# Patient Record
Sex: Female | Born: 1935
Health system: Southern US, Community
[De-identification: ages and names within clinical notes are randomized; demographics above are authoritative.]

## PROBLEM LIST (undated history)

## (undated) DIAGNOSIS — S3282XA Multiple fractures of pelvis without disruption of pelvic ring, initial encounter for closed fracture: Secondary | ICD-10-CM

## (undated) DIAGNOSIS — R110 Nausea: Secondary | ICD-10-CM

## (undated) DIAGNOSIS — H269 Unspecified cataract: Secondary | ICD-10-CM

## (undated) DIAGNOSIS — K219 Gastro-esophageal reflux disease without esophagitis: Secondary | ICD-10-CM

## (undated) DIAGNOSIS — I2510 Atherosclerotic heart disease of native coronary artery without angina pectoris: Secondary | ICD-10-CM

## (undated) DIAGNOSIS — E785 Hyperlipidemia, unspecified: Secondary | ICD-10-CM

## (undated) DIAGNOSIS — Z860100 Personal history of colon polyps, unspecified: Secondary | ICD-10-CM

## (undated) DIAGNOSIS — M810 Age-related osteoporosis without current pathological fracture: Secondary | ICD-10-CM

## (undated) DIAGNOSIS — F419 Anxiety disorder, unspecified: Secondary | ICD-10-CM

## (undated) DIAGNOSIS — Z87898 Personal history of other specified conditions: Secondary | ICD-10-CM

## (undated) DIAGNOSIS — K449 Diaphragmatic hernia without obstruction or gangrene: Secondary | ICD-10-CM

## (undated) DIAGNOSIS — R945 Abnormal results of liver function studies: Secondary | ICD-10-CM

## (undated) DIAGNOSIS — Z5189 Encounter for other specified aftercare: Secondary | ICD-10-CM

## (undated) DIAGNOSIS — I10 Essential (primary) hypertension: Secondary | ICD-10-CM

## (undated) DIAGNOSIS — R112 Nausea with vomiting, unspecified: Secondary | ICD-10-CM

## (undated) DIAGNOSIS — G8929 Other chronic pain: Secondary | ICD-10-CM

## (undated) DIAGNOSIS — Z8709 Personal history of other diseases of the respiratory system: Secondary | ICD-10-CM

## (undated) DIAGNOSIS — K5900 Constipation, unspecified: Secondary | ICD-10-CM

## (undated) DIAGNOSIS — Z9889 Other specified postprocedural states: Secondary | ICD-10-CM

## (undated) DIAGNOSIS — G459 Transient cerebral ischemic attack, unspecified: Secondary | ICD-10-CM

## (undated) DIAGNOSIS — K5730 Diverticulosis of large intestine without perforation or abscess without bleeding: Secondary | ICD-10-CM

## (undated) DIAGNOSIS — R39198 Other difficulties with micturition: Secondary | ICD-10-CM

## (undated) DIAGNOSIS — N183 Chronic kidney disease, stage 3 (moderate): Secondary | ICD-10-CM

## (undated) DIAGNOSIS — M549 Dorsalgia, unspecified: Secondary | ICD-10-CM

## (undated) HISTORY — DX: Atherosclerotic heart disease of native coronary artery without angina pectoris: I25.10

## (undated) HISTORY — DX: Essential (primary) hypertension: I10

## (undated) HISTORY — DX: Encounter for other specified aftercare: Z51.89

## (undated) HISTORY — DX: Multiple fractures of pelvis without disruption of pelvic ring, initial encounter for closed fracture: S32.82XA

## (undated) HISTORY — DX: Diaphragmatic hernia without obstruction or gangrene: K44.9

## (undated) HISTORY — DX: Unspecified cataract: H26.9

## (undated) HISTORY — DX: Anxiety disorder, unspecified: F41.9

## (undated) HISTORY — DX: Hyperlipidemia, unspecified: E78.5

## (undated) HISTORY — PX: APPENDECTOMY: SHX54

## (undated) HISTORY — PX: HEMORRHOID SURGERY: SHX153

## (undated) HISTORY — DX: Gastro-esophageal reflux disease without esophagitis: K21.9

## (undated) HISTORY — PX: BLADDER REPAIR: SHX76

## (undated) HISTORY — PX: FIXATION KYPHOPLASTY LUMBAR SPINE: SHX1642

## (undated) HISTORY — PX: UPPER GASTROINTESTINAL ENDOSCOPY: SHX188

## (undated) HISTORY — PX: CHOLECYSTECTOMY: SHX55

## (undated) HISTORY — PX: BACK SURGERY: SHX140

---

## 1973-05-09 HISTORY — PX: TOTAL ABDOMINAL HYSTERECTOMY: SHX209

## 1993-05-09 DIAGNOSIS — G459 Transient cerebral ischemic attack, unspecified: Secondary | ICD-10-CM

## 1993-05-09 HISTORY — DX: Transient cerebral ischemic attack, unspecified: G45.9

## 1998-06-11 ENCOUNTER — Other Ambulatory Visit: Admission: RE | Admit: 1998-06-11 | Discharge: 1998-06-11 | Payer: Self-pay | Admitting: Obstetrics & Gynecology

## 1999-05-19 ENCOUNTER — Encounter: Payer: Self-pay | Admitting: Internal Medicine

## 1999-05-19 ENCOUNTER — Ambulatory Visit (HOSPITAL_COMMUNITY): Admission: RE | Admit: 1999-05-19 | Discharge: 1999-05-19 | Payer: Self-pay | Admitting: Internal Medicine

## 1999-05-20 ENCOUNTER — Encounter: Payer: Self-pay | Admitting: Emergency Medicine

## 1999-05-20 ENCOUNTER — Inpatient Hospital Stay (HOSPITAL_COMMUNITY): Admission: EM | Admit: 1999-05-20 | Discharge: 1999-05-22 | Payer: Self-pay | Admitting: Emergency Medicine

## 1999-08-31 ENCOUNTER — Other Ambulatory Visit: Admission: RE | Admit: 1999-08-31 | Discharge: 1999-08-31 | Payer: Self-pay | Admitting: Obstetrics and Gynecology

## 1999-09-01 ENCOUNTER — Encounter: Payer: Self-pay | Admitting: Obstetrics and Gynecology

## 1999-09-01 ENCOUNTER — Ambulatory Visit (HOSPITAL_COMMUNITY): Admission: RE | Admit: 1999-09-01 | Discharge: 1999-09-01 | Payer: Self-pay | Admitting: Obstetrics and Gynecology

## 2000-07-04 ENCOUNTER — Inpatient Hospital Stay (HOSPITAL_COMMUNITY): Admission: EM | Admit: 2000-07-04 | Discharge: 2000-07-08 | Payer: Self-pay | Admitting: Emergency Medicine

## 2000-07-04 ENCOUNTER — Encounter: Payer: Self-pay | Admitting: Emergency Medicine

## 2000-07-06 ENCOUNTER — Encounter: Payer: Self-pay | Admitting: Specialist

## 2000-07-28 ENCOUNTER — Encounter: Payer: Self-pay | Admitting: Specialist

## 2000-07-28 ENCOUNTER — Encounter: Admission: RE | Admit: 2000-07-28 | Discharge: 2000-07-28 | Payer: Self-pay | Admitting: Specialist

## 2000-10-18 ENCOUNTER — Encounter: Payer: Self-pay | Admitting: Emergency Medicine

## 2000-10-18 ENCOUNTER — Inpatient Hospital Stay (HOSPITAL_COMMUNITY): Admission: EM | Admit: 2000-10-18 | Discharge: 2000-10-20 | Payer: Self-pay | Admitting: Emergency Medicine

## 2000-11-06 HISTORY — PX: EYE SURGERY: SHX253

## 2000-12-26 ENCOUNTER — Ambulatory Visit (HOSPITAL_COMMUNITY): Admission: RE | Admit: 2000-12-26 | Discharge: 2000-12-26 | Payer: Self-pay | Admitting: Family Medicine

## 2000-12-26 ENCOUNTER — Encounter: Payer: Self-pay | Admitting: Gastroenterology

## 2000-12-26 ENCOUNTER — Encounter (INDEPENDENT_AMBULATORY_CARE_PROVIDER_SITE_OTHER): Payer: Self-pay | Admitting: Specialist

## 2001-05-29 ENCOUNTER — Encounter: Payer: Self-pay | Admitting: Internal Medicine

## 2001-05-29 ENCOUNTER — Ambulatory Visit (HOSPITAL_COMMUNITY): Admission: RE | Admit: 2001-05-29 | Discharge: 2001-05-29 | Payer: Self-pay | Admitting: Internal Medicine

## 2001-11-16 ENCOUNTER — Ambulatory Visit (HOSPITAL_COMMUNITY): Admission: RE | Admit: 2001-11-16 | Discharge: 2001-11-17 | Payer: Self-pay | Admitting: *Deleted

## 2001-11-16 HISTORY — PX: CORONARY ANGIOPLASTY: SHX604

## 2002-08-02 ENCOUNTER — Encounter: Payer: Self-pay | Admitting: Internal Medicine

## 2002-08-02 ENCOUNTER — Ambulatory Visit (HOSPITAL_COMMUNITY): Admission: RE | Admit: 2002-08-02 | Discharge: 2002-08-02 | Payer: Self-pay | Admitting: Internal Medicine

## 2002-08-06 ENCOUNTER — Encounter: Admission: RE | Admit: 2002-08-06 | Discharge: 2002-08-06 | Payer: Self-pay | Admitting: Internal Medicine

## 2002-08-06 ENCOUNTER — Encounter: Payer: Self-pay | Admitting: Internal Medicine

## 2002-12-01 ENCOUNTER — Encounter: Payer: Self-pay | Admitting: Emergency Medicine

## 2002-12-01 ENCOUNTER — Observation Stay (HOSPITAL_COMMUNITY): Admission: EM | Admit: 2002-12-01 | Discharge: 2002-12-01 | Payer: Self-pay | Admitting: *Deleted

## 2003-03-28 ENCOUNTER — Encounter: Admission: RE | Admit: 2003-03-28 | Discharge: 2003-03-28 | Payer: Self-pay | Admitting: Specialist

## 2003-04-24 ENCOUNTER — Encounter: Admission: RE | Admit: 2003-04-24 | Discharge: 2003-04-24 | Payer: Self-pay | Admitting: Specialist

## 2003-12-19 ENCOUNTER — Encounter: Admission: RE | Admit: 2003-12-19 | Discharge: 2003-12-19 | Payer: Self-pay | Admitting: Specialist

## 2004-08-25 ENCOUNTER — Ambulatory Visit: Payer: Self-pay | Admitting: Gastroenterology

## 2004-09-06 ENCOUNTER — Ambulatory Visit: Payer: Self-pay | Admitting: Gastroenterology

## 2004-09-15 ENCOUNTER — Ambulatory Visit: Payer: Self-pay | Admitting: Gastroenterology

## 2004-09-17 ENCOUNTER — Ambulatory Visit: Payer: Self-pay | Admitting: Gastroenterology

## 2004-10-13 ENCOUNTER — Inpatient Hospital Stay (HOSPITAL_COMMUNITY): Admission: RE | Admit: 2004-10-13 | Discharge: 2004-10-14 | Payer: Self-pay | Admitting: Neurosurgery

## 2004-12-10 ENCOUNTER — Ambulatory Visit (HOSPITAL_COMMUNITY): Admission: RE | Admit: 2004-12-10 | Discharge: 2004-12-10 | Payer: Self-pay | Admitting: Internal Medicine

## 2005-03-10 ENCOUNTER — Ambulatory Visit: Payer: Self-pay | Admitting: Cardiology

## 2005-03-11 ENCOUNTER — Ambulatory Visit: Payer: Self-pay

## 2005-03-24 ENCOUNTER — Ambulatory Visit: Payer: Self-pay | Admitting: Cardiology

## 2005-06-06 ENCOUNTER — Emergency Department (HOSPITAL_COMMUNITY): Admission: EM | Admit: 2005-06-06 | Discharge: 2005-06-06 | Payer: Self-pay | Admitting: Emergency Medicine

## 2005-08-09 ENCOUNTER — Encounter: Admission: RE | Admit: 2005-08-09 | Discharge: 2005-08-09 | Payer: Self-pay | Admitting: Internal Medicine

## 2005-08-15 ENCOUNTER — Encounter (HOSPITAL_COMMUNITY): Admission: RE | Admit: 2005-08-15 | Discharge: 2005-11-13 | Payer: Self-pay | Admitting: Internal Medicine

## 2005-08-31 ENCOUNTER — Ambulatory Visit: Payer: Self-pay | Admitting: Gastroenterology

## 2005-09-05 ENCOUNTER — Ambulatory Visit (HOSPITAL_COMMUNITY): Admission: RE | Admit: 2005-09-05 | Discharge: 2005-09-05 | Payer: Self-pay | Admitting: Gastroenterology

## 2005-09-08 ENCOUNTER — Ambulatory Visit: Payer: Self-pay | Admitting: Cardiology

## 2005-09-14 ENCOUNTER — Ambulatory Visit: Payer: Self-pay | Admitting: Gastroenterology

## 2005-09-16 ENCOUNTER — Ambulatory Visit: Payer: Self-pay | Admitting: Gastroenterology

## 2005-09-23 ENCOUNTER — Ambulatory Visit: Payer: Self-pay | Admitting: Gastroenterology

## 2005-10-12 ENCOUNTER — Ambulatory Visit: Payer: Self-pay | Admitting: Gastroenterology

## 2005-11-21 ENCOUNTER — Ambulatory Visit: Payer: Self-pay | Admitting: Gastroenterology

## 2005-11-29 ENCOUNTER — Ambulatory Visit: Payer: Self-pay | Admitting: Gastroenterology

## 2006-02-09 ENCOUNTER — Ambulatory Visit: Payer: Self-pay | Admitting: Cardiology

## 2006-02-13 ENCOUNTER — Ambulatory Visit (HOSPITAL_COMMUNITY): Admission: RE | Admit: 2006-02-13 | Discharge: 2006-02-13 | Payer: Self-pay | Admitting: Internal Medicine

## 2006-08-28 ENCOUNTER — Ambulatory Visit: Payer: Self-pay | Admitting: Cardiology

## 2006-09-18 ENCOUNTER — Ambulatory Visit (HOSPITAL_COMMUNITY): Admission: RE | Admit: 2006-09-18 | Discharge: 2006-09-18 | Payer: Self-pay | Admitting: Internal Medicine

## 2006-09-25 ENCOUNTER — Emergency Department (HOSPITAL_COMMUNITY): Admission: EM | Admit: 2006-09-25 | Discharge: 2006-09-25 | Payer: Self-pay | Admitting: Family Medicine

## 2006-10-19 ENCOUNTER — Encounter: Admission: RE | Admit: 2006-10-19 | Discharge: 2006-10-19 | Payer: Self-pay | Admitting: Orthopedic Surgery

## 2007-03-14 ENCOUNTER — Ambulatory Visit: Payer: Self-pay | Admitting: Cardiology

## 2007-07-24 DIAGNOSIS — Z862 Personal history of diseases of the blood and blood-forming organs and certain disorders involving the immune mechanism: Secondary | ICD-10-CM

## 2007-07-25 ENCOUNTER — Ambulatory Visit: Payer: Self-pay | Admitting: Pulmonary Disease

## 2007-07-25 DIAGNOSIS — J209 Acute bronchitis, unspecified: Secondary | ICD-10-CM

## 2007-07-25 DIAGNOSIS — I10 Essential (primary) hypertension: Secondary | ICD-10-CM | POA: Insufficient documentation

## 2007-09-14 ENCOUNTER — Ambulatory Visit: Payer: Self-pay | Admitting: Cardiology

## 2007-09-20 ENCOUNTER — Ambulatory Visit: Payer: Self-pay

## 2008-01-02 ENCOUNTER — Encounter: Payer: Self-pay | Admitting: Pulmonary Disease

## 2008-01-16 ENCOUNTER — Encounter: Payer: Self-pay | Admitting: Pulmonary Disease

## 2008-01-16 ENCOUNTER — Encounter: Payer: Self-pay | Admitting: Gastroenterology

## 2008-01-24 ENCOUNTER — Encounter: Payer: Self-pay | Admitting: Gastroenterology

## 2008-01-24 ENCOUNTER — Encounter: Payer: Self-pay | Admitting: Pulmonary Disease

## 2008-01-31 ENCOUNTER — Ambulatory Visit: Payer: Self-pay | Admitting: Pulmonary Disease

## 2008-01-31 DIAGNOSIS — K440 Diaphragmatic hernia with obstruction, without gangrene: Secondary | ICD-10-CM | POA: Insufficient documentation

## 2008-03-03 ENCOUNTER — Ambulatory Visit: Payer: Self-pay | Admitting: Gastroenterology

## 2008-03-05 ENCOUNTER — Ambulatory Visit (HOSPITAL_COMMUNITY): Admission: RE | Admit: 2008-03-05 | Discharge: 2008-03-05 | Payer: Self-pay | Admitting: Gastroenterology

## 2008-03-06 ENCOUNTER — Encounter: Payer: Self-pay | Admitting: Gastroenterology

## 2008-03-19 ENCOUNTER — Emergency Department (HOSPITAL_COMMUNITY): Admission: EM | Admit: 2008-03-19 | Discharge: 2008-03-19 | Payer: Self-pay | Admitting: *Deleted

## 2008-03-22 ENCOUNTER — Emergency Department (HOSPITAL_COMMUNITY): Admission: EM | Admit: 2008-03-22 | Discharge: 2008-03-22 | Payer: Self-pay | Admitting: Emergency Medicine

## 2008-03-26 ENCOUNTER — Encounter: Admission: RE | Admit: 2008-03-26 | Discharge: 2008-03-26 | Payer: Self-pay | Admitting: Neurosurgery

## 2008-04-09 ENCOUNTER — Ambulatory Visit: Payer: Self-pay | Admitting: Pulmonary Disease

## 2008-04-21 ENCOUNTER — Ambulatory Visit: Payer: Self-pay | Admitting: Pulmonary Disease

## 2008-06-20 ENCOUNTER — Encounter (INDEPENDENT_AMBULATORY_CARE_PROVIDER_SITE_OTHER): Payer: Self-pay | Admitting: *Deleted

## 2008-06-20 ENCOUNTER — Emergency Department (HOSPITAL_COMMUNITY): Admission: EM | Admit: 2008-06-20 | Discharge: 2008-06-20 | Payer: Self-pay | Admitting: Emergency Medicine

## 2008-07-17 ENCOUNTER — Emergency Department (HOSPITAL_COMMUNITY): Admission: EM | Admit: 2008-07-17 | Discharge: 2008-07-17 | Payer: Self-pay | Admitting: Emergency Medicine

## 2008-07-25 ENCOUNTER — Ambulatory Visit: Payer: Self-pay | Admitting: Pulmonary Disease

## 2008-07-29 DIAGNOSIS — J4520 Mild intermittent asthma, uncomplicated: Secondary | ICD-10-CM

## 2008-08-04 ENCOUNTER — Encounter: Admission: RE | Admit: 2008-08-04 | Discharge: 2008-08-04 | Payer: Self-pay | Admitting: Neurosurgery

## 2008-08-28 DIAGNOSIS — K5730 Diverticulosis of large intestine without perforation or abscess without bleeding: Secondary | ICD-10-CM

## 2008-08-28 DIAGNOSIS — K222 Esophageal obstruction: Secondary | ICD-10-CM

## 2008-08-28 HISTORY — DX: Diverticulosis of large intestine without perforation or abscess without bleeding: K57.30

## 2008-09-01 ENCOUNTER — Ambulatory Visit: Payer: Self-pay | Admitting: Cardiology

## 2008-09-08 ENCOUNTER — Ambulatory Visit: Payer: Self-pay | Admitting: Cardiology

## 2008-09-10 LAB — CONVERTED CEMR LAB
Albumin: 3.8 g/dL (ref 3.5–5.2)
BUN: 17 mg/dL (ref 6–23)
Calcium: 9 mg/dL (ref 8.4–10.5)
Chloride: 107 meq/L (ref 96–112)
Cholesterol: 167 mg/dL (ref 0–200)
Creatinine, Ser: 1 mg/dL (ref 0.4–1.2)
GFR calc non Af Amer: 58.12 mL/min (ref >60)
Glucose, Bld: 86 mg/dL (ref 70–99)
LDL Cholesterol: 69 mg/dL (ref 0–99)
Potassium: 3.5 meq/L (ref 3.5–5.1)
VLDL: 22.8 mg/dL (ref 0.0–40.0)

## 2008-10-10 ENCOUNTER — Ambulatory Visit (HOSPITAL_COMMUNITY): Admission: RE | Admit: 2008-10-10 | Discharge: 2008-10-10 | Payer: Self-pay | Admitting: Internal Medicine

## 2008-11-19 ENCOUNTER — Ambulatory Visit (HOSPITAL_COMMUNITY): Admission: RE | Admit: 2008-11-19 | Discharge: 2008-11-19 | Payer: Self-pay | Admitting: Neurosurgery

## 2008-12-05 ENCOUNTER — Encounter: Payer: Self-pay | Admitting: Gastroenterology

## 2008-12-11 ENCOUNTER — Encounter: Payer: Self-pay | Admitting: Gastroenterology

## 2008-12-19 ENCOUNTER — Ambulatory Visit (HOSPITAL_COMMUNITY): Admission: RE | Admit: 2008-12-19 | Discharge: 2008-12-19 | Payer: Self-pay | Admitting: Internal Medicine

## 2008-12-25 ENCOUNTER — Ambulatory Visit: Payer: Self-pay | Admitting: Gastroenterology

## 2009-01-07 ENCOUNTER — Ambulatory Visit (HOSPITAL_COMMUNITY): Admission: RE | Admit: 2009-01-07 | Discharge: 2009-01-07 | Payer: Self-pay | Admitting: Internal Medicine

## 2009-01-19 ENCOUNTER — Encounter (INDEPENDENT_AMBULATORY_CARE_PROVIDER_SITE_OTHER): Payer: Self-pay | Admitting: *Deleted

## 2009-01-20 ENCOUNTER — Ambulatory Visit: Payer: Self-pay | Admitting: Gastroenterology

## 2009-01-20 ENCOUNTER — Telehealth: Payer: Self-pay | Admitting: Gastroenterology

## 2009-02-03 ENCOUNTER — Ambulatory Visit: Payer: Self-pay | Admitting: Gastroenterology

## 2009-02-03 ENCOUNTER — Ambulatory Visit (HOSPITAL_COMMUNITY): Admission: RE | Admit: 2009-02-03 | Discharge: 2009-02-03 | Payer: Self-pay | Admitting: Gastroenterology

## 2009-02-24 ENCOUNTER — Ambulatory Visit: Payer: Self-pay | Admitting: Gastroenterology

## 2009-02-26 ENCOUNTER — Encounter: Payer: Self-pay | Admitting: Gastroenterology

## 2009-02-26 LAB — CONVERTED CEMR LAB
Fecal Occult Blood: NEGATIVE
OCCULT 1: POSITIVE
OCCULT 2: POSITIVE
OCCULT 4: POSITIVE
OCCULT 5: NEGATIVE

## 2009-03-17 ENCOUNTER — Ambulatory Visit: Payer: Self-pay | Admitting: Gastroenterology

## 2009-03-25 ENCOUNTER — Telehealth: Payer: Self-pay | Admitting: Gastroenterology

## 2009-03-25 ENCOUNTER — Ambulatory Visit: Payer: Self-pay | Admitting: Gastroenterology

## 2009-03-26 ENCOUNTER — Ambulatory Visit (HOSPITAL_COMMUNITY): Admission: RE | Admit: 2009-03-26 | Discharge: 2009-03-26 | Payer: Self-pay | Admitting: Gastroenterology

## 2009-03-26 LAB — CONVERTED CEMR LAB
Basophils Absolute: 0.4 10*3/uL — ABNORMAL HIGH (ref 0.0–0.1)
Eosinophils Absolute: 0.5 10*3/uL (ref 0.0–0.7)
Eosinophils Relative: 9.5 % — ABNORMAL HIGH (ref 0.0–5.0)
Hemoglobin: 6.3 g/dL — CL (ref 12.0–15.0)
MCV: 61.7 fL — ABNORMAL LOW (ref 78.0–100.0)
Neutro Abs: 2.1 10*3/uL (ref 1.4–7.7)
Platelets: 426 10*3/uL — ABNORMAL HIGH (ref 150.0–400.0)
RDW: 18 % — ABNORMAL HIGH (ref 11.5–14.6)
WBC: 5.2 10*3/uL (ref 4.5–10.5)

## 2009-03-27 ENCOUNTER — Ambulatory Visit: Payer: Self-pay | Admitting: Gastroenterology

## 2009-03-30 ENCOUNTER — Encounter: Payer: Self-pay | Admitting: Gastroenterology

## 2009-03-31 ENCOUNTER — Ambulatory Visit: Payer: Self-pay | Admitting: Gastroenterology

## 2009-04-06 ENCOUNTER — Ambulatory Visit: Payer: Self-pay | Admitting: Cardiology

## 2009-04-13 ENCOUNTER — Encounter: Payer: Self-pay | Admitting: Gastroenterology

## 2009-04-14 ENCOUNTER — Telehealth: Payer: Self-pay | Admitting: Gastroenterology

## 2009-04-15 ENCOUNTER — Encounter (HOSPITAL_COMMUNITY): Admission: RE | Admit: 2009-04-15 | Discharge: 2009-05-08 | Payer: Self-pay | Admitting: Internal Medicine

## 2009-04-20 ENCOUNTER — Ambulatory Visit: Payer: Self-pay | Admitting: Gastroenterology

## 2009-04-27 ENCOUNTER — Ambulatory Visit (HOSPITAL_COMMUNITY): Admission: RE | Admit: 2009-04-27 | Discharge: 2009-04-27 | Payer: Self-pay | Admitting: Gastroenterology

## 2009-04-28 ENCOUNTER — Ambulatory Visit: Payer: Self-pay | Admitting: Pulmonary Disease

## 2009-04-28 ENCOUNTER — Telehealth: Payer: Self-pay | Admitting: Gastroenterology

## 2009-04-29 LAB — CONVERTED CEMR LAB
BUN: 17 mg/dL (ref 6–23)
Basophils Relative: 0.1 % (ref 0.0–3.0)
CO2: 27 meq/L (ref 19–32)
Calcium: 9.5 mg/dL (ref 8.4–10.5)
Chloride: 102 meq/L (ref 96–112)
Eosinophils Relative: 13.5 % — ABNORMAL HIGH (ref 0.0–5.0)
Lymphocytes Relative: 20.6 % (ref 12.0–46.0)
Monocytes Relative: 9.6 % (ref 3.0–12.0)
Neutro Abs: 7.7 10*3/uL (ref 1.4–7.7)
Neutrophils Relative %: 56.2 % (ref 43.0–77.0)

## 2009-04-30 ENCOUNTER — Telehealth: Payer: Self-pay | Admitting: Pulmonary Disease

## 2009-05-06 ENCOUNTER — Ambulatory Visit: Payer: Self-pay | Admitting: Gastroenterology

## 2009-05-11 ENCOUNTER — Ambulatory Visit (HOSPITAL_COMMUNITY): Admission: RE | Admit: 2009-05-11 | Discharge: 2009-05-11 | Payer: Self-pay | Admitting: Gastroenterology

## 2009-07-21 ENCOUNTER — Ambulatory Visit: Payer: Self-pay | Admitting: Critical Care Medicine

## 2009-07-22 LAB — CONVERTED CEMR LAB
Basophils Relative: 0.8 % (ref 0.0–3.0)
Eosinophils Absolute: 0.6 10*3/uL (ref 0.0–0.7)
Lymphocytes Relative: 27.5 % (ref 12.0–46.0)
Monocytes Absolute: 0.6 10*3/uL (ref 0.1–1.0)
Monocytes Relative: 8.6 % (ref 3.0–12.0)
Neutrophils Relative %: 54.4 % (ref 43.0–77.0)
Platelets: 275 10*3/uL (ref 150.0–400.0)
RBC: 4.96 M/uL (ref 3.87–5.11)
RDW: 19.2 % — ABNORMAL HIGH (ref 11.5–14.6)
WBC: 6.7 10*3/uL (ref 4.5–10.5)

## 2009-07-23 ENCOUNTER — Encounter (INDEPENDENT_AMBULATORY_CARE_PROVIDER_SITE_OTHER): Payer: Self-pay | Admitting: *Deleted

## 2009-09-02 ENCOUNTER — Ambulatory Visit: Payer: Self-pay | Admitting: Pulmonary Disease

## 2009-09-03 ENCOUNTER — Telehealth (INDEPENDENT_AMBULATORY_CARE_PROVIDER_SITE_OTHER): Payer: Self-pay | Admitting: *Deleted

## 2009-09-03 LAB — CONVERTED CEMR LAB
Basophils Absolute: 0 10*3/uL (ref 0.0–0.1)
Basophils Relative: 0.5 % (ref 0.0–3.0)
Eosinophils Absolute: 1.1 10*3/uL — ABNORMAL HIGH (ref 0.0–0.7)
HCT: 40.4 % (ref 36.0–46.0)
Lymphocytes Relative: 22.3 % (ref 12.0–46.0)
Lymphs Abs: 1.7 10*3/uL (ref 0.7–4.0)
MCV: 89.6 fL (ref 78.0–100.0)
Monocytes Absolute: 0.8 10*3/uL (ref 0.1–1.0)
Monocytes Relative: 10.2 % (ref 3.0–12.0)
Neutro Abs: 4 10*3/uL (ref 1.4–7.7)
Neutrophils Relative %: 52.3 % (ref 43.0–77.0)
Platelets: 320 10*3/uL (ref 150.0–400.0)
RBC: 4.5 M/uL (ref 3.87–5.11)
RDW: 15.7 % — ABNORMAL HIGH (ref 11.5–14.6)

## 2009-10-19 ENCOUNTER — Ambulatory Visit (HOSPITAL_COMMUNITY): Admission: RE | Admit: 2009-10-19 | Discharge: 2009-10-19 | Payer: Self-pay | Admitting: Internal Medicine

## 2010-01-20 ENCOUNTER — Encounter: Payer: Self-pay | Admitting: Pulmonary Disease

## 2010-01-25 ENCOUNTER — Ambulatory Visit: Payer: Self-pay | Admitting: Pulmonary Disease

## 2010-02-02 ENCOUNTER — Ambulatory Visit: Payer: Self-pay | Admitting: Pulmonary Disease

## 2010-02-16 ENCOUNTER — Inpatient Hospital Stay (HOSPITAL_COMMUNITY): Admission: EM | Admit: 2010-02-16 | Discharge: 2010-02-17 | Payer: Self-pay | Admitting: Emergency Medicine

## 2010-02-24 ENCOUNTER — Telehealth (INDEPENDENT_AMBULATORY_CARE_PROVIDER_SITE_OTHER): Payer: Self-pay | Admitting: *Deleted

## 2010-03-01 ENCOUNTER — Telehealth (INDEPENDENT_AMBULATORY_CARE_PROVIDER_SITE_OTHER): Payer: Self-pay | Admitting: *Deleted

## 2010-03-22 ENCOUNTER — Ambulatory Visit: Payer: Self-pay | Admitting: Pulmonary Disease

## 2010-04-26 ENCOUNTER — Ambulatory Visit: Payer: Self-pay | Admitting: Pulmonary Disease

## 2010-04-27 ENCOUNTER — Telehealth (INDEPENDENT_AMBULATORY_CARE_PROVIDER_SITE_OTHER): Payer: Self-pay | Admitting: *Deleted

## 2010-05-07 ENCOUNTER — Encounter: Payer: Self-pay | Admitting: Gastroenterology

## 2010-05-07 ENCOUNTER — Encounter (INDEPENDENT_AMBULATORY_CARE_PROVIDER_SITE_OTHER): Payer: Self-pay | Admitting: *Deleted

## 2010-05-07 ENCOUNTER — Encounter
Admission: RE | Admit: 2010-05-07 | Discharge: 2010-05-07 | Payer: Self-pay | Source: Home / Self Care | Attending: Internal Medicine | Admitting: Internal Medicine

## 2010-05-13 ENCOUNTER — Ambulatory Visit
Admission: RE | Admit: 2010-05-13 | Discharge: 2010-05-13 | Payer: Self-pay | Source: Home / Self Care | Attending: Gastroenterology | Admitting: Gastroenterology

## 2010-05-13 ENCOUNTER — Other Ambulatory Visit: Payer: Self-pay | Admitting: Gastroenterology

## 2010-05-13 DIAGNOSIS — M546 Pain in thoracic spine: Secondary | ICD-10-CM | POA: Insufficient documentation

## 2010-05-13 LAB — CBC WITH DIFFERENTIAL/PLATELET
Basophils Absolute: 0 10*3/uL (ref 0.0–0.1)
Basophils Relative: 0.3 % (ref 0.0–3.0)
Eosinophils Absolute: 0.7 10*3/uL (ref 0.0–0.7)
Eosinophils Relative: 8.2 % — ABNORMAL HIGH (ref 0.0–5.0)
HCT: 34.3 % — ABNORMAL LOW (ref 36.0–46.0)
Hemoglobin: 11.3 g/dL — ABNORMAL LOW (ref 12.0–15.0)
Lymphocytes Relative: 30.4 % (ref 12.0–46.0)
Lymphs Abs: 2.6 10*3/uL (ref 0.7–4.0)
MCHC: 33 g/dL (ref 30.0–36.0)
MCV: 75.9 fl — ABNORMAL LOW (ref 78.0–100.0)
Monocytes Absolute: 0.7 10*3/uL (ref 0.1–1.0)
Monocytes Relative: 8.5 % (ref 3.0–12.0)
Neutro Abs: 4.4 10*3/uL (ref 1.4–7.7)
Neutrophils Relative %: 52.6 % (ref 43.0–77.0)
Platelets: 478 10*3/uL — ABNORMAL HIGH (ref 150.0–400.0)
RBC: 4.52 Mil/uL (ref 3.87–5.11)
RDW: 16.1 % — ABNORMAL HIGH (ref 11.5–14.6)
WBC: 8.4 10*3/uL (ref 4.5–10.5)

## 2010-05-19 ENCOUNTER — Ambulatory Visit (HOSPITAL_COMMUNITY)
Admission: RE | Admit: 2010-05-19 | Discharge: 2010-05-19 | Payer: Self-pay | Source: Home / Self Care | Attending: Internal Medicine | Admitting: Internal Medicine

## 2010-05-19 LAB — HM MAMMOGRAPHY: HM Mammogram: NORMAL

## 2010-05-31 ENCOUNTER — Encounter: Payer: Self-pay | Admitting: Neurosurgery

## 2010-06-10 NOTE — Letter (Signed)
SummaryScience writer Pulmonary Care Appointment Letter  [facility] Pulmonary  [address]   [place], [place] [address]   Phone: [phone]  Fax: [phone]    01/20/2010 MRN: [id number]  [name] [name] [address] [place], [place]  [address]  Dear [name] [name],   Our office is attempting to contact you about an appointment.  Please call our office at [phone] to schedule this appointment with Dr.Tyse Auriemma.  Our registration staff is prepared to assist you with any questions you may have.    Thank you,   Nature conservation officer Pulmonary Division

## 2010-06-10 NOTE — Progress Notes (Signed)
Summary: results  Phone Note Call from Patient Call back at Home Phone [phone]   Caller: Patient Summary of Call: Patient phoned and stated that she spoke to [facility] in [facility] and was told to call here and ask for Triage. Patient would like to know the results for her x-ray yesterday. Pt can be reached at [phone]. Initial call taken by: Vedia Coffer,  April 27, 2010 2:44 PM  Follow-up for Phone Call        Called, spoke with pt.  She was informed of cxr results per append from 04/26/10 by RA.  She verbalized understanding. Follow-up by: Gweneth Dimitri RN,  April 27, 2010 3:45 PM

## 2010-06-10 NOTE — Assessment & Plan Note (Signed)
Summary: post hosp from oct/2011--td   Visit Type:  Hospital Follow-up Copy to:  n/a Primary Provider/Referring Provider:  Lucky Cowboy, MD  CC:  Pt c/o SOB with exertion and productive cough with yellow/green mucus.  History of Present Illness: 69 yowf never smoker with large hiatal hernia & recurrent asthmatic bronchitis , about twice/yr for many yrs. Lung parenchyma looks ok on Ct scans 6/08 & 12/10. PFTs nml in 12/09 & mild airway obstruction in 4/11. First presented 3/09 . CT neck for thyroid imaging s/o mediastinal contour abnormal. Ct chest >> large hiatal hernia & BL upper lobe hazy , ground glass opacity.   April 28, 2009 GI wu ongoing with Dr [name] for Fe def anemia requiring transfusions, small bowel AVMs suspected.  July 21, 2009 cc Acute visit-  c/o cough and wheezing  >> augmentin helped, enalapril changed to diovan  September 02, 2009 12:23 PM  reviewed PFTs, c/o dyspnea, HB nml, wheezing +, does not desatn on exertion.   January 25, 2010>>acute office visit. Complains of c.o cough with production with thick dark green, wheezing chest tightness, sob this am before taking neb. >>rx Avelox.   February 02, 2010--Presents for 1 week rov - Finished Doxy and Avelox today - On Pred 10mg    March 22, 2010 10:20 AM  Adm 10/11-12/11 for exacerbation - pred x 4 weeks, z-pak & avelox. Symbicort made her feel wierd, just completed long taper of prednisone  Denies chest pain,  orthopnea, hemoptysis, fever, n/v/d, edema, headache.   Preventive Screening-Counseling & Management  Alcohol-Tobacco     Smoking Status: never  Current Medications (verified): 1)  Omeprazole 20 Mg  Cpdr (Omeprazole) .... Take  One 30-60 Min Before First Meal of The Day 2)  Metoprolol Tartrate 50 Mg  Tabs (Metoprolol Tartrate) .... 1/2 Tab Two Times A Day 3)  Vitamin B-12 1000 Mcg Tabs (Cyanocobalamin) .... Once Daily 4)  Calcium 500 Mg Tabs (Calcium Carbonate) .... Take 1000mg  Daily 5)  [facility]  Garlic Odorless  Tabs (Garlic) .... Once Daily 6)  Loratadine 10 Mg Tabs (Loratadine) .... Once Daily 7)  Pravastatin Sodium 40 Mg Tabs (Pravastatin Sodium) .... At Bedtime 8)  Alprazolam 0.5 Mg Tabs (Alprazolam) .... Take 1 Tablet By Mouth Three Times A Day As Needed 9)  Vitamin D 1000 Unit  Tabs (Cholecalciferol) .... 6 Tabs By Mouth Once Daily 10)  Norvasc 10 Mg Tabs (Amlodipine Besylate) .... 1/2 Tab Once Daily 11)  Diovan Hct 160-25 Mg  Tabs (Valsartan-Hydrochlorothiazide) .... One By Mouth Daily 12)  Doxycycline Hyclate 100 Mg Tabs (Doxycycline Hyclate) .[name].. 1 Tab Two Times A Day For 5 Days ,then 1 By Mouth Once Daily For Infection 13)  Ipratropium-Albuterol 0.5-2.5 (3) Mg/1ml Soln (Ipratropium-Albuterol) .... I Vial Four Times A Day 14)  Hydromet 5-1.5 Mg/71ml Syrp (Hydrocodone-Homatropine) .[name].. 1-2 Tsp Every 4-6 Hr As Needed Cough/congestion 15)  Olive Leaf Extract 500 Mg Caps (Olive Leaf) .... Take 1 Capsule By Mouth Once A Day  Allergies (verified): 1)  ! Aspartame (Aspartame) 2)  ! * Mycins  Past History:  Past Medical History: Last updated: 07/21/2009 1. CAD:  LHC 2003 with 40% pLAD, 30% mLAD, 100% D1 (moderate vessel), 100% D2 (small vessel), 95% mid small OM2.  Pt had PTCA to D1.  Last myoview (5/09):  EF 66%, anteroseptal/apical infarct with minimal peri-infarct ischemia.  No significant change from 11/06.  Pt managed medically.  2.  Recurrent bronchitis: PFTs 12/09 FVC 97%, FEV1 101%, ratio 77%, TLC 109%.  No significant obstruction or restriction.  3.  HYPERTENSION (ICD-401.9)       - DC ACE July 21, 2009 pseudowheeze 4.  HYPERLIPIDEMIA (ICD-272.4) 5.  ESOPHAGEAL STRICTURE (ICD-530.3) 6.  DIVERTICULOSIS, COLON (ICD-562.10) 7.  HIATAL HERNIA WITH REFLUX (ICD-553.3) 8.  IRON DEFICIENCY ANEMIA, HX OF (ICD-V12.3): EGD, colonoscopy, and capsule endoscopy in fall 2010 unrevealing for source.  ? small bowel AVMs.     Social History: Last updated: 04/06/2009 Patient never  smoked.  She is retired since 2002, proior to that she worked at [employer].  She was exposed to acetone and other chemicals at her work. Married  Alcohol Use - no Drug Use - no  Review of Systems  The patient denies anorexia, fever, weight loss, weight gain, vision loss, decreased hearing, hoarseness, chest pain, syncope, dyspnea on exertion, peripheral edema, prolonged cough, headaches, hemoptysis, abdominal pain, melena, hematochezia, severe indigestion/heartburn, hematuria, muscle weakness, suspicious skin lesions, difficulty walking, depression, unusual weight change, abnormal bleeding, enlarged lymph nodes, and angioedema.    Vital Signs:  Patient profile:   75 year old female Height:      66 inches Weight:      180.4 pounds BMI:     29.22 O2 Sat:      96 % on Room air Temp:     97.5 degrees F oral Pulse rate:   100 / minute BP sitting:   120 / 64  (left arm) Cuff size:   large  Vitals Entered By: Zackery Barefoot CMA (March 22, 2010 9:42 AM)  O2 Flow:  Room air CC: Pt c/o SOB with exertion, productive cough with yellow/green mucus Comments Medications reviewed with patient Verified contact number and pharmacy with patient Zackery Barefoot CMA  March 22, 2010 9:42 AM    Physical Exam  Additional Exam:  Gen. Pleasant, well-nourished, in no distress ENT - no lesions, no post nasal drip Neck: No JVD, no thyromegaly, no carotid bruits Lungs: coarse BS , psuedowheezing has improved greatly.   Cardiovascular: Rhythm regular, heart sounds  normal, no murmurs or gallops, no peripheral edema Musculoskeletal: No deformities, no cyanosis or clubbing      Impression & Recommendations:  Problem # 1:  ASTHMA (ICD-493.90) Get back on advair 250/50 two times a day  Decrease nebuliser use to two times a day  OK to stay off prednisone Feel flares may be related to reflux from large hiatal hernia ? Would surgery help?  Problem # 2:  HIATAL HERNIA WITH REFLUX  (ICD-553.3)  Samples of dexilant instead of Omeprazole Ask Dr [name] about hernia surgery The following medications were removed from the medication list:    Omeprazole 20 Mg Cpdr (Omeprazole) .[name]... Take  one 30-60 min before first meal of the day Her updated medication list for this problem includes:    Dexilant 60 Mg Cpdr (Dexlansoprazole) ..... Once daily  Orders: Est. Patient Level III (40347) Gastroenterology Referral (GI)  Medications Added to Medication List This Visit: 1)  Ipratropium-albuterol 0.5-2.5 (3) Mg/30ml Soln (Ipratropium-albuterol) .... I vial four times a day 2)  Olive Leaf Extract 500 Mg Caps (Olive leaf) .... Take 1 capsule by mouth once a day 3)  Advair Diskus 250-50 Mcg/dose Aepb (Fluticasone-salmeterol) .... Two times a day 4)  Dexilant 60 Mg Cpdr (Dexlansoprazole) .... Once daily  Patient Instructions: 1)  Please schedule a follow-up appointment in 1 month  2)  Get back on advair 250/50 two times a day  3)  Decrease nebuliser use to two times a  day  4)  Samples of dexilant instead of Omeprazole 5)  Ask Dr [name] about hernia surgery Prescriptions: DEXILANT 60 MG CPDR (DEXLANSOPRAZOLE) once daily  #30 x 2   Entered and Authorized by:   Comer Locket Vassie Loll MD   Signed by:   Comer Locket Vassie Loll MD on 03/23/2010   Method used:   Electronically to        [facility] [phone]* (retail)       [address]       [place], [place]  [address]       Ph: [phone]       Fax: [phone]   RxID:   [id number] ADVAIR DISKUS 250-50 MCG/DOSE AEPB (FLUTICASONE-SALMETEROL) two times a day  #1 x 2   Entered and Authorized by:   Comer Locket Vassie Loll MD   Signed by:   Comer Locket Vassie Loll MD on 03/23/2010   Method used:   Electronically to        [facility] [phone]* (retail)       [address]       [place], [place]  [address]       Ph: [phone]       Fax: [phone]   RxID:   [id number]    Immunization History:  Influenza Immunization History:     Influenza:  historical (01/19/2010)

## 2010-06-10 NOTE — Assessment & Plan Note (Signed)
Summary: Pulmonary/ acute ext ov,  try off ACE   Copy to:  n/a Primary Provider/Referring Provider:  Lucky Cowboy, MD  CC:  Acute visit.  Pt c/o cough and wheezing x 1 wk.  Cough is prod with green sputum.  Wheezing  bothers her mainly at bedtime.  Pt also c/o facial pressure x 1 wk.  .  History of Present Illness: 37 yowf never smoker  with recurrent bronchitis , about twice/yr for many yrs.  First presented 3/09 with such a bronchitis, improved with steroids & ABx  9/09--Developed similar attack with cough - yellow sputum x 2 weeks, requiring zpak & prednsione. Labs wnl. CT neck for thyroid imaging s/o mediastinal contour abnormal. Ct chest >> large hiatal hernia & BL upper lobe hazy , ground glass opacity.   July 25, 2008 --follow up doing well w/ less coughing. advair once daily. Has had back pain over last several days, has ov w/ Dr. Jule Ser planned  April 28, 2009 GI wu ongoing with Dr [name] for Fe def anemia requiring transfusions, small bowel AVMs suspected. Since her visiton November 24, she received 3 units of packed red cells for a worsening anemia.  On December 7 her hemoglobin was 7.7.  She has not seen any overt bleeding. CXR 12//9 cardiomegaly, no infilatrtes c/o dyspnea & Wheezing > never completely better with or without  advair daily   July 21, 2009 cc Acute visit.  Pt c/o cough and wheezing worse than baseline x 2 wk.  Cough is prod with green sputum.  Wheezing  bothers her mainly at bedtime.  Pt also c/o facial pressure x 1 wk. sob with minimal activity. Pt denies any significant sore throat, dysphagia, itching, sneezing,  nasal congestion or excess secretions,  fever, chills, sweats, unintended wt loss, pleuritic or exertional cp, hempoptysis, change in activity tolerance  orthopnea pnd or leg swelling Pt also denies any obvious fluctuation in symptoms with weather or environmental change or other alleviating or aggravating factors.       Current Medications  (verified): 1)  Advair Diskus 250-50 Mcg/dose  Misc (Fluticasone-Salmeterol) .[name].. 1 Puff Two Times A Day 2)  Hydrochlorothiazide 25 Mg  Tabs (Hydrochlorothiazide) .... Every Morning 3)  Enalapril Maleate 20 Mg  Tabs (Enalapril Maleate) .... Once Daily 4)  Omeprazole 20 Mg  Cpdr (Omeprazole) .... Once Daily 5)  Metoprolol Tartrate 50 Mg  Tabs (Metoprolol Tartrate) .... 1/2 Tab Two Times A Day 6)  Aspirin Ec 325 Mg Tbec (Aspirin) .... 1/4 Once Daily 7)  Vitamin B-12 1000 Mcg Tabs (Cyanocobalamin) .... Once Daily 8)  Calcium 500 Mg Tabs (Calcium Carbonate) .... Take 1000mg  Daily 9)  [facility] Garlic Odorless  Tabs (Garlic) .... Once Daily 10)  Loratadine 10 Mg Tabs (Loratadine) .... Once Daily 11)  Pravastatin Sodium 40 Mg Tabs (Pravastatin Sodium) .... At Bedtime 12)  Alprazolam 0.5 Mg Tabs (Alprazolam) .... Take 1 Tablet By Mouth Three Times A Day As Needed 13)  Vitamin D 1000 Unit  Tabs (Cholecalciferol) .... 6 Tabs By Mouth Once Daily 14)  Norvasc 10 Mg Tabs (Amlodipine Besylate) .... 1/2 Tab Once Daily 15)  Feosol 200 (65 Fe) Mg Tabs (Ferrous Sulfate Dried) .... Take One Tab Twice A Day 16)  Nebulizer .... Take 1 Tablet By Mouth Three Times A Day  Allergies (verified): 1)  ! Aspartame (Aspartame) 2)  ! * Mycins  Past History:  Past Medical History: 1. CAD:  LHC 2003 with 40% pLAD, 30% mLAD, 100% D1 (moderate vessel),  100% D2 (small vessel), 95% mid small OM2.  Pt had PTCA to D1.  Last myoview (5/09):  EF 66%, anteroseptal/apical infarct with minimal peri-infarct ischemia.  No significant change from 11/06.  Pt managed medically.  2.  Recurrent bronchitis: PFTs 12/09 FVC 97%, FEV1 101%, ratio 77%, TLC 109%.  No significant obstruction or restriction.  3.  HYPERTENSION (ICD-401.9)       - DC ACE July 21, 2009 pseudowheeze 4.  HYPERLIPIDEMIA (ICD-272.4) 5.  ESOPHAGEAL STRICTURE (ICD-530.3) 6.  DIVERTICULOSIS, COLON (ICD-562.10) 7.  HIATAL HERNIA WITH REFLUX (ICD-553.3) 8.  IRON DEFICIENCY  ANEMIA, HX OF (ICD-V12.3): EGD, colonoscopy, and capsule endoscopy in fall 2010 unrevealing for source.  ? small bowel AVMs.     Vital Signs:  Patient profile:   75 year old female Weight:      177.38 pounds O2 Sat:      95 % on Room air Temp:     97.4 degrees F oral Pulse rate:   80 / minute BP sitting:   138 / 78  (left arm)  Vitals Entered By: Vernie Murders (July 21, 2009 2:44 PM)  O2 Flow:  Room air  Physical Exam  Additional Exam:  Gen. Pleasant, well-nourished, in no distress ENT - no lesions, no post nasal drip Neck: No JVD, no thyromegaly, no carotid bruits Lungs: no use of accessory muscles, no dullness to percussion, BL scattered  exp rhonchi and promient pseudowheeze improves with purse lip maneuver  Cardiovascular: Rhythm regular, heart sounds  normal, no murmurs or gallops, no peripheral edema Musculoskeletal: No deformities, no cyanosis or clubbing    hite Cell Count          6.7 K/uL                    4.5-10.5   Red Cell Count            4.96 Mil/uL                 3.87-5.11   Hemoglobin                14.1 g/dL                   16.1-09.6   Hematocrit                43.4 %                      36.0-46.0   MCV                       87.4 fl                     78.0-100.0   MCHC                      32.4 g/dL                   04.5-40.9   RDW                  [H]  19.2 H %                    11.5-14.6   Platelet Count            275.0 K/uL                  150.0-400.0  Neutrophil %              54.4 %                      43.0-77.0   Lymphocyte %              27.5 %                      12.0-46.0   Monocyte %                8.6 %                       3.0-12.0   Eosinophils%         [H]  8.7 %                       0.0-5.0   Basophils %               0.8 %                       0.0-3.0   Neutrophill Absolute      3.6 K/uL                    1.4-7.7   Lymphocyte Absolute       1.8 K/uL                    0.7-4.0   Monocyte Absolute         0.6 K/uL                     0.1-1.0  Eosinophils, Absolute                             0.6 K/uL                    0.0-0.7   Basophils Absolute        0.1 K/uL                    0.0-0.1   Impression & Recommendations:  Problem # 1:  BRONCHITIS, ACUTE (ICD-466.0)  The following medications were removed from the medication list:    Advair Diskus 250-50 Mcg/dose Misc (Fluticasone-salmeterol) .[name]... 1 puff two times a day Her updated medication list for this problem includes:    Augmentin 875-125 Mg Tabs (Amoxicillin-pot clavulanate) ..... By mouth twice daily    DDX of  difficult airways managment all start with A and  include Adherence, Ace Inhibitors, Acid Reflux, Active Sinus Disease, Alpha 1 Antitripsin deficiency, Anxiety masquerading as Airways dz,  ABPA,  allergy(esp in young), Aspiration (esp in elderly), Adverse effects of DPI,  Active smokers, plus one B  = Beta blocker use..    She has documented GERD and on ACE but also may now have Active sinusitis.   Each maintenance medication was reviewed in detail including most importantly the difference between maintenance and as needed and under what circumstances the prns are to be used. See instructions for specific recommendations   Problem # 2:  HYPERTENSION (ICD-401.9)  The following medications were removed from the medication list:    Hydrochlorothiazide 25 Mg Tabs (Hydrochlorothiazide) ..... Every morning    Enalapril Maleate 20 Mg Tabs (Enalapril maleate) ..... Once  daily Her updated medication list for this problem includes:    Metoprolol Tartrate 50 Mg Tabs (Metoprolol tartrate) .[name]... 1/2 tab two times a day    Norvasc 10 Mg Tabs (Amlodipine besylate) .[name]... 1/2 tab once daily    Diovan Hct 160-25 Mg Tabs (Valsartan-hydrochlorothiazide) ..... One by mouth daily  ACE inhibitors are problematic in  pts with airway complaints because  even experienced pulmonologists can't always distinguish ace effects from copd/asthma.  By themselves they don't  actually cause a problem, much like oxygen can't by itself start a fire, but they certainly serve as a powerful catalyst or enhancer for any "fire"  or inflammatory process in the upper airway, be it caused by an ET  tube or more commonly reflux (especially in the obese or pts with known GERD like this pt or who are on biphoshonates).  In the era of ARB near equivalency until we have a better handle on the reversibility of the airway problem, it just makes sense to avoid ace entirely in the short run and then decide later, having established a level of airway control using a reasonable limited regimen, whether to add back ace but even then being very careful to observe the pt for worsening airway control and number of meds used/ needed to control symptoms.  Note she has not historically needed a maint inhaler now has symptoms refractory to advair - will need to regroup off advair and see what her chronic needs are once off ace x 6 week trial.  Problem # 3:  IRON DEFICIENCY ANEMIA, HX OF (ICD-V12.3) RESOLVED  Medications Added to Medication List This Visit: 1)  Omeprazole 20 Mg Cpdr (Omeprazole) .... Take  one 30-60 min before first meal of the day 2)  Prednisone 10 Mg Tabs (Prednisone) .... 4 each am x 2days, 2x2days, 1x2days and stop 3)  Augmentin 875-125 Mg Tabs (Amoxicillin-pot clavulanate) .... By mouth twice daily 4)  Diovan Hct 160-25 Mg Tabs (Valsartan-hydrochlorothiazide) .... One by mouth daily  Other Orders: TLB-CBC Platelet - w/Differential (85025-CBCD) Est. Patient Level IV (09811)  Patient Instructions: 1)  Stop advair and Enalapril and HCTZ  2)  Diovan 160/25 mg one daily 3)  use neb every 4 hours if needed but you should find you need it less and less over the next few weeks 4)  Prednisone 10mg  4 each am x 2days, 2x2days, 1x2days and stop 5)  Augmentin 875 twice daily with bfast and supper with large glass of water cultured  yogurt for lunch 6)  GERD (REFLUX)  is a common cause of  respiratory symptoms. It commonly presents without heartburn and can be treated with medication, but also with lifestyle changes including avoidance of late meals, excessive alcohol, smoking cessation, and avoid fatty foods, chocolate, peppermint, colas, red wine, and acidic juices such as orange juice. NO MINT OR MENTHOL PRODUCTS SO NO COUGH DROPS  7)  USE SUGARLESS CANDY INSTEAD (jolley ranchers)  8)  NO OIL BASED VITAMINS  9)  Please schedule a follow-up appointment in 6 weeks, sooner if needed to see Dr Vassie Loll    10)  SCHEDULE PFT'S ON RETURN Prescriptions: AUGMENTIN 875-125 MG  TABS (AMOXICILLIN-POT CLAVULANATE) By mouth twice daily  #20 x 0   Entered and Authorized by:   Nyoka Cowden MD   Signed by:   Nyoka Cowden MD on 07/21/2009   Method used:   Electronically to        [facility] [phone]* (retail)  [address]       [place], [place]  [address]       Ph: [phone]       Fax: [phone]   RxID:   [id number] PREDNISONE 10 MG  TABS (PREDNISONE) 4 each am x 2days, 2x2days, 1x2days and stop  #14 x 0   Entered and Authorized by:   Nyoka Cowden MD   Signed by:   Nyoka Cowden MD on 07/21/2009   Method used:   Electronically to        [facility] [phone]* (retail)       [address]       [place], [place]  [address]       Ph: [phone]       Fax: [phone]   RxID:   [id number]   Appended Document: Pulmonary/ acute ext ov,  try off ACE needs PFT's on return to see Dr Vassie Loll   Appended Document: Pulmonary/ acute ext ov,  try off ACE LMOMTCB  Appended Document: Pulmonary/ acute ext ov,  try off ACE LMOMTCB  Appended Document: Pulmonary/ acute ext ov,  try off ACE Done, spoke with pt and sched for ov with PFT's with RA for 09/02/09.

## 2010-06-10 NOTE — Miscellaneous (Signed)
Summary: Orders Update pft charges  Clinical Lists Changes  Orders: Added new Service order of Carbon Monoxide diffusing w/capacity (94720) - Signed Added new Service order of Lung Volumes (94240) - Signed Added new Service order of Spirometry (Pre & Post) (94060) - Signed 

## 2010-06-10 NOTE — Progress Notes (Signed)
Summary: returned call  Phone Note Call from Patient   Caller: Patient Call For: [name] [name] Summary of Call: pt returned call from jes r/ results.  Initial call taken by: Tivis Ringer, CNA,  September 03, 2009 9:50 AM  Follow-up for Phone Call        pt aware of results Follow-up by: Philipp Deputy CMA,  September 03, 2009 10:11 AM

## 2010-06-10 NOTE — Assessment & Plan Note (Signed)
Summary: cough/ congested/ mbw   Copy to:  n/a Primary [name] [name]/Referring [name]:  Lucky Cowboy, MD  CC:  c.o cough with production with thick dark green, wheezing chest tightness, and sob this am before taking neb.  History of Present Illness: 75 yowf never smoker with large hiatal hernia & recurrent asthmatic bronchitis , about twice/yr for many yrs. Lung parenchyma looks ok on Ct scans 6/08 & 12/10. PFTs nml in 12/09 & mild airway obstruction in 4/11.  First presented 3/09 with such a bronchitis, improved with steroids & ABx9/09--Developed similar attack with cough - yellow sputum x 2 weeks, requiring zpak & prednsione. Labs wnl. CT neck for thyroid imaging s/o mediastinal contour abnormal. Ct chest >> large hiatal hernia & BL upper lobe hazy , ground glass opacity.   April 28, 2009 GI wu ongoing with Dr [name] for Fe def anemia requiring transfusions, small bowel AVMs suspected. - received 3 units of packed red cells for a worsening anemia, lowest hemoglobin was 7.7.  She has not seen any overt bleeding. CXR 12//9 cardiomegaly, no infilatrtes c/o dyspnea & Wheezing > never completely better with or without  advair daily >>good results with depot medrol.  July 21, 2009 cc Acute visit.  Pt c/o cough and wheezing worse than baseline x 2 wk.  Cough is prod with green sputum.  Wheezing  bothers her mainly at bedtime.  Pt also c/o facial pressure x 1 wk. sob with minimal activity >> augmentin helped, enalapril changed to diovan  September 02, 2009 12:23 PM  reviewed PFTs, c/o dyspnea, HB nml, wheezing +, does not desatn on exertion.   PAGE 2 >>>>>>>>>>>>>>>>>>>>>>>. January 25, 2010>>Presents for an acute office visit. Complains of c.o cough with production with thick dark green, wheezing chest tightness, sob this am before taking neb. Complains of 2 weeks ago symptoms/.  Seen 2 week ago by PCP tx w/ zpack.but no better, then given doxycyline 1 week ago. She does not feel any better.  Cough is keeping her up. Feels weak and w/ no energy. Denies chest pain,  orthopnea, hemoptysis, fever, n/v/d, edema, headache.   Current Medications (verified): 1)  Omeprazole 20 Mg  Cpdr (Omeprazole) .... Take  One 30-60 Min Before First Meal of The Day 2)  Metoprolol Tartrate 50 Mg  Tabs (Metoprolol Tartrate) .... 1/2 Tab Two Times A Day 3)  Vitamin B-12 1000 Mcg Tabs (Cyanocobalamin) .... Once Daily 4)  Calcium 500 Mg Tabs (Calcium Carbonate) .... Take 1000mg  Daily 5)  Cvs Garlic Odorless  Tabs (Garlic) .... Once Daily 6)  Loratadine 10 Mg Tabs (Loratadine) .... Once Daily 7)  Pravastatin Sodium 40 Mg Tabs (Pravastatin Sodium) .... At Bedtime 8)  Alprazolam 0.5 Mg Tabs (Alprazolam) .... Take 1 Tablet By Mouth Three Times A Day As Needed 9)  Vitamin D 1000 Unit  Tabs (Cholecalciferol) .... 6 Tabs By Mouth Once Daily 10)  Norvasc 10 Mg Tabs (Amlodipine Besylate) .... 1/2 Tab Once Daily 11)  Nebulizer .... Take 1 Tablet By Mouth Three Times A Day As Needed 12)  Diovan Hct 160-25 Mg  Tabs (Valsartan-Hydrochlorothiazide) .... One By Mouth Daily 13)  Albuterol Sulfate (2.5 Mg/73ml) 0.083% Nebu (Albuterol Sulfate) .... Use One Vial in Nebulizer Two Times A Day 14)  Doxycycline Hyclate 100 Mg Tabs (Doxycycline Hyclate) .[name].. 1 Tab Two Times A Day For 5 Days ,then 1 By Mouth Once Daily For Infection 15)  Ipratropium Bromide 0.02 % Soln (Ipratropium Bromide) .... Use One Vial in Nebulizer Two Times  A Day  Allergies: 1)  ! Aspartame (Aspartame) 2)  ! * Mycins  Past History:  Past Medical History: Last updated: 07/21/2009 1. CAD:  LHC 2003 with 40% pLAD, 30% mLAD, 100% D1 (moderate vessel), 100% D2 (small vessel), 95% mid small OM2.  Pt had PTCA to D1.  Last myoview (5/09):  EF 66%, anteroseptal/apical infarct with minimal peri-infarct ischemia.  No significant change from 11/06.  Pt managed medically.  2.  Recurrent bronchitis: PFTs 12/09 FVC 97%, FEV1 101%, ratio 77%, TLC 109%.  No significant  obstruction or restriction.  3.  HYPERTENSION (ICD-401.9)       - DC ACE July 21, 2009 pseudowheeze 4.  HYPERLIPIDEMIA (ICD-272.4) 5.  ESOPHAGEAL STRICTURE (ICD-530.3) 6.  DIVERTICULOSIS, COLON (ICD-562.10) 7.  HIATAL HERNIA WITH REFLUX (ICD-553.3) 8.  IRON DEFICIENCY ANEMIA, HX OF (ICD-V12.3): EGD, colonoscopy, and capsule endoscopy in fall 2010 unrevealing for source.  ? small bowel AVMs.     Past Surgical History: Last updated: 02/28/2008 Appendectomy Back surgery Cholecystectomy Total Abdominal Hysterectomy Bladder Repair Hemorrhoidectomy  Family History: Last updated: 07/25/2007 Colon cancer in her mother Heart disease in her father.  Social History: Last updated: 04/06/2009 Patient never smoked.  She is retired since 2002, proior to that she worked at [employer].  She was exposed to acetone and other chemicals at her work. Married  Alcohol Use - no Drug Use - no  Review of Systems      See HPI  Vital Signs:  Patient profile:   75 year old female Height:      66 inches Weight:      179.25 pounds BMI:     29.04 O2 Sat:      93 % on Room air Temp:     97.9 degrees F oral Pulse rate:   85 / minute BP sitting:   120 / 70  (right arm) Cuff size:   regular  Vitals Entered By: Kandice Hams CMA (January 25, 2010 11:03 AM)  O2 Flow:  Room air CC: c.o cough with production with thick dark green, wheezing chest tightness, sob this am before taking neb Comments pt had flu shot 01/19/10 Dr Oneta Rack office   Physical Exam  Additional Exam:  Gen. Pleasant, well-nourished, in no distress ENT - no lesions, no post nasal drip Neck: No JVD, no thyromegaly, no carotid bruits Lungs: coarse BS w/ scattered rhonchi, junky cough, few faint exp wheeizng on forced exp and upper airway psuedowheezing.  Cardiovascular: Rhythm regular, heart sounds  normal, no murmurs or gallops, no peripheral edema Musculoskeletal: No deformities, no cyanosis or clubbing       Impression & Recommendations:  Problem # 1:  BRONCHITIS, ACUTE (ICD-466.0) Recurrent bronchitis w/ slow to resolve flare.  Plan xray pending.  Stop Doxcycline Begin Avelox 400mg  once daily  Prednisone taper over next week.  Mucinex DM two times a day as needed cough/congestion  Hydromet 1-2 tsp every 4-6 hrs as needed cough.  follow up 1 week and as needed  Please contact office for sooner follow up if symptoms do not improve or worsen  Her updated medication list for this problem includes:    Albuterol Sulfate (2.5 Mg/70ml) 0.083% Nebu (Albuterol sulfate) ..... Use one vial in nebulizer two times a day    Doxycycline Hyclate 100 Mg Tabs (Doxycycline hyclate) .[name]... 1 tab two times a day for 5 days ,then 1 by mouth once daily for infection    Ipratropium Bromide 0.02 % Soln (Ipratropium bromide) ..... Use  one vial in nebulizer two times a day    Avelox 400 Mg Tabs (Moxifloxacin hcl) .[name]... 1 by mouth once daily    Hydromet 5-1.5 Mg/65ml Syrp (Hydrocodone-homatropine) .[name]... 1-2 tsp every 4-6 hr as needed cough/congestion  Orders: T-2 View CXR (71020TC) Est. Patient Level IV (86578)  Medications Added to Medication List This Visit: 1)  Doxycycline Hyclate 100 Mg Tabs (Doxycycline hyclate) .[name].. 1 tab two times a day for 5 days ,then 1 by mouth once daily for infection 2)  Ipratropium Bromide 0.02 % Soln (Ipratropium bromide) .... Use one vial in nebulizer two times a day 3)  Avelox 400 Mg Tabs (Moxifloxacin hcl) .[name].. 1 by mouth once daily 4)  Prednisone 10 Mg Tabs (Prednisone) .... 4 tabs for 3 days, then 3 tabs for 3 days, 2 tabs for 3 days, then 1 tab for 3 days, then stop 5)  Hydromet 5-1.5 Mg/44ml Syrp (Hydrocodone-homatropine) .[name].. 1-2 tsp every 4-6 hr as needed cough/congestion  Complete Medication List: 1)  Omeprazole 20 Mg Cpdr (Omeprazole) .... Take  one 30-60 min before first meal of the day 2)  Metoprolol Tartrate 50 Mg Tabs (Metoprolol tartrate) .... 1/2 tab two times a  day 3)  Vitamin B-12 1000 Mcg Tabs (Cyanocobalamin) .... Once daily 4)  Calcium 500 Mg Tabs (Calcium carbonate) .... Take 1000mg  daily 5)  Cvs Garlic Odorless Tabs (Garlic) .... Once daily 6)  Loratadine 10 Mg Tabs (Loratadine) .... Once daily 7)  Pravastatin Sodium 40 Mg Tabs (Pravastatin sodium) .... At bedtime 8)  Alprazolam 0.5 Mg Tabs (Alprazolam) .... Take 1 tablet by mouth three times a day as needed 9)  Vitamin D 1000 Unit Tabs (Cholecalciferol) .... 6 tabs by mouth once daily 10)  Norvasc 10 Mg Tabs (Amlodipine besylate) .... 1/2 tab once daily 11)  Nebulizer  .... Take 1 tablet by mouth three times a day as needed 12)  Diovan Hct 160-25 Mg Tabs (Valsartan-hydrochlorothiazide) .... One by mouth daily 13)  Albuterol Sulfate (2.5 Mg/35ml) 0.083% Nebu (Albuterol sulfate) .... Use one vial in nebulizer two times a day 14)  Doxycycline Hyclate 100 Mg Tabs (Doxycycline hyclate) .[name].. 1 tab two times a day for 5 days ,then 1 by mouth once daily for infection 15)  Ipratropium Bromide 0.02 % Soln (Ipratropium bromide) .... Use one vial in nebulizer two times a day 16)  Avelox 400 Mg Tabs (Moxifloxacin hcl) .[name].. 1 by mouth once daily 17)  Prednisone 10 Mg Tabs (Prednisone) .... 4 tabs for 3 days, then 3 tabs for 3 days, 2 tabs for 3 days, then 1 tab for 3 days, then stop 18)  Hydromet 5-1.5 Mg/56ml Syrp (Hydrocodone-homatropine) .[name].. 1-2 tsp every 4-6 hr as needed cough/congestion  Patient Instructions: 1)  Stop Doxcycline 2)  Begin Avelox 400mg  once daily  3)  Prednisone taper over next week.  4)  Mucinex DM two times a day as needed cough/congestion  5)  Hydromet 1-2 tsp every 4-6 hrs as needed cough.  6)  follow up 1 week and as needed  7)  Please contact office for sooner follow up if symptoms do not improve or worsen  Prescriptions: HYDROMET 5-1.5 MG/5ML SYRP (HYDROCODONE-HOMATROPINE) 1-2 tsp every 4-6 hr as needed cough/congestion  #8 oz x 0   Entered and Authorized by:   Rubye Oaks  NP   Signed by:   Tammy Parrett NP on 01/25/2010   Method used:   Print then Give to Patient   RxID:   [id number] PREDNISONE  10 MG TABS (PREDNISONE) 4 tabs for 3 days, then 3 tabs for 3 days, 2 tabs for 3 days, then 1 tab for 3 days, then stop  #30 x 0   Entered and Authorized by:   Rubye Oaks NP   Signed by:   Rubye Oaks NP on 01/25/2010   Method used:   Electronically to        [facility] [phone]* (retail)       [address]       [place], [place]  [address]       Ph: [phone]       Fax: [phone]   RxID:   [id number] AVELOX 400 MG TABS (MOXIFLOXACIN HCL) 1 by mouth once daily  #7 x 0   Entered and Authorized by:   Rubye Oaks NP   Signed by:   Rubye Oaks NP on 01/25/2010   Method used:   Electronically to        [facility] [phone]* (retail)       [address]       [place], [place]  [address]       Ph: [phone]       Fax: [phone]   RxID:   [id number]

## 2010-06-10 NOTE — Progress Notes (Signed)
Summary: nos appt  Phone Note Call from Patient   Caller: [email or web address]  Call For: parrett Summary of Call: Rsc nos from 10/21 with Dr. Vassie Loll on 11/14. Initial call taken by: Darletta Moll,  March 01, 2010 9:12 AM

## 2010-06-10 NOTE — Assessment & Plan Note (Signed)
Summary: acid reflux--ch.   History of Present Illness Visit Type: Follow-up Visit Primary GI MD: Melvia Heaps MD [facility] Primary Provider: Lucky Cowboy, MD Requesting Provider: Larina Earthly, MD  Chief Complaint: Pt c/o hiatal hernia bothering her and acid reflux  History of Present Illness:   [name] [name] has returned for evaluation of back pain.   She was last seen about a year ago for workup of an iron deficiency anemia.  In November, 2010 hemoglobin was 6.3.  Extensive workup including upper endoscopy, enteroscopy, colonoscopy, capsule study and arteriography were negative.  Hemoglobin  in April, 2011was 13.8.  She has been complaining of episodic severe back pain.  Pain is rather focal in the midportion of her back along the spine.  CT Scan done 3 days ago was unrevealing.  She has a large sliding hiatal hernia that alsowas seen by upper GI series and CT in the past  She denies chest  or abdominal pain or dysphagia.   GI Review of Systems    Reports acid reflux.      Denies abdominal pain, belching, bloating, chest pain, dysphagia with liquids, dysphagia with solids, heartburn, loss of appetite, nausea, vomiting, vomiting blood, weight loss, and  weight gain.        Denies anal fissure, black tarry stools, change in bowel habit, constipation, diarrhea, diverticulosis, fecal incontinence, heme positive stool, hemorrhoids, irritable bowel syndrome, jaundice, light color stool, liver problems, rectal bleeding, and  rectal pain.    Current Medications (verified): 1)  Metoprolol Tartrate 50 Mg  Tabs (Metoprolol Tartrate) .... 1/2 Tab Two Times A Day 2)  Vitamin B-12 1000 Mcg Tabs (Cyanocobalamin) .... Once Daily 3)  Calcium 500 Mg Tabs (Calcium Carbonate) .... Take 1000mg  Daily 4)  Cvs Garlic Odorless  Tabs (Garlic) .... Once Daily 5)  Loratadine 10 Mg Tabs (Loratadine) .... Once Daily 6)  Pravastatin Sodium 40 Mg Tabs (Pravastatin Sodium) .... At Bedtime 7)  Alprazolam 0.5 Mg Tabs  (Alprazolam) .... Take 1 Tablet By Mouth Three Times A Day As Needed 8)  Vitamin D 1000 Unit  Tabs (Cholecalciferol) .... 6 Tabs By Mouth Once Daily 9)  Norvasc 10 Mg Tabs (Amlodipine Besylate) .... 1/2 Tab Once Daily 10)  Diovan Hct 160-25 Mg  Tabs (Valsartan-Hydrochlorothiazide) .... One By Mouth Daily 11)  Ipratropium-Albuterol 0.5-2.5 (3) Mg/69ml Soln (Ipratropium-Albuterol) .... I Vial Three Times A Day 12)  Hydromet 5-1.5 Mg/74ml Syrp (Hydrocodone-Homatropine) .[name].. 1-2 Tsp Every 4-6 Hr As Needed Cough/congestion 13)  Olive Leaf Extract 500 Mg Caps (Olive Leaf) .... Take 1 Capsule By Mouth Once A Day 14)  Advair Diskus 250-50 Mcg/dose Aepb (Fluticasone-Salmeterol) .... Inhale 1 Puff Two Times A Day 15)  Prevacid 30 Mg Cpdr (Lansoprazole) .... One Tablet By Mouth in The Morning  Allergies (verified): 1)  ! Aspartame (Aspartame) 2)  ! * Mycins  Past History:  Past Medical History: 1. CAD:  LHC 2003 with 40% pLAD, 30% mLAD, 100% D1 (moderate vessel), 100% D2 (small vessel), 95% mid small OM2.  Pt had PTCA to D1.  Last myoview (5/09):  EF 66%, anteroseptal/apical infarct with minimal peri-infarct ischemia.  No significant change from 11/06.  Pt managed medically.  2.  Recurrent bronchitis: PFTs 12/09 FVC 97%, FEV1 101%, ratio 77%, TLC 109%.  No significant obstruction or restriction.  3.  HYPERTENSION (ICD-401.9)       - DC ACE July 21, 2009 pseudowheeze 4.  HYPERLIPIDEMIA (ICD-272.4) 5.  ESOPHAGEAL STRICTURE (ICD-530.3) 6.  DIVERTICULOSIS, COLON (ICD-562.10) 7.  HIATAL HERNIA WITH REFLUX (ICD-553.3) 8.  IRON DEFICIENCY ANEMIA, HX OF (ICD-V12.3): EGD, colonoscopy, and capsule endoscopy in fall 2010 unrevealing for source.  ? small bowel AVMs.  9. Gastritis  10.GERD  Past Surgical History: Reviewed history from 02/28/2008 and no changes required. Appendectomy Back surgery Cholecystectomy Total Abdominal Hysterectomy Bladder Repair Hemorrhoidectomy  Family History: Reviewed  history from 07/25/2007 and no changes required. Colon cancer in her mother Heart disease in her father.  Social History: Reviewed history from 04/06/2009 and no changes required. Patient never smoked.  She is retired since 2002, proior to that she worked at [employer].  She was exposed to acetone and other chemicals at her work. Married  Alcohol Use - no Drug Use - no  Review of Systems       The patient complains of allergy/sinus, arthritis/joint pain, back pain, cough, and voice change.  The patient denies anemia, anxiety-new, blood in urine, breast changes/lumps, change in vision, confusion, coughing up blood, depression-new, fainting, fatigue, fever, headaches-new, hearing problems, heart murmur, heart rhythm changes, itching, menstrual pain, muscle pains/cramps, night sweats, nosebleeds, pregnancy symptoms, shortness of breath, skin rash, sleeping problems, sore throat, swelling of feet/legs, swollen lymph glands, thirst - excessive , urination - excessive , urination changes/pain, urine leakage, and vision changes.    Vital Signs:  Patient profile:   75 year old female Height:      66 inches Weight:      178 pounds BMI:     28.83 BSA:     1.91 Pulse rate:   64 / minute Pulse rhythm:   regular BP sitting:   118 / 64  (left arm) Cuff size:   regular  Vitals Entered By: Ok Anis CMA (May 13, 2010 3:00 PM)  Physical Exam  Additional Exam:  on physical exam she is a well-developed well-nourished female  There is no point tenderness along her spine or in her posterior chest.  Abdomen is without masses tenderness organomegaly   Impression & Recommendations:  Problem # 1:  BACK PAIN, THORACIC REGION (ICD-724.1) This is most likely musculoskeletal pain.  It is unrelated to her hiatal hernia.  Recommendations #1 short trial of NSAIDs and local heat p.r.n. back pain  Problem # 2:  IRON DEFICIENCY ANEMIA, HX OF (ICD-V12.3) Presumably her anemia was related to  AVMs.  These were never actually seen but presumed to be present.  Recommendations #1 followup CBC Orders: TLB-CBC Platelet - w/Differential ([id number])  Patient Instructions: 1)  Copy sent to : Lucky Cowboy, MD, Larina Earthly, MD  2)  Go to the basement for labs 3)  The medication list was reviewed and reconciled.  All changed / newly prescribed medications were explained.  A complete medication list was provided to the patient / caregiver.

## 2010-06-10 NOTE — Progress Notes (Signed)
Summary: appt with TP----[id number]=2  Phone Note Call from Patient Call back at Home Phone [phone]   Caller: Patient Call For: parrett Summary of Call: Pt states she has appt with TP 10/21, wants to know if it's really necessary for her to keep this appt. Initial call taken by: Darletta Moll,  February 24, 2010 10:54 AM  Follow-up for Phone Call        per append to hosp d/c, RA wanted pt to be seen with TP for a post hosp f/u appt.  LMOM for pt TCB to inform her of this.  Aundra Millet Reynolds LPN  February 24, 2010 11:20 AM    lmomtcb to make pt aware to keep appt with TP on 10-21. Randell Loop [facility]  February 25, 2010 4:23 PM   Additional Follow-up for Phone Call Additional follow up Details #1::        LMOMTCB Vernie Murders  February 26, 2010 11:46 AM  scheduled pt for phosp pt could not come until after 03/20/10--made appt for 03/22/10 with dr Vassie Loll  Additional Follow-up by: Philipp Deputy CMA,  February 26, 2010 4:56 PM

## 2010-06-10 NOTE — Assessment & Plan Note (Signed)
Summary: 1 week return/mhh   Copy to:  n/a Primary Provider/Referring Provider:  Lucky Cowboy, MD  CC:  1 week rov - Finished Doxy and Avelox today - On Pred 10mg  once daily - Feels better - c/o increase anxiety - occas prod cough (yellow-green).  History of Present Illness: 72 yowf never smoker with large hiatal hernia & recurrent asthmatic bronchitis , about twice/yr for many yrs. Lung parenchyma looks ok on Ct scans 6/08 & 12/10. PFTs nml in 12/09 & mild airway obstruction in 4/11.  First presented 3/09 with such a bronchitis, improved with steroids & ABx9/09--Developed similar attack with cough - yellow sputum x 2 weeks, requiring zpak & prednsione. Labs wnl. CT neck for thyroid imaging s/o mediastinal contour abnormal. Ct chest >> large hiatal hernia & BL upper lobe hazy , ground glass opacity.   April 28, 2009 GI wu ongoing with Dr [name] for Fe def anemia requiring transfusions, small bowel AVMs suspected. - received 3 units of packed red cells for a worsening anemia, lowest hemoglobin was 7.7.  She has not seen any overt bleeding. CXR 12//9 cardiomegaly, no infilatrtes c/o dyspnea & Wheezing > never completely better with or without  advair daily >>good results with depot medrol.  July 21, 2009 cc Acute visit.  Pt c/o cough and wheezing worse than baseline x 2 wk.  Cough is prod with green sputum.  Wheezing  bothers her mainly at bedtime.  Pt also c/o facial pressure x 1 wk. sob with minimal activity >> augmentin helped, enalapril changed to diovan  September 02, 2009 12:23 PM  reviewed PFTs, c/o dyspnea, HB nml, wheezing +, does not desatn on exertion.   PAGE 2 >>>>>>>>>>>>>>>>>>>>>>>. January 25, 2010>>Presents for an acute office visit. Complains of c.o cough with production with thick dark green, wheezing chest tightness, sob this am before taking neb. Complains of 2 weeks ago symptoms/.  Seen 2 week ago by PCP tx w/ zpack.but no better, then given doxycyline 1 week ago. She  does not feel any better. Cough is keeping her up. Feels weak and w/ no energy. COPD flare>>rx Avelox.   February 02, 2010--Presents for 1 week rov - Finished Doxy and Avelox today - On Pred 10mg  once daily - Feels better - c/o increase anxiety on prednisone. Has an occasional  productive cough (yellow-green) but mostly clear to white now. Not back to baseline yet but much better than last visit 1 week ago. Denies chest pain,  orthopnea, hemoptysis, fever, n/v/d, edema, headache.   Current Medications (verified): 1)  Omeprazole 20 Mg  Cpdr (Omeprazole) .... Take  One 30-60 Min Before First Meal of The Day 2)  Metoprolol Tartrate 50 Mg  Tabs (Metoprolol Tartrate) .... 1/2 Tab Two Times A Day 3)  Vitamin B-12 1000 Mcg Tabs (Cyanocobalamin) .... Once Daily 4)  Calcium 500 Mg Tabs (Calcium Carbonate) .... Take 1000mg  Daily 5)  [facility] Garlic Odorless  Tabs (Garlic) .... Once Daily 6)  Loratadine 10 Mg Tabs (Loratadine) .... Once Daily 7)  Pravastatin Sodium 40 Mg Tabs (Pravastatin Sodium) .... At Bedtime 8)  Alprazolam 0.5 Mg Tabs (Alprazolam) .... Take 1 Tablet By Mouth Three Times A Day As Needed 9)  Vitamin D 1000 Unit  Tabs (Cholecalciferol) .... 6 Tabs By Mouth Once Daily 10)  Norvasc 10 Mg Tabs (Amlodipine Besylate) .... 1/2 Tab Once Daily 11)  Nebulizer .... Take 1 Tablet By Mouth Three Times A Day As Needed 12)  Diovan Hct 160-25 Mg  Tabs (Valsartan-Hydrochlorothiazide) .Marland KitchenMarland KitchenMarland Kitchen  One By Mouth Daily 13)  Albuterol Sulfate (2.5 Mg/54ml) 0.083% Nebu (Albuterol Sulfate) .... Use One Vial in Nebulizer Two Times A Day 14)  Doxycycline Hyclate 100 Mg Tabs (Doxycycline Hyclate) .[name].. 1 Tab Two Times A Day For 5 Days ,then 1 By Mouth Once Daily For Infection 15)  Ipratropium Bromide 0.02 % Soln (Ipratropium Bromide) .... Use One Vial in Nebulizer Two Times A Day 16)  Avelox 400 Mg Tabs (Moxifloxacin Hcl) .[name].. 1 By Mouth Once Daily 17)  Prednisone 10 Mg Tabs (Prednisone) .... 4 Tabs For 3 Days, Then 3 Tabs For  3 Days, 2 Tabs For 3 Days, Then 1 Tab For 3 Days, Then Stop 18)  Hydromet 5-1.5 Mg/33ml Syrp (Hydrocodone-Homatropine) .[name].. 1-2 Tsp Every 4-6 Hr As Needed Cough/congestion  Allergies (verified): 1)  ! Aspartame (Aspartame) 2)  ! * Mycins  Comments:  Nurse/Medical Assistant: The patient's medications and allergies were reviewed with the patient and were updated in the Medication and Allergy Lists.  Past History:  Past Medical History: Last updated: 07/21/2009 1. CAD:  LHC 2003 with 40% pLAD, 30% mLAD, 100% D1 (moderate vessel), 100% D2 (small vessel), 95% mid small OM2.  Pt had PTCA to D1.  Last myoview (5/09):  EF 66%, anteroseptal/apical infarct with minimal peri-infarct ischemia.  No significant change from 11/06.  Pt managed medically.  2.  Recurrent bronchitis: PFTs 12/09 FVC 97%, FEV1 101%, ratio 77%, TLC 109%.  No significant obstruction or restriction.  3.  HYPERTENSION (ICD-401.9)       - DC ACE July 21, 2009 pseudowheeze 4.  HYPERLIPIDEMIA (ICD-272.4) 5.  ESOPHAGEAL STRICTURE (ICD-530.3) 6.  DIVERTICULOSIS, COLON (ICD-562.10) 7.  HIATAL HERNIA WITH REFLUX (ICD-553.3) 8.  IRON DEFICIENCY ANEMIA, HX OF (ICD-V12.3): EGD, colonoscopy, and capsule endoscopy in fall 2010 unrevealing for source.  ? small bowel AVMs.     Past Surgical History: Last updated: 02/28/2008 Appendectomy Back surgery Cholecystectomy Total Abdominal Hysterectomy Bladder Repair Hemorrhoidectomy  Family History: Last updated: 07/25/2007 Colon cancer in her mother Heart disease in her father.  Social History: Last updated: 04/06/2009 Patient never smoked.  She is retired since 2002, proior to that she worked at [employer].  She was exposed to acetone and other chemicals at her work. Married  Alcohol Use - no Drug Use - no  Review of Systems      See HPI  Vital Signs:  Patient profile:   75 year old female Weight:      178.50 pounds O2 Sat:      97 % on Room air Temp:     97.3  degrees F oral Pulse rate:   75 / minute BP sitting:   122 / 70  (left arm) Cuff size:   regular  Vitals Entered By: Boone Master CNA/MA (February 02, 2010 10:28 AM)  O2 Flow:  Room air  Physical Exam  Additional Exam:  Gen. Pleasant, well-nourished, in no distress ENT - no lesions, no post nasal drip Neck: No JVD, no thyromegaly, no carotid bruits Lungs: coarse BS , psuedowheezing has improved greatly.   Cardiovascular: Rhythm regular, heart sounds  normal, no murmurs or gallops, no peripheral edema Musculoskeletal: No deformities, no cyanosis or clubbing      Impression & Recommendations:  Problem # 1:  BRONCHITIS, ACUTE (ICD-466.0)  Slow to resolve asthmatic bronchitis--improving with the addition of steroids Plan:  Finish Prednisone taper.  Mucinex DM two times a day as needed cough/congestion  Hydromet 1-2 tsp every 4-6 hrs as needed  cough.  follow up Dr. Vassie Loll 6-8 weeks and as needed  Her updated medication list for this problem includes:    Albuterol Sulfate (2.5 Mg/80ml) 0.083% Nebu (Albuterol sulfate) ..... Use one vial in nebulizer two times a day    Doxycycline Hyclate 100 Mg Tabs (Doxycycline hyclate) .[name]... 1 tab two times a day for 5 days ,then 1 by mouth once daily for infection    Ipratropium Bromide 0.02 % Soln (Ipratropium bromide) ..... Use one vial in nebulizer two times a day    Avelox 400 Mg Tabs (Moxifloxacin hcl) .[name]... 1 by mouth once daily    Hydromet 5-1.5 Mg/71ml Syrp (Hydrocodone-homatropine) .[name]... 1-2 tsp every 4-6 hr as needed cough/congestion  Orders: Est. Patient Level III (98119)  Complete Medication List: 1)  Omeprazole 20 Mg Cpdr (Omeprazole) .... Take  one 30-60 min before first meal of the day 2)  Metoprolol Tartrate 50 Mg Tabs (Metoprolol tartrate) .... 1/2 tab two times a day 3)  Vitamin B-12 1000 Mcg Tabs (Cyanocobalamin) .... Once daily 4)  Calcium 500 Mg Tabs (Calcium carbonate) .... Take 1000mg  daily 5)  [facility]vs Garlic Odorless Tabs  (Garlic) .... Once daily 6)  Loratadine 10 Mg Tabs (Loratadine) .... Once daily 7)  Pravastatin Sodium 40 Mg Tabs (Pravastatin sodium) .... At bedtime 8)  Alprazolam 0.5 Mg Tabs (Alprazolam) .... Take 1 tablet by mouth three times a day as needed 9)  Vitamin D 1000 Unit Tabs (Cholecalciferol) .... 6 tabs by mouth once daily 10)  Norvasc 10 Mg Tabs (Amlodipine besylate) .... 1/2 tab once daily 11)  Nebulizer  .... Take 1 tablet by mouth three times a day as needed 12)  Diovan Hct 160-25 Mg Tabs (Valsartan-hydrochlorothiazide) .... One by mouth daily 13)  Albuterol Sulfate (2.5 Mg/52ml) 0.083% Nebu (Albuterol sulfate) .... Use one vial in nebulizer two times a day 14)  Doxycycline Hyclate 100 Mg Tabs (Doxycycline hyclate) .[name].. 1 tab two times a day for 5 days ,then 1 by mouth once daily for infection 15)  Ipratropium Bromide 0.02 % Soln (Ipratropium bromide) .... Use one vial in nebulizer two times a day 16)  Avelox 400 Mg Tabs (Moxifloxacin hcl) .[name].. 1 by mouth once daily 17)  Prednisone 10 Mg Tabs (Prednisone) .... 4 tabs for 3 days, then 3 tabs for 3 days, 2 tabs for 3 days, then 1 tab for 3 days, then stop 18)  Hydromet 5-1.5 Mg/70ml Syrp (Hydrocodone-homatropine) .[name].. 1-2 tsp every 4-6 hr as needed cough/congestion  Patient Instructions: 1)  Finish Prednisone taper.  2)  Mucinex DM two times a day as needed cough/congestion  3)  Hydromet 1-2 tsp every 4-6 hrs as needed cough.  4)  follow up Dr. Vassie Loll 6-8 weeks and as needed

## 2010-06-10 NOTE — Assessment & Plan Note (Signed)
Summary: rov 1 month ///kp   Visit Type:  Follow-up Copy to:  n/a Primary Provider/Referring Provider:  Lucky Cowboy, MD  CC:  Pt here for 1 month follow up. Pt states breathing is better c/o right back pain all the time.  History of Present Illness: 35 yowf never smoker with large hiatal hernia & recurrent asthmatic bronchitis , about twice/yr for many yrs. Lung parenchyma looks ok on Ct scans 6/08 & 12/10. PFTs nml in 12/09 & mild airway obstruction in 4/11. First presented 3/09 . CT neck for thyroid imaging s/o mediastinal contour abnormal. Ct chest >> large hiatal hernia & BL upper lobe hazy , ground glass opacity.   April 28, 2009 GI wu ongoing with Dr [name] for Fe def anemia requiring transfusions, small bowel AVMs suspected.  July 21, 2009 cc Acute visit-  c/o cough and wheezing  >> augmentin helped, enalapril changed to diovan  January 25, 2010>>acute office visit. Complains of c.o cough with production with thick dark green, wheezing chest tightness, sob this am before taking neb. >>rx Avelox.   February 02, 2010--Presents for 1 week rov - Finished Doxy and Avelox today - On Pred 10mg    March 22, 2010 10:20 AM  Adm 10/11-12/11 for exacerbation - pred x 4 weeks, z-pak & avelox. Symbicort made her feel wierd, just completed long taper of prednisone  April 26, 2010 3:04 PM  rt chest hurting, sharp , 'all the time' , 'green phlegm', given avelox by PMD, breathing 'ok', no wheezing  Denies chest pain,  orthopnea, hemoptysis, fever, n/v/d, edema, headache.   Preventive Screening-Counseling & Management  Alcohol-Tobacco     Smoking Status: never  Current Medications (verified): 1)  Metoprolol Tartrate 50 Mg  Tabs (Metoprolol Tartrate) .... 1/2 Tab Two Times A Day 2)  Vitamin B-12 1000 Mcg Tabs (Cyanocobalamin) .... Once Daily 3)  Calcium 500 Mg Tabs (Calcium Carbonate) .... Take 1000mg  Daily 4)  [facility]vs Garlic Odorless  Tabs (Garlic) .... Once Daily 5)   Loratadine 10 Mg Tabs (Loratadine) .... Once Daily 6)  Pravastatin Sodium 40 Mg Tabs (Pravastatin Sodium) .... At Bedtime 7)  Alprazolam 0.5 Mg Tabs (Alprazolam) .... Take 1 Tablet By Mouth Three Times A Day As Needed 8)  Vitamin D 1000 Unit  Tabs (Cholecalciferol) .... 6 Tabs By Mouth Once Daily 9)  Norvasc 10 Mg Tabs (Amlodipine Besylate) .... 1/2 Tab Once Daily 10)  Diovan Hct 160-25 Mg  Tabs (Valsartan-Hydrochlorothiazide) .... One By Mouth Daily 11)  Ipratropium-Albuterol 0.5-2.5 (3) Mg/27ml Soln (Ipratropium-Albuterol) .... I Vial Three Times A Day 12)  Hydromet 5-1.5 Mg/50ml Syrp (Hydrocodone-Homatropine) .[name].. 1-2 Tsp Every 4-6 Hr As Needed Cough/congestion 13)  Olive Leaf Extract 500 Mg Caps (Olive Leaf) .... Take 1 Capsule By Mouth Once A Day 14)  Avelox 400 Mg Tabs (Moxifloxacin Hcl) .... Take 1 Tablet By Mouth Once A Day 15)  Advair Diskus 250-50 Mcg/dose Aepb (Fluticasone-Salmeterol) .... Inhale 1 Puff Two Times A Day  Allergies (verified): 1)  ! Aspartame (Aspartame) 2)  ! * Mycins  Past History:  Past Medical History: Last updated: 07/21/2009 1. CAD:  LHC 2003 with 40% pLAD, 30% mLAD, 100% D1 (moderate vessel), 100% D2 (small vessel), 95% mid small OM2.  Pt had PTCA to D1.  Last myoview (5/09):  EF 66%, anteroseptal/apical infarct with minimal peri-infarct ischemia.  No significant change from 11/06.  Pt managed medically.  2.  Recurrent bronchitis: PFTs 12/09 FVC 97%, FEV1 101%, ratio 77%, TLC 109%.  No significant  obstruction or restriction.  3.  HYPERTENSION (ICD-401.9)       - DC ACE July 21, 2009 pseudowheeze 4.  HYPERLIPIDEMIA (ICD-272.4) 5.  ESOPHAGEAL STRICTURE (ICD-530.3) 6.  DIVERTICULOSIS, COLON (ICD-562.10) 7.  HIATAL HERNIA WITH REFLUX (ICD-553.3) 8.  IRON DEFICIENCY ANEMIA, HX OF (ICD-V12.3): EGD, colonoscopy, and capsule endoscopy in fall 2010 unrevealing for source.  ? small bowel AVMs.     Social History: Last updated: 04/06/2009 Patient never smoked.    She is retired since 2002, proior to that she worked at [employer].  She was exposed to acetone and other chemicals at her work. Married  Alcohol Use - no Drug Use - no  Review of Systems  The patient denies anorexia, fever, weight loss, weight gain, vision loss, decreased hearing, hoarseness, chest pain, syncope, dyspnea on exertion, peripheral edema, prolonged cough, headaches, hemoptysis, abdominal pain, melena, hematochezia, severe indigestion/heartburn, hematuria, muscle weakness, suspicious skin lesions, difficulty walking, depression, unusual weight change, abnormal bleeding, enlarged lymph nodes, and angioedema.    Vital Signs:  Patient profile:   75 year old female Height:      66 inches Weight:      180 pounds BMI:     29.16 O2 Sat:      94 % on Room air Temp:     98.2 degrees F oral Pulse rate:   78 / minute BP sitting:   110 / 64  (left arm) Cuff size:   regular  Vitals Entered By: Zackery Barefoot CMA (April 26, 2010 2:48 PM)  O2 Flow:  Room air CC: Pt here for 1 month follow up. Pt states breathing is better c/o right back pain all the time Comments Medications reviewed with patient Verified contact number and pharmacy with patient Zackery Barefoot CMA  April 26, 2010 2:48 PM    Physical Exam  Additional Exam:  Gen. Pleasant, well-nourished, in no distress ENT - no lesions, no post nasal drip Neck: No JVD, no thyromegaly, no carotid bruits Lungs: coarse BS , psuedowheezing has improved greatly, point tenderness over rt infrascapoular chest wall  Cardiovascular: Rhythm regular, heart sounds  normal, no murmurs or gallops, no peripheral edema Musculoskeletal: No deformities, no cyanosis or clubbing      CXR  Procedure date:  04/26/2010  Findings:      Comparison: Chest x-ray of 02/16/2010   Findings: The lungs are clear and slightly hyperaerated.  There is a moderate sized hiatal hernia present.  Moderate cardiomegaly is noted as well.  No  acute bony abnormality.   IMPRESSION: Moderate cardiomegaly.  No active lung disease.  Moderate sized hiatal hernia.    Impression & Recommendations:  Problem # 1:  BRONCHITIS, ACUTE (ICD-466.0) Complete course of avelox CXR to r/o rib fracture/ doubt pneumonia The following medications were removed from the medication list:    Doxycycline Hyclate 100 Mg Tabs (Doxycycline hyclate) .[name]... 1 tab two times a day for 5 days ,then 1 by mouth once daily for infection Her updated medication list for this problem includes:    Ipratropium-albuterol 0.5-2.5 (3) Mg/19ml Soln (Ipratropium-albuterol) ..... I vial three times a day    Hydromet 5-1.5 Mg/49ml Syrp (Hydrocodone-homatropine) .[name]... 1-2 tsp every 4-6 hr as needed cough/congestion    Advair Diskus 250-50 Mcg/dose Aepb (Fluticasone-salmeterol) ..... Inhale 1 puff two times a day  Problem # 2:  HIATAL HERNIA WITH REFLUX (ICD-553.3) Ok to go back to omeprazole Ask Dr [name] about hernia surgery  Medications Added to Medication List This Visit: 1)  Ipratropium-albuterol 0.5-2.5 (3) Mg/51ml Soln (Ipratropium-albuterol) .... I vial three times a day 2)  Advair Diskus 250-50 Mcg/dose Aepb (Fluticasone-salmeterol) .... Inhale 1 puff two times a day  Other Orders: Est. Patient Level III (99213) T-2 View CXR (71020TC)  Patient Instructions: 1)  Copy sent to: Loree Fee, NP wiht Dr Oneta Rack 2)  Please schedule a follow-up appointment in 2 months  with TP 3)  A chest x-ray has been recommended.  Your imaging study may require preauthorization.  4)  Stay on advair & nebs three times a day 5)  OK to take vicodin two times a day as needed

## 2010-06-10 NOTE — Assessment & Plan Note (Signed)
Summary: ROV/ MBW   Visit Type:  Follow-up Copy to:  n/a Primary Provider/Referring Provider:  Lucky Cowboy, MD  CC:  pft result, pt has chest congestion the same, pt states couple nights ago she was feeling real bad for a couple hours, pt states she has some loss of appetite, pt states pnd, and phlem is thick and greenish and it changes in color from cream to brown color.  History of Present Illness: 75 yowf never smoker with large hiatal hernia & recurrent asthmatic bronchitis , about twice/yr for many yrs. Lung parenchyma looks ok on Ct scans 6/08 & 12/10. PFTs nml in 12/09 & mild airway obstruction in 4/11.  First presented 3/09 with such a bronchitis, improved with steroids & ABx9/09--Developed similar attack with cough - yellow sputum x 2 weeks, requiring zpak & prednsione. Labs wnl. CT neck for thyroid imaging s/o mediastinal contour abnormal. Ct chest >> large hiatal hernia & BL upper lobe hazy , ground glass opacity.   April 28, 2009 GI wu ongoing with Dr [name] for Fe def anemia requiring transfusions, small bowel AVMs suspected. - received 3 units of packed red cells for a worsening anemia, lowest hemoglobin was 7.7.  She has not seen any overt bleeding. CXR 12//9 cardiomegaly, no infilatrtes c/o dyspnea & Wheezing > never completely better with or without  advair daily >>good results with depot medrol. July 21, 2009 cc Acute visit.  Pt c/o cough and wheezing worse than baseline x 2 wk.  Cough is prod with green sputum.  Wheezing  bothers her mainly at bedtime.  Pt also c/o facial pressure x 1 wk. sob with minimal activity >> augmentin helped, enalapril changed to diovan September 02, 2009 12:23 PM  reviewed PFTs, c/o dyspnea, HB nml, wheezing +, does not desatn on exertion. Pt denies any significant sore throat, dysphagia, itching, sneezing,  nasal congestion or excess secretions,  fever, chills, sweats, unintended wt loss, pleuritic or exertional cp, hempoptysis, change in  activity tolerance  orthopnea pnd or leg swelling Pt also denies any obvious fluctuation in symptoms with weather or environmental change or other alleviating or aggravating factors.       Current Medications (verified): 1)  Omeprazole 20 Mg  Cpdr (Omeprazole) .... Take  One 30-60 Min Before First Meal of The Day 2)  Metoprolol Tartrate 50 Mg  Tabs (Metoprolol Tartrate) .... 1/2 Tab Two Times A Day 3)  Vitamin B-12 1000 Mcg Tabs (Cyanocobalamin) .... Once Daily 4)  Calcium 500 Mg Tabs (Calcium Carbonate) .... Take 1000mg  Daily 5)  Cvs Garlic Odorless  Tabs (Garlic) .... Once Daily 6)  Loratadine 10 Mg Tabs (Loratadine) .... Once Daily 7)  Pravastatin Sodium 40 Mg Tabs (Pravastatin Sodium) .... At Bedtime 8)  Alprazolam 0.5 Mg Tabs (Alprazolam) .... Take 1 Tablet By Mouth Three Times A Day As Needed 9)  Vitamin D 1000 Unit  Tabs (Cholecalciferol) .... 6 Tabs By Mouth Once Daily 10)  Norvasc 10 Mg Tabs (Amlodipine Besylate) .... 1/2 Tab Once Daily 11)  Nebulizer .... Take 1 Tablet By Mouth Three Times A Day As Needed 12)  Diovan Hct 160-25 Mg  Tabs (Valsartan-Hydrochlorothiazide) .... One By Mouth Daily  Allergies (verified): 1)  ! Aspartame (Aspartame) 2)  ! * Mycins  Past History:  Past Medical History: Last updated: 07/21/2009 1. CAD:  LHC 2003 with 40% pLAD, 30% mLAD, 100% D1 (moderate vessel), 100% D2 (small vessel), 95% mid small OM2.  Pt had PTCA to D1.  Last myoview (5/09):  EF 66%, anteroseptal/apical infarct with minimal peri-infarct ischemia.  No significant change from 11/06.  Pt managed medically.  2.  Recurrent bronchitis: PFTs 12/09 FVC 97%, FEV1 101%, ratio 77%, TLC 109%.  No significant obstruction or restriction.  3.  HYPERTENSION (ICD-401.9)       - DC ACE July 21, 2009 pseudowheeze 4.  HYPERLIPIDEMIA (ICD-272.4) 5.  ESOPHAGEAL STRICTURE (ICD-530.3) 6.  DIVERTICULOSIS, COLON (ICD-562.10) 7.  HIATAL HERNIA WITH REFLUX (ICD-553.3) 8.  IRON DEFICIENCY ANEMIA, HX OF  (ICD-V12.3): EGD, colonoscopy, and capsule endoscopy in fall 2010 unrevealing for source.  ? small bowel AVMs.     Social History: Last updated: 04/06/2009 Patient never smoked.  She is retired since 2002, proior to that she worked at [employer].  She was exposed to acetone and other chemicals at her work. Married  Alcohol Use - no Drug Use - no  Review of Systems       The patient complains of dyspnea on exertion.  The patient denies anorexia, fever, weight loss, weight gain, vision loss, decreased hearing, hoarseness, chest pain, syncope, peripheral edema, prolonged cough, headaches, hemoptysis, abdominal pain, melena, hematochezia, severe indigestion/heartburn, hematuria, muscle weakness, suspicious skin lesions, difficulty walking, depression, unusual weight change, and abnormal bleeding.    Vital Signs:  Patient profile:   75 year old female Height:      66 inches (167.64 cm) Weight:      173 pounds (78.64 kg) O2 Sat:      90 % on Room air Temp:     97.4 degrees F (36.33 degrees C) oral Pulse rate:   106 / minute BP sitting:   126 / 72  (right arm) Cuff size:   regular  Vitals Entered By: Carver Fila SMA (September 02, 2009 11:47 AM)  O2 Flow:  Room air  Serial Vital Signs/Assessments:  Comments: Ambulatory Pulse Oximetry  Resting; HR__97___    02 Sat__93%RA___  Lap1 (185 feet)   HR_99____   02 Sat__91%RA___ Lap2 (185 feet)   HR__118___   02 Sat__94%RA__    Lap3 (185 feet)   HR___119__   02 Sat__93% RA___  __X_Test Completed without Difficulty ___Test Stopped due to:   By: Zackery Barefoot CMA   CC: pft result, pt has chest congestion the same, pt states couple nights ago she was feeling real bad for a couple hours, pt states she has some loss of appetite, pt states pnd, phlem is thick and greenish and it changes in color from cream to brown color Comments meds and allegies updated Carver Fila SMA  September 02, 2009 11:52 AM    Physical Exam  Additional  Exam:  Gen. Pleasant, well-nourished, in no distress ENT - no lesions, no post nasal drip Neck: No JVD, no thyromegaly, no carotid bruits Lungs: no use of accessory muscles, no dullness to percussion, BL scattered  exp rhonchi  Cardiovascular: Rhythm regular, heart sounds  normal, no murmurs or gallops, no peripheral edema Musculoskeletal: No deformities, no cyanosis or clubbing      Impression & Recommendations:  Problem # 1:  ASTHMA (ICD-493.90) Although no BD response on today's PFTS , FEV1 of 74% is lower than 100% documented in 12/09 - this represents recent bronchitic episode a month ago - c/w asthmatic bronchitis, responsive to steroids. Trial of symbicort after she is done with current advair disk.  Problem # 2:  HIATAL HERNIA WITH REFLUX (ICD-553.3) Assessment: Comment Only  -wonder if this is contributing. Her updated medication list for this problem includes:  Omeprazole 20 Mg Cpdr (Omeprazole) .[name]... Take  one 30-60 min before first meal of the day  Orders: Est. Patient Level IV (16109) Prescription Created Electronically [phone])  Problem # 3:  HYPERTENSION (ICD-401.9)  stay off enalapril Her updated medication list for this problem includes:    Metoprolol Tartrate 50 Mg Tabs (Metoprolol tartrate) .[name]... 1/2 tab two times a day    Norvasc 10 Mg Tabs (Amlodipine besylate) .[name]... 1/2 tab once daily    Diovan Hct 160-25 Mg Tabs (Valsartan-hydrochlorothiazide) ..... One by mouth daily    Diovan Hct 160-12.5 Mg Tabs (Valsartan-hydrochlorothiazide) ..... Once daily  Orders: Est. Patient Level IV (09811) Prescription Created Electronically [id number])  Medications Added to Medication List This Visit: 1)  Nebulizer  .... Take 1 tablet by mouth three times a day as needed 2)  Diovan Hct 160-12.5 Mg Tabs (Valsartan-hydrochlorothiazide) .... Once daily  Patient Instructions: 1)  Copy sent to: Dr Oneta Rack 2)  Please schedule a follow-up appointment in 2 months 3)  Trial of  symbicort 160/4.5 2 puffs two times a day instead of advair 4)  Let me know if diovan - HCTZ not covered by insurance & we will substitute Prescriptions: DIOVAN HCT 160-12.5 MG TABS (VALSARTAN-HYDROCHLOROTHIAZIDE) once daily  #30 x 1   Entered and Authorized by:   Comer Locket Vassie Loll MD   Signed by:   Comer Locket Vassie Loll MD on 09/02/2009   Method used:   Electronically to        [facility] [phone]* (retail)       [address]       [place], [place]  [address]       Ph: [phone]       Fax: [phone]   RxID:   [id number]      Appended Document: ROV/ MBW LMOMTCB x 1. need to schedule follow up appointment with RA. jwr  Appended Document: ROV/ MBW LMOMTCB x 2. will mail letter for follow up. jwr

## 2010-06-10 NOTE — Letter (Signed)
Summary: Appointment - Reminder 2  [facility], Main Office  [address]   [place], [place] [address]   Phone: [phone]  Fax: [phone]     July 23, 2009 MRN: [id number]   [facility] [address] [place], [place]  [address]   Dear Ms. GAMBRILL,  Our records indicate that it is time to schedule a follow-up appointment with Dr. Shirlee Latch. It is very important that we reach you to schedule this appointment. We look forward to participating in your health care needs. Please contact us at the number listed above at your earliest convenience to schedule your appointment.  If you are unable to make an appointment at this time, give [place] a call so we can update our records.     Sincerely,   Migdalia Dk [facility] Scheduling Team

## 2010-06-14 ENCOUNTER — Telehealth (INDEPENDENT_AMBULATORY_CARE_PROVIDER_SITE_OTHER): Payer: Self-pay

## 2010-06-15 ENCOUNTER — Encounter (INDEPENDENT_AMBULATORY_CARE_PROVIDER_SITE_OTHER): Payer: Self-pay | Admitting: *Deleted

## 2010-06-15 ENCOUNTER — Other Ambulatory Visit: Payer: MEDICARE

## 2010-06-15 ENCOUNTER — Other Ambulatory Visit: Payer: Self-pay | Admitting: Gastroenterology

## 2010-06-15 DIAGNOSIS — Z862 Personal history of diseases of the blood and blood-forming organs and certain disorders involving the immune mechanism: Secondary | ICD-10-CM

## 2010-06-15 LAB — CBC WITH DIFFERENTIAL/PLATELET
Basophils Absolute: 0.1 10*3/uL (ref 0.0–0.1)
Basophils Relative: 0.8 % (ref 0.0–3.0)
Eosinophils Relative: 4.1 % (ref 0.0–5.0)
HCT: 34.3 % — ABNORMAL LOW (ref 36.0–46.0)
Hemoglobin: 10.9 g/dL — ABNORMAL LOW (ref 12.0–15.0)
Lymphocytes Relative: 24.1 % (ref 12.0–46.0)
Lymphs Abs: 2.2 10*3/uL (ref 0.7–4.0)
Monocytes Relative: 8 % (ref 3.0–12.0)
Neutro Abs: 5.6 10*3/uL (ref 1.4–7.7)
RBC: 4.4 Mil/uL (ref 3.87–5.11)
RDW: 21.8 % — ABNORMAL HIGH (ref 11.5–14.6)

## 2010-06-21 ENCOUNTER — Telehealth: Payer: Self-pay | Admitting: Gastroenterology

## 2010-06-24 NOTE — Progress Notes (Signed)
----   Converted from flag ---- ---- 05/14/2010 2:22 PM, Selinda Michaels RN wrote: CBC in one month ------------------------------       Additional Follow-up for Phone Call Additional follow up Details #2::    Called and left message for patient reminding her of appointment for repeat labwork. Follow-up by: Selinda Michaels RN,  June 14, 2010 8:39 AM

## 2010-06-25 ENCOUNTER — Ambulatory Visit: Payer: Self-pay | Admitting: Adult Health

## 2010-06-30 NOTE — Progress Notes (Signed)
Summary: Lab results  Phone Note Call from Patient Call back at Home Phone [phone]   Caller: Patient Call For: Dr. [name] Reason for Call: Lab or Test Results Summary of Call: Calling about her Lab results Initial call taken by: Karna Christmas,  June 21, 2010 2:14 PM  Follow-up for Phone Call        Dr [name], Pt calling about lab results.... Follow-up by: Merri Ray CMA Duncan Dull),  June 21, 2010 2:22 PM  Additional Follow-up for Phone Call Additional follow up Details #1::        Hg 10.9 which is stable.  Is she taking iron? Repeat CBC 6 weeks Additional Follow-up by: Louis Meckel MD,  June 21, 2010 4:28 PM    Additional Follow-up for Phone Call Additional follow up Details #2::    Patient states she is taking iron. Patient to repeat CBC in 6 weeks. Patient verbalized understanding. Follow-up by: Selinda Michaels RN,  June 22, 2010 9:08 AM  Additional Follow-up for Phone Call Additional follow up Details #3:: Details for Additional Follow-up Action Taken: ok Additional Follow-up by: Louis Meckel MD,  June 22, 2010 3:03 PM

## 2010-07-22 LAB — CBC
HCT: 38.9 % (ref 36.0–46.0)
Hemoglobin: 12.1 g/dL (ref 12.0–15.0)
Hemoglobin: 12.5 g/dL (ref 12.0–15.0)
MCH: 27.4 pg (ref 26.0–34.0)
MCHC: 32.1 g/dL (ref 30.0–36.0)
MCV: 84.6 fL (ref 78.0–100.0)
MCV: 85.1 fL (ref 78.0–100.0)
Platelets: 259 10*3/uL (ref 150–400)
RBC: 4.41 MIL/uL (ref 3.87–5.11)
RDW: 15.1 % (ref 11.5–15.5)
WBC: 5.6 10*3/uL (ref 4.0–10.5)

## 2010-07-22 LAB — BRAIN NATRIURETIC PEPTIDE: Pro B Natriuretic peptide (BNP): <30 pg/mL (ref 0.0–100.0)

## 2010-07-22 LAB — COMPREHENSIVE METABOLIC PANEL
ALT: 15 U/L (ref 0–35)
AST: 18 U/L (ref 0–37)
Albumin: 3.3 g/dL — ABNORMAL LOW (ref 3.5–5.2)
Alkaline Phosphatase: 35 U/L — ABNORMAL LOW (ref 39–117)
Potassium: 4.3 mEq/L (ref 3.5–5.1)
Sodium: 139 mEq/L (ref 135–145)
Total Protein: 5.7 g/dL — ABNORMAL LOW (ref 6.0–8.3)

## 2010-07-22 LAB — DIFFERENTIAL
Eosinophils Absolute: 0.9 10*3/uL — ABNORMAL HIGH (ref 0.0–0.7)
Eosinophils Relative: 13 % — ABNORMAL HIGH (ref 0–5)
Lymphocytes Relative: 36 % (ref 12–46)
Lymphs Abs: 2.7 10*3/uL (ref 0.7–4.0)
Monocytes Absolute: 0.9 10*3/uL (ref 0.1–1.0)
Monocytes Relative: 11 % (ref 3–12)

## 2010-07-22 LAB — CK TOTAL AND CKMB (NOT AT ARMC)
CK, MB: 1.6 ng/mL (ref 0.3–4.0)
Relative Index: INVALID (ref 0.0–2.5)
Total CK: 93 U/L (ref 7–177)

## 2010-07-22 LAB — LIPID PANEL
HDL: 63 mg/dL (ref >39)
Total CHOL/HDL Ratio: 2.4 RATIO

## 2010-07-22 LAB — GLUCOSE, CAPILLARY
Glucose-Capillary: 114 mg/dL — ABNORMAL HIGH (ref 70–99)
Glucose-Capillary: 138 mg/dL — ABNORMAL HIGH (ref 70–99)
Glucose-Capillary: 169 mg/dL — ABNORMAL HIGH (ref 70–99)

## 2010-07-22 LAB — HEPATIC FUNCTION PANEL
Albumin: 3.8 g/dL (ref 3.5–5.2)
Total Bilirubin: 1.4 mg/dL — ABNORMAL HIGH (ref 0.3–1.2)
Total Protein: 6.8 g/dL (ref 6.0–8.3)

## 2010-07-22 LAB — POCT CARDIAC MARKERS: Myoglobin, poc: 63.5 ng/mL (ref 12–200)

## 2010-07-22 LAB — LIPASE, BLOOD: Lipase: 17 U/L (ref 11–59)

## 2010-07-22 LAB — POCT I-STAT, CHEM 8
BUN: 7 mg/dL (ref 6–23)
Calcium, Ion: 1.1 mmol/L — ABNORMAL LOW (ref 1.12–1.32)
Creatinine, Ser: 1 mg/dL (ref 0.4–1.2)
Glucose, Bld: 108 mg/dL — ABNORMAL HIGH (ref 70–99)
Hemoglobin: 13.9 g/dL (ref 12.0–15.0)
TCO2: 26 mmol/L (ref 0–100)

## 2010-07-22 LAB — MAGNESIUM: Magnesium: 1.8 mg/dL (ref 1.5–2.5)

## 2010-07-22 LAB — TROPONIN I: Troponin I: 0.02 ng/mL (ref 0.00–0.06)

## 2010-07-25 LAB — PROTIME-INR
INR: 0.88 (ref 0.00–1.49)
Prothrombin Time: 11.9 seconds (ref 11.6–15.2)

## 2010-07-25 LAB — CBC
MCHC: 32 g/dL (ref 30.0–36.0)
RBC: 4.86 MIL/uL (ref 3.87–5.11)
WBC: 8.9 10*3/uL (ref 4.0–10.5)

## 2010-07-25 LAB — BASIC METABOLIC PANEL
CO2: 27 mEq/L (ref 19–32)
Calcium: 9.2 mg/dL (ref 8.4–10.5)
Creatinine, Ser: 0.85 mg/dL (ref 0.4–1.2)
GFR calc Af Amer: >60 mL/min (ref >60)

## 2010-08-02 ENCOUNTER — Other Ambulatory Visit: Payer: Self-pay | Admitting: *Deleted

## 2010-08-02 ENCOUNTER — Other Ambulatory Visit: Payer: MEDICARE

## 2010-08-02 DIAGNOSIS — Z862 Personal history of diseases of the blood and blood-forming organs and certain disorders involving the immune mechanism: Secondary | ICD-10-CM

## 2010-08-02 DIAGNOSIS — D649 Anemia, unspecified: Secondary | ICD-10-CM

## 2010-08-10 LAB — CROSSMATCH

## 2010-08-11 LAB — ABO/RH: ABO/RH(D): O POS

## 2010-08-11 LAB — CROSSMATCH
ABO/RH(D): O POS
Antibody Screen: NEGATIVE

## 2010-08-24 LAB — URINALYSIS, ROUTINE W REFLEX MICROSCOPIC
Bilirubin Urine: NEGATIVE
Ketones, ur: NEGATIVE mg/dL
Nitrite: NEGATIVE
Protein, ur: NEGATIVE mg/dL

## 2010-08-24 LAB — DIFFERENTIAL
Lymphs Abs: 1.6 10*3/uL (ref 0.7–4.0)
Monocytes Absolute: 0.6 10*3/uL (ref 0.1–1.0)
Monocytes Relative: 11 % (ref 3–12)
Neutro Abs: 2.5 10*3/uL (ref 1.7–7.7)
Neutrophils Relative %: 49 % (ref 43–77)

## 2010-08-24 LAB — COMPREHENSIVE METABOLIC PANEL
ALT: 16 U/L (ref 0–35)
Albumin: 4.1 g/dL (ref 3.5–5.2)
Alkaline Phosphatase: 73 U/L (ref 39–117)
BUN: 10 mg/dL (ref 6–23)
Calcium: 9.5 mg/dL (ref 8.4–10.5)
Glucose, Bld: 116 mg/dL — ABNORMAL HIGH (ref 70–99)
Potassium: 3.6 mEq/L (ref 3.5–5.1)
Sodium: 139 mEq/L (ref 135–145)
Total Protein: 7 g/dL (ref 6.0–8.3)

## 2010-08-24 LAB — CBC
Hemoglobin: 12 g/dL (ref 12.0–15.0)
MCHC: 33.4 g/dL (ref 30.0–36.0)
Platelets: 450 10*3/uL — ABNORMAL HIGH (ref 150–400)
RDW: 14.5 % (ref 11.5–15.5)

## 2010-09-21 NOTE — Assessment & Plan Note (Signed)
[facility]                            CARDIOLOGY OFFICE NOTE   [name] [name], [name] [name]                       MRN:          [id number]  DATE:03/14/2007                            DOB:          09/18/1935    REASON FOR VISIT:  Cardiac followup.   HISTORY OF PRESENT ILLNESS:  [name] [name] is doing well.  She denies having  any angina at this time and her electrocardiogram is stable showing a  sinus rhythm with nonspecific T wave changes.  She has gained some  weight since I last saw her and we did talk about this some today as it  relates to regular exercise.  We also discussed the [facility] Diet  briefly.  She is tolerating her medications and reports good cholesterol  control per blood work with [name] [name] over the last month.   ALLERGIES:  1. ASPARTAME.  2. MYCINS.   REVIEW OF SYSTEMS:  As described in the history of present illness.  She  does report getting over bronchitis in the last month.  Otherwise  negative.   MEDICATIONS:  1. Advair 250/50 b.i.d.  2. Norvasc 10 mg p.o. daily.  3. Hydrochlorothiazide 25 mg p.o. daily.  4. Prilosec 20 mg p.o. daily.  5. Metoprolol 50 mg p.o. b.i.d.  6. Enalapril 20 mg p.o. daily.  7. Aspirin 325 mg p.o. daily.  8. Vitamin B12 daily.  9. Calcium 1,000 mg daily.  10.Vitamin D and C supplements.  11.Crestor 10 mg p.o. every other day.  12.Garlic supplement.   PHYSICAL EXAMINATION:  VITAL SIGNS:  Blood pressure is 130/72, heart  rate is 76, weight is 172 pounds, up from 166.  GENERAL:  The patient is overweight in no acute distress.  NECK:  Reveals no elevated jugular venous pressure .  No loud bruits.  No thyromegaly.  LUNGS:  Clear without labored breathing.  CARDIAC:  Reveals a regular rate and rhythm.  No loud murmur or gallop.  EXTREMITIES:  Show no pitting edema.   IMPRESSION/RECOMMENDATION:  1. Stable coronary artery disease, status post angioplasty of the 1st      diagonal branch with ejection  fraction of 51%.  We will continue      medical therapy and I will see her back in approximately 6 months.      We might consider a followup Myoview at that time.  She will be 3      years out from her last assessment.  She will otherwise continue      medical therapy.  2. Hyperlipidemia on Crestor and is followed by [name] [name].  Goal LDL      should be around 70.     Jonelle Sidle, MD  Electronically Signed    SGM/MedQ  DD: 03/14/2007  DT: 03/14/2007  Job #: [id number]   cc:   [name] [name], M.D.

## 2010-09-21 NOTE — Assessment & Plan Note (Signed)
[facility]                            CARDIOLOGY OFFICE NOTE   ABBYE, LAO                       MRN:          [id number]  DATE:09/14/2007                            DOB:          11/14/1935    PRIMARY CARE PHYSICIAN:  Lucky Cowboy, M.D.   REASON FOR VISIT:  Cardiac follow-up.   HISTORY OF PRESENT ILLNESS:  Ms. Mast comes in for a routine visit.  She  had experienced somewhat more shortness of breath with activity but no  frank chest pain.  Her electrocardiogram shows sinus rhythm with low  voltage, which is old, and decreased R-wave progression.  She has  diffuse T-wave flattening.  I reviewed her medications, which are  outlined below.  We talked about a follow-up Myoview given her prior  intervention approximately 3 years ago to the first diagonal branch.   ALLERGIES:  No known drug allergies.   PRESENT MEDICATIONS:  1. Advair 250/50 mcg twice daily.  2. Norvasc 10 mg p.o. daily.  3. Hydrochlorothiazide 25 mg p.o. daily.  4. Prilosec 20 mg p.o. daily.  5. Metoprolol 50 mg p.o. b.i.d.  6. Enalapril 20 mg p.o. daily.  7. Aspirin 325 mg p.o. daily.  8. Vitamin B12.  9. Calcium 1000 mg p.o. daily.  10.Loratadine 10 mg p.o. daily.  11.Pravastatin 40 mg p.o. nightly.   REVIEW OF SYSTEMS:  As described in the history of present illness.  Otherwise negative.   EXAMINATION:  Blood pressure 133/82, heart rate is 78, weight is 170  pounds.  The patient is comfortable, in no acute distress.  HEENT:  Conjunctivae and lids normal.  Pharynx is clear.  NECK:  Supple.  No elevated jugular venous pressure.  No loud bruits.  No thyromegaly is noted.  LUNGS:  Clear without any labored breathing.  CARDIAC:  A regular rate and rhythm.  No loud murmur or gallop.  ABDOMEN:  Soft, nontender.  Normoactive bowel sounds.  EXTREMITIES:  No significant pitting edema.  Distal pulses are 2+.  SKIN:  Warm and dry.  MUSCULOSKELETAL:  NO kyphosis  noted.  NEUROPSYCHIATRIC:  Patient alert and oriented x3.  Affect is  appropriate.   IMPRESSION AND RECOMMENDATIONS:  Coronary artery disease, status post  previous angioplasty of the first diagonal branch with an ejection  fraction of 51%.  She has not had a recent ischemic follow-up over the  last 3 years.  We will plan an adenosine Myoview on medical therapy and  proceed from there.  If this is stable and low-risk, I would anticipate  follow-up over the next 6 months, otherwise we will see her back sooner.     Jonelle Sidle, MD  Electronically Signed    SGM/MedQ  DD: 09/14/2007  DT: 09/14/2007  Job #: [id number]   cc:   Lucky Cowboy, M.D.

## 2010-09-24 NOTE — Discharge Summary (Signed)
[place]  Patient:    [name] [name], ZALE Visit Number: [id number] MRN: [id number]          Service Type: CAT Location: [id number] Attending Physician:  Daisey Must Dictated by:   Joellyn Rued, P.A.-C. Admit Date:  11/16/2001 Disc. Date: 11/17/01   CC:         Michell Heinrich, M.D., primary care physician   Referring Physician Discharge [place]ma  DATE OF BIRTH:  October 12, 1935  ADMITTING PHYSICIAN:  Daisey Must, M.D.  SUMMARY OF HISTORY:  The patient is a 75 year old female who was referred by her primary care physician for risk stratification in light of multiple cardiac risk factors.  She was seen in the office on November 14, 2001 and underwent a stress test.  Within four minutes 30 seconds she achieved 92% of her predicted maximum heart rate at 6.4 METS.  She did have some mild associated chest discomfort which resolved in recovery.  Serial EKGs noted upsloping inferolateral ST segment depression with lateral ST segment flattening which persisted through recovery.  Stress images revealed an anterior apical defect with an EF of 61%.  The patient does report a 2-3 month history of progressive exertional dyspnea and fatigue with mild associated chest tightness, resolved with rest.  She also notes some left calf pain, particularly at night, but not necessarily associated with walking.  She has a history of hypertension and strong family history of premature heart disease. When seen in the office, her verapamil was discontinued and beta blocker was prescribed in its place.  Fasting lipids and TSH were also performed in the office but the results are unavailable at this time.  LABORATORY DATA:  Chest x-ray showed cardiomegaly without active disease. Sodium 137, potassium 3.6, BUN 16, creatinine 0.8.  PTT 20.3, PT 10.8.  H&H 13.6 and 40.9, normal indices, platelets 471, wbcs 8.1.  EKG shows normal sinus rhythm, decreased  voltage.  HOSPITAL COURSE:  The patient was brought in for outpatient cardiac catheterization.  This was performed by Dr. Gerri Spore.  According to his progress note she had moderate anterolateral akinesis and an EF of 60%.  Her right coronary artery was free of disease.  Her left main was normal.  She had a proximal mid 40% LAD lesion and mid distal 30% LAD lesion.  The diagonal was large and had a proximal 100% lesion with TIMI 2 flow.  Her diagonal two was small, also with 100% lesion with left-to-left collaterals.  OM was small with a mid 95% lesion and the circumflex had distal lesions less than 20%.  It was noted that her ejection fraction was preserved.  After reviewing the films, Dr. Gerri Spore performed angioplasty to the diagonal one, reducing the lesion from 100% to approximately 25%, restoring TIMI 3 flow.  Post procedure she was maintained on Integrilin for 18 hours.  Dr. Gerri Spore notes she will be on aspirin indefinitely and continue medical treatment for residual disease. Post sheath removal and bedrest, cardiac rehab assisted with education and ambulation.  On July 12, Dr. Graciela Husbands reviewed and felt that she could be discharged home.  He noted on discharge that she should begin a statin; thus Lipitor was started.  DISCHARGE DIAGNOSES: 1. Unstable angina with positive stress Cardiolite. 2. Coronary artery disease as previously described status post angioplasty in    the diagonal one as previously described. 3. History as previously.  DISPOSITION:  She is discharged home.  MEDICATIONS: 1. Coated aspirin 325 mg q.d.  2. She was given a new prescription for:    a. Lipitor 10 mg q.h.s.    b. Sublingual nitroglycerin p.r.n. 3. She was asked to continue:    a. Premarin 0.9 mg q.d.    b. Lopressor 50 mg one-half tablet b.i.d.    c. Accuretic 20/25 mg q.d.    d. Zyrtec 10 mg q.d.    e. Zantac 150 b.i.d.    f. Advair one puff b.i.d.  ACTIVITY:  She was advised no lifting,  driving, sexual activity, or heavy exertion for two days.  DIET:  Maintain low salt/fat/cholesterol diet.  WOUND CARE:  If she has any problems with her catheterization site she was asked to call us immediately.  FOLLOW-UP:  She will need fasting lipids and LFTs in six weeks, since we started her on a statin.  She will see Dr. Graciela Husbands in the office in approximately two to four weeks and she was asked to call for an appointment. Dictated by:   Joellyn Rued, P.A.-C. Attending Physician:  Daisey Must DD:  11/17/01 TD:  11/17/01 Job: [id number]

## 2010-09-24 NOTE — H&P (Signed)
[place]  Patient:    [name] [name]                          MRN: [id number] Adm. Date:  [id number] Attending:  Wilson Singer                         History and Physical  HISTORY OF PRESENT ILLNESS:  This very pleasant 75 year old lady came into my office with the sudden onset of sweating, abdominal pain, and when she was seen in the office she was found to have a systolic blood pressure of 100, and a pulse f 120 beats per minute, in sinus rhythm, with some rate-related ST changes laterally. The abdomen was questionably tender in the left lower quadrant with some rebound. She had investigations for similar symptoms in the past and this culminated in urinary catecholamines as well as 5-HIAA, and these have all been found to be in a normal range.  Also the symptoms are associated with flushing, palpitations, nausea, and fulminate diarrhea.  The working clinical diagnosis is carcinoid syndrome.  PAST MEDICAL HISTORY: 1. Hypertension. 2. Hyperlipidemia. 3. Asthma. 4. Fibrocystic breast disease. 5. History of duodenal ulcer. 6. History of TIA in 1994.  PAST SURGICAL HISTORY: 1. Hemorrhoidectomy in 1972. 2. Appendectomy in 1975. 3. Vaginal hysterectomy in 1978. 4. Lumbar laminectomy in 1989. 5. Colonoscopy by Dr. Katy Fitch. Buccini in 1990. 6. Laparoscopic cholecystectomy in 1998.  CURRENT MEDICATIONS: 1. Vaseretic 10-25 one tablet q.d. 2. Estradiol 1 mg q.d. 3. Singulair 10 mg q.d. 4. Verapamil SR 240 mg q.d. 5. Cimetidine 400 mg b.i.d. 5. Aspirin 81 mg q.d. 6. Vitamin A. 7. Vitamin C. 8. B12 tablets. 9. Serevent Diskus two puffs b.i.d. 10. Albuterol inhaler p.r.n. 11. Atrovent inhaler p.r.n. 12. Vanceril two puffs b.i.d.  ALLERGIES:  ERYTHROMYCIN AND BIAXIN which produce angioedema.  FAMILY HISTORY:  Father died at the age of 7 with a myocardial infarction. Mother died at the age of 85 from fungal meningitis and a history  of prolonged cancer nd strokes.  Had three brothers in good health.  Has a 71 year old son and a 36 year old daughter, all in good health.  SOCIAL HISTORY:  Has been married for 46 years.  Lives with her husband.  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  No history of alcohol abuse.  No history of tobacco abuse.  REVIEW OF SYSTEMS:  Negative except for the symptoms as mentioned above, for respiratory, neurological, musculoskeletal, psychiatric, and endocrine.  PHYSICAL EXAMINATION:  VITAL SIGNS:  Stabilized with a pulse of 80 beats per minute, blood pressure 140/80.  CARDIOVASCULAR:  Heart sounds present and normal with no murmurs or extra sounds. JVP is not raised.  There are no murmurs heard.  LUNGS:  Clear to auscultation and percussion.  ABDOMEN:  Soft now but was tender in the office.  No hepatosplenomegaly.  NEUROLOGIC:  Alert and oriented with no focal neurological signs.  INVESTIGATIONS:  Show a hypokalemia and a raised white blood cell count. Further laboratory work is pending.  IMPRESSION: 1. Possible carcinoid syndrome.  PLAN:  Will get a 24-hour urinary 5-HIAA and catacholemines again.  Get a gastroenterology opinion.  2. Hypertension. 3. Hyperlipidemia. 4. History of asthma. 5. Hypokalemia.  PLAN:  We will correct this with supplemental potassium.  Further recommendations will follow according to progress. DD:  05/20/99 TD:  05/20/99 Job: [id number]  [id number]

## 2010-09-24 NOTE — Assessment & Plan Note (Signed)
[facility]                              CARDIOLOGY OFFICE NOTE   ANYI, FELS                       MRN:          [id number]  DATE:02/09/2006                            DOB:          1936/03/30    PRIMARY CARE PHYSICIAN:  Lucky Cowboy, M.D.   REASON FOR VISIT:  Follow up coronary artery disease.   HISTORY OF PRESENT ILLNESS:  Ms. Fullman returns for a routine visit.  She  states that she is feeling much better with much improved energy.  She was  undergoing an evaluation for anemia the last time I saw her, and was  apparently not noted to have any major bleeding source but it was felt that  this was likely due to nonsteroidal anti-inflammatory drug use which has  subsequently been discontinued.  She states that her hemoglobin level has  improved and she has been on iron supplements.  She is not reporting any  angina or limiting dyspnea on exertion and has been much more active with  things around the house.  Her electrocardiogram today is stable showing  sinus rhythm at 69 beats per minute with low voltage and no significant  changes compared to her prior tracing from May.   ALLERGIES:  ASPARTAME and MYCINS.   PRESENT MEDICATIONS:  1. Omeprazole 20 mg p.o. daily.  2. Hydrochlorothiazide 25 mg p.o. daily.  3. Enalapril 20 mg p.o. daily.  4. Crestor 10 mg p.o. every other day.  5. Vitamin B12.  6. Vitamin C.  7. Iron supplements.  8. Aspirin 325 p.o. daily.  9. Advair 250/50 b.i.d.  10.Norvasc 10 mg p.o. daily.  11.Calcium supplement.  12.Metoprolol 50 mg p.o. b.i.d.   REVIEW OF SYSTEMS:  As in history of present illness.   PHYSICAL EXAMINATION:  VITAL SIGNS:  Blood pressure is 106/62, heart rate  80. Weight is 167 pounds which is stable.  GENERAL: The patient is comfortable and in no acute distress.  NECK:  No elevated jugular venous pressure.  LUNGS:  Clear without labored breathing.  CARDIAC:  Regular rate and rhythm without  S3 gallop or significant murmur.  EXTREMITIES:  No pitting edema.   IMPRESSION AND RECOMMENDATIONS:  1. Coronary artery disease, status post previous angioplasty of first      diagonal branch with ejection fraction of 51% and stable symptomatology      on medical therapy.  Will not make medication adjustments at this time      and plan to see her back for symptom review in the next 6 months.  I      suspect that most of prior fatigue and weakness was due to anemia      rather than a change in her cardiac status.  2. Hyperlipidemia, on statin therapy.  Prior LDL in April, 2007, was at      goal.  She is due to see Dr. Oneta Rack back for routine evaluation.       Jonelle Sidle, MD     SGM/MedQ  DD:  02/09/2006  DT:  02/10/2006  Job #:  [id number]  cc:   Lucky Cowboy, M.D.

## 2010-09-24 NOTE — Discharge Summary (Signed)
[place]. [facility]  Patient:    [name] [name], [name] [name]                       MRN: [id number] Adm. Date:  [id number] Disc. Date: 10/20/00 Attending:  [name]                           Discharge Summary  DISCHARGE DIAGNOSES: 1. Acute gastroenteritis, presumed viral. 2. Rectal hemorrhage. 3. Hypertension. 4. Irritable bowel syndrome history. 5. Gastroesophageal reflux disease. 6. Asthma.  COMPLICATIONS:  None.  PROCEDURES:  None.  HISTORY OF PRESENT ILLNESS:  The patient is a very nice 75 year old, married, white female with hypertension, irritable bowel syndrome, history of asthma who presented to the emergency room with acute onset of retracted nausea, vomiting, diarrhea and rectal bleeding.  Evaluation found elevated WBC with leukocytosis at 26,000 with left shift.  Abdominal CT scan suggested a focal descending colitis.  HOSPITAL COURSE:  The patient failed initial stabilization with IV fluid rehydration and attempted control of emesis with parenteral antiemetics and was felt in need of hospitalization.  For details, please see admission note. Following admission, the patient was continued on IV fluids and given routine Compazine by rectum as intravenous form apparently has been unavailable for approximately one year.  She is also allowed promethazine intravenous for control of symptoms.  Over the next 36 hours, the patients condition markedly improved and she was slowly advanced on a clear liquid diet in which she tolerated.  Vital signs remained stable with slightly elevated blood pressures and occasional slight rise in temperature.  Follow up lab data found WBC had dropped toward near normal and hemoglobin remained stable.  The patient reported no more grossly bloody stools.  As the patient was felt to have achieved maximum benefits of hospitalization, arrangements are made for subsequent discharge and followup as an  outpatient.  LABORATORY DATA AND X-RAY FINDINGS:  Initial hemoglobin 15.7 g percent dropping to 13.1 g percent with rehydration and initial WBC 26,300 with 88% polys, leftward shift dropping to 14,300 and normal platelet counts noted. CMT on admission showed random glucose 146 and later fasting at 136 mg percent.  CMT and BMT were otherwise unremarkable with normal renal functions and hepatic enzymes.  Urinalysis on admission showed specific gravity of 1.019 and trace ketones, otherwise negative.  EKG showed sinus tachycardia with a rate 104 and was otherwise unremarkable with no acute changes.  CT scan report is unavailable at the time of dictation, but reportedly showed focal descending colitis with no signs of free air or other intra-abdominal abnormalities.  CONDITION ON DISCHARGE:  Stable and improved.  Prognosis good.  SPECIAL INSTRUCTIONS:  The patient is advised to continue all prehospital medications and to contact the office on the morning of discharge for location of pharmacy to call in prescription for Compazine suppositories and Phenergan tablets to have available if needed.  She was further advised not to resume the Zithromax and prednisone which she had been prescribed several days prior to admission as her tracheobronchial symptoms had apparently resolved.  DIET:  Avoid milk products with the exception of taking yogurt. DD:  10/19/00 TD:  10/20/00 Job: [id number]

## 2010-09-24 NOTE — Op Note (Signed)
NAME:  [name] [name], [name] [name]                ACCOUNT NO.:  [id number]   MEDICAL RECORD NO.:  [id number]          PATIENT TYPE:  INP   LOCATION:  2899                         FACILITY:  [facility]   PHYSICIAN:  Hewitt Shorts, M.D.DATE OF BIRTH:  Sep 16, 1935   DATE OF PROCEDURE:  10/13/2004  DATE OF DISCHARGE:                                 OPERATIVE REPORT   PREOPERATIVE DIAGNOSIS:  Lumbar stenosis.   POSTOPERATIVE DIAGNOSIS:  Lumbar stenosis.   PROCEDURE:  L4 to S1 decompressive lumbar laminectomy with microdissection.   SURGEON:  Shirlean Kelly, M.D.   ASSISTANT:  Hilda Lias, M.D.   ANESTHESIA:  General endotracheal.   INDICATION:  The patient is a 75 year old woman who presented with bilateral  lumbar radicular pain.  She was found to have multifactorial lumbar  stenosis.  An Decision was made to proceed with decompressive lumbar  laminectomy.   PROCEDURE:  The patient brought to the operating room and placed under  general endotracheal anesthesia.  The patient was turned to a prone  position.  The lumbar region was prepped with Betadine soap and solution and  draped in a sterile fashion.  The midline was infiltrated with local  anesthetic with epinephrine.  A midline incision was made and carried down  through the subcutaneous tissue with bipolar cautery.  Electrocautery was  used to maintain hemostasis.  Dissection was carried down to the lumbar  fascia which was incised bilaterally and the paraspinal muscles were  dissected over the spinous processes of lamina in a subperiosteal fashion.  A self-retaining retractor was placed, an x-ray was taken and we localized  the L4, L5 and S1 lamina and spinous process.  The patient had a previous  right L5 S1 laminotomy and microdiskectomy 17 years ago.   Laminectomy was performed using double action rongeurs, the XMax drill and  Kerrison punches.  The microscope was draped and brought in the field to  provide additional magnification  illumination and visualization.  The  decompression was performed using microdissection and microsurgical  technique.  Care was taken around the previous right L5 S1 laminotomy.  The  ligamentum flavum was thickened, L4-5 worse than L5 S1.  We were able to  perform the laminectomy and then carefully remove the thickened ligamentum  flavum.  The decompression was extended laterally with removal of the  lateral portion of the ligamentum flavum, decompressing the nerve roots as  they exited.  Good decompression of the stenosis at both L4-5 and L5 S1 was  achieved, good hemostasis was established.  Gelfoam soaked in thrombin was  placed in the laminectomy defect and then we proceeded with closure of the  paraspinal muscles proximally with interrupted undyed 1 Vicryl sutures, the  deep fascia closed with interrupted undyed 1 Vicryl sutures, the  subcutaneous and subcuticular closed with interrupted undyed 2-0 Vicryl  sutures, the skin edges were approximated with Dermabond.  The wound was  dressed with Adaptic and sterile  gauze.  The procedure was tolerated well.  The estimated blood loss was 50  mL.  Sponge and needle count were correct.  Following surgery,  the patient  was turned back to the supine position to be reversed from anesthetic,  extubated and transferred to the recovery room for further care.       RWN/MEDQ  D:  10/13/2004  T:  10/13/2004  Job:  [id number]

## 2010-09-24 NOTE — H&P (Signed)
[place]. [facility]  Patient:    [name] [name], [name] [name]                       MRN: [id number] Adm. Date:  [id number] Attending:  Cathren Laine                         History and Physical  PATIENT PROFILE:  The patient is a delightful 75 year old married white female with hypertension being admitted with an acute gastroenteritis and rectal bleeding.  CHIEF COMPLAINT:  Vomiting, rectal bleeding.  HISTORY OF PRESENT ILLNESS:  The patient was in her usual state of health until the evening of admission.  While eating supper, she developed the sensation of nausea and suddenly developed protracted vomiting to the point of dry heaving without hematemesis noted.  The patient relates that her abdomen became distended.  She subsequently developed some diarrhea of a water nature and, after several evacuations, she noted some blood in the toilet bowl.  She thereafter presented to the emergency room for evaluation.  Workup included CBC with the finding of a normal hemoglobin with elevated wbcs of 26,300. CMET found no significant abnormalities.  Abdominal CT scan suggested focal descending colitis.  The patient has, at this point, been given approximately 1500 cc of IV fluids for rehydration as well as two intravenous boluses of Phenergan without control of her nausea and vomiting.  Also, she was reported in the emergency room to have had a bloody diarrheal stool.  The patient has had episodes in the past of episodic abdominal pain, nausea, vomiting and diarrhea, which has been evaluated to rule out carcinoid, and ultimately has been suspected that she may have had an intolerance to aspartame.  Since avoiding aspartame in her diet, she has had no spells over the last two years until this current episode.  She is uncertain of any particular food trigger which may have precipitated this event.  The patient also has a history of gastroesophageal reflux disease, for which she is  has taken Tagamet in the past and used Tums on occasion for rescue of dyspepsia. She has had no recent problems with reflux, water brash or other abdominal pain, cramping, nausea, vomiting, diarrhea or constipation other than this current episode.  Of note, the patient was seen in the office yesterday for acute tracheobronchial syndrome and prescribed Zithromax, steroid taper and Histussin.  Other problems include long-standing hypertension.  History of asthma.  In 1994, the patient had a TIA.  MEDICATIONS:  Premarin 900 mcg.  Zyrtec 10 mg.  Singulair 10 mg.  Accuretic 20/25.  Verapamil SR 240 mg.  Advair 100/50 b.i.d.  Combivent MDI, two inhalations q.4h. p.r.n.  Baby aspirin 81 mg.  Vitamin E 400 iu.  Vitamin C 500 mg.  Prescription of Zithromax, Histussin and prednisone.  ALLERGIES:  Erythromycin and Biaxin with angioedema, although the patient has taken Zithromax without intolerance.  PAST MEDICAL HISTORY:  Usual childhood illnesses without history of rheumatic fever, scarlet fever, murmurs, known heart disease, diabetes, kidney or thyroid disease.  PROBLEMS:  Include the above-noted hypertension, GERD, IBS, hyperlipidemia, history of asthma, fibrocystic breast disease, remote duodenal ulcer in 1980. TIA in 1994.  DT booster in December 1995.  Pneumovax in December 1995.  Flu vaccine in January 2002.  PAST SURGICAL HISTORY:  In 1972, hemorrhoidectomy by Dr. Terri Piedra.  In 1975, appendectomy by Dr. Orpah Greek.  In 1978, vaginal hysterectomy by Dr. Arletha Grippe. In 1989,  lumbar laminectomy and HNP by Dr. Newell Coral.  In 1990, colonoscopy by Dr. ______ .  In 1992, EGD with the finding of herpes simplex esophagitis by Dr. Madilyn Fireman.  In 1995, EGD with the finding of GERD and reflux esophagitis.  In 1998, laparoscopic cholecystectomy by Dr. Orpah Greek.  In 2000, pubovaginal sling procedure and repair of rectal prolapse by Dr. Arletha Grippe and Dr. Annabell Howells.  FAMILY HISTORY:  Father deceased at 61 of ASHD MI.   Mother deceased at 73 with ______ CVA, cancer of the colon and fungal meningitis.  Three brothers, aged 65, 41 and 45 in good health.  A son 23 and a daughter 62 and three grandchildren in good health.  Family history positive for heart disease in the father, stroke in the mother, cancer of the colon in the mother, cancer of the lung in grandfather. Negative hypertension, diabetes, thyroid disease, tuberculosis, or seizure disorder.  SOCIAL HISTORY:  She has been married for 48 years.  Her husband is 3 with hypertension and insulin-dependent diabetes.  The patient retired 27 years from [employer] in July 2001.  No history of tobacco or alcohol, substance use or abuse.  Right handed.  REVIEW OF SYSTEMS:  Vision corrected with bifocals.  Denying diplopia, visual blurring, spots, or flashes.  Hearing difficulty tinnitus.  History of seasonal nasal stuffiness and postnasal drainage.  No dysarthria or dysphasia.  GI as above.  Recent cough productive of yellowish sputum, apparently cleared over the last 12-24 hours since treated about two days previously.  No history of hemoptysis or dyspnea.  She does have a history of some episodic wheezing, although improved over the last day.  Cardiovascular and GI as above.  No exertional chest pain, palpitations, orthopnea, PND, claudication or dependent edema.  No nocturia, dysuria or incontinence, significant musculoskeletal or neurologic symptoms.  PHYSICAL EXAMINATION:  VITAL SIGNS:  Blood pressure recorded 118/79, pulse 98, respirations 30, temperature recorded 98.0 orally.  SKIN:  Clear with good color without rash, lesion, cyanosis, or icterus noted.  HEENT:  EAC patent.  TMs normal.  VF full to gross confrontation.  EOM normal and full.  Mild right-sided cataract.  The left lens is clear.  PERRLA. Funduscopic normal and unremarkable with normal AV ratio and no H or E.  Nasal and oropharynx clear with dentition absent.  Tongue  normal texture, slightly coated and dry.  NECK:  Supple.  Carotids 2+.  Normal upstrokes.  No bruits, JVD, thyromegaly or adenopathy.   CHEST:  Mild kyphosis and increased AP diameter with breath sounds transmitted apically and clear without rales, rhonchi or wheezes.  BREASTS:  Cystic changes without masses noted.  COR:  Heart sounds normal.  No lifts or thrills.  Regular rate and rhythm without appreciable murmurs, rubs, gallops or clicks.  EXTREMITIES:  Pedal pulses normal with no edema.  Skin turgor is normal.  ABDOMEN:  Distended and diffusely tender without definite guarding or rebound. Bowel sounds are hypoactive.  RECTAL:  Moderate hemorrhoidal tags evident without evidence of fissure.  Anal sphincter tone is normal.  Digitorectal exam finds the rectal vault devoid of stool or specimen for Hemoccult.  MUSCULOSKELETAL:  General range of motion full throughout.  Muscle power, tone and bulk normal and symmetric.  Gait and station not tested.  NEUROLOGIC:  Sensory, motor, cerebellar and coordination function grossly normal and symmetric.  Deep tendon reflexes normal and symmetric.  IMPRESSION: 1. Acute gastroenteritis, suspected viral etiology. 2. Rectal hemorrhage. 3. History of hypertension. 4. History of asthma.  5. History of gastroesophageal reflux disease and irritable bowel syndrome.  PLAN:  Admit for IV fluids, "bowel rest," PPI and antiemetics as ordered. DD:  10/18/00 TD:  10/18/00 Job: [id number]

## 2010-09-24 NOTE — Cardiovascular Report (Signed)
[place]. [facility]  Patient:    [name] [name], [name] [name] Visit Number: [id number] MRN: [id number]          Service Type: CAT Location: [id number] Attending Physician:  Daisey Must Dictated by:   Daisey Must, M.D. [facility] Proc. Date: 11/16/01 Admit Date:  11/16/2001 Discharge Date: 11/17/2001   CC:         Marinus Maw, M.D.  Nathen May, M.D. [facility]  Cardiac Catheterization Laboratory   Cardiac Catheterization  PROCEDURES PERFORMED: 1. Left heart catheterization with coronary angiography and left    ventriculography. 2. Percutaneous transluminal coronary angioplasty of the first diagonal    branch.  INDICATIONS: The patient is a 75 year old woman with multiple cardiac risk factors. She has a 2 to 52-month history of progressive exertional dyspnea, fatigue and chest tightness. She recently underwent a stress Cardiolite scan in the office, which was notable for an anteroapical defect on stress images with an ejection fraction of 61%. She was thus referred for cardiac catheterization.  CATHETERIZATION PROCEDURE: A 6 French sheath was placed in the right femoral artery.  Standard Judkins 6 French catheters were utilized.  Contrast was Omnipaque.  There were no complications.  RESULTS:  HEMODYNAMICS: Left ventricular pressure 150/12, aortic pressure 160/76.  There was no aortic valve gradient.  LEFT VENTRICULOGRAM: There is moderate akinesis of the anterolateral wall. Otherwise wall motion is normal. Ejection fraction is estimated at 60%. There is no mitral regurgitation.  CORONARY ARTERIOGRAPHY: (Right dominant).  Left main is normal.  Left anterior descending has a diffuse 40% stenosis extending from the proximal to mid vessel. In the mid to distal vessel, there is a diffuse 30% stenosis. The LAD gives rise to a first diagonal branch which was fairly long but slender. This and diagonal branch is 100% occluded proximally with  TIMI-2 flow into the distal vessel. This supplies the majority of the anterolateral wall. There is a smaller second diagonal branch which is also 100% occluded and fills by grade I to II left to left collaterals.  Left circumflex has diffuse minor luminal irregularities in the distal vessel extending into a large third obtuse marginal branch. The circumflex gives rise to a small ramus intermediate, normal sized OM-1, small branching OM-2 and a large OM-3. OM-2 has a 95% stenosis in the mid vessel.  The right coronary artery is a dominant vessel. The right coronary artery gives rise to a normal sized posterior descending artery and a normal sized posterolateral branch. The right coronary artery is normal.  IMPRESSIONS: 1. Left ventricular systolic function characterized by moderate akinesis of    the anterolateral wall but overall preserved ejection fraction. 2. Small vessel coronary artery disease involving diagonal branches and a    small obtuse marginal branch. In review of these findings, the most    significant of the diseased small branch, it appears to be the first    diagonal which does supply a large portion of the anterolateral wall. This    is where the perfusion defect was demonstrated on Cardiolite imaging. After    reviewing discussion with the patient, we opted to proceed with    percutaneous intervention of the first diagonal branch. See below.  PERCUTANEOUS TRANSLUMINAL CORONARY ANGIOPLASTY PROCEDURE: Following completion of the diagnostic catheterization, we proceeded with percutaneous coronary intervention. We utilized the preexisting 6 French sheath in the right femoral artery. Heparin and Integrilin were administered per protocol. We used a 6 [place] guiding catheter. We initially attempted  to cross the lesion with a Hi-Torque Floppy wire, however, this would not cross. We then utilized a Choice PT wire, which would not cross. We again attempted to cross with  the Choice PT wire utilizing backup with an angioplasty balloon. However, this again would not cross the lesion. We then changed out for a PT Graphix wire, again utilizing balloon backup. With this, we were able to successfully cross the occlusion of the first diagonal branch and advance the wire into the distal portion of the diagonal. We then advanced the balloon over the wire and carefully positioned it across the lesion. We performed one inflation to 8 atmospheres for 60 seconds. We then did a second inflation to 14 atmospheres for 3 minutes. Final angiographic images were obtained revealing patency of the first diagonal branch with 25% residual stenosis and TIMI-3 flow.  COMPLICATIONS: None.  RESULTS: Successful percutaneous transluminal coronary angioplasty of the first diagonal branch reducing a 100% chronic total occlusion with TIMI-2 flow to 25% residual with TIMI-3 flow.  PLAN: Integrilin will be continued for 18 hours.  The patient should remain on aspirin indefinitely. Would recommend medical therapy for her residual small vessel disease. Of note, the obtuse marginal branch could be treated percutaneously if felt to be indicated. However, given the small size of this vessel and the small length of the vessel beyond the occlusion, the recurrence rate would be relatively high. Thus, would attempt medical therapy and reserve angioplasty for symptoms which were refractory to medical therapy. Dictated by:   Daisey Must, M.D. [facility] Attending Physician:  Daisey Must DD:  11/16/01 TD:  11/20/01 Job: [id number]

## 2010-09-24 NOTE — H&P (Signed)
NAME:  [name] [name], [name] [name]                          ACCOUNT NO.:  [id number]   MEDICAL RECORD NO.:  [id number]                   PATIENT TYPE:  INP   LOCATION:  4728                                 FACILITY:  [facility]   PHYSICIAN:  Elliot Cousin, M.D.                 DATE OF BIRTH:  02-13-1936   DATE OF ADMISSION:  12/01/2002  DATE OF DISCHARGE:                                HISTORY & PHYSICAL   CHIEF COMPLAINT:  1. Rectal bleeding.  2. Bloody diarrhea.   HISTORY OF PRESENT ILLNESS:  [name] [name] is a 75 year old woman who is a  patient of Dr. Oneta Rack with a past medical history significant for bloody  diarrhea in 2002, gastroesophageal reflux disease, asthma and hypertension,  who presented to the emergency room today  with several episodes of diarrhea  and rectal bleeding. The patient also has had multiple episodes of nausea  and vomiting.   The symptoms started yesterday approximately  12:30 p.m. after eating lunch  at the [facility]. The patient had nachos with melted cheese, beans  and ground beef. Approximately 1 hour after eating the meal, the patient  began to have 2 to 3 semisolid bowel movements that eventually changed to  watery diarrhea with the subsequent bowel movements.   With the last bowel movement the patient had red blood intermixed with the  stool. However, she had 2 more episodes of just pure rectal bleeding without  any stool. The patient also had approximately 5 episodes of nausea and  vomiting with the consistency consistent with the food that she had eaten.  She denied any hematemesis. The patient also has associated lower abdominal  cramping, moreso on the left lower quadrant and the right lower quadrant.   The patient denied any fevers or chills or painful urination. She denied any  recent nonsteroidal antiinflammatory drug use. She denies any alcohol use.  She has not been treated with any antibiotics recently. She has not had any  recent  traveling.  In no acute distress her review of systems is negative  for dysphagia, recent heart  burn, weight loss, fevers or chills, chest  pain, shortness of breath, dysuria or swelling in  her legs.   When the patient arrived to the emergency department she was given IV fluid  bolus of normal saline at 300 mL an hour  initially and then at 250 mL an  hour along with Zofran, Phenergan and Dilaudid for nausea and pain. A CT  scan of the abdomen was obtained and revealed diffuse wall thickening and  inflammatory changes in the transverse and distal colon. The patient will  therefore be admitted for rectal bleeding and colitis.   The patient also mentioned that she had an upper endoscopy and a colonoscopy  in 2001 or 2002 by Dr. [name]. The patient says the results were  unremarkable.   PAST MEDICAL HISTORY:  1. Admission  to the hospital in June 2002 with acute gastroenteritis and     rectal bleeding. The CT scan at that time revealed descending and     possible transverse colitis.  2. Esophagogastroduodenoscopy and colonoscopy in 2001 or 2002 per patient     history unremarkable (records not available).  3. Hypertension.  4. Gastroesophageal reflux disease.  5. Hyperlipidemia.  6. Asthma.  7. Fibrocystic breast disease.  8. Remote history of duodenal ulcer in 1960.  9. History of a TIA in 1994.  10.      Status post laparoscopic cholecystectomy in 1998.  11.      Lumbar laminectomy and HNP in 1989 per Dr. Newell Coral.  12.      Status post herpes simplex esophagitis in 1992 per EGD.  13.      Status post  hemorrhoidectomy in 1972 per Dr. Terri Piedra.  14.      Status post  appendectomy in 1975 per Dr. Orpah Greek.  15.      Status post vaginal hysterectomy in 1978 per Dr. Arletha Grippe.  16.      History of gastroesophageal reflux disease and reflux esophagitis     in 1995 per EGD.  17.      Negative workup for pheochromocytoma and carcinoid syndrome in 2001     (the patient's symptoms felt to be secondary  to allergy to aspartame).   CURRENT MEDICATIONS:  1. Accupril 10 mg b.i.d.  2. Norvasc 5 mg every day.  3. Ranitidine 75 mg every day.  4. Zyrtec 10 mg every day.  5. Advair 100/50 1 inhalation b.i.d.  6. Aspirin 325 mg every day.  7. Lipitor 10 mg every  other  day.   ALLERGIES:  1. ERYTHROMYCIN.  2. BIAXIN.  3. ASPARTAME.   FAMILY HISTORY:  Her mother is deceased at 34 years of age secondary to  bacterial meningitis. The patient's mother also had a history of stroke and  colon cancer. The patient's father is deceased secondary to myocardial  infarction at the age of 73 years old. She has a brother who is 73 years of  age and has a history of myocardial infarction status post  coronary artery  bypass grafting. She has another brother who is 2 years old and has a  history of coronary artery disease.   SOCIAL HISTORY:  The patient is married and [place] her [place] She is [place]. She has 2 children, 1 son who is  107 years old and 1 daughter who is 39 years old. She denies any alcohol,  drug or tobacco use. She can read and write.   PHYSICAL EXAMINATION:  VITAL SIGNS:  Current vital signs temperature 99.0,  pulse 102, respiratory rate 16, blood pressure 123/86, oxygen saturation on  2 liters 99%.  GENERAL:  The patient is a pleasant mildly overweight Caucasian woman who is  currently lying in bed in no acute distress.  HEENT:  Normocephalic and atraumatic. Pupils are equal, round and reactive  to light. Extraocular movements intact. Conjunctivae clear. Sclerae are  white. Tympanic membranes are bilaterally clear. Nasal mucosa is moist  without any drainage. Oropharynx reveals mildly dry mucous membranes with no  posterior exudate or erythema. She does have dentures.  NECK:  Supple, mild possible thyromegaly. No JVD or bruit.  LUNGS:  Clear to auscultation bilaterally. HEART:  S1, S2 without murmur, rub or gallops.  ABDOMEN:  Bowel sounds  are present. The abdomen is obese. She has a  well  healed epigastric scar and a well healed right lower quadrant scar. She is  diffusely tender. However, she is more tender in her left lower quadrant and  right lower quadrant.  GU:  Deferred.  RECTAL:  The patient has a large nonbleeding hemorrhoid. She has good rectal  tone. No rectal lesions palpated. Scant stool in the rectal vault which is  maroon in color.  EXTREMITIES:  The patient has no pretibial edema. She has a 2+ pedal pulse  on the left lower extremity and a 1+ pulse on the right lower extremity.  NEUROLOGIC:  The patient is alert and oriented x3. Cranial nerves 2 to 12  grossly intact. The patient's strength  and sensation  are intact  throughout.   LABORATORY DATA:  Admission laboratories, an EKG normal sinus rhythm with Q-  waves in the lateral and septal leads. A CT scan of the abdomen and pelvis  with contrast revealed  wall thickening and inflammatory changes in the  transverse and descending colon.   Sodium 135, potassium  6.2 (hemolyzed), repeated at 3.3, chloride 98, CO2  24, glucose 123, BUN 18, creatinine 1.0. Calcium  9.8, total protein 7.7,  albumin 4.3. AST 57, ALT 15, alkaline phosphatase 74, total bilirubin 1.7.  Amylase 131, lipase 19. PT 12.4, INR 0.9, PTT 26. WBC 16.9, hemoglobin 13.6,  hematocrit 40.2%, platelets 426, MCV 81.8.   ASSESSMENT:  1. Hematochezia with radiographic evidence of inflammatory colitis. Consider     infectious colitis, ischemia and neoplasm. The patient was given IV fluid     boluses in the emergency department as well as antiemetic medication and     pain medication. She is currently stable and in no acute distress and     does not complain of abdominal pain at this time. She has only had 1     scant bowel movement while in the emergency department. This bowel     movement was mildly bloody per her history.  2. Leukocytosis. The leukocytosis is probably secondary to the  inflammatory     colitis. She is afebrile. We will probably hold on antibiotics until     gastroenterology assesses the patient.  3. Mildly elevated bilirubin and AST. This could be secondary to mild volume     depletion.  4. Hypokalemia. This is probably the result of diarrhea, rectal bleeding and     nausea and vomiting.   PLAN:  1. Will admit the patient to a telemetry  bed secondary to the rectal     bleeding.  2. Continue IV fluids with D5 normal saline with 30 mEq of potassium     chloride at 150 mL an hour.  3. Pain medication with morphine IV q.4h. p.r.n. pain.  4. Phenergan 12.5 to 25 mg IV q.4h. p.r.n. nausea and vomiting.  5. Empiric treatment with Protonix  40 mg IV every 24 hours.  6. Obtain stool cultures for routine enteric bacteria, Clostridium     difficile, fecal leukocytes and ova and parasites.  7. Consult gastroenterology, [facility]. 8. Keep the patient mostly n.p.o. with the exception of rare sips and chips.  9. Replete potassium IV as needed and p.o. when she is ready to take oral     medications.  Elliot Cousin, M.D.    DF/MEDQ  D:  12/01/2002  T:  12/01/2002  Job:  [id number]

## 2010-09-24 NOTE — Assessment & Plan Note (Signed)
[facility]                            CARDIOLOGY OFFICE NOTE   [name], [name]                       MRN:          [id number]  DATE:08/28/2006                            DOB:          04/14/1936    PRIMARY CARE PHYSICIAN:  Lucky Cowboy, M.D.   REASON FOR VISIT:  Cardiac followup.   HISTORY OF PRESENT ILLNESS:  I saw [name] [name] back in October.  She  reports no trouble with angina or dyspnea on exertion.  Her medications  are outlined below.  Electrocardiogram today demonstrates normal sinus  rhythm at 62 beats per minute with no significant changes compared to  the previous tracing from October last year.  Last ischemic evaluation  was via a myocardial perfusion study in November 2006 demonstrating scar  with minimal peri-infarct ischemia and ejection fraction of 51%.   ALLERGIES:  ASPARTAME and MYCINS.   MEDICATIONS:  1. Advair 250/50.  2. Norvasc 10 mg p.o. daily.  3. Hydrochlorothiazide 25 mg p.o. daily.  4. Prilosec 20 mg p.o. daily.  5. Metoprolol 50 mg p.o. b.i.d.  6. Enalapril 20 mg p.o. daily.  7. Aspirin 325 mg p.o. daily.  8. Vitamin B12.  9. Calcium supplements.  10.Iron supplements.  11.Vitamin D and C.  12.Crestor 20 mg 1/2 tablet p.o. daily.   REVIEW OF SYSTEMS:  As in History of Present Illness.   PHYSICAL EXAMINATION:  VITAL SIGNS:  Blood pressure 100/58, heart rate  62, weight 166 pounds.  GENERAL:  The patient is comfortable and in no acute distress.  NECK:  No elevation in jugular venous pressure without bruits.  No  thyromegaly or lymphadenopathy.  LUNGS:  Clear.  No labored breathing.  CARDIAC:  Exam reveals a regular rate and rhythm, no S3 gallop.  EXTREMITIES:  No significant pitting edema.   IMPRESSION:  1. Coronary artery disease status post angioplasty of the first      diagonal with an ejection fraction of 51% and no active angina on      medical therapy.  Last ischemia evaluation was within 2 years.   I      will plan to see her back to review symptoms in 6 months.  2. Hyperlipidemia, followed by Dr. Oneta Rack.  Recommend aggressive LDL      control around 70.     Jonelle Sidle, MD  Electronically Signed    SGM/MedQ  DD: 08/28/2006  DT: 08/28/2006  Job #: [id number]   cc:   Lucky Cowboy, M.D.

## 2010-10-11 ENCOUNTER — Encounter: Payer: Self-pay | Admitting: Physician Assistant

## 2010-10-12 ENCOUNTER — Encounter: Payer: Self-pay | Admitting: Physician Assistant

## 2010-10-12 ENCOUNTER — Ambulatory Visit (INDEPENDENT_AMBULATORY_CARE_PROVIDER_SITE_OTHER): Payer: Medicare Other | Admitting: Physician Assistant

## 2010-10-12 ENCOUNTER — Telehealth: Payer: Self-pay | Admitting: *Deleted

## 2010-10-12 VITALS — BP 110/60 | HR 99 | Ht 66.0 in | Wt 169.0 lb

## 2010-10-12 DIAGNOSIS — I2510 Atherosclerotic heart disease of native coronary artery without angina pectoris: Secondary | ICD-10-CM

## 2010-10-12 DIAGNOSIS — R0602 Shortness of breath: Secondary | ICD-10-CM

## 2010-10-12 DIAGNOSIS — Z862 Personal history of diseases of the blood and blood-forming organs and certain disorders involving the immune mechanism: Secondary | ICD-10-CM

## 2010-10-12 DIAGNOSIS — J209 Acute bronchitis, unspecified: Secondary | ICD-10-CM

## 2010-10-12 DIAGNOSIS — R079 Chest pain, unspecified: Secondary | ICD-10-CM

## 2010-10-12 NOTE — Patient Instructions (Signed)
Your physician recommends that you schedule a follow-up appointment in: 10/26/10 @ 9:30 am with [employer], PA-C SAME DAY DR. Shirlee Latch IN THE OFFICE.  Your physician has requested that you have en exercise stress Myoview 786.05, 786.50 THIS NEEDS TO BE DONE BEFORE PT APPT 10/26/10. For further information please visit [email or web address] Please follow instruction sheet, as given.   Your physician has requested that you have an echocardiogram 786.05 THIS NEEDS TO BE DONE BEFORE PT'S APPT 10/26/10. Echocardiography is a painless test that uses sound waves to create images of your heart. It provides your doctor with information about the size and shape of your heart and how well your heart's chambers and valves are working. This procedure takes approximately one hour. There are no restrictions for this procedure.   Your physician has recommended you make the following change in your medication: INCREASE NEBULIZER USE TO FOUR TIMES DAILY; START PREDNISONE THAT HAS BEEN PRESCRIBED.

## 2010-10-12 NOTE — Assessment & Plan Note (Signed)
Get recent labs from PCP.

## 2010-10-12 NOTE — Assessment & Plan Note (Signed)
Chest pain only occurs with cough.  Suspect related to bronchitis.  Check myoview as above.

## 2010-10-12 NOTE — Assessment & Plan Note (Addendum)
She is still having a lot of wheezing.  She was recently given a prescription for prednisone.  I encouraged her to get this and to increase her DuoNebs to QID. She has f/u with Dr. Oneta Rack next week.  She is stopping the Dilaresp.  It has caused significant side effects for her.

## 2010-10-12 NOTE — Telephone Encounter (Signed)
SEE PHONE NOTE

## 2010-10-12 NOTE — Assessment & Plan Note (Signed)
As noted, check myoview.  She is not on ASA.  Get labs from PCP.  If Hgb stable, will likely ask her to restart with close check on H/H.

## 2010-10-12 NOTE — Assessment & Plan Note (Signed)
I suspect this is related to her recent acute bronchitis.  However, she feels weak and more short of breath than normal.  Will set her up for a GXT myoview to rule out ischemia and an Echo to assess LVF and right heart pressures.  She has no sign of CHF on exam.  EKG is unremarkable.  Follow up with Dr. Shirlee Latch or me in 2 weeks.

## 2010-10-12 NOTE — Progress Notes (Signed)
History of Present Illness: Primary Cardiologist:  Dr. Marca Ancona PCP: Dr. Myrene Galas is a 75 y.o. female with a h/o CAD, HTN, HLP.  She is status post balloon angioplasty to the first diagonal and 2003.  She had small vessel disease elsewhere with high-grade lesions in the second diagonal and second obtuse marginal that were treated medically.  EF is normal.  Her last Myoview in 5/09 demonstrated anteroseptal and apical infarct with minimal peri-infarct ischemia.  There was no significant change and medical therapy was continued.  She has a history of iron deficiency anemia thought to be secondary to GI bleeding without obvious source.  It was suspected that she had small bowel AVMs.  She presents today with complaints of weakness, dyspnea with exertion and chest pain over the past 2 weeks.  She reports increased DOE with NYHA class 2-2b symptoms.  She is sleeping on 2 pillows.  No orthopnea.  No PND.  No significant edema or weight gain.  She has a chest pressure when she coughs.  No exertional chest pain or arm or jaw pain.  No syncope.  She saw her PCP and was dx with bronchitis.  She was placed on a zpak and Dilaresp.  She has had multiple side effects to Dilaresp including dizziness and nausea.  She has decided to stop taking it.  She had labs drawn with her PCP and was told she is not anemic.  She is still not taking ASA due to her h/o GI bleeding.  She was also told all of her other labs were ok.  She is still coughing.  She has follow up with her PCP next week.  Of note, she was told to hold her metoprolol for now with increased wheezing.  Past Medical History  Diagnosis Date  . CAD (coronary artery disease)     LHC 2003 with 40% pLAD, 30% mLAD, 100% D1 (moderate vessel), 100%  D2 (small vessel), 95% mid small OM2.  Pt had PTCA to D1;  D2 and OM2 small vessels and tx medically;  Last Myoview (5/09):  EF 66%, anteroseptal/apical infarct with minimal peri-infarct ischemia.  No  significant change from 11/06. Pt managed medically  . Recurrent aspiration bronchitis/pneumonia     PFTs 12/09 FVC 97%, FEV1 101%, ratio 77%, TLC 109%. No significant obstruction or restriction.   . Hypertension   . Hyperlipidemia   . Esophageal stricture   . Diverticulosis of colon   . Gastroesophageal reflux disease with hiatal hernia   . Iron deficiency anemia   . Gastritis   . GERD (gastroesophageal reflux disease)     Current Outpatient Prescriptions  Medication Sig Dispense Refill  . ALPRAZolam (XANAX) 0.5 MG tablet Take 0.5 mg by mouth at bedtime as needed.        [name] amLODipine (NORVASC) 10 MG tablet Take 5 mg by mouth daily.        . Azithromycin (ZITHROMAX PO) Take by mouth as directed.        . calcium carbonate (TUMS - DOSED IN MG ELEMENTAL CALCIUM) 500 MG chewable tablet Chew 1 tablet by mouth daily.        . Cholecalciferol (VITAMIN D) 1000 UNITS capsule Take 1,000 Units by mouth daily.        . Cyanocobalamin (VITAMIN B 12 PO) Take by mouth.        . Fluticasone-Salmeterol (ADVAIR DISKUS) 250-50 MCG/DOSE AEPB Inhale 1 puff into the lungs every 12 (twelve) hours.        [name]  HYDROcodone-homatropine (HYCODAN) 5-1.5 MG/5ML syrup Take by mouth every 6 (six) hours as needed.        [name] ipratropium-albuterol (DUONEB) 0.5-2.5 (3) MG/3ML SOLN Take 3 mLs by nebulization.        . metoprolol (LOPRESSOR) 50 MG tablet Take 25 mg by mouth 2 (two) times daily.        . NON FORMULARY dalirest  UAD       . pravastatin (PRAVACHOL) 40 MG tablet Take 40 mg by mouth daily.        . Ferrous Sulfate Dried (FEOSOL) 200 (65 FE) MG TABS Take by mouth.        . lansoprazole (PREVACID) 30 MG capsule Take 30 mg by mouth daily.        [name] loratadine (CLARITIN) 10 MG tablet Take 10 mg by mouth daily.        . valsartan-hydrochlorothiazide (DIOVAN-HCT) 160-25 MG per tablet Take 1 tablet by mouth daily.        [name] DISCONTD: OLIVE LEAF PO Take by mouth.          Allergies: Allergies  Allergen  Reactions  . Aspartame     REACTION: N+V Severe diarrhea numbness around mouth   History  Substance Use Topics  . Smoking status: Never Smoker   . Smokeless tobacco: Not on file  . Alcohol Use: No    ROS:   See HPI.  No fevers, melena, hematochezia, dysuria, hematuria, weight gain.  All other systems reviewed and negative.  Vital Signs: BP 110/60  Pulse 99  Ht 5\' 6"  (1.676 m)  Wt 169 lb (76.658 kg)  BMI 27.28 kg/m2  PHYSICAL EXAM: Well nourished, well developed, in no acute distress HEENT: normal Neck: no JVD Vascular: no carotid bruits Cardiac:  normal S1, S2; RRR; no murmur; no gallops Lungs:  Decreased breath sounds bilaterally, expiratory rhonchi and wheezing bilaterally throughout, no rales. Abd: soft, nontender, no hepatomegaly Ext: no edema Skin: warm and dry Neuro:  CNs 2-12 intact, no focal abnormalities noted Psych: normal affect  EKG:  Normal sinus rhythm, heart rate 99, left axis deviation, poor R-wave progression, low voltage, No significant change since previous tracing  ASSESSMENT AND PLAN:

## 2010-10-13 ENCOUNTER — Encounter: Payer: Self-pay | Admitting: *Deleted

## 2010-10-14 ENCOUNTER — Other Ambulatory Visit (HOSPITAL_COMMUNITY): Payer: Medicare Other | Admitting: Radiology

## 2010-10-14 ENCOUNTER — Telehealth: Payer: Self-pay | Admitting: *Deleted

## 2010-10-14 ENCOUNTER — Encounter (HOSPITAL_COMMUNITY): Payer: Medicare Other | Admitting: Radiology

## 2010-10-14 NOTE — Telephone Encounter (Signed)
See phone note

## 2010-10-18 ENCOUNTER — Telehealth: Payer: Self-pay | Admitting: *Deleted

## 2010-10-18 NOTE — Telephone Encounter (Signed)
See phone note

## 2010-10-19 ENCOUNTER — Telehealth: Payer: Self-pay | Admitting: *Deleted

## 2010-10-19 DIAGNOSIS — I2510 Atherosclerotic heart disease of native coronary artery without angina pectoris: Secondary | ICD-10-CM

## 2010-10-19 NOTE — Telephone Encounter (Signed)
See phone note

## 2010-10-20 ENCOUNTER — Other Ambulatory Visit: Payer: Self-pay | Admitting: Physician Assistant

## 2010-10-22 ENCOUNTER — Encounter: Payer: Self-pay | Admitting: Physician Assistant

## 2010-10-26 ENCOUNTER — Ambulatory Visit: Payer: Medicare Other | Admitting: Physician Assistant

## 2010-11-18 ENCOUNTER — Ambulatory Visit (INDEPENDENT_AMBULATORY_CARE_PROVIDER_SITE_OTHER): Payer: Medicare Other | Admitting: Cardiology

## 2010-11-18 ENCOUNTER — Encounter: Payer: Self-pay | Admitting: Cardiology

## 2010-11-18 DIAGNOSIS — R0602 Shortness of breath: Secondary | ICD-10-CM

## 2010-11-18 DIAGNOSIS — E785 Hyperlipidemia, unspecified: Secondary | ICD-10-CM

## 2010-11-18 DIAGNOSIS — I2510 Atherosclerotic heart disease of native coronary artery without angina pectoris: Secondary | ICD-10-CM

## 2010-11-18 NOTE — Patient Instructions (Addendum)
Start Aspirin 81mg  daily--this should be enteric coated.  Schedule an appointment for an echocardiogram.  786.05  Schedule an appointment for lab in 1 month--CBC/BMP  786.05  Your physician wants you to follow-up in: 6 months with Dr Shirlee Latch. (January 2013).You will receive a reminder letter in the mail two months in advance. If you don't receive a letter, please call our office to schedule the follow-up appointment.

## 2010-11-19 NOTE — Progress Notes (Signed)
PCP: Dr. Oneta Rack  75 yo with h/o CAD s/p PTCA to D1 in 2003 presents for cardiology followup. She continues to have problems with recurrent asthma/bronchitis flares.  She was seen in this office on 6/5 by the PA with complaint of shortness of breath and pleuritic chest pain.  She was wheezing on exam.  She was set up for an echo and a myoview but has not had these done.  Since then, she was treated for asthma/bronchitis with nebulized albuterol and atrovent and has been feeling much better.  Metoprolol was stopped due to concern that it could be perpetuating bronchospasm.  She no longer has exertional dyspnea and the wheezing has resolved.  With treatment of asthma, she also no longer is having pleuritic chest pain. She gets mildly fatigued walking around in [place], but in general is doing well.    Since I last saw her a little less than 2 years ago, investigations into the source of her Fe-deficiency anemia have continued.  She had a mesenteric angiogram in 1/11 that was unrevealing. Last month, hematocrit was actually quite good at 42.4%.    Labs (5/10): LDL 69, HDL 75  Labs (6/12): LDL 56, HDL 87, BNP 30, HCT 42.4  Allergies (verified):  1) ! Aspartame (Aspartame)  2) ! * Mycins   Past Medical History:  1. CAD: LHC 2003 with 40% pLAD, 30% mLAD, 100% D1 (moderate vessel), 100% D2 (small vessel), 95% mid small OM2. Pt had PTCA to D1. Last myoview (5/09): EF 66%, anteroseptal/apical infarct with minimal peri-infarct ischemia. No significant change from 11/06. Pt managed medically.  2. Recurrent bronchitis, suspect asthma: PFTs 12/09 FVC 97%, FEV1 101%, ratio 77%, TLC 109%. No significant obstruction or restriction.  Never smoked.  3. HYPERTENSION (ICD-401.9)  4. HYPERLIPIDEMIA (ICD-272.4)  5. ESOPHAGEAL STRICTURE (ICD-530.3)  6. DIVERTICULOSIS, COLON (ICD-562.10)  7. HIATAL HERNIA WITH REFLUX (ICD-553.3)  8. IRON DEFICIENCY ANEMIA, HX OF (ICD-V12.3): EGD, colonoscopy, and capsule endoscopy in  fall 2010 unrevealing for source. She then had mesenteric angiogram in 1/11 with no bleeding source found.  ? small bowel AVMs.   Family History:  Colon cancer in her mother  Heart disease in her father.   Social History:  Patient never smoked.  She is retired since 2002, proior to that she worked at [employer]. She was exposed to acetone and other chemicals at her work.  Married  Alcohol Use - no  Drug Use - no   Review of Systems:  All systems reviewed and negative except as per HPI.   Current Outpatient Prescriptions  Medication Sig Dispense Refill  . ALPRAZolam (XANAX) 0.5 MG tablet Take 0.5 mg by mouth at bedtime as needed.        [name] amLODipine (NORVASC) 10 MG tablet Take 5 mg by mouth daily.        . calcium carbonate (TUMS - DOSED IN MG ELEMENTAL CALCIUM) 500 MG chewable tablet Chew 1 tablet by mouth daily.        . Cholecalciferol (VITAMIN D) 1000 UNITS capsule Take 1,000 Units by mouth daily.        . Cyanocobalamin (VITAMIN B 12 PO) Take by mouth.        . Dextromethorphan-Guaifenesin (MUCINEX DM PO) Take by mouth 2 (two) times daily.        [name] doxycycline (VIBRAMYCIN) 100 MG capsule 3-4 Days left       . enalapril (VASOTEC) 20 MG tablet Take 1 tablet by mouth Daily.        [name]  GNP GARLIC EXTRACT PO Take 600 mg by mouth daily.        . hydrochlorothiazide 25 MG tablet Take 1 tablet by mouth Daily.      [name] HYDROcodone-acetaminophen (VICODIN) 5-500 MG per tablet As needed      . ipratropium-albuterol (DUONEB) 0.5-2.5 (3) MG/3ML SOLN Take 3 mLs by nebulization 4 (four) times daily.  360 mL    . loratadine (CLARITIN) 10 MG tablet Take 10 mg by mouth daily.        . NON FORMULARY dalirest  UAD       . omeprazole (PRILOSEC) 20 MG capsule Take 1 tablet by mouth Daily.        . pravastatin (PRAVACHOL) 40 MG tablet Take 40 mg by mouth daily.        [name] aspirin EC 81 MG tablet Take 1 tablet (81 mg total) by mouth daily.      . Fluticasone-Salmeterol (ADVAIR DISKUS) 250-50  MCG/DOSE AEPB Inhale 1 puff into the lungs every 12 (twelve) hours.        . metoprolol (LOPRESSOR) 50 MG tablet Take 25 mg by mouth 2 (two) times daily.          BP 100/62  Pulse 72  Ht 5\' 5"  (1.651 m)  Wt 168 lb (76.204 kg)  BMI 27.96 kg/m2 General: NAD Neck: No JVD, no thyromegaly or thyroid nodule.  Lungs: Clear to auscultation bilaterally with normal respiratory effort.  No wheezing.  CV: Nondisplaced PMI.  Heart regular S1/S2, no S3/S4, no murmur.  No peripheral edema.  No carotid bruit.  Normal pedal pulses.  Abdomen: Soft, nontender, no hepatosplenomegaly, no distention.  Neurologic: Alert and oriented x 3.  Psych: Normal affect. Extremities: No clubbing or cyanosis.

## 2010-11-19 NOTE — Assessment & Plan Note (Signed)
Lipids at goal (LDL < 70).

## 2010-11-19 NOTE — Assessment & Plan Note (Signed)
Stable with no exertional chest pain.  She had pleuritic chest pain with an asthma/bronchitis flare in 6/12.  This resolved with treatment of asthma.  I do not think that she needs a stress test.  She will restart ASA 81 mg daily (hemoglobin looked good last month), and I will check a CBC in 1 month to make sure there is no evidence for bleeding triggered by the ASA. Continue pravastatin and ACEI.  I think it is reasonable to keep her off metoprolol as this could certainly worsen bronchospasm, which seems to be a significant issue for her.

## 2010-11-19 NOTE — Assessment & Plan Note (Signed)
I suspect that this is primarily related to asthma/bronchitis.  She has improved considerably after an exacerbation last month.  BNP was not elevated.  I will get an echo to make sure that LV systolic function remains normal.

## 2010-12-13 ENCOUNTER — Telehealth: Payer: Self-pay | Admitting: *Deleted

## 2010-12-13 NOTE — Telephone Encounter (Signed)
Left Message for pt that colon is due

## 2010-12-20 ENCOUNTER — Ambulatory Visit (HOSPITAL_COMMUNITY): Payer: Medicare Other | Attending: Cardiology | Admitting: Radiology

## 2010-12-20 ENCOUNTER — Other Ambulatory Visit (INDEPENDENT_AMBULATORY_CARE_PROVIDER_SITE_OTHER): Payer: Medicare Other | Admitting: *Deleted

## 2010-12-20 DIAGNOSIS — R0609 Other forms of dyspnea: Secondary | ICD-10-CM | POA: Insufficient documentation

## 2010-12-20 DIAGNOSIS — R0602 Shortness of breath: Secondary | ICD-10-CM

## 2010-12-20 DIAGNOSIS — R0989 Other specified symptoms and signs involving the circulatory and respiratory systems: Secondary | ICD-10-CM | POA: Insufficient documentation

## 2010-12-20 DIAGNOSIS — E785 Hyperlipidemia, unspecified: Secondary | ICD-10-CM | POA: Insufficient documentation

## 2010-12-20 DIAGNOSIS — I2510 Atherosclerotic heart disease of native coronary artery without angina pectoris: Secondary | ICD-10-CM | POA: Insufficient documentation

## 2010-12-20 DIAGNOSIS — I517 Cardiomegaly: Secondary | ICD-10-CM

## 2010-12-20 DIAGNOSIS — I369 Nonrheumatic tricuspid valve disorder, unspecified: Secondary | ICD-10-CM

## 2010-12-20 DIAGNOSIS — Z862 Personal history of diseases of the blood and blood-forming organs and certain disorders involving the immune mechanism: Secondary | ICD-10-CM

## 2010-12-20 DIAGNOSIS — D649 Anemia, unspecified: Secondary | ICD-10-CM

## 2010-12-20 LAB — CBC WITH DIFFERENTIAL/PLATELET
Eosinophils Relative: 2.4 % (ref 0.0–5.0)
HCT: 43 % (ref 36.0–46.0)
Lymphs Abs: 1.7 10*3/uL (ref 0.7–4.0)
MCV: 92.7 fl (ref 78.0–100.0)
Monocytes Absolute: 1.2 10*3/uL — ABNORMAL HIGH (ref 0.1–1.0)
Platelets: 356 10*3/uL (ref 150.0–400.0)
WBC: 12.6 10*3/uL — ABNORMAL HIGH (ref 4.5–10.5)

## 2010-12-20 LAB — BASIC METABOLIC PANEL
BUN: 13 mg/dL (ref 6–23)
Calcium: 9.4 mg/dL (ref 8.4–10.5)
GFR: 66.92 mL/min (ref >60.00)
Glucose, Bld: 84 mg/dL (ref 70–99)
Sodium: 137 mEq/L (ref 135–145)

## 2010-12-20 NOTE — Telephone Encounter (Signed)
2ND ATTEMPT TO CONTACT PT. L/M

## 2010-12-21 ENCOUNTER — Telehealth: Payer: Self-pay | Admitting: *Deleted

## 2010-12-21 NOTE — Telephone Encounter (Signed)
Called and spoke with pt's husband and notified him that echocardiogram was normal--spouse stated he would inform wife--nt

## 2011-03-01 ENCOUNTER — Emergency Department (HOSPITAL_COMMUNITY): Payer: Medicare Other

## 2011-03-01 ENCOUNTER — Emergency Department (HOSPITAL_COMMUNITY)
Admission: EM | Admit: 2011-03-01 | Discharge: 2011-03-01 | Disposition: A | Discharge status: Home / Self Care | Payer: Medicare Other | Attending: Emergency Medicine | Admitting: Emergency Medicine

## 2011-03-01 DIAGNOSIS — R1013 Epigastric pain: Secondary | ICD-10-CM | POA: Insufficient documentation

## 2011-03-01 DIAGNOSIS — Z9889 Other specified postprocedural states: Secondary | ICD-10-CM | POA: Insufficient documentation

## 2011-03-01 DIAGNOSIS — I10 Essential (primary) hypertension: Secondary | ICD-10-CM | POA: Insufficient documentation

## 2011-03-01 DIAGNOSIS — R079 Chest pain, unspecified: Secondary | ICD-10-CM | POA: Insufficient documentation

## 2011-03-01 DIAGNOSIS — I2510 Atherosclerotic heart disease of native coronary artery without angina pectoris: Secondary | ICD-10-CM | POA: Insufficient documentation

## 2011-03-01 DIAGNOSIS — K449 Diaphragmatic hernia without obstruction or gangrene: Secondary | ICD-10-CM | POA: Insufficient documentation

## 2011-03-01 DIAGNOSIS — K219 Gastro-esophageal reflux disease without esophagitis: Secondary | ICD-10-CM | POA: Insufficient documentation

## 2011-03-01 DIAGNOSIS — J45909 Unspecified asthma, uncomplicated: Secondary | ICD-10-CM | POA: Insufficient documentation

## 2011-03-01 DIAGNOSIS — R10816 Epigastric abdominal tenderness: Secondary | ICD-10-CM | POA: Insufficient documentation

## 2011-03-01 LAB — COMPREHENSIVE METABOLIC PANEL
ALT: 13 U/L (ref 0–35)
AST: 14 U/L (ref 0–37)
Albumin: 4.3 g/dL (ref 3.5–5.2)
CO2: 25 mEq/L (ref 19–32)
Calcium: 10.5 mg/dL (ref 8.4–10.5)
Creatinine, Ser: 0.88 mg/dL (ref 0.50–1.10)
GFR calc non Af Amer: 64 mL/min — ABNORMAL LOW (ref >90)
Sodium: 139 mEq/L (ref 135–145)
Total Protein: 7.7 g/dL (ref 6.0–8.3)

## 2011-03-01 LAB — URINALYSIS, ROUTINE W REFLEX MICROSCOPIC
Hgb urine dipstick: NEGATIVE
Leukocytes, UA: NEGATIVE
Nitrite: NEGATIVE
Protein, ur: NEGATIVE mg/dL
Specific Gravity, Urine: 1.008 (ref 1.005–1.030)
Urobilinogen, UA: 0.2 mg/dL (ref 0.0–1.0)

## 2011-03-01 LAB — CBC
HCT: 44.6 % (ref 36.0–46.0)
MCHC: 34.3 g/dL (ref 30.0–36.0)
RDW: 13.7 % (ref 11.5–15.5)
WBC: 9.5 10*3/uL (ref 4.0–10.5)

## 2011-03-01 LAB — LACTIC ACID, PLASMA: Lactic Acid, Venous: 2.2 mmol/L (ref 0.5–2.2)

## 2011-03-01 LAB — DIFFERENTIAL
Basophils Absolute: 0 10*3/uL (ref 0.0–0.1)
Basophils Relative: 0 % (ref 0–1)
Eosinophils Absolute: 0 10*3/uL (ref 0.0–0.7)
Eosinophils Relative: 0 % (ref 0–5)
Lymphocytes Relative: 33 % (ref 12–46)

## 2011-03-01 LAB — TROPONIN I: Troponin I: <0.3 ng/mL (ref <0.30)

## 2011-03-01 MED ORDER — IOHEXOL 300 MG/ML  SOLN
100.0000 mL | Freq: Once | INTRAMUSCULAR | Status: AC | PRN
  Administered 2011-03-01: 100 mL via INTRAVENOUS

## 2011-03-02 LAB — URINE CULTURE
Colony Count: NO GROWTH
Culture: NO GROWTH

## 2011-03-17 ENCOUNTER — Encounter (INDEPENDENT_AMBULATORY_CARE_PROVIDER_SITE_OTHER): Payer: Self-pay

## 2011-03-17 ENCOUNTER — Ambulatory Visit (INDEPENDENT_AMBULATORY_CARE_PROVIDER_SITE_OTHER): Payer: Medicare Other | Admitting: General Surgery

## 2011-03-17 ENCOUNTER — Encounter (INDEPENDENT_AMBULATORY_CARE_PROVIDER_SITE_OTHER): Payer: Self-pay | Admitting: General Surgery

## 2011-03-17 VITALS — BP 128/60 | HR 82 | Temp 97.3°F | Resp 16 | Ht 65.0 in | Wt 166.0 lb

## 2011-03-17 DIAGNOSIS — I2510 Atherosclerotic heart disease of native coronary artery without angina pectoris: Secondary | ICD-10-CM

## 2011-03-17 DIAGNOSIS — K440 Diaphragmatic hernia with obstruction, without gangrene: Secondary | ICD-10-CM

## 2011-03-17 NOTE — Patient Instructions (Signed)
You have a large hiatal hernia, and most of your stomach has herniated up into her chest behind her heart. You also may have an esophageal stricture. You are advised to have this repaired surgically to prevent dangerous complications. Dr. Melvia Heaps will arrange for you to have an upper endoscopy and dilatation of your esophageal stricture at the same time your going to have a colonoscopy. We will schedule you for an upper GI. We will ask Dr. Marca Ancona  To  provide cardiac clearance for your surgery. I will see you back in the office after all of these tests are done and we will make a final decision about scheduling the surgery.

## 2011-03-17 NOTE — Progress Notes (Signed)
Chief Complaint  Patient presents with  . Other    Eval of hiatal hernia    HPI [name] [name] is a 75 y.o. female.    This is a 75 year old Caucasian female, referred to me by Dr. Lucky Cowboy for evaluation of a large sliding hiatal hernia. Dr. Melvia Heaps is her gastroenterologist. Dr. Marca Ancona is her cardiologist.  The patient does note she had a hiatal hernia for at least 10 years. She has been followed by Melvia Heaps for this as well as for anemia workups in the past. She's had reflux symptoms and has been on proton pump inhibitors. She intermittently has dysphagia problems. She's had 2 esophageal dilatations for stricture at the GE junction the past. The last of these was in 2010 and this helped her for at least a year. She is now back to having episodes of dysphagia and regurgitation about every 2 weeks. In between she actually swallows fairly well. She says she lost 10 or 12 pounds in the last 2 months.  She has significant problems with asthma. She has never smoked. She has coronary artery disease and had PTCA to the first diagonal 2003. His workup for anemia but that has resolved and no cancer has been found, history of hypertension, hyperlipidemia. A capsule endoscopy in 2010 was negative.  Last endoscopy was dilatation of stricture was in 2010. She is due for screening colonoscopy.  The precipitating event for this referral was a visit to the emergency department on March 01, 2011 for severe epigastric pain and some regurgitation. Lab work looked okay. Hemoglobin 15.3. CT scan showed the hiatal hernia but really didn't look any different than it had before. The pain resolved after a few hours and she was sent home. She discussed her care with Dr. Lynford Humphrey as as it was felt this was due to her hernia she was referred for surgical evaluation.  She is asymptomatic today.HPI  Past Medical History  Diagnosis Date  . CAD (coronary artery disease)     LHC 2003 with 40% pLAD,  30% mLAD, 100% D1 (moderate vessel), 100%  D2 (small vessel), 95% mid small OM2.  Pt had PTCA to D1;  D2 and OM2 small vessels and tx medically;  Last Myoview (5/09):  EF 66%, anteroseptal/apical infarct with minimal peri-infarct ischemia.  No significant change from 11/06. Pt managed medically  . Recurrent aspiration bronchitis/pneumonia     PFTs 12/09 FVC 97%, FEV1 101%, ratio 77%, TLC 109%. No significant obstruction or restriction.   . Hypertension   . Hyperlipidemia   . Esophageal stricture   . Diverticulosis of colon   . Gastroesophageal reflux disease with hiatal hernia   . Iron deficiency anemia   . Gastritis   . GERD (gastroesophageal reflux disease)   . Hiatal hernia   . Asthma   . Blood transfusion   . Cough   . Wheezing   . Weakness   . Trouble swallowing   . Abdominal pain     Past Surgical History  Procedure Date  . Back surgery   . Cholecystectomy   . Bladder repair   . Hemorrhoid surgery   . Cholecystectomy     Patient unsure of date.  [name] Appendectomy     patient unsure of date  . Total abdominal hysterectomy 1975    partial    Family History  Problem Relation Age of Onset  . Cancer Mother     colon   . Heart disease Father 59  heart attack    Social History History  Substance Use Topics  . Smoking status: Never Smoker   . Smokeless tobacco: Never Used  . Alcohol Use: No    Allergies  Allergen Reactions  . Aspartame     REACTION: N+V Severe diarrhea numbness around mouth    Current Outpatient Prescriptions  Medication Sig Dispense Refill  . QVAR 80 MCG/ACT inhaler 1 puff as needed.       . ALPRAZolam (XANAX) 0.5 MG tablet Take 0.5 mg by mouth at bedtime as needed.        [name] amLODipine (NORVASC) 10 MG tablet Take 5 mg by mouth daily.        [name] aspirin EC 81 MG tablet Take 1 tablet (81 mg total) by mouth daily.      . calcium carbonate (TUMS - DOSED IN MG ELEMENTAL CALCIUM) 500 MG chewable tablet Chew 1 tablet by mouth daily.        .  Cholecalciferol (VITAMIN D) 1000 UNITS capsule Take 1,000 Units by mouth daily.        . Cyanocobalamin (VITAMIN B 12 PO) Take by mouth.        . Dextromethorphan-Guaifenesin (MUCINEX DM PO) Take by mouth 2 (two) times daily.        [name] doxycycline (VIBRAMYCIN) 100 MG capsule 3-4 Days left       . enalapril (VASOTEC) 20 MG tablet Take 1 tablet by mouth Daily.        . Fluticasone-Salmeterol (ADVAIR DISKUS) 250-50 MCG/DOSE AEPB Inhale 1 puff into the lungs every 12 (twelve) hours.        [name] GNP GARLIC EXTRACT PO Take 600 mg by mouth daily.        . hydrochlorothiazide 25 MG tablet Take 1 tablet by mouth Daily.      [name] HYDROcodone-acetaminophen (VICODIN) 5-500 MG per tablet As needed      . ipratropium-albuterol (DUONEB) 0.5-2.5 (3) MG/3ML SOLN Take 3 mLs by nebulization 4 (four) times daily.  360 mL    . loratadine (CLARITIN) 10 MG tablet Take 10 mg by mouth daily.        . metoprolol (LOPRESSOR) 50 MG tablet Take 25 mg by mouth 2 (two) times daily.        . NON FORMULARY dalirest  UAD       . omeprazole (PRILOSEC) 20 MG capsule Take 1 tablet by mouth Daily.        . pantoprazole (PROTONIX) 40 MG tablet 40 mg daily.       . pravastatin (PRAVACHOL) 40 MG tablet Take 40 mg by mouth daily.          Review of Systems Review of Systems  Constitutional: Positive for unexpected weight change. Negative for fever and chills.  HENT: Negative for hearing loss, congestion, sore throat, trouble swallowing and voice change.   Eyes: Negative for visual disturbance.  Respiratory: Positive for choking and wheezing. Negative for cough.   Cardiovascular: Negative for chest pain, palpitations and leg swelling.  Gastrointestinal: Negative for nausea, vomiting, abdominal pain, diarrhea, constipation, blood in stool, abdominal distention and anal bleeding.  Genitourinary: Negative for hematuria, vaginal bleeding and difficulty urinating.  Musculoskeletal: Negative for arthralgias.  Skin: Negative for rash and  wound.  Neurological: Negative for seizures, syncope and headaches.  Hematological: Negative for adenopathy. Does not bruise/bleed easily.  Psychiatric/Behavioral: Negative for confusion. The patient is nervous/anxious.     Blood pressure 128/60, pulse 82, temperature 97.3 F (36.3 C),  temperature source Temporal, resp. rate 16, height 5\' 5"  (1.651 m), weight 166 lb (75.297 kg).  Physical Exam Physical Exam  Constitutional: She is oriented to person, place, and time. She appears well-developed and well-nourished. No distress.  HENT:  Head: Normocephalic and atraumatic.  Nose: Nose normal.  Mouth/Throat: No oropharyngeal exudate.       dentures  Eyes: Conjunctivae and EOM are normal. Pupils are equal, round, and reactive to light. Left eye exhibits no discharge. No scleral icterus.  Neck: Neck supple. No JVD present. No tracheal deviation present. No thyromegaly present.  Cardiovascular: Normal rate, regular rhythm, normal heart sounds and intact distal pulses.   No murmur heard. Pulmonary/Chest: Effort normal. No respiratory distress. She has wheezes. She has no rales. She exhibits no tenderness.  Abdominal: Soft. Bowel sounds are normal. She exhibits no distension and no mass. There is no tenderness. There is no rebound and no guarding.    Musculoskeletal: She exhibits no edema and no tenderness.  Lymphadenopathy:    She has no cervical adenopathy.  Neurological: She is alert and oriented to person, place, and time. She exhibits normal muscle tone. Coordination normal.  Skin: Skin is warm. No rash noted. She is not diaphoretic. No erythema. No pallor.  Psychiatric: She has a normal mood and affect. Her behavior is normal. Judgment and thought content normal.    Data Reviewed I discussed her care with Dr. Melvia Heaps. I've reviewed records from all of her numerous physicians and I reviewed reviewed the emergency department records and the endoscopies in the past and recent CT  scans.  Assessment    Large sliding hiatal hernia, probably type III. Suspect intermittent torsion and obstruction.  Suspect recurrent esophageal stricture.  Status post cholecystectomy. Also status post appendectomy.  Coronary artery disease, status post PTCA first diagonal 2003.  Aspirin use  Asthma and bronchitis, with ongoing symptoms  History of anemia, resolved  Hypertension  Anxiety     Plan    She is advised that she should undergo surgery to repair her hiatal hernia. This would involve reduction of the hernia back into the abdominal cavity, closure of the diaphragmatic defect. We may or may not do an antireflux procedure at the same time.  She is advised to see Dr. [name] for her screening colonoscopy. He is going to do an upper endoscopy and esophageal dilatation if indicated at that time.  She is going to see Dr. Marca Ancona for cardiac clearance for the surgery  A limited upper GI to get an anatomic roadmap of the surgery.  I'll see her back in 3-4 weeks after all this is done for final discussion and scheduling her for surgery.       [name] [name] M 03/17/2011, 9:14 AM

## 2011-03-21 ENCOUNTER — Telehealth: Payer: Self-pay | Admitting: Gastroenterology

## 2011-03-21 NOTE — Telephone Encounter (Signed)
Per Dr Jacinto Halim note, pt is to have screening COLON and EGD with possible dilation prior to her hernia surgery. Pt sees Dr Derrell Lolling again 04/14/11. Is a direct ECL OK and with Mac or conscious sedation? Thanks.

## 2011-03-22 ENCOUNTER — Ambulatory Visit (INDEPENDENT_AMBULATORY_CARE_PROVIDER_SITE_OTHER): Payer: Medicare Other | Admitting: Physician Assistant

## 2011-03-22 ENCOUNTER — Encounter: Payer: Self-pay | Admitting: Physician Assistant

## 2011-03-22 VITALS — BP 128/70 | HR 64 | Ht 64.0 in | Wt 164.4 lb

## 2011-03-22 DIAGNOSIS — K449 Diaphragmatic hernia without obstruction or gangrene: Secondary | ICD-10-CM

## 2011-03-22 DIAGNOSIS — I2510 Atherosclerotic heart disease of native coronary artery without angina pectoris: Secondary | ICD-10-CM

## 2011-03-22 DIAGNOSIS — R079 Chest pain, unspecified: Secondary | ICD-10-CM

## 2011-03-22 NOTE — Telephone Encounter (Signed)
Pt scheduled for ECL with propofol for 04/07/11@8am . Previsit scheduled for 03/25/11@10am . Pt aware of appt dates and times.

## 2011-03-22 NOTE — Patient Instructions (Signed)
Your physician wants you to follow-up in: 2 MONTHS WITH DR. Shirlee Latch You will receive a reminder letter in the mail two months in advance. If you don't receive a letter, please call our office to schedule the follow-up appointment.  Your physician has requested that you have en exercise stress myoview DX [phone] CHEST PAIN . For further information please visit [email or web address] Please follow instruction sheet, as given.

## 2011-03-22 NOTE — Assessment & Plan Note (Signed)
Pending surgery.  Will obtain myoview prior to clearing as noted.

## 2011-03-22 NOTE — Assessment & Plan Note (Signed)
Probably related to hiatal hernia.  But she does have CAD.  Will arrange ETT-myoview prior to clearing for surgery.  Discussed with Dr. Marca Ancona who agrees.

## 2011-03-22 NOTE — Assessment & Plan Note (Signed)
Proceed with myoview as noted.  

## 2011-03-22 NOTE — Progress Notes (Signed)
History of Present Illness: PCP: Dr. Oneta Rack Primary Cardiologist:  Dr. Marca Ancona   [name] [name] is a 75 y.o. female with a h/o CAD s/p PTCA to D1 in 2003. She has had problems with recurrent asthma/bronchitis flares.  She was seen in this office in 6/12 by me with complaints of shortness of breath and pleuritic chest pain.  She was wheezing on exam.  She was set up for an echo and a myoview but did not have these done.  Since then, she was treated for asthma/bronchitis with nebulized albuterol and atrovent was feeling much better when she saw Dr. Shirlee Latch in 7/12.  Metoprolol was stopped due to concern that it could be perpetuating bronchospasm.    She has had investigations into the source of her Fe-deficiency anemia.  She had a mesenteric angiogram in 1/11 that was unrevealing.   She was recently evaluated for epigastric and lower sternal pain in the ED 10/23.  CEs were negative and CT demonstrated a large hiatal hernia.  She has seen Dr. Derrell Lolling and it is felt that she needs repair of her hiatal hernia.  She notes chest pain off and on over the years that she attributes to her hiatal hernia.  She notes it is getting worse.  She denies any radiating symptoms or associated dyspnea.  She notes that she can exert herself without chest pain.  She can rake or vacuum.  But, she also notes chest pain at times during exertion (not necessarily caused by exertion).  She cannot attribute all of her symptoms to meals.  No syncope, orthopnea, PND or edema.  No palpitations.    Labs (5/10): LDL 69, HDL 75  Labs (6/12): LDL 56, HDL 87, BNP 30, HCT 42.4  Past Medical History:  1. CAD: LHC 2003 with 40% pLAD, 30% mLAD, 100% D1 (moderate vessel), 100% D2 (small vessel), 95% mid small OM2. Pt had PTCA to D1. Last myoview (5/09): EF 66%, anteroseptal/apical infarct with minimal peri-infarct ischemia. No significant change from 11/06. Pt managed medically.  Echo 8/12: EF 55-65%, grade 1 diast dysfxn, mild LAE,  increased septal thickness c/w lipomatous hypertrophy, PASP 35. 2. Recurrent bronchitis, suspect asthma: PFTs 12/09 FVC 97%, FEV1 101%, ratio 77%, TLC 109%. No significant obstruction or restriction.  Never smoked.  3. HYPERTENSION (ICD-401.9)  4. HYPERLIPIDEMIA (ICD-272.4)  5. ESOPHAGEAL STRICTURE (ICD-530.3)  6. DIVERTICULOSIS, COLON (ICD-562.10)  7. HIATAL HERNIA WITH REFLUX (ICD-553.3)  8. IRON DEFICIENCY ANEMIA, HX OF (ICD-V12.3): EGD, colonoscopy, and capsule endoscopy in fall 2010 unrevealing for source. She then had mesenteric angiogram in 1/11 with no bleeding source found.  ? small bowel AVMs.    Current Outpatient Prescriptions  Medication Sig Dispense Refill  . ALPRAZolam (XANAX) 0.5 MG tablet Take 0.5 mg by mouth at bedtime as needed.        [name] amLODipine (NORVASC) 10 MG tablet Take 5 mg by mouth daily.        [name] aspirin EC 81 MG tablet Take 1 tablet (81 mg total) by mouth daily.      . calcium carbonate (TUMS - DOSED IN MG ELEMENTAL CALCIUM) 500 MG chewable tablet Chew 1 tablet by mouth daily.        . Cholecalciferol (VITAMIN D) 1000 UNITS capsule Take 6,000 Units by mouth daily.       . Cyanocobalamin (VITAMIN B 12 PO) Take by mouth.        . Dextromethorphan-Guaifenesin (MUCINEX DM PO) Take by mouth as needed.       [name]  enalapril (VASOTEC) 20 MG tablet Take 1 tablet by mouth Daily.        [name] GNP GARLIC EXTRACT PO Take 600 mg by mouth daily.        . hydrochlorothiazide 25 MG tablet Take 1 tablet by mouth Daily.      [name] HYDROcodone-acetaminophen (VICODIN) 5-500 MG per tablet As needed      . ipratropium-albuterol (DUONEB) 0.5-2.5 (3) MG/3ML SOLN Take 3 mLs by nebulization 4 (four) times daily.  360 mL    . pantoprazole (PROTONIX) 40 MG tablet 40 mg daily.       . pravastatin (PRAVACHOL) 40 MG tablet Take 40 mg by mouth daily.        [name] QVAR 80 MCG/ACT inhaler 1 puff as needed.       [name] azithromycin (ZITHROMAX) 250 MG tablet Has no started        Allergies: Allergies  Allergen  Reactions  . Aspartame     REACTION: N+V Severe diarrhea numbness around mouth    History  Substance Use Topics  . Smoking status: Never Smoker   . Smokeless tobacco: Never Used  . Alcohol Use: No     ROS:  Please see the history of present illness.    All other systems reviewed and negative.   Vital Signs: BP 128/70  Pulse 64  Ht 5\' 4"  (1.626 m)  Wt 164 lb 6.4 oz (74.571 kg)  BMI 28.22 kg/m2  PHYSICAL EXAM: Well nourished, well developed, in no acute distress HEENT: normal Neck: no JVD Vascular: no carotid bruits Cardiac:  normal S1, S2; RRR; no murmur Lungs:  Decreased breath sounds bilaterally, exp wheezing scattered throughout Abd: soft, nontender, no hepatomegaly Ext: no edema Skin: warm and dry Neuro:  CNs 2-12 intact, no focal abnormalities noted  EKG:   NSR, HR 64, leftward axis, PRWP, NSSTTW changes  ASSESSMENT AND PLAN:

## 2011-03-22 NOTE — Telephone Encounter (Signed)
OK for direct EGD/colo with propofol

## 2011-03-23 ENCOUNTER — Encounter (HOSPITAL_COMMUNITY): Payer: Self-pay | Admitting: Physician Assistant

## 2011-03-25 ENCOUNTER — Encounter: Payer: Self-pay | Admitting: Gastroenterology

## 2011-03-25 ENCOUNTER — Encounter: Payer: Medicare Other | Admitting: Gastroenterology

## 2011-03-25 ENCOUNTER — Ambulatory Visit (AMBULATORY_SURGERY_CENTER): Payer: Medicare Other | Admitting: *Deleted

## 2011-03-25 VITALS — Ht 65.0 in | Wt 165.2 lb

## 2011-03-25 DIAGNOSIS — Z800 Family history of malignant neoplasm of digestive organs: Secondary | ICD-10-CM

## 2011-03-25 DIAGNOSIS — K440 Diaphragmatic hernia with obstruction, without gangrene: Secondary | ICD-10-CM

## 2011-03-25 DIAGNOSIS — Z1211 Encounter for screening for malignant neoplasm of colon: Secondary | ICD-10-CM

## 2011-03-25 MED ORDER — POLYETHYLENE GLYCOL 3350 17 G PO PACK: PACK | ORAL | Status: DC

## 2011-03-25 MED ORDER — METOCLOPRAMIDE HCL 10 MG PO TABS: 10.0000 mg | ORAL_TABLET | ORAL | Status: DC

## 2011-03-25 MED ORDER — BISACODYL 5 MG PO TBEC: DELAYED_RELEASE_TABLET | ORAL | Status: DC

## 2011-03-25 MED ORDER — POLYETHYLENE GLYCOL 3350 17 GM/SCOOP PO POWD: ORAL | Status: DC

## 2011-03-25 NOTE — Progress Notes (Signed)
Patient unable to use the moviprep due to allergy to aspartame. Miralax prep given and reviewed with patient.

## 2011-03-29 ENCOUNTER — Ambulatory Visit (HOSPITAL_COMMUNITY): Payer: Medicare Other | Attending: Cardiovascular Disease | Admitting: Radiology

## 2011-03-29 VITALS — Ht 64.0 in | Wt 161.0 lb

## 2011-03-29 DIAGNOSIS — R5381 Other malaise: Secondary | ICD-10-CM | POA: Insufficient documentation

## 2011-03-29 DIAGNOSIS — Z01810 Encounter for preprocedural cardiovascular examination: Secondary | ICD-10-CM | POA: Insufficient documentation

## 2011-03-29 DIAGNOSIS — Z9861 Coronary angioplasty status: Secondary | ICD-10-CM | POA: Insufficient documentation

## 2011-03-29 DIAGNOSIS — R002 Palpitations: Secondary | ICD-10-CM | POA: Insufficient documentation

## 2011-03-29 DIAGNOSIS — E785 Hyperlipidemia, unspecified: Secondary | ICD-10-CM | POA: Insufficient documentation

## 2011-03-29 DIAGNOSIS — R0602 Shortness of breath: Secondary | ICD-10-CM

## 2011-03-29 DIAGNOSIS — K449 Diaphragmatic hernia without obstruction or gangrene: Secondary | ICD-10-CM | POA: Insufficient documentation

## 2011-03-29 DIAGNOSIS — R5383 Other fatigue: Secondary | ICD-10-CM | POA: Insufficient documentation

## 2011-03-29 DIAGNOSIS — R072 Precordial pain: Secondary | ICD-10-CM | POA: Insufficient documentation

## 2011-03-29 DIAGNOSIS — I10 Essential (primary) hypertension: Secondary | ICD-10-CM | POA: Insufficient documentation

## 2011-03-29 DIAGNOSIS — R079 Chest pain, unspecified: Secondary | ICD-10-CM

## 2011-03-29 DIAGNOSIS — R1013 Epigastric pain: Secondary | ICD-10-CM | POA: Insufficient documentation

## 2011-03-29 MED ORDER — REGADENOSON 0.4 MG/5ML IV SOLN
0.4000 mg | Freq: Once | INTRAVENOUS | Status: AC
  Administered 2011-03-29: 0.4 mg via INTRAVENOUS

## 2011-03-29 MED ORDER — TECHNETIUM TC 99M TETROFOSMIN IV KIT
11.0000 | PACK | Freq: Once | INTRAVENOUS | Status: AC | PRN
  Administered 2011-03-29: 11 via INTRAVENOUS

## 2011-03-29 MED ORDER — TECHNETIUM TC 99M TETROFOSMIN IV KIT
33.0000 | PACK | Freq: Once | INTRAVENOUS | Status: AC | PRN
  Administered 2011-03-29: 33 via INTRAVENOUS

## 2011-03-29 NOTE — Progress Notes (Addendum)
[facility] SITE 3 NUCLEAR MED [address] [place] [address] [phone]  Cardiology Nuclear Med Study  [name] [name] is a 75 y.o. female [id number] 1936/03/28   Nuclear Med Background Indication for Stress Test:  Evaluation for Ischemia, PTCA Patency, 03/01/11 Post Hospital ED: Epigastric and lower sternal pain,abnormal EKG, CT showed large Hiatal Hernia,Surgical Clearance: Pending Hiatal Hernia Repair by Dr. Claud Kelp History: '03 Angioplasty, Asthma, 08/12 Echo: EF 55-65% mild LAE, PASP-35, '03 Heart Catheterization: EF 60% and '09 Myocardial Perfusion Study: EF 66% Anteroseptal infarct with minimal peri-infarct ischemia  Cardiac Risk Factors: Hypertension and Lipids  Symptoms:  Chest Pain, Fatigue, Palpitations and SOB   Nuclear Pre-Procedure Caffeine/Decaff Intake:  None NPO After: 6:00pm   Lungs:  clear IV 0.9% NS with Angio Cath:  22g  IV Site: L Antecubital x 1, tolerated well IV Started by:  Irean Hong, RN  Chest Size (in):  40 Cup Size: B  Height: 5\' 4"  (1.626 m)  Weight:  161 lb (73.029 kg)  BMI:  Body mass index is 27.64 kg/(m^2). Tech Comments:  Nebulizer treatment at 6:30 am per patient    Nuclear Med Study 1 or 2 day study: 1 day  Stress Test Type:  Treadmill/Lexiscan  Reading MD: Charlton Haws, MD  Order Authorizing Provider:  Marca Ancona, MD, Tereso Newcomer, [facility]  Resting Radionuclide: Technetium 69m Tetrofosmin  Resting Radionuclide Dose: 11.0 mCi   Stress Radionuclide:  Technetium 57m Tetrofosmin  Stress Radionuclide Dose: 33.0 mCi           Stress Protocol Rest HR: 96 Stress HR: 146  Rest BP: 132/75 Stress BP: 177/73  Exercise Time (min): n/a METS: n/a   Predicted Max HR: 147 bpm % Max HR: 99.32 bpm Rate Pressure Product: 21308   Dose of Adenosine (mg):  n/a Dose of Lexiscan: 0.4 mg  Dose of Atropine (mg): n/a Dose of Dobutamine: n/a mcg/kg/min (at max HR)  Stress Test Technologist: Milana Na, EMT-P  Nuclear  Technologist:  Doyne Keel, CNMT     Rest Procedure:  Myocardial perfusion imaging was performed at rest 45 minutes following the intravenous administration of Technetium 59m Tetrofosmin. Rest ECG: NSR  Stress Procedure:  The patient received IV Lexiscan 0.4 mg over 15-seconds.  Technetium 50m Tetrofosmin injected at 30-seconds.  There were no significant changes, + Lt. Headed, and rare pvcs with Lexiscan.  Quantitative spect images were obtained after a 45 minute delay. Stress ECG: No significant change from baseline ECG  QPS Raw Data Images:  Normal; no motion artifact; normal heart/lung ratio. Stress Images:  There is decreased uptake in the anterior wall. Rest Images:  There is decreased uptake in the anterior wall. Subtraction (SDS):  There is a fixed defect that is most consistent with a previous infarction. Transient Ischemic Dilatation (Normal <1.22):  0.89 Lung/Heart Ratio (Normal <0.45):  0.26  Quantitative Gated Spect Images QGS EDV:  60 ml QGS ESV:  19 ml QGS cine images:  EF 68% but septal and mid anteior wall hypokinesis QGS EF: 68%  Impression Exercise Capacity:  Lexiscan with low level exercise. BP Response:  Normal blood pressure response. Clinical Symptoms:  No chest pain. ECG Impression:  No significant ST segment change suggestive of ischemia. Comparison with Prior Nuclear Study: No images to compare  Overall Impression:  Small area of midanterior wall and anteroapical wall infarct with no ischemia.  Suggest echo for accurate EF   [employer] area of anterior infarct.  Normal EF.  No ischemia.   Dalton [employer]

## 2011-03-30 NOTE — Progress Notes (Signed)
Patient was called and told nuclear study shows heart muscle good and good blood flow

## 2011-04-04 ENCOUNTER — Telehealth: Payer: Self-pay | Admitting: Cardiology

## 2011-04-04 NOTE — Telephone Encounter (Signed)
New Problem:    PAtient claims that she spoke to you and that you wanted her to call back.  Her surgeon is Claud Kelp with [facility] phone: [phone] fax: [phone]

## 2011-04-04 NOTE — Telephone Encounter (Signed)
lmom for that I faxed test results over to Dr. Derrell Lolling today. Danielle Rankin

## 2011-04-04 NOTE — Telephone Encounter (Signed)
lmom for pt that I faxed results over to Dr. Derrell Lolling today. Danielle Rankin

## 2011-04-07 ENCOUNTER — Ambulatory Visit (AMBULATORY_SURGERY_CENTER): Payer: Medicare Other | Admitting: Gastroenterology

## 2011-04-07 ENCOUNTER — Encounter: Payer: Self-pay | Admitting: Gastroenterology

## 2011-04-07 VITALS — BP 148/77 | HR 84 | Temp 96.5°F | Resp 20 | Ht 65.0 in | Wt 165.0 lb

## 2011-04-07 DIAGNOSIS — R1310 Dysphagia, unspecified: Secondary | ICD-10-CM

## 2011-04-07 DIAGNOSIS — D126 Benign neoplasm of colon, unspecified: Secondary | ICD-10-CM

## 2011-04-07 DIAGNOSIS — K440 Diaphragmatic hernia with obstruction, without gangrene: Secondary | ICD-10-CM

## 2011-04-07 DIAGNOSIS — Z800 Family history of malignant neoplasm of digestive organs: Secondary | ICD-10-CM

## 2011-04-07 DIAGNOSIS — Z1211 Encounter for screening for malignant neoplasm of colon: Secondary | ICD-10-CM

## 2011-04-07 DIAGNOSIS — K648 Other hemorrhoids: Secondary | ICD-10-CM

## 2011-04-07 DIAGNOSIS — K222 Esophageal obstruction: Secondary | ICD-10-CM

## 2011-04-07 MED ORDER — SODIUM CHLORIDE 0.9 % IV SOLN: 500.0000 mL | INTRAVENOUS | Status: DC

## 2011-04-07 NOTE — Patient Instructions (Addendum)
See the picture page for your findings from your exam today.  Follow the green and blue discharge instruction sheets the rest of the day.  Resume your prior medications today. Please call if any questions or concerns.    Patient did not experience any of the following events: a burn prior to discharge; a fall within the facility; wrong site/side/patient/procedure/implant event; or a hospital transfer or hospital admission upon discharge from the facility. [phone]) Patient did not have preoperative order for IV antibiotic SSI prophylaxis. [phone])

## 2011-04-07 NOTE — Progress Notes (Signed)
No complaints noted in the recovery room. Maw  Patient did not experience any of the following events: a burn prior to discharge; a fall within the facility; wrong site/side/patient/procedure/implant event; or a hospital transfer or hospital admission upon discharge from the facility. ([id number]) Patient did not have preoperative order for IV antibiotic SSI prophylaxis. ([id number])  

## 2011-04-08 ENCOUNTER — Ambulatory Visit
Admission: RE | Admit: 2011-04-08 | Discharge: 2011-04-08 | Disposition: A | Discharge status: Home / Self Care | Payer: Medicare Other | Source: Ambulatory Visit | Attending: General Surgery | Admitting: General Surgery

## 2011-04-08 ENCOUNTER — Other Ambulatory Visit (INDEPENDENT_AMBULATORY_CARE_PROVIDER_SITE_OTHER): Payer: Self-pay | Admitting: General Surgery

## 2011-04-08 ENCOUNTER — Telehealth: Payer: Self-pay | Admitting: *Deleted

## 2011-04-08 DIAGNOSIS — K440 Diaphragmatic hernia with obstruction, without gangrene: Secondary | ICD-10-CM

## 2011-04-08 NOTE — Telephone Encounter (Signed)
Home and mobile numbers do not match the callback number the pt gave in the admitting area.  Ok to leave message was not received. No message left

## 2011-04-12 ENCOUNTER — Telehealth: Payer: Self-pay | Admitting: Cardiology

## 2011-04-12 NOTE — Telephone Encounter (Signed)
New problem:  Per Arline Asp need a clearance note- stating patient is cleared for general anesthesia.

## 2011-04-13 NOTE — Telephone Encounter (Signed)
She is cleared for general anesthesia.

## 2011-04-13 NOTE — Telephone Encounter (Signed)
I faxed this note to Dr Derrell Lolling.

## 2011-04-14 ENCOUNTER — Ambulatory Visit (INDEPENDENT_AMBULATORY_CARE_PROVIDER_SITE_OTHER): Payer: Medicare Other | Admitting: General Surgery

## 2011-04-14 ENCOUNTER — Other Ambulatory Visit (INDEPENDENT_AMBULATORY_CARE_PROVIDER_SITE_OTHER): Payer: Self-pay | Admitting: General Surgery

## 2011-04-14 ENCOUNTER — Encounter (INDEPENDENT_AMBULATORY_CARE_PROVIDER_SITE_OTHER): Payer: Self-pay | Admitting: General Surgery

## 2011-04-14 VITALS — BP 124/72 | HR 76 | Temp 96.9°F | Resp 16 | Ht 65.0 in | Wt 163.6 lb

## 2011-04-14 DIAGNOSIS — K219 Gastro-esophageal reflux disease without esophagitis: Secondary | ICD-10-CM

## 2011-04-14 NOTE — Progress Notes (Signed)
Patient ID: [name] [name], female   DOB: 01-05-1936, 75 y.o.   MRN: [id number]  Chief Complaint  Patient presents with  . Other    Recheck hiatal hernia    HPI [name] [name] is a 75 y.o. female. She is here for a preop evaluation and scheduling for surgery for her hiatal hernia and reflux.  She is here for a preop evaluation and scheduling for surgery for her had hiatal hernia and reflux.  Recall that she is Chrissie Noa McKeown's's patient. She's had an upper GI which shows a moderately large sliding hiatal hernia and  poor esophageal motility. She has also recently had upper endoscopy by  Dr. [name] which shows a 7 cm hiatal hernia and a stricture which was dilated and s otherwise negative. She also received colonoscopy which showed some tiny polyps but no serious disease.  She had a cardiac evaluation by Dr. Shirlee Latch and he has cleared her for surgery.  She has lost about 12 pounds. She is a little bit afraid to eat but really hasn't had any more of the bad pain attacks that she had in the past. She denies dysphagia. She still has significant problems with asthma. She has never smoked. She has coronary artery disease and has had PTCA in 2003.   HPI  Past Medical History  Diagnosis Date  . CAD (coronary artery disease)     LHC 2003 with 40% pLAD, 30% mLAD, 100% D1 (moderate vessel), 100%  D2 (small vessel), 95% mid small OM2.  Pt had PTCA to D1;  D2 and OM2 small vessels and tx medically;  Last Myoview (5/09):  EF 66%, anteroseptal/apical infarct with minimal peri-infarct ischemia.  No significant change from 11/06. Pt managed medically  . Recurrent aspiration bronchitis/pneumonia     PFTs 12/09 FVC 97%, FEV1 101%, ratio 77%, TLC 109%. No significant obstruction or restriction.   . Hypertension   . Hyperlipidemia   . Esophageal stricture   . Diverticulosis of colon   . Gastroesophageal reflux disease with hiatal hernia   . Iron deficiency anemia   . Gastritis   . GERD (gastroesophageal  reflux disease)   . Hiatal hernia   . Asthma   . Blood transfusion   . Cough   . Wheezing   . Weakness   . Trouble swallowing   . Abdominal pain   . Hiatal hernia     Past Surgical History  Procedure Date  . Back surgery   . Cholecystectomy   . Bladder repair   . Hemorrhoid surgery   . Cholecystectomy     Patient unsure of date.  [name] Appendectomy     patient unsure of date  . Total abdominal hysterectomy 1975    partial    Family History  Problem Relation Age of Onset  . Cancer Mother     colon   . Colon cancer Mother 64  . Heart disease Father 56    heart attack    Social History History  Substance Use Topics  . Smoking status: Never Smoker   . Smokeless tobacco: Never Used  . Alcohol Use: No    Allergies  Allergen Reactions  . Aspartame Diarrhea and Nausea And Vomiting    Current Outpatient Prescriptions  Medication Sig Dispense Refill  . ALPRAZolam (XANAX) 0.5 MG tablet Take 0.5 mg by mouth at bedtime as needed.        [name] amLODipine (NORVASC) 10 MG tablet Take 5 mg by mouth daily.        [name]  aspirin EC 81 MG tablet Take 1 tablet (81 mg total) by mouth daily.      [name] azithromycin (ZITHROMAX) 250 MG tablet Has no started      . calcium carbonate (TUMS - DOSED IN MG ELEMENTAL CALCIUM) 500 MG chewable tablet Chew 1 tablet by mouth daily.        . Cholecalciferol (VITAMIN D) 1000 UNITS capsule Take 6,000 Units by mouth daily.       . Cyanocobalamin (VITAMIN B 12 PO) Take by mouth.        . Dextromethorphan-Guaifenesin (MUCINEX DM PO) Take by mouth as needed.       . enalapril (VASOTEC) 20 MG tablet Take 1 tablet by mouth Daily.        [name] GNP GARLIC EXTRACT PO Take 600 mg by mouth daily.        . hydrochlorothiazide 25 MG tablet Take 1 tablet by mouth Daily.      [name] HYDROcodone-acetaminophen (VICODIN) 5-500 MG per tablet As needed      . ipratropium-albuterol (DUONEB) 0.5-2.5 (3) MG/3ML SOLN Take 3 mLs by nebulization 4 (four) times daily.  360 mL    . pantoprazole  (PROTONIX) 40 MG tablet 40 mg daily.       . pravastatin (PRAVACHOL) 40 MG tablet Take 40 mg by mouth daily.        [name] QVAR 80 MCG/ACT inhaler 1 puff as needed.         Review of Systems Review of Systems  Constitutional: Positive for unexpected weight change.  Respiratory: Positive for choking and wheezing.   Cardiovascular: Positive for chest pain.   12 system review of systems is performed and is negative except as described above. Blood pressure 124/72, pulse 76, temperature 96.9 F (36.1 C), temperature source Temporal, resp. rate 16, height 5\' 5"  (1.651 m), weight 163 lb 9.6 oz (74.208 kg).  Physical Exam Physical Exam  Constitutional: She is oriented to person, place, and time. She appears well-developed and well-nourished. No distress.  HENT:  Head: Normocephalic.  Nose: Nose normal.  Mouth/Throat: No oropharyngeal exudate.  Eyes: Conjunctivae and EOM are normal. Pupils are equal, round, and reactive to light. Right eye exhibits no discharge. Left eye exhibits no discharge. No scleral icterus.  Neck: Normal range of motion. Neck supple. No JVD present. No tracheal deviation present. No thyromegaly present.  Cardiovascular: Normal rate, regular rhythm, normal heart sounds and intact distal pulses.   No murmur heard. Pulmonary/Chest: Effort normal. No stridor. No respiratory distress. She has wheezes. She has no rales. She exhibits no tenderness.  Abdominal: Soft. Bowel sounds are normal. She exhibits no distension and no mass. There is no tenderness. There is no rebound and no guarding.    Musculoskeletal: Normal range of motion. She exhibits no edema and no tenderness.  Lymphadenopathy:    She has no cervical adenopathy.  Neurological: She is alert and oriented to person, place, and time. She has normal reflexes. Coordination normal.  Skin: Skin is warm and dry. No rash noted. She is not diaphoretic. No erythema. No pallor.  Psychiatric: She has a normal mood and affect. Her  behavior is normal. Judgment and thought content normal.       Appears a little anxious    Data Reviewed  I have reviewed Dr. Marzetta Board colonoscopy report and upper endoscopy report. I have reviewed the upper GI films. I have reviewed Dr. Freida Busman McClean's cardiac evaluation. Assessment    Large, sliding hiatal hernia, complicated by stricture,  reflux, and symptoms consistent with intermittent obstruction.  Coronary disease, status post PTCA 2003  Status post laparoscopic cholecystectomy  Chest post open appendectomy  Asthma and bronchitis, with ongoing symptoms  Hypertension  Anxiety    Plan    The patient would like to proceed with repair of her hiatal hernia and Nissen fundoplication. We will schedule that in the near future.  I discussed the indications and details of surgery with her and her husband. Risks and complications have been outlined, including but not limited to bleeding, infection, conversion to open laparotomy, splenectomy, pneumothorax, leak, obstruction, cardiac pulmonary and thromboembolic problems. She understands these issues. Her questions were answered. She agrees with this plan.  She will discontinue her aspirin a few days preop       Dedria Endres M 04/14/2011, 12:44 PM

## 2011-04-14 NOTE — Patient Instructions (Signed)
We have reviewed all of your x-ray tests, and endoscopy reports, and Dr. Theodis Shove evaluation. You will be scheduled for surgery to repair your hiatal hernia and perform a Nissen fundoplication. We will do this at [facility].

## 2011-04-18 ENCOUNTER — Telehealth (INDEPENDENT_AMBULATORY_CARE_PROVIDER_SITE_OTHER): Payer: Self-pay

## 2011-04-18 NOTE — Telephone Encounter (Signed)
LMOM for pt re: pre op appt for 05-27-11 arrive at 4:00pm

## 2011-04-26 ENCOUNTER — Inpatient Hospital Stay (HOSPITAL_COMMUNITY)
Admission: EM | Admit: 2011-04-26 | Discharge: 2011-04-28 | DRG: 392 | Disposition: A | Discharge status: Home / Self Care | Payer: Medicare Other | Source: Ambulatory Visit | Attending: Internal Medicine | Admitting: Internal Medicine

## 2011-04-26 ENCOUNTER — Encounter (HOSPITAL_COMMUNITY): Payer: Self-pay | Admitting: Emergency Medicine

## 2011-04-26 ENCOUNTER — Emergency Department (HOSPITAL_COMMUNITY): Payer: Medicare Other

## 2011-04-26 ENCOUNTER — Other Ambulatory Visit: Payer: Self-pay

## 2011-04-26 DIAGNOSIS — J209 Acute bronchitis, unspecified: Secondary | ICD-10-CM

## 2011-04-26 DIAGNOSIS — K2990 Gastroduodenitis, unspecified, without bleeding: Secondary | ICD-10-CM

## 2011-04-26 DIAGNOSIS — R079 Chest pain, unspecified: Secondary | ICD-10-CM

## 2011-04-26 DIAGNOSIS — J4489 Other specified chronic obstructive pulmonary disease: Secondary | ICD-10-CM | POA: Diagnosis present

## 2011-04-26 DIAGNOSIS — J449 Chronic obstructive pulmonary disease, unspecified: Secondary | ICD-10-CM | POA: Diagnosis present

## 2011-04-26 DIAGNOSIS — A09 Infectious gastroenteritis and colitis, unspecified: Principal | ICD-10-CM | POA: Diagnosis present

## 2011-04-26 DIAGNOSIS — J45909 Unspecified asthma, uncomplicated: Secondary | ICD-10-CM

## 2011-04-26 DIAGNOSIS — E785 Hyperlipidemia, unspecified: Secondary | ICD-10-CM | POA: Diagnosis present

## 2011-04-26 DIAGNOSIS — I10 Essential (primary) hypertension: Secondary | ICD-10-CM | POA: Diagnosis present

## 2011-04-26 DIAGNOSIS — D72829 Elevated white blood cell count, unspecified: Secondary | ICD-10-CM | POA: Diagnosis present

## 2011-04-26 DIAGNOSIS — K449 Diaphragmatic hernia without obstruction or gangrene: Secondary | ICD-10-CM

## 2011-04-26 DIAGNOSIS — K529 Noninfective gastroenteritis and colitis, unspecified: Secondary | ICD-10-CM

## 2011-04-26 DIAGNOSIS — J4520 Mild intermittent asthma, uncomplicated: Secondary | ICD-10-CM | POA: Diagnosis present

## 2011-04-26 DIAGNOSIS — K5730 Diverticulosis of large intestine without perforation or abscess without bleeding: Secondary | ICD-10-CM | POA: Diagnosis present

## 2011-04-26 DIAGNOSIS — D509 Iron deficiency anemia, unspecified: Secondary | ICD-10-CM | POA: Diagnosis present

## 2011-04-26 DIAGNOSIS — K222 Esophageal obstruction: Secondary | ICD-10-CM | POA: Diagnosis present

## 2011-04-26 DIAGNOSIS — Z862 Personal history of diseases of the blood and blood-forming organs and certain disorders involving the immune mechanism: Secondary | ICD-10-CM

## 2011-04-26 DIAGNOSIS — R930 Abnormal findings on diagnostic imaging of skull and head, not elsewhere classified: Secondary | ICD-10-CM

## 2011-04-26 DIAGNOSIS — K922 Gastrointestinal hemorrhage, unspecified: Secondary | ICD-10-CM

## 2011-04-26 DIAGNOSIS — I2510 Atherosclerotic heart disease of native coronary artery without angina pectoris: Secondary | ICD-10-CM

## 2011-04-26 DIAGNOSIS — R0602 Shortness of breath: Secondary | ICD-10-CM

## 2011-04-26 DIAGNOSIS — M546 Pain in thoracic spine: Secondary | ICD-10-CM

## 2011-04-26 LAB — PROTIME-INR
INR: 0.84 (ref 0.00–1.49)
Prothrombin Time: 11.7 seconds (ref 11.6–15.2)

## 2011-04-26 LAB — COMPREHENSIVE METABOLIC PANEL
AST: 18 U/L (ref 0–37)
Albumin: 4.5 g/dL (ref 3.5–5.2)
Alkaline Phosphatase: 71 U/L (ref 39–117)
BUN: 22 mg/dL (ref 6–23)
CO2: 23 mEq/L (ref 19–32)
Chloride: 99 mEq/L (ref 96–112)
Potassium: 3.7 mEq/L (ref 3.5–5.1)
Total Bilirubin: 0.8 mg/dL (ref 0.3–1.2)

## 2011-04-26 LAB — DIFFERENTIAL
Basophils Absolute: 0 10*3/uL (ref 0.0–0.1)
Basophils Relative: 0 % (ref 0–1)
Eosinophils Relative: 0 % (ref 0–5)
Lymphocytes Relative: 6 % — ABNORMAL LOW (ref 12–46)
Lymphs Abs: 1.3 10*3/uL (ref 0.7–4.0)
Monocytes Relative: 6 % (ref 3–12)
Neutro Abs: 19.5 10*3/uL — ABNORMAL HIGH (ref 1.7–7.7)
Neutrophils Relative %: 88 % — ABNORMAL HIGH (ref 43–77)

## 2011-04-26 LAB — CBC
Hemoglobin: 16.4 g/dL — ABNORMAL HIGH (ref 12.0–15.0)
MCHC: 33.8 g/dL (ref 30.0–36.0)
WBC: 22 10*3/uL — ABNORMAL HIGH (ref 4.0–10.5)

## 2011-04-26 LAB — TYPE AND SCREEN: Antibody Screen: NEGATIVE

## 2011-04-26 LAB — URINALYSIS, ROUTINE W REFLEX MICROSCOPIC
Bilirubin Urine: NEGATIVE
Glucose, UA: NEGATIVE mg/dL
Hgb urine dipstick: NEGATIVE
Ketones, ur: NEGATIVE mg/dL
Protein, ur: NEGATIVE mg/dL
pH: 7 (ref 5.0–8.0)

## 2011-04-26 MED ORDER — ONDANSETRON HCL 4 MG/2ML IJ SOLN
4.0000 mg | Freq: Four times a day (QID) | INTRAMUSCULAR | Status: DC | PRN
  Filled 2011-04-26: qty 2

## 2011-04-26 MED ORDER — AMLODIPINE BESYLATE 10 MG PO TABS
10.0000 mg | ORAL_TABLET | Freq: Every day | ORAL | Status: DC
  Administered 2011-04-26 – 2011-04-28 (×3): 10 mg via ORAL
  Filled 2011-04-26 (×3): qty 1

## 2011-04-26 MED ORDER — METRONIDAZOLE IN NACL 5-0.79 MG/ML-% IV SOLN
500.0000 mg | Freq: Three times a day (TID) | INTRAVENOUS | Status: DC
  Administered 2011-04-26 – 2011-04-28 (×6): 500 mg via INTRAVENOUS
  Filled 2011-04-26 (×11): qty 100

## 2011-04-26 MED ORDER — ONDANSETRON HCL 4 MG/2ML IJ SOLN
4.0000 mg | Freq: Once | INTRAMUSCULAR | Status: DC
  Filled 2011-04-26: qty 2

## 2011-04-26 MED ORDER — SODIUM CHLORIDE 0.9 % IV SOLN
INTRAVENOUS | Status: DC
  Administered 2011-04-26 – 2011-04-28 (×3): via INTRAVENOUS

## 2011-04-26 MED ORDER — PROMETHAZINE HCL 25 MG/ML IJ SOLN
12.5000 mg | INTRAMUSCULAR | Status: AC
  Administered 2011-04-26: 12.5 mg via INTRAVENOUS
  Filled 2011-04-26: qty 1

## 2011-04-26 MED ORDER — ONDANSETRON HCL 4 MG PO TABS: 4.0000 mg | ORAL_TABLET | Freq: Four times a day (QID) | ORAL | Status: DC | PRN

## 2011-04-26 MED ORDER — ALBUTEROL SULFATE (5 MG/ML) 0.5% IN NEBU
2.5000 mg | INHALATION_SOLUTION | RESPIRATORY_TRACT | Status: DC
  Administered 2011-04-26 (×2): 2.5 mg via RESPIRATORY_TRACT
  Filled 2011-04-26 (×2): qty 0.5

## 2011-04-26 MED ORDER — ACETAMINOPHEN 650 MG RE SUPP: 650.0000 mg | Freq: Four times a day (QID) | RECTAL | Status: DC | PRN

## 2011-04-26 MED ORDER — PANTOPRAZOLE SODIUM 40 MG IV SOLR
40.0000 mg | INTRAVENOUS | Status: DC
  Administered 2011-04-27: 40 mg via INTRAVENOUS
  Filled 2011-04-26 (×2): qty 40

## 2011-04-26 MED ORDER — ONDANSETRON HCL 4 MG/2ML IJ SOLN
4.0000 mg | Freq: Once | INTRAMUSCULAR | Status: AC
  Administered 2011-04-26: 4 mg via INTRAVENOUS

## 2011-04-26 MED ORDER — ONDANSETRON HCL 4 MG/2ML IJ SOLN: 4.0000 mg | Freq: Four times a day (QID) | INTRAMUSCULAR | Status: DC | PRN

## 2011-04-26 MED ORDER — PANTOPRAZOLE SODIUM 40 MG PO TBEC: 40.0000 mg | DELAYED_RELEASE_TABLET | Freq: Every day | ORAL | Status: DC

## 2011-04-26 MED ORDER — ALBUTEROL SULFATE (5 MG/ML) 0.5% IN NEBU
2.5000 mg | INHALATION_SOLUTION | Freq: Four times a day (QID) | RESPIRATORY_TRACT | Status: DC
  Administered 2011-04-26 – 2011-04-28 (×6): 2.5 mg via RESPIRATORY_TRACT
  Filled 2011-04-26 (×6): qty 0.5

## 2011-04-26 MED ORDER — SODIUM CHLORIDE 0.9 % IV SOLN: 80.0000 mg | Freq: Once | INTRAVENOUS | Status: DC

## 2011-04-26 MED ORDER — CIPROFLOXACIN IN D5W 400 MG/200ML IV SOLN
400.0000 mg | Freq: Two times a day (BID) | INTRAVENOUS | Status: DC
  Administered 2011-04-26 – 2011-04-28 (×4): 400 mg via INTRAVENOUS
  Filled 2011-04-26 (×6): qty 200

## 2011-04-26 MED ORDER — ACETAMINOPHEN 325 MG PO TABS: 650.0000 mg | ORAL_TABLET | Freq: Four times a day (QID) | ORAL | Status: DC | PRN

## 2011-04-26 MED ORDER — IPRATROPIUM BROMIDE 0.02 % IN SOLN
0.5000 mg | RESPIRATORY_TRACT | Status: DC
  Administered 2011-04-26 (×2): 0.5 mg via RESPIRATORY_TRACT
  Filled 2011-04-26 (×2): qty 2.5

## 2011-04-26 MED ORDER — SODIUM CHLORIDE 0.9 % IV SOLN: 8.0000 mg/h | INTRAVENOUS | Status: DC

## 2011-04-26 MED ORDER — HYDROCODONE-ACETAMINOPHEN 5-325 MG PO TABS
1.0000 | ORAL_TABLET | Freq: Four times a day (QID) | ORAL | Status: DC | PRN
  Administered 2011-04-27: 1 via ORAL
  Filled 2011-04-26 (×2): qty 1

## 2011-04-26 MED ORDER — ONDANSETRON HCL 4 MG/2ML IJ SOLN
4.0000 mg | Freq: Four times a day (QID) | INTRAMUSCULAR | Status: DC | PRN
  Administered 2011-04-26: 4 mg via INTRAVENOUS

## 2011-04-26 MED ORDER — ALPRAZOLAM 0.5 MG PO TABS: 0.5000 mg | ORAL_TABLET | Freq: Every evening | ORAL | Status: DC | PRN

## 2011-04-26 MED ORDER — IPRATROPIUM-ALBUTEROL 0.5-2.5 (3) MG/3ML IN SOLN: 3.0000 mL | Freq: Four times a day (QID) | RESPIRATORY_TRACT | Status: DC

## 2011-04-26 MED ORDER — PROMETHAZINE HCL 25 MG/ML IJ SOLN
12.5000 mg | Freq: Once | INTRAMUSCULAR | Status: AC
  Administered 2011-04-26: 12.5 mg via INTRAVENOUS
  Filled 2011-04-26: qty 1

## 2011-04-26 MED ORDER — ONDANSETRON HCL 4 MG/2ML IJ SOLN: 4.0000 mg | Freq: Three times a day (TID) | INTRAMUSCULAR | Status: DC | PRN

## 2011-04-26 MED ORDER — SODIUM CHLORIDE 0.9 % IV BOLUS (SEPSIS)
1000.0000 mL | Freq: Once | INTRAVENOUS | Status: AC
  Administered 2011-04-26: 1000 mL via INTRAVENOUS

## 2011-04-26 MED ORDER — ENALAPRIL MALEATE 10 MG PO TABS
10.0000 mg | ORAL_TABLET | Freq: Every day | ORAL | Status: DC
  Administered 2011-04-26 – 2011-04-28 (×3): 10 mg via ORAL
  Filled 2011-04-26 (×3): qty 1

## 2011-04-26 MED ORDER — FLUTICASONE PROPIONATE HFA 44 MCG/ACT IN AERO
1.0000 | INHALATION_SPRAY | Freq: Two times a day (BID) | RESPIRATORY_TRACT | Status: DC
  Administered 2011-04-26 – 2011-04-27 (×3): 1 via RESPIRATORY_TRACT
  Filled 2011-04-26: qty 10.6

## 2011-04-26 MED ORDER — HYDROMORPHONE HCL PF 1 MG/ML IJ SOLN
0.5000 mg | INTRAMUSCULAR | Status: DC | PRN
  Administered 2011-04-27: 0.5 mg via INTRAVENOUS
  Filled 2011-04-26: qty 1

## 2011-04-26 MED ORDER — PANTOPRAZOLE SODIUM 40 MG IV SOLR
40.0000 mg | Freq: Once | INTRAVENOUS | Status: AC
  Administered 2011-04-26: 40 mg via INTRAVENOUS

## 2011-04-26 MED ORDER — IPRATROPIUM BROMIDE 0.02 % IN SOLN
0.5000 mg | Freq: Four times a day (QID) | RESPIRATORY_TRACT | Status: DC
  Administered 2011-04-26 – 2011-04-28 (×6): 0.5 mg via RESPIRATORY_TRACT
  Filled 2011-04-26 (×6): qty 2.5

## 2011-04-26 MED ORDER — SODIUM CHLORIDE 0.9 % IV SOLN
8.0000 mg/h | INTRAVENOUS | Status: AC
  Administered 2011-04-26: 8 mg/h via INTRAVENOUS
  Filled 2011-04-26: qty 80

## 2011-04-26 MED ORDER — PANTOPRAZOLE SODIUM 40 MG IV SOLR
40.0000 mg | Freq: Once | INTRAVENOUS | Status: DC
  Administered 2011-04-26: 40 mg via INTRAVENOUS
  Filled 2011-04-26: qty 40

## 2011-04-26 MED ORDER — PANTOPRAZOLE SODIUM 40 MG IV SOLR
INTRAVENOUS | Status: AC
  Filled 2011-04-26: qty 40

## 2011-04-26 MED ORDER — MORPHINE SULFATE 2 MG/ML IJ SOLN
2.0000 mg | INTRAMUSCULAR | Status: DC | PRN
  Administered 2011-04-26 (×2): 2 mg via INTRAVENOUS
  Filled 2011-04-26 (×2): qty 1

## 2011-04-26 MED ORDER — IOHEXOL 300 MG/ML  SOLN
80.0000 mL | Freq: Once | INTRAMUSCULAR | Status: AC | PRN
  Administered 2011-04-26: 80 mL via INTRAVENOUS

## 2011-04-26 NOTE — Progress Notes (Signed)
Pt transferred to 5505 from ED. Placed on telemetry running normal sinus rhythm. Vitals taken. Given call bell and explained how to call.

## 2011-04-26 NOTE — H&P (Signed)
[name] [name] MRN: [id number] DOB/AGE: 07/14/1935 75 y.o. Primary Care Physician:MCKEOWN,WILLIAM DAVID, MD, MD Admit date: 04/26/2011 Primary care provider is Dr. Vernie Shanks Chief Complaint: Abdominal pain and diarrhea HPI: 75 year old woman with past medical history significant for COPD, hiatal hernia, GERD, gastritis and diverticula who underwent recent polypectomy on November 28 who presents with nausea, vomiting, and bloody diarrhea x2 days. She states that she is at about 10-15 bowel movements. Her symptoms started yesterday after she ate at a restaurant. She has also experienced bronchitis recently and was prescribed Z-Pak. But she has not started this. [name] [name] states that after lunch on Monday she started to have nausea vomiting and diarrhea. The nausea and vomiting or severe leading to vomiting approximately every 15-30 minutes. The vomiting was initially contents and became clear then transitioned today to brown liquid. She states that this morning approximately 2 AM she started having increased cramping and her diarrhea became bloody. This was bright red blood per rectum. Her cramping pain is moderate without radiation. It is located in the bilateral lower quadrants. It is relieved with Zofran and decreased vomiting. [name] [name] denies any sick contacts or undercooked foods. She is currently scheduled to have hiatal hernia repair on January 25. She recently underwent screening colonoscopy on November 28 when 2 polyps were removed. She's not currently on any anticoagulants such as aspirin, clopidogrel, warfarin. She also endorses a history of vomiting leading to GI bleed in the early 90s. She at that time she was told she had a "fissure." Associated symptoms include shortness of breath, generalized weakness, lightheadedness , and feeling orthostatic. Pertinent negatives include chest pain or tightness, headache or palpitations.   Past Medical History  Diagnosis Date  . CAD (coronary artery  disease)     LHC 2003 with 40% pLAD, 30% mLAD, 100% D1 (moderate vessel), 100%  D2 (small vessel), 95% mid small OM2.  Pt had PTCA to D1;  D2 and OM2 small vessels and tx medically;  Last Myoview (5/09):  EF 66%, anteroseptal/apical infarct with minimal peri-infarct ischemia.  No significant change from 11/06. Pt managed medically  . Recurrent aspiration bronchitis/pneumonia     PFTs 12/09 FVC 97%, FEV1 101%, ratio 77%, TLC 109%. No significant obstruction or restriction.   . Hypertension   . Hyperlipidemia   . Esophageal stricture   . Diverticulosis of colon   . Gastroesophageal reflux disease with hiatal hernia   . Iron deficiency anemia   . Gastritis   . GERD (gastroesophageal reflux disease)   . Hiatal hernia   . Asthma   . Blood transfusion   . Cough   . Wheezing   . Weakness   . Trouble swallowing   . Abdominal pain   . Hiatal hernia     Past Surgical History  Procedure Date  . Back surgery   . Cholecystectomy   . Bladder repair   . Hemorrhoid surgery   . Cholecystectomy     Patient unsure of date.  [name] Appendectomy     patient unsure of date  . Total abdominal hysterectomy 1975    partial    Prior to Admission medications   Medication Sig Start Date End Date Taking? Authorizing Provider  ALPRAZolam Prudy Feeler) 0.5 MG tablet Take 0.5 mg by mouth at bedtime as needed.     Yes Historical Provider, MD  amLODipine (NORVASC) 10 MG tablet Take 10 mg by mouth daily.    Yes Historical Provider, MD  aspirin 325 MG tablet Take 325 mg by  mouth daily.     Yes Historical Provider, MD  calcium carbonate (TUMS - DOSED IN MG ELEMENTAL CALCIUM) 500 MG chewable tablet Chew 1 tablet by mouth daily.     Yes Historical Provider, MD  Cholecalciferol (VITAMIN D) 1000 UNITS capsule Take 6,000 Units by mouth daily.    Yes Historical Provider, MD  Cyanocobalamin (VITAMIN B 12 PO) Take by mouth.     Yes Historical Provider, MD  Dextromethorphan-Guaifenesin [facility] DM PO) Take by mouth as needed.  For congestion or cough   Yes Historical Provider, MD  enalapril (VASOTEC) 20 MG tablet Take 1 tablet by mouth Daily.   11/14/10  Yes Historical Provider, MD  hydrochlorothiazide 25 MG tablet Take 1 tablet by mouth Daily. 09/11/10  Yes Historical Provider, MD  HYDROcodone-acetaminophen (VICODIN) 5-500 MG per tablet Take 1 tablet by mouth every 4 (four) hours as needed. As needed for pain 09/19/10  Yes Historical Provider, MD  ipratropium-albuterol (DUONEB) 0.5-2.5 (3) MG/3ML SOLN Take 3 mLs by nebulization 4 (four) times daily. 10/12/10  Yes Beatrice Lecher, PA  pantoprazole (PROTONIX) 40 MG tablet 40 mg daily.  03/06/11  Yes Historical Provider, MD  pravastatin (PRAVACHOL) 40 MG tablet Take 40 mg by mouth daily.     Yes Historical Provider, MD  QVAR 80 MCG/ACT inhaler 1 puff as needed.  02/05/11  Yes Historical Provider, MD  azithromycin (ZITHROMAX) 250 MG tablet Has no started 03/19/11   Historical Provider, MD    Allergies:  Allergies  Allergen Reactions  . Aspartame Diarrhea and Nausea And Vomiting    Family History  Problem Relation Age of Onset  . Cancer Mother     colon   . Colon cancer Mother 56  . Heart disease Father 1    heart attack    Social History:  reports that she has never smoked. She has never used smokeless tobacco. She reports that she does not drink alcohol or use illicit drugs.  Family history reviewed and found to be negative       ROS: A complete 14 point review of systems was done as documented in history of present illness  PHYSICAL EXAM: Blood pressure 147/76, pulse 94, temperature 98.7 F (37.1 C), temperature source Oral, resp. rate 18, height 5\' 5"  (1.651 m), weight 73.074 kg (161 lb 1.6 oz), SpO2 94.00%.  Nursing note and vitals reviewed.  Constitutional: She is oriented to person, place, and time. She appears well-developed and well-nourished. She appears ill. No distress.  HENT:  Head: Normocephalic and atraumatic.  Mouth/Throat: Oropharynx is  clear and moist. No oropharyngeal exudate.  Eyes: Pupils are equal, round, and reactive to light. No scleral icterus.  Neck: Neck supple.  Cardiovascular: Regular rhythm, normal heart sounds, intact distal pulses and normal pulses. Occasional extrasystoles are present. Tachycardia present. Exam reveals no gallop and no friction rub.  No murmur heard.  Pulmonary/Chest: Effort normal. No accessory muscle usage. Not tachypneic. No respiratory distress. She has wheezes. She has rhonchi.  Diffuse wheezes and rhonchi throughout lung fields.  Abdominal: Soft. Normal appearance. She exhibits no distension. Bowel sounds are increased. There is generalized tenderness. There is no rigidity, no rebound, no guarding and no CVA tenderness.  Genitourinary: Rectal exam shows no external hemorrhoid, no internal hemorrhoid, no fissure, no mass, no tenderness and anal tone normal. Guaiac positive stool.  Stool on rectal exam was maroon colored and grossly positive for blood  Musculoskeletal: Normal range of motion. She exhibits no edema.  Neurological: She is  alert and oriented to person, place, and time. No cranial nerve deficit.  Skin: Skin is warm and dry. No rash noted. She is not diaphoretic. No erythema. There is pallor.  Psychiatric: She has a normal mood and affect. Her behavior is normal. Judgment and thought content normal.    No results found for this or any previous visit (from the past 240 hour(s)).   Results for orders placed during the hospital encounter of 04/26/11 (from the past 48 hour(s))  CBC     Status: Abnormal   Collection Time   04/26/11  4:42 AM      Component Value Range Comment   WBC 22.0 (*) 4.0 - 10.5 (K/uL)    RBC 5.49 (*) 3.87 - 5.11 (MIL/uL)    Hemoglobin 16.4 (*) 12.0 - 15.0 (g/dL)    HCT 40.9 (*) 81.1 - 46.0 (%)    MCV 88.3  78.0 - 100.0 (fL)    MCH 29.9  26.0 - 34.0 (pg)    MCHC 33.8  30.0 - 36.0 (g/dL)    RDW 91.4  78.2 - 95.6 (%)    Platelets 415 (*) 150 - 400 (K/uL)    DIFFERENTIAL     Status: Abnormal   Collection Time   04/26/11  4:42 AM      Component Value Range Comment   Neutrophils Relative 88 (*) 43 - 77 (%)    Neutro Abs 19.5 (*) 1.7 - 7.7 (K/uL)    Lymphocytes Relative 6 (*) 12 - 46 (%)    Lymphs Abs 1.3  0.7 - 4.0 (K/uL)    Monocytes Relative 6  3 - 12 (%)    Monocytes Absolute 1.3 (*) 0.1 - 1.0 (K/uL)    Eosinophils Relative 0  0 - 5 (%)    Eosinophils Absolute 0.0  0.0 - 0.7 (K/uL)    Basophils Relative 0  0 - 1 (%)    Basophils Absolute 0.0  0.0 - 0.1 (K/uL)   COMPREHENSIVE METABOLIC PANEL     Status: Abnormal   Collection Time   04/26/11  4:42 AM      Component Value Range Comment   Sodium 138  135 - 145 (mEq/L)    Potassium 3.7  3.5 - 5.1 (mEq/L)    Chloride 99  96 - 112 (mEq/L)    CO2 23  19 - 32 (mEq/L)    Glucose, Bld 159 (*) 70 - 99 (mg/dL)    BUN 22  6 - 23 (mg/dL)    Creatinine, Ser 2.13  0.50 - 1.10 (mg/dL)    Calcium 08.6  8.4 - 10.5 (mg/dL)    Total Protein 8.0  6.0 - 8.3 (g/dL)    Albumin 4.5  3.5 - 5.2 (g/dL)    AST 18  0 - 37 (U/L)    ALT 14  0 - 35 (U/L)    Alkaline Phosphatase 71  39 - 117 (U/L)    Total Bilirubin 0.8  0.3 - 1.2 (mg/dL)    GFR calc non Af Amer 56 (*) >90 (mL/min)    GFR calc Af Amer 65 (*) >90 (mL/min)   PROTIME-INR     Status: Normal   Collection Time   04/26/11  4:42 AM      Component Value Range Comment   Prothrombin Time 11.7  11.6 - 15.2 (seconds)    INR 0.84  0.00 - 1.49    TYPE AND SCREEN     Status: Normal   Collection Time   04/26/11  4:50 AM      Component Value Range Comment   ABO/RH(D) O POS      Antibody Screen NEG      Sample Expiration 04/29/2011     OCCULT BLOOD, POC DEVICE     Status: Normal   Collection Time   04/26/11  8:33 AM      Component Value Range Comment   Fecal Occult Bld POSITIVE     URINALYSIS, ROUTINE W REFLEX MICROSCOPIC     Status: Normal   Collection Time   04/26/11  9:11 AM      Component Value Range Comment   Color, Urine YELLOW  YELLOW      APPearance CLEAR  CLEAR     Specific Gravity, Urine 1.023  1.005 - 1.030     pH 7.0  5.0 - 8.0     Glucose, UA NEGATIVE  NEGATIVE (mg/dL)    Hgb urine dipstick NEGATIVE  NEGATIVE     Bilirubin Urine NEGATIVE  NEGATIVE     Ketones, ur NEGATIVE  NEGATIVE (mg/dL)    Protein, ur NEGATIVE  NEGATIVE (mg/dL)    Urobilinogen, UA 0.2  0.0 - 1.0 (mg/dL)    Nitrite NEGATIVE  NEGATIVE     Leukocytes, UA NEGATIVE  NEGATIVE  MICROSCOPIC NOT DONE ON URINES WITH NEGATIVE PROTEIN, BLOOD, LEUKOCYTES, NITRITE, OR GLUCOSE <1000 mg/dL.    Ct Abdomen Pelvis W Contrast  04/26/2011  *RADIOLOGY REPORT*  Clinical Data: GI bleed after colonoscopy.  History of diverticulosis.  Diarrhea, vomiting.  Abdominal pain.  CT ABDOMEN AND PELVIS WITH CONTRAST  Technique:  Multidetector CT imaging of the abdomen and pelvis was performed following the standard protocol during bolus administration of intravenous contrast.  Contrast: 80mL OMNIPAQUE IOHEXOL 300 MG/ML IV SOLN  Comparison: 03/01/2011  Findings: Large hiatal hernia present.  Heart is normal size. Scarring in the lung bases.  No effusions.  There is wall thickening and surrounding inflammatory change involving the colon, beginning in the distal transverse colon and extending through the descending colon.  Findings are compatible with colitis.  No free air, free fluid or adenopathy.  Remainder of the colon and the small bowel are unremarkable.  Prior cholecystectomy.  Liver, spleen, pancreas, adrenals and kidneys are normal.  Prior hysterectomy.  No adnexal masses.  Urinary bladder is unremarkable.  No acute bony abnormality.  Degenerative changes in the lumbar spine.  The aorta is normal caliber with moderate atherosclerotic calcifications.  Mesenteric vessels appear patent.  IMPRESSION: Evidence of colitis involving the distal transverse colon, splenic flexure and descending colon, presumably infectious or inflammatory.  Mesenteric vessels appear patent.  Prior cholecystectomy.   Large hiatal hernia.  Original Report Authenticated By: Cyndie Chime, M.D.   [name] Kayleen Memos W/high Density Hans Eden  04/08/2011  *RADIOLOGY REPORT*  Clinical Data:  Hiatal hernia, preop.  UPPER GI SERIES WITH KUB  Technique:  Routine upper GI series was performed with thin and high density barium.  Fluoroscopy Time: 1.3 minutes  Comparison:  CT abdomen pelvis 03/01/2011.  Findings: Scout view of the abdomen shows a normal bowel gas pattern.  Surgical clips are seen in the right upper quadrant.  Double contrast examination of the upper gastrointestinal tract shows poor primary esophageal peristalsis.  During real-time examination, there is no esophageal fold thickening.  No stricture or obstruction.  Large hiatal hernia.  The stomach and duodenal bulb are otherwise unremarkable.  IMPRESSION:  1.  Large hiatal hernia. 2.  Decreased esophageal motility.  Original Report Authenticated By:  Reyes Ivan, M.D.    Impression:  Principal Problem: #1 colitis this is most likely an infectious process, and doubt C. difficile colitis since the patient has not been on any recent antibiotics. Will order stool culture, C. difficile PCR. Continue with IV ciprofloxacin and IV Flagyl that was initiated in the ED. She describes lower GI bleeding, and was guaiac positive in the ED. We'll monitor CBC closely.    #2 HYPERLIPIDEMIA #3 HYPERTENSION continue Norvasc and Vasotec, hold HCTZ #4 CAD, UNSPECIFIED SITE #5 ASTHMA continue fluticasone Atrovent and albuterol  #6 ESOPHAGEAL STRICTURE continue Protonix, she did have some dark-colored vomitus, but I doubt that this is active ongoing bleeding.  #7 DIVERTICULOSIS, COLON  #8 IRON DEFICIENCY ANEMIA, monitor CBC closely           [name] [name] 04/26/2011, 7:01 PM

## 2011-04-26 NOTE — ED Notes (Signed)
Pt given CT contrast to drink

## 2011-04-26 NOTE — ED Notes (Signed)
In and out cath per RN for urine sample. Procedure explained to patient, sterile technique used. Clear yellow urine returned. Patient tolerated well.

## 2011-04-26 NOTE — ED Notes (Signed)
PT. REPORTS VOMITTING AND BLOODY DIARRHEA  ONSET YESTERDAY , DENIES FEVER OR CHILLS , GENERALIZED ABDOMINAL PAIN .

## 2011-04-26 NOTE — ED Notes (Signed)
PT to ED c/o acute onset  Nausea and vomiting since yesterday at 1300.  She continued vomiting and having diarrhea all night and into the am.  She decided to come to the ED when she began having bright red blood in her stool ( 2 1/2  Weeks prior she had 2 polyps in her colon cauterized).    BS hypoactive in all 4 quadrants.  Pt with dry heaves and tachycardic.

## 2011-04-26 NOTE — ED Provider Notes (Signed)
History     CSN: [id number] Arrival date & time: 04/26/2011  4:23 AM   First MD Initiated Contact with Patient 04/26/11 [phone]      Chief Complaint  Patient presents with  . Diarrhea    (Consider location/radiation/quality/duration/timing/severity/associated sxs/prior treatment) HPI [name] [name] is a 75 year old woman with past medical history significant for COPD, hiatal hernia, GERD, gastritis and diverticula who underwent recent polypectomy on November 28 who presents with nausea, vomiting, and bloody diarrhea x2 days.  [name] [name] states that after lunch on Monday she started to have nausea vomiting and diarrhea. The nausea and vomiting or severe leading to vomiting approximately every 15-30 minutes. The vomiting was initially contents and became clear then transitioned today to brown liquid. She states that this morning approximately 2 AM she started having increased cramping and her diarrhea became bloody. This was bright red blood per rectum. Her cramping pain is moderate without radiation. It is located in the bilateral lower quadrants. It is relieved with Zofran and decreased vomiting. [name] [name] denies any sick contacts or undercooked foods. She is currently scheduled to have hiatal hernia repair on January 25. She recently underwent screening colonoscopy on November 28 when 2 polyps were removed. She's not currently on any anticoagulants such as aspirin, clopidogrel, warfarin. She also endorses a history of vomiting leading to GI bleed in the early 90s. She at that time she was told she had a "fissure." Associated symptoms include shortness of breath, generalized weakness, lightheadedness , and feeling orthostatic. Pertinent negatives include chest pain or tightness, headache or palpitations.      Past Medical History  Diagnosis Date  . CAD (coronary artery disease)     LHC 2003 with 40% pLAD, 30% mLAD, 100% D1 (moderate vessel), 100%  D2 (small vessel), 95% mid small OM2.  Pt had PTCA to  D1;  D2 and OM2 small vessels and tx medically;  Last Myoview (5/09):  EF 66%, anteroseptal/apical infarct with minimal peri-infarct ischemia.  No significant change from 11/06. Pt managed medically  . Recurrent aspiration bronchitis/pneumonia     PFTs 12/09 FVC 97%, FEV1 101%, ratio 77%, TLC 109%. No significant obstruction or restriction.   . Hypertension   . Hyperlipidemia   . Esophageal stricture   . Diverticulosis of colon   . Gastroesophageal reflux disease with hiatal hernia   . Iron deficiency anemia   . Gastritis   . GERD (gastroesophageal reflux disease)   . Hiatal hernia   . Asthma   . Blood transfusion   . Cough   . Wheezing   . Weakness   . Trouble swallowing   . Abdominal pain   . Hiatal hernia     Past Surgical History  Procedure Date  . Back surgery   . Cholecystectomy   . Bladder repair   . Hemorrhoid surgery   . Cholecystectomy     Patient unsure of date.  [name] [name] Appendectomy     patient unsure of date  . Total abdominal hysterectomy 1975    partial    Family History  Problem Relation Age of Onset  . Cancer Mother     colon   . Colon cancer Mother 19  . Heart disease Father 82    heart attack    History  Substance Use Topics  . Smoking status: Never Smoker   . Smokeless tobacco: Never Used  . Alcohol Use: No    OB History    Grav Para Term Preterm Abortions TAB SAB Ect Mult  Living                  Review of Systems  Constitutional: Negative for fever, chills and diaphoresis.  HENT: Negative for nosebleeds and sore throat.   Eyes: Negative for photophobia and visual disturbance.  Respiratory: Positive for shortness of breath and wheezing. Negative for cough and chest tightness.   Cardiovascular: Negative for chest pain, palpitations and leg swelling.  Gastrointestinal: Positive for nausea, vomiting, abdominal pain, diarrhea, blood in stool and anal bleeding. Negative for constipation, abdominal distention and rectal pain.  Genitourinary:  Negative for dysuria, hematuria and difficulty urinating.  Musculoskeletal: Positive for back pain.  Skin: Positive for pallor. Negative for rash.  Neurological: Positive for dizziness, weakness and light-headedness. Negative for syncope, numbness and headaches.    Allergies  Aspartame  Home Medications   Current Outpatient Rx  Name Route Sig Dispense Refill  . ALPRAZOLAM 0.5 MG PO TABS Oral Take 0.5 mg by mouth at bedtime as needed.      [name] [name] AMLODIPINE BESYLATE 10 MG PO TABS Oral Take 10 mg by mouth daily.     . ASPIRIN 325 MG PO TABS Oral Take 325 mg by mouth daily.      [name] [name] CALCIUM CARBONATE ANTACID 500 MG PO CHEW Oral Chew 1 tablet by mouth daily.      [name] [name] VITAMIN D 1000 UNITS PO CAPS Oral Take 6,000 Units by mouth daily.     [name] [name] VITAMIN B 12 PO Oral Take by mouth.      [name] [name] DM PO Oral Take by mouth as needed. For congestion or cough    . ENALAPRIL MALEATE 20 MG PO TABS Oral Take 1 tablet by mouth Daily.      [name] [name] HYDROCHLOROTHIAZIDE 25 MG PO TABS Oral Take 1 tablet by mouth Daily.    [name] [name] HYDROCODONE-ACETAMINOPHEN 5-500 MG PO TABS Oral Take 1 tablet by mouth every 4 (four) hours as needed. As needed for pain    . IPRATROPIUM-ALBUTEROL 0.5-2.5 (3) MG/3ML IN SOLN Nebulization Take 3 mLs by nebulization 4 (four) times daily. 360 mL   . PANTOPRAZOLE SODIUM 40 MG PO TBEC  40 mg daily.     [name] [name] PRAVASTATIN SODIUM 40 MG PO TABS Oral Take 40 mg by mouth daily.      [name] [name] QVAR 80 MCG/ACT IN AERS  1 puff as needed.     . AZITHROMYCIN 250 MG PO TABS  Has no started      BP 132/68  Pulse 107  Temp(Src) 98.6 F (37 C) (Oral)  Resp 20  SpO2 98%  Physical Exam  Nursing note and vitals reviewed. Constitutional: She is oriented to person, place, and time. She appears well-developed and well-nourished. She appears ill. No distress.  HENT:  Head: Normocephalic and atraumatic.  Mouth/Throat: Oropharynx is clear and moist. No oropharyngeal exudate.  Eyes: Pupils are equal, round, and reactive to light. No  scleral icterus.  Neck: Neck supple.  Cardiovascular: Regular rhythm, normal heart sounds, intact distal pulses and normal pulses.   Occasional extrasystoles are present. Tachycardia present.  Exam reveals no gallop and no friction rub.   No murmur heard. Pulmonary/Chest: Effort normal. No accessory muscle usage. Not tachypneic. No respiratory distress. She has wheezes. She has rhonchi.       Diffuse wheezes and rhonchi throughout lung fields.  Abdominal: Soft. Normal appearance. She exhibits no distension. Bowel sounds are increased. There is generalized tenderness. There is no rigidity, no rebound, no guarding and no CVA  tenderness.  Genitourinary: Rectal exam shows no external hemorrhoid, no internal hemorrhoid, no fissure, no mass, no tenderness and anal tone normal. Guaiac positive stool.       Stool on rectal exam was maroon colored and grossly positive for blood  Musculoskeletal: Normal range of motion. She exhibits no edema.  Neurological: She is alert and oriented to person, place, and time. No cranial nerve deficit.  Skin: Skin is warm and dry. No rash noted. She is not diaphoretic. No erythema. There is pallor.  Psychiatric: She has a normal mood and affect. Her behavior is normal. Judgment and thought content normal.    ED Course  Procedures (including critical care time)  Labs Reviewed  CBC - Abnormal; Notable for the following:    WBC 22.0 (*)    RBC 5.49 (*)    Hemoglobin 16.4 (*)    HCT 48.5 (*)    Platelets 415 (*)    All other components within normal limits  DIFFERENTIAL - Abnormal; Notable for the following:    Neutrophils Relative 88 (*)    Neutro Abs 19.5 (*)    Lymphocytes Relative 6 (*)    Monocytes Absolute 1.3 (*)    All other components within normal limits  COMPREHENSIVE METABOLIC PANEL - Abnormal; Notable for the following:    Glucose, Bld 159 (*)    GFR calc non Af Amer 56 (*)    GFR calc Af Amer 65 (*)    All other components within normal limits    URINALYSIS, ROUTINE W REFLEX MICROSCOPIC  PROTIME-INR  TYPE AND SCREEN  OCCULT BLOOD, POC DEVICE  OCCULT BLOOD X 1 CARD TO LAB, STOOL  OCCULT BLOOD GASTRIC / DUODENUM  CLOSTRIDIUM DIFFICILE BY PCR   Ct Abdomen Pelvis W Contrast  04/26/2011  *RADIOLOGY REPORT*  Clinical Data: GI bleed after colonoscopy.  History of diverticulosis.  Diarrhea, vomiting.  Abdominal pain.  CT ABDOMEN AND PELVIS WITH CONTRAST  Technique:  Multidetector CT imaging of the abdomen and pelvis was performed following the standard protocol during bolus administration of intravenous contrast.  Contrast: 80mL OMNIPAQUE IOHEXOL 300 MG/ML IV SOLN  Comparison: 03/01/2011  Findings: Large hiatal hernia present.  Heart is normal size. Scarring in the lung bases.  No effusions.  There is wall thickening and surrounding inflammatory change involving the colon, beginning in the distal transverse colon and extending through the descending colon.  Findings are compatible with colitis.  No free air, free fluid or adenopathy.  Remainder of the colon and the small bowel are unremarkable.  Prior cholecystectomy.  Liver, spleen, pancreas, adrenals and kidneys are normal.  Prior hysterectomy.  No adnexal masses.  Urinary bladder is unremarkable.  No acute bony abnormality.  Degenerative changes in the lumbar spine.  The aorta is normal caliber with moderate atherosclerotic calcifications.  Mesenteric vessels appear patent.  IMPRESSION: Evidence of colitis involving the distal transverse colon, splenic flexure and descending colon, presumably infectious or inflammatory.  Mesenteric vessels appear patent.  Prior cholecystectomy.  Large hiatal hernia.  Original Report Authenticated By: Cyndie Chime, M.D.     No diagnosis found.  Date: 04/26/2011  Rate: 106  Rhythm: normal sinus rhythm  QRS Axis: normal  Intervals: normal  ST/T Wave abnormalities: nonspecific T wave changes and in inferior leads  Conduction Disutrbances:none  Narrative  Interpretation: Sinus tachycardia with nonspecific T wave changes in the inferior leads.  Old EKG Reviewed: changes noted     MDM  Ms. Ludvigsen likely has volume loss secondary  to vomiting and diarrhea exacerbated by GI bleed. Volume loss is approximately 20% given orthostasis. Unlclear source at this time.  Hb shows hemoconcentration at this time.  We will ensure she has adequate IV access, volume resuscitate with 2 L normal saline, start pantoprazole drip and control nausea. We'll get CT with contrast to evaluate for abdominal pathology such as perforation or diverticular abscess.  10:43 AM: Patient reevaluated. Nausea and vomiting resolved. Having increased abdominal pain and we'll treat with morphine 2 mg IV every 2 H. heart rate 1:15 blood pressure 150s over 70s status post 2 L normal saline  2:20 PM Pt feeling less lightheaded, but with some residual nausea.  HR in the 100s and BPs 130s/60s.  CT scan showed colitis. Will check c diff, start cipro+flagyl, and [facility].        Quentin Ore, MD 04/26/11 [phone]

## 2011-04-26 NOTE — ED Notes (Signed)
Pt states decreased nausea after zofran.  Pt fell to sleep and sats dropped to upper 80's.  Placed on 2L.  Per pt, she was given a script for a Z-pac and prednisone yesterday, but she has not been able to fill it d/t being sick.  Lung sounds with wheezes bil (per pt, she takes 4 nebx per day for copd).

## 2011-04-26 NOTE — ED Provider Notes (Signed)
1:58 PM  I performed a history and physical examination of [name] [name] and discussed her management with Dr. Candy Sledge.  I agree with the history, physical, assessment, and plan of care, with the following exceptions: None  The patient presents for evaluation of nausea, vomiting, and diarrhea which has yielded bloody stool, accompanied by diffuse but mostly left lower cautery and abdominal pain. She had a colonoscopy with polypectomy in the recent past couple of weeks. She appears to have colitis. We are starting IV antibiotics to treat this. The patient has normal bowel sounds and has tenderness to palpation over the left lower quadrant. She has no guarding or rebound tenderness. Her lung sounds are clear to auscultation with normal heart sounds but mild tachycardia.  I was present for the following procedures: None Time Spent in Critical Care of the patient: None Time spent in discussions with the patient and family: 5 minutes  Rhegan Trunnell D    Felisa Bonier, MD 04/26/11 1401

## 2011-04-27 LAB — COMPREHENSIVE METABOLIC PANEL
Alkaline Phosphatase: 51 U/L (ref 39–117)
BUN: 9 mg/dL (ref 6–23)
Chloride: 106 mEq/L (ref 96–112)
Creatinine, Ser: 0.84 mg/dL (ref 0.50–1.10)
GFR calc Af Amer: 78 mL/min — ABNORMAL LOW (ref >90)
GFR calc non Af Amer: 67 mL/min — ABNORMAL LOW (ref >90)
Glucose, Bld: 91 mg/dL (ref 70–99)
Potassium: 4.2 mEq/L (ref 3.5–5.1)
Total Bilirubin: 0.8 mg/dL (ref 0.3–1.2)

## 2011-04-27 LAB — CARDIAC PANEL(CRET KIN+CKTOT+MB+TROPI)
CK, MB: 2.2 ng/mL (ref 0.3–4.0)
Relative Index: INVALID (ref 0.0–2.5)
Relative Index: INVALID (ref 0.0–2.5)
Total CK: 63 U/L (ref 7–177)
Troponin I: <0.3 ng/mL (ref <0.30)

## 2011-04-27 LAB — CBC
HCT: 41.3 % (ref 36.0–46.0)
Hemoglobin: 13.6 g/dL (ref 12.0–15.0)
Hemoglobin: 13.6 g/dL (ref 12.0–15.0)
MCHC: 32.9 g/dL (ref 30.0–36.0)
MCV: 91.2 fL (ref 78.0–100.0)
Platelets: 328 10*3/uL (ref 150–400)
RBC: 4.6 MIL/uL (ref 3.87–5.11)
RDW: 14.8 % (ref 11.5–15.5)
WBC: 14.7 10*3/uL — ABNORMAL HIGH (ref 4.0–10.5)

## 2011-04-27 LAB — CLOSTRIDIUM DIFFICILE BY PCR: Toxigenic C. Difficile by PCR: NEGATIVE

## 2011-04-27 NOTE — Progress Notes (Signed)
Subjective: Chart reviewed. Patient indicates that on Monday she ate at some seafood at [employer]. An hour later she started having nausea vomiting abdominal pain and diarrhea. Since admission she says she's feeling 50% better with no further nausea and vomiting. She is tolerating clear liquids. Her abdominal pain is about 5-6/10 in the lower quadrants. She had one small bloody diarrhea this morning. She recently had a colonoscopy on November 28 by Dr. Melvia Heaps which apparently showed 2 polyps which were removed.  Objective: Blood pressure 120/69, pulse 77, temperature 98.3 F (36.8 C), temperature source Oral, resp. rate 19, height 5\' 5"  (1.651 m), weight 73.074 kg (161 lb 1.6 oz), SpO2 98.00%.  Intake/Output Summary (Last 24 hours) at 04/27/11 1324 Last data filed at 04/27/11 0900  Gross per 24 hour  Intake 2062.91 ml  Output      1 ml  Net 2061.91 ml   General exam: Comfortable. Respiratory system: Clear Cardiovascular system: First and second heart sounds heard, regular. Gastrointestinal system: Abdomen is nondistended, soft, nontender and normal bowel sounds heard. Central nervous system: Alert and oriented. No focal neurological deficits.  Lab Results: Basic Metabolic Panel:  Basename 04/27/11 0825 04/26/11 2040 04/26/11 0442  NA 141 -- 138  K 4.2 -- 3.7  CL 106 -- 99  CO2 25 -- 23  GLUCOSE 91 -- 159*  BUN 9 -- 22  CREATININE 0.84 -- 0.98  CALCIUM 8.6 -- 10.5  MG -- 1.7 --  PHOS -- 2.5 --   Liver Function Tests:  Basename 04/27/11 0825 04/26/11 0442  AST 16 18  ALT 10 14  ALKPHOS 51 71  BILITOT 0.8 0.8  PROT 6.0 8.0  ALBUMIN 3.1* 4.5   No results found for this basename: LIPASE:2,AMYLASE:2 in the last 72 hours No results found for this basename: AMMONIA:2 in the last 72 hours CBC:  Basename 04/27/11 0825 04/26/11 2325 04/26/11 0442  WBC 14.7* 18.0* --  NEUTROABS -- -- 19.5*  HGB 13.6 13.6 --  HCT 41.3 41.4 --  MCV 91.2 90.0 --  PLT 300 328 --    Cardiac Enzymes:  Basename 04/27/11 0825 04/26/11 2325  CKTOTAL 63 73  CKMB 2.5 2.2  CKMBINDEX -- --  TROPONINI <0.30 <0.30   Coagulation:  Basename 04/26/11 0442  LABPROT 11.7  INR 0.84   Urine Drug Screen: Drugs of Abuse  No results found for this basename: labopia, cocainscrnur, labbenz, amphetmu, thcu, labbarb    Studies/Results: Ct Abdomen Pelvis W Contrast  04/26/2011  *RADIOLOGY REPORT*  Clinical Data: GI bleed after colonoscopy.  History of diverticulosis.  Diarrhea, vomiting.  Abdominal pain.  CT ABDOMEN AND PELVIS WITH CONTRAST   IMPRESSION: Evidence of colitis involving the distal transverse colon, splenic flexure and descending colon, presumably infectious or inflammatory.  Mesenteric vessels appear patent.  Prior cholecystectomy.  Large hiatal hernia.  Original Report Authenticated By: Cyndie Chime, M.D.   [name] Kayleen Memos W/high Density Hans Eden  04/08/2011  *RADIOLOGY REPORT*  Clinical Data:  Hiatal hernia, preop.  UPPER GI SERIES WITH KUB  Technique:  Routine upper GI series was performed with thin and high density barium.  Fluoroscopy Time: 1.3 minutes  Comparison:  CT abdomen pelvis 03/01/2011.  Findings: Scout view of the abdomen shows a normal bowel gas pattern.  Surgical clips are seen in the right upper quadrant.  Double contrast examination of the upper gastrointestinal tract shows poor primary esophageal peristalsis.  During real-time examination, there is no esophageal fold thickening.  No stricture or  obstruction.  Large hiatal hernia.  The stomach and duodenal bulb are otherwise unremarkable.  IMPRESSION:  1.  Large hiatal hernia. 2.  Decreased esophageal motility.  Original Report Authenticated By: Reyes Ivan, M.D.    Medications: Scheduled Meds:   . albuterol  2.5 mg Nebulization QID  . amLODipine  10 mg Oral Daily  . ciprofloxacin  400 mg Intravenous Q12H  . enalapril  10 mg Oral Daily  . fluticasone  1 puff Inhalation BID  . ipratropium  0.5 mg  Nebulization QID  . metronidazole  500 mg Intravenous Q8H  . pantoprazole      . pantoprazole (PROTONIX) IV  40 mg Intravenous Q24H  . promethazine  12.5 mg Intravenous To Major  . DISCONTD: albuterol  2.5 mg Nebulization Q4H  . DISCONTD: ipratropium  0.5 mg Nebulization Q4H  . DISCONTD: ipratropium-albuterol  3 mL Nebulization QID  . DISCONTD: pantoprazole  40 mg Oral Daily   Continuous Infusions:   . sodium chloride 100 mL/hr at 04/27/11 0650   PRN Meds:.acetaminophen, acetaminophen, ALPRAZolam, HYDROcodone-acetaminophen, HYDROmorphone, morphine injection, ondansetron (ZOFRAN) IV, ondansetron, DISCONTD: ondansetron, DISCONTD: ondansetron  Assessment/Plan: 1. Infectious colitis versus less likely ischemic colitis: Clinically improving. Send stool for C. difficile PCR. Continue IV Cipro and Flagyl and oral clears. If patient continues to improve then tomorrow can advance diet and consider discharging home on oral antibiotics. Alerted [facility] of patient's admission. They can be called if there is a decline in her GI condition. 2. Hypertension: Controlled 3. History of coronary artery disease: Stable   Discussed with patient's spouse was at the bedside.   Lonie Newsham 04/27/2011, 1:24 PM

## 2011-04-28 LAB — CBC
Platelets: 324 10*3/uL (ref 150–400)
RBC: 4.38 MIL/uL (ref 3.87–5.11)
RDW: 14.4 % (ref 11.5–15.5)
WBC: 8.8 10*3/uL (ref 4.0–10.5)

## 2011-04-28 LAB — BASIC METABOLIC PANEL
CO2: 24 mEq/L (ref 19–32)
Chloride: 107 mEq/L (ref 96–112)
Creatinine, Ser: 0.87 mg/dL (ref 0.50–1.10)
GFR calc Af Amer: 75 mL/min — ABNORMAL LOW (ref >90)
Potassium: 2.9 mEq/L — ABNORMAL LOW (ref 3.5–5.1)
Sodium: 141 mEq/L (ref 135–145)

## 2011-04-28 MED ORDER — CIPROFLOXACIN HCL 500 MG PO TABS: 500.0000 mg | ORAL_TABLET | Freq: Two times a day (BID) | ORAL | Status: AC

## 2011-04-28 MED ORDER — POTASSIUM CHLORIDE CRYS ER 20 MEQ PO TBCR: 40.0000 meq | EXTENDED_RELEASE_TABLET | Freq: Once | ORAL | Status: DC

## 2011-04-28 MED ORDER — ASPIRIN 325 MG PO TABS: 325.0000 mg | ORAL_TABLET | Freq: Every day | ORAL | Status: DC

## 2011-04-28 MED ORDER — ASPIRIN EC 81 MG PO TBEC: 81.0000 mg | DELAYED_RELEASE_TABLET | Freq: Every day | ORAL | Status: DC

## 2011-04-28 MED ORDER — METRONIDAZOLE 500 MG PO TABS: 500.0000 mg | ORAL_TABLET | Freq: Three times a day (TID) | ORAL | Status: AC

## 2011-04-28 NOTE — Discharge Summary (Signed)
Discharge Summary  [name] [name] MR#: [id number]  DOB:June 18, 1935  Date of Admission: 04/26/2011 Date of Discharge: 04/28/2011  Patient's PCP: [name], MD, MD  Primary gastroenterologist: Melvia Heaps, M.D.  Attending Physician:Finnlee Silvernail  Consults: None  Discharge Diagnoses: 1. Infectious colitis 2. Hypokalemia 3. Hypertension 4. Hyperlipidemia 5. History of coronary artery disease    Brief Admitting History and Physical Patient is a 75 year old female with history of hypertension, hyperlipidemia, COPD, hiatal hernia, gastroesophageal reflux disease, gastritis, CAD, diverticulosis who had a colonoscopy on November 28 which showed a benign polyp which was removed. She was in her usual state of health until the day prior to admission. She had some seafood at a restaurant at about midday. Within an hour she started complaining of nausea, vomiting followed by bloody diarrhea and abdominal pain. She was afebrile and had generalized abdominal tenderness but no rigidity guarding or rebound. She had a leukocytosis of 22,000. She was admitted for further evaluation and management  Discharge Medications Current Discharge Medication List    START taking these medications   Details  ciprofloxacin (CIPRO) 500 MG tablet Take 1 tablet (500 mg total) by mouth 2 (two) times daily. Qty: 10 tablet, Refills: 0    metroNIDAZOLE (FLAGYL) 500 MG tablet Take 1 tablet (500 mg total) by mouth 3 (three) times daily. Qty: 15 tablet, Refills: 0      CONTINUE these medications which have CHANGED   Details  aspirin 325 MG tablet Take 1 tablet (325 mg total) by mouth daily. Do not take for the next 5 days and then can start back if no further bloody stools.      CONTINUE these medications which have NOT CHANGED   Details  ALPRAZolam (XANAX) 0.5 MG tablet Take 0.5 mg by mouth at bedtime as needed.      amLODipine (NORVASC) 10 MG tablet Take 10 mg by mouth daily.     calcium  carbonate (TUMS - DOSED IN MG ELEMENTAL CALCIUM) 500 MG chewable tablet Chew 1 tablet by mouth daily.      Cholecalciferol (VITAMIN D) 1000 UNITS capsule Take 6,000 Units by mouth daily.     Cyanocobalamin (VITAMIN B 12 PO) Take by mouth.      Dextromethorphan-Guaifenesin (MUCINEX DM PO) Take by mouth as needed. For congestion or cough    enalapril (VASOTEC) 20 MG tablet Take 1 tablet by mouth Daily.      hydrochlorothiazide 25 MG tablet Take 1 tablet by mouth Daily.    HYDROcodone-acetaminophen (VICODIN) 5-500 MG per tablet Take 1 tablet by mouth every 4 (four) hours as needed. As needed for pain    ipratropium-albuterol (DUONEB) 0.5-2.5 (3) MG/3ML SOLN Take 3 mLs by nebulization 4 (four) times daily. Qty: 360 mL   Associated Diagnoses: Acute bronchitis    pantoprazole (PROTONIX) 40 MG tablet 40 mg daily.     pravastatin (PRAVACHOL) 40 MG tablet Take 40 mg by mouth daily.      QVAR 80 MCG/ACT inhaler 1 puff as needed.       STOP taking these medications     azithromycin (ZITHROMAX) 250 MG tablet Comments:  Reason for Stopping:          Hospital Course: 1. Infectious colitis: Clinical picture and CT scan of the abdomen were suggestive of infectious colitis, possibly related to the seafood that she consumed. She was admitted. Her bowels were rested. She was started empirically on IV Cipro and Flagyl. She has done quite well. Her nausea and vomiting have resolved and she  is tolerating liquid diet. Her abdominal pain is intermittent mild 4/5 in the lower abdomen. She has had 3 liquid stools today which were brown without any blood or black stools. Patient had recently had a colonoscopy which was unremarkable. Will complete a total one week course of oral antibiotics. We'll reduce her aspirin dose to 81 mg daily for the next few days and then she can return to her original 325 mg by mouth daily, given her recent bloody stools. C. difficile PCR was negative. 2. Hypokalemia: Secondary to  diarrhea. Will repeat prior to discharge and will advance diet as tolerated. 3. Hypertension: Continue home medications. 4. Leukocytosis: Secondary to problem #1. Improved 5. History of coronary artery disease: We'll reduce aspirin dose temporarily.   Day of Discharge BP 126/73  Pulse 90  Temp(Src) 97.9 F (36.6 C) (Oral)  Resp 19  Ht 5\' 5"  (1.651 m)  Wt 73.074 kg (161 lb 1.6 oz)  BMI 26.81 kg/m2  SpO2 92%  General exam: Comfortable.  Respiratory system: Clear  Cardiovascular system: First and second heart sounds heard, regular.  Gastrointestinal system: Abdomen is nondistended, soft, minimal tenderness in the left lower quadrant and suprapubic region on deep palpation but no rigidity guarding or rebound and normal bowel sounds heard.  Central nervous system: Alert and oriented. No focal neurological deficits.   Results for orders placed during the hospital encounter of 04/26/11 (from the past 48 hour(s))  MAGNESIUM     Status: Normal   Collection Time   04/26/11  8:40 PM      Component Value Range Comment   Magnesium 1.7  1.5 - 2.5 (mg/dL)   PHOSPHORUS     Status: Normal   Collection Time   04/26/11  8:40 PM      Component Value Range Comment   Phosphorus 2.5  2.3 - 4.6 (mg/dL)   CARDIAC PANEL(CRET KIN+CKTOT+MB+TROPI)     Status: Normal   Collection Time   04/26/11 11:25 PM      Component Value Range Comment   Total CK 73  7 - 177 (U/L)    CK, MB 2.2  0.3 - 4.0 (ng/mL)    Troponin I <0.30  <0.30 (ng/mL)    Relative Index RELATIVE INDEX IS INVALID  0.0 - 2.5    CBC     Status: Abnormal   Collection Time   04/26/11 11:25 PM      Component Value Range Comment   WBC 18.0 (*) 4.0 - 10.5 (K/uL)    RBC 4.60  3.87 - 5.11 (MIL/uL)    Hemoglobin 13.6  12.0 - 15.0 (g/dL) DELTA CHECK NOTED   HCT 41.4  36.0 - 46.0 (%)    MCV 90.0  78.0 - 100.0 (fL)    MCH 29.6  26.0 - 34.0 (pg)    MCHC 32.9  30.0 - 36.0 (g/dL)    RDW 16.1  09.6 - 04.5 (%)    Platelets 328  150 - 400 (K/uL)    CARDIAC PANEL(CRET KIN+CKTOT+MB+TROPI)     Status: Normal   Collection Time   04/27/11  8:25 AM      Component Value Range Comment   Total CK 63  7 - 177 (U/L)    CK, MB 2.5  0.3 - 4.0 (ng/mL)    Troponin I <0.30  <0.30 (ng/mL)    Relative Index RELATIVE INDEX IS INVALID  0.0 - 2.5    CBC     Status: Abnormal   Collection Time   04/27/11  8:25 AM      Component Value Range Comment   WBC 14.7 (*) 4.0 - 10.5 (K/uL)    RBC 4.53  3.87 - 5.11 (MIL/uL)    Hemoglobin 13.6  12.0 - 15.0 (g/dL)    HCT 16.1  09.6 - 04.5 (%)    MCV 91.2  78.0 - 100.0 (fL)    MCH 30.0  26.0 - 34.0 (pg)    MCHC 32.9  30.0 - 36.0 (g/dL)    RDW 40.9  81.1 - 91.4 (%)    Platelets 300  150 - 400 (K/uL)   COMPREHENSIVE METABOLIC PANEL     Status: Abnormal   Collection Time   04/27/11  8:25 AM      Component Value Range Comment   Sodium 141  135 - 145 (mEq/L)    Potassium 4.2  3.5 - 5.1 (mEq/L)    Chloride 106  96 - 112 (mEq/L)    CO2 25  19 - 32 (mEq/L)    Glucose, Bld 91  70 - 99 (mg/dL)    BUN 9  6 - 23 (mg/dL)    Creatinine, Ser 7.82  0.50 - 1.10 (mg/dL)    Calcium 8.6  8.4 - 10.5 (mg/dL)    Total Protein 6.0  6.0 - 8.3 (g/dL)    Albumin 3.1 (*) 3.5 - 5.2 (g/dL)    AST 16  0 - 37 (U/L) HEMOLYSIS AT THIS LEVEL MAY AFFECT RESULT   ALT 10  0 - 35 (U/L)    Alkaline Phosphatase 51  39 - 117 (U/L)    Total Bilirubin 0.8  0.3 - 1.2 (mg/dL)    GFR calc non Af Amer 67 (*) >90 (mL/min)    GFR calc Af Amer 78 (*) >90 (mL/min)   OCCULT BLOOD X 1 CARD TO LAB, STOOL     Status: Normal   Collection Time   04/27/11  2:38 PM      Component Value Range Comment   Fecal Occult Bld POSITIVE     CLOSTRIDIUM DIFFICILE BY PCR     Status: Normal   Collection Time   04/27/11  2:38 PM      Component Value Range Comment   C difficile by pcr NEGATIVE  NEGATIVE    CBC     Status: Normal   Collection Time   04/28/11  6:20 AM      Component Value Range Comment   WBC 8.8  4.0 - 10.5 (K/uL)    RBC 4.38  3.87 - 5.11 (MIL/uL)     Hemoglobin 13.2  12.0 - 15.0 (g/dL)    HCT 95.6  21.3 - 08.6 (%)    MCV 89.7  78.0 - 100.0 (fL)    MCH 30.1  26.0 - 34.0 (pg)    MCHC 33.6  30.0 - 36.0 (g/dL)    RDW 57.8  46.9 - 62.9 (%)    Platelets 324  150 - 400 (K/uL)   BASIC METABOLIC PANEL     Status: Abnormal   Collection Time   04/28/11  6:20 AM      Component Value Range Comment   Sodium 141  135 - 145 (mEq/L)    Potassium 2.9 (*) 3.5 - 5.1 (mEq/L)    Chloride 107  96 - 112 (mEq/L)    CO2 24  19 - 32 (mEq/L)    Glucose, Bld 103 (*) 70 - 99 (mg/dL)    BUN 6  6 - 23 (mg/dL)    Creatinine, Ser  0.87  0.50 - 1.10 (mg/dL)    Calcium 8.9  8.4 - 10.5 (mg/dL)    GFR calc non Af Amer 65 (*) >90 (mL/min)    GFR calc Af Amer 75 (*) >90 (mL/min)     Ct Abdomen Pelvis W Contrast  04/26/2011  *RADIOLOGY REPORT*  Clinical Data: GI bleed after colonoscopy.  History of diverticulosis.  Diarrhea, vomiting.  Abdominal pain.  CT ABDOMEN AND PELVIS WITH CONTRAST  Technique:  Multidetector CT imaging of the abdomen and pelvis was performed following the standard protocol during bolus administration of intravenous contrast.  Contrast: 80mL OMNIPAQUE IOHEXOL 300 MG/ML IV SOLN  Comparison: 03/01/2011  Findings: Large hiatal hernia present.  Heart is normal size. Scarring in the lung bases.  No effusions.  There is wall thickening and surrounding inflammatory change involving the colon, beginning in the distal transverse colon and extending through the descending colon.  Findings are compatible with colitis.  No free air, free fluid or adenopathy.  Remainder of the colon and the small bowel are unremarkable.  Prior cholecystectomy.  Liver, spleen, pancreas, adrenals and kidneys are normal.  Prior hysterectomy.  No adnexal masses.  Urinary bladder is unremarkable.  No acute bony abnormality.  Degenerative changes in the lumbar spine.  The aorta is normal caliber with moderate atherosclerotic calcifications.  Mesenteric vessels appear patent.  IMPRESSION:  Evidence of colitis involving the distal transverse colon, splenic flexure and descending colon, presumably infectious or inflammatory.  Mesenteric vessels appear patent.  Prior cholecystectomy.  Large hiatal hernia.  Original Report Authenticated By: Cyndie Chime, M.D.   [name] Kayleen Memos W/high Density Hans [place]  04/08/2011  *RADIOLOGY REPORT*  Clinical Data:  Hiatal hernia, preop.  UPPER GI SERIES WITH KUB  Technique:  Routine upper GI series was performed with thin and high density barium.  Fluoroscopy Time: 1.3 minutes  Comparison:  CT abdomen pelvis 03/01/2011.  Findings: Scout view of the abdomen shows a normal bowel gas pattern.  Surgical clips are seen in the right upper quadrant.  Double contrast examination of the upper gastrointestinal tract shows poor primary esophageal peristalsis.  During real-time examination, there is no esophageal fold thickening.  No stricture or obstruction.  Large hiatal hernia.  The stomach and duodenal bulb are otherwise unremarkable.  IMPRESSION:  1.  Large hiatal hernia. 2.  Decreased esophageal motility.  Original Report Authenticated By: Reyes Ivan, M.D.     Disposition: Discharged home in stable condition  Diet: Advance diet gradually  Activity: Trace activity gradually  Follow-up Appts: Discharge Orders    Future Appointments: Provider: Department: Dept Phone: Center:   05/23/2011 9:00 AM Marca Ancona, MD [facility] [phone] [id number]   05/30/2011 3:30 PM Ernestene Mention, MD [facility] [phone] None     Future Orders Please Complete By Expires   Diet - low sodium heart healthy      Increase activity slowly      Discharge instructions      Comments:   Advance diet gradually as tolerated, to a regular diet.   Call MD for:  temperature >100.4      Call MD for:  persistant nausea and vomiting      Call MD for:  severe uncontrolled pain         TESTS THAT NEED FOLLOW-UP None  Time spent on discharge, talking to the patient,  and coordinating care: 30 mins.   SignedMarcellus Scott, MD 04/28/2011, 11:36 AM

## 2011-04-28 NOTE — Progress Notes (Signed)
Patient discharge home.  Discharge instruction reviewed.  Prescription received, No question or concerns verbalized, Skin intact. Patient escorted in wheel chair for discharge

## 2011-04-29 NOTE — ED Provider Notes (Signed)
Evaluation and management procedures were performed by the resident physician under my supervision/collaboration.  I evaluated this patient face-to-face at the time of encounter.  Please see my note dated at that time.   Felisa Bonier, MD 04/29/11 [phone]

## 2011-05-01 LAB — STOOL CULTURE

## 2011-05-04 ENCOUNTER — Encounter (INDEPENDENT_AMBULATORY_CARE_PROVIDER_SITE_OTHER): Payer: Self-pay | Admitting: Internal Medicine

## 2011-05-04 NOTE — Progress Notes (Signed)
Retro ur ins review. 

## 2011-05-23 ENCOUNTER — Encounter (HOSPITAL_COMMUNITY): Payer: Self-pay | Admitting: Pharmacy Technician

## 2011-05-23 ENCOUNTER — Ambulatory Visit: Payer: Medicare Other | Admitting: Cardiology

## 2011-05-24 ENCOUNTER — Telehealth (INDEPENDENT_AMBULATORY_CARE_PROVIDER_SITE_OTHER): Payer: Self-pay

## 2011-05-24 NOTE — Telephone Encounter (Signed)
Pt called to advise Dr Derrell Lolling that she is getting over the flu. She is scheduled for surgery 1-25. Pt states symptoms are resolving and she is feeling much better. Reviewed with Dr Derrell Lolling. Pt advised that as long as she recovers and has no respiratory or gi symptoms at the time of surgery we can proceed with surgery as planned per Dr Derrell Lolling.

## 2011-05-25 ENCOUNTER — Encounter (HOSPITAL_COMMUNITY)
Admission: RE | Admit: 2011-05-25 | Discharge: 2011-05-25 | Disposition: A | Discharge status: Home / Self Care | Payer: Medicare Other | Source: Ambulatory Visit | Attending: General Surgery | Admitting: General Surgery

## 2011-05-25 ENCOUNTER — Encounter (HOSPITAL_COMMUNITY): Payer: Self-pay

## 2011-05-25 LAB — DIFFERENTIAL
Basophils Absolute: 0 10*3/uL (ref 0.0–0.1)
Lymphocytes Relative: 14 % (ref 12–46)
Neutro Abs: 11.4 10*3/uL — ABNORMAL HIGH (ref 1.7–7.7)

## 2011-05-25 LAB — SURGICAL PCR SCREEN
MRSA, PCR: NEGATIVE
Staphylococcus aureus: NEGATIVE

## 2011-05-25 LAB — COMPREHENSIVE METABOLIC PANEL
CO2: 28 mEq/L (ref 19–32)
Calcium: 9.9 mg/dL (ref 8.4–10.5)
Creatinine, Ser: 0.95 mg/dL (ref 0.50–1.10)
GFR calc Af Amer: 67 mL/min — ABNORMAL LOW (ref >90)
GFR calc non Af Amer: 58 mL/min — ABNORMAL LOW (ref >90)
Glucose, Bld: 83 mg/dL (ref 70–99)

## 2011-05-25 LAB — CBC
Hemoglobin: 14.6 g/dL (ref 12.0–15.0)
MCH: 29.6 pg (ref 26.0–34.0)
MCHC: 33.5 g/dL (ref 30.0–36.0)
MCV: 88.4 fL (ref 78.0–100.0)
Platelets: 420 10*3/uL — ABNORMAL HIGH (ref 150–400)
RBC: 4.93 MIL/uL (ref 3.87–5.11)

## 2011-05-25 NOTE — Pre-Procedure Instructions (Signed)
03/29/2011 Cardiology Nuclear Med study at [facility] on chart, 03/01/2011 CT chest with contrast -[place] on chart,04/26/2011 EKG at [facility] on chart, clearance from Dr. Shirlee Latch 04/13/2011 on chart.

## 2011-05-25 NOTE — Patient Instructions (Signed)
20 Calin Ellery  05/25/2011   Your procedure is scheduled on:  Friday 06/03/2011 at 0730  Report to [facility] at 0530 AM.  Call this number if you have problems the morning of surgery: [phone]   Remember:   Do not eat food:After Midnight.  May have clear liquids:until Midnight .  Clear liquids include soda, tea, black coffee, apple or grape juice, broth.  Take these medicines the morning of surgery with A SIP OF WATER: Norvasc, Protonix, Pravachol, use Qvar inhaler and Duoneb inhaler   Do not wear jewelry, make-up or nail polish.  Do not wear lotions, powders, or perfumes.   Do not shave 48 hours prior to surgery.(women only-shaving legs) Do not bring valuables to the hospital.  Contacts, dentures or bridgework may not be worn into surgery.  Leave suitcase in the car. After surgery it may be brought to your room.  For patients admitted to the hospital, checkout time is 11:00 AM the day of discharge.   Patients discharged the day of surgery will not be allowed to drive home.  Name and phone number of your driver:   Special Instructions: CHG Shower Use Special Wash: 1/2 bottle night before surgery and 1/2 bottle morning of surgery.   Please read over the following fact sheets that you were given: MRSA Information

## 2011-05-30 ENCOUNTER — Encounter (INDEPENDENT_AMBULATORY_CARE_PROVIDER_SITE_OTHER): Payer: Self-pay | Admitting: General Surgery

## 2011-05-30 ENCOUNTER — Ambulatory Visit (INDEPENDENT_AMBULATORY_CARE_PROVIDER_SITE_OTHER): Payer: Medicare Other | Admitting: General Surgery

## 2011-05-30 VITALS — BP 130/66 | HR 84 | Temp 98.8°F | Ht 65.0 in | Wt 154.8 lb

## 2011-05-30 DIAGNOSIS — K219 Gastro-esophageal reflux disease without esophagitis: Secondary | ICD-10-CM

## 2011-05-30 NOTE — Patient Instructions (Signed)
Your health has been stable this past month. Your workup is complete. We will plan to proceed with repair of your hiatal hernia and your anti-reflex procedure this coming Friday.

## 2011-05-30 NOTE — Progress Notes (Signed)
Patient ID: [name] [name], female   DOB: December 20, 1935, 76 y.o.   MRN: [id number]  Chief Complaint  Patient presents with  . Pre-op Exam    HPI [name] [name] is a 76 y.o. female.  This patient returns for her preop history and physical. She is scheduled for reduction and repair of hiatal hernia and Nissen fundoplication this coming Friday, January 25.  She is followed by [facility]  for primary care, Melvia Heaps for GI, and Dr. Marca Ancona for cardiology. She has a past history of coronary artery disease, status post PTCA 2003. She has a low large sliding hiatal hernia with a history suggesting torsion obstruction intermittently. She has recurrent esophageal stricture that has been dilated at least twice. She has a history of open appendectomy and laparoscopic cholecystectomy. She also has asthma and bronchitis with ongoing symptoms.  Over the past month she has been relatively stable. Her asthma is pretty good under pretty good control. She takes proton pump inhibitors twice a day that controls things fairly well. She does not have any abdominal pain.  She has had upper endoscopy recently. She's had upper GI recently.  The biggest issue in her life is affecting her husband sister is dying and will probably die within a week or 2 and her husband's brother-in-law died a little bit earlier this fall. His history of a lot of stress and she has been trying to decide whether to go ahead with her surgery but is pretty much decided that she does want to go ahead. HPI  Past Medical History  Diagnosis Date  . CAD (coronary artery disease)     LHC 2003 with 40% pLAD, 30% mLAD, 100% D1 (moderate vessel), 100%  D2 (small vessel), 95% mid small OM2.  Pt had PTCA to D1;  D2 and OM2 small vessels and tx medically;  Last Myoview (5/09):  EF 66%, anteroseptal/apical infarct with minimal peri-infarct ischemia.  No significant change from 11/06. Pt managed medically  . Recurrent aspiration bronchitis/pneumonia       PFTs 12/09 FVC 97%, FEV1 101%, ratio 77%, TLC 109%. No significant obstruction or restriction.   . Hypertension   . Hyperlipidemia   . Esophageal stricture   . Diverticulosis of colon   . Gastroesophageal reflux disease with hiatal hernia   . Iron deficiency anemia   . Gastritis   . GERD (gastroesophageal reflux disease)   . Hiatal hernia   . Asthma   . Blood transfusion   . Cough   . Wheezing   . Weakness   . Trouble swallowing   . Abdominal pain   . Hiatal hernia   . Shortness of breath     Past Surgical History  Procedure Date  . Back surgery   . Cholecystectomy   . Bladder repair   . Hemorrhoid surgery   . Cholecystectomy     Patient unsure of date.  [name] Appendectomy     patient unsure of date  . Total abdominal hysterectomy 1975    partial  . Coronary angioplasty 11/16/2001  . Eye surgery 11/2000    bilateral cataracts with lens implant    Family History  Problem Relation Age of Onset  . Cancer Mother     colon   . Colon cancer Mother 51  . Heart disease Father 65    heart attack    Social History History  Substance Use Topics  . Smoking status: Never Smoker   . Smokeless tobacco: Never Used  . Alcohol Use:  No    Allergies  Allergen Reactions  . Aspartame Diarrhea and Nausea And Vomiting  . Daliresp (Roflumilast) Nausea And Vomiting    Current Outpatient Prescriptions  Medication Sig Dispense Refill  . ALPRAZolam (XANAX) 0.5 MG tablet Take 0.5 mg by mouth at bedtime as needed. Sleep       . amLODipine (NORVASC) 10 MG tablet Take 10 mg by mouth daily with breakfast.       . aspirin EC 81 MG tablet Take 81 mg by mouth daily with breakfast.      . calcium carbonate (TUMS - DOSED IN MG ELEMENTAL CALCIUM) 500 MG chewable tablet Chew 1 tablet by mouth daily.       . Cholecalciferol (VITAMIN D) 1000 UNITS capsule Take 6,000 Units by mouth daily.       [name] CORAL CALCIUM PO Take 1 tablet by mouth daily.      . Cyanocobalamin (VITAMIN B 12 PO) Take 1  tablet by mouth daily.       [name] Dextromethorphan-Guaifenesin (MUCINEX DM PO) Take by mouth as needed. For congestion or cough      . enalapril (VASOTEC) 20 MG tablet Take 20 mg by mouth daily with breakfast.       . hydrochlorothiazide 25 MG tablet Take 25 mg by mouth daily with breakfast.       . HYDROcodone-acetaminophen (NORCO) 5-325 MG per tablet Take 1 tablet by mouth every 6 (six) hours as needed. Pain       . HYDROcodone-acetaminophen (VICODIN) 5-500 MG per tablet Take 1 tablet by mouth every 4 (four) hours as needed. As needed for pain      . ipratropium-albuterol (DUONEB) 0.5-2.5 (3) MG/3ML SOLN Take 3 mLs by nebulization 4 (four) times daily.  360 mL    . moxifloxacin (AVELOX) 400 MG tablet Take 400 mg by mouth daily.      . pantoprazole (PROTONIX) 40 MG tablet Take 40 mg by mouth 2 (two) times daily.       . pravastatin (PRAVACHOL) 40 MG tablet Take 40 mg by mouth at bedtime.       . predniSONE (DELTASONE) 20 MG tablet Take 20 mg by mouth 2 (two) times daily. Pt takes for 20 mg twice a day for 3 days and then 20 mg once a day for 5 days.      [name] QVAR 80 MCG/ACT inhaler Inhale 1 puff into the lungs 2 (two) times daily as needed. Wheezing         Review of Systems Review of Systems  Constitutional: Negative for fever, chills and unexpected weight change.  HENT: Negative for hearing loss, congestion, sore throat, trouble swallowing and voice change.   Eyes: Negative for visual disturbance.  Respiratory: Positive for cough. Negative for wheezing.   Cardiovascular: Negative for chest pain, palpitations and leg swelling.  Gastrointestinal: Negative for nausea, vomiting, abdominal pain, diarrhea, constipation, blood in stool, abdominal distention and anal bleeding.  Genitourinary: Negative for hematuria, vaginal bleeding and difficulty urinating.  Musculoskeletal: Negative for arthralgias.  Skin: Negative for rash and wound.  Neurological: Negative for seizures, syncope and headaches.    Hematological: Negative for adenopathy. Does not bruise/bleed easily.  Psychiatric/Behavioral: Negative for behavioral problems and confusion. The patient is nervous/anxious.     Blood pressure 130/66, pulse 84, temperature 98.8 F (37.1 C), temperature source Temporal, height 5\' 5"  (1.651 m), weight 154 lb 12.8 oz (70.217 kg), SpO2 97.00%.  Physical Exam Physical Exam  Constitutional: She is oriented to  person, place, and time. She appears well-developed and well-nourished. No distress.  HENT:  Head: Normocephalic and atraumatic.  Nose: Nose normal.  Mouth/Throat: No oropharyngeal exudate.  Eyes: Conjunctivae and EOM are normal. Pupils are equal, round, and reactive to light. Left eye exhibits no discharge. No scleral icterus.  Neck: Neck supple. No JVD present. No tracheal deviation present. No thyromegaly present.  Cardiovascular: Normal rate, regular rhythm, normal heart sounds and intact distal pulses.   No murmur heard. Pulmonary/Chest: Effort normal and breath sounds normal. No respiratory distress. She has no wheezes. She has no rales. She exhibits no tenderness.       No wheeze today.  Abdominal: Soft. Bowel sounds are normal. She exhibits no distension and no mass. There is no tenderness. There is no rebound and no guarding.    Musculoskeletal: She exhibits no edema and no tenderness.  Lymphadenopathy:    She has no cervical adenopathy.  Neurological: She is alert and oriented to person, place, and time. She exhibits normal muscle tone. Coordination normal.  Skin: Skin is warm. No rash noted. She is not diaphoretic. No erythema. No pallor.  Psychiatric: She has a normal mood and affect. Her behavior is normal. Judgment and thought content normal.    Data Reviewed I have reviewed all of her old records and her workup to date.  Assessment    Gastroesophageal reflux disease, complicated by a sliding hiatal hernia, possible intermittent obstruction, and esophageal  stricture requiring dilatation  Coronary disease, status post PTCA 2003  Status post laparoscopic cholecystectomy  Status post open appendectomy  Asthma bronchitis, relatively stable at present  History of anemia, resolved  Hypertension  Anxiety   Plan    Proceed with surgery this Friday. Plan laparoscopic reduction and repair of hiatal hernia and laparoscopic Nissen fundoplication, possible open.  We went  over the details of the procedure and the risks and complications in great detail today. She and her husband understand these issues. All questions are answered. She agrees with the plan. She does not want to  postpone her surgery.   Angelia Mould. Derrell Lolling, M.D., [facility] General and Minimally invasive Surgery Breast and Colorectal Surgery Office:   [phone] Pager:   [phone]     05/30/2011, 3:58 PM

## 2011-06-02 NOTE — H&P (Signed)
[name] [name]    MRN: [id number]   Description: 76 year old female  Provider: Ernestene Mention, MD  Department: [facility]        Diagnoses     Gastroesophageal reflux disease with hiatal hernia   - Primary    530.81      Reason for Visit     Pre-op Exam                  Progress Notes     Ernestene Mention, MD Patient ID: [name] [name], female   DOB: 12-Nov-1935, 76 y.o.   MRN: [id number]    Chief Complaint   Patient presents with   .  Pre-op Exam      HPI [name] [name] is a 76 y.o. female.  This patient returns for her preop history and physical. She is scheduled for reduction and repair of hiatal hernia and Nissen fundoplication this coming Friday, January 25.  She is followed by [facility]  for primary care, Melvia Heaps for GI, and Dr. Marca Ancona for cardiology. She has a past history of coronary artery disease, status post PTCA 2003. She has a low large sliding hiatal hernia with a history suggesting torsion obstruction intermittently. She has recurrent esophageal stricture that has been dilated at least twice. She has a history of open appendectomy and laparoscopic cholecystectomy. She also has asthma and bronchitis with ongoing symptoms.  Over the past month she has been relatively stable. Her asthma is pretty good under pretty good control. She takes proton pump inhibitors twice a day that controls things fairly well. She does not have any abdominal pain.  She has had upper endoscopy recently. She's had upper GI recently.  The biggest issue in her life is affecting her husband sister is dying and will probably die within a week or 2 and her husband's brother-in-law died a little bit earlier this fall. His history of a lot of stress and she has been trying to decide whether to go ahead with her surgery but is pretty much decided that she does want to go ahead.   Past Medical History   Diagnosis  Date   .  CAD (coronary artery disease)         LHC 2003  with 40% pLAD, 30% mLAD, 100% D1 (moderate vessel), 100%  D2 (small vessel), 95% mid small OM2.  Pt had PTCA to D1;  D2 and OM2 small vessels and tx medically;  Last Myoview (5/09):  EF 66%, anteroseptal/apical infarct with minimal peri-infarct ischemia.  No significant change from 11/06. Pt managed medically   .  Recurrent aspiration bronchitis/pneumonia         PFTs 12/09 FVC 97%, FEV1 101%, ratio 77%, TLC 109%. No significant obstruction or restriction.    .  Hypertension     .  Hyperlipidemia     .  Esophageal stricture     .  Diverticulosis of colon     .  Gastroesophageal reflux disease with hiatal hernia     .  Iron deficiency anemia     .  Gastritis     .  GERD (gastroesophageal reflux disease)     .  Hiatal hernia     .  Asthma     .  Blood transfusion     .  Cough     .  Wheezing     .  Weakness     .  Trouble swallowing     .  Abdominal pain     .  Hiatal hernia     .  Shortness of breath         Past Surgical History   Procedure  Date   .  Back surgery     .  Cholecystectomy     .  Bladder repair     .  Hemorrhoid surgery     .  Cholecystectomy         Patient unsure of date.   [name]  Appendectomy         patient unsure of date   .  Total abdominal hysterectomy  1975       partial   .  Coronary angioplasty  11/16/2001   .  Eye surgery  11/2000       bilateral cataracts with lens implant       Family History   Problem  Relation  Age of Onset   .  Cancer  Mother         colon    .  Colon cancer  Mother  70   .  Heart disease  Father  68       heart attack      Social History History   Substance Use Topics   .  Smoking status:  Never Smoker    .  Smokeless tobacco:  Never Used   .  Alcohol Use:  No       Allergies   Allergen  Reactions   .  Aspartame  Diarrhea and Nausea And Vomiting   .  Daliresp (Roflumilast)  Nausea And Vomiting       Current Outpatient Prescriptions   Medication  Sig  Dispense  Refill   .  ALPRAZolam (XANAX) 0.5 MG tablet   Take 0.5 mg by mouth at bedtime as needed. Sleep           .  amLODipine (NORVASC) 10 MG tablet  Take 10 mg by mouth daily with breakfast.          .  aspirin EC 81 MG tablet  Take 81 mg by mouth daily with breakfast.         .  calcium carbonate (TUMS - DOSED IN MG ELEMENTAL CALCIUM) 500 MG chewable tablet  Chew 1 tablet by mouth daily.          .  Cholecalciferol (VITAMIN D) 1000 UNITS capsule  Take 6,000 Units by mouth daily.          [name]  CORAL CALCIUM PO  Take 1 tablet by mouth daily.         .  Cyanocobalamin (VITAMIN B 12 PO)  Take 1 tablet by mouth daily.          [name]  Dextromethorphan-Guaifenesin (MUCINEX DM PO)  Take by mouth as needed. For congestion or cough         .  enalapril (VASOTEC) 20 MG tablet  Take 20 mg by mouth daily with breakfast.          .  hydrochlorothiazide 25 MG tablet  Take 25 mg by mouth daily with breakfast.          .  HYDROcodone-acetaminophen (NORCO) 5-325 MG per tablet  Take 1 tablet by mouth every 6 (six) hours as needed. Pain           .  HYDROcodone-acetaminophen (VICODIN) 5-500 MG per tablet  Take 1 tablet by mouth every 4 (four) hours as needed. As needed  for pain         .  ipratropium-albuterol (DUONEB) 0.5-2.5 (3) MG/3ML SOLN  Take 3 mLs by nebulization 4 (four) times daily.   360 mL      .  moxifloxacin (AVELOX) 400 MG tablet  Take 400 mg by mouth daily.         .  pantoprazole (PROTONIX) 40 MG tablet  Take 40 mg by mouth 2 (two) times daily.          .  pravastatin (PRAVACHOL) 40 MG tablet  Take 40 mg by mouth at bedtime.          .  predniSONE (DELTASONE) 20 MG tablet  Take 20 mg by mouth 2 (two) times daily. Pt takes for 20 mg twice a day for 3 days and then 20 mg once a day for 5 days.         [name]  QVAR 80 MCG/ACT inhaler  Inhale 1 puff into the lungs 2 (two) times daily as needed. Wheezing              Review of Systems   Constitutional: Negative for fever, chills and unexpected weight change.  HENT: Negative for hearing loss, congestion, sore  throat, trouble swallowing and voice change.   Eyes: Negative for visual disturbance.  Respiratory: Positive for cough. Negative for wheezing.   Cardiovascular: Negative for chest pain, palpitations and leg swelling.  Gastrointestinal: Negative for nausea, vomiting, abdominal pain, diarrhea, constipation, blood in stool, abdominal distention and anal bleeding.  Genitourinary: Negative for hematuria, vaginal bleeding and difficulty urinating.  Musculoskeletal: Negative for arthralgias.  Skin: Negative for rash and wound.  Neurological: Negative for seizures, syncope and headaches.  Hematological: Negative for adenopathy. Does not bruise/bleed easily.  Psychiatric/Behavioral: Negative for behavioral problems and confusion. The patient is nervous/anxious.     Blood pressure 130/66, pulse 84, temperature 98.8 F (37.1 C), temperature source Temporal, height 5\' 5"  (1.651 m), weight 154 lb 12.8 oz (70.217 kg), SpO2 97.00%.   Physical Exam   Constitutional: She is oriented to person, place, and time. She appears well-developed and well-nourished. No distress.  HENT:   Head: Normocephalic and atraumatic.   Nose: Nose normal.   Mouth/Throat: No oropharyngeal exudate.  Eyes: Conjunctivae and EOM are normal. Pupils are equal, round, and reactive to light. Left eye exhibits no discharge. No scleral icterus.  Neck: Neck supple. No JVD present. No tracheal deviation present. No thyromegaly present.  Cardiovascular: Normal rate, regular rhythm, normal heart sounds and intact distal pulses.    No murmur heard. Pulmonary/Chest: Effort normal and breath sounds normal. No respiratory distress. She has no wheezes. She has no rales. She exhibits no tenderness.       No wheeze today.  Abdominal: Soft. Bowel sounds are normal. She exhibits no distension and no mass. There is no tenderness. There is no rebound and no guarding.    Musculoskeletal: She exhibits no edema and no tenderness.  Lymphadenopathy:      She has no cervical adenopathy.  Neurological: She is alert and oriented to person, place, and time. She exhibits normal muscle tone. Coordination normal.  Skin: Skin is warm. No rash noted. She is not diaphoretic. No erythema. No pallor.  Psychiatric: She has a normal mood and affect. Her behavior is normal. Judgment and thought content normal.    Data Reviewed I have reviewed all of her old records and her workup to date.   Assessment   Gastroesophageal reflux disease, complicated by  a sliding hiatal hernia, possible intermittent obstruction, and esophageal stricture requiring dilatation  Coronary disease, status post PTCA 2003  Status post laparoscopic cholecystectomy  Status post open appendectomy  Asthma bronchitis, relatively stable at present  History of anemia, resolved  Hypertension  Anxiety    Plan Proceed with surgery this Friday. Plan laparoscopic reduction and repair of hiatal hernia and laparoscopic Nissen fundoplication, possible open.  We went  over the details of the procedure and the risks and complications in great detail today. She and her husband understand these issues. All questions are answered. She agrees with the plan. She does not want to  postpone her surgery.     Angelia Mould. Derrell Lolling, M.D., [facility] General and Minimally invasive Surgery Breast and Colorectal Surgery Office:   [phone] Pager:   [phone]    06/02/2011 2:06 PM

## 2011-06-03 ENCOUNTER — Inpatient Hospital Stay (HOSPITAL_COMMUNITY)
Admission: RE | Admit: 2011-06-03 | Discharge: 2011-06-05 | DRG: 328 | Disposition: A | Discharge status: Home / Self Care | Payer: Medicare Other | Source: Ambulatory Visit | Attending: General Surgery | Admitting: General Surgery

## 2011-06-03 ENCOUNTER — Inpatient Hospital Stay (HOSPITAL_COMMUNITY): Payer: Medicare Other | Admitting: Anesthesiology

## 2011-06-03 ENCOUNTER — Encounter (HOSPITAL_COMMUNITY)
Admission: RE | Disposition: A | Discharge status: Home / Self Care | Payer: Self-pay | Source: Ambulatory Visit | Attending: General Surgery

## 2011-06-03 ENCOUNTER — Encounter (HOSPITAL_COMMUNITY): Payer: Self-pay | Admitting: *Deleted

## 2011-06-03 ENCOUNTER — Encounter (HOSPITAL_COMMUNITY): Payer: Self-pay | Admitting: Anesthesiology

## 2011-06-03 DIAGNOSIS — K440 Diaphragmatic hernia with obstruction, without gangrene: Secondary | ICD-10-CM | POA: Diagnosis present

## 2011-06-03 DIAGNOSIS — K219 Gastro-esophageal reflux disease without esophagitis: Secondary | ICD-10-CM | POA: Diagnosis present

## 2011-06-03 DIAGNOSIS — K449 Diaphragmatic hernia without obstruction or gangrene: Secondary | ICD-10-CM

## 2011-06-03 DIAGNOSIS — J209 Acute bronchitis, unspecified: Secondary | ICD-10-CM

## 2011-06-03 DIAGNOSIS — K210 Gastro-esophageal reflux disease with esophagitis: Secondary | ICD-10-CM

## 2011-06-03 DIAGNOSIS — E785 Hyperlipidemia, unspecified: Secondary | ICD-10-CM | POA: Diagnosis present

## 2011-06-03 DIAGNOSIS — I10 Essential (primary) hypertension: Secondary | ICD-10-CM | POA: Diagnosis present

## 2011-06-03 DIAGNOSIS — I2510 Atherosclerotic heart disease of native coronary artery without angina pectoris: Secondary | ICD-10-CM | POA: Diagnosis present

## 2011-06-03 DIAGNOSIS — D509 Iron deficiency anemia, unspecified: Secondary | ICD-10-CM | POA: Diagnosis present

## 2011-06-03 DIAGNOSIS — J45909 Unspecified asthma, uncomplicated: Secondary | ICD-10-CM | POA: Diagnosis present

## 2011-06-03 HISTORY — PX: HIATAL HERNIA REPAIR: SHX195

## 2011-06-03 HISTORY — PX: LAPAROSCOPIC NISSEN FUNDOPLICATION: SHX1932

## 2011-06-03 LAB — CBC
HCT: 36 % (ref 36.0–46.0)
Hemoglobin: 11.9 g/dL — ABNORMAL LOW (ref 12.0–15.0)
MCV: 89.3 fL (ref 78.0–100.0)
RDW: 14.8 % (ref 11.5–15.5)
WBC: 19.4 10*3/uL — ABNORMAL HIGH (ref 4.0–10.5)

## 2011-06-03 LAB — CREATININE, SERUM: GFR calc Af Amer: 70 mL/min — ABNORMAL LOW (ref >90)

## 2011-06-03 SURGERY — REPAIR, HERNIA, HIATAL, LAPAROSCOPIC
Anesthesia: General | Site: Abdomen | Wound class: Clean

## 2011-06-03 MED ORDER — HEPARIN SODIUM (PORCINE) 5000 UNIT/ML IJ SOLN
5000.0000 [IU] | Freq: Three times a day (TID) | INTRAMUSCULAR | Status: DC
  Administered 2011-06-04 – 2011-06-05 (×4): 5000 [IU] via SUBCUTANEOUS
  Filled 2011-06-03 (×10): qty 1

## 2011-06-03 MED ORDER — ACETAMINOPHEN 10 MG/ML IV SOLN
INTRAVENOUS | Status: DC | PRN
  Administered 2011-06-03: 1000 mg via INTRAVENOUS

## 2011-06-03 MED ORDER — CEFAZOLIN SODIUM-DEXTROSE 2-3 GM-% IV SOLR
2.0000 g | INTRAVENOUS | Status: AC
  Administered 2011-06-03: 2 g via INTRAVENOUS

## 2011-06-03 MED ORDER — METOCLOPRAMIDE HCL 5 MG/ML IJ SOLN
5.0000 mg | Freq: Four times a day (QID) | INTRAMUSCULAR | Status: DC
  Administered 2011-06-03 – 2011-06-05 (×9): 5 mg via INTRAVENOUS
  Filled 2011-06-03 (×12): qty 1

## 2011-06-03 MED ORDER — POTASSIUM CHLORIDE IN NACL 20-0.9 MEQ/L-% IV SOLN
INTRAVENOUS | Status: DC
  Administered 2011-06-03 – 2011-06-04 (×4): via INTRAVENOUS
  Filled 2011-06-03 (×7): qty 1000

## 2011-06-03 MED ORDER — BUPIVACAINE-EPINEPHRINE 0.5% -1:200000 IJ SOLN
INTRAMUSCULAR | Status: DC | PRN
  Administered 2011-06-03: 24 mL

## 2011-06-03 MED ORDER — DIPHENHYDRAMINE HCL 50 MG/ML IJ SOLN
INTRAMUSCULAR | Status: DC | PRN
  Administered 2011-06-03: 12.5 mg via INTRAVENOUS

## 2011-06-03 MED ORDER — IPRATROPIUM-ALBUTEROL 0.5-2.5 (3) MG/3ML IN SOLN
3.0000 mL | Freq: Four times a day (QID) | RESPIRATORY_TRACT | Status: DC
  Filled 2011-06-03 (×15): qty 3

## 2011-06-03 MED ORDER — SUCCINYLCHOLINE CHLORIDE 20 MG/ML IJ SOLN
INTRAMUSCULAR | Status: DC | PRN
  Administered 2011-06-03: 100 mg via INTRAVENOUS

## 2011-06-03 MED ORDER — ESMOLOL HCL 10 MG/ML IV SOLN
INTRAVENOUS | Status: DC | PRN
  Administered 2011-06-03: 10 mg via INTRAVENOUS
  Administered 2011-06-03: 20 mg via INTRAVENOUS
  Administered 2011-06-03 (×3): 10 mg via INTRAVENOUS
  Administered 2011-06-03: 20 mg via INTRAVENOUS

## 2011-06-03 MED ORDER — ONDANSETRON HCL 4 MG/2ML IJ SOLN: 4.0000 mg | Freq: Four times a day (QID) | INTRAMUSCULAR | Status: DC | PRN

## 2011-06-03 MED ORDER — GLYCOPYRROLATE 0.2 MG/ML IJ SOLN
INTRAMUSCULAR | Status: DC | PRN
  Administered 2011-06-03: .4 mg via INTRAVENOUS

## 2011-06-03 MED ORDER — NEOSTIGMINE METHYLSULFATE 1 MG/ML IJ SOLN
INTRAMUSCULAR | Status: DC | PRN
  Administered 2011-06-03: 3 mg via INTRAVENOUS

## 2011-06-03 MED ORDER — METOPROLOL TARTRATE 1 MG/ML IV SOLN
5.0000 mg | Freq: Four times a day (QID) | INTRAVENOUS | Status: DC
  Administered 2011-06-03 – 2011-06-05 (×9): 5 mg via INTRAVENOUS
  Filled 2011-06-03 (×16): qty 5

## 2011-06-03 MED ORDER — IPRATROPIUM BROMIDE 0.02 % IN SOLN
0.5000 mg | Freq: Four times a day (QID) | RESPIRATORY_TRACT | Status: DC
  Administered 2011-06-03 – 2011-06-05 (×8): 0.5 mg via RESPIRATORY_TRACT
  Filled 2011-06-03 (×10): qty 2.5

## 2011-06-03 MED ORDER — LACTATED RINGERS IR SOLN
Status: DC | PRN
  Administered 2011-06-03: 1000 mL

## 2011-06-03 MED ORDER — DROPERIDOL 2.5 MG/ML IJ SOLN
INTRAMUSCULAR | Status: DC | PRN
  Administered 2011-06-03: 0.625 mg via INTRAVENOUS

## 2011-06-03 MED ORDER — PROMETHAZINE HCL 25 MG/ML IJ SOLN: 6.2500 mg | INTRAMUSCULAR | Status: DC | PRN

## 2011-06-03 MED ORDER — ONDANSETRON HCL 4 MG/2ML IJ SOLN
INTRAMUSCULAR | Status: DC | PRN
  Administered 2011-06-03: 4 mg via INTRAVENOUS

## 2011-06-03 MED ORDER — LACTATED RINGERS IV SOLN
INTRAVENOUS | Status: DC | PRN
  Administered 2011-06-03 (×2): via INTRAVENOUS

## 2011-06-03 MED ORDER — FLUTICASONE PROPIONATE HFA 44 MCG/ACT IN AERO
1.0000 | INHALATION_SPRAY | Freq: Two times a day (BID) | RESPIRATORY_TRACT | Status: DC
  Administered 2011-06-03 – 2011-06-05 (×5): 1 via RESPIRATORY_TRACT
  Filled 2011-06-03: qty 10.6

## 2011-06-03 MED ORDER — METOCLOPRAMIDE HCL 5 MG/ML IJ SOLN
INTRAMUSCULAR | Status: DC | PRN
  Administered 2011-06-03: 10 mg via INTRAVENOUS

## 2011-06-03 MED ORDER — PROPOFOL 10 MG/ML IV BOLUS
INTRAVENOUS | Status: DC | PRN
  Administered 2011-06-03: 120 mg via INTRAVENOUS

## 2011-06-03 MED ORDER — HYDROMORPHONE HCL PF 1 MG/ML IJ SOLN: 0.2500 mg | INTRAMUSCULAR | Status: DC | PRN

## 2011-06-03 MED ORDER — ALBUTEROL SULFATE (5 MG/ML) 0.5% IN NEBU
2.5000 mg | INHALATION_SOLUTION | Freq: Four times a day (QID) | RESPIRATORY_TRACT | Status: DC
  Administered 2011-06-03 – 2011-06-05 (×8): 2.5 mg via RESPIRATORY_TRACT
  Filled 2011-06-03 (×10): qty 0.5

## 2011-06-03 MED ORDER — MORPHINE SULFATE 2 MG/ML IJ SOLN
2.0000 mg | INTRAMUSCULAR | Status: DC | PRN
  Administered 2011-06-03 – 2011-06-05 (×6): 2 mg via INTRAVENOUS
  Filled 2011-06-03 (×6): qty 1

## 2011-06-03 MED ORDER — FENTANYL CITRATE 0.05 MG/ML IJ SOLN
INTRAMUSCULAR | Status: DC | PRN
  Administered 2011-06-03: 100 ug via INTRAVENOUS
  Administered 2011-06-03 (×4): 50 ug via INTRAVENOUS
  Administered 2011-06-03: 100 ug via INTRAVENOUS
  Administered 2011-06-03 (×2): 50 ug via INTRAVENOUS

## 2011-06-03 MED ORDER — 0.9 % SODIUM CHLORIDE (POUR BTL) OPTIME
TOPICAL | Status: DC | PRN
  Administered 2011-06-03: 1000 mL

## 2011-06-03 MED ORDER — ONDANSETRON HCL 4 MG PO TABS: 8.0000 mg | ORAL_TABLET | Freq: Four times a day (QID) | ORAL | Status: DC | PRN

## 2011-06-03 MED ORDER — LIDOCAINE HCL (CARDIAC) 20 MG/ML IV SOLN
INTRAVENOUS | Status: DC | PRN
  Administered 2011-06-03 (×2): 40 mg via INTRAVENOUS

## 2011-06-03 MED ORDER — ROCURONIUM BROMIDE 100 MG/10ML IV SOLN
INTRAVENOUS | Status: DC | PRN
  Administered 2011-06-03 (×2): 20 mg via INTRAVENOUS
  Administered 2011-06-03: 30 mg via INTRAVENOUS

## 2011-06-03 MED ORDER — HYDROMORPHONE HCL PF 1 MG/ML IJ SOLN
INTRAMUSCULAR | Status: DC | PRN
  Administered 2011-06-03 (×2): 1 mg via INTRAVENOUS

## 2011-06-03 MED ORDER — PANTOPRAZOLE SODIUM 40 MG IV SOLR
40.0000 mg | Freq: Two times a day (BID) | INTRAVENOUS | Status: DC
  Administered 2011-06-03 – 2011-06-05 (×5): 40 mg via INTRAVENOUS
  Filled 2011-06-03 (×9): qty 40

## 2011-06-03 MED ORDER — DEXAMETHASONE SODIUM PHOSPHATE 10 MG/ML IJ SOLN
INTRAMUSCULAR | Status: DC | PRN
  Administered 2011-06-03: 10 mg via INTRAVENOUS

## 2011-06-03 SURGICAL SUPPLY — 64 items
ADH SKN CLS APL DERMABOND .7 (GAUZE/BANDAGES/DRESSINGS)
APL SKNCLS STERI-STRIP NONHPOA (GAUZE/BANDAGES/DRESSINGS) ×1
APPLIER CLIP 5 13 M/L LIGAMAX5 (MISCELLANEOUS) ×2
APPLIER CLIP ROT 10 11.4 M/L (STAPLE)
APR CLP MED LRG 11.4X10 (STAPLE)
APR CLP MED LRG 5 ANG JAW (MISCELLANEOUS) ×1
BENZOIN TINCTURE PRP APPL 2/3 (GAUZE/BANDAGES/DRESSINGS) ×2 IMPLANT
CABLE HIGH FREQUENCY MONO STRZ (ELECTRODE) ×1 IMPLANT
CANISTER SUCTION 2500CC (MISCELLANEOUS) ×2 IMPLANT
CANNULA ENDOPATH XCEL 11M (ENDOMECHANICALS) ×3 IMPLANT
CLAMP ENDO BABCK 10MM (STAPLE) ×3 IMPLANT
CLIP APPLIE 5 13 M/L LIGAMAX5 (MISCELLANEOUS) IMPLANT
CLIP APPLIE ROT 10 11.4 M/L (STAPLE) IMPLANT
CLOSURE STERI STRIP 1/2 X4 (GAUZE/BANDAGES/DRESSINGS) ×1 IMPLANT
CLOTH BEACON ORANGE TIMEOUT ST (SAFETY) ×2 IMPLANT
COVER SURGICAL LIGHT HANDLE (MISCELLANEOUS) ×1 IMPLANT
DECANTER SPIKE VIAL GLASS SM (MISCELLANEOUS) ×2 IMPLANT
DERMABOND ADVANCED (GAUZE/BANDAGES/DRESSINGS)
DERMABOND ADVANCED .7 DNX12 (GAUZE/BANDAGES/DRESSINGS) IMPLANT
DEVICE SUT QUICK LOAD TK 5 (STAPLE) ×10 IMPLANT
DEVICE SUT TI-KNOT TK 5X26 (MISCELLANEOUS) ×2 IMPLANT
DEVICE SUTURE ENDOST 10MM (ENDOMECHANICALS) ×2 IMPLANT
DISSECTOR BLUNT TIP ENDO 5MM (MISCELLANEOUS) ×2 IMPLANT
DRAIN PENROSE 18X1/2 LTX STRL (DRAIN) ×2 IMPLANT
DRAPE LAPAROSCOPIC ABDOMINAL (DRAPES) ×2 IMPLANT
DRAPE POUCH INSTRU U-SHP 10X18 (DRAPES) ×2 IMPLANT
ELECT REM PT RETURN 9FT ADLT (ELECTROSURGICAL) ×2
ELECTRODE REM PT RTRN 9FT ADLT (ELECTROSURGICAL) ×1 IMPLANT
FELT TEFLON 4 X1 (Mesh General) ×2 IMPLANT
FILTER SMOKE EVAC LAPAROSHD (FILTER) IMPLANT
GLOVE BIOGEL PI IND STRL 7.0 (GLOVE) ×1 IMPLANT
GLOVE BIOGEL PI INDICATOR 7.0 (GLOVE) ×1
GLOVE EUDERMIC 7 POWDERFREE (GLOVE) ×2 IMPLANT
GOWN STRL NON-REIN LRG LVL3 (GOWN DISPOSABLE) ×2 IMPLANT
GOWN STRL REIN XL XLG (GOWN DISPOSABLE) ×4 IMPLANT
GRASPER ENDO BABCOCK 10 (MISCELLANEOUS) ×1 IMPLANT
GRASPER ENDO BABCOCK 10MM (MISCELLANEOUS)
HAND ACTIVATED (MISCELLANEOUS) ×2 IMPLANT
KIT BASIN OR (CUSTOM PROCEDURE TRAY) ×2 IMPLANT
NS IRRIG 1000ML POUR BTL (IV SOLUTION) ×2 IMPLANT
PENCIL BUTTON HOLSTER BLD 10FT (ELECTRODE) IMPLANT
RETRACTOR LAPSCP 12X46 CVD (ENDOMECHANICALS) IMPLANT
RTRCTR LAPSCP 12X46 CVD (ENDOMECHANICALS)
SCISSORS LAP 5X35 DISP (ENDOMECHANICALS) ×1 IMPLANT
SET IRRIG TUBING LAPAROSCOPIC (IRRIGATION / IRRIGATOR) ×2 IMPLANT
SOLUTION ANTI FOG 6CC (MISCELLANEOUS) ×2 IMPLANT
SPONGE GAUZE 4X4 12PLY (GAUZE/BANDAGES/DRESSINGS) ×1 IMPLANT
STAPLER VISISTAT 35W (STAPLE) ×2 IMPLANT
STRIP CLOSURE SKIN 1/2X4 (GAUZE/BANDAGES/DRESSINGS) IMPLANT
SUT MNCRL AB 4-0 PS2 18 (SUTURE) ×2 IMPLANT
SUT SURGIDAC NAB ES-9 0 48 120 (SUTURE) ×13 IMPLANT
TAPE CLOTH SURG 4X10 WHT LF (GAUZE/BANDAGES/DRESSINGS) ×1 IMPLANT
TIP INNERVISION DETACH 40FR (MISCELLANEOUS) IMPLANT
TIP INNERVISION DETACH 50FR (MISCELLANEOUS) ×1 IMPLANT
TIP INNERVISION DETACH 56FR (MISCELLANEOUS) IMPLANT
TIPS INNERVISION DETACH 40FR (MISCELLANEOUS)
TOWEL OR 17X26 10 PK STRL BLUE (TOWEL DISPOSABLE) ×4 IMPLANT
TRAY FOLEY CATH 14FRSI W/METER (CATHETERS) ×2 IMPLANT
TRAY LAP CHOLE (CUSTOM PROCEDURE TRAY) ×2 IMPLANT
TROCAR BLADELESS OPT 5 75 (ENDOMECHANICALS) ×5 IMPLANT
TROCAR XCEL BLUNT TIP 100MML (ENDOMECHANICALS) IMPLANT
TROCAR XCEL NON-BLD 11X100MML (ENDOMECHANICALS) ×2 IMPLANT
TUBING FILTER THERMOFLATOR (ELECTROSURGICAL) ×2 IMPLANT
TUBING INSUFFLATION 10FT LAP (TUBING) ×2 IMPLANT

## 2011-06-03 NOTE — Transfer of Care (Signed)
Immediate Anesthesia Transfer of Care Note  Patient: [name] [name]  Procedure(s) Performed:  LAPAROSCOPIC REPAIR OF HIATAL HERNIA; LAPAROSCOPIC NISSEN FUNDOPLICATION  Patient Location: PACU  Anesthesia Type: General  Level of Consciousness: awake, oriented and patient cooperative  Airway & Oxygen Therapy: Patient Spontanous Breathing  Post-op Assessment: Report given to PACU RN, Post -op Vital signs reviewed and stable and Patient moving all extremities  Post vital signs: Reviewed and stable  Complications: No apparent anesthesia complications

## 2011-06-03 NOTE — Anesthesia Postprocedure Evaluation (Signed)
  Anesthesia Post-op Note  Patient: [name] [name]  Procedure(s) Performed:  LAPAROSCOPIC REPAIR OF HIATAL HERNIA; LAPAROSCOPIC NISSEN FUNDOPLICATION  Patient Location: PACU  Anesthesia Type: General  Level of Consciousness: awake and alert   Airway and Oxygen Therapy: Patient Spontanous Breathing  Post-op Pain: mild  Post-op Assessment: Post-op Vital signs reviewed, Patient's Cardiovascular Status Stable, Respiratory Function Stable, Patent Airway and No signs of Nausea or vomiting  Post-op Vital Signs: stable  Complications: No apparent anesthesia complications. To telemetry per Dr. Derrell Lolling request.

## 2011-06-03 NOTE — Preoperative (Signed)
Beta Blockers   Reason not to administer Beta Blockers:Not Applicable, pt not on home BB 

## 2011-06-03 NOTE — Anesthesia Procedure Notes (Signed)
Procedure Name: Intubation Date/Time: 06/03/2011 7:35 AM Performed by: Randon Goldsmith CATHERINE PAYNE Pre-anesthesia Checklist: Patient identified, Emergency Drugs available, Suction available and Patient being monitored Patient Re-evaluated:Patient Re-evaluated prior to inductionOxygen Delivery Method: Circle System Utilized Preoxygenation: Pre-oxygenation with 100% oxygen Intubation Type: IV induction, Rapid sequence and Cricoid Pressure applied Laryngoscope Size: Mac and 3 Grade View: Grade I Tube type: Oral Number of attempts: 1 Airway Equipment and Method: stylet Secured at: 21 cm Tube secured with: Tape Dental Injury: Teeth and Oropharynx as per pre-operative assessment

## 2011-06-03 NOTE — Interval H&P Note (Signed)
History and Physical Interval Note:  06/03/2011 6:51 AM  [name] [name]  has presented today for surgery, with the diagnosis of hiatal hernia  The goals of treatment and the various methods of treatment have been discussed with the patient and family. After consideration of risks, benefits and other options for treatment, the patient has consented to  Procedure(s): LAPAROSCOPIC REPAIR OF HIATAL HERNIA LAPAROSCOPIC NISSEN FUNDOPLICATION as a surgical intervention .  The patients' history has been reviewed today, the patient examined today, there is no change in status, she is stable for surgery.  I have reviewed the patients' chart and labs.  Questions were answered to the patient's satisfaction.     Ernestene Mention 06/03/2011 6:52 AM

## 2011-06-03 NOTE — Progress Notes (Signed)
Utilization review completed.  

## 2011-06-03 NOTE — Op Note (Signed)
Patient Name:           [name] [name]   Date of Surgery:        06/03/2011  Pre op Diagnosis:      Gastroesophageal reflux disease with paraesophageal hernia and  Post op Diagnosis:    same  Procedure:                 Laparoscopic reduction and repair of hiatal hernia, laparoscopic Nissen fundoplication  Surgeon:                     Angelia Mould. Derrell Lolling, M.D., FACS  Assistant:                      Glenna Fellows, M.D., [facility]  Operative Indications:   This is a 76 year old Caucasian female with coronary artery disease, bronchitis and asthma, hypertension, history of cholecystectomy and history of appendectomy. She has a 10 year history of reflux symptoms, and  more recently she's had episodes of dysphagia and has had at least 2 esophageal dilatations. She's also had episodes of chest pain which are thought to be due to intermittent obstruction. CT scan shows a large hiatal hernia. UGI shows a large hiatal hernia. EGD shows moderate  stricture at the GE junction which was dilated. She's been evaluated from a cardiac standpoint found to be stable from a cardiac standpoint. Because of progressive complications and stricture and possible obstruction from her hernia she is brought to the operating room electively for repair.  Operative Findings:       She had some omental adhesions to the midline and just below the falciform ligament. She had significantly enlarged hiatus, but the crura were quite thick and could be re-approximated without tension. Moderate amount of stomach and the esophageal fat pad were up in the chest had to be dissected down, but once we did the dissection we had about 5 or 6 cm of esophagus below the diaphragm. We visualized and preserved the left posterior vagus nerve. The fundoplication was done very loosely  Procedure in Detail:          Following the induction of general endotracheal anesthesia, Foley catheter was inserted. The abdomen was prepped and draped in a sterile fashion.  Surgical time out was performed. Intravenous antibiotics were given. 0.5% Marcaine with epinephrine was used as local infiltration anesthetic.  An 11 mm optical port was placed in the right rectus sheath about 6 cm above the umbilicus. Entry was atraumatic. Pneumoperitoneum was created. A camera was inserted with visualization findings as described above. I Placed a 5 mm trocar in the right upper quadrant lateral to the initial port. Two 5 mm trocars were placed in the left upper quadrant. We took all the adhesions down and found that we had good visualization. We placed an 11 mm camera port in the left rectus sheath about 6 cm above the umbilicus. Nathanson  retractor was placed to hold the left lobe of liver in place with a self-retaining retractor.  We found that we could reduce part of the stomach just by simple traction. We pulled the stomach until we could see the edge of the hernia sac at the rim of the anterior part of the hiatus. We began our dissection in the anterior midline dissecting the peritoneum and sac both right and left until we went laterally and identify the crura anteriorly. We continued to dissect using the harmonic scalpel. We took down some of the gastrohepatic  ligament with the harmonic scalpel. We continued to dissect up into the mediastinum freeing up the esophagus and slowly pulling the stomach and esophagus away from the mediastinal attachments in small steps. We continued the dissection down the right and left crura until we had a window and then  passed the Penrose drain behind the gastroesophageal junction. This facilitated traction so we could complete the dissection. We spent some time taking down all the short gastric vessels and completely mobilizing the upper part of the greater curvature and fundus.  We closed the right and left crura of the diaphragm with #1 Surgidek sutures on Teflon pledgets. We did this over a 50 [place] dilator which was placed by Dr. Payton Doughty from the  anesthesia Department. Once all the sutures were placed we had only enough space between the dilator  to pass one 5 mm grasper throughand I felt this was adequate. We pulled the dilator back a little bit. We then set up fundoplication by bringing the fundus from the left side posteriorly around the esophagus to the right side. With traction on the esophageal fat pad we then made a judgment about what part of the fundus on the left to use.. We reinserted the dilator and then did a Nissen fundoplication over the dilator but made it very loose  because of suspected esophageal dysmotility. The fundoplication was performed using #1 Surgidek a taking a bite of the fundus of the left, a bite of the esophagus anteriorly and a bite of the fundus of the right. We placed 3 of these sutures and tied them with the Ti- knot device. We then tacked the left side of the fundus and the right side of the fundus to the anterior surfaces the diaphragm to hopefully prevent re- herniation. We inspected this and it looked good. The dilator was removed. There was no bleeding. The diaphragmatic repair was inspected and looked good. The fundoplication looked pink and healthy. The trocars were removed under vision. There was no bleeding from the trocar sites. Pneumoperitoneum was released. The skin incision closed with subcuticular sutures of 4-0 Monocryl and Dermabond.  Patient was taken to  recovery room in stable condition. There were no complications. EBL 20 cc the counts correct.     Angelia Mould. Derrell Lolling, M.D., FACS General and Minimally Invasive Surgery Breast and Colorectal Surgery  06/03/2011 9:48 AM

## 2011-06-03 NOTE — Anesthesia Preprocedure Evaluation (Signed)
Anesthesia Evaluation  Patient identified by MRN, date of birth, ID band Patient awake    Reviewed: Allergy & Precautions, H&P , NPO status , Patient's Chart, lab work & pertinent test results  Airway Mallampati: II TM Distance: >3 FB Neck ROM: Full    Dental No notable dental hx.    Pulmonary shortness of breath, asthma , pneumonia ,  clear to auscultation  Pulmonary exam normal       Cardiovascular hypertension, Pt. on medications + CAD Regular Normal    Neuro/Psych  Neuromuscular disease Negative Psych ROS   GI/Hepatic Neg liver ROS, hiatal hernia, GERD-  Medicated,  Endo/Other  Negative Endocrine ROS  Renal/GU negative Renal ROS  Genitourinary negative   Musculoskeletal negative musculoskeletal ROS (+)   Abdominal   Peds negative pediatric ROS (+)  Hematology negative hematology ROS (+)   Anesthesia Other Findings   Reproductive/Obstetrics negative OB ROS                           Anesthesia Physical Anesthesia Plan  ASA: III  Anesthesia Plan: General   Post-op Pain Management:    Induction: Intravenous  Airway Management Planned: Oral ETT  Additional Equipment:   Intra-op Plan:   Post-operative Plan: Extubation in OR  Informed Consent: I have reviewed the patients History and Physical, chart, labs and discussed the procedure including the risks, benefits and alternatives for the proposed anesthesia with the patient or authorized representative who has indicated his/her understanding and acceptance.   Dental advisory given  Plan Discussed with: CRNA  Anesthesia Plan Comments:         Anesthesia Quick Evaluation

## 2011-06-04 ENCOUNTER — Inpatient Hospital Stay (HOSPITAL_COMMUNITY): Payer: Medicare Other

## 2011-06-04 LAB — COMPREHENSIVE METABOLIC PANEL
AST: 114 U/L — ABNORMAL HIGH (ref 0–37)
Albumin: 3 g/dL — ABNORMAL LOW (ref 3.5–5.2)
Alkaline Phosphatase: 66 U/L (ref 39–117)
Chloride: 104 mEq/L (ref 96–112)
Potassium: 4.4 mEq/L (ref 3.5–5.1)
Total Bilirubin: 0.6 mg/dL (ref 0.3–1.2)

## 2011-06-04 NOTE — Progress Notes (Signed)
Paged MD for permission to progress diet.

## 2011-06-04 NOTE — Progress Notes (Signed)
Patient ID: Domenique Quest, female   DOB: 07-Nov-1935, 76 y.o.   MRN: [id number] 1 Day Post-Op  Subjective: No C/O except mild incisional soreness  Objective: Vital signs in last 24 hours: Temp:  [97.7 F (36.5 C)-98.5 F (36.9 C)] 98.3 F (36.8 C) (01/26 0628) Pulse Rate:  [82-109] 86  (01/26 0628) Resp:  [15-19] 17  (01/26 0628) BP: (112-145)/(44-76) 116/62 mmHg (01/26 0628) SpO2:  [91 %-100 %] 97 % (01/26 0821) FiO2 (%):  [2 %] 2 % (01/25 1114) Weight:  [163 lb 6.4 oz (74.118 kg)] 163 lb 6.4 oz (74.118 kg) (01/25 1114) Last BM Date: 06/02/11  Intake/Output from previous day: 01/25 0701 - 01/26 0700 In: 3500 [I.V.:3500] Out: 1080 [Urine:1030; Blood:50] Intake/Output this shift:    General appearance: alert and no distress GI: abnormal findings:  mild tenderness in the upper abdomen Incision/Wound:Dressed, clean and dry  Lab Results:   Basename 06/03/11 1148  WBC 19.4*  HGB 11.9*  HCT 36.0  PLT 289   BMET  Basename 06/04/11 0449 06/03/11 1148  NA 137 --  K 4.4 --  CL 104 --  CO2 23 --  GLUCOSE 126* --  BUN 10 --  CREATININE 0.75 0.92  CALCIUM 8.8 --   PT/INR No results found for this basename: LABPROT:2,INR:2 in the last 72 hours ABG No results found for this basename: PHART:2,PCO2:2,PO2:2,HCO3:2 in the last 72 hours  Studies/Results: No results found.  Anti-infectives: Anti-infectives     Start     Dose/Rate Route Frequency Ordered Stop   06/03/11 0545   ceFAZolin (ANCEF) IVPB 2 g/50 mL premix        2 g 100 mL/hr over 30 Minutes Intravenous 60 min pre-op 06/03/11 0540 06/03/11 0737          Assessment/Plan: s/p Procedure(s): LAPAROSCOPIC REPAIR OF HIATAL HERNIA LAPAROSCOPIC NISSEN FUNDOPLICATION Appears stable.  For gastrograffin swallow this AM   LOS: 1 day    Maryjane Benedict T 06/04/2011

## 2011-06-04 NOTE — Progress Notes (Signed)
Patient had yellow-green mucous that she coughed up.  I encouraged patient to cough and deep breath and instructed on how to use incentive spirometry.  Patient was able to demonstrate appropriate use.

## 2011-06-04 NOTE — Progress Notes (Signed)
Paged MD for permission for patient to go off tele for DG UGI

## 2011-06-05 LAB — CBC
MCH: 29.4 pg (ref 26.0–34.0)
MCV: 90.8 fL (ref 78.0–100.0)
Platelets: 280 10*3/uL (ref 150–400)
RDW: 15.2 % (ref 11.5–15.5)

## 2011-06-05 MED ORDER — OXYCODONE-ACETAMINOPHEN 5-325 MG PO TABS: 1.0000 | ORAL_TABLET | ORAL | Status: DC | PRN

## 2011-06-05 NOTE — Discharge Summary (Addendum)
Patient ID: [name] [name] [id number] 76 y.o. 1935-09-07  06/03/2011  Admission Date:                 Friday,  06/03/2011 Discharge date and time: Sunday,  06/05/2011  Admitting Physician: Ernestene Mention  Discharge Physician: Ernestene Mention  Admission Diagnoses: Type III hiatal hernia, gastroesophageal reflux disease  Discharge Diagnoses: same  Operations: Procedure(s): LAPAROSCOPIC REPAIR OF HIATAL HERNIA LAPAROSCOPIC NISSEN FUNDOPLICATION  Admission Condition: good  Discharged Condition: good  Indication for Admission: this is a 76 year old Caucasian female with a past history of coronary artery disease, cardiac catheterization with stent placement 2003, recurrent bronchitis and pneumonia, hypertension, Iron deficiency anemia, gastroesophageal reflux disease and esophageal stricture.   She began having more problems with dysphagia and more problems with intermittent episodes of substernal chest pain, suspected as being torsion of her hiatal hernia.  She's had extensive GI workup including upper endoscopy and colonoscopy.  She's had esophageal dilatations at least twice.  Her asthma has come under better control with intensive medical care lately, although she did have a recent episode of bronchitis last month.  She was referred to me for evaluation.  We did a preop  upper GI series and  she had a type III  sliding and paraesophageal hiatal hernia with at least one third of the stomach up into the chest and diminished esophageal motility by fluoroscopy.  She has recently been evaluated by Melvia Heaps from a GI standpoint.  She's recently been evaluated by Dr. Marca Ancona from cardiology.  She want to go ahead and have surgery to repair her hiatal hernia at this time.  Hospital Course: On the day of admission the patient was taken to the operating room.  She underwent laparoscopic reduction and repair of her hiatal hernia, closure of her diaphragm with pledgeted sutures, and a very  loose Nissen fundoplication.  The surgery was uneventful.  She remained stable postop.  On postop day 1 she had a water-soluble upper GI and that looked good with no obstruction and no leak and the fundoplication and entire stomach below the diaphragm.  Her diet was advanced to clear liquids and then on to  full liquids on POD#2. [name]  On postoperative day #2 she was feeling okay although a little bit sore.  One of her family members who was in palliative care died, not unexpectedly,  and she requested discharge home to be with the family.  At the time of discharge she was tolerating full liquids without dysphagia or nausea.  She was stable but did not appear to be any distress. Her abdomen was soft and her wounds looked good.  She was asked to stay on a full liquid diet until she returned to see me in the office in 10 days.  She was told to stay on her same outpatient medications that had been prescribed by Dr. Lucky Cowboy.  Consults: None  Significant Diagnostic Studies: water soluble UGI  Treatments: surgery: laparoscopic repair of type III hiatal hernia, laparoscopic Nissen fundoplication.  Disposition: Home  Patient Instructions:   [name], [name]  Home Medication Instructions JYN:[id number]   Printed on:06/05/11 1513  Medication Information                    ALPRAZolam (XANAX) 0.5 MG tablet Take 0.5 mg by mouth at bedtime as needed. Sleep            calcium carbonate (TUMS - DOSED IN MG ELEMENTAL CALCIUM) 500 MG chewable tablet Chew  1 tablet by mouth daily.            amLODipine (NORVASC) 10 MG tablet Take 10 mg by mouth daily with breakfast.            pravastatin (PRAVACHOL) 40 MG tablet Take 40 mg by mouth at bedtime.            Cyanocobalamin (VITAMIN B 12 PO) Take 1 tablet by mouth daily.            Cholecalciferol (VITAMIN D) 1000 UNITS capsule Take 6,000 Units by mouth daily.            ipratropium-albuterol (DUONEB) 0.5-2.5 (3) MG/3ML SOLN Take 3 mLs by  nebulization 4 (four) times daily.           enalapril (VASOTEC) 20 MG tablet Take 20 mg by mouth daily with breakfast.            hydrochlorothiazide 25 MG tablet Take 25 mg by mouth daily with breakfast.            HYDROcodone-acetaminophen (VICODIN) 5-500 MG per tablet Take 1 tablet by mouth every 4 (four) hours as needed. As needed for pain           Dextromethorphan-Guaifenesin (MUCINEX DM PO) Take by mouth as needed. For congestion or cough           pantoprazole (PROTONIX) 40 MG tablet Take 40 mg by mouth 2 (two) times daily.            predniSONE (DELTASONE) 20 MG tablet Take 20 mg by mouth 2 (two) times daily. Pt takes for 20 mg twice a day for 3 days and then 20 mg once a day for 5 days.           HYDROcodone-acetaminophen (NORCO) 5-325 MG per tablet Take 1 tablet by mouth every 6 (six) hours as needed. Pain            CORAL CALCIUM PO Take 1 tablet by mouth daily.           aspirin EC 81 MG tablet Take 81 mg by mouth daily with breakfast.             Activity: activity as tolerated Diet: full liquid diet until seen in the office. Instructions regarding eating after esophageal surgery discussed. Wound Care: no specific wound care. Keep wounds clean. Okay to shower.  Follow-up:  With Dr. Derrell Lolling in 10 days.  Signed: Angelia Mould. Derrell Lolling, M.D., FACS General and minimally invasive surgery Breast and Colorectal Surgery  06/05/2011, 3:13 PM

## 2011-06-05 NOTE — Progress Notes (Signed)
Patient ID: [name] [name], female   DOB: [birth year], 76 y.o.   MRN: [id number] 2 Days Post-Op  Subjective: Very sore in epigastrium.  Tol CL   Objective: Vital signs in last 24 hours: Temp:  [98 F (36.7 C)-98.8 F (37.1 C)] 98.8 F (37.1 C) (01/27 5621) Pulse Rate:  [90-105] 93  (01/27 0632) Resp:  [16-19] 18  (01/27 3086) BP: (111-133)/(61-73) 130/71 mmHg (01/27 0632) SpO2:  [92 %-97 %] 92 % (01/27 5784) Last BM Date: 06/02/11  Intake/Output from previous day: 01/26 0701 - 01/27 0700 In: 2780 [P.O.:1380; I.V.:1400] Out: 3300 [Urine:3300] Intake/Output this shift:    General appearance: alert, cooperative and no distress GI: normal findings: soft, non-tender Incision/Wound:Clean and dry  Lab Results:   Basename 06/05/11 0540 06/03/11 1148  WBC 9.0 19.4*  HGB 10.9* 11.9*  HCT 33.7* 36.0  PLT 280 289   BMET  Basename 06/04/11 0449 06/03/11 1148  NA 137 --  K 4.4 --  CL 104 --  CO2 23 --  GLUCOSE 126* --  BUN 10 --  CREATININE 0.75 0.92  CALCIUM 8.8 --   PT/INR No results found for this basename: LABPROT:2,INR:2 in the last 72 hours ABG No results found for this basename: PHART:2,PCO2:2,PO2:2,HCO3:2 in the last 72 hours  Studies/Results: Dg Ugi W/water Sol Cm  06/04/2011  *RADIOLOGY REPORT*  Clinical Data: Postop Nissen fundoplication for hernia repair  ESOPHOGRAM/BARIUM SWALLOW  Technique:  Combined double contrast and single contrast examination performed using effervescent crystals, thick barium liquid, and thin barium liquid.  Fluoroscopy time:  1.1 minutes.  Comparison:  CT 04/26/2011, upper GI 04/08/2011  Findings: The hiatal hernia has been repaired with the stomach now beneath the hemidiaphragms.  Contrast flows freely through the GE junction/hernia wrap.  No evidence of extraluminal contrast to suggest leak.  Small amount residual contrast within the esophagus.  IMPRESSION:  1. Successful hiatal  hernia repair with the stomach beneath hemidiaphragms. 2.  No  evidence of leak or obstruction through the fundoplication wrap / GE junction.  Original Report Authenticated By: Genevive Bi, M.D.    Anti-infectives: Anti-infectives     Start     Dose/Rate Route Frequency Ordered Stop   06/03/11 0545   ceFAZolin (ANCEF) IVPB 2 g/50 mL premix        2 g 100 mL/hr over 30 Minutes Intravenous 60 min pre-op 06/03/11 0540 06/03/11 0737          Assessment/Plan: s/p Procedure(s): LAPAROSCOPIC REPAIR OF HIATAL HERNIA LAPAROSCOPIC NISSEN FUNDOPLICATION  Stable post op Start FL diet Transition to oral pain med  LOS: 2 days    Halayna Blane T 06/05/2011

## 2011-06-06 ENCOUNTER — Telehealth (INDEPENDENT_AMBULATORY_CARE_PROVIDER_SITE_OTHER): Payer: Self-pay

## 2011-06-06 NOTE — Telephone Encounter (Signed)
Per Dr Jacinto Halim request pt given po appt for 2-5. Pt encouraged to push liquids. Pt states she is tolerating liquid diet well. Pt will call with any concerns.

## 2011-06-07 ENCOUNTER — Encounter (HOSPITAL_COMMUNITY): Payer: Self-pay | Admitting: General Surgery

## 2011-06-09 ENCOUNTER — Telehealth (INDEPENDENT_AMBULATORY_CARE_PROVIDER_SITE_OTHER): Payer: Self-pay

## 2011-06-09 NOTE — Telephone Encounter (Signed)
Pt called asking to increase her diet if Dr Derrell Lolling will allow. Reviewed with Dr Derrell Lolling. Per Dr Jacinto Halim request pt advised she can advance to a blenderized diet, no meat,no bread. Pt advised all food, fruit and vegetables must be placed in blender and blended to fine consistency. Pt advised she cannot have anything that requires chewing.  Pt states she understands.

## 2011-06-14 ENCOUNTER — Encounter (INDEPENDENT_AMBULATORY_CARE_PROVIDER_SITE_OTHER): Payer: Medicare Other | Admitting: General Surgery

## 2011-06-20 ENCOUNTER — Encounter (INDEPENDENT_AMBULATORY_CARE_PROVIDER_SITE_OTHER): Payer: Self-pay | Admitting: General Surgery

## 2011-06-20 ENCOUNTER — Ambulatory Visit (INDEPENDENT_AMBULATORY_CARE_PROVIDER_SITE_OTHER): Payer: Medicare Other | Admitting: General Surgery

## 2011-06-20 VITALS — BP 136/70 | HR 68 | Temp 98.1°F | Resp 18 | Ht 65.0 in | Wt 149.2 lb

## 2011-06-20 DIAGNOSIS — Z9889 Other specified postprocedural states: Secondary | ICD-10-CM

## 2011-06-20 DIAGNOSIS — K449 Diaphragmatic hernia without obstruction or gangrene: Secondary | ICD-10-CM

## 2011-06-20 NOTE — Progress Notes (Signed)
Subjective:     Patient ID: [name] [name], female   DOB: Oct 06, 1935, 76 y.o.   MRN: [id number]  HPI This patient underwent reduction and repair of large hiatal hernia and Nissen fundoplication on June 03, 2011. She has done very well. She has no dysphagia. She is able to burp. She has no reflux. She feels much better. She has no wound problems. Review of Systems     Objective:   Physical Exam The patient looks well she is in no distress.  Abdomen is soft and nontender. All the trocar sites have healed uneventfully.    Assessment:     Large hiatal hernia and GERD.  Recovering uneventfully in the early postop period following hiatal hernia repair and Nissen fundoplication.    Plan:     Advance diet as instructed.  Upper GI in 6 weeks  Resume normal activities, no restriction  See me in 8 weeks.   Angelia Mould. Derrell Lolling, M.D., [facility] General and Minimally invasive Surgery Breast and Colorectal Surgery Office:   [phone] Pager:   [phone]

## 2011-06-20 NOTE — Patient Instructions (Signed)
You seemed to be doing very well following your hiatal hernia repair. You may slowly advance your diet as instructed. Chew food well. Swallow small bites. You will be scheduled for an upper GI swallow about 6 weeks from now. I will see you in about 8 weeks for a followup check.

## 2011-06-21 ENCOUNTER — Encounter (INDEPENDENT_AMBULATORY_CARE_PROVIDER_SITE_OTHER): Payer: Medicare Other | Admitting: General Surgery

## 2011-07-01 ENCOUNTER — Telehealth (INDEPENDENT_AMBULATORY_CARE_PROVIDER_SITE_OTHER): Payer: Self-pay

## 2011-07-01 NOTE — Telephone Encounter (Signed)
Pt calling wanting to know if she can increase her diet to include bread. Surgery for lap nissen was 1-25 and pt is on soft diet at this time. Will review with Dr Derrell Lolling and then respond to pts request.

## 2011-07-01 NOTE — Telephone Encounter (Signed)
Message copied by Joanette Gula on Fri Jul 01, 2011  1:33 PM ------      Message from: Ernestene Mention      Created: Fri Jul 01, 2011 12:50 PM       She may advance diet. Chew food well. Swallow small bites.

## 2011-07-01 NOTE — Telephone Encounter (Signed)
Per Dr Derrell Lolling pt advised she can advance diet but to take very small bites and chew well. Pt will call with any questions or concerns.

## 2011-08-04 ENCOUNTER — Ambulatory Visit
Admission: RE | Admit: 2011-08-04 | Discharge: 2011-08-04 | Disposition: A | Discharge status: Home / Self Care | Payer: Medicare Other | Source: Ambulatory Visit | Attending: General Surgery | Admitting: General Surgery

## 2011-08-04 DIAGNOSIS — Z9889 Other specified postprocedural states: Secondary | ICD-10-CM

## 2011-08-04 DIAGNOSIS — K449 Diaphragmatic hernia without obstruction or gangrene: Secondary | ICD-10-CM

## 2011-08-15 ENCOUNTER — Ambulatory Visit (INDEPENDENT_AMBULATORY_CARE_PROVIDER_SITE_OTHER): Payer: Medicare Other | Admitting: General Surgery

## 2011-08-15 ENCOUNTER — Encounter (INDEPENDENT_AMBULATORY_CARE_PROVIDER_SITE_OTHER): Payer: Self-pay | Admitting: General Surgery

## 2011-08-15 VITALS — BP 138/66 | HR 100 | Temp 98.5°F | Resp 20 | Ht 65.0 in | Wt 150.2 lb

## 2011-08-15 DIAGNOSIS — K449 Diaphragmatic hernia without obstruction or gangrene: Secondary | ICD-10-CM

## 2011-08-15 NOTE — Patient Instructions (Signed)
You have done very well following your hiatal hernia surgery. Your upper GI xray  looks great. There is no blockage and the repair is positioned correctly.  You may slowly wean off of protonix and  see what happens.  I advise you to chew her food carefully, swallow small bites, and avoid over eating.  Return to see Dr. Derrell Lolling if any new problems arise.

## 2011-08-15 NOTE — Progress Notes (Signed)
Subjective:     Patient ID: [name] [name], female   DOB: [birth year], 76 y.o.   MRN: [id number]  HPI  This 76 year old woman underwent laparoscopic repair of a large hiatal hernia and Nissen fundoplication June 03, 2011. She is doing very well. She says her asthma is better. She has no dysphagia or pain. She is able to swallow meats and breads. She is able to burp. She has had no nausea. She feels that the surgery has benefited her.  Upper GI on March 28 looks good. There is a little dysmotility of the esophagus, but there is no obstruction and the fundoplication is well positioned below the diaphragm. Review of Systems     Objective:   Physical Exam The patient looks well. Her husband is with her. Spirits are good.  Abdomen is soft and nontender. All of the trocar sites are well healed.    Assessment:     Reflux esophagitis and large hiatal hernia, uneventful recovery following laparoscopic repair and Nissen fundoplication.    Plan:     No restriction on physical activities.  Diet and eating habits discussed.  Return to see me if further problems arise.   Angelia Mould. Derrell Lolling, M.D., [facility] General and Minimally invasive Surgery Breast and Colorectal Surgery Office:   [phone] Pager:   [phone]

## 2012-01-10 ENCOUNTER — Ambulatory Visit (HOSPITAL_COMMUNITY)
Admission: RE | Admit: 2012-01-10 | Discharge: 2012-01-10 | Disposition: A | Discharge status: Home / Self Care | Payer: Medicare Other | Source: Ambulatory Visit | Attending: Internal Medicine | Admitting: Internal Medicine

## 2012-01-10 ENCOUNTER — Other Ambulatory Visit (HOSPITAL_COMMUNITY): Payer: Self-pay | Admitting: Internal Medicine

## 2012-01-10 DIAGNOSIS — R0602 Shortness of breath: Secondary | ICD-10-CM

## 2012-01-10 DIAGNOSIS — K449 Diaphragmatic hernia without obstruction or gangrene: Secondary | ICD-10-CM | POA: Insufficient documentation

## 2012-01-13 ENCOUNTER — Ambulatory Visit (INDEPENDENT_AMBULATORY_CARE_PROVIDER_SITE_OTHER): Payer: Medicare Other | Admitting: Emergency Medicine

## 2012-01-13 ENCOUNTER — Encounter: Payer: Self-pay | Admitting: Emergency Medicine

## 2012-01-13 VITALS — BP 118/62 | HR 96 | Temp 98.0°F | Ht 65.0 in | Wt 163.4 lb

## 2012-01-13 DIAGNOSIS — J45909 Unspecified asthma, uncomplicated: Secondary | ICD-10-CM

## 2012-01-13 NOTE — Progress Notes (Signed)
76 yowf never smoker with large hiatal hernia & recurrent asthmatic bronchitis , about twice/yr for many yrs. Lung parenchyma looks ok on Ct scans 6/08 & 12/10. PFTs nml in 12/09 & mild airway obstruction in 4/11.  First presented 3/09 . CT neck for thyroid imaging s/o mediastinal contour abnormal. Ct chest >> large hiatal hernia & BL upper lobe hazy , ground glass opacity.   April 28, 2009  GI wu ongoing with Dr [name] for Fe def anemia requiring transfusions, small bowel AVMs suspected.   July 21, 2009 cc Acute visit- c/o cough and wheezing >> augmentin helped, enalapril changed to diovan   September 02, 2009 12:23 PM  reviewed PFTs, c/o dyspnea, HB nml, wheezing +, does not desatn on exertion.   January 25, 2010>>acute office visit. Complains of c.o cough with production with thick dark green, wheezing chest tightness, sob this am before taking neb. >>rx Avelox.   February 02, 2010--Presents for 1 week rov - Finished Doxy and Avelox today - On Pred 10mg    March 22, 2010 10:20 AM  Adm 10/11-12/11 for exacerbation - pred x 4 weeks, z-pak & avelox.  Symbicort made her feel wierd, just completed long taper of prednisone  Denies chest pain, orthopnea, hemoptysis, fever, n/v/d, edema, headache.   Acute OV 01/13/12 -- hx COPD/asthmatic bronchitis. She formally had freq exacerbations, but she has done quite well for the last year - every since she had her hiatal hernia addressed. She was exposed to dust, pollen last week >> significant worsening in congestion, cough, wheeze.  She is doing Duonebs 3x a day right now. She is back on enalapril. Her breathing and cough are close to baseline after the pred.    Filed Vitals:   01/13/12 1200  BP: 118/62  Pulse: 96  Temp: 98 F (36.7 C)   Gen: Pleasant, overwt, in no distress,  normal affect  ENT: No lesions,  mouth clear,  oropharynx clear, no postnasal drip  Neck: No JVD, no TMG, insp and exp stridor  Lungs: No use of accessory muscles,  referred UA noise, no crackles.   Cardiovascular: RRR, heart sounds normal, no murmur or gallops, no peripheral edema  Musculoskeletal: No deformities, no cyanosis or clubbing  Neuro: alert, non focal  Skin: Warm, no lesions or rashes  ASTHMA Acute flare of allergies, nasal congestion, cough, probable asthma. Improved after rx pred (+ abx). Her number of exacerbations has decreased since she had ger hiatal hernia fixed. This flare sounds clearly exposure related.  - have encouraged her to wear mask when she works in the yard - finish pred - re-establish with RA in the near future.

## 2012-01-13 NOTE — Assessment & Plan Note (Signed)
Acute flare of allergies, nasal congestion, cough, probable asthma. Improved after rx pred (+ abx). Her number of exacerbations has decreased since she had ger hiatal hernia fixed. This flare sounds clearly exposure related.  - have encouraged her to wear mask when she works in the yard - finish pred - re-establish with RA in the near future.

## 2012-01-13 NOTE — Patient Instructions (Addendum)
Please finish your Prednisone and azithromycin Take your DuoNebs on your regular schedule Always wear your mask when you mow the grass Follow with Dr Vassie Loll in 2 months

## 2012-01-25 ENCOUNTER — Ambulatory Visit (INDEPENDENT_AMBULATORY_CARE_PROVIDER_SITE_OTHER): Payer: Medicare Other | Admitting: Internal Medicine

## 2012-01-25 ENCOUNTER — Encounter: Payer: Self-pay | Admitting: Internal Medicine

## 2012-01-25 VITALS — BP 112/60 | HR 91 | Temp 97.8°F | Ht 65.0 in | Wt 160.0 lb

## 2012-01-25 DIAGNOSIS — J45909 Unspecified asthma, uncomplicated: Secondary | ICD-10-CM

## 2012-01-25 DIAGNOSIS — I10 Essential (primary) hypertension: Secondary | ICD-10-CM

## 2012-01-25 MED ORDER — PREDNISONE (PAK) 10 MG PO TABS: ORAL_TABLET | ORAL | Status: DC

## 2012-01-25 MED ORDER — AMOXICILLIN-POT CLAVULANATE 875-125 MG PO TABS: 1.0000 | ORAL_TABLET | Freq: Two times a day (BID) | ORAL | Status: DC

## 2012-01-25 MED ORDER — MOMETASONE FURO-FORMOTEROL FUM 200-5 MCG/ACT IN AERO: INHALATION_SPRAY | RESPIRATORY_TRACT | Status: DC

## 2012-01-25 MED ORDER — OLMESARTAN MEDOXOMIL 20 MG PO TABS: 20.0000 mg | ORAL_TABLET | Freq: Every day | ORAL | Status: DC

## 2012-01-25 NOTE — Progress Notes (Signed)
76 yowf never smoker with large hiatal hernia & recurrent asthmatic bronchitis , about twice/yr for many yrs. Lung parenchyma looks ok on Ct scans 6/08 & 12/10. PFTs nml in 12/09 & mild airway obstruction in 4/11.  First presented 3/09 . CT neck for thyroid imaging s/o mediastinal contour abnormal. Ct chest >> large hiatal hernia & BL upper lobe hazy , ground glass opacity.   April 28, 2009  GI wu ongoing with Dr [name] for Fe def anemia requiring transfusions, small bowel AVMs suspected.   July 21, 2009 cc Acute visit- c/o cough and wheezing >> augmentin helped, enalapril changed to diovan   September 02, 2009 12:23 PM  reviewed PFTs, c/o dyspnea, HB nml, wheezing +, does not desat on exertion.   January 25, 2010>>acute office visit. Complains of c.o cough with production with thick dark green, wheezing chest tightness, sob this am before taking neb. >>rx Avelox.     Acute OV 01/13/12 -- hx COPD/asthmatic bronchitis. She formally had freq exacerbations, but she has done quite well for the last year - every since she had her hiatal hernia addressed. She was exposed to dust, pollen last week >> significant worsening in congestion, cough, wheeze.  She is doing Duonebs 3x a day right now. She is back on enalapril. Her breathing and cough are close to baseline after the pred.  rec Please finish your Prednisone and azithromycin Take your DuoNebs on your regular schedule Always wear your mask when you mow the grass   01/25/2012 f/u ov/Wilkins Elpers never smoker cc no better > worse for 10 days started with sinus congestion with increasing cough and congestion with yellow mucus - at baseline maintained on tudorza and "prn" qvar plus chronic acei and "does fine with this until her copd flares"  Sleeping ok without nocturnal  or early am exacerbation  of respiratory  c/o's or need for noct saba. Also denies any obvious fluctuation of symptoms with weather or environmental changes or other aggravating or  alleviating factors except as outlined above   ROS  The following are not active complaints unless bolded sore throat, dysphagia, dental problems, itching, sneezing,  nasal congestion or excess/ purulent secretions, ear ache,   fever, chills, sweats, unintended wt loss, pleuritic or exertional cp, hemoptysis,  orthopnea pnd or leg swelling, presyncope, palpitations, heartburn, abdominal pain, anorexia, nausea, vomiting, diarrhea  or change in bowel or urinary habits, change in stools or urine, dysuria,hematuria,  rash, arthralgias, visual complaints, headache, numbness weakness or ataxia or problems with walking or coordination,  change in mood/affect or memory.       Filed Vitals:   01/13/12 1200  BP: 118/62  Pulse: 96  Temp: 98 F (36.7 C)   Gen: Pleasant, overwt, in no distress,  normal affect, nasal tone to voice  ENT: No lesions,  mouth clear,  oropharynx clear, no postnasal drip  Neck: No JVD, no TMG, insp and exp stridor  Lungs: No use of accessory muscles, referred UA noise,  Plus active wheeze  Cardiovascular: RRR, heart sounds normal, no murmur or gallops, no peripheral edema  Musculoskeletal: No deformities, no cyanosis or clubbing  Neuro: alert, non focal  Skin: Warm, no lesions or rashes   -

## 2012-01-25 NOTE — Assessment & Plan Note (Signed)
ACE inhibitors are problematic in  pts with airway complaints because  even experienced pulmonologists can't always distinguish ace effects from copd/asthma/pnds/ allergies etc.  By themselves they don't actually cause a problem, much like oxygen can't by itself start a fire, but they certainly serve as a powerful catalyst or enhancer for any "fire"  or inflammatory process in the upper airway, be it caused by an ET  tube or more commonly reflux (especially in the obese or pts with known GERD or who are on biphoshonates) or URI's, due to interference with bradykinin clearance.  The effects of acei on bradykinin levels occurs in 100% of pt's on acei (unless they surreptitiously stop the med!) but the classic cough is only reported in 5%.  This leaves 95% of pts on acei's  with a variety of syndromes including no identifiable symptom in most  vs non-specific symptoms that wax and wane depending on what other insult is occuring at the level of the upper airway (like gerd or sinus dz )  rx with benicar samples for now, would avoid acei indefinitely in this setting.

## 2012-01-25 NOTE — Patient Instructions (Addendum)
Start dulera 200 Take 2 puffs first thing in am and then another 2 puffs about 12 hours later.    stop all other inhalers and just use nebulizer up to every 4 hours if needed  Augmentin 875 mg twice daily x 10 days and yogurt for lunch  Prednisone 10 mg take  4 each am x 2 days,   2 each am x 2 days,  1 each am x2days and stop   Stop vasotec and start benicar 20 mg one daily (break in half if too strong and just take a half daily)  Please schedule a follow up office visit in 4 weeks, sooner if needed to see Dr Vassie Loll or Babette Relic or NP

## 2012-01-25 NOTE — Assessment & Plan Note (Signed)
DDX of  difficult airways managment all start with A and  include Adherence, Ace Inhibitors, Acid Reflux, Active Sinus Disease, Alpha 1 Antitripsin deficiency, Anxiety masquerading as Airways dz,  ABPA,  allergy(esp in young), Aspiration (esp in elderly), Adverse effects of DPI,  Active smokers, plus two Bs  = Bronchiectasis and Beta blocker use..and one C= CHF   Adherence is always the initial "prime suspect" and is a multilayered concern that requires a "trust but verify" approach in every patient - starting with knowing how to use medications, especially inhalers, correctly, keeping up with refills and understanding the fundamental difference between maintenance and prns vs those medications only taken for a very short course and then stopped and not refilled. The proper method of use, as well as anticipated side effects, of a metered-dose inhaler are discussed and demonstrated to the patient. Improved effectiveness after extensive coaching during this visit to a level of approximately  75% so start dulera 200 Take 2 puffs first thing in am and then another 2 puffs about 12 hours later.     ?acei effect > try off   ? Active / acute sinus dz > rx augmentin and f/u with sinus ct if not better.

## 2012-02-20 ENCOUNTER — Encounter: Payer: Self-pay | Admitting: Pulmonary Disease

## 2012-02-20 ENCOUNTER — Ambulatory Visit (INDEPENDENT_AMBULATORY_CARE_PROVIDER_SITE_OTHER): Payer: Medicare Other | Admitting: Pulmonary Disease

## 2012-02-20 VITALS — BP 130/68 | HR 80 | Temp 98.0°F | Ht 65.0 in | Wt 165.6 lb

## 2012-02-20 DIAGNOSIS — J209 Acute bronchitis, unspecified: Secondary | ICD-10-CM

## 2012-02-20 DIAGNOSIS — I10 Essential (primary) hypertension: Secondary | ICD-10-CM

## 2012-02-20 DIAGNOSIS — J45909 Unspecified asthma, uncomplicated: Secondary | ICD-10-CM

## 2012-02-20 DIAGNOSIS — K449 Diaphragmatic hernia without obstruction or gangrene: Secondary | ICD-10-CM

## 2012-02-20 MED ORDER — PREDNISONE 10 MG PO TABS: ORAL_TABLET | ORAL | Status: DC

## 2012-02-20 NOTE — Progress Notes (Signed)
  Subjective:    Patient ID: [name] [name], female    DOB: June 03, 1935, 76 y.o.   MRN: [id number]  HPI 28 yowf never smoker with large hiatal hernia & recurrent asthmatic bronchitis , about twice/yr for many yrs. Lung parenchyma looks ok on Ct scans 6/08 & 12/10. PFTs nml in 12/09 & mild airway obstruction in 4/11.  First presented 3/09 . CT neck for thyroid imaging s/o mediastinal contour abnormal. Ct chest >> large hiatal hernia & BL upper lobe hazy , ground glass opacity.  Dec, 2010  [name] [name])  Fe def anemia requiring transfusions, small bowel AVMs suspected.  Mar 2011 Acute visit- c/o cough and wheezing >> augmentin helped, enalapril changed to diovan   Apr 2011 PFTs, c/o dyspnea, HB nml, wheezing +, does not desat on exertion.  January 25, 2010>>acute office visit. Complains of c.o cough with production with thick dark green, wheezing chest tightness, sob this am before taking neb. >>rx Avelox.    02/20/2012 She did well 2012  Hernia surgery 1/13, remains on protonix Acute OV 01/13/12 -- after exposure to dust, pollen  >> pred, zpak, duonebs - no better on fu visit in 2 weeks -->> dulera, prednisone, benicar instead of enalapril (this had been restarted), augmentin Improved but back again 4 wks later with c/o Sinus pressure, nasal congestion, PND, cough w/ yellow-green phlem,  fever off and on, chills, slight chest tx off and on. no SOB, chest tx.  They are planning a trip to [facility] GERD is well controlled CXR 01/10/12 wnl  Review of Systems neg for any significant sore throat, dysphagia, itching, sneezing, nasal congestion or excess/ purulent secretions, fever, chills, sweats, unintended wt loss, pleuritic or exertional cp, hempoptysis, orthopnea pnd or change in chronic leg swelling. Also denies presyncope, palpitations, heartburn, abdominal pain, nausea, vomiting, diarrhea or change in bowel or urinary habits, dysuria,hematuria, rash, arthralgias, visual complaints, headache, numbness  weakness or ataxia.     Objective:   Physical Exam  Gen. Pleasant, well-nourished, in no distress ENT - no lesions, no post nasal drip Neck: No JVD, no thyromegaly, no carotid bruits Lungs: no use of accessory muscles, no dullness to percussion, clear without rales or rhonchi  Cardiovascular: Rhythm regular, heart sounds  normal, no murmurs or gallops, no peripheral edema Musculoskeletal: No deformities, no cyanosis or clubbing         Assessment & Plan:

## 2012-02-20 NOTE — Assessment & Plan Note (Signed)
zpak if worse since she is going on a trip

## 2012-02-20 NOTE — Assessment & Plan Note (Signed)
bp controlled on benicar

## 2012-02-20 NOTE — Assessment & Plan Note (Signed)
Start on ZYRTEC-D daily  Albuterol MDI sample - use 2 puffs every 6h as needed only for wheezing OK to use mucinex DM in daytime & DELSYM cough syrup 10 ml at night Prednisone 10 mg tabs -start taking if worse Take 2 tabs  daily with food x 7 days, then 1 tabs daily x 7 days then stop. #21 Stay on dulera

## 2012-02-20 NOTE — Patient Instructions (Addendum)
Start on ZYRTEC-D daily  Albuterol MDI sample - use 2 puffs every 6h as needed only for wheezing OK to use mucinex DM in daytime & DELSYM cough syrup 10 ml at night Prednisone 10 mg tabs -start taking if worse Take 2 tabs  daily with food x 7 days, then 1 tabs daily x 7 days then stop. #21 Stay on dulera  

## 2012-02-22 ENCOUNTER — Ambulatory Visit: Payer: Medicare Other | Admitting: Adult Health

## 2012-04-02 ENCOUNTER — Ambulatory Visit (INDEPENDENT_AMBULATORY_CARE_PROVIDER_SITE_OTHER): Payer: Medicare Other | Admitting: Adult Health

## 2012-04-02 ENCOUNTER — Encounter: Payer: Self-pay | Admitting: Adult Health

## 2012-04-02 VITALS — BP 110/68 | HR 82 | Ht 65.0 in | Wt 166.6 lb

## 2012-04-02 DIAGNOSIS — J45909 Unspecified asthma, uncomplicated: Secondary | ICD-10-CM

## 2012-04-02 MED ORDER — PREDNISONE 10 MG PO TABS: ORAL_TABLET | ORAL | Status: DC

## 2012-04-02 MED ORDER — CEFDINIR 300 MG PO CAPS: 300.0000 mg | ORAL_CAPSULE | Freq: Two times a day (BID) | ORAL | Status: DC

## 2012-04-02 NOTE — Assessment & Plan Note (Signed)
Exacerbation   Plan Omnicef 300mg  Twice daily  For 7 days  Mucinex Twice daily  As needed  Cough/congestion  Prednisone taper over next week.  Fluids and rest  Please contact office for sooner follow up if symptoms do not improve or worsen or seek emergency care  follow up Dr. Vassie Loll  In 3 months and As needed

## 2012-04-02 NOTE — Patient Instructions (Addendum)
Omnicef 300mg  Twice daily  For 7 days  Mucinex Twice daily  As needed  Cough/congestion  Prednisone taper over next week.  Fluids and rest  Please contact office for sooner follow up if symptoms do not improve or worsen or seek emergency care  follow up Dr. Vassie Loll  In 3 months and As needed

## 2012-04-02 NOTE — Progress Notes (Signed)
  Subjective:    Patient ID: [name] [name], female    DOB: June 05, 1935, 76 y.o.   MRN: [id number]  HPI  46 yowf never smoker with large hiatal hernia & recurrent asthmatic bronchitis , about twice/yr for many yrs. Lung parenchyma looks ok on Ct scans 6/08 & 12/10. PFTs nml in 12/09 & mild airway obstruction in 4/11.  First presented 3/09 . CT neck for thyroid imaging s/o mediastinal contour abnormal. Ct chest >> large hiatal hernia & BL upper lobe hazy , ground glass opacity.  Dec, 2010  [name])  Fe def anemia requiring transfusions, small bowel AVMs suspected.  Mar 2011 Acute visit- c/o cough and wheezing >> augmentin helped, enalapril changed to diovan   Apr 2011 PFTs, c/o dyspnea, HB nml, wheezing +, does not desat on exertion.  January 25, 2010>>acute office visit. Complains of c.o cough with production with thick dark green, wheezing chest tightness, sob this am before taking neb. >>rx Avelox.    02/20/2012 She did well 2012  Hernia surgery 1/13, remains on protonix Acute OV 01/13/12 -- after exposure to dust, pollen  >> pred, zpak, duonebs - no better on fu visit in 2 weeks -->> dulera, prednisone, benicar instead of enalapril (this had been restarted), augmentin Improved but back again 4 wks later with c/o Sinus pressure, nasal congestion, PND, cough w/ yellow-green phlem,  fever off and on, chills, slight chest tx off and on. no SOB, chest tx.  They are planning a trip to [facility] [place] GERD is well controlled CXR 01/10/12 wnl >>steroid taper   04/02/2012 Follow up  Patient returns for a one-month followup Says she's been doing well up until last week, when she developed increased cough, productive, with thick, yellow green mucus, nasal congestion, and drainage, She's been using Mucinex and Delsym without much relief. Over-the-counter. She denies any fever, hemoptysis, chest pain, edema.   Review of Systems Constitutional:   No  weight loss, night sweats,  Fevers, chills, fatigue,  or  lassitude.  HEENT:   No headaches,  Difficulty swallowing,  Tooth/dental problems, or  Sore throat,                No sneezing, itching, ear ache,  +nasal congestion, post nasal drip,   CV:  No chest pain,  Orthopnea, PND, swelling in lower extremities, anasarca, dizziness, palpitations, syncope.   GI  No heartburn, indigestion, abdominal pain, nausea, vomiting, diarrhea, change in bowel habits, loss of appetite, bloody stools.   Resp:  No coughing up of blood.     No chest wall deformity  Skin: no rash or lesions.  GU: no dysuria, change in color of urine, no urgency or frequency.  No flank pain, no hematuria   MS:  No joint pain or swelling.  No decreased range of motion.  No back pain.  Psych:  No change in mood or affect. No depression or anxiety.  No memory loss.         Objective:   Physical Exam   Gen. Pleasant, well-nourished, in no distress ENT - no lesions, no post nasal drip Neck: No JVD, no thyromegaly, no carotid bruits Lungs: no use of accessory muscles, no dullness to percussion, exp wheezing bilaterally  Cardiovascular: Rhythm regular, heart sounds  normal, no murmurs or gallops, no peripheral edema Musculoskeletal: No deformities, no cyanosis or clubbing         Assessment & Plan:

## 2012-06-04 ENCOUNTER — Emergency Department (HOSPITAL_COMMUNITY): Payer: Medicare Other

## 2012-06-04 ENCOUNTER — Emergency Department (HOSPITAL_COMMUNITY)
Admission: EM | Admit: 2012-06-04 | Discharge: 2012-06-04 | Disposition: A | Discharge status: Home / Self Care | Payer: Medicare Other | Attending: Emergency Medicine | Admitting: Emergency Medicine

## 2012-06-04 ENCOUNTER — Encounter (HOSPITAL_COMMUNITY): Payer: Self-pay | Admitting: Emergency Medicine

## 2012-06-04 DIAGNOSIS — Z79899 Other long term (current) drug therapy: Secondary | ICD-10-CM | POA: Insufficient documentation

## 2012-06-04 DIAGNOSIS — Z8719 Personal history of other diseases of the digestive system: Secondary | ICD-10-CM | POA: Insufficient documentation

## 2012-06-04 DIAGNOSIS — E785 Hyperlipidemia, unspecified: Secondary | ICD-10-CM | POA: Insufficient documentation

## 2012-06-04 DIAGNOSIS — K219 Gastro-esophageal reflux disease without esophagitis: Secondary | ICD-10-CM | POA: Insufficient documentation

## 2012-06-04 DIAGNOSIS — Z862 Personal history of diseases of the blood and blood-forming organs and certain disorders involving the immune mechanism: Secondary | ICD-10-CM | POA: Insufficient documentation

## 2012-06-04 DIAGNOSIS — I2510 Atherosclerotic heart disease of native coronary artery without angina pectoris: Secondary | ICD-10-CM | POA: Insufficient documentation

## 2012-06-04 DIAGNOSIS — IMO0002 Reserved for concepts with insufficient information to code with codable children: Secondary | ICD-10-CM | POA: Insufficient documentation

## 2012-06-04 DIAGNOSIS — Z8701 Personal history of pneumonia (recurrent): Secondary | ICD-10-CM | POA: Insufficient documentation

## 2012-06-04 DIAGNOSIS — Z90710 Acquired absence of both cervix and uterus: Secondary | ICD-10-CM | POA: Insufficient documentation

## 2012-06-04 DIAGNOSIS — I10 Essential (primary) hypertension: Secondary | ICD-10-CM | POA: Insufficient documentation

## 2012-06-04 DIAGNOSIS — J45909 Unspecified asthma, uncomplicated: Secondary | ICD-10-CM | POA: Insufficient documentation

## 2012-06-04 DIAGNOSIS — M5416 Radiculopathy, lumbar region: Secondary | ICD-10-CM

## 2012-06-04 DIAGNOSIS — Z8709 Personal history of other diseases of the respiratory system: Secondary | ICD-10-CM | POA: Insufficient documentation

## 2012-06-04 MED ORDER — HYDROCODONE-ACETAMINOPHEN 5-325 MG PO TABS: 1.0000 | ORAL_TABLET | Freq: Four times a day (QID) | ORAL | Status: DC | PRN

## 2012-06-04 MED ORDER — PREDNISONE 10 MG PO TABS: ORAL_TABLET | ORAL | Status: DC

## 2012-06-04 MED ORDER — HYDROCODONE-ACETAMINOPHEN 5-325 MG PO TABS
2.0000 | ORAL_TABLET | Freq: Once | ORAL | Status: AC
  Administered 2012-06-04: 2 via ORAL
  Filled 2012-06-04: qty 2

## 2012-06-04 NOTE — ED Provider Notes (Addendum)
History     CSN: [id number]  Arrival date & time 06/04/12  0425   First MD Initiated Contact with Patient 06/04/12 0510      Chief Complaint  Patient presents with  . Hip Pain    (Consider location/radiation/quality/duration/timing/severity/associated sxs/prior treatment) HPI This is a 77 year old female with a history of 2 back surgeries. She is here with a two-day history of an aching pain in her right hip radiating down the side of her right thigh to her right knee. It is worse with weightbearing and ambulation. There is no associated numbness or weakness. She denies injury. The onset was gradual. The pain is moderate to severe. She has had similar pain in the past, treated with steroids and analgesics.  Past Medical History  Diagnosis Date  . CAD (coronary artery disease)     LHC 2003 with 40% pLAD, 30% mLAD, 100% D1 (moderate vessel), 100%  D2 (small vessel), 95% mid small OM2.  Pt had PTCA to D1;  D2 and OM2 small vessels and tx medically;  Last Myoview (5/09):  EF 66%, anteroseptal/apical infarct with minimal peri-infarct ischemia.  No significant change from 11/06. Pt managed medically  . Recurrent aspiration bronchitis/pneumonia     PFTs 12/09 FVC 97%, FEV1 101%, ratio 77%, TLC 109%. No significant obstruction or restriction.   . Hypertension   . Hyperlipidemia   . Esophageal stricture   . Diverticulosis of colon   . Gastroesophageal reflux disease with hiatal hernia   . Iron deficiency anemia   . Gastritis   . GERD (gastroesophageal reflux disease)   . Hiatal hernia   . Asthma   . Blood transfusion   . Cough   . Wheezing   . Weakness   . Trouble swallowing   . Abdominal pain   . Hiatal hernia   . Shortness of breath     Past Surgical History  Procedure Date  . Back surgery   . Cholecystectomy   . Bladder repair   . Hemorrhoid surgery   . Cholecystectomy     Patient unsure of date.  [name] Appendectomy     patient unsure of date  . Total abdominal  hysterectomy 1975    partial  . Coronary angioplasty 11/16/2001  . Eye surgery 11/2000    bilateral cataracts with lens implant  . Hiatal hernia repair 06/03/2011    Procedure: LAPAROSCOPIC REPAIR OF HIATAL HERNIA;  Surgeon: Ernestene Mention, MD;  Location: [facility];  Service: General;  Laterality: N/A;  . Laparoscopic nissen fundoplication 06/03/2011    Procedure: LAPAROSCOPIC NISSEN FUNDOPLICATION;  Surgeon: Ernestene Mention, MD;  Location: [facility];  Service: General;  Laterality: N/A;    Family History  Problem Relation Age of Onset  . Cancer Mother     colon   . Colon cancer Mother 52  . Heart disease Father 58    heart attack    History  Substance Use Topics  . Smoking status: Never Smoker   . Smokeless tobacco: Never Used  . Alcohol Use: No    OB History    Grav Para Term Preterm Abortions TAB SAB Ect Mult Living                  Review of Systems  All other systems reviewed and are negative.    Allergies  Aspartame and Daliresp  Home Medications   Current Outpatient Rx  Name  Route  Sig  Dispense  Refill  . ALPRAZOLAM 0.5 MG PO TABS  Oral   Take 0.5 mg by mouth at bedtime as needed. Sleep          . AMLODIPINE BESYLATE 10 MG PO TABS   Oral   Take 10 mg by mouth daily with breakfast.          . CALCIUM CARBONATE ANTACID 500 MG PO CHEW   Oral   Chew 1 tablet by mouth daily as needed.          [name] CEFDINIR 300 MG PO CAPS   Oral   Take 1 capsule (300 mg total) by mouth 2 (two) times daily.   14 capsule   0   . VITAMIN D 1000 UNITS PO CAPS   Oral   Take 6,000 Units by mouth daily.          [name] CORAL CALCIUM PO   Oral   Take 1 tablet by mouth daily.         [name] VITAMIN B 12 PO   Oral   Take 1 tablet by mouth daily.          [name] MUCINEX DM PO   Oral   Take by mouth as needed. For congestion or cough         . HYDROCHLOROTHIAZIDE 25 MG PO TABS   Oral   Take 25 mg by mouth daily with breakfast.          . IPRATROPIUM-ALBUTEROL  0.5-2.5 (3) MG/3ML IN SOLN   Nebulization   Take 3 mLs by nebulization 4 (four) times daily as needed.    360 mL      . MOMETASONE FURO-FORMOTEROL FUM 200-5 MCG/ACT IN AERO      Take 2 puffs first thing in am and then another 2 puffs about 12 hours later.         [name] OLMESARTAN MEDOXOMIL 20 MG PO TABS   Oral   Take 1 tablet (20 mg total) by mouth daily.         [name] PANTOPRAZOLE SODIUM 40 MG PO TBEC   Oral   Take 40 mg by mouth 2 (two) times daily.          [name] PRAVASTATIN SODIUM 40 MG PO TABS   Oral   Take 40 mg by mouth at bedtime.          [name] PREDNISONE 10 MG PO TABS      4 tabs for 2 days, then 3 tabs for 2 days, 2 tabs for 2 days, then 1 tab for 2 days, then stop   20 tablet   0     BP 125/77  Temp 98.4 F (36.9 C) (Oral)  Resp 18  SpO2 95%  Physical Exam General: Well-developed, well-nourished female in no acute distress; appearance consistent with age of record HENT: normocephalic, atraumatic Eyes: pupils equal round and reactive to light; extraocular muscles intact Neck: supple Heart: regular rate and rhythm Lungs: Normal respiratory effort and excursion Abdomen: soft; nondistended Back: Right sacroiliac tenderness; no sciatic notch tenderness Extremities: No deformity; full range of motion; pulses normal; no edema; no pain on passive or active movement of the right hip Neurologic: Awake, alert and oriented; motor function intact in all extremities and symmetric; no facial droop; sensation intact and equal in the lower extremities Skin: Warm and dry Psychiatric: Normal mood and affect    ED Course  Procedures (including critical care time)     MDM  Nursing notes and vitals signs, including pulse oximetry, reviewed.  Summary of  this visit's results, reviewed by myself:  Imaging Studies: Dg Hip Complete Right  06/04/2012  *RADIOLOGY REPORT*  Clinical Data: Right hip pain  RIGHT HIP - COMPLETE 2+ VIEW  Comparison: 03/19/2008  Findings: No  displaced fracture identified.  No dislocation.  No aggressive osseous lesions. Lower lumbar degenerative change. Overlying soft tissues unremarkable.  IMPRESSION: No displaced fracture identified. If clinical concern for fracture persists, MRI follow-up recommended (which has increased sensitivity for nondisplaced fracture).   Original Report Authenticated By: Jearld Lesch, M.D.             Hanley Seamen, MD 06/04/12 1610  Hanley Seamen, MD 06/04/12 [phone]

## 2012-06-04 NOTE — ED Notes (Signed)
PT. REPORTS RIGHT HIP PAIN ONSET LAST Saturday , DENIES INJURY OR FALL , PAIN WORSE WHEN AMBULATING / WEIGHT BEARING , STATES HISTORY OF ARTHRITIS.

## 2012-06-13 ENCOUNTER — Emergency Department (HOSPITAL_COMMUNITY)
Admission: EM | Admit: 2012-06-13 | Discharge: 2012-06-13 | Disposition: A | Discharge status: Home / Self Care | Payer: Medicare Other | Attending: Emergency Medicine | Admitting: Emergency Medicine

## 2012-06-13 ENCOUNTER — Emergency Department (HOSPITAL_COMMUNITY): Payer: Medicare Other

## 2012-06-13 ENCOUNTER — Encounter (HOSPITAL_COMMUNITY): Payer: Self-pay | Admitting: Adult Health

## 2012-06-13 DIAGNOSIS — M25559 Pain in unspecified hip: Secondary | ICD-10-CM | POA: Insufficient documentation

## 2012-06-13 DIAGNOSIS — I10 Essential (primary) hypertension: Secondary | ICD-10-CM | POA: Insufficient documentation

## 2012-06-13 DIAGNOSIS — E785 Hyperlipidemia, unspecified: Secondary | ICD-10-CM | POA: Insufficient documentation

## 2012-06-13 DIAGNOSIS — I2510 Atherosclerotic heart disease of native coronary artery without angina pectoris: Secondary | ICD-10-CM | POA: Insufficient documentation

## 2012-06-13 DIAGNOSIS — Z862 Personal history of diseases of the blood and blood-forming organs and certain disorders involving the immune mechanism: Secondary | ICD-10-CM | POA: Insufficient documentation

## 2012-06-13 DIAGNOSIS — IMO0002 Reserved for concepts with insufficient information to code with codable children: Secondary | ICD-10-CM | POA: Insufficient documentation

## 2012-06-13 DIAGNOSIS — Z79899 Other long term (current) drug therapy: Secondary | ICD-10-CM | POA: Insufficient documentation

## 2012-06-13 DIAGNOSIS — Z9889 Other specified postprocedural states: Secondary | ICD-10-CM | POA: Insufficient documentation

## 2012-06-13 DIAGNOSIS — M5430 Sciatica, unspecified side: Secondary | ICD-10-CM | POA: Insufficient documentation

## 2012-06-13 DIAGNOSIS — Z8701 Personal history of pneumonia (recurrent): Secondary | ICD-10-CM | POA: Insufficient documentation

## 2012-06-13 DIAGNOSIS — J45909 Unspecified asthma, uncomplicated: Secondary | ICD-10-CM | POA: Insufficient documentation

## 2012-06-13 DIAGNOSIS — Z8719 Personal history of other diseases of the digestive system: Secondary | ICD-10-CM | POA: Insufficient documentation

## 2012-06-13 DIAGNOSIS — Z8709 Personal history of other diseases of the respiratory system: Secondary | ICD-10-CM | POA: Insufficient documentation

## 2012-06-13 DIAGNOSIS — K219 Gastro-esophageal reflux disease without esophagitis: Secondary | ICD-10-CM | POA: Insufficient documentation

## 2012-06-13 MED ORDER — PREDNISONE 20 MG PO TABS: 40.0000 mg | ORAL_TABLET | Freq: Every day | ORAL | Status: DC

## 2012-06-13 MED ORDER — NAPROXEN 500 MG PO TABS: 500.0000 mg | ORAL_TABLET | Freq: Two times a day (BID) | ORAL | Status: DC

## 2012-06-13 MED ORDER — HYDROCODONE-ACETAMINOPHEN 5-325 MG PO TABS
2.0000 | ORAL_TABLET | Freq: Once | ORAL | Status: AC
  Administered 2012-06-13: 2 via ORAL
  Filled 2012-06-13: qty 2

## 2012-06-13 MED ORDER — HYDROCODONE-ACETAMINOPHEN 5-325 MG PO TABS: 2.0000 | ORAL_TABLET | ORAL | Status: DC | PRN

## 2012-06-13 NOTE — ED Provider Notes (Signed)
History   This chart was scribed for non-physician practitioner working with Gilda Crease, by Gerlean Ren, ED Scribe. This patient was seen in room TR10C/TR10C and the patient's care was started at 9:55 PM.    CSN: [id number]  Arrival date & time 06/13/12  1727   First MD Initiated Contact with Patient 06/13/12 2052      Chief Complaint  Patient presents with  . Knee Pain     The history is provided by the patient and medical records. No language interpreter was used.  [name] [name] is a 77 y.o. female with h/o CAD, HTN, and sciatica who presents to the Emergency Department complaining of constant, non-radiating, moderate, aching right-side back pain, right hip pain and right knee pain since falling one week ago that is worsened when bearing weight.  Similar to sciatic pain.  Pt states that pain has been mildly reduced by pain medication given here.  Pt denies tobacco and alcohol use. Movement makes it worse and nothing makes it better.  She has taken Vicodin in the past that has helped.  She states she has an upcoming appointment with her orthopedist.  Past Medical History  Diagnosis Date  . CAD (coronary artery disease)     LHC 2003 with 40% pLAD, 30% mLAD, 100% D1 (moderate vessel), 100%  D2 (small vessel), 95% mid small OM2.  Pt had PTCA to D1;  D2 and OM2 small vessels and tx medically;  Last Myoview (5/09):  EF 66%, anteroseptal/apical infarct with minimal peri-infarct ischemia.  No significant change from 11/06. Pt managed medically  . Recurrent aspiration bronchitis/pneumonia     PFTs 12/09 FVC 97%, FEV1 101%, ratio 77%, TLC 109%. No significant obstruction or restriction.   . Hypertension   . Hyperlipidemia   . Esophageal stricture   . Diverticulosis of colon   . Gastroesophageal reflux disease with hiatal hernia   . Iron deficiency anemia   . Gastritis   . GERD (gastroesophageal reflux disease)   . Hiatal hernia   . Asthma   . Blood transfusion   . Cough   .  Wheezing   . Weakness   . Trouble swallowing   . Abdominal pain   . Hiatal hernia   . Shortness of breath     Past Surgical History  Procedure Date  . Back surgery   . Cholecystectomy   . Bladder repair   . Hemorrhoid surgery   . Cholecystectomy     Patient unsure of date.  [name] Appendectomy     patient unsure of date  . Total abdominal hysterectomy 1975    partial  . Coronary angioplasty 11/16/2001  . Eye surgery 11/2000    bilateral cataracts with lens implant  . Hiatal hernia repair 06/03/2011    Procedure: LAPAROSCOPIC REPAIR OF HIATAL HERNIA;  Surgeon: Ernestene Mention, MD;  Location: [facility];  Service: General;  Laterality: N/A;  . Laparoscopic nissen fundoplication 06/03/2011    Procedure: LAPAROSCOPIC NISSEN FUNDOPLICATION;  Surgeon: Ernestene Mention, MD;  Location: [facility];  Service: General;  Laterality: N/A;    Family History  Problem Relation Age of Onset  . Cancer Mother     colon   . Colon cancer Mother 32  . Heart disease Father 22    heart attack    History  Substance Use Topics  . Smoking status: Never Smoker   . Smokeless tobacco: Never Used  . Alcohol Use: No    No OB history provided.  Review of Systems  Constitutional: Negative for fever and fatigue.  HENT: Negative for neck pain and neck stiffness.   Respiratory: Negative for chest tightness and shortness of breath.   Cardiovascular: Negative for chest pain.  Gastrointestinal: Negative for nausea, vomiting, abdominal pain and diarrhea.  Genitourinary: Negative for dysuria, urgency, frequency and hematuria.  Musculoskeletal: Positive for back pain and gait problem. Negative for joint swelling.       Knee pain, hip pain  Skin: Negative for rash.  Neurological: Negative for weakness, light-headedness, numbness and headaches.  All other systems reviewed and are negative.    Allergies  Aspartame and Daliresp  Home Medications   Current Outpatient Rx  Name  Route  Sig  Dispense  Refill  .  ALPRAZOLAM 0.5 MG PO TABS   Oral   Take 0.5 mg by mouth at bedtime as needed. For sleep         . AMLODIPINE BESYLATE 10 MG PO TABS   Oral   Take 10 mg by mouth daily with breakfast.          . CALCIUM CARBONATE ANTACID 500 MG PO CHEW   Oral   Chew 1 tablet by mouth daily as needed. For heartburn         . VITAMIN D 1000 UNITS PO CAPS   Oral   Take 6,000 Units by mouth daily.          [name] CORAL CALCIUM PO   Oral   Take 1 tablet by mouth daily.         [name] VITAMIN B 12 PO   Oral   Take 1 tablet by mouth daily.          [name] GABAPENTIN 300 MG PO CAPS   Oral   Take 300 mg by mouth at bedtime.         [name] HYDROCHLOROTHIAZIDE 25 MG PO TABS   Oral   Take 25 mg by mouth daily with breakfast.          . HYDROCODONE-ACETAMINOPHEN 10-325 MG PO TABS   Oral   Take 0.5-1 tablets by mouth 3 (three) times daily as needed. For pain         . IPRATROPIUM-ALBUTEROL 0.5-2.5 (3) MG/3ML IN SOLN   Nebulization   Take 3 mLs by nebulization 4 (four) times daily as needed. For shortness of breath   360 mL      . MOMETASONE FURO-FORMOTEROL FUM 200-5 MCG/ACT IN AERO   Inhalation   Inhale 2 puffs into the lungs 2 (two) times daily.         [name] OLMESARTAN MEDOXOMIL 20 MG PO TABS   Oral   Take 1 tablet (20 mg total) by mouth daily.         [name] PANTOPRAZOLE SODIUM 40 MG PO TBEC   Oral   Take 40 mg by mouth 2 (two) times daily.          [name] PRAVASTATIN SODIUM 40 MG PO TABS   Oral   Take 40 mg by mouth at bedtime.          [name] HYDROCODONE-ACETAMINOPHEN 5-325 MG PO TABS   Oral   Take 2 tablets by mouth every 4 (four) hours as needed for pain.   20 tablet   0   . NAPROXEN 500 MG PO TABS   Oral   Take 1 tablet (500 mg total) by mouth 2 (two) times daily with a meal.   30 tablet   0   .  PREDNISONE 20 MG PO TABS   Oral   Take 2 tablets (40 mg total) by mouth daily.   10 tablet   0     BP 104/73  Pulse 95  Temp 99.1 F (37.3 C) (Oral)  Resp 20  SpO2  98%  Physical Exam  Nursing note and vitals reviewed. Constitutional: She is oriented to person, place, and time. She appears well-developed and well-nourished. No distress.  HENT:  Head: Normocephalic and atraumatic.  Mouth/Throat: Oropharynx is clear and moist. No oropharyngeal exudate.  Eyes: Conjunctivae normal are normal. Pupils are equal, round, and reactive to light. No scleral icterus.  Neck: Normal range of motion. Neck supple.       Full ROM without pain  Cardiovascular: Normal rate, regular rhythm, normal heart sounds and intact distal pulses.  Exam reveals no gallop and no friction rub.   No murmur heard. Pulmonary/Chest: Effort normal and breath sounds normal. No respiratory distress. She has no wheezes. She has no rales. She exhibits no tenderness.  Musculoskeletal: Normal range of motion.       Right hip: She exhibits normal range of motion, normal strength, no tenderness, no bony tenderness, no swelling, no crepitus, no deformity and no laceration.       Right knee: She exhibits normal range of motion, no swelling, no effusion, no ecchymosis, no deformity, no laceration, no erythema, normal alignment, no LCL laxity, normal patellar mobility, no bony tenderness, normal meniscus and no MCL laxity. no tenderness found.       Lumbar back: She exhibits tenderness, pain and spasm. She exhibits normal range of motion, no swelling, no deformity and no laceration.       Legs:      Right lumbar paraspinal tenderness Pain reproducible with palpation of the right buttocks and right paraspinal lumbar muscles  Lymphadenopathy:    She has no cervical adenopathy.  Neurological: She is alert and oriented to person, place, and time. She has normal reflexes. No cranial nerve deficit. She exhibits normal muscle tone. Coordination normal.       Speech is clear and goal oriented, follows commands Normal strength in upper and lower extremities bilaterally including dorsiflexion and plantar flexion,  strong and equal grip strength Sensation normal to light and sharp touch Moves extremities without ataxia, coordination intact Normal gait Normal balance   Skin: Skin is warm and dry. No rash noted. She is not diaphoretic. No erythema.  Psychiatric: She has a normal mood and affect. Her behavior is normal.    ED Course  Procedures (including critical care time) DIAGNOSTIC STUDIES: Oxygen Saturation is 98% on room air, normal by my interpretation.    COORDINATION OF CARE: 10:00 PM- Informed pt I will give her anti-inflammatories and cortisone for pain.  Informed pt she needs to follow-up with orthopedist.  Dg Hip Complete Right  06/13/2012  *RADIOLOGY REPORT*  Clinical Data: Right hip pain.  RIGHT HIP - COMPLETE 2+ VIEW  Comparison: 06/04/2012.  Findings: No fracture or dislocation.  Mild subchondral sclerosis in the hips bilaterally.  Mild superior joint space narrowing in the left hip.  IMPRESSION:  1.  No acute findings. 2.  Mild degenerative changes in the left hip.   Original Report Authenticated By: Leanna Battles, M.D.    Dg Knee Complete 4 Views Right  06/13/2012  *RADIOLOGY REPORT*  Clinical Data: Right knee pain.  RIGHT KNEE - COMPLETE 4+ VIEW  Comparison: No joint effusion or fracture.  Chondrocalcinosis is seen in the lateral compartment.  Findings: No joint effusion or fracture.  Chondrocalcinosis is seen in the lateral compartment.  IMPRESSION: 1.  No acute findings. 2.   Chondrocalcinosis in the lateral compartment of the knee.   Original Report Authenticated By: Leanna Battles, M.D.      1. Sciatica       MDM  Lara Mulch presents with back pain, right hip pain and right knee pain. Patient's symptoms consistent with sciatica.  Patient with history of fall an x-ray of hip with mild degenerative changes and x-ray of knee with chondrocalcinosis but no acute findings on either.  Patient also with back pain.  No neurological deficits and normal neuro exam.  Patient can walk  but states is painful.  No loss of bowel or bladder control.  No concern for cauda equina.  No fever, night sweats, weight loss, h/o cancer, IVDU.  RICE protocol and pain medicine indicated and discussed with patient.    1. Medications: Vicodin, Naprosyn, prednisone, usual home medications  2. Treatment: rest, drink plenty of fluids, gentle stretching as discussed, use ice  3. Follow Up: Please followup with your primary doctor for discussion of your diagnoses and further evaluation after today's visit; followup with your orthopedist   I personally performed the services described in this documentation, which was scribed in my presence. The recorded information has been reviewed and is accurate.    Dahlia Client Feliza Diven, PA-C 06/13/12 2239

## 2012-06-13 NOTE — ED Notes (Addendum)
Presents with right knee and right hip pain that began yesterday after a fall while walking out of a restaurant. Landed on right knee. C/o worse knee pain and shooting hip pain. Pain is worse with movement. CMS intact. Taking vicodin with no relief

## 2012-06-13 NOTE — ED Notes (Addendum)
Pt had right knee and right hip pain onset 1 week ago, cannot remember any significant event that could have caused pain. Pt fell yesterday and aggravated prior pain to hip and knee. Able to move all extremitites, pain increases when weight is applied to knee. A&Ox4, ambulatory, stable.

## 2012-06-13 NOTE — ED Notes (Signed)
PA at bedside.

## 2012-06-14 NOTE — ED Provider Notes (Signed)
Medical screening examination/treatment/procedure(s) were performed by non-physician practitioner and as supervising physician I was immediately available for consultation/collaboration.   Gilda Crease, MD 06/14/12 1100

## 2012-06-19 ENCOUNTER — Emergency Department (HOSPITAL_COMMUNITY): Payer: Medicare Other

## 2012-06-19 ENCOUNTER — Observation Stay (HOSPITAL_COMMUNITY)
Admission: EM | Admit: 2012-06-19 | Discharge: 2012-06-22 | Disposition: A | Discharge status: Home / Self Care | Payer: Medicare Other | Attending: Internal Medicine | Admitting: Internal Medicine

## 2012-06-19 ENCOUNTER — Encounter (HOSPITAL_COMMUNITY): Payer: Self-pay | Admitting: Adult Health

## 2012-06-19 DIAGNOSIS — J45909 Unspecified asthma, uncomplicated: Secondary | ICD-10-CM | POA: Insufficient documentation

## 2012-06-19 DIAGNOSIS — M546 Pain in thoracic spine: Secondary | ICD-10-CM | POA: Diagnosis present

## 2012-06-19 DIAGNOSIS — M5440 Lumbago with sciatica, unspecified side: Secondary | ICD-10-CM

## 2012-06-19 DIAGNOSIS — I2510 Atherosclerotic heart disease of native coronary artery without angina pectoris: Secondary | ICD-10-CM | POA: Insufficient documentation

## 2012-06-19 DIAGNOSIS — I10 Essential (primary) hypertension: Secondary | ICD-10-CM | POA: Insufficient documentation

## 2012-06-19 DIAGNOSIS — E785 Hyperlipidemia, unspecified: Secondary | ICD-10-CM | POA: Insufficient documentation

## 2012-06-19 DIAGNOSIS — M5430 Sciatica, unspecified side: Principal | ICD-10-CM | POA: Insufficient documentation

## 2012-06-19 DIAGNOSIS — M549 Dorsalgia, unspecified: Secondary | ICD-10-CM | POA: Insufficient documentation

## 2012-06-19 DIAGNOSIS — M25559 Pain in unspecified hip: Secondary | ICD-10-CM | POA: Insufficient documentation

## 2012-06-19 DIAGNOSIS — W19XXXA Unspecified fall, initial encounter: Secondary | ICD-10-CM | POA: Insufficient documentation

## 2012-06-19 DIAGNOSIS — J4520 Mild intermittent asthma, uncomplicated: Secondary | ICD-10-CM | POA: Diagnosis present

## 2012-06-19 MED ORDER — MORPHINE SULFATE 4 MG/ML IJ SOLN
4.0000 mg | Freq: Once | INTRAMUSCULAR | Status: AC
  Administered 2012-06-19: 4 mg via INTRAMUSCULAR
  Filled 2012-06-19: qty 1

## 2012-06-19 MED ORDER — KETOROLAC TROMETHAMINE 30 MG/ML IJ SOLN
30.0000 mg | Freq: Once | INTRAMUSCULAR | Status: AC
  Administered 2012-06-19: 30 mg via INTRAMUSCULAR
  Filled 2012-06-19: qty 1

## 2012-06-19 NOTE — ED Notes (Addendum)
Presents with right back, hip, groin and leg pain that began Saturday after a fall. She has been seen here for the same but was not seen after fall Saturday. Pain has gradually worsened over the last 2 days and is the worst the the back and hip apin have ever been. Pt is unable to sit with out severe pain. She has been ambulatory at home until this afternoon. CMS intact.  Took 2 hydrocodone at 21:15 with no relief.

## 2012-06-19 NOTE — ED Provider Notes (Addendum)
History     CSN: [id number]  Arrival date & time 06/19/12  2154   First MD Initiated Contact with Patient 06/19/12 2220      Chief Complaint  Patient presents with  . Hip Pain    (Consider location/radiation/quality/duration/timing/severity/associated sxs/prior treatment) Patient is a 77 y.o. female presenting with hip pain. The history is provided by the patient.  Hip Pain This is a chronic problem. The current episode started more than 1 week ago. The problem occurs constantly. The symptoms are aggravated by twisting. Nothing relieves the symptoms. She has tried nothing for the symptoms.    Past Medical History  Diagnosis Date  . CAD (coronary artery disease)     LHC 2003 with 40% pLAD, 30% mLAD, 100% D1 (moderate vessel), 100%  D2 (small vessel), 95% mid small OM2.  Pt had PTCA to D1;  D2 and OM2 small vessels and tx medically;  Last Myoview (5/09):  EF 66%, anteroseptal/apical infarct with minimal peri-infarct ischemia.  No significant change from 11/06. Pt managed medically  . Recurrent aspiration bronchitis/pneumonia     PFTs 12/09 FVC 97%, FEV1 101%, ratio 77%, TLC 109%. No significant obstruction or restriction.   . Hypertension   . Hyperlipidemia   . Esophageal stricture   . Diverticulosis of colon   . Gastroesophageal reflux disease with hiatal hernia   . Iron deficiency anemia   . Gastritis   . GERD (gastroesophageal reflux disease)   . Hiatal hernia   . Asthma   . Blood transfusion   . Cough   . Wheezing   . Weakness   . Trouble swallowing   . Abdominal pain   . Hiatal hernia   . Shortness of breath     Past Surgical History  Procedure Laterality Date  . Back surgery    . Cholecystectomy    . Bladder repair    . Hemorrhoid surgery    . Cholecystectomy      Patient unsure of date.  [name] Appendectomy      patient unsure of date  . Total abdominal hysterectomy  1975    partial  . Coronary angioplasty  11/16/2001  . Eye surgery  11/2000    bilateral  cataracts with lens implant  . Hiatal hernia repair  06/03/2011    Procedure: LAPAROSCOPIC REPAIR OF HIATAL HERNIA;  Surgeon: Ernestene Mention, MD;  Location: [facility];  Service: General;  Laterality: N/A;  . Laparoscopic nissen fundoplication  06/03/2011    Procedure: LAPAROSCOPIC NISSEN FUNDOPLICATION;  Surgeon: Ernestene Mention, MD;  Location: [facility];  Service: General;  Laterality: N/A;    Family History  Problem Relation Age of Onset  . Cancer Mother     colon   . Colon cancer Mother 75  . Heart disease Father 59    heart attack    History  Substance Use Topics  . Smoking status: Never Smoker   . Smokeless tobacco: Never Used  . Alcohol Use: No    OB History   Grav Para Term Preterm Abortions TAB SAB Ect Mult Living                  Review of Systems  All other systems reviewed and are negative.    Allergies  Aspartame and Daliresp  Home Medications   Current Outpatient Rx  Name  Route  Sig  Dispense  Refill  . ALPRAZolam (XANAX) 0.5 MG tablet   Oral   Take 0.5 mg by mouth at bedtime  as needed. For sleep         . amLODipine (NORVASC) 10 MG tablet   Oral   Take 10 mg by mouth daily with breakfast.          . calcium carbonate (TUMS - DOSED IN MG ELEMENTAL CALCIUM) 500 MG chewable tablet   Oral   Chew 1 tablet by mouth daily as needed. For heartburn         . Cholecalciferol (VITAMIN D) 1000 UNITS capsule   Oral   Take 6,000 Units by mouth daily.          [name] CORAL CALCIUM PO   Oral   Take 1 tablet by mouth daily.         . Cyanocobalamin (VITAMIN B 12 PO)   Oral   Take 1 tablet by mouth daily.          [name] gabapentin (NEURONTIN) 300 MG capsule   Oral   Take 300 mg by mouth at bedtime.         . hydrochlorothiazide 25 MG tablet   Oral   Take 25 mg by mouth daily with breakfast.          . HYDROcodone-acetaminophen (NORCO) 10-325 MG per tablet   Oral   Take 0.5-1 tablets by mouth 3 (three) times daily as needed. For pain          . HYDROcodone-acetaminophen (NORCO/VICODIN) 5-325 MG per tablet   Oral   Take 2 tablets by mouth every 4 (four) hours as needed for pain.   20 tablet   0   . ipratropium-albuterol (DUONEB) 0.5-2.5 (3) MG/3ML SOLN   Nebulization   Take 3 mLs by nebulization 4 (four) times daily as needed. For shortness of breath   360 mL      . mometasone-formoterol (DULERA) 200-5 MCG/ACT AERO   Inhalation   Inhale 2 puffs into the lungs 2 (two) times daily.         . naproxen (NAPROSYN) 500 MG tablet   Oral   Take 1 tablet (500 mg total) by mouth 2 (two) times daily with a meal.   30 tablet   0   . olmesartan (BENICAR) 20 MG tablet   Oral   Take 1 tablet (20 mg total) by mouth daily.         . pantoprazole (PROTONIX) 40 MG tablet   Oral   Take 40 mg by mouth 2 (two) times daily.          . pravastatin (PRAVACHOL) 40 MG tablet   Oral   Take 40 mg by mouth at bedtime.          . predniSONE (DELTASONE) 20 MG tablet   Oral   Take 2 tablets (40 mg total) by mouth daily.   10 tablet   0     BP 158/86  Pulse 84  Temp(Src) 97.5 F (36.4 C) (Oral)  Resp 22  SpO2 100%  Physical Exam  Constitutional: She is oriented to person, place, and time. She appears well-developed and well-nourished.  HENT:  Head: Normocephalic and atraumatic.  Eyes: Conjunctivae and EOM are normal. Pupils are equal, round, and reactive to light.  Neck: Normal range of motion.  Cardiovascular: Normal rate, regular rhythm and normal heart sounds.   Pulmonary/Chest: Effort normal and breath sounds normal.  Abdominal: Soft. Bowel sounds are normal.  Musculoskeletal: Normal range of motion.  Neurological: She is alert and oriented to person, place, and time.  Skin: Skin  is warm and dry.  Psychiatric: She has a normal mood and affect. Her behavior is normal.    ED Course  Procedures (including critical care time)  Labs Reviewed  CBC  COMPREHENSIVE METABOLIC PANEL   Dg Femur Right  06/19/2012   *RADIOLOGY REPORT*  Clinical Data: Status post multiple falls; right leg pain.  RIGHT FEMUR - 2 VIEW  Comparison: Right hip radiographs performed 06/13/2012  Findings: The right femur appears intact.  There is no evidence of fracture or dislocation.  The right femoral head remains seated at the acetabulum.  The right knee joint is grossly unremarkable in appearance.  No knee joint effusion is identified.  The visualized bowel gas pattern is grossly unremarkable.  No significant soft tissue abnormalities are seen on radiograph.  IMPRESSION: No evidence of fracture or dislocation.   Original Report Authenticated By: Tonia Ghent, M.D.      No diagnosis found.    MDM  No sxs of epidural compression or vascular insufficiency.  Pain began after mechanical fall.  Xray neg.  Will analgesia,  reassess  + cont hip/back pain.  Will admit      Rosanne Ashing, MD 06/19/12 1610  Adalbert Alberto Lytle Michaels, MD 06/20/12 [phone]

## 2012-06-19 NOTE — ED Notes (Signed)
Pt st's she fell few days ago due to right knee giving out on her.  Pt c/o continued pain to right hip, knee and leg.  Unable to bear wt on leg.

## 2012-06-19 NOTE — ED Notes (Signed)
Pt took 2 hydrocodones at 21:15 with no relief.  Good pedal pulse present.

## 2012-06-19 NOTE — ED Notes (Signed)
Patient transported to X-ray 

## 2012-06-20 ENCOUNTER — Observation Stay (HOSPITAL_COMMUNITY): Payer: Medicare Other

## 2012-06-20 ENCOUNTER — Emergency Department (HOSPITAL_COMMUNITY): Payer: Medicare Other

## 2012-06-20 DIAGNOSIS — M549 Dorsalgia, unspecified: Secondary | ICD-10-CM

## 2012-06-20 LAB — BASIC METABOLIC PANEL
CO2: 25 mEq/L (ref 19–32)
Chloride: 99 mEq/L (ref 96–112)
Potassium: 4.2 mEq/L (ref 3.5–5.1)
Sodium: 137 mEq/L (ref 135–145)

## 2012-06-20 LAB — CBC
MCV: 89.5 fL (ref 78.0–100.0)
Platelets: 411 10*3/uL — ABNORMAL HIGH (ref 150–400)
RBC: 4.85 MIL/uL (ref 3.87–5.11)
WBC: 10.3 10*3/uL (ref 4.0–10.5)

## 2012-06-20 MED ORDER — MOMETASONE FURO-FORMOTEROL FUM 200-5 MCG/ACT IN AERO
2.0000 | INHALATION_SPRAY | Freq: Two times a day (BID) | RESPIRATORY_TRACT | Status: DC
  Administered 2012-06-20 – 2012-06-22 (×4): 2 via RESPIRATORY_TRACT
  Filled 2012-06-20 (×2): qty 8.8

## 2012-06-20 MED ORDER — KETOROLAC TROMETHAMINE 30 MG/ML IJ SOLN
30.0000 mg | Freq: Four times a day (QID) | INTRAMUSCULAR | Status: AC
  Administered 2012-06-20 – 2012-06-21 (×5): 30 mg via INTRAVENOUS
  Filled 2012-06-20 (×4): qty 1

## 2012-06-20 MED ORDER — SENNA 8.6 MG PO TABS
1.0000 | ORAL_TABLET | Freq: Two times a day (BID) | ORAL | Status: DC
  Administered 2012-06-20 – 2012-06-22 (×5): 8.6 mg via ORAL
  Filled 2012-06-20 (×6): qty 1

## 2012-06-20 MED ORDER — PREDNISONE 50 MG PO TABS
50.0000 mg | ORAL_TABLET | Freq: Every day | ORAL | Status: DC
  Administered 2012-06-20 – 2012-06-22 (×3): 50 mg via ORAL
  Filled 2012-06-20 (×5): qty 1

## 2012-06-20 MED ORDER — IRBESARTAN 75 MG PO TABS
75.0000 mg | ORAL_TABLET | Freq: Every day | ORAL | Status: DC
  Administered 2012-06-20 – 2012-06-22 (×3): 75 mg via ORAL
  Filled 2012-06-20 (×3): qty 1

## 2012-06-20 MED ORDER — ALPRAZOLAM 0.5 MG PO TABS
0.5000 mg | ORAL_TABLET | Freq: Every evening | ORAL | Status: DC | PRN
  Administered 2012-06-20: 0.5 mg via ORAL
  Filled 2012-06-20: qty 1

## 2012-06-20 MED ORDER — LORAZEPAM 1 MG PO TABS
1.0000 mg | ORAL_TABLET | Freq: Once | ORAL | Status: AC
  Administered 2012-06-20: 1 mg via ORAL
  Filled 2012-06-20: qty 1

## 2012-06-20 MED ORDER — SIMVASTATIN 10 MG PO TABS
10.0000 mg | ORAL_TABLET | Freq: Every day | ORAL | Status: DC
  Administered 2012-06-20 – 2012-06-21 (×2): 10 mg via ORAL
  Filled 2012-06-20 (×3): qty 1

## 2012-06-20 MED ORDER — SODIUM CHLORIDE 0.9 % IV SOLN: INTRAVENOUS | Status: DC

## 2012-06-20 MED ORDER — ENOXAPARIN SODIUM 40 MG/0.4ML ~~LOC~~ SOLN
40.0000 mg | Freq: Every day | SUBCUTANEOUS | Status: DC
  Administered 2012-06-20 – 2012-06-22 (×3): 40 mg via SUBCUTANEOUS
  Filled 2012-06-20 (×3): qty 0.4

## 2012-06-20 MED ORDER — DEXAMETHASONE SODIUM PHOSPHATE 10 MG/ML IJ SOLN
10.0000 mg | Freq: Once | INTRAMUSCULAR | Status: AC
  Administered 2012-06-20: 10 mg via INTRAMUSCULAR
  Filled 2012-06-20: qty 1

## 2012-06-20 MED ORDER — MORPHINE SULFATE 2 MG/ML IJ SOLN: 1.0000 mg | INTRAMUSCULAR | Status: DC | PRN

## 2012-06-20 MED ORDER — ALBUTEROL SULFATE (5 MG/ML) 0.5% IN NEBU
2.5000 mg | INHALATION_SOLUTION | Freq: Four times a day (QID) | RESPIRATORY_TRACT | Status: DC | PRN
Start: 2012-06-20 — End: 2012-06-22

## 2012-06-20 MED ORDER — GABAPENTIN 300 MG PO CAPS
300.0000 mg | ORAL_CAPSULE | Freq: Every day | ORAL | Status: DC
  Administered 2012-06-20 – 2012-06-21 (×2): 300 mg via ORAL
  Filled 2012-06-20 (×3): qty 1

## 2012-06-20 MED ORDER — IPRATROPIUM BROMIDE 0.02 % IN SOLN: 0.5000 mg | Freq: Four times a day (QID) | RESPIRATORY_TRACT | Status: DC | PRN

## 2012-06-20 MED ORDER — MORPHINE SULFATE 2 MG/ML IJ SOLN
2.0000 mg | INTRAMUSCULAR | Status: DC | PRN
  Administered 2012-06-20: 2 mg via INTRAVENOUS
  Filled 2012-06-20: qty 1

## 2012-06-20 MED ORDER — MORPHINE SULFATE 2 MG/ML IJ SOLN
2.0000 mg | Freq: Once | INTRAMUSCULAR | Status: AC
  Administered 2012-06-20: 2 mg via INTRAVENOUS
  Filled 2012-06-20: qty 1

## 2012-06-20 MED ORDER — IPRATROPIUM-ALBUTEROL 0.5-2.5 (3) MG/3ML IN SOLN: 3.0000 mL | Freq: Four times a day (QID) | RESPIRATORY_TRACT | Status: DC | PRN

## 2012-06-20 MED ORDER — DOCUSATE SODIUM 100 MG PO CAPS
100.0000 mg | ORAL_CAPSULE | Freq: Two times a day (BID) | ORAL | Status: DC
  Administered 2012-06-20 – 2012-06-22 (×4): 100 mg via ORAL
  Filled 2012-06-20 (×4): qty 1

## 2012-06-20 MED ORDER — HYDROCHLOROTHIAZIDE 25 MG PO TABS
25.0000 mg | ORAL_TABLET | Freq: Every day | ORAL | Status: DC
  Administered 2012-06-20 – 2012-06-22 (×3): 25 mg via ORAL
  Filled 2012-06-20 (×4): qty 1

## 2012-06-20 MED ORDER — HYDROCODONE-ACETAMINOPHEN 5-325 MG PO TABS
1.0000 | ORAL_TABLET | ORAL | Status: DC | PRN
  Administered 2012-06-20 (×3): 2 via ORAL
  Administered 2012-06-21 (×2): 1 via ORAL
  Administered 2012-06-21 – 2012-06-22 (×4): 2 via ORAL
  Filled 2012-06-20: qty 1
  Filled 2012-06-20 (×7): qty 2
  Filled 2012-06-20: qty 1

## 2012-06-20 MED ORDER — AMLODIPINE BESYLATE 10 MG PO TABS
10.0000 mg | ORAL_TABLET | Freq: Every day | ORAL | Status: DC
  Administered 2012-06-20 – 2012-06-22 (×3): 10 mg via ORAL
  Filled 2012-06-20 (×4): qty 1

## 2012-06-20 MED ORDER — ONDANSETRON HCL 4 MG PO TABS: 4.0000 mg | ORAL_TABLET | Freq: Four times a day (QID) | ORAL | Status: DC | PRN

## 2012-06-20 MED ORDER — ONDANSETRON HCL 4 MG/2ML IJ SOLN: 4.0000 mg | Freq: Four times a day (QID) | INTRAMUSCULAR | Status: DC | PRN

## 2012-06-20 NOTE — Progress Notes (Signed)
Utilization review completed. Percy Comp, RN, BSN. 

## 2012-06-20 NOTE — Progress Notes (Addendum)
Patient seen and examined by me.  Plan to get MRI and then if spinal cord ok, will get PT eval.  Admitted for pain control On steroids Has seen [facility] in the past- may need injections Marisela Line DO

## 2012-06-20 NOTE — ED Provider Notes (Signed)
History     CSN: [id number]  Arrival date & time 06/19/12  2154   First MD Initiated Contact with Patient 06/19/12 2220      Chief Complaint  Patient presents with  . Hip Pain    (Consider location/radiation/quality/duration/timing/severity/associated sxs/prior treatment) HPI  Past Medical History  Diagnosis Date  . CAD (coronary artery disease)     LHC 2003 with 40% pLAD, 30% mLAD, 100% D1 (moderate vessel), 100%  D2 (small vessel), 95% mid small OM2.  Pt had PTCA to D1;  D2 and OM2 small vessels and tx medically;  Last Myoview (5/09):  EF 66%, anteroseptal/apical infarct with minimal peri-infarct ischemia.  No significant change from 11/06. Pt managed medically  . Recurrent aspiration bronchitis/pneumonia     PFTs 12/09 FVC 97%, FEV1 101%, ratio 77%, TLC 109%. No significant obstruction or restriction.   . Hypertension   . Hyperlipidemia   . Esophageal stricture   . Diverticulosis of colon   . Gastroesophageal reflux disease with hiatal hernia   . Iron deficiency anemia   . Gastritis   . GERD (gastroesophageal reflux disease)   . Hiatal hernia   . Asthma   . Blood transfusion   . Cough   . Wheezing   . Weakness   . Trouble swallowing   . Abdominal pain   . Hiatal hernia   . Shortness of breath     Past Surgical History  Procedure Laterality Date  . Back surgery    . Cholecystectomy    . Bladder repair    . Hemorrhoid surgery    . Cholecystectomy      Patient unsure of date.  [name] Appendectomy      patient unsure of date  . Total abdominal hysterectomy  1975    partial  . Coronary angioplasty  11/16/2001  . Eye surgery  11/2000    bilateral cataracts with lens implant  . Hiatal hernia repair  06/03/2011    Procedure: LAPAROSCOPIC REPAIR OF HIATAL HERNIA;  Surgeon: Ernestene Mention, MD;  Location: [facility];  Service: General;  Laterality: N/A;  . Laparoscopic nissen fundoplication  06/03/2011    Procedure: LAPAROSCOPIC NISSEN FUNDOPLICATION;  Surgeon: Ernestene Mention, MD;  Location: [facility];  Service: General;  Laterality: N/A;    Family History  Problem Relation Age of Onset  . Cancer Mother     colon   . Colon cancer Mother 22  . Heart disease Father 48    heart attack    History  Substance Use Topics  . Smoking status: Never Smoker   . Smokeless tobacco: Never Used  . Alcohol Use: No    OB History   Grav Para Term Preterm Abortions TAB SAB Ect Mult Living                  Review of Systems  Allergies  Aspartame and Daliresp  Home Medications   Current Outpatient Rx  Name  Route  Sig  Dispense  Refill  . ALPRAZolam (XANAX) 0.5 MG tablet   Oral   Take 0.5 mg by mouth at bedtime as needed. For sleep         . amLODipine (NORVASC) 10 MG tablet   Oral   Take 10 mg by mouth daily with breakfast.          . calcium carbonate (TUMS - DOSED IN MG ELEMENTAL CALCIUM) 500 MG chewable tablet   Oral   Chew 1 tablet by mouth daily as  needed. For heartburn         . Cholecalciferol (VITAMIN D) 1000 UNITS capsule   Oral   Take 6,000 Units by mouth daily.          [name] CORAL CALCIUM PO   Oral   Take 1 tablet by mouth daily.         . Cyanocobalamin (VITAMIN B 12 PO)   Oral   Take 1 tablet by mouth daily.          [name] gabapentin (NEURONTIN) 300 MG capsule   Oral   Take 300 mg by mouth at bedtime.         . hydrochlorothiazide 25 MG tablet   Oral   Take 25 mg by mouth daily with breakfast.          . HYDROcodone-acetaminophen (NORCO) 10-325 MG per tablet   Oral   Take 0.5-1 tablets by mouth 3 (three) times daily as needed. For pain         . HYDROcodone-acetaminophen (NORCO/VICODIN) 5-325 MG per tablet   Oral   Take 2 tablets by mouth every 4 (four) hours as needed for pain.   20 tablet   0   . ipratropium-albuterol (DUONEB) 0.5-2.5 (3) MG/3ML SOLN   Nebulization   Take 3 mLs by nebulization 4 (four) times daily as needed. For shortness of breath   360 mL      . mometasone-formoterol (DULERA)  200-5 MCG/ACT AERO   Inhalation   Inhale 2 puffs into the lungs 2 (two) times daily.         . naproxen (NAPROSYN) 500 MG tablet   Oral   Take 1 tablet (500 mg total) by mouth 2 (two) times daily with a meal.   30 tablet   0   . olmesartan (BENICAR) 20 MG tablet   Oral   Take 1 tablet (20 mg total) by mouth daily.         . pravastatin (PRAVACHOL) 40 MG tablet   Oral   Take 40 mg by mouth at bedtime.          . predniSONE (DELTASONE) 20 MG tablet   Oral   Take 2 tablets (40 mg total) by mouth daily.   10 tablet   0     BP 151/67  Pulse 92  Temp(Src) 98.5 F (36.9 C) (Oral)  Resp 22  SpO2 100%  Physical Exam  ED Course  Procedures (including critical care time)  Labs Reviewed - No data to display Ct Abdomen Pelvis Wo Contrast  06/20/2012  *RADIOLOGY REPORT*  Clinical Data: Back pain.  Fall.  CT ABDOMEN AND PELVIS WITHOUT CONTRAST  Technique:  Multidetector CT imaging of the abdomen and pelvis was performed following the standard protocol without intravenous contrast.  Comparison: 04/26/2011  Findings: Hiatal hernia wrap noted above the diaphragm.  The visualized portion of the liver, spleen, pancreas, and adrenal glands appear unremarkable in noncontrast CT appearance.  The kidneys appear unremarkable, as do the proximal ureters.  Gallbladder surgically absent.  Common bile duct approximately 1.1 cm in diameter.  No pathologic retroperitoneal or porta hepatis adenopathy is identified.  Aortoiliac atherosclerotic calcification is present.  Borderline prominence stool noted in the colon, particularly the cecum.  Appendix absent.  Uterus absent.  The ovaries unremarkable.  Urinary bladder appears unremarkable.  No significant change in the L1 compression fracture.  Grade 1 anterolisthesis at L4-5 is again noted with degenerative facet arthropathy, but without pars defects.  Posterior decompression  is again noted at the L5-S1 level.  Suspected disc bulge at L4-5, similar to  prior.  IMPRESSION:  1.  Unchanged appearance of L1 compression fracture and lumbar spondylosis and degenerative disc disease. 2.  Borderline prominence of stool in the colon. 3.  Atherosclerosis. 4.  Hiatal hernia wrap noted above the diaphragm.   Original Report Authenticated By: Gaylyn Rong, M.D.    Dg Femur Right  06/19/2012  *RADIOLOGY REPORT*  Clinical Data: Status post multiple falls; right leg pain.  RIGHT FEMUR - 2 VIEW  Comparison: Right hip radiographs performed 06/13/2012  Findings: The right femur appears intact.  There is no evidence of fracture or dislocation.  The right femoral head remains seated at the acetabulum.  The right knee joint is grossly unremarkable in appearance.  No knee joint effusion is identified.  The visualized bowel gas pattern is grossly unremarkable.  No significant soft tissue abnormalities are seen on radiograph.  IMPRESSION: No evidence of fracture or dislocation.   Original Report Authenticated By: Tonia Ghent, M.D.      1. Back pain       MDM  + continued pain.  Will admit for pain control        Georjean Toya Lytle Michaels, MD 06/20/12 0320

## 2012-06-20 NOTE — H&P (Signed)
History and Physical  [name] [name] WUJ:[id number] DOB: 07-21-1935 DOA: 06/19/2012  Referring physician: ER PCP: [name], MD   Chief Complaint: right leg pain  HPI:  Patient is a 77 year old female with past medical history most significant for chronic back pain with multiple operations in the past who presented to [facility] ([place] emergency department for acute right-sided leg pain which started after a fall on Saturday that this 3 days prior to admission. The pain is described as 10 out of 10 in intensity, worse with any movement, no relieving factors, pain has come down to 7/10 after pain medications in the ER, no associated nausea or vomiting, no change in urinary habits or bowel movements. Patient is able to walk but with much difficulty. There is no weakness or tingling noted in the lower extremities. Patient denies any pain in the back. The pain starts from the buttocks and radiates towards the side of the right leg and goes all the way down to the foot.  CT abdomen and pelvis was suggestive of unchanged appearance of the L1 compression fracture and lumbar spondylosis with degenerative disc disease. Pain was not relieved with IV pain medications in the ER and hence it was decided to admit the patient for pain relief.  No other complaints at this time.   Review of Systems:  49 Point review of system was negative except what is noted above  Past Medical History  Diagnosis Date  . CAD (coronary artery disease)     LHC 2003 with 40% pLAD, 30% mLAD, 100% D1 (moderate vessel), 100%  D2 (small vessel), 95% mid small OM2.  Pt had PTCA to D1;  D2 and OM2 small vessels and tx medically;  Last Myoview (5/09):  EF 66%, anteroseptal/apical infarct with minimal peri-infarct ischemia.  No significant change from 11/06. Pt managed medically  . Recurrent aspiration bronchitis/pneumonia     PFTs 12/09 FVC 97%, FEV1 101%, ratio 77%, TLC 109%. No significant obstruction or restriction.   .  Hypertension   . Hyperlipidemia   . Esophageal stricture   . Diverticulosis of colon   . Gastroesophageal reflux disease with hiatal hernia   . Iron deficiency anemia   . Gastritis   . GERD (gastroesophageal reflux disease)   . Hiatal hernia   . Asthma   . Blood transfusion   . Cough   . Wheezing   . Weakness   . Trouble swallowing   . Abdominal pain   . Hiatal hernia   . Shortness of breath     Past Surgical History  Procedure Laterality Date  . Back surgery    . Cholecystectomy    . Bladder repair    . Hemorrhoid surgery    . Cholecystectomy      Patient unsure of date.  [name] Appendectomy      patient unsure of date  . Total abdominal hysterectomy  1975    partial  . Coronary angioplasty  11/16/2001  . Eye surgery  11/2000    bilateral cataracts with lens implant  . Hiatal hernia repair  06/03/2011    Procedure: LAPAROSCOPIC REPAIR OF HIATAL HERNIA;  Surgeon: Ernestene Mention, MD;  Location: [facility];  Service: General;  Laterality: N/A;  . Laparoscopic nissen fundoplication  06/03/2011    Procedure: LAPAROSCOPIC NISSEN FUNDOPLICATION;  Surgeon: Ernestene Mention, MD;  Location: [facility];  Service: General;  Laterality: N/A;    Social History:  reports that she has never smoked. She has never used smokeless tobacco.  She reports that she does not drink alcohol or use illicit drugs.  Allergies  Allergen Reactions  . Aspartame Diarrhea and Nausea And Vomiting  . Daliresp (Roflumilast) Nausea And Vomiting    Family History  Problem Relation Age of Onset  . Cancer Mother     colon   . Colon cancer Mother 29  . Heart disease Father 50    heart attack     Prior to Admission medications   Medication Sig Start Date End Date Taking? Authorizing Provider  ALPRAZolam Prudy Feeler) 0.5 MG tablet Take 0.5 mg by mouth at bedtime as needed. For sleep   Yes Historical Provider, MD  amLODipine (NORVASC) 10 MG tablet Take 10 mg by mouth daily with breakfast.    Yes Historical Provider,  MD  calcium carbonate (TUMS - DOSED IN MG ELEMENTAL CALCIUM) 500 MG chewable tablet Chew 1 tablet by mouth daily as needed. For heartburn   Yes Historical Provider, MD  Cholecalciferol (VITAMIN D) 1000 UNITS capsule Take 6,000 Units by mouth daily.    Yes Historical Provider, MD  CORAL CALCIUM PO Take 1 tablet by mouth daily.   Yes Historical Provider, MD  Cyanocobalamin (VITAMIN B 12 PO) Take 1 tablet by mouth daily.    Yes Historical Provider, MD  gabapentin (NEURONTIN) 300 MG capsule Take 300 mg by mouth at bedtime.   Yes Historical Provider, MD  hydrochlorothiazide 25 MG tablet Take 25 mg by mouth daily with breakfast.  09/11/10  Yes Historical Provider, MD  HYDROcodone-acetaminophen (NORCO) 10-325 MG per tablet Take 0.5-1 tablets by mouth 3 (three) times daily as needed. For pain   Yes Historical Provider, MD  HYDROcodone-acetaminophen (NORCO/VICODIN) 5-325 MG per tablet Take 2 tablets by mouth every 4 (four) hours as needed for pain. 06/13/12  Yes Hannah Muthersbaugh, PA-C  ipratropium-albuterol (DUONEB) 0.5-2.5 (3) MG/3ML SOLN Take 3 mLs by nebulization 4 (four) times daily as needed. For shortness of breath 10/12/10  Yes Beatrice Lecher, PA  mometasone-formoterol (DULERA) 200-5 MCG/ACT AERO Inhale 2 puffs into the lungs 2 (two) times daily. 01/25/12  Yes Nyoka Cowden, MD  naproxen (NAPROSYN) 500 MG tablet Take 1 tablet (500 mg total) by mouth 2 (two) times daily with a meal. 06/13/12  Yes Hannah Muthersbaugh, PA-C  olmesartan (BENICAR) 20 MG tablet Take 1 tablet (20 mg total) by mouth daily. 01/25/12  Yes Nyoka Cowden, MD  pravastatin (PRAVACHOL) 40 MG tablet Take 40 mg by mouth at bedtime.    Yes Historical Provider, MD  predniSONE (DELTASONE) 20 MG tablet Take 2 tablets (40 mg total) by mouth daily. 06/13/12   Dierdre Forth, PA-C   Physical Exam: Filed Vitals:   06/19/12 2157 06/20/12 0246 06/20/12 0502  BP: 158/86 151/67 145/68  Pulse: 84 92 87  Temp: 97.5 F (36.4 C) 98.5 F (36.9 C)  97.9 F (36.6 C)  TempSrc: Oral Oral Oral  Resp: 22 22 18   Weight:   175 lb 11.3 oz (79.7 kg)  SpO2: 100% 100% 100%    Physical Exam: General: Vital signs reviewed and noted. Well-developed, well-nourished, in no acute distress; alert, appropriate and cooperative throughout examination.  Head: Normocephalic, atraumatic.  Eyes: PERRL, EOMI, No signs of anemia or jaundince.  Nose: Mucous membranes moist, not inflammed, nonerythematous.  Throat: Oropharynx nonerythematous, no exudate appreciated.   Neck: No deformities, masses, or tenderness noted.Supple, No carotid Bruits, no JVD.  Lungs:  Normal respiratory effort. Clear to auscultation BL without crackles or wheezes.  Heart: RRR. S1  and S2 normal without gallop, murmur, or rubs.  Abdomen:  BS normoactive. Soft, Nondistended, non-tender.  No masses or organomegaly.  Extremities: No pretibial edema, straight leg raising test is negative , patient is able to move all 4 extremities, tenderness to palpation present over the right buttock, leg and knee.  Neurologic: A&O X3, CN II - XII are grossly intact. Motor strength is 5/5 in the all 4 extremities, Sensations intact to light touch, Cerebellar signs negative.  Skin: No visible rashes, scars.     Wt Readings from Last 3 Encounters:  06/20/12 175 lb 11.3 oz (79.7 kg)  04/02/12 166 lb 9.6 oz (75.569 kg)  02/20/12 165 lb 9.6 oz (75.116 kg)    Labs on Admission:    Radiological Exams on Admission: Ct Abdomen Pelvis Wo Contrast  06/20/2012  *RADIOLOGY REPORT*  Clinical Data: Back pain.  Fall.  CT ABDOMEN AND PELVIS WITHOUT CONTRAST  Technique:  Multidetector CT imaging of the abdomen and pelvis was performed following the standard protocol without intravenous contrast.  Comparison: 04/26/2011  Findings: Hiatal hernia wrap noted above the diaphragm.  The visualized portion of the liver, spleen, pancreas, and adrenal glands appear unremarkable in noncontrast CT appearance.  The kidneys appear  unremarkable, as do the proximal ureters.  Gallbladder surgically absent.  Common bile duct approximately 1.1 cm in diameter.  No pathologic retroperitoneal or porta hepatis adenopathy is identified.  Aortoiliac atherosclerotic calcification is present.  Borderline prominence stool noted in the colon, particularly the cecum.  Appendix absent.  Uterus absent.  The ovaries unremarkable.  Urinary bladder appears unremarkable.  No significant change in the L1 compression fracture.  Grade 1 anterolisthesis at L4-5 is again noted with degenerative facet arthropathy, but without pars defects.  Posterior decompression is again noted at the L5-S1 level.  Suspected disc bulge at L4-5, similar to prior.  IMPRESSION:  1.  Unchanged appearance of L1 compression fracture and lumbar spondylosis and degenerative disc disease. 2.  Borderline prominence of stool in the colon. 3.  Atherosclerosis. 4.  Hiatal hernia wrap noted above the diaphragm.   Original Report Authenticated By: Gaylyn Rong, M.D.    Dg Femur Right  06/19/2012  *RADIOLOGY REPORT*  Clinical Data: Status post multiple falls; right leg pain.  RIGHT FEMUR - 2 VIEW  Comparison: Right hip radiographs performed 06/13/2012  Findings: The right femur appears intact.  There is no evidence of fracture or dislocation.  The right femoral head remains seated at the acetabulum.  The right knee joint is grossly unremarkable in appearance.  No knee joint effusion is identified.  The visualized bowel gas pattern is grossly unremarkable.  No significant soft tissue abnormalities are seen on radiograph.  IMPRESSION: No evidence of fracture or dislocation.   Original Report Authenticated By: Tonia Ghent, M.D.     EKG: Not done   Principal Problem:   Acute back pain with sciatica Active Problems:   HYPERLIPIDEMIA   HYPERTENSION   CAD, UNSPECIFIED SITE   ASTHMA   BACK PAIN, THORACIC REGION   Assessment/Plan Patient started having acute onset right-sided leg  pain radiating to the foot after a mechanical fall. Patient has had history of multiple back surgeries and chronic back pain. Although the imaging studies done in the ER does not show any new changes of injury. Patient has been admitted purely for pain control.  -Admit to MedSurg -Morphine 2 mg every 4 hours -Ketorolac 30 mg IV every 6 hours -Prednisone 50 mg daily for 5 days and  then stop -MRI lumbar spine without contrast to assess spinal cord -Physical therapy -IV fluids for hydration -Home when stable  Code Status: full Family Communication: dad updated at bedside Disposition Plan/Anticipated LOS: home when stable  Time spent: 70 minutes  Lars Mage, MD  [facility] Team 10 If 7PM-7AM, please contact night-coverage at [email or web address], password [facility] 06/20/2012, 5:49 AM

## 2012-06-20 NOTE — ED Notes (Signed)
Pt continues to c/o pain to right hip and knee.  Pt and family updated on plan of care.  Pt voided approx in bedpan.

## 2012-06-20 NOTE — ED Notes (Signed)
Patient transported to CT 

## 2012-06-20 NOTE — Progress Notes (Signed)
PT Cancellation Note  Patient Details Name: [name] [name] MRN: [id number] DOB: 06-21-1935   Cancelled Treatment:    Reason Eval/Treat Not Completed: Patient not medically ready (Pt on bed rest.)   Amil Bouwman 06/20/2012, 3:59 PM Hilmar Moldovan L. Takeyla Million DPT [phone]

## 2012-06-21 DIAGNOSIS — M5430 Sciatica, unspecified side: Secondary | ICD-10-CM

## 2012-06-21 MED ORDER — MORPHINE SULFATE 2 MG/ML IJ SOLN: 0.5000 mg | INTRAMUSCULAR | Status: DC | PRN

## 2012-06-21 NOTE — Progress Notes (Signed)
[facility] PROGRESS NOTE  [name] [name] ZOX:[id number] DOB: 1935/12/04 DOA: 06/19/2012 PCP: [name], MD  Assessment/Plan: 1. Back pain/right leg pain- +fall, MRI showed: Extruded and sequestered appearing disc fragment in the right lateral recess just cephalad to the L4-5 disc interspace could impact the descending and exiting right L4 root. Advanced facet arthropathy at L4-5 results in 0.5 cm of anterolisthesis. Central canal is mildly narrowed this level. Remote T12 and L1 compression fractures.  Status post posterior decompression L5-S1. Central canal is open. Foramina patent. Patient follows with Dr. Bettina Gavia- touched base with on call person who said nothing acutely needed to be done but patient may require a steroid/epidural injection if not better with conservative measures   Code Status: full Family Communication: husband at bedside Disposition Plan: home   Consultants:  PT  Procedures:  none  Antibiotics:  none  HPI/Subjective: Still having back pain awaiting PT eval  Objective: Filed Vitals:   06/20/12 1900 06/21/12 0630 06/21/12 0904 06/21/12 0905  BP: 124/68 111/62 128/65   Pulse: 107 97    Temp: 98.2 F (36.8 C) 98.5 F (36.9 C)    TempSrc:      Resp: 16 16    Weight:      SpO2: 97% 98%  98%    Intake/Output Summary (Last 24 hours) at 06/21/12 1008 Last data filed at 06/21/12 0700  Gross per 24 hour  Intake    960 ml  Output      0 ml  Net    960 ml   Filed Weights   06/20/12 0502  Weight: 79.7 kg (175 lb 11.3 oz)    Exam:   General:  A+Ox3, NAD  Cardiovascular: rrr  Respiratory: clear anterior  Abdomen: +BS, soft, NT/ND  Back: right sided muscle tenderness  Data Reviewed: Basic Metabolic Panel:  Recent Labs Lab 06/20/12 1100  NA 137  K 4.2  CL 99  CO2 25  GLUCOSE 141*  BUN 24*  CREATININE 0.87  CALCIUM 9.4   Liver Function Tests: No results found for this basename: AST, ALT, ALKPHOS, BILITOT,  PROT, ALBUMIN,  in the last 168 hours No results found for this basename: LIPASE, AMYLASE,  in the last 168 hours No results found for this basename: AMMONIA,  in the last 168 hours CBC:  Recent Labs Lab 06/20/12 1100  WBC 10.3  HGB 14.6  HCT 43.4  MCV 89.5  PLT 411*   Cardiac Enzymes: No results found for this basename: CKTOTAL, CKMB, CKMBINDEX, TROPONINI,  in the last 168 hours BNP (last 3 results) No results found for this basename: PROBNP,  in the last 8760 hours CBG: No results found for this basename: GLUCAP,  in the last 168 hours  No results found for this or any previous visit (from the past 240 hour(s)).   Studies: Ct Abdomen Pelvis Wo Contrast  06/20/2012  *RADIOLOGY REPORT*  Clinical Data: Back pain.  Fall.  CT ABDOMEN AND PELVIS WITHOUT CONTRAST  Technique:  Multidetector CT imaging of the abdomen and pelvis was performed following the standard protocol without intravenous contrast.  Comparison: 04/26/2011  Findings: Hiatal hernia wrap noted above the diaphragm.  The visualized portion of the liver, spleen, pancreas, and adrenal glands appear unremarkable in noncontrast CT appearance.  The kidneys appear unremarkable, as do the proximal ureters.  Gallbladder surgically absent.  Common bile duct approximately 1.1 cm in diameter.  No pathologic retroperitoneal or porta hepatis adenopathy is identified.  Aortoiliac atherosclerotic calcification is present.  Borderline  prominence stool noted in the colon, particularly the cecum.  Appendix absent.  Uterus absent.  The ovaries unremarkable.  Urinary bladder appears unremarkable.  No significant change in the L1 compression fracture.  Grade 1 anterolisthesis at L4-5 is again noted with degenerative facet arthropathy, but without pars defects.  Posterior decompression is again noted at the L5-S1 level.  Suspected disc bulge at L4-5, similar to prior.  IMPRESSION:  1.  Unchanged appearance of L1 compression fracture and lumbar  spondylosis and degenerative disc disease. 2.  Borderline prominence of stool in the colon. 3.  Atherosclerosis. 4.  Hiatal hernia wrap noted above the diaphragm.   Original Report Authenticated By: Gaylyn Rong, M.D.    Dg Femur Right  06/19/2012  *RADIOLOGY REPORT*  Clinical Data: Status post multiple falls; right leg pain.  RIGHT FEMUR - 2 VIEW  Comparison: Right hip radiographs performed 06/13/2012  Findings: The right femur appears intact.  There is no evidence of fracture or dislocation.  The right femoral head remains seated at the acetabulum.  The right knee joint is grossly unremarkable in appearance.  No knee joint effusion is identified.  The visualized bowel gas pattern is grossly unremarkable.  No significant soft tissue abnormalities are seen on radiograph.  IMPRESSION: No evidence of fracture or dislocation.   Original Report Authenticated By: Tonia Ghent, M.D.    Mr Lumbar Spine Wo Contrast  06/20/2012  *RADIOLOGY REPORT*  Clinical Data: Chronic back pain. History of prior surgery.  Right leg pain.  Status post fall  MRI LUMBAR SPINE WITHOUT CONTRAST  Technique:  Multiplanar and multiecho pulse sequences of the lumbar spine were obtained without intravenous contrast.  Comparison: CT abdomen and pelvis 06/20/2012 and 04/26/2011.  Findings: The patient has 0.5 cm of anterolisthesis of L4 on L5 due to advanced facet degenerative disease.  Remote T12 and L1 compression fractures are identified.  Vertebral body height loss of T12 is estimated 20% centrally.  More severe compression fracture L1 is also seen with some bony retropulsion as described below.  Vertebral body height is otherwise maintained.  No worrisome marrow lesion is identified.  The conus medullaris is normal in signal and position. Imaged intra-abdominal contents are unremarkable.  The T10-11 level is imaged in the sagittal plane only and negative.  T11-12:  Negative.  T12-L1:  Mild bony retropulsion off the superior plate of  L1 is seen but the central spinal canal and neural foramina are widely patent.  L1-2:  Minimal disc bulge without central canal or foraminal narrowing.  L2-3:  Negative.  L3-4:  There is ligamentum flavum thickening and a shallow disc bulge with moderate facet degenerative change.  Central canal is mildly narrowed.  Foramina are open.  L4-5:  Advanced facet degenerative change is present.  The disc is uncovered with a bulge.  There is a superimposed right lateral recess cephalad extrusion with a sequestered fragment extending just into the right foramen.  The disc fragment measures 0.8 cm cranial-caudal by 0.6 cm AP by 0.8 cm transverse and could impact the descending and exiting right L4 root.  The central canal is only mildly narrowed.  Mild to moderate left foraminal narrowing is identified.  L5-S1:  Small disc osteophyte complex.  The patient is status post posterior decompression.  Facet arthropathy noted.  Central canal is open.  Foramina are mildly narrowed.  IMPRESSION:  1.  Extruded and sequestered appearing disc fragment in the right lateral recess just cephalad to the L4-5 disc interspace could impact the  descending and exiting right L4 root. 2.  Advanced facet arthropathy at L4-5 results in 0.5 cm of anterolisthesis.  Central canal is mildly narrowed this level. 3.  Remote T12 and L1 compression fractures. 4.  Status post posterior decompression L5-S1.  Central canal is open.  Foramina patent.   Original Report Authenticated By: Holley Dexter, M.D.     Scheduled Meds: . amLODipine  10 mg Oral Q breakfast  . docusate sodium  100 mg Oral BID  . enoxaparin (LOVENOX) injection  40 mg Subcutaneous Daily  . gabapentin  300 mg Oral QHS  . hydrochlorothiazide  25 mg Oral Q breakfast  . irbesartan  75 mg Oral Daily  . mometasone-formoterol  2 puff Inhalation BID  . predniSONE  50 mg Oral Q breakfast  . senna  1 tablet Oral BID  . simvastatin  10 mg Oral q1800   Continuous Infusions: . sodium  chloride      Principal Problem:   Acute back pain with sciatica Active Problems:   HYPERLIPIDEMIA   HYPERTENSION   CAD, UNSPECIFIED SITE   ASTHMA   BACK PAIN, THORACIC REGION    Time spent: 35    [facility], Jun Rightmyer  [facility] Pager [phone]. If 8PM-8AM, please contact night-coverage at [email or web address], password [facility] 06/21/2012, 10:08 AM  LOS: 2 days

## 2012-06-21 NOTE — Evaluation (Signed)
Occupational Therapy Evaluation and Discharge Patient Details Name: [name] [name] MRN: [id number] DOB: [birth year] Today's Date: 06/21/2012 Time: 1324-4010 OT Time Calculation (min): 10 min  OT Assessment / Plan / Recommendation Clinical Impression  This 77 yo female admitted with right leg pain (question sciatia) presents to acute OT with all education completed. Will sign off.    OT Assessment  Patient does not need any further OT services    Follow Up Recommendations  No OT follow up                Precautions / Restrictions Precautions Precautions: None Precaution Comments: I recommended that she follow back precautions to as to hope to not aggravate her condition any further Restrictions Weight Bearing Restrictions: No       ADL  ADL Comments: Issued pt a handout on back precautions to hopefully follos so she does not aggravate her condion any further. Pt crosses her legs one over the other to get to her feet and this is the best way to protect her back. Pt states she has a built in seat but doe not use it. She says that standing really increases her pain, so I recommended to her to use her built in shower seat, get the grab bar installed as well as a hand held shower.  She says that standing to cook in the kitchen can increase her pain as well, she states that she does have a stool so I recommended that she try propping on this at the counter/stove to see if this would help.        Visit Information  Last OT Received On: 06/21/12 Assistance Needed: +1    Subjective Data  Subjective: My husband can help me if I need it   Prior Functioning     Home Living Lives With: Spouse Available Help at Discharge: Family;Available 24 hours/day Type of Home: House Home Access: Level entry Home Layout: One level Bathroom Shower/Tub: Walk-in shower;Door [employer] Toilet: Handicapped height Bathroom Accessibility: Yes How Accessible: Accessible via walker Home Adaptive  Equipment: Built-in shower seat;Grab bars around toilet;Walker - rolling Additional Comments: Has a grab bar for shower, but it is not put up yet Prior Function Level of Independence: Independent with assistive device(s) Able to Take Stairs?: Yes Driving: Yes Vocation: Retired Musician: 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 Dominant Hand: Right            Cognition  Cognition Overall Cognitive Status: Appears within functional limits for tasks assessed/performed Arousal/Alertness: Awake/alert Orientation Level: Appears intact for tasks assessed Behavior During Session: [facility] for tasks performed    Extremity/Trunk Assessment Right Upper Extremity Assessment RUE ROM/Strength/Tone: Within functional levels Left Upper Extremity Assessment LUE ROM/Strength/Tone: Within functional levels              End of Session OT - End of Session Patient left: in chair;with call bell/phone within reach  GO Functional Assessment Tool Used: Clinical judgement Functional Limitation: Self care Self Care Current Status (U7253): At least 1 percent but less than 20 percent impaired, limited or restricted Self Care Goal Status (G6440): At least 1 percent but less than 20 percent impaired, limited or restricted Self Care Discharge Status [phone]): At least 1 percent but less than 20 percent impaired, limited or restricted   Evette Georges 06/21/2012, 2:04 PM

## 2012-06-21 NOTE — Evaluation (Signed)
Physical Therapy Evaluation Patient Details Name: [name] [name] MRN: [id number] DOB: 01-07-1936 Today's Date: 06/21/2012 Time: 1124- 1152    PT Assessment / Plan / Recommendation Clinical Impression  Pt is a 77 y/o female with c/o intractable back pain.  Pt demonstrates safety and independence with mobility and presents with no acute PTneeds.   Pt may benefit from OPPT follow-up to address pain, bilateral LE weakness and core weakness.  Pt c/o R knee pain.  Exam suggest knee pain may not be related to back pain.  Suggest further medical evaluation of R knee.      PT Assessment  All further PT needs can be met in the next venue of care    Follow Up Recommendations  Outpatient PT    Does the patient have the potential to tolerate intense rehabilitation      Barriers to Discharge        Equipment Recommendations  None recommended by PT    Recommendations for Other Services     Frequency      Precautions / Restrictions Precautions Precautions: None Restrictions Weight Bearing Restrictions: No   Pertinent Vitals/Pain 4/10 pain in R gluteal region and anterior lateral R knee.        Mobility  Bed Mobility Bed Mobility: Left Sidelying to Sit;Rolling Left;Supine to Sit Rolling Left: 7: Independent Left Sidelying to Sit: 7: Independent Supine to Sit: 7: Independent Transfers Transfers: Sit to Stand;Stand to Sit Sit to Stand: 5: Supervision;7: Independent Stand to Sit: 5: Supervision;7: Independent Details for Transfer Assistance: supervision for safety Ambulation/Gait Ambulation/Gait Assistance: 7: Independent;5: Supervision Ambulation Distance (Feet): 150 Feet Assistive device: None Gait Pattern: Within Functional Limits Stairs: Yes Stairs Assistance: 7: Independent Stair Management Technique: No rails Number of Stairs: 1 Wheelchair Mobility Wheelchair Mobility: No    Exercises     PT Diagnosis: Generalized weakness;Acute pain  PT Problem List: Decreased  strength;Pain PT Treatment Interventions:     PT Goals    Visit Information  Last PT Received On: 06/21/12    Subjective Data  Subjective: agree to PT eval.    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    Copywriter, advertising Overall Cognitive Status: Appears within functional limits for tasks assessed/performed Arousal/Alertness: Awake/alert Orientation Level: Appears intact for tasks assessed Behavior During Session: [facility] for tasks performed    Extremity/Trunk Assessment Right Upper Extremity Assessment RUE ROM/Strength/Tone: Within functional levels Left Upper Extremity Assessment LUE ROM/Strength/Tone: Within functional levels Right Lower Extremity Assessment RLE ROM/Strength/Tone: Deficits RLE ROM/Strength/Tone Deficits: gross LE weakness 4/5. RLE Sensation: WFL - Light Touch;WFL - Proprioception RLE Coordination: WFL - gross motor Left Lower Extremity Assessment LLE ROM/Strength/Tone: Deficits LLE ROM/Strength/Tone Deficits: generalized weaknesss.  4/5 grossly LLE Sensation: WFL - Proprioception;WFL - Light Touch LLE Coordination: WFL - gross motor Trunk Assessment Trunk Assessment: Normal   Balance    End of Session PT - End of Session Equipment Utilized During Treatment: Gait belt Activity Tolerance: Patient tolerated treatment well Patient left: in chair;with call bell/phone within reach Nurse Communication: Mobility  status  GP     Alferd Apa 06/21/2012,  1:37 PM Kaija Kovacevic L. Ajeet Casasola DPT [phone]

## 2012-06-22 DIAGNOSIS — I10 Essential (primary) hypertension: Secondary | ICD-10-CM

## 2012-06-22 MED ORDER — HYDROCODONE-ACETAMINOPHEN 5-325 MG PO TABS: 1.0000 | ORAL_TABLET | Freq: Four times a day (QID) | ORAL | Status: DC | PRN

## 2012-06-22 NOTE — Discharge Summary (Signed)
Physician Discharge Summary  Patient ID: [name] [name] MRN: [id number] DOB/AGE: 11-27-1935 77 y.o.  Admit date: 06/19/2012 Discharge date: 06/22/2012  Admission Diagnoses: Back pain and knee pain  Discharge Diagnoses:  Principal Problem:   Acute back pain with sciatica Active Problems:   HYPERLIPIDEMIA   HYPERTENSION   CAD, UNSPECIFIED SITE   ASTHMA   BACK PAIN, THORACIC REGION   Discharged Condition: good  Hospital Course:   [name] [name] is a 77yo woman with a PMH of chronic back pain who presented to the [facility] ED for acute right sided leg pain which started after a fall 3 days prior to admission.  She had limited ambulation.  The pain was described as starting from the buttocks and radiating down the right leg to the toes.  However, on day of discharge she noted that this pain was not severe and it was actually her right knee that was causing her most of her troubles.  She had a CT a/p in the ED which showed unchanged L1 compression fracture and lumbar spondylosis and she was admitted for pain control.  She also underwent an MRI of the lumbar spine which showed arthropathy, extruded disk and mild central canal narrowing with a remote compression fracture.  She had PT evaluation which suggested outpatient PT, however, when discussed on day of discharge, [name] [name] was not interested in PT and instead wanted to see her orthopedic surgeon.  She was tolerating PO pain medication on the day of discharge, as well as PO food and was ambulating with some pain. She was considered stable to go home.   Consults: PT  Significant Diagnostic Studies: radiology: CT scan A/P: IMPRESSION: 1. Unchanged appearance of L1 compression fracture and lumbar spondylosis and degenerative disc disease.  2. Borderline prominence of stool in the colon. 3. Atherosclerosis.  4. Hiatal hernia wrap noted above the diaphragm.    MRI Lspine: IMPRESSION:  1. Extruded and sequestered appearing disc fragment in the right  lateral  recess just cephalad to the L4-5 disc interspace could  impact the descending and exiting right L4 root.  2. Advanced facet arthropathy at L4-5 results in 0.5 cm of  anterolisthesis. Central canal is mildly narrowed this level.  3. Remote T12 and L1 compression fractures.  4. Status post posterior decompression L5-S1. Central canal is  open. Foramina patent.  Treatments: analgesia: Morphine and toradol  Discharge Exam: Blood pressure 134/83, pulse 82, temperature 98.6 F (37 C), temperature source Oral, resp. rate 18, weight 175 lb 11.3 oz (79.7 kg), SpO2 98.00%. General appearance: alert, cooperative, appears stated age and no distress Eyes: EOMI, anicteric sclerae Resp: clear to auscultation bilaterally Cardio: RR, NR, no murmur Extremities: + crepitus in bilateral knees, no erythema or warmth over joints Pulses: 2+ and symmetric Skin: clear, no rash or wound.   Disposition: 01-Home or Self Care   Future Appointments Provider Department Dept Phone   07/16/2012 1:30 PM Oretha Milch, MD [facility] [phone]       Medication List    STOP taking these medications       HYDROcodone-acetaminophen 10-325 MG per tablet  Commonly known as:  NORCO     predniSONE 20 MG tablet  Commonly known as:  DELTASONE      TAKE these medications       ALPRAZolam 0.5 MG tablet  Commonly known as:  XANAX  Take 0.5 mg by mouth at bedtime as needed. For sleep     amLODipine 10 MG tablet  Commonly known as:  NORVASC  Take 10 mg by mouth daily with breakfast.     calcium carbonate 500 MG chewable tablet  Commonly known as:  TUMS - dosed in mg elemental calcium  Chew 1 tablet by mouth daily as needed. For heartburn     CORAL CALCIUM PO  Take 1 tablet by mouth daily.     gabapentin 300 MG capsule  Commonly known as:  NEURONTIN  Take 300 mg by mouth at bedtime.     hydrochlorothiazide 25 MG tablet  Commonly known as:  HYDRODIURIL  Take 25 mg by mouth daily with  breakfast.     HYDROcodone-acetaminophen 5-325 MG per tablet  Commonly known as:  NORCO/VICODIN  Take 1-2 tablets by mouth every 6 (six) hours as needed.     ipratropium-albuterol 0.5-2.5 (3) MG/3ML Soln  Commonly known as:  DUONEB  Take 3 mLs by nebulization 4 (four) times daily as needed. For shortness of breath     mometasone-formoterol 200-5 MCG/ACT Aero  Commonly known as:  DULERA  Inhale 2 puffs into the lungs 2 (two) times daily.     naproxen 500 MG tablet  Commonly known as:  NAPROSYN  Take 1 tablet (500 mg total) by mouth 2 (two) times daily with a meal.     olmesartan 20 MG tablet  Commonly known as:  BENICAR  Take 1 tablet (20 mg total) by mouth daily.     omeprazole 20 MG capsule  Commonly known as:  PRILOSEC  Take 20 mg by mouth daily.     pravastatin 40 MG tablet  Commonly known as:  PRAVACHOL  Take 40 mg by mouth at bedtime.     VITAMIN B 12 PO  Take 1 tablet by mouth daily.     Vitamin D 1000 UNITS capsule  Take 6,000 Units by mouth daily.       Time spent on discharge: 40 minutes  Signed: MULLEN, EMILY 06/22/2012, 7:30 PM

## 2012-06-22 NOTE — Progress Notes (Signed)
Patient discharged in stable condition via wheelchair. Discharge instructions and prescriptions were given and explained 

## 2012-06-23 ENCOUNTER — Inpatient Hospital Stay (HOSPITAL_COMMUNITY)
Admission: EM | Admit: 2012-06-23 | Discharge: 2012-06-28 | DRG: 490 | Disposition: A | Discharge status: Home Health | Payer: Medicare Other | Attending: Specialist | Admitting: Specialist

## 2012-06-23 ENCOUNTER — Encounter (HOSPITAL_COMMUNITY): Payer: Self-pay | Admitting: *Deleted

## 2012-06-23 DIAGNOSIS — J4520 Mild intermittent asthma, uncomplicated: Secondary | ICD-10-CM | POA: Diagnosis present

## 2012-06-23 DIAGNOSIS — I2510 Atherosclerotic heart disease of native coronary artery without angina pectoris: Secondary | ICD-10-CM | POA: Diagnosis present

## 2012-06-23 DIAGNOSIS — M51379 Other intervertebral disc degeneration, lumbosacral region without mention of lumbar back pain or lower extremity pain: Secondary | ICD-10-CM | POA: Diagnosis present

## 2012-06-23 DIAGNOSIS — K219 Gastro-esophageal reflux disease without esophagitis: Secondary | ICD-10-CM | POA: Diagnosis present

## 2012-06-23 DIAGNOSIS — M4850XA Collapsed vertebra, not elsewhere classified, site unspecified, initial encounter for fracture: Secondary | ICD-10-CM

## 2012-06-23 DIAGNOSIS — Z8249 Family history of ischemic heart disease and other diseases of the circulatory system: Secondary | ICD-10-CM

## 2012-06-23 DIAGNOSIS — M8448XA Pathological fracture, other site, initial encounter for fracture: Secondary | ICD-10-CM | POA: Diagnosis present

## 2012-06-23 DIAGNOSIS — M4310 Spondylolisthesis, site unspecified: Secondary | ICD-10-CM | POA: Diagnosis present

## 2012-06-23 DIAGNOSIS — K2970 Gastritis, unspecified, without bleeding: Secondary | ICD-10-CM

## 2012-06-23 DIAGNOSIS — M546 Pain in thoracic spine: Secondary | ICD-10-CM

## 2012-06-23 DIAGNOSIS — IMO0002 Reserved for concepts with insufficient information to code with codable children: Secondary | ICD-10-CM

## 2012-06-23 DIAGNOSIS — Z9861 Coronary angioplasty status: Secondary | ICD-10-CM

## 2012-06-23 DIAGNOSIS — M5126 Other intervertebral disc displacement, lumbar region: Principal | ICD-10-CM | POA: Diagnosis present

## 2012-06-23 DIAGNOSIS — E785 Hyperlipidemia, unspecified: Secondary | ICD-10-CM | POA: Diagnosis present

## 2012-06-23 DIAGNOSIS — G8929 Other chronic pain: Secondary | ICD-10-CM | POA: Diagnosis present

## 2012-06-23 DIAGNOSIS — M5430 Sciatica, unspecified side: Secondary | ICD-10-CM | POA: Diagnosis present

## 2012-06-23 DIAGNOSIS — M5137 Other intervertebral disc degeneration, lumbosacral region: Secondary | ICD-10-CM | POA: Diagnosis present

## 2012-06-23 DIAGNOSIS — M5440 Lumbago with sciatica, unspecified side: Secondary | ICD-10-CM

## 2012-06-23 DIAGNOSIS — K5730 Diverticulosis of large intestine without perforation or abscess without bleeding: Secondary | ICD-10-CM | POA: Diagnosis present

## 2012-06-23 DIAGNOSIS — I10 Essential (primary) hypertension: Secondary | ICD-10-CM | POA: Diagnosis present

## 2012-06-23 DIAGNOSIS — Z79899 Other long term (current) drug therapy: Secondary | ICD-10-CM

## 2012-06-23 DIAGNOSIS — M810 Age-related osteoporosis without current pathological fracture: Secondary | ICD-10-CM | POA: Diagnosis present

## 2012-06-23 DIAGNOSIS — J45909 Unspecified asthma, uncomplicated: Secondary | ICD-10-CM | POA: Diagnosis present

## 2012-06-23 HISTORY — DX: Collapsed vertebra, not elsewhere classified, site unspecified, initial encounter for fracture: M48.50XA

## 2012-06-23 LAB — BASIC METABOLIC PANEL
CO2: 27 mEq/L (ref 19–32)
Calcium: 9.5 mg/dL (ref 8.4–10.5)
Creatinine, Ser: 0.84 mg/dL (ref 0.50–1.10)
Glucose, Bld: 98 mg/dL (ref 70–99)

## 2012-06-23 LAB — CBC
MCH: 30.6 pg (ref 26.0–34.0)
MCV: 89.7 fL (ref 78.0–100.0)
Platelets: 367 10*3/uL (ref 150–400)
RDW: 14.5 % (ref 11.5–15.5)
WBC: 12.3 10*3/uL — ABNORMAL HIGH (ref 4.0–10.5)

## 2012-06-23 MED ORDER — VITAMIN D3 25 MCG (1000 UNIT) PO TABS
6000.0000 [IU] | ORAL_TABLET | Freq: Every day | ORAL | Status: DC
  Administered 2012-06-23 – 2012-06-28 (×5): 6000 [IU] via ORAL
  Filled 2012-06-23 (×6): qty 6

## 2012-06-23 MED ORDER — SENNA 8.6 MG PO TABS
1.0000 | ORAL_TABLET | Freq: Two times a day (BID) | ORAL | Status: DC
  Administered 2012-06-23 – 2012-06-28 (×9): 8.6 mg via ORAL
  Filled 2012-06-23 (×12): qty 1

## 2012-06-23 MED ORDER — HYDROMORPHONE HCL PF 1 MG/ML IJ SOLN
1.0000 mg | INTRAMUSCULAR | Status: AC | PRN
  Administered 2012-06-23 – 2012-06-24 (×3): 1 mg via INTRAVENOUS
  Filled 2012-06-23 (×3): qty 1

## 2012-06-23 MED ORDER — LORAZEPAM 2 MG/ML IJ SOLN
1.0000 mg | Freq: Once | INTRAMUSCULAR | Status: AC
  Administered 2012-06-23: 1 mg via INTRAMUSCULAR

## 2012-06-23 MED ORDER — CALCIUM CARBONATE ANTACID 500 MG PO CHEW
1.0000 | CHEWABLE_TABLET | Freq: Every day | ORAL | Status: DC
  Administered 2012-06-24 – 2012-06-28 (×5): 200 mg via ORAL
  Filled 2012-06-23 (×6): qty 1

## 2012-06-23 MED ORDER — OXYCODONE HCL 5 MG PO TABS
5.0000 mg | ORAL_TABLET | ORAL | Status: DC | PRN
  Administered 2012-06-24 – 2012-06-28 (×8): 5 mg via ORAL
  Filled 2012-06-23 (×8): qty 1

## 2012-06-23 MED ORDER — ALUM & MAG HYDROXIDE-SIMETH 200-200-20 MG/5ML PO SUSP: 30.0000 mL | Freq: Four times a day (QID) | ORAL | Status: DC | PRN

## 2012-06-23 MED ORDER — MOMETASONE FURO-FORMOTEROL FUM 200-5 MCG/ACT IN AERO
2.0000 | INHALATION_SPRAY | Freq: Two times a day (BID) | RESPIRATORY_TRACT | Status: DC
  Administered 2012-06-23 – 2012-06-28 (×8): 2 via RESPIRATORY_TRACT
  Filled 2012-06-23 (×2): qty 8.8

## 2012-06-23 MED ORDER — GABAPENTIN 300 MG PO CAPS
300.0000 mg | ORAL_CAPSULE | Freq: Every day | ORAL | Status: DC
  Administered 2012-06-23 – 2012-06-24 (×2): 300 mg via ORAL
  Filled 2012-06-23 (×3): qty 1

## 2012-06-23 MED ORDER — ALPRAZOLAM 0.5 MG PO TABS
0.5000 mg | ORAL_TABLET | Freq: Every evening | ORAL | Status: DC | PRN
  Administered 2012-06-24: 0.5 mg via ORAL
  Filled 2012-06-23: qty 1

## 2012-06-23 MED ORDER — DOCUSATE SODIUM 100 MG PO CAPS
100.0000 mg | ORAL_CAPSULE | Freq: Two times a day (BID) | ORAL | Status: DC
  Administered 2012-06-23 – 2012-06-28 (×9): 100 mg via ORAL
  Filled 2012-06-23 (×12): qty 1

## 2012-06-23 MED ORDER — ONDANSETRON HCL 4 MG/2ML IJ SOLN: 4.0000 mg | Freq: Three times a day (TID) | INTRAMUSCULAR | Status: AC | PRN

## 2012-06-23 MED ORDER — HYDROMORPHONE HCL PF 1 MG/ML IJ SOLN
0.5000 mg | Freq: Once | INTRAMUSCULAR | Status: DC
  Filled 2012-06-23: qty 1

## 2012-06-23 MED ORDER — ENOXAPARIN SODIUM 40 MG/0.4ML ~~LOC~~ SOLN
40.0000 mg | SUBCUTANEOUS | Status: DC
  Administered 2012-06-23 – 2012-06-25 (×3): 40 mg via SUBCUTANEOUS
  Filled 2012-06-23 (×3): qty 0.4

## 2012-06-23 MED ORDER — ACETAMINOPHEN 650 MG RE SUPP: 650.0000 mg | Freq: Four times a day (QID) | RECTAL | Status: DC | PRN

## 2012-06-23 MED ORDER — CALCIUM CARBONATE ANTACID 500 MG PO CHEW: 1.0000 | CHEWABLE_TABLET | Freq: Every day | ORAL | Status: DC

## 2012-06-23 MED ORDER — VITAMIN D 1000 UNITS PO CAPS: 6000.0000 [IU] | ORAL_CAPSULE | Freq: Every day | ORAL | Status: DC

## 2012-06-23 MED ORDER — SIMVASTATIN 20 MG PO TABS
20.0000 mg | ORAL_TABLET | Freq: Every day | ORAL | Status: DC
  Administered 2012-06-23 – 2012-06-27 (×4): 20 mg via ORAL
  Filled 2012-06-23 (×6): qty 1

## 2012-06-23 MED ORDER — IPRATROPIUM BROMIDE 0.02 % IN SOLN: 0.5000 mg | RESPIRATORY_TRACT | Status: DC | PRN

## 2012-06-23 MED ORDER — NAPROXEN 500 MG PO TABS
500.0000 mg | ORAL_TABLET | Freq: Every day | ORAL | Status: DC
  Administered 2012-06-23 – 2012-06-28 (×5): 500 mg via ORAL
  Filled 2012-06-23 (×6): qty 1

## 2012-06-23 MED ORDER — AMLODIPINE BESYLATE 10 MG PO TABS
10.0000 mg | ORAL_TABLET | Freq: Every day | ORAL | Status: DC
  Administered 2012-06-23 – 2012-06-28 (×5): 10 mg via ORAL
  Filled 2012-06-23 (×9): qty 1

## 2012-06-23 MED ORDER — ONDANSETRON HCL 4 MG/2ML IJ SOLN
4.0000 mg | Freq: Once | INTRAMUSCULAR | Status: DC
  Filled 2012-06-23: qty 2

## 2012-06-23 MED ORDER — HYDROCHLOROTHIAZIDE 25 MG PO TABS
25.0000 mg | ORAL_TABLET | Freq: Every day | ORAL | Status: DC
  Administered 2012-06-23 – 2012-06-28 (×5): 25 mg via ORAL
  Filled 2012-06-23 (×8): qty 1

## 2012-06-23 MED ORDER — SODIUM CHLORIDE 0.9 % IV SOLN
INTRAVENOUS | Status: DC
  Administered 2012-06-23 – 2012-06-27 (×5): via INTRAVENOUS

## 2012-06-23 MED ORDER — HYDROMORPHONE HCL PF 1 MG/ML IJ SOLN: 1.0000 mg | INTRAMUSCULAR | Status: DC | PRN

## 2012-06-23 MED ORDER — PANTOPRAZOLE SODIUM 40 MG PO TBEC
40.0000 mg | DELAYED_RELEASE_TABLET | Freq: Every day | ORAL | Status: DC
  Administered 2012-06-23 – 2012-06-28 (×5): 40 mg via ORAL
  Filled 2012-06-23 (×5): qty 1

## 2012-06-23 MED ORDER — ALBUTEROL SULFATE (5 MG/ML) 0.5% IN NEBU: 2.5000 mg | INHALATION_SOLUTION | RESPIRATORY_TRACT | Status: DC | PRN

## 2012-06-23 MED ORDER — LORAZEPAM 2 MG/ML IJ SOLN
1.0000 mg | Freq: Once | INTRAMUSCULAR | Status: DC
  Filled 2012-06-23: qty 1

## 2012-06-23 MED ORDER — METHOCARBAMOL 500 MG PO TABS
500.0000 mg | ORAL_TABLET | Freq: Four times a day (QID) | ORAL | Status: DC | PRN
  Administered 2012-06-26: 500 mg via ORAL
  Filled 2012-06-23: qty 1

## 2012-06-23 MED ORDER — HYDROMORPHONE HCL PF 2 MG/ML IJ SOLN
2.0000 mg | Freq: Once | INTRAMUSCULAR | Status: AC
  Administered 2012-06-23: 2 mg via INTRAMUSCULAR
  Filled 2012-06-23: qty 1

## 2012-06-23 MED ORDER — ACETAMINOPHEN 325 MG PO TABS: 650.0000 mg | ORAL_TABLET | Freq: Four times a day (QID) | ORAL | Status: DC | PRN

## 2012-06-23 MED ORDER — HYDROMORPHONE HCL PF 1 MG/ML IJ SOLN
1.0000 mg | Freq: Once | INTRAMUSCULAR | Status: AC
  Administered 2012-06-23: 1 mg via INTRAVENOUS
  Filled 2012-06-23: qty 1

## 2012-06-23 MED ORDER — IPRATROPIUM-ALBUTEROL 0.5-2.5 (3) MG/3ML IN SOLN: 3.0000 mL | RESPIRATORY_TRACT | Status: DC | PRN

## 2012-06-23 MED ORDER — IRBESARTAN 75 MG PO TABS
75.0000 mg | ORAL_TABLET | Freq: Every day | ORAL | Status: DC
  Administered 2012-06-23 – 2012-06-28 (×5): 75 mg via ORAL
  Filled 2012-06-23 (×6): qty 1

## 2012-06-23 NOTE — ED Notes (Signed)
IV team paged for access.  3 attempts made prior

## 2012-06-23 NOTE — ED Provider Notes (Signed)
Medical screening examination/treatment/procedure(s) were performed by non-physician practitioner and as supervising physician I was immediately available for consultation/collaboration.  Geza Beranek R. Lianah Peed, MD 06/23/12 1635 

## 2012-06-23 NOTE — H&P (Signed)
[facility] History and Physical  Lory Nowaczyk ZOX:[id number] DOB: [birth year] DOA: 06/23/2012  Referring physician: ED PCP: [name], MD  Specialists: Dr. Otelia Sergeant   Chief Complaint: back pain     HPI: [name] [name] is a 77 y.o. female with multiple medical problems presents back to the ED today less than 24 hrs after being discharged from the hospital with severe back pain radiating down her RLE. No numbness no paralysis. According to husband patient fell about 3 weeks ago and has been in pain ever since. MRI of the spine done during previous admission on 2/12 could not established that her L1 fracture was acute. On the MRI of the spine there was a disc fragment pushing on the L4 root.     Review of Systems: The patient denies anorexia, fever, weight loss,, vision loss, decreased hearing, hoarseness, chest pain, syncope, dyspnea on exertion, peripheral edema,  hemoptysis, abdominal pain, melena, hematochezia, severe indigestion/heartburn, hematuria, incontinence, genital sores, muscle weakness, suspicious skin lesions, transient blindness, depression, unusual weight change, abnormal bleeding, enlarged lymph nodes, angioedema, and breast masses.    Past Medical History  Diagnosis Date  . CAD (coronary artery disease)     LHC 2003 with 40% pLAD, 30% mLAD, 100% D1 (moderate vessel), 100%  D2 (small vessel), 95% mid small OM2.  Pt had PTCA to D1;  D2 and OM2 small vessels and tx medically;  Last Myoview (5/09):  EF 66%, anteroseptal/apical infarct with minimal peri-infarct ischemia.  No significant change from 11/06. Pt managed medically  . Recurrent aspiration bronchitis/pneumonia     PFTs 12/09 FVC 97%, FEV1 101%, ratio 77%, TLC 109%. No significant obstruction or restriction.   . Hypertension   . Hyperlipidemia   . Esophageal stricture   . Diverticulosis of colon   . Gastroesophageal reflux disease with hiatal hernia   . Iron deficiency anemia   . Gastritis   . GERD  (gastroesophageal reflux disease)   . Hiatal hernia   . Asthma   . Blood transfusion   . Cough   . Wheezing   . Weakness   . Trouble swallowing   . Abdominal pain   . Hiatal hernia   . Shortness of breath    Past Surgical History  Procedure Laterality Date  . Back surgery    . Cholecystectomy    . Bladder repair    . Hemorrhoid surgery    . Cholecystectomy      Patient unsure of date.  [name] Appendectomy      patient unsure of date  . Total abdominal hysterectomy  1975    partial  . Coronary angioplasty  11/16/2001  . Eye surgery  11/2000    bilateral cataracts with lens implant  . Hiatal hernia repair  06/03/2011    Procedure: LAPAROSCOPIC REPAIR OF HIATAL HERNIA;  Surgeon: Ernestene Mention, MD;  Location: [facility];  Service: General;  Laterality: N/A;  . Laparoscopic nissen fundoplication  06/03/2011    Procedure: LAPAROSCOPIC NISSEN FUNDOPLICATION;  Surgeon: Ernestene Mention, MD;  Location: [facility];  Service: General;  Laterality: N/A;   Social History:  reports that she has never smoked. She has never used smokeless tobacco. She reports that she does not drink alcohol or use illicit drugs. Lives at home with husband   Allergies  Allergen Reactions  . Aspartame Diarrhea and Nausea And Vomiting  . Daliresp (Roflumilast) Nausea And Vomiting    Family History  Problem Relation Age of Onset  . Cancer Mother  colon   . Colon cancer Mother 54  . Heart disease Father 43    heart attack     Prior to Admission medications   Medication Sig Start Date End Date Taking? Authorizing Provider  ALPRAZolam Prudy Feeler) 0.5 MG tablet Take 0.5 mg by mouth at bedtime as needed. For sleep   Yes Historical Provider, MD  amLODipine (NORVASC) 10 MG tablet Take 10 mg by mouth daily with breakfast.    Yes Historical Provider, MD  calcium carbonate (TUMS - DOSED IN MG ELEMENTAL CALCIUM) 500 MG chewable tablet Chew 1 tablet by mouth daily as needed. For heartburn   Yes Historical Provider, MD   Cholecalciferol (VITAMIN D) 1000 UNITS capsule Take 6,000 Units by mouth daily.    Yes Historical Provider, MD  CORAL CALCIUM PO Take 1 tablet by mouth daily.   Yes Historical Provider, MD  Cyanocobalamin (VITAMIN B 12 PO) Take 1 tablet by mouth daily.    Yes Historical Provider, MD  gabapentin (NEURONTIN) 300 MG capsule Take 300 mg by mouth at bedtime.   Yes Historical Provider, MD  hydrochlorothiazide 25 MG tablet Take 25 mg by mouth daily with breakfast.  09/11/10  Yes Historical Provider, MD  HYDROcodone-acetaminophen (NORCO/VICODIN) 5-325 MG per tablet Take 1-2 tablets by mouth every 6 (six) hours as needed. 06/22/12  Yes Inez Catalina, MD  ipratropium-albuterol (DUONEB) 0.5-2.5 (3) MG/3ML SOLN Take 3 mLs by nebulization 4 (four) times daily as needed. For shortness of breath 10/12/10  Yes Beatrice Lecher, PA  mometasone-formoterol (DULERA) 200-5 MCG/ACT AERO Inhale 2 puffs into the lungs 2 (two) times daily. 01/25/12  Yes Nyoka Cowden, MD  naproxen (NAPROSYN) 500 MG tablet Take 1 tablet (500 mg total) by mouth 2 (two) times daily with a meal. 06/13/12  Yes Hannah Muthersbaugh, PA-C  olmesartan (BENICAR) 20 MG tablet Take 1 tablet (20 mg total) by mouth daily. 01/25/12  Yes Nyoka Cowden, MD  omeprazole (PRILOSEC) 20 MG capsule Take 20 mg by mouth daily.   Yes Historical Provider, MD  pravastatin (PRAVACHOL) 40 MG tablet Take 40 mg by mouth at bedtime.    Yes Historical Provider, MD   Physical Exam: Filed Vitals:   06/23/12 1219 06/23/12 1450 06/23/12 1608  BP: 158/76 168/62 159/79  Pulse: 89 83   Temp: 97.7 F (36.5 C) 98.2 F (36.8 C)   TempSrc: Oral Oral   Resp: 18 20   SpO2: 99% 100%      General:  Alert, oriented x3  Eyes: perrla, eomi   ENT: clear pharynx   Neck: no JVD   Cardiovascular: rrr, no m,r,g   Respiratory: ctab   Abdomen: soft, Nt, bs present   Skin: mild facial erythema   Musculoskeletal: Lasegue positive, tender over L spine   Psychiatric: anxious ,  euthymic   Neurologic: cn 2-12 intact , strength 5/5 all 4   Labs on Admission:  Basic Metabolic Panel:  Recent Labs Lab 06/20/12 1100  NA 137  K 4.2  CL 99  CO2 25  GLUCOSE 141*  BUN 24*  CREATININE 0.87  CALCIUM 9.4   Liver Function Tests: No results found for this basename: AST, ALT, ALKPHOS, BILITOT, PROT, ALBUMIN,  in the last 168 hours No results found for this basename: LIPASE, AMYLASE,  in the last 168 hours No results found for this basename: AMMONIA,  in the last 168 hours CBC:  Recent Labs Lab 06/20/12 1100  WBC 10.3  HGB 14.6  HCT 43.4  MCV 89.5  PLT 411*   Cardiac Enzymes: No results found for this basename: CKTOTAL, CKMB, CKMBINDEX, TROPONINI,  in the last 168 hours  BNP (last 3 results) No results found for this basename: PROBNP,  in the last 8760 hours CBG: No results found for this basename: GLUCAP,  in the last 168 hours  Radiological Exams on Admission: No results found.  EKG: Independently reviewed.   Assessment/Plan Active Problems:   HYPERLIPIDEMIA   HYPERTENSION   CAD, UNSPECIFIED SITE   ASTHMA   DIVERTICULOSIS, COLON   Acute back pain with sciatica   Vertebral compression fracture   1. Acute on chronic back pain - ? Either from L1 compression fracture or from L4-L5 disc herniation, admit for pain control , IR for facet injection and consult Dr. Otelia Sergeant - spine surgery  2. CAD - stable , asymptomatic - continue statin , arb  3. Copd - stable , continue inhalers    Code Status: full  Family Communication: husband  Disposition Plan: 2 days   Rakesha Dalporto [facility] Pager [phone]  If 7PM-7AM, please contact night-coverage [email or web address] Password [facility] 06/23/2012, 4:51 PM

## 2012-06-23 NOTE — ED Provider Notes (Signed)
History     CSN: [id number]  Arrival date & time 06/23/12  1215   First MD Initiated Contact with Patient 06/23/12 1337      No chief complaint on file.   (Consider location/radiation/quality/duration/timing/severity/associated sxs/prior treatment) Patient is a 77 y.o. female presenting with back pain. The history is provided by the patient and medical records. No language interpreter was used.  Back Pain  History 5-year-old female past medical history of chronic back pain he was discharged yesterday from the hospital for pain control returns today with chief complaint of severe uncontrolled back pain and sciatic pain pattern down the right leg.  Patient has a known T12-L1 compression fracture, extruded disc disease at L4-L5 and foraminal narrowing.  She states that she has severe uncontrolled pain.  Patient was discharged on Norco 5-325.  She states that this is not touching her pain.  She has been unable to sleep.  She has been crying inconsolably.  Unable to get to the bathroom without help but has urinated on herself several times.  Patient states that she has a followup appointment with Dr. Bing Matter cut in March but does not feel that she will be able to make it that long as her pain is excruciating. Denies weakness, loss of bowel/ function overflow incontinence or saddle anesthesia. Denies neck stiffness, headache, rash.  Denies fever or recent procedures to back.  Past Medical History  Diagnosis Date  . CAD (coronary artery disease)     LHC 2003 with 40% pLAD, 30% mLAD, 100% D1 (moderate vessel), 100%  D2 (small vessel), 95% mid small OM2.  Pt had PTCA to D1;  D2 and OM2 small vessels and tx medically;  Last Myoview (5/09):  EF 66%, anteroseptal/apical infarct with minimal peri-infarct ischemia.  No significant change from 11/06. Pt managed medically  . Recurrent aspiration bronchitis/pneumonia     PFTs 12/09 FVC 97%, FEV1 101%, ratio 77%, TLC 109%. No significant obstruction or  restriction.   . Hypertension   . Hyperlipidemia   . Esophageal stricture   . Diverticulosis of colon   . Gastroesophageal reflux disease with hiatal hernia   . Iron deficiency anemia   . Gastritis   . GERD (gastroesophageal reflux disease)   . Hiatal hernia   . Asthma   . Blood transfusion   . Cough   . Wheezing   . Weakness   . Trouble swallowing   . Abdominal pain   . Hiatal hernia   . Shortness of breath     Past Surgical History  Procedure Laterality Date  . Back surgery    . Cholecystectomy    . Bladder repair    . Hemorrhoid surgery    . Cholecystectomy      Patient unsure of date.  [name] Appendectomy      patient unsure of date  . Total abdominal hysterectomy  1975    partial  . Coronary angioplasty  11/16/2001  . Eye surgery  11/2000    bilateral cataracts with lens implant  . Hiatal hernia repair  06/03/2011    Procedure: LAPAROSCOPIC REPAIR OF HIATAL HERNIA;  Surgeon: Ernestene Mention, MD;  Location: [facility];  Service: General;  Laterality: N/A;  . Laparoscopic nissen fundoplication  06/03/2011    Procedure: LAPAROSCOPIC NISSEN FUNDOPLICATION;  Surgeon: Ernestene Mention, MD;  Location: [facility];  Service: General;  Laterality: N/A;    Family History  Problem Relation Age of Onset  . Cancer Mother     colon   .  Colon cancer Mother 19  . Heart disease Father 4    heart attack    History  Substance Use Topics  . Smoking status: Never Smoker   . Smokeless tobacco: Never Used  . Alcohol Use: No    OB History   Grav Para Term Preterm Abortions TAB SAB Ect Mult Living                  Review of Systems  Musculoskeletal: Positive for back pain.   Ten systems reviewed and are negative for acute change, except as noted in the HPI.   Allergies  Aspartame and Daliresp  Home Medications   Current Outpatient Rx  Name  Route  Sig  Dispense  Refill  . ALPRAZolam (XANAX) 0.5 MG tablet   Oral   Take 0.5 mg by mouth at bedtime as needed. For sleep          . amLODipine (NORVASC) 10 MG tablet   Oral   Take 10 mg by mouth daily with breakfast.          . calcium carbonate (TUMS - DOSED IN MG ELEMENTAL CALCIUM) 500 MG chewable tablet   Oral   Chew 1 tablet by mouth daily as needed. For heartburn         . Cholecalciferol (VITAMIN D) 1000 UNITS capsule   Oral   Take 6,000 Units by mouth daily.          [name] CORAL CALCIUM PO   Oral   Take 1 tablet by mouth daily.         . Cyanocobalamin (VITAMIN B 12 PO)   Oral   Take 1 tablet by mouth daily.          [name] gabapentin (NEURONTIN) 300 MG capsule   Oral   Take 300 mg by mouth at bedtime.         . hydrochlorothiazide 25 MG tablet   Oral   Take 25 mg by mouth daily with breakfast.          . HYDROcodone-acetaminophen (NORCO/VICODIN) 5-325 MG per tablet   Oral   Take 1-2 tablets by mouth every 6 (six) hours as needed.   30 tablet   0   . ipratropium-albuterol (DUONEB) 0.5-2.5 (3) MG/3ML SOLN   Nebulization   Take 3 mLs by nebulization 4 (four) times daily as needed. For shortness of breath   360 mL      . mometasone-formoterol (DULERA) 200-5 MCG/ACT AERO   Inhalation   Inhale 2 puffs into the lungs 2 (two) times daily.         . naproxen (NAPROSYN) 500 MG tablet   Oral   Take 1 tablet (500 mg total) by mouth 2 (two) times daily with a meal.   30 tablet   0   . olmesartan (BENICAR) 20 MG tablet   Oral   Take 1 tablet (20 mg total) by mouth daily.         [name] omeprazole (PRILOSEC) 20 MG capsule   Oral   Take 20 mg by mouth daily.         . pravastatin (PRAVACHOL) 40 MG tablet   Oral   Take 40 mg by mouth at bedtime.            BP 168/62  Pulse 83  Temp(Src) 98.2 F (36.8 C) (Oral)  Resp 20  SpO2 100%  Physical Exam  Physical Exam  Nursing note and vitals reviewed. Constitutional: Elderly female.  She is extremely tearful and appears to be in excruciating pain. HENT:  Head: Normocephalic and atraumatic.  Eyes: Conjunctivae normal and  EOM are normal. Pupils are equal, round, and reactive to light. No scleral icterus.  Neck: Normal range of motion.  Cardiovascular: Normal rate, regular rhythm and normal heart sounds.  Exam reveals no gallop and no friction rub.   No murmur heard. Pulmonary/Chest: Effort normal and breath sounds normal. No respiratory distress.  Abdominal: Soft. Bowel sounds are normal. She exhibits no distension and no mass. There is no tenderness. There is no guarding.  Musculoskeletal: Exquisitely TTP right hip and lumbar spine. Patient ambulatory with assistance.  Neurological: She is alert and oriented to person, place, and time.  Skin: Skin is warm and dry. She is not diaphoretic.    ED Course  Procedures (including critical care time)  Labs Reviewed - No data to display No results found.   1. Vertebral compression fracture       MDM  3:26 PM Patient with severe uncontrolled sciatic pain and known fractures.  Beginning pain control with IM Dilaudid-HP and Ativan.  IV team has been called to access the patient for better pain control.  Do not feel that labs are necessary at this point since patient has no complaints of urinary symptoms, and nothing has changed except that her pain is uncontrolled on medications at home.  We'll initiate pain control here in the ED.   4:26 PM Spoke with Dr. Lavera Guise who has agreed to admit the patient.  She has not had a surgical consult.  Patient is beginning to feel better with pain medications. The patient appears reasonably stabilized for admission considering the current resources, flow, and capabilities available in the ED at this time, and I doubt any other [facility] requiring further screening and/or treatment in the ED prior to admission.        Arthor Captain, PA-C 06/23/12 1627

## 2012-06-23 NOTE — ED Notes (Signed)
Pt states she is not needing an additional dose of pain medication at the moment; stated she would let me know if she needed an additional dose; pain 6/10; pt no longer tearful and states she is much more comfortable;

## 2012-06-23 NOTE — ED Notes (Signed)
Pt was discharged from the hospital yesterday for ruptured disk, spur, and fracture in back and right knee pain with recent fall.  Pt went home on pain medications and it is not helping.  Pt states that she is to have surgery.  No incontinence of bowel or bladder.  Pt crying

## 2012-06-23 NOTE — Progress Notes (Signed)
NURSING PROGRESS NOTE  [name] [name] [id number] Admission Data: 06/23/2012 6:18 PM Attending Provider: Lorane Gell, MD [id number] DAVID, MD Code Status: full   [name] [name] is a 77 y.o. female patient admitted from ED  No acute distress noted.  No c/o shortness of breath, no c/o chest pain.    Blood pressure 137/76, pulse 92, temperature 97.9 F (36.6 C), temperature source Oral, resp. rate 20, height 5\' 5"  (1.651 m), weight 77.9 kg (171 lb 11.8 oz), SpO2 95.00%.   IV Fluids:  IV in place, occlusive dsg intact without redness, IV cath hand left, condition patent and no redness normal saline.   Allergies:  Aspartame and Daliresp  Past Medical History:   has a past medical history of CAD (coronary artery disease); Recurrent aspiration bronchitis/pneumonia; Hypertension; Hyperlipidemia; Esophageal stricture; Diverticulosis of colon; Gastroesophageal reflux disease with hiatal hernia; Iron deficiency anemia; Gastritis; GERD (gastroesophageal reflux disease); Hiatal hernia; Asthma; Blood transfusion; Cough; Wheezing; Weakness; Trouble swallowing; Abdominal pain; Hiatal hernia; and Shortness of breath.  Past Surgical History:   has past surgical history that includes Back surgery; cholecystectomy; Bladder repair; Hemorrhoid surgery; Cholecystectomy; Appendectomy; Total abdominal hysterectomy (1975); Coronary angioplasty (11/16/2001); Eye surgery (11/2000); Hiatal hernia repair (06/03/2011); and Laparoscopic Nissen fundoplication (06/03/2011).  Social History:   reports that she has never smoked. She has never used smokeless tobacco. She reports that she does not drink alcohol or use illicit drugs.  Skin: intact   Orientation to room, and floor completed with information packet given to patient/family. Admission INP armband ID verified with patient/family, and in place.   SR up x 2, fall assessment complete, with patient and family able to verbalize understanding of risk associated with  falls, and verbalized understanding to call for assistance before getting out of bed.   Call light within reach. Patient able to voice and demonstrate understanding of unit orientation instructions.   Will continue to evaluate and treat per MD orders.  Theta Leaf, Elmarie Mainland, RN

## 2012-06-24 DIAGNOSIS — K2990 Gastroduodenitis, unspecified, without bleeding: Secondary | ICD-10-CM

## 2012-06-24 DIAGNOSIS — I10 Essential (primary) hypertension: Secondary | ICD-10-CM

## 2012-06-24 DIAGNOSIS — I2510 Atherosclerotic heart disease of native coronary artery without angina pectoris: Secondary | ICD-10-CM

## 2012-06-24 MED ORDER — OXYCODONE HCL ER 15 MG PO T12A
15.0000 mg | EXTENDED_RELEASE_TABLET | Freq: Two times a day (BID) | ORAL | Status: DC
  Administered 2012-06-24 (×2): 15 mg via ORAL
  Filled 2012-06-24 (×2): qty 1

## 2012-06-24 MED ORDER — MORPHINE SULFATE 2 MG/ML IJ SOLN
1.0000 mg | INTRAMUSCULAR | Status: DC | PRN
  Administered 2012-06-24: 1 mg via INTRAVENOUS
  Filled 2012-06-24: qty 1

## 2012-06-24 MED ORDER — MORPHINE SULFATE 2 MG/ML IJ SOLN
2.0000 mg | INTRAMUSCULAR | Status: DC | PRN
  Administered 2012-06-24 – 2012-06-26 (×7): 2 mg via INTRAVENOUS
  Filled 2012-06-24 (×7): qty 1

## 2012-06-24 NOTE — Progress Notes (Signed)
PATIENT DETAILS Name: [name] [name] Age: 77 y.o. Sex: female Date of Birth: [birth year] Admit Date: 06/23/2012 Admitting Physician Sorin Luanne Bras, MD ZOX:WRUEAVW,[id number] DAVID, MD  Subjective: And severe pain-back pain radiating down her right lower extremity.  Assessment/Plan: Active Problems: Acute on chronic back pain  - MRI of lumbar spine without contrast done on 2/12- showed Extruded and sequestered appearing disc fragment in the right lateral recess-which could impact the L4 nerve root -Dr. Otelia Sergeant has been consulted, await formal evaluation - The meantime we'll attempt pain control, start long acting OxyContin 50 mg twice a day, use OxyIR and IV morphine for moderate and severe breakthrough pain.  Hypertension - Controlled with amlodipine, hydrochlorothiazide and Avapro    HYPERLIPIDEMIA - Continue with statins  History of coronary artery disease - Only with no chest pain or shortness of breath - stable  GERD - Continue PPI  History of bronchial asthma - Lungs currently clear - Continue with the [facility] - As needed nebulized bronchodilators   Disposition: Remain inpatient  DVT Prophylaxis: Prophylactic Lovenox   Code Status: Full code    Procedures:  None  CONSULTS:  orthopedic surgery  PHYSICAL EXAM: Vital signs in last 24 hours: Filed Vitals:   06/23/12 2029 06/23/12 2119 06/24/12 0432 06/24/12 0824  BP: 117/70  146/80   Pulse: 93  84   Temp: 97.9 F (36.6 C)  98.7 F (37.1 C)   TempSrc: Oral  Oral   Resp: 20  18   Height:      Weight:      SpO2: 95% 98% 95% 96%    Weight change:  Body mass index is 28.58 kg/(m^2).   Gen Exam: Awake and alert with clear speech.   Neck: Supple, No JVD.   Chest: B/L Clear.   CVS: S1 S2 Regular, no murmurs.  Abdomen: soft, BS +, non tender, non distended.  Extremities: no edema, lower extremities warm to touch. Neurologic: Non Focal.   Skin: No Rash.   Wounds: N/A.   Intake/Output from previous  day:  Intake/Output Summary (Last 24 hours) at 06/24/12 1020 Last data filed at 06/24/12 0830  Gross per 24 hour  Intake  867.5 ml  Output      0 ml  Net  867.5 ml     LAB RESULTS: CBC  Recent Labs Lab 06/20/12 1100 06/23/12 1717  WBC 10.3 12.3*  HGB 14.6 14.8  HCT 43.4 43.4  PLT 411* 367  MCV 89.5 89.7  MCH 30.1 30.6  MCHC 33.6 34.1  RDW 14.1 14.5    Chemistries   Recent Labs Lab 06/20/12 1100 06/23/12 1717  NA 137 135  K 4.2 3.7  CL 99 100  CO2 25 27  GLUCOSE 141* 98  BUN 24* 23  CREATININE 0.87 0.84  CALCIUM 9.4 9.5    CBG: No results found for this basename: GLUCAP,  in the last 168 hours  GFR Estimated Creatinine Clearance: 60.7 ml/min (by C-G formula based on Cr of 0.84).  Coagulation profile No results found for this basename: INR, PROTIME,  in the last 168 hours  Cardiac Enzymes No results found for this basename: CK, CKMB, TROPONINI, MYOGLOBIN,  in the last 168 hours  No components found with this basename: POCBNP,  No results found for this basename: DDIMER,  in the last 72 hours No results found for this basename: HGBA1C,  in the last 72 hours No results found for this basename: CHOL, HDL, LDLCALC, TRIG, CHOLHDL, LDLDIRECT,  in the last  72 hours No results found for this basename: TSH, T4TOTAL, FREET3, T3FREE, THYROIDAB,  in the last 72 hours No results found for this basename: VITAMINB12, FOLATE, FERRITIN, TIBC, IRON, RETICCTPCT,  in the last 72 hours No results found for this basename: LIPASE, AMYLASE,  in the last 72 hours  Urine Studies No results found for this basename: UACOL, UAPR, USPG, UPH, UTP, UGL, UKET, UBIL, UHGB, UNIT, UROB, ULEU, UEPI, UWBC, URBC, UBAC, CAST, CRYS, UCOM, BILUA,  in the last 72 hours  MICROBIOLOGY: No results found for this or any previous visit (from the past 240 hour(s)).  RADIOLOGY STUDIES/RESULTS: Ct Abdomen Pelvis Wo Contrast  06/20/2012  *RADIOLOGY REPORT*  Clinical Data: Back pain.  Fall.  CT  ABDOMEN AND PELVIS WITHOUT CONTRAST  Technique:  Multidetector CT imaging of the abdomen and pelvis was performed following the standard protocol without intravenous contrast.  Comparison: 04/26/2011  Findings: Hiatal hernia wrap noted above the diaphragm.  The visualized portion of the liver, spleen, pancreas, and adrenal glands appear unremarkable in noncontrast CT appearance.  The kidneys appear unremarkable, as do the proximal ureters.  Gallbladder surgically absent.  Common bile duct approximately 1.1 cm in diameter.  No pathologic retroperitoneal or porta hepatis adenopathy is identified.  Aortoiliac atherosclerotic calcification is present.  Borderline prominence stool noted in the colon, particularly the cecum.  Appendix absent.  Uterus absent.  The ovaries unremarkable.  Urinary bladder appears unremarkable.  No significant change in the L1 compression fracture.  Grade 1 anterolisthesis at L4-5 is again noted with degenerative facet arthropathy, but without pars defects.  Posterior decompression is again noted at the L5-S1 level.  Suspected disc bulge at L4-5, similar to prior.  IMPRESSION:  1.  Unchanged appearance of L1 compression fracture and lumbar spondylosis and degenerative disc disease. 2.  Borderline prominence of stool in the colon. 3.  Atherosclerosis. 4.  Hiatal hernia wrap noted above the diaphragm.   Original Report Authenticated By: Gaylyn Rong, M.D.    Dg Hip Complete Right  06/13/2012  *RADIOLOGY REPORT*  Clinical Data: Right hip pain.  RIGHT HIP - COMPLETE 2+ VIEW  Comparison: 06/04/2012.  Findings: No fracture or dislocation.  Mild subchondral sclerosis in the hips bilaterally.  Mild superior joint space narrowing in the left hip.  IMPRESSION:  1.  No acute findings. 2.  Mild degenerative changes in the left hip.   Original Report Authenticated By: Leanna Battles, M.D.    Dg Hip Complete Right  06/04/2012  *RADIOLOGY REPORT*  Clinical Data: Right hip pain  RIGHT HIP - COMPLETE  2+ VIEW  Comparison: 03/19/2008  Findings: No displaced fracture identified.  No dislocation.  No aggressive osseous lesions. Lower lumbar degenerative change. Overlying soft tissues unremarkable.  IMPRESSION: No displaced fracture identified. If clinical concern for fracture persists, MRI follow-up recommended (which has increased sensitivity for nondisplaced fracture).   Original Report Authenticated By: Jearld Lesch, M.D.    Dg Femur Right  06/19/2012  *RADIOLOGY REPORT*  Clinical Data: Status post multiple falls; right leg pain.  RIGHT FEMUR - 2 VIEW  Comparison: Right hip radiographs performed 06/13/2012  Findings: The right femur appears intact.  There is no evidence of fracture or dislocation.  The right femoral head remains seated at the acetabulum.  The right knee joint is grossly unremarkable in appearance.  No knee joint effusion is identified.  The visualized bowel gas pattern is grossly unremarkable.  No significant soft tissue abnormalities are seen on radiograph.  IMPRESSION: No evidence of  fracture or dislocation.   Original Report Authenticated By: Tonia Ghent, M.D.    Mr Lumbar Spine Wo Contrast  06/20/2012  *RADIOLOGY REPORT*  Clinical Data: Chronic back pain. History of prior surgery.  Right leg pain.  Status post fall  MRI LUMBAR SPINE WITHOUT CONTRAST  Technique:  Multiplanar and multiecho pulse sequences of the lumbar spine were obtained without intravenous contrast.  Comparison: CT abdomen and pelvis 06/20/2012 and 04/26/2011.  Findings: The patient has 0.5 cm of anterolisthesis of L4 on L5 due to advanced facet degenerative disease.  Remote T12 and L1 compression fractures are identified.  Vertebral body height loss of T12 is estimated 20% centrally.  More severe compression fracture L1 is also seen with some bony retropulsion as described below.  Vertebral body height is otherwise maintained.  No worrisome marrow lesion is identified.  The conus medullaris is normal in signal  and position. Imaged intra-abdominal contents are unremarkable.  The T10-11 level is imaged in the sagittal plane only and negative.  T11-12:  Negative.  T12-L1:  Mild bony retropulsion off the superior plate of L1 is seen but the central spinal canal and neural foramina are widely patent.  L1-2:  Minimal disc bulge without central canal or foraminal narrowing.  L2-3:  Negative.  L3-4:  There is ligamentum flavum thickening and a shallow disc bulge with moderate facet degenerative change.  Central canal is mildly narrowed.  Foramina are open.  L4-5:  Advanced facet degenerative change is present.  The disc is uncovered with a bulge.  There is a superimposed right lateral recess cephalad extrusion with a sequestered fragment extending just into the right foramen.  The disc fragment measures 0.8 cm cranial-caudal by 0.6 cm AP by 0.8 cm transverse and could impact the descending and exiting right L4 root.  The central canal is only mildly narrowed.  Mild to moderate left foraminal narrowing is identified.  L5-S1:  Small disc osteophyte complex.  The patient is status post posterior decompression.  Facet arthropathy noted.  Central canal is open.  Foramina are mildly narrowed.  IMPRESSION:  1.  Extruded and sequestered appearing disc fragment in the right lateral recess just cephalad to the L4-5 disc interspace could impact the descending and exiting right L4 root. 2.  Advanced facet arthropathy at L4-5 results in 0.5 cm of anterolisthesis.  Central canal is mildly narrowed this level. 3.  Remote T12 and L1 compression fractures. 4.  Status post posterior decompression L5-S1.  Central canal is open.  Foramina patent.   Original Report Authenticated By: Holley Dexter, M.D.    Dg Knee Complete 4 Views Right  06/13/2012  *RADIOLOGY REPORT*  Clinical Data: Right knee pain.  RIGHT KNEE - COMPLETE 4+ VIEW  Comparison: No joint effusion or fracture.  Chondrocalcinosis is seen in the lateral compartment.  Findings: No  joint effusion or fracture.  Chondrocalcinosis is seen in the lateral compartment.  IMPRESSION: 1.  No acute findings. 2.   Chondrocalcinosis in the lateral compartment of the knee.   Original Report Authenticated By: Leanna Battles, M.D.     MEDICATIONS: Scheduled Meds: . amLODipine  10 mg Oral Q breakfast  . calcium carbonate  1 tablet Oral Daily  . cholecalciferol  6,000 Units Oral Daily  . docusate sodium  100 mg Oral BID  . enoxaparin (LOVENOX) injection  40 mg Subcutaneous Q24H  . gabapentin  300 mg Oral QHS  . hydrochlorothiazide  25 mg Oral Q breakfast  . irbesartan  75 mg Oral  Daily  . mometasone-formoterol  2 puff Inhalation BID  . naproxen  500 mg Oral Daily  . OxyCODONE  15 mg Oral Q12H  . pantoprazole  40 mg Oral Daily  . senna  1 tablet Oral BID  . simvastatin  20 mg Oral q1800   Continuous Infusions: . sodium chloride 50 mL/hr at 06/23/12 1733   PRN Meds:.acetaminophen, acetaminophen, albuterol, ALPRAZolam, alum & mag hydroxide-simeth, ipratropium, methocarbamol, morphine injection, oxyCODONE  Antibiotics: Anti-infectives   None       Jeoffrey Massed, MD  [facility] Pager:[phone]  If 7PM-7AM, please contact night-coverage [email or web address] Password [email or web address] 06/24/2012, 10:20 AM   LOS: 1 day

## 2012-06-24 NOTE — Progress Notes (Signed)
PT Cancel Note  MD order received.  Ortho consult is in place for Dr. Otelia Sergeant.  Spoke with pt this PM.  She wants to speak with Dr. Otelia Sergeant before proceeding with PT eval.  She reports currently she is getting OOB with nursing assist, stand-pivot transfers to [facility].  PT to f/u tomorrow.  Aida Raider, PT  Office # [phone] Pager [phone]

## 2012-06-25 ENCOUNTER — Inpatient Hospital Stay (HOSPITAL_COMMUNITY): Payer: Medicare Other

## 2012-06-25 ENCOUNTER — Encounter (HOSPITAL_COMMUNITY): Payer: Self-pay | Admitting: Radiology

## 2012-06-25 DIAGNOSIS — M5126 Other intervertebral disc displacement, lumbar region: Secondary | ICD-10-CM | POA: Diagnosis present

## 2012-06-25 DIAGNOSIS — E785 Hyperlipidemia, unspecified: Secondary | ICD-10-CM

## 2012-06-25 LAB — CBC
MCH: 30.2 pg (ref 26.0–34.0)
Platelets: 343 10*3/uL (ref 150–400)
RBC: 4.5 MIL/uL (ref 3.87–5.11)

## 2012-06-25 LAB — URINALYSIS, ROUTINE W REFLEX MICROSCOPIC
Ketones, ur: NEGATIVE mg/dL
Leukocytes, UA: NEGATIVE
Nitrite: NEGATIVE
Protein, ur: NEGATIVE mg/dL
Urobilinogen, UA: 0.2 mg/dL (ref 0.0–1.0)

## 2012-06-25 LAB — PROTIME-INR
INR: 0.87 (ref 0.00–1.49)
Prothrombin Time: 11.8 seconds (ref 11.6–15.2)

## 2012-06-25 MED ORDER — OXYCODONE HCL ER 15 MG PO T12A
30.0000 mg | EXTENDED_RELEASE_TABLET | Freq: Two times a day (BID) | ORAL | Status: DC
  Administered 2012-06-25 – 2012-06-28 (×6): 30 mg via ORAL
  Filled 2012-06-25 (×6): qty 2

## 2012-06-25 MED ORDER — CHLORHEXIDINE GLUCONATE 4 % EX LIQD
60.0000 mL | Freq: Once | CUTANEOUS | Status: AC
  Administered 2012-06-26: 4 via TOPICAL
  Filled 2012-06-25: qty 60

## 2012-06-25 MED ORDER — CEFAZOLIN SODIUM 10 G IJ SOLR
3.0000 g | INTRAMUSCULAR | Status: DC
  Filled 2012-06-25: qty 3000

## 2012-06-25 MED ORDER — GABAPENTIN 300 MG PO CAPS
300.0000 mg | ORAL_CAPSULE | Freq: Two times a day (BID) | ORAL | Status: DC
  Administered 2012-06-25 – 2012-06-28 (×6): 300 mg via ORAL
  Filled 2012-06-25 (×8): qty 1

## 2012-06-25 NOTE — Consult Note (Signed)
Reason for Consult:Right leg pain and weakness.  Referring Physician: Dr. Lavera Guise Consulting Physician:NITKA,JAMES E MD  Orthopedic Diagnosis: 1)Right L4-5 focal foramenal herniated disc with L4 nerve compression. 2)L4-5 Chronic Grade 1 degenerative spondylolisthesis. 3)Degenerative Disc Disease, L4-5 and L5-S1, history of previous L5-S1 laminectomy. 4)Old T12 and L1 compression fracture unchanged. 5) Mild osteoporosis.  ZOX:WRUEAVW Bubeck is an 77 y.o. female with 9 day history of severe low back pain with radiation into the right  Hip and groin and right thigh to the knee and over the right anterior shin. Pain began after a fall while at her  Daughter's  [facility] 06/16/2012. She is experiencing difficulty standing and walking due to right leg pain and Right leg weakness. No bowel or bladder symptoms. Admitted to [facility] 06/19/2012 with worsening right leg pain And underwent MRI showing right sided L4-5 HNP. Discharged on 2/14 but returned to the hospital with intractable Right lower extremity pain. Unable to find comfortable position either lying or standing. Pain not controlled on Oral narcotic.Readmitted 2/15 for pain controlled. A consult was requested. Patient requested evaluation by [facility].    Past Medical History  Diagnosis Date  . CAD (coronary artery disease)     LHC 2003 with 40% pLAD, 30% mLAD, 100% D1 (moderate vessel), 100%  D2 (small vessel), 95% mid small OM2.  Pt had PTCA to D1;  D2 and OM2 small vessels and tx medically;  Last Myoview (5/09):  EF 66%, anteroseptal/apical infarct with minimal peri-infarct ischemia.  No significant change from 11/06. Pt managed medically  . Recurrent aspiration bronchitis/pneumonia     PFTs 12/09 FVC 97%, FEV1 101%, ratio 77%, TLC 109%. No significant obstruction or restriction.   . Hypertension   . Hyperlipidemia   . Esophageal stricture   . Diverticulosis of colon   . Gastroesophageal reflux disease with hiatal hernia   . Iron deficiency  anemia   . Gastritis   . GERD (gastroesophageal reflux disease)   . Hiatal hernia   . Asthma   . Blood transfusion   . Cough   . Wheezing   . Weakness   . Trouble swallowing   . Abdominal pain   . Hiatal hernia   . Shortness of breath     Past Surgical History  Procedure Laterality Date  . Back surgery    . Cholecystectomy    . Bladder repair    . Hemorrhoid surgery    . Cholecystectomy      Patient unsure of date.  [name] Appendectomy      patient unsure of date  . Total abdominal hysterectomy  1975    partial  . Coronary angioplasty  11/16/2001  . Eye surgery  11/2000    bilateral cataracts with lens implant  . Hiatal hernia repair  06/03/2011    Procedure: LAPAROSCOPIC REPAIR OF HIATAL HERNIA;  Surgeon: Ernestene Mention, MD;  Location: [facility]S;  Service: General;  Laterality: N/A;  . Laparoscopic nissen fundoplication  06/03/2011    Procedure: LAPAROSCOPIC NISSEN FUNDOPLICATION;  Surgeon: Ernestene Mention, MD;  Location: [facility]S;  Service: General;  Laterality: N/A;    Family History  Problem Relation Age of Onset  . Cancer Mother     colon   . Colon cancer Mother 22  . Heart disease Father 8    heart attack    Social History:  reports that she has never smoked. She has never used smokeless tobacco. She reports that she does not drink alcohol or use illicit drugs.  Allergies:  Allergies  Allergen Reactions  . Aspartame Diarrhea and Nausea And Vomiting  . Daliresp (Roflumilast) Nausea And Vomiting    Medications:  Prior to Admission:  Prescriptions prior to admission  Medication Sig Dispense Refill  . ALPRAZolam (XANAX) 0.5 MG tablet Take 0.5 mg by mouth at bedtime as needed. For sleep      . amLODipine (NORVASC) 10 MG tablet Take 10 mg by mouth daily with breakfast.       . calcium carbonate (TUMS - DOSED IN MG ELEMENTAL CALCIUM) 500 MG chewable tablet Chew 1 tablet by mouth daily as needed. For heartburn      . Cholecalciferol (VITAMIN D) 1000 UNITS capsule  Take 6,000 Units by mouth daily.       [name] CORAL CALCIUM PO Take 1 tablet by mouth daily.      . Cyanocobalamin (VITAMIN B 12 PO) Take 1 tablet by mouth daily.       [name] gabapentin (NEURONTIN) 300 MG capsule Take 300 mg by mouth at bedtime.      . hydrochlorothiazide 25 MG tablet Take 25 mg by mouth daily with breakfast.       . HYDROcodone-acetaminophen (NORCO/VICODIN) 5-325 MG per tablet Take 1-2 tablets by mouth every 6 (six) hours as needed.  30 tablet  0  . ipratropium-albuterol (DUONEB) 0.5-2.5 (3) MG/3ML SOLN Take 3 mLs by nebulization 4 (four) times daily as needed. For shortness of breath  360 mL    . mometasone-formoterol (DULERA) 200-5 MCG/ACT AERO Inhale 2 puffs into the lungs 2 (two) times daily.      . naproxen (NAPROSYN) 500 MG tablet Take 1 tablet (500 mg total) by mouth 2 (two) times daily with a meal.  30 tablet  0  . olmesartan (BENICAR) 20 MG tablet Take 1 tablet (20 mg total) by mouth daily.      [name] omeprazole (PRILOSEC) 20 MG capsule Take 20 mg by mouth daily.      . pravastatin (PRAVACHOL) 40 MG tablet Take 40 mg by mouth at bedtime.         Results for orders placed during the hospital encounter of 06/23/12 (from the past 48 hour(s))  BASIC METABOLIC PANEL     Status: Abnormal   Collection Time    06/23/12  5:17 PM      Result Value Range   Sodium 135  135 - 145 mEq/L   Potassium 3.7  3.5 - 5.1 mEq/L   Chloride 100  96 - 112 mEq/L   CO2 27  19 - 32 mEq/L   Glucose, Bld 98  70 - 99 mg/dL   BUN 23  6 - 23 mg/dL   Creatinine, Ser 1.61  0.50 - 1.10 mg/dL   Calcium 9.5  8.4 - 09.6 mg/dL   GFR calc non Af Amer 67 (*) >90 mL/min   GFR calc Af Amer 77 (*) >90 mL/min   Comment:            The eGFR has been calculated     using the CKD EPI equation.     This calculation has not been     validated in all clinical     situations.     eGFR's persistently     <90 mL/min signify     possible Chronic Kidney Disease.  CBC     Status: Abnormal   Collection Time    06/23/12   5:17 PM      Result Value Range   WBC  12.3 (*) 4.0 - 10.5 K/uL   RBC 4.84  3.87 - 5.11 MIL/uL   Hemoglobin 14.8  12.0 - 15.0 g/dL   HCT 16.1  09.6 - 04.5 %   MCV 89.7  78.0 - 100.0 fL   MCH 30.6  26.0 - 34.0 pg   MCHC 34.1  30.0 - 36.0 g/dL   RDW 40.9  81.1 - 91.4 %   Platelets 367  150 - 400 K/uL  CBC     Status: Abnormal   Collection Time    06/25/12  9:35 AM      Result Value Range   WBC 13.0 (*) 4.0 - 10.5 K/uL   RBC 4.50  3.87 - 5.11 MIL/uL   Hemoglobin 13.6  12.0 - 15.0 g/dL   HCT 78.2  95.6 - 21.3 %   MCV 90.9  78.0 - 100.0 fL   MCH 30.2  26.0 - 34.0 pg   MCHC 33.3  30.0 - 36.0 g/dL   RDW 08.6  57.8 - 46.9 %   Platelets 343  150 - 400 K/uL  PROTIME-INR     Status: None   Collection Time    06/25/12  9:35 AM      Result Value Range   Prothrombin Time 11.8  11.6 - 15.2 seconds   INR 0.87  0.00 - 1.49  APTT     Status: None   Collection Time    06/25/12  9:35 AM      Result Value Range   aPTT 26  24 - 37 seconds    No results found.   Blood pressure 150/83, pulse 80, temperature 98.7 F (37.1 C), temperature source Oral, resp. rate 18, height 5\' 5"  (1.651 m), weight 77.9 kg (171 lb 11.8 oz), SpO2 95.00%.   Orthopaedic Exam:Awake alert and Oriented x 4. Lying in her hospital bed Rm [id number]. Moderate pain.Right  Hip is flexed and the right knee flexed with the patient lying on her left side. Positive SLR right at 30 degrees. Positive Reverse SLR right. Right quadriceps strength is 4/5. Right foot dorsiflexion is 5-/5. Left leg is not painful. Old xrays dating to 2009 show a T12-L1 compression fracture with kyphosis and a bridging bone anteriorly. A grade 1  Anterolisthesis L4-5 degenerative type with DDD L5-S1. MRI shows extruded HNP right L4-5 with right L4 nerve compression. Grade 1 anterolisthesis L4-5. Old compression Deformity T12-L1 without acute edema change.  Assessment/Plan: Intractable sciatica right leg due to acute lumbar disc herniation Right  L4-5. Extruded HNP focal with right L4 compression. Chronic mild kyphosis T12-L1 due to old compression fractures with large bridging syndesmophyte anteriorly Stabilizing this area. Degenerative disc disease L5-S1, and L4-5. Previous history of L5-S1 laminectomy. Mild Osteoporosis.  Plan:As the disc is extruded at the L4-5 level the chance of improvement with conservative management is  Less. She has significant physical limitation due to right lumbar radiculopathy and is requiring IV and po narcotics. Unable to care for herself due to current levels of pain and physical weakness right leg. I discussed the problem  With Mrs.Dimperio and the forms of conservative management. As she is unable to stand or walk and has significant  Weakness with neural tension signs a microdiskectomy would provide her with a better 95% chance of relief of  Leg pain and improved function so that I have recommended this and discussed the risks and benefits, including Risks of infection, minimall risk of bleeking  And mimimal risk of nerve injury 1/10,000. She  wishes to proceed with Microdiskectomy right L4-5. I discussed also a risk that the anterolisthesis at L4-5 could worsen with microdiskectomy And that she could eventually require a fusion. This is a possibily even without surgery. Her xray suggest a moderate Degree of osteoporosis and with L5-S1 DDD she would need at the least a 2 level fusion if we were to consider this At this time. I think that microdiskectomy offers her a chance to avoid the larger fusion surgery with goof relief of  Pain and if the slip at L4-5 worsenes then that would need to be addressed at that point however with xrays showing Decreased bone density I'm not inclined to fuse her spine and would recommend a minimally invasive surgery (MIS) For the diskectomy right L4-5. She understands this.   NITKA,JAMES E 06/25/2012, 11:46 AM

## 2012-06-25 NOTE — Progress Notes (Signed)
PT/OT Cancellation/Discharge Note  Patient Details Name: [name] [name] MRN: [id number] DOB: Sep 19, 1935   Cancelled Treatment:    Reason Eval/Treat Not Completed: Patient not medically ready. Pt to have back surgery tomorrow. Will need re-order after surgery.   [name] 06/25/2012, 1:46 PM  [employer] PT [phone]

## 2012-06-25 NOTE — Progress Notes (Signed)
PATIENT DETAILS Name: [name] [name] Age: 77 y.o. Sex: female Date of Birth: February 12, 1936 Admit Date: 06/23/2012 Admitting Physician Sorin Luanne Bras, MD WUJ:[email or web address] DAVID, MD  Subjective: Pain slightly better-but still very uncomfortable  Assessment/Plan: Active Problems: Acute on chronic back pain  - MRI of lumbar spine without contrast done on 2/12- showed Extruded and sequestered appearing disc fragment in the right lateral recess-which could impact the L4 nerve root -Dr. Otelia Sergeant has been consulted, await formal evaluation -Appreciate IR input - Increase OxyContin 30 mg twice a day, use OxyIR and IV morphine for moderate and severe breakthrough pain.  Hypertension - Controlled with amlodipine, hydrochlorothiazide and Avapro    HYPERLIPIDEMIA - Continue with statins  History of coronary artery disease - Only with no chest pain or shortness of breath - stable  GERD - Continue PPI  History of bronchial asthma - Lungs currently clear - Continue with the [facility] - As needed nebulized bronchodilators   Disposition: Remain inpatient  DVT Prophylaxis: Prophylactic Lovenox   Code Status: Full code    Procedures:  None  CONSULTS:  orthopedic surgery  PHYSICAL EXAM: Vital signs in last 24 hours: Filed Vitals:   06/24/12 0824 06/24/12 1426 06/24/12 2131 06/25/12 0458  BP:  128/69 138/75 100/63  Pulse:  79 89 80  Temp:  98.5 F (36.9 C) 98.6 F (37 C) 98.7 F (37.1 C)  TempSrc:  Oral Oral Oral  Resp:  18 20 18   Height:      Weight:      SpO2: 96% 99% 95% 95%    Weight change:  Body mass index is 28.58 kg/(m^2).   Gen Exam: Awake and alert with clear speech.   Neck: Supple, No JVD.   Chest: B/L Clear.   CVS: S1 S2 Regular, no murmurs.  Abdomen: soft, BS +, non tender, non distended.  Extremities: no edema, lower extremities warm to touch. Neurologic: Non Focal.   Skin: No Rash.   Wounds: N/A.   Intake/Output from previous day:  Intake/Output  Summary (Last 24 hours) at 06/25/12 1030 Last data filed at 06/24/12 1900  Gross per 24 hour  Intake 1089.17 ml  Output    600 ml  Net 489.17 ml     LAB RESULTS: CBC  Recent Labs Lab 06/20/12 1100 06/23/12 1717  WBC 10.3 12.3*  HGB 14.6 14.8  HCT 43.4 43.4  PLT 411* 367  MCV 89.5 89.7  MCH 30.1 30.6  MCHC 33.6 34.1  RDW 14.1 14.5    Chemistries   Recent Labs Lab 06/20/12 1100 06/23/12 1717  NA 137 135  K 4.2 3.7  CL 99 100  CO2 25 27  GLUCOSE 141* 98  BUN 24* 23  CREATININE 0.87 0.84  CALCIUM 9.4 9.5    CBG: No results found for this basename: GLUCAP,  in the last 168 hours  GFR Estimated Creatinine Clearance: 60.7 ml/min (by C-G formula based on Cr of 0.84).  Coagulation profile No results found for this basename: INR, PROTIME,  in the last 168 hours  Cardiac Enzymes No results found for this basename: CK, CKMB, TROPONINI, MYOGLOBIN,  in the last 168 hours  No components found with this basename: POCBNP,  No results found for this basename: DDIMER,  in the last 72 hours No results found for this basename: HGBA1C,  in the last 72 hours No results found for this basename: CHOL, HDL, LDLCALC, TRIG, CHOLHDL, LDLDIRECT,  in the last 72 hours No results found for this basename: TSH, T4TOTAL,  FREET3, T3FREE, THYROIDAB,  in the last 72 hours No results found for this basename: VITAMINB12, FOLATE, FERRITIN, TIBC, IRON, RETICCTPCT,  in the last 72 hours No results found for this basename: LIPASE, AMYLASE,  in the last 72 hours  Urine Studies No results found for this basename: UACOL, UAPR, USPG, UPH, UTP, UGL, UKET, UBIL, UHGB, UNIT, UROB, ULEU, UEPI, UWBC, URBC, UBAC, CAST, CRYS, UCOM, BILUA,  in the last 72 hours  MICROBIOLOGY: No results found for this or any previous visit (from the past 240 hour(s)).  RADIOLOGY STUDIES/RESULTS: Ct Abdomen Pelvis Wo Contrast  06/20/2012  *RADIOLOGY REPORT*  Clinical Data: Back pain.  Fall.  CT ABDOMEN AND PELVIS  WITHOUT CONTRAST  Technique:  Multidetector CT imaging of the abdomen and pelvis was performed following the standard protocol without intravenous contrast.  Comparison: 04/26/2011  Findings: Hiatal hernia wrap noted above the diaphragm.  The visualized portion of the liver, spleen, pancreas, and adrenal glands appear unremarkable in noncontrast CT appearance.  The kidneys appear unremarkable, as do the proximal ureters.  Gallbladder surgically absent.  Common bile duct approximately 1.1 cm in diameter.  No pathologic retroperitoneal or porta hepatis adenopathy is identified.  Aortoiliac atherosclerotic calcification is present.  Borderline prominence stool noted in the colon, particularly the cecum.  Appendix absent.  Uterus absent.  The ovaries unremarkable.  Urinary bladder appears unremarkable.  No significant change in the L1 compression fracture.  Grade 1 anterolisthesis at L4-5 is again noted with degenerative facet arthropathy, but without pars defects.  Posterior decompression is again noted at the L5-S1 level.  Suspected disc bulge at L4-5, similar to prior.  IMPRESSION:  1.  Unchanged appearance of L1 compression fracture and lumbar spondylosis and degenerative disc disease. 2.  Borderline prominence of stool in the colon. 3.  Atherosclerosis. 4.  Hiatal hernia wrap noted above the diaphragm.   Original Report Authenticated By: Gaylyn Rong, M.D.    Dg Hip Complete Right  06/13/2012  *RADIOLOGY REPORT*  Clinical Data: Right hip pain.  RIGHT HIP - COMPLETE 2+ VIEW  Comparison: 06/04/2012.  Findings: No fracture or dislocation.  Mild subchondral sclerosis in the hips bilaterally.  Mild superior joint space narrowing in the left hip.  IMPRESSION:  1.  No acute findings. 2.  Mild degenerative changes in the left hip.   Original Report Authenticated By: Leanna Battles, M.D.    Dg Hip Complete Right  06/04/2012  *RADIOLOGY REPORT*  Clinical Data: Right hip pain  RIGHT HIP - COMPLETE 2+ VIEW   Comparison: 03/19/2008  Findings: No displaced fracture identified.  No dislocation.  No aggressive osseous lesions. Lower lumbar degenerative change. Overlying soft tissues unremarkable.  IMPRESSION: No displaced fracture identified. If clinical concern for fracture persists, MRI follow-up recommended (which has increased sensitivity for nondisplaced fracture).   Original Report Authenticated By: Jearld Lesch, M.D.    Dg Femur Right  06/19/2012  *RADIOLOGY REPORT*  Clinical Data: Status post multiple falls; right leg pain.  RIGHT FEMUR - 2 VIEW  Comparison: Right hip radiographs performed 06/13/2012  Findings: The right femur appears intact.  There is no evidence of fracture or dislocation.  The right femoral head remains seated at the acetabulum.  The right knee joint is grossly unremarkable in appearance.  No knee joint effusion is identified.  The visualized bowel gas pattern is grossly unremarkable.  No significant soft tissue abnormalities are seen on radiograph.  IMPRESSION: No evidence of fracture or dislocation.   Original Report Authenticated By: Tinnie Gens  Cherly Hensen, M.D.    Mr Lumbar Spine Wo Contrast  06/20/2012  *RADIOLOGY REPORT*  Clinical Data: Chronic back pain. History of prior surgery.  Right leg pain.  Status post fall  MRI LUMBAR SPINE WITHOUT CONTRAST  Technique:  Multiplanar and multiecho pulse sequences of the lumbar spine were obtained without intravenous contrast.  Comparison: CT abdomen and pelvis 06/20/2012 and 04/26/2011.  Findings: The patient has 0.5 cm of anterolisthesis of L4 on L5 due to advanced facet degenerative disease.  Remote T12 and L1 compression fractures are identified.  Vertebral body height loss of T12 is estimated 20% centrally.  More severe compression fracture L1 is also seen with some bony retropulsion as described below.  Vertebral body height is otherwise maintained.  No worrisome marrow lesion is identified.  The conus medullaris is normal in signal and  position. Imaged intra-abdominal contents are unremarkable.  The T10-11 level is imaged in the sagittal plane only and negative.  T11-12:  Negative.  T12-L1:  Mild bony retropulsion off the superior plate of L1 is seen but the central spinal canal and neural foramina are widely patent.  L1-2:  Minimal disc bulge without central canal or foraminal narrowing.  L2-3:  Negative.  L3-4:  There is ligamentum flavum thickening and a shallow disc bulge with moderate facet degenerative change.  Central canal is mildly narrowed.  Foramina are open.  L4-5:  Advanced facet degenerative change is present.  The disc is uncovered with a bulge.  There is a superimposed right lateral recess cephalad extrusion with a sequestered fragment extending just into the right foramen.  The disc fragment measures 0.8 cm cranial-caudal by 0.6 cm AP by 0.8 cm transverse and could impact the descending and exiting right L4 root.  The central canal is only mildly narrowed.  Mild to moderate left foraminal narrowing is identified.  L5-S1:  Small disc osteophyte complex.  The patient is status post posterior decompression.  Facet arthropathy noted.  Central canal is open.  Foramina are mildly narrowed.  IMPRESSION:  1.  Extruded and sequestered appearing disc fragment in the right lateral recess just cephalad to the L4-5 disc interspace could impact the descending and exiting right L4 root. 2.  Advanced facet arthropathy at L4-5 results in 0.5 cm of anterolisthesis.  Central canal is mildly narrowed this level. 3.  Remote T12 and L1 compression fractures. 4.  Status post posterior decompression L5-S1.  Central canal is open.  Foramina patent.   Original Report Authenticated By: Holley Dexter, M.D.    Dg Knee Complete 4 Views Right  06/13/2012  *RADIOLOGY REPORT*  Clinical Data: Right knee pain.  RIGHT KNEE - COMPLETE 4+ VIEW  Comparison: No joint effusion or fracture.  Chondrocalcinosis is seen in the lateral compartment.  Findings: No joint  effusion or fracture.  Chondrocalcinosis is seen in the lateral compartment.  IMPRESSION: 1.  No acute findings. 2.   Chondrocalcinosis in the lateral compartment of the knee.   Original Report Authenticated By: Leanna Battles, M.D.     MEDICATIONS: Scheduled Meds: . amLODipine  10 mg Oral Q breakfast  . calcium carbonate  1 tablet Oral Daily  . cholecalciferol  6,000 Units Oral Daily  . docusate sodium  100 mg Oral BID  . enoxaparin (LOVENOX) injection  40 mg Subcutaneous Q24H  . gabapentin  300 mg Oral BID  . hydrochlorothiazide  25 mg Oral Q breakfast  . irbesartan  75 mg Oral Daily  . mometasone-formoterol  2 puff Inhalation BID  .  naproxen  500 mg Oral Daily  . OxyCODONE  30 mg Oral Q12H  . pantoprazole  40 mg Oral Daily  . senna  1 tablet Oral BID  . simvastatin  20 mg Oral q1800   Continuous Infusions: . sodium chloride 50 mL/hr at 06/25/12 0524   PRN Meds:.acetaminophen, acetaminophen, albuterol, ALPRAZolam, alum & mag hydroxide-simeth, ipratropium, methocarbamol, morphine injection, oxyCODONE  Antibiotics: Anti-infectives   None       Jeoffrey Massed, MD  [facility] Pager:[phone]  If 7PM-7AM, please contact night-coverage [email or web address] Password [email or web address] 06/25/2012, 10:30 AM   LOS: 2 days

## 2012-06-25 NOTE — H&P (Signed)
[name] [name] is an 77 y.o. female.   Chief Complaint: pt has hx chronic back pain Spinal stenosis Surgery 10 yrs ago with Dr Rudi Coco 10 days ago:  MRI shows remote fxs T12 and L1; not acute Facet arthropathy L4-5 Back pain radiates to Rt leg: lateral leg to knee; also to Rt groin Request made for Lumbar Facet steroid injection for pain relief Pt has had "injection" in back before with Dr Ethelene Hal approx 6 yrs ago; helped HPI: spinal stenosis; chronic back pain; CAD; HTN; HLD; GERD  Past Medical History  Diagnosis Date  . CAD (coronary artery disease)     LHC 2003 with 40% pLAD, 30% mLAD, 100% D1 (moderate vessel), 100%  D2 (small vessel), 95% mid small OM2.  Pt had PTCA to D1;  D2 and OM2 small vessels and tx medically;  Last Myoview (5/09):  EF 66%, anteroseptal/apical infarct with minimal peri-infarct ischemia.  No significant change from 11/06. Pt managed medically  . Recurrent aspiration bronchitis/pneumonia     PFTs 12/09 FVC 97%, FEV1 101%, ratio 77%, TLC 109%. No significant obstruction or restriction.   . Hypertension   . Hyperlipidemia   . Esophageal stricture   . Diverticulosis of colon   . Gastroesophageal reflux disease with hiatal hernia   . Iron deficiency anemia   . Gastritis   . GERD (gastroesophageal reflux disease)   . Hiatal hernia   . Asthma   . Blood transfusion   . Cough   . Wheezing   . Weakness   . Trouble swallowing   . Abdominal pain   . Hiatal hernia   . Shortness of breath     Past Surgical History  Procedure Laterality Date  . Back surgery    . Cholecystectomy    . Bladder repair    . Hemorrhoid surgery    . Cholecystectomy      Patient unsure of date.  [name] Appendectomy      patient unsure of date  . Total abdominal hysterectomy  1975    partial  . Coronary angioplasty  11/16/2001  . Eye surgery  11/2000    bilateral cataracts with lens implant  . Hiatal hernia repair  06/03/2011    Procedure: LAPAROSCOPIC REPAIR OF HIATAL HERNIA;   Surgeon: Ernestene Mention, MD;  Location: [facility];  Service: General;  Laterality: N/A;  . Laparoscopic nissen fundoplication  06/03/2011    Procedure: LAPAROSCOPIC NISSEN FUNDOPLICATION;  Surgeon: Ernestene Mention, MD;  Location: [facility];  Service: General;  Laterality: N/A;    Family History  Problem Relation Age of Onset  . Cancer Mother     colon   . Colon cancer Mother 53  . Heart disease Father 68    heart attack   Social History:  reports that she has never smoked. She has never used smokeless tobacco. She reports that she does not drink alcohol or use illicit drugs.  Allergies:  Allergies  Allergen Reactions  . Aspartame Diarrhea and Nausea And Vomiting  . Daliresp (Roflumilast) Nausea And Vomiting    Medications Prior to Admission  Medication Sig Dispense Refill  . ALPRAZolam (XANAX) 0.5 MG tablet Take 0.5 mg by mouth at bedtime as needed. For sleep      . amLODipine (NORVASC) 10 MG tablet Take 10 mg by mouth daily with breakfast.       . calcium carbonate (TUMS - DOSED IN MG ELEMENTAL CALCIUM) 500 MG chewable tablet Chew 1 tablet by mouth daily as needed.  For heartburn      . Cholecalciferol (VITAMIN D) 1000 UNITS capsule Take 6,000 Units by mouth daily.       [name] CORAL CALCIUM PO Take 1 tablet by mouth daily.      . Cyanocobalamin (VITAMIN B 12 PO) Take 1 tablet by mouth daily.       [name] gabapentin (NEURONTIN) 300 MG capsule Take 300 mg by mouth at bedtime.      . hydrochlorothiazide 25 MG tablet Take 25 mg by mouth daily with breakfast.       . HYDROcodone-acetaminophen (NORCO/VICODIN) 5-325 MG per tablet Take 1-2 tablets by mouth every 6 (six) hours as needed.  30 tablet  0  . ipratropium-albuterol (DUONEB) 0.5-2.5 (3) MG/3ML SOLN Take 3 mLs by nebulization 4 (four) times daily as needed. For shortness of breath  360 mL    . mometasone-formoterol (DULERA) 200-5 MCG/ACT AERO Inhale 2 puffs into the lungs 2 (two) times daily.      . naproxen (NAPROSYN) 500 MG tablet Take 1  tablet (500 mg total) by mouth 2 (two) times daily with a meal.  30 tablet  0  . olmesartan (BENICAR) 20 MG tablet Take 1 tablet (20 mg total) by mouth daily.      [name] omeprazole (PRILOSEC) 20 MG capsule Take 20 mg by mouth daily.      . pravastatin (PRAVACHOL) 40 MG tablet Take 40 mg by mouth at bedtime.         Results for orders placed during the hospital encounter of 06/23/12 (from the past 48 hour(s))  BASIC METABOLIC PANEL     Status: Abnormal   Collection Time    06/23/12  5:17 PM      Result Value Range   Sodium 135  135 - 145 mEq/L   Potassium 3.7  3.5 - 5.1 mEq/L   Chloride 100  96 - 112 mEq/L   CO2 27  19 - 32 mEq/L   Glucose, Bld 98  70 - 99 mg/dL   BUN 23  6 - 23 mg/dL   Creatinine, Ser 7.82  0.50 - 1.10 mg/dL   Calcium 9.5  8.4 - 95.6 mg/dL   GFR calc non Af Amer 67 (*) >90 mL/min   GFR calc Af Amer 77 (*) >90 mL/min   Comment:            The eGFR has been calculated     using the CKD EPI equation.     This calculation has not been     validated in all clinical     situations.     eGFR's persistently     <90 mL/min signify     possible Chronic Kidney Disease.  CBC     Status: Abnormal   Collection Time    06/23/12  5:17 PM      Result Value Range   WBC 12.3 (*) 4.0 - 10.5 K/uL   RBC 4.84  3.87 - 5.11 MIL/uL   Hemoglobin 14.8  12.0 - 15.0 g/dL   HCT 21.3  08.6 - 57.8 %   MCV 89.7  78.0 - 100.0 fL   MCH 30.6  26.0 - 34.0 pg   MCHC 34.1  30.0 - 36.0 g/dL   RDW 46.9  62.9 - 52.8 %   Platelets 367  150 - 400 K/uL   No results found.  Review of Systems  Constitutional: Negative for fever and weight loss.  Musculoskeletal: Positive for back pain and joint pain.  Pain radiates to rt leg- to knee and groin  Neurological: Positive for weakness.    Blood pressure 100/63, pulse 80, temperature 98.7 F (37.1 C), temperature source Oral, resp. rate 18, height 5\' 5"  (1.651 m), weight 171 lb 11.8 oz (77.9 kg), SpO2 95.00%. Physical Exam  Constitutional: She is  oriented to person, place, and time.  Cardiovascular: Normal rate and regular rhythm.   No murmur heard. Respiratory: Effort normal and breath sounds normal. She has no wheezes.  GI: Soft.  Musculoskeletal: Normal range of motion.  Gait slow and guarded  Neurological: She is alert and oriented to person, place, and time.  Psychiatric: She has a normal mood and affect. Her behavior is normal. Judgment and thought content normal.     Assessment/Plan Pt with continued back pain after fall 10 days ago MRI shows non acute fxs at T12 and L1 Facet arthropathy L4-5 Request for facet injection in IR Dr Deanne Coffer to review films to determine if pt candidate Pt aware of this She is aware of procedure benefits and risks and agreeable to proceed Consent signed and in chart If Dr Deanne Coffer agreeable to move forward we will call for pt this afternoon Note in chart does mention Dr Otelia Sergeant to see pt also; we will continue to watch for note Marketia Stallsmith A 06/25/2012, 9:56 AM

## 2012-06-26 ENCOUNTER — Encounter (HOSPITAL_COMMUNITY)
Admission: EM | Disposition: A | Discharge status: Home Health | Payer: Self-pay | Source: Home / Self Care | Attending: Internal Medicine

## 2012-06-26 ENCOUNTER — Encounter (HOSPITAL_COMMUNITY): Payer: Self-pay | Admitting: Anesthesiology

## 2012-06-26 ENCOUNTER — Inpatient Hospital Stay (HOSPITAL_COMMUNITY): Payer: Medicare Other

## 2012-06-26 ENCOUNTER — Encounter (HOSPITAL_COMMUNITY): Payer: Self-pay | Admitting: Certified Registered Nurse Anesthetist

## 2012-06-26 ENCOUNTER — Inpatient Hospital Stay (HOSPITAL_COMMUNITY): Payer: Medicare Other | Admitting: Anesthesiology

## 2012-06-26 HISTORY — PX: LUMBAR LAMINECTOMY/DECOMPRESSION MICRODISCECTOMY: SHX5026

## 2012-06-26 LAB — COMPREHENSIVE METABOLIC PANEL
ALT: 19 U/L (ref 0–35)
Albumin: 3.2 g/dL — ABNORMAL LOW (ref 3.5–5.2)
BUN: 19 mg/dL (ref 6–23)
Calcium: 8.9 mg/dL (ref 8.4–10.5)
GFR calc Af Amer: 70 mL/min — ABNORMAL LOW (ref >90)
Glucose, Bld: 105 mg/dL — ABNORMAL HIGH (ref 70–99)
Sodium: 138 mEq/L (ref 135–145)
Total Protein: 5.8 g/dL — ABNORMAL LOW (ref 6.0–8.3)

## 2012-06-26 LAB — TYPE AND SCREEN
ABO/RH(D): O POS
Antibody Screen: NEGATIVE

## 2012-06-26 LAB — APTT: aPTT: 27 seconds (ref 24–37)

## 2012-06-26 LAB — CBC
MCHC: 32.8 g/dL (ref 30.0–36.0)
Platelets: 321 10*3/uL (ref 150–400)
RDW: 14.4 % (ref 11.5–15.5)

## 2012-06-26 LAB — PROTIME-INR: Prothrombin Time: 11.6 seconds (ref 11.6–15.2)

## 2012-06-26 SURGERY — LUMBAR LAMINECTOMY/DECOMPRESSION MICRODISCECTOMY
Anesthesia: General | Site: Spine Lumbar | Laterality: Right | Wound class: Clean

## 2012-06-26 MED ORDER — POLYETHYLENE GLYCOL 3350 17 G PO PACK
17.0000 g | PACK | Freq: Every day | ORAL | Status: DC | PRN
  Administered 2012-06-28: 17 g via ORAL
  Filled 2012-06-26: qty 1

## 2012-06-26 MED ORDER — CEFAZOLIN SODIUM 1-5 GM-% IV SOLN
1.0000 g | Freq: Three times a day (TID) | INTRAVENOUS | Status: AC
  Administered 2012-06-26 – 2012-06-27 (×2): 1 g via INTRAVENOUS
  Filled 2012-06-26 (×2): qty 50

## 2012-06-26 MED ORDER — ONDANSETRON HCL 4 MG/2ML IJ SOLN
4.0000 mg | INTRAMUSCULAR | Status: DC | PRN
  Administered 2012-06-27: 4 mg via INTRAVENOUS
  Filled 2012-06-26: qty 2

## 2012-06-26 MED ORDER — PROPOFOL 10 MG/ML IV BOLUS
INTRAVENOUS | Status: DC | PRN
  Administered 2012-06-26: 150 mg via INTRAVENOUS

## 2012-06-26 MED ORDER — ACETAMINOPHEN 325 MG PO TABS: 650.0000 mg | ORAL_TABLET | ORAL | Status: DC | PRN

## 2012-06-26 MED ORDER — ONDANSETRON HCL 4 MG/2ML IJ SOLN
INTRAMUSCULAR | Status: DC | PRN
  Administered 2012-06-26: 4 mg via INTRAVENOUS

## 2012-06-26 MED ORDER — LIDOCAINE HCL (CARDIAC) 20 MG/ML IV SOLN
INTRAVENOUS | Status: DC | PRN
  Administered 2012-06-26: 50 mg via INTRAVENOUS

## 2012-06-26 MED ORDER — MORPHINE SULFATE 2 MG/ML IJ SOLN
INTRAMUSCULAR | Status: AC
  Administered 2012-06-26: 2 mg via INTRAVENOUS
  Filled 2012-06-26: qty 1

## 2012-06-26 MED ORDER — ARTIFICIAL TEARS OP OINT
TOPICAL_OINTMENT | OPHTHALMIC | Status: DC | PRN
  Administered 2012-06-26: 1 via OPHTHALMIC

## 2012-06-26 MED ORDER — PHENOL 1.4 % MT LIQD: 1.0000 | OROMUCOSAL | Status: DC | PRN

## 2012-06-26 MED ORDER — 0.9 % SODIUM CHLORIDE (POUR BTL) OPTIME
TOPICAL | Status: DC | PRN
  Administered 2012-06-26: 1000 mL

## 2012-06-26 MED ORDER — BUPIVACAINE-EPINEPHRINE 0.5% -1:200000 IJ SOLN
INTRAMUSCULAR | Status: DC | PRN
  Administered 2012-06-26: 8 mL

## 2012-06-26 MED ORDER — FENTANYL CITRATE 0.05 MG/ML IJ SOLN
INTRAMUSCULAR | Status: DC | PRN
  Administered 2012-06-26: 100 ug via INTRAVENOUS
  Administered 2012-06-26: 150 ug via INTRAVENOUS
  Administered 2012-06-26: 50 ug via INTRAVENOUS

## 2012-06-26 MED ORDER — ROCURONIUM BROMIDE 100 MG/10ML IV SOLN
INTRAVENOUS | Status: DC | PRN
  Administered 2012-06-26: 50 mg via INTRAVENOUS

## 2012-06-26 MED ORDER — MIDAZOLAM HCL 5 MG/5ML IJ SOLN
INTRAMUSCULAR | Status: DC | PRN
  Administered 2012-06-26: 1 mg via INTRAVENOUS

## 2012-06-26 MED ORDER — THROMBIN 20000 UNITS EX SOLR
CUTANEOUS | Status: AC
  Filled 2012-06-26: qty 20000

## 2012-06-26 MED ORDER — NEOSTIGMINE METHYLSULFATE 1 MG/ML IJ SOLN
INTRAMUSCULAR | Status: DC | PRN
  Administered 2012-06-26: 3 mg via INTRAVENOUS

## 2012-06-26 MED ORDER — DEXTROSE 5 % IV SOLN
3.0000 g | INTRAVENOUS | Status: DC | PRN
  Administered 2012-06-26: 3 g via INTRAVENOUS
  Administered 2012-06-26: 16:00:00 via INTRAVENOUS

## 2012-06-26 MED ORDER — LACTATED RINGERS IV SOLN
INTRAVENOUS | Status: DC
  Administered 2012-06-26: 14:00:00 via INTRAVENOUS

## 2012-06-26 MED ORDER — ACETAMINOPHEN 650 MG RE SUPP: 650.0000 mg | RECTAL | Status: DC | PRN

## 2012-06-26 MED ORDER — SODIUM CHLORIDE 0.9 % IJ SOLN
3.0000 mL | Freq: Two times a day (BID) | INTRAMUSCULAR | Status: DC
  Administered 2012-06-27 (×2): 3 mL via INTRAVENOUS

## 2012-06-26 MED ORDER — LACTATED RINGERS IV SOLN
INTRAVENOUS | Status: DC | PRN
  Administered 2012-06-26 (×3): via INTRAVENOUS

## 2012-06-26 MED ORDER — HEMOSTATIC AGENTS (NO CHARGE) OPTIME
TOPICAL | Status: DC | PRN
  Administered 2012-06-26: 1 via TOPICAL

## 2012-06-26 MED ORDER — THROMBIN 5000 UNITS EX SOLR
CUTANEOUS | Status: DC | PRN
  Administered 2012-06-26: 5000 [IU] via TOPICAL

## 2012-06-26 MED ORDER — BUPIVACAINE-EPINEPHRINE (PF) 0.5% -1:200000 IJ SOLN
INTRAMUSCULAR | Status: AC
  Filled 2012-06-26: qty 10

## 2012-06-26 MED ORDER — SODIUM CHLORIDE 0.9 % IV SOLN: 250.0000 mL | INTRAVENOUS | Status: DC

## 2012-06-26 MED ORDER — HYDROCODONE-ACETAMINOPHEN 5-325 MG PO TABS
1.0000 | ORAL_TABLET | ORAL | Status: DC | PRN
  Administered 2012-06-28: 1 via ORAL
  Filled 2012-06-26: qty 1

## 2012-06-26 MED ORDER — GLYCOPYRROLATE 0.2 MG/ML IJ SOLN
INTRAMUSCULAR | Status: DC | PRN
  Administered 2012-06-26: 0.6 mg via INTRAVENOUS

## 2012-06-26 MED ORDER — THROMBIN 5000 UNITS EX SOLR
CUTANEOUS | Status: AC
  Filled 2012-06-26: qty 5000

## 2012-06-26 MED ORDER — MENTHOL 3 MG MT LOZG: 1.0000 | LOZENGE | OROMUCOSAL | Status: DC | PRN

## 2012-06-26 MED ORDER — FLEET ENEMA 7-19 GM/118ML RE ENEM: 1.0000 | ENEMA | Freq: Once | RECTAL | Status: AC | PRN

## 2012-06-26 MED ORDER — SODIUM CHLORIDE 0.9 % IJ SOLN: 3.0000 mL | INTRAMUSCULAR | Status: DC | PRN

## 2012-06-26 MED ORDER — SORBITOL 70 % SOLN: 30.0000 mL | Freq: Every day | Status: DC | PRN

## 2012-06-26 SURGICAL SUPPLY — 65 items
ADH SKN CLS APL DERMABOND .7 (GAUZE/BANDAGES/DRESSINGS)
AIRSTRIP 3X4 (GAUZE/BANDAGES/DRESSINGS) ×1 IMPLANT
BUR ROUND FLUTED 4 SOFT TCH (BURR) ×1 IMPLANT
CANISTER SUCTION 2500CC (MISCELLANEOUS) ×1 IMPLANT
CLOTH BEACON ORANGE TIMEOUT ST (SAFETY) ×2 IMPLANT
CORDS BIPOLAR (ELECTRODE) ×2 IMPLANT
DERMABOND ADVANCED (GAUZE/BANDAGES/DRESSINGS)
DERMABOND ADVANCED .7 DNX12 (GAUZE/BANDAGES/DRESSINGS) ×1 IMPLANT
DRAPE INCISE IOBAN 66X45 STRL (DRAPES) ×3 IMPLANT
DRAPE MICROSCOPE LEICA (MISCELLANEOUS) ×2 IMPLANT
DRAPE POUCH INSTRU U-SHP 10X18 (DRAPES) ×2 IMPLANT
DRAPE PROXIMA HALF (DRAPES) IMPLANT
DRAPE SURG 17X23 STRL (DRAPES) ×7 IMPLANT
DRSG MEPILEX BORDER 4X4 (GAUZE/BANDAGES/DRESSINGS) ×1 IMPLANT
DRSG MEPILEX BORDER 4X8 (GAUZE/BANDAGES/DRESSINGS) IMPLANT
DURAPREP 26ML APPLICATOR (WOUND CARE) ×2 IMPLANT
ELECT BLADE 4.0 EZ CLEAN MEGAD (MISCELLANEOUS) ×2
ELECT CAUTERY BLADE 6.4 (BLADE) ×2 IMPLANT
ELECT REM PT RETURN 9FT ADLT (ELECTROSURGICAL) ×2
ELECTRODE BLDE 4.0 EZ CLN MEGD (MISCELLANEOUS) IMPLANT
ELECTRODE REM PT RTRN 9FT ADLT (ELECTROSURGICAL) ×1 IMPLANT
EVACUATOR 1/8 PVC DRAIN (DRAIN) IMPLANT
GLOVE BIOGEL PI IND STRL 7.5 (GLOVE) ×1 IMPLANT
GLOVE BIOGEL PI IND STRL 8 (GLOVE) IMPLANT
GLOVE BIOGEL PI INDICATOR 7.5 (GLOVE) ×2
GLOVE BIOGEL PI INDICATOR 8 (GLOVE) ×1
GLOVE ECLIPSE 7.0 STRL STRAW (GLOVE) ×2 IMPLANT
GLOVE ECLIPSE 8.5 STRL (GLOVE) ×3 IMPLANT
GLOVE SURG 8.5 LATEX PF (GLOVE) ×2 IMPLANT
GLOVE SURG SS PI 8.0 STRL IVOR (GLOVE) ×2 IMPLANT
GOWN PREVENTION PLUS LG XLONG (DISPOSABLE) ×1 IMPLANT
GOWN PREVENTION PLUS XXLARGE (GOWN DISPOSABLE) ×2 IMPLANT
GOWN SRG XL XLNG 56XLVL 4 (GOWN DISPOSABLE) IMPLANT
GOWN STRL NON-REIN LRG LVL3 (GOWN DISPOSABLE) ×2 IMPLANT
GOWN STRL NON-REIN XL XLG LVL4 (GOWN DISPOSABLE) ×2
KIT BASIN OR (CUSTOM PROCEDURE TRAY) ×2 IMPLANT
KIT ROOM TURNOVER OR (KITS) ×2 IMPLANT
MANIFOLD NEPTUNE II (INSTRUMENTS) ×1 IMPLANT
NDL SPNL 18GX3.5 QUINCKE PK (NEEDLE) ×2 IMPLANT
NEEDLE 22X1 1/2 (OR ONLY) (NEEDLE) ×2 IMPLANT
NEEDLE SPNL 18GX3.5 QUINCKE PK (NEEDLE) ×2 IMPLANT
NS IRRIG 1000ML POUR BTL (IV SOLUTION) ×2 IMPLANT
PACK LAMINECTOMY ORTHO (CUSTOM PROCEDURE TRAY) ×2 IMPLANT
PAD ARMBOARD 7.5X6 YLW CONV (MISCELLANEOUS) ×4 IMPLANT
PATTIES SURGICAL .5 X.5 (GAUZE/BANDAGES/DRESSINGS) IMPLANT
PATTIES SURGICAL .75X.75 (GAUZE/BANDAGES/DRESSINGS) IMPLANT
PATTIES SURGICAL 1X1 (DISPOSABLE) IMPLANT
SPECIMEN JAR SMALL (MISCELLANEOUS) ×1 IMPLANT
SPONGE LAP 4X18 X RAY DECT (DISPOSABLE) IMPLANT
SPONGE SURGIFOAM ABS GEL 100 (HEMOSTASIS) IMPLANT
SUT VIC AB 0 CT1 27 (SUTURE) ×4
SUT VIC AB 0 CT1 27XBRD ANBCTR (SUTURE) ×2 IMPLANT
SUT VIC AB 1 CT1 27 (SUTURE) ×2
SUT VIC AB 1 CT1 27XBRD ANBCTR (SUTURE) ×2 IMPLANT
SUT VIC AB 2-0 CT1 27 (SUTURE) ×2
SUT VIC AB 2-0 CT1 TAPERPNT 27 (SUTURE) IMPLANT
SUT VICRYL 0 UR6 27IN ABS (SUTURE) ×1 IMPLANT
SUT VICRYL 4-0 PS2 18IN ABS (SUTURE) ×1 IMPLANT
SYR CONTROL 10ML LL (SYRINGE) ×2 IMPLANT
TOWEL OR 17X24 6PK STRL BLUE (TOWEL DISPOSABLE) ×2 IMPLANT
TOWEL OR 17X26 10 PK STRL BLUE (TOWEL DISPOSABLE) ×2 IMPLANT
TRAY FOLEY CATH 14FR (SET/KITS/TRAYS/PACK) IMPLANT
TRAY FOLEY CATH 14FRSI W/METER (CATHETERS) ×1 IMPLANT
WATER STERILE IRR 1000ML POUR (IV SOLUTION) ×1 IMPLANT
YANKAUER SUCT BULB TIP NO VENT (SUCTIONS) ×2 IMPLANT

## 2012-06-26 NOTE — Transfer of Care (Signed)
Immediate Anesthesia Transfer of Care Note  Patient: [name] [name]  Procedure(s) Performed: Procedure(s) with comments: LUMBAR LAMINECTOMY/DECOMPRESSION MICRODISCECTOMY (Right) - RIGHT L4-5 MICRODISCECTOMY  Patient Location: PACU  Anesthesia Type:General  Level of Consciousness: awake and patient cooperative  Airway & Oxygen Therapy: Patient Spontanous Breathing and Patient connected to nasal cannula oxygen  Post-op Assessment: Report given to PACU RN, Post -op Vital signs reviewed and stable and Patient moving all extremities X 4  Post vital signs: Reviewed and stable  Complications: No apparent anesthesia complications

## 2012-06-26 NOTE — Op Note (Signed)
06/23/2012 - 06/26/2012  7:01 PM  PATIENT:  [name] [name]  77 y.o. female  MRN: [id number]  OPERATIVE REPORT  PRE-OPERATIVE DIAGNOSIS:  LEFT L4-5 HERNIATED DISC AND SCIATICA RIGHT SIDE AND TRACTABLE  POST-OPERATIVE DIAGNOSIS:  LEFT L4-5 HERNIATED DISC AND SCIATICA RIGHT SIDE AND TRACTABLE  PROCEDURE:  Procedure(s): LUMBAR LAMINECTOMY/DECOMPRESSION MICRODISCECTOMY    SURGEON:  Kerrin Champagne, MD     ASSISTANT:  Maud Deed, PA-C  (Present throughout the entire procedure and necessary for completion of procedure in a timely manner)     ANESTHESIA:  General,supplemented with local Dr. Lurlean Leyden.    COMPLICATIONS:  None.   FINDINGS: 2 free fragments each 41mmx9mmx7mm each    PROCEDURE:The patient was met in the holding area, and the appropriate Left Lumbar level L4-5 identified and marked with "x" and my initials.The patient was then transported to OR and was placed under general anesthesia without difficulty. The patient received appropriate preoperative antibiotic prophylaxis. The patient after intubation atraumatically was transferred to the operating room table, prone position, Wilson frame, sliding OR table. All pressure points were well padded. The arms in 90-90 well-padded at the elbows. Standard prep with DuraPrep solution lower dorsal spine to the mid sacral segment. Draped in the usual manner iodine Vi-Drape was used. Time-out procedure was called and correct. 2x 18-gauge spinal needle was then inserted at the expected L4-5 level.  C-arm was draped sterilely to the field and used to identify the spinal needles positions. The needle was at the lower aspect of the lamina of L4.  Skin inferior to this was then infiltrated with Marcaine half percent with 1-200,000 epinephrine total of 17 cc used. An incision approximately an inch inch and a half in length was then made through skin and subcutaneous layers in line with the left side of the expected midline just superior to the  spinal needle entry point. An incision made into the left lumbosacral fascia approximately an inch in length .   Smallest dilator was then introduced into the incision site and used to carefully form subperiosteal movement of the hip paralumbar muscles off of the posterior lamina of the expected L4-5 level. Successive dilators were then carried up to the 11 mm size. The depth measured off of the dilators at about 60 mm and 60 mm retractors and placed on the scaffolding for the MIS equipment and guided over dilators down to and docking on the posterior aspect of the lamina at the expected L4-5 level. This was sterilely attached to the articulating arm and it's up right which had been attached the OR table sterilely. C-arm fluoroscopy was identified the dilators and the retractors at the appropriate level L4-5. The operating room microscope sterilely draped brought into the field. Under the operating room microscope, the L4-5 interspace carefully debrided the small amount of muscle attachment here and high-speed bur used to drill the medial aspect of the inferior articular process of L4 approximately 10%. A localization lateral C-arm view was obtained with Penfield 4 in the L4-5 facet. 2 mm Kerrison then used to enter the spinal canal over the superior aspect of the L5 lamina carefully using the Kerrison to debris the attachment as a curet. Foraminotomy was then performed over the L5 nerve root. The medial 10% superior articular process of L5 and then resected using 2 mm Kerrison. This allowed for identification of the thecal sac. Penfield 4 was then used to carefully mobilize the thecal sac medially and the L5 nerve root identified within the lateral recess  flattened over the posterior aspect of the herniated disc. Carefully the lateral aspect of the L5 nerve root was identified and a Penfield 4 was used to mobilize the nerve medially such that the superior margin of the L4-5 disc was visible with microscope. Using  a Penfield 4 for retraction and a Penfield and ball tipped nerve hook was used to remove herniated disc within the superior lateral recess on the right side deep to the L4 nerve root within the entry point of the right L4 neuroforamen. This extruded disc material was removed using micropituitary rongeurs and nerve hook nerve root and then more easily able to be mobilized medially and retracted using a love retractor. Further foraminotomies was performed over the L4 nerve root the nerve root was noted to be X. without further compression. The nerve root able to be retracted along the medial aspect of the L5 pedicle and disc material found to be subligamentous at this level was further resected current pituitary rongeurs. Ligamentum flavum was further debrided superiorly to the level L4-5 disc. Had a moderate amount of further resection of the L5 lamina inferiorly was performed. With this then the disc space at L4-5 was easily visualized and entry into the disc at the sided disc herniation was possible using a Penfield 4 intraoperative Lateral radiograph was used to identify the L5-S1 disc with the Penfield 4 In place just below the disc space.  Micropituitary was used to further debride this material superficially from the posterior aspect of the intervertebral disc is posterior lateral aspect of the disc. Small amount of further disc material was found subligamentous extending inferiorly from the disc this was removed using micropituitary rongeurs. Ligamentum flavum was debrided and lateral recess along the medial aspect L5-S1 facet no further decompression was necessary. Ball tip nerve probe was then able to carefully palpate the neuroforamen for L4 and L5 finding these to be well decompressed. Bleeding was then controlled using thrombin-soaked Gelfoam small cottonoids.  Small amount of bleeding within the soft tissue mass the laminotomy area was controlled using bipolar electrocautery. Irrigation was carried out  using copious amounts of irrigant solution. All Gelfoam  were then removed. No significant active bleeding present at the time of removal. All instruments sponge counts were correct traction system was then carefully removed carefully rotating retractors with this withdrawal and only bipolar electrocautery of any small bleeders. Lumbodorsal fascia was then carefully approximated with interrupted 0 Vicryl sutures, UR 6 needle deep subcutaneous layers were approximated with interrupted 0 Vicryl sutures on UR 6 the appear subcutaneous layers approximated with interrupted 2-0 Vicryl sutures and the skin closed with a running subcutaneous stitch of 4-0 Vicryl. Dermabond was applied allowed to dry and then Mepilex bandage applied. Patient was then carefully returned to supine position on a stretcher, reactivated and extubated. He was then returned to recovery room in satisfactory condition.  Maud Deed PA-C perform the duties of assistant surgeon during this case. She was present from the beginning of the case to the end of the case assisting in transfer the patient from his stretcher to the OR table and back to the stretcher at the end of the case. Assisted in careful retraction and suction of the laminectomy site delicate neural structures operating under the operating room microscope. She performed closure of the incision from the fascia to the skin applying the dressing.     Albertha Beattie E 06/26/2012, 7:01 PM

## 2012-06-26 NOTE — Brief Op Note (Signed)
06/23/2012 - 06/26/2012  6:58 PM  PATIENT:  [name] [name]  77 y.o. female  PRE-OPERATIVE DIAGNOSIS:  LEFT L4-5 HERNIATED DISC AND SCIATICA RIGHT SIDE AND TRACTABLE  POST-OPERATIVE DIAGNOSIS:  LEFT L4-5 HERNIATED DISC AND SCIATICA RIGHT SIDE AND TRACTABLE  PROCEDURE:  Procedure(s) with comments: LUMBAR LAMINECTOMY/DECOMPRESSION MICRODISCECTOMY (Right) - RIGHT L4-5 MICRODISCECTOMY  SURGEON:  Surgeon(s) and Role:    * Kerrin Champagne, MD - Primary  PHYSICIAN ASSISTANT: Ermalene Postin  ANESTHESIA:   local, general and marcaine 1/4% with epi.  Dr. Michelle Piper Dr. Jacklynn Bue  EBL:  Total I/O In: 1000 [I.V.:1000] Out: 750 [Urine:500; Blood:250]  BLOOD ADMINISTERED:none  DRAINS: Urinary Catheter (Foley)   LOCAL MEDICATIONS USED:  MARCAINE    and Amount: 17  ml  SPECIMEN:  No Specimen  DISPOSITION OF SPECIMEN:  N/A  COUNTS:  YES  TOURNIQUET:  * No tourniquets in log *  DICTATION: .Dragon Dictation  PLAN OF CARE: Admit to inpatient   PATIENT DISPOSITION:  PACU - hemodynamically stable.   Delay start of Pharmacological VTE agent (>24hrs) due to surgical blood loss or risk of bleeding: yes

## 2012-06-26 NOTE — Anesthesia Postprocedure Evaluation (Signed)
Anesthesia Post Note  Patient: [name] [name]  Procedure(s) Performed: Procedure(s) (LRB): LUMBAR LAMINECTOMY/DECOMPRESSION MICRODISCECTOMY (Right)  Anesthesia type: general  Patient location: PACU  Post pain: Pain level controlled  Post assessment: Patient's Cardiovascular Status Stable  Last Vitals:  Filed Vitals:   06/26/12 2015  BP:   Pulse: 94  Temp:   Resp: 17    Post vital signs: Reviewed and stable  Level of consciousness: sedated  Complications: No apparent anesthesia complications

## 2012-06-26 NOTE — Preoperative (Signed)
Beta Blockers   Reason not to administer Beta Blockers:Not Applicable 

## 2012-06-26 NOTE — Progress Notes (Signed)
PATIENT DETAILS Name: [name] [name] Age: 77 y.o. Sex: female Date of Birth: [birth year] Admit Date: 06/23/2012 Admitting Physician Sorin Luanne Bras, MD WGN:[id number],MVHQION DAVID, MD  Subjective: Pain much more better today-seems very comfortable  Assessment/Plan: Active Problems: Acute on chronic back pain  - MRI of lumbar spine without contrast done on 2/12- showed Extruded and sequestered appearing disc fragment in the right lateral recess-which could impact the L4 nerve root -Dr. Otelia Sergeant input appreciated, for OR today -Appreciate IR input-but steroid injection has been cancelled-as patient going to the OR today - c/w OxyContin 30 mg twice a day, use OxyIR and IV morphine for moderate and severe breakthrough pain.  Hypertension - Controlled with amlodipine, hydrochlorothiazide and Avapro    HYPERLIPIDEMIA - Continue with statins  History of coronary artery disease - Only with no chest pain or shortness of breath - stable  GERD - Continue PPI  History of bronchial asthma - Lungs currently clear - Continue with [facility] - As needed nebulized bronchodilators   Disposition: Remain inpatient  DVT Prophylaxis: Resume Prophylactic Lovenox post op-at the discretion of Ortho  Code Status: Full code    Procedures:  None  CONSULTS:  orthopedic surgery  PHYSICAL EXAM: Vital signs in last 24 hours: Filed Vitals:   06/25/12 1442 06/25/12 2155 06/25/12 2206 06/26/12 0536  BP: 116/74  119/60 136/77  Pulse: 102  89 86  Temp: 98.4 F (36.9 C)  98.3 F (36.8 C) 98.6 F (37 C)  TempSrc: Oral  Oral Oral  Resp:   20 18  Height:      Weight:      SpO2:  94% 93% 96%    Weight change:  Body mass index is 28.58 kg/(m^2).   Gen Exam: Awake and alert with clear speech.   Neck: Supple, No JVD.   Chest: B/L Clear.   CVS: S1 S2 Regular, no murmurs.  Abdomen: soft, BS +, non tender, non distended.  Extremities: no edema, lower extremities warm to touch. Neurologic: Non  Focal.   Skin: No Rash.   Wounds: N/A.   Intake/Output from previous day:  Intake/Output Summary (Last 24 hours) at 06/26/12 0909 Last data filed at 06/26/12 0600  Gross per 24 hour  Intake   1440 ml  Output   1700 ml  Net   -260 ml     LAB RESULTS: CBC  Recent Labs Lab 06/20/12 1100 06/23/12 1717 06/25/12 0935 06/26/12 0525  WBC 10.3 12.3* 13.0* 11.0*  HGB 14.6 14.8 13.6 13.0  HCT 43.4 43.4 40.9 39.6  PLT 411* 367 343 321  MCV 89.5 89.7 90.9 91.7  MCH 30.1 30.6 30.2 30.1  MCHC 33.6 34.1 33.3 32.8  RDW 14.1 14.5 14.6 14.4    Chemistries   Recent Labs Lab 06/20/12 1100 06/23/12 1717 06/26/12 0525  NA 137 135 138  K 4.2 3.7 3.6  CL 99 100 100  CO2 25 27 29   GLUCOSE 141* 98 105*  BUN 24* 23 19  CREATININE 0.87 0.84 0.91  CALCIUM 9.4 9.5 8.9    CBG: No results found for this basename: GLUCAP,  in the last 168 hours  GFR Estimated Creatinine Clearance: 56 ml/min (by C-G formula based on Cr of 0.91).  Coagulation profile  Recent Labs Lab 06/25/12 0935 06/26/12 0525  INR 0.87 0.85    Cardiac Enzymes No results found for this basename: CK, CKMB, TROPONINI, MYOGLOBIN,  in the last 168 hours  No components found with this basename: POCBNP,  No results  found for this basename: DDIMER,  in the last 72 hours No results found for this basename: HGBA1C,  in the last 72 hours No results found for this basename: CHOL, HDL, LDLCALC, TRIG, CHOLHDL, LDLDIRECT,  in the last 72 hours No results found for this basename: TSH, T4TOTAL, FREET3, T3FREE, THYROIDAB,  in the last 72 hours No results found for this basename: VITAMINB12, FOLATE, FERRITIN, TIBC, IRON, RETICCTPCT,  in the last 72 hours No results found for this basename: LIPASE, AMYLASE,  in the last 72 hours  Urine Studies No results found for this basename: UACOL, UAPR, USPG, UPH, UTP, UGL, UKET, UBIL, UHGB, UNIT, UROB, ULEU, UEPI, UWBC, URBC, UBAC, CAST, CRYS, UCOM, BILUA,  in the last 72  hours  MICROBIOLOGY: Recent Results (from the past 240 hour(s))  SURGICAL PCR SCREEN     Status: None   Collection Time    06/25/12  8:19 PM      Result Value Range Status   MRSA, PCR NEGATIVE  NEGATIVE Final   Staphylococcus aureus NEGATIVE  NEGATIVE Final   Comment:            The Xpert SA Assay (FDA     approved for NASAL specimens     in patients over 64 years of age),     is one component of     a comprehensive surveillance     program.  Test performance has     been validated by [employer] for patients greater     than or equal to 70 year old.     It is not intended     to diagnose infection nor to     guide or monitor treatment.    RADIOLOGY STUDIES/RESULTS: Ct Abdomen Pelvis Wo Contrast  06/20/2012  *RADIOLOGY REPORT*  Clinical Data: Back pain.  Fall.  CT ABDOMEN AND PELVIS WITHOUT CONTRAST  Technique:  Multidetector CT imaging of the abdomen and pelvis was performed following the standard protocol without intravenous contrast.  Comparison: 04/26/2011  Findings: Hiatal hernia wrap noted above the diaphragm.  The visualized portion of the liver, spleen, pancreas, and adrenal glands appear unremarkable in noncontrast CT appearance.  The kidneys appear unremarkable, as do the proximal ureters.  Gallbladder surgically absent.  Common bile duct approximately 1.1 cm in diameter.  No pathologic retroperitoneal or porta hepatis adenopathy is identified.  Aortoiliac atherosclerotic calcification is present.  Borderline prominence stool noted in the colon, particularly the cecum.  Appendix absent.  Uterus absent.  The ovaries unremarkable.  Urinary bladder appears unremarkable.  No significant change in the L1 compression fracture.  Grade 1 anterolisthesis at L4-5 is again noted with degenerative facet arthropathy, but without pars defects.  Posterior decompression is again noted at the L5-S1 level.  Suspected disc bulge at L4-5, similar to prior.  IMPRESSION:  1.  Unchanged appearance  of L1 compression fracture and lumbar spondylosis and degenerative disc disease. 2.  Borderline prominence of stool in the colon. 3.  Atherosclerosis. 4.  Hiatal hernia wrap noted above the diaphragm.   Original Report Authenticated By: Gaylyn Rong, M.D.    Dg Hip Complete Right  06/13/2012  *RADIOLOGY REPORT*  Clinical Data: Right hip pain.  RIGHT HIP - COMPLETE 2+ VIEW  Comparison: 06/04/2012.  Findings: No fracture or dislocation.  Mild subchondral sclerosis in the hips bilaterally.  Mild superior joint space narrowing in the left hip.  IMPRESSION:  1.  No acute findings. 2.  Mild degenerative changes in  the left hip.   Original Report Authenticated By: Leanna Battles, M.D.    Dg Hip Complete Right  06/04/2012  *RADIOLOGY REPORT*  Clinical Data: Right hip pain  RIGHT HIP - COMPLETE 2+ VIEW  Comparison: 03/19/2008  Findings: No displaced fracture identified.  No dislocation.  No aggressive osseous lesions. Lower lumbar degenerative change. Overlying soft tissues unremarkable.  IMPRESSION: No displaced fracture identified. If clinical concern for fracture persists, MRI follow-up recommended (which has increased sensitivity for nondisplaced fracture).   Original Report Authenticated By: Jearld Lesch, M.D.    Dg Femur Right  06/19/2012  *RADIOLOGY REPORT*  Clinical Data: Status post multiple falls; right leg pain.  RIGHT FEMUR - 2 VIEW  Comparison: Right hip radiographs performed 06/13/2012  Findings: The right femur appears intact.  There is no evidence of fracture or dislocation.  The right femoral head remains seated at the acetabulum.  The right knee joint is grossly unremarkable in appearance.  No knee joint effusion is identified.  The visualized bowel gas pattern is grossly unremarkable.  No significant soft tissue abnormalities are seen on radiograph.  IMPRESSION: No evidence of fracture or dislocation.   Original Report Authenticated By: Tonia Ghent, M.D.    Mr Lumbar Spine Wo  Contrast  06/20/2012  *RADIOLOGY REPORT*  Clinical Data: Chronic back pain. History of prior surgery.  Right leg pain.  Status post fall  MRI LUMBAR SPINE WITHOUT CONTRAST  Technique:  Multiplanar and multiecho pulse sequences of the lumbar spine were obtained without intravenous contrast.  Comparison: CT abdomen and pelvis 06/20/2012 and 04/26/2011.  Findings: The patient has 0.5 cm of anterolisthesis of L4 on L5 due to advanced facet degenerative disease.  Remote T12 and L1 compression fractures are identified.  Vertebral body height loss of T12 is estimated 20% centrally.  More severe compression fracture L1 is also seen with some bony retropulsion as described below.  Vertebral body height is otherwise maintained.  No worrisome marrow lesion is identified.  The conus medullaris is normal in signal and position. Imaged intra-abdominal contents are unremarkable.  The T10-11 level is imaged in the sagittal plane only and negative.  T11-12:  Negative.  T12-L1:  Mild bony retropulsion off the superior plate of L1 is seen but the central spinal canal and neural foramina are widely patent.  L1-2:  Minimal disc bulge without central canal or foraminal narrowing.  L2-3:  Negative.  L3-4:  There is ligamentum flavum thickening and a shallow disc bulge with moderate facet degenerative change.  Central canal is mildly narrowed.  Foramina are open.  L4-5:  Advanced facet degenerative change is present.  The disc is uncovered with a bulge.  There is a superimposed right lateral recess cephalad extrusion with a sequestered fragment extending just into the right foramen.  The disc fragment measures 0.8 cm cranial-caudal by 0.6 cm AP by 0.8 cm transverse and could impact the descending and exiting right L4 root.  The central canal is only mildly narrowed.  Mild to moderate left foraminal narrowing is identified.  L5-S1:  Small disc osteophyte complex.  The patient is status post posterior decompression.  Facet arthropathy  noted.  Central canal is open.  Foramina are mildly narrowed.  IMPRESSION:  1.  Extruded and sequestered appearing disc fragment in the right lateral recess just cephalad to the L4-5 disc interspace could impact the descending and exiting right L4 root. 2.  Advanced facet arthropathy at L4-5 results in 0.5 cm of anterolisthesis.  Central canal is  mildly narrowed this level. 3.  Remote T12 and L1 compression fractures. 4.  Status post posterior decompression L5-S1.  Central canal is open.  Foramina patent.   Original Report Authenticated By: Holley Dexter, M.D.    Dg Knee Complete 4 Views Right  06/13/2012  *RADIOLOGY REPORT*  Clinical Data: Right knee pain.  RIGHT KNEE - COMPLETE 4+ VIEW  Comparison: No joint effusion or fracture.  Chondrocalcinosis is seen in the lateral compartment.  Findings: No joint effusion or fracture.  Chondrocalcinosis is seen in the lateral compartment.  IMPRESSION: 1.  No acute findings. 2.   Chondrocalcinosis in the lateral compartment of the knee.   Original Report Authenticated By: Leanna Battles, M.D.     MEDICATIONS: Scheduled Meds: . amLODipine  10 mg Oral Q breakfast  . calcium carbonate  1 tablet Oral Daily  .  ceFAZolin (ANCEF) IV  3 g Intravenous On Call to OR  . chlorhexidine  60 mL Topical Once  . cholecalciferol  6,000 Units Oral Daily  . docusate sodium  100 mg Oral BID  . gabapentin  300 mg Oral BID  . hydrochlorothiazide  25 mg Oral Q breakfast  . irbesartan  75 mg Oral Daily  . mometasone-formoterol  2 puff Inhalation BID  . naproxen  500 mg Oral Daily  . OxyCODONE  30 mg Oral Q12H  . pantoprazole  40 mg Oral Daily  . senna  1 tablet Oral BID  . simvastatin  20 mg Oral q1800   Continuous Infusions: . sodium chloride 75 mL/hr at 06/26/12 0158   PRN Meds:.acetaminophen, acetaminophen, albuterol, ALPRAZolam, alum & mag hydroxide-simeth, ipratropium, methocarbamol, morphine injection, oxyCODONE  Antibiotics: Anti-infectives   Start      Dose/Rate Route Frequency Ordered Stop   06/26/12 0600  ceFAZolin (ANCEF) 3 g in dextrose 5 % 50 mL IVPB     3 g 160 mL/hr over 30 Minutes Intravenous On call to O.R. 06/25/12 1349 06/27/12 0559       Jeoffrey Massed, MD  [facility] Pager:[phone]  If 7PM-7AM, please contact night-coverage [email or web address] Password [email or web address] 06/26/2012, 9:09 AM   LOS: 3 days

## 2012-06-26 NOTE — Anesthesia Procedure Notes (Signed)
Procedure Name: Intubation Date/Time: 06/26/2012 3:57 PM Performed by: Gwenyth Allegra Pre-anesthesia Checklist: Emergency Drugs available, Patient identified, Timeout performed and Suction available Patient Re-evaluated:Patient Re-evaluated prior to inductionOxygen Delivery Method: Circle system utilized Preoxygenation: Pre-oxygenation with 100% oxygen Ventilation: Mask ventilation without difficulty and Oral airway inserted - appropriate to patient size Laryngoscope Size: Mac and 3 Grade View: Grade I Number of attempts: 1 Airway Equipment and Method: Stylet Placement Confirmation: ETT inserted through vocal cords under direct vision,  breath sounds checked- equal and bilateral and positive ETCO2 Secured at: 21 cm Dental Injury: Teeth and Oropharynx as per pre-operative assessment

## 2012-06-26 NOTE — Progress Notes (Signed)
When leaving PACU, pt c/o right hand "feels a little numb". She has strong R HG, good cap refill, 3+ rad pulse. When we got pt to her rm, she said numbness had lessened. Report to [facility] RN who will cont to monitor.

## 2012-06-26 NOTE — Progress Notes (Signed)
Patient was seen and examined in the preop holding area. There has been no interval  Change in this patient's exam preop  history and physical exam  Lab tests and images have been examined and reviewed.  The Risks benefits and alternative treatments have been discussed  extensively,questions answered.  The patient has elected to undergo the discussed surgical treatment. 

## 2012-06-27 LAB — CBC
HCT: 37.4 % (ref 36.0–46.0)
MCV: 91.4 fL (ref 78.0–100.0)
RBC: 4.09 MIL/uL (ref 3.87–5.11)
RDW: 14.5 % (ref 11.5–15.5)
WBC: 11.7 10*3/uL — ABNORMAL HIGH (ref 4.0–10.5)

## 2012-06-27 LAB — BASIC METABOLIC PANEL
BUN: 10 mg/dL (ref 6–23)
CO2: 27 mEq/L (ref 19–32)
Chloride: 103 mEq/L (ref 96–112)
Creatinine, Ser: 0.76 mg/dL (ref 0.50–1.10)

## 2012-06-27 MED ORDER — ASPIRIN 325 MG PO TABS
325.0000 mg | ORAL_TABLET | Freq: Every day | ORAL | Status: DC
  Administered 2012-06-27 – 2012-06-28 (×2): 325 mg via ORAL
  Filled 2012-06-27 (×2): qty 1

## 2012-06-27 NOTE — Evaluation (Signed)
Occupational Therapy Evaluation Patient Details Name: [name] [name] MRN: [id number] DOB: 01-20-1936 Today's Date: 06/27/2012 Time: 4098-1191 OT Time Calculation (min): 26 min  OT Assessment / Plan / Recommendation Clinical Impression  77 yo female whom underwent L4-5 laminectomy and discectomy 06/26/12. Pt has h/o RLE/knee buckling and has fallen as a result, she should benefit from 1-2 visits acute OT for pt education re:ADL's, DME and functional mobility/transfers while adhering to back precautions.    OT Assessment  Patient needs continued OT Services    Follow Up Recommendations  No OT follow up    Barriers to Discharge      Equipment Recommendations  None recommended by OT    Recommendations for Other Services    Frequency  Min 2X/week    Precautions / Restrictions Precautions Precautions: Back Precaution Booklet Issued: Yes (comment) Precaution Comments: reviewed back precautions Restrictions Weight Bearing Restrictions: No   Pertinent Vitals/Pain 6/10 low back pain, pt was given pain meds by nursing during OT session and repositioned in chair.    ADL  Eating/Feeding: Performed;Modified independent Where Assessed - Eating/Feeding: Chair Grooming: Performed;Wash/dry hands;Set up Where Assessed - Grooming: Supported sitting Upper Body Bathing: Simulated;Set up (Min A for washing back) Where Assessed - Upper Body Bathing: Supported sitting Lower Body Bathing: Simulated;Maximal assistance Where Assessed - Lower Body Bathing: Supported sit to stand Upper Body Dressing: Simulated;Supervision/safety Where Assessed - Upper Body Dressing: Supported sitting Lower Body Dressing: Performed;Maximal assistance Where Assessed - Lower Body Dressing: Supported sit to stand;Supported sitting Toilet Transfer: Mining engineer Method: Sit to Barista: Raised toilet seat with arms (or 3-in-1 over toilet) Toileting - Clothing  Manipulation and Hygiene: Simulated;Minimal assistance Where Assessed - Engineer, mining and Hygiene: Standing Tub/Shower Transfer Method: Not assessed ADL Comments: Pt has been issued handout for back precautions and ADL's related to precautions were reviewed (ie. crossing legs one over the other to get to her feet vs use of A/E - handout issued and reviewed as well). Discussed use of built in shower seat at home and installing grab bar & hand held shower head. Pt reports that husband will assist with ADL's PRN at home after d/c.    OT Diagnosis: Generalized weakness;Acute pain  OT Problem List: Decreased activity tolerance;Decreased knowledge of use of DME or AE;Decreased knowledge of precautions;Pain OT Treatment Interventions: Self-care/ADL training;Energy conservation;DME and/or AE instruction;Patient/family education;Therapeutic activities   OT Goals Acute Rehab OT Goals OT Goal Formulation: With patient Time For Goal Achievement: 07/11/12 Potential to Achieve Goals: Good ADL Goals Pt Will Perform Grooming: with modified independence;Standing at sink;Sitting at sink ADL Goal: Grooming - Progress: Goal set today Pt Will Transfer to Toilet: with modified independence;Comfort height toilet;3-in-1;Grab bars;Maintaining back safety precautions ADL Goal: Toilet Transfer - Progress: Goal set today Pt Will Perform Toileting - Clothing Manipulation: with modified independence;Sitting on 3-in-1 or toilet;Standing ADL Goal: Toileting - Clothing Manipulation - Progress: Goal set today Pt Will Perform Toileting - Hygiene: with modified independence;Sitting on 3-in-1 or toilet;Other (comment) (While maintaining back precautions) ADL Goal: Toileting - Hygiene - Progress: Goal set today Pt Will Perform Tub/Shower Transfer: Tub transfer;Shower transfer;with supervision;Ambulation;Shower seat with back;Grab bars;Maintaining back safety precautions ADL Goal: Tub/Shower Transfer - Progress:  Goal set today  Visit Information  Last OT Received On: 06/27/12 Assistance Needed: +1    Subjective Data  Subjective: Pt reports husband to assist w/ ADL's PRN after d/c Patient Stated Goal: Home with husband   Prior Functioning  Home Living Lives With: Spouse Available Help at Discharge: Family;Available 24 hours/day Type of Home: House Home Access: Stairs to enter [employer] of Steps: 2 Entrance Stairs-Rails: None (husband to install one) Home Layout: One level Bathroom Shower/Tub: Walk-in shower;Door [employer] Toilet: Handicapped height Bathroom Accessibility: Yes How Accessible: Accessible via walker Home Adaptive Equipment: Built-in shower seat;Grab bars around toilet;Walker - rolling Prior Function Level of Independence: Independent Able to Take Stairs?: Yes Driving: Yes Vocation: Retired Comments: reports fell due to Rt knee giving out (had started with pain and then giving out x 4weeks) Communication Communication: 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 Dominant Hand: Right    Vision/Perception Vision - History Patient Visual Report: No change from baseline   Cognition  Cognition Overall Cognitive Status: Appears within functional limits for tasks assessed/performed Arousal/Alertness: Awake/alert Orientation Level: Oriented X4 / Intact Behavior During Session: [facility] for tasks performed    Extremity/Trunk Assessment Right Upper Extremity Assessment RUE ROM/Strength/Tone: Within functional levels RUE Sensation: WFL - Light Touch RUE Coordination: WFL - gross/fine motor Left Upper Extremity Assessment LUE ROM/Strength/Tone: Within functional levels LUE Sensation: WFL - Light Touch LUE Coordination: WFL - gross/fine motor Right Lower Extremity Assessment RLE ROM/Strength/Tone: Deficits;Due to pain RLE ROM/Strength/Tone Deficits: AROM slow and WFL; strength grossly 4/5 RLE Sensation: Deficits RLE Sensation Deficits: burning sensation (better than prior to  surgery) RLE Coordination: WFL - gross motor Left Lower Extremity Assessment LLE ROM/Strength/Tone: Deficits LLE ROM/Strength/Tone Deficits: AROM WFL; generalized weaknesss.  4/5 grossly LLE Sensation: WFL - Light Touch LLE Coordination: WFL - gross motor Trunk Assessment Trunk Assessment: Normal     Mobility Bed Mobility Bed Mobility: Sitting - Scoot to Edge of Bed;Left Sidelying to Sit;Rolling Left Rolling Left: 4: Min assist;With rail Left Sidelying to Sit: 4: Min assist;With rails;HOB flat Sitting - Scoot to [employer] of Bed: 4: Min guard Details for Bed Mobility Assistance: min assist to bend RLE prior to rolling; assist to fully turn onto her side; assist to raise torso due to pain Transfers Sit to Stand: 4: Min assist;With upper extremity assist;From bed Stand to Sit: 4: Min guard;With upper extremity assist;With armrests;To chair/3-in-1 Details for Transfer Assistance: vc for safe use of RW/hand placement; VC's for standing upright     Exercise General Exercises - Lower Extremity Ankle Circles/Pumps: AROM;Both;20 reps;Supine       End of Session OT - End of Session Equipment Utilized During Treatment: Gait belt Activity Tolerance: Patient tolerated treatment well Patient left: in chair;with call bell/phone within reach;with nursing in room;with family/visitor present  GO     Roselie Awkward Dixon 06/27/2012, 12:13 PM

## 2012-06-27 NOTE — Anesthesia Preprocedure Evaluation (Signed)
Anesthesia Evaluation  Patient identified by MRN, date of birth, ID band Patient awake    Reviewed: Allergy & Precautions, H&P , NPO status , Patient's Chart, lab work & pertinent test results  Airway Mallampati: I TM Distance: >3 FB Neck ROM: Full    Dental   Pulmonary asthma ,          Cardiovascular hypertension, Pt. on medications     Neuro/Psych    GI/Hepatic GERD-  Medicated and Controlled,  Endo/Other    Renal/GU      Musculoskeletal   Abdominal   Peds  Hematology   Anesthesia Other Findings   Reproductive/Obstetrics                           Anesthesia Physical Anesthesia Plan  ASA: II  Anesthesia Plan: General   Post-op Pain Management:    Induction: Intravenous  Airway Management Planned: Oral ETT  Additional Equipment:   Intra-op Plan:   Post-operative Plan: Extubation in OR  Informed Consent: I have reviewed the patients History and Physical, chart, labs and discussed the procedure including the risks, benefits and alternatives for the proposed anesthesia with the patient or authorized representative who has indicated his/her understanding and acceptance.     Plan Discussed with: CRNA and Surgeon  Anesthesia Plan Comments:         Anesthesia Quick Evaluation

## 2012-06-27 NOTE — Progress Notes (Signed)
CARE MANAGEMENT NOTE 06/27/2012  Patient:  BonnieBonnie   Account Number:  [id number]  Date Initiated:  06/27/2012  Documentation initiated by:  [facility]  Subjective/Objective Assessment:   admitted with back pain, had L4-5 lumbar laminectomy with microdiscectomy on 06/26/12     Action/Plan:   PT/OT- recommending HH  CM spoke with patient and husband. Choice offered for home health and DME. Patient has rolling walker and 3in1   Anticipated DC Date:  06/30/2012   Anticipated DC Plan:  HOME W HOME HEALTH SERVICES      DC Planning Services  CM consult      [facility] Choice  HOME HEALTH   Choice offered to / List presented to:  C-1 Patient        HH arranged  HH-2 PT      [facility] agency  [facility]   Status of service:  Completed, signed off Medicare Important Message given?   (If response is "NO", the following Medicare IM given date fields will be blank) Date Medicare IM given:   Date Additional Medicare IM given:    Discharge Disposition:  HOME W HOME HEALTH SERVICES  Per UR Regulation:  Reviewed for med. necessity/level of care/duration of stay  If discussed at Long Length of Stay Meetings, dates discussed:    Comments:

## 2012-06-27 NOTE — Progress Notes (Signed)
PATIENT DETAILS Name: [name] [name] Age: 77 y.o. Sex: female Date of Birth: 1936/02/12 Admit Date: 06/23/2012 Admitting Physician Sorin Luanne Bras, MD VHQ:[id number] DAVID, MD  Subjective: Pain improving from admission. Complaining of persistent right knee pain.   Assessment/Plan: Acute on chronic back pain  - MRI of lumbar spine without contrast done on 2/12- showed Extruded and sequestered appearing disc fragment in the right lateral recess-which could impact the L4 nerve root -Dr. Otelia Sergeant input appreciated, patient had lumbar laminectomy and decompression microdiscectomy on 06/26/2012.  -Appreciate IR input-but steroid injection has been cancelled-as patient went to OR. -C/w OxyContin 30 mg twice a day, use OxyIR and IV morphine for moderate and severe breakthrough pain. -PT today to determine patient's needs after discharge.  Hypertension - Stable on amlodipine, hydrochlorothiazide and Avapro  HYPERLIPIDEMIA - Continue with statins  History of coronary artery disease - Stable  GERD - Continue PPI  History of bronchial asthma - Lungs currently clear - Continue with the [facility] - As needed nebulized bronchodilators  Disposition: Remain inpatient  DVT Prophylaxis: Resume Prophylactic Lovenox post op-at the discretion of Ortho  Code Status: Full code    Procedures: lumbar laminectomy and decompression microdiscectomy on 06/26/2012  CONSULTS:  orthopedic surgery  PHYSICAL EXAM: Vital signs in last 24 hours: Filed Vitals:   06/26/12 2035 06/26/12 2040 06/26/12 2100 06/27/12 0619  BP: 138/60  165/62 150/68  Pulse:  90 95 85  Temp:  98.5 F (36.9 C) 98.1 F (36.7 C) 98.7 F (37.1 C)  TempSrc:   Oral   Resp:  15 18 18   Height:      Weight:      SpO2:  100% 100% 100%    Weight change:  Body mass index is 28.58 kg/(m^2).   Gen Exam: Awake and alert with clear speech.   Neck: Supple, No JVD.   Chest: B/L Clear.   CVS: S1 S2 Regular, no murmurs.  Abdomen:  soft, BS +, non tender, non distended.  Extremities: no edema, lower extremities warm to touch. Good right knee flexion and extension, no erythema. Neurologic: Non Focal.   Skin: No Rash.   Wounds: N/A.   Intake/Output from previous day:  Intake/Output Summary (Last 24 hours) at 06/27/12 0818 Last data filed at 06/26/12 2044  Gross per 24 hour  Intake   2330 ml  Output   1400 ml  Net    930 ml     LAB RESULTS: CBC  Recent Labs Lab 06/20/12 1100 06/23/12 1717 06/25/12 0935 06/26/12 0525 06/27/12 0535  WBC 10.3 12.3* 13.0* 11.0* 11.7*  HGB 14.6 14.8 13.6 13.0 12.1  HCT 43.4 43.4 40.9 39.6 37.4  PLT 411* 367 343 321 282  MCV 89.5 89.7 90.9 91.7 91.4  MCH 30.1 30.6 30.2 30.1 29.6  MCHC 33.6 34.1 33.3 32.8 32.4  RDW 14.1 14.5 14.6 14.4 14.5    Chemistries   Recent Labs Lab 06/20/12 1100 06/23/12 1717 06/26/12 0525 06/27/12 0535  NA 137 135 138 138  K 4.2 3.7 3.6 4.2  CL 99 100 100 103  CO2 25 27 29 27   GLUCOSE 141* 98 105* 98  BUN 24* 23 19 10   CREATININE 0.87 0.84 0.91 0.76  CALCIUM 9.4 9.5 8.9 8.3*    CBG: No results found for this basename: GLUCAP,  in the last 168 hours  GFR Estimated Creatinine Clearance: 63.7 ml/min (by C-G formula based on Cr of 0.76).  Coagulation profile  Recent Labs Lab 06/25/12 0935 06/26/12 0525  INR 0.87 0.85    Cardiac Enzymes No results found for this basename: CK, CKMB, TROPONINI, MYOGLOBIN,  in the last 168 hours  No components found with this basename: POCBNP,  No results found for this basename: DDIMER,  in the last 72 hours No results found for this basename: HGBA1C,  in the last 72 hours No results found for this basename: CHOL, HDL, LDLCALC, TRIG, CHOLHDL, LDLDIRECT,  in the last 72 hours No results found for this basename: TSH, T4TOTAL, FREET3, T3FREE, THYROIDAB,  in the last 72 hours No results found for this basename: VITAMINB12, FOLATE, FERRITIN, TIBC, IRON, RETICCTPCT,  in the last 72 hours No  results found for this basename: LIPASE, AMYLASE,  in the last 72 hours  Urine Studies No results found for this basename: UACOL, UAPR, USPG, UPH, UTP, UGL, UKET, UBIL, UHGB, UNIT, UROB, ULEU, UEPI, UWBC, URBC, UBAC, CAST, CRYS, UCOM, BILUA,  in the last 72 hours  MICROBIOLOGY: Recent Results (from the past 240 hour(s))  SURGICAL PCR SCREEN     Status: None   Collection Time    06/25/12  8:19 PM      Result Value Range Status   MRSA, PCR NEGATIVE  NEGATIVE Final   Staphylococcus aureus NEGATIVE  NEGATIVE Final   Comment:            The Xpert SA Assay (FDA     approved for NASAL specimens     in patients over 39 years of age),     is one component of     a comprehensive surveillance     program.  Test performance has     been validated by [employer] for patients greater     than or equal to 65 year old.     It is not intended     to diagnose infection nor to     guide or monitor treatment.    RADIOLOGY STUDIES/RESULTS: Dg Lumbar Spine 1 View  06/26/2012  *RADIOLOGY REPORT*  Clinical Data: Microdiskectomy L4-L5  DG C-ARM 1-60 MIN,LUMBAR SPINE - 1 VIEW  Technique: Intraoperative fluoroscopic spot images are submitted for interpretation.  Comparison:  MRI of the lumbar spine 06/20/2012  Findings: Single cross-table lateral view of the spine demonstrates a metallic probe positioned at the L5 spinous process.  Soft tissue spreaders are noted at the inferior endplate of L3 and superior endplate of L5.  IMPRESSION: Metallic probe positioned at the L5 spinous process as above.   Original Report Authenticated By: Malachy Moan, M.D.    Dg Chest Portable 1 View  06/26/2012  *RADIOLOGY REPORT*  Clinical Data: Preoperative chest x-ray prior to lumbar surgery  PORTABLE CHEST - 1 VIEW  Comparison: Prior chest x-ray 01/10/2012  Findings: Stable mild cardiomegaly and hiatal hernia.  Linear atelectasis versus scarring in the lingula.  Negative for focal consolidation, edema or suspicious  pulmonary nodule/mass.  IMPRESSION: No acute cardiopulmonary disease.   Original Report Authenticated By: Malachy Moan, M.D.    Dg C-arm 1-60 Min  06/26/2012  *RADIOLOGY REPORT*  Clinical Data: Microdiskectomy L4-L5  DG C-ARM 1-60 MIN,LUMBAR SPINE - 1 VIEW  Technique: Intraoperative fluoroscopic spot images are submitted for interpretation.  Comparison:  MRI of the lumbar spine 06/20/2012  Findings: Single cross-table lateral view of the spine demonstrates a metallic probe positioned at the L5 spinous process.  Soft tissue spreaders are noted at the inferior endplate of L3 and superior endplate of L5.  IMPRESSION: Metallic probe positioned at  the L5 spinous process as above.   Original Report Authenticated By: Malachy Moan, M.D.    MEDICATIONS: Scheduled Meds: . amLODipine  10 mg Oral Q breakfast  . calcium carbonate  1 tablet Oral Daily  .  ceFAZolin (ANCEF) IV  1 g Intravenous Q8H  . cholecalciferol  6,000 Units Oral Daily  . docusate sodium  100 mg Oral BID  . gabapentin  300 mg Oral BID  . hydrochlorothiazide  25 mg Oral Q breakfast  . irbesartan  75 mg Oral Daily  . mometasone-formoterol  2 puff Inhalation BID  . naproxen  500 mg Oral Daily  . OxyCODONE  30 mg Oral Q12H  . pantoprazole  40 mg Oral Daily  . senna  1 tablet Oral BID  . simvastatin  20 mg Oral q1800  . sodium chloride  3 mL Intravenous Q12H   Continuous Infusions: . sodium chloride 75 mL/hr at 06/26/12 0158  . sodium chloride     PRN Meds:.acetaminophen, acetaminophen, albuterol, ALPRAZolam, alum & mag hydroxide-simeth, HYDROcodone-acetaminophen, ipratropium, menthol-cetylpyridinium, methocarbamol, morphine injection, ondansetron (ZOFRAN) IV, oxyCODONE, phenol, polyethylene glycol, sodium chloride, sorbitol  Antibiotics: Anti-infectives   Start     Dose/Rate Route Frequency Ordered Stop   06/27/12 0000  ceFAZolin (ANCEF) IVPB 1 g/50 mL premix     1 g 100 mL/hr over 30 Minutes Intravenous Every 8 hours  06/26/12 2111 06/27/12 1559   06/26/12 0600  ceFAZolin (ANCEF) 3 g in dextrose 5 % 50 mL IVPB  Status:  Discontinued     3 g 160 mL/hr over 30 Minutes Intravenous On call to O.R. 06/25/12 1349 06/26/12 1429       Jamya Starry A, MD  [facility] Pager:[phone]  If 7PM-7AM, please contact night-coverage [email or web address] Password [facility] 06/27/2012, 8:18 AM   LOS: 4 days

## 2012-06-27 NOTE — Progress Notes (Signed)
[facility]  Patient Status: New  [facility] is providing the following services: PT  If patient discharges after hours, please call [phone].   Wynelle Bourgeois 06/27/2012, 4:00 PM

## 2012-06-27 NOTE — Progress Notes (Signed)
Patient examined and lab reviewed with Vernon PA-C. 

## 2012-06-27 NOTE — Progress Notes (Signed)
Subjective: 1 Day Post-Op Procedure(s) (LRB): LUMBAR LAMINECTOMY/DECOMPRESSION MICRODISCECTOMY (Right) Patient reports pain as mild.  Leg pain significantly improved.  Back pain as expected.  Objective: Vital signs in last 24 hours: Temp:  [98.1 F (36.7 C)-99.7 F (37.6 C)] 99.7 F (37.6 C) (02/19 0750) Pulse Rate:  [85-101] 98 (02/19 0750) Resp:  [9-18] 18 (02/19 0750) BP: (128-172)/(59-92) 128/62 mmHg (02/19 0750) SpO2:  [98 %-100 %] 98 % (02/19 0750)  Intake/Output from previous day: 02/18 0701 - 02/19 0700 In: 2330 [P.O.:30; I.V.:2300] Out: 1400 [Urine:1150; Blood:250] Intake/Output this shift: Total I/O In: 3 [I.V.:3] Out: 1400 [Urine:1400]   Recent Labs  06/25/12 0935 06/26/12 0525 06/27/12 0535  HGB 13.6 13.0 12.1    Recent Labs  06/26/12 0525 06/27/12 0535  WBC 11.0* 11.7*  RBC 4.32 4.09  HCT 39.6 37.4  PLT 321 282    Recent Labs  06/26/12 0525 06/27/12 0535  NA 138 138  K 3.6 4.2  CL 100 103  CO2 29 27  BUN 19 10  CREATININE 0.91 0.76  GLUCOSE 105* 98  CALCIUM 8.9 8.3*    Recent Labs  06/25/12 0935 06/26/12 0525  INR 0.87 0.85    Neurovascular intact Incision: dressing C/D/I  Assessment/Plan: 1 Day Post-Op Procedure(s) (LRB): LUMBAR LAMINECTOMY/DECOMPRESSION MICRODISCECTOMY (Right) Up with therapy for ambulation SCDs and aspirin adequate for VTE prophylaxis. No lovenox needed. Probable home tomorrow if comfortable and ambulating well.  [name] [name] M 06/27/2012, 12:47 PM

## 2012-06-27 NOTE — Plan of Care (Signed)
Problem: Consults Goal: Diagnosis - Spinal Surgery L45 Lumbar laminectomy/decompression and Microdiscectomy

## 2012-06-27 NOTE — Evaluation (Signed)
Physical Therapy Evaluation Patient Details Name: [name] [name] MRN: [id number] DOB: [birth year] Today's Date: 06/27/2012 Time: 7829-5621 PT Time Calculation (min): 24 min  PT Assessment / Plan / Recommendation Clinical Impression  77 yo s/p fall on 06/16/12 due to Rt knee buckling (had increasing pain and buckling x 4 weeks in Rt knee prior to this) with acute on chronic back and Rt leg pain. Pt underwent L4-5 laminectomy and discectomy 06/26/12 and requires PT for education in safe mobility with regards to back precautions and use of DME.     PT Assessment  Patient needs continued PT services    Follow Up Recommendations  Home health PT;Supervision for mobility/OOB    Does the patient have the potential to tolerate intense rehabilitation      Barriers to Discharge None      Equipment Recommendations  None recommended by PT    Recommendations for Other Services OT consult   Frequency Min 5X/week    Precautions / Restrictions Precautions Precautions: Back Precaution Booklet Issued: Yes (comment) Precaution Comments: reviewed back precautions Restrictions Weight Bearing Restrictions: No   Pertinent Vitals/Pain 6/10 back; pre-medicated; repositioned SaO2 97 % on RA after ambulation (pt educated on deep breathing as she tends to breath hold with pain/exertion)      Mobility  Bed Mobility Bed Mobility: Rolling Left;Left Sidelying to Sit;Sitting - Scoot to [place] of Bed Rolling Left: 4: Min assist;With rail Left Sidelying to Sit: 4: Min assist;With rails;HOB flat Sitting - Scoot to [place] of Bed: 4: Min guard Details for Bed Mobility Assistance: min assist to bend RLE prior to rolling; assist to fully turn onto her side; assist to raise torso due to pain Transfers Transfers: Sit to Stand;Stand to Sit Sit to Stand: 4: Min assist;With upper extremity assist;From bed Stand to Sit: 4: Min guard;With upper extremity assist;With armrests;To chair/3-in-1 Details for Transfer Assistance:  vc for safe use of RW/hand placement; assist to fully extend hips and knees and achieve upright positioning Ambulation/Gait Ambulation/Gait Assistance: 4: Min guard Ambulation Distance (Feet): 15 Feet Assistive device: Rolling walker Ambulation/Gait Assistance Details: vc for safe use of RW (especially with sequencing related to potential Rt knee buckling--as PTA); minguard due to reports of feeling "drunk" Gait Pattern: Step-to pattern;Decreased stride length;Right foot flat;Left foot flat;Right flexed knee in stance;Antalgic;Wide base of support Gait velocity: significantly decr    Exercises General Exercises - Lower Extremity Ankle Circles/Pumps: AROM;Both;20 reps;Supine   PT Diagnosis: Difficulty walking;Acute pain  PT Problem List: Decreased strength;Decreased activity tolerance;Decreased mobility;Decreased knowledge of use of DME;Decreased knowledge of precautions;Impaired sensation;Pain PT Treatment Interventions: DME instruction;Gait training;Stair training;Functional mobility training;Therapeutic activities;Patient/family education   PT Goals Acute Rehab PT Goals PT Goal Formulation: With patient/family Time For Goal Achievement: 07/03/12 Potential to Achieve Goals: Good Pt will Roll Supine to Left Side: with supervision PT Goal: Rolling Supine to Left Side - Progress: Goal set today Pt will go Supine/Side to Sit: with supervision;with HOB 0 degrees PT Goal: Supine/Side to Sit - Progress: Goal set today Pt will go Sit to Supine/Side: with supervision;with HOB 0 degrees PT Goal: Sit to Supine/Side - Progress: Goal set today Pt will go Sit to Stand: with supervision;with upper extremity assist PT Goal: Sit to Stand - Progress: Goal set today Pt will go Stand to Sit: with supervision;with upper extremity assist PT Goal: Stand to Sit - Progress: Goal set today Pt will Ambulate: 51 - 150 feet;with supervision;with least restrictive assistive device PT Goal: Ambulate - Progress:  Goal set today  Pt will Go Up / Down Stairs: 1-2 stairs;with min assist;with least restrictive assistive device PT Goal: Up/Down Stairs - Progress: Goal set today Additional Goals Additional Goal #1: Pt will verbalize and adhere to back precautions while mobilizing. PT Goal: Additional Goal #1 - Progress: Goal set today  Visit Information  Last PT Received On: 06/27/12 Assistance Needed: +1    Subjective Data  Subjective: I don't want to go home too soon. That's what happened last time. Patient Stated Goal: decr pain, be able to care for herself   Prior Functioning  Home Living Lives With: Spouse Available Help at Discharge: Family;Available 24 hours/day Type of Home: House Home Access: Stairs to enter [employer] of Steps: 2 Entrance Stairs-Rails: None (husband to install one) Home Layout: One level Bathroom Shower/Tub: Walk-in shower;Door [employer] Toilet: Handicapped height Bathroom Accessibility: Yes How Accessible: Accessible via walker Home Adaptive Equipment: Built-in shower seat;Grab bars around toilet;Walker - rolling Prior Function Level of Independence: Independent Able to Take Stairs?: Yes Driving: Yes Vocation: Retired Comments: reports fell due to Rt knee giving out (had started with pain and then giving out x 4weeks) Communication Communication: 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 Dominant Hand: Right    Cognition  Cognition Overall Cognitive Status: Appears within functional limits for tasks assessed/performed Arousal/Alertness: Awake/alert Orientation Level: Oriented X4 / Intact Behavior During Session: [facility] for tasks performed    Extremity/Trunk Assessment Right Lower Extremity Assessment RLE ROM/Strength/Tone: Deficits;Due to pain RLE ROM/Strength/Tone Deficits: AROM slow and WFL; strength grossly 4/5 RLE Sensation: Deficits RLE Sensation Deficits: burning sensation (better than prior to surgery) RLE Coordination: WFL - gross motor Left Lower Extremity  Assessment LLE ROM/Strength/Tone: Deficits LLE ROM/Strength/Tone Deficits: AROM WFL; generalized weaknesss.  4/5 grossly LLE Sensation: WFL - Light Touch LLE Coordination: WFL - gross motor Trunk Assessment Trunk Assessment: Normal       End of Session PT - End of Session Equipment Utilized During Treatment: Gait belt Activity Tolerance: Patient limited by pain Patient left: in chair;with call bell/phone within reach;Other (comment);with family/visitor present;with nursing in room (with OT ) Nurse Communication: Mobility status  GP     Remi Rester 06/27/2012, 10:20 AM Pager [phone]

## 2012-06-28 ENCOUNTER — Encounter (HOSPITAL_COMMUNITY): Payer: Self-pay | Admitting: Specialist

## 2012-06-28 MED ORDER — SENNA 8.6 MG PO TABS: 1.0000 | ORAL_TABLET | Freq: Two times a day (BID) | ORAL | Status: DC

## 2012-06-28 MED ORDER — HYDROCODONE-ACETAMINOPHEN 5-325 MG PO TABS: 1.0000 | ORAL_TABLET | ORAL | Status: DC | PRN

## 2012-06-28 MED ORDER — NAPROXEN 500 MG PO TABS: 500.0000 mg | ORAL_TABLET | Freq: Two times a day (BID) | ORAL | Status: DC

## 2012-06-28 MED ORDER — METHOCARBAMOL 500 MG PO TABS: 500.0000 mg | ORAL_TABLET | Freq: Four times a day (QID) | ORAL | Status: DC | PRN

## 2012-06-28 NOTE — Progress Notes (Signed)
Pt provided with discharge instructions and follow up information. She is aware of restrictions and will be [facility] by [facility] for [facility]. She is discharging home with her husband.

## 2012-06-28 NOTE — Progress Notes (Signed)
Patient ID: [name] [name], female   DOB: [birth year], 77 y.o.   MRN: [id number] Subjective: 2 Days Post-Op Procedure(s) (LRB): LUMBAR LAMINECTOMY/DECOMPRESSION MICRODISCECTOMY (Right) Awake,alert Ox4. I still have some pain in my back. Leg pain much improved.  Patient reports pain as mild.    Objective:   VITALS:  Temp:  [98.2 F (36.8 C)-99.2 F (37.3 C)] 99.2 F (37.3 C) (02/20 0600) Pulse Rate:  [86-88] 86 (02/20 0600) Resp:  [18] 18 (02/20 0600) BP: (114-132)/(60-64) 130/64 mmHg (02/20 0600) SpO2:  [95 %-98 %] 96 % (02/20 0600)  Neurologically intact ABD soft Sensation intact distally Intact pulses distally Dorsiflexion/Plantar flexion intact Incision: no drainage No cellulitis present New dressing applied.  LABS  Recent Labs  06/25/12 0935 06/26/12 0525 06/27/12 0535  HGB 13.6 13.0 12.1  WBC 13.0* 11.0* 11.7*  PLT 343 321 282    Recent Labs  06/26/12 0525 06/27/12 0535  NA 138 138  K 3.6 4.2  CL 100 103  CO2 29 27  BUN 19 10  CREATININE 0.91 0.76  GLUCOSE 105* 98    Recent Labs  06/25/12 0935 06/26/12 0525  INR 0.87 0.85     Assessment/Plan: 2 Days Post-Op Procedure(s) (LRB): LUMBAR LAMINECTOMY/DECOMPRESSION MICRODISCECTOMY (Right)  Advance diet Up with therapy Discharge home with home health Orthopaedically stable and ready for discharge home with [facility] for PT/OT Discussed limits of 5 lbs lifting avoiding bending, stooping and twisting. Walking in house for 2 weeks then progressive ambulation over 4 weeks. Loose weight and develop a daily exercise program.  NITKA,JAMES E 06/28/2012, 8:05 AM

## 2012-06-28 NOTE — Progress Notes (Signed)
PATIENT DETAILS Name: [name] [name] Age: 77 y.o. Sex: female Date of Birth: 1935/07/26 Admit Date: 06/23/2012 Admitting Physician Sorin Luanne Bras, MD ZOX:[id number] DAVID, MD  Subjective: Having pain, but continues to improve.   Assessment/Plan: Acute on chronic back pain  Patient had lumbar laminectomy and decompression microdiscectomy on 06/26/2012.  C/w OxyContin 30 mg twice a day, use OxyIR and IV morphine for moderate and severe breakthrough pain. Continue PT.  Hypertension Stable on amlodipine, hydrochlorothiazide and Avapro  HYPERLIPIDEMIA Continue with statins  History of coronary artery disease Stable  GERD Continue PPI  History of bronchial asthma Lungs currently clear Continue with the [facility] As needed nebulized bronchodilators  Disposition: DC home today as per ortho.  DVT Prophylaxis: Resume Prophylactic Lovenox post op-at the discretion of Ortho  Code Status: Full code    Procedures: lumbar laminectomy and decompression microdiscectomy on 06/26/2012  CONSULTS:  orthopedic surgery  PHYSICAL EXAM: Vital signs in last 24 hours: Filed Vitals:   06/27/12 1507 06/27/12 2053 06/27/12 2119 06/28/12 0600  BP: 114/60  132/63 130/64  Pulse: 88  88 86  Temp: 98.2 F (36.8 C)  98.3 F (36.8 C) 99.2 F (37.3 C)  TempSrc:      Resp: 18  18 18   Height:      Weight:      SpO2: 96% 98% 95% 96%    Weight change:  Body mass index is 28.58 kg/(m^2).   Gen Exam: Awake and alert with clear speech.   Neck: Supple, No JVD.   Chest: B/L Clear.   CVS: S1 S2 Regular, no murmurs.  Abdomen: soft, BS +, non tender, non distended.  Extremities: no edema, lower extremities warm to touch. Neurologic: Non Focal.   Skin: No Rash.   Wounds: N/A.   Intake/Output from previous day:  Intake/Output Summary (Last 24 hours) at 06/28/12 0904 Last data filed at 06/28/12 0200  Gross per 24 hour  Intake   3903 ml  Output      0 ml  Net   3903 ml     LAB  RESULTS: CBC  Recent Labs Lab 06/23/12 1717 06/25/12 0935 06/26/12 0525 06/27/12 0535  WBC 12.3* 13.0* 11.0* 11.7*  HGB 14.8 13.6 13.0 12.1  HCT 43.4 40.9 39.6 37.4  PLT 367 343 321 282  MCV 89.7 90.9 91.7 91.4  MCH 30.6 30.2 30.1 29.6  MCHC 34.1 33.3 32.8 32.4  RDW 14.5 14.6 14.4 14.5    Chemistries   Recent Labs Lab 06/23/12 1717 06/26/12 0525 06/27/12 0535  NA 135 138 138  K 3.7 3.6 4.2  CL 100 100 103  CO2 27 29 27   GLUCOSE 98 105* 98  BUN 23 19 10   CREATININE 0.84 0.91 0.76  CALCIUM 9.5 8.9 8.3*    CBG: No results found for this basename: GLUCAP,  in the last 168 hours  GFR Estimated Creatinine Clearance: 63.7 ml/min (by C-G formula based on Cr of 0.76).  Coagulation profile  Recent Labs Lab 06/25/12 0935 06/26/12 0525  INR 0.87 0.85    Cardiac Enzymes No results found for this basename: CK, CKMB, TROPONINI, MYOGLOBIN,  in the last 168 hours  No components found with this basename: POCBNP,  No results found for this basename: DDIMER,  in the last 72 hours No results found for this basename: HGBA1C,  in the last 72 hours No results found for this basename: CHOL, HDL, LDLCALC, TRIG, CHOLHDL, LDLDIRECT,  in the last 72 hours No results found for this basename:  TSH, T4TOTAL, FREET3, T3FREE, THYROIDAB,  in the last 72 hours No results found for this basename: VITAMINB12, FOLATE, FERRITIN, TIBC, IRON, RETICCTPCT,  in the last 72 hours No results found for this basename: LIPASE, AMYLASE,  in the last 72 hours  Urine Studies No results found for this basename: UACOL, UAPR, USPG, UPH, UTP, UGL, UKET, UBIL, UHGB, UNIT, UROB, ULEU, UEPI, UWBC, URBC, UBAC, CAST, CRYS, UCOM, BILUA,  in the last 72 hours  MICROBIOLOGY: Recent Results (from the past 240 hour(s))  SURGICAL PCR SCREEN     Status: None   Collection Time    06/25/12  8:19 PM      Result Value Range Status   MRSA, PCR NEGATIVE  NEGATIVE Final   Staphylococcus aureus NEGATIVE  NEGATIVE Final    Comment:            The Xpert SA Assay (FDA     approved for NASAL specimens     in patients over 88 years of age),     is one component of     a comprehensive surveillance     program.  Test performance has     been validated by [employer] for patients greater     than or equal to 55 year old.     It is not intended     to diagnose infection nor to     guide or monitor treatment.    RADIOLOGY STUDIES/RESULTS: Dg Lumbar Spine 1 View  06/26/2012  *RADIOLOGY REPORT*  Clinical Data: Microdiskectomy L4-L5  DG C-ARM 1-60 MIN,LUMBAR SPINE - 1 VIEW  Technique: Intraoperative fluoroscopic spot images are submitted for interpretation.  Comparison:  MRI of the lumbar spine 06/20/2012  Findings: Single cross-table lateral view of the spine demonstrates a metallic probe positioned at the L5 spinous process.  Soft tissue spreaders are noted at the inferior endplate of L3 and superior endplate of L5.  IMPRESSION: Metallic probe positioned at the L5 spinous process as above.   Original Report Authenticated By: Malachy Moan, M.D.    Dg C-arm 1-60 Min  06/26/2012  *RADIOLOGY REPORT*  Clinical Data: Microdiskectomy L4-L5  DG C-ARM 1-60 MIN,LUMBAR SPINE - 1 VIEW  Technique: Intraoperative fluoroscopic spot images are submitted for interpretation.  Comparison:  MRI of the lumbar spine 06/20/2012  Findings: Single cross-table lateral view of the spine demonstrates a metallic probe positioned at the L5 spinous process.  Soft tissue spreaders are noted at the inferior endplate of L3 and superior endplate of L5.  IMPRESSION: Metallic probe positioned at the L5 spinous process as above.   Original Report Authenticated By: Malachy Moan, M.D.    MEDICATIONS: Scheduled Meds: . amLODipine  10 mg Oral Q breakfast  . aspirin  325 mg Oral Daily  . calcium carbonate  1 tablet Oral Daily  . cholecalciferol  6,000 Units Oral Daily  . docusate sodium  100 mg Oral BID  . gabapentin  300 mg Oral BID  .  hydrochlorothiazide  25 mg Oral Q breakfast  . irbesartan  75 mg Oral Daily  . mometasone-formoterol  2 puff Inhalation BID  . naproxen  500 mg Oral Daily  . OxyCODONE  30 mg Oral Q12H  . pantoprazole  40 mg Oral Daily  . senna  1 tablet Oral BID  . simvastatin  20 mg Oral q1800  . sodium chloride  3 mL Intravenous Q12H   Continuous Infusions: . sodium chloride 75 mL/hr at 06/27/12 1124  .  sodium chloride     PRN Meds:.acetaminophen, acetaminophen, albuterol, ALPRAZolam, alum & mag hydroxide-simeth, HYDROcodone-acetaminophen, ipratropium, menthol-cetylpyridinium, methocarbamol, morphine injection, ondansetron (ZOFRAN) IV, oxyCODONE, phenol, polyethylene glycol, sodium chloride, sorbitol  Antibiotics: Anti-infectives   Start     Dose/Rate Route Frequency Ordered Stop   06/27/12 0000  ceFAZolin (ANCEF) IVPB 1 g/50 mL premix     1 g 100 mL/hr over 30 Minutes Intravenous Every 8 hours 06/26/12 2111 06/27/12 0909   06/26/12 0600  ceFAZolin (ANCEF) 3 g in dextrose 5 % 50 mL IVPB  Status:  Discontinued     3 g 160 mL/hr over 30 Minutes Intravenous On call to O.R. 06/25/12 1349 06/26/12 1429       Tomoki Lucken A, MD  [facility] Pager:[phone]  If 7PM-7AM, please contact night-coverage [email or web address] Password [email or web address] 06/28/2012, 9:04 AM   LOS: 5 days

## 2012-06-28 NOTE — Discharge Summary (Signed)
Physician Discharge Summary  Patient ID: [name] [name] MRN: [id number] DOB/AGE: November 05, 1935 77 y.o.  Admit date: 06/23/2012 Discharge date: 06/28/2012  Admission Diagnoses:  Herniated lumbar intervertebral disc right L4-5  Discharge Diagnoses:  Principal Problem:   Herniated lumbar intervertebral disc Active Problems:   HYPERLIPIDEMIA   HYPERTENSION   CAD, UNSPECIFIED SITE   ASTHMA   DIVERTICULOSIS, COLON   Acute back pain with sciatica   Vertebral compression fracture   Past Medical History  Diagnosis Date  . CAD (coronary artery disease)     LHC 2003 with 40% pLAD, 30% mLAD, 100% D1 (moderate vessel), 100%  D2 (small vessel), 95% mid small OM2.  Pt had PTCA to D1;  D2 and OM2 small vessels and tx medically;  Last Myoview (5/09):  EF 66%, anteroseptal/apical infarct with minimal peri-infarct ischemia.  No significant change from 11/06. Pt managed medically  . Recurrent aspiration bronchitis/pneumonia     PFTs 12/09 FVC 97%, FEV1 101%, ratio 77%, TLC 109%. No significant obstruction or restriction.   . Hypertension   . Hyperlipidemia   . Esophageal stricture   . Diverticulosis of colon   . Gastroesophageal reflux disease with hiatal hernia   . Iron deficiency anemia   . Gastritis   . GERD (gastroesophageal reflux disease)   . Hiatal hernia   . Asthma   . Blood transfusion   . Cough   . Wheezing   . Weakness   . Trouble swallowing   . Abdominal pain   . Hiatal hernia   . Shortness of breath     Surgeries: Procedure(s): LUMBAR LAMINECTOMY/DECOMPRESSION MICRODISCECTOMY RIGHT L4-5 on 06/23/2012 - 06/26/2012   Consultants (if any):  Vira Browns  Discharged Condition: Improved  Hospital Course: [name] [name] is an 77 y.o. female who was admitted 06/23/2012 with a diagnosis of Herniated lumbar intervertebral disc and went to the operating room on 06/23/2012 - 06/26/2012 and underwent the above named procedures.  History:   Admitted to [facility] 06/19/2012 with worsening right leg  pain.  Underwent MRI showing right sided L4-5 HNP. Discharged on 2/14 but returned to the hospital with intractable right lower extremity pain. Unable to find comfortable position either lying or standing. Pain not controlled on oral narcotic.Readmitted 2/15 for pain control. MRI shows extruded HNP right L4-5 with right L4 nerve compression. Grade 1 anterolisthesis L4-5.  She had significant physical limitation due to right lumbar radiculopathy and was requiring IV and po narcotics.  She was unable to care for herself due to pain and physical weakness right leg.As she was unable to stand or walk and has significant weakness with neural tension signs a microdiskectomy would provide her with a better 95% chance of relief of leg pain and improved function  She was given perioperative antibiotics:  Anti-infectives   Start     Dose/Rate Route Frequency Ordered Stop   06/27/12 0000  ceFAZolin (ANCEF) IVPB 1 g/50 mL premix     1 g 100 mL/hr over 30 Minutes Intravenous Every 8 hours 06/26/12 2111 06/27/12 0909   06/26/12 0600  ceFAZolin (ANCEF) 3 g in dextrose 5 % 50 mL IVPB  Status:  Discontinued     3 g 160 mL/hr over 30 Minutes Intravenous On call to O.R. 06/25/12 1349 06/26/12 1429    .  She was given sequential compression devices, early ambulation, and aspirin for DVT prophylaxis post op with aspirin discontinued at discharge as she will be using Aleve to assist with pain control. Pt ambulated with PT and  progressed well.  The right leg pain was relieved and strength restored to the right leg. Wound was healing well and dressing changed before discharge. Bowel function and bladder function appropriate at discharge.  Pt taking a regular diet. Pain controlled with po analgesics.  She benefited maximally from the hospital stay and there were no complications.    Recent vital signs:  Filed Vitals:   06/28/12 0600  BP: 130/64  Pulse: 86  Temp: 99.2 F (37.3 C)  Resp: 18    Recent laboratory  studies:  Lab Results  Component Value Date   HGB 12.1 06/27/2012   HGB 13.0 06/26/2012   HGB 13.6 06/25/2012   Lab Results  Component Value Date   WBC 11.7* 06/27/2012   PLT 282 06/27/2012   Lab Results  Component Value Date   INR 0.85 06/26/2012   Lab Results  Component Value Date   NA 138 06/27/2012   K 4.2 06/27/2012   CL 103 06/27/2012   CO2 27 06/27/2012   BUN 10 06/27/2012   CREATININE 0.76 06/27/2012   GLUCOSE 98 06/27/2012    Discharge Medications:     Medication List    TAKE these medications       ALPRAZolam 0.5 MG tablet  Commonly known as:  XANAX  Take 0.5 mg by mouth at bedtime as needed. For sleep     amLODipine 10 MG tablet  Commonly known as:  NORVASC  Take 10 mg by mouth daily with breakfast.     calcium carbonate 500 MG chewable tablet  Commonly known as:  TUMS - dosed in mg elemental calcium  Chew 1 tablet by mouth daily as needed. For heartburn     CORAL CALCIUM PO  Take 1 tablet by mouth daily.     gabapentin 300 MG capsule  Commonly known as:  NEURONTIN  Take 300 mg by mouth at bedtime.     hydrochlorothiazide 25 MG tablet  Commonly known as:  HYDRODIURIL  Take 25 mg by mouth daily with breakfast.     HYDROcodone-acetaminophen 5-325 MG per tablet  Commonly known as:  NORCO/VICODIN  Take 1-2 tablets by mouth every 6 (six) hours as needed.     HYDROcodone-acetaminophen 5-325 MG per tablet  Commonly known as:  NORCO/VICODIN  Take 1-2 tablets by mouth every 4 (four) hours as needed.     ipratropium-albuterol 0.5-2.5 (3) MG/3ML Soln  Commonly known as:  DUONEB  Take 3 mLs by nebulization 4 (four) times daily as needed. For shortness of breath     methocarbamol 500 MG tablet  Commonly known as:  ROBAXIN  Take 1 tablet (500 mg total) by mouth every 6 (six) hours as needed.     mometasone-formoterol 200-5 MCG/ACT Aero  Commonly known as:  DULERA  Inhale 2 puffs into the lungs 2 (two) times daily.     naproxen 500 MG tablet  Commonly known  as:  NAPROSYN  Take 1 tablet (500 mg total) by mouth 2 (two) times daily with a meal.     naproxen 500 MG tablet  Commonly known as:  NAPROSYN  Take 1 tablet (500 mg total) by mouth 2 (two) times daily with a meal.     olmesartan 20 MG tablet  Commonly known as:  BENICAR  Take 1 tablet (20 mg total) by mouth daily.     omeprazole 20 MG capsule  Commonly known as:  PRILOSEC  Take 20 mg by mouth daily.     pravastatin 40 MG  tablet  Commonly known as:  PRAVACHOL  Take 40 mg by mouth at bedtime.     senna 8.6 MG Tabs  Commonly known as:  SENOKOT  Take 1 tablet (8.6 mg total) by mouth 2 (two) times daily.     VITAMIN B 12 PO  Take 1 tablet by mouth daily.     Vitamin D 1000 UNITS capsule  Take 6,000 Units by mouth daily.        Diagnostic Studies: Ct Abdomen Pelvis Wo Contrast  06/20/2012  *RADIOLOGY REPORT*  Clinical Data: Back pain.  Fall.  CT ABDOMEN AND PELVIS WITHOUT CONTRAST  Technique:  Multidetector CT imaging of the abdomen and pelvis was performed following the standard protocol without intravenous contrast.  Comparison: 04/26/2011  Findings: Hiatal hernia wrap noted above the diaphragm.  The visualized portion of the liver, spleen, pancreas, and adrenal glands appear unremarkable in noncontrast CT appearance.  The kidneys appear unremarkable, as do the proximal ureters.  Gallbladder surgically absent.  Common bile duct approximately 1.1 cm in diameter.  No pathologic retroperitoneal or porta hepatis adenopathy is identified.  Aortoiliac atherosclerotic calcification is present.  Borderline prominence stool noted in the colon, particularly the cecum.  Appendix absent.  Uterus absent.  The ovaries unremarkable.  Urinary bladder appears unremarkable.  No significant change in the L1 compression fracture.  Grade 1 anterolisthesis at L4-5 is again noted with degenerative facet arthropathy, but without pars defects.  Posterior decompression is again noted at the L5-S1 level.   Suspected disc bulge at L4-5, similar to prior.  IMPRESSION:  1.  Unchanged appearance of L1 compression fracture and lumbar spondylosis and degenerative disc disease. 2.  Borderline prominence of stool in the colon. 3.  Atherosclerosis. 4.  Hiatal hernia wrap noted above the diaphragm.   Original Report Authenticated By: Gaylyn Rong, M.D.    Dg Hip Complete Right  06/13/2012  *RADIOLOGY REPORT*  Clinical Data: Right hip pain.  RIGHT HIP - COMPLETE 2+ VIEW  Comparison: 06/04/2012.  Findings: No fracture or dislocation.  Mild subchondral sclerosis in the hips bilaterally.  Mild superior joint space narrowing in the left hip.  IMPRESSION:  1.  No acute findings. 2.  Mild degenerative changes in the left hip.   Original Report Authenticated By: Leanna Battles, M.D.    Dg Hip Complete Right  06/04/2012  *RADIOLOGY REPORT*  Clinical Data: Right hip pain  RIGHT HIP - COMPLETE 2+ VIEW  Comparison: 03/19/2008  Findings: No displaced fracture identified.  No dislocation.  No aggressive osseous lesions. Lower lumbar degenerative change. Overlying soft tissues unremarkable.  IMPRESSION: No displaced fracture identified. If clinical concern for fracture persists, MRI follow-up recommended (which has increased sensitivity for nondisplaced fracture).   Original Report Authenticated By: Jearld Lesch, M.D.    Dg Femur Right  06/19/2012  *RADIOLOGY REPORT*  Clinical Data: Status post multiple falls; right leg pain.  RIGHT FEMUR - 2 VIEW  Comparison: Right hip radiographs performed 06/13/2012  Findings: The right femur appears intact.  There is no evidence of fracture or dislocation.  The right femoral head remains seated at the acetabulum.  The right knee joint is grossly unremarkable in appearance.  No knee joint effusion is identified.  The visualized bowel gas pattern is grossly unremarkable.  No significant soft tissue abnormalities are seen on radiograph.  IMPRESSION: No evidence of fracture or dislocation.    Original Report Authenticated By: Tonia Ghent, M.D.    Mr Lumbar Spine Wo Contrast  06/20/2012  *  RADIOLOGY REPORT*  Clinical Data: Chronic back pain. History of prior surgery.  Right leg pain.  Status post fall  MRI LUMBAR SPINE WITHOUT CONTRAST  Technique:  Multiplanar and multiecho pulse sequences of the lumbar spine were obtained without intravenous contrast.  Comparison: CT abdomen and pelvis 06/20/2012 and 04/26/2011.  Findings: The patient has 0.5 cm of anterolisthesis of L4 on L5 due to advanced facet degenerative disease.  Remote T12 and L1 compression fractures are identified.  Vertebral body height loss of T12 is estimated 20% centrally.  More severe compression fracture L1 is also seen with some bony retropulsion as described below.  Vertebral body height is otherwise maintained.  No worrisome marrow lesion is identified.  The conus medullaris is normal in signal and position. Imaged intra-abdominal contents are unremarkable.  The T10-11 level is imaged in the sagittal plane only and negative.  T11-12:  Negative.  T12-L1:  Mild bony retropulsion off the superior plate of L1 is seen but the central spinal canal and neural foramina are widely patent.  L1-2:  Minimal disc bulge without central canal or foraminal narrowing.  L2-3:  Negative.  L3-4:  There is ligamentum flavum thickening and a shallow disc bulge with moderate facet degenerative change.  Central canal is mildly narrowed.  Foramina are open.  L4-5:  Advanced facet degenerative change is present.  The disc is uncovered with a bulge.  There is a superimposed right lateral recess cephalad extrusion with a sequestered fragment extending just into the right foramen.  The disc fragment measures 0.8 cm cranial-caudal by 0.6 cm AP by 0.8 cm transverse and could impact the descending and exiting right L4 root.  The central canal is only mildly narrowed.  Mild to moderate left foraminal narrowing is identified.  L5-S1:  Small disc osteophyte complex.   The patient is status post posterior decompression.  Facet arthropathy noted.  Central canal is open.  Foramina are mildly narrowed.  IMPRESSION:  1.  Extruded and sequestered appearing disc fragment in the right lateral recess just cephalad to the L4-5 disc interspace could impact the descending and exiting right L4 root. 2.  Advanced facet arthropathy at L4-5 results in 0.5 cm of anterolisthesis.  Central canal is mildly narrowed this level. 3.  Remote T12 and L1 compression fractures. 4.  Status post posterior decompression L5-S1.  Central canal is open.  Foramina patent.   Original Report Authenticated By: Holley Dexter, M.D.    Dg Lumbar Spine 1 View  06/26/2012  *RADIOLOGY REPORT*  Clinical Data: Microdiskectomy L4-L5  DG C-ARM 1-60 MIN,LUMBAR SPINE - 1 VIEW  Technique: Intraoperative fluoroscopic spot images are submitted for interpretation.  Comparison:  MRI of the lumbar spine 06/20/2012  Findings: Single cross-table lateral view of the spine demonstrates a metallic probe positioned at the L5 spinous process.  Soft tissue spreaders are noted at the inferior endplate of L3 and superior endplate of L5.  IMPRESSION: Metallic probe positioned at the L5 spinous process as above.   Original Report Authenticated By: Malachy Moan, M.D.    Dg Chest Portable 1 View  06/26/2012  *RADIOLOGY REPORT*  Clinical Data: Preoperative chest x-ray prior to lumbar surgery  PORTABLE CHEST - 1 VIEW  Comparison: Prior chest x-ray 01/10/2012  Findings: Stable mild cardiomegaly and hiatal hernia.  Linear atelectasis versus scarring in the lingula.  Negative for focal consolidation, edema or suspicious pulmonary nodule/mass.  IMPRESSION: No acute cardiopulmonary disease.   Original Report Authenticated By: Malachy Moan, M.D.    Dg  Knee Complete 4 Views Right  06/13/2012  *RADIOLOGY REPORT*  Clinical Data: Right knee pain.  RIGHT KNEE - COMPLETE 4+ VIEW  Comparison: No joint effusion or fracture.  Chondrocalcinosis  is seen in the lateral compartment.  Findings: No joint effusion or fracture.  Chondrocalcinosis is seen in the lateral compartment.  IMPRESSION: 1.  No acute findings. 2.   Chondrocalcinosis in the lateral compartment of the knee.   Original Report Authenticated By: Leanna Battles, M.D.    Dg C-arm 1-60 Min  06/26/2012  *RADIOLOGY REPORT*  Clinical Data: Microdiskectomy L4-L5  DG C-ARM 1-60 MIN,LUMBAR SPINE - 1 VIEW  Technique: Intraoperative fluoroscopic spot images are submitted for interpretation.  Comparison:  MRI of the lumbar spine 06/20/2012  Findings: Single cross-table lateral view of the spine demonstrates a metallic probe positioned at the L5 spinous process.  Soft tissue spreaders are noted at the inferior endplate of L3 and superior endplate of L5.  IMPRESSION: Metallic probe positioned at the L5 spinous process as above.   Original Report Authenticated By: Malachy Moan, M.D.     Disposition: 01-Home or Self Care      Discharge Orders   Future Appointments Provider Department Dept Phone   07/16/2012 1:30 PM Oretha Milch, MD [facility] [phone]   Future Orders Complete By Expires     Call MD / Call 911  As directed     Comments:      If you experience chest pain or shortness of breath, CALL 911 and be transported to the hospital emergency room.  If you develope a fever above 101 F, pus (white drainage) or increased drainage or redness at the wound, or calf pain, call your surgeon's office.    Constipation Prevention  As directed     Comments:      Drink plenty of fluids.  Prune juice may be helpful.  You may use a stool softener, such as Colace (over the counter) 100 mg twice a day.  Use MiraLax (over the counter) for constipation as needed.    Diet - low sodium heart healthy  As directed     Discharge instructions  As directed     Comments:      No lifting greater than 5 lbs. Avoid bending, stooping and twisting. Walk in house for first 2 weeks then may  start to get out slowly increasing distance up to one mile by 6 weeks post op. Keep incision dry for 3 days, may use tegaderm or similar water impervious dressing.    Driving restrictions  As directed     Comments:      No driving for 3 weeks    Increase activity slowly as tolerated  As directed     Lifting restrictions  As directed     Comments:      No lifting for 6 weeks       Follow-up Information   Follow up with Czar Ysaguirre E, MD In 2 weeks.   Contact information:   [address] [place] [address] [phone]        Signed: Wende Neighbors 06/28/2012, 8:33 AM

## 2012-06-28 NOTE — Progress Notes (Signed)
Physical Therapy Treatment Patient Details Name: [name] [name] MRN: [id number] DOB: December 11, 1935 Today's Date: 06/28/2012 Time: 4098-1191 PT Time Calculation (min): 25 min  PT Assessment / Plan / Recommendation Comments on Treatment Session  Patient able to progress very well with ambulation this session and complete step training. Patient is eager to hopefully discharge today.     Follow Up Recommendations  Home health PT;Supervision for mobility/OOB     Does the patient have the potential to tolerate intense rehabilitation     Barriers to Discharge        Equipment Recommendations  Rolling walker with 5" wheels    Recommendations for Other Services    Frequency Min 5X/week   Plan      Precautions / Restrictions Precautions Precautions: Back Precaution Booklet Issued: Yes (comment) Precaution Comments: reviewed back precautions Restrictions Weight Bearing Restrictions: No   Pertinent Vitals/Pain no apparent distress     Mobility  Bed Mobility Bed Mobility: Not assessed Transfers Sit to Stand: 5: Supervision;From chair/3-in-1;With upper extremity assist Stand to Sit: 5: Supervision;With upper extremity assist;To chair/3-in-1 Details for Transfer Assistance: Patient did not require cues for hand placement. Supervision for safety Ambulation/Gait Ambulation/Gait Assistance: 4: Min guard Ambulation Distance (Feet): 200 Feet Assistive device: Rolling walker Ambulation/Gait Assistance Details: Patient able to increase ambulation. Cues required to ambulate with step through gait pattern and with erect posture.  Gait Pattern: Step-through pattern;Decreased stride length;Trunk flexed Gait velocity: increasing to [facility] with cueing Stairs Assistance: 5: Supervision Stair Management Technique: Step to pattern;Forwards;With walker Number of Stairs: 1    Exercises     PT Diagnosis:    PT Problem List:   PT Treatment Interventions:     PT Goals Acute Rehab PT Goals PT Goal:  Sit to Stand - Progress: Progressing toward goal PT Goal: Stand to Sit - Progress: Progressing toward goal PT Goal: Ambulate - Progress: Progressing toward goal Additional Goals PT Goal: Additional Goal #1 - Progress: Progressing toward goal  Visit Information  Assistance Needed: +1    Subjective Data      Cognition  Cognition Overall Cognitive Status: Appears within functional limits for tasks assessed/performed Arousal/Alertness: Awake/alert Orientation Level: Oriented X4 / Intact Behavior During Session: [facility] for tasks performed    Balance     End of Session PT - End of Session Equipment Utilized During Treatment: Gait belt Patient left: in chair;with call bell/phone within reach;with family/visitor present Nurse Communication: Mobility status   GP     Fredrich Birks 06/28/2012, 11:00 AM  06/28/2012 Fredrich Birks PTA [phone] pager [phone] office

## 2012-07-16 ENCOUNTER — Ambulatory Visit: Payer: Medicare Other | Admitting: Pulmonary Disease

## 2012-08-01 ENCOUNTER — Encounter (HOSPITAL_COMMUNITY): Payer: Self-pay

## 2012-08-01 ENCOUNTER — Emergency Department (HOSPITAL_COMMUNITY)
Admission: EM | Admit: 2012-08-01 | Discharge: 2012-08-01 | Disposition: A | Discharge status: Home / Self Care | Payer: Medicare Other | Attending: Emergency Medicine | Admitting: Emergency Medicine

## 2012-08-01 DIAGNOSIS — Z8709 Personal history of other diseases of the respiratory system: Secondary | ICD-10-CM | POA: Insufficient documentation

## 2012-08-01 DIAGNOSIS — Z79899 Other long term (current) drug therapy: Secondary | ICD-10-CM | POA: Insufficient documentation

## 2012-08-01 DIAGNOSIS — R112 Nausea with vomiting, unspecified: Secondary | ICD-10-CM | POA: Insufficient documentation

## 2012-08-01 DIAGNOSIS — J45901 Unspecified asthma with (acute) exacerbation: Secondary | ICD-10-CM | POA: Insufficient documentation

## 2012-08-01 DIAGNOSIS — K219 Gastro-esophageal reflux disease without esophagitis: Secondary | ICD-10-CM | POA: Insufficient documentation

## 2012-08-01 DIAGNOSIS — I2510 Atherosclerotic heart disease of native coronary artery without angina pectoris: Secondary | ICD-10-CM | POA: Insufficient documentation

## 2012-08-01 DIAGNOSIS — Z862 Personal history of diseases of the blood and blood-forming organs and certain disorders involving the immune mechanism: Secondary | ICD-10-CM | POA: Insufficient documentation

## 2012-08-01 DIAGNOSIS — Z8719 Personal history of other diseases of the digestive system: Secondary | ICD-10-CM | POA: Insufficient documentation

## 2012-08-01 DIAGNOSIS — E785 Hyperlipidemia, unspecified: Secondary | ICD-10-CM | POA: Insufficient documentation

## 2012-08-01 DIAGNOSIS — Z9861 Coronary angioplasty status: Secondary | ICD-10-CM | POA: Insufficient documentation

## 2012-08-01 DIAGNOSIS — I10 Essential (primary) hypertension: Secondary | ICD-10-CM | POA: Insufficient documentation

## 2012-08-01 LAB — CBC WITH DIFFERENTIAL/PLATELET
Eosinophils Absolute: 0.1 10*3/uL (ref 0.0–0.7)
Eosinophils Relative: 2 % (ref 0–5)
Hemoglobin: 14.5 g/dL (ref 12.0–15.0)
Lymphocytes Relative: 39 % (ref 12–46)
Lymphs Abs: 2.6 10*3/uL (ref 0.7–4.0)
MCH: 29.4 pg (ref 26.0–34.0)
MCV: 84.2 fL (ref 78.0–100.0)
Monocytes Relative: 11 % (ref 3–12)
Neutrophils Relative %: 48 % (ref 43–77)
RBC: 4.94 MIL/uL (ref 3.87–5.11)

## 2012-08-01 LAB — COMPREHENSIVE METABOLIC PANEL
Alkaline Phosphatase: 65 U/L (ref 39–117)
BUN: 12 mg/dL (ref 6–23)
CO2: 25 mEq/L (ref 19–32)
GFR calc Af Amer: >90 mL/min (ref >90)
GFR calc non Af Amer: 82 mL/min — ABNORMAL LOW (ref >90)
Glucose, Bld: 133 mg/dL — ABNORMAL HIGH (ref 70–99)
Potassium: 3.7 mEq/L (ref 3.5–5.1)
Total Protein: 7.3 g/dL (ref 6.0–8.3)

## 2012-08-01 LAB — URINALYSIS, MICROSCOPIC ONLY
Bilirubin Urine: NEGATIVE
Ketones, ur: NEGATIVE mg/dL
Nitrite: NEGATIVE
Protein, ur: NEGATIVE mg/dL
Specific Gravity, Urine: 1.009 (ref 1.005–1.030)
Urobilinogen, UA: 0.2 mg/dL (ref 0.0–1.0)

## 2012-08-01 LAB — LIPASE, BLOOD: Lipase: 17 U/L (ref 11–59)

## 2012-08-01 MED ORDER — ACETAMINOPHEN 325 MG PO TABS
650.0000 mg | ORAL_TABLET | Freq: Once | ORAL | Status: AC
Start: 2012-08-01 — End: 2012-08-01
  Administered 2012-08-01: 650 mg via ORAL
  Filled 2012-08-01: qty 2

## 2012-08-01 MED ORDER — ONDANSETRON HCL 4 MG PO TABS: 4.0000 mg | ORAL_TABLET | Freq: Four times a day (QID) | ORAL | Status: DC

## 2012-08-01 MED ORDER — SODIUM CHLORIDE 0.9 % IV SOLN: INTRAVENOUS | Status: DC

## 2012-08-01 MED ORDER — PROMETHAZINE HCL 25 MG/ML IJ SOLN
25.0000 mg | Freq: Once | INTRAMUSCULAR | Status: AC
  Administered 2012-08-01: 25 mg via INTRAVENOUS
  Filled 2012-08-01: qty 1

## 2012-08-01 MED ORDER — ONDANSETRON HCL 4 MG/2ML IJ SOLN
4.0000 mg | Freq: Once | INTRAMUSCULAR | Status: AC
  Administered 2012-08-01: 4 mg via INTRAVENOUS
  Filled 2012-08-01: qty 2

## 2012-08-01 MED ORDER — PANTOPRAZOLE SODIUM 40 MG IV SOLR
40.0000 mg | Freq: Once | INTRAVENOUS | Status: AC
  Administered 2012-08-01: 40 mg via INTRAVENOUS
  Filled 2012-08-01: qty 40

## 2012-08-01 MED ORDER — SODIUM CHLORIDE 0.9 % IV SOLN
INTRAVENOUS | Status: DC
  Administered 2012-08-01: 01:00:00 via INTRAVENOUS

## 2012-08-01 MED ORDER — FENTANYL CITRATE 0.05 MG/ML IJ SOLN
25.0000 ug | INTRAMUSCULAR | Status: DC | PRN
  Administered 2012-08-01: 25 ug via INTRAVENOUS
  Filled 2012-08-01: qty 2

## 2012-08-01 NOTE — ED Notes (Signed)
Patient c/o intermittent nausea and vomiting x 2 days. Had take out for dinner Monday night (2 days ago) and within the hour began to have N/V. Also reports intermittent fevers, sweats or chills. No abdominal pain, diarrhea or constipation. Taking OTC anti-nausea medication with some temporary relief.

## 2012-08-01 NOTE — ED Provider Notes (Signed)
History     CSN: [id number]  Arrival date & time 08/01/12  0011   First MD Initiated Contact with Patient 08/01/12 [phone]      Chief Complaint  Patient presents with  . Nausea  . Vomiting    (Consider location/radiation/quality/duration/timing/severity/associated sxs/prior treatment) HPI History provided by the patient. Onset of nausea and vomiting Monday night - 48 hours ago. She has been trying over-the-counter anti-emetics without relief.  Tonight unable old and ginger ale presents here for evaluation. She is worried that she had bad potato salad. No bloody or bilious emesis. No recent antibiotics. No recent travel. No diarrhea. She denies any significant abdominal pain, does have some epigastric discomfort she attributes to vomiting.  No dysuria, urgency or frequency. Has noticed some darker urine today, no hematuria. No known sick contacts. Symptoms moderate in severity. Past Medical History  Diagnosis Date  . CAD (coronary artery disease)     LHC 2003 with 40% pLAD, 30% mLAD, 100% D1 (moderate vessel), 100%  D2 (small vessel), 95% mid small OM2.  Pt had PTCA to D1;  D2 and OM2 small vessels and tx medically;  Last Myoview (5/09):  EF 66%, anteroseptal/apical infarct with minimal peri-infarct ischemia.  No significant change from 11/06. Pt managed medically  . Recurrent aspiration bronchitis/pneumonia     PFTs 12/09 FVC 97%, FEV1 101%, ratio 77%, TLC 109%. No significant obstruction or restriction.   . Hypertension   . Hyperlipidemia   . Esophageal stricture   . Diverticulosis of colon   . Gastroesophageal reflux disease with hiatal hernia   . Iron deficiency anemia   . Gastritis   . GERD (gastroesophageal reflux disease)   . Hiatal hernia   . Asthma   . Blood transfusion   . Cough   . Wheezing   . Weakness   . Trouble swallowing   . Abdominal pain   . Hiatal hernia   . Shortness of breath     Past Surgical History  Procedure Laterality Date  . Back surgery    .  Cholecystectomy    . Bladder repair    . Hemorrhoid surgery    . Cholecystectomy      Patient unsure of date.  [name] Appendectomy      patient unsure of date  . Total abdominal hysterectomy  1975    partial  . Coronary angioplasty  11/16/2001  . Eye surgery  11/2000    bilateral cataracts with lens implant  . Hiatal hernia repair  06/03/2011    Procedure: LAPAROSCOPIC REPAIR OF HIATAL HERNIA;  Surgeon: Ernestene Mention, MD;  Location: [facility];  Service: General;  Laterality: N/A;  . Laparoscopic nissen fundoplication  06/03/2011    Procedure: LAPAROSCOPIC NISSEN FUNDOPLICATION;  Surgeon: Ernestene Mention, MD;  Location: [facility];  Service: General;  Laterality: N/A;  . Lumbar laminectomy/decompression microdiscectomy Right 06/26/2012    Procedure: LUMBAR LAMINECTOMY/DECOMPRESSION MICRODISCECTOMY;  Surgeon: Kerrin Champagne, MD;  Location: [facility] OR;  Service: Orthopedics;  Laterality: Right;  RIGHT L4-5 MICRODISCECTOMY    Family History  Problem Relation Age of Onset  . Cancer Mother     colon   . Colon cancer Mother 92  . Heart disease Father 86    heart attack    History  Substance Use Topics  . Smoking status: Never Smoker   . Smokeless tobacco: Never Used  . Alcohol Use: No    OB History   Grav Para Term Preterm Abortions TAB SAB Ect Mult Living  Review of Systems  Constitutional: Negative for fever and chills.  HENT: Negative for sore throat.   Eyes: Negative for visual disturbance.  Respiratory: Negative for shortness of breath.   Cardiovascular: Negative for chest pain.  Gastrointestinal: Negative for blood in stool and abdominal distention.  Genitourinary: Negative for dysuria, urgency and frequency.  Musculoskeletal: Negative for back pain.  Skin: Negative for rash.  Neurological: Negative for headaches.  All other systems reviewed and are negative.    Allergies  Aspartame and Daliresp  Home Medications   Current Outpatient Rx  Name  Route  Sig   Dispense  Refill  . ALPRAZolam (XANAX) 0.5 MG tablet   Oral   Take 0.5 mg by mouth at bedtime as needed. For sleep         . amLODipine (NORVASC) 10 MG tablet   Oral   Take 10 mg by mouth daily with breakfast.          . calcium carbonate (TUMS - DOSED IN MG ELEMENTAL CALCIUM) 500 MG chewable tablet   Oral   Chew 1 tablet by mouth daily as needed. For heartburn         . Cholecalciferol (VITAMIN D) 1000 UNITS capsule   Oral   Take 6,000 Units by mouth daily.          [name] CORAL CALCIUM PO   Oral   Take 1 tablet by mouth daily.         . Cyanocobalamin (VITAMIN B 12 PO)   Oral   Take 1 tablet by mouth daily.          [name] gabapentin (NEURONTIN) 300 MG capsule   Oral   Take 300 mg by mouth at bedtime.         . hydrochlorothiazide 25 MG tablet   Oral   Take 25 mg by mouth daily with breakfast.          . HYDROcodone-acetaminophen (NORCO/VICODIN) 5-325 MG per tablet   Oral   Take 1-2 tablets by mouth every 6 (six) hours as needed.   30 tablet   0   . HYDROcodone-acetaminophen (NORCO/VICODIN) 5-325 MG per tablet   Oral   Take 1-2 tablets by mouth every 4 (four) hours as needed.   60 tablet   1   . ipratropium-albuterol (DUONEB) 0.5-2.5 (3) MG/3ML SOLN   Nebulization   Take 3 mLs by nebulization 4 (four) times daily as needed. For shortness of breath   360 mL      . methocarbamol (ROBAXIN) 500 MG tablet   Oral   Take 1 tablet (500 mg total) by mouth every 6 (six) hours as needed.   60 tablet   2   . mometasone-formoterol (DULERA) 200-5 MCG/ACT AERO   Inhalation   Inhale 2 puffs into the lungs 2 (two) times daily.         . naproxen (NAPROSYN) 500 MG tablet   Oral   Take 1 tablet (500 mg total) by mouth 2 (two) times daily with a meal.   30 tablet   0   . naproxen (NAPROSYN) 500 MG tablet   Oral   Take 1 tablet (500 mg total) by mouth 2 (two) times daily with a meal.   60 tablet   2   . olmesartan (BENICAR) 20 MG tablet   Oral   Take 1  tablet (20 mg total) by mouth daily.         [name] omeprazole (PRILOSEC) 20 MG capsule  Oral   Take 20 mg by mouth daily.         . pravastatin (PRAVACHOL) 40 MG tablet   Oral   Take 40 mg by mouth at bedtime.          . senna (SENOKOT) 8.6 MG TABS   Oral   Take 1 tablet (8.6 mg total) by mouth 2 (two) times daily.   120 each   0     BP 152/74  Pulse 94  Temp(Src) 97.3 F (36.3 C) (Oral)  Resp 18  SpO2 99%  Physical Exam  Constitutional: She is oriented to person, place, and time. She appears well-developed and well-nourished.  HENT:  Head: Normocephalic and atraumatic.  Mouth/Throat: No oropharyngeal exudate.  Mildly dry mucous membranes  Eyes: EOM are normal. Pupils are equal, round, and reactive to light. No scleral icterus.  Neck: Neck supple.  Cardiovascular: Normal rate, regular rhythm and intact distal pulses.   Pulmonary/Chest: Effort normal and breath sounds normal. No respiratory distress.  Abdominal: Soft. Bowel sounds are normal. She exhibits no distension. There is no tenderness. There is no rebound.  Musculoskeletal: Normal range of motion. She exhibits no edema.  Neurological: She is alert and oriented to person, place, and time.  Skin: Skin is warm and dry.    ED Course  Procedures (including critical care time)  Results for orders placed during the hospital encounter of 08/01/12  CBC WITH DIFFERENTIAL      Result Value Range   WBC 6.7  4.0 - 10.5 K/uL   RBC 4.94  3.87 - 5.11 MIL/uL   Hemoglobin 14.5  12.0 - 15.0 g/dL   HCT 45.4  09.8 - 11.9 %   MCV 84.2  78.0 - 100.0 fL   MCH 29.4  26.0 - 34.0 pg   MCHC 34.9  30.0 - 36.0 g/dL   RDW 14.7  82.9 - 56.2 %   Platelets 356  150 - 400 K/uL   Neutrophils Relative 48  43 - 77 %   Neutro Abs 3.2  1.7 - 7.7 K/uL   Lymphocytes Relative 39  12 - 46 %   Lymphs Abs 2.6  0.7 - 4.0 K/uL   Monocytes Relative 11  3 - 12 %   Monocytes Absolute 0.8  0.1 - 1.0 K/uL   Eosinophils Relative 2  0 - 5 %    Eosinophils Absolute 0.1  0.0 - 0.7 K/uL   Basophils Relative 0  0 - 1 %   Basophils Absolute 0.0  0.0 - 0.1 K/uL  COMPREHENSIVE METABOLIC PANEL      Result Value Range   Sodium 137  135 - 145 mEq/L   Potassium 3.7  3.5 - 5.1 mEq/L   Chloride 100  96 - 112 mEq/L   CO2 25  19 - 32 mEq/L   Glucose, Bld 133 (*) 70 - 99 mg/dL   BUN 12  6 - 23 mg/dL   Creatinine, Ser 1.30  0.50 - 1.10 mg/dL   Calcium 86.5  8.4 - 78.4 mg/dL   Total Protein 7.3  6.0 - 8.3 g/dL   Albumin 4.2  3.5 - 5.2 g/dL   AST 20  0 - 37 U/L   ALT 16  0 - 35 U/L   Alkaline Phosphatase 65  39 - 117 U/L   Total Bilirubin 0.8  0.3 - 1.2 mg/dL   GFR calc non Af Amer 82 (*) >90 mL/min   GFR calc Af Amer >90  >90  mL/min  LIPASE, BLOOD      Result Value Range   Lipase 17  11 - 59 U/L    Date: 08/01/2012  Rate: 77  Rhythm: normal sinus rhythm  QRS Axis: normal  Intervals: normal  ST/T Wave abnormalities: nonspecific ST changes  Conduction Disutrbances:none  Narrative Interpretation: NSR low volt QRS  Old EKG Reviewed: unchanged  IVFs. IV zofran. IV Phenergan  4 AM patient still not feeling well and states she does not felt comfortable going home. Medicine consult obtained and Dr. Joneen Roach evaluated patient bedside. She has now changed her mind and would rather go home. She is provided with prescription for Zofran and recommended close primary care follow up. Vomiting and dehydration precautions verbalized as understood.   MDM   Nausea and vomiting for over 24 hours. Persistent nausea in the emergency department despite Zofran Phenergan and IV fluids  Evaluated with labs and urinalysis reviewed as above  Medicine consult  Vital signs and nursing notes reviewed and considered        Sunnie Nielsen, MD 08/01/12 [phone]

## 2012-08-01 NOTE — H&P (Signed)
PCP:   [name], MD   Chief Complaint:  Nausea and vomiting  HPI: This is a 77 year old female who states that since Monday she developed nausea and vomiting. It has been continuous, she states today she vomited at least 12 times. She has abdominal pain that is related to retching. She denies any diarrhea, fevers, burning urination. She states she does have some chills and she had some lightheadedness today. She denies any shortness of breath, cough, wheeze,  Myalgia or altered mentation. She did say she was presyncopal once with her dizziness. She came to the ER. In the ER she received antiemetics and IV fluid hydration. She has not vomited since she's been in the ER. The hospitalist was called and requested to evaluate the patient for admission. History provided the patient and her husband was at her bedside. Patient denies any sick contacts. patient denies any difficulty swallowing solids or liquids, she does have a history of esophageal strictures.  Review of Systems:  The patient denies anorexia, fever, weight loss,, vision loss, decreased hearing, hoarseness, chest pain, syncope, dyspnea on exertion, peripheral edema, balance deficits, hemoptysis, abdominal pain, melena, hematochezia, severe indigestion/heartburn, hematuria, incontinence, genital sores, muscle weakness, suspicious skin lesions, transient blindness, difficulty walking, depression, unusual weight change, abnormal bleeding, enlarged lymph nodes, angioedema, and breast masses.  Past Medical History: Past Medical History  Diagnosis Date  . CAD (coronary artery disease)     LHC 2003 with 40% pLAD, 30% mLAD, 100% D1 (moderate vessel), 100%  D2 (small vessel), 95% mid small OM2.  Pt had PTCA to D1;  D2 and OM2 small vessels and tx medically;  Last Myoview (5/09):  EF 66%, anteroseptal/apical infarct with minimal peri-infarct ischemia.  No significant change from 11/06. Pt managed medically  . Recurrent aspiration  bronchitis/pneumonia     PFTs 12/09 FVC 97%, FEV1 101%, ratio 77%, TLC 109%. No significant obstruction or restriction.   . Hypertension   . Hyperlipidemia   . Esophageal stricture   . Diverticulosis of colon   . Gastroesophageal reflux disease with hiatal hernia   . Iron deficiency anemia   . Gastritis   . GERD (gastroesophageal reflux disease)   . Hiatal hernia   . Asthma   . Blood transfusion   . Cough   . Wheezing   . Weakness   . Trouble swallowing   . Abdominal pain   . Hiatal hernia   . Shortness of breath    Past Surgical History  Procedure Laterality Date  . Back surgery    . Cholecystectomy    . Bladder repair    . Hemorrhoid surgery    . Cholecystectomy      Patient unsure of date.  [name] Appendectomy      patient unsure of date  . Total abdominal hysterectomy  1975    partial  . Coronary angioplasty  11/16/2001  . Eye surgery  11/2000    bilateral cataracts with lens implant  . Hiatal hernia repair  06/03/2011    Procedure: LAPAROSCOPIC REPAIR OF HIATAL HERNIA;  Surgeon: Ernestene Mention, MD;  Location: [facility];  Service: General;  Laterality: N/A;  . Laparoscopic nissen fundoplication  06/03/2011    Procedure: LAPAROSCOPIC NISSEN FUNDOPLICATION;  Surgeon: Ernestene Mention, MD;  Location: [facility];  Service: General;  Laterality: N/A;  . Lumbar laminectomy/decompression microdiscectomy Right 06/26/2012    Procedure: LUMBAR LAMINECTOMY/DECOMPRESSION MICRODISCECTOMY;  Surgeon: Kerrin Champagne, MD;  Location: [facility] OR;  Service: Orthopedics;  Laterality: Right;  RIGHT L4-5 MICRODISCECTOMY    Medications: Prior to Admission medications   Medication Sig Start Date End Date Taking? Authorizing Provider  ALPRAZolam Prudy Feeler) 0.5 MG tablet Take 0.5 mg by mouth at bedtime as needed. For sleep    Historical Provider, MD  amLODipine (NORVASC) 10 MG tablet Take 10 mg by mouth daily with breakfast.     Historical Provider, MD  calcium carbonate (TUMS - DOSED IN MG ELEMENTAL CALCIUM)  500 MG chewable tablet Chew 1 tablet by mouth daily as needed. For heartburn    Historical Provider, MD  Cholecalciferol (VITAMIN D) 1000 UNITS capsule Take 6,000 Units by mouth daily.     Historical Provider, MD  CORAL CALCIUM PO Take 1 tablet by mouth daily.    Historical Provider, MD  Cyanocobalamin (VITAMIN B 12 PO) Take 1 tablet by mouth daily.     Historical Provider, MD  gabapentin (NEURONTIN) 300 MG capsule Take 300 mg by mouth at bedtime.    Historical Provider, MD  hydrochlorothiazide 25 MG tablet Take 25 mg by mouth daily with breakfast.  09/11/10   Historical Provider, MD  HYDROcodone-acetaminophen (NORCO/VICODIN) 5-325 MG per tablet Take 1-2 tablets by mouth every 6 (six) hours as needed. 06/22/12   Inez Catalina, MD  HYDROcodone-acetaminophen (NORCO/VICODIN) 5-325 MG per tablet Take 1-2 tablets by mouth every 4 (four) hours as needed. 06/28/12   Kerrin Champagne, MD  ipratropium-albuterol (DUONEB) 0.5-2.5 (3) MG/3ML SOLN Take 3 mLs by nebulization 4 (four) times daily as needed. For shortness of breath 10/12/10   Beatrice Lecher, PA-C  methocarbamol (ROBAXIN) 500 MG tablet Take 1 tablet (500 mg total) by mouth every 6 (six) hours as needed. 06/28/12   Kerrin Champagne, MD  mometasone-formoterol [facility]) 200-5 MCG/ACT AERO Inhale 2 puffs into the lungs 2 (two) times daily. 01/25/12   Nyoka Cowden, MD  naproxen (NAPROSYN) 500 MG tablet Take 1 tablet (500 mg total) by mouth 2 (two) times daily with a meal. 06/13/12   Dahlia Client Muthersbaugh, PA-C  naproxen (NAPROSYN) 500 MG tablet Take 1 tablet (500 mg total) by mouth 2 (two) times daily with a meal. 06/28/12   Kerrin Champagne, MD  olmesartan (BENICAR) 20 MG tablet Take 1 tablet (20 mg total) by mouth daily. 01/25/12   Nyoka Cowden, MD  omeprazole (PRILOSEC) 20 MG capsule Take 20 mg by mouth daily.    Historical Provider, MD  pravastatin (PRAVACHOL) 40 MG tablet Take 40 mg by mouth at bedtime.     Historical Provider, MD  senna (SENOKOT) 8.6 MG TABS Take 1  tablet (8.6 mg total) by mouth 2 (two) times daily. 06/28/12   Kerrin Champagne, MD    Allergies:   Allergies  Allergen Reactions  . Aspartame Diarrhea and Nausea And Vomiting  . Daliresp (Roflumilast) Nausea And Vomiting    Social History:  reports that she has never smoked. She has never used smokeless tobacco. She reports that she does not drink alcohol or use illicit drugs.  Family History: Family History  Problem Relation Age of Onset  . Cancer Mother     colon   . Colon cancer Mother 62  . Heart disease Father 6    heart attack    Physical Exam: Filed Vitals:   08/01/12 0200 08/01/12 0215 08/01/12 0230 08/01/12 0330  BP: 149/78 137/63 137/76 142/52  Pulse: 77 77 86 104  Temp:      TempSrc:      Resp: 18 11 13  17  SpO2: 99% 99% 98% 98%    General:  Alert and oriented times three, well developed and nourished, no acute distress Eyes: PERRLA, pink conjunctiva, no scleral icterus ENT: Dry oral mucosa, neck supple, no thyromegaly Lungs: clear to ascultation, no wheeze, no crackles, no use of accessory muscles Cardiovascular: regular rate and rhythm, no regurgitation, no gallops, no murmurs. No carotid bruits, no JVD Abdomen: soft, positive BS, non-tender, non-distended, no organomegaly, not an acute abdomen GU: not examined Neuro: CN II - XII grossly intact, sensation intact Musculoskeletal: strength 5/5 all extremities, no clubbing, cyanosis or edema Skin: no rash, no subcutaneous crepitation, no decubitus Psych: appropriate patient   Labs on Admission:   Recent Labs  08/01/12 0020  NA 137  K 3.7  CL 100  CO2 25  GLUCOSE 133*  BUN 12  CREATININE 0.74  CALCIUM 10.0    Recent Labs  08/01/12 0020  AST 20  ALT 16  ALKPHOS 65  BILITOT 0.8  PROT 7.3  ALBUMIN 4.2    Recent Labs  08/01/12 0020  LIPASE 17    Recent Labs  08/01/12 0020  WBC 6.7  NEUTROABS 3.2  HGB 14.5  HCT 41.6  MCV 84.2  PLT 356     Micro Results: Results for [name] [name], [name] [name] (MRN [id number]) as of 08/01/2012 04:33  Ref. Range 08/01/2012 03:12  Color, Urine Latest Range: YELLOW  YELLOW  APPearance Latest Range: CLEAR  CLEAR  Specific Gravity, Urine Latest Range: 1.005-1.030  1.009  pH Latest Range: 5.0-8.0  7.0  Glucose Latest Range: NEGATIVE mg/dL NEGATIVE  Bilirubin Urine Latest Range: NEGATIVE  NEGATIVE  Ketones, ur Latest Range: NEGATIVE mg/dL NEGATIVE  Protein Latest Range: NEGATIVE mg/dL NEGATIVE  Urobilinogen, UA Latest Range: 0.0-1.0 mg/dL 0.2  Nitrite Latest Range: NEGATIVE  NEGATIVE  Leukocytes, UA Latest Range: NEGATIVE  NEGATIVE  Hgb urine dipstick Latest Range: NEGATIVE  TRACE (A)  RBC / HPF Latest Range: <3 RBC/hpf 0-2  Squamous Epithelial / LPF Latest Range: RARE  RARE  Bacteria, UA Latest Range: RARE  RARE     Radiological Exams on Admission: No results found.  Assessment/Plan Present on Admission:  Gastroenteritis Resolved with antiemetics an IV fluid hydration. patient feels better and wants to go home. Would recommend discharging with antiemetics. Coronary artery disease Hypertension Dyslipidemia  Esophageal stricture GERD Diverticulosis Stable  Full code  [name] [name] 08/01/2012, 4:18 AM

## 2012-08-07 ENCOUNTER — Encounter: Payer: Self-pay | Admitting: Cardiology

## 2012-10-24 ENCOUNTER — Encounter (HOSPITAL_COMMUNITY): Payer: Self-pay | Admitting: *Deleted

## 2012-10-24 ENCOUNTER — Emergency Department (HOSPITAL_COMMUNITY)
Admission: EM | Admit: 2012-10-24 | Discharge: 2012-10-25 | Disposition: A | Discharge status: Home / Self Care | Payer: Medicare Other | Attending: Emergency Medicine | Admitting: Emergency Medicine

## 2012-10-24 DIAGNOSIS — M7061 Trochanteric bursitis, right hip: Secondary | ICD-10-CM

## 2012-10-24 DIAGNOSIS — M5430 Sciatica, unspecified side: Secondary | ICD-10-CM | POA: Insufficient documentation

## 2012-10-24 DIAGNOSIS — J45909 Unspecified asthma, uncomplicated: Secondary | ICD-10-CM | POA: Insufficient documentation

## 2012-10-24 DIAGNOSIS — Z862 Personal history of diseases of the blood and blood-forming organs and certain disorders involving the immune mechanism: Secondary | ICD-10-CM | POA: Insufficient documentation

## 2012-10-24 DIAGNOSIS — M5431 Sciatica, right side: Secondary | ICD-10-CM

## 2012-10-24 DIAGNOSIS — I10 Essential (primary) hypertension: Secondary | ICD-10-CM | POA: Insufficient documentation

## 2012-10-24 DIAGNOSIS — M76899 Other specified enthesopathies of unspecified lower limb, excluding foot: Secondary | ICD-10-CM | POA: Insufficient documentation

## 2012-10-24 DIAGNOSIS — Z8709 Personal history of other diseases of the respiratory system: Secondary | ICD-10-CM | POA: Insufficient documentation

## 2012-10-24 DIAGNOSIS — K219 Gastro-esophageal reflux disease without esophagitis: Secondary | ICD-10-CM | POA: Insufficient documentation

## 2012-10-24 DIAGNOSIS — R269 Unspecified abnormalities of gait and mobility: Secondary | ICD-10-CM | POA: Insufficient documentation

## 2012-10-24 DIAGNOSIS — Z79899 Other long term (current) drug therapy: Secondary | ICD-10-CM | POA: Insufficient documentation

## 2012-10-24 DIAGNOSIS — E785 Hyperlipidemia, unspecified: Secondary | ICD-10-CM | POA: Insufficient documentation

## 2012-10-24 DIAGNOSIS — Z8719 Personal history of other diseases of the digestive system: Secondary | ICD-10-CM | POA: Insufficient documentation

## 2012-10-24 DIAGNOSIS — I2510 Atherosclerotic heart disease of native coronary artery without angina pectoris: Secondary | ICD-10-CM | POA: Insufficient documentation

## 2012-10-24 NOTE — ED Notes (Signed)
Rt hip pain since last Friday and she stooped down in her flower bed and has had pain since then

## 2012-10-25 ENCOUNTER — Emergency Department (HOSPITAL_COMMUNITY): Payer: Medicare Other

## 2012-10-25 MED ORDER — IBUPROFEN 400 MG PO TABS: 400.0000 mg | ORAL_TABLET | Freq: Three times a day (TID) | ORAL | Status: DC

## 2012-10-25 MED ORDER — HYDROCODONE-ACETAMINOPHEN 5-325 MG PO TABS: 1.0000 | ORAL_TABLET | ORAL | Status: DC | PRN

## 2012-10-25 MED ORDER — HYDROCODONE-ACETAMINOPHEN 5-325 MG PO TABS
2.0000 | ORAL_TABLET | Freq: Once | ORAL | Status: AC
  Administered 2012-10-25: 2 via ORAL
  Filled 2012-10-25: qty 2

## 2012-10-25 NOTE — ED Provider Notes (Signed)
Medical screening examination/treatment/procedure(s) were conducted as a shared visit with non-physician practitioner(s) and myself.  I personally evaluated the patient during the encounter [name] [name], M.D.  [name] [name] is a 77 y.o. female history of back surgery, laminectomy and microdiscectomy presents with low back pain with radicular symptoms down to the right knee. No neurologic deficits. No saddle anesthesia, no urinary retention or urinary or fecal incontinence. Pain is moderate to severe, and uncontrolled with over-the-counter medications. Also complaining about right hip pain  VITAL SIGNS:   Filed Vitals:   10/24/12 2331  BP: 144/56  Pulse: 71  Temp: 97.7 F (36.5 C)  Resp: 20   CONSTITUTIONAL: Awake, oriented, appears non-toxic HENT: Atraumatic, normocephalic, oral mucosa pink and moist, airway patent. Nares patent without drainage. External ears normal. EYES: Conjunctiva clear, EOMI, PERRLA NECK: Trachea midline, non-tender, supple CARDIOVASCULAR: Normal heart rate, Normal rhythm, No murmurs, rubs, gallops PULMONARY/CHEST: Clear to auscultation, no rhonchi, wheezes, or rales. Symmetrical breath sounds. Non-tender. ABDOMINAL: Non-distended, soft, non-tender - no rebound or guarding.  BS normal. BACK: No step-offs or deformities, nontender to palpation in the midline, no skin abnormalities. Negative straight leg test NEUROLOGIC: Non-focal, moving all four extremities, no gross sensory or motor deficits. 2+ patellar reflexes. No clonus. EXTREMITIES: No clubbing, cyanosis, or edema. Pain over right greater trochanter SKIN: Warm, Dry, No erythema, No rash  JYN:WGNFAOZ Call is a 76 y.o. female presenting with back pain. She says she's been working in the garden frequently over the last couple of days and likely exacerbated her low back pain. Patient has followup with her neurosurgeon next week Tuesday. Patient has been altering her gait secondary to her back pain has been using a  cane with the left hand which she typically uses. This is likely caused her mechanics to the offset and she is caused to self some mild trochanteric bursitis on the right hip.  I do not think this patient has a medical or surgical emergency at this time, she has no evidence for cord compression or cauda equina syndrome. We'll treat the patient's pain symptomatically, I urged her to use a walker to get about and to use her legs equally bilaterally, to use her cane for transfers and in small spaces.  Patient will followup with her neurosurgeon next week or return to the ER if anything worsens.    [name] Skene, MD 10/25/12 [phone]

## 2012-10-25 NOTE — ED Notes (Signed)
MD Bonk at bedside.

## 2012-10-25 NOTE — ED Provider Notes (Signed)
History     CSN: [id number]  Arrival date & time 10/24/12  2321   First MD Initiated Contact with Patient 10/24/12 2343      Chief Complaint  Patient presents with  . Hip Pain    (Consider location/radiation/quality/duration/timing/severity/associated sxs/prior treatment) Patient is a 77 y.o. female presenting with hip pain. The history is provided by the patient and medical records. No language interpreter was used.  Hip Pain This is a new problem. The current episode started in the past 7 days. The problem occurs constantly. The problem has been unchanged. Associated symptoms include arthralgias. Pertinent negatives include no abdominal pain, anorexia, change in bowel habit, chest pain, chills, congestion, coughing, diaphoresis, fatigue, fever, headaches, joint swelling, nausea, neck pain, numbness, rash, sore throat, swollen glands, urinary symptoms, vertigo, visual change, vomiting or weakness. The symptoms are aggravated by bending. She has tried heat and rest for the symptoms. The treatment provided mild relief.   Paddy Walthall is a 77 y.o. female  with a hx of CAD, HTN, GERD, asthma, SOB presents to the Emergency Department complaining of gradual, persistent, progressively worsening right hip pain 5 days ago after planting flowers in her yard.  Pt states she fell in Feb and required surgery for a broken L4, L5.  Pt denies fall, or known injury to her hip.  She also denies back pain.  Pain is located in her right buttock and movement causes it to radiate down her right leg.  Movement and palpation make the pain worse and nothing makes it better.  She has an appointment with Dr Otelia Sergeant on June 23rd for follow-up but was unable to bear the pain, prompting her visit to the ER.    Pt denies fever, chills, headache, neck pain, SOB, chest pain, saddle anesthesia, loss of bowel or bladder function, numbness or tingling in her legs, abdominal pain, N/V/D, weakness, dizziness, syncope.    Past Medical  History  Diagnosis Date  . CAD (coronary artery disease)     LHC 2003 with 40% pLAD, 30% mLAD, 100% D1 (moderate vessel), 100%  D2 (small vessel), 95% mid small OM2.  Pt had PTCA to D1;  D2 and OM2 small vessels and tx medically;  Last Myoview (5/09):  EF 66%, anteroseptal/apical infarct with minimal peri-infarct ischemia.  No significant change from 11/06. Pt managed medically  . Recurrent aspiration bronchitis/pneumonia     PFTs 12/09 FVC 97%, FEV1 101%, ratio 77%, TLC 109%. No significant obstruction or restriction.   . Hypertension   . Hyperlipidemia   . Esophageal stricture   . Diverticulosis of colon   . Gastroesophageal reflux disease with hiatal hernia   . Iron deficiency anemia   . Gastritis   . GERD (gastroesophageal reflux disease)   . Hiatal hernia   . Asthma   . Blood transfusion   . Cough   . Wheezing   . Weakness   . Trouble swallowing   . Abdominal pain   . Hiatal hernia   . Shortness of breath     Past Surgical History  Procedure Laterality Date  . Back surgery    . Cholecystectomy    . Bladder repair    . Hemorrhoid surgery    . Cholecystectomy      Patient unsure of date.  [name] Appendectomy      patient unsure of date  . Total abdominal hysterectomy  1975    partial  . Coronary angioplasty  11/16/2001  . Eye surgery  11/2000  bilateral cataracts with lens implant  . Hiatal hernia repair  06/03/2011    Procedure: LAPAROSCOPIC REPAIR OF HIATAL HERNIA;  Surgeon: Ernestene Mention, MD;  Location: [facility];  Service: General;  Laterality: N/A;  . Laparoscopic nissen fundoplication  06/03/2011    Procedure: LAPAROSCOPIC NISSEN FUNDOPLICATION;  Surgeon: Ernestene Mention, MD;  Location: [facility];  Service: General;  Laterality: N/A;  . Lumbar laminectomy/decompression microdiscectomy Right 06/26/2012    Procedure: LUMBAR LAMINECTOMY/DECOMPRESSION MICRODISCECTOMY;  Surgeon: Kerrin Champagne, MD;  Location: [facility] OR;  Service: Orthopedics;  Laterality: Right;  RIGHT L4-5  MICRODISCECTOMY    Family History  Problem Relation Age of Onset  . Cancer Mother     colon   . Colon cancer Mother 4  . Heart disease Father 3    heart attack    History  Substance Use Topics  . Smoking status: Never Smoker   . Smokeless tobacco: Never Used  . Alcohol Use: No    OB History   Grav Para Term Preterm Abortions TAB SAB Ect Mult Living                  Review of Systems  Constitutional: Negative for fever, chills, diaphoresis and fatigue.  HENT: Negative for congestion, sore throat, neck pain and neck stiffness.   Respiratory: Negative for cough, chest tightness and shortness of breath.   Cardiovascular: Negative for chest pain.  Gastrointestinal: Negative for nausea, vomiting, abdominal pain, diarrhea, anorexia and change in bowel habit.  Genitourinary: Negative for dysuria, urgency, frequency and hematuria.  Musculoskeletal: Positive for arthralgias and gait problem ( 2/2 pain). Negative for joint swelling.  Skin: Negative for rash.  Neurological: Negative for vertigo, weakness, light-headedness, numbness and headaches.  All other systems reviewed and are negative.    Allergies  Aspartame and Daliresp  Home Medications   Current Outpatient Rx  Name  Route  Sig  Dispense  Refill  . ALPRAZolam (XANAX) 0.5 MG tablet   Oral   Take 0.5 mg by mouth at bedtime as needed. For sleep         . amLODipine (NORVASC) 10 MG tablet   Oral   Take 10 mg by mouth daily with breakfast.          . calcium carbonate (TUMS - DOSED IN MG ELEMENTAL CALCIUM) 500 MG chewable tablet   Oral   Chew 1 tablet by mouth 3 (three) times daily as needed for heartburn. For heartburn         . Cholecalciferol (VITAMIN D) 1000 UNITS capsule   Oral   Take 6,000 Units by mouth daily.          [name] CORAL CALCIUM PO   Oral   Take 1 tablet by mouth daily. 1000mg          . Cyanocobalamin (VITAMIN B 12 PO)   Oral   Take 1 tablet by mouth daily.          [name] gabapentin  (NEURONTIN) 300 MG capsule   Oral   Take 300 mg by mouth at bedtime.         . hydrochlorothiazide 25 MG tablet   Oral   Take 25 mg by mouth daily with breakfast.          . ipratropium-albuterol (DUONEB) 0.5-2.5 (3) MG/3ML SOLN   Nebulization   Take 3 mLs by nebulization 4 (four) times daily as needed. For shortness of breath   360 mL      .  mometasone-formoterol (DULERA) 200-5 MCG/ACT AERO   Inhalation   Inhale 2 puffs into the lungs 2 (two) times daily.         . Multiple Vitamin (MULTIVITAMIN WITH MINERALS) TABS   Oral   Take 1 tablet by mouth daily.         [name] olmesartan (BENICAR) 20 MG tablet   Oral   Take 1 tablet (20 mg total) by mouth daily.         [name] omeprazole (PRILOSEC) 20 MG capsule   Oral   Take 20 mg by mouth daily.         . pravastatin (PRAVACHOL) 40 MG tablet   Oral   Take 40 mg by mouth at bedtime.          [name] HYDROcodone-acetaminophen (NORCO/VICODIN) 5-325 MG per tablet   Oral   Take 1 tablet by mouth every 4 (four) hours as needed for pain.   21 tablet   0   . ibuprofen (ADVIL,MOTRIN) 400 MG tablet   Oral   Take 1 tablet (400 mg total) by mouth 3 (three) times daily. For 5 days - with food   30 tablet   0     BP 144/56  Pulse 71  Temp(Src) 97.7 F (36.5 C)  Resp 20  SpO2 98%  Physical Exam  Nursing note and vitals reviewed. Constitutional: She is oriented to person, place, and time. She appears well-developed and well-nourished. No distress.  HENT:  Head: Normocephalic and atraumatic.  Mouth/Throat: Oropharynx is clear and moist. No oropharyngeal exudate.  Eyes: Conjunctivae and EOM are normal. Pupils are equal, round, and reactive to light.  Neck: Normal range of motion. Neck supple.  Full ROM without pain  Cardiovascular: Normal rate, regular rhythm, S1 normal, S2 normal, normal heart sounds and intact distal pulses.   No murmur heard. Pulses:      Radial pulses are 2+ on the right side, and 2+ on the left side.        Dorsalis pedis pulses are 2+ on the right side, and 2+ on the left side.       Posterior tibial pulses are 2+ on the right side, and 2+ on the left side.  Capillary refill < 3 sec  Pulmonary/Chest: Effort normal and breath sounds normal. No respiratory distress. She has no wheezes.  Abdominal: Soft. Bowel sounds are normal. She exhibits no distension. There is no tenderness.  Musculoskeletal: Normal range of motion.       Right hip: She exhibits tenderness. She exhibits normal range of motion, normal strength, no swelling, no crepitus, no deformity and no laceration.  Full range of motion of the T-spine and L-spine No tenderness to palpation of the spinous processes of the T-spine or L-spine No tenderness to palpation of the paraspinous muscles of the L-spine Pain to palpation of the right trochanteric bursa; no pain to palpation of the groin area  Lymphadenopathy:    She has no cervical adenopathy.  Neurological: She is alert and oriented to person, place, and time. She has normal reflexes. She exhibits normal muscle tone. Coordination normal.  Speech is clear and goal oriented, follows commands Normal strength in upper and lower extremities bilaterally including dorsiflexion and plantar flexion, strong and equal grip strength Sensation normal to light and sharp touch Moves extremities without ataxia, coordination intact Altered gait 2/2 pain in hip Normal balance  Skin: Skin is warm and dry. No rash noted. She is not diaphoretic. No erythema.  Psychiatric: She  has a normal mood and affect. Her behavior is normal.    ED Course  Procedures (including critical care time)  Labs Reviewed - No data to display Dg Hip Complete Right  10/25/2012   *RADIOLOGY REPORT*  Clinical Data: Right-sided hip pain.  RIGHT HIP - COMPLETE 2+ VIEW  Comparison: No priors.  Findings: AP view of the pelvis and AP and lateral views of the right hip demonstrate no acute displaced fracture of the bony pelvic  ring.  Bilateral proximal femurs as visualized appear intact, the right femoral head is properly located.  Joint space narrowing, subchondral sclerosis, subchondral cyst formation and osteophyte formation are noted in the hip joints bilaterally, compatible with moderate osteoarthritis.  IMPRESSION: 1.  No acute radiographic abnormality of the bony pelvis or the right hip. 2.  Moderate bilateral hip joint osteoarthritis, as above.   Original Report Authenticated By: Trudie Reed, M.D.     1. Trochanteric bursitis, right   2. Sciatica, right       MDM  Lara Mulch presents with Hx and PE consistent with trochanteric bursitis and sciatica.  Pt without back pain.  No neurological deficits and normal neuro exam.  Patient can walk but states is painful.  No loss of bowel or bladder control.  No concern for cauda equina.  No fever, night sweats, weight loss, h/o cancer, IVDU.  RICE protocol and pain medicine indicated and discussed with patient.  Will d/c home with ibuprofen 400mg  TID x 5 days and have her continue her Norco for pain control.  Pt is to follow-up with [facility] for further evaluation and treatment options.    Dr. Rulon Abide was consulted, evaluated this patient with me and agrees with the plan.         Dahlia Client Lonnell Chaput, PA-C 10/25/12 [phone]

## 2012-10-26 ENCOUNTER — Telehealth (HOSPITAL_COMMUNITY): Payer: Self-pay | Admitting: Emergency Medicine

## 2012-11-01 ENCOUNTER — Other Ambulatory Visit: Payer: Medicare Other

## 2012-11-01 ENCOUNTER — Other Ambulatory Visit: Payer: Self-pay | Admitting: Physician Assistant

## 2012-11-01 DIAGNOSIS — M549 Dorsalgia, unspecified: Secondary | ICD-10-CM

## 2012-12-09 ENCOUNTER — Emergency Department (HOSPITAL_COMMUNITY)
Admission: EM | Admit: 2012-12-09 | Discharge: 2012-12-10 | Disposition: A | Discharge status: Home / Self Care | Payer: Medicare Other | Attending: Emergency Medicine | Admitting: Emergency Medicine

## 2012-12-09 ENCOUNTER — Encounter (HOSPITAL_COMMUNITY): Payer: Self-pay

## 2012-12-09 DIAGNOSIS — Z888 Allergy status to other drugs, medicaments and biological substances status: Secondary | ICD-10-CM | POA: Insufficient documentation

## 2012-12-09 DIAGNOSIS — Z79899 Other long term (current) drug therapy: Secondary | ICD-10-CM | POA: Insufficient documentation

## 2012-12-09 DIAGNOSIS — D509 Iron deficiency anemia, unspecified: Secondary | ICD-10-CM | POA: Insufficient documentation

## 2012-12-09 DIAGNOSIS — I10 Essential (primary) hypertension: Secondary | ICD-10-CM | POA: Insufficient documentation

## 2012-12-09 DIAGNOSIS — I2510 Atherosclerotic heart disease of native coronary artery without angina pectoris: Secondary | ICD-10-CM | POA: Insufficient documentation

## 2012-12-09 DIAGNOSIS — M545 Low back pain, unspecified: Secondary | ICD-10-CM | POA: Insufficient documentation

## 2012-12-09 DIAGNOSIS — G8929 Other chronic pain: Secondary | ICD-10-CM | POA: Insufficient documentation

## 2012-12-09 DIAGNOSIS — E785 Hyperlipidemia, unspecified: Secondary | ICD-10-CM | POA: Insufficient documentation

## 2012-12-09 DIAGNOSIS — K219 Gastro-esophageal reflux disease without esophagitis: Secondary | ICD-10-CM | POA: Insufficient documentation

## 2012-12-09 DIAGNOSIS — Z5189 Encounter for other specified aftercare: Secondary | ICD-10-CM | POA: Insufficient documentation

## 2012-12-09 DIAGNOSIS — Z8719 Personal history of other diseases of the digestive system: Secondary | ICD-10-CM | POA: Insufficient documentation

## 2012-12-09 DIAGNOSIS — J45909 Unspecified asthma, uncomplicated: Secondary | ICD-10-CM | POA: Insufficient documentation

## 2012-12-09 DIAGNOSIS — Z8701 Personal history of pneumonia (recurrent): Secondary | ICD-10-CM | POA: Insufficient documentation

## 2012-12-09 MED ORDER — DEXTROSE 5 % IV SOLN
1000.0000 mg | Freq: Once | INTRAVENOUS | Status: AC
  Administered 2012-12-10: 1000 mg via INTRAVENOUS
  Filled 2012-12-09: qty 10

## 2012-12-09 MED ORDER — MORPHINE SULFATE 4 MG/ML IJ SOLN
4.0000 mg | Freq: Once | INTRAMUSCULAR | Status: AC
  Administered 2012-12-09: 4 mg via INTRAVENOUS
  Filled 2012-12-09: qty 1

## 2012-12-09 NOTE — ED Notes (Signed)
C/o R hip pain, radiates down to knee, onset 3 weeks ago, gradually progressively worse today, took robaxin at 1630, (Denies: nvd, fever, loss of control of bowel or bladder, urinary sx, sob dizziness or other sx).

## 2012-12-09 NOTE — ED Provider Notes (Signed)
CSN: [id number]     Arrival date & time 12/09/12  2138 History     First MD Initiated Contact with Patient 12/09/12 2201     Chief Complaint  Patient presents with  . Back Pain   (Consider location/radiation/quality/duration/timing/severity/associated sxs/prior Treatment) HPI Comments: Patient with chronic low back pain, ongoing for many years hx 3 prior surgeries.  This current pain has been ongoing since February.  The pain has been gradually worsening.  States she laid down this afternoon and was unable to get up without assistance due to pain.  Denies any recent injury.  Denies radiation of pain, weakness or numbness of the extremities, or bowel or bladder retention or incontinence. Denies fevers, chills, body aches, abdominal pain, N/V/D, urinary symptoms.  Orthopedist currently taking care of her for this is Dr Otelia Sergeant. Denies any injury or fall.    Patient is a 77 y.o. female presenting with back pain. The history is provided by the patient and the spouse.  Back Pain Associated symptoms: no abdominal pain, no dysuria, no fever, no numbness and no weakness     Past Medical History  Diagnosis Date  . CAD (coronary artery disease)     LHC 2003 with 40% pLAD, 30% mLAD, 100% D1 (moderate vessel), 100%  D2 (small vessel), 95% mid small OM2.  Pt had PTCA to D1;  D2 and OM2 small vessels and tx medically;  Last Myoview (5/09):  EF 66%, anteroseptal/apical infarct with minimal peri-infarct ischemia.  No significant change from 11/06. Pt managed medically  . Recurrent aspiration bronchitis/pneumonia     PFTs 12/09 FVC 97%, FEV1 101%, ratio 77%, TLC 109%. No significant obstruction or restriction.   . Hypertension   . Hyperlipidemia   . Esophageal stricture   . Diverticulosis of colon   . Gastroesophageal reflux disease with hiatal hernia   . Iron deficiency anemia   . Gastritis   . GERD (gastroesophageal reflux disease)   . Hiatal hernia   . Asthma   . Blood transfusion   . Cough   .  Wheezing   . Weakness   . Trouble swallowing   . Abdominal pain   . Hiatal hernia   . Shortness of breath    Past Surgical History  Procedure Laterality Date  . Back surgery    . Cholecystectomy    . Bladder repair    . Hemorrhoid surgery    . Cholecystectomy      Patient unsure of date.  [name] Appendectomy      patient unsure of date  . Total abdominal hysterectomy  1975    partial  . Coronary angioplasty  11/16/2001  . Eye surgery  11/2000    bilateral cataracts with lens implant  . Hiatal hernia repair  06/03/2011    Procedure: LAPAROSCOPIC REPAIR OF HIATAL HERNIA;  Surgeon: Ernestene Mention, MD;  Location: [facility];  Service: General;  Laterality: N/A;  . Laparoscopic nissen fundoplication  06/03/2011    Procedure: LAPAROSCOPIC NISSEN FUNDOPLICATION;  Surgeon: Ernestene Mention, MD;  Location: [facility];  Service: General;  Laterality: N/A;  . Lumbar laminectomy/decompression microdiscectomy Right 06/26/2012    Procedure: LUMBAR LAMINECTOMY/DECOMPRESSION MICRODISCECTOMY;  Surgeon: Kerrin Champagne, MD;  Location: [facility] OR;  Service: Orthopedics;  Laterality: Right;  RIGHT L4-5 MICRODISCECTOMY   Family History  Problem Relation Age of Onset  . Cancer Mother     colon   . Colon cancer Mother 79  . Heart disease Father 33    heart  attack   History  Substance Use Topics  . Smoking status: Never Smoker   . Smokeless tobacco: Never Used  . Alcohol Use: No   OB History   Grav Para Term Preterm Abortions TAB SAB Ect Mult Living                 Review of Systems  Constitutional: Negative for fever.  Gastrointestinal: Negative for nausea, vomiting, abdominal pain and diarrhea.  Genitourinary: Negative for dysuria, urgency and frequency.  Musculoskeletal: Positive for back pain.  Neurological: Negative for weakness and numbness.    Allergies  Aspartame and Daliresp  Home Medications   Current Outpatient Rx  Name  Route  Sig  Dispense  Refill  . ALPRAZolam (XANAX) 0.5 MG tablet    Oral   Take 0.5 mg by mouth at bedtime as needed. For sleep         . amLODipine (NORVASC) 10 MG tablet   Oral   Take 10 mg by mouth daily with breakfast.          . calcium carbonate (TUMS - DOSED IN MG ELEMENTAL CALCIUM) 500 MG chewable tablet   Oral   Chew 1 tablet by mouth 3 (three) times daily as needed for heartburn. For heartburn         . Cholecalciferol (VITAMIN D) 1000 UNITS capsule   Oral   Take 6,000 Units by mouth daily.          [name] CORAL CALCIUM PO   Oral   Take 1 tablet by mouth daily. 1000mg          . Cyanocobalamin (VITAMIN B 12 PO)   Oral   Take 1 tablet by mouth daily.          [name] gabapentin (NEURONTIN) 300 MG capsule   Oral   Take 300 mg by mouth at bedtime.         . hydrochlorothiazide 25 MG tablet   Oral   Take 25 mg by mouth daily with breakfast.          . HYDROcodone-acetaminophen (NORCO/VICODIN) 5-325 MG per tablet   Oral   Take 1 tablet by mouth every 4 (four) hours as needed for pain.   21 tablet   0   . ibuprofen (ADVIL,MOTRIN) 400 MG tablet   Oral   Take 1 tablet (400 mg total) by mouth 3 (three) times daily. For 5 days - with food   30 tablet   0   . ipratropium-albuterol (DUONEB) 0.5-2.5 (3) MG/3ML SOLN   Nebulization   Take 3 mLs by nebulization 4 (four) times daily as needed. For shortness of breath   360 mL      . mometasone-formoterol (DULERA) 200-5 MCG/ACT AERO   Inhalation   Inhale 2 puffs into the lungs 2 (two) times daily.         . Multiple Vitamin (MULTIVITAMIN WITH MINERALS) TABS   Oral   Take 1 tablet by mouth daily.         [name] olmesartan (BENICAR) 20 MG tablet   Oral   Take 1 tablet (20 mg total) by mouth daily.         [name] omeprazole (PRILOSEC) 20 MG capsule   Oral   Take 20 mg by mouth daily.         . pravastatin (PRAVACHOL) 40 MG tablet   Oral   Take 40 mg by mouth at bedtime.           BP  157/89  Pulse 73  Temp(Src) 97.7 F (36.5 C) (Oral)  Resp 20  Ht 5\' 5"  (1.651 m)   Wt 174 lb (78.926 kg)  BMI 28.96 kg/m2  SpO2 99% Physical Exam  Nursing note and vitals reviewed. Constitutional: She appears well-developed and well-nourished. No distress.  HENT:  Head: Normocephalic and atraumatic.  Neck: Neck supple.  Cardiovascular: Normal rate and regular rhythm.   Pulmonary/Chest: Effort normal and breath sounds normal. No respiratory distress. She has no wheezes. She has no rales.  Abdominal: Soft. She exhibits no distension. There is no tenderness. There is no rebound and no guarding.  Musculoskeletal:       Back:  Lower extremities:  Strength 5/5, sensation intact, distal pulses intact.     Neurological: She is alert.  Skin: She is not diaphoretic.    ED Course   Procedures (including critical care time)  Labs Reviewed - No data to display No results found.  10:18 PM Discussed pt with Dr Blinda Leatherwood.   11:31 PM Patient reports great improvement of the pain with 4mg  morphine.  Would like to get robaxin as ordered and then D/C home with new prescription.  Pt aware that this prescription is to replace her vicodin  12:23 AM Patient feeling great relief.  Agrees with discharge home.    1. Chronic low back pain     MDM  Pt with chronic low back pain with uncontrolled pain.  No red flags for back pain.  Doubt acute cord compression or cauda equina. No need for imaging at this time.  Pain controlled in ED.  D/C home with percocet, (d/c norco), f/u with Dr Otelia Sergeant.  Discussed all results with patient.  Pt given return precautions.  Pt verbalizes understanding and agrees with plan.   Husband bedside and also agrees with plan.    [place], PA-C 12/10/12 [phone]

## 2012-12-09 NOTE — ED Notes (Signed)
Pt c/o chronic lower back pain, reports increase pain starting last night to her Right lower back. pt has a MRI scheduled for Tuesday but feels the pain is too unbearable to wait until Tuesday to be seen. Pt denies any problems with her bowel or bladder or injury

## 2012-12-10 ENCOUNTER — Encounter (HOSPITAL_COMMUNITY): Payer: Self-pay

## 2012-12-10 ENCOUNTER — Emergency Department (HOSPITAL_COMMUNITY)
Admission: EM | Admit: 2012-12-10 | Discharge: 2012-12-10 | Disposition: A | Discharge status: Home / Self Care | Payer: Medicare Other | Source: Home / Self Care | Attending: Emergency Medicine | Admitting: Emergency Medicine

## 2012-12-10 DIAGNOSIS — J45909 Unspecified asthma, uncomplicated: Secondary | ICD-10-CM | POA: Insufficient documentation

## 2012-12-10 DIAGNOSIS — K219 Gastro-esophageal reflux disease without esophagitis: Secondary | ICD-10-CM | POA: Insufficient documentation

## 2012-12-10 DIAGNOSIS — G8929 Other chronic pain: Secondary | ICD-10-CM

## 2012-12-10 DIAGNOSIS — Z8701 Personal history of pneumonia (recurrent): Secondary | ICD-10-CM | POA: Insufficient documentation

## 2012-12-10 DIAGNOSIS — Z888 Allergy status to other drugs, medicaments and biological substances status: Secondary | ICD-10-CM | POA: Insufficient documentation

## 2012-12-10 DIAGNOSIS — M545 Low back pain, unspecified: Secondary | ICD-10-CM | POA: Insufficient documentation

## 2012-12-10 DIAGNOSIS — I10 Essential (primary) hypertension: Secondary | ICD-10-CM | POA: Insufficient documentation

## 2012-12-10 DIAGNOSIS — M79609 Pain in unspecified limb: Secondary | ICD-10-CM | POA: Insufficient documentation

## 2012-12-10 DIAGNOSIS — E785 Hyperlipidemia, unspecified: Secondary | ICD-10-CM | POA: Insufficient documentation

## 2012-12-10 DIAGNOSIS — D509 Iron deficiency anemia, unspecified: Secondary | ICD-10-CM | POA: Insufficient documentation

## 2012-12-10 DIAGNOSIS — Z8719 Personal history of other diseases of the digestive system: Secondary | ICD-10-CM | POA: Insufficient documentation

## 2012-12-10 DIAGNOSIS — I2510 Atherosclerotic heart disease of native coronary artery without angina pectoris: Secondary | ICD-10-CM | POA: Insufficient documentation

## 2012-12-10 DIAGNOSIS — Z5189 Encounter for other specified aftercare: Secondary | ICD-10-CM | POA: Insufficient documentation

## 2012-12-10 DIAGNOSIS — Z79899 Other long term (current) drug therapy: Secondary | ICD-10-CM | POA: Insufficient documentation

## 2012-12-10 LAB — URINALYSIS, ROUTINE W REFLEX MICROSCOPIC
Glucose, UA: NEGATIVE mg/dL
Hgb urine dipstick: NEGATIVE
Leukocytes, UA: NEGATIVE
Specific Gravity, Urine: 1.02 (ref 1.005–1.030)
pH: 7.5 (ref 5.0–8.0)

## 2012-12-10 MED ORDER — OXYCODONE-ACETAMINOPHEN 5-325 MG PO TABS: 1.0000 | ORAL_TABLET | ORAL | Status: DC | PRN

## 2012-12-10 MED ORDER — MORPHINE SULFATE 4 MG/ML IJ SOLN
4.0000 mg | Freq: Once | INTRAMUSCULAR | Status: AC
  Administered 2012-12-10: 4 mg via INTRAMUSCULAR
  Filled 2012-12-10: qty 1

## 2012-12-10 NOTE — ED Notes (Signed)
Pt states she was having lower back pain and was seen here yesterday. Given Rx for percocet yesterday and was taking pain med but has not been helping. Pt returns for same.

## 2012-12-10 NOTE — ED Provider Notes (Signed)
Medical screening examination/treatment/procedure(s) were conducted as a shared visit with non-physician practitioner(s) and myself.  I personally evaluated the patient during the encounter  77 yo female with chronic back pain presenting with acute exacerbation.  Seen recently for same.  No red flag signs or symptoms for cord impingement.  NAD with normal lower extremity strength and sensation.  Moderate low back midline and lateral tenderness.  Does report urinary urgency, so will check UA and try to control pain.  No indication for emergent MRI.    Candyce Churn, MD 12/10/12 [phone]

## 2012-12-10 NOTE — ED Provider Notes (Signed)
CSN: [id number]     Arrival date & time 12/10/12  1191 History     First MD Initiated Contact with Patient 12/10/12 [phone]     Chief Complaint  Patient presents with  . Back Pain   (Consider location/radiation/quality/duration/timing/severity/associated sxs/prior Treatment) HPI Comments: 77 year old female with a past medical history of chronic low back pain managed by Dr. Otelia Sergeant with orthopedics presents back to the emergency department with her husband after being seen later last night with continuing pain. Patient states this is the same pain as her "normal low back pain" radiating down her right leg, however as soon as she left the emergency department her pain returned, severe. She was discharged with a prescription for Percocet, had this filled but did not take any of the pills. She is scheduled for an MRI tomorrow, however has been states he is hoping to have MRI done today. Denies loss of control bowels or bladder or saddle anesthesia. No fever or chills. No new injury or trauma reported.  Patient is a 77 y.o. female presenting with back pain. The history is provided by the patient and the spouse.  Back Pain   Past Medical History  Diagnosis Date  . CAD (coronary artery disease)     LHC 2003 with 40% pLAD, 30% mLAD, 100% D1 (moderate vessel), 100%  D2 (small vessel), 95% mid small OM2.  Pt had PTCA to D1;  D2 and OM2 small vessels and tx medically;  Last Myoview (5/09):  EF 66%, anteroseptal/apical infarct with minimal peri-infarct ischemia.  No significant change from 11/06. Pt managed medically  . Recurrent aspiration bronchitis/pneumonia     PFTs 12/09 FVC 97%, FEV1 101%, ratio 77%, TLC 109%. No significant obstruction or restriction.   . Hypertension   . Hyperlipidemia   . Esophageal stricture   . Diverticulosis of colon   . Gastroesophageal reflux disease with hiatal hernia   . Iron deficiency anemia   . Gastritis   . GERD (gastroesophageal reflux disease)   . Hiatal hernia     . Asthma   . Blood transfusion   . Cough   . Wheezing   . Weakness   . Trouble swallowing   . Abdominal pain   . Hiatal hernia   . Shortness of breath    Past Surgical History  Procedure Laterality Date  . Back surgery    . Cholecystectomy    . Bladder repair    . Hemorrhoid surgery    . Cholecystectomy      Patient unsure of date.  [name] Appendectomy      patient unsure of date  . Total abdominal hysterectomy  1975    partial  . Coronary angioplasty  11/16/2001  . Eye surgery  11/2000    bilateral cataracts with lens implant  . Hiatal hernia repair  06/03/2011    Procedure: LAPAROSCOPIC REPAIR OF HIATAL HERNIA;  Surgeon: Ernestene Mention, MD;  Location: [facility];  Service: General;  Laterality: N/A;  . Laparoscopic nissen fundoplication  06/03/2011    Procedure: LAPAROSCOPIC NISSEN FUNDOPLICATION;  Surgeon: Ernestene Mention, MD;  Location: [facility];  Service: General;  Laterality: N/A;  . Lumbar laminectomy/decompression microdiscectomy Right 06/26/2012    Procedure: LUMBAR LAMINECTOMY/DECOMPRESSION MICRODISCECTOMY;  Surgeon: Kerrin Champagne, MD;  [facility];  Service: Orthopedics;  Laterality: Right;  RIGHT L4-5 MICRODISCECTOMY   Family History  Problem Relation Age of Onset  . Cancer Mother     colon   . Colon cancer Mother 86  .  Heart disease Father 53    heart attack   History  Substance Use Topics  . Smoking status: Never Smoker   . Smokeless tobacco: Never Used  . Alcohol Use: No   OB History   Grav Para Term Preterm Abortions TAB SAB Ect Mult Living                 Review of Systems  Musculoskeletal: Positive for back pain.  All other systems reviewed and are negative.    Allergies  Aspartame and Daliresp  Home Medications   Current Outpatient Rx  Name  Route  Sig  Dispense  Refill  . Aclidinium Bromide (TUDORZA PRESSAIR) 400 MCG/ACT AEPB   Inhalation   Inhale 1 puff into the lungs 2 (two) times daily.         [name] ALPRAZolam (XANAX) 0.5 MG  tablet   Oral   Take 0.5 mg by mouth at bedtime as needed. For sleep         . amLODipine (NORVASC) 10 MG tablet   Oral   Take 10 mg by mouth daily with breakfast.          . calcium carbonate (TUMS - DOSED IN MG ELEMENTAL CALCIUM) 500 MG chewable tablet   Oral   Chew 1 tablet by mouth 3 (three) times daily as needed for heartburn. For heartburn         . Cholecalciferol (VITAMIN D) 1000 UNITS capsule   Oral   Take 6,000 Units by mouth daily.          [name] CORAL CALCIUM PO   Oral   Take 1 tablet by mouth at bedtime. 1000mg          . Cyanocobalamin (VITAMIN B 12 PO)   Oral   Take 1 tablet by mouth daily.          [name] gabapentin (NEURONTIN) 300 MG capsule   Oral   Take 300 mg by mouth at bedtime.         . hydrochlorothiazide 25 MG tablet   Oral   Take 25 mg by mouth daily with breakfast.          . ipratropium-albuterol (DUONEB) 0.5-2.5 (3) MG/3ML SOLN   Nebulization   Take 3 mLs by nebulization 4 (four) times daily as needed. For shortness of breath   360 mL      . metoprolol tartrate (LOPRESSOR) 25 MG tablet   Oral   Take 12.5 mg by mouth at bedtime.         . mometasone-formoterol (DULERA) 200-5 MCG/ACT AERO   Inhalation   Inhale 2 puffs into the lungs 2 (two) times daily.         . Multiple Vitamin (MULTIVITAMIN WITH MINERALS) TABS   Oral   Take 1 tablet by mouth daily.         [name] omeprazole (PRILOSEC) 20 MG capsule   Oral   Take 20 mg by mouth daily.         [name] oxyCODONE-acetaminophen (PERCOCET) 5-325 MG per tablet   Oral   Take 1-2 tablets by mouth every 4 (four) hours as needed for pain.   20 tablet   0   . pantoprazole (PROTONIX) 40 MG tablet   Oral   Take 40 mg by mouth 2 (two) times daily.         . pravastatin (PRAVACHOL) 40 MG tablet   Oral   Take 40 mg by mouth at bedtime.  BP 125/58  Pulse 61  Temp(Src) 98 F (36.7 C) (Oral)  Resp 18  SpO2 96% Physical Exam  Nursing note and vitals  reviewed. Constitutional: She is oriented to person, place, and time. She appears well-developed and well-nourished. No distress.  HENT:  Head: Normocephalic and atraumatic.  Mouth/Throat: Oropharynx is clear and moist.  Eyes: Conjunctivae are normal.  Neck: Normal range of motion. Neck supple.  Cardiovascular: Normal rate, regular rhythm, normal heart sounds and intact distal pulses.   Pulses:      Dorsalis pedis pulses are 2+ on the right side, and 2+ on the left side.       Posterior tibial pulses are 2+ on the right side, and 2+ on the left side.  Pulmonary/Chest: Effort normal and breath sounds normal.  Abdominal: Soft. Bowel sounds are normal. She exhibits no distension. There is no tenderness.  Musculoskeletal: Normal range of motion. She exhibits no edema.       Back:  Neurological: She is alert and oriented to person, place, and time. She has normal strength. No sensory deficit.  Strength 5/5 lower extremities and equal bilateral.   Skin: Skin is warm and dry. She is not diaphoretic.  Psychiatric: She has a normal mood and affect. Her behavior is normal.    ED Course   Procedures (including critical care time)  Labs Reviewed - No data to display No results found. 1. Chronic back pain     MDM  Patient with chronic low back pain, hopes of having MRI today rather than tomorrow. Same as her "typical back pain". No red flags concerning patient's back pain. Afebrile, normal vital signs, NAD. Neuro exam unremarkable, no signs of cauda equina. I explained to patient and husband that there are no findings on exam to warrant an emergent MRI today. Will manage her pain in the ED, advised to use percocet prescribed. Will check UA. Patient discussed with Dr. Loretha Stapler who agrees with plan of care.  10:25 AM Reports her pain has "eased off quite a bit" after receiving morphine. Awaiting UA. 11:34 AM UA clear. Patient able to ambulate in the ED, reports pain, however appears to not be  having any difficulty. She did not report pain when walking to the bathroom prior to obtaining urine. Stable for discharge.  Trevor Mace, PA-C 12/10/12 1134

## 2012-12-10 NOTE — ED Notes (Signed)
Robyn, PA at the bedside.  

## 2012-12-11 NOTE — ED Provider Notes (Signed)
Medical screening examination/treatment/procedure(s) were performed by non-physician practitioner and as supervising physician I was immediately available for consultation/collaboration.    Yang Rack J. Aireanna Luellen, MD 12/11/12 0721 

## 2012-12-13 NOTE — ED Provider Notes (Signed)
Medical screening examination/treatment/procedure(s) were conducted as a shared visit with non-physician practitioner(s) and myself.  I personally evaluated the patient during the encounter.   Please see my separate note.    Candyce Churn, MD 12/13/12 [phone]

## 2012-12-30 ENCOUNTER — Emergency Department (HOSPITAL_COMMUNITY): Payer: Medicare Other

## 2012-12-30 ENCOUNTER — Emergency Department (HOSPITAL_COMMUNITY)
Admission: EM | Admit: 2012-12-30 | Discharge: 2012-12-30 | Disposition: A | Discharge status: Home / Self Care | Payer: Medicare Other | Attending: Emergency Medicine | Admitting: Emergency Medicine

## 2012-12-30 ENCOUNTER — Encounter (HOSPITAL_COMMUNITY): Payer: Self-pay | Admitting: Cardiology

## 2012-12-30 DIAGNOSIS — K219 Gastro-esophageal reflux disease without esophagitis: Secondary | ICD-10-CM | POA: Insufficient documentation

## 2012-12-30 DIAGNOSIS — R112 Nausea with vomiting, unspecified: Secondary | ICD-10-CM | POA: Insufficient documentation

## 2012-12-30 DIAGNOSIS — Z8719 Personal history of other diseases of the digestive system: Secondary | ICD-10-CM | POA: Insufficient documentation

## 2012-12-30 DIAGNOSIS — R197 Diarrhea, unspecified: Secondary | ICD-10-CM | POA: Insufficient documentation

## 2012-12-30 DIAGNOSIS — Z9089 Acquired absence of other organs: Secondary | ICD-10-CM | POA: Insufficient documentation

## 2012-12-30 DIAGNOSIS — R509 Fever, unspecified: Secondary | ICD-10-CM | POA: Insufficient documentation

## 2012-12-30 DIAGNOSIS — R000 Tachycardia, unspecified: Secondary | ICD-10-CM | POA: Insufficient documentation

## 2012-12-30 DIAGNOSIS — R630 Anorexia: Secondary | ICD-10-CM | POA: Insufficient documentation

## 2012-12-30 DIAGNOSIS — Z862 Personal history of diseases of the blood and blood-forming organs and certain disorders involving the immune mechanism: Secondary | ICD-10-CM | POA: Insufficient documentation

## 2012-12-30 DIAGNOSIS — K529 Noninfective gastroenteritis and colitis, unspecified: Secondary | ICD-10-CM

## 2012-12-30 DIAGNOSIS — Z79899 Other long term (current) drug therapy: Secondary | ICD-10-CM | POA: Insufficient documentation

## 2012-12-30 DIAGNOSIS — Z5189 Encounter for other specified aftercare: Secondary | ICD-10-CM | POA: Insufficient documentation

## 2012-12-30 DIAGNOSIS — Z8639 Personal history of other endocrine, nutritional and metabolic disease: Secondary | ICD-10-CM | POA: Insufficient documentation

## 2012-12-30 DIAGNOSIS — K5289 Other specified noninfective gastroenteritis and colitis: Secondary | ICD-10-CM | POA: Insufficient documentation

## 2012-12-30 DIAGNOSIS — J45909 Unspecified asthma, uncomplicated: Secondary | ICD-10-CM | POA: Insufficient documentation

## 2012-12-30 DIAGNOSIS — I10 Essential (primary) hypertension: Secondary | ICD-10-CM | POA: Insufficient documentation

## 2012-12-30 DIAGNOSIS — IMO0002 Reserved for concepts with insufficient information to code with codable children: Secondary | ICD-10-CM | POA: Insufficient documentation

## 2012-12-30 LAB — CBC WITH DIFFERENTIAL/PLATELET
Basophils Absolute: 0 10*3/uL (ref 0.0–0.1)
Eosinophils Absolute: 0 10*3/uL (ref 0.0–0.7)
Eosinophils Relative: 0 % (ref 0–5)
HCT: 43.2 % (ref 36.0–46.0)
Lymphocytes Relative: 14 % (ref 12–46)
Lymphs Abs: 1.1 10*3/uL (ref 0.7–4.0)
MCH: 29.7 pg (ref 26.0–34.0)
MCV: 86.1 fL (ref 78.0–100.0)
Monocytes Absolute: 0.6 10*3/uL (ref 0.1–1.0)
RDW: 16.3 % — ABNORMAL HIGH (ref 11.5–15.5)
WBC: 7.8 10*3/uL (ref 4.0–10.5)

## 2012-12-30 LAB — COMPREHENSIVE METABOLIC PANEL
CO2: 25 mEq/L (ref 19–32)
Calcium: 10.4 mg/dL (ref 8.4–10.5)
Creatinine, Ser: 0.76 mg/dL (ref 0.50–1.10)
GFR calc Af Amer: >90 mL/min (ref >90)
GFR calc non Af Amer: 80 mL/min — ABNORMAL LOW (ref >90)
Glucose, Bld: 136 mg/dL — ABNORMAL HIGH (ref 70–99)
Total Protein: 8 g/dL (ref 6.0–8.3)

## 2012-12-30 LAB — URINALYSIS, ROUTINE W REFLEX MICROSCOPIC
Leukocytes, UA: NEGATIVE
Nitrite: NEGATIVE
Protein, ur: 30 mg/dL — AB
Urobilinogen, UA: 0.2 mg/dL (ref 0.0–1.0)

## 2012-12-30 LAB — URINE MICROSCOPIC-ADD ON

## 2012-12-30 MED ORDER — SODIUM CHLORIDE 0.9 % IV BOLUS (SEPSIS)
1000.0000 mL | Freq: Once | INTRAVENOUS | Status: AC
  Administered 2012-12-30: 1000 mL via INTRAVENOUS

## 2012-12-30 MED ORDER — ONDANSETRON HCL 4 MG/2ML IJ SOLN
4.0000 mg | Freq: Once | INTRAMUSCULAR | Status: AC
  Administered 2012-12-30: 4 mg via INTRAVENOUS
  Filled 2012-12-30: qty 2

## 2012-12-30 MED ORDER — ONDANSETRON 4 MG PO TBDP: ORAL_TABLET | ORAL | Status: DC

## 2012-12-30 NOTE — ED Notes (Signed)
Patient returned from X-ray 

## 2012-12-30 NOTE — ED Notes (Signed)
Tolerating saltine crackers and gingerale. No further vomiting since Zofran given. States she is feeling better.

## 2012-12-30 NOTE — ED Notes (Signed)
Patient transported to X-ray 

## 2012-12-30 NOTE — ED Notes (Signed)
Pt reports that she developed n/v/d last night around 1900. States that she was feeling better but then the symptoms returned this afternoon. Denies any urinary symptoms. Skin warm and dry. Pt with vomiting at triage.

## 2012-12-30 NOTE — ED Provider Notes (Signed)
CSN: [id number]     Arrival date & time 12/30/12  1344 History     First MD Initiated Contact with Patient 12/30/12 1501     Chief Complaint  Patient presents with  . Abdominal Pain  . Emesis  . Diarrhea   (Consider location/radiation/quality/duration/timing/severity/associated sxs/prior Treatment) HPI Comments: Pt reports that she developed n/v/d last night around 1900. States that she was feeling better but then the symptoms returned around 10am today, again with n/v/d.  Denies any urinary symptoms. If 100 and to him She reports abdomen now sore from vomiting.  Patient is a 77 y.o. female presenting with abdominal pain, vomiting, and diarrhea. The history is provided by the patient. No language interpreter was used.  Abdominal Pain Pain location:  Generalized Pain quality comment:  Soreness Pain radiates to:  Does not radiate Pain severity:  Mild Onset quality:  Gradual Duration:  1 day Timing:  Constant Progression:  Unchanged Chronicity:  New Context: previous surgery   Context: not medication withdrawal, not recent illness, not sick contacts, not suspicious food intake and not trauma   Relieved by:  Nothing Worsened by:  Vomiting Ineffective treatments:  None tried Associated symptoms: anorexia, diarrhea, fever, nausea and vomiting   Associated symptoms: no belching, no chest pain, no chills, no cough, no dysuria, no fatigue, no melena, no shortness of breath and no sore throat   Associated symptoms comment:  Diarrhea, about 15 times, loose, non-bloody  Diarrhea:    Quality:  Semi-solid   Number of occurrences:  15   Severity:  Moderate   Duration:  1 day   Timing:  Intermittent   Progression:  Unchanged Fever:    Duration:  1 day   Timing:  Intermittent   Temp source:  Subjective   Progression:  Unchanged Nausea:    Severity:  Severe   Onset quality:  Sudden   Duration:  1 day   Timing:  Constant   Progression:  Unchanged Vomiting:    Quality:  Bilious  material   Number of occurrences:  15   Severity:  Moderate   Duration:  1 day   Timing:  Intermittent   Progression:  Unchanged Risk factors: being elderly, multiple surgeries and obesity   Risk factors: no alcohol abuse, no aspirin use, no NSAID use and no recent hospitalization   Emesis Associated symptoms: abdominal pain and diarrhea   Associated symptoms: no arthralgias, no chills, no headaches and no sore throat   Diarrhea Associated symptoms: abdominal pain, fever and vomiting   Associated symptoms: no arthralgias, no chills, no diaphoresis and no headaches     Past Medical History  Diagnosis Date  . CAD (coronary artery disease)     LHC 2003 with 40% pLAD, 30% mLAD, 100% D1 (moderate vessel), 100%  D2 (small vessel), 95% mid small OM2.  Pt had PTCA to D1;  D2 and OM2 small vessels and tx medically;  Last Myoview (5/09):  EF 66%, anteroseptal/apical infarct with minimal peri-infarct ischemia.  No significant change from 11/06. Pt managed medically  . Recurrent aspiration bronchitis/pneumonia     PFTs 12/09 FVC 97%, FEV1 101%, ratio 77%, TLC 109%. No significant obstruction or restriction.   . Hypertension   . Hyperlipidemia   . Esophageal stricture   . Diverticulosis of colon   . Gastroesophageal reflux disease with hiatal hernia   . Iron deficiency anemia   . Gastritis   . GERD (gastroesophageal reflux disease)   . Hiatal hernia   . Asthma   .  Blood transfusion   . Cough   . Wheezing   . Weakness   . Trouble swallowing   . Abdominal pain   . Hiatal hernia   . Shortness of breath    Past Surgical History  Procedure Laterality Date  . Back surgery    . Cholecystectomy    . Bladder repair    . Hemorrhoid surgery    . Cholecystectomy      Patient unsure of date.  [name] Appendectomy      patient unsure of date  . Total abdominal hysterectomy  1975    partial  . Coronary angioplasty  11/16/2001  . Eye surgery  11/2000    bilateral cataracts with lens implant  .  Hiatal hernia repair  06/03/2011    Procedure: LAPAROSCOPIC REPAIR OF HIATAL HERNIA;  Surgeon: Ernestene Mention, MD;  Location: [facility];  Service: General;  Laterality: N/A;  . Laparoscopic nissen fundoplication  06/03/2011    Procedure: LAPAROSCOPIC NISSEN FUNDOPLICATION;  Surgeon: Ernestene Mention, MD;  Location: [facility];  Service: General;  Laterality: N/A;  . Lumbar laminectomy/decompression microdiscectomy Right 06/26/2012    Procedure: LUMBAR LAMINECTOMY/DECOMPRESSION MICRODISCECTOMY;  Surgeon: Kerrin Champagne, MD;  Location: [facility] OR;  Service: Orthopedics;  Laterality: Right;  RIGHT L4-5 MICRODISCECTOMY   Family History  Problem Relation Age of Onset  . Cancer Mother     colon   . Colon cancer Mother 53  . Heart disease Father 56    heart attack   History  Substance Use Topics  . Smoking status: Never Smoker   . Smokeless tobacco: Never Used  . Alcohol Use: No   OB History   Grav Para Term Preterm Abortions TAB SAB Ect Mult Living                 Review of Systems  Constitutional: Positive for fever and appetite change. Negative for chills, diaphoresis, activity change and fatigue.  HENT: Negative for congestion, sore throat, facial swelling, rhinorrhea, neck pain and neck stiffness.   Eyes: Negative for photophobia and discharge.  Respiratory: Negative for cough, chest tightness and shortness of breath.   Cardiovascular: Negative for chest pain, palpitations and leg swelling.  Gastrointestinal: Positive for nausea, vomiting, abdominal pain, diarrhea and anorexia. Negative for melena.  Endocrine: Negative for polydipsia and polyuria.  Genitourinary: Negative for dysuria, frequency, difficulty urinating and pelvic pain.  Musculoskeletal: Negative for back pain and arthralgias.  Skin: Negative for color change and wound.  Allergic/Immunologic: Negative for immunocompromised state.  Neurological: Negative for facial asymmetry, weakness, numbness and headaches.  Hematological: Does  not bruise/bleed easily.  Psychiatric/Behavioral: Negative for confusion and agitation.    Allergies  Aspartame and Daliresp  Home Medications   Current Outpatient Rx  Name  Route  Sig  Dispense  Refill  . Aclidinium Bromide (TUDORZA PRESSAIR) 400 MCG/ACT AEPB   Inhalation   Inhale 1 puff into the lungs 2 (two) times daily.         [name] ALPRAZolam (XANAX) 0.5 MG tablet   Oral   Take 0.5 mg by mouth at bedtime as needed. For sleep         . amLODipine (NORVASC) 10 MG tablet   Oral   Take 10 mg by mouth every morning.          . Cholecalciferol (VITAMIN D) 1000 UNITS capsule   Oral   Take 6,000 Units by mouth daily.          [name] CORAL  CALCIUM PO   Oral   Take 1 tablet by mouth at bedtime. 1000mg          . Cyanocobalamin (VITAMIN B 12 PO)   Oral   Take 1 tablet by mouth daily.          [name] gabapentin (NEURONTIN) 300 MG capsule   Oral   Take 300 mg by mouth at bedtime.         . hydrochlorothiazide 25 MG tablet   Oral   Take 25 mg by mouth daily with breakfast.          . metoprolol tartrate (LOPRESSOR) 25 MG tablet   Oral   Take 12.5 mg by mouth at bedtime.         . mometasone-formoterol (DULERA) 200-5 MCG/ACT AERO   Inhalation   Inhale 2 puffs into the lungs 2 (two) times daily.         . Multiple Vitamin (MULTIVITAMIN WITH MINERALS) TABS   Oral   Take 1 tablet by mouth daily.         [name] omeprazole (PRILOSEC) 20 MG capsule   Oral   Take 20 mg by mouth daily.         [name] oxyCODONE-acetaminophen (PERCOCET/ROXICET) 5-325 MG per tablet   Oral   Take 1 tablet by mouth every 4 (four) hours as needed for pain.         . pantoprazole (PROTONIX) 40 MG tablet   Oral   Take 40 mg by mouth 2 (two) times daily.         . ondansetron (ZOFRAN ODT) 4 MG disintegrating tablet      4mg  ODT q4 hours prn nausea/vomit   10 tablet   0    BP 144/76  Pulse 98  Temp(Src) 99 F (37.2 C) (Oral)  Resp 16  SpO2 98% Physical Exam  Constitutional:  She is oriented to person, place, and time. She appears well-developed and well-nourished. No distress.  HENT:  Head: Normocephalic and atraumatic.  Mouth/Throat: No oropharyngeal exudate.  Mucus membranes mildly dry  Eyes: Pupils are equal, round, and reactive to light.  Neck: Normal range of motion. Neck supple.  Cardiovascular: Regular rhythm and normal heart sounds.  Tachycardia present.  Exam reveals no gallop and no friction rub.   No murmur heard. Pulmonary/Chest: Effort normal and breath sounds normal. No respiratory distress. She has no wheezes. She has no rales.  Abdominal: Soft. Bowel sounds are normal. She exhibits no distension and no mass. There is tenderness (generalized). There is no rigidity, no rebound and no guarding.  Musculoskeletal: Normal range of motion. She exhibits no edema and no tenderness.  Neurological: She is alert and oriented to person, place, and time.  Skin: Skin is warm and dry.  Psychiatric: She has a normal mood and affect.    ED Course   Procedures (including critical care time)  Labs Reviewed  CBC WITH DIFFERENTIAL - Abnormal; Notable for the following:    RDW 16.3 (*)    Platelets 515 (*)    All other components within normal limits  COMPREHENSIVE METABOLIC PANEL - Abnormal; Notable for the following:    Glucose, Bld 136 (*)    Alkaline Phosphatase 158 (*)    GFR calc non Af Amer 80 (*)    All other components within normal limits  LIPASE, BLOOD - Abnormal; Notable for the following:    Lipase 9 (*)    All other components within normal limits  URINALYSIS, ROUTINE W  REFLEX MICROSCOPIC - Abnormal; Notable for the following:    pH 8.5 (*)    Ketones, ur 15 (*)    Protein, ur 30 (*)    All other components within normal limits  URINE MICROSCOPIC-ADD ON - Abnormal; Notable for the following:    Casts HYALINE CASTS (*)    All other components within normal limits  CG4 I-STAT (LACTIC ACID)   Dg Abd Acute W/chest  12/30/2012   *RADIOLOGY  REPORT*  Clinical Data: Abdominal pain.  Nausea and vomiting.  Diarrhea. Multiple prior abdominal surgeries.  ACUTE ABDOMEN SERIES (ABDOMEN 2 VIEW & CHEST 1 VIEW)  Comparison: CT abdomen and pelvis 06/20/2012.  Portable chest x-ray 06/25/2012.  Two-view chest x-ray 01/10/2012.  Findings: Paucity of bowel gas.  No evidence of bowel obstruction or significant ileus.  No significant stool burden in the colon. Surgical clips in the right upper quadrant prior cholecystectomy. Phleboliths in the pelvis.  No visible opaque urinary tract calculi.  No evidence of free air on the erect image.  No visible psoas margins.  Degenerative changes involving the thoracic spine.  Cardiac silhouette upper normal in size to slightly enlarged but stable.  Thoracic aorta mildly atherosclerotic, unchanged.  Small to moderate sized hiatal hernia, unchanged.  Hilar and mediastinal contours otherwise unremarkable.  Lungs clear.  Bronchovascular markings normal.  Pulmonary vascularity normal.  No pneumothorax. No pleural effusions.  IMPRESSION:  1.  No acute abdominal abnormality. 2.  Stable borderline heart size and small to moderate sized hiatal hernia.  No acute cardiopulmonary disease.   Original Report Authenticated By: Hulan Saas, M.D.   1. Gastroenteritis      Date: 12/30/2012  Rate: 98  Rhythm: normal sinus rhythm  QRS Axis: normal  Intervals: normal  ST/T Wave abnormalities: TW flattening diffusely  Conduction Disutrbances:none  Narrative Interpretation:   Old EKG Reviewed: unchanged    MDM  Pt is a 77 y.o. female with Pmhx as above who presents with about 20 hours of n/v/d.  Symptoms began last night, stopped overnight then began again about 10a this morning. She also reports abdominal soreness that she feels is from vomiting. Pt tachycardic w/ dry mouth on exam. +genrealized abdominal pain w/o rebound or guarding, nml bowel sounds. Have given IVF, IV zofran.  W/U showed no leukocytosis, stable Hb, plt  elevation at 515, CMP unremarkable except for alk phos of 158, nml lipase, clean UA, AAS w/o acute findings and no LA elevation.  Pt felt improved after IVF, zofran.  She had no further vomiting in department and was able to tolerate soda & crackers, would like to try outpt treatment w/ zofran.  Doubt SBO, pancreatitis, cholecystitis or other surgical cause of ab pain.  I feel she likely has viral gastroenteritis.  Return precautions given for new or worsening symptoms including worsening pain, inability to tolerate PO.   1. Gastroenteritis        Shanna Cisco, MD 12/31/12 [phone]

## 2012-12-30 NOTE — ED Notes (Signed)
Nausea and vomiting subsided at this time. Tolerating PO gingerale so far.

## 2013-01-16 LAB — HM DEXA SCAN

## 2013-01-23 ENCOUNTER — Emergency Department (HOSPITAL_COMMUNITY)
Admission: EM | Admit: 2013-01-23 | Discharge: 2013-01-23 | Disposition: A | Discharge status: Home / Self Care | Payer: Medicare Other | Attending: Emergency Medicine | Admitting: Emergency Medicine

## 2013-01-23 ENCOUNTER — Encounter (HOSPITAL_COMMUNITY): Payer: Self-pay | Admitting: Emergency Medicine

## 2013-01-23 DIAGNOSIS — Z8701 Personal history of pneumonia (recurrent): Secondary | ICD-10-CM | POA: Insufficient documentation

## 2013-01-23 DIAGNOSIS — R112 Nausea with vomiting, unspecified: Secondary | ICD-10-CM | POA: Insufficient documentation

## 2013-01-23 DIAGNOSIS — D509 Iron deficiency anemia, unspecified: Secondary | ICD-10-CM | POA: Insufficient documentation

## 2013-01-23 DIAGNOSIS — E876 Hypokalemia: Secondary | ICD-10-CM | POA: Insufficient documentation

## 2013-01-23 DIAGNOSIS — Z9089 Acquired absence of other organs: Secondary | ICD-10-CM | POA: Insufficient documentation

## 2013-01-23 DIAGNOSIS — I10 Essential (primary) hypertension: Secondary | ICD-10-CM | POA: Insufficient documentation

## 2013-01-23 DIAGNOSIS — I2510 Atherosclerotic heart disease of native coronary artery without angina pectoris: Secondary | ICD-10-CM | POA: Insufficient documentation

## 2013-01-23 DIAGNOSIS — Z79899 Other long term (current) drug therapy: Secondary | ICD-10-CM | POA: Insufficient documentation

## 2013-01-23 DIAGNOSIS — Z9889 Other specified postprocedural states: Secondary | ICD-10-CM | POA: Insufficient documentation

## 2013-01-23 DIAGNOSIS — IMO0002 Reserved for concepts with insufficient information to code with codable children: Secondary | ICD-10-CM | POA: Insufficient documentation

## 2013-01-23 DIAGNOSIS — J45909 Unspecified asthma, uncomplicated: Secondary | ICD-10-CM | POA: Insufficient documentation

## 2013-01-23 DIAGNOSIS — Z90710 Acquired absence of both cervix and uterus: Secondary | ICD-10-CM | POA: Insufficient documentation

## 2013-01-23 DIAGNOSIS — K219 Gastro-esophageal reflux disease without esophagitis: Secondary | ICD-10-CM | POA: Insufficient documentation

## 2013-01-23 DIAGNOSIS — E785 Hyperlipidemia, unspecified: Secondary | ICD-10-CM | POA: Insufficient documentation

## 2013-01-23 LAB — BASIC METABOLIC PANEL
BUN: 10 mg/dL (ref 6–23)
GFR calc non Af Amer: 80 mL/min — ABNORMAL LOW (ref >90)
Glucose, Bld: 134 mg/dL — ABNORMAL HIGH (ref 70–99)
Potassium: 2.9 mEq/L — ABNORMAL LOW (ref 3.5–5.1)

## 2013-01-23 LAB — CBC WITH DIFFERENTIAL/PLATELET
Eosinophils Absolute: 0.1 10*3/uL (ref 0.0–0.7)
Eosinophils Relative: 2 % (ref 0–5)
HCT: 39.3 % (ref 36.0–46.0)
Hemoglobin: 14 g/dL (ref 12.0–15.0)
Lymphs Abs: 1.4 10*3/uL (ref 0.7–4.0)
MCH: 29.9 pg (ref 26.0–34.0)
MCV: 84 fL (ref 78.0–100.0)
Monocytes Relative: 10 % (ref 3–12)
Neutrophils Relative %: 66 % (ref 43–77)
RBC: 4.68 MIL/uL (ref 3.87–5.11)

## 2013-01-23 MED ORDER — SODIUM CHLORIDE 0.9 % IV SOLN
Freq: Once | INTRAVENOUS | Status: AC
  Administered 2013-01-23: 05:00:00 via INTRAVENOUS

## 2013-01-23 MED ORDER — POTASSIUM CHLORIDE 10 MEQ/100ML IV SOLN
10.0000 meq | Freq: Once | INTRAVENOUS | Status: AC
  Administered 2013-01-23: 10 meq via INTRAVENOUS
  Filled 2013-01-23: qty 100

## 2013-01-23 MED ORDER — METOCLOPRAMIDE HCL 5 MG/ML IJ SOLN
10.0000 mg | Freq: Once | INTRAMUSCULAR | Status: AC
  Administered 2013-01-23: 10 mg via INTRAVENOUS
  Filled 2013-01-23: qty 2

## 2013-01-23 MED ORDER — DIPHENHYDRAMINE HCL 50 MG/ML IJ SOLN
25.0000 mg | Freq: Once | INTRAMUSCULAR | Status: AC
  Administered 2013-01-23: 25 mg via INTRAVENOUS
  Filled 2013-01-23: qty 1

## 2013-01-23 MED ORDER — METOCLOPRAMIDE HCL 10 MG PO TABS: 10.0000 mg | ORAL_TABLET | Freq: Four times a day (QID) | ORAL | Status: DC | PRN

## 2013-01-23 MED ORDER — ONDANSETRON HCL 4 MG/2ML IJ SOLN
4.0000 mg | Freq: Once | INTRAMUSCULAR | Status: AC
  Administered 2013-01-23: 4 mg via INTRAVENOUS
  Filled 2013-01-23: qty 2

## 2013-01-23 NOTE — ED Notes (Signed)
Patients states she has been throwing up since midnight non stop. Actively, yellow emesis. Patient states this happens off and on and she has been treated in the ED before for this with IV nausea medication, and fluids.

## 2013-01-23 NOTE — ED Provider Notes (Signed)
CSN: [id number]     Arrival date & time 01/23/13  0350 History   First MD Initiated Contact with Patient 01/23/13 0550     Chief Complaint  Patient presents with  . Vomiting   (Consider location/radiation/quality/duration/timing/severity/associated sxs/prior Treatment) The history is provided by the patient.  77 year old female started having nausea and vomiting last night. Symptoms are severe and she is vomited multiple times. She has had chills and felt like she might have a fever but did not take her temperature. She did not break out in sweats. She denies abdominal pain, diarrhea. She has had sick contact with a family member who just felt that but did not have nausea and vomiting. She has not done anything to treat her symptoms at home.  Past Medical History  Diagnosis Date  . CAD (coronary artery disease)     LHC 2003 with 40% pLAD, 30% mLAD, 100% D1 (moderate vessel), 100%  D2 (small vessel), 95% mid small OM2.  Pt had PTCA to D1;  D2 and OM2 small vessels and tx medically;  Last Myoview (5/09):  EF 66%, anteroseptal/apical infarct with minimal peri-infarct ischemia.  No significant change from 11/06. Pt managed medically  . Recurrent aspiration bronchitis/pneumonia     PFTs 12/09 FVC 97%, FEV1 101%, ratio 77%, TLC 109%. No significant obstruction or restriction.   . Hypertension   . Hyperlipidemia   . Esophageal stricture   . Diverticulosis of colon   . Gastroesophageal reflux disease with hiatal hernia   . Iron deficiency anemia   . Gastritis   . GERD (gastroesophageal reflux disease)   . Hiatal hernia   . Asthma   . Blood transfusion   . Cough   . Wheezing   . Weakness   . Trouble swallowing   . Abdominal pain   . Hiatal hernia   . Shortness of breath    Past Surgical History  Procedure Laterality Date  . Back surgery    . Cholecystectomy    . Bladder repair    . Hemorrhoid surgery    . Cholecystectomy      Patient unsure of date.  [name] Appendectomy      patient  unsure of date  . Total abdominal hysterectomy  1975    partial  . Coronary angioplasty  11/16/2001  . Eye surgery  11/2000    bilateral cataracts with lens implant  . Hiatal hernia repair  06/03/2011    Procedure: LAPAROSCOPIC REPAIR OF HIATAL HERNIA;  Surgeon: Ernestene Mention, MD;  Location: [facility];  Service: General;  Laterality: N/A;  . Laparoscopic nissen fundoplication  06/03/2011    Procedure: LAPAROSCOPIC NISSEN FUNDOPLICATION;  Surgeon: Ernestene Mention, MD;  Location: [facility];  Service: General;  Laterality: N/A;  . Lumbar laminectomy/decompression microdiscectomy Right 06/26/2012    Procedure: LUMBAR LAMINECTOMY/DECOMPRESSION MICRODISCECTOMY;  Surgeon: Kerrin Champagne, MD;  Location: [facility] OR;  Service: Orthopedics;  Laterality: Right;  RIGHT L4-5 MICRODISCECTOMY   Family History  Problem Relation Age of Onset  . Cancer Mother     colon   . Colon cancer Mother 54  . Heart disease Father 34    heart attack   History  Substance Use Topics  . Smoking status: Never Smoker   . Smokeless tobacco: Never Used  . Alcohol Use: No   OB History   Grav Para Term Preterm Abortions TAB SAB Ect Mult Living                 Review  of Systems  All other systems reviewed and are negative.    Allergies  Aspartame and Daliresp  Home Medications   Current Outpatient Rx  Name  Route  Sig  Dispense  Refill  . Aclidinium Bromide (TUDORZA PRESSAIR) 400 MCG/ACT AEPB   Inhalation   Inhale 1 puff into the lungs 2 (two) times daily.         [name] ALPRAZolam (XANAX) 0.5 MG tablet   Oral   Take 0.5 mg by mouth at bedtime as needed. For sleep         . amLODipine (NORVASC) 10 MG tablet   Oral   Take 10 mg by mouth every morning.          . Cholecalciferol (VITAMIN D) 1000 UNITS capsule   Oral   Take 6,000 Units by mouth daily.          [name] CORAL CALCIUM PO   Oral   Take 1 tablet by mouth at bedtime. 1000mg          . Cyanocobalamin (VITAMIN B 12 PO)   Oral   Take 1 tablet by  mouth daily.          [name] gabapentin (NEURONTIN) 300 MG capsule   Oral   Take 300 mg by mouth at bedtime.         . hydrochlorothiazide 25 MG tablet   Oral   Take 25 mg by mouth daily with breakfast.          . metoprolol tartrate (LOPRESSOR) 25 MG tablet   Oral   Take 12.5 mg by mouth at bedtime.         . mometasone-formoterol (DULERA) 200-5 MCG/ACT AERO   Inhalation   Inhale 2 puffs into the lungs 2 (two) times daily.         . Multiple Vitamin (MULTIVITAMIN WITH MINERALS) TABS   Oral   Take 1 tablet by mouth daily.         [name] omeprazole (PRILOSEC) 20 MG capsule   Oral   Take 20 mg by mouth daily.         . ondansetron (ZOFRAN-ODT) 4 MG disintegrating tablet   Oral   Take 4 mg by mouth every 8 (eight) hours as needed for nausea.         [name] oxyCODONE-acetaminophen (PERCOCET/ROXICET) 5-325 MG per tablet   Oral   Take 1 tablet by mouth every 4 (four) hours as needed for pain.         . pantoprazole (PROTONIX) 40 MG tablet   Oral   Take 40 mg by mouth 2 (two) times daily.          BP 160/85  Pulse 93  Temp(Src) 98.3 F (36.8 C) (Axillary)  Resp 24  SpO2 99% Physical Exam  Nursing note and vitals reviewed.  77 year old female, resting comfortably and in no acute distress. Vital signs are significant for hypertension with blood pressure 160/85, and tachypnea with respiratory rate of 24. Oxygen saturation is 99%, which is normal. Head is normocephalic and atraumatic. PERRLA, EOMI. Oropharynx is clear. Neck is nontender and supple without adenopathy or JVD. Back is nontender and there is no CVA tenderness. Lungs are clear without rales, wheezes, or rhonchi. Chest is nontender. Heart has regular rate and rhythm without murmur. Abdomen is soft, flat, nontender without masses or hepatosplenomegaly and peristalsis is hypoactive. Extremities have no cyanosis or edema, full range of motion is present. Skin is warm and dry without rash.  Neurologic: Mental  status is normal, cranial nerves are intact, there are no motor or sensory deficits.   ED Course  Procedures (including critical care time) Labs Review Results for orders placed during the hospital encounter of 01/23/13  CBC WITH DIFFERENTIAL      Result Value Range   WBC 6.5  4.0 - 10.5 K/uL   RBC 4.68  3.87 - 5.11 MIL/uL   Hemoglobin 14.0  12.0 - 15.0 g/dL   HCT 16.1  09.6 - 04.5 %   MCV 84.0  78.0 - 100.0 fL   MCH 29.9  26.0 - 34.0 pg   MCHC 35.6  30.0 - 36.0 g/dL   RDW 40.9  81.1 - 91.4 %   Platelets 389  150 - 400 K/uL   Neutrophils Relative % 66  43 - 77 %   Neutro Abs 4.3  1.7 - 7.7 K/uL   Lymphocytes Relative 21  12 - 46 %   Lymphs Abs 1.4  0.7 - 4.0 K/uL   Monocytes Relative 10  3 - 12 %   Monocytes Absolute 0.7  0.1 - 1.0 K/uL   Eosinophils Relative 2  0 - 5 %   Eosinophils Absolute 0.1  0.0 - 0.7 K/uL   Basophils Relative 1  0 - 1 %   Basophils Absolute 0.0  0.0 - 0.1 K/uL  BASIC METABOLIC PANEL      Result Value Range   Sodium 136  135 - 145 mEq/L   Potassium 2.9 (*) 3.5 - 5.1 mEq/L   Chloride 97  96 - 112 mEq/L   CO2 23  19 - 32 mEq/L   Glucose, Bld 134 (*) 70 - 99 mg/dL   BUN 10  6 - 23 mg/dL   Creatinine, Ser 7.82  0.50 - 1.10 mg/dL   Calcium 95.6  8.4 - 21.3 mg/dL   GFR calc non Af Amer 80 (*) >90 mL/min   GFR calc Af Amer >90  >90 mL/min    MDM   1. Nausea & vomiting   2. Hypokalemia    Nausea and vomiting in a pattern most suggestive of a viral gastritis. No evidence on physical exam of more serious illness. She's been given a dose of ondansetron with slight improvement of nausea. She continues to have nausea but has not vomited. She will be given a dose of metoclopramide and reassessed. Potassium is come back 2.9 and she is given a dose of intravenous potassium.  Following intravenous metoclopramide, nausea significantly improved and she is tolerating oral fluids. She is sent home with a prescription for metoclopramide and is to followup with PCP on  an as-needed basis.    Dione Booze, MD 01/23/13 [phone]

## 2013-01-23 NOTE — ED Notes (Signed)
Pt states throwing up for four hours non stop

## 2013-02-06 ENCOUNTER — Telehealth: Payer: Self-pay | Admitting: Internal Medicine

## 2013-02-06 NOTE — Telephone Encounter (Signed)
S/W PT IN REF TO NP APPT. ON 02/19/13@1 :30 REFERRING DR MCKEOWN DX R/O MULTIPLE MYELOMA MAILED NP PACKET

## 2013-02-06 NOTE — Telephone Encounter (Signed)
C/D 02/06/13 for appt. 02/19/13

## 2013-02-11 ENCOUNTER — Other Ambulatory Visit: Payer: Self-pay

## 2013-02-11 ENCOUNTER — Emergency Department (HOSPITAL_COMMUNITY)
Admission: EM | Admit: 2013-02-11 | Discharge: 2013-02-11 | Disposition: A | Discharge status: Home / Self Care | Payer: Medicare Other | Attending: Emergency Medicine | Admitting: Emergency Medicine

## 2013-02-11 ENCOUNTER — Emergency Department (HOSPITAL_COMMUNITY): Payer: Medicare Other

## 2013-02-11 ENCOUNTER — Encounter (HOSPITAL_COMMUNITY): Payer: Self-pay | Admitting: *Deleted

## 2013-02-11 DIAGNOSIS — I10 Essential (primary) hypertension: Secondary | ICD-10-CM | POA: Insufficient documentation

## 2013-02-11 DIAGNOSIS — R630 Anorexia: Secondary | ICD-10-CM | POA: Insufficient documentation

## 2013-02-11 DIAGNOSIS — R109 Unspecified abdominal pain: Secondary | ICD-10-CM

## 2013-02-11 DIAGNOSIS — IMO0002 Reserved for concepts with insufficient information to code with codable children: Secondary | ICD-10-CM | POA: Insufficient documentation

## 2013-02-11 DIAGNOSIS — J45909 Unspecified asthma, uncomplicated: Secondary | ICD-10-CM | POA: Insufficient documentation

## 2013-02-11 DIAGNOSIS — Z8781 Personal history of (healed) traumatic fracture: Secondary | ICD-10-CM | POA: Insufficient documentation

## 2013-02-11 DIAGNOSIS — R112 Nausea with vomiting, unspecified: Secondary | ICD-10-CM | POA: Insufficient documentation

## 2013-02-11 DIAGNOSIS — Z8639 Personal history of other endocrine, nutritional and metabolic disease: Secondary | ICD-10-CM | POA: Insufficient documentation

## 2013-02-11 DIAGNOSIS — D509 Iron deficiency anemia, unspecified: Secondary | ICD-10-CM | POA: Insufficient documentation

## 2013-02-11 DIAGNOSIS — M545 Low back pain, unspecified: Secondary | ICD-10-CM | POA: Insufficient documentation

## 2013-02-11 DIAGNOSIS — Z862 Personal history of diseases of the blood and blood-forming organs and certain disorders involving the immune mechanism: Secondary | ICD-10-CM | POA: Insufficient documentation

## 2013-02-11 DIAGNOSIS — G8929 Other chronic pain: Secondary | ICD-10-CM | POA: Insufficient documentation

## 2013-02-11 DIAGNOSIS — R1013 Epigastric pain: Secondary | ICD-10-CM | POA: Insufficient documentation

## 2013-02-11 DIAGNOSIS — Z9861 Coronary angioplasty status: Secondary | ICD-10-CM | POA: Insufficient documentation

## 2013-02-11 DIAGNOSIS — K219 Gastro-esophageal reflux disease without esophagitis: Secondary | ICD-10-CM | POA: Insufficient documentation

## 2013-02-11 DIAGNOSIS — Z79899 Other long term (current) drug therapy: Secondary | ICD-10-CM | POA: Insufficient documentation

## 2013-02-11 DIAGNOSIS — I2510 Atherosclerotic heart disease of native coronary artery without angina pectoris: Secondary | ICD-10-CM | POA: Insufficient documentation

## 2013-02-11 LAB — CBC WITH DIFFERENTIAL/PLATELET
Basophils Relative: 1 % (ref 0–1)
Eosinophils Absolute: 0.1 10*3/uL (ref 0.0–0.7)
Eosinophils Relative: 1 % (ref 0–5)
Hemoglobin: 14.2 g/dL (ref 12.0–15.0)
MCH: 28.9 pg (ref 26.0–34.0)
MCHC: 33.8 g/dL (ref 30.0–36.0)
Monocytes Absolute: 0.6 10*3/uL (ref 0.1–1.0)
Monocytes Relative: 7 % (ref 3–12)
Neutrophils Relative %: 69 % (ref 43–77)

## 2013-02-11 LAB — COMPREHENSIVE METABOLIC PANEL
Albumin: 4.2 g/dL (ref 3.5–5.2)
BUN: 9 mg/dL (ref 6–23)
Calcium: 10 mg/dL (ref 8.4–10.5)
Creatinine, Ser: 0.95 mg/dL (ref 0.50–1.10)
Total Protein: 8.1 g/dL (ref 6.0–8.3)

## 2013-02-11 LAB — URINALYSIS, ROUTINE W REFLEX MICROSCOPIC
Glucose, UA: NEGATIVE mg/dL
Hgb urine dipstick: NEGATIVE
Ketones, ur: NEGATIVE mg/dL
Specific Gravity, Urine: 1.013 (ref 1.005–1.030)
Urobilinogen, UA: 0.2 mg/dL (ref 0.0–1.0)
pH: 7 (ref 5.0–8.0)

## 2013-02-11 LAB — POCT I-STAT TROPONIN I: Troponin i, poc: 0 ng/mL (ref 0.00–0.08)

## 2013-02-11 LAB — LIPASE, BLOOD: Lipase: 12 U/L (ref 11–59)

## 2013-02-11 MED ORDER — IOHEXOL 300 MG/ML  SOLN
25.0000 mL | INTRAMUSCULAR | Status: AC
  Administered 2013-02-11: 25 mL via ORAL

## 2013-02-11 MED ORDER — MORPHINE SULFATE 4 MG/ML IJ SOLN
4.0000 mg | Freq: Once | INTRAMUSCULAR | Status: AC
  Administered 2013-02-11: 4 mg via INTRAVENOUS
  Filled 2013-02-11: qty 1

## 2013-02-11 MED ORDER — METOCLOPRAMIDE HCL 5 MG/ML IJ SOLN
10.0000 mg | Freq: Once | INTRAMUSCULAR | Status: AC
  Administered 2013-02-11: 10 mg via INTRAVENOUS
  Filled 2013-02-11: qty 2

## 2013-02-11 MED ORDER — IOHEXOL 300 MG/ML  SOLN
100.0000 mL | Freq: Once | INTRAMUSCULAR | Status: AC | PRN
  Administered 2013-02-11: 100 mL via INTRAVENOUS

## 2013-02-11 MED ORDER — ONDANSETRON HCL 4 MG/2ML IJ SOLN
4.0000 mg | Freq: Once | INTRAMUSCULAR | Status: AC
  Administered 2013-02-11: 4 mg via INTRAVENOUS
  Filled 2013-02-11: qty 2

## 2013-02-11 MED ORDER — SODIUM CHLORIDE 0.9 % IV BOLUS (SEPSIS)
1000.0000 mL | Freq: Once | INTRAVENOUS | Status: AC
  Administered 2013-02-11: 1000 mL via INTRAVENOUS

## 2013-02-11 MED ORDER — METOCLOPRAMIDE HCL 10 MG PO TABS: 10.0000 mg | ORAL_TABLET | Freq: Four times a day (QID) | ORAL | Status: DC | PRN

## 2013-02-11 NOTE — ED Notes (Signed)
MD at bedside. 

## 2013-02-11 NOTE — ED Provider Notes (Signed)
CSN: [id number]     Arrival date & time 02/11/13  0813 History   First MD Initiated Contact with Patient 02/11/13 0815     Chief Complaint  Patient presents with  . Emesis   (Consider location/radiation/quality/duration/timing/severity/associated sxs/prior Treatment) HPI  77 year old female with history of CAD, hypertension, GERD, trouble swallowing, hiatal hernia presents complaining of vomiting.  History was obtained through patient and through husband was at bedside. Per husband, patient has several recent bony fractures of the low back and her right hip that is nontraumatic within the past several months. She has been evaluated by an orthopedic, Dr. Otelia Sergeant. There is concern for pathologic fracture and patient was recently recommended to followup with an oncologist Dr. Marlena Clipper.  The news of possible cancer has upset the patient according to her husband.  She has been eating less and drinking less throughout the weekend and this morning she begins develops increased nausea and has vomited several times. Vomitus is nonbloody nonbilious. No diarrhea. Her husband was afraid of patient becoming dehydrated and brought patient to ER for further evaluation. Patient currently complaining of mild abdominal pain. Pain is midabdomen, nonradiating, 3 out 10, vomiting makes it worse. She also complaining of pain to her low back and her right hip but this is chronic since her recent fracture. Otherwise she denies fever, chills, headache, neck pain, chest pain, shortness of breath, productive cough, hemoptysis, dysuria, hematuria, melena, or hematochezia. No evidence of abnormal weight changes, or night sweats.  Past Medical History  Diagnosis Date  . CAD (coronary artery disease)     LHC 2003 with 40% pLAD, 30% mLAD, 100% D1 (moderate vessel), 100%  D2 (small vessel), 95% mid small OM2.  Pt had PTCA to D1;  D2 and OM2 small vessels and tx medically;  Last Myoview (5/09):  EF 66%, anteroseptal/apical infarct  with minimal peri-infarct ischemia.  No significant change from 11/06. Pt managed medically  . Recurrent aspiration bronchitis/pneumonia     PFTs 12/09 FVC 97%, FEV1 101%, ratio 77%, TLC 109%. No significant obstruction or restriction.   . Hypertension   . Hyperlipidemia   . Esophageal stricture   . Diverticulosis of colon   . Gastroesophageal reflux disease with hiatal hernia   . Iron deficiency anemia   . Gastritis   . GERD (gastroesophageal reflux disease)   . Hiatal hernia   . Asthma   . Blood transfusion   . Cough   . Wheezing   . Weakness   . Trouble swallowing   . Abdominal pain   . Hiatal hernia   . Shortness of breath    Past Surgical History  Procedure Laterality Date  . Back surgery    . Cholecystectomy    . Bladder repair    . Hemorrhoid surgery    . Cholecystectomy      Patient unsure of date.  [name] Appendectomy      patient unsure of date  . Total abdominal hysterectomy  1975    partial  . Coronary angioplasty  11/16/2001  . Eye surgery  11/2000    bilateral cataracts with lens implant  . Hiatal hernia repair  06/03/2011    Procedure: LAPAROSCOPIC REPAIR OF HIATAL HERNIA;  Surgeon: Ernestene Mention, MD;  Location: [facility];  Service: General;  Laterality: N/A;  . Laparoscopic nissen fundoplication  06/03/2011    Procedure: LAPAROSCOPIC NISSEN FUNDOPLICATION;  Surgeon: Ernestene Mention, MD;  Location: [facility];  Service: General;  Laterality: N/A;  . Lumbar laminectomy/decompression microdiscectomy  Right 06/26/2012    Procedure: LUMBAR LAMINECTOMY/DECOMPRESSION MICRODISCECTOMY;  Surgeon: Kerrin Champagne, MD;  Location: [facility] OR;  Service: Orthopedics;  Laterality: Right;  RIGHT L4-5 MICRODISCECTOMY   Family History  Problem Relation Age of Onset  . Cancer Mother     colon   . Colon cancer Mother 40  . Heart disease Father 3    heart attack   History  Substance Use Topics  . Smoking status: Never Smoker   . Smokeless tobacco: Never Used  . Alcohol Use: No   OB  History   Grav Para Term Preterm Abortions TAB SAB Ect Mult Living                 Review of Systems  All other systems reviewed and are negative.    Allergies  Aspartame and Daliresp  Home Medications   Current Outpatient Rx  Name  Route  Sig  Dispense  Refill  . Aclidinium Bromide (TUDORZA PRESSAIR) 400 MCG/ACT AEPB   Inhalation   Inhale 1 puff into the lungs 2 (two) times daily.         [name] ALPRAZolam (XANAX) 0.5 MG tablet   Oral   Take 0.5 mg by mouth at bedtime as needed. For sleep         . amLODipine (NORVASC) 10 MG tablet   Oral   Take 10 mg by mouth every morning.          . Cholecalciferol (VITAMIN D) 1000 UNITS capsule   Oral   Take 6,000 Units by mouth daily.          [name] CORAL CALCIUM PO   Oral   Take 1 tablet by mouth at bedtime. 1000mg          . Cyanocobalamin (VITAMIN B 12 PO)   Oral   Take 1 tablet by mouth daily.          [name] gabapentin (NEURONTIN) 300 MG capsule   Oral   Take 300 mg by mouth at bedtime.         . hydrochlorothiazide 25 MG tablet   Oral   Take 25 mg by mouth daily with breakfast.          . metoCLOPramide (REGLAN) 10 MG tablet   Oral   Take 1 tablet (10 mg total) by mouth every 6 (six) hours as needed (Nausea).   30 tablet   0   . metoprolol tartrate (LOPRESSOR) 25 MG tablet   Oral   Take 12.5 mg by mouth at bedtime.         . mometasone-formoterol (DULERA) 200-5 MCG/ACT AERO   Inhalation   Inhale 2 puffs into the lungs 2 (two) times daily.         . Multiple Vitamin (MULTIVITAMIN WITH MINERALS) TABS   Oral   Take 1 tablet by mouth daily.         [name] omeprazole (PRILOSEC) 20 MG capsule   Oral   Take 20 mg by mouth daily.         . ondansetron (ZOFRAN-ODT) 4 MG disintegrating tablet   Oral   Take 4 mg by mouth every 8 (eight) hours as needed for nausea.         [name] oxyCODONE-acetaminophen (PERCOCET/ROXICET) 5-325 MG per tablet   Oral   Take 1 tablet by mouth every 4 (four) hours as needed  for pain.         . pantoprazole (PROTONIX) 40 MG tablet   Oral  Take 40 mg by mouth 2 (two) times daily.          There were no vitals taken for this visit. Physical Exam  Nursing note and vitals reviewed. Constitutional: She is oriented to person, place, and time. She appears well-developed and well-nourished. No distress.  Awake, alert, nontoxic appearance  HENT:  Head: Atraumatic.  Mouth/Throat: Oropharynx is clear and moist.  Eyes: Conjunctivae and EOM are normal. Pupils are equal, round, and reactive to light. Right eye exhibits no discharge. Left eye exhibits no discharge.  Neck: Neck supple.  Cardiovascular: Normal rate and regular rhythm.   Pulmonary/Chest: Effort normal. No respiratory distress. She exhibits no tenderness.  Abdominal: Soft. There is tenderness (Mild epigastric tenderness on palpation without guarding or rebound tenderness. No overlying skin changes.). There is no rebound and no guarding.  Musculoskeletal: She exhibits no tenderness (Tenderness to right anterior hip on palpation with decreased range of motion. Tenderness to the lumbar spine without crepitus or step-off.).  ROM appears intact, no obvious focal weakness  Neurological: She is alert and oriented to person, place, and time.  Mental status and motor strength appears intact  Skin: No rash noted.  Psychiatric: She has a normal mood and affect.    ED Course  Procedures (including critical care time)   Date: 02/11/2013  Rate: 98  Rhythm: normal sinus rhythm  QRS Axis: normal  Intervals: normal  ST/T Wave abnormalities: nonspecific ST/T changes  Conduction Disutrbances:none  Narrative Interpretation:   Old EKG Reviewed: unchanged    8:37 AM Pt here with abd pain with n/v after she was recommended to f/u with oncologist for further evaluation of ?pathologic fx.  Since she has abd pain with n/v, will obtain abd/pelvis CT for further evaluation.  Work up initiated.  IVF, antinausea, and  pain medication given.    12:15 PM Abdominal and pelvis CT scan shows no acute finding. Evidence of lumbar compression fractures and sacral fractures were noted as seen on prior MRI. Patient continues to endorse nausea and unable to tolerates by mouth, we'll continue to monitor and control her symptoms. Otherwise her labs are reassuring. UA is negative, troponin is unremarkable, normal H&H, no leukocytosis, electrolytes are reassuring, and normal lipase.  1:50 PM Patient felt better. She is able to ambulate and able to tolerates by mouth.  Labs Review Labs Reviewed  CBC WITH DIFFERENTIAL - Abnormal; Notable for the following:    Platelets 493 (*)    All other components within normal limits  COMPREHENSIVE METABOLIC PANEL - Abnormal; Notable for the following:    Glucose, Bld 147 (*)    GFR calc non Af Amer 57 (*)    GFR calc Af Amer 66 (*)    All other components within normal limits  LIPASE, BLOOD  URINALYSIS, ROUTINE W REFLEX MICROSCOPIC  POCT I-STAT TROPONIN I   Imaging Review No results found.  MDM   1. Abdominal pain   2. Nausea & vomiting    BP 143/71  Pulse 89  Temp(Src) 98 F (36.7 C) (Oral)  Resp 20  SpO2 96%  I have reviewed nursing notes and vital signs. I personally reviewed the imaging tests through PACS system  I reviewed available ER/hospitalization records thought the EMR     Fayrene Helper, [place] 02/15/13 8119

## 2013-02-11 NOTE — ED Notes (Signed)
Pt tolerating PO with no vomiting 

## 2013-02-11 NOTE — ED Notes (Signed)
Pt is here with vomiting that started this am and states she does have some upper abdominal soreness/pain.

## 2013-02-11 NOTE — ED Notes (Signed)
Patient ambulated in hallway to bathroom and back.  Stated she was fine, felt much better.

## 2013-02-12 ENCOUNTER — Emergency Department (HOSPITAL_COMMUNITY): Payer: Medicare Other

## 2013-02-12 ENCOUNTER — Encounter (HOSPITAL_COMMUNITY): Payer: Self-pay | Admitting: Emergency Medicine

## 2013-02-12 ENCOUNTER — Emergency Department (HOSPITAL_COMMUNITY)
Admission: EM | Admit: 2013-02-12 | Discharge: 2013-02-12 | Disposition: A | Discharge status: Home / Self Care | Payer: Medicare Other | Attending: Emergency Medicine | Admitting: Emergency Medicine

## 2013-02-12 DIAGNOSIS — Z9861 Coronary angioplasty status: Secondary | ICD-10-CM | POA: Insufficient documentation

## 2013-02-12 DIAGNOSIS — Z860100 Personal history of colon polyps, unspecified: Secondary | ICD-10-CM | POA: Insufficient documentation

## 2013-02-12 DIAGNOSIS — Z8639 Personal history of other endocrine, nutritional and metabolic disease: Secondary | ICD-10-CM | POA: Insufficient documentation

## 2013-02-12 DIAGNOSIS — Z862 Personal history of diseases of the blood and blood-forming organs and certain disorders involving the immune mechanism: Secondary | ICD-10-CM | POA: Insufficient documentation

## 2013-02-12 DIAGNOSIS — J45901 Unspecified asthma with (acute) exacerbation: Secondary | ICD-10-CM | POA: Insufficient documentation

## 2013-02-12 DIAGNOSIS — R112 Nausea with vomiting, unspecified: Secondary | ICD-10-CM | POA: Insufficient documentation

## 2013-02-12 DIAGNOSIS — K219 Gastro-esophageal reflux disease without esophagitis: Secondary | ICD-10-CM | POA: Insufficient documentation

## 2013-02-12 DIAGNOSIS — Z79899 Other long term (current) drug therapy: Secondary | ICD-10-CM | POA: Insufficient documentation

## 2013-02-12 DIAGNOSIS — I10 Essential (primary) hypertension: Secondary | ICD-10-CM | POA: Insufficient documentation

## 2013-02-12 DIAGNOSIS — I2510 Atherosclerotic heart disease of native coronary artery without angina pectoris: Secondary | ICD-10-CM | POA: Insufficient documentation

## 2013-02-12 LAB — COMPREHENSIVE METABOLIC PANEL
AST: 19 U/L (ref 0–37)
Albumin: 4.1 g/dL (ref 3.5–5.2)
Alkaline Phosphatase: 92 U/L (ref 39–117)
BUN: 9 mg/dL (ref 6–23)
Calcium: 9.8 mg/dL (ref 8.4–10.5)
Chloride: 102 mEq/L (ref 96–112)
Creatinine, Ser: 0.85 mg/dL (ref 0.50–1.10)
GFR calc Af Amer: 76 mL/min — ABNORMAL LOW (ref >90)
Potassium: 3.4 mEq/L — ABNORMAL LOW (ref 3.5–5.1)
Total Bilirubin: 0.5 mg/dL (ref 0.3–1.2)

## 2013-02-12 LAB — CBC WITH DIFFERENTIAL/PLATELET
Basophils Absolute: 0 10*3/uL (ref 0.0–0.1)
Basophils Relative: 0 % (ref 0–1)
Eosinophils Absolute: 0.1 10*3/uL (ref 0.0–0.7)
HCT: 40.8 % (ref 36.0–46.0)
Hemoglobin: 13.5 g/dL (ref 12.0–15.0)
MCHC: 33.1 g/dL (ref 30.0–36.0)
MCV: 86.1 fL (ref 78.0–100.0)
Monocytes Absolute: 0.6 10*3/uL (ref 0.1–1.0)
Monocytes Relative: 7 % (ref 3–12)
Neutro Abs: 6.7 10*3/uL (ref 1.7–7.7)
Neutrophils Relative %: 73 % (ref 43–77)
WBC: 9.1 10*3/uL (ref 4.0–10.5)

## 2013-02-12 LAB — CG4 I-STAT (LACTIC ACID): Lactic Acid, Venous: 2.45 mmol/L — ABNORMAL HIGH (ref 0.5–2.2)

## 2013-02-12 LAB — URINALYSIS, ROUTINE W REFLEX MICROSCOPIC
Bilirubin Urine: NEGATIVE
Glucose, UA: NEGATIVE mg/dL
Ketones, ur: NEGATIVE mg/dL
pH: 6 (ref 5.0–8.0)

## 2013-02-12 LAB — POCT I-STAT TROPONIN I

## 2013-02-12 MED ORDER — SODIUM CHLORIDE 0.9 % IV BOLUS (SEPSIS)
500.0000 mL | Freq: Once | INTRAVENOUS | Status: AC
  Administered 2013-02-12: 14:00:00 via INTRAVENOUS

## 2013-02-12 MED ORDER — IOHEXOL 350 MG/ML SOLN
100.0000 mL | Freq: Once | INTRAVENOUS | Status: AC | PRN
  Administered 2013-02-12: 100 mL via INTRAVENOUS

## 2013-02-12 MED ORDER — ONDANSETRON HCL 4 MG/2ML IJ SOLN
4.0000 mg | Freq: Once | INTRAMUSCULAR | Status: AC
  Administered 2013-02-12: 4 mg via INTRAVENOUS
  Filled 2013-02-12: qty 2

## 2013-02-12 MED ORDER — LORAZEPAM 1 MG PO TABS
1.0000 mg | ORAL_TABLET | Freq: Once | ORAL | Status: AC
  Administered 2013-02-12: 1 mg via ORAL
  Filled 2013-02-12: qty 1

## 2013-02-12 MED ORDER — SODIUM CHLORIDE 0.9 % IV SOLN
Freq: Once | INTRAVENOUS | Status: AC
  Administered 2013-02-12: 13:00:00 via INTRAVENOUS

## 2013-02-12 MED ORDER — METOCLOPRAMIDE HCL 5 MG/ML IJ SOLN
5.0000 mg | Freq: Once | INTRAMUSCULAR | Status: AC
  Administered 2013-02-12: 5 mg via INTRAVENOUS
  Filled 2013-02-12: qty 2

## 2013-02-12 MED ORDER — PROMETHAZINE HCL 12.5 MG PO TABS: 12.5000 mg | ORAL_TABLET | Freq: Four times a day (QID) | ORAL | Status: DC | PRN

## 2013-02-12 MED ORDER — PROMETHAZINE HCL 12.5 MG RE SUPP: 12.5000 mg | Freq: Four times a day (QID) | RECTAL | Status: DC | PRN

## 2013-02-12 MED ORDER — HYDROMORPHONE HCL PF 1 MG/ML IJ SOLN
0.5000 mg | Freq: Once | INTRAMUSCULAR | Status: AC
  Administered 2013-02-12: 0.5 mg via INTRAVENOUS
  Filled 2013-02-12: qty 1

## 2013-02-12 MED ORDER — MORPHINE SULFATE 4 MG/ML IJ SOLN
4.0000 mg | Freq: Once | INTRAMUSCULAR | Status: AC
  Administered 2013-02-12: 4 mg via INTRAVENOUS
  Filled 2013-02-12: qty 1

## 2013-02-12 NOTE — ED Notes (Signed)
Helped patient to the restroom.

## 2013-02-12 NOTE — ED Notes (Signed)
Introduced myself to patient, she is resting.

## 2013-02-12 NOTE — ED Provider Notes (Signed)
Pt states she had multiple episodes of vomiting and dry heaves yesterday without diarrhea and today has upper abdominal pain today. Pt's abdomen is soft without guarding or rebound. Pt tearful talking to the nurses about a murder of children in her family a few years ago.   Medical screening examination/treatment/procedure(s) were conducted as a shared visit with non-physician practitioner(s) and myself.  I personally evaluated the patient during the encounter   Devoria Albe, MD, Franz Dell, MD 02/12/13 [phone]

## 2013-02-12 NOTE — ED Notes (Addendum)
Onset yesterday 1am vomiting, seen in ED yesterday, no vomiting 3am. Nausea is no better.  Upper abd pain, tender to touch.

## 2013-02-12 NOTE — ED Notes (Signed)
Contacted CT, and advised them the patient had a new PIV#20 to the Left/AC placed using [place] by Lemont Fillers, K., PA.

## 2013-02-12 NOTE — ED Provider Notes (Signed)
See prior note   Ward Givens, MD 02/12/13 2054

## 2013-02-12 NOTE — ED Notes (Signed)
Patient transported to CT 

## 2013-02-12 NOTE — ED Notes (Signed)
Patient back from CT.

## 2013-02-12 NOTE — ED Notes (Signed)
Patient is resting comfortably, with family at bedside.

## 2013-02-12 NOTE — ED Notes (Signed)
Pt c/o mid abd pain with N/V; pt seen here yesterday for same and diagnosed with virus; pt sts no better

## 2013-02-12 NOTE — ED Notes (Signed)
CT called and needs a #20 IV in.

## 2013-02-12 NOTE — ED Provider Notes (Signed)
CSN: [id number]     Arrival date & time 02/12/13  1107 History   First MD Initiated Contact with Patient 02/12/13 1206     Chief Complaint  Patient presents with  . Abdominal Pain  . Emesis   (Consider location/radiation/quality/duration/timing/severity/associated sxs/prior Treatment) HPI [name] [name] is a 77 y.o. female who presents to emergency department with complaint of abdominal pain, nausea, vomiting. States symptoms began 3 days ago. States has been vomiting constantly for 2 days. States was seen here for the same, was told blood work and CT abdomen and pelvis was normal. She was hydrated yesterday in emergency department, he felt better, was able to tolerate by mouth fluids. States was able to go home and felt okay last night. States this morning began having recurrent abdominal pain, vomiting. She tried to take Reglan for her nausea but her nausea and vomiting did not improve. States she's feeling weak and dehydrated. Patient denies any chest pain, shortness of breath, back pain. She denies any urinary symptoms. Denies any recent travel or sick contacts.  Past Medical History  Diagnosis Date  . CAD (coronary artery disease)     LHC 2003 with 40% pLAD, 30% mLAD, 100% D1 (moderate vessel), 100%  D2 (small vessel), 95% mid small OM2.  Pt had PTCA to D1;  D2 and OM2 small vessels and tx medically;  Last Myoview (5/09):  EF 66%, anteroseptal/apical infarct with minimal peri-infarct ischemia.  No significant change from 11/06. Pt managed medically  . Recurrent aspiration bronchitis/pneumonia     PFTs 12/09 FVC 97%, FEV1 101%, ratio 77%, TLC 109%. No significant obstruction or restriction.   . Hypertension   . Hyperlipidemia   . Esophageal stricture   . Diverticulosis of colon   . Gastroesophageal reflux disease with hiatal hernia   . Iron deficiency anemia   . Gastritis   . GERD (gastroesophageal reflux disease)   . Hiatal hernia   . Asthma   . Blood transfusion   . Cough   .  Wheezing   . Weakness   . Trouble swallowing   . Abdominal pain   . Hiatal hernia   . Shortness of breath    Past Surgical History  Procedure Laterality Date  . Back surgery    . Cholecystectomy    . Bladder repair    . Hemorrhoid surgery    . Cholecystectomy      Patient unsure of date.  [name] Appendectomy      patient unsure of date  . Total abdominal hysterectomy  1975    partial  . Coronary angioplasty  11/16/2001  . Eye surgery  11/2000    bilateral cataracts with lens implant  . Hiatal hernia repair  06/03/2011    Procedure: LAPAROSCOPIC REPAIR OF HIATAL HERNIA;  Surgeon: Ernestene Mention, MD;  Location: [facility];  Service: General;  Laterality: N/A;  . Laparoscopic nissen fundoplication  06/03/2011    Procedure: LAPAROSCOPIC NISSEN FUNDOPLICATION;  Surgeon: Ernestene Mention, MD;  Location: [facility];  Service: General;  Laterality: N/A;  . Lumbar laminectomy/decompression microdiscectomy Right 06/26/2012    Procedure: LUMBAR LAMINECTOMY/DECOMPRESSION MICRODISCECTOMY;  Surgeon: Kerrin Champagne, MD;  Location: [facility] OR;  Service: Orthopedics;  Laterality: Right;  RIGHT L4-5 MICRODISCECTOMY   Family History  Problem Relation Age of Onset  . Cancer Mother     colon   . Colon cancer Mother 46  . Heart disease Father 46    heart attack   History  Substance Use Topics  .  Smoking status: Never Smoker   . Smokeless tobacco: Never Used  . Alcohol Use: No   OB History   Grav Para Term Preterm Abortions TAB SAB Ect Mult Living                 Review of Systems  Constitutional: Negative for fever and chills.  HENT: Negative for neck pain and neck stiffness.   Respiratory: Negative for cough, chest tightness and shortness of breath.   Cardiovascular: Negative for chest pain, palpitations and leg swelling.  Gastrointestinal: Positive for nausea, vomiting and abdominal pain. Negative for diarrhea.  Genitourinary: Negative for dysuria, flank pain and pelvic pain.  Musculoskeletal:  Negative for myalgias and arthralgias.  Skin: Negative for rash.  Neurological: Positive for weakness. Negative for dizziness and headaches.  All other systems reviewed and are negative.    Allergies  Aspartame and Daliresp  Home Medications   Current Outpatient Rx  Name  Route  Sig  Dispense  Refill  . Aclidinium Bromide (TUDORZA PRESSAIR) 400 MCG/ACT AEPB   Inhalation   Inhale 1 puff into the lungs 2 (two) times daily.         [name] ALPRAZolam (XANAX) 0.5 MG tablet   Oral   Take 0.5 mg by mouth at bedtime as needed. For sleep         . amLODipine (NORVASC) 10 MG tablet   Oral   Take 10 mg by mouth every morning.          . Cholecalciferol (VITAMIN D) 1000 UNITS capsule   Oral   Take 6,000 Units by mouth daily.          [name] CORAL CALCIUM PO   Oral   Take 1 tablet by mouth at bedtime. 1000mg          . Cyanocobalamin (VITAMIN B 12 PO)   Oral   Take 1 tablet by mouth daily.          [name] gabapentin (NEURONTIN) 300 MG capsule   Oral   Take 300 mg by mouth at bedtime.         . hydrochlorothiazide 25 MG tablet   Oral   Take 25 mg by mouth daily with breakfast.          . metoCLOPramide (REGLAN) 10 MG tablet   Oral   Take 1 tablet (10 mg total) by mouth every 6 (six) hours as needed (Nausea).   20 tablet   0   . metoprolol tartrate (LOPRESSOR) 25 MG tablet   Oral   Take 12.5 mg by mouth at bedtime.         . mometasone-formoterol (DULERA) 200-5 MCG/ACT AERO   Inhalation   Inhale 2 puffs into the lungs 2 (two) times daily.         . Multiple Vitamin (MULTIVITAMIN WITH MINERALS) TABS   Oral   Take 1 tablet by mouth daily.         [name] omeprazole (PRILOSEC) 20 MG capsule   Oral   Take 20 mg by mouth daily.         . ondansetron (ZOFRAN-ODT) 4 MG disintegrating tablet   Oral   Take 4 mg by mouth every 8 (eight) hours as needed for nausea.         . pantoprazole (PROTONIX) 40 MG tablet   Oral   Take 40 mg by mouth 2 (two) times daily.           BP 146/82  Pulse 101  Temp(Src) 97.9 F (36.6 C) (Oral)  Resp 16  Wt 162 lb 8 oz (73.71 kg)  BMI 27.04 kg/m2  SpO2 97% Physical Exam  ED Course  Procedures (including critical care time) Labs Review Labs Reviewed  CBC WITH DIFFERENTIAL  COMPREHENSIVE METABOLIC PANEL  LIPASE, BLOOD  URINALYSIS, ROUTINE W REFLEX MICROSCOPIC   Imaging Review Ct Abdomen Pelvis W Contrast  02/11/2013   CLINICAL DATA:  Nausea and vomiting. Abdominal pain and soreness. Chills. Right hip pain.  EXAM: CT ABDOMEN AND PELVIS WITH CONTRAST  TECHNIQUE: Multidetector CT imaging of the abdomen and pelvis was performed using the standard protocol following bolus administration of intravenous contrast.  CONTRAST:  OMNIPAQUE IOHEXOL 300 MG/ML  SOLN  COMPARISON:  CT abdomen and pelvis 06/20/2012. MRI lumbar spine 12/11/2012. MRI right hip 0 8/0 30/2014.  FINDINGS: There is some dependent atelectasis in lung bases. The patient is status post Nissen fundoplication with a small hiatal hernia unchanged. No pleural or pericardial effusion.  The patient is status post cholecystectomy. The liver, biliary tree, adrenal glands, spleen, pancreas and right kidney all appear normal. A 0.6 cm low attenuating lesion in the lower pole left kidney is compatible with a cyst.  The patient is status post hysterectomy. Urinary bladder is unremarkable. The stomach and small and large bowel appear normal. There is no lymphadenopathy or fluid.  Bones demonstrate compression fractures of L1, L2 and L3 as seen on the comparison MRI. Bilateral sacral a ala fractures are also identified as seen on MRI. Avascular necrosis right femoral head and right parasymphyseal pubic bone fracture as seen on prior MRI.  IMPRESSION: No acute finding.  No change in a hiatal hernia with the patient status post fundoplication.  L1, L2 and L3 compression fractures, bilateral sacral ala fractures and right parasymphyseal pubic bone fracture as seen on recent  MRI scans.   Electronically Signed   By: Drusilla Kanner M.D.   On: 02/11/2013 11:51    Date: 02/12/2013  Rate: 84  Rhythm: normal sinus rhythm  QRS Axis: normal  Intervals: normal  ST/T Wave abnormalities: nonspecific T wave changes  Conduction Disutrbances:none  Narrative Interpretation:   Old EKG Reviewed: unchanged    MDM   1. Nausea & vomiting      Pt with continued abdominal pain, nausea, vomiting. Normal bowel movements. Was here for the same yesterday. Will get labs.   1:45 PM Pt's Labs unremarkable other than elevated lactic acid of 2.45. Concerning for mesenteric ischemia. Will get CT angio abd/pelvis.   [name]6:00 PM Delay in CT, no 20 gauge IV.   I used bedside US to obtain an  IV access in left decubital fossa.   Abran Duke insertion using bedside US Performed by: Jaynie Crumble A  Consent: Verbal consent obtained. Risks and benefits: risks, benefits and alternatives were discussed Time out: Immediately prior to procedure a "time out" was called to verify the correct patient, procedure, equipment, support staff and site/side marked as required.  Preparation: Patient was prepped and draped in the usual sterile fashion.  Vein Location: left antecubital fossa  Ultrasound Guided  Gauge: 20  Normal blood return and flush without difficulty Patient tolerance: Patient tolerated the procedure well with no immediate complications.  8:24 PM PT's CT negative. She is feeling better, but continues to complain of nausea. Abdomen is soft. Will do PO trial.   8:48 PM Pt feeling better. Tolerating pos. Asking for different nausea medication. States unable to afford zofran. reglan not working. Will try  low dose phenergan.   Filed Vitals:   02/12/13 1800 02/12/13 1815 02/12/13 1830 02/12/13 1900  BP: 138/72 143/71 149/74 160/70  Pulse: 89 86 92 63  Temp:      TempSrc:      Resp: 12 16 12 16   Weight:      SpO2: 98% 97% 99% 98%     Myriam Jacobson Rehan Holness,  PA-C 02/12/13 2052

## 2013-02-12 NOTE — ED Notes (Signed)
Patient is alert and orientedx4.  Patient was explained discharge instructions and they understood them with no questions.  The patient's husband, Aibhlinn Kalmar is taking her home.

## 2013-02-14 NOTE — ED Provider Notes (Signed)
CSN: [id number]     Arrival date & time 02/12/13  1107 History   First MD Initiated Contact with Patient 02/11/13 0815     Chief Complaint  Patient presents with  . Abdominal Pain  . Emesis   (Consider location/radiation/quality/duration/timing/severity/associated sxs/prior Treatment) Abdominal Pain Associated symptoms: vomiting   Emesis Associated symptoms: abdominal pain   Patient is a 77 y.o. female presenting with abdominal pain and vomiting.    77 year old female with history of CAD, hypertension, GERD, trouble swallowing, hiatal hernia presents complaining of vomiting.  History was obtained through patient and through husband was at bedside. Per husband, patient has several recent bony fractures of the low back and her right hip that is nontraumatic within the past several months. She has been evaluated by an orthopedic, Dr. Otelia Sergeant. There is concern for pathologic fracture and patient was recently recommended to followup with an oncologist Dr. Marlena Clipper.  The news of possible cancer has upset the patient according to her husband.  She has been eating less and drinking less throughout the weekend and this morning she begins develops increased nausea and has vomited several times. Vomitus is nonbloody nonbilious. No diarrhea. Her husband was afraid of patient becoming dehydrated and brought patient to ER for further evaluation. Patient currently complaining of mild abdominal pain. Pain is midabdomen, nonradiating, 3 out 10, vomiting makes it worse. She also complaining of pain to her low back and her right hip but this is chronic since her recent fracture. Otherwise she denies fever, chills, headache, neck pain, chest pain, shortness of breath, productive cough, hemoptysis, dysuria, hematuria, melena, or hematochezia. No evidence of abnormal weight changes, or night sweats.  Past Medical History  Diagnosis Date  . CAD (coronary artery disease)     LHC 2003 with 40% pLAD, 30% mLAD, 100% D1  (moderate vessel), 100%  D2 (small vessel), 95% mid small OM2.  Pt had PTCA to D1;  D2 and OM2 small vessels and tx medically;  Last Myoview (5/09):  EF 66%, anteroseptal/apical infarct with minimal peri-infarct ischemia.  No significant change from 11/06. Pt managed medically  . Recurrent aspiration bronchitis/pneumonia     PFTs 12/09 FVC 97%, FEV1 101%, ratio 77%, TLC 109%. No significant obstruction or restriction.   . Hypertension   . Hyperlipidemia   . Esophageal stricture   . Diverticulosis of colon   . Gastroesophageal reflux disease with hiatal hernia   . Iron deficiency anemia   . Gastritis   . GERD (gastroesophageal reflux disease)   . Hiatal hernia   . Asthma   . Blood transfusion   . Cough   . Wheezing   . Weakness   . Trouble swallowing   . Abdominal pain   . Hiatal hernia   . Shortness of breath    Past Surgical History  Procedure Laterality Date  . Back surgery    . Cholecystectomy    . Bladder repair    . Hemorrhoid surgery    . Cholecystectomy      Patient unsure of date.  [name] Appendectomy      patient unsure of date  . Total abdominal hysterectomy  1975    partial  . Coronary angioplasty  11/16/2001  . Eye surgery  11/2000    bilateral cataracts with lens implant  . Hiatal hernia repair  06/03/2011    Procedure: LAPAROSCOPIC REPAIR OF HIATAL HERNIA;  Surgeon: Ernestene Mention, MD;  Location: [facility];  Service: General;  Laterality: N/A;  . Laparoscopic nissen  fundoplication  06/03/2011    Procedure: LAPAROSCOPIC NISSEN FUNDOPLICATION;  Surgeon: Ernestene Mention, MD;  Location: [facility];  Service: General;  Laterality: N/A;  . Lumbar laminectomy/decompression microdiscectomy Right 06/26/2012    Procedure: LUMBAR LAMINECTOMY/DECOMPRESSION MICRODISCECTOMY;  Surgeon: Kerrin Champagne, MD;  Location: [facility] OR;  Service: Orthopedics;  Laterality: Right;  RIGHT L4-5 MICRODISCECTOMY   Family History  Problem Relation Age of Onset  . Cancer Mother     colon   . Colon cancer  Mother 24  . Heart disease Father 6    heart attack   History  Substance Use Topics  . Smoking status: Never Smoker   . Smokeless tobacco: Never Used  . Alcohol Use: No   OB History   Grav Para Term Preterm Abortions TAB SAB Ect Mult Living                 Review of Systems  Gastrointestinal: Positive for vomiting and abdominal pain.  All other systems reviewed and are negative.    Allergies  Aspartame and Daliresp  Home Medications   Current Outpatient Rx  Name  Route  Sig  Dispense  Refill  . Aclidinium Bromide (TUDORZA PRESSAIR) 400 MCG/ACT AEPB   Inhalation   Inhale 1 puff into the lungs 2 (two) times daily.         [name] ALPRAZolam (XANAX) 0.5 MG tablet   Oral   Take 0.5 mg by mouth at bedtime as needed. For sleep         . amLODipine (NORVASC) 10 MG tablet   Oral   Take 10 mg by mouth every morning.          . Cholecalciferol (VITAMIN D) 1000 UNITS capsule   Oral   Take 6,000 Units by mouth daily.          [name] CORAL CALCIUM PO   Oral   Take 1 tablet by mouth at bedtime. 1000mg          . Cyanocobalamin (VITAMIN B 12 PO)   Oral   Take 1 tablet by mouth daily.          [name] gabapentin (NEURONTIN) 300 MG capsule   Oral   Take 300 mg by mouth at bedtime.         . hydrochlorothiazide 25 MG tablet   Oral   Take 25 mg by mouth daily with breakfast.          . metoCLOPramide (REGLAN) 10 MG tablet   Oral   Take 1 tablet (10 mg total) by mouth every 6 (six) hours as needed (Nausea).   20 tablet   0   . metoprolol tartrate (LOPRESSOR) 25 MG tablet   Oral   Take 12.5 mg by mouth at bedtime.         . mometasone-formoterol (DULERA) 200-5 MCG/ACT AERO   Inhalation   Inhale 2 puffs into the lungs 2 (two) times daily.         . Multiple Vitamin (MULTIVITAMIN WITH MINERALS) TABS   Oral   Take 1 tablet by mouth daily.         [name] omeprazole (PRILOSEC) 20 MG capsule   Oral   Take 20 mg by mouth daily.         . ondansetron  (ZOFRAN-ODT) 4 MG disintegrating tablet   Oral   Take 4 mg by mouth every 8 (eight) hours as needed for nausea.         . pantoprazole (PROTONIX) 40  MG tablet   Oral   Take 40 mg by mouth 2 (two) times daily.         . promethazine (PHENERGAN) 12.5 MG suppository   Rectal   Place 1 suppository (12.5 mg total) rectally every 6 (six) hours as needed for nausea.   12 each   0   . promethazine (PHENERGAN) 12.5 MG tablet   Oral   Take 1 tablet (12.5 mg total) by mouth every 6 (six) hours as needed for nausea.   12 tablet   0    BP 151/82  Pulse 89  Temp(Src) 98 F (36.7 C) (Oral)  Resp 18  Wt 162 lb 8 oz (73.71 kg)  BMI 27.04 kg/m2  SpO2 97% Physical Exam  Nursing note and vitals reviewed. Constitutional: She is oriented to person, place, and time. She appears well-developed and well-nourished. No distress.  Awake, alert, nontoxic appearance  HENT:  Head: Atraumatic.  Mouth/Throat: Oropharynx is clear and moist.  Eyes: Conjunctivae and EOM are normal. Pupils are equal, round, and reactive to light. Right eye exhibits no discharge. Left eye exhibits no discharge.  Neck: Neck supple.  Cardiovascular: Normal rate and regular rhythm.   Pulmonary/Chest: Effort normal. No respiratory distress. She exhibits no tenderness.  Abdominal: Soft. There is tenderness (Mild epigastric tenderness on palpation without guarding or rebound tenderness. No overlying skin changes.). There is no rebound and no guarding.  Musculoskeletal: She exhibits no tenderness (Tenderness to right anterior hip on palpation with decreased range of motion. Tenderness to the lumbar spine without crepitus or step-off.).  ROM appears intact, no obvious focal weakness  Neurological: She is alert and oriented to person, place, and time.  Mental status and motor strength appears intact  Skin: No rash noted.  Psychiatric: She has a normal mood and affect.    ED Course  Procedures (including critical care  time)   Date: 02/11/2013  Rate: 98  Rhythm: normal sinus rhythm  QRS Axis: normal  Intervals: normal  ST/T Wave abnormalities: nonspecific ST/T changes  Conduction Disutrbances:none  Narrative Interpretation:   Old EKG Reviewed: unchanged    7:03 AM Pt here with abd pain with n/v after she was recommended to f/u with oncologist for further evaluation of ?pathologic fx.  Since she has abd pain with n/v, will obtain abd/pelvis CT for further evaluation.  Work up initiated.  IVF, antinausea, and pain medication given.    7:03 AM Abdominal and pelvis CT scan shows no acute finding. Evidence of lumbar compression fractures and sacral fractures were noted as seen on prior MRI. Patient continues to endorse nausea and unable to tolerates by mouth, we'll continue to monitor and control her symptoms. Otherwise her labs are reassuring. UA is negative, troponin is unremarkable, normal H&H, no leukocytosis, electrolytes are reassuring, and normal lipase.  7:03 AM Patient felt better. She is able to ambulate and able to tolerates by mouth.  Labs Review Labs Reviewed  CBC WITH DIFFERENTIAL - Abnormal; Notable for the following:    Platelets 505 (*)    All other components within normal limits  COMPREHENSIVE METABOLIC PANEL - Abnormal; Notable for the following:    Potassium 3.4 (*)    Glucose, Bld 104 (*)    GFR calc non Af Amer 65 (*)    GFR calc Af Amer 76 (*)    All other components within normal limits  CG4 I-STAT (LACTIC ACID) - Abnormal; Notable for the following:    Lactic Acid, Venous 2.45 (*)  All other components within normal limits  LIPASE, BLOOD  URINALYSIS, ROUTINE W REFLEX MICROSCOPIC  POCT I-STAT TROPONIN I   Imaging Review Dg Abd Acute W/chest  02/12/2013   CLINICAL DATA:  Upper abdominal pain with nausea and vomiting.  EXAM: ACUTE ABDOMEN SERIES (ABDOMEN 2 VIEW & CHEST 1 VIEW)  COMPARISON:  CT scan dated 02/11/2013 and chest x-ray dated 06/25/2012  FINDINGS: There is  no evidence of dilated bowel loops or free intraperitoneal air. No radiopaque calculi or other significant radiographic abnormality is seen. Oral contrast is seen in the right side of the colon from the recent abdominal CT scan. Heart size and mediastinal contours are within normal limits. Both lungs are clear.  Again noted are multiple spine fractures, appearing unchanged.  IMPRESSION: No acute abnormality in the abdomen. No acute cardiopulmonary disease.   Electronically Signed   By: Geanie Cooley M.D.   On: 02/12/2013 13:27   Ct Cta Abd/pel W/cm &/or W/o Cm  02/12/2013   *RADIOLOGY REPORT*  Clinical Data:  Evaluate for mesenteric ischemia, abdominal pain, emesis  CT ANGIOGRAPHY ABDOMEN AND PELVIS  Technique:  Multidetector CT imaging of the abdomen and pelvis was performed using the standard protocol during bolus administration of intravenous contrast.  Multiplanar reconstructed images including MIPs were obtained and reviewed to evaluate the vascular anatomy.  Contrast: OMNIPAQUE IOHEXOL 350 MG/ML SOLN  Comparison:  CT abdomen pelvis - 02/11/2013; 06/30/2012  Vascular Findings:  Abdominal aorta:  Scattered mixed calcified and noncalcified atherosclerotic plaque within a mildly tortuous but normal caliber abdominal aorta.  No abdominal aortic dissection or periaortic stranding.  Celiac artery:  There is a minimal amount of calcified atherosclerotic plaque involving the origin of the celiac artery, not resulting in hemodynamically significant stenosis.  SMA:  Widely patent.  The right and left hepatic arteries are incidentally noted to arise from the proximal SMA.  Right renal artery:  Duplicated; the codominant right-sided renal arteries are widely patent without hemodynamically significant narrowing.  Left renal artery:  Solitary; there is calcified plaque involving the cranial aspect of the origin and proximal left renal artery, not resulting in hemodynamically significant stenosis.  IAM:  Widely  patent.  Pelvic vasculature:  There is scattered minimal atherosclerotic plaque within the bilateral common and internal iliac arteries, not resulting in hemodynamically significant stenosis.  The bilateral external iliac arteries are widely patent without hemodynamically significant narrowing.  Review of the MIP images confirms the above findings.  ----------------------------------------------------  Nonvascular findings:  Normal hepatic contour. Punctate (<5 mm) hypoattenuating lesion within the anterior segment right lobe of the liver (image 19, series five) is too small to accurately characterize a favored to represent a hepatic cyst.  Post cholecystectomy.  There is mild dilatation of the common bile duct measuring approximately 1.1 cm in greatest oblique coronal dimension as well as very mild centralized intrahepatic biliary duct dilatation, presumably as well post cholecystectomy state.  No ascites.  There is symmetric enhancement and excretion of the bilateral kidneys.  No definite renal stones on the post contrast examination.  Punctate sub centimeter hypoattenuating lesion within the left kidney is too small to fully characterize but favored to represent a hepatic cyst.  No discrete right-sided renal lesions. No definite renal stones as post contrast examination.  No obstruction.  There is a very minimal amount of grossly symmetric likely.  Perinephric stranding.  Normal appearance of the bilateral adrenal glands, pancreas and spleen.  Ingested enteric contrast extends to the level of the hepatic  flexure of the colon.  The descending and sigmoid colon are underdistended.  No evidence of enteric obstruction.  No discrete abnormal areas of bowel wall enhancement.  The appendix is not visualized, however there is no inflammatory change within the right lower abdominal quadrant.  No pneumoperitoneum, pneumatosis or portal venous gas.  No retroperitoneal, mesenteric, pelvic or inguinal lymphadenopathy.  Post  hysterectomy.  No discrete adnexal lesion.  No free fluid in the pelvis.  No focal airspace opacities.  There is minimal subsegmental atelectasis with the caudal segment lingula.  No pleural effusion.  Borderline cardiomegaly.  No pericardial effusion.  No acute or aggressive osseous abnormalities.  Redemonstrated severe compression deformities of the L1 and L3 vertebral bodies. Grossly unchanged mild (approximately 3 mm) of anterolisthesis of L4 upon L5. Moderate to severe multilevel lumbar spine DDD.  IMPRESSION:  1.  No explanation for patient's abdominal pain and emesis. Specifically, no evidence of mesenteric ischemia. 2.  Scattered minimal atherosclerotic plaque within a tortuous but normal caliber abdominal aorta.  2.  Grossly unchanged as severe (> 75%) compression deformities of the L1 and L3 vertebral bodies.   Original Report Authenticated By: Tacey Ruiz, MD    MDM   1. Nausea & vomiting    BP 151/82  Pulse 89  Temp(Src) 98 F (36.7 C) (Oral)  Resp 18  Wt 162 lb 8 oz (73.71 kg)  BMI 27.04 kg/m2  SpO2 97%  I have reviewed nursing notes and vital signs. I personally reviewed the imaging tests through PACS system  I reviewed available ER/hospitalization records thought the EMR     Fayrene Helper, [place] 02/14/13 1610

## 2013-02-15 NOTE — ED Provider Notes (Signed)
Medical screening examination/treatment/procedure(s) were performed by non-physician practitioner and as supervising physician I was immediately available for consultation/collaboration.  Geoffery Lyons, MD 02/15/13 [phone]

## 2013-02-15 NOTE — ED Provider Notes (Signed)
See prior note   Ward Givens, MD 02/15/13 2217

## 2013-02-19 ENCOUNTER — Encounter: Payer: Self-pay | Admitting: Internal Medicine

## 2013-02-19 ENCOUNTER — Other Ambulatory Visit (HOSPITAL_BASED_OUTPATIENT_CLINIC_OR_DEPARTMENT_OTHER): Payer: Medicare Other | Admitting: Lab

## 2013-02-19 ENCOUNTER — Other Ambulatory Visit: Payer: Self-pay | Admitting: Internal Medicine

## 2013-02-19 ENCOUNTER — Ambulatory Visit (HOSPITAL_BASED_OUTPATIENT_CLINIC_OR_DEPARTMENT_OTHER): Payer: Medicare Other | Admitting: Internal Medicine

## 2013-02-19 ENCOUNTER — Telehealth: Payer: Self-pay | Admitting: Internal Medicine

## 2013-02-19 ENCOUNTER — Ambulatory Visit: Payer: Medicare Other

## 2013-02-19 VITALS — BP 156/76 | HR 97 | Temp 97.4°F | Resp 20 | Ht 65.0 in | Wt 158.4 lb

## 2013-02-19 DIAGNOSIS — C9000 Multiple myeloma not having achieved remission: Secondary | ICD-10-CM

## 2013-02-19 DIAGNOSIS — M549 Dorsalgia, unspecified: Secondary | ICD-10-CM

## 2013-02-19 DIAGNOSIS — E876 Hypokalemia: Secondary | ICD-10-CM

## 2013-02-19 DIAGNOSIS — D472 Monoclonal gammopathy: Secondary | ICD-10-CM

## 2013-02-19 LAB — COMPREHENSIVE METABOLIC PANEL (CC13)
ALT: 11 U/L (ref 0–55)
AST: 12 U/L (ref 5–34)
Albumin: 3.9 g/dL (ref 3.5–5.0)
Alkaline Phosphatase: 87 U/L (ref 40–150)
BUN: 12.6 mg/dL (ref 7.0–26.0)
Calcium: 10 mg/dL (ref 8.4–10.4)
Creatinine: 1 mg/dL (ref 0.6–1.1)
Potassium: 2.8 mEq/L — CL (ref 3.5–5.1)
Total Bilirubin: 0.74 mg/dL (ref 0.20–1.20)

## 2013-02-19 LAB — CBC WITH DIFFERENTIAL/PLATELET
BASO%: 0.3 % (ref 0.0–2.0)
EOS%: 1.8 % (ref 0.0–7.0)
HCT: 41.1 % (ref 34.8–46.6)
LYMPH%: 24.5 % (ref 14.0–49.7)
MCH: 29 pg (ref 25.1–34.0)
MCHC: 34.1 g/dL (ref 31.5–36.0)
MCV: 85.3 fL (ref 79.5–101.0)
MONO%: 8 % (ref 0.0–14.0)
NEUT%: 65.4 % (ref 38.4–76.8)
Platelets: 456 10*3/uL — ABNORMAL HIGH (ref 145–400)
RBC: 4.82 10*6/uL (ref 3.70–5.45)
WBC: 9.2 10*3/uL (ref 3.9–10.3)
nRBC: 0 % (ref 0–0)

## 2013-02-19 MED ORDER — POTASSIUM CHLORIDE CRYS ER 20 MEQ PO TBCR: 20.0000 meq | EXTENDED_RELEASE_TABLET | Freq: Two times a day (BID) | ORAL | Status: DC

## 2013-02-19 NOTE — Telephone Encounter (Signed)
gv and printed appt sched and avs for pt for OCT...pt aware cs wil contact to sched appts.

## 2013-02-19 NOTE — Progress Notes (Signed)
Checked in new pt with no financial concerns. °

## 2013-02-19 NOTE — Progress Notes (Deleted)
[facility] [phone]   [phone]  OFFICE PROGRESS NOTE  [name], MD [address] [place] [address]  DIAGNOSIS: ***  PRIOR THERAPY:  CURRENT THERAPY:  INTERVAL HISTORY: [name] [name] 77 y.o. female returns to the clinic today for   MEDICAL HISTORY: Past Medical History  Diagnosis Date  . CAD (coronary artery disease)     LHC 2003 with 40% pLAD, 30% mLAD, 100% D1 (moderate vessel), 100%  D2 (small vessel), 95% mid small OM2.  Pt had PTCA to D1;  D2 and OM2 small vessels and tx medically;  Last Myoview (5/09):  EF 66%, anteroseptal/apical infarct with minimal peri-infarct ischemia.  No significant change from 11/06. Pt managed medically  . Recurrent aspiration bronchitis/pneumonia     PFTs 12/09 FVC 97%, FEV1 101%, ratio 77%, TLC 109%. No significant obstruction or restriction.   . Hypertension   . Hyperlipidemia   . Esophageal stricture   . Diverticulosis of colon   . Gastroesophageal reflux disease with hiatal hernia   . Iron deficiency anemia   . Gastritis   . GERD (gastroesophageal reflux disease)   . Hiatal hernia   . Asthma   . Blood transfusion   . Cough   . Wheezing   . Weakness   . Trouble swallowing   . Abdominal pain   . Hiatal hernia   . Shortness of breath     ALLERGIES:  is allergic to aspartame and daliresp.  MEDICATIONS:  Current Outpatient Prescriptions  Medication Sig Dispense Refill  . Aclidinium Bromide (TUDORZA PRESSAIR) 400 MCG/ACT AEPB Inhale 1 puff into the lungs 2 (two) times daily.      [name] ALPRAZolam (XANAX) 0.5 MG tablet Take 0.5 mg by mouth at bedtime as needed. For sleep      . amLODipine (NORVASC) 10 MG tablet Take 10 mg by mouth every morning.       . Cholecalciferol (VITAMIN D) 1000 UNITS capsule Take 6,000 Units by mouth daily.       [name] CORAL CALCIUM PO Take 1 tablet by mouth at bedtime. 1000mg       . Cyanocobalamin (VITAMIN B 12 PO) Take 1 tablet by mouth  daily.       [name] gabapentin (NEURONTIN) 300 MG capsule Take 300 mg by mouth at bedtime.      . hydrochlorothiazide 25 MG tablet Take 25 mg by mouth daily with breakfast.       . losartan (COZAAR) 100 MG tablet Take 100 mg by mouth daily.      [name] MAGNESIUM CARBONATE PO Take 400 mg by mouth 3 (three) times daily.      . metoprolol tartrate (LOPRESSOR) 25 MG tablet Take 12.5 mg by mouth at bedtime.      . mometasone-formoterol (DULERA) 200-5 MCG/ACT AERO Inhale 2 puffs into the lungs 2 (two) times daily.      . ondansetron (ZOFRAN-ODT) 4 MG disintegrating tablet Take 4 mg by mouth every 8 (eight) hours as needed for nausea.      . pantoprazole (PROTONIX) 40 MG tablet Take 40 mg by mouth 2 (two) times daily.      . pravastatin (PRAVACHOL) 40 MG tablet Take 40 mg by mouth daily.      . metoCLOPramide (REGLAN) 10 MG tablet Take 1 tablet (10 mg total) by mouth every 6 (six) hours as needed (Nausea).  20 tablet  0  . Multiple Vitamin (MULTIVITAMIN WITH MINERALS) TABS Take 1 tablet by mouth  daily.      . omeprazole (PRILOSEC) 20 MG capsule Take 20 mg by mouth daily.      . promethazine (PHENERGAN) 12.5 MG suppository Place 1 suppository (12.5 mg total) rectally every 6 (six) hours as needed for nausea.  12 each  0  . promethazine (PHENERGAN) 12.5 MG tablet Take 1 tablet (12.5 mg total) by mouth every 6 (six) hours as needed for nausea.  12 tablet  0   No current facility-administered medications for this visit.    SURGICAL HISTORY:  Past Surgical History  Procedure Laterality Date  . Back surgery    . Cholecystectomy    . Bladder repair    . Hemorrhoid surgery    . Cholecystectomy      Patient unsure of date.  [name] Appendectomy      patient unsure of date  . Total abdominal hysterectomy  1975    partial  . Coronary angioplasty  11/16/2001  . Eye surgery  11/2000    bilateral cataracts with lens implant  . Hiatal hernia repair  06/03/2011    Procedure: LAPAROSCOPIC REPAIR OF HIATAL HERNIA;   Surgeon: Ernestene Mention, MD;  Location: [facility];  Service: General;  Laterality: N/A;  . Laparoscopic nissen fundoplication  06/03/2011    Procedure: LAPAROSCOPIC NISSEN FUNDOPLICATION;  Surgeon: Ernestene Mention, MD;  Location: [facility];  Service: General;  Laterality: N/A;  . Lumbar laminectomy/decompression microdiscectomy Right 06/26/2012    Procedure: LUMBAR LAMINECTOMY/DECOMPRESSION MICRODISCECTOMY;  Surgeon: Kerrin Champagne, MD;  Location: [facility] OR;  Service: Orthopedics;  Laterality: Right;  RIGHT L4-5 MICRODISCECTOMY    REVIEW OF SYSTEMS:  {Ros - complete:30496}   PHYSICAL EXAMINATION: {physical exam:21449}  ECOG PERFORMANCE STATUS: {CHL ONC ECOG PS:[phone]}  Blood pressure 156/76, pulse 97, temperature 97.4 F (36.3 C), temperature source Oral, resp. rate 20, height 5\' 5"  (1.651 m), weight 158 lb 6.4 oz (71.85 kg).  LABORATORY DATA: Lab Results  Component Value Date   WBC 9.2 02/19/2013   HGB 14.0 02/19/2013   HCT 41.1 02/19/2013   MCV 85.3 02/19/2013   PLT 456* 02/19/2013      Chemistry      Component Value Date/Time   NA 139 02/19/2013 1346   NA 143 02/12/2013 1131   K 2.8* 02/19/2013 1346   K 3.4* 02/12/2013 1131   CL 102 02/12/2013 1131   CO2 24 02/19/2013 1346   CO2 22 02/12/2013 1131   BUN 12.6 02/19/2013 1346   BUN 9 02/12/2013 1131   CREATININE 1.0 02/19/2013 1346   CREATININE 0.85 02/12/2013 1131      Component Value Date/Time   CALCIUM 10.0 02/19/2013 1346   CALCIUM 9.8 02/12/2013 1131   ALKPHOS 87 02/19/2013 1346   ALKPHOS 92 02/12/2013 1131   AST 12 02/19/2013 1346   AST 19 02/12/2013 1131   ALT 11 02/19/2013 1346   ALT 13 02/12/2013 1131   BILITOT 0.74 02/19/2013 1346   BILITOT 0.5 02/12/2013 1131       RADIOGRAPHIC STUDIES: Ct Abdomen Pelvis W Contrast  02/11/2013   CLINICAL DATA:  Nausea and vomiting. Abdominal pain and soreness. Chills. Right hip pain.  EXAM: CT ABDOMEN AND PELVIS WITH CONTRAST  TECHNIQUE: Multidetector CT imaging of the abdomen and  pelvis was performed using the standard protocol following bolus administration of intravenous contrast.  CONTRAST:  OMNIPAQUE IOHEXOL 300 MG/ML  SOLN  COMPARISON:  CT abdomen and pelvis 06/20/2012. MRI lumbar spine 12/11/2012. MRI right hip 0 8/0 30/2014.  FINDINGS: There is some dependent atelectasis in lung bases. The patient is status post Nissen fundoplication with a small hiatal hernia unchanged. No pleural or pericardial effusion.  The patient is status post cholecystectomy. The liver, biliary tree, adrenal glands, spleen, pancreas and right kidney all appear normal. A 0.6 cm low attenuating lesion in the lower pole left kidney is compatible with a cyst.  The patient is status post hysterectomy. Urinary bladder is unremarkable. The stomach and small and large bowel appear normal. There is no lymphadenopathy or fluid.  Bones demonstrate compression fractures of L1, L2 and L3 as seen on the comparison MRI. Bilateral sacral a ala fractures are also identified as seen on MRI. Avascular necrosis right femoral head and right parasymphyseal pubic bone fracture as seen on prior MRI.  IMPRESSION: No acute finding.  No change in a hiatal hernia with the patient status post fundoplication.  L1, L2 and L3 compression fractures, bilateral sacral ala fractures and right parasymphyseal pubic bone fracture as seen on recent MRI scans.   Electronically Signed   By: Drusilla Kanner M.D.   On: 02/11/2013 11:51   Dg Abd Acute W/chest  02/12/2013   CLINICAL DATA:  Upper abdominal pain with nausea and vomiting.  EXAM: ACUTE ABDOMEN SERIES (ABDOMEN 2 VIEW & CHEST 1 VIEW)  COMPARISON:  CT scan dated 02/11/2013 and chest x-ray dated 06/25/2012  FINDINGS: There is no evidence of dilated bowel loops or free intraperitoneal air. No radiopaque calculi or other significant radiographic abnormality is seen. Oral contrast is seen in the right side of the colon from the recent abdominal CT scan. Heart size and mediastinal contours  are within normal limits. Both lungs are clear.  Again noted are multiple spine fractures, appearing unchanged.  IMPRESSION: No acute abnormality in the abdomen. No acute cardiopulmonary disease.   Electronically Signed   By: Geanie Cooley M.D.   On: 02/12/2013 13:27   Ct Cta Abd/pel W/cm &/or W/o Cm  02/12/2013   *RADIOLOGY REPORT*  Clinical Data:  Evaluate for mesenteric ischemia, abdominal pain, emesis  CT ANGIOGRAPHY ABDOMEN AND PELVIS  Technique:  Multidetector CT imaging of the abdomen and pelvis was performed using the standard protocol during bolus administration of intravenous contrast.  Multiplanar reconstructed images including MIPs were obtained and reviewed to evaluate the vascular anatomy.  Contrast: OMNIPAQUE IOHEXOL 350 MG/ML SOLN  Comparison:  CT abdomen pelvis - 02/11/2013; 06/30/2012  Vascular Findings:  Abdominal aorta:  Scattered mixed calcified and noncalcified atherosclerotic plaque within a mildly tortuous but normal caliber abdominal aorta.  No abdominal aortic dissection or periaortic stranding.  Celiac artery:  There is a minimal amount of calcified atherosclerotic plaque involving the origin of the celiac artery, not resulting in hemodynamically significant stenosis.  SMA:  Widely patent.  The right and left hepatic arteries are incidentally noted to arise from the proximal SMA.  Right renal artery:  Duplicated; the codominant right-sided renal arteries are widely patent without hemodynamically significant narrowing.  Left renal artery:  Solitary; there is calcified plaque involving the cranial aspect of the origin and proximal left renal artery, not resulting in hemodynamically significant stenosis.  IAM:  Widely patent.  Pelvic vasculature:  There is scattered minimal atherosclerotic plaque within the bilateral common and internal iliac arteries, not resulting in hemodynamically significant stenosis.  The bilateral external iliac arteries are widely patent without  hemodynamically significant narrowing.  Review of the MIP images confirms the above findings.  ----------------------------------------------------  Nonvascular findings:  Normal hepatic  contour. Punctate (<5 mm) hypoattenuating lesion within the anterior segment right lobe of the liver (image 19, series five) is too small to accurately characterize a favored to represent a hepatic cyst.  Post cholecystectomy.  There is mild dilatation of the common bile duct measuring approximately 1.1 cm in greatest oblique coronal dimension as well as very mild centralized intrahepatic biliary duct dilatation, presumably as well post cholecystectomy state.  No ascites.  There is symmetric enhancement and excretion of the bilateral kidneys.  No definite renal stones on the post contrast examination.  Punctate sub centimeter hypoattenuating lesion within the left kidney is too small to fully characterize but favored to represent a hepatic cyst.  No discrete right-sided renal lesions. No definite renal stones as post contrast examination.  No obstruction.  There is a very minimal amount of grossly symmetric likely.  Perinephric stranding.  Normal appearance of the bilateral adrenal glands, pancreas and spleen.  Ingested enteric contrast extends to the level of the hepatic flexure of the colon.  The descending and sigmoid colon are underdistended.  No evidence of enteric obstruction.  No discrete abnormal areas of bowel wall enhancement.  The appendix is not visualized, however there is no inflammatory change within the right lower abdominal quadrant.  No pneumoperitoneum, pneumatosis or portal venous gas.  No retroperitoneal, mesenteric, pelvic or inguinal lymphadenopathy.  Post hysterectomy.  No discrete adnexal lesion.  No free fluid in the pelvis.  No focal airspace opacities.  There is minimal subsegmental atelectasis with the caudal segment lingula.  No pleural effusion.  Borderline cardiomegaly.  No pericardial effusion.  No  acute or aggressive osseous abnormalities.  Redemonstrated severe compression deformities of the L1 and L3 vertebral bodies. Grossly unchanged mild (approximately 3 mm) of anterolisthesis of L4 upon L5. Moderate to severe multilevel lumbar spine DDD.  IMPRESSION:  1.  No explanation for patient's abdominal pain and emesis. Specifically, no evidence of mesenteric ischemia. 2.  Scattered minimal atherosclerotic plaque within a tortuous but normal caliber abdominal aorta.  2.  Grossly unchanged as severe (> 75%) compression deformities of the L1 and L3 vertebral bodies.   Original Report Authenticated By: Tacey Ruiz, MD    ASSESSMENT AND PLAN: ***     The patient voices understanding of current disease status and treatment options and is in agreement with the current care plan.  All questions were answered. The patient knows to call the clinic with any problems, questions or concerns. We can certainly see the patient much sooner if necessary.  I spent {CHL ONC TIME VISIT - ZOXWR:[id number]} counseling the patient face to face. The total time spent in the appointment was {CHL ONC TIME VISIT - BJYNW:[id number]}.

## 2013-02-19 NOTE — Progress Notes (Signed)
[facility] [phone]   [phone]  CONSULT NOTE  REFERRING PHYSICIAN: Dr. Lucky Cowboy  REASON FOR CONSULTATION:  77 years old white female with questionable multiple myeloma  HPI [name] [name] is a 77 y.o. female with past medical history significant for coronary artery disease, recurrent aspiration and hypokalemia secondary to diuretics in addition to hypertension and history of TIA. The patient has been complaining of low back pain as well as pain in the right hip area. MRI of the right hip was ordered by Dr. Otelia Sergeant on 01/05/2013 and it showed Findings consistent with acute or subacute right parasymphysealpubic bone fracture. Negative for hip fracture.Sacral insufficiency fractures and L3 compression fracture are identified as seen on prior MRI. Small focus of marrow edema the posterior right ilium is also compatible with insufficiency fracture.Small area of avascular necrosis right femoral head without femoral head collapse or fragmentation. No secondary degenerative disease is seen. Dr. Bing Matter ordered a serum protein electrophoreses which was performed on 12/26/2012 and it showed the possibility of a faint restricted bands in the gamma region.  She was seen by her primary care physician Dr. Oneta Rack and he ordered urine protein electrophoresis and it showed elevated free kappa light chain of 3.85 and free lambda light chain was normal at 0.14 with a kappa/lambda ratio of 27.5. The immunofixation was positive for monoclonal free kappa light chain (Bence-Jones proteins)  CT scan of the abdomen and pelvis was performed on 02/11/2013 for evaluation of an episode of nausea vomiting and abdominal pain.  It showed no acute findings in the abdomen to explain her abdominal pain but the patient has L1, L2 and L3 compression fractures, bilateral sacral fractures and a right parasymphyseal pubic bone fractures as seen on previous MRI. She also had CT angiogram of the  abdomen and pelvis on 02/12/2013 that showed no explanation for the patient his abdominal pain and emesis but showed the grossly unchanged as severe over 75% compression deformities of the L1 and L3 vertebral bodies.  Dr. Oneta Rack kindly referred the patient to me today for further evaluation and recommendation regarding these abnormal findings and to rule out multiple myeloma. The patient continues to complain of the right hip and back pain. She is currently takes hydrocodone 1 tablet every 6-8 hours. She tries not to take too much of her pain medication for fear of addiction. She denied having any significant weight loss or night sweats. The patient denied having any chest pain, shortness of breath or hemoptysis but has mild cough. She denied having any headache or visual changes. She was recently treated for gastroenteritis and hypokalemia. Her family history significant for a mother who has a history of colon cancer and father died from heart attack at age 64, she also has a brother who had brain tumor. The patient is married and was accompanied by her husband [name]. She has 2 children a son and a daughter. She used to work as a Film/video editor. She has no history of smoking, alcohol or drug abuse. HPI  Past Medical History  Diagnosis Date  . CAD (coronary artery disease)     LHC 2003 with 40% pLAD, 30% mLAD, 100% D1 (moderate vessel), 100%  D2 (small vessel), 95% mid small OM2.  Pt had PTCA to D1;  D2 and OM2 small vessels and tx medically;  Last Myoview (5/09):  EF 66%, anteroseptal/apical infarct with minimal peri-infarct ischemia.  No significant change from 11/06. Pt managed medically  . Recurrent aspiration  bronchitis/pneumonia     PFTs 12/09 FVC 97%, FEV1 101%, ratio 77%, TLC 109%. No significant obstruction or restriction.   . Hypertension   . Hyperlipidemia   . Esophageal stricture   . Diverticulosis of colon   . Gastroesophageal reflux disease with hiatal hernia   . Iron deficiency  anemia   . Gastritis   . GERD (gastroesophageal reflux disease)   . Hiatal hernia   . Asthma   . Blood transfusion   . Cough   . Wheezing   . Weakness   . Trouble swallowing   . Abdominal pain   . Hiatal hernia   . Shortness of breath     Past Surgical History  Procedure Laterality Date  . Back surgery    . Cholecystectomy    . Bladder repair    . Hemorrhoid surgery    . Cholecystectomy      Patient unsure of date.  [name] Appendectomy      patient unsure of date  . Total abdominal hysterectomy  1975    partial  . Coronary angioplasty  11/16/2001  . Eye surgery  11/2000    bilateral cataracts with lens implant  . Hiatal hernia repair  06/03/2011    Procedure: LAPAROSCOPIC REPAIR OF HIATAL HERNIA;  Surgeon: Ernestene Mention, MD;  Location: [facility];  Service: General;  Laterality: N/A;  . Laparoscopic nissen fundoplication  06/03/2011    Procedure: LAPAROSCOPIC NISSEN FUNDOPLICATION;  Surgeon: Ernestene Mention, MD;  Location: [facility];  Service: General;  Laterality: N/A;  . Lumbar laminectomy/decompression microdiscectomy Right 06/26/2012    Procedure: LUMBAR LAMINECTOMY/DECOMPRESSION MICRODISCECTOMY;  Surgeon: Kerrin Champagne, MD;  Location: [facility] OR;  Service: Orthopedics;  Laterality: Right;  RIGHT L4-5 MICRODISCECTOMY    Family History  Problem Relation Age of Onset  . Cancer Mother     colon   . Colon cancer Mother 81  . Heart disease Father 92    heart attack    Social History History  Substance Use Topics  . Smoking status: Never Smoker   . Smokeless tobacco: Never Used  . Alcohol Use: No    Allergies  Allergen Reactions  . Aspartame Diarrhea and Nausea And Vomiting  . Daliresp [Roflumilast] Nausea And Vomiting    Current Outpatient Prescriptions  Medication Sig Dispense Refill  . Aclidinium Bromide (TUDORZA PRESSAIR) 400 MCG/ACT AEPB Inhale 1 puff into the lungs 2 (two) times daily.      [name] ALPRAZolam (XANAX) 0.5 MG tablet Take 0.5 mg by mouth at bedtime as  needed. For sleep      . amLODipine (NORVASC) 10 MG tablet Take 10 mg by mouth every morning.       . Cholecalciferol (VITAMIN D) 1000 UNITS capsule Take 6,000 Units by mouth daily.       [name] CORAL CALCIUM PO Take 1 tablet by mouth at bedtime. 1000mg       . Cyanocobalamin (VITAMIN B 12 PO) Take 1 tablet by mouth daily.       [name] gabapentin (NEURONTIN) 300 MG capsule Take 300 mg by mouth at bedtime.      . hydrochlorothiazide 25 MG tablet Take 25 mg by mouth daily with breakfast.       . losartan (COZAAR) 100 MG tablet Take 100 mg by mouth daily.      [name] MAGNESIUM CARBONATE PO Take 400 mg by mouth 3 (three) times daily.      . metoprolol tartrate (LOPRESSOR) 25 MG tablet Take 12.5 mg by  mouth at bedtime.      . mometasone-formoterol (DULERA) 200-5 MCG/ACT AERO Inhale 2 puffs into the lungs 2 (two) times daily.      . ondansetron (ZOFRAN-ODT) 4 MG disintegrating tablet Take 4 mg by mouth every 8 (eight) hours as needed for nausea.      . pantoprazole (PROTONIX) 40 MG tablet Take 40 mg by mouth 2 (two) times daily.      . pravastatin (PRAVACHOL) 40 MG tablet Take 40 mg by mouth daily.      . metoCLOPramide (REGLAN) 10 MG tablet Take 1 tablet (10 mg total) by mouth every 6 (six) hours as needed (Nausea).  20 tablet  0  . Multiple Vitamin (MULTIVITAMIN WITH MINERALS) TABS Take 1 tablet by mouth daily.      [name] omeprazole (PRILOSEC) 20 MG capsule Take 20 mg by mouth daily.      . potassium chloride SA (K-DUR,KLOR-CON) 20 MEQ tablet Take 1 tablet (20 mEq total) by mouth 2 (two) times daily. X 7 days  14 tablet  0  . promethazine (PHENERGAN) 12.5 MG suppository Place 1 suppository (12.5 mg total) rectally every 6 (six) hours as needed for nausea.  12 each  0  . promethazine (PHENERGAN) 12.5 MG tablet Take 1 tablet (12.5 mg total) by mouth every 6 (six) hours as needed for nausea.  12 tablet  0   No current facility-administered medications for this visit.    Review of Systems  Constitutional: positive for  fatigue Eyes: negative Ears, nose, mouth, throat, and face: negative Respiratory: negative Cardiovascular: negative Gastrointestinal: negative Genitourinary:negative Integument/breast: negative Hematologic/lymphatic: negative Musculoskeletal:positive for back pain and bone pain Neurological: negative Behavioral/Psych: negative Endocrine: negative Allergic/Immunologic: negative  Physical Exam  RUE:AVWUJ, healthy, no distress, well nourished and well developed SKIN: skin color, texture, turgor are normal, no rashes or significant lesions HEAD: Normocephalic, No masses, lesions, tenderness or abnormalities EYES: normal, PERRLA EARS: External ears normal OROPHARYNX:no exudate, no erythema and lips, buccal mucosa, and tongue normal  NECK: supple, no adenopathy, no JVD LYMPH:  no palpable lymphadenopathy, no hepatosplenomegaly BREAST:not examined LUNGS: clear to auscultation , and palpation HEART: regular rate & rhythm, no murmurs and no gallops ABDOMEN:abdomen soft, non-tender, normal bowel sounds and no masses or organomegaly BACK: Back symmetric, no curvature., No CVA tenderness EXTREMITIES:no joint deformities, effusion, or inflammation, no edema, no skin discoloration  NEURO: alert & oriented x 3 with fluent speech, no focal motor/sensory deficits  PERFORMANCE STATUS: ECOG 1  LABORATORY DATA: Lab Results  Component Value Date   WBC 9.2 02/19/2013   HGB 14.0 02/19/2013   HCT 41.1 02/19/2013   MCV 85.3 02/19/2013   PLT 456* 02/19/2013      Chemistry      Component Value Date/Time   NA 139 02/19/2013 1346   NA 143 02/12/2013 1131   K 2.8* 02/19/2013 1346   K 3.4* 02/12/2013 1131   CL 102 02/12/2013 1131   CO2 24 02/19/2013 1346   CO2 22 02/12/2013 1131   BUN 12.6 02/19/2013 1346   BUN 9 02/12/2013 1131   CREATININE 1.0 02/19/2013 1346   CREATININE 0.85 02/12/2013 1131      Component Value Date/Time   CALCIUM 10.0 02/19/2013 1346   CALCIUM 9.8 02/12/2013 1131    ALKPHOS 87 02/19/2013 1346   ALKPHOS 92 02/12/2013 1131   AST 12 02/19/2013 1346   AST 19 02/12/2013 1131   ALT 11 02/19/2013 1346   ALT 13 02/12/2013 1131   BILITOT 0.74  02/19/2013 1346   BILITOT 0.5 02/12/2013 1131       RADIOGRAPHIC STUDIES: Ct Abdomen Pelvis W Contrast  02/11/2013   CLINICAL DATA:  Nausea and vomiting. Abdominal pain and soreness. Chills. Right hip pain.  EXAM: CT ABDOMEN AND PELVIS WITH CONTRAST  TECHNIQUE: Multidetector CT imaging of the abdomen and pelvis was performed using the standard protocol following bolus administration of intravenous contrast.  CONTRAST:  OMNIPAQUE IOHEXOL 300 MG/ML  SOLN  COMPARISON:  CT abdomen and pelvis 06/20/2012. MRI lumbar spine 12/11/2012. MRI right hip 0 8/0 30/2014.  FINDINGS: There is some dependent atelectasis in lung bases. The patient is status post Nissen fundoplication with a small hiatal hernia unchanged. No pleural or pericardial effusion.  The patient is status post cholecystectomy. The liver, biliary tree, adrenal glands, spleen, pancreas and right kidney all appear normal. A 0.6 cm low attenuating lesion in the lower pole left kidney is compatible with a cyst.  The patient is status post hysterectomy. Urinary bladder is unremarkable. The stomach and small and large bowel appear normal. There is no lymphadenopathy or fluid.  Bones demonstrate compression fractures of L1, L2 and L3 as seen on the comparison MRI. Bilateral sacral a ala fractures are also identified as seen on MRI. Avascular necrosis right femoral head and right parasymphyseal pubic bone fracture as seen on prior MRI.  IMPRESSION: No acute finding.  No change in a hiatal hernia with the patient status post fundoplication.  L1, L2 and L3 compression fractures, bilateral sacral ala fractures and right parasymphyseal pubic bone fracture as seen on recent MRI scans.   Electronically Signed   By: Drusilla Kanner M.D.   On: 02/11/2013 11:51   Dg Abd Acute  W/chest  02/12/2013   CLINICAL DATA:  Upper abdominal pain with nausea and vomiting.  EXAM: ACUTE ABDOMEN SERIES (ABDOMEN 2 VIEW & CHEST 1 VIEW)  COMPARISON:  CT scan dated 02/11/2013 and chest x-ray dated 06/25/2012  FINDINGS: There is no evidence of dilated bowel loops or free intraperitoneal air. No radiopaque calculi or other significant radiographic abnormality is seen. Oral contrast is seen in the right side of the colon from the recent abdominal CT scan. Heart size and mediastinal contours are within normal limits. Both lungs are clear.  Again noted are multiple spine fractures, appearing unchanged.  IMPRESSION: No acute abnormality in the abdomen. No acute cardiopulmonary disease.   Electronically Signed   By: Geanie Cooley M.D.   On: 02/12/2013 13:27   Ct Cta Abd/pel W/cm &/or W/o Cm  02/12/2013   *RADIOLOGY REPORT*  Clinical Data:  Evaluate for mesenteric ischemia, abdominal pain, emesis  CT ANGIOGRAPHY ABDOMEN AND PELVIS  Technique:  Multidetector CT imaging of the abdomen and pelvis was performed using the standard protocol during bolus administration of intravenous contrast.  Multiplanar reconstructed images including MIPs were obtained and reviewed to evaluate the vascular anatomy.  Contrast: OMNIPAQUE IOHEXOL 350 MG/ML SOLN  Comparison:  CT abdomen pelvis - 02/11/2013; 06/30/2012  Vascular Findings:  Abdominal aorta:  Scattered mixed calcified and noncalcified atherosclerotic plaque within a mildly tortuous but normal caliber abdominal aorta.  No abdominal aortic dissection or periaortic stranding.  Celiac artery:  There is a minimal amount of calcified atherosclerotic plaque involving the origin of the celiac artery, not resulting in hemodynamically significant stenosis.  SMA:  Widely patent.  The right and left hepatic arteries are incidentally noted to arise from the proximal SMA.  Right renal artery:  Duplicated; the codominant right-sided  renal arteries are widely patent without  hemodynamically significant narrowing.  Left renal artery:  Solitary; there is calcified plaque involving the cranial aspect of the origin and proximal left renal artery, not resulting in hemodynamically significant stenosis.  IAM:  Widely patent.  Pelvic vasculature:  There is scattered minimal atherosclerotic plaque within the bilateral common and internal iliac arteries, not resulting in hemodynamically significant stenosis.  The bilateral external iliac arteries are widely patent without hemodynamically significant narrowing.  Review of the MIP images confirms the above findings.  ----------------------------------------------------  Nonvascular findings:  Normal hepatic contour. Punctate (<5 mm) hypoattenuating lesion within the anterior segment right lobe of the liver (image 19, series five) is too small to accurately characterize a favored to represent a hepatic cyst.  Post cholecystectomy.  There is mild dilatation of the common bile duct measuring approximately 1.1 cm in greatest oblique coronal dimension as well as very mild centralized intrahepatic biliary duct dilatation, presumably as well post cholecystectomy state.  No ascites.  There is symmetric enhancement and excretion of the bilateral kidneys.  No definite renal stones on the post contrast examination.  Punctate sub centimeter hypoattenuating lesion within the left kidney is too small to fully characterize but favored to represent a hepatic cyst.  No discrete right-sided renal lesions. No definite renal stones as post contrast examination.  No obstruction.  There is a very minimal amount of grossly symmetric likely.  Perinephric stranding.  Normal appearance of the bilateral adrenal glands, pancreas and spleen.  Ingested enteric contrast extends to the level of the hepatic flexure of the colon.  The descending and sigmoid colon are underdistended.  No evidence of enteric obstruction.  No discrete abnormal areas of bowel wall enhancement.  The  appendix is not visualized, however there is no inflammatory change within the right lower abdominal quadrant.  No pneumoperitoneum, pneumatosis or portal venous gas.  No retroperitoneal, mesenteric, pelvic or inguinal lymphadenopathy.  Post hysterectomy.  No discrete adnexal lesion.  No free fluid in the pelvis.  No focal airspace opacities.  There is minimal subsegmental atelectasis with the caudal segment lingula.  No pleural effusion.  Borderline cardiomegaly.  No pericardial effusion.  No acute or aggressive osseous abnormalities.  Redemonstrated severe compression deformities of the L1 and L3 vertebral bodies. Grossly unchanged mild (approximately 3 mm) of anterolisthesis of L4 upon L5. Moderate to severe multilevel lumbar spine DDD.  IMPRESSION:  1.  No explanation for patient's abdominal pain and emesis. Specifically, no evidence of mesenteric ischemia. 2.  Scattered minimal atherosclerotic plaque within a tortuous but normal caliber abdominal aorta.  2.  Grossly unchanged as severe (> 75%) compression deformities of the L1 and L3 vertebral bodies.   Original Report Authenticated By: Tacey Ruiz, MD    ASSESSMENT: This is a very pleasant 77 years old white female with recently diagnosed monoclonal gammopathy suspicious for multiple myeloma in the setting of multiple compression fractures in the lumbar spines.   PLAN: I had a lengthy discussion with the patient and her husband today about her current disease status and further investigation to confirm or rule out multiple myeloma. I ordered several studies today including repeat CBC, comprehensive metabolic panel, LDH and addition to myeloma panel. I also discussed with the patient and her husband consideration of proceeding with a bone marrow biopsy and aspirate to evaluate the bone marrow percentage of plasma cells. I will also arrange for the patient to have skeletal bone survey. For pain management she was encouraged to take  her hydrocodone on  an as-needed basis to control her pain. I would see the patient back for follow up visit in 2 weeks for evaluation and discussion of her biopsy, lab and imaging results. The patient was advised to call immediately if she has any concerning symptoms in the interval.  The patient voices understanding of current disease status and treatment options and is in agreement with the current care plan.  All questions were answered. The patient knows to call the clinic with any problems, questions or concerns. We can certainly see the patient much sooner if necessary.  Thank you so much for allowing me to participate in the care of Lara Mulch. I will continue to follow up the patient with you and assist in her care.  I spent 40 minutes counseling the patient face to face. The total time spent in the appointment was 60 minutes.  Bre Pecina K. 02/19/2013, 9:00 PM

## 2013-02-19 NOTE — Patient Instructions (Signed)
I ordered several studies today to evaluate your condition including repeat myeloma panel, skeletal bone survey and bone marrow biopsy and aspirate. Followup visit in 2 weeks

## 2013-02-20 ENCOUNTER — Encounter (HOSPITAL_COMMUNITY): Payer: Self-pay | Admitting: Pharmacy Technician

## 2013-02-20 LAB — IGG, IGA, IGM
IgA: 313 mg/dL (ref 69–380)
IgG (Immunoglobin G), Serum: 993 mg/dL (ref 690–1700)

## 2013-02-20 LAB — KAPPA/LAMBDA LIGHT CHAINS
Kappa free light chain: 3.65 mg/dL — ABNORMAL HIGH (ref 0.33–1.94)
Kappa:Lambda Ratio: 1.88 — ABNORMAL HIGH (ref 0.26–1.65)
Lambda Free Lght Chn: 1.94 mg/dL (ref 0.57–2.63)

## 2013-02-20 LAB — BETA 2 MICROGLOBULIN, SERUM: Beta-2 Microglobulin: 2.41 mg/L — ABNORMAL HIGH (ref 1.01–1.73)

## 2013-02-21 ENCOUNTER — Other Ambulatory Visit: Payer: Self-pay | Admitting: Radiology

## 2013-02-26 ENCOUNTER — Ambulatory Visit (HOSPITAL_COMMUNITY)
Admission: RE | Admit: 2013-02-26 | Discharge: 2013-02-26 | Disposition: A | Discharge status: Home / Self Care | Payer: Medicare Other | Source: Ambulatory Visit | Attending: Internal Medicine | Admitting: Internal Medicine

## 2013-02-26 ENCOUNTER — Encounter (HOSPITAL_COMMUNITY): Payer: Self-pay

## 2013-02-26 DIAGNOSIS — K219 Gastro-esophageal reflux disease without esophagitis: Secondary | ICD-10-CM | POA: Insufficient documentation

## 2013-02-26 DIAGNOSIS — C9000 Multiple myeloma not having achieved remission: Secondary | ICD-10-CM

## 2013-02-26 DIAGNOSIS — Z90710 Acquired absence of both cervix and uterus: Secondary | ICD-10-CM | POA: Insufficient documentation

## 2013-02-26 DIAGNOSIS — I2510 Atherosclerotic heart disease of native coronary artery without angina pectoris: Secondary | ICD-10-CM | POA: Insufficient documentation

## 2013-02-26 DIAGNOSIS — Z9089 Acquired absence of other organs: Secondary | ICD-10-CM | POA: Insufficient documentation

## 2013-02-26 DIAGNOSIS — I10 Essential (primary) hypertension: Secondary | ICD-10-CM | POA: Insufficient documentation

## 2013-02-26 DIAGNOSIS — Z9861 Coronary angioplasty status: Secondary | ICD-10-CM | POA: Insufficient documentation

## 2013-02-26 DIAGNOSIS — K449 Diaphragmatic hernia without obstruction or gangrene: Secondary | ICD-10-CM | POA: Insufficient documentation

## 2013-02-26 DIAGNOSIS — E785 Hyperlipidemia, unspecified: Secondary | ICD-10-CM | POA: Insufficient documentation

## 2013-02-26 DIAGNOSIS — D472 Monoclonal gammopathy: Secondary | ICD-10-CM | POA: Insufficient documentation

## 2013-02-26 LAB — CBC
MCH: 29.6 pg (ref 26.0–34.0)
MCV: 87.6 fL (ref 78.0–100.0)
Platelets: 415 10*3/uL — ABNORMAL HIGH (ref 150–400)
RBC: 4.67 MIL/uL (ref 3.87–5.11)
RDW: 14.7 % (ref 11.5–15.5)
WBC: 5.9 10*3/uL (ref 4.0–10.5)

## 2013-02-26 LAB — BONE MARROW EXAM

## 2013-02-26 LAB — PROTIME-INR
INR: 0.88 (ref 0.00–1.49)
Prothrombin Time: 11.8 seconds (ref 11.6–15.2)

## 2013-02-26 LAB — APTT: aPTT: 35 seconds (ref 24–37)

## 2013-02-26 MED ORDER — MIDAZOLAM HCL 2 MG/2ML IJ SOLN
INTRAMUSCULAR | Status: AC
  Filled 2013-02-26: qty 6

## 2013-02-26 MED ORDER — SODIUM CHLORIDE 0.9 % IV SOLN
Freq: Once | INTRAVENOUS | Status: AC
  Administered 2013-02-26: 10:00:00 via INTRAVENOUS

## 2013-02-26 MED ORDER — MIDAZOLAM HCL 2 MG/2ML IJ SOLN
INTRAMUSCULAR | Status: AC | PRN
  Administered 2013-02-26 (×3): 1 mg via INTRAVENOUS

## 2013-02-26 MED ORDER — FENTANYL CITRATE 0.05 MG/ML IJ SOLN
INTRAMUSCULAR | Status: AC | PRN
  Administered 2013-02-26 (×3): 50 ug via INTRAVENOUS

## 2013-02-26 MED ORDER — FENTANYL CITRATE 0.05 MG/ML IJ SOLN
INTRAMUSCULAR | Status: AC
  Filled 2013-02-26: qty 6

## 2013-02-26 NOTE — H&P (Signed)
[name] [name] is an 77 y.o. female.   Chief Complaint: "I'm here for a bone biopsy" HPI: Patient with history of monoclonal gammopathy suspicious for multiple myeloma presents today for CT guided bone marrow biopsy.  Past Medical History  Diagnosis Date  . CAD (coronary artery disease)     LHC 2003 with 40% pLAD, 30% mLAD, 100% D1 (moderate vessel), 100%  D2 (small vessel), 95% mid small OM2.  Pt had PTCA to D1;  D2 and OM2 small vessels and tx medically;  Last Myoview (5/09):  EF 66%, anteroseptal/apical infarct with minimal peri-infarct ischemia.  No significant change from 11/06. Pt managed medically  . Recurrent aspiration bronchitis/pneumonia     PFTs 12/09 FVC 97%, FEV1 101%, ratio 77%, TLC 109%. No significant obstruction or restriction.   . Hypertension   . Hyperlipidemia   . Esophageal stricture   . Diverticulosis of colon   . Gastroesophageal reflux disease with hiatal hernia   . Iron deficiency anemia   . Gastritis   . GERD (gastroesophageal reflux disease)   . Hiatal hernia   . Asthma   . Blood transfusion   . Cough   . Wheezing   . Weakness   . Trouble swallowing   . Abdominal pain   . Hiatal hernia   . Shortness of breath     Past Surgical History  Procedure Laterality Date  . Back surgery    . Cholecystectomy    . Bladder repair    . Hemorrhoid surgery    . Cholecystectomy      Patient unsure of date.  [name] Appendectomy      patient unsure of date  . Total abdominal hysterectomy  1975    partial  . Coronary angioplasty  11/16/2001  . Eye surgery  11/2000    bilateral cataracts with lens implant  . Hiatal hernia repair  06/03/2011    Procedure: LAPAROSCOPIC REPAIR OF HIATAL HERNIA;  Surgeon: Ernestene Mention, MD;  Location: [facility];  Service: General;  Laterality: N/A;  . Laparoscopic nissen fundoplication  06/03/2011    Procedure: LAPAROSCOPIC NISSEN FUNDOPLICATION;  Surgeon: Ernestene Mention, MD;  Location: [facility];  Service: General;  Laterality: N/A;  .  Lumbar laminectomy/decompression microdiscectomy Right 06/26/2012    Procedure: LUMBAR LAMINECTOMY/DECOMPRESSION MICRODISCECTOMY;  Surgeon: Kerrin Champagne, MD;  Location: [facility] OR;  Service: Orthopedics;  Laterality: Right;  RIGHT L4-5 MICRODISCECTOMY    Family History  Problem Relation Age of Onset  . Cancer Mother     colon   . Colon cancer Mother 75  . Heart disease Father 57    heart attack   Social History:  reports that she has never smoked. She has never used smokeless tobacco. She reports that she does not drink alcohol or use illicit drugs.  Allergies:  Allergies  Allergen Reactions  . Aspartame Diarrhea and Nausea And Vomiting  . Daliresp [Roflumilast] Nausea And Vomiting    Current outpatient prescriptions:Aclidinium Bromide (TUDORZA PRESSAIR) 400 MCG/ACT AEPB, Inhale 1 puff into the lungs 2 (two) times daily., Disp: , Rfl: ;  ALPRAZolam (XANAX) 0.5 MG tablet, Take 0.5 mg by mouth 3 (three) times daily as needed for sleep or anxiety. , Disp: , Rfl: ;  amLODipine (NORVASC) 10 MG tablet, Take 10 mg by mouth every morning. , Disp: , Rfl:  calcitonin, salmon, (MIACALCIN/FORTICAL) 200 UNIT/ACT nasal spray, Place 1 spray into the nose every morning. She alternates nostrils every other day., Disp: , Rfl: ;  Cholecalciferol (VITAMIN D3)  2000 UNITS TABS, Take 2,000-4,000 Units by mouth 2 (two) times daily. She takes two tablets in the morning and one tablet at bedtime., Disp: , Rfl: ;  Cyanocobalamin (VITAMIN B 12 PO), Take 1 tablet by mouth every morning. , Disp: , Rfl:  gabapentin (NEURONTIN) 300 MG capsule, Take 300 mg by mouth at bedtime as needed (For pain.). , Disp: , Rfl: ;  hydrochlorothiazide 25 MG tablet, Take 25 mg by mouth daily with breakfast. , Disp: , Rfl: ;  losartan (COZAAR) 100 MG tablet, Take 100 mg by mouth every morning. , Disp: , Rfl: ;  metoCLOPramide (REGLAN) 10 MG tablet, Take 1 tablet (10 mg total) by mouth every 6 (six) hours as needed (Nausea)., Disp: 20 tablet, Rfl:  0 metoprolol tartrate (LOPRESSOR) 25 MG tablet, Take 12.5 mg by mouth at bedtime., Disp: , Rfl: ;  mometasone-formoterol (DULERA) 200-5 MCG/ACT AERO, Inhale 2 puffs into the lungs 2 (two) times daily., Disp: , Rfl: ;  Multiple Vitamin (MULTIVITAMIN WITH MINERALS) TABS, Take 1 tablet by mouth every morning. , Disp: , Rfl: ;  OVER THE COUNTER MEDICATION, Take 1,000 mg by mouth every other day. Coral Calcium 1000mg , Disp: , Rfl:  OVER THE COUNTER MEDICATION, Take 400 mg by mouth 3 (three) times daily. Magnesium Carbonate 400mg , Disp: , Rfl: ;  pantoprazole (PROTONIX) 40 MG tablet, Take 40 mg by mouth 2 (two) times daily., Disp: , Rfl: ;  pravastatin (PRAVACHOL) 40 MG tablet, Take 40 mg by mouth at bedtime. , Disp: , Rfl:  promethazine (PHENERGAN) 12.5 MG suppository, Place 1 suppository (12.5 mg total) rectally every 6 (six) hours as needed for nausea., Disp: 12 each, Rfl: 0;  promethazine (PHENERGAN) 12.5 MG tablet, Take 1 tablet (12.5 mg total) by mouth every 6 (six) hours as needed for nausea., Disp: 12 tablet, Rfl: 0 Current facility-administered medications:fentaNYL (SUBLIMAZE) 0.05 MG/ML injection, , , , ;  midazolam (VERSED) 2 MG/2ML injection, , , ,    Results for orders placed during the hospital encounter of 02/26/13 (from the past 48 hour(s))  CBC     Status: Abnormal   Collection Time    02/26/13  9:30 AM      Result Value Range   WBC 5.9  4.0 - 10.5 K/uL   RBC 4.67  3.87 - 5.11 MIL/uL   Hemoglobin 13.8  12.0 - 15.0 g/dL   HCT 16.1  09.6 - 04.5 %   MCV 87.6  78.0 - 100.0 fL   MCH 29.6  26.0 - 34.0 pg   MCHC 33.7  30.0 - 36.0 g/dL   RDW 40.9  81.1 - 91.4 %   Platelets 415 (*) 150 - 400 K/uL   Dg Bone Survey Met  02/26/2013   CLINICAL DATA:  Multiple myeloma.  EXAM: METASTATIC BONE SURVEY  COMPARISON:  Multiple exams, including 11/19/2008  FINDINGS: Calvarial thickening noted, but with the without well-defined focal myeloma lesions in the calvarium.  Known compression fractures at L1  and L3. L4 and L5 laminectomies. Anterolisthesis at L4-5.  Suspected hiatal hernia.  IMPRESSION: 1. No focal myelomatous lesions are identified. 2. Known compression fractures at L1 and L3. 3. Hiatal hernia.   Electronically Signed   By: Herbie Baltimore M.D.   On: 02/26/2013 09:15    Review of Systems  Constitutional: Positive for chills. Negative for fever.  Respiratory: Positive for cough. Negative for hemoptysis and shortness of breath.   Cardiovascular: Negative for chest pain.  Gastrointestinal: Negative for nausea, vomiting and  abdominal pain.  Musculoskeletal: Positive for back pain and joint pain.  Neurological: Negative for headaches.  Endo/Heme/Allergies: Does not bruise/bleed easily.    Blood pressure 143/76, pulse 85, temperature 97 F (36.1 C), temperature source Oral, resp. rate 20, SpO2 95.00%. Physical Exam  Constitutional: She is oriented to person, place, and time. She appears well-developed and well-nourished.  Cardiovascular: Normal rate and regular rhythm.   Respiratory: Effort normal.  Few scatt exp wheezes  GI: Soft. Bowel sounds are normal. There is no tenderness.  Musculoskeletal: Normal range of motion. She exhibits no edema.  Mod lower back paravertebral tenderness  Neurological: She is alert and oriented to person, place, and time.     Assessment/Plan Pt with hx of monoclonal gammopathy suspicious for multiple myeloma. Plan is for CT guided bone marrow biopsy today. Details/risks of procedure d/w pt/husband with their understanding and consent.  Crystallee Werden,D KEVIN 02/26/2013, 10:16 AM

## 2013-02-26 NOTE — Procedures (Signed)
Technically successful CT guided bone marrow aspiration and biopsy of left iliac crest. No immediate complications. Awaiting pathology report.    

## 2013-03-05 ENCOUNTER — Encounter: Payer: Self-pay | Admitting: Internal Medicine

## 2013-03-05 ENCOUNTER — Ambulatory Visit (HOSPITAL_BASED_OUTPATIENT_CLINIC_OR_DEPARTMENT_OTHER): Payer: Medicare Other | Admitting: Internal Medicine

## 2013-03-05 VITALS — BP 139/69 | HR 99 | Temp 97.6°F | Resp 20 | Ht 65.0 in | Wt 157.7 lb

## 2013-03-05 DIAGNOSIS — D472 Monoclonal gammopathy: Secondary | ICD-10-CM

## 2013-03-05 NOTE — Patient Instructions (Signed)
No clear evidence for multiple myeloma based on the recent studies. Followup visit in 6 months with repeat myeloma panel.

## 2013-03-05 NOTE — Progress Notes (Signed)
[facility] Telephone:([phone]) [phone]   [phone]  OFFICE PROGRESS NOTE  [name] Corwin, MD [address] [place] [address]  DIAGNOSIS: Monoclonal gammopathy of undetermined significance.  PRIOR THERAPY: None  CURRENT THERAPY: Observation.  INTERVAL HISTORY: [name] [name] 77 y.o. female returns to the clinic today for follow up visit accompanied by her husband. The patient continues to complain of pain at the lower back and right hip. She was seen recently for evaluation of monoclonal paraproteinemia and she underwent several studies including myeloma panel as well as a bone marrow biopsy and aspirate. The patient is here today for evaluation and discussion of her lab and biopsy results. She denied having any other significant complaints. She denied having any significant weight loss or night sweats. The patient denied having any chest pain, shortness of breath, cough or hemoptysis.  MEDICAL HISTORY: Past Medical History  Diagnosis Date  . CAD (coronary artery disease)     LHC 2003 with 40% pLAD, 30% mLAD, 100% D1 (moderate vessel), 100%  D2 (small vessel), 95% mid small OM2.  Pt had PTCA to D1;  D2 and OM2 small vessels and tx medically;  Last Myoview (5/09):  EF 66%, anteroseptal/apical infarct with minimal peri-infarct ischemia.  No significant change from 11/06. Pt managed medically  . Recurrent aspiration bronchitis/pneumonia     PFTs 12/09 FVC 97%, FEV1 101%, ratio 77%, TLC 109%. No significant obstruction or restriction.   . Hypertension   . Hyperlipidemia   . Esophageal stricture   . Diverticulosis of colon   . Gastroesophageal reflux disease with hiatal hernia   . Iron deficiency anemia   . Gastritis   . GERD (gastroesophageal reflux disease)   . Hiatal hernia   . Asthma   . Blood transfusion   . Cough   . Wheezing   . Weakness   . Trouble swallowing   . Abdominal pain   . Hiatal hernia   . Shortness of breath      ALLERGIES:  is allergic to aspartame and daliresp.  MEDICATIONS:  Current Outpatient Prescriptions  Medication Sig Dispense Refill  . Aclidinium Bromide (TUDORZA PRESSAIR) 400 MCG/ACT AEPB Inhale 1 puff into the lungs 2 (two) times daily.      [name] ALPRAZolam (XANAX) 0.5 MG tablet Take 0.5 mg by mouth 3 (three) times daily as needed for sleep or anxiety.       [name] amLODipine (NORVASC) 10 MG tablet Take 10 mg by mouth every morning.       [name] azithromycin (ZITHROMAX) 250 MG tablet Take 250 mg by mouth daily.      . calcitonin, salmon, (MIACALCIN/FORTICAL) 200 UNIT/ACT nasal spray Place 1 spray into the nose every morning. She alternates nostrils every other day.      . Cholecalciferol (VITAMIN D3) 2000 UNITS TABS Take 2,000-4,000 Units by mouth 2 (two) times daily. She takes two tablets in the morning and one tablet at bedtime.      . Cyanocobalamin (VITAMIN B 12 PO) Take 1 tablet by mouth every morning.       . gabapentin (NEURONTIN) 300 MG capsule Take 300 mg by mouth at bedtime as needed (For pain.).       [name] hydrochlorothiazide 25 MG tablet Take 25 mg by mouth daily with breakfast.       . HYDROcodone-acetaminophen (NORCO) 7.5-325 MG per tablet Take 1 tablet by mouth every 3 (three) hours as needed for pain.      [name] losartan (  COZAAR) 100 MG tablet Take 100 mg by mouth every morning.       . metoprolol tartrate (LOPRESSOR) 25 MG tablet Take 12.5 mg by mouth at bedtime.      . mometasone-formoterol (DULERA) 200-5 MCG/ACT AERO Inhale 2 puffs into the lungs 2 (two) times daily.      . Multiple Vitamin (MULTIVITAMIN WITH MINERALS) TABS Take 1 tablet by mouth every morning.       [name] OVER THE COUNTER MEDICATION Take 1,000 mg by mouth every other day. Coral Calcium 1000mg       . OVER THE COUNTER MEDICATION Take 400 mg by mouth 3 (three) times daily. Magnesium Carbonate 400mg       . pantoprazole (PROTONIX) 40 MG tablet Take 40 mg by mouth 2 (two) times daily.      . pravastatin (PRAVACHOL) 40 MG tablet  Take 40 mg by mouth at bedtime.       . metoCLOPramide (REGLAN) 10 MG tablet Take 1 tablet (10 mg total) by mouth every 6 (six) hours as needed (Nausea).  20 tablet  0  . promethazine (PHENERGAN) 12.5 MG suppository Place 1 suppository (12.5 mg total) rectally every 6 (six) hours as needed for nausea.  12 each  0  . promethazine (PHENERGAN) 12.5 MG tablet Take 1 tablet (12.5 mg total) by mouth every 6 (six) hours as needed for nausea.  12 tablet  0   No current facility-administered medications for this visit.    SURGICAL HISTORY:  Past Surgical History  Procedure Laterality Date  . Back surgery    . Cholecystectomy    . Bladder repair    . Hemorrhoid surgery    . Cholecystectomy      Patient unsure of date.  [name] Appendectomy      patient unsure of date  . Total abdominal hysterectomy  1975    partial  . Coronary angioplasty  11/16/2001  . Eye surgery  11/2000    bilateral cataracts with lens implant  . Hiatal hernia repair  06/03/2011    Procedure: LAPAROSCOPIC REPAIR OF HIATAL HERNIA;  Surgeon: Ernestene Mention, MD;  Location: [facility];  Service: General;  Laterality: N/A;  . Laparoscopic nissen fundoplication  06/03/2011    Procedure: LAPAROSCOPIC NISSEN FUNDOPLICATION;  Surgeon: Ernestene Mention, MD;  Location: [facility];  Service: General;  Laterality: N/A;  . Lumbar laminectomy/decompression microdiscectomy Right 06/26/2012    Procedure: LUMBAR LAMINECTOMY/DECOMPRESSION MICRODISCECTOMY;  Surgeon: Kerrin Champagne, MD;  Location: [facility] OR;  Service: Orthopedics;  Laterality: Right;  RIGHT L4-5 MICRODISCECTOMY    REVIEW OF SYSTEMS:  Constitutional: positive for fatigue Eyes: negative Ears, nose, mouth, throat, and face: negative Respiratory: negative Cardiovascular: negative Gastrointestinal: negative Genitourinary:negative Integument/breast: negative Hematologic/lymphatic: negative Musculoskeletal:positive for back pain Neurological: negative Behavioral/Psych: negative Endocrine:  negative Allergic/Immunologic: negative   PHYSICAL EXAMINATION: General appearance: alert, cooperative, fatigued and no distress Head: Normocephalic, without obvious abnormality, atraumatic Neck: no adenopathy, no JVD, supple, symmetrical, trachea midline and thyroid not enlarged, symmetric, no tenderness/mass/nodules Lymph nodes: Cervical, supraclavicular, and axillary nodes normal. Resp: clear to auscultation bilaterally Back: symmetric, no curvature. ROM normal. No CVA tenderness. Cardio: regular rate and rhythm, S1, S2 normal, no murmur, click, rub or gallop GI: soft, non-tender; bowel sounds normal; no masses,  no organomegaly Extremities: extremities normal, atraumatic, no cyanosis or edema Neurologic: Alert and oriented X 3, normal strength and tone. Normal symmetric reflexes. Normal coordination and gait  ECOG PERFORMANCE STATUS: 1 - Symptomatic but completely ambulatory  Blood pressure  139/69, pulse 99, temperature 97.6 F (36.4 C), temperature source Oral, resp. rate 20, height 5\' 5"  (1.651 m), weight 157 lb 11.2 oz (71.532 kg).  LABORATORY DATA: Lab Results  Component Value Date   WBC 5.9 02/26/2013   HGB 13.8 02/26/2013   HCT 40.9 02/26/2013   MCV 87.6 02/26/2013   PLT 415* 02/26/2013      Chemistry      Component Value Date/Time   NA 139 02/19/2013 1346   NA 143 02/12/2013 1131   K 2.8* 02/19/2013 1346   K 3.4* 02/12/2013 1131   CL 102 02/12/2013 1131   CO2 24 02/19/2013 1346   CO2 22 02/12/2013 1131   BUN 12.6 02/19/2013 1346   BUN 9 02/12/2013 1131   CREATININE 1.0 02/19/2013 1346   CREATININE 0.85 02/12/2013 1131      Component Value Date/Time   CALCIUM 10.0 02/19/2013 1346   CALCIUM 9.8 02/12/2013 1131   ALKPHOS 87 02/19/2013 1346   ALKPHOS 92 02/12/2013 1131   AST 12 02/19/2013 1346   AST 19 02/12/2013 1131   ALT 11 02/19/2013 1346   ALT 13 02/12/2013 1131   BILITOT 0.74 02/19/2013 1346   BILITOT 0.5 02/12/2013 1131       RADIOGRAPHIC STUDIES: Ct  Abdomen Pelvis W Contrast  02/11/2013   CLINICAL DATA:  Nausea and vomiting. Abdominal pain and soreness. Chills. Right hip pain.  EXAM: CT ABDOMEN AND PELVIS WITH CONTRAST  TECHNIQUE: Multidetector CT imaging of the abdomen and pelvis was performed using the standard protocol following bolus administration of intravenous contrast.  CONTRAST:  OMNIPAQUE IOHEXOL 300 MG/ML  SOLN  COMPARISON:  CT abdomen and pelvis 06/20/2012. MRI lumbar spine 12/11/2012. MRI right hip 0 8/0 30/2014.  FINDINGS: There is some dependent atelectasis in lung bases. The patient is status post Nissen fundoplication with a small hiatal hernia unchanged. No pleural or pericardial effusion.  The patient is status post cholecystectomy. The liver, biliary tree, adrenal glands, spleen, pancreas and right kidney all appear normal. A 0.6 cm low attenuating lesion in the lower pole left kidney is compatible with a cyst.  The patient is status post hysterectomy. Urinary bladder is unremarkable. The stomach and small and large bowel appear normal. There is no lymphadenopathy or fluid.  Bones demonstrate compression fractures of L1, L2 and L3 as seen on the comparison MRI. Bilateral sacral a ala fractures are also identified as seen on MRI. Avascular necrosis right femoral head and right parasymphyseal pubic bone fracture as seen on prior MRI.  IMPRESSION: No acute finding.  No change in a hiatal hernia with the patient status post fundoplication.  L1, L2 and L3 compression fractures, bilateral sacral ala fractures and right parasymphyseal pubic bone fracture as seen on recent MRI scans.   Electronically Signed   By: Drusilla Kanner M.D.   On: 02/11/2013 11:51   Ct Biopsy  02/26/2013   *RADIOLOGY REPORT*  Indication: Multiple myeloma  CT GUIDED LEFT ILIAC BONE MARROW ASPIRATION AND BONE MARROW CORE BIOPSIES  Intravenous medications: Fentanyl 150 mcg IV; Versed 3 mg IV  Sedation time: 10 minutes  Contrast volume: None  Complications: None  immediate  PROCEDURE/FINDINGS:  Informed consent was obtained from the patient following an explanation of the procedure, risks, benefits and alternatives. The patient understands, agrees and consents for the procedure. All questions were addressed.  A time out was performed prior to the initiation of the procedure.  The patient was positioned prone and noncontrast localization CT was  performed of the pelvis to demonstrate the iliac marrow spaces.  The operative site was prepped and draped in the usual sterile fashion.  Under sterile conditions and local anesthesia, an 11 gauge coaxial bone biopsy needle was advanced into the left iliac marrow space. Needle position was confirmed with CT imaging.  Initially, bone marrow aspiration was performed. Next, a bone marrow biopsy was obtained with the 11 gauge outer bone marrow device.  Samples were prepared with the cytotechnologist and deemed adequate.  The needle was removed intact.  Hemostasis was obtained with compression and a dressing was placed. The patient tolerated the procedure well without immediate post procedural complication.  IMPRESSION:  Successful CT guided left iliac bone marrow aspiration and core biopsies.   Original Report Authenticated By: Tacey Ruiz, MD   Dg Abd Acute W/chest  02/12/2013   CLINICAL DATA:  Upper abdominal pain with nausea and vomiting.  EXAM: ACUTE ABDOMEN SERIES (ABDOMEN 2 VIEW & CHEST 1 VIEW)  COMPARISON:  CT scan dated 02/11/2013 and chest x-ray dated 06/25/2012  FINDINGS: There is no evidence of dilated bowel loops or free intraperitoneal air. No radiopaque calculi or other significant radiographic abnormality is seen. Oral contrast is seen in the right side of the colon from the recent abdominal CT scan. Heart size and mediastinal contours are within normal limits. Both lungs are clear.  Again noted are multiple spine fractures, appearing unchanged.  IMPRESSION: No acute abnormality in the abdomen. No acute cardiopulmonary  disease.   Electronically Signed   By: Geanie Cooley M.D.   On: 02/12/2013 13:27   Dg Bone Survey Met  02/26/2013   CLINICAL DATA:  Multiple myeloma.  EXAM: METASTATIC BONE SURVEY  COMPARISON:  Multiple exams, including 11/19/2008  FINDINGS: Calvarial thickening noted, but with the without well-defined focal myeloma lesions in the calvarium.  Known compression fractures at L1 and L3. L4 and L5 laminectomies. Anterolisthesis at L4-5.  Suspected hiatal hernia.  IMPRESSION: 1. No focal myelomatous lesions are identified. 2. Known compression fractures at L1 and L3. 3. Hiatal hernia.   Electronically Signed   By: Herbie Baltimore M.D.   On: 02/26/2013 09:15   BONE MARROW REPORT FINAL DIAGNOSIS Diagnosis Bone Marrow, Aspirate,Biopsy, and Clot, left iliac - HYPERCELLULAR BONE MARROW FOR AGE WITH TRILINEAGE HEMATOPOIESIS AND 3% PLASMA CELLS. - SEE COMMENT. PERIPHERAL BLOOD: - NO SIGNIFICANT MORPHOLOGIC ABNORMALITIES. Diagnosis Note The bone marrow is hypercellular for age but with orderly and progressive maturation of all myeloid cell lines. Plasma cells represent 3% of all cells with lack of large aggregates or sheets. Immunohistochemical stains for kappa and lambda light chains show polyclonal staining pattern in plasma cells. The findings are nonspecific and certainly non-diagnostic of a plasma cell neoplasm. Clinical correlation is recommended. (BNS:caf 02/27/13) (BNS:caf 02/28/13) Guerry Bruin MD Pathologist, Electronic Signature (Case signed 02/28/2013)  ASSESSMENT AND PLAN: this is a very pleasant 77 years old white female recently diagnosed with monoclonal gammopathy of undetermined significance. The patient underwent several studies recently including myeloma panel, skeletal bone survey as well as a bone marrow biopsy and aspirate that showed no clear evidence for multiple myeloma. I have a lengthy discussion with the patient and her husband today about her current condition. I  recommended for her to continue on observation. I would see her back for follow up visit in 6 months with repeat myeloma panel. For the back pain as well as right hip pain the patient was advised to see Dr. Otelia Sergeant for evaluation of her  condition. The patient was advised to call immediately if she has any concerning symptoms in the interval. The patient voices understanding of current disease status and treatment options and is in agreement with the current care plan.  All questions were answered. The patient knows to call the clinic with any problems, questions or concerns. We can certainly see the patient much sooner if necessary.  I spent 15 minutes counseling the patient face to face. The total time spent in the appointment was 25 minutes.

## 2013-03-07 ENCOUNTER — Telehealth: Payer: Self-pay | Admitting: Internal Medicine

## 2013-03-07 LAB — CHROMOSOME ANALYSIS, BONE MARROW

## 2013-03-07 NOTE — Telephone Encounter (Signed)
s.w. pt and advised on April 2015 appt....pt ok and awre

## 2013-03-12 ENCOUNTER — Observation Stay (HOSPITAL_COMMUNITY): Payer: Medicare Other

## 2013-03-12 ENCOUNTER — Inpatient Hospital Stay (HOSPITAL_COMMUNITY)
Admission: EM | Admit: 2013-03-12 | Discharge: 2013-03-16 | DRG: 394 | Disposition: A | Discharge status: Home / Self Care | Payer: Medicare Other | Attending: Internal Medicine | Admitting: Internal Medicine

## 2013-03-12 ENCOUNTER — Encounter (HOSPITAL_COMMUNITY): Payer: Self-pay | Admitting: Emergency Medicine

## 2013-03-12 ENCOUNTER — Emergency Department (HOSPITAL_COMMUNITY): Payer: Medicare Other

## 2013-03-12 DIAGNOSIS — F329 Major depressive disorder, single episode, unspecified: Secondary | ICD-10-CM | POA: Diagnosis present

## 2013-03-12 DIAGNOSIS — G8929 Other chronic pain: Secondary | ICD-10-CM | POA: Diagnosis present

## 2013-03-12 DIAGNOSIS — I200 Unstable angina: Secondary | ICD-10-CM

## 2013-03-12 DIAGNOSIS — Y838 Other surgical procedures as the cause of abnormal reaction of the patient, or of later complication, without mention of misadventure at the time of the procedure: Secondary | ICD-10-CM | POA: Diagnosis present

## 2013-03-12 DIAGNOSIS — F411 Generalized anxiety disorder: Secondary | ICD-10-CM | POA: Diagnosis present

## 2013-03-12 DIAGNOSIS — K222 Esophageal obstruction: Secondary | ICD-10-CM

## 2013-03-12 DIAGNOSIS — R079 Chest pain, unspecified: Secondary | ICD-10-CM

## 2013-03-12 DIAGNOSIS — D472 Monoclonal gammopathy: Secondary | ICD-10-CM

## 2013-03-12 DIAGNOSIS — I472 Ventricular tachycardia, unspecified: Secondary | ICD-10-CM | POA: Diagnosis present

## 2013-03-12 DIAGNOSIS — F3289 Other specified depressive episodes: Secondary | ICD-10-CM | POA: Diagnosis present

## 2013-03-12 DIAGNOSIS — E785 Hyperlipidemia, unspecified: Secondary | ICD-10-CM

## 2013-03-12 DIAGNOSIS — Z9861 Coronary angioplasty status: Secondary | ICD-10-CM

## 2013-03-12 DIAGNOSIS — I10 Essential (primary) hypertension: Secondary | ICD-10-CM

## 2013-03-12 DIAGNOSIS — J45909 Unspecified asthma, uncomplicated: Secondary | ICD-10-CM

## 2013-03-12 DIAGNOSIS — K449 Diaphragmatic hernia without obstruction or gangrene: Secondary | ICD-10-CM

## 2013-03-12 DIAGNOSIS — I249 Acute ischemic heart disease, unspecified: Secondary | ICD-10-CM

## 2013-03-12 DIAGNOSIS — I498 Other specified cardiac arrhythmias: Secondary | ICD-10-CM | POA: Diagnosis present

## 2013-03-12 DIAGNOSIS — M5126 Other intervertebral disc displacement, lumbar region: Secondary | ICD-10-CM

## 2013-03-12 DIAGNOSIS — B3781 Candidal esophagitis: Secondary | ICD-10-CM | POA: Diagnosis present

## 2013-03-12 DIAGNOSIS — Z862 Personal history of diseases of the blood and blood-forming organs and certain disorders involving the immune mechanism: Secondary | ICD-10-CM

## 2013-03-12 DIAGNOSIS — Z9889 Other specified postprocedural states: Secondary | ICD-10-CM

## 2013-03-12 DIAGNOSIS — R0602 Shortness of breath: Secondary | ICD-10-CM

## 2013-03-12 DIAGNOSIS — K2970 Gastritis, unspecified, without bleeding: Secondary | ICD-10-CM

## 2013-03-12 DIAGNOSIS — K224 Dyskinesia of esophagus: Secondary | ICD-10-CM | POA: Diagnosis present

## 2013-03-12 DIAGNOSIS — I2510 Atherosclerotic heart disease of native coronary artery without angina pectoris: Secondary | ICD-10-CM

## 2013-03-12 DIAGNOSIS — K219 Gastro-esophageal reflux disease without esophagitis: Secondary | ICD-10-CM | POA: Diagnosis present

## 2013-03-12 DIAGNOSIS — Z800 Family history of malignant neoplasm of digestive organs: Secondary | ICD-10-CM

## 2013-03-12 DIAGNOSIS — J209 Acute bronchitis, unspecified: Secondary | ICD-10-CM

## 2013-03-12 DIAGNOSIS — R072 Precordial pain: Secondary | ICD-10-CM

## 2013-03-12 DIAGNOSIS — K929 Disease of digestive system, unspecified: Principal | ICD-10-CM | POA: Diagnosis present

## 2013-03-12 DIAGNOSIS — R112 Nausea with vomiting, unspecified: Secondary | ICD-10-CM

## 2013-03-12 DIAGNOSIS — R1310 Dysphagia, unspecified: Secondary | ICD-10-CM | POA: Diagnosis present

## 2013-03-12 DIAGNOSIS — K5730 Diverticulosis of large intestine without perforation or abscess without bleeding: Secondary | ICD-10-CM

## 2013-03-12 DIAGNOSIS — I4729 Other ventricular tachycardia: Secondary | ICD-10-CM | POA: Diagnosis present

## 2013-03-12 LAB — CBC WITH DIFFERENTIAL/PLATELET
Basophils Relative: 1 % (ref 0–1)
Basophils Relative: 1 % (ref 0–1)
Eosinophils Absolute: 0.1 10*3/uL (ref 0.0–0.7)
HCT: 39.3 % (ref 36.0–46.0)
Hemoglobin: 13.6 g/dL (ref 12.0–15.0)
Lymphs Abs: 2.1 10*3/uL (ref 0.7–4.0)
MCH: 29.6 pg (ref 26.0–34.0)
MCH: 29.2 pg (ref 26.0–34.0)
MCHC: 34.6 g/dL (ref 30.0–36.0)
MCHC: 34.3 g/dL (ref 30.0–36.0)
Monocytes Absolute: 0.8 10*3/uL (ref 0.1–1.0)
Monocytes Relative: 12 % (ref 3–12)
Neutro Abs: 3.9 10*3/uL (ref 1.7–7.7)
Neutro Abs: 2.9 10*3/uL (ref 1.7–7.7)
Neutrophils Relative %: 57 % (ref 43–77)
Neutrophils Relative %: 48 % (ref 43–77)
Platelets: 374 10*3/uL (ref 150–400)
Platelets: 372 10*3/uL (ref 150–400)
RBC: 4.6 MIL/uL (ref 3.87–5.11)
RDW: 14.2 % (ref 11.5–15.5)
WBC: 7 10*3/uL (ref 4.0–10.5)

## 2013-03-12 LAB — LIPASE, BLOOD: Lipase: 12 U/L (ref 11–59)

## 2013-03-12 LAB — TROPONIN I
Troponin I: <0.3 ng/mL (ref <0.30)
Troponin I: <0.3 ng/mL (ref <0.30)

## 2013-03-12 LAB — LACTIC ACID, PLASMA: Lactic Acid, Venous: 2.5 mmol/L — ABNORMAL HIGH (ref 0.5–2.2)

## 2013-03-12 LAB — HEPATIC FUNCTION PANEL
ALT: 12 U/L (ref 0–35)
AST: 17 U/L (ref 0–37)
Bilirubin, Direct: 0.2 mg/dL (ref 0.0–0.3)
Indirect Bilirubin: 0.6 mg/dL (ref 0.3–0.9)
Total Bilirubin: 0.8 mg/dL (ref 0.3–1.2)

## 2013-03-12 LAB — BASIC METABOLIC PANEL
BUN: 12 mg/dL (ref 6–23)
CO2: 24 mEq/L (ref 19–32)
Calcium: 9.6 mg/dL (ref 8.4–10.5)
Creatinine, Ser: 0.8 mg/dL (ref 0.50–1.10)
Creatinine, Ser: 0.8 mg/dL (ref 0.50–1.10)
GFR calc Af Amer: 82 mL/min — ABNORMAL LOW (ref >90)
GFR calc Af Amer: 82 mL/min — ABNORMAL LOW (ref >90)
GFR calc non Af Amer: 70 mL/min — ABNORMAL LOW (ref >90)
Glucose, Bld: 125 mg/dL — ABNORMAL HIGH (ref 70–99)
Glucose, Bld: 117 mg/dL — ABNORMAL HIGH (ref 70–99)
Potassium: 3.1 mEq/L — ABNORMAL LOW (ref 3.5–5.1)
Potassium: 3.9 mEq/L (ref 3.5–5.1)
Sodium: 137 mEq/L (ref 135–145)

## 2013-03-12 LAB — POCT I-STAT TROPONIN I
Troponin i, poc: 0 ng/mL (ref 0.00–0.08)
Troponin i, poc: 0 ng/mL (ref 0.00–0.08)

## 2013-03-12 LAB — URINALYSIS, ROUTINE W REFLEX MICROSCOPIC
Glucose, UA: NEGATIVE mg/dL
Hgb urine dipstick: NEGATIVE
pH: 6.5 (ref 5.0–8.0)

## 2013-03-12 LAB — URINE MICROSCOPIC-ADD ON

## 2013-03-12 LAB — D-DIMER, QUANTITATIVE: D-Dimer, Quant: 1.21 ug/mL-FEU — ABNORMAL HIGH (ref 0.00–0.48)

## 2013-03-12 MED ORDER — POTASSIUM CHLORIDE CRYS ER 20 MEQ PO TBCR
40.0000 meq | EXTENDED_RELEASE_TABLET | Freq: Once | ORAL | Status: AC
  Administered 2013-03-12: 40 meq via ORAL
  Filled 2013-03-12: qty 2

## 2013-03-12 MED ORDER — GI COCKTAIL ~~LOC~~
30.0000 mL | ORAL | Status: AC
  Administered 2013-03-12: 30 mL via ORAL
  Filled 2013-03-12: qty 30

## 2013-03-12 MED ORDER — ALPRAZOLAM 0.25 MG PO TABS
0.5000 mg | ORAL_TABLET | Freq: Three times a day (TID) | ORAL | Status: DC | PRN
  Administered 2013-03-13 – 2013-03-15 (×5): 0.5 mg via ORAL
  Filled 2013-03-12 (×6): qty 2

## 2013-03-12 MED ORDER — PROMETHAZINE HCL 25 MG PO TABS
12.5000 mg | ORAL_TABLET | Freq: Four times a day (QID) | ORAL | Status: DC | PRN
  Administered 2013-03-12 – 2013-03-14 (×2): 12.5 mg via ORAL
  Filled 2013-03-12 (×2): qty 1

## 2013-03-12 MED ORDER — METOPROLOL TARTRATE 12.5 MG HALF TABLET
12.5000 mg | ORAL_TABLET | Freq: Every day | ORAL | Status: DC
  Administered 2013-03-12 – 2013-03-15 (×4): 12.5 mg via ORAL
  Filled 2013-03-12 (×5): qty 1

## 2013-03-12 MED ORDER — PANTOPRAZOLE SODIUM 40 MG PO TBEC
40.0000 mg | DELAYED_RELEASE_TABLET | Freq: Two times a day (BID) | ORAL | Status: DC
  Administered 2013-03-12 – 2013-03-16 (×9): 40 mg via ORAL
  Filled 2013-03-12 (×9): qty 1

## 2013-03-12 MED ORDER — MORPHINE SULFATE 2 MG/ML IJ SOLN
2.0000 mg | Freq: Once | INTRAMUSCULAR | Status: AC
  Administered 2013-03-12: 2 mg via INTRAVENOUS
  Filled 2013-03-12: qty 1

## 2013-03-12 MED ORDER — ENOXAPARIN SODIUM 40 MG/0.4ML ~~LOC~~ SOLN
40.0000 mg | SUBCUTANEOUS | Status: DC
  Filled 2013-03-12 (×2): qty 0.4

## 2013-03-12 MED ORDER — ADULT MULTIVITAMIN W/MINERALS CH
1.0000 | ORAL_TABLET | Freq: Every morning | ORAL | Status: DC
  Administered 2013-03-12 – 2013-03-16 (×5): 1 via ORAL
  Filled 2013-03-12 (×5): qty 1

## 2013-03-12 MED ORDER — MOMETASONE FURO-FORMOTEROL FUM 200-5 MCG/ACT IN AERO
2.0000 | INHALATION_SPRAY | Freq: Two times a day (BID) | RESPIRATORY_TRACT | Status: DC
  Administered 2013-03-12 – 2013-03-16 (×9): 2 via RESPIRATORY_TRACT
  Filled 2013-03-12: qty 8.8

## 2013-03-12 MED ORDER — ENOXAPARIN SODIUM 40 MG/0.4ML ~~LOC~~ SOLN
40.0000 mg | SUBCUTANEOUS | Status: DC
  Administered 2013-03-13 – 2013-03-16 (×4): 40 mg via SUBCUTANEOUS
  Filled 2013-03-12 (×5): qty 0.4

## 2013-03-12 MED ORDER — LOSARTAN POTASSIUM 50 MG PO TABS
100.0000 mg | ORAL_TABLET | Freq: Every morning | ORAL | Status: DC
  Administered 2013-03-12 – 2013-03-16 (×5): 100 mg via ORAL
  Filled 2013-03-12 (×5): qty 2

## 2013-03-12 MED ORDER — BOOST / RESOURCE BREEZE PO LIQD
1.0000 | Freq: Every day | ORAL | Status: DC
  Administered 2013-03-12 – 2013-03-14 (×3): 1 via ORAL

## 2013-03-12 MED ORDER — ENOXAPARIN SODIUM 80 MG/0.8ML ~~LOC~~ SOLN
70.0000 mg | Freq: Once | SUBCUTANEOUS | Status: AC
  Administered 2013-03-12: 04:00:00 70 mg via SUBCUTANEOUS
  Filled 2013-03-12: qty 0.8

## 2013-03-12 MED ORDER — NITROGLYCERIN 2 % TD OINT
1.0000 [in_us] | TOPICAL_OINTMENT | Freq: Once | TRANSDERMAL | Status: AC
  Administered 2013-03-12: 1 [in_us] via TOPICAL
  Filled 2013-03-12: qty 1

## 2013-03-12 MED ORDER — ONDANSETRON HCL 4 MG/2ML IJ SOLN
4.0000 mg | Freq: Four times a day (QID) | INTRAMUSCULAR | Status: DC | PRN
  Administered 2013-03-12 – 2013-03-14 (×4): 4 mg via INTRAVENOUS
  Filled 2013-03-12 (×5): qty 2

## 2013-03-12 MED ORDER — SODIUM CHLORIDE 0.9 % IV SOLN
INTRAVENOUS | Status: AC
  Administered 2013-03-12 (×2): via INTRAVENOUS
  Filled 2013-03-12 (×4): qty 1000

## 2013-03-12 MED ORDER — AMLODIPINE BESYLATE 10 MG PO TABS
10.0000 mg | ORAL_TABLET | Freq: Every morning | ORAL | Status: DC
  Administered 2013-03-12 – 2013-03-16 (×5): 10 mg via ORAL
  Filled 2013-03-12 (×5): qty 1

## 2013-03-12 MED ORDER — ACETAMINOPHEN 325 MG PO TABS: 650.0000 mg | ORAL_TABLET | ORAL | Status: DC | PRN

## 2013-03-12 MED ORDER — METOPROLOL TARTRATE 1 MG/ML IV SOLN
5.0000 mg | Freq: Once | INTRAVENOUS | Status: AC
  Administered 2013-03-12: 5 mg via INTRAVENOUS
  Filled 2013-03-12: qty 5

## 2013-03-12 MED ORDER — ACLIDINIUM BROMIDE 400 MCG/ACT IN AEPB: 1.0000 | INHALATION_SPRAY | Freq: Two times a day (BID) | RESPIRATORY_TRACT | Status: DC

## 2013-03-12 MED ORDER — NITROGLYCERIN 0.4 MG/HR TD PT24
0.4000 mg | MEDICATED_PATCH | Freq: Every day | TRANSDERMAL | Status: DC
  Administered 2013-03-12 – 2013-03-15 (×4): 0.4 mg via TRANSDERMAL
  Filled 2013-03-12 (×5): qty 1

## 2013-03-12 MED ORDER — VITAMIN D3 25 MCG (1000 UNIT) PO TABS
2000.0000 [IU] | ORAL_TABLET | Freq: Every day | ORAL | Status: DC
  Administered 2013-03-12 – 2013-03-15 (×4): 2000 [IU] via ORAL
  Filled 2013-03-12 (×5): qty 2

## 2013-03-12 MED ORDER — CALCITONIN (SALMON) 200 UNIT/ACT NA SOLN
1.0000 | Freq: Every morning | NASAL | Status: DC
  Administered 2013-03-12 – 2013-03-16 (×5): 1 via NASAL
  Filled 2013-03-12 (×2): qty 3.7

## 2013-03-12 MED ORDER — IOHEXOL 350 MG/ML SOLN
100.0000 mL | Freq: Once | INTRAVENOUS | Status: AC | PRN
  Administered 2013-03-12: 100 mL via INTRAVENOUS

## 2013-03-12 MED ORDER — MORPHINE SULFATE 2 MG/ML IJ SOLN: 2.0000 mg | INTRAMUSCULAR | Status: DC | PRN

## 2013-03-12 MED ORDER — PROMETHAZINE HCL 25 MG RE SUPP: 12.5000 mg | Freq: Four times a day (QID) | RECTAL | Status: DC | PRN

## 2013-03-12 MED ORDER — VITAMIN D3 50 MCG (2000 UT) PO TABS: 2000.0000 [IU] | ORAL_TABLET | Freq: Two times a day (BID) | ORAL | Status: DC

## 2013-03-12 MED ORDER — ASPIRIN EC 325 MG PO TBEC
325.0000 mg | DELAYED_RELEASE_TABLET | Freq: Every day | ORAL | Status: DC
  Administered 2013-03-12 – 2013-03-16 (×5): 325 mg via ORAL
  Filled 2013-03-12 (×5): qty 1

## 2013-03-12 MED ORDER — HYDROCODONE-ACETAMINOPHEN 7.5-325 MG PO TABS
1.0000 | ORAL_TABLET | ORAL | Status: DC | PRN
  Administered 2013-03-12 – 2013-03-15 (×2): 1 via ORAL
  Filled 2013-03-12 (×2): qty 1

## 2013-03-12 MED ORDER — SIMVASTATIN 20 MG PO TABS
20.0000 mg | ORAL_TABLET | Freq: Every day | ORAL | Status: DC
  Administered 2013-03-12 – 2013-03-15 (×4): 20 mg via ORAL
  Filled 2013-03-12 (×5): qty 1

## 2013-03-12 MED ORDER — METOCLOPRAMIDE HCL 10 MG PO TABS
10.0000 mg | ORAL_TABLET | Freq: Four times a day (QID) | ORAL | Status: DC | PRN
  Filled 2013-03-12: qty 1

## 2013-03-12 MED ORDER — TIOTROPIUM BROMIDE MONOHYDRATE 18 MCG IN CAPS
18.0000 ug | ORAL_CAPSULE | Freq: Every day | RESPIRATORY_TRACT | Status: DC
  Administered 2013-03-14 – 2013-03-16 (×3): 18 ug via RESPIRATORY_TRACT
  Filled 2013-03-12: qty 5

## 2013-03-12 MED ORDER — GABAPENTIN 300 MG PO CAPS
300.0000 mg | ORAL_CAPSULE | Freq: Every evening | ORAL | Status: DC | PRN
  Filled 2013-03-12: qty 1

## 2013-03-12 MED ORDER — VITAMIN D3 25 MCG (1000 UNIT) PO TABS
4000.0000 [IU] | ORAL_TABLET | Freq: Every day | ORAL | Status: DC
  Administered 2013-03-12 – 2013-03-16 (×5): 4000 [IU] via ORAL
  Filled 2013-03-12 (×5): qty 4

## 2013-03-12 NOTE — ED Notes (Signed)
Per EMS, pt. C/o chest pain, no diaphoresis, N/V. Pt. Alert and oriented x4. Took 324 ASA and given 3 nitro by EMS with no relief

## 2013-03-12 NOTE — Progress Notes (Signed)
[facility] PROGRESS NOTE  [name] [name] XLK:[id number] DOB: June 10, 1935 DOA: 03/12/2013 PCP: [name], MD  Assessment/Plan: 77 y.o. female with known history of CAD status post stenting, hypertension, hyperlipidemia, GERD admitted with chest pain   1. Chest pain atypical; but significant risk factors, h/o CAD PTCA; CTA: no PE -ECG, initial trop unremarkable; cont NTG, ASA, monitor on tele; c/s cardiology: may need stress test   2. CAD s/p PTCA [facility] 2003 with 40% pLAD, 30% mLAD, 100% D1 (moderate vessel), 100% D2 (small vessel), 95% mid small OM2. Pt had PTCA to D1; D2 and OM2 small vessels and tx medically; Last Myoview (5/09): EF 66%, anteroseptal/apical infarct with minimal peri-infarct ischemia. No significant change from 11/06) -cont  ASA, BB, statin, NTG  3. HTN cont home meds;   4. GERD, cont PPI   Code Status: full Family Communication: none at the bedside (indicate person spoken with, relationship, and if by phone, the number) Disposition Plan: home when ready    Consultants:  Cardiology   Procedures:  CTA  Antibiotics:  None  (indicate start date, and stop date if known)  HPI/Subjective: Alert  Objective: Filed Vitals:   03/12/13 0523  BP: 149/77  Pulse: 78  Temp: 98.4 F (36.9 C)  Resp: 16    Intake/Output Summary (Last 24 hours) at 03/12/13 0843 Last data filed at 03/12/13 0800  Gross per 24 hour  Intake    120 ml  Output      0 ml  Net    120 ml   Filed Weights   03/12/13 0345 03/12/13 0524  Weight: 69.4 kg (153 lb) 69.763 kg (153 lb 12.8 oz)    Exam:   General:  alert  Cardiovascular: s1,s2 rrr  Respiratory: CTA BL   Abdomen: soft, nt, nd   Musculoskeletal: no le edema    Data Reviewed: Basic Metabolic Panel:  Recent Labs Lab 03/12/13 0100 03/12/13 0720  NA 137 136  K 3.1* 3.9  CL 99 101  CO2 24 23  GLUCOSE 125* 117*  BUN 12 12  CREATININE 0.80 0.80  CALCIUM 10.0 9.6   Liver Function  Tests:  Recent Labs Lab 03/12/13 0720  AST 17  ALT 12  ALKPHOS 68  BILITOT 0.8  PROT 7.2  ALBUMIN 3.9    Recent Labs Lab 03/12/13 0720  LIPASE 12   No results found for this basename: AMMONIA,  in the last 168 hours CBC:  Recent Labs Lab 03/12/13 0100 03/12/13 0720  WBC 7.0 6.0  NEUTROABS 3.9 2.9  HGB 13.6 13.4  HCT 39.3 39.1  MCV 85.4 85.2  PLT 374 372   Cardiac Enzymes:  Recent Labs Lab 03/12/13 0720  TROPONINI <0.30   BNP (last 3 results) No results found for this basename: PROBNP,  in the last 8760 hours CBG: No results found for this basename: GLUCAP,  in the last 168 hours  No results found for this or any previous visit (from the past 240 hour(s)).   Studies: Dg Chest 2 View  03/12/2013   CLINICAL DATA:  Chest tightness  EXAM: CHEST  2 VIEW  COMPARISON:  Chest x-ray 06/25/2012.  FINDINGS: Lung volumes are normal. Mild eventration the right hemidiaphragm is unchanged. No consolidative airspace disease. No pleural effusions. No pneumothorax. No pulmonary nodule or mass noted. Pulmonary vasculature and the cardiomediastinal silhouette are within normal limits.  IMPRESSION: 1.  No radiographic evidence of acute cardiopulmonary disease.   Electronically Signed   By: Brayton Mars.D.  On: 03/12/2013 02:47   Ct Angio Chest Pe W/cm &/or Wo Cm  03/12/2013   CLINICAL DATA:  Chest pain.  EXAM: CT ANGIOGRAPHY CHEST WITH CONTRAST  TECHNIQUE: Multidetector CT imaging of the chest was performed using the standard protocol during bolus administration of intravenous contrast. Multiplanar CT image reconstructions including MIPs were obtained to evaluate the vascular anatomy.  CONTRAST:  OMNIPAQUE IOHEXOL 350 MG/ML SOLN  COMPARISON:  Chest CT 03/01/2011.  FINDINGS: Mediastinum: There are no filling defects within the arterial tree to suggest underlying embolism. Heart size is normal. There is no significant pericardial fluid, thickening or pericardial calcification.  There is atherosclerosis of the thoracic aorta, the great vessels of the mediastinum and the coronary arteries, including calcified atherosclerotic plaque in the left main, left anterior descending, left circumflex and right coronary arteries. Curvilinear hypoattenuation throughout the anterior apical lateral wall segments, suggestive of myocardial infarctions in the LAD and potential left circumflex coronary artery territories. No pathologically enlarged mediastinal or hilar lymph nodes. Large hiatal hernia  Lungs/Pleura: No acute consolidative airspace disease. No pleural effusions. No pneumothorax. No definite suspicious appearing pulmonary nodules or masses are identified at this.  Upper Abdomen: Unremarkable.  Musculoskeletal: There are no aggressive appearing lytic or blastic lesions noted in the visualized portions of the skeleton.  Review of the MIP images confirms the above findings.  IMPRESSION: 1. No evidence of pulmonary embolism. 2. No acute findings in the thorax to account for patient's symptoms. 3. Atherosclerosis, including left main and 3 vessel coronary artery disease. Evidence of prior myocardial infarctions, as above. 4. Large hiatal hernia.   Electronically Signed   By: Trudie Reed M.D.   On: 03/12/2013 04:31    Scheduled Meds: . amLODipine  10 mg Oral q morning - 10a  . aspirin EC  325 mg Oral Daily  . calcitonin (salmon)  1 spray Alternating Nares q morning - 10a  . cholecalciferol  4,000 Units Oral Daily   And  . cholecalciferol  2,000 Units Oral QHS  . [START ON 03/13/2013] enoxaparin (LOVENOX) injection  40 mg Subcutaneous Q24H  . losartan  100 mg Oral q morning - 10a  . metoprolol tartrate  12.5 mg Oral QHS  . mometasone-formoterol  2 puff Inhalation BID  . multivitamin with minerals  1 tablet Oral q morning - 10a  . nitroGLYCERIN  0.4 mg Transdermal Daily  . pantoprazole  40 mg Oral BID  . simvastatin  20 mg Oral q1800  . tiotropium  18 mcg Inhalation Daily    Continuous Infusions: . sodium chloride 0.9 % 1,000 mL with potassium chloride 20 mEq infusion 75 mL/hr at 03/12/13 0547    Principal Problem:   Chest pain Active Problems:   HYPERLIPIDEMIA   HYPERTENSION   CAD, UNSPECIFIED SITE    Time spent: >35 minutes     [name] [name] Sheets  [facility] Pager [phone]. If 7PM-7AM, please contact night-coverage at [email or web address], password [facility] 03/12/2013, 8:43 AM  LOS: 0 days

## 2013-03-12 NOTE — ED Notes (Signed)
Patient transported to XR. 

## 2013-03-12 NOTE — H&P (Signed)
[facility] History and Physical  [name] [name] WUJ:[id number] DOB: 07-24-1935 DOA: 03/12/2013  Referring physician: ER physician. PCP: [name], MD   Chief Complaint: Chest pain.  HPI: [name] [name] is a 77 y.o. female with known history of CAD status post stenting, hypertension, hyperlipidemia started having chest pain last night at around 11 PM and patient was writing some checks. Patient's chest pain is retrosternal pressure-like nonradiating associated with some mild shortness of breath. Patient tried 3 sublingual nitroglycerin despite which pain was persistent so patient was brought to the ER. In the ER nitroglycerin patch and morphine IV and the GI cocktail was given after which patient's pain subsided. EKG was showing sinus tachycardia. Patient's d-dimer came positive and CT angiogram of the chest has been ordered which is pending at this time. Patient the day before yesterday has been having multiple episodes of nausea and vomiting with abdominal discomfort over the epigastric area but denies any diarrhea. Her nausea vomiting improved yesterday and was able to eat. Patient denies any productive cough fever chills headache dizziness loss of consciousness.  Review of Systems: As presented in the history of presenting illness, rest negative.  Past Medical History  Diagnosis Date  . CAD (coronary artery disease)     LHC 2003 with 40% pLAD, 30% mLAD, 100% D1 (moderate vessel), 100%  D2 (small vessel), 95% mid small OM2.  Pt had PTCA to D1;  D2 and OM2 small vessels and tx medically;  Last Myoview (5/09):  EF 66%, anteroseptal/apical infarct with minimal peri-infarct ischemia.  No significant change from 11/06. Pt managed medically  . Recurrent aspiration bronchitis/pneumonia     PFTs 12/09 FVC 97%, FEV1 101%, ratio 77%, TLC 109%. No significant obstruction or restriction.   . Hypertension   . Hyperlipidemia   . Esophageal stricture   . Diverticulosis of colon   .  Gastroesophageal reflux disease with hiatal hernia   . Iron deficiency anemia   . Gastritis   . GERD (gastroesophageal reflux disease)   . Hiatal hernia   . Asthma   . Blood transfusion   . Cough   . Wheezing   . Weakness   . Trouble swallowing   . Abdominal pain   . Hiatal hernia   . Shortness of breath    Past Surgical History  Procedure Laterality Date  . Back surgery    . Cholecystectomy    . Bladder repair    . Hemorrhoid surgery    . Cholecystectomy      Patient unsure of date.  [name] Appendectomy      patient unsure of date  . Total abdominal hysterectomy  1975    partial  . Coronary angioplasty  11/16/2001  . Eye surgery  11/2000    bilateral cataracts with lens implant  . Hiatal hernia repair  06/03/2011    Procedure: LAPAROSCOPIC REPAIR OF HIATAL HERNIA;  Surgeon: Ernestene Mention, MD;  Location: [facility] ORS;  Service: General;  Laterality: N/A;  . Laparoscopic nissen fundoplication  06/03/2011    Procedure: LAPAROSCOPIC NISSEN FUNDOPLICATION;  Surgeon: Ernestene Mention, MD;  Location: [facility] ORS;  Service: General;  Laterality: N/A;  . Lumbar laminectomy/decompression microdiscectomy Right 06/26/2012    Procedure: LUMBAR LAMINECTOMY/DECOMPRESSION MICRODISCECTOMY;  Surgeon: Kerrin Champagne, MD;  Location: [facility];  Service: Orthopedics;  Laterality: Right;  RIGHT L4-5 MICRODISCECTOMY   Social History:  reports that she has never smoked. She has never used smokeless tobacco. She reports that she does not drink alcohol or use  illicit drugs. Where does patient live home. Can patient participate in ADLs? Yes.  Allergies  Allergen Reactions  . Aspartame Diarrhea and Nausea And Vomiting  . Daliresp [Roflumilast] Nausea And Vomiting    Family History:  Family History  Problem Relation Age of Onset  . Cancer Mother     colon   . Colon cancer Mother 61  . Heart disease Father 9    heart attack      Prior to Admission medications   Medication Sig Start Date End Date Taking?  Authorizing Provider  Aclidinium Bromide (TUDORZA PRESSAIR) 400 MCG/ACT AEPB Inhale 1 puff into the lungs 2 (two) times daily.   Yes Historical Provider, MD  ALPRAZolam Prudy Feeler) 0.5 MG tablet Take 0.5 mg by mouth 3 (three) times daily as needed for sleep or anxiety.    Yes Historical Provider, MD  amLODipine (NORVASC) 10 MG tablet Take 10 mg by mouth every morning.    Yes Historical Provider, MD  calcitonin, salmon, (MIACALCIN/FORTICAL) 200 UNIT/ACT nasal spray Place 1 spray into the nose every morning. She alternates nostrils every other day.   Yes Historical Provider, MD  Cholecalciferol (VITAMIN D3) 2000 UNITS TABS Take 2,000-4,000 Units by mouth 2 (two) times daily. She takes two tablets in the morning and one tablet at bedtime.   Yes Historical Provider, MD  Cyanocobalamin (VITAMIN B 12 PO) Take 1 tablet by mouth every morning.    Yes Historical Provider, MD  gabapentin (NEURONTIN) 300 MG capsule Take 300 mg by mouth at bedtime as needed (For pain.).    Yes Historical Provider, MD  hydrochlorothiazide 25 MG tablet Take 25 mg by mouth daily with breakfast.  09/11/10  Yes Historical Provider, MD  HYDROcodone-acetaminophen (NORCO) 7.5-325 MG per tablet Take 1 tablet by mouth every 3 (three) hours as needed for pain.   Yes Historical Provider, MD  losartan (COZAAR) 100 MG tablet Take 100 mg by mouth every morning.    Yes Historical Provider, MD  metoCLOPramide (REGLAN) 10 MG tablet Take 1 tablet (10 mg total) by mouth every 6 (six) hours as needed (Nausea). 02/11/13  Yes Fayrene Helper, PA-C  metoprolol tartrate (LOPRESSOR) 25 MG tablet Take 12.5 mg by mouth at bedtime.   Yes Historical Provider, MD  mometasone-formoterol (DULERA) 200-5 MCG/ACT AERO Inhale 2 puffs into the lungs 2 (two) times daily. 01/25/12  Yes Nyoka Cowden, MD  Multiple Vitamin (MULTIVITAMIN WITH MINERALS) TABS Take 1 tablet by mouth every morning.    Yes Historical Provider, MD  OVER THE COUNTER MEDICATION Take 1,000 mg by mouth every  other day. Coral Calcium 1000mg    Yes Historical Provider, MD  OVER THE COUNTER MEDICATION Take 400 mg by mouth 3 (three) times daily. Magnesium Carbonate 400mg    Yes Historical Provider, MD  pantoprazole (PROTONIX) 40 MG tablet Take 40 mg by mouth 2 (two) times daily.   Yes Historical Provider, MD  pravastatin (PRAVACHOL) 40 MG tablet Take 40 mg by mouth at bedtime.    Yes Historical Provider, MD  promethazine (PHENERGAN) 12.5 MG suppository Place 1 suppository (12.5 mg total) rectally every 6 (six) hours as needed for nausea. 02/12/13  Yes Tatyana A Kirichenko, PA-C  promethazine (PHENERGAN) 12.5 MG tablet Take 1 tablet (12.5 mg total) by mouth every 6 (six) hours as needed for nausea. 02/12/13  Yes Lottie Mussel, PA-C    Physical Exam: Filed Vitals:   03/12/13 0115 03/12/13 0130 03/12/13 0215 03/12/13 0300  BP: 136/86 133/80 134/80 123/79  Pulse: 88  84 91 75  Temp:      TempSrc:      Resp: 17 17 17 12   SpO2: 98% 98% 99% 97%     General:  Well-developed and nourished.  Eyes: Anicteric no pallor.  ENT: No discharge from the ears eyes nose mouth.  Neck: No mass felt.  Cardiovascular: S1-S2 heard.  Respiratory: No rhonchi or crepitations.  Abdomen: Soft nontender bowel sounds present.  Skin: No rash.  Musculoskeletal: No edema.  Psychiatric: Appears normal.  Neurologic: Alert awake oriented to time place and person. Moves all extremities.  Labs on Admission:  Basic Metabolic Panel:  Recent Labs Lab 03/12/13 0100  NA 137  K 3.1*  CL 99  CO2 24  GLUCOSE 125*  BUN 12  CREATININE 0.80  CALCIUM 10.0   Liver Function Tests: No results found for this basename: AST, ALT, ALKPHOS, BILITOT, PROT, ALBUMIN,  in the last 168 hours No results found for this basename: LIPASE, AMYLASE,  in the last 168 hours No results found for this basename: AMMONIA,  in the last 168 hours CBC:  Recent Labs Lab 03/12/13 0100  WBC 7.0  NEUTROABS 3.9  HGB 13.6  HCT 39.3  MCV  85.4  PLT 374   Cardiac Enzymes: No results found for this basename: CKTOTAL, CKMB, CKMBINDEX, TROPONINI,  in the last 168 hours  BNP (last 3 results) No results found for this basename: PROBNP,  in the last 8760 hours CBG: No results found for this basename: GLUCAP,  in the last 168 hours  Radiological Exams on Admission: Dg Chest 2 View  03/12/2013   CLINICAL DATA:  Chest tightness  EXAM: CHEST  2 VIEW  COMPARISON:  Chest x-ray 06/25/2012.  FINDINGS: Lung volumes are normal. Mild eventration the right hemidiaphragm is unchanged. No consolidative airspace disease. No pleural effusions. No pneumothorax. No pulmonary nodule or mass noted. Pulmonary vasculature and the cardiomediastinal silhouette are within normal limits.  IMPRESSION: 1.  No radiographic evidence of acute cardiopulmonary disease.   Electronically Signed   By: Trudie Reed M.D.   On: 03/12/2013 02:47    EKG: Independently reviewed. Sinus tachycardia.  Assessment/Plan Principal Problem:   Chest pain Active Problems:   HYPERLIPIDEMIA   HYPERTENSION   CAD, UNSPECIFIED SITE   1. Chest pain with history of CAD - presently chest pain-free. Continue nitroglycerin patch. Cycle her markers. Continue antiplatelet agents. Patient was given Lovenox full dose as patient's d-dimer was elevated and CT angiography chest is pending to rule out PE. 2. Nausea and vomiting 2 days ago - mild increase lactic acid probably from the same. Gently hydrate. Hold HCTZ. Check LFTs and lipase. Abdomen on exam appears benign. 3. Hypertension - continue present medications except for HCTZ. 4. Hyperlipidemia - continue present medications. 5. History of MGUS - per oncology.    Code Status: Full code.  Family Communication: Patient's husband at the bedside.  Disposition Plan: Admit to inpatient.    Delfino Friesen N. [facility] Pager [phone].  If 7PM-7AM, please contact night-coverage [email or web address] Password [facility] 03/12/2013,  3:33 AM

## 2013-03-12 NOTE — Progress Notes (Signed)
INITIAL NUTRITION ASSESSMENT  DOCUMENTATION CODES Per approved criteria  -Not Applicable   INTERVENTION:  Resource Breeze daily (250 kcals, 9 gm protein per 8 fl oz carton) RD to follow for nutrition care plan  NUTRITION DIAGNOSIS: Inadequate oral intake related nausea & vomiting as evidenced by patient report   Goal: Pt to meet >/= 90% of their estimated nutrition needs   Monitor:  PO & supplemental intake, weight, labs, I/O's  Reason for Assessment: Malnutrition Screening Tool Report  77 y.o. female  Admitting Dx: Chest pain  ASSESSMENT: Patient with PMH of CAD, HTN, HLD presented with chest pain; in ER nitroglycerin patch, morphine IV and GI cocktail was given after which patient's pain subsided; + multiple episodes of N/V with abdominal discomfort.   Patient reports having an episode of nausea this AM and did not eat her breakfast; reports a decreased appetite PTA (mostly drinking liquids); patient endorses weight loss of ~ 19 lbs in the past 2 months (patient fractured her hip and had limited mobility) -- severe for time frame; amenable to trying Resource -- RD to order.  Height: Ht Readings from Last 1 Encounters:  03/12/13 5\' 3"  (1.6 m)    Weight: Wt Readings from Last 1 Encounters:  03/12/13 153 lb 12.8 oz (69.763 kg)    Ideal Body Weight: 115 lb  % Ideal Body Weight: 133%  Wt Readings from Last 10 Encounters:  03/12/13 153 lb 12.8 oz (69.763 kg)  03/05/13 157 lb 11.2 oz (71.532 kg)  02/19/13 158 lb 6.4 oz (71.85 kg)  02/12/13 162 lb 8 oz (73.71 kg)  12/09/12 174 lb (78.926 kg)  06/23/12 171 lb 11.8 oz (77.9 kg)  06/23/12 171 lb 11.8 oz (77.9 kg)  06/20/12 175 lb 11.3 oz (79.7 kg)  04/02/12 166 lb 9.6 oz (75.569 kg)  02/20/12 165 lb 9.6 oz (75.116 kg)    Usual Body Weight: 174 lb  % Usual Body Weight: 87%  BMI:  Body mass index is 27.25 kg/(m^2).  Estimated Nutritional Needs: Kcal: 1700-1900 Protein: 80-90 gm Fluid: 1.7-1.9 L  Skin:  Intact  Diet Order: Carb Control  EDUCATION NEEDS: -No education needs identified at this time   Intake/Output Summary (Last 24 hours) at 03/12/13 1321 Last data filed at 03/12/13 0800  Gross per 24 hour  Intake    120 ml  Output      0 ml  Net    120 ml    Labs:   Recent Labs Lab 03/12/13 0100 03/12/13 0720  NA 137 136  K 3.1* 3.9  CL 99 101  CO2 24 23  BUN 12 12  CREATININE 0.80 0.80  CALCIUM 10.0 9.6  GLUCOSE 125* 117*    Scheduled Meds: . amLODipine  10 mg Oral q morning - 10a  . aspirin EC  325 mg Oral Daily  . calcitonin (salmon)  1 spray Alternating Nares q morning - 10a  . cholecalciferol  4,000 Units Oral Daily   And  . cholecalciferol  2,000 Units Oral QHS  . [START ON 03/13/2013] enoxaparin (LOVENOX) injection  40 mg Subcutaneous Q24H  . losartan  100 mg Oral q morning - 10a  . metoprolol tartrate  12.5 mg Oral QHS  . mometasone-formoterol  2 puff Inhalation BID  . multivitamin with minerals  1 tablet Oral q morning - 10a  . nitroGLYCERIN  0.4 mg Transdermal Daily  . pantoprazole  40 mg Oral BID  . simvastatin  20 mg Oral q1800  . tiotropium  18 mcg Inhalation Daily    Continuous Infusions: . sodium chloride 0.9 % 1,000 mL with potassium chloride 20 mEq infusion 75 mL/hr at 03/12/13 0547    Past Medical History  Diagnosis Date  . CAD (coronary artery disease)     LHC 2003 with 40% pLAD, 30% mLAD, 100% D1 (moderate vessel), 100%  D2 (small vessel), 95% mid small OM2.  Pt had PTCA to D1;  D2 and OM2 small vessels and tx medically;  Last Myoview (2012): anterior infarct seen with scar, no ischemia. EF normal. Pt managed medically  . Recurrent aspiration bronchitis/pneumonia     PFTs 12/09 FVC 97%, FEV1 101%, ratio 77%, TLC 109%. No significant obstruction or restriction.   . Hypertension   . Hyperlipidemia   . Esophageal stricture   . Diverticulosis of colon   . Gastroesophageal reflux disease with hiatal hernia   . Iron deficiency anemia   .  Gastritis   . GERD (gastroesophageal reflux disease)   . Hiatal hernia   . Asthma   . Blood transfusion   . Cough   . Wheezing   . Weakness   . Trouble swallowing   . Abdominal pain   . Hiatal hernia   . Shortness of breath     Past Surgical History  Procedure Laterality Date  . Back surgery    . Cholecystectomy    . Bladder repair    . Hemorrhoid surgery    . Cholecystectomy      Patient unsure of date.  [name] Appendectomy      patient unsure of date  . Total abdominal hysterectomy  1975    partial  . Coronary angioplasty  11/16/2001  . Eye surgery  11/2000    bilateral cataracts with lens implant  . Hiatal hernia repair  06/03/2011    Procedure: LAPAROSCOPIC REPAIR OF HIATAL HERNIA;  Surgeon: Ernestene Mention, MD;  Location: [facility]S;  Service: General;  Laterality: N/A;  . Laparoscopic nissen fundoplication  06/03/2011    Procedure: LAPAROSCOPIC NISSEN FUNDOPLICATION;  Surgeon: Ernestene Mention, MD;  Location: [facility];  Service: General;  Laterality: N/A;  . Lumbar laminectomy/decompression microdiscectomy Right 06/26/2012    Procedure: LUMBAR LAMINECTOMY/DECOMPRESSION MICRODISCECTOMY;  Surgeon: Kerrin Champagne, MD;  Location: [facility] OR;  Service: Orthopedics;  Laterality: Right;  RIGHT L4-5 MICRODISCECTOMY    Maureen Chatters, RD, LDN Pager #: [phone] After-Hours Pager #: [phone]

## 2013-03-12 NOTE — Consult Note (Signed)
CARDIOLOGY CONSULT NOTE   Patient ID: [name] [name] MRN: [id number] DOB/AGE: [birth year] 77 y.o.  Admit date: 03/12/2013  Primary Physician   [name], MD Primary Cardiologist   DM - Last seen in 2012 Reason for Consultation   Chest pain  [name] [name] is a 77 y.o. female with a history of CAD. Cath with PCI in 2003, residual coronary artery disease of a small diagonal and OM treated medically.  Lexi scan Myoview in 2012 showed scar but no ischemia, EF normal.  She had back surgery in February of 2014 and has been struggling with nausea since then. She has been to the emergency room several times and has required IV fluids at times because her by mouth intake has been poor. With all of this and struggling to cope with the back pain since the surgery, she has not had any chest pain.  About 11 PM yesterday, she was sitting still when she had sudden onset of substernal chest pressure and tightness. He reached an 8/10. She was nauseated as per usual for her but feels the nausea got a little worse. She also felt a little short of breath and was diaphoretic. She did not try any medications at home. When her symptoms did not resolve, EMS was called. She received sublingual nitroglycerin x3 on the way to the hospital.  With the ER, her chest pain had decreased slightly to a 7/10. In the emergency room, she received nitroglycerin paste and morphine. The morphine reduce the pain a little more and it was a 6/10. She states she was given something to help her sleep and got some rest finally about 6 AM. When she woke up, the pain was almost completely gone but she still feels a little tight. She has not had any symptoms like this recently.  Since her back surgery, her activity has been significantly limited. She does not exercise at all and physical therapy and did. She walks around the house but is unable to do any lifting or significant exertion. She has not had any exertional  symptoms. The nausea at this every day and is constant. She feels that she is eating very poorly. She does not vomit very often. She had a Nissen fundoplication in January 2013 and has not had a lot of GI symptoms until her back surgery in February 2014.  She has occasional chills but has not documented any fever. She'll also feel hot at times. She has been coughing more than usual and describes occasional dark yellowish sputum. She has not had any blood in the vomitus or in her bowel movements and no melena. With the nausea and poor by mouth intake, her weight is down10 kg since her surgery in February.   Past Medical History  Diagnosis Date  . CAD (coronary artery disease)     LHC 2003 with 40% pLAD, 30% mLAD, 100% D1 (moderate vessel), 100%  D2 (small vessel), 95% mid small OM2.  Pt had PTCA to D1;  D2 and OM2 small vessels and tx medically;  Last Myoview (2012): anterior infarct seen with scar, no ischemia. EF normal. Pt managed medically  . Recurrent aspiration bronchitis/pneumonia     PFTs 12/09 FVC 97%, FEV1 101%, ratio 77%, TLC 109%. No significant obstruction or restriction.   . Hypertension   . Hyperlipidemia   . Esophageal stricture   . Diverticulosis of colon   . Gastroesophageal reflux disease with hiatal hernia   . Iron deficiency anemia   .  Gastritis   . GERD (gastroesophageal reflux disease)   . Hiatal hernia   . Asthma   . Blood transfusion   . Cough   . Wheezing   . Weakness   . Trouble swallowing   . Abdominal pain   . Hiatal hernia   . Shortness of breath      Past Surgical History  Procedure Laterality Date  . Back surgery    . Cholecystectomy    . Bladder repair    . Hemorrhoid surgery    . Cholecystectomy      Patient unsure of date.  [name] Appendectomy      patient unsure of date  . Total abdominal hysterectomy  1975    partial  . Coronary angioplasty  11/16/2001  . Eye surgery  11/2000    bilateral cataracts with lens implant  . Hiatal hernia repair   06/03/2011    Procedure: LAPAROSCOPIC REPAIR OF HIATAL HERNIA;  Surgeon: Ernestene Mention, MD;  Location: [facility];  Service: General;  Laterality: N/A;  . Laparoscopic nissen fundoplication  06/03/2011    Procedure: LAPAROSCOPIC NISSEN FUNDOPLICATION;  Surgeon: Ernestene Mention, MD;  Location: [facility];  Service: General;  Laterality: N/A;  . Lumbar laminectomy/decompression microdiscectomy Right 06/26/2012    Procedure: LUMBAR LAMINECTOMY/DECOMPRESSION MICRODISCECTOMY;  Surgeon: Kerrin Champagne, MD;  Location: [facility] OR;  Service: Orthopedics;  Laterality: Right;  RIGHT L4-5 MICRODISCECTOMY    Allergies  Allergen Reactions  . Aspartame Diarrhea and Nausea And Vomiting  . Daliresp [Roflumilast] Nausea And Vomiting   I have reviewed the patient's current medications . amLODipine  10 mg Oral q morning - 10a  . aspirin EC  325 mg Oral Daily  . calcitonin (salmon)  1 spray Alternating Nares q morning - 10a  . cholecalciferol  4,000 Units Oral Daily   And  . cholecalciferol  2,000 Units Oral QHS  . [START ON 03/13/2013] enoxaparin (LOVENOX) injection  40 mg Subcutaneous Q24H  . losartan  100 mg Oral q morning - 10a  . metoprolol tartrate  12.5 mg Oral QHS  . mometasone-formoterol  2 puff Inhalation BID  . multivitamin with minerals  1 tablet Oral q morning - 10a  . nitroGLYCERIN  0.4 mg Transdermal Daily  . pantoprazole  40 mg Oral BID  . simvastatin  20 mg Oral q1800  . tiotropium  18 mcg Inhalation Daily   . sodium chloride 0.9 % 1,000 mL with potassium chloride 20 mEq infusion 75 mL/hr at 03/12/13 0547   acetaminophen, ALPRAZolam, gabapentin, HYDROcodone-acetaminophen, metoCLOPramide, morphine injection, ondansetron (ZOFRAN) IV, promethazine, promethazine  Prior to Admission medications   Medication Sig  Aclidinium Bromide (TUDORZA PRESSAIR) 400 MCG/ACT AEPB Inhale 1 puff into the lungs 2 (two) times daily.  ALPRAZolam (XANAX) 0.5 MG tablet Take 0.5 mg by mouth 3 (three) times daily as needed  for sleep or anxiety.   amLODipine (NORVASC) 10 MG tablet Take 10 mg by mouth every morning.   calcitonin, salmon, (MIACALCIN/FORTICAL) 200 UNIT/ACT nasal spray Place 1 spray into the nose every morning. She alternates nostrils every other day.  Cholecalciferol (VITAMIN D3) 2000 UNITS TABS Take 2,000-4,000 Units by mouth 2 (two) times daily. She takes two tablets in the morning and one tablet at bedtime.  Cyanocobalamin (VITAMIN B 12 PO) Take 1 tablet by mouth every morning.   gabapentin (NEURONTIN) 300 MG capsule Take 300 mg by mouth at bedtime as needed (For pain.).   hydrochlorothiazide 25 MG tablet Take 25 mg by mouth  daily with breakfast.   HYDROcodone-acetaminophen (NORCO) 7.5-325 MG per tablet Take 1 tablet by mouth every 3 (three) hours as needed for pain.  losartan (COZAAR) 100 MG tablet Take 100 mg by mouth every morning.   metoCLOPramide (REGLAN) 10 MG tablet Take 1 tablet (10 mg total) by mouth every 6 (six) hours as needed (Nausea).  metoprolol tartrate (LOPRESSOR) 25 MG tablet Take 12.5 mg by mouth at bedtime.  mometasone-formoterol (DULERA) 200-5 MCG/ACT AERO Inhale 2 puffs into the lungs 2 (two) times daily.  Multiple Vitamin (MULTIVITAMIN WITH MINERALS) TABS Take 1 tablet by mouth every morning.   OVER THE COUNTER MEDICATION Take 1,000 mg by mouth every other day. Coral Calcium 1000mg   OVER THE COUNTER MEDICATION Take 400 mg by mouth 3 (three) times daily. Magnesium Carbonate 400mg   pantoprazole (PROTONIX) 40 MG tablet Take 40 mg by mouth 2 (two) times daily.  pravastatin (PRAVACHOL) 40 MG tablet Take 40 mg by mouth at bedtime.   promethazine (PHENERGAN) 12.5 MG suppository Place 1 suppository (12.5 mg total) rectally every 6 (six) hours as needed for nausea.  promethazine (PHENERGAN) 12.5 MG tablet Take 1 tablet (12.5 mg total) by mouth every 6 (six) hours as needed for nausea.     History   Social History  . Marital Status: Married    Spouse Name: N/A    Number of  Children: N/A  . Years of Education: N/A   Occupational History  . Retired     worked at [employer] History Main Topics  . Smoking status: Never Smoker   . Smokeless tobacco: Never Used  . Alcohol Use: No  . Drug Use: No  . Sexual Activity: Not on file   Other Topics Concern  . Not on file   Social History Narrative   Lives with husband.    Family Status  Relation Status Death Age  . Mother Deceased   . Father Deceased    Family History  Problem Relation Age of Onset  . Cancer Mother     colon   . Colon cancer Mother 65  . Heart disease Father 54    heart attack     ROS:  Full 14 point review of systems complete and found to be negative unless listed above.  Physical Exam: Blood pressure 133/68, pulse 75, temperature 98 F (36.7 C), temperature source Oral, resp. rate 18, height 5\' 3"  (1.6 m), weight 153 lb 12.8 oz (69.763 kg), SpO2 97.00%.  General: Well developed, well nourished, female in no acute distress Head: Eyes PERRLA, No xanthomas.   Normocephalic and atraumatic, oropharynx without edema or exudate. Dentition: Good Lungs: Few bilateral rales, good air exchange, no rhonchi or wheezing noted Heart: HRRR S1 S2, no rub/gallop, no murmur. pulses are 2+ all 4 extrem.   Neck: No carotid bruits. No lymphadenopathy.  JVD not elevated. Abdomen: Bowel sounds present, abdomen soft and non-tender without masses or hernias noted. Msk:  No spine or cva tenderness. No focal weakness, no joint deformities or effusions. Extremities: No clubbing or cyanosis. No edema.  Neuro: Alert and oriented X 3. No focal deficits noted. Psych:  Good affect, responds appropriately Skin: No rashes or lesions noted.  Labs:   Lab Results  Component Value Date   WBC 6.0 03/12/2013   HGB 13.4 03/12/2013   HCT 39.1 03/12/2013   MCV 85.2 03/12/2013   PLT 372 03/12/2013     Recent Labs Lab 03/12/13 0720  NA 136  K 3.9  CL 101  CO2 23  BUN 12  CREATININE 0.80  CALCIUM  9.6  PROT 7.2  BILITOT 0.8  ALKPHOS 68  ALT 12  AST 17  GLUCOSE 117*  ALBUMIN 3.9    Recent Labs  03/12/13 0720  TROPONINI <0.30    Recent Labs  03/12/13 0215 03/12/13 0445  TROPIPOC 0.00 0.00   Lab Results  Component Value Date   DDIMER 1.21* 03/12/2013   Lipase  Date/Time Value Range Status  03/12/2013  7:20 AM 12  11 - 59 U/L Final   TSH  Date/Time Value Range Status  02/16/2010  6:27 AM 0.283* 0.350 - 4.500 uIU/mL Final     T3, Free  Date/Time Value Range Status  02/17/2010  5:55 AM 2.5  2.3 - 4.2 pg/mL Final   Cardiac cath: 11/16/2001 Left main is normal.  Left anterior descending has a diffuse 40% stenosis extending from the  proximal to mid vessel. In the mid to distal vessel, there is a diffuse 30%  stenosis. The LAD gives rise to a first diagonal branch which was fairly long but slender. This and diagonal branch is 100% occluded proximally with TIMI-2 flow into the distal vessel. This supplies the majority of the anterolateral wall. There is a smaller second diagonal branch which is also 100% occluded and fills by grade I to II left to left collaterals.  Left circumflex has diffuse minor luminal irregularities in the distal vessel  extending into a large third obtuse marginal branch. The circumflex gives rise to a small ramus intermediate, normal sized OM-1, small branching OM-2 and a large OM-3. OM-2 has a 95% stenosis in the mid vessel.  The right coronary artery is a dominant vessel. The right coronary artery  gives rise to a normal sized posterior descending artery and a normal sized posterolateral branch. The right coronary artery is normal.  IMPRESSIONS:  1. Left ventricular systolic function characterized by moderate akinesis of  the anterolateral wall but overall preserved ejection fraction.  2. Small vessel coronary artery disease involving diagonal branches and a small obtuse marginal branch. In review of these findings, the most  significant of  the diseased small branch, it appears to be the first  diagonal which does supply a large portion of the anterolateral wall. This  is where the perfusion defect was demonstrated on Cardiolite imaging. After reviewing discussion with the patient, we opted to proceed with  percutaneous intervention of the first diagonal branch. See below.  PERCUTANEOUS TRANSLUMINAL CORONARY ANGIOPLASTY PROCEDURE: Following completion  of the diagnostic catheterization, we proceeded with percutaneous coronary intervention. We utilized the preexisting 6 French sheath in the right femoral artery. Heparin and Integrilin were administered per protocol. We used a 6 [place] guiding catheter. We initially attempted to cross the lesion with a Hi-Torque Floppy wire, however, this would not cross. We then utilized a Choice PT wire, which would not cross. We again attempted to cross with the Choice PT wire utilizing backup with an angioplasty balloon. However, this again would not cross the lesion. We then changed out for a PT Graphix wire, again utilizing balloon backup. With this, we were able to successfully cross the occlusion of the first diagonal branch and advance the wire into the distal portion of the diagonal. We then advanced the balloon over the wire and carefully positioned it across the lesion. We performed one inflation to 8 atmospheres for 60 seconds. We then did a second inflation to 14 atmospheres for 3 minutes. Final angiographic  images were obtained revealing patency of the first diagonal branch with 25% residual stenosis and TIMI-3 flow.  COMPLICATIONS: None.  RESULTS: Successful percutaneous transluminal coronary angioplasty of the first diagonal branch reducing a 100% chronic total occlusion with TIMI-2 flow to 25% residual with TIMI-3 flow.  Echo: 12/20/2010 Conclusions - Left ventricle: The cavity size was normal. Wall thickness was normal. Systolic function was normal. The estimated ejection fraction was  in the range of 55% to 65%. Wall motion was normal; there were no regional wall motion abnormalities. Doppler parameters are consistent with abnormal left ventricular relaxation (grade 1 diastolic dysfunction). - Left atrium: The atrium was mildly dilated. - Atrial septum: There was increased thickness of the septum, consistent with lipomatous hypertrophy. - Pulmonary arteries: Systolic pressure was mildly increased. PA peak pressure: 35mm Hg (S). - Pericardium, extracardiac: A trivial pericardial effusion was identified. Impressions: - Atrial septum thickened most likely secondary to lipomatous hypertrophy; cannot exclude mass; suggest TEE or cardiac MRI if clinically indicated.  ECG:  03/12/2013 Sinus tachycardia Vent. rate 106 BPM PR interval 135 ms QRS duration 87 ms QT/QTc 362/481 ms P-R-T axes 50 39 -26  Radiology:  Dg Chest 2 View 03/12/2013   CLINICAL DATA:  Chest tightness  EXAM: CHEST  2 VIEW  COMPARISON:  Chest x-ray 06/25/2012.  FINDINGS: Lung volumes are normal. Mild eventration the right hemidiaphragm is unchanged. No consolidative airspace disease. No pleural effusions. No pneumothorax. No pulmonary nodule or mass noted. Pulmonary vasculature and the cardiomediastinal silhouette are within normal limits.  IMPRESSION: 1.  No radiographic evidence of acute cardiopulmonary disease.   Electronically Signed   By: Trudie Reed M.D.   On: 03/12/2013 02:47   Ct Angio Chest Pe W/cm &/or Wo Cm 03/12/2013   CLINICAL DATA:  Chest pain.  EXAM: CT ANGIOGRAPHY CHEST WITH CONTRAST  TECHNIQUE: Multidetector CT imaging of the chest was performed using the standard protocol during bolus administration of intravenous contrast. Multiplanar CT image reconstructions including MIPs were obtained to evaluate the vascular anatomy.  CONTRAST:  OMNIPAQUE IOHEXOL 350 MG/ML SOLN  COMPARISON:  Chest CT 03/01/2011.  FINDINGS: Mediastinum: There are no filling defects within the arterial tree to  suggest underlying embolism. Heart size is normal. There is no significant pericardial fluid, thickening or pericardial calcification. There is atherosclerosis of the thoracic aorta, the great vessels of the mediastinum and the coronary arteries, including calcified atherosclerotic plaque in the left main, left anterior descending, left circumflex and right coronary arteries. Curvilinear hypoattenuation throughout the anterior apical lateral wall segments, suggestive of myocardial infarctions in the LAD and potential left circumflex coronary artery territories. No pathologically enlarged mediastinal or hilar lymph nodes. Large hiatal hernia  Lungs/Pleura: No acute consolidative airspace disease. No pleural effusions. No pneumothorax. No definite suspicious appearing pulmonary nodules or masses are identified at this.  Upper Abdomen: Unremarkable.  Musculoskeletal: There are no aggressive appearing lytic or blastic lesions noted in the visualized portions of the skeleton.  Review of the MIP images confirms the above findings.  IMPRESSION: 1. No evidence of pulmonary embolism. 2. No acute findings in the thorax to account for patient's symptoms. 3. Atherosclerosis, including left main and 3 vessel coronary artery disease. Evidence of prior myocardial infarctions, as above. 4. Large hiatal hernia.   Electronically Signed   By: Trudie Reed M.D.   On: 03/12/2013 04:31    ASSESSMENT AND PLAN:   The patient was seen today by Dr. Delton See, the patient evaluated and the data reviewed.  Principal Problem:   Chest pain - symptoms were prolonged for greater than 4 hours without ST/T-wave changes or elevated cardiac enzymes. The symptoms were not clearly responsive to nitroglycerin. She has no history of recent exertional symptoms although exertion has been limited by the musculo-skeletal problems.   She has significant known coronary artery disease, last stress test 2-1/2 years ago without ischemia, no cath in greater  than 10 years. Will check echo. If new WMA, will need cath. If EF normal and no new WMA, consider OP stress test. Symptoms are not clearly GI in origin but would consider a GI evaluation as the nausea may be contributing.  Active Problems:   HYPERLIPIDEMIA - no recent profile, will order.    HYPERTENSION - BP is running a little high, her Lopressor dose at home is 12.5 mg each bedtime, consider increasing to twice a day.    CAD, UNSPECIFIED SITE - see cath report above   Signed: Theodore Demark, PA-C 03/12/2013 10:57 AM Beeper [phone]  Co-Sign MD  The patient was seen, examined and discussed with Theodore Demark, PA-C and I agree with the above.  In summary, this is  77 year old patient with known CAD, last cath in 2003 with PCI to 1. Diagonal branch and moderate diffuse residual disease in LAD, D2, OM2. He is now coming for atypical non-exertional constant chest pain with minimal improvement with NTG. Cardiac enzymes are negative, ECG is unchanged from the prior on 02/12/2013. She is now asymptomatic.  We will order an echocardiogram to evaluate for potential new wall motion abnormalities. We will also order a metoclopromide and PPI for her chronic nausea.  If there are new WMA we might consider cath, otherwise perform an outpatient stress test. (The last nuclear stress test in 2012 showed anterior scar, no ischemia, overall preserved LV EF).  Tobias Alexander, H 03/12/2013

## 2013-03-12 NOTE — ED Provider Notes (Signed)
CSN: [id number]     Arrival date & time 03/12/13  0029 History   First MD Initiated Contact with Patient 03/12/13 0122     Chief Complaint  Patient presents with  . Chest Pain   (Consider location/radiation/quality/duration/timing/severity/associated sxs/prior Treatment) HPI This patient is a 77 yo woman with a history of muli-vessel CAD on cardiac cath in 2003, asthma, GERD, HTN, hyperlipidemia and multiple co-morbidities. She presents from home via EMS after developing diffuse chest tightness with 8/10 discomfort while she was at rest at approximately 2330. She had some associated chest fluttering. He notes some associated SOB. Denies wheezing. She has had a mild, productive cough without hemoptysis. No fever. Endorses some chills tonight. Denies history of similar sx. She has some very mild chest tightness. Denies chest pain at this time.  The patient has been treated with ASA 324mg  po pta. She also received a GI cocktail in the ED which helped with CP. Patient received NTG SL x 3 en route. She thins this "may have helped a little".   Past Medical History  Diagnosis Date  . CAD (coronary artery disease)     LHC 2003 with 40% pLAD, 30% mLAD, 100% D1 (moderate vessel), 100%  D2 (small vessel), 95% mid small OM2.  Pt had PTCA to D1;  D2 and OM2 small vessels and tx medically;  Last Myoview (5/09):  EF 66%, anteroseptal/apical infarct with minimal peri-infarct ischemia.  No significant change from 11/06. Pt managed medically  . Recurrent aspiration bronchitis/pneumonia     PFTs 12/09 FVC 97%, FEV1 101%, ratio 77%, TLC 109%. No significant obstruction or restriction.   . Hypertension   . Hyperlipidemia   . Esophageal stricture   . Diverticulosis of colon   . Gastroesophageal reflux disease with hiatal hernia   . Iron deficiency anemia   . Gastritis   . GERD (gastroesophageal reflux disease)   . Hiatal hernia   . Asthma   . Blood transfusion   . Cough   . Wheezing   . Weakness   .  Trouble swallowing   . Abdominal pain   . Hiatal hernia   . Shortness of breath    Past Surgical History  Procedure Laterality Date  . Back surgery    . Cholecystectomy    . Bladder repair    . Hemorrhoid surgery    . Cholecystectomy      Patient unsure of date.  [name] Appendectomy      patient unsure of date  . Total abdominal hysterectomy  1975    partial  . Coronary angioplasty  11/16/2001  . Eye surgery  11/2000    bilateral cataracts with lens implant  . Hiatal hernia repair  06/03/2011    Procedure: LAPAROSCOPIC REPAIR OF HIATAL HERNIA;  Surgeon: Ernestene Mention, MD;  Location: [facility];  Service: General;  Laterality: N/A;  . Laparoscopic nissen fundoplication  06/03/2011    Procedure: LAPAROSCOPIC NISSEN FUNDOPLICATION;  Surgeon: Ernestene Mention, MD;  Location: [facility];  Service: General;  Laterality: N/A;  . Lumbar laminectomy/decompression microdiscectomy Right 06/26/2012    Procedure: LUMBAR LAMINECTOMY/DECOMPRESSION MICRODISCECTOMY;  Surgeon: Kerrin Champagne, MD;  Location: [facility];  Service: Orthopedics;  Laterality: Right;  RIGHT L4-5 MICRODISCECTOMY   Family History  Problem Relation Age of Onset  . Cancer Mother     colon   . Colon cancer Mother 90  . Heart disease Father 40    heart attack   History  Substance Use Topics  .  Smoking status: Never Smoker   . Smokeless tobacco: Never Used  . Alcohol Use: No   OB History   Grav Para Term Preterm Abortions TAB SAB Ect Mult Living                 Review of Systems Gen: no weight loss, fevers, chills, night sweats Eyes: no discharge or drainage, no occular pain or visual changes Nose: no epistaxis or rhinorrhea Mouth: no dental pain, no sore throat Neck: no neck pain Lungs: as per hpi, otherwise negative CV:  As per hpi, otherwise negative Abd patient says she feels like she needs to belch and pass flatus, denies abdominal pain, nausea, vomiting, diarrhea.  GU: no dysuria or gross hematuria MSK: no myalgias or  arthralgias Neuro: no headache, no focal neurologic deficits Skin: no rash Psyche: negative.  Allergies  Aspartame and Daliresp  Home Medications   Current Outpatient Rx  Name  Route  Sig  Dispense  Refill  . Aclidinium Bromide (TUDORZA PRESSAIR) 400 MCG/ACT AEPB   Inhalation   Inhale 1 puff into the lungs 2 (two) times daily.         [name] ALPRAZolam (XANAX) 0.5 MG tablet   Oral   Take 0.5 mg by mouth 3 (three) times daily as needed for sleep or anxiety.          [name] amLODipine (NORVASC) 10 MG tablet   Oral   Take 10 mg by mouth every morning.          . calcitonin, salmon, (MIACALCIN/FORTICAL) 200 UNIT/ACT nasal spray   Nasal   Place 1 spray into the nose every morning. She alternates nostrils every other day.         . Cholecalciferol (VITAMIN D3) 2000 UNITS TABS   Oral   Take 2,000-4,000 Units by mouth 2 (two) times daily. She takes two tablets in the morning and one tablet at bedtime.         . Cyanocobalamin (VITAMIN B 12 PO)   Oral   Take 1 tablet by mouth every morning.          . gabapentin (NEURONTIN) 300 MG capsule   Oral   Take 300 mg by mouth at bedtime as needed (For pain.).          [name] hydrochlorothiazide 25 MG tablet   Oral   Take 25 mg by mouth daily with breakfast.          . HYDROcodone-acetaminophen (NORCO) 7.5-325 MG per tablet   Oral   Take 1 tablet by mouth every 3 (three) hours as needed for pain.         [name] losartan (COZAAR) 100 MG tablet   Oral   Take 100 mg by mouth every morning.          . metoCLOPramide (REGLAN) 10 MG tablet   Oral   Take 1 tablet (10 mg total) by mouth every 6 (six) hours as needed (Nausea).   20 tablet   0   . metoprolol tartrate (LOPRESSOR) 25 MG tablet   Oral   Take 12.5 mg by mouth at bedtime.         . mometasone-formoterol (DULERA) 200-5 MCG/ACT AERO   Inhalation   Inhale 2 puffs into the lungs 2 (two) times daily.         . Multiple Vitamin (MULTIVITAMIN WITH MINERALS) TABS    Oral   Take 1 tablet by mouth every morning.          [name]  OVER THE COUNTER MEDICATION   Oral   Take 1,000 mg by mouth every other day. Coral Calcium 1000mg          . OVER THE COUNTER MEDICATION   Oral   Take 400 mg by mouth 3 (three) times daily. Magnesium Carbonate 400mg          . pantoprazole (PROTONIX) 40 MG tablet   Oral   Take 40 mg by mouth 2 (two) times daily.         . pravastatin (PRAVACHOL) 40 MG tablet   Oral   Take 40 mg by mouth at bedtime.          . promethazine (PHENERGAN) 12.5 MG suppository   Rectal   Place 1 suppository (12.5 mg total) rectally every 6 (six) hours as needed for nausea.   12 each   0   . promethazine (PHENERGAN) 12.5 MG tablet   Oral   Take 1 tablet (12.5 mg total) by mouth every 6 (six) hours as needed for nausea.   12 tablet   0    BP 132/77  Pulse 88  Temp(Src) 98.4 F (36.9 C) (Oral)  Resp 16  SpO2 98% Physical Exam Gen: well developed and well nourished appearing, appears to be in mild to moderate discomfort Head: NCAT Eyes: PERL, EOMI Nose: no epistaixis or rhinorrhea Mouth/throat: mucosa is moist and pink Neck: supple, no stridor, no jvd Lungs: CTA B, no wheezing, rhonchi or rales CV: RRR, no murmur, extremities appears well perfused.  Abd: soft, notender, nondistended Back: no ttp, no cva ttp Skin: no rashese, wnl Neuro: CN ii-xii grossly intact, no focal deficits Psyche; fl;at affect,  calm and cooperative.   ED Course  Procedures (including critical care time) Labs Review Results for orders placed during the hospital encounter of 03/12/13 (from the past 24 hour(s))  BASIC METABOLIC PANEL     Status: Abnormal   Collection Time    03/12/13  1:00 AM      Result Value Range   Sodium 137  135 - 145 mEq/L   Potassium 3.1 (*) 3.5 - 5.1 mEq/L   Chloride 99  96 - 112 mEq/L   CO2 24  19 - 32 mEq/L   Glucose, Bld 125 (*) 70 - 99 mg/dL   BUN 12  6 - 23 mg/dL   Creatinine, Ser 1.61  0.50 - 1.10 mg/dL   Calcium  09.6  8.4 - 10.5 mg/dL   GFR calc non Af Amer 70 (*) >90 mL/min   GFR calc Af Amer 82 (*) >90 mL/min  CBC WITH DIFFERENTIAL     Status: None   Collection Time    03/12/13  1:00 AM      Result Value Range   WBC 7.0  4.0 - 10.5 K/uL   RBC 4.60  3.87 - 5.11 MIL/uL   Hemoglobin 13.6  12.0 - 15.0 g/dL   HCT 04.5  40.9 - 81.1 %   MCV 85.4  78.0 - 100.0 fL   MCH 29.6  26.0 - 34.0 pg   MCHC 34.6  30.0 - 36.0 g/dL   RDW 91.4  78.2 - 95.6 %   Platelets 374  150 - 400 K/uL   Neutrophils Relative % 57  43 - 77 %   Neutro Abs 3.9  1.7 - 7.7 K/uL   Lymphocytes Relative 31  12 - 46 %   Lymphs Abs 2.1  0.7 - 4.0 K/uL   Monocytes Relative 12  3 - 12 %  Monocytes Absolute 0.8  0.1 - 1.0 K/uL   Eosinophils Relative 1  0 - 5 %   Eosinophils Absolute 0.1  0.0 - 0.7 K/uL   Basophils Relative 1  0 - 1 %   Basophils Absolute 0.0  0.0 - 0.1 K/uL  D-DIMER, QUANTITATIVE     Status: Abnormal   Collection Time    03/12/13  1:00 AM      Result Value Range   D-Dimer, Quant 1.21 (*) 0.00 - 0.48 ug/mL-FEU  LACTIC ACID, PLASMA     Status: Abnormal   Collection Time    03/12/13  1:01 AM      Result Value Range   Lactic Acid, Venous 2.5 (*) 0.5 - 2.2 mmol/L  POCT I-STAT TROPONIN I     Status: None   Collection Time    03/12/13  2:15 AM      Result Value Range   Troponin i, poc 0.00  0.00 - 0.08 ng/mL   Comment 3           URINALYSIS, ROUTINE W REFLEX MICROSCOPIC     Status: Abnormal   Collection Time    03/12/13  2:18 AM      Result Value Range   Color, Urine YELLOW  YELLOW   APPearance CLOUDY (*) CLEAR   Specific Gravity, Urine 1.019  1.005 - 1.030   pH 6.5  5.0 - 8.0   Glucose, UA NEGATIVE  NEGATIVE mg/dL   Hgb urine dipstick NEGATIVE  NEGATIVE   Bilirubin Urine NEGATIVE  NEGATIVE   Ketones, ur 15 (*) NEGATIVE mg/dL   Protein, ur NEGATIVE  NEGATIVE mg/dL   Urobilinogen, UA 0.2  0.0 - 1.0 mg/dL   Nitrite NEGATIVE  NEGATIVE   Leukocytes, UA TRACE (*) NEGATIVE  URINE MICROSCOPIC-ADD ON      Status: Abnormal   Collection Time    03/12/13  2:18 AM      Result Value Range   Squamous Epithelial / LPF RARE  RARE   WBC, UA 0-2  <3 WBC/hpf   Bacteria, UA RARE  RARE   Crystals CA OXALATE CRYSTALS (*) NEGATIVE   CXR: normal cardiac silloute, normal appearing mediastinum, no infiltrates, no acute process identified.   EKG: sinus tach, 106 bpm, normal axis, normal qrs, normal ST T segments  MDM  DDX: ACS, pneumothorax, pneumonia, pericardial or pleural effusion, gastritis, GERD/PUD, musculoskeletal pain.   First troponin and EKG are wnl. However, sx concerning for ACS - particularly in this 77 yo woman with known multivessel disease. Patient pain free. D-dimer ordered in light of SOB and is positive. CTA chest pending. We will tx with Lovenox for ACS. Case discussed with Dr. Toniann Fail who has accepted the patient to Tele Unit. The patient is prescribed BB but, was tachycardic on arrival. We will tx with dose of iv metoprolol.    Brandt Loosen, MD 03/12/13 [id number]

## 2013-03-12 NOTE — Progress Notes (Signed)
Pt refused Spiriva and said that her MD told her not to take it. RT will follow.

## 2013-03-12 NOTE — Care Management Note (Unsigned)
    Page 1 of 1   03/12/2013     3:08:03 PM   CARE MANAGEMENT NOTE 03/12/2013  Patient:  [name] [name],[name] [name]   Account Number:  [id number]  Date Initiated:  03/12/2013  Documentation initiated by:  Danisha Brassfield  Subjective/Objective Assessment:   PT ADM ON 03/12/13 WITH CHEST PAIN, N/V.  PTA, PT INDEPENDENT, LIVES WITH SPOUSE.     Action/Plan:   WILL FOLLOW FOR DISCHARGE NEEDS AS PT PROGRESSES.   Anticipated DC Date:  03/13/2013   Anticipated DC Plan:  HOME/SELF CARE      DC Planning Services  CM consult      Choice offered to / List presented to:             Status of service:  In process, will continue to follow Medicare Important Message given?   (If response is "NO", the following Medicare IM given date fields will be blank) Date Medicare IM given:   Date Additional Medicare IM given:    Discharge Disposition:    Per UR Regulation:  Reviewed for med. necessity/level of care/duration of stay  If discussed at Long Length of Stay Meetings, dates discussed:    Comments:

## 2013-03-12 NOTE — ED Notes (Signed)
Patient transported to X-ray 

## 2013-03-12 NOTE — Progress Notes (Signed)
  Echocardiogram 2D Echocardiogram has been performed.  Arvil Chaco 03/12/2013, 4:04 PM

## 2013-03-12 NOTE — Progress Notes (Signed)
UR COMPLETED  

## 2013-03-13 DIAGNOSIS — Z9889 Other specified postprocedural states: Secondary | ICD-10-CM

## 2013-03-13 NOTE — Consult Note (Signed)
Patient seen, examined, and I agree with the above documentation, including the assessment and plan. Ongoing issues with nausea vomiting and difficulty eating. History of Laparoscopic reduction and repair of hiatal hernia, laparoscopic Nissen fundoplication in January 2013 Recent CT scan revealing large hiatal hernia raising the question that the wrap may have slipped Some of her nausea and vomiting likely related to narcotic pain medication, and possibly gastroparesis from the same Agree with upper GI series which will be performed tomorrow before making further decision about management Anti-emetics as needed

## 2013-03-13 NOTE — Consult Note (Signed)
[facility] Consult: 8:44 AM 03/13/2013  LOS: 1 day    Referring Provider: Dr York Spaniel Primary Care Physician:  [name], MD Primary Gastroenterologist:  Dr. [name]     Reason for Consultation:  Nausea, vomiting, abd pain.    HPI: [name] [name] is a 77 y.o. female.  Hx CAD with stenting 2003.  Hx aspiration PNA. Chronic back pain from spinal stenosis, HNP with lumbar surgery 06/2012. S/p multiple surgeries listed below.     Hx iron def anemia.  In November, 2010 hemoglobin was 6.3. Extensive workup including upper endoscopy, enteroscopy, colonoscopy, capsule study, arteriography, UGI series and Ct scans concluded with dx of large HH, adenomatous colon polyps but no specific cause of anemia. [name]  04/07/2011 colon/egd. Pt was c/o dysphagia.  Had 7 cm HH and stricture at GE jx was dilated. Colonoscopy revealed small polyps in descending and ascending colon.  There was ? Of intermittent torsion of the [facility] and she underwent 06/03/2011 laparoscopic Nissen/reduction of large HH.  Still takes Protonix BID.   Still has low back pain She has suffered low back, right hip fractures in past several months and Dr Otelia Sergeant is concerned for pathologic fractures and referred her to consultation with Dr Shirline Frees.  He diagnosed monoclonal Gammopathy but after testing did not see evidence of MM.   Incidences of N/V with and without diarrhea and abdominal pain on visits to ED in March, August, September, October.  The current n/v began shortly after Dr Otelia Sergeant added Hydrocondone and she uses this about 2 x daily. The nausea is nearly constant.  BID Reglan added to BID Protonix:  Not helpful.  Phenergen suppositories at home and IV Zofran inpt have helped.  Phenergen makes her drowsy.  No blood in vomit.  These sxs not like those of dysphagia she had prior to the Nissen.  Stools less frequent. Her belly feels sore from vomitting.  No blood in emesis or in stool.      Past Medical History  Diagnosis Date  . CAD (coronary artery disease)     LHC 2003 with 40% pLAD, 30% mLAD, 100% D1 (moderate vessel), 100%  D2 (small vessel), 95% mid small OM2.  Pt had PTCA to D1;  D2 and OM2 small vessels and tx medically;  Last Myoview (2012): anterior infarct seen with scar, no ischemia. EF normal. Pt managed medically  . Recurrent aspiration bronchitis/pneumonia     PFTs 12/09 FVC 97%, FEV1 101%, ratio 77%, TLC 109%. No significant obstruction or restriction.   . Hypertension   . Hyperlipidemia   . Esophageal stricture   . Diverticulosis of colon   . Gastroesophageal reflux disease with hiatal hernia   . Iron deficiency anemia   . Gastritis   . GERD (gastroesophageal reflux disease)   . Hiatal hernia   . Asthma   . Blood transfusion   . Cough   . Wheezing   . Weakness   . Trouble swallowing   . Abdominal pain   . Hiatal hernia   . Shortness of breath     Past Surgical History  Procedure Laterality Date  . Back surgery    . Cholecystectomy    . Bladder repair    . Hemorrhoid surgery    . Cholecystectomy      Patient unsure of date.  [name] Appendectomy      patient unsure of date  . Total abdominal hysterectomy  1975    partial  . Coronary angioplasty  11/16/2001  . Eye surgery  11/2000    bilateral cataracts with lens implant  . Hiatal hernia repair  06/03/2011    Procedure: LAPAROSCOPIC REPAIR OF HIATAL HERNIA;  Surgeon: Ernestene Mention, MD;  Location: [facility] ORS;  Service: General;  Laterality: N/A;  . Laparoscopic nissen fundoplication  06/03/2011    Procedure: LAPAROSCOPIC NISSEN FUNDOPLICATION;  Surgeon: Ernestene Mention, MD;  Location: [facility];  Service: General;  Laterality: N/A;  . Lumbar laminectomy/decompression microdiscectomy Right 06/26/2012    Procedure: LUMBAR LAMINECTOMY/DECOMPRESSION MICRODISCECTOMY;  Surgeon: Kerrin Champagne, MD;  Location: [facility];  Service: Orthopedics;  Laterality: Right;  RIGHT L4-5 MICRODISCECTOMY    Prior to  Admission medications   Medication Sig Start Date End Date Taking? Authorizing Provider  Aclidinium Bromide (TUDORZA PRESSAIR) 400 MCG/ACT AEPB Inhale 1 puff into the lungs 2 (two) times daily.   Yes Historical Provider, MD  ALPRAZolam Prudy Feeler) 0.5 MG tablet Take 0.5 mg by mouth 3 (three) times daily as needed for sleep or anxiety.    Yes Historical Provider, MD  amLODipine (NORVASC) 10 MG tablet Take 10 mg by mouth every morning.    Yes Historical Provider, MD  calcitonin, salmon, (MIACALCIN/FORTICAL) 200 UNIT/ACT nasal spray Place 1 spray into the nose every morning. She alternates nostrils every other day.   Yes Historical Provider, MD  Cholecalciferol (VITAMIN D3) 2000 UNITS TABS Take 2,000-4,000 Units by mouth 2 (two) times daily. She takes two tablets in the morning and one tablet at bedtime.   Yes Historical Provider, MD  Cyanocobalamin (VITAMIN B 12 PO) Take 1 tablet by mouth every morning.    Yes Historical Provider, MD  gabapentin (NEURONTIN) 300 MG capsule Take 300 mg by mouth at bedtime as needed (For pain.).    Yes Historical Provider, MD  hydrochlorothiazide 25 MG tablet Take 25 mg by mouth daily with breakfast.  09/11/10  Yes Historical Provider, MD  HYDROcodone-acetaminophen (NORCO) 7.5-325 MG per tablet Take 1 tablet by mouth every 3 (three) hours as needed for pain.   Yes Historical Provider, MD  losartan (COZAAR) 100 MG tablet Take 100 mg by mouth every morning.    Yes Historical Provider, MD  metoCLOPramide (REGLAN) 10 MG tablet Take 1 tablet (10 mg total) by mouth every 6 (six) hours as needed (Nausea). 02/11/13  Yes Fayrene Helper, PA-C  metoprolol tartrate (LOPRESSOR) 25 MG tablet Take 12.5 mg by mouth at bedtime.   Yes Historical Provider, MD  mometasone-formoterol (DULERA) 200-5 MCG/ACT AERO Inhale 2 puffs into the lungs 2 (two) times daily. 01/25/12  Yes Nyoka Cowden, MD  Multiple Vitamin (MULTIVITAMIN WITH MINERALS) TABS Take 1 tablet by mouth every morning.    Yes Historical  Provider, MD  OVER THE COUNTER MEDICATION Take 1,000 mg by mouth every other day. Coral Calcium 1000mg    Yes Historical Provider, MD  OVER THE COUNTER MEDICATION Take 400 mg by mouth 3 (three) times daily. Magnesium Carbonate 400mg    Yes Historical Provider, MD  pantoprazole (PROTONIX) 40 MG tablet Take 40 mg by mouth 2 (two) times daily.   Yes Historical Provider, MD  pravastatin (PRAVACHOL) 40 MG tablet Take 40 mg by mouth at bedtime.    Yes Historical Provider, MD  promethazine (PHENERGAN) 12.5 MG suppository Place 1 suppository (12.5 mg total) rectally every 6 (six) hours as needed for nausea. 02/12/13  Yes Tatyana A Kirichenko, PA-C  promethazine (PHENERGAN) 12.5 MG tablet Take 1 tablet (12.5 mg total) by mouth every 6 (six) hours as needed for nausea. 02/12/13  Yes Lemont Fillers  A Kirichenko, PA-C    Scheduled Meds: . amLODipine  10 mg Oral q morning - 10a  . aspirin EC  325 mg Oral Daily  . calcitonin (salmon)  1 spray Alternating Nares q morning - 10a  . cholecalciferol  4,000 Units Oral Daily   And  . cholecalciferol  2,000 Units Oral QHS  . enoxaparin (LOVENOX) injection  40 mg Subcutaneous Q24H  . feeding supplement (RESOURCE BREEZE)  1 Container Oral Q1500  . losartan  100 mg Oral q morning - 10a  . metoprolol tartrate  12.5 mg Oral QHS  . mometasone-formoterol  2 puff Inhalation BID  . multivitamin with minerals  1 tablet Oral q morning - 10a  . nitroGLYCERIN  0.4 mg Transdermal Daily  . pantoprazole  40 mg Oral BID  . simvastatin  20 mg Oral q1800  . tiotropium  18 mcg Inhalation Daily   Infusions:   PRN Meds: acetaminophen, ALPRAZolam, gabapentin, HYDROcodone-acetaminophen, metoCLOPramide, morphine injection, ondansetron (ZOFRAN) IV, promethazine, promethazine   Allergies as of 03/12/2013 - Review Complete 03/12/2013  Allergen Reaction Noted  . Aspartame Diarrhea and Nausea And Vomiting   . Daliresp [roflumilast] Nausea And Vomiting 05/23/2011    Family History  Problem  Relation Age of Onset  . Cancer Mother     colon   . Colon cancer Mother 76  . Heart disease Father 3    heart attack    History   Social History  . Marital Status: Married    Spouse Name: N/A    Number of Children: N/A  . Years of Education: N/A   Occupational History  . Retired     worked at [employer] History Main Topics  . Smoking status: Never Smoker   . Smokeless tobacco: Never Used  . Alcohol Use: No  . Drug Use: No  . Sexual Activity: Not on file   Other Topics Concern  . Not on file   Social History Narrative   Lives with husband.    REVIEW OF SYSTEMS: Constitutional:  5 # weight loss in one month ENT:  No nose bleeds Pulm:  No cough or dyspnea CV:  No chest pain, no palps, no edema GU:  Slight oliguria, no hematuria GI:  Per HPI Heme:  No recent iron supplements.    Transfusions:  Previous transfusions, none in last 12 months Neuro:  No headache, no blurry vision. No asymmetric limb weakness.  Derm:  No itching or rash Endocrine:  No hx abnormal blood sugars Immunization:  Current on flu shot Travel:  none   PHYSICAL EXAM: Vital signs in last 24 hours: Filed Vitals:   03/13/13 0409  BP: 112/52  Pulse: 78  Temp: 98.4 F (36.9 C)  Resp: 16   Wt Readings from Last 3 Encounters:  03/12/13 69.763 kg (153 lb 12.8 oz)  03/05/13 71.532 kg (157 lb 11.2 oz)  02/19/13 71.85 kg (158 lb 6.4 oz)    General: Ill looking WF, somewhat uncomfortable Head:  No asymetry or swelling  Eyes:  No icterus or pallor Ears:  Not HOH  Nose:  No discharge no congestion Mouth:  Moist, clear oral mm Neck:  No mass or tenderness Lungs:  Clear.  No cough. Heart: RRR.  No MRG Abdomen:  Soft, mild diffusely sore without rebound or guard.  No mass, nosuccusion splash.   Rectal: not done   Musc/Skeltl: no joint swelling.  + Kyphosis Extremities:  No pedal edema  Neurologic:  Pleasant, alert.  Oriented x 3.  No tremor.  No limb weakness Skin:  No  rash, no telangectasia Tattoos:  none Nodes:  No cervical adenopathy   Psych:  Pleasant, somewhat depressed.  engaged and relaxed.   Intake/Output from previous day: 11/04 0701 - 11/05 0700 In: 460 [P.O.:460] Out: 300 [Urine:300] Intake/Output this shift:    LAB RESULTS:  Recent Labs  03/12/13 0100 03/12/13 0720  WBC 7.0 6.0  HGB 13.6 13.4  HCT 39.3 39.1  PLT 374 372  MCV    85  BMET Lab Results  Component Value Date   NA 136 03/12/2013   NA 137 03/12/2013   NA 139 02/19/2013   K 3.9 03/12/2013   K 3.1* 03/12/2013   K 2.8* 02/19/2013   CL 101 03/12/2013   CL 99 03/12/2013   CL 102 02/12/2013   CO2 23 03/12/2013   CO2 24 03/12/2013   CO2 24 02/19/2013   GLUCOSE 117* 03/12/2013   GLUCOSE 125* 03/12/2013   GLUCOSE 100 02/19/2013   BUN 12 03/12/2013   BUN 12 03/12/2013   BUN 12.6 02/19/2013   CREATININE 0.80 03/12/2013   CREATININE 0.80 03/12/2013   CREATININE 1.0 02/19/2013   CALCIUM 9.6 03/12/2013   CALCIUM 10.0 03/12/2013   CALCIUM 10.0 02/19/2013   LFT  Recent Labs  03/12/13 0720  PROT 7.2  ALBUMIN 3.9  AST 17  ALT 12  ALKPHOS 68  BILITOT 0.8  BILIDIR 0.2  IBILI 0.6   PT/INR Lab Results  Component Value Date   INR 0.88 02/26/2013   INR 0.85 06/26/2012   INR 0.87 06/25/2012    RADIOLOGY STUDIES: Dg Chest 2 View 03/12/2013  FINDINGS: Lung volumes are normal. Mild eventration the right hemidiaphragm is unchanged. No consolidative airspace disease. No pleural effusions. No pneumothorax. No pulmonary nodule or mass noted. Pulmonary vasculature and the cardiomediastinal silhouette are within normal limits.  IMPRESSION: 1.  No radiographic evidence of acute cardiopulmonary disease.   Electronically Signed   By: Trudie Reed M.D.   On: 03/12/2013 02:47   Ct Angio Chest Pe W/cm &/or Wo Cm 03/12/2013  .  FINDINGS: Mediastinum: There are no filling defects within the arterial tree to suggest underlying embolism. Heart size is normal. There is no significant  pericardial fluid, thickening or pericardial calcification. There is atherosclerosis of the thoracic aorta, the great vessels of the mediastinum and the coronary arteries, including calcified atherosclerotic plaque in the left main, left anterior descending, left circumflex and right coronary arteries. Curvilinear hypoattenuation throughout the anterior apical lateral wall segments, suggestive of myocardial infarctions in the LAD and potential left circumflex coronary artery territories. No pathologically enlarged mediastinal or hilar lymph nodes. Large hiatal hernia  Lungs/Pleura: No acute consolidative airspace disease. No pleural effusions. No pneumothorax. No definite suspicious appearing pulmonary nodules or masses are identified at this.  Upper Abdomen: Unremarkable.  Musculoskeletal: There are no aggressive appearing lytic or blastic lesions noted in the visualized portions of the skeleton.  Review of the MIP images confirms the above findings.  IMPRESSION: 1. No evidence of pulmonary embolism. 2. No acute findings in the thorax to account for patient's symptoms. 3. Atherosclerosis, including left main and 3 vessel coronary artery disease. Evidence of prior myocardial infarctions, as above. 4. Large hiatal hernia.   Electronically Signed   By: Trudie Reed M.D.   On: 03/12/2013 04:31    ENDOSCOPIC STUDIES: 03/2011  EGD for dysphagia 7 cm HH.  Dilated GE jx stricture  03/2011 Colonoscopy for +  family history 2 mm ascending polyp 3 mm sessile descending polyp Pathology:  Tubular adenomas without HGD Internal rrhoids. Suggest repeat study in 2017 for strong family hx.   03/2009  Colonoscopy 2 tubular adenomas without HGD on path report  12/2000  EGD Gastric erosion and chronic gastritis on Path report.   IMPRESSION:   *  Nausea, vomiting which coincides with addition of hydrocodone to her meds.  Suspect direct s/e of the meds and/or narcotic induces gastroparesis. Given her hx of Lap  Nissen, need to make sure there is not a problem with the post surgical anatomy. The Ct of chest still shows a large HH.   *  Monoclonal Gammopathy, no evidence of MM to explain multiple recent fractures.  Dr Shirline Frees following.   *  Hx iron def anemia, resolved.     PLAN:     *  UGI series to assess anatomy, and might also give insight into gastric emptying    Jennye Moccasin  03/13/2013, 8:44 AM Pager: [phone]

## 2013-03-13 NOTE — Progress Notes (Signed)
Patient ID: [name] [name], female   DOB: 12/24/1935, 77 y.o.   MRN: [id number]    SUBJECTIVE: No further chest pain. She is still nauseated with abdominal soreness.  No appetite.  [name] amLODipine  10 mg Oral q morning - 10a  . aspirin EC  325 mg Oral Daily  . calcitonin (salmon)  1 spray Alternating Nares q morning - 10a  . cholecalciferol  4,000 Units Oral Daily   And  . cholecalciferol  2,000 Units Oral QHS  . enoxaparin (LOVENOX) injection  40 mg Subcutaneous Q24H  . feeding supplement (RESOURCE BREEZE)  1 Container Oral Q1500  . losartan  100 mg Oral q morning - 10a  . metoprolol tartrate  12.5 mg Oral QHS  . mometasone-formoterol  2 puff Inhalation BID  . multivitamin with minerals  1 tablet Oral q morning - 10a  . nitroGLYCERIN  0.4 mg Transdermal Daily  . pantoprazole  40 mg Oral BID  . simvastatin  20 mg Oral q1800  . tiotropium  18 mcg Inhalation Daily      Filed Vitals:   03/12/13 1345 03/12/13 2049 03/12/13 2355 03/13/13 0409  BP: 137/71 144/72 143/70 112/52  Pulse: 79 62 82 78  Temp: 97.6 F (36.4 C) 98.7 F (37.1 C) 98.8 F (37.1 C) 98.4 F (36.9 C)  TempSrc: Oral Oral Oral Oral  Resp: 18 18 18 16   Height:      Weight:      SpO2: 95% 97% 95% 97%    Intake/Output Summary (Last 24 hours) at 03/13/13 0734 Last data filed at 03/12/13 2050  Gross per 24 hour  Intake    460 ml  Output    300 ml  Net    160 ml    LABS: Basic Metabolic Panel:  Recent Labs  47/82/95 0100 03/12/13 0720  NA 137 136  K 3.1* 3.9  CL 99 101  CO2 24 23  GLUCOSE 125* 117*  BUN 12 12  CREATININE 0.80 0.80  CALCIUM 10.0 9.6   Liver Function Tests:  Recent Labs  03/12/13 0720  AST 17  ALT 12  ALKPHOS 68  BILITOT 0.8  PROT 7.2  ALBUMIN 3.9    Recent Labs  03/12/13 0720  LIPASE 12   CBC:  Recent Labs  03/12/13 0100 03/12/13 0720  WBC 7.0 6.0  NEUTROABS 3.9 2.9  HGB 13.6 13.4  HCT 39.3 39.1  MCV 85.4 85.2  PLT 374 372   Cardiac Enzymes:  Recent  Labs  03/12/13 0720 03/12/13 1057 03/12/13 1730  TROPONINI <0.30 <0.30 <0.30   BNP: No components found with this basename: POCBNP,  D-Dimer:  Recent Labs  03/12/13 0100  DDIMER 1.21*   Hemoglobin A1C: No results found for this basename: HGBA1C,  in the last 72 hours Fasting Lipid Panel: No results found for this basename: CHOL, HDL, LDLCALC, TRIG, CHOLHDL, LDLDIRECT,  in the last 72 hours Thyroid Function Tests: No results found for this basename: TSH, T4TOTAL, FREET3, T3FREE, THYROIDAB,  in the last 72 hours Anemia Panel: No results found for this basename: VITAMINB12, FOLATE, FERRITIN, TIBC, IRON, RETICCTPCT,  in the last 72 hours  RADIOLOGY: Dg Chest 2 View  03/12/2013   CLINICAL DATA:  Chest tightness  EXAM: CHEST  2 VIEW  COMPARISON:  Chest x-ray 06/25/2012.  FINDINGS: Lung volumes are normal. Mild eventration the right hemidiaphragm is unchanged. No consolidative airspace disease. No pleural effusions. No pneumothorax. No pulmonary nodule or mass noted. Pulmonary vasculature and the cardiomediastinal  silhouette are within normal limits.  IMPRESSION: 1.  No radiographic evidence of acute cardiopulmonary disease.   Electronically Signed   By: Trudie Reed M.D.   On: 03/12/2013 02:47   Ct Angio Chest Pe W/cm &/or Wo Cm  03/12/2013   CLINICAL DATA:  Chest pain.  EXAM: CT ANGIOGRAPHY CHEST WITH CONTRAST  TECHNIQUE: Multidetector CT imaging of the chest was performed using the standard protocol during bolus administration of intravenous contrast. Multiplanar CT image reconstructions including MIPs were obtained to evaluate the vascular anatomy.  CONTRAST:  OMNIPAQUE IOHEXOL 350 MG/ML SOLN  COMPARISON:  Chest CT 03/01/2011.  FINDINGS: Mediastinum: There are no filling defects within the arterial tree to suggest underlying embolism. Heart size is normal. There is no significant pericardial fluid, thickening or pericardial calcification. There is atherosclerosis of the thoracic  aorta, the great vessels of the mediastinum and the coronary arteries, including calcified atherosclerotic plaque in the left main, left anterior descending, left circumflex and right coronary arteries. Curvilinear hypoattenuation throughout the anterior apical lateral wall segments, suggestive of myocardial infarctions in the LAD and potential left circumflex coronary artery territories. No pathologically enlarged mediastinal or hilar lymph nodes. Large hiatal hernia  Lungs/Pleura: No acute consolidative airspace disease. No pleural effusions. No pneumothorax. No definite suspicious appearing pulmonary nodules or masses are identified at this.  Upper Abdomen: Unremarkable.  Musculoskeletal: There are no aggressive appearing lytic or blastic lesions noted in the visualized portions of the skeleton.  Review of the MIP images confirms the above findings.  IMPRESSION: 1. No evidence of pulmonary embolism. 2. No acute findings in the thorax to account for patient's symptoms. 3. Atherosclerosis, including left main and 3 vessel coronary artery disease. Evidence of prior myocardial infarctions, as above. 4. Large hiatal hernia.   Electronically Signed   By: Trudie Reed M.D.   On: 03/12/2013 04:31   Ct Abdomen Pelvis W Contrast  02/11/2013   CLINICAL DATA:  Nausea and vomiting. Abdominal pain and soreness. Chills. Right hip pain.  EXAM: CT ABDOMEN AND PELVIS WITH CONTRAST  TECHNIQUE: Multidetector CT imaging of the abdomen and pelvis was performed using the standard protocol following bolus administration of intravenous contrast.  CONTRAST:  OMNIPAQUE IOHEXOL 300 MG/ML  SOLN  COMPARISON:  CT abdomen and pelvis 06/20/2012. MRI lumbar spine 12/11/2012. MRI right hip 0 8/0 30/2014.  FINDINGS: There is some dependent atelectasis in lung bases. The patient is status post Nissen fundoplication with a small hiatal hernia unchanged. No pleural or pericardial effusion.  The patient is status post cholecystectomy. The  liver, biliary tree, adrenal glands, spleen, pancreas and right kidney all appear normal. A 0.6 cm low attenuating lesion in the lower pole left kidney is compatible with a cyst.  The patient is status post hysterectomy. Urinary bladder is unremarkable. The stomach and small and large bowel appear normal. There is no lymphadenopathy or fluid.  Bones demonstrate compression fractures of L1, L2 and L3 as seen on the comparison MRI. Bilateral sacral a ala fractures are also identified as seen on MRI. Avascular necrosis right femoral head and right parasymphyseal pubic bone fracture as seen on prior MRI.  IMPRESSION: No acute finding.  No change in a hiatal hernia with the patient status post fundoplication.  L1, L2 and L3 compression fractures, bilateral sacral ala fractures and right parasymphyseal pubic bone fracture as seen on recent MRI scans.   Electronically Signed   By: Drusilla Kanner M.D.   On: 02/11/2013 11:51   Ct  Biopsy  02/26/2013   *RADIOLOGY REPORT*  Indication: Multiple myeloma  CT GUIDED LEFT ILIAC BONE MARROW ASPIRATION AND BONE MARROW CORE BIOPSIES  Intravenous medications: Fentanyl 150 mcg IV; Versed 3 mg IV  Sedation time: 10 minutes  Contrast volume: None  Complications: None immediate  PROCEDURE/FINDINGS:  Informed consent was obtained from the patient following an explanation of the procedure, risks, benefits and alternatives. The patient understands, agrees and consents for the procedure. All questions were addressed.  A time out was performed prior to the initiation of the procedure.  The patient was positioned prone and noncontrast localization CT was performed of the pelvis to demonstrate the iliac marrow spaces.  The operative site was prepped and draped in the usual sterile fashion.  Under sterile conditions and local anesthesia, an 11 gauge coaxial bone biopsy needle was advanced into the left iliac marrow space. Needle position was confirmed with CT imaging.  Initially, bone marrow  aspiration was performed. Next, a bone marrow biopsy was obtained with the 11 gauge outer bone marrow device.  Samples were prepared with the cytotechnologist and deemed adequate.  The needle was removed intact.  Hemostasis was obtained with compression and a dressing was placed. The patient tolerated the procedure well without immediate post procedural complication.  IMPRESSION:  Successful CT guided left iliac bone marrow aspiration and core biopsies.   Original Report Authenticated By: Tacey Ruiz, MD   Dg Abd Acute W/chest  02/12/2013   CLINICAL DATA:  Upper abdominal pain with nausea and vomiting.  EXAM: ACUTE ABDOMEN SERIES (ABDOMEN 2 VIEW & CHEST 1 VIEW)  COMPARISON:  CT scan dated 02/11/2013 and chest x-ray dated 06/25/2012  FINDINGS: There is no evidence of dilated bowel loops or free intraperitoneal air. No radiopaque calculi or other significant radiographic abnormality is seen. Oral contrast is seen in the right side of the colon from the recent abdominal CT scan. Heart size and mediastinal contours are within normal limits. Both lungs are clear.  Again noted are multiple spine fractures, appearing unchanged.  IMPRESSION: No acute abnormality in the abdomen. No acute cardiopulmonary disease.   Electronically Signed   By: Geanie Cooley M.D.   On: 02/12/2013 13:27   Dg Bone Survey Met  02/26/2013   CLINICAL DATA:  Multiple myeloma.  EXAM: METASTATIC BONE SURVEY  COMPARISON:  Multiple exams, including 11/19/2008  FINDINGS: Calvarial thickening noted, but with the without well-defined focal myeloma lesions in the calvarium.  Known compression fractures at L1 and L3. L4 and L5 laminectomies. Anterolisthesis at L4-5.  Suspected hiatal hernia.  IMPRESSION: 1. No focal myelomatous lesions are identified. 2. Known compression fractures at L1 and L3. 3. Hiatal hernia.   Electronically Signed   By: Herbie Baltimore M.D.   On: 02/26/2013 09:15   Ct Cta Abd/pel W/cm &/or W/o Cm  02/12/2013   *RADIOLOGY  REPORT*  Clinical Data:  Evaluate for mesenteric ischemia, abdominal pain, emesis  CT ANGIOGRAPHY ABDOMEN AND PELVIS  Technique:  Multidetector CT imaging of the abdomen and pelvis was performed using the standard protocol during bolus administration of intravenous contrast.  Multiplanar reconstructed images including MIPs were obtained and reviewed to evaluate the vascular anatomy.  Contrast: OMNIPAQUE IOHEXOL 350 MG/ML SOLN  Comparison:  CT abdomen pelvis - 02/11/2013; 06/30/2012  Vascular Findings:  Abdominal aorta:  Scattered mixed calcified and noncalcified atherosclerotic plaque within a mildly tortuous but normal caliber abdominal aorta.  No abdominal aortic dissection or periaortic stranding.  Celiac artery:  There is  a minimal amount of calcified atherosclerotic plaque involving the origin of the celiac artery, not resulting in hemodynamically significant stenosis.  SMA:  Widely patent.  The right and left hepatic arteries are incidentally noted to arise from the proximal SMA.  Right renal artery:  Duplicated; the codominant right-sided renal arteries are widely patent without hemodynamically significant narrowing.  Left renal artery:  Solitary; there is calcified plaque involving the cranial aspect of the origin and proximal left renal artery, not resulting in hemodynamically significant stenosis.  IAM:  Widely patent.  Pelvic vasculature:  There is scattered minimal atherosclerotic plaque within the bilateral common and internal iliac arteries, not resulting in hemodynamically significant stenosis.  The bilateral external iliac arteries are widely patent without hemodynamically significant narrowing.  Review of the MIP images confirms the above findings.  ----------------------------------------------------  Nonvascular findings:  Normal hepatic contour. Punctate (<5 mm) hypoattenuating lesion within the anterior segment right lobe of the liver (image 19, series five) is too small to accurately  characterize a favored to represent a hepatic cyst.  Post cholecystectomy.  There is mild dilatation of the common bile duct measuring approximately 1.1 cm in greatest oblique coronal dimension as well as very mild centralized intrahepatic biliary duct dilatation, presumably as well post cholecystectomy state.  No ascites.  There is symmetric enhancement and excretion of the bilateral kidneys.  No definite renal stones on the post contrast examination.  Punctate sub centimeter hypoattenuating lesion within the left kidney is too small to fully characterize but favored to represent a hepatic cyst.  No discrete right-sided renal lesions. No definite renal stones as post contrast examination.  No obstruction.  There is a very minimal amount of grossly symmetric likely.  Perinephric stranding.  Normal appearance of the bilateral adrenal glands, pancreas and spleen.  Ingested enteric contrast extends to the level of the hepatic flexure of the colon.  The descending and sigmoid colon are underdistended.  No evidence of enteric obstruction.  No discrete abnormal areas of bowel wall enhancement.  The appendix is not visualized, however there is no inflammatory change within the right lower abdominal quadrant.  No pneumoperitoneum, pneumatosis or portal venous gas.  No retroperitoneal, mesenteric, pelvic or inguinal lymphadenopathy.  Post hysterectomy.  No discrete adnexal lesion.  No free fluid in the pelvis.  No focal airspace opacities.  There is minimal subsegmental atelectasis with the caudal segment lingula.  No pleural effusion.  Borderline cardiomegaly.  No pericardial effusion.  No acute or aggressive osseous abnormalities.  Redemonstrated severe compression deformities of the L1 and L3 vertebral bodies. Grossly unchanged mild (approximately 3 mm) of anterolisthesis of L4 upon L5. Moderate to severe multilevel lumbar spine DDD.  IMPRESSION:  1.  No explanation for patient's abdominal pain and emesis. Specifically,  no evidence of mesenteric ischemia. 2.  Scattered minimal atherosclerotic plaque within a tortuous but normal caliber abdominal aorta.  2.  Grossly unchanged as severe (> 75%) compression deformities of the L1 and L3 vertebral bodies.   Original Report Authenticated By: Tacey Ruiz, MD    PHYSICAL EXAM General: NAD Neck: No JVD, no thyromegaly or thyroid nodule.  Lungs: Clear to auscultation bilaterally with normal respiratory effort. CV: Nondisplaced PMI.  Heart regular S1/S2, no S3/S4, no murmur.  No peripheral edema.  No carotid bruit.  Normal pedal pulses.  Abdomen: Soft, mild diffuse tenderness, no hepatosplenomegaly, no distention.  Neurologic: Alert and oriented x 3.  Psych: Normal affect. Extremities: No clubbing or cyanosis.   TELEMETRY: Reviewed telemetry  pt in NS[facility]  ASSESSMENT AND PLAN: 77 yo with history of CAD s/p prior PCI presented with nausea, vomiting, abdominal soreness, and chest pain.  1. CAD: Per patient, chest pain began after nausea and vomiting.  She is pain-free now.  Cardiac enzymes were negative despite prolonged pain.  Echo showed no wall motion abnormalities.  I think that the chest pain is noncardiac, potentially related to nausea/vomiting.  When she is discharged, we will arrange stress test.  Continue prior to admission cardiac meds.  2. Nausea/abdominal discomfort: Apparently, she has had nausea frequently since a back surgery. She could also have gastroenteritis.  Appetite is still poor.  This issue will be per internal medicine.   Please call cardiology with further questions.  Let us know when she goes home so stress test and followup can be arranged.   Marca Ancona 03/13/2013 7:38 AM

## 2013-03-13 NOTE — Progress Notes (Signed)
[facility] PROGRESS NOTE  [name] [name] WUJ:[id number] DOB: 02-18-1936 DOA: 03/12/2013 PCP: [name], MD  Assessment/Plan: 77 y.o. female with known history of CAD status post stenting, hypertension, hyperlipidemia, GERD admitted with chest pain   1. Chest pain atypical likely GI related; has risk factors, h/o CAD PTCA; CTA: no PE -echo: LVEF 60%, no wal motion abnormalities; ECG, initial trop unremarkable; cont NTG, ASA, monitor on tele; c/s cardiology: may need stress test outpatient; appreciate cardiology eval;   2. CAD s/p PTCA [facility] 2003 with 40% pLAD, 30% mLAD, 100% D1 (moderate vessel), 100% D2 (small vessel), 95% mid small OM2. Pt had PTCA to D1; D2 and OM2 small vessels and tx medically; Last Myoview (5/09): EF 66%, anteroseptal/apical infarct with minimal peri-infarct ischemia. No significant change from 11/06) -cont  ASA, BB, statin, NTG  3. HTN cont home meds;   4. H/o GE junction stricture, s/p dilatation in 2012; hiatal hernia; GERD -cont PPI, antiemetics; IVF; c/s GI may need EGD dilatation again, esophagogram    Code Status: full Family Communication: d/w husband at the bedside (indicate person spoken with, relationship, and if by phone, the number) Disposition Plan: home when ready    Consultants:  Cardiology   Procedures:  CTA  Antibiotics:  None  (indicate start date, and stop date if known)  HPI/Subjective: Alert  Objective: Filed Vitals:   03/13/13 0409  BP: 112/52  Pulse: 78  Temp: 98.4 F (36.9 C)  Resp: 16    Intake/Output Summary (Last 24 hours) at 03/13/13 0818 Last data filed at 03/12/13 2050  Gross per 24 hour  Intake    340 ml  Output    300 ml  Net     40 ml   Filed Weights   03/12/13 0345 03/12/13 0524  Weight: 69.4 kg (153 lb) 69.763 kg (153 lb 12.8 oz)    Exam:   General:  alert  Cardiovascular: s1,s2 rrr  Respiratory: CTA BL   Abdomen: soft, nt, nd   Musculoskeletal: no le edema    Data  Reviewed: Basic Metabolic Panel:  Recent Labs Lab 03/12/13 0100 03/12/13 0720  NA 137 136  K 3.1* 3.9  CL 99 101  CO2 24 23  GLUCOSE 125* 117*  BUN 12 12  CREATININE 0.80 0.80  CALCIUM 10.0 9.6   Liver Function Tests:  Recent Labs Lab 03/12/13 0720  AST 17  ALT 12  ALKPHOS 68  BILITOT 0.8  PROT 7.2  ALBUMIN 3.9    Recent Labs Lab 03/12/13 0720  LIPASE 12   No results found for this basename: AMMONIA,  in the last 168 hours CBC:  Recent Labs Lab 03/12/13 0100 03/12/13 0720  WBC 7.0 6.0  NEUTROABS 3.9 2.9  HGB 13.6 13.4  HCT 39.3 39.1  MCV 85.4 85.2  PLT 374 372   Cardiac Enzymes:  Recent Labs Lab 03/12/13 0720 03/12/13 1057 03/12/13 1730  TROPONINI <0.30 <0.30 <0.30   BNP (last 3 results) No results found for this basename: PROBNP,  in the last 8760 hours CBG: No results found for this basename: GLUCAP,  in the last 168 hours  Recent Results (from the past 240 hour(s))  CULTURE, BLOOD (ROUTINE X 2)     Status: None   Collection Time    03/12/13  1:45 AM      Result Value Range Status   Specimen Description BLOOD LEFT FOREARM   Final   Special Requests BOTTLES DRAWN AEROBIC ONLY 8CC   Final  Culture  Setup Time     Final   Value: 03/12/2013 03:58     Performed at [facility]   Culture     Final   Value:        BLOOD CULTURE RECEIVED NO GROWTH TO DATE CULTURE WILL BE HELD FOR 5 DAYS BEFORE ISSUING A FINAL NEGATIVE REPORT     Performed at [facility]   Report Status PENDING   Incomplete  CULTURE, BLOOD (ROUTINE X 2)     Status: None   Collection Time    03/12/13  2:00 AM      Result Value Range Status   Specimen Description BLOOD RIGHT HAND   Final   Special Requests BOTTLES DRAWN AEROBIC ONLY 3CC   Final   Culture  Setup Time     Final   Value: 03/12/2013 08:07     Performed at [facility]   Culture     Final   Value:        BLOOD CULTURE RECEIVED NO GROWTH TO DATE CULTURE WILL BE HELD FOR 5 DAYS BEFORE  ISSUING A FINAL NEGATIVE REPORT     Performed at [facility]   Report Status PENDING   Incomplete     Studies: Dg Chest 2 View  03/12/2013   CLINICAL DATA:  Chest tightness  EXAM: CHEST  2 VIEW  COMPARISON:  Chest x-ray 06/25/2012.  FINDINGS: Lung volumes are normal. Mild eventration the right hemidiaphragm is unchanged. No consolidative airspace disease. No pleural effusions. No pneumothorax. No pulmonary nodule or mass noted. Pulmonary vasculature and the cardiomediastinal silhouette are within normal limits.  IMPRESSION: 1.  No radiographic evidence of acute cardiopulmonary disease.   Electronically Signed   By: Trudie Reed M.D.   On: 03/12/2013 02:47   Ct Angio Chest Pe W/cm &/or Wo Cm  03/12/2013   CLINICAL DATA:  Chest pain.  EXAM: CT ANGIOGRAPHY CHEST WITH CONTRAST  TECHNIQUE: Multidetector CT imaging of the chest was performed using the standard protocol during bolus administration of intravenous contrast. Multiplanar CT image reconstructions including MIPs were obtained to evaluate the vascular anatomy.  CONTRAST:  OMNIPAQUE IOHEXOL 350 MG/ML SOLN  COMPARISON:  Chest CT 03/01/2011.  FINDINGS: Mediastinum: There are no filling defects within the arterial tree to suggest underlying embolism. Heart size is normal. There is no significant pericardial fluid, thickening or pericardial calcification. There is atherosclerosis of the thoracic aorta, the great vessels of the mediastinum and the coronary arteries, including calcified atherosclerotic plaque in the left main, left anterior descending, left circumflex and right coronary arteries. Curvilinear hypoattenuation throughout the anterior apical lateral wall segments, suggestive of myocardial infarctions in the LAD and potential left circumflex coronary artery territories. No pathologically enlarged mediastinal or hilar lymph nodes. Large hiatal hernia  Lungs/Pleura: No acute consolidative airspace disease. No pleural effusions. No  pneumothorax. No definite suspicious appearing pulmonary nodules or masses are identified at this.  Upper Abdomen: Unremarkable.  Musculoskeletal: There are no aggressive appearing lytic or blastic lesions noted in the visualized portions of the skeleton.  Review of the MIP images confirms the above findings.  IMPRESSION: 1. No evidence of pulmonary embolism. 2. No acute findings in the thorax to account for patient's symptoms. 3. Atherosclerosis, including left main and 3 vessel coronary artery disease. Evidence of prior myocardial infarctions, as above. 4. Large hiatal hernia.   Electronically Signed   By: Trudie Reed M.D.   On: 03/12/2013 04:31    Scheduled  Meds: . amLODipine  10 mg Oral q morning - 10a  . aspirin EC  325 mg Oral Daily  . calcitonin (salmon)  1 spray Alternating Nares q morning - 10a  . cholecalciferol  4,000 Units Oral Daily   And  . cholecalciferol  2,000 Units Oral QHS  . enoxaparin (LOVENOX) injection  40 mg Subcutaneous Q24H  . feeding supplement (RESOURCE BREEZE)  1 Container Oral Q1500  . losartan  100 mg Oral q morning - 10a  . metoprolol tartrate  12.5 mg Oral QHS  . mometasone-formoterol  2 puff Inhalation BID  . multivitamin with minerals  1 tablet Oral q morning - 10a  . nitroGLYCERIN  0.4 mg Transdermal Daily  . pantoprazole  40 mg Oral BID  . simvastatin  20 mg Oral q1800  . tiotropium  18 mcg Inhalation Daily   Continuous Infusions:    Principal Problem:   Chest pain Active Problems:   HYPERLIPIDEMIA   HYPERTENSION   CAD, UNSPECIFIED SITE    Time spent: >35 minutes     [name] Sheets  [facility] Pager [phone]. If 7PM-7AM, please contact night-coverage at [email or web address], password [facility] 03/13/2013, 8:18 AM  LOS: 1 day

## 2013-03-14 ENCOUNTER — Observation Stay (HOSPITAL_COMMUNITY): Payer: Medicare Other

## 2013-03-14 DIAGNOSIS — R112 Nausea with vomiting, unspecified: Secondary | ICD-10-CM

## 2013-03-14 MED ORDER — MORPHINE SULFATE 2 MG/ML IJ SOLN
2.0000 mg | INTRAMUSCULAR | Status: DC | PRN
  Administered 2013-03-14 (×2): 2 mg via INTRAVENOUS
  Filled 2013-03-14 (×2): qty 1

## 2013-03-14 MED ORDER — POLYETHYLENE GLYCOL 3350 17 G PO PACK
17.0000 g | PACK | Freq: Once | ORAL | Status: AC
  Administered 2013-03-14: 17 g via ORAL
  Filled 2013-03-14: qty 1

## 2013-03-14 MED ORDER — KCL IN DEXTROSE-NACL 20-5-0.9 MEQ/L-%-% IV SOLN
INTRAVENOUS | Status: DC
  Administered 2013-03-14 – 2013-03-15 (×3): via INTRAVENOUS
  Filled 2013-03-14 (×8): qty 1000

## 2013-03-14 MED ORDER — ONDANSETRON HCL 4 MG/2ML IJ SOLN
4.0000 mg | Freq: Four times a day (QID) | INTRAMUSCULAR | Status: DC
  Administered 2013-03-14 – 2013-03-16 (×6): 4 mg via INTRAVENOUS
  Filled 2013-03-14 (×7): qty 2

## 2013-03-14 NOTE — Progress Notes (Signed)
Patient ID: [name] [name], female   DOB: 11/12/1935, 77 y.o.   MRN: [id number]  This is a patient of Dr. Jacinto Halim.  He is off this afternoon.  I will notify him of the patient's admission.

## 2013-03-14 NOTE — Progress Notes (Signed)
Patient ID: [name] [name], female   DOB: October 23, 1935, 77 y.o.   MRN: [id number]   SUBJECTIVE: No further chest pain. She is still nauseated with abdominal soreness.  No appetite.  [name] amLODipine  10 mg Oral q morning - 10a  . aspirin EC  325 mg Oral Daily  . calcitonin (salmon)  1 spray Alternating Nares q morning - 10a  . cholecalciferol  4,000 Units Oral Daily   And  . cholecalciferol  2,000 Units Oral QHS  . enoxaparin (LOVENOX) injection  40 mg Subcutaneous Q24H  . feeding supplement (RESOURCE BREEZE)  1 Container Oral Q1500  . losartan  100 mg Oral q morning - 10a  . metoprolol tartrate  12.5 mg Oral QHS  . mometasone-formoterol  2 puff Inhalation BID  . multivitamin with minerals  1 tablet Oral q morning - 10a  . nitroGLYCERIN  0.4 mg Transdermal Daily  . pantoprazole  40 mg Oral BID  . simvastatin  20 mg Oral q1800  . tiotropium  18 mcg Inhalation Daily    Filed Vitals:   03/13/13 0840 03/13/13 1445 03/13/13 2040 03/14/13 0440  BP:  124/67 114/60 98/54  Pulse:  80 74 75  Temp:  98.5 F (36.9 C) 98.2 F (36.8 C) 98.1 F (36.7 C)  TempSrc:  Oral Oral Oral  Resp:  18 18 18   Height:      Weight:      SpO2: 96% 97% 97% 98%    Intake/Output Summary (Last 24 hours) at 03/14/13 0830 Last data filed at 03/14/13 0505  Gross per 24 hour  Intake    240 ml  Output   2000 ml  Net  -1760 ml    LABS: Basic Metabolic Panel:  Recent Labs  40/98/11 0100 03/12/13 0720  NA 137 136  K 3.1* 3.9  CL 99 101  CO2 24 23  GLUCOSE 125* 117*  BUN 12 12  CREATININE 0.80 0.80  CALCIUM 10.0 9.6   Liver Function Tests:  Recent Labs  03/12/13 0720  AST 17  ALT 12  ALKPHOS 68  BILITOT 0.8  PROT 7.2  ALBUMIN 3.9    Recent Labs  03/12/13 0720  LIPASE 12   CBC:  Recent Labs  03/12/13 0100 03/12/13 0720  WBC 7.0 6.0  NEUTROABS 3.9 2.9  HGB 13.6 13.4  HCT 39.3 39.1  MCV 85.4 85.2  PLT 374 372   Cardiac Enzymes:  Recent Labs  03/12/13 0720 03/12/13 1057  03/12/13 1730  TROPONINI <0.30 <0.30 <0.30   BNP: No components found with this basename: POCBNP,  D-Dimer:  Recent Labs  03/12/13 0100  DDIMER 1.21*   PHYSICAL EXAM General: NAD Neck: No JVD, no thyromegaly or thyroid nodule.  Lungs: Clear to auscultation bilaterally with normal respiratory effort. CV: Nondisplaced PMI.  Heart regular S1/S2, no S3/S4, no murmur.  No peripheral edema.  No carotid bruit.  Normal pedal pulses.  Abdomen: Soft, mild diffuse tenderness, no hepatosplenomegaly, no distention.  Neurologic: Alert and oriented x 3.  Psych: Normal affect. Extremities: No clubbing or cyanosis.   TELEMETRY: Reviewed telemetry pt in NSR, 1 episode of nsVT - total 6 beats at 00:30 am the last night   ASSESSMENT AND PLAN:  77 yo with history of CAD s/p prior PCI presented with nausea, vomiting, abdominal soreness, and chest pain.   1. CAD: Per patient, chest pain began after nausea and vomiting.  She is pain-free now.  Cardiac enzymes were negative despite prolonged pain.  Echo  showed no wall motion abnormalities.  I think that the chest pain is noncardiac, potentially related to nausea/vomiting.  When she is discharged, we will arrange stress test.  Continue prior to admission cardiac meds.   2. nsVT on telemetry the last night, please uptitrate Metoprolol as tolerated by BP.  3. Nausea/abdominal discomfort: EGD today   Please call cardiology with further questions.  Let us know when she goes home so stress test and followup can be arranged.   Tobias Alexander, H 03/14/2013 8:30 AM

## 2013-03-14 NOTE — Progress Notes (Signed)
Patient seen, examined, and I agree with the above documentation, including the assessment and plan. Recurrence of large HH after previous Nissen fundoplication. Some of her nausea and vomiting are felt secondary to narcotic pain medications, however her hernia was previously symptomatic.  Symptoms resolved after surgery, but have returned, so it is likely the recurrent hernia is contributing to symptoms Dr. Derrell Lolling will see patient to this regard. Call with questions

## 2013-03-14 NOTE — Progress Notes (Signed)
[facility] PROGRESS NOTE  Lauralynn Loeb HYQ:[id number] DOB: [birth year] DOA: 03/12/2013 PCP: [name], MD  Assessment/Plan: 77 y.o. female with known history of CAD status post stenting, hypertension, hyperlipidemia, GERD admitted with chest pain   1. Chest pain atypical likely GI related h/o reduction and repair of hiatal hernia, laparoscopic Nissen fundoplication in January 2013 -s/p dilatation in 2012; hiatal hernia; GERD -Pend upper GI series, cont pain control; PPI, IVF; appreciate GI input   2. CAD s/p PTCA [facility] 2003 with 40% pLAD, 30% mLAD, 100% D1 (moderate vessel), 100% D2 (small vessel), 95% mid small OM2. Pt had PTCA to D1; D2 and OM2 small vessels and tx medically; Last Myoview (5/09): EF 66%, anteroseptal/apical infarct with minimal peri-infarct ischemia. No significant change from 11/06); has risk factors, h/o CAD PTCA; CTA: no PE -echo: LVEF 60%, no wal motion abnormalities; ECG, initial trop unremarkable; cont NTG, statin, ASA, monitor on tele;  -c/s cardiology: may need stress test outpatient; appreciate cardiology eval;   3. HTN cont home meds;    Code Status: full Family Communication: d/w husband at the bedside (indicate person spoken with, relationship, and if by phone, the number) Disposition Plan: home when ready    Consultants:  Cardiology   Procedures:  CTA  Antibiotics:  None  (indicate start date, and stop date if known)  HPI/Subjective: Alert  Objective: Filed Vitals:   03/14/13 0440  BP: 98/54  Pulse: 75  Temp: 98.1 F (36.7 C)  Resp: 18    Intake/Output Summary (Last 24 hours) at 03/14/13 0800 Last data filed at 03/14/13 0505  Gross per 24 hour  Intake    240 ml  Output   2000 ml  Net  -1760 ml   Filed Weights   03/12/13 0345 03/12/13 0524  Weight: 69.4 kg (153 lb) 69.763 kg (153 lb 12.8 oz)    Exam:   General:  alert  Cardiovascular: s1,s2 rrr  Respiratory: CTA BL   Abdomen: soft, nt, nd    Musculoskeletal: no le edema    Data Reviewed: Basic Metabolic Panel:  Recent Labs Lab 03/12/13 0100 03/12/13 0720  NA 137 136  K 3.1* 3.9  CL 99 101  CO2 24 23  GLUCOSE 125* 117*  BUN 12 12  CREATININE 0.80 0.80  CALCIUM 10.0 9.6   Liver Function Tests:  Recent Labs Lab 03/12/13 0720  AST 17  ALT 12  ALKPHOS 68  BILITOT 0.8  PROT 7.2  ALBUMIN 3.9    Recent Labs Lab 03/12/13 0720  LIPASE 12   No results found for this basename: AMMONIA,  in the last 168 hours CBC:  Recent Labs Lab 03/12/13 0100 03/12/13 0720  WBC 7.0 6.0  NEUTROABS 3.9 2.9  HGB 13.6 13.4  HCT 39.3 39.1  MCV 85.4 85.2  PLT 374 372   Cardiac Enzymes:  Recent Labs Lab 03/12/13 0720 03/12/13 1057 03/12/13 1730  TROPONINI <0.30 <0.30 <0.30   BNP (last 3 results) No results found for this basename: PROBNP,  in the last 8760 hours CBG: No results found for this basename: GLUCAP,  in the last 168 hours  Recent Results (from the past 240 hour(s))  CULTURE, BLOOD (ROUTINE X 2)     Status: None   Collection Time    03/12/13  1:45 AM      Result Value Range Status   Specimen Description BLOOD LEFT FOREARM   Final   Special Requests BOTTLES DRAWN AEROBIC ONLY 8CC   Final   Culture  Setup Time     Final   Value: 03/12/2013 03:58     Performed at [facility]   Culture     Final   Value:        BLOOD CULTURE RECEIVED NO GROWTH TO DATE CULTURE WILL BE HELD FOR 5 DAYS BEFORE ISSUING A FINAL NEGATIVE REPORT     Performed at [facility]   Report Status PENDING   Incomplete  CULTURE, BLOOD (ROUTINE X 2)     Status: None   Collection Time    03/12/13  2:00 AM      Result Value Range Status   Specimen Description BLOOD RIGHT HAND   Final   Special Requests BOTTLES DRAWN AEROBIC ONLY 3CC   Final   Culture  Setup Time     Final   Value: 03/12/2013 08:07     Performed at [facility]   Culture     Final   Value:        BLOOD CULTURE RECEIVED NO GROWTH TO DATE  CULTURE WILL BE HELD FOR 5 DAYS BEFORE ISSUING A FINAL NEGATIVE REPORT     Performed at [facility]   Report Status PENDING   Incomplete     Studies: No results found.  Scheduled Meds: . amLODipine  10 mg Oral q morning - 10a  . aspirin EC  325 mg Oral Daily  . calcitonin (salmon)  1 spray Alternating Nares q morning - 10a  . cholecalciferol  4,000 Units Oral Daily   And  . cholecalciferol  2,000 Units Oral QHS  . enoxaparin (LOVENOX) injection  40 mg Subcutaneous Q24H  . feeding supplement (RESOURCE BREEZE)  1 Container Oral Q1500  . losartan  100 mg Oral q morning - 10a  . metoprolol tartrate  12.5 mg Oral QHS  . mometasone-formoterol  2 puff Inhalation BID  . multivitamin with minerals  1 tablet Oral q morning - 10a  . nitroGLYCERIN  0.4 mg Transdermal Daily  . pantoprazole  40 mg Oral BID  . simvastatin  20 mg Oral q1800  . tiotropium  18 mcg Inhalation Daily   Continuous Infusions:    Principal Problem:   Chest pain Active Problems:   HYPERLIPIDEMIA   HYPERTENSION   CAD, UNSPECIFIED SITE    Time spent: >35 minutes     [name] Sheets  [facility] Pager [phone]. If 7PM-7AM, please contact night-coverage at [email or web address], password [facility] 03/14/2013, 8:00 AM  LOS: 2 days

## 2013-03-14 NOTE — Progress Notes (Signed)
     [facility] Daily Rounding Note 03/14/2013, 3:21 PM  LOS: 2 days   SUBJECTIVE:       Feels better after Zofran.  No BMs.  Some abdominal pain, generalized got worse after swallowing barium  OBJECTIVE:         Vital signs in last 24 hours:    Temp:  [98.1 F (36.7 C)-98.3 F (36.8 C)] 98.3 F (36.8 C) (11/06 1346) Pulse Rate:  [67-75] 67 (11/06 1346) Resp:  [18] 18 (11/06 1346) BP: (98-121)/(54-60) 112/57 mmHg (11/06 1346) SpO2:  [97 %-98 %] 97 % (11/06 1346) Last BM Date: 03/11/13 General: comfortable, looks better   Heart: RRR Chest: clear bil Abdomen: soft, ND, active BS, tender globally: mild without guard or rebound  Extremities: no CCE  Neuro/Psych:  Pleasant, relaxed. No confusion.   Intake/Output from previous day: 11/05 0701 - 11/06 0700 In: 240 [P.O.:240] Out: 2000 [Urine:2000]  Intake/Output this shift: Total I/O In: 0  Out: 250 [Urine:250]  Lab Results:  Recent Labs  03/12/13 0100 03/12/13 0720  WBC 7.0 6.0  HGB 13.6 13.4  HCT 39.3 39.1  PLT 374 372   BMET  Recent Labs  03/12/13 0100 03/12/13 0720  NA 137 136  K 3.1* 3.9  CL 99 101  CO2 24 23  GLUCOSE 125* 117*  BUN 12 12  CREATININE 0.80 0.80  CALCIUM 10.0 9.6   LFT  Recent Labs  03/12/13 0720  PROT 7.2  ALBUMIN 3.9  AST 17  ALT 12  ALKPHOS 68  BILITOT 0.8  BILIDIR 0.2  IBILI 0.6   PT/INR No results found for this basename: LABPROT, INR,  in the last 72 hours Hepatitis Panel No results found for this basename: HEPBSAG, HCVAB, HEPAIGM, HEPBIGM,  in the last 72 hours  Studies/Results: Dg Ugi W/high Density W/kub 03/14/2013     FLUOROSCOPY TIME:  2 min 1 second  FINDINGS: The Nissen fundoplication has become incompetent and there is a recurrent large hiatal hernia. The mucosa of the esophagus is normal. The patient has multiple tertiary contractions in the distal 2/3 of the esophagus. There is no stricture. A 13 mm barium tablet passed immediately from the mouth to the  stomach with no delay.  The stomach appears normal other than the hiatal hernia. The antrum, pylorus, duodenal bulb, and C-loop and proximal small bowel appear normal. There was rapid emptying of the stomach.  IMPRESSION: The Nissen fundoplication has become a incompetent with a recurrent large hiatal hernia.   Electronically Signed   By: Geanie Cooley M.D.   On: 03/14/2013 15:08    ASSESMENT:  * Nausea, vomiting which coincides with addition of hydrocodone to her meds. Suspect direct s/e of the meds and/or narcotic induces gastroparesis.  S/p 05/2011 Lap Nissen which by today's UGI series has become incompetent and she has recurrent hernia.  *  Dysphagia, tertiary contractions on UGI * Monoclonal Gammopathy, no evidence of MM to explain multiple recent fractures. Dr Shirline Frees following.     PLAN  *  Scheduled anti-emetics *  Called surgical referral, will see her today.   *  Restart soft diet.    [name] [name]  03/14/2013, 3:21 PM Pager: [phone]

## 2013-03-15 ENCOUNTER — Encounter (HOSPITAL_COMMUNITY): Payer: Self-pay | Admitting: Internal Medicine

## 2013-03-15 ENCOUNTER — Encounter (HOSPITAL_COMMUNITY)
Admission: EM | Disposition: A | Discharge status: Home / Self Care | Payer: Self-pay | Source: Home / Self Care | Attending: Internal Medicine

## 2013-03-15 DIAGNOSIS — K449 Diaphragmatic hernia without obstruction or gangrene: Secondary | ICD-10-CM

## 2013-03-15 HISTORY — PX: ESOPHAGOGASTRODUODENOSCOPY: SHX5428

## 2013-03-15 SURGERY — EGD (ESOPHAGOGASTRODUODENOSCOPY)
Anesthesia: Moderate Sedation

## 2013-03-15 MED ORDER — DIPHENHYDRAMINE HCL 50 MG/ML IJ SOLN
INTRAMUSCULAR | Status: AC
  Filled 2013-03-15: qty 1

## 2013-03-15 MED ORDER — FLUCONAZOLE 100 MG PO TABS
100.0000 mg | ORAL_TABLET | Freq: Once | ORAL | Status: AC
  Administered 2013-03-15: 100 mg via ORAL
  Filled 2013-03-15 (×2): qty 1

## 2013-03-15 MED ORDER — FENTANYL CITRATE 0.05 MG/ML IJ SOLN
INTRAMUSCULAR | Status: DC | PRN
  Administered 2013-03-15 (×2): 25 ug via INTRAVENOUS

## 2013-03-15 MED ORDER — FLUCONAZOLE 100 MG PO TABS
100.0000 mg | ORAL_TABLET | Freq: Every day | ORAL | Status: DC
  Administered 2013-03-16: 100 mg via ORAL
  Filled 2013-03-15: qty 1

## 2013-03-15 MED ORDER — MIDAZOLAM HCL 5 MG/ML IJ SOLN
INTRAMUSCULAR | Status: AC
  Filled 2013-03-15: qty 2

## 2013-03-15 MED ORDER — DIPHENHYDRAMINE HCL 50 MG/ML IJ SOLN
INTRAMUSCULAR | Status: DC | PRN
  Administered 2013-03-15: 12.5 mg via INTRAVENOUS

## 2013-03-15 MED ORDER — SODIUM CHLORIDE 0.9 % IV SOLN: INTRAVENOUS | Status: DC

## 2013-03-15 MED ORDER — MIDAZOLAM HCL 10 MG/2ML IJ SOLN
INTRAMUSCULAR | Status: DC | PRN
  Administered 2013-03-15: 1 mg via INTRAVENOUS
  Administered 2013-03-15 (×2): 2 mg via INTRAVENOUS
  Administered 2013-03-15 (×2): 1 mg via INTRAVENOUS

## 2013-03-15 MED ORDER — SENNA 8.6 MG PO TABS
1.0000 | ORAL_TABLET | Freq: Every day | ORAL | Status: DC
  Administered 2013-03-15 – 2013-03-16 (×2): 8.6 mg via ORAL
  Filled 2013-03-15 (×2): qty 1

## 2013-03-15 MED ORDER — BISACODYL 10 MG RE SUPP
10.0000 mg | Freq: Once | RECTAL | Status: AC
  Administered 2013-03-15: 10 mg via RECTAL
  Filled 2013-03-15: qty 1

## 2013-03-15 MED ORDER — DOCUSATE SODIUM 100 MG PO CAPS
100.0000 mg | ORAL_CAPSULE | Freq: Two times a day (BID) | ORAL | Status: DC
  Administered 2013-03-15 – 2013-03-16 (×3): 100 mg via ORAL
  Filled 2013-03-15 (×4): qty 1

## 2013-03-15 MED ORDER — FENTANYL CITRATE 0.05 MG/ML IJ SOLN
INTRAMUSCULAR | Status: AC
  Filled 2013-03-15: qty 2

## 2013-03-15 MED ORDER — BUTAMBEN-TETRACAINE-BENZOCAINE 2-2-14 % EX AERO
INHALATION_SPRAY | CUTANEOUS | Status: DC | PRN
  Administered 2013-03-15: 1 via TOPICAL

## 2013-03-15 NOTE — Op Note (Signed)
[facility] [address] [place], [address]   ENDOSCOPY PROCEDURE REPORT  PATIENT: [name], [name]  MR#: [id number] BIRTHDATE: [birth year] , 77  yrs. old GENDER: Female ENDOSCOPIST: Beverley Fiedler, MD REFERRED BY:  Claud Kelp, M.D. PROCEDURE DATE:  03/15/2013 PROCEDURE:  EGD w/ biopsy ASA CLASS:     Class III INDICATIONS:  Nausea.   Vomiting.   Dysphagia. MEDICATIONS: These medications were titrated to patient response per physician's verbal order, Diphenhydramine (Benadryl) 12.5 mg IV, Fentanyl 50 mcg IV, and Versed 6 mg IV TOPICAL ANESTHETIC: Cetacaine Spray  DESCRIPTION OF PROCEDURE: After the risks benefits and alternatives of the procedure were thoroughly explained, informed consent was obtained.  The PENTAX GASTOROSCOPE [id number] endoscope was introduced through the mouth and advanced to the second portion of the duodenum. Without limitations.  The instrument was slowly withdrawn as the mucosa was fully examined.     ESOPHAGUS: White exudates consistent with candidiasis were found in the entire esophagus.   The GE junction is located 34 cm from the incisors.  STOMACH: A recurrent hiatal hernia (4-5 cm) in size and evidence of prior surgical intervention was found in the cardia. There was an isolated small, nonbleeding, Cameron's erosion at the diaphragmatic hiatus.   The stomach otherwise appeared normal.  Multiple biopsies obtained from the mid and distal stomach to exclude H. pyloric  DUODENUM: The duodenal mucosa showed no abnormalities in the bulb and second portion of the duodenum.  Retroflexed views revealed a hiatal hernia as previously described. The scope was then withdrawn from the patient and the procedure completed.  COMPLICATIONS: There were no complications.  ENDOSCOPIC IMPRESSION: 1.   White exudates consistent with candidiasis 2.   Slipped Nissen fundoplication was found in the cardia with recurrent 4-5 cm hernia.   Small hiatal hernia. 3.   The stomach otherwise appeared normal 4.   The duodenal mucosa showed no abnormalities in the bulb and second portion of the duodenum   RECOMMENDATIONS: 1.  Daily PPI 2.  Fluconazole 200 mg by mouth x 1, then 100 mg by mouth x 13 days 3.  3.  Further plans per Dr.  Derrell Lolling  eSigned:  Beverley Fiedler, MD 03/15/2013 2:11 PM   CC:The Patient and Claud Kelp, MD  PATIENT NAME:  [name] [name], [name] [name] MR#: [id number]

## 2013-03-15 NOTE — Progress Notes (Signed)
Pt set up for EGD at 130 this afternoon.  D/w pt and she is agreeable.   Jennye Moccasin  PA-C

## 2013-03-15 NOTE — Consult Note (Addendum)
Reason for Consult:Evaluate recurrent hiatal hernia. Referring Physician: Erick Blinks, M.D.  [name] [name] is an 77 y.o. female.  HPI:  This 77 year old female is known to me from prior antireflux surgery. On 06/03/2011 she underwent reduction and repair of a hiatal hernia and a loose Nissen fundoplication. Diaphragm hiatus was primarily repaired with sutures and she did well. Upper GI on 08/04/2011 looked good with good positioning of the wrap. She has mild esophageal dysmotility.  She now complains of 8 week history of nausea and vomiting and constipation. She was admitted on 03/12/2013 with chest pain along with nausea and vomiting but the pain has resolved. She states she still feels nauseated and can't eat very well. Myocardial infarction and pulmonary and wasn't have been ruled out. Cardiology has signed off.  Upper GI was recently performed and shows a slippage of the repair. There is a type I slippage with as much as one third of the stomach up in the chest. The wrap appears to be at the GE junction(?) but there is no obstruction, no volvulus. There is an indentation of the upper body that looks like a diaphragmatic imprint. The recurrence  may be causing her nausea and, but the absence of mechanical problems raised question as to whether there is another etiology.  I suspect that if we cannot get her asymptomatic on medical management  she will need to have this taken down and revised.  Comorbidities include coronary artery disease with stenting 2003. History of aspiration pneumonia. Asthma, which has been stable. Spinal stenosis and chronic back pain. Lumbar surgery February 2014. Cholecystectomy. Appendectomy. Monoclonal gammopathy, uncertain significance.Hypertension. Past history esophageal stricture.  Past Medical History  Diagnosis Date  . CAD (coronary artery disease)     [facility] 2003 with 40% pLAD, 30% mLAD, 100% D1 (moderate vessel), 100%  D2 (small vessel), 95% mid small OM2.  Pt had PTCA  to D1;  D2 and OM2 small vessels and tx medically;  Last Myoview (2012): anterior infarct seen with scar, no ischemia. EF normal. Pt managed medically  . Recurrent aspiration bronchitis/pneumonia     PFTs 12/09 FVC 97%, FEV1 101%, ratio 77%, TLC 109%. No significant obstruction or restriction.   . Hypertension   . Hyperlipidemia   . Esophageal stricture   . Diverticulosis of colon   . Gastroesophageal reflux disease with hiatal hernia   . Iron deficiency anemia   . Gastritis   . GERD (gastroesophageal reflux disease)   . Hiatal hernia   . Asthma   . Blood transfusion   . Cough   . Wheezing   . Weakness   . Trouble swallowing   . Abdominal pain   . Hiatal hernia   . Shortness of breath     Past Surgical History  Procedure Laterality Date  . Back surgery    . Cholecystectomy    . Bladder repair    . Hemorrhoid surgery    . Cholecystectomy      Patient unsure of date.  [name] Appendectomy      patient unsure of date  . Total abdominal hysterectomy  1975    partial  . Coronary angioplasty  11/16/2001  . Eye surgery  11/2000    bilateral cataracts with lens implant  . Hiatal hernia repair  06/03/2011    Procedure: LAPAROSCOPIC REPAIR OF HIATAL HERNIA;  Surgeon: Ernestene Mention, MD;  Location: [facility] ORS;  Service: General;  Laterality: N/A;  . Laparoscopic nissen fundoplication  06/03/2011    Procedure: LAPAROSCOPIC  NISSEN FUNDOPLICATION;  Surgeon: Ernestene Mention, MD;  Location: [facility] ORS;  Service: General;  Laterality: N/A;  . Lumbar laminectomy/decompression microdiscectomy Right 06/26/2012    Procedure: LUMBAR LAMINECTOMY/DECOMPRESSION MICRODISCECTOMY;  Surgeon: Kerrin Champagne, MD;  Location: [facility] OR;  Service: Orthopedics;  Laterality: Right;  RIGHT L4-5 MICRODISCECTOMY    Family History  Problem Relation Age of Onset  . Cancer Mother     colon   . Colon cancer Mother 65  . Heart disease Father 79    heart attack    Social History:  reports that she has never smoked. She has  never used smokeless tobacco. She reports that she does not drink alcohol or use illicit drugs.  Allergies:  Allergies  Allergen Reactions  . Aspartame Diarrhea and Nausea And Vomiting  . Daliresp [Roflumilast] Nausea And Vomiting    Medications: I have reviewed the patient's current medications.  No results found for this or any previous visit (from the past 48 hour(s)).  Dg Ugi W/high Density W/kub  03/14/2013   CLINICAL DATA:  Nausea and vomiting. Previous Nissen fundoplication.  EXAM: UPPER GI SERIES W/HIGH DENSITY W/KUB  TECHNIQUE: After obtaining a scout radiograph a routine upper GI series was performed using thin and high density barium and a 13 mm barium tablet.  COMPARISON:  Upper GI dated 06/04/2011  FLUOROSCOPY TIME:  2 min 1 second  FINDINGS: The Nissen fundoplication has become incompetent and there is a recurrent large hiatal hernia. The mucosa of the esophagus is normal. The patient has multiple tertiary contractions in the distal 2/3 of the esophagus. There is no stricture. A 13 mm barium tablet passed immediately from the mouth to the stomach with no delay.  The stomach appears normal other than the hiatal hernia. The antrum, pylorus, duodenal bulb, and C-loop and proximal small bowel appear normal. There was rapid emptying of the stomach.  IMPRESSION: The Nissen fundoplication has become a incompetent with a recurrent large hiatal hernia.   Electronically Signed   By: Geanie Cooley M.D.   On: 03/14/2013 15:08    Blood pressure 119/60, pulse 78, temperature 98 F (36.7 C), temperature source Oral, resp. rate 18, height 5\' 3"  (1.6 m), weight 153 lb 12.8 oz (69.763 kg), SpO2 98.00%.    EXAM: General:Alert. Oriented. Seems depressed, anxious, No obvious physical distress, but she is in mild emotional distress. Head: Pupils equally reactive to light and accommodation. No swelling or asymmetry.  Eyes: No icterus or pallor  Ears: Does not appear hard of hearing.  Mouth: Moist,  clear oral mm  Neck: No mass or tenderness  Lungs: Clear. No cough.  Heart: RRR.No ectopy. No murmur. Abdomen: Abdomen is soft, nondistended, obese, generally nontender. Scars appear well healed. No hernias noted. Bowel sounds active. Rectal: not done  Musc/Skeltl: Moves all 4 extremities to command. Extremities: No pedal edema  Neurologic: Pleasant, alert. Oriented x 3.  Nodes: No cervical adenopathy  Psych: Pleasant, somewhat depressed and anxious.    Assessment/Plan: Slippage and recurrence of her hiatal hernia. This may be contributing to her nausea. If we cannot eliminate her symptoms with medical management then  it will have to be revised surgically. At this point in time there is no acute surgical need, Since there is no mechanical obstruction, perforation, or apparent ischemia,  but her nausea is quite problematic.  It is surprising that she has so much nausea with this. There is no obstruction. There is no volvulus. Absence of mechanical issues suggest the possibility  of other etiology, including narcotic medication.  At this point in time I would suggest an upper endoscopy to make sure that we're not missing some mechanical pathology not elucidated by the upper GI.  Would also like to get a gastric emptying study, either before or after EGD.  If we could resolve her nausea, then she could be discharged home and consider revisional surgery electively. It is not clear whether this will be possible or not.  Superimposed anxiety and depression.  Coronary artery disease, status post stent 2003  Monoclonal gammopathy of uncertain significance.  History recurrent aspiration bronchitis, pneumonia, and asthma  Hypertension  Remote history of esophageal stricture  Status post cholecystectomy  Status post abdominal hysterectomy  Status post appendectomy     Dreon Pineda M. Derrell Lolling, M.D., [facility] General and Minimally invasive Surgery Breast and  Colorectal Surgery Office:   [phone] Pager:   [phone]  03/15/2013, 6:08 AM

## 2013-03-15 NOTE — Progress Notes (Signed)
Patient ID: [name] [name], female   DOB: [birth year], 77 y.o.   MRN: [id number]   SUBJECTIVE: No further chest pain. She is still nauseated with abdominal soreness.  No appetite.  [name] amLODipine  10 mg Oral q morning - 10a  . aspirin EC  325 mg Oral Daily  . calcitonin (salmon)  1 spray Alternating Nares q morning - 10a  . cholecalciferol  4,000 Units Oral Daily   And  . cholecalciferol  2,000 Units Oral QHS  . docusate sodium  100 mg Oral BID  . enoxaparin (LOVENOX) injection  40 mg Subcutaneous Q24H  . feeding supplement (RESOURCE BREEZE)  1 Container Oral Q1500  . losartan  100 mg Oral q morning - 10a  . metoprolol tartrate  12.5 mg Oral QHS  . mometasone-formoterol  2 puff Inhalation BID  . multivitamin with minerals  1 tablet Oral q morning - 10a  . nitroGLYCERIN  0.4 mg Transdermal Daily  . ondansetron  4 mg Intravenous Q6H  . pantoprazole  40 mg Oral BID  . senna  1 tablet Oral Daily  . simvastatin  20 mg Oral q1800  . tiotropium  18 mcg Inhalation Daily    Filed Vitals:   03/14/13 2120 03/15/13 0543 03/15/13 0750 03/15/13 1037  BP:  119/60  138/76  Pulse: 72 78  86  Temp:  98 F (36.7 C)    TempSrc:  Oral    Resp:  18    Height:      Weight:      SpO2:  98% 99%     Intake/Output Summary (Last 24 hours) at 03/15/13 1047 Last data filed at 03/15/13 0614  Gross per 24 hour  Intake 1551.25 ml  Output   1200 ml  Net 351.25 ml    LABS: Basic Metabolic Panel: No results found for this basename: NA, K, CL, CO2, GLUCOSE, BUN, CREATININE, CALCIUM, MG, PHOS,  in the last 72 hours Liver Function Tests: No results found for this basename: AST, ALT, ALKPHOS, BILITOT, PROT, ALBUMIN,  in the last 72 hours No results found for this basename: LIPASE, AMYLASE,  in the last 72 hours CBC: No results found for this basename: WBC, NEUTROABS, HGB, HCT, MCV, PLT,  in the last 72 hours Cardiac Enzymes:  Recent Labs  03/12/13 1057 03/12/13 1730  TROPONINI <0.30 <0.30   BNP: No  components found with this basename: POCBNP,  D-Dimer: No results found for this basename: DDIMER,  in the last 72 hours PHYSICAL EXAM General: NAD Neck: No JVD, no thyromegaly or thyroid nodule.  Lungs: Clear to auscultation bilaterally with normal respiratory effort. CV: Nondisplaced PMI.  Heart regular S1/S2, no S3/S4, no murmur.  No peripheral edema.  No carotid bruit.  Normal pedal pulses.  Abdomen: Soft, mild diffuse tenderness, no hepatosplenomegaly, no distention.  Neurologic: Alert and oriented x 3.  Psych: Normal affect. Extremities: No clubbing or cyanosis.   TELEMETRY: Reviewed telemetry pt in NSR, 1 episode of nsVT - total 6 beats at 00:30 am the last night   ASSESSMENT AND PLAN:  77 yo with with history of CAD s/p prior PCI presented with nausea, vomiting, abdominal soreness, and chest pain.   1. CAD: Per patient, chest pain began after nausea and vomiting.  She is pain-free now.  Cardiac enzymes were negative despite prolonged pain.  Echo showed no wall motion abnormalities.  I think that the chest pain is noncardiac, potentially related to nausea/vomiting.  When she is discharged, we will arrange stress  test.  Continue prior to admission cardiac meds.   2. nsVT on telemetry on 11/6,on Metoprolol as tolerated by BP, no more episodes  3. Nausea/abdominal discomfort: EGD today   Please call cardiology with further questions.  Let us know when she goes home so stress test and followup can be arranged.   [name] [name], H 03/15/2013 10:47 AM

## 2013-03-15 NOTE — Progress Notes (Addendum)
03/15/2013 1700 4mg  of Zofran wasted in sharps witnessed by Lonia Blood RN due to syringe dropped on floor. New vial drawn from Pyxis and old syringe wasted in Pyxis machine.  Javanna Patin, Blanchard Kelch

## 2013-03-15 NOTE — Progress Notes (Signed)
[facility] PROGRESS NOTE  [name] [name] ZOX:[id number] DOB: 11/09/1935 DOA: 03/12/2013 PCP: [name], MD  Assessment/Plan: 77 y.o. female with known history of CAD status post stenting, hypertension, hyperlipidemia, GERD admitted with chest pain   1. Chest epigastric pain likely due to Slippage and recurrence of her hiatal hernia; h/o reduction and repair of hiatal hernia, laparoscopic Nissen fundoplication in January 2013 -s/p dilatation in 2012; hiatal hernia; GERD -per surgery: need EGD, surgery elective outpatient; cont pain control; PPI, IVF, antiemetics;  -defer management to GI, and surgery;   2. CAD s/p PTCA [facility] 2003 with 40% pLAD, 30% mLAD, 100% D1 (moderate vessel), 100% D2 (small vessel), 95% mid small OM2. Pt had PTCA to D1; D2 and OM2 small vessels and tx medically; Last Myoview (5/09): EF 66%, anteroseptal/apical infarct with minimal peri-infarct ischemia. No significant change from 11/06); has risk factors, h/o CAD PTCA; CTA: no PE -echo: LVEF 60%, no wal motion abnormalities; ECG, initial trop unremarkable; cont NTG, statin, ASA, monitor on tele;  -c/s cardiology: may need stress test outpatient; appreciate cardiology eval;   3. HTN cont home meds;    Code Status: full Family Communication: d/w husband at the bedside (indicate person spoken with, relationship, and if by phone, the number) Disposition Plan: home when ready    Consultants:  Cardiology   Procedures:  CTA  Antibiotics:  None  (indicate start date, and stop date if known)  HPI/Subjective: Alert  Objective: Filed Vitals:   03/15/13 0543  BP: 119/60  Pulse: 78  Temp: 98 F (36.7 C)  Resp: 18    Intake/Output Summary (Last 24 hours) at 03/15/13 0822 Last data filed at 03/15/13 8119  Gross per 24 hour  Intake 1551.25 ml  Output   1200 ml  Net 351.25 ml   Filed Weights   03/12/13 0345 03/12/13 0524  Weight: 69.4 kg (153 lb) 69.763 kg (153 lb 12.8 oz)     Exam:   General:  alert  Cardiovascular: s1,s2 rrr  Respiratory: CTA BL   Abdomen: soft, nt, nd   Musculoskeletal: no le edema    Data Reviewed: Basic Metabolic Panel:  Recent Labs Lab 03/12/13 0100 03/12/13 0720  NA 137 136  K 3.1* 3.9  CL 99 101  CO2 24 23  GLUCOSE 125* 117*  BUN 12 12  CREATININE 0.80 0.80  CALCIUM 10.0 9.6   Liver Function Tests:  Recent Labs Lab 03/12/13 0720  AST 17  ALT 12  ALKPHOS 68  BILITOT 0.8  PROT 7.2  ALBUMIN 3.9    Recent Labs Lab 03/12/13 0720  LIPASE 12   No results found for this basename: AMMONIA,  in the last 168 hours CBC:  Recent Labs Lab 03/12/13 0100 03/12/13 0720  WBC 7.0 6.0  NEUTROABS 3.9 2.9  HGB 13.6 13.4  HCT 39.3 39.1  MCV 85.4 85.2  PLT 374 372   Cardiac Enzymes:  Recent Labs Lab 03/12/13 0720 03/12/13 1057 03/12/13 1730  TROPONINI <0.30 <0.30 <0.30   BNP (last 3 results) No results found for this basename: PROBNP,  in the last 8760 hours CBG: No results found for this basename: GLUCAP,  in the last 168 hours  Recent Results (from the past 240 hour(s))  CULTURE, BLOOD (ROUTINE X 2)     Status: None   Collection Time    03/12/13  1:45 AM      Result Value Range Status   Specimen Description BLOOD LEFT FOREARM   Final   Special Requests  BOTTLES DRAWN AEROBIC ONLY 8CC   Final   Culture  Setup Time     Final   Value: 03/12/2013 03:58     Performed at [facility]   Culture     Final   Value:        BLOOD CULTURE RECEIVED NO GROWTH TO DATE CULTURE WILL BE HELD FOR 5 DAYS BEFORE ISSUING A FINAL NEGATIVE REPORT     Performed at [facility]   Report Status PENDING   Incomplete  CULTURE, BLOOD (ROUTINE X 2)     Status: None   Collection Time    03/12/13  2:00 AM      Result Value Range Status   Specimen Description BLOOD RIGHT HAND   Final   Special Requests BOTTLES DRAWN AEROBIC ONLY 3CC   Final   Culture  Setup Time     Final   Value: 03/12/2013 08:07      Performed at [facility]   Culture     Final   Value:        BLOOD CULTURE RECEIVED NO GROWTH TO DATE CULTURE WILL BE HELD FOR 5 DAYS BEFORE ISSUING A FINAL NEGATIVE REPORT     Performed at [facility]   Report Status PENDING   Incomplete     Studies: Dg Ugi W/high Density W/kub  03/14/2013   CLINICAL DATA:  Nausea and vomiting. Previous Nissen fundoplication.  EXAM: UPPER GI SERIES W/HIGH DENSITY W/KUB  TECHNIQUE: After obtaining a scout radiograph a routine upper GI series was performed using thin and high density barium and a 13 mm barium tablet.  COMPARISON:  Upper GI dated 06/04/2011  FLUOROSCOPY TIME:  2 min 1 second  FINDINGS: The Nissen fundoplication has become incompetent and there is a recurrent large hiatal hernia. The mucosa of the esophagus is normal. The patient has multiple tertiary contractions in the distal 2/3 of the esophagus. There is no stricture. A 13 mm barium tablet passed immediately from the mouth to the stomach with no delay.  The stomach appears normal other than the hiatal hernia. The antrum, pylorus, duodenal bulb, and C-loop and proximal small bowel appear normal. There was rapid emptying of the stomach.  IMPRESSION: The Nissen fundoplication has become a incompetent with a recurrent large hiatal hernia.   Electronically Signed   By: Geanie Cooley M.D.   On: 03/14/2013 15:08    Scheduled Meds: . amLODipine  10 mg Oral q morning - 10a  . aspirin EC  325 mg Oral Daily  . calcitonin (salmon)  1 spray Alternating Nares q morning - 10a  . cholecalciferol  4,000 Units Oral Daily   And  . cholecalciferol  2,000 Units Oral QHS  . enoxaparin (LOVENOX) injection  40 mg Subcutaneous Q24H  . feeding supplement (RESOURCE BREEZE)  1 Container Oral Q1500  . losartan  100 mg Oral q morning - 10a  . metoprolol tartrate  12.5 mg Oral QHS  . mometasone-formoterol  2 puff Inhalation BID  . multivitamin with minerals  1 tablet Oral q morning - 10a  . nitroGLYCERIN   0.4 mg Transdermal Daily  . ondansetron  4 mg Intravenous Q6H  . pantoprazole  40 mg Oral BID  . simvastatin  20 mg Oral q1800  . tiotropium  18 mcg Inhalation Daily   Continuous Infusions: . dextrose 5 % and 0.9 % NaCl with KCl 20 mEq/L 75 mL/hr at 03/14/13 2341    Principal Problem:   Chest  pain Active Problems:   HYPERLIPIDEMIA   HYPERTENSION   CAD, UNSPECIFIED SITE    Time spent: >35 minutes     [name] Sheets  [facility] Pager [phone]. If 7PM-7AM, please contact night-coverage at [email or web address], password [email or web address] 03/15/2013, 8:22 AM  LOS: 3 days

## 2013-03-15 NOTE — Progress Notes (Signed)
Plan for EGD at 13:00 today

## 2013-03-16 MED ORDER — DSS 100 MG PO CAPS: 100.0000 mg | ORAL_CAPSULE | Freq: Two times a day (BID) | ORAL | Status: DC

## 2013-03-16 MED ORDER — FLUCONAZOLE 100 MG PO TABS: 100.0000 mg | ORAL_TABLET | Freq: Every day | ORAL | Status: DC

## 2013-03-16 MED ORDER — PROMETHAZINE HCL 12.5 MG PO TABS: 12.5000 mg | ORAL_TABLET | Freq: Four times a day (QID) | ORAL | Status: DC | PRN

## 2013-03-16 MED ORDER — HYDROCODONE-ACETAMINOPHEN 7.5-325 MG PO TABS: 1.0000 | ORAL_TABLET | ORAL | Status: DC | PRN

## 2013-03-16 MED ORDER — SENNA 8.6 MG PO TABS: 1.0000 | ORAL_TABLET | Freq: Every day | ORAL | Status: DC

## 2013-03-16 MED ORDER — METOCLOPRAMIDE HCL 10 MG PO TABS: 10.0000 mg | ORAL_TABLET | Freq: Four times a day (QID) | ORAL | Status: DC | PRN

## 2013-03-16 MED ORDER — PROMETHAZINE HCL 12.5 MG RE SUPP: 12.5000 mg | Freq: Four times a day (QID) | RECTAL | Status: DC | PRN

## 2013-03-16 MED ORDER — ASPIRIN EC 81 MG PO TBEC: 81.0000 mg | DELAYED_RELEASE_TABLET | Freq: Every day | ORAL | Status: DC

## 2013-03-16 MED ORDER — BOOST / RESOURCE BREEZE PO LIQD: 1.0000 | Freq: Every day | ORAL | Status: DC

## 2013-03-16 NOTE — Discharge Summary (Signed)
Physician Discharge Summary  [name] [name] JYN:[id number] DOB: December 09, 1935 DOA: 03/12/2013  PCP: [name], MD  Admit date: 03/12/2013 Discharge date: 03/16/2013  Time spent: >35 minutes  Recommendations for Outpatient Follow-up:  F/u with Dr. Derrell Lolling next week for elective surgery  F/u with cardiologist to scheduled outpatient stress test  F/u with PCP next week   Discharge Diagnoses:  Principal Problem:   Chest pain Active Problems:   HYPERLIPIDEMIA   HYPERTENSION   CAD, UNSPECIFIED SITE   Discharge Condition: stable  Diet recommendation: heart healthy   Filed Weights   03/12/13 0345 03/12/13 0524  Weight: 69.4 kg (153 lb) 69.763 kg (153 lb 12.8 oz)    History of present illness:  77 y.o. female with known history of CAD status post stenting, hypertension, hyperlipidemia, GERD admitted with chest pain   Hospital Course:  1. Chest epigastric pain likely due to Slippage and recurrence of her hiatal hernia; h/o reduction and repair of hiatal hernia, laparoscopic Nissen fundoplication in January 2013; s/p dilatation in 2012; hiatal hernia; GERD  -s/p upper GI series: The Nissen fundoplication has become a incompetent with a recurrent large hiatal hernia. -s/p EGD: candidiasis, slipped N.fundoplication; recommended PPI, antifungals;  -per surgery: surgery elective outpatient; cont pain control; PPI, antiemetics; f/u with Dr. Derrell Lolling   2. CAD s/p PTCA [facility] 2003 with 40% pLAD, 30% mLAD, 100% D1 (moderate vessel), 100% D2 (small vessel), 95% mid small OM2. Pt had PTCA to D1; D2 and OM2 small vessels and tx medically; Last Myoview (5/09): EF 66%, anteroseptal/apical infarct with minimal peri-infarct ischemia. No significant change from 11/06); has risk factors, h/o CAD PTCA; CTA: no PE  -echo: LVEF 60%, no wal motion abnormalities; ECG, initial trop unremarkable; cont NTG, statin, ASA; per  cardiology: stress test outpatient;    Procedures:  EGD: as above  (i.e. Studies  not automatically included, echos, thoracentesis, etc; not x-rays)  Consultations:  GI, sugery, cardiology   Discharge Exam: Filed Vitals:   03/16/13 0423  BP: 129/71  Pulse: 79  Temp: 98.2 F (36.8 C)  Resp: 17    General: alert Cardiovascular: s1,s2 rrr Respiratory: CTA BL   Discharge Instructions  Discharge Orders   Future Appointments Provider Department Dept Phone   08/26/2013 9:30 AM [facility] Only [facility] [phone]   08/28/2013 9:30 AM Si Gaul, MD [facility] [phone]   Future Orders Complete By Expires   Diet - low sodium heart healthy  As directed    Discharge instructions  As directed    Comments:     Please follow up with Dr. Derrell Lolling next week  [address] [place] [address] [phone]  Please also follow up with cardiologist to schedule outpatient stress test   Increase activity slowly  As directed        Medication List         ALPRAZolam 0.5 MG tablet  Commonly known as:  XANAX  Take 0.5 mg by mouth 3 (three) times daily as needed for sleep or anxiety.     amLODipine 10 MG tablet  Commonly known as:  NORVASC  Take 10 mg by mouth every morning.     aspirin EC 81 MG tablet  Take 1 tablet (81 mg total) by mouth daily.     calcitonin (salmon) 200 UNIT/ACT nasal spray  Commonly known as:  MIACALCIN/FORTICAL  Place 1 spray into the nose every morning. She alternates nostrils every other day.  DSS 100 MG Caps  Take 100 mg by mouth 2 (two) times daily.     feeding supplement (RESOURCE BREEZE) Liqd  Take 1 Container by mouth daily at 3 pm.     fluconazole 100 MG tablet  Commonly known as:  DIFLUCAN  Take 1 tablet (100 mg total) by mouth daily.     gabapentin 300 MG capsule  Commonly known as:  NEURONTIN  Take 300 mg by mouth at bedtime as needed (For pain.).     hydrochlorothiazide 25 MG tablet  Commonly known as:  HYDRODIURIL  Take 25 mg  by mouth daily with breakfast.     HYDROcodone-acetaminophen 7.5-325 MG per tablet  Commonly known as:  NORCO  Take 1 tablet by mouth every 3 (three) hours as needed.     losartan 100 MG tablet  Commonly known as:  COZAAR  Take 100 mg by mouth every morning.     metoCLOPramide 10 MG tablet  Commonly known as:  REGLAN  Take 1 tablet (10 mg total) by mouth every 6 (six) hours as needed (Nausea).     metoprolol tartrate 25 MG tablet  Commonly known as:  LOPRESSOR  Take 12.5 mg by mouth at bedtime.     mometasone-formoterol 200-5 MCG/ACT Aero  Commonly known as:  DULERA  Inhale 2 puffs into the lungs 2 (two) times daily.     multivitamin with minerals Tabs tablet  Take 1 tablet by mouth every morning.     OVER THE COUNTER MEDICATION  Take 1,000 mg by mouth every other day. Coral Calcium 1000mg      OVER THE COUNTER MEDICATION  Take 400 mg by mouth 3 (three) times daily. Magnesium Carbonate 400mg      pantoprazole 40 MG tablet  Commonly known as:  PROTONIX  Take 40 mg by mouth 2 (two) times daily.     pravastatin 40 MG tablet  Commonly known as:  PRAVACHOL  Take 40 mg by mouth at bedtime.     promethazine 12.5 MG suppository  Commonly known as:  PHENERGAN  Place 1 suppository (12.5 mg total) rectally every 6 (six) hours as needed for nausea.     promethazine 12.5 MG tablet  Commonly known as:  PHENERGAN  Take 1 tablet (12.5 mg total) by mouth every 6 (six) hours as needed for nausea.     senna 8.6 MG Tabs tablet  Commonly known as:  SENOKOT  Take 1 tablet (8.6 mg total) by mouth daily.     TUDORZA PRESSAIR 400 MCG/ACT Aepb  Generic drug:  Aclidinium Bromide  Inhale 1 puff into the lungs 2 (two) times daily.     VITAMIN B 12 PO  Take 1 tablet by mouth every morning.     Vitamin D3 2000 UNITS Tabs  Take 2,000-4,000 Units by mouth 2 (two) times daily. She takes two tablets in the morning and one tablet at bedtime.       Allergies  Allergen Reactions  . Aspartame  Diarrhea and Nausea And Vomiting  . Daliresp [Roflumilast] Nausea And Vomiting       Follow-up Information   Follow up with Ernestene Mention, MD. Schedule an appointment as soon as possible for a visit in 2 days.   Specialty:  General Surgery   Contact information:   [address] [place] [address] [phone]       Follow up with [name], MD In 1 week.   Specialty:  Internal Medicine   Contact information:  [address] [place] [address] [phone]       Follow up with Tobias Alexander, H, MD. Schedule an appointment as soon as possible for a visit in 1 week.   Specialty:  Cardiology   Contact information:   [address] [place] [address] [phone]        The results of significant diagnostics from this hospitalization (including imaging, microbiology, ancillary and laboratory) are listed below for reference.    Significant Diagnostic Studies: Dg Chest 2 View  03/12/2013   CLINICAL DATA:  Chest tightness  EXAM: CHEST  2 VIEW  COMPARISON:  Chest x-ray 06/25/2012.  FINDINGS: Lung volumes are normal. Mild eventration the right hemidiaphragm is unchanged. No consolidative airspace disease. No pleural effusions. No pneumothorax. No pulmonary nodule or mass noted. Pulmonary vasculature and the cardiomediastinal silhouette are within normal limits.  IMPRESSION: 1.  No radiographic evidence of acute cardiopulmonary disease.   Electronically Signed   By: Trudie Reed M.D.   On: 03/12/2013 02:47   Ct Angio Chest Pe W/cm &/or Wo Cm  03/12/2013   CLINICAL DATA:  Chest pain.  EXAM: CT ANGIOGRAPHY CHEST WITH CONTRAST  TECHNIQUE: Multidetector CT imaging of the chest was performed using the standard protocol during bolus administration of intravenous contrast. Multiplanar CT image reconstructions including MIPs were obtained to evaluate the vascular anatomy.  CONTRAST:  OMNIPAQUE IOHEXOL 350 MG/ML SOLN   COMPARISON:  Chest CT 03/01/2011.  FINDINGS: Mediastinum: There are no filling defects within the arterial tree to suggest underlying embolism. Heart size is normal. There is no significant pericardial fluid, thickening or pericardial calcification. There is atherosclerosis of the thoracic aorta, the great vessels of the mediastinum and the coronary arteries, including calcified atherosclerotic plaque in the left main, left anterior descending, left circumflex and right coronary arteries. Curvilinear hypoattenuation throughout the anterior apical lateral wall segments, suggestive of myocardial infarctions in the LAD and potential left circumflex coronary artery territories. No pathologically enlarged mediastinal or hilar lymph nodes. Large hiatal hernia  Lungs/Pleura: No acute consolidative airspace disease. No pleural effusions. No pneumothorax. No definite suspicious appearing pulmonary nodules or masses are identified at this.  Upper Abdomen: Unremarkable.  Musculoskeletal: There are no aggressive appearing lytic or blastic lesions noted in the visualized portions of the skeleton.  Review of the MIP images confirms the above findings.  IMPRESSION: 1. No evidence of pulmonary embolism. 2. No acute findings in the thorax to account for patient's symptoms. 3. Atherosclerosis, including left main and 3 vessel coronary artery disease. Evidence of prior myocardial infarctions, as above. 4. Large hiatal hernia.   Electronically Signed   By: Trudie Reed M.D.   On: 03/12/2013 04:31   Ct Biopsy  02/26/2013   *RADIOLOGY REPORT*  Indication: Multiple myeloma  CT GUIDED LEFT ILIAC BONE MARROW ASPIRATION AND BONE MARROW CORE BIOPSIES  Intravenous medications: Fentanyl 150 mcg IV; Versed 3 mg IV  Sedation time: 10 minutes  Contrast volume: None  Complications: None immediate  PROCEDURE/FINDINGS:  Informed consent was obtained from the patient following an explanation of the procedure, risks, benefits and alternatives.  The patient understands, agrees and consents for the procedure. All questions were addressed.  A time out was performed prior to the initiation of the procedure.  The patient was positioned prone and noncontrast localization CT was performed of the pelvis to demonstrate the iliac marrow spaces.  The operative site was prepped and draped in the usual sterile fashion.  Under sterile conditions  and local anesthesia, an 11 gauge coaxial bone biopsy needle was advanced into the left iliac marrow space. Needle position was confirmed with CT imaging.  Initially, bone marrow aspiration was performed. Next, a bone marrow biopsy was obtained with the 11 gauge outer bone marrow device.  Samples were prepared with the cytotechnologist and deemed adequate.  The needle was removed intact.  Hemostasis was obtained with compression and a dressing was placed. The patient tolerated the procedure well without immediate post procedural complication.  IMPRESSION:  Successful CT guided left iliac bone marrow aspiration and core biopsies.   Original Report Authenticated By: Tacey Ruiz, MD   Dg Bone Survey Met  02/26/2013   CLINICAL DATA:  Multiple myeloma.  EXAM: METASTATIC BONE SURVEY  COMPARISON:  Multiple exams, including 11/19/2008  FINDINGS: Calvarial thickening noted, but with the without well-defined focal myeloma lesions in the calvarium.  Known compression fractures at L1 and L3. L4 and L5 laminectomies. Anterolisthesis at L4-5.  Suspected hiatal hernia.  IMPRESSION: 1. No focal myelomatous lesions are identified. 2. Known compression fractures at L1 and L3. 3. Hiatal hernia.   Electronically Signed   By: Herbie Baltimore M.D.   On: 02/26/2013 09:15   Dg Ugi W/high Density W/kub  03/14/2013   CLINICAL DATA:  Nausea and vomiting. Previous Nissen fundoplication.  EXAM: UPPER GI SERIES W/HIGH DENSITY W/KUB  TECHNIQUE: After obtaining a scout radiograph a routine upper GI series was performed using thin and high density  barium and a 13 mm barium tablet.  COMPARISON:  Upper GI dated 06/04/2011  FLUOROSCOPY TIME:  2 min 1 second  FINDINGS: The Nissen fundoplication has become incompetent and there is a recurrent large hiatal hernia. The mucosa of the esophagus is normal. The patient has multiple tertiary contractions in the distal 2/3 of the esophagus. There is no stricture. A 13 mm barium tablet passed immediately from the mouth to the stomach with no delay.  The stomach appears normal other than the hiatal hernia. The antrum, pylorus, duodenal bulb, and C-loop and proximal small bowel appear normal. There was rapid emptying of the stomach.  IMPRESSION: The Nissen fundoplication has become a incompetent with a recurrent large hiatal hernia.   Electronically Signed   By: Geanie Cooley M.D.   On: 03/14/2013 15:08    Microbiology: Recent Results (from the past 240 hour(s))  CULTURE, BLOOD (ROUTINE X 2)     Status: None   Collection Time    03/12/13  1:45 AM      Result Value Range Status   Specimen Description BLOOD LEFT FOREARM   Final   Special Requests BOTTLES DRAWN AEROBIC ONLY 8CC   Final   Culture  Setup Time     Final   Value: 03/12/2013 03:58     Performed at [facility]   Culture     Final   Value:        BLOOD CULTURE RECEIVED NO GROWTH TO DATE CULTURE WILL BE HELD FOR 5 DAYS BEFORE ISSUING A FINAL NEGATIVE REPORT     Performed at [facility]   Report Status PENDING   Incomplete  CULTURE, BLOOD (ROUTINE X 2)     Status: None   Collection Time    03/12/13  2:00 AM      Result Value Range Status   Specimen Description BLOOD RIGHT HAND   Final   Special Requests BOTTLES DRAWN AEROBIC ONLY 3CC   Final   Culture  Setup Time  Final   Value: 03/12/2013 08:07     Performed at [facility]   Culture     Final   Value:        BLOOD CULTURE RECEIVED NO GROWTH TO DATE CULTURE WILL BE HELD FOR 5 DAYS BEFORE ISSUING A FINAL NEGATIVE REPORT     Performed at [facility]    Report Status PENDING   Incomplete     Labs: Basic Metabolic Panel:  Recent Labs Lab 03/12/13 0100 03/12/13 0720  NA 137 136  K 3.1* 3.9  CL 99 101  CO2 24 23  GLUCOSE 125* 117*  BUN 12 12  CREATININE 0.80 0.80  CALCIUM 10.0 9.6   Liver Function Tests:  Recent Labs Lab 03/12/13 0720  AST 17  ALT 12  ALKPHOS 68  BILITOT 0.8  PROT 7.2  ALBUMIN 3.9    Recent Labs Lab 03/12/13 0720  LIPASE 12   No results found for this basename: AMMONIA,  in the last 168 hours CBC:  Recent Labs Lab 03/12/13 0100 03/12/13 0720  WBC 7.0 6.0  NEUTROABS 3.9 2.9  HGB 13.6 13.4  HCT 39.3 39.1  MCV 85.4 85.2  PLT 374 372   Cardiac Enzymes:  Recent Labs Lab 03/12/13 0720 03/12/13 1057 03/12/13 1730  TROPONINI <0.30 <0.30 <0.30   BNP: BNP (last 3 results) No results found for this basename: PROBNP,  in the last 8760 hours CBG: No results found for this basename: GLUCAP,  in the last 168 hours     Signed:  [name] Sheets  [facility] 03/16/2013, 8:29 AM

## 2013-03-16 NOTE — Progress Notes (Signed)
Patient ID: [name] [name], female   DOB: 1935-06-05, 77 y.o.   MRN: [id number]  Awaiting results of gastric emptying study

## 2013-03-16 NOTE — Discharge Instructions (Signed)
Hiatal Hernia A hiatal hernia occurs when a part of the stomach slides above the diaphragm. The diaphragm is the thin muscle separating the belly (abdomen) from the chest. A hiatal hernia can be something you are born with or develop over time. Hiatal hernias may allow stomach acid to flow back into your esophagus, the tube which carries food from your mouth to your stomach. If this acid causes problems it is called GERD (gastro-esophageal reflux disease).  SYMPTOMS  Common symptoms of GERD are heartburn (burning in your chest). This is worse when lying down or bending over. It may also cause belching and indigestion. Some of the things which make GERD worse are:  Increased weight pushes on stomach making acid rise more easily.  Smoking markedly increases acid production.  Alcohol decreases lower esophageal sphincter pressure (valve between stomach and esophagus), allowing acid from stomach into esophagus.  Late evening meals and going to bed with a full stomach increases pressure.  Anything that causes an increase in acid production.  Lower esophageal sphincter incompetence. DIAGNOSIS  Hiatal hernia is often diagnosed with x-rays of your stomach and small bowel. This is called an UGI (upper gastrointestinal x-ray). Sometimes a gastroscopic procedure is done. This is a procedure where your caregiver uses a flexible instrument to look into the stomach and small bowel. HOME CARE INSTRUCTIONS   Try to achieve and maintain an ideal body weight.  Avoid drinking alcoholic beverages.  Stop smoking.  Put the head of your bed on 4 to 6 inch blocks. This will keep your head and esophagus higher than your stomach. If you cannot use blocks, sleep with several pillows under your head and shoulders.  Over-the-counter medications will decrease acid production. Your caregiver can also prescribe medications for this. Take as directed.  1/2 to 1 teaspoon of an antacid taken every hour while awake, with  meals and at bedtime, will neutralize acid.  Do not take aspirin, ibuprofen (Advil or Motrin), or other nonsteroidal anti-inflammatory drugs.  Do not wear tight clothing around your chest or stomach.  Eat smaller meals and eat more frequently. This keeps your stomach from getting too full. Eat slowly.  Do not lie down for 2 or 3 hours after eating. Do not eat or drink anything 1 to 2 hours before going to bed.  Avoid caffeine beverages (colas, coffee, cocoa, tea), fatty foods, citrus fruits and all other foods and drinks that contain acid and that seem to increase the problems.  Avoid bending over, especially after eating. Also avoid straining during bowel movements or when urinating or lifting things. Anything that increases the pressure in your belly increases the amount of acid that may be pushed up into your esophagus. SEEK IMMEDIATE MEDICAL CARE IF:  There is change in location (pain in arms, neck, jaw, teeth or back) of your pain, or the pain is getting worse.  You also experience nausea, vomiting, sweating (diaphoresis), or shortness of breath.  You develop continual vomiting, vomit blood or coffee ground material, have bright red blood in your stools, or have black tarry stools. Some of these symptoms could signal other problems such as heart disease. MAKE SURE YOU:   Understand these instructions.  Monitor your condition.  Contact your caregiver if you are not doing well or are getting worse. Document Released: 07/16/2003 Document Revised: 07/18/2011 Document Reviewed: 04/25/2005 [employer] Patient Information 2014 [employer].  

## 2013-03-16 NOTE — Progress Notes (Signed)
Discharge instructions given to pt and pt's husband along with prescriptions. Pt is stable for discharge.

## 2013-03-18 ENCOUNTER — Encounter (HOSPITAL_COMMUNITY): Payer: Self-pay | Admitting: Internal Medicine

## 2013-03-18 ENCOUNTER — Telehealth (INDEPENDENT_AMBULATORY_CARE_PROVIDER_SITE_OTHER): Payer: Self-pay

## 2013-03-18 ENCOUNTER — Telehealth (INDEPENDENT_AMBULATORY_CARE_PROVIDER_SITE_OTHER): Payer: Self-pay | Admitting: *Deleted

## 2013-03-18 ENCOUNTER — Other Ambulatory Visit (INDEPENDENT_AMBULATORY_CARE_PROVIDER_SITE_OTHER): Payer: Self-pay

## 2013-03-18 ENCOUNTER — Telehealth: Payer: Self-pay | Admitting: Internal Medicine

## 2013-03-18 DIAGNOSIS — K449 Diaphragmatic hernia without obstruction or gangrene: Secondary | ICD-10-CM

## 2013-03-18 LAB — CULTURE, BLOOD (ROUTINE X 2)
Culture: NO GROWTH
Culture: NO GROWTH

## 2013-03-18 NOTE — Telephone Encounter (Signed)
I called pt to inform her of her NM gastric emptying test at [facility] on 03/19/13 with an arrival time of 6:45am.  I instructed the pt not to take any stomach  meds from now until after the procedure.  Also informed pt to be NPO after midnight.  I notified pt of her appt with Dr. Derrell Lolling on 03/22/13 with an arrival time of 2:30pm.  Pt agreeable.

## 2013-03-18 NOTE — Telephone Encounter (Signed)
Patient spouse Gerlene Burdock calling in for appointment.  Patient was recently hospitalized and results of EGD shows the Nissen fundoplication has become a incompetent with a recurrent large hiatal hernia.  Front desk offered an appointment for 03/28/13 but, spouse report patient is having a lot of discomfort and he would like to see if she can be worked in for an earlier appointment.  Please advise

## 2013-03-18 NOTE — Telephone Encounter (Signed)
I will bring her in Friday for a 3 pm appointment so that gives [place] time to get the gastric emptying study.

## 2013-03-18 NOTE — Telephone Encounter (Signed)
recc she try OTC Zantac/Ranitidine 150 mg take 1-2 tabs twice/da am and pm

## 2013-03-18 NOTE — Telephone Encounter (Signed)
PT WAS TOLD AT HOSPITAL TO GET OTC ACID REFLUX MEDICINE.  SHE WANTS YOUR RECCOMENDATION OR AN RX. PLEASE ADVISE

## 2013-03-19 ENCOUNTER — Encounter (HOSPITAL_COMMUNITY)
Admission: RE | Admit: 2013-03-19 | Discharge: 2013-03-19 | Disposition: A | Discharge status: Home / Self Care | Payer: Medicare Other | Source: Ambulatory Visit | Attending: General Surgery | Admitting: General Surgery

## 2013-03-19 ENCOUNTER — Encounter: Payer: Self-pay | Admitting: Internal Medicine

## 2013-03-19 DIAGNOSIS — K449 Diaphragmatic hernia without obstruction or gangrene: Secondary | ICD-10-CM | POA: Insufficient documentation

## 2013-03-19 MED ORDER — TECHNETIUM TC 99M SULFUR COLLOID
2.0000 | Freq: Once | INTRAVENOUS | Status: AC | PRN
  Administered 2013-03-19: 2 via INTRAVENOUS

## 2013-03-20 ENCOUNTER — Telehealth: Payer: Self-pay | Admitting: Gastroenterology

## 2013-03-20 ENCOUNTER — Other Ambulatory Visit: Payer: Self-pay

## 2013-03-20 ENCOUNTER — Telehealth (INDEPENDENT_AMBULATORY_CARE_PROVIDER_SITE_OTHER): Payer: Self-pay

## 2013-03-20 DIAGNOSIS — K449 Diaphragmatic hernia without obstruction or gangrene: Secondary | ICD-10-CM

## 2013-03-20 NOTE — Telephone Encounter (Signed)
I called and left a message to Dr Marzetta Board nurse to see if he could set up an esophageal manometry asap for large recurrent hiatal hernia.

## 2013-03-20 NOTE — Telephone Encounter (Signed)
The gastric emptying study is normal. I called [name] [name] and she is still nauseated but is able to tolerate full liquids. I discussed a gastric emptying study with her. I told her we would like to schedule her for an esophageal manometry. I told her that I would like Dr. Michaell Cowing to perform her surgery and I will assist him. The patient readily agreed to this.  I have discussed this with Dr. Michaell Cowing in he is in agreement.    Ned Clines. is going to schedule the manometry and  reschedule her office visit with Dr. Michaell Cowing.  Angelia Mould. Derrell Lolling, M.D., [facility] General and Minimally invasive Surgery Breast and Colorectal Surgery Office:   [phone] Pager:   [phone]

## 2013-03-20 NOTE — Telephone Encounter (Signed)
Pt scheduled for Esophageal manometry at [facility] 03/25/13@9 :30am. Pt aware of appt. And instructions.

## 2013-03-20 NOTE — Telephone Encounter (Signed)
[name] [name] has set up [name] [name] for esophageal Manometry on Monday 11/17 at 0930 [facility].   Pt is aware.

## 2013-03-21 NOTE — Telephone Encounter (Signed)
Called pt to give her the appt to see Dr Michaell Cowing for 04/03/13 arrive at 10:45 to discuss surgery. The pt understands.

## 2013-03-22 ENCOUNTER — Encounter (INDEPENDENT_AMBULATORY_CARE_PROVIDER_SITE_OTHER): Payer: Medicare Other | Admitting: General Surgery

## 2013-03-22 ENCOUNTER — Ambulatory Visit: Payer: Self-pay | Admitting: *Deleted

## 2013-03-22 DIAGNOSIS — Z23 Encounter for immunization: Secondary | ICD-10-CM

## 2013-03-25 ENCOUNTER — Ambulatory Visit (HOSPITAL_COMMUNITY)
Admission: RE | Admit: 2013-03-25 | Discharge: 2013-03-25 | Disposition: A | Discharge status: Home / Self Care | Payer: Medicare Other | Source: Ambulatory Visit | Attending: Gastroenterology | Admitting: Gastroenterology

## 2013-03-25 ENCOUNTER — Encounter (HOSPITAL_COMMUNITY)
Admission: RE | Disposition: A | Discharge status: Home / Self Care | Payer: Self-pay | Source: Ambulatory Visit | Attending: Gastroenterology

## 2013-03-25 DIAGNOSIS — R1310 Dysphagia, unspecified: Secondary | ICD-10-CM | POA: Insufficient documentation

## 2013-03-25 DIAGNOSIS — K224 Dyskinesia of esophagus: Secondary | ICD-10-CM | POA: Insufficient documentation

## 2013-03-25 HISTORY — PX: ESOPHAGEAL MANOMETRY: SHX5429

## 2013-03-25 SURGERY — MANOMETRY, ESOPHAGUS
Anesthesia: Topical

## 2013-03-25 MED ORDER — LIDOCAINE VISCOUS 2 % MT SOLN
OROMUCOSAL | Status: AC
  Filled 2013-03-25: qty 15

## 2013-03-26 ENCOUNTER — Encounter (HOSPITAL_COMMUNITY): Payer: Self-pay | Admitting: Gastroenterology

## 2013-03-28 ENCOUNTER — Encounter (HOSPITAL_COMMUNITY): Payer: Self-pay

## 2013-04-03 ENCOUNTER — Ambulatory Visit (INDEPENDENT_AMBULATORY_CARE_PROVIDER_SITE_OTHER): Payer: Medicare Other | Admitting: Surgery

## 2013-04-03 ENCOUNTER — Encounter (INDEPENDENT_AMBULATORY_CARE_PROVIDER_SITE_OTHER): Payer: Self-pay

## 2013-04-03 ENCOUNTER — Encounter (INDEPENDENT_AMBULATORY_CARE_PROVIDER_SITE_OTHER): Payer: Self-pay | Admitting: Surgery

## 2013-04-03 VITALS — BP 98/58 | HR 80 | Temp 98.9°F | Resp 15 | Ht 65.0 in | Wt 152.4 lb

## 2013-04-03 DIAGNOSIS — K440 Diaphragmatic hernia with obstruction, without gangrene: Secondary | ICD-10-CM

## 2013-04-03 DIAGNOSIS — K5900 Constipation, unspecified: Secondary | ICD-10-CM

## 2013-04-03 DIAGNOSIS — F411 Generalized anxiety disorder: Secondary | ICD-10-CM

## 2013-04-03 DIAGNOSIS — R112 Nausea with vomiting, unspecified: Secondary | ICD-10-CM

## 2013-04-03 DIAGNOSIS — K5909 Other constipation: Secondary | ICD-10-CM | POA: Insufficient documentation

## 2013-04-03 DIAGNOSIS — F419 Anxiety disorder, unspecified: Secondary | ICD-10-CM

## 2013-04-03 DIAGNOSIS — R079 Chest pain, unspecified: Secondary | ICD-10-CM

## 2013-04-03 NOTE — Progress Notes (Signed)
Subjective:     Patient ID: [name] [name], female   DOB: 26-Mar-1936, 77 y.o.   MRN: [id number]  HPI  [name] [name]  Oct 07, 1935 [id number]  Patient Care Team: Lucky Cowboy, MD as PCP - General (Internal Medicine) Louis Meckel, MD as Consulting Physician (Gastroenterology) Oretha Milch, MD as Consulting Physician (Pulmonary Disease) Laurey Morale, MD as Consulting Physician (Cardiology) Ardeth Sportsman, MD as Consulting Physician (General Surgery) Kerrin Champagne, MD as Consulting Physician (Orthopedic Surgery)  This patient is a 77 y.o.female who presents today for surgical evaluation at the request of Dr. Derrell Lolling.   Reason for visit: Recurrent Hiatal hernia with nausea/vomiting/pain  Pleasant woman comes today with her husband.  Suffers from chronic anxiety and pain.  Had a paraesophageal hiatal hernia repaired laparoscopically January 2013.  Recovered well.  Followup swallow study a few months later showed no recurrence.  Unfortunately, patient developed an episode of severe chest pain.  Nausea and vomiting.  Feels it is related to some type of flu bug although no one else was sick.  Worsened.  Was admitted to the hospital earlier this month.  Cardiac etiology seemed unlikely.  Workup revealed evidence of recurrent hiatal hernia.  Surgical consultation requested.  Patient stabilized.  Was able to advanced to liquids.  Sent home.  She has been struggling with dysphasia since then.  Tolerating occasional solid foods but otherwise dysphasia to more solid material.  Occasional reflux and heartburn.  Back on her Protonix.  Takes Rolaids rather regularly.  That helps.  Bulging much more.  No hyper flatulence.  She feels rundown and a little cold.  No history of fall or trauma.  No personal nor family history of GI/colon cancer, inflammatory bowel disease, irritable bowel syndrome, allergy such as Celiac Sprue, dietary/dairy problems, colitis, ulcers nor gastritis.  No recent sick  contacts/gastroenteritis.  No travel outside the country.  No changes in diet.  Normally struggles with chronic constipation.  Bowels more loose since she has been on primarily a liquid diet.  She recently fell and strained her hip.  Being followed by Dr. Otelia Sergeant for that as well.   No exertional chest/neck/shoulder/arm pain.  Patient can walk 20 minutes for about 1 miles without difficulty.      Patient Active Problem List   Diagnosis Date Noted  . Constipation, chronic 04/03/2013  . Chest pain 03/12/2013  . MGUS (monoclonal gammopathy of unknown significance) 03/05/2013  . Herniated lumbar intervertebral disc 06/25/2012    Class: Acute  . Vertebral compression fracture 06/23/2012  . Acute back pain with sciatica 06/20/2012  . GASTRITIS 05/13/2010  . CAD, UNSPECIFIED SITE 08/28/2008  . ESOPHAGEAL STRICTURE 08/28/2008  . DIVERTICULOSIS, COLON 08/28/2008  . ASTHMA 07/29/2008  . HIATAL HERNIA WITH REFLUX 01/31/2008  . HYPERTENSION 07/25/2007  . BRONCHITIS, ACUTE 07/25/2007  . HYPERLIPIDEMIA 07/24/2007  . IRON DEFICIENCY ANEMIA, HX OF 07/24/2007    Past Medical History  Diagnosis Date  . CAD (coronary artery disease)     LHC 2003 with 40% pLAD, 30% mLAD, 100% D1 (moderate vessel), 100%  D2 (small vessel), 95% mid small OM2.  Pt had PTCA to D1;  D2 and OM2 small vessels and tx medically;  Last Myoview (2012): anterior infarct seen with scar, no ischemia. EF normal. Pt managed medically  . Recurrent aspiration bronchitis/pneumonia     PFTs 12/09 FVC 97%, FEV1 101%, ratio 77%, TLC 109%. No significant obstruction or restriction.   . Hypertension   . Hyperlipidemia   .  Esophageal stricture   . Diverticulosis of colon   . Gastroesophageal reflux disease with hiatal hernia   . Iron deficiency anemia   . Gastritis   . GERD (gastroesophageal reflux disease)   . Hiatal hernia   . Asthma   . Blood transfusion   . Cough   . Wheezing   . Weakness   . Trouble swallowing   . Abdominal  pain   . Hiatal hernia   . Shortness of breath     Past Surgical History  Procedure Laterality Date  . Back surgery    . Cholecystectomy    . Bladder repair    . Hemorrhoid surgery    . Cholecystectomy      Patient unsure of date.  [name] Appendectomy      patient unsure of date  . Total abdominal hysterectomy  1975    partial  . Coronary angioplasty  11/16/2001  . Eye surgery  11/2000    bilateral cataracts with lens implant  . Hiatal hernia repair  06/03/2011    Procedure: LAPAROSCOPIC REPAIR OF HIATAL HERNIA;  Surgeon: Ernestene Mention, MD;  Location: [facility] ORS;  Service: General;  Laterality: N/A;  . Laparoscopic nissen fundoplication  06/03/2011    Procedure: LAPAROSCOPIC NISSEN FUNDOPLICATION;  Surgeon: Ernestene Mention, MD;  Location: [facility];  Service: General;  Laterality: N/A;  . Lumbar laminectomy/decompression microdiscectomy Right 06/26/2012    Procedure: LUMBAR LAMINECTOMY/DECOMPRESSION MICRODISCECTOMY;  Surgeon: Kerrin Champagne, MD;  Location: [facility] OR;  Service: Orthopedics;  Laterality: Right;  RIGHT L4-5 MICRODISCECTOMY  . Esophagogastroduodenoscopy N/A 03/15/2013    Procedure: ESOPHAGOGASTRODUODENOSCOPY (EGD);  Surgeon: Beverley Fiedler, MD;  Location: [facility] ENDOSCOPY;  Service: Endoscopy;  Laterality: N/A;  . Esophageal manometry N/A 03/25/2013    Procedure: ESOPHAGEAL MANOMETRY (EM);  Surgeon: Louis Meckel, MD;  Location: [facility] ENDOSCOPY;  Service: Endoscopy;  Laterality: N/A;    History   Social History  . Marital Status: Married    Spouse Name: N/A    Number of Children: N/A  . Years of Education: N/A   Occupational History  . Retired     worked at [employer] History Main Topics  . Smoking status: Never Smoker   . Smokeless tobacco: Never Used  . Alcohol Use: No  . Drug Use: No  . Sexual Activity: Not on file   Other Topics Concern  . Not on file   Social History Narrative   Lives with husband.    Family History  Problem Relation Age of Onset   . Cancer Mother     colon   . Colon cancer Mother 59  . Heart disease Father 71    heart attack    Current Outpatient Prescriptions  Medication Sig Dispense Refill  . Aclidinium Bromide (TUDORZA PRESSAIR) 400 MCG/ACT AEPB Inhale 1 puff into the lungs 2 (two) times daily.      [name] ALPRAZolam (XANAX) 0.5 MG tablet Take 0.5 mg by mouth 3 (three) times daily as needed for sleep or anxiety.       [name] amLODipine (NORVASC) 10 MG tablet Take 10 mg by mouth every morning.       [name] aspirin EC 81 MG tablet Take 1 tablet (81 mg total) by mouth daily.  30 tablet  0  . calcitonin, salmon, (MIACALCIN/FORTICAL) 200 UNIT/ACT nasal spray Place 1 spray into the nose every morning. She alternates nostrils every other day.      . Cholecalciferol (VITAMIN D3) 2000 UNITS  TABS Take 2,000-4,000 Units by mouth 2 (two) times daily. She takes two tablets in the morning and one tablet at bedtime.      . Cyanocobalamin (VITAMIN B 12 PO) Take 1 tablet by mouth every morning.       . docusate sodium 100 MG CAPS Take 100 mg by mouth 2 (two) times daily.  10 capsule  0  . feeding supplement, RESOURCE BREEZE, (RESOURCE BREEZE) LIQD Take 1 Container by mouth daily at 3 pm.  1 Container  0  . hydrochlorothiazide 25 MG tablet Take 25 mg by mouth daily with breakfast.       . HYDROcodone-acetaminophen (NORCO) 7.5-325 MG per tablet Take 1 tablet by mouth every 3 (three) hours as needed.  30 tablet  0  . losartan (COZAAR) 100 MG tablet Take 100 mg by mouth every morning.       . metoprolol tartrate (LOPRESSOR) 25 MG tablet Take 12.5 mg by mouth at bedtime.      . mometasone-formoterol (DULERA) 200-5 MCG/ACT AERO Inhale 2 puffs into the lungs 2 (two) times daily.      . Multiple Vitamin (MULTIVITAMIN WITH MINERALS) TABS Take 1 tablet by mouth every morning.       [name] OVER THE COUNTER MEDICATION Take 1,000 mg by mouth every other day. Coral Calcium 1000mg       . OVER THE COUNTER MEDICATION Take 400 mg by mouth 3 (three) times daily.  Magnesium Carbonate 400mg       . pantoprazole (PROTONIX) 40 MG tablet Take 40 mg by mouth 2 (two) times daily.      . pravastatin (PRAVACHOL) 40 MG tablet Take 40 mg by mouth at bedtime.       . promethazine (PHENERGAN) 12.5 MG tablet Take 1 tablet (12.5 mg total) by mouth every 6 (six) hours as needed for nausea.  12 tablet  0  . gabapentin (NEURONTIN) 300 MG capsule Take 300 mg by mouth at bedtime as needed (For pain.).       [name] metoCLOPramide (REGLAN) 10 MG tablet Take 1 tablet (10 mg total) by mouth every 6 (six) hours as needed (Nausea).  20 tablet  0  . senna (SENOKOT) 8.6 MG TABS tablet Take 1 tablet (8.6 mg total) by mouth daily.  120 each  0   No current facility-administered medications for this visit.     Allergies  Allergen Reactions  . Aspartame Diarrhea and Nausea And Vomiting  . Daliresp [Roflumilast] Nausea And Vomiting  . Ace Inhibitors Cough    Per Dr Sherene Sires pulmonology 2013    BP 98/58  Pulse 80  Temp(Src) 98.9 F (37.2 C) (Temporal)  Resp 15  Ht 5\' 5"  (1.651 m)  Wt 152 lb 6.4 oz (69.128 kg)  BMI 25.36 kg/m2  Dg Chest 2 View  03/12/2013   CLINICAL DATA:  Chest tightness  EXAM: CHEST  2 VIEW  COMPARISON:  Chest x-ray 06/25/2012.  FINDINGS: Lung volumes are normal. Mild eventration the right hemidiaphragm is unchanged. No consolidative airspace disease. No pleural effusions. No pneumothorax. No pulmonary nodule or mass noted. Pulmonary vasculature and the cardiomediastinal silhouette are within normal limits.  IMPRESSION: 1.  No radiographic evidence of acute cardiopulmonary disease.   Electronically Signed   By: Trudie Reed M.D.   On: 03/12/2013 02:47   Ct Angio Chest Pe W/cm &/or Wo Cm  03/12/2013   CLINICAL DATA:  Chest pain.  EXAM: CT ANGIOGRAPHY CHEST WITH CONTRAST  TECHNIQUE: Multidetector CT imaging of the chest  was performed using the standard protocol during bolus administration of intravenous contrast. Multiplanar CT image reconstructions including MIPs  were obtained to evaluate the vascular anatomy.  CONTRAST:  OMNIPAQUE IOHEXOL 350 MG/ML SOLN  COMPARISON:  Chest CT 03/01/2011.  FINDINGS: Mediastinum: There are no filling defects within the arterial tree to suggest underlying embolism. Heart size is normal. There is no significant pericardial fluid, thickening or pericardial calcification. There is atherosclerosis of the thoracic aorta, the great vessels of the mediastinum and the coronary arteries, including calcified atherosclerotic plaque in the left main, left anterior descending, left circumflex and right coronary arteries. Curvilinear hypoattenuation throughout the anterior apical lateral wall segments, suggestive of myocardial infarctions in the LAD and potential left circumflex coronary artery territories. No pathologically enlarged mediastinal or hilar lymph nodes. Large hiatal hernia  Lungs/Pleura: No acute consolidative airspace disease. No pleural effusions. No pneumothorax. No definite suspicious appearing pulmonary nodules or masses are identified at this.  Upper Abdomen: Unremarkable.  Musculoskeletal: There are no aggressive appearing lytic or blastic lesions noted in the visualized portions of the skeleton.  Review of the MIP images confirms the above findings.  IMPRESSION: 1. No evidence of pulmonary embolism. 2. No acute findings in the thorax to account for patient's symptoms. 3. Atherosclerosis, including left main and 3 vessel coronary artery disease. Evidence of prior myocardial infarctions, as above. 4. Large hiatal hernia.   Electronically Signed   By: Trudie Reed M.D.   On: 03/12/2013 04:31   Nm Gastric Emptying  03/19/2013   CLINICAL DATA:  History of abdominal pain, nausea, vomiting and bloating. History of hiatal hernia and reflux. 2 mCi  EXAM: NUCLEAR MEDICINE GASTRIC EMPTYING SCAN  TECHNIQUE: After oral ingestion of radiolabeled meal, sequential abdominal images were obtained for 120 minutes. Residual percentage of  activity remaining within the stomach was calculated at 60 and 120 minutes.  COMPARISON:  None.  RADIOPHARMACEUTICALS:  2 mCi ofTechnetium 52m labeled sulfur colloid  FINDINGS: Expected location of the stomach in the left upper quadrant. Ingested meal empties the stomach gradually over the course of the study with 41 % retention at 60 min and 22 % retention at 120 min (normal retention less than 30% at a 120 min).  IMPRESSION: Normal gastric emptying study.   Electronically Signed   By: Onalee Hua  Call M.D.   On: 03/19/2013 09:37   Dg Ugi W/high Density W/kub  03/14/2013   CLINICAL DATA:  Nausea and vomiting. Previous Nissen fundoplication.  EXAM: UPPER GI SERIES W/HIGH DENSITY W/KUB  TECHNIQUE: After obtaining a scout radiograph a routine upper GI series was performed using thin and high density barium and a 13 mm barium tablet.  COMPARISON:  Upper GI dated 06/04/2011  FLUOROSCOPY TIME:  2 min 1 second  FINDINGS: The Nissen fundoplication has become incompetent and there is a recurrent large hiatal hernia. The mucosa of the esophagus is normal. The patient has multiple tertiary contractions in the distal 2/3 of the esophagus. There is no stricture. A 13 mm barium tablet passed immediately from the mouth to the stomach with no delay.  The stomach appears normal other than the hiatal hernia. The antrum, pylorus, duodenal bulb, and C-loop and proximal small bowel appear normal. There was rapid emptying of the stomach.  IMPRESSION: The Nissen fundoplication has become a incompetent with a recurrent large hiatal hernia.   Electronically Signed   By: Geanie Cooley M.D.   On: 03/14/2013 15:08     Review of Systems  Constitutional: Positive  for chills and appetite change. Negative for fever, diaphoresis and fatigue.  HENT: Negative for ear discharge, ear pain, sore throat and trouble swallowing.   Eyes: Negative for photophobia, discharge and visual disturbance.  Respiratory: Negative for cough, choking, chest  tightness, shortness of breath and wheezing.   Cardiovascular: Positive for chest pain. Negative for palpitations and leg swelling.  Gastrointestinal: Positive for nausea and vomiting. Negative for abdominal pain, diarrhea, constipation, anal bleeding and rectal pain.  Endocrine: Negative for cold intolerance and heat intolerance.  Genitourinary: Negative for dysuria, frequency and difficulty urinating.  Musculoskeletal: Positive for arthralgias, back pain and myalgias. Negative for gait problem and neck pain.  Skin: Negative for color change, pallor and rash.  Allergic/Immunologic: Negative for environmental allergies, food allergies and immunocompromised state.  Neurological: Negative for dizziness, tremors, syncope, speech difficulty and numbness.  Hematological: Negative for adenopathy.  Psychiatric/Behavioral: Negative for confusion, decreased concentration and agitation. The patient is nervous/anxious.        Objective:   Physical Exam  Constitutional: She is oriented to person, place, and time. She appears well-developed and well-nourished. No distress.  HENT:  Head: Normocephalic.  Mouth/Throat: Oropharynx is clear and moist. No oropharyngeal exudate.  Eyes: Conjunctivae and EOM are normal. Pupils are equal, round, and reactive to light. No scleral icterus.  Neck: Normal range of motion. Neck supple. No tracheal deviation present.  Cardiovascular: Normal rate, regular rhythm and intact distal pulses.   Pulmonary/Chest: Effort normal and breath sounds normal. No stridor. No respiratory distress. She exhibits tenderness. She exhibits no mass, no bony tenderness, no laceration and no deformity.  Abdominal: Soft. She exhibits no distension and no mass. There is tenderness in the epigastric area. There is no rigidity, no rebound, no guarding, no tenderness at McBurney's point and negative Murphy's sign. No hernia. Hernia confirmed negative in the ventral area, confirmed negative in the  right inguinal area and confirmed negative in the left inguinal area.  Genitourinary: No vaginal discharge found.  Musculoskeletal: Normal range of motion. She exhibits no tenderness.       Right elbow: She exhibits normal range of motion.       Left elbow: She exhibits normal range of motion.       Right wrist: She exhibits normal range of motion.       Left wrist: She exhibits normal range of motion.       Right hand: Normal strength noted.       Left hand: Normal strength noted.  Lymphadenopathy:       Head (right side): No posterior auricular adenopathy present.       Head (left side): No posterior auricular adenopathy present.    She has no cervical adenopathy.    She has no axillary adenopathy.       Right: No inguinal adenopathy present.       Left: No inguinal adenopathy present.  Neurological: She is alert and oriented to person, place, and time. No cranial nerve deficit. She exhibits normal muscle tone. Coordination normal.  Skin: Skin is warm and dry. No rash noted. She is not diaphoretic. No erythema.  Psychiatric: She has a normal mood and affect. Her behavior is normal. Judgment and thought content normal.       Assessment:     Recurrent paraesophageal hiatal hernia with nausea and vomiting, intermittent chest pain, intermittent dysphasia.  Most likely slipped wrap fundoplication.     Plan:     Because she is severely symptomatic despite maximal medical  therapy, and recent hospitalization.  I  think she would benefit from surgical repair.  I think it is doable to sort out laparoscopically with possible conversion to open.  Anticipate mesh reinforcement of the hiatal closure.  Possible narrow Ultrapro patch with biologic larger mesh.  I would have endoscopy available.  Patient requested Dr. Wenda Low or Claud Kelp to be able to assist if possible, but she does not want to delay her surgery an extra month or so to ensure 100% chance of this.  With this being a  reoperative case, her operative risks including esophageal and gastric injury with leak and tubes/drains higher.  However, I do not see any other good options.  She wishes to be aggressive and proceed with surgery.  Her husband does as well:  The anatomy & physiology of the foregut and anti-reflux mechanism was discussed.  The pathophysiology of hiatal herniation and GERD was discussed.  Natural history risks without surgery was discussed.   The patient's symptoms are not adequately controlled by medicines and other non-operative treatments.  I feel the risks of no intervention will lead to serious problems that outweigh the operative risks; therefore, I recommended surgery to reduce the hiatal hernia out of the chest and fundoplication to rebuild the anti-reflux valve and control reflux better.  Need for a thorough workup to rule out the differential diagnosis and plan treatment was explained.  I explained laparoscopic techniques with possible need for an open approach.  Risks such as bleeding, infection, abscess, leak, need for further treatment, heart attack, death, and other risks were discussed.   I noted a good likelihood this will help address the problem.  Goals of post-operative recovery were discussed as well.  Possibility that this will not correct all symptoms was explained.  Post-operative dysphagia, need for short-term liquid & pureed diet, inability to vomit, possibility of reherniation, possible need for medicines to help control symptoms in addition to surgery were discussed.  We will work to minimize complications.   Educational handouts further explaining the pathology, treatment options, and dysphagia diet was given as well.  Questions were answered.  The patient expresses understanding & wishes to proceed with surgery.  I am concerned about the health of the patient and the ability to tolerate the operation.  Therefore, we will request clearance by cardiology to better assess operative  risk & see if a reevaluation, further workup, adjustment to medications, etc is needed.  Cardiology had wanted to have an outpatient stress test done at discharge anyway.  Patient did not realize this.  We will work to help set this up.

## 2013-04-03 NOTE — Patient Instructions (Addendum)
See the Handout(s) we gave you.  Consider surgery.  Please call our office at [phone] if you wish to schedule surgery or if you have further questions / concerns.   Please start taking supplemental shakes such as Boost or [employer] everyday.  One to 2 shakes a day.  This will keep your nutrition up and give you a better chance to tolerate the surgery with less complications.  Clearance from your cardiologist first.   Outpatient cardiac stress test had been recommended with the last admission.  He contacted their office.  They should call to set it up soon.  Call them if you have not heard anything by early next week  Hiatal Hernia A hiatal hernia occurs when a part of the stomach slides above the diaphragm. The diaphragm is the thin muscle separating the belly (abdomen) from the chest. A hiatal hernia can be something you are born with or develop over time. Hiatal hernias may allow stomach acid to flow back into your esophagus, the tube which carries food from your mouth to your stomach. If this acid causes problems it is called GERD (gastro-esophageal reflux disease).  SYMPTOMS  Common symptoms of GERD are heartburn (burning in your chest). This is worse when lying down or bending over. It may also cause belching and indigestion. Some of the things which make GERD worse are:  Increased weight pushes on stomach making acid rise more easily.  Smoking markedly increases acid production.  Alcohol decreases lower esophageal sphincter pressure (valve between stomach and esophagus), allowing acid from stomach into esophagus.  Late evening meals and going to bed with a full stomach increases pressure.  Anything that causes an increase in acid production.  Lower esophageal sphincter incompetence. DIAGNOSIS  Hiatal hernia is often diagnosed with x-rays of your stomach and small bowel. This is called an UGI (upper gastrointestinal x-ray). Sometimes a gastroscopic procedure is done. This  is a procedure where your caregiver uses a flexible instrument to look into the stomach and small bowel. HOME CARE INSTRUCTIONS   Try to achieve and maintain an ideal body weight.  Avoid drinking alcoholic beverages.  Stop smoking.  Put the head of your bed on 4 to 6 inch blocks. This will keep your head and esophagus higher than your stomach. If you cannot use blocks, sleep with several pillows under your head and shoulders.  Over-the-counter medications will decrease acid production. Your caregiver can also prescribe medications for this. Take as directed.  1/2 to 1 teaspoon of an antacid taken every hour while awake, with meals and at bedtime, will neutralize acid.  Do not take aspirin, ibuprofen (Advil or Motrin), or other nonsteroidal anti-inflammatory drugs.  Do not wear tight clothing around your chest or stomach.  Eat smaller meals and eat more frequently. This keeps your stomach from getting too full. Eat slowly.  Do not lie down for 2 or 3 hours after eating. Do not eat or drink anything 1 to 2 hours before going to bed.  Avoid caffeine beverages (colas, coffee, cocoa, tea), fatty foods, citrus fruits and all other foods and drinks that contain acid and that seem to increase the problems.  Avoid bending over, especially after eating. Also avoid straining during bowel movements or when urinating or lifting things. Anything that increases the pressure in your belly increases the amount of acid that may be pushed up into your esophagus. SEEK IMMEDIATE MEDICAL CARE IF:  There is change in location (pain in arms, neck, jaw, teeth  or back) of your pain, or the pain is getting worse.  You also experience nausea, vomiting, sweating (diaphoresis), or shortness of breath.  You develop continual vomiting, vomit blood or coffee ground material, have bright red blood in your stools, or have black tarry stools. Some of these symptoms could signal other problems such as heart  disease. MAKE SURE YOU:   Understand these instructions.  Monitor your condition.  Contact your caregiver if you are not doing well or are getting worse. Document Released: 07/16/2003 Document Revised: 07/18/2011 Document Reviewed: 04/25/2005 [facility] Patient Information 2014 [place], [place].  EATING AFTER YOUR ESOPHAGEAL SURGERY (Stomach Fundoplication, Hiatal Hernia repair, Achalasia surgery, etc)  After your esophageal surgery, expect some sticking with swallowing over the next 1-2 months.    If food sticks when you eat, it is called "dysphagia".  This is due to swelling around your esophagus at the wrap & hiatal diaphragm repair.  It will gradually ease off over the next few months.  To help you through this temporary phase, we start you out on a pureed (blenderized) diet.  Your first meal in the hospital was thin liquids.  You should have been given a pureed diet by the time you left the hospital.  We ask patients to stay on a pureed diet for the first 2-3 weeks to avoid anything getting "stuck" near your recent surgery.  Don't be alarmed if your ability to swallow doesn't progress according to this plan.  Everyone is different and some diets can advance more or less quickly.     Some BASIC RULES to follow are:  Maintain an upright position whenever eating or drinking.  Take small bites - just a teaspoon size bite at a time.  Eat slowly.  It may also help to eat only one food at a time.  Consider nibbling through smaller, more frequent meals & avoid the urge to eat BIG meals  Do not push through feelings of fullness, nausea, or bloatedness  Do not mix solid foods and liquids in the same mouthful  Try not to "wash foods down" with large gulps of liquids. Avoid carbonated (bubbly/fizzy) drinks.  Understand that it will be hard to burp and belch at first.  This gradually improves with time.  Expect to be more gassy/flatulent/bloated initially.  Walking will help you work through  that.  Maalox/Gas-X can help as well.  Eat in a relaxed atmosphere & minimize distractions.  Avoid talking while eating.    Do not use straws.  Following each meal, sit in an upright position (90 degree angle) for 60 to 90 minutes.  Going for a short walk can help as well  If food does stick, don't panic.  Try to relax and let the food pass on its own.  Sipping WARM LIQUID such as strong hot black tea can also help slide it down.   Be gradual in changes & use common sense:  -If you easily tolerating a certain "level" of foods, advance to the next level gradually -If you are having trouble swallowing a particular food, then avoid it.   -If food is sticking when you advance your diet, go back to thinner previous diet (the lower LEVEL) for 1-2 days.  LEVEL 1 = PUREED DIET  Do for the first 2 WEEKS AFTER SURGERY  -Foods in this group are pureed or blenderized to a smooth, mashed potato-like consistency.  -If necessary, the pureed foods can keep their shape with the addition of a thickening agent.   -  Meat should be pureed to a smooth, pasty consistency.  Hot broth or gravy may be added to the pureed meat, approximately 1 oz. of liquid per 3 oz. serving of meat. -CAUTION:  If any foods do not puree into a smooth consistency, swallowing will be more difficult.  (For example, nuts or seeds sometimes do not blend well.)  Hot Foods Cold Foods  Pureed scrambled eggs and cheese Pureed cottage cheese  Baby cereals Thickened juices and nectars  Thinned cooked cereals (no lumps) Thickened milk or eggnog  Pureed [place] toast or pancakes Ensure  Mashed potatoes Ice cream  Pureed parsley, au gratin, scalloped potatoes, candied sweet potatoes Fruit or [place] ice, sherbet  Pureed buttered or alfredo noodles Plain yogurt  Pureed vegetables (no corn or peas) Instant breakfast  Pureed soups and creamed soups Smooth pudding, mousse, custard  Pureed scalloped apples Whipped gelatin  Gravies Sugar,  syrup, honey, jelly  Sauces, cheese, tomato, barbecue, white, creamed Cream  Any baby food Creamer  Alcohol in moderation (not beer or champagne) Margarine  Coffee or tea Mayonnaise   Ketchup, mustard   Apple sauce   SAMPLE MENU:  PUREED DIET Breakfast Lunch Dinner   Orange juice, 1/2 cup  Cream of wheat, 1/2 cup  Pineapple juice, 1/2 cup  Pureed [place], barley soup, 3/4 cup  Pureed Hawaiian chicken, 3 oz   Scrambled eggs, mashed or blended with cheese, 1/2 cup  Tea or coffee, 1 cup   Whole milk, 1 cup   Non-dairy creamer, 2 Tbsp.  Mashed potatoes, 1/2 cup  Pureed cooled broccoli, 1/2 cup  Apple sauce, 1/2 cup  Coffee or tea  Mashed potatoes, 1/2 cup  Pureed spinach, 1/2 cup  Frozen yogurt, 1/2 cup  Tea or coffee      LEVEL 2 = SOFT DIET  After your first 2 weeks, you can advance to a soft diet.   Keep on this diet until everything goes down easily.  Hot Foods Cold Foods  White fish Cottage cheese  Stuffed fish Junior baby fruit  Baby food meals Semi thickened juices  Minced soft cooked, scrambled, poached eggs nectars  Souffle & omelets Ripe mashed bananas  Cooked cereals Canned fruit, pineapple sauce, milk  potatoes Milkshake  Buttered or Alfredo noodles Custard  Cooked cooled vegetable Puddings, including tapioca  Sherbet Yogurt  Vegetable soup or alphabet soup Fruit ice, [place] ice  Gravies Whipped gelatin  Sugar, syrup, honey, jelly Junior baby desserts  Sauces:  Cheese, creamed, barbecue, tomato, white Cream  Coffee or tea Margarine   SAMPLE MENU:  LEVEL 2 Breakfast Lunch Dinner   Orange juice, 1/2 cup  Oatmeal, 1/2 cup  Scrambled eggs with cheese, 1/2 cup  Decaffeinated tea, 1 cup  Whole milk, 1 cup  Non-dairy creamer, 2 Tbsp  Pineapple juice, 1/2 cup  Minced beef, 3 oz  Gravy, 2 Tbsp  Mashed potatoes, 1/2 cup  Minced fresh broccoli, 1/2 cup  Applesauce, 1/2 cup  Coffee, 1 cup  [place], barley soup, 3/4 cup  Minced  Hawaiian chicken, 3 oz  Mashed potatoes, 1/2 cup  Cooked spinach, 1/2 cup  Frozen yogurt, 1/2 cup  Non-dairy creamer, 2 Tbsp      LEVEL 3 = CHOPPED DIET  -After all the foods in level 2 (soft diet) are passing through well you should advance up to more chopped foods.  -It is still important to cut these foods into small pieces and eat slowly.  Hot Foods Cold Foods  [place] Southern  Chopped Swedish meatballs Yogurt  Meat salads (ground or flaked meat) Milk  Flaked fish (tuna) Milkshakes  Poached or scrambled eggs Soft, cold, dry cereal  Souffles and omelets Fruit juices or nectars  Cooked cereals Chopped canned fruit  Chopped [place] toast or pancakes Canned fruit cocktail  Noodles or pasta (no rice) Pudding, mousse, custard  Cooked vegetables (no frozen peas, corn, or mixed vegetables) Green salad  Canned small sweet peas Ice cream  Creamed soup or vegetable soup Fruit ice, [place] ice  Pureed vegetable soup or alphabet soup Non-dairy creamer  Ground scalloped apples Margarine  Gravies Mayonnaise  Sauces:  Cheese, creamed, barbecue, tomato, white Ketchup  Coffee or tea Mustard   SAMPLE MENU:  LEVEL 3 Breakfast Lunch Dinner   Orange juice, 1/2 cup  Oatmeal, 1/2 cup  Scrambled eggs with cheese, 1/2 cup  Decaffeinated tea, 1 cup  Whole milk, 1 cup  Non-dairy creamer, 2 Tbsp  Ketchup, 1 Tbsp  Margarine, 1 tsp  Salt, 1/4 tsp  Sugar, 2 tsp  Pineapple juice, 1/2 cup  Ground beef, 3 oz  Gravy, 2 Tbsp  Mashed potatoes, 1/2 cup  Cooked spinach, 1/2 cup  Applesauce, 1/2 cup  Decaffeinated coffee  Whole milk  Non-dairy creamer, 2 Tbsp  Margarine, 1 tsp  Salt, 1/4 tsp  Pureed [place], barley soup, 3/4 cup  Barbecue chicken, 3 oz  Mashed potatoes, 1/2 cup  Ground fresh broccoli, 1/2 cup  Frozen yogurt, 1/2 cup  Decaffeinated tea, 1 cup  Non-dairy creamer, 2 Tbsp  Margarine, 1 tsp  Salt, 1/4 tsp  Sugar, 1 tsp    LEVEL 4:   REGULAR FOODS  -Foods in this group are soft, moist, regularly textured foods.   -This level includes meat and breads, which tend to be the hardest things to swallow.   -Eat very slowly, chew well and continue to avoid carbonated drinks. -most people are at this level in 4-6 weeks  Hot Foods Cold Foods  Baked fish or skinned Soft cheeses - cottage cheese  Souffles and omelets Cream cheese  Eggs Yogurt  Stuffed shells Milk  Spaghetti with meat sauce Milkshakes  Cooked cereal Cold dry cereals (no nuts, dried fruit, coconut)  [place] toast or pancakes Crackers  Buttered toast Fruit juices or nectars  Noodles or pasta (no rice) Canned fruit  Potatoes (all types) Ripe bananas  Soft, cooked vegetables (no corn, lima, or baked beans) Peeled, ripe, fresh fruit  Creamed soups or vegetable soup Cakes (no nuts, dried fruit, coconut)  Canned chicken noodle soup Plain doughnuts  Gravies Ice cream  Bacon dressing Pudding, mousse, custard  Sauces:  Cheese, creamed, barbecue, tomato, white Fruit ice, [place] ice, sherbet  Decaffeinated tea or coffee Whipped gelatin  Pork chops Regular gelatin   Canned fruited gelatin molds   Sugar, syrup, honey, jam, jelly   Cream   Non-dairy   Margarine   Oil   Mayonnaise   Ketchup   Mustard    If you have any questions please call our office at [facility]: [phone].  GETTING TO GOOD BOWEL HEALTH. Irregular bowel habits such as constipation and diarrhea can lead to many problems over time.  Having one soft bowel movement a day is the most important way to prevent further problems.  The anorectal canal is designed to handle stretching and feces to safely manage our ability to get rid of solid waste (feces, poop, stool) out of our body.  BUT, hard constipated stools can act like ripping concrete  bricks and diarrhea can be a burning fire to this very sensitive area of our body, causing inflamed hemorrhoids, anal fissures, increasing risk is  perirectal abscesses, abdominal pain/bloating, an making irritable bowel worse.     The goal: ONE SOFT BOWEL MOVEMENT A DAY!  To have soft, regular bowel movements:    Drink at least 8 tall glasses of water a day.     Take plenty of fiber.  Fiber is the undigested part of plant food that passes into the colon, acting s "natures broom" to encourage bowel motility and movement.  Fiber can absorb and hold large amounts of water. This results in a larger, bulkier stool, which is soft and easier to pass. Work gradually over several weeks up to 6 servings a day of fiber (25g a day even more if needed) in the form of: o Vegetables -- Root (potatoes, carrots, turnips), leafy green (lettuce, salad greens, celery, spinach), or cooked high residue (cabbage, broccoli, etc) o Fruit -- Fresh (unpeeled skin & pulp), Dried (prunes, apricots, cherries, etc ),  or stewed ( applesauce)  o Whole grain breads, pasta, etc (whole wheat)  o Bran cereals    Bulking Agents -- This type of water-retaining fiber generally is easily obtained each day by one of the following:  o Psyllium bran -- The psyllium plant is remarkable because its ground seeds can retain so much water. This product is available as Metamucil, Konsyl, Effersyllium, Per Diem Fiber, or the less expensive generic preparation in drug and health food stores. Although labeled a laxative, it really is not a laxative.  o Methylcellulose -- This is another fiber derived from wood which also retains water. It is available as Citrucel. o Polyethylene Glycol - and "artificial" fiber commonly called Miralax or Glycolax.  It is helpful for people with gassy or bloated feelings with regular fiber o Flax Seed - a less gassy fiber than psyllium   No reading or other relaxing activity while on the toilet. If bowel movements take longer than 5 minutes, you are too constipated   AVOID CONSTIPATION.  High fiber and water intake usually takes care of this.  Sometimes a laxative  is needed to stimulate more frequent bowel movements, but    Laxatives are not a good long-term solution as it can wear the colon out. o Osmotics (Milk of Magnesia, Fleets phosphosoda, Magnesium citrate, MiraLax, GoLytely) are safer than  o Stimulants (Senokot, Castor Oil, Dulcolax, Ex Lax)    o Do not take laxatives for more than 7days in a row.    IF SEVERELY CONSTIPATED, try a Bowel Retraining Program: o Do not use laxatives.  o Eat a diet high in roughage, such as bran cereals and leafy vegetables.  o Drink six (6) ounces of prune or apricot juice each morning.  o Eat two (2) large servings of stewed fruit each day.  o Take one (1) heaping tablespoon of a psyllium-based bulking agent twice a day. Use sugar-free sweetener when possible to avoid excessive calories.  o Eat a normal breakfast.  o Set aside 15 minutes after breakfast to sit on the toilet, but do not strain to have a bowel movement.  o If you do not have a bowel movement by the third day, use an enema and repeat the above steps.    Controlling diarrhea o Switch to liquids and simpler foods for a few days to avoid stressing your intestines further. o Avoid dairy products (especially milk & ice cream) for a  short time.  The intestines often can lose the ability to digest lactose when stressed. o Avoid foods that cause gassiness or bloating.  Typical foods include beans and other legumes, cabbage, broccoli, and dairy foods.  Every person has some sensitivity to other foods, so listen to our body and avoid those foods that trigger problems for you. o Adding fiber (Citrucel, Metamucil, psyllium, Miralax) gradually can help thicken stools by absorbing excess fluid and retrain the intestines to act more normally.  Slowly increase the dose over a few weeks.  Too much fiber too soon can backfire and cause cramping & bloating. o Probiotics (such as active yogurt, Align, etc) may help repopulate the intestines and colon with normal bacteria  and calm down a sensitive digestive tract.  Most studies show it to be of mild help, though, and such products can be costly. o Medicines:   Bismuth subsalicylate (ex. Kayopectate, Pepto Bismol) every 30 minutes for up to 6 doses can help control diarrhea.  Avoid if pregnant.   Loperamide (Immodium) can slow down diarrhea.  Start with two tablets (4mg  total) first and then try one tablet every 6 hours.  Avoid if you are having fevers or severe pain.  If you are not better or start feeling worse, stop all medicines and call your doctor for advice o Call your doctor if you are getting worse or not better.  Sometimes further testing (cultures, endoscopy, X-ray studies, bloodwork, etc) may be needed to help diagnose and treat the cause of the diarrhea.  Managing Pain  Pain after surgery or related to activity is often due to strain/injury to muscle, tendon, nerves and/or incisions.  This pain is usually short-term and will improve in a few months.   Many people find it helpful to do the following things TOGETHER to help speed the process of healing and to get back to regular activity more quickly:  1. Avoid heavy physical activity a.  no lifting greater than 20 pounds b. Do not "push through" the pain.  Listen to your body and avoid positions and maneuvers than reproduce the pain c. Walking is okay as tolerated, but go slowly and stop when getting sore.  d. Remember: If it hurts to do it, then don't do it! 2. Take Anti-inflammatory medication  a. Take with food/snack around the clock for 1-2 weeks i. This helps the muscle and nerve tissues become less irritable and calm down faster b. Choose ONE of the following over-the-counter medications: i. Naproxen 220mg  tabs (ex. Aleve) 1-2 pills twice a day  ii. Ibuprofen 200mg  tabs (ex. Advil, Motrin) 3-4 pills with every meal and just before bedtime iii. Acetaminophen 500mg  tabs (Tylenol) 1-2 pills with every meal and just before bedtime 3. Use a Heating  pad or Ice/Cold Pack a. 4-6 times a day b. May use warm bath/hottub  or showers 4. Try Gentle Massage and/or Stretching  a. at the area of pain many times a day b. stop if you feel pain - do not overdo it  Try these steps together to help you body heal faster and avoid making things get worse.  Doing just one of these things may not be enough.    If you are not getting better after two weeks or are noticing you are getting worse, contact our office for further advice; we may need to re-evaluate you & see what other things we can do to help.  Exercise to Stay Healthy Exercise helps you become and stay healthy. EXERCISE  IDEAS AND TIPS Choose exercises that:  You enjoy.  Fit into your day. You do not need to exercise really hard to be healthy. You can do exercises at a slow or medium level and stay healthy. You can:  Stretch before and after working out.  Try yoga, Pilates, or tai chi.  Lift weights.  Walk fast, swim, jog, run, climb stairs, bicycle, dance, or rollerskate.  Take aerobic classes. Exercises that burn about 150 calories:  Running 1  miles in 15 minutes.  Playing volleyball for 45 to 60 minutes.  Washing and waxing a car for 45 to 60 minutes.  Playing touch football for 45 minutes.  Walking 1  miles in 35 minutes.  Pushing a stroller 1  miles in 30 minutes.  Playing basketball for 30 minutes.  Raking leaves for 30 minutes.  Bicycling 5 miles in 30 minutes.  Walking 2 miles in 30 minutes.  Dancing for 30 minutes.  Shoveling snow for 15 minutes.  Swimming laps for 20 minutes.  Walking up stairs for 15 minutes.  Bicycling 4 miles in 15 minutes.  Gardening for 30 to 45 minutes.  Jumping rope for 15 minutes.  Washing windows or floors for 45 to 60 minutes. Document Released: 05/28/2010 Document Revised: 07/18/2011 Document Reviewed: 05/28/2010 [facility] Patient Information 2014 [place], [place].

## 2013-04-09 ENCOUNTER — Encounter: Payer: Self-pay | Admitting: Physician Assistant

## 2013-04-09 ENCOUNTER — Ambulatory Visit (INDEPENDENT_AMBULATORY_CARE_PROVIDER_SITE_OTHER): Payer: Medicare Other | Admitting: Physician Assistant

## 2013-04-09 ENCOUNTER — Encounter: Payer: Self-pay | Admitting: Cardiovascular Disease

## 2013-04-09 ENCOUNTER — Ambulatory Visit (HOSPITAL_COMMUNITY): Payer: Medicare Other | Attending: Cardiology | Admitting: Radiology

## 2013-04-09 VITALS — BP 122/62 | Ht 66.0 in | Wt 152.0 lb

## 2013-04-09 VITALS — BP 140/70 | HR 88 | Ht 66.0 in | Wt 151.8 lb

## 2013-04-09 DIAGNOSIS — R112 Nausea with vomiting, unspecified: Secondary | ICD-10-CM | POA: Insufficient documentation

## 2013-04-09 DIAGNOSIS — Z01810 Encounter for preprocedural cardiovascular examination: Secondary | ICD-10-CM

## 2013-04-09 DIAGNOSIS — Z9861 Coronary angioplasty status: Secondary | ICD-10-CM | POA: Insufficient documentation

## 2013-04-09 DIAGNOSIS — I2510 Atherosclerotic heart disease of native coronary artery without angina pectoris: Secondary | ICD-10-CM

## 2013-04-09 DIAGNOSIS — I10 Essential (primary) hypertension: Secondary | ICD-10-CM

## 2013-04-09 DIAGNOSIS — R0989 Other specified symptoms and signs involving the circulatory and respiratory systems: Secondary | ICD-10-CM | POA: Insufficient documentation

## 2013-04-09 DIAGNOSIS — R079 Chest pain, unspecified: Secondary | ICD-10-CM

## 2013-04-09 DIAGNOSIS — R002 Palpitations: Secondary | ICD-10-CM | POA: Insufficient documentation

## 2013-04-09 DIAGNOSIS — E785 Hyperlipidemia, unspecified: Secondary | ICD-10-CM | POA: Insufficient documentation

## 2013-04-09 DIAGNOSIS — Z8249 Family history of ischemic heart disease and other diseases of the circulatory system: Secondary | ICD-10-CM | POA: Insufficient documentation

## 2013-04-09 DIAGNOSIS — R0609 Other forms of dyspnea: Secondary | ICD-10-CM | POA: Insufficient documentation

## 2013-04-09 MED ORDER — TECHNETIUM TC 99M SESTAMIBI GENERIC - CARDIOLITE
10.0000 | Freq: Once | INTRAVENOUS | Status: AC | PRN
  Administered 2013-04-09: 10 via INTRAVENOUS

## 2013-04-09 MED ORDER — AMINOPHYLLINE 25 MG/ML IV SOLN
75.0000 mg | Freq: Once | INTRAVENOUS | Status: AC
  Administered 2013-04-09: 75 mg via INTRAVENOUS

## 2013-04-09 MED ORDER — TECHNETIUM TC 99M SESTAMIBI GENERIC - CARDIOLITE
30.0000 | Freq: Once | INTRAVENOUS | Status: AC | PRN
  Administered 2013-04-09: 30 via INTRAVENOUS

## 2013-04-09 MED ORDER — REGADENOSON 0.4 MG/5ML IV SOLN
0.4000 mg | Freq: Once | INTRAVENOUS | Status: AC
  Administered 2013-04-09: 0.4 mg via INTRAVENOUS

## 2013-04-09 NOTE — Progress Notes (Signed)
[address] [place], [place]  [address] Phone: [phone] Fax:  [phone]  Date:  04/09/2013   ID:  [name] [name], DOB 1935/07/03, MRN [id number]  PCP:  [name], MD  Cardiologist:  Dr. Marca Ancona     History of Present Illness: [name] [name] is a 77 y.o. female with a h/o CAD s/p PTCA to D1 in 2003, HTN, HL, GERD s/p Nissen Fundoplication 05/2011, MGUS, recurrent aspiration bronchitis.  Myoview (03/2011): Anterior and anterior apical infarct, no ischemia, EF 68%.  She was recently admitted with nausea, vomiting, abdominal soreness and chest pain. Cardiac markers remained normal. Chest CTA was neg for pulmonary embolism.  Echocardiogram (03/12/13): EF 55-60%, normal wall motion, PASP 41. She was evaluated by cardiology and outpatient stress testing was recommended. EGD demonstrated incompetent Nissen fundoplication with recurrent large hiatal hernia and esophageal candidiasis. She has been evaluated by surgery with plans for repair of her fundoplication.  She needs surgical clearance.    Since d/c, she has been doing ok.  She is eager to have her surgery.  She denies chest pain.  She has chronic DOE and chronic cough.  She denies syncope.  She sleeps on 2 pillows.  No PND.  No edema.    Recent Labs: 03/12/2013: ALT 12; Creatinine 0.80; Hemoglobin 13.4; Potassium 3.9   Wt Readings from Last 3 Encounters:  04/03/13 152 lb 6.4 oz (69.128 kg)  03/12/13 153 lb 12.8 oz (69.763 kg)  03/12/13 153 lb 12.8 oz (69.763 kg)     Past Medical History:  1. CAD: LHC 2003 with 40% pLAD, 30% mLAD, 100% D1 (moderate vessel), 100% D2 (small vessel), 95% mid small OM2. Pt had PTCA to D1. Last myoview (5/09): EF 66%, anteroseptal/apical infarct with minimal peri-infarct ischemia. No significant change from 11/06. Pt managed medically. Echo 8/12: EF 55-65%, grade 1 diast dysfxn, mild LAE, increased septal thickness c/w lipomatous hypertrophy, PASP 35.  2. Recurrent bronchitis, suspect  asthma: PFTs 12/09 FVC 97%, FEV1 101%, ratio 77%, TLC 109%. No significant obstruction or restriction. Never smoked.  3. HYPERTENSION (ICD-401.9)  4. HYPERLIPIDEMIA (ICD-272.4)  5. ESOPHAGEAL STRICTURE (ICD-530.3)  6. DIVERTICULOSIS, COLON (ICD-562.10)  7. HIATAL HERNIA WITH REFLUX (ICD-553.3)  8. IRON DEFICIENCY ANEMIA, HX OF (ICD-V12.3): EGD, colonoscopy, and capsule endoscopy in fall 2010 unrevealing for source. She then had mesenteric angiogram in 1/11 with no bleeding source found. ? small bowel AVMs.    Past Medical History  Diagnosis Date  . CAD (coronary artery disease)     LHC 2003 with 40% pLAD, 30% mLAD, 100% D1 (moderate vessel), 100%  D2 (small vessel), 95% mid small OM2.  Pt had PTCA to D1;  D2 and OM2 small vessels and tx medically;  Last Myoview (2012): anterior infarct seen with scar, no ischemia. EF normal. Pt managed medically  . Recurrent aspiration bronchitis/pneumonia     PFTs 12/09 FVC 97%, FEV1 101%, ratio 77%, TLC 109%. No significant obstruction or restriction.   . Hypertension   . Hyperlipidemia   . Esophageal stricture   . Diverticulosis of colon   . Gastroesophageal reflux disease with hiatal hernia   . Iron deficiency anemia   . Gastritis   . GERD (gastroesophageal reflux disease)   . Hiatal hernia   . Asthma   . Blood transfusion   . Cough   . Wheezing   . Weakness   . Trouble swallowing   . Abdominal pain   . Hiatal hernia   . Shortness of  breath   . Anxiety     Current Outpatient Prescriptions  Medication Sig Dispense Refill  . Aclidinium Bromide (TUDORZA PRESSAIR) 400 MCG/ACT AEPB Inhale 1 puff into the lungs 2 (two) times daily.      [name] ALPRAZolam (XANAX) 0.5 MG tablet Take 0.5 mg by mouth 3 (three) times daily as needed for sleep or anxiety.       [name] amLODipine (NORVASC) 10 MG tablet Take 10 mg by mouth every morning.       [name] aspirin EC 81 MG tablet Take 1 tablet (81 mg total) by mouth daily.  30 tablet  0  . calcitonin, salmon,  (MIACALCIN/FORTICAL) 200 UNIT/ACT nasal spray Place 1 spray into the nose every morning. She alternates nostrils every other day.      . Cholecalciferol (VITAMIN D3) 2000 UNITS TABS Take 2,000-4,000 Units by mouth 2 (two) times daily. She takes two tablets in the morning and one tablet at bedtime.      . Cyanocobalamin (VITAMIN B 12 PO) Take 1 tablet by mouth every morning.       . docusate sodium 100 MG CAPS Take 100 mg by mouth 2 (two) times daily.  10 capsule  0  . feeding supplement, RESOURCE BREEZE, (RESOURCE BREEZE) LIQD Take 1 Container by mouth daily at 3 pm.  1 Container  0  . gabapentin (NEURONTIN) 300 MG capsule Take 300 mg by mouth at bedtime as needed (For pain.).       [name] hydrochlorothiazide 25 MG tablet Take 25 mg by mouth daily with breakfast.       . HYDROcodone-acetaminophen (NORCO) 7.5-325 MG per tablet Take 1 tablet by mouth every 3 (three) hours as needed.  30 tablet  0  . losartan (COZAAR) 100 MG tablet Take 100 mg by mouth every morning.       . metoCLOPramide (REGLAN) 10 MG tablet Take 1 tablet (10 mg total) by mouth every 6 (six) hours as needed (Nausea).  20 tablet  0  . metoprolol tartrate (LOPRESSOR) 25 MG tablet Take 12.5 mg by mouth at bedtime.      . mometasone-formoterol (DULERA) 200-5 MCG/ACT AERO Inhale 2 puffs into the lungs 2 (two) times daily.      . Multiple Vitamin (MULTIVITAMIN WITH MINERALS) TABS Take 1 tablet by mouth every morning.       [name] OVER THE COUNTER MEDICATION Take 1,000 mg by mouth every other day. Coral Calcium 1000mg       . OVER THE COUNTER MEDICATION Take 400 mg by mouth 3 (three) times daily. Magnesium Carbonate 400mg       . pantoprazole (PROTONIX) 40 MG tablet Take 40 mg by mouth 2 (two) times daily.      . pravastatin (PRAVACHOL) 40 MG tablet Take 40 mg by mouth at bedtime.       . promethazine (PHENERGAN) 12.5 MG tablet Take 1 tablet (12.5 mg total) by mouth every 6 (six) hours as needed for nausea.  12 tablet  0  . senna (SENOKOT) 8.6 MG  TABS tablet Take 1 tablet (8.6 mg total) by mouth daily.  120 each  0   No current facility-administered medications for this visit.    Allergies:   Aspartame; Daliresp; and Ace inhibitors   Social History:  The patient  reports that she has never smoked. She has never used smokeless tobacco. She reports that she does not drink alcohol or use illicit drugs.   Family History:  The patient's family history includes Cancer in  her mother; Colon cancer (age of onset: 64) in her mother; Heart disease (age of onset: 45) in her father.   ROS:  Please see the history of present illness.   No further vomiting.  No melena.    All other systems reviewed and negative.   PHYSICAL EXAM: VS:  BP 140/70  Pulse 88  Ht 5\' 6"  (1.676 m)  Wt 151 lb 12.8 oz (68.856 kg)  BMI 24.51 kg/m2 Well nourished, well developed, in no acute distress HEENT: normal Neck: no JVD Cardiac:  normal S1, S2; RRR; no murmur Lungs:  clear to auscultation bilaterally, no wheezing, rhonchi or rales Abd: soft, nontender, no hepatomegaly Ext: no edema Skin: warm and dry Neuro:  CNs 2-12 intact, no focal abnormalities noted  EKG:  NSR, HR 88, nonspecific ST-T wave changes, no change from prior tracings     ASSESSMENT AND PLAN:  1. Surgical Clearance:  Patient was evaluated by Dr. Marca Ancona in the hospital who recommended proceeding with stress testing.  She requires a [employer].  We will try to arrange this as soon as possible so as not to delay her surgery.  If the stress test is low risk, she will not require further testing.  We will forward results and final recommendations to her surgeon once her stress test is complete.  2. CAD:  Continue ASA, statin, beta blocker. Proceed with myoview as noted.   3. Hypertension:  Controlled.  4. Hyperlipidemia:  Continue statin. 5. Hiatal Hernia:  Continue f/u with GI and gen surgery.  Surgery is pending.  6. Disposition:  Plan follow up with Dr. Marca Ancona in 6 mos or  sooner if myoview is abnormal.    Signed, Tereso Newcomer, PA-C  04/09/2013 8:17 AM

## 2013-04-09 NOTE — Patient Instructions (Addendum)
Your physician assistant recommends that you schedule a follow-up appointment in: 6 months with Dr. Marca Ancona     [facility] TODAY

## 2013-04-09 NOTE — Progress Notes (Signed)
[facility] SITE 3 NUCLEAR MED [address] [place], [place] [address] [phone]    Cardiology Nuclear Med Study  [name] [name] is a 77 y.o. female     MRN : [id number]     DOB: [birth year]  Procedure Date: 04/09/2013  Nuclear Med Background Indication for Stress Test:  Surgical Clearance and Post Hospital for CP,vomitting,and nausea History:  CAD; PTCA '03; Echo'14 EF:55-60%; MPI '12 EF:68% Cardiac Risk Factors: Family History - CAD, Hypertension and Lipids  Symptoms:  Chest Pain, DOE, Nausea and Palpitations   Nuclear Pre-Procedure Caffeine/Decaff Intake:  None >12 hours NPO After: 7:00pm   Lungs:  clear O2 Sat: 97% on room air. IV 0.9% NS with Angio Cath:  22g  IV Site: L Forearm  IV Started by:  [name] [name], EMT-P  Chest Size (in):  40 Cup Size: B  Height: 5\' 6"  (1.676 m)  Weight:  152 lb (68.947 kg)  BMI:  Body mass index is 24.55 kg/(m^2). Tech Comments:  This patient was given Aminophylline for symptoms 75 mg IV.    Nuclear Med Study 1 or 2 day study: 1 day  Stress Test Type:  Eugenie Birks  Reading MD: [name] Millers, MD  Order Authorizing Provider:  Marca Ancona, MD  Resting Radionuclide: Technetium 45m Sestamibi  Resting Radionuclide Dose: 11.0 mCi   Stress Radionuclide:  Technetium 64m Sestamibi  Stress Radionuclide Dose: 33.0 mCi           Stress Protocol Rest HR: 81 Stress HR: 109  Rest BP: 122/62 Stress BP: 139/55  Exercise Time (min): n/a METS: n/a   Predicted Max HR: 145 bpm % Max HR: 75.17 bpm Rate Pressure Product: 52841   Dose of Adenosine (mg):  n/a Dose of Lexiscan: 0.4 mg  Dose of Atropine (mg): n/a Dose of Dobutamine: n/a mcg/kg/min (at max HR)  Stress Test Technologist: [name] [name], EMT-P  Nuclear Technologist:  [name] [name], CNMT     Rest Procedure:  Myocardial perfusion imaging was performed at rest 45 minutes following the intravenous administration of Technetium 14m Sestamibi. Rest ECG: Sinus rhythm, septal MI,  nonspecific ST changes.  Stress Procedure:  The patient received IV Lexiscan 0.4 mg over 15-seconds.  Technetium 76m Sestamibi injected at 30-seconds. This patient had abdominal cramping and was lt. Headed with the Lexiscan injection. Quantitative spect images were obtained after a 45 minute delay. Stress ECG: No significant ST segment change suggestive of ischemia.  QPS Raw Data Images:  Acquisition technically good; normal left ventricular size. Stress Images:  There is decreased uptake in the anterior wall. Rest Images:  There is decreased uptake in the anterior wall, less prominent compared to the stress images. Subtraction (SDS):  These findings are consistent with prior infarct and mild peri-infarct ischemia. Transient Ischemic Dilatation (Normal <1.22):  1.07 Lung/Heart Ratio (Normal <0.45):  0.32  Quantitative Gated Spect Images QGS EDV:  63 ml QGS ESV:  29 ml  Impression Exercise Capacity:  Lexiscan with no exercise. BP Response:  Normal blood pressure response. Clinical Symptoms:  No chest pain or dyspnea. ECG Impression:  No significant ST segment change suggestive of ischemia. Comparison with Prior Nuclear Study: No images to compare  Overall Impression:  Low risk stress nuclear study with a moderate size, severe intensity, partially reversible anterior defect consistent with prior infarct and very mild peri-infarct ischemia; note LV function appears to be worse than calculated EF; suggest echo to better assess.  LV Ejection Fraction: 55%.  LV Wall Motion:  Anterior akinesis.  [name] [name]

## 2013-04-10 ENCOUNTER — Encounter: Payer: Self-pay | Admitting: Physician Assistant

## 2013-04-10 NOTE — Progress Notes (Signed)
Yes ok.  Echo showed normal EF.

## 2013-04-11 ENCOUNTER — Telehealth: Payer: Self-pay | Admitting: *Deleted

## 2013-04-11 ENCOUNTER — Telehealth (INDEPENDENT_AMBULATORY_CARE_PROVIDER_SITE_OTHER): Payer: Self-pay | Admitting: Surgery

## 2013-04-11 NOTE — Telephone Encounter (Signed)
I have spoken to pt & faxed nuc med study & last office to Dr. Michaell Cowing for pt surgical clearance Mylo Red RN

## 2013-04-11 NOTE — Telephone Encounter (Signed)
Called in requesting results of stress test and if surgery is scheduled  # [phone]

## 2013-04-11 NOTE — Telephone Encounter (Signed)
Message copied by Barrie Folk on Thu Apr 11, 2013  1:24 PM ------      Message from: [place], [place] T      Created: Wed Apr 10, 2013  4:26 PM       Low risk myoview.      Reviewed with Dr. Marca Ancona      [facility] to proceed with surgery at acceptable risk.      Please notify Payeton Germani and fax to her surgeon with my office note.      Tereso Newcomer, PA-C        04/10/2013 4:26 PM ------

## 2013-04-12 NOTE — Telephone Encounter (Signed)
I see cardiac clearance in the Epic system by Drs. Crenshaw & Ronelle Nigh.

## 2013-04-12 NOTE — Telephone Encounter (Signed)
Called pt to notify her that we did receive clearance and I am turning her surgery orders into surgery scheduling today. The pt understands.

## 2013-04-14 ENCOUNTER — Emergency Department (HOSPITAL_COMMUNITY)
Admission: EM | Admit: 2013-04-14 | Discharge: 2013-04-15 | Disposition: A | Discharge status: Home / Self Care | Payer: Medicare Other | Attending: Emergency Medicine | Admitting: Emergency Medicine

## 2013-04-14 ENCOUNTER — Emergency Department (HOSPITAL_COMMUNITY): Payer: Medicare Other

## 2013-04-14 ENCOUNTER — Encounter (HOSPITAL_COMMUNITY): Payer: Self-pay | Admitting: Emergency Medicine

## 2013-04-14 DIAGNOSIS — I10 Essential (primary) hypertension: Secondary | ICD-10-CM | POA: Insufficient documentation

## 2013-04-14 DIAGNOSIS — R0602 Shortness of breath: Secondary | ICD-10-CM | POA: Insufficient documentation

## 2013-04-14 DIAGNOSIS — I2510 Atherosclerotic heart disease of native coronary artery without angina pectoris: Secondary | ICD-10-CM | POA: Insufficient documentation

## 2013-04-14 DIAGNOSIS — E785 Hyperlipidemia, unspecified: Secondary | ICD-10-CM | POA: Insufficient documentation

## 2013-04-14 DIAGNOSIS — K449 Diaphragmatic hernia without obstruction or gangrene: Secondary | ICD-10-CM | POA: Insufficient documentation

## 2013-04-14 DIAGNOSIS — K219 Gastro-esophageal reflux disease without esophagitis: Secondary | ICD-10-CM

## 2013-04-14 DIAGNOSIS — R141 Gas pain: Secondary | ICD-10-CM | POA: Insufficient documentation

## 2013-04-14 DIAGNOSIS — F411 Generalized anxiety disorder: Secondary | ICD-10-CM | POA: Insufficient documentation

## 2013-04-14 DIAGNOSIS — Z888 Allergy status to other drugs, medicaments and biological substances status: Secondary | ICD-10-CM | POA: Insufficient documentation

## 2013-04-14 DIAGNOSIS — Z8719 Personal history of other diseases of the digestive system: Secondary | ICD-10-CM | POA: Insufficient documentation

## 2013-04-14 DIAGNOSIS — R1013 Epigastric pain: Secondary | ICD-10-CM | POA: Insufficient documentation

## 2013-04-14 DIAGNOSIS — Z79899 Other long term (current) drug therapy: Secondary | ICD-10-CM | POA: Insufficient documentation

## 2013-04-14 DIAGNOSIS — E876 Hypokalemia: Secondary | ICD-10-CM | POA: Insufficient documentation

## 2013-04-14 DIAGNOSIS — J45909 Unspecified asthma, uncomplicated: Secondary | ICD-10-CM | POA: Insufficient documentation

## 2013-04-14 DIAGNOSIS — R110 Nausea: Secondary | ICD-10-CM | POA: Insufficient documentation

## 2013-04-14 DIAGNOSIS — Z8701 Personal history of pneumonia (recurrent): Secondary | ICD-10-CM | POA: Insufficient documentation

## 2013-04-14 DIAGNOSIS — Z8709 Personal history of other diseases of the respiratory system: Secondary | ICD-10-CM | POA: Insufficient documentation

## 2013-04-14 DIAGNOSIS — R142 Eructation: Secondary | ICD-10-CM | POA: Insufficient documentation

## 2013-04-14 DIAGNOSIS — Z5189 Encounter for other specified aftercare: Secondary | ICD-10-CM | POA: Insufficient documentation

## 2013-04-14 LAB — POCT I-STAT TROPONIN I

## 2013-04-14 LAB — COMPREHENSIVE METABOLIC PANEL
ALT: 15 U/L (ref 0–35)
Albumin: 4.8 g/dL (ref 3.5–5.2)
Alkaline Phosphatase: 75 U/L (ref 39–117)
Calcium: 10.6 mg/dL — ABNORMAL HIGH (ref 8.4–10.5)
Creatinine, Ser: 1.22 mg/dL — ABNORMAL HIGH (ref 0.50–1.10)
GFR calc Af Amer: 49 mL/min — ABNORMAL LOW (ref >90)
Glucose, Bld: 114 mg/dL — ABNORMAL HIGH (ref 70–99)
Potassium: 3 mEq/L — ABNORMAL LOW (ref 3.5–5.1)
Sodium: 139 mEq/L (ref 135–145)
Total Protein: 8.5 g/dL — ABNORMAL HIGH (ref 6.0–8.3)

## 2013-04-14 LAB — CBC
HCT: 40.3 % (ref 36.0–46.0)
Hemoglobin: 14.3 g/dL (ref 12.0–15.0)
MCHC: 35.5 g/dL (ref 30.0–36.0)
MCV: 86.1 fL (ref 78.0–100.0)
RBC: 4.68 MIL/uL (ref 3.87–5.11)
RDW: 16.1 % — ABNORMAL HIGH (ref 11.5–15.5)
WBC: 10.1 10*3/uL (ref 4.0–10.5)

## 2013-04-14 MED ORDER — POTASSIUM CHLORIDE 10 MEQ/100ML IV SOLN
10.0000 meq | Freq: Once | INTRAVENOUS | Status: AC
  Administered 2013-04-14: 10 meq via INTRAVENOUS
  Filled 2013-04-14: qty 100

## 2013-04-14 MED ORDER — FAMOTIDINE IN NACL 20-0.9 MG/50ML-% IV SOLN
20.0000 mg | Freq: Once | INTRAVENOUS | Status: AC
  Administered 2013-04-14: 20 mg via INTRAVENOUS
  Filled 2013-04-14: qty 50

## 2013-04-14 MED ORDER — ONDANSETRON 4 MG PO TBDP: 4.0000 mg | ORAL_TABLET | Freq: Three times a day (TID) | ORAL | Status: DC | PRN

## 2013-04-14 MED ORDER — GI COCKTAIL ~~LOC~~
30.0000 mL | Freq: Once | ORAL | Status: AC
  Administered 2013-04-14: 30 mL via ORAL
  Filled 2013-04-14: qty 30

## 2013-04-14 MED ORDER — ONDANSETRON HCL 4 MG/2ML IJ SOLN
4.0000 mg | Freq: Once | INTRAMUSCULAR | Status: AC
  Administered 2013-04-14: 4 mg via INTRAVENOUS

## 2013-04-14 MED ORDER — ONDANSETRON HCL 4 MG/2ML IJ SOLN
4.0000 mg | Freq: Once | INTRAMUSCULAR | Status: DC
  Filled 2013-04-14: qty 2

## 2013-04-14 MED ORDER — PANTOPRAZOLE SODIUM 40 MG IV SOLR
40.0000 mg | Freq: Once | INTRAVENOUS | Status: AC
  Administered 2013-04-14: 40 mg via INTRAVENOUS
  Filled 2013-04-14: qty 40

## 2013-04-14 MED ORDER — SUCRALFATE 1 G PO TABS: 1.0000 g | ORAL_TABLET | Freq: Three times a day (TID) | ORAL | Status: DC

## 2013-04-14 MED ORDER — SODIUM CHLORIDE 0.9 % IV SOLN
Freq: Once | INTRAVENOUS | Status: AC
  Administered 2013-04-14: 22:00:00 via INTRAVENOUS

## 2013-04-14 MED ORDER — POTASSIUM CHLORIDE ER 10 MEQ PO TBCR: 10.0000 meq | EXTENDED_RELEASE_TABLET | Freq: Every day | ORAL | Status: DC

## 2013-04-14 MED ORDER — ONDANSETRON HCL 4 MG/2ML IJ SOLN
4.0000 mg | Freq: Once | INTRAMUSCULAR | Status: AC
  Administered 2013-04-14: 4 mg via INTRAVENOUS
  Filled 2013-04-14: qty 2

## 2013-04-14 NOTE — ED Notes (Signed)
Radiology at [facility]

## 2013-04-14 NOTE — ED Provider Notes (Signed)
CSN: [id number]     Arrival date & time 04/14/13  2121 History   First MD Initiated Contact with Patient 04/14/13 2124     Chief Complaint  Patient presents with  . Chest Pain  . Abdominal Pain   (Consider location/radiation/quality/duration/timing/severity/associated sxs/prior Treatment) HPI Comments: Pt with 77 y/o female with hx of severe GERD, also with a history of a Nissen fundoplication which was performed approximately 2 years ago and had since failed according to the patient. She does develop intermittent episodes of severe acid reflux, this evening this started approximately 4 hours ago. She is passing a lot of gas, belching, feels bloated and nauseated. She denies having chest pain or abdominal pain, no swelling, fever, chills, cough, headache. She takes her daily antacids and indeed took Rolaids this evening which she states did not help. She reports being told that she may need a repeat surgery to control her severe symptoms, she had a stress test approximately 7-10 days ago which she reports was totally normal. She denies a history of alcohol use or pancreatitis.  Patient is a 77 y.o. female presenting with chest pain and abdominal pain. The history is provided by the patient.  Chest Pain Associated symptoms: abdominal pain   Abdominal Pain Associated symptoms: chest pain     Past Medical History  Diagnosis Date  . CAD (coronary artery disease)     LHC 2003 with 40% pLAD, 30% mLAD, 100% D1 (moderate vessel), 100%  D2 (small vessel), 95% mid small OM2.  Pt had PTCA to D1;  D2 and OM2 small vessels and tx medically;  Last Myoview (2012): anterior infarct seen with scar, no ischemia. EF normal. Pt managed medically;  Lexiscan Myoview (12/14):  Low risk; ant scar with very mild peri-infarct ischemia; EF 55% with ant AK   . Recurrent aspiration bronchitis/pneumonia     PFTs 12/09 FVC 97%, FEV1 101%, ratio 77%, TLC 109%. No significant obstruction or restriction.   . Hypertension   .  Hyperlipidemia   . Esophageal stricture   . Diverticulosis of colon   . Gastroesophageal reflux disease with hiatal hernia   . Iron deficiency anemia   . Gastritis   . GERD (gastroesophageal reflux disease)   . Hiatal hernia   . Asthma   . Blood transfusion   . Cough   . Wheezing   . Weakness   . Trouble swallowing   . Abdominal pain   . Hiatal hernia   . Shortness of breath   . Anxiety    Past Surgical History  Procedure Laterality Date  . Back surgery    . Cholecystectomy    . Bladder repair    . Hemorrhoid surgery    . Cholecystectomy      Patient unsure of date.  [name] Appendectomy      patient unsure of date  . Total abdominal hysterectomy  1975    partial  . Coronary angioplasty  11/16/2001  . Eye surgery  11/2000    bilateral cataracts with lens implant  . Hiatal hernia repair  06/03/2011    Procedure: LAPAROSCOPIC REPAIR OF HIATAL HERNIA;  Surgeon: Ernestene Mention, MD;  Location: [facility];  Service: General;  Laterality: N/A;  . Laparoscopic nissen fundoplication  06/03/2011    Procedure: LAPAROSCOPIC NISSEN FUNDOPLICATION;  Surgeon: Ernestene Mention, MD;  Location: [facility];  Service: General;  Laterality: N/A;  . Lumbar laminectomy/decompression microdiscectomy Right 06/26/2012    Procedure: LUMBAR LAMINECTOMY/DECOMPRESSION MICRODISCECTOMY;  Surgeon: Kerrin Champagne,  MD;  Location: [facility] OR;  Service: Orthopedics;  Laterality: Right;  RIGHT L4-5 MICRODISCECTOMY  . Esophagogastroduodenoscopy N/A 03/15/2013    Procedure: ESOPHAGOGASTRODUODENOSCOPY (EGD);  Surgeon: Beverley Fiedler, MD;  Location: [facility] ENDOSCOPY;  Service: Endoscopy;  Laterality: N/A;  . Esophageal manometry N/A 03/25/2013    Procedure: ESOPHAGEAL MANOMETRY (EM);  Surgeon: Louis Meckel, MD;  Location: [facility] ENDOSCOPY;  Service: Endoscopy;  Laterality: N/A;   Family History  Problem Relation Age of Onset  . Cancer Mother     colon   . Colon cancer Mother 28  . Heart disease Father 71    heart attack   History   Substance Use Topics  . Smoking status: Never Smoker   . Smokeless tobacco: Never Used  . Alcohol Use: No   OB History   Grav Para Term Preterm Abortions TAB SAB Ect Mult Living                 Review of Systems  Cardiovascular: Positive for chest pain.  Gastrointestinal: Positive for abdominal pain.  All other systems reviewed and are negative.    Allergies  Aspartame; Daliresp; and Ace inhibitors  Home Medications   Current Outpatient Rx  Name  Route  Sig  Dispense  Refill  . Aclidinium Bromide (TUDORZA PRESSAIR) 400 MCG/ACT AEPB   Inhalation   Inhale 1 puff into the lungs 2 (two) times daily.         [name] ALPRAZolam (XANAX) 0.5 MG tablet   Oral   Take 0.5 mg by mouth 3 (three) times daily as needed for sleep or anxiety.          [name] amLODipine (NORVASC) 10 MG tablet   Oral   Take 10 mg by mouth every morning.          . calcitonin, salmon, (MIACALCIN/FORTICAL) 200 UNIT/ACT nasal spray   Nasal   Place 1 spray into the nose every morning. She alternates nostrils every other day.         . Cholecalciferol (VITAMIN D3) 2000 UNITS TABS   Oral   Take 6,000 Units by mouth daily. She takes two tablets in the morning and one tablet at bedtime.         . Cyanocobalamin (VITAMIN B 12 PO)   Oral   Take 1 tablet by mouth every morning.          . docusate sodium 100 MG CAPS   Oral   Take 100 mg by mouth 2 (two) times daily.   10 capsule   0   . feeding supplement, RESOURCE BREEZE, (RESOURCE BREEZE) LIQD   Oral   Take 1 Container by mouth daily at 3 pm.   1 Container   0   . hydrochlorothiazide 25 MG tablet   Oral   Take 25 mg by mouth daily with breakfast.          . HYDROcodone-acetaminophen (NORCO) 7.5-325 MG per tablet   Oral   Take 1 tablet by mouth every 3 (three) hours as needed.   30 tablet   0   . losartan (COZAAR) 100 MG tablet   Oral   Take 100 mg by mouth every morning.          . metoCLOPramide (REGLAN) 10 MG tablet   Oral    Take 1 tablet (10 mg total) by mouth every 6 (six) hours as needed (Nausea).   20 tablet   0   . metoprolol tartrate (LOPRESSOR) 25 MG tablet  Oral   Take 12.5 mg by mouth at bedtime.         . mometasone-formoterol (DULERA) 200-5 MCG/ACT AERO   Inhalation   Inhale 2 puffs into the lungs 2 (two) times daily.         . Multiple Vitamin (MULTIVITAMIN WITH MINERALS) TABS   Oral   Take 1 tablet by mouth every morning.          [name] OVER THE COUNTER MEDICATION   Oral   Take 1,000 mg by mouth every other day. Coral Calcium 1000mg          . OVER THE COUNTER MEDICATION   Oral   Take 400 mg by mouth 3 (three) times daily. Magnesium Carbonate 400mg          . pantoprazole (PROTONIX) 40 MG tablet   Oral   Take 40 mg by mouth 2 (two) times daily.         . pravastatin (PRAVACHOL) 40 MG tablet   Oral   Take 40 mg by mouth at bedtime.          . promethazine (PHENERGAN) 12.5 MG tablet   Oral   Take 1 tablet (12.5 mg total) by mouth every 6 (six) hours as needed for nausea.   12 tablet   0   . senna (SENOKOT) 8.6 MG TABS tablet   Oral   Take 1 tablet (8.6 mg total) by mouth daily.   120 each   0   . ondansetron (ZOFRAN ODT) 4 MG disintegrating tablet   Oral   Take 1 tablet (4 mg total) by mouth every 8 (eight) hours as needed for nausea.   10 tablet   0   . ondansetron (ZOFRAN) 4 MG tablet   Oral   Take 1 tablet (4 mg total) by mouth every 8 (eight) hours as needed for nausea or vomiting.   36 tablet   5   . potassium chloride (K-DUR) 10 MEQ tablet   Oral   Take 1 tablet (10 mEq total) by mouth 2 (two) times daily.   60 tablet   1   . sucralfate (CARAFATE) 1 G tablet   Oral   Take 1 tablet (1 g total) by mouth 4 (four) times daily -  with meals and at bedtime.   30 tablet   1    BP 154/71  Pulse 80  Temp(Src) 97.6 F (36.4 C) (Oral)  Resp 11  Ht 5\' 5"  (1.651 m)  Wt 151 lb (68.493 kg)  BMI 25.13 kg/m2  SpO2 99% Physical Exam  Nursing note and  vitals reviewed. Constitutional: She appears well-developed and well-nourished. No distress.  HENT:  Head: Normocephalic and atraumatic.  Mouth/Throat: Oropharynx is clear and moist. No oropharyngeal exudate.  Eyes: Conjunctivae and EOM are normal. Pupils are equal, round, and reactive to light. Right eye exhibits no discharge. Left eye exhibits no discharge. No scleral icterus.  Neck: Normal range of motion. Neck supple. No JVD present. No thyromegaly present.  Cardiovascular: Normal rate, regular rhythm, normal heart sounds and intact distal pulses.  Exam reveals no gallop and no friction rub.   No murmur heard. Pulmonary/Chest: Effort normal and breath sounds normal. No respiratory distress. She has no wheezes. She has no rales.  Abdominal: Soft. Bowel sounds are normal. She exhibits no distension and no mass. There is tenderness ( Mild to moderate epigastric tenderness).  Musculoskeletal: Normal range of motion. She exhibits no edema and no tenderness.  Lymphadenopathy:  She has no cervical adenopathy.  Neurological: She is alert. Coordination normal.  Skin: Skin is warm and dry. No rash noted. No erythema.  Psychiatric: She has a normal mood and affect. Her behavior is normal.    ED Course  Procedures (including critical care time) Labs Review Labs Reviewed  COMPREHENSIVE METABOLIC PANEL - Abnormal; Notable for the following:    Potassium 3.0 (*)    Glucose, Bld 114 (*)    Creatinine, Ser 1.22 (*)    Calcium 10.6 (*)    Total Protein 8.5 (*)    GFR calc non Af Amer 42 (*)    GFR calc Af Amer 49 (*)    All other components within normal limits  CBC - Abnormal; Notable for the following:    RDW 16.1 (*)    All other components within normal limits  LIPASE, BLOOD  POCT I-STAT TROPONIN I   Imaging Review Dg Chest Port 1 View  04/14/2013   CLINICAL DATA:  Shortness of breath.  History of asthma.  EXAM: PORTABLE CHEST - 1 VIEW  COMPARISON:  Chest radiograph and CTA of the  chest performed 03/12/2013  FINDINGS: The lungs are well-aerated and clear. There is no evidence of focal opacification, pleural effusion or pneumothorax.  The cardiomediastinal silhouette is within normal limits. No acute osseous abnormalities are seen.  IMPRESSION: No acute cardiopulmonary process seen.   Electronically Signed   By: Roanna Raider M.D.   On: 04/14/2013 22:38    EKG Interpretation    Date/Time:  Sunday April 14 2013 21:30:23 EST Ventricular Rate:  98 PR Interval:  141 QRS Duration: 93 QT Interval:  423 QTC Calculation: 540 R Axis:   61 Text Interpretation:  Sinus rhythm Low voltage, precordial leads Abnormal lateral Q waves Borderline repolarization abnormality Prolonged QT interval Baseline wander in lead(s) I III aVR aVL aVF since last tracing no significant change Confirmed by Cayetano Mikita  MD, Mahonri Seiden ([id number]) on 04/14/2013 9:37:55 PM            MDM   1. Acid reflux   2. Hypokalemia    The patient does appear uncomfortable, her EKG shows normal sinus rhythm with normal intervals and no signs of ischemia. We'll obtain a lipase and a chest x-ray to further evaluate for the size of her hiatal hernia, labs like a CMP. We'll also give symptomatic medications for acid reflux. Doubt cardiac source.  Patient's symptoms improved significantly with medications as below, she was also given potassium replacement. Prior to discharge the patient was informed of all of her results, her treatment plan and was in agreement. She appears stable for discharge.   Meds given in ED:  Medications  pantoprazole (PROTONIX) injection 40 mg (40 mg Intravenous Given 04/14/13 2146)  famotidine (PEPCID) IVPB 20 mg (0 mg Intravenous Stopped 04/14/13 2221)  gi cocktail (Maalox,Lidocaine,Donnatal) (30 mLs Oral Given 04/14/13 2145)  0.9 %  sodium chloride infusion ( Intravenous Stopped 04/14/13 2230)  ondansetron (ZOFRAN) injection 4 mg (4 mg Intravenous Given 04/14/13 2201)  ondansetron (ZOFRAN)  injection 4 mg (4 mg Intravenous Given 04/14/13 2309)  potassium chloride 10 mEq in 100 mL IVPB (0 mEq Intravenous Stopped 04/15/13 0055)    Discharge Medication List as of 04/14/2013 11:51 PM    START taking these medications   Details  ondansetron (ZOFRAN ODT) 4 MG disintegrating tablet Take 1 tablet (4 mg total) by mouth every 8 (eight) hours as needed for nausea., Starting 04/14/2013, Until Discontinued, Print    sucralfate (  CARAFATE) 1 G tablet Take 1 tablet (1 g total) by mouth 4 (four) times daily -  with meals and at bedtime., Starting 04/14/2013, Until Discontinued, Print    potassium chloride (K-DUR) 10 MEQ tablet Take 1 tablet (10 mEq total) by mouth daily., Starting 04/14/2013, Until Discontinued, Print          Vida Roller, MD 04/16/13 0110

## 2013-04-14 NOTE — ED Notes (Signed)
Discontinued zofran as it was a duplicate order.

## 2013-04-14 NOTE — ED Notes (Signed)
Pt states she has had indigestion for three and half hours, pt states she has had very bad gas. Pt states she is burping frequently and is very nauseous. Pt denies any vomiting or diarrhea. Pt states she has a hx of a hiatal hernia. Pt states she had a stress test last Monday. Pt is A&O X4. Pt c/o upper abdominal pain that radiates to her central chest.

## 2013-04-15 ENCOUNTER — Encounter: Payer: Self-pay | Admitting: Physician Assistant

## 2013-04-15 ENCOUNTER — Encounter (INDEPENDENT_AMBULATORY_CARE_PROVIDER_SITE_OTHER): Payer: Self-pay

## 2013-04-15 ENCOUNTER — Ambulatory Visit (INDEPENDENT_AMBULATORY_CARE_PROVIDER_SITE_OTHER): Payer: Medicare Other | Admitting: Physician Assistant

## 2013-04-15 VITALS — BP 140/78 | HR 100 | Temp 98.6°F | Resp 16 | Ht 63.75 in | Wt 151.0 lb

## 2013-04-15 DIAGNOSIS — E876 Hypokalemia: Secondary | ICD-10-CM

## 2013-04-15 DIAGNOSIS — R112 Nausea with vomiting, unspecified: Secondary | ICD-10-CM

## 2013-04-15 LAB — BASIC METABOLIC PANEL WITH GFR
CO2: 24 mEq/L (ref 19–32)
Chloride: 104 mEq/L (ref 96–112)
Creat: 1.07 mg/dL (ref 0.50–1.10)
Glucose, Bld: 122 mg/dL — ABNORMAL HIGH (ref 70–99)
Sodium: 143 mEq/L (ref 135–145)

## 2013-04-15 LAB — MAGNESIUM: Magnesium: 1.8 mg/dL (ref 1.5–2.5)

## 2013-04-15 MED ORDER — PROMETHAZINE HCL 25 MG/ML IJ SOLN
25.0000 mg | Freq: Once | INTRAMUSCULAR | Status: AC
  Administered 2013-04-15: 25 mg via INTRAMUSCULAR

## 2013-04-15 MED ORDER — POTASSIUM CHLORIDE ER 10 MEQ PO TBCR: 10.0000 meq | EXTENDED_RELEASE_TABLET | Freq: Two times a day (BID) | ORAL | Status: DC

## 2013-04-15 MED ORDER — ONDANSETRON HCL 4 MG PO TABS: 4.0000 mg | ORAL_TABLET | Freq: Three times a day (TID) | ORAL | Status: DC | PRN

## 2013-04-15 NOTE — Progress Notes (Signed)
HPI Patient presents for an ER follow up. She went to the ER last night after eating applesauce for severe abdominal/chest pain and nausea, vomited X1.  She has a history of a Nissen fundoplication which was performed approximately 2 years ago but she states this did not work and she is suppose to scheduled for a repeat procedure in Jan. She had neg troponin, neg CXR and hypokalemia in the ER last night, she was given IV potassium and sent home with zofran. She also has had a recent normal myoview stress test in preparation for the surgery.She has continuing nausea, she is on zofran, she has carafate and reglan that she is not taking.   Lab Results  Component Value Date   CREATININE 1.22* 04/14/2013   BUN 19 04/14/2013   NA 139 04/14/2013   K 3.0* 04/14/2013   CL 97 04/14/2013   CO2 24 04/14/2013    Past Medical History  Diagnosis Date  . CAD (coronary artery disease)     LHC 2003 with 40% pLAD, 30% mLAD, 100% D1 (moderate vessel), 100%  D2 (small vessel), 95% mid small OM2.  Pt had PTCA to D1;  D2 and OM2 small vessels and tx medically;  Last Myoview (2012): anterior infarct seen with scar, no ischemia. EF normal. Pt managed medically;  Lexiscan Myoview (12/14):  Low risk; ant scar with very mild peri-infarct ischemia; EF 55% with ant AK   . Recurrent aspiration bronchitis/pneumonia     PFTs 12/09 FVC 97%, FEV1 101%, ratio 77%, TLC 109%. No significant obstruction or restriction.   . Hypertension   . Hyperlipidemia   . Esophageal stricture   . Diverticulosis of colon   . Gastroesophageal reflux disease with hiatal hernia   . Iron deficiency anemia   . Gastritis   . GERD (gastroesophageal reflux disease)   . Hiatal hernia   . Asthma   . Blood transfusion   . Cough   . Wheezing   . Weakness   . Trouble swallowing   . Abdominal pain   . Hiatal hernia   . Shortness of breath   . Anxiety      Allergies  Allergen Reactions  . Aspartame Diarrhea and Nausea And Vomiting  . Daliresp  [Roflumilast] Nausea And Vomiting  . Ace Inhibitors Cough    Per Dr Sherene Sires pulmonology 2013      Current Outpatient Prescriptions on File Prior to Visit  Medication Sig Dispense Refill  . Aclidinium Bromide (TUDORZA PRESSAIR) 400 MCG/ACT AEPB Inhale 1 puff into the lungs 2 (two) times daily.      [name] ALPRAZolam (XANAX) 0.5 MG tablet Take 0.5 mg by mouth 3 (three) times daily as needed for sleep or anxiety.       [name] amLODipine (NORVASC) 10 MG tablet Take 10 mg by mouth every morning.       . calcitonin, salmon, (MIACALCIN/FORTICAL) 200 UNIT/ACT nasal spray Place 1 spray into the nose every morning. She alternates nostrils every other day.      . Cholecalciferol (VITAMIN D3) 2000 UNITS TABS Take 6,000 Units by mouth daily. She takes two tablets in the morning and one tablet at bedtime.      . Cyanocobalamin (VITAMIN B 12 PO) Take 1 tablet by mouth every morning.       . docusate sodium 100 MG CAPS Take 100 mg by mouth 2 (two) times daily.  10 capsule  0  . feeding supplement, RESOURCE BREEZE, (RESOURCE BREEZE) LIQD Take 1 Container by mouth  daily at 3 pm.  1 Container  0  . hydrochlorothiazide 25 MG tablet Take 25 mg by mouth daily with breakfast.       . HYDROcodone-acetaminophen (NORCO) 7.5-325 MG per tablet Take 1 tablet by mouth every 3 (three) hours as needed.  30 tablet  0  . losartan (COZAAR) 100 MG tablet Take 100 mg by mouth every morning.       . metoCLOPramide (REGLAN) 10 MG tablet Take 1 tablet (10 mg total) by mouth every 6 (six) hours as needed (Nausea).  20 tablet  0  . metoprolol tartrate (LOPRESSOR) 25 MG tablet Take 12.5 mg by mouth at bedtime.      . mometasone-formoterol (DULERA) 200-5 MCG/ACT AERO Inhale 2 puffs into the lungs 2 (two) times daily.      . Multiple Vitamin (MULTIVITAMIN WITH MINERALS) TABS Take 1 tablet by mouth every morning.       . ondansetron (ZOFRAN ODT) 4 MG disintegrating tablet Take 1 tablet (4 mg total) by mouth every 8 (eight) hours as needed for nausea.   10 tablet  0  . OVER THE COUNTER MEDICATION Take 1,000 mg by mouth every other day. Coral Calcium 1000mg       . OVER THE COUNTER MEDICATION Take 400 mg by mouth 3 (three) times daily. Magnesium Carbonate 400mg       . pantoprazole (PROTONIX) 40 MG tablet Take 40 mg by mouth 2 (two) times daily.      . potassium chloride (K-DUR) 10 MEQ tablet Take 1 tablet (10 mEq total) by mouth daily.  7 tablet  1  . pravastatin (PRAVACHOL) 40 MG tablet Take 40 mg by mouth at bedtime.       . promethazine (PHENERGAN) 12.5 MG tablet Take 1 tablet (12.5 mg total) by mouth every 6 (six) hours as needed for nausea.  12 tablet  0  . senna (SENOKOT) 8.6 MG TABS tablet Take 1 tablet (8.6 mg total) by mouth daily.  120 each  0  . sucralfate (CARAFATE) 1 G tablet Take 1 tablet (1 g total) by mouth 4 (four) times daily -  with meals and at bedtime.  30 tablet  1   No current facility-administered medications on file prior to visit.    ROS: all negative expect above.   Physical: Filed Weights   04/15/13 1101  Weight: 151 lb (68.493 kg)   Filed Vitals:   04/15/13 1101  BP: 140/78  Pulse: 100  Temp: 98.6 F (37 C)  Resp: 16   General Appearance: Well nourished, in no apparent distress. Eyes: PERRLA, EOMs. Sinuses: No Frontal/maxillary tenderness ENT/Mouth: Ext aud canals clear, normal light reflex with TMs without erythema, bulging. Post pharynx without erythema, swelling, exudate.  Respiratory: CTAB Cardio: RRR, no murmurs, rubs or gallops. Peripheral pulses brisk and equal bilaterally, without edema. No aortic or femoral bruits. Abdomen: Flat, soft, with bowell sounds. + tender epigastric  no guarding, rebound. Lymphatics: Non tender without lymphadenopathy.  Musculoskeletal: Full ROM all peripheral extremities, 5/5 strength, and normal gait. Skin: Warm, dry without rashes, lesions, ecchymosis.  Neuro: Cranial nerves intact, reflexes equal bilaterally. Normal muscle tone, no cerebellar symptoms. Sensation  intact.  Pysch: Awake and oriented X 3, normal affect, Insight and Judgment appropriate.   Assessment and Plan: Nausea and abdominal pain with normal stress myoview, normal CXR, and history of H/H that is suppose to be repaired.  Suggest follow up with GI and see if she can have the procedure done sooner.  Phenergan  shot given in the office Start zofran 4mg  TID Carafate 1g can dissolve in water and drink or take like a pill 3 times a day.   Hypokalemia- givenIV K+ in the hospital Stop HCTZ until she starts better PO intake, recheck BMP Start on the KCL  BID.   Follow up Thursday or Friday this week

## 2013-04-15 NOTE — Patient Instructions (Addendum)
Start on the KCL 2 times a day Stop HCTZ Start zofran 4mg  TID Carafate 1g can dissolve in water and drink or take like a pill 3 times a day- this can make you constipated so please take stool softner.   Diet for Gastroesophageal Reflux Disease, Adult Reflux (acid reflux) is when acid from your stomach flows up into the esophagus. When acid comes in contact with the esophagus, the acid causes irritation and soreness (inflammation) in the esophagus. When reflux happens often or so severely that it causes damage to the esophagus, it is called gastroesophageal reflux disease (GERD). Nutrition therapy can help ease the discomfort of GERD. FOODS OR DRINKS TO AVOID OR LIMIT  Smoking or chewing tobacco. Nicotine is one of the most potent stimulants to acid production in the gastrointestinal tract.  Caffeinated and decaffeinated coffee and black tea.  Regular or low-calorie carbonated beverages or energy drinks (caffeine-free carbonated beverages are allowed).   Strong spices, such as black pepper, white pepper, red pepper, cayenne, curry powder, and chili powder.  Peppermint or spearmint.  Chocolate.  High-fat foods, including meats and fried foods. Extra added fats including oils, butter, salad dressings, and nuts. Limit these to less than 8 tsp per day.  Fruits and vegetables if they are not tolerated, such as citrus fruits or tomatoes.  Alcohol.  Any food that seems to aggravate your condition. If you have questions regarding your diet, call your caregiver or a registered dietitian. OTHER THINGS THAT MAY HELP GERD INCLUDE:   Eating your meals slowly, in a relaxed setting.  Eating 5 to 6 small meals per day instead of 3 large meals.  Eliminating food for a period of time if it causes distress.  Not lying down until 3 hours after eating a meal.  Keeping the head of your bed raised 6 to 9 inches (15 to 23 cm) by using a foam wedge or blocks under the legs of the bed. Lying flat  may make symptoms worse.  Being physically active. Weight loss may be helpful in reducing reflux in overweight or obese adults.  Wear loose fitting clothing EXAMPLE MEAL PLAN This meal plan is approximately 2,000 calories based on https://www.[facility].org/ meal planning guidelines. Breakfast   cup cooked oatmeal.  1 cup strawberries.  1 cup low-fat milk.  1 oz almonds. Snack  1 cup cucumber slices.  6 oz yogurt (made from low-fat or fat-free milk). Lunch  2 slice whole-wheat bread.  2 oz sliced [place].  2 tsp mayonnaise.  1 cup blueberries.  1 cup snap peas. Snack  6 whole-wheat crackers.  1 oz string cheese. Dinner   cup brown rice.  1 cup mixed veggies.  1 tsp olive oil.  3 oz grilled fish. Document Released: 04/25/2005 Document Revised: 07/18/2011 Document Reviewed: 03/11/2011 [facility] Patient Information 2014 [place], [place].

## 2013-04-16 ENCOUNTER — Telehealth (INDEPENDENT_AMBULATORY_CARE_PROVIDER_SITE_OTHER): Payer: Self-pay

## 2013-04-16 NOTE — Telephone Encounter (Signed)
Message copied by Ethlyn Gallery on Tue Apr 16, 2013  3:57 PM ------      Message from: [facility]      Created: Tue Apr 16, 2013  3:25 PM      Contact: [phone]       Please call her she needs to know if it ok for her to drink ensure ------

## 2013-04-16 NOTE — Telephone Encounter (Signed)
Returned pt's call. I advised pt that it would be ok for her to drink ensure before surgery. The pt understands.

## 2013-04-18 ENCOUNTER — Encounter: Payer: Self-pay | Admitting: Internal Medicine

## 2013-04-18 ENCOUNTER — Other Ambulatory Visit: Payer: Self-pay | Admitting: Specialist

## 2013-04-18 ENCOUNTER — Telehealth (INDEPENDENT_AMBULATORY_CARE_PROVIDER_SITE_OTHER): Payer: Self-pay | Admitting: *Deleted

## 2013-04-18 ENCOUNTER — Telehealth (INDEPENDENT_AMBULATORY_CARE_PROVIDER_SITE_OTHER): Payer: Self-pay | Admitting: Surgery

## 2013-04-18 DIAGNOSIS — M79604 Pain in right leg: Secondary | ICD-10-CM

## 2013-04-18 NOTE — Telephone Encounter (Signed)
Called pt's husband back to let him know that Florentina Addison did look to see about moving this surgery date up earlier but as of right now there is no early OR time. I advised pt's husband that Dr Michaell Cowing is not in the office this week but we will let him know when he returns next week. I asked what was going on with the pt and he said she is just not able to eat any solid foods. The pt is also shaking all over body that looks like she has the cold chills all the time. I advised him that the shaking issue needs to be addressed with pt's medical doctor. The pt just saw Dr Oneta Rack this week and he said he couldn't find a reason for the shaking with the pt. The pt see's Dr Oneta Rack in the a.m. And I told them to tell them again about the shaking issue b/c that should not be related to her paraesophageal hernia. I advised the pt needs to be drinking plenty of fluids not to get dehydrated if she is not eating or drinking. The pt is drinking some the pt's husband said and he did say the pt tried drinking ensure/ boost. The pt can't tolerate ensure or boost b/c makes her gag. The pt is eating applesauce but that is pretty much it. I advised for the pt to get some baby food and gatorade. I advised pt's husband that I will notify Dr Michaell Cowing.

## 2013-04-18 NOTE — Telephone Encounter (Signed)
Patient's husband called asking about the surgery for patient being moved up.  Per Florentina Addison, surgery schedulers, there is no available time prior to 05/07/13.  Explained to husband that there is no available surgical time so if husband believes that this is urgent he would need to take patient to the ED to be evaluated.  Explained that if the ED feels as though the patient is unable to wait on this surgery and this is urgent then they have to ability to consult [place].  Husband states understanding and agreeable at this time.

## 2013-04-18 NOTE — Telephone Encounter (Signed)
[name] [name] called left message that [name] [name] cannot do anything not eat not drink always shakes panic anxoius wants surgery moved up There is not 4 hours available at either OR before 12/30  Please call patient

## 2013-04-19 ENCOUNTER — Encounter: Payer: Self-pay | Admitting: Physician Assistant

## 2013-04-19 ENCOUNTER — Ambulatory Visit (INDEPENDENT_AMBULATORY_CARE_PROVIDER_SITE_OTHER): Payer: Medicare Other | Admitting: Physician Assistant

## 2013-04-19 VITALS — BP 128/68 | HR 84 | Temp 98.6°F | Resp 16 | Wt 151.0 lb

## 2013-04-19 DIAGNOSIS — G8929 Other chronic pain: Secondary | ICD-10-CM

## 2013-04-19 DIAGNOSIS — E876 Hypokalemia: Secondary | ICD-10-CM

## 2013-04-19 DIAGNOSIS — R112 Nausea with vomiting, unspecified: Secondary | ICD-10-CM

## 2013-04-19 LAB — BASIC METABOLIC PANEL WITH GFR
BUN: 15 mg/dL (ref 6–23)
CO2: 27 mEq/L (ref 19–32)
Calcium: 10.2 mg/dL (ref 8.4–10.5)
Chloride: 102 mEq/L (ref 96–112)
GFR, Est African American: 51 mL/min — ABNORMAL LOW
GFR, Est Non African American: 44 mL/min — ABNORMAL LOW
Potassium: 4.3 mEq/L (ref 3.5–5.3)
Sodium: 140 mEq/L (ref 135–145)

## 2013-04-19 MED ORDER — PROMETHAZINE HCL 25 MG/ML IJ SOLN
25.0000 mg | Freq: Once | INTRAMUSCULAR | Status: AC
  Administered 2013-04-19: 25 mg via INTRAMUSCULAR

## 2013-04-19 NOTE — Patient Instructions (Addendum)
Continue the zofran and carafate up to three times a day, you can try to do the carafate only twice a day but please increase it if you start to have pain.  A lot of these medications can cause constipation so please take a stool softener.  Add on reglan morning and lunch to help decrease the risk of constipation and help decrease nausea  With the potassium take 1/2 half of a pill daily for now until we get your labs back.   Can use Voltern gel sample on your hip in the mean time.  Start taking the Hydrocodone once in the morning then as needed throughout the day to help prevent your pain level from getting too high.

## 2013-04-19 NOTE — Progress Notes (Signed)
HPI Patient presents for a one week follow up. Last visit she was an ER follow up for AB pain and nausea. She was started on zofran, carafate, and KCL which she was suppose to only take one of daily but states she has been taking 2. She is scheduled to have a hiatal hernia repair on 05/07/13. Currently she states she is doing better, still having trouble eating but is eating baby food, no longer having constipation but having diarrhea for 2 days. She has been very nervous as well.   Patient is seeing Dr. Alvester Morin for her right hip pain and had injections 3-4 weeks ago. She continues to have pain and is suppose to have a MRI. She has not been taking her Norco due to constipation.   Past Medical History  Diagnosis Date  . CAD (coronary artery disease)     LHC 2003 with 40% pLAD, 30% mLAD, 100% D1 (moderate vessel), 100%  D2 (small vessel), 95% mid small OM2.  Pt had PTCA to D1;  D2 and OM2 small vessels and tx medically;  Last Myoview (2012): anterior infarct seen with scar, no ischemia. EF normal. Pt managed medically;  Lexiscan Myoview (12/14):  Low risk; ant scar with very mild peri-infarct ischemia; EF 55% with ant AK   . Recurrent aspiration bronchitis/pneumonia     PFTs 12/09 FVC 97%, FEV1 101%, ratio 77%, TLC 109%. No significant obstruction or restriction.   . Hypertension   . Hyperlipidemia   . Esophageal stricture   . Diverticulosis of colon   . Gastroesophageal reflux disease with hiatal hernia   . Iron deficiency anemia   . Gastritis   . GERD (gastroesophageal reflux disease)   . Hiatal hernia   . Asthma   . Blood transfusion   . Cough   . Wheezing   . Weakness   . Trouble swallowing   . Abdominal pain   . Hiatal hernia   . Shortness of breath   . Anxiety      Allergies  Allergen Reactions  . Aspartame Diarrhea and Nausea And Vomiting  . Daliresp [Roflumilast] Nausea And Vomiting  . Ace Inhibitors Cough    Per Dr Sherene Sires pulmonology 2013      Current Outpatient  Prescriptions on File Prior to Visit  Medication Sig Dispense Refill  . Aclidinium Bromide (TUDORZA PRESSAIR) 400 MCG/ACT AEPB Inhale 1 puff into the lungs 2 (two) times daily.      [name] ALPRAZolam (XANAX) 0.5 MG tablet Take 0.5 mg by mouth 3 (three) times daily as needed for sleep or anxiety.       [name] amLODipine (NORVASC) 10 MG tablet Take 10 mg by mouth every morning.       . calcitonin, salmon, (MIACALCIN/FORTICAL) 200 UNIT/ACT nasal spray Place 1 spray into the nose every morning. She alternates nostrils every other day.      . Cholecalciferol (VITAMIN D3) 2000 UNITS TABS Take 6,000 Units by mouth daily. She takes two tablets in the morning and one tablet at bedtime.      . Cyanocobalamin (VITAMIN B 12 PO) Take 1 tablet by mouth every morning.       . docusate sodium 100 MG CAPS Take 100 mg by mouth 2 (two) times daily.  10 capsule  0  . feeding supplement, RESOURCE BREEZE, (RESOURCE BREEZE) LIQD Take 1 Container by mouth daily at 3 pm.  1 Container  0  . hydrochlorothiazide 25 MG tablet Take 25 mg by mouth daily with breakfast.       .  HYDROcodone-acetaminophen (NORCO) 7.5-325 MG per tablet Take 1 tablet by mouth every 3 (three) hours as needed.  30 tablet  0  . losartan (COZAAR) 100 MG tablet Take 100 mg by mouth every morning.       . metoCLOPramide (REGLAN) 10 MG tablet Take 1 tablet (10 mg total) by mouth every 6 (six) hours as needed (Nausea).  20 tablet  0  . metoprolol tartrate (LOPRESSOR) 25 MG tablet Take 12.5 mg by mouth at bedtime.      . mometasone-formoterol (DULERA) 200-5 MCG/ACT AERO Inhale 2 puffs into the lungs 2 (two) times daily.      . Multiple Vitamin (MULTIVITAMIN WITH MINERALS) TABS Take 1 tablet by mouth every morning.       . ondansetron (ZOFRAN ODT) 4 MG disintegrating tablet Take 1 tablet (4 mg total) by mouth every 8 (eight) hours as needed for nausea.  10 tablet  0  . ondansetron (ZOFRAN) 4 MG tablet Take 1 tablet (4 mg total) by mouth every 8 (eight) hours as needed  for nausea or vomiting.  36 tablet  5  . OVER THE COUNTER MEDICATION Take 1,000 mg by mouth every other day. Coral Calcium 1000mg       . OVER THE COUNTER MEDICATION Take 400 mg by mouth 3 (three) times daily. Magnesium Carbonate 400mg       . pantoprazole (PROTONIX) 40 MG tablet Take 40 mg by mouth 2 (two) times daily.      . potassium chloride (K-DUR) 10 MEQ tablet Take 1 tablet (10 mEq total) by mouth 2 (two) times daily.  60 tablet  1  . pravastatin (PRAVACHOL) 40 MG tablet Take 40 mg by mouth at bedtime.       . promethazine (PHENERGAN) 12.5 MG tablet Take 1 tablet (12.5 mg total) by mouth every 6 (six) hours as needed for nausea.  12 tablet  0  . senna (SENOKOT) 8.6 MG TABS tablet Take 1 tablet (8.6 mg total) by mouth daily.  120 each  0  . sucralfate (CARAFATE) 1 G tablet Take 1 tablet (1 g total) by mouth 4 (four) times daily -  with meals and at bedtime.  30 tablet  1   No current facility-administered medications on file prior to visit.    ROS: all negative expect above.   Physical: Filed Weights   04/19/13 1106  Weight: 151 lb (68.493 kg)   Filed Vitals:   04/19/13 1106  BP: 128/68  Pulse: 84  Temp: 98.6 F (37 C)  Resp: 16   General Appearance: Well nourished, in no apparent distress. Eyes: PERRLA, EOMs. Sinuses: No Frontal/maxillary tenderness ENT/Mouth: Ext aud canals clear, normal light reflex with TMs without erythema, bulging. Post pharynx without erythema, swelling, exudate.  Respiratory: CTAB Cardio: RRR, no murmurs, rubs or gallops. Peripheral pulses brisk and equal bilaterally, without edema. No aortic or femoral bruits. Abdomen: Flat, soft, with bowl sounds. Nontender, no guarding, rebound. Lymphatics: Non tender without lymphadenopathy.  Musculoskeletal: Full ROM all peripheral extremities, 5/5 strength, and normal gait. Skin: Warm, dry without rashes, lesions, ecchymosis.  Neuro: Cranial nerves intact, reflexes equal bilaterally. Normal muscle tone, no  cerebellar symptoms. Sensation intact.  Pysch: Awake and oriented X 3, normal affect, Insight and Judgment appropriate.   Assessment and Plan: Nausea Continue the zofran and carafate up to three times a day Add on reglan morning and lunch to help decrease the risk of constipation and help decrease nausea Patient appears to be doing better but she is  requesting for another phenergan shot Hip pain Start taking the Hydrocodone once in the morning then as needed throughout the day to help prevent your pain level from getting too high.  Can use Voltern gel sample on your hip in the mean time.  Hypokalemia With the potassium take 1/2 half of a pill daily for now until we get your labs back.  Check BMP

## 2013-04-22 ENCOUNTER — Telehealth (INDEPENDENT_AMBULATORY_CARE_PROVIDER_SITE_OTHER): Payer: Self-pay | Admitting: *Deleted

## 2013-04-22 ENCOUNTER — Encounter (HOSPITAL_COMMUNITY): Payer: Self-pay | Admitting: Emergency Medicine

## 2013-04-22 ENCOUNTER — Inpatient Hospital Stay (HOSPITAL_COMMUNITY)
Admission: EM | Admit: 2013-04-22 | Discharge: 2013-04-26 | DRG: 326 | Disposition: A | Discharge status: Home / Self Care | Payer: Medicare Other | Attending: Surgery | Admitting: Surgery

## 2013-04-22 ENCOUNTER — Emergency Department (HOSPITAL_COMMUNITY): Payer: Medicare Other

## 2013-04-22 ENCOUNTER — Telehealth: Payer: Self-pay

## 2013-04-22 ENCOUNTER — Telehealth (INDEPENDENT_AMBULATORY_CARE_PROVIDER_SITE_OTHER): Payer: Self-pay

## 2013-04-22 DIAGNOSIS — R079 Chest pain, unspecified: Secondary | ICD-10-CM | POA: Diagnosis present

## 2013-04-22 DIAGNOSIS — K222 Esophageal obstruction: Secondary | ICD-10-CM | POA: Diagnosis present

## 2013-04-22 DIAGNOSIS — E785 Hyperlipidemia, unspecified: Secondary | ICD-10-CM | POA: Diagnosis present

## 2013-04-22 DIAGNOSIS — K660 Peritoneal adhesions (postprocedural) (postinfection): Secondary | ICD-10-CM | POA: Diagnosis present

## 2013-04-22 DIAGNOSIS — K440 Diaphragmatic hernia with obstruction, without gangrene: Principal | ICD-10-CM | POA: Diagnosis present

## 2013-04-22 DIAGNOSIS — E876 Hypokalemia: Secondary | ICD-10-CM | POA: Diagnosis not present

## 2013-04-22 DIAGNOSIS — R1310 Dysphagia, unspecified: Secondary | ICD-10-CM | POA: Diagnosis present

## 2013-04-22 DIAGNOSIS — Z9089 Acquired absence of other organs: Secondary | ICD-10-CM

## 2013-04-22 DIAGNOSIS — J45909 Unspecified asthma, uncomplicated: Secondary | ICD-10-CM | POA: Diagnosis present

## 2013-04-22 DIAGNOSIS — E43 Unspecified severe protein-calorie malnutrition: Secondary | ICD-10-CM | POA: Diagnosis present

## 2013-04-22 DIAGNOSIS — Z888 Allergy status to other drugs, medicaments and biological substances status: Secondary | ICD-10-CM

## 2013-04-22 DIAGNOSIS — E46 Unspecified protein-calorie malnutrition: Secondary | ICD-10-CM

## 2013-04-22 DIAGNOSIS — R634 Abnormal weight loss: Secondary | ICD-10-CM | POA: Diagnosis present

## 2013-04-22 DIAGNOSIS — J4520 Mild intermittent asthma, uncomplicated: Secondary | ICD-10-CM | POA: Diagnosis present

## 2013-04-22 DIAGNOSIS — R1013 Epigastric pain: Secondary | ICD-10-CM

## 2013-04-22 DIAGNOSIS — K5900 Constipation, unspecified: Secondary | ICD-10-CM | POA: Diagnosis present

## 2013-04-22 DIAGNOSIS — E860 Dehydration: Secondary | ICD-10-CM

## 2013-04-22 DIAGNOSIS — K3189 Other diseases of stomach and duodenum: Secondary | ICD-10-CM | POA: Diagnosis present

## 2013-04-22 DIAGNOSIS — K219 Gastro-esophageal reflux disease without esophagitis: Secondary | ICD-10-CM | POA: Diagnosis present

## 2013-04-22 DIAGNOSIS — K2970 Gastritis, unspecified, without bleeding: Secondary | ICD-10-CM | POA: Diagnosis present

## 2013-04-22 DIAGNOSIS — R109 Unspecified abdominal pain: Secondary | ICD-10-CM

## 2013-04-22 DIAGNOSIS — Z6826 Body mass index (BMI) 26.0-26.9, adult: Secondary | ICD-10-CM

## 2013-04-22 DIAGNOSIS — K449 Diaphragmatic hernia without obstruction or gangrene: Secondary | ICD-10-CM

## 2013-04-22 DIAGNOSIS — I10 Essential (primary) hypertension: Secondary | ICD-10-CM | POA: Diagnosis present

## 2013-04-22 DIAGNOSIS — R112 Nausea with vomiting, unspecified: Secondary | ICD-10-CM | POA: Diagnosis present

## 2013-04-22 DIAGNOSIS — F411 Generalized anxiety disorder: Secondary | ICD-10-CM | POA: Diagnosis present

## 2013-04-22 DIAGNOSIS — Z8249 Family history of ischemic heart disease and other diseases of the circulatory system: Secondary | ICD-10-CM

## 2013-04-22 DIAGNOSIS — D509 Iron deficiency anemia, unspecified: Secondary | ICD-10-CM | POA: Diagnosis present

## 2013-04-22 DIAGNOSIS — D472 Monoclonal gammopathy: Secondary | ICD-10-CM

## 2013-04-22 DIAGNOSIS — K5909 Other constipation: Secondary | ICD-10-CM | POA: Diagnosis present

## 2013-04-22 DIAGNOSIS — Z9861 Coronary angioplasty status: Secondary | ICD-10-CM

## 2013-04-22 DIAGNOSIS — I2510 Atherosclerotic heart disease of native coronary artery without angina pectoris: Secondary | ICD-10-CM | POA: Diagnosis present

## 2013-04-22 HISTORY — DX: Transient cerebral ischemic attack, unspecified: G45.9

## 2013-04-22 HISTORY — DX: Other specified postprocedural states: Z98.890

## 2013-04-22 HISTORY — DX: Nausea with vomiting, unspecified: R11.2

## 2013-04-22 LAB — COMPREHENSIVE METABOLIC PANEL
ALT: 12 U/L (ref 0–35)
AST: 15 U/L (ref 0–37)
Albumin: 4.3 g/dL (ref 3.5–5.2)
Alkaline Phosphatase: 62 U/L (ref 39–117)
CO2: 21 mEq/L (ref 19–32)
Chloride: 103 mEq/L (ref 96–112)
Creatinine, Ser: 0.89 mg/dL (ref 0.50–1.10)
GFR calc non Af Amer: 62 mL/min — ABNORMAL LOW (ref >90)
Potassium: 3.4 mEq/L — ABNORMAL LOW (ref 3.5–5.1)
Sodium: 142 mEq/L (ref 135–145)
Total Protein: 7.5 g/dL (ref 6.0–8.3)

## 2013-04-22 LAB — CBC WITH DIFFERENTIAL/PLATELET
Basophils Absolute: 0 10*3/uL (ref 0.0–0.1)
Basophils Relative: 1 % (ref 0–1)
Eosinophils Relative: 2 % (ref 0–5)
HCT: 39.3 % (ref 36.0–46.0)
Hemoglobin: 13.5 g/dL (ref 12.0–15.0)
MCHC: 34.4 g/dL (ref 30.0–36.0)
MCV: 87.7 fL (ref 78.0–100.0)
Monocytes Absolute: 0.6 10*3/uL (ref 0.1–1.0)
Monocytes Relative: 9 % (ref 3–12)
Neutro Abs: 4.3 10*3/uL (ref 1.7–7.7)
Platelets: 344 10*3/uL (ref 150–400)
RDW: 16.5 % — ABNORMAL HIGH (ref 11.5–15.5)

## 2013-04-22 LAB — URINALYSIS, ROUTINE W REFLEX MICROSCOPIC
Bilirubin Urine: NEGATIVE
Hgb urine dipstick: NEGATIVE
Ketones, ur: 15 mg/dL — AB
Leukocytes, UA: NEGATIVE
Protein, ur: NEGATIVE mg/dL
Specific Gravity, Urine: 1.013 (ref 1.005–1.030)
Urobilinogen, UA: 0.2 mg/dL (ref 0.0–1.0)
pH: 8.5 — ABNORMAL HIGH (ref 5.0–8.0)

## 2013-04-22 LAB — LIPASE, BLOOD: Lipase: 21 U/L (ref 11–59)

## 2013-04-22 LAB — CREATININE, SERUM
GFR calc Af Amer: 75 mL/min — ABNORMAL LOW (ref >90)
GFR calc non Af Amer: 64 mL/min — ABNORMAL LOW (ref >90)

## 2013-04-22 LAB — CBC
HCT: 38.2 % (ref 36.0–46.0)
Hemoglobin: 13 g/dL (ref 12.0–15.0)
MCH: 30 pg (ref 26.0–34.0)
MCV: 88 fL (ref 78.0–100.0)
RBC: 4.34 MIL/uL (ref 3.87–5.11)
WBC: 5 10*3/uL (ref 4.0–10.5)

## 2013-04-22 LAB — POCT I-STAT TROPONIN I

## 2013-04-22 MED ORDER — ALPRAZOLAM 0.5 MG PO TABS
0.5000 mg | ORAL_TABLET | Freq: Three times a day (TID) | ORAL | Status: DC | PRN
  Administered 2013-04-23 (×2): 0.5 mg via ORAL
  Filled 2013-04-22 (×2): qty 1

## 2013-04-22 MED ORDER — MORPHINE SULFATE 4 MG/ML IJ SOLN
4.0000 mg | Freq: Once | INTRAMUSCULAR | Status: AC
  Administered 2013-04-22: 4 mg via INTRAVENOUS
  Filled 2013-04-22: qty 1

## 2013-04-22 MED ORDER — VITAMIN D3 25 MCG (1000 UNIT) PO TABS
6000.0000 [IU] | ORAL_TABLET | Freq: Every day | ORAL | Status: DC
  Administered 2013-04-22 – 2013-04-26 (×4): 6000 [IU] via ORAL
  Filled 2013-04-22 (×5): qty 6

## 2013-04-22 MED ORDER — HYDROCODONE-ACETAMINOPHEN 7.5-325 MG PO TABS
1.0000 | ORAL_TABLET | ORAL | Status: DC | PRN
  Administered 2013-04-23: 1 via ORAL
  Filled 2013-04-22: qty 1

## 2013-04-22 MED ORDER — PANTOPRAZOLE SODIUM 40 MG IV SOLR
40.0000 mg | Freq: Two times a day (BID) | INTRAVENOUS | Status: DC
  Administered 2013-04-22 – 2013-04-23 (×3): 40 mg via INTRAVENOUS
  Filled 2013-04-22 (×3): qty 40

## 2013-04-22 MED ORDER — CALCITONIN (SALMON) 200 UNIT/ACT NA SOLN
1.0000 | Freq: Every morning | NASAL | Status: DC
  Administered 2013-04-23 – 2013-04-26 (×3): 1 via NASAL
  Filled 2013-04-22: qty 3.7

## 2013-04-22 MED ORDER — SENNA 8.6 MG PO TABS
1.0000 | ORAL_TABLET | Freq: Every day | ORAL | Status: DC
  Administered 2013-04-23: 8.6 mg via ORAL
  Filled 2013-04-22: qty 1

## 2013-04-22 MED ORDER — PANTOPRAZOLE SODIUM 40 MG IV SOLR
40.0000 mg | Freq: Once | INTRAVENOUS | Status: AC
  Administered 2013-04-22: 40 mg via INTRAVENOUS
  Filled 2013-04-22: qty 40

## 2013-04-22 MED ORDER — ADULT MULTIVITAMIN W/MINERALS CH
1.0000 | ORAL_TABLET | Freq: Every morning | ORAL | Status: DC
  Administered 2013-04-23 – 2013-04-25 (×2): 1 via ORAL
  Filled 2013-04-22 (×4): qty 1

## 2013-04-22 MED ORDER — SUCRALFATE 1 G PO TABS
1.0000 g | ORAL_TABLET | Freq: Three times a day (TID) | ORAL | Status: DC
  Administered 2013-04-22 – 2013-04-23 (×6): 1 g via ORAL
  Filled 2013-04-22 (×11): qty 1

## 2013-04-22 MED ORDER — MOMETASONE FURO-FORMOTEROL FUM 200-5 MCG/ACT IN AERO
2.0000 | INHALATION_SPRAY | Freq: Two times a day (BID) | RESPIRATORY_TRACT | Status: DC
  Administered 2013-04-22 – 2013-04-26 (×7): 2 via RESPIRATORY_TRACT
  Filled 2013-04-22 (×2): qty 8.8

## 2013-04-22 MED ORDER — METOPROLOL TARTRATE 12.5 MG HALF TABLET
12.5000 mg | ORAL_TABLET | Freq: Every day | ORAL | Status: DC
  Administered 2013-04-22 – 2013-04-23 (×2): 12.5 mg via ORAL
  Filled 2013-04-22: qty 0.5
  Filled 2013-04-22 (×2): qty 1

## 2013-04-22 MED ORDER — LOSARTAN POTASSIUM 50 MG PO TABS
50.0000 mg | ORAL_TABLET | Freq: Every morning | ORAL | Status: DC
  Administered 2013-04-23: 50 mg via ORAL
  Filled 2013-04-22: qty 1

## 2013-04-22 MED ORDER — ACETAMINOPHEN 325 MG PO TABS: 650.0000 mg | ORAL_TABLET | Freq: Four times a day (QID) | ORAL | Status: DC | PRN

## 2013-04-22 MED ORDER — ONDANSETRON HCL 4 MG/2ML IJ SOLN
4.0000 mg | Freq: Once | INTRAMUSCULAR | Status: AC
  Administered 2013-04-22: 4 mg via INTRAVENOUS
  Filled 2013-04-22: qty 2

## 2013-04-22 MED ORDER — DIPHENHYDRAMINE HCL 50 MG/ML IJ SOLN: 12.5000 mg | Freq: Four times a day (QID) | INTRAMUSCULAR | Status: DC | PRN

## 2013-04-22 MED ORDER — MAGIC MOUTHWASH
15.0000 mL | Freq: Four times a day (QID) | ORAL | Status: DC | PRN
  Filled 2013-04-22: qty 15

## 2013-04-22 MED ORDER — VITAMIN D3 50 MCG (2000 UT) PO TABS: 6000.0000 [IU] | ORAL_TABLET | Freq: Every day | ORAL | Status: DC

## 2013-04-22 MED ORDER — ONDANSETRON HCL 4 MG/2ML IJ SOLN: 4.0000 mg | Freq: Four times a day (QID) | INTRAMUSCULAR | Status: DC | PRN

## 2013-04-22 MED ORDER — POTASSIUM CHLORIDE ER 10 MEQ PO TBCR
10.0000 meq | EXTENDED_RELEASE_TABLET | Freq: Two times a day (BID) | ORAL | Status: DC
  Administered 2013-04-22 – 2013-04-26 (×5): 10 meq via ORAL
  Filled 2013-04-22 (×10): qty 1

## 2013-04-22 MED ORDER — FLUCONAZOLE 200 MG PO TABS
200.0000 mg | ORAL_TABLET | Freq: Every day | ORAL | Status: AC
  Administered 2013-04-22 – 2013-04-23 (×2): 200 mg via ORAL
  Filled 2013-04-22 (×3): qty 1

## 2013-04-22 MED ORDER — AMLODIPINE BESYLATE 10 MG PO TABS
10.0000 mg | ORAL_TABLET | Freq: Every morning | ORAL | Status: DC
  Administered 2013-04-23 – 2013-04-26 (×3): 10 mg via ORAL
  Filled 2013-04-22 (×4): qty 1

## 2013-04-22 MED ORDER — LIP MEDEX EX OINT
1.0000 "application " | TOPICAL_OINTMENT | Freq: Two times a day (BID) | CUTANEOUS | Status: DC
  Filled 2013-04-22: qty 7

## 2013-04-22 MED ORDER — METOCLOPRAMIDE HCL 10 MG PO TABS: 10.0000 mg | ORAL_TABLET | Freq: Four times a day (QID) | ORAL | Status: DC | PRN

## 2013-04-22 MED ORDER — DOCUSATE SODIUM 100 MG PO CAPS
100.0000 mg | ORAL_CAPSULE | Freq: Two times a day (BID) | ORAL | Status: DC
  Administered 2013-04-22 – 2013-04-23 (×3): 100 mg via ORAL
  Filled 2013-04-22 (×3): qty 1

## 2013-04-22 MED ORDER — GI COCKTAIL ~~LOC~~
30.0000 mL | Freq: Once | ORAL | Status: AC
  Administered 2013-04-22: 30 mL via ORAL
  Filled 2013-04-22: qty 30

## 2013-04-22 MED ORDER — TIOTROPIUM BROMIDE MONOHYDRATE 18 MCG IN CAPS
18.0000 ug | ORAL_CAPSULE | Freq: Every day | RESPIRATORY_TRACT | Status: DC
  Administered 2013-04-23 – 2013-04-26 (×3): 18 ug via RESPIRATORY_TRACT
  Filled 2013-04-22 (×3): qty 5

## 2013-04-22 MED ORDER — LACTATED RINGERS IV BOLUS (SEPSIS)
1000.0000 mL | Freq: Once | INTRAVENOUS | Status: AC
  Administered 2013-04-22: 1000 mL via INTRAVENOUS

## 2013-04-22 MED ORDER — LACTATED RINGERS IV BOLUS (SEPSIS): 1000.0000 mL | Freq: Three times a day (TID) | INTRAVENOUS | Status: AC | PRN

## 2013-04-22 MED ORDER — HYDROMORPHONE HCL PF 1 MG/ML IJ SOLN
0.5000 mg | INTRAMUSCULAR | Status: DC | PRN
  Administered 2013-04-22: 1 mg via INTRAVENOUS
  Filled 2013-04-22: qty 1

## 2013-04-22 MED ORDER — SACCHAROMYCES BOULARDII 250 MG PO CAPS
250.0000 mg | ORAL_CAPSULE | Freq: Two times a day (BID) | ORAL | Status: DC
  Administered 2013-04-22 – 2013-04-26 (×6): 250 mg via ORAL
  Filled 2013-04-22 (×9): qty 1

## 2013-04-22 MED ORDER — SIMVASTATIN 40 MG PO TABS: 40.0000 mg | ORAL_TABLET | Freq: Every day | ORAL | Status: DC

## 2013-04-22 MED ORDER — OXYCODONE HCL 5 MG PO TABS: 5.0000 mg | ORAL_TABLET | ORAL | Status: DC | PRN

## 2013-04-22 MED ORDER — LOSARTAN POTASSIUM 50 MG PO TABS: 100.0000 mg | ORAL_TABLET | Freq: Every morning | ORAL | Status: DC

## 2013-04-22 MED ORDER — BLISTEX EX OINT
TOPICAL_OINTMENT | Freq: Two times a day (BID) | CUTANEOUS | Status: DC
  Administered 2013-04-22 – 2013-04-23 (×2): via TOPICAL
  Administered 2013-04-23: 1 via TOPICAL
  Administered 2013-04-24 – 2013-04-26 (×4): via TOPICAL
  Filled 2013-04-22: qty 10

## 2013-04-22 MED ORDER — SODIUM CHLORIDE 0.9 % IV SOLN
INTRAVENOUS | Status: DC
  Administered 2013-04-22: 14:00:00 via INTRAVENOUS

## 2013-04-22 MED ORDER — ATORVASTATIN CALCIUM 20 MG PO TABS
20.0000 mg | ORAL_TABLET | Freq: Every day | ORAL | Status: DC
  Administered 2013-04-22 – 2013-04-23 (×2): 20 mg via ORAL
  Filled 2013-04-22 (×5): qty 1

## 2013-04-22 MED ORDER — POTASSIUM CHLORIDE IN NACL 20-0.9 MEQ/L-% IV SOLN
INTRAVENOUS | Status: DC
  Administered 2013-04-22 – 2013-04-25 (×6): via INTRAVENOUS
  Filled 2013-04-22 (×6): qty 1000

## 2013-04-22 MED ORDER — DIPHENHYDRAMINE HCL 12.5 MG/5ML PO ELIX: 12.5000 mg | ORAL_SOLUTION | Freq: Four times a day (QID) | ORAL | Status: DC | PRN

## 2013-04-22 MED ORDER — ONDANSETRON HCL 4 MG PO TABS
4.0000 mg | ORAL_TABLET | Freq: Three times a day (TID) | ORAL | Status: DC | PRN
  Administered 2013-04-22 – 2013-04-23 (×2): 4 mg via ORAL
  Filled 2013-04-22 (×2): qty 1

## 2013-04-22 MED ORDER — ACETAMINOPHEN 650 MG RE SUPP: 650.0000 mg | Freq: Four times a day (QID) | RECTAL | Status: DC | PRN

## 2013-04-22 MED ORDER — HEPARIN SODIUM (PORCINE) 5000 UNIT/ML IJ SOLN
5000.0000 [IU] | Freq: Three times a day (TID) | INTRAMUSCULAR | Status: DC
  Administered 2013-04-22 – 2013-04-23 (×2): 5000 [IU] via SUBCUTANEOUS
  Filled 2013-04-22 (×5): qty 1

## 2013-04-22 MED ORDER — PROMETHAZINE HCL 25 MG PO TABS
12.5000 mg | ORAL_TABLET | Freq: Four times a day (QID) | ORAL | Status: DC | PRN
  Filled 2013-04-22: qty 1

## 2013-04-22 NOTE — Progress Notes (Signed)
Arrived to 6n02 from ED, A/Ox4, ambulates with walker, husband at bedside, oriented to room and surroundings, denies nausea/pain at this time.

## 2013-04-22 NOTE — Telephone Encounter (Signed)
Called to request for the manometry results to be faxed to [place] before the pt's surgery on 12/30 by Dr Michaell Cowing. Bonita Quin will fax the report.

## 2013-04-22 NOTE — ED Notes (Addendum)
Husband states pt has hx of hernia repair and the repair "Came undone" so she is scheduled to have repair redone but until then she continues to have back pain, nausea, belching and is very uncomfortable. Husband states they called her MD office for help but no one has returned their call yet

## 2013-04-22 NOTE — ED Provider Notes (Signed)
TIME SEEN: 12:30 PM  CHIEF COMPLAINT: Abdominal pain, back pain, indigestion  HPI: Patient is a 77 year old female with a history of coronary artery disease, hypertension, hyperlipidemia, severe acid reflux status post Nissen fundoplication approximately 18 months ago by Dr. Derrell Lolling who is scheduled to have repeat surgery by Dr. Tilden Dome on 05/07/13, status post cholecystectomy and appendectomy and hysterectomy who presents the emergency department with back pain and indigestion that started at 8 AM this morning when she woke up. She states she has been belching. She states this feels similar to her prior acid reflux exacerbations that she has been seen in the emergency department several times for the past, last time was 04/14/13. She has taken Zofran, Reglan, Protonix and Carafate at home without relief of symptoms. She is having nausea but no vomiting. She denies any shortness of breath. She states this is very different than her cardiac disease. No bloody stools or melena. No fevers or chills.  ROS: See HPI Constitutional: no fever  Eyes: no drainage  ENT: no runny nose   Cardiovascular:   chest pain  Resp: no SOB  GI: no vomiting GU: no dysuria Integumentary: no rash  Allergy: no hives  Musculoskeletal: no leg swelling  Neurological: no slurred speech ROS otherwise negative  PAST MEDICAL HISTORY/PAST SURGICAL HISTORY:  Past Medical History  Diagnosis Date  . CAD (coronary artery disease)     LHC 2003 with 40% pLAD, 30% mLAD, 100% D1 (moderate vessel), 100%  D2 (small vessel), 95% mid small OM2.  Pt had PTCA to D1;  D2 and OM2 small vessels and tx medically;  Last Myoview (2012): anterior infarct seen with scar, no ischemia. EF normal. Pt managed medically;  Lexiscan Myoview (12/14):  Low risk; ant scar with very mild peri-infarct ischemia; EF 55% with ant AK   . Recurrent aspiration bronchitis/pneumonia     PFTs 12/09 FVC 97%, FEV1 101%, ratio 77%, TLC 109%. No significant obstruction or  restriction.   . Hypertension   . Hyperlipidemia   . Esophageal stricture   . Diverticulosis of colon   . Gastroesophageal reflux disease with hiatal hernia   . Iron deficiency anemia   . Gastritis   . GERD (gastroesophageal reflux disease)   . Hiatal hernia   . Asthma   . Blood transfusion   . Cough   . Wheezing   . Weakness   . Trouble swallowing   . Abdominal pain   . Hiatal hernia   . Shortness of breath   . Anxiety     MEDICATIONS:  Prior to Admission medications   Medication Sig Start Date End Date Taking? Authorizing Provider  Aclidinium Bromide (TUDORZA PRESSAIR) 400 MCG/ACT AEPB Inhale 1 puff into the lungs 2 (two) times daily.   Yes Historical Provider, MD  ALPRAZolam Prudy Feeler) 0.5 MG tablet Take 0.5 mg by mouth 3 (three) times daily as needed for sleep or anxiety.    Yes Historical Provider, MD  amLODipine (NORVASC) 10 MG tablet Take 10 mg by mouth every morning.    Yes Historical Provider, MD  calcitonin, salmon, (MIACALCIN/FORTICAL) 200 UNIT/ACT nasal spray Place 1 spray into the nose every morning. She alternates nostrils every other day.   Yes Historical Provider, MD  Cholecalciferol (VITAMIN D3) 2000 UNITS TABS Take 6,000 Units by mouth daily. She takes two tablets in the morning and one tablet at bedtime.   Yes Historical Provider, MD  Cyanocobalamin (VITAMIN B 12 PO) Take 1 tablet by mouth every morning.  Yes Historical Provider, MD  docusate sodium 100 MG CAPS Take 100 mg by mouth 2 (two) times daily. 03/16/13  Yes [name] Sheets, MD  feeding supplement, RESOURCE BREEZE, (RESOURCE BREEZE) LIQD Take 1 Container by mouth daily at 3 pm. 03/16/13  Yes [name] Sheets, MD  HYDROcodone-acetaminophen (NORCO) 7.5-325 MG per tablet Take 1 tablet by mouth every 3 (three) hours as needed. 03/16/13  Yes [name] Sheets, MD  losartan (COZAAR) 100 MG tablet Take 100 mg by mouth every morning.    Yes Historical Provider, MD  metoCLOPramide (REGLAN) 10 MG tablet Take 1 tablet  (10 mg total) by mouth every 6 (six) hours as needed (Nausea). 03/16/13  Yes [name] Sheets, MD  metoprolol tartrate (LOPRESSOR) 25 MG tablet Take 12.5 mg by mouth at bedtime.   Yes Historical Provider, MD  mometasone-formoterol (DULERA) 200-5 MCG/ACT AERO Inhale 2 puffs into the lungs 2 (two) times daily. 01/25/12  Yes Nyoka Cowden, MD  Multiple Vitamin (MULTIVITAMIN WITH MINERALS) TABS Take 1 tablet by mouth every morning.    Yes Historical Provider, MD  ondansetron (ZOFRAN) 4 MG tablet Take 1 tablet (4 mg total) by mouth every 8 (eight) hours as needed for nausea or vomiting. 04/15/13  Yes Quentin Mulling, PA-C  OVER THE COUNTER MEDICATION Take 1,000 mg by mouth every other day. Coral Calcium 1000mg    Yes Historical Provider, MD  OVER THE COUNTER MEDICATION Take 400 mg by mouth 3 (three) times daily. Magnesium Carbonate 400mg    Yes Historical Provider, MD  pantoprazole (PROTONIX) 40 MG tablet Take 40 mg by mouth 2 (two) times daily.   Yes Historical Provider, MD  potassium chloride (K-DUR) 10 MEQ tablet Take 1 tablet (10 mEq total) by mouth 2 (two) times daily. 04/15/13  Yes Quentin Mulling, PA-C  pravastatin (PRAVACHOL) 40 MG tablet Take 40 mg by mouth at bedtime.    Yes Historical Provider, MD  promethazine (PHENERGAN) 12.5 MG tablet Take 1 tablet (12.5 mg total) by mouth every 6 (six) hours as needed for nausea. 03/16/13  Yes [name] Sheets, MD  senna (SENOKOT) 8.6 MG TABS tablet Take 1 tablet (8.6 mg total) by mouth daily. 03/16/13  Yes [name] Sheets, MD  sucralfate (CARAFATE) 1 G tablet Take 1 tablet (1 g total) by mouth 4 (four) times daily -  with meals and at bedtime. 04/14/13  Yes Vida Roller, MD    ALLERGIES:  Allergies  Allergen Reactions  . Aspartame Diarrhea and Nausea And Vomiting  . Daliresp [Roflumilast] Nausea And Vomiting  . Ace Inhibitors Cough    Per Dr Sherene Sires pulmonology 2013    SOCIAL HISTORY:  History  Substance Use Topics  . Smoking status: Never Smoker   .  Smokeless tobacco: Never Used  . Alcohol Use: No    FAMILY HISTORY: Family History  Problem Relation Age of Onset  . Cancer Mother     colon   . Colon cancer Mother 59  . Heart disease Father 27    heart attack    EXAM: BP 127/107  Pulse 101  Temp(Src) 98.2 F (36.8 C) (Oral)  Resp 16  SpO2 99% CONSTITUTIONAL: Alert and oriented and responds appropriately to questions. Well-appearing; well-nourished, elderly but appears uncomfortable HEAD: Normocephalic EYES: Conjunctivae clear, PERRL ENT: normal nose; no rhinorrhea; moist mucous membranes; pharynx without lesions noted NECK: Supple, no meningismus, no LAD  CARD: RRR; S1 and S2 appreciated; no murmurs, no clicks, no rubs, no gallops RESP: Normal chest excursion  without splinting or tachypnea; breath sounds clear and equal bilaterally; no wheezes, no rhonchi, no rales,  ABD/GI: Normal bowel sounds; non-distended; soft, mild tenderness to palpation of the epigastric region without rebound or guarding, no pulsatile mass BACK:  The back appears normal and is non-tender to palpation, there is no CVA tenderness EXT: Normal ROM in all joints; non-tender to palpation; no edema; normal capillary refill; no cyanosis; 2+ DP and radial pulses bilaterally    SKIN: Normal color for age and race; warm NEURO: Moves all extremities equally PSYCH: The patient's mood and manner are appropriate. Grooming and personal hygiene are appropriate.  MEDICAL DECISION MAKING: Patient here with her similar exacerbations of her severe acid reflux. She is hemodynamically stable. She states this feels different than her cardiac disease because she's not having any shortness of breath which was her typical anginal symptoms. Given her risk factors for ACS however, we'll obtain EKG and cardiac labs. Will give Zofran, GI cocktail, protonix and reassess. Patient reports that PPIs and GI cocktails in the past have resolved her symptoms.  ED PROGRESS: Patient has  improvement of her symptoms after GI cocktail, protonix, Zofran and morphine. X-ray show hiatal hernia but no other acute abnormality. Labs are unremarkable. Troponin is pending.  Will discuss with surgery on call given patient reports this has been getting progressively worse and this is now her fourth emergency department visit for the same. She also reports that she has lost 40 pounds in 2 months because she is unable to eat.   3:13 PM  Surgery has seen and will admit for symptom control and hydration. They had moved patient's surgery of to 04/29/13.    EKG Interpretation    Date/Time:  Monday April 22 2013 12:32:24 EST Ventricular Rate:  86 PR Interval:  137 QRS Duration: 91 QT Interval:  368 QTC Calculation: 440 R Axis:   19 Text Interpretation:  Sinus rhythm Low voltage, precordial leads Consider anterior infarct Borderline T abnormalities, inferior leads No significant change since last tracing Confirmed by Kensley Lares  DO, Giamarie Bueche ([id number]) on 04/22/2013 12:59:06 PM              [name] [name] Catherman, DO 04/22/13 1514

## 2013-04-22 NOTE — Telephone Encounter (Signed)
Dr. [name] [facility] is asking for the esophageal mano results on this pt. Do you remember seeing this study?

## 2013-04-22 NOTE — Telephone Encounter (Signed)
LMOM for pt or pt's husband to call me back. I have notified Dr Michaell Cowing again about the pt getting worse and Dr Michaell Cowing has gone to the surgery schedulers to see about moving pt's surgery up earlier. The problem is the OR not having any time but Dr Michaell Cowing did mention about giving up his vacation day on 12/22 so the schedulers are looking now to see what they can do for the pt.

## 2013-04-22 NOTE — Telephone Encounter (Signed)
LMOM for [name] [name] to call me back. I did receive paper work that she faxed on the pt but I need clarification about wether the pt got a manometry vs. Endoscopy. The procedure states a esophageal manometry but the reports reads a endoscopy was done by Dr [name]. I need to claify on this pt. ASAP.

## 2013-04-22 NOTE — H&P (Signed)
[name] [name] 1936-02-22  [id number].    Requesting MD: Dr. Elesa Massed Chief Complaint/Reason for Consult: Nausea, epigastric/back pain  HPI:  77 year old female with a history of CAD, HTN/HLD, severe acid reflux status post Nissen fundoplication approximately 18 months ago by Dr. Derrell Lolling who is scheduled to have repeat surgery by Dr. Michaell Cowing on 04/29/13, status post cholecystectomy and appendectomy and hysterectomy who presents [facility] with back pain, epigastric abdominal pain, and indigestion that started at 8 AM this morning after eating a few bites of oatmeal. She feels like the pain in her stomach is her "stomach ripping".  She states she has been belching frequently. She states this feels similar to her prior acid reflux exacerbations that she has been seen in the ED several times for the past, last time was 04/14/13. She was most recently admitted on 03/12/13-03/16/13.  No radicular symptoms.  She has taken Zofran, Reglan, Protonix and Carafate at home without relief of symptoms. She is having nausea, but no vomiting. She denies any shortness of breath, chest pain, palpitations.  No bloody stools or melena. No fevers, but feels chills.  Notes a "30lb weight loss" over the last few months because she has been unable to tolerate much PO even with pureed baby foods.     ROS: All systems reviewed and otherwise negative except for as above  Family History  Problem Relation Age of Onset  . Cancer Mother     colon   . Colon cancer Mother 50  . Heart disease Father 73    heart attack    Past Medical History  Diagnosis Date  . CAD (coronary artery disease)     LHC 2003 with 40% pLAD, 30% mLAD, 100% D1 (moderate vessel), 100%  D2 (small vessel), 95% mid small OM2.  Pt had PTCA to D1;  D2 and OM2 small vessels and tx medically;  Last Myoview (2012): anterior infarct seen with scar, no ischemia. EF normal. Pt managed medically;  Lexiscan Myoview (12/14):  Low risk; ant scar with very mild peri-infarct ischemia;  EF 55% with ant AK   . Recurrent aspiration bronchitis/pneumonia     PFTs 12/09 FVC 97%, FEV1 101%, ratio 77%, TLC 109%. No significant obstruction or restriction.   . Hypertension   . Hyperlipidemia   . Esophageal stricture   . Diverticulosis of colon   . Gastroesophageal reflux disease with hiatal hernia   . Iron deficiency anemia   . Gastritis   . GERD (gastroesophageal reflux disease)   . Hiatal hernia   . Asthma   . Blood transfusion   . Cough   . Wheezing   . Weakness   . Trouble swallowing   . Abdominal pain   . Hiatal hernia   . Shortness of breath   . Anxiety     Past Surgical History  Procedure Laterality Date  . Back surgery    . Cholecystectomy    . Bladder repair    . Hemorrhoid surgery    . Cholecystectomy      Patient unsure of date.  [name] Appendectomy      patient unsure of date  . Total abdominal hysterectomy  1975    partial  . Coronary angioplasty  11/16/2001  . Eye surgery  11/2000    bilateral cataracts with lens implant  . Hiatal hernia repair  06/03/2011    Procedure: LAPAROSCOPIC REPAIR OF HIATAL HERNIA;  Surgeon: Ernestene Mention, MD;  Location: [facility] ORS;  Service: General;  Laterality: N/A;  . Laparoscopic  nissen fundoplication  06/03/2011    Procedure: LAPAROSCOPIC NISSEN FUNDOPLICATION;  Surgeon: Ernestene Mention, MD;  Location: [facility];  Service: General;  Laterality: N/A;  . Lumbar laminectomy/decompression microdiscectomy Right 06/26/2012    Procedure: LUMBAR LAMINECTOMY/DECOMPRESSION MICRODISCECTOMY;  Surgeon: Kerrin Champagne, MD;  Location[facility];  Service: Orthopedics;  Laterality: Right;  RIGHT L4-5 MICRODISCECTOMY  . Esophagogastroduodenoscopy N/A 03/15/2013    Procedure: ESOPHAGOGASTRODUODENOSCOPY (EGD);  Surgeon: Beverley Fiedler, MD;  Location: [facility] END[facility]COPY;  Service: Endoscopy;  Laterality: N/A;  . Esophageal manometry N/A 03/25/2013    Procedure: ESOPHAGEAL MANOMETRY (EM);  Surgeon: Louis Meckel, MD;  Location: [facility] ENDOSCOPY;  Service:  Endoscopy;  Laterality: N/A;    Social History:  reports that she has never smoked. She has never used smokeless tobacco. She reports that she does not drink alcohol or use illicit drugs.  Allergies:  Allergies  Allergen Reactions  . Aspartame Diarrhea and Nausea And Vomiting  . Daliresp [Roflumilast] Nausea And Vomiting  . Ace Inhibitors Cough    Per Dr Sherene Sires pulmonology 2013     (Not in a hospital admission)  Blood pressure 141/82, pulse 101, temperature 98.5 F (36.9 C), temperature source Oral, resp. rate 28, SpO2 100.00%. Physical Exam: General: pleasant, WD/WN white female who is laying in bed in some mild discomfort due to pain HEENT: head is normocephalic, atraumatic.  Sclera are noninjected.  PERRL.  Ears and nose without any masses or lesions.  Mouth is pink and moist Heart: regular, rate, and rhythm.  No obvious murmurs, gallops, or rubs noted.  Palpable pedal pulses bilaterally Lungs: CTAB, no wheezes, rhonchi, or rales noted.  Respiratory effort nonlabored Abd: soft, ND, moderate tenderness in the epigastrium, +BS, no masses, or organomegaly; known hiatal hernia, multiple small well healed laparoscopic incision sites across the abdomen, horizontal open well healed appendectomy scar MS: all 4 extremities are symmetrical with no cyanosis, clubbing, or edema. Skin: warm and dry with no masses, lesions, or rashes Psych: A&Ox3 with an appropriate affect, emotional  Results for orders placed during the hospital encounter of 04/22/13 (from the past 48 hour(s))  COMPREHENSIVE METABOLIC PANEL     Status: Abnormal   Collection Time    04/22/13 12:26 PM      Result Value Range   Sodium 142  135 - 145 mEq/L   Potassium 3.4 (*) 3.5 - 5.1 mEq/L   Chloride 103  96 - 112 mEq/L   CO2 21  19 - 32 mEq/L   Glucose, Bld 89  70 - 99 mg/dL   BUN 11  6 - 23 mg/dL   Creatinine, Ser 1.61  0.50 - 1.10 mg/dL   Calcium 9.9  8.4 - 09.6 mg/dL   Total Protein 7.5  6.0 - 8.3 g/dL   Albumin 4.3   3.5 - 5.2 g/dL   AST 15  0 - 37 U/L   ALT 12  0 - 35 U/L   Alkaline Phosphatase 62  39 - 117 U/L   Total Bilirubin 0.6  0.3 - 1.2 mg/dL   GFR calc non Af Amer 62 (*) >90 mL/min   GFR calc Af Amer 72 (*) >90 mL/min   Comment: (NOTE)     The eGFR has been calculated using the CKD EPI equation.     This calculation has not been validated in all clinical situations.     eGFR's persistently <90 mL/min signify possible Chronic Kidney     Disease.  LIPASE, BLOOD  Status: None   Collection Time    04/22/13 12:26 PM      Result Value Range   Lipase 21  11 - 59 U/L  CBC WITH DIFFERENTIAL     Status: Abnormal   Collection Time    04/22/13 12:26 PM      Result Value Range   WBC 6.7  4.0 - 10.5 K/uL   RBC 4.48  3.87 - 5.11 MIL/uL   Hemoglobin 13.5  12.0 - 15.0 g/dL   HCT 25.3  66.4 - 40.3 %   MCV 87.7  78.0 - 100.0 fL   MCH 30.1  26.0 - 34.0 pg   MCHC 34.4  30.0 - 36.0 g/dL   RDW 47.4 (*) 25.9 - 56.3 %   Platelets 344  150 - 400 K/uL   Neutrophils Relative % 64  43 - 77 %   Neutro Abs 4.3  1.7 - 7.7 K/uL   Lymphocytes Relative 25  12 - 46 %   Lymphs Abs 1.7  0.7 - 4.0 K/uL   Monocytes Relative 9  3 - 12 %   Monocytes Absolute 0.6  0.1 - 1.0 K/uL   Eosinophils Relative 2  0 - 5 %   Eosinophils Absolute 0.1  0.0 - 0.7 K/uL   Basophils Relative 1  0 - 1 %   Basophils Absolute 0.0  0.0 - 0.1 K/uL  POCT I-STAT TROPONIN I     Status: None   Collection Time    04/22/13  2:04 PM      Result Value Range   Troponin i, poc 0.01  0.00 - 0.08 ng/mL   Comment 3            Comment: Due to the release kinetics of cTnI,     a negative result within the first hours     of the onset of symptoms does not rule out     myocardial infarction with certainty.     If myocardial infarction is still suspected,     repeat the test at appropriate intervals.  URINALYSIS, ROUTINE W REFLEX MICROSCOPIC     Status: Abnormal   Collection Time    04/22/13  2:22 PM      Result Value Range   Color, Urine  YELLOW  YELLOW   APPearance CLEAR  CLEAR   Specific Gravity, Urine 1.013  1.005 - 1.030   pH 8.5 (*) 5.0 - 8.0   Glucose, UA NEGATIVE  NEGATIVE mg/dL   Hgb urine dipstick NEGATIVE  NEGATIVE   Bilirubin Urine NEGATIVE  NEGATIVE   Ketones, ur 15 (*) NEGATIVE mg/dL   Protein, ur NEGATIVE  NEGATIVE mg/dL   Urobilinogen, UA 0.2  0.0 - 1.0 mg/dL   Nitrite NEGATIVE  NEGATIVE   Leukocytes, UA NEGATIVE  NEGATIVE   Comment: MICROSCOPIC NOT DONE ON URINES WITH NEGATIVE PROTEIN, BLOOD, LEUKOCYTES, NITRITE, OR GLUCOSE <1000 mg/dL.   Dg Abd Acute W/chest  04/22/2013   CLINICAL DATA:  Upper abdominal pain  EXAM: ACUTE ABDOMEN SERIES (ABDOMEN 2 VIEW & CHEST 1 VIEW)  COMPARISON:  03/12/2013  FINDINGS: Cardiomediastinal silhouette is stable. Moderate size hiatal hernia. No acute infiltrate or pleural effusion. No pulmonary edema.  There is nonspecific nonobstructive bowel gas pattern. Postcholecystectomy surgical clips are noted. No free abdominal air.  IMPRESSION: No acute disease. Moderate size hiatal hernia. Nonspecific nonobstructive bowel gas pattern.   Electronically Signed   By: Natasha Mead M.D.   On: 04/22/2013 13:22  Assessment/Plan Recurrent paraesophageal hiatal hernia Slipped wrap fundoplication Nausea  Epigastric abdominal pain  Back pain Hypokalemia - 3.4   Plan: 1.  Admit her for hydration, pain/symptoms control 2.  Work to re-advance her diet.   Hopefully, in a day of two, with hydration she will improve and we will be able to discharge home for surgery on Monday (22nd).  We were able to move the date up from the 30th to the 22nd.  She has her pre-op clearance on the 17th.   3.  IVF, pain control, antiemetics 4.  Will notify Dr. Michaell Cowing of the patient's admission 5.  Supplement Mordecai Rasmussen, Halie Gass 04/22/2013, 3:19 PM Pager: [phone]

## 2013-04-22 NOTE — Telephone Encounter (Signed)
Patient's husband has called back again stating patient is having increasing pain and increased belching.  Husband states his wife can't wait another 2 weeks for surgery.  Explained to husband again that I know the surgery schedulers have looked and unable to find any time before 05/07/13.  Husband had called last week 04/18/13 and I tried to explain that if he felt things were that bad and this was an emergency then he would need to take patient to the ED however there are no notes that husband has taken patient.  Explained to husband that I will send a message to Dr. Michaell Cowing to ask his opinion and if he knows of something to offer to the patient then we will let them know.  Husband states understanding at this time.

## 2013-04-22 NOTE — Telephone Encounter (Signed)
[name] [name] just called me back to let me know that she is still working on this report that she had to call endoscopy to see about the info but they have not returned her call. [name] [name] will call me back once she knows more.

## 2013-04-22 NOTE — Telephone Encounter (Signed)
LMOM for [name] [name] again to see about the report that was faxed over on the pt that Dr [name] did on the pt for 03/25/13.

## 2013-04-22 NOTE — H&P (Signed)
Seen together. I also spoke to Dr. Michaell Cowing regarding our plan. Patient examined and I agree with the assessment and plan  Violeta Gelinas, MD, MPH, FACS Pager: [phone]  04/22/2013 3:56 PM

## 2013-04-23 ENCOUNTER — Encounter (HOSPITAL_COMMUNITY): Payer: Self-pay | Admitting: General Practice

## 2013-04-23 DIAGNOSIS — K2990 Gastroduodenitis, unspecified, without bleeding: Secondary | ICD-10-CM

## 2013-04-23 DIAGNOSIS — K2970 Gastritis, unspecified, without bleeding: Secondary | ICD-10-CM

## 2013-04-23 LAB — BASIC METABOLIC PANEL
BUN: 9 mg/dL (ref 6–23)
Calcium: 8.8 mg/dL (ref 8.4–10.5)
GFR calc non Af Amer: 66 mL/min — ABNORMAL LOW (ref >90)
Glucose, Bld: 81 mg/dL (ref 70–99)
Potassium: 4.1 mEq/L (ref 3.5–5.1)

## 2013-04-23 LAB — SURGICAL PCR SCREEN: MRSA, PCR: NEGATIVE

## 2013-04-23 MED ORDER — METRONIDAZOLE IN NACL 5-0.79 MG/ML-% IV SOLN
500.0000 mg | INTRAVENOUS | Status: AC
  Administered 2013-04-24: 500 mg via INTRAVENOUS

## 2013-04-23 MED ORDER — ZOLPIDEM TARTRATE 5 MG PO TABS: 5.0000 mg | ORAL_TABLET | Freq: Every evening | ORAL | Status: DC | PRN

## 2013-04-23 MED ORDER — METRONIDAZOLE IN NACL 5-0.79 MG/ML-% IV SOLN
500.0000 mg | INTRAVENOUS | Status: DC
  Filled 2013-04-23: qty 100

## 2013-04-23 MED ORDER — CEFAZOLIN SODIUM-DEXTROSE 2-3 GM-% IV SOLR
2.0000 g | INTRAVENOUS | Status: AC
  Administered 2013-04-24: 2 g via INTRAVENOUS

## 2013-04-23 MED ORDER — CHLORHEXIDINE GLUCONATE 4 % EX LIQD: 1.0000 "application " | Freq: Once | CUTANEOUS | Status: DC

## 2013-04-23 MED ORDER — METRONIDAZOLE IN NACL 5-0.79 MG/ML-% IV SOLN: 500.0000 mg | INTRAVENOUS | Status: AC

## 2013-04-23 MED ORDER — LORAZEPAM 2 MG/ML IJ SOLN: 0.5000 mg | Freq: Three times a day (TID) | INTRAMUSCULAR | Status: DC | PRN

## 2013-04-23 MED ORDER — HYDROMORPHONE HCL PF 1 MG/ML IJ SOLN
0.5000 mg | INTRAMUSCULAR | Status: DC | PRN
  Administered 2013-04-24 – 2013-04-25 (×3): 1 mg via INTRAVENOUS
  Filled 2013-04-23 (×4): qty 1

## 2013-04-23 MED ORDER — CHLORHEXIDINE GLUCONATE 4 % EX LIQD
1.0000 "application " | Freq: Once | CUTANEOUS | Status: DC
  Filled 2013-04-23: qty 15

## 2013-04-23 MED ORDER — CEFAZOLIN SODIUM-DEXTROSE 2-3 GM-% IV SOLR
2.0000 g | INTRAVENOUS | Status: DC
  Filled 2013-04-23: qty 50

## 2013-04-23 MED ORDER — METOCLOPRAMIDE HCL 5 MG/ML IJ SOLN
5.0000 mg | Freq: Four times a day (QID) | INTRAMUSCULAR | Status: DC | PRN
  Administered 2013-04-24: 5 mg via INTRAVENOUS
  Administered 2013-04-25: 10 mg via INTRAVENOUS
  Filled 2013-04-23 (×2): qty 2

## 2013-04-23 MED ORDER — LOSARTAN POTASSIUM 50 MG PO TABS
100.0000 mg | ORAL_TABLET | Freq: Every morning | ORAL | Status: DC
  Administered 2013-04-25 – 2013-04-26 (×2): 100 mg via ORAL
  Filled 2013-04-23 (×3): qty 2

## 2013-04-23 MED ORDER — CHLORHEXIDINE GLUCONATE 4 % EX LIQD
1.0000 "application " | Freq: Once | CUTANEOUS | Status: AC
  Administered 2013-04-24: 1 via TOPICAL
  Filled 2013-04-23: qty 15

## 2013-04-23 MED ORDER — HEPARIN SODIUM (PORCINE) 5000 UNIT/ML IJ SOLN
5000.0000 [IU] | Freq: Three times a day (TID) | INTRAMUSCULAR | Status: DC
  Administered 2013-04-23 – 2013-04-26 (×6): 5000 [IU] via SUBCUTANEOUS
  Filled 2013-04-23 (×11): qty 1

## 2013-04-23 MED ORDER — LEVALBUTEROL HCL 1.25 MG/0.5ML IN NEBU
1.2500 mg | INHALATION_SOLUTION | Freq: Four times a day (QID) | RESPIRATORY_TRACT | Status: DC | PRN
  Filled 2013-04-23: qty 0.5

## 2013-04-23 MED ORDER — CHLORHEXIDINE GLUCONATE 4 % EX LIQD
1.0000 "application " | Freq: Once | CUTANEOUS | Status: AC
  Administered 2013-04-23: 1 via TOPICAL
  Filled 2013-04-23: qty 15

## 2013-04-23 NOTE — Telephone Encounter (Signed)
Report completed Emailed to Dr. [name], Selinda Michaels, RN and [facility] RN, Anthony Sar

## 2013-04-23 NOTE — Telephone Encounter (Signed)
Dr. Rhea Belton have you seen an esophageal manometry on this pt?

## 2013-04-23 NOTE — Progress Notes (Signed)
UR completed 

## 2013-04-23 NOTE — Progress Notes (Deleted)
Subjective: Complains of some nausea, soreness and discomfort.  She says she is a little better than yesterday.  Objective: Vital signs in last 24 hours: Temp:  [97.4 F (36.3 C)-98.8 F (37.1 C)] 97.4 F (36.3 C) (12/16 0936) Pulse Rate:  [70-101] 74 (12/16 0936) Resp:  [14-28] 18 (12/16 0936) BP: (127-162)/(73-107) 130/99 mmHg (12/16 0936) SpO2:  [95 %-100 %] 99 % (12/16 0936)  Nothing PO recorded. Afebrile, BP up some Labs OK Just had something for nausea, on clears. Intake/Output from previous day: 12/15 0701 - 12/16 0700 In: 1000 [IV Piggyback:1000] Out: 700 [Urine:700] Intake/Output this shift: Total I/O In: 1212.5 [I.V.:1212.5] Out: 350 [Urine:350]  General appearance: alert, cooperative and no distress Resp: clear to auscultation bilaterally GI: soft, sore lower abdomen.  few BS, last BM yesterday.  Lab Results:   Recent Labs  04/22/13 1226 04/22/13 1535  WBC 6.7 5.0  HGB 13.5 13.0  HCT 39.3 38.2  PLT 344 348    BMET  Recent Labs  04/22/13 1226 04/22/13 1535 04/23/13 0542  NA 142  --  140  K 3.4*  --  4.1  CL 103  --  108  CO2 21  --  22  GLUCOSE 89  --  81  BUN 11  --  9  CREATININE 0.89 0.86 0.84  CALCIUM 9.9  --  8.8   PT/INR No results found for this basename: LABPROT, INR,  in the last 72 hours   Recent Labs Lab 04/22/13 1226  AST 15  ALT 12  ALKPHOS 62  BILITOT 0.6  PROT 7.5  ALBUMIN 4.3     Lipase     Component Value Date/Time   LIPASE 21 04/22/2013 1226     Studies/Results: Dg Abd Acute W/chest  04/22/2013   CLINICAL DATA:  Upper abdominal pain  EXAM: ACUTE ABDOMEN SERIES (ABDOMEN 2 VIEW & CHEST 1 VIEW)  COMPARISON:  03/12/2013  FINDINGS: Cardiomediastinal silhouette is stable. Moderate size hiatal hernia. No acute infiltrate or pleural effusion. No pulmonary edema.  There is nonspecific nonobstructive bowel gas pattern. Postcholecystectomy surgical clips are noted. No free abdominal air.  IMPRESSION: No acute  disease. Moderate size hiatal hernia. Nonspecific nonobstructive bowel gas pattern.   Electronically Signed   By: Natasha Mead M.D.   On: 04/22/2013 13:22    Medications: . amLODipine  10 mg Oral q morning - 10a  . atorvastatin  20 mg Oral q1800  . calcitonin (salmon)  1 spray Alternating Nares q morning - 10a  .  ceFAZolin (ANCEF) IV  2 g Intravenous 60 min Pre-Op  . chlorhexidine  1 application Topical Once  . [START ON 04/24/2013] chlorhexidine  1 application Topical Once  . cholecalciferol  6,000 Units Oral Daily  . docusate sodium  100 mg Oral BID  . fluconazole  200 mg Oral Daily  . heparin  5,000 Units Subcutaneous Q8H  . lip balm   Topical BID  . [START ON 04/24/2013] losartan  100 mg Oral q morning - 10a  . metoprolol tartrate  12.5 mg Oral QHS  . metronidazole  500 mg Intravenous On Call to OR  . mometasone-formoterol  2 puff Inhalation BID  . multivitamin with minerals  1 tablet Oral q morning - 10a  . pantoprazole (PROTONIX) IV  40 mg Intravenous Q12H  . potassium chloride  10 mEq Oral BID  . saccharomyces boulardii  250 mg Oral BID  . senna  1 tablet Oral Daily  . sucralfate  1 g  Oral TID WC & HS  . tiotropium  18 mcg Inhalation Daily    Assessment/Plan Recurrent paraesophageal hiatal hernia/GERD/Hx of gastritis/hx of dysphagia/esphageal stricture Slipped wrap fundoplication  Nausea  Epigastric abdominal pain  Back pain  Hypokalemia - 3.4 Hx of CAD, cleared by Cardiology for surgery Hypertension Anemia Dyslipidemia    Plan:  Clear liquids and hydrate.      LOS: 1 day    [name] [name] 04/23/2013

## 2013-04-23 NOTE — Progress Notes (Signed)
Patient transferred to [facility] via [facility]. Medicated prior to transport with p.o. pain med.

## 2013-04-23 NOTE — Telephone Encounter (Signed)
I do not recall seeing it.  Check with Dr. Rhea Belton

## 2013-04-23 NOTE — Progress Notes (Signed)
Report called to [facility] RN

## 2013-04-23 NOTE — Progress Notes (Signed)
Sharyl Panchal [id number] 20-Aug-1935  CARE TEAM:  PCP: [name], MD  Outpatient Care Team: Patient Care Team: Lucky Cowboy, MD as PCP - General (Internal Medicine) Louis Meckel, MD as Consulting Physician (Gastroenterology) Oretha Milch, MD as Consulting Physician (Pulmonary Disease) Laurey Morale, MD as Consulting Physician (Cardiology) Ardeth Sportsman, MD as Consulting Physician (General Surgery) Kerrin Champagne, MD as Consulting Physician (Orthopedic Surgery)  Inpatient Treatment Team: Treatment Team: Attending Provider: Bishop Limbo, MD; Rounding Team: Md Montez Morita, MD; Consulting Physician: Ardeth Sportsman, MD; Technician: Imelda Pillow, NT; Registered Nurse: Royden Purl, RN; Registered Nurse: Dennard Schaumann Pleasant, RN   Subjective:  Tired but feels a little better Walking in room Trying sips only. No nausea/retching.  Pain less  Objective:  Vital signs:  Filed Vitals:   04/22/13 1655 04/22/13 2104 04/23/13 0122 04/23/13 0533  BP: 161/77 162/77 146/74 151/84  Pulse: 92 80 70 74  Temp: 98.2 F (36.8 C) 98.3 F (36.8 C) 97.9 F (36.6 C) 97.8 F (36.6 C)  TempSrc: Oral Oral Oral Oral  Resp: 18 18 18 18   SpO2: 97% 95% 97% 99%       Intake/Output   Yesterday:  12/15 0701 - 12/16 0700 In: 1000 [IV Piggyback:1000] Out: 700 [Urine:700] This shift:  Total I/O In: -  Out: 500 [Urine:500]  Bowel function:  Flatus: y  BM: n  Drain: n/a  Physical Exam:  General: Pt awakens to be alert/oriented x4 in no acute distress Eyes: PERRL, normal EOM.  Sclera clear.  No icterus Neuro: CN II-XII intact w/o focal sensory/motor deficits. Lymph: No head/neck/groin lymphadenopathy Psych:  No delerium/psychosis/paranoia HENT: Normocephalic, Mucus membranes moist.  No thrush Neck: Supple, No tracheal deviation Chest: No chest wall pain w good excursion CV:  Pulses intact.  Regular rhythm MS: Normal AROM mjr joints.  No obvious deformity Abdomen: Soft.   Nondistended but mod fullness supraumbilical.  Nontender.  No evidence of peritonitis.  No incarcerated hernias. Ext:  SCDs BLE.  No mjr edema.  No cyanosis Skin: No petechiae / purpura   Problem List:   Active Problems:   HYPERTENSION   ASTHMA   Incarcerated recurrent paraesophageal hiatal hernia   GASTRITIS   Chest pain   Constipation, chronic   Anxiety   Nausea and vomiting   Assessment  Joie Hipps  77 y.o. female       Incarcerated paraesophageal hiatal hernia with worsening dysphagia, nausea, chest pain  Plan:  Retry oral liquids.  Advanced to period.  If tolerates, transition to home and set up elective repair early next week.  And cannot tolerate this, will try and do repair of this admission.  Difficult with end of year overbooked operating rooms, but her body cannot wait much longer.  I did discuss this with her.  I do think that a laparoscopic approach is feasible with an increased risk of conversion/esophageal or gastric injury:  The anatomy & physiology of the foregut and anti-reflux mechanism was discussed.  The pathophysiology of hiatal herniation and GERD was discussed.  Natural history risks without surgery was discussed.   The patient's symptoms are not adequately controlled by medicines and other non-operative treatments.  I feel the risks of no intervention will lead to serious problems that outweigh the operative risks; therefore, I recommended surgery to reduce the hiatal hernia out of the chest and fundoplication to rebuild the anti-reflux valve and control reflux better.  Need for a thorough workup to rule out the  differential diagnosis and plan treatment was explained.  I explained laparoscopic techniques with possible need for an open approach.  Risks such as bleeding, infection, abscess, leak, need for further treatment, heart attack, death, and other risks were discussed.   I noted a good likelihood this will help address the problem.  Goals of  post-operative recovery were discussed as well.  Possibility that this will not correct all symptoms was explained.  Post-operative dysphagia, need for short-term liquid & pureed diet, inability to vomit, possibility of reherniation, possible need for medicines to help control symptoms in addition to surgery were discussed.  We will work to minimize complications.   Educational handouts further explaining the pathology, treatment options, and dysphagia diet was given as well.  Questions were answered.  The patient expresses understanding & wishes to proceed with surgery.   -VTE prophylaxis- SCDs, etc  -mobilize as tolerated to help recovery  Ardeth Sportsman, M.D., F.A.C.S. Gastrointestinal and Minimally Invasive Surgery [facility] [address], [address] [place], [place] [address] [phone] Main / Paging   04/23/2013   Results:   Labs: Results for orders placed during the hospital encounter of 04/22/13 (from the past 48 hour(s))  COMPREHENSIVE METABOLIC PANEL     Status: Abnormal   Collection Time    04/22/13 12:26 PM      Result Value Range   Sodium 142  135 - 145 mEq/L   Potassium 3.4 (*) 3.5 - 5.1 mEq/L   Chloride 103  96 - 112 mEq/L   CO2 21  19 - 32 mEq/L   Glucose, Bld 89  70 - 99 mg/dL   BUN 11  6 - 23 mg/dL   Creatinine, Ser 8.65  0.50 - 1.10 mg/dL   Calcium 9.9  8.4 - 78.4 mg/dL   Total Protein 7.5  6.0 - 8.3 g/dL   Albumin 4.3  3.5 - 5.2 g/dL   AST 15  0 - 37 U/L   ALT 12  0 - 35 U/L   Alkaline Phosphatase 62  39 - 117 U/L   Total Bilirubin 0.6  0.3 - 1.2 mg/dL   GFR calc non Af Amer 62 (*) >90 mL/min   GFR calc Af Amer 72 (*) >90 mL/min   Comment: (NOTE)     The eGFR has been calculated using the CKD EPI equation.     This calculation has not been validated in all clinical situations.     eGFR's persistently <90 mL/min signify possible Chronic Kidney     Disease.  LIPASE, BLOOD     Status: None   Collection Time    04/22/13  12:26 PM      Result Value Range   Lipase 21  11 - 59 U/L  CBC WITH DIFFERENTIAL     Status: Abnormal   Collection Time    04/22/13 12:26 PM      Result Value Range   WBC 6.7  4.0 - 10.5 K/uL   RBC 4.48  3.87 - 5.11 MIL/uL   Hemoglobin 13.5  12.0 - 15.0 g/dL   HCT 69.6  29.5 - 28.4 %   MCV 87.7  78.0 - 100.0 fL   MCH 30.1  26.0 - 34.0 pg   MCHC 34.4  30.0 - 36.0 g/dL   RDW 13.2 (*) 44.0 - 10.2 %   Platelets 344  150 - 400 K/uL   Neutrophils Relative % 64  43 - 77 %   Neutro Abs 4.3  1.7 - 7.7 K/uL   Lymphocytes Relative 25  12 - 46 %   Lymphs Abs 1.7  0.7 - 4.0 K/uL   Monocytes Relative 9  3 - 12 %   Monocytes Absolute 0.6  0.1 - 1.0 K/uL   Eosinophils Relative 2  0 - 5 %   Eosinophils Absolute 0.1  0.0 - 0.7 K/uL   Basophils Relative 1  0 - 1 %   Basophils Absolute 0.0  0.0 - 0.1 K/uL  POCT I-STAT TROPONIN I     Status: None   Collection Time    04/22/13  2:04 PM      Result Value Range   Troponin i, poc 0.01  0.00 - 0.08 ng/mL   Comment 3            Comment: Due to the release kinetics of cTnI,     a negative result within the first hours     of the onset of symptoms does not rule out     myocardial infarction with certainty.     If myocardial infarction is still suspected,     repeat the test at appropriate intervals.  URINALYSIS, ROUTINE W REFLEX MICROSCOPIC     Status: Abnormal   Collection Time    04/22/13  2:22 PM      Result Value Range   Color, Urine YELLOW  YELLOW   APPearance CLEAR  CLEAR   Specific Gravity, Urine 1.013  1.005 - 1.030   pH 8.5 (*) 5.0 - 8.0   Glucose, UA NEGATIVE  NEGATIVE mg/dL   Hgb urine dipstick NEGATIVE  NEGATIVE   Bilirubin Urine NEGATIVE  NEGATIVE   Ketones, ur 15 (*) NEGATIVE mg/dL   Protein, ur NEGATIVE  NEGATIVE mg/dL   Urobilinogen, UA 0.2  0.0 - 1.0 mg/dL   Nitrite NEGATIVE  NEGATIVE   Leukocytes, UA NEGATIVE  NEGATIVE   Comment: MICROSCOPIC NOT DONE ON URINES WITH NEGATIVE PROTEIN, BLOOD, LEUKOCYTES, NITRITE, OR GLUCOSE  <1000 mg/dL.  CBC     Status: Abnormal   Collection Time    04/22/13  3:35 PM      Result Value Range   WBC 5.0  4.0 - 10.5 K/uL   RBC 4.34  3.87 - 5.11 MIL/uL   Hemoglobin 13.0  12.0 - 15.0 g/dL   HCT 40.9  81.1 - 91.4 %   MCV 88.0  78.0 - 100.0 fL   MCH 30.0  26.0 - 34.0 pg   MCHC 34.0  30.0 - 36.0 g/dL   RDW 78.2 (*) 95.6 - 21.3 %   Platelets 348  150 - 400 K/uL  CREATININE, SERUM     Status: Abnormal   Collection Time    04/22/13  3:35 PM      Result Value Range   Creatinine, Ser 0.86  0.50 - 1.10 mg/dL   GFR calc non Af Amer 64 (*) >90 mL/min   GFR calc Af Amer 75 (*) >90 mL/min   Comment: (NOTE)     The eGFR has been calculated using the CKD EPI equation.     This calculation has not been validated in all clinical situations.     eGFR's persistently <90 mL/min signify possible Chronic Kidney     Disease.    Imaging / Studies: Dg Abd Acute W/chest  04/22/2013   CLINICAL DATA:  Upper abdominal pain  EXAM: ACUTE ABDOMEN SERIES (ABDOMEN 2 VIEW & CHEST 1 VIEW)  COMPARISON:  03/12/2013  FINDINGS: Cardiomediastinal silhouette is  stable. Moderate size hiatal hernia. No acute infiltrate or pleural effusion. No pulmonary edema.  There is nonspecific nonobstructive bowel gas pattern. Postcholecystectomy surgical clips are noted. No free abdominal air.  IMPRESSION: No acute disease. Moderate size hiatal hernia. Nonspecific nonobstructive bowel gas pattern.   Electronically Signed   By: Natasha Mead M.D.   On: 04/22/2013 13:22    Medications / Allergies: per chart  Antibiotics: Anti-infectives   Start     Dose/Rate Route Frequency Ordered Stop   04/22/13 1800  fluconazole (DIFLUCAN) tablet 200 mg     200 mg Oral Daily 04/22/13 1749

## 2013-04-23 NOTE — Progress Notes (Addendum)
Patient with poor by mouth intake and recurrent nausea/chest pain.  Will perform laparoscopic redo paraesophageal hiatal hernia repair and fundoplication.  Will transfer patient to [facility] for advanced laparoscopic procedure.  There is availability tomorrow morning.  No availability in the operating room at [facility] anyway.  This is not a situation to do after hours.  I have no other time until next week.  I do not think she can wait that long.  She wishes to be aggressive and do it as soon as possible.  Orders are written.  Discuss with patient's nurse, Alona Bene, as well.

## 2013-04-24 ENCOUNTER — Encounter (HOSPITAL_COMMUNITY)
Admission: EM | Disposition: A | Discharge status: Home / Self Care | Payer: Self-pay | Source: Home / Self Care | Attending: Surgery

## 2013-04-24 ENCOUNTER — Encounter (HOSPITAL_COMMUNITY): Payer: Self-pay | Admitting: Certified Registered Nurse Anesthetist

## 2013-04-24 ENCOUNTER — Inpatient Hospital Stay (HOSPITAL_COMMUNITY): Payer: Medicare Other | Admitting: Anesthesiology

## 2013-04-24 ENCOUNTER — Inpatient Hospital Stay (HOSPITAL_COMMUNITY): Admission: RE | Admit: 2013-04-24 | Payer: Medicare Other | Source: Ambulatory Visit

## 2013-04-24 ENCOUNTER — Ambulatory Visit (HOSPITAL_COMMUNITY): Admission: RE | Admit: 2013-04-24 | Payer: Medicare Other | Source: Ambulatory Visit | Admitting: Surgery

## 2013-04-24 ENCOUNTER — Encounter (HOSPITAL_COMMUNITY): Payer: Medicare Other | Admitting: Anesthesiology

## 2013-04-24 DIAGNOSIS — K660 Peritoneal adhesions (postprocedural) (postinfection): Secondary | ICD-10-CM

## 2013-04-24 HISTORY — PX: HIATAL HERNIA REPAIR: SHX195

## 2013-04-24 HISTORY — PX: LAPAROSCOPIC LYSIS OF ADHESIONS: SHX5905

## 2013-04-24 HISTORY — PX: INSERTION OF MESH: SHX5868

## 2013-04-24 SURGERY — REPAIR, HERNIA, HIATAL, LAPAROSCOPIC
Anesthesia: General

## 2013-04-24 MED ORDER — HYDROMORPHONE HCL PF 1 MG/ML IJ SOLN
INTRAMUSCULAR | Status: AC
  Filled 2013-04-24: qty 1

## 2013-04-24 MED ORDER — FENTANYL CITRATE 0.05 MG/ML IJ SOLN
INTRAMUSCULAR | Status: DC | PRN
  Administered 2013-04-24: 100 ug via INTRAVENOUS
  Administered 2013-04-24 (×3): 50 ug via INTRAVENOUS

## 2013-04-24 MED ORDER — BUPIVACAINE-EPINEPHRINE 0.25% -1:200000 IJ SOLN
INTRAMUSCULAR | Status: AC
  Filled 2013-04-24: qty 1

## 2013-04-24 MED ORDER — PHENYLEPHRINE HCL 10 MG/ML IJ SOLN
INTRAMUSCULAR | Status: AC
  Filled 2013-04-24: qty 1

## 2013-04-24 MED ORDER — BUPIVACAINE-EPINEPHRINE 0.25% -1:200000 IJ SOLN
INTRAMUSCULAR | Status: DC | PRN
  Administered 2013-04-24: 50 mL

## 2013-04-24 MED ORDER — GLYCOPYRROLATE 0.2 MG/ML IJ SOLN
INTRAMUSCULAR | Status: DC | PRN
  Administered 2013-04-24: .8 mg via INTRAVENOUS

## 2013-04-24 MED ORDER — METRONIDAZOLE IN NACL 5-0.79 MG/ML-% IV SOLN
INTRAVENOUS | Status: AC
  Filled 2013-04-24: qty 100

## 2013-04-24 MED ORDER — KETOROLAC TROMETHAMINE 30 MG/ML IJ SOLN: INTRAMUSCULAR | Status: DC | PRN

## 2013-04-24 MED ORDER — FENTANYL CITRATE 0.05 MG/ML IJ SOLN
INTRAMUSCULAR | Status: AC
  Filled 2013-04-24: qty 5

## 2013-04-24 MED ORDER — HYDROMORPHONE HCL PF 1 MG/ML IJ SOLN
0.2500 mg | INTRAMUSCULAR | Status: DC | PRN
  Administered 2013-04-24 (×4): 0.5 mg via INTRAVENOUS

## 2013-04-24 MED ORDER — PROPOFOL 10 MG/ML IV BOLUS
INTRAVENOUS | Status: DC | PRN
  Administered 2013-04-24: 150 mg via INTRAVENOUS

## 2013-04-24 MED ORDER — LIDOCAINE HCL (CARDIAC) 20 MG/ML IV SOLN
INTRAVENOUS | Status: DC | PRN
  Administered 2013-04-24: 50 mg via INTRAVENOUS

## 2013-04-24 MED ORDER — POTASSIUM CHLORIDE IN NACL 20-0.9 MEQ/L-% IV SOLN
INTRAVENOUS | Status: AC
  Filled 2013-04-24: qty 1000

## 2013-04-24 MED ORDER — HYDROMORPHONE HCL PF 1 MG/ML IJ SOLN
INTRAMUSCULAR | Status: DC | PRN
  Administered 2013-04-24: 0.5 mg via INTRAVENOUS
  Administered 2013-04-24: 1 mg via INTRAVENOUS
  Administered 2013-04-24: 0.5 mg via INTRAVENOUS

## 2013-04-24 MED ORDER — DEXTROSE 5 % IV SOLN
10.0000 mg | INTRAVENOUS | Status: DC | PRN
  Administered 2013-04-24: 40 ug via INTRAVENOUS

## 2013-04-24 MED ORDER — PROMETHAZINE HCL 25 MG/ML IJ SOLN: 6.2500 mg | INTRAMUSCULAR | Status: DC | PRN

## 2013-04-24 MED ORDER — 0.9 % SODIUM CHLORIDE (POUR BTL) OPTIME
TOPICAL | Status: DC | PRN
  Administered 2013-04-24: 1000 mL

## 2013-04-24 MED ORDER — MEPERIDINE HCL 50 MG/ML IJ SOLN: 6.2500 mg | INTRAMUSCULAR | Status: DC | PRN

## 2013-04-24 MED ORDER — OXYCODONE HCL 5 MG/5ML PO SOLN
5.0000 mg | Freq: Once | ORAL | Status: DC | PRN
  Filled 2013-04-24: qty 5

## 2013-04-24 MED ORDER — HYDROMORPHONE HCL PF 2 MG/ML IJ SOLN
INTRAMUSCULAR | Status: AC
  Filled 2013-04-24: qty 1

## 2013-04-24 MED ORDER — NEOSTIGMINE METHYLSULFATE 1 MG/ML IJ SOLN
INTRAMUSCULAR | Status: DC | PRN
  Administered 2013-04-24: 5 mg via INTRAVENOUS

## 2013-04-24 MED ORDER — PROPOFOL 10 MG/ML IV BOLUS
INTRAVENOUS | Status: AC
  Filled 2013-04-24: qty 20

## 2013-04-24 MED ORDER — ROCURONIUM BROMIDE 100 MG/10ML IV SOLN
INTRAVENOUS | Status: DC | PRN
  Administered 2013-04-24: 50 mg via INTRAVENOUS
  Administered 2013-04-24 (×3): 10 mg via INTRAVENOUS
  Administered 2013-04-24: 20 mg via INTRAVENOUS

## 2013-04-24 MED ORDER — ONDANSETRON HCL 4 MG/2ML IJ SOLN
INTRAMUSCULAR | Status: DC | PRN
  Administered 2013-04-24: 4 mg via INTRAVENOUS

## 2013-04-24 MED ORDER — METOPROLOL TARTRATE 25 MG/10 ML ORAL SUSPENSION
12.5000 mg | Freq: Every day | ORAL | Status: DC
  Filled 2013-04-24 (×2): qty 5

## 2013-04-24 MED ORDER — LACTATED RINGERS IR SOLN
Status: DC | PRN
  Administered 2013-04-24: 3000 mL

## 2013-04-24 MED ORDER — PROMETHAZINE HCL 25 MG/ML IJ SOLN
12.5000 mg | Freq: Four times a day (QID) | INTRAMUSCULAR | Status: DC | PRN
  Administered 2013-04-25: 12.5 mg via INTRAVENOUS
  Filled 2013-04-24: qty 1

## 2013-04-24 MED ORDER — SUCCINYLCHOLINE CHLORIDE 20 MG/ML IJ SOLN
INTRAMUSCULAR | Status: DC | PRN
  Administered 2013-04-24: 100 mg via INTRAVENOUS

## 2013-04-24 MED ORDER — OXYCODONE HCL 5 MG PO TABS: 5.0000 mg | ORAL_TABLET | Freq: Once | ORAL | Status: DC | PRN

## 2013-04-24 MED ORDER — LACTATED RINGERS IV SOLN
INTRAVENOUS | Status: DC | PRN
  Administered 2013-04-24 (×3): via INTRAVENOUS

## 2013-04-24 MED ORDER — CEFAZOLIN SODIUM-DEXTROSE 2-3 GM-% IV SOLR
INTRAVENOUS | Status: AC
  Filled 2013-04-24: qty 50

## 2013-04-24 MED ORDER — HYDROCODONE-ACETAMINOPHEN 7.5-325 MG PO TABS: 1.0000 | ORAL_TABLET | ORAL | Status: DC | PRN

## 2013-04-24 MED ORDER — ACETAMINOPHEN 10 MG/ML IV SOLN
1000.0000 mg | Freq: Once | INTRAVENOUS | Status: AC
  Administered 2013-04-24: 1000 mg via INTRAVENOUS
  Filled 2013-04-24: qty 100

## 2013-04-24 MED ORDER — ONDANSETRON HCL 4 MG/2ML IJ SOLN
INTRAMUSCULAR | Status: AC
  Filled 2013-04-24: qty 2

## 2013-04-24 MED ORDER — SUCRALFATE 1 GM/10ML PO SUSP
1.0000 g | Freq: Three times a day (TID) | ORAL | Status: DC
  Filled 2013-04-24 (×6): qty 10

## 2013-04-24 MED ORDER — ONDANSETRON HCL 4 MG/2ML IJ SOLN
4.0000 mg | INTRAMUSCULAR | Status: DC | PRN
  Administered 2013-04-24 – 2013-04-25 (×3): 4 mg via INTRAVENOUS
  Filled 2013-04-24 (×4): qty 2

## 2013-04-24 SURGICAL SUPPLY — 55 items
APPLIER CLIP 5 13 M/L LIGAMAX5 (MISCELLANEOUS)
APPLIER CLIP ROT 10 11.4 M/L (STAPLE)
APR CLP MED LRG 11.4X10 (STAPLE)
APR CLP MED LRG 5 ANG JAW (MISCELLANEOUS)
CABLE HIGH FREQUENCY MONO STRZ (ELECTRODE) ×2 IMPLANT
CANISTER SUCTION 2500CC (MISCELLANEOUS) ×1 IMPLANT
CLIP APPLIE 5 13 M/L LIGAMAX5 (MISCELLANEOUS) IMPLANT
CLIP APPLIE ROT 10 11.4 M/L (STAPLE) IMPLANT
DECANTER SPIKE VIAL GLASS SM (MISCELLANEOUS) ×2 IMPLANT
DRAIN CHANNEL 19F RND (DRAIN) ×1 IMPLANT
DRAIN CHANNEL RND F F (WOUND CARE) IMPLANT
DRAPE LAPAROSCOPIC ABDOMINAL (DRAPES) ×2 IMPLANT
DRAPE UTILITY XL STRL (DRAPES) ×2 IMPLANT
DRAPE WARM FLUID 44X44 (DRAPE) ×2 IMPLANT
DRSG TEGADERM 2-3/8X2-3/4 SM (GAUZE/BANDAGES/DRESSINGS) ×4 IMPLANT
DRSG TEGADERM 4X4.75 (GAUZE/BANDAGES/DRESSINGS) ×1 IMPLANT
ELECT REM PT RETURN 9FT ADLT (ELECTROSURGICAL) ×2
ELECTRODE REM PT RTRN 9FT ADLT (ELECTROSURGICAL) ×1 IMPLANT
EVACUATOR SILICONE 100CC (DRAIN) ×1 IMPLANT
FELT TEFLON 4 X1 (Mesh General) ×2 IMPLANT
FILTER SMOKE EVAC LAPAROSHD (FILTER) ×1 IMPLANT
GAUZE SPONGE 2X2 8PLY STRL LF (GAUZE/BANDAGES/DRESSINGS) IMPLANT
GLOVE ECLIPSE 8.0 STRL XLNG CF (GLOVE) ×2 IMPLANT
GLOVE INDICATOR 8.0 STRL GRN (GLOVE) ×2 IMPLANT
GOWN STRL REIN XL XLG (GOWN DISPOSABLE) ×6 IMPLANT
GRAFT FLEX HD 10X16 THICK (Tissue Mesh) ×1 IMPLANT
KIT BASIN OR (CUSTOM PROCEDURE TRAY) ×2 IMPLANT
LEGGING LITHOTOMY PAIR STRL (DRAPES) ×2 IMPLANT
MARKER SKIN DUAL TIP RULER LAB (MISCELLANEOUS) ×2 IMPLANT
MESH ULTRAPRO 3X6 7.6X15CM (Mesh General) ×1 IMPLANT
PUNCH BIOPSY 3 (MISCELLANEOUS) ×1 IMPLANT
SCALPEL HARMONIC ACE (MISCELLANEOUS) ×2 IMPLANT
SCISSORS LAP 5X35 DISP (ENDOMECHANICALS) ×2 IMPLANT
SET IRRIG TUBING LAPAROSCOPIC (IRRIGATION / IRRIGATOR) ×2 IMPLANT
SLEEVE XCEL OPT CAN 5 100 (ENDOMECHANICALS) ×5 IMPLANT
SPONGE DRAIN TRACH 4X4 STRL 2S (GAUZE/BANDAGES/DRESSINGS) IMPLANT
SPONGE GAUZE 2X2 STER 10/PKG (GAUZE/BANDAGES/DRESSINGS) ×1
STAPLER VISISTAT 35W (STAPLE) ×2 IMPLANT
SUT ETHIBOND 0 (SUTURE) IMPLANT
SUT ETHIBOND 0 36 GRN (SUTURE) ×4 IMPLANT
SUT ETHIBOND CT1 BRD #0 30IN (SUTURE) IMPLANT
SUT ETHIBOND NAB CT1 #1 30IN (SUTURE) ×3 IMPLANT
SUT ETHILON 2 0 PS N (SUTURE) IMPLANT
SUT MNCRL AB 4-0 PS2 18 (SUTURE) ×1 IMPLANT
TIP INNERVISION DETACH 40FR (MISCELLANEOUS) IMPLANT
TIP INNERVISION DETACH 50FR (MISCELLANEOUS) IMPLANT
TIP INNERVISION DETACH 56FR (MISCELLANEOUS) ×1 IMPLANT
TIPS INNERVISION DETACH 40FR (MISCELLANEOUS)
TOWEL OR 17X26 10 PK STRL BLUE (TOWEL DISPOSABLE) ×2 IMPLANT
TOWEL OR NON WOVEN STRL DISP B (DISPOSABLE) ×2 IMPLANT
TRAY FOLEY CATH 14FRSI W/METER (CATHETERS) ×2 IMPLANT
TRAY LAP CHOLE (CUSTOM PROCEDURE TRAY) ×2 IMPLANT
TROCAR BLADELESS OPT 5 100 (ENDOMECHANICALS) ×2 IMPLANT
TROCAR XCEL NON-BLD 11X100MML (ENDOMECHANICALS) ×2 IMPLANT
TUBING FILTER THERMOFLATOR (ELECTROSURGICAL) ×2 IMPLANT

## 2013-04-24 NOTE — Telephone Encounter (Signed)
Received the manometry results by fax and sent to Dr Michaell Cowing at the hospital.

## 2013-04-24 NOTE — H&P (View-Only) (Signed)
[name] [name] [id number] 01/15/1936  CARE TEAM:  PCP: MCKEOWN,WILLIAM DAVID, MD  Outpatient Care Team: Patient Care Team: William McKeown, MD as PCP - General (Internal Medicine) Robert D Kaplan, MD as Consulting Physician (Gastroenterology) Rakesh V Alva, MD as Consulting Physician (Pulmonary Disease) Dalton S McLean, MD as Consulting Physician (Cardiology) Haidar Muse C. [name], MD as Consulting Physician (General Surgery) James E Nitka, MD as Consulting Physician (Orthopedic Surgery)  Inpatient Treatment Team: Treatment Team: Attending Provider: Md Ccs, MD; Rounding Team: Md Ccs, MD; Consulting Physician: Minola Guin C. Morrill Bomkamp, MD; Technician: Tawanda C McNeill, NT; Registered Nurse: Jilaine E Thompson, RN; Registered Nurse: Aanvi H Pleasant, RN   Subjective:  Tired but feels a little better Walking in room Trying sips only. No nausea/retching.  Pain less  Objective:  Vital signs:  Filed Vitals:   04/22/13 1655 04/22/13 2104 04/23/13 0122 04/23/13 0533  BP: 161/77 162/77 146/74 151/84  Pulse: 92 80 70 74  Temp: 98.2 F (36.8 C) 98.3 F (36.8 C) 97.9 F (36.6 C) 97.8 F (36.6 C)  TempSrc: Oral Oral Oral Oral  Resp: 18 18 18 18  SpO2: 97% 95% 97% 99%       Intake/Output   Yesterday:  12/15 0701 - 12/16 0700 In: 1000 [IV Piggyback:1000] Out: 700 [Urine:700] This shift:  Total I/O In: -  Out: 500 [Urine:500]  Bowel function:  Flatus: y  BM: n  Drain: n/a  Physical Exam:  General: Pt awakens to be alert/oriented x4 in no acute distress Eyes: PERRL, normal EOM.  Sclera clear.  No icterus Neuro: CN II-XII intact w/o focal sensory/motor deficits. Lymph: No head/neck/groin lymphadenopathy Psych:  No delerium/psychosis/paranoia HENT: Normocephalic, Mucus membranes moist.  No thrush Neck: Supple, No tracheal deviation Chest: No chest wall pain w good excursion CV:  Pulses intact.  Regular rhythm MS: Normal AROM mjr joints.  No obvious deformity Abdomen: Soft.   Nondistended but mod fullness supraumbilical.  Nontender.  No evidence of peritonitis.  No incarcerated hernias. Ext:  SCDs BLE.  No mjr edema.  No cyanosis Skin: No petechiae / purpura   Problem List:   Active Problems:   HYPERTENSION   ASTHMA   Incarcerated recurrent paraesophageal hiatal hernia   GASTRITIS   Chest pain   Constipation, chronic   Anxiety   Nausea and vomiting   Assessment  [name] [name]  77 y.o. female       Incarcerated paraesophageal hiatal hernia with worsening dysphagia, nausea, chest pain  Plan:  Retry oral liquids.  Advanced to period.  If tolerates, transition to home and set up elective repair early next week.  And cannot tolerate this, will try and do repair of this admission.  Difficult with end of year overbooked operating rooms, but her body cannot wait much longer.  I did discuss this with her.  I do think that a laparoscopic approach is feasible with an increased risk of conversion/esophageal or gastric injury:  The anatomy & physiology of the foregut and anti-reflux mechanism was discussed.  The pathophysiology of hiatal herniation and GERD was discussed.  Natural history risks without surgery was discussed.   The patient's symptoms are not adequately controlled by medicines and other non-operative treatments.  I feel the risks of no intervention will lead to serious problems that outweigh the operative risks; therefore, I recommended surgery to reduce the hiatal hernia out of the chest and fundoplication to rebuild the anti-reflux valve and control reflux better.  Need for a thorough workup to rule out the   differential diagnosis and plan treatment was explained.  I explained laparoscopic techniques with possible need for an open approach.  Risks such as bleeding, infection, abscess, leak, need for further treatment, heart attack, death, and other risks were discussed.   I noted a good likelihood this will help address the problem.  Goals of  post-operative recovery were discussed as well.  Possibility that this will not correct all symptoms was explained.  Post-operative dysphagia, need for short-term liquid & pureed diet, inability to vomit, possibility of reherniation, possible need for medicines to help control symptoms in addition to surgery were discussed.  We will work to minimize complications.   Educational handouts further explaining the pathology, treatment options, and dysphagia diet was given as well.  Questions were answered.  The patient expresses understanding & wishes to proceed with surgery.   -VTE prophylaxis- SCDs, etc  -mobilize as tolerated to help recovery  [employer] C. Khyri Hinzman, M.D., F.A.C.S. Gastrointestinal and Minimally Invasive Surgery [facility] [address], [address] [place], [place] [address] [phone] Main / Paging   04/23/2013   Results:   Labs: Results for orders placed during the hospital encounter of 04/22/13 (from the past 48 hour(s))  COMPREHENSIVE METABOLIC PANEL     Status: Abnormal   Collection Time    04/22/13 12:26 PM      Result Value Range   Sodium 142  135 - 145 mEq/L   Potassium 3.4 (*) 3.5 - 5.1 mEq/L   Chloride 103  96 - 112 mEq/L   CO2 21  19 - 32 mEq/L   Glucose, Bld 89  70 - 99 mg/dL   BUN 11  6 - 23 mg/dL   Creatinine, Ser 0.89  0.50 - 1.10 mg/dL   Calcium 9.9  8.4 - 10.5 mg/dL   Total Protein 7.5  6.0 - 8.3 g/dL   Albumin 4.3  3.5 - 5.2 g/dL   AST 15  0 - 37 U/L   ALT 12  0 - 35 U/L   Alkaline Phosphatase 62  39 - 117 U/L   Total Bilirubin 0.6  0.3 - 1.2 mg/dL   GFR calc non Af Amer 62 (*) >90 mL/min   GFR calc Af Amer 72 (*) >90 mL/min   Comment: (NOTE)     The eGFR has been calculated using the CKD EPI equation.     This calculation has not been validated in all clinical situations.     eGFR's persistently <90 mL/min signify possible Chronic Kidney     Disease.  LIPASE, BLOOD     Status: None   Collection Time    04/22/13  12:26 PM      Result Value Range   Lipase 21  11 - 59 U/L  CBC WITH DIFFERENTIAL     Status: Abnormal   Collection Time    04/22/13 12:26 PM      Result Value Range   WBC 6.7  4.0 - 10.5 K/uL   RBC 4.48  3.87 - 5.11 MIL/uL   Hemoglobin 13.5  12.0 - 15.0 g/dL   HCT 39.3  36.0 - 46.0 %   MCV 87.7  78.0 - 100.0 fL   MCH 30.1  26.0 - 34.0 pg   MCHC 34.4  30.0 - 36.0 g/dL   RDW 16.5 (*) 11.5 - 15.5 %   Platelets 344  150 - 400 K/uL   Neutrophils Relative % 64  43 - 77 %   Neutro Abs 4.3    1.7 - 7.7 K/uL   Lymphocytes Relative 25  12 - 46 %   Lymphs Abs 1.7  0.7 - 4.0 K/uL   Monocytes Relative 9  3 - 12 %   Monocytes Absolute 0.6  0.1 - 1.0 K/uL   Eosinophils Relative 2  0 - 5 %   Eosinophils Absolute 0.1  0.0 - 0.7 K/uL   Basophils Relative 1  0 - 1 %   Basophils Absolute 0.0  0.0 - 0.1 K/uL  POCT I-STAT TROPONIN I     Status: None   Collection Time    04/22/13  2:04 PM      Result Value Range   Troponin i, poc 0.01  0.00 - 0.08 ng/mL   Comment 3            Comment: Due to the release kinetics of cTnI,     a negative result within the first hours     of the onset of symptoms does not rule out     myocardial infarction with certainty.     If myocardial infarction is still suspected,     repeat the test at appropriate intervals.  URINALYSIS, ROUTINE W REFLEX MICROSCOPIC     Status: Abnormal   Collection Time    04/22/13  2:22 PM      Result Value Range   Color, Urine YELLOW  YELLOW   APPearance CLEAR  CLEAR   Specific Gravity, Urine 1.013  1.005 - 1.030   pH 8.5 (*) 5.0 - 8.0   Glucose, UA NEGATIVE  NEGATIVE mg/dL   Hgb urine dipstick NEGATIVE  NEGATIVE   Bilirubin Urine NEGATIVE  NEGATIVE   Ketones, ur 15 (*) NEGATIVE mg/dL   Protein, ur NEGATIVE  NEGATIVE mg/dL   Urobilinogen, UA 0.2  0.0 - 1.0 mg/dL   Nitrite NEGATIVE  NEGATIVE   Leukocytes, UA NEGATIVE  NEGATIVE   Comment: MICROSCOPIC NOT DONE ON URINES WITH NEGATIVE PROTEIN, BLOOD, LEUKOCYTES, NITRITE, OR GLUCOSE  <1000 mg/dL.  CBC     Status: Abnormal   Collection Time    04/22/13  3:35 PM      Result Value Range   WBC 5.0  4.0 - 10.5 K/uL   RBC 4.34  3.87 - 5.11 MIL/uL   Hemoglobin 13.0  12.0 - 15.0 g/dL   HCT 38.2  36.0 - 46.0 %   MCV 88.0  78.0 - 100.0 fL   MCH 30.0  26.0 - 34.0 pg   MCHC 34.0  30.0 - 36.0 g/dL   RDW 16.4 (*) 11.5 - 15.5 %   Platelets 348  150 - 400 K/uL  CREATININE, SERUM     Status: Abnormal   Collection Time    04/22/13  3:35 PM      Result Value Range   Creatinine, Ser 0.86  0.50 - 1.10 mg/dL   GFR calc non Af Amer 64 (*) >90 mL/min   GFR calc Af Amer 75 (*) >90 mL/min   Comment: (NOTE)     The eGFR has been calculated using the CKD EPI equation.     This calculation has not been validated in all clinical situations.     eGFR's persistently <90 mL/min signify possible Chronic Kidney     Disease.    Imaging / Studies: Dg Abd Acute W/chest  04/22/2013   CLINICAL DATA:  Upper abdominal pain  EXAM: ACUTE ABDOMEN SERIES (ABDOMEN 2 VIEW & CHEST 1 VIEW)  COMPARISON:  03/12/2013  FINDINGS: Cardiomediastinal silhouette is   stable. Moderate size hiatal hernia. No acute infiltrate or pleural effusion. No pulmonary edema.  There is nonspecific nonobstructive bowel gas pattern. Postcholecystectomy surgical clips are noted. No free abdominal air.  IMPRESSION: No acute disease. Moderate size hiatal hernia. Nonspecific nonobstructive bowel gas pattern.   Electronically Signed   By: Liviu  Pop M.D.   On: 04/22/2013 13:22    Medications / Allergies: per chart  Antibiotics: Anti-infectives   Start     Dose/Rate Route Frequency Ordered Stop   04/22/13 1800  fluconazole (DIFLUCAN) tablet 200 mg     200 mg Oral Daily 04/22/13 1749                

## 2013-04-24 NOTE — Anesthesia Preprocedure Evaluation (Addendum)
Anesthesia Evaluation  Patient identified by MRN, date of birth, ID band Patient awake    Reviewed: Allergy & Precautions, H&P , NPO status , Patient's Chart, lab work & pertinent test results  History of Anesthesia Complications (+) PONV and history of anesthetic complications  Airway Mallampati: II TM Distance: >3 FB Neck ROM: Full    Dental no notable dental hx. (+) Dental Advisory Given   Pulmonary shortness of breath, asthma , pneumonia -,  breath sounds clear to auscultation  Pulmonary exam normal       Cardiovascular hypertension, Pt. on medications + CAD Rhythm:Regular Rate:Normal     Neuro/Psych PSYCHIATRIC DISORDERS Anxiety TIA Neuromuscular disease    GI/Hepatic Neg liver ROS, hiatal hernia, Bowel prep,GERD-  Medicated,  Endo/Other  negative endocrine ROS  Renal/GU negative Renal ROS     Musculoskeletal negative musculoskeletal ROS (+)   Abdominal   Peds  Hematology  (+) anemia ,   Anesthesia Other Findings   Reproductive/Obstetrics negative OB ROS                          Anesthesia Physical  Anesthesia Plan  ASA: III  Anesthesia Plan: General   Post-op Pain Management:    Induction: Intravenous, Rapid sequence and Cricoid pressure planned  Airway Management Planned: Oral ETT  Additional Equipment:   Intra-op Plan:   Post-operative Plan: Extubation in OR  Informed Consent: I have reviewed the patients History and Physical, chart, labs and discussed the procedure including the risks, benefits and alternatives for the proposed anesthesia with the patient or authorized representative who has indicated his/her understanding and acceptance.   Dental advisory given  Plan Discussed with: CRNA  Anesthesia Plan Comments:        Anesthesia Quick Evaluation

## 2013-04-24 NOTE — Transfer of Care (Signed)
Immediate Anesthesia Transfer of Care Note  Patient: [name] [name]  Procedure(s) Performed: Procedure(s) with comments: LAPAROSCOPIC  REPAIR RECURRENT PARASOPHAGEAL HIATAL HERNIA WITH FUNDOPLICATION (N/A) INSERTION OF MESH (N/A) LAPAROSCOPIC LYSIS OF ADHESIONS (N/A) - times 2 hours   Patient Location: PACU  Anesthesia Type:General  Level of Consciousness: awake and alert   Airway & Oxygen Therapy: Patient Spontanous Breathing and Patient connected to face mask oxygen  Post-op Assessment: Report given to PACU RN and Post -op Vital signs reviewed and stable  Post vital signs: Reviewed and stable  Complications: No apparent anesthesia complications

## 2013-04-24 NOTE — Interval H&P Note (Signed)
History and Physical Interval Note:  04/24/2013 8:26 AM  [name] [name]  has presented today for surgery, with the diagnosis of RECURRENT Paraesophageal hital hernia refractory to medical management  The various methods of treatment have been discussed with the patient and family. After consideration of risks, benefits and other options for treatment, the patient has consented to  Procedure(s): LAPAROSCOPIC  REPAIR RECURRENT PARASOPHAGEAL HIATAL HERNIA WITH FUNDOPLICATION (N/A) as a surgical intervention .  The patient's history has been reviewed, patient examined, no change in status, stable for surgery.  I have reviewed the patient's chart and labs.  Questions were answered to the patient's satisfaction.     Oluwatobi Visser C.

## 2013-04-24 NOTE — Op Note (Signed)
04/22/2013 - 04/24/2013  1:30 PM  PATIENT:  [name] [name]  77 y.o. female  Patient Care Team: Lucky Cowboy, MD as PCP - General (Internal Medicine) Louis Meckel, MD as Consulting Physician (Gastroenterology) Oretha Milch, MD as Consulting Physician (Pulmonary Disease) Laurey Morale, MD as Consulting Physician (Cardiology) Ardeth Sportsman, MD as Consulting Physician (General Surgery) Kerrin Champagne, MD as Consulting Physician (Orthopedic Surgery)  PRE-OPERATIVE DIAGNOSIS:  RECURRENT Paraesophageal hiatal hernia refractory to medical management  POST-OPERATIVE DIAGNOSIS:  RECURRENT Paraesophageal hiatal hernia refractory to medical management  PROCEDURE:  Procedure(s):  LAPAROSCOPIC LYSIS OF ADHESIONS x 2hours (1/2 case) Type II mediastinal dissection. Laparoscopic reduction of paraesophageal hiatal hernia Primary repair of hiatal hernia over pledgets. INSERTION OF MESH UltraPro over crural closure and biologic over the esophageal hiatus Anterior & posterior gastropexy. Nissen fundoplication 2cm over a 56-French bougie  SURGEON:  Surgeon(s): Ardeth Sportsman, MD  ANESTHESIA:   local and general  EBL:  Total I/O In: 2000 [I.V.:2000] Out: 150 [Urine:150]  Delay start of Pharmacological VTE agent (>24hrs) due to surgical blood loss or risk of bleeding:  no  ANESTHESIA: 1. General anesthesia. 2. Local anesthetic in a field block around all port sites.  SPECIMEN:  Mediastinal hernia sac (not sent).  DRAINS:  A 19-French Blake drain goes from the right upper quadrant along the lesser curvature of the stomach into the mediastinum.  COUNTS:  YES  PLAN OF CARE: Admit to inpatient   PATIENT DISPOSITION:  PACU - hemodynamically stable.  INDICATION:   Patient with symptomatic recurrent paraesophageal hiatal hernia.  The patient has had extensive work-up & is felt to benefit from repair:  The anatomy & physiology of the foregut and anti-reflux mechanism was discussed.   The pathophysiology of hiatal herniation and GERD was discussed.  Natural history risks without surgery was discussed.   The patient's symptoms are not adequately controlled by medicines and other non-operative treatments.  I feel the risks of no intervention will lead to serious problems that outweigh the operative risks; therefore, I recommended surgery to reduce the hiatal hernia out of the chest and fundoplication to rebuild the anti-reflux valve and control reflux better.  Need for a thorough workup to rule out the differential diagnosis and plan treatment was explained.  I explained laparoscopic techniques with possible need for an open approach.  Risks such as bleeding, infection, abscess, leak, need for further treatment, heart attack, death, and other risks were discussed.   I noted a good likelihood this will help address the problem.  Goals of post-operative recovery were discussed as well.  Possibility that this will not correct all symptoms was explained.  Post-operative dysphagia, need for short-term liquid & pureed diet, inability to vomit, possibility of reherniation, possible need for medicines to help control symptoms in addition to surgery were discussed.  We will work to minimize complications.   Educational handouts further explaining the pathology, treatment options, and dysphagia diet was given as well.  Questions were answered.  The patient expresses understanding & wishes to proceed with surgery.     OR FINDINGS:   Moderate-sized paraesophageal hiatal hernia with 50% of the stomach in the mediastinum.  There was a 9x 7cm hiatal defect.  It is a primary repair over pledgets. The primary repair is repaired with an onlay ultrasound probe-6 x 9 cm.  That is covered with a 10 x 16 cm MTS biologic mesh in a U shape.  Tails going anteriorly.  The patient has  a 2 cm Nissen fundoplication that was done over 56-French bougie.  The patient has had anterior and posterior  gastropexy.  DESCRIPTION:   Informed consent was confirmed.  The patient received IV antibiotics prior to incision.  The underwent general anesthesia without difficulty.  A Foley catheter sterilely placed.  The patient was positioned in split leg with arms tucked. The abdomen was prepped and draped in the sterile fashion.  Surgical time-out confirmed our plan.  I placed a 5 mm port in the left upper quadrant paramedian region using optical entry technique with the patient in steep reverse Trendelenburg and left side up.  Entry was clean.  We induced carbon dioxide insufflation.  Camera inspection revealed no injury.  Under Direct visualization, I placed 5 mm port in the right mid abdomen and a 10 mm port on the anterior axillary line on the left subcostal ridge.  I also placed a 5 mm port in the left subxiphoid region under direct visualization.  I removed that and placed an Omega-shaped rigid Nathanson liver retractor to lift the left lateral sector of the liver anteriorly to expose the esophageal hiatus.  This was secured to the bed using the iron man system.  I placed another 5mm port in the subxiphoid region.  I noted some adhesions of the fundoplication to the anterior stomach.  There was an obvious hiatal hernia recurrence posterior laterally.  I was able to sharply lyse adhesions off the left crus.  I then came around more anteriorly and then freed adhesions off the right crus as well carefully.  Primarily used focused harmonic dissection and cold scissors.  Occasional blunt hydro-dissection was gently used as well.  We grasped the anterior mediastinal sac at the apex of the crus.  I scored through that and got into the anterior mediastinum.  I was able to free the mediastinal sac from its attachments to the pericardium and bilateral pleura using primarily focused gentle blunt dissection as well as focused ultrasonic Harmonic dissection.  II found the base of the crura.  I then came  around anteriorly on the left side and freed up the phrenoesophageal attachments of the mediastinal sac on the medial part of the left crus on the superior half.  I did careful mediastinal dissection to free the mediastinal sac.  With that, we could relieve the suction cup affect of the hernia sac and help reduce the stomach back down into the abdomen, flipped back approriately.    The short gastrics had already been ligated.  I took a few more distally along the lesser curvature of the stomach about 1/2 way down and then came up over the fundus.  We released the attachments of the stomach to the retroperitoneum until we were able to connect with the prior dissection on the left crus.  We completed the release of phrenoesophageal attachments to the medial part of the left crus down to its base.  With this, we had circumferential mobilization.    We placed the stomach and esophagus on axial tension.  I then did a type 2 mediastinal dissection where I freed the esophagus from its attachments to the aorta, pleura, and pericardium using primarily gentle blunt as well as focused ultrasonic dissection.  We saw the anterior vagus nerve, it was mostly intact anteriorly.  The posterior was intact.  We preserved it at all times.  I procedded to dissect about 25 cm up into the mediastinum.  With that I could straighten out the esophagus and get  5 cm of intra-abdominal length of the esophagus at a best estimation.  Because of the prior surgery, there was not as much hernia sac to remove.  It did free some of the left crus.  Also freed some of the anterior vagus nerve, taking care to preserve it.  I dissected out & removed a small remnant the anterior epiphrenic pad.  Posterior epiphrenic fat was skeletonized and freed off as well. With that, I could better define the esophagogastric junction.  I confirmed and I did have 5 cm of intra-abdominal esophageal length off tension.  I brought the fundus of the stomach  posterior to the esophagus over to the right side.  The wrap was mobile with the classic shoe shine maneuver.  Wrap became together gently. We reflected the stomach left laterally and closed the esophageal hiatus using 0 Ethibond stitch using horizontal mattress stitches with pledgets on both sides.  I did have to release the right crus to help get it together.  I did that x3 stitches.  The left crura had good substance and they came together well without any tension.  I then reinforced crural closure since it was recurrent.  I took a sheet of ultra Pro mesh and cut it to 6 x 9 cm.  I laid it as an onlay over the crural repair.  I secured it by using #1 Ethibond interrupted mattress stitches.  I did a stitch on good fascia of the lateral right crus just lateral to the IVC, Covering over the region that had been released.  I tied that down.  I also did stitches through the strong left crura and through the closure and tied that down.  2 stitches on each side.  The mesh laid well.  To protect from mesh erosion and reinforce anteriorly, I used a 10 x 16 cm biologic MTF mesh.  I cut a defect into it such that it looked like a long U-shape.  6 cm anterior limb on one end and 8 cm on the other.  I placed it at the corners of the anterior tails on the ends of the U tails.  I then placed it into the abdomen.  I placed the mesh on top of the crural closure.  I secured stitches to the anterior crura.  I did mobilize the left lateral sector of liver off of the diaphragm area.  I did  encounter a small branch bleeder off the phrenic vein which I closed with a single Ethibond stitch.  I did secure stitches on the left anterior crural diaphragm  medially and laterally.  The mesh tucked well over the diaphragm above the spleen.  I reset up the and Nissen wrap and did a posterior gastropexy by taking couple of #1 Ethibond stitches to the posterior part of the right side of the wrap and thru the crural closure and tied that  down for good posterior gastropexy up against the hiatal closure.  I then did a left anterior gastropexy taking the apex of the left side of the wrap and tacked it to the left anterior crura just posterior to the phrenic vein. I tied that down.  This helped the wrap for completion.  Next, Anesthesia passed a 56-French bougie transorally.  It was a lighted bougie and we did do this under axial tension.  It passed down easily without resistance.  I did an internal esophagogastric plication by taking a stitch of the inner part of the left wrap to the left lateral  esophagus and tied that down with a 0 Ethibond stitch.  I did a similar, mirror-imaged stitch on the right side.  These were classic Guarner stitches.  I then did a classic 2 cm Nissen fundoplication on the true esophagus above the cardia using Ethibond stitch in the left side of the Wrap, then anterior esophagus, and then right side of the wrap and tied that down. Did 3 stitches.  I measured it and it was 2 cm in length.  We removed the bougie.  It was intact.    The wrap was soft and floppy.  I placed a drain as noted above.  I did irrigation and ensured hemostasis.  I saw no evidence of any leak or perforation or other abnormality.  I removed the [facility] liver retractor under direct visualization.  I evacuated carbon dioxide and removed the ports.  The skin was closed with Monocryl and sterile dressings applied.  The patient is being extubated and brought back to the recovery room.  I discussed postop care in detail with the patient and family in in the office.  Discussed again with the patient in the holding area.  I am about to discuss it with her family as well.

## 2013-04-24 NOTE — Anesthesia Postprocedure Evaluation (Signed)
Anesthesia Post Note  Patient: [name] [name]  Procedure(s) Performed: Procedure(s) (LRB): LAPAROSCOPIC  REPAIR RECURRENT PARASOPHAGEAL HIATAL HERNIA WITH FUNDOPLICATION (N/A) INSERTION OF MESH (N/A) LAPAROSCOPIC LYSIS OF ADHESIONS (N/A)  Anesthesia type: General  Patient location: PACU  Post pain: Pain level controlled  Post assessment: Post-op Vital signs reviewed  Last Vitals: BP 138/66  Pulse 9  Temp(Src) 36.5 C (Oral)  Resp 15  Ht 5\' 3"  (1.6 m)  Wt 151 lb (68.493 kg)  BMI 26.76 kg/m2  SpO2 100%  Post vital signs: Reviewed  Level of consciousness: sedated  Complications: No apparent anesthesia complications

## 2013-04-25 ENCOUNTER — Inpatient Hospital Stay (HOSPITAL_COMMUNITY): Payer: Medicare Other

## 2013-04-25 MED ORDER — HYDROCODONE-ACETAMINOPHEN 7.5-325 MG PO TABS
1.0000 | ORAL_TABLET | ORAL | Status: DC | PRN
  Administered 2013-04-25: 2 via ORAL
  Filled 2013-04-25: qty 2

## 2013-04-25 MED ORDER — IOHEXOL 300 MG/ML  SOLN
50.0000 mL | Freq: Once | INTRAMUSCULAR | Status: AC | PRN
  Administered 2013-04-25: 50 mL via ORAL

## 2013-04-25 MED ORDER — PROMETHAZINE HCL 25 MG/ML IJ SOLN: 6.2500 mg | Freq: Four times a day (QID) | INTRAMUSCULAR | Status: DC | PRN

## 2013-04-25 MED ORDER — ACETAMINOPHEN 325 MG PO TABS
650.0000 mg | ORAL_TABLET | Freq: Three times a day (TID) | ORAL | Status: DC
  Administered 2013-04-25 – 2013-04-26 (×4): 650 mg via ORAL
  Filled 2013-04-25 (×6): qty 2

## 2013-04-25 MED ORDER — ENSURE COMPLETE PO LIQD
237.0000 mL | Freq: Two times a day (BID) | ORAL | Status: DC
  Administered 2013-04-25 – 2013-04-26 (×2): 237 mL via ORAL

## 2013-04-25 MED ORDER — METOPROLOL TARTRATE 25 MG/10 ML ORAL SUSPENSION
12.5000 mg | Freq: Two times a day (BID) | ORAL | Status: DC
  Administered 2013-04-25 – 2013-04-26 (×2): 12.5 mg via ORAL
  Filled 2013-04-25 (×3): qty 5

## 2013-04-25 MED ORDER — DEXAMETHASONE SODIUM PHOSPHATE 4 MG/ML IJ SOLN
4.0000 mg | Freq: Two times a day (BID) | INTRAMUSCULAR | Status: DC
  Administered 2013-04-25 – 2013-04-26 (×3): 4 mg via INTRAVENOUS
  Filled 2013-04-25 (×4): qty 1

## 2013-04-25 MED ORDER — ENSURE COMPLETE PO LIQD: 237.0000 mL | Freq: Two times a day (BID) | ORAL | Status: DC

## 2013-04-25 NOTE — Progress Notes (Signed)
INITIAL NUTRITION ASSESSMENT  Pt meets criteria for severe MALNUTRITION in the context of chronic illness as evidenced by <75% estimated energy intake for the past month with 13.2% weight loss in the past 4 months.  DOCUMENTATION CODES Per approved criteria  -Severe malnutrition in the context of chronic illness   INTERVENTION: - Diet advancement to puree diet per MD - Educated pt and husband on post-op diet for gastric fundoplication. Discussed the importance of following puree diet for the next 2-3 weeks until wrap edema resolves. Sample meals reviewed. Texture and consistency of diet reviewed. Handouts provided. Teach back method used, expect good compliance. Gave RD contact information.  - Will continue to monitor  NUTRITION DIAGNOSIS: Inadequate oral intake related to inability to eat as evidenced by NPO.   Goal: Advance diet as tolerated to puree diet  Monitor:  Weights, labs, diet advancement, nausea  Reason for Assessment: Consult  77 y.o. female  Admitting Dx: Incarcerated hiatal hernia  ASSESSMENT: Admitted with nausea and epigastric/back pain. Hx of CAD, HTN/HLD, severe acid reflux status post Nissen fundoplication approximately 18 months ago. Met with pt and husband who report pt with 30 pound unintended weight loss in the past 1.5 months due to pt's worsening inability to eat. Husband reports pt started eating very small portions of baby foods throughout the day and drinking 2-3 Boost/day however it worsened to pt being only able to take a few bites of puree food for the entire day r/t nausea. Pt had nissen fundoplication yesterday with lysis of adhesions. C/o some soreness from surgery in addition to nausea, RN had recently given pt some anti-emetics.   Height: Ht Readings from Last 1 Encounters:  04/23/13 5\' 3"  (1.6 m)    Weight: Wt Readings from Last 1 Encounters:  04/23/13 151 lb (68.493 kg)    Ideal Body Weight: 115 lb   % Ideal Body Weight: 131%  Wt  Readings from Last 20 Encounters:  04/23/13 151 lb (68.493 kg)  04/23/13 151 lb (68.493 kg)  04/19/13 151 lb (68.493 kg)  04/15/13 151 lb (68.493 kg)  04/14/13 151 lb (68.493 kg)  04/09/13 151 lb 12.8 oz (68.856 kg)  04/09/13 152 lb (68.947 kg)  04/03/13 152 lb 6.4 oz (69.128 kg)  03/12/13 153 lb 12.8 oz (69.763 kg)  03/12/13 153 lb 12.8 oz (69.763 kg)  03/05/13 157 lb 11.2 oz (71.532 kg)  02/19/13 158 lb 6.4 oz (71.85 kg)  02/12/13 162 lb 8 oz (73.71 kg)  12/09/12 174 lb (78.926 kg)  06/23/12 171 lb 11.8 oz (77.9 kg)  06/23/12 171 lb 11.8 oz (77.9 kg)  06/20/12 175 lb 11.3 oz (79.7 kg)  04/02/12 166 lb 9.6 oz (75.569 kg)  02/20/12 165 lb 9.6 oz (75.116 kg)  01/25/12 160 lb (72.576 kg)    Usual Body Weight: 181 lb per pt and husband  % Usual Body Weight: 83%  BMI:  Body mass index is 26.76 kg/(m^2).  Estimated Nutritional Needs: Kcal: 1500-1700 Protein: 70-80g Fluid: 1.5-1.7L/day  Skin: Abdominal incision  Diet Order:  NPO  EDUCATION NEEDS: -Education needs addressed - educated pt on puree diet    Intake/Output Summary (Last 24 hours) at 04/25/13 1005 Last data filed at 04/25/13 0600  Gross per 24 hour  Intake 3628.33 ml  Output   2215 ml  Net 1413.33 ml    Last BM: 12/14  Labs:   Recent Labs Lab 04/19/13 1149 04/22/13 1226 04/22/13 1535 04/23/13 0542  NA 140 142  --  140  K 4.3 3.4*  --  4.1  CL 102 103  --  108  CO2 27 21  --  22  BUN 15 11  --  9  CREATININE 1.21* 0.89 0.86 0.84  CALCIUM 10.2 9.9  --  8.8  GLUCOSE 79 89  --  81    CBG (last 3)  No results found for this basename: GLUCAP,  in the last 72 hours  Scheduled Meds: . amLODipine  10 mg Oral q morning - 10a  . atorvastatin  20 mg Oral q1800  . calcitonin (salmon)  1 spray Alternating Nares q morning - 10a  . cholecalciferol  6,000 Units Oral Daily  . heparin  5,000 Units Subcutaneous Q8H  . lip balm   Topical BID  . losartan  100 mg Oral q morning - 10a  . metoprolol  tartrate  12.5 mg Oral QHS  . mometasone-formoterol  2 puff Inhalation BID  . multivitamin with minerals  1 tablet Oral q morning - 10a  . potassium chloride  10 mEq Oral BID  . saccharomyces boulardii  250 mg Oral BID  . sucralfate  1 g Oral TID WC & HS  . tiotropium  18 mcg Inhalation Daily    Continuous Infusions: . 0.9 % NaCl with KCl 20 mEq / L 50 mL/hr at 04/24/13 1537    Past Medical History  Diagnosis Date  . CAD (coronary artery disease)     LHC 2003 with 40% pLAD, 30% mLAD, 100% D1 (moderate vessel), 100%  D2 (small vessel), 95% mid small OM2.  Pt had PTCA to D1;  D2 and OM2 small vessels and tx medically;  Last Myoview (2012): anterior infarct seen with scar, no ischemia. EF normal. Pt managed medically;  Lexiscan Myoview (12/14):  Low risk; ant scar with very mild peri-infarct ischemia; EF 55% with ant AK   . Recurrent aspiration bronchitis/pneumonia     PFTs 12/09 FVC 97%, FEV1 101%, ratio 77%, TLC 109%. No significant obstruction or restriction.   . Hypertension   . Hyperlipidemia   . Esophageal stricture   . Diverticulosis of colon   . Gastroesophageal reflux disease with hiatal hernia   . Iron deficiency anemia   . Gastritis   . GERD (gastroesophageal reflux disease)   . Hiatal hernia   . Asthma   . Blood transfusion   . Cough   . Wheezing   . Weakness   . Trouble swallowing   . Abdominal pain   . Hiatal hernia   . Shortness of breath   . Anxiety   . Complication of anesthesia   . PONV (postoperative nausea and vomiting)   . TIA (transient ischemic attack)     Past Surgical History  Procedure Laterality Date  . Back surgery    . Cholecystectomy    . Bladder repair    . Hemorrhoid surgery    . Cholecystectomy      Patient unsure of date.  [name] Appendectomy      patient unsure of date  . Total abdominal hysterectomy  1975    partial  . Coronary angioplasty  11/16/2001  . Eye surgery  11/2000    bilateral cataracts with lens implant  . Hiatal hernia  repair  06/03/2011    Procedure: LAPAROSCOPIC REPAIR OF HIATAL HERNIA;  Surgeon: Ernestene Mention, MD;  Location: [facility] ORS;  Service: General;  Laterality: N/A;  . Laparoscopic nissen fundoplication  06/03/2011    Procedure: LAPAROSCOPIC NISSEN FUNDOPLICATION;  Surgeon: Mikey Bussing  Grayce Sessions, MD;  Location: [facility];  Service: General;  Laterality: N/A;  . Lumbar laminectomy/decompression microdiscectomy Right 06/26/2012    Procedure: LUMBAR LAMINECTOMY/DECOMPRESSION MICRODISCECTOMY;  Surgeon: Kerrin Champagne, MD;  Location: [facility] OR;  Service: Orthopedics;  Laterality: Right;  RIGHT L4-5 MICRODISCECTOMY  . Esophagogastroduodenoscopy N/A 03/15/2013    Procedure: ESOPHAGOGASTRODUODENOSCOPY (EGD);  Surgeon: Beverley Fiedler, MD;  Location: [facility] ENDOSCOPY;  Service: Endoscopy;  Laterality: N/A;  . Esophageal manometry N/A 03/25/2013    Procedure: ESOPHAGEAL MANOMETRY (EM);  Surgeon: Louis Meckel, MD;  Location: [facility] ENDOSCOPY;  Service: Endoscopy;  Laterality: N/A;  . Hiatal hernia repair N/A 04/24/2013    Procedure: LAPAROSCOPIC  REPAIR RECURRENT PARASOPHAGEAL HIATAL HERNIA WITH FUNDOPLICATION;  Surgeon: Ardeth Sportsman, MD;  Location: [facility];  Service: General;  Laterality: N/A;  . Insertion of mesh N/A 04/24/2013    Procedure: INSERTION OF MESH;  Surgeon: Ardeth Sportsman, MD;  Location: [facility];  Service: General;  Laterality: N/A;  . Laparoscopic lysis of adhesions N/A 04/24/2013    Procedure: LAPAROSCOPIC LYSIS OF ADHESIONS;  Surgeon: Ardeth Sportsman, MD;  Location: [facility];  Service: General;  Laterality: N/A;    Levon Hedger MS, RD, LDN [phone] Pager [phone] After Hours Pager

## 2013-04-25 NOTE — Progress Notes (Signed)
Keriana Sarsfield [id number] 30-Nov-1935  CARE TEAM:  PCP: [name], MD  Outpatient Care Team: Patient Care Team: Lucky Cowboy, MD as PCP - General (Internal Medicine) Louis Meckel, MD as Consulting Physician (Gastroenterology) Oretha Milch, MD as Consulting Physician (Pulmonary Disease) Laurey Morale, MD as Consulting Physician (Cardiology) Ardeth Sportsman, MD as Consulting Physician (General Surgery) Kerrin Champagne, MD as Consulting Physician (Orthopedic Surgery)  Inpatient Treatment Team: Treatment Team: Attending Provider: Ardeth Sportsman, MD; Consulting Physician: Ardeth Sportsman, MD; Registered Nurse: Royden Purl, RN; Registered Nurse: Dennard Schaumann Pleasant, RN; Registered Nurse: Magnus Sinning, RN; Registered Nurse: Bethann Goo, RN   Subjective:  Tired but feels a little better Walking in room Tolerating clears. Nausea but no retching.   Sore - meds help Husband & friends in room  Objective:  Vital signs:  Filed Vitals:   04/24/13 1810 04/24/13 2113 04/25/13 0211 04/25/13 0556  BP: 137/77 144/84 157/71 148/71  Pulse: 80 95 100 93  Temp: 98.3 F (36.8 C) 97.6 F (36.4 C) 98.1 F (36.7 C) 98.1 F (36.7 C)  TempSrc: Oral Oral Oral Oral  Resp: 18 18 18 18   Height:      Weight:      SpO2: 98% 99% 98% 97%    Last BM Date: 04/21/13  Intake/Output   Yesterday:  12/17 0701 - 12/18 0700 In: 3628.3 [I.V.:3628.3] Out: 2215 [Urine:2050; Drains:165] This shift:     Bowel function:  Flatus: y  BM: n  Drain: n/a  Physical Exam:  General: Pt awakens to be alert/oriented x4 in no acute distress Eyes: PERRL, normal EOM.  Sclera clear.  No icterus Neuro: CN II-XII intact w/o focal sensory/motor deficits. Lymph: No head/neck/groin lymphadenopathy Psych:  No delerium/psychosis/paranoia HENT: Normocephalic, Mucus membranes moist.  No thrush Neck: Supple, No tracheal deviation Chest: No chest wall pain w good excursion CV:  Pulses  intact.  Regular rhythm MS: Normal AROM mjr joints.  No obvious deformity Abdomen: Soft.  Min TTP at incisions.  No evidence of peritonitis.  No incarcerated hernias. Ext:  SCDs BLE.  No mjr edema.  No cyanosis Skin: No petechiae / purpura   Problem List:   Principal Problem:   Incarcerated recurrent paraesophageal hiatal hernia Active Problems:   HYPERTENSION   ASTHMA   ESOPHAGEAL STRICTURE   GASTRITIS   Chest pain   Constipation, chronic   Anxiety   Nausea and vomiting   Assessment  Lara Mulch  77 y.o. female  1 Day Post-Op    Incarcerated paraesophageal hiatal hernia with worsening dysphagia, nausea, chest pain S/P LAP RE-REPAIR W MESH, STABLE  Plan:  -adv diet to pureed as tolerated.  Dietary instructions -IV steroids for Nissen wrap edema & nausea -IV to PO pain control -VTE prophylaxis- SCDs, etc -mobilize as tolerated to help recovery  D/C patient from hospital when patient meets criteria (anticipate in 1-2 day(s)):  Tolerating oral pureed intake well Ambulating in walkways Adequate pain control without IV medications Urinating  Having flatus   Ardeth Sportsman, M.D., F.A.C.S. Gastrointestinal and Minimally Invasive Surgery [facility] [address], [address] [place], [place] [address] [phone] Main / Paging   04/25/2013   Results:   Labs: Results for orders placed during the hospital encounter of 04/22/13 (from the past 48 hour(s))  SURGICAL PCR SCREEN     Status: None   Collection Time    04/23/13  4:27 PM  Result Value Range   MRSA, PCR NEGATIVE  NEGATIVE   Staphylococcus aureus NEGATIVE  NEGATIVE   Comment:            The Xpert SA Assay (FDA     approved for NASAL specimens     in patients over 36 years of age),     is one component of     a comprehensive surveillance     program.  Test performance has     been validated by [employer] for patients greater     than or equal to 8 year old.      It is not intended     to diagnose infection nor to     guide or monitor treatment.    Imaging / Studies: Dg Esophagus W/water Sol Cm  04/25/2013   CLINICAL DATA:  Postop day 1 from a hiatal hernia repair with fundoplication. Evaluate for leak or obstruction.  EXAM: ESOPHOGRAM/BARIUM SWALLOW  TECHNIQUE: Single contrast examination was performed using 50 ml Omnipaque 300.  COMPARISON:  Chest CT 03/12/2013.  FLUOROSCOPY TIME:  29 seconds  FINDINGS: Scout image demonstrates a surgical drain within the posterior mediastinum. The patient swallowed the contrast without difficulty. The esophageal motility appears normal. No residual hernia is identified. Smooth narrowing of the distal esophageal lumen is attributed to the fundoplication. Contrast extends into the stomach. There is no extravasation.  IMPRESSION: Postop hiatal hernia repair with fundoplication. There is no evidence of extravasation or esophageal obstruction.   Electronically Signed   By: Roxy Horseman M.D.   On: 04/25/2013 09:32    Medications / Allergies: per chart  Antibiotics: Anti-infectives   Start     Dose/Rate Route Frequency Ordered Stop   04/24/13 0600  [MAR Hold]  metroNIDAZOLE (FLAGYL) IVPB 500 mg     (On MAR Hold since 04/24/13 0818)  Comments:  Pharmacy may adjust dosing strength, interval, or rate of medication as needed for optimal therapy for the patient Send with patient on call to the OR.  Anesthesia to complete antibiotic administration <28min prior to incision per [facility].   500 mg 100 mL/hr over 60 Minutes Intravenous On call to O.R. 04/23/13 2334 04/24/13 0906   04/24/13 0600  metroNIDAZOLE (FLAGYL) IVPB 500 mg    Comments:  Pharmacy may adjust dosing strength, interval, or rate of medication as needed for optimal therapy for the patient Send with patient on call to the OR.  Anesthesia to complete antibiotic administration <81min prior to incision per [facility].   500 mg 100 mL/hr over 60 Minutes  Intravenous On call to O.R. 04/23/13 2335 04/24/13 0700   04/23/13 2335  ceFAZolin (ANCEF) IVPB 2 g/50 mL premix     2 g 100 mL/hr over 30 Minutes Intravenous 60 min pre-op 04/23/13 2335 04/24/13 0912   04/23/13 0800  ceFAZolin (ANCEF) IVPB 2 g/50 mL premix  Status:  Discontinued     2 g 100 mL/hr over 30 Minutes Intravenous 60 min pre-op 04/23/13 0659 04/23/13 2341   04/23/13 0700  metroNIDAZOLE (FLAGYL) IVPB 500 mg  Status:  Discontinued    Comments:  Pharmacy may adjust dosing strength, interval, or rate of medication as needed for optimal therapy for the patient Send with patient on call to the OR.  Anesthesia to complete antibiotic administration <35min prior to incision per [facility].   500 mg 100 mL/hr over 60 Minutes Intravenous On call to O.R. 04/23/13 4098 04/24/13 1536   04/22/13  1800  fluconazole (DIFLUCAN) tablet 200 mg     200 mg Oral Daily 04/22/13 1749 04/25/13 0959

## 2013-04-26 ENCOUNTER — Telehealth (INDEPENDENT_AMBULATORY_CARE_PROVIDER_SITE_OTHER): Payer: Self-pay

## 2013-04-26 MED ORDER — PROMETHAZINE HCL 12.5 MG PO TABS: 12.5000 mg | ORAL_TABLET | Freq: Four times a day (QID) | ORAL | Status: DC | PRN

## 2013-04-26 NOTE — Discharge Summary (Signed)
Physician Discharge Summary  Patient ID: [name] [name] MRN: [id number] DOB/AGE: 1935/10/12 77 y.o.  Admit date: 04/22/2013 Discharge date: 04/26/2013  Admission Diagnoses:  Discharge Diagnoses:  Principal Problem:   Incarcerated recurrent paraesophageal hiatal hernia Active Problems:   HYPERTENSION   ASTHMA   ESOPHAGEAL STRICTURE   GASTRITIS   Chest pain   Constipation, chronic   Anxiety   Nausea and vomiting   Protein-calorie malnutrition, severe   Discharged Condition: good  Hospital Course: Pt with worsening nausea/vomiting from recurrent PEHiatal hernia with refractory N/V.  Cane to ED.  Admitted.  Stabilized.  Underwent lap repair.  Postoperatively, the patient mobilized in the hallways and advanced to a pureed diet gradually.  Pain was well-controlled and transitioned off IV medications.    By the time of discharge, the patient was walking well the hallways, eating food well, having flatus.  Pain was-controlled on an oral regimen.  Based on meeting DC criteria and recovering well, I felt it was safe for the patient to be discharged home with close followup.  Instructions were discussed in detail.  They are written as well.     Consults: None  Significant Diagnostic Studies:   Dg Esophagus W/water Sol Cm  04/25/2013   CLINICAL DATA:  Postop day 1 from a hiatal hernia repair with fundoplication. Evaluate for leak or obstruction.  EXAM: ESOPHOGRAM/BARIUM SWALLOW  TECHNIQUE: Single contrast examination was performed using 50 ml Omnipaque 300.  COMPARISON:  Chest CT 03/12/2013.  FLUOROSCOPY TIME:  29 seconds  FINDINGS: Scout image demonstrates a surgical drain within the posterior mediastinum. The patient swallowed the contrast without difficulty. The esophageal motility appears normal. No residual hernia is identified. Smooth narrowing of the distal esophageal lumen is attributed to the fundoplication. Contrast extends into the stomach. There is no extravasation.  IMPRESSION:  Postop hiatal hernia repair with fundoplication. There is no evidence of extravasation or esophageal obstruction.   Electronically Signed   By: Roxy Horseman M.D.   On: 04/25/2013 09:32    Results for orders placed during the hospital encounter of 04/22/13 (from the past 72 hour(s))  SURGICAL PCR SCREEN     Status: None   Collection Time    04/23/13  4:27 PM      Result Value Range   MRSA, PCR NEGATIVE  NEGATIVE   Staphylococcus aureus NEGATIVE  NEGATIVE   Comment:            The Xpert SA Assay (FDA     approved for NASAL specimens     in patients over 62 years of age),     is one component of     a comprehensive surveillance     program.  Test performance has     been validated by [employer] for patients greater     than or equal to 3 year old.     It is not intended     to diagnose infection nor to     guide or monitor treatment.     Treatments:   POST-OPERATIVE DIAGNOSIS: RECURRENT Paraesophageal hiatal hernia refractory to medical management   PROCEDURE: Procedure(s):  LAPAROSCOPIC LYSIS OF ADHESIONS x 2hours (1/2 case)  Type II mediastinal dissection.  Laparoscopic reduction of paraesophageal hiatal hernia  Primary repair of hiatal hernia over pledgets.  INSERTION OF MESH UltraPro over crural closure and biologic over the esophageal hiatus  Anterior & posterior gastropexy.  Nissen fundoplication 2cm over a 56-French bougie  SURGEON: Surgeon(s):  Viviann Spare C.  Brendin Situ, MD   Discharge Exam: Blood pressure 151/82, pulse 89, temperature 97.8 F (36.6 C), temperature source Oral, resp. rate 18, height 5\' 3"  (1.6 m), weight 151 lb (68.493 kg), SpO2 94.00%.  General: Pt awake/alert/oriented x4 in no major acute distress Eyes: PERRL, normal EOM. Sclera nonicteric Neuro: CN II-XII intact w/o focal sensory/motor deficits. Lymph: No head/neck/groin lymphadenopathy Psych:  No delerium/psychosis/paranoia.  Smiling, chatty.  In good spirirts HENT: Normocephalic, Mucus  membranes moist.  No thrush Neck: Supple, No tracheal deviation Chest: No pain.  Good respiratory excursion. CV:  Pulses intact.  Regular rhythm MS: Normal AROM mjr joints.  No obvious deformity Abdomen: Soft, Nondistended.  Nontender.  No incarcerated hernias.  Drain seroganguinous Ext:  SCDs BLE.  No significant edema.  No cyanosis Skin: No petechiae / purpura   Disposition: 01-Home or Self Care  Discharge Orders   Future Appointments Provider Department Dept Phone   05/16/2013 11:30 AM Quentin Mulling, PA-C [facility] [phone]   08/14/2013 10:45 AM Lucky Cowboy, MD [place] [facility] [phone]   08/26/2013 9:30 AM [facility] Only [facility] [phone]   08/28/2013 9:30 AM Si Gaul, MD  [facility] [phone]   02/12/2014 10:00 AM Lucky Cowboy, MD [place] [facility] [phone]   Future Orders Complete By Expires   Call MD for:  extreme fatigue  As directed    Call MD for:  extreme fatigue  As directed    Call MD for:  hives  As directed    Call MD for:  hives  As directed    Call MD for:  persistant nausea and vomiting  As directed    Call MD for:  persistant nausea and vomiting  As directed    Call MD for:  redness, tenderness, or signs of infection (pain, swelling, redness, odor or green/yellow discharge around incision site)  As directed    Call MD for:  redness, tenderness, or signs of infection (pain, swelling, redness, odor or green/yellow discharge around incision site)  As directed    Call MD for:  severe uncontrolled pain  As directed    Call MD for:  severe uncontrolled pain  As directed    Call MD for:  As directed    Comments:     Temperature > 101.39F   Call MD for:  As directed    Comments:     Temperature > 101.39F   Discharge instructions  As directed    Comments:     Please see discharge  instruction sheets.  Also refer to handout given an office.  Please call our office if you have any questions or concerns [phone]   Discharge instructions  As directed    Comments:     Please see discharge instruction sheets.  Also refer to handout given an office.  Please call our office if you have any questions or concerns [phone]   Discharge wound care:  As directed    Comments:     If you have closed incisions, shower and bathe over these incisions with soap and water every day.  Remove all surgical dressings on postoperative day #3.  You do not need to replace dressings over the closed incisions unless you feel more comfortable with a Band-Aid covering it.   If you have an open wound that requires packing, please see wound care instructions.  In general, remove all dressings, wash wound with soap  and water and then replace with saline moistened gauze.  Do the dressing change at least every day.  Please call our office [phone] if you have further questions.   Discharge wound care:  As directed    Comments:     If you have closed incisions, shower and bathe over these incisions with soap and water every day.  Remove all surgical dressings on postoperative day #3.  You do not need to replace dressings over the closed incisions unless you feel more comfortable with a Band-Aid covering it.   If you have an open wound that requires packing, please see wound care instructions.  In general, remove all dressings, wash wound with soap and water and then replace with saline moistened gauze.  Do the dressing change at least every day.  Please call our office [phone] if you have further questions.   Driving Restrictions  As directed    Comments:     No driving until off narcotics and can safely swerve away without pain during an emergency   Driving Restrictions  As directed    Comments:     No driving until off narcotics and can safely swerve away without pain during an emergency    Increase activity slowly  As directed    Comments:     Walk an hour a day.  Use 20-30 minute walks.  When you can walk 30 minutes without difficulty, increase to low impact/moderate activities such as biking, jogging, swimming, sexual activity..  Eventually can increase to unrestricted activity when not feeling pain.  If you feel pain: STOP![name]   Let pain protect you from overdoing it.  Use ice/heat/over-the-counter pain medications to help minimize his soreness.  Use pain prescriptions as needed to remain active.  It is better to take extra pain medications and be more active than to stay bedridden to avoid all pain medications.   Increase activity slowly  As directed    Comments:     Walk an hour a day.  Use 20-30 minute walks.  When you can walk 30 minutes without difficulty, increase to low impact/moderate activities such as biking, jogging, swimming, sexual activity..  Eventually can increase to unrestricted activity when not feeling pain.  If you feel pain: STOP![name]   Let pain protect you from overdoing it.  Use ice/heat/over-the-counter pain medications to help minimize his soreness.  Use pain prescriptions as needed to remain active.  It is better to take extra pain medications and be more active than to stay bedridden to avoid all pain medications.   Lifting restrictions  As directed    Comments:     Avoid heavy lifting initially.  Do not push through pain.  You have no specific weight limit.  Coughing and sneezing or four more stressful to your incision than any lifting you will do. Pain will protect you from injury.  Therefore, avoid intense activity until off all narcotic pain medications.  Coughing and sneezing or four more stressful to your incision than any lifting he will do.   Lifting restrictions  As directed    Comments:     Avoid heavy lifting initially.  Do not push through pain.  You have no specific weight limit.  Coughing and sneezing or four more stressful to your incision than  any lifting you will do. Pain will protect you from injury.  Therefore, avoid intense activity until off all narcotic pain medications.  Coughing and sneezing or four more stressful to your incision than any  lifting he will do.   May shower / Bathe  As directed    May shower / Bathe  As directed    May walk up steps  As directed    May walk up steps  As directed    Sexual Activity Restrictions  As directed    Comments:     Sexual activity as tolerated.  Do not push through pain.  Pain will protect you from injury.   Sexual Activity Restrictions  As directed    Comments:     Sexual activity as tolerated.  Do not push through pain.  Pain will protect you from injury.   Walk with assistance  As directed    Comments:     Walk over an hour a day.  May use a walker/cane/companion to help with balance and stamina.   Walk with assistance  As directed    Comments:     Walk over an hour a day.  May use a walker/cane/companion to help with balance and stamina.       Medication List    STOP taking these medications       feeding supplement (RESOURCE BREEZE) Liqd      TAKE these medications       ALPRAZolam 0.5 MG tablet  Commonly known as:  XANAX  Take 0.5 mg by mouth 3 (three) times daily as needed for sleep or anxiety.     amLODipine 10 MG tablet  Commonly known as:  NORVASC  Take 10 mg by mouth every morning.     calcitonin (salmon) 200 UNIT/ACT nasal spray  Commonly known as:  MIACALCIN/FORTICAL  Place 1 spray into the nose every morning. She alternates nostrils every other day.     DSS 100 MG Caps  Take 100 mg by mouth 2 (two) times daily.     HYDROcodone-acetaminophen 7.5-325 MG per tablet  Commonly known as:  NORCO  Take 1-2 tablets by mouth every 4 (four) hours as needed for moderate pain or severe pain.     losartan 100 MG tablet  Commonly known as:  COZAAR  Take 100 mg by mouth every morning.     metoCLOPramide 10 MG tablet  Commonly known as:  REGLAN  Take 1  tablet (10 mg total) by mouth every 6 (six) hours as needed (Nausea).     metoprolol tartrate 25 MG tablet  Commonly known as:  LOPRESSOR  Take 12.5 mg by mouth at bedtime.     mometasone-formoterol 200-5 MCG/ACT Aero  Commonly known as:  DULERA  Inhale 2 puffs into the lungs 2 (two) times daily.     multivitamin with minerals Tabs tablet  Take 1 tablet by mouth every morning.     ondansetron 4 MG tablet  Commonly known as:  ZOFRAN  Take 1 tablet (4 mg total) by mouth every 8 (eight) hours as needed for nausea or vomiting.     OVER THE COUNTER MEDICATION  Take 1,000 mg by mouth every other day. Coral Calcium 1000mg      OVER THE COUNTER MEDICATION  Take 400 mg by mouth 3 (three) times daily. Magnesium Carbonate 400mg      pantoprazole 40 MG tablet  Commonly known as:  PROTONIX  Take 40 mg by mouth 2 (two) times daily.     potassium chloride 10 MEQ tablet  Commonly known as:  K-DUR  Take 1 tablet (10 mEq total) by mouth 2 (two) times daily.     pravastatin 40 MG tablet  Commonly  known as:  PRAVACHOL  Take 40 mg by mouth at bedtime.     promethazine 12.5 MG tablet  Commonly known as:  PHENERGAN  Take 1-2 tablets (12.5-25 mg total) by mouth every 6 (six) hours as needed for nausea or vomiting.     senna 8.6 MG Tabs tablet  Commonly known as:  SENOKOT  Take 1 tablet (8.6 mg total) by mouth daily.     sucralfate 1 G tablet  Commonly known as:  CARAFATE  Take 1 tablet (1 g total) by mouth 4 (four) times daily -  with meals and at bedtime.     TUDORZA PRESSAIR 400 MCG/ACT Aepb  Generic drug:  Aclidinium Bromide  Inhale 1 puff into the lungs 2 (two) times daily.     VITAMIN B 12 PO  Take 1 tablet by mouth every morning.     Vitamin D3 2000 UNITS Tabs  Take 6,000 Units by mouth daily. She takes two tablets in the morning and one tablet at bedtime.           Follow-up Information   Follow up with Devontre Siedschlag C., MD. Schedule an appointment as soon as possible for a  visit in 3 weeks.   Specialty:  General Surgery   Contact information:   [address] [place] [address] [phone]       Follow up with [facility],MD, MD. Schedule an appointment as soon as possible for a visit in 5 days. (Come to office to have bulb drain removed in our [facility] office just before Christmas)    Specialty:  General Surgery   Contact information:   [address] [place] [address] [phone]       Signed: AVEN, CEGIELSKI. 04/26/2013, 6:38 AM

## 2013-04-26 NOTE — Telephone Encounter (Signed)
Patient is home doing well after surgery.  She was told to schedule a drain removal appointment for next week and to see Dr Michaell Cowing in 2 weeks after that.  I scheduled nurse visit for 12/23 at 0900 and to see Dr Michaell Cowing on 05/15/2013.

## 2013-04-26 NOTE — Progress Notes (Signed)
PT NOTE  Order received and chart reviewed.  Pt up in room independently upon arrival.  Pt states she has been walking laps around hallway using RW which she states she has one at home.  Pt does not feel she needs PT at this time and will be discharged home today.  PT to sign off.  Zenovia Jarred, PT, DPT 04/26/2013 Pager: [phone]

## 2013-04-26 NOTE — Progress Notes (Signed)
OT Cancellation Note  Patient Details Name: [name] [name] MRN: [id number] DOB: [birth year]   Cancelled Treatment:    Reason Eval/Treat Not Completed: OT screened, no needs identified, will sign off. Pt states she has no concerns about ADL and husband present and can assist at d/c. Pt has been up walking this am. Has a potty seat and shower seat available. Will sign off.  Lennox Laity [phone] 04/26/2013, 10:37 AM

## 2013-04-26 NOTE — Progress Notes (Signed)
Patient d/c to home with discharge instructions discussed. Home with JP drain. Taught patient & husband how to empty & charge drain and record output on sheet. To carry sheet with her to follow-up MD visit. Patient relayed drain to be d/c'ed in MD office next Tuesday or Wednesday. Patient to take sponge baths until MD visit & prefers abd dgs be removed then.  D/C criteria met...voiding on own, passing flatus, pain controlled with oral prescribed Tylenol and ambulating in room and hallway. Husband packed belongings and was present during d/c instructions. Both voiced readiness to go home.

## 2013-04-27 ENCOUNTER — Other Ambulatory Visit: Payer: Medicare Other

## 2013-04-30 ENCOUNTER — Ambulatory Visit (INDEPENDENT_AMBULATORY_CARE_PROVIDER_SITE_OTHER): Payer: Medicare Other

## 2013-04-30 ENCOUNTER — Encounter (INDEPENDENT_AMBULATORY_CARE_PROVIDER_SITE_OTHER): Payer: Self-pay

## 2013-04-30 VITALS — BP 142/86 | HR 98 | Temp 97.4°F | Resp 18 | Wt 143.4 lb

## 2013-04-30 DIAGNOSIS — Z4803 Encounter for change or removal of drains: Secondary | ICD-10-CM

## 2013-04-30 DIAGNOSIS — Z4889 Encounter for other specified surgical aftercare: Secondary | ICD-10-CM

## 2013-04-30 NOTE — Progress Notes (Signed)
Patient comes in office today for drain removal. Proceeded with suture removal from JP Drain.  Drain output has been 15-20 cc's, proceeded with drain removal.  Patient incisions appear to be healing well.  Patient experienced pain during drain removal.  Patient did start experiencing nausea.  Patient was given a blanket, cold cloth placed on forehead and given ice chips.  Patient had nausea medication in her car, spouse went to retrieve.  Patient took 1 of her Promethazine pills.  Patient remained in 4A until she felt that she was okay to sit up and then have her husband drive her back home.  Alisha help and assisted me with patient.  Patient given post op date & time for 1/71/15 @ 9:15 w/Dr. Michaell Cowing.

## 2013-05-07 ENCOUNTER — Encounter (HOSPITAL_COMMUNITY): Admission: RE | Payer: Self-pay | Source: Ambulatory Visit

## 2013-05-07 ENCOUNTER — Ambulatory Visit (HOSPITAL_COMMUNITY): Admission: RE | Admit: 2013-05-07 | Payer: Medicare Other | Source: Ambulatory Visit | Admitting: Surgery

## 2013-05-07 SURGERY — REPAIR, HERNIA, PARAESOPHAGEAL, LAPAROSCOPIC
Anesthesia: General

## 2013-05-09 DIAGNOSIS — R7989 Other specified abnormal findings of blood chemistry: Secondary | ICD-10-CM

## 2013-05-09 HISTORY — DX: Other specified abnormal findings of blood chemistry: R79.89

## 2013-05-10 ENCOUNTER — Other Ambulatory Visit (INDEPENDENT_AMBULATORY_CARE_PROVIDER_SITE_OTHER): Payer: Self-pay | Admitting: General Surgery

## 2013-05-10 ENCOUNTER — Telehealth (INDEPENDENT_AMBULATORY_CARE_PROVIDER_SITE_OTHER): Payer: Self-pay | Admitting: General Surgery

## 2013-05-10 ENCOUNTER — Telehealth (INDEPENDENT_AMBULATORY_CARE_PROVIDER_SITE_OTHER): Payer: Self-pay

## 2013-05-10 DIAGNOSIS — D472 Monoclonal gammopathy: Secondary | ICD-10-CM

## 2013-05-10 DIAGNOSIS — K449 Diaphragmatic hernia without obstruction or gangrene: Secondary | ICD-10-CM

## 2013-05-10 MED ORDER — HYDROCODONE-ACETAMINOPHEN 7.5-325 MG PO TABS: 1.0000 | ORAL_TABLET | Freq: Four times a day (QID) | ORAL | Status: DC | PRN

## 2013-05-10 NOTE — Telephone Encounter (Signed)
Pt called stating she is afraid her pain med is causing her nausea. She states she ate pureed chicken soup for lunch and took a pain pill. Pt states nausea started after this. I advised her to d/c narcotics and go to clear liquids for today. Pt will continue taking her antiemetic med. Pt to call back if nausea does not improve with these recommendations.

## 2013-05-10 NOTE — Telephone Encounter (Signed)
Pt called for refill of pain meds when informed by pharmacy they cannot refill first prescription.  She has been taking Norco 7.5/ 325 mg and still reports pain at the incision going through to her back.  Please advise.

## 2013-05-10 NOTE — Telephone Encounter (Signed)
Pt's husband called back to confirm that pt should still take her nausea medicine.  I told him yes, but to d/c the pain med per previous telephone call.

## 2013-05-10 NOTE — Telephone Encounter (Signed)
Spoke with Dr. Johney Maine; asked for a partner to approve another Rx for Norco 7.5 / 325 mg, #40 (forty), 1 po Q6H, no refill.  Husband notified to pick up Rx at front desk.

## 2013-05-12 ENCOUNTER — Encounter (HOSPITAL_COMMUNITY): Payer: Self-pay | Admitting: Emergency Medicine

## 2013-05-12 DIAGNOSIS — I10 Essential (primary) hypertension: Secondary | ICD-10-CM | POA: Diagnosis present

## 2013-05-12 DIAGNOSIS — Z8673 Personal history of transient ischemic attack (TIA), and cerebral infarction without residual deficits: Secondary | ICD-10-CM

## 2013-05-12 DIAGNOSIS — D472 Monoclonal gammopathy: Secondary | ICD-10-CM | POA: Diagnosis present

## 2013-05-12 DIAGNOSIS — E876 Hypokalemia: Secondary | ICD-10-CM | POA: Diagnosis present

## 2013-05-12 DIAGNOSIS — F411 Generalized anxiety disorder: Secondary | ICD-10-CM | POA: Diagnosis present

## 2013-05-12 DIAGNOSIS — Z8249 Family history of ischemic heart disease and other diseases of the circulatory system: Secondary | ICD-10-CM

## 2013-05-12 DIAGNOSIS — J45909 Unspecified asthma, uncomplicated: Secondary | ICD-10-CM | POA: Diagnosis present

## 2013-05-12 DIAGNOSIS — E785 Hyperlipidemia, unspecified: Secondary | ICD-10-CM | POA: Diagnosis present

## 2013-05-12 DIAGNOSIS — K219 Gastro-esophageal reflux disease without esophagitis: Secondary | ICD-10-CM | POA: Diagnosis present

## 2013-05-12 DIAGNOSIS — M2460 Ankylosis, unspecified joint: Secondary | ICD-10-CM | POA: Diagnosis present

## 2013-05-12 DIAGNOSIS — R059 Cough, unspecified: Secondary | ICD-10-CM | POA: Diagnosis present

## 2013-05-12 DIAGNOSIS — R05 Cough: Secondary | ICD-10-CM | POA: Diagnosis present

## 2013-05-12 DIAGNOSIS — Z91018 Allergy to other foods: Secondary | ICD-10-CM

## 2013-05-12 DIAGNOSIS — IMO0002 Reserved for concepts with insufficient information to code with codable children: Secondary | ICD-10-CM

## 2013-05-12 DIAGNOSIS — I2510 Atherosclerotic heart disease of native coronary artery without angina pectoris: Secondary | ICD-10-CM | POA: Diagnosis present

## 2013-05-12 DIAGNOSIS — E860 Dehydration: Secondary | ICD-10-CM | POA: Diagnosis present

## 2013-05-12 DIAGNOSIS — Z888 Allergy status to other drugs, medicaments and biological substances status: Secondary | ICD-10-CM

## 2013-05-12 DIAGNOSIS — R110 Nausea: Secondary | ICD-10-CM | POA: Diagnosis present

## 2013-05-12 DIAGNOSIS — E43 Unspecified severe protein-calorie malnutrition: Secondary | ICD-10-CM | POA: Diagnosis present

## 2013-05-12 DIAGNOSIS — R1310 Dysphagia, unspecified: Principal | ICD-10-CM | POA: Diagnosis present

## 2013-05-12 DIAGNOSIS — Z9861 Coronary angioplasty status: Secondary | ICD-10-CM

## 2013-05-12 NOTE — ED Notes (Signed)
Pt reports abd pain and severe nausea since having hiatal hernia surgery 3 weeks ago.  Taking Zofran and Phenergan without relief.  Pt also reports pain in chest tonight.

## 2013-05-13 ENCOUNTER — Encounter (HOSPITAL_COMMUNITY): Payer: Self-pay

## 2013-05-13 ENCOUNTER — Emergency Department (HOSPITAL_COMMUNITY): Payer: Medicare HMO

## 2013-05-13 ENCOUNTER — Inpatient Hospital Stay (HOSPITAL_COMMUNITY)
Admission: EM | Admit: 2013-05-13 | Discharge: 2013-05-17 | DRG: 391 | Disposition: A | Discharge status: Home / Self Care | Payer: Medicare HMO | Attending: Surgery | Admitting: Surgery

## 2013-05-13 DIAGNOSIS — K449 Diaphragmatic hernia without obstruction or gangrene: Secondary | ICD-10-CM

## 2013-05-13 DIAGNOSIS — E860 Dehydration: Secondary | ICD-10-CM

## 2013-05-13 DIAGNOSIS — R110 Nausea: Secondary | ICD-10-CM

## 2013-05-13 DIAGNOSIS — E876 Hypokalemia: Secondary | ICD-10-CM

## 2013-05-13 DIAGNOSIS — K440 Diaphragmatic hernia with obstruction, without gangrene: Secondary | ICD-10-CM | POA: Diagnosis present

## 2013-05-13 DIAGNOSIS — I10 Essential (primary) hypertension: Secondary | ICD-10-CM | POA: Diagnosis present

## 2013-05-13 DIAGNOSIS — D472 Monoclonal gammopathy: Secondary | ICD-10-CM | POA: Diagnosis present

## 2013-05-13 DIAGNOSIS — R1310 Dysphagia, unspecified: Secondary | ICD-10-CM

## 2013-05-13 DIAGNOSIS — J4520 Mild intermittent asthma, uncomplicated: Secondary | ICD-10-CM | POA: Diagnosis present

## 2013-05-13 HISTORY — DX: Diverticulosis of large intestine without perforation or abscess without bleeding: K57.30

## 2013-05-13 LAB — CBC WITH DIFFERENTIAL/PLATELET
Basophils Absolute: 0 10*3/uL (ref 0.0–0.1)
Basophils Relative: 1 % (ref 0–1)
EOS ABS: 0.2 10*3/uL (ref 0.0–0.7)
Eosinophils Relative: 4 % (ref 0–5)
HCT: 40.2 % (ref 36.0–46.0)
HEMOGLOBIN: 14.2 g/dL (ref 12.0–15.0)
LYMPHS ABS: 2.2 10*3/uL (ref 0.7–4.0)
Lymphocytes Relative: 42 % (ref 12–46)
MCH: 30.5 pg (ref 26.0–34.0)
MCHC: 35.3 g/dL (ref 30.0–36.0)
MCV: 86.5 fL (ref 78.0–100.0)
MONOS PCT: 13 % — AB (ref 3–12)
Monocytes Absolute: 0.7 10*3/uL (ref 0.1–1.0)
NEUTROS ABS: 2.1 10*3/uL (ref 1.7–7.7)
Neutrophils Relative %: 40 % — ABNORMAL LOW (ref 43–77)
Platelets: 383 10*3/uL (ref 150–400)
RBC: 4.65 MIL/uL (ref 3.87–5.11)
RDW: 16.8 % — ABNORMAL HIGH (ref 11.5–15.5)
WBC: 5.3 10*3/uL (ref 4.0–10.5)

## 2013-05-13 LAB — COMPREHENSIVE METABOLIC PANEL
ALBUMIN: 3.9 g/dL (ref 3.5–5.2)
ALT: 17 U/L (ref 0–35)
AST: 16 U/L (ref 0–37)
Alkaline Phosphatase: 69 U/L (ref 39–117)
BILIRUBIN TOTAL: 0.8 mg/dL (ref 0.3–1.2)
BUN: 9 mg/dL (ref 6–23)
CO2: 18 meq/L — AB (ref 19–32)
CREATININE: 0.71 mg/dL (ref 0.50–1.10)
Calcium: 9.6 mg/dL (ref 8.4–10.5)
Chloride: 106 mEq/L (ref 96–112)
GFR calc Af Amer: >90 mL/min (ref >90)
GFR calc non Af Amer: 82 mL/min — ABNORMAL LOW (ref >90)
Glucose, Bld: 93 mg/dL (ref 70–99)
Potassium: 3.3 mEq/L — ABNORMAL LOW (ref 3.7–5.3)
Sodium: 143 mEq/L (ref 137–147)
Total Protein: 7 g/dL (ref 6.0–8.3)

## 2013-05-13 LAB — LIPASE, BLOOD: LIPASE: 20 U/L (ref 11–59)

## 2013-05-13 LAB — POCT I-STAT TROPONIN I: Troponin i, poc: 0.01 ng/mL (ref 0.00–0.08)

## 2013-05-13 MED ORDER — PROMETHAZINE HCL 25 MG/ML IJ SOLN
12.5000 mg | Freq: Four times a day (QID) | INTRAMUSCULAR | Status: DC | PRN
  Administered 2013-05-14: 12.5 mg via INTRAVENOUS
  Filled 2013-05-13: qty 1

## 2013-05-13 MED ORDER — MORPHINE SULFATE 4 MG/ML IJ SOLN
4.0000 mg | Freq: Once | INTRAMUSCULAR | Status: AC
  Administered 2013-05-13: 4 mg via INTRAVENOUS
  Filled 2013-05-13: qty 1

## 2013-05-13 MED ORDER — SODIUM CHLORIDE 0.9 % IV BOLUS (SEPSIS)
1000.0000 mL | Freq: Once | INTRAVENOUS | Status: AC
  Administered 2013-05-13: 1000 mL via INTRAVENOUS

## 2013-05-13 MED ORDER — POTASSIUM CHLORIDE 10 MEQ/100ML IV SOLN
10.0000 meq | Freq: Once | INTRAVENOUS | Status: AC
  Administered 2013-05-13: 10 meq via INTRAVENOUS
  Filled 2013-05-13: qty 100

## 2013-05-13 MED ORDER — HEPARIN SODIUM (PORCINE) 5000 UNIT/ML IJ SOLN
5000.0000 [IU] | Freq: Three times a day (TID) | INTRAMUSCULAR | Status: DC
  Administered 2013-05-13 – 2013-05-17 (×11): 5000 [IU] via SUBCUTANEOUS
  Filled 2013-05-13 (×14): qty 1

## 2013-05-13 MED ORDER — DIPHENHYDRAMINE HCL 12.5 MG/5ML PO ELIX: 12.5000 mg | ORAL_SOLUTION | Freq: Four times a day (QID) | ORAL | Status: DC | PRN

## 2013-05-13 MED ORDER — MORPHINE SULFATE 4 MG/ML IJ SOLN: 4.0000 mg | Freq: Once | INTRAMUSCULAR | Status: DC

## 2013-05-13 MED ORDER — ONDANSETRON HCL 4 MG/2ML IJ SOLN
4.0000 mg | Freq: Once | INTRAMUSCULAR | Status: AC
  Administered 2013-05-13: 4 mg via INTRAVENOUS

## 2013-05-13 MED ORDER — ENOXAPARIN SODIUM 40 MG/0.4ML ~~LOC~~ SOLN
40.0000 mg | SUBCUTANEOUS | Status: DC
Start: 2013-05-13 — End: 2013-05-13

## 2013-05-13 MED ORDER — LACTATED RINGERS IV BOLUS (SEPSIS)
1000.0000 mL | Freq: Once | INTRAVENOUS | Status: AC
  Administered 2013-05-13: 1000 mL via INTRAVENOUS

## 2013-05-13 MED ORDER — BISACODYL 10 MG RE SUPP: 10.0000 mg | Freq: Two times a day (BID) | RECTAL | Status: DC | PRN

## 2013-05-13 MED ORDER — ONDANSETRON HCL 4 MG/2ML IJ SOLN
INTRAMUSCULAR | Status: AC
  Administered 2013-05-13: 4 mg via INTRAVENOUS
  Filled 2013-05-13: qty 2

## 2013-05-13 MED ORDER — DIPHENHYDRAMINE HCL 50 MG/ML IJ SOLN: 12.5000 mg | Freq: Four times a day (QID) | INTRAMUSCULAR | Status: DC | PRN

## 2013-05-13 MED ORDER — FENTANYL CITRATE 0.05 MG/ML IJ SOLN: 25.0000 ug | INTRAMUSCULAR | Status: DC | PRN

## 2013-05-13 MED ORDER — IOHEXOL 300 MG/ML  SOLN
25.0000 mL | INTRAMUSCULAR | Status: DC
  Administered 2013-05-13: 25 mL via ORAL

## 2013-05-13 MED ORDER — ONDANSETRON 4 MG PO TBDP
4.0000 mg | ORAL_TABLET | Freq: Four times a day (QID) | ORAL | Status: DC | PRN
  Filled 2013-05-13: qty 2

## 2013-05-13 MED ORDER — DEXAMETHASONE SODIUM PHOSPHATE 10 MG/ML IJ SOLN
10.0000 mg | Freq: Once | INTRAMUSCULAR | Status: AC
  Administered 2013-05-13: 10 mg via INTRAVENOUS
  Filled 2013-05-13: qty 1

## 2013-05-13 MED ORDER — METOCLOPRAMIDE HCL 5 MG/ML IJ SOLN: 5.0000 mg | Freq: Four times a day (QID) | INTRAMUSCULAR | Status: DC | PRN

## 2013-05-13 MED ORDER — MORPHINE SULFATE 2 MG/ML IJ SOLN
INTRAMUSCULAR | Status: AC
  Administered 2013-05-13: 4 mg via INTRAVENOUS
  Filled 2013-05-13: qty 2

## 2013-05-13 MED ORDER — DEXAMETHASONE SODIUM PHOSPHATE 4 MG/ML IJ SOLN
4.0000 mg | Freq: Two times a day (BID) | INTRAMUSCULAR | Status: AC
  Administered 2013-05-14 – 2013-05-16 (×6): 4 mg via INTRAVENOUS
  Filled 2013-05-13 (×6): qty 1

## 2013-05-13 MED ORDER — LIP MEDEX EX OINT
1.0000 "application " | TOPICAL_OINTMENT | Freq: Two times a day (BID) | CUTANEOUS | Status: DC
  Administered 2013-05-13 – 2013-05-17 (×8): 1 via TOPICAL
  Filled 2013-05-13 (×4): qty 7

## 2013-05-13 MED ORDER — PROMETHAZINE HCL 25 MG/ML IJ SOLN
12.5000 mg | Freq: Once | INTRAMUSCULAR | Status: AC
  Administered 2013-05-13: 12.5 mg via INTRAVENOUS
  Filled 2013-05-13: qty 1

## 2013-05-13 MED ORDER — ONDANSETRON HCL 4 MG/2ML IJ SOLN
4.0000 mg | Freq: Four times a day (QID) | INTRAMUSCULAR | Status: DC | PRN
  Administered 2013-05-13 (×2): 4 mg via INTRAVENOUS
  Filled 2013-05-13 (×2): qty 2

## 2013-05-13 MED ORDER — DEXAMETHASONE SODIUM PHOSPHATE 4 MG/ML IJ SOLN
4.0000 mg | Freq: Two times a day (BID) | INTRAMUSCULAR | Status: DC
  Filled 2013-05-13 (×2): qty 1

## 2013-05-13 MED ORDER — KCL IN DEXTROSE-NACL 20-5-0.45 MEQ/L-%-% IV SOLN
INTRAVENOUS | Status: DC
  Administered 2013-05-13 – 2013-05-14 (×3): via INTRAVENOUS
  Filled 2013-05-13 (×5): qty 1000

## 2013-05-13 MED ORDER — MORPHINE SULFATE 2 MG/ML IJ SOLN
1.0000 mg | INTRAMUSCULAR | Status: DC | PRN
  Administered 2013-05-13 – 2013-05-14 (×4): 2 mg via INTRAVENOUS
  Filled 2013-05-13 (×4): qty 1

## 2013-05-13 MED ORDER — ALUM & MAG HYDROXIDE-SIMETH 200-200-20 MG/5ML PO SUSP: 30.0000 mL | Freq: Four times a day (QID) | ORAL | Status: DC | PRN

## 2013-05-13 MED ORDER — LORAZEPAM 2 MG/ML IJ SOLN: 0.5000 mg | Freq: Three times a day (TID) | INTRAMUSCULAR | Status: DC | PRN

## 2013-05-13 MED ORDER — PANTOPRAZOLE SODIUM 40 MG IV SOLR
40.0000 mg | Freq: Every day | INTRAVENOUS | Status: DC
  Filled 2013-05-13: qty 40

## 2013-05-13 MED ORDER — ONDANSETRON HCL 4 MG/2ML IJ SOLN
4.0000 mg | Freq: Once | INTRAMUSCULAR | Status: AC
  Administered 2013-05-13: 4 mg via INTRAVENOUS
  Filled 2013-05-13: qty 2

## 2013-05-13 MED ORDER — MAGIC MOUTHWASH
15.0000 mL | Freq: Four times a day (QID) | ORAL | Status: DC | PRN
  Filled 2013-05-13: qty 15

## 2013-05-13 MED ORDER — PANTOPRAZOLE SODIUM 40 MG IV SOLR
40.0000 mg | Freq: Two times a day (BID) | INTRAVENOUS | Status: DC
  Administered 2013-05-13 – 2013-05-14 (×4): 40 mg via INTRAVENOUS
  Filled 2013-05-13 (×6): qty 40

## 2013-05-13 MED ORDER — ALPRAZOLAM 0.5 MG PO TABS
0.5000 mg | ORAL_TABLET | Freq: Three times a day (TID) | ORAL | Status: DC | PRN
  Administered 2013-05-15: 0.5 mg via ORAL
  Filled 2013-05-13 (×3): qty 1

## 2013-05-13 MED ORDER — IOHEXOL 300 MG/ML  SOLN
100.0000 mL | Freq: Once | INTRAMUSCULAR | Status: AC | PRN
  Administered 2013-05-13: 100 mL via INTRAVENOUS

## 2013-05-13 MED ORDER — LACTATED RINGERS IV BOLUS (SEPSIS): 1000.0000 mL | Freq: Three times a day (TID) | INTRAVENOUS | Status: AC | PRN

## 2013-05-13 MED ORDER — METOPROLOL TARTRATE 1 MG/ML IV SOLN
5.0000 mg | Freq: Four times a day (QID) | INTRAVENOUS | Status: DC | PRN
  Filled 2013-05-13: qty 5

## 2013-05-13 MED ORDER — METOPROLOL TARTRATE 1 MG/ML IV SOLN
5.0000 mg | Freq: Four times a day (QID) | INTRAVENOUS | Status: DC
  Administered 2013-05-13 – 2013-05-14 (×4): 5 mg via INTRAVENOUS
  Filled 2013-05-13 (×8): qty 5

## 2013-05-13 NOTE — ED Notes (Signed)
Notified [facility] for transport to [facility]

## 2013-05-13 NOTE — ED Notes (Signed)
Dr. Darl Householder, [place]  into room.

## 2013-05-13 NOTE — ED Notes (Addendum)
Pharm tech at [facility], pt to xray.

## 2013-05-13 NOTE — ED Notes (Addendum)
Per Claiborne Billings, Surgical PA pt is to be transferred to an admitted bed at [facility]. Pt awaiting admitted bed at [facility].

## 2013-05-13 NOTE — ED Notes (Signed)
Pt up to the restroom. No assistance needed.

## 2013-05-13 NOTE — ED Notes (Signed)
Last ate 1800 pureed peaches, last BM 1300, (h/o cholecystectomy, appendectomy & partial hysterectomy), surgery 3 weeks ago to revise hiatal hernia surgery. Here for nausea and pain, "not suppose to vomit", (denies: fever, vomiting, bleeding, diarrhea or constipation).

## 2013-05-13 NOTE — ED Notes (Signed)
Pending CT, pt finished PO contrast early, CT aware, reports a little nausea and increasing pain, pt sighing and moaning.

## 2013-05-13 NOTE — ED Notes (Signed)
No changes, pt calmer, NAD, to CT.

## 2013-05-13 NOTE — H&P (Signed)
[name] [name] is an 78 y.o. female.   Chief Complaint: nausea HPI:  Patient is a 78 year old female that is around 3 weeks status post redo hiatal hernia repair. This was done by Dr. Johney Maine.  She was doing okay taking her liquids and pured foods until recently. She started having unremitting nausea.  She has been on Reglan, Zofran, and Phenergan, but she has not had relief from this. She has not actually had vomiting.  She denies any significant chest pain. Her main abdominal pain is in the right lower abdomen. She has not had fevers or chills. She has also not had any upper respiratory tract symptoms.  Past Medical History  Diagnosis Date  . CAD (coronary artery disease)     LHC 2003 with 40% pLAD, 30% mLAD, 100% D1 (moderate vessel), 100%  D2 (small vessel), 95% mid small OM2.  Pt had PTCA to D1;  D2 and OM2 small vessels and tx medically;  Last Myoview (2012): anterior infarct seen with scar, no ischemia. EF normal. Pt managed medically;  Lexiscan Myoview (12/14):  Low risk; ant scar with very mild peri-infarct ischemia; EF 55% with ant AK   . Recurrent aspiration bronchitis/pneumonia     PFTs 12/09 FVC 97%, FEV1 101%, ratio 77%, TLC 109%. No significant obstruction or restriction.   . Hypertension   . Hyperlipidemia   . Esophageal stricture   . Diverticulosis of colon   . Gastroesophageal reflux disease with hiatal hernia   . Iron deficiency anemia   . Gastritis   . GERD (gastroesophageal reflux disease)   . Hiatal hernia   . Asthma   . Blood transfusion   . Cough   . Wheezing   . Weakness   . Trouble swallowing   . Abdominal pain   . Hiatal hernia   . Shortness of breath   . Anxiety   . Complication of anesthesia   . PONV (postoperative nausea and vomiting)   . TIA (transient ischemic attack)     Past Surgical History  Procedure Laterality Date  . Back surgery    . Cholecystectomy    . Bladder repair    . Hemorrhoid surgery    . Cholecystectomy      Patient unsure of  date.  [name] Appendectomy      patient unsure of date  . Total abdominal hysterectomy  1975    partial  . Coronary angioplasty  11/16/2001  . Eye surgery  11/2000    bilateral cataracts with lens implant  . Hiatal hernia repair  06/03/2011    Procedure: LAPAROSCOPIC REPAIR OF HIATAL HERNIA;  Surgeon: Adin Hector, MD;  Location: [facility];  Service: General;  Laterality: N/A;  . Laparoscopic nissen fundoplication  07/30/4008    Procedure: LAPAROSCOPIC NISSEN FUNDOPLICATION;  Surgeon: Adin Hector, MD;  Location: [facility];  Service: General;  Laterality: N/A;  . Lumbar laminectomy/decompression microdiscectomy Right 06/26/2012    Procedure: LUMBAR LAMINECTOMY/DECOMPRESSION MICRODISCECTOMY;  Surgeon: Jessy Oto, MD;  Location: [place];  Service: Orthopedics;  Laterality: Right;  RIGHT L4-5 MICRODISCECTOMY  . Esophagogastroduodenoscopy N/A 03/15/2013    Procedure: ESOPHAGOGASTRODUODENOSCOPY (EGD);  Surgeon: Jerene Bears, MD;  Location: [facility];  Service: Endoscopy;  Laterality: N/A;  . Esophageal manometry N/A 03/25/2013    Procedure: ESOPHAGEAL MANOMETRY (EM);  Surgeon: Inda Castle, MD;  Location: [facility];  Service: Endoscopy;  Laterality: N/A;  . Hiatal hernia repair N/A 04/24/2013    Procedure: LAPAROSCOPIC  REPAIR RECURRENT PARASOPHAGEAL HIATAL HERNIA  WITH FUNDOPLICATION;  Surgeon: Adin Hector, MD;  Location: [facility];  Service: General;  Laterality: N/A;  . Insertion of mesh N/A 04/24/2013    Procedure: INSERTION OF MESH;  Surgeon: Adin Hector, MD;  Location: [facility];  Service: General;  Laterality: N/A;  . Laparoscopic lysis of adhesions N/A 04/24/2013    Procedure: LAPAROSCOPIC LYSIS OF ADHESIONS;  Surgeon: Adin Hector, MD;  Location: [facility];  Service: General;  Laterality: N/A;    Family History  Problem Relation Age of Onset  . Cancer Mother     colon   . Colon cancer Mother 52  . Heart disease Father 75    heart attack   Social History:  reports that she  has never smoked. She has never used smokeless tobacco. She reports that she does not drink alcohol or use illicit drugs.  Allergies:  Allergies  Allergen Reactions  . Aspartame Diarrhea and Nausea And Vomiting  . Daliresp [Roflumilast] Nausea And Vomiting  . Ace Inhibitors Cough    Per Dr Melvyn Novas pulmonology 2013     (Not in a hospital admission)  Results for orders placed during the hospital encounter of 05/13/13 (from the past 48 hour(s))  COMPREHENSIVE METABOLIC PANEL     Status: Abnormal   Collection Time    05/12/13 11:35 PM      Result Value Range   Sodium 143  137 - 147 mEq/L   Comment: Please note change in reference range.   Potassium 3.3 (*) 3.7 - 5.3 mEq/L   Comment: Please note change in reference range.   Chloride 106  96 - 112 mEq/L   CO2 18 (*) 19 - 32 mEq/L   Glucose, Bld 93  70 - 99 mg/dL   BUN 9  6 - 23 mg/dL   Creatinine, Ser 0.71  0.50 - 1.10 mg/dL   Calcium 9.6  8.4 - 10.5 mg/dL   Total Protein 7.0  6.0 - 8.3 g/dL   Albumin 3.9  3.5 - 5.2 g/dL   AST 16  0 - 37 U/L   ALT 17  0 - 35 U/L   Alkaline Phosphatase 69  39 - 117 U/L   Total Bilirubin 0.8  0.3 - 1.2 mg/dL   GFR calc non Af Amer 82 (*) >90 mL/min   GFR calc Af Amer >90  >90 mL/min   Comment: (NOTE)     The eGFR has been calculated using the CKD EPI equation.     This calculation has not been validated in all clinical situations.     eGFR's persistently <90 mL/min signify possible Chronic Kidney     Disease.  LIPASE, BLOOD     Status: None   Collection Time    05/12/13 11:35 PM      Result Value Range   Lipase 20  11 - 59 U/L  POCT I-STAT TROPONIN I     Status: None   Collection Time    05/13/13 12:01 AM      Result Value Range   Troponin i, poc 0.01  0.00 - 0.08 ng/mL   Comment 3            Comment: Due to the release kinetics of cTnI,     a negative result within the first hours     of the onset of symptoms does not rule out     myocardial infarction with certainty.     If myocardial  infarction is still suspected,  repeat the test at appropriate intervals.  CBC WITH DIFFERENTIAL     Status: Abnormal   Collection Time    05/13/13  1:45 AM      Result Value Range   WBC 5.3  4.0 - 10.5 K/uL   RBC 4.65  3.87 - 5.11 MIL/uL   Hemoglobin 14.2  12.0 - 15.0 g/dL   HCT 40.2  36.0 - 46.0 %   MCV 86.5  78.0 - 100.0 fL   MCH 30.5  26.0 - 34.0 pg   MCHC 35.3  30.0 - 36.0 g/dL   RDW 16.8 (*) 11.5 - 15.5 %   Platelets 383  150 - 400 K/uL   Neutrophils Relative % 40 (*) 43 - 77 %   Neutro Abs 2.1  1.7 - 7.7 K/uL   Lymphocytes Relative 42  12 - 46 %   Lymphs Abs 2.2  0.7 - 4.0 K/uL   Monocytes Relative 13 (*) 3 - 12 %   Monocytes Absolute 0.7  0.1 - 1.0 K/uL   Eosinophils Relative 4  0 - 5 %   Eosinophils Absolute 0.2  0.0 - 0.7 K/uL   Basophils Relative 1  0 - 1 %   Basophils Absolute 0.0  0.0 - 0.1 K/uL   Ct Chest W Contrast  05/13/2013   CLINICAL DATA:  Nausea and abdominal pain. Hiatal hernia repair 3 weeks prior. Rule out abscess.  EXAM: CT CHEST, ABDOMEN, AND PELVIS WITH CONTRAST  TECHNIQUE: Multidetector CT imaging of the chest, abdomen and pelvis was performed following the standard protocol during bolus administration of intravenous contrast.  CONTRAST:  174m OMNIPAQUE IOHEXOL 300 MG/ML  SOLN  COMPARISON:  Chest CT 03/12/2013.  FINDINGS: CT CHEST FINDINGS  THORACIC INLET/BODY WALL:  No acute abnormality.  MEDIASTINUM:  Normal heart size. No pericardial effusion. Diffuse atherosclerosis, including the coronary arteries. There is suggestion of subendocardial fibro fatty scarring along the LAD distribution, compatible with remote infarct. No evidence of acute vascular abnormality.  Interval repair of recurrent hiatal hernia. There is a tight Nissen wrap with extra luminal high-density material consistent with pledgets (rather than extra luminal contrast). The distal esophagus and gastric wrap is thickened. There is fluid accumulating around the posterior and right lateral  distal esophagus measuring approximately 2 x 1 x 1.5 cm. No well-defined enhancing wall or surrounding fat infiltration.  LUNG WINDOWS:  No consolidation.  No effusion.  No suspicious pulmonary nodule.  OSSEOUS:  No acute fracture.  No suspicious lytic or blastic lesions.  CT ABDOMEN AND PELVIS FINDINGS  BODY WALL: Pelvic floor laxity with rectal prolapse.  Liver: Atrophy of the lateral segment left lobe liver, with intrahepatic biliary ductal dilatation.  Biliary: Cholecystectomy.  Chronic, diffuse biliary ductal dilation.  Pancreas: Unremarkable.  Spleen: No focal abnormality.  Adrenals: Unremarkable.  Kidneys and ureters: No hydronephrosis or stone. Tiny presumed cyst in the interpolar left kidney.  Bladder: Unremarkable.  Reproductive: Hysterectomy.  Bowel: No obstruction.  Retroperitoneum: No mass or adenopathy.  Peritoneum: No free fluid or gas.  Vascular: No acute abnormality.  OSSEOUS: Unchanged linear sclerosis in the left more than right sacral ala, consistent with previous insufficiency fractures. Remote L1 and L3 compression fractures with unchanged height loss greater than 50%. Remote 12 superior endplate fracture. L4-5 anterolisthesis related to advanced facet osteoarthritis. No acute fracture seen  IMPRESSION: 1. Distal esophageal thickening/inflammation status post recent hiatal hernia repair with Nissen fundoplication. There is a 2 x 1 x 1.5 cm fluid collection along the  lower right esophagus which is likely incidental/postoperative, but is sterility indeterminate by imaging. No evidence of esophageal leak. 2. Chronic intra-abdominal and intrathoracic findings are stable from recent CT comparisons, described above.   Electronically Signed   By: Jorje Guild M.D.   On: 05/13/2013 07:13   Ct Abdomen Pelvis W Contrast  05/13/2013   CLINICAL DATA:  Nausea and abdominal pain. Hiatal hernia repair 3 weeks prior. Rule out abscess.  EXAM: CT CHEST, ABDOMEN, AND PELVIS WITH CONTRAST  TECHNIQUE:  Multidetector CT imaging of the chest, abdomen and pelvis was performed following the standard protocol during bolus administration of intravenous contrast.  CONTRAST:  153m OMNIPAQUE IOHEXOL 300 MG/ML  SOLN  COMPARISON:  Chest CT 03/12/2013.  FINDINGS: CT CHEST FINDINGS  THORACIC INLET/BODY WALL:  No acute abnormality.  MEDIASTINUM:  Normal heart size. No pericardial effusion. Diffuse atherosclerosis, including the coronary arteries. There is suggestion of subendocardial fibro fatty scarring along the LAD distribution, compatible with remote infarct. No evidence of acute vascular abnormality.  Interval repair of recurrent hiatal hernia. There is a tight Nissen wrap with extra luminal high-density material consistent with pledgets (rather than extra luminal contrast). The distal esophagus and gastric wrap is thickened. There is fluid accumulating around the posterior and right lateral distal esophagus measuring approximately 2 x 1 x 1.5 cm. No well-defined enhancing wall or surrounding fat infiltration.  LUNG WINDOWS:  No consolidation.  No effusion.  No suspicious pulmonary nodule.  OSSEOUS:  No acute fracture.  No suspicious lytic or blastic lesions.  CT ABDOMEN AND PELVIS FINDINGS  BODY WALL: Pelvic floor laxity with rectal prolapse.  Liver: Atrophy of the lateral segment left lobe liver, with intrahepatic biliary ductal dilatation.  Biliary: Cholecystectomy.  Chronic, diffuse biliary ductal dilation.  Pancreas: Unremarkable.  Spleen: No focal abnormality.  Adrenals: Unremarkable.  Kidneys and ureters: No hydronephrosis or stone. Tiny presumed cyst in the interpolar left kidney.  Bladder: Unremarkable.  Reproductive: Hysterectomy.  Bowel: No obstruction.  Retroperitoneum: No mass or adenopathy.  Peritoneum: No free fluid or gas.  Vascular: No acute abnormality.  OSSEOUS: Unchanged linear sclerosis in the left more than right sacral ala, consistent with previous insufficiency fractures. Remote L1 and L3  compression fractures with unchanged height loss greater than 50%. Remote 12 superior endplate fracture. L4-5 anterolisthesis related to advanced facet osteoarthritis. No acute fracture seen  IMPRESSION: 1. Distal esophageal thickening/inflammation status post recent hiatal hernia repair with Nissen fundoplication. There is a 2 x 1 x 1.5 cm fluid collection along the lower right esophagus which is likely incidental/postoperative, but is sterility indeterminate by imaging. No evidence of esophageal leak. 2. Chronic intra-abdominal and intrathoracic findings are stable from recent CT comparisons, described above.   Electronically Signed   By: JJorje GuildM.D.   On: 05/13/2013 07:13   Dg Abd Acute W/chest  05/13/2013   CLINICAL DATA:  Right lower quadrant pain, recent hiatal hernia fundoplication.  EXAM: ACUTE ABDOMEN SERIES (ABDOMEN 2 VIEW & CHEST 1 VIEW)  COMPARISON:  Acute abdominal series April 22, 2013  FINDINGS: Cardiomediastinal silhouette is nonsuspicious, unchanged. Mild chronic interstitial changes with scarring in left lung base. No pleural effusions or focal consolidations. No pneumothorax.  Bowel gas pattern is nondilated and nonobstructive. Surgical clips in the right abdomen likely reflect cholecystectomy. Surgical clips at the gastroesophageal junction. No intra-abdominal mass effect or pathologic calcifications. No free air. Phleboliths in the pelvis. Patient is osteopenic. Mild levoscoliosis of the lumbar spine is unchanged. Mild L1 compression fracture  may be new, chronic L3 moderate to severe compression deformity.  IMPRESSION: No acute cardiopulmonary process.  Nonspecific bowel gas pattern.  Osteopenia, chronic L3 moderate compression fracture with new appearing L1 compression fracture, recommend correlation with point tenderness.   Electronically Signed   By: Elon Alas   On: 05/13/2013 02:03    Review of Systems  HENT: Negative.   Eyes: Negative.   Respiratory: Negative.    Cardiovascular: Negative.   Gastrointestinal: Positive for nausea and abdominal pain (RLQ).  Genitourinary: Negative.   Musculoskeletal: Negative.   Skin: Negative.   Neurological: Positive for weakness.  Endo/Heme/Allergies: Negative.   Psychiatric/Behavioral: Negative.     Blood pressure 144/70, pulse 108, temperature 98.7 F (37.1 C), temperature source Oral, resp. rate 18, SpO2 97.00%. Physical Exam  Constitutional: She is oriented to person, place, and time. She appears well-developed and well-nourished. She appears distressed (mild).  HENT:  Head: Normocephalic and atraumatic.  Eyes: Conjunctivae are normal. Pupils are equal, round, and reactive to light. Right eye exhibits no discharge. Left eye exhibits no discharge. No scleral icterus.  Neck: Normal range of motion. No thyromegaly present.  Cardiovascular: Normal rate, regular rhythm, normal heart sounds and intact distal pulses.  Exam reveals no gallop and no friction rub.   No murmur heard. Respiratory: Effort normal and breath sounds normal. No respiratory distress. She has no wheezes. She has no rales. She exhibits no tenderness.  GI: Soft. She exhibits no distension and no mass. There is tenderness (RLQ). There is no rebound and no guarding.  Musculoskeletal: She exhibits no edema.  Neurological: She is alert and oriented to person, place, and time. Coordination normal.  Skin: Skin is warm and dry. No rash noted. She is not diaphoretic. No erythema. No pallor.  Psychiatric: She has a normal mood and affect. Her behavior is normal. Judgment and thought content normal.     Assessment/Plan Postoperative nausea I am not sure of the cause of the nausea. I suspect that this is related to impaired gastric motility either from the surgery or from a combination of all of her stomach surgery.  Discussed with Dr. Johney Maine.  Will admit for rehydration.  Will add decadron for inflammation.  Swallow study tomorrow.   Will need to go  to [name].    She also is dehydrated and hypokalemic. We will replete the fluid as well as the potassium.  [name] [name] 05/13/2013, 7:37 AM

## 2013-05-13 NOTE — ED Notes (Signed)
Dr. Barry Dienes into room

## 2013-05-13 NOTE — ED Provider Notes (Addendum)
CSN: [id number]     Arrival date & time 05/12/13  2327 History   First MD Initiated Contact with Patient 05/13/13 0053     Chief Complaint  Patient presents with  . Nausea  . Abdominal Pain  . Chest Pain   (Consider location/radiation/quality/duration/timing/severity/associated sxs/prior Treatment) The history is provided by the patient.  Jamelyn Bovard is a 78 y.o. female hx of CAD, HTN, pneumonia, hiatal hernia here with nausea and abdominal pain and chest pain. She had a revision of the hiatal hernia 3 weeks ago. She's been nauseated since then but the nausea got worse. She has been taking Zofran and Phenergan and Reglan without relief. Today she starts having some pain in her chest. Denies shortness of breath.    Past Medical History  Diagnosis Date  . CAD (coronary artery disease)     LHC 2003 with 40% pLAD, 30% mLAD, 100% D1 (moderate vessel), 100%  D2 (small vessel), 95% mid small OM2.  Pt had PTCA to D1;  D2 and OM2 small vessels and tx medically;  Last Myoview (2012): anterior infarct seen with scar, no ischemia. EF normal. Pt managed medically;  Lexiscan Myoview (12/14):  Low risk; ant scar with very mild peri-infarct ischemia; EF 55% with ant AK   . Recurrent aspiration bronchitis/pneumonia     PFTs 12/09 FVC 97%, FEV1 101%, ratio 77%, TLC 109%. No significant obstruction or restriction.   . Hypertension   . Hyperlipidemia   . Esophageal stricture   . Diverticulosis of colon   . Gastroesophageal reflux disease with hiatal hernia   . Iron deficiency anemia   . Gastritis   . GERD (gastroesophageal reflux disease)   . Hiatal hernia   . Asthma   . Blood transfusion   . Cough   . Wheezing   . Weakness   . Trouble swallowing   . Abdominal pain   . Hiatal hernia   . Shortness of breath   . Anxiety   . Complication of anesthesia   . PONV (postoperative nausea and vomiting)   . TIA (transient ischemic attack)    Past Surgical History  Procedure Laterality Date  . Back  surgery    . Cholecystectomy    . Bladder repair    . Hemorrhoid surgery    . Cholecystectomy      Patient unsure of date.  [name] Appendectomy      patient unsure of date  . Total abdominal hysterectomy  1975    partial  . Coronary angioplasty  11/16/2001  . Eye surgery  11/2000    bilateral cataracts with lens implant  . Hiatal hernia repair  06/03/2011    Procedure: LAPAROSCOPIC REPAIR OF HIATAL HERNIA;  Surgeon: Adin Hector, MD;  Location: [facility];  Service: General;  Laterality: N/A;  . Laparoscopic nissen fundoplication  08/09/4740    Procedure: LAPAROSCOPIC NISSEN FUNDOPLICATION;  Surgeon: Adin Hector, MD;  Location: [facility];  Service: General;  Laterality: N/A;  . Lumbar laminectomy/decompression microdiscectomy Right 06/26/2012    Procedure: LUMBAR LAMINECTOMY/DECOMPRESSION MICRODISCECTOMY;  Surgeon: Jessy Oto, MD;  Location: [facility];  Service: Orthopedics;  Laterality: Right;  RIGHT L4-5 MICRODISCECTOMY  . Esophagogastroduodenoscopy N/A 03/15/2013    Procedure: ESOPHAGOGASTRODUODENOSCOPY (EGD);  Surgeon: Jerene Bears, MD;  Location: [facility];  Service: Endoscopy;  Laterality: N/A;  . Esophageal manometry N/A 03/25/2013    Procedure: ESOPHAGEAL MANOMETRY (EM);  Surgeon: Inda Castle, MD;  Location: [facility];  Service: Endoscopy;  Laterality: N/A;  .  Hiatal hernia repair N/A 04/24/2013    Procedure: LAPAROSCOPIC  REPAIR RECURRENT PARASOPHAGEAL HIATAL HERNIA WITH FUNDOPLICATION;  Surgeon: Adin Hector, MD;  Location: [facility];  Service: General;  Laterality: N/A;  . Insertion of mesh N/A 04/24/2013    Procedure: INSERTION OF MESH;  Surgeon: Adin Hector, MD;  Location: [facility];  Service: General;  Laterality: N/A;  . Laparoscopic lysis of adhesions N/A 04/24/2013    Procedure: LAPAROSCOPIC LYSIS OF ADHESIONS;  Surgeon: Adin Hector, MD;  Location: [facility];  Service: General;  Laterality: N/A;   Family History  Problem Relation Age of Onset  . Cancer Mother      colon   . Colon cancer Mother 55  . Heart disease Father 12    heart attack   History  Substance Use Topics  . Smoking status: Never Smoker   . Smokeless tobacco: Never Used  . Alcohol Use: No   OB History   Grav Para Term Preterm Abortions TAB SAB Ect Mult Living                 Review of Systems  Cardiovascular: Positive for chest pain.  Gastrointestinal: Positive for nausea and abdominal pain.  All other systems reviewed and are negative.    Allergies  Aspartame; Daliresp; and Ace inhibitors  Home Medications   Current Outpatient Rx  Name  Route  Sig  Dispense  Refill  . Aclidinium Bromide (TUDORZA PRESSAIR) 400 MCG/ACT AEPB   Inhalation   Inhale 1 puff into the lungs 2 (two) times daily.         [name] ALPRAZolam (XANAX) 0.5 MG tablet   Oral   Take 0.5 mg by mouth 3 (three) times daily as needed for sleep or anxiety.          [name] amLODipine (NORVASC) 10 MG tablet   Oral   Take 10 mg by mouth every morning.          . calcitonin, salmon, (MIACALCIN/FORTICAL) 200 UNIT/ACT nasal spray   Nasal   Place 1 spray into the nose every morning. She alternates nostrils every other day.         . Cholecalciferol (VITAMIN D-3) 1000 UNITS CAPS   Oral   Take 1 capsule by mouth 2 (two) times daily. Take with 2,000 unit to = 3,000 units/each dose         . Cholecalciferol (VITAMIN D3) 2000 UNITS TABS   Oral   Take 1 tablet by mouth 2 (two) times daily. Take with 1,000 unit to = 3,000 units/dose         . Cyanocobalamin (VITAMIN B 12 PO)   Oral   Take 1 tablet by mouth every morning.          . docusate sodium 100 MG CAPS   Oral   Take 100 mg by mouth 2 (two) times daily.   10 capsule   0   . losartan (COZAAR) 100 MG tablet   Oral   Take 100 mg by mouth every morning.          . metoCLOPramide (REGLAN) 10 MG tablet   Oral   Take 1 tablet (10 mg total) by mouth every 6 (six) hours as needed (Nausea).   20 tablet   0   . metoprolol tartrate  (LOPRESSOR) 25 MG tablet   Oral   Take 12.5 mg by mouth at bedtime.         . mometasone-formoterol (DULERA) 200-5 MCG/ACT AERO  Inhalation   Inhale 2 puffs into the lungs 2 (two) times daily.         . Multiple Vitamin (MULTIVITAMIN WITH MINERALS) TABS   Oral   Take 1 tablet by mouth every morning.          . ondansetron (ZOFRAN) 4 MG tablet   Oral   Take 1 tablet (4 mg total) by mouth every 8 (eight) hours as needed for nausea or vomiting.   36 tablet   5   . OVER THE COUNTER MEDICATION   Oral   Take 1,000 mg by mouth every other day. [place]al Calcium 1000mg          . OVER THE COUNTER MEDICATION   Oral   Take 400 mg by mouth 3 (three) times daily. Magnesium Carbonate 400mg          . pantoprazole (PROTONIX) 40 MG tablet   Oral   Take 40 mg by mouth 2 (two) times daily.         . potassium chloride (K-DUR) 10 MEQ tablet   Oral   Take 1 tablet (10 mEq total) by mouth 2 (two) times daily.   60 tablet   1   . pravastatin (PRAVACHOL) 40 MG tablet   Oral   Take 40 mg by mouth at bedtime.          . promethazine (PHENERGAN) 12.5 MG tablet   Oral   Take 1-2 tablets (12.5-25 mg total) by mouth every 6 (six) hours as needed for nausea or vomiting.   15 tablet   5   . senna (SENOKOT) 8.6 MG TABS tablet   Oral   Take 1 tablet (8.6 mg total) by mouth daily.   120 each   0   . sucralfate (CARAFATE) 1 G tablet   Oral   Take 1 g by mouth 4 (four) times daily -  with meals and at bedtime.          BP 147/77  Pulse 99  Temp(Src) 98.7 F (37.1 C) (Oral)  Resp 18  SpO2 100% Physical Exam  Nursing note and vitals reviewed. Constitutional: She is oriented to person, place, and time.  Chronically ill, uncomfortable   HENT:  Head: Normocephalic.  MM dry   Eyes: Conjunctivae are normal. Pupils are equal, round, and reactive to light.  Neck: Normal range of motion. Neck supple.  Cardiovascular: Normal rate, regular rhythm and normal heart sounds.    Pulmonary/Chest: Effort normal and breath sounds normal. No respiratory distress. She has no wheezes. She has no rales.  Abdominal: Soft. Bowel sounds are normal.  + epigastric tenderness, no rebound   Musculoskeletal: Normal range of motion.  Neurological: She is alert and oriented to person, place, and time.  Skin: Skin is warm and dry.  Psychiatric: She has a normal mood and affect. Her behavior is normal. Judgment and thought content normal.    ED Course  Procedures (including critical care time) Labs Review Labs Reviewed  COMPREHENSIVE METABOLIC PANEL - Abnormal; Notable for the following:    Potassium 3.3 (*)    CO2 18 (*)    GFR calc non Af Amer 82 (*)    All other components within normal limits  CBC WITH DIFFERENTIAL - Abnormal; Notable for the following:    RDW 16.8 (*)    Neutrophils Relative % 40 (*)    Monocytes Relative 13 (*)    All other components within normal limits  LIPASE, BLOOD  CBC WITH DIFFERENTIAL  POCT I-STAT TROPONIN I   Imaging Review Ct Chest W Contrast  05/13/2013   CLINICAL DATA:  Nausea and abdominal pain. Hiatal hernia repair 3 weeks prior. Rule out abscess.  EXAM: CT CHEST, ABDOMEN, AND PELVIS WITH CONTRAST  TECHNIQUE: Multidetector CT imaging of the chest, abdomen and pelvis was performed following the standard protocol during bolus administration of intravenous contrast.  CONTRAST:  161mL OMNIPAQUE IOHEXOL 300 MG/ML  SOLN  COMPARISON:  Chest CT 03/12/2013.  FINDINGS: CT CHEST FINDINGS  THORACIC INLET/BODY WALL:  No acute abnormality.  MEDIASTINUM:  Normal heart size. No pericardial effusion. Diffuse atherosclerosis, including the coronary arteries. There is suggestion of subendocardial fibro fatty scarring along the LAD distribution, compatible with remote infarct. No evidence of acute vascular abnormality.  Interval repair of recurrent hiatal hernia. There is a tight Nissen wrap with extra luminal high-density material consistent with pledgets  (rather than extra luminal contrast). The distal esophagus and gastric wrap is thickened. There is fluid accumulating around the posterior and right lateral distal esophagus measuring approximately 2 x 1 x 1.5 cm. No well-defined enhancing wall or surrounding fat infiltration.  LUNG WINDOWS:  No consolidation.  No effusion.  No suspicious pulmonary nodule.  OSSEOUS:  No acute fracture.  No suspicious lytic or blastic lesions.  CT ABDOMEN AND PELVIS FINDINGS  BODY WALL: Pelvic floor laxity with rectal prolapse.  Liver: Atrophy of the lateral segment left lobe liver, with intrahepatic biliary ductal dilatation.  Biliary: Cholecystectomy.  Chronic, diffuse biliary ductal dilation.  Pancreas: Unremarkable.  Spleen: No focal abnormality.  Adrenals: Unremarkable.  Kidneys and ureters: No hydronephrosis or stone. Tiny presumed cyst in the interpolar left kidney.  Bladder: Unremarkable.  Reproductive: Hysterectomy.  Bowel: No obstruction.  Retroperitoneum: No mass or adenopathy.  Peritoneum: No free fluid or gas.  Vascular: No acute abnormality.  OSSEOUS: Unchanged linear sclerosis in the left more than right sacral ala, consistent with previous insufficiency fractures. Remote L1 and L3 compression fractures with unchanged height loss greater than 50%. Remote 12 superior endplate fracture. L4-5 anterolisthesis related to advanced facet osteoarthritis. No acute fracture seen  IMPRESSION: 1. Distal esophageal thickening/inflammation status post recent hiatal hernia repair with Nissen fundoplication. There is a 2 x 1 x 1.5 cm fluid collection along the lower right esophagus which is likely incidental/postoperative, but is sterility indeterminate by imaging. No evidence of esophageal leak. 2. Chronic intra-abdominal and intrathoracic findings are stable from recent CT comparisons, described above.   Electronically Signed   By: Jorje Guild M.D.   On: 05/13/2013 07:13   Ct Abdomen Pelvis W Contrast  05/13/2013   CLINICAL  DATA:  Nausea and abdominal pain. Hiatal hernia repair 3 weeks prior. Rule out abscess.  EXAM: CT CHEST, ABDOMEN, AND PELVIS WITH CONTRAST  TECHNIQUE: Multidetector CT imaging of the chest, abdomen and pelvis was performed following the standard protocol during bolus administration of intravenous contrast.  CONTRAST:  161mL OMNIPAQUE IOHEXOL 300 MG/ML  SOLN  COMPARISON:  Chest CT 03/12/2013.  FINDINGS: CT CHEST FINDINGS  THORACIC INLET/BODY WALL:  No acute abnormality.  MEDIASTINUM:  Normal heart size. No pericardial effusion. Diffuse atherosclerosis, including the coronary arteries. There is suggestion of subendocardial fibro fatty scarring along the LAD distribution, compatible with remote infarct. No evidence of acute vascular abnormality.  Interval repair of recurrent hiatal hernia. There is a tight Nissen wrap with extra luminal high-density material consistent with pledgets (rather than extra luminal contrast). The distal esophagus and gastric wrap is thickened. There is fluid  accumulating around the posterior and right lateral distal esophagus measuring approximately 2 x 1 x 1.5 cm. No well-defined enhancing wall or surrounding fat infiltration.  LUNG WINDOWS:  No consolidation.  No effusion.  No suspicious pulmonary nodule.  OSSEOUS:  No acute fracture.  No suspicious lytic or blastic lesions.  CT ABDOMEN AND PELVIS FINDINGS  BODY WALL: Pelvic floor laxity with rectal prolapse.  Liver: Atrophy of the lateral segment left lobe liver, with intrahepatic biliary ductal dilatation.  Biliary: Cholecystectomy.  Chronic, diffuse biliary ductal dilation.  Pancreas: Unremarkable.  Spleen: No focal abnormality.  Adrenals: Unremarkable.  Kidneys and ureters: No hydronephrosis or stone. Tiny presumed cyst in the interpolar left kidney.  Bladder: Unremarkable.  Reproductive: Hysterectomy.  Bowel: No obstruction.  Retroperitoneum: No mass or adenopathy.  Peritoneum: No free fluid or gas.  Vascular: No acute abnormality.   OSSEOUS: Unchanged linear sclerosis in the left more than right sacral ala, consistent with previous insufficiency fractures. Remote L1 and L3 compression fractures with unchanged height loss greater than 50%. Remote 12 superior endplate fracture. L4-5 anterolisthesis related to advanced facet osteoarthritis. No acute fracture seen  IMPRESSION: 1. Distal esophageal thickening/inflammation status post recent hiatal hernia repair with Nissen fundoplication. There is a 2 x 1 x 1.5 cm fluid collection along the lower right esophagus which is likely incidental/postoperative, but is sterility indeterminate by imaging. No evidence of esophageal leak. 2. Chronic intra-abdominal and intrathoracic findings are stable from recent CT comparisons, described above.   Electronically Signed   By: Jorje Guild M.D.   On: 05/13/2013 07:13   Dg Abd Acute W/chest  05/13/2013   CLINICAL DATA:  Right lower quadrant pain, recent hiatal hernia fundoplication.  EXAM: ACUTE ABDOMEN SERIES (ABDOMEN 2 VIEW & CHEST 1 VIEW)  COMPARISON:  Acute abdominal series April 22, 2013  FINDINGS: Cardiomediastinal silhouette is nonsuspicious, unchanged. Mild chronic interstitial changes with scarring in left lung base. No pleural effusions or focal consolidations. No pneumothorax.  Bowel gas pattern is nondilated and nonobstructive. Surgical clips in the right abdomen likely reflect cholecystectomy. Surgical clips at the gastroesophageal junction. No intra-abdominal mass effect or pathologic calcifications. No free air. Phleboliths in the pelvis. Patient is osteopenic. Mild levoscoliosis of the lumbar spine is unchanged. Mild L1 compression fracture may be new, chronic L3 moderate to severe compression deformity.  IMPRESSION: No acute cardiopulmonary process.  Nonspecific bowel gas pattern.  Osteopenia, chronic L3 moderate compression fracture with new appearing L1 compression fracture, recommend correlation with point tenderness.   Electronically  Signed   By: Elon Alas   On: 05/13/2013 02:03    EKG Interpretation    Date/Time:  Sunday May 12 2013 23:40:11 EST Ventricular Rate:  93 PR Interval:  136 QRS Duration: 88 QT Interval:  366 QTC Calculation: 455 R Axis:   -21 Text Interpretation:  Normal sinus rhythm Low voltage QRS Septal infarct , age undetermined Lateral infarct , age undetermined Abnormal ECG No significant change since last tracing Confirmed by YAO  MD, DAVID [phone]) on 05/13/2013 1:05:45 AM            MDM   1. Nausea   2. Dehydration   3. Hypokalemia   4. Hiatal hernia    Anntionette Ivanov is a 78 y.o. female here with ab pain, nausea, chest pain. Chest pain likely from hiatal hernia vs reflux vs aspiration. I doubt ACS. Nausea and ab pain likely from hiatal hernia. Will get labs, repeat xray and call surgery.   6:30 AM  Dr. Barry Dienes saw patient. Recommend CT chest/ab/pel to look for leaks. She is still nauseous and will likely need admission for hydration.   7:30 AM CT showed fluid collection along [place]. Surgery will admit and transfer to [facility].    Wandra Arthurs, MD 05/13/13 [place], MD 05/13/13 [place] Eva  Wandra Arthurs, MD 05/13/13 0930

## 2013-05-14 ENCOUNTER — Observation Stay (HOSPITAL_COMMUNITY): Payer: Medicare HMO

## 2013-05-14 DIAGNOSIS — R059 Cough, unspecified: Secondary | ICD-10-CM

## 2013-05-14 DIAGNOSIS — I10 Essential (primary) hypertension: Secondary | ICD-10-CM

## 2013-05-14 DIAGNOSIS — R05 Cough: Secondary | ICD-10-CM

## 2013-05-14 DIAGNOSIS — F411 Generalized anxiety disorder: Secondary | ICD-10-CM

## 2013-05-14 LAB — BASIC METABOLIC PANEL
BUN: 4 mg/dL — AB (ref 6–23)
CALCIUM: 9.5 mg/dL (ref 8.4–10.5)
CO2: 22 meq/L (ref 19–32)
CREATININE: 0.61 mg/dL (ref 0.50–1.10)
Chloride: 104 mEq/L (ref 96–112)
GFR calc Af Amer: >90 mL/min (ref >90)
GFR calc non Af Amer: 87 mL/min — ABNORMAL LOW (ref >90)
Glucose, Bld: 158 mg/dL — ABNORMAL HIGH (ref 70–99)
Potassium: 3.9 mEq/L (ref 3.7–5.3)
Sodium: 141 mEq/L (ref 137–147)

## 2013-05-14 LAB — CBC
HEMATOCRIT: 38.4 % (ref 36.0–46.0)
Hemoglobin: 13 g/dL (ref 12.0–15.0)
MCH: 29.7 pg (ref 26.0–34.0)
MCHC: 33.9 g/dL (ref 30.0–36.0)
MCV: 87.9 fL (ref 78.0–100.0)
PLATELETS: 355 10*3/uL (ref 150–400)
RBC: 4.37 MIL/uL (ref 3.87–5.11)
RDW: 17.2 % — AB (ref 11.5–15.5)
WBC: 2.7 10*3/uL — ABNORMAL LOW (ref 4.0–10.5)

## 2013-05-14 LAB — GLUCOSE, CAPILLARY: Glucose-Capillary: 134 mg/dL — ABNORMAL HIGH (ref 70–99)

## 2013-05-14 MED ORDER — IOHEXOL 300 MG/ML  SOLN
150.0000 mL | Freq: Once | INTRAMUSCULAR | Status: AC | PRN
  Administered 2013-05-14: 70 mL via ORAL

## 2013-05-14 MED ORDER — ONDANSETRON HCL 4 MG PO TABS
4.0000 mg | ORAL_TABLET | Freq: Three times a day (TID) | ORAL | Status: DC
  Administered 2013-05-14 – 2013-05-15 (×7): 4 mg via ORAL
  Filled 2013-05-14 (×14): qty 1

## 2013-05-14 MED ORDER — ZOLPIDEM TARTRATE 5 MG PO TABS: 5.0000 mg | ORAL_TABLET | Freq: Every evening | ORAL | Status: DC | PRN

## 2013-05-14 MED ORDER — LOSARTAN POTASSIUM 50 MG PO TABS: 100.0000 mg | ORAL_TABLET | Freq: Every morning | ORAL | Status: DC

## 2013-05-14 MED ORDER — AMLODIPINE BESYLATE 10 MG PO TABS
10.0000 mg | ORAL_TABLET | Freq: Every morning | ORAL | Status: DC
  Administered 2013-05-14 – 2013-05-17 (×4): 10 mg via ORAL
  Filled 2013-05-14 (×4): qty 1

## 2013-05-14 MED ORDER — TIOTROPIUM BROMIDE MONOHYDRATE 18 MCG IN CAPS
18.0000 ug | ORAL_CAPSULE | Freq: Every day | RESPIRATORY_TRACT | Status: DC
  Administered 2013-05-14 – 2013-05-17 (×4): 18 ug via RESPIRATORY_TRACT
  Filled 2013-05-14: qty 5

## 2013-05-14 MED ORDER — TRAMADOL HCL 50 MG PO TABS
50.0000 mg | ORAL_TABLET | Freq: Four times a day (QID) | ORAL | Status: DC | PRN
  Administered 2013-05-16 – 2013-05-17 (×2): 100 mg via ORAL
  Filled 2013-05-14 (×2): qty 2

## 2013-05-14 MED ORDER — BOOST / RESOURCE BREEZE PO LIQD
1.0000 | Freq: Two times a day (BID) | ORAL | Status: DC
  Administered 2013-05-14 – 2013-05-16 (×5): 1 via ORAL

## 2013-05-14 MED ORDER — ACETAMINOPHEN 325 MG PO TABS
650.0000 mg | ORAL_TABLET | Freq: Three times a day (TID) | ORAL | Status: DC
  Administered 2013-05-14 – 2013-05-15 (×6): 650 mg via ORAL
  Filled 2013-05-14 (×10): qty 2

## 2013-05-14 MED ORDER — LACTATED RINGERS IV SOLN: INTRAVENOUS | Status: DC

## 2013-05-14 NOTE — Progress Notes (Signed)
[name] [name] [id number] 02-May-1936  CARE TEAM:  PCP: Alesia Richards, MD  Outpatient Care Team: Patient Care Team: [name], MD as PCP - General (Internal Medicine) Inda Castle, MD as Consulting Physician (Gastroenterology) Rigoberto Noel, MD as Consulting Physician (Pulmonary Disease) Larey Dresser, MD as Consulting Physician (Cardiology) Adin Hector, MD as Consulting Physician (General Surgery) Jessy Oto, MD as Consulting Physician (Orthopedic Surgery)  Inpatient Treatment Team: Treatment Team: Attending Provider: Adin Hector, MD; Registered Nurse: Ethelene Hal, RN; Technician: Senaida Ores Debbink, NT   Subjective:  Feels better Husband in room Mild epigastric "discomfort" Both retelling issues at home numerous times  Objective:  Vital signs:  Filed Vitals:   05/13/13 2205 05/13/13 2300 05/14/13 0222 05/14/13 0503  BP: 125/70 132/75 136/79 134/68  Pulse: 80 87 72 72  Temp: 98.3 F (36.8 C)  97.8 F (36.6 C) 97.6 F (36.4 C)  TempSrc: Oral  Oral Oral  Resp: _0 Height:      Weight:      SpO2: 99%  98% 99%    Last BM Date: 05/13/13  Intake/Output   Yesterday:  01/05 0701 - 01/06 0700 In: 2111.7 [P.O.:120; I.V.:1991.7] Out: -  This shift:     Bowel function:  Flatus: y  BM: y  Drain: n/a  Physical Exam:  General: Pt awake/alert/oriented x4 in no acute distress Eyes: PERRL, normal EOM.  Sclera clear.  No icterus Neuro: CN II-XII intact w/o focal sensory/motor deficits. Lymph: No head/neck/groin lymphadenopathy Psych:  No delerium/psychosis/paranoia HENT: Normocephalic, Mucus membranes moist.  No thrush Neck: Supple, No tracheal deviation Chest: No chest wall pain w good excursion CV:  Pulses intact.  Regular rhythm MS: Normal AROM mjr joints.  No obvious deformity Abdomen: Soft.  Nondistended.  Mildly tender at epigastric region.  No evidence of peritonitis.  No incarcerated hernias. Ext:  SCDs BLE.  No  mjr edema.  No cyanosis Skin: No petechiae / purpura   Problem List:   Active Problems:   HYPERTENSION   Incarcerated recurrent paraesophageal hiatal hernia s/p lap redo repair [id number]   Anxiety   Protein-calorie malnutrition, severe   Nausea   Assessment  Jamieson Lisa  78 y.o. female       Chronic nausea refractory to promethazine  ?Wrap edema?  Plan:  -standing zofran ("that works best") w reglan back up -IV steroids for nausea/?wrap edema -recheck findoplication w esoph swallow -try to reeducate diet - I went over this [place] and still their insight is poor, wondering if eggs or mashed veggies = liquid diet.  I went over the concept of liquids 1st, then pureed FOR MANY WEEKS until dysphagia down -HTN - restart home meds -hypoK - better -ANXIOLYSIS - big contributor to all of this -VTE prophylaxis- SCDs, etc -mobilize as tolerated to help recovery  I updated the patient's status to the patient & husband.  Recommendations were made.  Questions were answered.  The patient expressed understanding & appreciation.   Adin Hector, M.D., F.A.C.S. Gastrointestinal and Minimally Invasive Surgery [facility] [address] [place], [place] [address] [phone] Main / Paging   05/14/2013   Results:   Labs: Results for orders placed during the hospital encounter of 05/13/13 (from the past 48 hour(s))  COMPREHENSIVE METABOLIC PANEL     Status: Abnormal   Collection Time    05/12/13 11:35 PM      Result Value Range  Sodium 143  137 - 147 mEq/L   Comment: Please note change in reference range.   Potassium 3.3 (*) 3.7 - 5.3 mEq/L   Comment: Please note change in reference range.   Chloride 106  96 - 112 mEq/L   CO2 18 (*) 19 - 32 mEq/L   Glucose, Bld 93  70 - 99 mg/dL   BUN 9  6 - 23 mg/dL   Creatinine, Ser 0.71  0.50 - 1.10 mg/dL   Calcium 9.6  8.4 - 10.5 mg/dL   Total Protein 7.0  6.0 - 8.3 g/dL   Albumin 3.9  3.5 - 5.2  g/dL   AST 16  0 - 37 U/L   ALT 17  0 - 35 U/L   Alkaline Phosphatase 69  39 - 117 U/L   Total Bilirubin 0.8  0.3 - 1.2 mg/dL   GFR calc non Af Amer 82 (*) >90 mL/min   GFR calc Af Amer >90  >90 mL/min   Comment: (NOTE)     The eGFR has been calculated using the CKD EPI equation.     This calculation has not been validated in all clinical situations.     eGFR's persistently <90 mL/min signify possible Chronic Kidney     Disease.  LIPASE, BLOOD     Status: None   Collection Time    05/12/13 11:35 PM      Result Value Range   Lipase 20  11 - 59 U/L  POCT I-STAT TROPONIN I     Status: None   Collection Time    05/13/13 12:01 AM      Result Value Range   Troponin i, poc 0.01  0.00 - 0.08 ng/mL   Comment 3            Comment: Due to the release kinetics of cTnI,     a negative result within the first hours     of the onset of symptoms does not rule out     myocardial infarction with certainty.     If myocardial infarction is still suspected,     repeat the test at appropriate intervals.  CBC WITH DIFFERENTIAL     Status: Abnormal   Collection Time    05/13/13  1:45 AM      Result Value Range   WBC 5.3  4.0 - 10.5 K/uL   RBC 4.65  3.87 - 5.11 MIL/uL   Hemoglobin 14.2  12.0 - 15.0 g/dL   HCT 40.2  36.0 - 46.0 %   MCV 86.5  78.0 - 100.0 fL   MCH 30.5  26.0 - 34.0 pg   MCHC 35.3  30.0 - 36.0 g/dL   RDW 16.8 (*) 11.5 - 15.5 %   Platelets 383  150 - 400 K/uL   Neutrophils Relative % 40 (*) 43 - 77 %   Neutro Abs 2.1  1.7 - 7.7 K/uL   Lymphocytes Relative 42  12 - 46 %   Lymphs Abs 2.2  0.7 - 4.0 K/uL   Monocytes Relative 13 (*) 3 - 12 %   Monocytes Absolute 0.7  0.1 - 1.0 K/uL   Eosinophils Relative 4  0 - 5 %   Eosinophils Absolute 0.2  0.0 - 0.7 K/uL   Basophils Relative 1  0 - 1 %   Basophils Absolute 0.0  0.0 - 0.1 K/uL  BASIC METABOLIC PANEL     Status: Abnormal   Collection Time    05/14/13  5:43  AM      Result Value Range   Sodium 141  137 - 147 mEq/L   Potassium  3.9  3.7 - 5.3 mEq/L   Chloride 104  96 - 112 mEq/L   CO2 22  19 - 32 mEq/L   Glucose, Bld 158 (*) 70 - 99 mg/dL   BUN 4 (*) 6 - 23 mg/dL   Creatinine, Ser 0.61  0.50 - 1.10 mg/dL   Calcium 9.5  8.4 - 10.5 mg/dL   GFR calc non Af Amer 87 (*) >90 mL/min   GFR calc Af Amer >90  >90 mL/min   Comment: (NOTE)     The eGFR has been calculated using the CKD EPI equation.     This calculation has not been validated in all clinical situations.     eGFR's persistently <90 mL/min signify possible Chronic Kidney     Disease.  CBC     Status: Abnormal   Collection Time    05/14/13  5:43 AM      Result Value Range   WBC 2.7 (*) 4.0 - 10.5 K/uL   RBC 4.37  3.87 - 5.11 MIL/uL   Hemoglobin 13.0  12.0 - 15.0 g/dL   HCT 38.4  36.0 - 46.0 %   MCV 87.9  78.0 - 100.0 fL   MCH 29.7  26.0 - 34.0 pg   MCHC 33.9  30.0 - 36.0 g/dL   RDW 17.2 (*) 11.5 - 15.5 %   Platelets 355  150 - 400 K/uL    Imaging / Studies: Ct Chest W Contrast  05/13/2013   CLINICAL DATA:  Nausea and abdominal pain. Hiatal hernia repair 3 weeks prior. Rule out abscess.  EXAM: CT CHEST, ABDOMEN, AND PELVIS WITH CONTRAST  TECHNIQUE: Multidetector CT imaging of the chest, abdomen and pelvis was performed following the standard protocol during bolus administration of intravenous contrast.  CONTRAST:  151mL OMNIPAQUE IOHEXOL 300 MG/ML  SOLN  COMPARISON:  Chest CT 03/12/2013.  FINDINGS: CT CHEST FINDINGS  THORACIC INLET/BODY WALL:  No acute abnormality.  MEDIASTINUM:  Normal heart size. No pericardial effusion. Diffuse atherosclerosis, including the coronary arteries. There is suggestion of subendocardial fibro fatty scarring along the LAD distribution, compatible with remote infarct. No evidence of acute vascular abnormality.  Interval repair of recurrent hiatal hernia. There is a tight Nissen wrap with extra luminal high-density material consistent with pledgets (rather than extra luminal contrast). The distal esophagus and gastric wrap is  thickened. There is fluid accumulating around the posterior and right lateral distal esophagus measuring approximately 2 x 1 x 1.5 cm. No well-defined enhancing wall or surrounding fat infiltration.  LUNG WINDOWS:  No consolidation.  No effusion.  No suspicious pulmonary nodule.  OSSEOUS:  No acute fracture.  No suspicious lytic or blastic lesions.  CT ABDOMEN AND PELVIS FINDINGS  BODY WALL: Pelvic floor laxity with rectal prolapse.  Liver: Atrophy of the lateral segment left lobe liver, with intrahepatic biliary ductal dilatation.  Biliary: Cholecystectomy.  Chronic, diffuse biliary ductal dilation.  Pancreas: Unremarkable.  Spleen: No focal abnormality.  Adrenals: Unremarkable.  Kidneys and ureters: No hydronephrosis or stone. Tiny presumed cyst in the interpolar left kidney.  Bladder: Unremarkable.  Reproductive: Hysterectomy.  Bowel: No obstruction.  Retroperitoneum: No mass or adenopathy.  Peritoneum: No free fluid or gas.  Vascular: No acute abnormality.  OSSEOUS: Unchanged linear sclerosis in the left more than right sacral ala, consistent with previous insufficiency fractures. Remote L1 and L3 compression fractures with unchanged  height loss greater than 50%. Remote 12 superior endplate fracture. L4-5 anterolisthesis related to advanced facet osteoarthritis. No acute fracture seen  IMPRESSION: 1. Distal esophageal thickening/inflammation status post recent hiatal hernia repair with Nissen fundoplication. There is a 2 x 1 x 1.5 cm fluid collection along the lower right esophagus which is likely incidental/postoperative, but is sterility indeterminate by imaging. No evidence of esophageal leak. 2. Chronic intra-abdominal and intrathoracic findings are stable from recent CT comparisons, described above.   Electronically Signed   By: Jorje Guild M.D.   On: 05/13/2013 07:13   Ct Abdomen Pelvis W Contrast  05/13/2013   CLINICAL DATA:  Nausea and abdominal pain. Hiatal hernia repair 3 weeks prior. Rule out  abscess.  EXAM: CT CHEST, ABDOMEN, AND PELVIS WITH CONTRAST  TECHNIQUE: Multidetector CT imaging of the chest, abdomen and pelvis was performed following the standard protocol during bolus administration of intravenous contrast.  CONTRAST:  153m OMNIPAQUE IOHEXOL 300 MG/ML  SOLN  COMPARISON:  Chest CT 03/12/2013.  FINDINGS: CT CHEST FINDINGS  THORACIC INLET/BODY WALL:  No acute abnormality.  MEDIASTINUM:  Normal heart size. No pericardial effusion. Diffuse atherosclerosis, including the coronary arteries. There is suggestion of subendocardial fibro fatty scarring along the LAD distribution, compatible with remote infarct. No evidence of acute vascular abnormality.  Interval repair of recurrent hiatal hernia. There is a tight Nissen wrap with extra luminal high-density material consistent with pledgets (rather than extra luminal contrast). The distal esophagus and gastric wrap is thickened. There is fluid accumulating around the posterior and right lateral distal esophagus measuring approximately 2 x 1 x 1.5 cm. No well-defined enhancing wall or surrounding fat infiltration.  LUNG WINDOWS:  No consolidation.  No effusion.  No suspicious pulmonary nodule.  OSSEOUS:  No acute fracture.  No suspicious lytic or blastic lesions.  CT ABDOMEN AND PELVIS FINDINGS  BODY WALL: Pelvic floor laxity with rectal prolapse.  Liver: Atrophy of the lateral segment left lobe liver, with intrahepatic biliary ductal dilatation.  Biliary: Cholecystectomy.  Chronic, diffuse biliary ductal dilation.  Pancreas: Unremarkable.  Spleen: No focal abnormality.  Adrenals: Unremarkable.  Kidneys and ureters: No hydronephrosis or stone. Tiny presumed cyst in the interpolar left kidney.  Bladder: Unremarkable.  Reproductive: Hysterectomy.  Bowel: No obstruction.  Retroperitoneum: No mass or adenopathy.  Peritoneum: No free fluid or gas.  Vascular: No acute abnormality.  OSSEOUS: Unchanged linear sclerosis in the left more than right sacral ala,  consistent with previous insufficiency fractures. Remote L1 and L3 compression fractures with unchanged height loss greater than 50%. Remote 12 superior endplate fracture. L4-5 anterolisthesis related to advanced facet osteoarthritis. No acute fracture seen  IMPRESSION: 1. Distal esophageal thickening/inflammation status post recent hiatal hernia repair with Nissen fundoplication. There is a 2 x 1 x 1.5 cm fluid collection along the lower right esophagus which is likely incidental/postoperative, but is sterility indeterminate by imaging. No evidence of esophageal leak. 2. Chronic intra-abdominal and intrathoracic findings are stable from recent CT comparisons, described above.   Electronically Signed   By: JJorje GuildM.D.   On: 05/13/2013 07:13   Dg Abd Acute W/chest  05/13/2013   CLINICAL DATA:  Right lower quadrant pain, recent hiatal hernia fundoplication.  EXAM: ACUTE ABDOMEN SERIES (ABDOMEN 2 VIEW & CHEST 1 VIEW)  COMPARISON:  Acute abdominal series April 22, 2013  FINDINGS: Cardiomediastinal silhouette is nonsuspicious, unchanged. Mild chronic interstitial changes with scarring in left lung base. No pleural effusions or focal consolidations. No pneumothorax.  Bowel gas  pattern is nondilated and nonobstructive. Surgical clips in the right abdomen likely reflect cholecystectomy. Surgical clips at the gastroesophageal junction. No intra-abdominal mass effect or pathologic calcifications. No free air. Phleboliths in the pelvis. Patient is osteopenic. Mild levoscoliosis of the lumbar spine is unchanged. Mild L1 compression fracture may be new, chronic L3 moderate to severe compression deformity.  IMPRESSION: No acute cardiopulmonary process.  Nonspecific bowel gas pattern.  Osteopenia, chronic L3 moderate compression fracture with new appearing L1 compression fracture, recommend correlation with point tenderness.   Electronically Signed   By: Elon Alas   On: 05/13/2013 02:03    Medications /  Allergies: per chart  Antibiotics: Anti-infectives   None       Note: This dictation was prepared with Dragon/digital dictation along with Smartphrase technology. Any transcriptional errors that result from this process are unintentional.

## 2013-05-14 NOTE — Progress Notes (Signed)
INITIAL NUTRITION ASSESSMENT  Pt meets criteria for severe MALNUTRITION in the context of chronic illness as evidenced by <75% estimated energy intake in the past month with 9.3% weight loss in the past month.  DOCUMENTATION CODES Per approved criteria  -Severe malnutrition in the context of chronic illness   INTERVENTION: - Discussed pureed diet with pt and husband. Reviewed importance of pureed consistently and encouraged intake of nutritional supplements to promote weight gain. RD contact information provided.  - Diet advancement per MD - Will continue to monitor   NUTRITION DIAGNOSIS: Inadequate oral intake related to clear liquid diet as evidenced by diet order.   Goal: Advance diet as tolerated to puree/dysphagia 1 thin diet  Monitor:  Weights, labs, diet advancement, nausea  Reason for Assessment: Consult   78 y.o. female  Admitting Dx: Nausea, abdominal pain, chest pain  ASSESSMENT: Pt with hx of CAD, HTN, pneumonia, hiatal hernia here with nausea and abdominal pain and chest pain. She had a revision of the hiatal hernia 3 weeks ago. She's been nauseated since then but the nausea got worse. Pt known to RD from last admission. Educated pt during previous admission on post-op gastric fundoplication diet and emphasized importance of pureed foods and nutritional supplements.   Met with pt and husband today who report following diet and supplementing with Boost, however pt eventually stopped drinking Boost because she said it tasted too thick. Pt's weight down 14 pounds in the past month. Pt reports nausea improved today. Pt tried [employer] earlier today and enjoyed it and tolerated it well.    Height: Ht Readings from Last 1 Encounters:  05/13/13 $RemoveB'5\' 5"'veDXbZnN$  (1.651 m)    Weight: Wt Readings from Last 1 Encounters:  05/13/13 137 lb (62.143 kg)    Ideal Body Weight: 125 lb   % Ideal Body Weight: 110%  Wt Readings from Last 10 Encounters:  05/13/13 137 lb (62.143 kg)   04/30/13 143 lb 6.4 oz (65.046 kg)  04/23/13 151 lb (68.493 kg)  04/23/13 151 lb (68.493 kg)  04/19/13 151 lb (68.493 kg)  04/15/13 151 lb (68.493 kg)  04/14/13 151 lb (68.493 kg)  04/09/13 151 lb 12.8 oz (68.856 kg)  04/09/13 152 lb (68.947 kg)  04/03/13 152 lb 6.4 oz (69.128 kg)    Usual Body Weight: 181 lb per pt and husband  % Usual Body Weight: 76%  BMI:  Body mass index is 22.8 kg/(m^2).  Estimated Nutritional Needs: Kcal: 0071-2197 Protein: 75-95g Fluid: 1.7-1.9L/day  Skin: Abdominal incision  Diet Order: Bariatric clears  EDUCATION NEEDS: -Education needs addressed - discussed gastric fundoplication diet with pt and husband and importance of adherence. Handouts provided with RD contact information.    Intake/Output Summary (Last 24 hours) at 05/14/13 1521 Last data filed at 05/14/13 1000  Gross per 24 hour  Intake 2523.75 ml  Output    300 ml  Net 2223.75 ml    Last BM: 1/5  Labs:   Recent Labs Lab 05/12/13 2335 05/14/13 0543  NA 143 141  K 3.3* 3.9  CL 106 104  CO2 18* 22  BUN 9 4*  CREATININE 0.71 0.61  CALCIUM 9.6 9.5  GLUCOSE 93 158*    CBG (last 3)   Recent Labs  05/14/13 0806  GLUCAP 134*    Scheduled Meds: . acetaminophen  650 mg Oral TID  . amLODipine  10 mg Oral q morning - 10a  . dexamethasone  4 mg Intravenous Q12H  . feeding supplement (RESOURCE BREEZE)  1 Container Oral BID BM  . heparin subcutaneous  5,000 Units Subcutaneous Q8H  . lip balm  1 application Topical BID  . ondansetron  4 mg Oral TID AC & HS  . pantoprazole (PROTONIX) IV  40 mg Intravenous Q12H  . tiotropium  18 mcg Inhalation Daily    Continuous Infusions: . dextrose 5 % and 0.45 % NaCl with KCl 20 mEq/L 50 mL/hr at 05/14/13 5170    Past Medical History  Diagnosis Date  . CAD (coronary artery disease)     LHC 2003 with 40% pLAD, 30% mLAD, 100% D1 (moderate vessel), 100%  D2 (small vessel), 95% mid small OM2.  Pt had PTCA to D1;  D2 and OM2 small  vessels and tx medically;  Last Myoview (2012): anterior infarct seen with scar, no ischemia. EF normal. Pt managed medically;  Lexiscan Myoview (12/14):  Low risk; ant scar with very mild peri-infarct ischemia; EF 55% with ant AK   . Recurrent aspiration bronchitis/pneumonia     PFTs 12/09 FVC 97%, FEV1 101%, ratio 77%, TLC 109%. No significant obstruction or restriction.   . Hypertension   . Hyperlipidemia   . Esophageal stricture   . Diverticulosis of colon   . Gastroesophageal reflux disease with hiatal hernia   . Iron deficiency anemia   . Gastritis   . GERD (gastroesophageal reflux disease)   . Hiatal hernia   . Asthma   . Blood transfusion   . Cough   . Wheezing   . Weakness   . Trouble swallowing   . Abdominal pain   . Hiatal hernia   . Shortness of breath   . Anxiety   . Complication of anesthesia   . PONV (postoperative nausea and vomiting)   . TIA (transient ischemic attack)     Past Surgical History  Procedure Laterality Date  . Back surgery    . Cholecystectomy    . Bladder repair    . Hemorrhoid surgery    . Cholecystectomy      Patient unsure of date.  [name] Appendectomy      patient unsure of date  . Total abdominal hysterectomy  1975    partial  . Coronary angioplasty  11/16/2001  . Eye surgery  11/2000    bilateral cataracts with lens implant  . Hiatal hernia repair  06/03/2011    Procedure: LAPAROSCOPIC REPAIR OF HIATAL HERNIA;  Surgeon: Adin Hector, MD;  Location: [facility];  Service: General;  Laterality: N/A;  . Laparoscopic nissen fundoplication  0/17/4944    Procedure: LAPAROSCOPIC NISSEN FUNDOPLICATION;  Surgeon: Adin Hector, MD;  Location: [facility];  Service: General;  Laterality: N/A;  . Lumbar laminectomy/decompression microdiscectomy Right 06/26/2012    Procedure: LUMBAR LAMINECTOMY/DECOMPRESSION MICRODISCECTOMY;  Surgeon: Jessy Oto, MD;  Location: [facility];  Service: Orthopedics;  Laterality: Right;  RIGHT L4-5 MICRODISCECTOMY  .  Esophagogastroduodenoscopy N/A 03/15/2013    Procedure: ESOPHAGOGASTRODUODENOSCOPY (EGD);  Surgeon: Jerene Bears, MD;  Location: [facility];  Service: Endoscopy;  Laterality: N/A;  . Esophageal manometry N/A 03/25/2013    Procedure: ESOPHAGEAL MANOMETRY (EM);  Surgeon: Inda Castle, MD;  Location: [facility];  Service: Endoscopy;  Laterality: N/A;  . Hiatal hernia repair N/A 04/24/2013    Procedure: LAPAROSCOPIC  REPAIR RECURRENT PARASOPHAGEAL HIATAL HERNIA WITH FUNDOPLICATION;  Surgeon: Adin Hector, MD;  Location: [facility];  Service: General;  Laterality: N/A;  . Insertion of mesh N/A 04/24/2013    Procedure: INSERTION OF MESH;  Surgeon:  Adin Hector, MD;  Location: [facility];  Service: General;  Laterality: N/A;  . Laparoscopic lysis of adhesions N/A 04/24/2013    Procedure: LAPAROSCOPIC LYSIS OF ADHESIONS;  Surgeon: Adin Hector, MD;  Location: [facility];  Service: General;  Laterality: N/A;    Mikey College MS, [place], [place] Pager [phone] After Hours Pager

## 2013-05-14 NOTE — Progress Notes (Signed)
UR completed 

## 2013-05-15 ENCOUNTER — Encounter (INDEPENDENT_AMBULATORY_CARE_PROVIDER_SITE_OTHER): Payer: Medicare Other | Admitting: Surgery

## 2013-05-15 MED ORDER — MOMETASONE FURO-FORMOTEROL FUM 200-5 MCG/ACT IN AERO
2.0000 | INHALATION_SPRAY | Freq: Two times a day (BID) | RESPIRATORY_TRACT | Status: DC
  Administered 2013-05-15 – 2013-05-17 (×5): 2 via RESPIRATORY_TRACT
  Filled 2013-05-15: qty 8.8

## 2013-05-15 MED ORDER — ALPRAZOLAM 0.5 MG PO TABS
0.5000 mg | ORAL_TABLET | Freq: Two times a day (BID) | ORAL | Status: DC
  Administered 2013-05-15 – 2013-05-17 (×5): 0.5 mg via ORAL
  Filled 2013-05-15 (×3): qty 1

## 2013-05-15 MED ORDER — METOPROLOL TARTRATE 12.5 MG HALF TABLET
12.5000 mg | ORAL_TABLET | Freq: Every day | ORAL | Status: DC
  Administered 2013-05-15 – 2013-05-16 (×2): 12.5 mg via ORAL
  Filled 2013-05-15 (×3): qty 1

## 2013-05-15 MED ORDER — PANTOPRAZOLE SODIUM 40 MG PO TBEC
40.0000 mg | DELAYED_RELEASE_TABLET | Freq: Every day | ORAL | Status: DC
  Administered 2013-05-15 – 2013-05-16 (×2): 40 mg via ORAL
  Filled 2013-05-15 (×3): qty 1

## 2013-05-15 MED ORDER — FENTANYL CITRATE 0.05 MG/ML IJ SOLN: 25.0000 ug | INTRAMUSCULAR | Status: DC | PRN

## 2013-05-15 MED ORDER — ADULT MULTIVITAMIN W/MINERALS CH
1.0000 | ORAL_TABLET | Freq: Every morning | ORAL | Status: DC
  Administered 2013-05-15 – 2013-05-17 (×3): 1 via ORAL
  Filled 2013-05-15 (×3): qty 1

## 2013-05-15 NOTE — Progress Notes (Signed)
[name] [name] [id number] February 21, 1936  CARE TEAM:  PCP: Alesia Richards, MD  Outpatient Care Team: Patient Care Team: [name], MD as PCP - General (Internal Medicine) Inda Castle, MD as Consulting Physician (Gastroenterology) Rigoberto Noel, MD as Consulting Physician (Pulmonary Disease) Larey Dresser, MD as Consulting Physician (Cardiology) Adin Hector, MD as Consulting Physician (General Surgery) Jessy Oto, MD as Consulting Physician (Orthopedic Surgery)  Inpatient Treatment Team: Treatment Team: Attending Provider: Adin Hector, MD; Registered Nurse: Ethelene Hal, RN; Technician: Rondel Jumbo, NT; Dietitian: Christie Beckers, RD; Registered Nurse: July Dizon Pricilla Holm, RN   Subjective:  Feels OK Nauseated this AM Very anxious.  Tearful.  Crying.  Consolable. Denies abdominal pain today.  Walking in the hallways "10 times"   Objective:  Vital signs:  Filed Vitals:   05/14/13 1028 05/14/13 1400 05/14/13 2100 05/15/13 0605  BP:  106/62 126/68 144/79  Pulse:  78 77 86  Temp:  97.7 F (36.5 C) 98.1 F (36.7 C) 98 F (36.7 C)  TempSrc:  Oral Oral Oral  Resp:  _0 Height:      Weight:      SpO2: 98% 98% 96% 97%    Last BM Date: 05/14/13  Intake/Output   Yesterday:  01/06 0701 - 01/07 0700 In: 730.8 [I.V.:730.8] Out: 2900 [Urine:2900] This shift:  Total I/O In: -  Out: 2250 [Urine:2250]  Bowel function:  Flatus: y  BM: y, loose  Drain: n/a  Physical Exam:  General: Pt awake/alert/oriented x4 in no acute distress Eyes: PERRL, normal EOM.  Sclera clear.  No icterus Neuro: CN II-XII intact w/o focal sensory/motor deficits. Lymph: No head/neck/groin lymphadenopathy Psych:  No delerium/psychosis/paranoia.  Anxious tearful.  Interrupts with questions.  Does not seem to be listening well.  Eventually consolable and seems to be listening to me after numerous re-explanations. HENT: Normocephalic, Mucus membranes moist.   No thrush Neck: Supple, No tracheal deviation Chest: No chest wall pain w good excursion CV:  Pulses intact.  Regular rhythm MS: Normal AROM mjr joints.  No obvious deformity Abdomen: Soft.  Nondistended.  Nontender at epigastric region.  No evidence of peritonitis.  No incarcerated hernias. Ext:  SCDs BLE.  No mjr edema.  No cyanosis Skin: No petechiae / purpura   Problem List:   Active Problems:   HYPERTENSION   Incarcerated recurrent paraesophageal hiatal hernia s/p lap redo repair [id number]   Anxiety   Protein-calorie malnutrition, severe   Nausea   Assessment  Colletta Spillers  78 y.o. female       Chronic nausea refractory to promethazine  ?Wrap edema?  Plan:  -standing zofran ("that works best") w reglan back up -IV steroids for nausea/?wrap edema -Do bariatric liquid diet yesterday.  I do not know if this happened.  We will try clear liquids and see.  reeducate diet  -HTN - restart home meds.  Increase more since still having hypertension. -hypoK - better -ANXIOLYSIS - big contributor to all of this -VTE prophylaxis- SCDs, etc -mobilize as tolerated to help recovery  I updated the patient's status to the patient & husband.  Recommendations were made.  Questions were answered.  The patient expressed understanding & appreciation.   Adin Hector, M.D., F.A.C.S. Gastrointestinal and Minimally Invasive Surgery [facility] [address] [place], [place] [address] [phone] Main / Paging   05/15/2013   Results:   Labs: Results for orders placed during the hospital  encounter of 05/13/13 (from the past 48 hour(s))  BASIC METABOLIC PANEL     Status: Abnormal   Collection Time    05/14/13  5:43 AM      Result Value Range   Sodium 141  137 - 147 mEq/L   Potassium 3.9  3.7 - 5.3 mEq/L   Chloride 104  96 - 112 mEq/L   CO2 22  19 - 32 mEq/L   Glucose, Bld 158 (*) 70 - 99 mg/dL   BUN 4 (*) 6 - 23 mg/dL   Creatinine, Ser  0.61  0.50 - 1.10 mg/dL   Calcium 9.5  8.4 - 10.5 mg/dL   GFR calc non Af Amer 87 (*) >90 mL/min   GFR calc Af Amer >90  >90 mL/min   Comment: (NOTE)     The eGFR has been calculated using the CKD EPI equation.     This calculation has not been validated in all clinical situations.     eGFR's persistently <90 mL/min signify possible Chronic Kidney     Disease.  CBC     Status: Abnormal   Collection Time    05/14/13  5:43 AM      Result Value Range   WBC 2.7 (*) 4.0 - 10.5 K/uL   RBC 4.37  3.87 - 5.11 MIL/uL   Hemoglobin 13.0  12.0 - 15.0 g/dL   HCT 38.4  36.0 - 46.0 %   MCV 87.9  78.0 - 100.0 fL   MCH 29.7  26.0 - 34.0 pg   MCHC 33.9  30.0 - 36.0 g/dL   RDW 17.2 (*) 11.5 - 15.5 %   Platelets 355  150 - 400 K/uL  GLUCOSE, CAPILLARY     Status: Abnormal   Collection Time    05/14/13  8:06 AM      Result Value Range   Glucose-Capillary 134 (*) 70 - 99 mg/dL    Imaging / Studies: Ct Chest W Contrast  05/13/2013   CLINICAL DATA:  Nausea and abdominal pain. Hiatal hernia repair 3 weeks prior. Rule out abscess.  EXAM: CT CHEST, ABDOMEN, AND PELVIS WITH CONTRAST  TECHNIQUE: Multidetector CT imaging of the chest, abdomen and pelvis was performed following the standard protocol during bolus administration of intravenous contrast.  CONTRAST:  148m OMNIPAQUE IOHEXOL 300 MG/ML  SOLN  COMPARISON:  Chest CT 03/12/2013.  FINDINGS: CT CHEST FINDINGS  THORACIC INLET/BODY WALL:  No acute abnormality.  MEDIASTINUM:  Normal heart size. No pericardial effusion. Diffuse atherosclerosis, including the coronary arteries. There is suggestion of subendocardial fibro fatty scarring along the LAD distribution, compatible with remote infarct. No evidence of acute vascular abnormality.  Interval repair of recurrent hiatal hernia. There is a tight Nissen wrap with extra luminal high-density material consistent with pledgets (rather than extra luminal contrast). The distal esophagus and gastric wrap is thickened.  There is fluid accumulating around the posterior and right lateral distal esophagus measuring approximately 2 x 1 x 1.5 cm. No well-defined enhancing wall or surrounding fat infiltration.  LUNG WINDOWS:  No consolidation.  No effusion.  No suspicious pulmonary nodule.  OSSEOUS:  No acute fracture.  No suspicious lytic or blastic lesions.  CT ABDOMEN AND PELVIS FINDINGS  BODY WALL: Pelvic floor laxity with rectal prolapse.  Liver: Atrophy of the lateral segment left lobe liver, with intrahepatic biliary ductal dilatation.  Biliary: Cholecystectomy.  Chronic, diffuse biliary ductal dilation.  Pancreas: Unremarkable.  Spleen: No focal abnormality.  Adrenals: Unremarkable.  Kidneys and ureters: No  hydronephrosis or stone. Tiny presumed cyst in the interpolar left kidney.  Bladder: Unremarkable.  Reproductive: Hysterectomy.  Bowel: No obstruction.  Retroperitoneum: No mass or adenopathy.  Peritoneum: No free fluid or gas.  Vascular: No acute abnormality.  OSSEOUS: Unchanged linear sclerosis in the left more than right sacral ala, consistent with previous insufficiency fractures. Remote L1 and L3 compression fractures with unchanged height loss greater than 50%. Remote 12 superior endplate fracture. L4-5 anterolisthesis related to advanced facet osteoarthritis. No acute fracture seen  IMPRESSION: 1. Distal esophageal thickening/inflammation status post recent hiatal hernia repair with Nissen fundoplication. There is a 2 x 1 x 1.5 cm fluid collection along the lower right esophagus which is likely incidental/postoperative, but is sterility indeterminate by imaging. No evidence of esophageal leak. 2. Chronic intra-abdominal and intrathoracic findings are stable from recent CT comparisons, described above.   Electronically Signed   By: Jorje Guild M.D.   On: 05/13/2013 07:13   Ct Abdomen Pelvis W Contrast  05/13/2013   CLINICAL DATA:  Nausea and abdominal pain. Hiatal hernia repair 3 weeks prior. Rule out abscess.   EXAM: CT CHEST, ABDOMEN, AND PELVIS WITH CONTRAST  TECHNIQUE: Multidetector CT imaging of the chest, abdomen and pelvis was performed following the standard protocol during bolus administration of intravenous contrast.  CONTRAST:  18m OMNIPAQUE IOHEXOL 300 MG/ML  SOLN  COMPARISON:  Chest CT 03/12/2013.  FINDINGS: CT CHEST FINDINGS  THORACIC INLET/BODY WALL:  No acute abnormality.  MEDIASTINUM:  Normal heart size. No pericardial effusion. Diffuse atherosclerosis, including the coronary arteries. There is suggestion of subendocardial fibro fatty scarring along the LAD distribution, compatible with remote infarct. No evidence of acute vascular abnormality.  Interval repair of recurrent hiatal hernia. There is a tight Nissen wrap with extra luminal high-density material consistent with pledgets (rather than extra luminal contrast). The distal esophagus and gastric wrap is thickened. There is fluid accumulating around the posterior and right lateral distal esophagus measuring approximately 2 x 1 x 1.5 cm. No well-defined enhancing wall or surrounding fat infiltration.  LUNG WINDOWS:  No consolidation.  No effusion.  No suspicious pulmonary nodule.  OSSEOUS:  No acute fracture.  No suspicious lytic or blastic lesions.  CT ABDOMEN AND PELVIS FINDINGS  BODY WALL: Pelvic floor laxity with rectal prolapse.  Liver: Atrophy of the lateral segment left lobe liver, with intrahepatic biliary ductal dilatation.  Biliary: Cholecystectomy.  Chronic, diffuse biliary ductal dilation.  Pancreas: Unremarkable.  Spleen: No focal abnormality.  Adrenals: Unremarkable.  Kidneys and ureters: No hydronephrosis or stone. Tiny presumed cyst in the interpolar left kidney.  Bladder: Unremarkable.  Reproductive: Hysterectomy.  Bowel: No obstruction.  Retroperitoneum: No mass or adenopathy.  Peritoneum: No free fluid or gas.  Vascular: No acute abnormality.  OSSEOUS: Unchanged linear sclerosis in the left more than right sacral ala, consistent  with previous insufficiency fractures. Remote L1 and L3 compression fractures with unchanged height loss greater than 50%. Remote 12 superior endplate fracture. L4-5 anterolisthesis related to advanced facet osteoarthritis. No acute fracture seen  IMPRESSION: 1. Distal esophageal thickening/inflammation status post recent hiatal hernia repair with Nissen fundoplication. There is a 2 x 1 x 1.5 cm fluid collection along the lower right esophagus which is likely incidental/postoperative, but is sterility indeterminate by imaging. No evidence of esophageal leak. 2. Chronic intra-abdominal and intrathoracic findings are stable from recent CT comparisons, described above.   Electronically Signed   By: JJorje GuildM.D.   On: 05/13/2013 07:13   Dg Esophagus W/water  Sol Cm  05/14/2013   CLINICAL DATA:  Post redo fundoplication.  EXAM: ESOPHOGRAM/BARIUM SWALLOW  TECHNIQUE: Single contrast examination was performed using  water soluble.  COMPARISON:  05/13/2013 CT.  FLUOROSCOPY TIME:  2 min and 5 seconds  FINDINGS: Radiopaque material at the gastroesophageal junction consistent with material utilized for procedure in addition to surgical clips.  Circumferential narrowing at the reconstructed gastroesophageal junction without leak identified.  There is slow transit time through this region which may be partially explained by postoperative edema in addition to the operative procedure.  With the patient in an upright position, ingested contrast remains within the distal esophagus above this region of circumferential narrowing but subsequently clears with dry swallowing.  Remote compression fracture L1 and L3 vertebral body incompletely assessed.  IMPRESSION: Radiopaque material at the gastroesophageal junction consistent with material utilized for procedure in addition to surgical clips.  Circumferential narrowing at the reconstructed gastroesophageal junction without leak identified.  There is slow transit time through  this region which may be partially explained by postoperative edema in addition to the operative procedure.  With the patient in an upright position, ingested contrast remains within the distal esophagus above this region of circumferential narrowing but subsequently clears with dry swallowing.  This is a call report.   Electronically Signed   By: Chauncey Cruel M.D.   On: 05/14/2013 10:36    Medications / Allergies: per chart  Antibiotics: Anti-infectives   None       Note: This dictation was prepared with Dragon/digital dictation along with [employer]. Any transcriptional errors that result from this process are unintentional.

## 2013-05-16 ENCOUNTER — Ambulatory Visit: Payer: Self-pay | Admitting: Physician Assistant

## 2013-05-16 MED ORDER — CALCITONIN (SALMON) 200 UNIT/ACT NA SOLN
1.0000 | Freq: Every day | NASAL | Status: DC
  Administered 2013-05-16 – 2013-05-17 (×2): 1 via NASAL
  Filled 2013-05-16: qty 3.7

## 2013-05-16 MED ORDER — ONDANSETRON HCL 4 MG PO TABS: 4.0000 mg | ORAL_TABLET | Freq: Three times a day (TID) | ORAL | Status: DC | PRN

## 2013-05-16 MED ORDER — SALINE SPRAY 0.65 % NA SOLN
1.0000 | NASAL | Status: DC | PRN
  Administered 2013-05-16: 1 via NASAL
  Filled 2013-05-16: qty 44

## 2013-05-16 MED ORDER — ACETAMINOPHEN 500 MG PO TABS
1000.0000 mg | ORAL_TABLET | Freq: Three times a day (TID) | ORAL | Status: DC
  Administered 2013-05-16 – 2013-05-17 (×4): 1000 mg via ORAL
  Filled 2013-05-16 (×6): qty 2

## 2013-05-16 MED ORDER — METOCLOPRAMIDE HCL 10 MG PO TABS: 10.0000 mg | ORAL_TABLET | Freq: Four times a day (QID) | ORAL | Status: DC | PRN

## 2013-05-16 MED ORDER — ONDANSETRON HCL 8 MG PO TABS
8.0000 mg | ORAL_TABLET | Freq: Three times a day (TID) | ORAL | Status: DC
  Administered 2013-05-16 – 2013-05-17 (×5): 8 mg via ORAL
  Filled 2013-05-16 (×2): qty 1
  Filled 2013-05-16: qty 2
  Filled 2013-05-16 (×2): qty 1
  Filled 2013-05-16: qty 2
  Filled 2013-05-16: qty 1
  Filled 2013-05-16: qty 2
  Filled 2013-05-16 (×3): qty 1
  Filled 2013-05-16: qty 2
  Filled 2013-05-16: qty 1
  Filled 2013-05-16: qty 2

## 2013-05-16 NOTE — Plan of Care (Signed)
Problem: Phase II Progression Outcomes Goal: IV changed to normal saline lock Outcome: Progressing IV is saline locked.   Problem: Phase III Progression Outcomes Goal: Activity at appropriate level-compared to baseline (UP IN CHAIR FOR HEMODIALYSIS)  Outcome: Progressing Pt ambulate independently.

## 2013-05-16 NOTE — Progress Notes (Addendum)
[name] [name] [id number] 1935/06/04  CARE TEAM:  PCP: Alesia Richards, MD  Outpatient Care Team: Patient Care Team: [name], MD as PCP - General (Internal Medicine) Inda Castle, MD as Consulting Physician (Gastroenterology) Rigoberto Noel, MD as Consulting Physician (Pulmonary Disease) Larey Dresser, MD as Consulting Physician (Cardiology) Adin Hector, MD as Consulting Physician (General Surgery) Jessy Oto, MD as Consulting Physician (Orthopedic Surgery)  Inpatient Treatment Team: Treatment Team: Attending Provider: Adin Hector, MD; Registered Nurse: Ethelene Hal, RN; Technician: Rondel Jumbo, NT; Dietitian: Christie Beckers, RD; Registered Nurse: July Charlynn Grimes, RN; Registered Nurse: Eugenie Birks, RN   Subjective:  Feels better Nauseated less - standing Zofran helps Feels much better w standing BID Xanax Denies abdominal pain today. Walking in the hallways a lot Concerned about having a mildly productive cough. Concerned about blood on her Kleenex when she blew her nose this AM Concerned about nausea with narcotics Concerned about back soreness when she takes a deep breath.  Objective:  Vital signs:  Filed Vitals:   05/15/13 0753 05/15/13 1351 05/15/13 2150 05/16/13 0540  BP:  123/71 125/75 138/84  Pulse:  75 74 79  Temp:  98 F (36.7 C) 97.3 F (36.3 C) 97.6 F (36.4 C)  TempSrc:  Oral Oral Oral  Resp:  16 18 18   Height:      Weight:      SpO2: 99% 98% 97% 98%    Last BM Date: 05/14/13  Intake/Output   Yesterday:  01/07 0701 - 01/08 0700 In: -  Out: 2550 [Urine:2550] This shift:     Bowel function:  Flatus: y  BM: y, loose  Drain: n/a  Physical Exam:  General: Pt awake/alert/oriented x4 in no acute distress Eyes: PERRL, normal EOM.  Sclera clear.  No icterus Neuro: CN II-XII intact w/o focal sensory/motor deficits. Lymph: No head/neck/groin lymphadenopathy Psych:  No  delerium/psychosis/paranoia.  Calm.  Smiling HENT: Normocephalic, Mucus membranes dry.  Old blood on Kleenex tissue.  No active bleeding.  No stridor.  No conversational or audible dyspnea. Neck: Supple, No tracheal deviation Chest: No chest wall pain w good excursion CV:  Pulses intact.  Regular rhythm MS: Normal AROM mjr joints.  No obvious deformity Abdomen: Soft.  Nondistended.  Nontender at epigastric region.  No evidence of peritonitis.  No incarcerated hernias. Ext:  SCDs BLE.  No mjr edema.  No cyanosis Skin: No petechiae / purpura   Problem List:   Active Problems:   HYPERTENSION   Incarcerated recurrent paraesophageal hiatal hernia s/p lap redo repair [id number]   Anxiety   Protein-calorie malnutrition, severe   Nausea   Dysphagia   Assessment  [name] [name]  78 y.o. female       Chronic nausea refractory to promethazine  Wrap edema most likely resolving w steroids & slower PO  Plan:  -standing zofran ("that works best") w reglan back up -IV steroids for nausea/?wrap edema -Adv from clears to pureed.  Work on Psychologist, sport and exercise.  D/w RN whom notes that it is still a challenege for the pt to comprehend.  See if nutrition can come by again  -HTN - restart home meds.  Increase more since still having hypertension. -hypoK - better Make sure she is getting her nasal drops.  In saline for breakthrough.  Mild cough can be expected with this.  She is not hypoxic.  Would monitor and treat symptoms for now. -ANXIOLYSIS - big contributor to all of this.  Now better -VTE prophylaxis- SCDs, etc -mobilize as tolerated to help recovery  I updated the patient's status to the patient & husband.  Recommendations were made.  Questions were answered.  The patient expressed understanding & appreciation.   Adin Hector, M.D., F.A.C.S. Gastrointestinal and Minimally Invasive Surgery [facility] [address] [place], [place] [address] [phone] Main  / Paging   05/16/2013   Results:   Labs: Results for orders placed during the hospital encounter of 05/13/13 (from the past 48 hour(s))  GLUCOSE, CAPILLARY     Status: Abnormal   Collection Time    05/14/13  8:06 AM      Result Value Range   Glucose-Capillary 134 (*) 70 - 99 mg/dL    Imaging / Studies: Dg Esophagus W/water Sol Cm  05/14/2013   CLINICAL DATA:  Post redo fundoplication.  EXAM: ESOPHOGRAM/BARIUM SWALLOW  TECHNIQUE: Single contrast examination was performed using  water soluble.  COMPARISON:  05/13/2013 CT.  FLUOROSCOPY TIME:  2 min and 5 seconds  FINDINGS: Radiopaque material at the gastroesophageal junction consistent with material utilized for procedure in addition to surgical clips.  Circumferential narrowing at the reconstructed gastroesophageal junction without leak identified.  There is slow transit time through this region which may be partially explained by postoperative edema in addition to the operative procedure.  With the patient in an upright position, ingested contrast remains within the distal esophagus above this region of circumferential narrowing but subsequently clears with dry swallowing.  Remote compression fracture L1 and L3 vertebral body incompletely assessed.  IMPRESSION: Radiopaque material at the gastroesophageal junction consistent with material utilized for procedure in addition to surgical clips.  Circumferential narrowing at the reconstructed gastroesophageal junction without leak identified.  There is slow transit time through this region which may be partially explained by postoperative edema in addition to the operative procedure.  With the patient in an upright position, ingested contrast remains within the distal esophagus above this region of circumferential narrowing but subsequently clears with dry swallowing.  This is a call report.   Electronically Signed   By: Chauncey Cruel M.D.   On: 05/14/2013 10:36    Medications / Allergies: per  chart  Antibiotics: Anti-infectives   None       Note: This dictation was prepared with Dragon/digital dictation along with [employer]. Any transcriptional errors that result from this process are unintentional.

## 2013-05-17 ENCOUNTER — Telehealth (INDEPENDENT_AMBULATORY_CARE_PROVIDER_SITE_OTHER): Payer: Self-pay

## 2013-05-17 ENCOUNTER — Encounter (HOSPITAL_COMMUNITY): Payer: Self-pay | Admitting: Surgery

## 2013-05-17 DIAGNOSIS — R110 Nausea: Secondary | ICD-10-CM

## 2013-05-17 MED ORDER — ONDANSETRON 4 MG PO TBDP: 4.0000 mg | ORAL_TABLET | Freq: Three times a day (TID) | ORAL | Status: DC | PRN

## 2013-05-17 MED ORDER — TRAMADOL HCL 50 MG PO TABS: 50.0000 mg | ORAL_TABLET | Freq: Four times a day (QID) | ORAL | Status: DC | PRN

## 2013-05-17 MED ORDER — METOCLOPRAMIDE HCL 5 MG PO TABS: 5.0000 mg | ORAL_TABLET | Freq: Four times a day (QID) | ORAL | Status: DC

## 2013-05-17 NOTE — Progress Notes (Signed)
Spoke with MD Gross concerning Carafate to be resume during discharge and he stated that

## 2013-05-17 NOTE — Telephone Encounter (Signed)
Called pt back to let her know that I e-prescribed Zofran 4mg  #8 x 5rf's and lmom at [facility] for the Reglan 67m #30 x1 Rf's per Dr Johney Maine. The pt understands.

## 2013-05-17 NOTE — Progress Notes (Signed)
Pt for d/c to home today. IV d/c'd. Tolerates diet for lunch without N/V. Husband at bedside to assist with d/c. Discharge instructions & Rx given with verbalized understanding. Pt assisted by NT to lobby via wheelchair.

## 2013-05-17 NOTE — Discharge Instructions (Signed)
EATING AFTER YOUR ESOPHAGEAL SURGERY (Stomach Fundoplication, Hiatal Hernia repair, Achalasia surgery, etc)  After your esophageal surgery, expect some sticking with swallowing over the next 1-2 months.    If food sticks when you eat, it is called "dysphagia".  This is due to swelling around your esophagus at the wrap & hiatal diaphragm repair.  It will gradually ease off over the next few months.  To help you through this temporary phase, we start you out on a pureed (blenderized) diet.  Your first meal in the hospital was thin liquids.  You should have been given a pureed diet by the time you left the hospital.  We ask patients to stay on a pureed diet for the first 2-3 weeks to avoid anything getting "stuck" near your recent surgery.  Don't be alarmed if your ability to swallow doesn't progress according to this plan.  Everyone is different and some diets can advance more or less quickly.     Some BASIC RULES to follow are:  Maintain an upright position whenever eating or drinking.  Take small bites - just a teaspoon size bite at a time.  Eat slowly.  It may also help to eat only one food at a time.  Consider nibbling through smaller, more frequent meals & avoid the urge to eat BIG meals  Do not push through feelings of fullness, nausea, or bloatedness  Do not mix solid foods and liquids in the same mouthful  Try not to "wash foods down" with large gulps of liquids. Avoid carbonated (bubbly/fizzy) drinks.  Understand that it will be hard to burp and belch at first.  This gradually improves with time.  Expect to be more gassy/flatulent/bloated initially.  Walking will help you work through that.  Maalox/Gas-X can help as well.  Eat in a relaxed atmosphere & minimize distractions.  Avoid talking while eating.    Do not use straws.  Following each meal, sit in an upright position (90 degree angle) for 60 to 90 minutes.  Going for a short walk can help as well  If food does stick,  don't panic.  Try to relax and let the food pass on its own.  Sipping WARM LIQUID such as strong hot black tea can also help slide it down.   Be gradual in changes & use common sense:  -If you easily tolerating a certain "level" of foods, advance to the next level gradually -If you are having trouble swallowing a particular food, then avoid it.   -If food is sticking when you advance your diet, go back to thinner previous diet (the lower LEVEL) for 1-2 days.  LEVEL 1 = PUREED DIET  Do for the first 2 WEEKS AFTER SURGERY  -Foods in this group are pureed or blenderized to a smooth, mashed potato-like consistency.  -If necessary, the pureed foods can keep their shape with the addition of a thickening agent.   -Meat should be pureed to a smooth, pasty consistency.  Hot broth or gravy may be added to the pureed meat, approximately 1 oz. of liquid per 3 oz. serving of meat. -CAUTION:  If any foods do not puree into a smooth consistency, swallowing will be more difficult.  (For example, nuts or seeds sometimes do not blend well.)  Hot Foods Cold Foods  Pureed scrambled eggs and cheese Pureed cottage cheese  Baby cereals Thickened juices and nectars  Thinned cooked cereals (no lumps) Thickened milk or eggnog  Pureed [place] toast or pancakes Ensure  Mashed  potatoes Ice cream  Pureed parsley, au gratin, scalloped potatoes, candied sweet potatoes Fruit or [place] ice, sherbet  Pureed buttered or alfredo noodles Plain yogurt  Pureed vegetables (no corn or peas) Instant breakfast  Pureed soups and creamed soups Smooth pudding, mousse, custard  Pureed scalloped apples Whipped gelatin  Gravies Sugar, syrup, honey, jelly  Sauces, cheese, tomato, barbecue, white, creamed Cream  Any baby food Creamer  Alcohol in moderation (not beer or champagne) Margarine  Coffee or tea Mayonnaise   Ketchup, mustard   Apple sauce   SAMPLE MENU:  PUREED DIET Breakfast Lunch Dinner   Orange juice, 1/2  cup  Cream of wheat, 1/2 cup  Pineapple juice, 1/2 cup  Pureed [place], barley soup, 3/4 cup  Pureed Hawaiian chicken, 3 oz   Scrambled eggs, mashed or blended with cheese, 1/2 cup  Tea or coffee, 1 cup   Whole milk, 1 cup   Non-dairy creamer, 2 Tbsp.  Mashed potatoes, 1/2 cup  Pureed cooled broccoli, 1/2 cup  Apple sauce, 1/2 cup  Coffee or tea  Mashed potatoes, 1/2 cup  Pureed spinach, 1/2 cup  Frozen yogurt, 1/2 cup  Tea or coffee      LEVEL 2 = SOFT DIET  After your first 2 weeks, you can advance to a soft diet.   Keep on this diet until everything goes down easily.  Hot Foods Cold Foods  White fish Cottage cheese  Stuffed fish Junior baby fruit  Baby food meals Semi thickened juices  Minced soft cooked, scrambled, poached eggs nectars  Souffle & omelets Ripe mashed bananas  Cooked cereals Canned fruit, pineapple sauce, milk  potatoes Milkshake  Buttered or Alfredo noodles Custard  Cooked cooled vegetable Puddings, including tapioca  Sherbet Yogurt  Vegetable soup or alphabet soup Fruit ice, [place] ice  Gravies Whipped gelatin  Sugar, syrup, honey, jelly Junior baby desserts  Sauces:  Cheese, creamed, barbecue, tomato, white Cream  Coffee or tea Margarine   SAMPLE MENU:  LEVEL 2 Breakfast Lunch Dinner   Orange juice, 1/2 cup  Oatmeal, 1/2 cup  Scrambled eggs with cheese, 1/2 cup  Decaffeinated tea, 1 cup  Whole milk, 1 cup  Non-dairy creamer, 2 Tbsp  Pineapple juice, 1/2 cup  Minced beef, 3 oz  Gravy, 2 Tbsp  Mashed potatoes, 1/2 cup  Minced fresh broccoli, 1/2 cup  Applesauce, 1/2 cup  Coffee, 1 cup  [place], barley soup, 3/4 cup  Minced Hawaiian chicken, 3 oz  Mashed potatoes, 1/2 cup  Cooked spinach, 1/2 cup  Frozen yogurt, 1/2 cup  Non-dairy creamer, 2 Tbsp      LEVEL 3 = CHOPPED DIET  -After all the foods in level 2 (soft diet) are passing through well you should advance up to more chopped foods.  -It is still  important to cut these foods into small pieces and eat slowly.  Hot Foods Cold Foods  Poultry Cottage cheese  Chopped Swedish meatballs Yogurt  Meat salads (ground or flaked meat) Milk  Flaked fish (tuna) Milkshakes  Poached or scrambled eggs Soft, cold, dry cereal  Souffles and omelets Fruit juices or nectars  Cooked cereals Chopped canned fruit  Chopped [place] toast or pancakes Canned fruit cocktail  Noodles or pasta (no rice) Pudding, mousse, custard  Cooked vegetables (no frozen peas, corn, or mixed vegetables) Green salad  Canned small sweet peas Ice cream  Creamed soup or vegetable soup Fruit ice, [place] ice  Pureed vegetable soup or alphabet soup Non-dairy creamer  Ground scalloped  apples Margarine  Gravies Mayonnaise  Sauces:  Cheese, creamed, barbecue, tomato, white Ketchup  Coffee or tea Mustard   SAMPLE MENU:  LEVEL 3 Breakfast Lunch Dinner   Orange juice, 1/2 cup  Oatmeal, 1/2 cup  Scrambled eggs with cheese, 1/2 cup  Decaffeinated tea, 1 cup  Whole milk, 1 cup  Non-dairy creamer, 2 Tbsp  Ketchup, 1 Tbsp  Margarine, 1 tsp  Salt, 1/4 tsp  Sugar, 2 tsp  Pineapple juice, 1/2 cup  Ground beef, 3 oz  Gravy, 2 Tbsp  Mashed potatoes, 1/2 cup  Cooked spinach, 1/2 cup  Applesauce, 1/2 cup  Decaffeinated coffee  Whole milk  Non-dairy creamer, 2 Tbsp  Margarine, 1 tsp  Salt, 1/4 tsp  Pureed [place], barley soup, 3/4 cup  Barbecue chicken, 3 oz  Mashed potatoes, 1/2 cup  Ground fresh broccoli, 1/2 cup  Frozen yogurt, 1/2 cup  Decaffeinated tea, 1 cup  Non-dairy creamer, 2 Tbsp  Margarine, 1 tsp  Salt, 1/4 tsp  Sugar, 1 tsp    LEVEL 4:  REGULAR FOODS  -Foods in this group are soft, moist, regularly textured foods.   -This level includes meat and breads, which tend to be the hardest things to swallow.   -Eat very slowly, chew well and continue to avoid carbonated drinks. -most people are at this level in 4-6 weeks  Hot Foods  Cold Foods  Baked fish or skinned Soft cheeses - cottage cheese  Souffles and omelets Cream cheese  Eggs Yogurt  Stuffed shells Milk  Spaghetti with meat sauce Milkshakes  Cooked cereal Cold dry cereals (no nuts, dried fruit, coconut)  [place] toast or pancakes Crackers  Buttered toast Fruit juices or nectars  Noodles or pasta (no rice) Canned fruit  Potatoes (all types) Ripe bananas  Soft, cooked vegetables (no corn, lima, or baked beans) Peeled, ripe, fresh fruit  Creamed soups or vegetable soup Cakes (no nuts, dried fruit, coconut)  Canned chicken noodle soup Plain doughnuts  Gravies Ice cream  Bacon dressing Pudding, mousse, custard  Sauces:  Cheese, creamed, barbecue, tomato, white Fruit ice, [place] ice, sherbet  Decaffeinated tea or coffee Whipped gelatin  Pork chops Regular gelatin   Canned fruited gelatin molds   Sugar, syrup, honey, jam, jelly   Cream   Non-dairy   Margarine   Oil   Mayonnaise   Ketchup   Mustard    If you have any questions please call our office at [place]: [phone].  Dysphagia Swallowing problems (dysphagia) occur when solids and liquids seem to stick in your throat on the way down to your stomach, or the food takes longer to get to the stomach. Other symptoms include regurgitating food, noises coming from the throat, chest discomfort with swallowing, and a feeling of fullness or the feeling of something being stuck in your throat when swallowing. When blockage in your throat is complete it may be associated with drooling. CAUSES  Problems with swallowing may occur because of problems with the muscles. The food cannot be propelled in the usual manner into your stomach. You may have ulcers, scar tissue, or inflammation in the tube down which food travels from your mouth to your stomach (esophagus), which blocks food from passing normally into the stomach. Causes of inflammation include:  Acid reflux from your stomach into your  esophagus.  Infection.  Radiation treatment for cancer.  Medicines taken without enough fluids to wash them down into your stomach. You may have nerve problems that prevent signals from  being sent to the muscles of your esophagus to contract and move your food down to your stomach. Globus pharyngeus is a relatively common problem in which there is a sense of an obstruction or difficulty in swallowing, without any physical abnormalities of the swallowing passages being found. This problem usually improves over time with reassurance and testing to rule out other causes. DIAGNOSIS Dysphagia can be diagnosed and its cause can be determined by tests in which you swallow a white substance that helps illuminate the inside of your throat (contrast medium) while X-rays are taken. Sometimes a flexible telescope that is inserted down your throat (endoscopy) to look at your esophagus and stomach is used. TREATMENT   If the dysphagia is caused by acid reflux or infection, medicines may be used.  If the dysphagia is caused by problems with your swallowing muscles, swallowing therapy may be used to help you strengthen your swallowing muscles.  If the dysphagia is caused by a blockage or mass, procedures to remove the blockage may be done. HOME CARE INSTRUCTIONS  Try to eat soft food that is easier to swallow and check your weight on a daily basis to be sure that it is not decreasing.  Be sure to drink liquids when sitting upright (not lying down). SEEK MEDICAL CARE IF:  You are losing weight because you are unable to swallow.  You are coughing when you drink liquids (aspiration).  You are coughing up partially digested food. SEEK IMMEDIATE MEDICAL CARE IF:  You are unable to swallow your own saliva .  You are having shortness of breath or a fever, or both.  You have a hoarse voice along with difficulty swallowing. MAKE SURE YOU:  Understand these instructions.  Will watch your  condition.  Will get help right away if you are not doing well or get worse. Document Released: 04/22/2000 Document Revised: 12/26/2012 Document Reviewed: 10/12/2012 [facility] Patient Information 2014 [place].  GETTING TO GOOD BOWEL HEALTH. Irregular bowel habits such as constipation and diarrhea can lead to many problems over time.  Having one soft bowel movement a day is the most important way to prevent further problems.  The anorectal canal is designed to handle stretching and feces to safely manage our ability to get rid of solid waste (feces, poop, stool) out of our body.  BUT, hard constipated stools can act like ripping concrete bricks and diarrhea can be a burning fire to this very sensitive area of our body, causing inflamed hemorrhoids, anal fissures, increasing risk is perirectal abscesses, abdominal pain/bloating, an making irritable bowel worse.     The goal: ONE SOFT BOWEL MOVEMENT A DAY!  To have soft, regular bowel movements:    Drink at least 8 tall glasses of water a day.     Take plenty of fiber.  Fiber is the undigested part of plant food that passes into the colon, acting s natures broom to encourage bowel motility and movement.  Fiber can absorb and hold large amounts of water. This results in a larger, bulkier stool, which is soft and easier to pass. Work gradually over several weeks up to 6 servings a day of fiber (25g a day even more if needed) in the form of: o Vegetables -- Root (potatoes, carrots, turnips), leafy green (lettuce, salad greens, celery, spinach), or cooked high residue (cabbage, broccoli, etc) o Fruit -- Fresh (unpeeled skin & pulp), Dried (prunes, apricots, cherries, etc ),  or stewed ( applesauce)  o Whole grain breads, pasta, etc (  whole wheat)  o Bran cereals    Bulking Agents -- This type of water-retaining fiber generally is easily obtained each day by one of the following:  o Psyllium bran -- The psyllium plant is remarkable because its ground  seeds can retain so much water. This product is available as Metamucil, Konsyl, Effersyllium, Per Diem Fiber, or the less expensive generic preparation in drug and health food stores. Although labeled a laxative, it really is not a laxative.  o Methylcellulose -- This is another fiber derived from wood which also retains water. It is available as Citrucel. o Polyethylene Glycol - and artificial fiber commonly called Miralax or Glycolax.  It is helpful for people with gassy or bloated feelings with regular fiber o Flax Seed - a less gassy fiber than psyllium   No reading or other relaxing activity while on the toilet. If bowel movements take longer than 5 minutes, you are too constipated   AVOID CONSTIPATION.  High fiber and water intake usually takes care of this.  Sometimes a laxative is needed to stimulate more frequent bowel movements, but    Laxatives are not a good long-term solution as it can wear the colon out. o Osmotics (Milk of Magnesia, Fleets phosphosoda, Magnesium citrate, MiraLax, GoLytely) are safer than  o Stimulants (Senokot, Castor Oil, Dulcolax, Ex Lax)    o Do not take laxatives for more than 7days in a row.    IF SEVERELY CONSTIPATED, try a Bowel Retraining Program: o Do not use laxatives.  o Eat a diet high in roughage, such as bran cereals and leafy vegetables.  o Drink six (6) ounces of prune or apricot juice each morning.  o Eat two (2) large servings of stewed fruit each day.  o Take one (1) heaping tablespoon of a psyllium-based bulking agent twice a day. Use sugar-free sweetener when possible to avoid excessive calories.  o Eat a normal breakfast.  o Set aside 15 minutes after breakfast to sit on the toilet, but do not strain to have a bowel movement.  o If you do not have a bowel movement by the third day, use an enema and repeat the above steps.    Controlling diarrhea o Switch to liquids and simpler foods for a few days to avoid stressing your intestines  further. o Avoid dairy products (especially milk & ice cream) for a short time.  The intestines often can lose the ability to digest lactose when stressed. o Avoid foods that cause gassiness or bloating.  Typical foods include beans and other legumes, cabbage, broccoli, and dairy foods.  Every person has some sensitivity to other foods, so listen to our body and avoid those foods that trigger problems for you. o Adding fiber (Citrucel, Metamucil, psyllium, Miralax) gradually can help thicken stools by absorbing excess fluid and retrain the intestines to act more normally.  Slowly increase the dose over a few weeks.  Too much fiber too soon can backfire and cause cramping & bloating. o Probiotics (such as active yogurt, Align, etc) may help repopulate the intestines and colon with normal bacteria and calm down a sensitive digestive tract.  Most studies show it to be of mild help, though, and such products can be costly. o Medicines:   Bismuth subsalicylate (ex. Kayopectate, Pepto Bismol) every 30 minutes for up to 6 doses can help control diarrhea.  Avoid if pregnant.   Loperamide (Immodium) can slow down diarrhea.  Start with two tablets (4mg  total) first and then try  one tablet every 6 hours.  Avoid if you are having fevers or severe pain.  If you are not better or start feeling worse, stop all medicines and call your doctor for advice o Call your doctor if you are getting worse or not better.  Sometimes further testing (cultures, endoscopy, X-ray studies, bloodwork, etc) may be needed to help diagnose and treat the cause of the diarrhea. o

## 2013-05-17 NOTE — Discharge Summary (Signed)
Physician Discharge Summary  Patient ID: [name] [name] MRN: [id number] DOB/AGE: 08/25/1935 78 y.o.  Admit date: 05/13/2013 Discharge date: 05/17/2013  Admission Diagnoses:  Discharge Diagnoses:  Principal Problem:   Dysphagia Active Problems:   HYPERTENSION   CAD, UNSPECIFIED SITE   ASTHMA   Incarcerated recurrent paraesophageal hiatal hernia s/p lap redo repair [id number]   MGUS (monoclonal gammopathy of unknown significance)   Anxiety   Protein-calorie malnutrition, severe   Nausea   Discharged Condition: good  Hospital Course: Pleasant woman with anxiety and chronic nausea.  At recurrent paraesophageal hiatal hernia.  I laparoscopically fixed that.  She returned emergency room with worsening by mouth tolerance.  Swallow study concerning for a tight wrap.  She was made n.p.o.  Placed on IV steroids.  Dysphasia seemed to lessen.  She was started on sips and eventually advanced from liquids to a pured diet.  She had many questions.  I had pharmacy in nutrition and myself making concerted effort to educate her as best as possible.  She mobilized in the hallways and advanced to a pureed diet gradually.  Pain was well-controlled and transitioned off IV medications.    By the time of discharge, the patient was walking well the hallways, eating food well, having flatus.  Pain was-controlled on an oral regimen.  Based on meeting DC criteria and recovering well, I felt it was safe for the patient to be discharged home with close followup.  Instructions were discussed in detail.  They are written as well.     Consults: None  Significant Diagnostic Studies:   Ct Chest W Contrast  05/13/2013   CLINICAL DATA:  Nausea and abdominal pain. Hiatal hernia repair 3 weeks prior. Rule out abscess.  EXAM: CT CHEST, ABDOMEN, AND PELVIS WITH CONTRAST  TECHNIQUE: Multidetector CT imaging of the chest, abdomen and pelvis was performed following the standard protocol during bolus administration of intravenous  contrast.  CONTRAST:  OMNIPAQUE IOHEXOL 300 MG/ML  SOLN  COMPARISON:  Chest CT 03/12/2013.  FINDINGS: CT CHEST FINDINGS  THORACIC INLET/BODY WALL:  No acute abnormality.  MEDIASTINUM:  Normal heart size. No pericardial effusion. Diffuse atherosclerosis, including the coronary arteries. There is suggestion of subendocardial fibro fatty scarring along the LAD distribution, compatible with remote infarct. No evidence of acute vascular abnormality.  Interval repair of recurrent hiatal hernia. There is a tight Nissen wrap with extra luminal high-density material consistent with pledgets (rather than extra luminal contrast). The distal esophagus and gastric wrap is thickened. There is fluid accumulating around the posterior and right lateral distal esophagus measuring approximately 2 x 1 x 1.5 cm. No well-defined enhancing wall or surrounding fat infiltration.  LUNG WINDOWS:  No consolidation.  No effusion.  No suspicious pulmonary nodule.  OSSEOUS:  No acute fracture.  No suspicious lytic or blastic lesions.  CT ABDOMEN AND PELVIS FINDINGS  BODY WALL: Pelvic floor laxity with rectal prolapse.  Liver: Atrophy of the lateral segment left lobe liver, with intrahepatic biliary ductal dilatation.  Biliary: Cholecystectomy.  Chronic, diffuse biliary ductal dilation.  Pancreas: Unremarkable.  Spleen: No focal abnormality.  Adrenals: Unremarkable.  Kidneys and ureters: No hydronephrosis or stone. Tiny presumed cyst in the interpolar left kidney.  Bladder: Unremarkable.  Reproductive: Hysterectomy.  Bowel: No obstruction.  Retroperitoneum: No mass or adenopathy.  Peritoneum: No free fluid or gas.  Vascular: No acute abnormality.  OSSEOUS: Unchanged linear sclerosis in the left more than right sacral ala, consistent with previous insufficiency fractures. Remote L1 and L3 compression  fractures with unchanged height loss greater than 50%. Remote 12 superior endplate fracture. L4-5 anterolisthesis related to advanced facet  osteoarthritis. No acute fracture seen  IMPRESSION: 1. Distal esophageal thickening/inflammation status post recent hiatal hernia repair with Nissen fundoplication. There is a 2 x 1 x 1.5 cm fluid collection along the lower right esophagus which is likely incidental/postoperative, but is sterility indeterminate by imaging. No evidence of esophageal leak. 2. Chronic intra-abdominal and intrathoracic findings are stable from recent CT comparisons, described above.   Electronically Signed   By: Jorje Guild M.D.   On: 05/13/2013 07:13   Ct Abdomen Pelvis W Contrast  05/13/2013   CLINICAL DATA:  Nausea and abdominal pain. Hiatal hernia repair 3 weeks prior. Rule out abscess.  EXAM: CT CHEST, ABDOMEN, AND PELVIS WITH CONTRAST  TECHNIQUE: Multidetector CT imaging of the chest, abdomen and pelvis was performed following the standard protocol during bolus administration of intravenous contrast.  CONTRAST:  111mL OMNIPAQUE IOHEXOL 300 MG/ML  SOLN  COMPARISON:  Chest CT 03/12/2013.  FINDINGS: CT CHEST FINDINGS  THORACIC INLET/BODY WALL:  No acute abnormality.  MEDIASTINUM:  Normal heart size. No pericardial effusion. Diffuse atherosclerosis, including the coronary arteries. There is suggestion of subendocardial fibro fatty scarring along the LAD distribution, compatible with remote infarct. No evidence of acute vascular abnormality.  Interval repair of recurrent hiatal hernia. There is a tight Nissen wrap with extra luminal high-density material consistent with pledgets (rather than extra luminal contrast). The distal esophagus and gastric wrap is thickened. There is fluid accumulating around the posterior and right lateral distal esophagus measuring approximately 2 x 1 x 1.5 cm. No well-defined enhancing wall or surrounding fat infiltration.  LUNG WINDOWS:  No consolidation.  No effusion.  No suspicious pulmonary nodule.  OSSEOUS:  No acute fracture.  No suspicious lytic or blastic lesions.  CT ABDOMEN AND PELVIS  FINDINGS  BODY WALL: Pelvic floor laxity with rectal prolapse.  Liver: Atrophy of the lateral segment left lobe liver, with intrahepatic biliary ductal dilatation.  Biliary: Cholecystectomy.  Chronic, diffuse biliary ductal dilation.  Pancreas: Unremarkable.  Spleen: No focal abnormality.  Adrenals: Unremarkable.  Kidneys and ureters: No hydronephrosis or stone. Tiny presumed cyst in the interpolar left kidney.  Bladder: Unremarkable.  Reproductive: Hysterectomy.  Bowel: No obstruction.  Retroperitoneum: No mass or adenopathy.  Peritoneum: No free fluid or gas.  Vascular: No acute abnormality.  OSSEOUS: Unchanged linear sclerosis in the left more than right sacral ala, consistent with previous insufficiency fractures. Remote L1 and L3 compression fractures with unchanged height loss greater than 50%. Remote 12 superior endplate fracture. L4-5 anterolisthesis related to advanced facet osteoarthritis. No acute fracture seen  IMPRESSION: 1. Distal esophageal thickening/inflammation status post recent hiatal hernia repair with Nissen fundoplication. There is a 2 x 1 x 1.5 cm fluid collection along the lower right esophagus which is likely incidental/postoperative, but is sterility indeterminate by imaging. No evidence of esophageal leak. 2. Chronic intra-abdominal and intrathoracic findings are stable from recent CT comparisons, described above.   Electronically Signed   By: Jorje Guild M.D.   On: 05/13/2013 07:13   Dg Abd Acute W/chest  05/13/2013   CLINICAL DATA:  Right lower quadrant pain, recent hiatal hernia fundoplication.  EXAM: ACUTE ABDOMEN SERIES (ABDOMEN 2 VIEW & CHEST 1 VIEW)  COMPARISON:  Acute abdominal series April 22, 2013  FINDINGS: Cardiomediastinal silhouette is nonsuspicious, unchanged. Mild chronic interstitial changes with scarring in left lung base. No pleural effusions or focal consolidations. No pneumothorax.  Bowel gas pattern is nondilated and nonobstructive. Surgical clips in the  right abdomen likely reflect cholecystectomy. Surgical clips at the gastroesophageal junction. No intra-abdominal mass effect or pathologic calcifications. No free air. Phleboliths in the pelvis. Patient is osteopenic. Mild levoscoliosis of the lumbar spine is unchanged. Mild L1 compression fracture may be new, chronic L3 moderate to severe compression deformity.  IMPRESSION: No acute cardiopulmonary process.  Nonspecific bowel gas pattern.  Osteopenia, chronic L3 moderate compression fracture with new appearing L1 compression fracture, recommend correlation with point tenderness.   Electronically Signed   By: Awilda Metro   On: 05/13/2013 02:03   Dg Esophagus W/water Sol Cm  05/14/2013   CLINICAL DATA:  Post redo fundoplication.  EXAM: ESOPHOGRAM/BARIUM SWALLOW  TECHNIQUE: Single contrast examination was performed using  water soluble.  COMPARISON:  05/13/2013 CT.  FLUOROSCOPY TIME:  2 min and 5 seconds  FINDINGS: Radiopaque material at the gastroesophageal junction consistent with material utilized for procedure in addition to surgical clips.  Circumferential narrowing at the reconstructed gastroesophageal junction without leak identified.  There is slow transit time through this region which may be partially explained by postoperative edema in addition to the operative procedure.  With the patient in an upright position, ingested contrast remains within the distal esophagus above this region of circumferential narrowing but subsequently clears with dry swallowing.  Remote compression fracture L1 and L3 vertebral body incompletely assessed.  IMPRESSION: Radiopaque material at the gastroesophageal junction consistent with material utilized for procedure in addition to surgical clips.  Circumferential narrowing at the reconstructed gastroesophageal junction without leak identified.  There is slow transit time through this region which may be partially explained by postoperative edema in addition to the  operative procedure.  With the patient in an upright position, ingested contrast remains within the distal esophagus above this region of circumferential narrowing but subsequently clears with dry swallowing.  This is a call report.   Electronically Signed   By: Bridgett Larsson M.D.   On: 05/14/2013 10:36    Treatments: IV meds  Discharge Exam: Blood pressure 139/74, pulse 79, temperature 97.9 F (36.6 C), temperature source Oral, resp. rate 16, height 5\' 5"  (1.651 m), weight 137 lb (62.143 kg), SpO2 96.00%.  General: Pt awake/alert/oriented x4 in no acute distress  Eyes: PERRL, normal EOM. Sclera clear. No icterus  Neuro: CN II-XII intact w/o focal sensory/motor deficits.  Lymph: No head/neck/groin lymphadenopathy  Psych: No delerium/psychosis/paranoia. Calm. Smiling  HENT: Normocephalic, Mucus membranes dry. Old blood on Kleenex tissue. No active bleeding. No stridor. No conversational or audible dyspnea.  Neck: Supple, No tracheal deviation  Chest: No chest wall pain w good excursion  CV: Pulses intact. Regular rhythm  MS: Normal AROM mjr joints. No obvious deformity  Abdomen: Soft. Nondistended. Nontender at epigastric region. No evidence of peritonitis. No incarcerated hernias.  Ext: SCDs BLE. No mjr edema. No cyanosis  Skin: No petechiae / purpura   Disposition: 01-Home or Self Care  Discharge Orders   Future Appointments Provider Department Dept Phone   05/29/2013 11:45 AM Ardeth Sportsman, MD [facility], [place] [phone]   08/14/2013 10:45 AM Lucky Cowboy, MD [facility] [phone]   08/26/2013 9:30 AM [facility] [phone]   08/28/2013 9:30 AM Si Gaul, MD [facility] MED[facility] ONCOL[facility] [phone]   02/12/2014 10:00 AM Lucky Cowboy, MD [place] [facility] [phone]   Future Orders Complete By Expires  Call MD for:  extreme  fatigue  As directed    Call MD for:  hives  As directed    Call MD for:  persistant nausea and vomiting  As directed    Call MD for:  redness, tenderness, or signs of infection (pain, swelling, redness, odor or green/yellow discharge around incision site)  As directed    Call MD for:  severe uncontrolled pain  As directed    Call MD for:  As directed    Comments:     Temperature > 101.43F   Discharge instructions  As directed    Comments:     Please see discharge instruction sheets.  Also refer to handout given an office.  Please call our office if you have any questions or concerns [phone]   Discharge wound care:  As directed    Comments:     If you have closed incisions, shower and bathe over these incisions with soap and water every day.  Remove all surgical dressings on postoperative day #3.  You do not need to replace dressings over the closed incisions unless you feel more comfortable with a Band-Aid covering it.   If you have an open wound that requires packing, please see wound care instructions.  In general, remove all dressings, wash wound with soap and water and then replace with saline moistened gauze.  Do the dressing change at least every day.  Please call our office [phone] if you have further questions.   Driving Restrictions  As directed    Comments:     No driving until off narcotics and can safely swerve away without pain during an emergency   Increase activity slowly  As directed    Comments:     Walk an hour a day.  Use 20-30 minute walks.  When you can walk 30 minutes without difficulty, increase to low impact/moderate activities such as biking, jogging, swimming, sexual activity..  Eventually can increase to unrestricted activity when not feeling pain.  If you feel pain: STOP![name]   Let pain protect you from overdoing it.  Use ice/heat/over-the-counter pain medications to help minimize his soreness.  Use pain prescriptions as needed to remain active.  It is better  to take extra pain medications and be more active than to stay bedridden to avoid all pain medications.   Lifting restrictions  As directed    Comments:     Avoid heavy lifting initially.  Do not push through pain.  You have no specific weight limit.  Coughing and sneezing or four more stressful to your incision than any lifting you will do. Pain will protect you from injury.  Therefore, avoid intense activity until off all narcotic pain medications.  Coughing and sneezing or four more stressful to your incision than any lifting he will do.   May shower / Bathe  As directed    May walk up steps  As directed    Sexual Activity Restrictions  As directed    Comments:     Sexual activity as tolerated.  Do not push through pain.  Pain will protect you from injury.   Walk with assistance  As directed    Comments:     Walk over an hour a day.  May use a walker/cane/companion to help with balance and stamina.       Medication List    STOP taking these medications       promethazine 12.5 MG tablet  Commonly known as:  PHENERGAN  sucralfate 1 G tablet  Commonly known as:  CARAFATE      TAKE these medications       ALPRAZolam 0.5 MG tablet  Commonly known as:  XANAX  Take 0.5 mg by mouth 3 (three) times daily as needed for sleep or anxiety.     amLODipine 10 MG tablet  Commonly known as:  NORVASC  Take 10 mg by mouth every morning.     calcitonin (salmon) 200 UNIT/ACT nasal spray  Commonly known as:  MIACALCIN/FORTICAL  Place 1 spray into the nose every morning. She alternates nostrils every other day.     DSS 100 MG Caps  Take 100 mg by mouth 2 (two) times daily.     losartan 100 MG tablet  Commonly known as:  COZAAR  Take 100 mg by mouth every morning.     metoCLOPramide 10 MG tablet  Commonly known as:  REGLAN  Take 1 tablet (10 mg total) by mouth every 6 (six) hours as needed (Nausea).     metoprolol tartrate 25 MG tablet  Commonly known as:  LOPRESSOR  Take 12.5 mg  by mouth at bedtime.     mometasone-formoterol 200-5 MCG/ACT Aero  Commonly known as:  DULERA  Inhale 2 puffs into the lungs 2 (two) times daily.     multivitamin with minerals Tabs tablet  Take 1 tablet by mouth every morning.     ondansetron 4 MG tablet  Commonly known as:  ZOFRAN  Take 1 tablet (4 mg total) by mouth every 8 (eight) hours as needed for nausea or vomiting.     OVER THE COUNTER MEDICATION  Take 1,000 mg by mouth every other day. Coral Calcium 1000mg      OVER THE COUNTER MEDICATION  Take 400 mg by mouth 3 (three) times daily. Magnesium Carbonate 400mg      pantoprazole 40 MG tablet  Commonly known as:  PROTONIX  Take 40 mg by mouth 2 (two) times daily.     potassium chloride 10 MEQ tablet  Commonly known as:  K-DUR  Take 1 tablet (10 mEq total) by mouth 2 (two) times daily.     pravastatin 40 MG tablet  Commonly known as:  PRAVACHOL  Take 40 mg by mouth at bedtime.     senna 8.6 MG Tabs tablet  Commonly known as:  SENOKOT  Take 1 tablet (8.6 mg total) by mouth daily.     traMADol 50 MG tablet  Commonly known as:  ULTRAM  Take 1-2 tablets (50-100 mg total) by mouth every 6 (six) hours as needed for moderate pain or severe pain.     TUDORZA PRESSAIR 400 MCG/ACT Aepb  Generic drug:  Aclidinium Bromide  Inhale 1 puff into the lungs 2 (two) times daily.     VITAMIN B 12 PO  Take 1 tablet by mouth every morning.     Vitamin D-3 1000 UNITS Caps  Take 1 capsule by mouth 2 (two) times daily. Take with 2,000 unit to = 3,000 units/each dose     Vitamin D3 2000 UNITS Tabs  Take 1 tablet by mouth 2 (two) times daily. Take with 1,000 unit to = 3,000 units/dose           Follow-up Information   Schedule an appointment as soon as possible for a visit in 2 weeks to follow up.   Contact information:   [address] [place] [address] [phone]       Follow up with [facility]  C., MD.   Specialty:  General Surgery   Contact information:    [address] [place] [address] [phone]       Signed: JORDAN, CARAVEO. 05/17/2013, 6:06 PM

## 2013-05-17 NOTE — Telephone Encounter (Signed)
LMOM giving them the rx for Reglan 5mg  #30 1-2 tabs every 6hrs phoned to [facility] [phone] per Dr Johney Maine.

## 2013-05-20 NOTE — Telephone Encounter (Signed)
Please encourage her to take the antinausea medicines as I told her many times.  That is the purpose of the prescription.

## 2013-05-20 NOTE — Telephone Encounter (Signed)
Patient is having nausea no vomiting this am per husband, patient has not took the Zofran 4mg . Advise for her take the zofran 4 mg and to call if symptoms are not better in 30 minutes.

## 2013-05-27 ENCOUNTER — Telehealth (INDEPENDENT_AMBULATORY_CARE_PROVIDER_SITE_OTHER): Payer: Self-pay | Admitting: *Deleted

## 2013-05-27 ENCOUNTER — Other Ambulatory Visit (INDEPENDENT_AMBULATORY_CARE_PROVIDER_SITE_OTHER): Payer: Self-pay | Admitting: *Deleted

## 2013-05-27 MED ORDER — TRAMADOL HCL 50 MG PO TABS: 50.0000 mg | ORAL_TABLET | Freq: Four times a day (QID) | ORAL | Status: DC | PRN

## 2013-05-27 NOTE — Telephone Encounter (Signed)
Prescription called into [facility] per patient's request at this time.  Melanie, pharmacist, was given below information.  Patient updated that prescription is being filled at this time.

## 2013-05-27 NOTE — Telephone Encounter (Signed)
Refill it.  Tramadol 1-2 q6h prn prn #40

## 2013-05-27 NOTE — Telephone Encounter (Signed)
Patient called to ask for a refill of Tramadol.  Patient had hiatal hernia repair on 04/24/13.  Patient states it is the same continued back pain she has been having.  Patient denies any issues with fevers, signs of infection or difficulty with BMs.  Explained that I would send a message to Dr. Johney Maine to ask his opinion of this then we will let her know.  Patient states understanding.

## 2013-05-29 ENCOUNTER — Encounter (INDEPENDENT_AMBULATORY_CARE_PROVIDER_SITE_OTHER): Payer: Self-pay

## 2013-05-29 ENCOUNTER — Encounter (INDEPENDENT_AMBULATORY_CARE_PROVIDER_SITE_OTHER): Payer: Self-pay | Admitting: Surgery

## 2013-05-29 ENCOUNTER — Ambulatory Visit (INDEPENDENT_AMBULATORY_CARE_PROVIDER_SITE_OTHER): Payer: Medicare HMO | Admitting: Surgery

## 2013-05-29 VITALS — BP 146/96 | HR 72 | Temp 97.8°F | Resp 18 | Ht 65.0 in | Wt 137.0 lb

## 2013-05-29 DIAGNOSIS — M5126 Other intervertebral disc displacement, lumbar region: Secondary | ICD-10-CM

## 2013-05-29 DIAGNOSIS — K5900 Constipation, unspecified: Secondary | ICD-10-CM

## 2013-05-29 DIAGNOSIS — R110 Nausea: Secondary | ICD-10-CM

## 2013-05-29 DIAGNOSIS — K5909 Other constipation: Secondary | ICD-10-CM

## 2013-05-29 DIAGNOSIS — K440 Diaphragmatic hernia with obstruction, without gangrene: Secondary | ICD-10-CM

## 2013-05-29 NOTE — Patient Instructions (Signed)
EATING AFTER YOUR ESOPHAGEAL SURGERY (Stomach Fundoplication, Hiatal Hernia repair, Achalasia surgery, etc)  After your esophageal surgery, expect some sticking with swallowing over the next 1-2 months.    If food sticks when you eat, it is called "dysphagia".  This is due to swelling around your esophagus at the wrap & hiatal diaphragm repair.  It will gradually ease off over the next few months.  To help you through this temporary phase, we start you out on a pureed (blenderized) diet.  Your first meal in the hospital was thin liquids.  You should have been given a pureed diet by the time you left the hospital.  We ask patients to stay on a pureed diet for the first 2-3 weeks to avoid anything getting "stuck" near your recent surgery.  Don't be alarmed if your ability to swallow doesn't progress according to this plan.  Everyone is different and some diets can advance more or less quickly.     Some BASIC RULES to follow are:  Maintain an upright position whenever eating or drinking.  Take small bites - just a teaspoon size bite at a time.  Eat slowly.  It may also help to eat only one food at a time.  Consider nibbling through smaller, more frequent meals & avoid the urge to eat BIG meals  Do not push through feelings of fullness, nausea, or bloatedness  Do not mix solid foods and liquids in the same mouthful  Try not to "wash foods down" with large gulps of liquids. Avoid carbonated (bubbly/fizzy) drinks.  Understand that it will be hard to burp and belch at first.  This gradually improves with time.  Expect to be more gassy/flatulent/bloated initially.  Walking will help you work through that.  Maalox/Gas-X can help as well.  Eat in a relaxed atmosphere & minimize distractions.  Avoid talking while eating.    Do not use straws.  Following each meal, sit in an upright position (90 degree angle) for 60 to 90 minutes.  Going for a short walk can help as well  If food does stick,  don't panic.  Try to relax and let the food pass on its own.  Sipping WARM LIQUID such as strong hot black tea can also help slide it down.   Be gradual in changes & use common sense:  -If you easily tolerating a certain "level" of foods, advance to the next level gradually -If you are having trouble swallowing a particular food, then avoid it.   -If food is sticking when you advance your diet, go back to thinner previous diet (the lower LEVEL) for 1-2 days.  LEVEL 1 = PUREED DIET  Do for the first 2 WEEKS AFTER SURGERY  -Foods in this group are pureed or blenderized to a smooth, mashed potato-like consistency.  -If necessary, the pureed foods can keep their shape with the addition of a thickening agent.   -Meat should be pureed to a smooth, pasty consistency.  Hot broth or gravy may be added to the pureed meat, approximately 1 oz. of liquid per 3 oz. serving of meat. -CAUTION:  If any foods do not puree into a smooth consistency, swallowing will be more difficult.  (For example, nuts or seeds sometimes do not blend well.)  Hot Foods Cold Foods  Pureed scrambled eggs and cheese Pureed cottage cheese  Baby cereals Thickened juices and nectars  Thinned cooked cereals (no lumps) Thickened milk or eggnog  Pureed [place] toast or pancakes Ensure  Mashed  potatoes Ice cream  Pureed parsley, au gratin, scalloped potatoes, candied sweet potatoes Fruit or [place] ice, sherbet  Pureed buttered or alfredo noodles Plain yogurt  Pureed vegetables (no corn or peas) Instant breakfast  Pureed soups and creamed soups Smooth pudding, mousse, custard  Pureed scalloped apples Whipped gelatin  Gravies Sugar, syrup, honey, jelly  Sauces, cheese, tomato, barbecue, white, creamed Cream  Any baby food Creamer  Alcohol in moderation (not beer or champagne) Margarine  Coffee or tea Mayonnaise   Ketchup, mustard   Apple sauce   SAMPLE MENU:  PUREED DIET Breakfast Lunch Dinner   Orange juice, 1/2  cup  Cream of wheat, 1/2 cup  Pineapple juice, 1/2 cup  Pureed [place], barley soup, 3/4 cup  Pureed Hawaiian chicken, 3 oz   Scrambled eggs, mashed or blended with cheese, 1/2 cup  Tea or coffee, 1 cup   Whole milk, 1 cup   Non-dairy creamer, 2 Tbsp.  Mashed potatoes, 1/2 cup  Pureed cooled broccoli, 1/2 cup  Apple sauce, 1/2 cup  Coffee or tea  Mashed potatoes, 1/2 cup  Pureed spinach, 1/2 cup  Frozen yogurt, 1/2 cup  Tea or coffee      LEVEL 2 = SOFT DIET  After your first 2 weeks, you can advance to a soft diet.   Keep on this diet until everything goes down easily.  Hot Foods Cold Foods  White fish Cottage cheese  Stuffed fish Junior baby fruit  Baby food meals Semi thickened juices  Minced soft cooked, scrambled, poached eggs nectars  Souffle & omelets Ripe mashed bananas  Cooked cereals Canned fruit, pineapple sauce, milk  potatoes Milkshake  Buttered or Alfredo noodles Custard  Cooked cooled vegetable Puddings, including tapioca  Sherbet Yogurt  Vegetable soup or alphabet soup Fruit ice, [place] ice  Gravies Whipped gelatin  Sugar, syrup, honey, jelly Junior baby desserts  Sauces:  Cheese, creamed, barbecue, tomato, white Cream  Coffee or tea Margarine   SAMPLE MENU:  LEVEL 2 Breakfast Lunch Dinner   Orange juice, 1/2 cup  Oatmeal, 1/2 cup  Scrambled eggs with cheese, 1/2 cup  Decaffeinated tea, 1 cup  Whole milk, 1 cup  Non-dairy creamer, 2 Tbsp  Pineapple juice, 1/2 cup  Minced beef, 3 oz  Gravy, 2 Tbsp  Mashed potatoes, 1/2 cup  Minced fresh broccoli, 1/2 cup  Applesauce, 1/2 cup  Coffee, 1 cup  [place], barley soup, 3/4 cup  Minced Hawaiian chicken, 3 oz  Mashed potatoes, 1/2 cup  Cooked spinach, 1/2 cup  Frozen yogurt, 1/2 cup  Non-dairy creamer, 2 Tbsp      LEVEL 3 = CHOPPED DIET  -After all the foods in level 2 (soft diet) are passing through well you should advance up to more chopped foods.  -It is still  important to cut these foods into small pieces and eat slowly.  Hot Foods Cold Foods  Poultry Cottage cheese  Chopped Swedish meatballs Yogurt  Meat salads (ground or flaked meat) Milk  Flaked fish (tuna) Milkshakes  Poached or scrambled eggs Soft, cold, dry cereal  Souffles and omelets Fruit juices or nectars  Cooked cereals Chopped canned fruit  Chopped [place] toast or pancakes Canned fruit cocktail  Noodles or pasta (no rice) Pudding, mousse, custard  Cooked vegetables (no frozen peas, corn, or mixed vegetables) Green salad  Canned small sweet peas Ice cream  Creamed soup or vegetable soup Fruit ice, [place] ice  Pureed vegetable soup or alphabet soup Non-dairy creamer  Ground scalloped  apples Margarine  Gravies Mayonnaise  Sauces:  Cheese, creamed, barbecue, tomato, white Ketchup  Coffee or tea Mustard   SAMPLE MENU:  LEVEL 3 Breakfast Lunch Dinner   Orange juice, 1/2 cup  Oatmeal, 1/2 cup  Scrambled eggs with cheese, 1/2 cup  Decaffeinated tea, 1 cup  Whole milk, 1 cup  Non-dairy creamer, 2 Tbsp  Ketchup, 1 Tbsp  Margarine, 1 tsp  Salt, 1/4 tsp  Sugar, 2 tsp  Pineapple juice, 1/2 cup  Ground beef, 3 oz  Gravy, 2 Tbsp  Mashed potatoes, 1/2 cup  Cooked spinach, 1/2 cup  Applesauce, 1/2 cup  Decaffeinated coffee  Whole milk  Non-dairy creamer, 2 Tbsp  Margarine, 1 tsp  Salt, 1/4 tsp  Pureed [place], barley soup, 3/4 cup  Barbecue chicken, 3 oz  Mashed potatoes, 1/2 cup  Ground fresh broccoli, 1/2 cup  Frozen yogurt, 1/2 cup  Decaffeinated tea, 1 cup  Non-dairy creamer, 2 Tbsp  Margarine, 1 tsp  Salt, 1/4 tsp  Sugar, 1 tsp    LEVEL 4:  REGULAR FOODS  -Foods in this group are soft, moist, regularly textured foods.   -This level includes meat and breads, which tend to be the hardest things to swallow.   -Eat very slowly, chew well and continue to avoid carbonated drinks. -most people are at this level in 4-6 weeks  Hot Foods  Cold Foods  Baked fish or skinned Soft cheeses - cottage cheese  Souffles and omelets Cream cheese  Eggs Yogurt  Stuffed shells Milk  Spaghetti with meat sauce Milkshakes  Cooked cereal Cold dry cereals (no nuts, dried fruit, coconut)  [place] toast or pancakes Crackers  Buttered toast Fruit juices or nectars  Noodles or pasta (no rice) Canned fruit  Potatoes (all types) Ripe bananas  Soft, cooked vegetables (no corn, lima, or baked beans) Peeled, ripe, fresh fruit  Creamed soups or vegetable soup Cakes (no nuts, dried fruit, coconut)  Canned chicken noodle soup Plain doughnuts  Gravies Ice cream  Bacon dressing Pudding, mousse, custard  Sauces:  Cheese, creamed, barbecue, tomato, white Fruit ice, [place] ice, sherbet  Decaffeinated tea or coffee Whipped gelatin  Pork chops Regular gelatin   Canned fruited gelatin molds   Sugar, syrup, honey, jam, jelly   Cream   Non-dairy   Margarine   Oil   Mayonnaise   Ketchup   Mustard    If you have any questions please call our office at [place]: [phone].  Managing Pain  Pain after surgery or related to activity is often due to strain/injury to muscle, tendon, nerves and/or incisions.  This pain is usually short-term and will improve in a few months.   Many people find it helpful to do the following things TOGETHER to help speed the process of healing and to get back to regular activity more quickly:  1. Avoid heavy physical activity a.  no lifting greater than 20 pounds b. Do not "push through" the pain.  Listen to your body and avoid positions and maneuvers than reproduce the pain c. Walking is okay as tolerated, but go slowly and stop when getting sore.  d. Remember: If it hurts to do it, then don't do it! 2. Take Anti-inflammatory medication  a. Take with food/snack around the clock for 1-2 weeks i. This helps the muscle and nerve tissues become less irritable and calm down faster b. Choose ONE of the  following over-the-counter medications: i. Naproxen 220mg  tabs (ex. Aleve) 1-2 pills twice a day  ii. Ibuprofen 200mg  tabs (ex. Advil, Motrin) 3-4 pills with every meal and just before bedtime iii. Acetaminophen 500mg  tabs (Tylenol) 1-2 pills with every meal and just before bedtime 3. Use a Heating pad or Ice/Cold Pack a. 4-6 times a day b. May use warm bath/hottub  or showers 4. Try Gentle Massage and/or Stretching  a. at the area of pain many times a day b. stop if you feel pain - do not overdo it  Try these steps together to help you body heal faster and avoid making things get worse.  Doing just one of these things may not be enough.    If you are not getting better after two weeks or are noticing you are getting worse, contact our office for further advice; we may need to re-evaluate you & see what other things we can do to help.  See Dr. Louanne Skye. for your lower back pain  Lumbosacral Strain Lumbosacral strain is a strain of any of the parts that make up your lumbosacral vertebrae. Your lumbosacral vertebrae are the bones that make up the lower third of your backbone. Your lumbosacral vertebrae are held together by muscles and tough, fibrous tissue (ligaments).  CAUSES  A sudden blow to your back can cause lumbosacral strain. Also, anything that causes an excessive stretch of the muscles in the low back can cause this strain. This is typically seen when people exert themselves strenuously, fall, lift heavy objects, bend, or crouch repeatedly. RISK FACTORS  Physically demanding work.  Participation in pushing or pulling sports or sports that require sudden twist of the back (tennis, golf, baseball).  Weight lifting.  Excessive lower back curvature.  Forward-tilted pelvis.  Weak back or abdominal muscles or both.  Tight hamstrings. SIGNS AND SYMPTOMS  Lumbosacral strain may cause pain in the area of your injury or pain that moves (radiates) down your leg.  DIAGNOSIS Your  health care provider can often diagnose lumbosacral strain through a physical exam. In some cases, you may need tests such as X-ray exams.  TREATMENT  Treatment for your lower back injury depends on many factors that your clinician will have to evaluate. However, most treatment will include the use of anti-inflammatory medicines. HOME CARE INSTRUCTIONS   Avoid hard physical activities (tennis, racquetball, waterskiing) if you are not in proper physical condition for it. This may aggravate or create problems.  If you have a back problem, avoid sports requiring sudden body movements. Swimming and walking are generally safer activities.  Maintain good posture.  Maintain a healthy weight.  For acute conditions, you may put ice on the injured area.  Put ice in a plastic bag.  Place a towel between your skin and the bag.  Leave the ice on for 20 minutes, 2 3 times a day.  When the low back starts healing, stretching and strengthening exercises may be recommended. SEEK MEDICAL CARE IF:  Your back pain is getting worse.  You experience severe back pain not relieved with medicines. SEEK IMMEDIATE MEDICAL CARE IF:   You have numbness, tingling, weakness, or problems with the use of your arms or legs.  There is a change in bowel or bladder control.  You have increasing pain in any area of the body, including your belly (abdomen).  You notice shortness of breath, dizziness, or feel faint.  You feel sick to your stomach (nauseous), are throwing up (vomiting), or become sweaty.  You notice discoloration of your toes or legs, or your feet get very cold.  MAKE SURE YOU:   Understand these instructions.  Will watch your condition.  Will get help right away if you are not doing well or get worse. Document Released: 02/02/2005 Document Revised: 02/13/2013 Document Reviewed: 12/12/2012 [facility] Patient Information 2014 [place], [place].

## 2013-05-29 NOTE — Progress Notes (Signed)
Subjective:     Patient ID: [name] [name], female   DOB: [birth year], 78 y.o.   MRN: [id number]  HPI  Note: This dictation was prepared with Dragon/digital dictation along with [facility] technology. Any transcriptional errors that result from this process are unintentional.       [name] [name]  Jun 16, 1935 [id number]  Patient Care Team: [name], MD as PCP - General (Internal Medicine) Inda Castle, MD as Consulting Physician (Gastroenterology) Rigoberto Noel, MD as Consulting Physician (Pulmonary Disease) Larey Dresser, MD as Consulting Physician (Cardiology) Adin Hector, MD as Consulting Physician (General Surgery) Jessy Oto, MD as Consulting Physician (Orthopedic Surgery)  Procedure (Date: 04/24/2013):  POST-OPERATIVE DIAGNOSIS: RECURRENT Paraesophageal hiatal hernia refractory to medical management   PROCEDURE: Procedure(s):  LAPAROSCOPIC LYSIS OF ADHESIONS x 2hours (1/2 case)  Type II mediastinal dissection.  Laparoscopic reduction of paraesophageal hiatal hernia  Primary repair of hiatal hernia over pledgets.  INSERTION OF MESH UltraPro over crural closure and biologic over the esophageal hiatus  Anterior & posterior gastropexy.  Nissen fundoplication 2cm over a 70-[id number] bougie   SURGEON: Surgeon(s):  Adin Hector, MD   This patient returns for surgical re-evaluation.  She comes in with her husband.  She seems discouraged.  She complains of rather sharp lower back pain.  History of chronic low back pain followed by Dr. Louanne Skye.  Wonders if okay to go have that worked up.  Bounce back in the hospital with nausea.  Improved with better nausea regimen.  She denies any abdominal pain.  Mild dysphagia.  Tolerating soft foods well.  Nausea intermittent but controlled with Zofran.  No fevers chills or sweats.  It is very constipated with the narcotics.  Pain not well controlled with Tylenol/heat only.  Not taking any bowel regimen.  Had to take an enema one  time.  Continues her Xanax.  No fevers or chills.  Walking with a walker.  Patient Active Problem List   Diagnosis Date Noted  . Dysphagia 05/15/2013  . Nausea 05/13/2013  . Protein-calorie malnutrition, severe 04/25/2013  . Constipation, chronic 04/03/2013  . Anxiety   . MGUS (monoclonal gammopathy of unknown significance) 03/05/2013  . Herniated lumbar intervertebral disc 06/25/2012    Class: Acute  . Vertebral compression fracture 06/23/2012  . GASTRITIS 05/13/2010  . CAD, UNSPECIFIED SITE 08/28/2008  . ESOPHAGEAL STRICTURE 08/28/2008  . ASTHMA 07/29/2008  . Incarcerated recurrent paraesophageal hiatal hernia s/p lap redo repair [id number] 01/31/2008  . HYPERTENSION 07/25/2007  . HYPERLIPIDEMIA 07/24/2007    Past Medical History  Diagnosis Date  . CAD (coronary artery disease)     LHC 2003 with 40% pLAD, 30% mLAD, 100% D1 (moderate vessel), 100%  D2 (small vessel), 95% mid small OM2.  Pt had PTCA to D1;  D2 and OM2 small vessels and tx medically;  Last Myoview (2012): anterior infarct seen with scar, no ischemia. EF normal. Pt managed medically;  Lexiscan Myoview (12/14):  Low risk; ant scar with very mild peri-infarct ischemia; EF 55% with ant AK   . Recurrent aspiration bronchitis/pneumonia     PFTs 12/09 FVC 97%, FEV1 101%, ratio 77%, TLC 109%. No significant obstruction or restriction.   . Hypertension   . Hyperlipidemia   . Esophageal stricture   . Diverticulosis of colon   . Gastroesophageal reflux disease with hiatal hernia   . Iron deficiency anemia   . Gastritis   . GERD (gastroesophageal reflux disease)   . Hiatal hernia   . Asthma   .  Blood transfusion   . Cough   . Wheezing   . Weakness   . Trouble swallowing   . Abdominal pain   . Hiatal hernia   . Shortness of breath   . Anxiety   . Complication of anesthesia   . PONV (postoperative nausea and vomiting)   . TIA (transient ischemic attack)   . BRONCHITIS, ACUTE 07/25/2007         . DIVERTICULOSIS, COLON  08/28/2008    Qualifier: Diagnosis of  By: [name], [name], [name]    . IRON DEFICIENCY ANEMIA, HX OF 07/24/2007    Qualifier: Diagnosis of  By: Tilden Dome      Past Surgical History  Procedure Laterality Date  . Back surgery    . Cholecystectomy    . Bladder repair    . Hemorrhoid surgery    . Cholecystectomy      Patient unsure of date.  [name] Appendectomy      patient unsure of date  . Total abdominal hysterectomy  1975    partial  . Coronary angioplasty  11/16/2001  . Eye surgery  11/2000    bilateral cataracts with lens implant  . Hiatal hernia repair  06/03/2011    Procedure: LAPAROSCOPIC REPAIR OF HIATAL HERNIA;  Surgeon: Adin Hector, MD;  Location: [facility];  Service: General;  Laterality: N/A;  . Laparoscopic nissen fundoplication  [id number]    Procedure: LAPAROSCOPIC NISSEN FUNDOPLICATION;  Surgeon: Adin Hector, MD;  Location: [facility];  Service: General;  Laterality: N/A;  . Lumbar laminectomy/decompression microdiscectomy Right 06/26/2012    Procedure: LUMBAR LAMINECTOMY/DECOMPRESSION MICRODISCECTOMY;  Surgeon: Jessy Oto, MD;  Location: [facility];  Service: Orthopedics;  Laterality: Right;  RIGHT L4-5 MICRODISCECTOMY  . Esophagogastroduodenoscopy N/A 03/15/2013    Procedure: ESOPHAGOGASTRODUODENOSCOPY (EGD);  Surgeon: Jerene Bears, MD;  Location: [facility];  Service: Endoscopy;  Laterality: N/A;  . Esophageal manometry N/A 03/25/2013    Procedure: ESOPHAGEAL MANOMETRY (EM);  Surgeon: Inda Castle, MD;  Location: [facility];  Service: Endoscopy;  Laterality: N/A;  . Hiatal hernia repair N/A 04/24/2013    Procedure: LAPAROSCOPIC  REPAIR RECURRENT PARASOPHAGEAL HIATAL HERNIA WITH FUNDOPLICATION;  Surgeon: Adin Hector, MD;  Location: [facility];  Service: General;  Laterality: N/A;  . Insertion of mesh N/A 04/24/2013    Procedure: INSERTION OF MESH;  Surgeon: Adin Hector, MD;  Location: [facility];  Service: General;  Laterality: N/A;  . Laparoscopic lysis of  adhesions N/A 04/24/2013    Procedure: LAPAROSCOPIC LYSIS OF ADHESIONS;  Surgeon: Adin Hector, MD;  Location: [facility];  Service: General;  Laterality: N/A;    History   Social History  . Marital Status: Married    Spouse Name: N/A    Number of Children: N/A  . Years of Education: N/A   Occupational History  . Retired     worked at [employer]  . Smoking status: Never Smoker   . Smokeless tobacco: Never Used  . Alcohol Use: No  . Drug Use: No  . Sexual Activity: No   Other Topics Concern  . Not on file   Social History Narrative   Lives with husband.    Family History  Problem Relation Age of Onset  . Cancer Mother     colon   . Colon cancer Mother 84  . Heart disease Father 45    heart attack    Current Outpatient Prescriptions  Medication Sig Dispense  Refill  . Aclidinium Bromide (TUDORZA PRESSAIR) 400 MCG/ACT AEPB Inhale 1 puff into the lungs 2 (two) times daily.      [name] ALPRAZolam (XANAX) 0.5 MG tablet Take 0.5 mg by mouth 3 (three) times daily as needed for sleep or anxiety.       [name] amLODipine (NORVASC) 10 MG tablet Take 10 mg by mouth every morning.       . calcitonin, salmon, (MIACALCIN/FORTICAL) 200 UNIT/ACT nasal spray Place 1 spray into the nose every morning. She alternates nostrils every other day.      . Cholecalciferol (VITAMIN D-3) 1000 UNITS CAPS Take 1 capsule by mouth 2 (two) times daily. Take with 2,000 unit to = 3,000 units/each dose      . Cholecalciferol (VITAMIN D3) 2000 UNITS TABS Take 1 tablet by mouth 2 (two) times daily. Take with 1,000 unit to = 3,000 units/dose      . Cyanocobalamin (VITAMIN B 12 PO) Take 1 tablet by mouth every morning.       . docusate sodium 100 MG CAPS Take 100 mg by mouth 2 (two) times daily.  10 capsule  0  . losartan (COZAAR) 100 MG tablet Take 100 mg by mouth every morning.       . metoCLOPramide (REGLAN) 10 MG tablet Take 1 tablet (10 mg total) by mouth every 6 (six) hours as  needed (Nausea).  20 tablet  0  . metoCLOPramide (REGLAN) 5 MG tablet Take 1 tablet (5 mg total) by mouth 4 (four) times daily.  30 tablet  2  . metoprolol tartrate (LOPRESSOR) 25 MG tablet Take 12.5 mg by mouth at bedtime.      . mometasone-formoterol (DULERA) 200-5 MCG/ACT AERO Inhale 2 puffs into the lungs 2 (two) times daily.      . Multiple Vitamin (MULTIVITAMIN WITH MINERALS) TABS Take 1 tablet by mouth every morning.       . ondansetron (ZOFRAN ODT) 4 MG disintegrating tablet Take 1 tablet (4 mg total) by mouth every 8 (eight) hours as needed for nausea or vomiting.  8 tablet  5  . ondansetron (ZOFRAN) 4 MG tablet Take 1 tablet (4 mg total) by mouth every 8 (eight) hours as needed for nausea or vomiting.  36 tablet  5  . OVER THE COUNTER MEDICATION Take 1,000 mg by mouth every other day. [place]al Calcium 1000mg       . OVER THE COUNTER MEDICATION Take 400 mg by mouth 3 (three) times daily. Magnesium Carbonate 400mg       . pantoprazole (PROTONIX) 40 MG tablet Take 40 mg by mouth 2 (two) times daily.      . potassium chloride (K-DUR) 10 MEQ tablet Take 1 tablet (10 mEq total) by mouth 2 (two) times daily.  60 tablet  1  . pravastatin (PRAVACHOL) 40 MG tablet Take 40 mg by mouth at bedtime.       . senna (SENOKOT) 8.6 MG TABS tablet Take 1 tablet (8.6 mg total) by mouth daily.  120 each  0  . traMADol (ULTRAM) 50 MG tablet Take 1-2 tablets (50-100 mg total) by mouth every 6 (six) hours as needed for moderate pain or severe pain.  40 tablet  0   No current facility-administered medications for this visit.     Allergies  Allergen Reactions  . Aspartame Diarrhea and Nausea And Vomiting  . Daliresp [Roflumilast] Nausea And Vomiting  . Hydrocodone Nausea Only  . Oxycodone Nausea Only  . Phenergan [Promethazine Hcl] Other (See Comments)    "  Doesn't work"  . Ace Inhibitors Cough    Per Dr Sherene Sires pulmonology 2013    BP 146/96  Pulse 72  Temp(Src) 97.8 F (36.6 C) (Temporal)  Resp 18  Ht 5'  5" (1.651 m)  Wt 137 lb (62.143 kg)  BMI 22.80 kg/m2  Ct Chest W Contrast  05/13/2013   CLINICAL DATA:  Nausea and abdominal pain. Hiatal hernia repair 3 weeks prior. Rule out abscess.  EXAM: CT CHEST, ABDOMEN, AND PELVIS WITH CONTRAST  TECHNIQUE: Multidetector CT imaging of the chest, abdomen and pelvis was performed following the standard protocol during bolus administration of intravenous contrast.  CONTRAST:  OMNIPAQUE IOHEXOL 300 MG/ML  SOLN  COMPARISON:  Chest CT 03/12/2013.  FINDINGS: CT CHEST FINDINGS  THORACIC INLET/BODY WALL:  No acute abnormality.  MEDIASTINUM:  Normal heart size. No pericardial effusion. Diffuse atherosclerosis, including the coronary arteries. There is suggestion of subendocardial fibro fatty scarring along the LAD distribution, compatible with remote infarct. No evidence of acute vascular abnormality.  Interval repair of recurrent hiatal hernia. There is a tight Nissen wrap with extra luminal high-density material consistent with pledgets (rather than extra luminal contrast). The distal esophagus and gastric wrap is thickened. There is fluid accumulating around the posterior and right lateral distal esophagus measuring approximately 2 x 1 x 1.5 cm. No well-defined enhancing wall or surrounding fat infiltration.  LUNG WINDOWS:  No consolidation.  No effusion.  No suspicious pulmonary nodule.  OSSEOUS:  No acute fracture.  No suspicious lytic or blastic lesions.  CT ABDOMEN AND PELVIS FINDINGS  BODY WALL: Pelvic floor laxity with rectal prolapse.  Liver: Atrophy of the lateral segment left lobe liver, with intrahepatic biliary ductal dilatation.  Biliary: Cholecystectomy.  Chronic, diffuse biliary ductal dilation.  Pancreas: Unremarkable.  Spleen: No focal abnormality.  Adrenals: Unremarkable.  Kidneys and ureters: No hydronephrosis or stone. Tiny presumed cyst in the interpolar left kidney.  Bladder: Unremarkable.  Reproductive: Hysterectomy.  Bowel: No obstruction.   Retroperitoneum: No mass or adenopathy.  Peritoneum: No free fluid or gas.  Vascular: No acute abnormality.  OSSEOUS: Unchanged linear sclerosis in the left more than right sacral ala, consistent with previous insufficiency fractures. Remote L1 and L3 compression fractures with unchanged height loss greater than 50%. Remote 12 superior endplate fracture. L4-5 anterolisthesis related to advanced facet osteoarthritis. No acute fracture seen  IMPRESSION: 1. Distal esophageal thickening/inflammation status post recent hiatal hernia repair with Nissen fundoplication. There is a 2 x 1 x 1.5 cm fluid collection along the lower right esophagus which is likely incidental/postoperative, but is sterility indeterminate by imaging. No evidence of esophageal leak. 2. Chronic intra-abdominal and intrathoracic findings are stable from recent CT comparisons, described above.   Electronically Signed   By: Tiburcio Pea M.D.   On: 05/13/2013 07:13   Ct Abdomen Pelvis W Contrast  05/13/2013   CLINICAL DATA:  Nausea and abdominal pain. Hiatal hernia repair 3 weeks prior. Rule out abscess.  EXAM: CT CHEST, ABDOMEN, AND PELVIS WITH CONTRAST  TECHNIQUE: Multidetector CT imaging of the chest, abdomen and pelvis was performed following the standard protocol during bolus administration of intravenous contrast.  CONTRAST:  OMNIPAQUE IOHEXOL 300 MG/ML  SOLN  COMPARISON:  Chest CT 03/12/2013.  FINDINGS: CT CHEST FINDINGS  THORACIC INLET/BODY WALL:  No acute abnormality.  MEDIASTINUM:  Normal heart size. No pericardial effusion. Diffuse atherosclerosis, including the coronary arteries. There is suggestion of subendocardial fibro fatty scarring along the LAD distribution, compatible with remote infarct. No  evidence of acute vascular abnormality.  Interval repair of recurrent hiatal hernia. There is a tight Nissen wrap with extra luminal high-density material consistent with pledgets (rather than extra luminal contrast). The distal  esophagus and gastric wrap is thickened. There is fluid accumulating around the posterior and right lateral distal esophagus measuring approximately 2 x 1 x 1.5 cm. No well-defined enhancing wall or surrounding fat infiltration.  LUNG WINDOWS:  No consolidation.  No effusion.  No suspicious pulmonary nodule.  OSSEOUS:  No acute fracture.  No suspicious lytic or blastic lesions.  CT ABDOMEN AND PELVIS FINDINGS  BODY WALL: Pelvic floor laxity with rectal prolapse.  Liver: Atrophy of the lateral segment left lobe liver, with intrahepatic biliary ductal dilatation.  Biliary: Cholecystectomy.  Chronic, diffuse biliary ductal dilation.  Pancreas: Unremarkable.  Spleen: No focal abnormality.  Adrenals: Unremarkable.  Kidneys and ureters: No hydronephrosis or stone. Tiny presumed cyst in the interpolar left kidney.  Bladder: Unremarkable.  Reproductive: Hysterectomy.  Bowel: No obstruction.  Retroperitoneum: No mass or adenopathy.  Peritoneum: No free fluid or gas.  Vascular: No acute abnormality.  OSSEOUS: Unchanged linear sclerosis in the left more than right sacral ala, consistent with previous insufficiency fractures. Remote L1 and L3 compression fractures with unchanged height loss greater than 50%. Remote 12 superior endplate fracture. L4-5 anterolisthesis related to advanced facet osteoarthritis. No acute fracture seen  IMPRESSION: 1. Distal esophageal thickening/inflammation status post recent hiatal hernia repair with Nissen fundoplication. There is a 2 x 1 x 1.5 cm fluid collection along the lower right esophagus which is likely incidental/postoperative, but is sterility indeterminate by imaging. No evidence of esophageal leak. 2. Chronic intra-abdominal and intrathoracic findings are stable from recent CT comparisons, described above.   Electronically Signed   By: Jorje Guild M.D.   On: 05/13/2013 07:13   Dg Abd Acute W/chest  05/13/2013   CLINICAL DATA:  Right lower quadrant pain, recent hiatal hernia  fundoplication.  EXAM: ACUTE ABDOMEN SERIES (ABDOMEN 2 VIEW & CHEST 1 VIEW)  COMPARISON:  Acute abdominal series April 22, 2013  FINDINGS: Cardiomediastinal silhouette is nonsuspicious, unchanged. Mild chronic interstitial changes with scarring in left lung base. No pleural effusions or focal consolidations. No pneumothorax.  Bowel gas pattern is nondilated and nonobstructive. Surgical clips in the right abdomen likely reflect cholecystectomy. Surgical clips at the gastroesophageal junction. No intra-abdominal mass effect or pathologic calcifications. No free air. Phleboliths in the pelvis. Patient is osteopenic. Mild levoscoliosis of the lumbar spine is unchanged. Mild L1 compression fracture may be new, chronic L3 moderate to severe compression deformity.  IMPRESSION: No acute cardiopulmonary process.  Nonspecific bowel gas pattern.  Osteopenia, chronic L3 moderate compression fracture with new appearing L1 compression fracture, recommend correlation with point tenderness.   Electronically Signed   By: Elon Alas   On: 05/13/2013 02:03   Dg Esophagus W/water Sol Cm  05/14/2013   CLINICAL DATA:  Post redo fundoplication.  EXAM: ESOPHOGRAM/BARIUM SWALLOW  TECHNIQUE: Single contrast examination was performed using  water soluble.  COMPARISON:  05/13/2013 CT.  FLUOROSCOPY TIME:  2 min and 5 seconds  FINDINGS: Radiopaque material at the gastroesophageal junction consistent with material utilized for procedure in addition to surgical clips.  Circumferential narrowing at the reconstructed gastroesophageal junction without leak identified.  There is slow transit time through this region which may be partially explained by postoperative edema in addition to the operative procedure.  With the patient in an upright position, ingested contrast remains within the distal esophagus  above this region of circumferential narrowing but subsequently clears with dry swallowing.  Remote compression fracture L1 and L3  vertebral body incompletely assessed.  IMPRESSION: Radiopaque material at the gastroesophageal junction consistent with material utilized for procedure in addition to surgical clips.  Circumferential narrowing at the reconstructed gastroesophageal junction without leak identified.  There is slow transit time through this region which may be partially explained by postoperative edema in addition to the operative procedure.  With the patient in an upright position, ingested contrast remains within the distal esophagus above this region of circumferential narrowing but subsequently clears with dry swallowing.  This is a call report.   Electronically Signed   By: Chauncey Cruel M.D.   On: 05/14/2013 10:36     Review of Systems  Constitutional: Negative for fever, chills and diaphoresis.  HENT: Negative for ear pain, sore throat and trouble swallowing.   Eyes: Negative for photophobia and visual disturbance.  Respiratory: Negative for cough, choking and shortness of breath.   Cardiovascular: Negative for chest pain and palpitations.  Gastrointestinal: Positive for constipation. Negative for nausea, vomiting, abdominal pain, diarrhea, blood in stool, abdominal distention, anal bleeding and rectal pain.  Genitourinary: Negative for dysuria, frequency, decreased urine volume and difficulty urinating.  Musculoskeletal: Positive for back pain and myalgias. Negative for gait problem.  Skin: Negative for color change, pallor and rash.  Neurological: Negative for dizziness, speech difficulty, weakness and numbness.  Hematological: Negative for adenopathy.  Psychiatric/Behavioral: Negative for confusion and agitation. The patient is nervous/anxious.        Objective:   Physical Exam  Constitutional: She is oriented to person, place, and time. She appears well-developed and well-nourished. No distress.  HENT:  Head: Normocephalic.  Mouth/Throat: Oropharynx is clear and moist. No oropharyngeal exudate.    Eyes: Conjunctivae and EOM are normal. Pupils are equal, round, and reactive to light. No scleral icterus.  Neck: Normal range of motion. No tracheal deviation present.  Cardiovascular: Normal rate and intact distal pulses.   Pulmonary/Chest: Effort normal. No respiratory distress. She exhibits no tenderness.  Abdominal: Soft. She exhibits no distension. There is no tenderness. Hernia confirmed negative in the right inguinal area and confirmed negative in the left inguinal area.  Incisions clean with normal healing ridges.  No hernias  Genitourinary: No vaginal discharge found.  Musculoskeletal: Normal range of motion.       Thoracic back: Normal. She exhibits no tenderness, no bony tenderness, no pain and no spasm.       Lumbar back: She exhibits pain and spasm. She exhibits no deformity and no laceration.       Back:  Lymphadenopathy:       Right: No inguinal adenopathy present.       Left: No inguinal adenopathy present.  Neurological: She is alert and oriented to person, place, and time. She displays tremor. No cranial nerve deficit. She exhibits normal muscle tone. She displays no seizure activity. Coordination normal.  Mild left hand tremor  Skin: Skin is warm and dry. No rash noted. She is not diaphoretic.  Psychiatric: Her behavior is normal. Judgment and thought content normal. Her mood appears anxious. Her speech is not rapid and/or pressured and not delayed. She is not agitated and not aggressive. Cognition and memory are normal.  Consolable.  Nearly tearful at the beginning of visit.  Smiling at the end of the visit.       Assessment:     Recovering relatively well from repair of recurrent paraesophageal  hernia.  Worsening chronic lumbar low back pain.     Plan:     Increase activity as tolerated to regular activity.  Low impact exercise such as walking an hour a day at least ideal.  Do not push through pain.  Eventually would like to get her off narcotics, but I do not  think that is feasible with her severe lumbar back pain.  Terminal seems to be the only narcotic she can tolerate.  Defer to Dr. Louanne Skye on that  Diet as tolerated.  Continue to advanced diet consistency per post esophageal surgery handout/recommendations.  I strongly recommend that she take a fiber supplement at least twice a day.  Parallax.  Try not to rely on laxatives as that can make things more regular.Low fat high fiber diet ideal.  Bowel regimen with 30 g fiber a day and fiber supplement as needed to avoid problems.  Return to clinic 3 weeks, sooner as needed.   Instructions discussed.  Followup with primary care physician for other health issues as would normally be done.  Consider screening for malignancies (breast, prostate, colon, melanoma, etc) as appropriate.  Patient had many questions.   Questions answered.  By the end of the visit, she was smiling and feeling better.  The patient & husband expressed understanding and appreciation

## 2013-06-06 ENCOUNTER — Telehealth: Payer: Self-pay

## 2013-06-06 NOTE — Telephone Encounter (Signed)
Pt called requesting a MRI to be rescheduled. Dr.Nitka ordered MRI initially, per McK, pt needs to contact Dr.Nitka to sched MRI. Pt aware.

## 2013-06-13 ENCOUNTER — Ambulatory Visit (INDEPENDENT_AMBULATORY_CARE_PROVIDER_SITE_OTHER): Payer: Medicare HMO | Admitting: Emergency Medicine

## 2013-06-13 ENCOUNTER — Telehealth (INDEPENDENT_AMBULATORY_CARE_PROVIDER_SITE_OTHER): Payer: Self-pay

## 2013-06-13 ENCOUNTER — Encounter: Payer: Self-pay | Admitting: Emergency Medicine

## 2013-06-13 VITALS — BP 138/70 | HR 68 | Temp 98.2°F | Resp 16 | Wt 131.0 lb

## 2013-06-13 DIAGNOSIS — R5383 Other fatigue: Secondary | ICD-10-CM

## 2013-06-13 DIAGNOSIS — R110 Nausea: Secondary | ICD-10-CM

## 2013-06-13 DIAGNOSIS — R350 Frequency of micturition: Secondary | ICD-10-CM

## 2013-06-13 DIAGNOSIS — R5381 Other malaise: Secondary | ICD-10-CM

## 2013-06-13 LAB — CBC WITH DIFFERENTIAL/PLATELET
Basophils Absolute: 0 10*3/uL (ref 0.0–0.1)
Basophils Relative: 1 % (ref 0–1)
Eosinophils Absolute: 0.2 10*3/uL (ref 0.0–0.7)
Eosinophils Relative: 4 % (ref 0–5)
HCT: 40.2 % (ref 36.0–46.0)
HEMOGLOBIN: 13.5 g/dL (ref 12.0–15.0)
LYMPHS ABS: 2.2 10*3/uL (ref 0.7–4.0)
Lymphocytes Relative: 38 % (ref 12–46)
MCH: 30.8 pg (ref 26.0–34.0)
MCHC: 33.6 g/dL (ref 30.0–36.0)
MCV: 91.6 fL (ref 78.0–100.0)
Monocytes Absolute: 0.6 10*3/uL (ref 0.1–1.0)
Monocytes Relative: 11 % (ref 3–12)
NEUTROS ABS: 2.7 10*3/uL (ref 1.7–7.7)
NEUTROS PCT: 46 % (ref 43–77)
PLATELETS: 480 10*3/uL — AB (ref 150–400)
RBC: 4.39 MIL/uL (ref 3.87–5.11)
RDW: 16.3 % — ABNORMAL HIGH (ref 11.5–15.5)
WBC: 5.8 10*3/uL (ref 4.0–10.5)

## 2013-06-13 LAB — BASIC METABOLIC PANEL WITH GFR
BUN: 12 mg/dL (ref 6–23)
CALCIUM: 10.3 mg/dL (ref 8.4–10.5)
CO2: 28 mEq/L (ref 19–32)
Chloride: 101 mEq/L (ref 96–112)
Creat: 0.69 mg/dL (ref 0.50–1.10)
GFR, EST NON AFRICAN AMERICAN: 85 mL/min
Glucose, Bld: 92 mg/dL (ref 70–99)
POTASSIUM: 4.3 meq/L (ref 3.5–5.3)
Sodium: 144 mEq/L (ref 135–145)

## 2013-06-13 LAB — HEPATIC FUNCTION PANEL
ALBUMIN: 4.5 g/dL (ref 3.5–5.2)
ALT: 13 U/L (ref 0–35)
AST: 14 U/L (ref 0–37)
Alkaline Phosphatase: 87 U/L (ref 39–117)
BILIRUBIN TOTAL: 0.6 mg/dL (ref 0.2–1.2)
Bilirubin, Direct: 0.1 mg/dL (ref 0.0–0.3)
Indirect Bilirubin: 0.5 mg/dL (ref 0.2–1.2)
Total Protein: 6.8 g/dL (ref 6.0–8.3)

## 2013-06-13 MED ORDER — CIPROFLOXACIN HCL 500 MG PO TABS: 500.0000 mg | ORAL_TABLET | Freq: Two times a day (BID) | ORAL | Status: AC

## 2013-06-13 NOTE — Progress Notes (Signed)
Subjective:    Patient ID: [name] [name], female    DOB: Sep 26, 1935, 78 y.o.   MRN: [id number]  HPI Comments: 78 yo female recently released from rehospitalization for hiatal hernia repair complications. She is down almost 40 #. She feels nauseated and has decreased appetite. She has also noticed increased fatigue/ urine frequency with hesitation and Dysuria. She feels she is mildly confused. She is trying to take Zofran AD but is in a lot pain. She takes Tramadol and it helps. She notes decrease BM with decrease intake and stop Miralax. She was having diarrhea with Miralax.  Current Outpatient Prescriptions on File Prior to Visit  Medication Sig Dispense Refill  . ALPRAZolam (XANAX) 0.5 MG tablet Take 0.5 mg by mouth 3 (three) times daily as needed for sleep or anxiety.       [name] amLODipine (NORVASC) 10 MG tablet Take 10 mg by mouth every morning.       . calcitonin, salmon, (MIACALCIN/FORTICAL) 200 UNIT/ACT nasal spray Place 1 spray into the nose every morning. She alternates nostrils every other day.      . Cholecalciferol (VITAMIN D3) 2000 UNITS TABS Take 6,000 Units by mouth daily.       . Cyanocobalamin (VITAMIN B 12 PO) Take 1 tablet by mouth every morning.       [name] losartan (COZAAR) 100 MG tablet Take 50 mg by mouth at bedtime.       . metoCLOPramide (REGLAN) 5 MG tablet Take 1 tablet (5 mg total) by mouth 4 (four) times daily.  30 tablet  2  . metoprolol tartrate (LOPRESSOR) 25 MG tablet Take 12.5 mg by mouth at bedtime.      . mometasone-formoterol (DULERA) 200-5 MCG/ACT AERO Inhale 2 puffs into the lungs 2 (two) times daily.      . Multiple Vitamin (MULTIVITAMIN WITH MINERALS) TABS Take 1 tablet by mouth every morning.       . ondansetron (ZOFRAN) 4 MG tablet Take 1 tablet (4 mg total) by mouth every 8 (eight) hours as needed for nausea or vomiting.  36 tablet  5  . pantoprazole (PROTONIX) 40 MG tablet Take 40 mg by mouth 2 (two) times daily.      . pravastatin (PRAVACHOL) 40 MG tablet  Take 40 mg by mouth at bedtime.       . senna (SENOKOT) 8.6 MG TABS tablet Take 1 tablet (8.6 mg total) by mouth daily.  120 each  0  . traMADol (ULTRAM) 50 MG tablet Take 1-2 tablets (50-100 mg total) by mouth every 6 (six) hours as needed for moderate pain or severe pain.  40 tablet  0  . Aclidinium Bromide (TUDORZA PRESSAIR) 400 MCG/ACT AEPB Inhale 1 puff into the lungs 2 (two) times daily.      [name] docusate sodium 100 MG CAPS Take 100 mg by mouth 2 (two) times daily.  10 capsule  0  . metoCLOPramide (REGLAN) 10 MG tablet Take 1 tablet (10 mg total) by mouth every 6 (six) hours as needed (Nausea).  20 tablet  0  . ondansetron (ZOFRAN ODT) 4 MG disintegrating tablet Take 1 tablet (4 mg total) by mouth every 8 (eight) hours as needed for nausea or vomiting.  8 tablet  5  . OVER THE COUNTER MEDICATION Take 1,000 mg by mouth every other day. Coral Calcium 1000mg       . OVER THE COUNTER MEDICATION Take 400 mg by mouth 3 (three) times daily. Magnesium Carbonate 400mg       .  potassium chloride (K-DUR) 10 MEQ tablet Take 1 tablet (10 mEq total) by mouth 2 (two) times daily.  60 tablet  1   No current facility-administered medications on file prior to visit.   ALLERGIES Allergies  Allergen Reactions  . Aspartame Diarrhea and Nausea And Vomiting  . Daliresp [Roflumilast] Nausea And Vomiting  . Hydrocodone Nausea Only  . Oxycodone Nausea Only  . Phenergan [Promethazine Hcl] Other (See Comments)    "Doesn't work"  . Ace Inhibitors Cough    Per Dr Melvyn Novas pulmonology 2013   Past Medical History  Diagnosis Date  . CAD (coronary artery disease)     LHC 2003 with 40% pLAD, 30% mLAD, 100% D1 (moderate vessel), 100%  D2 (small vessel), 95% mid small OM2.  Pt had PTCA to D1;  D2 and OM2 small vessels and tx medically;  Last Myoview (2012): anterior infarct seen with scar, no ischemia. EF normal. Pt managed medically;  Lexiscan Myoview (12/14):  Low risk; ant scar with very mild peri-infarct ischemia; EF 55%  with ant AK   . Recurrent aspiration bronchitis/pneumonia     PFTs 12/09 FVC 97%, FEV1 101%, ratio 77%, TLC 109%. No significant obstruction or restriction.   . Hypertension   . Hyperlipidemia   . Esophageal stricture   . Diverticulosis of colon   . Gastroesophageal reflux disease with hiatal hernia   . Iron deficiency anemia   . Gastritis   . GERD (gastroesophageal reflux disease)   . Hiatal hernia   . Asthma   . Blood transfusion   . Cough   . Wheezing   . Weakness   . Trouble swallowing   . Abdominal pain   . Hiatal hernia   . Shortness of breath   . Anxiety   . Complication of anesthesia   . PONV (postoperative nausea and vomiting)   . TIA (transient ischemic attack)   . BRONCHITIS, ACUTE 07/25/2007         . DIVERTICULOSIS, COLON 08/28/2008    Qualifier: Diagnosis of  By: [name], [name], [name]    . IRON DEFICIENCY ANEMIA, HX OF 07/24/2007    Qualifier: Diagnosis of  By: Tilden Dome        Review of Systems  Constitutional: Positive for fatigue.  Gastrointestinal: Positive for nausea and diarrhea.  Genitourinary: Positive for dysuria and frequency.  Psychiatric/Behavioral: Positive for confusion.  All other systems reviewed and are negative.   BP 138/70  Pulse 68  Temp(Src) 98.2 F (36.8 C) (Temporal)  Resp 16  Wt 131 lb (59.421 kg)     Objective:   Physical Exam  Nursing note and vitals reviewed. Constitutional: She is oriented to person, place, and time. She appears well-developed. No distress.  Appears more thin and almost weak  HENT:  Head: Normocephalic and atraumatic.  Right Ear: External ear normal.  Left Ear: External ear normal.  Nose: Nose normal.  Mouth/Throat: Oropharynx is clear and moist. No oropharyngeal exudate.  Eyes: Conjunctivae and EOM are normal.  Neck: Normal range of motion. Neck supple. No JVD present. No thyromegaly present.  Cardiovascular: Normal rate, regular rhythm, normal heart sounds and intact distal pulses.    Pulmonary/Chest: Effort normal and breath sounds normal.  Abdominal: Soft. Bowel sounds are normal. She exhibits no distension and no mass. There is tenderness. There is no rebound and no guarding.  Minimal epigastric  Musculoskeletal: Normal range of motion. She exhibits no edema and no tenderness.  Lymphadenopathy:    She has no cervical adenopathy.  Neurological: She is alert and oriented to person, place, and time. No cranial nerve deficit.  Slow to answer questions and occasionally corrects herself when give wrong answer  Skin: Skin is warm and dry. No rash noted. No erythema. No pallor.  Psychiatric: She has a normal mood and affect. Her behavior is normal. Judgment and thought content normal.          Assessment & Plan:  1. Fatigue- check labs, increase activity and H2O 2. Nausea with recent hospitalization with Repeat surgery for complications from previous [facility] repair- continue bland diet, small portions, take zofran at least BID to stay ahead of nausea, w/c if SX increase or ER.  3. Urine frequency- check labs, cipro 500 mg AD, push H2O Hygiene explained 4. Diarrhea/ Constipation- Increase fiber/ water intake, decrease caffeine, increase activity level, can add Miralax or Metamucil QoD until BM soft

## 2013-06-13 NOTE — Patient Instructions (Signed)
Urinary Tract Infection A urinary tract infection (UTI) can occur any place along the urinary tract. The tract includes the kidneys, ureters, bladder, and urethra. A type of germ called bacteria often causes a UTI. UTIs are often helped with antibiotic medicine.  HOME CARE   If given, take antibiotics as told by your doctor. Finish them even if you start to feel better.  Drink enough fluids to keep your pee (urine) clear or pale yellow.  Avoid tea, drinks with caffeine, and bubbly (carbonated) drinks.  Pee often. Avoid holding your pee in for a long time.  Pee before and after having sex (intercourse).  Wipe from front to back after you poop (bowel movement) if you are a woman. Use each tissue only once. GET HELP RIGHT AWAY IF:   You have back pain.  You have lower belly (abdominal) pain.  You have chills.  You feel sick to your stomach (nauseous).  You throw up (vomit).  Your burning or discomfort with peeing does not go away.  You have a fever.  Your symptoms are not better in 3 days. MAKE SURE YOU:   Understand these instructions.  Will watch your condition.  Will get help right away if you are not doing well or get worse. Document Released: 10/12/2007 Document Revised: 01/18/2012 Document Reviewed: 11/24/2011 [facility] Patient Information 2014 [place], [place]. Nausea, Adult Nausea means you feel sick to your stomach or need to throw up (vomit). It may be a sign of a more serious problem. If nausea gets worse, you may throw up. If you throw up a lot, you may lose too much body fluid (dehydration). HOME CARE   Get plenty of rest.  Ask your doctor how to replace body fluid losses (rehydrate).  Eat small amounts of food. Sip liquids more often.  Take all medicines as told by your doctor. GET HELP RIGHT AWAY IF:  You have a fever.  You pass out (faint).  You keep throwing up or have blood in your throw up.  You are very weak, have dry lips or a dry mouth, or  you are very thirsty (dehydrated).  You have dark or bloody poop (stool).  You have very bad chest or belly (abdominal) pain.  You do not get better after 2 days, or you get worse.  You have a headache. MAKE SURE YOU:  Understand these instructions.  Will watch your condition.  Will get help right away if you are not doing well or get worse. Document Released: 04/14/2011 Document Revised: 07/18/2011 Document Reviewed: 04/14/2011 [facility] Patient Information 2014 [place], [place].

## 2013-06-13 NOTE — Telephone Encounter (Signed)
Pt called stating she has appt for MRI and appt with another MD for her back pain. Pt wanting to know if Dr Johney [place] would given her a refill on her pain med. I advised pt that she will need to contact the MD treating her back pain for any pain medication related to her back pain. Pt states she understands and will contact his office.

## 2013-06-14 LAB — URINALYSIS, ROUTINE W REFLEX MICROSCOPIC
BILIRUBIN URINE: NEGATIVE
Glucose, UA: NEGATIVE mg/dL
HGB URINE DIPSTICK: NEGATIVE
Ketones, ur: NEGATIVE mg/dL
Leukocytes, UA: NEGATIVE
Nitrite: NEGATIVE
Protein, ur: NEGATIVE mg/dL
Specific Gravity, Urine: 1.018 (ref 1.005–1.030)
UROBILINOGEN UA: 0.2 mg/dL (ref 0.0–1.0)
pH: 7.5 (ref 5.0–8.0)

## 2013-06-14 LAB — URINE CULTURE
COLONY COUNT: NO GROWTH
Organism ID, Bacteria: NO GROWTH

## 2013-06-14 LAB — TSH: TSH: 0.65 u[IU]/mL (ref 0.350–4.500)

## 2013-06-18 ENCOUNTER — Encounter (HOSPITAL_COMMUNITY): Payer: Self-pay | Admitting: Emergency Medicine

## 2013-06-18 ENCOUNTER — Emergency Department (HOSPITAL_COMMUNITY)
Admission: EM | Admit: 2013-06-18 | Discharge: 2013-06-19 | Disposition: A | Discharge status: Home / Self Care | Payer: Medicare HMO | Attending: Emergency Medicine | Admitting: Emergency Medicine

## 2013-06-18 ENCOUNTER — Ambulatory Visit
Admission: RE | Admit: 2013-06-18 | Discharge: 2013-06-18 | Disposition: A | Discharge status: Home / Self Care | Payer: Medicare HMO | Source: Ambulatory Visit | Attending: Specialist | Admitting: Specialist

## 2013-06-18 DIAGNOSIS — Z9861 Coronary angioplasty status: Secondary | ICD-10-CM | POA: Insufficient documentation

## 2013-06-18 DIAGNOSIS — Z862 Personal history of diseases of the blood and blood-forming organs and certain disorders involving the immune mechanism: Secondary | ICD-10-CM | POA: Insufficient documentation

## 2013-06-18 DIAGNOSIS — M545 Low back pain, unspecified: Secondary | ICD-10-CM | POA: Insufficient documentation

## 2013-06-18 DIAGNOSIS — M79604 Pain in right leg: Secondary | ICD-10-CM

## 2013-06-18 DIAGNOSIS — J45909 Unspecified asthma, uncomplicated: Secondary | ICD-10-CM | POA: Insufficient documentation

## 2013-06-18 DIAGNOSIS — G8929 Other chronic pain: Secondary | ICD-10-CM | POA: Insufficient documentation

## 2013-06-18 DIAGNOSIS — Z8673 Personal history of transient ischemic attack (TIA), and cerebral infarction without residual deficits: Secondary | ICD-10-CM | POA: Insufficient documentation

## 2013-06-18 DIAGNOSIS — E785 Hyperlipidemia, unspecified: Secondary | ICD-10-CM | POA: Insufficient documentation

## 2013-06-18 DIAGNOSIS — IMO0002 Reserved for concepts with insufficient information to code with codable children: Secondary | ICD-10-CM | POA: Insufficient documentation

## 2013-06-18 DIAGNOSIS — R52 Pain, unspecified: Secondary | ICD-10-CM | POA: Insufficient documentation

## 2013-06-18 DIAGNOSIS — Z79899 Other long term (current) drug therapy: Secondary | ICD-10-CM | POA: Insufficient documentation

## 2013-06-18 DIAGNOSIS — K219 Gastro-esophageal reflux disease without esophagitis: Secondary | ICD-10-CM | POA: Insufficient documentation

## 2013-06-18 DIAGNOSIS — F411 Generalized anxiety disorder: Secondary | ICD-10-CM | POA: Insufficient documentation

## 2013-06-18 DIAGNOSIS — I2510 Atherosclerotic heart disease of native coronary artery without angina pectoris: Secondary | ICD-10-CM | POA: Insufficient documentation

## 2013-06-18 MED ORDER — ONDANSETRON HCL 4 MG/2ML IJ SOLN
4.0000 mg | Freq: Once | INTRAMUSCULAR | Status: AC
  Administered 2013-06-18: 4 mg via INTRAVENOUS
  Filled 2013-06-18: qty 2

## 2013-06-18 MED ORDER — GADOBENATE DIMEGLUMINE 529 MG/ML IV SOLN
12.0000 mL | Freq: Once | INTRAVENOUS | Status: AC | PRN
  Administered 2013-06-18: 12 mL via INTRAVENOUS

## 2013-06-18 MED ORDER — FENTANYL CITRATE 0.05 MG/ML IJ SOLN
50.0000 ug | Freq: Once | INTRAMUSCULAR | Status: AC
  Administered 2013-06-18: 50 ug via INTRAVENOUS
  Filled 2013-06-18: qty 2

## 2013-06-18 NOTE — ED Notes (Signed)
Pt. reports chronic low back pain worse these past several days , deneis recent fall or injury  , MRI of spine done this morning . Pt. also reported nausea today .

## 2013-06-19 MED ORDER — OXYCODONE-ACETAMINOPHEN 5-325 MG PO TABS: 1.0000 | ORAL_TABLET | Freq: Four times a day (QID) | ORAL | Status: DC | PRN

## 2013-06-19 MED ORDER — ONDANSETRON 4 MG PO TBDP: 4.0000 mg | ORAL_TABLET | Freq: Three times a day (TID) | ORAL | Status: DC | PRN

## 2013-06-19 MED ORDER — PREDNISONE 20 MG PO TABS
ORAL_TABLET | ORAL | Status: DC
Start: 2013-06-19 — End: 2013-06-26

## 2013-06-19 MED ORDER — DIAZEPAM 5 MG/ML IJ SOLN
5.0000 mg | Freq: Once | INTRAMUSCULAR | Status: AC
  Administered 2013-06-19: 5 mg via INTRAVENOUS
  Filled 2013-06-19: qty 2

## 2013-06-19 MED ORDER — HYDROMORPHONE HCL PF 1 MG/ML IJ SOLN
0.5000 mg | Freq: Once | INTRAMUSCULAR | Status: AC
  Administered 2013-06-19: 0.5 mg via INTRAVENOUS
  Filled 2013-06-19: qty 1

## 2013-06-19 MED ORDER — ONDANSETRON HCL 4 MG/2ML IJ SOLN
4.0000 mg | Freq: Once | INTRAMUSCULAR | Status: AC
  Administered 2013-06-19: 4 mg via INTRAVENOUS
  Filled 2013-06-19: qty 2

## 2013-06-19 MED ORDER — ONDANSETRON 4 MG PO TBDP
4.0000 mg | ORAL_TABLET | Freq: Once | ORAL | Status: AC
  Administered 2013-06-19: 4 mg via ORAL
  Filled 2013-06-19: qty 1

## 2013-06-19 MED ORDER — DIAZEPAM 5 MG PO TABS: 5.0000 mg | ORAL_TABLET | Freq: Four times a day (QID) | ORAL | Status: DC | PRN

## 2013-06-19 MED ORDER — METHYLPREDNISOLONE SODIUM SUCC 125 MG IJ SOLR
125.0000 mg | Freq: Once | INTRAMUSCULAR | Status: AC
  Administered 2013-06-19: 125 mg via INTRAVENOUS
  Filled 2013-06-19: qty 2

## 2013-06-19 MED ORDER — FENTANYL CITRATE 0.05 MG/ML IJ SOLN
50.0000 ug | Freq: Once | INTRAMUSCULAR | Status: AC
  Administered 2013-06-19: 50 ug via INTRAVENOUS
  Filled 2013-06-19: qty 2

## 2013-06-19 NOTE — Discharge Instructions (Signed)
U. been given medication for pain, muscle spasm and get her a potent anti-inflammatory medication called, prednisone.  Please take these as directed.  Please make sure to keep your appointment with your orthopedic surgeon on Thursday as scheduled

## 2013-06-19 NOTE — ED Provider Notes (Signed)
CSN: [id number]     Arrival date & time 06/18/13  2145 History   First MD Initiated Contact with Patient 06/19/13 0239     Chief Complaint  Patient presents with  . Back Pain     (Consider location/radiation/quality/duration/timing/severity/associated sxs/prior Treatment) HPI Comments: Patient with a history of chronic lumbar pain.  Has known disc disease, had an MRI yesterday per Dr. Terrence Dupont, which shows some minor changes, and increasing endplate compression of L1 to.  She has an appointment to see Dr. neck cut to arrange for spinal fusion on Thursday.  She presents to the emergency department tonight, with exacerbation of her pain.  She's only been taking Ultram. She denies any loss of bowel or bladder control, nausea, vomiting, diarrhea, constipation, shortness of breath, or chest pain  Patient is a 78 y.o. female presenting with back pain. The history is provided by the patient.  Back Pain Location:  Lumbar spine Quality:  Aching Radiates to:  R posterior upper leg Pain severity:  Severe Pain is:  Same all the time Onset quality:  Gradual Timing:  Constant Progression:  Worsening Chronicity:  Chronic Relieved by:  Nothing Worsened by:  Ambulation Ineffective treatments: Tramadol. Associated symptoms: no dysuria, no fever, no numbness and no weakness     Past Medical History  Diagnosis Date  . CAD (coronary artery disease)     LHC 2003 with 40% pLAD, 30% mLAD, 100% D1 (moderate vessel), 100%  D2 (small vessel), 95% mid small OM2.  Pt had PTCA to D1;  D2 and OM2 small vessels and tx medically;  Last Myoview (2012): anterior infarct seen with scar, no ischemia. EF normal. Pt managed medically;  Lexiscan Myoview (12/14):  Low risk; ant scar with very mild peri-infarct ischemia; EF 55% with ant AK   . Recurrent aspiration bronchitis/pneumonia     PFTs 12/09 FVC 97%, FEV1 101%, ratio 77%, TLC 109%. No significant obstruction or restriction.   . Hypertension   . Hyperlipidemia   .  Esophageal stricture   . Diverticulosis of colon   . Gastroesophageal reflux disease with hiatal hernia   . Iron deficiency anemia   . Gastritis   . GERD (gastroesophageal reflux disease)   . Hiatal hernia   . Asthma   . Blood transfusion   . Cough   . Wheezing   . Weakness   . Trouble swallowing   . Abdominal pain   . Hiatal hernia   . Shortness of breath   . Anxiety   . Complication of anesthesia   . PONV (postoperative nausea and vomiting)   . TIA (transient ischemic attack)   . BRONCHITIS, ACUTE 07/25/2007         . DIVERTICULOSIS, COLON 08/28/2008    Qualifier: Diagnosis of  By: [name], [name], [name]    . IRON DEFICIENCY ANEMIA, HX OF 07/24/2007    Qualifier: Diagnosis of  By: Tilden Dome     Past Surgical History  Procedure Laterality Date  . Back surgery    . Cholecystectomy    . Bladder repair    . Hemorrhoid surgery    . Cholecystectomy      Patient unsure of date.  [name] Appendectomy      patient unsure of date  . Total abdominal hysterectomy  1975    partial  . Coronary angioplasty  11/16/2001  . Eye surgery  11/2000    bilateral cataracts with lens implant  . Hiatal hernia repair  06/03/2011    Procedure: LAPAROSCOPIC REPAIR  OF HIATAL HERNIA;  Surgeon: Adin Hector, MD;  Location: [facility];  Service: General;  Laterality: N/A;  . Laparoscopic nissen fundoplication  7/56/4332    Procedure: LAPAROSCOPIC NISSEN FUNDOPLICATION;  Surgeon: Adin Hector, MD;  Location: [facility];  Service: General;  Laterality: N/A;  . Lumbar laminectomy/decompression microdiscectomy Right 06/26/2012    Procedure: LUMBAR LAMINECTOMY/DECOMPRESSION MICRODISCECTOMY;  Surgeon: Jessy Oto, MD;  Location: [place];  Service: Orthopedics;  Laterality: Right;  RIGHT L4-5 MICRODISCECTOMY  . Esophagogastroduodenoscopy N/A 03/15/2013    Procedure: ESOPHAGOGASTRODUODENOSCOPY (EGD);  Surgeon: Jerene Bears, MD;  Location: [facility] END[facility]COPY;  Service: Endoscopy;  Laterality: N/A;  . Esophageal manometry  N/A 03/25/2013    Procedure: ESOPHAGEAL MANOMETRY (EM);  Surgeon: Inda Castle, MD;  Location: [facility];  Service: Endoscopy;  Laterality: N/A;  . Hiatal hernia repair N/A 04/24/2013    Procedure: LAPAROSCOPIC  REPAIR RECURRENT PARASOPHAGEAL HIATAL HERNIA WITH FUNDOPLICATION;  Surgeon: Adin Hector, MD;  Location: [facility];  Service: General;  Laterality: N/A;  . Insertion of mesh N/A 04/24/2013    Procedure: INSERTION OF MESH;  Surgeon: Adin Hector, MD;  Location: [facility];  Service: General;  Laterality: N/A;  . Laparoscopic lysis of adhesions N/A 04/24/2013    Procedure: LAPAROSCOPIC LYSIS OF ADHESIONS;  Surgeon: Adin Hector, MD;  Location: [facility];  Service: General;  Laterality: N/A;   Family History  Problem Relation Age of Onset  . Cancer Mother     colon   . Colon cancer Mother 15  . Heart disease Father 13    heart attack   History  Substance Use Topics  . Smoking status: Never Smoker   . Smokeless tobacco: Never Used  . Alcohol Use: No   OB History   Grav Para Term Preterm Abortions TAB SAB Ect Mult Living                 Review of Systems  Constitutional: Negative for fever.  Respiratory: Negative for cough and shortness of breath.   Gastrointestinal: Negative for nausea, vomiting, diarrhea and constipation.  Genitourinary: Negative for dysuria, frequency, enuresis and difficulty urinating.  Musculoskeletal: Positive for back pain. Negative for neck pain.  Skin: Negative for rash and wound.  Neurological: Negative for dizziness, weakness and numbness.  All other systems reviewed and are negative.      Allergies  Aspartame; Daliresp; Hydrocodone; Oxycodone; Phenergan; and Ace inhibitors  Home Medications   Current Outpatient Rx  Name  Route  Sig  Dispense  Refill  . Aclidinium Bromide (TUDORZA PRESSAIR) 400 MCG/ACT AEPB   Inhalation   Inhale 1 puff into the lungs 2 (two) times daily.         [name] ALPRAZolam (XANAX) 0.5 MG tablet   Oral    Take 0.5 mg by mouth 3 (three) times daily as needed for sleep or anxiety.          [name] amLODipine (NORVASC) 10 MG tablet   Oral   Take 10 mg by mouth every morning.          . calcitonin, salmon, (MIACALCIN/FORTICAL) 200 UNIT/ACT nasal spray   Nasal   Place 1 spray into the nose every morning. She alternates nostrils every other day.         . Cholecalciferol (VITAMIN D3) 2000 UNITS TABS   Oral   Take 6,000 Units by mouth daily.          . Cyanocobalamin (VITAMIN B 12 PO)  Oral   Take 1 tablet by mouth every morning.          . docusate sodium 100 MG CAPS   Oral   Take 100 mg by mouth 2 (two) times daily.   10 capsule   0   . losartan (COZAAR) 100 MG tablet   Oral   Take 50 mg by mouth daily.          . mometasone-formoterol (DULERA) 200-5 MCG/ACT AERO   Inhalation   Inhale 2 puffs into the lungs 2 (two) times daily.         . Multiple Vitamin (MULTIVITAMIN WITH MINERALS) TABS   Oral   Take 1 tablet by mouth every morning.          . ondansetron (ZOFRAN ODT) 4 MG disintegrating tablet   Oral   Take 1 tablet (4 mg total) by mouth every 8 (eight) hours as needed for nausea or vomiting.   8 tablet   5   . OVER THE COUNTER MEDICATION   Oral   Take 1,000 mg by mouth every other day. Coral Calcium 1000mg          . OVER THE COUNTER MEDICATION   Oral   Take 400 mg by mouth 3 (three) times daily. Magnesium Carbonate 400mg          . pantoprazole (PROTONIX) 40 MG tablet   Oral   Take 40 mg by mouth 2 (two) times daily.         . potassium chloride (K-DUR) 10 MEQ tablet   Oral   Take 1 tablet (10 mEq total) by mouth 2 (two) times daily.   60 tablet   1   . pravastatin (PRAVACHOL) 40 MG tablet   Oral   Take 40 mg by mouth at bedtime.          . senna (SENOKOT) 8.6 MG TABS tablet   Oral   Take 1 tablet (8.6 mg total) by mouth daily.   120 each   0   . traMADol (ULTRAM) 50 MG tablet   Oral   Take 1-2 tablets (50-100 mg total) by mouth  every 6 (six) hours as needed for moderate pain or severe pain.   40 tablet   0   . metoCLOPramide (REGLAN) 10 MG tablet   Oral   Take 1 tablet (10 mg total) by mouth every 6 (six) hours as needed (Nausea).   20 tablet   0   . metoCLOPramide (REGLAN) 5 MG tablet   Oral   Take 1 tablet (5 mg total) by mouth 4 (four) times daily.   30 tablet   2   . metoprolol tartrate (LOPRESSOR) 25 MG tablet   Oral   Take 12.5 mg by mouth at bedtime.          BP 126/74  Pulse 84  Temp(Src) 97.7 F (36.5 C) (Oral)  Resp 18  Wt 132 lb (59.875 kg)  SpO2 92% Physical Exam  Nursing note and vitals reviewed. Constitutional: She is oriented to person, place, and time. She appears well-developed and well-nourished.  HENT:  Head: Normocephalic.  Eyes: Pupils are equal, round, and reactive to light.  Neck: Normal range of motion.  Cardiovascular: Normal rate and regular rhythm.   Pulmonary/Chest: Effort normal and breath sounds normal. No respiratory distress.  Abdominal: Soft. Bowel sounds are normal. She exhibits no distension. There is no tenderness.  Musculoskeletal: Normal range of motion. She exhibits tenderness. She exhibits no edema.  Back:  Neurological: She is alert and oriented to person, place, and time.  Skin: Skin is warm and dry. No rash noted. No erythema.    ED Course  Procedures (including critical care time) Labs Review Labs Reviewed - No data to display Imaging Review Mr Lumbar Spine W Wo Contrast  06/18/2013   CLINICAL DATA:  Severe low back and right leg pain since falling 7 months ago. History of lumbar spine fractures.  EXAM: MRI LUMBAR SPINE WITHOUT AND WITH CONTRAST  TECHNIQUE: Multiplanar and multiecho pulse sequences of the lumbar spine were obtained without and with intravenous contrast.  CONTRAST:  79mL MULTIHANCE GADOBENATE DIMEGLUMINE 529 MG/ML IV SOLN  COMPARISON:  CT ABD/PELVIS W CM dated 05/13/2013; MR L SPINE W/O dated 06/20/2012; MR LUMBAR SPINE WO/W  CM dated 12/11/2012  FINDINGS: Five lumbar type vertebral bodies are assumed. The alignment is stable with a grade 1 anterolisthesis at L4-5 secondary to facet disease. There are generally stable healed fractures at L1 and L3 with mild chronic associated osseous retropulsion. The L3 fracture has progressed since the prior MRI, but is unchanged from the more recent CT ; it does demonstrate mild residual edema and enhancement. There is a new mild superior endplate compression deformity at L4 resulting in approximately 10% loss of vertebral body height. There is associated edema and enhancement superiorly in the L4 vertebral body. The posterior vertebral cortex is intact.  The conus medullaris extends to the L1 level and appears normal. There is no abnormal intradural enhancement. There is minimal paraspinal hemorrhage at L4 and no evidence of epidural hematoma.  L1-2: Mild disc bulging. No significant spinal stenosis or nerve root encroachment.  L2-3: Mildly progressive disc bulging and bilateral facet hypertrophy with mild resulting central stenosis. The foramina are sufficiently patent.  L3-4: Stable annular disc bulging, facet and ligamentous hypertrophy contributing to mild central, lateral recess and right foraminal stenosis.  L4-5: Remote laminectomy is noted on the right. There is annular disc bulging with advanced bilateral facet hypertrophy accounting for the anterolisthesis. Mild narrowing of the left lateral recess and both foramina is unchanged.  L5-S1: Stable chronic degenerative disc disease with loss of disc height and posterior osteophytes. There is mild bilateral facet hypertrophy status post bilateral laminectomy. The lateral recesses and foramina appear unchanged.  IMPRESSION: 1. Interval development of mild acute/subacute superior endplate compression deformity at L4. 2. Healed L1 and L3 fractures.  No other acute osseous findings. 3. Stable spondylosis with a degenerative anterolisthesis at L4-5  contributing to mild residual narrowing of the left lateral recess and both foramina. 4. Stable mild multifactorial spinal stenosis at L3-4.   Electronically Signed   By: Camie Patience M.D.   On: 06/18/2013 09:12    EKG Interpretation   None       MDM   Final diagnoses:  None     MR reviewed.  Patient had been receiving fentanyl IV prior to my assessment, I have given her pyelograms of Valium IV, 1/2 mg of Dilaudid IV, and 125 mg of Solu-Medrol, one hour later.  Her pain has decreased from a 7-4.  She'll be discharged home with by mouth pain control, as well as steroids, and allow her to followup with Dr. Louanne Skye as scheduled tomorrow    Garald Balding, NP 06/19/13 [phone]

## 2013-06-19 NOTE — ED Provider Notes (Signed)
Medical screening examination/treatment/procedure(s) were performed by non-physician practitioner and as supervising physician I was immediately available for consultation/collaboration.    Delora Fuel, MD 63/81/77 [id number]

## 2013-06-19 NOTE — ED Notes (Signed)
Patient ambulated around Pod C hallway with slow, steady gait with stand by assist.

## 2013-06-19 NOTE — ED Notes (Signed)
Baker Janus, [place] in to see patient. Baker Janus PA made aware that patient is requesting more pain and nausea medication.

## 2013-06-20 ENCOUNTER — Telehealth (INDEPENDENT_AMBULATORY_CARE_PROVIDER_SITE_OTHER): Payer: Self-pay

## 2013-06-20 DIAGNOSIS — R110 Nausea: Secondary | ICD-10-CM

## 2013-06-20 NOTE — Telephone Encounter (Signed)
Called pt to notify her that Dr Johney [place] wants her to get a UGI now since pt is still having severe issues with nausea after surgery. I scheduled the pt for the UGI on 06/21/13 arrive at 10:15/10:30 at [place]. I advised pt that she could not have anything to eat or drink after midnight along with no medicine. The pt understands.

## 2013-06-21 ENCOUNTER — Other Ambulatory Visit (INDEPENDENT_AMBULATORY_CARE_PROVIDER_SITE_OTHER): Payer: Self-pay | Admitting: Surgery

## 2013-06-21 ENCOUNTER — Ambulatory Visit
Admission: RE | Admit: 2013-06-21 | Discharge: 2013-06-21 | Disposition: A | Discharge status: Home / Self Care | Payer: Medicare HMO | Source: Ambulatory Visit | Attending: Surgery | Admitting: Surgery

## 2013-06-21 DIAGNOSIS — R110 Nausea: Secondary | ICD-10-CM

## 2013-06-22 ENCOUNTER — Other Ambulatory Visit: Payer: Self-pay | Admitting: Internal Medicine

## 2013-06-24 ENCOUNTER — Other Ambulatory Visit: Payer: Self-pay | Admitting: Emergency Medicine

## 2013-06-24 ENCOUNTER — Telehealth (INDEPENDENT_AMBULATORY_CARE_PROVIDER_SITE_OTHER): Payer: Self-pay

## 2013-06-24 DIAGNOSIS — R110 Nausea: Secondary | ICD-10-CM

## 2013-06-24 MED ORDER — PROMETHAZINE HCL 12.5 MG PO TABS: 12.5000 mg | ORAL_TABLET | Freq: Four times a day (QID) | ORAL | Status: DC | PRN

## 2013-06-24 MED ORDER — ONDANSETRON 4 MG PO TBDP: 4.0000 mg | ORAL_TABLET | Freq: Three times a day (TID) | ORAL | Status: DC | PRN

## 2013-06-24 NOTE — Telephone Encounter (Signed)
Called pt to r/s her appt from 2/17 to 2/18 per the bad weather. The pt understands. The pt is requesting a refill on the nausea medicine. I spoke to Dr Johney Maine b/c he was sitting beside me. Per Dr Johney Maine we will prescribe Phenergan 12.5 #20 x 5rf's along with the pt needing to take her Reglan as prescribed per Dr Johney Maine. If the pt does not get any relief from Phenergan then we will switch her to Zofran. The pt was told the pt will need prior authorization for the Zofran so that is why we will try Phenergan per Dr Johney Maine.

## 2013-06-25 ENCOUNTER — Encounter (INDEPENDENT_AMBULATORY_CARE_PROVIDER_SITE_OTHER): Payer: Medicare HMO | Admitting: Surgery

## 2013-06-26 ENCOUNTER — Ambulatory Visit (INDEPENDENT_AMBULATORY_CARE_PROVIDER_SITE_OTHER): Payer: Medicare HMO | Admitting: Surgery

## 2013-06-26 ENCOUNTER — Encounter (INDEPENDENT_AMBULATORY_CARE_PROVIDER_SITE_OTHER): Payer: Self-pay | Admitting: Surgery

## 2013-06-26 VITALS — BP 138/70 | HR 72 | Temp 98.4°F | Resp 14 | Ht 64.0 in | Wt 129.6 lb

## 2013-06-26 DIAGNOSIS — F411 Generalized anxiety disorder: Secondary | ICD-10-CM

## 2013-06-26 DIAGNOSIS — E43 Unspecified severe protein-calorie malnutrition: Secondary | ICD-10-CM

## 2013-06-26 DIAGNOSIS — K440 Diaphragmatic hernia with obstruction, without gangrene: Secondary | ICD-10-CM

## 2013-06-26 DIAGNOSIS — F419 Anxiety disorder, unspecified: Secondary | ICD-10-CM

## 2013-06-26 DIAGNOSIS — R110 Nausea: Secondary | ICD-10-CM

## 2013-06-26 DIAGNOSIS — F05 Delirium due to known physiological condition: Secondary | ICD-10-CM

## 2013-06-26 DIAGNOSIS — R410 Disorientation, unspecified: Secondary | ICD-10-CM

## 2013-06-26 MED ORDER — TRAMADOL HCL 50 MG PO TABS: 50.0000 mg | ORAL_TABLET | Freq: Four times a day (QID) | ORAL | Status: DC | PRN

## 2013-06-26 MED ORDER — METOCLOPRAMIDE HCL 5 MG PO TABS: 5.0000 mg | ORAL_TABLET | Freq: Three times a day (TID) | ORAL | Status: DC

## 2013-06-26 MED ORDER — POLYETHYLENE GLYCOL 3350 17 GM/SCOOP PO POWD: 8.5000 g | Freq: Two times a day (BID) | ORAL | Status: DC

## 2013-06-26 NOTE — Progress Notes (Signed)
Subjective:     Patient ID: [name] [name], female   DOB: 1935/10/08, 78 y.o.   MRN: [id number]  HPI   Note: This dictation was prepared with Dragon/digital dictation along with [facility] technology. Any transcriptional errors that result from this process are unintentional.       Amyia Lockhart  Jun 02, 1935 [id number]  Patient Care Team: [name], MD as PCP - General (Internal Medicine) Inda Castle, MD as Consulting Physician (Gastroenterology) Rigoberto Noel, MD as Consulting Physician (Pulmonary Disease) Larey Dresser, MD as Consulting Physician (Cardiology) Adin Hector, MD as Consulting Physician (General Surgery) Jessy Oto, MD as Consulting Physician (Orthopedic Surgery)  Procedure (Date: 04/24/2013):  POST-OPERATIVE DIAGNOSIS: RECURRENT Paraesophageal hiatal hernia refractory to medical management   PROCEDURE: Procedure(s):  LAPAROSCOPIC LYSIS OF ADHESIONS x 2hours (1/2 case)  Type II mediastinal dissection.  Laparoscopic reduction of paraesophageal hiatal hernia  Primary repair of hiatal hernia over pledgets.  INSERTION OF MESH UltraPro over crural closure and biologic over the esophageal hiatus  Anterior & posterior gastropexy.  Nissen fundoplication 2cm over a [id number] bougie   SURGEON: Surgeon(s):  Adin Hector, MD   This patient returns for surgical re-evaluation.  She comes in with her husband.  She seems discouraged.  Struggled with worsening nausea.  Went to the emergency room.  Was given Zofran.  Switch to Valium.  She wonders if that was a right call.  Called for our advice.  We gave it.  She hesitated to take it.  She wanted to ask again today.  Husband and her arguing/bickering over things  She denies any abdominal pain.  More back pain.  She denies any dysphagia.  Tolerating soft foods well.   Afraid to try anything more than that.  Struggles with nausea, especially in the morning.  Intermittently using Reglan.  As it to start Phenergan.   Some help with samples from the ER of Zofran  No fevers chills or sweats.  Less constipated.  On Valium and Xanax?  Not needing a walker.  Not lightheaded or dizzy.  Not wanting to eat much.  Not really trying supplemental shakes as we had discussed.  No fevers or chills.  Walking without a walker.  Patient Active Problem List   Diagnosis Date Noted  . Dysphagia 05/15/2013  . Nausea 05/13/2013  . Protein-calorie malnutrition, severe 04/25/2013  . Constipation, chronic 04/03/2013  . Anxiety   . MGUS (monoclonal gammopathy of unknown significance) 03/05/2013  . Herniated lumbar intervertebral disc 06/25/2012    Class: Acute  . Vertebral compression fracture 06/23/2012  . GASTRITIS 05/13/2010  . CAD, UNSPECIFIED SITE 08/28/2008  . ESOPHAGEAL STRICTURE 08/28/2008  . ASTHMA 07/29/2008  . Incarcerated recurrent paraesophageal hiatal hernia s/p lap redo repair [id number] 01/31/2008  . HYPERTENSION 07/25/2007  . HYPERLIPIDEMIA 07/24/2007    Past Medical History  Diagnosis Date  . CAD (coronary artery disease)     LHC 2003 with 40% pLAD, 30% mLAD, 100% D1 (moderate vessel), 100%  D2 (small vessel), 95% mid small OM2.  Pt had PTCA to D1;  D2 and OM2 small vessels and tx medically;  Last Myoview (2012): anterior infarct seen with scar, no ischemia. EF normal. Pt managed medically;  Lexiscan Myoview (12/14):  Low risk; ant scar with very mild peri-infarct ischemia; EF 55% with ant AK   . Recurrent aspiration bronchitis/pneumonia     PFTs 12/09 FVC 97%, FEV1 101%, ratio 77%, TLC 109%. No significant obstruction or restriction.   . Hypertension   .  Hyperlipidemia   . Esophageal stricture   . Diverticulosis of colon   . Gastroesophageal reflux disease with hiatal hernia   . Iron deficiency anemia   . Gastritis   . GERD (gastroesophageal reflux disease)   . Hiatal hernia   . Asthma   . Blood transfusion   . Cough   . Wheezing   . Weakness   . Trouble swallowing   . Abdominal pain   . Hiatal  hernia   . Shortness of breath   . Anxiety   . Complication of anesthesia   . PONV (postoperative nausea and vomiting)   . TIA (transient ischemic attack)   . BRONCHITIS, ACUTE 07/25/2007         . DIVERTICULOSIS, COLON 08/28/2008    Qualifier: Diagnosis of  By: [name], [name], [name]    . IRON DEFICIENCY ANEMIA, HX OF 07/24/2007    Qualifier: Diagnosis of  By: Tilden Dome      Past Surgical History  Procedure Laterality Date  . Back surgery    . Cholecystectomy    . Bladder repair    . Hemorrhoid surgery    . Cholecystectomy      Patient unsure of date.  [name] Appendectomy      patient unsure of date  . Total abdominal hysterectomy  1975    partial  . Coronary angioplasty  11/16/2001  . Eye surgery  11/2000    bilateral cataracts with lens implant  . Hiatal hernia repair  06/03/2011    Procedure: LAPAROSCOPIC REPAIR OF HIATAL HERNIA;  Surgeon: Adin Hector, MD;  Location: [facility];  Service: General;  Laterality: N/A;  . Laparoscopic nissen fundoplication  [id number]    Procedure: LAPAROSCOPIC NISSEN FUNDOPLICATION;  Surgeon: Adin Hector, MD;  Location: [facility];  Service: General;  Laterality: N/A;  . Lumbar laminectomy/decompression microdiscectomy Right 06/26/2012    Procedure: LUMBAR LAMINECTOMY/DECOMPRESSION MICRODISCECTOMY;  Surgeon: Jessy Oto, MD;  Location: [facility];  Service: Orthopedics;  Laterality: Right;  RIGHT L4-5 MICRODISCECTOMY  . Esophagogastroduodenoscopy N/A 03/15/2013    Procedure: ESOPHAGOGASTRODUODENOSCOPY (EGD);  Surgeon: Jerene Bears, MD;  Location: [facility];  Service: Endoscopy;  Laterality: N/A;  . Esophageal manometry N/A 03/25/2013    Procedure: ESOPHAGEAL MANOMETRY (EM);  Surgeon: Inda Castle, MD;  Location: [facility];  Service: Endoscopy;  Laterality: N/A;  . Hiatal hernia repair N/A 04/24/2013    Procedure: LAPAROSCOPIC  REPAIR RECURRENT PARASOPHAGEAL HIATAL HERNIA WITH FUNDOPLICATION;  Surgeon: Adin Hector, MD;  Location: [facility];   Service: General;  Laterality: N/A;  . Insertion of mesh N/A 04/24/2013    Procedure: INSERTION OF MESH;  Surgeon: Adin Hector, MD;  Location: [facility];  Service: General;  Laterality: N/A;  . Laparoscopic lysis of adhesions N/A 04/24/2013    Procedure: LAPAROSCOPIC LYSIS OF ADHESIONS;  Surgeon: Adin Hector, MD;  Location: [facility];  Service: General;  Laterality: N/A;    History   Social History  . Marital Status: Married    Spouse Name: N/A    Number of Children: N/A  . Years of Education: N/A   Occupational History  . Retired     worked at [employer]  . Smoking status: Never Smoker   . Smokeless tobacco: Never Used  . Alcohol Use: No  . Drug Use: No  . Sexual Activity: No   Other Topics Concern  . Not on file   Social History Narrative   Lives  with husband.    Family History  Problem Relation Age of Onset  . Cancer Mother     colon   . Colon cancer Mother 51  . Heart disease Father 64    heart attack    Current Outpatient Prescriptions  Medication Sig Dispense Refill  . Aclidinium Bromide (TUDORZA PRESSAIR) 400 MCG/ACT AEPB Inhale 1 puff into the lungs 2 (two) times daily.      [name] ALPRAZolam (XANAX) 0.5 MG tablet Take 0.5 mg by mouth 3 (three) times daily as needed for sleep or anxiety.       [name] amLODipine (NORVASC) 10 MG tablet Take 10 mg by mouth every morning.       . calcitonin, salmon, (MIACALCIN/FORTICAL) 200 UNIT/ACT nasal spray Place 1 spray into the nose every morning. She alternates nostrils every other day.      . Cholecalciferol (VITAMIN D3) 2000 UNITS TABS Take 6,000 Units by mouth daily.       . Cyanocobalamin (VITAMIN B 12 PO) Take 1 tablet by mouth every morning.       . diazepam (VALIUM) 5 MG tablet Take 1 tablet (5 mg total) by mouth every 6 (six) hours as needed for anxiety.  10 tablet  0  . docusate sodium 100 MG CAPS Take 100 mg by mouth 2 (two) times daily.  10 capsule  0  . losartan (COZAAR) 100 MG  tablet TAKE ONE TABLET BY MOUTH EVERY DAY **DISCONTUNIE BENICAR**  90 tablet  1  . metoCLOPramide (REGLAN) 10 MG tablet Take 1 tablet (10 mg total) by mouth every 6 (six) hours as needed (Nausea).  20 tablet  0  . metoCLOPramide (REGLAN) 5 MG tablet Take 1 tablet (5 mg total) by mouth 4 (four) times daily.  30 tablet  2  . metoprolol tartrate (LOPRESSOR) 25 MG tablet Take 12.5 mg by mouth at bedtime.      . mometasone-formoterol (DULERA) 200-5 MCG/ACT AERO Inhale 2 puffs into the lungs 2 (two) times daily.      . Multiple Vitamin (MULTIVITAMIN WITH MINERALS) TABS Take 1 tablet by mouth every morning.       . ondansetron (ZOFRAN ODT) 4 MG disintegrating tablet Take 1 tablet (4 mg total) by mouth every 8 (eight) hours as needed for nausea or vomiting.  20 tablet  0  . ondansetron (ZOFRAN ODT) 4 MG disintegrating tablet Take 1 tablet (4 mg total) by mouth every 8 (eight) hours as needed for nausea or vomiting.  30 tablet  5  . OVER THE COUNTER MEDICATION Take 1,000 mg by mouth every other day. [place]al Calcium 1000mg       . OVER THE COUNTER MEDICATION Take 400 mg by mouth 3 (three) times daily. Magnesium Carbonate 400mg       . oxyCODONE-acetaminophen (PERCOCET/ROXICET) 5-325 MG per tablet Take 1-2 tablets by mouth every 6 (six) hours as needed for severe pain.  20 tablet  0  . pantoprazole (PROTONIX) 40 MG tablet Take 40 mg by mouth 2 (two) times daily.      . potassium chloride (K-DUR) 10 MEQ tablet Take 1 tablet (10 mEq total) by mouth 2 (two) times daily.  60 tablet  1  . pravastatin (PRAVACHOL) 40 MG tablet Take 40 mg by mouth at bedtime.       . predniSONE (DELTASONE) 20 MG tablet 3 Tabs PO Days 1-3, then 2 tabs PO Days 4-6, then 1 tab PO Day 7-9, then Half Tab PO Day 10-12  20 tablet  0  .  promethazine (PHENERGAN) 12.5 MG tablet Take 1 tablet (12.5 mg total) by mouth every 6 (six) hours as needed for nausea or vomiting.  20 tablet  5  . senna (SENOKOT) 8.6 MG TABS tablet Take 1 tablet (8.6 mg total)  by mouth daily.  120 each  0  . traMADol (ULTRAM) 50 MG tablet Take 1-2 tablets (50-100 mg total) by mouth every 6 (six) hours as needed for moderate pain or severe pain.  40 tablet  0   No current facility-administered medications for this visit.     Allergies  Allergen Reactions  . Aspartame Diarrhea and Nausea And Vomiting  . Daliresp [Roflumilast] Nausea And Vomiting  . Hydrocodone Nausea Only  . Oxycodone Nausea Only  . Phenergan [Promethazine Hcl] Other (See Comments)    "Doesn't work"  . Ace Inhibitors Cough    Per Dr Melvyn Novas pulmonology 2013    BP 138/70  Pulse 72  Temp(Src) 98.4 F (36.9 C) (Oral)  Resp 14  Ht 5\' 4"  (1.626 m)  Wt 129 lb 9.6 oz (58.786 kg)  BMI 22.23 kg/m2  Ct Chest W Contrast  05/13/2013   CLINICAL DATA:  Nausea and abdominal pain. Hiatal hernia repair 3 weeks prior. Rule out abscess.  EXAM: CT CHEST, ABDOMEN, AND PELVIS WITH CONTRAST  TECHNIQUE: Multidetector CT imaging of the chest, abdomen and pelvis was performed following the standard protocol during bolus administration of intravenous contrast.  CONTRAST:  175mL OMNIPAQUE IOHEXOL 300 MG/ML  SOLN  COMPARISON:  Chest CT 03/12/2013.  FINDINGS: CT CHEST FINDINGS  THORACIC INLET/BODY WALL:  No acute abnormality.  MEDIASTINUM:  Normal heart size. No pericardial effusion. Diffuse atherosclerosis, including the coronary arteries. There is suggestion of subendocardial fibro fatty scarring along the LAD distribution, compatible with remote infarct. No evidence of acute vascular abnormality.  Interval repair of recurrent hiatal hernia. There is a tight Nissen wrap with extra luminal high-density material consistent with pledgets (rather than extra luminal contrast). The distal esophagus and gastric wrap is thickened. There is fluid accumulating around the posterior and right lateral distal esophagus measuring approximately 2 x 1 x 1.5 cm. No well-defined enhancing wall or surrounding fat infiltration.  LUNG WINDOWS:  No  consolidation.  No effusion.  No suspicious pulmonary nodule.  OSSEOUS:  No acute fracture.  No suspicious lytic or blastic lesions.  CT ABDOMEN AND PELVIS FINDINGS  BODY WALL: Pelvic floor laxity with rectal prolapse.  Liver: Atrophy of the lateral segment left lobe liver, with intrahepatic biliary ductal dilatation.  Biliary: Cholecystectomy.  Chronic, diffuse biliary ductal dilation.  Pancreas: Unremarkable.  Spleen: No focal abnormality.  Adrenals: Unremarkable.  Kidneys and ureters: No hydronephrosis or stone. Tiny presumed cyst in the interpolar left kidney.  Bladder: Unremarkable.  Reproductive: Hysterectomy.  Bowel: No obstruction.  Retroperitoneum: No mass or adenopathy.  Peritoneum: No free fluid or gas.  Vascular: No acute abnormality.  OSSEOUS: Unchanged linear sclerosis in the left more than right sacral ala, consistent with previous insufficiency fractures. Remote L1 and L3 compression fractures with unchanged height loss greater than 50%. Remote 12 superior endplate fracture. L4-5 anterolisthesis related to advanced facet osteoarthritis. No acute fracture seen  IMPRESSION: 1. Distal esophageal thickening/inflammation status post recent hiatal hernia repair with Nissen fundoplication. There is a 2 x 1 x 1.5 cm fluid collection along the lower right esophagus which is likely incidental/postoperative, but is sterility indeterminate by imaging. No evidence of esophageal leak. 2. Chronic intra-abdominal and intrathoracic findings are stable from recent CT comparisons, described  above.   Electronically Signed   By: Jorje Guild M.D.   On: 05/13/2013 07:13   Ct Abdomen Pelvis W Contrast  05/13/2013   CLINICAL DATA:  Nausea and abdominal pain. Hiatal hernia repair 3 weeks prior. Rule out abscess.  EXAM: CT CHEST, ABDOMEN, AND PELVIS WITH CONTRAST  TECHNIQUE: Multidetector CT imaging of the chest, abdomen and pelvis was performed following the standard protocol during bolus administration of intravenous  contrast.  CONTRAST:  136mL OMNIPAQUE IOHEXOL 300 MG/ML  SOLN  COMPARISON:  Chest CT 03/12/2013.  FINDINGS: CT CHEST FINDINGS  THORACIC INLET/BODY WALL:  No acute abnormality.  MEDIASTINUM:  Normal heart size. No pericardial effusion. Diffuse atherosclerosis, including the coronary arteries. There is suggestion of subendocardial fibro fatty scarring along the LAD distribution, compatible with remote infarct. No evidence of acute vascular abnormality.  Interval repair of recurrent hiatal hernia. There is a tight Nissen wrap with extra luminal high-density material consistent with pledgets (rather than extra luminal contrast). The distal esophagus and gastric wrap is thickened. There is fluid accumulating around the posterior and right lateral distal esophagus measuring approximately 2 x 1 x 1.5 cm. No well-defined enhancing wall or surrounding fat infiltration.  LUNG WINDOWS:  No consolidation.  No effusion.  No suspicious pulmonary nodule.  OSSEOUS:  No acute fracture.  No suspicious lytic or blastic lesions.  CT ABDOMEN AND PELVIS FINDINGS  BODY WALL: Pelvic floor laxity with rectal prolapse.  Liver: Atrophy of the lateral segment left lobe liver, with intrahepatic biliary ductal dilatation.  Biliary: Cholecystectomy.  Chronic, diffuse biliary ductal dilation.  Pancreas: Unremarkable.  Spleen: No focal abnormality.  Adrenals: Unremarkable.  Kidneys and ureters: No hydronephrosis or stone. Tiny presumed cyst in the interpolar left kidney.  Bladder: Unremarkable.  Reproductive: Hysterectomy.  Bowel: No obstruction.  Retroperitoneum: No mass or adenopathy.  Peritoneum: No free fluid or gas.  Vascular: No acute abnormality.  OSSEOUS: Unchanged linear sclerosis in the left more than right sacral ala, consistent with previous insufficiency fractures. Remote L1 and L3 compression fractures with unchanged height loss greater than 50%. Remote 12 superior endplate fracture. L4-5 anterolisthesis related to advanced facet  osteoarthritis. No acute fracture seen  IMPRESSION: 1. Distal esophageal thickening/inflammation status post recent hiatal hernia repair with Nissen fundoplication. There is a 2 x 1 x 1.5 cm fluid collection along the lower right esophagus which is likely incidental/postoperative, but is sterility indeterminate by imaging. No evidence of esophageal leak. 2. Chronic intra-abdominal and intrathoracic findings are stable from recent CT comparisons, described above.   Electronically Signed   By: Jorje Guild M.D.   On: 05/13/2013 07:13   Dg Abd Acute W/chest  05/13/2013   CLINICAL DATA:  Right lower quadrant pain, recent hiatal hernia fundoplication.  EXAM: ACUTE ABDOMEN SERIES (ABDOMEN 2 VIEW & CHEST 1 VIEW)  COMPARISON:  Acute abdominal series April 22, 2013  FINDINGS: Cardiomediastinal silhouette is nonsuspicious, unchanged. Mild chronic interstitial changes with scarring in left lung base. No pleural effusions or focal consolidations. No pneumothorax.  Bowel gas pattern is nondilated and nonobstructive. Surgical clips in the right abdomen likely reflect cholecystectomy. Surgical clips at the gastroesophageal junction. No intra-abdominal mass effect or pathologic calcifications. No free air. Phleboliths in the pelvis. Patient is osteopenic. Mild levoscoliosis of the lumbar spine is unchanged. Mild L1 compression fracture may be new, chronic L3 moderate to severe compression deformity.  IMPRESSION: No acute cardiopulmonary process.  Nonspecific bowel gas pattern.  Osteopenia, chronic L3 moderate compression fracture with new appearing  L1 compression fracture, recommend correlation with point tenderness.   Electronically Signed   By: Elon Alas   On: 05/13/2013 02:03   Dg Esophagus W/water Sol Cm  05/14/2013   CLINICAL DATA:  Post redo fundoplication.  EXAM: ESOPHOGRAM/BARIUM SWALLOW  TECHNIQUE: Single contrast examination was performed using  water soluble.  COMPARISON:  05/13/2013 CT.  FLUOROSCOPY  TIME:  2 min and 5 seconds  FINDINGS: Radiopaque material at the gastroesophageal junction consistent with material utilized for procedure in addition to surgical clips.  Circumferential narrowing at the reconstructed gastroesophageal junction without leak identified.  There is slow transit time through this region which may be partially explained by postoperative edema in addition to the operative procedure.  With the patient in an upright position, ingested contrast remains within the distal esophagus above this region of circumferential narrowing but subsequently clears with dry swallowing.  Remote compression fracture L1 and L3 vertebral body incompletely assessed.  IMPRESSION: Radiopaque material at the gastroesophageal junction consistent with material utilized for procedure in addition to surgical clips.  Circumferential narrowing at the reconstructed gastroesophageal junction without leak identified.  There is slow transit time through this region which may be partially explained by postoperative edema in addition to the operative procedure.  With the patient in an upright position, ingested contrast remains within the distal esophagus above this region of circumferential narrowing but subsequently clears with dry swallowing.  This is a call report.   Electronically Signed   By: Chauncey Cruel M.D.   On: 05/14/2013 10:36   Dg Duanne Limerick W/kub  06/21/2013   CLINICAL DATA:  Severe nausea. Recurrent paraesophageal hernia with repair and fundoplication. Evaluate pyloroplasty.  EXAM: UPPER GI SERIES W/ KUB  TECHNIQUE: After obtaining a scout radiograph a routine upper GI series was performed using thin barium  COMPARISON:  DG UGI W/HIGH DENSITY W/KUB dated 03/14/2013  FLUOROSCOPY TIME:  1 min, 36 seconds  FINDINGS: When compared to prior study, there is been reduction of the previously seen large hiatal hernia. Defect noted at the proximal stomach/GE junction compatible with prior fundoplication.  There is severe  esophageal dysmotility with disruption of primary peristaltic waves. Extensive tertiary contractions. Full column stasis of barium in the esophagus.  There is normal emptying of the stomach. No evidence of contrast extravasation. No evidence of gastric outlet obstruction. No ulceration, mass or fold thickening within the stomach or duodenum.  Scout film of the abdomen demonstrates normal bowel gas pattern with moderate stool in the colon. Prior cholecystectomy.  IMPRESSION: Interval repair of the hiatal hernia and fundoplication.  Severe esophageal dysmotility with esophageal spasm and full column stasis.  Stomach and duodenum unremarkable.   Electronically Signed   By: Rolm Baptise M.D.   On: 06/21/2013 11:33    Review of Systems  Constitutional: Negative for fever, chills and diaphoresis.  HENT: Negative for ear pain, sore throat and trouble swallowing.   Eyes: Negative for photophobia and visual disturbance.  Respiratory: Negative for cough, choking and shortness of breath.   Cardiovascular: Negative for chest pain and palpitations.  Gastrointestinal: Positive for constipation. Negative for nausea, vomiting, abdominal pain, diarrhea, blood in stool, abdominal distention, anal bleeding and rectal pain.  Genitourinary: Negative for dysuria, frequency, decreased urine volume and difficulty urinating.  Musculoskeletal: Positive for back pain and myalgias. Negative for gait problem.  Skin: Negative for color change, pallor and rash.  Neurological: Negative for dizziness, speech difficulty, weakness and numbness.  Hematological: Negative for adenopathy.  Psychiatric/Behavioral: Negative for confusion  and agitation. The patient is nervous/anxious.        Objective:   Physical Exam  Constitutional: She is oriented to person, place, and time. She appears well-developed and well-nourished. No distress.  HENT:  Head: Normocephalic.  Mouth/Throat: Oropharynx is clear and moist. No oropharyngeal  exudate.  Eyes: Conjunctivae and EOM are normal. Pupils are equal, round, and reactive to light. No scleral icterus.  Neck: Normal range of motion. No tracheal deviation present.  Cardiovascular: Normal rate and intact distal pulses.   Pulmonary/Chest: Effort normal. No respiratory distress. She exhibits no tenderness.  Abdominal: Soft. She exhibits no distension. There is no tenderness. Hernia confirmed negative in the right inguinal area and confirmed negative in the left inguinal area.  Incisions clean with normal healing ridges.  No hernias  Genitourinary: No vaginal discharge found.  Musculoskeletal: Normal range of motion.       Thoracic back: Normal. She exhibits no tenderness, no bony tenderness, no pain and no spasm.       Lumbar back: She exhibits pain and spasm. She exhibits no deformity and no laceration.       Back:  Lymphadenopathy:       Right: No inguinal adenopathy present.       Left: No inguinal adenopathy present.  Neurological: She is alert and oriented to person, place, and time. She displays tremor. No cranial nerve deficit. She exhibits normal muscle tone. She displays no seizure activity. Coordination normal.  Mild left hand tremor  Skin: Skin is warm and dry. No rash noted. She is not diaphoretic.  Psychiatric: Her behavior is normal. Judgment and thought content normal. Her mood appears anxious. Her speech is not rapid and/or pressured and not delayed. She is not agitated and not aggressive. Cognition and memory are normal.  Consolable.  Nearly tearful at the beginning of visit.  Smiling at the end of the visit.       Assessment:     Recovering slowly from repair of recurrent paraesophageal hernia.  Chronic nausea still had a major problem.  Worsening chronic lumbar low back pain.     Plan:     Increase activity as tolerated to regular activity.  WALK MORE.  Low impact exercise such as walking an hour a day at least ideal.  Do not push through pain.   Eventually would like to get her off narcotics, but I do not think that is feasible with her severe lumbar back pain.  Tramadol seems to be the only narcotic she can tolerate.  Refilled one more time.  After 3 months, deferred to her primary care physician and Dr. Louanne Skye on that.  Deferred to the use of benzodiazepines and anxiolytics to her primary care physician.  Diet as tolerated.  Continue to advance diet consistency per post esophageal surgery handout/recommendations.  While the upper GIs concerning for perhaps slow transit across her abdomen she has no dysphagia or reflux.  We will try and give time for the wrap to open up more.  She needs to eat more.  Take boost/Ensure twice a day until she is eating better.  Reglan with every meal.  Phenergan for breakthrough nausea.  If this does not work, switch to Zofran.  Issue of preauthorization with her insurance company.  If that does not help, consider upper endoscopy by Dr. Deatra [name] to see if balloon dialation will help that out.  I strongly recommend that she take a fiber supplement at least twice a day.  Miralax.  Stop stool  softeners and laxatives.  Pick one thing to start with.  Low fat high fiber diet ideal.  Bowel regimen with 30 g fiber a day and fiber supplement as needed to avoid problems.  Return to clinic 3 weeks, sooner as needed.   Instructions discussed.  Followup with primary care physician for other health issues as would normally be done.  Consider screening for malignancies (breast, prostate, colon, melanoma, etc) as appropriate.  Questions answered.  By the end of the visit, she was smiling and feeling better.  The patient & husband expressed understanding and appreciation

## 2013-06-26 NOTE — Patient Instructions (Addendum)
Please consider the recommendations that we have given you today:  Take Reglan with every meal to control nausea.  He used Phenergan for breakthrough nausea.  If you are still struggling with nausea by March, call us.  We will work with your insurance company to [employer] as a new medication.  If your nausea is worsening or not controlled, youe may need endoscopy by Dr. [name] to dilate your wrap and rule out a stricture next month  Walk or exercise an hour a day  Each more.  Take boost or Ensure 2 cans a day minimum.  Use MiraLax only to help fight your constipation.  Take 2 doses a day.  Goal is one bowel movement a day.  Okay to continue to work with Dr. Louanne Skye to get your back fixed  See the Handout(s) we have given you.  Please call our office at [phone] if you wish to schedule surgery or if you have further questions / concerns.    EATING AFTER YOUR ESOPHAGEAL SURGERY (Stomach Fundoplication, Hiatal Hernia repair, Achalasia surgery, etc)  After your esophageal surgery, expect some sticking with swallowing over the next 1-2 months.    If food sticks when you eat, it is called "dysphagia".  This is due to swelling around your esophagus at the wrap & hiatal diaphragm repair.  It will gradually ease off over the next few months.  To help you through this temporary phase, we start you out on a pureed (blenderized) diet.  Your first meal in the hospital was thin liquids.  You should have been given a pureed diet by the time you left the hospital.  We ask patients to stay on a pureed diet for the first 2-3 weeks to avoid anything getting "stuck" near your recent surgery.  Don't be alarmed if your ability to swallow doesn't progress according to this plan.  Everyone is different and some diets can advance more or less quickly.     Some BASIC RULES to follow are:  Maintain an upright position whenever eating or drinking.  Take small bites - just a teaspoon size bite at a  time.  Eat slowly.  It may also help to eat only one food at a time.  Consider nibbling through smaller, more frequent meals & avoid the urge to eat BIG meals  Do not push through feelings of fullness, nausea, or bloatedness  Do not mix solid foods and liquids in the same mouthful  Try not to "wash foods down" with large gulps of liquids. Avoid carbonated (bubbly/fizzy) drinks.  Understand that it will be hard to burp and belch at first.  This gradually improves with time.  Expect to be more gassy/flatulent/bloated initially.  Walking will help you work through that.  Maalox/Gas-X can help as well.  Eat in a relaxed atmosphere & minimize distractions.  Avoid talking while eating.    Do not use straws.  Following each meal, sit in an upright position (90 degree angle) for 60 to 90 minutes.  Going for a short walk can help as well  If food does stick, don't panic.  Try to relax and let the food pass on its own.  Sipping WARM LIQUID such as strong hot black tea can also help slide it down.   Be gradual in changes & use common sense:  -If you easily tolerating a certain "level" of foods, advance to the next level gradually -If you are having trouble swallowing a particular food, then avoid it.   -  If food is sticking when you advance your diet, go back to thinner previous diet (the lower LEVEL) for 1-2 days.  LEVEL 1 = PUREED DIET  Do for the first 2 WEEKS AFTER SURGERY  -Foods in this group are pureed or blenderized to a smooth, mashed potato-like consistency.  -If necessary, the pureed foods can keep their shape with the addition of a thickening agent.   -Meat should be pureed to a smooth, pasty consistency.  Hot broth or gravy may be added to the pureed meat, approximately 1 oz. of liquid per 3 oz. serving of meat. -CAUTION:  If any foods do not puree into a smooth consistency, swallowing will be more difficult.  (For example, nuts or seeds sometimes do not blend well.)  Hot  Foods Cold Foods  Pureed scrambled eggs and cheese Pureed cottage cheese  Baby cereals Thickened juices and nectars  Thinned cooked cereals (no lumps) Thickened milk or eggnog  Pureed [place] toast or pancakes Ensure  Mashed potatoes Ice cream  Pureed parsley, au gratin, scalloped potatoes, candied sweet potatoes Fruit or [place] ice, sherbet  Pureed buttered or alfredo noodles Plain yogurt  Pureed vegetables (no corn or peas) Instant breakfast  Pureed soups and creamed soups Smooth pudding, mousse, custard  Pureed scalloped apples Whipped gelatin  Gravies Sugar, syrup, honey, jelly  Sauces, cheese, tomato, barbecue, white, creamed Cream  Any baby food Creamer  Alcohol in moderation (not beer or champagne) Margarine  Coffee or tea Mayonnaise   Ketchup, mustard   Apple sauce   SAMPLE MENU:  PUREED DIET Breakfast Lunch Dinner   Orange juice, 1/2 cup  Cream of wheat, 1/2 cup  Pineapple juice, 1/2 cup  Pureed [place], barley soup, 3/4 cup  Pureed Hawaiian chicken, 3 oz   Scrambled eggs, mashed or blended with cheese, 1/2 cup  Tea or coffee, 1 cup   Whole milk, 1 cup   Non-dairy creamer, 2 Tbsp.  Mashed potatoes, 1/2 cup  Pureed cooled broccoli, 1/2 cup  Apple sauce, 1/2 cup  Coffee or tea  Mashed potatoes, 1/2 cup  Pureed spinach, 1/2 cup  Frozen yogurt, 1/2 cup  Tea or coffee      LEVEL 2 = SOFT DIET  After your first 2 weeks, you can advance to a soft diet.   Keep on this diet until everything goes down easily.  Hot Foods Cold Foods  White fish Cottage cheese  Stuffed fish Junior baby fruit  Baby food meals Semi thickened juices  Minced soft cooked, scrambled, poached eggs nectars  Souffle & omelets Ripe mashed bananas  Cooked cereals Canned fruit, pineapple sauce, milk  potatoes Milkshake  Buttered or Alfredo noodles Custard  Cooked cooled vegetable Puddings, including tapioca  Sherbet Yogurt  Vegetable soup or alphabet soup Fruit ice, [place] ice   Gravies Whipped gelatin  Sugar, syrup, honey, jelly Junior baby desserts  Sauces:  Cheese, creamed, barbecue, tomato, white Cream  Coffee or tea Margarine   SAMPLE MENU:  LEVEL 2 Breakfast Lunch Dinner   Orange juice, 1/2 cup  Oatmeal, 1/2 cup  Scrambled eggs with cheese, 1/2 cup  Decaffeinated tea, 1 cup  Whole milk, 1 cup  Non-dairy creamer, 2 Tbsp  Pineapple juice, 1/2 cup  Minced beef, 3 oz  Gravy, 2 Tbsp  Mashed potatoes, 1/2 cup  Minced fresh broccoli, 1/2 cup  Applesauce, 1/2 cup  Coffee, 1 cup  [place], barley soup, 3/4 cup  Minced Hawaiian chicken, 3 oz  Mashed potatoes, 1/2 cup  Cooked spinach, 1/2 cup  Frozen yogurt, 1/2 cup  Non-dairy creamer, 2 Tbsp      LEVEL 3 = CHOPPED DIET  -After all the foods in level 2 (soft diet) are passing through well you should advance up to more chopped foods.  -It is still important to cut these foods into small pieces and eat slowly.  Hot Foods Cold Foods  Poultry Cottage cheese  Chopped Swedish meatballs Yogurt  Meat salads (ground or flaked meat) Milk  Flaked fish (tuna) Milkshakes  Poached or scrambled eggs Soft, cold, dry cereal  Souffles and omelets Fruit juices or nectars  Cooked cereals Chopped canned fruit  Chopped [place] toast or pancakes Canned fruit cocktail  Noodles or pasta (no rice) Pudding, mousse, custard  Cooked vegetables (no frozen peas, corn, or mixed vegetables) Green salad  Canned small sweet peas Ice cream  Creamed soup or vegetable soup Fruit ice, [place] ice  Pureed vegetable soup or alphabet soup Non-dairy creamer  Ground scalloped apples Margarine  Gravies Mayonnaise  Sauces:  Cheese, creamed, barbecue, tomato, white Ketchup  Coffee or tea Mustard   SAMPLE MENU:  LEVEL 3 Breakfast Lunch Dinner   Orange juice, 1/2 cup  Oatmeal, 1/2 cup  Scrambled eggs with cheese, 1/2 cup  Decaffeinated tea, 1 cup  Whole milk, 1 cup  Non-dairy creamer, 2 Tbsp  Ketchup, 1  Tbsp  Margarine, 1 tsp  Salt, 1/4 tsp  Sugar, 2 tsp  Pineapple juice, 1/2 cup  Ground beef, 3 oz  Gravy, 2 Tbsp  Mashed potatoes, 1/2 cup  Cooked spinach, 1/2 cup  Applesauce, 1/2 cup  Decaffeinated coffee  Whole milk  Non-dairy creamer, 2 Tbsp  Margarine, 1 tsp  Salt, 1/4 tsp  Pureed [place], barley soup, 3/4 cup  Barbecue chicken, 3 oz  Mashed potatoes, 1/2 cup  Ground fresh broccoli, 1/2 cup  Frozen yogurt, 1/2 cup  Decaffeinated tea, 1 cup  Non-dairy creamer, 2 Tbsp  Margarine, 1 tsp  Salt, 1/4 tsp  Sugar, 1 tsp    LEVEL 4:  REGULAR FOODS  -Foods in this group are soft, moist, regularly textured foods.   -This level includes meat and breads, which tend to be the hardest things to swallow.   -Eat very slowly, chew well and continue to avoid carbonated drinks. -most people are at this level in 4-6 weeks  Hot Foods Cold Foods  Baked fish or skinned Soft cheeses - cottage cheese  Souffles and omelets Cream cheese  Eggs Yogurt  Stuffed shells Milk  Spaghetti with meat sauce Milkshakes  Cooked cereal Cold dry cereals (no nuts, dried fruit, coconut)  [place] toast or pancakes Crackers  Buttered toast Fruit juices or nectars  Noodles or pasta (no rice) Canned fruit  Potatoes (all types) Ripe bananas  Soft, cooked vegetables (no corn, lima, or baked beans) Peeled, ripe, fresh fruit  Creamed soups or vegetable soup Cakes (no nuts, dried fruit, coconut)  Canned chicken noodle soup Plain doughnuts  Gravies Ice cream  Bacon dressing Pudding, mousse, custard  Sauces:  Cheese, creamed, barbecue, tomato, white Fruit ice, [place] ice, sherbet  Decaffeinated tea or coffee Whipped gelatin  Pork chops Regular gelatin   Canned fruited gelatin molds   Sugar, syrup, honey, jam, jelly   Cream   Non-dairy   Margarine   Oil   Mayonnaise   Ketchup   Mustard    If you have any questions please call our office at [place]:  [phone].  Exercise to Stay Healthy  Exercise helps you become and stay healthy. EXERCISE IDEAS AND TIPS Choose exercises that:  You enjoy.  Fit into your day. You do not need to exercise really hard to be healthy. You can do exercises at a slow or medium level and stay healthy. You can:  Stretch before and after working out.  Try yoga, Pilates, or tai chi.  Lift weights.  Walk fast, swim, jog, run, climb stairs, bicycle, dance, or rollerskate.  Take aerobic classes. Exercises that burn about 150 calories:  Running 1  miles in 15 minutes.  Playing volleyball for 45 to 60 minutes.  Washing and waxing a car for 45 to 60 minutes.  Playing touch football for 45 minutes.  Walking 1  miles in 35 minutes.  Pushing a stroller 1  miles in 30 minutes.  Playing basketball for 30 minutes.  Raking leaves for 30 minutes.  Bicycling 5 miles in 30 minutes.  Walking 2 miles in 30 minutes.  Dancing for 30 minutes.  Shoveling snow for 15 minutes.  Swimming laps for 20 minutes.  Walking up stairs for 15 minutes.  Bicycling 4 miles in 15 minutes.  Gardening for 30 to 45 minutes.  Jumping rope for 15 minutes.  Washing windows or floors for 45 to 60 minutes. Document Released: 05/28/2010 Document Revised: 07/18/2011 Document Reviewed: 05/28/2010 [facility] Patient Information 2014 [place], [place].  Nausea, Adult Nausea is the feeling that you have an upset stomach or have to vomit. Nausea by itself is not likely a serious concern, but it may be an early sign of more serious medical problems. As nausea gets worse, it can lead to vomiting. If vomiting develops, there is the risk of dehydration.  CAUSES   Viral infections.  Food poisoning.  Medicines.  Pregnancy.  Motion sickness.  Migraine headaches.  Emotional distress.  Severe pain from any source.  Alcohol intoxication. HOME CARE INSTRUCTIONS  Get plenty of rest.  Ask your caregiver about specific  rehydration instructions.  Eat small amounts of food and sip liquids more often.  Take all medicines as told by your caregiver. SEEK MEDICAL CARE IF:  You have not improved after 2 days, or you get worse.  You have a headache. SEEK IMMEDIATE MEDICAL CARE IF:   You have a fever.  You faint.  You keep vomiting or have blood in your vomit.  You are extremely weak or dehydrated.  You have dark or bloody stools.  You have severe chest or abdominal pain. MAKE SURE YOU:  Understand these instructions.  Will watch your condition.  Will get help right away if you are not doing well or get worse. Document Released: 06/02/2004 Document Revised: 01/18/2012 Document Reviewed: 01/05/2011 [facility] Patient Information 2014 [place], [place].

## 2013-06-27 ENCOUNTER — Telehealth (INDEPENDENT_AMBULATORY_CARE_PROVIDER_SITE_OTHER): Payer: Self-pay | Admitting: General Surgery

## 2013-06-27 NOTE — Telephone Encounter (Signed)
Pt called to report that she ate breakfast today and felt good.  Later went for a snack, then laid down to rest and became nauseated again.  After reviewing the office note from last visit and discussing with Dr. Clyda Greener assistant, instructed pt to follow the plan outlined at the [place].  She stated that the Phenergan makes her too sleepy, so she will use the Zofran.  Suggested she pour a small amount of gingerale in a glass and stir it to promote it going flat from the carbonation.  Once it is fully free of the bubbles, sip on the chilled gingerale to help settle her stomach more.  She had not thought of that and liked the idea of it and will try that, too.  She will call back prn.

## 2013-07-03 ENCOUNTER — Ambulatory Visit (INDEPENDENT_AMBULATORY_CARE_PROVIDER_SITE_OTHER): Payer: Medicare HMO | Admitting: Emergency Medicine

## 2013-07-03 ENCOUNTER — Encounter: Payer: Self-pay | Admitting: Emergency Medicine

## 2013-07-03 VITALS — BP 122/70 | HR 82 | Temp 98.6°F | Resp 18 | Ht 63.75 in | Wt 135.0 lb

## 2013-07-03 DIAGNOSIS — T300 Burn of unspecified body region, unspecified degree: Secondary | ICD-10-CM

## 2013-07-03 MED ORDER — MOMETASONE FURO-FORMOTEROL FUM 200-5 MCG/ACT IN AERO: 2.0000 | INHALATION_SPRAY | Freq: Two times a day (BID) | RESPIRATORY_TRACT | Status: DC

## 2013-07-03 MED ORDER — SILVER SULFADIAZINE 1 % EX CREA: TOPICAL_CREAM | CUTANEOUS | Status: DC

## 2013-07-03 NOTE — Patient Instructions (Signed)
Burn Care  Your skin is a natural barrier to infection. It is the largest organ of your body. Burns damage this natural protection. To help prevent infection, it is very important to follow your caregiver's instructions in the care of your burn.  Burns are classified as:   First degree. There is only redness of the skin (erythema). No scarring is expected.   Second degree. There is blistering of the skin. Scarring may occur with deeper burns.   Third degree. All layers of the skin are injured, and scarring is expected.  HOME CARE INSTRUCTIONS    Wash your hands well before changing your bandage.   Change your bandage as often as directed by your caregiver.   Remove the old bandage. If the bandage sticks, you may soak it off with cool, clean water.   Cleanse the burn thoroughly but gently with mild soap and water.   Pat the area dry with a clean, dry cloth.   Apply a thin layer of antibacterial cream to the burn.   Apply a clean bandage as instructed by your caregiver.   Keep the bandage as clean and dry as possible.   Elevate the affected area for the first 24 hours, then as instructed by your caregiver.   Only take over-the-counter or prescription medicines for pain, discomfort, or fever as directed by your caregiver.  SEEK IMMEDIATE MEDICAL CARE IF:    You develop excessive pain.   You develop redness, tenderness, swelling, or red streaks near the burn.   The burned area develops yellowish-white fluid (pus) or a bad smell.   You have a fever.  MAKE SURE YOU:    Understand these instructions.   Will watch your condition.   Will get help right away if you are not doing well or get worse.  Document Released: 04/25/2005 Document Revised: 07/18/2011 Document Reviewed: 09/15/2010  [employer] Patient Information 2014 [employer].

## 2013-07-03 NOTE — Progress Notes (Signed)
Subjective:    Patient ID: [name] [name], female    DOB: 12-08-1935, 78 y.o.   MRN: [id number]  HPI Comments: 78 yo female burned arm on heating pad a few days earlier and wants evaluated. She notes she has been using peroxide and neosporin daily since burn occurred. She denies any pain with movement of arm.  Burn   Current Outpatient Prescriptions on File Prior to Visit  Medication Sig Dispense Refill  . Aclidinium Bromide (TUDORZA PRESSAIR) 400 MCG/ACT AEPB Inhale 1 puff into the lungs 2 (two) times daily.      [name] ALPRAZolam (XANAX) 0.5 MG tablet Take 0.5 mg by mouth 3 (three) times daily as needed for sleep or anxiety.       [name] amLODipine (NORVASC) 10 MG tablet Take 10 mg by mouth every morning.       . calcitonin, salmon, (MIACALCIN/FORTICAL) 200 UNIT/ACT nasal spray Place 1 spray into the nose every morning. She alternates nostrils every other day.      . Cholecalciferol (VITAMIN D3) 2000 UNITS TABS Take 6,000 Units by mouth daily.       . Cyanocobalamin (VITAMIN B 12 PO) Take 1 tablet by mouth every morning.       . diazepam (VALIUM) 5 MG tablet Take 1 tablet (5 mg total) by mouth every 6 (six) hours as needed for anxiety.  10 tablet  0  . losartan (COZAAR) 100 MG tablet TAKE ONE TABLET BY MOUTH EVERY DAY **DISCONTUNIE BENICAR**  90 tablet  1  . metoCLOPramide (REGLAN) 5 MG tablet Take 1-2 tablets (5-10 mg total) by mouth 4 (four) times daily -  before meals and at bedtime.  40 tablet  5  . metoprolol tartrate (LOPRESSOR) 25 MG tablet Take 12.5 mg by mouth at bedtime.      . Multiple Vitamin (MULTIVITAMIN WITH MINERALS) TABS Take 1 tablet by mouth every morning.       . ondansetron (ZOFRAN ODT) 4 MG disintegrating tablet Take 1 tablet (4 mg total) by mouth every 8 (eight) hours as needed for nausea or vomiting.  30 tablet  5  . OVER THE COUNTER MEDICATION Take 1,000 mg by mouth every other day. [place] Calcium 1000mg       . OVER THE COUNTER MEDICATION Take 400 mg by mouth 3 (three) times  daily. Magnesium Carbonate 400mg       . pantoprazole (PROTONIX) 40 MG tablet Take 40 mg by mouth 2 (two) times daily.      . polyethylene glycol powder (MIRALAX) powder Take 8.5-34 g by mouth 2 (two) times daily. To correct constipation.  Adjust dose over 1-2 months.  Goal = ~1 bowel movement / day  255 g  5  . potassium chloride (K-DUR) 10 MEQ tablet Take 1 tablet (10 mEq total) by mouth 2 (two) times daily.  60 tablet  1  . pravastatin (PRAVACHOL) 40 MG tablet Take 40 mg by mouth at bedtime.       . promethazine (PHENERGAN) 12.5 MG tablet Take 1 tablet (12.5 mg total) by mouth every 6 (six) hours as needed for nausea or vomiting.  20 tablet  5  . traMADol (ULTRAM) 50 MG tablet Take 1-2 tablets (50-100 mg total) by mouth every 6 (six) hours as needed for moderate pain or severe pain.  40 tablet  0   No current facility-administered medications on file prior to visit.   Allergies  Allergen Reactions  . Aspartame Diarrhea and Nausea And Vomiting  . Daliresp [Roflumilast] Nausea  And Vomiting  . Hydrocodone Nausea Only  . Oxycodone Nausea Only  . Phenergan [Promethazine Hcl] Other (See Comments)    "Doesn't work"  . Ace Inhibitors Cough    Per Dr Melvyn Novas pulmonology 2013   Past Medical History  Diagnosis Date  . CAD (coronary artery disease)     LHC 2003 with 40% pLAD, 30% mLAD, 100% D1 (moderate vessel), 100%  D2 (small vessel), 95% mid small OM2.  Pt had PTCA to D1;  D2 and OM2 small vessels and tx medically;  Last Myoview (2012): anterior infarct seen with scar, no ischemia. EF normal. Pt managed medically;  Lexiscan Myoview (12/14):  Low risk; ant scar with very mild peri-infarct ischemia; EF 55% with ant AK   . Recurrent aspiration bronchitis/pneumonia     PFTs 12/09 FVC 97%, FEV1 101%, ratio 77%, TLC 109%. No significant obstruction or restriction.   . Hypertension   . Hyperlipidemia   . Esophageal stricture   . Diverticulosis of colon   . Gastroesophageal reflux disease with hiatal  hernia   . Iron deficiency anemia   . Gastritis   . GERD (gastroesophageal reflux disease)   . Hiatal hernia   . Asthma   . Blood transfusion   . Cough   . Wheezing   . Weakness   . Trouble swallowing   . Abdominal pain   . Hiatal hernia   . Shortness of breath   . Anxiety   . Complication of anesthesia   . PONV (postoperative nausea and vomiting)   . TIA (transient ischemic attack)   . BRONCHITIS, ACUTE 07/25/2007         . DIVERTICULOSIS, COLON 08/28/2008    Qualifier: Diagnosis of  By: [name], [name], [name]    . IRON DEFICIENCY ANEMIA, HX OF 07/24/2007    Qualifier: Diagnosis of  By: Tilden Dome       Review of Systems  Skin: Positive for wound.   BP 122/70  Pulse 82  Temp(Src) 98.6 F (37 C) (Temporal)  Resp 18  Ht 5' 3.75" (1.619 m)  Wt 135 lb (61.236 kg)  BMI 23.36 kg/m2     Objective:   Physical Exam  Nursing note and vitals reviewed. Constitutional: She is oriented to person, place, and time. She appears well-developed and well-nourished.  HENT:  Head: Normocephalic and atraumatic.  Right Ear: External ear normal.  Left Ear: External ear normal.  Nose: Nose normal.  Mouth/Throat: Oropharynx is clear and moist.  Eyes: Conjunctivae and EOM are normal.  Neck: Normal range of motion.  Musculoskeletal: Normal range of motion. She exhibits no edema and no tenderness.  Lymphadenopathy:    She has no cervical adenopathy.  Neurological: She is alert and oriented to person, place, and time.  Skin: Skin is warm and dry.     Psychiatric: She has a normal mood and affect. Judgment normal.          Assessment & Plan:  1. Left Arm burn from heating pad w/o infection-Silvadene cream AD, stop peroxide/ neosporin. ADvised of wound hygiene and w/c if any symptom increase  SX dulera given use AD. After patient left she called back and said she was having SOB with out reason, she did not have any concerns at office visit. Advised since she was able to carry on  conversation and did not seem to be in distress she could come back in or go to the ER for evaluation.

## 2013-07-04 ENCOUNTER — Other Ambulatory Visit (HOSPITAL_COMMUNITY): Payer: Self-pay | Admitting: Interventional Radiology

## 2013-07-04 DIAGNOSIS — M549 Dorsalgia, unspecified: Secondary | ICD-10-CM

## 2013-07-04 DIAGNOSIS — IMO0002 Reserved for concepts with insufficient information to code with codable children: Secondary | ICD-10-CM

## 2013-07-08 ENCOUNTER — Ambulatory Visit (INDEPENDENT_AMBULATORY_CARE_PROVIDER_SITE_OTHER): Payer: Medicare HMO | Admitting: Internal Medicine

## 2013-07-08 ENCOUNTER — Encounter: Payer: Self-pay | Admitting: Internal Medicine

## 2013-07-08 VITALS — BP 126/64 | HR 76 | Temp 98.1°F | Resp 16 | Wt 130.8 lb

## 2013-07-08 DIAGNOSIS — M545 Low back pain, unspecified: Secondary | ICD-10-CM

## 2013-07-08 DIAGNOSIS — I2510 Atherosclerotic heart disease of native coronary artery without angina pectoris: Secondary | ICD-10-CM

## 2013-07-08 DIAGNOSIS — E785 Hyperlipidemia, unspecified: Secondary | ICD-10-CM

## 2013-07-08 DIAGNOSIS — I10 Essential (primary) hypertension: Secondary | ICD-10-CM

## 2013-07-08 DIAGNOSIS — K2990 Gastroduodenitis, unspecified, without bleeding: Secondary | ICD-10-CM

## 2013-07-08 DIAGNOSIS — IMO0002 Reserved for concepts with insufficient information to code with codable children: Secondary | ICD-10-CM

## 2013-07-08 DIAGNOSIS — K2970 Gastritis, unspecified, without bleeding: Secondary | ICD-10-CM

## 2013-07-08 DIAGNOSIS — E782 Mixed hyperlipidemia: Secondary | ICD-10-CM

## 2013-07-08 DIAGNOSIS — M5126 Other intervertebral disc displacement, lumbar region: Secondary | ICD-10-CM

## 2013-07-08 MED ORDER — GABAPENTIN 300 MG PO CAPS: 300.0000 mg | ORAL_CAPSULE | Freq: Three times a day (TID) | ORAL | Status: DC

## 2013-07-08 NOTE — Progress Notes (Signed)
Subjective:    Patient ID: [name] [name], female    DOB: [birth year], 78 y.o.   MRN: [id number]  HPI Long Hx/ Chronic Lumbago predating from her original back surgery in 1989 and last back surg was in Feb 2014. Recently she has had a couple of hospitalizations for complications of slow recovery after repair of a giant intrathoracic Hiatal Hernia. She has had 2 EDSI's thru Dr Otho Ket office and after her most recent Lumbar MRI she has been referred to Dr Garen Grams for [facility]. Apparently she is intolerant to opiates and is taking Tramadol 100 mg and it doesn't hold her for a full 6 hr as it was prescribed.  She denies any sciatica.    Medication List       ALPRAZolam 0.5 MG tablet  Commonly known as:  XANAX  Take 0.5 mg by mouth 3 (three) times daily as needed for sleep or anxiety.     amLODipine 10 MG tablet  Commonly known as:  NORVASC  Take 10 mg by mouth every morning.     calcitonin (salmon) 200 UNIT/ACT nasal spray  Commonly known as:  MIACALCIN/FORTICAL  Place 1 spray into the nose every morning. She alternates nostrils every other day.     diazepam 5 MG tablet  Commonly known as:  VALIUM  Take 1 tablet (5 mg total) by mouth every 6 (six) hours as needed for anxiety.     gabapentin 300 MG capsule  Commonly known as:  NEURONTIN  Take 1 capsule (300 mg total) by mouth 3 (three) times daily.     losartan 100 MG tablet  Commonly known as:  COZAAR  TAKE ONE TABLET BY MOUTH EVERY DAY **DISCONTUNIE BENICAR**     metoCLOPramide 5 MG tablet  Commonly known as:  REGLAN  Take 1-2 tablets (5-10 mg total) by mouth 4 (four) times daily -  before meals and at bedtime.     metoprolol tartrate 25 MG tablet  Commonly known as:  LOPRESSOR  Take 12.5 mg by mouth at bedtime.     mometasone-formoterol 200-5 MCG/ACT Aero  Commonly known as:  DULERA  Inhale 2 puffs into the lungs 2 (two) times daily.     multivitamin with minerals Tabs tablet  Take 1 tablet by mouth every morning.      ondansetron 4 MG disintegrating tablet  Commonly known as:  ZOFRAN ODT  Take 1 tablet (4 mg total) by mouth every 8 (eight) hours as needed for nausea or vomiting.     OVER THE COUNTER MEDICATION  Take 1,000 mg by mouth every other day. Coral Calcium 1000mg      OVER THE COUNTER MEDICATION  Take 400 mg by mouth 3 (three) times daily. Magnesium Carbonate 400mg      pantoprazole 40 MG tablet  Commonly known as:  PROTONIX  Take 40 mg by mouth 2 (two) times daily.     polyethylene glycol powder powder  Commonly known as:  MIRALAX  Take 8.5-34 g by mouth 2 (two) times daily. To correct constipation.  Adjust dose over 1-2 months.  Goal = ~1 bowel movement / day     potassium chloride 10 MEQ tablet  Commonly known as:  K-DUR  Take 1 tablet (10 mEq total) by mouth 2 (two) times daily.     pravastatin 40 MG tablet  Commonly known as:  PRAVACHOL  Take 40 mg by mouth at bedtime.     promethazine 12.5 MG tablet  Commonly known as:  PHENERGAN  Take 1  tablet (12.5 mg total) by mouth every 6 (six) hours as needed for nausea or vomiting.     traMADol 50 MG tablet  Commonly known as:  ULTRAM  Take 1-2 tablets (50-100 mg total) by mouth every 6 (six) hours as needed for moderate pain or severe pain.     TUDORZA PRESSAIR 400 MCG/ACT Aepb  Generic drug:  Aclidinium Bromide  Inhale 1 puff into the lungs 2 (two) times daily.     VITAMIN B 12 PO  Take 1 tablet by mouth every morning.     Vitamin D3 2000 UNITS Tabs  Take 6,000 Units by mouth daily.        Allergies  Allergen Reactions  . Aspartame Diarrhea and Nausea And Vomiting  . Daliresp [Roflumilast] Nausea And Vomiting  . Hydrocodone Nausea Only  . Oxycodone Nausea Only  . Phenergan [Promethazine Hcl] Other (See Comments)    "Doesn't work"  . Ace Inhibitors Cough    Per Dr Melvyn Novas pulmonology 2013     Past Medical History  Diagnosis Date  . CAD (coronary artery disease)     LHC 2003 with 40% pLAD, 30% mLAD, 100% D1 (moderate  vessel), 100%  D2 (small vessel), 95% mid small OM2.  Pt had PTCA to D1;  D2 and OM2 small vessels and tx medically;  Last Myoview (2012): anterior infarct seen with scar, no ischemia. EF normal. Pt managed medically;  Lexiscan Myoview (12/14):  Low risk; ant scar with very mild peri-infarct ischemia; EF 55% with ant AK   . Recurrent aspiration bronchitis/pneumonia     PFTs 12/09 FVC 97%, FEV1 101%, ratio 77%, TLC 109%. No significant obstruction or restriction.   . Hypertension   . Hyperlipidemia   . Esophageal stricture   . Diverticulosis of colon   . Gastroesophageal reflux disease with hiatal hernia   . Iron deficiency anemia   . Gastritis   . GERD (gastroesophageal reflux disease)   . Hiatal hernia   . Asthma   . Blood transfusion   . Cough   . Wheezing   . Weakness   . Trouble swallowing   . Abdominal pain   . Hiatal hernia   . Shortness of breath   . Anxiety   . Complication of anesthesia   . PONV (postoperative nausea and vomiting)   . TIA (transient ischemic attack)   . BRONCHITIS, ACUTE 07/25/2007         . DIVERTICULOSIS, COLON 08/28/2008    Qualifier: Diagnosis of  By: [name], [name], [name]    . IRON DEFICIENCY ANEMIA, HX OF 07/24/2007    Qualifier: Diagnosis of  By: Tilden Dome       Review of Systems  Constitutional: Negative.   HENT: Negative.   Eyes: Negative.   Respiratory: Negative.   Cardiovascular: Negative.   Genitourinary: Negative.   Musculoskeletal: Positive for back pain. Negative for gait problem, myalgias, neck pain and neck stiffness.  Skin: Negative.  Negative for pallor and rash.  Neurological: Negative for dizziness, tremors, weakness, light-headedness, numbness and headaches.  Psychiatric/Behavioral: Negative.    BP: 126/64  Pulse: 76  Temp: 98.1 F (36.7 C)  Resp: 16    Objective:   Physical Exam  Constitutional: She is oriented to person, place, and time. She appears well-nourished. No distress.  HENT:  Nose: Nose normal.   Mouth/Throat: Oropharynx is clear and moist. No oropharyngeal exudate.  Eyes: EOM are normal. Pupils are equal, round, and reactive to light. No scleral icterus.  Neck:  Normal range of motion. Neck supple. No JVD present. No thyromegaly present.  Cardiovascular: Normal rate, regular rhythm and normal heart sounds.   No murmur heard. Pulmonary/Chest: Effort normal and breath sounds normal. No respiratory distress. She has no wheezes. She has no rales.  Abdominal: Soft. She exhibits no distension.  Musculoskeletal: Normal range of motion.  Tender para-lumbar spasm Bilat  Neurological: She is alert and oriented to person, place, and time. She has normal reflexes. No cranial nerve deficit. Coordination normal.  Negative SLR.  Skin: She is not diaphoretic.   Assessment & Plan:  1. Lumbago  - gabapentin (NEURONTIN) 300 MG capsule; Take 1 capsule (300 mg total) by mouth 3 (three) times daily.  Dispense: 90 capsule; Refill: 5. Given sedative precautions.  2. HNP (herniated nucleus pulposus), lumbar   3. Degeneration of intervertebral disc  Follow up in 3-4 weeks.

## 2013-07-08 NOTE — Patient Instructions (Signed)
Back Pain, Adult Low back pain is very common. About 1 in 5 people have back pain.The cause of low back pain is rarely dangerous. The pain often gets better over time.About half of people with a sudden onset of back pain feel better in just 2 weeks. About 8 in 10 people feel better by 6 weeks.  CAUSES Some common causes of back pain include:  Strain of the muscles or ligaments supporting the spine.  Wear and tear (degeneration) of the spinal discs.  Arthritis.  Direct injury to the back. DIAGNOSIS Most of the time, the direct cause of low back pain is not known.However, back pain can be treated effectively even when the exact cause of the pain is unknown.Answering your caregiver's questions about your overall health and symptoms is one of the most accurate ways to make sure the cause of your pain is not dangerous. If your caregiver needs more information, he or she may order lab work or imaging tests (X-rays or MRIs).However, even if imaging tests show changes in your back, this usually does not require surgery. HOME CARE INSTRUCTIONS For many people, back pain returns.Since low back pain is rarely dangerous, it is often a condition that people can learn to manageon their own.   Remain active. It is stressful on the back to sit or stand in one place. Do not sit, drive, or stand in one place for more than 30 minutes at a time. Take short walks on level surfaces as soon as pain allows.Try to increase the length of time you walk each day.  Do not stay in bed.Resting more than 1 or 2 days can delay your recovery.  Do not avoid exercise or work.Your body is made to move.It is not dangerous to be active, even though your back may hurt.Your back will likely heal faster if you return to being active before your pain is gone.  Pay attention to your body when you bend and lift. Many people have less discomfortwhen lifting if they bend their knees, keep the load close to their bodies,and  avoid twisting. Often, the most comfortable positions are those that put less stress on your recovering back.  Find a comfortable position to sleep. Use a firm mattress and lie on your side with your knees slightly bent. If you lie on your back, put a pillow under your knees.  Only take over-the-counter or prescription medicines as directed by your caregiver. Over-the-counter medicines to reduce pain and inflammation are often the most helpful.Your caregiver may prescribe muscle relaxant drugs.These medicines help dull your pain so you can more quickly return to your normal activities and healthy exercise.  Put ice on the injured area.  Put ice in a plastic bag.  Place a towel between your skin and the bag.  Leave the ice on for 15-20 minutes, 03-04 times a day for the first 2 to 3 days. After that, ice and heat may be alternated to reduce pain and spasms.  Ask your caregiver about trying back exercises and gentle massage. This may be of some benefit.  Avoid feeling anxious or stressed.Stress increases muscle tension and can worsen back pain.It is important to recognize when you are anxious or stressed and learn ways to manage it.Exercise is a great option. SEEK MEDICAL CARE IF:  You have pain that is not relieved with rest or medicine.  You have pain that does not improve in 1 week.  You have new symptoms.  You are generally not feeling well. SEEK   IMMEDIATE MEDICAL CARE IF:   You have pain that radiates from your back into your legs.  You develop new bowel or bladder control problems.  You have unusual weakness or numbness in your arms or legs.  You develop nausea or vomiting.  You develop abdominal pain.  You feel faint. Document Released: 04/25/2005 Document Revised: 10/25/2011 Document Reviewed: 09/13/2010 [employer] Patient Information 2014 [employer].  

## 2013-07-09 ENCOUNTER — Other Ambulatory Visit: Payer: Self-pay

## 2013-07-09 MED ORDER — OMEPRAZOLE 20 MG PO CPDR: 20.0000 mg | DELAYED_RELEASE_CAPSULE | Freq: Every day | ORAL | Status: DC

## 2013-07-11 ENCOUNTER — Ambulatory Visit (HOSPITAL_COMMUNITY)
Admission: RE | Admit: 2013-07-11 | Discharge: 2013-07-11 | Disposition: A | Discharge status: Home / Self Care | Payer: Medicare HMO | Source: Ambulatory Visit | Attending: Interventional Radiology | Admitting: Interventional Radiology

## 2013-07-11 DIAGNOSIS — M549 Dorsalgia, unspecified: Secondary | ICD-10-CM

## 2013-07-11 DIAGNOSIS — IMO0002 Reserved for concepts with insufficient information to code with codable children: Secondary | ICD-10-CM

## 2013-07-17 ENCOUNTER — Encounter (INDEPENDENT_AMBULATORY_CARE_PROVIDER_SITE_OTHER): Payer: Medicare HMO | Admitting: Surgery

## 2013-07-19 ENCOUNTER — Other Ambulatory Visit (HOSPITAL_COMMUNITY): Payer: Self-pay | Admitting: Interventional Radiology

## 2013-07-19 ENCOUNTER — Other Ambulatory Visit: Payer: Self-pay | Admitting: Internal Medicine

## 2013-07-19 DIAGNOSIS — IMO0002 Reserved for concepts with insufficient information to code with codable children: Secondary | ICD-10-CM

## 2013-07-19 DIAGNOSIS — M549 Dorsalgia, unspecified: Secondary | ICD-10-CM

## 2013-07-22 ENCOUNTER — Ambulatory Visit (INDEPENDENT_AMBULATORY_CARE_PROVIDER_SITE_OTHER): Payer: Medicare HMO | Admitting: Emergency Medicine

## 2013-07-22 ENCOUNTER — Encounter: Payer: Self-pay | Admitting: Emergency Medicine

## 2013-07-22 ENCOUNTER — Ambulatory Visit (INDEPENDENT_AMBULATORY_CARE_PROVIDER_SITE_OTHER): Payer: Medicare HMO | Admitting: Surgery

## 2013-07-22 VITALS — BP 116/58 | HR 74 | Temp 98.2°F | Resp 18 | Ht 63.75 in | Wt 130.0 lb

## 2013-07-22 VITALS — BP 122/78 | HR 72 | Temp 97.8°F | Resp 14 | Ht 65.0 in | Wt 130.0 lb

## 2013-07-22 DIAGNOSIS — B029 Zoster without complications: Secondary | ICD-10-CM

## 2013-07-22 DIAGNOSIS — R110 Nausea: Secondary | ICD-10-CM

## 2013-07-22 DIAGNOSIS — K440 Diaphragmatic hernia with obstruction, without gangrene: Secondary | ICD-10-CM

## 2013-07-22 DIAGNOSIS — R21 Rash and other nonspecific skin eruption: Secondary | ICD-10-CM

## 2013-07-22 MED ORDER — PREDNISONE 10 MG PO TABS: ORAL_TABLET | ORAL | Status: DC

## 2013-07-22 NOTE — Progress Notes (Signed)
Subjective:    Patient ID: [name] [name], female    DOB: [birth year], 78 y.o.   MRN: [id number]  HPI Comments: 78 yo had rash starting on Wed that has slowly increased over last several days. She denies pain with rash but also on gabapentin for pain prior to rash. She notes rash started as 2 dots and has spread on left posterior hip to several erythematous raised patches w/o blisters/ exudate. She has upcoming back surgery next Monday. She has ben recovering from recent stomach surgery/ issues and is starting to feel a little better.   Rash    Current Outpatient Prescriptions on File Prior to Visit  Medication Sig Dispense Refill  . Aclidinium Bromide (TUDORZA PRESSAIR) 400 MCG/ACT AEPB Inhale 1 puff into the lungs 2 (two) times daily.      [name] ALPRAZolam (XANAX) 0.5 MG tablet Take 0.5 mg by mouth 3 (three) times daily as needed for sleep or anxiety.       [name] amLODipine (NORVASC) 10 MG tablet Take 10 mg by mouth every morning.       . calcitonin, salmon, (MIACALCIN/FORTICAL) 200 UNIT/ACT nasal spray Place 1 spray into the nose every morning. She alternates nostrils every other day.      . Cholecalciferol (VITAMIN D3) 2000 UNITS TABS Take 6,000 Units by mouth daily.       . Cyanocobalamin (VITAMIN B 12 PO) Take 1 tablet by mouth every morning.       . diazepam (VALIUM) 5 MG tablet Take 1 tablet (5 mg total) by mouth every 6 (six) hours as needed for anxiety.  10 tablet  0  . gabapentin (NEURONTIN) 300 MG capsule Take 1 capsule (300 mg total) by mouth 3 (three) times daily.  90 capsule  5  . losartan (COZAAR) 100 MG tablet TAKE ONE TABLET BY MOUTH EVERY DAY **DISCONTUNIE BENICAR**  90 tablet  1  . metoCLOPramide (REGLAN) 10 MG tablet TAKE ONE HALF TO ONE TABLET BY MOUTH THREE TIMES DAILY BEFORE MEAL(S) FOR NAUSEA  90 tablet  0  . metoCLOPramide (REGLAN) 5 MG tablet Take 1-2 tablets (5-10 mg total) by mouth 4 (four) times daily -  before meals and at bedtime.  40 tablet  5  . metoprolol tartrate  (LOPRESSOR) 25 MG tablet Take 12.5 mg by mouth at bedtime.      . mometasone-formoterol (DULERA) 200-5 MCG/ACT AERO Inhale 2 puffs into the lungs 2 (two) times daily.  13 g  3  . Multiple Vitamin (MULTIVITAMIN WITH MINERALS) TABS Take 1 tablet by mouth every morning.       [name] omeprazole (PRILOSEC) 20 MG capsule Take 1 capsule (20 mg total) by mouth daily.  90 capsule  3  . ondansetron (ZOFRAN ODT) 4 MG disintegrating tablet Take 1 tablet (4 mg total) by mouth every 8 (eight) hours as needed for nausea or vomiting.  30 tablet  5  . OVER THE COUNTER MEDICATION Take 1,000 mg by mouth every other day. Coral Calcium 1000mg       . OVER THE COUNTER MEDICATION Take 400 mg by mouth 3 (three) times daily. Magnesium Carbonate 400mg       . pantoprazole (PROTONIX) 40 MG tablet Take 40 mg by mouth 2 (two) times daily.      . polyethylene glycol powder (MIRALAX) powder Take 8.5-34 g by mouth 2 (two) times daily. To correct constipation.  Adjust dose over 1-2 months.  Goal = ~1 bowel movement / day  255 g  5  .  potassium chloride (K-DUR) 10 MEQ tablet Take 1 tablet (10 mEq total) by mouth 2 (two) times daily.  60 tablet  1  . pravastatin (PRAVACHOL) 40 MG tablet Take 40 mg by mouth at bedtime.       . promethazine (PHENERGAN) 12.5 MG tablet Take 1 tablet (12.5 mg total) by mouth every 6 (six) hours as needed for nausea or vomiting.  20 tablet  5  . silver sulfADIAZINE (SILVADENE) 1 % cream Apply to affected area daily  50 g  1  . traMADol (ULTRAM) 50 MG tablet Take 1-2 tablets (50-100 mg total) by mouth every 6 (six) hours as needed for moderate pain or severe pain.  40 tablet  0   No current facility-administered medications on file prior to visit.   Allergies  Allergen Reactions  . Aspartame Diarrhea and Nausea And Vomiting  . Daliresp [Roflumilast] Nausea And Vomiting  . Hydrocodone Nausea Only  . Oxycodone Nausea Only  . Phenergan [Promethazine Hcl] Other (See Comments)    "Doesn't work"  . Ace  Inhibitors Cough    Per Dr Melvyn Novas pulmonology 2013   Past Medical History  Diagnosis Date  . CAD (coronary artery disease)     LHC 2003 with 40% pLAD, 30% mLAD, 100% D1 (moderate vessel), 100%  D2 (small vessel), 95% mid small OM2.  Pt had PTCA to D1;  D2 and OM2 small vessels and tx medically;  Last Myoview (2012): anterior infarct seen with scar, no ischemia. EF normal. Pt managed medically;  Lexiscan Myoview (12/14):  Low risk; ant scar with very mild peri-infarct ischemia; EF 55% with ant AK   . Recurrent aspiration bronchitis/pneumonia     PFTs 12/09 FVC 97%, FEV1 101%, ratio 77%, TLC 109%. No significant obstruction or restriction.   . Hypertension   . Hyperlipidemia   . Esophageal stricture   . Diverticulosis of colon   . Gastroesophageal reflux disease with hiatal hernia   . Iron deficiency anemia   . Gastritis   . GERD (gastroesophageal reflux disease)   . Hiatal hernia   . Asthma   . Blood transfusion   . Cough   . Wheezing   . Weakness   . Trouble swallowing   . Abdominal pain   . Hiatal hernia   . Shortness of breath   . Anxiety   . Complication of anesthesia   . PONV (postoperative nausea and vomiting)   . TIA (transient ischemic attack)   . BRONCHITIS, ACUTE 07/25/2007         . DIVERTICULOSIS, COLON 08/28/2008    Qualifier: Diagnosis of  By: [name], [name], [name]    . IRON DEFICIENCY ANEMIA, HX OF 07/24/2007    Qualifier: Diagnosis of  By: Tilden Dome       Review of Systems  Skin: Positive for rash.  All other systems reviewed and are negative.   BP 116/58  Pulse 74  Temp(Src) 98.2 F (36.8 C) (Temporal)  Resp 18  Ht 5' 3.75" (1.619 m)  Wt 130 lb (58.968 kg)  BMI 22.50 kg/m2     Objective:   Physical Exam  Nursing note and vitals reviewed. Constitutional: She is oriented to person, place, and time. She appears well-developed and well-nourished.  HENT:  Head: Normocephalic and atraumatic.  Eyes: Conjunctivae are normal.  Neck: Normal range of  motion.  Cardiovascular: Normal rate, regular rhythm, normal heart sounds and intact distal pulses.   Pulmonary/Chest: Effort normal and breath sounds normal.  Abdominal: Soft. Bowel sounds  are normal. She exhibits no distension and no mass. There is no tenderness. There is no rebound and no guarding.  Musculoskeletal: Normal range of motion.  Neurological: She is alert and oriented to person, place, and time.  Skin: Skin is warm and dry. Rash noted. There is erythema.     Psychiatric: She has a normal mood and affect. Judgment normal.          Assessment & Plan:  Patient Seen by Dr Melford Aase also at this visit.  Shingles- Hygiene advised, Pred dp 10 mg AD, continue to keep area clean and covered with vaseline topically, w/c if SX increase or ER. Advise postpone back surgery at least 2-4 weeks.

## 2013-07-22 NOTE — Patient Instructions (Signed)
Shingles  Shingles is caused by the same virus that causes chickenpox. The first feelings may be pain or tingling. A rash will follow in a couple days. The rash may occur on any area of the body. Long-lasting pain is more likely in an elderly person. It can last months to years. There are medicines that can help prevent pain if you start taking them early.  HOME CARE    Place cool cloths on the rash.   Only take medicine as told by your doctor.   You may use calamine lotion to relieve itchy skin.   Avoid touching:   Babies.   Children with inflamed skin (eczema).   People who have gotten transplanted organs.   People with chronic illnesses, such as leukemia and AIDS.   If the rash is on the face, you may need to see a specialist. Keep all appointments. Shingles must be kept away from the eyes, if possible.   Keep all appointments.   Avoid touching the eyes or eye area, if possible.  GET HELP RIGHT AWAY IF:    You have any pain on the face or eye.   Your medicines do not help.   Your redness or puffiness (swelling) spreads.   You have a fever.   You notice any red lines going away from the rash area.  MAKE SURE YOU:    Understand these instructions.   Will watch your condition.   Will get help right away if you are not doing well or get worse.  Document Released: 10/12/2007 Document Revised: 07/18/2011 Document Reviewed: 10/12/2007  [employer] Patient Information 2014 [employer].

## 2013-07-22 NOTE — Patient Instructions (Signed)
EATING AFTER YOUR ESOPHAGEAL SURGERY (Stomach Fundoplication, Hiatal Hernia repair, Achalasia surgery, etc)  After your esophageal surgery, expect some sticking with swallowing over the next 1-2 months.    If food sticks when you eat, it is called "dysphagia".  This is due to swelling around your esophagus at the wrap & hiatal diaphragm repair.  It will gradually ease off over the next few months.  To help you through this temporary phase, we start you out on a pureed (blenderized) diet.  Your first meal in the hospital was thin liquids.  You should have been given a pureed diet by the time you left the hospital.  We ask patients to stay on a pureed diet for the first 2-3 weeks to avoid anything getting "stuck" near your recent surgery.  Don't be alarmed if your ability to swallow doesn't progress according to this plan.  Everyone is different and some diets can advance more or less quickly.     Some BASIC RULES to follow are:  Maintain an upright position whenever eating or drinking.  Take small bites - just a teaspoon size bite at a time.  Eat slowly.  It may also help to eat only one food at a time.  Consider nibbling through smaller, more frequent meals & avoid the urge to eat BIG meals  Do not push through feelings of fullness, nausea, or bloatedness  Do not mix solid foods and liquids in the same mouthful  Try not to "wash foods down" with large gulps of liquids. Avoid carbonated (bubbly/fizzy) drinks.  Understand that it will be hard to burp and belch at first.  This gradually improves with time.  Expect to be more gassy/flatulent/bloated initially.  Walking will help you work through that.  Maalox/Gas-X can help as well.  Eat in a relaxed atmosphere & minimize distractions.  Avoid talking while eating.    Do not use straws.  Following each meal, sit in an upright position (90 degree angle) for 60 to 90 minutes.  Going for a short walk can help as well  If food does stick,  don't panic.  Try to relax and let the food pass on its own.  Sipping WARM LIQUID such as strong hot black tea can also help slide it down.   Be gradual in changes & use common sense:  -If you easily tolerating a certain "level" of foods, advance to the next level gradually -If you are having trouble swallowing a particular food, then avoid it.   -If food is sticking when you advance your diet, go back to thinner previous diet (the lower LEVEL) for 1-2 days.  LEVEL 1 = PUREED DIET  Do for the first 2 WEEKS AFTER SURGERY  -Foods in this group are pureed or blenderized to a smooth, mashed potato-like consistency.  -If necessary, the pureed foods can keep their shape with the addition of a thickening agent.   -Meat should be pureed to a smooth, pasty consistency.  Hot broth or gravy may be added to the pureed meat, approximately 1 oz. of liquid per 3 oz. serving of meat. -CAUTION:  If any foods do not puree into a smooth consistency, swallowing will be more difficult.  (For example, nuts or seeds sometimes do not blend well.)  Hot Foods Cold Foods  Pureed scrambled eggs and cheese Pureed cottage cheese  Baby cereals Thickened juices and nectars  Thinned cooked cereals (no lumps) Thickened milk or eggnog  Pureed [place] toast or pancakes Ensure  Mashed  potatoes Ice cream  Pureed parsley, au gratin, scalloped potatoes, candied sweet potatoes Fruit or [place] ice, sherbet  Pureed buttered or alfredo noodles Plain yogurt  Pureed vegetables (no corn or peas) Instant breakfast  Pureed soups and creamed soups Smooth pudding, mousse, custard  Pureed scalloped apples Whipped gelatin  Gravies Sugar, syrup, honey, jelly  Sauces, cheese, tomato, barbecue, white, creamed Cream  Any baby food Creamer  Alcohol in moderation (not beer or champagne) Margarine  Coffee or tea Mayonnaise   Ketchup, mustard   Apple sauce   SAMPLE MENU:  PUREED DIET Breakfast Lunch Dinner   Orange juice, 1/2  cup  Cream of wheat, 1/2 cup  Pineapple juice, 1/2 cup  Pureed [place], barley soup, 3/4 cup  Pureed Hawaiian chicken, 3 oz   Scrambled eggs, mashed or blended with cheese, 1/2 cup  Tea or coffee, 1 cup   Whole milk, 1 cup   Non-dairy creamer, 2 Tbsp.  Mashed potatoes, 1/2 cup  Pureed cooled broccoli, 1/2 cup  Apple sauce, 1/2 cup  Coffee or tea  Mashed potatoes, 1/2 cup  Pureed spinach, 1/2 cup  Frozen yogurt, 1/2 cup  Tea or coffee      LEVEL 2 = SOFT DIET  After your first 2 weeks, you can advance to a soft diet.   Keep on this diet until everything goes down easily.  Hot Foods Cold Foods  White fish Cottage cheese  Stuffed fish Junior baby fruit  Baby food meals Semi thickened juices  Minced soft cooked, scrambled, poached eggs nectars  Souffle & omelets Ripe mashed bananas  Cooked cereals Canned fruit, pineapple sauce, milk  potatoes Milkshake  Buttered or Alfredo noodles Custard  Cooked cooled vegetable Puddings, including tapioca  Sherbet Yogurt  Vegetable soup or alphabet soup Fruit ice, [place] ice  Gravies Whipped gelatin  Sugar, syrup, honey, jelly Junior baby desserts  Sauces:  Cheese, creamed, barbecue, tomato, white Cream  Coffee or tea Margarine   SAMPLE MENU:  LEVEL 2 Breakfast Lunch Dinner   Orange juice, 1/2 cup  Oatmeal, 1/2 cup  Scrambled eggs with cheese, 1/2 cup  Decaffeinated tea, 1 cup  Whole milk, 1 cup  Non-dairy creamer, 2 Tbsp  Pineapple juice, 1/2 cup  Minced beef, 3 oz  Gravy, 2 Tbsp  Mashed potatoes, 1/2 cup  Minced fresh broccoli, 1/2 cup  Applesauce, 1/2 cup  Coffee, 1 cup  [place], barley soup, 3/4 cup  Minced Hawaiian chicken, 3 oz  Mashed potatoes, 1/2 cup  Cooked spinach, 1/2 cup  Frozen yogurt, 1/2 cup  Non-dairy creamer, 2 Tbsp      LEVEL 3 = CHOPPED DIET  -After all the foods in level 2 (soft diet) are passing through well you should advance up to more chopped foods.  -It is still  important to cut these foods into small pieces and eat slowly.  Hot Foods Cold Foods  Poultry Cottage cheese  Chopped Swedish meatballs Yogurt  Meat salads (ground or flaked meat) Milk  Flaked fish (tuna) Milkshakes  Poached or scrambled eggs Soft, cold, dry cereal  Souffles and omelets Fruit juices or nectars  Cooked cereals Chopped canned fruit  Chopped [place] toast or pancakes Canned fruit cocktail  Noodles or pasta (no rice) Pudding, mousse, custard  Cooked vegetables (no frozen peas, corn, or mixed vegetables) Green salad  Canned small sweet peas Ice cream  Creamed soup or vegetable soup Fruit ice, [place] ice  Pureed vegetable soup or alphabet soup Non-dairy creamer  Ground scalloped  apples Margarine  Gravies Mayonnaise  Sauces:  Cheese, creamed, barbecue, tomato, white Ketchup  Coffee or tea Mustard   SAMPLE MENU:  LEVEL 3 Breakfast Lunch Dinner   Orange juice, 1/2 cup  Oatmeal, 1/2 cup  Scrambled eggs with cheese, 1/2 cup  Decaffeinated tea, 1 cup  Whole milk, 1 cup  Non-dairy creamer, 2 Tbsp  Ketchup, 1 Tbsp  Margarine, 1 tsp  Salt, 1/4 tsp  Sugar, 2 tsp  Pineapple juice, 1/2 cup  Ground beef, 3 oz  Gravy, 2 Tbsp  Mashed potatoes, 1/2 cup  Cooked spinach, 1/2 cup  Applesauce, 1/2 cup  Decaffeinated coffee  Whole milk  Non-dairy creamer, 2 Tbsp  Margarine, 1 tsp  Salt, 1/4 tsp  Pureed [place], barley soup, 3/4 cup  Barbecue chicken, 3 oz  Mashed potatoes, 1/2 cup  Ground fresh broccoli, 1/2 cup  Frozen yogurt, 1/2 cup  Decaffeinated tea, 1 cup  Non-dairy creamer, 2 Tbsp  Margarine, 1 tsp  Salt, 1/4 tsp  Sugar, 1 tsp    LEVEL 4:  REGULAR FOODS  -Foods in this group are soft, moist, regularly textured foods.   -This level includes meat and breads, which tend to be the hardest things to swallow.   -Eat very slowly, chew well and continue to avoid carbonated drinks. -most people are at this level in 4-6 weeks  Hot Foods  Cold Foods  Baked fish or skinned Soft cheeses - cottage cheese  Souffles and omelets Cream cheese  Eggs Yogurt  Stuffed shells Milk  Spaghetti with meat sauce Milkshakes  Cooked cereal Cold dry cereals (no nuts, dried fruit, coconut)  [place] toast or pancakes Crackers  Buttered toast Fruit juices or nectars  Noodles or pasta (no rice) Canned fruit  Potatoes (all types) Ripe bananas  Soft, cooked vegetables (no corn, lima, or baked beans) Peeled, ripe, fresh fruit  Creamed soups or vegetable soup Cakes (no nuts, dried fruit, coconut)  Canned chicken noodle soup Plain doughnuts  Gravies Ice cream  Bacon dressing Pudding, mousse, custard  Sauces:  Cheese, creamed, barbecue, tomato, white Fruit ice, [place] ice, sherbet  Decaffeinated tea or coffee Whipped gelatin  Pork chops Regular gelatin   Canned fruited gelatin molds   Sugar, syrup, honey, jam, jelly   Cream   Non-dairy   Margarine   Oil   Mayonnaise   Ketchup   Mustard    If you have any questions please call our office at [place]: [phone].  Nausea, Adult Nausea is the feeling that you have an upset stomach or have to vomit. Nausea by itself is not likely a serious concern, but it may be an early sign of more serious medical problems. As nausea gets worse, it can lead to vomiting. If vomiting develops, there is the risk of dehydration.  CAUSES   Viral infections.  Food poisoning.  Medicines.  Pregnancy.  Motion sickness.  Migraine headaches.  Emotional distress.  Severe pain from any source.  Alcohol intoxication. HOME CARE INSTRUCTIONS  Get plenty of rest.  Ask your caregiver about specific rehydration instructions.  Eat small amounts of food and sip liquids more often.  Take all medicines as told by your caregiver. SEEK MEDICAL CARE IF:  You have not improved after 2 days, or you get worse.  You have a headache. SEEK IMMEDIATE MEDICAL CARE IF:   You have a fever.  You  faint.  You keep vomiting or have blood in your vomit.  You are extremely weak or dehydrated.  You have dark or bloody stools.  You have severe chest or abdominal pain. MAKE SURE YOU:  Understand these instructions.  Will watch your condition.  Will get help right away if you are not doing well or get worse. Document Released: 06/02/2004 Document Revised: 01/18/2012 Document Reviewed: 01/05/2011 [facility] Patient Information 2014 [place], [place].  Shingles Shingles (herpes zoster) is an infection that is caused by the same virus that causes chickenpox (varicella). The infection causes a painful skin rash and fluid-filled blisters, which eventually break open, crust over, and heal. It may occur in any area of the body, but it usually affects only one side of the body or face. The pain of shingles usually lasts about 1 month. However, some people with shingles may develop long-term (chronic) pain in the affected area of the body. Shingles often occurs many years after the person had chickenpox. It is more common:  In people older than 50 years.  In people with weakened immune systems, such as those with HIV, AIDS, or cancer.  In people taking medicines that weaken the immune system, such as transplant medicines.  In people under great stress. CAUSES  Shingles is caused by the varicella zoster virus (VZV), which also causes chickenpox. After a person is infected with the virus, it can remain in the person's body for years in an inactive state (dormant). To cause shingles, the virus reactivates and breaks out as an infection in a nerve root. The virus can be spread from person to person (contagious) through contact with open blisters of the shingles rash. It will only spread to people who have not had chickenpox. When these people are exposed to the virus, they may develop chickenpox. They will not develop shingles. Once the blisters scab over, the person is no longer contagious and cannot  spread the virus to others. SYMPTOMS  Shingles shows up in stages. The initial symptoms may be pain, itching, and tingling in an area of the skin. This pain is usually described as burning, stabbing, or throbbing.In a few days or weeks, a painful red rash will appear in the area where the pain, itching, and tingling were felt. The rash is usually on one side of the body in a band or belt-like pattern. Then, the rash usually turns into fluid-filled blisters. They will scab over and dry up in approximately 2 3 weeks. Flu-like symptoms may also occur with the initial symptoms, the rash, or the blisters. These may include:  Fever.  Chills.  Headache.  Upset stomach. DIAGNOSIS  Your caregiver will perform a skin exam to diagnose shingles. Skin scrapings or fluid samples may also be taken from the blisters. This sample will be examined under a microscope or sent to a lab for further testing. TREATMENT  There is no specific cure for shingles. Your caregiver will likely prescribe medicines to help you manage the pain, recover faster, and avoid long-term problems. This may include antiviral drugs, anti-inflammatory drugs, and pain medicines. HOME CARE INSTRUCTIONS   Take a cool bath or apply cool compresses to the area of the rash or blisters as directed. This may help with the pain and itching.   Only take over-the-counter or prescription medicines as directed by your caregiver.   Rest as directed by your caregiver.  Keep your rash and blisters clean with mild soap and cool water or as directed by your caregiver.  Do not pick your blisters or scratch your rash. Apply an anti-itch cream or numbing creams to the affected area  as directed by your caregiver.  Keep your shingles rash covered with a loose bandage (dressing).  Avoid skin contact with:  Babies.   Pregnant women.   Children with eczema.   Elderly people with transplants.   People with chronic illnesses, such as  leukemia or AIDS.   Wear loose-fitting clothing to help ease the pain of material rubbing against the rash.  Keep all follow-up appointments with your caregiver.If the area involved is on your face, you may receive a referral for follow-up to a specialist, such as an eye doctor (ophthalmologist) or an ear, nose, and throat (ENT) doctor. Keeping all follow-up appointments will help you avoid eye complications, chronic pain, or disability.  SEEK IMMEDIATE MEDICAL CARE IF:   You have facial pain, pain around the eye area, or loss of feeling on one side of your face.  You have ear pain or ringing in your ear.  You have loss of taste.  Your pain is not relieved with prescribed medicines.   Your redness or swelling spreads.   You have more pain and swelling.  Your condition is worsening or has changed.   You have a feveror persistent symptoms for more than 2 3 days.  You have a fever and your symptoms suddenly get worse. MAKE SURE YOU:  Understand these instructions.  Will watch your condition.  Will get help right away if you are not doing well or get worse. Document Released: 04/25/2005 Document Revised: 01/18/2012 Document Reviewed: 12/08/2011 [facility] Patient Information 2014 [place].

## 2013-07-22 NOTE — Progress Notes (Addendum)
Subjective:     Patient ID: [name] [name], female   DOB: March 19, 1936, 78 y.o.   MRN: [id number]  HPI   Note: This dictation was prepared with Dragon/digital dictation along with [facility] technology. Any transcriptional errors that result from this process are unintentional.       Kitanna Urness  1935-09-28 [id number]  Patient Care Team: [name], MD as PCP - General (Internal Medicine) Inda Castle, MD as Consulting Physician (Gastroenterology) Rigoberto Noel, MD as Consulting Physician (Pulmonary Disease) Larey Dresser, MD as Consulting Physician (Cardiology) Adin Hector, MD as Consulting Physician (General Surgery) Jessy Oto, MD as Consulting Physician (Orthopedic Surgery)  Procedure (Date: 04/24/2013):  POST-OPERATIVE DIAGNOSIS: RECURRENT Paraesophageal hiatal hernia refractory to medical management   PROCEDURE: Procedure(s):  LAPAROSCOPIC LYSIS OF ADHESIONS x 2hours (1/2 case)  Type II mediastinal dissection.  Laparoscopic reduction of paraesophageal hiatal hernia  Primary repair of hiatal hernia over pledgets.  INSERTION OF MESH UltraPro over crural closure and biologic over the esophageal hiatus  Anterior & posterior gastropexy.  Nissen fundoplication 2cm over a [id number] bougie   SURGEON: Surgeon(s):  Adin Hector, MD   This patient returns for surgical re-evaluation.  She comes in with her husband.  They both feel that she is deftly getting better.  Eating better.  Tolerating solids well.  Primarily doing soft foods.  Energy level better.  Appetite better better.  Nausea much less.  Only needing Reglan occasionally, no longer with every meal.  No renal dysphagia to solids or liquids.  No problem with belching.  No hyperflatulence.  No constipation.  She has seen her back Psychologist, sport and exercise.  There is discussion about surgery next month to help with that.  She is hopeful that we will help as well.   No fevers or chills.  Walking with a walker for balance  only.  The end of the visit, the patient's husband pointed out a rash on her left posterior back.  It came up last week.  Not painful.  Not draining.  No itching.  No problems elsewhere.  No contact or falls to that area.  No change in soaps or laundry detergent.  Due to see their primary care physician soon.    Patient Active Problem List   Diagnosis Date Noted  . Rash of left inferior back - ?Shingles? 07/22/2013  . Subacute confusional state 06/26/2013  . Dysphagia 05/15/2013  . Nausea 05/13/2013  . Protein-calorie malnutrition, severe 04/25/2013  . Constipation, chronic 04/03/2013  . Anxiety   . MGUS (monoclonal gammopathy of unknown significance) 03/05/2013  . Herniated lumbar intervertebral disc 06/25/2012    Class: Acute  . Vertebral compression fracture 06/23/2012  . GASTRITIS 05/13/2010  . CAD, UNSPECIFIED SITE 08/28/2008  . ESOPHAGEAL STRICTURE 08/28/2008  . ASTHMA 07/29/2008  . Incarcerated recurrent paraesophageal hiatal hernia s/p lap redo repair [id number] 01/31/2008  . HYPERTENSION 07/25/2007  . HYPERLIPIDEMIA 07/24/2007    Past Medical History  Diagnosis Date  . CAD (coronary artery disease)     LHC 2003 with 40% pLAD, 30% mLAD, 100% D1 (moderate vessel), 100%  D2 (small vessel), 95% mid small OM2.  Pt had PTCA to D1;  D2 and OM2 small vessels and tx medically;  Last Myoview (2012): anterior infarct seen with scar, no ischemia. EF normal. Pt managed medically;  Lexiscan Myoview (12/14):  Low risk; ant scar with very mild peri-infarct ischemia; EF 55% with ant AK   . Recurrent aspiration bronchitis/pneumonia     PFTs 12/09  FVC 97%, FEV1 101%, ratio 77%, TLC 109%. No significant obstruction or restriction.   . Hypertension   . Hyperlipidemia   . Esophageal stricture   . Diverticulosis of colon   . Gastroesophageal reflux disease with hiatal hernia   . Iron deficiency anemia   . Gastritis   . GERD (gastroesophageal reflux disease)   . Hiatal hernia   . Asthma   .  Blood transfusion   . Cough   . Wheezing   . Weakness   . Trouble swallowing   . Abdominal pain   . Hiatal hernia   . Shortness of breath   . Anxiety   . Complication of anesthesia   . PONV (postoperative nausea and vomiting)   . TIA (transient ischemic attack)   . BRONCHITIS, ACUTE 07/25/2007         . DIVERTICULOSIS, COLON 08/28/2008    Qualifier: Diagnosis of  By: Bascom Levels, [name], [name]    . IRON DEFICIENCY ANEMIA, HX OF 07/24/2007    Qualifier: Diagnosis of  By: Vernie Murders      Past Surgical History  Procedure Laterality Date  . Back surgery    . Cholecystectomy    . Bladder repair    . Hemorrhoid surgery    . Cholecystectomy      Patient unsure of date.  [name] Appendectomy      patient unsure of date  . Total abdominal hysterectomy  1975    partial  . Coronary angioplasty  11/16/2001  . Eye surgery  11/2000    bilateral cataracts with lens implant  . Hiatal hernia repair  06/03/2011    Procedure: LAPAROSCOPIC REPAIR OF HIATAL HERNIA;  Surgeon: Ernestene Mention, MD;  Location: [facility] ORS;  Service: General;  Laterality: N/A;  . Laparoscopic nissen fundoplication  06/03/2011    Procedure: LAPAROSCOPIC NISSEN FUNDOPLICATION;  Surgeon: Ernestene Mention, MD;  Location: [facility];  Service: General;  Laterality: N/A;  . Lumbar laminectomy/decompression microdiscectomy Right 06/26/2012    Procedure: LUMBAR LAMINECTOMY/DECOMPRESSION MICRODISCECTOMY;  Surgeon: Kerrin Champagne, MD;  Location[facility];  Service: Orthopedics;  Laterality: Right;  RIGHT L4-5 MICRODISCECTOMY  . Esophagogastroduodenoscopy N/A 03/15/2013    Procedure: ESOPHAGOGASTRODUODENOSCOPY (EGD);  Surgeon: Beverley Fiedler, MD;  Location: [facility] ENDOSCOPY;  Service: Endoscopy;  Laterality: N/A;  . Esophageal manometry N/A 03/25/2013    Procedure: ESOPHAGEAL MANOMETRY (EM);  Surgeon: Louis Meckel, MD;  Location: [facility] ENDOSCOPY;  Service: Endoscopy;  Laterality: N/A;  . Hiatal hernia repair N/A 04/24/2013    Procedure: LAPAROSCOPIC   REPAIR RECURRENT PARASOPHAGEAL HIATAL HERNIA WITH FUNDOPLICATION;  Surgeon: Ardeth Sportsman, MD;  Location: [facility];  Service: General;  Laterality: N/A;  . Insertion of mesh N/A 04/24/2013    Procedure: INSERTION OF MESH;  Surgeon: Ardeth Sportsman, MD;  Location: [facility];  Service: General;  Laterality: N/A;  . Laparoscopic lysis of adhesions N/A 04/24/2013    Procedure: LAPAROSCOPIC LYSIS OF ADHESIONS;  Surgeon: Ardeth Sportsman, MD;  Location: [facility];  Service: General;  Laterality: N/A;    History   Social History  . Marital Status: Married    Spouse Name: N/A    Number of Children: N/A  . Years of Education: N/A   Occupational History  . Retired     worked at [employer] History Main Topics  . Smoking status: Never Smoker   . Smokeless tobacco: Never Used  . Alcohol Use: No  . Drug Use: No  . Sexual  Activity: No   Other Topics Concern  . Not on file   Social History Narrative   Lives with husband.    Family History  Problem Relation Age of Onset  . Cancer Mother     colon   . Colon cancer Mother 69  . Heart disease Father 26    heart attack    Current Outpatient Prescriptions  Medication Sig Dispense Refill  . Aclidinium Bromide (TUDORZA PRESSAIR) 400 MCG/ACT AEPB Inhale 1 puff into the lungs 2 (two) times daily.      [name] ALPRAZolam (XANAX) 0.5 MG tablet Take 0.5 mg by mouth 3 (three) times daily as needed for sleep or anxiety.       [name] amLODipine (NORVASC) 10 MG tablet Take 10 mg by mouth every morning.       . calcitonin, salmon, (MIACALCIN/FORTICAL) 200 UNIT/ACT nasal spray Place 1 spray into the nose every morning. She alternates nostrils every other day.      . Cholecalciferol (VITAMIN D3) 2000 UNITS TABS Take 6,000 Units by mouth daily.       . Cyanocobalamin (VITAMIN B 12 PO) Take 1 tablet by mouth every morning.       . diazepam (VALIUM) 5 MG tablet Take 1 tablet (5 mg total) by mouth every 6 (six) hours as needed for anxiety.  10 tablet  0  .  gabapentin (NEURONTIN) 300 MG capsule Take 1 capsule (300 mg total) by mouth 3 (three) times daily.  90 capsule  5  . losartan (COZAAR) 100 MG tablet TAKE ONE TABLET BY MOUTH EVERY DAY **DISCONTUNIE BENICAR**  90 tablet  1  . metoCLOPramide (REGLAN) 10 MG tablet TAKE ONE HALF TO ONE TABLET BY MOUTH THREE TIMES DAILY BEFORE MEAL(S) FOR NAUSEA  90 tablet  0  . metoCLOPramide (REGLAN) 5 MG tablet Take 1-2 tablets (5-10 mg total) by mouth 4 (four) times daily -  before meals and at bedtime.  40 tablet  5  . metoprolol tartrate (LOPRESSOR) 25 MG tablet Take 12.5 mg by mouth at bedtime.      . mometasone-formoterol (DULERA) 200-5 MCG/ACT AERO Inhale 2 puffs into the lungs 2 (two) times daily.  13 g  3  . Multiple Vitamin (MULTIVITAMIN WITH MINERALS) TABS Take 1 tablet by mouth every morning.       [name] omeprazole (PRILOSEC) 20 MG capsule Take 1 capsule (20 mg total) by mouth daily.  90 capsule  3  . ondansetron (ZOFRAN ODT) 4 MG disintegrating tablet Take 1 tablet (4 mg total) by mouth every 8 (eight) hours as needed for nausea or vomiting.  30 tablet  5  . OVER THE COUNTER MEDICATION Take 1,000 mg by mouth every other day. Coral Calcium 1000mg       . OVER THE COUNTER MEDICATION Take 400 mg by mouth 3 (three) times daily. Magnesium Carbonate 400mg       . pantoprazole (PROTONIX) 40 MG tablet Take 40 mg by mouth 2 (two) times daily.      . polyethylene glycol powder (MIRALAX) powder Take 8.5-34 g by mouth 2 (two) times daily. To correct constipation.  Adjust dose over 1-2 months.  Goal = ~1 bowel movement / day  255 g  5  . potassium chloride (K-DUR) 10 MEQ tablet Take 1 tablet (10 mEq total) by mouth 2 (two) times daily.  60 tablet  1  . pravastatin (PRAVACHOL) 40 MG tablet Take 40 mg by mouth at bedtime.       . promethazine (PHENERGAN)  12.5 MG tablet Take 1 tablet (12.5 mg total) by mouth every 6 (six) hours as needed for nausea or vomiting.  20 tablet  5  . silver sulfADIAZINE (SILVADENE) 1 % cream Apply to  affected area daily  50 g  1  . traMADol (ULTRAM) 50 MG tablet Take 1-2 tablets (50-100 mg total) by mouth every 6 (six) hours as needed for moderate pain or severe pain.  40 tablet  0   No current facility-administered medications for this visit.     Allergies  Allergen Reactions  . Aspartame Diarrhea and Nausea And Vomiting  . Daliresp [Roflumilast] Nausea And Vomiting  . Hydrocodone Nausea Only  . Oxycodone Nausea Only  . Phenergan [Promethazine Hcl] Other (See Comments)    "Doesn't work"  . Ace Inhibitors Cough    Per Dr Melvyn Novas pulmonology 2013    BP 122/78  Pulse 72  Temp(Src) 97.8 F (36.6 C) (Oral)  Resp 14  Ht 5\' 5"  (1.651 m)  Wt 130 lb (58.968 kg)  BMI 21.63 kg/m2  Ct Chest W Contrast  05/13/2013   CLINICAL DATA:  Nausea and abdominal pain. Hiatal hernia repair 3 weeks prior. Rule out abscess.  EXAM: CT CHEST, ABDOMEN, AND PELVIS WITH CONTRAST  TECHNIQUE: Multidetector CT imaging of the chest, abdomen and pelvis was performed following the standard protocol during bolus administration of intravenous contrast.  CONTRAST:  133mL OMNIPAQUE IOHEXOL 300 MG/ML  SOLN  COMPARISON:  Chest CT 03/12/2013.  FINDINGS: CT CHEST FINDINGS  THORACIC INLET/BODY WALL:  No acute abnormality.  MEDIASTINUM:  Normal heart size. No pericardial effusion. Diffuse atherosclerosis, including the coronary arteries. There is suggestion of subendocardial fibro fatty scarring along the LAD distribution, compatible with remote infarct. No evidence of acute vascular abnormality.  Interval repair of recurrent hiatal hernia. There is a tight Nissen wrap with extra luminal high-density material consistent with pledgets (rather than extra luminal contrast). The distal esophagus and gastric wrap is thickened. There is fluid accumulating around the posterior and right lateral distal esophagus measuring approximately 2 x 1 x 1.5 cm. No well-defined enhancing wall or surrounding fat infiltration.  LUNG WINDOWS:  No  consolidation.  No effusion.  No suspicious pulmonary nodule.  OSSEOUS:  No acute fracture.  No suspicious lytic or blastic lesions.  CT ABDOMEN AND PELVIS FINDINGS  BODY WALL: Pelvic floor laxity with rectal prolapse.  Liver: Atrophy of the lateral segment left lobe liver, with intrahepatic biliary ductal dilatation.  Biliary: Cholecystectomy.  Chronic, diffuse biliary ductal dilation.  Pancreas: Unremarkable.  Spleen: No focal abnormality.  Adrenals: Unremarkable.  Kidneys and ureters: No hydronephrosis or stone. Tiny presumed cyst in the interpolar left kidney.  Bladder: Unremarkable.  Reproductive: Hysterectomy.  Bowel: No obstruction.  Retroperitoneum: No mass or adenopathy.  Peritoneum: No free fluid or gas.  Vascular: No acute abnormality.  OSSEOUS: Unchanged linear sclerosis in the left more than right sacral ala, consistent with previous insufficiency fractures. Remote L1 and L3 compression fractures with unchanged height loss greater than 50%. Remote 12 superior endplate fracture. L4-5 anterolisthesis related to advanced facet osteoarthritis. No acute fracture seen  IMPRESSION: 1. Distal esophageal thickening/inflammation status post recent hiatal hernia repair with Nissen fundoplication. There is a 2 x 1 x 1.5 cm fluid collection along the lower right esophagus which is likely incidental/postoperative, but is sterility indeterminate by imaging. No evidence of esophageal leak. 2. Chronic intra-abdominal and intrathoracic findings are stable from recent CT comparisons, described above.   Electronically Signed   By:  Jorje Guild M.D.   On: 05/13/2013 07:13   Ct Abdomen Pelvis W Contrast  05/13/2013   CLINICAL DATA:  Nausea and abdominal pain. Hiatal hernia repair 3 weeks prior. Rule out abscess.  EXAM: CT CHEST, ABDOMEN, AND PELVIS WITH CONTRAST  TECHNIQUE: Multidetector CT imaging of the chest, abdomen and pelvis was performed following the standard protocol during bolus administration of intravenous  contrast.  CONTRAST:  145mL OMNIPAQUE IOHEXOL 300 MG/ML  SOLN  COMPARISON:  Chest CT 03/12/2013.  FINDINGS: CT CHEST FINDINGS  THORACIC INLET/BODY WALL:  No acute abnormality.  MEDIASTINUM:  Normal heart size. No pericardial effusion. Diffuse atherosclerosis, including the coronary arteries. There is suggestion of subendocardial fibro fatty scarring along the LAD distribution, compatible with remote infarct. No evidence of acute vascular abnormality.  Interval repair of recurrent hiatal hernia. There is a tight Nissen wrap with extra luminal high-density material consistent with pledgets (rather than extra luminal contrast). The distal esophagus and gastric wrap is thickened. There is fluid accumulating around the posterior and right lateral distal esophagus measuring approximately 2 x 1 x 1.5 cm. No well-defined enhancing wall or surrounding fat infiltration.  LUNG WINDOWS:  No consolidation.  No effusion.  No suspicious pulmonary nodule.  OSSEOUS:  No acute fracture.  No suspicious lytic or blastic lesions.  CT ABDOMEN AND PELVIS FINDINGS  BODY WALL: Pelvic floor laxity with rectal prolapse.  Liver: Atrophy of the lateral segment left lobe liver, with intrahepatic biliary ductal dilatation.  Biliary: Cholecystectomy.  Chronic, diffuse biliary ductal dilation.  Pancreas: Unremarkable.  Spleen: No focal abnormality.  Adrenals: Unremarkable.  Kidneys and ureters: No hydronephrosis or stone. Tiny presumed cyst in the interpolar left kidney.  Bladder: Unremarkable.  Reproductive: Hysterectomy.  Bowel: No obstruction.  Retroperitoneum: No mass or adenopathy.  Peritoneum: No free fluid or gas.  Vascular: No acute abnormality.  OSSEOUS: Unchanged linear sclerosis in the left more than right sacral ala, consistent with previous insufficiency fractures. Remote L1 and L3 compression fractures with unchanged height loss greater than 50%. Remote 12 superior endplate fracture. L4-5 anterolisthesis related to advanced facet  osteoarthritis. No acute fracture seen  IMPRESSION: 1. Distal esophageal thickening/inflammation status post recent hiatal hernia repair with Nissen fundoplication. There is a 2 x 1 x 1.5 cm fluid collection along the lower right esophagus which is likely incidental/postoperative, but is sterility indeterminate by imaging. No evidence of esophageal leak. 2. Chronic intra-abdominal and intrathoracic findings are stable from recent CT comparisons, described above.   Electronically Signed   By: Jorje Guild M.D.   On: 05/13/2013 07:13   Dg Abd Acute W/chest  05/13/2013   CLINICAL DATA:  Right lower quadrant pain, recent hiatal hernia fundoplication.  EXAM: ACUTE ABDOMEN SERIES (ABDOMEN 2 VIEW & CHEST 1 VIEW)  COMPARISON:  Acute abdominal series April 22, 2013  FINDINGS: Cardiomediastinal silhouette is nonsuspicious, unchanged. Mild chronic interstitial changes with scarring in left lung base. No pleural effusions or focal consolidations. No pneumothorax.  Bowel gas pattern is nondilated and nonobstructive. Surgical clips in the right abdomen likely reflect cholecystectomy. Surgical clips at the gastroesophageal junction. No intra-abdominal mass effect or pathologic calcifications. No free air. Phleboliths in the pelvis. Patient is osteopenic. Mild levoscoliosis of the lumbar spine is unchanged. Mild L1 compression fracture may be new, chronic L3 moderate to severe compression deformity.  IMPRESSION: No acute cardiopulmonary process.  Nonspecific bowel gas pattern.  Osteopenia, chronic L3 moderate compression fracture with new appearing L1 compression fracture, recommend correlation with point tenderness.  Electronically Signed   By: Elon Alas   On: 05/13/2013 02:03   Dg Esophagus W/water Sol Cm  05/14/2013   CLINICAL DATA:  Post redo fundoplication.  EXAM: ESOPHOGRAM/BARIUM SWALLOW  TECHNIQUE: Single contrast examination was performed using  water soluble.  COMPARISON:  05/13/2013 CT.  FLUOROSCOPY  TIME:  2 min and 5 seconds  FINDINGS: Radiopaque material at the gastroesophageal junction consistent with material utilized for procedure in addition to surgical clips.  Circumferential narrowing at the reconstructed gastroesophageal junction without leak identified.  There is slow transit time through this region which may be partially explained by postoperative edema in addition to the operative procedure.  With the patient in an upright position, ingested contrast remains within the distal esophagus above this region of circumferential narrowing but subsequently clears with dry swallowing.  Remote compression fracture L1 and L3 vertebral body incompletely assessed.  IMPRESSION: Radiopaque material at the gastroesophageal junction consistent with material utilized for procedure in addition to surgical clips.  Circumferential narrowing at the reconstructed gastroesophageal junction without leak identified.  There is slow transit time through this region which may be partially explained by postoperative edema in addition to the operative procedure.  With the patient in an upright position, ingested contrast remains within the distal esophagus above this region of circumferential narrowing but subsequently clears with dry swallowing.  This is a call report.   Electronically Signed   By: Chauncey Cruel M.D.   On: 05/14/2013 10:36   Dg Duanne Limerick W/kub  06/21/2013   CLINICAL DATA:  Severe nausea. Recurrent paraesophageal hernia with repair and fundoplication. Evaluate pyloroplasty.  EXAM: UPPER GI SERIES W/ KUB  TECHNIQUE: After obtaining a scout radiograph a routine upper GI series was performed using thin barium  COMPARISON:  DG UGI W/HIGH DENSITY W/KUB dated 03/14/2013  FLUOROSCOPY TIME:  1 min, 36 seconds  FINDINGS: When compared to prior study, there is been reduction of the previously seen large hiatal hernia. Defect noted at the proximal stomach/GE junction compatible with prior fundoplication.  There is severe  esophageal dysmotility with disruption of primary peristaltic waves. Extensive tertiary contractions. Full column stasis of barium in the esophagus.  There is normal emptying of the stomach. No evidence of contrast extravasation. No evidence of gastric outlet obstruction. No ulceration, mass or fold thickening within the stomach or duodenum.  Scout film of the abdomen demonstrates normal bowel gas pattern with moderate stool in the colon. Prior cholecystectomy.  IMPRESSION: Interval repair of the hiatal hernia and fundoplication.  Severe esophageal dysmotility with esophageal spasm and full column stasis.  Stomach and duodenum unremarkable.   Electronically Signed   By: Rolm Baptise M.D.   On: 06/21/2013 11:33    Review of Systems  Constitutional: Negative for fever, chills and diaphoresis.  HENT: Negative for ear pain, sore throat and trouble swallowing.   Eyes: Negative for photophobia and visual disturbance.  Respiratory: Negative for cough, choking and shortness of breath.   Cardiovascular: Negative for chest pain and palpitations.  Gastrointestinal: Positive for constipation. Negative for nausea, vomiting, abdominal pain, diarrhea, blood in stool, abdominal distention, anal bleeding and rectal pain.  Genitourinary: Negative for dysuria, frequency, decreased urine volume and difficulty urinating.  Musculoskeletal: Positive for back pain and myalgias. Negative for gait problem.  Skin: Negative for color change, pallor and rash.  Neurological: Negative for dizziness, speech difficulty, weakness and numbness.  Hematological: Negative for adenopathy.  Psychiatric/Behavioral: Negative for confusion and agitation. The patient is nervous/anxious.  Objective:   Physical Exam  Constitutional: She is oriented to person, place, and time. She appears well-developed and well-nourished. No distress.  HENT:  Head: Normocephalic.  Mouth/Throat: Oropharynx is clear and moist. No oropharyngeal  exudate.  Eyes: Conjunctivae and EOM are normal. Pupils are equal, round, and reactive to light. No scleral icterus.  Neck: Normal range of motion. No tracheal deviation present.  Cardiovascular: Normal rate and intact distal pulses.   Pulmonary/Chest: Effort normal. No respiratory distress. She exhibits no tenderness.  Abdominal: Soft. She exhibits no distension. There is no tenderness. Hernia confirmed negative in the right inguinal area and confirmed negative in the left inguinal area.  Incisions clean with normal healing ridges.  No hernias  Genitourinary: No vaginal discharge found.  Musculoskeletal: Normal range of motion.       Thoracic back: Normal. She exhibits no tenderness, no bony tenderness, no pain and no spasm.       Lumbar back: She exhibits normal range of motion, no tenderness, no bony tenderness, no deformity, no laceration, no pain and no spasm.       Back:  Lymphadenopathy:       Right: No inguinal adenopathy present.       Left: No inguinal adenopathy present.  Neurological: She is alert and oriented to person, place, and time. She displays tremor. No cranial nerve deficit. She exhibits normal muscle tone. She displays no seizure activity. Coordination normal.  Mild left hand tremor  Skin: Skin is warm and dry. No rash noted. She is not diaphoretic.  Psychiatric: Her behavior is normal. Judgment and thought content normal. Her mood appears anxious. Her speech is not rapid and/or pressured and not delayed. She is not agitated and not aggressive. Cognition and memory are normal.  Consolable.  Nearly tearful at the beginning of visit.  Smiling at the end of the visit.       Assessment:     Recovering much better repair of recurrent paraesophageal hernia.    Chronic nausea milder problem.  Worsening chronic lumbar low back pain.     Plan:     I am glad that she is better.  It is not surprising that it took a while given her other health issues.  I am hopeful things  will continue to improve.  She definitely feels like she is in a better placement before surgery.  Her husband agrees as well.  Small rash on left posterior flank looks like shingles/zoster to me.  However it is not a large region and she is asymptomatic.  I recommend observation only at this time with a low threshold to be evaluated by her PCP sooner if it worsens to consider antirvirals or topical therapy.  They seemed reassured.  Increase activity as tolerated to regular activity.  WALK MORE.  Low impact exercise such as walking an hour a day at least ideal.  Do not push through pain.  Eventually would like to get her off narcotics, but I do not think that is feasible with her severe lumbar back pain.  Tramadol seems to be the only narcotic she can tolerate.  I do not feel any this time.  She did not aspirin a baby.  If she needs more narcotics, I will defer to her primary care physician and Dr. Louanne Skye on that.  Defer to the use of benzodiazepines and anxiolytics to her primary care physician.  Diet as tolerated.  Continue to advance diet consistency per post esophageal surgery handout/recommendations.  While the  upper GIs concerning for perhaps slow transit across her wrap she has no dysphagia or reflux.    Reglan as needed.  Phenergan for breakthrough nausea.  If this does not work, switch to Zofran.  Issue of preauthorization with her insurance company.  If that does not help, consider upper endoscopy by Dr. Deatra [name] to see if balloon dialation will help that out.  Fiber supplement at least twice a day.  Miralax.  Low fat high fiber diet ideal.  Bowel regimen with 30 g fiber a day and fiber supplement as needed to avoid problems.  Return as needed.   Instructions discussed.  Followup with primary care physician for other health issues as would normally be done.  Consider screening for malignancies (breast, prostate, colon, melanoma, etc) as appropriate.  Questions answered.  She was smiling and  feeling better.  The patient & husband expressed understanding and appreciation

## 2013-07-29 ENCOUNTER — Ambulatory Visit (HOSPITAL_COMMUNITY): Admission: RE | Admit: 2013-07-29 | Payer: Medicare HMO | Source: Ambulatory Visit

## 2013-08-14 ENCOUNTER — Ambulatory Visit (INDEPENDENT_AMBULATORY_CARE_PROVIDER_SITE_OTHER): Payer: Medicare HMO | Admitting: Internal Medicine

## 2013-08-14 ENCOUNTER — Encounter: Payer: Self-pay | Admitting: Internal Medicine

## 2013-08-14 VITALS — BP 120/64 | HR 72 | Temp 98.1°F | Resp 16 | Ht 63.5 in | Wt 129.8 lb

## 2013-08-14 DIAGNOSIS — E559 Vitamin D deficiency, unspecified: Secondary | ICD-10-CM

## 2013-08-14 DIAGNOSIS — R7303 Prediabetes: Secondary | ICD-10-CM | POA: Insufficient documentation

## 2013-08-14 DIAGNOSIS — R7309 Other abnormal glucose: Secondary | ICD-10-CM

## 2013-08-14 DIAGNOSIS — Z79899 Other long term (current) drug therapy: Secondary | ICD-10-CM

## 2013-08-14 DIAGNOSIS — I10 Essential (primary) hypertension: Secondary | ICD-10-CM

## 2013-08-14 DIAGNOSIS — E785 Hyperlipidemia, unspecified: Secondary | ICD-10-CM

## 2013-08-14 DIAGNOSIS — Z136 Encounter for screening for cardiovascular disorders: Secondary | ICD-10-CM

## 2013-08-14 LAB — HEMOGLOBIN A1C
Hgb A1c MFr Bld: 5.7 % — ABNORMAL HIGH (ref <5.7)
MEAN PLASMA GLUCOSE: 117 mg/dL — AB (ref <117)

## 2013-08-14 NOTE — Patient Instructions (Signed)
 Hypertension As your heart beats, it forces blood through your arteries. This force is your blood pressure. If the pressure is too high, it is called hypertension (HTN) or high blood pressure. HTN is dangerous because you may have it and not know it. High blood pressure may mean that your heart has to work harder to pump blood. Your arteries may be narrow or stiff. The extra work puts you at risk for heart disease, stroke, and other problems.  Blood pressure consists of two numbers, a higher number over a lower, 110/72, for example. It is stated as "110 over 72." The ideal is below 120 for the top number (systolic) and under 80 for the bottom (diastolic). Write down your blood pressure today. You should pay close attention to your blood pressure if you have certain conditions such as:  Heart failure.  Prior heart attack.  Diabetes  Chronic kidney disease.  Prior stroke.  Multiple risk factors for heart disease. To see if you have HTN, your blood pressure should be measured while you are seated with your arm held at the level of the heart. It should be measured at least twice. A one-time elevated blood pressure reading (especially in the Emergency Department) does not mean that you need treatment. There may be conditions in which the blood pressure is different between your right and left arms. It is important to see your caregiver soon for a recheck. Most people have essential hypertension which means that there is not a specific cause. This type of high blood pressure may be lowered by changing lifestyle factors such as:  Stress.  Smoking.  Lack of exercise.  Excessive weight.  Drug/tobacco/alcohol use.  Eating less salt. Most people do not have symptoms from high blood pressure until it has caused damage to the body. Effective treatment can often prevent, delay or reduce that damage. TREATMENT  When a cause has been identified, treatment for high blood pressure is directed at  the cause. There are a large number of medications to treat HTN. These fall into several categories, and your caregiver will help you select the medicines that are best for you. Medications may have side effects. You should review side effects with your caregiver. If your blood pressure stays high after you have made lifestyle changes or started on medicines,   Your medication(s) may need to be changed.  Other problems may need to be addressed.  Be certain you understand your prescriptions, and know how and when to take your medicine.  Be sure to follow up with your caregiver within the time frame advised (usually within two weeks) to have your blood pressure rechecked and to review your medications.  If you are taking more than one medicine to lower your blood pressure, make sure you know how and at what times they should be taken. Taking two medicines at the same time can result in blood pressure that is too low. SEEK IMMEDIATE MEDICAL CARE IF:  You develop a severe headache, blurred or changing vision, or confusion.  You have unusual weakness or numbness, or a faint feeling.  You have severe chest or abdominal pain, vomiting, or breathing problems. MAKE SURE YOU:   Understand these instructions.  Will watch your condition.  Will get help right away if you are not doing well or get worse.   Diabetes and Exercise Exercising regularly is important. It is not just about losing weight. It has many health benefits, such as:  Improving your overall fitness, flexibility, and   endurance.  Increasing your bone density.  Helping with weight control.  Decreasing your body fat.  Increasing your muscle strength.  Reducing stress and tension.  Improving your overall health. People with diabetes who exercise gain additional benefits because exercise:  Reduces appetite.  Improves the body's use of blood sugar (glucose).  Helps lower or control blood glucose.  Decreases blood  pressure.  Helps control blood lipids (such as cholesterol and triglycerides).  Improves the body's use of the hormone insulin by:  Increasing the body's insulin sensitivity.  Reducing the body's insulin needs.  Decreases the risk for heart disease because exercising:  Lowers cholesterol and triglycerides levels.  Increases the levels of good cholesterol (such as high-density lipoproteins [HDL]) in the body.  Lowers blood glucose levels. YOUR ACTIVITY PLAN  Choose an activity that you enjoy and set realistic goals. Your health care provider or diabetes educator can help you make an activity plan that works for you. You can break activities into 2 or 3 sessions throughout the day. Doing so is as good as one long session. Exercise ideas include:  Taking the dog for a walk.  Taking the stairs instead of the elevator.  Dancing to your favorite song.  Doing your favorite exercise with a friend. RECOMMENDATIONS FOR EXERCISING WITH TYPE 1 OR TYPE 2 DIABETES   Check your blood glucose before exercising. If blood glucose levels are greater than 240 mg/dL, check for urine ketones. Do not exercise if ketones are present.  Avoid injecting insulin into areas of the body that are going to be exercised. For example, avoid injecting insulin into:  The arms when playing tennis.  The legs when jogging.  Keep a record of:  Food intake before and after you exercise.  Expected peak times of insulin action.  Blood glucose levels before and after you exercise.  The type and amount of exercise you have done.  Review your records with your health care provider. Your health care provider will help you to develop guidelines for adjusting food intake and insulin amounts before and after exercising.  If you take insulin or oral hypoglycemic agents, watch for signs and symptoms of hypoglycemia. They include:  Dizziness.  Shaking.  Sweating.  Chills.  Confusion.  Drink plenty of water  while you exercise to prevent dehydration or heat stroke. Body water is lost during exercise and must be replaced.  Talk to your health care provider before starting an exercise program to make sure it is safe for you. Remember, almost any type of activity is better than none.    Cholesterol Cholesterol is a white, waxy, fat-like protein needed by your body in small amounts. The liver makes all the cholesterol you need. It is carried from the liver by the blood through the blood vessels. Deposits (plaque) may build up on blood vessel walls. This makes the arteries narrower and stiffer. Plaque increases the risk for heart attack and stroke. You cannot feel your cholesterol level even if it is very high. The only way to know is by a blood test to check your lipid (fats) levels. Once you know your cholesterol levels, you should keep a record of the test results. Work with your caregiver to to keep your levels in the desired range. WHAT THE RESULTS MEAN:  Total cholesterol is a rough measure of all the cholesterol in your blood.  LDL is the so-called bad cholesterol. This is the type that deposits cholesterol in the walls of the arteries. You want this   level to be low.  HDL is the good cholesterol because it cleans the arteries and carries the LDL away. You want this level to be high.  Triglycerides are fat that the body can either burn for energy or store. High levels are closely linked to heart disease. DESIRED LEVELS:  Total cholesterol below 200.  LDL below 100 for people at risk, below 70 for very high risk.  HDL above 50 is good, above 60 is best.  Triglycerides below 150. HOW TO LOWER YOUR CHOLESTEROL:  Diet.  Choose fish or white meat chicken and turkey, roasted or baked. Limit fatty cuts of red meat, fried foods, and processed meats, such as sausage and lunch meat.  Eat lots of fresh fruits and vegetables. Choose whole grains, beans, pasta, potatoes and cereals.  Use only  small amounts of olive, corn or canola oils. Avoid butter, mayonnaise, shortening or palm kernel oils. Avoid foods with trans-fats.  Use skim/nonfat milk and low-fat/nonfat yogurt and cheeses. Avoid whole milk, cream, ice cream, egg yolks and cheeses. Healthy desserts include angel food cake, ginger snaps, animal crackers, hard candy, popsicles, and low-fat/nonfat frozen yogurt. Avoid pastries, cakes, pies and cookies.  Exercise.  A regular program helps decrease LDL and raises HDL.  Helps with weight control.  Do things that increase your activity level like gardening, walking, or taking the stairs.  Medication.  May be prescribed by your caregiver to help lowering cholesterol and the risk for heart disease.  You may need medicine even if your levels are normal if you have several risk factors. HOME CARE INSTRUCTIONS   Follow your diet and exercise programs as suggested by your caregiver.  Take medications as directed.  Have blood work done when your caregiver feels it is necessary. MAKE SURE YOU:   Understand these instructions.  Will watch your condition.  Will get help right away if you are not doing well or get worse.      Vitamin D Deficiency Vitamin D is an important vitamin that your body needs. Having too little of it in your body is called a deficiency. A very bad deficiency can make your bones soft and can cause a condition called rickets.  Vitamin D is important to your body for different reasons, such as:   It helps your body absorb 2 minerals called calcium and phosphorus.  It helps make your bones healthy.  It may prevent some diseases, such as diabetes and multiple sclerosis.  It helps your muscles and heart. You can get vitamin D in several ways. It is a natural part of some foods. The vitamin is also added to some dairy products and cereals. Some people take vitamin D supplements. Also, your body makes vitamin D when you are in the sun. It changes the  sun's rays into a form of the vitamin that your body can use. CAUSES   Not eating enough foods that contain vitamin D.  Not getting enough sunlight.  Having certain digestive system diseases that make it hard to absorb vitamin D. These diseases include Crohn's disease, chronic pancreatitis, and cystic fibrosis.  Having a surgery in which part of the stomach or small intestine is removed.  Being obese. Fat cells pull vitamin D out of your blood. That means that obese people may not have enough vitamin D left in their blood and in other body tissues.  Having chronic kidney or liver disease. RISK FACTORS Risk factors are things that make you more likely to develop a vitamin   D deficiency. They include:  Being older.  Not being able to get outside very much.  Living in a nursing home.  Having had broken bones.  Having weak or thin bones (osteoporosis).  Having a disease or condition that changes how your body absorbs vitamin D.  Having dark skin.  Some medicines such as seizure medicines or steroids.  Being overweight or obese. SYMPTOMS Mild cases of vitamin D deficiency may not have any symptoms. If you have a very bad case, symptoms may include:  Bone pain.  Muscle pain.  Falling often.  Broken bones caused by a minor injury, due to osteoporosis. DIAGNOSIS A blood test is the best way to tell if you have a vitamin D deficiency. TREATMENT Vitamin D deficiency can be treated in different ways. Treatment for vitamin D deficiency depends on what is causing it. Options include:  Taking vitamin D supplements.  Taking a calcium supplement. Your caregiver will suggest what dose is best for you. HOME CARE INSTRUCTIONS  Take any supplements that your caregiver prescribes. Follow the directions carefully. Take only the suggested amount.  Have your blood tested 2 months after you start taking supplements.  Eat foods that contain vitamin D. Healthy choices  include:  Fortified dairy products, cereals, or juices. Fortified means vitamin D has been added to the food. Check the label on the package to be sure.  Fatty fish like salmon or trout.  Eggs.  Oysters.  Do not use a tanning bed.  Keep your weight at a healthy level. Lose weight if you need to.  Keep all follow-up appointments. Your caregiver will need to perform blood tests to make sure your vitamin D deficiency is going away. SEEK MEDICAL CARE IF:  You have any questions about your treatment.  You continue to have symptoms of vitamin D deficiency.  You have nausea or vomiting.  You are constipated.  You feel confused.  You have severe abdominal or back pain. MAKE SURE YOU:  Understand these instructions.  Will watch your condition.  Will get help right away if you are not doing well or get worse.   

## 2013-08-14 NOTE — Progress Notes (Signed)
Patient ID: [name] [name], female   DOB: 1935-09-12, 78 y.o.   MRN: [id number]    This very nice 78 y.o. MWF presents for 3 month follow up with Hypertension, ASCAD, Hyperlipidemia, Pre-Diabetes and Vitamin D Deficiency. In Dec 2014, she had repair of an incarcerated paraesophageal hernia by Dr Leane Para.   HTN predates since 1986. BP has been controlled at home. Today's BP is  120/64 mmHg. Patient also has ASCAD with a stent placed in 2003 and last Cardiolyte in 2014 was negative and low risk. GFR in Oct 2014 calculated 56 ml/min consistent with Stage III CKD. Patient denies any cardiac type chest pain, palpitations, dyspnea/orthopnea/PND, dizziness, claudication, or dependent edema.   Hyperlipidemia is controlled with diet & meds. Last Cholesterol was 130, Triglycerides were 196, HDL 40 and LDL 51 in Oct 2014. Patient denies myalgias or other med SE's.    Also, the patient has history of PreDiabetes with A1c 5.8% in 2012 and 6.2% in Sept 2012 and A1c was 6.2% in Oct2014. Patient denies any symptoms of reactive hypoglycemia, diabetic polys, paresthesias or visual blurring.   Further, Patient has history of Vitamin D Deficiency of 25 in 2008 and with last vitamin D of 81 in Oct 2014. Patient supplements vitamin D without any suspected side-effects.  Medication Sig  . Aclidinium Bromide (TUDORZA PRESSAIR) 400 MCG/ACT AEPB Inhale 1 puff into the lungs 2 (two) times daily.  [name] ALPRAZolam (XANAX) 0.5 MG tablet Take 0.5 mg by mouth 3 (three) times daily as needed for sleep or anxiety.   [name] amLODipine (NORVASC) 10 MG tablet Take 10 mg by mouth every morning.   . calcitonin, salmon, (MIACALCIN/FORTICAL) 200 UNIT/ACT nasal spray Place 1 spray into the nose every morning. She alternates nostrils every other day.  . Cholecalciferol (VITAMIN D3) 2000 UNITS TABS Take 6,000 Units by mouth daily.   . Cyanocobalamin (VITAMIN B 12 PO) Take 1 tablet by mouth every morning.   . gabapentin (NEURONTIN) 300 MG capsule Take  1 capsule (300 mg total) by mouth 3 (three) times daily.  [name] losartan (COZAAR) 100 MG tablet TAKE ONE TABLET BY MOUTH EVERY DAY **DISCONTUNIE BENICAR**  . metoCLOPramide (REGLAN) 10 MG tablet TAKE ONE HALF TO ONE TABLET BY MOUTH THREE TIMES DAILY BEFORE MEAL(S) FOR NAUSEA  . metoprolol tartrate (LOPRESSOR) 25 MG tablet Take 12.5 mg by mouth at bedtime.  . mometasone-formoterol (DULERA) 200-5 MCG/ACT AERO Inhale 2 puffs into the lungs 2 (two) times daily.  . MULTIVITAMIN WITH MINERALS  Take 1 tablet by mouth every morning.   [name] omeprazole (PRILOSEC) 20 MG  Take 1 capsule (20 mg total) by mouth daily.  . ondansetron (ZOFRAN ODT) 4 MG  Take 1 tablet  every 8  hr as needed for nausea  . Coral Calcium 1000mg  Take 1,000 mg by mouth every other day.   . Magnesium Carbonate 400mg  Take 400 mg by mouth 3 (three) times daily.   . pantoprazole (PROTONIX) 40 MG  Take 40 mg by mouth 2 (two) times daily.  . polyethylene glycol  (MIRALAX)  Take 8.5-34 g by mouth 2 (two) times daily.   [name] K-DUR 10 MEQ tablet Take 1 tablet (10 mEq total) by mouth 2 (two) times daily.  . pravastatin 40 MG tablet Take 40 mg by mouth at bedtime.   . promethazine  12.5 MG tablet Take 1 tablet  every 6  hours as needed for nausea or vomiting.  [name] SILVADENE 1 % cream Apply to affected area daily  .  traMADol (ULTRAM) 50 MG tablet Take 1-2 tablets every 6  hours as needed for moderate pain or severe pain.     Allergies  Allergen Reactions  . Aspartame Diarrhea and Nausea And Vomiting  . Daliresp [Roflumilast] Nausea And Vomiting  . Hydrocodone Nausea Only  . Oxycodone Nausea Only  . Phenergan [Promethazine Hcl] Other (See Comments)    "Doesn't work"  . Ace Inhibitors Cough    Per Dr Melvyn Novas pulmonology 2013    PMHx:   Past Medical History  Diagnosis Date  . CAD (coronary artery disease)     LHC 2003 with 40% pLAD, 30% mLAD, 100% D1 (moderate vessel), 100%  D2 (small vessel), 95% mid small OM2.  Pt had PTCA to D1;  D2 and OM2 small  vessels and tx medically;  Last Myoview (2012): anterior infarct seen with scar, no ischemia. EF normal.  Lexiscan Myoview (12/14):  Low risk; ant scar with very mild peri-infarct ischemia; EF 55% with ant AK   . Recurrent aspiration bronchitis/pneumonia     PFTs 12/09 FVC 97%, FEV1 101%, ratio 77%, TLC 109%. No significant obstruction or restriction.   . Hypertension   . Hyperlipidemia   . Esophageal stricture   . Diverticulosis of colon   . Gastroesophageal reflux disease with hiatal hernia   . Iron deficiency anemia   . Gastritis   . GERD (gastroesophageal reflux disease)   . Hiatal hernia   . Asthma   . Blood transfusion   . Cough   . Wheezing   . Weakness   . Trouble swallowing   . Abdominal pain   . Hiatal hernia   . Shortness of breath   . Anxiety   . Complication of anesthesia   . PONV (postoperative nausea and vomiting)   . TIA (transient ischemic attack)   . BRONCHITIS, ACUTE 07/25/2007         . DIVERTICULOSIS, COLON 08/28/2008    Qualifier: Diagnosis of  By: [name], [name], [name]    . IRON DEFICIENCY ANEMIA, HX OF 07/24/2007    Qualifier: Diagnosis of  By: Tilden Dome     FHx:    Reviewed / unchanged  SHx:    Reviewed / unchanged   Systems Review: Constitutional: Denies fever, chills, wt changes, headaches, insomnia, fatigue, night sweats, change in appetite. Eyes: Denies redness, blurred vision, diplopia, discharge, itchy, watery eyes.  ENT: Denies discharge, congestion, post nasal drip, epistaxis, sore throat, earache, hearing loss, dental pain, tinnitus, vertigo, sinus pain, snoring.  CV: Denies chest pain, palpitations, irregular heartbeat, syncope, dyspnea, diaphoresis, orthopnea, PND, claudication, edema. Respiratory: denies cough, dyspnea, DOE, pleurisy, hoarseness, laryngitis, wheezing.  Gastrointestinal: Denies dysphagia, odynophagia, heartburn, reflux, water brash, abdominal pain or cramps, nausea, vomiting, bloating, diarrhea, constipation, hematemesis,  melena, hematochezia,  or hemorrhoids. Genitourinary: Denies dysuria, frequency, urgency, nocturia, hesitancy, discharge, hematuria, flank pain. Musculoskeletal: Denies arthralgias, myalgias, stiffness, jt. swelling, pain, limp, strain/sprain.  Skin: Denies pruritus, rash, hives, warts, acne, eczema, change in skin lesion(s). Neuro: No weakness, tremor, incoordination, spasms, paresthesia, or pain. Psychiatric: Denies confusion, memory loss, or sensory loss. Endo: Denies change in weight, skin, hair change.  Heme/Lymph: No excessive bleeding, bruising, orenlarged lymph nodes.  Exam:    BP 120/64  Pulse 72  Tem 98.1 F   Resp 16  Ht 5' 3.5"   Wt 129 lb 12.8 oz   BMI 22.63 kg/m2  Appears well nourished - in no distress. Eyes: PERRLA, EOMs, conjunctiva no swelling or erythema. Sinuses: No frontal/maxillary tenderness ENT/Mouth:  EAC's clear, TM's nl w/o erythema, bulging. Nares clear w/o erythema, swelling, exudates. Oropharynx clear without erythema or exudates. Oral hygiene is good. Tongue normal, non obstructing. Hearing intact.  Neck: Supple. Thyroid nl. Car 2+/2+ without bruits, nodes or JVD. Chest: Respirations nl with BS clear & equal w/o rales, rhonchi, wheezing or stridor.  Cor: Heart sounds normal w/ regular rate and rhythm without sig. murmurs, gallops, clicks, or rubs. Peripheral pulses normal and equal  without edema.  Abdomen: Soft & bowel sounds normal. Non-tender w/o guarding, rebound, hernias, masses, or organomegaly.  Lymphatics: Unremarkable.  Musculoskeletal: Full ROM all peripheral extremities, joint stability, 5/5 strength, and normal gait.  Skin: Warm, dry without exposed rashes, lesions, ecchymosis apparent.  Neuro: Cranial nerves intact, reflexes equal bilaterally. Sensory-motor testing grossly intact. Tendon reflexes grossly intact.  Pysch: Alert & oriented x 3. Insight and judgement nl & appropriate. No ideations.  Assessment and Plan:  1. Hypertension -  Continue monitor blood pressure at home. Continue diet/meds same.  2. Hyperlipidemia - Continue diet/meds, exercise,& lifestyle modifications. Continue monitor periodic cholesterol/liver & renal functions   3. Pre-diabetes - Continue diet, exercise, lifestyle modifications. Monitor appropriate labs.  4. Vitamin D Deficiency - Continue supplementation.  5. ASCAD, S/P stent 2003  Recommended regular exercise, BP monitoring, weight control, and discussed med and SE's. Recommended labs to assess and monitor clinical status. Further disposition pending results of labs.

## 2013-08-15 ENCOUNTER — Telehealth: Payer: Self-pay | Admitting: Internal Medicine

## 2013-08-15 ENCOUNTER — Telehealth: Payer: Self-pay | Admitting: *Deleted

## 2013-08-15 LAB — CBC WITH DIFFERENTIAL/PLATELET
BASOS PCT: 1 % (ref 0–1)
Basophils Absolute: 0.1 10*3/uL (ref 0.0–0.1)
Eosinophils Absolute: 0.2 10*3/uL (ref 0.0–0.7)
Eosinophils Relative: 3 % (ref 0–5)
HCT: 39.8 % (ref 36.0–46.0)
HEMOGLOBIN: 13.1 g/dL (ref 12.0–15.0)
Lymphocytes Relative: 20 % (ref 12–46)
Lymphs Abs: 1.2 10*3/uL (ref 0.7–4.0)
MCH: 29.4 pg (ref 26.0–34.0)
MCHC: 32.9 g/dL (ref 30.0–36.0)
MCV: 89.4 fL (ref 78.0–100.0)
Monocytes Absolute: 0.5 10*3/uL (ref 0.1–1.0)
Monocytes Relative: 8 % (ref 3–12)
Neutro Abs: 4 10*3/uL (ref 1.7–7.7)
Neutrophils Relative %: 68 % (ref 43–77)
Platelets: 353 10*3/uL (ref 150–400)
RBC: 4.45 MIL/uL (ref 3.87–5.11)
RDW: 13.7 % (ref 11.5–15.5)
WBC: 5.9 10*3/uL (ref 4.0–10.5)

## 2013-08-15 LAB — HEPATIC FUNCTION PANEL
ALT: 13 U/L (ref 0–35)
AST: 13 U/L (ref 0–37)
Albumin: 4.1 g/dL (ref 3.5–5.2)
Alkaline Phosphatase: 83 U/L (ref 39–117)
BILIRUBIN DIRECT: 0.2 mg/dL (ref 0.0–0.3)
BILIRUBIN INDIRECT: 0.5 mg/dL (ref 0.2–1.2)
Total Bilirubin: 0.7 mg/dL (ref 0.2–1.2)
Total Protein: 6.2 g/dL (ref 6.0–8.3)

## 2013-08-15 LAB — LIPID PANEL
Cholesterol: 123 mg/dL (ref 0–200)
HDL: 62 mg/dL (ref >39)
LDL Cholesterol: 49 mg/dL (ref 0–99)
TRIGLYCERIDES: 61 mg/dL (ref <150)
Total CHOL/HDL Ratio: 2 Ratio
VLDL: 12 mg/dL (ref 0–40)

## 2013-08-15 LAB — BASIC METABOLIC PANEL WITH GFR
BUN: 12 mg/dL (ref 6–23)
CO2: 28 mEq/L (ref 19–32)
Calcium: 9.5 mg/dL (ref 8.4–10.5)
Chloride: 103 mEq/L (ref 96–112)
Creat: 0.68 mg/dL (ref 0.50–1.10)
GFR, Est African American: >89 mL/min
GFR, Est Non African American: 86 mL/min
Glucose, Bld: 133 mg/dL — ABNORMAL HIGH (ref 70–99)
Potassium: 4.2 mEq/L (ref 3.5–5.3)
SODIUM: 141 meq/L (ref 135–145)

## 2013-08-15 LAB — TSH: TSH: 0.727 u[IU]/mL (ref 0.350–4.500)

## 2013-08-15 LAB — VITAMIN D 25 HYDROXY (VIT D DEFICIENCY, FRACTURES): VIT D 25 HYDROXY: 83 ng/mL (ref 30–89)

## 2013-08-15 LAB — INSULIN, FASTING: Insulin fasting, serum: 37 u[IU]/mL — ABNORMAL HIGH (ref 3–28)

## 2013-08-15 LAB — MAGNESIUM: Magnesium: 1.9 mg/dL (ref 1.5–2.5)

## 2013-08-15 NOTE — Telephone Encounter (Signed)
PT CALLED TO [facility] AND DOES NOT WISH TO R/S AT THIS TIME. MESSAGE TO DESK NURSE.

## 2013-08-15 NOTE — Telephone Encounter (Signed)
Pt is on 6 month observation for MGUS.  Pt called and informed Melissa in scheduling that she no longer will be coming back to [facility] for f/u appts.  Sent in basket msg to Dr Vista Mink.  SLJ

## 2013-08-16 ENCOUNTER — Telehealth (HOSPITAL_COMMUNITY): Payer: Self-pay | Admitting: Interventional Radiology

## 2013-08-16 ENCOUNTER — Other Ambulatory Visit (HOSPITAL_COMMUNITY): Payer: Self-pay | Admitting: Interventional Radiology

## 2013-08-16 DIAGNOSIS — M549 Dorsalgia, unspecified: Secondary | ICD-10-CM

## 2013-08-16 DIAGNOSIS — IMO0002 Reserved for concepts with insufficient information to code with codable children: Secondary | ICD-10-CM

## 2013-08-16 NOTE — Telephone Encounter (Signed)
Called pt, left VM for her to call to schedule back procedure JM

## 2013-08-19 ENCOUNTER — Other Ambulatory Visit: Payer: Self-pay | Admitting: Radiology

## 2013-08-20 ENCOUNTER — Encounter (HOSPITAL_COMMUNITY): Payer: Self-pay

## 2013-08-20 ENCOUNTER — Ambulatory Visit (HOSPITAL_COMMUNITY)
Admission: RE | Admit: 2013-08-20 | Discharge: 2013-08-20 | Disposition: A | Discharge status: Home / Self Care | Payer: Medicare HMO | Source: Ambulatory Visit | Attending: Interventional Radiology | Admitting: Interventional Radiology

## 2013-08-20 DIAGNOSIS — D509 Iron deficiency anemia, unspecified: Secondary | ICD-10-CM | POA: Insufficient documentation

## 2013-08-20 DIAGNOSIS — Z79899 Other long term (current) drug therapy: Secondary | ICD-10-CM | POA: Insufficient documentation

## 2013-08-20 DIAGNOSIS — Z8249 Family history of ischemic heart disease and other diseases of the circulatory system: Secondary | ICD-10-CM | POA: Insufficient documentation

## 2013-08-20 DIAGNOSIS — Z888 Allergy status to other drugs, medicaments and biological substances status: Secondary | ICD-10-CM | POA: Insufficient documentation

## 2013-08-20 DIAGNOSIS — K449 Diaphragmatic hernia without obstruction or gangrene: Secondary | ICD-10-CM | POA: Insufficient documentation

## 2013-08-20 DIAGNOSIS — M549 Dorsalgia, unspecified: Secondary | ICD-10-CM

## 2013-08-20 DIAGNOSIS — E785 Hyperlipidemia, unspecified: Secondary | ICD-10-CM | POA: Insufficient documentation

## 2013-08-20 DIAGNOSIS — Z885 Allergy status to narcotic agent status: Secondary | ICD-10-CM | POA: Insufficient documentation

## 2013-08-20 DIAGNOSIS — J45909 Unspecified asthma, uncomplicated: Secondary | ICD-10-CM | POA: Insufficient documentation

## 2013-08-20 DIAGNOSIS — K219 Gastro-esophageal reflux disease without esophagitis: Secondary | ICD-10-CM | POA: Insufficient documentation

## 2013-08-20 DIAGNOSIS — S32009A Unspecified fracture of unspecified lumbar vertebra, initial encounter for closed fracture: Secondary | ICD-10-CM | POA: Insufficient documentation

## 2013-08-20 DIAGNOSIS — Z9889 Other specified postprocedural states: Secondary | ICD-10-CM | POA: Insufficient documentation

## 2013-08-20 DIAGNOSIS — IMO0002 Reserved for concepts with insufficient information to code with codable children: Secondary | ICD-10-CM

## 2013-08-20 DIAGNOSIS — I10 Essential (primary) hypertension: Secondary | ICD-10-CM | POA: Insufficient documentation

## 2013-08-20 DIAGNOSIS — I2510 Atherosclerotic heart disease of native coronary artery without angina pectoris: Secondary | ICD-10-CM | POA: Insufficient documentation

## 2013-08-20 DIAGNOSIS — X58XXXA Exposure to other specified factors, initial encounter: Secondary | ICD-10-CM | POA: Insufficient documentation

## 2013-08-20 LAB — APTT: APTT: 26 s (ref 24–37)

## 2013-08-20 LAB — CBC
HEMATOCRIT: 40 % (ref 36.0–46.0)
Hemoglobin: 13.3 g/dL (ref 12.0–15.0)
MCH: 30.1 pg (ref 26.0–34.0)
MCHC: 33.3 g/dL (ref 30.0–36.0)
MCV: 90.5 fL (ref 78.0–100.0)
PLATELETS: 338 10*3/uL (ref 150–400)
RBC: 4.42 MIL/uL (ref 3.87–5.11)
RDW: 13.1 % (ref 11.5–15.5)
WBC: 8.9 10*3/uL (ref 4.0–10.5)

## 2013-08-20 LAB — PROTIME-INR
INR: 0.96 (ref 0.00–1.49)
Prothrombin Time: 12.6 seconds (ref 11.6–15.2)

## 2013-08-20 LAB — BASIC METABOLIC PANEL
BUN: 13 mg/dL (ref 6–23)
CHLORIDE: 103 meq/L (ref 96–112)
CO2: 25 meq/L (ref 19–32)
Calcium: 9.4 mg/dL (ref 8.4–10.5)
Creatinine, Ser: 0.69 mg/dL (ref 0.50–1.10)
GFR calc non Af Amer: 83 mL/min — ABNORMAL LOW (ref >90)
Glucose, Bld: 96 mg/dL (ref 70–99)
POTASSIUM: 4 meq/L (ref 3.7–5.3)
SODIUM: 143 meq/L (ref 137–147)

## 2013-08-20 MED ORDER — HYDROMORPHONE HCL PF 1 MG/ML IJ SOLN
INTRAMUSCULAR | Status: AC | PRN
  Administered 2013-08-20: 1 mg

## 2013-08-20 MED ORDER — TRAMADOL HCL 50 MG PO TABS
50.0000 mg | ORAL_TABLET | ORAL | Status: AC
  Administered 2013-08-20: 50 mg via ORAL
  Filled 2013-08-20: qty 2

## 2013-08-20 MED ORDER — FENTANYL CITRATE 0.05 MG/ML IJ SOLN
INTRAMUSCULAR | Status: AC
Start: 2013-08-20 — End: 2013-08-20
  Filled 2013-08-20: qty 4

## 2013-08-20 MED ORDER — MIDAZOLAM HCL 2 MG/2ML IJ SOLN
INTRAMUSCULAR | Status: AC
  Filled 2013-08-20: qty 4

## 2013-08-20 MED ORDER — CEFAZOLIN SODIUM-DEXTROSE 2-3 GM-% IV SOLR
2.0000 g | INTRAVENOUS | Status: AC
  Administered 2013-08-20: 2 g via INTRAVENOUS
  Filled 2013-08-20: qty 50

## 2013-08-20 MED ORDER — SODIUM CHLORIDE 0.9 % IV SOLN
INTRAVENOUS | Status: DC
  Administered 2013-08-20: 10:00:00 via INTRAVENOUS

## 2013-08-20 MED ORDER — TOBRAMYCIN SULFATE 1.2 G IJ SOLR
INTRAMUSCULAR | Status: AC
Start: 2013-08-20 — End: 2013-08-20
  Filled 2013-08-20: qty 1.2

## 2013-08-20 MED ORDER — ONDANSETRON HCL 4 MG/2ML IJ SOLN: 4.0000 mg | Freq: Once | INTRAMUSCULAR | Status: DC

## 2013-08-20 MED ORDER — ONDANSETRON HCL 4 MG/2ML IJ SOLN
INTRAMUSCULAR | Status: AC
Start: 2013-08-20 — End: 2013-08-20
  Filled 2013-08-20: qty 2

## 2013-08-20 MED ORDER — MIDAZOLAM HCL 2 MG/2ML IJ SOLN
INTRAMUSCULAR | Status: AC | PRN
  Administered 2013-08-20 (×2): 1 mg via INTRAVENOUS

## 2013-08-20 MED ORDER — SODIUM CHLORIDE 0.9 % IV SOLN: INTRAVENOUS | Status: AC

## 2013-08-20 MED ORDER — FENTANYL CITRATE 0.05 MG/ML IJ SOLN
INTRAMUSCULAR | Status: AC | PRN
  Administered 2013-08-20 (×4): 25 ug via INTRAVENOUS

## 2013-08-20 MED ORDER — ONDANSETRON HCL 4 MG/2ML IJ SOLN
4.0000 mg | Freq: Once | INTRAMUSCULAR | Status: AC
  Administered 2013-08-20: 4 mg via INTRAVENOUS

## 2013-08-20 MED ORDER — HYDROMORPHONE HCL PF 1 MG/ML IJ SOLN
INTRAMUSCULAR | Status: AC
Start: 2013-08-20 — End: 2013-08-20
  Filled 2013-08-20: qty 2

## 2013-08-20 NOTE — Discharge Instructions (Signed)
No stooping,bending or lifting more than 10 lbs for 2 weeks. °Use Cayce Paschal to ambulate for 2 weeks. °RTC in 2 weeks ° °KYPHOPLASTY/VERTEBROPLASTY DISCHARGE INSTRUCTIONS ° °Medications: (check all that apply) ° °   Resume all home medications as before procedure. °   °                   °  °Continue your pain medications as prescribed as needed.  Over the next 3-5 days, decrease your pain medication as tolerated.  Over the counter medications (i.e. Tylenol, ibuprofen, and aleve) may be substituted once severe/moderate pain symptoms have subsided. ° ° Wound Care: °- Bandages may be removed the day following your procedure.  You may get your incision wet once bandages are removed.  Bandaids may be used to cover the incisions until scab formation.  Topical ointments are optional. ° °- If you develop a fever greater than 101 degrees, have increased skin redness at the incision sites or pus-like oozing from incisions occurring within 1 week of the procedure, contact radiology at [phone] or [phone]. ° °- Ice pack to back for 15-20 minutes 2-3 time per day for first 2-3 days post procedure.  The ice will expedite muscle healing and help with the pain from the incisions. ° ° Activity: °- Bedrest today with limited activity for 24 hours post procedure. ° °- No driving for 48 hours. ° °- Increase your activity as tolerated after bedrest (with assistance if necessary). ° °- Refrain from any strenuous activity or heavy lifting (greater than 10 lbs.). ° ° Follow up: °- Contact radiology at [phone] or [phone] if any questions/concerns. ° °- A physician assistant from radiology will contact you in approximately 1 week. ° °- If a biopsy was performed at the time of your procedure, your referring physician should receive the results in usually 2-3 days. ° ° ° ° ° ° ° ° °

## 2013-08-20 NOTE — Sedation Documentation (Signed)
Shivering, warm blankets applied.  Dr Estanislado Pandy at bedside explaining procedure.

## 2013-08-20 NOTE — Sedation Documentation (Signed)
In Rad nurses station, awaiting SS bed.  Family at bedside.

## 2013-08-20 NOTE — H&P (Signed)
Chief Complaint: "I'm here to have my back fractures fixed" Referring Physician:[place] HPI: [name] [name] is an 78 y.o. female with back pain, She had imaging workup which found acute L3 and L4 vertebral fractures. She was seen by Dr. Estanislado Pandy and is scheduled for VP/KP of these fractures. See IR Rad Eval under 'Imaging' section for full consult note. PMHx and meds reviewed. She feels ok except for back pain. No recent fevers, chills, illness.  Past Medical History:  Past Medical History  Diagnosis Date  . CAD (coronary artery disease)     LHC 2003 with 40% pLAD, 30% mLAD, 100% D1 (moderate vessel), 100%  D2 (small vessel), 95% mid small OM2.  Pt had PTCA to D1;  D2 and OM2 small vessels and tx medically;  Last Myoview (2012): anterior infarct seen with scar, no ischemia. EF normal. Pt managed medically;  Lexiscan Myoview (12/14):  Low risk; ant scar with very mild peri-infarct ischemia; EF 55% with ant AK   . Recurrent aspiration bronchitis/pneumonia     PFTs 12/09 FVC 97%, FEV1 101%, ratio 77%, TLC 109%. No significant obstruction or restriction.   . Hypertension   . Hyperlipidemia   . Esophageal stricture   . Diverticulosis of colon   . Gastroesophageal reflux disease with hiatal hernia   . Iron deficiency anemia   . Gastritis   . GERD (gastroesophageal reflux disease)   . Hiatal hernia   . Asthma   . Blood transfusion   . Cough   . Wheezing   . Weakness   . Trouble swallowing   . Abdominal pain   . Hiatal hernia   . Shortness of breath   . Anxiety   . Complication of anesthesia   . PONV (postoperative nausea and vomiting)   . TIA (transient ischemic attack)   . BRONCHITIS, ACUTE 07/25/2007         . DIVERTICULOSIS, COLON 08/28/2008    Qualifier: Diagnosis of  By: [name], [name], [name]    . IRON DEFICIENCY ANEMIA, HX OF 07/24/2007    Qualifier: Diagnosis of  By: Tilden Dome      Past Surgical History:  Past Surgical History  Procedure Laterality Date  . Back surgery     . Cholecystectomy    . Bladder repair    . Hemorrhoid surgery    . Cholecystectomy      Patient unsure of date.  [name] Appendectomy      patient unsure of date  . Total abdominal hysterectomy  1975    partial  . Coronary angioplasty  11/16/2001  . Eye surgery  11/2000    bilateral cataracts with lens implant  . Hiatal hernia repair  06/03/2011    Procedure: LAPAROSCOPIC REPAIR OF HIATAL HERNIA;  Surgeon: Adin Hector, MD;  Location: [facility];  Service: General;  Laterality: N/A;  . Laparoscopic nissen fundoplication  9/50/9326    Procedure: LAPAROSCOPIC NISSEN FUNDOPLICATION;  Surgeon: Adin Hector, MD;  Location: [facility];  Service: General;  Laterality: N/A;  . Lumbar laminectomy/decompression microdiscectomy Right 06/26/2012    Procedure: LUMBAR LAMINECTOMY/DECOMPRESSION MICRODISCECTOMY;  Surgeon: Jessy Oto, MD;  Location: [facility];  Service: Orthopedics;  Laterality: Right;  RIGHT L4-5 MICRODISCECTOMY  . Esophagogastroduodenoscopy N/A 03/15/2013    Procedure: ESOPHAGOGASTRODUODENOSCOPY (EGD);  Surgeon: Jerene Bears, MD;  Location: [facility];  Service: Endoscopy;  Laterality: N/A;  . Esophageal manometry N/A 03/25/2013    Procedure: ESOPHAGEAL MANOMETRY (EM);  Surgeon: Inda Castle, MD;  Location: [facility];  Service: Endoscopy;  Laterality: N/A;  . Hiatal hernia repair N/A 04/24/2013    Procedure: LAPAROSCOPIC  REPAIR RECURRENT PARASOPHAGEAL HIATAL HERNIA WITH FUNDOPLICATION;  Surgeon: Adin Hector, MD;  Location: [facility];  Service: General;  Laterality: N/A;  . Insertion of mesh N/A 04/24/2013    Procedure: INSERTION OF MESH;  Surgeon: Adin Hector, MD;  Location: [facility];  Service: General;  Laterality: N/A;  . Laparoscopic lysis of adhesions N/A 04/24/2013    Procedure: LAPAROSCOPIC LYSIS OF ADHESIONS;  Surgeon: Adin Hector, MD;  Location: [facility];  Service: General;  Laterality: N/A;    Family History:  Family History  Problem Relation Age of Onset  . Cancer  Mother     colon   . Colon cancer Mother 33  . Heart disease Father 53    heart attack    Social History:  reports that she has never smoked. She has never used smokeless tobacco. She reports that she does not drink alcohol or use illicit drugs.  Allergies:  Allergies  Allergen Reactions  . Aspartame Diarrhea and Nausea And Vomiting  . Daliresp [Roflumilast] Nausea And Vomiting  . Hydrocodone Nausea Only  . Oxycodone Nausea Only  . Phenergan [Promethazine Hcl] Other (See Comments)    "Doesn't work"  . Ace Inhibitors Cough    Per Dr Melvyn Novas pulmonology 2013    Medications:   Medication List    ASK your doctor about these medications       ALPRAZolam 0.5 MG tablet  Commonly known as:  XANAX  Take 0.5 mg by mouth 3 (three) times daily as needed for sleep or anxiety.     amLODipine 10 MG tablet  Commonly known as:  NORVASC  Take 10 mg by mouth every morning.     gabapentin 300 MG capsule  Commonly known as:  NEURONTIN  Take 1 capsule (300 mg total) by mouth 3 (three) times daily.     losartan 100 MG tablet  Commonly known as:  COZAAR  Take 100 mg by mouth daily.     metoCLOPramide 10 MG tablet  Commonly known as:  REGLAN  Take 5-10 mg by mouth 4 (four) times daily -  before meals and at bedtime.     metoprolol tartrate 25 MG tablet  Commonly known as:  LOPRESSOR  Take 12.5 mg by mouth at bedtime.     mometasone-formoterol 200-5 MCG/ACT Aero  Commonly known as:  DULERA  Inhale 2 puffs into the lungs 2 (two) times daily.     omeprazole 20 MG capsule  Commonly known as:  PRILOSEC  Take 1 capsule (20 mg total) by mouth daily.     ondansetron 4 MG disintegrating tablet  Commonly known as:  ZOFRAN ODT  Take 1 tablet (4 mg total) by mouth every 8 (eight) hours as needed for nausea or vomiting.     OVER THE COUNTER MEDICATION  Take 1,000 mg by mouth every other day. [place] Calcium 1000mg      OVER THE COUNTER MEDICATION  Take 400 mg by mouth 3 (three) times daily.  Magnesium Carbonate 400mg      pantoprazole 40 MG tablet  Commonly known as:  PROTONIX  Take 40 mg by mouth 2 (two) times daily.     polyethylene glycol powder powder  Commonly known as:  MIRALAX  Take 8.5-34 g by mouth 2 (two) times daily. To correct constipation.  Adjust dose over 1-2 months.  Goal = ~1 bowel movement / day  pravastatin 40 MG tablet  Commonly known as:  PRAVACHOL  Take 40 mg by mouth at bedtime.     promethazine 12.5 MG tablet  Commonly known as:  PHENERGAN  Take 1 tablet (12.5 mg total) by mouth every 6 (six) hours as needed for nausea or vomiting.     traMADol 50 MG tablet  Commonly known as:  ULTRAM  Take 1-2 tablets (50-100 mg total) by mouth every 6 (six) hours as needed for moderate pain or severe pain.     TUDORZA PRESSAIR 400 MCG/ACT Aepb  Generic drug:  Aclidinium Bromide  Inhale 1 puff into the lungs 2 (two) times daily as needed (wheezing).     VITAMIN B 12 PO  Take 1 tablet by mouth every morning.     Vitamin D3 2000 UNITS Tabs  Take 6,000 Units by mouth daily.        Please HPI for pertinent positives, otherwise complete 10 system ROS negative.  Physical Exam: BP 133/71  Pulse 68  Temp(Src) 98.2 F (36.8 C) (Oral)  Resp 18  Ht 5\' 5"  (1.651 m)  Wt 129 lb (58.514 kg)  BMI 21.47 kg/m2  SpO2 100% Body mass index is 21.47 kg/(m^2).   General Appearance:  Alert, cooperative, no distress, appears stated age  Head:  Normocephalic, without obvious abnormality, atraumatic  ENT: Unremarkable  Neck: Supple, symmetrical, trachea midline  Lungs:   Clear to auscultation bilaterally, no w/r/r, respirations unlabored without use of accessory muscles.  Chest Wall:  No tenderness or deformity  Heart:  Regular rate and rhythm, S1, S2 normal, no murmur, rub or gallop.  Abdomen:   Soft, non-tender, non distended.  Neurologic: Normal affect, no gross deficits.   No results found for this or any previous visit (from the past 48 hour(s)). No  results found.  Assessment/Plan L3 and L4 compression fractures For VP of L3 and KP of L4. Discussed procedure, risks, complications, use of sedation. Labs pending Consent signed in chart  Ascencion Dike PA-C 08/20/2013, 10:17 AM

## 2013-08-20 NOTE — Procedures (Signed)
S/P KP at L4  S/P VP at L3

## 2013-08-20 NOTE — Sedation Documentation (Signed)
O2 started 2l/[place] 

## 2013-08-20 NOTE — Sedation Documentation (Signed)
O2 d/c'd 

## 2013-08-20 NOTE — Sedation Documentation (Signed)
SS ready.  Tolerated procedure well.  Taking po's.  VSS

## 2013-08-26 ENCOUNTER — Other Ambulatory Visit: Payer: Medicare Other

## 2013-08-28 ENCOUNTER — Ambulatory Visit: Payer: Medicare Other | Admitting: Internal Medicine

## 2013-08-28 ENCOUNTER — Other Ambulatory Visit: Payer: Self-pay | Admitting: Internal Medicine

## 2013-09-03 ENCOUNTER — Telehealth: Payer: Self-pay | Admitting: *Deleted

## 2013-09-03 ENCOUNTER — Other Ambulatory Visit (HOSPITAL_COMMUNITY): Payer: Self-pay | Admitting: Interventional Radiology

## 2013-09-03 DIAGNOSIS — IMO0002 Reserved for concepts with insufficient information to code with codable children: Secondary | ICD-10-CM

## 2013-09-03 DIAGNOSIS — M549 Dorsalgia, unspecified: Secondary | ICD-10-CM

## 2013-09-03 NOTE — Telephone Encounter (Signed)
Patient called and states she does not have an appetite and is losing more weight.  Per Dr Melford Aase, no appetite could be caused by an ulcer or gastritis and needs a referral to Dr Erskine Emery.  Patient aware and appointment will be made.

## 2013-09-05 ENCOUNTER — Ambulatory Visit (HOSPITAL_COMMUNITY)
Admission: RE | Admit: 2013-09-05 | Discharge: 2013-09-05 | Disposition: A | Discharge status: Home / Self Care | Payer: Medicare HMO | Source: Ambulatory Visit | Attending: Interventional Radiology | Admitting: Interventional Radiology

## 2013-09-05 DIAGNOSIS — IMO0002 Reserved for concepts with insufficient information to code with codable children: Secondary | ICD-10-CM

## 2013-09-05 DIAGNOSIS — M549 Dorsalgia, unspecified: Secondary | ICD-10-CM

## 2013-09-10 ENCOUNTER — Ambulatory Visit (HOSPITAL_COMMUNITY)
Admission: RE | Admit: 2013-09-10 | Discharge: 2013-09-10 | Disposition: A | Discharge status: Home / Self Care | Payer: Medicare HMO | Source: Ambulatory Visit | Attending: Interventional Radiology | Admitting: Interventional Radiology

## 2013-09-10 ENCOUNTER — Encounter: Payer: Self-pay | Admitting: Physician Assistant

## 2013-09-10 ENCOUNTER — Ambulatory Visit: Payer: Self-pay | Admitting: Internal Medicine

## 2013-09-10 ENCOUNTER — Ambulatory Visit (INDEPENDENT_AMBULATORY_CARE_PROVIDER_SITE_OTHER): Payer: Medicare HMO | Admitting: Physician Assistant

## 2013-09-10 VITALS — BP 120/62 | HR 80 | Temp 97.9°F | Resp 16 | Wt 125.0 lb

## 2013-09-10 DIAGNOSIS — J209 Acute bronchitis, unspecified: Secondary | ICD-10-CM

## 2013-09-10 MED ORDER — PREDNISONE 20 MG PO TABS: ORAL_TABLET | ORAL | Status: DC

## 2013-09-10 MED ORDER — LEVOFLOXACIN 500 MG PO TABS: 500.0000 mg | ORAL_TABLET | Freq: Every day | ORAL | Status: DC

## 2013-09-10 NOTE — Patient Instructions (Signed)

## 2013-09-10 NOTE — Progress Notes (Addendum)
   Subjective:    Patient ID: [name] [name], female    DOB: [birth year], 78 y.o.   MRN: [id number]  HPI 78 y.o. presents with right shoulder blade pain and cough with productive thick green mucus with SOB, nausea, with SOB.   Review of Systems  Constitutional: Positive for fatigue. Negative for fever and chills.  HENT: Positive for congestion, rhinorrhea, sinus pressure and sore throat. Negative for dental problem, ear discharge, ear pain, nosebleeds, trouble swallowing and voice change.   Respiratory: Positive for cough, chest tightness, shortness of breath and wheezing.   Cardiovascular: Negative.   Gastrointestinal: Negative.   Genitourinary: Negative.   Musculoskeletal: Negative.   Neurological: Negative.        Objective:   Physical Exam  Constitutional: She is oriented to person, place, and time. She appears well-developed and well-nourished.  HENT:  Head: Normocephalic and atraumatic.  Right Ear: External ear normal.  Left Ear: External ear normal.  Nose: Nose normal.  Mouth/Throat: Oropharynx is clear and moist.  Eyes: Conjunctivae are normal. Pupils are equal, round, and reactive to light.  Neck: Normal range of motion. Neck supple.  Cardiovascular: Normal rate and regular rhythm.   Pulmonary/Chest: Effort normal. No respiratory distress. She has wheezes (RML). She has no rales. She exhibits no tenderness.  Abdominal: Soft. Bowel sounds are normal.  Lymphadenopathy:    She has no cervical adenopathy.  Neurological: She is alert and oriented to person, place, and time.  Skin: Skin is warm and dry.      Assessment & Plan:  1. Acute bronchitis Cont duoneb at home, given dulera samples, if worsening symptoms go to ER - levofloxacin (LEVAQUIN) 500 MG tablet; Take 1 tablet (500 mg total) by mouth daily.  Dispense: 10 tablet; Refill: 0 - predniSONE (DELTASONE) 20 MG tablet; Take one pill two times daily for 3 days, take one pill daily for 4 days.  Dispense: 10 tablet;  Refill: 0  Patient called back and states that she can not tolerate Levaquin, will do doxycycline.

## 2013-09-11 MED ORDER — DOXYCYCLINE HYCLATE 100 MG PO TABS: 100.0000 mg | ORAL_TABLET | Freq: Two times a day (BID) | ORAL | Status: DC

## 2013-09-11 NOTE — Addendum Note (Signed)
Addended by: Vicie Mutters R on: 09/11/2013 10:02 AM   Modules accepted: Orders

## 2013-09-20 ENCOUNTER — Ambulatory Visit (INDEPENDENT_AMBULATORY_CARE_PROVIDER_SITE_OTHER): Payer: Medicare HMO | Admitting: Internal Medicine

## 2013-09-20 ENCOUNTER — Encounter: Payer: Self-pay | Admitting: Internal Medicine

## 2013-09-20 VITALS — BP 118/62 | HR 70 | Temp 98.2°F | Resp 16 | Ht 63.75 in | Wt 123.0 lb

## 2013-09-20 DIAGNOSIS — R7303 Prediabetes: Secondary | ICD-10-CM

## 2013-09-20 DIAGNOSIS — E559 Vitamin D deficiency, unspecified: Secondary | ICD-10-CM

## 2013-09-20 DIAGNOSIS — R7309 Other abnormal glucose: Secondary | ICD-10-CM

## 2013-09-20 DIAGNOSIS — E538 Deficiency of other specified B group vitamins: Secondary | ICD-10-CM

## 2013-09-20 DIAGNOSIS — J0410 Acute tracheitis without obstruction: Secondary | ICD-10-CM

## 2013-09-20 DIAGNOSIS — R110 Nausea: Secondary | ICD-10-CM

## 2013-09-20 DIAGNOSIS — R5381 Other malaise: Secondary | ICD-10-CM

## 2013-09-20 DIAGNOSIS — Z79899 Other long term (current) drug therapy: Secondary | ICD-10-CM

## 2013-09-20 DIAGNOSIS — R5383 Other fatigue: Secondary | ICD-10-CM

## 2013-09-20 LAB — HEMOGLOBIN A1C
Hgb A1c MFr Bld: 6.1 % — ABNORMAL HIGH (ref <5.7)
Mean Plasma Glucose: 128 mg/dL — ABNORMAL HIGH (ref <117)

## 2013-09-20 LAB — CBC WITH DIFFERENTIAL/PLATELET
BASOS PCT: 0 % (ref 0–1)
Basophils Absolute: 0 10*3/uL (ref 0.0–0.1)
EOS ABS: 0.2 10*3/uL (ref 0.0–0.7)
Eosinophils Relative: 2 % (ref 0–5)
HEMATOCRIT: 41.4 % (ref 36.0–46.0)
Hemoglobin: 13.9 g/dL (ref 12.0–15.0)
Lymphocytes Relative: 28 % (ref 12–46)
Lymphs Abs: 2.3 10*3/uL (ref 0.7–4.0)
MCH: 29.6 pg (ref 26.0–34.0)
MCHC: 33.6 g/dL (ref 30.0–36.0)
MCV: 88.1 fL (ref 78.0–100.0)
MONO ABS: 0.9 10*3/uL (ref 0.1–1.0)
Monocytes Relative: 11 % (ref 3–12)
Neutro Abs: 4.9 10*3/uL (ref 1.7–7.7)
Neutrophils Relative %: 59 % (ref 43–77)
Platelets: 373 10*3/uL (ref 150–400)
RBC: 4.7 MIL/uL (ref 3.87–5.11)
RDW: 14 % (ref 11.5–15.5)
WBC: 8.3 10*3/uL (ref 4.0–10.5)

## 2013-09-20 MED ORDER — ONDANSETRON 8 MG PO TBDP: 8.0000 mg | ORAL_TABLET | Freq: Three times a day (TID) | ORAL | Status: DC | PRN

## 2013-09-20 MED ORDER — ALPRAZOLAM 0.5 MG PO TABS: 0.5000 mg | ORAL_TABLET | Freq: Three times a day (TID) | ORAL | Status: DC | PRN

## 2013-09-20 MED ORDER — METOCLOPRAMIDE HCL 10 MG PO TABS: 5.0000 mg | ORAL_TABLET | Freq: Three times a day (TID) | ORAL | Status: DC

## 2013-09-20 NOTE — Patient Instructions (Signed)
Chronic Fatigue Syndrome Chronic Fatigue Syndrome is characterized by extreme fatigue that does not improve with rest. The cause of this condition is unknown.  SYMPTOMS  An unexplained dramatic loss of energy.  Muscle or joint soreness.  Severe weakness.  Frequent headaches.  Fever, sore throat, and swollen lymph glands.  Sleep problems.  Inability to concentrate. Symptoms must usually be present for over 6 months before the diagnosis of chronic fatigue can be made. There is no diagnostic test for this disease. Many other diseases can cause similar symptoms. A complete medical evaluation is needed to be sure you do not have other medical problems causing your symptoms. There is no specific treatment for Chronic Fatigue Syndrome. Cognitive behavioral therapy (similar to counseling) and/or a simple exercise regimen may be beneficial. Get plenty of rest and avoid alcohol and other depressant drugs. Call your caregiver for follow up care as recommended. Document Released: 06/02/2004 Document Revised: 07/18/2011 Document Reviewed: 07/25/2008 [facility] Patient Information 2014 [place], [place]. Fatigue Fatigue is a feeling of tiredness, lack of energy, lack of motivation, or feeling tired all the time. Having enough rest, good nutrition, and reducing stress will normally reduce fatigue. Consult your caregiver if it persists. The nature of your fatigue will help your caregiver to find out its cause. The treatment is based on the cause.  CAUSES  There are many causes for fatigue. Most of the time, fatigue can be traced to one or more of your habits or routines. Most causes fit into one or more of three general areas. They are: Lifestyle problems  Sleep disturbances.  Overwork.  Physical exertion.  Unhealthy habits.  Poor eating habits or eating disorders.  Alcohol and/or drug use .  Lack of proper nutrition (malnutrition). Psychological problems  Stress and/or anxiety  problems.  Depression.  Grief.  Boredom. Medical Problems or Conditions  Anemia.  Pregnancy.  Thyroid gland problems.  Recovery from major surgery.  Continuous pain.  Emphysema or asthma that is not well controlled  Allergic conditions.  Diabetes.  Infections (such as mononucleosis).  Obesity.  Sleep disorders, such as sleep apnea.  Heart failure or other heart-related problems.  Cancer.  Kidney disease.  Liver disease.  Effects of certain medicines such as antihistamines, cough and cold remedies, prescription pain medicines, heart and blood pressure medicines, drugs used for treatment of cancer, and some antidepressants. SYMPTOMS  The symptoms of fatigue include:   Lack of energy.  Lack of drive (motivation).  Drowsiness.  Feeling of indifference to the surroundings. DIAGNOSIS  The details of how you feel help guide your caregiver in finding out what is causing the fatigue. You will be asked about your present and past health condition. It is important to review all medicines that you take, including prescription and non-prescription items. A thorough exam will be done. You will be questioned about your feelings, habits, and normal lifestyle. Your caregiver may suggest blood tests, urine tests, or other tests to look for common medical causes of fatigue.  TREATMENT  Fatigue is treated by correcting the underlying cause. For example, if you have continuous pain or depression, treating these causes will improve how you feel. Similarly, adjusting the dose of certain medicines will help in reducing fatigue.  HOME CARE INSTRUCTIONS   Try to get the required amount of good sleep every night.  Eat a healthy and nutritious diet, and drink enough water throughout the day.  Practice ways of relaxing (including yoga or meditation).  Exercise regularly.  Make plans to change  situations that cause stress. Act on those plans so that stresses decrease over time. Keep  your work and personal routine reasonable.  Avoid street drugs and minimize use of alcohol.  Start taking a daily multivitamin after consulting your caregiver. SEEK MEDICAL CARE IF:   You have persistent tiredness, which cannot be accounted for.  You have fever.  You have unintentional weight loss.  You have headaches.  You have disturbed sleep throughout the night.  You are feeling sad.  You have constipation.  You have dry skin.  You have gained weight.  You are taking any new or different medicines that you suspect are causing fatigue.  You are unable to sleep at night.  You develop any unusual swelling of your legs or other parts of your body. SEEK IMMEDIATE MEDICAL CARE IF:   You are feeling confused.  Your vision is blurred.  You feel faint or pass out.  You develop severe headache.  You develop severe abdominal, pelvic, or back pain.  You develop chest pain, shortness of breath, or an irregular or fast heartbeat.  You are unable to pass a normal amount of urine.  You develop abnormal bleeding such as bleeding from the rectum or you vomit blood.  You have thoughts about harming yourself or committing suicide.  You are worried that you might harm someone else. MAKE SURE YOU:   Understand these instructions.  Will watch your condition.  Will get help right away if you are not doing well or get worse. Document Released: 02/20/2007 Document Revised: 07/18/2011 Document Reviewed: 02/20/2007 [facility] Patient Information 2014 [place].

## 2013-09-20 NOTE — Progress Notes (Signed)
Subjective:    Patient ID: [name] [name], female    DOB: 1935-10-18, 78 y.o.   MRN: [id number]  HPI  Patient presents with c/o dry cough  And occasional nausea. Was seen 2 weeks ago for bronchitis and given Rx Levaquin and prednisone and developed nausea & was switched to Doxycycline and still has occas dry cough - occas. a small amt of "greenish" mucus - No fever, chills, sweats, dyspnea or diaphoresis.  Current Outpatient Prescriptions on File Prior to Visit  Medication Sig Dispense Refill  . Aclidinium Bromide (TUDORZA PRESSAIR) 400 MCG/ACT AEPB Inhale 1 puff into the lungs 2 (two) times daily as needed (wheezing).       [name] amLODipine (NORVASC) 10 MG tablet Take 10 mg by mouth every morning.       . Cholecalciferol (VITAMIN D3) 2000 UNITS TABS Take 6,000 Units by mouth daily.       . Cyanocobalamin (VITAMIN B 12 PO) Take 1 tablet by mouth every morning.       [name] doxycycline (VIBRA-TABS) 100 MG tablet Take 1 tablet (100 mg total) by mouth 2 (two) times daily.  20 tablet  0  . gabapentin (NEURONTIN) 300 MG capsule Take 1 capsule (300 mg total) by mouth 3 (three) times daily.  90 capsule  5  . losartan (COZAAR) 100 MG tablet Take 100 mg by mouth daily.      . metoprolol tartrate (LOPRESSOR) 25 MG tablet Take 12.5 mg by mouth at bedtime.      . mometasone-formoterol (DULERA) 200-5 MCG/ACT AERO Inhale 2 puffs into the lungs 2 (two) times daily.  13 g  3  . omeprazole (PRILOSEC) 20 MG capsule Take 1 capsule (20 mg total) by mouth daily.  90 capsule  3  . OVER THE COUNTER MEDICATION Take 1,000 mg by mouth every other day. Coral Calcium 1000mg       . OVER THE COUNTER MEDICATION Take 400 mg by mouth 3 (three) times daily. Magnesium Carbonate 400mg       . pantoprazole (PROTONIX) 40 MG tablet TAKE ONE TABLET BY MOUTH TWICE DAILY FOR STOMACH  60 tablet  6  . polyethylene glycol powder (MIRALAX) powder Take 8.5-34 g by mouth 2 (two) times daily. To correct constipation.  Adjust dose over 1-2 months.  Goal  = ~1 bowel movement / day  255 g  5  . pravastatin (PRAVACHOL) 40 MG tablet Take 40 mg by mouth at bedtime.       . promethazine (PHENERGAN) 12.5 MG tablet Take 1 tablet (12.5 mg total) by mouth every 6 (six) hours as needed for nausea or vomiting.  20 tablet  5  . traMADol (ULTRAM) 50 MG tablet Take 1-2 tablets (50-100 mg total) by mouth every 6 (six) hours as needed for moderate pain or severe pain.  40 tablet  0   No current facility-administered medications on file prior to visit.   Allergies  Allergen Reactions  . Aspartame Diarrhea and Nausea And Vomiting  . Daliresp [Roflumilast] Nausea And Vomiting  . Hydrocodone Nausea Only  . Oxycodone Nausea Only  . Phenergan [Promethazine Hcl] Other (See Comments)    "Doesn't work"  . Ace Inhibitors Cough    Per Dr Melvyn Novas pulmonology 2013    Past Medical History  Diagnosis Date  . CAD (coronary artery disease)     LHC 2003 with 40% pLAD, 30% mLAD, 100% D1 (moderate vessel), 100%  D2 (small vessel), 95% mid small OM2.  Pt had PTCA to D1;  D2 and OM2 small vessels and tx medically;  Last Myoview (2012): anterior infarct seen with scar, no ischemia. EF normal. Pt managed medically;  Lexiscan Myoview (12/14):  Low risk; ant scar with very mild peri-infarct ischemia; EF 55% with ant AK   . Recurrent aspiration bronchitis/pneumonia     PFTs 12/09 FVC 97%, FEV1 101%, ratio 77%, TLC 109%. No significant obstruction or restriction.   . Hypertension   . Hyperlipidemia   . Esophageal stricture   . Diverticulosis of colon   . Gastroesophageal reflux disease with hiatal hernia   . Iron deficiency anemia   . Gastritis   . GERD (gastroesophageal reflux disease)   . Hiatal hernia   . Asthma   . Blood transfusion   . Cough   . Wheezing   . Weakness   . Trouble swallowing   . Abdominal pain   . Hiatal hernia   . Shortness of breath   . Anxiety   . Complication of anesthesia   . PONV (postoperative nausea and vomiting)   . TIA (transient ischemic  attack)   . BRONCHITIS, ACUTE 07/25/2007         . DIVERTICULOSIS, COLON 08/28/2008    Qualifier: Diagnosis of  By: [name], [name], [name]    . IRON DEFICIENCY ANEMIA, HX OF 07/24/2007    Qualifier: Diagnosis of  By: Tilden Dome     Review of Systems In addition to the HPI above,  No Fever-chills,  No Headache, No changes with Vision or hearing,  No problems swallowing food or Liquids,  No Abdominal pain, No Nausea or Vommitting, Bowel movements are regular,  No Blood in stool or Urine,  No dysuria,  No new skin rashes or bruises,  No new joints pains-aches,  No new weakness, tingling, numbness in any extremity,  No recent weight loss,  No polyuria, polydypsia or polyphagia,  No significant Mental Stressors.  A full 10 point Review of Systems was done, except as stated above, all other Review of Systems were negative  Objective:   Physical Exam  BP 118/62  Pulse 70  Temp(Src) 98.2 F (36.8 C) (Temporal)  Resp 16  Ht 5' 3.75" (1.619 m)  Wt 123 lb (55.792 kg)  BMI 21.29 kg/m2  SpO2 98%  HEENT - Eac's patent. TM's Nl.EOM's full. PERRLA. NasoOroPharynx clear. Neck - supple. Nl Thyroid. No bruits nodes JVD Chest - Clear equal BS Cor - Nl HS. RRR w/o sig MGR. PP 1(+) No edema. Abd - Benign. MS- FROM. w/o deformities. Muscle power tone and bulk Nl. Gait Nl. Neuro - No obvious Cr N abnormalities. Sensory, motor and Cerebellar functions appear Nl w/o focal abnormalities.  Assessment & Plan:   1. Acute tracheitis, resolving - advised to retry her Rx Levaquin 500 mg and take just 1/2 tab qd after her largest meal of the day, But advised pt and husb that her persistant cough is most likely the resolving phase of her lung infection. Suggested she try "Mucus Relief" 400 mg x 2 tab = 800 mg tid pc  2. Prediabetes - r/o exascerbation after steroids - Hemoglobin A1c - Insulin, fasting  3. Malaise and fatigue - r/o medical cause of her fatigue  - TSH - CBC with Differential -  BASIC METABOLIC PANEL WITH GFR - Hepatic function panel - Magnesium - Vitamin B12  4. Nausea - refill her Zofran, Reglan, as well as her Xanax - ondansetron (ZOFRAN ODT) 8 MG disintegrating tablet; Take 1 tablet (8 mg total) by  mouth every 8 (eight) hours as needed for nausea or vomiting. For nausea  Dispense: 30 tablet; Refill: 5

## 2013-09-21 LAB — HEPATIC FUNCTION PANEL
ALT: 49 U/L — ABNORMAL HIGH (ref 0–35)
AST: 22 U/L (ref 0–37)
Albumin: 4.2 g/dL (ref 3.5–5.2)
Alkaline Phosphatase: 76 U/L (ref 39–117)
BILIRUBIN INDIRECT: 0.5 mg/dL (ref 0.2–1.2)
Bilirubin, Direct: 0.2 mg/dL (ref 0.0–0.3)
TOTAL PROTEIN: 7 g/dL (ref 6.0–8.3)
Total Bilirubin: 0.7 mg/dL (ref 0.2–1.2)

## 2013-09-21 LAB — BASIC METABOLIC PANEL WITH GFR
BUN: 14 mg/dL (ref 6–23)
CALCIUM: 9.8 mg/dL (ref 8.4–10.5)
CO2: 27 meq/L (ref 19–32)
Chloride: 101 mEq/L (ref 96–112)
Creat: 0.76 mg/dL (ref 0.50–1.10)
GFR, EST AFRICAN AMERICAN: 89 mL/min
GFR, Est Non African American: 77 mL/min
Glucose, Bld: 87 mg/dL (ref 70–99)
POTASSIUM: 4.8 meq/L (ref 3.5–5.3)
Sodium: 142 mEq/L (ref 135–145)

## 2013-09-21 LAB — TSH: TSH: 0.962 u[IU]/mL (ref 0.350–4.500)

## 2013-09-21 LAB — MAGNESIUM: Magnesium: 2.1 mg/dL (ref 1.5–2.5)

## 2013-09-21 LAB — VITAMIN B12: Vitamin B-12: 1548 pg/mL — ABNORMAL HIGH (ref 211–911)

## 2013-09-21 LAB — INSULIN, FASTING: Insulin fasting, serum: 32 u[IU]/mL — ABNORMAL HIGH (ref 3–28)

## 2013-09-22 ENCOUNTER — Other Ambulatory Visit: Payer: Self-pay | Admitting: Internal Medicine

## 2013-09-22 MED ORDER — METFORMIN HCL ER 500 MG PO TB24: ORAL_TABLET | ORAL | Status: DC

## 2013-09-23 ENCOUNTER — Other Ambulatory Visit: Payer: Self-pay

## 2013-09-23 ENCOUNTER — Telehealth: Payer: Self-pay

## 2013-09-23 NOTE — Telephone Encounter (Signed)
Message copied by Nadyne Coombes on Mon Sep 23, 2013 12:56 PM ------      Message from: [name]      Created: Sun Sep 22, 2013  5:31 PM       CBC nl      B12 is super high - can cut out any B12 supplements      Kidneys/electrolytes liver Mag Liver - all nl/ok      Only thing wrong is A1c is 6.1% -  Recc start Metformin       To lower sugar & help lose weight       Rx sent to [facility] for Metformin &      Start just 1 tab 2 x da with the 2 largest meals of the day ------

## 2013-09-23 NOTE — Telephone Encounter (Signed)
lmom to pt to return my call for lab results. 

## 2013-09-27 ENCOUNTER — Emergency Department (HOSPITAL_COMMUNITY): Payer: Medicare HMO

## 2013-09-27 ENCOUNTER — Encounter (HOSPITAL_COMMUNITY): Payer: Self-pay | Admitting: Emergency Medicine

## 2013-09-27 ENCOUNTER — Emergency Department (HOSPITAL_COMMUNITY)
Admission: EM | Admit: 2013-09-27 | Discharge: 2013-09-27 | Disposition: A | Discharge status: Home / Self Care | Payer: Medicare HMO | Attending: Emergency Medicine | Admitting: Emergency Medicine

## 2013-09-27 DIAGNOSIS — I2510 Atherosclerotic heart disease of native coronary artery without angina pectoris: Secondary | ICD-10-CM | POA: Insufficient documentation

## 2013-09-27 DIAGNOSIS — R109 Unspecified abdominal pain: Secondary | ICD-10-CM | POA: Insufficient documentation

## 2013-09-27 DIAGNOSIS — J45909 Unspecified asthma, uncomplicated: Secondary | ICD-10-CM | POA: Insufficient documentation

## 2013-09-27 DIAGNOSIS — Z862 Personal history of diseases of the blood and blood-forming organs and certain disorders involving the immune mechanism: Secondary | ICD-10-CM | POA: Insufficient documentation

## 2013-09-27 DIAGNOSIS — Z9861 Coronary angioplasty status: Secondary | ICD-10-CM | POA: Insufficient documentation

## 2013-09-27 DIAGNOSIS — K219 Gastro-esophageal reflux disease without esophagitis: Secondary | ICD-10-CM | POA: Insufficient documentation

## 2013-09-27 DIAGNOSIS — I10 Essential (primary) hypertension: Secondary | ICD-10-CM | POA: Insufficient documentation

## 2013-09-27 DIAGNOSIS — Z8673 Personal history of transient ischemic attack (TIA), and cerebral infarction without residual deficits: Secondary | ICD-10-CM | POA: Insufficient documentation

## 2013-09-27 DIAGNOSIS — R197 Diarrhea, unspecified: Secondary | ICD-10-CM | POA: Insufficient documentation

## 2013-09-27 DIAGNOSIS — Z9889 Other specified postprocedural states: Secondary | ICD-10-CM | POA: Insufficient documentation

## 2013-09-27 DIAGNOSIS — F411 Generalized anxiety disorder: Secondary | ICD-10-CM | POA: Insufficient documentation

## 2013-09-27 DIAGNOSIS — R110 Nausea: Secondary | ICD-10-CM | POA: Insufficient documentation

## 2013-09-27 DIAGNOSIS — Z9089 Acquired absence of other organs: Secondary | ICD-10-CM | POA: Insufficient documentation

## 2013-09-27 DIAGNOSIS — E785 Hyperlipidemia, unspecified: Secondary | ICD-10-CM | POA: Insufficient documentation

## 2013-09-27 LAB — CBC WITH DIFFERENTIAL/PLATELET
BASOS PCT: 0 % (ref 0–1)
Basophils Absolute: 0 10*3/uL (ref 0.0–0.1)
Eosinophils Absolute: 0.2 10*3/uL (ref 0.0–0.7)
Eosinophils Relative: 2 % (ref 0–5)
HEMATOCRIT: 38.1 % (ref 36.0–46.0)
HEMOGLOBIN: 12.3 g/dL (ref 12.0–15.0)
LYMPHS ABS: 1.6 10*3/uL (ref 0.7–4.0)
LYMPHS PCT: 21 % (ref 12–46)
MCH: 28.8 pg (ref 26.0–34.0)
MCHC: 32.3 g/dL (ref 30.0–36.0)
MCV: 89.2 fL (ref 78.0–100.0)
MONO ABS: 0.5 10*3/uL (ref 0.1–1.0)
MONOS PCT: 7 % (ref 3–12)
NEUTROS PCT: 70 % (ref 43–77)
Neutro Abs: 5.4 10*3/uL (ref 1.7–7.7)
Platelets: 317 10*3/uL (ref 150–400)
RBC: 4.27 MIL/uL (ref 3.87–5.11)
RDW: 14.1 % (ref 11.5–15.5)
WBC: 7.8 10*3/uL (ref 4.0–10.5)

## 2013-09-27 LAB — URINALYSIS, ROUTINE W REFLEX MICROSCOPIC
BILIRUBIN URINE: NEGATIVE
Glucose, UA: NEGATIVE mg/dL
Hgb urine dipstick: NEGATIVE
Ketones, ur: 15 mg/dL — AB
Leukocytes, UA: NEGATIVE
Nitrite: NEGATIVE
PH: 6.5 (ref 5.0–8.0)
PROTEIN: NEGATIVE mg/dL
Specific Gravity, Urine: 1.024 (ref 1.005–1.030)
Urobilinogen, UA: 1 mg/dL (ref 0.0–1.0)

## 2013-09-27 LAB — COMPREHENSIVE METABOLIC PANEL
ALBUMIN: 3.9 g/dL (ref 3.5–5.2)
ALK PHOS: 83 U/L (ref 39–117)
ALT: 17 U/L (ref 0–35)
AST: 14 U/L (ref 0–37)
BUN: 15 mg/dL (ref 6–23)
CHLORIDE: 101 meq/L (ref 96–112)
CO2: 26 mEq/L (ref 19–32)
CREATININE: 0.72 mg/dL (ref 0.50–1.10)
Calcium: 9.6 mg/dL (ref 8.4–10.5)
GFR calc Af Amer: >90 mL/min (ref >90)
GFR, EST NON AFRICAN AMERICAN: 82 mL/min — AB (ref >90)
GLUCOSE: 88 mg/dL (ref 70–99)
POTASSIUM: 3.8 meq/L (ref 3.7–5.3)
Sodium: 140 mEq/L (ref 137–147)
Total Bilirubin: 0.4 mg/dL (ref 0.3–1.2)
Total Protein: 7.1 g/dL (ref 6.0–8.3)

## 2013-09-27 LAB — LIPASE, BLOOD: Lipase: 11 U/L (ref 11–59)

## 2013-09-27 MED ORDER — ONDANSETRON HCL 4 MG/2ML IJ SOLN
4.0000 mg | Freq: Once | INTRAMUSCULAR | Status: AC
  Administered 2013-09-27: 4 mg via INTRAVENOUS
  Filled 2013-09-27: qty 2

## 2013-09-27 MED ORDER — IOHEXOL 300 MG/ML  SOLN
25.0000 mL | INTRAMUSCULAR | Status: AC
  Administered 2013-09-27: 25 mL via ORAL

## 2013-09-27 MED ORDER — SODIUM CHLORIDE 0.9 % IV SOLN
Freq: Once | INTRAVENOUS | Status: AC
  Administered 2013-09-27: 17:00:00 via INTRAVENOUS

## 2013-09-27 MED ORDER — IOHEXOL 300 MG/ML  SOLN
100.0000 mL | Freq: Once | INTRAMUSCULAR | Status: AC | PRN
  Administered 2013-09-27: 100 mL via INTRAVENOUS

## 2013-09-27 NOTE — ED Provider Notes (Signed)
CSN: [id number]     Arrival date & time 09/27/13  1429 History   First MD Initiated Contact with Patient 09/27/13 1637     Chief Complaint  Patient presents with  . Emesis  . Diarrhea     (Consider location/radiation/quality/duration/timing/severity/associated sxs/prior Treatment) HPI  [name] [name] is a 78 y.o. female with past medical history significant for coronary artery disease, recurrent aspiration pneumonia, hiatal hernia status post repair, diverticulosis complaining of multiple episodes of watery diarrhea onset at 10 AM today associated with a 7/10 lower abdominal pain, nausea and chills. He denies fever, emesis, chest pain, shortness of breath, lightheaded sensation, palpitations, change in urination. Patient took Reglan at home with little relief. She had a vertebroplasty on April 16. Patient was started on Levaquin and prednisone for bronchitis last week. She states she only took a few days of the medications because it made her nauseous. Last intake was Monday (5 days ago).   Past Medical History  Diagnosis Date  . CAD (coronary artery disease)     LHC 2003 with 40% pLAD, 30% mLAD, 100% D1 (moderate vessel), 100%  D2 (small vessel), 95% mid small OM2.  Pt had PTCA to D1;  D2 and OM2 small vessels and tx medically;  Last Myoview (2012): anterior infarct seen with scar, no ischemia. EF normal. Pt managed medically;  Lexiscan Myoview (12/14):  Low risk; ant scar with very mild peri-infarct ischemia; EF 55% with ant AK   . Recurrent aspiration bronchitis/pneumonia     PFTs 12/09 FVC 97%, FEV1 101%, ratio 77%, TLC 109%. No significant obstruction or restriction.   . Hypertension   . Hyperlipidemia   . Esophageal stricture   . Diverticulosis of colon   . Gastroesophageal reflux disease with hiatal hernia   . Iron deficiency anemia   . Gastritis   . GERD (gastroesophageal reflux disease)   . Hiatal hernia   . Asthma   . Blood transfusion   . Cough   . Wheezing   . Weakness    . Trouble swallowing   . Abdominal pain   . Hiatal hernia   . Shortness of breath   . Anxiety   . Complication of anesthesia   . PONV (postoperative nausea and vomiting)   . TIA (transient ischemic attack)   . BRONCHITIS, ACUTE 07/25/2007         . DIVERTICULOSIS, COLON 08/28/2008    Qualifier: Diagnosis of  By: [name], [name], [name]    . IRON DEFICIENCY ANEMIA, HX OF 07/24/2007    Qualifier: Diagnosis of  By: Tilden Dome     Past Surgical History  Procedure Laterality Date  . Back surgery    . Cholecystectomy    . Bladder repair    . Hemorrhoid surgery    . Cholecystectomy      Patient unsure of date.  [name] Appendectomy      patient unsure of date  . Total abdominal hysterectomy  1975    partial  . Coronary angioplasty  11/16/2001  . Eye surgery  11/2000    bilateral cataracts with lens implant  . Hiatal hernia repair  06/03/2011    Procedure: LAPAROSCOPIC REPAIR OF HIATAL HERNIA;  Surgeon: Adin Hector, MD;  Location: [facility];  Service: General;  Laterality: N/A;  . Laparoscopic nissen fundoplication  [id number]    Procedure: LAPAROSCOPIC NISSEN FUNDOPLICATION;  Surgeon: Adin Hector, MD;  Location: [facility];  Service: General;  Laterality: N/A;  . Lumbar laminectomy/decompression microdiscectomy Right 06/26/2012  Procedure: LUMBAR LAMINECTOMY/DECOMPRESSION MICRODISCECTOMY;  Surgeon: Jessy Oto, MD;  Location: [facility];  Service: Orthopedics;  Laterality: Right;  RIGHT L4-5 MICRODISCECTOMY  . Esophagogastroduodenoscopy N/A 03/15/2013    Procedure: ESOPHAGOGASTRODUODENOSCOPY (EGD);  Surgeon: Jerene Bears, MD;  Location: [facility];  Service: Endoscopy;  Laterality: N/A;  . Esophageal manometry N/A 03/25/2013    Procedure: ESOPHAGEAL MANOMETRY (EM);  Surgeon: Inda Castle, MD;  Location: [facility];  Service: Endoscopy;  Laterality: N/A;  . Hiatal hernia repair N/A 04/24/2013    Procedure: LAPAROSCOPIC  REPAIR RECURRENT PARASOPHAGEAL HIATAL HERNIA WITH FUNDOPLICATION;   Surgeon: Adin Hector, MD;  Location: [facility];  Service: General;  Laterality: N/A;  . Insertion of mesh N/A 04/24/2013    Procedure: INSERTION OF MESH;  Surgeon: Adin Hector, MD;  Location: [facility];  Service: General;  Laterality: N/A;  . Laparoscopic lysis of adhesions N/A 04/24/2013    Procedure: LAPAROSCOPIC LYSIS OF ADHESIONS;  Surgeon: Adin Hector, MD;  Location: [facility];  Service: General;  Laterality: N/A;   Family History  Problem Relation Age of Onset  . Cancer Mother     colon   . Colon cancer Mother 41  . Heart disease Father 60    heart attack   History  Substance Use Topics  . Smoking status: Never Smoker   . Smokeless tobacco: Never Used  . Alcohol Use: No   OB History   Grav Para Term Preterm Abortions TAB SAB Ect Mult Living                 Review of Systems  10 systems reviewed and found to be negative, except as noted in the HPI.  Allergies  Aspartame; Daliresp; Hydrocodone; Oxycodone; Phenergan; and Ace inhibitors  Home Medications   Prior to Admission medications   Medication Sig Start Date End Date Taking? Authorizing Provider  Aclidinium Bromide (TUDORZA PRESSAIR) 400 MCG/ACT AEPB Inhale 1 puff into the lungs 2 (two) times daily as needed (wheezing).    Yes Historical Provider, MD  ALPRAZolam Duanne Moron) 0.5 MG tablet Take 1 tablet (0.5 mg total) by mouth 3 (three) times daily as needed for sleep or anxiety. 09/20/13  Yes [name], MD  amLODipine (NORVASC) 10 MG tablet Take 10 mg by mouth every morning.    Yes Historical Provider, MD  Cholecalciferol (VITAMIN D3) 2000 UNITS TABS Take 6,000 Units by mouth daily.    Yes Historical Provider, MD  gabapentin (NEURONTIN) 300 MG capsule Take 1 capsule (300 mg total) by mouth 3 (three) times daily. 07/08/13 01/08/15 Yes [name], MD  losartan (COZAAR) 100 MG tablet Take 100 mg by mouth daily.   Yes Historical Provider, MD  metFORMIN (GLUCOPHAGE-XR) 500 MG 24 hr tablet Take 500 mg by mouth 2 (two)  times daily. 09/22/13 09/23/14 Yes [name], MD  metoCLOPramide (REGLAN) 10 MG tablet Take 0.5-1 tablets (5-10 mg total) by mouth 4 (four) times daily -  before meals and at bedtime. For nausea or reflux 09/20/13 09/21/14 Yes [name], MD  metoprolol tartrate (LOPRESSOR) 25 MG tablet Take 12.5 mg by mouth at bedtime.   Yes Historical Provider, MD  mometasone-formoterol (DULERA) 200-5 MCG/ACT AERO Inhale 2 puffs into the lungs 2 (two) times daily. 07/03/13  Yes Melissa R Smith, PA-C  omeprazole (PRILOSEC) 20 MG capsule Take 1 capsule (20 mg total) by mouth daily. 07/09/13 07/09/14 Yes Vicie Mutters, PA-C  ondansetron (ZOFRAN ODT) 8 MG disintegrating tablet Take 1 tablet (8 mg total) by  mouth every 8 (eight) hours as needed for nausea or vomiting. For nausea 09/20/13 09/21/14 Yes [name], MD  OVER THE COUNTER MEDICATION Take 1,000 mg by mouth every other day. Coral Calcium 1000mg    Yes Historical Provider, MD  OVER THE COUNTER MEDICATION Take 400 mg by mouth 3 (three) times daily. Magnesium Carbonate 400mg    Yes Historical Provider, MD  pantoprazole (PROTONIX) 40 MG tablet TAKE ONE TABLET BY MOUTH TWICE DAILY FOR STOMACH 08/28/13  Yes Melissa R Smith, PA-C  polyethylene glycol powder (MIRALAX) powder Take 8.5-34 g by mouth 2 (two) times daily. To correct constipation.  Adjust dose over 1-2 months.  Goal = ~1 bowel movement / day 06/26/13  Yes Adin Hector, MD  pravastatin (PRAVACHOL) 40 MG tablet Take 40 mg by mouth at bedtime.    Yes Historical Provider, MD  promethazine (PHENERGAN) 12.5 MG tablet Take 1 tablet (12.5 mg total) by mouth every 6 (six) hours as needed for nausea or vomiting. 06/24/13  Yes Adin Hector, MD  traMADol (ULTRAM) 50 MG tablet Take 1-2 tablets (50-100 mg total) by mouth every 6 (six) hours as needed for moderate pain or severe pain. 06/26/13  Yes Adin Hector, MD   BP 138/71  Pulse 75  Temp(Src) 97.8 F (36.6 C) (Oral)  Resp 16  SpO2 98% Physical Exam   Nursing note and vitals reviewed. Constitutional: She is oriented to person, place, and time. She appears well-developed and well-nourished. No distress.  HENT:  Head: Normocephalic and atraumatic.  Mouth/Throat: Oropharynx is clear and moist.  Dry mucous membranes  Eyes: Conjunctivae and EOM are normal. Pupils are equal, round, and reactive to light.  Neck: Normal range of motion.  Cardiovascular: Normal rate, regular rhythm and intact distal pulses.   Pulmonary/Chest: Effort normal and breath sounds normal. No stridor. No respiratory distress. She has no wheezes. She has no rales. She exhibits no tenderness.  Abdominal: Soft. Bowel sounds are normal. She exhibits no distension and no mass. There is tenderness. There is no rebound and no guarding.  Tenderness to palpation in the lower quadrants, right greater than left, no guarding or rebound.  Musculoskeletal: Normal range of motion.  Neurological: She is alert and oriented to person, place, and time.  Skin: Skin is warm.  Psychiatric: She has a normal mood and affect.    ED Course  Procedures (including critical care time) Labs Review Labs Reviewed  CBC WITH DIFFERENTIAL  COMPREHENSIVE METABOLIC PANEL  LIPASE, BLOOD    Imaging Review No results found.   EKG Interpretation None      MDM   Final diagnoses:  None    Filed Vitals:   09/27/13 1645 09/27/13 1745 09/27/13 1800 09/27/13 1857  BP: 127/72 137/64 154/65 163/68  Pulse: 70 70 71 66  Temp:      TempSrc:      Resp:      SpO2: 97% 99% 99% 98%    Medications  iohexol (OMNIPAQUE) 300 MG/ML solution 25 mL (25 mLs Oral Contrast Given 09/27/13 1840)  0.9 %  sodium chloride infusion ( Intravenous New Bag/Given 09/27/13 1707)  ondansetron (ZOFRAN) injection 4 mg (4 mg Intravenous Given 09/27/13 1708)    Aniah Pauli is a 78 y.o. female presenting with nausea and diarrhea onset this a.m. Patient states she has had multiple episodes of watery diarrhea. Appears  dehydrated. Patient had recent antibiotic use of Levaquin last week. Abdominal exam with tenderness to palpation, no guarding or rebound. Serial abdominal exams  remained consistent. Blood work reassuring.   Case signed out to PA fumes at shift change: Plan is to followup CT abdomen pelvis and obtain samples for stool testing.   Note: Portions of this report may have been transcribed using voice recognition software. Every effort was made to ensure accuracy; however, inadvertent computerized transcription errors may be present    Monico Blitz, PA-C 09/27/13 2022

## 2013-09-27 NOTE — ED Provider Notes (Signed)
Medical screening examination/treatment/procedure(s) were performed by non-physician practitioner and as supervising physician I was immediately available for consultation/collaboration.     Veryl Speak, MD 09/27/13 [phone]

## 2013-09-27 NOTE — ED Provider Notes (Signed)
2000 - Patient care assumed from [facility], PA-C at shift change. Patient presented to the ED today for nausea and diarrhea. Diarrhea has been nonbloody. She states the symptoms began after starting metformin. CT imaging pending. Plan discussed with [place], PA-C which includes discharge home with primary care followup if imaging negative. Will also attempt to collect stool culture.  2200 - CT imaging today negative for any acute abdominopelvic findings. Appendix not visualized, however, patient states she had her appendix taken out years ago. Patient has a soft abdomen on my examination without peritoneal signs. Abdominal examination stable compared to exam of prior provider. Have reviewed findings with the patient verbalizes understanding. She has been unable to provide a stool culture, so will obtain this as an outpatient if possible. Patient hemodynamically stable and appropriate for discharge today with instruction for PCP f/u on Monday. Return precautions provided and patient agreeable to plan with no unaddressed concerns.   Results for orders placed during the hospital encounter of 09/27/13  CBC WITH DIFFERENTIAL      Result Value Ref Range   WBC 7.8  4.0 - 10.5 K/uL   RBC 4.27  3.87 - 5.11 MIL/uL   Hemoglobin 12.3  12.0 - 15.0 g/dL   HCT 38.1  36.0 - 46.0 %   MCV 89.2  78.0 - 100.0 fL   MCH 28.8  26.0 - 34.0 pg   MCHC 32.3  30.0 - 36.0 g/dL   RDW 14.1  11.5 - 15.5 %   Platelets 317  150 - 400 K/uL   Neutrophils Relative % 70  43 - 77 %   Neutro Abs 5.4  1.7 - 7.7 K/uL   Lymphocytes Relative 21  12 - 46 %   Lymphs Abs 1.6  0.7 - 4.0 K/uL   Monocytes Relative 7  3 - 12 %   Monocytes Absolute 0.5  0.1 - 1.0 K/uL   Eosinophils Relative 2  0 - 5 %   Eosinophils Absolute 0.2  0.0 - 0.7 K/uL   Basophils Relative 0  0 - 1 %   Basophils Absolute 0.0  0.0 - 0.1 K/uL  COMPREHENSIVE METABOLIC PANEL      Result Value Ref Range   Sodium 140  137 - 147 mEq/L   Potassium 3.8  3.7 - 5.3  mEq/L   Chloride 101  96 - 112 mEq/L   CO2 26  19 - 32 mEq/L   Glucose, Bld 88  70 - 99 mg/dL   BUN 15  6 - 23 mg/dL   Creatinine, Ser 0.72  0.50 - 1.10 mg/dL   Calcium 9.6  8.4 - 10.5 mg/dL   Total Protein 7.1  6.0 - 8.3 g/dL   Albumin 3.9  3.5 - 5.2 g/dL   AST 14  0 - 37 U/L   ALT 17  0 - 35 U/L   Alkaline Phosphatase 83  39 - 117 U/L   Total Bilirubin 0.4  0.3 - 1.2 mg/dL   GFR calc non Af Amer 82 (*) >90 mL/min   GFR calc Af Amer >90  >90 mL/min  LIPASE, BLOOD      Result Value Ref Range   Lipase 11  11 - 59 U/L  URINALYSIS, ROUTINE W REFLEX MICROSCOPIC      Result Value Ref Range   Color, Urine YELLOW  YELLOW   APPearance CLOUDY (*) CLEAR   Specific Gravity, Urine 1.024  1.005 - 1.030   pH 6.5  5.0 - 8.0  Glucose, UA NEGATIVE  NEGATIVE mg/dL   Hgb urine dipstick NEGATIVE  NEGATIVE   Bilirubin Urine NEGATIVE  NEGATIVE   Ketones, ur 15 (*) NEGATIVE mg/dL   Protein, ur NEGATIVE  NEGATIVE mg/dL   Urobilinogen, UA 1.0  0.0 - 1.0 mg/dL   Nitrite NEGATIVE  NEGATIVE   Leukocytes, UA NEGATIVE  NEGATIVE   Ct Abdomen Pelvis W Contrast  09/27/2013   CLINICAL DATA:  Right lower abdominal pain.  Diarrhea.  Vomiting.  EXAM: CT ABDOMEN AND PELVIS WITH CONTRAST  TECHNIQUE: Multidetector CT imaging of the abdomen and pelvis was performed using the standard protocol following bolus administration of intravenous contrast.  CONTRAST:  41mL OMNIPAQUE IOHEXOL 300 MG/ML  SOLN  COMPARISON:  05/13/2013  FINDINGS: Lung bases are essentially clear.  The heart is normal in size.  There are stable changes from previous pelvic surgery, what appears to be hiatal hernia surgery.  Small cyst in the central liver. Liver is otherwise unremarkable. Normal spleen.  Gallbladder is surgically absent. Mild chronic dilation of the common bile duct with normal distal tapering. Normal pancreas. No adrenal masses. Cm low-density lesion along the atrium margin of the left kidney at the midpole the likely a cyst. Kidneys  otherwise unremarkable. Normal ureters. Normal bladder.  Uterus is surgically absent.  No pelvic masses.  No pathologically enlarged lymph nodes. No abnormal fluid collections.  Colon is unremarkable. Normal small bowel. Normal appendix not visualized. No evidence of appendicitis.  There are advanced degenerative changes throughout the spine. There are chronic compression fractures of L1, L3 and L4. L3 and L4 fractures treated with previous kyphoplasty. No osteoblastic or osteolytic lesions.  IMPRESSION: 1. No acute findings. Appendix not visualized but no evidence of appendicitis. 2. Multiple chronic findings including changes from hiatal hernia surgery and a cholecystectomy and treatment of vertebral fractures in the lumbar spine.   Electronically Signed   By: Lajean Manes M.D.   On: 09/27/2013 21:01      Antonietta Breach, PA-C 09/27/13 2220

## 2013-09-27 NOTE — Discharge Instructions (Signed)
Recommend you continue taking Zofran or phenergan as needed for nausea. Follow the diet below for diarrhea. Follow up with your primary doctor on Monday to discuss your symptoms. Return if symptoms worsen.  Diarrhea Diarrhea is watery poop (stool). It can make you feel weak, tired, thirsty, or give you a dry mouth (signs of dehydration). Watery poop is a sign of another problem, most often an infection. It often lasts 2 3 days. It can last longer if it is a sign of something serious. Take care of yourself as told by your doctor. HOME CARE   Drink 1 cup (8 ounces) of fluid each time you have watery poop.  Do not drink the following fluids:  Those that contain simple sugars (fructose, glucose, galactose, lactose, sucrose, maltose).  Sports drinks.  Fruit juices.  Whole milk products.  Sodas.  Drinks with caffeine (coffee, tea, soda) or alcohol.  Oral rehydration solution may be used if the doctor says it is okay. You may make your own solution. Follow this recipe:    teaspoon table salt.   teaspoon baking soda.   teaspoon salt substitute containing potassium chloride.  1 tablespoons sugar.  1 liter (34 ounces) of water.  Avoid the following foods:  High fiber foods, such as raw fruits and vegetables.  Nuts, seeds, and whole grain breads and cereals.   Those that are sweetened with sugar alcohols (xylitol, sorbitol, mannitol).  Try eating the following foods:  Starchy foods, such as rice, toast, pasta, low-sugar cereal, oatmeal, baked potatoes, crackers, and bagels.  Bananas.  Applesauce.  Eat probiotic-rich foods, such as yogurt and milk products that are fermented.  Wash your hands well after each time you have watery poop.  Only take medicine as told by your doctor.  Take a warm bath to help lessen burning or pain from having watery poop. GET HELP RIGHT AWAY IF:   You cannot drink fluids without throwing up (vomiting).  You keep throwing up.  You have  blood in your poop, or your poop looks black and tarry.  You do not pee (urinate) in 6 8 hours, or there is only a small amount of very dark pee.  You have belly (abdominal) pain that gets worse or stays in the same spot (localizes).  You are weak, dizzy, confused, or lightheaded.  You have a very bad headache.  Your watery poop gets worse or does not get better.  You have a fever or lasting symptoms for more than 2 3 days.  You have a fever and your symptoms suddenly get worse. MAKE SURE YOU:   Understand these instructions.  Will watch your condition.  Will get help right away if you are not doing well or get worse. Document Released: 10/12/2007 Document Revised: 01/18/2012 Document Reviewed: 01/01/2012 [facility] Patient Information 2014 [place], [place]. Diet for Diarrhea, Adult Frequent, runny stools (diarrhea) may be caused or worsened by food or drink. Diarrhea may be relieved by changing your diet. Since diarrhea can last up to 7 days, it is easy for you to lose too much fluid from the body and become dehydrated. Fluids that are lost need to be replaced. Along with a modified diet, make sure you drink enough fluids to keep your urine clear or pale yellow. DIET INSTRUCTIONS  Ensure adequate fluid intake (hydration): have 1 cup (8 oz) of fluid for each diarrhea episode. Avoid fluids that contain simple sugars or sports drinks, fruit juices, whole milk products, and sodas. Your urine should be clear or  pale yellow if you are drinking enough fluids. Hydrate with an oral rehydration solution that you can purchase at pharmacies, retail stores, and online. You can prepare an oral rehydration solution at home by mixing the following ingredients together:    tsp table salt.   tsp baking soda.   tsp salt substitute containing potassium chloride.  1  tablespoons sugar.  1 L (34 oz) of water.  Certain foods and beverages may increase the speed at which food moves through the  gastrointestinal (GI) tract. These foods and beverages should be avoided and include:  Caffeinated and alcoholic beverages.  High-fiber foods, such as raw fruits and vegetables, nuts, seeds, and whole grain breads and cereals.  Foods and beverages sweetened with sugar alcohols, such as xylitol, sorbitol, and mannitol.  Some foods may be well tolerated and may help thicken stool including:  Starchy foods, such as rice, toast, pasta, low-sugar cereal, oatmeal, grits, baked potatoes, crackers, and bagels.   Bananas.   Applesauce.  Add probiotic-rich foods to help increase healthy bacteria in the GI tract, such as yogurt and fermented milk products. RECOMMENDED FOODS AND BEVERAGES Starches Choose foods with less than 2 g of fiber per serving.  Recommended:  White, [place], and pita breads, plain rolls, buns, bagels. Plain muffins, matzo. Soda, saltine, or graham crackers. Pretzels, melba toast, zwieback. Cooked cereals made with water: cornmeal, farina, cream cereals. Dry cereals: refined corn, wheat, rice. Potatoes prepared any way without skins, refined macaroni, spaghetti, noodles, refined rice.  Avoid:  Bread, rolls, or crackers made with whole wheat, multi-grains, rye, bran seeds, nuts, or coconut. Corn tortillas or taco shells. Cereals containing whole grains, multi-grains, bran, coconut, nuts, raisins. Cooked or dry oatmeal. Coarse wheat cereals, granola. Cereals advertised as "high-fiber." Potato skins. Whole grain pasta, wild or brown rice. Popcorn. Sweet potatoes, yams. Sweet rolls, doughnuts, waffles, pancakes, sweet breads. Vegetables  Recommended: Strained tomato and vegetable juices. Most well-cooked and canned vegetables without seeds. Fresh: Tender lettuce, cucumber without the skin, cabbage, spinach, bean sprouts.  Avoid: Fresh, cooked, or canned: Artichokes, baked beans, beet greens, broccoli, Brussels sprouts, corn, kale, legumes, peas, sweet potatoes. Cooked: Green or red  cabbage, spinach. Avoid large servings of any vegetables because vegetables shrink when cooked, and they contain more fiber per serving than fresh vegetables. Fruit  Recommended: Cooked or canned: Apricots, applesauce, cantaloupe, cherries, fruit cocktail, grapefruit, grapes, kiwi, mandarin oranges, peaches, pears, plums, watermelon. Fresh: Apples without skin, ripe banana, grapes, cantaloupe, cherries, grapefruit, peaches, oranges, plums. Keep servings limited to  cup or 1 piece.  Avoid: Fresh: Apples with skin, apricots, mangoes, pears, raspberries, strawberries. Prune juice, stewed or dried prunes. Dried fruits, raisins, dates. Large servings of all fresh fruits. Protein  Recommended: Ground or well-cooked tender beef, ham, veal, lamb, pork, or poultry. Eggs. Fish, oysters, shrimp, lobster, other seafoods. Liver, organ meats.  Avoid: Tough, fibrous meats with gristle. Peanut butter, smooth or chunky. Cheese, nuts, seeds, legumes, dried peas, beans, lentils. Dairy  Recommended: Yogurt, lactose-free milk, kefir, drinkable yogurt, buttermilk, soy milk, or plain hard cheese.  Avoid: Milk, chocolate milk, beverages made with milk, such as milkshakes. Soups  Recommended: Bouillon, broth, or soups made from allowed foods. Any strained soup.  Avoid: Soups made from vegetables that are not allowed, cream or milk-based soups. Desserts and Sweets  Recommended: Sugar-free gelatin, sugar-free frozen ice pops made without sugar alcohol.  Avoid: Plain cakes and cookies, pie made with fruit, pudding, custard, cream pie. Gelatin, fruit, ice, sherbet, frozen ice  pops. Ice cream, ice milk without nuts. Plain hard candy, honey, jelly, molasses, syrup, sugar, chocolate syrup, gumdrops, marshmallows. Fats and Oils  Recommended: Limit fats to less than 8 tsp per day.  Avoid: Seeds, nuts, olives, avocados. Margarine, butter, cream, mayonnaise, salad oils, plain salad dressings. Plain gravy, crisp bacon  without rind. Beverages  Recommended: Water, decaffeinated teas, oral rehydration solutions, sugar-free beverages not sweetened with sugar alcohols.  Avoid: Fruit juices, caffeinated beverages (coffee, tea, soda), alcohol, sports drinks, or lemon-lime soda. Condiments  Recommended: Ketchup, mustard, horseradish, vinegar, cocoa powder. Spices in moderation: allspice, basil, bay leaves, celery powder or leaves, cinnamon, cumin powder, curry powder, ginger, mace, marjoram, onion or garlic powder, oregano, paprika, parsley flakes, ground pepper, rosemary, sage, savory, tarragon, thyme, turmeric.  Avoid: Coconut, honey. Document Released: 07/16/2003 Document Revised: 01/18/2012 Document Reviewed: 09/09/2011 [facility] Patient Information 2014 [place].

## 2013-09-27 NOTE — ED Notes (Addendum)
Pt states she was recently put on Metformin several days ago and has been having nausea ever since she started it. Has been taking Reglan and Zofran. Last dose Reglan was 0800 today, last dose Zofran 1400 today.

## 2013-09-27 NOTE — ED Notes (Signed)
Pt finished contrast, ct called.

## 2013-09-27 NOTE — ED Notes (Signed)
Per pt sts N,V,D since this am. sts she has been taking Reglan without relief.

## 2013-09-27 NOTE — ED Notes (Signed)
Pt informed stool sample is needed. Pt unable to provide sample at this time. [place] notified.

## 2013-09-28 LAB — CLOSTRIDIUM DIFFICILE BY PCR: Toxigenic C. Difficile by PCR: NEGATIVE

## 2013-09-28 NOTE — ED Provider Notes (Signed)
Medical screening examination/treatment/procedure(s) were performed by non-physician practitioner and as supervising physician I was immediately available for consultation/collaboration.   EKG Interpretation None       Virgel Manifold, MD 09/28/13 1550

## 2013-10-01 ENCOUNTER — Encounter: Payer: Self-pay | Admitting: Physician Assistant

## 2013-10-01 ENCOUNTER — Ambulatory Visit (INDEPENDENT_AMBULATORY_CARE_PROVIDER_SITE_OTHER): Payer: Medicare HMO | Admitting: Physician Assistant

## 2013-10-01 VITALS — BP 102/60 | HR 68 | Temp 98.1°F | Resp 16 | Wt 125.0 lb

## 2013-10-01 DIAGNOSIS — R112 Nausea with vomiting, unspecified: Secondary | ICD-10-CM

## 2013-10-01 MED ORDER — TRAMADOL HCL 50 MG PO TABS: 50.0000 mg | ORAL_TABLET | Freq: Four times a day (QID) | ORAL | Status: DC | PRN

## 2013-10-01 NOTE — Progress Notes (Signed)
   Subjective:    Patient ID: [name] [name], female    DOB: 08-28-1935, 78 y.o.   MRN: [id number]  HPI 78 y.o. with history of recurrent paraesophageal HH repair Dec 2014, gastritis, presents s/p ER follow up for nausea/diarrhea after starting Metformin. She is off the metformin and she has been checking her sugars and she is doing better.   Lab Results  Component Value Date   CREATININE 0.72 09/27/2013   BUN 15 09/27/2013   NA 140 09/27/2013   K 3.8 09/27/2013   CL 101 09/27/2013   CO2 26 09/27/2013     Review of Systems  Constitutional: Positive for fatigue. Negative for fever, chills, diaphoresis, activity change, appetite change and unexpected weight change.  HENT: Negative.   Respiratory: Negative.   Cardiovascular: Negative.   Gastrointestinal: Positive for nausea, diarrhea and abdominal distention. Negative for vomiting, abdominal pain, constipation, blood in stool and rectal pain.  Genitourinary: Negative.   Musculoskeletal: Negative.   Neurological: Negative.        Objective:   Physical Exam  Nursing note and vitals reviewed. Constitutional: She is oriented to person, place, and time. She appears well-developed. No distress.  HENT:  Head: Normocephalic and atraumatic.  Right Ear: External ear normal.  Left Ear: External ear normal.  Nose: Nose normal.  Mouth/Throat: Oropharynx is clear and moist. No oropharyngeal exudate.  Eyes: Conjunctivae and EOM are normal.  Neck: Normal range of motion. Neck supple. No JVD present. No thyromegaly present.  Cardiovascular: Normal rate, regular rhythm, normal heart sounds and intact distal pulses.   Pulmonary/Chest: Effort normal and breath sounds normal.  Abdominal: Soft. Bowel sounds are normal. She exhibits no distension and no mass. There is tenderness. There is no rebound and no guarding.  Minimal epigastric  Musculoskeletal: Normal range of motion. She exhibits no edema and no tenderness.  Lymphadenopathy:    She has no  cervical adenopathy.  Neurological: She is alert and oriented to person, place, and time. No cranial nerve deficit.  Skin: Skin is warm and dry. No rash noted. No erythema. No pallor.  Psychiatric: She has a normal mood and affect. Her behavior is normal. Judgment and thought content normal.      Assessment & Plan:  PreDM- stop metformin Nausea- continue phenergan and follow up with Dr. Earlean Shawl.  Leg pain- potassium was okay, will continue meds- refill tramadol 50 #120

## 2013-10-01 NOTE — Patient Instructions (Signed)

## 2013-10-18 ENCOUNTER — Other Ambulatory Visit: Payer: Self-pay | Admitting: Internal Medicine

## 2013-10-22 ENCOUNTER — Encounter (HOSPITAL_COMMUNITY): Payer: Self-pay | Admitting: Emergency Medicine

## 2013-10-22 ENCOUNTER — Emergency Department (HOSPITAL_COMMUNITY): Payer: Medicare HMO

## 2013-10-22 ENCOUNTER — Telehealth: Payer: Self-pay | Admitting: Gastroenterology

## 2013-10-22 ENCOUNTER — Emergency Department (HOSPITAL_COMMUNITY)
Admission: EM | Admit: 2013-10-22 | Discharge: 2013-10-22 | Disposition: A | Discharge status: Home / Self Care | Payer: Medicare HMO | Attending: Emergency Medicine | Admitting: Emergency Medicine

## 2013-10-22 DIAGNOSIS — Z90710 Acquired absence of both cervix and uterus: Secondary | ICD-10-CM | POA: Insufficient documentation

## 2013-10-22 DIAGNOSIS — R071 Chest pain on breathing: Secondary | ICD-10-CM | POA: Insufficient documentation

## 2013-10-22 DIAGNOSIS — K219 Gastro-esophageal reflux disease without esophagitis: Secondary | ICD-10-CM | POA: Insufficient documentation

## 2013-10-22 DIAGNOSIS — R51 Headache: Secondary | ICD-10-CM | POA: Insufficient documentation

## 2013-10-22 DIAGNOSIS — Z792 Long term (current) use of antibiotics: Secondary | ICD-10-CM | POA: Insufficient documentation

## 2013-10-22 DIAGNOSIS — Z9889 Other specified postprocedural states: Secondary | ICD-10-CM | POA: Insufficient documentation

## 2013-10-22 DIAGNOSIS — Z9089 Acquired absence of other organs: Secondary | ICD-10-CM | POA: Insufficient documentation

## 2013-10-22 DIAGNOSIS — R1013 Epigastric pain: Secondary | ICD-10-CM | POA: Insufficient documentation

## 2013-10-22 DIAGNOSIS — R1011 Right upper quadrant pain: Secondary | ICD-10-CM | POA: Insufficient documentation

## 2013-10-22 DIAGNOSIS — J45909 Unspecified asthma, uncomplicated: Secondary | ICD-10-CM | POA: Insufficient documentation

## 2013-10-22 DIAGNOSIS — Z79899 Other long term (current) drug therapy: Secondary | ICD-10-CM | POA: Insufficient documentation

## 2013-10-22 DIAGNOSIS — R109 Unspecified abdominal pain: Secondary | ICD-10-CM

## 2013-10-22 DIAGNOSIS — E785 Hyperlipidemia, unspecified: Secondary | ICD-10-CM | POA: Insufficient documentation

## 2013-10-22 DIAGNOSIS — Z8673 Personal history of transient ischemic attack (TIA), and cerebral infarction without residual deficits: Secondary | ICD-10-CM | POA: Insufficient documentation

## 2013-10-22 DIAGNOSIS — I10 Essential (primary) hypertension: Secondary | ICD-10-CM | POA: Insufficient documentation

## 2013-10-22 DIAGNOSIS — Z862 Personal history of diseases of the blood and blood-forming organs and certain disorders involving the immune mechanism: Secondary | ICD-10-CM | POA: Insufficient documentation

## 2013-10-22 DIAGNOSIS — F411 Generalized anxiety disorder: Secondary | ICD-10-CM | POA: Insufficient documentation

## 2013-10-22 LAB — BASIC METABOLIC PANEL
BUN: 10 mg/dL (ref 6–23)
BUN: 9 mg/dL (ref 6–23)
CALCIUM: 9.8 mg/dL (ref 8.4–10.5)
CALCIUM: 9.4 mg/dL (ref 8.4–10.5)
CO2: 21 mEq/L (ref 19–32)
CO2: 22 meq/L (ref 19–32)
CREATININE: 0.7 mg/dL (ref 0.50–1.10)
Chloride: 100 mEq/L (ref 96–112)
Chloride: 107 mEq/L (ref 96–112)
Creatinine, Ser: 0.75 mg/dL (ref 0.50–1.10)
GFR calc Af Amer: >90 mL/min (ref >90)
GFR calc Af Amer: >90 mL/min (ref >90)
GFR calc non Af Amer: 81 mL/min — ABNORMAL LOW (ref >90)
GFR, EST NON AFRICAN AMERICAN: 83 mL/min — AB (ref >90)
Glucose, Bld: 122 mg/dL — ABNORMAL HIGH (ref 70–99)
Glucose, Bld: 109 mg/dL — ABNORMAL HIGH (ref 70–99)
POTASSIUM: 5.7 meq/L — AB (ref 3.7–5.3)
Potassium: 3.8 mEq/L (ref 3.7–5.3)
SODIUM: 143 meq/L (ref 137–147)
Sodium: 139 mEq/L (ref 137–147)

## 2013-10-22 LAB — CBC
HEMATOCRIT: 41 % (ref 36.0–46.0)
Hemoglobin: 13.5 g/dL (ref 12.0–15.0)
MCH: 29 pg (ref 26.0–34.0)
MCHC: 32.9 g/dL (ref 30.0–36.0)
MCV: 88.2 fL (ref 78.0–100.0)
Platelets: 220 10*3/uL (ref 150–400)
RBC: 4.65 MIL/uL (ref 3.87–5.11)
RDW: 15.1 % (ref 11.5–15.5)
WBC: 4.8 10*3/uL (ref 4.0–10.5)

## 2013-10-22 LAB — I-STAT TROPONIN, ED: Troponin i, poc: 0.01 ng/mL (ref 0.00–0.08)

## 2013-10-22 MED ORDER — ONDANSETRON HCL 4 MG/2ML IJ SOLN
4.0000 mg | Freq: Once | INTRAMUSCULAR | Status: AC
Start: 2013-10-22 — End: 2013-10-22
  Administered 2013-10-22: 4 mg via INTRAVENOUS
  Filled 2013-10-22: qty 2

## 2013-10-22 MED ORDER — BACLOFEN 10 MG PO TABS
10.0000 mg | ORAL_TABLET | Freq: Three times a day (TID) | ORAL | Status: DC
Start: 2013-10-22 — End: 2013-10-28

## 2013-10-22 MED ORDER — HYDROMORPHONE HCL PF 1 MG/ML IJ SOLN
1.0000 mg | Freq: Once | INTRAMUSCULAR | Status: AC
  Administered 2013-10-22: 1 mg via INTRAVENOUS
  Filled 2013-10-22: qty 1

## 2013-10-22 MED ORDER — FENTANYL CITRATE 0.05 MG/ML IJ SOLN
25.0000 ug | Freq: Once | INTRAMUSCULAR | Status: AC
  Administered 2013-10-22: 25 ug via INTRAVENOUS
  Filled 2013-10-22: qty 2

## 2013-10-22 MED ORDER — SODIUM CHLORIDE 0.9 % IV BOLUS (SEPSIS)
1000.0000 mL | Freq: Once | INTRAVENOUS | Status: AC
  Administered 2013-10-22: 1000 mL via INTRAVENOUS

## 2013-10-22 NOTE — ED Notes (Signed)
[name] [name], NT given report

## 2013-10-22 NOTE — ED Notes (Addendum)
Pt. woke up this morning with nausea, headache and pain under right breast . Pt. states history of CAD.

## 2013-10-22 NOTE — Discharge Instructions (Signed)
As discussed, your evaluation today has been largely reassuring.  But, it is important that you monitor your condition carefully, and do not hesitate to return to the ED if you develop new, or concerning changes in your condition.  You have been started on a new medication in an attempt to decrease your discomfort.  Please be sure to discuss this with your gastroenterologist promptly.

## 2013-10-22 NOTE — ED Notes (Signed)
Patient transported to X-ray 

## 2013-10-22 NOTE — Telephone Encounter (Signed)
Pt states she was seen in the ER. States she was having problems with nausea and was told it was related to spasms. Pt has had fundiplication surgery. Pt requesting to be seen sooner than 1st available. Pt scheduled to see Alonza Bogus PA 10/28/13@9am . Pt aware of appt.

## 2013-10-22 NOTE — ED Notes (Signed)
Pt still in x-ray

## 2013-10-22 NOTE — ED Notes (Signed)
Patient currently in x-ray. Sent from triage.

## 2013-10-22 NOTE — ED Notes (Signed)
Pt back to room from being out of the department; pt placed back on monitor, continuous pulse oximetry and blood pressure cuff; family at bedside

## 2013-10-22 NOTE — ED Provider Notes (Signed)
CSN: [id number]     Arrival date & time 10/22/13  0454 History   First MD Initiated Contact with Patient 10/22/13 [phone]     Chief Complaint  Patient presents with  . Nausea  . Headache  . Breast Pain      HPI  Patient presents with multiple complaints. She was in her usual state of health until 3 days ago.  About that time she developed nausea.  Subsequent she developed discomfort in her right inframammary area, epigastric area, as well as worsening of her nausea. There has been no vomiting, diarrhea. Patient has had more frequent stool than usual, though no bloody stool.  No other abdominal pain, chest pain. Patient answers in the affirmative for additional review of questions including dyspnea, weakness, chills, general discomfort. The patient has a history of hiatal hernia status post repair earlier this year, as well as recent kyphoplasty of the lumbar spine. Patient has been seen here in the emergency department within the past month for similar concerns that those she has today. She specifically is worried about the status of her hiatal hernia repair.  Past Medical History  Diagnosis Date  . CAD (coronary artery disease)     LHC 2003 with 40% pLAD, 30% mLAD, 100% D1 (moderate vessel), 100%  D2 (small vessel), 95% mid small OM2.  Pt had PTCA to D1;  D2 and OM2 small vessels and tx medically;  Last Myoview (2012): anterior infarct seen with scar, no ischemia. EF normal. Pt managed medically;  Lexiscan Myoview (12/14):  Low risk; ant scar with very mild peri-infarct ischemia; EF 55% with ant AK   . Recurrent aspiration bronchitis/pneumonia     PFTs 12/09 FVC 97%, FEV1 101%, ratio 77%, TLC 109%. No significant obstruction or restriction.   . Hypertension   . Hyperlipidemia   . Esophageal stricture   . Diverticulosis of colon   . Gastroesophageal reflux disease with hiatal hernia   . Iron deficiency anemia   . Gastritis   . GERD (gastroesophageal reflux disease)   . Hiatal hernia    . Asthma   . Blood transfusion   . Cough   . Wheezing   . Weakness   . Trouble swallowing   . Abdominal pain   . Hiatal hernia   . Shortness of breath   . Anxiety   . Complication of anesthesia   . PONV (postoperative nausea and vomiting)   . TIA (transient ischemic attack)   . BRONCHITIS, ACUTE 07/25/2007         . DIVERTICULOSIS, COLON 08/28/2008    Qualifier: Diagnosis of  By: [name], [name], [name]    . IRON DEFICIENCY ANEMIA, HX OF 07/24/2007    Qualifier: Diagnosis of  By: Tilden Dome     Past Surgical History  Procedure Laterality Date  . Back surgery    . Cholecystectomy    . Bladder repair    . Hemorrhoid surgery    . Cholecystectomy      Patient unsure of date.  [name] Appendectomy      patient unsure of date  . Total abdominal hysterectomy  1975    partial  . Coronary angioplasty  11/16/2001  . Eye surgery  11/2000    bilateral cataracts with lens implant  . Hiatal hernia repair  06/03/2011    Procedure: LAPAROSCOPIC REPAIR OF HIATAL HERNIA;  Surgeon: Adin Hector, MD;  Location: [facility];  Service: General;  Laterality: N/A;  . Laparoscopic nissen fundoplication  5/32/9924  Procedure: LAPAROSCOPIC NISSEN FUNDOPLICATION;  Surgeon: Adin Hector, MD;  Location: [facility];  Service: General;  Laterality: N/A;  . Lumbar laminectomy/decompression microdiscectomy Right 06/26/2012    Procedure: LUMBAR LAMINECTOMY/DECOMPRESSION MICRODISCECTOMY;  Surgeon: Jessy Oto, MD;  Location: [facility];  Service: Orthopedics;  Laterality: Right;  RIGHT L4-5 MICRODISCECTOMY  . Esophagogastroduodenoscopy N/A 03/15/2013    Procedure: ESOPHAGOGASTRODUODENOSCOPY (EGD);  Surgeon: Jerene Bears, MD;  Location: [facility];  Service: Endoscopy;  Laterality: N/A;  . Esophageal manometry N/A 03/25/2013    Procedure: ESOPHAGEAL MANOMETRY (EM);  Surgeon: Inda Castle, MD;  Location: [facility];  Service: Endoscopy;  Laterality: N/A;  . Hiatal hernia repair N/A 04/24/2013    Procedure:  LAPAROSCOPIC  REPAIR RECURRENT PARASOPHAGEAL HIATAL HERNIA WITH FUNDOPLICATION;  Surgeon: Adin Hector, MD;  Location: [facility];  Service: General;  Laterality: N/A;  . Insertion of mesh N/A 04/24/2013    Procedure: INSERTION OF MESH;  Surgeon: Adin Hector, MD;  Location: [facility];  Service: General;  Laterality: N/A;  . Laparoscopic lysis of adhesions N/A 04/24/2013    Procedure: LAPAROSCOPIC LYSIS OF ADHESIONS;  Surgeon: Adin Hector, MD;  Location: [facility];  Service: General;  Laterality: N/A;   Family History  Problem Relation Age of Onset  . Cancer Mother     colon   . Colon cancer Mother 44  . Heart disease Father 16    heart attack   History  Substance Use Topics  . Smoking status: Never Smoker   . Smokeless tobacco: Never Used  . Alcohol Use: No   OB History   Grav Para Term Preterm Abortions TAB SAB Ect Mult Living                 Review of Systems  Constitutional:       Per HPI, otherwise negative  HENT:       Per HPI, otherwise negative  Respiratory:       Per HPI, otherwise negative  Cardiovascular:       Per HPI, otherwise negative  Gastrointestinal: Positive for nausea. Negative for vomiting.  Endocrine:       Negative aside from HPI  Genitourinary:       Neg aside from HPI   Musculoskeletal:       Per HPI, otherwise negative  Skin: Negative.   Neurological: Positive for weakness. Negative for syncope.      Allergies  Aspartame; Daliresp; Hydrocodone; Oxycodone; Phenergan; Tramadol; and Ace inhibitors  Home Medications   Prior to Admission medications   Medication Sig Start Date End Date Taking? Authorizing Provider  Aclidinium Bromide (TUDORZA PRESSAIR) 400 MCG/ACT AEPB Inhale 1 puff into the lungs 2 (two) times daily as needed (wheezing).    Yes Historical Provider, MD  ALPRAZolam Duanne Moron) 0.5 MG tablet Take 1 tablet (0.5 mg total) by mouth 3 (three) times daily as needed for sleep or anxiety. 09/20/13  Yes [name], MD  amLODipine  (NORVASC) 10 MG tablet Take 10 mg by mouth every morning.    Yes Historical Provider, MD  azithromycin (ZITHROMAX) 250 MG tablet Take 250-500 mg by mouth daily. Take 500mg  on day one and 250mg  for the next 4 days   Yes Historical Provider, MD  Cholecalciferol (VITAMIN D3) 2000 UNITS TABS Take 6,000 Units by mouth daily.    Yes Historical Provider, MD  gabapentin (NEURONTIN) 300 MG capsule Take 1 capsule (300 mg total) by mouth 3 (three) times daily. 07/08/13 01/08/15 Yes [name], MD  losartan (COZAAR) 100 MG tablet Take 100 mg by mouth daily.   Yes Historical Provider, MD  metoCLOPramide (REGLAN) 10 MG tablet Take 0.5-1 tablets (5-10 mg total) by mouth 4 (four) times daily -  before meals and at bedtime. For nausea or reflux 09/20/13 09/21/14 Yes [name], MD  metoprolol tartrate (LOPRESSOR) 25 MG tablet Take 12.5 mg by mouth at bedtime.   Yes Historical Provider, MD  mometasone-formoterol (DULERA) 200-5 MCG/ACT AERO Inhale 2 puffs into the lungs 2 (two) times daily. 07/03/13  Yes Melissa R Smith, PA-C  ondansetron (ZOFRAN ODT) 8 MG disintegrating tablet Take 1 tablet (8 mg total) by mouth every 8 (eight) hours as needed for nausea or vomiting. For nausea 09/20/13 09/21/14 Yes [name], MD  OVER THE COUNTER MEDICATION Take 1,000 mg by mouth every other day. Coral Calcium 1000mg    Yes Historical Provider, MD  OVER THE COUNTER MEDICATION Take 400 mg by mouth 3 (three) times daily. Magnesium Carbonate 400mg    Yes Historical Provider, MD  pantoprazole (PROTONIX) 40 MG tablet Take 40 mg by mouth 2 (two) times daily.   Yes Historical Provider, MD  polyethylene glycol powder (MIRALAX) powder Take 8.5-34 g by mouth 2 (two) times daily. To correct constipation.  Adjust dose over 1-2 months.  Goal = ~1 bowel movement / day 06/26/13  Yes Adin Hector, MD  pravastatin (PRAVACHOL) 40 MG tablet Take 40 mg by mouth daily.   Yes Historical Provider, MD  promethazine (PHENERGAN) 12.5 MG tablet Take 1  tablet (12.5 mg total) by mouth every 6 (six) hours as needed for nausea or vomiting. 06/24/13  Yes Adin Hector, MD   BP 140/69  Pulse 73  Temp(Src) 98 F (36.7 C) (Oral)  Resp 20  SpO2 97% Physical Exam  Nursing note and vitals reviewed. Constitutional: She is oriented to person, place, and time. She appears well-developed and well-nourished. No distress.  HENT:  Head: Normocephalic and atraumatic.  Eyes: Conjunctivae and EOM are normal.  Cardiovascular: Normal rate and regular rhythm.   Pulmonary/Chest: Effort normal and breath sounds normal. No stridor. No respiratory distress.  Abdominal: She exhibits no distension. There is tenderness in the right upper quadrant and epigastric area.  Musculoskeletal: She exhibits no edema.  Neurological: She is alert and oriented to person, place, and time. No cranial nerve deficit.  Skin: Skin is warm and dry.  Psychiatric: She has a normal mood and affect.    ED Course  Procedures (including critical care time) Labs Review Labs Reviewed  BASIC METABOLIC PANEL - Abnormal; Notable for the following:    Potassium 5.7 (*)    Glucose, Bld 122 (*)    GFR calc non Af Amer 83 (*)    All other components within normal limits  CBC  I-STAT TROPOININ, ED    Imaging Review Dg Chest 2 View  10/22/2013   CLINICAL DATA:  Nausea, headache, right chest pain.  EXAM: CHEST  2 VIEW  COMPARISON:  Chest radiograph May 13, 2013  FINDINGS: The heart size and mediastinal contours are within normal limits. Mildly calcified aortic knob. Mild chronic interstitial changes with similar scarring in left lung base. Both lungs are clear. The visualized skeletal structures are nonsuspicious. Osteopenia. Surgical clips in the right upper quadrant most consistent with cholecystectomy.  IMPRESSION: No acute cardiopulmonary process.   Electronically Signed   By: Elon Alas   On: 10/22/2013 05:38     EKG Interpretation   Date/Time:  Tuesday October 22 2013  05:16:48 EDT Ventricular Rate:  79 PR Interval:  134 QRS Duration: 82 QT Interval:  388 QTC Calculation: 444 R Axis:   -10 Text Interpretation:  Normal sinus rhythm Low voltage QRS Septal infarct ,  age undetermined Possible Lateral infarct , age undetermined Abnormal ECG  Sinus rhythm Baseline wander Left axis deviation T wave abnormality  Abnormal ekg Confirmed by Carmin Muskrat  MD [phone]) on 10/22/2013 7:46:38  AM       Review of the patient's chart demonstrates multiple CT scans in the past year, ultrasound, x-ray.  11:22 AM Patient is awake and alert.  I had a lengthy conversation with her and her husband about all findings, specifically the decreased passage of esophageal contents into the stomach on x-ray. Patient's husband states that this abdominal discomfort has been present largely since her fundoplication. Patient has a gastroenterologist and she can followup. Given the patient's request for assistance with new pain management, she will be started on new medication, short-term, to be discussed with her gastroenterologist at her next visit.   MDM   Final diagnoses:  Abdominal pain    Patient presents with abdominal pain, nausea. Patient has a notable history of multiple prior medical procedures, including hiatal hernia repair. She has also had multiple prior evaluations for similar pain. Patient's evaluation here is largely reassuring.  There is demonstration of decreased passage of contents from the esophagus into the stomach the most likely is causing her discomfort. Patient started noting no trial of medication for symptom relief, discharged to follow up with gastroenterology after her exam was otherwise largely reassuring, labs and radiographic studies did not demonstrate other concerning findings.     Carmin Muskrat, MD 10/22/13 1125

## 2013-10-28 ENCOUNTER — Ambulatory Visit (INDEPENDENT_AMBULATORY_CARE_PROVIDER_SITE_OTHER): Payer: Medicare HMO | Admitting: Gastroenterology

## 2013-10-28 ENCOUNTER — Encounter: Payer: Self-pay | Admitting: Gastroenterology

## 2013-10-28 ENCOUNTER — Other Ambulatory Visit (HOSPITAL_COMMUNITY): Payer: Self-pay | Admitting: Interventional Radiology

## 2013-10-28 VITALS — BP 118/60 | HR 72 | Ht 63.5 in | Wt 129.2 lb

## 2013-10-28 DIAGNOSIS — M545 Low back pain, unspecified: Secondary | ICD-10-CM

## 2013-10-28 DIAGNOSIS — R110 Nausea: Secondary | ICD-10-CM

## 2013-10-28 NOTE — Patient Instructions (Signed)
Stop Protonix   Call in one week and speak with Jomarie Longs., RN and let her know how you are feeling

## 2013-10-28 NOTE — Progress Notes (Signed)
     10/28/2013 [name] [name] [id number] 1936/01/02   History of Present Illness:  This is a 78 year old female who is previously known to Dr. [name]. She initially had a Nissen fundoplication in 10/6438 by Dr. Dalbert Batman.  She was seen by our service most recently during a hospitalization in November 2014. At that time she was seen for nausea, which was thought to either be secondary to the pain medication she was taking versus reflux with a slipped Nissen fundoplication. She underwent an EGD in November 2014 a which time she was found to have white exudates in the esophagus consistent with candidiasis that was treated with Diflucan. She was also seen to have a slipped Nissen fundoplication with 4-5 cm hernia. Gastric emptying scan at that time was normal. She then subsequently underwent a repeat Nissen fundoplication in December 2014 by Dr. Johney Maine. She's continued to have ongoing nausea since that time. She takes Reglan, Zofran, and Phenergan on as needed basis. The nausea is occurring daily, so she is taking at least one of the medication every day. She is also on pantoprazole 40 mg twice daily. She says that some days she just wakes up feeling sick/nauseated.  Just last month, May 2015, she underwent a CT scan of the abdomen and pelvis with contrast that showed no acute findings. She was in the ER again last week for complaints of nausea vomiting and an upper GI series showed delayed transit of contrast of the esophagus into the stomach with tertiary contractions identified likely secondary to the fundoplication. Patient denies any complaints of dysphasia.   Current Medications, Allergies, Past Medical History, Past Surgical History, Family History and Social History were reviewed in [employer] record.   Physical Exam: BP 118/60  Pulse 72  Ht 5' 3.5" (1.613 m)  Wt 129 lb 3.2 oz (58.605 kg)  BMI 22.53 kg/m2 General: Well developed female in no acute distress Head: Normocephalic  and atraumatic Eyes:  Sclerae anicteric, conjunctiva pink  Ears: Normal auditory acuity Lungs: Clear throughout to auscultation Heart: Regular rate and rhythm Abdomen: Soft, non-distended.  Normal bowel sounds.  Non-tender. Musculoskeletal: Symmetrical with no gross deformities  Extremities: No edema  Neurological: Alert oriented x 4, grossly non-focal Psychological:  Alert and cooperative. Normal mood and affect  Assessment and Recommendations: -Ongoing nausea:  Extensive evaluation from a GI standpoint has been unremarkable.  Discussed with Dr. [name] who recommending discontinuation of the protonix.  If no improvement in approximately one week, then she will call back and will need to proceed with CT scan of the head as a start to non-GI causes of nausea.  Continue anti-emetics prn.

## 2013-10-29 ENCOUNTER — Encounter (HOSPITAL_COMMUNITY)
Admission: RE | Admit: 2013-10-29 | Discharge: 2013-10-29 | Disposition: A | Discharge status: Home / Self Care | Payer: Medicare HMO | Source: Ambulatory Visit | Attending: Interventional Radiology | Admitting: Interventional Radiology

## 2013-10-29 ENCOUNTER — Ambulatory Visit (HOSPITAL_COMMUNITY)
Admission: RE | Admit: 2013-10-29 | Discharge: 2013-10-29 | Disposition: A | Discharge status: Home / Self Care | Payer: Medicare HMO | Source: Ambulatory Visit | Attending: Interventional Radiology | Admitting: Interventional Radiology

## 2013-10-29 DIAGNOSIS — M545 Low back pain, unspecified: Secondary | ICD-10-CM | POA: Insufficient documentation

## 2013-10-29 MED ORDER — TECHNETIUM TC 99M MEDRONATE IV KIT
25.0000 | PACK | Freq: Once | INTRAVENOUS | Status: AC | PRN
  Administered 2013-10-29: 25 via INTRAVENOUS

## 2013-11-04 ENCOUNTER — Telehealth: Payer: Self-pay | Admitting: Gastroenterology

## 2013-11-04 ENCOUNTER — Other Ambulatory Visit: Payer: Self-pay | Admitting: Radiology

## 2013-11-04 ENCOUNTER — Encounter (HOSPITAL_COMMUNITY): Payer: Self-pay | Admitting: Pharmacy Technician

## 2013-11-04 ENCOUNTER — Other Ambulatory Visit (HOSPITAL_COMMUNITY): Payer: Self-pay | Admitting: Interventional Radiology

## 2013-11-04 ENCOUNTER — Other Ambulatory Visit: Payer: Self-pay | Admitting: Physician Assistant

## 2013-11-04 DIAGNOSIS — M549 Dorsalgia, unspecified: Secondary | ICD-10-CM

## 2013-11-04 DIAGNOSIS — IMO0002 Reserved for concepts with insufficient information to code with codable children: Secondary | ICD-10-CM

## 2013-11-04 NOTE — Telephone Encounter (Signed)
Great!  Tell her to continue as she is doing and let us know if the nausea recurs as it was previously.  Thank you,  Jess

## 2013-11-04 NOTE — Telephone Encounter (Signed)
Pt aware.

## 2013-11-04 NOTE — Telephone Encounter (Signed)
Pt states she has been doing pretty good since stopping the protonix. States that she did have some nausea this morning and she took some reglan and it got better. Pt was told to call with an update in 1 week. Alonza Bogus PA notified.

## 2013-11-05 ENCOUNTER — Other Ambulatory Visit (HOSPITAL_COMMUNITY): Payer: Self-pay | Admitting: Interventional Radiology

## 2013-11-05 ENCOUNTER — Encounter (HOSPITAL_COMMUNITY): Payer: Self-pay

## 2013-11-05 ENCOUNTER — Ambulatory Visit (HOSPITAL_COMMUNITY)
Admission: RE | Admit: 2013-11-05 | Discharge: 2013-11-05 | Disposition: A | Discharge status: Home / Self Care | Payer: Medicare HMO | Source: Ambulatory Visit | Attending: Interventional Radiology | Admitting: Interventional Radiology

## 2013-11-05 DIAGNOSIS — J45909 Unspecified asthma, uncomplicated: Secondary | ICD-10-CM | POA: Insufficient documentation

## 2013-11-05 DIAGNOSIS — IMO0002 Reserved for concepts with insufficient information to code with codable children: Secondary | ICD-10-CM

## 2013-11-05 DIAGNOSIS — E785 Hyperlipidemia, unspecified: Secondary | ICD-10-CM | POA: Insufficient documentation

## 2013-11-05 DIAGNOSIS — K219 Gastro-esophageal reflux disease without esophagitis: Secondary | ICD-10-CM | POA: Insufficient documentation

## 2013-11-05 DIAGNOSIS — Z9861 Coronary angioplasty status: Secondary | ICD-10-CM | POA: Insufficient documentation

## 2013-11-05 DIAGNOSIS — M549 Dorsalgia, unspecified: Secondary | ICD-10-CM

## 2013-11-05 DIAGNOSIS — I2510 Atherosclerotic heart disease of native coronary artery without angina pectoris: Secondary | ICD-10-CM | POA: Insufficient documentation

## 2013-11-05 DIAGNOSIS — I10 Essential (primary) hypertension: Secondary | ICD-10-CM | POA: Insufficient documentation

## 2013-11-05 DIAGNOSIS — Z8673 Personal history of transient ischemic attack (TIA), and cerebral infarction without residual deficits: Secondary | ICD-10-CM | POA: Insufficient documentation

## 2013-11-05 DIAGNOSIS — M8448XA Pathological fracture, other site, initial encounter for fracture: Secondary | ICD-10-CM | POA: Insufficient documentation

## 2013-11-05 LAB — CBC
HCT: 40.2 % (ref 36.0–46.0)
Hemoglobin: 13 g/dL (ref 12.0–15.0)
MCH: 29.3 pg (ref 26.0–34.0)
MCHC: 32.3 g/dL (ref 30.0–36.0)
MCV: 90.7 fL (ref 78.0–100.0)
PLATELETS: 305 10*3/uL (ref 150–400)
RBC: 4.43 MIL/uL (ref 3.87–5.11)
RDW: 15.9 % — ABNORMAL HIGH (ref 11.5–15.5)
WBC: 5.6 10*3/uL (ref 4.0–10.5)

## 2013-11-05 LAB — PROTIME-INR
INR: 0.94 (ref 0.00–1.49)
PROTHROMBIN TIME: 12.6 s (ref 11.6–15.2)

## 2013-11-05 LAB — BASIC METABOLIC PANEL
BUN: 19 mg/dL (ref 6–23)
CALCIUM: 9.3 mg/dL (ref 8.4–10.5)
CO2: 26 mEq/L (ref 19–32)
Chloride: 104 mEq/L (ref 96–112)
Creatinine, Ser: 0.73 mg/dL (ref 0.50–1.10)
GFR, EST NON AFRICAN AMERICAN: 81 mL/min — AB (ref >90)
Glucose, Bld: 98 mg/dL (ref 70–99)
Potassium: 4.7 mEq/L (ref 3.7–5.3)
SODIUM: 143 meq/L (ref 137–147)

## 2013-11-05 LAB — APTT: APTT: 25 s (ref 24–37)

## 2013-11-05 MED ORDER — MIDAZOLAM HCL 2 MG/2ML IJ SOLN
INTRAMUSCULAR | Status: AC
  Filled 2013-11-05: qty 4

## 2013-11-05 MED ORDER — GELATIN ABSORBABLE 12-7 MM EX MISC
CUTANEOUS | Status: AC
  Filled 2013-11-05: qty 1

## 2013-11-05 MED ORDER — TRAMADOL HCL 50 MG PO TABS
50.0000 mg | ORAL_TABLET | Freq: Once | ORAL | Status: AC
Start: 2013-11-05 — End: 2013-11-05
  Administered 2013-11-05: 50 mg via ORAL

## 2013-11-05 MED ORDER — SODIUM CHLORIDE 0.9 % IV SOLN: INTRAVENOUS | Status: AC

## 2013-11-05 MED ORDER — CEFAZOLIN SODIUM-DEXTROSE 2-3 GM-% IV SOLR
2.0000 g | INTRAVENOUS | Status: AC
  Administered 2013-11-05: 2 g via INTRAVENOUS

## 2013-11-05 MED ORDER — FENTANYL CITRATE 0.05 MG/ML IJ SOLN
INTRAMUSCULAR | Status: AC
Start: 2013-11-05 — End: 2013-11-05
  Filled 2013-11-05: qty 4

## 2013-11-05 MED ORDER — IOHEXOL 300 MG/ML  SOLN
50.0000 mL | Freq: Once | INTRAMUSCULAR | Status: AC | PRN
  Administered 2013-11-05: 3 mL

## 2013-11-05 MED ORDER — MIDAZOLAM HCL 2 MG/2ML IJ SOLN
INTRAMUSCULAR | Status: AC | PRN
  Administered 2013-11-05 (×2): 1 mg via INTRAVENOUS

## 2013-11-05 MED ORDER — TOBRAMYCIN SULFATE 1.2 G IJ SOLR
INTRAMUSCULAR | Status: AC
  Filled 2013-11-05: qty 1.2

## 2013-11-05 MED ORDER — FENTANYL CITRATE 0.05 MG/ML IJ SOLN
INTRAMUSCULAR | Status: AC | PRN
  Administered 2013-11-05 (×2): 25 ug via INTRAVENOUS

## 2013-11-05 MED ORDER — CEFAZOLIN SODIUM-DEXTROSE 2-3 GM-% IV SOLR
INTRAVENOUS | Status: AC
  Filled 2013-11-05: qty 50

## 2013-11-05 MED ORDER — SODIUM CHLORIDE 0.9 % IV SOLN
INTRAVENOUS | Status: DC
  Administered 2013-11-05: 08:00:00 via INTRAVENOUS

## 2013-11-05 NOTE — Discharge Instructions (Signed)
No stooping,bending or  Lifting more than 10 lbs for 2 weeks. 2.Use walker to ambulate for 2 weeks. 3.RTC in 2 weeks.KYPHOPLASTY/VERTEBROPLASTY DISCHARGE INSTRUCTIONS  Medications: (check all that apply)     Resume all home medications as before procedure.       Resume your (aspirin/Plavix/Coumadin) on.                  Continue your pain medications as prescribed as needed.  Over the next 3-5 days, decrease your pain medication as tolerated.  Over the counter medications (i.e. Tylenol, ibuprofen, and aleve) may be substituted once severe/moderate pain symptoms have subsided.   Wound Care:   Bandages may be removed the day following your procedure.  You may get your incision wet once bandages are removed.  Bandaids may be used to cover the incisions until scab formation.  Topical ointments are optional.    If you develop a fever greater than 101 degrees, have increased skin redness at the incision sites or pus-like oozing from incisions occurring within 1 week of the procedure, contact radiology at [phone] or [phone].    Ice pack to back for 15-20 minutes 2-3 time per day for first 2-3 days post procedure.  The ice will expedite muscle healing and help with the pain from the incisions.   Activity:   Bedrest today with limited activity for 24 hours post procedure.    No driving for 48 hours.    Increase your activity as tolerated after bedrest (with assistance if necessary).    Refrain from any strenuous activity or heavy lifting (greater than 10 lbs.).   Follow up:   Contact radiology at [phone] or [phone] if any questions/concerns.    A physician assistant from radiology will contact you in approximately 1 week.    If a biopsy was performed at the time of your procedure, your referring physician should receive the results in usually 2-3 days.

## 2013-11-05 NOTE — H&P (Signed)
Chief Complaint: "Back pain and left hip pain." HPI: [name] [name] is an 78 y.o. female who previously had a VP at L3 and KP at L4 level on 08/20/13, she complains of persistent lower back to left hip pain that she was having prior to the procedure. She currently is using a walker and taking tramadol (1) tablet every 3 hours for pain with some relief. She rates her current pain 5/10. She denies any further injury since her procedure. She denies any chest pain, shortness of breath or palpitations. She denies any active signs of bleeding or excessive bruising. She denies any recent fevers. The patient denies any history of sleep apnea or chronic oxygen use. She has previously tolerated sedation without complications.    Past Medical History:  Past Medical History  Diagnosis Date  . CAD (coronary artery disease)     LHC 2003 with 40% pLAD, 30% mLAD, 100% D1 (moderate vessel), 100%  D2 (small vessel), 95% mid small OM2.  Pt had PTCA to D1;  D2 and OM2 small vessels and tx medically;  Last Myoview (2012): anterior infarct seen with scar, no ischemia. EF normal. Pt managed medically;  Lexiscan Myoview (12/14):  Low risk; ant scar with very mild peri-infarct ischemia; EF 55% with ant AK   . Recurrent aspiration bronchitis/pneumonia     PFTs 12/09 FVC 97%, FEV1 101%, ratio 77%, TLC 109%. No significant obstruction or restriction.   . Hypertension   . Hyperlipidemia   . Esophageal stricture   . Gastritis   . GERD (gastroesophageal reflux disease)   . Asthma   . Blood transfusion   . Cough   . Wheezing   . Weakness   . Trouble swallowing   . Abdominal pain   . Hiatal hernia   . Shortness of breath   . Anxiety   . Complication of anesthesia   . PONV (postoperative nausea and vomiting)   . TIA (transient ischemic attack)   . BRONCHITIS, ACUTE 07/25/2007         . DIVERTICULOSIS, COLON 08/28/2008    Qualifier: Diagnosis of  By: [name], [name], [name]    . IRON DEFICIENCY ANEMIA, HX OF 07/24/2007   Qualifier: Diagnosis of  By: Tilden Dome      Past Surgical History:  Past Surgical History  Procedure Laterality Date  . Back surgery    . Bladder repair    . Hemorrhoid surgery    . Cholecystectomy      Patient unsure of date.  [name] Appendectomy      patient unsure of date  . Total abdominal hysterectomy  1975    partial  . Coronary angioplasty  11/16/2001  . Eye surgery  11/2000    bilateral cataracts with lens implant  . Hiatal hernia repair  06/03/2011    Procedure: LAPAROSCOPIC REPAIR OF HIATAL HERNIA;  Surgeon: Adin Hector, MD;  Location: [facility];  Service: General;  Laterality: N/A;  . Laparoscopic nissen fundoplication  8/76/8115    Procedure: LAPAROSCOPIC NISSEN FUNDOPLICATION;  Surgeon: Adin Hector, MD;  Location: [facility];  Service: General;  Laterality: N/A;  . Lumbar laminectomy/decompression microdiscectomy Right 06/26/2012    Procedure: LUMBAR LAMINECTOMY/DECOMPRESSION MICRODISCECTOMY;  Surgeon: Jessy Oto, MD;  Location: [facility];  Service: Orthopedics;  Laterality: Right;  RIGHT L4-5 MICRODISCECTOMY  . Esophagogastroduodenoscopy N/A 03/15/2013    Procedure: ESOPHAGOGASTRODUODENOSCOPY (EGD);  Surgeon: Jerene Bears, MD;  Location: [facility];  Service: Endoscopy;  Laterality: N/A;  . Esophageal manometry N/A 03/25/2013  Procedure: ESOPHAGEAL MANOMETRY (EM);  Surgeon: Inda Castle, MD;  Location: [facility];  Service: Endoscopy;  Laterality: N/A;  . Hiatal hernia repair N/A 04/24/2013    Procedure: LAPAROSCOPIC  REPAIR RECURRENT PARASOPHAGEAL HIATAL HERNIA WITH FUNDOPLICATION;  Surgeon: Adin Hector, MD;  Location: [facility];  Service: General;  Laterality: N/A;  . Insertion of mesh N/A 04/24/2013    Procedure: INSERTION OF MESH;  Surgeon: Adin Hector, MD;  Location: [facility];  Service: General;  Laterality: N/A;  . Laparoscopic lysis of adhesions N/A 04/24/2013    Procedure: LAPAROSCOPIC LYSIS OF ADHESIONS;  Surgeon: Adin Hector, MD;  Location: [facility];  Service: General;  Laterality: N/A;    Family History:  Family History  Problem Relation Age of Onset  . Cancer Mother     colon   . Colon cancer Mother 32  . Heart disease Father 72    heart attack    Social History:  reports that she has never smoked. She has never used smokeless tobacco. She reports that she does not drink alcohol or use illicit drugs.  Allergies:  Allergies  Allergen Reactions  . Aspartame Diarrhea and Nausea And Vomiting  . Daliresp [Roflumilast] Nausea And Vomiting  . Hydrocodone Nausea Only  . Levofloxacin Nausea And Vomiting  . Oxycodone Nausea Only  . Tramadol Other (See Comments)    Blurred vision   . Ace Inhibitors Cough    Per Dr Melvyn Novas pulmonology 2013    Medications:   Medication List    ASK your doctor about these medications       ALPRAZolam 0.5 MG tablet  Commonly known as:  XANAX  Take 1 tablet (0.5 mg total) by mouth 3 (three) times daily as needed for sleep or anxiety.     amLODipine 10 MG tablet  Commonly known as:  NORVASC  Take 10 mg by mouth every morning.     gabapentin 300 MG capsule  Commonly known as:  NEURONTIN  Take 1 capsule (300 mg total) by mouth 3 (three) times daily.     losartan 100 MG tablet  Commonly known as:  COZAAR  Take 100 mg by mouth daily.     metoCLOPramide 10 MG tablet  Commonly known as:  REGLAN  Take 5-10 mg by mouth 4 (four) times daily as needed for nausea (and reflux).     metoprolol tartrate 25 MG tablet  Commonly known as:  LOPRESSOR  Take 12.5 mg by mouth at bedtime.     mometasone-formoterol 200-5 MCG/ACT Aero  Commonly known as:  DULERA  Inhale 2 puffs into the lungs 2 (two) times daily.     ondansetron 8 MG disintegrating tablet  Commonly known as:  ZOFRAN ODT  Take 1 tablet (8 mg total) by mouth every 8 (eight) hours as needed for nausea or vomiting. For nausea     OVER THE COUNTER MEDICATION  Take 1,000 mg by mouth daily. Coral Calcium 1084m     OVER THE COUNTER  MEDICATION  Take 400 mg by mouth 3 (three) times daily. Magnesium Carbonate 4018m    polyethylene glycol packet  Commonly known as:  MIRALAX / GLYCOLAX  Take 17 g by mouth 2 (two) times daily.     pravastatin 40 MG tablet  Commonly known as:  PRAVACHOL  Take 40 mg by mouth daily.     promethazine 12.5 MG tablet  Commonly known as:  PHENERGAN  Take 1 tablet (12.5 mg total) by mouth every  6 (six) hours as needed for nausea or vomiting.     traMADol 50 MG tablet  Commonly known as:  ULTRAM  Take by mouth every 6 (six) hours as needed for moderate pain or severe pain.     TUDORZA PRESSAIR 400 MCG/ACT Aepb  Generic drug:  Aclidinium Bromide  Inhale 1 puff into the lungs 2 (two) times daily as needed (wheezing).     Vitamin D3 2000 UNITS Tabs  Take 6,000 Units by mouth daily.       Please HPI for pertinent positives, otherwise complete 10 system ROS negative.  Physical Exam: BP 131/67  Pulse 60  Temp(Src) 98 F (36.7 C) (Oral)  Resp 20  Ht '5\' 5"'  (1.651 m)  Wt 125 lb (56.7 kg)  BMI 20.80 kg/m2  SpO2 97% Body mass index is 20.8 kg/(m^2).  General Appearance:  Alert, cooperative, no distress  Head:  Normocephalic, without obvious abnormality, atraumatic  Neck: Supple, symmetrical, trachea midline  Lungs:   Clear to auscultation bilaterally, no w/r/r, respirations unlabored without use of accessory muscles.  Chest Wall:  No tenderness or deformity  Back: Low lumbar/left hip tenderness to palpation  Heart:  Regular rate and rhythm, S1, S2 normal, no murmur, rub or gallop.  Abdomen:   Soft, non-tender, non distended.  Extremities: Extremities normal, atraumatic, no cyanosis or edema  Neurologic: Normal affect, no gross deficits.   Results for orders placed during the hospital encounter of 11/05/13 (from the past 48 hour(s))  APTT     Status: None   Collection Time    11/05/13  7:01 AM      Result Value Ref Range   aPTT 25  24 - 37 seconds  BASIC METABOLIC PANEL      Status: Abnormal   Collection Time    11/05/13  7:01 AM      Result Value Ref Range   Sodium 143  137 - 147 mEq/L   Potassium 4.7  3.7 - 5.3 mEq/L   Comment: HEMOLYSIS AT THIS LEVEL MAY AFFECT RESULT   Chloride 104  96 - 112 mEq/L   CO2 26  19 - 32 mEq/L   Glucose, Bld 98  70 - 99 mg/dL   BUN 19  6 - 23 mg/dL   Creatinine, Ser 0.73  0.50 - 1.10 mg/dL   Calcium 9.3  8.4 - 10.5 mg/dL   GFR calc non Af Amer 81 (*) >90 mL/min   GFR calc Af Amer >90  >90 mL/min   Comment: (NOTE)     The eGFR has been calculated using the CKD EPI equation.     This calculation has not been validated in all clinical situations.     eGFR's persistently <90 mL/min signify possible Chronic Kidney     Disease.  CBC     Status: Abnormal   Collection Time    11/05/13  7:01 AM      Result Value Ref Range   WBC 5.6  4.0 - 10.5 K/uL   RBC 4.43  3.87 - 5.11 MIL/uL   Hemoglobin 13.0  12.0 - 15.0 g/dL   HCT 40.2  36.0 - 46.0 %   MCV 90.7  78.0 - 100.0 fL   MCH 29.3  26.0 - 34.0 pg   MCHC 32.3  30.0 - 36.0 g/dL   RDW 15.9 (*) 11.5 - 15.5 %   Platelets 305  150 - 400 K/uL  PROTIME-INR     Status: None   Collection Time  11/05/13  7:01 AM      Result Value Ref Range   Prothrombin Time 12.6  11.6 - 15.2 seconds   INR 0.94  0.00 - 1.49   No results found.  Assessment/Plan Persistent low back pain and left hip pain S/p Lumbar 4 KP and Lumbar 3 VP on 08/20/13 Scheduled today for additional bone cement placement to previously treated L3/4 Patient has been NPO, no blood thinners taken, afebrile, labs reviewed. Risks and Benefits discussed with the patient. All of the patient's questions were answered, patient is agreeable to proceed. Consent signed and in chart.   Tsosie Billing D PA-C 11/05/2013, 8:30 AM

## 2013-11-05 NOTE — Procedures (Signed)
S/P VP at L3 and L4

## 2013-11-14 ENCOUNTER — Ambulatory Visit (INDEPENDENT_AMBULATORY_CARE_PROVIDER_SITE_OTHER): Payer: Medicare HMO | Admitting: Emergency Medicine

## 2013-11-14 ENCOUNTER — Encounter: Payer: Self-pay | Admitting: Emergency Medicine

## 2013-11-14 VITALS — BP 116/58 | HR 72 | Temp 98.0°F | Resp 18 | Ht 63.75 in | Wt 127.0 lb

## 2013-11-14 DIAGNOSIS — R062 Wheezing: Secondary | ICD-10-CM

## 2013-11-14 DIAGNOSIS — E782 Mixed hyperlipidemia: Secondary | ICD-10-CM

## 2013-11-14 DIAGNOSIS — I10 Essential (primary) hypertension: Secondary | ICD-10-CM

## 2013-11-14 LAB — CBC WITH DIFFERENTIAL/PLATELET
Basophils Absolute: 0.1 K/uL (ref 0.0–0.1)
Basophils Relative: 1 % (ref 0–1)
Eosinophils Absolute: 0.7 K/uL (ref 0.0–0.7)
Eosinophils Relative: 11 % — ABNORMAL HIGH (ref 0–5)
HCT: 42.5 % (ref 36.0–46.0)
Hemoglobin: 14.2 g/dL (ref 12.0–15.0)
Lymphocytes Relative: 23 % (ref 12–46)
Lymphs Abs: 1.4 K/uL (ref 0.7–4.0)
MCH: 29.3 pg (ref 26.0–34.0)
MCHC: 33.4 g/dL (ref 30.0–36.0)
MCV: 87.6 fL (ref 78.0–100.0)
Monocytes Absolute: 0.6 K/uL (ref 0.1–1.0)
Monocytes Relative: 9 % (ref 3–12)
Neutro Abs: 3.5 K/uL (ref 1.7–7.7)
Neutrophils Relative %: 56 % (ref 43–77)
Platelets: 400 K/uL (ref 150–400)
RBC: 4.85 MIL/uL (ref 3.87–5.11)
RDW: 16.5 % — ABNORMAL HIGH (ref 11.5–15.5)
WBC: 6.3 K/uL (ref 4.0–10.5)

## 2013-11-14 LAB — BASIC METABOLIC PANEL WITHOUT GFR
BUN: 19 mg/dL (ref 6–23)
CO2: 29 meq/L (ref 19–32)
Calcium: 10.1 mg/dL (ref 8.4–10.5)
Chloride: 101 meq/L (ref 96–112)
Creat: 0.78 mg/dL (ref 0.50–1.10)
GFR, Est African American: 85 mL/min
GFR, Est Non African American: 74 mL/min
Glucose, Bld: 93 mg/dL (ref 70–99)
Potassium: 4.9 meq/L (ref 3.5–5.3)
Sodium: 140 meq/L (ref 135–145)

## 2013-11-14 LAB — HEPATIC FUNCTION PANEL
ALT: 44 U/L — ABNORMAL HIGH (ref 0–35)
AST: 29 U/L (ref 0–37)
Albumin: 4.1 g/dL (ref 3.5–5.2)
Alkaline Phosphatase: 115 U/L (ref 39–117)
Bilirubin, Direct: 0.2 mg/dL (ref 0.0–0.3)
Indirect Bilirubin: 0.4 mg/dL (ref 0.2–1.2)
Total Bilirubin: 0.6 mg/dL (ref 0.2–1.2)
Total Protein: 6.8 g/dL (ref 6.0–8.3)

## 2013-11-14 LAB — LIPID PANEL
Cholesterol: 109 mg/dL (ref 0–200)
HDL: 57 mg/dL (ref >39)
LDL Cholesterol: 42 mg/dL (ref 0–99)
Total CHOL/HDL Ratio: 1.9 ratio
Triglycerides: 49 mg/dL (ref <150)
VLDL: 10 mg/dL (ref 0–40)

## 2013-11-14 MED ORDER — AZITHROMYCIN 250 MG PO TABS: ORAL_TABLET | ORAL | Status: AC

## 2013-11-14 NOTE — Progress Notes (Signed)
Subjective:    Patient ID: [name] [name], female    DOB: October 16, 1935, 78 y.o.   MRN: [id number]  HPI Comments: 78 yo WF presents for 3 month F/U for HTN, Cholesterol, Pre-Dm, D. Deficient. She has not been exercising as much with back pain. She is eating healthy and stomach has improved significantly. She notes BP good at home. She notes BS <100 at home, she thinks last A1c elevated due to diet she had to have with stomach issues. She has been taking TRamadol 1 pill every 3 hours max 4 daily and notes still with some break through pain.   She has been wheezing more over last week. She notes increased thick production x 2 days. She notes back surgery 1 week ago was given abx which helped with lung congestion.   WBC             5.6   11/05/2013 HGB            13.0   11/05/2013 HCT            40.2   11/05/2013 PLT             305   11/05/2013 GLUCOSE          98   11/05/2013 CHOL            123   08/14/2013 TRIG             61   08/14/2013 HDL              62   08/14/2013 LDLCALC          49   08/14/2013 ALT              17   09/27/2013 AST              14   09/27/2013 NA              143   11/05/2013 K               4.7   11/05/2013 CL              104   11/05/2013 CREATININE     0.73   11/05/2013 BUN              19   11/05/2013 CO2              26   11/05/2013 TSH           0.962   09/20/2013 INR            0.94   11/05/2013 HGBA1C          6.1   09/20/2013   Hyperlipidemia  Hypertension     Medication List       This list is accurate as of: 11/14/13 10:16 AM.  Always use your most recent med list.               ALPRAZolam 0.5 MG tablet  Commonly known as:  XANAX  Take 1 tablet (0.5 mg total) by mouth 3 (three) times daily as needed for sleep or anxiety.     amLODipine 10 MG tablet  Commonly known as:  NORVASC  Take 10 mg by mouth every morning.     gabapentin 300 MG capsule  Commonly known as:  NEURONTIN  Take 1 capsule (300 mg total) by mouth 3 (three) times daily.     losartan 100 MG  tablet  Commonly known as:  COZAAR  Take 100 mg by mouth daily.     metoCLOPramide 10 MG tablet  Commonly known as:  REGLAN  Take 5-10 mg by mouth 4 (four) times daily as needed for nausea (and reflux).     metoprolol tartrate 25 MG tablet  Commonly known as:  LOPRESSOR  Take 12.5 mg by mouth at bedtime.     mometasone-formoterol 200-5 MCG/ACT Aero  Commonly known as:  DULERA  Inhale 2 puffs into the lungs 2 (two) times daily.     ondansetron 8 MG disintegrating tablet  Commonly known as:  ZOFRAN ODT  Take 1 tablet (8 mg total) by mouth every 8 (eight) hours as needed for nausea or vomiting. For nausea     OVER THE COUNTER MEDICATION  Take 1,000 mg by mouth daily. Coral Calcium 1000mg      OVER THE COUNTER MEDICATION  Take 400 mg by mouth 3 (three) times daily. Magnesium Carbonate 400mg      polyethylene glycol packet  Commonly known as:  MIRALAX / GLYCOLAX  Take 17 g by mouth 2 (two) times daily.     pravastatin 40 MG tablet  Commonly known as:  PRAVACHOL  Take 40 mg by mouth daily.     promethazine 12.5 MG tablet  Commonly known as:  PHENERGAN  Take 1 tablet (12.5 mg total) by mouth every 6 (six) hours as needed for nausea or vomiting.     traMADol 50 MG tablet  Commonly known as:  ULTRAM  Take by mouth every 6 (six) hours as needed for moderate pain or severe pain.     TUDORZA PRESSAIR 400 MCG/ACT Aepb  Generic drug:  Aclidinium Bromide  Inhale 1 puff into the lungs 2 (two) times daily as needed (wheezing).     Vitamin D3 2000 UNITS Tabs  Take 6,000 Units by mouth daily.       Allergies  Allergen Reactions  . Aspartame Diarrhea and Nausea And Vomiting  . Daliresp [Roflumilast] Nausea And Vomiting  . Hydrocodone Nausea Only  . Levofloxacin Nausea And Vomiting  . Oxycodone Nausea Only  . Tramadol Other (See Comments)    Blurred vision   . Ace Inhibitors Cough    Per Dr Melvyn Novas pulmonology 2013   Past Medical History  Diagnosis Date  . CAD (coronary artery  disease)     LHC 2003 with 40% pLAD, 30% mLAD, 100% D1 (moderate vessel), 100%  D2 (small vessel), 95% mid small OM2.  Pt had PTCA to D1;  D2 and OM2 small vessels and tx medically;  Last Myoview (2012): anterior infarct seen with scar, no ischemia. EF normal. Pt managed medically;  Lexiscan Myoview (12/14):  Low risk; ant scar with very mild peri-infarct ischemia; EF 55% with ant AK   . Recurrent aspiration bronchitis/pneumonia     PFTs 12/09 FVC 97%, FEV1 101%, ratio 77%, TLC 109%. No significant obstruction or restriction.   . Hypertension   . Hyperlipidemia   . Esophageal stricture   . Gastritis   . GERD (gastroesophageal reflux disease)   . Asthma   . Blood transfusion   . Cough   . Wheezing   . Weakness   . Trouble swallowing   . Abdominal pain   . Hiatal hernia   . Shortness of breath   . Anxiety   . Complication of anesthesia   . PONV (postoperative nausea and vomiting)   . TIA (transient ischemic attack)   . BRONCHITIS, ACUTE 07/25/2007         .  DIVERTICULOSIS, COLON 08/28/2008    Qualifier: Diagnosis of  By: [name], [name], [name]    . IRON DEFICIENCY ANEMIA, HX OF 07/24/2007    Qualifier: Diagnosis of  By: Tilden Dome        Review of Systems  Respiratory: Positive for cough.   Musculoskeletal: Positive for back pain.  All other systems reviewed and are negative.  BP 116/58  Pulse 72  Temp(Src) 98 F (36.7 C) (Temporal)  Resp 18  Ht 5' 3.75" (1.619 m)  Wt 127 lb (57.607 kg)  BMI 21.98 kg/m2     Objective:   Physical Exam  Nursing note and vitals reviewed. Constitutional: She is oriented to person, place, and time. She appears well-developed and well-nourished. No distress.  HENT:  Head: Normocephalic and atraumatic.  Right Ear: External ear normal.  Left Ear: External ear normal.  Nose: Nose normal.  Mouth/Throat: Oropharynx is clear and moist.  Eyes: Conjunctivae and EOM are normal.  Neck: Normal range of motion. Neck supple. No JVD present. No  thyromegaly present.  Cardiovascular: Normal rate, regular rhythm, normal heart sounds and intact distal pulses.   Pulmonary/Chest: Effort normal. She has wheezes.  ? Ronchi  Abdominal: Soft. Bowel sounds are normal. She exhibits no distension and no mass. There is no tenderness. There is no rebound and no guarding.  Musculoskeletal: Normal range of motion. She exhibits no edema and no tenderness.  Lymphadenopathy:    She has no cervical adenopathy.  Neurological: She is alert and oriented to person, place, and time. No cranial nerve deficit.  Skin: Skin is warm and dry. No rash noted. No erythema. No pallor.  Psychiatric: She has a normal mood and affect. Her behavior is normal. Judgment and thought content normal.          Assessment & Plan:  1.  3 month F/U for HTN, Cholesterol, Pre-Dm, D. Deficient. Needs healthy diet, cardio QD and obtain healthy weight. Check Labs, Check BP if >130/80 call office   2. ? Rhonchi/ Wheezing- WIth recent IV ABX unknown will try [id number] Dulera #2 AD  3. Back pain- needs to f/u Ortho

## 2013-11-14 NOTE — Patient Instructions (Signed)
Pneumonia, Adult  Pneumonia is an infection of the lungs. It may be caused by a germ (virus or bacteria). Some types of pneumonia can spread easily from person to person. This can happen when you cough or sneeze.  HOME CARE   Only take medicine as told by your doctor.   Take your medicine (antibiotics) as told. Finish it even if you start to feel better.   Do not smoke.   You may use a vaporizer or humidifier in your room. This can help loosen thick spit (mucus).   Sleep so you are almost sitting up (semi-upright). This helps reduce coughing.   Rest.  A shot (vaccine) can help prevent pneumonia. Shots are often advised for:   People over 65 years old.   Patients on chemotherapy.   People with long-term (chronic) lung problems.   People with immune system problems.  GET HELP RIGHT AWAY IF:    You are getting worse.   You cannot control your cough, and you are losing sleep.   You cough up blood.   Your pain gets worse, even with medicine.   You have a fever.   Any of your problems are getting worse, not better.   You have shortness of breath or chest pain.  MAKE SURE YOU:    Understand these instructions.   Will watch your condition.   Will get help right away if you are not doing well or get worse.  Document Released: 10/12/2007 Document Revised: 07/18/2011 Document Reviewed: 07/16/2010  Exit[employer] Patient Information 2015 [employer]. This information is not intended to replace advice given to you by your health care provider. Make sure you discuss any questions you have with your health care provider.

## 2013-11-15 ENCOUNTER — Other Ambulatory Visit (HOSPITAL_COMMUNITY): Payer: Self-pay | Admitting: Interventional Radiology

## 2013-11-15 DIAGNOSIS — IMO0002 Reserved for concepts with insufficient information to code with codable children: Secondary | ICD-10-CM

## 2013-11-15 DIAGNOSIS — M549 Dorsalgia, unspecified: Secondary | ICD-10-CM

## 2013-11-19 ENCOUNTER — Telehealth: Payer: Self-pay | Admitting: Pulmonary Disease

## 2013-11-19 ENCOUNTER — Encounter: Payer: Self-pay | Admitting: Internal Medicine

## 2013-11-19 ENCOUNTER — Telehealth: Payer: Self-pay

## 2013-11-19 ENCOUNTER — Ambulatory Visit (INDEPENDENT_AMBULATORY_CARE_PROVIDER_SITE_OTHER): Payer: Medicare HMO | Admitting: Internal Medicine

## 2013-11-19 VITALS — BP 112/60 | HR 95 | Temp 97.4°F | Ht 65.0 in | Wt 132.0 lb

## 2013-11-19 DIAGNOSIS — J45909 Unspecified asthma, uncomplicated: Secondary | ICD-10-CM

## 2013-11-19 MED ORDER — PREDNISONE 10 MG PO TABS: ORAL_TABLET | ORAL | Status: DC

## 2013-11-19 MED ORDER — AMOXICILLIN-POT CLAVULANATE 875-125 MG PO TABS: 1.0000 | ORAL_TABLET | Freq: Two times a day (BID) | ORAL | Status: DC

## 2013-11-19 NOTE — Patient Instructions (Signed)
Augmentin 875 mg take one pill twice daily  X 10 days - take at breakfast and supper with large glass of water.  It would help reduce the usual side effects (diarrhea and yeast infections) if you ate cultured yogurt at lunch.   Prednisone 10 mg take  4 each am x 2 days,   2 each am x 2 days,  1 each am x 2 days and stop   Work on inhaler technique:  relax and gently blow all the way out then take a nice smooth deep breath back in, triggering the inhaler at same time you start breathing in.  Hold for up to 5 seconds if you can.  Rinse and gargle with water when done  Return in 2 weeks if not completely better

## 2013-11-19 NOTE — Assessment & Plan Note (Addendum)
DDX of  difficult airways management all start with A and  include Adherence, Ace Inhibitors, Acid Reflux, Active Sinus Disease, Alpha 1 Antitripsin deficiency, Anxiety masquerading as Airways dz,  ABPA,  allergy(esp in young), Aspiration (esp in elderly), Adverse effects of DPI,  Active smokers, plus two Bs  = Bronchiectasis and Beta blocker use..and one C= CHF  Adherence is always the initial "prime suspect" and is a multilayered concern that requires a "trust but verify" approach in every patient - starting with knowing how to use medications, especially inhalers, correctly, keeping up with refills and understanding the fundamental difference between maintenance and prns vs those medications only taken for a very short course and then stopped and not refilled.  - The proper method of use, as well as anticipated side effects, of a metered-dose inhaler are discussed and demonstrated to the patient. Improved effectiveness after extensive coaching during this visit to a level of approximately  50%   ? Allergy > Prednisone 10 mg take  4 each am x 2 days,   2 each am x 2 days,  1 each am x 2 days and stop  ? Active sinus dz >  Augmentin 875 mg take one pill twice daily  X 10 days

## 2013-11-19 NOTE — Telephone Encounter (Signed)
Patient advised to schedule appointment with pulmonologist.

## 2013-11-19 NOTE — Telephone Encounter (Signed)
Patient was in last week and was given zpak. No relief, symptoms worse. Increased back pain and wheezing. Please advise.

## 2013-11-19 NOTE — Progress Notes (Signed)
Subjective:    Patient ID: [name] [name], female    DOB: [birth year], 78 y.o.   MRN: [id number]  HPI  33 yowf never smoker with large hiatal hernia & recurrent asthmatic bronchitis , about twice/yr for many yrs. Lung parenchyma looks ok on Ct scans 6/08 & 12/10. PFTs nml in 12/09 & mild airway obstruction in 4/11.  First presented 3/09 . CT neck for thyroid imaging s/o mediastinal contour abnormal. Ct chest >> large hiatal hernia & BL upper lobe hazy , ground glass opacity.  Dec, 2010  Deatra [name])  Fe def anemia requiring transfusions, small bowel AVMs suspected.  Mar 2011 Acute visit- c/o cough and wheezing >> augmentin helped, enalapril changed to diovan   Apr 2011 PFTs, c/o dyspnea, HB nml, wheezing +, does not desat on exertion.  January 25, 2010>>acute office visit. Complains of c.o cough with production with thick dark green, wheezing chest tightness, sob this am before taking neb. >>rx Avelox.    02/20/2012 She did well 2012  Hernia surgery 1/13, remains on protonix Acute OV 01/13/12 -- after exposure to dust, pollen  >> pred, zpak, duonebs - no better on fu visit in 2 weeks -->> dulera, prednisone, benicar instead of enalapril (this had been restarted), augmentin Improved but back again 4 wks later with c/o Sinus pressure, nasal congestion, PND, cough w/ yellow-green phlem,  fever off and on, chills, slight chest tx off and on. no SOB, chest tx.  They are planning a trip to [facility]ge GERD is well controlled CXR 01/10/12 wnl >>steroid taper   04/02/2012 Follow up  Patient returns for a one-month followup Says she's been doing well up until last week, when she developed increased cough, productive, with thick, yellow green mucus, nasal congestion, and drainage, She's been using Mucinex and Delsym without much relief. Over-the-counter rec Omnicef 300mg  Twice daily  For 7 days  Mucinex Twice daily  As needed  Cough/congestion  Prednisone taper over one week.       11/19/2013 acute   ov/[name] [name] re:  [name] [name] since July 4th 2015 w/e Chief Complaint  Patient presents with  . Acute Visit    Pt c/o cough, congestion and sinus pressure x 1 wk. Cough is prod with minimal to moderate greenish yellow sputum.     symptoms were acute "like a head cold" then into chest, progressive, with sob with exertion but not at rest Zpak July 10  [facility] 2 weeks prior to OV   got kyphoplasty, then sinus congestion Used neb am of of with good results   No obvious day to day or daytime variabilty or assoc   cp or chest tightness, subjective wheeze or overt   hb symptoms. No unusual exp hx or h/o childhood pna/ asthma or knowledge of premature birth.  Sleeping ok without nocturnal  or early am exacerbation  of respiratory  c/o's or need for noct saba. Also denies any obvious fluctuation of symptoms with weather or environmental changes or other aggravating or alleviating factors except as outlined above   Current Medications, Allergies, Complete Past Medical History, Past Surgical History, Family History, and Social History were reviewed in [employer] record.  ROS  The following are not active complaints unless bolded sore throat, dysphagia, dental problems, itching, sneezing,  nasal congestion or excess/ purulent secretions, ear ache,   fever, chills, sweats, unintended wt loss, pleuritic or exertional cp, hemoptysis,  orthopnea pnd or leg swelling, presyncope, palpitations, heartburn, abdominal pain, anorexia, nausea, vomiting, diarrhea  or  change in bowel or urinary habits, change in stools or urine, dysuria,hematuria,  rash, arthralgias, visual complaints, headache, numbness weakness or ataxia or problems with walking or coordination,  change in mood/affect or memory.               Objective:   Physical Exam  amb wf nad  Wt Readings from Last 3 Encounters:  11/19/13 132 lb (59.875 kg)  11/14/13 127 lb (57.607 kg)  11/05/13 125 lb (56.7 kg)      Gen. Pleasant,  well-nourished, in no distress ENT - no lesions, no post nasal drip Neck: No JVD, no thyromegaly, no carotid bruits Lungs: no use of accessory muscles, no dullness to percussion, exp wheezing bilaterally  Cardiovascular: Rhythm regular, heart sounds  normal, no murmurs or gallops, no peripheral edema Musculoskeletal: No deformities, no cyanosis or clubbing         Assessment & Plan:

## 2013-11-19 NOTE — Telephone Encounter (Signed)
Called spoke with spouse. appt scheduled to see MW this afternoon at 3:45. Nothing further neeed

## 2013-11-20 ENCOUNTER — Emergency Department (HOSPITAL_COMMUNITY): Payer: Medicare HMO

## 2013-11-20 ENCOUNTER — Telehealth: Payer: Self-pay | Admitting: Internal Medicine

## 2013-11-20 ENCOUNTER — Emergency Department (HOSPITAL_COMMUNITY)
Admission: EM | Admit: 2013-11-20 | Discharge: 2013-11-20 | Disposition: A | Discharge status: Home / Self Care | Payer: Medicare HMO | Attending: Emergency Medicine | Admitting: Emergency Medicine

## 2013-11-20 ENCOUNTER — Telehealth: Payer: Self-pay | Admitting: *Deleted

## 2013-11-20 ENCOUNTER — Encounter (HOSPITAL_COMMUNITY): Payer: Self-pay | Admitting: Emergency Medicine

## 2013-11-20 DIAGNOSIS — T148XXA Other injury of unspecified body region, initial encounter: Secondary | ICD-10-CM | POA: Insufficient documentation

## 2013-11-20 DIAGNOSIS — R053 Chronic cough: Secondary | ICD-10-CM

## 2013-11-20 DIAGNOSIS — I10 Essential (primary) hypertension: Secondary | ICD-10-CM | POA: Insufficient documentation

## 2013-11-20 DIAGNOSIS — Z9889 Other specified postprocedural states: Secondary | ICD-10-CM | POA: Insufficient documentation

## 2013-11-20 DIAGNOSIS — Z9861 Coronary angioplasty status: Secondary | ICD-10-CM | POA: Insufficient documentation

## 2013-11-20 DIAGNOSIS — F411 Generalized anxiety disorder: Secondary | ICD-10-CM | POA: Insufficient documentation

## 2013-11-20 DIAGNOSIS — J45901 Unspecified asthma with (acute) exacerbation: Secondary | ICD-10-CM | POA: Insufficient documentation

## 2013-11-20 DIAGNOSIS — R110 Nausea: Secondary | ICD-10-CM | POA: Insufficient documentation

## 2013-11-20 DIAGNOSIS — Y929 Unspecified place or not applicable: Secondary | ICD-10-CM | POA: Insufficient documentation

## 2013-11-20 DIAGNOSIS — K219 Gastro-esophageal reflux disease without esophagitis: Secondary | ICD-10-CM | POA: Insufficient documentation

## 2013-11-20 DIAGNOSIS — X58XXXA Exposure to other specified factors, initial encounter: Secondary | ICD-10-CM | POA: Insufficient documentation

## 2013-11-20 DIAGNOSIS — Z79899 Other long term (current) drug therapy: Secondary | ICD-10-CM | POA: Insufficient documentation

## 2013-11-20 DIAGNOSIS — M546 Pain in thoracic spine: Secondary | ICD-10-CM

## 2013-11-20 DIAGNOSIS — Y939 Activity, unspecified: Secondary | ICD-10-CM | POA: Insufficient documentation

## 2013-11-20 DIAGNOSIS — R059 Cough, unspecified: Secondary | ICD-10-CM | POA: Insufficient documentation

## 2013-11-20 DIAGNOSIS — E785 Hyperlipidemia, unspecified: Secondary | ICD-10-CM | POA: Insufficient documentation

## 2013-11-20 DIAGNOSIS — Z87828 Personal history of other (healed) physical injury and trauma: Secondary | ICD-10-CM | POA: Insufficient documentation

## 2013-11-20 DIAGNOSIS — R05 Cough: Secondary | ICD-10-CM

## 2013-11-20 DIAGNOSIS — G8929 Other chronic pain: Secondary | ICD-10-CM | POA: Insufficient documentation

## 2013-11-20 DIAGNOSIS — I2510 Atherosclerotic heart disease of native coronary artery without angina pectoris: Secondary | ICD-10-CM | POA: Insufficient documentation

## 2013-11-20 DIAGNOSIS — Z862 Personal history of diseases of the blood and blood-forming organs and certain disorders involving the immune mechanism: Secondary | ICD-10-CM | POA: Insufficient documentation

## 2013-11-20 HISTORY — DX: Nausea: R11.0

## 2013-11-20 HISTORY — DX: Dorsalgia, unspecified: M54.9

## 2013-11-20 HISTORY — DX: Other chronic pain: G89.29

## 2013-11-20 LAB — COMPREHENSIVE METABOLIC PANEL
ALT: 30 U/L (ref 0–35)
AST: 24 U/L (ref 0–37)
Albumin: 4.4 g/dL (ref 3.5–5.2)
Alkaline Phosphatase: 121 U/L — ABNORMAL HIGH (ref 39–117)
Anion gap: 17 — ABNORMAL HIGH (ref 5–15)
BUN: 9 mg/dL (ref 6–23)
CALCIUM: 9.7 mg/dL (ref 8.4–10.5)
CO2: 23 mEq/L (ref 19–32)
CREATININE: 0.61 mg/dL (ref 0.50–1.10)
Chloride: 99 mEq/L (ref 96–112)
GFR, EST NON AFRICAN AMERICAN: 86 mL/min — AB (ref >90)
GLUCOSE: 133 mg/dL — AB (ref 70–99)
Potassium: 4.2 mEq/L (ref 3.7–5.3)
SODIUM: 139 meq/L (ref 137–147)
Total Bilirubin: 0.5 mg/dL (ref 0.3–1.2)
Total Protein: 8 g/dL (ref 6.0–8.3)

## 2013-11-20 LAB — CBC WITH DIFFERENTIAL/PLATELET
BASOS ABS: 0 10*3/uL (ref 0.0–0.1)
Basophils Relative: 0 % (ref 0–1)
EOS PCT: 0 % (ref 0–5)
Eosinophils Absolute: 0 10*3/uL (ref 0.0–0.7)
HCT: 44.2 % (ref 36.0–46.0)
Hemoglobin: 15 g/dL (ref 12.0–15.0)
Lymphocytes Relative: 8 % — ABNORMAL LOW (ref 12–46)
Lymphs Abs: 0.6 10*3/uL — ABNORMAL LOW (ref 0.7–4.0)
MCH: 29.9 pg (ref 26.0–34.0)
MCHC: 33.9 g/dL (ref 30.0–36.0)
MCV: 88 fL (ref 78.0–100.0)
MONO ABS: 0.1 10*3/uL (ref 0.1–1.0)
Monocytes Relative: 1 % — ABNORMAL LOW (ref 3–12)
Neutro Abs: 7 10*3/uL (ref 1.7–7.7)
Neutrophils Relative %: 91 % — ABNORMAL HIGH (ref 43–77)
PLATELETS: 375 10*3/uL (ref 150–400)
RBC: 5.02 MIL/uL (ref 3.87–5.11)
RDW: 15.7 % — AB (ref 11.5–15.5)
WBC: 7.6 10*3/uL (ref 4.0–10.5)

## 2013-11-20 LAB — LIPASE, BLOOD: LIPASE: 8 U/L — AB (ref 11–59)

## 2013-11-20 LAB — TROPONIN I

## 2013-11-20 LAB — D-DIMER, QUANTITATIVE (NOT AT ARMC): D DIMER QUANT: 1.43 ug{FEU}/mL — AB (ref 0.00–0.48)

## 2013-11-20 MED ORDER — ONDANSETRON 4 MG PO TBDP
8.0000 mg | ORAL_TABLET | Freq: Once | ORAL | Status: AC
  Administered 2013-11-20: 8 mg via ORAL
  Filled 2013-11-20: qty 2

## 2013-11-20 MED ORDER — ONDANSETRON HCL 4 MG/2ML IJ SOLN
4.0000 mg | INTRAMUSCULAR | Status: AC | PRN
  Administered 2013-11-20 (×2): 4 mg via INTRAVENOUS
  Filled 2013-11-20 (×2): qty 2

## 2013-11-20 MED ORDER — MORPHINE SULFATE 2 MG/ML IJ SOLN
1.0000 mg | INTRAMUSCULAR | Status: AC | PRN
  Administered 2013-11-20 (×2): 1 mg via INTRAVENOUS
  Filled 2013-11-20 (×2): qty 1

## 2013-11-20 MED ORDER — IPRATROPIUM-ALBUTEROL 0.5-2.5 (3) MG/3ML IN SOLN
3.0000 mL | Freq: Once | RESPIRATORY_TRACT | Status: AC
  Administered 2013-11-20: 3 mL via RESPIRATORY_TRACT
  Filled 2013-11-20: qty 3

## 2013-11-20 MED ORDER — SODIUM CHLORIDE 0.9 % IV SOLN
INTRAVENOUS | Status: DC
  Administered 2013-11-20: 19:00:00 via INTRAVENOUS

## 2013-11-20 MED ORDER — IOHEXOL 350 MG/ML SOLN
100.0000 mL | Freq: Once | INTRAVENOUS | Status: AC | PRN
  Administered 2013-11-20: 100 mL via INTRAVENOUS

## 2013-11-20 NOTE — Telephone Encounter (Signed)
Yes with a very large glass of water with something to eat

## 2013-11-20 NOTE — ED Notes (Addendum)
Pt reports having appt tomorrow regarding her back. Pt was coughing this afternoon and then sudden onset of right side mid back pain, having n/v and sob. Airway intact at triage, spo2 95%. Reports onset of nausea after taking antibiotics this am, went to pcp yesterday and started on meds for cough.

## 2013-11-20 NOTE — ED Notes (Signed)
Patient transported to CT 

## 2013-11-20 NOTE — Discharge Instructions (Signed)
°Emergency Department Resource Guide °1) Find a Doctor and Pay Out of Pocket °Although you won't have to find out who is covered by your insurance plan, it is a good idea to ask around and get recommendations. You will then need to call the office and see if the doctor you have chosen will accept you as a new patient and what types of options they offer for patients who are self-pay. Some doctors offer discounts or will set up payment plans for their patients who do not have insurance, but you will need to ask so you aren't surprised when you get to your appointment. ° °2) Contact Your Local Health Department °Not all health departments have doctors that can see patients for sick visits, but many do, so it is worth a call to see if yours does. If you don't know where your local health department is, you can check in your phone book. The CDC also has a tool to help you locate your state's health department, and many state websites also have listings of all of their local health departments. ° °3) Find a Walk-in Clinic °If your illness is not likely to be very severe or complicated, you may want to try a walk in clinic. These are popping up all over the country in pharmacies, drugstores, and shopping centers. They're usually staffed by nurse practitioners or physician assistants that have been trained to treat common illnesses and complaints. They're usually fairly quick and inexpensive. However, if you have serious medical issues or chronic medical problems, these are probably not your best option. ° °No Primary Care Doctor: °- Call [facility] at  832-8000 - they can help you locate a primary care doctor that  accepts your insurance, provides certain services, etc. °- Physician Referral Service- 1-800-533-3463 ° °Chronic Pain Problems: °Organization         Address  Phone   Notes  °[facility]  (336) 297-2271 Patients need to be referred by their primary care doctor.  ° °Medication  Assistance: °Organization         Address  Phone   Notes  °[place] [facility] [address] °[place], [place] [address] (336) 641-8030 --Must be a resident of [place] °-- Must have NO insurance coverage whatsoever (no Medicaid/ Medicare, etc.) °-- The pt. MUST have a primary care doctor that directs their care regularly and follows them in the community °  °[facility]  (866) 331-1348   °[employer]  (888) 892-1162   ° °Agencies that provide inexpensive medical care: °Organization         Address  Phone   Notes  °[facility]  (336) 832-8035   °[facility]    (336) 832-7272   °[facility] [address] °[place], [place] [address] (336) 832-4777   °[facility] [address], °[place] (336) 271-4999   °[facility]    (336) 373-0678   °[facility]    (336) 272-1050   °[facility] ° [address], [place] Phone:  (336) 832-4444, Fax:  (336) 832-4440 Hours of Operation:  9 am - 6 pm, M-F.  Also accepts Medicaid/Medicare and self-pay.  °[facility] ° [address], [place] Phone: (336) 832-3150, Fax: (336) [phone]. Hours of Operation:  8:30 am - 5:30 pm, M-F.  Also accepts Medicaid and self-pay.  °[facility] [place] [address], [place] Phone: (336) 878-6027   °[facility] [address], [place], [place] (336)723-1848, Ext. 123 Mondays & Thursdays: 7-9 AM.  First 15 patients are seen on a first come, first serve basis. °  ° °Medicaid-accepting [place] Providers: ° °Organization         Address  Phone   Notes  °[facility] [address], [place] (336) 641-2100 Also accepts self-pay patients.  °[facility] [address], [place] ° (336) 856-9996   °[facility] [address], [place]  (336) 288-8857   °[facility] [address] [place] [address], [place] (336) 299-7000   °Veita Bland [address], [place]  ° (336) 373-1557 Only accepts [place] Access Medicaid patients after they have their name applied to their card.  ° °Self-Pay (no insurance) in [place]: ° °Organization         Address  Phone   Notes  °Sickle Cell Patients, [facility] [address], Campus (336) 832-1970   °[facility] [address], [place] (336) 832-4400   °[facility] [place] ° [address], [place] (336) 992-4800   °[facility]/Dr. Osei-Bonsu ° [address] [place] [address], [place] or [address], [place] (336) 841-8500 Phone number for both [place] and [place] locations is the same.  °[facility] [address], [place] (336) 299-0000   °[facility] [address] [place] [address], [place] or [address] (336) 852-7530 °(336) 878-2260   °[facility] [address], [place] (336) 350-1642, phone; (336) 294-5005, fax Sees patients 1st and 3rd Saturday of every month.  Must not qualify for public or private insurance (i.e. Medicaid, Medicare, [place] Health Choice, Veterans' Benefits) • Household income should be no more than 200% of the poverty level •The clinic cannot treat you if you are pregnant or think you are pregnant • Sexually transmitted diseases are not treated at the clinic.  ° ° °Dental Care: °Organization         Address  Phone  Notes  °[place] [facility] [address], [place] (336) 641-6152 Accepts children up to age 21 who are enrolled in Medicaid or [place] Health Choice; pregnant women with a Medicaid card; and children who have applied for Medicaid or [place] Health Choice, but were declined, whose parents can pay a reduced fee at time of service.  °[place]  [facility] [place]  [address], [place] (336) 641-7733 Accepts children up to age 21 who are enrolled in Medicaid or [place] Health Choice; pregnant women with a Medicaid card; and children who have applied for Medicaid or  Health Choice, but were declined, whose parents can pay a reduced fee at time of service.  °[facility] ° [address], [place] (336) 641-4533 Patients are seen by appointment only. Walk-ins are not accepted. [facility] will see patients 18 years of age and older. °Monday - Tuesday (8am-5pm) °Most Wednesdays (8:30-5pm) °$30 per visit, cash only  °[facility] ° [address], [place] (336) 641-4533 Patients are seen by appointment only. Walk-ins are not accepted. [facility] will see patients 18 years of age and older. °One   Wednesday Evening (Monthly: Volunteer Based).  $30 per visit, cash only  °[facility]  (919) 537-3737 for adults; Children under age 4, call Graduate Pediatric Dentistry at (919) [phone]. Children aged 4-14, please call (919) [phone] to request a pediatric application. ° Dental services are provided in all areas of dental care including fillings, crowns and bridges, complete and partial dentures, implants, gum treatment, root canals, and extractions. Preventive care is also provided. Treatment is provided to both adults and children. °Patients are selected via a lottery and there is often a waiting list. °  °[facility] [address], °[place] ° (336) 763-8833 [email or web address] °  °[facility] [address], [place], [place] (336)723-1848, Ext. 123 Second and Fourth Thursday of each month, opens at 6:30 AM; Clinic ends at 9 AM.  Patients are seen on a first-come first-served basis, and a limited number are seen during each clinic.  ° °[facility] ° [address], [place], [place] (336) 723-7904    Eligibility Requirements °You must have lived in [place], [place], or [place] counties for at least the last three months. °  You cannot be eligible for state or federal sponsored healthcare insurance, including Veterans Administration, Medicaid, or Medicare. °  You generally cannot be eligible for healthcare insurance through your employer.  °  How to apply: °Eligibility screenings are held every Tuesday and Wednesday afternoon from 1:00 pm until 4:00 pm. You do not need an appointment for the interview!  °[facility] [address], [place], [place] 336-631-2330   °[facility]  336-342-8273   °[facility]  336-703-3100   °[facility]  336-570-6415   ° °Behavioral Health Resources in the Community: °Intensive Outpatient Programs °Organization         Address  Phone  Notes  °[facility] [address], [place], [place] 336-878-6098   °[facility] [address], [place], [place] 336-832-9800   °[facility] [address], [place], [place] ° 336-882-2125   °[facility] [address],  °[place], [place] 1-800-853-5163 or 336-641-4981   °Substance Abuse Resources °Organization         Address  Phone  Notes  °[facility]  336-882-2125   °[facility]  336-784-9470   °[facility]  336-285-9073   °[facility]  336-845-3988   °Residential & Outpatient Substance Abuse Program  1-800-659-3381   °Psychological Services °Organization         Address  Phone  Notes  °[facility]  336- 832-9600   °[facility]  336- 378-7881   °[facility] [address], [place] 1-800-853-5163 or 336-641-4981   ° °Mobile Crisis Teams °Organization         Address  Phone  Notes  °[facility], Mobile Crisis Care Unit  1-877-626-1772   °Assertive °[facility] ° [address]  [place], [place] 336-834-9664   °[name] DeEsch [address] °[place] 336-554-5454   ° °Self-Help/Support Groups °Organization         Address  Phone             Notes  °[facility] [place] - variety of support groups  336- 373-1402 Call for more information  °Narcotics Anonymous (NA), [facility] [address], °[place]  2 meetings at this location  ° °  Residential Treatment Programs Organization         Address  Phone  Notes  [facility] [address]    [place]  1-(712)666-0954   [facility]  [address] [place] [address], [place], [place]   Basye Chauncey, [place] [phone] Admissions: 8am-3pm M-F  [facility] [address]    [place], [place] 590-931-1216   [facility] [address] [place], [place], [place]   [facility] [address],  [place], [place]   [facility] Intensive Outpatient [address], [place], [place]   [facility] [place]  [place], [place] 1-9203344069 or 601-705-6001   [facility] [address], [place], [place] Accepts Medicaid  [facility] [address],  [place] 1-469-065-3908 Substance Abuse/Addiction Treatment   [facility] Organization         Address  Phone  Notes  [facility]  (520)427-8104   Domenic Schwab, PhD [address] Arlis Porta [place], [place]   (401)337-9676 or 4186736380   [facility] [place], [place] (941)451-6334   [facility] [address], [place], [place] (870)271-2421 Insurance/Medicaid/sponsorship through [facility] and Families [address] [place]                                    [place], [place] [phone] [place] [address], [place] [phone]    Dr. Adele Schilder  978-263-2923   [facility] Dept. 1) [address] [place] 2) [place] 3)  [place] [address], [place] 260-018-9646 [phone]  [phone]   [place] 605-258-4025 or [phone] (After Hours)       Take your usual prescriptions as previously directed.  Apply moist heat or ice to the area(s) of discomfort, for 15 minutes at a time, several times per day for the next few days.  Do not fall asleep on a heating or ice pack.  Call your regular medical doctor tomorrow to schedule a follow up appointment within the next 1 to 2 days.  Return to the Emergency Department immediately if worsening.

## 2013-11-20 NOTE — ED Notes (Signed)
McManus, MD at bedside.  

## 2013-11-20 NOTE — Telephone Encounter (Signed)
Patient called and asked if OK to Prednisone RX from Dr Melvyn Novas due to the fact she has fractures.  Per Dr Melford Aase, he agrees with Dr Melvyn Novas.  Patient aware and will start RX.

## 2013-11-20 NOTE — Telephone Encounter (Signed)
Per OV 7/14/5: Patient Instructions      Augmentin 875 mg take one pill twice daily  X 10 days - take at breakfast and supper with large glass of water.  It would help reduce the usual side effects (diarrhea and yeast infections) if you ate cultured yogurt at lunch.  Prednisone 10 mg take  4 each am x 2 days,   2 each am x 2 days,  1 each am x 2 days and stop  hnique:  relax and gently blow all the way out then take a nice smooth deep breath back in, triggering the inhaler at same time you start breathing in.  Hold for up to 5 seconds if you can.  Rinse and gargle with water when done Return in 2 weeks if not completely better      --  Pt reports the augmentin is causing her to be very nausea. She did not eat any yogurt or take probiotic She is wanting to know if she can take 1/2 tablet bid and see hwo she does. Please advise MW thanks

## 2013-11-20 NOTE — ED Provider Notes (Signed)
CSN: [id number]     Arrival date & time 11/20/13  1611 History   First MD Initiated Contact with Patient 11/20/13 1740     Chief Complaint  Patient presents with  . Emesis  . Back Pain  . Shortness of Breath      HPI Pt was seen at 1750.  Per pt and her husband, c/o gradual onset and worsening of persistent cough and SOB for the past 1 to 2 weeks. Pt states she has had intermittent "wheezing." Pt was evaluated by her PMD and Pulmonologist this past week for these symptoms, rx augmentin and prednisone. States today she was "coughing so hard my back started hurting." Pt c/o right thoracic back "sharp" pain which worsens with palpation of the area, coughing, and body position changes. Pt also c/o acute flair of her chronic nausea after starting her antibiotics yesterday. Pt states she has an appt with her PMD tomorrow for her chronic back pain. Denies CP/palpitations, no flank pain, no abd pain, no vomiting/diarrhea, no fevers, no rash.     Past Medical History  Diagnosis Date  . CAD (coronary artery disease)     LHC 2003 with 40% pLAD, 30% mLAD, 100% D1 (moderate vessel), 100%  D2 (small vessel), 95% mid small OM2.  Pt had PTCA to D1;  D2 and OM2 small vessels and tx medically;  Last Myoview (2012): anterior infarct seen with scar, no ischemia. EF normal. Pt managed medically;  Lexiscan Myoview (12/14):  Low risk; ant scar with very mild peri-infarct ischemia; EF 55% with ant AK   . Recurrent aspiration bronchitis/pneumonia     PFTs 12/09 FVC 97%, FEV1 101%, ratio 77%, TLC 109%. No significant obstruction or restriction.   . Hypertension   . Hyperlipidemia   . Esophageal stricture   . Gastritis   . GERD (gastroesophageal reflux disease)   . Asthma   . Blood transfusion   . Cough   . Wheezing   . Weakness   . Trouble swallowing   . Abdominal pain   . Hiatal hernia   . Shortness of breath   . Anxiety   . Complication of anesthesia   . PONV (postoperative nausea and vomiting)   .  TIA (transient ischemic attack)   . BRONCHITIS, ACUTE 07/25/2007         . DIVERTICULOSIS, COLON 08/28/2008    Qualifier: Diagnosis of  By: [name], [name], [name]    . IRON DEFICIENCY ANEMIA, HX OF 07/24/2007    Qualifier: Diagnosis of  By: Tilden Dome    . Chronic nausea   . Chronic back pain    Past Surgical History  Procedure Laterality Date  . Back surgery    . Bladder repair    . Hemorrhoid surgery    . Cholecystectomy      Patient unsure of date.  [name] Appendectomy      patient unsure of date  . Total abdominal hysterectomy  1975    partial  . Coronary angioplasty  11/16/2001  . Eye surgery  11/2000    bilateral cataracts with lens implant  . Hiatal hernia repair  06/03/2011    Procedure: LAPAROSCOPIC REPAIR OF HIATAL HERNIA;  Surgeon: Adin Hector, MD;  Location: [facility];  Service: General;  Laterality: N/A;  . Laparoscopic nissen fundoplication  7/82/9562    Procedure: LAPAROSCOPIC NISSEN FUNDOPLICATION;  Surgeon: Adin Hector, MD;  Location: [facility];  Service: General;  Laterality: N/A;  . Lumbar laminectomy/decompression microdiscectomy Right 06/26/2012  Procedure: LUMBAR LAMINECTOMY/DECOMPRESSION MICRODISCECTOMY;  Surgeon: Jessy Oto, MD;  Location: [place];  Service: Orthopedics;  Laterality: Right;  RIGHT L4-5 MICRODISCECTOMY  . Esophagogastroduodenoscopy N/A 03/15/2013    Procedure: ESOPHAGOGASTRODUODENOSCOPY (EGD);  Surgeon: Jerene Bears, MD;  Location: [facility];  Service: Endoscopy;  Laterality: N/A;  . Esophageal manometry N/A 03/25/2013    Procedure: ESOPHAGEAL MANOMETRY (EM);  Surgeon: Inda Castle, MD;  Location: [facility];  Service: Endoscopy;  Laterality: N/A;  . Hiatal hernia repair N/A 04/24/2013    Procedure: LAPAROSCOPIC  REPAIR RECURRENT PARASOPHAGEAL HIATAL HERNIA WITH FUNDOPLICATION;  Surgeon: Adin Hector, MD;  Location: [facility];  Service: General;  Laterality: N/A;  . Insertion of mesh N/A 04/24/2013    Procedure: INSERTION OF MESH;   Surgeon: Adin Hector, MD;  Location: [facility];  Service: General;  Laterality: N/A;  . Laparoscopic lysis of adhesions N/A 04/24/2013    Procedure: LAPAROSCOPIC LYSIS OF ADHESIONS;  Surgeon: Adin Hector, MD;  Location: [facility];  Service: General;  Laterality: N/A;   Family History  Problem Relation Age of Onset  . Cancer Mother     colon   . Colon cancer Mother 31  . Heart disease Father 37    heart attack   History  Substance Use Topics  . Smoking status: Never Smoker   . Smokeless tobacco: Never Used  . Alcohol Use: No    Review of Systems ROS: Statement: All systems negative except as marked or noted in the HPI; Constitutional: Negative for fever and chills. ; ; Eyes: Negative for eye pain, redness and discharge. ; ; ENMT: Negative for ear pain, hoarseness, nasal congestion, sinus pressure and sore throat. ; ; Cardiovascular: Negative for chest pain, palpitations, diaphoresis, and peripheral edema. ; ; Respiratory: +SOB, cough. Negative for wheezing and stridor. ; ; Gastrointestinal: +nausea. Negative for vomiting, diarrhea, abdominal pain, blood in stool, hematemesis, jaundice and rectal bleeding. . ; ; Genitourinary: Negative for dysuria, flank pain and hematuria. ; ; Musculoskeletal: +right mid-back pain. Negative for neck pain. Negative for swelling and trauma.; ; Skin: Negative for pruritus, rash, abrasions, blisters, bruising and skin lesion.; ; Neuro: Negative for headache, lightheadedness and neck stiffness. Negative for weakness, altered level of consciousness , altered mental status, extremity weakness, paresthesias, involuntary movement, seizure and syncope.      Allergies  Aspartame; Daliresp; Hydrocodone; Levofloxacin; Oxycodone; Tramadol; and Ace inhibitors  Home Medications   Prior to Admission medications   Medication Sig Start Date End Date Taking? Authorizing Provider  Aclidinium Bromide (TUDORZA PRESSAIR) 400 MCG/ACT AEPB Inhale 1 puff into the lungs 2 (two)  times daily as needed (wheezing).    Yes Historical Provider, MD  ALPRAZolam Duanne Moron) 0.5 MG tablet Take 1 tablet (0.5 mg total) by mouth 3 (three) times daily as needed for sleep or anxiety. 09/20/13  Yes [name], MD  amLODipine (NORVASC) 10 MG tablet Take 10 mg by mouth every morning.    Yes Historical Provider, MD  amoxicillin-clavulanate (AUGMENTIN) 875-125 MG per tablet Take 1 tablet by mouth 2 (two) times daily. 11/19/13  Yes [name], MD  Cholecalciferol (VITAMIN D3) 2000 UNITS TABS Take 6,000 Units by mouth daily.    Yes Historical Provider, MD  gabapentin (NEURONTIN) 300 MG capsule Take 1 capsule (300 mg total) by mouth 3 (three) times daily. 07/08/13 01/08/15 Yes [name], MD  losartan (COZAAR) 100 MG tablet Take 100 mg by mouth daily.   Yes Historical Provider, MD  metoCLOPramide (REGLAN)  10 MG tablet Take 5-10 mg by mouth 4 (four) times daily as needed for nausea (and reflux).   Yes Historical Provider, MD  metoprolol tartrate (LOPRESSOR) 25 MG tablet Take 12.5 mg by mouth at bedtime.   Yes Historical Provider, MD  mometasone-formoterol (DULERA) 200-5 MCG/ACT AERO Inhale 2 puffs into the lungs 2 (two) times daily. 07/03/13  Yes Melissa R Smith, PA-C  ondansetron (ZOFRAN ODT) 8 MG disintegrating tablet Take 1 tablet (8 mg total) by mouth every 8 (eight) hours as needed for nausea or vomiting. For nausea 09/20/13 09/21/14 Yes [name], MD  OVER THE COUNTER MEDICATION Take 1,000 mg by mouth daily. Coral Calcium 1000mg    Yes Historical Provider, MD  OVER THE COUNTER MEDICATION Take 400 mg by mouth 3 (three) times daily. Magnesium Carbonate 400mg    Yes Historical Provider, MD  polyethylene glycol (MIRALAX / GLYCOLAX) packet Take 17 g by mouth 2 (two) times daily.   Yes Historical Provider, MD  predniSONE (DELTASONE) 10 MG tablet Take  4 each am x 2 days,   2 each am x 2 days,  1 each am x 2 days and stop 11/19/13  Yes [name], MD  traMADol (ULTRAM) 50 MG tablet Take by  mouth every 6 (six) hours as needed for moderate pain or severe pain.   Yes Historical Provider, MD  pravastatin (PRAVACHOL) 40 MG tablet Take 40 mg by mouth daily.    Historical Provider, MD   BP 128/61  Pulse 94  Resp 13  SpO2 99% Physical Exam 1755: Physical examination:  Nursing notes reviewed; Vital signs and O2 SAT reviewed;  Constitutional: Well developed, Well nourished, Well hydrated, In no acute distress; Head:  Normocephalic, atraumatic; Eyes: EOMI, PERRL, No scleral icterus; ENMT: Mouth and pharynx normal, Mucous membranes moist; Neck: Supple, Full range of motion, No lymphadenopathy; Cardiovascular: Regular rate and rhythm, No gallop; Respiratory: Breath sounds clear & equal bilaterally, No wheezes.  Speaking full sentences with ease, Normal respiratory effort/excursion; Chest: Nontender, Movement normal; Abdomen: Soft, Nontender, Nondistended, Normal bowel sounds; Genitourinary: No CVA tenderness; Spine:  No midline CS, TS, LS tenderness. +TTP right thoracic paraspinal muscles. No rash.;; Extremities: Pulses normal, No tenderness, No edema, No calf edema or asymmetry.; Neuro: AA&Ox3, Major CN grossly intact.  Speech clear. No gross focal motor or sensory deficits in extremities.; Skin: Color normal, Warm, Dry.   ED Course  Procedures     EKG Interpretation   Date/Time:  Wednesday November 20 2013 16:25:20 EDT Ventricular Rate:  85 PR Interval:  136 QRS Duration: 80 QT Interval:  380 QTC Calculation: 452 R Axis:   62 Text Interpretation:  Normal sinus rhythm Low voltage QRS Septal infarct ,  age undetermined Lateral infarct , age undetermined Abnormal ECG When  compared with ECG of 10/22/2013 No significant change was found Confirmed  by [facility]  MD, Nunzio Cory [phone]) on 11/20/2013 6:31:23 PM      MDM  MDM Reviewed: previous chart, nursing note and vitals Reviewed previous: labs and ECG Interpretation: labs, ECG and x-ray    Results for orders placed during the  hospital encounter of 11/20/13  CBC WITH DIFFERENTIAL      Result Value Ref Range   WBC 7.6  4.0 - 10.5 K/uL   RBC 5.02  3.87 - 5.11 MIL/uL   Hemoglobin 15.0  12.0 - 15.0 g/dL   HCT 44.2  36.0 - 46.0 %   MCV 88.0  78.0 - 100.0 fL   MCH 29.9  26.0 -  34.0 pg   MCHC 33.9  30.0 - 36.0 g/dL   RDW 15.7 (*) 11.5 - 15.5 %   Platelets 375  150 - 400 K/uL   Neutrophils Relative % 91 (*) 43 - 77 %   Neutro Abs 7.0  1.7 - 7.7 K/uL   Lymphocytes Relative 8 (*) 12 - 46 %   Lymphs Abs 0.6 (*) 0.7 - 4.0 K/uL   Monocytes Relative 1 (*) 3 - 12 %   Monocytes Absolute 0.1  0.1 - 1.0 K/uL   Eosinophils Relative 0  0 - 5 %   Eosinophils Absolute 0.0  0.0 - 0.7 K/uL   Basophils Relative 0  0 - 1 %   Basophils Absolute 0.0  0.0 - 0.1 K/uL  COMPREHENSIVE METABOLIC PANEL      Result Value Ref Range   Sodium 139  137 - 147 mEq/L   Potassium 4.2  3.7 - 5.3 mEq/L   Chloride 99  96 - 112 mEq/L   CO2 23  19 - 32 mEq/L   Glucose, Bld 133 (*) 70 - 99 mg/dL   BUN 9  6 - 23 mg/dL   Creatinine, Ser 0.61  0.50 - 1.10 mg/dL   Calcium 9.7  8.4 - 10.5 mg/dL   Total Protein 8.0  6.0 - 8.3 g/dL   Albumin 4.4  3.5 - 5.2 g/dL   AST 24  0 - 37 U/L   ALT 30  0 - 35 U/L   Alkaline Phosphatase 121 (*) 39 - 117 U/L   Total Bilirubin 0.5  0.3 - 1.2 mg/dL   GFR calc non Af Amer 86 (*) >90 mL/min   GFR calc Af Amer >90  >90 mL/min   Anion gap 17 (*) 5 - 15  LIPASE, BLOOD      Result Value Ref Range   Lipase 8 (*) 11 - 59 U/L  TROPONIN I      Result Value Ref Range   Troponin I <0.30  <0.30 ng/mL  D-DIMER, QUANTITATIVE      Result Value Ref Range   D-Dimer, Quant 1.43 (*) 0.00 - 0.48 ug/mL-FEU   Dg Chest 2 View 11/20/2013   CLINICAL DATA:  Shortness of breath.  EXAM: CHEST  2 VIEW  COMPARISON:  10/22/2013.  FINDINGS: Bilateral hyperexpansion suggests emphysema. No focal airspace consolidation or pulmonary edema. No pleural effusion. Scarring at the left costophrenic angle is stable. The cardiopericardial silhouette is  within normal limits for size. Bones are diffusely demineralized compression deformity at T12 and L1 is stable.  IMPRESSION: Emphysema without acute cardiopulmonary findings.   Electronically Signed   By: Misty Stanley M.D.   On: 11/20/2013 18:37    Ct Angio Chest Pe W/cm &/or Wo Cm 11/20/2013   CLINICAL DATA:  Pain and shortness of Breath  EXAM: CT ANGIOGRAPHY CHEST WITH CONTRAST  TECHNIQUE: Multidetector CT imaging of the chest was performed using the standard protocol during bolus administration of intravenous contrast. Multiplanar CT image reconstructions and MIPs were obtained to evaluate the vascular anatomy.  CONTRAST:  18mL OMNIPAQUE IOHEXOL 350 MG/ML SOLN  COMPARISON:  Chest radiograph November 20, 2013 and chest CT May 13, 2013  FINDINGS: There is no demonstrable pulmonary embolus. There is atherosclerotic change in the aorta but no demonstrable thoracic aortic aneurysm or dissection.  There is mild bibasilar lung scarring. There is no lung edema or consolidation.  There is extensive coronary artery calcification. Pericardium is not thickened. There is no appreciable thoracic adenopathy.  There is a small hiatal hernia. The previously noted periesophageal fluid distally has resolved.  There is postoperative change at the gastroesophageal junction. There is fatty liver. Gallbladder is absent. The common hepatic duct is incompletely visualized but does appear dilated. There is degenerative change in the lower cervical and thoracic spine regions. No blastic or lytic bone lesions are identified. Thyroid appears unremarkable.  Review of the MIP images confirms the above findings.  IMPRESSION: No demonstrable pulmonary embolus.  No lung edema or consolidation.  Extensive coronary artery calcification present.  Postoperative change at the gastroesophageal junction. There is a small hiatal hernia apparent.  There is fatty liver. Visualized extrahepatic biliary ductal system appears enlarged. This area is  incompletely visualized.   Electronically Signed   By: Lowella Grip M.D.   On: 11/20/2013 21:45    2240:  Pt feels better after meds and wants to go home now. Pt has multiple medications at home for pain, nausea and anxiety. Pt also is already taking prednisone and an antibiotic for her cough. Will continue to tx symptomatically at this time. Pt strongly encouraged to keep her appt tomorrow with her PMD; verb understanding. Dx and testing d/w pt and family.  Questions answered.  Verb understanding, agreeable to d/c home with outpt f/u.      Alfonzo Feller, DO 11/22/13 1544

## 2013-11-20 NOTE — Telephone Encounter (Signed)
PT informed of Dr Gustavus Bryant recommendations.  Pt verbalized understanding.

## 2013-11-21 ENCOUNTER — Ambulatory Visit (HOSPITAL_COMMUNITY)
Admission: RE | Admit: 2013-11-21 | Discharge: 2013-11-21 | Disposition: A | Discharge status: Home / Self Care | Payer: Medicare HMO | Source: Ambulatory Visit | Attending: Interventional Radiology | Admitting: Interventional Radiology

## 2013-11-21 ENCOUNTER — Other Ambulatory Visit (HOSPITAL_COMMUNITY): Payer: Self-pay | Admitting: Interventional Radiology

## 2013-11-21 DIAGNOSIS — M549 Dorsalgia, unspecified: Secondary | ICD-10-CM

## 2013-11-21 DIAGNOSIS — IMO0002 Reserved for concepts with insufficient information to code with codable children: Secondary | ICD-10-CM

## 2013-11-21 DIAGNOSIS — M8448XA Pathological fracture, other site, initial encounter for fracture: Secondary | ICD-10-CM | POA: Insufficient documentation

## 2013-11-21 DIAGNOSIS — M51379 Other intervertebral disc degeneration, lumbosacral region without mention of lumbar back pain or lower extremity pain: Secondary | ICD-10-CM | POA: Insufficient documentation

## 2013-11-21 DIAGNOSIS — M545 Low back pain, unspecified: Secondary | ICD-10-CM | POA: Diagnosis present

## 2013-11-21 DIAGNOSIS — M5137 Other intervertebral disc degeneration, lumbosacral region: Secondary | ICD-10-CM | POA: Insufficient documentation

## 2013-11-21 MED ORDER — OXYCODONE-ACETAMINOPHEN 5-325 MG PO TABS
ORAL_TABLET | ORAL | Status: AC
  Administered 2013-11-21: 1 via ORAL
  Filled 2013-11-21: qty 1

## 2013-11-21 MED ORDER — OXYCODONE-ACETAMINOPHEN 5-325 MG PO TABS
1.0000 | ORAL_TABLET | Freq: Once | ORAL | Status: AC
  Administered 2013-11-21: 1 via ORAL

## 2013-11-21 MED ORDER — OXYCODONE-ACETAMINOPHEN 5-325 MG PO TABS
1.0000 | ORAL_TABLET | Freq: Once | ORAL | Status: AC
Start: 2013-11-21 — End: 2013-11-21
  Administered 2013-11-21: 1 via ORAL

## 2013-11-21 MED ORDER — OXYCODONE-ACETAMINOPHEN 5-325 MG PO TABS
ORAL_TABLET | ORAL | Status: DC
Start: 2013-11-21 — End: 2013-11-22
  Filled 2013-11-21: qty 1

## 2013-11-26 ENCOUNTER — Telehealth (HOSPITAL_COMMUNITY): Payer: Self-pay | Admitting: Interventional Radiology

## 2013-11-26 NOTE — Telephone Encounter (Signed)
Called pt, she states her back pain is not any better. She did have some relief taking the Percocet but without that the pain is unchanged. I told her I would relay this info to D and call her back. She is in agreement with this. JM

## 2013-11-27 ENCOUNTER — Other Ambulatory Visit: Payer: Self-pay | Admitting: Physician Assistant

## 2013-11-29 ENCOUNTER — Telehealth (HOSPITAL_COMMUNITY): Payer: Self-pay | Admitting: Interventional Radiology

## 2013-11-29 ENCOUNTER — Ambulatory Visit (INDEPENDENT_AMBULATORY_CARE_PROVIDER_SITE_OTHER): Payer: Medicare HMO | Admitting: *Deleted

## 2013-11-29 ENCOUNTER — Other Ambulatory Visit: Payer: Self-pay | Admitting: Internal Medicine

## 2013-11-29 DIAGNOSIS — R6889 Other general symptoms and signs: Secondary | ICD-10-CM

## 2013-11-29 LAB — HEPATIC FUNCTION PANEL
ALBUMIN: 4.1 g/dL (ref 3.5–5.2)
ALT: 116 U/L — AB (ref 0–35)
AST: 74 U/L — ABNORMAL HIGH (ref 0–37)
Alkaline Phosphatase: 140 U/L — ABNORMAL HIGH (ref 39–117)
BILIRUBIN INDIRECT: 0.4 mg/dL (ref 0.2–1.2)
Bilirubin, Direct: 0.2 mg/dL (ref 0.0–0.3)
TOTAL PROTEIN: 6.8 g/dL (ref 6.0–8.3)
Total Bilirubin: 0.6 mg/dL (ref 0.2–1.2)

## 2013-11-29 NOTE — Telephone Encounter (Signed)
Called pt, left VM JM

## 2013-12-03 ENCOUNTER — Inpatient Hospital Stay (HOSPITAL_COMMUNITY): Payer: Medicare HMO

## 2013-12-03 ENCOUNTER — Encounter (HOSPITAL_COMMUNITY): Payer: Self-pay | Admitting: Emergency Medicine

## 2013-12-03 ENCOUNTER — Emergency Department (HOSPITAL_COMMUNITY): Payer: Medicare HMO

## 2013-12-03 ENCOUNTER — Inpatient Hospital Stay (HOSPITAL_COMMUNITY)
Admission: EM | Admit: 2013-12-03 | Discharge: 2013-12-05 | DRG: 441 | Disposition: A | Discharge status: Home / Self Care | Payer: Medicare HMO | Attending: Internal Medicine | Admitting: Internal Medicine

## 2013-12-03 DIAGNOSIS — Z8673 Personal history of transient ischemic attack (TIA), and cerebral infarction without residual deficits: Secondary | ICD-10-CM

## 2013-12-03 DIAGNOSIS — K219 Gastro-esophageal reflux disease without esophagitis: Secondary | ICD-10-CM | POA: Diagnosis present

## 2013-12-03 DIAGNOSIS — K5900 Constipation, unspecified: Secondary | ICD-10-CM | POA: Diagnosis present

## 2013-12-03 DIAGNOSIS — Z981 Arthrodesis status: Secondary | ICD-10-CM

## 2013-12-03 DIAGNOSIS — R192 Visible peristalsis: Secondary | ICD-10-CM

## 2013-12-03 DIAGNOSIS — R7401 Elevation of levels of liver transaminase levels: Secondary | ICD-10-CM | POA: Diagnosis present

## 2013-12-03 DIAGNOSIS — K7200 Acute and subacute hepatic failure without coma: Principal | ICD-10-CM | POA: Diagnosis present

## 2013-12-03 DIAGNOSIS — R748 Abnormal levels of other serum enzymes: Secondary | ICD-10-CM

## 2013-12-03 DIAGNOSIS — E43 Unspecified severe protein-calorie malnutrition: Secondary | ICD-10-CM | POA: Diagnosis present

## 2013-12-03 DIAGNOSIS — T360X5A Adverse effect of penicillins, initial encounter: Secondary | ICD-10-CM | POA: Diagnosis present

## 2013-12-03 DIAGNOSIS — F411 Generalized anxiety disorder: Secondary | ICD-10-CM | POA: Diagnosis present

## 2013-12-03 DIAGNOSIS — I2510 Atherosclerotic heart disease of native coronary artery without angina pectoris: Secondary | ICD-10-CM | POA: Diagnosis present

## 2013-12-03 DIAGNOSIS — Z9861 Coronary angioplasty status: Secondary | ICD-10-CM | POA: Diagnosis not present

## 2013-12-03 DIAGNOSIS — I10 Essential (primary) hypertension: Secondary | ICD-10-CM

## 2013-12-03 DIAGNOSIS — J45909 Unspecified asthma, uncomplicated: Secondary | ICD-10-CM | POA: Diagnosis present

## 2013-12-03 DIAGNOSIS — K7689 Other specified diseases of liver: Secondary | ICD-10-CM | POA: Diagnosis present

## 2013-12-03 DIAGNOSIS — R110 Nausea: Secondary | ICD-10-CM

## 2013-12-03 DIAGNOSIS — E785 Hyperlipidemia, unspecified: Secondary | ICD-10-CM | POA: Diagnosis present

## 2013-12-03 DIAGNOSIS — R1032 Left lower quadrant pain: Secondary | ICD-10-CM

## 2013-12-03 DIAGNOSIS — E782 Mixed hyperlipidemia: Secondary | ICD-10-CM

## 2013-12-03 DIAGNOSIS — R7402 Elevation of levels of lactic acid dehydrogenase (LDH): Secondary | ICD-10-CM | POA: Diagnosis present

## 2013-12-03 DIAGNOSIS — M549 Dorsalgia, unspecified: Secondary | ICD-10-CM | POA: Diagnosis present

## 2013-12-03 DIAGNOSIS — R740 Nonspecific elevation of levels of transaminase and lactic acid dehydrogenase [LDH]: Secondary | ICD-10-CM

## 2013-12-03 LAB — CBC WITH DIFFERENTIAL/PLATELET
BASOS ABS: 0 10*3/uL (ref 0.0–0.1)
Basophils Relative: 1 % (ref 0–1)
Eosinophils Absolute: 0.2 10*3/uL (ref 0.0–0.7)
Eosinophils Relative: 3 % (ref 0–5)
HCT: 44.4 % (ref 36.0–46.0)
HEMOGLOBIN: 15.5 g/dL — AB (ref 12.0–15.0)
LYMPHS ABS: 1.1 10*3/uL (ref 0.7–4.0)
Lymphocytes Relative: 20 % (ref 12–46)
MCH: 31.2 pg (ref 26.0–34.0)
MCHC: 34.9 g/dL (ref 30.0–36.0)
MCV: 89.3 fL (ref 78.0–100.0)
MONO ABS: 0.5 10*3/uL (ref 0.1–1.0)
MONOS PCT: 9 % (ref 3–12)
NEUTROS ABS: 4 10*3/uL (ref 1.7–7.7)
Neutrophils Relative %: 67 % (ref 43–77)
PLATELETS: 343 10*3/uL (ref 150–400)
RBC: 4.97 MIL/uL (ref 3.87–5.11)
RDW: 15.4 % (ref 11.5–15.5)
WBC: 5.8 10*3/uL (ref 4.0–10.5)

## 2013-12-03 LAB — RETICULOCYTES
RBC.: 4.88 MIL/uL (ref 3.87–5.11)
Retic Count, Absolute: 39 10*3/uL (ref 19.0–186.0)
Retic Ct Pct: 0.8 % (ref 0.4–3.1)

## 2013-12-03 LAB — CBC
HCT: 44.2 % (ref 36.0–46.0)
HEMOGLOBIN: 14.4 g/dL (ref 12.0–15.0)
MCH: 29.3 pg (ref 26.0–34.0)
MCHC: 32.6 g/dL (ref 30.0–36.0)
MCV: 89.8 fL (ref 78.0–100.0)
Platelets: 372 10*3/uL (ref 150–400)
RBC: 4.92 MIL/uL (ref 3.87–5.11)
RDW: 15.4 % (ref 11.5–15.5)
WBC: 7.5 10*3/uL (ref 4.0–10.5)

## 2013-12-03 LAB — SALICYLATE LEVEL

## 2013-12-03 LAB — COMPREHENSIVE METABOLIC PANEL
ALK PHOS: 310 U/L — AB (ref 39–117)
ALT: 593 U/L — AB (ref 0–35)
AST: 402 U/L — AB (ref 0–37)
Albumin: 4.1 g/dL (ref 3.5–5.2)
Anion gap: 15 (ref 5–15)
BUN: 12 mg/dL (ref 6–23)
CALCIUM: 9.9 mg/dL (ref 8.4–10.5)
CHLORIDE: 101 meq/L (ref 96–112)
CO2: 26 meq/L (ref 19–32)
Creatinine, Ser: 0.78 mg/dL (ref 0.50–1.10)
GFR, EST NON AFRICAN AMERICAN: 79 mL/min — AB (ref >90)
GLUCOSE: 111 mg/dL — AB (ref 70–99)
POTASSIUM: 4.6 meq/L (ref 3.7–5.3)
Sodium: 142 mEq/L (ref 137–147)
Total Bilirubin: 0.7 mg/dL (ref 0.3–1.2)
Total Protein: 7.8 g/dL (ref 6.0–8.3)

## 2013-12-03 LAB — URINALYSIS, ROUTINE W REFLEX MICROSCOPIC
BILIRUBIN URINE: NEGATIVE
GLUCOSE, UA: NEGATIVE mg/dL
HGB URINE DIPSTICK: NEGATIVE
Ketones, ur: 15 mg/dL — AB
Leukocytes, UA: NEGATIVE
Nitrite: NEGATIVE
Protein, ur: NEGATIVE mg/dL
Specific Gravity, Urine: 1.013 (ref 1.005–1.030)
Urobilinogen, UA: 0.2 mg/dL (ref 0.0–1.0)
pH: 7 (ref 5.0–8.0)

## 2013-12-03 LAB — LIPASE, BLOOD: Lipase: 9 U/L — ABNORMAL LOW (ref 11–59)

## 2013-12-03 LAB — RAPID URINE DRUG SCREEN, HOSP PERFORMED
Amphetamines: NOT DETECTED
BARBITURATES: NOT DETECTED
Benzodiazepines: NOT DETECTED
COCAINE: NOT DETECTED
OPIATES: POSITIVE — AB
TETRAHYDROCANNABINOL: NOT DETECTED

## 2013-12-03 LAB — TROPONIN I
Troponin I: <0.3 ng/mL (ref <0.30)
Troponin I: <0.3 ng/mL (ref <0.30)

## 2013-12-03 LAB — HEPATITIS PANEL, ACUTE
HCV Ab: NEGATIVE
HEP B C IGM: NONREACTIVE
Hep A IgM: NONREACTIVE
Hepatitis B Surface Ag: NEGATIVE

## 2013-12-03 LAB — VITAMIN B12: VITAMIN B 12: 1658 pg/mL — AB (ref 211–911)

## 2013-12-03 LAB — IRON AND TIBC
Iron: 118 ug/dL (ref 42–135)
Saturation Ratios: 31 % (ref 20–55)
TIBC: 382 ug/dL (ref 250–470)
UIBC: 264 ug/dL (ref 125–400)

## 2013-12-03 LAB — CREATININE, SERUM
CREATININE: 0.79 mg/dL (ref 0.50–1.10)
GFR calc non Af Amer: 79 mL/min — ABNORMAL LOW (ref >90)

## 2013-12-03 LAB — MONONUCLEOSIS SCREEN: Mono Screen: NEGATIVE

## 2013-12-03 LAB — PROTIME-INR
INR: 0.97 (ref 0.00–1.49)
PROTHROMBIN TIME: 12.9 s (ref 11.6–15.2)

## 2013-12-03 LAB — FERRITIN: Ferritin: 33 ng/mL (ref 10–291)

## 2013-12-03 LAB — FOLATE

## 2013-12-03 LAB — ACETAMINOPHEN LEVEL: Acetaminophen (Tylenol), Serum: <15 ug/mL (ref 10–30)

## 2013-12-03 LAB — CK: Total CK: 36 U/L (ref 7–177)

## 2013-12-03 MED ORDER — GABAPENTIN 300 MG PO CAPS
300.0000 mg | ORAL_CAPSULE | Freq: Three times a day (TID) | ORAL | Status: DC
  Administered 2013-12-03 – 2013-12-05 (×6): 300 mg via ORAL
  Filled 2013-12-03 (×7): qty 1

## 2013-12-03 MED ORDER — IOHEXOL 300 MG/ML  SOLN
80.0000 mL | Freq: Once | INTRAMUSCULAR | Status: AC | PRN
  Administered 2013-12-03: 80 mL via INTRAVENOUS

## 2013-12-03 MED ORDER — TRAMADOL HCL 50 MG PO TABS
50.0000 mg | ORAL_TABLET | Freq: Four times a day (QID) | ORAL | Status: DC | PRN
  Administered 2013-12-03 – 2013-12-04 (×2): 50 mg via ORAL
  Filled 2013-12-03 (×2): qty 1

## 2013-12-03 MED ORDER — ENOXAPARIN SODIUM 40 MG/0.4ML ~~LOC~~ SOLN
40.0000 mg | SUBCUTANEOUS | Status: DC
  Administered 2013-12-03 – 2013-12-04 (×2): 40 mg via SUBCUTANEOUS
  Filled 2013-12-03 (×3): qty 0.4

## 2013-12-03 MED ORDER — LOSARTAN POTASSIUM 50 MG PO TABS
100.0000 mg | ORAL_TABLET | Freq: Every day | ORAL | Status: DC
  Administered 2013-12-03 – 2013-12-05 (×3): 100 mg via ORAL
  Filled 2013-12-03 (×3): qty 2

## 2013-12-03 MED ORDER — METOPROLOL TARTRATE 12.5 MG HALF TABLET
12.5000 mg | ORAL_TABLET | Freq: Every day | ORAL | Status: DC
  Administered 2013-12-03 – 2013-12-04 (×2): 12.5 mg via ORAL
  Filled 2013-12-03 (×4): qty 1

## 2013-12-03 MED ORDER — MOMETASONE FURO-FORMOTEROL FUM 200-5 MCG/ACT IN AERO
2.0000 | INHALATION_SPRAY | Freq: Two times a day (BID) | RESPIRATORY_TRACT | Status: DC
  Administered 2013-12-04 – 2013-12-05 (×3): 2 via RESPIRATORY_TRACT
  Filled 2013-12-03: qty 8.8

## 2013-12-03 MED ORDER — SODIUM CHLORIDE 0.9 % IV SOLN
Freq: Once | INTRAVENOUS | Status: AC
  Administered 2013-12-03: 12:00:00 via INTRAVENOUS

## 2013-12-03 MED ORDER — ONDANSETRON HCL 4 MG PO TABS
4.0000 mg | ORAL_TABLET | Freq: Four times a day (QID) | ORAL | Status: DC | PRN
  Administered 2013-12-04: 4 mg via ORAL
  Filled 2013-12-03: qty 1

## 2013-12-03 MED ORDER — AMLODIPINE BESYLATE 10 MG PO TABS
10.0000 mg | ORAL_TABLET | Freq: Every day | ORAL | Status: DC
  Administered 2013-12-03 – 2013-12-05 (×3): 10 mg via ORAL
  Filled 2013-12-03 (×3): qty 1

## 2013-12-03 MED ORDER — SODIUM CHLORIDE 0.9 % IJ SOLN
3.0000 mL | Freq: Two times a day (BID) | INTRAMUSCULAR | Status: DC
  Administered 2013-12-04: 3 mL via INTRAVENOUS

## 2013-12-03 MED ORDER — ONDANSETRON HCL 4 MG/2ML IJ SOLN
4.0000 mg | Freq: Once | INTRAMUSCULAR | Status: AC
  Administered 2013-12-03: 4 mg via INTRAVENOUS
  Filled 2013-12-03: qty 2

## 2013-12-03 MED ORDER — ALPRAZOLAM 0.5 MG PO TABS
0.5000 mg | ORAL_TABLET | Freq: Three times a day (TID) | ORAL | Status: DC | PRN
  Administered 2013-12-03 – 2013-12-04 (×2): 0.5 mg via ORAL
  Filled 2013-12-03 (×2): qty 1

## 2013-12-03 MED ORDER — HYDRALAZINE HCL 20 MG/ML IJ SOLN
5.0000 mg | INTRAMUSCULAR | Status: DC | PRN
Start: 2013-12-03 — End: 2013-12-05
  Administered 2013-12-03: 5 mg via INTRAVENOUS
  Filled 2013-12-03: qty 1

## 2013-12-03 MED ORDER — HYDROMORPHONE HCL PF 1 MG/ML IJ SOLN
0.5000 mg | Freq: Once | INTRAMUSCULAR | Status: AC
  Administered 2013-12-03: 0.5 mg via INTRAVENOUS
  Filled 2013-12-03: qty 1

## 2013-12-03 MED ORDER — MORPHINE SULFATE 4 MG/ML IJ SOLN
4.0000 mg | Freq: Once | INTRAMUSCULAR | Status: AC
  Administered 2013-12-03: 4 mg via INTRAVENOUS
  Filled 2013-12-03: qty 1

## 2013-12-03 MED ORDER — LEVALBUTEROL HCL 0.63 MG/3ML IN NEBU: 0.6300 mg | INHALATION_SOLUTION | Freq: Four times a day (QID) | RESPIRATORY_TRACT | Status: DC | PRN

## 2013-12-03 MED ORDER — GUAIFENESIN-DM 100-10 MG/5ML PO SYRP
5.0000 mL | ORAL_SOLUTION | ORAL | Status: DC | PRN
  Filled 2013-12-03: qty 5

## 2013-12-03 MED ORDER — ONDANSETRON HCL 4 MG/2ML IJ SOLN
4.0000 mg | Freq: Four times a day (QID) | INTRAMUSCULAR | Status: DC | PRN
  Administered 2013-12-03: 4 mg via INTRAVENOUS
  Filled 2013-12-03: qty 2

## 2013-12-03 MED ORDER — METOCLOPRAMIDE HCL 5 MG/ML IJ SOLN
5.0000 mg | Freq: Once | INTRAMUSCULAR | Status: AC
  Administered 2013-12-03: 5 mg via INTRAVENOUS
  Filled 2013-12-03: qty 2

## 2013-12-03 MED ORDER — HYDROMORPHONE HCL PF 1 MG/ML IJ SOLN
0.5000 mg | INTRAMUSCULAR | Status: DC | PRN
  Administered 2013-12-03 – 2013-12-04 (×4): 0.5 mg via INTRAVENOUS
  Filled 2013-12-03 (×4): qty 1

## 2013-12-03 MED ORDER — SODIUM CHLORIDE 0.9 % IV SOLN
INTRAVENOUS | Status: DC
  Administered 2013-12-03 – 2013-12-04 (×3): via INTRAVENOUS

## 2013-12-03 MED ORDER — IOHEXOL 300 MG/ML  SOLN
25.0000 mL | Freq: Once | INTRAMUSCULAR | Status: AC | PRN
  Administered 2013-12-03: 25 mL via ORAL

## 2013-12-03 NOTE — Progress Notes (Signed)
[name] [name] [id number] Code Status: Full   Admission Data: 12/03/2013 6:02 PM Attending Provider:  Allyson Sabal MAU:[id number],GYBWLSL DAVID, MD Consults/ Treatment Team:    [name] [name] is a 78 y.o. female patient admitted from ED awake, alert - oriented  X 3 - no acute distress noted.  VSS - Blood pressure 147/95, pulse 83, temperature 98.6 F (37 C), temperature source Oral, resp. rate 19, SpO2 98.00%.    IV in place, occlusive dsg intact without redness.  Orientation to room, and floor completed with information packet given to patient/family.  Admission INP armband ID verified with patient/family, and in place.   SR up x 2, fall assessment complete, with patient and family able to verbalize understanding of risk associated with falls, and verbalized understanding to call nsg before up out of bed.  Call light within reach, patient able to voice, and demonstrate understanding.  Skin, clean-dry- intact without evidence of bruising, or skin tears.   No evidence of skin break down noted on exam.     Will cont to eval and treat per MD orders.  [name] [name], [place] 12/03/2013 6:02 PM

## 2013-12-03 NOTE — ED Notes (Signed)
Patient transported to CT 

## 2013-12-03 NOTE — Progress Notes (Signed)
MD paged and notified of pt arrival on unit. Currently awaiting orders.

## 2013-12-03 NOTE — ED Notes (Signed)
CT notified pt finished drinking contrast  

## 2013-12-03 NOTE — ED Notes (Addendum)
She states shes had nausea and back pain worse since yesterday. She denies bowel/bladder changes. She has chronic back pain and also she states 'ive been nauseated since i had my hernia surgery." she took zofran at home this morning with no relief

## 2013-12-03 NOTE — ED Notes (Signed)
Patient transported to X-ray 

## 2013-12-03 NOTE — ED Provider Notes (Signed)
CSN: [id number]     Arrival date & time 12/03/13  1008 History   First MD Initiated Contact with Patient 12/03/13 1107     Chief Complaint  Patient presents with  . Nausea     (Consider location/radiation/quality/duration/timing/severity/associated sxs/prior Treatment) HPI Comments: The patient was sent to the ER for evaluation of nausea and back pain. Patient reports a history of chronic back pain secondary to degenerative disc disease and previous compression fractures treated by kyphoplasty. Patient reports increased pain onset 4 days ago. She has had concomitant nausea and decreased by mouth intake, but no vomiting. No diarrhea. She has had diffuse abdominal cramping pain which is mild to moderate. Patient reports that she has had intermittent episodes of abdominal pain and nausea similar to this in the past secondary to hiatal hernia surgery.   Past Medical History  Diagnosis Date  . CAD (coronary artery disease)     LHC 2003 with 40% pLAD, 30% mLAD, 100% D1 (moderate vessel), 100%  D2 (small vessel), 95% mid small OM2.  Pt had PTCA to D1;  D2 and OM2 small vessels and tx medically;  Last Myoview (2012): anterior infarct seen with scar, no ischemia. EF normal. Pt managed medically;  Lexiscan Myoview (12/14):  Low risk; ant scar with very mild peri-infarct ischemia; EF 55% with ant AK   . Recurrent aspiration bronchitis/pneumonia     PFTs 12/09 FVC 97%, FEV1 101%, ratio 77%, TLC 109%. No significant obstruction or restriction.   . Hypertension   . Hyperlipidemia   . Esophageal stricture   . Gastritis   . GERD (gastroesophageal reflux disease)   . Asthma   . Blood transfusion   . Cough   . Wheezing   . Weakness   . Trouble swallowing   . Abdominal pain   . Hiatal hernia   . Shortness of breath   . Anxiety   . Complication of anesthesia   . PONV (postoperative nausea and vomiting)   . TIA (transient ischemic attack)   . BRONCHITIS, ACUTE 07/25/2007         . DIVERTICULOSIS,  COLON 08/28/2008    Qualifier: Diagnosis of  By: [name], [name], [name]    . IRON DEFICIENCY ANEMIA, HX OF 07/24/2007    Qualifier: Diagnosis of  By: Tilden Dome    . Chronic nausea   . Chronic back pain    Past Surgical History  Procedure Laterality Date  . Back surgery    . Bladder repair    . Hemorrhoid surgery    . Cholecystectomy      Patient unsure of date.  [name] Appendectomy      patient unsure of date  . Total abdominal hysterectomy  1975    partial  . Coronary angioplasty  11/16/2001  . Eye surgery  11/2000    bilateral cataracts with lens implant  . Hiatal hernia repair  06/03/2011    Procedure: LAPAROSCOPIC REPAIR OF HIATAL HERNIA;  Surgeon: Adin Hector, MD;  Location: [facility];  Service: General;  Laterality: N/A;  . Laparoscopic nissen fundoplication  1/44/3154    Procedure: LAPAROSCOPIC NISSEN FUNDOPLICATION;  Surgeon: Adin Hector, MD;  Location: [facility];  Service: General;  Laterality: N/A;  . Lumbar laminectomy/decompression microdiscectomy Right 06/26/2012    Procedure: LUMBAR LAMINECTOMY/DECOMPRESSION MICRODISCECTOMY;  Surgeon: Jessy Oto, MD;  Location: [place];  Service: Orthopedics;  Laterality: Right;  RIGHT L4-5 MICRODISCECTOMY  . Esophagogastroduodenoscopy N/A 03/15/2013    Procedure: ESOPHAGOGASTRODUODENOSCOPY (EGD);  Surgeon: Lajuan Lines  Pyrtle, MD;  Location: [place];  Service: Endoscopy;  Laterality: N/A;  . Esophageal manometry N/A 03/25/2013    Procedure: ESOPHAGEAL MANOMETRY (EM);  Surgeon: Inda Castle, MD;  Location: [facility];  Service: Endoscopy;  Laterality: N/A;  . Hiatal hernia repair N/A 04/24/2013    Procedure: LAPAROSCOPIC  REPAIR RECURRENT PARASOPHAGEAL HIATAL HERNIA WITH FUNDOPLICATION;  Surgeon: Adin Hector, MD;  Location: [facility];  Service: General;  Laterality: N/A;  . Insertion of mesh N/A 04/24/2013    Procedure: INSERTION OF MESH;  Surgeon: Adin Hector, MD;  Location: [facility];  Service: General;  Laterality: N/A;  .  Laparoscopic lysis of adhesions N/A 04/24/2013    Procedure: LAPAROSCOPIC LYSIS OF ADHESIONS;  Surgeon: Adin Hector, MD;  Location: [facility];  Service: General;  Laterality: N/A;   Family History  Problem Relation Age of Onset  . Cancer Mother     colon   . Colon cancer Mother 76  . Heart disease Father 31    heart attack   History  Substance Use Topics  . Smoking status: Never Smoker   . Smokeless tobacco: Never Used  . Alcohol Use: No   OB History   Grav Para Term Preterm Abortions TAB SAB Ect Mult Living                 Review of Systems  Constitutional: Negative for fever.  Respiratory: Negative for shortness of breath.   Cardiovascular: Negative for chest pain.  Gastrointestinal: Positive for nausea and abdominal pain.  Musculoskeletal: Positive for back pain.  All other systems reviewed and are negative.     Allergies  Aspartame; Daliresp; Hydrocodone; Levofloxacin; Tramadol; and Ace inhibitors  Home Medications   Prior to Admission medications   Medication Sig Start Date End Date Taking? Authorizing Provider  Aclidinium Bromide (TUDORZA PRESSAIR) 400 MCG/ACT AEPB Inhale 1 puff into the lungs 2 (two) times daily as needed (wheezing).    Yes Historical Provider, MD  ALPRAZolam Duanne Moron) 0.5 MG tablet Take 1 tablet (0.5 mg total) by mouth 3 (three) times daily as needed for sleep or anxiety. 09/20/13  Yes [name], MD  amLODipine (NORVASC) 10 MG tablet Take 10 mg by mouth daily. For blood pressure   Yes Historical Provider, MD  Cholecalciferol (VITAMIN D3) 2000 UNITS TABS Take 6,000 Units by mouth daily.    Yes Historical Provider, MD  gabapentin (NEURONTIN) 300 MG capsule Take 1 capsule (300 mg total) by mouth 3 (three) times daily. 07/08/13 01/08/15 Yes [name], MD  losartan (COZAAR) 100 MG tablet Take 100 mg by mouth daily.   Yes Historical Provider, MD  metoCLOPramide (REGLAN) 10 MG tablet Take 5-10 mg by mouth 4 (four) times daily as needed for nausea  (and reflux).   Yes Historical Provider, MD  metoprolol tartrate (LOPRESSOR) 25 MG tablet Take 12.5 mg by mouth at bedtime.   Yes Historical Provider, MD  mometasone-formoterol (DULERA) 200-5 MCG/ACT AERO Inhale 2 puffs into the lungs 2 (two) times daily. 07/03/13  Yes Melissa R Smith, PA-C  ondansetron (ZOFRAN ODT) 8 MG disintegrating tablet Take 1 tablet (8 mg total) by mouth every 8 (eight) hours as needed for nausea or vomiting. For nausea 09/20/13 09/21/14 Yes [name], MD  OVER THE COUNTER MEDICATION Take 1,000 mg by mouth daily. Coral Calcium 1000mg    Yes Historical Provider, MD  OVER THE COUNTER MEDICATION Take 400 mg by mouth 3 (three) times daily. Magnesium Carbonate 400mg    Yes Historical Provider,  MD  polyethylene glycol (MIRALAX / GLYCOLAX) packet Take 17 g by mouth 2 (two) times daily.   Yes Historical Provider, MD  pravastatin (PRAVACHOL) 40 MG tablet Take 40 mg by mouth daily.   Yes Historical Provider, MD  traMADol (ULTRAM) 50 MG tablet Take 50 mg by mouth every 6 (six) hours as needed for moderate pain or severe pain.   Yes Historical Provider, MD   BP 159/131  Pulse 73  Temp(Src) 98.6 F (37 C) (Oral)  Resp 15  SpO2 99% Physical Exam  Constitutional: She is oriented to person, place, and time. She appears well-developed and well-nourished. No distress.  HENT:  Head: Normocephalic and atraumatic.  Right Ear: Hearing normal.  Left Ear: Hearing normal.  Nose: Nose normal.  Mouth/Throat: Oropharynx is clear and moist and mucous membranes are normal.  Eyes: Conjunctivae and EOM are normal. Pupils are equal, round, and reactive to light.  Neck: Normal range of motion. Neck supple.  Cardiovascular: Regular rhythm, S1 normal and S2 normal.  Exam reveals no gallop and no friction rub.   No murmur heard. Pulmonary/Chest: Effort normal and breath sounds normal. No respiratory distress. She exhibits no tenderness.  Abdominal: Soft. Normal appearance and bowel sounds are  normal. There is no hepatosplenomegaly. There is generalized tenderness (mild, generalized). There is no rebound, no guarding, no tenderness at McBurney's point and negative Murphy's sign. No hernia.  Musculoskeletal: Normal range of motion.       Back:  Neurological: She is alert and oriented to person, place, and time. She has normal strength. No cranial nerve deficit or sensory deficit. Coordination normal. GCS eye subscore is 4. GCS verbal subscore is 5. GCS motor subscore is 6.  Skin: Skin is warm, dry and intact. No rash noted. No cyanosis.  Psychiatric: She has a normal mood and affect. Her speech is normal and behavior is normal. Thought content normal.    ED Course  Procedures (including critical care time) Labs Review Labs Reviewed  CBC WITH DIFFERENTIAL - Abnormal; Notable for the following:    Hemoglobin 15.5 (*)    All other components within normal limits  COMPREHENSIVE METABOLIC PANEL - Abnormal; Notable for the following:    Glucose, Bld 111 (*)    AST 402 (*)    ALT 593 (*)    Alkaline Phosphatase 310 (*)    GFR calc non Af Amer 79 (*)    All other components within normal limits  URINALYSIS, ROUTINE W REFLEX MICROSCOPIC - Abnormal; Notable for the following:    Ketones, ur 15 (*)    All other components within normal limits  LIPASE, BLOOD - Abnormal; Notable for the following:    Lipase 9 (*)    All other components within normal limits  TROPONIN I    Imaging Review Dg Thoracic Spine 2 View  12/03/2013   CLINICAL DATA:  Lower thoracic and lumbar pain with history of kyphoplasty  EXAM: THORACIC SPINE - 2 VIEW  COMPARISON:  PA and lateral chest x-ray dated November 20, 2013  FINDINGS: The thoracic vertebral bodies are preserved in height. There is compression of the body of L1. The thoracic disc space heights are well maintained. The pedicles appear intact. There are no abnormal paravertebral soft tissue densities.  IMPRESSION: There is no acute compression of the  thoracic vertebral bodies.   Electronically Signed   By: David  Martinique   On: 12/03/2013 12:43   Dg Lumbar Spine Complete  12/03/2013   CLINICAL DATA:  Low back pain with history of kyphoplasty  EXAM: LUMBAR SPINE - COMPLETE 4+ VIEW  COMPARISON:  Lumbar spine MRI dated November 21, 2013.  FINDINGS: There is wedge compression of the body of L1 with loss of height anteriorly of approximately 70%. This is stable. There is stable partial compressions of L3 and L4 treated with kyphoplasty. L2 and L5 are intact. There is facet joint hypertrophy at L4-5 and L5-S1. There is grade 1 anterolisthesis of L4 with respect to L5 which is stable.  IMPRESSION: There are old compressions of the bodies of L1, L3, and L4. There are degenerative facet joint changes at L4-5 and L5-S1. There is no acute lumbar spine abnormality.   Electronically Signed   By: David  Martinique   On: 12/03/2013 12:46   Ct Abdomen Pelvis W Contrast  12/03/2013   CLINICAL DATA:  Scan.  Back pain.  Abnormal LFTs.  EXAM: CT ABDOMEN AND PELVIS WITH CONTRAST  TECHNIQUE: Multidetector CT imaging of the abdomen and pelvis was performed using the standard protocol following bolus administration of intravenous contrast.  CONTRAST:  19mL OMNIPAQUE IOHEXOL 300 MG/ML  SOLN  COMPARISON:  Abdomen series 7/28 /2015.  CT 09/27/2013.  FINDINGS: No focal hepatic abnormality. Punctate stable lucency in the medial portion of the liver is unchanged consistent stable 4 mm cyst. Spleen is normal. Pancreas is normal. No biliary distention.  Adrenals normal. Punctate cyst anterior aspect midportion left kidney. This is unchanged. Kidneys are otherwise normal. No hydronephrosis or obstructing ureteral stone. The bladder is nondistended. Hysterectomy. No free pelvic fluid.  No significant inguinal or retroperitoneal adenopathy. Abdominal aorta normal in caliber. Visceral vessels are patent. Renal atherosclerotic vascular disease.  No inflammatory change in right or left lower quadrant.  Large amount of stool is present colon. No bowel distention. No free air. No mesenteric mass. No evidence of hernia. Stable changes of prior hiatal hernia surgery. If further evaluation is needed of the surgical site of the hiatal hernia repair, upper GI can be performed. Stomach is nondistended.  Lung bases are clear. Heart size normal. Multiple lumbar compression fractures with postsurgical change from prior vertebroplasty. Methylmethacrylate noted in the L2-3 disc space.  IMPRESSION: 1. No acute abnormality. 2. Postsurgical changes of a hiatal hernial repair, stable from prior CT of 09/27/2013. No bowel distention. No gastric distention. 3. Multiple lumbar compression fractures with postsurgical change, stable.   Electronically Signed   By: Marcello Moores  Register   On: 12/03/2013 15:28   Dg Abd Acute W/chest  12/03/2013   CLINICAL DATA:  Nausea  EXAM: ACUTE ABDOMEN SERIES (ABDOMEN 2 VIEW & CHEST 1 VIEW)  COMPARISON:  PA and lateral chest x-ray of November 20, 2013  FINDINGS: The lungs are mildly hyperinflated but clear. There is stable scarring just above the left lateral costophrenic angle. The heart and mediastinal structures are normal. The bony thorax exhibits no acute abnormality.  Within the abdomen there is a moderate stool burden throughout the colon. There is no ileus nor obstruction. There are surgical clips in the gallbladder fossa and in the region of the GE junction. The patient has undergone kyphoplasty at L3 and L4 in there is compression of the body of L1.  IMPRESSION: 1. COPD without evidence of acute cardiopulmonary abnormality. 2. The bowel gas pattern is nonspecific. There is no evidence of obstruction or perforation. There may be mild clinical constipation.   Electronically Signed   By: David  Martinique   On: 12/03/2013 12:49     EKG Interpretation  Date/Time:  Tuesday December 03 2013 12:12:42 EDT Ventricular Rate:  66 PR Interval:  142 QRS Duration: 81 QT Interval:  425 QTC Calculation:  445 R Axis:   71 Text Interpretation:  Sinus rhythm Low voltage, precordial leads Baseline  wander in lead(s) V2 Otherwise within normal limits Confirmed by Florentino Laabs   MD, Farhaan Mabee ([id number]) on 12/03/2013 12:18:15 PM      MDM   Final diagnoses:  None   abdominal pain  Intractable nausea  Elevated transaminases  Presents to the ER for evaluation of upper abdominal pain, nausea and inability T. Symptoms began 4 days ago. She does have a history of some chronic intermittent abdominal pain as well as nausea secondary to her previous paraesophageal hernia surgeries. Patient had nonspecific examination in arrival. There was diffuse upper abdominal tenderness without guarding or rebound. Patient has had only minimal improvement for morphine, Zofran, Reglan. She is afebrile and has a normal white blood cell count. No sign of anemia. Renal function and platelets are normal, but LFTs are grossly elevated. She has elevated AST (402), ALT (593), alkaline phosphatase (310) with normal total bilirubin (0.7). Lipase is normal. Patient has had previous cholecystectomy. CT scan was performed to evaluate for ductal dilatation and other abnormalities such as complications of her previous surgeries, no acute pathology was noted.  No uncomfortable, unable to take anything by mouth with these elevated LFTs of unclear etiology. Will require admission for further management. I have consulted [facility], will see the patient. Will admit to medicine.    Orpah Greek, MD 12/03/13 [phone]

## 2013-12-03 NOTE — H&P (Addendum)
[facility] History and Physical  [name] [name] GDJ:[id number] DOB: Mar 03, 1936 DOA: 12/03/2013  Referring physician: Orpah Greek, MD  PCP: Alesia Richards, MD   Chief Complaint: Nausea  HPI:  78 year old female with a history of coronary artery disease, hypertension, esophageal stricture, gastritis, gastroesophageal reflux disease comes in with nausea and back pain. The patient's back pain is chronic for which she takes Tylenol occasionally but has been taking it fairly frequently for the last couple of days. She also has a history of hiatal hernia repair and Nissen fundoplication and started experiencing nausea with dry heaving but no vomiting over the last couple of days. Of note is that the patient had a recent URI and was treated with Augmentin per Dr. Melvyn Novas for 10 days. She has not yet completed her Augmentin course. She denies any hematemesis hematochezia, any recent weight loss. She has seen Dr. [name] in the past for evaluation of her esophageal issues including EGD, esophageal manometry. She recently stopped taking her pravastatin Workup in the ER revealed AST of 402, ALT of 593, bilirubin is normal, CT scan abdomen and pelvis does not show any acute abnormality The patient also had a CT angiogram of her chest on 7/15 that showed fatty liver and extrahepatic biliary system appeared enlarged.      Review of Systems: negative for the following  Constitutional: Denies fever, chills, diaphoresis, appetite change and fatigue.  HEENT: Denies photophobia, eye pain, redness, hearing loss, ear pain, congestion, sore throat, rhinorrhea, sneezing, mouth sores, trouble swallowing, neck pain, neck stiffness and tinnitus.  Respiratory: Denies SOB, DOE, cough, chest tightness, and wheezing.  Cardiovascular: Denies chest pain, palpitations and leg swelling.  Gastrointestinal: Positive for nausea and abdominal pain.  Musculoskeletal: Positive for back pain Skin: Denies pallor,  rash and wound.  Neurological: Denies dizziness, seizures, syncope, weakness, light-headedness, numbness and headaches.  Hematological: Denies adenopathy. Easy bruising, personal or family bleeding history  Psychiatric/Behavioral: Denies suicidal ideation, mood changes, confusion, nervousness, sleep disturbance and agitation       Past Medical History  Diagnosis Date  . CAD (coronary artery disease)     LHC 2003 with 40% pLAD, 30% mLAD, 100% D1 (moderate vessel), 100%  D2 (small vessel), 95% mid small OM2.  Pt had PTCA to D1;  D2 and OM2 small vessels and tx medically;  Last Myoview (2012): anterior infarct seen with scar, no ischemia. EF normal. Pt managed medically;  Lexiscan Myoview (12/14):  Low risk; ant scar with very mild peri-infarct ischemia; EF 55% with ant AK   . Recurrent aspiration bronchitis/pneumonia     PFTs 12/09 FVC 97%, FEV1 101%, ratio 77%, TLC 109%. No significant obstruction or restriction.   . Hypertension   . Hyperlipidemia   . Esophageal stricture   . Gastritis   . GERD (gastroesophageal reflux disease)   . Asthma   . Blood transfusion   . Cough   . Wheezing   . Weakness   . Trouble swallowing   . Abdominal pain   . Hiatal hernia   . Shortness of breath   . Anxiety   . Complication of anesthesia   . PONV (postoperative nausea and vomiting)   . TIA (transient ischemic attack)   . BRONCHITIS, ACUTE 07/25/2007         . DIVERTICULOSIS, COLON 08/28/2008    Qualifier: Diagnosis of  By: [name], [name], [name]    . IRON DEFICIENCY ANEMIA, HX OF 07/24/2007    Qualifier: Diagnosis of  By: Tilden Dome    .  Chronic nausea   . Chronic back pain      Past Surgical History  Procedure Laterality Date  . Back surgery    . Bladder repair    . Hemorrhoid surgery    . Cholecystectomy      Patient unsure of date.  [name] Appendectomy      patient unsure of date  . Total abdominal hysterectomy  1975    partial  . Coronary angioplasty  11/16/2001  . Eye surgery   11/2000    bilateral cataracts with lens implant  . Hiatal hernia repair  06/03/2011    Procedure: LAPAROSCOPIC REPAIR OF HIATAL HERNIA;  Surgeon: Adin Hector, MD;  Location: [facility];  Service: General;  Laterality: N/A;  . Laparoscopic nissen fundoplication  1/61/0960    Procedure: LAPAROSCOPIC NISSEN FUNDOPLICATION;  Surgeon: Adin Hector, MD;  Location: [facility];  Service: General;  Laterality: N/A;  . Lumbar laminectomy/decompression microdiscectomy Right 06/26/2012    Procedure: LUMBAR LAMINECTOMY/DECOMPRESSION MICRODISCECTOMY;  Surgeon: Jessy Oto, MD;  Location: [facility];  Service: Orthopedics;  Laterality: Right;  RIGHT L4-5 MICRODISCECTOMY  . Esophagogastroduodenoscopy N/A 03/15/2013    Procedure: ESOPHAGOGASTRODUODENOSCOPY (EGD);  Surgeon: Jerene Bears, MD;  Location[facility];  Service: Endoscopy;  Laterality: N/A;  . Esophageal manometry N/A 03/25/2013    Procedure: ESOPHAGEAL MANOMETRY (EM);  Surgeon: Inda Castle, MD;  Location: [facility];  Service: Endoscopy;  Laterality: N/A;  . Hiatal hernia repair N/A 04/24/2013    Procedure: LAPAROSCOPIC  REPAIR RECURRENT PARASOPHAGEAL HIATAL HERNIA WITH FUNDOPLICATION;  Surgeon: Adin Hector, MD;  Location: [facility];  Service: General;  Laterality: N/A;  . Insertion of mesh N/A 04/24/2013    Procedure: INSERTION OF MESH;  Surgeon: Adin Hector, MD;  Location: [facility];  Service: General;  Laterality: N/A;  . Laparoscopic lysis of adhesions N/A 04/24/2013    Procedure: LAPAROSCOPIC LYSIS OF ADHESIONS;  Surgeon: Adin Hector, MD;  Location: [facility];  Service: General;  Laterality: N/A;      Social History:  reports that she has never smoked. She has never used smokeless tobacco. She reports that she does not drink alcohol or use illicit drugs.     Allergies  Allergen Reactions  . Aspartame Diarrhea and Nausea And Vomiting  . Daliresp [Roflumilast] Nausea And Vomiting  . Hydrocodone Nausea Only  . Levofloxacin Nausea  And Vomiting  . Tramadol Other (See Comments)    Blurred vision   . Ace Inhibitors Cough    Per Dr Melvyn Novas pulmonology 2013    Family History  Problem Relation Age of Onset  . Cancer Mother     colon   . Colon cancer Mother 72  . Heart disease Father 41    heart attack     Prior to Admission medications   Medication Sig Start Date End Date Taking? Authorizing Provider  Aclidinium Bromide (TUDORZA PRESSAIR) 400 MCG/ACT AEPB Inhale 1 puff into the lungs 2 (two) times daily as needed (wheezing).    Yes Historical Provider, MD  ALPRAZolam Duanne Moron) 0.5 MG tablet Take 1 tablet (0.5 mg total) by mouth 3 (three) times daily as needed for sleep or anxiety. 09/20/13  Yes [name], MD  amLODipine (NORVASC) 10 MG tablet Take 10 mg by mouth daily. For blood pressure   Yes Historical Provider, MD  Cholecalciferol (VITAMIN D3) 2000 UNITS TABS Take 6,000 Units by mouth daily.    Yes Historical Provider, MD  gabapentin (NEURONTIN) 300 MG capsule Take 1  capsule (300 mg total) by mouth 3 (three) times daily. 07/08/13 01/08/15 Yes [name], MD  losartan (COZAAR) 100 MG tablet Take 100 mg by mouth daily.   Yes Historical Provider, MD  metoCLOPramide (REGLAN) 10 MG tablet Take 5-10 mg by mouth 4 (four) times daily as needed for nausea (and reflux).   Yes Historical Provider, MD  metoprolol tartrate (LOPRESSOR) 25 MG tablet Take 12.5 mg by mouth at bedtime.   Yes Historical Provider, MD  mometasone-formoterol (DULERA) 200-5 MCG/ACT AERO Inhale 2 puffs into the lungs 2 (two) times daily. 07/03/13  Yes Melissa R Smith, PA-C  ondansetron (ZOFRAN ODT) 8 MG disintegrating tablet Take 1 tablet (8 mg total) by mouth every 8 (eight) hours as needed for nausea or vomiting. For nausea 09/20/13 09/21/14 Yes [name], MD  OVER THE COUNTER MEDICATION Take 1,000 mg by mouth daily. Coral Calcium 1000mg    Yes Historical Provider, MD  OVER THE COUNTER MEDICATION Take 400 mg by mouth 3 (three) times daily. Magnesium  Carbonate 400mg    Yes Historical Provider, MD  polyethylene glycol (MIRALAX / GLYCOLAX) packet Take 17 g by mouth 2 (two) times daily.   Yes Historical Provider, MD  pravastatin (PRAVACHOL) 40 MG tablet Take 40 mg by mouth daily.   Yes Historical Provider, MD  traMADol (ULTRAM) 50 MG tablet Take 50 mg by mouth every 6 (six) hours as needed for moderate pain or severe pain.   Yes Historical Provider, MD     Physical Exam: Filed Vitals:   12/03/13 1645 12/03/13 1700 12/03/13 1715 12/03/13 1805  BP: 148/97  147/95 174/75  Pulse: 76 76 83 76  Temp:    98.2 F (36.8 C)  TempSrc:    Oral  Resp: 12 19  16   SpO2: 99% 93% 98% 96%     Constitutional: Vital signs reviewed. Patient is a well-developed and well-nourished in no acute distress and cooperative with exam. Alert and oriented x3.  Head: Normocephalic and atraumatic  Ear: TM normal bilaterally  Mouth: no erythema or exudates, MMM  Eyes: PERRL, EOMI, conjunctivae normal, No scleral icterus.  Neck: Supple, Trachea midline normal ROM, No JVD, mass, thyromegaly, or carotid bruit present.  Cardiovascular: RRR, S1 normal, S2 normal, no MRG, pulses symmetric and intact bilaterally  Pulmonary/Chest: CTAB, no wheezes, rales, or rhonchi  Abdominal: Soft. Non-tender, non-distended, bowel sounds are normal, no masses, organomegaly, or guarding present.  GU: no CVA tenderness Musculoskeletal: No joint deformities, erythema, or stiffness, ROM full and no nontender Ext: no edema and no cyanosis, pulses palpable bilaterally (DP and PT)  Hematology: no cervical, inginal, or axillary adenopathy.  Neurological: A&O x3, Strenght is normal and symmetric bilaterally, cranial nerve II-XII are grossly intact, no focal motor deficit, sensory intact to light touch bilaterally.  Skin: Warm, dry and intact. No rash, cyanosis, or clubbing.  Psychiatric: Normal mood and affect. speech and behavior is normal. Judgment and thought content normal. Cognition and memory  are normal.       Labs on Admission:    Basic Metabolic Panel:  Recent Labs Lab 12/03/13 1040  NA 142  K 4.6  CL 101  CO2 26  GLUCOSE 111*  BUN 12  CREATININE 0.78  CALCIUM 9.9   Liver Function Tests:  Recent Labs Lab 11/29/13 0954 12/03/13 1040  AST 74* 402*  ALT 116* 593*  ALKPHOS 140* 310*  BILITOT 0.6 0.7  PROT 6.8 7.8  ALBUMIN 4.1 4.1    Recent Labs Lab 12/03/13 1040  LIPASE  9*   No results found for this basename: AMMONIA,  in the last 168 hours CBC:  Recent Labs Lab 12/03/13 1040  WBC 5.8  NEUTROABS 4.0  HGB 15.5*  HCT 44.4  MCV 89.3  PLT 343   Cardiac Enzymes:  Recent Labs Lab 12/03/13 1040 12/03/13 1635  CKTOTAL  --  36  TROPONINI <0.30  --     BNP (last 3 results) No results found for this basename: PROBNP,  in the last 8760 hours    CBG: No results found for this basename: GLUCAP,  in the last 168 hours  Radiological Exams on Admission: Dg Thoracic Spine 2 View  12/03/2013   CLINICAL DATA:  Lower thoracic and lumbar pain with history of kyphoplasty  EXAM: THORACIC SPINE - 2 VIEW  COMPARISON:  PA and lateral chest x-ray dated November 20, 2013  FINDINGS: The thoracic vertebral bodies are preserved in height. There is compression of the body of L1. The thoracic disc space heights are well maintained. The pedicles appear intact. There are no abnormal paravertebral soft tissue densities.  IMPRESSION: There is no acute compression of the thoracic vertebral bodies.   Electronically Signed   By: David  Martinique   On: 12/03/2013 12:43   Dg Lumbar Spine Complete  12/03/2013   CLINICAL DATA:  Low back pain with history of kyphoplasty  EXAM: LUMBAR SPINE - COMPLETE 4+ VIEW  COMPARISON:  Lumbar spine MRI dated November 21, 2013.  FINDINGS: There is wedge compression of the body of L1 with loss of height anteriorly of approximately 70%. This is stable. There is stable partial compressions of L3 and L4 treated with kyphoplasty. L2 and L5 are intact.  There is facet joint hypertrophy at L4-5 and L5-S1. There is grade 1 anterolisthesis of L4 with respect to L5 which is stable.  IMPRESSION: There are old compressions of the bodies of L1, L3, and L4. There are degenerative facet joint changes at L4-5 and L5-S1. There is no acute lumbar spine abnormality.   Electronically Signed   By: David  Martinique   On: 12/03/2013 12:46   Ct Abdomen Pelvis W Contrast  12/03/2013   CLINICAL DATA:  Scan.  Back pain.  Abnormal LFTs.  EXAM: CT ABDOMEN AND PELVIS WITH CONTRAST  TECHNIQUE: Multidetector CT imaging of the abdomen and pelvis was performed using the standard protocol following bolus administration of intravenous contrast.  CONTRAST:  21mL OMNIPAQUE IOHEXOL 300 MG/ML  SOLN  COMPARISON:  Abdomen series 7/28 /2015.  CT 09/27/2013.  FINDINGS: No focal hepatic abnormality. Punctate stable lucency in the medial portion of the liver is unchanged consistent stable 4 mm cyst. Spleen is normal. Pancreas is normal. No biliary distention.  Adrenals normal. Punctate cyst anterior aspect midportion left kidney. This is unchanged. Kidneys are otherwise normal. No hydronephrosis or obstructing ureteral stone. The bladder is nondistended. Hysterectomy. No free pelvic fluid.  No significant inguinal or retroperitoneal adenopathy. Abdominal aorta normal in caliber. Visceral vessels are patent. Renal atherosclerotic vascular disease.  No inflammatory change in right or left lower quadrant. Large amount of stool is present colon. No bowel distention. No free air. No mesenteric mass. No evidence of hernia. Stable changes of prior hiatal hernia surgery. If further evaluation is needed of the surgical site of the hiatal hernia repair, upper GI can be performed. Stomach is nondistended.  Lung bases are clear. Heart size normal. Multiple lumbar compression fractures with postsurgical change from prior vertebroplasty. Methylmethacrylate noted in the L2-3 disc space.  IMPRESSION:  1. No acute  abnormality. 2. Postsurgical changes of a hiatal hernial repair, stable from prior CT of 09/27/2013. No bowel distention. No gastric distention. 3. Multiple lumbar compression fractures with postsurgical change, stable.   Electronically Signed   By: Marcello Moores  Register   On: 12/03/2013 15:28   Dg Abd Acute W/chest  12/03/2013   CLINICAL DATA:  Nausea  EXAM: ACUTE ABDOMEN SERIES (ABDOMEN 2 VIEW & CHEST 1 VIEW)  COMPARISON:  PA and lateral chest x-ray of November 20, 2013  FINDINGS: The lungs are mildly hyperinflated but clear. There is stable scarring just above the left lateral costophrenic angle. The heart and mediastinal structures are normal. The bony thorax exhibits no acute abnormality.  Within the abdomen there is a moderate stool burden throughout the colon. There is no ileus nor obstruction. There are surgical clips in the gallbladder fossa and in the region of the GE junction. The patient has undergone kyphoplasty at L3 and L4 in there is compression of the body of L1.  IMPRESSION: 1. COPD without evidence of acute cardiopulmonary abnormality. 2. The bowel gas pattern is nonspecific. There is no evidence of obstruction or perforation. There may be mild clinical constipation.   Electronically Signed   By: David  Martinique   On: 12/03/2013 12:49    EKG: Independently reviewed  Assessment/Plan Active Problems:   Abnormal transaminases   Transaminitis  Abnormal liver function Likely cholestasis secondary to Augmentin? Tylenol level, salicylate level was negative Acute hepatitis panel pending Check ANA, antimitochondrial antibody Right upper quadrant ultrasound is pending Dr. [name] from gastroenterology consulted PCP recently discontinued pravastatin  Hypertension Continue metoprolol and Norvasc, Cozaar, metoprolol  Nausea likely related to abnormal liver function, also has a history of hiatal hernia repair Continue PPI  Asthma Continue dulera  Code Status:   full Family Communication:  bedside Disposition Plan: admit   Time spent: 70 mins   [facility] Pager [phone]  If 7PM-7AM, please contact night-coverage [email or web address] Password [facility] 12/03/2013, 6:29 PM

## 2013-12-03 NOTE — ED Notes (Signed)
Report attempted x 1

## 2013-12-04 ENCOUNTER — Encounter (HOSPITAL_COMMUNITY): Payer: Self-pay | Admitting: General Practice

## 2013-12-04 DIAGNOSIS — R110 Nausea: Secondary | ICD-10-CM

## 2013-12-04 LAB — CBC
HCT: 42 % (ref 36.0–46.0)
Hemoglobin: 13.6 g/dL (ref 12.0–15.0)
MCH: 29.6 pg (ref 26.0–34.0)
MCHC: 32.4 g/dL (ref 30.0–36.0)
MCV: 91.5 fL (ref 78.0–100.0)
Platelets: 330 10*3/uL (ref 150–400)
RBC: 4.59 MIL/uL (ref 3.87–5.11)
RDW: 15.6 % — ABNORMAL HIGH (ref 11.5–15.5)
WBC: 6.3 10*3/uL (ref 4.0–10.5)

## 2013-12-04 LAB — COMPREHENSIVE METABOLIC PANEL
ALBUMIN: 3.4 g/dL — AB (ref 3.5–5.2)
ALT: 371 U/L — AB (ref 0–35)
AST: 170 U/L — ABNORMAL HIGH (ref 0–37)
Alkaline Phosphatase: 232 U/L — ABNORMAL HIGH (ref 39–117)
Anion gap: 17 — ABNORMAL HIGH (ref 5–15)
BUN: 10 mg/dL (ref 6–23)
CALCIUM: 9.1 mg/dL (ref 8.4–10.5)
CO2: 21 mEq/L (ref 19–32)
Chloride: 100 mEq/L (ref 96–112)
Creatinine, Ser: 0.67 mg/dL (ref 0.50–1.10)
GFR calc non Af Amer: 83 mL/min — ABNORMAL LOW (ref >90)
GLUCOSE: 89 mg/dL (ref 70–99)
Potassium: 4.1 mEq/L (ref 3.7–5.3)
SODIUM: 138 meq/L (ref 137–147)
Total Bilirubin: 0.5 mg/dL (ref 0.3–1.2)
Total Protein: 6.5 g/dL (ref 6.0–8.3)

## 2013-12-04 LAB — HEMOGLOBIN A1C
Hgb A1c MFr Bld: 5.9 % — ABNORMAL HIGH (ref <5.7)
Mean Plasma Glucose: 123 mg/dL — ABNORMAL HIGH (ref <117)

## 2013-12-04 LAB — TROPONIN I

## 2013-12-04 LAB — ANA: Anti Nuclear Antibody(ANA): NEGATIVE

## 2013-12-04 MED ORDER — POLYETHYLENE GLYCOL 3350 17 G PO PACK
17.0000 g | PACK | Freq: Two times a day (BID) | ORAL | Status: DC
  Administered 2013-12-04: 17 g via ORAL
  Filled 2013-12-04 (×4): qty 1

## 2013-12-04 MED ORDER — FLEET ENEMA 7-19 GM/118ML RE ENEM
1.0000 | ENEMA | Freq: Once | RECTAL | Status: AC
  Administered 2013-12-04: 1 via RECTAL
  Filled 2013-12-04: qty 1

## 2013-12-04 MED ORDER — SENNA 8.6 MG PO TABS
2.0000 | ORAL_TABLET | Freq: Every day | ORAL | Status: DC
  Administered 2013-12-04: 17.2 mg via ORAL
  Filled 2013-12-04 (×2): qty 2

## 2013-12-04 NOTE — Consult Note (Signed)
[facility] Consult: 8:56 AM 12/04/2013  LOS: 1 day    Referring Provider: Dr. Betsey Holiday Primary Care Physician:  Alesia Richards, MD Primary Gastroenterologist:  Dr. [name]    Reason for Consultation:  Abnormal LFTs   HPI: [name] [name] is a 78 y.o. female with a history of GERD, hiatal hernia s/p fundoplication, esophageal stricture, cholecystectomy, and degenerative disc disease,  who presented to [facility] for evaluation of nausea and anorexia per patent. Workup in the ED showed her LFTs to be elevated at AST=402, ALT=593, Alk Phos=310 with a normal total bilirubin (0.7) and lipase, prompting this GI referral. Pt has chronic back pain from compression fractures treated with kyphoplasty for which she takes tramadol and gabapentin, and occasional tyleonl.   She c/o intermittent nausea without vomiting and decreased appetite since her fundoplication surgery in Dec of 2015. She was recently given Augmentin for a sinus infection on 11/19/13 which she thinks has made her nausea worse.  She took it normally for 3-4 days (1 pill BID) and then began taking half a pill in the morning and at night.  She threw the rest away when she had 6 pills left, thinking it was the cause of her worsening symptoms, but has had no relief.  She also states she has noticed a change in stool caliber, but thinks it is intermittent, with normal BMs in between small "worm shaped" stool. She sometimes has problems with constipation but takes an OTC stool softener she gets from [employer]. She also has diffuse abdominal pain she describes as cramping worse in her LLQ.  She states she has had this pain since her hiatal hernia surgery, and it has gotten worse in the past week. She states the only relief she has had was from the morphine in the ER. She states since her fundoplication, she has gone from  172 lbs to 125 lbs at home. She denies hematemesis, hematochezia, melana, diarrhea, fever, fatigue, cough, SOB, or Chest pain. She states she feels like she puts out less urine, but denies hematuria, dysuria, or strong odor.  Past Medical History  Diagnosis Date  . CAD (coronary artery disease)     LHC 2003 with 40% pLAD, 30% mLAD, 100% D1 (moderate vessel), 100%  D2 (small vessel), 95% mid small OM2.  Pt had PTCA to D1;  D2 and OM2 small vessels and tx medically;  Last Myoview (2012): anterior infarct seen with scar, no ischemia. EF normal. Pt managed medically;  Lexiscan Myoview (12/14):  Low risk; ant scar with very mild peri-infarct ischemia; EF 55% with ant AK   . Recurrent aspiration bronchitis/pneumonia     PFTs 12/09 FVC 97%, FEV1 101%, ratio 77%, TLC 109%. No significant obstruction or restriction.   . Hypertension   . Hyperlipidemia   . Esophageal stricture   . Gastritis   . GERD (gastroesophageal reflux disease)   . Asthma   . Blood transfusion   . Cough   . Wheezing   . Weakness   . Trouble swallowing   . Abdominal pain   . Hiatal hernia   .  Shortness of breath   . Anxiety   . Complication of anesthesia   . PONV (postoperative nausea and vomiting)   . TIA (transient ischemic attack)   . BRONCHITIS, ACUTE 07/25/2007         . DIVERTICULOSIS, COLON 08/28/2008    Qualifier: Diagnosis of  By: [name], [name], [name]    . IRON DEFICIENCY ANEMIA, HX OF 07/24/2007    Qualifier: Diagnosis of  By: Tilden Dome    . Chronic nausea   . Chronic back pain     Past Surgical History  Procedure Laterality Date  . Back surgery    . Bladder repair    . Hemorrhoid surgery    . Cholecystectomy      Patient unsure of date.  [name] Appendectomy      patient unsure of date  . Total abdominal hysterectomy  1975    partial  . Coronary angioplasty  11/16/2001  . Eye surgery  11/2000    bilateral cataracts with lens implant  . Hiatal hernia repair  06/03/2011    Procedure: LAPAROSCOPIC  REPAIR OF HIATAL HERNIA;  Surgeon: Adin Hector, MD;  Location: [facility];  Service: General;  Laterality: N/A;  . Laparoscopic nissen fundoplication  0/93/2671    Procedure: LAPAROSCOPIC NISSEN FUNDOPLICATION;  Surgeon: Adin Hector, MD;  Location: [facility];  Service: General;  Laterality: N/A;  . Lumbar laminectomy/decompression microdiscectomy Right 06/26/2012    Procedure: LUMBAR LAMINECTOMY/DECOMPRESSION MICRODISCECTOMY;  Surgeon: Jessy Oto, MD;  Location: [facility];  Service: Orthopedics;  Laterality: Right;  RIGHT L4-5 MICRODISCECTOMY  . Esophagogastroduodenoscopy N/A 03/15/2013    Procedure: ESOPHAGOGASTRODUODENOSCOPY (EGD);  Surgeon: Jerene Bears, MD;  Location: [facility];  Service: Endoscopy;  Laterality: N/A;  . Esophageal manometry N/A 03/25/2013    Procedure: ESOPHAGEAL MANOMETRY (EM);  Surgeon: Inda Castle, MD;  Location: [facility];  Service: Endoscopy;  Laterality: N/A;  . Hiatal hernia repair N/A 04/24/2013    Procedure: LAPAROSCOPIC  REPAIR RECURRENT PARASOPHAGEAL HIATAL HERNIA WITH FUNDOPLICATION;  Surgeon: Adin Hector, MD;  Location: [facility];  Service: General;  Laterality: N/A;  . Insertion of mesh N/A 04/24/2013    Procedure: INSERTION OF MESH;  Surgeon: Adin Hector, MD;  Location: [facility];  Service: General;  Laterality: N/A;  . Laparoscopic lysis of adhesions N/A 04/24/2013    Procedure: LAPAROSCOPIC LYSIS OF ADHESIONS;  Surgeon: Adin Hector, MD;  Location: [facility];  Service: General;  Laterality: N/A;    Prior to Admission medications   Medication Sig Start Date End Date Taking? Authorizing Provider  Aclidinium Bromide (TUDORZA PRESSAIR) 400 MCG/ACT AEPB Inhale 1 puff into the lungs 2 (two) times daily as needed (wheezing).    Yes Historical Provider, MD  ALPRAZolam Duanne Moron) 0.5 MG tablet Take 1 tablet (0.5 mg total) by mouth 3 (three) times daily as needed for sleep or anxiety. 09/20/13  Yes [name], MD  amLODipine (NORVASC) 10 MG tablet Take 10  mg by mouth daily. For blood pressure   Yes Historical Provider, MD  Cholecalciferol (VITAMIN D3) 2000 UNITS TABS Take 6,000 Units by mouth daily.    Yes Historical Provider, MD  gabapentin (NEURONTIN) 300 MG capsule Take 1 capsule (300 mg total) by mouth 3 (three) times daily. 07/08/13 01/08/15 Yes [name], MD  losartan (COZAAR) 100 MG tablet Take 100 mg by mouth daily.   Yes Historical Provider, MD  metoCLOPramide (REGLAN) 10 MG tablet Take 5-10 mg by mouth 4 (four) times daily as  needed for nausea (and reflux).   Yes Historical Provider, MD  metoprolol tartrate (LOPRESSOR) 25 MG tablet Take 12.5 mg by mouth at bedtime.   Yes Historical Provider, MD  mometasone-formoterol (DULERA) 200-5 MCG/ACT AERO Inhale 2 puffs into the lungs 2 (two) times daily. 07/03/13  Yes Melissa R Smith, PA-C  ondansetron (ZOFRAN ODT) 8 MG disintegrating tablet Take 1 tablet (8 mg total) by mouth every 8 (eight) hours as needed for nausea or vomiting. For nausea 09/20/13 09/21/14 Yes [name], MD  OVER THE COUNTER MEDICATION Take 1,000 mg by mouth daily. Coral Calcium 1070m   Yes Historical Provider, MD  OVER THE COUNTER MEDICATION Take 400 mg by mouth 3 (three) times daily. Magnesium Carbonate 4073m  Yes Historical Provider, MD  polyethylene glycol (MIRALAX / GLYCOLAX) packet Take 17 g by mouth 2 (two) times daily.   Yes Historical Provider, MD  pravastatin (PRAVACHOL) 40 MG tablet Take 40 mg by mouth daily.   Yes Historical Provider, MD  traMADol (ULTRAM) 50 MG tablet Take 50 mg by mouth every 6 (six) hours as needed for moderate pain or severe pain.   Yes Historical Provider, MD    Scheduled Meds: . amLODipine  10 mg Oral Daily  . enoxaparin (LOVENOX) injection  40 mg Subcutaneous Q24H  . gabapentin  300 mg Oral TID  . losartan  100 mg Oral Daily  . metoprolol tartrate  12.5 mg Oral QHS  . mometasone-formoterol  2 puff Inhalation BID  . sodium chloride  3 mL Intravenous Q12H  . sodium phosphate  1 enema  Rectal Once   Infusions: . sodium chloride 75 mL/hr at 12/04/13 0722   PRN Meds: ALPRAZolam, guaiFENesin-dextromethorphan, hydrALAZINE, HYDROmorphone (DILAUDID) injection, levalbuterol, ondansetron (ZOFRAN) IV, ondansetron, traMADol   Allergies as of 12/03/2013 - Review Complete 12/03/2013  Allergen Reaction Noted  . Aspartame Diarrhea and Nausea And Vomiting   . Daliresp [roflumilast] Nausea And Vomiting 05/23/2011  . Hydrocodone Nausea Only 05/16/2013  . Levofloxacin Nausea And Vomiting 11/04/2013  . Tramadol Other (See Comments) 10/22/2013  . Ace inhibitors Cough 04/03/2013    Family History  Problem Relation Age of Onset  . Cancer Mother     colon   . Colon cancer Mother 6541. Heart disease Father 5470  heart attack    History   Social History  . Marital Status: Married    Spouse Name: N/A    Number of Children: N/A  . Years of Education: N/A   Occupational History  . Retired     worked at [employer]. Smoking status: Never Smoker   . Smokeless tobacco: Never Used  . Alcohol Use: No  . Drug Use: No  . Sexual Activity: No   Other Topics Concern  . Not on file   Social History Narrative   Lives with husband.    REVIEW OF SYSTEMS: Constitutional: +Weight loss, negative fever, fatigue Pulm:  Denies SOB, cough CV:  No palpitations, no LE edema.  GU:  +Decreased output, No hematuria, no frequency GI:  + nausea, LLQ pain, change in stool caliber. Negative vomiting, blood in stool or vomit, diarrhea. Neuro:  No headaches, no peripheral tingling or numbness Derm:  No itching, no rash or sores.  Endocrine: + Chills No sweats.  No polyuria or dysuria Immunization:  Up to date per patient. Travel:  None beyond local counties in last few months.    PHYSICAL EXAM: Vital signs  in last 24 hours: Filed Vitals:   12/04/13 0441  BP: 112/66  Pulse: 73  Temp: 98.5 F (36.9 C)  Resp: 16   Wt Readings from Last 3  Encounters:  11/19/13 132 lb (59.875 kg)  11/14/13 127 lb (57.607 kg)  11/05/13 125 lb (56.7 kg)    General: WDWN female  in NAD HEENT: Normocephalic, Atraumatic.  Without mass or lesion.  Membranes pink and moist. Lungs:  CTA bilaterally.  Unlabored. Heart: RRR, No MGR noted Abdomen:  Soft, ND.  +BS. Negative Rebound. Without hernia, No HSM appreciated. RLQ incisional scar. Mild tenderness diffusely with severe deep palpation tenderness to LLQ. Musc/Skeltl: No gross abnormalities. Scars on low back from previous back surgeries. Extremities: No peripheral edema.  2+ DP and radial pulses. Neurologic:  A&O to person, place, and time. Skin:  Without rash Psych:  Normal mood and affect.  Cooperative.  Intake/Output from previous day:   Intake/Output this shift:    LAB RESULTS:  Recent Labs  12/03/13 1040 12/03/13 2030 12/04/13 0014  WBC 5.8 7.5 6.3  HGB 15.5* 14.4 13.6  HCT 44.4 44.2 42.0  PLT 343 372 330   BMET Lab Results  Component Value Date   NA 138 12/04/2013   NA 142 12/03/2013   NA 139 11/20/2013   K 4.1 12/04/2013   K 4.6 12/03/2013   K 4.2 11/20/2013   CL 100 12/04/2013   CL 101 12/03/2013   CL 99 11/20/2013   CO2 21 12/04/2013   CO2 26 12/03/2013   CO2 23 11/20/2013   GLUCOSE 89 12/04/2013   GLUCOSE 111* 12/03/2013   GLUCOSE 133* 11/20/2013   BUN 10 12/04/2013   BUN 12 12/03/2013   BUN 9 11/20/2013   CREATININE 0.67 12/04/2013   CREATININE 0.79 12/03/2013   CREATININE 0.78 12/03/2013   CALCIUM 9.1 12/04/2013   CALCIUM 9.9 12/03/2013   CALCIUM 9.7 11/20/2013   LFT  Recent Labs  12/03/13 1040 12/04/13 0014  PROT 7.8 6.5  ALBUMIN 4.1 3.4*  AST 402* 170*  ALT 593* 371*  ALKPHOS 310* 232*  BILITOT 0.7 0.5   PT/INR Lab Results  Component Value Date   INR 0.97 12/03/2013   INR 0.94 11/05/2013   INR 0.96 08/20/2013   Hepatitis Panel  Recent Labs  12/03/13 1635  HEPBSAG NEGATIVE  HCVAB NEGATIVE  HEPAIGM NON REACTIVE  HEPBIGM NON REACTIVE   C-Diff No  components found with this basename: cdiff   Lipase     Component Value Date/Time   LIPASE 9* 12/03/2013 1040    Drugs of Abuse     Component Value Date/Time   LABOPIA POSITIVE* 12/03/2013 1318   COCAINSCRNUR NONE DETECTED 12/03/2013 1318   LABBENZ NONE DETECTED 12/03/2013 1318   AMPHETMU NONE DETECTED 12/03/2013 1318   THCU NONE DETECTED 12/03/2013 1318   LABBARB NONE DETECTED 12/03/2013 1318     RADIOLOGY STUDIES: Dg Thoracic Spine 2 View  12/03/2013   CLINICAL DATA:  Lower thoracic and lumbar pain with history of kyphoplasty  EXAM: THORACIC SPINE - 2 VIEW  COMPARISON:  PA and lateral chest x-ray dated November 20, 2013  FINDINGS: The thoracic vertebral bodies are preserved in height. There is compression of the body of L1. The thoracic disc space heights are well maintained. The pedicles appear intact. There are no abnormal paravertebral soft tissue densities.  IMPRESSION: There is no acute compression of the thoracic vertebral bodies.   Electronically Signed   By: David  Martinique   On:  12/03/2013 12:43   Dg Lumbar Spine Complete  12/03/2013   CLINICAL DATA:  Low back pain with history of kyphoplasty  EXAM: LUMBAR SPINE - COMPLETE 4+ VIEW  COMPARISON:  Lumbar spine MRI dated November 21, 2013.  FINDINGS: There is wedge compression of the body of L1 with loss of height anteriorly of approximately 70%. This is stable. There is stable partial compressions of L3 and L4 treated with kyphoplasty. L2 and L5 are intact. There is facet joint hypertrophy at L4-5 and L5-S1. There is grade 1 anterolisthesis of L4 with respect to L5 which is stable.  IMPRESSION: There are old compressions of the bodies of L1, L3, and L4. There are degenerative facet joint changes at L4-5 and L5-S1. There is no acute lumbar spine abnormality.   Electronically Signed   By: David  Martinique   On: 12/03/2013 12:46   Ct Abdomen Pelvis W Contrast  12/03/2013   CLINICAL DATA:  Scan.  Back pain.  Abnormal LFTs.  EXAM: CT ABDOMEN AND PELVIS  WITH CONTRAST  TECHNIQUE: Multidetector CT imaging of the abdomen and pelvis was performed using the standard protocol following bolus administration of intravenous contrast.  CONTRAST:  39m OMNIPAQUE IOHEXOL 300 MG/ML  SOLN  COMPARISON:  Abdomen series 7/28 /2015.  CT 09/27/2013.  FINDINGS: No focal hepatic abnormality. Punctate stable lucency in the medial portion of the liver is unchanged consistent stable 4 mm cyst. Spleen is normal. Pancreas is normal. No biliary distention.  Adrenals normal. Punctate cyst anterior aspect midportion left kidney. This is unchanged. Kidneys are otherwise normal. No hydronephrosis or obstructing ureteral stone. The bladder is nondistended. Hysterectomy. No free pelvic fluid.  No significant inguinal or retroperitoneal adenopathy. Abdominal aorta normal in caliber. Visceral vessels are patent. Renal atherosclerotic vascular disease.  No inflammatory change in right or left lower quadrant. Large amount of stool is present colon. No bowel distention. No free air. No mesenteric mass. No evidence of hernia. Stable changes of prior hiatal hernia surgery. If further evaluation is needed of the surgical site of the hiatal hernia repair, upper GI can be performed. Stomach is nondistended.  Lung bases are clear. Heart size normal. Multiple lumbar compression fractures with postsurgical change from prior vertebroplasty. Methylmethacrylate noted in the L2-3 disc space.  IMPRESSION: 1. No acute abnormality. 2. Postsurgical changes of a hiatal hernial repair, stable from prior CT of 09/27/2013. No bowel distention. No gastric distention. 3. Multiple lumbar compression fractures with postsurgical change, stable.   Electronically Signed   By: TMarcello Moores Register   On: 12/03/2013 15:28   Dg Abd Acute W/chest  12/03/2013   CLINICAL DATA:  Nausea  EXAM: ACUTE ABDOMEN SERIES (ABDOMEN 2 VIEW & CHEST 1 VIEW)  COMPARISON:  PA and lateral chest x-ray of November 20, 2013  FINDINGS: The lungs are mildly  hyperinflated but clear. There is stable scarring just above the left lateral costophrenic angle. The heart and mediastinal structures are normal. The bony thorax exhibits no acute abnormality.  Within the abdomen there is a moderate stool burden throughout the colon. There is no ileus nor obstruction. There are surgical clips in the gallbladder fossa and in the region of the GE junction. The patient has undergone kyphoplasty at L3 and L4 in there is compression of the body of L1.  IMPRESSION: 1. COPD without evidence of acute cardiopulmonary abnormality. 2. The bowel gas pattern is nonspecific. There is no evidence of obstruction or perforation. There may be mild clinical constipation.   Electronically Signed  By: David  Martinique   On: 12/03/2013 12:49   US Abdomen Limited Ruq  12/03/2013   CLINICAL DATA:  Increased LFTs.  Abdominal pain.  Nausea.  EXAM: US ABDOMEN LIMITED - RIGHT UPPER QUADRANT  COMPARISON:  CT 12/03/2013  FINDINGS: Gallbladder:  Surgically absent  Common bile duct:  Diameter: 7 mm  Liver:  Within normal limits in parenchymal echogenicity. Sub cm hypoechoic lesion within the hepatic parenchyma, right hepatic lobe, too small to characterize.  IMPRESSION: Common bile duct measures 7 mm.  Status post cholecystectomy.   Electronically Signed   By: Lovey Newcomer M.D.   On: 12/03/2013 19:50    ENDOSCOPIC STUDIES: Esophageal motility test 08/14/13 by Dr. Melford Aase Endoscopy 03/15/2013 by Dr Hilarie Fredrickson -Hiatal Hernia Colonoscopy 04/07/2011 by Dr. Melford Aase - Small polyps, internal hemorrhoids.  F/u 5 years.  -IMPRESSION:   - Nausea without vomiting - minimally responsive to zofran. Chronic problem. Seen last month in office for same. May need some type of swallowing study continued worsening. Upper GI series in ER on 10/22/13 showed delayed transit of contrast of the esophagus into the stomach with tertiary contractions likely 2/2 fundoplication.  - Abnormal LFTs - Indeterminate origin.Trending down since  admit. Pt was on augmentin for sinus infection but stopped several days ago.  CT on 11/20/13 showed fatty liver, but CT and [place] on 12/03/13 do not mention this, only a lesion too small to be characterized in the hepatic parenchyma.   - LLQ Abdominal pain- progressively worsened since fundoplication in December of 2015.     PLAN:     - Nausea without vomiting - Continue on Zofran, may need to switch to IV. Pt managing clears, continue to monitor nutrition status. May need EGD if continues to worsen. - Abnormal LFTs - May be drug induced. Will continue to monitor. - LLQ Abdominal pain - seems chronic as well. CT shows large amount of stool in colon without distention.  - Will consult Dr. Carlean Purl for recommendations of inpatient GI treatment.  Lollie Marrow  12/04/2013, 8:56 AM [place] Pager: [phone]    I have personally seen the patient, reviewed and repeated key elements of the history and physical and participated in formation of the assessment and plan the student has documented.  Nausea is chronic and seems to be related to fundoplication. Not sure why she has it from that - vagal nerve damage seems unlikley given ok gastric emptying and no vomiting.  Will treat w/ metaclopramide.  Exacerbation of nausea + dry heaves and the LFT changes are most likely a reaction to Augmentin, I think.  Would continue supportive care and unless fails to progress - home 1-2 days with outpatient LFT f/u.  Gatha Mayer, MD, [facility] [phone] (pager) 12/04/2013 3:31 PM

## 2013-12-04 NOTE — Progress Notes (Signed)
PATIENT DETAILS Name: [name] [name] Age: 78 y.o. Sex: female Date of Birth: 1936-01-19 Admit Date: 12/03/2013 Admitting Physician Reyne Dumas, MD YSA:[id number],UXNATFT DAVID, MD  Subjective: Patient complains of LLQ and back pain. Back pain is chronic but currently exacerbated. She has been treating with tylenol. Before going to the ED she took 3 250mg  tablets of Tylenol. Patient has been nauseated but no vomiting. She takes miralax 3x daily but has not had it since admission to the hospital. She had a bowel movement yesterday. No hematochezia or melena. Patient denies fever, headache, dizziness, chest pain, SOB, dysuria or diarrhea.    PHYSICAL EXAM: Vital signs in last 24 hours: Filed Vitals:   12/03/13 1805 12/03/13 2036 12/04/13 0441 12/04/13 0756  BP: 174/75 166/77 112/66   Pulse: 76 72 73   Temp: 98.2 F (36.8 C) 98.3 F (36.8 C) 98.5 F (36.9 C)   TempSrc: Oral Oral Oral   Resp: 16 16 16    SpO2: 96% 96% 96% 95%    Gen Exam: Awake and alert with clear speech.   Neck: Supple, No JVD.   Chest: B/L Clear, unlabored.  CVS: RRR, S1 S2 Regular, no murmurs, gallops or rubs.  Abdomen: soft, BS +, tender to palpation in LLQ, non distended.  Extremities: no edema, lower extremities warm to touch. Neurologic: Non Focal.   Skin: No Rash.    Assessment/Plan: Acute Hepatitis -Suspect secondary to medications-PCP recently discontinued pravastatin, recently was on augmentin  -Tylenol level, salicylate level was negative,Acute hepatitis panel negative -ANA and antimitochondrial antibody pending -Right upper quadrant ultrasound demonstrated: "Common bile duct measures 7 mm.  Status post cholecystectomy."  -GI consulted; recommends continuing zofran for nausea, consider EGD if nutritional status worsens.   LLQ pain Suspect secondary to constipation-CT Abd shows large stool burden Enema given-start Miralax and Senokot Advance diet  Elevated B12 Likely due to liver disease,  monitor.   Hypertension  Continue metoprolol and Norvasc, Cozaar, metoprolol   Nausea  Suspect secondary to abnormal liver function, also has a history of hiatal hernia repair  Continue PPI , advance diet  Asthma  Continue dulera  Disposition: Remain inpatient  DVT Prophylaxis: Prophylactic Lovenox   Code Status: Full code   Family Communication No family present at this time  Procedures:  None at this time  CONSULTS:  GI  Time spent 40 minutes-which includes 50% of the time with face-to-face with patient/ family and coordinating care related to the above assessment and plan.  MEDICATIONS: Scheduled Meds: . amLODipine  10 mg Oral Daily  . enoxaparin (LOVENOX) injection  40 mg Subcutaneous Q24H  . gabapentin  300 mg Oral TID  . losartan  100 mg Oral Daily  . metoprolol tartrate  12.5 mg Oral QHS  . mometasone-formoterol  2 puff Inhalation BID  . sodium chloride  3 mL Intravenous Q12H  . sodium phosphate  1 enema Rectal Once   Continuous Infusions: . sodium chloride 75 mL/hr at 12/04/13 0722   PRN Meds:.ALPRAZolam, guaiFENesin-dextromethorphan, hydrALAZINE, HYDROmorphone (DILAUDID) injection, levalbuterol, ondansetron (ZOFRAN) IV, ondansetron, traMADol  LAB RESULTS: CBC  Recent Labs Lab 12/03/13 1040 12/03/13 2030 12/04/13 0014  WBC 5.8 7.5 6.3  HGB 15.5* 14.4 13.6  HCT 44.4 44.2 42.0  PLT 343 372 330  MCV 89.3 89.8 91.5  MCH 31.2 29.3 29.6  MCHC 34.9 32.6 32.4  RDW 15.4 15.4 15.6*  LYMPHSABS 1.1  --   --   MONOABS 0.5  --   --  EOSABS 0.2  --   --   BASOSABS 0  --   --     Chemistries   Recent Labs Lab 12/03/13 1040 12/03/13 2030 12/04/13 0014  NA 142  --  138  K 4.6  --  4.1  CL 101  --  100  CO2 26  --  21  GLUCOSE 111*  --  89  BUN 12  --  10  CREATININE 0.78 0.79 0.67  CALCIUM 9.9  --  9.1    GFR The CrCl is unknown because both a height and weight (above a minimum accepted value) are required for this  calculation.  Coagulation profile  Recent Labs Lab 12/03/13 2030  INR 0.97   Cardiac Enzymes  Recent Labs Lab 12/03/13 1040 12/03/13 2030 12/04/13 0014  TROPONINI <0.30 <0.30 <0.30    Recent Labs  12/03/13 2030  HGBA1C 5.9*   Recent Labs  12/03/13 1635  VITAMINB12 1658*  FOLATE >20.0  FERRITIN 33  TIBC 382  IRON 118  RETICCTPCT 0.8    Recent Labs  12/03/13 1040  LIPASE 9*    RADIOLOGY STUDIES/RESULTS: Dg Thoracic Spine 2 View 12/03/2013   CLINICAL DATA:  Lower thoracic and lumbar pain with history of kyphoplasty  EXAM: THORACIC SPINE - 2 VIEW  COMPARISON:  PA and lateral chest x-ray dated November 20, 2013  FINDINGS: The thoracic vertebral bodies are preserved in height. There is compression of the body of L1. The thoracic disc space heights are well maintained. The pedicles appear intact. There are no abnormal paravertebral soft tissue densities.  IMPRESSION: There is no acute compression of the thoracic vertebral bodies.   Electronically Signed   By: David  [place]   On: 12/03/2013 12:43   Dg Lumbar Spine Complete 12/03/2013   CLINICAL DATA:  Low back pain with history of kyphoplasty  EXAM: LUMBAR SPINE - COMPLETE 4+ VIEW  COMPARISON:  Lumbar spine MRI dated November 21, 2013.  FINDINGS: There is wedge compression of the body of L1 with loss of height anteriorly of approximately 70%. This is stable. There is stable partial compressions of L3 and L4 treated with kyphoplasty. L2 and L5 are intact. There is facet joint hypertrophy at L4-5 and L5-S1. There is grade 1 anterolisthesis of L4 with respect to L5 which is stable.  IMPRESSION: There are old compressions of the bodies of L1, L3, and L4. There are degenerative facet joint changes at L4-5 and L5-S1. There is no acute lumbar spine abnormality.   Electronically Signed   By: David  [place]   On: 12/03/2013 12:46   Ct Abdomen Pelvis W Contrast 12/03/2013   CLINICAL DATA:  Scan.  Back pain.  Abnormal LFTs.  EXAM: CT ABDOMEN AND  PELVIS WITH CONTRAST  TECHNIQUE: Multidetector CT imaging of the abdomen and pelvis was performed using the standard protocol following bolus administration of intravenous contrast.  CONTRAST:  4mL OMNIPAQUE IOHEXOL 300 MG/ML  SOLN  COMPARISON:  Abdomen series 7/28 /2015.  CT 09/27/2013.  FINDINGS: No focal hepatic abnormality. Punctate stable lucency in the medial portion of the liver is unchanged consistent stable 4 mm cyst. Spleen is normal. Pancreas is normal. No biliary distention.  Adrenals normal. Punctate cyst anterior aspect midportion left kidney. This is unchanged. Kidneys are otherwise normal. No hydronephrosis or obstructing ureteral stone. The bladder is nondistended. Hysterectomy. No free pelvic fluid.  No significant inguinal or retroperitoneal adenopathy. Abdominal aorta normal in caliber. Visceral vessels are patent. Renal atherosclerotic vascular disease.  No  inflammatory change in right or left lower quadrant. Large amount of stool is present colon. No bowel distention. No free air. No mesenteric mass. No evidence of hernia. Stable changes of prior hiatal hernia surgery. If further evaluation is needed of the surgical site of the hiatal hernia repair, upper GI can be performed. Stomach is nondistended.  Lung bases are clear. Heart size normal. Multiple lumbar compression fractures with postsurgical change from prior vertebroplasty. Methylmethacrylate noted in the L2-3 disc space.  IMPRESSION: 1. No acute abnormality. 2. Postsurgical changes of a hiatal hernial repair, stable from prior CT of 09/27/2013. No bowel distention. No gastric distention. 3. Multiple lumbar compression fractures with postsurgical change, stable.   Electronically Signed   By: Marcello Moores  Register   On: 12/03/2013 15:28   Dg Abd Acute W/chest 12/03/2013   CLINICAL DATA:  Nausea  EXAM: ACUTE ABDOMEN SERIES (ABDOMEN 2 VIEW & CHEST 1 VIEW)  COMPARISON:  PA and lateral chest x-ray of November 20, 2013  FINDINGS: The lungs are mildly  hyperinflated but clear. There is stable scarring just above the left lateral costophrenic angle. The heart and mediastinal structures are normal. The bony thorax exhibits no acute abnormality.  Within the abdomen there is a moderate stool burden throughout the colon. There is no ileus nor obstruction. There are surgical clips in the gallbladder fossa and in the region of the GE junction. The patient has undergone kyphoplasty at L3 and L4 in there is compression of the body of L1.  IMPRESSION: 1. COPD without evidence of acute cardiopulmonary abnormality. 2. The bowel gas pattern is nonspecific. There is no evidence of obstruction or perforation. There may be mild clinical constipation.   Electronically Signed   By: David  [place]   On: 12/03/2013 12:49   US Abdomen Limited Ruq 12/03/2013   CLINICAL DATA:  Increased LFTs.  Abdominal pain.  Nausea.  EXAM: US ABDOMEN LIMITED - RIGHT UPPER QUADRANT  COMPARISON:  CT 12/03/2013  FINDINGS: Gallbladder:  Surgically absent  Common bile duct:  Diameter: 7 mm  Liver:  Within normal limits in parenchymal echogenicity. Sub cm hypoechoic lesion within the hepatic parenchyma, right hepatic lobe, too small to characterize.  IMPRESSION: Common bile duct measures 7 mm.  Status post cholecystectomy.   Electronically Signed   By: Lovey Newcomer M.D.   On: 12/03/2013 19:50    Audree Bane  [facility] Pager:[phone]  If 7PM-7AM, please contact night-coverage [email or web address] Password [email or web address] 12/04/2013, 10:52 AM   LOS: 1 day   **Disclaimer: This note may have been dictated with voice recognition software. Similar sounding words can inadvertently be transcribed and this note may contain transcription errors which may not have been corrected upon publication of note.**  Attending -Patient was seen, examined,treatment plan was discussed with the Physician extender. I have directly reviewed the clinical findings, lab, imaging studies and management of this  patient in detail. I have made the necessary changes to the above noted documentation, and agree with the documentation, as recorded by the Physician extender.  Nena Alexander MD [facility].

## 2013-12-05 DIAGNOSIS — E43 Unspecified severe protein-calorie malnutrition: Secondary | ICD-10-CM

## 2013-12-05 LAB — COMPREHENSIVE METABOLIC PANEL
ALBUMIN: 3.3 g/dL — AB (ref 3.5–5.2)
ALK PHOS: 185 U/L — AB (ref 39–117)
ALT: 227 U/L — ABNORMAL HIGH (ref 0–35)
AST: 68 U/L — AB (ref 0–37)
Anion gap: 9 (ref 5–15)
BUN: 7 mg/dL (ref 6–23)
CO2: 27 meq/L (ref 19–32)
Calcium: 9.4 mg/dL (ref 8.4–10.5)
Chloride: 106 mEq/L (ref 96–112)
Creatinine, Ser: 0.74 mg/dL (ref 0.50–1.10)
GFR calc Af Amer: >90 mL/min (ref >90)
GFR, EST NON AFRICAN AMERICAN: 81 mL/min — AB (ref >90)
Glucose, Bld: 89 mg/dL (ref 70–99)
POTASSIUM: 4.3 meq/L (ref 3.7–5.3)
Sodium: 142 mEq/L (ref 137–147)
Total Bilirubin: 0.7 mg/dL (ref 0.3–1.2)
Total Protein: 6.3 g/dL (ref 6.0–8.3)

## 2013-12-05 LAB — ANTI-SMOOTH MUSCLE ANTIBODY, IGG: F-Actin IgG: 27 U — ABNORMAL HIGH (ref <20)

## 2013-12-05 MED ORDER — GLUCERNA SHAKE PO LIQD: 237.0000 mL | Freq: Three times a day (TID) | ORAL | Status: DC

## 2013-12-05 MED ORDER — METOCLOPRAMIDE HCL 10 MG PO TABS: 5.0000 mg | ORAL_TABLET | Freq: Three times a day (TID) | ORAL | Status: DC

## 2013-12-05 MED ORDER — METOCLOPRAMIDE HCL 5 MG PO TABS
5.0000 mg | ORAL_TABLET | Freq: Three times a day (TID) | ORAL | Status: DC
Start: 2013-12-05 — End: 2013-12-05
  Administered 2013-12-05 (×3): 5 mg via ORAL
  Filled 2013-12-05 (×4): qty 1

## 2013-12-05 MED ORDER — METOCLOPRAMIDE HCL 5 MG PO TABS: 5.0000 mg | ORAL_TABLET | Freq: Three times a day (TID) | ORAL | Status: DC

## 2013-12-05 MED ORDER — SENNA 8.6 MG PO TABS
2.0000 | ORAL_TABLET | Freq: Every day | ORAL | Status: DC
Start: 2013-12-05 — End: 2013-12-21

## 2013-12-05 NOTE — Progress Notes (Signed)
NURSING PROGRESS NOTE  Velinda Wrobel [id number] Discharge Data: 12/05/2013 6:09 PM Attending Provider: Jonetta Osgood, MD DQQ:[id number],QJJHERD DAVID, MD     Ezekiel Ina to be D/C'd Home per MD order.  Discussed with the patient and husband the After Visit Summary and all questions fully answered. All IV's discontinued with no bleeding noted. All belongings returned to patient for patient to take home.   Last Vital Signs:  Blood pressure 113/67, pulse 71, temperature 98.7 F (37.1 C), temperature source Oral, resp. rate 18, height 5\' 5"  (1.651 m), weight 58.786 kg (129 lb 9.6 oz), SpO2 98.00%.  Discharge Medication List   Medication List         ALPRAZolam 0.5 MG tablet  Commonly known as:  XANAX  Take 1 tablet (0.5 mg total) by mouth 3 (three) times daily as needed for sleep or anxiety.     amLODipine 10 MG tablet  Commonly known as:  NORVASC  Take 10 mg by mouth daily. For blood pressure     feeding supplement (GLUCERNA SHAKE) Liqd  Take 237 mLs by mouth 3 (three) times daily between meals.     gabapentin 300 MG capsule  Commonly known as:  NEURONTIN  Take 1 capsule (300 mg total) by mouth 3 (three) times daily.     losartan 100 MG tablet  Commonly known as:  COZAAR  Take 100 mg by mouth daily.     metoCLOPramide 10 MG tablet  Commonly known as:  REGLAN  Take 0.5 tablets (5 mg total) by mouth 3 (three) times daily before meals.     metoprolol tartrate 25 MG tablet  Commonly known as:  LOPRESSOR  Take 12.5 mg by mouth at bedtime.     mometasone-formoterol 200-5 MCG/ACT Aero  Commonly known as:  DULERA  Inhale 2 puffs into the lungs 2 (two) times daily.     ondansetron 8 MG disintegrating tablet  Commonly known as:  ZOFRAN ODT  Take 1 tablet (8 mg total) by mouth every 8 (eight) hours as needed for nausea or vomiting. For nausea     OVER THE COUNTER MEDICATION  Take 1,000 mg by mouth daily. [place] Calcium 1000mg      OVER THE COUNTER MEDICATION  Take 400 mg by  mouth 3 (three) times daily. Magnesium Carbonate 400mg      polyethylene glycol packet  Commonly known as:  MIRALAX / GLYCOLAX  Take 17 g by mouth 2 (two) times daily.     pravastatin 40 MG tablet  Commonly known as:  PRAVACHOL  Take 40 mg by mouth daily.     senna 8.6 MG Tabs tablet  Commonly known as:  SENOKOT  Take 2 tablets (17.2 mg total) by mouth at bedtime.     traMADol 50 MG tablet  Commonly known as:  ULTRAM  Take 50 mg by mouth every 6 (six) hours as needed for moderate pain or severe pain.     TUDORZA PRESSAIR 400 MCG/ACT Aepb  Generic drug:  Aclidinium Bromide  Inhale 1 puff into the lungs 2 (two) times daily as needed (wheezing).     Vitamin D3 2000 UNITS Tabs  Take 6,000 Units by mouth daily.         Wallie Renshaw, RN

## 2013-12-05 NOTE — Care Management Note (Signed)
    Page 1 of 1   12/05/2013     10:40:12 AM CARE MANAGEMENT NOTE 12/05/2013  Patient:  [name] [name]   Account Number:  [id number]  Date Initiated:  12/05/2013  Documentation initiated by:  Tomi Bamberger  Subjective/Objective Assessment:   dx abnl liver function, back pain  admit- lives with spouse.     Action/Plan:   Anticipated DC Date:  12/05/2013   Anticipated DC Plan:  HOME/SELF CARE      DC Planning Services  CM consult      Choice offered to / List presented to:             Status of service:  Completed, signed off Medicare Important Message given?  NA - LOS <3 / Initial given by admissions (If response is "NO", the following Medicare IM given date fields will be blank) Date Medicare IM given:   Medicare IM given by:   Date Additional Medicare IM given:   Additional Medicare IM given by:    Discharge Disposition:  HOME/SELF CARE  Per UR Regulation:  Reviewed for med. necessity/level of care/duration of stay  If discussed at [place] of Stay Meetings, dates discussed:    Comments:  12/05/13 [place], BSN [phone] patient lives with spouse, no needs anticipated.  If patient tolerates lunch today will dc to home today.

## 2013-12-05 NOTE — Discharge Summary (Addendum)
PATIENT DETAILS Name: [name] [name] Age: 78 y.o. Sex: female Date of Birth: [birth year] MRN: [id number]. Admit Date: 12/03/2013 Admitting Physician: Reyne Dumas, MD ALP:[id number] DAVID, MD  Recommendations for Outpatient Follow-up:  1. Please repeat LFTs at next visit 2. Antimitochondrial antibodies pending at the time of discharge, please follow.  PRIMARY DISCHARGE DIAGNOSIS:  Active Problems:   Abnormal transaminases   Transaminitis   Protein-calorie malnutrition, severe      PAST MEDICAL HISTORY: Past Medical History  Diagnosis Date  . CAD (coronary artery disease)     LHC 2003 with 40% pLAD, 30% mLAD, 100% D1 (moderate vessel), 100%  D2 (small vessel), 95% mid small OM2.  Pt had PTCA to D1;  D2 and OM2 small vessels and tx medically;  Last Myoview (2012): anterior infarct seen with scar, no ischemia. EF normal. Pt managed medically;  Lexiscan Myoview (12/14):  Low risk; ant scar with very mild peri-infarct ischemia; EF 55% with ant AK   . Recurrent aspiration bronchitis/pneumonia     PFTs 12/09 FVC 97%, FEV1 101%, ratio 77%, TLC 109%. No significant obstruction or restriction.   . Hypertension   . Hyperlipidemia   . Esophageal stricture   . Gastritis   . GERD (gastroesophageal reflux disease)   . Asthma   . Blood transfusion   . Cough   . Wheezing   . Weakness   . Trouble swallowing   . Abdominal pain   . Hiatal hernia   . Shortness of breath   . Anxiety   . TIA (transient ischemic attack)   . BRONCHITIS, ACUTE 07/25/2007         . DIVERTICULOSIS, COLON 08/28/2008    Qualifier: Diagnosis of  By: [name], [name], [name]    . IRON DEFICIENCY ANEMIA, HX OF 07/24/2007    Qualifier: Diagnosis of  By: Tilden Dome    . Chronic nausea   . Chronic back pain   . PONV (postoperative nausea and vomiting)     DISCHARGE MEDICATIONS:   Medication List         ALPRAZolam 0.5 MG tablet  Commonly known as:  XANAX  Take 1 tablet (0.5 mg total) by mouth 3 (three)  times daily as needed for sleep or anxiety.     amLODipine 10 MG tablet  Commonly known as:  NORVASC  Take 10 mg by mouth daily. For blood pressure     feeding supplement (GLUCERNA SHAKE) Liqd  Take 237 mLs by mouth 3 (three) times daily between meals.     gabapentin 300 MG capsule  Commonly known as:  NEURONTIN  Take 1 capsule (300 mg total) by mouth 3 (three) times daily.     losartan 100 MG tablet  Commonly known as:  COZAAR  Take 100 mg by mouth daily.     metoCLOPramide 10 MG tablet  Commonly known as:  REGLAN  Take 0.5 tablets (5 mg total) by mouth 3 (three) times daily before meals.     metoprolol tartrate 25 MG tablet  Commonly known as:  LOPRESSOR  Take 12.5 mg by mouth at bedtime.     mometasone-formoterol 200-5 MCG/ACT Aero  Commonly known as:  DULERA  Inhale 2 puffs into the lungs 2 (two) times daily.     ondansetron 8 MG disintegrating tablet  Commonly known as:  ZOFRAN ODT  Take 1 tablet (8 mg total) by mouth every 8 (eight) hours as needed for nausea or vomiting. For nausea     OVER THE COUNTER MEDICATION  Take 1,000 mg by mouth daily. Coral Calcium 1000mg      OVER THE COUNTER MEDICATION  Take 400 mg by mouth 3 (three) times daily. Magnesium Carbonate 400mg      polyethylene glycol packet  Commonly known as:  MIRALAX / GLYCOLAX  Take 17 g by mouth 2 (two) times daily.     pravastatin 40 MG tablet  Commonly known as:  PRAVACHOL  Take 40 mg by mouth daily.     senna 8.6 MG Tabs tablet  Commonly known as:  SENOKOT  Take 2 tablets (17.2 mg total) by mouth at bedtime.     traMADol 50 MG tablet  Commonly known as:  ULTRAM  Take 50 mg by mouth every 6 (six) hours as needed for moderate pain or severe pain.     TUDORZA PRESSAIR 400 MCG/ACT Aepb  Generic drug:  Aclidinium Bromide  Inhale 1 puff into the lungs 2 (two) times daily as needed (wheezing).     Vitamin D3 2000 UNITS Tabs  Take 6,000 Units by mouth daily.        ALLERGIES:   Allergies    Allergen Reactions  . Aspartame Diarrhea and Nausea And Vomiting  . Daliresp [Roflumilast] Nausea And Vomiting  . Hydrocodone Nausea Only  . Levofloxacin Nausea And Vomiting  . Tramadol Other (See Comments)    Blurred vision   . Ace Inhibitors Cough    Per Dr Melvyn Novas pulmonology 2013    BRIEF HPI:  See H&P, Labs, Consult and Test reports for all details in brief, patient was admitted for evaluation of worsening nausea, vomiting and abdominal pain. She was found to have significantly elevated and her enzymes on admission. She was admitted for evaluation and treatment.  CONSULTATIONS:   GI  PERTINENT RADIOLOGIC STUDIES: Dg Chest 2 View  11/20/2013   CLINICAL DATA:  Shortness of breath.  EXAM: CHEST  2 VIEW  COMPARISON:  10/22/2013.  FINDINGS: Bilateral hyperexpansion suggests emphysema. No focal airspace consolidation or pulmonary edema. No pleural effusion. Scarring at the left costophrenic angle is stable. The cardiopericardial silhouette is within normal limits for size. Bones are diffusely demineralized compression deformity at T12 and L1 is stable.  IMPRESSION: Emphysema without acute cardiopulmonary findings.   Electronically Signed   By: Misty Stanley M.D.   On: 11/20/2013 18:37   Dg Thoracic Spine 2 View  12/03/2013   CLINICAL DATA:  Lower thoracic and lumbar pain with history of kyphoplasty  EXAM: THORACIC SPINE - 2 VIEW  COMPARISON:  PA and lateral chest x-ray dated November 20, 2013  FINDINGS: The thoracic vertebral bodies are preserved in height. There is compression of the body of L1. The thoracic disc space heights are well maintained. The pedicles appear intact. There are no abnormal paravertebral soft tissue densities.  IMPRESSION: There is no acute compression of the thoracic vertebral bodies.   Electronically Signed   By: David  Martinique   On: 12/03/2013 12:43   Dg Lumbar Spine Complete  12/03/2013   CLINICAL DATA:  Low back pain with history of kyphoplasty  EXAM: LUMBAR SPINE -  COMPLETE 4+ VIEW  COMPARISON:  Lumbar spine MRI dated November 21, 2013.  FINDINGS: There is wedge compression of the body of L1 with loss of height anteriorly of approximately 70%. This is stable. There is stable partial compressions of L3 and L4 treated with kyphoplasty. L2 and L5 are intact. There is facet joint hypertrophy at L4-5 and L5-S1. There is grade 1 anterolisthesis of L4 with respect to  L5 which is stable.  IMPRESSION: There are old compressions of the bodies of L1, L3, and L4. There are degenerative facet joint changes at L4-5 and L5-S1. There is no acute lumbar spine abnormality.   Electronically Signed   By: David  Martinique   On: 12/03/2013 12:46   Ct Angio Chest Pe W/cm &/or Wo Cm  11/20/2013   CLINICAL DATA:  Pain and shortness of Breath  EXAM: CT ANGIOGRAPHY CHEST WITH CONTRAST  TECHNIQUE: Multidetector CT imaging of the chest was performed using the standard protocol during bolus administration of intravenous contrast. Multiplanar CT image reconstructions and MIPs were obtained to evaluate the vascular anatomy.  CONTRAST:  150mL OMNIPAQUE IOHEXOL 350 MG/ML SOLN  COMPARISON:  Chest radiograph November 20, 2013 and chest CT May 13, 2013  FINDINGS: There is no demonstrable pulmonary embolus. There is atherosclerotic change in the aorta but no demonstrable thoracic aortic aneurysm or dissection.  There is mild bibasilar lung scarring. There is no lung edema or consolidation.  There is extensive coronary artery calcification. Pericardium is not thickened. There is no appreciable thoracic adenopathy.  There is a small hiatal hernia. The previously noted periesophageal fluid distally has resolved.  There is postoperative change at the gastroesophageal junction. There is fatty liver. Gallbladder is absent. The common hepatic duct is incompletely visualized but does appear dilated. There is degenerative change in the lower cervical and thoracic spine regions. No blastic or lytic bone lesions are identified.  Thyroid appears unremarkable.  Review of the MIP images confirms the above findings.  IMPRESSION: No demonstrable pulmonary embolus.  No lung edema or consolidation.  Extensive coronary artery calcification present.  Postoperative change at the gastroesophageal junction. There is a small hiatal hernia apparent.  There is fatty liver. Visualized extrahepatic biliary ductal system appears enlarged. This area is incompletely visualized.   Electronically Signed   By: Lowella Grip M.D.   On: 11/20/2013 21:45   Mr Lumbar Spine Wo Contrast  11/21/2013   CLINICAL DATA:  Severe back pain. Recent L3 and L4 vertebral augmentation. Persistent with back pain extending into left hip.  EXAM: MRI LUMBAR SPINE WITHOUT CONTRAST  TECHNIQUE: Multiplanar, multisequence MR imaging of the lumbar spine was performed. No intravenous contrast was administered.  COMPARISON:  CT abdomen and pelvis 09/27/2013. Lumbar spine MRI 06/18/2013.  FINDINGS: Slight retrolisthesis of L1 on L2 and grade 1 anterolisthesis of L4 on L5 are unchanged. T12 superior endplate mild compression deformity/Schmorl's node is unchanged. Chronic, severe L1 compression fracture is unchanged from prior MRI. Severe L3 compression fracture is again seen status post interval vertebral augmentation. Mild L4 compression fracture is again seen, also now status post vertebral augmentation. There is no evidence of new compression fracture in the lumbar spine or visualized thoracic spine (imaged from T8 to the sacrum). There is mild lumbar levoscoliosis. There is mild marrow edema extending from the left T12 inferior facet to the base of the spinous process, unchanged. The distal spinal cord is normal in signal. Conus medullaris terminates at L1. Paraspinal soft tissues are unremarkable.  T12-L1: Mild retropulsion of the L1 superior endplate without stenosis.  L1-2: Mild disc bulge and facet hypertrophy result in mild bilateral neural foraminal narrowing without spinal  canal stenosis, unchanged.  L2-3: Mild spinal canal and facet and ligamentum flavum hypertrophy result in mild spinal stenosis and mild bilateral neural foraminal stenosis, unchanged.  L3-4: Mild disc bulge and mild-to-moderate facet and ligamentum flavum hypertrophy result in mild spinal stenosis and mild bilateral neural  foraminal stenosis, unchanged.  L4-5: Prior right laminectomy again noted. Listhesis with uncovering of the disc and moderate facet hypertrophy result in mild bilateral lateral recess narrowing and mild bilateral neural foraminal narrowing, unchanged.  L5-S1: Prior bilateral laminectomies. Endplate spurring and mild facet hypertrophy result in mild left greater than right neural foraminal narrowing, unchanged. No spinal canal stenosis.  IMPRESSION: 1. Unchanged, chronic L1 compression fracture and unchanged height loss of severe L3 and mild L4 compression fractures status post augmentation. No new compression fracture identified. 2. Unchanged, moderate multilevel disc degeneration and facet arthrosis in the lumbar spine resulting in mild spinal stenosis at L2-3 and L3-4 and mild lateral recess narrowing at L4-5.   Electronically Signed   By: Logan Bores   On: 11/21/2013 17:42   Ct Abdomen Pelvis W Contrast  12/03/2013   CLINICAL DATA:  Scan.  Back pain.  Abnormal LFTs.  EXAM: CT ABDOMEN AND PELVIS WITH CONTRAST  TECHNIQUE: Multidetector CT imaging of the abdomen and pelvis was performed using the standard protocol following bolus administration of intravenous contrast.  CONTRAST:  39mL OMNIPAQUE IOHEXOL 300 MG/ML  SOLN  COMPARISON:  Abdomen series 7/28 /2015.  CT 09/27/2013.  FINDINGS: No focal hepatic abnormality. Punctate stable lucency in the medial portion of the liver is unchanged consistent stable 4 mm cyst. Spleen is normal. Pancreas is normal. No biliary distention.  Adrenals normal. Punctate cyst anterior aspect midportion left kidney. This is unchanged. Kidneys are otherwise normal.  No hydronephrosis or obstructing ureteral stone. The bladder is nondistended. Hysterectomy. No free pelvic fluid.  No significant inguinal or retroperitoneal adenopathy. Abdominal aorta normal in caliber. Visceral vessels are patent. Renal atherosclerotic vascular disease.  No inflammatory change in right or left lower quadrant. Large amount of stool is present colon. No bowel distention. No free air. No mesenteric mass. No evidence of hernia. Stable changes of prior hiatal hernia surgery. If further evaluation is needed of the surgical site of the hiatal hernia repair, upper GI can be performed. Stomach is nondistended.  Lung bases are clear. Heart size normal. Multiple lumbar compression fractures with postsurgical change from prior vertebroplasty. Methylmethacrylate noted in the L2-3 disc space.  IMPRESSION: 1. No acute abnormality. 2. Postsurgical changes of a hiatal hernial repair, stable from prior CT of 09/27/2013. No bowel distention. No gastric distention. 3. Multiple lumbar compression fractures with postsurgical change, stable.   Electronically Signed   By: Marcello Moores  Register   On: 12/03/2013 15:28   Dg Abd Acute W/chest  12/03/2013   CLINICAL DATA:  Nausea  EXAM: ACUTE ABDOMEN SERIES (ABDOMEN 2 VIEW & CHEST 1 VIEW)  COMPARISON:  PA and lateral chest x-ray of November 20, 2013  FINDINGS: The lungs are mildly hyperinflated but clear. There is stable scarring just above the left lateral costophrenic angle. The heart and mediastinal structures are normal. The bony thorax exhibits no acute abnormality.  Within the abdomen there is a moderate stool burden throughout the colon. There is no ileus nor obstruction. There are surgical clips in the gallbladder fossa and in the region of the GE junction. The patient has undergone kyphoplasty at L3 and L4 in there is compression of the body of L1.  IMPRESSION: 1. COPD without evidence of acute cardiopulmonary abnormality. 2. The bowel gas pattern is nonspecific. There is  no evidence of obstruction or perforation. There may be mild clinical constipation.   Electronically Signed   By: David  Martinique   On: 12/03/2013 12:49   Ir Radiologist Eval &  Mgmt  11/22/2013   EXAM: ESTABLISHED PATIENT OFFICE VISIT  CHIEF COMPLAINT: Worsening of low back pain.  Current Pain Level: 1-10  HISTORY OF PRESENT ILLNESS: The patient is a 78 year old lady who is status post vertebral body augmentation at L3-L4 two weeks ago without any complications. Patient is accompanied by her husband. Reportedly she had modest relief after the procedures to where her pain was 3 out of 10 to 4 out of 10. She reports she only required tramadol on an intermittent basis.  The patient was moving much better than before. Her appetite had returned to near normal.  However yesterday after a bout of severe coughing, the patient experienced a sudden pain in the lower thoracic upper lumbar regions necessitating a visit to the ER.  She underwent a CT angiogram and CT of her chest.  At the present time the patient reports her pain to be a 9 out of 10 minimally relieved with tramadol. The pain apparently is exacerbated by coughing, sneezing or turning. She denies any radiation of pain into her upper extremities or lower extremities. She denies any autonomic dysfunction of her bowel or bladder. She denies any UTI symptoms of dysuria, frequency of micturition or hematuria. She denies any recent chills or fever or rigors.  Her appetite is significantly decreased since yesterday. However she is still able to mobilize with modest difficulty using a walker.  The remainder of her history remains unchanged.  Medications: Xanax. Amlodipine. Gabapentin. Losartan. Reglan. Metoprolol. Dulera. MiraLax. Pravachol. Phenergan. Tramadol. Tudorza inhalers for wheezing as needed.  Allergies: Aspartame, causes nausea/vomiting. Daliresp, nausea/vomiting. Hydrocodone, nausea only. Levofloxacin, nausea/vomiting. Oxycodone nausea only. Ace inhibitors,  cough.  PHYSICAL EXAMINATION: On brief examination, the patient appears to be in significant distress on account of her pain. She is almost teary eyed. Otherwise, she remains awake, alert, oriented to time, place, space. Questions are answered appropriately. No gross neurological findings.  On palpation there is exquisite tenderness above the previously treated L3 vertebral body.  ASSESSMENT AND PLAN: Given the above clinical findings, the primary consideration for patient's deterioration most probably represents a new fracture. An MRI of the lower thoracic and lumbar spine will be obtained. In the meantime, the patient has been asked to continue with previous precautionary measures. She has been also advised to use a heating pad for relief. Both the husband and the patient are in agreement to proceed with an MRI of the thoracolumbar spine. Further recommendations to follow findings on the MRI scan.   Electronically Signed   By: Luanne Bras M.D.   On: 11/21/2013 13:45   US Abdomen Limited Ruq  12/03/2013   CLINICAL DATA:  Increased LFTs.  Abdominal pain.  Nausea.  EXAM: US ABDOMEN LIMITED - RIGHT UPPER QUADRANT  COMPARISON:  CT 12/03/2013  FINDINGS: Gallbladder:  Surgically absent  Common bile duct:  Diameter: 7 mm  Liver:  Within normal limits in parenchymal echogenicity. Sub cm hypoechoic lesion within the hepatic parenchyma, right hepatic lobe, too small to characterize.  IMPRESSION: Common bile duct measures 7 mm.  Status post cholecystectomy.   Electronically Signed   By: Lovey Newcomer M.D.   On: 12/03/2013 19:50     PERTINENT LAB RESULTS: CBC:  Recent Labs  12/03/13 2030 12/04/13 0014  WBC 7.5 6.3  HGB 14.4 13.6  HCT 44.2 42.0  PLT 372 330   CMET CMP     Component Value Date/Time   NA 142 12/05/2013 0610   NA 139 02/19/2013 1346   K  4.3 12/05/2013 0610   K 2.8* 02/19/2013 1346   CL 106 12/05/2013 0610   CO2 27 12/05/2013 0610   CO2 24 02/19/2013 1346   GLUCOSE 89 12/05/2013 0610     GLUCOSE 100 02/19/2013 1346   BUN 7 12/05/2013 0610   BUN 12.6 02/19/2013 1346   CREATININE 0.74 12/05/2013 0610   CREATININE 0.78 11/14/2013 1031   CREATININE 1.0 02/19/2013 1346   CALCIUM 9.4 12/05/2013 0610   CALCIUM 10.0 02/19/2013 1346   PROT 6.3 12/05/2013 0610   PROT 8.0 02/19/2013 1346   ALBUMIN 3.3* 12/05/2013 0610   ALBUMIN 3.9 02/19/2013 1346   AST 68* 12/05/2013 0610   AST 12 02/19/2013 1346   ALT 227* 12/05/2013 0610   ALT 11 02/19/2013 1346   ALKPHOS 185* 12/05/2013 0610   ALKPHOS 87 02/19/2013 1346   BILITOT 0.7 12/05/2013 0610   BILITOT 0.74 02/19/2013 1346   GFRNONAA 81* 12/05/2013 0610   GFRNONAA 74 11/14/2013 1031   GFRAA >90 12/05/2013 0610   GFRAA 85 11/14/2013 1031    GFR Estimated Creatinine Clearance: 53.8 ml/min (by C-G formula based on Cr of 0.74).  Recent Labs  12/03/13 1040  LIPASE 9*    Recent Labs  12/03/13 1040 12/03/13 1635 12/03/13 2030 12/04/13 0014  CKTOTAL  --  36  --   --   TROPONINI <0.30  --  <0.30 <0.30   No components found with this basename: POCBNP,  No results found for this basename: DDIMER,  in the last 72 hours  Recent Labs  12/03/13 2030  HGBA1C 5.9*   No results found for this basename: CHOL, HDL, LDLCALC, TRIG, CHOLHDL, LDLDIRECT,  in the last 72 hours No results found for this basename: TSH, T4TOTAL, FREET3, T3FREE, THYROIDAB,  in the last 72 hours  Recent Labs  12/03/13 1635  VITAMINB12 1658*  FOLATE >20.0  FERRITIN 33  TIBC 382  IRON 118  RETICCTPCT 0.8   Coags:  Recent Labs  12/03/13 2030  INR 0.97   Microbiology: No results found for this or any previous visit (from the past 240 hour(s)).   BRIEF HOSPITAL COURSE:   Active Problems: Abnormal Transaminases  -Suspect secondary to medications-PCP recently discontinued pravastatin, recently was on augmentin  -LFTs trending down, continue to monitor as outpatient.  -Tylenol level, salicylate level was negative, Acute hepatitis panel negative  -ANA  negative,Antimitochondrial antibody pending-please follow -Right upper quadrant ultrasound was negative for any acute abnormalities.  - Seen by gastroenterology during this hospital stay, no further workup recommended at this time.  Nausea without vomiting  - This is a chronic issue for this patient, likely secondary to known fundoplication, acute hepatitis. - Supportive care with as needed antiemetics, started on scheduled Reglan, able to advanced diet at the time of discharge. - Seen by GI during this hospital stay, for further workup if needed in the outpatient setting.  LLQ pain  -Suspect secondary to constipation-CT Abd showed large stool burden - significantly better after enema treatment. Continue Miralax and Senokot as outpatient. Very minimal pain at the time of discharge, on exam hardly any tenderness at all.   Back Pain  - Chronic issue, at baseline.  Hypertension  -Controlled, continue metoprolol and Norvasc, Cozaar, metoprolol discharge.  Asthma  - Stable this admission without any evidence of flare. -Continue dulera on discharge  Severe Malnutrition -c/w supplements  TODAY-DAY OF DISCHARGE:  Subjective:   Genecis Veley today has no headache,no chest abdominal pain,no new weakness tingling or numbness, feels  much better wants to go home today.  Objective:   Blood pressure 113/67, pulse 71, temperature 98.7 F (37.1 C), temperature source Oral, resp. rate 18, height 5\' 5"  (1.651 m), weight 58.786 kg (129 lb 9.6 oz), SpO2 98.00%.  Intake/Output Summary (Last 24 hours) at 12/05/13 1409 Last data filed at 12/04/13 2300  Gross per 24 hour  Intake    225 ml  Output   1000 ml  Net   -775 ml   Filed Weights   12/04/13 1756  Weight: 58.786 kg (129 lb 9.6 oz)    Exam Awake Alert, Oriented *3, No new F.N deficits, Normal affect [place].AT,PERRAL Supple Neck,No JVD, No cervical lymphadenopathy appriciated.  Symmetrical Chest wall movement, Good air movement  bilaterally, CTAB RRR,No Gallops,Rubs or new Murmurs, No Parasternal Heave +ve B.Sounds, Abd Soft, Non tender, No organomegaly appriciated, No rebound -guarding or rigidity. No Cyanosis, Clubbing or edema, No new Rash or bruise  DISCHARGE CONDITION: Stable  DISPOSITION: Home  DISCHARGE INSTRUCTIONS:    Activity:  As tolerated   Diet recommendation: Heart Healthy diet      Discharge Instructions   Call MD for:  persistant nausea and vomiting    Complete by:  As directed      Call MD for:  severe uncontrolled pain    Complete by:  As directed      Diet - low sodium heart healthy    Complete by:  As directed      Increase activity slowly    Complete by:  As directed            Follow-up Information   Follow up with Alesia Richards, MD. Schedule an appointment as soon as possible for a visit in 1 week.   Specialty:  Internal Medicine   Contact information:   [address] [place] [address] [phone]       Follow up with Erskine Emery, MD. Schedule an appointment as soon as possible for a visit in 2 weeks.   Specialty:  Gastroenterology   Contact information:   [address] [place] [address] [phone]         Total Time spent on discharge equals 45 minutes.  SignedOren Binet 12/05/2013 2:09 PM  **Disclaimer: This note may have been dictated with voice recognition software. Similar sounding words can inadvertently be transcribed and this note may contain transcription errors which may not have been corrected upon publication of note.**

## 2013-12-05 NOTE — Progress Notes (Deleted)
PATIENT DETAILS Name: [name] [name] Age: 78 y.o. Sex: female Date of Birth: October 09, 1935 Admit Date: 12/03/2013 Admitting Physician Reyne Dumas, MD JAS:[id number] DAVID, MD  Subjective: Back pain has increased today; mostly tender on lower left side. LLQ abdominal pain still persists, though somewhat better than yesterday. Patient reports more loose stools today after having laxatives. Still has some nausea, no vomiting. Patient denies fever, chills, headache, dizziness, CP, SOB, dysuria.   PHYSICAL EXAM: Vital signs in last 24 hours: Filed Vitals:   12/04/13 2230 12/05/13 0427 12/05/13 0946 12/05/13 1004  BP: 133/68 110/63  106/62  Pulse: 72 60    Temp: 97.8 F (36.6 C) 98 F (36.7 C)    TempSrc: Oral Oral    Resp: 16 16    Height:      Weight:      SpO2: 98% 98% 96%    Weight change:  Filed Weights   12/04/13 1756  Weight: 58.786 kg (129 lb 9.6 oz)   Body mass index is 21.57 kg/(m^2).  Gen Exam: Awake and alert with clear speech.   Neck: Supple, No JVD.   Chest: B/L Clear, unlabored and even  CVS: S1 S2 Regular, no murmurs, gallops or rubs MSk: Tenderness to palpation of left side of lower back Abdomen: soft, BS +, diffusely tender with significant pain to palpation of LLQ, non distended.  Extremities: no edema, lower extremities warm to touch. Neurologic: Non Focal.  CN II-XII grossly intact  Skin: No Rash.    Assessment/Plan: Active Problems:   Abnormal transaminases   Transaminitis   Protein-calorie malnutrition, severe  Abnormal Transaminases -Suspect secondary to medications-PCP recently discontinued pravastatin, recently was on augmentin  -LFTs trending down, monitor  -Tylenol level, salicylate level was negative, Acute hepatitis panel negative  -ANA negative -Antimitochondrial antibody pending  -Right upper quadrant ultrasound demonstrated: "Common bile duct measures 7 mm. Status post cholecystectomy."  -GI consulted; recommends continuing  zofran for nausea and outpatient follow-up  LLQ pain  Suspect secondary to constipation-CT Abd showed large stool burden  Continue Miralax and Senokot  Advance diet   Back Pain Increase dose of pain medication for tighter control   Elevated B12  Likely due to liver disease, monitor.   Hypertension  Controlled, continue metoprolol and Norvasc, Cozaar, metoprolol   Nausea without vomiting Suspect secondary to abnormal liver function, also has a history of hiatal hernia repair  Continue PPI, zofran and reglan prn  Asthma  Continue dulera  Disposition: Remain inpatient  DVT Prophylaxis: Prophylactic Lovenox  Code Status: Full code  Family Communication Husband at bedside  Procedures:  None at this time  CONSULTS:  GI  Time spent 40 minutes-which includes 50% of the time with face-to-face with patient/ family and coordinating care related to the above assessment and plan.   MEDICATIONS: Scheduled Meds: . amLODipine  10 mg Oral Daily  . enoxaparin (LOVENOX) injection  40 mg Subcutaneous Q24H  . feeding supplement (GLUCERNA SHAKE)  237 mL Oral TID BM  . gabapentin  300 mg Oral TID  . losartan  100 mg Oral Daily  . metoCLOPramide  5 mg Oral TID AC  . metoprolol tartrate  12.5 mg Oral QHS  . mometasone-formoterol  2 puff Inhalation BID  . polyethylene glycol  17 g Oral BID  . senna  2 tablet Oral QHS  . sodium chloride  3 mL Intravenous Q12H   Continuous Infusions:  PRN Meds:.ALPRAZolam, guaiFENesin-dextromethorphan, hydrALAZINE, HYDROmorphone (DILAUDID) injection, levalbuterol, ondansetron (  ZOFRAN) IV, ondansetron, traMADol  Antibiotics: Anti-infectives   None      Intake/Output from previous day:  Intake/Output Summary (Last 24 hours) at 12/05/13 1301 Last data filed at 12/04/13 2300  Gross per 24 hour  Intake    447 ml  Output   1000 ml  Net   -553 ml    LAB RESULTS: CBC  Recent Labs Lab 12/03/13 1040 12/03/13 2030 12/04/13 0014  WBC  5.8 7.5 6.3  HGB 15.5* 14.4 13.6  HCT 44.4 44.2 42.0  PLT 343 372 330  MCV 89.3 89.8 91.5  MCH 31.2 29.3 29.6  MCHC 34.9 32.6 32.4  RDW 15.4 15.4 15.6*  LYMPHSABS 1.1  --   --   MONOABS 0.5  --   --   EOSABS 0.2  --   --   BASOSABS 0  --   --     Chemistries   Recent Labs Lab 12/03/13 1040 12/03/13 2030 12/04/13 0014 12/05/13 0610  NA 142  --  138 142  K 4.6  --  4.1 4.3  CL 101  --  100 106  CO2 26  --  21 27  GLUCOSE 111*  --  89 89  BUN 12  --  10 7  CREATININE 0.78 0.79 0.67 0.74  CALCIUM 9.9  --  9.1 9.4    GFR Estimated Creatinine Clearance: 53.8 ml/min (by C-G formula based on Cr of 0.74).  Coagulation profile  Recent Labs Lab 12/03/13 2030  INR 0.97    Cardiac Enzymes  Recent Labs Lab 12/03/13 1040 12/03/13 2030 12/04/13 0014  TROPONINI <0.30 <0.30 <0.30    Recent Labs  12/03/13 2030  HGBA1C 5.9*    Recent Labs  12/03/13 1635  VITAMINB12 1658*  FOLATE >20.0  FERRITIN 33  TIBC 382  IRON 118  RETICCTPCT 0.8    Recent Labs  12/03/13 1040  LIPASE 9*    RADIOLOGY STUDIES/RESULTS: Dg Chest 2 View 2013/12/15   CLINICAL DATA:  Shortness of breath.  EXAM: CHEST  2 VIEW  COMPARISON:  10/22/2013.  FINDINGS: Bilateral hyperexpansion suggests emphysema. No focal airspace consolidation or pulmonary edema. No pleural effusion. Scarring at the left costophrenic angle is stable. The cardiopericardial silhouette is within normal limits for size. Bones are diffusely demineralized compression deformity at T12 and L1 is stable.  IMPRESSION: Emphysema without acute cardiopulmonary findings.   Electronically Signed   By: Misty Stanley M.D.   On: Dec 15, 2013 18:37   Dg Thoracic Spine 2 View 12/03/2013   CLINICAL DATA:  Lower thoracic and lumbar pain with history of kyphoplasty  EXAM: THORACIC SPINE - 2 VIEW  COMPARISON:  PA and lateral chest x-ray dated 2013/12/15  FINDINGS: The thoracic vertebral bodies are preserved in height. There is compression of  the body of L1. The thoracic disc space heights are well maintained. The pedicles appear intact. There are no abnormal paravertebral soft tissue densities.  IMPRESSION: There is no acute compression of the thoracic vertebral bodies.   Electronically Signed   By: David  Martinique   On: 12/03/2013 12:43   Dg Lumbar Spine Complete 12/03/2013   CLINICAL DATA:  Low back pain with history of kyphoplasty  EXAM: LUMBAR SPINE - COMPLETE 4+ VIEW  COMPARISON:  Lumbar spine MRI dated November 21, 2013.  FINDINGS: There is wedge compression of the body of L1 with loss of height anteriorly of approximately 70%. This is stable. There is stable partial compressions of L3 and L4 treated with  kyphoplasty. L2 and L5 are intact. There is facet joint hypertrophy at L4-5 and L5-S1. There is grade 1 anterolisthesis of L4 with respect to L5 which is stable.  IMPRESSION: There are old compressions of the bodies of L1, L3, and L4. There are degenerative facet joint changes at L4-5 and L5-S1. There is no acute lumbar spine abnormality.   Electronically Signed   By: David  Martinique   On: 12/03/2013 12:46   Ct Angio Chest Pe W/cm &/or Wo Cm 11/20/2013   CLINICAL DATA:  Pain and shortness of Breath  EXAM: CT ANGIOGRAPHY CHEST WITH CONTRAST  TECHNIQUE: Multidetector CT imaging of the chest was performed using the standard protocol during bolus administration of intravenous contrast. Multiplanar CT image reconstructions and MIPs were obtained to evaluate the vascular anatomy.  CONTRAST:  172mL OMNIPAQUE IOHEXOL 350 MG/ML SOLN  COMPARISON:  Chest radiograph November 20, 2013 and chest CT May 13, 2013  FINDINGS: There is no demonstrable pulmonary embolus. There is atherosclerotic change in the aorta but no demonstrable thoracic aortic aneurysm or dissection.  There is mild bibasilar lung scarring. There is no lung edema or consolidation.  There is extensive coronary artery calcification. Pericardium is not thickened. There is no appreciable thoracic  adenopathy.  There is a small hiatal hernia. The previously noted periesophageal fluid distally has resolved.  There is postoperative change at the gastroesophageal junction. There is fatty liver. Gallbladder is absent. The common hepatic duct is incompletely visualized but does appear dilated. There is degenerative change in the lower cervical and thoracic spine regions. No blastic or lytic bone lesions are identified. Thyroid appears unremarkable.  Review of the MIP images confirms the above findings.  IMPRESSION: No demonstrable pulmonary embolus.  No lung edema or consolidation.  Extensive coronary artery calcification present.  Postoperative change at the gastroesophageal junction. There is a small hiatal hernia apparent.  There is fatty liver. Visualized extrahepatic biliary ductal system appears enlarged. This area is incompletely visualized.   Electronically Signed   By: Lowella Grip M.D.   On: 11/20/2013 21:45   Mr Lumbar Spine Wo Contrast 11/21/2013   CLINICAL DATA:  Severe back pain. Recent L3 and L4 vertebral augmentation. Persistent with back pain extending into left hip.  EXAM: MRI LUMBAR SPINE WITHOUT CONTRAST  TECHNIQUE: Multiplanar, multisequence MR imaging of the lumbar spine was performed. No intravenous contrast was administered.  COMPARISON:  CT abdomen and pelvis 09/27/2013. Lumbar spine MRI 06/18/2013.  FINDINGS: Slight retrolisthesis of L1 on L2 and grade 1 anterolisthesis of L4 on L5 are unchanged. T12 superior endplate mild compression deformity/Schmorl's node is unchanged. Chronic, severe L1 compression fracture is unchanged from prior MRI. Severe L3 compression fracture is again seen status post interval vertebral augmentation. Mild L4 compression fracture is again seen, also now status post vertebral augmentation. There is no evidence of new compression fracture in the lumbar spine or visualized thoracic spine (imaged from T8 to the sacrum). There is mild lumbar levoscoliosis.  There is mild marrow edema extending from the left T12 inferior facet to the base of the spinous process, unchanged. The distal spinal cord is normal in signal. Conus medullaris terminates at L1. Paraspinal soft tissues are unremarkable.  T12-L1: Mild retropulsion of the L1 superior endplate without stenosis.  L1-2: Mild disc bulge and facet hypertrophy result in mild bilateral neural foraminal narrowing without spinal canal stenosis, unchanged.  L2-3: Mild spinal canal and facet and ligamentum flavum hypertrophy result in mild spinal stenosis and mild bilateral neural foraminal  stenosis, unchanged.  L3-4: Mild disc bulge and mild-to-moderate facet and ligamentum flavum hypertrophy result in mild spinal stenosis and mild bilateral neural foraminal stenosis, unchanged.  L4-5: Prior right laminectomy again noted. Listhesis with uncovering of the disc and moderate facet hypertrophy result in mild bilateral lateral recess narrowing and mild bilateral neural foraminal narrowing, unchanged.  L5-S1: Prior bilateral laminectomies. Endplate spurring and mild facet hypertrophy result in mild left greater than right neural foraminal narrowing, unchanged. No spinal canal stenosis.  IMPRESSION: 1. Unchanged, chronic L1 compression fracture and unchanged height loss of severe L3 and mild L4 compression fractures status post augmentation. No new compression fracture identified. 2. Unchanged, moderate multilevel disc degeneration and facet arthrosis in the lumbar spine resulting in mild spinal stenosis at L2-3 and L3-4 and mild lateral recess narrowing at L4-5.   Electronically Signed   By: Logan Bores   On: 11/21/2013 17:42   Ct Abdomen Pelvis W Contrast 12/03/2013   CLINICAL DATA:  Scan.  Back pain.  Abnormal LFTs.  EXAM: CT ABDOMEN AND PELVIS WITH CONTRAST  TECHNIQUE: Multidetector CT imaging of the abdomen and pelvis was performed using the standard protocol following bolus administration of intravenous contrast.  CONTRAST:   1mL OMNIPAQUE IOHEXOL 300 MG/ML  SOLN  COMPARISON:  Abdomen series 7/28 /2015.  CT 09/27/2013.  FINDINGS: No focal hepatic abnormality. Punctate stable lucency in the medial portion of the liver is unchanged consistent stable 4 mm cyst. Spleen is normal. Pancreas is normal. No biliary distention.  Adrenals normal. Punctate cyst anterior aspect midportion left kidney. This is unchanged. Kidneys are otherwise normal. No hydronephrosis or obstructing ureteral stone. The bladder is nondistended. Hysterectomy. No free pelvic fluid.  No significant inguinal or retroperitoneal adenopathy. Abdominal aorta normal in caliber. Visceral vessels are patent. Renal atherosclerotic vascular disease.  No inflammatory change in right or left lower quadrant. Large amount of stool is present colon. No bowel distention. No free air. No mesenteric mass. No evidence of hernia. Stable changes of prior hiatal hernia surgery. If further evaluation is needed of the surgical site of the hiatal hernia repair, upper GI can be performed. Stomach is nondistended.  Lung bases are clear. Heart size normal. Multiple lumbar compression fractures with postsurgical change from prior vertebroplasty. Methylmethacrylate noted in the L2-3 disc space.  IMPRESSION: 1. No acute abnormality. 2. Postsurgical changes of a hiatal hernial repair, stable from prior CT of 09/27/2013. No bowel distention. No gastric distention. 3. Multiple lumbar compression fractures with postsurgical change, stable.   Electronically Signed   By: Marcello Moores  Register   On: 12/03/2013 15:28   Dg Abd Acute W/chest 12/03/2013   CLINICAL DATA:  Nausea  EXAM: ACUTE ABDOMEN SERIES (ABDOMEN 2 VIEW & CHEST 1 VIEW)  COMPARISON:  PA and lateral chest x-ray of November 20, 2013  FINDINGS: The lungs are mildly hyperinflated but clear. There is stable scarring just above the left lateral costophrenic angle. The heart and mediastinal structures are normal. The bony thorax exhibits no acute abnormality.   Within the abdomen there is a moderate stool burden throughout the colon. There is no ileus nor obstruction. There are surgical clips in the gallbladder fossa and in the region of the GE junction. The patient has undergone kyphoplasty at L3 and L4 in there is compression of the body of L1.  IMPRESSION: 1. COPD without evidence of acute cardiopulmonary abnormality. 2. The bowel gas pattern is nonspecific. There is no evidence of obstruction or perforation. There may be mild clinical constipation.  Electronically Signed   By: David  Martin[place]   On: 12/03/2013 12:49   Ir Radiologist Eval & Mgmt 11/22/2013   EXAM: ESTABLISHED PATIENT OFFICE VISIT  CHIEF COMPLAINT: Worsening of low back pain.  Current Pain Level: 1-10  HISTORY OF PRESENT ILLNESS: The patient is a 78 year old lady who is status post vertebral body augmentation at L3-L4 two weeks ago without any complications. Patient is accompanied by her husband. Reportedly she had modest relief after the procedures to where her pain was 3 out of 10 to 4 out of 10. She reports she only required tramadol on an intermittent basis.  The patient was moving much better than before. Her appetite had returned to near normal.  However yesterday after a bout of severe coughing, the patient experienced a sudden pain in the lower thoracic upper lumbar regions necessitating a visit to the ER.  She underwent a CT angiogram and CT of her chest.  At the present time the patient reports her pain to be a 9 out of 10 minimally relieved with tramadol. The pain apparently is exacerbated by coughing, sneezing or turning. She denies any radiation of pain into her upper extremities or lower extremities. She denies any autonomic dysfunction of her bowel or bladder. She denies any UTI symptoms of dysuria, frequency of micturition or hematuria. She denies any recent chills or fever or rigors.  Her appetite is significantly decreased since yesterday. However she is still able to mobilize with  modest difficulty using a walker.  The remainder of her history remains unchanged.  Medications: Xanax. Amlodipine. Gabapentin. Losartan. Reglan. Metoprolol. Dulera. MiraLax. Pravachol. Phenergan. Tramadol. Tudorza inhalers for wheezing as needed.  Allergies: Aspartame, causes nausea/vomiting. Daliresp, nausea/vomiting. Hydrocodone, nausea only. Levofloxacin, nausea/vomiting. Oxycodone nausea only. Ace inhibitors, cough.  PHYSICAL EXAMINATION: On brief examination, the patient appears to be in significant distress on account of her pain. She is almost teary eyed. Otherwise, she remains awake, alert, oriented to time, place, space. Questions are answered appropriately. No gross neurological findings.  On palpation there is exquisite tenderness above the previously treated L3 vertebral body.  ASSESSMENT AND PLAN: Given the above clinical findings, the primary consideration for patient's deterioration most probably represents a new fracture. An MRI of the lower thoracic and lumbar spine will be obtained. In the meantime, the patient has been asked to continue with previous precautionary measures. She has been also advised to use a heating pad for relief. Both the husband and the patient are in agreement to proceed with an MRI of the thoracolumbar spine. Further recommendations to follow findings on the MRI scan.   Electronically Signed   By: Luanne Bras M.D.   On: 11/21/2013 13:45   US Abdomen Limited Ruq  12/03/2013   CLINICAL DATA:  Increased LFTs.  Abdominal pain.  Nausea.  EXAM: US ABDOMEN LIMITED - RIGHT UPPER QUADRANT  COMPARISON:  CT 12/03/2013  FINDINGS: Gallbladder:  Surgically absent  Common bile duct:  Diameter: 7 mm  Liver:  Within normal limits in parenchymal echogenicity. Sub cm hypoechoic lesion within the hepatic parenchyma, right hepatic lobe, too small to characterize.  IMPRESSION: Common bile duct measures 7 mm.  Status post cholecystectomy.   Electronically Signed   By: Lovey Newcomer M.D.    On: 12/03/2013 19:50    Audree Bane  [facility] Pager:[phone]  If 7PM-7AM, please contact night-coverage [email or web address] Password [email or web address] 12/05/2013, 1:01 PM   LOS: 2 days   **Disclaimer: This note may have been dictated with voice  recognition software. Similar sounding words can inadvertently be transcribed and this note may contain transcription errors which may not have been corrected upon publication of note.**

## 2013-12-05 NOTE — Progress Notes (Signed)
INITIAL NUTRITION ASSESSMENT  DOCUMENTATION CODES Per approved criteria  -Severe malnutrition in the context of chronic illness  Pt meets criteria for severe MALNUTRITION in the context of chronic illness as evidenced by 25% weight loss in <1 year and reported intake meeting <75% of estimated needs for >1 month.  INTERVENTION: Glucerna Shake po TID, each supplement provides 220 kcal and 10 grams of protein  NUTRITION DIAGNOSIS: Inadequate oral intake related to nausea as evidenced by weight loss and reported intake less than estimated needs.   Goal: Pt to meet >/= 90% of their estimated nutrition needs   Monitor:  Weight trends, po intake, labs  Reason for Assessment: MST  78 y.o. female  Admitting Dx: <principal problem not specified>  ASSESSMENT: 78 year old female with a history of coronary artery disease, hypertension, esophageal stricture, gastritis, gastroesophageal reflux disease comes in with nausea and back pain.   - Pt reports that her body weight was 172 lbs on 04/24/14. She has lost 43 lbs since that time.  - Pt reports that she has not been eating well because she has been nauseated since she underwent repair of a hiatal hernia.  - She has not had any further nausea since being on a full liquid diet.  - Pt would like to have Glucerna Shakes instead of Ensure Complete because she says that the Ensure "runs up her blood sugar." - Pt with no significant signs of fat and/or muscle wasting  Height: Ht Readings from Last 1 Encounters:  12/04/13 5\' 5"  (1.651 m)    Weight: Wt Readings from Last 1 Encounters:  12/04/13 129 lb 9.6 oz (58.786 kg)    Ideal Body Weight: 57 kg  % Ideal Body Weight: 103%  Wt Readings from Last 10 Encounters:  12/04/13 129 lb 9.6 oz (58.786 kg)  11/19/13 132 lb (59.875 kg)  11/14/13 127 lb (57.607 kg)  11/05/13 125 lb (56.7 kg)  10/28/13 129 lb 3.2 oz (58.605 kg)  10/01/13 125 lb (56.7 kg)  09/20/13 123 lb (55.792 kg)  09/10/13  125 lb (56.7 kg)  08/20/13 129 lb (58.514 kg)  08/14/13 129 lb 12.8 oz (58.877 kg)    Usual Body Weight: 172 lbs in December 2014  % Usual Body Weight: 75%  BMI:  Body mass index is 21.57 kg/(m^2).  Estimated Nutritional Needs: Kcal: 1600-1800 Protein: 75-85 g Fluid: 1.8 L/day  Skin: WNL  Diet Order: Criss Rosales  EDUCATION NEEDS: -Education needs addressed   Intake/Output Summary (Last 24 hours) at 12/05/13 1019 Last data filed at 12/04/13 2300  Gross per 24 hour  Intake    447 ml  Output   1001 ml  Net   -554 ml    Last BM: 7/29   Labs:   Recent Labs Lab 12/03/13 1040 12/03/13 2030 12/04/13 0014 12/05/13 0610  NA 142  --  138 142  K 4.6  --  4.1 4.3  CL 101  --  100 106  CO2 26  --  21 27  BUN 12  --  10 7  CREATININE 0.78 0.79 0.67 0.74  CALCIUM 9.9  --  9.1 9.4  GLUCOSE 111*  --  89 89    CBG (last 3)  No results found for this basename: GLUCAP,  in the last 72 hours  Scheduled Meds: . amLODipine  10 mg Oral Daily  . enoxaparin (LOVENOX) injection  40 mg Subcutaneous Q24H  . gabapentin  300 mg Oral TID  . losartan  100 mg Oral Daily  .  metoCLOPramide  5 mg Oral TID AC  . metoprolol tartrate  12.5 mg Oral QHS  . mometasone-formoterol  2 puff Inhalation BID  . polyethylene glycol  17 g Oral BID  . senna  2 tablet Oral QHS  . sodium chloride  3 mL Intravenous Q12H    Continuous Infusions:   Past Medical History  Diagnosis Date  . CAD (coronary artery disease)     LHC 2003 with 40% pLAD, 30% mLAD, 100% D1 (moderate vessel), 100%  D2 (small vessel), 95% mid small OM2.  Pt had PTCA to D1;  D2 and OM2 small vessels and tx medically;  Last Myoview (2012): anterior infarct seen with scar, no ischemia. EF normal. Pt managed medically;  Lexiscan Myoview (12/14):  Low risk; ant scar with very mild peri-infarct ischemia; EF 55% with ant AK   . Recurrent aspiration bronchitis/pneumonia     PFTs 12/09 FVC 97%, FEV1 101%, ratio 77%, TLC 109%. No significant  obstruction or restriction.   . Hypertension   . Hyperlipidemia   . Esophageal stricture   . Gastritis   . GERD (gastroesophageal reflux disease)   . Asthma   . Blood transfusion   . Cough   . Wheezing   . Weakness   . Trouble swallowing   . Abdominal pain   . Hiatal hernia   . Shortness of breath   . Anxiety   . TIA (transient ischemic attack)   . BRONCHITIS, ACUTE 07/25/2007         . DIVERTICULOSIS, COLON 08/28/2008    Qualifier: Diagnosis of  By: [name], [name], [name]    . IRON DEFICIENCY ANEMIA, HX OF 07/24/2007    Qualifier: Diagnosis of  By: Tilden Dome    . Chronic nausea   . Chronic back pain   . PONV (postoperative nausea and vomiting)     Past Surgical History  Procedure Laterality Date  . Back surgery    . Bladder repair    . Hemorrhoid surgery    . Cholecystectomy      Patient unsure of date.  [name] Appendectomy      patient unsure of date  . Total abdominal hysterectomy  1975    partial  . Coronary angioplasty  11/16/2001  . Eye surgery  11/2000    bilateral cataracts with lens implant  . Hiatal hernia repair  06/03/2011    Procedure: LAPAROSCOPIC REPAIR OF HIATAL HERNIA;  Surgeon: Adin Hector, MD;  Location: [facility];  Service: General;  Laterality: N/A;  . Laparoscopic nissen fundoplication  7/37/1062    Procedure: LAPAROSCOPIC NISSEN FUNDOPLICATION;  Surgeon: Adin Hector, MD;  Location: [facility];  Service: General;  Laterality: N/A;  . Lumbar laminectomy/decompression microdiscectomy Right 06/26/2012    Procedure: LUMBAR LAMINECTOMY/DECOMPRESSION MICRODISCECTOMY;  Surgeon: Jessy Oto, MD;  Location: [place];  Service: Orthopedics;  Laterality: Right;  RIGHT L4-5 MICRODISCECTOMY  . Esophagogastroduodenoscopy N/A 03/15/2013    Procedure: ESOPHAGOGASTRODUODENOSCOPY (EGD);  Surgeon: Jerene Bears, MD;  Location: [facility];  Service: Endoscopy;  Laterality: N/A;  . Esophageal manometry N/A 03/25/2013    Procedure: ESOPHAGEAL MANOMETRY (EM);  Surgeon:  Inda Castle, MD;  Location: [facility];  Service: Endoscopy;  Laterality: N/A;  . Hiatal hernia repair N/A 04/24/2013    Procedure: LAPAROSCOPIC  REPAIR RECURRENT PARASOPHAGEAL HIATAL HERNIA WITH FUNDOPLICATION;  Surgeon: Adin Hector, MD;  Location: [facility];  Service: General;  Laterality: N/A;  . Insertion of mesh N/A 04/24/2013    Procedure: INSERTION  OF MESH;  Surgeon: Adin Hector, MD;  Location: [facility];  Service: General;  Laterality: N/A;  . Laparoscopic lysis of adhesions N/A 04/24/2013    Procedure: LAPAROSCOPIC LYSIS OF ADHESIONS;  Surgeon: Adin Hector, MD;  Location: [facility];  Service: General;  Laterality: N/A;  . Fixation kyphoplasty lumbar spine  X 2    "L3-4"    Terrace Arabia RD, LDN

## 2013-12-05 NOTE — Progress Notes (Signed)
Progress Note   Subjective   No further nausea since yesterday.  Small BM this morning with help of Miralax last night.  Brown in color and soft. LLQ still there but decreased from yesterday.  Tolerating diet well.   Objective   Vital signs in last 24 hours: Temp:  [97.8 F (36.6 C)-98.2 F (36.8 C)] 98 F (36.7 C) (07/30 0427) Pulse Rate:  [60-77] 60 (07/30 0427) Resp:  [16-20] 16 (07/30 0427) BP: (110-146)/(63-71) 110/63 mmHg (07/30 0427) SpO2:  [96 %-98 %] 98 % (07/30 0427) Weight:  [129 lb 9.6 oz (58.786 kg)] 129 lb 9.6 oz (58.786 kg) (07/29 1756) Last BM Date: 12/04/13 General:   WDWN female in NAD Heart:  Normal S1 S2, No MGR noted. Lungs: CTA bilatteral.  Unlabored and even. Abdomen:  Soft, ND. Normal bowel sounds. Tender to LLQ. Extremities:  Without edema.  2+ DP and radial pulses. Neurologic:  Alert and oriented,  grossly normal neurologically. Psych:  Cooperative. Normal mood and affect.  Intake/Output from previous day: 07/29 0701 - 07/30 0700 In: 669 [P.O.:666; I.V.:3] Out: 1002 [Urine:1002] Intake/Output this shift:    Lab Results:  Recent Labs  12/03/13 1040 12/03/13 2030 12/04/13 0014  WBC 5.8 7.5 6.3  HGB 15.5* 14.4 13.6  HCT 44.4 44.2 42.0  PLT 343 372 330   BMET  Recent Labs  12/03/13 1040 12/03/13 2030 12/04/13 0014 12/05/13 0610  NA 142  --  138 142  K 4.6  --  4.1 4.3  CL 101  --  100 106  CO2 26  --  21 27  GLUCOSE 111*  --  89 89  BUN 12  --  10 7  CREATININE 0.78 0.79 0.67 0.74  CALCIUM 9.9  --  9.1 9.4   LFT  Recent Labs  12/05/13 0610  PROT 6.3  ALBUMIN 3.3*  AST 68*  ALT 227*  ALKPHOS 185*  BILITOT 0.7   PT/INR  Recent Labs  12/03/13 2030  LABPROT 12.9  INR 0.97    Studies/Results: Dg Thoracic Spine 2 View  12/03/2013   CLINICAL DATA:  Lower thoracic and lumbar pain with history of kyphoplasty  EXAM: THORACIC SPINE - 2 VIEW  COMPARISON:  PA and lateral chest x-ray dated November 20, 2013  FINDINGS: The  thoracic vertebral bodies are preserved in height. There is compression of the body of L1. The thoracic disc space heights are well maintained. The pedicles appear intact. There are no abnormal paravertebral soft tissue densities.  IMPRESSION: There is no acute compression of the thoracic vertebral bodies.   Electronically Signed   By: David  Martinique   On: 12/03/2013 12:43   Dg Lumbar Spine Complete  12/03/2013   CLINICAL DATA:  Low back pain with history of kyphoplasty  EXAM: LUMBAR SPINE - COMPLETE 4+ VIEW  COMPARISON:  Lumbar spine MRI dated November 21, 2013.  FINDINGS: There is wedge compression of the body of L1 with loss of height anteriorly of approximately 70%. This is stable. There is stable partial compressions of L3 and L4 treated with kyphoplasty. L2 and L5 are intact. There is facet joint hypertrophy at L4-5 and L5-S1. There is grade 1 anterolisthesis of L4 with respect to L5 which is stable.  IMPRESSION: There are old compressions of the bodies of L1, L3, and L4. There are degenerative facet joint changes at L4-5 and L5-S1. There is no acute lumbar spine abnormality.   Electronically Signed   By: David  Martinique  On: 12/03/2013 12:46   Ct Abdomen Pelvis W Contrast  12/03/2013   CLINICAL DATA:  Scan.  Back pain.  Abnormal LFTs.  EXAM: CT ABDOMEN AND PELVIS WITH CONTRAST  TECHNIQUE: Multidetector CT imaging of the abdomen and pelvis was performed using the standard protocol following bolus administration of intravenous contrast.  CONTRAST:  65mL OMNIPAQUE IOHEXOL 300 MG/ML  SOLN  COMPARISON:  Abdomen series 7/28 /2015.  CT 09/27/2013.  FINDINGS: No focal hepatic abnormality. Punctate stable lucency in the medial portion of the liver is unchanged consistent stable 4 mm cyst. Spleen is normal. Pancreas is normal. No biliary distention.  Adrenals normal. Punctate cyst anterior aspect midportion left kidney. This is unchanged. Kidneys are otherwise normal. No hydronephrosis or obstructing ureteral stone.  The bladder is nondistended. Hysterectomy. No free pelvic fluid.  No significant inguinal or retroperitoneal adenopathy. Abdominal aorta normal in caliber. Visceral vessels are patent. Renal atherosclerotic vascular disease.  No inflammatory change in right or left lower quadrant. Large amount of stool is present colon. No bowel distention. No free air. No mesenteric mass. No evidence of hernia. Stable changes of prior hiatal hernia surgery. If further evaluation is needed of the surgical site of the hiatal hernia repair, upper GI can be performed. Stomach is nondistended.  Lung bases are clear. Heart size normal. Multiple lumbar compression fractures with postsurgical change from prior vertebroplasty. Methylmethacrylate noted in the L2-3 disc space.  IMPRESSION: 1. No acute abnormality. 2. Postsurgical changes of a hiatal hernial repair, stable from prior CT of 09/27/2013. No bowel distention. No gastric distention. 3. Multiple lumbar compression fractures with postsurgical change, stable.   Electronically Signed   By: Marcello Moores  Register   On: 12/03/2013 15:28   Dg Abd Acute W/chest  12/03/2013   CLINICAL DATA:  Nausea  EXAM: ACUTE ABDOMEN SERIES (ABDOMEN 2 VIEW & CHEST 1 VIEW)  COMPARISON:  PA and lateral chest x-ray of November 20, 2013  FINDINGS: The lungs are mildly hyperinflated but clear. There is stable scarring just above the left lateral costophrenic angle. The heart and mediastinal structures are normal. The bony thorax exhibits no acute abnormality.  Within the abdomen there is a moderate stool burden throughout the colon. There is no ileus nor obstruction. There are surgical clips in the gallbladder fossa and in the region of the GE junction. The patient has undergone kyphoplasty at L3 and L4 in there is compression of the body of L1.  IMPRESSION: 1. COPD without evidence of acute cardiopulmonary abnormality. 2. The bowel gas pattern is nonspecific. There is no evidence of obstruction or perforation. There  may be mild clinical constipation.   Electronically Signed   By: David  Martinique   On: 12/03/2013 12:49   US Abdomen Limited Ruq  12/03/2013   CLINICAL DATA:  Increased LFTs.  Abdominal pain.  Nausea.  EXAM: US ABDOMEN LIMITED - RIGHT UPPER QUADRANT  COMPARISON:  CT 12/03/2013  FINDINGS: Gallbladder:  Surgically absent  Common bile duct:  Diameter: 7 mm  Liver:  Within normal limits in parenchymal echogenicity. Sub cm hypoechoic lesion within the hepatic parenchyma, right hepatic lobe, too small to characterize.  IMPRESSION: Common bile duct measures 7 mm.  Status post cholecystectomy.   Electronically Signed   By: Lovey Newcomer M.D.   On: 12/03/2013 19:50       Assessment / Plan:    - Elevated LFTs - Continuing to trend down.  Likely drug induced. - Nausea without vomiting - Relief with zofran and reglan. Chronic.  Likely 2/2 fundoplication. Supportive care.  Dispo - Home today vs tomorrow with continued drop in LFTs and decrease of symptoms. Will need Outpatient follow up.   LOS: 2 days   Lollie Marrow  12/05/2013, 8:35 AM   I have personally seen the patient, reviewed and repeated key elements of the history and physical and participated in formation of the assessment and plan the student has documented.  Improved. Presumed reaction to augmentin. She can f/u LFT's through PCP and go to GI (Dr. [name] if they remain elevated or PCP has other ?  Gatha Mayer, MD, [facility] [phone] (pager) 12/05/2013 5:20 PM

## 2013-12-06 LAB — MYCOPLASMA PNEUMONIAE ANTIBODY, IGM: MYCOPLASMA PNEUMO IGM: 224 U/mL (ref <770)

## 2013-12-09 LAB — MITOCHONDRIAL ANTIBODIES: Mitochondrial M2 Ab, IgG: 1.12 — ABNORMAL HIGH (ref <0.91)

## 2013-12-12 ENCOUNTER — Encounter: Payer: Self-pay | Admitting: Physician Assistant

## 2013-12-12 ENCOUNTER — Ambulatory Visit (INDEPENDENT_AMBULATORY_CARE_PROVIDER_SITE_OTHER): Payer: Medicare HMO | Admitting: Physician Assistant

## 2013-12-12 ENCOUNTER — Other Ambulatory Visit: Payer: Self-pay

## 2013-12-12 VITALS — BP 92/58 | HR 80 | Temp 97.7°F | Resp 16 | Wt 127.0 lb

## 2013-12-12 DIAGNOSIS — I959 Hypotension, unspecified: Secondary | ICD-10-CM

## 2013-12-12 DIAGNOSIS — M549 Dorsalgia, unspecified: Secondary | ICD-10-CM

## 2013-12-12 DIAGNOSIS — R945 Abnormal results of liver function studies: Principal | ICD-10-CM

## 2013-12-12 DIAGNOSIS — R7989 Other specified abnormal findings of blood chemistry: Secondary | ICD-10-CM

## 2013-12-12 LAB — CBC WITH DIFFERENTIAL/PLATELET
BASOS ABS: 0 10*3/uL (ref 0.0–0.1)
BASOS PCT: 1 % (ref 0–1)
EOS PCT: 7 % — AB (ref 0–5)
Eosinophils Absolute: 0.3 10*3/uL (ref 0.0–0.7)
HEMATOCRIT: 38.6 % (ref 36.0–46.0)
Hemoglobin: 13.1 g/dL (ref 12.0–15.0)
Lymphocytes Relative: 27 % (ref 12–46)
Lymphs Abs: 1.3 10*3/uL (ref 0.7–4.0)
MCH: 29.5 pg (ref 26.0–34.0)
MCHC: 33.9 g/dL (ref 30.0–36.0)
MCV: 86.9 fL (ref 78.0–100.0)
MONO ABS: 0.6 10*3/uL (ref 0.1–1.0)
Monocytes Relative: 13 % — ABNORMAL HIGH (ref 3–12)
Neutro Abs: 2.4 10*3/uL (ref 1.7–7.7)
Neutrophils Relative %: 52 % (ref 43–77)
Platelets: 338 10*3/uL (ref 150–400)
RBC: 4.44 MIL/uL (ref 3.87–5.11)
RDW: 15.6 % — AB (ref 11.5–15.5)
WBC: 4.7 10*3/uL (ref 4.0–10.5)

## 2013-12-12 LAB — BASIC METABOLIC PANEL WITH GFR
BUN: 27 mg/dL — ABNORMAL HIGH (ref 6–23)
CO2: 27 mEq/L (ref 19–32)
CREATININE: 0.94 mg/dL (ref 0.50–1.10)
Calcium: 9.4 mg/dL (ref 8.4–10.5)
Chloride: 104 mEq/L (ref 96–112)
GFR, EST AFRICAN AMERICAN: 68 mL/min
GFR, EST NON AFRICAN AMERICAN: 59 mL/min — AB
Glucose, Bld: 49 mg/dL — ABNORMAL LOW (ref 70–99)
Potassium: 4.7 mEq/L (ref 3.5–5.3)
Sodium: 140 mEq/L (ref 135–145)

## 2013-12-12 LAB — HEPATIC FUNCTION PANEL
ALT: 35 U/L (ref 0–35)
AST: 14 U/L (ref 0–37)
Albumin: 4 g/dL (ref 3.5–5.2)
Alkaline Phosphatase: 99 U/L (ref 39–117)
Bilirubin, Direct: 0.2 mg/dL (ref 0.0–0.3)
Indirect Bilirubin: 0.5 mg/dL (ref 0.2–1.2)
TOTAL PROTEIN: 6.6 g/dL (ref 6.0–8.3)
Total Bilirubin: 0.7 mg/dL (ref 0.2–1.2)

## 2013-12-12 NOTE — Patient Instructions (Addendum)
Please call Dr. Kelby Fam office and set up an appointment Phone: [phone];  Please stay off the pravastatin for now  Your blood pressure is low and this can cause fatigue, dizziness, and falls. We are checking some labs on your but in the mean time please cut the Norvasc/Amlodipine in half and continue to monitor your blood pressure. If you blood pressure stays below 120/70 you can stop the norvasc/amlodipine.   Hypotension As your heart beats, it forces blood through your arteries. This force is your blood pressure. If your blood pressure is too low for you to go about your normal activities or to support the organs of your body, you have hypotension. Hypotension is also referred to as low blood pressure. When your blood pressure becomes too low, you may not get enough blood to your brain. As a result, you may feel weak, feel lightheaded, or develop a rapid heart rate. In a more severe case, you may faint. CAUSES Various conditions can cause hypotension. These include:  Blood loss.  Dehydration.  Heart or endocrine problems.  Pregnancy.  Severe infection.  Not having a well-balanced diet filled with needed nutrients.  Severe allergic reactions (anaphylaxis). Some medicines, such as blood pressure medicine or water pills (diuretics), may lower your blood pressure below normal. Sometimes taking too much medicine or taking medicine not as directed can cause hypotension. TREATMENT  Hospitalization is sometimes required for hypotension if fluid or blood replacement is needed, if time is needed for medicines to wear off, or if further monitoring is needed. Treatment might include changing your diet, changing your medicines (including medicines aimed at raising your blood pressure), and use of support stockings. HOME CARE INSTRUCTIONS   Drink enough fluids to keep your urine clear or pale yellow.  Take your medicines as directed by your health care provider.  Get up slowly from  reclining or sitting positions. This gives your blood pressure a chance to adjust.  Wear support stockings as directed by your health care provider.  Maintain a healthy diet by including nutritious food, such as fruits, vegetables, nuts, whole grains, and lean meats. SEEK MEDICAL CARE IF:  You have vomiting or diarrhea.  You have a fever for more than 2-3 days.  You feel more thirsty than usual.  You feel weak and tired. SEEK IMMEDIATE MEDICAL CARE IF:   You have chest pain or a fast or irregular heartbeat.  You have a loss of feeling in some part of your body, or you lose movement in your arms or legs.  You have trouble speaking.  You become sweaty or feel lightheaded.  You faint. MAKE SURE YOU:   Understand these instructions.  Will watch your condition.  Will get help right away if you are not doing well or get worse. Document Released: 04/25/2005 Document Revised: 02/13/2013 Document Reviewed: 10/26/2012 [facility] Patient Information 2015 [place], [place]. This information is not intended to replace advice given to you by your health care provider. Make sure you discuss any questions you have with your health care provider.

## 2013-12-12 NOTE — Progress Notes (Signed)
Subjective:    Patient ID: [name] [name], female    DOB: 12-Mar-1936, 78 y.o.   MRN: [id number]  HPI 78 y.o. female was admitted to the ER for worsening nausea, vomiting and abdominal pain. She was found to have elevated LFTs suspected secondary to medications. GI was consulted, normal RUQ [place], Tylenol level, salicylate level was negative, Acute hepatitis panel negative, ANA negative, Antimitochondrial antibody pending. She is off her statin. She was also found to have constipation which she takes miralax for at every meal but this is not working as well. LFTs were treading down, she is here today for follow up labs. Since discharge she continue to have nausea with eating and has had fatigue. Her BP today is BP: 92/58 mmHg.    Lab Results  Component Value Date   ALT 227* 12/05/2013   AST 68* 12/05/2013   ALKPHOS 185* 12/05/2013   BILITOT 0.7 12/05/2013   Ct Angio Chest Pe W/cm &/or Wo Cm  11/20/2013 CLINICAL DATA: Pain and shortness of Breath EXAM: CT ANGIOGRAPHY CHEST WITH CONTRAST TECHNIQUE: Multidetector CT imaging of the chest was performed using the standard protocol during bolus administration of intravenous contrast. Multiplanar CT image reconstructions and MIPs were obtained to evaluate the vascular anatomy. CONTRAST: 128mL OMNIPAQUE IOHEXOL 350 MG/ML SOLN COMPARISON: Chest radiograph November 20, 2013 and chest CT May 13, 2013 FINDINGS: There is no demonstrable pulmonary embolus. There is atherosclerotic change in the aorta but no demonstrable thoracic aortic aneurysm or dissection. There is mild bibasilar lung scarring. There is no lung edema or consolidation. There is extensive coronary artery calcification. Pericardium is not thickened. There is no appreciable thoracic adenopathy. There is a small hiatal hernia. The previously noted periesophageal fluid distally has resolved. There is postoperative change at the gastroesophageal junction. There is fatty liver. Gallbladder is absent. The common  hepatic duct is incompletely visualized but does appear dilated. There is degenerative change in the lower cervical and thoracic spine regions. No blastic or lytic bone lesions are identified. Thyroid appears unremarkable. Review of the MIP images confirms the above findings. IMPRESSION: No demonstrable pulmonary embolus. No lung edema or consolidation. Extensive coronary artery calcification present. Postoperative change at the gastroesophageal junction. There is a small hiatal hernia apparent. There is fatty liver. Visualized extrahepatic biliary ductal system appears enlarged. This area is incompletely visualized. Electronically Signed By: Lowella Grip M.D. On: 11/20/2013 21:45      Medication List       This list is accurate as of: 12/12/13  9:49 AM.  Always use your most recent med list.               ALPRAZolam 0.5 MG tablet  Commonly known as:  XANAX  Take 1 tablet (0.5 mg total) by mouth 3 (three) times daily as needed for sleep or anxiety.     amLODipine 10 MG tablet  Commonly known as:  NORVASC  Take 10 mg by mouth daily. For blood pressure     feeding supplement (GLUCERNA SHAKE) Liqd  Take 237 mLs by mouth 3 (three) times daily between meals.     gabapentin 300 MG capsule  Commonly known as:  NEURONTIN  Take 1 capsule (300 mg total) by mouth 3 (three) times daily.     losartan 100 MG tablet  Commonly known as:  COZAAR  Take 100 mg by mouth daily.     metoCLOPramide 10 MG tablet  Commonly known as:  REGLAN  Take 0.5 tablets (5 mg total) by  mouth 3 (three) times daily before meals.     metoprolol tartrate 25 MG tablet  Commonly known as:  LOPRESSOR  Take 12.5 mg by mouth at bedtime.     mometasone-formoterol 200-5 MCG/ACT Aero  Commonly known as:  DULERA  Inhale 2 puffs into the lungs 2 (two) times daily.     ondansetron 8 MG disintegrating tablet  Commonly known as:  ZOFRAN ODT  Take 1 tablet (8 mg total) by mouth every 8 (eight) hours as needed for nausea or  vomiting. For nausea     OVER THE COUNTER MEDICATION  Take 1,000 mg by mouth daily. Coral Calcium 1000mg      OVER THE COUNTER MEDICATION  Take 400 mg by mouth 3 (three) times daily. Magnesium Carbonate 400mg      polyethylene glycol packet  Commonly known as:  MIRALAX / GLYCOLAX  Take 17 g by mouth 2 (two) times daily.     pravastatin 40 MG tablet  Commonly known as:  PRAVACHOL  Take 40 mg by mouth daily.     senna 8.6 MG Tabs tablet  Commonly known as:  SENOKOT  Take 2 tablets (17.2 mg total) by mouth at bedtime.     traMADol 50 MG tablet  Commonly known as:  ULTRAM  Take 50 mg by mouth every 6 (six) hours as needed for moderate pain or severe pain.     TUDORZA PRESSAIR 400 MCG/ACT Aepb  Generic drug:  Aclidinium Bromide  Inhale 1 puff into the lungs 2 (two) times daily as needed (wheezing).     Vitamin D3 2000 UNITS Tabs  Take 6,000 Units by mouth daily.       Blood pressure 92/58, pulse 80, temperature 97.7 F (36.5 C), resp. rate 16, weight 127 lb (57.607 kg).  Review of Systems  Constitutional: Positive for appetite change (decreased) and fatigue. Negative for fever, chills, diaphoresis, activity change and unexpected weight change.  HENT: Negative.   Respiratory: Negative.   Cardiovascular: Negative.   Gastrointestinal: Positive for nausea and abdominal pain. Negative for vomiting, diarrhea, constipation, blood in stool, abdominal distention, anal bleeding and rectal pain.  Genitourinary: Negative.   Musculoskeletal: Positive for back pain, gait problem (walks with walker) and myalgias. Negative for arthralgias, joint swelling, neck pain and neck stiffness.  Skin: Negative.  Negative for color change (no hyperpigmentation).  Neurological: Positive for weakness and light-headedness. Negative for dizziness, tremors, seizures, syncope, facial asymmetry, speech difficulty, numbness and headaches.  Hematological: Negative.        Objective:   Physical Exam  Nursing  note and vitals reviewed. Constitutional: She is oriented to person, place, and time. She appears well-developed. No distress.  HENT:  Head: Normocephalic and atraumatic.  Right Ear: External ear normal.  Left Ear: External ear normal.  Nose: Nose normal.  Mouth/Throat: Oropharynx is clear and moist. No oropharyngeal exudate.  Eyes: Conjunctivae and EOM are normal.  Neck: Normal range of motion. Neck supple. No JVD present. No thyromegaly present.  Cardiovascular: Normal rate, regular rhythm, normal heart sounds and intact distal pulses.   Pulmonary/Chest: Effort normal and breath sounds normal.  Abdominal: Soft. Bowel sounds are normal. She exhibits no distension and no mass. There is tenderness (RUQ). There is no rebound and no guarding.  Minimal epigastric  Musculoskeletal: Normal range of motion. She exhibits no edema and no tenderness.  Walks with walker  Lymphadenopathy:    She has no cervical adenopathy.  Neurological: She is alert and oriented to person, place, and time.  No cranial nerve deficit.  Skin: Skin is warm and dry. No rash noted. No erythema. No pallor.  Psychiatric: She has a normal mood and affect. Her behavior is normal. Judgment and thought content normal.      Assessment & Plan:  Hypotension/fatigue- check CBC, BMP, and will decrease norvasc to 5mg  for now Abnormal transaminases- ? Medications, + AMA with RUQ pain, no pruritis, hyperpigmentation, on CT chest with contrast biliary tract enlarged- will get ceruloplasmin, recheck LFTs, and refer to GI to rule out PBS.   Close follow up 4 weeks OVER 30 minutes of exam, counseling, chart review, referral performed

## 2013-12-16 LAB — CERULOPLASMIN: Ceruloplasmin: 24 mg/dL (ref 18–53)

## 2013-12-17 ENCOUNTER — Other Ambulatory Visit: Payer: Self-pay | Admitting: Physician Assistant

## 2013-12-21 ENCOUNTER — Emergency Department (HOSPITAL_COMMUNITY)
Admission: EM | Admit: 2013-12-21 | Discharge: 2013-12-21 | Disposition: A | Discharge status: Home / Self Care | Payer: Medicare HMO | Attending: Emergency Medicine | Admitting: Emergency Medicine

## 2013-12-21 ENCOUNTER — Emergency Department (HOSPITAL_COMMUNITY): Payer: Medicare HMO

## 2013-12-21 ENCOUNTER — Encounter (HOSPITAL_COMMUNITY): Payer: Self-pay | Admitting: Emergency Medicine

## 2013-12-21 DIAGNOSIS — F411 Generalized anxiety disorder: Secondary | ICD-10-CM | POA: Diagnosis not present

## 2013-12-21 DIAGNOSIS — Z9861 Coronary angioplasty status: Secondary | ICD-10-CM | POA: Insufficient documentation

## 2013-12-21 DIAGNOSIS — Z8639 Personal history of other endocrine, nutritional and metabolic disease: Secondary | ICD-10-CM | POA: Insufficient documentation

## 2013-12-21 DIAGNOSIS — G8929 Other chronic pain: Secondary | ICD-10-CM | POA: Insufficient documentation

## 2013-12-21 DIAGNOSIS — J45909 Unspecified asthma, uncomplicated: Secondary | ICD-10-CM | POA: Diagnosis not present

## 2013-12-21 DIAGNOSIS — Z8673 Personal history of transient ischemic attack (TIA), and cerebral infarction without residual deficits: Secondary | ICD-10-CM | POA: Diagnosis not present

## 2013-12-21 DIAGNOSIS — Z79899 Other long term (current) drug therapy: Secondary | ICD-10-CM | POA: Diagnosis not present

## 2013-12-21 DIAGNOSIS — K219 Gastro-esophageal reflux disease without esophagitis: Secondary | ICD-10-CM | POA: Diagnosis not present

## 2013-12-21 DIAGNOSIS — R110 Nausea: Secondary | ICD-10-CM | POA: Diagnosis not present

## 2013-12-21 DIAGNOSIS — I10 Essential (primary) hypertension: Secondary | ICD-10-CM | POA: Diagnosis not present

## 2013-12-21 DIAGNOSIS — I2510 Atherosclerotic heart disease of native coronary artery without angina pectoris: Secondary | ICD-10-CM | POA: Insufficient documentation

## 2013-12-21 DIAGNOSIS — Z862 Personal history of diseases of the blood and blood-forming organs and certain disorders involving the immune mechanism: Secondary | ICD-10-CM | POA: Insufficient documentation

## 2013-12-21 LAB — CBC WITH DIFFERENTIAL/PLATELET
Basophils Absolute: 0 10*3/uL (ref 0.0–0.1)
Basophils Relative: 1 % (ref 0–1)
EOS ABS: 0.1 10*3/uL (ref 0.0–0.7)
Eosinophils Relative: 2 % (ref 0–5)
HCT: 40.4 % (ref 36.0–46.0)
Hemoglobin: 13.7 g/dL (ref 12.0–15.0)
LYMPHS ABS: 1.6 10*3/uL (ref 0.7–4.0)
Lymphocytes Relative: 25 % (ref 12–46)
MCH: 30 pg (ref 26.0–34.0)
MCHC: 33.9 g/dL (ref 30.0–36.0)
MCV: 88.6 fL (ref 78.0–100.0)
Monocytes Absolute: 0.6 10*3/uL (ref 0.1–1.0)
Monocytes Relative: 9 % (ref 3–12)
Neutro Abs: 4.1 10*3/uL (ref 1.7–7.7)
Neutrophils Relative %: 63 % (ref 43–77)
PLATELETS: 331 10*3/uL (ref 150–400)
RBC: 4.56 MIL/uL (ref 3.87–5.11)
RDW: 14.8 % (ref 11.5–15.5)
WBC: 6.4 10*3/uL (ref 4.0–10.5)

## 2013-12-21 LAB — COMPREHENSIVE METABOLIC PANEL
ALBUMIN: 4.2 g/dL (ref 3.5–5.2)
ALT: 20 U/L (ref 0–35)
ANION GAP: 16 — AB (ref 5–15)
AST: 18 U/L (ref 0–37)
Alkaline Phosphatase: 102 U/L (ref 39–117)
BILIRUBIN TOTAL: 0.7 mg/dL (ref 0.3–1.2)
BUN: 15 mg/dL (ref 6–23)
CHLORIDE: 103 meq/L (ref 96–112)
CO2: 20 mEq/L (ref 19–32)
Calcium: 9.6 mg/dL (ref 8.4–10.5)
Creatinine, Ser: 0.71 mg/dL (ref 0.50–1.10)
GFR calc Af Amer: >90 mL/min (ref >90)
GFR calc non Af Amer: 82 mL/min — ABNORMAL LOW (ref >90)
Glucose, Bld: 99 mg/dL (ref 70–99)
Potassium: 3.8 mEq/L (ref 3.7–5.3)
Sodium: 139 mEq/L (ref 137–147)
Total Protein: 7.2 g/dL (ref 6.0–8.3)

## 2013-12-21 LAB — TROPONIN I: Troponin I: <0.3 ng/mL (ref <0.30)

## 2013-12-21 LAB — LIPASE, BLOOD: Lipase: 8 U/L — ABNORMAL LOW (ref 11–59)

## 2013-12-21 MED ORDER — SODIUM CHLORIDE 0.9 % IV SOLN
INTRAVENOUS | Status: DC
  Administered 2013-12-21: 18:00:00 via INTRAVENOUS

## 2013-12-21 MED ORDER — PROMETHAZINE HCL 25 MG/ML IJ SOLN
6.2500 mg | Freq: Once | INTRAMUSCULAR | Status: AC
  Administered 2013-12-21: 6.25 mg via INTRAVENOUS
  Filled 2013-12-21: qty 1

## 2013-12-21 MED ORDER — PROMETHAZINE HCL 12.5 MG RE SUPP: 12.5000 mg | Freq: Three times a day (TID) | RECTAL | Status: DC | PRN

## 2013-12-21 MED ORDER — PROMETHAZINE HCL 12.5 MG RE SUPP: RECTAL | Status: DC

## 2013-12-21 MED ORDER — FAMOTIDINE IN NACL 20-0.9 MG/50ML-% IV SOLN
20.0000 mg | Freq: Once | INTRAVENOUS | Status: AC
  Administered 2013-12-21: 20 mg via INTRAVENOUS
  Filled 2013-12-21: qty 50

## 2013-12-21 MED ORDER — ONDANSETRON HCL 4 MG/2ML IJ SOLN
4.0000 mg | INTRAMUSCULAR | Status: DC | PRN
  Administered 2013-12-21: 4 mg via INTRAVENOUS
  Filled 2013-12-21: qty 2

## 2013-12-21 NOTE — ED Notes (Addendum)
Pt states she has a "hiatal hernia wrap- and they don't want me to throw up" -- sees Dr. [name] for GI. States has been nauseated since the surgery.   Pt having dry heaves in room,

## 2013-12-21 NOTE — ED Provider Notes (Signed)
CSN: [id number]     Arrival date & time 12/21/13  1553 History   First MD Initiated Contact with Patient 12/21/13 1709     Chief Complaint  Patient presents with  . Nausea     HPI Pt was seen at 1710. Per pt, c/o gradual onset and persistence of constant acute flair of her chronic nausea that began this morning.  Has been associated with acute flair of her chronic upper abd "pain." Pt states she has been taking her zofran without relief.  Denies vomiting/diarrhea, no CP/SOB, no back pain, no fevers, no black or blood in stools.    GI: Dr. [name] Past Medical History  Diagnosis Date  . CAD (coronary artery disease)     LHC 2003 with 40% pLAD, 30% mLAD, 100% D1 (moderate vessel), 100%  D2 (small vessel), 95% mid small OM2.  Pt had PTCA to D1;  D2 and OM2 small vessels and tx medically;  Last Myoview (2012): anterior infarct seen with scar, no ischemia. EF normal. Pt managed medically;  Lexiscan Myoview (12/14):  Low risk; ant scar with very mild peri-infarct ischemia; EF 55% with ant AK   . Recurrent aspiration bronchitis/pneumonia     PFTs 12/09 FVC 97%, FEV1 101%, ratio 77%, TLC 109%. No significant obstruction or restriction.   . Hypertension   . Hyperlipidemia   . Esophageal stricture   . Gastritis   . GERD (gastroesophageal reflux disease)   . Asthma   . Blood transfusion   . Cough   . Wheezing   . Weakness   . Trouble swallowing   . Abdominal pain   . Hiatal hernia   . Shortness of breath   . Anxiety   . TIA (transient ischemic attack)   . BRONCHITIS, ACUTE 07/25/2007         . DIVERTICULOSIS, COLON 08/28/2008    Qualifier: Diagnosis of  By: [name], [name], [name]    . IRON DEFICIENCY ANEMIA, HX OF 07/24/2007    Qualifier: Diagnosis of  By: Tilden Dome    . Chronic nausea   . Chronic back pain   . PONV (postoperative nausea and vomiting)    Past Surgical History  Procedure Laterality Date  . Back surgery    . Bladder repair    . Hemorrhoid surgery    .  Cholecystectomy      Patient unsure of date.  [name] Appendectomy      patient unsure of date  . Total abdominal hysterectomy  1975    partial  . Coronary angioplasty  11/16/2001  . Eye surgery  11/2000    bilateral cataracts with lens implant  . Hiatal hernia repair  06/03/2011    Procedure: LAPAROSCOPIC REPAIR OF HIATAL HERNIA;  Surgeon: Adin Hector, MD;  Location: [facility];  Service: General;  Laterality: N/A;  . Laparoscopic nissen fundoplication  06/18/4707    Procedure: LAPAROSCOPIC NISSEN FUNDOPLICATION;  Surgeon: Adin Hector, MD;  Location: [facility];  Service: General;  Laterality: N/A;  . Lumbar laminectomy/decompression microdiscectomy Right 06/26/2012    Procedure: LUMBAR LAMINECTOMY/DECOMPRESSION MICRODISCECTOMY;  Surgeon: Jessy Oto, MD;  Location: [facility];  Service: Orthopedics;  Laterality: Right;  RIGHT L4-5 MICRODISCECTOMY  . Esophagogastroduodenoscopy N/A 03/15/2013    Procedure: ESOPHAGOGASTRODUODENOSCOPY (EGD);  Surgeon: Jerene Bears, MD;  Location: [facility];  Service: Endoscopy;  Laterality: N/A;  . Esophageal manometry N/A 03/25/2013    Procedure: ESOPHAGEAL MANOMETRY (EM);  Surgeon: Inda Castle, MD;  Location: [facility];  Service:  Endoscopy;  Laterality: N/A;  . Hiatal hernia repair N/A 04/24/2013    Procedure: LAPAROSCOPIC  REPAIR RECURRENT PARASOPHAGEAL HIATAL HERNIA WITH FUNDOPLICATION;  Surgeon: Adin Hector, MD;  Location: [facility];  Service: General;  Laterality: N/A;  . Insertion of mesh N/A 04/24/2013    Procedure: INSERTION OF MESH;  Surgeon: Adin Hector, MD;  Location: [facility];  Service: General;  Laterality: N/A;  . Laparoscopic lysis of adhesions N/A 04/24/2013    Procedure: LAPAROSCOPIC LYSIS OF ADHESIONS;  Surgeon: Adin Hector, MD;  Location: [facility];  Service: General;  Laterality: N/A;  . Fixation kyphoplasty lumbar spine  X 2    "L3-4"   Family History  Problem Relation Age of Onset  . Cancer Mother     colon   . Colon cancer  Mother 89  . Heart disease Father 76    heart attack   History  Substance Use Topics  . Smoking status: Never Smoker   . Smokeless tobacco: Never Used  . Alcohol Use: No    Review of Systems ROS: Statement: All systems negative except as marked or noted in the HPI; Constitutional: Negative for fever and chills. ; ; Eyes: Negative for eye pain, redness and discharge. ; ; ENMT: Negative for ear pain, hoarseness, nasal congestion, sinus pressure and sore throat. ; ; Cardiovascular: Negative for chest pain, palpitations, diaphoresis, dyspnea and peripheral edema. ; ; Respiratory: Negative for cough, wheezing and stridor. ; ; Gastrointestinal: +nausea, abd pain. Negative for vomiting, diarrhea, blood in stool, hematemesis, jaundice and rectal bleeding. . ; ; Genitourinary: Negative for dysuria, flank pain and hematuria. ; ; Musculoskeletal: Negative for back pain and neck pain. Negative for swelling and trauma.; ; Skin: Negative for pruritus, rash, abrasions, blisters, bruising and skin lesion.; ; Neuro: Negative for headache, lightheadedness and neck stiffness. Negative for weakness, altered level of consciousness , altered mental status, extremity weakness, paresthesias, involuntary movement, seizure and syncope.        Allergies  Aspartame; Daliresp; Hydrocodone; Levofloxacin; Tramadol; and Ace inhibitors  Home Medications   Prior to Admission medications   Medication Sig Start Date End Date Taking? Authorizing Provider  Aclidinium Bromide (TUDORZA PRESSAIR) 400 MCG/ACT AEPB Inhale 1 puff into the lungs 2 (two) times daily as needed (wheezing).    Yes Historical Provider, MD  ALPRAZolam Duanne Moron) 0.5 MG tablet Take 1 tablet (0.5 mg total) by mouth 3 (three) times daily as needed for sleep or anxiety. 09/20/13  Yes [name], MD  amLODipine (NORVASC) 10 MG tablet Take 10 mg by mouth daily. For blood pressure   Yes Historical Provider, MD  Cholecalciferol (VITAMIN D3) 2000 UNITS TABS Take  6,000 Units by mouth daily.    Yes Historical Provider, MD  feeding supplement, GLUCERNA SHAKE, (GLUCERNA SHAKE) LIQD Take 237 mLs by mouth 3 (three) times daily between meals. 12/05/13  Yes Shanker Kristeen Mans, MD  gabapentin (NEURONTIN) 300 MG capsule Take 1 capsule (300 mg total) by mouth 3 (three) times daily. 07/08/13 01/08/15 Yes [name], MD  losartan (COZAAR) 100 MG tablet Take 100 mg by mouth daily.   Yes Historical Provider, MD  metoCLOPramide (REGLAN) 10 MG tablet Take 5 mg by mouth 3 (three) times daily before meals. 12/05/13  Yes Shanker Kristeen Mans, MD  metoprolol tartrate (LOPRESSOR) 25 MG tablet Take 12.5 mg by mouth at bedtime.   Yes Historical Provider, MD  mometasone-formoterol (DULERA) 200-5 MCG/ACT AERO Inhale 2 puffs into the lungs 2 (two) times daily. 07/03/13  Yes Melissa R Smith, PA-C  ondansetron (ZOFRAN ODT) 8 MG disintegrating tablet Take 1 tablet (8 mg total) by mouth every 8 (eight) hours as needed for nausea or vomiting. For nausea 09/20/13 09/21/14 Yes [name], MD  OVER THE COUNTER MEDICATION Take 1,000 mg by mouth daily. Coral Calcium 1000mg    Yes Historical Provider, MD  OVER THE COUNTER MEDICATION Take 400 mg by mouth 3 (three) times daily. Magnesium Carbonate 400mg    Yes Historical Provider, MD  polyethylene glycol (MIRALAX / GLYCOLAX) packet Take 17 g by mouth 2 (two) times daily.   Yes Historical Provider, MD  senna (SENOKOT) 8.6 MG TABS tablet Take 2 tablets by mouth at bedtime. 12/05/13  Yes Shanker Kristeen Mans, MD  traMADol (ULTRAM) 50 MG tablet Take 50 mg by mouth every 6 (six) hours as needed for moderate pain.   Yes Historical Provider, MD   BP 141/74  Pulse 76  Temp(Src) 97.8 F (36.6 C) (Oral)  Resp 16  SpO2 98% Physical Exam 1715: Physical examination:  Nursing notes reviewed; Vital signs and O2 SAT reviewed;  Constitutional: Well developed, Well nourished, In no acute distress; Head:  Normocephalic, atraumatic; Eyes: EOMI, PERRL, No scleral  icterus; ENMT: Mouth and pharynx normal, Mucous membranes dry; Neck: Supple, Full range of motion, No lymphadenopathy; Cardiovascular: Regular rate and rhythm, No gallop; Respiratory: Breath sounds clear & equal bilaterally, No rales, rhonchi, wheezes.  Speaking full sentences with ease, Normal respiratory effort/excursion; Chest: Nontender, Movement normal; Abdomen: Soft, +mid-epigastric tenderness to palp. No rebound or guarding. Nondistended, Normal bowel sounds; Genitourinary: No CVA tenderness; Extremities: Pulses normal, No tenderness, No edema, No calf edema or asymmetry.; Neuro: AA&Ox3, Major CN grossly intact.  Speech clear. No gross focal motor or sensory deficits in extremities.; Skin: Color normal, Warm, Dry.   ED Course  Procedures      EKG Interpretation   Date/Time:  Saturday December 21 2013 18:02:50 EDT Ventricular Rate:  76 PR Interval:    QRS Duration: 140 QT Interval:  465 QTC Calculation: 523 R Axis:   98 Text Interpretation:  Poor data quality Artifact Baseline wander Normal  sinus rhythm Nonspecific intraventricular conduction delay When compared  with ECG of 12/03/2013 No significant change was found Confirmed by  [facility]  MD, Nunzio Cory [phone]) on 12/21/2013 6:29:53 PM      MDM  MDM Reviewed: previous chart, nursing note and vitals Reviewed previous: labs and ECG Interpretation: labs, ECG and x-ray   Results for orders placed during the hospital encounter of 12/21/13  CBC WITH DIFFERENTIAL      Result Value Ref Range   WBC 6.4  4.0 - 10.5 K/uL   RBC 4.56  3.87 - 5.11 MIL/uL   Hemoglobin 13.7  12.0 - 15.0 g/dL   HCT 40.4  36.0 - 46.0 %   MCV 88.6  78.0 - 100.0 fL   MCH 30.0  26.0 - 34.0 pg   MCHC 33.9  30.0 - 36.0 g/dL   RDW 14.8  11.5 - 15.5 %   Platelets 331  150 - 400 K/uL   Neutrophils Relative % 63  43 - 77 %   Neutro Abs 4.1  1.7 - 7.7 K/uL   Lymphocytes Relative 25  12 - 46 %   Lymphs Abs 1.6  0.7 - 4.0 K/uL   Monocytes Relative 9  3 - 12 %    Monocytes Absolute 0.6  0.1 - 1.0 K/uL   Eosinophils Relative 2  0 - 5 %  Eosinophils Absolute 0.1  0.0 - 0.7 K/uL   Basophils Relative 1  0 - 1 %   Basophils Absolute 0.0  0.0 - 0.1 K/uL  COMPREHENSIVE METABOLIC PANEL      Result Value Ref Range   Sodium 139  137 - 147 mEq/L   Potassium 3.8  3.7 - 5.3 mEq/L   Chloride 103  96 - 112 mEq/L   CO2 20  19 - 32 mEq/L   Glucose, Bld 99  70 - 99 mg/dL   BUN 15  6 - 23 mg/dL   Creatinine, Ser 0.71  0.50 - 1.10 mg/dL   Calcium 9.6  8.4 - 10.5 mg/dL   Total Protein 7.2  6.0 - 8.3 g/dL   Albumin 4.2  3.5 - 5.2 g/dL   AST 18  0 - 37 U/L   ALT 20  0 - 35 U/L   Alkaline Phosphatase 102  39 - 117 U/L   Total Bilirubin 0.7  0.3 - 1.2 mg/dL   GFR calc non Af Amer 82 (*) >90 mL/min   GFR calc Af Amer >90  >90 mL/min   Anion gap 16 (*) 5 - 15  LIPASE, BLOOD      Result Value Ref Range   Lipase 8 (*) 11 - 59 U/L  TROPONIN I      Result Value Ref Range   Troponin I <0.30  <0.30 ng/mL   Ct Abdomen Pelvis W Contrast 12/03/2013   CLINICAL DATA:  Scan.  Back pain.  Abnormal LFTs.  EXAM: CT ABDOMEN AND PELVIS WITH CONTRAST  TECHNIQUE: Multidetector CT imaging of the abdomen and pelvis was performed using the standard protocol following bolus administration of intravenous contrast.  CONTRAST:  24mL OMNIPAQUE IOHEXOL 300 MG/ML  SOLN  COMPARISON:  Abdomen series 7/28 /2015.  CT 09/27/2013.  FINDINGS: No focal hepatic abnormality. Punctate stable lucency in the medial portion of the liver is unchanged consistent stable 4 mm cyst. Spleen is normal. Pancreas is normal. No biliary distention.  Adrenals normal. Punctate cyst anterior aspect midportion left kidney. This is unchanged. Kidneys are otherwise normal. No hydronephrosis or obstructing ureteral stone. The bladder is nondistended. Hysterectomy. No free pelvic fluid.  No significant inguinal or retroperitoneal adenopathy. Abdominal aorta normal in caliber. Visceral vessels are patent. Renal atherosclerotic  vascular disease.  No inflammatory change in right or left lower quadrant. Large amount of stool is present colon. No bowel distention. No free air. No mesenteric mass. No evidence of hernia. Stable changes of prior hiatal hernia surgery. If further evaluation is needed of the surgical site of the hiatal hernia repair, upper GI can be performed. Stomach is nondistended.  Lung bases are clear. Heart size normal. Multiple lumbar compression fractures with postsurgical change from prior vertebroplasty. Methylmethacrylate noted in the L2-3 disc space.  IMPRESSION: 1. No acute abnormality. 2. Postsurgical changes of a hiatal hernial repair, stable from prior CT of 09/27/2013. No bowel distention. No gastric distention. 3. Multiple lumbar compression fractures with postsurgical change, stable.   Electronically Signed   By: Marcello Moores  Register   On: 12/03/2013 15:28   Dg Abd Acute W/chest 12/21/2013   CLINICAL DATA:  Nausea and vomiting for 1 day.  EXAM: ACUTE ABDOMEN SERIES (ABDOMEN 2 VIEW & CHEST 1 VIEW)  COMPARISON:  Chest in two views abdomen 12/03/2013. CT chest 11/20/2013.  FINDINGS: Single view of the chest demonstrates clear lungs and normal heart size. No pneumothorax or pleural effusion.  Two views of the abdomen show  no free intraperitoneal air. The bowel gas pattern is nonobstructive. The patient is status post vertebral augmentation at L3 and L4.  IMPRESSION: No acute finding chest or abdomen.  Stable compared to prior exam.   Electronically Signed   By: Inge Rise M.D.   On: 12/21/2013 19:31   US Abdomen Limited Ruq 12/03/2013   CLINICAL DATA:  Increased LFTs.  Abdominal pain.  Nausea.  EXAM: US ABDOMEN LIMITED - RIGHT UPPER QUADRANT  COMPARISON:  CT 12/03/2013  FINDINGS: Gallbladder:  Surgically absent  Common bile duct:  Diameter: 7 mm  Liver:  Within normal limits in parenchymal echogenicity. Sub cm hypoechoic lesion within the hepatic parenchyma, right hepatic lobe, too small to characterize.   IMPRESSION: Common bile duct measures 7 mm.  Status post cholecystectomy.   Electronically Signed   By: Lovey Newcomer M.D.   On: 12/03/2013 19:50    2220:  Pt has tol PO well while in the ED without N/V.  No stooling while in the ED.  Abd benign, VSS. Pt states she feels better and wants to go home now. Workup reassuring. Dx and testing d/w pt and family.  Questions answered.  Verb understanding, agreeable to d/c home with outpt f/u.      Francine Graven, DO 12/24/13 1750

## 2013-12-21 NOTE — ED Notes (Addendum)
Per pt sts she began to feel nauseous since this am. sts a little bit of abdominal pain. Pt sts that she took a zofran with no relief.

## 2013-12-21 NOTE — ED Notes (Signed)
States is still nauseated.

## 2013-12-21 NOTE — ED Notes (Signed)
Patient was able to tolerate PO's. EDP made aware.

## 2013-12-21 NOTE — Discharge Instructions (Signed)
°Emergency Department Resource Guide °1) Find a Doctor and Pay Out of Pocket °Although you won't have to find out who is covered by your insurance plan, it is a good idea to ask around and get recommendations. You will then need to call the office and see if the doctor you have chosen will accept you as a new patient and what types of options they offer for patients who are self-pay. Some doctors offer discounts or will set up payment plans for their patients who do not have insurance, but you will need to ask so you aren't surprised when you get to your appointment. ° °2) Contact Your Local Health Department °Not all health departments have doctors that can see patients for sick visits, but many do, so it is worth a call to see if yours does. If you don't know where your local health department is, you can check in your phone book. The CDC also has a tool to help you locate your state's health department, and many state websites also have listings of all of their local health departments. ° °3) Find a Walk-in Clinic °If your illness is not likely to be very severe or complicated, you may want to try a walk in clinic. These are popping up all over the country in pharmacies, drugstores, and shopping centers. They're usually staffed by nurse practitioners or physician assistants that have been trained to treat common illnesses and complaints. They're usually fairly quick and inexpensive. However, if you have serious medical issues or chronic medical problems, these are probably not your best option. ° °No Primary Care Doctor: °- Call [facility] at  832-8000 - they can help you locate a primary care doctor that  accepts your insurance, provides certain services, etc. °- Physician Referral Service- 1-800-533-3463 ° °Chronic Pain Problems: °Organization         Address  Phone   Notes  °[facility]  (336) 297-2271 Patients need to be referred by their primary care doctor.  ° °Medication  Assistance: °Organization         Address  Phone   Notes  °[place] [facility] [address] °[place], [place] [address] (336) 641-8030 --Must be a resident of [place] °-- Must have NO insurance coverage whatsoever (no Medicaid/ Medicare, etc.) °-- The pt. MUST have a primary care doctor that directs their care regularly and follows them in the community °  °[facility]  (866) 331-1348   °[employer]  (888) 892-1162   ° °Agencies that provide inexpensive medical care: °Organization         Address  Phone   Notes  °[facility]  (336) 832-8035   °[facility]    (336) 832-7272   °[facility] [address] °[place], [place] [address] (336) 832-4777   °[facility] [address], °[place] (336) 271-4999   °[facility]    (336) 373-0678   °[facility]    (336) 272-1050   °[facility] ° [address], [place] Phone:  (336) 832-4444, Fax:  (336) 832-4440 Hours of Operation:  9 am - 6 pm, M-F.  Also accepts Medicaid/Medicare and self-pay.  °[facility] ° [address], [place] Phone: (336) [phone], Fax: (336) [phone]. Hours of Operation:  8:30 am - 5:30 pm, M-F.  Also accepts Medicaid and self-pay.  °[facility] [place] [address], [place] Phone: (336) 878-6027   °[facility] [address], [place], [place] (336)723-1848, Ext. 123 Mondays & Thursdays: 7-9 AM.  First 15 patients are seen on a first come, first serve basis. °  ° °Medicaid-accepting [place] Providers: ° °Organization         Address  Phone   Notes  °[facility] [address], [place] (336) 641-2100 Also accepts self-pay patients.  °[facility] [address], [place] ° (336) 856-9996   °[facility] [address], [place]  (336) 288-8857   °[facility] [address] [place] [address], [place] (336) 299-7000   °Veita Bland [address], [place]  ° (336) 373-1557 Only accepts [place] Access Medicaid patients after they have their name applied to their card.  ° °Self-Pay (no insurance) in [place]: ° °Organization         Address  Phone   Notes  °Sickle Cell Patients, [facility] [address], [place] (336) 832-1970   °[facility] [address], [place] (336) 832-4400   °[facility] ° [address], [place] (336) 992-4800   °[facility]/Dr. Osei-Bonsu ° [address] [place] [address], [place] or [address], [place] (336) [phone] Phone number for both [place] and [place] locations is the same.  °[facility] [address] [place] [address], [place] (336) 299-0000   °[facility] [address] [place] [address], [place] or [address] (336) 852-7530 °(336) 878-2260   °[facility] [address], [place] (336) 350-1642, phone; (336) 294-5005, fax Sees patients 1st and 3rd Saturday of every month.  Must not qualify for public or private insurance (i.e. Medicaid, Medicare, [place] Health Choice, Veterans' Benefits) • Household income should be no more than 200% of the poverty level •The clinic cannot treat you if you are pregnant or think you are pregnant • Sexually transmitted diseases are not treated at the clinic.  ° ° °Dental Care: °Organization         Address  Phone  Notes  °[place] [facility] [address], [place] (336) 641-6152 Accepts children up to age 21 who are enrolled in Medicaid or [place] Health Choice; pregnant women with a Medicaid card; and children who have applied for Medicaid or [place] Health Choice, but were declined, whose parents can pay a reduced fee at time of service.  °[place]  [facility] [place]  [address], [place] (336) 641-7733 Accepts children up to age 21 who are enrolled in Medicaid or [place] Health Choice; pregnant women with a Medicaid card; and children who have applied for Medicaid or [place] Health Choice, but were declined, whose parents can pay a reduced fee at time of service.  °[facility] ° [address],  (336) 641-4533 Patients are seen by appointment only. Walk-ins are not accepted. [facility] will see patients 18 years of age and older. °Monday - Tuesday (8am-5pm) °Most Wednesdays (8:30-5pm) °$30 per visit, cash only  °[facility] ° [address], [place] (336) 641-4533 Patients are seen by appointment only. Walk-ins are not accepted. [facility] will see patients 18 years of age and older. °One   Wednesday Evening (Monthly: Volunteer Based).  $30 per visit, cash only  °[facility]  (919) [phone] for adults; Children under age 4, call Graduate Pediatric Dentistry at ([phone]) [phone]. Children aged 4-14, please call ([phone]) [phone] to request a pediatric application. ° Dental services are provided in all areas of dental care including fillings, crowns and bridges, complete and partial dentures, implants, gum treatment, root canals, and extractions. Preventive care is also provided. Treatment is provided to both adults and children. °Patients are selected via a lottery and there is often a waiting list. °  °[facility] [address], °[place] ° (336) 763-8833 [email or web address].[email or web address] °  °[facility] [address], [place], [place] (336)723-1848, Ext. 123 Second and Fourth Thursday of each month, opens at 6:30 AM; Clinic ends at 9 AM.  Patients are seen on a first-come first-served basis, and a limited number are seen during each clinic.  ° °[facility] ° [address], [place], [place] (336) [phone]    Eligibility Requirements °You must have lived in [place], [place], or [place] counties for at least the last three months. °  You cannot be eligible for state or federal sponsored healthcare insurance, including Veterans Administration, Medicaid, or Medicare. °  You generally cannot be eligible for healthcare insurance through your employer.  °  How to apply: °Eligibility screenings are held every Tuesday and Wednesday afternoon from 1:00 pm until 4:00 pm. You do not need an appointment for the interview!  °[facility] [address], [place], [place] 336-631-2330   °[facility]  336-342-8273   °[facility]  336-703-3100   °[facility]  336-570-6415   ° °Behavioral Health Resources in the Community: °Intensive Outpatient Programs °Organization         Address  Phone  Notes  °[facility] [address], [place], [place] 336-878-6098   °[facility] [address], [place], [place] 336-832-9800   °[facility] [address], [place], [place] ° 336-882-2125   °[facility] [address],  °[place], [place] 1-800-853-5163 or 336-641-4981   °Substance Abuse Resources °Organization         Address  Phone  Notes  °[facility]  336-882-2125   °[facility]  336-784-9470   °[facility]  336-285-9073   °[facility]  336-845-3988   °Residential & Outpatient Substance Abuse Program  1-800-659-3381   °Psychological Services °Organization         Address  Phone  Notes  °[facility]  336- 832-9600   °[facility]  336- 378-7881   °[facility] [address], [place] 1-800-853-5163 or 336-641-4981   ° °Mobile Crisis Teams °Organization         Address  Phone  Notes  °[facility], Mobile Crisis Care Unit  1-877-626-1772   °Assertive °[facility] ° [address]  [place], [place] 336-834-9664   °[name] DeEsch [address] °[place] 336-554-5454   ° °Self-Help/Support Groups °Organization         Address  Phone             Notes  °[facility] [place] - variety of support groups  336- 373-1402 Call for more information  °Narcotics Anonymous (NA), [facility] [address], °[place]  2 meetings at this location  ° °  Residential Treatment Programs Organization         Address  Phone  Notes  [facility] [address]    [place]  1-(514)504-6365   [facility]  [address] [place] [address], [place], [place]   [place], [place] [phone] Admissions: 8am-3pm M-F  [facility] [place] [address]    [place], [place] 751-700-1749   [facility] [address] [place], [place], [place]   [facility] [address],  [place], [place]   [facility] Intensive Outpatient [address], [place], [place]   [facility] [place]  [place], [place] 1-810-800-8882 or 320-761-9451   [facility] [address], [place], [place] Accepts Medicaid  [facility] [address],  [place] 1-331-368-0691 Substance Abuse/Addiction Treatment   [facility]         Address  Phone  Notes  [facility]  878-195-4930   Domenic Schwab, PhD [address] [place], [place]   516-397-1662 or 272-545-3910   [facility] [place], [place] [phone]   [facility] [address] [place], [place] (316) [phone] Insurance/Medicaid/sponsorship through [facility] [address]                                    [place], [place] [phone] [place] [address] [place], [place] ([phone]    Dr. Adele Schilder  [phone]   [facility] Dept. 1) [address] [place] 2) [place] 3)  [address], [place] ([phone]) [phone]  [phone]   [place] (660)465-6732 or [phone] (After Hours)       Take the prescription as directed. Increase your fluid intake (ie:  Gatoraide) for the next few days.  Eat a bland diet and advance to your regular diet slowly as you can tolerate it. Call your regular GI doctor on Monday to schedule a follow up appointment within the next 3 days.  Return to the Emergency Department immediately sooner if worsening.

## 2013-12-23 ENCOUNTER — Telehealth: Payer: Self-pay | Admitting: Gastroenterology

## 2013-12-23 NOTE — Telephone Encounter (Signed)
Patient has been in the hospital twice in less than 30 days for nausea. Notes indicate there is concern for PBS. The last visit note states GI consult. Appointment is scheduled for October. Spoke with the patient. She is still nauseated but not vomiting. Appointment scheduled for 12/25/13 with Dr [name] to evaluate.

## 2013-12-24 ENCOUNTER — Other Ambulatory Visit: Payer: Self-pay

## 2013-12-24 ENCOUNTER — Encounter: Payer: Self-pay | Admitting: Internal Medicine

## 2013-12-24 ENCOUNTER — Ambulatory Visit (INDEPENDENT_AMBULATORY_CARE_PROVIDER_SITE_OTHER): Payer: Medicare HMO | Admitting: Internal Medicine

## 2013-12-24 VITALS — BP 122/58 | HR 80 | Temp 98.1°F | Resp 16 | Wt 125.8 lb

## 2013-12-24 DIAGNOSIS — R110 Nausea: Secondary | ICD-10-CM

## 2013-12-24 MED ORDER — TRAMADOL HCL 50 MG PO TABS: 50.0000 mg | ORAL_TABLET | Freq: Four times a day (QID) | ORAL | Status: DC | PRN

## 2013-12-24 MED ORDER — PROCHLORPERAZINE MALEATE 10 MG PO TABS: ORAL_TABLET | ORAL | Status: DC

## 2013-12-24 NOTE — Progress Notes (Signed)
Subjective:    Patient ID: [name] [name], female    DOB: [birth year], 78 y.o.   MRN: [id number]  HPI Patient had repair of a large paraesophageal hernia in Dec 2014 and reports constant nausea since waxing & waning in severity . She has had 6-8 ER visits since then. She was just seen yesterday in the ER and released after IVF and IV meds. Today she reports ongoing Nausea  And denies any water brash or reflux Sx's. She specifically denies "heartburn" , but reports an ache in the epigastric area.    Medication List   ALPRAZolam 0.5 MG tablet  Commonly known as:  XANAX  Take 1 tablet (0.5 mg total) by mouth 3 (three) times daily as needed for sleep or anxiety.     amLODipine 10 MG tablet  Commonly known as:  NORVASC  Take 10 mg by mouth daily. For blood pressure     feeding supplement (GLUCERNA SHAKE) Liqd  Take 237 mLs by mouth 3 (three) times daily between meals.     gabapentin 300 MG capsule  Commonly known as:  NEURONTIN  Take 1 capsule (300 mg total) by mouth 3 (three) times daily.     losartan 100 MG tablet  Commonly known as:  COZAAR  Take 100 mg by mouth daily.     metoCLOPramide 10 MG tablet  Commonly known as:  REGLAN  Take 5 mg by mouth 3 (three) times daily before meals.     metoprolol tartrate 25 MG tablet  Commonly known as:  LOPRESSOR  Take 12.5 mg by mouth at bedtime.     mometasone-formoterol 200-5 MCG/ACT Aero  Commonly known as:  DULERA  Inhale 2 puffs into the lungs 2 (two) times daily.     ondansetron 8 MG disintegrating tablet  Commonly known as:  ZOFRAN ODT  Take 1 tablet (8 mg total) by mouth every 8 (eight) hours as needed for nausea or vomiting. For nausea     OVER THE COUNTER MEDICATION  Take 1,000 mg by mouth daily. [place] Calcium 1000mg      OVER THE COUNTER MEDICATION  Take 400 mg by mouth 3 (three) times daily. Magnesium Carbonate 400mg      polyethylene glycol packet  Commonly known as:  MIRALAX / GLYCOLAX  Take 17 g by mouth 2 (two) times  daily.     prochlorperazine 10 MG tablet  Commonly known as:  COMPAZINE  Take 1 tablet 3 x day before meals     promethazine 12.5 MG suppository  Commonly known as:  PHENERGAN  One half or one supp PR q8h prn nausea/vomiting     senna 8.6 MG Tabs tablet  Commonly known as:  SENOKOT  Take 2 tablets by mouth at bedtime.     traMADol 50 MG tablet  Commonly known as:  ULTRAM  Take 1 tablet (50 mg total) by mouth every 6 (six) hours as needed for moderate pain.     TUDORZA PRESSAIR 400 MCG/ACT Aepb  Generic drug:  Aclidinium Bromide  Inhale 1 puff into the lungs 2 (two) times daily as needed (wheezing).     Vitamin D3 2000 UNITS Tabs  Take 6,000 Units by mouth daily.       Allergies  Allergen Reactions  . Aspartame Diarrhea and Nausea And Vomiting  . Daliresp [Roflumilast] Nausea And Vomiting  . Hydrocodone Nausea Only  . Levofloxacin Nausea And Vomiting  . Tramadol Other (See Comments)    Blurred vision   . Ace Inhibitors Cough  Per Dr Melvyn Novas pulmonology 2013    Past Medical History  Diagnosis Date  . CAD (coronary artery disease)     LHC 2003 with 40% pLAD, 30% mLAD, 100% D1 (moderate vessel), 100%  D2 (small vessel), 95% mid small OM2.  Pt had PTCA to D1;  D2 and OM2 small vessels and tx medically;  Last Myoview (2012): anterior infarct seen with scar, no ischemia. EF normal. Pt managed medically;  Lexiscan Myoview (12/14):  Low risk; ant scar with very mild peri-infarct ischemia; EF 55% with ant AK   . Recurrent aspiration bronchitis/pneumonia     PFTs 12/09 FVC 97%, FEV1 101%, ratio 77%, TLC 109%. No significant obstruction or restriction.   . Hypertension   . Hyperlipidemia   . Esophageal stricture   . Gastritis   . GERD (gastroesophageal reflux disease)   . Asthma   . Blood transfusion   . Cough   . Wheezing   . Weakness   . Trouble swallowing   . Abdominal pain   . Hiatal hernia   . Shortness of breath   . Anxiety   . TIA (transient ischemic attack)   .  BRONCHITIS, ACUTE 07/25/2007         . DIVERTICULOSIS, COLON 08/28/2008    Qualifier: Diagnosis of  By: [name], [name], [name]    . IRON DEFICIENCY ANEMIA, HX OF 07/24/2007    Qualifier: Diagnosis of  By: Tilden Dome    . Chronic nausea   . Chronic back pain   . PONV (postoperative nausea and vomiting)    Past Surgical History  Procedure Laterality Date  . Back surgery    . Bladder repair    . Hemorrhoid surgery    . Cholecystectomy      Patient unsure of date.  [name] Appendectomy      patient unsure of date  . Total abdominal hysterectomy  1975    partial  . Coronary angioplasty  11/16/2001  . Eye surgery  11/2000    bilateral cataracts with lens implant  . Hiatal hernia repair  06/03/2011    Procedure: LAPAROSCOPIC REPAIR OF HIATAL HERNIA;  Surgeon: Adin Hector, MD;  Location: [facility];  Service: General;  Laterality: N/A;  . Laparoscopic nissen fundoplication  3/82/5053    Procedure: LAPAROSCOPIC NISSEN FUNDOPLICATION;  Surgeon: Adin Hector, MD;  Location: [facility];  Service: General;  Laterality: N/A;  . Lumbar laminectomy/decompression microdiscectomy Right 06/26/2012    Procedure: LUMBAR LAMINECTOMY/DECOMPRESSION MICRODISCECTOMY;  Surgeon: Jessy Oto, MD;  Location: [facility];  Service: Orthopedics;  Laterality: Right;  RIGHT L4-5 MICRODISCECTOMY  . Esophagogastroduodenoscopy N/A 03/15/2013    Procedure: ESOPHAGOGASTRODUODENOSCOPY (EGD);  Surgeon: Jerene Bears, MD;  [facility];  Service: Endoscopy;  Laterality: N/A;  . Esophageal manometry N/A 03/25/2013    Procedure: ESOPHAGEAL MANOMETRY (EM);  Surgeon: Inda Castle, MD;  [facility];  Service: Endoscopy;  Laterality: N/A;  . Hiatal hernia repair N/A 04/24/2013    Procedure: LAPAROSCOPIC  REPAIR RECURRENT PARASOPHAGEAL HIATAL HERNIA WITH FUNDOPLICATION;  Surgeon: Adin Hector, MD;  [facility];  Service: General;  Laterality: N/A;  . Insertion of mesh N/A 04/24/2013    Procedure: INSERTION OF  MESH;  Surgeon: Adin Hector, MD;  [facility]: [facility];  Service: General;  Laterality: N/A;  . Laparoscopic lysis of adhesions N/A 04/24/2013    Procedure: LAPAROSCOPIC LYSIS OF ADHESIONS;  Surgeon: Adin Hector, MD;  [facility];  Service: General;  Laterality: N/A;  .  Fixation kyphoplasty lumbar spine  X 2    "L3-4"   Review of Systems  In addition to the HPI above,  No Fever-chills,  No Headache, No changes with Vision or hearing,  No Chest pain or productive Cough or Shortness of Breath,  Bowel movements are regular,  No Blood in stool or Urine,  No dysuria,  No new skin rashes or bruises,  No new joints pains-aches,  No new weakness, tingling, numbness in any extremity,  No polyuria, polydypsia or polyphagia,  No significant Mental Stressors.  A full 10 point Review of Systems was done, except as stated above, all other Review of Systems were negative  Objective:   Physical Exam  BP 122/58  Pulse 80  Temp(Src) 98.1 F (36.7 C)  Resp 16  Wt 125 lb 12.8 oz (57.063 kg)  HEENT - Eac's patent. TM's Nl. EOM's full. PERRLA. NasoOroPharynx clear. Neck - supple. Nl Thyroid. Carotids 2+ & No bruits, nodes, JVD Chest - Clear equal BS w/o Rales, rhonchi, wheezes. Cor - Nl HS. RRR w/o sig MGR. PP 1(+). No edema. Abd - there is tenderness in the EG area greater than in the lower quadrants. Soft w/o guarding or rebound & BS are normal. MS- FROM w/o deformities. Muscle power, tone and bulk Nl. Gait Nl. Neuro - No obvious Cr N abnormalities. Sensory, motor and Cerebellar functions appear Nl w/o focal abnormalities. Psyche - Mental status normal & appropriate.  No delusions, ideations or obvious mood abnormalities.  Assessment & Plan:   1. Nausea - possible gastritis vs ulcer Dz. Will begin Compazine 10 mg tid ac as she is failing on her current regimen. Also, emperically start Omeprazole 40 mg bid until she gets in to see Dr Deatra [name].

## 2013-12-24 NOTE — Patient Instructions (Signed)

## 2013-12-25 ENCOUNTER — Encounter: Payer: Self-pay | Admitting: Gastroenterology

## 2013-12-25 ENCOUNTER — Ambulatory Visit (INDEPENDENT_AMBULATORY_CARE_PROVIDER_SITE_OTHER): Payer: Medicare HMO | Admitting: Gastroenterology

## 2013-12-25 VITALS — BP 114/68 | HR 72 | Ht 63.0 in | Wt 126.1 lb

## 2013-12-25 DIAGNOSIS — R7402 Elevation of levels of lactic acid dehydrogenase (LDH): Secondary | ICD-10-CM

## 2013-12-25 DIAGNOSIS — R740 Nonspecific elevation of levels of transaminase and lactic acid dehydrogenase [LDH]: Secondary | ICD-10-CM

## 2013-12-25 DIAGNOSIS — R110 Nausea: Secondary | ICD-10-CM

## 2013-12-25 DIAGNOSIS — R7401 Elevation of levels of liver transaminase levels: Secondary | ICD-10-CM

## 2013-12-25 NOTE — Assessment & Plan Note (Signed)
Transaminases have normalized.  CT scan was negative.  Doubt underlying liver disease.

## 2013-12-25 NOTE — Assessment & Plan Note (Signed)
Nausea continues.  She has lost 50 pounds over the past year, initially 2 to reflux and dysphagia and now from nausea.  She takes tramadol several times a day which could be responsible for her symptoms.  A CNS cause for nausea is also a consideration.  Mechanical obstruction is much less likely.  Last gastric emptying scan was normal.  Patient feels better with Reglan.  Recommendations #1 hold tramadol for one week then reassess; if not improved would consider CT of the head

## 2013-12-25 NOTE — Progress Notes (Signed)
      History of Present Illness:  Ms. [name] [name] continues to complain of nausea.  This occurs throughout the day and night and has not affected by eating.  She has occasional mild periumbilical discomfort.  CT of the abdomen was unrevealing.  She believes that symptoms are slightly improved with Reglan.  She has had multiple ER visits because of nausea.  She has lost almost 50 pounds in the past year.  Esophageal manometry was normal except for occasional spasm.  She is very mild, intermittent dysphagia to solids.   Review of Systems: Pertinent positive and negative review of systems were noted in the above HPI section. All other review of systems were otherwise negative.    Current Medications, Allergies, Past Medical History, Past Surgical History, Family History and Social History were reviewed in [place] record  Vital signs were reviewed in today's medical record. Physical Exam: General: Thin female in no acute distress Skin: anicteric There is no succussion splash.  Abdomen is without masses, tenderness organomegaly Neurological: Alert oriented x 4, grossly nonfocal Psychological:  Alert and cooperative. Normal mood and affect  See Assessment and Plan under Problem List

## 2013-12-25 NOTE — Patient Instructions (Signed)
Stop Tramadol Call back to report progress in one week

## 2013-12-26 ENCOUNTER — Other Ambulatory Visit: Payer: Self-pay | Admitting: Emergency Medicine

## 2014-01-03 ENCOUNTER — Telehealth: Payer: Self-pay | Admitting: Gastroenterology

## 2014-01-03 NOTE — Telephone Encounter (Signed)
She reports she still has intermittent dysphagia to solids and her pills. She puts her pills in applesauce and that has solved that problem. She feels she is better off the Tramadol. No nausea. Takes the Reglan before she eats. The follow up appointment is 02/11/14.

## 2014-01-05 NOTE — Telephone Encounter (Signed)
She can try holding reglan

## 2014-01-06 NOTE — Telephone Encounter (Signed)
I have left message for the patient to call back 

## 2014-01-06 NOTE — Telephone Encounter (Signed)
Patient advised.

## 2014-01-09 ENCOUNTER — Emergency Department (HOSPITAL_COMMUNITY): Payer: Medicare HMO

## 2014-01-09 ENCOUNTER — Encounter (HOSPITAL_COMMUNITY): Payer: Self-pay | Admitting: Emergency Medicine

## 2014-01-09 ENCOUNTER — Emergency Department (HOSPITAL_COMMUNITY)
Admission: EM | Admit: 2014-01-09 | Discharge: 2014-01-09 | Disposition: A | Discharge status: Home / Self Care | Payer: Medicare HMO | Attending: Emergency Medicine | Admitting: Emergency Medicine

## 2014-01-09 DIAGNOSIS — I10 Essential (primary) hypertension: Secondary | ICD-10-CM | POA: Diagnosis not present

## 2014-01-09 DIAGNOSIS — K219 Gastro-esophageal reflux disease without esophagitis: Secondary | ICD-10-CM | POA: Insufficient documentation

## 2014-01-09 DIAGNOSIS — R4182 Altered mental status, unspecified: Secondary | ICD-10-CM | POA: Insufficient documentation

## 2014-01-09 DIAGNOSIS — Z8673 Personal history of transient ischemic attack (TIA), and cerebral infarction without residual deficits: Secondary | ICD-10-CM | POA: Diagnosis not present

## 2014-01-09 DIAGNOSIS — T40415A Adverse effect of fentanyl or fentanyl analogs, initial encounter: Secondary | ICD-10-CM

## 2014-01-09 DIAGNOSIS — Z79899 Other long term (current) drug therapy: Secondary | ICD-10-CM | POA: Insufficient documentation

## 2014-01-09 DIAGNOSIS — Z8659 Personal history of other mental and behavioral disorders: Secondary | ICD-10-CM | POA: Insufficient documentation

## 2014-01-09 DIAGNOSIS — Z862 Personal history of diseases of the blood and blood-forming organs and certain disorders involving the immune mechanism: Secondary | ICD-10-CM | POA: Diagnosis not present

## 2014-01-09 DIAGNOSIS — T40605A Adverse effect of unspecified narcotics, initial encounter: Secondary | ICD-10-CM | POA: Diagnosis not present

## 2014-01-09 DIAGNOSIS — I2510 Atherosclerotic heart disease of native coronary artery without angina pectoris: Secondary | ICD-10-CM | POA: Insufficient documentation

## 2014-01-09 DIAGNOSIS — G8929 Other chronic pain: Secondary | ICD-10-CM | POA: Insufficient documentation

## 2014-01-09 DIAGNOSIS — Z8701 Personal history of pneumonia (recurrent): Secondary | ICD-10-CM | POA: Diagnosis not present

## 2014-01-09 DIAGNOSIS — Z8639 Personal history of other endocrine, nutritional and metabolic disease: Secondary | ICD-10-CM | POA: Insufficient documentation

## 2014-01-09 DIAGNOSIS — J45901 Unspecified asthma with (acute) exacerbation: Secondary | ICD-10-CM | POA: Insufficient documentation

## 2014-01-09 DIAGNOSIS — T404X5A Adverse effect of other synthetic narcotics, initial encounter: Secondary | ICD-10-CM

## 2014-01-09 LAB — CBC WITH DIFFERENTIAL/PLATELET
Basophils Absolute: 0 10*3/uL (ref 0.0–0.1)
Basophils Relative: 0 % (ref 0–1)
Eosinophils Absolute: 0 10*3/uL (ref 0.0–0.7)
Eosinophils Relative: 0 % (ref 0–5)
HCT: 40 % (ref 36.0–46.0)
Hemoglobin: 13.8 g/dL (ref 12.0–15.0)
Lymphocytes Relative: 10 % — ABNORMAL LOW (ref 12–46)
Lymphs Abs: 0.7 10*3/uL (ref 0.7–4.0)
MCH: 30.4 pg (ref 26.0–34.0)
MCHC: 34.5 g/dL (ref 30.0–36.0)
MCV: 88.1 fL (ref 78.0–100.0)
Monocytes Absolute: 0.1 10*3/uL (ref 0.1–1.0)
Monocytes Relative: 2 % — ABNORMAL LOW (ref 3–12)
Neutro Abs: 6.1 10*3/uL (ref 1.7–7.7)
Neutrophils Relative %: 88 % — ABNORMAL HIGH (ref 43–77)
Platelets: 322 10*3/uL (ref 150–400)
RBC: 4.54 MIL/uL (ref 3.87–5.11)
RDW: 13.9 % (ref 11.5–15.5)
WBC: 6.9 10*3/uL (ref 4.0–10.5)

## 2014-01-09 LAB — URINALYSIS, ROUTINE W REFLEX MICROSCOPIC
Bilirubin Urine: NEGATIVE
Glucose, UA: NEGATIVE mg/dL
Hgb urine dipstick: NEGATIVE
Ketones, ur: NEGATIVE mg/dL
Leukocytes, UA: NEGATIVE
Nitrite: NEGATIVE
Protein, ur: NEGATIVE mg/dL
Specific Gravity, Urine: 1.023 (ref 1.005–1.030)
Urobilinogen, UA: 0.2 mg/dL (ref 0.0–1.0)
pH: 6.5 (ref 5.0–8.0)

## 2014-01-09 LAB — BASIC METABOLIC PANEL
Anion gap: 15 (ref 5–15)
BUN: 20 mg/dL (ref 6–23)
CO2: 22 mEq/L (ref 19–32)
Calcium: 9.5 mg/dL (ref 8.4–10.5)
Chloride: 103 mEq/L (ref 96–112)
Creatinine, Ser: 0.63 mg/dL (ref 0.50–1.10)
GFR calc Af Amer: >90 mL/min (ref >90)
GFR calc non Af Amer: 85 mL/min — ABNORMAL LOW (ref >90)
Glucose, Bld: 157 mg/dL — ABNORMAL HIGH (ref 70–99)
Potassium: 4.3 mEq/L (ref 3.7–5.3)
Sodium: 140 mEq/L (ref 137–147)

## 2014-01-09 MED ORDER — ONDANSETRON 4 MG PO TBDP
4.0000 mg | ORAL_TABLET | Freq: Once | ORAL | Status: AC
  Administered 2014-01-09: 4 mg via ORAL
  Filled 2014-01-09: qty 1

## 2014-01-09 NOTE — ED Notes (Signed)
The pt is normally  Up and around

## 2014-01-09 NOTE — ED Notes (Signed)
Urine sample at bedside

## 2014-01-09 NOTE — ED Notes (Signed)
The pt has been confused since she woke up  Wednesday 1200n.  She has just been  Started on a fentanyl patch.  She was last normal Wednesday am around 1100am

## 2014-01-09 NOTE — Discharge Instructions (Signed)

## 2014-01-09 NOTE — ED Notes (Signed)
Pt complains about nausea.

## 2014-01-10 ENCOUNTER — Emergency Department (HOSPITAL_COMMUNITY)
Admission: EM | Admit: 2014-01-10 | Discharge: 2014-01-10 | Disposition: A | Discharge status: Home / Self Care | Payer: Medicare HMO | Attending: Emergency Medicine | Admitting: Emergency Medicine

## 2014-01-10 ENCOUNTER — Encounter (HOSPITAL_COMMUNITY): Payer: Self-pay | Admitting: Emergency Medicine

## 2014-01-10 DIAGNOSIS — G8929 Other chronic pain: Secondary | ICD-10-CM | POA: Diagnosis not present

## 2014-01-10 DIAGNOSIS — Z8701 Personal history of pneumonia (recurrent): Secondary | ICD-10-CM | POA: Diagnosis not present

## 2014-01-10 DIAGNOSIS — Z8719 Personal history of other diseases of the digestive system: Secondary | ICD-10-CM | POA: Diagnosis not present

## 2014-01-10 DIAGNOSIS — Z9861 Coronary angioplasty status: Secondary | ICD-10-CM | POA: Insufficient documentation

## 2014-01-10 DIAGNOSIS — M545 Low back pain, unspecified: Secondary | ICD-10-CM | POA: Insufficient documentation

## 2014-01-10 DIAGNOSIS — J45909 Unspecified asthma, uncomplicated: Secondary | ICD-10-CM | POA: Insufficient documentation

## 2014-01-10 DIAGNOSIS — Z8639 Personal history of other endocrine, nutritional and metabolic disease: Secondary | ICD-10-CM | POA: Insufficient documentation

## 2014-01-10 DIAGNOSIS — F411 Generalized anxiety disorder: Secondary | ICD-10-CM | POA: Diagnosis not present

## 2014-01-10 DIAGNOSIS — Z79899 Other long term (current) drug therapy: Secondary | ICD-10-CM | POA: Insufficient documentation

## 2014-01-10 DIAGNOSIS — Z862 Personal history of diseases of the blood and blood-forming organs and certain disorders involving the immune mechanism: Secondary | ICD-10-CM | POA: Diagnosis not present

## 2014-01-10 DIAGNOSIS — I10 Essential (primary) hypertension: Secondary | ICD-10-CM | POA: Diagnosis not present

## 2014-01-10 DIAGNOSIS — Z8673 Personal history of transient ischemic attack (TIA), and cerebral infarction without residual deficits: Secondary | ICD-10-CM | POA: Diagnosis not present

## 2014-01-10 DIAGNOSIS — I2510 Atherosclerotic heart disease of native coronary artery without angina pectoris: Secondary | ICD-10-CM | POA: Diagnosis not present

## 2014-01-10 LAB — URINALYSIS, ROUTINE W REFLEX MICROSCOPIC
BILIRUBIN URINE: NEGATIVE
Glucose, UA: NEGATIVE mg/dL
Hgb urine dipstick: NEGATIVE
Ketones, ur: NEGATIVE mg/dL
LEUKOCYTES UA: NEGATIVE
NITRITE: NEGATIVE
PROTEIN: NEGATIVE mg/dL
Specific Gravity, Urine: 1.009 (ref 1.005–1.030)
Urobilinogen, UA: 0.2 mg/dL (ref 0.0–1.0)
pH: 7 (ref 5.0–8.0)

## 2014-01-10 MED ORDER — OXYCODONE-ACETAMINOPHEN 5-325 MG PO TABS
2.0000 | ORAL_TABLET | Freq: Once | ORAL | Status: AC
  Administered 2014-01-10: 2 via ORAL
  Filled 2014-01-10: qty 2

## 2014-01-10 MED ORDER — OXYCODONE-ACETAMINOPHEN 5-325 MG PO TABS: 1.0000 | ORAL_TABLET | Freq: Four times a day (QID) | ORAL | Status: DC | PRN

## 2014-01-10 NOTE — ED Provider Notes (Signed)
Medical screening examination/treatment/procedure(s) were conducted as a shared visit with non-physician practitioner(s) and myself.  I personally evaluated the patient during the encounter.   EKG Interpretation None     Pt awake and alert, NAD, respiratory effort normal, alert and oriented x 3- pt taken off fentanyl patch yesterday and now has increased back pain.  Will discharge with po pain meds and patient has followup with her doctor scheduled in the next several days.  Discharged with strict return precautions.  Pt agreeable with plan.  Threasa Beards, MD 01/10/14 2203

## 2014-01-10 NOTE — Discharge Instructions (Signed)
1. Medications: percocet for severe pain, usual home medications 2. Treatment: rest, drink plenty of fluids, take with food 3. Follow Up: Please followup with your primary doctor in 3 days for discussion of your diagnoses and further evaluation after today's visit; if you do not have a primary care doctor use the resource guide provided to find one;

## 2014-01-10 NOTE — ED Notes (Signed)
Pt presents with chronic lower back pain starting this am. Pt has a hx of lower back pain and surgery for the same. Pt received 2 pain injections on Wednesday, pt states she cant remember the name of the medication but states she knows it was for pain. Pt states it has not relieved her pain.

## 2014-01-10 NOTE — ED Provider Notes (Signed)
CSN: [id number]     Arrival date & time 01/10/14  1610 History   First MD Initiated Contact with Patient 01/10/14 1917     Chief Complaint  Patient presents with  . Back Pain     (Consider location/radiation/quality/duration/timing/severity/associated sxs/prior Treatment) The history is provided by the patient and medical records. No language interpreter was used.    [name] [name] is a 78 y.o. female  with a hx of CAD, HTN, esophageal stricture, asthma, anxiety, IDA, chronic nausea and chronic back pain presents to the Emergency Department complaining of gradual, persistent, progressively worsening right sided low back pain onset this morning.  Pt reports Dr. Ernestina Patches gave her shots in her back Wednesday (2 days ago) and the pain has gotten worse.  Pt reports she was here yesterday altered from the use of Fentanyl patches. She states she was taken off the fentanyl patch, but not given anything more for pain.  She reports she is taking Neurontin, but nothing else for pain.  She reports the pain has gotten gradually worse for the last several hours.  Pt reports she is walking without difficulty but with increased pain.  Pt denies numbness and weakness in her legs.  Pt denies fever, chills, headache, neck pain, chest pain, SOB, abd pain, weakness, numbness, loss of bowel or bladder control, difficulty ambulating.        Past Medical History  Diagnosis Date  . CAD (coronary artery disease)     [facility] 2003 with 40% pLAD, 30% mLAD, 100% D1 (moderate vessel), 100%  D2 (small vessel), 95% mid small OM2.  Pt had PTCA to D1;  D2 and OM2 small vessels and tx medically;  Last Myoview (2012): anterior infarct seen with scar, no ischemia. EF normal. Pt managed medically;  Lexiscan Myoview (12/14):  Low risk; ant scar with very mild peri-infarct ischemia; EF 55% with ant AK   . Recurrent aspiration bronchitis/pneumonia     PFTs 12/09 FVC 97%, FEV1 101%, ratio 77%, TLC 109%. No significant obstruction or restriction.    . Hypertension   . Hyperlipidemia   . Esophageal stricture   . Gastritis   . GERD (gastroesophageal reflux disease)   . Asthma   . Blood transfusion   . Cough   . Wheezing   . Weakness   . Trouble swallowing   . Abdominal pain   . Hiatal hernia   . Shortness of breath   . Anxiety   . TIA (transient ischemic attack)   . BRONCHITIS, ACUTE 07/25/2007         . DIVERTICULOSIS, COLON 08/28/2008    Qualifier: Diagnosis of  By: [name], [name], [name]    . IRON DEFICIENCY ANEMIA, HX OF 07/24/2007    Qualifier: Diagnosis of  By: Tilden Dome    . Chronic nausea   . Chronic back pain   . PONV (postoperative nausea and vomiting)    Past Surgical History  Procedure Laterality Date  . Back surgery    . Bladder repair    . Hemorrhoid surgery    . Cholecystectomy      Patient unsure of date.  [name] Appendectomy      patient unsure of date  . Total abdominal hysterectomy  1975    partial  . Coronary angioplasty  11/16/2001  . Eye surgery  11/2000    bilateral cataracts with lens implant  . Hiatal hernia repair  06/03/2011    Procedure: LAPAROSCOPIC REPAIR OF HIATAL HERNIA;  Surgeon: Adin Hector, MD;  Location: [facility];  Service: General;  Laterality: N/A;  . Laparoscopic nissen fundoplication  11/18/4578    Procedure: LAPAROSCOPIC NISSEN FUNDOPLICATION;  Surgeon: Adin Hector, MD;  Location: [facility];  Service: General;  Laterality: N/A;  . Lumbar laminectomy/decompression microdiscectomy Right 06/26/2012    Procedure: LUMBAR LAMINECTOMY/DECOMPRESSION MICRODISCECTOMY;  Surgeon: Jessy Oto, MD;  Location: [facility];  Service: Orthopedics;  Laterality: Right;  RIGHT L4-5 MICRODISCECTOMY  . Esophagogastroduodenoscopy N/A 03/15/2013    Procedure: ESOPHAGOGASTRODUODENOSCOPY (EGD);  Surgeon: Jerene Bears, MD;  Location: [facility];  Service: Endoscopy;  Laterality: N/A;  . Esophageal manometry N/A 03/25/2013    Procedure: ESOPHAGEAL MANOMETRY (EM);  Surgeon: Inda Castle, MD;  Location:  [facility];  Service: Endoscopy;  Laterality: N/A;  . Hiatal hernia repair N/A 04/24/2013    Procedure: LAPAROSCOPIC  REPAIR RECURRENT PARASOPHAGEAL HIATAL HERNIA WITH FUNDOPLICATION;  Surgeon: Adin Hector, MD;  Location: [facility];  Service: General;  Laterality: N/A;  . Insertion of mesh N/A 04/24/2013    Procedure: INSERTION OF MESH;  Surgeon: Adin Hector, MD;  Location: [facility];  Service: General;  Laterality: N/A;  . Laparoscopic lysis of adhesions N/A 04/24/2013    Procedure: LAPAROSCOPIC LYSIS OF ADHESIONS;  Surgeon: Adin Hector, MD;  Location: [facility];  Service: General;  Laterality: N/A;  . Fixation kyphoplasty lumbar spine  X 2    "L3-4"   Family History  Problem Relation Age of Onset  . Cancer Mother     colon   . Colon cancer Mother 53  . Heart disease Father 78    heart attack   History  Substance Use Topics  . Smoking status: Never Smoker   . Smokeless tobacco: Never Used  . Alcohol Use: No   OB History   Grav Para Term Preterm Abortions TAB SAB Ect Mult Living                 Review of Systems  Constitutional: Negative for fever and fatigue.  Respiratory: Negative for chest tightness and shortness of breath.   Cardiovascular: Negative for chest pain.  Gastrointestinal: Negative for nausea, vomiting, abdominal pain and diarrhea.  Genitourinary: Negative for dysuria, urgency, frequency and hematuria.  Musculoskeletal: Positive for back pain. Negative for gait problem, joint swelling, neck pain and neck stiffness.  Skin: Negative for rash.  Neurological: Negative for weakness, light-headedness, numbness and headaches.  All other systems reviewed and are negative.     Allergies  Aspartame; Daliresp; Tramadol; Ace inhibitors; Hydrocodone; and Levofloxacin  Home Medications   Prior to Admission medications   Medication Sig Start Date End Date Taking? Authorizing Provider  acetaminophen (TYLENOL) 500 MG tablet Take 500 mg by mouth every 6 (six) hours  as needed for moderate pain.   Yes Historical Provider, MD  Aclidinium Bromide (TUDORZA PRESSAIR) 400 MCG/ACT AEPB Inhale 1 puff into the lungs 2 (two) times daily as needed (wheezing).    Yes Historical Provider, MD  ALPRAZolam Duanne Moron) 0.5 MG tablet Take 1 tablet (0.5 mg total) by mouth 3 (three) times daily as needed for sleep or anxiety. 09/20/13  Yes [name], MD  amLODipine (NORVASC) 10 MG tablet Take 10 mg by mouth daily. For blood pressure   Yes Historical Provider, MD  Cholecalciferol (VITAMIN D3) 2000 UNITS TABS Take 6,000 Units by mouth daily.    Yes Historical Provider, MD  feeding supplement, GLUCERNA SHAKE, (GLUCERNA SHAKE) LIQD Take 237 mLs by mouth 3 (three) times daily between meals. 12/05/13  Yes Shanker Kristeen Mans, MD  ibuprofen (ADVIL,MOTRIN) 200 MG tablet Take 200 mg by mouth every 6 (six) hours as needed for moderate pain.   Yes Historical Provider, MD  losartan (COZAAR) 100 MG tablet Take 100 mg by mouth daily.   Yes Historical Provider, MD  metoCLOPramide (REGLAN) 10 MG tablet Take 5 mg by mouth every 8 (eight) hours as needed for nausea or vomiting.  12/05/13  Yes Shanker Kristeen Mans, MD  metoprolol tartrate (LOPRESSOR) 25 MG tablet Take 12.5 mg by mouth daily.    Yes Historical Provider, MD  mometasone-formoterol (DULERA) 200-5 MCG/ACT AERO Inhale 2 puffs into the lungs 2 (two) times daily. 07/03/13  Yes Melissa R Smith, PA-C  ondansetron (ZOFRAN ODT) 8 MG disintegrating tablet Take 1 tablet (8 mg total) by mouth every 8 (eight) hours as needed for nausea or vomiting. For nausea 09/20/13 09/21/14 Yes [name], MD  OVER THE COUNTER MEDICATION Take 1,000 mg by mouth daily. Coral Calcium 1057m   Yes Historical Provider, MD  OVER THE COUNTER MEDICATION Take 400 mg by mouth 2 (two) times daily. Magnesium Carbonate 4039m  Yes Historical Provider, MD  polyethylene glycol (MIRALAX / GLYCOLAX) packet Take 17 g by mouth 2 (two) times daily.   Yes Historical Provider, MD  senna  (SENOKOT) 8.6 MG TABS tablet Take 2 tablets by mouth at bedtime. 12/05/13  Yes Shanker M Kristeen MansMD  oxyCODONE-acetaminophen (PERCOCET) 5-325 MG per tablet Take 1-2 tablets by mouth every 6 (six) hours as needed. 01/10/14   Waymon Laser, PA-C   BP 157/67  Pulse 81  Temp(Src) 97.7 F (36.5 C) (Oral)  Resp 16  Ht _0  (1.651 m)  Wt 126 lb (57.153 kg)  BMI 20.97 kg/m2  SpO2 97% Physical Exam  Nursing note and vitals reviewed. Constitutional: She appears well-developed and well-nourished. No distress.  HENT:  Head: Normocephalic and atraumatic.  Mouth/Throat: Oropharynx is clear and moist. No oropharyngeal exudate.  Eyes: Conjunctivae are normal.  Neck: Normal range of motion. Neck supple.  Full ROM without pain  Cardiovascular: Normal rate, regular rhythm, normal heart sounds and intact distal pulses.   No murmur heard. Pulmonary/Chest: Effort normal and breath sounds normal. No respiratory distress. She has no wheezes.  Abdominal: Soft. She exhibits no distension. There is no tenderness. There is no guarding and no CVA tenderness.  No CVA Tenderness No abd mass  Musculoskeletal:  Full range of motion of the T-spine and L-spine No tenderness to palpation of the spinous processes of the T-spine or L-spine Mild tenderness to palpation of the right paraspinous muscles of the L-spine  Lymphadenopathy:    She has no cervical adenopathy.  Neurological: She is alert. She has normal reflexes. GCS eye subscore is 4. GCS verbal subscore is 5. GCS motor subscore is 6.  Reflex Scores:      Bicep reflexes are 2+ on the right side and 2+ on the left side.      Brachioradialis reflexes are 2+ on the right side and 2+ on the left side.      Patellar reflexes are 2+ on the right side and 2+ on the left side.      Achilles reflexes are 2+ on the right side and 2+ on the left side. Speech is clear and goal oriented, follows commands Normal 5/5 strength in upper and lower extremities  bilaterally including dorsiflexion and plantar flexion, strong and equal grip strength Sensation normal to light and sharp touch Moves extremities without ataxia,  coordination intact Normal gait Normal balance   Skin: Skin is warm and dry. No rash noted. She is not diaphoretic. No erythema.  Psychiatric: She has a normal mood and affect. Her behavior is normal.    ED Course  Procedures (including critical care time) Labs Review Labs Reviewed  URINALYSIS, ROUTINE W REFLEX MICROSCOPIC    Imaging Review Ct Head Wo Contrast  01/09/2014   CLINICAL DATA:  Altered mental status  EXAM: CT HEAD WITHOUT CONTRAST  TECHNIQUE: Contiguous axial images were obtained from the base of the skull through the vertex without intravenous contrast.  COMPARISON:  None.  FINDINGS: Moderate to advance periventricular and subcortical white matter hypodensities, a nonspecific finding most often seen in the setting of chronic microangiopathic change. No definite CT evidence of an acute infarction. No intraparenchymal hemorrhage, mass, mass effect, or abnormal extra-axial fluid collection. No overt hydrocephalus. The visualized paranasal sinuses and mastoid air cells are predominantly clear. Mild calvarial heterogeneity is nonspecific. Patient has a reported history of multiple myeloma.  IMPRESSION: White matter changes as above. No definite CT evidence of an acute intracranial abnormality.   Electronically Signed   By: Carlos Levering M.D.   On: 01/09/2014 03:20     EKG Interpretation None      MDM   Final diagnoses:  Right-sided low back pain without sciatica   Sami Roes presents with right sided low back pain, acute on chronic.  Pt's fentanyl patch was d/c yesterday due to confusion, but no pain medication was given in its place.  Pt with normal neurologic exam.    9:28 PM Pt given percocet with adequate pain control.  Pt continues to be able to walk.  Patient with acute on chronic back pain.  No  neurological deficits and normal neuro exam.  Patient can walk without difficulty.  No loss of bowel or bladder control.  No concern for cauda equina.  No fever, night sweats, weight loss, h/o cancer, IVDU.  RICE protocol and pain medicine indicated and discussed with patient.    I have personally reviewed patient's vitals, nursing note and any pertinent labs or imaging.  I performed an undressed physical exam.    At this time, it has been determined that no acute conditions requiring further emergency intervention. The patient/guardian have been advised of the diagnosis and plan. I reviewed all labs and imaging including any potential incidental findings. We have discussed signs and symptoms that warrant return to the ED, such as loss of bowel or bladder control, gait distrubance.  Patient/guardian has voiced understanding and agreed to follow-up with the PCP or specialist in  3 days.  Pt reports she already has an appointment set up with her PCP at the beginning of next week.  Vital signs are stable at discharge.   BP 157/67  Pulse 81  Temp(Src) 97.7 F (36.5 C) (Oral)  Resp 16  Ht _0  (1.651 m)  Wt 126 lb (57.153 kg)  BMI 20.97 kg/m2  SpO2 97%  The patient was discussed with and seen by Dr. Canary Brim who agrees with the treatment plan.   Jarrett Soho Mariadel Mruk, PA-C 01/10/14 2140

## 2014-01-14 ENCOUNTER — Ambulatory Visit: Payer: Self-pay | Admitting: Internal Medicine

## 2014-01-15 ENCOUNTER — Ambulatory Visit (INDEPENDENT_AMBULATORY_CARE_PROVIDER_SITE_OTHER): Payer: Medicare HMO | Admitting: Internal Medicine

## 2014-01-15 ENCOUNTER — Encounter: Payer: Self-pay | Admitting: Internal Medicine

## 2014-01-15 VITALS — BP 126/64 | HR 68 | Temp 97.6°F | Resp 16 | Ht 63.75 in | Wt 133.6 lb

## 2014-01-15 DIAGNOSIS — Z23 Encounter for immunization: Secondary | ICD-10-CM

## 2014-01-15 DIAGNOSIS — I10 Essential (primary) hypertension: Secondary | ICD-10-CM

## 2014-01-15 DIAGNOSIS — R110 Nausea: Secondary | ICD-10-CM

## 2014-01-15 DIAGNOSIS — Z79899 Other long term (current) drug therapy: Secondary | ICD-10-CM

## 2014-01-15 LAB — CBC WITH DIFFERENTIAL/PLATELET
BASOS ABS: 0.1 10*3/uL (ref 0.0–0.1)
BASOS PCT: 1 % (ref 0–1)
EOS ABS: 0.8 10*3/uL — AB (ref 0.0–0.7)
Eosinophils Relative: 11 % — ABNORMAL HIGH (ref 0–5)
HCT: 39.4 % (ref 36.0–46.0)
Hemoglobin: 13.2 g/dL (ref 12.0–15.0)
Lymphocytes Relative: 33 % (ref 12–46)
Lymphs Abs: 2.5 10*3/uL (ref 0.7–4.0)
MCH: 30 pg (ref 26.0–34.0)
MCHC: 33.5 g/dL (ref 30.0–36.0)
MCV: 89.5 fL (ref 78.0–100.0)
MONO ABS: 0.8 10*3/uL (ref 0.1–1.0)
Monocytes Relative: 10 % (ref 3–12)
NEUTROS ABS: 3.5 10*3/uL (ref 1.7–7.7)
Neutrophils Relative %: 45 % (ref 43–77)
Platelets: 363 10*3/uL (ref 150–400)
RBC: 4.4 MIL/uL (ref 3.87–5.11)
RDW: 14.9 % (ref 11.5–15.5)
WBC: 7.7 10*3/uL (ref 4.0–10.5)

## 2014-01-15 MED ORDER — METOCLOPRAMIDE HCL 5 MG PO TABS: ORAL_TABLET | ORAL | Status: DC

## 2014-01-15 NOTE — Progress Notes (Signed)
Subjective:    Patient ID: [name] [name], female    DOB: Oct 31, 1935, 78 y.o.   MRN: [id number]  HPI patient continues to c/o Nausea with 2 trips to the ER in the last week. She repaorts eating doe seem to help her nausea. She relates the onset of her persistent Nausea to the repair of a large paraesophageal hernia in Dec 2014. She reports an ache in the epigastric area. Husband reminds that she is taking Percocet tid/qid from Drs Louanne Skye & Ernestina Patches and husband is concerned about her lethargy and slurred speech also.  She denies any water brash type reflux. Re does relate that she had least to no nausea when taking Reglan.    Medication List   acetaminophen 500 MG tablet  Commonly known as:  TYLENOL  Take 500 mg by mouth every 6 (six) hours as needed for moderate pain.     ALPRAZolam 0.5 MG tablet  Commonly known as:  XANAX  Take 1 tablet (0.5 mg total) by mouth 3 (three) times daily as needed for sleep or anxiety.     amLODipine 10 MG tablet  Commonly known as:  NORVASC  Take 10 mg by mouth daily. For blood pressure     feeding supplement (GLUCERNA SHAKE) Liqd  Take 237 mLs by mouth 3 (three) times daily between meals.     ibuprofen 200 MG tablet  Commonly known as:  ADVIL,MOTRIN  Take 200 mg by mouth every 6 (six) hours as needed for moderate pain.     losartan 100 MG tablet  Commonly known as:  COZAAR  Take 100 mg by mouth daily.     metoprolol tartrate 25 MG tablet  Commonly known as:  LOPRESSOR  Take 12.5 mg by mouth daily.     mometasone-formoterol 200-5 MCG/ACT Aero  Commonly known as:  DULERA  Inhale 2 puffs into the lungs 2 (two) times daily.     ondansetron 8 MG disintegrating tablet  Commonly known as:  ZOFRAN ODT  Take 1 tablet (8 mg total) by mouth every 8 (eight) hours as needed for nausea or vomiting. For nausea     OVER THE COUNTER MEDICATION  Take 1,000 mg by mouth daily. Coral Calcium 1000mg      OVER THE COUNTER MEDICATION  Take 400 mg by mouth 2 (two)  times daily. Magnesium Carbonate 400mg      oxyCODONE-acetaminophen 5-325 MG per tablet  Commonly known as:  PERCOCET  Take 1-2 tablets by mouth every 6 (six) hours as needed.     polyethylene glycol packet  Commonly known as:  MIRALAX / GLYCOLAX  Take 17 g by mouth 2 (two) times daily.     senna 8.6 MG Tabs tablet  Commonly known as:  SENOKOT  Take 2 tablets by mouth at bedtime.     TUDORZA PRESSAIR 400 MCG/ACT Aepb  Generic drug:  Aclidinium Bromide  Inhale 1 puff into the lungs 2 (two) times daily as needed (wheezing).     Vitamin D3 2000 UNITS Tabs  Take 6,000 Units by mouth daily.     Allergies  Allergen Reactions  . Aspartame Diarrhea and Nausea And Vomiting  . Daliresp [Roflumilast] Nausea And Vomiting  . Tramadol Other (See Comments)    Blurred vision   . Ace Inhibitors Cough    Per Dr Melvyn Novas pulmonology 2013  . Hydrocodone Nausea Only  . Levofloxacin Nausea And Vomiting   Past Medical History  Diagnosis Date  . CAD (coronary artery disease)  [place] 2003 with 40% pLAD, 30% mLAD, 100% D1 (moderate vessel), 100%  D2 (small vessel), 95% mid small OM2.  Pt had PTCA to D1;  D2 and OM2 small vessels and tx medically;  Last Myoview (2012): anterior infarct seen with scar, no ischemia. EF normal. Pt managed medically;  Lexiscan Myoview (12/14):  Low risk; ant scar with very mild peri-infarct ischemia; EF 55% with ant AK   . Recurrent aspiration bronchitis/pneumonia     PFTs 12/09 FVC 97%, FEV1 101%, ratio 77%, TLC 109%. No significant obstruction or restriction.   . Hypertension   . Hyperlipidemia   . Esophageal stricture   . Gastritis   . GERD (gastroesophageal reflux disease)   . Asthma   . Blood transfusion   . Cough   . Wheezing   . Weakness   . Trouble swallowing   . Abdominal pain   . Hiatal hernia   . Shortness of breath   . Anxiety   . TIA (transient ischemic attack)   . BRONCHITIS, ACUTE 07/25/2007         . DIVERTICULOSIS, COLON 08/28/2008    Qualifier:  Diagnosis of  By: [name], [name], [name]    . IRON DEFICIENCY ANEMIA, HX OF 07/24/2007    Qualifier: Diagnosis of  By: Tilden Dome    . Chronic nausea   . Chronic back pain   . PONV (postoperative nausea and vomiting)    Past Surgical History  Procedure Laterality Date  . Back surgery    . Bladder repair    . Hemorrhoid surgery    . Cholecystectomy      Patient unsure of date.  [name] Appendectomy      patient unsure of date  . Total abdominal hysterectomy  1975    partial  . Coronary angioplasty  11/16/2001  . Eye surgery  11/2000    bilateral cataracts with lens implant  . Hiatal hernia repair  06/03/2011    Procedure: LAPAROSCOPIC REPAIR OF HIATAL HERNIA;  Surgeon: Adin Hector, MD;  Location: [facility];  Service: General;  Laterality: N/A;  . Laparoscopic nissen fundoplication  6/78/9381    Procedure: LAPAROSCOPIC NISSEN FUNDOPLICATION;  Surgeon: Adin Hector, MD;  Location: [facility];  Service: General;  Laterality: N/A;  . Lumbar laminectomy/decompression microdiscectomy Right 06/26/2012    Procedure: LUMBAR LAMINECTOMY/DECOMPRESSION MICRODISCECTOMY;  Surgeon: Jessy Oto, MD;  Location: [place];  Service: Orthopedics;  Laterality: Right;  RIGHT L4-5 MICRODISCECTOMY  . Esophagogastroduodenoscopy N/A 03/15/2013    Procedure: ESOPHAGOGASTRODUODENOSCOPY (EGD);  Surgeon: Jerene Bears, MD;  Location: [facility];  Service: Endoscopy;  Laterality: N/A;  . Esophageal manometry N/A 03/25/2013    Procedure: ESOPHAGEAL MANOMETRY (EM);  Surgeon: Inda Castle, MD;  Location: [facility];  Service: Endoscopy;  Laterality: N/A;  . Hiatal hernia repair N/A 04/24/2013    Procedure: LAPAROSCOPIC  REPAIR RECURRENT PARASOPHAGEAL HIATAL HERNIA WITH FUNDOPLICATION;  Surgeon: Adin Hector, MD;  Location: [facility];  Service: General;  Laterality: N/A;  . Insertion of mesh N/A 04/24/2013    Procedure: INSERTION OF MESH;  Surgeon: Adin Hector, MD;  Location: [facility];  Service: General;  Laterality:  N/A;  . Laparoscopic lysis of adhesions N/A 04/24/2013    Procedure: LAPAROSCOPIC LYSIS OF ADHESIONS;  Surgeon: Adin Hector, MD;  Location: [facility];  Service: General;  Laterality: N/A;  . Fixation kyphoplasty lumbar spine  X 2    "L3-4"   Review of Systems Neg 10 systems review except as  above.  Objective:   Physical Exam BP 126/64  Pulse 68  Temp(Src) 97.6 F (36.4 C) (Temporal)  Resp 16  Ht 5' 3.75" (1.619 m)  Wt 133 lb 9.6 oz (60.601 kg)  BMI 23.12 kg/m2  HEENT - Eac's patent. TM's Nl. EOM's full. PERRLA. NasoOroPharynx clear. Neck - supple. Nl Thyroid. Carotids 2+ & No bruits, nodes, JVD Chest - Clear equal BS w/o Rales, rhonchi, wheezes. Cor - Nl HS. RRR w/o sig MGR. PP 1(+). No edema. Abd - No palpable organomegaly, masses or tenderness. BS nl. MS- FROM w/o deformities. Muscle power, tone and bulk Nl. Gait Nl. Neuro - No obvious Cr N abnormalities. Sensory, motor and Cerebellar functions appear Nl w/o focal abnormalities. Patient's mentation does seem somewhat dulled and speech is slightly slurred  Assessment & Plan:   1. Hypertension - continue meds same  2. Nausea  - will retry on low dose reglan 2.5 mg bid/tid  3. Encounter for long-term (current) use of  medications - CBC with Differential - BASIC METABOLIC PANEL WITH GFR  4. Need for prophylactic vaccination and inoculation against influenza  - Flu vaccine HIGH DOSE PF (Fluzone Tri High dose)  - encouraged patient and spouse to return to Dr's Nitka & Ernestina Patches to reassess her pain meds which may be contributing to her nausea and lethargy.

## 2014-01-16 LAB — BASIC METABOLIC PANEL WITH GFR
BUN: 17 mg/dL (ref 6–23)
CO2: 29 mEq/L (ref 19–32)
CREATININE: 0.74 mg/dL (ref 0.50–1.10)
Calcium: 9.4 mg/dL (ref 8.4–10.5)
Chloride: 103 mEq/L (ref 96–112)
GFR, Est Non African American: 79 mL/min
GLUCOSE: 82 mg/dL (ref 70–99)
POTASSIUM: 4.4 meq/L (ref 3.5–5.3)
Sodium: 138 mEq/L (ref 135–145)

## 2014-01-16 NOTE — ED Provider Notes (Signed)
CSN: [id number]     Arrival date & time 01/09/14  0103 History   First MD Initiated Contact with Patient 01/09/14 0216     Chief Complaint  Patient presents with  . Altered Mental Status     (Consider location/radiation/quality/duration/timing/severity/associated sxs/prior Treatment) HPI  78 year old female accompanied by her husband. Sling for evaluation of change in her mental status. Patient has seemed confused generally not her normal self since around 12 noon on Wednesday. Seems generally fatigued. Slow and shows some questions. No recent trauma. No fevers or chills. Recent medication change with addition of a fentanyl patch for treatment of chronic back pain. This was started a few days prior to symptom onset. Her husband took the patch off did not notice any difference after a few hours she brought her for evaluation. Currently, patient is drowsy but arousable to conversational voice. Denies any significant change from her chronic pain. No acute pain complaints. No urinary complaints. Not short of breath. She agrees that she does not feel like her normal self, but she's not sure exactly why.  Past Medical History  Diagnosis Date  . CAD (coronary artery disease)     LHC 2003 with 40% pLAD, 30% mLAD, 100% D1 (moderate vessel), 100%  D2 (small vessel), 95% mid small OM2.  Pt had PTCA to D1;  D2 and OM2 small vessels and tx medically;  Last Myoview (2012): anterior infarct seen with scar, no ischemia. EF normal. Pt managed medically;  Lexiscan Myoview (12/14):  Low risk; ant scar with very mild peri-infarct ischemia; EF 55% with ant AK   . Recurrent aspiration bronchitis/pneumonia     PFTs 12/09 FVC 97%, FEV1 101%, ratio 77%, TLC 109%. No significant obstruction or restriction.   . Hypertension   . Hyperlipidemia   . Esophageal stricture   . Gastritis   . GERD (gastroesophageal reflux disease)   . Asthma   . Blood transfusion   . Cough   . Wheezing   . Weakness   . Trouble swallowing    . Abdominal pain   . Hiatal hernia   . Shortness of breath   . Anxiety   . TIA (transient ischemic attack)   . BRONCHITIS, ACUTE 07/25/2007         . DIVERTICULOSIS, COLON 08/28/2008    Qualifier: Diagnosis of  By: [name], [name], [name]    . IRON DEFICIENCY ANEMIA, HX OF 07/24/2007    Qualifier: Diagnosis of  By: Tilden Dome    . Chronic nausea   . Chronic back pain   . PONV (postoperative nausea and vomiting)    Past Surgical History  Procedure Laterality Date  . Back surgery    . Bladder repair    . Hemorrhoid surgery    . Cholecystectomy      Patient unsure of date.  [name] Appendectomy      patient unsure of date  . Total abdominal hysterectomy  1975    partial  . Coronary angioplasty  11/16/2001  . Eye surgery  11/2000    bilateral cataracts with lens implant  . Hiatal hernia repair  06/03/2011    Procedure: LAPAROSCOPIC REPAIR OF HIATAL HERNIA;  Surgeon: Adin Hector, MD;  Location: [facility];  Service: General;  Laterality: N/A;  . Laparoscopic nissen fundoplication  09/11/3974    Procedure: LAPAROSCOPIC NISSEN FUNDOPLICATION;  Surgeon: Adin Hector, MD;  Location: [facility];  Service: General;  Laterality: N/A;  . Lumbar laminectomy/decompression microdiscectomy Right 06/26/2012    Procedure: LUMBAR  LAMINECTOMY/DECOMPRESSION MICRODISCECTOMY;  Surgeon: Jessy Oto, MD;  Location: [facility];  Service: Orthopedics;  Laterality: Right;  RIGHT L4-5 MICRODISCECTOMY  . Esophagogastroduodenoscopy N/A 03/15/2013    Procedure: ESOPHAGOGASTRODUODENOSCOPY (EGD);  Surgeon: Jerene Bears, MD;  Location: [facility];  Service: Endoscopy;  Laterality: N/A;  . Esophageal manometry N/A 03/25/2013    Procedure: ESOPHAGEAL MANOMETRY (EM);  Surgeon: Inda Castle, MD;  Location: [facility];  Service: Endoscopy;  Laterality: N/A;  . Hiatal hernia repair N/A 04/24/2013    Procedure: LAPAROSCOPIC  REPAIR RECURRENT PARASOPHAGEAL HIATAL HERNIA WITH FUNDOPLICATION;  Surgeon: Adin Hector, MD;   Location: [facility];  Service: General;  Laterality: N/A;  . Insertion of mesh N/A 04/24/2013    Procedure: INSERTION OF MESH;  Surgeon: Adin Hector, MD;  Location: [facility];  Service: General;  Laterality: N/A;  . Laparoscopic lysis of adhesions N/A 04/24/2013    Procedure: LAPAROSCOPIC LYSIS OF ADHESIONS;  Surgeon: Adin Hector, MD;  Location: [facility];  Service: General;  Laterality: N/A;  . Fixation kyphoplasty lumbar spine  X 2    "L3-4"   Family History  Problem Relation Age of Onset  . Cancer Mother     colon   . Colon cancer Mother 69  . Heart disease Father 60    heart attack   History  Substance Use Topics  . Smoking status: Never Smoker   . Smokeless tobacco: Never Used  . Alcohol Use: No   OB History   Grav Para Term Preterm Abortions TAB SAB Ect Mult Living                 Review of Systems  All systems reviewed and negative, other than as noted in HPI.   Allergies  Aspartame; Daliresp; Tramadol; Ace inhibitors; Hydrocodone; and Levofloxacin  Home Medications   Prior to Admission medications   Medication Sig Start Date End Date Taking? Authorizing Provider  Aclidinium Bromide (TUDORZA PRESSAIR) 400 MCG/ACT AEPB Inhale 1 puff into the lungs 2 (two) times daily as needed (wheezing).    Yes Historical Provider, MD  ALPRAZolam Duanne Moron) 0.5 MG tablet Take 1 tablet (0.5 mg total) by mouth 3 (three) times daily as needed for sleep or anxiety. 09/20/13  Yes [name], MD  amLODipine (NORVASC) 10 MG tablet Take 10 mg by mouth daily. For blood pressure   Yes Historical Provider, MD  Cholecalciferol (VITAMIN D3) 2000 UNITS TABS Take 6,000 Units by mouth daily.    Yes Historical Provider, MD  feeding supplement, GLUCERNA SHAKE, (GLUCERNA SHAKE) LIQD Take 237 mLs by mouth 3 (three) times daily between meals. 12/05/13  Yes Shanker Kristeen Mans, MD  losartan (COZAAR) 100 MG tablet Take 100 mg by mouth daily.   Yes Historical Provider, MD  metoprolol tartrate (LOPRESSOR) 25  MG tablet Take 12.5 mg by mouth daily.    Yes Historical Provider, MD  mometasone-formoterol (DULERA) 200-5 MCG/ACT AERO Inhale 2 puffs into the lungs 2 (two) times daily. 07/03/13  Yes Melissa R Smith, PA-C  ondansetron (ZOFRAN ODT) 8 MG disintegrating tablet Take 1 tablet (8 mg total) by mouth every 8 (eight) hours as needed for nausea or vomiting. For nausea 09/20/13 09/21/14 Yes [name], MD  OVER THE COUNTER MEDICATION Take 1,000 mg by mouth daily. Coral Calcium 1000mg    Yes Historical Provider, MD  OVER THE COUNTER MEDICATION Take 400 mg by mouth 2 (two) times daily. Magnesium Carbonate 400mg    Yes Historical Provider, MD  polyethylene glycol (MIRALAX /  GLYCOLAX) packet Take 17 g by mouth 2 (two) times daily.   Yes Historical Provider, MD  senna (SENOKOT) 8.6 MG TABS tablet Take 2 tablets by mouth at bedtime. 12/05/13  Yes Shanker Kristeen Mans, MD  acetaminophen (TYLENOL) 500 MG tablet Take 500 mg by mouth every 6 (six) hours as needed for moderate pain.    Historical Provider, MD  ibuprofen (ADVIL,MOTRIN) 200 MG tablet Take 200 mg by mouth every 6 (six) hours as needed for moderate pain.    Historical Provider, MD  metoCLOPramide (REGLAN) 5 MG tablet Take 1/2 to 1 tablet 3 x day before meals for Nausea 01/15/14 03/17/14  [name], MD  oxyCODONE-acetaminophen (PERCOCET) 5-325 MG per tablet Take 1-2 tablets by mouth every 6 (six) hours as needed. 01/10/14   Hannah Muthersbaugh, PA-C   BP 105/42  Pulse 79  Temp(Src) 97.7 F (36.5 C) (Oral)  Resp 20  SpO2 96% Physical Exam  Nursing note and vitals reviewed. Constitutional: She appears well-developed and well-nourished. No distress.  HENT:  Head: Normocephalic and atraumatic.  Eyes: Conjunctivae are normal. Right eye exhibits no discharge. Left eye exhibits no discharge.  Neck: Neck supple.  Cardiovascular: Normal rate, regular rhythm and normal heart sounds.  Exam reveals no gallop and no friction rub.   No murmur  heard. Pulmonary/Chest: Effort normal and breath sounds normal. No respiratory distress.  Abdominal: Soft. She exhibits no distension. There is no tenderness.  Musculoskeletal: She exhibits no edema and no tenderness.  Neurological:  Mildly drowsy, but awakens to voice. Speech is clear. Content is appropriate. She is alert her person, place and time. She follows commands. Cranial nerves are intact. Strength is 5 out of 5 bilateral upper and lower extremities. Good finger to nose testing bilaterally.  Skin: Skin is warm and dry.  Psychiatric: She has a normal mood and affect. Her behavior is normal. Thought content normal.    ED Course  Procedures (including critical care time) Labs Review Labs Reviewed  CBC WITH DIFFERENTIAL - Abnormal; Notable for the following:    Neutrophils Relative % 88 (*)    Lymphocytes Relative 10 (*)    Monocytes Relative 2 (*)    All other components within normal limits  BASIC METABOLIC PANEL - Abnormal; Notable for the following:    Glucose, Bld 157 (*)    GFR calc non Af Amer 85 (*)    All other components within normal limits  URINALYSIS, ROUTINE W REFLEX MICROSCOPIC - Abnormal; Notable for the following:    APPearance CLOUDY (*)    All other components within normal limits    Imaging Review No results found.   EKG Interpretation   Date/Time:  Thursday January 09 2014 01:14:23 EDT Ventricular Rate:  85 PR Interval:  140 QRS Duration: 80 QT Interval:  398 QTC Calculation: 473 R Axis:   18 Text Interpretation:  Normal sinus rhythm Lateral infarct , age  undetermined Abnormal ECG ED PHYSICIAN INTERPRETATION AVAILABLE IN [place]  [place] Confirmed by TEST, Record ([id number]) on 01/11/2014 11:12:15 AM      MDM   Final diagnoses:  Altered mental status, unspecified altered mental status type  Fentanyl adverse reaction, initial encounter    And 46 rolled female with altered mental status. Suspect that this is related to fentanyl patch.  Symptoms began shortly after beginning to use it. Her husband took it off earlier today. Initially he does not see a response her brought to the emergency room. Since being here though she has slowly  and progressively improved to the point where she is back to her baseline. Workup has otherwise been unremarkable. At this point I feel she is safe for discharge. Her and her husband are both comfortable with this. Recommended not to further use this and needs to discuss fruther with prescribing provider.    Virgel Manifold, MD 01/16/14 [phone]

## 2014-01-20 ENCOUNTER — Other Ambulatory Visit: Payer: Self-pay | Admitting: Internal Medicine

## 2014-02-10 ENCOUNTER — Ambulatory Visit (INDEPENDENT_AMBULATORY_CARE_PROVIDER_SITE_OTHER): Payer: Medicare HMO | Admitting: Internal Medicine

## 2014-02-10 ENCOUNTER — Encounter: Payer: Self-pay | Admitting: Internal Medicine

## 2014-02-10 VITALS — BP 120/62 | HR 84 | Temp 97.7°F | Resp 12 | Ht 63.25 in | Wt 140.0 lb

## 2014-02-10 DIAGNOSIS — M810 Age-related osteoporosis without current pathological fracture: Secondary | ICD-10-CM | POA: Insufficient documentation

## 2014-02-10 LAB — VITAMIN D 25 HYDROXY (VIT D DEFICIENCY, FRACTURES): VITD: 61.05 ng/mL (ref 30.00–100.00)

## 2014-02-10 LAB — PHOSPHORUS: Phosphorus: 3.3 mg/dL (ref 2.3–4.6)

## 2014-02-10 NOTE — Patient Instructions (Signed)
Please stop at the lab.  Please collect a urine sample: Patient information (Up-to-Date): Collection of a 24-hour urine specimen  - You should collect every drop of urine during each 24-hour period. It does not matter how much or little urine is passed each time, as long as every drop is collected. - Begin the urine collection in the morning after you wake up, after you have emptied your bladder for the first time. - Urinate (empty the bladder) for the first time and flush it down the toilet. Note the exact time (eg, 6:15 AM). You will begin the urine collection at this time. - Collect ev.xtrtcery drop of urine during the day and night in an empty collection bottle. Store the bottle at room temperature or in the refrigerator. - If you need to have a bowel movement, any urine passed with the bowel movement should be collected. Try not to include feces with the urine collection. If feces does get mixed in, do not try to remove the feces from the urine collection bottle. - Finish by collecting the first urine passed the next morning, adding it to the collection bottle. This should be within ten minutes before or after the time of the first morning void on the first day (which was flushed). In this example, you would try to void between 6:05 and 6:25 on the second day. - If you need to urinate one hour before the final collection time, drink a full glass of water so that you can void again at the appropriate time. If you have to urinate 20 minutes before, try to hold the urine until the proper time. - Please note the exact time of the final collection, even if it is not the same time as when collection began on day 1. - The bottle(s) may be kept at room temperature for a day or two, but should be kept cool or refrigerated for longer periods of time.  Please come back in 4 months (~3 months after starting the Teriparatide).  Teriparatide injection What is this medicine? TERIPARATIDE (terr ih PAR a tyd)  increases bone mass and strength. It helps make healthy bone and to slow bone loss. This medicine is used to prevent bone fractures. This medicine may be used for other purposes; ask your health care provider or pharmacist if you have questions. COMMON BRAND NAME(S): Forteo What should I tell my health care provider before I take this medicine? They need to know if you have any of these conditions: -bone disease other than osteoporosis -heart, kidney or liver disease -history of cancer in the bone -kidney stone -Paget's disease -parathyroid disease -receiving radiation therapy -an unusual or allergic reaction to teriparatide, other medicines, foods, dyes, or preservatives -pregnant or trying to get pregnant -breast-feeding How should I use this medicine? This medicine comes in an injection pen device. This pen injects the medicine under your skin. Follow the directions on the prescription label. You will be taught how to use this medicine. Take your medicine at regular intervals. Do not take your medicine more often than directed. Do not use this medicine if it has solid particles in it, or if it is cloudy or colored. It should be clear and colorless. Be sure that you are using your pen device correctly. A special MedGuide will be given to you by the pharmacist with each prescription and refill. Be sure to read this information carefully each time. Talk to your pediatrician regarding the use of this medicine in children. Special care  may be needed. Overdosage: If you think you have taken too much of this medicine contact a poison control center or emergency room at once. NOTE: This medicine is only for you. Do not share this medicine with others. What if I miss a dose? If you miss a dose, take it as soon as you can. If it is almost time for your next dose, take only that dose. Do not take double or extra doses. What may interact with this medicine? -digoxin -other medicines to strengthen  bone This list may not describe all possible interactions. Give your health care provider a list of all the medicines, herbs, non-prescription drugs, or dietary supplements you use. Also tell them if you smoke, drink alcohol, or use illegal drugs. Some items may interact with your medicine. What should I watch for while using this medicine? Visit your doctor or health care professional for regular checks on your progress. Your doctor may order blood tests and other tests to see how you are doing. You should make sure you get enough calcium and vitamin D while you are taking this medicine, unless your doctor tells you not to. Discuss the foods you eat and the vitamins you take with your health care professional. [name] [name] may get drowsy or dizzy. Do not drive, use machinery, or do anything that needs mental alertness until you know how this medicine affects you. Do not stand or sit up quickly, especially if you are an older patient. This reduces the risk of dizzy or fainting spells. What side effects may I notice from receiving this medicine? Side effects that you should report to your doctor or health care professional as soon as possible: -allergic reactions like skin rash, itching or hives, swelling of the face, lips, or tongue -chronic constipation -high blood pressure -irregular heartbeat, chest pain -nausea, vomiting -unusually weak or tired Side effects that usually do not require medical attention (report to your doctor or health care professional if they continue or are bothersome): -bone pain -cough, runny nose -headache -leg cramps -muscle spasms in the back or legs -pain, redness, irritation or swelling at the injection site -stomach upset -trouble sleeping This list may not describe all possible side effects. Call your doctor for medical advice about side effects. You may report side effects to FDA at 1-800-FDA-1088. Where should I keep my medicine? Keep out of the reach of  children. Store in a refrigerator between 2 and 8 degrees C (36 and 46 degrees F). Do not freeze. Recap the pen injector when not in use to protect it from light and damage. Use quickly after taking out of the refrigerator and return to refrigerator right after using. Throw away any unused medicine 28 days after the first injection from the device. Do not use after the expiration date printed on the pen and pen packaging. NOTE: This sheet is a summary. It may not cover all possible information. If you have questions about this medicine, talk to your doctor, pharmacist, or health care provider.  2015, [employer]. (2008-04-22 17:45:37)  How Can I Prevent Falls? Men and women with osteoporosis need to take care not to fall down. Falls can break bones. Some reasons people fall are: Poor vision  Poor balance  Certain diseases that affect how you walk  Some types of medicine, such as sleeping pills.  Some tips to help prevent falls outdoors are: Use a cane or walker  Wear rubber-soled shoes so you don't slip  Walk on grass when sidewalks are  slippery  In winter, put salt or kitty litter on icy sidewalks.  Some ways to help prevent falls indoors are: Keep rooms free of clutter, especially on floors  Use plastic or carpet runners on slippery floors  Wear low-heeled shoes that provide good support  Do not walk in socks, stockings, or slippers  Be sure carpets and area rugs have skid-proof backs or are tacked to the floor  Be sure stairs are well lit and have rails on both sides  Put grab bars on bathroom walls near tub, shower, and toilet  Use a rubber bath mat in the shower or tub  Keep a flashlight next to your bed  Use a sturdy step stool with a handrail and wide steps  Add more lights in rooms (and night lights) Buy a cordless phone to keep with you so that you don't have to rush to the phone       when it rings and so that you can call for help if you fall.   (adapted from  http://www.niams.NightlifePreviews.se)  Dietary sources of calcium and vitamin D:  Calcium content (mg) - http://www.niams.MoviePins.co.za  Fortified oatmeal, 1 packet 350  Sardines, canned in oil, with edible bones, 3 oz. 324  Cheddar cheese, 1 oz. shredded 306  Milk, nonfat, 1 cup 302  Milkshake, 1 cup 300  Yogurt, plain, low-fat, 1 cup 300  Soybeans, cooked, 1 cup 261  Tofu, firm, with calcium,  cup 204  Orange juice, fortified with calcium, 6 oz. 200-260 (varies)  Salmon, canned, with edible bones, 3 oz. 181  Pudding, instant, made with 2% milk,  cup 153  Baked beans, 1 cup [place], 1% milk fat, 1 cup 138  Spaghetti, lasagna, 1 cup 125  Frozen yogurt, vanilla, soft-serve,  cup 103  Ready-to-eat cereal, fortified with calcium, 1 cup 100-1,000 (varies)  Cheese pizza, 1 slice 371  Fortified waffles, 2 100  Turnip greens, boiled,  cup 99  Broccoli, raw, 1 cup 90  Ice cream, vanilla,  cup 85  Soy or rice milk, fortified with calcium, 1 cup 80-500 (varies)   Vitamin D content (International Units, IU) - https://www.ars.usda.gov Cod liver oil, 1 tablespoon 1,360  Swordfish, cooked, 3 oz 566  Salmon (sockeye), cooked, 3 oz 447  Tuna fish, canned in water, drained, 3 oz 154  Orange juice fortified with vitamin D, 1 cup (check product labels, as amount of added vitamin D varies) 137  Milk, nonfat, reduced fat, and whole, vitamin D-fortified, 1 cup 115-124  Yogurt, fortified with 20% of the daily value for vitamin D, 6 oz 80  Margarine, fortified, 1 tablespoon 60  Sardines, canned in oil, drained, 2 sardines 46  Liver, beef, cooked, 3 oz 42  Egg, 1 large (vitamin D is found in yolk) 41  Ready-to-eat cereal, fortified with 10% of the daily value for vitamin D, 0.75-1 cup  40  Cheese, Swiss, 1 oz 6   Exercise for Strong Bones (from [place]) There are two types of exercises that are  important for building and maintaining bone density:  weight-bearing and muscle-strengthening exercises. Weight-bearing Exercises These exercises include activities that make you move against gravity while staying upright. Weight-bearing exercises can be high-impact or low-impact. High-impact weight-bearing exercises help build bones and keep them strong. If you have broken a bone due to osteoporosis or are at risk of breaking a bone, you may need to avoid high-impact exercises. If you're not sure, you should check with your healthcare  provider. Examples of high-impact weight-bearing exercises are:   Dancing   Doing high-impact aerobics   Hiking   Jogging/running   Jumping Rope   Stair climbing   Tennis Low-impact weight-bearing exercises can also help keep bones strong and are a safe alternative if you cannot do high-impact exercises. Examples of low-impact weight-bearing exercises are:   Using elliptical training machines   Doing low-impact aerobics   Using stair-step machines   Fast walking on a treadmill or outside Muscle-Strengthening Exercises These exercises include activities where you move your body, a weight or some other resistance against gravity. They are also known as resistance exercises and include:   Lifting weights   Using elastic exercise bands   Using weight machines   Lifting your own body weight   Functional movements, such as standing and rising up on your toes Yoga and Pilates can also improve strength, balance and flexibility. However, certain positions may not be safe for people with osteoporosis or those at increased risk of broken bones. For example, exercises that have you bend forward may increase the chance of breaking a bone in the spine. A physical therapist should be able to help you learn which exercises are safe and appropriate for you. Non-Impact Exercises Non-impact exercises can help you to improve balance, posture and how well you move in everyday  activities. These exercises can also help to increase muscle strength and decrease the risk of falls and broken bones. Some of these exercises include:   Balance exercises that strengthen your legs and test your balance, such as Tai Chi, can decrease your risk of falls.   Posture exercises that improve your posture and reduce rounded or "sloping" shoulders can help you decrease the chance of breaking a bone, especially in the spine.   Functional exercises that improve how well you move can help you with everyday activities and decrease your chance of falling and breaking a bone. For example, if you have trouble getting up from a chair or climbing stairs, you should do these activities as exercises. A physical therapist can teach you balance, posture and functional exercises. Starting a New Exercise Program If you haven't exercised regularly for a while, check with your healthcare provider before beginning a new exercise program-particularly if you have health problems such as heart disease, diabetes or high blood pressure. If you're at high risk of breaking a bone, you should work with a physical therapist to develop a safe exercise program. Once you have your healthcare provider's approval, start slowly. If you've already broken bones in the spine because of osteoporosis, be very careful to avoid activities that require reaching down, bending forward, rapid twisting motions, heavy lifting and those that increase your chance of a fall. As you get started, your muscles may feel sore for a day or two after you exercise. If soreness lasts longer, you may be working too hard and need to ease up. Exercises should be done in a pain-free range of motion. How Much Exercise Do You Need? Weight-bearing exercises 30 minutes on most days of the week. Do a 30-minutesession or multiple sessions spread out throughout the day. The benefits to your bones are the same.   Muscle-strengthening exercises Two to three days per  week. If you don't have much time for strengthening/resistance training, do small amounts at a time. You can do just one body part each day. For example do arms one day, legs the next and trunk the next. You can also spread these exercises  out during your normal day.  Balance, posture and functional exercises Every day or as often as needed. You may want to focus on one area more than the others. If you have fallen or lose your balance, spend time doing balance exercises. If you are getting rounded shoulders, work more on posture exercises. If you have trouble climbing stairs or getting up from the couch, do more functional exercises. You can also perform these exercises at one time or spread them during your day. Work with a phyiscal therapist to learn the right exercises for you.

## 2014-02-10 NOTE — Progress Notes (Signed)
Patient ID: [name] [name], female   DOB: [birth year], 78 y.o.   MRN: [id number]   HPI  [name] [name] is a 78 y.o.-year-old female, referred by Dr Louanne Skye, for management of osteoporosis - with vertebral compression fractures.  Pt has a h/o vertebral fractures: - L1 fx in 2002 - L2 vb fx (post-traumatic?)  - L3 vb fx  She had kypho- and vertebro-plasties (L3 and L4) - in the last year. She also had L4-L5 laminectomy.   I reviewed pt's DEXA scans: Date L1-L4 T score FN T score 33% distal Radius UD radius  01/16/2013 Not interpretable LFN: -2.7 RFN: -2.5 -1.4 -2.3   She is on the following OP treatments:  - Calcitonin intranasal qod No h/o bisphosphonate use.  No h/o hyper/hypocalcemia. No h/o hyperparathyroidism. No h/o kidney stones. Lab Results  Component Value Date   CALCIUM 9.4 01/15/2014   CALCIUM 9.5 01/09/2014   CALCIUM 9.6 12/21/2013   CALCIUM 9.4 12/12/2013   CALCIUM 9.4 12/05/2013   CALCIUM 9.1 12/04/2013   CALCIUM 9.9 12/03/2013   CALCIUM 9.7 11/20/2013   CALCIUM 10.1 11/14/2013   CALCIUM 9.3 11/05/2013   No h/o thyrotoxicosis. Reviewed TSH recent levels:  Lab Results  Component Value Date   TSH 0.962 09/20/2013   TSH 0.727 08/14/2013   TSH 0.650 06/13/2013   TSH 0.283* 02/16/2010   No h/o vitamin D deficiency. Last vit D level was 83 in 08/14/2013. Pt is on Coral calcium 1000 mg daily and vitamin D 5000 IU daily. She also takes Mg supplements. She also eats dairy and green, leafy, vegetables. She has protein-calorie malnutrition, on Glucerna shakes, not every day.   She had high Aphos in the past and also low albumin, now both normalized.   No weight bearing exercises, but tries to walk daily  She does not take high vitamin A doses.  She was on Prednisone 4-5 mo ago >> just a taper. She had a steroid inj in knee last summer.  No h/o CKD. Last BUN/Cr: Lab Results  Component Value Date   BUN 17 01/15/2014   CREATININE 0.74 01/15/2014   Pt does not have a FH of osteoporosis. No  FH of kyphosis.   I reviewed her chart and she also has a history of GERD, MGUS.  Menopause was in her 65s.  ROS: Constitutional: + both weight gain/loss, no fatigue, no subjective hyperthermia/hypothermia, + poor sleep, + nocturia Eyes: no blurry vision, no xerophthalmia ENT: no sore throat, no nodules palpated in throat, + occ. dysphagia/odynophagia,+ hoarseness Cardiovascular: no CP/SOB/palpitations/leg swelling Respiratory: no cough/SOB/+ wheezing Gastrointestinal: no N/V/D/C Musculoskeletal: no muscle/+ joint aches (back) Skin: no rashes Neurological: no tremors/numbness/tingling/dizziness Psychiatric: + depression/anxiety  Past Medical History  Diagnosis Date  . CAD (coronary artery disease)     LHC 2003 with 40% pLAD, 30% mLAD, 100% D1 (moderate vessel), 100%  D2 (small vessel), 95% mid small OM2.  Pt had PTCA to D1;  D2 and OM2 small vessels and tx medically;  Last Myoview (2012): anterior infarct seen with scar, no ischemia. EF normal. Pt managed medically;  Lexiscan Myoview (12/14):  Low risk; ant scar with very mild peri-infarct ischemia; EF 55% with ant AK   . Recurrent aspiration bronchitis/pneumonia     PFTs 12/09 FVC 97%, FEV1 101%, ratio 77%, TLC 109%. No significant obstruction or restriction.   . Hypertension   . Hyperlipidemia   . Esophageal stricture   . Gastritis   . GERD (gastroesophageal reflux disease)   . Asthma   .  Blood transfusion   . Cough   . Wheezing   . Weakness   . Trouble swallowing   . Abdominal pain   . Hiatal hernia   . Shortness of breath   . Anxiety   . TIA (transient ischemic attack)   . BRONCHITIS, ACUTE 07/25/2007         . DIVERTICULOSIS, COLON 08/28/2008    Qualifier: Diagnosis of  By: [name], [name], [name]    . IRON DEFICIENCY ANEMIA, HX OF 07/24/2007    Qualifier: Diagnosis of  By: Tilden Dome    . Chronic nausea   . Chronic back pain   . PONV (postoperative nausea and vomiting)    Past Surgical History  Procedure  Laterality Date  . Back surgery    . Bladder repair    . Hemorrhoid surgery    . Cholecystectomy      Patient unsure of date.  [name] Appendectomy      patient unsure of date  . Total abdominal hysterectomy  1975    partial  . Coronary angioplasty  11/16/2001  . Eye surgery  11/2000    bilateral cataracts with lens implant  . Hiatal hernia repair  06/03/2011    Procedure: LAPAROSCOPIC REPAIR OF HIATAL HERNIA;  Surgeon: Adin Hector, MD;  Location: [facility];  Service: General;  Laterality: N/A;  . Laparoscopic nissen fundoplication  5/39/7673    Procedure: LAPAROSCOPIC NISSEN FUNDOPLICATION;  Surgeon: Adin Hector, MD;  Location: [facility];  Service: General;  Laterality: N/A;  . Lumbar laminectomy/decompression microdiscectomy Right 06/26/2012    Procedure: LUMBAR LAMINECTOMY/DECOMPRESSION MICRODISCECTOMY;  Surgeon: Jessy Oto, MD;  Location: [facility];  Service: Orthopedics;  Laterality: Right;  RIGHT L4-5 MICRODISCECTOMY  . Esophagogastroduodenoscopy N/A 03/15/2013    Procedure: ESOPHAGOGASTRODUODENOSCOPY (EGD);  Surgeon: Jerene Bears, MD;  Location: [facility];  Service: Endoscopy;  Laterality: N/A;  . Esophageal manometry N/A 03/25/2013    Procedure: ESOPHAGEAL MANOMETRY (EM);  Surgeon: Inda Castle, MD;  Location: [facility];  Service: Endoscopy;  Laterality: N/A;  . Hiatal hernia repair N/A 04/24/2013    Procedure: LAPAROSCOPIC  REPAIR RECURRENT PARASOPHAGEAL HIATAL HERNIA WITH FUNDOPLICATION;  Surgeon: Adin Hector, MD;  Location: [facility];  Service: General;  Laterality: N/A;  . Insertion of mesh N/A 04/24/2013    Procedure: INSERTION OF MESH;  Surgeon: Adin Hector, MD;  Location: [facility];  Service: General;  Laterality: N/A;  . Laparoscopic lysis of adhesions N/A 04/24/2013    Procedure: LAPAROSCOPIC LYSIS OF ADHESIONS;  Surgeon: Adin Hector, MD;  Location: [facility];  Service: General;  Laterality: N/A;  . Fixation kyphoplasty lumbar spine  X 2    "L3-4"   History    Social History  . Marital Status: Married    Spouse Name: N/A    Number of Children: 2   Occupational History  . Retired     worked at [employer]  . Smoking status: Never Smoker   . Smokeless tobacco: Never Used  . Alcohol Use: No  . Drug Use: No   Social History Narrative   Lives with husband.   Current Outpatient Prescriptions on File Prior to Visit  Medication Sig Dispense Refill  . acetaminophen (TYLENOL) 500 MG tablet Take 500 mg by mouth every 6 (six) hours as needed for moderate pain.      . Aclidinium Bromide (TUDORZA PRESSAIR) 400 MCG/ACT AEPB Inhale 1 puff into the lungs 2 (two) times  daily as needed (wheezing).       . ALPRAZolam (XANAX) 0.5 MG tablet Take 1 tablet (0.5 mg total) by mouth 3 (three) times daily as needed for sleep or anxiety.  90 tablet  5  . amLODipine (NORVASC) 10 MG tablet Take 10 mg by mouth daily. For blood pressure      . Cholecalciferol (VITAMIN D3) 2000 UNITS TABS Take 6,000 Units by mouth daily.       . feeding supplement, GLUCERNA SHAKE, (GLUCERNA SHAKE) LIQD Take 237 mLs by mouth 3 (three) times daily between meals.  90 Can  0  . ibuprofen (ADVIL,MOTRIN) 200 MG tablet Take 200 mg by mouth every 6 (six) hours as needed for moderate pain.      [name] losartan (COZAAR) 100 MG tablet Take 100 mg by mouth daily.      . metoCLOPramide (REGLAN) 5 MG tablet Take 1/2 to 1 tablet 3 x day before meals for Nausea  90 tablet  1  . metoprolol tartrate (LOPRESSOR) 25 MG tablet Take 12.5 mg by mouth daily.       . mometasone-formoterol (DULERA) 200-5 MCG/ACT AERO Inhale 2 puffs into the lungs 2 (two) times daily.  13 g  3  . ondansetron (ZOFRAN ODT) 8 MG disintegrating tablet Take 1 tablet (8 mg total) by mouth every 8 (eight) hours as needed for nausea or vomiting. For nausea  30 tablet  5  . OVER THE COUNTER MEDICATION Take 1,000 mg by mouth daily. Coral Calcium 1000mg       . OVER THE COUNTER MEDICATION Take 400 mg by mouth 2  (two) times daily. Magnesium Carbonate 400mg       . oxyCODONE-acetaminophen (PERCOCET) 5-325 MG per tablet Take 1-2 tablets by mouth every 6 (six) hours as needed.  30 tablet  0  . polyethylene glycol (MIRALAX / GLYCOLAX) packet Take 17 g by mouth 2 (two) times daily.      . prochlorperazine (COMPAZINE) 10 MG tablet TAKE 1 TABLET 3 TIMES A DAY BEFORE MEALS  90 tablet  3  . senna (SENOKOT) 8.6 MG TABS tablet Take 2 tablets by mouth at bedtime.       No current facility-administered medications on file prior to visit.   Allergies  Allergen Reactions  . Aspartame Diarrhea and Nausea And Vomiting  . Daliresp [Roflumilast] Nausea And Vomiting  . Tramadol Other (See Comments)    Blurred vision   . Ace Inhibitors Cough    Per Dr Melvyn Novas pulmonology 2013  . Hydrocodone Nausea Only  . Levofloxacin Nausea And Vomiting   Family History  Problem Relation Age of Onset  . Cancer Mother     colon   . Colon cancer Mother 71  . Heart disease Father 62    heart attack   PE: BP 120/62  Pulse 84  Temp(Src) 97.7 F (36.5 C) (Oral)  Resp 12  Ht 5' 3.25" (1.607 m)  Wt 140 lb (63.504 kg)  BMI 24.59 kg/m2  SpO2 94% Wt Readings from Last 3 Encounters:  02/10/14 140 lb (63.504 kg)  01/15/14 133 lb 9.6 oz (60.601 kg)  01/10/14 126 lb (57.153 kg)   Constitutional: normal weight, in NAD. No kyphosis. Eyes: PERRLA, EOMI, no exophthalmos ENT: moist mucous membranes, no thyromegaly, no cervical lymphadenopathy Cardiovascular: RRR, No MRG Respiratory: CTA B Gastrointestinal: abdomen soft, NT, ND, BS+ Musculoskeletal: no deformities, strength intact in all 4 Skin: moist, warm, no rashes Neurological: no tremor with outstretched hands, DTR normal in all 4  Assessment: 1. Osteoporosis  Plan: 1. Osteoporosis - likely postmenopausal  - Discussed about increased risk of fracture, depending on the T score, greatly increased when the T score is lower than -2.5, and especially since she already had  several vb fxs - We reviewed her latest DEXA scan results together with pt and husband - We discussed about the different medication classes, benefits and side effects (including atypical fractures and ONJ - no dental workup in progress or planned). I explained that, since she has GERD and h/o esophageal stricture, she is not a candidate for oral bisphosphonates, so my first choice would be Ia parenteral med. We discussed about zoledronic acid = Reclast or sq denosumab (Prolia), but I actually suggested Teriparatide since this is the most potent and the only one that is bone-building. Pt was given reading information about this, and I explained the mechanism of action and expected benefits.  - we reviewed her dietary and supplemental calcium and vitamin D intake, which I believe are adequate. Pt ends up getting at least 5000 units vitamin D at least 1000 mg of calcium daily. I advised her to continue this - given her specific instructions about food sources for these - see pt instructions  - discussed fall precautions  - given handout from [place] Re: weight bearing exercises - advised to do this every day or at least 5/7 days - we discussed about maintaining a good amount of protein in her diet. The recommended daily protein intake is ~0.8 g per kilogram per day. I advised her to try to aim for this amount (~50g per day), since a diet low in proteins can exacerbate osteoporosis. Also, avoid smoking or >2 drinks of alcohol a day. - We will check the following tests:  Vitamin D  Phosphorus  PTH  24h Urinary calcium - if all normal, will arrange for Forteo if she decides for it (she will think about it - husband encourages her to try - he is a diabetic and can show her how to inject) - will check a new DEXA scan in 2 years after starting Forteo - will see pt back in 3 mo after starting Forteo and will need another UCa then.  Component     Latest Ref Rng 02/10/2014 02/10/2014         10:06 AM 10:06 AM  Calcium     8.7 - 10.3 mg/dL  9.5  PTH     15 - 65 pg/mL  20  Phosphorus     2.3 - 4.6 mg/dL 3.3   VITD     30.00 - 100.00 ng/mL 61.05   Bloodwork normal. Will await UCa.  Component     Latest Ref Rng 02/12/2014  Calcium, Ur      8  Calcium, 24 hour urine     100 - 250 mg/day 104  Creatinine, Urine      74.3  Creatinine, 24H Ur     700 - 1800 mg/day 966  Urine Ca not elevated. We can go ahead with Forteo. PA submitted.

## 2014-02-11 ENCOUNTER — Ambulatory Visit: Payer: Medicare HMO | Admitting: Gastroenterology

## 2014-02-11 LAB — PTH, INTACT AND CALCIUM
Calcium: 9.5 mg/dL (ref 8.7–10.3)
PTH: 20 pg/mL (ref 15–65)

## 2014-02-12 ENCOUNTER — Encounter: Payer: Self-pay | Admitting: Internal Medicine

## 2014-02-12 ENCOUNTER — Other Ambulatory Visit: Payer: Medicare HMO

## 2014-02-13 LAB — CALCIUM, URINE, 24 HOUR
CALCIUM UR: 8 mg/dL
Calcium, 24 hour urine: 104 mg/d (ref 100–250)

## 2014-02-13 LAB — CREATININE, URINE, 24 HOUR
CREATININE 24H UR: 966 mg/d (ref 700–1800)
Creatinine, Urine: 74.3 mg/dL

## 2014-02-17 ENCOUNTER — Ambulatory Visit (INDEPENDENT_AMBULATORY_CARE_PROVIDER_SITE_OTHER): Payer: Medicare HMO | Admitting: Internal Medicine

## 2014-02-17 ENCOUNTER — Encounter: Payer: Self-pay | Admitting: Internal Medicine

## 2014-02-17 VITALS — BP 130/72 | HR 64 | Temp 98.8°F | Resp 16 | Ht 63.75 in | Wt 143.8 lb

## 2014-02-17 DIAGNOSIS — J4541 Moderate persistent asthma with (acute) exacerbation: Secondary | ICD-10-CM

## 2014-02-17 MED ORDER — PREDNISONE 20 MG PO TABS: ORAL_TABLET | ORAL | Status: DC

## 2014-02-17 MED ORDER — AZITHROMYCIN 250 MG PO TABS: ORAL_TABLET | ORAL | Status: DC

## 2014-02-17 MED ORDER — HYDROCODONE-ACETAMINOPHEN 5-325 MG PO TABS: ORAL_TABLET | ORAL | Status: DC

## 2014-02-17 NOTE — Progress Notes (Signed)
Subjective:    Patient ID: [name] [name], female    DOB: [birth year], 78 y.o.   MRN: [id number]  Cough This is a recurrent problem. The current episode started in the past 7 days. The problem has been waxing and waning. The problem occurs constantly. The cough is productive of purulent sputum. Associated symptoms include headaches, nasal congestion, a sore throat and wheezing. Pertinent negatives include no chest pain, chills, ear pain, fever, heartburn, hemoptysis, myalgias, postnasal drip, rash, rhinorrhea, shortness of breath or sweats. She has tried nothing for the symptoms. The treatment provided no relief. Her past medical history is significant for asthma, bronchitis, COPD and environmental allergies.   Medication List   acetaminophen 500 MG tablet  Commonly known as:  TYLENOL  Take 500 mg by mouth every 6 (six) hours as needed for moderate pain.     ALPRAZolam 0.5 MG tablet  Commonly known as:  XANAX  Take 1 tablet (0.5 mg total) by mouth 3 (three) times daily as needed for sleep or anxiety.     amLODipine 10 MG tablet  Commonly known as:  NORVASC  Take 10 mg by mouth daily. For blood pressure     azithromycin 250 MG tablet - new today  Commonly known as:  ZITHROMAX  Take 2 tablets (500 mg) on  Day 1,  followed by 1 tablet (250 mg) once daily on Days 2 through 5.     feeding supplement (GLUCERNA SHAKE) Liqd  Take 237 mLs by mouth 3 (three) times daily between meals.     HYDROcodone-acetaminophen 5-325 MG per tablet - new  today  Commonly known as:  NORCO  Take 1/2 to 1 tablet every 3 to 4 hours as needed for cough or pain     ibuprofen 200 MG tablet  Commonly known as:  ADVIL,MOTRIN  Take 200 mg by mouth every 6 (six) hours as needed for moderate pain.     losartan 100 MG tablet  Commonly known as:  COZAAR  Take 100 mg by mouth daily.     metoCLOPramide 5 MG tablet  Commonly known as:  REGLAN  Take 1/2 to 1 tablet 3 x day before meals for Nausea     metoprolol  tartrate 25 MG tablet  Commonly known as:  LOPRESSOR  Take 12.5 mg by mouth daily.     mometasone-formoterol 200-5 MCG/ACT Aero  Commonly known as:  DULERA  Inhale 2 puffs into the lungs 2 (two) times daily.     ondansetron 8 MG disintegrating tablet  Commonly known as:  ZOFRAN ODT  Take 1 tablet (8 mg total) by mouth every 8 (eight) hours as needed for nausea or vomiting. For nausea     OVER THE COUNTER MEDICATION  Take 1,000 mg by mouth daily. Coral Calcium 1000mg      OVER THE COUNTER MEDICATION  Take 400 mg by mouth 2 (two) times daily. Magnesium Carbonate 400mg      polyethylene glycol packet  Commonly known as:  MIRALAX / GLYCOLAX  Take 17 g by mouth 2 (two) times daily.     predniSONE 20 MG tablet  - New today  Commonly known as:  DELTASONE  1 tab 3 x day for 3 days, then 1 tab 2 x day for 3 days, then 1 tab 1 x day for 5 days     prochlorperazine 10 MG tablet  Commonly known as:  COMPAZINE  TAKE 1 TABLET 3 TIMES A DAY BEFORE MEALS     senna 8.6  MG Tabs tablet  Commonly known as:  SENOKOT  Take 2 tablets by mouth at bedtime.     TUDORZA PRESSAIR 400 MCG/ACT Aepb  Generic drug:  Aclidinium Bromide  Inhale 1 puff into the lungs 2 (two) times daily as needed (wheezing).     Vitamin D3 2000 UNITS Tabs  Take 6,000 Units by mouth daily.     Allergies  Allergen Reactions  . Aspartame Diarrhea and Nausea And Vomiting  . Daliresp [Roflumilast] Nausea And Vomiting  . Tramadol Other (See Comments)    Blurred vision   . Ace Inhibitors Cough    Per Dr Melvyn Novas pulmonology 2013  . Hydrocodone Nausea Only  . Levofloxacin Nausea And Vomiting   Past Medical History  Diagnosis Date  . CAD (coronary artery disease)     LHC 2003 with 40% pLAD, 30% mLAD, 100% D1 (moderate vessel), 100%  D2 (small vessel), 95% mid small OM2.  Pt had PTCA to D1;  D2 and OM2 small vessels and tx medically;  Last Myoview (2012): anterior infarct seen with scar, no ischemia. EF normal. Pt managed  medically;  Lexiscan Myoview (12/14):  Low risk; ant scar with very mild peri-infarct ischemia; EF 55% with ant AK   . Recurrent aspiration bronchitis/pneumonia     PFTs 12/09 FVC 97%, FEV1 101%, ratio 77%, TLC 109%. No significant obstruction or restriction.   . Hypertension   . Hyperlipidemia   . Esophageal stricture   . Gastritis   . GERD (gastroesophageal reflux disease)   . Asthma   . Blood transfusion   . Cough   . Wheezing   . Weakness   . Trouble swallowing   . Abdominal pain   . Hiatal hernia   . Shortness of breath   . Anxiety   . TIA (transient ischemic attack)   . BRONCHITIS, ACUTE 07/25/2007         . DIVERTICULOSIS, COLON 08/28/2008    Qualifier: Diagnosis of  By: [name], [name], [name]    . IRON DEFICIENCY ANEMIA, HX OF 07/24/2007    Qualifier: Diagnosis of  By: Tilden Dome    . Chronic nausea   . Chronic back pain   . PONV (postoperative nausea and vomiting)    Review of Systems  Constitutional: Negative for fever and chills.  HENT: Positive for sinus pressure and sore throat. Negative for ear pain, postnasal drip and rhinorrhea.   Eyes: Negative.   Respiratory: Positive for cough and wheezing. Negative for hemoptysis and shortness of breath.   Cardiovascular: Negative for chest pain.  Gastrointestinal: Negative.  Negative for heartburn.  Musculoskeletal: Negative.  Negative for arthralgias, back pain, gait problem and myalgias.  Skin: Negative for rash.  Allergic/Immunologic: Positive for environmental allergies.  Neurological: Positive for weakness and headaches.   BP 130/72  Pulse 64  Temp(Src) 98.8 F (37.1 C) (Temporal)  Resp 16  Ht 5' 3.75" (1.619 m)  Wt 143 lb 12.8 oz (65.227 kg)  BMI 24.88 kg/m2  Objective:   Physical Exam  Constitutional: She is oriented to person, place, and time. No distress.  HENT:  Mouth/Throat: No oropharyngeal exudate.  Sl tender Frontal & Maxillary areas.  Eyes: Conjunctivae and EOM are normal. Pupils are equal,  round, and reactive to light.  Neck: Normal range of motion. Neck supple. No JVD present. Thyromegaly present.  Cardiovascular: Normal rate, regular rhythm and normal heart sounds.   No murmur heard. Pulmonary/Chest: No stridor. No respiratory distress. She has wheezes. She has rales.  Musculoskeletal:  Normal range of motion. She exhibits no edema.  Neurological: She is alert and oriented to person, place, and time. No cranial nerve deficit. Coordination normal.  Skin: Skin is warm and dry. No rash noted. No erythema. No pallor.   Assessment & Plan:   1. Asthmatic bronchitis, moderate persistent, with acute exacerbation  -Rx Z pak & 1 rf , Prednisone taper, Norco prn cough

## 2014-02-23 ENCOUNTER — Other Ambulatory Visit: Payer: Self-pay | Admitting: Internal Medicine

## 2014-02-24 ENCOUNTER — Other Ambulatory Visit: Payer: Self-pay | Admitting: Internal Medicine

## 2014-02-26 ENCOUNTER — Other Ambulatory Visit: Payer: Self-pay | Admitting: *Deleted

## 2014-02-26 MED ORDER — IPRATROPIUM-ALBUTEROL 0.5-2.5 (3) MG/3ML IN SOLN: RESPIRATORY_TRACT | Status: DC

## 2014-02-27 ENCOUNTER — Other Ambulatory Visit: Payer: Self-pay | Admitting: Internal Medicine

## 2014-03-07 ENCOUNTER — Encounter: Payer: Self-pay | Admitting: Physician Assistant

## 2014-03-07 ENCOUNTER — Ambulatory Visit (INDEPENDENT_AMBULATORY_CARE_PROVIDER_SITE_OTHER): Payer: Medicare HMO | Admitting: Physician Assistant

## 2014-03-07 VITALS — BP 138/64 | HR 78 | Temp 97.8°F | Resp 16 | Ht 63.75 in

## 2014-03-07 DIAGNOSIS — R5383 Other fatigue: Secondary | ICD-10-CM

## 2014-03-07 DIAGNOSIS — R110 Nausea: Secondary | ICD-10-CM

## 2014-03-07 DIAGNOSIS — R350 Frequency of micturition: Secondary | ICD-10-CM

## 2014-03-07 DIAGNOSIS — R531 Weakness: Secondary | ICD-10-CM

## 2014-03-07 DIAGNOSIS — Z79899 Other long term (current) drug therapy: Secondary | ICD-10-CM

## 2014-03-07 LAB — CBC WITH DIFFERENTIAL/PLATELET
BASOS ABS: 0.1 10*3/uL (ref 0.0–0.1)
Basophils Relative: 1 % (ref 0–1)
EOS PCT: 1 % (ref 0–5)
Eosinophils Absolute: 0.1 10*3/uL (ref 0.0–0.7)
HCT: 41 % (ref 36.0–46.0)
Hemoglobin: 13.6 g/dL (ref 12.0–15.0)
LYMPHS ABS: 1 10*3/uL (ref 0.7–4.0)
Lymphocytes Relative: 18 % (ref 12–46)
MCH: 30.4 pg (ref 26.0–34.0)
MCHC: 33.2 g/dL (ref 30.0–36.0)
MCV: 91.5 fL (ref 78.0–100.0)
Monocytes Absolute: 0.5 10*3/uL (ref 0.1–1.0)
Monocytes Relative: 8 % (ref 3–12)
Neutro Abs: 4.1 10*3/uL (ref 1.7–7.7)
Neutrophils Relative %: 72 % (ref 43–77)
PLATELETS: 374 10*3/uL (ref 150–400)
RBC: 4.48 MIL/uL (ref 3.87–5.11)
RDW: 15.2 % (ref 11.5–15.5)
WBC: 5.7 10*3/uL (ref 4.0–10.5)

## 2014-03-07 LAB — BASIC METABOLIC PANEL WITH GFR
BUN: 20 mg/dL (ref 6–23)
CO2: 29 meq/L (ref 19–32)
CREATININE: 0.96 mg/dL (ref 0.50–1.10)
Calcium: 9.5 mg/dL (ref 8.4–10.5)
Chloride: 104 mEq/L (ref 96–112)
GFR, Est African American: 66 mL/min
GFR, Est Non African American: 58 mL/min — ABNORMAL LOW
Glucose, Bld: 108 mg/dL — ABNORMAL HIGH (ref 70–99)
Potassium: 4.3 mEq/L (ref 3.5–5.3)
Sodium: 139 mEq/L (ref 135–145)

## 2014-03-07 LAB — IRON AND TIBC
%SAT: 36 % (ref 20–55)
IRON: 136 ug/dL (ref 42–145)
TIBC: 382 ug/dL (ref 250–470)
UIBC: 246 ug/dL (ref 125–400)

## 2014-03-07 LAB — HEPATIC FUNCTION PANEL
ALBUMIN: 3.9 g/dL (ref 3.5–5.2)
ALT: 16 U/L (ref 0–35)
AST: 14 U/L (ref 0–37)
Alkaline Phosphatase: 67 U/L (ref 39–117)
Bilirubin, Direct: 0.2 mg/dL (ref 0.0–0.3)
Indirect Bilirubin: 0.5 mg/dL (ref 0.2–1.2)
Total Bilirubin: 0.7 mg/dL (ref 0.2–1.2)
Total Protein: 6.3 g/dL (ref 6.0–8.3)

## 2014-03-07 LAB — MAGNESIUM: Magnesium: 2.1 mg/dL (ref 1.5–2.5)

## 2014-03-07 MED ORDER — METOCLOPRAMIDE HCL 5 MG PO TABS: ORAL_TABLET | ORAL | Status: AC

## 2014-03-07 NOTE — Patient Instructions (Addendum)
- Drink water to stay hydrated. - Will call you with lab results. -Continue Zofran and Promethazine and Reglan as prescribed for nausea. - Keep your appointment in December for physical with Dr. Melford Aase.   Fatigue Fatigue is a feeling of tiredness, lack of energy, lack of motivation, or feeling tired all the time. Having enough rest, good nutrition, and reducing stress will normally reduce fatigue. Consult your caregiver if it persists. The nature of your fatigue will help your caregiver to find out its cause. The treatment is based on the cause.  CAUSES  There are many causes for fatigue. Most of the time, fatigue can be traced to one or more of your habits or routines. Most causes fit into one or more of three general areas. They are: Lifestyle problems  Sleep disturbances.  Overwork.  Physical exertion.  Unhealthy habits.  Poor eating habits or eating disorders.  Alcohol and/or drug use .  Lack of proper nutrition (malnutrition). Psychological problems  Stress and/or anxiety problems.  Depression.  Grief.  Boredom. Medical Problems or Conditions  Anemia.  Pregnancy.  Thyroid gland problems.  Recovery from major surgery.  Continuous pain.  Emphysema or asthma that is not well controlled  Allergic conditions.  Diabetes.  Infections (such as mononucleosis).  Obesity.  Sleep disorders, such as sleep apnea.  Heart failure or other heart-related problems.  Cancer.  Kidney disease.  Liver disease.  Effects of certain medicines such as antihistamines, cough and cold remedies, prescription pain medicines, heart and blood pressure medicines, drugs used for treatment of cancer, and some antidepressants. SYMPTOMS  The symptoms of fatigue include:   Lack of energy.  Lack of drive (motivation).  Drowsiness.  Feeling of indifference to the surroundings. DIAGNOSIS  The details of how you feel help guide your caregiver in finding out what is causing the  fatigue. You will be asked about your present and past health condition. It is important to review all medicines that you take, including prescription and non-prescription items. A thorough exam will be done. You will be questioned about your feelings, habits, and normal lifestyle. Your caregiver may suggest blood tests, urine tests, or other tests to look for common medical causes of fatigue.  TREATMENT  Fatigue is treated by correcting the underlying cause. For example, if you have continuous pain or depression, treating these causes will improve how you feel. Similarly, adjusting the dose of certain medicines will help in reducing fatigue.  HOME CARE INSTRUCTIONS   Try to get the required amount of good sleep every night.  Eat a healthy and nutritious diet, and drink enough water throughout the day.  Practice ways of relaxing (including yoga or meditation).  Exercise regularly.  Make plans to change situations that cause stress. Act on those plans so that stresses decrease over time. Keep your work and personal routine reasonable.  Avoid street drugs and minimize use of alcohol.  Start taking a daily multivitamin after consulting your caregiver. SEEK MEDICAL CARE IF:   You have persistent tiredness, which cannot be accounted for.  You have fever.  You have unintentional weight loss.  You have headaches.  You have disturbed sleep throughout the night.  You are feeling sad.  You have constipation.  You have dry skin.  You have gained weight.  You are taking any new or different medicines that you suspect are causing fatigue.  You are unable to sleep at night.  You develop any unusual swelling of your legs or other parts of your  body. SEEK IMMEDIATE MEDICAL CARE IF:   You are feeling confused.  Your vision is blurred.  You feel faint or pass out.  You develop severe headache.  You develop severe abdominal, pelvic, or back pain.  You develop chest pain, shortness  of breath, or an irregular or fast heartbeat.  You are unable to pass a normal amount of urine.  You develop abnormal bleeding such as bleeding from the rectum or you vomit blood.  You have thoughts about harming yourself or committing suicide.  You are worried that you might harm someone else. MAKE SURE YOU:   Understand these instructions.  Will watch your condition.  Will get help right away if you are not doing well or get worse. Document Released: 02/20/2007 Document Revised: 07/18/2011 Document Reviewed: 08/27/2013 [facility] Patient Information 2015 [place], [place]. This information is not intended to replace advice given to you by your health care provider. Make sure you discuss any questions you have with your health care provider.

## 2014-03-07 NOTE — Progress Notes (Signed)
Subjective:    Patient ID: Ezekiel Ina, female    DOB: [birth year], 78 y.o.   MRN: [id number]  Extremity Weakness  Chronicity: Patient has always had nausea, but states she has generalized weakness that has started since last visit on 02/17/14. There has been no history of extremity trauma. The problem occurs constantly. The problem has been unchanged. The patient is experiencing no pain. Associated symptoms include numbness. Pertinent negatives include no fever, inability to bear weight, itching, joint locking, joint swelling, limited range of motion, stiffness or tingling. Associated symptoms comments: Patient reports numbness in right leg that is a chronic issue.. Treatments tried: Patient has Zofran, Promethazine and reglan at home for nausea. The treatment provided mild relief.  Last seen on 02/17/14- Dr. Melford Aase gave pt Z-Pak and Prednisone.  She states she took both medication and finished them both.  Patient states she is having generalized weakness and is tired and wants labs drawn today. Review of Systems  Constitutional: Positive for appetite change and fatigue. Negative for fever, chills, diaphoresis and unexpected weight change.       Patient reports lack of appetite and generalized weakness.   HENT: Positive for rhinorrhea and sinus pressure.   Eyes: Negative.   Respiratory: Positive for cough. Negative for chest tightness, shortness of breath, wheezing and stridor.        Cough once in a while.  Patient reports SOB on exertion occasionally.  Patient has history of asthma.  Cardiovascular: Negative.  Negative for chest pain, palpitations and leg swelling.  Gastrointestinal: Positive for nausea. Negative for vomiting, abdominal pain, diarrhea, constipation and blood in stool.       Patient has history of GERD and hiatal hernia.  Patient sees Dr. Deetta Perla for hiatal hernia issues.  Patient states she does take iron, miralax and stool softener.  Patient denies bright red blood in stool.   Patient states her stool is dark, but that is due to iron pills.  Endocrine: Negative.   Genitourinary: Positive for frequency and difficulty urinating. Negative for dysuria, urgency, flank pain and pelvic pain.       Patient states she goes to the bathroom twice at night.  She states that when she goes to urinate only a little bit comes out.   Musculoskeletal: Positive for extremity weakness. Negative for arthralgias, gait problem, joint swelling, myalgias, neck pain and stiffness.       Denies claudication symptoms.  Skin: Negative.  Negative for itching, pallor and rash.  Allergic/Immunologic: Positive for environmental allergies.  Neurological: Positive for numbness. Negative for dizziness, tingling, syncope, speech difficulty, weakness, light-headedness and headaches.       No vertigo.  Numbness in right leg.  Hematological:       Patient had a transfusion in the past and her husband states she feels the same way she did when she was anemic.    Psychiatric/Behavioral: Negative.  The patient is not nervous/anxious.        Patient states she has depression but denies anxiety and stress.   Past Medical History  Diagnosis Date  . CAD (coronary artery disease)     LHC 2003 with 40% pLAD, 30% mLAD, 100% D1 (moderate vessel), 100%  D2 (small vessel), 95% mid small OM2.  Pt had PTCA to D1;  D2 and OM2 small vessels and tx medically;  Last Myoview (2012): anterior infarct seen with scar, no ischemia. EF normal. Pt managed medically;  Lexiscan Myoview (12/14):  Low risk; ant scar with very mild  peri-infarct ischemia; EF 55% with ant AK   . Recurrent aspiration bronchitis/pneumonia     PFTs 12/09 FVC 97%, FEV1 101%, ratio 77%, TLC 109%. No significant obstruction or restriction.   . Hypertension   . Hyperlipidemia   . Esophageal stricture   . Gastritis   . GERD (gastroesophageal reflux disease)   . Asthma   . Blood transfusion   . Cough   . Wheezing   . Weakness   . Trouble swallowing   .  Abdominal pain   . Hiatal hernia   . Shortness of breath   . Anxiety   . TIA (transient ischemic attack)   . BRONCHITIS, ACUTE 07/25/2007         . DIVERTICULOSIS, COLON 08/28/2008    Qualifier: Diagnosis of  By: [name], [name], [name]    . IRON DEFICIENCY ANEMIA, HX OF 07/24/2007    Qualifier: Diagnosis of  By: Tilden Dome    . Chronic nausea   . Chronic back pain   . PONV (postoperative nausea and vomiting)    Current Outpatient Prescriptions on File Prior to Visit  Medication Sig Dispense Refill  . acetaminophen (TYLENOL) 500 MG tablet Take 500 mg by mouth every 6 (six) hours as needed for moderate pain.      . Aclidinium Bromide (TUDORZA PRESSAIR) 400 MCG/ACT AEPB Inhale 1 puff into the lungs 2 (two) times daily as needed (wheezing).       . ALPRAZolam (XANAX) 0.5 MG tablet Take 1 tablet (0.5 mg total) by mouth 3 (three) times daily as needed for sleep or anxiety.  90 tablet  5  . amLODipine (NORVASC) 10 MG tablet Take 10 mg by mouth daily. For blood pressure      . azithromycin (ZITHROMAX) 250 MG tablet Take 2 tablets (500 mg) on  Day 1,  followed by 1 tablet (250 mg) once daily on Days 2 through 5.  6 each  1  . Cholecalciferol (VITAMIN D3) 2000 UNITS TABS Take 6,000 Units by mouth daily.       . feeding supplement, GLUCERNA SHAKE, (GLUCERNA SHAKE) LIQD Take 237 mLs by mouth 3 (three) times daily between meals.  90 Can  0  . HYDROcodone-acetaminophen (NORCO) 5-325 MG per tablet Take 1/2 to 1 tablet every 3 to 4 hours as needed for cough or pain  50 tablet  0  . ibuprofen (ADVIL,MOTRIN) 200 MG tablet Take 200 mg by mouth every 6 (six) hours as needed for moderate pain.      [name] ipratropium-albuterol (DUONEB) 0.5-2.5 (3) MG/3ML SOLN USE ONE VIAL IN NEBULIZER 4 TIMES DAILY  360 mL  99  . losartan (COZAAR) 100 MG tablet Take 100 mg by mouth daily.      . metoprolol (LOPRESSOR) 50 MG tablet TAKE ONE TABLET BY MOUTH TWICE DAILY FOR BLOOD PRESSURE  180 tablet  1  . mometasone-formoterol  (DULERA) 200-5 MCG/ACT AERO Inhale 2 puffs into the lungs 2 (two) times daily.  13 g  3  . ondansetron (ZOFRAN ODT) 8 MG disintegrating tablet Take 1 tablet (8 mg total) by mouth every 8 (eight) hours as needed for nausea or vomiting. For nausea  30 tablet  5  . OVER THE COUNTER MEDICATION Take 1,000 mg by mouth daily. Coral Calcium 1000mg       . OVER THE COUNTER MEDICATION Take 400 mg by mouth 2 (two) times daily. Magnesium Carbonate 400mg       . polyethylene glycol (MIRALAX / GLYCOLAX) packet Take 17 g by  mouth 2 (two) times daily.      . predniSONE (DELTASONE) 20 MG tablet 1 tab 3 x day for 3 days, then 1 tab 2 x day for 3 days, then 1 tab 1 x day for 5 days  20 tablet  0  . prochlorperazine (COMPAZINE) 10 MG tablet TAKE 1 TABLET 3 TIMES A DAY BEFORE MEALS  90 tablet  3  . senna (SENOKOT) 8.6 MG TABS tablet Take 2 tablets by mouth at bedtime.       No current facility-administered medications on file prior to visit.   Allergies  Allergen Reactions  . Aspartame Diarrhea and Nausea And Vomiting  . Daliresp [Roflumilast] Nausea And Vomiting  . Tramadol Other (See Comments)    Blurred vision   . Biaxin [Clarithromycin]     PATIENT CAN TOLERATE Z-PAK.  Is written on patient's paper chart.  . Erythromycin     PATIENT CAN TOLERATE Z-PAK.  It is written on patient's paper chart.  . Shellfish Allergy     Flushing  . Ace Inhibitors Cough    Per Dr Melvyn Novas pulmonology 2013  . Hydrocodone Nausea Only  . Levofloxacin Nausea And Vomiting  Patient is allergic to MSG- (causes flushing)                                Aspartame- unknown reaction   BP 138/64  Pulse 78  Temp(Src) 97.8 F (36.6 C) (Temporal)  Resp 16  Ht 5' 3.75" (1.619 m)  SpO2 97% Wt Readings from Last 3 Encounters:  02/17/14 143 lb 12.8 oz (65.227 kg)  02/10/14 140 lb (63.504 kg)  01/15/14 133 lb 9.6 oz (60.601 kg)   Objective:   Physical Exam  Nursing note and vitals reviewed. Constitutional: She is oriented to person,  place, and time. She appears well-developed and well-nourished. No distress.  HENT:  Head: Normocephalic.  Right Ear: External ear and ear canal normal. No drainage, swelling or tenderness. Tympanic membrane is not injected, not scarred, not perforated, not erythematous, not retracted and not bulging. No middle ear effusion.  Left Ear: External ear and ear canal normal. No drainage, swelling or tenderness. Tympanic membrane is not injected, not scarred, not perforated, not erythematous, not retracted and not bulging.  No middle ear effusion.  Nose: Nose normal. No mucosal edema, rhinorrhea or sinus tenderness. Right sinus exhibits no maxillary sinus tenderness and no frontal sinus tenderness. Left sinus exhibits no maxillary sinus tenderness and no frontal sinus tenderness.  Mouth/Throat: Uvula is midline, oropharynx is clear and moist and mucous membranes are normal. Mucous membranes are not pale and not dry. No trismus in the jaw. No uvula swelling. No oropharyngeal exudate, posterior oropharyngeal edema, posterior oropharyngeal erythema or tonsillar abscesses.  Turbinates mildly erythematous.  Eyes: Conjunctivae and lids are normal. Pupils are equal, round, and reactive to light. Right eye exhibits no discharge. Left eye exhibits no discharge. No scleral icterus.  Neck: Trachea normal and normal range of motion. Neck supple. Normal carotid pulses and no JVD present. No tracheal tenderness present. Carotid bruit is not present. No tracheal deviation present. No mass and no thyromegaly present.  Cardiovascular: Normal rate, regular rhythm, S1 normal, S2 normal, normal heart sounds, intact distal pulses and normal pulses.  Exam reveals no gallop, no distant heart sounds and no friction rub.   No murmur heard. Pulmonary/Chest: Effort normal and breath sounds normal. No accessory muscle usage or stridor. Not  tachypneic and not bradypneic. No respiratory distress. She has no decreased breath sounds. She has  no wheezes. She has no rhonchi. She has no rales. She exhibits no tenderness.  Abdominal: Soft. Bowel sounds are normal. She exhibits no distension, no fluid wave, no abdominal bruit, no pulsatile midline mass and no mass. There is tenderness in the right upper quadrant and suprapubic area. There is no rebound, no guarding and no CVA tenderness. No hernia.  Mild tenderness in RUQ and suprapubic upon palpation.    Musculoskeletal: Normal range of motion. She exhibits no edema and no tenderness.  Strength is 5/5 in upper and lower extremities bilaterally. Sensation intact bilaterally in upper and lower extremities.  Lymphadenopathy:       Head (right side): No submental, no submandibular, no tonsillar, no preauricular, no posterior auricular and no occipital adenopathy present.       Head (left side): No submental, no submandibular, no tonsillar, no preauricular, no posterior auricular and no occipital adenopathy present.    She has no cervical adenopathy.       Right: No supraclavicular adenopathy present.       Left: No supraclavicular adenopathy present.  Neurological: She is alert and oriented to person, place, and time. She has normal strength. No sensory deficit.  Skin: Skin is warm, dry and intact. No rash noted. She is not diaphoretic. No cyanosis or erythema. No pallor. Nails show no clubbing.  Psychiatric: She has a normal mood and affect. Her speech is normal and behavior is normal. Judgment and thought content normal. Cognition and memory are normal.      Assessment & Plan:  1. Other fatigue Ordered labs listed below to check for infection, anemia, vitamin D deficiency and thyroid dysfunction.   Will monitor and call you with results. Make sure you are drinking plenty of fluids to stay hydrated. - CBC with Differential - TSH - Iron and TIBC - Ferritin - Vit D  25 hydroxy (rtn osteoporosis monitoring)  2. Weakness generalized Ordered labs listed below to check for infection,  anemia, vitamin D deficiency and thyroid dysfunction.   Will monitor and call you with results. - CBC with Differential - Iron and TIBC - Ferritin - Magnesium - Vit D  25 hydroxy (rtn osteoporosis monitoring)  3. Encounter for long-term (current) use of medications Ordered labs listed below to monitor electrolytes, kidneys and liver function. Will monitor and call you with results. - CBC with Differential - BASIC METABOLIC PANEL WITH GFR - Hepatic function panel - TSH - Magnesium - Vit D  25 hydroxy (rtn osteoporosis monitoring)  4. Urinary frequency and Suprapubic tenderness Ordered to check for UTI? Will call you with results.  If positive, then I will send in antibiotic to pharmacy. Make sure you are drinking plenty of fluids to stay hydrated.   If you are still having difficulty urinating, then please call the office. - Urinalysis, Routine w reflex microscopic - Urine culture  5. Nausea without vomiting -Continue the Zofran, Promethazine and Reglan as prescribed. -I refilled the Reglan and sent that to your pharmacy. -The Promethazine was prescribed by Dr. Deetta Perla in May 2015 and patient still has 2 refills left at the pharmacy.  Please call the pharmacy to refill that prescription.  Discussed medication effects and SE's.  Patient agreed with treatment plan. Please keep your follow up appointment with Dr. Melford Aase on 05/06/14 for your physical. Please keep your appointments with your GI doctor.  Carson Bogden, Stephani Police, PA-C 2:37 PM  [facility]

## 2014-03-08 LAB — URINALYSIS, ROUTINE W REFLEX MICROSCOPIC
Bilirubin Urine: NEGATIVE
GLUCOSE, UA: NEGATIVE mg/dL
HGB URINE DIPSTICK: NEGATIVE
Ketones, ur: NEGATIVE mg/dL
LEUKOCYTES UA: NEGATIVE
NITRITE: NEGATIVE
PH: 7 (ref 5.0–8.0)
PROTEIN: NEGATIVE mg/dL
Specific Gravity, Urine: 1.018 (ref 1.005–1.030)
Urobilinogen, UA: 0.2 mg/dL (ref 0.0–1.0)

## 2014-03-08 LAB — TSH: TSH: 0.487 u[IU]/mL (ref 0.350–4.500)

## 2014-03-08 LAB — FERRITIN: Ferritin: 35 ng/mL (ref 10–291)

## 2014-03-08 LAB — VITAMIN D 25 HYDROXY (VIT D DEFICIENCY, FRACTURES): VIT D 25 HYDROXY: 76 ng/mL (ref 30–89)

## 2014-03-09 ENCOUNTER — Other Ambulatory Visit: Payer: Self-pay | Admitting: Physician Assistant

## 2014-03-09 LAB — URINE CULTURE
COLONY COUNT: NO GROWTH
Organism ID, Bacteria: NO GROWTH

## 2014-03-13 ENCOUNTER — Telehealth: Payer: Self-pay | Admitting: *Deleted

## 2014-03-13 NOTE — Telephone Encounter (Signed)
Called pt and lvm advising her that her ins approved the Forteo injections. Advised pt to call back to schedule a time to come in to educate her on how to inj the Forteo.

## 2014-03-14 ENCOUNTER — Ambulatory Visit: Payer: Self-pay | Admitting: Physician Assistant

## 2014-03-24 ENCOUNTER — Telehealth: Payer: Self-pay | Admitting: Internal Medicine

## 2014-03-24 NOTE — Telephone Encounter (Signed)
[name] [name], can you please call her and find out why? She was referred to me by Dr. Louanne Skye for her severe osteoporosis with multiple vertebral fractures, to improve her bone status before a future back surgery. I agree with Dr. Louanne Skye that [place] is the best agent to help with that. Prolia would be a second best, IV Reclast would be a third,  And po bisphosphonates would be last. Forteo does not come in a pill form. I would really like to go ahead with Forteo, but I would also like to know the reason why her PCP did not want her to take the injections, just in case we are missing something.

## 2014-03-24 NOTE — Telephone Encounter (Signed)
We had a long discussion about benefit and SEs at last visit. She will need to decide what she wants to do.

## 2014-03-24 NOTE — Telephone Encounter (Signed)
Called [name] [name] and advised her per Dr Arman Filter note. [name] [name] said that Dr Melford Aase said he could treat her just as well as Dr Cruzita Lederer. [name] [name] is concerned that the medication will make her sick. [name] [name] is going to think about it and call back. Be advised.

## 2014-03-24 NOTE — Telephone Encounter (Signed)
Please read note below and advise.  

## 2014-03-24 NOTE — Telephone Encounter (Signed)
Patient stated that her primary Dr do not want her to take the Forteo Inj. Do they they have it in pill form? Please advise

## 2014-03-27 ENCOUNTER — Ambulatory Visit (INDEPENDENT_AMBULATORY_CARE_PROVIDER_SITE_OTHER): Payer: Medicare HMO | Admitting: Gastroenterology

## 2014-03-27 ENCOUNTER — Encounter: Payer: Self-pay | Admitting: Gastroenterology

## 2014-03-27 VITALS — BP 130/76 | HR 72 | Ht 63.75 in | Wt 137.8 lb

## 2014-03-27 DIAGNOSIS — K5900 Constipation, unspecified: Secondary | ICD-10-CM

## 2014-03-27 DIAGNOSIS — R1032 Left lower quadrant pain: Secondary | ICD-10-CM

## 2014-03-27 DIAGNOSIS — R748 Abnormal levels of other serum enzymes: Secondary | ICD-10-CM

## 2014-03-27 DIAGNOSIS — R740 Nonspecific elevation of levels of transaminase and lactic acid dehydrogenase [LDH]: Secondary | ICD-10-CM

## 2014-03-27 DIAGNOSIS — K5909 Other constipation: Secondary | ICD-10-CM

## 2014-03-27 MED ORDER — LINACLOTIDE 145 MCG PO CAPS: 145.0000 ug | ORAL_CAPSULE | Freq: Every day | ORAL | Status: DC

## 2014-03-27 NOTE — Assessment & Plan Note (Signed)
LFTs are now normal.  Last scan of the biliary tract by CT was unremarkable.  Plan no further workup at this time.

## 2014-03-27 NOTE — Patient Instructions (Signed)
We are sending in your prescription to your pharmacy ([facility]) Call back in one week to let Dr [name] know how you are doing If you should develop diarrhea with the [facility] contact the office

## 2014-03-27 NOTE — Progress Notes (Signed)
      History of Present Illness:  Ms. [name] [name] return for lesion of abdominal pain.  She has very mild, nonspecific bilateral lower abdominal pain which is relieved with a bowel movement she still has trouble swallowing pills but is able to do so when she mixes it with applesauce.  Her main complaint is constipation despite taking MiraLAX.  There is a question of an abnormality on his CT angiogram , 2015 raising the question of an enlarged biliary ductal system.  No abnormalities were seen on subsequent CT of the abdomen.  Liver tests from one month ago were normal.    Review of Systems: Pertinent positive and negative review of systems were noted in the above HPI section. All other review of systems were otherwise negative.    Current Medications, Allergies, Past Medical History, Past Surgical History, Family History and Social History were reviewed in [facility] record  Vital signs were reviewed in today's medical record. Physical Exam: General: Well developed , well nourished, no acute distress Skin: anicteric Head: Normocephalic and atraumatic Eyes:  sclerae anicteric, EOMI Ears: Normal auditory acuity Mouth: No deformity or lesions Lungs: Clear throughout to auscultation Heart: Regular rate and rhythm; no murmurs, rubs or bruits Abdomen: Soft, non tender and non distended. No masses, hepatosplenomegaly or hernias noted. Normal Bowel sounds Rectal:deferred Musculoskeletal: Symmetrical with no gross deformities  Pulses:  Normal pulses noted Extremities: No clubbing, cyanosis, edema or deformities noted Neurological: Alert oriented x 4, grossly nonfocal Psychological:  Alert and cooperative. Normal mood and affect  See Assessment and Plan under Problem List

## 2014-03-27 NOTE — Assessment & Plan Note (Signed)
Pt has functional constipation with inadequate response to MiraLAX.  Recon  Recommendations #1 trial of [facility]

## 2014-03-27 NOTE — Assessment & Plan Note (Signed)
Patient has mild discomfort which is relieved by moving her bowels.  No further workup.  Placed on [facility]

## 2014-03-28 ENCOUNTER — Other Ambulatory Visit: Payer: Self-pay

## 2014-03-28 MED ORDER — ALPRAZOLAM 0.5 MG PO TABS: 0.5000 mg | ORAL_TABLET | Freq: Three times a day (TID) | ORAL | Status: DC | PRN

## 2014-04-10 ENCOUNTER — Ambulatory Visit (INDEPENDENT_AMBULATORY_CARE_PROVIDER_SITE_OTHER): Payer: Medicare HMO | Admitting: *Deleted

## 2014-04-10 DIAGNOSIS — N183 Chronic kidney disease, stage 3 unspecified: Secondary | ICD-10-CM

## 2014-04-10 DIAGNOSIS — R300 Dysuria: Secondary | ICD-10-CM

## 2014-04-10 LAB — URINALYSIS, ROUTINE W REFLEX MICROSCOPIC
BILIRUBIN URINE: NEGATIVE
Glucose, UA: NEGATIVE mg/dL
HGB URINE DIPSTICK: NEGATIVE
KETONES UR: NEGATIVE mg/dL
Leukocytes, UA: NEGATIVE
NITRITE: NEGATIVE
Protein, ur: NEGATIVE mg/dL
Specific Gravity, Urine: 1.015 (ref 1.005–1.030)
UROBILINOGEN UA: 0.2 mg/dL (ref 0.0–1.0)
pH: 7.5 (ref 5.0–8.0)

## 2014-04-10 LAB — BASIC METABOLIC PANEL WITH GFR
BUN: 16 mg/dL (ref 6–23)
CO2: 25 mEq/L (ref 19–32)
Calcium: 9.7 mg/dL (ref 8.4–10.5)
Chloride: 102 mEq/L (ref 96–112)
Creat: 0.85 mg/dL (ref 0.50–1.10)
GFR, Est African American: 77 mL/min
GFR, Est Non African American: 67 mL/min
Glucose, Bld: 176 mg/dL — ABNORMAL HIGH (ref 70–99)
Potassium: 4.3 mEq/L (ref 3.5–5.3)
Sodium: 141 mEq/L (ref 135–145)

## 2014-04-10 NOTE — Progress Notes (Signed)
Patient ID: [name] [name], female   DOB: 11/04/1935, 78 y.o.   MRN: [id number] Patient presents for 1 month recheck BMP, UA, C&S.

## 2014-04-11 LAB — URINE CULTURE
Colony Count: NO GROWTH
Organism ID, Bacteria: NO GROWTH

## 2014-04-22 ENCOUNTER — Emergency Department (HOSPITAL_COMMUNITY): Payer: Medicare HMO

## 2014-04-22 ENCOUNTER — Emergency Department (HOSPITAL_COMMUNITY)
Admission: EM | Admit: 2014-04-22 | Discharge: 2014-04-23 | Disposition: A | Discharge status: Home / Self Care | Payer: Medicare HMO | Attending: Emergency Medicine | Admitting: Emergency Medicine

## 2014-04-22 ENCOUNTER — Encounter (HOSPITAL_COMMUNITY): Payer: Self-pay | Admitting: Physical Medicine and Rehabilitation

## 2014-04-22 DIAGNOSIS — Z8673 Personal history of transient ischemic attack (TIA), and cerebral infarction without residual deficits: Secondary | ICD-10-CM | POA: Diagnosis not present

## 2014-04-22 DIAGNOSIS — J45909 Unspecified asthma, uncomplicated: Secondary | ICD-10-CM | POA: Insufficient documentation

## 2014-04-22 DIAGNOSIS — I2510 Atherosclerotic heart disease of native coronary artery without angina pectoris: Secondary | ICD-10-CM | POA: Insufficient documentation

## 2014-04-22 DIAGNOSIS — R634 Abnormal weight loss: Secondary | ICD-10-CM | POA: Insufficient documentation

## 2014-04-22 DIAGNOSIS — Z8781 Personal history of (healed) traumatic fracture: Secondary | ICD-10-CM | POA: Diagnosis not present

## 2014-04-22 DIAGNOSIS — Z9861 Coronary angioplasty status: Secondary | ICD-10-CM | POA: Insufficient documentation

## 2014-04-22 DIAGNOSIS — M549 Dorsalgia, unspecified: Secondary | ICD-10-CM

## 2014-04-22 DIAGNOSIS — M545 Low back pain: Secondary | ICD-10-CM | POA: Diagnosis present

## 2014-04-22 DIAGNOSIS — G8929 Other chronic pain: Secondary | ICD-10-CM | POA: Diagnosis not present

## 2014-04-22 DIAGNOSIS — Z862 Personal history of diseases of the blood and blood-forming organs and certain disorders involving the immune mechanism: Secondary | ICD-10-CM | POA: Insufficient documentation

## 2014-04-22 DIAGNOSIS — Z7951 Long term (current) use of inhaled steroids: Secondary | ICD-10-CM | POA: Insufficient documentation

## 2014-04-22 DIAGNOSIS — I10 Essential (primary) hypertension: Secondary | ICD-10-CM | POA: Diagnosis not present

## 2014-04-22 DIAGNOSIS — Z9889 Other specified postprocedural states: Secondary | ICD-10-CM | POA: Insufficient documentation

## 2014-04-22 DIAGNOSIS — K219 Gastro-esophageal reflux disease without esophagitis: Secondary | ICD-10-CM | POA: Insufficient documentation

## 2014-04-22 DIAGNOSIS — E785 Hyperlipidemia, unspecified: Secondary | ICD-10-CM | POA: Insufficient documentation

## 2014-04-22 DIAGNOSIS — M810 Age-related osteoporosis without current pathological fracture: Secondary | ICD-10-CM | POA: Diagnosis not present

## 2014-04-22 DIAGNOSIS — M5441 Lumbago with sciatica, right side: Secondary | ICD-10-CM | POA: Diagnosis not present

## 2014-04-22 DIAGNOSIS — F419 Anxiety disorder, unspecified: Secondary | ICD-10-CM | POA: Diagnosis not present

## 2014-04-22 DIAGNOSIS — Z79899 Other long term (current) drug therapy: Secondary | ICD-10-CM | POA: Insufficient documentation

## 2014-04-22 MED ORDER — ONDANSETRON HCL 4 MG/2ML IJ SOLN
4.0000 mg | Freq: Once | INTRAMUSCULAR | Status: AC
  Administered 2014-04-22: 4 mg via INTRAVENOUS
  Filled 2014-04-22: qty 2

## 2014-04-22 MED ORDER — HYDROMORPHONE HCL 1 MG/ML IJ SOLN
0.5000 mg | Freq: Once | INTRAMUSCULAR | Status: AC
  Administered 2014-04-22: 0.5 mg via INTRAVENOUS
  Filled 2014-04-22: qty 1

## 2014-04-22 NOTE — ED Notes (Signed)
Pt ambulated with steady gait, maintaining O2 sat of 99% and pulse of 85.

## 2014-04-22 NOTE — ED Notes (Signed)
Pt presents to department for evaluation of lower back pain radiating down R leg. Ongoing for several weeks, states pain increased today. 9/10 pain upon arrival. Pt is alert and oriented x4.

## 2014-04-22 NOTE — ED Provider Notes (Signed)
CSN: [id number]     Arrival date & time 04/22/14  1744 History   First MD Initiated Contact with Patient 04/22/14 2127     Chief Complaint  Patient presents with  . Leg Pain  . Back Pain     (Consider location/radiation/quality/duration/timing/severity/associated sxs/prior Treatment) HPI Comments: Patient with h/o osteoporosis, lumbar laminectomy, compression fractures s/p kyphoplasty -- presents with acute onset of lower back pain, severe, starting today. Patient denies injury or falling. Patient takes Voltaren at home and this has not been helping. Pain is worse with movement. It radiates down into her right leg. Patient has had the same pain in the past however not this severe. She is ambulatory and has good strength in her legs. No numbness or tingling. Patient denies warning symptoms of back pain including: fecal incontinence, urinary retention or overflow incontinence, night sweats, waking from sleep with back pain, unexplained fevers, h/o cancer, IVDU, recent trauma. Patient notes that she has lost approximate 40 pounds in the last year. Patient denies other medical complaints.   Most recent MRI 11/2013:  1. Unchanged, chronic L1 compression fracture and unchanged height loss of severe L3 and mild L4 compression fractures status post augmentation. No new compression fracture identified. 2. Unchanged, moderate multilevel disc degeneration and facet arthrosis in the lumbar spine resulting in mild spinal stenosis at L2-3 and L3-4 and mild lateral recess narrowing at L4-5.  Patient is a 78 y.o. female presenting with leg pain and back pain. The history is provided by the patient and medical records.  Leg Pain Associated symptoms: back pain   Associated symptoms: no fever   Back Pain Associated symptoms: leg pain   Associated symptoms: no abdominal pain, no chest pain, no dysuria, no fever, no headaches, no numbness, no pelvic pain and no weakness     Past Medical History  Diagnosis  Date  . CAD (coronary artery disease)     LHC 2003 with 40% pLAD, 30% mLAD, 100% D1 (moderate vessel), 100%  D2 (small vessel), 95% mid small OM2.  Pt had PTCA to D1;  D2 and OM2 small vessels and tx medically;  Last Myoview (2012): anterior infarct seen with scar, no ischemia. EF normal. Pt managed medically;  Lexiscan Myoview (12/14):  Low risk; ant scar with very mild peri-infarct ischemia; EF 55% with ant AK   . Recurrent aspiration bronchitis/pneumonia     PFTs 12/09 FVC 97%, FEV1 101%, ratio 77%, TLC 109%. No significant obstruction or restriction.   . Hypertension   . Hyperlipidemia   . Esophageal stricture   . Gastritis   . GERD (gastroesophageal reflux disease)   . Asthma   . Blood transfusion   . Cough   . Wheezing   . Weakness   . Trouble swallowing   . Abdominal pain   . Hiatal hernia   . Shortness of breath   . Anxiety   . TIA (transient ischemic attack)   . BRONCHITIS, ACUTE 07/25/2007         . DIVERTICULOSIS, COLON 08/28/2008    Qualifier: Diagnosis of  By: [name], [name], [name]    . IRON DEFICIENCY ANEMIA, HX OF 07/24/2007    Qualifier: Diagnosis of  By: Tilden Dome    . Chronic nausea   . Chronic back pain   . PONV (postoperative nausea and vomiting)    Past Surgical History  Procedure Laterality Date  . Back surgery    . Bladder repair    . Hemorrhoid surgery    .  Cholecystectomy      Patient unsure of date.  [name] Appendectomy      patient unsure of date  . Total abdominal hysterectomy  1975    partial  . Coronary angioplasty  11/16/2001  . Eye surgery  11/2000    bilateral cataracts with lens implant  . Hiatal hernia repair  06/03/2011    Procedure: LAPAROSCOPIC REPAIR OF HIATAL HERNIA;  Surgeon: Adin Hector, MD;  Location: [facility];  Service: General;  Laterality: N/A;  . Laparoscopic nissen fundoplication  9/32/3557    Procedure: LAPAROSCOPIC NISSEN FUNDOPLICATION;  Surgeon: Adin Hector, MD;  Location: [facility];  Service: General;  Laterality:  N/A;  . Lumbar laminectomy/decompression microdiscectomy Right 06/26/2012    Procedure: LUMBAR LAMINECTOMY/DECOMPRESSION MICRODISCECTOMY;  Surgeon: Jessy Oto, MD;  Location: [facility];  Service: Orthopedics;  Laterality: Right;  RIGHT L4-5 MICRODISCECTOMY  . Esophagogastroduodenoscopy N/A 03/15/2013    Procedure: ESOPHAGOGASTRODUODENOSCOPY (EGD);  Surgeon: Jerene Bears, MD;  Location: [facility];  Service: Endoscopy;  Laterality: N/A;  . Esophageal manometry N/A 03/25/2013    Procedure: ESOPHAGEAL MANOMETRY (EM);  Surgeon: Inda Castle, MD;  Location: [facility];  Service: Endoscopy;  Laterality: N/A;  . Hiatal hernia repair N/A 04/24/2013    Procedure: LAPAROSCOPIC  REPAIR RECURRENT PARASOPHAGEAL HIATAL HERNIA WITH FUNDOPLICATION;  Surgeon: Adin Hector, MD;  Location: [facility];  Service: General;  Laterality: N/A;  . Insertion of mesh N/A 04/24/2013    Procedure: INSERTION OF MESH;  Surgeon: Adin Hector, MD;  Location: [facility];  Service: General;  Laterality: N/A;  . Laparoscopic lysis of adhesions N/A 04/24/2013    Procedure: LAPAROSCOPIC LYSIS OF ADHESIONS;  Surgeon: Adin Hector, MD;  Location: [facility];  Service: General;  Laterality: N/A;  . Fixation kyphoplasty lumbar spine  X 2    "L3-4"   Family History  Problem Relation Age of Onset  . Cancer Mother     colon   . Colon cancer Mother 26  . Heart disease Father 43    heart attack   History  Substance Use Topics  . Smoking status: Never Smoker   . Smokeless tobacco: Never Used  . Alcohol Use: No   OB History    No data available     Review of Systems  Constitutional: Positive for unexpected weight change. Negative for fever.  HENT: Negative for rhinorrhea and sore throat.   Eyes: Negative for redness.  Respiratory: Negative for cough.   Cardiovascular: Negative for chest pain.  Gastrointestinal: Negative for nausea, vomiting, abdominal pain, diarrhea and constipation.       Negative for fecal incontinence.     Genitourinary: Negative for dysuria, hematuria, flank pain, vaginal bleeding, vaginal discharge and pelvic pain.       Negative for urinary incontinence or retention.  Musculoskeletal: Positive for back pain. Negative for myalgias.  Skin: Negative for rash.  Neurological: Negative for weakness, numbness and headaches.       Denies saddle paresthesias.    Allergies  Aspartame; Daliresp; Tramadol; Biaxin; Erythromycin; Shellfish allergy; Ace inhibitors; Hydrocodone; and Levofloxacin  Home Medications   Prior to Admission medications   Medication Sig Start Date End Date Taking? Authorizing Provider  acetaminophen (TYLENOL) 500 MG tablet Take 500 mg by mouth every 6 (six) hours as needed for moderate pain.    Historical Provider, MD  Aclidinium Bromide (TUDORZA PRESSAIR) 400 MCG/ACT AEPB Inhale 1 puff into the lungs 2 (two) times daily as needed (wheezing).  Historical Provider, MD  ALPRAZolam Duanne Moron) 0.5 MG tablet Take 1 tablet (0.5 mg total) by mouth 3 (three) times daily as needed for sleep or anxiety. 03/28/14   [name], MD  amLODipine (NORVASC) 10 MG tablet Take 10 mg by mouth daily. For blood pressure    Historical Provider, MD  Cholecalciferol (VITAMIN D3) 2000 UNITS TABS Take 6,000 Units by mouth daily.     Historical Provider, MD  feeding supplement, GLUCERNA SHAKE, (GLUCERNA SHAKE) LIQD Take 237 mLs by mouth 3 (three) times daily between meals. 12/05/13   Shanker Kristeen Mans, MD  HYDROcodone-acetaminophen (NORCO) 5-325 MG per tablet Take 1/2 to 1 tablet every 3 to 4 hours as needed for cough or pain 02/17/14   [name], MD  ibuprofen (ADVIL,MOTRIN) 200 MG tablet Take 200 mg by mouth every 6 (six) hours as needed for moderate pain.    Historical Provider, MD  ipratropium-albuterol (DUONEB) 0.5-2.5 (3) MG/3ML SOLN USE ONE VIAL IN NEBULIZER 4 TIMES DAILY 02/26/14   [name], MD  Linaclotide [facility]) 145 MCG CAPS capsule Take 1 capsule (145 mcg total) by mouth  daily. 03/27/14   Inda Castle, MD  losartan (COZAAR) 100 MG tablet Take 100 mg by mouth daily.    Historical Provider, MD  metoCLOPramide (REGLAN) 5 MG tablet Take 1/2 to 1 tablet 3 x day before meals for Nausea 03/07/14 05/07/14  Jennifer L Couillard, PA-C  metoprolol (LOPRESSOR) 50 MG tablet TAKE ONE TABLET BY MOUTH TWICE DAILY FOR BLOOD PRESSURE 02/27/14   [name], MD  mometasone-formoterol [facility]) 200-5 MCG/ACT AERO Inhale 2 puffs into the lungs 2 (two) times daily. 07/03/13   Melissa R Smith, PA-C  ondansetron (ZOFRAN ODT) 8 MG disintegrating tablet Take 1 tablet (8 mg total) by mouth every 8 (eight) hours as needed for nausea or vomiting. For nausea 09/20/13 09/21/14  [name], MD  OVER THE COUNTER MEDICATION Take 1,000 mg by mouth daily. Coral Calcium 1000mg     Historical Provider, MD  OVER THE COUNTER MEDICATION Take 400 mg by mouth 2 (two) times daily. Magnesium Carbonate 400mg     Historical Provider, MD  polyethylene glycol (MIRALAX / GLYCOLAX) packet Take 17 g by mouth 2 (two) times daily.    Historical Provider, MD  prochlorperazine (COMPAZINE) 10 MG tablet TAKE 1 TABLET 3 TIMES A DAY BEFORE MEALS 01/20/14   [name], MD  promethazine (PHENERGAN) 12.5 MG tablet Take 12.5 mg by mouth every 6 (six) hours as needed for nausea or vomiting. Prescribed by Dr. Lysle Morales- 20 tablets    Historical Provider, MD  senna (SENOKOT) 8.6 MG TABS tablet Take 2 tablets by mouth at bedtime. 12/05/13   Shanker Kristeen Mans, MD   BP 127/84 mmHg  Pulse 75  Temp(Src) 98.1 F (36.7 C) (Oral)  Resp 18  SpO2 96%   Physical Exam  Constitutional: She appears well-developed and well-nourished. She appears distressed.  HENT:  Head: Normocephalic and atraumatic.  Eyes: Conjunctivae are normal.  Neck: Normal range of motion. Neck supple.  Pulmonary/Chest: Effort normal.  Abdominal: Soft. There is no tenderness. There is no CVA tenderness.  Musculoskeletal: She exhibits tenderness. She  exhibits no edema.       Right hip: Normal.       Left hip: Normal.       Cervical back: She exhibits normal range of motion, no tenderness and no bony tenderness.       Thoracic back: She exhibits normal range of motion, no tenderness and no  bony tenderness.       Lumbar back: She exhibits tenderness and bony tenderness.       Back:  No step-off noted with palpation of spine.   Neurological: She is alert. She has normal strength and normal reflexes. No sensory deficit.  5/5 strength in entire lower extremities bilaterally. No sensation deficit.   Skin: Skin is warm and dry. No rash noted.  Psychiatric: She has a normal mood and affect.  Nursing note and vitals reviewed.   ED Course  Procedures (including critical care time) Labs Review Labs Reviewed - No data to display  Imaging Review Dg Lumbar Spine Complete  04/22/2014   CLINICAL DATA:  New onset back pain.  No known injury.  EXAM: LUMBAR SPINE - COMPLETE 4+ VIEW  COMPARISON:  12/03/2013  FINDINGS: L2 vertebral body height appears subtly decreased from prior, and there is evidence of new sclerosis in the inferior body. Findings are suspicious for a minimally compressed insufficiency fracture.  Remote fractures of the L1, L3, L4 vertebral bodies, status post bone augmentation at L3 and L4. Cement orientation is stable from prior. There is no evidence of acute on chronic fracture at these levels.  Osteopenia which is marked.  Multilevel degenerative disc disease, focally advanced at L5-S1.  IMPRESSION: 1. Question minimally compressed insufficiency fracture at L2. 2. Remote L1, L3, and L4 compression fractures.   Electronically Signed   By: Jorje Guild M.D.   On: 04/22/2014 23:24     EKG Interpretation None       9:57 PM Patient seen and examined. Work-up initiated. Medications ordered.   Vital signs reviewed and are as follows: BP 127/84 mmHg  Pulse 75  Temp(Src) 98.1 F (36.7 C) (Oral)  Resp 18  SpO2 96%  12:47 AM  Patient was discussed with Dr. Thurnell Garbe.   Pain controlled with IV meds. Patient ambulated well. She has some residual R hip pain. No deterioration or new neurological deficits of her exam during ED stay.  I discussed x-ray findings with patient and husband. Possible mild compression fracture noted. Discussed the plan at this time will be for pain control.  Will give patient a stronger pain medication for home. I discussed with the patient and husband to take lowest dose needed to control pain and use only under supervision due to fall risk. Patient and her husband verbalized understanding. Also Zofran as hydrocodone has made patient nauseous in the past.  Patient was counseled on back pain precautions and told to do activity as tolerated but do not lift, push, or pull heavy objects more than 10 pounds for the next week.  Patient counseled to use ice or heat on back for no longer than 15 minutes every hour.   Patient urged to follow-up with PCP if pain does not improve with treatment and rest or if pain becomes recurrent. Urged to return with worsening severe pain, loss of bowel or bladder control, trouble walking.   The patient verbalizes understanding and agrees with the plan.   MDM   Final diagnoses:  Back pain  Midline low back pain with right-sided sciatica   Patient with back pain. No neurological deficits. Patient is ambulatory. No warning symptoms of back pain including: fecal incontinence, urinary retention or overflow incontinence, night sweats, waking from sleep with back pain, unexplained fevers or weight loss, h/o cancer, IVDU, recent trauma. No concern for cauda equina, epidural abscess, or other serious cause of back pain. Imaging shows possible new subtle L2 compression fracture.  Pain is controlled. Conservative measures such as rest, ice/heat and pain medicine indicated with PCP follow-up if no improvement with conservative management.        Carlisle Cater, PA-C 04/23/14  [place], DO 04/25/14 1351

## 2014-04-23 MED ORDER — ONDANSETRON 4 MG PO TBDP: 4.0000 mg | ORAL_TABLET | Freq: Three times a day (TID) | ORAL | Status: DC | PRN

## 2014-04-23 MED ORDER — HYDROCODONE-ACETAMINOPHEN 5-325 MG PO TABS
1.0000 | ORAL_TABLET | Freq: Once | ORAL | Status: AC
  Administered 2014-04-23: 1 via ORAL
  Filled 2014-04-23: qty 1

## 2014-04-23 MED ORDER — ONDANSETRON 4 MG PO TBDP
4.0000 mg | ORAL_TABLET | Freq: Once | ORAL | Status: AC
  Administered 2014-04-23: 4 mg via ORAL
  Filled 2014-04-23: qty 1

## 2014-04-23 MED ORDER — HYDROCODONE-ACETAMINOPHEN 5-325 MG PO TABS: ORAL_TABLET | ORAL | Status: DC

## 2014-04-23 NOTE — Discharge Instructions (Signed)
Please read and follow all provided instructions.  Your diagnoses today include:  1. Back pain   2. Midline low back pain with right-sided sciatica     Tests performed today include:  Vital signs - see below for your results today  X-ray of your back - shows possible subtle compression fracture in the 2nd lumbar vertebra  Medications prescribed:   Vicodin (hydrocodone/acetaminophen) - narcotic pain medication  DO NOT drive or perform any activities that require you to be awake and alert because this medicine can make you drowsy. BE VERY CAREFUL not to take multiple medicines containing Tylenol (also called acetaminophen). Doing so can lead to an overdose which can damage your liver and cause liver failure and possibly death.  Use pain medication only under direct supervision at the lowest possible dose needed to control your pain. You are at risk for falls while taking this medication.    Zofran (ondansetron) - for nausea and vomiting   Take any prescribed medications only as directed.  Home care instructions:   Follow any educational materials contained in this packet  Please rest, use ice or heat on your back for the next several days  Do not lift, push, pull anything more than 10 pounds for the next week  Follow-up instructions: Please follow-up with your primary care provider in the next 1 week for further evaluation of your symptoms.   Return instructions:  SEEK IMMEDIATE MEDICAL ATTENTION IF YOU HAVE:  New numbness, tingling, weakness, or problem with the use of your arms or legs  Severe back pain not relieved with medications  Loss control of your bowels or bladder  Increasing pain in any areas of the body (such as chest or abdominal pain)  Shortness of breath, dizziness, or fainting.   Worsening nausea (feeling sick to your stomach), vomiting, fever, or sweats  Any other emergent concerns regarding your health   Additional Information:  Your vital signs  today were: BP 176/74 mmHg   Pulse 71   Temp(Src) 98.1 F (36.7 C) (Oral)   Resp 18   SpO2 100% If your blood pressure (BP) was elevated above 135/85 this visit, please have this repeated by your doctor within one month. --------------

## 2014-04-28 ENCOUNTER — Other Ambulatory Visit: Payer: Self-pay | Admitting: Internal Medicine

## 2014-04-28 MED ORDER — HYDROCODONE-ACETAMINOPHEN 5-325 MG PO TABS: ORAL_TABLET | ORAL | Status: AC

## 2014-05-06 ENCOUNTER — Ambulatory Visit (INDEPENDENT_AMBULATORY_CARE_PROVIDER_SITE_OTHER): Payer: Medicare Other | Admitting: Internal Medicine

## 2014-05-06 ENCOUNTER — Encounter: Payer: Self-pay | Admitting: Internal Medicine

## 2014-05-06 ENCOUNTER — Other Ambulatory Visit: Payer: Self-pay | Admitting: Internal Medicine

## 2014-05-06 VITALS — BP 142/74 | HR 68 | Temp 98.1°F | Resp 18 | Ht 63.75 in | Wt 134.0 lb

## 2014-05-06 DIAGNOSIS — Z0000 Encounter for general adult medical examination without abnormal findings: Secondary | ICD-10-CM

## 2014-05-06 DIAGNOSIS — E782 Mixed hyperlipidemia: Secondary | ICD-10-CM

## 2014-05-06 DIAGNOSIS — I25810 Atherosclerosis of coronary artery bypass graft(s) without angina pectoris: Secondary | ICD-10-CM

## 2014-05-06 DIAGNOSIS — E559 Vitamin D deficiency, unspecified: Secondary | ICD-10-CM

## 2014-05-06 DIAGNOSIS — D472 Monoclonal gammopathy: Secondary | ICD-10-CM

## 2014-05-06 DIAGNOSIS — IMO0001 Reserved for inherently not codable concepts without codable children: Secondary | ICD-10-CM

## 2014-05-06 DIAGNOSIS — Z1212 Encounter for screening for malignant neoplasm of rectum: Secondary | ICD-10-CM

## 2014-05-06 DIAGNOSIS — I10 Essential (primary) hypertension: Secondary | ICD-10-CM

## 2014-05-06 DIAGNOSIS — Z23 Encounter for immunization: Secondary | ICD-10-CM

## 2014-05-06 DIAGNOSIS — M4850XS Collapsed vertebra, not elsewhere classified, site unspecified, sequela of fracture: Secondary | ICD-10-CM

## 2014-05-06 DIAGNOSIS — Z0001 Encounter for general adult medical examination with abnormal findings: Secondary | ICD-10-CM

## 2014-05-06 DIAGNOSIS — Z9181 History of falling: Secondary | ICD-10-CM

## 2014-05-06 DIAGNOSIS — Z79899 Other long term (current) drug therapy: Secondary | ICD-10-CM

## 2014-05-06 DIAGNOSIS — R7303 Prediabetes: Secondary | ICD-10-CM

## 2014-05-06 DIAGNOSIS — R6889 Other general symptoms and signs: Secondary | ICD-10-CM

## 2014-05-06 DIAGNOSIS — M5126 Other intervertebral disc displacement, lumbar region: Secondary | ICD-10-CM

## 2014-05-06 DIAGNOSIS — Z1331 Encounter for screening for depression: Secondary | ICD-10-CM

## 2014-05-06 DIAGNOSIS — M810 Age-related osteoporosis without current pathological fracture: Secondary | ICD-10-CM

## 2014-05-06 LAB — CBC WITH DIFFERENTIAL/PLATELET
Basophils Absolute: 0 10*3/uL (ref 0.0–0.1)
Basophils Relative: 1 % (ref 0–1)
Eosinophils Absolute: 0.2 10*3/uL (ref 0.0–0.7)
Eosinophils Relative: 5 % (ref 0–5)
HCT: 42.9 % (ref 36.0–46.0)
HEMOGLOBIN: 14.3 g/dL (ref 12.0–15.0)
LYMPHS ABS: 1.5 10*3/uL (ref 0.7–4.0)
LYMPHS PCT: 31 % (ref 12–46)
MCH: 30.9 pg (ref 26.0–34.0)
MCHC: 33.3 g/dL (ref 30.0–36.0)
MCV: 92.7 fL (ref 78.0–100.0)
MPV: 10.5 fL (ref 8.6–12.4)
Monocytes Absolute: 0.5 10*3/uL (ref 0.1–1.0)
Monocytes Relative: 11 % (ref 3–12)
NEUTROS PCT: 52 % (ref 43–77)
Neutro Abs: 2.5 10*3/uL (ref 1.7–7.7)
PLATELETS: 382 10*3/uL (ref 150–400)
RBC: 4.63 MIL/uL (ref 3.87–5.11)
RDW: 14.8 % (ref 11.5–15.5)
WBC: 4.8 10*3/uL (ref 4.0–10.5)

## 2014-05-06 LAB — HEMOGLOBIN A1C
Hgb A1c MFr Bld: 6 % — ABNORMAL HIGH (ref <5.7)
MEAN PLASMA GLUCOSE: 126 mg/dL — AB (ref <117)

## 2014-05-06 NOTE — Patient Instructions (Signed)
Recommend the book "The END of DIETING" by Dr Baker Janus   and the book "The END of DIABETES " by Dr Excell Seltzer  At [email or web address] - get book & Audio CD's      Being diabetic has a  300% increased risk for heart attack, stroke, cancer, and alzheimer- type vascular dementia. It is very important that you work harder with diet by avoiding all foods that are white except chicken & fish. Avoid white rice (brown & wild rice is OK), white potatoes (sweetpotatoes in moderation is OK), White bread or wheat bread or anything made out of white flour like bagels, donuts, rolls, buns, biscuits, cakes, pastries, cookies, pizza crust, and pasta (made from white flour & egg whites) - vegetarian pasta or spinach or wheat pasta is OK. Multigrain breads like Arnold's or [employer], or multigrain sandwich thins or flatbreads.  Diet, exercise and weight loss can reverse and cure diabetes in the early stages.  Diet, exercise and weight loss is very important in the control and prevention of complications of diabetes which affects every system in your body, ie. Brain - dementia/stroke, eyes - glaucoma/blindness, heart - heart attack/heart failure, kidneys - dialysis, stomach - gastric paralysis, intestines - malabsorption, nerves - severe painful neuritis, circulation - gangrene & loss of a leg(s), and finally cancer and Alzheimers.    I recommend avoid fried & greasy foods,  sweets/candy, white rice (brown or wild rice or Quinoa is OK), white potatoes (sweet potatoes are OK) - anything made from white flour - bagels, doughnuts, rolls, buns, biscuits,white and wheat breads, pizza crust and traditional pasta made of white flour & egg white(vegetarian pasta or spinach or wheat pasta is OK).  Multi-grain bread is OK - like multi-grain flat bread or sandwich thins. Avoid alcohol in excess. Exercise is also important.    Eat all the vegetables you want - avoid meat, especially red meat and dairy - especially cheese.  Cheese  is the most concentrated form of trans-fats which is the worst thing to clog up our arteries. Veggie cheese is OK which can be found in the fresh produce section at [employer] or [employer] or [employer]  Preventive Care for Adults A healthy lifestyle and preventive care can promote health and wellness. Preventive health guidelines for women include the following key practices.  A routine yearly physical is a good way to check with your health care provider about your health and preventive screening. It is a chance to share any concerns and updates on your health and to receive a thorough exam.  Visit your dentist for a routine exam and preventive care every 6 months. Brush your teeth twice a day and floss once a day. Good oral hygiene prevents tooth decay and gum disease.  The frequency of eye exams is based on your age, health, family medical history, use of contact lenses, and other factors. Follow your health care provider's recommendations for frequency of eye exams.  Eat a healthy diet. Foods like vegetables, fruits, whole grains, low-fat dairy products, and lean protein foods contain the nutrients you need without too many calories. Decrease your intake of foods high in solid fats, added sugars, and salt. Eat the right amount of calories for you.Get information about a proper diet from your health care provider, if necessary.  Regular physical exercise is one of the most important things you can do for your health. Most adults should get at least 150 minutes of moderate-intensity exercise (any activity that increases  your heart rate and causes you to sweat) each week. In addition, most adults need muscle-strengthening exercises on 2 or more days a week.  Maintain a healthy weight. The body mass index (BMI) is a screening tool to identify possible weight problems. It provides an estimate of body fat based on height and weight. Your health care provider can find your BMI and can help you  achieve or maintain a healthy weight.For adults 20 years and older:  A BMI below 18.5 is considered underweight.  A BMI of 18.5 to 24.9 is normal.  A BMI of 25 to 29.9 is considered overweight.  A BMI of 30 and above is considered obese.  Maintain normal blood lipids and cholesterol levels by exercising and minimizing your intake of saturated fat. Eat a balanced diet with plenty of fruit and vegetables. If your lipid or cholesterol levels are high, you are over 50, or you are at high risk for heart disease, you may need your cholesterol levels checked more frequently.Ongoing high lipid and cholesterol levels should be treated with medicines if diet and exercise are not working.  If you smoke, find out from your health care provider how to quit. If you do not use tobacco, do not start.  Lung cancer screening is recommended for adults aged 55-80 years who are at high risk for developing lung cancer because of a history of smoking. A yearly low-dose CT scan of the lungs is recommended for people who have at least a 30-pack-year history of smoking and are a current smoker or have quit within the past 15 years. A pack year of smoking is smoking an average of 1 pack of cigarettes a day for 1 year (for example: 1 pack a day for 30 years or 2 packs a day for 15 years). Yearly screening should continue until the smoker has stopped smoking for at least 15 years. Yearly screening should be stopped for people who develop a health problem that would prevent them from having lung cancer treatment.  Avoid use of street drugs. Do not share needles with anyone. Ask for help if you need support or instructions about stopping the use of drugs.  High blood pressure causes heart disease and increases the risk of stroke.  Ongoing high blood pressure should be treated with medicines if weight loss and exercise do not work.  If you are 55-79 years old, ask your health care provider if you should take aspirin to  prevent strokes.  Diabetes screening involves taking a blood sample to check your fasting blood sugar level. This should be done once every 3 years, after age 45, if you are within normal weight and without risk factors for diabetes. Testing should be considered at a younger age or be carried out more frequently if you are overweight and have at least 1 risk factor for diabetes.  Breast cancer screening is essential preventive care for women. You should practice "breast self-awareness." This means understanding the normal appearance and feel of your breasts and may include breast self-examination. Any changes detected, no matter how small, should be reported to a health care provider. Women in their 20s and 30s should have a clinical breast exam (CBE) by a health care provider as part of a regular health exam every 1 to 3 years. After age 40, women should have a CBE every year. Starting at age 40, women should consider having a mammogram (breast X-ray test) every year. Women who have a family history of breast cancer should   talk to their health care provider about genetic screening. Women at a high risk of breast cancer should talk to their health care providers about having an MRI and a mammogram every year.  Breast cancer gene (BRCA)-related cancer risk assessment is recommended for women who have family members with BRCA-related cancers. BRCA-related cancers include breast, ovarian, tubal, and peritoneal cancers. Having family members with these cancers may be associated with an increased risk for harmful changes (mutations) in the breast cancer genes BRCA1 and BRCA2. Results of the assessment will determine the need for genetic counseling and BRCA1 and BRCA2 testing.  Routine pelvic exams to screen for cancer are no longer recommended for nonpregnant women who are considered low risk for cancer of the pelvic organs (ovaries, uterus, and vagina) and who do not have symptoms. Ask your health care provider  if a screening pelvic exam is right for you.  If you have had past treatment for cervical cancer or a condition that could lead to cancer, you need Pap tests and screening for cancer for at least 20 years after your treatment. If Pap tests have been discontinued, your risk factors (such as having a new sexual partner) need to be reassessed to determine if screening should be resumed. Some women have medical problems that increase the chance of getting cervical cancer. In these cases, your health care provider may recommend more frequent screening and Pap tests.    Colorectal cancer can be detected and often prevented. Most routine colorectal cancer screening begins at the age of 90 years and continues through age 8 years. However, your health care provider may recommend screening at an earlier age if you have risk factors for colon cancer. On a yearly basis, your health care provider may provide home test kits to check for hidden blood in the stool. Use of a small camera at the end of a tube, to directly examine the colon (sigmoidoscopy or colonoscopy), can detect the earliest forms of colorectal cancer. Talk to your health care provider about this at age 86, when routine screening begins. Direct exam of the colon should be repeated every 5-10 years through age 32 years, unless early forms of pre-cancerous polyps or small growths are found.  Osteoporosis is a disease in which the bones lose minerals and strength with aging. This can result in serious bone fractures or breaks. The risk of osteoporosis can be identified using a bone density scan. Women ages 75 years and over and women at risk for fractures or osteoporosis should discuss screening with their health care providers. Ask your health care provider whether you should take a calcium supplement or vitamin D to reduce the rate of osteoporosis.  Menopause can be associated with physical symptoms and risks. Hormone replacement therapy is available to  decrease symptoms and risks. You should talk to your health care provider about whether hormone replacement therapy is right for you.  Use sunscreen. Apply sunscreen liberally and repeatedly throughout the day. You should seek shade when your shadow is shorter than you. Protect yourself by wearing long sleeves, pants, a wide-brimmed hat, and sunglasses year round, whenever you are outdoors.  Once a month, do a whole body skin exam, using a mirror to look at the skin on your back. Tell your health care provider of new moles, moles that have irregular borders, moles that are larger than a pencil eraser, or moles that have changed in shape or color.  Stay current with required vaccines (immunizations).  Influenza vaccine. All adults  should be immunized every year.  Tetanus, diphtheria, and acellular pertussis (Td, Tdap) vaccine. Pregnant women should receive 1 dose of Tdap vaccine during each pregnancy. The dose should be obtained regardless of the length of time since the last dose. Immunization is preferred during the 27th-36th week of gestation. An adult who has not previously received Tdap or who does not know her vaccine status should receive 1 dose of Tdap. This initial dose should be followed by tetanus and diphtheria toxoids (Td) booster doses every 10 years. Adults with an unknown or incomplete history of completing a 3-dose immunization series with Td-containing vaccines should begin or complete a primary immunization series including a Tdap dose. Adults should receive a Td booster every 10 years.    Zoster vaccine. One dose is recommended for adults aged 44 years or older unless certain conditions are present.    Pneumococcal 13-valent conjugate (PCV13) vaccine. When indicated, a person who is uncertain of her immunization history and has no record of immunization should receive the PCV13 vaccine. An adult aged 74 years or older who has certain medical conditions and has not been previously  immunized should receive 1 dose of PCV13 vaccine. This PCV13 should be followed with a dose of pneumococcal polysaccharide (PPSV23) vaccine. The PPSV23 vaccine dose should be obtained at least 8 weeks after the dose of PCV13 vaccine. An adult aged 66 years or older who has certain medical conditions and previously received 1 or more doses of PPSV23 vaccine should receive 1 dose of PCV13. The PCV13 vaccine dose should be obtained 1 or more years after the last PPSV23 vaccine dose.    Pneumococcal polysaccharide (PPSV23) vaccine. When PCV13 is also indicated, PCV13 should be obtained first. All adults aged 89 years and older should be immunized. An adult younger than age 80 years who has certain medical conditions should be immunized. Any person who resides in a nursing home or long-term care facility should be immunized. An adult smoker should be immunized. People with an immunocompromised condition and certain other conditions should receive both PCV13 and PPSV23 vaccines. People with human immunodeficiency virus (HIV) infection should be immunized as soon as possible after diagnosis. Immunization during chemotherapy or radiation therapy should be avoided. Routine use of PPSV23 vaccine is not recommended for American Indians, [place] Natives, or people younger than 65 years unless there are medical conditions that require PPSV23 vaccine. When indicated, people who have unknown immunization and have no record of immunization should receive PPSV23 vaccine. One-time revaccination 5 years after the first dose of PPSV23 is recommended for people aged 19-64 years who have chronic kidney failure, nephrotic syndrome, asplenia, or immunocompromised conditions. People who received 1-2 doses of PPSV23 before age 34 years should receive another dose of PPSV23 vaccine at age 31 years or later if at least 5 years have passed since the previous dose. Doses of PPSV23 are not needed for people immunized with PPSV23 at or after  age 29 years.   Preventive Services / Frequency  Ages 55 years and over  Blood pressure check.  Lipid and cholesterol check.  Lung cancer screening. / Every year if you are aged 29-80 years and have a 30-pack-year history of smoking and currently smoke or have quit within the past 15 years. Yearly screening is stopped once you have quit smoking for at least 15 years or develop a health problem that would prevent you from having lung cancer treatment.  Clinical breast exam.** / Every year after age 40 years.  BRCA-related cancer risk assessment.** / For women who have family members with a BRCA-related cancer (breast, ovarian, tubal, or peritoneal cancers).  Mammogram.** / Every year beginning at age 40 years and continuing for as long as you are in good health. Consult with your health care provider.  Pap test.** / Every 3 years starting at age 30 years through age 65 or 70 years with 3 consecutive normal Pap tests. Testing can be stopped between 65 and 70 years with 3 consecutive normal Pap tests and no abnormal Pap or HPV tests in the past 10 years.  Fecal occult blood test (FOBT) of stool. / Every year beginning at age 50 years and continuing until age 75 years. You may not need to do this test if you get a colonoscopy every 10 years.  Flexible sigmoidoscopy or colonoscopy.** / Every 5 years for a flexible sigmoidoscopy or every 10 years for a colonoscopy beginning at age 50 years and continuing until age 75 years.  Hepatitis C blood test.** / For all people born from 1945 through 1965 and any individual with known risks for hepatitis C.  Osteoporosis screening.** / A one-time screening for women ages 65 years and over and women at risk for fractures or osteoporosis.  Skin self-exam. / Monthly.  Influenza vaccine. / Every year.  Tetanus, diphtheria, and acellular pertussis (Tdap/Td) vaccine.** / 1 dose of Td every 10 years.  Zoster vaccine.** / 1 dose for adults aged 60 years  or older.  Pneumococcal 13-valent conjugate (PCV13) vaccine.** / Consult your health care provider.  Pneumococcal polysaccharide (PPSV23) vaccine.** / 1 dose for all adults aged 65 years and older. Screening for abdominal aortic aneurysm (AAA)  by ultrasound is recommended for people who have history of high blood pressure or who are current or former smokers. 

## 2014-05-06 NOTE — Progress Notes (Signed)
Patient ID: [name] [name], female   DOB: [birth year], 78 y.o.   MRN: [id number]  [facility] VISIT AND CPE  Assessment:   1. Essential hypertension  - Microalbumin / creatinine urine ratio - [place], RETROPERITNL ABD,  LTD - TSH  2. ASCAD, s/p PTCA/Stent (2003)   - EKG 12-Lead  3. Hyperlipidemia  - Lipid panel  4. Prediabetes  - Hemoglobin A1c - Insulin, fasting  5. Vitamin D deficiency  - Vit D  25 hydroxy (rtn osteoporosis monitoring)  6. Screening for rectal cancer  - POC Hemoccult Bld/Stl (3-Cd Home Screen); Future  7. At low risk for fall   8. Need for prophylactic vaccination with tetanus-diphtheria (TD)  - DT Vaccine greater than 7yo IM  9. Need for prophylactic vaccination against Streptococcus pneumoniae (pneumococcus)  - Pneumococcal conjugate vaccine 13-valent  10. Depression screen   11. MGUS (monoclonal gammopathy of unknown significance) (Oct 2014)  - Overdue  recommended 6 mo f/u w/Dr Mohamed  12. Osteoporosis  - Vit D  25 hydroxy (rtn osteoporosis monitoring)  - Encouraged f/u with Dr C. Gherghe for recommended treatment  13. Vertebral compression fracture, sequela & Chronic Lumbago   14. Herniated lumbar intervertebral disc, hx   15. Routine general medical examination at a health care facility   16. Medication management  - Urine Microscopic - CBC with Differential - BASIC METABOLIC PANEL WITH GFR - Hepatic function panel - Magnesium - Lipid panel  Plan:   During the course of the visit the patient was educated and counseled about appropriate screening and preventive services including:    Pneumococcal vaccine   Influenza vaccine  Td vaccine  Screening electrocardiogram  Bone densitometry screening  Colorectal cancer screening  Diabetes screening  Glaucoma screening  Nutrition counseling   Advanced directives: requested  Screening recommendations, referrals: Vaccinations: DT vaccine  05/06/2014 Influenza vaccine HD 01/15/2014 Pneumococcal vaccine 02/09/2011 Prevnar vaccine 05/06/2014 Shingles vaccine undecided Hep B vaccine not indicated  Nutrition assessed and recommended  Colonoscopy 04/07/2011 Dr [name] - recc 5 yr f/u Recommended yearly ophthalmology/optometry visit for glaucoma screening and checkup Recommended yearly dental visit for hygiene and checkup Advanced directives - yes  Conditions/risks identified: BMI: Discussed weight loss, diet, and increase physical activity.  Increase physical activity: AHA recommends 150 minutes of physical activity a week.  Medications reviewed PreDiabetes is not at goal, ACE/ARB therapy: No, Reason not on Ace Inhibitor/ARB therapy:  ACEi Allergic & is on an ARB (Losartin) Urinary Incontinence is not an issue: discussed non pharmacology and pharmacology options.  Fall risk: low- Patient does use a cane for balance & gait stability due to her back pain and sciatica being followed by Dr Sharlene Motts PT, home fall assessment, medications.   Subjective:   [name] [name] is a 78 y.o. MWF who presents for Medicare Annual Wellness Visit and complete physical.  Date of last medicare wellness visit is unknown.  She has had elevated blood pressure since 1986. Her blood pressure has not been controlled at home, & today their BP is BP: (!) 142/74 mmHg . In 2003 she had a PTCA & Stent placed & in Dec 2014 she had a low risk lexiscan Myoview. She continues regular cardiology f/u.  She does not workout due to painful gait from LBP & Rt sciatica. She denies chest pain, shortness of breath, dizziness.   Patient has had log hx/o GERD sx's and in 05/2011 she had a lap. Nissen fundoplication & in 63/8937 repair of a large paraesophageal hernia.  Patient has hx/o COPD & Asthma and has been followed by Dr Melvyn Novas & Elsworth Soho in the past.  Recently she has been stable w/o recent flares of asthma or respiratory infections.  Patient has hx/ DDD, HNP,   osteoporosis and vertebral compression fx's and is followed by Dr Louanne Skye and has been advised therapy by Dr Wardell Heath. In Oct 2014 , patient was refered to Dr Julien Nordmann with w/u of MGUS with negative BM bx and equivocal serum immunoglobulin studies and was recommended a 6 mo f/u by Dr Julien Nordmann which patient apparently failed to keep.  She is on cholesterol medication and denies myalgias. Her cholesterol is at goal. The cholesterol last visit was:  Lab Results  Component Value Date   CHOL 109 11/14/2013   HDL 57 11/14/2013   LDLCALC 42 11/14/2013   TRIG 49 11/14/2013   CHOLHDL 1.9 11/14/2013    She has had prediabetes for 5 years (august  2010 with A1c 5.8%). She has not been working on diet and exercise and denies hyperglycemia, hypoglycemia , paresthesia of the feet, polydipsia, polyuria, visual disturbances and vomiting. Last A1C in the office was:  Lab Results  Component Value Date   HGBA1C 5.9* 12/03/2013   Patient is on Vitamin D supplement.  (Vit D was 25 in 2008) Lab Results  Component Value Date   VD25OH 76 03/07/2014      Names of Other Physician/Practitioners you currently use: 1. [facility] here for primary care 2. Dr Einar Gip, OD, eye doctor, last visit Apr 2015 3. No dentist - has full dentures  Patient Care Team: [name] Pinto, MD as PCP - General (Internal Medicine) [name] Castle, MD as Consulting Physician (Gastroenterology) [name] Noel, MD as Consulting Physician (Pulmonary Disease) [name] Dresser, MD as Consulting Physician (Cardiology) [name] Boston, MD as Consulting Physician (General Surgery) [name] Oto, MD as Consulting Physician (Orthopedic Surgery) [name] Kingdom, MD as Consulting Physician (Endocrinology)  Medication Review: Medication Sig  . acetaminophen (TYLENOL) 500 MG tablet Take 500 mg by mouth every 6 (six) hours as needed for moderate pain.  . Aclidinium Bromide (TUDORZA PRESSAIR) 400 MCG/ACT AEPB  Inhale 1 puff into the lungs 2 (two) times daily as needed (wheezing).   [name] albuterol (PROVENTIL HFA;VENTOLIN HFA) 108 (90 BASE) MCG/ACT inhaler Inhale 1-2 puffs into the lungs every 6 (six) hours as needed for wheezing or shortness of breath.  . ALPRAZolam (XANAX) 0.5 MG tablet Take 1 tablet (0.5 mg total) by mouth 3 (three) times daily as needed for sleep or anxiety.  [name] amLODipine (NORVASC) 10 MG tablet Take 5 mg by mouth at bedtime. For blood pressure  . Cholecalciferol (VITAMIN D3) 2000 UNITS TABS Take 6,000 Units by mouth daily.   . diclofenac (VOLTAREN) 75 MG EC tablet Take 75 mg by mouth 2 (two) times daily.  . feeding supplement, GLUCERNA SHAKE, (GLUCERNA SHAKE) LIQD Take 237 mLs by mouth 3 (three) times daily between meals.  . gabapentin (NEURONTIN) 300 MG capsule Take 300 mg by mouth 3 (three) times daily.  [name] HYDROcodone-acetaminophen (NORCO/VICODIN) 5-325 MG per tablet Take 0.5-1 tablet every 6 hours as needed for severe pain. Use only under supervision.  [name] ipratropium-albuterol (DUONEB) 0.5-2.5 (3) MG/3ML SOLN USE ONE VIAL IN NEBULIZER 4 TIMES DAILY (Patient taking differently: Inhale 3 mLs into the lungs every 6 (six) hours as needed. )  . Linaclotide (LINZESS) 145 MCG CAPS capsule Take 1 capsule (145 mcg total) by mouth daily.  [name] losartan (  COZAAR) 100 MG tablet Take 100 mg by mouth daily.  . metoCLOPramide (REGLAN) 5 MG tablet Take 1/2 to 1 tablet 3 x day before meals for Nausea  . metoprolol (LOPRESSOR) 50 MG tablet TAKE ONE TABLET BY MOUTH TWICE DAILY FOR BLOOD PRESSURE  . metoprolol (LOPRESSOR) 50 MG tablet Take 25 mg by mouth at bedtime.  . mometasone-formoterol (DULERA) 200-5 MCG/ACT AERO Inhale 2 puffs into the lungs 2 (two) times daily.  . ondansetron (ZOFRAN ODT) 4 MG disintegrating tablet Take 1 tablet (4 mg total) by mouth every 8 (eight) hours as needed for nausea or vomiting.  [name] OVER THE COUNTER MEDICATION Take 1,000 mg by mouth 2 (two) times daily. Coral Calcium 1000mg   .  OVER THE COUNTER MEDICATION Take 400 mg by mouth 2 (two) times daily. Magnesium Carbonate 400mg   . polyethylene glycol (MIRALAX / GLYCOLAX) packet Take 17 g by mouth 2 (two) times daily.  . prochlorperazine (COMPAZINE) 10 MG tablet TAKE 1 TABLET 3 TIMES A DAY BEFORE MEALS  . senna (SENOKOT) 8.6 MG TABS tablet Take 1 tablet by mouth 2 (two) times daily.    Current Problems (verified) Patient Active Problem List   Diagnosis Date Noted  . Abdominal pain, left lower quadrant 03/27/2014  . Osteoporosis 02/10/2014  . Protein-calorie malnutrition, severe 12/05/2013  . Abnormal transaminases 12/03/2013  . Transaminitis 12/03/2013  . Nausea alone 10/28/2013  . Prediabetes 08/14/2013  . Vitamin D deficiency 08/14/2013  . Medication management 08/14/2013  . Dysphagia 05/15/2013  . Constipation, chronic 04/03/2013  . Anxiety   . MGUS (monoclonal gammopathy of unknown significance) 03/05/2013  . Herniated lumbar intervertebral disc 06/25/2012    Class: Acute  . Vertebral compression fracture 06/23/2012  . GASTRITIS 05/13/2010  . ASCAD/CABG 08/28/2008  . ESOPHAGEAL STRICTURE 08/28/2008  . ASTHMA 07/29/2008  . Incarcerated recurrent paraesophageal hiatal hernia s/p lap redo repair [id number] 01/31/2008  . Essential hypertension 07/25/2007  . Hyperlipidemia 07/24/2007    Screening Tests Health Maintenance  Topic Date Due  . ZOSTAVAX  11/01/1997  . TETANUS/TDAP  05/09/2013  . INFLUENZA VACCINE  12/08/2014  . COLONOSCOPY  04/06/2016  . DEXA SCAN  Completed  . PNEUMOCOCCAL POLYSACCHARIDE VACCINE AGE 43 AND OVER  Completed    Immunization History  Administered Date(s) Administered  . DT 05/06/2014  . Influenza Split 02/07/2011  . Influenza Whole 02/09/2009, 01/19/2010, 02/13/2012  . Influenza, High Dose Seasonal PF 03/22/2013, 01/15/2014  . Pneumococcal Conjugate-13 05/06/2014  . Pneumococcal Polysaccharide-23 02/09/2011  . Pneumococcal-Unspecified 02/06/2010, 02/09/2012  . Td  05/10/2003    Preventative care: Last colonoscopy: 2012 as above  Prior vaccinations: TD : 05/06/2014  Influenza: HD 01/15/2014  Pneumococcal: 02/09/2012 Prevnar: 05/06/2014 Shingles/Zostavax: undecided  History reviewed: allergies, current medications, past family history, past medical history, past social history, past surgical history and problem list   Risk Factors: Tobacco History  Substance Use Topics  . Smoking status: Never Smoker   . Smokeless tobacco: Never Used  . Alcohol Use: No   She does not smoke.  Patient is not a former smoker. Are there smokers in your home (other than you)?  No  Alcohol Current alcohol use: none  Caffeine Current caffeine use: coffee 1 cup /day  Exercise Current exercise: none  Nutrition/Diet Current diet: in general, a "healthy" diet    Cardiac risk factors: advanced age (older than 66 for men, 42 for women), prediabetic, dyslipidemia, hypertension and sedentary lifestyle.  Depression Screen (Note: if answer to either of the following is "Yes", a  more complete depression screening is indicated)   Q1: Over the past two weeks, have you felt down, depressed or hopeless? Yes, due to chronic pain and limited functioning  Q2: Over the past two weeks, have you felt little interest or pleasure in doing things? yes  Have you lost interest or pleasure in daily life? No  Do you often feel hopeless? No  Do you cry easily over simple problems? No  Activities of Daily Living In your present state of health, do you have any difficulty performing the following activities?:  Driving? No Managing money?  No Feeding yourself? No Getting from bed to chair? No Climbing a flight of stairs? No Preparing food and eating?: No Bathing or showering? No Getting dressed: No Getting to the toilet? No Using the toilet:No Moving around from place to place: No In the past year have you fallen or had a near fall?:No   Are you sexually active?  No  Do  you have more than one partner?  No  Vision Difficulties: No  Hearing Difficulties: No Do you often ask people to speak up or repeat themselves? No Do you experience ringing or noises in your ears? No Do you have difficulty understanding soft or whispered voices? Sometimes.  Cognition  Do you feel that you have a problem with memory?No  Do you often misplace items? No  Do you feel safe at home?  Yes  Advanced directives Does patient have a [place]? Yes Does patient have a Living Will? Yes  In addition to the HPI above,  No Fever-chills,  No Headache, No changes with Vision or hearing,  No problems swallowing food or Liquids,  No Chest pain or productive Cough or Shortness of Breath,  No Abdominal pain, occas Nausea , no  Vomitting, Bowel movements are occas constipated,  No Blood in stool or Urine,  No dysuria,  No new skin rashes or bruises,  Has had weight loss over the last year,  No polyuria, polydypsia or polyphagia,  No significant Mental Stressors.  A full 10 point Review of Systems was done, except as stated above, all other Review of Systems were negative    Objective:     BP 142/74  Pulse 68  Temp 98.1 F   Resp 18  Ht 5' 3.75"   Wt 134 lb     BMI 23.19   General appearance: alert, appears uncomfortable & chronically ill  female Cognitive Testing  Alert? Yes  Normal Appearance?Yes   Oriented to person? Yes  Place? Yes   Time? Yes  Recall of three objects?  Yes  Can perform simple calculations? Yes  Displays appropriate judgment? Yes  Can read the correct time from a watch/clock?Yes  HEENT: normocephalic, sclerae anicteric, TMs pearly, nares patent, no discharge or erythema, pharynx normal Oral cavity: MMM, no lesions Neck: supple, no lymphadenopathy, no thyromegaly, no masses Heart: RRR, normal S1, S2, no murmurs Lungs: CTA bilaterally, no wheezes, rhonchi, or rales Breast - No abnormal masses Abdomen: +bs, soft, non tender,  non distended, no masses, no hepatomegaly, no splenomegaly Musculoskeletal: nontender, no swelling, no obvious deformity Extremities: no edema, no cyanosis, no clubbing Pulses: 2+ symmetric, upper and lower extremities, normal cap refill Neurological: alert, oriented x 3, CN2-12 intact, strength normal upper extremities and lower extremities, sensation normal throughout, DTRs 2+ throughout, no cerebellar signs, gait slightly broad-based and limping stabilized with a cane Psychiatric: normal affect, behavior normal, pleasant   Medicare Attestation I have personally  reviewed: The patient's medical and social history Their use of alcohol, tobacco or illicit drugs Their current medications and supplements The patient's functional ability including ADLs,fall risks, home safety risks, cognitive, and hearing and visual impairment Diet and physical activities Evidence for depression or mood disorders  The patient's weight, height, BMI, and visual acuity have been recorded in the chart.  I have made referrals, counseling, and provided education to the patient based on review of the above and I have provided the patient with a written personalized care plan for preventive services.    Narely Nobles DAVID, MD   05/06/2014

## 2014-05-07 LAB — HEPATIC FUNCTION PANEL
ALBUMIN: 4.5 g/dL (ref 3.5–5.2)
ALT: 25 U/L (ref 0–35)
AST: 17 U/L (ref 0–37)
Alkaline Phosphatase: 69 U/L (ref 39–117)
Bilirubin, Direct: 0.1 mg/dL (ref 0.0–0.3)
Indirect Bilirubin: 0.6 mg/dL (ref 0.2–1.2)
TOTAL PROTEIN: 7 g/dL (ref 6.0–8.3)
Total Bilirubin: 0.7 mg/dL (ref 0.2–1.2)

## 2014-05-07 LAB — URINALYSIS, ROUTINE W REFLEX MICROSCOPIC
Bilirubin Urine: NEGATIVE
Glucose, UA: NEGATIVE mg/dL
HGB URINE DIPSTICK: NEGATIVE
Ketones, ur: NEGATIVE mg/dL
LEUKOCYTES UA: NEGATIVE
NITRITE: NEGATIVE
PH: 7.5 (ref 5.0–8.0)
Protein, ur: NEGATIVE mg/dL
SPECIFIC GRAVITY, URINE: 1.01 (ref 1.005–1.030)
UROBILINOGEN UA: 0.2 mg/dL (ref 0.0–1.0)

## 2014-05-07 LAB — BASIC METABOLIC PANEL WITH GFR
BUN: 16 mg/dL (ref 6–23)
CHLORIDE: 105 meq/L (ref 96–112)
CO2: 27 mEq/L (ref 19–32)
Calcium: 10.2 mg/dL (ref 8.4–10.5)
Creat: 0.95 mg/dL (ref 0.50–1.10)
GFR, EST NON AFRICAN AMERICAN: 58 mL/min — AB
GFR, Est African American: 67 mL/min
Glucose, Bld: 94 mg/dL (ref 70–99)
POTASSIUM: 3.8 meq/L (ref 3.5–5.3)
Sodium: 142 mEq/L (ref 135–145)

## 2014-05-07 LAB — VITAMIN D 25 HYDROXY (VIT D DEFICIENCY, FRACTURES): VIT D 25 HYDROXY: 74 ng/mL (ref 30–100)

## 2014-05-07 LAB — LIPID PANEL
CHOL/HDL RATIO: 2.8 ratio
Cholesterol: 169 mg/dL (ref 0–200)
HDL: 60 mg/dL (ref >39)
LDL Cholesterol: 84 mg/dL (ref 0–99)
TRIGLYCERIDES: 124 mg/dL (ref <150)
VLDL: 25 mg/dL (ref 0–40)

## 2014-05-07 LAB — MICROALBUMIN / CREATININE URINE RATIO
CREATININE, URINE: 106.9 mg/dL
MICROALB UR: 0.8 mg/dL (ref <2.0)
Microalb Creat Ratio: 7.5 mg/g (ref 0.0–30.0)

## 2014-05-07 LAB — INSULIN, FASTING: Insulin fasting, serum: 24.5 u[IU]/mL — ABNORMAL HIGH (ref 2.0–19.6)

## 2014-05-07 LAB — TSH: TSH: 1.904 u[IU]/mL (ref 0.350–4.500)

## 2014-05-07 LAB — MAGNESIUM: MAGNESIUM: 1.9 mg/dL (ref 1.5–2.5)

## 2014-05-08 ENCOUNTER — Other Ambulatory Visit: Payer: Self-pay | Admitting: *Deleted

## 2014-05-08 DIAGNOSIS — D472 Monoclonal gammopathy: Secondary | ICD-10-CM

## 2014-05-08 NOTE — Progress Notes (Signed)
Per Dr Vista Mink, pt needs f/u in 2-3 weeks. Received referral from Dr. Eddie North.  Onc tx schedule filled out

## 2014-05-12 ENCOUNTER — Telehealth: Payer: Self-pay | Admitting: Internal Medicine

## 2014-05-12 ENCOUNTER — Other Ambulatory Visit: Payer: Self-pay | Admitting: Specialist

## 2014-05-12 DIAGNOSIS — M79606 Pain in leg, unspecified: Secondary | ICD-10-CM | POA: Diagnosis not present

## 2014-05-12 DIAGNOSIS — Z79899 Other long term (current) drug therapy: Secondary | ICD-10-CM | POA: Diagnosis not present

## 2014-05-12 DIAGNOSIS — G894 Chronic pain syndrome: Secondary | ICD-10-CM | POA: Diagnosis not present

## 2014-05-12 DIAGNOSIS — M545 Low back pain: Secondary | ICD-10-CM

## 2014-05-12 DIAGNOSIS — M4307 Spondylolysis, lumbosacral region: Secondary | ICD-10-CM | POA: Diagnosis not present

## 2014-05-12 NOTE — Telephone Encounter (Signed)
phone sounded like fax....mailed pt appt sched and letter

## 2014-05-18 ENCOUNTER — Ambulatory Visit
Admission: RE | Admit: 2014-05-18 | Discharge: 2014-05-18 | Disposition: A | Discharge status: Home / Self Care | Payer: Medicare Other | Source: Ambulatory Visit | Attending: Specialist | Admitting: Specialist

## 2014-05-18 DIAGNOSIS — M47817 Spondylosis without myelopathy or radiculopathy, lumbosacral region: Secondary | ICD-10-CM | POA: Diagnosis not present

## 2014-05-18 DIAGNOSIS — S32020A Wedge compression fracture of second lumbar vertebra, initial encounter for closed fracture: Secondary | ICD-10-CM | POA: Diagnosis not present

## 2014-05-18 DIAGNOSIS — M4806 Spinal stenosis, lumbar region: Secondary | ICD-10-CM | POA: Diagnosis not present

## 2014-05-18 DIAGNOSIS — M5136 Other intervertebral disc degeneration, lumbar region: Secondary | ICD-10-CM | POA: Diagnosis not present

## 2014-05-18 DIAGNOSIS — M545 Low back pain: Secondary | ICD-10-CM

## 2014-05-18 MED ORDER — GADOBENATE DIMEGLUMINE 529 MG/ML IV SOLN
13.0000 mL | Freq: Once | INTRAVENOUS | Status: AC | PRN
  Administered 2014-05-18: 13 mL via INTRAVENOUS

## 2014-05-20 DIAGNOSIS — S32020A Wedge compression fracture of second lumbar vertebra, initial encounter for closed fracture: Secondary | ICD-10-CM | POA: Diagnosis not present

## 2014-05-20 DIAGNOSIS — M47817 Spondylosis without myelopathy or radiculopathy, lumbosacral region: Secondary | ICD-10-CM | POA: Diagnosis not present

## 2014-05-26 DIAGNOSIS — M545 Low back pain: Secondary | ICD-10-CM | POA: Diagnosis not present

## 2014-05-26 DIAGNOSIS — G894 Chronic pain syndrome: Secondary | ICD-10-CM | POA: Diagnosis not present

## 2014-05-26 DIAGNOSIS — M47817 Spondylosis without myelopathy or radiculopathy, lumbosacral region: Secondary | ICD-10-CM | POA: Diagnosis not present

## 2014-05-26 DIAGNOSIS — M79606 Pain in leg, unspecified: Secondary | ICD-10-CM | POA: Diagnosis not present

## 2014-05-29 DIAGNOSIS — M545 Low back pain: Secondary | ICD-10-CM | POA: Diagnosis not present

## 2014-05-30 ENCOUNTER — Telehealth: Payer: Self-pay | Admitting: Internal Medicine

## 2014-05-30 ENCOUNTER — Other Ambulatory Visit (HOSPITAL_COMMUNITY): Payer: Self-pay | Admitting: Interventional Radiology

## 2014-05-30 DIAGNOSIS — M549 Dorsalgia, unspecified: Secondary | ICD-10-CM

## 2014-05-30 DIAGNOSIS — IMO0002 Reserved for concepts with insufficient information to code with codable children: Secondary | ICD-10-CM

## 2014-05-30 NOTE — Telephone Encounter (Signed)
pt called back to r/s cx appt...done...pt ok adna ware of new dt

## 2014-05-30 NOTE — Telephone Encounter (Signed)
pt called to cx appt due to another MD visit....she will call back to r/s

## 2014-06-02 ENCOUNTER — Other Ambulatory Visit: Payer: Medicare HMO

## 2014-06-02 ENCOUNTER — Ambulatory Visit: Payer: Medicare HMO | Admitting: Physician Assistant

## 2014-06-05 ENCOUNTER — Ambulatory Visit: Payer: Medicare HMO | Admitting: Internal Medicine

## 2014-06-05 DIAGNOSIS — Z79899 Other long term (current) drug therapy: Secondary | ICD-10-CM | POA: Diagnosis not present

## 2014-06-05 DIAGNOSIS — G894 Chronic pain syndrome: Secondary | ICD-10-CM | POA: Diagnosis not present

## 2014-06-05 DIAGNOSIS — M47817 Spondylosis without myelopathy or radiculopathy, lumbosacral region: Secondary | ICD-10-CM | POA: Diagnosis not present

## 2014-06-05 DIAGNOSIS — M79606 Pain in leg, unspecified: Secondary | ICD-10-CM | POA: Diagnosis not present

## 2014-06-05 DIAGNOSIS — G2581 Restless legs syndrome: Secondary | ICD-10-CM | POA: Diagnosis not present

## 2014-06-06 ENCOUNTER — Ambulatory Visit (HOSPITAL_COMMUNITY): Admission: RE | Admit: 2014-06-06 | Payer: Medicare Other | Source: Ambulatory Visit

## 2014-06-09 ENCOUNTER — Telehealth: Payer: Self-pay | Admitting: Internal Medicine

## 2014-06-09 NOTE — Telephone Encounter (Signed)
returned call and s.w pt husband to have wife call back to r/s appt

## 2014-06-09 NOTE — Telephone Encounter (Signed)
returned call and s.w pt she did not want to r/s at this time will call back to r/s

## 2014-06-11 ENCOUNTER — Other Ambulatory Visit: Payer: Medicare Other

## 2014-06-11 ENCOUNTER — Ambulatory Visit: Payer: Self-pay | Admitting: Physician Assistant

## 2014-06-13 ENCOUNTER — Ambulatory Visit (HOSPITAL_COMMUNITY): Admission: RE | Admit: 2014-06-13 | Payer: Medicare Other | Source: Ambulatory Visit

## 2014-06-13 ENCOUNTER — Ambulatory Visit: Payer: Medicare HMO | Admitting: Internal Medicine

## 2014-06-17 ENCOUNTER — Ambulatory Visit (HOSPITAL_COMMUNITY)
Admission: RE | Admit: 2014-06-17 | Discharge: 2014-06-17 | Disposition: A | Discharge status: Home / Self Care | Payer: Medicare Other | Source: Ambulatory Visit | Attending: Interventional Radiology | Admitting: Interventional Radiology

## 2014-06-17 ENCOUNTER — Other Ambulatory Visit (HOSPITAL_COMMUNITY): Payer: Self-pay | Admitting: Interventional Radiology

## 2014-06-17 DIAGNOSIS — S32020A Wedge compression fracture of second lumbar vertebra, initial encounter for closed fracture: Secondary | ICD-10-CM | POA: Diagnosis not present

## 2014-06-17 DIAGNOSIS — M549 Dorsalgia, unspecified: Secondary | ICD-10-CM

## 2014-06-17 DIAGNOSIS — IMO0002 Reserved for concepts with insufficient information to code with codable children: Secondary | ICD-10-CM

## 2014-06-18 ENCOUNTER — Other Ambulatory Visit: Payer: Self-pay | Admitting: Radiology

## 2014-06-19 ENCOUNTER — Ambulatory Visit (HOSPITAL_COMMUNITY)
Admission: RE | Admit: 2014-06-19 | Discharge: 2014-06-19 | Disposition: A | Discharge status: Home / Self Care | Payer: Medicare Other | Source: Ambulatory Visit | Attending: Interventional Radiology | Admitting: Interventional Radiology

## 2014-06-19 ENCOUNTER — Encounter (HOSPITAL_COMMUNITY): Payer: Self-pay

## 2014-06-19 DIAGNOSIS — S32020A Wedge compression fracture of second lumbar vertebra, initial encounter for closed fracture: Secondary | ICD-10-CM

## 2014-06-19 DIAGNOSIS — M4856XA Collapsed vertebra, not elsewhere classified, lumbar region, initial encounter for fracture: Secondary | ICD-10-CM | POA: Insufficient documentation

## 2014-06-19 LAB — CBC WITH DIFFERENTIAL/PLATELET
Basophils Absolute: 0 10*3/uL (ref 0.0–0.1)
Basophils Relative: 0 % (ref 0–1)
EOS ABS: 0.1 10*3/uL (ref 0.0–0.7)
EOS PCT: 2 % (ref 0–5)
HEMATOCRIT: 39.4 % (ref 36.0–46.0)
Hemoglobin: 13.2 g/dL (ref 12.0–15.0)
LYMPHS ABS: 1.4 10*3/uL (ref 0.7–4.0)
LYMPHS PCT: 25 % (ref 12–46)
MCH: 31.9 pg (ref 26.0–34.0)
MCHC: 33.5 g/dL (ref 30.0–36.0)
MCV: 95.2 fL (ref 78.0–100.0)
Monocytes Absolute: 0.5 10*3/uL (ref 0.1–1.0)
Monocytes Relative: 9 % (ref 3–12)
Neutro Abs: 3.6 10*3/uL (ref 1.7–7.7)
Neutrophils Relative %: 64 % (ref 43–77)
Platelets: 315 10*3/uL (ref 150–400)
RBC: 4.14 MIL/uL (ref 3.87–5.11)
RDW: 13.7 % (ref 11.5–15.5)
WBC: 5.7 10*3/uL (ref 4.0–10.5)

## 2014-06-19 LAB — BASIC METABOLIC PANEL
ANION GAP: 9 (ref 5–15)
BUN: 19 mg/dL (ref 6–23)
CHLORIDE: 107 mmol/L (ref 96–112)
CO2: 23 mmol/L (ref 19–32)
Calcium: 9 mg/dL (ref 8.4–10.5)
Creatinine, Ser: 0.97 mg/dL (ref 0.50–1.10)
GFR calc non Af Amer: 55 mL/min — ABNORMAL LOW (ref >90)
GFR, EST AFRICAN AMERICAN: 64 mL/min — AB (ref >90)
Glucose, Bld: 101 mg/dL — ABNORMAL HIGH (ref 70–99)
Potassium: 4.3 mmol/L (ref 3.5–5.1)
SODIUM: 139 mmol/L (ref 135–145)

## 2014-06-19 LAB — PROTIME-INR
INR: 0.98 (ref 0.00–1.49)
Prothrombin Time: 13 seconds (ref 11.6–15.2)

## 2014-06-19 MED ORDER — FENTANYL CITRATE 0.05 MG/ML IJ SOLN
INTRAMUSCULAR | Status: AC | PRN
  Administered 2014-06-19 (×4): 25 ug via INTRAVENOUS

## 2014-06-19 MED ORDER — SODIUM CHLORIDE 0.9 % IV SOLN
INTRAVENOUS | Status: DC
  Administered 2014-06-19: 08:00:00 via INTRAVENOUS

## 2014-06-19 MED ORDER — BUPIVACAINE HCL (PF) 0.25 % IJ SOLN
INTRAMUSCULAR | Status: AC
  Filled 2014-06-19: qty 30

## 2014-06-19 MED ORDER — IOHEXOL 300 MG/ML  SOLN
50.0000 mL | Freq: Once | INTRAMUSCULAR | Status: AC | PRN
  Administered 2014-06-19: 1 mL via INTRAVENOUS

## 2014-06-19 MED ORDER — CEFAZOLIN SODIUM-DEXTROSE 2-3 GM-% IV SOLR
2.0000 g | Freq: Once | INTRAVENOUS | Status: AC
  Administered 2014-06-19: 2 g via INTRAVENOUS

## 2014-06-19 MED ORDER — HYDROMORPHONE HCL 1 MG/ML IJ SOLN
INTRAMUSCULAR | Status: AC
  Filled 2014-06-19: qty 1

## 2014-06-19 MED ORDER — OXYCODONE HCL 5 MG PO TABS
5.0000 mg | ORAL_TABLET | Freq: Once | ORAL | Status: AC
Start: 2014-06-19 — End: 2014-06-19
  Administered 2014-06-19: 5 mg via ORAL

## 2014-06-19 MED ORDER — TOBRAMYCIN SULFATE 1.2 G IJ SOLR
INTRAMUSCULAR | Status: AC
  Filled 2014-06-19: qty 1.2

## 2014-06-19 MED ORDER — CEFAZOLIN SODIUM-DEXTROSE 2-3 GM-% IV SOLR
INTRAVENOUS | Status: AC
  Filled 2014-06-19: qty 50

## 2014-06-19 MED ORDER — OXYCODONE HCL 5 MG PO TABS
ORAL_TABLET | ORAL | Status: DC
Start: 2014-06-19 — End: 2014-06-20
  Filled 2014-06-19: qty 1

## 2014-06-19 MED ORDER — FENTANYL CITRATE 0.05 MG/ML IJ SOLN
INTRAMUSCULAR | Status: AC
  Filled 2014-06-19: qty 6

## 2014-06-19 MED ORDER — SODIUM CHLORIDE 0.9 % IV SOLN: INTRAVENOUS | Status: AC

## 2014-06-19 MED ORDER — MIDAZOLAM HCL 2 MG/2ML IJ SOLN
INTRAMUSCULAR | Status: AC
  Filled 2014-06-19: qty 6

## 2014-06-19 MED ORDER — MIDAZOLAM HCL 2 MG/2ML IJ SOLN
INTRAMUSCULAR | Status: AC | PRN
  Administered 2014-06-19 (×4): 1 mg via INTRAVENOUS

## 2014-06-19 NOTE — Procedures (Signed)
S/P L2 KP with biopsy

## 2014-06-19 NOTE — Discharge Instructions (Signed)
1.No stooping,bending or lifting more than 10 lbs for 2 weeks. °2.Use walker to ambulate for 2 weeks. °3.RTC in 2 weeks ° °KYPHOPLASTY/VERTEBROPLASTY DISCHARGE INSTRUCTIONS ° °Medications: (check all that apply) ° °   Resume all home medications as before procedure. °   °   Resume your (aspirin/Plavix/Coumadin) on                °  °Continue your pain medications as prescribed as needed.  Over the next 3-5 days, decrease your pain medication as tolerated.  Over the counter medications (i.e. Tylenol, ibuprofen, and aleve) may be substituted once severe/moderate pain symptoms have subsided. ° ° Wound Care: °- Bandages may be removed the day following your procedure.  You may get your incision wet once bandages are removed.  Bandaids may be used to cover the incisions until scab formation.  Topical ointments are optional. ° °- If you develop a fever greater than 101 degrees, have increased skin redness at the incision sites or pus-like oozing from incisions occurring within 1 week of the procedure, contact radiology at [phone] or [phone]. ° °- Ice pack to back for 15-20 minutes 2-3 time per day for first 2-3 days post procedure.  The ice will expedite muscle healing and help with the pain from the incisions. ° ° Activity: °- Bedrest today with limited activity for 24 hours post procedure. ° °- No driving for 48 hours. ° °- Increase your activity as tolerated after bedrest (with assistance if necessary). ° °- Refrain from any strenuous activity or heavy lifting (greater than 10 lbs.). ° ° Follow up: °- Contact radiology at [phone] or [phone] if any questions/concerns. ° °- A physician assistant from radiology will contact you in approximately 1 week. ° °- If a biopsy was performed at the time of your procedure, your referring physician should receive the results in usually 2-3 days. ° ° ° ° ° ° ° ° °

## 2014-06-19 NOTE — H&P (Signed)
Chief Complaint: "I'm here for a back fracture"  HPI: [name] [name] is an 79 y.o. female with prior KP of L3 and L4 vertebral fractures is now scheduled for L2 VP/KP. Seen in consult by Dr. Estanislado Pandy, see full report. No new c/o this am, c/o back pain and some pain in right groin radiating down leg. Has been NPO this am No recent illness, fevers.   Past Medical History:  Past Medical History  Diagnosis Date  . CAD (coronary artery disease)     LHC 2003 with 40% pLAD, 30% mLAD, 100% D1 (moderate vessel), 100%  D2 (small vessel), 95% mid small OM2.  Pt had PTCA to D1;  D2 and OM2 small vessels and tx medically;  Last Myoview (2012): anterior infarct seen with scar, no ischemia. EF normal. Pt managed medically;  Lexiscan Myoview (12/14):  Low risk; ant scar with very mild peri-infarct ischemia; EF 55% with ant AK   . Recurrent aspiration bronchitis/pneumonia     PFTs 12/09 FVC 97%, FEV1 101%, ratio 77%, TLC 109%. No significant obstruction or restriction.   . Hypertension   . Hyperlipidemia   . Esophageal stricture   . Gastritis   . GERD (gastroesophageal reflux disease)   . Asthma   . Blood transfusion   . Cough   . Wheezing   . Weakness   . Trouble swallowing   . Abdominal pain   . Hiatal hernia   . Shortness of breath   . Anxiety   . TIA (transient ischemic attack)   . BRONCHITIS, ACUTE 07/25/2007         . DIVERTICULOSIS, COLON 08/28/2008    Qualifier: Diagnosis of  By: [name], [name], [name]    . IRON DEFICIENCY ANEMIA, HX OF 07/24/2007    Qualifier: Diagnosis of  By: Tilden Dome    . Chronic nausea   . Chronic back pain   . PONV (postoperative nausea and vomiting)     Past Surgical History:  Past Surgical History  Procedure Laterality Date  . Back surgery    . Bladder repair    . Hemorrhoid surgery    . Cholecystectomy      Patient unsure of date.  [name] Appendectomy      patient unsure of date  . Total abdominal hysterectomy  1975    partial  . Coronary angioplasty   11/16/2001  . Eye surgery  11/2000    bilateral cataracts with lens implant  . Hiatal hernia repair  06/03/2011    Procedure: LAPAROSCOPIC REPAIR OF HIATAL HERNIA;  Surgeon: Adin Hector, MD;  Location: [facility];  Service: General;  Laterality: N/A;  . Laparoscopic nissen fundoplication  4/56/2563    Procedure: LAPAROSCOPIC NISSEN FUNDOPLICATION;  Surgeon: Adin Hector, MD;  Location: [facility];  Service: General;  Laterality: N/A;  . Lumbar laminectomy/decompression microdiscectomy Right 06/26/2012    Procedure: LUMBAR LAMINECTOMY/DECOMPRESSION MICRODISCECTOMY;  Surgeon: Jessy Oto, MD;  Location: [facility];  Service: Orthopedics;  Laterality: Right;  RIGHT L4-5 MICRODISCECTOMY  . Esophagogastroduodenoscopy N/A 03/15/2013    Procedure: ESOPHAGOGASTRODUODENOSCOPY (EGD);  Surgeon: Jerene Bears, MD;  Location: [facility];  Service: Endoscopy;  Laterality: N/A;  . Esophageal manometry N/A 03/25/2013    Procedure: ESOPHAGEAL MANOMETRY (EM);  Surgeon: Inda Castle, MD;  Location: [facility];  Service: Endoscopy;  Laterality: N/A;  . Hiatal hernia repair N/A 04/24/2013    Procedure: LAPAROSCOPIC  REPAIR RECURRENT PARASOPHAGEAL HIATAL HERNIA WITH FUNDOPLICATION;  Surgeon: Adin Hector, MD;  Location: [facility];  Service: General;  Laterality: N/A;  . Insertion of mesh N/A 04/24/2013    Procedure: INSERTION OF MESH;  Surgeon: Adin Hector, MD;  Location: [facility];  Service: General;  Laterality: N/A;  . Laparoscopic lysis of adhesions N/A 04/24/2013    Procedure: LAPAROSCOPIC LYSIS OF ADHESIONS;  Surgeon: Adin Hector, MD;  Location: [facility];  Service: General;  Laterality: N/A;  . Fixation kyphoplasty lumbar spine  X 2    "L3-4"    Family History:  Family History  Problem Relation Age of Onset  . Cancer Mother     colon   . Colon cancer Mother 65  . Heart disease Father 57    heart attack    Social History:  reports that she has never smoked. She has never used smokeless tobacco.  She reports that she does not drink alcohol or use illicit drugs.  Allergies:  Allergies  Allergen Reactions  . Aspartame Diarrhea and Nausea And Vomiting  . Daliresp [Roflumilast] Nausea And Vomiting  . Levofloxacin Nausea And Vomiting  . Shellfish Allergy Other (See Comments)    Flushing  . Tramadol Other (See Comments)    Blurred vision   . Ace Inhibitors Cough    Per Dr Melvyn Novas pulmonology 2013  . Biaxin [Clarithromycin] Other (See Comments)    PATIENT CAN TOLERATE Z-PAK.  Is written on patient's paper chart.  . Erythromycin Other (See Comments)    PATIENT CAN TOLERATE Z-PAK.  It is written on patient's paper chart.  . Hydrocodone Nausea Only    Medications:   Medication List    ASK your doctor about these medications        acetaminophen 500 MG tablet  Commonly known as:  TYLENOL  Take 500 mg by mouth every 6 (six) hours as needed for moderate pain.     albuterol 108 (90 BASE) MCG/ACT inhaler  Commonly known as:  PROVENTIL HFA;VENTOLIN HFA  Inhale 1-2 puffs into the lungs every 6 (six) hours as needed for wheezing or shortness of breath.     ALPRAZolam 0.5 MG tablet  Commonly known as:  XANAX  Take 1 tablet (0.5 mg total) by mouth 3 (three) times daily as needed for sleep or anxiety.     amLODipine 10 MG tablet  Commonly known as:  NORVASC  Take 5 mg by mouth at bedtime. For blood pressure     diclofenac 75 MG EC tablet  Commonly known as:  VOLTAREN  Take 75 mg by mouth 2 (two) times daily.     feeding supplement (GLUCERNA SHAKE) Liqd  Take 237 mLs by mouth 3 (three) times daily between meals.     gabapentin 300 MG capsule  Commonly known as:  NEURONTIN  Take 300 mg by mouth 3 (three) times daily.     ipratropium-albuterol 0.5-2.5 (3) MG/3ML Soln  Commonly known as:  DUONEB  USE ONE VIAL IN NEBULIZER 4 TIMES DAILY     Linaclotide 145 MCG Caps capsule  Commonly known as:  LINZESS  Take 1 capsule (145 mcg total) by mouth daily.     losartan 100 MG tablet   Commonly known as:  COZAAR  Take 100 mg by mouth daily.     metoprolol 50 MG tablet  Commonly known as:  LOPRESSOR  Take 25 mg by mouth at bedtime.     metoprolol 50 MG tablet  Commonly known as:  LOPRESSOR  TAKE ONE TABLET BY MOUTH TWICE DAILY FOR BLOOD PRESSURE  mometasone-formoterol 200-5 MCG/ACT Aero  Commonly known as:  DULERA  Inhale 2 puffs into the lungs 2 (two) times daily.     ondansetron 4 MG disintegrating tablet  Commonly known as:  ZOFRAN ODT  Take 1 tablet (4 mg total) by mouth every 8 (eight) hours as needed for nausea or vomiting.     OVER THE COUNTER MEDICATION  Take 1,000 mg by mouth 2 (two) times daily. Coral Calcium 1000mg      OVER THE COUNTER MEDICATION  Take 400 mg by mouth 2 (two) times daily. Magnesium Carbonate 400mg      polyethylene glycol packet  Commonly known as:  MIRALAX / GLYCOLAX  Take 17 g by mouth 2 (two) times daily.     prochlorperazine 10 MG tablet  Commonly known as:  COMPAZINE  TAKE 1 TABLET 3 TIMES A DAY BEFORE MEALS     senna 8.6 MG Tabs tablet  Commonly known as:  SENOKOT  Take 1 tablet by mouth 2 (two) times daily.     TUDORZA PRESSAIR 400 MCG/ACT Aepb  Generic drug:  Aclidinium Bromide  Inhale 1 puff into the lungs 2 (two) times daily as needed (wheezing).     Vitamin D3 2000 UNITS Tabs  Take 6,000 Units by mouth daily.        Please HPI for pertinent positives, otherwise complete 10 system ROS negative.  Physical Exam: BP 122/58 mmHg  Pulse 60  Temp(Src) 97 F (36.1 C) (Oral)  Resp 18  Ht 5\' 5"  (1.651 m)  Wt 130 lb (58.968 kg)  BMI 21.63 kg/m2  SpO2 100% Body mass index is 21.63 kg/(m^2).   General Appearance:  Alert, cooperative, no distress, appears stated age  Head:  Normocephalic, without obvious abnormality, atraumatic  ENT: Unremarkable  Neck: Supple, symmetrical, trachea midline  Lungs:   Clear to auscultation bilaterally, no w/r/r, respirations unlabored without use of accessory muscles.   Chest Wall:  No tenderness or deformity  Heart:  Regular rate and rhythm, S1, S2 normal, no murmur, rub or gallop.  Neurologic: Normal affect, no gross deficits.  Labs: No results found for this or any previous visit (from the past 48 hour(s)).  Imaging: Ir Radiologist Eval & Mgmt  06/18/2014   EXAM: ESTABLISHED PATIENT OFFICE VISIT  CHIEF COMPLAINT: Severe new onset low back pain.  Current Pain Level: 1-10  HISTORY OF PRESENT ILLNESS: Patient is a 79 year old lady who presents with new onset of severe low back pain which apparently came on suddenly without any history of trauma or abnormal movement.  The pain started sometime in January. At the present time, the pain is a severe low back pain which catches her breath. The pain is described as an 8 out of 10 with mild relieve with pain medications that she is on.  It tends to ease off while sitting or laying down. However standing up and turning causes excruciating pain. There is occasional radiation of this pain into her right leg to just below the level of the right knee anteriorly.  She denies any electrical-like shooting pain emanating. She feels that her right groin remains sore and tender also since the onset of this low back pain.  She ambulates with a walker. Patient cannot sleep at night, on account of her pain despite the medication.  Her weight has been slightly decreased since the onset of her symptoms. Her appetite is also decreased.  She denies any autonomic dysfunction of her bowel or bladder activity.  She denies any symptoms of  chills, fever or rigors. She also denies any UTI symptoms of dysuria, frequency of micturition, polyuria or hematuria.  The remainder of her review of systems is essentially negative for pathologic symptomatology.  Her past medical history and past surgical history remain as previously in EPIC records unchanged.  MEDICATIONS: Tylenol. Aclidinium bromide inhaler as needed for wheezing. Albuterol inhaler as needed.  Xanax as needed for sleep or anxiety. Norvasc. Cholecalciferol. Diclofenac. Glucerna shake. Gabapentin. Ipratropium-albuterol inhaler nebulizer as needed. Cozaar. Reglan. Metoprolol. Dulera puffs. Zofran. Calcium and magnesium over the counter tablets. Miralax. Compazine. Senakot.  Allergies. Aspartame. Daliresp. Levofloxacin. Shellfish allergy. Tramadol, ace inhibitors. Biaxin, erythromycin and hydrocodone.  PHYSICAL EXAMINATION: The patient apparently in significant distress on account of the pain. Patient is moaning in pain. Restless.  Neurologically otherwise alert, awake, oriented to time, place, space. No lateralizing neurological, motor, sensory or cranial nerve difficulty is noted. Station and gait not tested. Severely tender in the lumbar region overlying the L2 vertebral body.  ASSESSMENT AND PLAN: The patient's recent MRI scan was discussed with her and her brother.  This reveals imaging findings and marrow characteristics compatible with a recent inferior endplate fracture at L2. Previously treated fractures at L3-L4 remain stable.  The option of vertebral body augmentation to alleviate pain and also to prevent further collapse was discussed in detail. The procedure, the benefits, the risks and alternatives were all reviewed.  The patient is familiar with the vertebral body augmentation having had 2 of these earlier.  The patient is wanting to proceed with the treatment of vertebral body augmentation, most likely a kyphoplasty. This will be scheduled at the earliest possible. The patient and family were asked to call should they have any kind of concerns or questions. In the meantime she was advised to continue using her walker and to avoid lifting stooping or bending.   Electronically Signed   By: Luanne Bras M.D.   On: 06/17/2014 15:45    Assessment/Plan L2 compression fracture For VP/KP today Reviewed procedure, risks, complications, use of sedation Labs pending Consent signed in  chart  Ascencion Dike PA-C 06/19/2014, 7:51 AM

## 2014-06-24 ENCOUNTER — Telehealth (INDEPENDENT_AMBULATORY_CARE_PROVIDER_SITE_OTHER): Payer: Medicare Other | Admitting: Physician Assistant

## 2014-06-24 DIAGNOSIS — J0100 Acute maxillary sinusitis, unspecified: Secondary | ICD-10-CM

## 2014-06-24 MED ORDER — AZITHROMYCIN 250 MG PO TABS: ORAL_TABLET | ORAL | Status: DC

## 2014-06-24 NOTE — Telephone Encounter (Signed)
EVISIT TELEPHONE Patient with complicated history, just had surgery called the office for a telephone visit complaining of possible sinusitis. Symptoms include purulent nasal discharge, sinus pressure, tooth pain and headache. Onset of symptoms was 4 days ago, and has been gradually worsening since that time. Treatment to date: none.  PLAN: URI- Discussed the diagnosis and treatment of sinusitis. Discussed the importance of avoiding unnecessary antibiotic therapy. Suggested symptomatic OTC remedies. Nasal saline spray for congestion. Zithromax per orders. Follow up as needed. Follow up in 7 days or as needed.

## 2014-06-30 ENCOUNTER — Other Ambulatory Visit (HOSPITAL_COMMUNITY): Payer: Self-pay | Admitting: Interventional Radiology

## 2014-06-30 DIAGNOSIS — M549 Dorsalgia, unspecified: Secondary | ICD-10-CM

## 2014-06-30 DIAGNOSIS — IMO0002 Reserved for concepts with insufficient information to code with codable children: Secondary | ICD-10-CM

## 2014-07-01 ENCOUNTER — Other Ambulatory Visit: Payer: Self-pay | Admitting: Internal Medicine

## 2014-07-03 ENCOUNTER — Ambulatory Visit (HOSPITAL_COMMUNITY)
Admission: RE | Admit: 2014-07-03 | Discharge: 2014-07-03 | Disposition: A | Discharge status: Home / Self Care | Payer: Medicare Other | Source: Ambulatory Visit | Attending: Interventional Radiology | Admitting: Interventional Radiology

## 2014-07-03 DIAGNOSIS — M549 Dorsalgia, unspecified: Secondary | ICD-10-CM

## 2014-07-03 DIAGNOSIS — M4856XA Collapsed vertebra, not elsewhere classified, lumbar region, initial encounter for fracture: Secondary | ICD-10-CM | POA: Diagnosis not present

## 2014-07-03 DIAGNOSIS — M545 Low back pain: Secondary | ICD-10-CM | POA: Diagnosis not present

## 2014-07-03 DIAGNOSIS — IMO0002 Reserved for concepts with insufficient information to code with codable children: Secondary | ICD-10-CM

## 2014-07-08 DIAGNOSIS — M545 Low back pain: Secondary | ICD-10-CM | POA: Diagnosis not present

## 2014-07-08 DIAGNOSIS — M47817 Spondylosis without myelopathy or radiculopathy, lumbosacral region: Secondary | ICD-10-CM | POA: Diagnosis not present

## 2014-07-08 DIAGNOSIS — Z79899 Other long term (current) drug therapy: Secondary | ICD-10-CM | POA: Diagnosis not present

## 2014-07-08 DIAGNOSIS — G894 Chronic pain syndrome: Secondary | ICD-10-CM | POA: Diagnosis not present

## 2014-07-25 DIAGNOSIS — M25551 Pain in right hip: Secondary | ICD-10-CM | POA: Diagnosis not present

## 2014-08-06 DIAGNOSIS — M47817 Spondylosis without myelopathy or radiculopathy, lumbosacral region: Secondary | ICD-10-CM | POA: Diagnosis not present

## 2014-08-06 DIAGNOSIS — M545 Low back pain: Secondary | ICD-10-CM | POA: Diagnosis not present

## 2014-08-06 DIAGNOSIS — G894 Chronic pain syndrome: Secondary | ICD-10-CM | POA: Diagnosis not present

## 2014-08-06 DIAGNOSIS — M79606 Pain in leg, unspecified: Secondary | ICD-10-CM | POA: Diagnosis not present

## 2014-08-06 DIAGNOSIS — Z79899 Other long term (current) drug therapy: Secondary | ICD-10-CM | POA: Diagnosis not present

## 2014-08-06 DIAGNOSIS — M4307 Spondylolysis, lumbosacral region: Secondary | ICD-10-CM | POA: Diagnosis not present

## 2014-08-06 DIAGNOSIS — Z79891 Long term (current) use of opiate analgesic: Secondary | ICD-10-CM | POA: Diagnosis not present

## 2014-08-06 DIAGNOSIS — M7060 Trochanteric bursitis, unspecified hip: Secondary | ICD-10-CM | POA: Diagnosis not present

## 2014-08-11 ENCOUNTER — Other Ambulatory Visit: Payer: Self-pay | Admitting: Internal Medicine

## 2014-08-13 ENCOUNTER — Other Ambulatory Visit: Payer: Self-pay | Admitting: Internal Medicine

## 2014-08-20 ENCOUNTER — Ambulatory Visit (INDEPENDENT_AMBULATORY_CARE_PROVIDER_SITE_OTHER): Payer: Medicare Other | Admitting: Internal Medicine

## 2014-08-20 ENCOUNTER — Ambulatory Visit: Payer: Self-pay | Admitting: Physician Assistant

## 2014-08-20 ENCOUNTER — Encounter: Payer: Self-pay | Admitting: Internal Medicine

## 2014-08-20 VITALS — BP 138/70 | HR 78 | Temp 98.2°F | Resp 16 | Ht 63.75 in | Wt 137.0 lb

## 2014-08-20 DIAGNOSIS — R7309 Other abnormal glucose: Secondary | ICD-10-CM

## 2014-08-20 DIAGNOSIS — E559 Vitamin D deficiency, unspecified: Secondary | ICD-10-CM

## 2014-08-20 DIAGNOSIS — R7303 Prediabetes: Secondary | ICD-10-CM

## 2014-08-20 DIAGNOSIS — M545 Low back pain: Secondary | ICD-10-CM | POA: Diagnosis not present

## 2014-08-20 DIAGNOSIS — M47817 Spondylosis without myelopathy or radiculopathy, lumbosacral region: Secondary | ICD-10-CM | POA: Diagnosis not present

## 2014-08-20 DIAGNOSIS — Z79899 Other long term (current) drug therapy: Secondary | ICD-10-CM | POA: Diagnosis not present

## 2014-08-20 DIAGNOSIS — M5137 Other intervertebral disc degeneration, lumbosacral region: Secondary | ICD-10-CM | POA: Diagnosis not present

## 2014-08-20 DIAGNOSIS — G894 Chronic pain syndrome: Secondary | ICD-10-CM | POA: Diagnosis not present

## 2014-08-20 DIAGNOSIS — E782 Mixed hyperlipidemia: Secondary | ICD-10-CM | POA: Diagnosis not present

## 2014-08-20 DIAGNOSIS — I10 Essential (primary) hypertension: Secondary | ICD-10-CM | POA: Diagnosis not present

## 2014-08-20 LAB — CBC WITH DIFFERENTIAL/PLATELET
BASOS ABS: 0 10*3/uL (ref 0.0–0.1)
Basophils Relative: 0 % (ref 0–1)
EOS ABS: 0.1 10*3/uL (ref 0.0–0.7)
Eosinophils Relative: 1 % (ref 0–5)
HEMATOCRIT: 39.3 % (ref 36.0–46.0)
Hemoglobin: 13.2 g/dL (ref 12.0–15.0)
LYMPHS PCT: 21 % (ref 12–46)
Lymphs Abs: 1.4 10*3/uL (ref 0.7–4.0)
MCH: 32.1 pg (ref 26.0–34.0)
MCHC: 33.6 g/dL (ref 30.0–36.0)
MCV: 95.6 fL (ref 78.0–100.0)
MPV: 10.3 fL (ref 8.6–12.4)
Monocytes Absolute: 0.7 10*3/uL (ref 0.1–1.0)
Monocytes Relative: 11 % (ref 3–12)
Neutro Abs: 4.5 10*3/uL (ref 1.7–7.7)
Neutrophils Relative %: 67 % (ref 43–77)
Platelets: 373 10*3/uL (ref 150–400)
RBC: 4.11 MIL/uL (ref 3.87–5.11)
RDW: 14.5 % (ref 11.5–15.5)
WBC: 6.7 10*3/uL (ref 4.0–10.5)

## 2014-08-20 NOTE — Progress Notes (Signed)
Patient ID: [name] [name], female   DOB: Aug 14, 1935, 79 y.o.   MRN: [id number]  Assessment and Plan:  Hypertension:  -Continue medication,  -monitor blood pressure at home.  -Continue DASH diet.   -Reminder to go to the ER if any CP, SOB, nausea, dizziness, severe HA, changes vision/speech, left arm numbness and tingling, and jaw pain.  Cholesterol: -Continue diet and exercise.  -Check cholesterol.   Pre-diabetes: -Continue diet and exercise.  -Check A1C  Vitamin D Def: -check level -continue medications.   Continue diet and meds as discussed. Further disposition pending results of labs.  HPI 79 y.o. female  presents for 3 month follow up with hypertension, hyperlipidemia, prediabetes and vitamin D.   Her blood pressure has been controlled at home, today their BP is BP: 138/70 mmHg.   She does not workout.  She cannot do a whole lot of exercise secondary to her back pain.   She denies chest pain, shortness of breath, dizziness.   She is on cholesterol medication and denies myalgias. Her cholesterol is at goal. The cholesterol last visit was:   Lab Results  Component Value Date   CHOL 169 05/06/2014   HDL 60 05/06/2014   LDLCALC 84 05/06/2014   TRIG 124 05/06/2014   CHOLHDL 2.8 05/06/2014     She has not been working on diet and exercise for prediabetes, and denies foot ulcerations, nausea, paresthesia of the feet, polydipsia, polyuria, visual disturbances and vomiting. Last A1C in the office was:  Lab Results  Component Value Date   HGBA1C 6.0* 05/06/2014    Patient is on Vitamin D supplement.  Lab Results  Component Value Date   VD25OH 74 05/06/2014      Current Medications:  Current Outpatient Prescriptions on File Prior to Visit  Medication Sig Dispense Refill  . acetaminophen (TYLENOL) 500 MG tablet Take 500 mg by mouth every 6 (six) hours as needed for moderate pain.    . Aclidinium Bromide (TUDORZA PRESSAIR) 400 MCG/ACT AEPB Inhale 1 puff into the lungs 2  (two) times daily as needed (wheezing).     [name] albuterol (PROVENTIL HFA;VENTOLIN HFA) 108 (90 BASE) MCG/ACT inhaler Inhale 1-2 puffs into the lungs every 6 (six) hours as needed for wheezing or shortness of breath.    . ALPRAZolam (XANAX) 0.5 MG tablet Take 1 tablet (0.5 mg total) by mouth 3 (three) times daily as needed for sleep or anxiety. 90 tablet 5  . amLODipine (NORVASC) 10 MG tablet Take 5 mg by mouth at bedtime. For blood pressure    . azithromycin (ZITHROMAX) 250 MG tablet 2 tablets by mouth today then one tablet daily for 4 days. 6 tablet 1  . Cholecalciferol (VITAMIN D3) 2000 UNITS TABS Take 6,000 Units by mouth daily.     . diclofenac (VOLTAREN) 75 MG EC tablet Take 75 mg by mouth 2 (two) times daily.    . feeding supplement, GLUCERNA SHAKE, (GLUCERNA SHAKE) LIQD Take 237 mLs by mouth 3 (three) times daily between meals. 90 Can 0  . gabapentin (NEURONTIN) 300 MG capsule Take 300 mg by mouth 3 (three) times daily.    [name] ipratropium-albuterol (DUONEB) 0.5-2.5 (3) MG/3ML SOLN USE ONE VIAL IN NEBULIZER 4 TIMES DAILY (Patient taking differently: Inhale 3 mLs into the lungs every 6 (six) hours as needed. ) 360 mL 99  . Linaclotide (LINZESS) 145 MCG CAPS capsule Take 1 capsule (145 mcg total) by mouth daily. 15 capsule 12  . losartan (COZAAR) 100 MG tablet Take 100  mg by mouth daily.    [name] losartan (COZAAR) 100 MG tablet TAKE ONE TABLET BY MOUTH ONCE DAILY (DISCONTINUE  BENICAR) 90 tablet 0  . metoprolol (LOPRESSOR) 50 MG tablet TAKE ONE TABLET BY MOUTH TWICE DAILY FOR BLOOD PRESSURE 180 tablet 1  . mometasone-formoterol (DULERA) 200-5 MCG/ACT AERO Inhale 2 puffs into the lungs 2 (two) times daily. 13 g 3  . ondansetron (ZOFRAN ODT) 4 MG disintegrating tablet Take 1 tablet (4 mg total) by mouth every 8 (eight) hours as needed for nausea or vomiting. 10 tablet 0  . OVER THE COUNTER MEDICATION Take 1,000 mg by mouth 2 (two) times daily. Coral Calcium 1000mg     . OVER THE COUNTER MEDICATION Take 400  mg by mouth 2 (two) times daily. Magnesium Carbonate 400mg     . polyethylene glycol (MIRALAX / GLYCOLAX) packet Take 17 g by mouth 2 (two) times daily.    . prochlorperazine (COMPAZINE) 10 MG tablet TAKE 1 TABLET 3 TIMES A DAY BEFORE MEALS 90 tablet 3  . prochlorperazine (COMPAZINE) 10 MG tablet TAKE 1 TABLET 3 TIMES A DAY BEFORE MEALS 90 tablet 3  . senna (SENOKOT) 8.6 MG TABS tablet Take 1 tablet by mouth 2 (two) times daily.      No current facility-administered medications on file prior to visit.    Medical History:  Past Medical History  Diagnosis Date  . CAD (coronary artery disease)     LHC 2003 with 40% pLAD, 30% mLAD, 100% D1 (moderate vessel), 100%  D2 (small vessel), 95% mid small OM2.  Pt had PTCA to D1;  D2 and OM2 small vessels and tx medically;  Last Myoview (2012): anterior infarct seen with scar, no ischemia. EF normal. Pt managed medically;  Lexiscan Myoview (12/14):  Low risk; ant scar with very mild peri-infarct ischemia; EF 55% with ant AK   . Recurrent aspiration bronchitis/pneumonia     PFTs 12/09 FVC 97%, FEV1 101%, ratio 77%, TLC 109%. No significant obstruction or restriction.   . Hypertension   . Hyperlipidemia   . Esophageal stricture   . Gastritis   . GERD (gastroesophageal reflux disease)   . Asthma   . Blood transfusion   . Cough   . Wheezing   . Weakness   . Trouble swallowing   . Abdominal pain   . Hiatal hernia   . Shortness of breath   . Anxiety   . TIA (transient ischemic attack)   . BRONCHITIS, ACUTE 07/25/2007         . DIVERTICULOSIS, COLON 08/28/2008    Qualifier: Diagnosis of  By: [name], [name], [name]    . IRON DEFICIENCY ANEMIA, HX OF 07/24/2007    Qualifier: Diagnosis of  By: Tilden Dome    . Chronic nausea   . Chronic back pain   . PONV (postoperative nausea and vomiting)     Allergies:  Allergies  Allergen Reactions  . Aspartame Diarrhea and Nausea And Vomiting  . Daliresp [Roflumilast] Nausea And Vomiting  . Levofloxacin  Nausea And Vomiting  . Shellfish Allergy Other (See Comments)    Flushing  . Tramadol Other (See Comments)    Blurred vision   . Ace Inhibitors Cough    Per Dr Melvyn Novas pulmonology 2013  . Biaxin [Clarithromycin] Other (See Comments)    PATIENT CAN TOLERATE Z-PAK.  Is written on patient's paper chart.  . Erythromycin Other (See Comments)    PATIENT CAN TOLERATE Z-PAK.  It is written on patient's paper chart.  [name]  Hydrocodone Nausea Only     Review of Systems:  Review of Systems  Constitutional: Positive for malaise/fatigue. Negative for fever, chills, weight loss and diaphoresis.  HENT: Negative for congestion, ear discharge, sore throat and tinnitus.   Respiratory: Negative for cough, sputum production, shortness of breath and wheezing.   Cardiovascular: Negative for chest pain, palpitations and leg swelling.  Gastrointestinal: Negative for heartburn, nausea, vomiting, abdominal pain, diarrhea, constipation, blood in stool and melena.  Genitourinary: Negative for dysuria, urgency and frequency.  Musculoskeletal: Positive for back pain.  Skin: Negative.   Neurological: Negative for dizziness, sensory change, focal weakness, loss of consciousness and headaches.    Family history- Review and unchanged  Social history- Review and unchanged  Physical Exam: BP 138/70 mmHg  Pulse 78  Temp(Src) 98.2 F (36.8 C) (Temporal)  Resp 16  Ht 5' 3.75" (1.619 m)  Wt 137 lb (62.143 kg)  BMI 23.71 kg/m2 Wt Readings from Last 3 Encounters:  08/20/14 137 lb (62.143 kg)  05/06/14 134 lb (60.782 kg)  03/27/14 137 lb 12.8 oz (62.506 kg)    General Appearance: Well nourished well developed, Dressed nicely in no apparent distress. Eyes: PERRLA, EOMs, conjunctiva no swelling or erythema ENT/Mouth: Ear canals normal without obstruction, swelling, erythma, discharge.  TMs normal bilaterally.  Oropharynx moist, clear, without exudate, or postoropharyngeal swelling. Neck: Supple, thyroid normal,no  cervical adenopathy  Respiratory: Respiratory effort normal, Breath sounds clear A&P without rhonchi, wheeze, or rale.  No retractions, no accessory usage. Cardio: RRR with no MRGs. Brisk peripheral pulses without edema. 2+ DP, PT pulses bilaterally Abdomen: Soft, + BS,  Non tender, no guarding, rebound, hernias, masses. Musculoskeletal: Full ROM, 5/5 strength, Normal gait, mild skeletal kyphosis Skin: Warm, dry without rashes, lesions, ecchymosis.  Neuro: Awake and oriented X 3, Cranial nerves intact. Normal muscle tone, no cerebellar symptoms. Psych: Normal affect, Insight and Judgment appropriate.    FORCUCCI, Natalya Domzalski, PA-C 9:01 AM [facility]

## 2014-08-20 NOTE — Patient Instructions (Signed)
    Bad carbs also include fruit juice, alcohol, and sweet tea. These are empty calories that do not signal to your brain that you are full.   Please remember the good carbs are still carbs which convert into sugar. So please measure them out no more than 1/2-1 cup of rice, oatmeal, pasta, and beans  Veggies are however free foods! Pile them on.   Not all fruit is created equal. Please see the list below, the fruit at the bottom is higher in sugars than the fruit at the top. Please avoid all dried fruits.     

## 2014-08-21 LAB — BASIC METABOLIC PANEL WITH GFR
BUN: 27 mg/dL — ABNORMAL HIGH (ref 6–23)
CO2: 26 meq/L (ref 19–32)
Calcium: 9.1 mg/dL (ref 8.4–10.5)
Chloride: 107 mEq/L (ref 96–112)
Creat: 0.86 mg/dL (ref 0.50–1.10)
GFR, EST AFRICAN AMERICAN: 76 mL/min
GFR, Est Non African American: 66 mL/min
Glucose, Bld: 74 mg/dL (ref 70–99)
POTASSIUM: 4.2 meq/L (ref 3.5–5.3)
SODIUM: 141 meq/L (ref 135–145)

## 2014-08-21 LAB — HEPATIC FUNCTION PANEL
ALT: 29 U/L (ref 0–35)
AST: 18 U/L (ref 0–37)
Albumin: 3.8 g/dL (ref 3.5–5.2)
Alkaline Phosphatase: 51 U/L (ref 39–117)
BILIRUBIN TOTAL: 0.7 mg/dL (ref 0.2–1.2)
Bilirubin, Direct: 0.2 mg/dL (ref 0.0–0.3)
Indirect Bilirubin: 0.5 mg/dL (ref 0.2–1.2)
Total Protein: 6.2 g/dL (ref 6.0–8.3)

## 2014-08-21 LAB — LIPID PANEL
Cholesterol: 175 mg/dL (ref 0–200)
HDL: 55 mg/dL (ref >=46)
LDL Cholesterol: 90 mg/dL (ref 0–99)
Total CHOL/HDL Ratio: 3.2 Ratio
Triglycerides: 152 mg/dL — ABNORMAL HIGH (ref <150)
VLDL: 30 mg/dL (ref 0–40)

## 2014-08-21 LAB — HEMOGLOBIN A1C
HEMOGLOBIN A1C: 5.9 % — AB (ref <5.7)
Mean Plasma Glucose: 123 mg/dL — ABNORMAL HIGH (ref <117)

## 2014-08-21 LAB — TSH: TSH: 0.99 u[IU]/mL (ref 0.350–4.500)

## 2014-08-21 LAB — VITAMIN D 25 HYDROXY (VIT D DEFICIENCY, FRACTURES): Vit D, 25-Hydroxy: 54 ng/mL (ref 30–100)

## 2014-08-21 LAB — MAGNESIUM: MAGNESIUM: 2.2 mg/dL (ref 1.5–2.5)

## 2014-08-21 LAB — INSULIN, FASTING: Insulin fasting, serum: 27.3 u[IU]/mL — ABNORMAL HIGH (ref 2.0–19.6)

## 2014-08-30 ENCOUNTER — Emergency Department (HOSPITAL_COMMUNITY)
Admission: EM | Admit: 2014-08-30 | Discharge: 2014-08-31 | Disposition: A | Discharge status: Home / Self Care | Payer: Medicare Other | Attending: Emergency Medicine | Admitting: Emergency Medicine

## 2014-08-30 ENCOUNTER — Encounter (HOSPITAL_COMMUNITY): Payer: Self-pay | Admitting: Emergency Medicine

## 2014-08-30 ENCOUNTER — Emergency Department (HOSPITAL_COMMUNITY): Payer: Medicare Other

## 2014-08-30 DIAGNOSIS — K219 Gastro-esophageal reflux disease without esophagitis: Secondary | ICD-10-CM | POA: Diagnosis not present

## 2014-08-30 DIAGNOSIS — Z8673 Personal history of transient ischemic attack (TIA), and cerebral infarction without residual deficits: Secondary | ICD-10-CM | POA: Diagnosis not present

## 2014-08-30 DIAGNOSIS — Z8701 Personal history of pneumonia (recurrent): Secondary | ICD-10-CM | POA: Insufficient documentation

## 2014-08-30 DIAGNOSIS — J45909 Unspecified asthma, uncomplicated: Secondary | ICD-10-CM | POA: Insufficient documentation

## 2014-08-30 DIAGNOSIS — G8929 Other chronic pain: Secondary | ICD-10-CM | POA: Diagnosis not present

## 2014-08-30 DIAGNOSIS — Z8639 Personal history of other endocrine, nutritional and metabolic disease: Secondary | ICD-10-CM | POA: Diagnosis not present

## 2014-08-30 DIAGNOSIS — Z79899 Other long term (current) drug therapy: Secondary | ICD-10-CM | POA: Diagnosis not present

## 2014-08-30 DIAGNOSIS — R079 Chest pain, unspecified: Secondary | ICD-10-CM | POA: Insufficient documentation

## 2014-08-30 DIAGNOSIS — I10 Essential (primary) hypertension: Secondary | ICD-10-CM | POA: Diagnosis not present

## 2014-08-30 DIAGNOSIS — Z862 Personal history of diseases of the blood and blood-forming organs and certain disorders involving the immune mechanism: Secondary | ICD-10-CM | POA: Diagnosis not present

## 2014-08-30 DIAGNOSIS — F419 Anxiety disorder, unspecified: Secondary | ICD-10-CM | POA: Insufficient documentation

## 2014-08-30 DIAGNOSIS — I2510 Atherosclerotic heart disease of native coronary artery without angina pectoris: Secondary | ICD-10-CM | POA: Diagnosis not present

## 2014-08-30 LAB — CBC
HEMATOCRIT: 40.8 % (ref 36.0–46.0)
Hemoglobin: 13.4 g/dL (ref 12.0–15.0)
MCH: 32.2 pg (ref 26.0–34.0)
MCHC: 32.8 g/dL (ref 30.0–36.0)
MCV: 98.1 fL (ref 78.0–100.0)
Platelets: 294 10*3/uL (ref 150–400)
RBC: 4.16 MIL/uL (ref 3.87–5.11)
RDW: 14.3 % (ref 11.5–15.5)
WBC: 6.4 10*3/uL (ref 4.0–10.5)

## 2014-08-30 LAB — BASIC METABOLIC PANEL
Anion gap: 8 (ref 5–15)
BUN: 33 mg/dL — ABNORMAL HIGH (ref 6–23)
CALCIUM: 9.2 mg/dL (ref 8.4–10.5)
CO2: 24 mmol/L (ref 19–32)
Chloride: 107 mmol/L (ref 96–112)
Creatinine, Ser: 1.1 mg/dL (ref 0.50–1.10)
GFR calc Af Amer: 55 mL/min — ABNORMAL LOW (ref >90)
GFR calc non Af Amer: 48 mL/min — ABNORMAL LOW (ref >90)
Glucose, Bld: 148 mg/dL — ABNORMAL HIGH (ref 70–99)
Potassium: 4.4 mmol/L (ref 3.5–5.1)
SODIUM: 139 mmol/L (ref 135–145)

## 2014-08-30 LAB — I-STAT TROPONIN, ED: TROPONIN I, POC: 0 ng/mL (ref 0.00–0.08)

## 2014-08-30 MED ORDER — SUCRALFATE 1 G PO TABS: 1.0000 g | ORAL_TABLET | Freq: Three times a day (TID) | ORAL | Status: DC

## 2014-08-30 MED ORDER — GI COCKTAIL ~~LOC~~
30.0000 mL | Freq: Once | ORAL | Status: AC
  Administered 2014-08-30: 30 mL via ORAL
  Filled 2014-08-30: qty 30

## 2014-08-30 NOTE — ED Notes (Signed)
Patient here with new onset chest pain. States it began today. Pain is not exacerbated by exertion, but movement makes her "feel weak". Denies other anginal equivalents.

## 2014-08-30 NOTE — ED Provider Notes (Signed)
CSN: [id number]     Arrival date & time 08/30/14  2059 History   First MD Initiated Contact with Patient 08/30/14 2124     Chief Complaint  Patient presents with  . Chest Pain     (Consider location/radiation/quality/duration/timing/severity/associated sxs/prior Treatment) Patient is a 79 y.o. female presenting with chest pain. The history is provided by the patient.  Chest Pain Pain location:  Substernal area and L chest Pain quality: pressure   Pain radiates to:  Does not radiate Pain radiates to the back: no   Pain severity:  Moderate Onset quality:  Sudden Duration:  10 hours Timing:  Constant Progression:  Waxing and waning Chronicity:  New Context comment:  Started today after breakfast but before lunch Relieved by:  None tried Worsened by:  Nothing tried Ineffective treatments:  None tried Associated symptoms: abdominal pain   Associated symptoms: no cough, no fever, no lower extremity edema, no nausea, no palpitations, no shortness of breath and no weakness   Risk factors: coronary artery disease, high cholesterol and hypertension   Risk factors: no diabetes mellitus, no prior DVT/PE and no smoking   Risk factors comment:  Positive family hx   Past Medical History  Diagnosis Date  . CAD (coronary artery disease)     LHC 2003 with 40% pLAD, 30% mLAD, 100% D1 (moderate vessel), 100%  D2 (small vessel), 95% mid small OM2.  Pt had PTCA to D1;  D2 and OM2 small vessels and tx medically;  Last Myoview (2012): anterior infarct seen with scar, no ischemia. EF normal. Pt managed medically;  Lexiscan Myoview (12/14):  Low risk; ant scar with very mild peri-infarct ischemia; EF 55% with ant AK   . Recurrent aspiration bronchitis/pneumonia     PFTs 12/09 FVC 97%, FEV1 101%, ratio 77%, TLC 109%. No significant obstruction or restriction.   . Hypertension   . Hyperlipidemia   . Esophageal stricture   . Gastritis   . GERD (gastroesophageal reflux disease)   . Asthma   . Blood  transfusion   . Cough   . Wheezing   . Weakness   . Trouble swallowing   . Abdominal pain   . Hiatal hernia   . Shortness of breath   . Anxiety   . TIA (transient ischemic attack)   . BRONCHITIS, ACUTE 07/25/2007         . DIVERTICULOSIS, COLON 08/28/2008    Qualifier: Diagnosis of  By: [name], [name], [name]    . IRON DEFICIENCY ANEMIA, HX OF 07/24/2007    Qualifier: Diagnosis of  By: Tilden Dome    . Chronic nausea   . Chronic back pain   . PONV (postoperative nausea and vomiting)    Past Surgical History  Procedure Laterality Date  . Back surgery    . Bladder repair    . Hemorrhoid surgery    . Cholecystectomy      Patient unsure of date.  [name] Appendectomy      patient unsure of date  . Total abdominal hysterectomy  1975    partial  . Coronary angioplasty  11/16/2001  . Eye surgery  11/2000    bilateral cataracts with lens implant  . Hiatal hernia repair  06/03/2011    Procedure: LAPAROSCOPIC REPAIR OF HIATAL HERNIA;  Surgeon: Adin Hector, MD;  Location: [facility];  Service: General;  Laterality: N/A;  . Laparoscopic nissen fundoplication  8/84/1660    Procedure: LAPAROSCOPIC NISSEN FUNDOPLICATION;  Surgeon: Adin Hector, MD;  Location: [facility];  Service: General;  Laterality: N/A;  . Lumbar laminectomy/decompression microdiscectomy Right 06/26/2012    Procedure: LUMBAR LAMINECTOMY/DECOMPRESSION MICRODISCECTOMY;  Surgeon: Jessy Oto, MD;  Location: [facility];  Service: Orthopedics;  Laterality: Right;  RIGHT L4-5 MICRODISCECTOMY  . Esophagogastroduodenoscopy N/A 03/15/2013    Procedure: ESOPHAGOGASTRODUODENOSCOPY (EGD);  Surgeon: Jerene Bears, MD;  Location[facility];  Service: Endoscopy;  Laterality: N/A;  . Esophageal manometry N/A 03/25/2013    Procedure: ESOPHAGEAL MANOMETRY (EM);  Surgeon: Inda Castle, MD;  Location: [facility];  Service: Endoscopy;  Laterality: N/A;  . Hiatal hernia repair N/A 04/24/2013    Procedure: LAPAROSCOPIC  REPAIR RECURRENT  PARASOPHAGEAL HIATAL HERNIA WITH FUNDOPLICATION;  Surgeon: Adin Hector, MD;  Location: [facility];  Service: General;  Laterality: N/A;  . Insertion of mesh N/A 04/24/2013    Procedure: INSERTION OF MESH;  Surgeon: Adin Hector, MD;  Location: [facility];  Service: General;  Laterality: N/A;  . Laparoscopic lysis of adhesions N/A 04/24/2013    Procedure: LAPAROSCOPIC LYSIS OF ADHESIONS;  Surgeon: Adin Hector, MD;  Location: [facility];  Service: General;  Laterality: N/A;  . Fixation kyphoplasty lumbar spine  X 2    "L3-4"   Family History  Problem Relation Age of Onset  . Cancer Mother     colon   . Colon cancer Mother 76  . Heart disease Father 66    heart attack   History  Substance Use Topics  . Smoking status: Never Smoker   . Smokeless tobacco: Never Used  . Alcohol Use: No   OB History    No data available     Review of Systems  Constitutional: Negative for fever.  Respiratory: Negative for cough and shortness of breath.   Cardiovascular: Positive for chest pain. Negative for palpitations.  Gastrointestinal: Positive for abdominal pain. Negative for nausea.  Neurological: Negative for weakness.  All other systems reviewed and are negative.     Allergies  Aspartame; Daliresp; Levofloxacin; Shellfish allergy; Tramadol; Ace inhibitors; Biaxin; Erythromycin; and Hydrocodone  Home Medications   Prior to Admission medications   Medication Sig Start Date End Date Taking? Authorizing Provider  acetaminophen (TYLENOL) 500 MG tablet Take 500 mg by mouth every 6 (six) hours as needed for moderate pain.   Yes Historical Provider, MD  Aclidinium Bromide (TUDORZA PRESSAIR) 400 MCG/ACT AEPB Inhale 1 puff into the lungs 2 (two) times daily as needed (wheezing).    Yes Historical Provider, MD  albuterol (PROVENTIL HFA;VENTOLIN HFA) 108 (90 BASE) MCG/ACT inhaler Inhale 1-2 puffs into the lungs every 6 (six) hours as needed for wheezing or shortness of breath.   Yes Historical  Provider, MD  ALPRAZolam Duanne Moron) 0.5 MG tablet Take 1 tablet (0.5 mg total) by mouth 3 (three) times daily as needed for sleep or anxiety. 03/28/14  Yes [name], MD  amLODipine (NORVASC) 10 MG tablet Take 5 mg by mouth at bedtime. For blood pressure   Yes Historical Provider, MD  Cholecalciferol (VITAMIN D3) 2000 UNITS TABS Take 6,000 Units by mouth daily.    Yes Historical Provider, MD  diclofenac (VOLTAREN) 75 MG EC tablet Take 75 mg by mouth 2 (two) times daily.   Yes Historical Provider, MD  feeding supplement, GLUCERNA SHAKE, (GLUCERNA SHAKE) LIQD Take 237 mLs by mouth 3 (three) times daily between meals. 12/05/13  Yes Shanker Kristeen Mans, MD  gabapentin (NEURONTIN) 300 MG capsule Take 300 mg by mouth 3 (three) times daily.   Yes Historical  Provider, MD  ipratropium-albuterol (DUONEB) 0.5-2.5 (3) MG/3ML SOLN USE ONE VIAL IN NEBULIZER 4 TIMES DAILY Patient taking differently: Inhale 3 mLs into the lungs every 6 (six) hours as needed. For shortness of breath 02/26/14  Yes [name], MD  Linaclotide [facility]) 145 MCG CAPS capsule Take 1 capsule (145 mcg total) by mouth daily. 03/27/14  Yes Inda Castle, MD  losartan (COZAAR) 100 MG tablet TAKE ONE TABLET BY MOUTH ONCE DAILY (DISCONTINUE  BENICAR) 07/01/14  Yes [name], MD  metoprolol (LOPRESSOR) 50 MG tablet TAKE ONE TABLET BY MOUTH TWICE DAILY FOR BLOOD PRESSURE 02/27/14  Yes [name], MD  mometasone-formoterol [facility]) 200-5 MCG/ACT AERO Inhale 2 puffs into the lungs 2 (two) times daily. 07/03/13  Yes Melissa Smith, PA-C  ondansetron (ZOFRAN ODT) 4 MG disintegrating tablet Take 1 tablet (4 mg total) by mouth every 8 (eight) hours as needed for nausea or vomiting. 04/23/14  Yes Carlisle Cater, PA-C  OVER THE COUNTER MEDICATION Take 1,000 mg by mouth 2 (two) times daily. Coral Calcium 1000mg    Yes Historical Provider, MD  OVER THE COUNTER MEDICATION Take 400 mg by mouth 2 (two) times daily. Magnesium Carbonate 400mg    Yes  Historical Provider, MD  polyethylene glycol (MIRALAX / GLYCOLAX) packet Take 17 g by mouth 2 (two) times daily.   Yes Historical Provider, MD  senna (SENOKOT) 8.6 MG TABS tablet Take 1 tablet by mouth 2 (two) times daily.  12/05/13  Yes Shanker Kristeen Mans, MD  azithromycin (ZITHROMAX) 250 MG tablet 2 tablets by mouth today then one tablet daily for 4 days. Patient not taking: Reported on 08/30/2014 06/24/14   Vicie Mutters, PA-C  prochlorperazine (COMPAZINE) 10 MG tablet TAKE 1 TABLET 3 TIMES A DAY BEFORE MEALS Patient not taking: Reported on 08/30/2014 08/11/14   [name], MD  prochlorperazine (COMPAZINE) 10 MG tablet TAKE 1 TABLET 3 TIMES A DAY BEFORE MEALS Patient not taking: Reported on 08/30/2014 08/13/14   [name], MD   BP 156/76 mmHg  Pulse 64  Temp(Src) 98 F (36.7 C) (Oral)  Resp 14  Ht 5\' 5"  (1.651 m)  Wt 130 lb (58.968 kg)  BMI 21.63 kg/m2  SpO2 98% Physical Exam  Constitutional: She is oriented to person, place, and time. She appears well-developed and well-nourished. No distress.  HENT:  Head: Normocephalic and atraumatic.  Mouth/Throat: Oropharynx is clear and moist.  Eyes: Conjunctivae and EOM are normal. Pupils are equal, round, and reactive to light.  Neck: Normal range of motion. Neck supple.  Cardiovascular: Normal rate, regular rhythm and intact distal pulses.   No murmur heard. Pulmonary/Chest: Effort normal and breath sounds normal. No respiratory distress. She has no wheezes. She has no rales. She exhibits no tenderness.  Abdominal: Soft. She exhibits no distension. There is tenderness in the epigastric area. There is no rebound and no guarding.  Musculoskeletal: Normal range of motion. She exhibits no edema or tenderness.  Neurological: She is alert and oriented to person, place, and time.  Skin: Skin is warm and dry. No rash noted. No erythema.  Psychiatric: She has a normal mood and affect. Her behavior is normal.  Nursing note and vitals  reviewed.   ED Course  Procedures (including critical care time) Labs Review Labs Reviewed  BASIC METABOLIC PANEL - Abnormal; Notable for the following:    Glucose, Bld 148 (*)    BUN 33 (*)    GFR calc non Af Amer 48 (*)    GFR calc Af  Amer 55 (*)    All other components within normal limits  CBC  I-STAT TROPOININ, ED    Imaging Review Dg Chest 2 View  08/30/2014   CLINICAL DATA:  Initial evaluation for acute chest pain.  EXAM: CHEST  2 VIEW  COMPARISON:  Prior radiograph from 12/21/2013  FINDINGS: Transverse heart size at the upper limits of normal, stable. Mediastinal silhouette within normal limits.  The lungs are normally inflated. Linear opacity at the peripheral left lung base most consistent with atelectasis or and/or scar. No airspace consolidation, pleural effusion, or pulmonary edema is identified. There is no pneumothorax.  No acute osseous abnormality identified. Sequelae of prior vertebral augmentation seen within the lower thoracic/upper lumbar spine.  IMPRESSION: No active cardiopulmonary disease.   Electronically Signed   By: Jeannine Boga M.D.   On: 08/30/2014 23:28     EKG Interpretation   Date/Time:  Saturday August 30 2014 21:03:17 EDT Ventricular Rate:  71 PR Interval:  132 QRS Duration: 84 QT Interval:  376 QTC Calculation: 408 R Axis:   -3 Text Interpretation:  Normal sinus rhythm Low voltage QRS Septal infarct ,  age undetermined No significant change since last tracing Confirmed by  Maryan Rued  MD, Loree Fee ([id number]) on 08/30/2014 9:24:30 PM      MDM   Final diagnoses:  Chest pain  Gastroesophageal reflux disease, esophagitis presence not specified    Patient presenting today with chest tightness that's been persistent since before 12 PM today. It started after eating breakfast this morning and she describes a pet pressure and pain type sensation in the left side of the chest and epigastric area. No shortness of breath, nausea, vomiting. Eating  does not make it worse. Patient denies having prior pain like this. No unilateral leg pain or swelling. No prior history of DVT. No cough or infectious symptoms. No signs of fluid overload. On exam patient has mild epigastric tenderness but no reproducible chest pain or pleuritic chest pain. Patient's heart score is 4 she does have known coronary artery disease that was seen on catheterization that required stenting.  Patient has now had pain for greater than 12 hours and has an unremarkable EKG, negative troponin, normal CBC and BMP. Chest x-ray pending. Will initially try GI cocktail as feel there is a lower suspicion that this is cardiac given normal above testing.  Low suspicion for PE at this time. Patient has been taking  voltaren for her back and does have a history of a hiatal hernia which could exacerbate her symptoms and cause GERD type symptoms.  11:46 PM CXR wnl.  Pain improved after GI cocktail.  Blanchie Dessert, MD 08/30/14 [phone]

## 2014-09-01 ENCOUNTER — Telehealth: Payer: Self-pay | Admitting: Cardiology

## 2014-09-01 DIAGNOSIS — L820 Inflamed seborrheic keratosis: Secondary | ICD-10-CM | POA: Diagnosis not present

## 2014-09-01 NOTE — Telephone Encounter (Signed)
New Message      Pt calling stating that she recently was at [facility] for chest pain and was told that she needed to check with Dr. Aundra Dubin to see if she needed a stress test or if she needed to see him based on the notes from her most recent hospital visit. Please call back and advise.

## 2014-09-01 NOTE — Telephone Encounter (Signed)
Discussed Dr Claris Gladden receommendations with patient.

## 2014-09-01 NOTE — Telephone Encounter (Signed)
Sounds like pain was GI, think it came from Voltaren use potentially.  Negative workup in ER.  Would defer stress test for now unless she has further chest pain.  Any more chest pain, she will need Cardiolite (prior studies have shown a fairly large anterior defect so this may confuse interpretation).

## 2014-09-01 NOTE — Telephone Encounter (Signed)
Pt states she went to [facility] ED Saturday 08/30/14 because of chest pain and was discharged after some testing.  Pt states she has a history of a hiatal hernia and was told to stop a medication she was taking for pain because that may be contributing to her chest pain. Pt states she was advised to call Dr Aundra Dubin and ask if she should have a stress test done.  Pt denies any chest pain since ED visit Saturday.  Pt advised I will forward to Dr Aundra Dubin for review.

## 2014-09-03 DIAGNOSIS — M47817 Spondylosis without myelopathy or radiculopathy, lumbosacral region: Secondary | ICD-10-CM | POA: Diagnosis not present

## 2014-09-03 DIAGNOSIS — Z79899 Other long term (current) drug therapy: Secondary | ICD-10-CM | POA: Diagnosis not present

## 2014-09-03 DIAGNOSIS — G894 Chronic pain syndrome: Secondary | ICD-10-CM | POA: Diagnosis not present

## 2014-09-03 DIAGNOSIS — M545 Low back pain: Secondary | ICD-10-CM | POA: Diagnosis not present

## 2014-09-03 DIAGNOSIS — M79606 Pain in leg, unspecified: Secondary | ICD-10-CM | POA: Diagnosis not present

## 2014-09-04 ENCOUNTER — Encounter: Payer: Self-pay | Admitting: Cardiology

## 2014-09-04 ENCOUNTER — Telehealth: Payer: Self-pay | Admitting: Cardiology

## 2014-09-04 ENCOUNTER — Encounter: Payer: Self-pay | Admitting: *Deleted

## 2014-09-04 ENCOUNTER — Ambulatory Visit (INDEPENDENT_AMBULATORY_CARE_PROVIDER_SITE_OTHER): Payer: Medicare Other | Admitting: Cardiology

## 2014-09-04 VITALS — BP 124/62 | HR 82

## 2014-09-04 DIAGNOSIS — R0602 Shortness of breath: Secondary | ICD-10-CM | POA: Diagnosis not present

## 2014-09-04 DIAGNOSIS — I25719 Atherosclerosis of autologous vein coronary artery bypass graft(s) with unspecified angina pectoris: Secondary | ICD-10-CM

## 2014-09-04 DIAGNOSIS — E782 Mixed hyperlipidemia: Secondary | ICD-10-CM | POA: Diagnosis not present

## 2014-09-04 MED ORDER — OMEPRAZOLE 40 MG PO CPDR: 40.0000 mg | DELAYED_RELEASE_CAPSULE | Freq: Every day | ORAL | Status: DC

## 2014-09-04 MED ORDER — ATORVASTATIN CALCIUM 10 MG PO TABS: 10.0000 mg | ORAL_TABLET | Freq: Every day | ORAL | Status: DC

## 2014-09-04 MED ORDER — NITROGLYCERIN 0.4 MG SL SUBL: 0.4000 mg | SUBLINGUAL_TABLET | SUBLINGUAL | Status: AC | PRN

## 2014-09-04 NOTE — Telephone Encounter (Signed)
Pt states she had muscle aches from atorvastatin in the past and was unable to tolerate it.  Pt states she was on pravastatin 40mg  daily and did not have any problems tolerating it.  Pt states this was put on hold by her PCP around Christmas, pt states she thinks it put on hold because her cholesterol was so good, she states she had been on this for years.  Pt advised I will forward to Dr Aundra Dubin for review.

## 2014-09-04 NOTE — Telephone Encounter (Signed)
New problem   Pt was prescribe Lipitor by Dr Aundra Dubin, but forgot to tell Dr Aundra Dubin she can't take it. Please call pt,

## 2014-09-04 NOTE — Progress Notes (Signed)
Patient ID: [name] [name], female   DOB: 1936-01-08, 79 y.o.   MRN: [id number] PCP: Dr. Melford Aase  79 yo with h/o CAD s/p PTCA to D1 in 2003 presents for cardiology followup. She has had trouble with recurrent asthma/bronchitis flares.  She has had chronic low back pain.  She has also had problems with GERD long-term, had Nissen fundoplication and more recently had repair of recurrent paraesophageal hernia. Last Cardiolite in 12/14 showed apical anterior infarct with mild peri-infarct ischemia.     Patient developed pain under her left breast on 4/23.  This lasted for hours.  She went to the ER for evaluation.  Her pain resolved with a GI cocktail.  Troponin was negative.  She had been on Voltaren long-term, and the ER MD was concerned that she could have gastritis in the setting of Voltaren use.  Voltaren was stopped.  She has had no chest pain since that time.  She had had no recent chest pain prior to that day.  ECG was unchanged.  Of note, she has not been taking a PPI. No melena or BRBPR.   Patient chronically has dyspnea walking up stairs or up hill.  No dyspnea walking on flat ground.  No orthopnea/PND. She is not taking a statin or aspirin.  She is not sure why.  Labs (5/10): LDL 69, HDL 75  Labs (6/12): LDL 56, HDL 87, BNP 30, HCT 42.4 Labs (4/16): K 4.4, creatinine 1.0, TSH normal, LDL 90, HDL 55, TnI 0, HCT 40.8  ECG: NSR, septal Qs (no change)  Allergies (verified):  1) ! Aspartame (Aspartame)  2) ! * Mycins   Past Medical History:  1. CAD: [address] 2003 with 40% pLAD, 30% mLAD, 100% D1 (moderate vessel), 100% D2 (small vessel), 95% mid small OM2. Pt had PTCA to D1. Last myoview (5/09): EF 66%, anteroseptal/apical infarct with minimal peri-infarct ischemia. No significant change from 11/06. Pt managed medically.  Lexiscan Cardiolite (11/12) with apical anterior infarction, no ischemia, EF 68%.  Lexiscan Cardiolite (12/14) with EF 55%, apical anterior infarct with mild peri-infarct ischemia.   Echo (11/14) with EF 27-25%, PA systolic pressure 41 mmHg.  2. Recurrent bronchitis, suspect asthma: PFTs 12/09 FVC 97%, FEV1 101%, ratio 77%, TLC 109%. No significant obstruction or restriction.  Never smoked.  3. HYPERTENSION   4. HYPERLIPIDEMIA   5. ESOPHAGEAL STRICTURE  6. DIVERTICULOSIS 7. HIATAL HERNIA WITH REFLUX: s/p Nissen fundoplication.  She had repair of recurrent paraesophageal hernia in 12/14.  8. IRON DEFICIENCY ANEMIA: EGD, colonoscopy, and capsule endoscopy in fall 2010 unrevealing for source. She then had mesenteric angiogram in 1/11 with no bleeding source found.  ? small bowel AVMs.  9. Low back pain 10.  MGUS: Negative bone marrow biopsy.  11.  History of vertebral compression fracture.   Family History:  Colon cancer in her mother  Heart disease in her father.   Social History:  Patient never smoked.  She is retired since 2002, proior to that she worked at [employer]. She was exposed to acetone and other chemicals at her work.  Married  Alcohol Use - no  Drug Use - no   Review of Systems:  All systems reviewed and negative except as per HPI.   Current Outpatient Prescriptions  Medication Sig Dispense Refill  . acetaminophen (TYLENOL) 500 MG tablet Take 500 mg by mouth every 6 (six) hours as needed for moderate pain.    . Aclidinium Bromide (TUDORZA PRESSAIR) 400 MCG/ACT AEPB Inhale 1 puff  into the lungs 2 (two) times daily as needed (wheezing).     [name] albuterol (PROVENTIL HFA;VENTOLIN HFA) 108 (90 BASE) MCG/ACT inhaler Inhale 1-2 puffs into the lungs every 6 (six) hours as needed for wheezing or shortness of breath.    . ALPRAZolam (XANAX) 0.5 MG tablet Take 1 tablet (0.5 mg total) by mouth 3 (three) times daily as needed for sleep or anxiety. 90 tablet 5  . amLODipine (NORVASC) 10 MG tablet Take 5 mg by mouth at bedtime. For blood pressure    . Cholecalciferol (VITAMIN D3) 2000 UNITS TABS Take 6,000 Units by mouth daily.     . feeding supplement, GLUCERNA  SHAKE, (GLUCERNA SHAKE) LIQD Take 237 mLs by mouth 3 (three) times daily between meals. 90 Can 0  . gabapentin (NEURONTIN) 300 MG capsule Take 300 mg by mouth 3 (three) times daily.    . Linaclotide (LINZESS) 145 MCG CAPS capsule Take 1 capsule (145 mcg total) by mouth daily. 15 capsule 12  . losartan (COZAAR) 100 MG tablet TAKE ONE TABLET BY MOUTH ONCE DAILY (DISCONTINUE  BENICAR) 90 tablet 0  . metoprolol (LOPRESSOR) 50 MG tablet TAKE ONE TABLET BY MOUTH TWICE DAILY FOR BLOOD PRESSURE 180 tablet 1  . mometasone-formoterol (DULERA) 200-5 MCG/ACT AERO Inhale 2 puffs into the lungs 2 (two) times daily. 13 g 3  . ondansetron (ZOFRAN ODT) 4 MG disintegrating tablet Take 1 tablet (4 mg total) by mouth every 8 (eight) hours as needed for nausea or vomiting. 10 tablet 0  . OVER THE COUNTER MEDICATION Take 1,000 mg by mouth 2 (two) times daily. Coral Calcium 1015m    . OVER THE COUNTER MEDICATION Take 400 mg by mouth 2 (two) times daily. Magnesium Carbonate 4017m   . polyethylene glycol (MIRALAX / GLYCOLAX) packet Take 17 g by mouth 2 (two) times daily.    . prochlorperazine (COMPAZINE) 10 MG tablet TAKE 1 TABLET 3 TIMES A DAY BEFORE MEALS 90 tablet 3  . senna (SENOKOT) 8.6 MG TABS tablet Take 1 tablet by mouth 2 (two) times daily.     . sucralfate (CARAFATE) 1 G tablet Take 1 tablet (1 g total) by mouth 4 (four) times daily -  with meals and at bedtime. 60 tablet 0  . aspirin EC 81 MG tablet Take 1 tablet (81 mg total) by mouth.    . Marland Kitchentorvastatin (LIPITOR) 10 MG tablet Take 1 tablet (10 mg total) by mouth daily. 30 tablet 3  . azithromycin (ZITHROMAX) 250 MG tablet 2 tablets by mouth today then one tablet daily for 4 days. (Patient not taking: Reported on 08/30/2014) 6 tablet 1  . ipratropium-albuterol (DUONEB) 0.5-2.5 (3) MG/3ML SOLN USE ONE VIAL IN NEBULIZER 4 TIMES DAILY (Patient taking differently: Inhale 3 mLs into the lungs every 6 (six) hours as needed. For shortness of breath) 360 mL 99  .  nitroGLYCERIN (NITROSTAT) 0.4 MG SL tablet Place 1 tablet (0.4 mg total) under the tongue every 5 (five) minutes as needed for chest pain. 90 tablet 3  . omeprazole (PRILOSEC) 40 MG capsule Take 1 capsule (40 mg total) by mouth daily. 30 capsule 4   No current facility-administered medications for this visit.    BP 124/62 mmHg  Pulse 82 General: NAD Neck: No JVD, no thyromegaly or thyroid nodule.  Lungs: Clear to auscultation bilaterally with normal respiratory effort.  No wheezing.  CV: Nondisplaced PMI.  Heart regular S1/S2, no S3/S4, no murmur.  No peripheral edema.  No carotid bruit.  Normal pedal pulses.  Abdomen: Soft, nontender, no hepatosplenomegaly, no distention.  Neurologic: Alert and oriented x 3.  Psych: Normal affect. Extremities: No clubbing or cyanosis.   Assessment/Plan: 1. CAD: Prior CAD with D1 PTCA 2003.  Chest pain on 4/23 actually does seem GI, possibly due to gastritis.  It resolved with GI cocktail and has not recurred. Troponin and ECG unremarkable.   - I would like her to restart ASA 81 daily. - She will restart statin, use atorvastatin 10 mg daily.  - Echocardiogram to assess for any change in LV function.   - Start omeprazole 40 mg daily for GERD.   - Hold off on Cardiolite for now, will obtain Cardiolite if she has recurrent pain.  2.  Hyperlipidemia: Goal LDL < 70, starting atorvastatin today.  3.  Low back pain: Avoid Voltaren for now, may have caused gastritis.  Hemoglobin is stable.  Can use Tylenol for pain.   Loralie Champagne 09/04/2014

## 2014-09-04 NOTE — Telephone Encounter (Signed)
She can go back to pravastatin 40 mg daily with lipids/LFTs in 2 months.

## 2014-09-04 NOTE — Patient Instructions (Addendum)
Medication Instructions:  Start aspirin 81mg  daily.  Start atorvastatin 10mg  daily.  Start omeprazole 40mg  daily  Use Nitroglycerin as needed for chest pain.  Labwork: Your physician recommends that you return for a FASTING lipid profile /liver profile in 2 months.   Testing/Procedures: Your physician has requested that you have an echocardiogram. Echocardiography is a painless test that uses sound waves to create images of your heart. It provides your doctor with information about the size and shape of your heart and how well your heart's chambers and valves are working. This procedure takes approximately one hour. There are no restrictions for this procedure.    Follow-Up: Your physician recommends that you schedule a follow-up appointment in: 3 months with Dr Aundra Dubin.    Any Other Special Instructions Will Be Listed Below   PLEASE GO BY REGISTRATION SO YOU CAN GET REGISTERED FOR YOUR APPOINTMENT WITH DR [place].

## 2014-09-05 ENCOUNTER — Ambulatory Visit (HOSPITAL_COMMUNITY): Payer: Medicare Other | Attending: Cardiovascular Disease

## 2014-09-05 DIAGNOSIS — R0602 Shortness of breath: Secondary | ICD-10-CM | POA: Diagnosis not present

## 2014-09-05 NOTE — Telephone Encounter (Signed)
Ok

## 2014-09-05 NOTE — Progress Notes (Signed)
2D Echo completed. 09/05/2014

## 2014-09-05 NOTE — Telephone Encounter (Signed)
Pt states that her previous pravastatin dose was 20mg  daily-1/2 of a 40mg  tablet daily. Pt will take pravastatin 20mg  daily instead of atorvastatin, fasting lipid/liver profile is scheduled for 11/04/14.

## 2014-09-10 DIAGNOSIS — H11159 Pinguecula, unspecified eye: Secondary | ICD-10-CM | POA: Diagnosis not present

## 2014-09-12 DIAGNOSIS — M2550 Pain in unspecified joint: Secondary | ICD-10-CM | POA: Diagnosis not present

## 2014-09-12 DIAGNOSIS — M5137 Other intervertebral disc degeneration, lumbosacral region: Secondary | ICD-10-CM | POA: Diagnosis not present

## 2014-09-12 DIAGNOSIS — M170 Bilateral primary osteoarthritis of knee: Secondary | ICD-10-CM | POA: Diagnosis not present

## 2014-09-12 DIAGNOSIS — M19041 Primary osteoarthritis, right hand: Secondary | ICD-10-CM | POA: Diagnosis not present

## 2014-09-12 DIAGNOSIS — M810 Age-related osteoporosis without current pathological fracture: Secondary | ICD-10-CM | POA: Diagnosis not present

## 2014-09-12 DIAGNOSIS — Z79899 Other long term (current) drug therapy: Secondary | ICD-10-CM | POA: Diagnosis not present

## 2014-09-12 DIAGNOSIS — E559 Vitamin D deficiency, unspecified: Secondary | ICD-10-CM | POA: Diagnosis not present

## 2014-09-16 ENCOUNTER — Ambulatory Visit (INDEPENDENT_AMBULATORY_CARE_PROVIDER_SITE_OTHER): Payer: Medicare Other | Admitting: Internal Medicine

## 2014-09-16 ENCOUNTER — Encounter: Payer: Self-pay | Admitting: Internal Medicine

## 2014-09-16 VITALS — BP 126/70 | HR 80 | Temp 97.5°F | Resp 16 | Ht 63.5 in | Wt 133.2 lb

## 2014-09-16 DIAGNOSIS — Z79899 Other long term (current) drug therapy: Secondary | ICD-10-CM

## 2014-09-16 DIAGNOSIS — R110 Nausea: Secondary | ICD-10-CM | POA: Diagnosis not present

## 2014-09-16 DIAGNOSIS — R5383 Other fatigue: Secondary | ICD-10-CM

## 2014-09-16 DIAGNOSIS — G894 Chronic pain syndrome: Secondary | ICD-10-CM

## 2014-09-16 LAB — CBC WITH DIFFERENTIAL/PLATELET
BASOS PCT: 0 % (ref 0–1)
Basophils Absolute: 0 10*3/uL (ref 0.0–0.1)
Eosinophils Absolute: 0.1 10*3/uL (ref 0.0–0.7)
Eosinophils Relative: 1 % (ref 0–5)
HEMATOCRIT: 43.3 % (ref 36.0–46.0)
HEMOGLOBIN: 14.5 g/dL (ref 12.0–15.0)
LYMPHS ABS: 1.3 10*3/uL (ref 0.7–4.0)
Lymphocytes Relative: 15 % (ref 12–46)
MCH: 32.2 pg (ref 26.0–34.0)
MCHC: 33.5 g/dL (ref 30.0–36.0)
MCV: 96 fL (ref 78.0–100.0)
MONOS PCT: 10 % (ref 3–12)
MPV: 9.8 fL (ref 8.6–12.4)
Monocytes Absolute: 0.9 10*3/uL (ref 0.1–1.0)
NEUTROS PCT: 74 % (ref 43–77)
Neutro Abs: 6.6 10*3/uL (ref 1.7–7.7)
Platelets: 370 10*3/uL (ref 150–400)
RBC: 4.51 MIL/uL (ref 3.87–5.11)
RDW: 14.7 % (ref 11.5–15.5)
WBC: 8.9 10*3/uL (ref 4.0–10.5)

## 2014-09-16 LAB — BASIC METABOLIC PANEL WITH GFR
BUN: 18 mg/dL (ref 6–23)
CALCIUM: 10.5 mg/dL (ref 8.4–10.5)
CHLORIDE: 104 meq/L (ref 96–112)
CO2: 28 mEq/L (ref 19–32)
Creat: 0.96 mg/dL (ref 0.50–1.10)
GFR, Est African American: 66 mL/min
GFR, Est Non African American: 58 mL/min — ABNORMAL LOW
Glucose, Bld: 95 mg/dL (ref 70–99)
Potassium: 4.2 mEq/L (ref 3.5–5.3)
Sodium: 143 mEq/L (ref 135–145)

## 2014-09-16 LAB — HEPATIC FUNCTION PANEL
ALBUMIN: 4 g/dL (ref 3.5–5.2)
ALT: 125 U/L — ABNORMAL HIGH (ref 0–35)
AST: 21 U/L (ref 0–37)
Alkaline Phosphatase: 76 U/L (ref 39–117)
BILIRUBIN TOTAL: 0.7 mg/dL (ref 0.2–1.2)
Bilirubin, Direct: 0.2 mg/dL (ref 0.0–0.3)
Indirect Bilirubin: 0.5 mg/dL (ref 0.2–1.2)
TOTAL PROTEIN: 6.7 g/dL (ref 6.0–8.3)

## 2014-09-16 MED ORDER — GABAPENTIN 100 MG PO CAPS: 100.0000 mg | ORAL_CAPSULE | Freq: Three times a day (TID) | ORAL | Status: DC

## 2014-09-16 MED ORDER — PROCHLORPERAZINE MALEATE 5 MG PO TABS
ORAL_TABLET | ORAL | Status: DC
Start: 2014-09-16 — End: 2014-09-26

## 2014-09-16 NOTE — Patient Instructions (Signed)

## 2014-09-16 NOTE — Progress Notes (Signed)
Subjective:    Patient ID: [name] [name], female    DOB: 1935-09-05, 79 y.o.   MRN: [id number]  HPI Patient who has multiple co-morbidities incl HTN, HLD, ASCAD/CABG, MGUS, hx/o evanescent Transaminitis, Chronic pain Syndrome due to back pain (DDD/DJD) who presents c/o increased fatigue , weakness, lethargy, hypersomnolence and depression is note to be on several medicines any one of which would account for her sx's, which include Vicodin, Xanax, Compazine, Gabapentin 300 mg tid.   Medication Sig  . acetaminophen (TYLENOL) 500 MG tablet Take 500 mg by mouth every 6 (six) hours as needed for moderate pain.  . Aclidinium Bromide (TUDORZA PRESSAIR) 400 MCG/ACT AEPB Inhale 1 puff into the lungs 2 (two) times daily as needed (wheezing).   [name] albuterol (PROVENTIL HFA;VENTOLIN HFA) 108 (90 BASE) MCG/ACT inhaler Inhale 1-2 puffs into the lungs every 6 (six) hours as needed for wheezing or shortness of breath.  . ALPRAZolam (XANAX) 0.5 MG tablet Take 1 tablet (0.5 mg total) by mouth 3 (three) times daily as needed for sleep or anxiety.  [name] amLODipine (NORVASC) 10 MG tablet Take 5 mg by mouth at bedtime. For blood pressure  . aspirin EC 81 MG tablet Take 1 tablet (81 mg total) by mouth.  . Cholecalciferol (VITAMIN D3) 2000 UNITS TABS Take 6,000 Units by mouth daily.   . feeding supplement, GLUCERNA SHAKE, (GLUCERNA SHAKE) LIQD Take 237 mLs by mouth 3 (three) times daily between meals.  [name] ipratropium-albuterol (DUONEB) 0.5-2.5 (3) MG/3ML SOLN USE ONE VIAL IN NEBULIZER 4 TIMES DAILY (Patient taking differently: Inhale 3 mLs into the lungs every 6 (six) hours as needed. For shortness of breath)  . Linaclotide (LINZESS) 145 MCG CAPS capsule Take 1 capsule (145 mcg total) by mouth daily.  [name] losartan (COZAAR) 100 MG tablet TAKE ONE TABLET BY MOUTH ONCE DAILY (DISCONTINUE  BENICAR)  . metoprolol (LOPRESSOR) 50 MG tablet TAKE ONE TABLET BY MOUTH TWICE DAILY FOR BLOOD PRESSURE  . mometasone-formoterol (DULERA)  200-5 MCG/ACT AERO Inhale 2 puffs into the lungs 2 (two) times daily.  . nitroGLYCERIN (NITROSTAT) 0.4 MG SL tablet Place 1 tablet (0.4 mg total) under the tongue every 5 (five) minutes as needed for chest pain.  [name] ondansetron (ZOFRAN ODT) 4 MG disintegrating tablet Take 1 tablet (4 mg total) by mouth every 8 (eight) hours as needed for nausea or vomiting.  [name] OVER THE COUNTER MEDICATION Take 1,000 mg by mouth 2 (two) times daily. Coral Calcium 1000mg   . OVER THE COUNTER MEDICATION Take 400 mg by mouth 2 (two) times daily. Magnesium Carbonate 400mg   . polyethylene glycol (MIRALAX / GLYCOLAX) packet Take 17 g by mouth 2 (two) times daily.  [name] senna (SENOKOT) 8.6 MG TABS tablet Take 1 tablet by mouth 2 (two) times daily.   . sucralfate (CARAFATE) 1 G tablet Take 1 tablet (1 g total) by mouth 4 (four) times daily -  with meals and at bedtime.  . gabapentin (NEURONTIN) 300 MG capsule Take 300 mg by mouth 3 (three) times daily.  . prochlorperazine (COMPAZINE) 10 MG tablet TAKE 1 TABLET 3 TIMES A DAY BEFORE MEALS  . azithromycin (ZITHROMAX) 250 MG tablet 2 tablets by mouth today then one tablet daily for 4 days. (Patient not taking: Reported on 08/30/2014)  . omeprazole (PRILOSEC) 40 MG capsule Take 1 capsule (40 mg total) by mouth daily.  . pravastatin (PRAVACHOL) 20 MG tablet 1/2 of a 40mg  tablet daily   Allergies  Allergen Reactions  . Aspartame Diarrhea  and Nausea And Vomiting  . Daliresp [Roflumilast] Nausea And Vomiting  . Levofloxacin Nausea And Vomiting  . Shellfish Allergy Other (See Comments)    Flushing  . Tramadol Other (See Comments)    Blurred vision   . Atorvastatin Other (See Comments)    Muscle aches  . Ace Inhibitors Cough    Per Dr Melvyn Novas pulmonology 2013  . Biaxin [Clarithromycin] Other (See Comments)    PATIENT CAN TOLERATE Z-PAK.  Is written on patient's paper chart.  . Erythromycin Other (See Comments)    PATIENT CAN TOLERATE Z-PAK.  It is written on patient's paper chart.   . Hydrocodone Nausea Only   Past Medical History  Diagnosis Date  . CAD (coronary artery disease)     LHC 2003 with 40% pLAD, 30% mLAD, 100% D1 (moderate vessel), 100%  D2 (small vessel), 95% mid small OM2.  Pt had PTCA to D1;  D2 and OM2 small vessels and tx medically;  Last Myoview (2012): anterior infarct seen with scar, no ischemia. EF normal. Pt managed medically;  Lexiscan Myoview (12/14):  Low risk; ant scar with very mild peri-infarct ischemia; EF 55% with ant AK   . Recurrent aspiration bronchitis/pneumonia     PFTs 12/09 FVC 97%, FEV1 101%, ratio 77%, TLC 109%. No significant obstruction or restriction.   . Hypertension   . Hyperlipidemia   . Esophageal stricture   . Gastritis   . GERD (gastroesophageal reflux disease)   . Asthma   . Blood transfusion   . Cough   . Wheezing   . Weakness   . Trouble swallowing   . Abdominal pain   . Hiatal hernia   . Shortness of breath   . Anxiety   . TIA (transient ischemic attack)   . BRONCHITIS, ACUTE 07/25/2007         . DIVERTICULOSIS, COLON 08/28/2008    Qualifier: Diagnosis of  By: [name], [name], [name]    . IRON DEFICIENCY ANEMIA, HX OF 07/24/2007    Qualifier: Diagnosis of  By: Tilden Dome    . Chronic nausea   . Chronic back pain   . PONV (postoperative nausea and vomiting)    Past Surgical History  Procedure Laterality Date  . Back surgery    . Bladder repair    . Hemorrhoid surgery    . Cholecystectomy      Patient unsure of date.  [name] Appendectomy      patient unsure of date  . Total abdominal hysterectomy  1975    partial  . Coronary angioplasty  11/16/2001  . Eye surgery  11/2000    bilateral cataracts with lens implant  . Hiatal hernia repair  06/03/2011    Procedure: LAPAROSCOPIC REPAIR OF HIATAL HERNIA;  Surgeon: Adin Hector, MD;  Location: [facility];  Service: General;  Laterality: N/A;  . Laparoscopic nissen fundoplication  0/02/2724    Procedure: LAPAROSCOPIC NISSEN FUNDOPLICATION;  Surgeon: Adin Hector, MD;  Location: [facility];  Service: General;  Laterality: N/A;  . Lumbar laminectomy/decompression microdiscectomy Right 06/26/2012    Procedure: LUMBAR LAMINECTOMY/DECOMPRESSION MICRODISCECTOMY;  Surgeon: Jessy Oto, MD;  Location: [place];  Service: Orthopedics;  Laterality: Right;  RIGHT L4-5 MICRODISCECTOMY  . Esophagogastroduodenoscopy N/A 03/15/2013    Procedure: ESOPHAGOGASTRODUODENOSCOPY (EGD);  Surgeon: Jerene Bears, MD;  Location: [facility];  Service: Endoscopy;  Laterality: N/A;  . Esophageal manometry N/A 03/25/2013    Procedure: ESOPHAGEAL MANOMETRY (EM);  Surgeon: Inda Castle, MD;  Location: [facility];  Service: Endoscopy;  Laterality: N/A;  . Hiatal hernia repair N/A 04/24/2013    Procedure: LAPAROSCOPIC  REPAIR RECURRENT PARASOPHAGEAL HIATAL HERNIA WITH FUNDOPLICATION;  Surgeon: Adin Hector, MD;  Location: [facility];  Service: General;  Laterality: N/A;  . Insertion of mesh N/A 04/24/2013    Procedure: INSERTION OF MESH;  Surgeon: Adin Hector, MD;  Location: [facility];  Service: General;  Laterality: N/A;  . Laparoscopic lysis of adhesions N/A 04/24/2013    Procedure: LAPAROSCOPIC LYSIS OF ADHESIONS;  Surgeon: Adin Hector, MD;  Location: [facility];  Service: General;  Laterality: N/A;  . Fixation kyphoplasty lumbar spine  X 2    "L3-4"   Review of Systems  10 point systems review negative except as above.     Objective:   Physical Exam  BP 126/70 mmHg  Pulse 80  Temp(Src) 97.5 F (36.4 C)  Resp 16  Ht 5' 3.5" (1.613 m)  Wt 133 lb 3.2 oz (60.419 kg)  BMI 23.22 kg/m2  Appears sleepy & in NAD. O2 Sat 97%.   HEENT - Eac's patent. TM's Nl. EOM's full. PERRLA. NasoOroPharynx clear. Neck - supple. Nl Thyroid. Carotids 2+ & No bruits, nodes, JVD Chest - Clear equal BS w/o Rales, rhonchi, wheezes. Cor - Nl HS. RRR w/o sig MGR. PP 1(+). No edema. Abd - No palpable organomegaly, masses or tenderness. BS nl. MS- FROM w/o deformities. Muscle power, tone and  bulk Nl. Gait Nl. Neuro - Appears sleepy and having difficulty keeping eyes open. No obvious Cr N abnormalities. Sensory, motor and Cerebellar functions appear Nl w/o focal abnormalities. Psyche - Mental status normal & appropriate.  No delusions, ideations or obvious mood abnormalities.    Assessment & Plan:   1. Fatigue / Hypersomnolence  - Advised tapering doses of Alprazolam, Vicodin to lowest dose to control pain   2. Medication management-   - screen for metabolic derangement, infection, etc  - CBC with Differential/Platelet - BASIC METABOLIC PANEL WITH GFR - Hepatic function panel - Urine Microscopic  3. Chronic pain syndrome  - taper dose from 300 mg tid - gabapentin (NEURONTIN) 100 MG capsule; Take 1 capsule (100 mg total) by mouth 3 (three) times daily.  Dispense: 90 capsule; Refill: 5  4. Nausea without vomiting-   - taper dose from 10 mg tid  - prochlorperazine (COMPAZINE) 5 MG tablet; Take 1 capsule 3 x day before meals only if needed for Nausea  Dispense: 90 tablet; Refill: 5

## 2014-09-17 LAB — URINALYSIS, MICROSCOPIC ONLY
Bacteria, UA: NONE SEEN
CASTS: NONE SEEN
CRYSTALS: NONE SEEN
SQUAMOUS EPITHELIAL / LPF: NONE SEEN

## 2014-09-19 ENCOUNTER — Encounter (HOSPITAL_COMMUNITY): Payer: Self-pay | Admitting: *Deleted

## 2014-09-19 ENCOUNTER — Observation Stay (HOSPITAL_COMMUNITY)
Admission: EM | Admit: 2014-09-19 | Discharge: 2014-09-20 | Disposition: A | Discharge status: Home / Self Care | Payer: Medicare Other | Attending: Internal Medicine | Admitting: Internal Medicine

## 2014-09-19 ENCOUNTER — Emergency Department (HOSPITAL_COMMUNITY): Payer: Medicare Other

## 2014-09-19 DIAGNOSIS — Z8673 Personal history of transient ischemic attack (TIA), and cerebral infarction without residual deficits: Secondary | ICD-10-CM | POA: Insufficient documentation

## 2014-09-19 DIAGNOSIS — Z800 Family history of malignant neoplasm of digestive organs: Secondary | ICD-10-CM | POA: Diagnosis not present

## 2014-09-19 DIAGNOSIS — Z90710 Acquired absence of both cervix and uterus: Secondary | ICD-10-CM | POA: Diagnosis not present

## 2014-09-19 DIAGNOSIS — I2510 Atherosclerotic heart disease of native coronary artery without angina pectoris: Secondary | ICD-10-CM | POA: Insufficient documentation

## 2014-09-19 DIAGNOSIS — J45909 Unspecified asthma, uncomplicated: Secondary | ICD-10-CM | POA: Insufficient documentation

## 2014-09-19 DIAGNOSIS — Z7982 Long term (current) use of aspirin: Secondary | ICD-10-CM | POA: Insufficient documentation

## 2014-09-19 DIAGNOSIS — I10 Essential (primary) hypertension: Secondary | ICD-10-CM | POA: Insufficient documentation

## 2014-09-19 DIAGNOSIS — Z91013 Allergy to seafood: Secondary | ICD-10-CM | POA: Insufficient documentation

## 2014-09-19 DIAGNOSIS — R079 Chest pain, unspecified: Secondary | ICD-10-CM | POA: Diagnosis not present

## 2014-09-19 DIAGNOSIS — Z8249 Family history of ischemic heart disease and other diseases of the circulatory system: Secondary | ICD-10-CM | POA: Insufficient documentation

## 2014-09-19 DIAGNOSIS — K449 Diaphragmatic hernia without obstruction or gangrene: Secondary | ICD-10-CM | POA: Insufficient documentation

## 2014-09-19 DIAGNOSIS — K219 Gastro-esophageal reflux disease without esophagitis: Secondary | ICD-10-CM | POA: Diagnosis not present

## 2014-09-19 DIAGNOSIS — E785 Hyperlipidemia, unspecified: Secondary | ICD-10-CM | POA: Diagnosis not present

## 2014-09-19 DIAGNOSIS — I2583 Coronary atherosclerosis due to lipid rich plaque: Secondary | ICD-10-CM

## 2014-09-19 LAB — URINALYSIS, ROUTINE W REFLEX MICROSCOPIC
BILIRUBIN URINE: NEGATIVE
Glucose, UA: NEGATIVE mg/dL
Hgb urine dipstick: NEGATIVE
Ketones, ur: NEGATIVE mg/dL
Nitrite: NEGATIVE
PH: 6.5 (ref 5.0–8.0)
Protein, ur: NEGATIVE mg/dL
SPECIFIC GRAVITY, URINE: 1.015 (ref 1.005–1.030)
Urobilinogen, UA: 0.2 mg/dL (ref 0.0–1.0)

## 2014-09-19 LAB — BASIC METABOLIC PANEL
ANION GAP: 11 (ref 5–15)
BUN: 16 mg/dL (ref 6–20)
CALCIUM: 9.6 mg/dL (ref 8.9–10.3)
CHLORIDE: 106 mmol/L (ref 101–111)
CO2: 25 mmol/L (ref 22–32)
CREATININE: 0.96 mg/dL (ref 0.44–1.00)
GFR calc Af Amer: >60 mL/min (ref >60)
GFR calc non Af Amer: 56 mL/min — ABNORMAL LOW (ref >60)
Glucose, Bld: 134 mg/dL — ABNORMAL HIGH (ref 65–99)
Potassium: 3.7 mmol/L (ref 3.5–5.1)
Sodium: 142 mmol/L (ref 135–145)

## 2014-09-19 LAB — CBC
HCT: 42.7 % (ref 36.0–46.0)
HEMOGLOBIN: 14.5 g/dL (ref 12.0–15.0)
MCH: 32.7 pg (ref 26.0–34.0)
MCHC: 34 g/dL (ref 30.0–36.0)
MCV: 96.2 fL (ref 78.0–100.0)
PLATELETS: 352 10*3/uL (ref 150–400)
RBC: 4.44 MIL/uL (ref 3.87–5.11)
RDW: 13.7 % (ref 11.5–15.5)
WBC: 8.7 10*3/uL (ref 4.0–10.5)

## 2014-09-19 LAB — I-STAT TROPONIN, ED: Troponin i, poc: 0.01 ng/mL (ref 0.00–0.08)

## 2014-09-19 LAB — URINE MICROSCOPIC-ADD ON

## 2014-09-19 LAB — BRAIN NATRIURETIC PEPTIDE: B Natriuretic Peptide: 72.6 pg/mL (ref 0.0–100.0)

## 2014-09-19 MED ORDER — ASPIRIN 81 MG PO CHEW
324.0000 mg | CHEWABLE_TABLET | Freq: Once | ORAL | Status: AC
  Administered 2014-09-19: 324 mg via ORAL
  Filled 2014-09-19: qty 4

## 2014-09-19 NOTE — ED Notes (Signed)
Pt ambulated to the bathroom with stand by assistance.

## 2014-09-19 NOTE — ED Provider Notes (Signed)
CSN: [id number]     Arrival date & time 09/19/14  2212 History  This chart was scribed for Julianne Rice, MD by Irene Pap, ED Scribe. This patient was seen in room B19C/B19C and patient care was started at 11:09 PM.   Chief Complaint  Patient presents with  . Chest Pain  . Fatigue   Patient is a 79 y.o. female presenting with chest pain. The history is provided by the patient. No language interpreter was used.  Chest Pain Associated symptoms: fatigue, nausea and weakness (generalized)   Associated symptoms: no abdominal pain, no back pain, no cough, no dizziness, no fever, no headache, no numbness, no palpitations, no shortness of breath and not vomiting     HPI Comments: [name] [name] is a 79 y.o. Female with a history of CAD who presents to the Emergency Department complaining of left sided episodic chest pain onset this afternoon. She states that nothing makes the pain better or worse. She reports associated nausea. She states that she currently does not have the pain. She reports that her chest is tender upon palpation. She states that she takes baby aspirin every day and took a dose today. She denies taking anything else for the pain. Her husband states that he told her PCP on Monday that she has not been eating very much and has had decreased energy. He states that it has continued to today. Patient reports having a stent for a blockage in her heart. She states that she is followed by Dr. Algernon Huxley, her cardiologist.  Past Medical History  Diagnosis Date  . CAD (coronary artery disease)     LHC 2003 with 40% pLAD, 30% mLAD, 100% D1 (moderate vessel), 100%  D2 (small vessel), 95% mid small OM2.  Pt had PTCA to D1;  D2 and OM2 small vessels and tx medically;  Last Myoview (2012): anterior infarct seen with scar, no ischemia. EF normal. Pt managed medically;  Lexiscan Myoview (12/14):  Low risk; ant scar with very mild peri-infarct ischemia; EF 55% with ant AK   . Recurrent aspiration  bronchitis/pneumonia     PFTs 12/09 FVC 97%, FEV1 101%, ratio 77%, TLC 109%. No significant obstruction or restriction.   . Hypertension   . Hyperlipidemia   . Esophageal stricture   . Gastritis   . GERD (gastroesophageal reflux disease)   . Asthma   . Blood transfusion   . Cough   . Wheezing   . Weakness   . Trouble swallowing   . Abdominal pain   . Hiatal hernia   . Shortness of breath   . Anxiety   . TIA (transient ischemic attack)   . BRONCHITIS, ACUTE 07/25/2007         . DIVERTICULOSIS, COLON 08/28/2008    Qualifier: Diagnosis of  By: [name], [name], [name]    . IRON DEFICIENCY ANEMIA, HX OF 07/24/2007    Qualifier: Diagnosis of  By: Tilden Dome    . Chronic nausea   . Chronic back pain   . PONV (postoperative nausea and vomiting)    Past Surgical History  Procedure Laterality Date  . Back surgery    . Bladder repair    . Hemorrhoid surgery    . Cholecystectomy      Patient unsure of date.  [name] Appendectomy      patient unsure of date  . Total abdominal hysterectomy  1975    partial  . Coronary angioplasty  11/16/2001  . Eye surgery  11/2000  bilateral cataracts with lens implant  . Hiatal hernia repair  06/03/2011    Procedure: LAPAROSCOPIC REPAIR OF HIATAL HERNIA;  Surgeon: Adin Hector, MD;  Location: [facility];  Service: General;  Laterality: N/A;  . Laparoscopic nissen fundoplication  2/75/1700    Procedure: LAPAROSCOPIC NISSEN FUNDOPLICATION;  Surgeon: Adin Hector, MD;  Location: [facility];  Service: General;  Laterality: N/A;  . Lumbar laminectomy/decompression microdiscectomy Right 06/26/2012    Procedure: LUMBAR LAMINECTOMY/DECOMPRESSION MICRODISCECTOMY;  Surgeon: Jessy Oto, MD;  Location: [place];  Service: Orthopedics;  Laterality: Right;  RIGHT L4-5 MICRODISCECTOMY  . Esophagogastroduodenoscopy N/A 03/15/2013    Procedure: ESOPHAGOGASTRODUODENOSCOPY (EGD);  Surgeon: Jerene Bears, MD;  Location: [facility];  Service: Endoscopy;  Laterality: N/A;  .  Esophageal manometry N/A 03/25/2013    Procedure: ESOPHAGEAL MANOMETRY (EM);  Surgeon: Inda Castle, MD;  Location[facility];  Service: Endoscopy;  Laterality: N/A;  . Hiatal hernia repair N/A 04/24/2013    Procedure: LAPAROSCOPIC  REPAIR RECURRENT PARASOPHAGEAL HIATAL HERNIA WITH FUNDOPLICATION;  Surgeon: Adin Hector, MD;  Location: [facility];  Service: General;  Laterality: N/A;  . Insertion of mesh N/A 04/24/2013    Procedure: INSERTION OF MESH;  Surgeon: Adin Hector, MD;  Location: [facility];  Service: General;  Laterality: N/A;  . Laparoscopic lysis of adhesions N/A 04/24/2013    Procedure: LAPAROSCOPIC LYSIS OF ADHESIONS;  Surgeon: Adin Hector, MD;  Location: [facility];  Service: General;  Laterality: N/A;  . Fixation kyphoplasty lumbar spine  X 2    "L3-4"   Family History  Problem Relation Age of Onset  . Cancer Mother     colon   . Colon cancer Mother 38  . Heart disease Father 63    heart attack   History  Substance Use Topics  . Smoking status: Never Smoker   . Smokeless tobacco: Never Used  . Alcohol Use: No   OB History    No data available     Review of Systems  Constitutional: Positive for activity change, appetite change and fatigue. Negative for fever and chills.  Respiratory: Negative for cough and shortness of breath.   Cardiovascular: Positive for chest pain. Negative for palpitations and leg swelling.  Gastrointestinal: Positive for nausea. Negative for vomiting, abdominal pain and diarrhea.  Musculoskeletal: Negative for back pain.  Skin: Negative for rash and wound.  Neurological: Positive for weakness (generalized). Negative for dizziness, light-headedness, numbness and headaches.  All other systems reviewed and are negative.  Allergies  Aspartame; Daliresp; Levofloxacin; Shellfish allergy; Tramadol; Atorvastatin; Ace inhibitors; Biaxin; Erythromycin; and Hydrocodone  Home Medications   Prior to Admission medications   Medication Sig  Start Date End Date Taking? Authorizing Provider  acetaminophen (TYLENOL) 500 MG tablet Take 500 mg by mouth every 6 (six) hours as needed for moderate pain.   Yes Historical Provider, MD  Aclidinium Bromide (TUDORZA PRESSAIR) 400 MCG/ACT AEPB Inhale 1 puff into the lungs 2 (two) times daily as needed (wheezing).    Yes Historical Provider, MD  albuterol (PROVENTIL HFA;VENTOLIN HFA) 108 (90 BASE) MCG/ACT inhaler Inhale 1-2 puffs into the lungs every 6 (six) hours as needed for wheezing or shortness of breath.   Yes Historical Provider, MD  ALPRAZolam Duanne Moron) 0.5 MG tablet Take 1 tablet (0.5 mg total) by mouth 3 (three) times daily as needed for sleep or anxiety. 03/28/14  Yes [name], MD  amLODipine (NORVASC) 10 MG tablet Take 5 mg by mouth at bedtime. For  blood pressure   TAKE HALF OF 10MG  TAB   Yes Historical Provider, MD  aspirin EC 81 MG tablet Take 1 tablet (81 mg total) by mouth. 09/04/14  Yes Larey Dresser, MD  Cholecalciferol (VITAMIN D3) 2000 UNITS TABS Take 6,000 Units by mouth daily.    Yes Historical Provider, MD  feeding supplement, GLUCERNA SHAKE, (GLUCERNA SHAKE) LIQD Take 237 mLs by mouth 3 (three) times daily between meals. 12/05/13  Yes Shanker Kristeen Mans, MD  gabapentin (NEURONTIN) 100 MG capsule Take 1 capsule (100 mg total) by mouth 3 (three) times daily. 09/16/14 03/19/15 Yes [name], MD  ipratropium-albuterol (DUONEB) 0.5-2.5 (3) MG/3ML SOLN USE ONE VIAL IN NEBULIZER 4 TIMES DAILY Patient taking differently: Inhale 3 mLs into the lungs every 6 (six) hours as needed. For shortness of breath 02/26/14  Yes [name], MD  Linaclotide [facility]) 145 MCG CAPS capsule Take 1 capsule (145 mcg total) by mouth daily. 03/27/14  Yes Inda Castle, MD  losartan (COZAAR) 100 MG tablet TAKE ONE TABLET BY MOUTH ONCE DAILY (DISCONTINUE  BENICAR) 07/01/14  Yes [name], MD  metoprolol (LOPRESSOR) 50 MG tablet TAKE ONE TABLET BY MOUTH TWICE DAILY FOR BLOOD  PRESSURE Patient taking differently: Take one half tablet qhs 02/27/14  Yes [name], MD  mometasone-formoterol [facility]) 200-5 MCG/ACT AERO Inhale 2 puffs into the lungs 2 (two) times daily. 07/03/13  Yes Melissa Smith, PA-C  nitroGLYCERIN (NITROSTAT) 0.4 MG SL tablet Place 1 tablet (0.4 mg total) under the tongue every 5 (five) minutes as needed for chest pain. 09/04/14  Yes Larey Dresser, MD  OVER THE COUNTER MEDICATION Take 1,000 mg by mouth 2 (two) times daily. Coral Calcium 1000mg    Yes Historical Provider, MD  OVER THE COUNTER MEDICATION Take 400 mg by mouth 2 (two) times daily. Magnesium Carbonate 400mg    Yes Historical Provider, MD  polyethylene glycol (MIRALAX / GLYCOLAX) packet Take 17 g by mouth 2 (two) times daily.   Yes Historical Provider, MD  pravastatin (PRAVACHOL) 40 MG tablet Take 40 mg by mouth daily.   Yes Historical Provider, MD  prochlorperazine (COMPAZINE) 5 MG tablet Take 1 capsule 3 x day before meals only if needed for Nausea 09/16/14  Yes [name], MD  senna (SENOKOT) 8.6 MG TABS tablet Take 1 tablet by mouth 2 (two) times daily.  12/05/13  Yes Shanker Kristeen Mans, MD  sucralfate (CARAFATE) 1 G tablet Take 1 tablet (1 g total) by mouth 4 (four) times daily -  with meals and at bedtime. 08/30/14  Yes Blanchie Dessert, MD  ondansetron (ZOFRAN ODT) 4 MG disintegrating tablet Take 1 tablet (4 mg total) by mouth every 8 (eight) hours as needed for nausea or vomiting. Patient not taking: Reported on 09/19/2014 04/23/14   Carlisle Cater, PA-C   BP 144/94 mmHg  Pulse 73  Temp(Src) 98.1 F (36.7 C) (Oral)  Resp 16  Ht 5\' 3"  (1.6 m)  Wt 133 lb 4.8 oz (60.464 kg)  BMI 23.62 kg/m2  SpO2 98% Physical Exam  Constitutional: She is oriented to person, place, and time. She appears well-developed and well-nourished. No distress.  HENT:  Head: Normocephalic and atraumatic.  Mouth/Throat: Oropharynx is clear and moist.  Eyes: EOM are normal. Pupils are equal, round, and  reactive to light.  Neck: Normal range of motion. Neck supple.  Cardiovascular: Normal rate and regular rhythm.   Pulmonary/Chest: Effort normal and breath sounds normal. No respiratory distress. She has no wheezes. She has no  rales. She exhibits tenderness (mild tenderness with palpation of the left chest.).  Abdominal: Soft. Bowel sounds are normal. She exhibits no distension and no mass. There is tenderness (mild epigastric tenderness with palpation.). There is no rebound and no guarding.  Musculoskeletal: Normal range of motion. She exhibits no edema or tenderness.  Neurological: She is alert and oriented to person, place, and time.  5/5 motor in all extremities. Sensation is fully intact.  Skin: Skin is warm and dry. No rash noted. No erythema.  Psychiatric: Her behavior is normal.  Flat affect, Depressed mood.  Nursing note and vitals reviewed.   ED Course  Procedures (including critical care time) DIAGNOSTIC STUDIES: Oxygen Saturation is 98% on room air, normal by my interpretation.    COORDINATION OF CARE: 11:14 PM-Discussed treatment plan which includes aspirin with pt at bedside and pt agreed to plan.   Labs Review Labs Reviewed  BASIC METABOLIC PANEL - Abnormal; Notable for the following:    Glucose, Bld 134 (*)    GFR calc non Af Amer 56 (*)    All other components within normal limits  URINALYSIS, ROUTINE W REFLEX MICROSCOPIC - Abnormal; Notable for the following:    APPearance CLOUDY (*)    Leukocytes, UA SMALL (*)    All other components within normal limits  URINE MICROSCOPIC-ADD ON - Abnormal; Notable for the following:    Casts HYALINE CASTS (*)    All other components within normal limits  CBC  BRAIN NATRIURETIC PEPTIDE  I-STAT TROPOININ, ED    Imaging Review Dg Chest Port 1 View  09/19/2014   CLINICAL DATA:  Chest pain and fatigue.  EXAM: PORTABLE CHEST - 1 VIEW  COMPARISON:  08/30/2014  FINDINGS: The heart size and mediastinal contours are within normal  limits. Both lungs are clear. The visualized skeletal structures are unremarkable.  IMPRESSION: No active disease.   Electronically Signed   By: Lucienne Capers M.D.   On: 09/19/2014 23:11     EKG Interpretation   Date/Time:  Friday Sep 19 2014 22:17:42 EDT Ventricular Rate:  92 PR Interval:  130 QRS Duration: 82 QT Interval:  354 QTC Calculation: 437 R Axis:   -30 Text Interpretation:  Normal sinus rhythm Left axis deviation Septal  infarct , age undetermined Possible Lateral infarct , age undetermined  Abnormal ECG When compared with ECG of 08/30/2014, No significant change  was found Confirmed by [facility]  MD, Mckay Tegtmeyer ([id number]) on 09/19/2014 10:37:29 PM      MDM   Final diagnoses:  Chest pain, unspecified chest pain type    I personally performed the services described in this documentation, which was scribed in my presence. The recorded information has been reviewed and is accurate.   Discussed with cardiology who recommends admission for rule out to medical service. Patient remained chest pain-free at this point. Vital signs stable.  Discussed with hospitalist and will admit to telemetry observation bed.  Julianne Rice, MD 09/20/14 [phone]

## 2014-09-19 NOTE — ED Notes (Signed)
Pt c/o chest pain that comes and goes that started around 2pm. Pt states that she has been really weak and has lost her appetite.

## 2014-09-20 ENCOUNTER — Encounter: Payer: Self-pay | Admitting: *Deleted

## 2014-09-20 ENCOUNTER — Observation Stay (HOSPITAL_COMMUNITY): Payer: Medicare Other

## 2014-09-20 ENCOUNTER — Encounter (HOSPITAL_COMMUNITY): Payer: Self-pay | Admitting: Internal Medicine

## 2014-09-20 DIAGNOSIS — I10 Essential (primary) hypertension: Secondary | ICD-10-CM

## 2014-09-20 DIAGNOSIS — I209 Angina pectoris, unspecified: Secondary | ICD-10-CM | POA: Diagnosis not present

## 2014-09-20 DIAGNOSIS — E785 Hyperlipidemia, unspecified: Secondary | ICD-10-CM

## 2014-09-20 DIAGNOSIS — R739 Hyperglycemia, unspecified: Secondary | ICD-10-CM

## 2014-09-20 DIAGNOSIS — I2510 Atherosclerotic heart disease of native coronary artery without angina pectoris: Secondary | ICD-10-CM | POA: Insufficient documentation

## 2014-09-20 DIAGNOSIS — I2583 Coronary atherosclerosis due to lipid rich plaque: Secondary | ICD-10-CM

## 2014-09-20 DIAGNOSIS — R079 Chest pain, unspecified: Secondary | ICD-10-CM

## 2014-09-20 LAB — CBC
HCT: 40.9 % (ref 36.0–46.0)
Hemoglobin: 13.8 g/dL (ref 12.0–15.0)
MCH: 32.5 pg (ref 26.0–34.0)
MCHC: 33.7 g/dL (ref 30.0–36.0)
MCV: 96.2 fL (ref 78.0–100.0)
Platelets: 331 10*3/uL (ref 150–400)
RBC: 4.25 MIL/uL (ref 3.87–5.11)
RDW: 13.9 % (ref 11.5–15.5)
WBC: 5.8 10*3/uL (ref 4.0–10.5)

## 2014-09-20 LAB — COMPREHENSIVE METABOLIC PANEL
ALBUMIN: 3.7 g/dL (ref 3.5–5.0)
ALK PHOS: 59 U/L (ref 38–126)
ALT: 47 U/L (ref 14–54)
AST: 20 U/L (ref 15–41)
Anion gap: 11 (ref 5–15)
BUN: 11 mg/dL (ref 6–20)
CO2: 23 mmol/L (ref 22–32)
Calcium: 9 mg/dL (ref 8.9–10.3)
Chloride: 106 mmol/L (ref 101–111)
Creatinine, Ser: 0.84 mg/dL (ref 0.44–1.00)
GFR calc non Af Amer: >60 mL/min (ref >60)
Glucose, Bld: 122 mg/dL — ABNORMAL HIGH (ref 65–99)
Potassium: 3.2 mmol/L — ABNORMAL LOW (ref 3.5–5.1)
Sodium: 140 mmol/L (ref 135–145)
TOTAL PROTEIN: 6.4 g/dL — AB (ref 6.5–8.1)
Total Bilirubin: 0.5 mg/dL (ref 0.3–1.2)

## 2014-09-20 LAB — NM MYOCAR MULTI W/SPECT W/WALL MOTION / EF
CHL CUP NUCLEAR SDS: 3
CHL CUP NUCLEAR SSS: 18
LV dias vol: 73 mL
LV sys vol: 27 mL
NUC STRESS EF: 63 %
NUC STRESS TID: 1.01
RATE: 0.3
SRS: 15

## 2014-09-20 LAB — LIPID PANEL
CHOL/HDL RATIO: 2 ratio
Cholesterol: 125 mg/dL (ref 0–200)
HDL: 62 mg/dL (ref >40)
LDL Cholesterol: 55 mg/dL (ref 0–99)
TRIGLYCERIDES: 38 mg/dL (ref <150)
VLDL: 8 mg/dL (ref 0–40)

## 2014-09-20 LAB — TROPONIN I: Troponin I: <0.03 ng/mL (ref <0.031)

## 2014-09-20 MED ORDER — AMLODIPINE BESYLATE 10 MG PO TABS
10.0000 mg | ORAL_TABLET | Freq: Every day | ORAL | Status: DC
  Filled 2014-09-20: qty 1

## 2014-09-20 MED ORDER — GABAPENTIN 100 MG PO CAPS
100.0000 mg | ORAL_CAPSULE | Freq: Three times a day (TID) | ORAL | Status: DC
  Administered 2014-09-20: 100 mg via ORAL
  Filled 2014-09-20 (×3): qty 1

## 2014-09-20 MED ORDER — IPRATROPIUM-ALBUTEROL 0.5-2.5 (3) MG/3ML IN SOLN: 3.0000 mL | Freq: Four times a day (QID) | RESPIRATORY_TRACT | Status: DC | PRN

## 2014-09-20 MED ORDER — AMLODIPINE BESYLATE 5 MG PO TABS: 5.0000 mg | ORAL_TABLET | Freq: Every day | ORAL | Status: DC

## 2014-09-20 MED ORDER — SUCRALFATE 1 G PO TABS
1.0000 g | ORAL_TABLET | Freq: Three times a day (TID) | ORAL | Status: DC
  Administered 2014-09-20 (×2): 1 g via ORAL
  Filled 2014-09-20 (×5): qty 1

## 2014-09-20 MED ORDER — TECHNETIUM TC 99M SESTAMIBI GENERIC - CARDIOLITE
10.0000 | Freq: Once | INTRAVENOUS | Status: AC | PRN
  Administered 2014-09-20: 10 via INTRAVENOUS

## 2014-09-20 MED ORDER — POLYETHYLENE GLYCOL 3350 17 G PO PACK
17.0000 g | PACK | Freq: Two times a day (BID) | ORAL | Status: DC
  Filled 2014-09-20 (×2): qty 1

## 2014-09-20 MED ORDER — LINACLOTIDE 145 MCG PO CAPS
145.0000 ug | ORAL_CAPSULE | Freq: Every day | ORAL | Status: DC
  Administered 2014-09-20: 145 ug via ORAL
  Filled 2014-09-20: qty 1

## 2014-09-20 MED ORDER — SODIUM CHLORIDE 0.9 % IV SOLN
INTRAVENOUS | Status: AC
  Administered 2014-09-20: 01:00:00 via INTRAVENOUS

## 2014-09-20 MED ORDER — METOPROLOL TARTRATE 12.5 MG HALF TABLET
12.5000 mg | ORAL_TABLET | Freq: Every day | ORAL | Status: DC
  Filled 2014-09-20: qty 1

## 2014-09-20 MED ORDER — SODIUM CHLORIDE 0.9 % IJ SOLN
3.0000 mL | Freq: Two times a day (BID) | INTRAMUSCULAR | Status: DC
  Administered 2014-09-20: 3 mL via INTRAVENOUS

## 2014-09-20 MED ORDER — ALPRAZOLAM 0.5 MG PO TABS
0.5000 mg | ORAL_TABLET | Freq: Three times a day (TID) | ORAL | Status: DC | PRN
  Administered 2014-09-20: 0.5 mg via ORAL
  Filled 2014-09-20: qty 1

## 2014-09-20 MED ORDER — ALBUTEROL SULFATE (2.5 MG/3ML) 0.083% IN NEBU: 2.5000 mg | INHALATION_SOLUTION | Freq: Four times a day (QID) | RESPIRATORY_TRACT | Status: DC | PRN

## 2014-09-20 MED ORDER — SODIUM CHLORIDE 0.9 % IV SOLN: INTRAVENOUS | Status: AC

## 2014-09-20 MED ORDER — VITAMIN D3 25 MCG (1000 UNIT) PO TABS
6000.0000 [IU] | ORAL_TABLET | Freq: Every day | ORAL | Status: DC
  Administered 2014-09-20: 6000 [IU] via ORAL
  Filled 2014-09-20: qty 6

## 2014-09-20 MED ORDER — ONDANSETRON 4 MG PO TBDP
4.0000 mg | ORAL_TABLET | Freq: Three times a day (TID) | ORAL | Status: DC | PRN
  Administered 2014-09-20: 4 mg via ORAL
  Filled 2014-09-20 (×4): qty 1

## 2014-09-20 MED ORDER — MOMETASONE FURO-FORMOTEROL FUM 200-5 MCG/ACT IN AERO
2.0000 | INHALATION_SPRAY | Freq: Two times a day (BID) | RESPIRATORY_TRACT | Status: DC
  Filled 2014-09-20: qty 8.8

## 2014-09-20 MED ORDER — NITROGLYCERIN 0.4 MG SL SUBL: 0.4000 mg | SUBLINGUAL_TABLET | SUBLINGUAL | Status: DC | PRN

## 2014-09-20 MED ORDER — AMLODIPINE BESYLATE 10 MG PO TABS: 10.0000 mg | ORAL_TABLET | Freq: Every day | ORAL | Status: DC

## 2014-09-20 MED ORDER — REGADENOSON 0.4 MG/5ML IV SOLN
INTRAVENOUS | Status: AC
  Filled 2014-09-20: qty 5

## 2014-09-20 MED ORDER — PRAVASTATIN SODIUM 40 MG PO TABS
40.0000 mg | ORAL_TABLET | Freq: Every day | ORAL | Status: DC
  Filled 2014-09-20: qty 1

## 2014-09-20 MED ORDER — PROCHLORPERAZINE MALEATE 5 MG PO TABS
5.0000 mg | ORAL_TABLET | Freq: Three times a day (TID) | ORAL | Status: DC | PRN
  Filled 2014-09-20: qty 1

## 2014-09-20 MED ORDER — SENNA 8.6 MG PO TABS
1.0000 | ORAL_TABLET | Freq: Two times a day (BID) | ORAL | Status: DC
  Filled 2014-09-20 (×2): qty 1

## 2014-09-20 MED ORDER — ASPIRIN EC 81 MG PO TBEC
81.0000 mg | DELAYED_RELEASE_TABLET | Freq: Every day | ORAL | Status: DC
  Administered 2014-09-20: 81 mg via ORAL
  Filled 2014-09-20: qty 1

## 2014-09-20 MED ORDER — REGADENOSON 0.4 MG/5ML IV SOLN
0.4000 mg | Freq: Once | INTRAVENOUS | Status: AC
  Administered 2014-09-20: 0.4 mg via INTRAVENOUS
  Filled 2014-09-20: qty 5

## 2014-09-20 MED ORDER — ENOXAPARIN SODIUM 40 MG/0.4ML ~~LOC~~ SOLN
40.0000 mg | Freq: Every day | SUBCUTANEOUS | Status: DC
  Administered 2014-09-20: 40 mg via SUBCUTANEOUS
  Filled 2014-09-20: qty 0.4

## 2014-09-20 MED ORDER — TECHNETIUM TC 99M SESTAMIBI - CARDIOLITE
30.0000 | Freq: Once | INTRAVENOUS | Status: AC | PRN
  Administered 2014-09-20: 30 via INTRAVENOUS

## 2014-09-20 MED ORDER — GLUCERNA SHAKE PO LIQD
237.0000 mL | Freq: Three times a day (TID) | ORAL | Status: DC
  Administered 2014-09-20: 237 mL via ORAL

## 2014-09-20 MED ORDER — PANTOPRAZOLE SODIUM 40 MG PO TBEC: 40.0000 mg | DELAYED_RELEASE_TABLET | Freq: Every day | ORAL | Status: DC

## 2014-09-20 MED ORDER — AMLODIPINE BESYLATE 10 MG PO TABS: 5.0000 mg | ORAL_TABLET | Freq: Every day | ORAL | Status: DC

## 2014-09-20 MED ORDER — AMLODIPINE BESYLATE 5 MG PO TABS
5.0000 mg | ORAL_TABLET | Freq: Every day | ORAL | Status: DC
  Filled 2014-09-20 (×2): qty 1

## 2014-09-20 MED ORDER — LOSARTAN POTASSIUM 50 MG PO TABS
100.0000 mg | ORAL_TABLET | Freq: Every day | ORAL | Status: DC
  Administered 2014-09-20: 100 mg via ORAL
  Filled 2014-09-20: qty 2

## 2014-09-20 NOTE — Consult Note (Addendum)
CARDIOLOGY CONSULT NOTE   Patient ID: DEL WISEMAN MRN: [id number], DOB/AGE: 77-01-1936   Admit date: 09/19/2014 Date of Consult: 09/20/2014  Primary Physician: Alesia Richards, MD Primary Cardiologist: Dr Aundra Dubin  Reason for consult:  Chest pain  Problem List  Past Medical History  Diagnosis Date  . CAD (coronary artery disease)     LHC 2003 with 40% pLAD, 30% mLAD, 100% D1 (moderate vessel), 100%  D2 (small vessel), 95% mid small OM2.  Pt had PTCA to D1;  D2 and OM2 small vessels and tx medically;  Last Myoview (2012): anterior infarct seen with scar, no ischemia. EF normal. Pt managed medically;  Lexiscan Myoview (12/14):  Low risk; ant scar with very mild peri-infarct ischemia; EF 55% with ant AK   . Recurrent aspiration bronchitis/pneumonia     PFTs 12/09 FVC 97%, FEV1 101%, ratio 77%, TLC 109%. No significant obstruction or restriction.   . Hypertension   . Hyperlipidemia   . Esophageal stricture   . Gastritis   . GERD (gastroesophageal reflux disease)   . Asthma   . Blood transfusion   . Cough   . Wheezing   . Weakness   . Trouble swallowing   . Abdominal pain   . Hiatal hernia   . Shortness of breath   . Anxiety   . TIA (transient ischemic attack)   . BRONCHITIS, ACUTE 07/25/2007         . DIVERTICULOSIS, COLON 08/28/2008    Qualifier: Diagnosis of  By: [name], [name], [name]    . IRON DEFICIENCY ANEMIA, HX OF 07/24/2007    Qualifier: Diagnosis of  By: Tilden Dome    . Chronic nausea   . Chronic back pain   . PONV (postoperative nausea and vomiting)   . CAD (coronary artery disease)     Past Surgical History  Procedure Laterality Date  . Back surgery    . Bladder repair    . Hemorrhoid surgery    . Cholecystectomy      Patient unsure of date.  [name] Appendectomy      patient unsure of date  . Total abdominal hysterectomy  1975    partial  . Coronary angioplasty  11/16/2001  . Eye surgery  11/2000    bilateral cataracts with lens implant  .  Hiatal hernia repair  06/03/2011    Procedure: LAPAROSCOPIC REPAIR OF HIATAL HERNIA;  Surgeon: Adin Hector, MD;  Location: [facility];  Service: General;  Laterality: N/A;  . Laparoscopic nissen fundoplication  6/43/3295    Procedure: LAPAROSCOPIC NISSEN FUNDOPLICATION;  Surgeon: Adin Hector, MD;  Location: [facility];  Service: General;  Laterality: N/A;  . Lumbar laminectomy/decompression microdiscectomy Right 06/26/2012    Procedure: LUMBAR LAMINECTOMY/DECOMPRESSION MICRODISCECTOMY;  Surgeon: Jessy Oto, MD;  Location: [facility];  Service: Orthopedics;  Laterality: Right;  RIGHT L4-5 MICRODISCECTOMY  . Esophagogastroduodenoscopy N/A 03/15/2013    Procedure: ESOPHAGOGASTRODUODENOSCOPY (EGD);  Surgeon: Jerene Bears, MD;  Location: [facility];  Service: Endoscopy;  Laterality: N/A;  . Esophageal manometry N/A 03/25/2013    Procedure: ESOPHAGEAL MANOMETRY (EM);  Surgeon: Inda Castle, MD;  Location: [facility];  Service: Endoscopy;  Laterality: N/A;  . Hiatal hernia repair N/A 04/24/2013    Procedure: LAPAROSCOPIC  REPAIR RECURRENT PARASOPHAGEAL HIATAL HERNIA WITH FUNDOPLICATION;  Surgeon: Adin Hector, MD;  Location: [facility];  Service: General;  Laterality: N/A;  . Insertion of mesh N/A 04/24/2013    Procedure: INSERTION OF MESH;  Surgeon: Revonda Standard.  Gross, MD;  Location: [facility];  Service: General;  Laterality: N/A;  . Laparoscopic lysis of adhesions N/A 04/24/2013    Procedure: LAPAROSCOPIC LYSIS OF ADHESIONS;  Surgeon: Adin Hector, MD;  Location: [facility];  Service: General;  Laterality: N/A;  . Fixation kyphoplasty lumbar spine  X 2    "L3-4"     Allergies  Allergies  Allergen Reactions  . Aspartame Diarrhea and Nausea And Vomiting  . Daliresp [Roflumilast] Nausea And Vomiting  . Levofloxacin Nausea And Vomiting  . Shellfish Allergy Other (See Comments)    Flushing  . Tramadol Other (See Comments)    Blurred vision   . Atorvastatin Other (See Comments)    Muscle aches  .  Ace Inhibitors Cough    Per Dr Melvyn Novas pulmonology 2013  . Biaxin [Clarithromycin] Other (See Comments)    PATIENT CAN TOLERATE Z-PAK.  Is written on patient's paper chart.  . Erythromycin Other (See Comments)    PATIENT CAN TOLERATE Z-PAK.  It is written on patient's paper chart.  . Hydrocodone Nausea Only    HPI   79 yo female with hx of CAD (LHC 2003 with 40% pLAD, 30% mLAD, 100% D1 (moderate vessel), 100% D2 (small vessel), 95% mid small OM2. Pt had PTCA to D1; D2 and OM2 small vessels and tx medically; Last Myoview (2012): anterior infarct seen with scar, no ischemia. EF normal. Pt managed medically; Lexiscan Myoview (12/14): Low risk; ant scar with very mild peri-infarct ischemia; EF 55% with ant AK ), admitted yesterday c/o achiness on the left side of the chest pain, without radiation. Denies fever, chills, palp, sob, n/v, heartburn,diaphoresis, brbpr, black stool. Pt presented to ED for evaluation.  The patient continues to have chest tightness with fatigue and nausea today.   Past Medical History:  1. CAD: [place] 2003 with 40% pLAD, 30% mLAD, 100% D1 (moderate vessel), 100% D2 (small vessel), 95% mid small OM2. Pt had PTCA to D1. Last myoview (5/09): EF 66%, anteroseptal/apical infarct with minimal peri-infarct ischemia. No significant change from 11/06. Pt managed medically. Lexiscan Cardiolite (11/12) with apical anterior infarction, no ischemia, EF 68%. Lexiscan Cardiolite (12/14) with EF 55%, apical anterior infarct with mild peri-infarct ischemia. Echo (11/14) with EF 37-90%, PA systolic pressure 41 mmHg.  2. Recurrent bronchitis, suspect asthma: PFTs 12/09 FVC 97%, FEV1 101%, ratio 77%, TLC 109%. No significant obstruction or restriction. Never smoked.  3. HYPERTENSION  4. HYPERLIPIDEMIA  5. ESOPHAGEAL STRICTURE  6. DIVERTICULOSIS 7. HIATAL HERNIA WITH REFLUX: s/p Nissen fundoplication. She had repair of recurrent paraesophageal hernia in 12/14.  8. IRON  DEFICIENCY ANEMIA: EGD, colonoscopy, and capsule endoscopy in fall 2010 unrevealing for source. She then had mesenteric angiogram in 1/11 with no bleeding source found. ? small bowel AVMs.  9. Low back pain 10. MGUS: Negative bone marrow biopsy.  11. History of vertebral compression fracture  Inpatient Medications  . sodium chloride   Intravenous STAT  . amLODipine  5 mg Oral QHS  . aspirin EC  81 mg Oral Daily  . cholecalciferol  6,000 Units Oral Daily  . enoxaparin (LOVENOX) injection  40 mg Subcutaneous Daily  . feeding supplement (GLUCERNA SHAKE)  237 mL Oral TID BM  . gabapentin  100 mg Oral TID  . Linaclotide  145 mcg Oral Daily  . losartan  100 mg Oral Daily  . metoprolol  12.5 mg Oral QHS  . mometasone-formoterol  2 puff Inhalation BID  . polyethylene glycol  17 g Oral BID  .  pravastatin  40 mg Oral q1800  . regadenoson  0.4 mg Intravenous Once  . senna  1 tablet Oral BID  . sodium chloride  3 mL Intravenous Q12H  . sucralfate  1 g Oral TID WC & HS   Family History Family History  Problem Relation Age of Onset  . Cancer Mother     colon   . Colon cancer Mother 23  . Heart disease Father 30    heart attack    Social History History   Social History  . Marital Status: Married    Spouse Name: N/A  . Number of Children: N/A  . Years of Education: N/A   Occupational History  . Retired     worked at [employer]  . Smoking status: Never Smoker   . Smokeless tobacco: Never Used  . Alcohol Use: No  . Drug Use: No  . Sexual Activity: No   Other Topics Concern  . Not on file   Social History Narrative   Lives with husband.     Review of Systems  General:  No chills, fever, night sweats or weight changes.  Cardiovascular:  No chest pain, dyspnea on exertion, edema, orthopnea, palpitations, paroxysmal nocturnal dyspnea. Dermatological: No rash, lesions/masses Respiratory: No cough, dyspnea Urologic: No hematuria,  dysuria Abdominal:   No nausea, vomiting, diarrhea, bright red blood per rectum, melena, or hematemesis Neurologic:  No visual changes, wkns, changes in mental status. All other systems reviewed and are otherwise negative except as noted above.  Physical Exam  Blood pressure 186/79, pulse 65, temperature 98 F (36.7 C), temperature source Oral, resp. rate 15, height _0  (1.651 m), weight 131 lb 14.4 oz (59.829 kg), SpO2 99 %.  General: Pleasant, NAD Psych: Normal affect. Neuro: Alert and oriented X 3. Moves all extremities spontaneously. HEENT: Normal  Neck: Supple without bruits or JVD. Lungs:  Resp regular and unlabored, CTA. Heart: RRR no s3, s4, or murmurs. Abdomen: Soft, non-tender, non-distended, BS + x 4.  Extremities: No clubbing, cyanosis or edema. DP/PT/Radials 2+ and equal bilaterally.  Labs   Recent Labs  09/20/14 0344  TROPONINI <0.03   Lab Results  Component Value Date   WBC 8.7 09/19/2014   HGB 14.5 09/19/2014   HCT 42.7 09/19/2014   MCV 96.2 09/19/2014   PLT 352 09/19/2014    Recent Labs Lab 09/16/14 1100 09/19/14 2221  NA 143 142  K 4.2 3.7  CL 104 106  CO2 28 25  BUN 18 16  CREATININE 0.96 0.96  CALCIUM 10.5 9.6  PROT 6.7  --   BILITOT 0.7  --   ALKPHOS 76  --   ALT 125*  --   AST 21  --   GLUCOSE 95 134*   Lab Results  Component Value Date   CHOL 125 09/20/2014   HDL 62 09/20/2014   LDLCALC 55 09/20/2014   TRIG 38 09/20/2014   Lab Results  Component Value Date   DDIMER 1.43* 11/20/2013   Invalid input(s): POCBNP  Radiology/Studies  Dg Chest Port 1 View  09/19/2014   CLINICAL DATA:  Chest pain and fatigue.  EXAM: PORTABLE CHEST - 1 VIEW  COMPARISON:  08/30/2014  FINDINGS: The heart size and mediastinal contours are within normal limits. Both lungs are clear. The visualized skeletal structures are unremarkable.  IMPRESSION: No active disease.   Electronically Signed   By: Lucienne Capers M.D.   On: 09/19/2014 23:11  Echocardiogram - 09/02/2014 Left ventricle: The cavity size was normal. Systolic function was normal. The estimated ejection fraction was in the range of 55% to 60%. Wall motion was normal; there were no regional wall motion abnormalities. Doppler parameters are consistent with elevated ventricular end-diastolic filling pressure. - Left atrium: The atrium was mildly dilated. - Right atrium: The atrium was mildly dilated. - Atrial septum: There was increased thickness of the septum, consistent with lipomatous hypertrophy. No defect or patent foramen ovale was identified. - Pulmonary arteries: PA peak pressure: 35 mm Hg (S).  ECG: SR, nonspecific changes, unchanged from 04/2014    ASSESSMENT AND PLAN  1. Chest pain : CAD: Prior CAD with D1 PTCA 2003.The last stress test in 2014 with Low risk; ant scar with very mild peri-infarct ischemia; EF 55% with ant AK. Recent echo in 08/2014 shows normal LVEF, but elevated filling pressures EKG showed nsr at 90 with nonspecific ST-T wave abnormalities, unchanged from prior ECG, - ACS ruled out, troponin negative She is undergoing a stress test today.  - continue ASA, pravastatin -continue metoprolol to 12.5 mg at bedtime only (bradycardia) and losartan 100 mg po daily  2. Hyperlipidemia: Goal LDL < 70, at goal   3. HTN - elevated BP, increase amlodipine to 10 mg po daily  3. GERD - add PPIs   Signed, Dorothy Spark, MD, [facility] 09/20/2014, 9:40 AM

## 2014-09-20 NOTE — H&P (Signed)
[name] [name] is an 79 y.o. female.     Chief Complaint: chest pain HPI: 79 yo female with hx of CAD, apparently c/o achiness on the left side of the chest pain, without radiation.  Denies fever, chills, palp, sob, n/v, heartburn,diaphoresis, brbpr, black stool.  Pt presented to ED for evaluation.  Pt had CXR negative, EKG=> nsr at 90, borderline lad, nl int, poor r progression,  no st t changes c/w ischemia.  ED requested admission by medicine for evaluation of cp  Past Medical History  Diagnosis Date  . CAD (coronary artery disease)     LHC 2003 with 40% pLAD, 30% mLAD, 100% D1 (moderate vessel), 100%  D2 (small vessel), 95% mid small OM2.  Pt had PTCA to D1;  D2 and OM2 small vessels and tx medically;  Last Myoview (2012): anterior infarct seen with scar, no ischemia. EF normal. Pt managed medically;  Lexiscan Myoview (12/14):  Low risk; ant scar with very mild peri-infarct ischemia; EF 55% with ant AK   . Recurrent aspiration bronchitis/pneumonia     PFTs 12/09 FVC 97%, FEV1 101%, ratio 77%, TLC 109%. No significant obstruction or restriction.   . Hypertension   . Hyperlipidemia   . Esophageal stricture   . Gastritis   . GERD (gastroesophageal reflux disease)   . Asthma   . Blood transfusion   . Cough   . Wheezing   . Weakness   . Trouble swallowing   . Abdominal pain   . Hiatal hernia   . Shortness of breath   . Anxiety   . TIA (transient ischemic attack)   . BRONCHITIS, ACUTE 07/25/2007         . DIVERTICULOSIS, COLON 08/28/2008    Qualifier: Diagnosis of  By: [name], [name], [name]    . IRON DEFICIENCY ANEMIA, HX OF 07/24/2007    Qualifier: Diagnosis of  By: Tilden Dome    . Chronic nausea   . Chronic back pain   . PONV (postoperative nausea and vomiting)   . CAD (coronary artery disease)     Past Surgical History  Procedure Laterality Date  . Back surgery    . Bladder repair    . Hemorrhoid surgery    . Cholecystectomy      Patient unsure of date.  [name] Appendectomy       patient unsure of date  . Total abdominal hysterectomy  1975    partial  . Coronary angioplasty  11/16/2001  . Eye surgery  11/2000    bilateral cataracts with lens implant  . Hiatal hernia repair  06/03/2011    Procedure: LAPAROSCOPIC REPAIR OF HIATAL HERNIA;  Surgeon: Adin Hector, MD;  Location: [facility];  Service: General;  Laterality: N/A;  . Laparoscopic nissen fundoplication  0/31/5945    Procedure: LAPAROSCOPIC NISSEN FUNDOPLICATION;  Surgeon: Adin Hector, MD;  Location: [facility];  Service: General;  Laterality: N/A;  . Lumbar laminectomy/decompression microdiscectomy Right 06/26/2012    Procedure: LUMBAR LAMINECTOMY/DECOMPRESSION MICRODISCECTOMY;  Surgeon: Jessy Oto, MD;  Location: [place];  Service: Orthopedics;  Laterality: Right;  RIGHT L4-5 MICRODISCECTOMY  . Esophagogastroduodenoscopy N/A 03/15/2013    Procedure: ESOPHAGOGASTRODUODENOSCOPY (EGD);  Surgeon: Jerene Bears, MD;  Location: [facility];  Service: Endoscopy;  Laterality: N/A;  . Esophageal manometry N/A 03/25/2013    Procedure: ESOPHAGEAL MANOMETRY (EM);  Surgeon: Inda Castle, MD;  [facility];  Service: Endoscopy;  Laterality: N/A;  . Hiatal hernia repair N/A 04/24/2013  Procedure: LAPAROSCOPIC  REPAIR RECURRENT PARASOPHAGEAL HIATAL HERNIA WITH FUNDOPLICATION;  Surgeon: Adin Hector, MD;  Location: [facility];  Service: General;  Laterality: N/A;  . Insertion of mesh N/A 04/24/2013    Procedure: INSERTION OF MESH;  Surgeon: Adin Hector, MD;  Location: [facility];  Service: General;  Laterality: N/A;  . Laparoscopic lysis of adhesions N/A 04/24/2013    Procedure: LAPAROSCOPIC LYSIS OF ADHESIONS;  Surgeon: Adin Hector, MD;  Location: [facility];  Service: General;  Laterality: N/A;  . Fixation kyphoplasty lumbar spine  X 2    "L3-4"    Family History  Problem Relation Age of Onset  . Cancer Mother     colon   . Colon cancer Mother 39  . Heart disease Father 50    heart attack    Social History:  reports that she has never smoked. She has never used smokeless tobacco. She reports that she does not drink alcohol or use illicit drugs.  Allergies:  Allergies  Allergen Reactions  . Aspartame Diarrhea and Nausea And Vomiting  . Daliresp [Roflumilast] Nausea And Vomiting  . Levofloxacin Nausea And Vomiting  . Shellfish Allergy Other (See Comments)    Flushing  . Tramadol Other (See Comments)    Blurred vision   . Atorvastatin Other (See Comments)    Muscle aches  . Ace Inhibitors Cough    Per Dr Melvyn Novas pulmonology 2013  . Biaxin [Clarithromycin] Other (See Comments)    PATIENT CAN TOLERATE Z-PAK.  Is written on patient's paper chart.  . Erythromycin Other (See Comments)    PATIENT CAN TOLERATE Z-PAK.  It is written on patient's paper chart.  . Hydrocodone Nausea Only   Medications reviewed  (Not in a hospital admission)  Results for orders placed or performed during the hospital encounter of 09/19/14 (from the past 48 hour(s))  CBC     Status: None   Collection Time: 09/19/14 10:21 PM  Result Value Ref Range   WBC 8.7 4.0 - 10.5 K/uL   RBC 4.44 3.87 - 5.11 MIL/uL   Hemoglobin 14.5 12.0 - 15.0 g/dL   HCT 42.7 36.0 - 46.0 %   MCV 96.2 78.0 - 100.0 fL   MCH 32.7 26.0 - 34.0 pg   MCHC 34.0 30.0 - 36.0 g/dL   RDW 13.7 11.5 - 15.5 %   Platelets 352 150 - 400 K/uL  Basic metabolic panel     Status: Abnormal   Collection Time: 09/19/14 10:21 PM  Result Value Ref Range   Sodium 142 135 - 145 mmol/L   Potassium 3.7 3.5 - 5.1 mmol/L   Chloride 106 101 - 111 mmol/L   CO2 25 22 - 32 mmol/L   Glucose, Bld 134 (H) 65 - 99 mg/dL   BUN 16 6 - 20 mg/dL   Creatinine, Ser 0.96 0.44 - 1.00 mg/dL   Calcium 9.6 8.9 - 10.3 mg/dL   GFR calc non Af Amer 56 (L) >60 mL/min   GFR calc Af Amer >60 >60 mL/min    Comment: (NOTE) The eGFR has been calculated using the CKD EPI equation. This calculation has not been validated in all clinical situations. eGFR's persistently  <60 mL/min signify possible Chronic Kidney Disease.    Anion gap 11 5 - 15  BNP (order ONLY if patient complains of dyspnea/SOB AND you have documented it for THIS visit)     Status: None   Collection Time: 09/19/14 10:21 PM  Result Value Ref Range  B Natriuretic Peptide 72.6 0.0 - 100.0 pg/mL  Urinalysis, Routine w reflex microscopic     Status: Abnormal   Collection Time: 09/19/14 10:35 PM  Result Value Ref Range   Color, Urine YELLOW YELLOW   APPearance CLOUDY (A) CLEAR   Specific Gravity, Urine 1.015 1.005 - 1.030   pH 6.5 5.0 - 8.0   Glucose, UA NEGATIVE NEGATIVE mg/dL   Hgb urine dipstick NEGATIVE NEGATIVE   Bilirubin Urine NEGATIVE NEGATIVE   Ketones, ur NEGATIVE NEGATIVE mg/dL   Protein, ur NEGATIVE NEGATIVE mg/dL   Urobilinogen, UA 0.2 0.0 - 1.0 mg/dL   Nitrite NEGATIVE NEGATIVE   Leukocytes, UA SMALL (A) NEGATIVE  Urine microscopic-add on     Status: Abnormal   Collection Time: 09/19/14 10:35 PM  Result Value Ref Range   Squamous Epithelial / LPF RARE RARE   WBC, UA 7-10 <3 WBC/hpf   Bacteria, UA RARE RARE   Casts HYALINE CASTS (A) NEGATIVE  I-stat troponin, ED  (not at [facility])     Status: None   Collection Time: 09/19/14 10:49 PM  Result Value Ref Range   Troponin i, poc 0.01 0.00 - 0.08 ng/mL   Comment 3            Comment: Due to the release kinetics of cTnI, a negative result within the first hours of the onset of symptoms does not rule out myocardial infarction with certainty. If myocardial infarction is still suspected, repeat the test at appropriate intervals.    Dg Chest Port 1 View  09/19/2014   CLINICAL DATA:  Chest pain and fatigue.  EXAM: PORTABLE CHEST - 1 VIEW  COMPARISON:  08/30/2014  FINDINGS: The heart size and mediastinal contours are within normal limits. Both lungs are clear. The visualized skeletal structures are unremarkable.  IMPRESSION: No active disease.   Electronically Signed   By: Lucienne Capers M.D.   On: 09/19/2014 23:11     Review of Systems  Constitutional: Negative.   HENT: Negative.   Eyes: Negative.   Respiratory: Negative.   Cardiovascular: Positive for chest pain. Negative for palpitations, orthopnea, claudication, leg swelling and PND.  Gastrointestinal: Negative.  Negative for heartburn, nausea, vomiting, abdominal pain, diarrhea, constipation, blood in stool and melena.  Genitourinary: Negative.   Musculoskeletal: Negative.   Skin: Negative.   Neurological: Negative.   Endo/Heme/Allergies: Negative.   Psychiatric/Behavioral: Negative.     Blood pressure 136/69, pulse 62, temperature 98.1 F (36.7 C), temperature source Oral, resp. rate 18, height 5' 3" (1.6 m), weight 60.464 kg (133 lb 4.8 oz), SpO2 99 %. Physical Exam  Constitutional: She is oriented to person, place, and time. She appears well-developed and well-nourished.  HENT:  Head: Normocephalic and atraumatic.  Mouth/Throat: No oropharyngeal exudate.  Eyes: Conjunctivae and EOM are normal. Pupils are equal, round, and reactive to light. No scleral icterus.  Neck: Normal range of motion. Neck supple. No JVD present. No tracheal deviation present. No thyromegaly present.  Cardiovascular: Normal rate and regular rhythm.  Exam reveals no gallop and no friction rub.   No murmur heard. Respiratory: Effort normal and breath sounds normal. No respiratory distress. She has no wheezes. She has no rales.  GI: Soft. Bowel sounds are normal. She exhibits no distension. There is no tenderness. There is no rebound and no guarding.  Musculoskeletal: Normal range of motion. She exhibits no edema or tenderness.  Lymphadenopathy:    She has no cervical adenopathy.  Neurological: She is alert and oriented to  person, place, and time. She has normal reflexes. She displays normal reflexes. No cranial nerve deficit. She exhibits normal muscle tone. Coordination normal.  Skin: Skin is warm and dry. No rash noted. No erythema. No pallor.  Psychiatric: She  has a normal mood and affect. Judgment and thought content normal.     Assessment/Plan Cp Tele Trop i q6h x3 Check lipid Cont aspirin, b blocker, statin NPO after MN Nuclear stress test in am.   Hyperglycemia Check hga1c  Uti rocephine 1 gm iv qday  DVT prophylaxis: scd , lovenox  Peony Barner 09/20/2014, 12:51 AM

## 2014-09-20 NOTE — Progress Notes (Signed)
Noted order to dc home. DC instructions given and patient verbalized understaning

## 2014-09-20 NOTE — Evaluation (Addendum)
Physical Therapy Evaluation Patient Details Name: [name] [name] MRN: [id number] DOB: May 24, 1935 Today's Date: 09/20/2014   History of Present Illness  Pt adm with chest pain. Acute coronary syndrome ruled out. PMH - CAD, DM, back surgery  Clinical Impression  Pt presents to PT with modified independent mobility but with complaints of lack energy and weakness. Husband reports pt doesn't eat enough and pt reports very poor appetite. Encouraged pt to increase her intake and to follow up with her primary care physician. Husband reports her physician has also recently stopped a medication that may have a side effect of weakness. Encouraged pt to get up frequently during the day to amb and to perform long arc quad and sitting marching exercises at home. With flat affect, decreased appetite, and decreased energy may need to be screened for depression. From PT standpoint pt ready for dc home with husband.    Follow Up Recommendations No PT follow up    Equipment Recommendations  None recommended by PT    Recommendations for Other Services       Precautions / Restrictions Precautions Precautions: None      Mobility  Bed Mobility Overal bed mobility: Modified Independent                Transfers Overall transfer level: Modified independent Equipment used: None                Ambulation/Gait Ambulation/Gait assistance: Modified independent (Device/Increase time) Ambulation Distance (Feet): 300 Feet Assistive device: None       General Gait Details: slow pace, no loss of balance, decr arm swing and trunk rotation.  Stairs            Wheelchair Mobility    Modified Rankin (Stroke Patients Only)       Balance Overall balance assessment: Needs assistance Sitting-balance support: No upper extremity supported;Feet supported Sitting balance-Leahy Scale: Normal     Standing balance support: No upper extremity supported;During functional activity Standing  balance-Leahy Scale: Good                               Pertinent Vitals/Pain      Home Living Family/patient expects to be discharged to:: Private residence Living Arrangements: Spouse/significant other Available Help at Discharge: Family;Available 24 hours/day Type of Home: House Home Access: Stairs to enter Entrance Stairs-Rails: Right Entrance Stairs-Number of Steps: 2 Home Layout: One level Home Equipment: Walker - 2 wheels;Walker - 4 wheels;Cane - single point      Prior Function Level of Independence: Independent with assistive device(s)         Comments: Uses cane or rolling walker at times.     Hand Dominance        Extremity/Trunk Assessment   Upper Extremity Assessment: Defer to OT evaluation           Lower Extremity Assessment: Overall WFL for tasks assessed         Communication   Communication: 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  Cognition Arousal/Alertness: Awake/alert Behavior During Therapy: Flat affect Overall Cognitive Status: Within Functional Limits for tasks assessed                      General Comments      Exercises        Assessment/Plan    PT Assessment Patent does not need any further PT services  PT Diagnosis Generalized weakness   PT Problem List  PT Treatment Interventions     PT Goals (Current goals can be found in the Care Plan section) Acute Rehab PT Goals PT Goal Formulation: All assessment and education complete, DC therapy    Frequency     Barriers to discharge        Co-evaluation               End of Session   Activity Tolerance: Patient tolerated treatment well Patient left: in bed;with family/visitor present (sitting EOB) Nurse Communication: Mobility status         Time: 3845-3646 PT Time Calculation (min) (ACUTE ONLY): 12 min   Charges:   PT Evaluation $Initial PT Evaluation Tier I: 1 Procedure     PT G Codes:        Ladajah Soltys 10/17/14, 1:53 PM  [facility]  PT [phone]

## 2014-09-20 NOTE — Progress Notes (Signed)
NM Stress test:   Defect 1: There is a medium defect present in the mid anterior and apical anterior location.  Findings consistent with prior myocardial infarction.  The left ventricular ejection fraction is normal (55-65%).  This is a low risk study.  Patient is ok to discharge from cardiology standpoint.

## 2014-09-20 NOTE — Progress Notes (Signed)
Lexiscan/Cardiolite competed without complications. Scintigraphy to follow. Will be read by Radiologist.

## 2014-09-20 NOTE — Progress Notes (Signed)
Informed by lab that blood drawn for sed rate not enough patient already left the hospital for home.

## 2014-09-20 NOTE — Discharge Summary (Addendum)
Physician Discharge Summary  [name] [name] MRN: [id number] DOB/AGE: [birth year] 79 y.o.  PCP: Alesia Richards, MD   Admit date: 09/19/2014 Discharge date: 09/20/2014  Discharge Diagnoses:     Active Problems:   Chest pain   Hyperglycemia   Hypertensive urgency   Hyperlipidemia   Coronary artery disease due to lipid rich plaque  Follow-up recommendations Follow-up with PCP in 3-5 days Follow-up EDC, CMP in 3-5 days     Medication List    TAKE these medications        acetaminophen 500 MG tablet  Commonly known as:  TYLENOL  Take 500 mg by mouth every 6 (six) hours as needed for moderate pain.     albuterol 108 (90 BASE) MCG/ACT inhaler  Commonly known as:  PROVENTIL HFA;VENTOLIN HFA  Inhale 1-2 puffs into the lungs every 6 (six) hours as needed for wheezing or shortness of breath.     ALPRAZolam 0.5 MG tablet  Commonly known as:  XANAX  Take 1 tablet (0.5 mg total) by mouth 3 (three) times daily as needed for sleep or anxiety.     amLODipine 10 MG tablet  Commonly known as:  NORVASC  Take 0.5 tablets (5 mg total) by mouth at bedtime. For blood pressure   TAKE HALF OF 10MG TAB     aspirin EC 81 MG tablet  Take 1 tablet (81 mg total) by mouth.     feeding supplement (GLUCERNA SHAKE) Liqd  Take 237 mLs by mouth 3 (three) times daily between meals.     gabapentin 100 MG capsule  Commonly known as:  NEURONTIN  Take 1 capsule (100 mg total) by mouth 3 (three) times daily.     ipratropium-albuterol 0.5-2.5 (3) MG/3ML Soln  Commonly known as:  DUONEB  USE ONE VIAL IN NEBULIZER 4 TIMES DAILY     Linaclotide 145 MCG Caps capsule  Commonly known as:  LINZESS  Take 1 capsule (145 mcg total) by mouth daily.     losartan 100 MG tablet  Commonly known as:  COZAAR  TAKE ONE TABLET BY MOUTH ONCE DAILY (DISCONTINUE  BENICAR)     metoprolol 50 MG tablet  Commonly known as:  LOPRESSOR  TAKE ONE TABLET BY MOUTH TWICE DAILY FOR BLOOD PRESSURE     mometasone-formoterol 200-5 MCG/ACT Aero  Commonly known as:  DULERA  Inhale 2 puffs into the lungs 2 (two) times daily.     nitroGLYCERIN 0.4 MG SL tablet  Commonly known as:  NITROSTAT  Place 1 tablet (0.4 mg total) under the tongue every 5 (five) minutes as needed for chest pain.     ondansetron 4 MG disintegrating tablet  Commonly known as:  ZOFRAN ODT  Take 1 tablet (4 mg total) by mouth every 8 (eight) hours as needed for nausea or vomiting.     OVER THE COUNTER MEDICATION  Take 1,000 mg by mouth 2 (two) times daily. Coral Calcium 1066m     OVER THE COUNTER MEDICATION  Take 400 mg by mouth 2 (two) times daily. Magnesium Carbonate 4029m    pantoprazole 40 MG tablet  Commonly known as:  PROTONIX  Take 1 tablet (40 mg total) by mouth daily.     polyethylene glycol packet  Commonly known as:  MIRALAX / GLYCOLAX  Take 17 g by mouth 2 (two) times daily.     pravastatin 40 MG tablet  Commonly known as:  PRAVACHOL  Take 40 mg by mouth daily.  prochlorperazine 5 MG tablet  Commonly known as:  COMPAZINE  Take 1 capsule 3 x day before meals only if needed for Nausea     senna 8.6 MG Tabs tablet  Commonly known as:  SENOKOT  Take 1 tablet by mouth 2 (two) times daily.     sucralfate 1 G tablet  Commonly known as:  CARAFATE  Take 1 tablet (1 g total) by mouth 4 (four) times daily -  with meals and at bedtime.     TUDORZA PRESSAIR 400 MCG/ACT Aepb  Generic drug:  Aclidinium Bromide  Inhale 1 puff into the lungs 2 (two) times daily as needed (wheezing).     Vitamin D3 2000 UNITS Tabs  Take 6,000 Units by mouth daily.        Discharge Condition: Stable   Disposition: 01-Home or Self Care   Consults:  Cardiology    Significant Diagnostic Studies: Dg Chest 2 View  08/30/2014   CLINICAL DATA:  Initial evaluation for acute chest pain.  EXAM: CHEST  2 VIEW  COMPARISON:  Prior radiograph from 12/21/2013  FINDINGS: Transverse heart size at the upper limits of  normal, stable. Mediastinal silhouette within normal limits.  The lungs are normally inflated. Linear opacity at the peripheral left lung base most consistent with atelectasis or and/or scar. No airspace consolidation, pleural effusion, or pulmonary edema is identified. There is no pneumothorax.  No acute osseous abnormality identified. Sequelae of prior vertebral augmentation seen within the lower thoracic/upper lumbar spine.  IMPRESSION: No active cardiopulmonary disease.   Electronically Signed   By: Jeannine Boga M.D.   On: 08/30/2014 23:28   Dg Chest Port 1 View  09/19/2014   CLINICAL DATA:  Chest pain and fatigue.  EXAM: PORTABLE CHEST - 1 VIEW  COMPARISON:  08/30/2014  FINDINGS: The heart size and mediastinal contours are within normal limits. Both lungs are clear. The visualized skeletal structures are unremarkable.  IMPRESSION: No active disease.   Electronically Signed   By: Lucienne Capers M.D.   On: 09/19/2014 23:11      Microbiology: No results found for this or any previous visit (from the past 240 hour(s)).   Labs: Results for orders placed or performed during the hospital encounter of 09/19/14 (from the past 48 hour(s))  CBC     Status: None   Collection Time: 09/19/14 10:21 PM  Result Value Ref Range   WBC 8.7 4.0 - 10.5 K/uL   RBC 4.44 3.87 - 5.11 MIL/uL   Hemoglobin 14.5 12.0 - 15.0 g/dL   HCT 42.7 36.0 - 46.0 %   MCV 96.2 78.0 - 100.0 fL   MCH 32.7 26.0 - 34.0 pg   MCHC 34.0 30.0 - 36.0 g/dL   RDW 13.7 11.5 - 15.5 %   Platelets 352 150 - 400 K/uL  Basic metabolic panel     Status: Abnormal   Collection Time: 09/19/14 10:21 PM  Result Value Ref Range   Sodium 142 135 - 145 mmol/L   Potassium 3.7 3.5 - 5.1 mmol/L   Chloride 106 101 - 111 mmol/L   CO2 25 22 - 32 mmol/L   Glucose, Bld 134 (H) 65 - 99 mg/dL   BUN 16 6 - 20 mg/dL   Creatinine, Ser 0.96 0.44 - 1.00 mg/dL   Calcium 9.6 8.9 - 10.3 mg/dL   GFR calc non Af Amer 56 (L) >60 mL/min   GFR calc Af  Amer >60 >60 mL/min    Comment: (NOTE) The eGFR  has been calculated using the CKD EPI equation. This calculation has not been validated in all clinical situations. eGFR's persistently <60 mL/min signify possible Chronic Kidney Disease.    Anion gap 11 5 - 15  BNP (order ONLY if patient complains of dyspnea/SOB AND you have documented it for THIS visit)     Status: None   Collection Time: 09/19/14 10:21 PM  Result Value Ref Range   B Natriuretic Peptide 72.6 0.0 - 100.0 pg/mL  Urinalysis, Routine w reflex microscopic     Status: Abnormal   Collection Time: 09/19/14 10:35 PM  Result Value Ref Range   Color, Urine YELLOW YELLOW   APPearance CLOUDY (A) CLEAR   Specific Gravity, Urine 1.015 1.005 - 1.030   pH 6.5 5.0 - 8.0   Glucose, UA NEGATIVE NEGATIVE mg/dL   Hgb urine dipstick NEGATIVE NEGATIVE   Bilirubin Urine NEGATIVE NEGATIVE   Ketones, ur NEGATIVE NEGATIVE mg/dL   Protein, ur NEGATIVE NEGATIVE mg/dL   Urobilinogen, UA 0.2 0.0 - 1.0 mg/dL   Nitrite NEGATIVE NEGATIVE   Leukocytes, UA SMALL (A) NEGATIVE  Urine microscopic-add on     Status: Abnormal   Collection Time: 09/19/14 10:35 PM  Result Value Ref Range   Squamous Epithelial / LPF RARE RARE   WBC, UA 7-10 <3 WBC/hpf   Bacteria, UA RARE RARE   Casts HYALINE CASTS (A) NEGATIVE  I-stat troponin, ED  (not at [facility])     Status: None   Collection Time: 09/19/14 10:49 PM  Result Value Ref Range   Troponin i, poc 0.01 0.00 - 0.08 ng/mL   Comment 3            Comment: Due to the release kinetics of cTnI, a negative result within the first hours of the onset of symptoms does not rule out myocardial infarction with certainty. If myocardial infarction is still suspected, repeat the test at appropriate intervals.   Troponin I (q 6hr x 3)     Status: None   Collection Time: 09/20/14  3:44 AM  Result Value Ref Range   Troponin I <0.03 <0.031 ng/mL    Comment:        NO INDICATION OF MYOCARDIAL INJURY.   Lipid panel      Status: None   Collection Time: 09/20/14  3:44 AM  Result Value Ref Range   Cholesterol 125 0 - 200 mg/dL   Triglycerides 38 <150 mg/dL   HDL 62 >40 mg/dL   Total CHOL/HDL Ratio 2.0 RATIO   VLDL 8 0 - 40 mg/dL   LDL Cholesterol 55 0 - 99 mg/dL    Comment:        Total Cholesterol/HDL:CHD Risk Coronary Heart Disease Risk Table                     Men   Women  1/2 Average Risk   3.4   3.3  Average Risk       5.0   4.4  2 X Average Risk   9.6   7.1  3 X Average Risk  23.4   11.0        Use the calculated Patient Ratio above and the CHD Risk Table to determine the patient's CHD Risk.        ATP III CLASSIFICATION (LDL):  <100     mg/dL   Optimal  100-129  mg/dL   Near or Above  Optimal  130-159  mg/dL   Borderline  160-189  mg/dL   High  >190     mg/dL   Very High      HPI :79 y.o. Female with a history of CAD who presents to the Emergency Department complaining of left sided episodic chest pain onset this afternoon. She states that nothing makes the pain better or worse. She reports associated nausea. She states that she currently does not have the pain. She reports that her chest is tender upon palpation. She states that she takes baby aspirin every day and took a dose today. She denies taking anything else for the pain. Her husband states that he told her PCP on Monday that she has not been eating very much and has had decreased energy. He states that it has continued to today. Patient reports having a stent for a blockage in her heart. She states that she is followed by Dr. Algernon Huxley, her cardiologist.   HOSPITAL COURSE:     1. Chest pain : CAD: Prior CAD with D1 PTCA 2003.The last stress test in 2014 with Low risk; ant scar with very mild peri-infarct ischemia; EF 55% with ant AK. Recent echo in 08/2014 shows normal LVEF, but elevated filling pressures EKG showed nsr at 90 with nonspecific ST-T wave abnormalities, unchanged from prior ECG, - ACS ruled out,  troponin negative She is undergoing a stress test today.  - continue ASA, pravastatin -continue metoprolol to 12.5 mg at bedtime only (bradycardia) and losartan 100 mg po daily Overweight results of the stress test if negative patient will discharge home today   2. Hyperlipidemia: Goal LDL < 70, at goal   3. HTN - elevated BP, increased amlodipine to 10 mg po daily  3. GERD, hiatal hernia with Nissen fundoplication -  continue Carafate, start the patient on Protonix   4 . generalized weakness-will check ESR, CK, PT/OT eval, recent heart function was negative therefore will not repeat  5. History of MGUS-CBC stable at this point  Discharge Exam:    Blood pressure 138/70, pulse 100, temperature 98 F (36.7 C), temperature source Oral, resp. rate 15, height '5\' 5"'  (1.651 m), weight 59.829 kg (131 lb 14.4 oz), SpO2 99 %.  General: Pleasant, NAD Psych: Normal affect. Neuro: Alert and oriented X 3. Moves all extremities spontaneously. HEENT: Normal Neck: Supple without bruits or JVD. Lungs: Resp regular and unlabored, CTA. Heart: RRR no s3, s4, or murmurs. Abdomen: Soft, non-tender, non-distended, BS + x 4.  Extremities: No clubbing, cyanosis or edema. DP/PT/Radials 2+ and equal bilaterally.      SignedReyne Dumas 09/20/2014, 12:20 PM

## 2014-09-22 LAB — HEMOGLOBIN A1C
Hgb A1c MFr Bld: 5.9 % — ABNORMAL HIGH (ref 4.8–5.6)
Mean Plasma Glucose: 123 mg/dL

## 2014-09-24 DIAGNOSIS — M47817 Spondylosis without myelopathy or radiculopathy, lumbosacral region: Secondary | ICD-10-CM | POA: Diagnosis not present

## 2014-09-24 DIAGNOSIS — M79606 Pain in leg, unspecified: Secondary | ICD-10-CM | POA: Diagnosis not present

## 2014-09-24 DIAGNOSIS — G894 Chronic pain syndrome: Secondary | ICD-10-CM | POA: Diagnosis not present

## 2014-09-24 DIAGNOSIS — M545 Low back pain: Secondary | ICD-10-CM | POA: Diagnosis not present

## 2014-09-26 ENCOUNTER — Encounter: Payer: Self-pay | Admitting: Internal Medicine

## 2014-09-26 ENCOUNTER — Ambulatory Visit (INDEPENDENT_AMBULATORY_CARE_PROVIDER_SITE_OTHER): Payer: Medicare Other | Admitting: Internal Medicine

## 2014-09-26 VITALS — BP 122/68 | HR 64 | Temp 97.9°F | Resp 16 | Ht 63.75 in | Wt 133.2 lb

## 2014-09-26 DIAGNOSIS — R110 Nausea: Secondary | ICD-10-CM | POA: Diagnosis not present

## 2014-09-26 DIAGNOSIS — F329 Major depressive disorder, single episode, unspecified: Secondary | ICD-10-CM | POA: Diagnosis not present

## 2014-09-26 DIAGNOSIS — G894 Chronic pain syndrome: Secondary | ICD-10-CM | POA: Diagnosis not present

## 2014-09-26 DIAGNOSIS — I10 Essential (primary) hypertension: Secondary | ICD-10-CM | POA: Diagnosis not present

## 2014-09-26 DIAGNOSIS — R945 Abnormal results of liver function studies: Secondary | ICD-10-CM

## 2014-09-26 DIAGNOSIS — R7989 Other specified abnormal findings of blood chemistry: Secondary | ICD-10-CM | POA: Diagnosis not present

## 2014-09-26 DIAGNOSIS — F324 Major depressive disorder, single episode, in partial remission: Secondary | ICD-10-CM | POA: Insufficient documentation

## 2014-09-26 DIAGNOSIS — R799 Abnormal finding of blood chemistry, unspecified: Secondary | ICD-10-CM | POA: Diagnosis not present

## 2014-09-26 DIAGNOSIS — F32A Depression, unspecified: Secondary | ICD-10-CM

## 2014-09-26 LAB — HEPATIC FUNCTION PANEL
ALBUMIN: 4.2 g/dL (ref 3.5–5.2)
ALT: 48 U/L — ABNORMAL HIGH (ref 0–35)
AST: 31 U/L (ref 0–37)
Alkaline Phosphatase: 70 U/L (ref 39–117)
Bilirubin, Direct: 0.1 mg/dL (ref 0.0–0.3)
Indirect Bilirubin: 0.6 mg/dL (ref 0.2–1.2)
Total Bilirubin: 0.7 mg/dL (ref 0.2–1.2)
Total Protein: 6.9 g/dL (ref 6.0–8.3)

## 2014-09-26 MED ORDER — PROCHLORPERAZINE MALEATE 5 MG PO TABS: ORAL_TABLET | ORAL | Status: DC

## 2014-09-26 MED ORDER — DULOXETINE HCL 30 MG PO CPEP: ORAL_CAPSULE | ORAL | Status: DC

## 2014-09-26 NOTE — Patient Instructions (Signed)

## 2014-09-27 NOTE — Progress Notes (Signed)
Subjective:    Patient ID: [name] [name], female    DOB: 07-13-1935, 79 y.o.   MRN: [id number]  HPI 79 yo MWF returns for f/u from recent visit to evaluate lethargy hypersomnolence which was attributed to poly pharmacy and patient was given explicit instructions to decrease gabapentin from 300 to 100 mg tid, and compazine 10 mg to 5 mg tid and only if nauseated. Patient had also been on Vicodin and Alprazolam and she was advised very sparing judicious use. She is somewhat more alert today and does continue to c/o mid/LBP not significantly greater  than prior to tapering meds.    Long discussion today with patient's husband /caretaker wrt her chronic pain and consequent depression.   Medication Sig  . acetaminophen 500 MG tablet Take 500 mg by mouth every 6 (six) hours as needed for moderate pain.  . Aclidinium  (TUDORZA ) 400 MCG/ACT  Inhale 1 puff into the lungs 2 (two) times daily as needed (wheezing).   [name] albuterol  HFA inhaler Inhale 1-2 puffs into the lungs every 6 (six) hours as needed for wheezing or shortness of breath.  . ALPRAZolam 0.5 MG tablet Take 1 tablet (0.5 mg total) by mouth 3 (three) times daily as needed for sleep or anxiety.  [name] amLODipine  10 MG tablet Take 1 tablet (10 mg total) by mouth at bedtime.  [name] aspirin EC 81 MG tablet Take 1 tablet (81 mg total) by mouth.  [name] VITAMIN D3 2000 UNITS  Take 6,000 Units by mouth daily.   [name] GLUCERNA SHAKE  3  times daily between meals.  . gabapentin (NEURONTIN) 100 MG capsule Take 1 capsule (100 mg total) by mouth 3 (three) times daily.  [name] ipratropium-albuterol (DUONEB) 0.5-2.5 (3) MG/3ML   Inhale 3 mLs into the lungs every 6 (six) hours as needed  . losartan  100 MG tablet TAKE ONE TABLET BY MOUTH ONCE DAILY   . metoprolol  50 MG tablet  Take one half tablet qhs)  . mometasone-formoterol (DULERA) 200-5 Inhale 2 puffs into the lungs 2 (two) times daily.  . nitroGLYCERIN (NITROSTAT) 0.4 MG SL tablet Place 1 tablet (0.4 mg total) under  the tongue every 5 (five) minutes as needed for chest pain.  [name] ondansetron (ZOFRAN ODT) 4 MG disintegrating tablet Take 1 tablet (4 mg total) by mouth every 8 (eight) hours as needed for nausea or vomiting.  .  Coral Calcium 1000mg  Take 1,000 mg by mouth 2 (two) times daily.  . Magnesium Carbonate 400mg  Take 400 mg by mouth 2 (two) times daily.   . pantoprazole  40 MG tablet Take 1 tablet (40 mg total) by mouth daily.  [name] MIRALAX  Take 17 g by mouth 2 (two) times daily.  . pravastatin  40 MG tablet Take 40 mg by mouth daily.  [name] senna  8.6 MG TABS tablet Take 1 tablet by mouth 2 (two) times daily.   . sucralfate 1 G tablet Take 1 tablet (1 g total) by mouth 4 (four) times daily -  with meals and at bedtime.  . prochlorperazine (COMPAZINE) 5 MG tablet Take 1 capsule 3 x day before meals only if needed for Nausea   Allergies  Allergen Reactions  . Aspartame Diarrhea and Nausea And Vomiting  . Daliresp [Roflumilast] Nausea And Vomiting  . Levofloxacin Nausea And Vomiting  . Shellfish Allergy Other (See Comments)    Flushing  . Tramadol Other (See Comments)    Blurred vision   .  Atorvastatin Other (See Comments)    Muscle aches  . Ace Inhibitors Cough    Per Dr Melvyn Novas pulmonology 2013  . Biaxin [Clarithromycin] Other (See Comments)    PATIENT CAN TOLERATE Z-PAK.  Is written on patient's paper chart.  . Erythromycin Other (See Comments)    PATIENT CAN TOLERATE Z-PAK.  It is written on patient's paper chart.  . Hydrocodone Nausea Only   Past Medical History  Diagnosis Date  . CAD (coronary artery disease)     LHC 2003 with 40% pLAD, 30% mLAD, 100% D1 (moderate vessel), 100%  D2 (small vessel), 95% mid small OM2.  Pt had PTCA to D1;  D2 and OM2 small vessels and tx medically;  Last Myoview (2012): anterior infarct seen with scar, no ischemia. EF normal. Pt managed medically;  Lexiscan Myoview (12/14):  Low risk; ant scar with very mild peri-infarct ischemia; EF 55% with ant AK   . Recurrent  aspiration bronchitis/pneumonia     PFTs 12/09 FVC 97%, FEV1 101%, ratio 77%, TLC 109%. No significant obstruction or restriction.   . Hypertension   . Hyperlipidemia   . Esophageal stricture   . Gastritis   . GERD (gastroesophageal reflux disease)   . Asthma   . Blood transfusion   . Cough   . Wheezing   . Weakness   . Trouble swallowing   . Abdominal pain   . Hiatal hernia   . Shortness of breath   . Anxiety   . TIA (transient ischemic attack)   . BRONCHITIS, ACUTE 07/25/2007         . DIVERTICULOSIS, COLON 08/28/2008    Qualifier: Diagnosis of  By: [name], [name], [name]    . IRON DEFICIENCY ANEMIA, HX OF 07/24/2007    Qualifier: Diagnosis of  By: Tilden Dome    . Chronic nausea   . Chronic back pain   . PONV (postoperative nausea and vomiting)   . CAD (coronary artery disease)    Review of Systems   10 point systems review negative except as above.    Objective:   Physical Exam  BP 122/68 mmHg  Pulse 64  Temp(Src) 97.9 F (36.6 C)  Resp 16  Ht 5' 3.75" (1.619 m)  Wt 133 lb 3.2 oz (60.419 kg)  BMI 23.05 kg/m2  HEENT - Eac's patent. TM's Nl. EOM's full. PERRLA. NasoOroPharynx clear. Neck - supple. Nl Thyroid. Carotids 2+ & No bruits, nodes, JVD Chest - kyphosis. Decreased BS w/o Rales, rhonchi, wheezes. Cor - Nl HS. RRR w/o sig MGR. PP 1(+). No edema. Abd - No palpable organomegaly, masses or tenderness. BS nl. MS- FROM w/o deformities. Muscle power, tone and bulk Nl. Gait Nl. Neuro - No obvious Cr N abnormalities. Sensory, motor and Cerebellar functions appear Nl w/o focal abnormalities. Psyche - Mental status with flat affect.  .    Assessment & Plan:   1. Essential hypertension   2. Depression  - DULoxetine (CYMBALTA) 30 MG capsule; Take 3 capsules daily for mood & chronic pain  Dispense: 90 capsule; Refill: 5  3. Chronic pain syndrome  - Start DULoxetine (CYMBALTA) 30 MG capsule; Take 3 capsules daily for mood & chronic pain  Dispense: 90 capsule;  Refill: 5  - Stressed importance of avoiding over-medicating.   4. Nausea without vomiting  - prochlorperazine (COMPAZINE) 5 MG tablet; Take 1 capsule 3 x day before meals only if needed for Nausea  Dispense: 90 tablet; Refill: 5  5. Abnormal LFTs, hx/o  - Hepatic  function panel  - Discussed Meds/SE's. ROV 1 mon to assess response to Duloxetine.

## 2014-09-29 ENCOUNTER — Other Ambulatory Visit: Payer: Self-pay | Admitting: Internal Medicine

## 2014-09-30 ENCOUNTER — Other Ambulatory Visit: Payer: Self-pay

## 2014-09-30 DIAGNOSIS — G894 Chronic pain syndrome: Secondary | ICD-10-CM

## 2014-09-30 DIAGNOSIS — F32A Depression, unspecified: Secondary | ICD-10-CM

## 2014-09-30 DIAGNOSIS — F329 Major depressive disorder, single episode, unspecified: Secondary | ICD-10-CM

## 2014-09-30 MED ORDER — DULOXETINE HCL 30 MG PO CPEP: ORAL_CAPSULE | ORAL | Status: DC

## 2014-10-01 DIAGNOSIS — Z79899 Other long term (current) drug therapy: Secondary | ICD-10-CM | POA: Diagnosis not present

## 2014-10-01 DIAGNOSIS — G2581 Restless legs syndrome: Secondary | ICD-10-CM | POA: Diagnosis not present

## 2014-10-01 DIAGNOSIS — G894 Chronic pain syndrome: Secondary | ICD-10-CM | POA: Diagnosis not present

## 2014-10-01 DIAGNOSIS — M47817 Spondylosis without myelopathy or radiculopathy, lumbosacral region: Secondary | ICD-10-CM | POA: Diagnosis not present

## 2014-10-01 DIAGNOSIS — M79606 Pain in leg, unspecified: Secondary | ICD-10-CM | POA: Diagnosis not present

## 2014-10-01 DIAGNOSIS — M545 Low back pain: Secondary | ICD-10-CM | POA: Diagnosis not present

## 2014-10-08 ENCOUNTER — Telehealth: Payer: Self-pay | Admitting: *Deleted

## 2014-10-08 NOTE — Telephone Encounter (Signed)
[phone]) aware of Duloxetine 30 mg capsule TID approved until 05/09/2015.

## 2014-10-08 NOTE — Telephone Encounter (Signed)
Patient called and asked if she should stop the Hydrocodone.  Per Dr Melford Aase, he prefers she stop the med.  Also, patient was informed the Duloxetine has been approved.

## 2014-10-12 ENCOUNTER — Other Ambulatory Visit: Payer: Self-pay | Admitting: Internal Medicine

## 2014-10-13 ENCOUNTER — Telehealth: Payer: Self-pay | Admitting: *Deleted

## 2014-10-13 ENCOUNTER — Other Ambulatory Visit: Payer: Self-pay | Admitting: Internal Medicine

## 2014-10-13 MED ORDER — CITALOPRAM HYDROBROMIDE 20 MG PO TABS: ORAL_TABLET | ORAL | Status: DC

## 2014-10-13 NOTE — Telephone Encounter (Signed)
Patient called and states she is having suicidal thoughts and tremors sine starting Duloxetine.  Per Dr Melford Aase, stop the Duloxetine and he sent in an RX for Celexa.  Patient aware.

## 2014-10-15 ENCOUNTER — Telehealth: Payer: Self-pay

## 2014-10-15 ENCOUNTER — Other Ambulatory Visit: Payer: Self-pay | Admitting: Internal Medicine

## 2014-10-15 NOTE — Telephone Encounter (Signed)
Patient is having suicidal thoughts since starting Citalopram. Per Dr.McKeown, patient should stop Citalopram. Dr.McKeown does not think medication is causing suicidal thoughts, he thinks she is more than likely having withdrawal symptoms. Patient is advised to schedule an appointment with Dr.Plovsky and is also advised to go to [facility] ER if she is going to harm herself or others.

## 2014-10-22 DIAGNOSIS — M545 Low back pain: Secondary | ICD-10-CM | POA: Diagnosis not present

## 2014-10-23 DIAGNOSIS — M5431 Sciatica, right side: Secondary | ICD-10-CM | POA: Diagnosis not present

## 2014-10-28 ENCOUNTER — Ambulatory Visit: Payer: Self-pay | Admitting: Internal Medicine

## 2014-10-31 DIAGNOSIS — M170 Bilateral primary osteoarthritis of knee: Secondary | ICD-10-CM | POA: Diagnosis not present

## 2014-10-31 DIAGNOSIS — R6889 Other general symptoms and signs: Secondary | ICD-10-CM | POA: Diagnosis not present

## 2014-10-31 DIAGNOSIS — M810 Age-related osteoporosis without current pathological fracture: Secondary | ICD-10-CM | POA: Diagnosis not present

## 2014-10-31 DIAGNOSIS — M19041 Primary osteoarthritis, right hand: Secondary | ICD-10-CM | POA: Diagnosis not present

## 2014-11-04 ENCOUNTER — Other Ambulatory Visit (INDEPENDENT_AMBULATORY_CARE_PROVIDER_SITE_OTHER): Payer: Medicare Other | Admitting: *Deleted

## 2014-11-04 DIAGNOSIS — E782 Mixed hyperlipidemia: Secondary | ICD-10-CM

## 2014-11-04 DIAGNOSIS — R0602 Shortness of breath: Secondary | ICD-10-CM

## 2014-11-04 LAB — HEPATIC FUNCTION PANEL
ALT: 17 U/L (ref 0–35)
AST: 21 U/L (ref 0–37)
Albumin: 4.1 g/dL (ref 3.5–5.2)
Alkaline Phosphatase: 48 U/L (ref 39–117)
Bilirubin, Direct: 0.1 mg/dL (ref 0.0–0.3)
Total Bilirubin: 0.9 mg/dL (ref 0.2–1.2)
Total Protein: 6.9 g/dL (ref 6.0–8.3)

## 2014-11-04 LAB — LIPID PANEL
CHOLESTEROL: 139 mg/dL (ref 0–200)
HDL: 66.7 mg/dL (ref >39.00)
LDL CALC: 61 mg/dL (ref 0–99)
NonHDL: 72.3
TRIGLYCERIDES: 59 mg/dL (ref 0.0–149.0)
Total CHOL/HDL Ratio: 2
VLDL: 11.8 mg/dL (ref 0.0–40.0)

## 2014-11-05 DIAGNOSIS — R202 Paresthesia of skin: Secondary | ICD-10-CM | POA: Diagnosis not present

## 2014-11-06 ENCOUNTER — Encounter: Payer: Self-pay | Admitting: Internal Medicine

## 2014-11-06 ENCOUNTER — Ambulatory Visit (INDEPENDENT_AMBULATORY_CARE_PROVIDER_SITE_OTHER): Payer: Medicare Other | Admitting: Internal Medicine

## 2014-11-06 VITALS — BP 132/74 | HR 88 | Temp 97.5°F | Resp 18 | Ht 63.5 in | Wt 131.2 lb

## 2014-11-06 DIAGNOSIS — G894 Chronic pain syndrome: Secondary | ICD-10-CM | POA: Diagnosis not present

## 2014-11-06 DIAGNOSIS — Z6822 Body mass index (BMI) 22.0-22.9, adult: Secondary | ICD-10-CM

## 2014-11-06 DIAGNOSIS — K2100 Gastro-esophageal reflux disease with esophagitis, without bleeding: Secondary | ICD-10-CM

## 2014-11-06 DIAGNOSIS — M5431 Sciatica, right side: Secondary | ICD-10-CM

## 2014-11-06 DIAGNOSIS — M5126 Other intervertebral disc displacement, lumbar region: Secondary | ICD-10-CM

## 2014-11-06 DIAGNOSIS — R110 Nausea: Secondary | ICD-10-CM

## 2014-11-06 NOTE — Patient Instructions (Signed)
  Stop Compazine (Prochlorperazine)  Use Zofran if have severe nausea  ===============================  Suggest see Dr [name] to evaluate for Nausea

## 2014-11-06 NOTE — Progress Notes (Signed)
Subjective:    Patient ID: [name] [name], female    DOB: 07/17/1935, 79 y.o.   MRN: [id number]  HPI   79 yo MWF returns for f/u of med adjustment for chronic LBP. Has hx of "bulging" disk and has seen Dr Louanne Skye who did not recommend surgery. Patient also relates she saw Dr Ernestina Patches & had EDSI and more recently has 2 back injections  By Dr Andree Elk [facility] w/o any sig improvement and apparently had each time she had a transient post injection bilat LE paresthesias. Over the last month her gabapentin was reduced from 300 to 100 mg tid and she was advised to limit her vicodin use to judicious prn usage only as she was almost stuporous on the higher Gabapentin and maintenance Hydrocodone dosing. Also she was advised to taper her compazine 10 tid to 5 tid. Today she demonstrate very vigorous and uncontrollable  rapid repetative foot tapping felt likely an EPSx of her Compazine. She continues to c/po nausea and possibly Heartburn. No waterbrash or definite reflux sx's.   Medication Sig  . acetaminophen  500 MG tablet Take 500 mg by mouth every 6 (six) hours as needed for moderate pain.  . TUDORZA PRESSAIR 400 MC Inhale 1 puff into the lungs 2 (two) times daily as needed (wheezing).   [name] albuterol  HFA  inhaler Inhale 1-2 puffs into the lungs every 6 (six) hours as needed for wheezing or shortness of breath.  . ALPRAZolam  0.5 MG tablet TAKE ONE TABLET BY MOUTH THREE TIMES DAILY AS NEEDED  . amLODipine  10 MG tablet Take 1 tablet (10 mg total) by mouth at bedtime.  [name] aspirin EC 81 MG tab Take 1 tablet (81 mg total) by mouth.  [name] VITAMIN D 2000 UNITS  Take 6,000 Units by mouth daily.   . citalopram  20 MG tablet Take 1 tablet daly for chronic pain and mood  . GLUCERNA SHAKE Take 237 mLs by mouth 3 (three) times daily between meals.  . gabapentin  100 MG capsule Take 1 capsule (100 mg total) by mouth 3 (three) times daily.  . DUONEB 0.5-2.5  MG/3ML SOLN USE ONE VIAL IN NEBULIZER 4 TIMES DAILY   .  losartan (COZAAR) 100 MG tablet TAKE ONE TABLET BY MOUTH ONCE DAILY   . Metoprolol tartrate  50 MG tablet Take one half tablet qhs  . DULERA  200-5  Inhale 2 puffs into the lungs 2 (two) times daily.  [name] NITROSTAT 0.4 MG SL Place 1 tablet (0.4 mg total) under the tongue every 5 (five) minutes as needed for chest pain.  [name] ZOFRAN ODT 4 MG  Take 1 tablet (4 mg total) by mouth every 8 (eight) hours as needed for nausea or vomiting.  . Coral Calcium 1000mg  Take 1,000 mg by mouth 2 (two) times daily.   .  Magnesium Carbonate 400mg  Take 400 mg by mouth 2 (two) times daily.  . pantoprazole  40 MG tablet Take 1 tablet (40 mg total) by mouth daily.  [name] MIRALAX  packet Take 17 g by mouth 2 (two) times daily.  . pravastatin  40 MG tablet Take 40 mg by mouth daily.  . prochlorperazine (COMPAZINE) 5 MG tab Take 1 capsule 3 x day before meals only if needed for Nausea  . SENOKOT 8.6 MG TABS tablet Take 1 tablet by mouth 2 (two) times daily.   . sucralfate 1 G tablet TAKE ONE TABLET BY MOUTH 4 TIMES DAILY  BEFORE MEALS AND AT BEDTIME FOR HEARTBURN   Allergies  Allergen Reactions  . Aspartame Diarrhea and Nausea And Vomiting  . Daliresp [Roflumilast] Nausea And Vomiting  . Levofloxacin Nausea And Vomiting  . Shellfish Allergy Other (See Comments)    Flushing  . Tramadol Other (See Comments)    Blurred vision   . Atorvastatin Other (See Comments)    Muscle aches  . Ace Inhibitors Cough    Per Dr Melvyn Novas pulmonology 2013  . Biaxin [Clarithromycin] Other (See Comments)    PATIENT CAN TOLERATE Z-PAK.  Is written on patient's paper chart.  . Erythromycin Other (See Comments)    PATIENT CAN TOLERATE Z-PAK.  It is written on patient's paper chart.  . Hydrocodone Nausea Only   Past Medical History  Diagnosis Date  . CAD (coronary artery disease)     LHC 2003 with 40% pLAD, 30% mLAD, 100% D1 (moderate vessel), 100%  D2 (small vessel), 95% mid small OM2.  Pt had PTCA to D1;  D2 and OM2 small vessels and tx  medically;  Last Myoview (2012): anterior infarct seen with scar, no ischemia. EF normal. Pt managed medically;  Lexiscan Myoview (12/14):  Low risk; ant scar with very mild peri-infarct ischemia; EF 55% with ant AK   . Recurrent aspiration bronchitis/pneumonia     PFTs 12/09 FVC 97%, FEV1 101%, ratio 77%, TLC 109%. No significant obstruction or restriction.   . Hypertension   . Hyperlipidemia   . Esophageal stricture   . Gastritis   . GERD (gastroesophageal reflux disease)   . Asthma   . Blood transfusion   . Cough   . Wheezing   . Weakness   . Trouble swallowing   . Abdominal pain   . Hiatal hernia   . Shortness of breath   . Anxiety   . TIA (transient ischemic attack)   . BRONCHITIS, ACUTE 07/25/2007         . DIVERTICULOSIS, COLON 08/28/2008    Qualifier: Diagnosis of  By: [name], [name], [name]    . IRON DEFICIENCY ANEMIA, HX OF 07/24/2007    Qualifier: Diagnosis of  By: Tilden Dome    . Chronic nausea   . Chronic back pain   . PONV (postoperative nausea and vomiting)   . CAD (coronary artery disease)    Past Surgical History  Procedure Laterality Date  . Back surgery    . Bladder repair    . Hemorrhoid surgery    . Cholecystectomy      Patient unsure of date.  [name] Appendectomy      patient unsure of date  . Total abdominal hysterectomy  1975    partial  . Coronary angioplasty  11/16/2001  . Eye surgery  11/2000    bilateral cataracts with lens implant  . Hiatal hernia repair  06/03/2011    Procedure: LAPAROSCOPIC REPAIR OF HIATAL HERNIA;  Surgeon: Adin Hector, MD;  Location: [facility];  Service: General;  Laterality: N/A;  . Laparoscopic nissen fundoplication  5/80/9983    Procedure: LAPAROSCOPIC NISSEN FUNDOPLICATION;  Surgeon: Adin Hector, MD;  Location: [facility];  Service: General;  Laterality: N/A;  . Lumbar laminectomy/decompression microdiscectomy Right 06/26/2012    Procedure: LUMBAR LAMINECTOMY/DECOMPRESSION MICRODISCECTOMY;  Surgeon: Jessy Oto, MD;   Location: [place];  Service: Orthopedics;  Laterality: Right;  RIGHT L4-5 MICRODISCECTOMY  . Esophagogastroduodenoscopy N/A 03/15/2013    Procedure: ESOPHAGOGASTRODUODENOSCOPY (EGD);  Surgeon: Jerene Bears, MD;  Location[facility];  Service: Endoscopy;  Laterality: N/A;  . Esophageal manometry N/A 03/25/2013    Procedure: ESOPHAGEAL MANOMETRY (EM);  Surgeon: Inda Castle, MD;  Location: [facility];  Service: Endoscopy;  Laterality: N/A;  . Hiatal hernia repair N/A 04/24/2013    Procedure: LAPAROSCOPIC  REPAIR RECURRENT PARASOPHAGEAL HIATAL HERNIA WITH FUNDOPLICATION;  Surgeon: Adin Hector, MD;  Location: [facility];  Service: General;  Laterality: N/A;  . Insertion of mesh N/A 04/24/2013    Procedure: INSERTION OF MESH;  Surgeon: Adin Hector, MD;  Location: [facility];  Service: General;  Laterality: N/A;  . Laparoscopic lysis of adhesions N/A 04/24/2013    Procedure: LAPAROSCOPIC LYSIS OF ADHESIONS;  Surgeon: Adin Hector, MD;  Location: [facility];  Service: General;  Laterality: N/A;  . Fixation kyphoplasty lumbar spine  X 2    "L3-4"   Review of Systems 10 point systems review negative except as above.    Objective:   Physical Exam  BP 132/74 mmHg  Pulse 88  Temp(Src) 97.5 F (36.4 C)  Resp 18  Ht 5' 3.5" (1.613 m)  Wt 131 lb 3.2 oz (59.512 kg)  BMI 22.87 kg/m2  Flat affect and appears uncomfortable.   HEENT - Eac's patent. TM's Nl. EOM's full. PERRLA. NasoOroPharynx clear. Neck - supple. Nl Thyroid. Carotids 2+ & No bruits, nodes, JVD Chest - Clear equal BS w/o Rales, rhonchi, wheezes. Cor - Nl HS. RRR w/o sig MGR. PP 1(+). No edema. Abd - No palpable organomegaly, masses or tenderness. BS nl. MS- very stiff erect posture and short steppage gait.  Neuro - No obvious Cr N abnormalities. Sensory, motor and Cerebellar functions appear Nl w/o focal abnormalities. Psyche - Flat affect.      Assessment & Plan:   1. Herniated lumbar intervertebral disc  - Ambulatory  referral to Orthopedics  - Patient requests referral as recommended by her insurance company  2. Sciatica associated with disorder of lumbosacral spine, right  - Ambulatory referral to Orthopedics  3. Chronic pain syndrome  - Ambulatory referral to Orthopedics  4. Nausea without vomiting  - Advised d/c Compazine for suspected EPSx's and recommended that she use her Zofran as recommended.   - Ambulatory referral to Gastroenterology  5. Gastroesophageal reflux disease with esophagitis  - Ambulatory referral to Gastroenterology  6. Body mass index (BMI) of 22.0-22.9 in adult  - has f/u here in ~ 3 weeks - discussed/reviewed meds & SE's.

## 2014-11-12 DIAGNOSIS — M549 Dorsalgia, unspecified: Secondary | ICD-10-CM | POA: Diagnosis not present

## 2014-11-12 DIAGNOSIS — M533 Sacrococcygeal disorders, not elsewhere classified: Secondary | ICD-10-CM | POA: Diagnosis not present

## 2014-11-12 NOTE — Progress Notes (Signed)
PT Note - Late G code entry    09-30-14 1403  PT G-Codes **NOT FOR INPATIENT CLASS**  Functional Assessment Tool Used clinical judgement  Functional Limitation Mobility: Walking and moving around  Mobility: Walking and Moving Around Current Status [phone]) CH  Mobility: Walking and Moving Around Goal Status [phone]) CH  Mobility: Walking and Moving Around Discharge Status [phone]) [facility] [phone]

## 2014-11-14 ENCOUNTER — Other Ambulatory Visit: Payer: Self-pay | Admitting: Specialist

## 2014-11-14 DIAGNOSIS — M533 Sacrococcygeal disorders, not elsewhere classified: Secondary | ICD-10-CM

## 2014-11-18 ENCOUNTER — Encounter: Payer: Self-pay | Admitting: Gastroenterology

## 2014-11-19 DIAGNOSIS — M79606 Pain in leg, unspecified: Secondary | ICD-10-CM | POA: Diagnosis not present

## 2014-11-19 DIAGNOSIS — G894 Chronic pain syndrome: Secondary | ICD-10-CM | POA: Diagnosis not present

## 2014-11-19 DIAGNOSIS — Z79899 Other long term (current) drug therapy: Secondary | ICD-10-CM | POA: Diagnosis not present

## 2014-11-19 DIAGNOSIS — M47817 Spondylosis without myelopathy or radiculopathy, lumbosacral region: Secondary | ICD-10-CM | POA: Diagnosis not present

## 2014-11-19 DIAGNOSIS — G2581 Restless legs syndrome: Secondary | ICD-10-CM | POA: Diagnosis not present

## 2014-11-19 DIAGNOSIS — M545 Low back pain: Secondary | ICD-10-CM | POA: Diagnosis not present

## 2014-11-24 ENCOUNTER — Ambulatory Visit (INDEPENDENT_AMBULATORY_CARE_PROVIDER_SITE_OTHER): Payer: Medicare Other | Admitting: Neurology

## 2014-11-24 ENCOUNTER — Encounter: Payer: Self-pay | Admitting: Neurology

## 2014-11-24 ENCOUNTER — Encounter: Payer: Self-pay | Admitting: Internal Medicine

## 2014-11-24 ENCOUNTER — Ambulatory Visit (INDEPENDENT_AMBULATORY_CARE_PROVIDER_SITE_OTHER): Payer: Medicare Other | Admitting: Internal Medicine

## 2014-11-24 VITALS — BP 104/56 | HR 80 | Temp 97.5°F | Resp 16 | Ht 63.5 in | Wt 134.4 lb

## 2014-11-24 VITALS — BP 136/73 | HR 78 | Ht 63.5 in | Wt 135.4 lb

## 2014-11-24 DIAGNOSIS — Z6823 Body mass index (BMI) 23.0-23.9, adult: Secondary | ICD-10-CM

## 2014-11-24 DIAGNOSIS — E782 Mixed hyperlipidemia: Secondary | ICD-10-CM

## 2014-11-24 DIAGNOSIS — I10 Essential (primary) hypertension: Secondary | ICD-10-CM

## 2014-11-24 DIAGNOSIS — R450 Nervousness: Secondary | ICD-10-CM | POA: Diagnosis not present

## 2014-11-24 DIAGNOSIS — G2571 Drug induced akathisia: Secondary | ICD-10-CM

## 2014-11-24 DIAGNOSIS — D519 Vitamin B12 deficiency anemia, unspecified: Secondary | ICD-10-CM | POA: Diagnosis not present

## 2014-11-24 DIAGNOSIS — E559 Vitamin D deficiency, unspecified: Secondary | ICD-10-CM | POA: Diagnosis not present

## 2014-11-24 DIAGNOSIS — R251 Tremor, unspecified: Secondary | ICD-10-CM

## 2014-11-24 DIAGNOSIS — R7309 Other abnormal glucose: Secondary | ICD-10-CM

## 2014-11-24 DIAGNOSIS — R7303 Prediabetes: Secondary | ICD-10-CM

## 2014-11-24 DIAGNOSIS — D509 Iron deficiency anemia, unspecified: Secondary | ICD-10-CM

## 2014-11-24 DIAGNOSIS — D518 Other vitamin B12 deficiency anemias: Secondary | ICD-10-CM | POA: Diagnosis not present

## 2014-11-24 DIAGNOSIS — Z79899 Other long term (current) drug therapy: Secondary | ICD-10-CM

## 2014-11-24 DIAGNOSIS — K219 Gastro-esophageal reflux disease without esophagitis: Secondary | ICD-10-CM | POA: Insufficient documentation

## 2014-11-24 NOTE — Patient Instructions (Signed)
Overall you are doing fairly well but I do want to suggest a few things today:   Remember to drink plenty of fluid, eat healthy meals and do not skip any meals. Try to eat protein with a every meal and eat a healthy snack such as fruit or nuts in between meals. Try to keep a regular sleep-wake schedule and try to exercise daily, particularly in the form of walking, 20-30 minutes a day, if you can.   As far as diagnostic testing: will discuss with Dr. Flavia Shipper  I would like to see you back as needed, sooner if we need to. Please call us with any interim questions, concerns, problems, updates or refill requests.   Please also call us for any test results so we can go over those with you on the phone.  My clinical assistant and will answer any of your questions and relay your messages to me and also relay most of my messages to you.   Our phone number is [phone]. We also have an after hours call service for urgent matters and there is a physician on-call for urgent questions. For any emergencies you know to call 911 or go to the nearest emergency room

## 2014-11-24 NOTE — Progress Notes (Signed)
[facility]    Provider:  Dr Jaynee Eagles Referring Provider: [name], MD Primary Care Physician:  Alesia Richards, MD  CC:  Tremor of the left leg  HPI:  [name] [name] is a 79 y.o. female here as a referral from Dr. Melford Aase for tremor of the left leg. Started 3-4 weeks ago tremor in the left leg. She had started reglan at the time, stopped the reglan and hasn't notice if it helped the leg or not, maybe it has. She feels fidgety. She doesn't seem to realize when the leg tremor starts but she can stop it on command and it is in her voluntary control (she says she didn't realize she could this until today). No problems walking. She endorses pain in the low back and in the right leg.  Symptoms not getting worse. She can stop and start the tremor voluntarily. She feels her leg is restless. She takes ativan for restless legs. She can stop her leg jumping herself without putting her foot down. She also has a lot of leg movements  at night, husband says he wakes up at night and her leg is jumping. He pushes her and nudges her and it quits. She denies numbness or tingling in the toes. Decreasing the neurontin did not make a difference to her leg tremor. She is on xanax for RLS. She does have restless legs. No neck or middle back pain. The tremor is not disturbing to her, doesn't bother her at all. More disturbing to her husband. No falls. Not interfering with her gait or movements. No other focal neurologic complaints. No hypophonia. No drooling. No hypographia. No shuffling. No resting arm tremor. No falls. No dysphagia.   Several times I asked her to stop shaking her leg and she was able to comply on command.  TSH wnl.   Review of Systems: Patient complains of symptoms per HPI as well as the following symptoms: Weight loss, fatigue, easy bruising, feeling cold, snoring, trouble swallowing, constipation, urination problems, tremor, anxiety, not enough sleep, change in appetite  . Pertinent negatives per HPI. All others negative.   History   Social History  . Marital Status: Married    Spouse Name: [name]  . Number of Children: 2  . Years of Education: 12   Occupational History  . Retired     worked at [employer]  . Smoking status: Never Smoker   . Smokeless tobacco: Never Used  . Alcohol Use: No  . Drug Use: No  . Sexual Activity: No   Other Topics Concern  . Not on file   Social History Narrative   Lives with husband.   Right-handed.   3 cups caffeine per day.    Family History  Problem Relation Age of Onset  . Cancer Mother     colon   . Colon cancer Mother 41  . Heart disease Father 69    heart attack    Past Medical History  Diagnosis Date  . CAD (coronary artery disease)     LHC 2003 with 40% pLAD, 30% mLAD, 100% D1 (moderate vessel), 100%  D2 (small vessel), 95% mid small OM2.  Pt had PTCA to D1;  D2 and OM2 small vessels and tx medically;  Last Myoview (2012): anterior infarct seen with scar, no ischemia. EF normal. Pt managed medically;  Lexiscan Myoview (12/14):  Low risk; ant scar with very mild peri-infarct ischemia; EF 55% with ant AK   .  Recurrent aspiration bronchitis/pneumonia     PFTs 12/09 FVC 97%, FEV1 101%, ratio 77%, TLC 109%. No significant obstruction or restriction.   . Hypertension   . Hyperlipidemia   . Esophageal stricture   . Gastritis   . GERD (gastroesophageal reflux disease)   . Asthma   . Blood transfusion   . Cough   . Wheezing   . Weakness   . Trouble swallowing   . Abdominal pain   . Hiatal hernia   . Shortness of breath   . Anxiety   . TIA (transient ischemic attack)   . BRONCHITIS, ACUTE 07/25/2007         . DIVERTICULOSIS, COLON 08/28/2008    Qualifier: Diagnosis of  By: [name], [name], [name]    . IRON DEFICIENCY ANEMIA, HX OF 07/24/2007    Qualifier: Diagnosis of  By: Tilden Dome    . Chronic nausea   . Chronic back pain   . PONV (postoperative  nausea and vomiting)   . CAD (coronary artery disease)     Past Surgical History  Procedure Laterality Date  . Back surgery    . Bladder repair    . Hemorrhoid surgery    . Cholecystectomy      Patient unsure of date.  [name] Appendectomy      patient unsure of date  . Total abdominal hysterectomy  1975    partial  . Coronary angioplasty  11/16/2001  . Eye surgery  11/2000    bilateral cataracts with lens implant  . Hiatal hernia repair  06/03/2011    Procedure: LAPAROSCOPIC REPAIR OF HIATAL HERNIA;  Surgeon: Adin Hector, MD;  Location: [facility];  Service: General;  Laterality: N/A;  . Laparoscopic nissen fundoplication  6/97/9480    Procedure: LAPAROSCOPIC NISSEN FUNDOPLICATION;  Surgeon: Adin Hector, MD;  Location: [facility];  Service: General;  Laterality: N/A;  . Lumbar laminectomy/decompression microdiscectomy Right 06/26/2012    Procedure: LUMBAR LAMINECTOMY/DECOMPRESSION MICRODISCECTOMY;  Surgeon: Jessy Oto, MD;  Location: [place];  Service: Orthopedics;  Laterality: Right;  RIGHT L4-5 MICRODISCECTOMY  . Esophagogastroduodenoscopy N/A 03/15/2013    Procedure: ESOPHAGOGASTRODUODENOSCOPY (EGD);  Surgeon: Jerene Bears, MD;  [facility];  Service: Endoscopy;  Laterality: N/A;  . Esophageal manometry N/A 03/25/2013    Procedure: ESOPHAGEAL MANOMETRY (EM);  Surgeon: Inda Castle, MD;  [facility];  Service: Endoscopy;  Laterality: N/A;  . Hiatal hernia repair N/A 04/24/2013    Procedure: LAPAROSCOPIC  REPAIR RECURRENT PARASOPHAGEAL HIATAL HERNIA WITH FUNDOPLICATION;  Surgeon: Adin Hector, MD;  Location: [facility];  Service: General;  Laterality: N/A;  . Insertion of mesh N/A 04/24/2013    Procedure: INSERTION OF MESH;  Surgeon: Adin Hector, MD;  Location: [facility];  Service: General;  Laterality: N/A;  . Laparoscopic lysis of adhesions N/A 04/24/2013    Procedure: LAPAROSCOPIC LYSIS OF ADHESIONS;  Surgeon: Adin Hector, MD;  Location: [facility];  Service:  General;  Laterality: N/A;  . Fixation kyphoplasty lumbar spine  X 2    "L3-4"    Current Outpatient Prescriptions  Medication Sig Dispense Refill  . albuterol (PROVENTIL HFA;VENTOLIN HFA) 108 (90 BASE) MCG/ACT inhaler Inhale 1-2 puffs into the lungs every 6 (six) hours as needed for wheezing or shortness of breath.    [name] alendronate (FOSAMAX) 70 MG tablet Take 70 mg by mouth once a week. Take with a full glass of water on an empty stomach.    . ALPRAZolam (XANAX) 0.5 MG  tablet TAKE ONE TABLET BY MOUTH THREE TIMES DAILY AS NEEDED 90 tablet 0  . amLODipine (NORVASC) 10 MG tablet Take 1 tablet (10 mg total) by mouth at bedtime. 30 tablet 1  . aspirin EC 81 MG tablet Take 1 tablet (81 mg total) by mouth.    . Cholecalciferol (VITAMIN D3) 2000 UNITS TABS Take 6,000 Units by mouth daily.     . feeding supplement, GLUCERNA SHAKE, (GLUCERNA SHAKE) LIQD Take 237 mLs by mouth 3 (three) times daily between meals. 90 Can 0  . gabapentin (NEURONTIN) 100 MG capsule Take 1 capsule (100 mg total) by mouth 3 (three) times daily. 90 capsule 5  . ipratropium-albuterol (DUONEB) 0.5-2.5 (3) MG/3ML SOLN USE ONE VIAL IN NEBULIZER 4 TIMES DAILY (Patient taking differently: Inhale 3 mLs into the lungs every 6 (six) hours as needed. For shortness of breath) 360 mL 99  . losartan (COZAAR) 100 MG tablet TAKE ONE TABLET BY MOUTH ONCE DAILY (DISCONTINUE  BENICAR) 90 tablet 3  . metoprolol (LOPRESSOR) 50 MG tablet TAKE ONE TABLET BY MOUTH TWICE DAILY FOR BLOOD PRESSURE (Patient taking differently: Take one half tablet qhs) 180 tablet 1  . mometasone-formoterol (DULERA) 200-5 MCG/ACT AERO Inhale 2 puffs into the lungs 2 (two) times daily. 13 g 3  . nitroGLYCERIN (NITROSTAT) 0.4 MG SL tablet Place 1 tablet (0.4 mg total) under the tongue every 5 (five) minutes as needed for chest pain. 90 tablet 3  . ondansetron (ZOFRAN ODT) 4 MG disintegrating tablet Take 1 tablet (4 mg total) by mouth every 8 (eight) hours as needed for nausea  or vomiting. 10 tablet 0  . OVER THE COUNTER MEDICATION Take 1,000 mg by mouth 2 (two) times daily. Coral Calcium 1000mg     . OVER THE COUNTER MEDICATION Take 400 mg by mouth 2 (two) times daily. Magnesium Carbonate 400mg     . oxyCODONE-acetaminophen (PERCOCET/ROXICET) 5-325 MG per tablet Take 1 tablet by mouth every 4 (four) hours as needed for severe pain.    . pantoprazole (PROTONIX) 40 MG tablet Take 1 tablet (40 mg total) by mouth daily. 30 tablet 2  . polyethylene glycol (MIRALAX / GLYCOLAX) packet Take 17 g by mouth 2 (two) times daily.    . pravastatin (PRAVACHOL) 40 MG tablet Take 40 mg by mouth daily.    . prochlorperazine (COMPAZINE) 5 MG tablet Take 1 capsule 3 x day before meals only if needed for Nausea 90 tablet 5  . senna (SENOKOT) 8.6 MG TABS tablet Take 1 tablet by mouth 2 (two) times daily.     . sucralfate (CARAFATE) 1 G tablet TAKE ONE TABLET BY MOUTH 4 TIMES DAILY BEFORE MEALS AND AT BEDTIME FOR HEARTBURN 100 tablet 0   No current facility-administered medications for this visit.    Allergies as of 11/24/2014 - Review Complete 11/24/2014  Allergen Reaction Noted  . Aspartame Diarrhea and Nausea And Vomiting   . Daliresp [roflumilast] Nausea And Vomiting 05/23/2011  . Levofloxacin Nausea And Vomiting 11/04/2013  . Shellfish allergy Other (See Comments) 03/07/2014  . Tramadol Other (See Comments) 10/22/2013  . Atorvastatin Other (See Comments) 09/04/2014  . Ace inhibitors Cough 04/03/2013  . Biaxin [clarithromycin] Other (See Comments) 03/07/2014  . Erythromycin Other (See Comments) 03/07/2014  . Hydrocodone Nausea Only 05/16/2013    Vitals: BP 136/73 mmHg  Pulse 78  Ht 5' 3.5" (1.613 m)  Wt 135 lb 6.4 oz (61.417 kg)  BMI 23.61 kg/m2 Last Weight:  Wt Readings from Last 1 Encounters:  11/24/14  135 lb 6.4 oz (61.417 kg)   Last Height:   Ht Readings from Last 1 Encounters:  11/24/14 5' 3.5" (1.613 m)   Physical exam: Exam: Gen: flat affect                      CV: RRR, no MRG. No Carotid Bruits. No peripheral edema, warm, nontender Eyes: Conjunctivae clear without exudates or hemorrhage  Neuro: Detailed Neurologic Exam  Speech:    Speech is normal; fluent and spontaneous with normal comprehension.  Cognition:    The patient is oriented to person, place, and time;     recent and remote memory intact;     language fluent;     normal attention, concentration,     fund of knowledge Cranial Nerves:    The pupils are equal, round, and reactive to light. The fundi are flat. Visual fields are full to finger confrontation. Extraocular movements are intact. Trigeminal sensation is intact and the muscles of mastication are normal. The face is symmetric. The palate elevates in the midline. Hearing intact. Voice is normal. Shoulder shrug is normal. The tongue has normal motion without fasciculations.   Coordination:    No dysmetria  Gait:    No ataxia  Motor Observation:    Left leg shaking, can voluntarily stop  Tone:    Normal muscle tone.    Posture:    Posture is normal. normal erect    Strength: 4/5 right leg                  4+/5 left leg IP     Otherwise strength is V/V in the upper and lower limbs.      Sensation: intact to LT, pp , temp, vibration  Reflex Exam:  DTR's:     Deep tendon reflexes in the upper and lower extremities are brisk for age bilaterally.   Toes:    The toes are downgoing bilaterally.   Clonus:    Clonus is absent.     Assessment/Plan:   79 y.o. female here as a referral from Dr. Melford Aase for tremor of the left leg. Started 3-4 weeks ago. She had started reglan at the time, stopped the reglan and hasn't notice if it helped the leg or not, maybe it has. She feels fidgety. She doesn't seem to realize when the leg tremor starts but she can stop it on command and it is within her voluntary control.   Multiple times I asked patient to stop shaking her leg, she was able to comply easily. She says she feels  fidgety. No other significant signs of parkinsonism. Leg tremor could be akathisia secondary to the Reglan or other medication use such as her compazine. Agree with Dr. Louanne Skye she should stop all these types of medications. Her sensory exam is normal and she has no symptoms of polyneuropathy. Could also be RLS.  it is not concerning to patient or distressing to her.It is not interfering with her gait or causing falls. She declines further wkup. I did advice her to follow up with me if symptoms persist and especially if it worsens or she develops new symptoms.  She has some upper motor neuron signs such as brisk reflexes. Offered to work up with [facility]. Patient and husband declined any further workup. They say they are spending too much time an money going to doctors. Will forward note to Dr. Louanne Skye for review and comment.  Sarina Ill, MD  [facility] [address] [place], [place] [address]  Phone [phone] Fax [phone]

## 2014-11-24 NOTE — Patient Instructions (Addendum)

## 2014-11-24 NOTE — Progress Notes (Signed)
Patient ID: [name] [name], female   DOB: 28-Apr-1936, 79 y.o.   MRN: [id number]   This very nice 79 y.o.  MWF presents for 3 month follow up with Hypertension, Hyperlipidemia, Pre-Diabetes and Vitamin D Deficiency. Patient is currently followed at [facility] and by Dr Louanne Skye for Lumbago/sciatica attributed to DDD. Other problems include GERD which currently is stable and controlled on her meds. Recently she was started on Fosamax by Dr Chauncey Cruel. Devshwar for osteoporosis.    Patient is treated for HTN since 1986  & BP has been controlled at home. Today's BP: (!) 104/56 mmHg. She also has ASCAD and had PTCA & Stenting in 2003. Patient has had no complaints of any cardiac type chest pain, palpitations, dyspnea/orthopnea/PND, dizziness, claudication, or dependent edema.   Hyperlipidemia is controlled with diet & meds. Patient denies myalgias or other med SE's. Last Lipids were at goal - Total Chol 139; HDL 66; LDL 61; Trig 59.0 on 11/04/2014.   Also, the patient has history of PreDiabetes since 2013 with A1c 6.1% and has had no symptoms of reactive hypoglycemia, diabetic polys, paresthesias or visual blurring.  Last A1c was  5.9% on 09/20/2014.    Further, the patient also has history of Vitamin D Deficiency of 25 in 2008 and supplements vitamin D without any suspected side-effects. Last vitamin D was 54 on 08/20/2014.  Medication Sig  . albuterol  HFA inhaler Inhale 1-2 puffs  every 6  hours as needed   . ALPRAZolam  0.5 MG tablet TAKE ONE TAB THREE TIMES DAILY AS NEEDED  . amLODipine  10 MG tablet Take 1 tab at bedtime.  [name] aspirin EC 81 MG tablet Take 1 tab  . VITAMIN D 2000 UNITS TABS Take 6,000 Units  daily.   Liliane Shi SHAKE Take 237 mLs  3  times daily between meals.  . gabapentin  100 MG capsule Take 1 caps 3 times daily.  . DUONEB 0.5-2.5 (3) MG/3ML SOLN USE ONE VIAL IN NEB 4 TIMES DAILY   . losartan (COZAAR) 100 MG tablet TAKE ONE TAB ONCE DAILY   . metoprolol (LOPRESSOR) 50 MG tablet TAKE  ONE TAB TWICE DAILY - Takes one half tablet qhs)  . DULERA 200-5 MCG Inhale 2 puffs  2  times daily.  [name] NITROSTAT  0.4 MG  Place 1 tablet  under the tongue every 5  minutes as needed  . ondansetron (ZOFRAN ODT) 4 MG  Take 1 tab every 8  hours as needed for nausea or vomiting.  . Coral Calcium 1000mg  Take 1,000 mg  2  times daily.   . Magnesium Carbonate 400mg  Take 400 mg by mouth 2 (two) times daily.   . Pantoprazole  40 MG tablet Take 1 tabdaily.  . polyethylene glycol / MIRALAX  Take 17 g  2times daily.  . pravastatin  40 MG tablet Take 40 mg  daily.  . prochlorperazine  5 MG tablet Take 1 capsule 3 x day before meals only if needed for Nausea  . SENOKOT Take 1 tab 2  times daily.   . sucralfate  1 G tablet TAKE ONE TAB 4 TIMES DAILY BEFORE MEALS AND AT BEDTIME    Allergies  Allergen Reactions  . Aspartame Diarrhea and Nausea And Vomiting  . Daliresp [Roflumilast] Nausea And Vomiting  . Levofloxacin Nausea And Vomiting  . Shellfish Allergy Other (See Comments)    Flushing  . Tramadol Other (See Comments)    Blurred vision   . Atorvastatin  Other (See Comments)    Muscle aches  . Ace Inhibitors Cough    Per Dr Melvyn Novas pulmonology 2013  . Biaxin [Clarithromycin] Other (See Comments)    PATIENT CAN TOLERATE Z-PAK.  Is written on patient's paper chart.  . Erythromycin Other (See Comments)    PATIENT CAN TOLERATE Z-PAK.  It is written on patient's paper chart.  . Hydrocodone Nausea Only   PMHx:   Past Medical History  Diagnosis Date  . CAD (coronary artery disease)     LHC 2003 with 40% pLAD, 30% mLAD, 100% D1 (moderate vessel), 100%  D2 (small vessel), 95% mid small OM2.  Pt had PTCA to D1;  D2 and OM2 small vessels and tx medically;  Last Myoview (2012): anterior infarct seen with scar, no ischemia. EF normal. Pt managed medically;  Lexiscan Myoview (12/14):  Low risk; ant scar with very mild peri-infarct ischemia; EF 55% with ant AK   . Recurrent aspiration bronchitis/pneumonia      PFTs 12/09 FVC 97%, FEV1 101%, ratio 77%, TLC 109%. No significant obstruction or restriction.   . Hypertension   . Hyperlipidemia   . Esophageal stricture   . Gastritis   . GERD (gastroesophageal reflux disease)   . Asthma   . Blood transfusion   . Cough   . Wheezing   . Weakness   . Trouble swallowing   . Abdominal pain   . Hiatal hernia   . Shortness of breath   . Anxiety   . TIA (transient ischemic attack)   . BRONCHITIS, ACUTE 07/25/2007         . DIVERTICULOSIS, COLON 08/28/2008    Qualifier: Diagnosis of  By: [name], [name], [name]    . IRON DEFICIENCY ANEMIA, HX OF 07/24/2007    Qualifier: Diagnosis of  By: Tilden Dome    . Chronic nausea   . Chronic back pain   . PONV (postoperative nausea and vomiting)   . CAD (coronary artery disease)    Immunization History  Administered Date(s) Administered  . DT 05/06/2014  . Influenza Split 02/07/2011  . Influenza Whole 02/09/2009, 01/19/2010, 02/13/2012  . Influenza, High Dose Seasonal PF 03/22/2013, 01/15/2014  . Pneumococcal Conjugate-13 05/06/2014  . Pneumococcal Polysaccharide-23 02/09/2011  . Pneumococcal-Unspecified 02/06/2010, 02/09/2012  . Td 05/10/2003   Past Surgical History  Procedure Laterality Date  . Back surgery    . Bladder repair    . Hemorrhoid surgery    . Cholecystectomy      Patient unsure of date.  [name] Appendectomy      patient unsure of date  . Total abdominal hysterectomy  1975    partial  . Coronary angioplasty  11/16/2001  . Eye surgery  11/2000    bilateral cataracts with lens implant  . Hiatal hernia repair  06/03/2011    Procedure: LAPAROSCOPIC REPAIR OF HIATAL HERNIA;  Surgeon: Adin Hector, MD;  Location: [facility];  Service: General;  Laterality: N/A;  . Laparoscopic nissen fundoplication  1/76/1607    Procedure: LAPAROSCOPIC NISSEN FUNDOPLICATION;  Surgeon: Adin Hector, MD;  Location: [facility];  Service: General;  Laterality: N/A;  . Lumbar laminectomy/decompression  microdiscectomy Right 06/26/2012    Procedure: LUMBAR LAMINECTOMY/DECOMPRESSION MICRODISCECTOMY;  Surgeon: Jessy Oto, MD;  Location: [facility];  Service: Orthopedics;  Laterality: Right;  RIGHT L4-5 MICRODISCECTOMY  . Esophagogastroduodenoscopy N/A 03/15/2013    Procedure: ESOPHAGOGASTRODUODENOSCOPY (EGD);  Surgeon: Jerene Bears, MD;  Location: [facility];  Service: Endoscopy;  Laterality: N/A;  . Esophageal  manometry N/A 03/25/2013    Procedure: ESOPHAGEAL MANOMETRY (EM);  Surgeon: Inda Castle, MD;  Location: [facility];  Service: Endoscopy;  Laterality: N/A;  . Hiatal hernia repair N/A 04/24/2013    Procedure: LAPAROSCOPIC  REPAIR RECURRENT PARASOPHAGEAL HIATAL HERNIA WITH FUNDOPLICATION;  Surgeon: Adin Hector, MD;  Location: [facility];  Service: General;  Laterality: N/A;  . Insertion of mesh N/A 04/24/2013    Procedure: INSERTION OF MESH;  Surgeon: Adin Hector, MD;  Location: [facility];  Service: General;  Laterality: N/A;  . Laparoscopic lysis of adhesions N/A 04/24/2013    Procedure: LAPAROSCOPIC LYSIS OF ADHESIONS;  Surgeon: Adin Hector, MD;  Location: [facility];  Service: General;  Laterality: N/A;  . Fixation kyphoplasty lumbar spine  X 2    "L3-4"   FHx:    Reviewed / unchanged  SHx:    Reviewed / unchanged  Systems Review:  Constitutional: Denies fever, chills, wt changes, headaches, insomnia, fatigue, night sweats, change in appetite. Eyes: Denies redness, blurred vision, diplopia, discharge, itchy, watery eyes.  ENT: Denies discharge, congestion, post nasal drip, epistaxis, sore throat, earache, hearing loss, dental pain, tinnitus, vertigo, sinus pain, snoring.  CV: Denies chest pain, palpitations, irregular heartbeat, syncope, dyspnea, diaphoresis, orthopnea, PND, claudication or edema. Respiratory: denies cough, dyspnea, DOE, pleurisy, hoarseness, laryngitis, wheezing.  Gastrointestinal: Denies dysphagia, odynophagia, heartburn, reflux, water brash, abdominal pain or  cramps, nausea, vomiting, bloating, diarrhea, constipation, hematemesis, melena, hematochezia  or hemorrhoids. Genitourinary: Denies dysuria, frequency, urgency, nocturia, hesitancy, discharge, hematuria or flank pain. Musculoskeletal: Denies arthralgias, myalgias, stiffness, jt. swelling, pain, limping or strain/sprain.  Skin: Denies pruritus, rash, hives, warts, acne, eczema or change in skin lesion(s). Neuro: No weakness, tremor, incoordination, spasms, paresthesia or pain. Psychiatric: Denies confusion, memory loss or sensory loss. Endo: Denies change in weight, skin or hair change.  Heme/Lymph: No excessive bleeding, bruising or enlarged lymph nodes.  Physical Exam  BP 104/56   Pulse 80  Temp 97.5 F   Resp 16  Ht 5' 3.5"  Wt 134 lb 6.4 oz     BMI 23.43   Appears well nourished and in no distress. Eyes: PERRLA, EOMs, conjunctiva no swelling or erythema. Sinuses: No frontal/maxillary tenderness ENT/Mouth: EAC's clear, TM's nl w/o erythema, bulging. Nares clear w/o erythema, swelling, exudates. Oropharynx clear without erythema or exudates. Oral hygiene is good. Tongue normal, non obstructing. Hearing intact.  Neck: Supple. Thyroid nl. Car 2+/2+ without bruits, nodes or JVD. Chest: Respirations nl with BS clear & equal w/o rales, rhonchi, wheezing or stridor.  Cor: Heart sounds normal w/ regular rate and rhythm without sig. murmurs, gallops, clicks, or rubs. Peripheral pulses normal and equal  without edema.  Abdomen: Soft & bowel sounds normal. Non-tender w/o guarding, rebound, hernias, masses, or organomegaly.  Lymphatics: Unremarkable.  Musculoskeletal: Full ROM all peripheral extremities, joint stability, 5/5 strength, and normal gait.  Skin: Warm, dry without exposed rashes, lesions or ecchymosis apparent.  Neuro: Cranial nerves intact, reflexes equal bilaterally. Sensory-motor testing grossly intact. Tendon reflexes grossly intact.  Pysch: Alert & oriented x 3.  Insight and  judgement nl & appropriate. No ideations.  Assessment and Plan:  1. Essential hypertension  - TSH  2. Mixed hyperlipidemia   3. Prediabetes  - Hemoglobin A1c - Insulin, random  4. Vitamin D deficiency  - Vit D  25 hydroxy  5. Gastroesophageal reflux disease   6. Medication management  - CBC with Differential/Platelet - BASIC METABOLIC PANEL WITH GFR - Hepatic function  panel - Magnesium  7. B12 deficiency anemia  - Vitamin B12  8. Anemia, iron deficiency  - Iron and TIBC   Recommended regular exercise, BP monitoring, weight control, and discussed med and SE's. Recommended labs to assess and monitor clinical status. Further disposition pending results of labs. Over 30 minutes of exam, counseling, chart review was performed

## 2014-11-25 ENCOUNTER — Other Ambulatory Visit: Payer: Self-pay | Admitting: Internal Medicine

## 2014-11-25 DIAGNOSIS — E876 Hypokalemia: Secondary | ICD-10-CM

## 2014-11-25 LAB — CBC WITH DIFFERENTIAL/PLATELET
Basophils Absolute: 0 10*3/uL (ref 0.0–0.1)
Basophils Relative: 0 % (ref 0–1)
EOS ABS: 0 10*3/uL (ref 0.0–0.7)
Eosinophils Relative: 0 % (ref 0–5)
HCT: 42.8 % (ref 36.0–46.0)
HEMOGLOBIN: 14.2 g/dL (ref 12.0–15.0)
LYMPHS ABS: 1.1 10*3/uL (ref 0.7–4.0)
Lymphocytes Relative: 16 % (ref 12–46)
MCH: 32.6 pg (ref 26.0–34.0)
MCHC: 33.2 g/dL (ref 30.0–36.0)
MCV: 98.4 fL (ref 78.0–100.0)
MONOS PCT: 11 % (ref 3–12)
MPV: 9.8 fL (ref 8.6–12.4)
Monocytes Absolute: 0.7 10*3/uL (ref 0.1–1.0)
NEUTROS PCT: 73 % (ref 43–77)
Neutro Abs: 4.9 10*3/uL (ref 1.7–7.7)
PLATELETS: 350 10*3/uL (ref 150–400)
RBC: 4.35 MIL/uL (ref 3.87–5.11)
RDW: 13.6 % (ref 11.5–15.5)
WBC: 6.7 10*3/uL (ref 4.0–10.5)

## 2014-11-25 LAB — BASIC METABOLIC PANEL WITH GFR
BUN: 20 mg/dL (ref 6–23)
CALCIUM: 10 mg/dL (ref 8.4–10.5)
CO2: 26 mEq/L (ref 19–32)
Chloride: 104 mEq/L (ref 96–112)
Creat: 0.8 mg/dL (ref 0.50–1.10)
GFR, Est African American: 82 mL/min
GFR, Est Non African American: 71 mL/min
Glucose, Bld: 97 mg/dL (ref 70–99)
Potassium: 4.8 mEq/L (ref 3.5–5.3)
SODIUM: 142 meq/L (ref 135–145)

## 2014-11-25 LAB — HEPATIC FUNCTION PANEL
ALBUMIN: 4 g/dL (ref 3.5–5.2)
ALK PHOS: 60 U/L (ref 39–117)
ALT: 24 U/L (ref 0–35)
AST: 19 U/L (ref 0–37)
Bilirubin, Direct: 0.1 mg/dL (ref 0.0–0.3)
Indirect Bilirubin: 0.4 mg/dL (ref 0.2–1.2)
Total Bilirubin: 0.5 mg/dL (ref 0.2–1.2)
Total Protein: 6.4 g/dL (ref 6.0–8.3)

## 2014-11-25 LAB — IRON AND TIBC
%SAT: 20 % (ref 20–55)
Iron: 81 ug/dL (ref 42–145)
TIBC: 408 ug/dL (ref 250–470)
UIBC: 327 ug/dL (ref 125–400)

## 2014-11-25 LAB — INSULIN, RANDOM: INSULIN: 29 u[IU]/mL — AB (ref 2.0–19.6)

## 2014-11-25 LAB — VITAMIN B12: Vitamin B-12: >2000 pg/mL — ABNORMAL HIGH (ref 211–911)

## 2014-11-25 LAB — MAGNESIUM: Magnesium: 2.2 mg/dL (ref 1.5–2.5)

## 2014-11-25 LAB — VITAMIN D 25 HYDROXY (VIT D DEFICIENCY, FRACTURES): Vit D, 25-Hydroxy: 66 ng/mL (ref 30–100)

## 2014-11-25 LAB — HEMOGLOBIN A1C
Hgb A1c MFr Bld: 5.9 % — ABNORMAL HIGH (ref <5.7)
MEAN PLASMA GLUCOSE: 123 mg/dL — AB (ref <117)

## 2014-11-25 LAB — TSH: TSH: 1.209 u[IU]/mL (ref 0.350–4.500)

## 2014-11-25 MED ORDER — POTASSIUM CHLORIDE CRYS ER 20 MEQ PO TBCR: 20.0000 meq | EXTENDED_RELEASE_TABLET | Freq: Two times a day (BID) | ORAL | Status: DC

## 2014-11-27 ENCOUNTER — Ambulatory Visit
Admission: RE | Admit: 2014-11-27 | Discharge: 2014-11-27 | Disposition: A | Discharge status: Home / Self Care | Payer: Medicare Other | Source: Ambulatory Visit | Attending: Specialist | Admitting: Specialist

## 2014-11-27 DIAGNOSIS — M533 Sacrococcygeal disorders, not elsewhere classified: Secondary | ICD-10-CM

## 2014-11-27 DIAGNOSIS — M25551 Pain in right hip: Secondary | ICD-10-CM | POA: Diagnosis not present

## 2014-12-01 ENCOUNTER — Other Ambulatory Visit: Payer: Self-pay | Admitting: *Deleted

## 2014-12-01 MED ORDER — PANTOPRAZOLE SODIUM 40 MG PO TBEC
40.0000 mg | DELAYED_RELEASE_TABLET | Freq: Every day | ORAL | Status: DC
Start: 2014-12-01 — End: 2015-02-20

## 2014-12-04 DIAGNOSIS — M533 Sacrococcygeal disorders, not elsewhere classified: Secondary | ICD-10-CM | POA: Diagnosis not present

## 2014-12-04 DIAGNOSIS — M549 Dorsalgia, unspecified: Secondary | ICD-10-CM | POA: Diagnosis not present

## 2014-12-10 ENCOUNTER — Ambulatory Visit (INDEPENDENT_AMBULATORY_CARE_PROVIDER_SITE_OTHER): Payer: Medicare Other | Admitting: Cardiology

## 2014-12-10 ENCOUNTER — Encounter: Payer: Self-pay | Admitting: Cardiology

## 2014-12-10 VITALS — BP 122/68 | HR 73 | Ht 63.5 in | Wt 134.0 lb

## 2014-12-10 DIAGNOSIS — I2510 Atherosclerotic heart disease of native coronary artery without angina pectoris: Secondary | ICD-10-CM

## 2014-12-10 DIAGNOSIS — I2583 Coronary atherosclerosis due to lipid rich plaque: Principal | ICD-10-CM

## 2014-12-10 DIAGNOSIS — E782 Mixed hyperlipidemia: Secondary | ICD-10-CM | POA: Diagnosis not present

## 2014-12-10 NOTE — Patient Instructions (Signed)
Medication Instructions:  No changes today  Labwork: None today  Testing/Procedures: None today  Follow-Up: Your physician wants you to follow-up in: 6 months with Dr Aundra Dubin. (Februrary 2017).  You will receive a reminder letter in the mail two months in advance. If you don't receive a letter, please call our office to schedule the follow-up appointment.   Thank you for choosing [facility]!!

## 2014-12-11 NOTE — Progress Notes (Signed)
Patient ID: [name] [name], female   DOB: [birth year], 79 y.o.   MRN: [id number] PCP: Dr. Melford Aase  79 yo with h/o CAD s/p PTCA to D1 in 2003 presents for cardiology followup. She has had trouble with recurrent asthma/bronchitis flares.  She has had chronic low back pain.  She has also had problems with GERD long-term, had Nissen fundoplication and more recently had repair of recurrent paraesophageal hernia.   She was seen in the ER with chest pain in 5/16.  Troponin was negative.  Lexiscan Cardiolite in 5/16 showed fixed mid-apical anterior perfusion defect consistent with old infarct, no ischemia.    Patient chronically has dyspnea walking up stairs or up hill.  No dyspnea walking on flat ground.  No orthopnea/PND. No further chest pain. Main problem now is low back pain.  This has been very limiting recently.   Labs (5/10): LDL 69, HDL 75  Labs (6/12): LDL 56, HDL 87, BNP 30, HCT 42.4 Labs (4/16): K 4.4, creatinine 1.0, TSH normal, LDL 90, HDL 55, TnI 0, HCT 40.8 Labs (7/16): K 4.8, creatinine 0.8, TSH normal, HCT 42.8 Labs (6/16): LDL 61, HDL 67  Allergies (verified):  1) ! Aspartame (Aspartame)  2) ! * Mycins   Past Medical History:  1. CAD: [place] 2003 with 40% pLAD, 30% mLAD, 100% D1 (moderate vessel), 100% D2 (small vessel), 95% mid small OM2. Pt had PTCA to D1. Last myoview (5/09): EF 66%, anteroseptal/apical infarct with minimal peri-infarct ischemia. No significant change from 11/06. Pt managed medically.  Lexiscan Cardiolite (11/12) with apical anterior infarction, no ischemia, EF 68%.  Lexiscan Cardiolite (12/14) with EF 55%, apical anterior infarct with mild peri-infarct ischemia.  Echo (11/14) with EF 08-65%, PA systolic pressure 41 mmHg. Echo (4/16) with EF 55-60%, mild biatrial enlargement, PASP 35 mmHg. Lexiscan Cardiolite in 5/16 showed fixed mid-apical anterior perfusion defect consistent with old infarct, no ischemia.   2. Recurrent bronchitis, suspect asthma: PFTs 12/09 FVC 97%,  FEV1 101%, ratio 77%, TLC 109%. No significant obstruction or restriction.  Never smoked.  3. HYPERTENSION   4. HYPERLIPIDEMIA   5. ESOPHAGEAL STRICTURE  6. DIVERTICULOSIS 7. HIATAL HERNIA WITH REFLUX: s/p Nissen fundoplication.  She had repair of recurrent paraesophageal hernia in 12/14.  8. IRON DEFICIENCY ANEMIA: EGD, colonoscopy, and capsule endoscopy in fall 2010 unrevealing for source. She then had mesenteric angiogram in 1/11 with no bleeding source found.  ? small bowel AVMs.  9. Low back pain 10.  MGUS: Negative bone marrow biopsy.  11.  History of vertebral compression fracture.   Family History:  Colon cancer in her mother  Heart disease in her father.   Social History:  Patient never smoked.  She is retired since 2002, proior to that she worked at [employer]. She was exposed to acetone and other chemicals at her work.  Married  Alcohol Use - no  Drug Use - no   Review of Systems:  All systems reviewed and negative except as per HPI.   Current Outpatient Prescriptions  Medication Sig Dispense Refill  . albuterol (PROVENTIL HFA;VENTOLIN HFA) 108 (90 BASE) MCG/ACT inhaler Inhale 1-2 puffs into the lungs every 6 (six) hours as needed for wheezing or shortness of breath.    [name] alendronate (FOSAMAX) 70 MG tablet Take 70 mg by mouth once a week. Take with a full glass of water on an empty stomach.    . ALPRAZolam (XANAX) 0.5 MG tablet TAKE ONE TABLET BY MOUTH THREE TIMES DAILY AS NEEDED  90 tablet 0  . amLODipine (NORVASC) 10 MG tablet Take 1 tablet (10 mg total) by mouth at bedtime. 30 tablet 1  . aspirin EC 81 MG tablet Take 1 tablet (81 mg total) by mouth.    . Cholecalciferol (VITAMIN D3) 2000 UNITS TABS Take 6,000 Units by mouth daily.     . feeding supplement, GLUCERNA SHAKE, (GLUCERNA SHAKE) LIQD Take 237 mLs by mouth 3 (three) times daily between meals. 90 Can 0  . gabapentin (NEURONTIN) 100 MG capsule Take 1 capsule (100 mg total) by mouth 3 (three) times daily. 90  capsule 5  . ipratropium-albuterol (DUONEB) 0.5-2.5 (3) MG/3ML SOLN USE ONE VIAL IN NEBULIZER 4 TIMES DAILY (Patient taking differently: Inhale 3 mLs into the lungs every 6 (six) hours as needed. For shortness of breath) 360 mL 99  . losartan (COZAAR) 100 MG tablet TAKE ONE TABLET BY MOUTH ONCE DAILY (DISCONTINUE  BENICAR) 90 tablet 3  . metoprolol (LOPRESSOR) 50 MG tablet TAKE ONE TABLET BY MOUTH TWICE DAILY FOR BLOOD PRESSURE 180 tablet 1  . mometasone-formoterol (DULERA) 200-5 MCG/ACT AERO Inhale 2 puffs into the lungs 2 (two) times daily. 13 g 3  . nitroGLYCERIN (NITROSTAT) 0.4 MG SL tablet Place 1 tablet (0.4 mg total) under the tongue every 5 (five) minutes as needed for chest pain. 90 tablet 3  . ondansetron (ZOFRAN ODT) 4 MG disintegrating tablet Take 1 tablet (4 mg total) by mouth every 8 (eight) hours as needed for nausea or vomiting. 10 tablet 0  . OVER THE COUNTER MEDICATION Take 1,000 mg by mouth 2 (two) times daily. Coral Calcium 1048m    . OVER THE COUNTER MEDICATION Take 400 mg by mouth 2 (two) times daily. Magnesium Carbonate 4062m   . oxyCODONE-acetaminophen (PERCOCET/ROXICET) 5-325 MG per tablet Take 1 tablet by mouth every 4 (four) hours as needed for severe pain.    . pantoprazole (PROTONIX) 40 MG tablet Take 1 tablet (40 mg total) by mouth daily. 30 tablet 2  . polyethylene glycol (MIRALAX / GLYCOLAX) packet Take 17 g by mouth 2 (two) times daily.    . potassium chloride SA (K-DUR,KLOR-CON) 20 MEQ tablet Take 1 tablet (20 mEq total) by mouth 2 (two) times daily. 30 tablet 0  . pravastatin (PRAVACHOL) 40 MG tablet Take 40 mg by mouth daily.    . prochlorperazine (COMPAZINE) 5 MG tablet Take 1 capsule 3 x day before meals only if needed for Nausea 90 tablet 5  . senna (SENOKOT) 8.6 MG TABS tablet Take 1 tablet by mouth 2 (two) times daily.     . sucralfate (CARAFATE) 1 G tablet TAKE ONE TABLET BY MOUTH 4 TIMES DAILY BEFORE MEALS AND AT BEDTIME FOR HEARTBURN 100 tablet 0   No  current facility-administered medications for this visit.    BP 122/68 mmHg  Pulse 73  Ht 5' 3.5" (1.613 m)  Wt 134 lb (60.782 kg)  BMI 23.36 kg/m2  SpO2 97% General: NAD Neck: No JVD, no thyromegaly or thyroid nodule.  Lungs: Clear to auscultation bilaterally with normal respiratory effort.  No wheezing.  CV: Nondisplaced PMI.  Heart regular S1/S2, no S3/S4, no murmur.  No peripheral edema.  No carotid bruit.  Normal pedal pulses.  Abdomen: Soft, nontender, no hepatosplenomegaly, no distention.  Neurologic: Alert and oriented x 3.  Psych: Normal affect. Extremities: No clubbing or cyanosis.   Assessment/Plan: 1. CAD: Prior CAD with D1 PTCA 2003.  Chest pain in 5/16 with Cardiolite showing possible prior MI  with no ischemia.  Cardiolite was similar to the prior nuclear study.  She has had no further chest pain.    - Continue ASA 81, pravastatin.  2.  Hyperlipidemia: Good LDL in 6/16.  3.  Low back pain: Severe, to see Dr Louanne Skye.   Followup in 6 months.   Loralie Champagne 12/11/2014

## 2014-12-16 ENCOUNTER — Ambulatory Visit (INDEPENDENT_AMBULATORY_CARE_PROVIDER_SITE_OTHER): Payer: Medicare Other

## 2014-12-16 DIAGNOSIS — E876 Hypokalemia: Secondary | ICD-10-CM

## 2014-12-16 DIAGNOSIS — R233 Spontaneous ecchymoses: Secondary | ICD-10-CM | POA: Diagnosis not present

## 2014-12-16 LAB — CBC WITH DIFFERENTIAL/PLATELET
Basophils Absolute: 0 10*3/uL (ref 0.0–0.1)
Basophils Relative: 0 % (ref 0–1)
Eosinophils Absolute: 0 10*3/uL (ref 0.0–0.7)
Eosinophils Relative: 0 % (ref 0–5)
HCT: 42.9 % (ref 36.0–46.0)
HEMOGLOBIN: 14.1 g/dL (ref 12.0–15.0)
LYMPHS ABS: 1.3 10*3/uL (ref 0.7–4.0)
Lymphocytes Relative: 19 % (ref 12–46)
MCH: 31.9 pg (ref 26.0–34.0)
MCHC: 32.9 g/dL (ref 30.0–36.0)
MCV: 97.1 fL (ref 78.0–100.0)
MPV: 9.8 fL (ref 8.6–12.4)
Monocytes Absolute: 0.6 10*3/uL (ref 0.1–1.0)
Monocytes Relative: 8 % (ref 3–12)
NEUTROS ABS: 5 10*3/uL (ref 1.7–7.7)
Neutrophils Relative %: 73 % (ref 43–77)
Platelets: 321 10*3/uL (ref 150–400)
RBC: 4.42 MIL/uL (ref 3.87–5.11)
RDW: 12.9 % (ref 11.5–15.5)
WBC: 6.9 10*3/uL (ref 4.0–10.5)

## 2014-12-16 LAB — BASIC METABOLIC PANEL
BUN: 18 mg/dL (ref 7–25)
CALCIUM: 9.1 mg/dL (ref 8.6–10.4)
CO2: 29 mmol/L (ref 20–31)
Chloride: 104 mmol/L (ref 98–110)
Creat: 0.86 mg/dL (ref 0.60–0.93)
Glucose, Bld: 81 mg/dL (ref 65–99)
Potassium: 4.6 mmol/L (ref 3.5–5.3)
SODIUM: 142 mmol/L (ref 135–146)

## 2014-12-16 NOTE — Progress Notes (Signed)
Patient ID: [name] [name], female   DOB: 05/09/1936, 79 y.o.   MRN: [id number] Patient presents for 3 week follow-up labs to recheck BMET for low potassium level.

## 2014-12-17 DIAGNOSIS — M79606 Pain in leg, unspecified: Secondary | ICD-10-CM | POA: Diagnosis not present

## 2014-12-17 DIAGNOSIS — M545 Low back pain: Secondary | ICD-10-CM | POA: Diagnosis not present

## 2014-12-17 DIAGNOSIS — Z79899 Other long term (current) drug therapy: Secondary | ICD-10-CM | POA: Diagnosis not present

## 2014-12-17 DIAGNOSIS — M47817 Spondylosis without myelopathy or radiculopathy, lumbosacral region: Secondary | ICD-10-CM | POA: Diagnosis not present

## 2014-12-17 DIAGNOSIS — G894 Chronic pain syndrome: Secondary | ICD-10-CM | POA: Diagnosis not present

## 2014-12-17 DIAGNOSIS — G2581 Restless legs syndrome: Secondary | ICD-10-CM | POA: Diagnosis not present

## 2014-12-17 LAB — HEPATIC FUNCTION PANEL
ALBUMIN: 3.9 g/dL (ref 3.6–5.1)
ALT: 21 U/L (ref 6–29)
AST: 17 U/L (ref 10–35)
Alkaline Phosphatase: 66 U/L (ref 33–130)
BILIRUBIN DIRECT: 0.1 mg/dL (ref <=0.2)
BILIRUBIN TOTAL: 0.5 mg/dL (ref 0.2–1.2)
Indirect Bilirubin: 0.4 mg/dL (ref 0.2–1.2)
Total Protein: 6.3 g/dL (ref 6.1–8.1)

## 2014-12-17 LAB — APTT: aPTT: 26 seconds (ref 24–37)

## 2014-12-17 LAB — PROTIME-INR
INR: 0.89 (ref <1.50)
Prothrombin Time: 12.1 seconds (ref 11.6–15.2)

## 2014-12-22 ENCOUNTER — Other Ambulatory Visit: Payer: Self-pay | Admitting: Internal Medicine

## 2014-12-22 DIAGNOSIS — M5431 Sciatica, right side: Secondary | ICD-10-CM

## 2014-12-22 DIAGNOSIS — M5126 Other intervertebral disc displacement, lumbar region: Secondary | ICD-10-CM

## 2015-01-08 DIAGNOSIS — R531 Weakness: Secondary | ICD-10-CM | POA: Diagnosis not present

## 2015-01-08 DIAGNOSIS — M533 Sacrococcygeal disorders, not elsewhere classified: Secondary | ICD-10-CM | POA: Diagnosis not present

## 2015-01-08 DIAGNOSIS — M25551 Pain in right hip: Secondary | ICD-10-CM | POA: Diagnosis not present

## 2015-01-08 DIAGNOSIS — M4306 Spondylolysis, lumbar region: Secondary | ICD-10-CM | POA: Diagnosis not present

## 2015-01-09 ENCOUNTER — Encounter: Payer: Self-pay | Admitting: Internal Medicine

## 2015-01-14 DIAGNOSIS — M5417 Radiculopathy, lumbosacral region: Secondary | ICD-10-CM | POA: Diagnosis not present

## 2015-01-14 DIAGNOSIS — Z79899 Other long term (current) drug therapy: Secondary | ICD-10-CM | POA: Diagnosis not present

## 2015-01-14 DIAGNOSIS — M545 Low back pain: Secondary | ICD-10-CM | POA: Diagnosis not present

## 2015-01-14 DIAGNOSIS — G894 Chronic pain syndrome: Secondary | ICD-10-CM | POA: Diagnosis not present

## 2015-01-14 DIAGNOSIS — M5137 Other intervertebral disc degeneration, lumbosacral region: Secondary | ICD-10-CM | POA: Diagnosis not present

## 2015-01-20 ENCOUNTER — Ambulatory Visit: Payer: Medicare Other | Admitting: Gastroenterology

## 2015-01-28 DIAGNOSIS — M810 Age-related osteoporosis without current pathological fracture: Secondary | ICD-10-CM | POA: Diagnosis not present

## 2015-01-30 ENCOUNTER — Other Ambulatory Visit: Payer: Self-pay | Admitting: Physician Assistant

## 2015-02-02 ENCOUNTER — Ambulatory Visit: Payer: Self-pay | Admitting: Internal Medicine

## 2015-02-02 ENCOUNTER — Ambulatory Visit (INDEPENDENT_AMBULATORY_CARE_PROVIDER_SITE_OTHER): Payer: Medicare Other | Admitting: Physician Assistant

## 2015-02-02 ENCOUNTER — Encounter: Payer: Self-pay | Admitting: Physician Assistant

## 2015-02-02 VITALS — BP 138/86 | HR 72 | Temp 97.3°F | Resp 16 | Ht 63.5 in | Wt 137.6 lb

## 2015-02-02 DIAGNOSIS — R350 Frequency of micturition: Secondary | ICD-10-CM

## 2015-02-02 DIAGNOSIS — M19041 Primary osteoarthritis, right hand: Secondary | ICD-10-CM | POA: Diagnosis not present

## 2015-02-02 DIAGNOSIS — M810 Age-related osteoporosis without current pathological fracture: Secondary | ICD-10-CM | POA: Diagnosis not present

## 2015-02-02 DIAGNOSIS — M5137 Other intervertebral disc degeneration, lumbosacral region: Secondary | ICD-10-CM | POA: Diagnosis not present

## 2015-02-02 DIAGNOSIS — M170 Bilateral primary osteoarthritis of knee: Secondary | ICD-10-CM | POA: Diagnosis not present

## 2015-02-02 MED ORDER — SULFAMETHOXAZOLE-TRIMETHOPRIM 800-160 MG PO TABS: 1.0000 | ORAL_TABLET | Freq: Two times a day (BID) | ORAL | Status: DC

## 2015-02-02 NOTE — Progress Notes (Signed)
   Subjective:    Patient ID: [name] [name], female    DOB: [birth year], 79 y.o.   MRN: [id number]  HPI 79 y.o. WF presents with UTI symptoms. She has had dysuria, frequency, urgency, small amounts, some chills without fever, lower back pain, without nausea or vomiting.   Lab Results  Component Value Date   GFRNONAA 71 11/24/2014    Blood pressure 138/86, pulse 72, temperature 97.3 F (36.3 C), resp. rate 16, height 5' 3.5" (1.613 m), weight 137 lb 9.6 oz (62.415 kg).  Review of Systems  Constitutional: Negative for chills.  HENT: Negative.   Respiratory: Negative.   Cardiovascular: Negative.   Gastrointestinal: Negative.  Negative for nausea and vomiting.  Genitourinary: Positive for dysuria, urgency and frequency. Negative for hematuria, flank pain, decreased urine volume, vaginal bleeding, vaginal discharge, enuresis, difficulty urinating, genital sores, menstrual problem, pelvic pain and dyspareunia.       Objective:   Physical Exam  Constitutional: She is oriented to person, place, and time. She appears well-developed and well-nourished.  Neck: Normal range of motion. Neck supple.  Cardiovascular: Normal rate and regular rhythm.   Pulmonary/Chest: Effort normal and breath sounds normal.  Abdominal: Soft. Bowel sounds are normal. She exhibits no distension and no mass. There is tenderness. There is no rebound and no guarding.  Musculoskeletal: Normal range of motion. She exhibits tenderness (right CVA tenderness).  Neurological: She is alert and oriented to person, place, and time.  Skin: Skin is warm and dry.      Assessment & Plan:  1. Urinary frequency - Urinalysis, Routine w reflex microscopic (not at [facility]) - Urine culture - sulfamethoxazole-trimethoprim (BACTRIM DS,SEPTRA DS) 800-160 MG per tablet; Take 1 tablet by mouth 2 (two) times daily.  Dispense: 10 tablet; Refill: 0

## 2015-02-02 NOTE — Patient Instructions (Signed)

## 2015-02-03 LAB — URINALYSIS, ROUTINE W REFLEX MICROSCOPIC
BILIRUBIN URINE: NEGATIVE
Glucose, UA: NEGATIVE
HGB URINE DIPSTICK: NEGATIVE
Ketones, ur: NEGATIVE
Nitrite: NEGATIVE
PH: 7.5 (ref 5.0–8.0)
PROTEIN: NEGATIVE
Specific Gravity, Urine: 1.02 (ref 1.001–1.035)

## 2015-02-03 LAB — URINE CULTURE: Colony Count: 25000

## 2015-02-03 LAB — URINALYSIS, MICROSCOPIC ONLY
BACTERIA UA: NONE SEEN [HPF]
CASTS: NONE SEEN [LPF]
Crystals: NONE SEEN [HPF]
YEAST: NONE SEEN [HPF]

## 2015-02-09 DIAGNOSIS — R3912 Poor urinary stream: Secondary | ICD-10-CM | POA: Diagnosis not present

## 2015-02-09 DIAGNOSIS — R351 Nocturia: Secondary | ICD-10-CM | POA: Diagnosis not present

## 2015-02-11 DIAGNOSIS — M5137 Other intervertebral disc degeneration, lumbosacral region: Secondary | ICD-10-CM | POA: Diagnosis not present

## 2015-02-11 DIAGNOSIS — M545 Low back pain: Secondary | ICD-10-CM | POA: Diagnosis not present

## 2015-02-11 DIAGNOSIS — Z79899 Other long term (current) drug therapy: Secondary | ICD-10-CM | POA: Diagnosis not present

## 2015-02-11 DIAGNOSIS — M792 Neuralgia and neuritis, unspecified: Secondary | ICD-10-CM | POA: Diagnosis not present

## 2015-02-11 DIAGNOSIS — G894 Chronic pain syndrome: Secondary | ICD-10-CM | POA: Diagnosis not present

## 2015-02-11 DIAGNOSIS — M5417 Radiculopathy, lumbosacral region: Secondary | ICD-10-CM | POA: Diagnosis not present

## 2015-02-20 ENCOUNTER — Other Ambulatory Visit: Payer: Self-pay

## 2015-02-20 MED ORDER — PANTOPRAZOLE SODIUM 40 MG PO TBEC: 40.0000 mg | DELAYED_RELEASE_TABLET | Freq: Every day | ORAL | Status: DC

## 2015-02-23 DIAGNOSIS — R3912 Poor urinary stream: Secondary | ICD-10-CM | POA: Diagnosis not present

## 2015-02-23 DIAGNOSIS — R351 Nocturia: Secondary | ICD-10-CM | POA: Diagnosis not present

## 2015-02-25 ENCOUNTER — Other Ambulatory Visit: Payer: Self-pay | Admitting: *Deleted

## 2015-02-25 MED ORDER — ALPRAZOLAM 0.5 MG PO TABS: 0.5000 mg | ORAL_TABLET | Freq: Three times a day (TID) | ORAL | Status: DC | PRN

## 2015-02-27 ENCOUNTER — Encounter: Payer: Self-pay | Admitting: Physician Assistant

## 2015-02-27 ENCOUNTER — Other Ambulatory Visit: Payer: Self-pay

## 2015-02-27 ENCOUNTER — Ambulatory Visit (INDEPENDENT_AMBULATORY_CARE_PROVIDER_SITE_OTHER): Payer: Medicare Other | Admitting: Physician Assistant

## 2015-02-27 VITALS — BP 120/70 | HR 91 | Temp 97.7°F | Resp 14 | Ht 63.5 in | Wt 136.0 lb

## 2015-02-27 DIAGNOSIS — D472 Monoclonal gammopathy: Secondary | ICD-10-CM | POA: Diagnosis not present

## 2015-02-27 DIAGNOSIS — J45909 Unspecified asthma, uncomplicated: Secondary | ICD-10-CM

## 2015-02-27 DIAGNOSIS — F419 Anxiety disorder, unspecified: Secondary | ICD-10-CM

## 2015-02-27 DIAGNOSIS — Z23 Encounter for immunization: Secondary | ICD-10-CM

## 2015-02-27 DIAGNOSIS — I2583 Coronary atherosclerosis due to lipid rich plaque: Secondary | ICD-10-CM

## 2015-02-27 DIAGNOSIS — K440 Diaphragmatic hernia with obstruction, without gangrene: Secondary | ICD-10-CM | POA: Diagnosis not present

## 2015-02-27 DIAGNOSIS — I25719 Atherosclerosis of autologous vein coronary artery bypass graft(s) with unspecified angina pectoris: Secondary | ICD-10-CM

## 2015-02-27 DIAGNOSIS — I209 Angina pectoris, unspecified: Secondary | ICD-10-CM | POA: Diagnosis not present

## 2015-02-27 DIAGNOSIS — F3341 Major depressive disorder, recurrent, in partial remission: Secondary | ICD-10-CM

## 2015-02-27 DIAGNOSIS — I2510 Atherosclerotic heart disease of native coronary artery without angina pectoris: Secondary | ICD-10-CM

## 2015-02-27 DIAGNOSIS — I10 Essential (primary) hypertension: Secondary | ICD-10-CM | POA: Diagnosis not present

## 2015-02-27 DIAGNOSIS — R7303 Prediabetes: Secondary | ICD-10-CM

## 2015-02-27 DIAGNOSIS — R6889 Other general symptoms and signs: Secondary | ICD-10-CM | POA: Diagnosis not present

## 2015-02-27 DIAGNOSIS — E559 Vitamin D deficiency, unspecified: Secondary | ICD-10-CM | POA: Diagnosis not present

## 2015-02-27 DIAGNOSIS — E782 Mixed hyperlipidemia: Secondary | ICD-10-CM | POA: Diagnosis not present

## 2015-02-27 DIAGNOSIS — K5909 Other constipation: Secondary | ICD-10-CM

## 2015-02-27 DIAGNOSIS — Z0001 Encounter for general adult medical examination with abnormal findings: Secondary | ICD-10-CM | POA: Diagnosis not present

## 2015-02-27 DIAGNOSIS — M4850XS Collapsed vertebra, not elsewhere classified, site unspecified, sequela of fracture: Secondary | ICD-10-CM | POA: Diagnosis not present

## 2015-02-27 DIAGNOSIS — Z0000 Encounter for general adult medical examination without abnormal findings: Secondary | ICD-10-CM

## 2015-02-27 DIAGNOSIS — M5126 Other intervertebral disc displacement, lumbar region: Secondary | ICD-10-CM

## 2015-02-27 DIAGNOSIS — Z79899 Other long term (current) drug therapy: Secondary | ICD-10-CM

## 2015-02-27 DIAGNOSIS — K5900 Constipation, unspecified: Secondary | ICD-10-CM

## 2015-02-27 DIAGNOSIS — M810 Age-related osteoporosis without current pathological fracture: Secondary | ICD-10-CM | POA: Diagnosis not present

## 2015-02-27 DIAGNOSIS — IMO0001 Reserved for inherently not codable concepts without codable children: Secondary | ICD-10-CM

## 2015-02-27 DIAGNOSIS — R7309 Other abnormal glucose: Secondary | ICD-10-CM | POA: Diagnosis not present

## 2015-02-27 DIAGNOSIS — R29898 Other symptoms and signs involving the musculoskeletal system: Secondary | ICD-10-CM

## 2015-02-27 DIAGNOSIS — K222 Esophageal obstruction: Secondary | ICD-10-CM

## 2015-02-27 DIAGNOSIS — Z1231 Encounter for screening mammogram for malignant neoplasm of breast: Secondary | ICD-10-CM

## 2015-02-27 DIAGNOSIS — K219 Gastro-esophageal reflux disease without esophagitis: Secondary | ICD-10-CM

## 2015-02-27 DIAGNOSIS — G894 Chronic pain syndrome: Secondary | ICD-10-CM

## 2015-02-27 LAB — CBC WITH DIFFERENTIAL/PLATELET
BASOS PCT: 0 % (ref 0–1)
Basophils Absolute: 0 10*3/uL (ref 0.0–0.1)
Eosinophils Absolute: 0 10*3/uL (ref 0.0–0.7)
Eosinophils Relative: 0 % (ref 0–5)
HCT: 42.3 % (ref 36.0–46.0)
HEMOGLOBIN: 14.6 g/dL (ref 12.0–15.0)
Lymphocytes Relative: 12 % (ref 12–46)
Lymphs Abs: 1 10*3/uL (ref 0.7–4.0)
MCH: 32.9 pg (ref 26.0–34.0)
MCHC: 34.5 g/dL (ref 30.0–36.0)
MCV: 95.3 fL (ref 78.0–100.0)
MPV: 10 fL (ref 8.6–12.4)
Monocytes Absolute: 0.6 10*3/uL (ref 0.1–1.0)
Monocytes Relative: 7 % (ref 3–12)
NEUTROS ABS: 6.8 10*3/uL (ref 1.7–7.7)
NEUTROS PCT: 81 % — AB (ref 43–77)
Platelets: 327 10*3/uL (ref 150–400)
RBC: 4.44 MIL/uL (ref 3.87–5.11)
RDW: 13.9 % (ref 11.5–15.5)
WBC: 8.4 10*3/uL (ref 4.0–10.5)

## 2015-02-27 LAB — HEMOGLOBIN A1C
HEMOGLOBIN A1C: 6.1 % — AB (ref <5.7)
MEAN PLASMA GLUCOSE: 128 mg/dL — AB (ref <117)

## 2015-02-27 MED ORDER — PANTOPRAZOLE SODIUM 40 MG PO TBEC: 40.0000 mg | DELAYED_RELEASE_TABLET | Freq: Every day | ORAL | Status: DC

## 2015-02-27 NOTE — Patient Instructions (Signed)
Will give samples on linzess to take twice a day for constipation  [facility]  7 a.m.-6:30 p.m., Monday 7 a.m.-5 p.m., Tuesday-Friday Schedule an appointment by calling [phone].  Preventive Care for Adults A healthy lifestyle and preventive care can promote health and wellness. Preventive health guidelines for women include the following key practices.  A routine yearly physical is a good way to check with your health care provider about your health and preventive screening. It is a chance to share any concerns and updates on your health and to receive a thorough exam.  Visit your dentist for a routine exam and preventive care every 6 months. Brush your teeth twice a day and floss once a day. Good oral hygiene prevents tooth decay and gum disease.  The frequency of eye exams is based on your age, health, family medical history, use of contact lenses, and other factors. Follow your health care provider's recommendations for frequency of eye exams.  Eat a healthy diet. Foods like vegetables, fruits, whole grains, low-fat dairy products, and lean protein foods contain the nutrients you need without too many calories. Decrease your intake of foods high in solid fats, added sugars, and salt. Eat the right amount of calories for you.Get information about a proper diet from your health care provider, if necessary.  Regular physical exercise is one of the most important things you can do for your health. Most adults should get at least 150 minutes of moderate-intensity exercise (any activity that increases your heart rate and causes you to sweat) each week. In addition, most adults need muscle-strengthening exercises on 2 or more days a week.  Maintain a healthy weight. The body mass index (BMI) is a screening tool to identify possible weight problems. It provides an estimate of body fat based on height and weight. Your health care provider can find your BMI and can  help you achieve or maintain a healthy weight.For adults 20 years and older:  A BMI below 18.5 is considered underweight.  A BMI of 18.5 to 24.9 is normal.  A BMI of 25 to 29.9 is considered overweight.  A BMI of 30 and above is considered obese.  Maintain normal blood lipids and cholesterol levels by exercising and minimizing your intake of saturated fat. Eat a balanced diet with plenty of fruit and vegetables. If your lipid or cholesterol levels are high, you are over 50, or you are at high risk for heart disease, you may need your cholesterol levels checked more frequently.Ongoing high lipid and cholesterol levels should be treated with medicines if diet and exercise are not working.  If you smoke, find out from your health care provider how to quit. If you do not use tobacco, do not start.  Lung cancer screening is recommended for adults aged 32-80 years who are at high risk for developing lung cancer because of a history of smoking. A yearly low-dose CT scan of the lungs is recommended for people who have at least a 30-pack-year history of smoking and are a current smoker or have quit within the past 15 years. A pack year of smoking is smoking an average of 1 pack of cigarettes a day for 1 year (for example: 1 pack a day for 30 years or 2 packs a day for 15 years). Yearly screening should continue until the smoker has stopped smoking for at least 15 years. Yearly screening should be stopped for people who develop a health problem that would prevent  them from having lung cancer treatment.  Avoid use of street drugs. Do not share needles with anyone. Ask for help if you need support or instructions about stopping the use of drugs.  High blood pressure causes heart disease and increases the risk of stroke.  Ongoing high blood pressure should be treated with medicines if weight loss and exercise do not work.  If you are 32-93 years old, ask your health care provider if you should take aspirin  to prevent strokes.  Diabetes screening involves taking a blood sample to check your fasting blood sugar level. This should be done once every 3 years, after age 87, if you are within normal weight and without risk factors for diabetes. Testing should be considered at a younger age or be carried out more frequently if you are overweight and have at least 1 risk factor for diabetes.  Breast cancer screening is essential preventive care for women. You should practice "breast self-awareness." This means understanding the normal appearance and feel of your breasts and may include breast self-examination. Any changes detected, no matter how small, should be reported to a health care provider. Women in their 25s and 30s should have a clinical breast exam (CBE) by a health care provider as part of a regular health exam every 1 to 3 years. After age 47, women should have a CBE every year. Starting at age 24, women should consider having a mammogram (breast X-ray test) every year. Women who have a family history of breast cancer should talk to their health care provider about genetic screening. Women at a high risk of breast cancer should talk to their health care providers about having an MRI and a mammogram every year.  Breast cancer gene (BRCA)-related cancer risk assessment is recommended for women who have family members with BRCA-related cancers. BRCA-related cancers include breast, ovarian, tubal, and peritoneal cancers. Having family members with these cancers may be associated with an increased risk for harmful changes (mutations) in the breast cancer genes BRCA1 and BRCA2. Results of the assessment will determine the need for genetic counseling and BRCA1 and BRCA2 testing.  Routine pelvic exams to screen for cancer are no longer recommended for nonpregnant women who are considered low risk for cancer of the pelvic organs (ovaries, uterus, and vagina) and who do not have symptoms. Ask your health care  provider if a screening pelvic exam is right for you.  If you have had past treatment for cervical cancer or a condition that could lead to cancer, you need Pap tests and screening for cancer for at least 20 years after your treatment. If Pap tests have been discontinued, your risk factors (such as having a new sexual partner) need to be reassessed to determine if screening should be resumed. Some women have medical problems that increase the chance of getting cervical cancer. In these cases, your health care provider may recommend more frequent screening and Pap tests.    Colorectal cancer can be detected and often prevented. Most routine colorectal cancer screening begins at the age of 28 years and continues through age 65 years. However, your health care provider may recommend screening at an earlier age if you have risk factors for colon cancer. On a yearly basis, your health care provider may provide home test kits to check for hidden blood in the stool. Use of a small camera at the end of a tube, to directly examine the colon (sigmoidoscopy or colonoscopy), can detect the earliest forms of colorectal cancer.  Talk to your health care provider about this at age 47, when routine screening begins. Direct exam of the colon should be repeated every 5-10 years through age 57 years, unless early forms of pre-cancerous polyps or small growths are found.  Osteoporosis is a disease in which the bones lose minerals and strength with aging. This can result in serious bone fractures or breaks. The risk of osteoporosis can be identified using a bone density scan. Women ages 72 years and over and women at risk for fractures or osteoporosis should discuss screening with their health care providers. Ask your health care provider whether you should take a calcium supplement or vitamin D to reduce the rate of osteoporosis.  Menopause can be associated with physical symptoms and risks. Hormone replacement therapy is  available to decrease symptoms and risks. You should talk to your health care provider about whether hormone replacement therapy is right for you.  Use sunscreen. Apply sunscreen liberally and repeatedly throughout the day. You should seek shade when your shadow is shorter than you. Protect yourself by wearing long sleeves, pants, a wide-brimmed hat, and sunglasses year round, whenever you are outdoors.  Once a month, do a whole body skin exam, using a mirror to look at the skin on your back. Tell your health care provider of new moles, moles that have irregular borders, moles that are larger than a pencil eraser, or moles that have changed in shape or color.  Stay current with required vaccines (immunizations).  Influenza vaccine. All adults should be immunized every year.  Tetanus, diphtheria, and acellular pertussis (Td, Tdap) vaccine. Pregnant women should receive 1 dose of Tdap vaccine during each pregnancy. The dose should be obtained regardless of the length of time since the last dose. Immunization is preferred during the 27th-36th week of gestation. An adult who has not previously received Tdap or who does not know her vaccine status should receive 1 dose of Tdap. This initial dose should be followed by tetanus and diphtheria toxoids (Td) booster doses every 10 years. Adults with an unknown or incomplete history of completing a 3-dose immunization series with Td-containing vaccines should begin or complete a primary immunization series including a Tdap dose. Adults should receive a Td booster every 10 years.    Zoster vaccine. One dose is recommended for adults aged 76 years or older unless certain conditions are present.    Pneumococcal 13-valent conjugate (PCV13) vaccine. When indicated, a person who is uncertain of her immunization history and has no record of immunization should receive the PCV13 vaccine. An adult aged 69 years or older who has certain medical conditions and has not  been previously immunized should receive 1 dose of PCV13 vaccine. This PCV13 should be followed with a dose of pneumococcal polysaccharide (PPSV23) vaccine. The PPSV23 vaccine dose should be obtained at least 8 weeks after the dose of PCV13 vaccine. An adult aged 49 years or older who has certain medical conditions and previously received 1 or more doses of PPSV23 vaccine should receive 1 dose of PCV13. The PCV13 vaccine dose should be obtained 1 or more years after the last PPSV23 vaccine dose.    Pneumococcal polysaccharide (PPSV23) vaccine. When PCV13 is also indicated, PCV13 should be obtained first. All adults aged 75 years and older should be immunized. An adult younger than age 52 years who has certain medical conditions should be immunized. Any person who resides in a nursing home or long-term care facility should be immunized. An adult smoker should  be immunized. People with an immunocompromised condition and certain other conditions should receive both PCV13 and PPSV23 vaccines. People with human immunodeficiency virus (HIV) infection should be immunized as soon as possible after diagnosis. Immunization during chemotherapy or radiation therapy should be avoided. Routine use of PPSV23 vaccine is not recommended for American Indians, [place]atives, or people younger than 65 years unless there are medical conditions that require PPSV23 vaccine. When indicated, people who have unknown immunization and have no record of immunization should receive PPSV23 vaccine. One-time revaccination 5 years after the first dose of PPSV23 is recommended for people aged 19-64 years who have chronic kidney failure, nephrotic syndrome, asplenia, or immunocompromised conditions. People who received 1-2 doses of PPSV23 before age 33 years should receive another dose of PPSV23 vaccine at age 85 years or later if at least 5 years have passed since the previous dose. Doses of PPSV23 are not needed for people immunized with  PPSV23 at or after age 42 years.   Preventive Services / Frequency  Ages 64 years and over  Blood pressure check.  Lipid and cholesterol check.  Lung cancer screening. / Every year if you are aged 89-80 years and have a 30-pack-year history of smoking and currently smoke or have quit within the past 15 years. Yearly screening is stopped once you have quit smoking for at least 15 years or develop a health problem that would prevent you from having lung cancer treatment.  Clinical breast exam.** / Every year after age 48 years.  BRCA-related cancer risk assessment.** / For women who have family members with a BRCA-related cancer (breast, ovarian, tubal, or peritoneal cancers).  Mammogram.** / Every year beginning at age 7 years and continuing for as long as you are in good health. Consult with your health care provider.  Pap test.** / Every 3 years starting at age 58 years through age 45 or 40 years with 3 consecutive normal Pap tests. Testing can be stopped between 65 and 70 years with 3 consecutive normal Pap tests and no abnormal Pap or HPV tests in the past 10 years.  Fecal occult blood test (FOBT) of stool. / Every year beginning at age 66 years and continuing until age 69 years. You may not need to do this test if you get a colonoscopy every 10 years.  Flexible sigmoidoscopy or colonoscopy.** / Every 5 years for a flexible sigmoidoscopy or every 10 years for a colonoscopy beginning at age 56 years and continuing until age 79 years.  Hepatitis C blood test.** / For all people born from [birth year] through 1963 and any individual with known risks for hepatitis C.  Osteoporosis screening.** / A one-time screening for women ages 59 years and over and women at risk for fractures or osteoporosis.  Skin self-exam. / Monthly.  Influenza vaccine. / Every year.  Tetanus, diphtheria, and acellular pertussis (Tdap/Td) vaccine.** / 1 dose of Td every 10 years.  Zoster vaccine.** / 1 dose for  adults aged 76 years or older.  Pneumococcal 13-valent conjugate (PCV13) vaccine.** / Consult your health care provider.  Pneumococcal polysaccharide (PPSV23) vaccine.** / 1 dose for all adults aged 87 years and older. Screening for abdominal aortic aneurysm (AAA)  by ultrasound is recommended for people who have history of high blood pressure or who are current or former smokers.

## 2015-02-27 NOTE — Progress Notes (Signed)
MEDICARE ANNUAL WELLNESS VISIT AND FOLLOW UP  Assessment:   1. Essential hypertension - continue medications, DASH diet, exercise and monitor at home. Call if greater than 130/80.  - CBC with Differential/Platelet - BASIC METABOLIC PANEL WITH GFR - Hepatic function panel - TSH  2. Prediabetes Discussed general issues about diabetes pathophysiology and management., Educational material distributed., Suggested low cholesterol diet., Encouraged aerobic exercise., Discussed foot care., Reminded to get yearly retinal exam. - Hemoglobin A1c  3. Mixed hyperlipidemia -continue medications, check lipids, decrease fatty foods, increase activity.  - Lipid panel  4. Coronary artery disease due to lipid rich plaque Control blood pressure, cholesterol, glucose, increase exercise.  - Lipid panel - Hemoglobin A1c  5. Recurrent major depressive disorder, in partial remission (HCC) Remission, negative screen  6. Need for prophylactic vaccination and inoculation against influenza - Flu vaccine HIGH DOSE PF (Fluzone High dose)  7. Atherosclerosis of autologous vein coronary artery bypass graft with angina pectoris ([place]) Control blood pressure, cholesterol, glucose, increase exercise.  - Lipid panel - Hemoglobin A1c  8. Incarcerated recurrent paraesophageal hiatal hernia s/p lap redo repair [id number] Improved, continue PPI  9. Asthma, unspecified asthma severity, uncomplicated controlled  10. Vertebral compression fracture, sequela Continue fosamax, DEXA due 2 years, continue Vit D and Ca, weight bearing exercises  11. Osteoporosis Continue fosamax, DEXA due 2 years, continue Vit D and Ca, weight bearing exercises  12. Herniated lumbar intervertebral disc Continue pain management  13. Vitamin D deficiency - Vit D  25 hydroxy (rtn osteoporosis monitoring)  14. MGUS (monoclonal gammopathy of unknown significance) - CBC with Differential/Platelet  15. Medication management -  Magnesium  16. Chronic pain syndrome Continue pain management  17. Anxiety continue medications, stress management techniques discussed, increase water, good sleep hygiene discussed, increase exercise, and increase veggies.   18. Gastroesophageal reflux disease, esophagitis presence not specified Continue PPI/H2 blocker, diet discussed  19. ESOPHAGEAL STRICTURE improved  20. Constipation, chronic linzess samples given  21. Weakness of both lower extremities Rule out PR, myositis from statin, likely from back, continue pain management.  - CK - Aldolase - Sedimentation rate  22. Medicare visit Get MGM, declines zostavax  Over 30 minutes of exam, counseling, chart review, and critical decision making was performed  Plan:   During the course of the visit the patient was educated and counseled about appropriate screening and preventive services including:    Pneumococcal vaccine   Influenza vaccine  Td vaccine  Prevnar 13  Screening electrocardiogram  Screening mammography  Bone densitometry screening  Colorectal cancer screening  Diabetes screening  Glaucoma screening  Nutrition counseling   Advanced directives: given info/requested copies  Conditions/risks identified: Diabetes is at goal, ACE/ARB therapy: Yes. Urinary Incontinence is not an issue: discussed non pharmacology and pharmacology options.  Fall risk: high- discussed PT, home fall assessment, medications.    Subjective:   [name] [name] is a 79 y.o. female who presents for Medicare Annual Wellness Visit and 3 month follow up on hypertension, prediabetes, hyperlipidemia, vitamin D def.  Date of last medicare wellness visit was 04/2014  Her blood pressure has been controlled at home, today their BP is BP: 120/70 mmHg  She has history of PTCA/stent placed in 2003 and low risk myoview in 2014.  She has a history of COPD/asthma, follows with Dr. Melvyn Novas, on [place], and albuterol.  She has DDD and  osteoporosis with history of vertebral compression fx, followed by Dr. Louanne Skye and Cruzita Lederer. She has history of MGUS with  negative BX. On norco, gabapentin.   She states she has no injury, feels very washed out, her legs have no strength, no falls, walks with cane. She complains of weakness in her legs. She follows with pain management. Can check CPK.  She does not workout due to chronic pain. She denies chest pain, shortness of breath, dizziness.She is on the foxamax 70 x July 2016.   She is on cholesterol medication and denies myalgias. Her cholesterol is at goal. The cholesterol last visit was:   Lab Results  Component Value Date   CHOL 139 11/04/2014   HDL 66.70 11/04/2014   LDLCALC 61 11/04/2014   TRIG 59.0 11/04/2014   CHOLHDL 2 11/04/2014  She has history of Hiatal hernia and is s/p surgery for this, doing better.  She has been working on diet and exercise for prediabetes (x 2010), and denies polydipsia, polyuria and visual disturbances. Last A1C in the office was:  Lab Results  Component Value Date   HGBA1C 5.9* 11/24/2014  Last GFR  Lab Results  Component Value Date   GFRNONAA 71 11/24/2014  Patient is on Vitamin D supplement. Lab Results  Component Value Date   VD25OH 66 11/24/2014      Medication Review Current Outpatient Prescriptions on File Prior to Visit  Medication Sig Dispense Refill  . albuterol (PROVENTIL HFA;VENTOLIN HFA) 108 (90 BASE) MCG/ACT inhaler Inhale 1-2 puffs into the lungs every 6 (six) hours as needed for wheezing or shortness of breath.    [name] alendronate (FOSAMAX) 70 MG tablet Take 70 mg by mouth once a week. Take with a full glass of water on an empty stomach.    . ALPRAZolam (XANAX) 0.5 MG tablet Take 1 tablet (0.5 mg total) by mouth 3 (three) times daily as needed. 90 tablet 0  . amLODipine (NORVASC) 10 MG tablet Take 1 tablet (10 mg total) by mouth at bedtime. 30 tablet 1  . aspirin EC 81 MG tablet Take 1 tablet (81 mg total) by mouth.    .  Cholecalciferol (VITAMIN D3) 2000 UNITS TABS Take 6,000 Units by mouth daily.     . feeding supplement, GLUCERNA SHAKE, (GLUCERNA SHAKE) LIQD Take 237 mLs by mouth 3 (three) times daily between meals. 90 Can 0  . gabapentin (NEURONTIN) 100 MG capsule Take 1 capsule (100 mg total) by mouth 3 (three) times daily. 90 capsule 5  . ipratropium-albuterol (DUONEB) 0.5-2.5 (3) MG/3ML SOLN USE ONE VIAL IN NEBULIZER 4 TIMES DAILY (Patient taking differently: Inhale 3 mLs into the lungs every 6 (six) hours as needed. For shortness of breath) 360 mL 99  . losartan (COZAAR) 100 MG tablet TAKE ONE TABLET BY MOUTH ONCE DAILY (DISCONTINUE  BENICAR) 90 tablet 3  . metoprolol (LOPRESSOR) 50 MG tablet TAKE ONE TABLET BY MOUTH TWICE DAILY FOR BLOOD PRESSURE 180 tablet 1  . mometasone-formoterol (DULERA) 200-5 MCG/ACT AERO Inhale 2 puffs into the lungs 2 (two) times daily. 13 g 3  . nitroGLYCERIN (NITROSTAT) 0.4 MG SL tablet Place 1 tablet (0.4 mg total) under the tongue every 5 (five) minutes as needed for chest pain. 90 tablet 3  . ondansetron (ZOFRAN ODT) 4 MG disintegrating tablet Take 1 tablet (4 mg total) by mouth every 8 (eight) hours as needed for nausea or vomiting. 10 tablet 0  . OVER THE COUNTER MEDICATION Take 1,000 mg by mouth 2 (two) times daily. [place] Calcium 1000mg     . OVER THE COUNTER MEDICATION Take 400 mg by mouth 2 (two) times  daily. Magnesium Carbonate 400mg     . oxyCODONE-acetaminophen (PERCOCET/ROXICET) 5-325 MG per tablet Take 1 tablet by mouth every 4 (four) hours as needed for severe pain.    . polyethylene glycol (MIRALAX / GLYCOLAX) packet Take 17 g by mouth 2 (two) times daily.    . potassium chloride SA (K-DUR,KLOR-CON) 20 MEQ tablet Take 1 tablet (20 mEq total) by mouth 2 (two) times daily. 30 tablet 0  . pravastatin (PRAVACHOL) 40 MG tablet Take 40 mg by mouth daily.    . prochlorperazine (COMPAZINE) 5 MG tablet Take 1 capsule 3 x day before meals only if needed for Nausea 90 tablet 5  .  senna (SENOKOT) 8.6 MG TABS tablet Take 1 tablet by mouth 2 (two) times daily.     . sucralfate (CARAFATE) 1 G tablet TAKE ONE TABLET BY MOUTH 4 TIMES DAILY BEFORE MEALS AND AT BEDTIME FOR HEARTBURN 100 tablet 0   No current facility-administered medications on file prior to visit.    Current Problems (verified) Patient Active Problem List   Diagnosis Date Noted  . Mixed hyperlipidemia 11/24/2014  . GERD  11/24/2014  . Depression, major, in partial remission ([place]) 09/26/2014  . Chronic pain syndrome 09/26/2014  . Coronary artery disease due to lipid rich plaque   . Osteoporosis 02/10/2014  . Prediabetes 08/14/2013  . Vitamin D deficiency 08/14/2013  . Medication management 08/14/2013  . Constipation, chronic 04/03/2013  . Anxiety   . MGUS (monoclonal gammopathy of unknown significance) 03/05/2013  . Herniated lumbar intervertebral disc 06/25/2012    Class: Acute  . Vertebral compression fracture ([place]) 06/23/2012  . ASCAD/CABG 08/28/2008  . ESOPHAGEAL STRICTURE 08/28/2008  . Asthma 07/29/2008  . Incarcerated recurrent paraesophageal hiatal hernia s/p lap redo repair [id number] 01/31/2008  . Essential hypertension 07/25/2007    Screening Tests Immunization History  Administered Date(s) Administered  . DT 05/06/2014  . Influenza Split 02/07/2011  . Influenza Whole 02/09/2009, 01/19/2010, 02/13/2012  . Influenza, High Dose Seasonal PF 03/22/2013, 01/15/2014, 02/27/2015  . Pneumococcal Conjugate-13 05/06/2014  . Pneumococcal Polysaccharide-23 02/09/2011  . Pneumococcal-Unspecified 02/06/2010, 02/09/2012  . Td 05/10/2003    Preventative care: Last colonoscopy: 03/2011 Dr. [name] EGD 08/2013 Last mammogram: 05/2010 DUE Last pap smear/pelvic exam: remote,declines   DEXA 01/2015 CT head 01/2014 Ct AB 11/2013 CT chest 11/2013 Myocardial perfusion 09/2014, previous MI, low risk study Echo 08/2014, EF 55-60%   Prior vaccinations: TD or Tdap: 04/2014  Influenza: 2015  TODAY  Pneumococcal: 2012 Prevnar13:  2015 Shingles/Zostavax: declines due to cost  Names of Other Physician/Practitioners you currently use: 1. [facility]- here for primary care 2. Dr. Einar Gip eye doctor, last visit April 2016 3. No dentist, dentures.   Patient Care Team: [name], MD as PCP - General (Internal Medicine) Inda Castle, MD as Consulting Physician (Gastroenterology) Rigoberto Noel, MD as Consulting Physician (Pulmonary Disease) Larey Dresser, MD as Consulting Physician (Cardiology) Michael Boston, MD as Consulting Physician (General Surgery) Jessy Oto, MD as Consulting Physician (Orthopedic Surgery) Philemon Kingdom, MD as Consulting Physician (Endocrinology) Curt Bears, MD as Consulting Physician (Oncology)  Past Surgical History  Procedure Laterality Date  . Back surgery    . Bladder repair    . Hemorrhoid surgery    . Cholecystectomy      Patient unsure of date.  [name] Appendectomy      patient unsure of date  . Total abdominal hysterectomy  1975    partial  . Coronary angioplasty  11/16/2001  . Eye surgery  11/2000    bilateral cataracts with lens implant  . Hiatal hernia repair  06/03/2011    Procedure: LAPAROSCOPIC REPAIR OF HIATAL HERNIA;  Surgeon: Adin Hector, MD;  Location: [facility];  Service: General;  Laterality: N/A;  . Laparoscopic nissen fundoplication  2/99/3716    Procedure: LAPAROSCOPIC NISSEN FUNDOPLICATION;  Surgeon: Adin Hector, MD;  Location: [facility];  Service: General;  Laterality: N/A;  . Lumbar laminectomy/decompression microdiscectomy Right 06/26/2012    Procedure: LUMBAR LAMINECTOMY/DECOMPRESSION MICRODISCECTOMY;  Surgeon: Jessy Oto, MD;  Location: [facility];  Service: Orthopedics;  Laterality: Right;  RIGHT L4-5 MICRODISCECTOMY  . Esophagogastroduodenoscopy N/A 03/15/2013    Procedure: ESOPHAGOGASTRODUODENOSCOPY (EGD);  Surgeon: Jerene Bears, MD;  Location: [facility];   Service: Endoscopy;  Laterality: N/A;  . Esophageal manometry N/A 03/25/2013    Procedure: ESOPHAGEAL MANOMETRY (EM);  Surgeon: Inda Castle, MD;  Location: [facility];  Service: Endoscopy;  Laterality: N/A;  . Hiatal hernia repair N/A 04/24/2013    Procedure: LAPAROSCOPIC  REPAIR RECURRENT PARASOPHAGEAL HIATAL HERNIA WITH FUNDOPLICATION;  Surgeon: Adin Hector, MD;  Location: [facility];  Service: General;  Laterality: N/A;  . Insertion of mesh N/A 04/24/2013    Procedure: INSERTION OF MESH;  Surgeon: Adin Hector, MD;  Location: [facility];  Service: General;  Laterality: N/A;  . Laparoscopic lysis of adhesions N/A 04/24/2013    Procedure: LAPAROSCOPIC LYSIS OF ADHESIONS;  Surgeon: Adin Hector, MD;  Location: [facility];  Service: General;  Laterality: N/A;  . Fixation kyphoplasty lumbar spine  X 2    "L3-4"   Family History  Problem Relation Age of Onset  . Cancer Mother     colon   . Colon cancer Mother 39  . Heart disease Father 19    heart attack   Social History  Substance Use Topics  . Smoking status: Never Smoker   . Smokeless tobacco: Never Used  . Alcohol Use: No   MEDICARE WELLNESS OBJECTIVES: Tobacco use: She does not smoke.  Patient is not a former smoker. If yes, counseling given Alcohol Current alcohol use: none Osteoporosis: postmenopausal estrogen deficiency and dietary calcium and/or vitamin D deficiency, History of fracture in the past year: yes Fall risk: High Risk Hearing: normal Visual acuity: normal,  does perform annual eye exam Diet: in general, a "healthy" diet   Physical activity: Current Exercise Habits:: Exercise is limited by, Limited by:: orthopedic condition(s) Cardiac risk factors: Cardiac Risk Factors include: advanced age (>48men, >48 women);family history of premature cardiovascular disease;dyslipidemia;hypertension;sedentary lifestyle Depression/mood screen:   Depression screen [facility] 2/9 02/27/2015  Decreased Interest 0  Down, Depressed,  Hopeless 0  PHQ - 2 Score 0  Altered sleeping -  Tired, decreased energy -  Change in appetite -  Feeling bad or failure about yourself  -  Trouble concentrating -  Moving slowly or fidgety/restless -  Suicidal thoughts -  PHQ-9 Score -  Difficult doing work/chores -    ADLs:  In your present state of health, do you have any difficulty performing the following activities: 02/27/2015 09/20/2014  Hearing? Y N  Vision? N N  Difficulty concentrating or making decisions? N N  Walking or climbing stairs? Y N  Dressing or bathing? N N  Doing errands, shopping? N N  Preparing Food and eating ? N -  Using the Toilet? N -  In the past six months, have you accidently leaked urine? N -  Do you  have problems with loss of bowel control? N -  Managing your Medications? N -  Managing your Finances? N -  Housekeeping or managing your Housekeeping? N -     Cognitive Testing  Alert? Yes  Normal Appearance?Yes  Oriented to person? Yes  Place? Yes   Time? Yes  Recall of three objects?  Yes  Can perform simple calculations? Yes  Displays appropriate judgment?Yes  Can read the correct time from a watch face?Yes  EOL planning: Does patient have an advance directive?: Yes Type of Advance Directive: Healthcare Power of Attorney, Living will Does patient want to make changes to advanced directive?: Yes - information given Copy of advanced directive(s) in chart?: No - copy requested   Objective:   Today's Vitals   02/27/15 0843  BP: 120/70  Pulse: 91  Temp: 97.7 F (36.5 C)  TempSrc: Temporal  Resp: 14  Height: 5' 3.5" (1.613 m)  Weight: 136 lb (61.689 kg)  SpO2: 98%   Body mass index is 23.71 kg/(m^2).  General appearance: alert, no distress, WD/WN,  female HEENT: normocephalic, sclerae anicteric, TMs pearly, nares patent, no discharge or erythema, pharynx normal Oral cavity: MMM, no lesions Neck: supple, no lymphadenopathy, no thyromegaly, no masses Heart: RRR, normal S1, S2, no  murmurs Lungs: CTA bilaterally, no wheezes, rhonchi, or rales Abdomen: +bs, soft, non tender, non distended, no masses, no hepatomegaly, no splenomegaly Musculoskeletal: nontender, no swelling, no obvious deformity Extremities: no edema, no cyanosis, no clubbing Pulses: 2+ symmetric, upper and lower extremities, normal cap refill Neurological: alert, oriented x 3, CN2-12 intact, strength 4/5 upper extremities and lower extremities, DTRs 2+ throughout, no cerebellar signs, gait antaglic with cane Psychiatric: normal affect, behavior normal, pleasant  Breast: defer Gyn: defer Rectal: defer   Medicare Attestation I have personally reviewed: The patient's medical and social history Their use of alcohol, tobacco or illicit drugs Their current medications and supplements The patient's functional ability including ADLs,fall risks, home safety risks, cognitive, and hearing and visual impairment Diet and physical activities Evidence for depression or mood disorders  The patient's weight, height, BMI, and visual acuity have been recorded in the chart.  I have made referrals, counseling, and provided education to the patient based on review of the above and I have provided the patient with a written personalized care plan for preventive services.     Vicie Mutters, PA-C   02/27/2015

## 2015-02-28 LAB — BASIC METABOLIC PANEL WITH GFR
BUN: 17 mg/dL (ref 7–25)
CO2: 26 mmol/L (ref 20–31)
Calcium: 9.3 mg/dL (ref 8.6–10.4)
Chloride: 105 mmol/L (ref 98–110)
Creat: 0.72 mg/dL (ref 0.60–0.93)
GFR, Est African American: >89 mL/min (ref >=60)
GFR, Est Non African American: 81 mL/min (ref >=60)
Glucose, Bld: 157 mg/dL — ABNORMAL HIGH (ref 65–99)
Potassium: 4.1 mmol/L (ref 3.5–5.3)
Sodium: 140 mmol/L (ref 135–146)

## 2015-02-28 LAB — MAGNESIUM: MAGNESIUM: 2 mg/dL (ref 1.5–2.5)

## 2015-02-28 LAB — LIPID PANEL
CHOLESTEROL: 142 mg/dL (ref 125–200)
HDL: 72 mg/dL (ref >=46)
LDL CALC: 56 mg/dL (ref <130)
Total CHOL/HDL Ratio: 2 Ratio (ref <=5.0)
Triglycerides: 69 mg/dL (ref <150)
VLDL: 14 mg/dL (ref <30)

## 2015-02-28 LAB — CK: Total CK: 31 U/L (ref 7–177)

## 2015-02-28 LAB — TSH: TSH: 0.839 u[IU]/mL (ref 0.350–4.500)

## 2015-02-28 LAB — HEPATIC FUNCTION PANEL
ALT: 19 U/L (ref 6–29)
AST: 12 U/L (ref 10–35)
Albumin: 3.9 g/dL (ref 3.6–5.1)
Alkaline Phosphatase: 52 U/L (ref 33–130)
Bilirubin, Direct: 0.2 mg/dL (ref <=0.2)
Indirect Bilirubin: 0.5 mg/dL (ref 0.2–1.2)
Total Bilirubin: 0.7 mg/dL (ref 0.2–1.2)
Total Protein: 6.3 g/dL (ref 6.1–8.1)

## 2015-02-28 LAB — SEDIMENTATION RATE: Sed Rate: 1 mm/hr (ref 0–30)

## 2015-02-28 LAB — VITAMIN D 25 HYDROXY (VIT D DEFICIENCY, FRACTURES): Vit D, 25-Hydroxy: 56 ng/mL (ref 30–100)

## 2015-03-01 LAB — ALDOLASE: ALDOLASE: 5.7 U/L (ref <=8.1)

## 2015-03-02 ENCOUNTER — Emergency Department (HOSPITAL_COMMUNITY)
Admission: EM | Admit: 2015-03-02 | Discharge: 2015-03-03 | Disposition: A | Discharge status: Home / Self Care | Payer: Medicare Other | Attending: Emergency Medicine | Admitting: Emergency Medicine

## 2015-03-02 ENCOUNTER — Encounter (HOSPITAL_COMMUNITY): Payer: Self-pay | Admitting: Vascular Surgery

## 2015-03-02 ENCOUNTER — Emergency Department (HOSPITAL_COMMUNITY): Payer: Medicare Other

## 2015-03-02 DIAGNOSIS — K5900 Constipation, unspecified: Secondary | ICD-10-CM | POA: Diagnosis not present

## 2015-03-02 DIAGNOSIS — K2970 Gastritis, unspecified, without bleeding: Secondary | ICD-10-CM | POA: Insufficient documentation

## 2015-03-02 DIAGNOSIS — K219 Gastro-esophageal reflux disease without esophagitis: Secondary | ICD-10-CM | POA: Insufficient documentation

## 2015-03-02 DIAGNOSIS — G8929 Other chronic pain: Secondary | ICD-10-CM | POA: Diagnosis not present

## 2015-03-02 DIAGNOSIS — I10 Essential (primary) hypertension: Secondary | ICD-10-CM | POA: Diagnosis not present

## 2015-03-02 DIAGNOSIS — K222 Esophageal obstruction: Secondary | ICD-10-CM | POA: Diagnosis not present

## 2015-03-02 DIAGNOSIS — M5431 Sciatica, right side: Secondary | ICD-10-CM

## 2015-03-02 DIAGNOSIS — M5441 Lumbago with sciatica, right side: Secondary | ICD-10-CM | POA: Insufficient documentation

## 2015-03-02 DIAGNOSIS — Z8701 Personal history of pneumonia (recurrent): Secondary | ICD-10-CM | POA: Diagnosis not present

## 2015-03-02 DIAGNOSIS — M25551 Pain in right hip: Secondary | ICD-10-CM | POA: Diagnosis present

## 2015-03-02 DIAGNOSIS — Z79899 Other long term (current) drug therapy: Secondary | ICD-10-CM | POA: Insufficient documentation

## 2015-03-02 DIAGNOSIS — F419 Anxiety disorder, unspecified: Secondary | ICD-10-CM | POA: Insufficient documentation

## 2015-03-02 DIAGNOSIS — J45909 Unspecified asthma, uncomplicated: Secondary | ICD-10-CM | POA: Diagnosis not present

## 2015-03-02 DIAGNOSIS — I2510 Atherosclerotic heart disease of native coronary artery without angina pectoris: Secondary | ICD-10-CM | POA: Diagnosis not present

## 2015-03-02 DIAGNOSIS — Z791 Long term (current) use of non-steroidal anti-inflammatories (NSAID): Secondary | ICD-10-CM | POA: Insufficient documentation

## 2015-03-02 DIAGNOSIS — Z9861 Coronary angioplasty status: Secondary | ICD-10-CM | POA: Diagnosis not present

## 2015-03-02 DIAGNOSIS — Z8673 Personal history of transient ischemic attack (TIA), and cerebral infarction without residual deficits: Secondary | ICD-10-CM | POA: Insufficient documentation

## 2015-03-02 DIAGNOSIS — Z862 Personal history of diseases of the blood and blood-forming organs and certain disorders involving the immune mechanism: Secondary | ICD-10-CM | POA: Insufficient documentation

## 2015-03-02 DIAGNOSIS — M160 Bilateral primary osteoarthritis of hip: Secondary | ICD-10-CM | POA: Diagnosis not present

## 2015-03-02 DIAGNOSIS — E785 Hyperlipidemia, unspecified: Secondary | ICD-10-CM | POA: Diagnosis not present

## 2015-03-02 HISTORY — DX: Age-related osteoporosis without current pathological fracture: M81.0

## 2015-03-02 MED ORDER — PREDNISONE 20 MG PO TABS
60.0000 mg | ORAL_TABLET | Freq: Once | ORAL | Status: AC
  Administered 2015-03-02: 60 mg via ORAL
  Filled 2015-03-02: qty 3

## 2015-03-02 MED ORDER — KETOROLAC TROMETHAMINE 30 MG/ML IJ SOLN
30.0000 mg | Freq: Once | INTRAMUSCULAR | Status: AC
  Administered 2015-03-02: 30 mg via INTRAMUSCULAR
  Filled 2015-03-02: qty 1

## 2015-03-02 MED ORDER — ORPHENADRINE CITRATE ER 100 MG PO TB12
100.0000 mg | ORAL_TABLET | Freq: Once | ORAL | Status: AC
  Administered 2015-03-02: 100 mg via ORAL
  Filled 2015-03-02: qty 1

## 2015-03-02 MED ORDER — LIDOCAINE 5 % EX PTCH
1.0000 | MEDICATED_PATCH | CUTANEOUS | Status: DC
  Administered 2015-03-02: 1 via TRANSDERMAL
  Filled 2015-03-02: qty 1

## 2015-03-02 NOTE — ED Notes (Addendum)
Pt reports to the ED for eval of right hip pain. Reports the pain radiates down into her right leg x 2 days. Denies any known injury. Pt sees pain management for osteoporosis. She took a Percocet 5-325 but reports it did not help. Pt reports some numbness/tingling intermittently in right hip and down leg. Pt tearful in triage and moaning. States it hurts so bad she is getting nauseated. Pt A&Ox4, resp e/u, and skin warm and dry.

## 2015-03-02 NOTE — ED Notes (Signed)
Attempted to give protocol percocet however, pt already on this and states it is not helping.

## 2015-03-02 NOTE — ED Notes (Signed)
Patient came to window, and asked about wait. Informed patient that She was near the top to be moved to an exam room.

## 2015-03-02 NOTE — ED Provider Notes (Signed)
CSN: [id number]     Arrival date & time 03/02/15  1731 History   First MD Initiated Contact with Patient 03/02/15 2209     Chief Complaint  Patient presents with  . Hip Pain     (Consider location/radiation/quality/duration/timing/severity/associated sxs/prior Treatment) HPI Comments:  This 79 year old female, history of sciatica.  She's also had lumbar fusion surgery in the past, but is not a candidate for any further surgery due to osteoporosis.  She does see a pain specialist and did receive an epidural injection, October 5 without any significant relief in her discomfort.  She does report that she is taking Percocet on a regular basis without much relief.  She also states that she is constipated despite taking over-the-counter stool softener  The history is provided by the patient.    Past Medical History  Diagnosis Date  . CAD (coronary artery disease)     LHC 2003 with 40% pLAD, 30% mLAD, 100% D1 (moderate vessel), 100%  D2 (small vessel), 95% mid small OM2.  Pt had PTCA to D1;  D2 and OM2 small vessels and tx medically;  Last Myoview (2012): anterior infarct seen with scar, no ischemia. EF normal. Pt managed medically;  Lexiscan Myoview (12/14):  Low risk; ant scar with very mild peri-infarct ischemia; EF 55% with ant AK   . Recurrent aspiration bronchitis/pneumonia ([place])     PFTs 12/09 FVC 97%, FEV1 101%, ratio 77%, TLC 109%. No significant obstruction or restriction.   . Hypertension   . Hyperlipidemia   . Esophageal stricture   . Gastritis   . GERD (gastroesophageal reflux disease)   . Asthma   . Blood transfusion   . Cough   . Wheezing   . Weakness   . Trouble swallowing   . Abdominal pain   . Hiatal hernia   . Shortness of breath   . Anxiety   . TIA (transient ischemic attack)   . BRONCHITIS, ACUTE 07/25/2007         . DIVERTICULOSIS, COLON 08/28/2008    Qualifier: Diagnosis of  By: [name], [name], [name]    . IRON DEFICIENCY ANEMIA, HX OF 07/24/2007    Qualifier:  Diagnosis of  By: Tilden Dome    . Chronic nausea   . Chronic back pain   . PONV (postoperative nausea and vomiting)   . CAD (coronary artery disease)   . Osteoporosis    Past Surgical History  Procedure Laterality Date  . Back surgery    . Bladder repair    . Hemorrhoid surgery    . Cholecystectomy      Patient unsure of date.  [name] Appendectomy      patient unsure of date  . Total abdominal hysterectomy  1975    partial  . Coronary angioplasty  11/16/2001  . Eye surgery  11/2000    bilateral cataracts with lens implant  . Hiatal hernia repair  06/03/2011    Procedure: LAPAROSCOPIC REPAIR OF HIATAL HERNIA;  Surgeon: Adin Hector, MD;  Location: [facility];  Service: General;  Laterality: N/A;  . Laparoscopic nissen fundoplication  2/69/4854    Procedure: LAPAROSCOPIC NISSEN FUNDOPLICATION;  Surgeon: Adin Hector, MD;  Location: [facility];  Service: General;  Laterality: N/A;  . Lumbar laminectomy/decompression microdiscectomy Right 06/26/2012    Procedure: LUMBAR LAMINECTOMY/DECOMPRESSION MICRODISCECTOMY;  Surgeon: Jessy Oto, MD;  Location: [place];  Service: Orthopedics;  Laterality: Right;  RIGHT L4-5 MICRODISCECTOMY  . Esophagogastroduodenoscopy N/A 03/15/2013    Procedure: ESOPHAGOGASTRODUODENOSCOPY (EGD);  Surgeon: Jerene Bears, MD;  Location[facility];  Service: Endoscopy;  Laterality: N/A;  . Esophageal manometry N/A 03/25/2013    Procedure: ESOPHAGEAL MANOMETRY (EM);  Surgeon: Inda Castle, MD;  Location: [facility];  Service: Endoscopy;  Laterality: N/A;  . Hiatal hernia repair N/A 04/24/2013    Procedure: LAPAROSCOPIC  REPAIR RECURRENT PARASOPHAGEAL HIATAL HERNIA WITH FUNDOPLICATION;  Surgeon: Adin Hector, MD;  Location: [facility];  Service: General;  Laterality: N/A;  . Insertion of mesh N/A 04/24/2013    Procedure: INSERTION OF MESH;  Surgeon: Adin Hector, MD;  Location: [facility];  Service: General;  Laterality: N/A;  . Laparoscopic lysis of adhesions N/A  04/24/2013    Procedure: LAPAROSCOPIC LYSIS OF ADHESIONS;  Surgeon: Adin Hector, MD;  Location: [facility];  Service: General;  Laterality: N/A;  . Fixation kyphoplasty lumbar spine  X 2    "L3-4"   Family History  Problem Relation Age of Onset  . Cancer Mother     colon   . Colon cancer Mother 26  . Heart disease Father 71    heart attack   Social History  Substance Use Topics  . Smoking status: Never Smoker   . Smokeless tobacco: Never Used  . Alcohol Use: No   OB History    No data available     Review of Systems  Constitutional: Negative for fever.  Gastrointestinal: Positive for constipation. Negative for abdominal pain.  Genitourinary: Negative for dysuria.  Musculoskeletal: Positive for back pain.  Neurological: Negative for dizziness and numbness.  All other systems reviewed and are negative.     Allergies  Aspartame; Daliresp; Levofloxacin; Shellfish allergy; Tramadol; Atorvastatin; Ace inhibitors; Biaxin; Erythromycin; and Hydrocodone  Home Medications   Prior to Admission medications   Medication Sig Start Date End Date Taking? Authorizing Provider  albuterol (PROVENTIL HFA;VENTOLIN HFA) 108 (90 BASE) MCG/ACT inhaler Inhale 1-2 puffs into the lungs every 6 (six) hours as needed for wheezing or shortness of breath.   Yes Historical Provider, MD  alendronate (FOSAMAX) 70 MG tablet Take 70 mg by mouth once a week. Take with a full glass of water on an empty stomach. Take on Mondays   Yes Historical Provider, MD  ALPRAZolam Duanne Moron) 0.5 MG tablet Take 1 tablet (0.5 mg total) by mouth 3 (three) times daily as needed. Patient taking differently: Take 0.5 mg by mouth 3 (three) times daily as needed for anxiety.  02/25/15  Yes [name], MD  amLODipine (NORVASC) 10 MG tablet Take 1 tablet (10 mg total) by mouth at bedtime. 09/20/14  Yes Reyne Dumas, MD  aspirin EC 81 MG tablet Take 1 tablet (81 mg total) by mouth. 09/04/14  Yes Larey Dresser, MD    Cholecalciferol (VITAMIN D3) 2000 UNITS TABS Take 6,000 Units by mouth daily.    Yes Historical Provider, MD  ipratropium-albuterol (DUONEB) 0.5-2.5 (3) MG/3ML SOLN USE ONE VIAL IN NEBULIZER 4 TIMES DAILY Patient taking differently: Inhale 3 mLs into the lungs every 6 (six) hours as needed. For shortness of breath 02/26/14  Yes [name], MD  losartan (COZAAR) 100 MG tablet TAKE ONE TABLET BY MOUTH ONCE DAILY (DISCONTINUE  BENICAR) 09/29/14  Yes [name], MD  metoprolol (LOPRESSOR) 50 MG tablet TAKE ONE TABLET BY MOUTH TWICE DAILY FOR BLOOD PRESSURE 02/27/14  Yes [name], MD  mometasone-formoterol [facility]) 200-5 MCG/ACT AERO Inhale 2 puffs into the lungs 2 (two) times daily. 07/03/13  Yes Kelby Aline, PA-C  nitroGLYCERIN (NITROSTAT)  0.4 MG SL tablet Place 1 tablet (0.4 mg total) under the tongue every 5 (five) minutes as needed for chest pain. 09/04/14  Yes Larey Dresser, MD  ondansetron (ZOFRAN ODT) 4 MG disintegrating tablet Take 1 tablet (4 mg total) by mouth every 8 (eight) hours as needed for nausea or vomiting. 04/23/14  Yes Carlisle Cater, PA-C  OVER THE COUNTER MEDICATION Take 1,000 mg by mouth 2 (two) times daily. [place] Calcium 1000mg    Yes Historical Provider, MD  OVER THE COUNTER MEDICATION Take 400 mg by mouth 2 (two) times daily. Magnesium Carbonate 400mg    Yes Historical Provider, MD  oxyCODONE-acetaminophen (PERCOCET/ROXICET) 5-325 MG per tablet Take 1 tablet by mouth every 4 (four) hours as needed for severe pain.   Yes Historical Provider, MD  pantoprazole (PROTONIX) 40 MG tablet Take 1 tablet (40 mg total) by mouth daily. 02/27/15  Yes Vicie Mutters, PA-C  polyethylene glycol (MIRALAX / GLYCOLAX) packet Take 17 g by mouth 2 (two) times daily.   Yes Historical Provider, MD  potassium chloride SA (K-DUR,KLOR-CON) 20 MEQ tablet Take 1 tablet (20 mEq total) by mouth 2 (two) times daily. 11/25/14  Yes [name], MD  pravastatin (PRAVACHOL) 40 MG tablet Take 20  mg by mouth daily.    Yes Historical Provider, MD  senna (SENOKOT) 8.6 MG TABS tablet Take 1 tablet by mouth 2 (two) times daily.  12/05/13  Yes Shanker Kristeen Mans, MD  silodosin (RAPAFLO) 8 MG CAPS capsule Take 8 mg by mouth daily with breakfast.   Yes Historical Provider, MD  sucralfate (CARAFATE) 1 G tablet TAKE ONE TABLET BY MOUTH 4 TIMES DAILY BEFORE MEALS AND AT BEDTIME FOR HEARTBURN 10/13/14  Yes Vicie Mutters, PA-C  docusate sodium (COLACE) 250 MG capsule Take 1 capsule (250 mg total) by mouth 2 (two) times daily. 03/03/15   Junius Creamer, NP  feeding supplement, GLUCERNA SHAKE, (GLUCERNA SHAKE) LIQD Take 237 mLs by mouth 3 (three) times daily between meals. Patient not taking: Reported on 03/02/2015 12/05/13   Jonetta Osgood, MD  gabapentin (NEURONTIN) 100 MG capsule Take 1 capsule (100 mg total) by mouth 3 (three) times daily. Patient not taking: Reported on 03/02/2015 09/16/14 03/19/15  [name], MD  lidocaine (LIDODERM) 5 % Place 1 patch onto the skin daily. Remove & Discard patch within 12 hours or as directed by MD 03/03/15   Junius Creamer, NP  orphenadrine (NORFLEX) 100 MG tablet Take 1 tablet (100 mg total) by mouth 2 (two) times daily. 03/03/15   Junius Creamer, NP  predniSONE (DELTASONE) 20 MG tablet 3 Tabs PO Days 1-3, then 2 tabs PO Days 4-6, then 1 tab PO Day 7-9, then Half Tab PO Day 10-12 03/03/15   Junius Creamer, NP  prochlorperazine (COMPAZINE) 5 MG tablet Take 1 capsule 3 x day before meals only if needed for Nausea Patient not taking: Reported on 03/02/2015 09/26/14   [name], MD   BP 181/74 mmHg  Pulse 84  Temp(Src) 98 F (36.7 C)  Resp 20  SpO2 97% Physical Exam  Constitutional: She appears well-developed and well-nourished.  HENT:  Head: Normocephalic.  Eyes: Pupils are equal, round, and reactive to light.  Neck: Normal range of motion.  Cardiovascular: Normal rate and regular rhythm.   Pulmonary/Chest: Effort normal.  Musculoskeletal: She exhibits  tenderness.       Back:  Neurological: She is alert.  Skin: Skin is warm and dry. No rash noted.  Nursing note and vitals reviewed.  ED Course  Procedures (including critical care time) Labs Review Labs Reviewed - No data to display  Imaging Review Dg Hip Unilat  With Pelvis 2-3 Views Right  03/02/2015  CLINICAL DATA:  79 year old female with right-sided hip pain for the past 3 days. EXAM: DG HIP (WITH OR WITHOUT PELVIS) 2-3V RIGHT COMPARISON:  No priors. FINDINGS: AP view of the pelvis demonstrates no acute displaced fracture of the bony pelvic ring. Bilateral proximal femurs as visualized appear intact, and the right femoral head is located. Moderate degenerative changes of osteoarthritis in the hip joints bilaterally. IMPRESSION: 1. No acute radiographic abnormality of the right hip. 2. Moderate bilateral hip joint osteoarthritis. Electronically Signed   By: Vinnie Langton M.D.   On: 03/02/2015 18:52   I have personally reviewed and evaluated these images and lab results as part of my medical decision-making.   EKG Interpretation None     had a long discussion with the patient about sciatica and its treatment. I will prescribe Colace twice a day for her constipation.  I will also add a muscle relaxer and a 12 day steroid taper for symptom relief.  I encouraged the patient to keep her appointment with pain clinic on Monday as scheduled, as well as calling to see if she can see her orthopedist sooner than December 18. I have given her recommendations and sleeping positions to avoid sitting for any significant period of time, lying or standing or better positions for her particular condition  Patient was given muscle relaxer, a lidocaine patch first dose of steroids, and IM Toradol and at time of discharge, her pain had decreased from a 10-7 and she states she feels much better MDM   Final diagnoses:  Sciatica associated with disorder of lumbar spine, right         Junius Creamer, NP 03/03/15 0016  Junius Creamer, NP 03/03/15 7741  Nat Christen, MD 03/05/15 1407

## 2015-03-02 NOTE — ED Notes (Signed)
Pt from home for eval of increase in right hip pain, pt states currently sees pain management and orthopedic for this but reports percocet rx isn't helping. Pt denies any bladder or bowel incontinence. No neuro deficits or sensory loss at this time. Alert and oriented, skin warm and dry.

## 2015-03-03 MED ORDER — PREDNISONE 20 MG PO TABS: ORAL_TABLET | ORAL | Status: DC

## 2015-03-03 MED ORDER — DOCUSATE SODIUM 250 MG PO CAPS: 250.0000 mg | ORAL_CAPSULE | Freq: Two times a day (BID) | ORAL | Status: DC

## 2015-03-03 MED ORDER — ORPHENADRINE CITRATE ER 100 MG PO TB12: 100.0000 mg | ORAL_TABLET | Freq: Two times a day (BID) | ORAL | Status: DC

## 2015-03-03 MED ORDER — LIDOCAINE 5 % EX PTCH: 1.0000 | MEDICATED_PATCH | CUTANEOUS | Status: DC

## 2015-03-03 NOTE — Discharge Instructions (Signed)
U been given medication to help your sciatic pain a steroid taper over the next 12 days.  Please take this as directed.  You've also been given a muscle relaxer called Norflex it to twice a day, medication please uses carefully as it can cause some dizziness especially when changing positions at night.  You've also been given a prescription for Colace.  Please take one tablet twice a day until you stools become soft and then back off to one tablet daily or every other day.  You can continue taking her Percocet as needed for severe pain.  You also been given a Lidoderm patch.  Please places over the lower back once a day, leave in place for 12 hours and then remove. Keep your point with pain management on Monday as scheduled and call to see if you can be seen by Dr. Louanne Skye earlier than December Also, remember that you should not use in a sitting position for long periods of time as this will aggravate the sciatic nerve when sleeping.  Make sure to use a pillow under your right leg to help elevated in a normal position.

## 2015-03-09 ENCOUNTER — Telehealth: Payer: Self-pay | Admitting: Internal Medicine

## 2015-03-09 NOTE — Telephone Encounter (Signed)
Patient called Requesting Referral to another Pain Management Doctor. Per Dr. Melford Aase, we don't have anyone else to refer patient to, she can call around and see if another facility will see her and we will fax referral information.   Thank you, Leonie Douglas Referral Coordinator  [facility] [phone] [phone] Fax [phone]

## 2015-03-10 DIAGNOSIS — G894 Chronic pain syndrome: Secondary | ICD-10-CM | POA: Diagnosis not present

## 2015-03-10 DIAGNOSIS — M5137 Other intervertebral disc degeneration, lumbosacral region: Secondary | ICD-10-CM | POA: Diagnosis not present

## 2015-03-10 DIAGNOSIS — M5417 Radiculopathy, lumbosacral region: Secondary | ICD-10-CM | POA: Diagnosis not present

## 2015-03-10 DIAGNOSIS — Z79899 Other long term (current) drug therapy: Secondary | ICD-10-CM | POA: Diagnosis not present

## 2015-03-16 DIAGNOSIS — M5441 Lumbago with sciatica, right side: Secondary | ICD-10-CM | POA: Diagnosis not present

## 2015-03-16 DIAGNOSIS — M5431 Sciatica, right side: Secondary | ICD-10-CM | POA: Diagnosis not present

## 2015-03-18 ENCOUNTER — Other Ambulatory Visit: Payer: Self-pay | Admitting: *Deleted

## 2015-03-18 MED ORDER — PANTOPRAZOLE SODIUM 40 MG PO TBEC: 40.0000 mg | DELAYED_RELEASE_TABLET | Freq: Every day | ORAL | Status: DC

## 2015-03-20 ENCOUNTER — Other Ambulatory Visit: Payer: Self-pay | Admitting: Specialist

## 2015-03-20 DIAGNOSIS — M545 Low back pain: Secondary | ICD-10-CM

## 2015-03-23 DIAGNOSIS — R351 Nocturia: Secondary | ICD-10-CM | POA: Diagnosis not present

## 2015-03-23 DIAGNOSIS — R3912 Poor urinary stream: Secondary | ICD-10-CM | POA: Diagnosis not present

## 2015-03-31 ENCOUNTER — Encounter: Payer: Self-pay | Admitting: Internal Medicine

## 2015-03-31 ENCOUNTER — Ambulatory Visit (INDEPENDENT_AMBULATORY_CARE_PROVIDER_SITE_OTHER): Payer: Medicare Other | Admitting: Internal Medicine

## 2015-03-31 VITALS — BP 144/70 | HR 92 | Temp 98.0°F | Resp 20 | Ht 63.5 in | Wt 137.0 lb

## 2015-03-31 DIAGNOSIS — H00016 Hordeolum externum left eye, unspecified eyelid: Secondary | ICD-10-CM

## 2015-03-31 MED ORDER — TOBRAMYCIN 0.3 % OP OINT: TOPICAL_OINTMENT | Freq: Two times a day (BID) | OPHTHALMIC | Status: DC

## 2015-03-31 NOTE — Progress Notes (Signed)
   Subjective:    Patient ID: [name] [name], female    DOB: 1935-06-20, 79 y.o.   MRN: [id number]  HPI  Patient presents to the office for evaluation of redness and swelling of the eye lid.  This has been going on for several days.  She reports that it is not painful.  She reports that she has not had any vision problems, discharge, tearing.  No eye ball pain.  She has been using some warm compresses which are helping.  She is unsure whether this is a stye or not.    Review of Systems  Constitutional: Negative for fever, chills and fatigue.  HENT: Negative for congestion, ear pain, postnasal drip, sinus pressure, sneezing, tinnitus and voice change.   Respiratory: Negative for cough.        Objective:   Physical Exam  Constitutional: She is oriented to person, place, and time. She appears well-developed and well-nourished. No distress.  HENT:  Head: Normocephalic.  Mouth/Throat: Oropharynx is clear and moist. No oropharyngeal exudate.  Eyes: Conjunctivae and EOM are normal. Pupils are equal, round, and reactive to light. Right eye exhibits no discharge. Left eye exhibits no discharge. No scleral icterus.    Cardiovascular: Normal rate, regular rhythm, normal heart sounds and intact distal pulses.  Exam reveals no gallop and no friction rub.   No murmur heard. Pulmonary/Chest: Effort normal. No respiratory distress. She has no wheezes. She has no rales. She exhibits no tenderness.  Musculoskeletal: Normal range of motion.  Neurological: She is alert and oriented to person, place, and time.  Skin: Skin is warm and dry. She is not diaphoretic.  Psychiatric: She has a normal mood and affect. Her behavior is normal. Judgment and thought content normal.  Nursing note and vitals reviewed.         Assessment & Plan:    1. Hordeolum externum (stye), left -tobramycin oinment -warm compresses -if no better in 1 week referral to opthalmology -patient given strong return  precautions.

## 2015-03-31 NOTE — Patient Instructions (Signed)
Stye A stye is a bump on your eyelid caused by a bacterial infection. A stye can form inside the eyelid (internal stye) or outside the eyelid (external stye). An internal stye may be caused by an infected oil-producing gland inside your eyelid. An external stye may be caused by an infection at the base of your eyelash (hair follicle). Styes are very common. Anyone can get them at any age. They usually occur in just one eye, but you may have more than one in either eye.  CAUSES  The infection is almost always caused by bacteria called Staphylococcus aureus. This is a common type of bacteria that lives on your skin. RISK FACTORS You may be at higher risk for a stye if you have had one before. You may also be at higher risk if you have:  Diabetes.  Long-term illness.  Long-term eye redness.  A skin condition called seborrhea.  High fat levels in your blood (lipids). SIGNS AND SYMPTOMS  Eyelid pain is the most common symptom of a stye. Internal styes are more painful than external styes. Other signs and symptoms may include:  Painful swelling of your eyelid.  A scratchy feeling in your eye.  Tearing and redness of your eye.  Pus draining from the stye. DIAGNOSIS  Your health care provider may be able to diagnose a stye just by examining your eye. The health care provider may also check to make sure:  You do not have a fever or other signs of a more serious infection.  The infection has not spread to other parts of your eye or areas around your eye. TREATMENT  Most styes will clear up in a few days without treatment. In some cases, you may need to use antibiotic drops or ointment to prevent infection. Your health care provider may have to drain the stye surgically if your stye is:  Large.  Causing a lot of pain.  Interfering with your vision. This can be done using a thin blade or a needle.  HOME CARE INSTRUCTIONS   Take medicines only as directed by your health care  provider.  Apply a clean, warm compress to your eye for 10 minutes, 4 times a day.  Do not wear contact lenses or eye makeup until your stye has healed.  Do not try to pop or drain the stye. SEEK MEDICAL CARE IF:  You have chills or a fever.  Your stye does not go away after several days.  Your stye affects your vision.  Your eyeball becomes swollen, red, or painful. MAKE SURE YOU:  Understand these instructions.  Will watch your condition.  Will get help right away if you are not doing well or get worse.   This information is not intended to replace advice given to you by your health care provider. Make sure you discuss any questions you have with your health care provider.   Document Released: 02/02/2005 Document Revised: 05/16/2014 Document Reviewed: 08/09/2013 Elsevier Interactive Patient Education 2016 [employer]  

## 2015-04-01 ENCOUNTER — Ambulatory Visit
Admission: RE | Admit: 2015-04-01 | Discharge: 2015-04-01 | Disposition: A | Discharge status: Home / Self Care | Payer: Medicare Other | Source: Ambulatory Visit

## 2015-04-01 DIAGNOSIS — Z1231 Encounter for screening mammogram for malignant neoplasm of breast: Secondary | ICD-10-CM | POA: Diagnosis not present

## 2015-04-03 ENCOUNTER — Other Ambulatory Visit: Payer: Self-pay | Admitting: Internal Medicine

## 2015-04-06 ENCOUNTER — Other Ambulatory Visit: Payer: Self-pay | Admitting: Internal Medicine

## 2015-04-06 MED ORDER — ERYTHROMYCIN 5 MG/GM OP OINT: TOPICAL_OINTMENT | Freq: Three times a day (TID) | OPHTHALMIC | Status: AC

## 2015-04-06 NOTE — Progress Notes (Signed)
Patient aware.

## 2015-04-07 ENCOUNTER — Other Ambulatory Visit: Payer: Self-pay | Admitting: Internal Medicine

## 2015-04-08 ENCOUNTER — Ambulatory Visit
Admission: RE | Admit: 2015-04-08 | Discharge: 2015-04-08 | Disposition: A | Discharge status: Home / Self Care | Payer: Medicare Other | Source: Ambulatory Visit | Attending: Specialist | Admitting: Specialist

## 2015-04-08 DIAGNOSIS — M545 Low back pain: Secondary | ICD-10-CM

## 2015-04-08 DIAGNOSIS — M5126 Other intervertebral disc displacement, lumbar region: Secondary | ICD-10-CM | POA: Diagnosis not present

## 2015-04-08 MED ORDER — GADOBENATE DIMEGLUMINE 529 MG/ML IV SOLN
10.0000 mL | Freq: Once | INTRAVENOUS | Status: AC | PRN
  Administered 2015-04-08: 10 mL via INTRAVENOUS

## 2015-04-09 DIAGNOSIS — M170 Bilateral primary osteoarthritis of knee: Secondary | ICD-10-CM | POA: Diagnosis not present

## 2015-04-09 DIAGNOSIS — M5431 Sciatica, right side: Secondary | ICD-10-CM | POA: Diagnosis not present

## 2015-04-09 DIAGNOSIS — M5441 Lumbago with sciatica, right side: Secondary | ICD-10-CM | POA: Diagnosis not present

## 2015-04-09 DIAGNOSIS — M5136 Other intervertebral disc degeneration, lumbar region: Secondary | ICD-10-CM | POA: Diagnosis not present

## 2015-04-09 DIAGNOSIS — M4307 Spondylolysis, lumbosacral region: Secondary | ICD-10-CM | POA: Diagnosis not present

## 2015-04-10 ENCOUNTER — Other Ambulatory Visit: Payer: Self-pay | Admitting: *Deleted

## 2015-04-10 ENCOUNTER — Other Ambulatory Visit: Payer: Self-pay | Admitting: Internal Medicine

## 2015-04-17 DIAGNOSIS — H01022 Squamous blepharitis right lower eyelid: Secondary | ICD-10-CM | POA: Diagnosis not present

## 2015-04-17 DIAGNOSIS — H01025 Squamous blepharitis left lower eyelid: Secondary | ICD-10-CM | POA: Diagnosis not present

## 2015-04-17 DIAGNOSIS — H00014 Hordeolum externum left upper eyelid: Secondary | ICD-10-CM | POA: Diagnosis not present

## 2015-04-17 DIAGNOSIS — H01024 Squamous blepharitis left upper eyelid: Secondary | ICD-10-CM | POA: Diagnosis not present

## 2015-04-17 DIAGNOSIS — H01021 Squamous blepharitis right upper eyelid: Secondary | ICD-10-CM | POA: Diagnosis not present

## 2015-04-21 ENCOUNTER — Ambulatory Visit (INDEPENDENT_AMBULATORY_CARE_PROVIDER_SITE_OTHER): Payer: Medicare Other | Admitting: Internal Medicine

## 2015-04-21 ENCOUNTER — Encounter: Payer: Self-pay | Admitting: Internal Medicine

## 2015-04-21 VITALS — BP 102/56 | HR 92 | Temp 97.0°F | Resp 16 | Ht 63.5 in | Wt 140.2 lb

## 2015-04-21 DIAGNOSIS — I25710 Atherosclerosis of autologous vein coronary artery bypass graft(s) with unstable angina pectoris: Secondary | ICD-10-CM | POA: Diagnosis not present

## 2015-04-21 DIAGNOSIS — R7303 Prediabetes: Secondary | ICD-10-CM

## 2015-04-21 DIAGNOSIS — I10 Essential (primary) hypertension: Secondary | ICD-10-CM | POA: Diagnosis not present

## 2015-04-21 DIAGNOSIS — G894 Chronic pain syndrome: Secondary | ICD-10-CM

## 2015-04-21 DIAGNOSIS — J4520 Mild intermittent asthma, uncomplicated: Secondary | ICD-10-CM | POA: Diagnosis not present

## 2015-04-21 DIAGNOSIS — E559 Vitamin D deficiency, unspecified: Secondary | ICD-10-CM | POA: Diagnosis not present

## 2015-04-21 DIAGNOSIS — K219 Gastro-esophageal reflux disease without esophagitis: Secondary | ICD-10-CM | POA: Diagnosis not present

## 2015-04-21 DIAGNOSIS — M5136 Other intervertebral disc degeneration, lumbar region: Secondary | ICD-10-CM

## 2015-04-21 DIAGNOSIS — E782 Mixed hyperlipidemia: Secondary | ICD-10-CM

## 2015-04-21 NOTE — Progress Notes (Signed)
Patient ID: [name] [name], female   DOB: [birth year], 79 y.o.   MRN: [id number]   This very nice 79 y.o. MWF presents for 3 month follow up with Hypertension, Hyperlipidemia, Pre-Diabetes and Vitamin D Deficiency. Patient presents for Pre-Op evaluation anticipating a R L2/L3 Microdiscectomy by Dr Louanne Skye.   Patient is treated for HTN & BP has been controlled at home. Today's BP is 102/56.  Patient has stable ASCAD with hx/o PTCA in 2013 & had a negative Cardiolite in May 2016. 2D EC had EF 60%. Patient has had no complaints of any cardiac type chest pain, palpitations, dyspnea/orthopnea/PND, dizziness, claudication, or dependent edema. Patient has hx/o recurrent bronchitis & Asthma  And this has been quiescent and stable off of her inhalers for months.    Hyperlipidemia is controlled with diet & meds. Patient denies myalgias or other med SE's. Last Lipids were at goal with Cholesterol 142; HDL 72; LDL 56; Triglycerides 69 on 02/27/2015.   Also, the patient has history of PreDiabetes and has had no symptoms of reactive hypoglycemia, diabetic polys, paresthesias or visual blurring.  Last A1c was 6.1% on 02/27/2015.    Further, the patient also has history of Vitamin D Deficiency of 25 in 2008 and supplements vitamin D without any suspected side-effects. Last vitamin D was 56 on 02/27/2015.   Medication Sig  . albuterol HFA inhaler Inhale 1-2 puffs into the lungs every 6 (six) hours as needed for wheezing or shortness of breath.  [name] alendronate (FOSAMAX) 70 MG tablet Take 70 mg by mouth once a week. Take with a full glass of water on an empty stomach. Take on Mondays  . ALPRAZolam 0.5 MG tablet Take 0.5 mg by mouth 3 (three) times daily as needed for anxiety. )  . amLODipine  10 MG tablet Take 1 tablet (10 mg total) by mouth at bedtime.  [name] aspirin EC 81 MG tablet Take 1 tablet (81 mg total) by mouth.  [name] VITAMIN D 2000 UNITS TABS Take 6,000 Units by mouth daily.   [name] COLACE 250 MG capsule Take 1 capsule  (250 mg total) by mouth 2 (two) times daily.  [name] GLUCERNA SHAKE Take 237 mLs by mouth 3 (three) times daily between meals.  . DUONEB 0.5-2.5 (3) MG/3ML SOLN USE ONE VIAL IN NEBULIZER 4 TIMES DAILY   . lidocaine (LIDODERM) 5 % Place 1 patch onto the skin daily. Remove & Discard patch within 12 hours or as directed by MD  . losartan100 MG tablet TAKE ONE TABLET BY MOUTH ONCE DAILY (DISCONTINUE  BENICAR)  . metFORMIN-XR 500 MG 24 hr tablet TAKE ONE TO TWO TABLETS BY MOUTH TWICE DAILY WITH MEALS FOR DIABETES  . metoprolol  50 MG tablet TAKE ONE TABLET BY MOUTH TWICE DAILY FOR BLOOD PRESSURE  . DULERA 200-5 MCG/ACT AERO Inhale 2 puffs into the lungs 2 (two) times daily.  [name] NITROSTAT 0.4 MG SL tablet Place 1 tablet (0.4 mg total) under the tongue every 5 (five) minutes as needed for chest pain.  [name] ondansetron-ODT 8 MG  DISSOLVE ONE TABLET IN MOUTH EVERY 8 HOURS AS NEEDED FOR NAUSEA AND VOMITING  . orphenadrine (NORFLEX) 100 MG tablet Take 1 tablet (100 mg total) by mouth 2 (two) times daily.  . Coral Calcium 1000mg  Take 1,000 mg by mouth 2 (two) times daily.   . Magnesium Carbonate 400mg  Take 400 mg by mouth 2 (two) times daily.  [name] oxyCODONE-APAP (PERCOCET/) 5-325  Take 1 tablet by mouth every 4 (four) hours  as needed for severe pain.  . pantoprazole  40 MG tablet Take 1 tablet (40 mg total) by mouth daily.  [name] MIRALAX  Take 17 g by mouth 2 (two) times daily.  . potassium chloride SA  20 MEQ  Take 1 tablet (20 mEq total) by mouth 2 (two) times daily.  . pravastatin  40 MG tablet Take 20 mg by mouth daily.   . prochlorperazine  5 MG tablet Take 1 capsule 3 x day before meals only if needed for Nausea  . SENOKOT  Take 1 tablet by mouth 2 (two) times daily.   . silodosin (RAPAFLO) 8 MG CAPS capsule Take 8 mg by mouth daily with breakfast.  . sucralfate  1 G tablet TAKE ONE TABLET BY MOUTH 4 TIMES DAILY BEFORE MEALS AND AT BEDTIME FOR HEARTBURN  . TOBREX)0.3 % ophth oint Place into both eyes 2 (two) times  daily. Place a 1/2 inch ribbon of ointment into the lower eyelid.   Allergies  Allergen Reactions  . Aspartame Diarrhea and Nausea And Vomiting  . Daliresp [Roflumilast] Nausea And Vomiting  . Levofloxacin Nausea And Vomiting  . Shellfish Allergy Other (See Comments)    Flushing  . Tramadol Other (See Comments)    Blurred vision   . Atorvastatin Other (See Comments)    Muscle aches  . Ace Inhibitors Cough    Per Dr Melvyn Novas pulmonology 2013  . Biaxin [Clarithromycin] Other (See Comments)    PATIENT CAN TOLERATE Z-PAK.  Is written on patient's paper chart.  . Erythromycin Other (See Comments)    PATIENT CAN TOLERATE Z-PAK.  It is written on patient's paper chart.  . Hydrocodone Nausea Only   PMHx:   Past Medical History  Diagnosis Date  . CAD (coronary artery disease)     [facility] 2003 with 40% pLAD, 30% mLAD, 100% D1 (moderate vessel), 100%  D2 (small vessel), 95% mid small OM2.  Pt had PTCA to D1;  D2 and OM2 small vessels and tx medically;  Last Myoview (2012): anterior infarct seen with scar, no ischemia. EF normal. Pt managed medically;  Lexiscan Myoview (12/14):  Low risk; ant scar with very mild peri-infarct ischemia; EF 55% with ant AK   . Recurrent aspiration bronchitis/pneumonia ([place])     PFTs 12/09 FVC 97%, FEV1 101%, ratio 77%, TLC 109%. No significant obstruction or restriction.   . Hypertension   . Hyperlipidemia   . Esophageal stricture   . Gastritis   . GERD (gastroesophageal reflux disease)   . Asthma   . Blood transfusion   . Cough   . Wheezing   . Weakness   . Trouble swallowing   . Abdominal pain   . Hiatal hernia   . Shortness of breath   . Anxiety   . TIA (transient ischemic attack)   . BRONCHITIS, ACUTE 07/25/2007         . DIVERTICULOSIS, COLON 08/28/2008    Qualifier: Diagnosis of  By: [name], [name], [name]    . IRON DEFICIENCY ANEMIA, HX OF 07/24/2007    Qualifier: Diagnosis of  By: Tilden Dome    . Chronic nausea   . Chronic back pain   . PONV  (postoperative nausea and vomiting)   . CAD (coronary artery disease)   . Osteoporosis    Immunization History  Administered Date(s) Administered  . DT 05/06/2014  . Influenza Split 02/07/2011  . Influenza Whole 02/09/2009, 01/19/2010, 02/13/2012  . Influenza, High Dose Seasonal PF 03/22/2013, 01/15/2014, 02/27/2015  .  Pneumococcal Conjugate-13 05/06/2014  . Pneumococcal Polysaccharide-23 02/09/2011  . Pneumococcal-Unspecified 02/06/2010, 02/09/2012  . Td 05/10/2003   Past Surgical History  Procedure Laterality Date  . Back surgery    . Bladder repair    . Hemorrhoid surgery    . Cholecystectomy      Patient unsure of date.  [name] Appendectomy      patient unsure of date  . Total abdominal hysterectomy  1975    partial  . Coronary angioplasty  11/16/2001  . Eye surgery  11/2000    bilateral cataracts with lens implant  . Hiatal hernia repair  06/03/2011    Procedure: LAPAROSCOPIC REPAIR OF HIATAL HERNIA;  Surgeon: Adin Hector, MD;  Location: [facility];  Service: General;  Laterality: N/A;  . Laparoscopic nissen fundoplication  [id number]    Procedure: LAPAROSCOPIC NISSEN FUNDOPLICATION;  Surgeon: Adin Hector, MD;  Location: [facility];  Service: General;  Laterality: N/A;  . Lumbar laminectomy/decompression microdiscectomy Right 06/26/2012    Procedure: LUMBAR LAMINECTOMY/DECOMPRESSION MICRODISCECTOMY;  Surgeon: Jessy Oto, MD;  Location: [place];  Service: Orthopedics;  Laterality: Right;  RIGHT L4-5 MICRODISCECTOMY  . Esophagogastroduodenoscopy N/A 03/15/2013    Procedure: ESOPHAGOGASTRODUODENOSCOPY (EGD);  Surgeon: Jerene Bears, MD;  Location: [facility];  Service: Endoscopy;  Laterality: N/A;  . Esophageal manometry N/A 03/25/2013    Procedure: ESOPHAGEAL MANOMETRY (EM);  Surgeon: Inda Castle, MD;  Location: [facility];  Service: Endoscopy;  Laterality: N/A;  . Hiatal hernia repair N/A 04/24/2013    Procedure: LAPAROSCOPIC  REPAIR RECURRENT PARASOPHAGEAL HIATAL HERNIA  WITH FUNDOPLICATION;  Surgeon: Adin Hector, MD;  Location: [facility];  Service: General;  Laterality: N/A;  . Insertion of mesh N/A 04/24/2013    Procedure: INSERTION OF MESH;  Surgeon: Adin Hector, MD;  Location: [facility];  Service: General;  Laterality: N/A;  . Laparoscopic lysis of adhesions N/A 04/24/2013    Procedure: LAPAROSCOPIC LYSIS OF ADHESIONS;  Surgeon: Adin Hector, MD;  Location: [facility];  Service: General;  Laterality: N/A;  . Fixation kyphoplasty lumbar spine  X 2    "L3-4"   FHx:    Reviewed / unchanged  SHx:    Reviewed / unchanged  Systems Review:  Constitutional: Denies fever, chills, wt changes, headaches, insomnia, fatigue, night sweats, change in appetite. Eyes: Denies redness, blurred vision, diplopia, discharge, itchy, watery eyes.  ENT: Denies discharge, congestion, post nasal drip, epistaxis, sore throat, earache, hearing loss, dental pain, tinnitus, vertigo, sinus pain, snoring.  CV: Denies chest pain, palpitations, irregular heartbeat, syncope, dyspnea, diaphoresis, orthopnea, PND, claudication or edema. Respiratory: denies cough, dyspnea, DOE, pleurisy, hoarseness, laryngitis, wheezing.  Gastrointestinal: Denies dysphagia, odynophagia, heartburn, reflux, water brash, abdominal pain or cramps, nausea, vomiting, bloating, diarrhea, constipation, hematemesis, melena, hematochezia  or hemorrhoids. Genitourinary: Denies dysuria, frequency, urgency, nocturia, hesitancy, discharge, hematuria or flank pain. Musculoskeletal: Denies arthralgias, myalgias, stiffness, jt. swelling, pain, limping or strain/sprain.  Skin: Denies pruritus, rash, hives, warts, acne, eczema or change in skin lesion(s). Neuro: No weakness, tremor, incoordination, spasms, paresthesia or pain. Psychiatric: Denies confusion, memory loss or sensory loss. Endo: Denies change in weight, skin or hair change.  Heme/Lymph: No excessive bleeding, bruising or enlarged lymph nodes.  Physical  Exam  BP 102/56 mmHg  Pulse 92  Temp(Src) 97 F (36.1 C)  Resp 16  Ht 5' 3.5" (1.613 m)  Wt 140 lb 3.2 oz (63.594 kg)  BMI 24.44 kg/m2  Appears chronically ill  nourished and in no distress. Eyes:  PERRLA, EOMs, conjunctiva no swelling or erythema. Sinuses: No frontal/maxillary tenderness ENT/Mouth: EAC's clear, TM's nl w/o erythema, bulging. Nares clear w/o erythema, swelling, exudates. Oropharynx clear without erythema or exudates. Oral hygiene is good. Tongue normal, non obstructing. Hearing intact.  Neck: Supple. Thyroid nl. Car 2+/2+ without bruits, nodes or JVD. Chest: Respirations nl with BS clear & equal w/o rales, rhonchi, wheezing or stridor.  Cor: Heart sounds normal w/ regular rate and rhythm without sig. murmurs, gallops, clicks, or rubs. Peripheral pulses normal and equal  without edema.  Abdomen: Soft & bowel sounds normal. Non-tender w/o guarding, rebound, hernias, masses, or organomegaly.  Lymphatics: Unremarkable.  Musculoskeletal: Full ROM all peripheral extremities, joint stability, 5/5 strength, and normal gait.  Skin: Warm, dry without exposed rashes, lesions or ecchymosis apparent.  Neuro: Cranial nerves intact, reflexes equal bilaterally. Sensory-motor testing grossly intact. Tendon reflexes grossly intact.  Pysch: Alert & oriented x 3. Flat affect.  Insight and judgement nl & appropriate. No ideations.  Assessment and Plan:  1. Essential hypertension   2. Mixed hyperlipidemia   3. Prediabetes   4. Vitamin D deficiency   5. ASCAD, s/p PTCA (2003)   6. Asthma, mild intermittent, uncomplicated   7. DDD (degenerative disc disease), lumbar   8. Chronic pain syndrome   9. Gastroesophageal reflux disease, esophagitis presence not specified   Recommended regular exercise, BP monitoring, weight control, and discussed med and SE's. Recommended labs to assess and monitor clinical status. Further disposition pending results of labs. Over 30 minutes of  exam, counseling, chart review was performed

## 2015-04-29 ENCOUNTER — Other Ambulatory Visit: Payer: Self-pay | Admitting: Internal Medicine

## 2015-04-29 DIAGNOSIS — F411 Generalized anxiety disorder: Secondary | ICD-10-CM

## 2015-04-29 DIAGNOSIS — IMO0001 Reserved for inherently not codable concepts without codable children: Secondary | ICD-10-CM

## 2015-04-29 MED ORDER — ALPRAZOLAM 0.5 MG PO TABS: ORAL_TABLET | ORAL | Status: DC

## 2015-04-30 DIAGNOSIS — G894 Chronic pain syndrome: Secondary | ICD-10-CM | POA: Diagnosis not present

## 2015-04-30 DIAGNOSIS — M545 Low back pain: Secondary | ICD-10-CM | POA: Diagnosis not present

## 2015-04-30 DIAGNOSIS — M5136 Other intervertebral disc degeneration, lumbar region: Secondary | ICD-10-CM | POA: Diagnosis not present

## 2015-04-30 DIAGNOSIS — M4307 Spondylolysis, lumbosacral region: Secondary | ICD-10-CM | POA: Diagnosis not present

## 2015-05-04 ENCOUNTER — Encounter: Payer: Self-pay | Admitting: *Deleted

## 2015-05-20 ENCOUNTER — Encounter: Payer: Self-pay | Admitting: Internal Medicine

## 2015-05-20 ENCOUNTER — Other Ambulatory Visit (HOSPITAL_COMMUNITY): Payer: Self-pay | Admitting: Specialist

## 2015-05-22 ENCOUNTER — Encounter (HOSPITAL_COMMUNITY)
Admission: RE | Admit: 2015-05-22 | Discharge: 2015-05-22 | Disposition: A | Discharge status: Home / Self Care | Payer: PPO | Source: Ambulatory Visit | Attending: Specialist | Admitting: Specialist

## 2015-05-22 ENCOUNTER — Encounter (HOSPITAL_COMMUNITY): Payer: Self-pay

## 2015-05-22 DIAGNOSIS — Z01818 Encounter for other preprocedural examination: Secondary | ICD-10-CM | POA: Diagnosis not present

## 2015-05-22 DIAGNOSIS — I10 Essential (primary) hypertension: Secondary | ICD-10-CM | POA: Diagnosis not present

## 2015-05-22 DIAGNOSIS — E785 Hyperlipidemia, unspecified: Secondary | ICD-10-CM | POA: Insufficient documentation

## 2015-05-22 DIAGNOSIS — K219 Gastro-esophageal reflux disease without esophagitis: Secondary | ICD-10-CM | POA: Diagnosis not present

## 2015-05-22 DIAGNOSIS — Z79899 Other long term (current) drug therapy: Secondary | ICD-10-CM | POA: Diagnosis not present

## 2015-05-22 DIAGNOSIS — Z7982 Long term (current) use of aspirin: Secondary | ICD-10-CM | POA: Insufficient documentation

## 2015-05-22 DIAGNOSIS — Z9861 Coronary angioplasty status: Secondary | ICD-10-CM | POA: Diagnosis not present

## 2015-05-22 DIAGNOSIS — J45909 Unspecified asthma, uncomplicated: Secondary | ICD-10-CM | POA: Diagnosis not present

## 2015-05-22 DIAGNOSIS — Z8673 Personal history of transient ischemic attack (TIA), and cerebral infarction without residual deficits: Secondary | ICD-10-CM | POA: Insufficient documentation

## 2015-05-22 DIAGNOSIS — Z01812 Encounter for preprocedural laboratory examination: Secondary | ICD-10-CM | POA: Insufficient documentation

## 2015-05-22 DIAGNOSIS — I2510 Atherosclerotic heart disease of native coronary artery without angina pectoris: Secondary | ICD-10-CM | POA: Diagnosis not present

## 2015-05-22 DIAGNOSIS — M5126 Other intervertebral disc displacement, lumbar region: Secondary | ICD-10-CM | POA: Insufficient documentation

## 2015-05-22 HISTORY — DX: Other difficulties with micturition: R39.198

## 2015-05-22 HISTORY — DX: Personal history of other specified conditions: Z87.898

## 2015-05-22 HISTORY — DX: Personal history of colon polyps, unspecified: Z86.0100

## 2015-05-22 HISTORY — DX: Constipation, unspecified: K59.00

## 2015-05-22 HISTORY — DX: Personal history of other diseases of the respiratory system: Z87.09

## 2015-05-22 HISTORY — DX: Personal history of colonic polyps: Z86.010

## 2015-05-22 LAB — BASIC METABOLIC PANEL
ANION GAP: 8 (ref 5–15)
BUN: 16 mg/dL (ref 6–20)
CALCIUM: 9.2 mg/dL (ref 8.9–10.3)
CO2: 27 mmol/L (ref 22–32)
CREATININE: 0.8 mg/dL (ref 0.44–1.00)
Chloride: 105 mmol/L (ref 101–111)
GFR calc Af Amer: >60 mL/min (ref >60)
GLUCOSE: 93 mg/dL (ref 65–99)
Potassium: 4.1 mmol/L (ref 3.5–5.1)
Sodium: 140 mmol/L (ref 135–145)

## 2015-05-22 LAB — CBC
HCT: 43.3 % (ref 36.0–46.0)
HEMOGLOBIN: 14.2 g/dL (ref 12.0–15.0)
MCH: 32.4 pg (ref 26.0–34.0)
MCHC: 32.8 g/dL (ref 30.0–36.0)
MCV: 98.9 fL (ref 78.0–100.0)
PLATELETS: 283 10*3/uL (ref 150–400)
RBC: 4.38 MIL/uL (ref 3.87–5.11)
RDW: 13 % (ref 11.5–15.5)
WBC: 8.3 10*3/uL (ref 4.0–10.5)

## 2015-05-22 LAB — SURGICAL PCR SCREEN
MRSA, PCR: NEGATIVE
STAPHYLOCOCCUS AUREUS: NEGATIVE

## 2015-05-22 MED ORDER — CHLORHEXIDINE GLUCONATE 4 % EX LIQD: 60.0000 mL | Freq: Once | CUTANEOUS | Status: DC

## 2015-05-22 NOTE — Pre-Procedure Instructions (Signed)
[name] [name]  05/22/2015      [facility] [place], [place] - [address] [place] [address] Phone: [phone] Fax: [phone]  [id number] [place], [place] [id number] [place] [id number] Phone: [phone] Fax: [phone]    Your procedure is scheduled on Fri, Jan 20 @ 12:30 PM  Report to [facility] Admitting at 10:30 AM  Call this number if you have problems the morning of surgery:  [phone]   Remember:  Do not eat food or drink liquids after midnight.  Take these medicines the morning of surgery with A SIP OF WATER Albuterol<Bring Your Inhaler With You>,Xanax(Alprazolam),Neb treatment,Metoprolol(Lopressor),Dulera(Mometasone),Zofran(Ondansetron-if needed),Pain Pill(if needed),Protonix(Pantoprazole),and Eye Drops              No Goody's,BC's,Aleve,Advil,Motrin,Ibuprofen,Fish Oil,or any Herbal Medications.             How to Manage Your Diabetes Before Surgery   Why is it important to control my blood sugar before and after surgery?   Improving blood sugar levels before and after surgery helps healing and can limit problems.  A way of improving blood sugar control is eating a healthy diet by:  - Eating less sugar and carbohydrates  - Increasing activity/exercise  - Talk with your doctor about reaching your blood sugar goals  High blood sugars (greater than 180 mg/dL) can raise your risk of infections and slow down your recovery so you will need to focus on controlling your diabetes during the weeks before surgery.  Make sure that the doctor who takes care of your diabetes knows about your planned surgery including the date and location.  How do I manage my blood sugars before surgery?   Check your blood sugar at least 4 times a day, 2 days before surgery to make sure that they are not too high or low.   Check  your blood sugar the morning of your surgery when you wake up and every 2               hours until you get to the Short-Stay unit.  If your blood sugar is less than 70 mg/dL, you will need to treat for low blood sugar by:  Treat a low blood sugar (less than 70 mg/dL) with 1/2 cup of clear juice (cranberry or apple), 4 glucose tablets, OR glucose gel.  Recheck blood sugar in 15 minutes after treatment (to make sure it is greater than 70 mg/dL).  If blood sugar is not greater than 70 mg/dL on re-check, call [phone] for further instructions.   Report your blood sugar to the Short-Stay nurse when you get to Short-Stay.  References:  [facility], 2007 "How to Manage your Diabetes Before and After Surgery".  What do I do about my diabetes medications?   Do not take oral diabetes medicines (pills) the morning of surgery.          Do not take other diabetes injectables the day of surgery including Byetta, Victoza, Bydureon, and Trulicity.    If your CBG is greater than 220 mg/dL, you may take 1/2 of your sliding scale (correction) dose of insulin.   For patients with "Insulin Pumps":  Contact your diabetes doctor for specific instructions before surgery.   Decrease basal insulin rates by 20% at midnight the night before surgery.  Note that if your surgery is planned  to be longer than 2 hours, your insulin pump will be removed and intravenous (IV) insulin will be started and managed by the nurses and anesthesiologist.  You will be able to restart your insulin pump once you are awake and able to manage it.  Make sure to bring insulin pump supplies to the hospital with you in case your site needs to be changed.         Do not wear jewelry, make-up or nail polish.  Do not wear lotions, powders, or perfumes.  You may wear deodorant.  Do not shave 48 hours prior to surgery.  Men may shave face and neck.  Do not bring valuables to the  hospital.  [facility] is not responsible for any belongings or valuables.  Contacts, dentures or bridgework may not be worn into surgery.  Leave your suitcase in the car.  After surgery it may be brought to your room.  For patients admitted to the hospital, discharge time will be determined by your treatment team.  Patients discharged the day of surgery will not be allowed to drive home.    Special instructions:  [facility] - Preparing for Surgery  Before surgery, you can play an important role.  Because skin is not sterile, your skin needs to be as free of germs as possible.  You can reduce the number of germs on you skin by washing with CHG (chlorahexidine gluconate) soap before surgery.  CHG is an antiseptic cleaner which kills germs and bonds with the skin to continue killing germs even after washing.  Please DO NOT use if you have an allergy to CHG or antibacterial soaps.  If your skin becomes reddened/irritated stop using the CHG and inform your nurse when you arrive at Short Stay.  Do not shave (including legs and underarms) for at least 48 hours prior to the first CHG shower.  You may shave your face.  Please follow these instructions carefully:   1.  Shower with CHG Soap the night before surgery and the                                morning of Surgery.  2.  If you choose to wash your hair, wash your hair first as usual with your       normal shampoo.  3.  After you shampoo, rinse your hair and body thoroughly to remove the                      Shampoo.  4.  Use CHG as you would any other liquid soap.  You can apply chg directly       to the skin and wash gently with scrungie or a clean washcloth.  5.  Apply the CHG Soap to your body ONLY FROM THE NECK DOWN.        Do not use on open wounds or open sores.  Avoid contact with your eyes,       ears, mouth and genitals (private parts).  Wash genitals (private parts)       with your normal soap.  6.  Wash thoroughly, paying special  attention to the area where your surgery        will be performed.  7.  Thoroughly rinse your body with warm water from the neck down.  8.  DO NOT shower/wash with your normal soap after using and rinsing off  the CHG Soap.  9.  Pat yourself dry with a clean towel.            10.  Wear clean pajamas.            11.  Place clean sheets on your bed the night of your first shower and do not        sleep with pets.  Day of Surgery  Do not apply any lotions/deoderants the morning of surgery.  Please wear clean clothes to the hospital/surgery center.    Please read over the following fact sheets that you were given. Pain Booklet, Coughing and Deep Breathing, MRSA Information and Surgical Site Infection Prevention

## 2015-05-22 NOTE — Progress Notes (Signed)
Anesthesia Chart Review:  Pt is 80 year old female scheduled for R L2-3 microdiscectomy on 05/29/2015 with Dr. Louanne Skye.   Cardiologist is Dr. Loralie Champagne, last office visit 12/11/14. PCP is Dr. [name].   PMH includes:  CAD ([place] 2003 with 40% pLAD, 30% mLAD, 100% D1 (moderate vessel), 100% D2 (small vessel), 95% mid small OM2. Pt had PTCA to D1; D2 and OM2 small vessels and tx medically),  HTN, hyperlipidemia, TIA, asthma, GERD, post-op N/V. Never smoker. BMI 25. S/p laparoscopic hiatal hernia repair 04/24/13 and 06/03/11. S/p lumbar laminectomy/microdiscectomy 06/26/12.   Medications include: albuterol, amlodipine, ASA, duoneb, losartan, metoprolol, dulera, protonix, pravastatin  Chest x-ray 08/30/14 reviewed. No active cardiopulmonary disease.   EKG 09/19/14: NSR. LAD. Septal infarct, age undetermined. Possible Lateral infarct, age undetermined.   Nuclear stress test 09/20/14:   Defect 1: There is a medium defect present in the mid anterior and apical anterior location.  Findings consistent with prior myocardial infarction.  The left ventricular ejection fraction is normal (55-65%).  This is a low risk study.  Echo 09/05/14:  - Left ventricle: The cavity size was normal. Systolic function was normal. The estimated ejection fraction was in the range of 55% to 60%. Wall motion was normal; there were no regional wall motion abnormalities. Doppler parameters are consistent with elevated ventricular end-diastolic filling pressure. - Left atrium: The atrium was mildly dilated. - Right atrium: The atrium was mildly dilated. - Atrial septum: There was increased thickness of the septum, consistent with lipomatous hypertrophy. No defect or patent foramen ovale was identified. - Pulmonary arteries: PA peak pressure: 35 mm Hg (S).  Cardiac cath 11/16/01:  1. Left ventricular systolic function characterized by moderate akinesis of the anterolateral wall but overall preserved ejection fraction. 2.  Small vessel coronary artery disease involving diagonal branches and a small obtuse marginal branch. In review of these findings, the most significant of the diseased small branch, it appears to be the first diagonal which does supply a large portion of the anterolateral wall. S/p successful percutaneous transluminal coronary angioplasty of the first diagonal branch reducing a 100% chronic total occlusion with TIMI-2 flow to 25% residual with TIMI-3 flow.  Pt has medical clearance for surgery from Dr. Melford Aase.   If no changes, I anticipate pt can proceed with surgery as scheduled.   Willeen Cass, FNP-BC [facility]/Anesthesiology Phone: [phone] 05/22/2015 1:37 PM

## 2015-05-22 NOTE — Progress Notes (Signed)
Cardiologist is Dr.McLean=last visit in epic from 12-11-14  Echo reports in epic from 2012/2014/2016  Stress test reports in epic from 2014/2016  Heart cath in 2003  EKG in epic from 09-19-14  CXR in epic from 08-30-14  Medical Md is Dr.William Melford Aase

## 2015-05-28 MED ORDER — CEFAZOLIN SODIUM-DEXTROSE 2-3 GM-% IV SOLR
2.0000 g | INTRAVENOUS | Status: AC
  Administered 2015-05-29: 2 g via INTRAVENOUS
  Filled 2015-05-28: qty 50

## 2015-05-28 NOTE — H&P (Signed)
[name] [name] is an 80 y.o. female.   Chief Complaint: chronic back pain HPI: patient has been seen in the office for ongoing complaint.  Mri showed a right L2-3 HNP.  Symptoms progressively worsening.  Failed conservative treatment.   Past Medical History  Diagnosis Date  . CAD (coronary artery disease)     LHC 2003 with 40% pLAD, 30% mLAD, 100% D1 (moderate vessel), 100%  D2 (small vessel), 95% mid small OM2.  Pt had PTCA to D1;  D2 and OM2 small vessels and tx medically;  Last Myoview (2012): anterior infarct seen with scar, no ischemia. EF normal. Pt managed medically;  Lexiscan Myoview (12/14):  Low risk; ant scar with very mild peri-infarct ischemia; EF 55% with ant AK   . Hyperlipidemia     takes Pravastatin daily  . TIA (transient ischemic attack)     no residual effects noted  . DIVERTICULOSIS, COLON 08/28/2008    Qualifier: Diagnosis of  By: [name], [name], [name]    . Chronic nausea     takes Zofran daily as needed  . Chronic back pain   . PONV (postoperative nausea and vomiting)   . CAD (coronary artery disease)   . Osteoporosis   . Anxiety     takes Xanax daily as needed  . Asthma     Albuterol daily as needed  . Constipation     takes Senokot and Miralax daily  . Hypertension     takes Amlodipine,Metoprolol,and Losartan daily  . History of bronchitis several of yrs ago  . History of vertigo     no meds  . Chronic back pain     HNP  . Osteopenia   . Hiatal hernia     hx of  . GERD (gastroesophageal reflux disease)     hx of  . History of colon polyps     benign  . Slow urinary stream     takes Rapaflo daily    Past Surgical History  Procedure Laterality Date  . Back surgery    . Bladder repair    . Hemorrhoid surgery    . Cholecystectomy      Patient unsure of date.  [name] Appendectomy      patient unsure of date  . Total abdominal hysterectomy  1975    partial  . Eye surgery  11/2000    bilateral cataracts with lens implant  . Hiatal hernia repair   06/03/2011    Procedure: LAPAROSCOPIC REPAIR OF HIATAL HERNIA;  Surgeon: Adin Hector, MD;  Location: [facility];  Service: General;  Laterality: N/A;  . Laparoscopic nissen fundoplication  [id number]    Procedure: LAPAROSCOPIC NISSEN FUNDOPLICATION;  Surgeon: Adin Hector, MD;  Location: [facility];  Service: General;  Laterality: N/A;  . Lumbar laminectomy/decompression microdiscectomy Right 06/26/2012    Procedure: LUMBAR LAMINECTOMY/DECOMPRESSION MICRODISCECTOMY;  Surgeon: Jessy Oto, MD;  Location: [place];  Service: Orthopedics;  Laterality: Right;  RIGHT L4-5 MICRODISCECTOMY  . Esophagogastroduodenoscopy N/A 03/15/2013    Procedure: ESOPHAGOGASTRODUODENOSCOPY (EGD);  Surgeon: Jerene Bears, MD;  Location: [facility];  Service: Endoscopy;  Laterality: N/A;  . Esophageal manometry N/A 03/25/2013    Procedure: ESOPHAGEAL MANOMETRY (EM);  Surgeon: Inda Castle, MD;  Location: [facility];  Service: Endoscopy;  Laterality: N/A;  . Hiatal hernia repair N/A 04/24/2013    Procedure: LAPAROSCOPIC  REPAIR RECURRENT PARASOPHAGEAL HIATAL HERNIA WITH FUNDOPLICATION;  Surgeon: Adin Hector, MD;  Location: [facility];  Service: General;  Laterality:  N/A;  . Insertion of mesh N/A 04/24/2013    Procedure: INSERTION OF MESH;  Surgeon: Adin Hector, MD;  Location: [facility];  Service: General;  Laterality: N/A;  . Laparoscopic lysis of adhesions N/A 04/24/2013    Procedure: LAPAROSCOPIC LYSIS OF ADHESIONS;  Surgeon: Adin Hector, MD;  Location: [facility];  Service: General;  Laterality: N/A;  . Fixation kyphoplasty lumbar spine  X 2    "L3-4"  . Coronary angioplasty  11/16/2001    1 stent    Family History  Problem Relation Age of Onset  . Cancer Mother     colon   . Colon cancer Mother 68  . Heart disease Father 38    heart attack   Social History:  reports that she has never smoked. She has never used smokeless tobacco. She reports that she does not drink alcohol or use illicit  drugs.  Allergies:  Allergies  Allergen Reactions  . Aspartame Diarrhea and Nausea And Vomiting  . Daliresp [Roflumilast] Nausea And Vomiting  . Levofloxacin Nausea And Vomiting  . Shellfish Allergy Other (See Comments)    Flushing  . Tramadol Other (See Comments)    Blurred vision   . Atorvastatin Other (See Comments)    Muscle aches  . Ace Inhibitors Cough    Per Dr Melvyn Novas pulmonology 2013  . Biaxin [Clarithromycin] Other (See Comments)    PATIENT CAN TOLERATE Z-PAK.  Is written on patient's paper chart.  . Erythromycin Other (See Comments)    PATIENT CAN TOLERATE Z-PAK.  It is written on patient's paper chart.  . Hydrocodone Nausea Only    No prescriptions prior to admission    No results found for this or any previous visit (from the past 48 hour(s)). No results found.  Review of Systems  Constitutional: Negative.   Respiratory: Negative.   Cardiovascular: Negative.   Gastrointestinal: Negative.   Genitourinary: Negative.   Musculoskeletal: Positive for back pain.  Neurological: Negative.   Psychiatric/Behavioral: Negative.     There were no vitals taken for this visit. Physical Exam  Constitutional: She is oriented to person, place, and time. No distress.  HENT:  Head: Atraumatic.  Eyes: EOM are normal.  Cardiovascular: Normal rate.   Respiratory: No respiratory distress.  GI: She exhibits no distension.  Musculoskeletal: She exhibits tenderness.  Neurological: She is alert and oriented to person, place, and time.  Skin: Skin is warm and dry.  Psychiatric: She has a normal mood and affect.      EXAM: MRI LUMBAR SPINE WITHOUT AND WITH CONTRAST  TECHNIQUE: Multiplanar and multiecho pulse sequences of the lumbar spine were obtained without and with intravenous contrast.  CONTRAST: 57mL MULTIHANCE GADOBENATE DIMEGLUMINE 529 MG/ML IV SOLN  COMPARISON: MRI lumbar spine without contrast 05/18/2014.  FINDINGS: Normal signal is present in the conus  medullaris which terminates at T12-L1. A remote superior endplate fracture of L1 is stable. A remote superior endplate fracture at 624THL is stable. Vertebral augmentation is noted at L2, L3, and of L4 without evidence for residual or recurrent edema. Vertebral body height is preserved at L5.  No pathologic enhancement is present.  T12-L1: Retropulsed bone partially effaces the ventral CSF. The central canal and foramina are patent.  L1-2: A mild broad-based disc protrusion is present. Mild facet hypertrophy is noted bilaterally. The central canal is patent. Mild foraminal narrowing is noted bilaterally.  L2-3: A rightward disc protrusion is present. Moderate facet hypertrophy is evident. Severe right subarticular and moderate to  severe right foraminal stenosis is present. Moderate left foraminal stenosis is present as well.  L3-4: Mild disc bulging is present. Moderate facet hypertrophy is noted bilaterally. This results in mild to moderate foraminal stenosis bilaterally, right greater than left.  L4-5: Grade 1 anterolisthesis is present. A right laminectomy is noted. Advanced facet hypertrophy is worse on the left. Broad-based disc protrusion is present. The central canal is patent. Mild foraminal narrowing is stable.  L5-S1: A laminectomy is noted. Central canal is decompressed. Facet spurring is present without significant stenosis.  IMPRESSION: 1. Spinal augmentation at L2, L3, and L4 without complicating features. 2. Remote superior endplate fractures at 624THL and L1. 3. Multilevel spondylosis of the lumbar spine is most severe at L2-3. 4. Severe right subarticular and moderate to severe right foraminal stenosis at L2-3. 5. Moderate left foraminal stenosis at L2-3. 6. Mild foraminal narrowing but laterally at L1-2. 7. Mild to moderate foraminal narrowing bilaterally at L3-4. 8. Stable mild foraminal narrowing bilaterally at L4-5. 9. Laminectomy with moderate  facet spurring but no significant stenosis at L5-S1.  Assessment/Plan Right L2-3 HNP.  Will proceed with right L2-3 microdiscectomy as scheduled.  Surgical procedure along with possible risks and complications discussed.  All questions answered.  OWENS,Kenyatte Chatmon M 05/28/2015, 11:43 AM   Patient examined and lab reviewed with Ricard Dillon, PA-C.

## 2015-05-29 ENCOUNTER — Encounter (HOSPITAL_COMMUNITY): Payer: Self-pay | Admitting: Specialist

## 2015-05-29 ENCOUNTER — Ambulatory Visit (HOSPITAL_COMMUNITY): Payer: PPO | Admitting: Anesthesiology

## 2015-05-29 ENCOUNTER — Ambulatory Visit (HOSPITAL_COMMUNITY): Payer: PPO

## 2015-05-29 ENCOUNTER — Observation Stay (HOSPITAL_COMMUNITY)
Admission: RE | Admit: 2015-05-29 | Discharge: 2015-05-30 | Disposition: A | Discharge status: Home / Self Care | Payer: PPO | Source: Ambulatory Visit | Attending: Specialist | Admitting: Specialist

## 2015-05-29 ENCOUNTER — Ambulatory Visit (HOSPITAL_COMMUNITY): Payer: PPO | Admitting: Emergency Medicine

## 2015-05-29 ENCOUNTER — Encounter (HOSPITAL_COMMUNITY)
Admission: RE | Disposition: A | Discharge status: Home / Self Care | Payer: PPO | Source: Ambulatory Visit | Attending: Specialist

## 2015-05-29 DIAGNOSIS — I10 Essential (primary) hypertension: Secondary | ICD-10-CM | POA: Diagnosis not present

## 2015-05-29 DIAGNOSIS — F419 Anxiety disorder, unspecified: Secondary | ICD-10-CM | POA: Diagnosis not present

## 2015-05-29 DIAGNOSIS — K5900 Constipation, unspecified: Secondary | ICD-10-CM | POA: Diagnosis not present

## 2015-05-29 DIAGNOSIS — Z9049 Acquired absence of other specified parts of digestive tract: Secondary | ICD-10-CM | POA: Diagnosis not present

## 2015-05-29 DIAGNOSIS — M5126 Other intervertebral disc displacement, lumbar region: Secondary | ICD-10-CM | POA: Diagnosis not present

## 2015-05-29 DIAGNOSIS — E785 Hyperlipidemia, unspecified: Secondary | ICD-10-CM | POA: Diagnosis not present

## 2015-05-29 DIAGNOSIS — K219 Gastro-esophageal reflux disease without esophagitis: Secondary | ICD-10-CM | POA: Diagnosis not present

## 2015-05-29 DIAGNOSIS — M5441 Lumbago with sciatica, right side: Secondary | ICD-10-CM | POA: Diagnosis not present

## 2015-05-29 DIAGNOSIS — K449 Diaphragmatic hernia without obstruction or gangrene: Secondary | ICD-10-CM | POA: Insufficient documentation

## 2015-05-29 DIAGNOSIS — M48062 Spinal stenosis, lumbar region with neurogenic claudication: Secondary | ICD-10-CM | POA: Diagnosis present

## 2015-05-29 DIAGNOSIS — M4606 Spinal enthesopathy, lumbar region: Secondary | ICD-10-CM | POA: Diagnosis not present

## 2015-05-29 DIAGNOSIS — M5137 Other intervertebral disc degeneration, lumbosacral region: Secondary | ICD-10-CM | POA: Diagnosis not present

## 2015-05-29 DIAGNOSIS — M5431 Sciatica, right side: Secondary | ICD-10-CM | POA: Diagnosis not present

## 2015-05-29 DIAGNOSIS — Z90710 Acquired absence of both cervix and uterus: Secondary | ICD-10-CM | POA: Diagnosis not present

## 2015-05-29 DIAGNOSIS — M4806 Spinal stenosis, lumbar region: Secondary | ICD-10-CM | POA: Diagnosis not present

## 2015-05-29 DIAGNOSIS — Z9861 Coronary angioplasty status: Secondary | ICD-10-CM | POA: Diagnosis not present

## 2015-05-29 DIAGNOSIS — M549 Dorsalgia, unspecified: Secondary | ICD-10-CM | POA: Diagnosis not present

## 2015-05-29 DIAGNOSIS — I2510 Atherosclerotic heart disease of native coronary artery without angina pectoris: Secondary | ICD-10-CM | POA: Diagnosis not present

## 2015-05-29 DIAGNOSIS — Z9889 Other specified postprocedural states: Secondary | ICD-10-CM | POA: Diagnosis not present

## 2015-05-29 DIAGNOSIS — Z8673 Personal history of transient ischemic attack (TIA), and cerebral infarction without residual deficits: Secondary | ICD-10-CM | POA: Diagnosis not present

## 2015-05-29 DIAGNOSIS — J45909 Unspecified asthma, uncomplicated: Secondary | ICD-10-CM | POA: Diagnosis not present

## 2015-05-29 DIAGNOSIS — Z79899 Other long term (current) drug therapy: Secondary | ICD-10-CM | POA: Diagnosis not present

## 2015-05-29 DIAGNOSIS — Z419 Encounter for procedure for purposes other than remedying health state, unspecified: Secondary | ICD-10-CM

## 2015-05-29 DIAGNOSIS — M810 Age-related osteoporosis without current pathological fracture: Secondary | ICD-10-CM | POA: Diagnosis not present

## 2015-05-29 HISTORY — PX: LUMBAR LAMINECTOMY/DECOMPRESSION MICRODISCECTOMY: SHX5026

## 2015-05-29 LAB — GLUCOSE, CAPILLARY: Glucose-Capillary: 150 mg/dL — ABNORMAL HIGH (ref 65–99)

## 2015-05-29 SURGERY — LUMBAR LAMINECTOMY/DECOMPRESSION MICRODISCECTOMY
Anesthesia: General

## 2015-05-29 MED ORDER — SODIUM CHLORIDE 0.9 % IV SOLN
10000.0000 ug | INTRAVENOUS | Status: DC | PRN
  Administered 2015-05-29 (×3): 40 ug via INTRAVENOUS

## 2015-05-29 MED ORDER — OXYCODONE HCL 5 MG PO TABS
5.0000 mg | ORAL_TABLET | Freq: Four times a day (QID) | ORAL | Status: DC | PRN
  Administered 2015-05-29: 5 mg via ORAL

## 2015-05-29 MED ORDER — ALPRAZOLAM 0.25 MG PO TABS
0.2500 mg | ORAL_TABLET | Freq: Two times a day (BID) | ORAL | Status: DC | PRN
  Administered 2015-05-30: 0.25 mg via ORAL
  Filled 2015-05-29: qty 1

## 2015-05-29 MED ORDER — ONDANSETRON HCL 4 MG/2ML IJ SOLN
INTRAMUSCULAR | Status: AC
  Filled 2015-05-29: qty 2

## 2015-05-29 MED ORDER — MENTHOL 3 MG MT LOZG: 1.0000 | LOZENGE | OROMUCOSAL | Status: DC | PRN

## 2015-05-29 MED ORDER — TAMSULOSIN HCL 0.4 MG PO CAPS
0.4000 mg | ORAL_CAPSULE | Freq: Every day | ORAL | Status: DC
  Administered 2015-05-30: 0.4 mg via ORAL
  Filled 2015-05-29: qty 1

## 2015-05-29 MED ORDER — PROCHLORPERAZINE EDISYLATE 5 MG/ML IJ SOLN
5.0000 mg | Freq: Four times a day (QID) | INTRAMUSCULAR | Status: DC | PRN
  Administered 2015-05-29 – 2015-05-30 (×2): 5 mg via INTRAVENOUS
  Filled 2015-05-29 (×3): qty 1

## 2015-05-29 MED ORDER — HYDRALAZINE HCL 20 MG/ML IJ SOLN
INTRAMUSCULAR | Status: DC | PRN
  Administered 2015-05-29: 2 mg via INTRAVENOUS

## 2015-05-29 MED ORDER — SODIUM CHLORIDE 0.9 % IJ SOLN: 3.0000 mL | INTRAMUSCULAR | Status: DC | PRN

## 2015-05-29 MED ORDER — ORPHENADRINE CITRATE ER 100 MG PO TB12: 100.0000 mg | ORAL_TABLET | Freq: Two times a day (BID) | ORAL | Status: DC | PRN

## 2015-05-29 MED ORDER — ACETAMINOPHEN 650 MG RE SUPP
650.0000 mg | RECTAL | Status: DC | PRN
Start: 2015-05-29 — End: 2015-05-30

## 2015-05-29 MED ORDER — ALENDRONATE SODIUM 70 MG PO TABS: 70.0000 mg | ORAL_TABLET | ORAL | Status: DC

## 2015-05-29 MED ORDER — GLYCOPYRROLATE 0.2 MG/ML IJ SOLN
INTRAMUSCULAR | Status: DC | PRN
  Administered 2015-05-29: 0.4 mg via INTRAVENOUS

## 2015-05-29 MED ORDER — PROCHLORPERAZINE MALEATE 5 MG PO TABS
5.0000 mg | ORAL_TABLET | Freq: Four times a day (QID) | ORAL | Status: DC | PRN
  Filled 2015-05-29 (×2): qty 1

## 2015-05-29 MED ORDER — ONDANSETRON HCL 4 MG/2ML IJ SOLN
INTRAMUSCULAR | Status: DC | PRN
  Administered 2015-05-29 (×2): 4 mg via INTRAVENOUS

## 2015-05-29 MED ORDER — ROCURONIUM BROMIDE 100 MG/10ML IV SOLN
INTRAVENOUS | Status: DC | PRN
  Administered 2015-05-29: 20 mg via INTRAVENOUS

## 2015-05-29 MED ORDER — MINERAL OIL LIGHT 100 % EX OIL
TOPICAL_OIL | CUTANEOUS | Status: AC
  Filled 2015-05-29: qty 25

## 2015-05-29 MED ORDER — AMLODIPINE BESYLATE 10 MG PO TABS
10.0000 mg | ORAL_TABLET | Freq: Every day | ORAL | Status: DC
  Administered 2015-05-30: 10 mg via ORAL
  Filled 2015-05-29: qty 1

## 2015-05-29 MED ORDER — 0.9 % SODIUM CHLORIDE (POUR BTL) OPTIME
TOPICAL | Status: DC | PRN
  Administered 2015-05-29: 1000 mL

## 2015-05-29 MED ORDER — HYDROMORPHONE HCL 1 MG/ML IJ SOLN
0.5000 mg | INTRAMUSCULAR | Status: DC | PRN
  Administered 2015-05-29 (×2): 0.5 mg via INTRAVENOUS

## 2015-05-29 MED ORDER — EPHEDRINE SULFATE 50 MG/ML IJ SOLN
INTRAMUSCULAR | Status: DC | PRN
  Administered 2015-05-29: 10 mg via INTRAVENOUS

## 2015-05-29 MED ORDER — ONDANSETRON HCL 4 MG/2ML IJ SOLN
4.0000 mg | INTRAMUSCULAR | Status: DC | PRN
  Administered 2015-05-29: 4 mg via INTRAVENOUS

## 2015-05-29 MED ORDER — FENTANYL CITRATE (PF) 100 MCG/2ML IJ SOLN
INTRAMUSCULAR | Status: DC | PRN
  Administered 2015-05-29: 100 ug via INTRAVENOUS
  Administered 2015-05-29: 50 ug via INTRAVENOUS

## 2015-05-29 MED ORDER — LIDOCAINE HCL (CARDIAC) 20 MG/ML IV SOLN
INTRAVENOUS | Status: DC | PRN
  Administered 2015-05-29: 100 mg via INTRAVENOUS

## 2015-05-29 MED ORDER — ASPIRIN EC 81 MG PO TBEC
81.0000 mg | DELAYED_RELEASE_TABLET | Freq: Every day | ORAL | Status: DC
  Administered 2015-05-30: 81 mg via ORAL
  Filled 2015-05-29: qty 1

## 2015-05-29 MED ORDER — IPRATROPIUM-ALBUTEROL 0.5-2.5 (3) MG/3ML IN SOLN: 3.0000 mL | Freq: Four times a day (QID) | RESPIRATORY_TRACT | Status: DC | PRN

## 2015-05-29 MED ORDER — TOBRAMYCIN 0.3 % OP OINT
TOPICAL_OINTMENT | Freq: Two times a day (BID) | OPHTHALMIC | Status: DC
  Administered 2015-05-30 (×2): via OPHTHALMIC
  Filled 2015-05-29 (×2): qty 3.5

## 2015-05-29 MED ORDER — LOSARTAN POTASSIUM 50 MG PO TABS
100.0000 mg | ORAL_TABLET | Freq: Every day | ORAL | Status: DC
  Administered 2015-05-30: 100 mg via ORAL
  Filled 2015-05-29: qty 2

## 2015-05-29 MED ORDER — FENTANYL CITRATE (PF) 250 MCG/5ML IJ SOLN
INTRAMUSCULAR | Status: AC
  Filled 2015-05-29: qty 5

## 2015-05-29 MED ORDER — POTASSIUM CHLORIDE IN NACL 20-0.9 MEQ/L-% IV SOLN
INTRAVENOUS | Status: DC
  Administered 2015-05-29: 17:00:00 via INTRAVENOUS
  Filled 2015-05-29: qty 1000

## 2015-05-29 MED ORDER — NITROGLYCERIN 0.4 MG SL SUBL: 0.4000 mg | SUBLINGUAL_TABLET | SUBLINGUAL | Status: DC | PRN

## 2015-05-29 MED ORDER — HYDROMORPHONE HCL 1 MG/ML IJ SOLN
INTRAMUSCULAR | Status: AC
Start: 2015-05-29 — End: 2015-05-30
  Filled 2015-05-29: qty 1

## 2015-05-29 MED ORDER — MOMETASONE FURO-FORMOTEROL FUM 200-5 MCG/ACT IN AERO
2.0000 | INHALATION_SPRAY | Freq: Two times a day (BID) | RESPIRATORY_TRACT | Status: DC | PRN
  Filled 2015-05-29: qty 8.8

## 2015-05-29 MED ORDER — CEFAZOLIN SODIUM 1-5 GM-% IV SOLN
1.0000 g | Freq: Three times a day (TID) | INTRAVENOUS | Status: AC
  Administered 2015-05-29 – 2015-05-30 (×2): 1 g via INTRAVENOUS
  Filled 2015-05-29 (×2): qty 50

## 2015-05-29 MED ORDER — BUPIVACAINE HCL 0.5 % IJ SOLN
INTRAMUSCULAR | Status: DC | PRN
  Administered 2015-05-29: 20 mL

## 2015-05-29 MED ORDER — PRAVASTATIN SODIUM 20 MG PO TABS: 20.0000 mg | ORAL_TABLET | Freq: Every day | ORAL | Status: DC

## 2015-05-29 MED ORDER — PANTOPRAZOLE SODIUM 40 MG PO TBEC
40.0000 mg | DELAYED_RELEASE_TABLET | Freq: Every day | ORAL | Status: DC
  Administered 2015-05-30: 40 mg via ORAL
  Filled 2015-05-29: qty 1

## 2015-05-29 MED ORDER — OXYCODONE-ACETAMINOPHEN 5-325 MG PO TABS
1.0000 | ORAL_TABLET | ORAL | Status: DC | PRN
  Administered 2015-05-29 – 2015-05-30 (×3): 2 via ORAL
  Filled 2015-05-29 (×3): qty 2

## 2015-05-29 MED ORDER — PROPOFOL 10 MG/ML IV BOLUS
INTRAVENOUS | Status: DC | PRN
  Administered 2015-05-29: 200 mg via INTRAVENOUS

## 2015-05-29 MED ORDER — SODIUM CHLORIDE 0.9 % IJ SOLN
3.0000 mL | Freq: Two times a day (BID) | INTRAMUSCULAR | Status: DC
  Administered 2015-05-29 – 2015-05-30 (×2): 3 mL via INTRAVENOUS

## 2015-05-29 MED ORDER — OXYCODONE HCL 5 MG PO TABS
ORAL_TABLET | ORAL | Status: AC
  Filled 2015-05-29: qty 1

## 2015-05-29 MED ORDER — ONDANSETRON 4 MG PO TBDP: 4.0000 mg | ORAL_TABLET | Freq: Three times a day (TID) | ORAL | Status: DC | PRN

## 2015-05-29 MED ORDER — INSULIN ASPART 100 UNIT/ML ~~LOC~~ SOLN: 0.0000 [IU] | SUBCUTANEOUS | Status: DC

## 2015-05-29 MED ORDER — SODIUM CHLORIDE 0.9 % IV SOLN: 250.0000 mL | INTRAVENOUS | Status: DC

## 2015-05-29 MED ORDER — THROMBIN 20000 UNITS EX KIT
PACK | CUTANEOUS | Status: DC | PRN
  Administered 2015-05-29: 20000 [IU] via TOPICAL

## 2015-05-29 MED ORDER — ACETAMINOPHEN 325 MG PO TABS: 650.0000 mg | ORAL_TABLET | ORAL | Status: DC | PRN

## 2015-05-29 MED ORDER — PHENYLEPHRINE 40 MCG/ML (10ML) SYRINGE FOR IV PUSH (FOR BLOOD PRESSURE SUPPORT)
PREFILLED_SYRINGE | INTRAVENOUS | Status: AC
  Filled 2015-05-29: qty 10

## 2015-05-29 MED ORDER — SUCRALFATE 1 G PO TABS
1.0000 g | ORAL_TABLET | Freq: Three times a day (TID) | ORAL | Status: DC
  Administered 2015-05-29 – 2015-05-30 (×3): 1 g via ORAL
  Filled 2015-05-29 (×3): qty 1

## 2015-05-29 MED ORDER — LACTATED RINGERS IV SOLN
INTRAVENOUS | Status: DC
  Administered 2015-05-29 (×2): via INTRAVENOUS

## 2015-05-29 MED ORDER — LIDOCAINE HCL (CARDIAC) 20 MG/ML IV SOLN
INTRAVENOUS | Status: AC
  Filled 2015-05-29: qty 10

## 2015-05-29 MED ORDER — HYDROMORPHONE HCL 1 MG/ML IJ SOLN: 0.5000 mg | INTRAMUSCULAR | Status: DC | PRN

## 2015-05-29 MED ORDER — PROPOFOL 10 MG/ML IV BOLUS
INTRAVENOUS | Status: AC
  Filled 2015-05-29: qty 20

## 2015-05-29 MED ORDER — ONDANSETRON HCL 4 MG/2ML IJ SOLN
INTRAMUSCULAR | Status: AC
  Filled 2015-05-29: qty 4

## 2015-05-29 MED ORDER — THROMBIN 20000 UNITS EX SOLR
CUTANEOUS | Status: AC
  Filled 2015-05-29: qty 20000

## 2015-05-29 MED ORDER — DOCUSATE SODIUM 100 MG PO CAPS
100.0000 mg | ORAL_CAPSULE | Freq: Two times a day (BID) | ORAL | Status: DC
  Administered 2015-05-29 – 2015-05-30 (×2): 100 mg via ORAL
  Filled 2015-05-29 (×2): qty 1

## 2015-05-29 MED ORDER — VITAMIN D 1000 UNITS PO TABS
6000.0000 [IU] | ORAL_TABLET | Freq: Every day | ORAL | Status: DC
  Administered 2015-05-30: 6000 [IU] via ORAL
  Filled 2015-05-29: qty 6

## 2015-05-29 MED ORDER — POLYETHYLENE GLYCOL 3350 17 G PO PACK
17.0000 g | PACK | Freq: Two times a day (BID) | ORAL | Status: DC
  Administered 2015-05-29 – 2015-05-30 (×2): 17 g via ORAL
  Filled 2015-05-29 (×2): qty 1

## 2015-05-29 MED ORDER — PHENOL 1.4 % MT LIQD
1.0000 | OROMUCOSAL | Status: DC | PRN
Start: 2015-05-29 — End: 2015-05-30

## 2015-05-29 MED ORDER — MINERAL OIL LIGHT 100 % EX OIL
TOPICAL_OIL | CUTANEOUS | Status: DC | PRN
  Administered 2015-05-29: 1 via TOPICAL

## 2015-05-29 MED ORDER — NEOSTIGMINE METHYLSULFATE 10 MG/10ML IV SOLN
INTRAVENOUS | Status: DC | PRN
  Administered 2015-05-29: 2 mg via INTRAVENOUS

## 2015-05-29 MED ORDER — GLUCERNA SHAKE PO LIQD
237.0000 mL | Freq: Three times a day (TID) | ORAL | Status: DC
  Administered 2015-05-30: 237 mL via ORAL
  Filled 2015-05-29 (×6): qty 237

## 2015-05-29 MED ORDER — ONDANSETRON 8 MG PO TBDP: 8.0000 mg | ORAL_TABLET | Freq: Three times a day (TID) | ORAL | Status: DC | PRN

## 2015-05-29 MED ORDER — LIDOCAINE 5 % EX PTCH
1.0000 | MEDICATED_PATCH | CUTANEOUS | Status: DC
  Filled 2015-05-29: qty 1

## 2015-05-29 MED ORDER — BUPIVACAINE LIPOSOME 1.3 % IJ SUSP
INTRAMUSCULAR | Status: DC | PRN
  Administered 2015-05-29: 20 mL

## 2015-05-29 MED ORDER — POTASSIUM CHLORIDE CRYS ER 20 MEQ PO TBCR
20.0000 meq | EXTENDED_RELEASE_TABLET | Freq: Two times a day (BID) | ORAL | Status: DC
  Administered 2015-05-29 – 2015-05-30 (×2): 20 meq via ORAL
  Filled 2015-05-29 (×2): qty 1

## 2015-05-29 MED ORDER — SENNA 8.6 MG PO TABS
1.0000 | ORAL_TABLET | Freq: Two times a day (BID) | ORAL | Status: DC
  Administered 2015-05-29 – 2015-05-30 (×2): 8.6 mg via ORAL
  Filled 2015-05-29 (×2): qty 1

## 2015-05-29 MED ORDER — ARTIFICIAL TEARS OP OINT
TOPICAL_OINTMENT | OPHTHALMIC | Status: DC | PRN
  Administered 2015-05-29: 1 via OPHTHALMIC

## 2015-05-29 MED ORDER — AMLODIPINE BESYLATE 10 MG PO TABS: 10.0000 mg | ORAL_TABLET | Freq: Every day | ORAL | Status: DC

## 2015-05-29 MED ORDER — BUPIVACAINE LIPOSOME 1.3 % IJ SUSP
20.0000 mL | INTRAMUSCULAR | Status: DC
  Filled 2015-05-29: qty 20

## 2015-05-29 MED ORDER — DOCUSATE SODIUM 50 MG PO CAPS
250.0000 mg | ORAL_CAPSULE | Freq: Two times a day (BID) | ORAL | Status: DC
  Filled 2015-05-29: qty 1

## 2015-05-29 MED ORDER — ALBUTEROL SULFATE HFA 108 (90 BASE) MCG/ACT IN AERS
1.0000 | INHALATION_SPRAY | Freq: Four times a day (QID) | RESPIRATORY_TRACT | Status: DC | PRN
Start: 2015-05-29 — End: 2015-05-29

## 2015-05-29 MED ORDER — BUPIVACAINE HCL (PF) 0.5 % IJ SOLN
INTRAMUSCULAR | Status: AC
  Filled 2015-05-29: qty 30

## 2015-05-29 MED ORDER — METOPROLOL TARTRATE 50 MG PO TABS
50.0000 mg | ORAL_TABLET | Freq: Two times a day (BID) | ORAL | Status: DC
  Administered 2015-05-29 – 2015-05-30 (×2): 50 mg via ORAL
  Filled 2015-05-29 (×2): qty 1

## 2015-05-29 SURGICAL SUPPLY — 56 items
ADH SKN CLS APL DERMABOND .7 (GAUZE/BANDAGES/DRESSINGS) ×1
BUR RND FLUTED 2.5 (BURR) IMPLANT
BUR SABER RD CUTTING 3.0 (BURR) ×1 IMPLANT
BUR SABER RD CUTTING 3.0MM (BURR) ×1
CANISTER SUCTION 2500CC (MISCELLANEOUS) ×3 IMPLANT
CLOSURE STERI-STRIP 1/2X4 (GAUZE/BANDAGES/DRESSINGS) ×1
CLSR STERI-STRIP ANTIMIC 1/2X4 (GAUZE/BANDAGES/DRESSINGS) ×1 IMPLANT
COVER SURGICAL LIGHT HANDLE (MISCELLANEOUS) ×3 IMPLANT
DERMABOND ADVANCED (GAUZE/BANDAGES/DRESSINGS) ×2
DERMABOND ADVANCED .7 DNX12 (GAUZE/BANDAGES/DRESSINGS) ×1 IMPLANT
DRAPE INCISE IOBAN 66X45 STRL (DRAPES) IMPLANT
DRAPE MICROSCOPE LEICA (MISCELLANEOUS) ×3 IMPLANT
DRAPE PROXIMA HALF (DRAPES) IMPLANT
DRAPE SURG 17X23 STRL (DRAPES) ×12 IMPLANT
DRSG MEPILEX BORDER 4X4 (GAUZE/BANDAGES/DRESSINGS) ×2 IMPLANT
DRSG MEPILEX BORDER 4X8 (GAUZE/BANDAGES/DRESSINGS) IMPLANT
DURAPREP 26ML APPLICATOR (WOUND CARE) ×3 IMPLANT
ELECT BLADE 4.0 EZ CLEAN MEGAD (MISCELLANEOUS) ×3
ELECT REM PT RETURN 9FT ADLT (ELECTROSURGICAL) ×3
ELECTRODE BLDE 4.0 EZ CLN MEGD (MISCELLANEOUS) IMPLANT
ELECTRODE REM PT RTRN 9FT ADLT (ELECTROSURGICAL) ×1 IMPLANT
EVACUATOR 1/8 PVC DRAIN (DRAIN) IMPLANT
GLOVE BIOGEL PI IND STRL 8 (GLOVE) ×1 IMPLANT
GLOVE BIOGEL PI INDICATOR 8 (GLOVE) ×2
GLOVE ECLIPSE 9.0 STRL (GLOVE) ×3 IMPLANT
GLOVE ORTHO TXT STRL SZ7.5 (GLOVE) ×3 IMPLANT
GLOVE SURG 8.5 LATEX PF (GLOVE) ×3 IMPLANT
GOWN STRL REUS W/ TWL LRG LVL3 (GOWN DISPOSABLE) ×1 IMPLANT
GOWN STRL REUS W/TWL 2XL LVL3 (GOWN DISPOSABLE) ×6 IMPLANT
GOWN STRL REUS W/TWL LRG LVL3 (GOWN DISPOSABLE) ×3
KIT BASIN OR (CUSTOM PROCEDURE TRAY) ×3 IMPLANT
KIT ROOM TURNOVER OR (KITS) ×3 IMPLANT
NDL SPNL 18GX3.5 QUINCKE PK (NEEDLE) ×2 IMPLANT
NEEDLE SPNL 18GX3.5 QUINCKE PK (NEEDLE) ×6 IMPLANT
NS IRRIG 1000ML POUR BTL (IV SOLUTION) ×3 IMPLANT
PACK LAMINECTOMY ORTHO (CUSTOM PROCEDURE TRAY) ×3 IMPLANT
PAD ARMBOARD 7.5X6 YLW CONV (MISCELLANEOUS) ×6 IMPLANT
PATTIES SURGICAL .5 X.5 (GAUZE/BANDAGES/DRESSINGS) IMPLANT
PATTIES SURGICAL .75X.75 (GAUZE/BANDAGES/DRESSINGS) IMPLANT
PATTIES SURGICAL 1X1 (DISPOSABLE) IMPLANT
SPONGE LAP 4X18 X RAY DECT (DISPOSABLE) IMPLANT
SPONGE SURGIFOAM ABS GEL 100 (HEMOSTASIS) IMPLANT
SUT VIC AB 0 CT1 27 (SUTURE) ×3
SUT VIC AB 0 CT1 27XBRD ANBCTR (SUTURE) IMPLANT
SUT VIC AB 1 CT1 27 (SUTURE)
SUT VIC AB 1 CT1 27XBRD ANBCTR (SUTURE) IMPLANT
SUT VIC AB 1 CTX 36 (SUTURE) ×3
SUT VIC AB 1 CTX36XBRD ANBCTR (SUTURE) IMPLANT
SUT VIC AB 2-0 CT1 27 (SUTURE) ×3
SUT VIC AB 2-0 CT1 TAPERPNT 27 (SUTURE) IMPLANT
SUT VIC AB 3-0 X1 27 (SUTURE) ×3 IMPLANT
SUT VICRYL 0 UR6 27IN ABS (SUTURE) ×3 IMPLANT
TOWEL OR 17X24 6PK STRL BLUE (TOWEL DISPOSABLE) ×3 IMPLANT
TOWEL OR 17X26 10 PK STRL BLUE (TOWEL DISPOSABLE) ×3 IMPLANT
TRAY FOLEY CATH 16FRSI W/METER (SET/KITS/TRAYS/PACK) IMPLANT
WATER STERILE IRR 1000ML POUR (IV SOLUTION) ×3 IMPLANT

## 2015-05-29 NOTE — Anesthesia Preprocedure Evaluation (Signed)
Anesthesia Evaluation  Patient identified by MRN, date of birth, ID band Patient awake    Reviewed: Allergy & Precautions, NPO status , Patient's Chart, lab work & pertinent test results  History of Anesthesia Complications (+) PONV  Airway Mallampati: I  TM Distance: >3 FB     Dental   Pulmonary asthma ,    Pulmonary exam normal        Cardiovascular hypertension, + CAD  Normal cardiovascular exam     Neuro/Psych Anxiety Depression TIA Neuromuscular disease    GI/Hepatic hiatal hernia, GERD  ,  Endo/Other    Renal/GU      Musculoskeletal  (+) Arthritis ,   Abdominal   Peds  Hematology   Anesthesia Other Findings   Reproductive/Obstetrics                             Anesthesia Physical Anesthesia Plan  ASA: III  Anesthesia Plan: General   Post-op Pain Management:    Induction: Intravenous  Airway Management Planned: Oral ETT  Additional Equipment:   Intra-op Plan:   Post-operative Plan: Extubation in OR  Informed Consent: I have reviewed the patients History and Physical, chart, labs and discussed the procedure including the risks, benefits and alternatives for the proposed anesthesia with the patient or authorized representative who has indicated his/her understanding and acceptance.     Plan Discussed with: CRNA, Anesthesiologist and Surgeon  Anesthesia Plan Comments:         Anesthesia Quick Evaluation

## 2015-05-29 NOTE — Progress Notes (Signed)
Pt continues to complain of nausea.  She received zofran IV and does not feel she can swallow a compazine pill.  Dr. Sharol Given notified and ordered IV.  Will administer and continue to monitor.

## 2015-05-29 NOTE — Anesthesia Procedure Notes (Signed)
Procedure Name: Intubation Date/Time: 05/29/2015 1:15 PM Performed by: Scheryl Darter Pre-anesthesia Checklist: Patient identified, Emergency Drugs available, Suction available, Patient being monitored and Timeout performed Patient Re-evaluated:Patient Re-evaluated prior to inductionOxygen Delivery Method: Circle system utilized Preoxygenation: Pre-oxygenation with 100% oxygen Intubation Type: IV induction Ventilation: Mask ventilation without difficulty Laryngoscope Size: Miller and 2 Grade View: Grade I Tube type: Oral Tube size: 7.5 mm Number of attempts: 1 Airway Equipment and Method: Stylet Placement Confirmation: ETT inserted through vocal cords under direct vision,  positive ETCO2 and breath sounds checked- equal and bilateral Secured at: 22 cm Tube secured with: Tape Dental Injury: Teeth and Oropharynx as per pre-operative assessment

## 2015-05-29 NOTE — Transfer of Care (Signed)
Immediate Anesthesia Transfer of Care Note  Patient: [name] [name]  Procedure(s) Performed: Procedure(s): RIGHT L2-3 MICRODISCECTOMY (N/A)  Patient Location: PACU  Anesthesia Type:General  Level of Consciousness: awake, alert  and oriented  Airway & Oxygen Therapy: Patient Spontanous Breathing and Patient connected to nasal cannula oxygen  Post-op Assessment: Report given to RN, Post -op Vital signs reviewed and stable and Patient moving all extremities X 4  Post vital signs: Reviewed and stable  Last Vitals:  Filed Vitals:   05/29/15 1036  BP: 169/73  Pulse: 73  Temp: 36.8 C  Resp: 18    Complications: No apparent anesthesia complications

## 2015-05-29 NOTE — Interval H&P Note (Signed)
The patient has been re-examined, and the chart reviewed, and there have been no interval changes to the documented history and physical.    The risks, benefits, and alternatives have been discussed at length, and the patient is willing to proceed.   

## 2015-05-29 NOTE — Progress Notes (Signed)
Pt admitted to 5C20 from PACU.  Pt complains of nausea.  Previously medicated for nausea in OR and PACU before transfer to [facility].  Family at bedside.  Pt alert and oriented.  No complaints other than nausea.  Denies numbness and tingling to extremities.  Her dressing is CDI.  Drinking ginger ale without problems.  Will continue to monitor.

## 2015-05-29 NOTE — Anesthesia Postprocedure Evaluation (Signed)
Anesthesia Post Note  Patient: [name] [name]  Procedure(s) Performed: Procedure(s) (LRB): RIGHT L2-3 MICRODISCECTOMY (N/A)  Patient location during evaluation: PACU Anesthesia Type: General Level of consciousness: awake, oriented, sedated and patient cooperative Pain management: pain level controlled Vital Signs Assessment: post-procedure vital signs reviewed and stable Respiratory status: spontaneous breathing and respiratory function stable Cardiovascular status: blood pressure returned to baseline and stable Anesthetic complications: no    Last Vitals:  Filed Vitals:   05/29/15 1643 05/29/15 1645  BP:  142/71  Pulse: 87 76  Temp:    Resp: 16 17    Last Pain:  Filed Vitals:   05/29/15 1658  PainSc: 5                  Cassius Cullinane EDWARD

## 2015-05-29 NOTE — Op Note (Signed)
05/29/2015  1:16 PM  PATIENT:  [name] [name]  80 y.o. female  MRN: [id number]  OPERATIVE REPORT  PRE-OPERATIVE DIAGNOSIS:  right L2-3 herniated nucleus pulposus  POST-OPERATIVE DIAGNOSIS:  right L2-3 lateral recess stenosis, due to disc herniation, end plate spur and PMMA cement.  PROCEDURE:  Procedure(s): RIGHT L2-3 MICRODISCECTOMY    SURGEON:  Jessy Oto, MD     ASSISTANT:  Benjiman Core, PA-C  (Present throughout the entire procedure and necessary for completion of procedure in a timely manner)     ANESTHESIA:  General,supplemented with local anesthetic marcaine 0.5% 1:1 exparel total 30cc  Dr. Sherren Kerns.    COMPLICATIONS:  None.     EBL: 75 cc  DRAINS: none.  PROCEDURE:The patient was met in the holding area, and the appropriate right Lumbar level L2-3 identified and marked with "x" and my initials.The patient was then transported to OR and was placed under general anesthesia without difficulty. The patient received appropriate preoperative antibiotic prophylaxis. The patient after intubation atraumatically was transferred to the operating room table, prone position, Wilson frame, sliding OR table. All pressure points were well padded. The arms in 90-90 well-padded at the elbows. Standard prep with DuraPrep solution lower dorsal spine to the mid sacral segment. Draped in the usual manner iodine Vi-Drape was used. Time-out procedure was called and correct. An 18-gauge spinal needle was then inserted at the expected L2-3 level.  Cross table radiograph was used to identify the spinal needle positions. The needle was at the lower aspect of the lamina of L3. Skin superior to this was then infiltrated withsupplemented with local anesthetic marcaine 0.5% 1:1 exparel total 30cc. An incision approximately an inch inch and a half in length was then made through skin and subcutaneous layers in line with the right side of the expected midline just superior to the spinal needle entry point.  An incision made into the right lumbosacral fascia approximately an inch in length .  Cobb elevator used to carefully form subperiosteal movement of the hip paralumbar muscles off of the posterior lamina of the expected L2-3 level. 50 mm retractors and placed on the scaffolding for the [facility] equipment and guided down to and docking on the posterior aspect of the lamina at the expected L2-3 level.  Lateral radiograph was used identifing the Penfield #4 at the level L1-2 facet. Exposure was then carried inferiorly and additional 1 level. Tractors were placed at this level. The operating room microscope sterilely draped brought into the field. Under the operating room microscope, the right L2-3 interspace carefully debrided the small amount of muscle attachment here and high-speed bur used to drill the medial aspect of the inferior articular process of L5 approximately 20%. A localization lateral radiograph was obtained with Penfield 4 in the L2-3 facet. 2 mm Kerrison then used to enter the spinal canal over the superior aspect of the L3 lamina carefully using the Kerrison to debris the attachment as a curet. Foraminotomy was then performed over L3 nerve root. The medial 20% superior articular process of L3 and then resected using 2 mm Kerrison. This allowed for identification of the thecal sac. Penfield 4 was then used to carefully mobilize the thecal sac medially and the right L3 nerve root identified within the lateral recess flattened over the posterior aspect of the herniated disc. Carefully the lateral aspect of the L3 nerve root was identified and a Penfield 4 was used to mobilize the nerve medially such that the herniated disc was visible with  microscope. Using a Penfield 4 for retraction the disc was examined and found to be hard consistent with either end plate spur or methylmethacrylate cement and a 15 blade scalpel was unable to incise the posterior longitudinal ligament within the lateral recess on the  left side longitudinally. A quarter inch osteotome was then used to perform daily oriented cuts into the disc--cement over the right lateral recess. Disc material in addition to an plate spur and cement was removed using micropituitary rongeurs and nerve hook nerve root and then more easily able to be mobilized medially and retracted using a love retractor. Further foraminotomies was performed over the L2 nerve root the nerve root was noted to be well decompressed without further compression. The L3 nerve root able to be retracted along the medial aspect of the L3 pedicle and there was no disc material noted to be extending inferior to the right L3-4 disc space.. Ligamentum flavum was further debrided superiorly to the level L2-3 disc. Had a moderate amount of further resection of the L3 lamina inferiorly was performed. With this then the disc space at L2-3 was easily visualized Small amount of further disc material was found subligamentous extending out the neuroforamen for L2 . Using Epstein then this was freed up into the disc space and then resected using pituitary rongeurs until blunt tipped nerve hook was able be passed out the L2 neuroforamen demonstrating that this was well decompressed.  Bleeding was then controlled using thrombin-soaked Gelfoam small cottonoids.  Small amount of bleeding within the soft tissue mass the laminotomy area was controlled using bipolar electrocautery. Irrigation was carried out using copious amounts of irrigant solution. All Gelfoam  were then removed. No significant active bleeding present at the time of removal. All instruments sponge counts were correct traction system was then carefully removed using bipolar electrocautery of any small bleeders. Lumbodorsal fascia was then carefully approximated with interrupted 0 Vicryl sutures, UR 6 needle deep subcutaneous layers were approximated with interrupted 0 Vicryl sutures on UR 6 the appear subcutaneous layers approximated with  interrupted 2-0 Vicryl sutures and the skin closed with a running subcutaneous stitch of 4-0 Vicryl. Dermabond was applied allowed to dry and then Mepilex bandage applied. Patient was then carefully returned to supine position on a stretcher, reactivated and extubated. He was then returned to recovery room in satisfactory condition.  Benjiman Core, PA-C perform the duties of assistant surgeon during this case. he was present from the beginning of the case to the end of the case assisting in transfer the patient from his stretcher to the OR table and back to the stretcher at the end of the case. Assisted in careful retraction and suction of the laminectomy site delicate neural structures operating under the operating room microscope. He performed closure of the incision from the fascia to the skin applying the dressing.     NITKA,JAMES E 05/29/2015, 1:16 PM

## 2015-05-29 NOTE — Brief Op Note (Addendum)
05/29/2015  1:15 PM  PATIENT:  [name] [name]  80 y.o. female  PRE-OPERATIVE DIAGNOSIS:  right L2-3 herniated nucleus pulposus  POST-OPERATIVE DIAGNOSIS:  Right lateral recess stenosis with combination, disc, endplate spur and PMMA cement.  PROCEDURE:  Procedure(s): RIGHT L2-3 MICRODISCECTOMY  SURGEON:  [name], MD  PHYSICIAN ASSISTANT: Benjiman Core, PA-C  ANESTHESIA:   General, supplemented with local anesthetic marcaine 0.5% 1:1 exparel total 30cc  Dr. Sherren Kerns.  EBL:,75cc  FINDINGS:Right lateral recess stenosis due to spur off posterior superior end plate L3, disc protusion and some small amount of PMM cement.  DRAINS: none   DICTATION ID:  [id number],[id number]

## 2015-05-29 NOTE — Discharge Instructions (Signed)
° ° °  No lifting greater than 10 lbs. °Avoid bending, stooping and twisting. °Walk in house for first week them may start to get out slowly increasing distance up to one quarter mile by 3 weeks post op. °Keep incision dry for 3 days, may use tegaderm or similar water impervious dressing. ° °

## 2015-05-30 DIAGNOSIS — M5126 Other intervertebral disc displacement, lumbar region: Secondary | ICD-10-CM | POA: Diagnosis not present

## 2015-05-30 LAB — BASIC METABOLIC PANEL
ANION GAP: 11 (ref 5–15)
BUN: 6 mg/dL (ref 6–20)
CHLORIDE: 105 mmol/L (ref 101–111)
CO2: 24 mmol/L (ref 22–32)
Calcium: 8.7 mg/dL — ABNORMAL LOW (ref 8.9–10.3)
Creatinine, Ser: 0.7 mg/dL (ref 0.44–1.00)
GFR calc Af Amer: >60 mL/min (ref >60)
GLUCOSE: 104 mg/dL — AB (ref 65–99)
POTASSIUM: 4.1 mmol/L (ref 3.5–5.1)
Sodium: 140 mmol/L (ref 135–145)

## 2015-05-30 LAB — GLUCOSE, CAPILLARY
Glucose-Capillary: 98 mg/dL (ref 65–99)
Glucose-Capillary: 81 mg/dL (ref 65–99)

## 2015-05-30 MED ORDER — ORPHENADRINE CITRATE ER 100 MG PO TB12: 100.0000 mg | ORAL_TABLET | Freq: Two times a day (BID) | ORAL | Status: DC | PRN

## 2015-05-30 MED ORDER — PROCHLORPERAZINE EDISYLATE 5 MG/ML IJ SOLN: 5.0000 mg | Freq: Four times a day (QID) | INTRAMUSCULAR | Status: DC | PRN

## 2015-05-30 MED ORDER — OXYCODONE-ACETAMINOPHEN 5-325 MG PO TABS: 1.0000 | ORAL_TABLET | ORAL | Status: DC | PRN

## 2015-05-30 NOTE — Discharge Summary (Signed)
Physician Discharge Summary      Patient ID: [name] [name] MRN: [id number] DOB/AGE: 09-15-1935 80 y.o.  Admit date: 05/29/2015 Discharge date:05/29/2014   Admission Diagnoses:  Principal Problem:   HNP (herniated nucleus pulposus), lumbar Active Problems:   Spinal stenosis, lumbar region, with neurogenic claudication   Discharge Diagnoses:  Same  Past Medical History  Diagnosis Date  . CAD (coronary artery disease)     LHC 2003 with 40% pLAD, 30% mLAD, 100% D1 (moderate vessel), 100%  D2 (small vessel), 95% mid small OM2.  Pt had PTCA to D1;  D2 and OM2 small vessels and tx medically;  Last Myoview (2012): anterior infarct seen with scar, no ischemia. EF normal. Pt managed medically;  Lexiscan Myoview (12/14):  Low risk; ant scar with very mild peri-infarct ischemia; EF 55% with ant AK   . Hyperlipidemia     takes Pravastatin daily  . TIA (transient ischemic attack)     no residual effects noted  . DIVERTICULOSIS, COLON 08/28/2008    Qualifier: Diagnosis of  By: [name], [name], [name]    . Chronic nausea     takes Zofran daily as needed  . Chronic back pain   . PONV (postoperative nausea and vomiting)   . CAD (coronary artery disease)   . Osteoporosis   . Anxiety     takes Xanax daily as needed  . Asthma     Albuterol daily as needed  . Constipation     takes Senokot and Miralax daily  . Hypertension     takes Amlodipine,Metoprolol,and Losartan daily  . History of bronchitis several of yrs ago  . History of vertigo     no meds  . Chronic back pain     HNP  . Osteopenia   . Hiatal hernia     hx of  . GERD (gastroesophageal reflux disease)     hx of  . History of colon polyps     benign  . Slow urinary stream     takes Rapaflo daily    Surgeries: Procedure(s): RIGHT L2-3 MICRODISCECTOMY on 05/29/2015   Consultants:    Discharged Condition: Improved  Hospital Course: [name] [name] is an 80 y.o. female who was admitted 05/29/2015 with a chief complaint of No  chief complaint on file. , and found to have a diagnosis of HNP (herniated nucleus pulposus), lumbar.  She was brought to the operating room on 05/29/2015 and underwent the above named procedures.    She was given perioperative antibiotics:  Anti-infectives    Start     Dose/Rate Route Frequency Ordered Stop   05/29/15 2000  ceFAZolin (ANCEF) IVPB 1 g/50 mL premix     1 g 100 mL/hr over 30 Minutes Intravenous Every 8 hours 05/29/15 1603 05/30/15 0447   05/29/15 0600  ceFAZolin (ANCEF) IVPB 2 g/50 mL premix     2 g 100 mL/hr over 30 Minutes Intravenous On call to O.R. 05/28/15 1350 05/29/15 1316    POD#1 had some nausea and IVF continued for evening this has clear late morning and she is awake, alert and oriented x4. Legs are neurologically normal. Hip flexion strength has returned to normal. Dressing changed, no drainage or erythrema. PT/OT to see and plan then to discharge her home today.  She was given sequential compression devices and early ambulation for DVT prophylaxis.  She benefited maximally from their hospital stay and there were no complications.    Recent vital signs:  Filed Vitals:   05/30/15  0400 05/30/15 0950  BP: 131/74 139/71  Pulse: 74 68  Temp: 98.7 F (37.1 C) 97.9 F (36.6 C)  Resp: 18 20    Recent laboratory studies:  Results for orders placed or performed during the hospital encounter of [id number]  Basic Metabolic Panel  Result Value Ref Range   Sodium 140 135 - 145 mmol/L   Potassium 4.1 3.5 - 5.1 mmol/L   Chloride 105 101 - 111 mmol/L   CO2 24 22 - 32 mmol/L   Glucose, Bld 104 (H) 65 - 99 mg/dL   BUN 6 6 - 20 mg/dL   Creatinine, Ser 0.70 0.44 - 1.00 mg/dL   Calcium 8.7 (L) 8.9 - 10.3 mg/dL   GFR calc non Af Amer >60 >60 mL/min   GFR calc Af Amer >60 >60 mL/min   Anion gap 11 5 - 15  Glucose, capillary  Result Value Ref Range   Glucose-Capillary 150 (H) 65 - 99 mg/dL  Glucose, capillary  Result Value Ref Range   Glucose-Capillary 98 65 - 99  mg/dL    Discharge Medications:     Medication List    STOP taking these medications        aspirin EC 81 MG tablet      TAKE these medications        albuterol 108 (90 Base) MCG/ACT inhaler  Commonly known as:  PROVENTIL HFA;VENTOLIN HFA  Inhale 1-2 puffs into the lungs every 6 (six) hours as needed for wheezing or shortness of breath.     alendronate 70 MG tablet  Commonly known as:  FOSAMAX  Take 70 mg by mouth once a week. Take with a full glass of water on an empty stomach. Take on Mondays     ALPRAZolam 0.5 MG tablet  Commonly known as:  XANAX  Take 1/2 to 1 tablet 2 or 3 x daily if needed for nerves     amLODipine 10 MG tablet  Commonly known as:  NORVASC  Take 1 tablet (10 mg total) by mouth at bedtime.     amLODipine 10 MG tablet  Commonly known as:  NORVASC  TAKE ONE TABLET BY MOUTH ONCE DAILY FOR BLOOD PRESSURE     docusate sodium 250 MG capsule  Commonly known as:  COLACE  Take 1 capsule (250 mg total) by mouth 2 (two) times daily.     feeding supplement (GLUCERNA SHAKE) Liqd  Take 237 mLs by mouth 3 (three) times daily between meals.     ipratropium-albuterol 0.5-2.5 (3) MG/3ML Soln  Commonly known as:  DUONEB  USE ONE VIAL IN NEBULIZER 4 TIMES DAILY     lidocaine 5 %  Commonly known as:  LIDODERM  Place 1 patch onto the skin daily. Remove & Discard patch within 12 hours or as directed by MD     losartan 100 MG tablet  Commonly known as:  COZAAR  TAKE ONE TABLET BY MOUTH ONCE DAILY (DISCONTINUE  BENICAR)     metFORMIN 500 MG 24 hr tablet  Commonly known as:  GLUCOPHAGE-XR  TAKE ONE TO TWO TABLETS BY MOUTH TWICE DAILY WITH MEALS FOR DIABETES     metoprolol 50 MG tablet  Commonly known as:  LOPRESSOR  TAKE ONE TABLET BY MOUTH TWICE DAILY FOR BLOOD PRESSURE     mometasone-formoterol 200-5 MCG/ACT Aero  Commonly known as:  DULERA  Inhale 2 puffs into the lungs 2 (two) times daily.     nitroGLYCERIN 0.4 MG SL tablet  Commonly known as:  NITROSTAT  Place 1 tablet (0.4 mg total) under the tongue every 5 (five) minutes as needed for chest pain.     ondansetron 4 MG disintegrating tablet  Commonly known as:  ZOFRAN ODT  Take 1 tablet (4 mg total) by mouth every 8 (eight) hours as needed for nausea or vomiting.     ondansetron 8 MG disintegrating tablet  Commonly known as:  ZOFRAN-ODT  DISSOLVE ONE TABLET IN MOUTH EVERY 8 HOURS AS NEEDED FOR NAUSEA AND VOMITING     orphenadrine 100 MG tablet  Commonly known as:  NORFLEX  Take 1 tablet (100 mg total) by mouth 2 (two) times daily as needed for muscle spasms.     OVER THE COUNTER MEDICATION  Take 1,000 mg by mouth 2 (two) times daily. Coral Calcium 1000mg      OVER THE COUNTER MEDICATION  Take 400 mg by mouth 2 (two) times daily. Magnesium Carbonate 400mg      oxyCODONE 5 MG immediate release tablet  Commonly known as:  Oxy IR/ROXICODONE  Take 5 mg by mouth every 6 (six) hours as needed for moderate pain or severe pain.     oxyCODONE-acetaminophen 5-325 MG tablet  Commonly known as:  PERCOCET/ROXICET  Take 1 tablet by mouth every 4 (four) hours as needed for severe pain.     oxyCODONE-acetaminophen 5-325 MG tablet  Commonly known as:  PERCOCET/ROXICET  Take 1-2 tablets by mouth every 4 (four) hours as needed for moderate pain.     pantoprazole 40 MG tablet  Commonly known as:  PROTONIX  Take 1 tablet (40 mg total) by mouth daily.     polyethylene glycol packet  Commonly known as:  MIRALAX / GLYCOLAX  Take 17 g by mouth 2 (two) times daily.     potassium chloride SA 20 MEQ tablet  Commonly known as:  K-DUR,KLOR-CON  Take 1 tablet (20 mEq total) by mouth 2 (two) times daily.     pravastatin 40 MG tablet  Commonly known as:  PRAVACHOL  Take 20 mg by mouth daily.     prochlorperazine 5 MG tablet  Commonly known as:  COMPAZINE  Take 1 capsule 3 x day before meals only if needed for Nausea     prochlorperazine 5 MG/ML injection  Commonly known as:  COMPAZINE    Inject 1 mL (5 mg total) into the vein every 6 (six) hours as needed.     senna 8.6 MG Tabs tablet  Commonly known as:  SENOKOT  Take 1 tablet by mouth 2 (two) times daily.     silodosin 8 MG Caps capsule  Commonly known as:  RAPAFLO  Take 8 mg by mouth daily with breakfast.     sucralfate 1 g tablet  Commonly known as:  CARAFATE  TAKE ONE TABLET BY MOUTH 4 TIMES DAILY BEFORE MEALS AND AT BEDTIME FOR HEARTBURN     tobramycin 0.3 % ophthalmic ointment  Commonly known as:  TOBREX  Place into both eyes 2 (two) times daily. Place a 1/2 inch ribbon of ointment into the lower eyelid.     Vitamin D3 2000 units Tabs  Take 6,000 Units by mouth daily.        Diagnostic Studies: Dg Lumbar Spine 2-3 Views  05/29/2015  CLINICAL DATA:  L2-3 right-sided microdiscectomy, intraoperative localization EXAM: LUMBAR SPINE - 2-3 VIEW COMPARISON:  04/08/2015 MRI FINDINGS: Image 1 shows localization probe between the spinous process ease of L2 and L3. Image 2 shows localization probe at the level of  the L2 pedicles. Image through the shows localization problem projecting over the L2-3 facet joint. IMPRESSION: Intraoperative localization Electronically Signed   By: Skipper Cliche M.D.   On: 05/29/2015 18:59    Disposition: 01-Home or Self Care      Discharge Instructions    Call MD / Call 911    Complete by:  As directed   If you experience chest pain or shortness of breath, CALL 911 and be transported to the hospital emergency room.  If you develope a fever above 101 F, pus (white drainage) or increased drainage or redness at the wound, or calf pain, call your surgeon's office.     Constipation Prevention    Complete by:  As directed   Drink plenty of fluids.  Prune juice may be helpful.  You may use a stool softener, such as Colace (over the counter) 100 mg twice a day.  Use MiraLax (over the counter) for constipation as needed.     Diet - low sodium heart healthy    Complete by:  As directed       Discharge instructions    Complete by:  As directed   No lifting greater than 10 lbs. Avoid bending, stooping and twisting. Walk in house for first week them may start to get out slowly increasing distance up to one quarter mile by 3 weeks post op. Keep incision dry for 3 days, may use tegaderm or similar water impervious dressing.     Driving restrictions    Complete by:  As directed   No driving for 6 weeks     Increase activity slowly as tolerated    Complete by:  As directed      Lifting restrictions    Complete by:  As directed   No lifting for indefinitely           Follow-up Information    Follow up with Tanika Bracco E, MD In 2 weeks.   Specialty:  Orthopedic Surgery   Contact information:   [place] [address] [phone]       Follow up with [facility].   Why:  home health physical therapy   Contact information:   [place] [address] [phone]        Signed: Jessy Oto 05/30/2015, 11:06 AM

## 2015-05-30 NOTE — Progress Notes (Signed)
Physical Therapy Evaluation Patient Details Name: [name] [name] MRN: [id number] DOB: [birth year] Today's Date: 05/30/2015   History of Present Illness  80 yo female post L2-3 microdiscectomy on 05/29/15. History of back pain, DM, CAD.  Clinical Impression  Patient presents with pain, requires MIN assist for bed mobility and for stairs training. Supervision for other transfers and gait using RW. Instruction with good return demonstration for spine precautions.  Patient will benefit from skilled PT services for general mobility as pain starts to decline, but can continue recovery at home when medically ready for discharge.  Good candidate for rehabilitation.    Follow Up Recommendations Home health PT    Equipment Recommendations  None recommended by PT (Has equipment at home already.)    Recommendations for Other Services       Precautions / Restrictions Precautions Precautions: Back Precaution Booklet Issued: Yes (comment) Restrictions Weight Bearing Restrictions: No      Mobility  Bed Mobility Overal bed mobility: Needs Assistance Bed Mobility: Rolling;Sidelying to Sit Rolling: Min guard Sidelying to sit: Min guard       General bed mobility comments: Good return demonstration of log roll technique, mild physical assist.  Transfers Overall transfer level: Needs assistance Equipment used: Rolling walker (2 wheeled) Transfers: Sit to/from Stand Sit to Stand: Supervision         General transfer comment: Stand by for safety and minor cues.  Ambulation/Gait Ambulation/Gait assistance: Supervision Ambulation Distance (Feet): 120 Feet Assistive device: Rolling walker (2 wheeled) Gait Pattern/deviations: Step-through pattern;Antalgic Gait velocity: Mild decrease speed Gait velocity interpretation: Below normal speed for age/gender    Stairs Stairs: Yes Stairs assistance: Min assist Stair Management: One rail Left;Step to pattern Number of Stairs: 10 General  stair comments: Instruct in technique with person assist and side step PRN.  Wheelchair Mobility    Modified Rankin (Stroke Patients Only)       Balance Overall balance assessment: Needs assistance Sitting-balance support: No upper extremity supported Sitting balance-Leahy Scale: Good     Standing balance support: Single extremity supported Standing balance-Leahy Scale: Fair                               Pertinent Vitals/Pain Pain Assessment: 0-10 Pain Score: 7  Pain Location: Low back Pain Descriptors / Indicators: Aching;Sore Pain Intervention(s): Limited activity within patient's tolerance;Monitored during session;Patient requesting pain meds-RN notified    Home Living Family/patient expects to be discharged to:: Private residence Living Arrangements: Spouse/significant other Available Help at Discharge: Family;Available 24 hours/day Type of Home: House Home Access: Stairs to enter Entrance Stairs-Rails: Right Entrance Stairs-Number of Steps: 2 Home Layout: One level Home Equipment: Walker - 2 wheels;Walker - 4 wheels;Cane - single point      Prior Function Level of Independence: Independent with assistive device(s)         Comments: Uses cane or rolling walker at times.     Hand Dominance        Extremity/Trunk Assessment   Upper Extremity Assessment: Defer to OT evaluation;Overall WFL for tasks assessed           Lower Extremity Assessment: Overall WFL for tasks assessed;Generalized weakness         Communication   Communication: 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  Cognition Arousal/Alertness: Awake/alert Behavior During Therapy: WFL for tasks assessed/performed Overall Cognitive Status: Within Functional Limits for tasks assessed  General Comments      Exercises        Assessment/Plan    PT Assessment Patient needs continued PT services  PT Diagnosis Difficulty walking;Acute pain   PT Problem List  Decreased strength;Decreased activity tolerance;Decreased mobility;Pain  PT Treatment Interventions Gait training;Stair training;Functional mobility training;Therapeutic activities;Therapeutic exercise;Patient/family education   PT Goals (Current goals can be found in the Care Plan section) Acute Rehab PT Goals Patient Stated Goal: Get better and go home PT Goal Formulation: With patient Time For Goal Achievement: 06/13/15 Potential to Achieve Goals: Good    Frequency Min 5X/week   Barriers to discharge        Co-evaluation               End of Session Equipment Utilized During Treatment: Gait belt Activity Tolerance: Patient tolerated treatment well;No increased pain Patient left: in bed;with call bell/phone within reach Nurse Communication: Mobility status    Functional Assessment Tool Used: Clinical judgement Functional Limitation: Mobility: Walking and moving around Mobility: Walking and Moving Around Current Status [id number]): At least 20 percent but less than 40 percent impaired, limited or restricted Mobility: Walking and Moving Around Goal Status [phone]): At least 1 percent but less than 20 percent impaired, limited or restricted    Time: 1105-1135 PT Time Calculation (min) (ACUTE ONLY): 30 min   Charges:   PT Evaluation $PT Eval Low Complexity: 1 Procedure PT Treatments $Gait Training: 8-22 mins   PT G Codes:   PT G-Codes **NOT FOR INPATIENT CLASS** Functional Assessment Tool Used: Clinical judgement Functional Limitation: Mobility: Walking and moving around Mobility: Walking and Moving Around Current Status [id number]): At least 20 percent but less than 40 percent impaired, limited or restricted Mobility: Walking and Moving Around Goal Status [phone]): At least 1 percent but less than 20 percent impaired, limited or restricted    [name] [name], [name] [name] L 05/30/2015, 11:54 AM

## 2015-05-30 NOTE — Evaluation (Addendum)
Occupational Therapy Evaluation Patient Details Name: [name] [name] MRN: [id number] DOB: 1936-01-09 Today's Date: 05/30/2015    History of Present Illness 80 y.o. female post L2-3 microdiscectomy on 05/29/15. History of chronic back pain, CAD, GERD, osteoporosis, anxiety, and previous back surgery.   Clinical Impression   Pt s/p above. Education provided in session and she will have assist at home. OT signing off.    Follow Up Recommendations  No OT follow up;Supervision - Intermittent    Equipment Recommendations  None recommended by OT    Recommendations for Other Services       Precautions / Restrictions Precautions Precautions: Back Precaution comments: Educated on back precautions Precaution Booklet Issued:  (one in room) Restrictions Weight Bearing Restrictions: No      Mobility Bed Mobility   General bed mobility comments: not assessed  Transfers Overall transfer level: Needs assistance Transfers: Sit to/from Stand Sit to Stand: Supervision         General transfer comment: OT also retrieved RW for pt.     Balance Pt reaching for wall when asked to try single leg stance.                       ADL Overall ADL's : Needs assistance/impaired Eating/Feeding: Supervision/ safety (standing)                   Lower Body Dressing: Minimal assistance;Sit to/from stand   Toilet Transfer: Supervision/safety;Ambulation;RW;Set up (also walked without RW-supervision; OT retrieved RW for pt)           Functional mobility during ADLs: Supervision/safety;Rolling walker (also supervision without RW) General ADL Comments: Educated on AE and LB ADL technique. Discussed incorporating precautions into functional activities. Educated on safety such as rugs/items on floor, recommended someone be with her for shower transfer. Cues for precautions in session. Recommend someone be with her when she is up and moving. Recommended holding to steady door jam  when stepping into her shower.      Vision     Perception     Praxis      Pertinent Vitals/Pain Pain Assessment: 0-10 Pain Score:  (7-8) Pain Location: back Pain Descriptors / Indicators: Other (Comment) (surgical) Pain Intervention(s): Monitored during session     Hand Dominance Right   Extremity/Trunk Assessment Upper Extremity Assessment Upper Extremity Assessment: Overall WFL for tasks assessed   Lower Extremity Assessment Lower Extremity Assessment: Defer to PT evaluation       Communication Communication Communication: 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   Cognition Arousal/Alertness: Awake/alert Behavior During Therapy: WFL for tasks assessed/performed Overall Cognitive Status: Within Functional Limits for tasks assessed (cues given for precautions in session)                     General Comments       Exercises       Shoulder Instructions      Home Living Family/patient expects to be discharged to:: Private residence Living Arrangements: Spouse/significant other Available Help at Discharge: Family;Available 24 hours/day Type of Home: House Home Access: Stairs to enter [employer] of Steps: 2 Entrance Stairs-Rails: Right Home Layout: One level     Bathroom Shower/Tub: Walk-in shower         Home Equipment: Environmental consultant - 2 wheels;Walker - 4 wheels;Cane - single point;Bedside commode;Shower seat;Adaptive equipment Adaptive Equipment: Reacher;Long-handled sponge        Prior Functioning/Environment Level of Independence: Needs assistance    ADL's / [employer]  Needed: assist with cleaning   Comments: Uses cane or rolling walker at times.    OT Diagnosis: Acute pain   OT Problem List: Impaired balance (sitting and/or standing);Decreased knowledge of use of DME or AE;Pain;Decreased strength; Decreased knowledge of precautions (OT provided education in session to address problem list)   OT Treatment/Interventions:      OT  Goals(Current goals can be found in the care plan section) Acute Rehab OT Goals Patient Stated Goal: go home  OT Frequency:     Barriers to D/C:            Co-evaluation              End of Session Equipment Utilized During Treatment: Gait belt;Rolling walker  Activity Tolerance: Patient tolerated treatment well Patient left: in chair;with family/visitor present   Time: 1221-1239 OT Time Calculation (min): 18 min Charges:  OT General Charges $OT Visit: 1 Procedure OT Evaluation $OT Eval Moderate Complexity: 1 Procedure G-Codes: OT G-codes **NOT FOR INPATIENT CLASS** Functional Assessment Tool Used: clinical judgment Functional Limitation: Self care Self Care Current Status [id number]): At least 1 percent but less than 20 percent impaired, limited or restricted Self Care Goal Status [id number]): At least 1 percent but less than 20 percent impaired, limited or restricted Self Care Discharge Status [phone]): At least 1 percent but less than 20 percent impaired, limited or restricted  Benito Mccreedy OTR/L [id number] 05/30/2015, 1:17 PM

## 2015-05-30 NOTE — Care Management Note (Signed)
Case Management Note  Patient Details  Name: [name] [name] MRN: [id number] Date of Birth: 1935-10-19  Subjective/Objective:                  (LRB): RIGHT L2-3 MICRODISCECTOMY (N/A) Action/Plan: Discharge planning Expected Discharge Date:  05/31/15              Expected Discharge Plan:  [name]  In-House Referral:     Discharge planning Services  CM Consult  Post Acute Care Choice:  Home Health Choice offered to:  Patient  DME Arranged:  N/A DME Agency:     HH Arranged:  PT HH Agency:  [facility]  Status of Service:  Completed, signed off  Medicare Important Message Given:    Date Medicare IM Given:    Medicare IM give by:    Date Additional Medicare IM Given:    Additional Medicare Important Message give by:     If discussed at [place] of Stay Meetings, dates discussed:    Additional Comments: Cm met withj pt in room to offer choice of home health agency.  Pt chooses [facility] to render HHPT.  Referral called and accepted by Jacelyn Grip.  Pt states she has both a 3n1 and rolling walker at home.  No other CM needs were communicated. Dellie Catholic, RN 05/30/2015, 10:52 AM

## 2015-05-30 NOTE — Progress Notes (Signed)
Discharge orders received, Pt for discharge home today . IV d/c'd. D/c instructions and RX given with verbalized understanding. Family at bedside to assist patient with discharge. Staff bought pt downstairs via wheelchair. 1324 05/30/15

## 2015-05-30 NOTE — Progress Notes (Signed)
Patient ID: [name] [name], female   DOB: 05/01/1936, 80 y.o.   MRN: [id number] Postoperative day 1 status post decompression for herniated disc lumbar spine. Patient states she does not feel comfortable discharge to home today. Patient has been able to ambulate today. Plan for discharge to home tomorrow.

## 2015-05-30 NOTE — Progress Notes (Signed)
     Subjective: 1 Day Post-Op Procedure(s) (LRB): RIGHT L2-3 MICRODISCECTOMY (N/A) Awake, alert and oriented x 4. I want to go home. Patient reports pain as mild.    Objective:   VITALS:  Temp:  [97.9 F (36.6 C)-98.9 F (37.2 C)] 97.9 F (36.6 C) (01/21 0950) Pulse Rate:  [68-115] 68 (01/21 0950) Resp:  [11-27] 20 (01/21 0950) BP: (120-155)/(53-87) 139/71 mmHg (01/21 0950) SpO2:  [95 %-100 %] 95 % (01/21 0950)  Neurologically intact ABD soft Neurovascular intact Sensation intact distally Dorsiflexion/Plantar flexion intact Incision: dressing C/D/I   LABS No results for input(s): HGB, WBC, PLT in the last 72 hours.  Recent Labs  05/30/15 0522  NA 140  K 4.1  CL 105  CO2 24  BUN 6  CREATININE 0.70  GLUCOSE 104*   No results for input(s): LABPT, INR in the last 72 hours.   Assessment/Plan: 1 Day Post-Op Procedure(s) (LRB): RIGHT L2-3 MICRODISCECTOMY (N/A)  Advance diet Up with therapy D/C IV fluids Discharge home with home health  Vitaliy Eisenhour E 05/30/2015, 10:59 AM

## 2015-06-01 ENCOUNTER — Encounter (HOSPITAL_COMMUNITY): Payer: Self-pay | Admitting: Specialist

## 2015-06-01 LAB — HEMOGLOBIN A1C
Hgb A1c MFr Bld: 6 % — ABNORMAL HIGH (ref 4.8–5.6)
MEAN PLASMA GLUCOSE: 126 mg/dL

## 2015-06-01 MED FILL — Thrombin For Soln 20000 Unit: CUTANEOUS | Qty: 1 | Status: AC

## 2015-06-04 ENCOUNTER — Encounter: Payer: Self-pay | Admitting: Internal Medicine

## 2015-06-11 DIAGNOSIS — M25551 Pain in right hip: Secondary | ICD-10-CM | POA: Diagnosis not present

## 2015-06-25 DIAGNOSIS — M7061 Trochanteric bursitis, right hip: Secondary | ICD-10-CM | POA: Diagnosis not present

## 2015-06-29 DIAGNOSIS — L570 Actinic keratosis: Secondary | ICD-10-CM | POA: Diagnosis not present

## 2015-06-29 DIAGNOSIS — L218 Other seborrheic dermatitis: Secondary | ICD-10-CM | POA: Diagnosis not present

## 2015-07-06 ENCOUNTER — Other Ambulatory Visit: Payer: Self-pay | Admitting: Internal Medicine

## 2015-07-07 ENCOUNTER — Encounter: Payer: Self-pay | Admitting: Internal Medicine

## 2015-07-07 ENCOUNTER — Ambulatory Visit (INDEPENDENT_AMBULATORY_CARE_PROVIDER_SITE_OTHER): Payer: PPO | Admitting: Internal Medicine

## 2015-07-07 VITALS — BP 138/82 | HR 84 | Temp 97.6°F | Resp 16 | Ht 63.0 in | Wt 144.8 lb

## 2015-07-07 DIAGNOSIS — R7303 Prediabetes: Secondary | ICD-10-CM | POA: Diagnosis not present

## 2015-07-07 DIAGNOSIS — R450 Nervousness: Secondary | ICD-10-CM

## 2015-07-07 DIAGNOSIS — Z1212 Encounter for screening for malignant neoplasm of rectum: Secondary | ICD-10-CM

## 2015-07-07 DIAGNOSIS — Z79899 Other long term (current) drug therapy: Secondary | ICD-10-CM

## 2015-07-07 DIAGNOSIS — G894 Chronic pain syndrome: Secondary | ICD-10-CM

## 2015-07-07 DIAGNOSIS — E782 Mixed hyperlipidemia: Secondary | ICD-10-CM | POA: Diagnosis not present

## 2015-07-07 DIAGNOSIS — R6889 Other general symptoms and signs: Secondary | ICD-10-CM

## 2015-07-07 DIAGNOSIS — M5136 Other intervertebral disc degeneration, lumbar region: Secondary | ICD-10-CM | POA: Diagnosis not present

## 2015-07-07 DIAGNOSIS — R7309 Other abnormal glucose: Secondary | ICD-10-CM | POA: Diagnosis not present

## 2015-07-07 DIAGNOSIS — I10 Essential (primary) hypertension: Secondary | ICD-10-CM

## 2015-07-07 DIAGNOSIS — E559 Vitamin D deficiency, unspecified: Secondary | ICD-10-CM | POA: Diagnosis not present

## 2015-07-07 DIAGNOSIS — Z0001 Encounter for general adult medical examination with abnormal findings: Secondary | ICD-10-CM | POA: Diagnosis not present

## 2015-07-07 DIAGNOSIS — IMO0001 Reserved for inherently not codable concepts without codable children: Secondary | ICD-10-CM

## 2015-07-07 DIAGNOSIS — K219 Gastro-esophageal reflux disease without esophagitis: Secondary | ICD-10-CM | POA: Diagnosis not present

## 2015-07-07 LAB — URINALYSIS, ROUTINE W REFLEX MICROSCOPIC
BILIRUBIN URINE: NEGATIVE
GLUCOSE, UA: NEGATIVE
HGB URINE DIPSTICK: NEGATIVE
KETONES UR: NEGATIVE
Leukocytes, UA: NEGATIVE
Nitrite: NEGATIVE
PH: 7.5 (ref 5.0–8.0)
PROTEIN: NEGATIVE
Specific Gravity, Urine: 1.012 (ref 1.001–1.035)

## 2015-07-07 LAB — CBC WITH DIFFERENTIAL/PLATELET
Basophils Absolute: 0 10*3/uL (ref 0.0–0.1)
Basophils Relative: 0 % (ref 0–1)
EOS ABS: 0 10*3/uL (ref 0.0–0.7)
EOS PCT: 0 % (ref 0–5)
HCT: 45.1 % (ref 36.0–46.0)
Hemoglobin: 14.7 g/dL (ref 12.0–15.0)
LYMPHS ABS: 1.8 10*3/uL (ref 0.7–4.0)
Lymphocytes Relative: 25 % (ref 12–46)
MCH: 31.3 pg (ref 26.0–34.0)
MCHC: 32.6 g/dL (ref 30.0–36.0)
MCV: 96.2 fL (ref 78.0–100.0)
MONO ABS: 0.9 10*3/uL (ref 0.1–1.0)
MONOS PCT: 12 % (ref 3–12)
MPV: 10.4 fL (ref 8.6–12.4)
Neutro Abs: 4.5 10*3/uL (ref 1.7–7.7)
Neutrophils Relative %: 63 % (ref 43–77)
PLATELETS: 380 10*3/uL (ref 150–400)
RBC: 4.69 MIL/uL (ref 3.87–5.11)
RDW: 13.5 % (ref 11.5–15.5)
WBC: 7.1 10*3/uL (ref 4.0–10.5)

## 2015-07-07 LAB — BASIC METABOLIC PANEL WITH GFR
BUN: 21 mg/dL (ref 7–25)
CALCIUM: 9.8 mg/dL (ref 8.6–10.4)
CHLORIDE: 103 mmol/L (ref 98–110)
CO2: 27 mmol/L (ref 20–31)
CREATININE: 0.98 mg/dL — AB (ref 0.60–0.93)
GFR, Est African American: 64 mL/min (ref >=60)
GFR, Est Non African American: 56 mL/min — ABNORMAL LOW (ref >=60)
Glucose, Bld: 91 mg/dL (ref 65–99)
Potassium: 4.8 mmol/L (ref 3.5–5.3)
Sodium: 140 mmol/L (ref 135–146)

## 2015-07-07 LAB — HEPATIC FUNCTION PANEL
ALBUMIN: 4.2 g/dL (ref 3.6–5.1)
ALT: 21 U/L (ref 6–29)
AST: 17 U/L (ref 10–35)
Alkaline Phosphatase: 61 U/L (ref 33–130)
BILIRUBIN TOTAL: 0.6 mg/dL (ref 0.2–1.2)
Bilirubin, Direct: 0.1 mg/dL (ref <=0.2)
Indirect Bilirubin: 0.5 mg/dL (ref 0.2–1.2)
Total Protein: 6.9 g/dL (ref 6.1–8.1)

## 2015-07-07 LAB — TSH: TSH: 0.84 m[IU]/L

## 2015-07-07 LAB — HEMOGLOBIN A1C
HEMOGLOBIN A1C: 5.7 % — AB (ref <5.7)
Mean Plasma Glucose: 117 mg/dL — ABNORMAL HIGH (ref <117)

## 2015-07-07 LAB — LIPID PANEL
CHOLESTEROL: 178 mg/dL (ref 125–200)
HDL: 83 mg/dL (ref >=46)
LDL Cholesterol: 74 mg/dL (ref <130)
Total CHOL/HDL Ratio: 2.1 Ratio (ref <=5.0)
Triglycerides: 107 mg/dL (ref <150)
VLDL: 21 mg/dL (ref <30)

## 2015-07-07 LAB — MAGNESIUM: MAGNESIUM: 2.2 mg/dL (ref 1.5–2.5)

## 2015-07-07 MED ORDER — ALPRAZOLAM 0.5 MG PO TABS: ORAL_TABLET | ORAL | Status: DC

## 2015-07-07 NOTE — Patient Instructions (Signed)
Recommend Adult Low Dose Aspirin or   coated  Aspirin 81 mg daily   To reduce risk of Colon Cancer 20 %,   Skin Cancer 26 % ,   Melanoma 46%   and   Pancreatic cancer 60%   ++++++++++++++++++++++++++++++++++++++++++++++++++++++ Vitamin D goal   is between 70-100.   Please make sure that you are taking your Vitamin D as directed.   It is very important as a natural anti-inflammatory   helping hair, skin, and nails, as well as reducing stroke and heart attack risk.   It helps your bones and helps with mood.  It also decreases numerous cancer risks so please take it as directed.   Low Vit D is associated with a 200-300% higher risk for CANCER   and 200-300% higher risk for HEART   ATTACK  &  STROKE.   .....................................[name]  It is also associated with higher death rate at younger ages,   autoimmune diseases like Rheumatoid arthritis, Lupus, Multiple Sclerosis.     Also many other serious conditions, like depression, Alzheimer's  Dementia, infertility, muscle aches, fatigue, fibromyalgia - just to name a few.  ++++++++++++++++++++++++++++++++++++++++++++++++  Recommend the book "The END of DIETING" by Dr Excell Seltzer   & the book "The END of DIABETES " by Dr Excell Seltzer  At [email or web address] - get book & Audio CD's     Being diabetic has a  300% increased risk for heart attack, stroke, cancer, and alzheimer- type vascular dementia. It is very important that you work harder with diet by avoiding all foods that are white. Avoid white rice (brown & wild rice is OK), white potatoes (sweetpotatoes in moderation is OK), White bread or wheat bread or anything made out of white flour like bagels, donuts, rolls, buns, biscuits, cakes, pastries, cookies, pizza crust, and pasta (made from white flour & egg whites) - vegetarian pasta or spinach or wheat pasta is OK. Multigrain breads like Arnold's or Pepperidge [employer], or multigrain sandwich thins or flatbreads.  Diet,  exercise and weight loss can reverse and cure diabetes in the early stages.  Diet, exercise and weight loss is very important in the control and prevention of complications of diabetes which affects every system in your body, ie. Brain - dementia/stroke, eyes - glaucoma/blindness, heart - heart attack/heart failure, kidneys - dialysis, stomach - gastric paralysis, intestines - malabsorption, nerves - severe painful neuritis, circulation - gangrene & loss of a leg(s), and finally cancer and Alzheimers.    I recommend avoid fried & greasy foods,  sweets/candy, white rice (brown or wild rice or Quinoa is OK), white potatoes (sweet potatoes are OK) - anything made from white flour - bagels, doughnuts, rolls, buns, biscuits,white and wheat breads, pizza crust and traditional pasta made of white flour & egg white(vegetarian pasta or spinach or wheat pasta is OK).  Multi-grain bread is OK - like multi-grain flat bread or sandwich thins. Avoid alcohol in excess. Exercise is also important.    Eat all the vegetables you want - avoid meat, especially red meat and dairy - especially cheese.  Cheese is the most concentrated form of trans-fats which is the worst thing to clog up our arteries. Veggie cheese is OK which can be found in the fresh produce section at [employer] or [employer] or [employer]  ++++++++++++++++++++++++++++++++++++++++++++++++++ DASH Eating Plan  DASH stands for "Dietary Approaches to Stop Hypertension."   The DASH eating plan is a healthy eating plan that has been shown to reduce high blood  pressure (hypertension). Additional health benefits may include reducing the risk of type 2 diabetes mellitus, heart disease, and stroke. The DASH eating plan may also help with weight loss.  WHAT DO I NEED TO KNOW ABOUT THE DASH EATING PLAN?  For the DASH eating plan, you will follow these general guidelines:  Choose foods with a percent daily value for sodium of less than 5% (as listed on the food  label).  Use salt-free seasonings or herbs instead of table salt or sea salt.  Check with your health care provider or pharmacist before using salt substitutes.  Eat lower-sodium products, often labeled as "lower sodium" or "no salt added."  Eat fresh foods.  Eat more vegetables, fruits, and low-fat dairy products.    Choose whole grains. Look for the word "whole" as the first word in the ingredient list.  Choose fish   Limit sweets, desserts, sugars, and sugary drinks.  Choose heart-healthy fats.  Eat veggie cheese   Eat more home-cooked food and less restaurant, buffet, and fast food.  Limit fried foods.  [place]uffaker foods using methods other than frying.  Limit canned vegetables. If you do use them, rinse them well to decrease the sodium.  When eating at a restaurant, ask that your food be prepared with less salt, or no salt if possible.                      WHAT FOODS CAN I EAT?  Read Dr Fara Olden Fuhrman's books on The End of Dieting & The End of Diabetes  Grains  Whole grain or whole wheat bread. Brown rice. Whole grain or whole wheat pasta. Quinoa, bulgur, and whole grain cereals. Low-sodium cereals. Corn or whole wheat flour tortillas. Whole grain cornbread. Whole grain crackers. Low-sodium crackers.  Vegetables  Fresh or frozen vegetables (raw, steamed, roasted, or grilled). Low-sodium or reduced-sodium tomato and vegetable juices. Low-sodium or reduced-sodium tomato sauce and paste. Low-sodium or reduced-sodium canned vegetables.   Fruits  All fresh, canned (in natural juice), or frozen fruits.  Protein Products   All fish and seafood.  Dried beans, peas, or lentils. Unsalted nuts and seeds. Unsalted canned beans.  Dairy  Low-fat dairy products, such as skim or 1% milk, 2% or reduced-fat cheeses, low-fat ricotta or cottage cheese, or plain low-fat yogurt. Low-sodium or reduced-sodium cheeses.  Fats and Oils  Tub margarines without trans fats. Light or  reduced-fat mayonnaise and salad dressings (reduced sodium). Avocado. Safflower, olive, or canola oils. Natural peanut or almond butter.  Other  Unsalted popcorn and pretzels. The items listed above may not be a complete list of recommended foods or beverages. Contact your dietitian for more options.  +++++++++++++++++++++++++++++++++++++++++++  WHAT FOODS ARE NOT RECOMMENDED?  Grains/ White flour or wheat flour  White bread. White pasta. White rice. Refined cornbread. Bagels and croissants. Crackers that contain trans fat.  Vegetables  Creamed or fried vegetables. Vegetables in a . Regular canned vegetables. Regular canned tomato sauce and paste. Regular tomato and vegetable juices.  Fruits  Dried fruits. Canned fruit in light or heavy syrup. Fruit juice.  Meat and Other Protein Products  Meat in general - RED mwaet & White meat.  Fatty cuts of meat. Ribs, chicken wings, bacon, sausage, bologna, salami, chitterlings, fatback, hot dogs, bratwurst, and packaged luncheon meats.  Dairy  Whole or 2% milk, cream, half-and-half, and cream cheese. Whole-fat or sweetened yogurt. Full-fat cheeses or blue cheese. Nondairy creamers and whipped toppings. Processed cheese, cheese spreads, or  cheese curds.  Condiments  Onion and garlic salt, seasoned salt, table salt, and sea salt. Canned and packaged gravies. Worcestershire sauce. Tartar sauce. Barbecue sauce. Teriyaki sauce. Soy sauce, including reduced sodium. Steak sauce. Fish sauce. Oyster sauce. Cocktail sauce. Horseradish. Ketchup and mustard. Meat flavorings and tenderizers. Bouillon cubes. Hot sauce. Tabasco sauce. Marinades. Taco seasonings. Relishes.  Fats and Oils Butter, stick margarine, lard, shortening and bacon fat. Coconut, palm kernel, or palm oils. Regular salad dressings.  Pickles and olives. Salted popcorn and pretzels.  The items listed above may not be a complete list of foods and beverages to avoid.   Preventive  Care for Adults  A healthy lifestyle and preventive care can promote health and wellness. Preventive health guidelines for women include the following key practices.  A routine yearly physical is a good way to check with your health care provider about your health and preventive screening. It is a chance to share any concerns and updates on your health and to receive a thorough exam.  Visit your dentist for a routine exam and preventive care every 6 months. Brush your teeth twice a day and floss once a day. Good oral hygiene prevents tooth decay and gum disease.  The frequency of eye exams is based on your age, health, family medical history, use of contact lenses, and other factors. Follow your health care provider's recommendations for frequency of eye exams.  Eat a healthy diet. Foods like vegetables, fruits, whole grains, low-fat dairy products, and lean protein foods contain the nutrients you need without too many calories. Decrease your intake of foods high in solid fats, added sugars, and salt. Eat the right amount of calories for you.Get information about a proper diet from your health care provider, if necessary.  Regular physical exercise is one of the most important things you can do for your health. Most adults should get at least 150 minutes of moderate-intensity exercise (any activity that increases your heart rate and causes you to sweat) each week. In addition, most adults need muscle-strengthening exercises on 2 or more days a week.  Maintain a healthy weight. The body mass index (BMI) is a screening tool to identify possible weight problems. It provides an estimate of body fat based on height and weight. Your health care provider can find your BMI and can help you achieve or maintain a healthy weight.For adults 20 years and older:  A BMI below 18.5 is considered underweight.  A BMI of 18.5 to 24.9 is normal.  A BMI of 25 to 29.9 is considered overweight.  A BMI of 30 and  above is considered obese.  Maintain normal blood lipids and cholesterol levels by exercising and minimizing your intake of saturated fat. Eat a balanced diet with plenty of fruit and vegetables. If your lipid or cholesterol levels are high, you are over 50, or you are at high risk for heart disease, you may need your cholesterol levels checked more frequently.Ongoing high lipid and cholesterol levels should be treated with medicines if diet and exercise are not working.  If you smoke, find out from your health care provider how to quit. If you do not use tobacco, do not start.  Lung cancer screening is recommended for adults aged 56-80 years who are at high risk for developing lung cancer because of a history of smoking. A yearly low-dose CT scan of the lungs is recommended for people who have at least a 30-pack-year history of smoking and are a current smoker or  have quit within the past 15 years. A pack year of smoking is smoking an average of 1 pack of cigarettes a day for 1 year (for example: 1 pack a day for 30 years or 2 packs a day for 15 years). Yearly screening should continue until the smoker has stopped smoking for at least 15 years. Yearly screening should be stopped for people who develop a health problem that would prevent them from having lung cancer treatment.  Avoid use of street drugs. Do not share needles with anyone. Ask for help if you need support or instructions about stopping the use of drugs.  High blood pressure causes heart disease and increases the risk of stroke.  Ongoing high blood pressure should be treated with medicines if weight loss and exercise do not work.  If you are 52-67 years old, ask your health care provider if you should take aspirin to prevent strokes.  Diabetes screening involves taking a blood sample to check your fasting blood sugar level. This should be done once every 3 years, after age 9, if you are within normal weight and without risk factors for  diabetes. Testing should be considered at a younger age or be carried out more frequently if you are overweight and have at least 1 risk factor for diabetes.  Breast cancer screening is essential preventive care for women. You should practice "breast self-awareness." This means understanding the normal appearance and feel of your breasts and may include breast self-examination. Any changes detected, no matter how small, should be reported to a health care provider. Women in their 45s and 30s should have a clinical breast exam (CBE) by a health care provider as part of a regular health exam every 1 to 3 years. After age 76, women should have a CBE every year. Starting at age 54, women should consider having a mammogram (breast X-ray test) every year. Women who have a family history of breast cancer should talk to their health care provider about genetic screening. Women at a high risk of breast cancer should talk to their health care providers about having an MRI and a mammogram every year.  Breast cancer gene (BRCA)-related cancer risk assessment is recommended for women who have family members with BRCA-related cancers. BRCA-related cancers include breast, ovarian, tubal, and peritoneal cancers. Having family members with these cancers may be associated with an increased risk for harmful changes (mutations) in the breast cancer genes BRCA1 and BRCA2. Results of the assessment will determine the need for genetic counseling and BRCA1 and BRCA2 testing.  Routine pelvic exams to screen for cancer are no longer recommended for nonpregnant women who are considered low risk for cancer of the pelvic organs (ovaries, uterus, and vagina) and who do not have symptoms. Ask your health care provider if a screening pelvic exam is right for you.  If you have had past treatment for cervical cancer or a condition that could lead to cancer, you need Pap tests and screening for cancer for at least 20 years after your  treatment. If Pap tests have been discontinued, your risk factors (such as having a new sexual partner) need to be reassessed to determine if screening should be resumed. Some women have medical problems that increase the chance of getting cervical cancer. In these cases, your health care provider may recommend more frequent screening and Pap tests.    Colorectal cancer can be detected and often prevented. Most routine colorectal cancer screening begins at the age of 73 years and  continues through age 75 years. However, your health care provider may recommend screening at an earlier age if you have risk factors for colon cancer. On a yearly basis, your health care provider may provide home test kits to check for hidden blood in the stool. Use of a small camera at the end of a tube, to directly examine the colon (sigmoidoscopy or colonoscopy), can detect the earliest forms of colorectal cancer. Talk to your health care provider about this at age 50, when routine screening begins. Direct exam of the colon should be repeated every 5-10 years through age 75 years, unless early forms of pre-cancerous polyps or small growths are found.  Osteoporosis is a disease in which the bones lose minerals and strength with aging. This can result in serious bone fractures or breaks. The risk of osteoporosis can be identified using a bone density scan. Women ages 65 years and over and women at risk for fractures or osteoporosis should discuss screening with their health care providers. Ask your health care provider whether you should take a calcium supplement or vitamin D to reduce the rate of osteoporosis.  Menopause can be associated with physical symptoms and risks. Hormone replacement therapy is available to decrease symptoms and risks. You should talk to your health care provider about whether hormone replacement therapy is right for you.  Use sunscreen. Apply sunscreen liberally and repeatedly throughout the day. You  should seek shade when your shadow is shorter than you. Protect yourself by wearing long sleeves, pants, a wide-brimmed hat, and sunglasses year round, whenever you are outdoors.  Once a month, do a whole body skin exam, using a mirror to look at the skin on your back. Tell your health care provider of new moles, moles that have irregular borders, moles that are larger than a pencil eraser, or moles that have changed in shape or color.  Stay current with required vaccines (immunizations).  Influenza vaccine. All adults should be immunized every year.  Tetanus, diphtheria, and acellular pertussis (Td, Tdap) vaccine. Pregnant women should receive 1 dose of Tdap vaccine during each pregnancy. The dose should be obtained regardless of the length of time since the last dose. Immunization is preferred during the 27th-36th week of gestation. An adult who has not previously received Tdap or who does not know her vaccine status should receive 1 dose of Tdap. This initial dose should be followed by tetanus and diphtheria toxoids (Td) booster doses every 10 years. Adults with an unknown or incomplete history of completing a 3-dose immunization series with Td-containing vaccines should begin or complete a primary immunization series including a Tdap dose. Adults should receive a Td booster every 10 years.    Zoster vaccine. One dose is recommended for adults aged 60 years or older unless certain conditions are present.    Pneumococcal 13-valent conjugate (PCV13) vaccine. When indicated, a person who is uncertain of her immunization history and has no record of immunization should receive the PCV13 vaccine. An adult aged 19 years or older who has certain medical conditions and has not been previously immunized should receive 1 dose of PCV13 vaccine. This PCV13 should be followed with a dose of pneumococcal polysaccharide (PPSV23) vaccine. The PPSV23 vaccine dose should be obtained at least 8 weeks after the dose  of PCV13 vaccine. An adult aged 19 years or older who has certain medical conditions and previously received 1 or more doses of PPSV23 vaccine should receive 1 dose of PCV13. The PCV13 vaccine dose should   be obtained 1 or more years after the last PPSV23 vaccine dose.    Pneumococcal polysaccharide (PPSV23) vaccine. When PCV13 is also indicated, PCV13 should be obtained first. All adults aged 65 years and older should be immunized. An adult younger than age 65 years who has certain medical conditions should be immunized. Any person who resides in a nursing home or long-term care facility should be immunized. An adult smoker should be immunized. People with an immunocompromised condition and certain other conditions should receive both PCV13 and PPSV23 vaccines. People with human immunodeficiency virus (HIV) infection should be immunized as soon as possible after diagnosis. Immunization during chemotherapy or radiation therapy should be avoided. Routine use of PPSV23 vaccine is not recommended for American Indians, [place] Natives, or people younger than 65 years unless there are medical conditions that require PPSV23 vaccine. When indicated, people who have unknown immunization and have no record of immunization should receive PPSV23 vaccine. One-time revaccination 5 years after the first dose of PPSV23 is recommended for people aged 19-64 years who have chronic kidney failure, nephrotic syndrome, asplenia, or immunocompromised conditions. People who received 1-2 doses of PPSV23 before age 65 years should receive another dose of PPSV23 vaccine at age 65 years or later if at least 5 years have passed since the previous dose. Doses of PPSV23 are not needed for people immunized with PPSV23 at or after age 65 years.   Preventive Services / Frequency  Ages 65 years and over  Blood pressure check.  Lipid and cholesterol check.  Lung cancer screening. / Every year if you are aged 55-80 years and have a  30-pack-year history of smoking and currently smoke or have quit within the past 15 years. Yearly screening is stopped once you have quit smoking for at least 15 years or develop a health problem that would prevent you from having lung cancer treatment.  Clinical breast exam.** / Every year after age 40 years.  BRCA-related cancer risk assessment.** / For women who have family members with a BRCA-related cancer (breast, ovarian, tubal, or peritoneal cancers).  Mammogram.** / Every year beginning at age 40 years and continuing for as long as you are in good health. Consult with your health care provider.  Pap test.** / Every 3 years starting at age 30 years through age 65 or 70 years with 3 consecutive normal Pap tests. Testing can be stopped between 65 and 70 years with 3 consecutive normal Pap tests and no abnormal Pap or HPV tests in the past 10 years.  Fecal occult blood test (FOBT) of stool. / Every year beginning at age 50 years and continuing until age 75 years. You may not need to do this test if you get a colonoscopy every 10 years.  Flexible sigmoidoscopy or colonoscopy.** / Every 5 years for a flexible sigmoidoscopy or every 10 years for a colonoscopy beginning at age 50 years and continuing until age 75 years.  Hepatitis C blood test.** / For all people born from 1945 through 1965 and any individual with known risks for hepatitis C.  Osteoporosis screening.** / A one-time screening for women ages 65 years and over and women at risk for fractures or osteoporosis.  Skin self-exam. / Monthly.  Influenza vaccine. / Every year.  Tetanus, diphtheria, and acellular pertussis (Tdap/Td) vaccine.** / 1 dose of Td every 10 years.  Zoster vaccine.** / 1 dose for adults aged 60 years or older.  Pneumococcal 13-valent conjugate (PCV13) vaccine.** / Consult your health care provider.    Pneumococcal polysaccharide (PPSV23) vaccine.** / 1 dose for all adults aged 65 years and older. Screening  for abdominal aortic aneurysm (AAA)  by ultrasound is recommended for people who have history of high blood pressure or who are current or former smokers. 

## 2015-07-07 NOTE — Progress Notes (Signed)
Patient ID: [name] [name], female   DOB: [birth year], 80 y.o.   MRN: [id number]  Annual Screening/Preventative Visit And Comprehensive Evaluation &  Examination      This very nice 80 y.o. MWF presents for a Wellness/Preventative Visit & comprehensive evaluation and management of multiple medical co-morbidities.  Patient has been followed for HTN, ASCAD, Prediabetes, Hyperlipidemia and Vitamin D Deficiency. She is also followed by Dr Julien Nordmann for MGUS.  In addition she has been seen in the past by Dr's Oilton for her COPD & Asthma.       In Jan 2013 she had laparoscopic Nissen Fundoplication and in Dec [id number] , she had repair of a large para-esophageal hiatial hernia.  Other problems include Chronic Pain Syndrome with hx/o Lumbar Laminectomy in 1989. Then she did reasonably well until 2014 when she underwent L4/L5 microdiscectomy and in 2016 had kyphoplasty for thoracic compression fx's attributed to from Osteoporosis. More recently she had R L2/L3 Lumbar microdiscectomy by Dr Louanne Skye . She has had several EDSI' s by Dr Mina Marble & Dr Ernestina Patches. She had been followed in several pain clinics in the past and most recently at [facility] ,but as her new [employer] won't cover, she will be receiving all of her pain meds thru Dr Otho Ket office.        HTN predates since 31. Patient's BP has been controlled at home and patient denies any cardiac symptoms as chest pain, palpitations, shortness of breath, dizziness or ankle swelling. Today's BP: 138/82 mmHg. In 2003 she had PTCA & Stents x 2 and in 2914 she has a negative lexiscan myoview.       Patient's hyperlipidemia is controlled with diet and medications. Patient denies myalgias or other medication SE's. Last lipids were at goal with Cholesterol 142; HDL 72; LDL 56; Triglycerides 69 on 02/27/2015.      Patient has prediabetes predating since 2010 with A1c 5.8% and patient denies reactive hypoglycemic symptoms, visual blurring, diabetic polys, or  paresthesias. Last A1c was 6.0% on 05/29/2015.      Finally, patient has history of Vitamin D Deficiency of "25" in 2008 and last Vitamin D was 56 on 02/27/2015.  Medication Sig  . albuterol (PROVENTIL HFA;VENTOLIN HFA) 108 (90 BASE) MCG/ACT inhaler Inhale 1-2 puffs into the lungs every 6 (six) hours as needed for wheezing or shortness of breath.  [name] alendronate (FOSAMAX) 70 MG tablet Take 70 mg by mouth once a week. Take with a full glass of water on an empty stomach. Take on Mondays  . amLODipine (NORVASC) 10 MG tablet Take 1 tablet (10 mg total) by mouth at bedtime.  . Cholecalciferol (VITAMIN D3) 2000 UNITS TABS Take 6,000 Units by mouth daily.   [name] docusate sodium (COLACE) 250 MG capsule Take 1 capsule (250 mg total) by mouth 2 (two) times daily.  . feeding supplement, GLUCERNA SHAKE, (GLUCERNA SHAKE) LIQD Take 237 mLs by mouth 3 (three) times daily between meals.  [name] ipratropium-albuterol (DUONEB) 0.5-2.5 (3) MG/3ML SOLN USE ONE VIAL IN NEBULIZER 4 TIMES DAILY (Patient taking differently: Inhale 3 mLs into the lungs every 6 (six) hours as needed. For shortness of breath)  . losartan (COZAAR) 100 MG tablet TAKE ONE TABLET BY MOUTH ONCE DAILY (DISCONTINUE  BENICAR)  . metFORMIN (GLUCOPHAGE-XR) 500 MG 24 hr tablet TAKE ONE TO TWO TABLETS BY MOUTH TWICE DAILY WITH MEALS FOR DIABETES  . metoprolol (LOPRESSOR) 50 MG tablet TAKE ONE TABLET BY MOUTH TWICE DAILY FOR BLOOD PRESSURE (  Patient taking differently: take 0.5 tablet by mouth at bedtime)  . mometasone-formoterol (DULERA) 200-5 MCG/ACT AERO Inhale 2 puffs into the lungs 2 (two) times daily. (Patient taking differently: Inhale 2 puffs into the lungs 2 (two) times daily as needed for wheezing. )  . nitroGLYCERIN (NITROSTAT) 0.4 MG SL tablet Place 1 tablet (0.4 mg total) under the tongue every 5 (five) minutes as needed for chest pain.  [name] ondansetron (ZOFRAN ODT) 4 MG disintegrating tablet Take 1 tablet (4 mg total) by mouth every 8 (eight) hours as  needed for nausea or vomiting.  . orphenadrine (NORFLEX) 100 MG tablet Take 1 tablet (100 mg total) by mouth 2 (two) times daily as needed for muscle spasms.  [name] OVER THE COUNTER MEDICATION Take 1,000 mg by mouth 2 (two) times daily. [place]al Calcium 1000mg   . OVER THE COUNTER MEDICATION Take 400 mg by mouth 2 (two) times daily. Magnesium Carbonate 400mg   . oxyCODONE (OXY IR/ROXICODONE) 5 MG immediate release tablet Take 5 mg by mouth every 6 (six) hours as needed for moderate pain or severe pain.  [name] oxyCODONE-acetaminophen (PERCOCET/ROXICET) 5-325 MG tablet Take 1-2 tablets by mouth every 4 (four) hours as needed for moderate pain.  . pantoprazole (PROTONIX) 40 MG tablet Take 1 tablet (40 mg total) by mouth daily.  . polyethylene glycol (MIRALAX / GLYCOLAX) packet Take 17 g by mouth 2 (two) times daily.  . potassium chloride SA (K-DUR,KLOR-CON) 20 MEQ tablet Take 1 tablet (20 mEq total) by mouth 2 (two) times daily.  . pravastatin (PRAVACHOL) 40 MG tablet TAKE ONE TABLET BY MOUTH ONCE DAILY  . prochlorperazine (COMPAZINE) 5 MG tablet Take 1 capsule 3 x day before meals only if needed for Nausea  . senna (SENOKOT) 8.6 MG TABS tablet Take 1 tablet by mouth 2 (two) times daily.   . silodosin (RAPAFLO) 8 MG CAPS capsule Take 8 mg by mouth daily with breakfast.  . sucralfate (CARAFATE) 1 G tablet TAKE ONE TABLET BY MOUTH 4 TIMES DAILY BEFORE MEALS AND AT BEDTIME FOR HEARTBURN   Allergies  Allergen Reactions  . Aspartame Diarrhea and Nausea And Vomiting  . Daliresp [Roflumilast] Nausea And Vomiting  . Levofloxacin Nausea And Vomiting  . Shellfish Allergy Other (See Comments)    Flushing  . Atorvastatin Other (See Comments)    Muscle aches  . Ace Inhibitors Cough    Per Dr Melvyn Novas pulmonology 2013  . Biaxin [Clarithromycin] Other (See Comments)    PATIENT CAN TOLERATE Z-PAK.  Is written on patient's paper chart.  . Erythromycin Other (See Comments)    PATIENT CAN TOLERATE Z-PAK.  It is written on  patient's paper chart.  . Hydrocodone Nausea Only   Past Medical History  Diagnosis Date  . CAD (coronary artery disease)     LHC 2003 with 40% pLAD, 30% mLAD, 100% D1 (moderate vessel), 100%  D2 (small vessel), 95% mid small OM2.  Pt had PTCA to D1;  D2 and OM2 small vessels and tx medically;  Last Myoview (2012): anterior infarct seen with scar, no ischemia. EF normal. Pt managed medically;  Lexiscan Myoview (12/14):  Low risk; ant scar with very mild peri-infarct ischemia; EF 55% with ant AK   . Hyperlipidemia     takes Pravastatin daily  . TIA (transient ischemic attack)     no residual effects noted  . DIVERTICULOSIS, COLON 08/28/2008    Qualifier: Diagnosis of  By: [name], [name], [name]    . Chronic nausea  takes Zofran daily as needed  . Chronic back pain   . PONV (postoperative nausea and vomiting)   . CAD (coronary artery disease)   . Osteoporosis   . Anxiety     takes Xanax daily as needed  . Asthma     Albuterol daily as needed  . Constipation     takes Senokot and Miralax daily  . Hypertension     takes Amlodipine,Metoprolol,and Losartan daily  . History of bronchitis several of yrs ago  . History of vertigo     no meds  . Chronic back pain     HNP  . Osteopenia   . Hiatal hernia     hx of  . GERD (gastroesophageal reflux disease)     hx of  . History of colon polyps     benign  . Slow urinary stream     takes Rapaflo daily   Health Maintenance  Topic Date Due  . ZOSTAVAX  10/19/2017 (Originally 11/01/1997)  . INFLUENZA VACCINE  12/08/2015  . COLONOSCOPY  04/06/2016  . TETANUS/TDAP  04/24/2024  . DEXA SCAN  Completed  . PNA vac Low Risk Adult  Completed   Immunization History  Administered Date(s) Administered  . DT 05/06/2014  . Influenza Split 02/07/2011  . Influenza Whole 02/09/2009, 01/19/2010, 02/13/2012  . Influenza, High Dose Seasonal PF 03/22/2013, 01/15/2014, 02/27/2015  . Pneumococcal Conjugate-13 05/06/2014  . Pneumococcal  Polysaccharide-23 02/09/2011  . Pneumococcal-Unspecified 02/06/2010, 02/09/2012  . Td 05/10/2003   Past Surgical History  Procedure Laterality Date  . Back surgery    . Bladder repair    . Hemorrhoid surgery    . Cholecystectomy      Patient unsure of date.  [name] Appendectomy      patient unsure of date  . Total abdominal hysterectomy  1975    partial  . Eye surgery  11/2000    bilateral cataracts with lens implant  . Hiatal hernia repair  06/03/2011    Procedure: LAPAROSCOPIC REPAIR OF HIATAL HERNIA;  Surgeon: Adin Hector, MD;  Location: [facility];  Service: General;  Laterality: N/A;  . Laparoscopic nissen fundoplication  [id number]    Procedure: LAPAROSCOPIC NISSEN FUNDOPLICATION;  Surgeon: Adin Hector, MD;  Location: [facility];  Service: General;  Laterality: N/A;  . Lumbar laminectomy/decompression microdiscectomy Right 06/26/2012    Procedure: LUMBAR LAMINECTOMY/DECOMPRESSION MICRODISCECTOMY;  Surgeon: Jessy Oto, MD;  Location: [place];  Service: Orthopedics;  Laterality: Right;  RIGHT L4-5 MICRODISCECTOMY  . Esophagogastroduodenoscopy N/A 03/15/2013    Procedure: ESOPHAGOGASTRODUODENOSCOPY (EGD);  Surgeon: Jerene Bears, MD;  [facility];  Service: Endoscopy;  Laterality: N/A;  . Esophageal manometry N/A 03/25/2013    Procedure: ESOPHAGEAL MANOMETRY (EM);  Surgeon: Inda Castle, MD;  Location: [facility];  Service: Endoscopy;  Laterality: N/A;  . Hiatal hernia repair N/A 04/24/2013    Procedure: LAPAROSCOPIC  REPAIR RECURRENT PARASOPHAGEAL HIATAL HERNIA WITH FUNDOPLICATION;  Surgeon: Adin Hector, MD;  Location: [facility];  Service: General;  Laterality: N/A;  . Insertion of mesh N/A 04/24/2013    Procedure: INSERTION OF MESH;  Surgeon: Adin Hector, MD;  Location: [facility];  Service: General;  Laterality: N/A;  . Laparoscopic lysis of adhesions N/A 04/24/2013    Procedure: LAPAROSCOPIC LYSIS OF ADHESIONS;  Surgeon: Adin Hector, MD;  Location: [facility];   Service: General;  Laterality: N/A;  . Fixation kyphoplasty lumbar spine  X 2    "L3-4"  . Coronary angioplasty  11/16/2001    1 stent  . Lumbar laminectomy/decompression microdiscectomy N/A 05/29/2015    Procedure: RIGHT L2-3 MICRODISCECTOMY;  Surgeon: Jessy Oto, MD;  Location: [place];  Service: Orthopedics;  Laterality: N/A;   Family History  Problem Relation Age of Onset  . Cancer Mother     colon   . Colon cancer Mother 80  . Heart disease Father 63    heart attack   Social History  Substance Use Topics  . Smoking status: Never Smoker   . Smokeless tobacco: Never Used  . Alcohol Use: No    ROS Constitutional: Denies fever, chills, weight loss/gain, headaches, insomnia,  night sweats, and change in appetite. Does c/o fatigue. Eyes: Denies redness, blurred vision, diplopia, discharge, itchy, watery eyes.  ENT: Denies discharge, congestion, post nasal drip, epistaxis, sore throat, earache, hearing loss, dental pain, Tinnitus, Vertigo, Sinus pain, snoring.  Cardio: Denies chest pain, palpitations, irregular heartbeat, syncope, dyspnea, diaphoresis, orthopnea, PND, claudication, edema Respiratory: denies cough, dyspnea, DOE, pleurisy, hoarseness, laryngitis, wheezing.  Gastrointestinal: Denies dysphagia, heartburn, reflux, water brash, pain, cramps, nausea, vomiting, bloating, diarrhea, constipation, hematemesis, melena, hematochezia, jaundice, hemorrhoids Genitourinary: Denies dysuria, frequency, urgency, nocturia, hesitancy, discharge, hematuria, flank pain Breast: Breast lumps, nipple discharge, bleeding.  Musculoskeletal: Denies arthralgia, myalgia, stiffness, Jt. Swelling, pain, limp, and strain/sprain. Denies falls. Skin: Denies puritis, rash, hives, warts, acne, eczema, changing in skin lesion Neuro: No weakness, tremor, incoordination, spasms, paresthesia, pain Psychiatric: Denies confusion, memory loss, sensory loss. Denies Depression. Endocrine: Denies change in weight,  skin, hair change, nocturia, and paresthesia, diabetic polys, visual blurring, hyper / hypo glycemic episodes.  Heme/Lymph: No excessive bleeding, bruising, enlarged lymph nodes.  Physical Exam  BP 138/82 mmHg  Pulse 84  Temp(Src) 97.6 F (36.4 C)  Resp 16  Ht 5\' 3"  (1.6 m)  Wt 144 lb 12.8 oz (65.681 kg)  BMI 25.66 kg/m2  General Appearance: Well nourished and in no apparent distress. Eyes: PERRLA, EOMs, conjunctiva no swelling or erythema, normal fundi and vessels. Sinuses: No frontal/maxillary tenderness ENT/Mouth: EACs patent / TMs  nl. Nares clear without erythema, swelling, mucoid exudates. Oral hygiene is good. No erythema, swelling, or exudate. Tongue normal, non-obstructing. Tonsils not swollen or erythematous. Hearing normal.  Neck: Supple, thyroid normal. No bruits, nodes or JVD. Respiratory: Respiratory effort normal.  BS equal and clear bilateral without rales, rhonci, wheezing or stridor. Cardio: Heart sounds are normal with regular rate and rhythm and no murmurs, rubs or gallops. Peripheral pulses are normal and equal bilaterally without edema. No aortic or femoral bruits. Chest: symmetric with normal excursions and percussion. Breasts: Symmetric, without lumps, nipple discharge, retractions, or fibrocystic changes.  Abdomen: Flat, soft, with bowl sounds. Nontender, no guarding, rebound, hernias, masses, or organomegaly.  Lymphatics: Non tender without lymphadenopathy.   Musculoskeletal: Full ROM all peripheral extremities, joint stability, 5/5 strength, and normal gait. Skin: Warm and dry without rashes, lesions, cyanosis, clubbing or  ecchymosis.  Neuro: Cranial nerves intact, reflexes equal bilaterally. Normal muscle tone, no cerebellar symptoms. Sensation intact.  Pysch: Alert and oriented X 3, flat affect, Insight and Judgment appropriate.   Assessment and Plan  1. Annual Preventative Screening Examination   2. Essential hypertension  - Microalbumin /  creatinine urine ratio - EKG 12-Lead - [place], RETROPERITNL ABD,  LTD - TSH  3. Mixed hyperlipidemia  - Lipid panel - TSH  4. Prediabetes  - Hemoglobin A1c - Insulin, random  5. Vitamin D deficiency  - VITAMIN D 25 Hydroxy  6. DDD (degenerative disc disease), lumbar   7. Chronic pain syndrome   8. Gastroesophageal reflux disease, esophagitis presence not specified   9. Medication management  - Urinalysis, Routine w reflex microscopic  - CBC with Differential/Platelet - BASIC METABOLIC PANEL WITH GFR - Hepatic function panel - Magnesium  10. Screening for rectal cancer  - POC Hemoccult Bld/Stl   11. Nerves  - ALPRAZolam (XANAX) 0.5 MG tablet; Take 1/2 to 1 tablet 2 or 3 x daily if needed for nerves  Dispense: 90 tablet; Refill: 5   Continue prudent diet as discussed, weight control, BP monitoring, regular exercise, and medications. Discussed med's effects and SE's. Screening labs and tests as requested with regular follow-up as recommended. Over 40 minutes of exam, counseling, chart review and high complex critical decision making was performed.

## 2015-07-08 LAB — MICROALBUMIN / CREATININE URINE RATIO: CREATININE, URINE: 44 mg/dL (ref 20–320)

## 2015-07-08 LAB — VITAMIN D 25 HYDROXY (VIT D DEFICIENCY, FRACTURES): VIT D 25 HYDROXY: 46 ng/mL (ref 30–100)

## 2015-07-08 LAB — INSULIN, RANDOM: INSULIN: 15.8 u[IU]/mL (ref 2.0–19.6)

## 2015-07-09 ENCOUNTER — Other Ambulatory Visit: Payer: Self-pay | Admitting: *Deleted

## 2015-07-09 DIAGNOSIS — IMO0001 Reserved for inherently not codable concepts without codable children: Secondary | ICD-10-CM

## 2015-07-09 MED ORDER — PRAVASTATIN SODIUM 40 MG PO TABS: 40.0000 mg | ORAL_TABLET | Freq: Every day | ORAL | Status: DC

## 2015-07-14 DIAGNOSIS — M5415 Radiculopathy, thoracolumbar region: Secondary | ICD-10-CM | POA: Diagnosis not present

## 2015-07-14 DIAGNOSIS — M5416 Radiculopathy, lumbar region: Secondary | ICD-10-CM | POA: Diagnosis not present

## 2015-07-22 ENCOUNTER — Other Ambulatory Visit: Payer: Self-pay | Admitting: *Deleted

## 2015-07-22 DIAGNOSIS — Z1212 Encounter for screening for malignant neoplasm of rectum: Secondary | ICD-10-CM

## 2015-07-22 LAB — POC HEMOCCULT BLD/STL (HOME/3-CARD/SCREEN)
Card #2 Fecal Occult Blod, POC: NEGATIVE
Card #3 Fecal Occult Blood, POC: NEGATIVE
Fecal Occult Blood, POC: NEGATIVE

## 2015-07-23 ENCOUNTER — Telehealth: Payer: Self-pay | Admitting: *Deleted

## 2015-07-23 NOTE — Telephone Encounter (Signed)
Patient called and complained of constipation and gas.  Per Dr Melford Aase, patient can try Gas-X and miralax.  Patient is aware.

## 2015-08-14 DIAGNOSIS — M9903 Segmental and somatic dysfunction of lumbar region: Secondary | ICD-10-CM | POA: Diagnosis not present

## 2015-08-14 DIAGNOSIS — M25551 Pain in right hip: Secondary | ICD-10-CM | POA: Diagnosis not present

## 2015-08-14 DIAGNOSIS — M5441 Lumbago with sciatica, right side: Secondary | ICD-10-CM | POA: Diagnosis not present

## 2015-08-14 DIAGNOSIS — M9905 Segmental and somatic dysfunction of pelvic region: Secondary | ICD-10-CM | POA: Diagnosis not present

## 2015-08-17 DIAGNOSIS — M9905 Segmental and somatic dysfunction of pelvic region: Secondary | ICD-10-CM | POA: Diagnosis not present

## 2015-08-17 DIAGNOSIS — M25551 Pain in right hip: Secondary | ICD-10-CM | POA: Diagnosis not present

## 2015-08-17 DIAGNOSIS — M9903 Segmental and somatic dysfunction of lumbar region: Secondary | ICD-10-CM | POA: Diagnosis not present

## 2015-08-17 DIAGNOSIS — M5441 Lumbago with sciatica, right side: Secondary | ICD-10-CM | POA: Diagnosis not present

## 2015-08-19 DIAGNOSIS — M9903 Segmental and somatic dysfunction of lumbar region: Secondary | ICD-10-CM | POA: Diagnosis not present

## 2015-08-19 DIAGNOSIS — M9905 Segmental and somatic dysfunction of pelvic region: Secondary | ICD-10-CM | POA: Diagnosis not present

## 2015-08-19 DIAGNOSIS — M5441 Lumbago with sciatica, right side: Secondary | ICD-10-CM | POA: Diagnosis not present

## 2015-08-19 DIAGNOSIS — M25551 Pain in right hip: Secondary | ICD-10-CM | POA: Diagnosis not present

## 2015-08-20 ENCOUNTER — Other Ambulatory Visit (HOSPITAL_COMMUNITY): Payer: Self-pay | Admitting: Specialist

## 2015-08-20 DIAGNOSIS — M9903 Segmental and somatic dysfunction of lumbar region: Secondary | ICD-10-CM | POA: Diagnosis not present

## 2015-08-20 DIAGNOSIS — M810 Age-related osteoporosis without current pathological fracture: Secondary | ICD-10-CM

## 2015-08-20 DIAGNOSIS — M5441 Lumbago with sciatica, right side: Secondary | ICD-10-CM | POA: Diagnosis not present

## 2015-08-20 DIAGNOSIS — M9905 Segmental and somatic dysfunction of pelvic region: Secondary | ICD-10-CM | POA: Diagnosis not present

## 2015-08-20 DIAGNOSIS — M25551 Pain in right hip: Secondary | ICD-10-CM | POA: Diagnosis not present

## 2015-08-27 ENCOUNTER — Encounter (HOSPITAL_COMMUNITY)
Admission: RE | Admit: 2015-08-27 | Discharge: 2015-08-27 | Disposition: A | Discharge status: Home / Self Care | Payer: PPO | Source: Ambulatory Visit | Attending: Specialist | Admitting: Specialist

## 2015-08-27 ENCOUNTER — Ambulatory Visit (HOSPITAL_COMMUNITY)
Admission: RE | Admit: 2015-08-27 | Discharge: 2015-08-27 | Disposition: A | Discharge status: Home / Self Care | Payer: PPO | Source: Ambulatory Visit | Attending: Specialist | Admitting: Specialist

## 2015-08-27 DIAGNOSIS — M810 Age-related osteoporosis without current pathological fracture: Secondary | ICD-10-CM | POA: Diagnosis not present

## 2015-08-27 DIAGNOSIS — R1030 Lower abdominal pain, unspecified: Secondary | ICD-10-CM | POA: Diagnosis not present

## 2015-08-27 DIAGNOSIS — M79604 Pain in right leg: Secondary | ICD-10-CM | POA: Diagnosis not present

## 2015-08-27 MED ORDER — TECHNETIUM TC 99M MEDRONATE IV KIT
25.0000 | PACK | Freq: Once | INTRAVENOUS | Status: AC | PRN
  Administered 2015-08-27: 25 via INTRAVENOUS

## 2015-09-02 ENCOUNTER — Other Ambulatory Visit: Payer: Self-pay | Admitting: Specialist

## 2015-09-02 DIAGNOSIS — R1031 Right lower quadrant pain: Secondary | ICD-10-CM

## 2015-09-02 DIAGNOSIS — M545 Low back pain: Secondary | ICD-10-CM

## 2015-09-02 DIAGNOSIS — H01025 Squamous blepharitis left lower eyelid: Secondary | ICD-10-CM | POA: Diagnosis not present

## 2015-09-02 DIAGNOSIS — M5416 Radiculopathy, lumbar region: Secondary | ICD-10-CM | POA: Diagnosis not present

## 2015-09-02 DIAGNOSIS — D2312 Other benign neoplasm of skin of left eyelid, including canthus: Secondary | ICD-10-CM | POA: Diagnosis not present

## 2015-09-02 DIAGNOSIS — M5415 Radiculopathy, thoracolumbar region: Secondary | ICD-10-CM | POA: Diagnosis not present

## 2015-09-02 DIAGNOSIS — H01022 Squamous blepharitis right lower eyelid: Secondary | ICD-10-CM | POA: Diagnosis not present

## 2015-09-02 DIAGNOSIS — H01021 Squamous blepharitis right upper eyelid: Secondary | ICD-10-CM | POA: Diagnosis not present

## 2015-09-02 DIAGNOSIS — H01024 Squamous blepharitis left upper eyelid: Secondary | ICD-10-CM | POA: Diagnosis not present

## 2015-09-02 DIAGNOSIS — H0014 Chalazion left upper eyelid: Secondary | ICD-10-CM | POA: Diagnosis not present

## 2015-09-08 DIAGNOSIS — G894 Chronic pain syndrome: Secondary | ICD-10-CM | POA: Diagnosis not present

## 2015-09-08 DIAGNOSIS — M5416 Radiculopathy, lumbar region: Secondary | ICD-10-CM | POA: Diagnosis not present

## 2015-09-08 DIAGNOSIS — Z79891 Long term (current) use of opiate analgesic: Secondary | ICD-10-CM | POA: Diagnosis not present

## 2015-09-08 DIAGNOSIS — M961 Postlaminectomy syndrome, not elsewhere classified: Secondary | ICD-10-CM | POA: Diagnosis not present

## 2015-09-10 ENCOUNTER — Ambulatory Visit
Admission: RE | Admit: 2015-09-10 | Discharge: 2015-09-10 | Disposition: A | Discharge status: Home / Self Care | Payer: PPO | Source: Ambulatory Visit | Attending: Specialist | Admitting: Specialist

## 2015-09-10 DIAGNOSIS — R1031 Right lower quadrant pain: Secondary | ICD-10-CM

## 2015-09-10 DIAGNOSIS — R3912 Poor urinary stream: Secondary | ICD-10-CM | POA: Diagnosis not present

## 2015-09-10 DIAGNOSIS — M545 Low back pain: Secondary | ICD-10-CM

## 2015-09-10 DIAGNOSIS — S32030A Wedge compression fracture of third lumbar vertebra, initial encounter for closed fracture: Secondary | ICD-10-CM | POA: Diagnosis not present

## 2015-09-10 DIAGNOSIS — S32010A Wedge compression fracture of first lumbar vertebra, initial encounter for closed fracture: Secondary | ICD-10-CM | POA: Diagnosis not present

## 2015-09-10 DIAGNOSIS — Z0000 Encounter for general adult medical examination without abnormal findings: Secondary | ICD-10-CM | POA: Diagnosis not present

## 2015-09-10 DIAGNOSIS — R351 Nocturia: Secondary | ICD-10-CM | POA: Diagnosis not present

## 2015-09-10 DIAGNOSIS — S32020A Wedge compression fracture of second lumbar vertebra, initial encounter for closed fracture: Secondary | ICD-10-CM | POA: Diagnosis not present

## 2015-09-10 MED ORDER — GADOBENATE DIMEGLUMINE 529 MG/ML IV SOLN
13.0000 mL | Freq: Once | INTRAVENOUS | Status: AC | PRN
  Administered 2015-09-10: 13 mL via INTRAVENOUS

## 2015-09-18 DIAGNOSIS — M4806 Spinal stenosis, lumbar region: Secondary | ICD-10-CM | POA: Diagnosis not present

## 2015-09-18 DIAGNOSIS — M5441 Lumbago with sciatica, right side: Secondary | ICD-10-CM | POA: Diagnosis not present

## 2015-09-28 ENCOUNTER — Other Ambulatory Visit: Payer: Self-pay | Admitting: *Deleted

## 2015-09-28 MED ORDER — GABAPENTIN 100 MG PO CAPS: 100.0000 mg | ORAL_CAPSULE | Freq: Three times a day (TID) | ORAL | Status: DC

## 2015-09-30 ENCOUNTER — Other Ambulatory Visit: Payer: Self-pay

## 2015-09-30 ENCOUNTER — Other Ambulatory Visit: Payer: Self-pay | Admitting: Internal Medicine

## 2015-09-30 MED ORDER — LOSARTAN POTASSIUM 100 MG PO TABS: ORAL_TABLET | ORAL | Status: DC

## 2015-10-07 DIAGNOSIS — Z79891 Long term (current) use of opiate analgesic: Secondary | ICD-10-CM | POA: Diagnosis not present

## 2015-10-07 DIAGNOSIS — M961 Postlaminectomy syndrome, not elsewhere classified: Secondary | ICD-10-CM | POA: Diagnosis not present

## 2015-10-07 DIAGNOSIS — G894 Chronic pain syndrome: Secondary | ICD-10-CM | POA: Diagnosis not present

## 2015-10-07 DIAGNOSIS — M5416 Radiculopathy, lumbar region: Secondary | ICD-10-CM | POA: Diagnosis not present

## 2015-10-08 ENCOUNTER — Ambulatory Visit (INDEPENDENT_AMBULATORY_CARE_PROVIDER_SITE_OTHER): Payer: PPO | Admitting: Physician Assistant

## 2015-10-08 ENCOUNTER — Encounter: Payer: Self-pay | Admitting: Physician Assistant

## 2015-10-08 VITALS — BP 138/66 | HR 76 | Temp 97.0°F | Resp 16 | Ht 64.0 in | Wt 147.2 lb

## 2015-10-08 DIAGNOSIS — E782 Mixed hyperlipidemia: Secondary | ICD-10-CM

## 2015-10-08 DIAGNOSIS — E559 Vitamin D deficiency, unspecified: Secondary | ICD-10-CM

## 2015-10-08 DIAGNOSIS — R7303 Prediabetes: Secondary | ICD-10-CM | POA: Diagnosis not present

## 2015-10-08 DIAGNOSIS — I10 Essential (primary) hypertension: Secondary | ICD-10-CM

## 2015-10-08 DIAGNOSIS — G894 Chronic pain syndrome: Secondary | ICD-10-CM | POA: Diagnosis not present

## 2015-10-08 DIAGNOSIS — R7309 Other abnormal glucose: Secondary | ICD-10-CM | POA: Diagnosis not present

## 2015-10-08 DIAGNOSIS — Z79899 Other long term (current) drug therapy: Secondary | ICD-10-CM | POA: Diagnosis not present

## 2015-10-08 LAB — CBC WITH DIFFERENTIAL/PLATELET
Basophils Absolute: 61 cells/uL (ref 0–200)
Basophils Relative: 1 %
EOS PCT: 2 %
Eosinophils Absolute: 122 cells/uL (ref 15–500)
HCT: 44.5 % (ref 35.0–45.0)
HEMOGLOBIN: 14.7 g/dL (ref 11.7–15.5)
LYMPHS ABS: 1647 {cells}/uL (ref 850–3900)
LYMPHS PCT: 27 %
MCH: 31.5 pg (ref 27.0–33.0)
MCHC: 33 g/dL (ref 32.0–36.0)
MCV: 95.3 fL (ref 80.0–100.0)
MONOS PCT: 11 %
MPV: 10.6 fL (ref 7.5–12.5)
Monocytes Absolute: 671 cells/uL (ref 200–950)
NEUTROS PCT: 59 %
Neutro Abs: 3599 cells/uL (ref 1500–7800)
PLATELETS: 364 10*3/uL (ref 140–400)
RBC: 4.67 MIL/uL (ref 3.80–5.10)
RDW: 14.5 % (ref 11.0–15.0)
WBC: 6.1 10*3/uL (ref 3.8–10.8)

## 2015-10-08 LAB — HEPATIC FUNCTION PANEL
ALBUMIN: 4.2 g/dL (ref 3.6–5.1)
ALT: 22 U/L (ref 6–29)
AST: 23 U/L (ref 10–35)
Alkaline Phosphatase: 52 U/L (ref 33–130)
BILIRUBIN DIRECT: 0.2 mg/dL (ref <=0.2)
BILIRUBIN INDIRECT: 0.6 mg/dL (ref 0.2–1.2)
TOTAL PROTEIN: 6.7 g/dL (ref 6.1–8.1)
Total Bilirubin: 0.8 mg/dL (ref 0.2–1.2)

## 2015-10-08 LAB — LIPID PANEL
CHOLESTEROL: 150 mg/dL (ref 125–200)
HDL: 73 mg/dL (ref >=46)
LDL CALC: 58 mg/dL (ref <130)
Total CHOL/HDL Ratio: 2.1 Ratio (ref <=5.0)
Triglycerides: 93 mg/dL (ref <150)
VLDL: 19 mg/dL (ref <30)

## 2015-10-08 LAB — BASIC METABOLIC PANEL WITH GFR
BUN: 18 mg/dL (ref 7–25)
CALCIUM: 10 mg/dL (ref 8.6–10.4)
CO2: 29 mmol/L (ref 20–31)
CREATININE: 0.87 mg/dL (ref 0.60–0.93)
Chloride: 105 mmol/L (ref 98–110)
GFR, EST AFRICAN AMERICAN: 74 mL/min (ref >=60)
GFR, EST NON AFRICAN AMERICAN: 64 mL/min (ref >=60)
Glucose, Bld: 84 mg/dL (ref 65–99)
Potassium: 4.8 mmol/L (ref 3.5–5.3)
SODIUM: 143 mmol/L (ref 135–146)

## 2015-10-08 LAB — TSH: TSH: 0.73 mIU/L

## 2015-10-08 LAB — HEMOGLOBIN A1C
Hgb A1c MFr Bld: 5.7 % — ABNORMAL HIGH (ref <5.7)
MEAN PLASMA GLUCOSE: 117 mg/dL

## 2015-10-08 NOTE — Progress Notes (Signed)
Patient ID: SANVI OGUINN, female   DOB: [birth year], 80 y.o.   MRN: [id number]  Assessment and Plan:  Hypertension:  -Continue medication,  -monitor blood pressure at home.  -Continue DASH diet.   -Reminder to go to the ER if any CP, SOB, nausea, dizziness, severe HA, changes vision/speech, left arm numbness and tingling, and jaw pain.  Cholesterol: -Continue diet and exercise.  -Check cholesterol.   Pre-diabetes: -Continue diet and exercise.  -Check A1C  Vitamin D Def: -check level -continue medications.   Constipation Try amitza samples 61mcg, had diarrhea with 76mcg, take empty stomach with water  Too early for wellness, last done in OCT Future Appointments Date Time Provider [place]  01/14/2016 9:45 AM [name], MD [facility] None  07/06/2016 2:00 PM [name], MD [facility] None    Continue diet and meds as discussed. Further disposition pending results of labs.  HPI 80 y.o. female  presents for 3 month follow up with hypertension, hyperlipidemia, prediabetes and vitamin D.   Her blood pressure has been controlled at home, today their BP is BP: 138/66 mmHg.   She does not workout.  She cannot do a whole lot of exercise secondary to her back pain.   She denies chest pain, shortness of breath, dizziness.  Has severe back pain, following with pain management, Dr. Hardin Negus, may need surgery, on gabapentin 100mg  BID and 300mg  at night    She is on cholesterol medication and denies myalgias. Her cholesterol is at goal. The cholesterol last visit was:   Lab Results  Component Value Date   CHOL 178 07/07/2015   HDL 83 07/07/2015   LDLCALC 74 07/07/2015   TRIG 107 07/07/2015   CHOLHDL 2.1 07/07/2015    She has not been working on diet and exercise for prediabetes, and denies foot ulcerations, nausea, paresthesia of the feet, polydipsia, polyuria, visual disturbances and vomiting. Last A1C in the office was:  Lab Results  Component Value Date   HGBA1C  5.7* 07/07/2015   Patient is on Vitamin D supplement.  Lab Results  Component Value Date   VD25OH 46 07/07/2015      Current Medications:  Current Outpatient Prescriptions on File Prior to Visit  Medication Sig Dispense Refill  . albuterol (PROVENTIL HFA;VENTOLIN HFA) 108 (90 BASE) MCG/ACT inhaler Inhale 1-2 puffs into the lungs every 6 (six) hours as needed for wheezing or shortness of breath.    [name] alendronate (FOSAMAX) 70 MG tablet Take 70 mg by mouth once a week. Take with a full glass of water on an empty stomach. Take on Mondays    . ALPRAZolam (XANAX) 0.5 MG tablet Take 1/2 to 1 tablet 2 or 3 x daily if needed for nerves 90 tablet 5  . amLODipine (NORVASC) 10 MG tablet Take 1 tablet (10 mg total) by mouth at bedtime. 30 tablet 1  . Cholecalciferol (VITAMIN D3) 2000 UNITS TABS Take 6,000 Units by mouth daily.     [name] docusate sodium (COLACE) 250 MG capsule Take 1 capsule (250 mg total) by mouth 2 (two) times daily. 30 capsule 0  . feeding supplement, GLUCERNA SHAKE, (GLUCERNA SHAKE) LIQD Take 237 mLs by mouth 3 (three) times daily between meals. 90 Can 0  . gabapentin (NEURONTIN) 100 MG capsule Take 1 capsule (100 mg total) by mouth 3 (three) times daily. 90 capsule 2  . ipratropium-albuterol (DUONEB) 0.5-2.5 (3) MG/3ML SOLN USE ONE VIAL IN NEBULIZER 4 TIMES DAILY (Patient taking differently: Inhale 3 mLs into the lungs  every 6 (six) hours as needed. For shortness of breath) 360 mL 99  . losartan (COZAAR) 100 MG tablet TAKE ONE TABLET BY MOUTH ONCE DAILY ( DISCONTINUE BENICAR) 90 tablet 1  . metFORMIN (GLUCOPHAGE-XR) 500 MG 24 hr tablet TAKE ONE TO TWO TABLETS BY MOUTH TWICE DAILY WITH MEALS FOR DIABETES 360 tablet 1  . metoprolol (LOPRESSOR) 50 MG tablet TAKE ONE TABLET BY MOUTH TWICE DAILY FOR BLOOD PRESSURE (Patient taking differently: take 0.5 tablet by mouth at bedtime) 180 tablet 1  . mometasone-formoterol (DULERA) 200-5 MCG/ACT AERO Inhale 2 puffs into the lungs 2 (two) times daily.  (Patient taking differently: Inhale 2 puffs into the lungs 2 (two) times daily as needed for wheezing. ) 13 g 3  . nitroGLYCERIN (NITROSTAT) 0.4 MG SL tablet Place 1 tablet (0.4 mg total) under the tongue every 5 (five) minutes as needed for chest pain. 90 tablet 3  . ondansetron (ZOFRAN ODT) 4 MG disintegrating tablet Take 1 tablet (4 mg total) by mouth every 8 (eight) hours as needed for nausea or vomiting. 10 tablet 0  . orphenadrine (NORFLEX) 100 MG tablet Take 1 tablet (100 mg total) by mouth 2 (two) times daily as needed for muscle spasms. 40 tablet 0  . OVER THE COUNTER MEDICATION Take 1,000 mg by mouth 2 (two) times daily. [place]al Calcium 1000mg     . OVER THE COUNTER MEDICATION Take 400 mg by mouth 2 (two) times daily. Magnesium Carbonate 400mg     . oxyCODONE (OXY IR/ROXICODONE) 5 MG immediate release tablet Take 5 mg by mouth every 6 (six) hours as needed for moderate pain or severe pain.    [name] oxyCODONE-acetaminophen (PERCOCET/ROXICET) 5-325 MG tablet Take 1-2 tablets by mouth every 4 (four) hours as needed for moderate pain. 70 tablet 0  . pantoprazole (PROTONIX) 40 MG tablet Take 1 tablet (40 mg total) by mouth daily. 90 tablet 2  . polyethylene glycol (MIRALAX / GLYCOLAX) packet Take 17 g by mouth 2 (two) times daily.    . potassium chloride SA (K-DUR,KLOR-CON) 20 MEQ tablet Take 1 tablet (20 mEq total) by mouth 2 (two) times daily. 30 tablet 0  . pravastatin (PRAVACHOL) 40 MG tablet Take 1 tablet (40 mg total) by mouth daily. 90 tablet 1  . prochlorperazine (COMPAZINE) 5 MG tablet Take 1 capsule 3 x day before meals only if needed for Nausea 90 tablet 5  . senna (SENOKOT) 8.6 MG TABS tablet Take 1 tablet by mouth 2 (two) times daily.     . silodosin (RAPAFLO) 8 MG CAPS capsule Take 8 mg by mouth daily with breakfast.    . sucralfate (CARAFATE) 1 G tablet TAKE ONE TABLET BY MOUTH 4 TIMES DAILY BEFORE MEALS AND AT BEDTIME FOR HEARTBURN 100 tablet 0   No current facility-administered  medications on file prior to visit.    Medical History:  Past Medical History  Diagnosis Date  . CAD (coronary artery disease)     LHC 2003 with 40% pLAD, 30% mLAD, 100% D1 (moderate vessel), 100%  D2 (small vessel), 95% mid small OM2.  Pt had PTCA to D1;  D2 and OM2 small vessels and tx medically;  Last Myoview (2012): anterior infarct seen with scar, no ischemia. EF normal. Pt managed medically;  Lexiscan Myoview (12/14):  Low risk; ant scar with very mild peri-infarct ischemia; EF 55% with ant AK   . Hyperlipidemia     takes Pravastatin daily  . TIA (transient ischemic attack)     no residual  effects noted  . DIVERTICULOSIS, COLON 08/28/2008    Qualifier: Diagnosis of  By: [name], [name], [name]    . Chronic nausea     takes Zofran daily as needed  . Chronic back pain   . PONV (postoperative nausea and vomiting)   . CAD (coronary artery disease)   . Osteoporosis   . Anxiety     takes Xanax daily as needed  . Asthma     Albuterol daily as needed  . Constipation     takes Senokot and Miralax daily  . Hypertension     takes Amlodipine,Metoprolol,and Losartan daily  . History of bronchitis several of yrs ago  . History of vertigo     no meds  . Chronic back pain     HNP  . Osteopenia   . Hiatal hernia     hx of  . GERD (gastroesophageal reflux disease)     hx of  . History of colon polyps     benign  . Slow urinary stream     takes Rapaflo daily    Allergies:  Allergies  Allergen Reactions  . Aspartame Diarrhea and Nausea And Vomiting  . Daliresp [Roflumilast] Nausea And Vomiting  . Levofloxacin Nausea And Vomiting  . Shellfish Allergy Other (See Comments)    Flushing  . Atorvastatin Other (See Comments)    Muscle aches  . Ace Inhibitors Cough    Per Dr Melvyn Novas pulmonology 2013  . Biaxin [Clarithromycin] Other (See Comments)    PATIENT CAN TOLERATE Z-PAK.  Is written on patient's paper chart.  . Erythromycin Other (See Comments)    PATIENT CAN TOLERATE Z-PAK.  It  is written on patient's paper chart.  . Hydrocodone Nausea Only     Review of Systems:  Review of Systems  Constitutional: Positive for malaise/fatigue. Negative for fever, chills, weight loss and diaphoresis.  HENT: Negative for congestion, ear discharge, sore throat and tinnitus.   Respiratory: Negative for cough, sputum production, shortness of breath and wheezing.   Cardiovascular: Negative for chest pain, palpitations and leg swelling.  Gastrointestinal: Negative for heartburn, nausea, vomiting, abdominal pain, diarrhea, constipation, blood in stool and melena.  Genitourinary: Negative for dysuria, urgency and frequency.  Musculoskeletal: Positive for back pain.  Skin: Negative.   Neurological: Negative for dizziness, sensory change, focal weakness, loss of consciousness and headaches.    Family history- Review and unchanged  Social history- Review and unchanged  Physical Exam: BP 138/66 mmHg  Pulse 76  Temp(Src) 97 F (36.1 C) (Temporal)  Resp 16  Ht 5\' 4"  (1.626 m)  Wt 147 lb 3.2 oz (66.769 kg)  BMI 25.25 kg/m2  SpO2 96% Wt Readings from Last 3 Encounters:  10/08/15 147 lb 3.2 oz (66.769 kg)  07/07/15 144 lb 12.8 oz (65.681 kg)  05/29/15 141 lb (63.957 kg)    General Appearance: Well nourished well developed, Dressed nicely in no apparent distress. Eyes: PERRLA, EOMs, conjunctiva no swelling or erythema ENT/Mouth: Ear canals normal without obstruction, swelling, erythma, discharge.  TMs normal bilaterally.  Oropharynx moist, clear, without exudate, or postoropharyngeal swelling. Neck: Supple, thyroid normal,no cervical adenopathy  Respiratory: Respiratory effort normal, Breath sounds clear A&P without rhonchi, wheeze, or rale.  No retractions, no accessory usage. Cardio: RRR with no MRGs. Brisk peripheral pulses without edema. 2+ DP, PT pulses bilaterally Abdomen: Soft, + BS,  Non tender, no guarding, rebound, hernias, masses. Musculoskeletal: Full ROM, 5/5  strength, antaglic gait, mild skeletal kyphosis Skin: Warm, dry without rashes, lesions,  ecchymosis.  Neuro: Awake and oriented X 3, Cranial nerves intact. Normal muscle tone, no cerebellar symptoms. Psych: Normal affect, Insight and Judgment appropriate.    Vicie Mutters, PA-C 10:21 AM [facility]

## 2015-10-08 NOTE — Patient Instructions (Addendum)
Try the amitiza 57mcg once daily with water, on empty stomach, can increase to twice a day if once a day does not help  Can increase gabapentin to 200mg  in the afternoon and morning if you need to for pain, do this slowly as it can cause drowsiness.   About Constipation  Constipation Overview Constipation is the most common gastrointestinal complaint - about 4 million Americans experience constipation and make 2.5 million physician visits a year to get help for the problem.  Constipation can occur when the colon absorbs too much water, the colon's muscle contraction is slow or sluggish, and/or there is delayed transit time through the colon.  The result is stool that is hard and dry.  Indicators of constipation include straining during bowel movements greater than 25% of the time, having fewer than three bowel movements per week, and/or the feeling of incomplete evacuation.  There are established guidelines ([place] II ) for defining constipation. A person needs to have two or more of the following symptoms for at least 12 weeks (not necessarily consecutive) in the preceding 12 months: . Straining in  greater than 25% of bowel movements . Lumpy or hard stools in greater than 25% of bowel movements . Sensation of incomplete emptying in greater than 25% of bowel movements . Sensation of anorectal obstruction/blockade in greater than 25% of bowel movements . Manual maneuvers to help empty greater than 25% of bowel movements (e.g., digital evacuation, support of the pelvic floor)  . Less than  3 bowel movements/week . Loose stools are not present, and criteria for irritable bowel syndrome are insufficient  Common Causes of Constipation . Lack of fiber in your diet . Lack of physical activity . Medications, including iron and calcium supplements  . Dairy intake . Dehydration . Abuse of laxatives  Travel  Irritable Bowel Syndrome  Pregnancy  Luteal phase of menstruation (after ovulation and  before menses)  Colorectal problems  Intestinal Dysfunction  Treating Constipation  There are several ways of treating constipation, including changes to diet and exercise, use of laxatives, adjustments to the pelvic floor, and scheduled toileting.  These treatments include: . increasing fiber and fluids in the diet  . increasing physical activity . learning muscle coordination   learning proper toileting techniques and toileting modifications   designing and sticking  to a toileting schedule     2007, [employer] Doc.22

## 2015-10-19 DIAGNOSIS — M4806 Spinal stenosis, lumbar region: Secondary | ICD-10-CM | POA: Diagnosis not present

## 2015-10-19 DIAGNOSIS — M5416 Radiculopathy, lumbar region: Secondary | ICD-10-CM | POA: Diagnosis not present

## 2015-10-23 DIAGNOSIS — G894 Chronic pain syndrome: Secondary | ICD-10-CM | POA: Diagnosis not present

## 2015-10-23 DIAGNOSIS — M4726 Other spondylosis with radiculopathy, lumbar region: Secondary | ICD-10-CM | POA: Diagnosis not present

## 2015-10-23 DIAGNOSIS — M5416 Radiculopathy, lumbar region: Secondary | ICD-10-CM | POA: Diagnosis not present

## 2015-10-23 DIAGNOSIS — Z79891 Long term (current) use of opiate analgesic: Secondary | ICD-10-CM | POA: Diagnosis not present

## 2015-10-23 DIAGNOSIS — M961 Postlaminectomy syndrome, not elsewhere classified: Secondary | ICD-10-CM | POA: Diagnosis not present

## 2015-10-29 DIAGNOSIS — M5416 Radiculopathy, lumbar region: Secondary | ICD-10-CM | POA: Diagnosis not present

## 2015-10-29 DIAGNOSIS — M4806 Spinal stenosis, lumbar region: Secondary | ICD-10-CM | POA: Diagnosis not present

## 2015-11-04 DIAGNOSIS — M4726 Other spondylosis with radiculopathy, lumbar region: Secondary | ICD-10-CM | POA: Diagnosis not present

## 2015-11-04 DIAGNOSIS — G894 Chronic pain syndrome: Secondary | ICD-10-CM | POA: Diagnosis not present

## 2015-11-04 DIAGNOSIS — M961 Postlaminectomy syndrome, not elsewhere classified: Secondary | ICD-10-CM | POA: Diagnosis not present

## 2015-11-04 DIAGNOSIS — Z79891 Long term (current) use of opiate analgesic: Secondary | ICD-10-CM | POA: Diagnosis not present

## 2015-11-13 DIAGNOSIS — H01024 Squamous blepharitis left upper eyelid: Secondary | ICD-10-CM | POA: Diagnosis not present

## 2015-11-13 DIAGNOSIS — H01025 Squamous blepharitis left lower eyelid: Secondary | ICD-10-CM | POA: Diagnosis not present

## 2015-11-13 DIAGNOSIS — H01021 Squamous blepharitis right upper eyelid: Secondary | ICD-10-CM | POA: Diagnosis not present

## 2015-11-13 DIAGNOSIS — Z961 Presence of intraocular lens: Secondary | ICD-10-CM | POA: Diagnosis not present

## 2015-11-13 DIAGNOSIS — H26492 Other secondary cataract, left eye: Secondary | ICD-10-CM | POA: Diagnosis not present

## 2015-11-13 DIAGNOSIS — H43811 Vitreous degeneration, right eye: Secondary | ICD-10-CM | POA: Diagnosis not present

## 2015-11-13 DIAGNOSIS — H04123 Dry eye syndrome of bilateral lacrimal glands: Secondary | ICD-10-CM | POA: Diagnosis not present

## 2015-11-13 DIAGNOSIS — D3131 Benign neoplasm of right choroid: Secondary | ICD-10-CM | POA: Diagnosis not present

## 2015-11-13 DIAGNOSIS — H01022 Squamous blepharitis right lower eyelid: Secondary | ICD-10-CM | POA: Diagnosis not present

## 2015-11-13 DIAGNOSIS — H0014 Chalazion left upper eyelid: Secondary | ICD-10-CM | POA: Diagnosis not present

## 2015-11-25 ENCOUNTER — Encounter: Payer: Self-pay | Admitting: Internal Medicine

## 2015-11-25 ENCOUNTER — Ambulatory Visit (INDEPENDENT_AMBULATORY_CARE_PROVIDER_SITE_OTHER): Payer: PPO | Admitting: Internal Medicine

## 2015-11-25 VITALS — BP 126/60 | HR 76 | Temp 98.4°F | Resp 18 | Ht 64.0 in | Wt 146.0 lb

## 2015-11-25 DIAGNOSIS — J069 Acute upper respiratory infection, unspecified: Secondary | ICD-10-CM

## 2015-11-25 MED ORDER — PREDNISONE 20 MG PO TABS: ORAL_TABLET | ORAL | Status: DC

## 2015-11-25 MED ORDER — TOBRAMYCIN 0.3 % OP OINT: TOPICAL_OINTMENT | Freq: Two times a day (BID) | OPHTHALMIC | Status: DC

## 2015-11-25 MED ORDER — AZITHROMYCIN 250 MG PO TABS
ORAL_TABLET | ORAL | Status: DC
Start: 2015-11-25 — End: 2015-12-11

## 2015-11-25 NOTE — Progress Notes (Signed)
HPI  Patient presents to the office for evaluation of cough.  It has been going on for 1 weeks.  Patient reports all the time, wet, barky.  They also endorse sputum production, wheezing and no runny nose, congestion, ear pain.  No headaches.  .  They have tried none.  They report that nothing has worked.  They denies other sick contacts.  She is having surgery on the 31st.  She is have a lumbar fusion at L4-L5.   Review of Systems  Constitutional: Negative for fever, chills and malaise/fatigue.  HENT: Positive for congestion. Negative for ear pain and sore throat.   Respiratory: Positive for cough, sputum production, shortness of breath and wheezing.   Cardiovascular: Negative for chest pain and palpitations.  Skin: Negative.   Neurological: Negative for headaches.    PE:  General:  Alert and non-toxic, WDWN, NAD HEENT: NCAT, PERLA, EOM normal, no occular discharge or erythema.  Nasal mucosal edema with sinus tenderness to palpation.  Oropharynx clear with minimal oropharyngeal edema and erythema.  Mucous membranes moist and pink. Neck:  Cervical adenopathy Chest:  RRR no MRGs.  Lungs clear to auscultation A&P with no wheezes rhonchi or rales.   Abdomen: +BS x 4 quadrants, soft, non-tender, no guarding, rigidity, or rebound. Skin: warm and dry no rash Neuro: A&Ox4, CN II-XII grossly intact  Assessment and Plan:   1. Acute URI zpak duoneb given here duoneb at home up to every six hours as needed for shortness of breath Stop dulera breo x 14 days, sample given here in the office spiriva  Prednisone Call office if worsening breathing problems.

## 2015-11-25 NOTE — Patient Instructions (Addendum)
Please stop dulera inhaler.  Please start using breo inhaler once per day.   Make sure you rinse your mouth out after you use this.  You will take this for two weeks and then you can go back to your dulera.   Please use the spiriva once in the morning and once in the evening.  Wring it like a towel, click the lid open.  Then push the button and take a long slow deep breath in.  Please use this until it is gone.   Please take the zpak until it is gone.  Please take the prednisone until it is gone.  Please use the ointment on your eye twice daily.  Continue to use warm compresses on the eye as well as gently massaging it.

## 2015-12-02 DIAGNOSIS — Z79891 Long term (current) use of opiate analgesic: Secondary | ICD-10-CM | POA: Diagnosis not present

## 2015-12-02 DIAGNOSIS — M961 Postlaminectomy syndrome, not elsewhere classified: Secondary | ICD-10-CM | POA: Diagnosis not present

## 2015-12-02 DIAGNOSIS — M4726 Other spondylosis with radiculopathy, lumbar region: Secondary | ICD-10-CM | POA: Diagnosis not present

## 2015-12-02 DIAGNOSIS — G894 Chronic pain syndrome: Secondary | ICD-10-CM | POA: Diagnosis not present

## 2015-12-03 NOTE — Pre-Procedure Instructions (Addendum)
[name] [name]  12/03/2015      [facility] [place], [place] - [address] [place] [address] Phone: [phone] Fax: [phone]  [id number] - [place], [place] [id number]  [place] [id number] Phone: [phone] Fax: [phone]    Your procedure is scheduled on Monday, July 31.   Report to [facility] Admitting at 5:30 A.M.              Your surgery or procedure is scheduled for 7:30 AM   Call this number if you have problems the morning of surgery: [phone]    Remember:  Do not eat food or drink liquids after midnight Sunday, July 30.  Take these medicines the morning of surgery with A SIP OF WATER:amLODipine ([place]),    pantoprazole (PROTONIX). Silodosin(rapaflo),eye drops                 Take if needed:xanax,oxyCODONE-acetaminophen (PERCOCET/ROXICET), nitroGLYCERIN (NITROSTAT). May use inhalers and nublizers if needed(bring albuterol).  STOP all herbel meds, nsaids (aleve,naproxen,advil,ibuprofen) including vitamins ,aspirin .     Do not wear jewelry, make-up or nail polish.  Do not wear lotions, powders, or perfumes.  You may wear deoderant.  Do not shave 48 hours prior to surgery.  Men may shave face and neck.  Do not bring valuables to the hospital.  [facility] is not responsible for any belongings or valuables.  Contacts, dentures or bridgework may not be worn into surgery.  Leave your suitcase in the car.  After surgery it may be brought to your room.  For patients admitted to the hospital, discharge time will be determined by your treatment team.  Special instructions:  - Special Instructions: [facility] - Preparing for Surgery  Before surgery, you can play an important role.  Because skin is not sterile, your skin needs to be as free of germs as possible.  You can reduce the number of germs on you skin by  washing with CHG (chlorahexidine gluconate) soap before surgery.  CHG is an antiseptic cleaner which kills germs and bonds with the skin to continue killing germs even after washing.  Please DO NOT use if you have an allergy to CHG or antibacterial soaps.  If your skin becomes reddened/irritated stop using the CHG and inform your nurse when you arrive at Short Stay.  Do not shave (including legs and underarms) for at least 48 hours prior to the first CHG shower.  You may shave your face.  Please follow these instructions carefully:   1.  Shower with CHG Soap the night before surgery and the morning of Surgery.  2.  If you choose to wash your hair, wash your hair first as usual with your normal shampoo.  3.  After you shampoo, rinse your hair and body thoroughly to remove the Shampoo.  4.  Use CHG as you would any other liquid soap.  You can apply chg directly  to the skin and wash gently with scrungie or a clean washcloth.  5.  Apply the CHG Soap to your body ONLY FROM THE NECK DOWN.  Do not use on open wounds or open sores.  Avoid contact with your eyes ears, mouth and genitals (private parts).  Wash genitals (private parts)       with your normal soap.  6.  Wash thoroughly, paying special attention to the area where your  surgery will be performed.  7.  Thoroughly rinse your body with warm water from the neck down.  8.  DO NOT shower/wash with your normal soap after using and rinsing off the CHG Soap.  9.  Pat yourself dry with a clean towel.            10.  Wear clean pajamas.            11.  Place clean sheets on your bed the night of your first shower and do not sleep with pets.  Day of Surgery  Do not apply any lotions/deodorants the morning of surgery.  Please wear clean clothes to the hospital/surgery center.  Please read over the  fact sheets that you were given. -

## 2015-12-04 ENCOUNTER — Encounter (HOSPITAL_COMMUNITY)
Admission: RE | Admit: 2015-12-04 | Discharge: 2015-12-04 | Disposition: A | Discharge status: Home / Self Care | Payer: PPO | Source: Ambulatory Visit | Attending: Specialist | Admitting: Specialist

## 2015-12-04 ENCOUNTER — Ambulatory Visit (HOSPITAL_COMMUNITY)
Admission: RE | Admit: 2015-12-04 | Discharge: 2015-12-04 | Disposition: A | Discharge status: Home / Self Care | Payer: PPO | Source: Ambulatory Visit | Attending: Surgery | Admitting: Surgery

## 2015-12-04 ENCOUNTER — Other Ambulatory Visit (HOSPITAL_COMMUNITY): Payer: PPO

## 2015-12-04 ENCOUNTER — Encounter (HOSPITAL_COMMUNITY): Payer: Self-pay

## 2015-12-04 DIAGNOSIS — J984 Other disorders of lung: Secondary | ICD-10-CM | POA: Diagnosis not present

## 2015-12-04 DIAGNOSIS — K219 Gastro-esophageal reflux disease without esophagitis: Secondary | ICD-10-CM | POA: Diagnosis not present

## 2015-12-04 DIAGNOSIS — I2510 Atherosclerotic heart disease of native coronary artery without angina pectoris: Secondary | ICD-10-CM | POA: Diagnosis not present

## 2015-12-04 DIAGNOSIS — E785 Hyperlipidemia, unspecified: Secondary | ICD-10-CM | POA: Insufficient documentation

## 2015-12-04 DIAGNOSIS — J45909 Unspecified asthma, uncomplicated: Secondary | ICD-10-CM | POA: Diagnosis not present

## 2015-12-04 DIAGNOSIS — Z01812 Encounter for preprocedural laboratory examination: Secondary | ICD-10-CM | POA: Insufficient documentation

## 2015-12-04 DIAGNOSIS — M4806 Spinal stenosis, lumbar region: Secondary | ICD-10-CM | POA: Insufficient documentation

## 2015-12-04 DIAGNOSIS — Z79899 Other long term (current) drug therapy: Secondary | ICD-10-CM | POA: Insufficient documentation

## 2015-12-04 DIAGNOSIS — M810 Age-related osteoporosis without current pathological fracture: Secondary | ICD-10-CM | POA: Insufficient documentation

## 2015-12-04 DIAGNOSIS — Z8673 Personal history of transient ischemic attack (TIA), and cerebral infarction without residual deficits: Secondary | ICD-10-CM | POA: Insufficient documentation

## 2015-12-04 DIAGNOSIS — Z01818 Encounter for other preprocedural examination: Secondary | ICD-10-CM

## 2015-12-04 DIAGNOSIS — Z9861 Coronary angioplasty status: Secondary | ICD-10-CM | POA: Insufficient documentation

## 2015-12-04 DIAGNOSIS — I700 Atherosclerosis of aorta: Secondary | ICD-10-CM | POA: Insufficient documentation

## 2015-12-04 DIAGNOSIS — Z7983 Long term (current) use of bisphosphonates: Secondary | ICD-10-CM | POA: Insufficient documentation

## 2015-12-04 DIAGNOSIS — Z0183 Encounter for blood typing: Secondary | ICD-10-CM | POA: Insufficient documentation

## 2015-12-04 LAB — COMPREHENSIVE METABOLIC PANEL
ALT: 24 U/L (ref 14–54)
ANION GAP: 7 (ref 5–15)
AST: 19 U/L (ref 15–41)
Albumin: 4 g/dL (ref 3.5–5.0)
Alkaline Phosphatase: 52 U/L (ref 38–126)
BUN: 15 mg/dL (ref 6–20)
CHLORIDE: 105 mmol/L (ref 101–111)
CO2: 27 mmol/L (ref 22–32)
CREATININE: 0.92 mg/dL (ref 0.44–1.00)
Calcium: 9.2 mg/dL (ref 8.9–10.3)
GFR, EST NON AFRICAN AMERICAN: 58 mL/min — AB (ref >60)
Glucose, Bld: 91 mg/dL (ref 65–99)
POTASSIUM: 4.2 mmol/L (ref 3.5–5.1)
SODIUM: 139 mmol/L (ref 135–145)
Total Bilirubin: 1 mg/dL (ref 0.3–1.2)
Total Protein: 6.4 g/dL — ABNORMAL LOW (ref 6.5–8.1)

## 2015-12-04 LAB — PROTIME-INR
INR: 0.92
PROTHROMBIN TIME: 12.4 s (ref 11.4–15.2)

## 2015-12-04 LAB — SURGICAL PCR SCREEN
MRSA, PCR: NEGATIVE
Staphylococcus aureus: NEGATIVE

## 2015-12-04 LAB — CBC
HCT: 44.1 % (ref 36.0–46.0)
Hemoglobin: 14.5 g/dL (ref 12.0–15.0)
MCH: 31.5 pg (ref 26.0–34.0)
MCHC: 32.9 g/dL (ref 30.0–36.0)
MCV: 95.9 fL (ref 78.0–100.0)
PLATELETS: 339 10*3/uL (ref 150–400)
RBC: 4.6 MIL/uL (ref 3.87–5.11)
RDW: 13.7 % (ref 11.5–15.5)
WBC: 11.6 10*3/uL — ABNORMAL HIGH (ref 4.0–10.5)

## 2015-12-04 LAB — TYPE AND SCREEN
ABO/RH(D): O POS
Antibody Screen: NEGATIVE

## 2015-12-04 LAB — URINALYSIS, ROUTINE W REFLEX MICROSCOPIC
Bilirubin Urine: NEGATIVE
GLUCOSE, UA: NEGATIVE mg/dL
Hgb urine dipstick: NEGATIVE
KETONES UR: NEGATIVE mg/dL
LEUKOCYTES UA: NEGATIVE
Nitrite: NEGATIVE
PROTEIN: NEGATIVE mg/dL
Specific Gravity, Urine: 1.021 (ref 1.005–1.030)
pH: 7 (ref 5.0–8.0)

## 2015-12-04 LAB — APTT: aPTT: 27 seconds (ref 24–36)

## 2015-12-04 NOTE — H&P (Signed)
[name] [name] is an 80 y.o. female.    CHIEF COMPLAINT:  Low back pain, right leg pain.   HISTORY OF PRESENT ILLNESS:  A 80 year old female.  She has had multiple compression fractures of her lumbar and dorsal spine.  She has had MRI scan of her back.  Has been seen by Dr. Hardin Negus, pain management, and Dr. Hardin Negus has performed a right-sided L2 selective nerve root block with steroid and Marcaine.  With the injection, she had significant relief of pain.  Relief was immediate.  Results of her studies have indicated that she does have what appears to be severe right-sided foraminal entrapment at the L2-3 level due to disk space collapse and pedicles approximating one another in addition to hypertrophic changes involving the right-sided facet joint and disk bulge.  Reviewed the study with Darneisha and it is apparent that disk herniation that she had at this segment has been relieved, but the problem now is of foraminal stenosis secondary to disk space collapse.  This, unfortunately, will recur if decompression alone is carried out, as was the case with the last excision of herniated disk.   Past Medical History:  Diagnosis Date  . Anxiety    takes Xanax daily as needed  . Asthma    Albuterol daily as needed  . CAD (coronary artery disease)    LHC 2003 with 40% pLAD, 30% mLAD, 100% D1 (moderate vessel), 100%  D2 (small vessel), 95% mid small OM2.  Pt had PTCA to D1;  D2 and OM2 small vessels and tx medically;  Last Myoview (2012): anterior infarct seen with scar, no ischemia. EF normal. Pt managed medically;  Lexiscan Myoview (12/14):  Low risk; ant scar with very mild peri-infarct ischemia; EF 55% with ant AK   . CAD (coronary artery disease)   . Chronic back pain   . Chronic back pain    HNP  . Chronic nausea    takes Zofran daily as needed  . Constipation    takes Senokot and Miralax daily  . DIVERTICULOSIS, COLON 08/28/2008   Qualifier: Diagnosis of  By: [name], [name], [name]    . GERD  (gastroesophageal reflux disease)    hx of  . Hiatal hernia    hx of  . History of bronchitis several of yrs ago  . History of colon polyps    benign  . History of vertigo    no meds  . Hyperlipidemia    takes Pravastatin daily  . Hypertension    takes Amlodipine,Metoprolol,and Losartan daily  . Osteopenia   . Osteoporosis   . PONV (postoperative nausea and vomiting)    yrs ago  . Slow urinary stream    takes Rapaflo daily  . Stroke ([place]) [place]  . TIA (transient ischemic attack)    no residual effects noted    Past Surgical History:  Procedure Laterality Date  . APPENDECTOMY     patient unsure of date  . BACK SURGERY    . BLADDER REPAIR    . CHOLECYSTECTOMY     Patient unsure of date.  . CORONARY ANGIOPLASTY  11/16/2001   1 stent  . ESOPHAGEAL MANOMETRY N/A 03/25/2013   Procedure: ESOPHAGEAL MANOMETRY (EM);  Surgeon: Inda Castle, MD;  Location: [facility];  Service: Endoscopy;  Laterality: N/A;  . ESOPHAGOGASTRODUODENOSCOPY N/A 03/15/2013   Procedure: ESOPHAGOGASTRODUODENOSCOPY (EGD);  Surgeon: Jerene Bears, MD;  Location: [facility] ENDOSCOPY;  Service: Endoscopy;  Laterality: N/A;  . EYE SURGERY  11/2000  bilateral cataracts with lens implant  . FIXATION KYPHOPLASTY LUMBAR SPINE  X 2   "L3-4"  . HEMORRHOID SURGERY    . HIATAL HERNIA REPAIR  06/03/2011   Procedure: LAPAROSCOPIC REPAIR OF HIATAL HERNIA;  Surgeon: Adin Hector, MD;  Location: [facility];  Service: General;  Laterality: N/A;  . HIATAL HERNIA REPAIR N/A 04/24/2013   Procedure: LAPAROSCOPIC  REPAIR RECURRENT PARASOPHAGEAL HIATAL HERNIA WITH FUNDOPLICATION;  Surgeon: Adin Hector, MD;  Location: [facility];  Service: General;  Laterality: N/A;  . INSERTION OF MESH N/A 04/24/2013   Procedure: INSERTION OF MESH;  Surgeon: Adin Hector, MD;  Location: [facility];  Service: General;  Laterality: N/A;  . LAPAROSCOPIC LYSIS OF ADHESIONS N/A 04/24/2013   Procedure: LAPAROSCOPIC LYSIS OF ADHESIONS;  Surgeon:  Adin Hector, MD;  Location: [facility];  Service: General;  Laterality: N/A;  . LAPAROSCOPIC NISSEN FUNDOPLICATION  2/33/0076   Procedure: LAPAROSCOPIC NISSEN FUNDOPLICATION;  Surgeon: Adin Hector, MD;  Location: [facility];  Service: General;  Laterality: N/A;  . LUMBAR LAMINECTOMY/DECOMPRESSION MICRODISCECTOMY Right 06/26/2012   Procedure: LUMBAR LAMINECTOMY/DECOMPRESSION MICRODISCECTOMY;  Surgeon: Jessy Oto, MD;  Location: [place];  Service: Orthopedics;  Laterality: Right;  RIGHT L4-5 MICRODISCECTOMY  . LUMBAR LAMINECTOMY/DECOMPRESSION MICRODISCECTOMY N/A 05/29/2015   Procedure: RIGHT L2-3 MICRODISCECTOMY;  Surgeon: Jessy Oto, MD;  Location: [place];  Service: Orthopedics;  Laterality: N/A;  . TOTAL ABDOMINAL HYSTERECTOMY  1975   partial    Family History  Problem Relation Age of Onset  . Cancer Mother     colon   . Colon cancer Mother 32  . Heart disease Father 42    heart attack   Social History:  reports that she has never smoked. She has never used smokeless tobacco. She reports that she does not drink alcohol or use drugs.  Allergies:  Allergies  Allergen Reactions  . Ace Inhibitors Cough    Per Dr Melvyn Novas pulmonology 2013  . Aspartame Diarrhea and Nausea And Vomiting  . Atorvastatin Other (See Comments)    Muscle aches  . Biaxin [Clarithromycin] Other (See Comments)    PATIENT CAN TOLERATE Z-PAK.  Is written on patient's paper chart.  [name] [Roflumilast] Nausea And Vomiting  . Erythromycin Other (See Comments)    PATIENT CAN TOLERATE Z-PAK.  It is written on patient's paper chart.  . Hydrocodone Nausea Only  . Levofloxacin Nausea And Vomiting    No prescriptions prior to admission.    Results for orders placed or performed during the hospital encounter of 12/04/15 (from the past 48 hour(s))  Surgical pcr screen     Status: None   Collection Time: 12/04/15  9:01 AM  Result Value Ref Range   MRSA, PCR NEGATIVE NEGATIVE   Staphylococcus aureus NEGATIVE  NEGATIVE    Comment:        The Xpert SA Assay (FDA approved for NASAL specimens in patients over 44 years of age), is one component of a comprehensive surveillance program.  Test performance has been validated by [facility] for patients greater than or equal to 13 year old. It is not intended to diagnose infection nor to guide or monitor treatment.   Urinalysis, Routine w reflex microscopic (not at [facility])     Status: None   Collection Time: 12/04/15  9:01 AM  Result Value Ref Range   Color, Urine YELLOW YELLOW   APPearance CLEAR CLEAR   Specific Gravity, Urine 1.021 1.005 - 1.030   pH 7.0  5.0 - 8.0   Glucose, UA NEGATIVE NEGATIVE mg/dL   Hgb urine dipstick NEGATIVE NEGATIVE   Bilirubin Urine NEGATIVE NEGATIVE   Ketones, ur NEGATIVE NEGATIVE mg/dL   Protein, ur NEGATIVE NEGATIVE mg/dL   Nitrite NEGATIVE NEGATIVE   Leukocytes, UA NEGATIVE NEGATIVE    Comment: MICROSCOPIC NOT DONE ON URINES WITH NEGATIVE PROTEIN, BLOOD, LEUKOCYTES, NITRITE, OR GLUCOSE <1000 mg/dL.  APTT     Status: None   Collection Time: 12/04/15  9:02 AM  Result Value Ref Range   aPTT 27 24 - 36 seconds  CBC     Status: Abnormal   Collection Time: 12/04/15  9:02 AM  Result Value Ref Range   WBC 11.6 (H) 4.0 - 10.5 K/uL   RBC 4.60 3.87 - 5.11 MIL/uL   Hemoglobin 14.5 12.0 - 15.0 g/dL   HCT 44.1 36.0 - 46.0 %   MCV 95.9 78.0 - 100.0 fL   MCH 31.5 26.0 - 34.0 pg   MCHC 32.9 30.0 - 36.0 g/dL   RDW 13.7 11.5 - 15.5 %   Platelets 339 150 - 400 K/uL  Comprehensive metabolic panel     Status: Abnormal   Collection Time: 12/04/15  9:02 AM  Result Value Ref Range   Sodium 139 135 - 145 mmol/L   Potassium 4.2 3.5 - 5.1 mmol/L   Chloride 105 101 - 111 mmol/L   CO2 27 22 - 32 mmol/L   Glucose, Bld 91 65 - 99 mg/dL   BUN 15 6 - 20 mg/dL   Creatinine, Ser 0.92 0.44 - 1.00 mg/dL   Calcium 9.2 8.9 - 10.3 mg/dL   Total Protein 6.4 (L) 6.5 - 8.1 g/dL   Albumin 4.0 3.5 - 5.0 g/dL   AST 19 15 - 41 U/L   ALT 24  14 - 54 U/L   Alkaline Phosphatase 52 38 - 126 U/L   Total Bilirubin 1.0 0.3 - 1.2 mg/dL   GFR calc non Af Amer 58 (L) >60 mL/min   GFR calc Af Amer >60 >60 mL/min    Comment: (NOTE) The eGFR has been calculated using the CKD EPI equation. This calculation has not been validated in all clinical situations. eGFR's persistently <60 mL/min signify possible Chronic Kidney Disease.    Anion gap 7 5 - 15  Protime-INR     Status: None   Collection Time: 12/04/15  9:02 AM  Result Value Ref Range   Prothrombin Time 12.4 11.4 - 15.2 seconds   INR 0.92   Type and screen [place]     Status: None   Collection Time: 12/04/15  9:10 AM  Result Value Ref Range   ABO/RH(D) O POS    Antibody Screen NEG    Sample Expiration 12/07/2015    Dg Chest 2 View  Result Date: 12/04/2015 CLINICAL DATA:  Preoperative evaluation for lumbar fusion. Hypertension. EXAM: CHEST  2 VIEW COMPARISON:  Sep 19, 2014 and August 30, 2014 FINDINGS: There is slight scarring in the left base region laterally. There is no edema or consolidation. Heart size and pulmonary vascularity are normal. No adenopathy. There is atherosclerotic calcification in the aorta. There is anterior wedging of a lower thoracic vertebral body. Patient has had an upper lumbar vertebral body kyphoplasty procedure. IMPRESSION: Mild scarring left base. No edema or consolidation. Stable cardiac silhouette. Aortic atherosclerosis. Electronically Signed   By: Lowella Grip III M.D.   On: 12/04/2015 09:20   Review of Systems  Constitutional: Negative.  HENT: Negative.   Eyes: Negative.   Respiratory: Negative.   Cardiovascular: Negative.   Gastrointestinal: Negative.   Genitourinary: Negative.   Musculoskeletal: Positive for back pain.  Skin: Negative.   Neurological: Positive for tingling.  Endo/Heme/Allergies: Negative.   Psychiatric/Behavioral: Negative.     There were no vitals taken for this visit. Physical Exam   Constitutional: She is oriented to person, place, and time. She appears well-developed.  HENT:  Head: Atraumatic.  Eyes: EOM are normal. Pupils are equal, round, and reactive to light.  Neck: Normal range of motion.  Cardiovascular: Normal rate.   Respiratory: Effort normal. No respiratory distress.  GI: She exhibits no distension.  Neurological: She is alert and oriented to person, place, and time.  Skin: Skin is warm and dry.  Psychiatric: She has a normal mood and affect.      PHYSICAL EXAMINATION:  Her exam shows that there is only minimal weakness in right hip flexion.  Knee extension strength is intact bilaterally.  Foot dorsiflexion/plantarflexion strength is normal.  Hip internal/external rotation is intact.  Her strength is good in sitting position.  Standing and walking are worse.    ASSESSMENT:  This is all consistent with a problem of foraminal entrapment associated with this right side L2-3 level.    PLAN:  As she has tried medications and has tried nerve root blocks and epidural steroid injections as well as exercises, she has pretty much exhausted conservative management for this ongoing discomfort in the right groin, right anterior thigh.  It is apparent with the block that this is an L1 radiculopathy or an L2 radiculopathy.  Because of this, I think she should consider undergoing right-sided L2-3 facetectomy, posterolateral fusion at L2-3 with pedicle screws and rods.  Local bone graft with ViviGen, cancellous chips would also be used.  I do not think I would plan to perform any interbody fusion here as her bone is weak.  I would also recommend the use of cement for cementing impact screws to be placed at the L2-3 level.  The risks of surgery, including risks of infection, bleeding, and neurologic compromise discussed with Berma and her husband and she wishes to go ahead with the intervention.  My plan would be to go ahead and perform the facetectomy after placement of cemented  impact screws.  We would need to have neural monitoring if pedicle screws were to be placed with cement.  The risks of surgery, including risks of infection, bleeding, neurologic compromise, discussed with Nitasha and she wishes to proceed.  I did discuss with Dr. Hardin Negus his findings with the injection treatment and he believes that she did obtain excellent relief with the right L2 nerve root block, a finding consistent with our assessment of her MRI study.  We will go ahead and schedule the intervention and she will continue with her present medications for discomfort.   Lanae Crumbly, PA-C 12/04/2015, 5:11 PM

## 2015-12-04 NOTE — Progress Notes (Signed)
Anesthesia Chart Review: Patient is a 80 year old female scheduled for right L2-3 facetectomy, posterior lateral fusion L2-3 on 12/07/15 by Dr. Louanne Skye. PAT was Friday morning.  History includes non-smoker, post-operative N/V, CAD (LHC 2003 with 40% pLAD, 30% mLAD, 100% D1 (moderate vessel), 100% D2 (small vessel), 95% mid small OM2. Pt had PTCA to D1; D2 and OM2 small vessels and tx medically), HLD, TIA '95, anxiety, osteoporosis, asthma, hypertension, vertigo, chronic back pain, hiatal hernia, GERD, cholecystectomy, hysterectomy, hiatal hernia repair, Nissen fundoplication, bladder repair, multiple back surgeries (last right L2-3 microdiscectomy 05/29/15).  - PCP is Dr. Melford Aase, Seen by Starlyn Skeans, PA 11/25/15 for URI and given Zpack, Duoneb, prednisone. She noted plans for surgery on 12/07/15. - Cardiologist is Dr. Aundra Dubin, last visit 12/10/14. She had had chest pain in 09/2014 with low risk stress test. There was not further chest pain at that time. Six month follow-up planned.  Meds include albuterol, Fosamax, Xanax, amlodipine, Neurontin, Duoneb, losartan, do Lyrica, Lopressor, Norflex, Percocet, Protonix, KCl, pravastatin, prednisone taper (now completed), Rapaflo, Carafate.  EKG 07/07/15 (as part of CPE): SR, LAD, poor r wave progression. Overall, I think tracing is stable when compared to 09/22/14.  Nuclear stress test 09/20/14:   Defect 1: There is a medium defect present in the mid anterior and apical anterior location.  Findings consistent with prior myocardial infarction.  The left ventricular ejection fraction is normal (55-65%).  This is a low risk study.  Echo 09/05/14:  - Left ventricle: The cavity size was normal. Systolic function was normal. The estimated ejection fraction was in the range of 55% to 60%. Wall motion was normal; there were no regional wall motion abnormalities. Doppler parameters are consistent with elevated ventricular end-diastolic filling pressure. - Left  atrium: The atrium was mildly dilated. - Right atrium: The atrium was mildly dilated. - Atrial septum: There was increased thickness of the septum, consistent with lipomatous hypertrophy. No defect or patent foramen ovale was identified. - Pulmonary arteries: PA peak pressure: 35 mm Hg (S).  Cardiac cath 11/16/01:  1. Left ventricular systolic function characterized by moderate akinesis of the anterolateral wall but overall preserved ejection fraction. 2. Small vessel coronary artery disease involving diagonal branches and a small obtuse marginal branch. In review of these findings, the most significant of the diseased small branch, it appears to be the first diagonal which does supply a large portion of the anterolateral wall. S/p successful percutaneous transluminal coronary angioplasty of the first diagonal branch reducing a 100% chronic total occlusion with TIMI-2 flow to 25% residual with TIMI-3 flow.  12/04/15 CXR: IMPRESSION: Mild scarring left base. No edema or consolidation. Stable cardiac silhouette. Aortic atherosclerosis.  Preoperative labs noted. A1c 5.7 on 10/08/15.   Patient with stress and echo just over a year ago. No acute CV symptoms reported at PAT. In regards to recent URI (~ 10 days ago), reportedly (for PAT RN) patient feels recovered. No longer coughing. I was not asked to evaluate patient. Discussed above with anesthesiologist Dr. Linna Caprice. Further evaluation on the day of surgery. If no acute cardiopulmonary issues then would anticipate that she could proceed as planned.  George Hugh [facility] Short Stay Center/Anesthesiology Phone [phone] 12/04/2015 1:24 PM

## 2015-12-06 MED ORDER — CEFAZOLIN SODIUM-DEXTROSE 2-4 GM/100ML-% IV SOLN
2.0000 g | INTRAVENOUS | Status: AC
  Administered 2015-12-07: 2 g via INTRAVENOUS
  Filled 2015-12-06: qty 100

## 2015-12-07 ENCOUNTER — Inpatient Hospital Stay (HOSPITAL_COMMUNITY): Payer: PPO

## 2015-12-07 ENCOUNTER — Inpatient Hospital Stay (HOSPITAL_COMMUNITY): Payer: PPO | Admitting: Vascular Surgery

## 2015-12-07 ENCOUNTER — Encounter (HOSPITAL_COMMUNITY)
Admission: RE | Disposition: A | Discharge status: Home Health | Payer: Self-pay | Source: Ambulatory Visit | Attending: Specialist

## 2015-12-07 ENCOUNTER — Inpatient Hospital Stay (HOSPITAL_COMMUNITY): Payer: PPO | Admitting: Certified Registered"

## 2015-12-07 ENCOUNTER — Inpatient Hospital Stay (HOSPITAL_COMMUNITY)
Admission: RE | Admit: 2015-12-07 | Discharge: 2015-12-11 | DRG: 457 | Disposition: A | Discharge status: Home Health | Payer: PPO | Source: Ambulatory Visit | Attending: Specialist | Admitting: Specialist

## 2015-12-07 DIAGNOSIS — E875 Hyperkalemia: Secondary | ICD-10-CM | POA: Diagnosis not present

## 2015-12-07 DIAGNOSIS — Z419 Encounter for procedure for purposes other than remedying health state, unspecified: Secondary | ICD-10-CM

## 2015-12-07 DIAGNOSIS — R112 Nausea with vomiting, unspecified: Secondary | ICD-10-CM

## 2015-12-07 DIAGNOSIS — M4856XA Collapsed vertebra, not elsewhere classified, lumbar region, initial encounter for fracture: Secondary | ICD-10-CM | POA: Diagnosis not present

## 2015-12-07 DIAGNOSIS — F419 Anxiety disorder, unspecified: Secondary | ICD-10-CM | POA: Diagnosis present

## 2015-12-07 DIAGNOSIS — R1310 Dysphagia, unspecified: Secondary | ICD-10-CM | POA: Diagnosis not present

## 2015-12-07 DIAGNOSIS — J45909 Unspecified asthma, uncomplicated: Secondary | ICD-10-CM | POA: Diagnosis present

## 2015-12-07 DIAGNOSIS — Z8673 Personal history of transient ischemic attack (TIA), and cerebral infarction without residual deficits: Secondary | ICD-10-CM

## 2015-12-07 DIAGNOSIS — M4806 Spinal stenosis, lumbar region: Secondary | ICD-10-CM | POA: Diagnosis not present

## 2015-12-07 DIAGNOSIS — Z955 Presence of coronary angioplasty implant and graft: Secondary | ICD-10-CM

## 2015-12-07 DIAGNOSIS — R058 Other specified cough: Secondary | ICD-10-CM

## 2015-12-07 DIAGNOSIS — K219 Gastro-esophageal reflux disease without esophagitis: Secondary | ICD-10-CM | POA: Diagnosis present

## 2015-12-07 DIAGNOSIS — M4186 Other forms of scoliosis, lumbar region: Secondary | ICD-10-CM | POA: Diagnosis present

## 2015-12-07 DIAGNOSIS — Z981 Arthrodesis status: Secondary | ICD-10-CM | POA: Diagnosis not present

## 2015-12-07 DIAGNOSIS — M5416 Radiculopathy, lumbar region: Secondary | ICD-10-CM | POA: Diagnosis not present

## 2015-12-07 DIAGNOSIS — K567 Ileus, unspecified: Secondary | ICD-10-CM | POA: Diagnosis not present

## 2015-12-07 DIAGNOSIS — I10 Essential (primary) hypertension: Secondary | ICD-10-CM | POA: Diagnosis present

## 2015-12-07 DIAGNOSIS — R7303 Prediabetes: Secondary | ICD-10-CM

## 2015-12-07 DIAGNOSIS — E785 Hyperlipidemia, unspecified: Secondary | ICD-10-CM | POA: Diagnosis not present

## 2015-12-07 DIAGNOSIS — I2510 Atherosclerotic heart disease of native coronary artery without angina pectoris: Secondary | ICD-10-CM | POA: Diagnosis present

## 2015-12-07 DIAGNOSIS — R05 Cough: Secondary | ICD-10-CM

## 2015-12-07 DIAGNOSIS — M810 Age-related osteoporosis without current pathological fracture: Secondary | ICD-10-CM | POA: Diagnosis present

## 2015-12-07 DIAGNOSIS — M549 Dorsalgia, unspecified: Secondary | ICD-10-CM | POA: Diagnosis not present

## 2015-12-07 DIAGNOSIS — R1110 Vomiting, unspecified: Secondary | ICD-10-CM | POA: Diagnosis not present

## 2015-12-07 DIAGNOSIS — J449 Chronic obstructive pulmonary disease, unspecified: Secondary | ICD-10-CM | POA: Diagnosis present

## 2015-12-07 DIAGNOSIS — M48062 Spinal stenosis, lumbar region with neurogenic claudication: Secondary | ICD-10-CM

## 2015-12-07 DIAGNOSIS — M545 Low back pain: Secondary | ICD-10-CM | POA: Diagnosis not present

## 2015-12-07 DIAGNOSIS — E119 Type 2 diabetes mellitus without complications: Secondary | ICD-10-CM | POA: Diagnosis present

## 2015-12-07 HISTORY — DX: Abnormal results of liver function studies: R94.5

## 2015-12-07 HISTORY — PX: LUMBAR FUSION: SHX111

## 2015-12-07 LAB — GLUCOSE, CAPILLARY
GLUCOSE-CAPILLARY: 163 mg/dL — AB (ref 65–99)
GLUCOSE-CAPILLARY: 200 mg/dL — AB (ref 65–99)
Glucose-Capillary: 178 mg/dL — ABNORMAL HIGH (ref 65–99)

## 2015-12-07 SURGERY — POSTERIOR LUMBAR FUSION 1 LEVEL
Anesthesia: General

## 2015-12-07 MED ORDER — DEXAMETHASONE SODIUM PHOSPHATE 10 MG/ML IJ SOLN
INTRAMUSCULAR | Status: DC | PRN
  Administered 2015-12-07: 10 mg via INTRAVENOUS

## 2015-12-07 MED ORDER — BUPIVACAINE LIPOSOME 1.3 % IJ SUSP
INTRAMUSCULAR | Status: DC | PRN
  Administered 2015-12-07: 20 mL

## 2015-12-07 MED ORDER — MIDAZOLAM HCL 5 MG/5ML IJ SOLN
INTRAMUSCULAR | Status: DC | PRN
  Administered 2015-12-07 (×2): 1 mg via INTRAVENOUS

## 2015-12-07 MED ORDER — FENTANYL CITRATE (PF) 100 MCG/2ML IJ SOLN
INTRAMUSCULAR | Status: DC | PRN
  Administered 2015-12-07: 25 ug via INTRAVENOUS
  Administered 2015-12-07: 100 ug via INTRAVENOUS
  Administered 2015-12-07: 25 ug via INTRAVENOUS
  Administered 2015-12-07 (×2): 50 ug via INTRAVENOUS

## 2015-12-07 MED ORDER — LOSARTAN POTASSIUM 50 MG PO TABS
100.0000 mg | ORAL_TABLET | Freq: Every day | ORAL | Status: DC
  Administered 2015-12-10 – 2015-12-11 (×2): 100 mg via ORAL
  Filled 2015-12-07 (×3): qty 2

## 2015-12-07 MED ORDER — ROCURONIUM BROMIDE 100 MG/10ML IV SOLN
INTRAVENOUS | Status: DC | PRN
  Administered 2015-12-07: 10 mg via INTRAVENOUS
  Administered 2015-12-07: 50 mg via INTRAVENOUS
  Administered 2015-12-07: 20 mg via INTRAVENOUS
  Administered 2015-12-07: 10 mg via INTRAVENOUS

## 2015-12-07 MED ORDER — THROMBIN 20000 UNITS EX SOLR
CUTANEOUS | Status: DC | PRN
  Administered 2015-12-07: 20000 [IU] via TOPICAL

## 2015-12-07 MED ORDER — CEFAZOLIN SODIUM 1 G IJ SOLR
INTRAMUSCULAR | Status: AC
  Filled 2015-12-07: qty 20

## 2015-12-07 MED ORDER — SODIUM CHLORIDE 0.9% FLUSH
3.0000 mL | INTRAVENOUS | Status: DC | PRN
  Administered 2015-12-10: 3 mL via INTRAVENOUS
  Filled 2015-12-07: qty 3

## 2015-12-07 MED ORDER — METHOCARBAMOL 500 MG PO TABS
500.0000 mg | ORAL_TABLET | Freq: Four times a day (QID) | ORAL | Status: DC | PRN
  Administered 2015-12-07 – 2015-12-08 (×3): 500 mg via ORAL
  Filled 2015-12-07 (×3): qty 1

## 2015-12-07 MED ORDER — LIDOCAINE HCL (CARDIAC) 20 MG/ML IV SOLN
INTRAVENOUS | Status: DC | PRN
  Administered 2015-12-07: 60 mg via INTRAVENOUS

## 2015-12-07 MED ORDER — GLUCERNA SHAKE PO LIQD
237.0000 mL | Freq: Three times a day (TID) | ORAL | Status: DC
  Administered 2015-12-07 – 2015-12-11 (×7): 237 mL via ORAL

## 2015-12-07 MED ORDER — PANTOPRAZOLE SODIUM 40 MG PO TBEC
40.0000 mg | DELAYED_RELEASE_TABLET | Freq: Every day | ORAL | Status: DC
  Administered 2015-12-08 – 2015-12-11 (×3): 40 mg via ORAL
  Filled 2015-12-07 (×3): qty 1

## 2015-12-07 MED ORDER — OXYCODONE-ACETAMINOPHEN 5-325 MG PO TABS
1.0000 | ORAL_TABLET | ORAL | Status: DC | PRN
  Administered 2015-12-07 – 2015-12-10 (×5): 2 via ORAL
  Filled 2015-12-07 (×5): qty 2

## 2015-12-07 MED ORDER — SENNA 8.6 MG PO TABS
1.0000 | ORAL_TABLET | Freq: Two times a day (BID) | ORAL | Status: DC
  Administered 2015-12-07 – 2015-12-11 (×6): 8.6 mg via ORAL
  Filled 2015-12-07 (×7): qty 1

## 2015-12-07 MED ORDER — CHLORHEXIDINE GLUCONATE 4 % EX LIQD: 60.0000 mL | Freq: Once | CUTANEOUS | Status: DC

## 2015-12-07 MED ORDER — METOPROLOL TARTRATE 50 MG PO TABS
50.0000 mg | ORAL_TABLET | Freq: Two times a day (BID) | ORAL | Status: DC
  Administered 2015-12-08 – 2015-12-11 (×5): 50 mg via ORAL
  Filled 2015-12-07 (×6): qty 1

## 2015-12-07 MED ORDER — PROMETHAZINE HCL 25 MG/ML IJ SOLN
6.2500 mg | INTRAMUSCULAR | Status: AC | PRN
  Administered 2015-12-07 (×2): 6.25 mg via INTRAVENOUS

## 2015-12-07 MED ORDER — METHOCARBAMOL 1000 MG/10ML IJ SOLN
500.0000 mg | Freq: Four times a day (QID) | INTRAVENOUS | Status: DC | PRN
  Filled 2015-12-07: qty 5

## 2015-12-07 MED ORDER — FENTANYL CITRATE (PF) 250 MCG/5ML IJ SOLN
INTRAMUSCULAR | Status: AC
  Filled 2015-12-07: qty 5

## 2015-12-07 MED ORDER — LIDOCAINE 2% (20 MG/ML) 5 ML SYRINGE
INTRAMUSCULAR | Status: AC
  Filled 2015-12-07: qty 5

## 2015-12-07 MED ORDER — POTASSIUM CHLORIDE IN NACL 20-0.9 MEQ/L-% IV SOLN
INTRAVENOUS | Status: DC
  Administered 2015-12-07 – 2015-12-08 (×2): via INTRAVENOUS
  Filled 2015-12-07 (×2): qty 1000

## 2015-12-07 MED ORDER — CEFAZOLIN SODIUM 1 G IJ SOLR
INTRAMUSCULAR | Status: DC | PRN
  Administered 2015-12-07: 2 g

## 2015-12-07 MED ORDER — DEXAMETHASONE SODIUM PHOSPHATE 10 MG/ML IJ SOLN
INTRAMUSCULAR | Status: AC
  Filled 2015-12-07: qty 1

## 2015-12-07 MED ORDER — BUPIVACAINE HCL 0.5 % IJ SOLN
INTRAMUSCULAR | Status: DC | PRN
  Administered 2015-12-07: 20 mL

## 2015-12-07 MED ORDER — PROPOFOL 10 MG/ML IV BOLUS
INTRAVENOUS | Status: DC | PRN
  Administered 2015-12-07: 100 mg via INTRAVENOUS

## 2015-12-07 MED ORDER — POTASSIUM CHLORIDE CRYS ER 20 MEQ PO TBCR
20.0000 meq | EXTENDED_RELEASE_TABLET | Freq: Two times a day (BID) | ORAL | Status: DC
  Administered 2015-12-07 (×2): 20 meq via ORAL
  Filled 2015-12-07 (×2): qty 1

## 2015-12-07 MED ORDER — EPHEDRINE SULFATE 50 MG/ML IJ SOLN
INTRAMUSCULAR | Status: DC | PRN
  Administered 2015-12-07 (×3): 5 mg via INTRAVENOUS

## 2015-12-07 MED ORDER — MENTHOL 3 MG MT LOZG: 1.0000 | LOZENGE | OROMUCOSAL | Status: DC | PRN

## 2015-12-07 MED ORDER — SUGAMMADEX SODIUM 200 MG/2ML IV SOLN
INTRAVENOUS | Status: DC | PRN
  Administered 2015-12-07: 150 mg via INTRAVENOUS

## 2015-12-07 MED ORDER — KETOROLAC TROMETHAMINE 30 MG/ML IJ SOLN
30.0000 mg | Freq: Once | INTRAMUSCULAR | Status: AC
  Administered 2015-12-07: 30 mg via INTRAVENOUS

## 2015-12-07 MED ORDER — SUCRALFATE 1 G PO TABS
1.0000 g | ORAL_TABLET | Freq: Three times a day (TID) | ORAL | Status: DC
  Administered 2015-12-07 – 2015-12-11 (×7): 1 g via ORAL
  Filled 2015-12-07 (×11): qty 1

## 2015-12-07 MED ORDER — GABAPENTIN 100 MG PO CAPS
100.0000 mg | ORAL_CAPSULE | ORAL | Status: DC
  Administered 2015-12-07 – 2015-12-11 (×8): 100 mg via ORAL
  Filled 2015-12-07 (×10): qty 1

## 2015-12-07 MED ORDER — TAMSULOSIN HCL 0.4 MG PO CAPS
0.4000 mg | ORAL_CAPSULE | Freq: Every day | ORAL | Status: DC
  Administered 2015-12-08 – 2015-12-11 (×3): 0.4 mg via ORAL
  Filled 2015-12-07 (×4): qty 1

## 2015-12-07 MED ORDER — IPRATROPIUM-ALBUTEROL 0.5-2.5 (3) MG/3ML IN SOLN: 3.0000 mL | Freq: Four times a day (QID) | RESPIRATORY_TRACT | Status: DC | PRN

## 2015-12-07 MED ORDER — HYDROMORPHONE HCL 1 MG/ML IJ SOLN
0.2500 mg | INTRAMUSCULAR | Status: DC | PRN
  Administered 2015-12-07: 0.5 mg via INTRAVENOUS

## 2015-12-07 MED ORDER — ALBUTEROL SULFATE (2.5 MG/3ML) 0.083% IN NEBU: 3.0000 mL | INHALATION_SOLUTION | Freq: Four times a day (QID) | RESPIRATORY_TRACT | Status: DC | PRN

## 2015-12-07 MED ORDER — PROMETHAZINE HCL 25 MG/ML IJ SOLN
INTRAMUSCULAR | Status: AC
  Administered 2015-12-07: 6.25 mg via INTRAVENOUS
  Filled 2015-12-07: qty 1

## 2015-12-07 MED ORDER — BUPIVACAINE HCL (PF) 0.5 % IJ SOLN
INTRAMUSCULAR | Status: AC
  Filled 2015-12-07: qty 30

## 2015-12-07 MED ORDER — ROCURONIUM BROMIDE 50 MG/5ML IV SOLN
INTRAVENOUS | Status: AC
  Filled 2015-12-07: qty 2

## 2015-12-07 MED ORDER — ONDANSETRON HCL 4 MG/2ML IJ SOLN
INTRAMUSCULAR | Status: AC
  Filled 2015-12-07: qty 4

## 2015-12-07 MED ORDER — DOCUSATE SODIUM 50 MG PO CAPS
250.0000 mg | ORAL_CAPSULE | Freq: Two times a day (BID) | ORAL | Status: DC
  Administered 2015-12-07 – 2015-12-11 (×6): 250 mg via ORAL
  Filled 2015-12-07 (×10): qty 1

## 2015-12-07 MED ORDER — GABAPENTIN 300 MG PO CAPS
300.0000 mg | ORAL_CAPSULE | Freq: Every day | ORAL | Status: DC
  Administered 2015-12-07 – 2015-12-10 (×2): 300 mg via ORAL
  Filled 2015-12-07 (×3): qty 1

## 2015-12-07 MED ORDER — MIDAZOLAM HCL 2 MG/2ML IJ SOLN
INTRAMUSCULAR | Status: AC
  Filled 2015-12-07: qty 2

## 2015-12-07 MED ORDER — 0.9 % SODIUM CHLORIDE (POUR BTL) OPTIME
TOPICAL | Status: DC | PRN
  Administered 2015-12-07 (×2): 1000 mL

## 2015-12-07 MED ORDER — PHENYLEPHRINE HCL 10 MG/ML IJ SOLN
INTRAVENOUS | Status: DC | PRN
  Administered 2015-12-07: 20 ug/min via INTRAVENOUS

## 2015-12-07 MED ORDER — PRAVASTATIN SODIUM 40 MG PO TABS
40.0000 mg | ORAL_TABLET | Freq: Every day | ORAL | Status: DC
  Administered 2015-12-07 – 2015-12-11 (×2): 40 mg via ORAL
  Filled 2015-12-07 (×3): qty 1

## 2015-12-07 MED ORDER — ACETAMINOPHEN 325 MG PO TABS
650.0000 mg | ORAL_TABLET | ORAL | Status: DC | PRN
  Administered 2015-12-09 – 2015-12-11 (×2): 650 mg via ORAL
  Filled 2015-12-07 (×2): qty 2

## 2015-12-07 MED ORDER — BUPIVACAINE LIPOSOME 1.3 % IJ SUSP
20.0000 mL | INTRAMUSCULAR | Status: DC
  Filled 2015-12-07: qty 20

## 2015-12-07 MED ORDER — SODIUM CHLORIDE 0.9% FLUSH
3.0000 mL | Freq: Two times a day (BID) | INTRAVENOUS | Status: DC
  Administered 2015-12-09 – 2015-12-10 (×3): 3 mL via INTRAVENOUS

## 2015-12-07 MED ORDER — HYDROCODONE-ACETAMINOPHEN 5-325 MG PO TABS: 1.0000 | ORAL_TABLET | ORAL | Status: DC | PRN

## 2015-12-07 MED ORDER — HYDROMORPHONE HCL 1 MG/ML IJ SOLN
INTRAMUSCULAR | Status: AC
  Administered 2015-12-07: 0.5 mg via INTRAVENOUS
  Filled 2015-12-07: qty 1

## 2015-12-07 MED ORDER — ONDANSETRON HCL 4 MG/2ML IJ SOLN
4.0000 mg | INTRAMUSCULAR | Status: DC | PRN
  Administered 2015-12-08 – 2015-12-11 (×8): 4 mg via INTRAVENOUS
  Filled 2015-12-07 (×8): qty 2

## 2015-12-07 MED ORDER — PHENYLEPHRINE HCL 10 MG/ML IJ SOLN
INTRAMUSCULAR | Status: DC | PRN
  Administered 2015-12-07 (×3): 40 ug via INTRAVENOUS
  Administered 2015-12-07: 80 ug via INTRAVENOUS

## 2015-12-07 MED ORDER — LACTATED RINGERS IV SOLN
INTRAVENOUS | Status: DC | PRN
  Administered 2015-12-07 (×2): via INTRAVENOUS

## 2015-12-07 MED ORDER — SURGIFOAM 100 EX MISC
CUTANEOUS | Status: DC | PRN
  Administered 2015-12-07: 1 via TOPICAL

## 2015-12-07 MED ORDER — PHENOL 1.4 % MT LIQD: 1.0000 | OROMUCOSAL | Status: DC | PRN

## 2015-12-07 MED ORDER — ACETAMINOPHEN 650 MG RE SUPP: 650.0000 mg | RECTAL | Status: DC | PRN

## 2015-12-07 MED ORDER — SODIUM CHLORIDE 0.9 % IV SOLN
250.0000 mL | INTRAVENOUS | Status: DC
  Administered 2015-12-09: 1000 mL via INTRAVENOUS

## 2015-12-07 MED ORDER — CEFAZOLIN IN D5W 1 GM/50ML IV SOLN
1.0000 g | Freq: Three times a day (TID) | INTRAVENOUS | Status: AC
  Administered 2015-12-07 – 2015-12-08 (×2): 1 g via INTRAVENOUS
  Filled 2015-12-07 (×2): qty 50

## 2015-12-07 MED ORDER — FLEET ENEMA 7-19 GM/118ML RE ENEM
1.0000 | ENEMA | Freq: Once | RECTAL | Status: AC | PRN
  Administered 2015-12-08: 1 via RECTAL
  Filled 2015-12-07: qty 1

## 2015-12-07 MED ORDER — MORPHINE SULFATE (PF) 2 MG/ML IV SOLN
1.0000 mg | INTRAVENOUS | Status: DC | PRN
  Administered 2015-12-07: 2 mg via INTRAVENOUS
  Administered 2015-12-07: 4 mg via INTRAVENOUS
  Filled 2015-12-07: qty 1
  Filled 2015-12-07: qty 2

## 2015-12-07 MED ORDER — INSULIN ASPART 100 UNIT/ML ~~LOC~~ SOLN
0.0000 [IU] | SUBCUTANEOUS | Status: DC
  Administered 2015-12-07 (×3): 3 [IU] via SUBCUTANEOUS
  Administered 2015-12-08 (×2): 2 [IU] via SUBCUTANEOUS

## 2015-12-07 MED ORDER — PROPOFOL 10 MG/ML IV BOLUS
INTRAVENOUS | Status: AC
  Filled 2015-12-07: qty 40

## 2015-12-07 MED ORDER — KETOROLAC TROMETHAMINE 30 MG/ML IJ SOLN
INTRAMUSCULAR | Status: AC
  Administered 2015-12-07: 30 mg via INTRAVENOUS
  Filled 2015-12-07: qty 1

## 2015-12-07 MED ORDER — ONDANSETRON 4 MG PO TBDP
4.0000 mg | ORAL_TABLET | Freq: Three times a day (TID) | ORAL | Status: DC | PRN
  Administered 2015-12-11: 4 mg via ORAL
  Filled 2015-12-07 (×2): qty 1

## 2015-12-07 MED ORDER — ONDANSETRON HCL 4 MG/2ML IJ SOLN
INTRAMUSCULAR | Status: DC | PRN
  Administered 2015-12-07: 4 mg via INTRAVENOUS

## 2015-12-07 MED ORDER — MOMETASONE FURO-FORMOTEROL FUM 200-5 MCG/ACT IN AERO
2.0000 | INHALATION_SPRAY | Freq: Two times a day (BID) | RESPIRATORY_TRACT | Status: DC
  Administered 2015-12-07 – 2015-12-11 (×8): 2 via RESPIRATORY_TRACT
  Filled 2015-12-07: qty 8.8

## 2015-12-07 MED ORDER — ALPRAZOLAM 0.5 MG PO TABS
0.5000 mg | ORAL_TABLET | Freq: Three times a day (TID) | ORAL | Status: DC | PRN
  Administered 2015-12-10: 0.5 mg via ORAL
  Filled 2015-12-07: qty 1

## 2015-12-07 MED ORDER — TOBRAMYCIN 0.3 % OP OINT
TOPICAL_OINTMENT | Freq: Two times a day (BID) | OPHTHALMIC | Status: DC
  Administered 2015-12-07 – 2015-12-08 (×4): via OPHTHALMIC
  Administered 2015-12-09: 1 via OPHTHALMIC
  Administered 2015-12-09 – 2015-12-11 (×4): via OPHTHALMIC
  Filled 2015-12-07: qty 3.5

## 2015-12-07 MED ORDER — HEMOSTATIC AGENTS (NO CHARGE) OPTIME
TOPICAL | Status: DC | PRN
  Administered 2015-12-07: 1 via TOPICAL

## 2015-12-07 MED ORDER — ORPHENADRINE CITRATE ER 100 MG PO TB12
100.0000 mg | ORAL_TABLET | Freq: Two times a day (BID) | ORAL | Status: DC | PRN
  Filled 2015-12-07: qty 1

## 2015-12-07 MED ORDER — PHENYLEPHRINE 40 MCG/ML (10ML) SYRINGE FOR IV PUSH (FOR BLOOD PRESSURE SUPPORT)
PREFILLED_SYRINGE | INTRAVENOUS | Status: AC
  Filled 2015-12-07: qty 10

## 2015-12-07 MED ORDER — THROMBIN 20000 UNITS EX SOLR
CUTANEOUS | Status: AC
  Filled 2015-12-07: qty 20000

## 2015-12-07 MED ORDER — VITAMIN D 1000 UNITS PO TABS
6000.0000 [IU] | ORAL_TABLET | Freq: Every day | ORAL | Status: DC
  Administered 2015-12-07 – 2015-12-11 (×4): 6000 [IU] via ORAL
  Filled 2015-12-07 (×4): qty 6

## 2015-12-07 MED ORDER — POLYETHYLENE GLYCOL 3350 17 G PO PACK
17.0000 g | PACK | Freq: Two times a day (BID) | ORAL | Status: DC
  Administered 2015-12-07 – 2015-12-11 (×5): 17 g via ORAL
  Filled 2015-12-07 (×7): qty 1

## 2015-12-07 MED ORDER — SUGAMMADEX SODIUM 200 MG/2ML IV SOLN
INTRAVENOUS | Status: AC
  Filled 2015-12-07: qty 2

## 2015-12-07 MED ORDER — HYDROMORPHONE HCL 1 MG/ML IJ SOLN
1.0000 mg | INTRAMUSCULAR | Status: DC | PRN
Start: 2015-12-07 — End: 2015-12-09
  Administered 2015-12-08 (×4): 1 mg via INTRAVENOUS
  Filled 2015-12-07 (×4): qty 1

## 2015-12-07 SURGICAL SUPPLY — 83 items
ADH SKN CLS APL DERMABOND .7 (GAUZE/BANDAGES/DRESSINGS) ×1
APL SKNCLS STERI-STRIP NONHPOA (GAUZE/BANDAGES/DRESSINGS) ×1
BENZOIN TINCTURE PRP APPL 2/3 (GAUZE/BANDAGES/DRESSINGS) ×2 IMPLANT
BONE CANC CHIPS 20CC PCAN1/4 (Bone Implant) ×6 IMPLANT
BONE MATRIX VIVIGEN 5CC (Bone Implant) ×3 IMPLANT
BUR MATCHSTICK NEURO 3.0 LAGG (BURR) ×3 IMPLANT
CHIPS CANC BONE 20CC PCAN1/4 (Bone Implant) ×2 IMPLANT
CLOSURE STERI-STRIP 1/2X4 (GAUZE/BANDAGES/DRESSINGS) ×1
CLSR STERI-STRIP ANTIMIC 1/2X4 (GAUZE/BANDAGES/DRESSINGS) ×1 IMPLANT
COVER MAYO STAND STRL (DRAPES) ×6 IMPLANT
COVER SURGICAL LIGHT HANDLE (MISCELLANEOUS) ×3 IMPLANT
DECANTER SPIKE VIAL GLASS SM (MISCELLANEOUS) ×2 IMPLANT
DERMABOND ADVANCED (GAUZE/BANDAGES/DRESSINGS) ×2
DERMABOND ADVANCED .7 DNX12 (GAUZE/BANDAGES/DRESSINGS) ×1 IMPLANT
DRAPE C-ARM 42X72 X-RAY (DRAPES) ×3 IMPLANT
DRAPE C-ARMOR (DRAPES) ×3 IMPLANT
DRAPE POUCH INSTRU U-SHP 10X18 (DRAPES) ×3 IMPLANT
DRAPE SURG 17X23 STRL (DRAPES) ×12 IMPLANT
DRAPE TABLE COVER HEAVY DUTY (DRAPES) ×3 IMPLANT
DRSG MEPILEX BORDER 4X4 (GAUZE/BANDAGES/DRESSINGS) IMPLANT
DRSG MEPILEX BORDER 4X8 (GAUZE/BANDAGES/DRESSINGS) ×4 IMPLANT
DURAPREP 26ML APPLICATOR (WOUND CARE) ×3 IMPLANT
ELECT BLADE 6.5 EXT (BLADE) ×2 IMPLANT
ELECT CAUTERY BLADE 6.4 (BLADE) ×3 IMPLANT
ELECT REM PT RETURN 9FT ADLT (ELECTROSURGICAL) ×3
ELECTRODE REM PT RTRN 9FT ADLT (ELECTROSURGICAL) ×1 IMPLANT
EVACUATOR 1/8 PVC DRAIN (DRAIN) IMPLANT
GAUZE SPONGE 4X4 12PLY STRL (GAUZE/BANDAGES/DRESSINGS) ×3 IMPLANT
GLOVE BIO SURGEON STRL SZ 6.5 (GLOVE) ×2 IMPLANT
GLOVE BIO SURGEON STRL SZ7.5 (GLOVE) ×2 IMPLANT
GLOVE BIO SURGEONS STRL SZ 6.5 (GLOVE) ×2
GLOVE BIOGEL PI IND STRL 7.0 (GLOVE) IMPLANT
GLOVE BIOGEL PI IND STRL 7.5 (GLOVE) IMPLANT
GLOVE BIOGEL PI IND STRL 8 (GLOVE) ×1 IMPLANT
GLOVE BIOGEL PI INDICATOR 7.0 (GLOVE) ×6
GLOVE BIOGEL PI INDICATOR 7.5 (GLOVE) ×2
GLOVE BIOGEL PI INDICATOR 8 (GLOVE) ×2
GLOVE ECLIPSE 9.0 STRL (GLOVE) ×3 IMPLANT
GLOVE ORTHO TXT STRL SZ7.5 (GLOVE) ×3 IMPLANT
GLOVE SURG 8.5 LATEX PF (GLOVE) ×3 IMPLANT
GOWN STRL REUS W/ TWL LRG LVL3 (GOWN DISPOSABLE) ×1 IMPLANT
GOWN STRL REUS W/TWL 2XL LVL3 (GOWN DISPOSABLE) ×6 IMPLANT
GOWN STRL REUS W/TWL LRG LVL3 (GOWN DISPOSABLE) ×3
GRAFT BNE CANC CHIPS 1-8 20CC (Bone Implant) IMPLANT
GRAFT BNE MATRIX VG 5 (Bone Implant) IMPLANT
KIT BASIN OR (CUSTOM PROCEDURE TRAY) ×3 IMPLANT
KIT POSITION SURG JACKSON T1 (MISCELLANEOUS) ×3 IMPLANT
KIT ROOM TURNOVER OR (KITS) ×3 IMPLANT
NDL SPNL 18GX3.5 QUINCKE PK (NEEDLE) ×1 IMPLANT
NEEDLE 22X1 1/2 (OR ONLY) (NEEDLE) ×3 IMPLANT
NEEDLE SPNL 18GX3.5 QUINCKE PK (NEEDLE) ×3 IMPLANT
NS IRRIG 1000ML POUR BTL (IV SOLUTION) ×3 IMPLANT
PACK LAMINECTOMY ORTHO (CUSTOM PROCEDURE TRAY) ×3 IMPLANT
PAD ARMBOARD 7.5X6 YLW CONV (MISCELLANEOUS) ×6 IMPLANT
PATTIES SURGICAL .75X.75 (GAUZE/BANDAGES/DRESSINGS) ×3 IMPLANT
PATTIES SURGICAL 1X1 (DISPOSABLE) ×1 IMPLANT
ROD EXPEDIUM PRE BENT 55MM (Rod) ×4 IMPLANT
SCREW EXPEDIUM POLYAXIAL 6X40 (Screw) ×6 IMPLANT
SCREW EXPEDIUM POLYAXIAL 6X45M (Screw) ×2 IMPLANT
SCREW POLYAXIAL 7X45MM (Screw) ×4 IMPLANT
SCREW SET SINGLE INNER (Screw) ×12 IMPLANT
SPONGE GAUZE 4X4 12PLY STER LF (GAUZE/BANDAGES/DRESSINGS) ×2 IMPLANT
SPONGE LAP 4X18 X RAY DECT (DISPOSABLE) ×2 IMPLANT
SPONGE SURGIFOAM ABS GEL 100 (HEMOSTASIS) ×2 IMPLANT
SURGIFLO W/THROMBIN 8M KIT (HEMOSTASIS) IMPLANT
SUT VIC AB 0 CT1 27 (SUTURE) ×3
SUT VIC AB 0 CT1 27XBRD ANBCTR (SUTURE) ×1 IMPLANT
SUT VIC AB 1 CTX 36 (SUTURE) ×3
SUT VIC AB 1 CTX36XBRD ANBCTR (SUTURE) ×2 IMPLANT
SUT VIC AB 2-0 CT1 27 (SUTURE) ×3
SUT VIC AB 2-0 CT1 TAPERPNT 27 (SUTURE) ×1 IMPLANT
SUT VIC AB 3-0 X1 27 (SUTURE) ×3 IMPLANT
SUT VICRYL 0 CT 1 36IN (SUTURE) ×6 IMPLANT
SYR 20CC LL (SYRINGE) ×3 IMPLANT
SYR CONTROL 10ML LL (SYRINGE) ×6 IMPLANT
SYS SPINAL CEMT CONFIDENCE 11C (Cement) ×3 IMPLANT
SYSTEM SPINAL CEMT CONFDNC 11C (Cement) IMPLANT
TAPE CLOTH SURG 4X10 WHT LF (GAUZE/BANDAGES/DRESSINGS) ×2 IMPLANT
TOWEL OR 17X24 6PK STRL BLUE (TOWEL DISPOSABLE) ×3 IMPLANT
TOWEL OR 17X26 10 PK STRL BLUE (TOWEL DISPOSABLE) ×3 IMPLANT
TRAY FOLEY CATH 16FRSI W/METER (SET/KITS/TRAYS/PACK) ×3 IMPLANT
WATER STERILE IRR 1000ML POUR (IV SOLUTION) ×3 IMPLANT
YANKAUER SUCT BULB TIP NO VENT (SUCTIONS) ×3 IMPLANT

## 2015-12-07 NOTE — Anesthesia Procedure Notes (Signed)
Procedure Name: Intubation Date/Time: 12/07/2015 8:25 AM Performed by: Freddie Breech Pre-anesthesia Checklist: Patient identified, Emergency Drugs available, Suction available and Patient being monitored Patient Re-evaluated:Patient Re-evaluated prior to inductionOxygen Delivery Method: Circle System Utilized Preoxygenation: Pre-oxygenation with 100% oxygen Intubation Type: IV induction Ventilation: Mask ventilation without difficulty and Oral airway inserted - appropriate to patient size Laryngoscope Size: Sabra Heck and 2 Grade View: Grade I Tube type: Oral Tube size: 7.0 mm Number of attempts: 1 Airway Equipment and Method: Stylet and Oral airway Placement Confirmation: ETT inserted through vocal cords under direct vision,  positive ETCO2 and breath sounds checked- equal and bilateral Secured at: 20 cm Tube secured with: Tape Dental Injury: Teeth and Oropharynx as per pre-operative assessment

## 2015-12-07 NOTE — Progress Notes (Signed)
Orthopedic Tech Progress Note Patient Details:  KIZIAH PIN [id number]  Patient ID: [name] [name], female   DOB: 1935-08-22, 80 y.o.   MRN: [id number]   Hildred Priest 12/07/2015, 2:00 PM Called in bio-tech brace orders; spoke with Junie Panning

## 2015-12-07 NOTE — Anesthesia Postprocedure Evaluation (Signed)
Anesthesia Post Note  Patient: [name] [name]  Procedure(s) Performed: Procedure(s) (LRB): Right L2-3 facetectomy, posterolateral fusion L2-3 with pedicle screws, rods, local bone graft, Vivigen cancellous chips (N/A)  Patient location during evaluation: PACU Anesthesia Type: General Level of consciousness: awake and alert Pain management: pain level controlled Vital Signs Assessment: post-procedure vital signs reviewed and stable Respiratory status: spontaneous breathing, nonlabored ventilation, respiratory function stable and patient connected to nasal cannula oxygen Cardiovascular status: blood pressure returned to baseline and stable Postop Assessment: no signs of nausea or vomiting Anesthetic complications: no    Last Vitals:  Vitals:   12/07/15 1155 12/07/15 1225  BP:  121/65  Pulse:  81  Resp:  13  Temp: 37.1 C     Last Pain:  Vitals:   12/07/15 1225  TempSrc:   PainSc: Asleep                 Iniya Matzek S

## 2015-12-07 NOTE — Transfer of Care (Signed)
Immediate Anesthesia Transfer of Care Note  Patient: [name] [name]  Procedure(s) Performed: Procedure(s): Right L2-3 facetectomy, posterolateral fusion L2-3 with pedicle screws, rods, local bone graft, Vivigen cancellous chips (N/A)  Patient Location: PACU  Anesthesia Type:General  Level of Consciousness:  sedated, patient cooperative and responds to stimulation  Airway & Oxygen Therapy:Patient Spontanous Breathing and Patient connected to face mask oxgen  Post-op Assessment:  Report given to PACU RN and Post -op Vital signs reviewed and stable  Post vital signs:  Reviewed and stable  Last Vitals:  Vitals:   12/07/15 0614  BP: (!) 150/53  Pulse: 60  Resp: 18  Temp: [id number] C    Complications: No apparent anesthesia complications

## 2015-12-07 NOTE — Brief Op Note (Signed)
PATIENT ID:      [name] [name]  MRN:     [id number] DOB/AGE:    April 24, 1936 / 80 y.o.       OPERATIVE REPORT   DATE OF PROCEDURE:  12/07/2015      PREOPERATIVE DIAGNOSIS:   Right L2-3 severe foraminal stenosis                                                       Body mass index is 25.06 kg/m.    POSTOPERATIVE DIAGNOSIS:   Right L2-3 severe foraminal stenosis                                                                     Body mass index is 25.06 kg/m.    PROCEDURE:  Procedure(s): Right L2-3 facetectomy, posterolateral fusion L2-3 with pedicle screws, rods, local bone graft, Vivigen cancellous chips Fusion extended to the L1 level with pedicle screws and rods L1 to L3. Posterolateral fusion bilateral L1 to L3 and Posterior fusion left L1-L3.    SURGEON: Mykeisha Dysert E   ASSISTANT: Esaw Grandchild          ANESTHESIA:  General, supplemented with local anethesia marcaine 0.5% 1:1 exparel 1.3% total 20cc.  EBL:350cc  DRAINS:Foley to SD.  COMPONENTS:   Implant Name Type Inv. Item Serial No. Manufacturer Lot No. LRB No. Used  CONFIDENCE high velocity cement Cement    [id number]  1  BONE CANC CHIPS 20CC - [id number] Bone Implant BONE [facility] CHIPS 20CC [id number] [employer] [place]   1  BONE [employer] VIVIGEN 5CC - [id number] Bone Implant BONE [employer] VIVIGEN 5CC [id number] [employer] [place] [employer]   1  SCREW EXPEDIUM POLYAXIAL 6X45M - [id number] Screw SCREW EXPEDIUM POLYAXIAL 6X45M  DEPUY SPINE   1  SCREW EXPEDIUM POLYAXIAL 6X40 - [id number] Screw SCREW EXPEDIUM POLYAXIAL 6X40  DEPU[employer] SPINE   3  SCREW POLYAXIAL 7X45MM - [id number] Screw SCREW POLYAXIAL 7X45MM  DEPUY SPINE   2  ROD EXPEDIUM PRE BENT 55MM - [id number] Rod ROD EXPEDIUM PRE BENT 55MM  DEPUY SPINE   2  SCREW SET SINGLE INNER - [id number] Screw SCREW SET SINGLE INNER  DEPUY SPINE   6  BONE CANC CHIPS 20CC - [id number] Bone Implant BONE CANC CHIPS 20CC [id number] [employer] [place] [employer]     1     COMPLICATIONS:  Right L2 pedicle fracture, required extension of the fusion to the L1 level.  CONDITION:  stable    Esiah Bazinet E 12/07/2015, 11:41 AM

## 2015-12-07 NOTE — Anesthesia Preprocedure Evaluation (Signed)
Anesthesia Evaluation  Patient identified by MRN, date of birth, ID band Patient awake    Reviewed: Allergy & Precautions, NPO status , Patient's Chart, lab work & pertinent test results  Airway Mallampati: II  TM Distance: >3 FB Neck ROM: Full    Dental no notable dental hx.    Pulmonary neg pulmonary ROS,    Pulmonary exam normal breath sounds clear to auscultation       Cardiovascular hypertension, + CAD  Normal cardiovascular exam Rhythm:Regular Rate:Normal     Neuro/Psych Anxiety TIA   GI/Hepatic Neg liver ROS, GERD  ,  Endo/Other  negative endocrine ROS  Renal/GU negative Renal ROS  negative genitourinary   Musculoskeletal negative musculoskeletal ROS (+)   Abdominal   Peds negative pediatric ROS (+)  Hematology negative hematology ROS (+)   Anesthesia Other Findings   Reproductive/Obstetrics negative OB ROS                             Anesthesia Physical Anesthesia Plan  ASA: III  Anesthesia Plan: General   Post-op Pain Management:    Induction: Intravenous  Airway Management Planned: Oral ETT  Additional Equipment:   Intra-op Plan:   Post-operative Plan: Extubation in OR  Informed Consent: I have reviewed the patients History and Physical, chart, labs and discussed the procedure including the risks, benefits and alternatives for the proposed anesthesia with the patient or authorized representative who has indicated his/her understanding and acceptance.   Dental advisory given  Plan Discussed with: CRNA and Surgeon  Anesthesia Plan Comments:         Anesthesia Quick Evaluation

## 2015-12-07 NOTE — Op Note (Signed)
12/07/2015  12:05 PM  PATIENT:  [name] [name]  80 y.o. female  MRN: [id number]  OPERATIVE REPORT  PRE-OPERATIVE DIAGNOSIS:  Right L2-3 severe foraminal stenosis  POST-OPERATIVE DIAGNOSIS:  Right L2-3 severe foraminal stenosis  PROCEDURE:  Procedure(s): Right L2-3 facetectomy, posterolateral fusion L2-3 with pedicle screws, rods, local bone graft, Vivigen cancellous chips. Fusion extended to the L1 level with pedicle screws and rods Posterolateral fusion bilateral from L1 to L3 and left posterior fusion.    SURGEON:  Jessy Oto, MD     ASSISTANT:  Benjiman Core, PA-C  (Present throughout the entire procedure and necessary for completion of procedure in a timely manner)     ANESTHESIA:  General,supplemented with local marcaine 0.5% 1:1 exparel 1.3% total 20CC. Dr. Kalman Shan.    COMPLICATIONS:  Right L2 pedicle fracture, requiring extension of the patient's fusion to the L1 level.     COMPONENTS:  Implant Name Type Inv. Item Serial No. Manufacturer Lot No. LRB No. Used  CONFIDENCE high velocity cement Cement    [id number]  1  BONE CANC CHIPS 20CC - [id number] Bone Implant BONE [facility] [place] CHIPS 20CC [id number] [facility] [place] [facility]   1  BONE MATRIX VIVIGEN 5CC - [id number] Bone Implant BONE [employer]RI[employer] VIVIGEN 5CC [id number] [employer] [place] [facility]   1  SCREW EXPEDIUM POLYAXIAL 6X45M - [id number] Screw SCREW EXPEDIUM POLYAXIAL 6X45M  DEPU[employer] SPINE   1  SCREW EXPEDIUM POLYAXIAL 6X40 - [id number] Screw SCREW EXPEDIUM POLYAXIAL 6X40  DEPU[employer] SPINE   3  SCREW POLYAXIAL 7X45MM - [id number] Screw SCREW POLYAXIAL 7X45MM  DEPUY SPINE   2  ROD EXPEDIUM PRE BENT 55MM - [id number] Rod ROD EXPEDIUM PRE BENT 55MM  DEPUY SPINE   2  SCREW SET SINGLE INNER - [id number] Screw SCREW SET SINGLE INNER  DEPUY SPINE   6  BONE CANC CHIPS 20CC - [id number] Bone Implant BONE CANC CHIPS 20CC [id number] [facility] [place] [facility]     1     PROCEDURE: The patient was met in the holding area, and the  appropriate right L2-3 lumbar level identified and marked with an "X" and my initials. I had discussion with the patient in the preop the fusion levels are reidentified as L2-3. Patient understands the rationale to perform lumbar fusion at the L2-3 to decompress the right L2-3 lateral recess and L2 foramenal stenosis and to allow for stablization of the collapse of the disc at L2-3 associated with the foramenal stenosis. The L1 level has a chronic compression fracture with autofusion to the T12 level.   The patient was then transported to OR and was placed under general anesthetic without difficulty. The patient received appropriate preoperative antibiotic prophylaxis Ancef.  Nursing staff inserted a Foley catheter under sterile conditions. The patient was then turned to a prone position using the [place] spine frame. PAS. all pressure points well padded the arms at the side to 90 90. Standard prep with DuraPrep solution draped in the usual manner from the lower dorsal spine the mid sacral segment. Iodine Vi-Drape was used and the old incision scar was marked. Time-out procedure was called and correct. Skin in the midline between L1 and L3 was then infiltrated with  local marcaine 0.5% 1:1 exparel 1.3% total 20CC used. Incision was then made ellipsing the old incision scar extending from L1-L3through the skin and subcutaneous layers down to the patient's lumbodorsal fascia and spinous processes. The incision then carried sharply excising the supraspinous ligament and then continuing the lateral aspect of  the spinous processes of L1 L2-L3. Cobb elevator used to carefully elevate the paralumbar muscles off of the posterior elements using electrocautery carefully drilled bleeding and perform dissection of the muscle tissues of the preserving the facet at the L1-2 level.After intially attempting placement of the right L2 pedicle screw with an mPACT technique it was quickly determined that the patient's osteoporosis  and previous kyphoplasty/vertebroplasty procedures made visualization of the normal pedicle anatomy unable to be safely visualized With intraoperative C-Arm. Decision was made then to perform standard converging pedicle Screws at the L2 and L3 levels. First by continuing the exposure out laterally to expose the transverse process of L2 and L3 and exposing for eventual posterolateral fusion incision was carried in the midline down to the L1 level area bleeders controlled using electrocautery monopolar electrocautery. Cerebellar retractor was used for the upper and lower part of the incision.   Pedicle screws were then placed at the L2 and L3 levels the through lateral approach identifying the intersection of the transverse process of L2 with the lateral aspect of the pedicle of L2 on the left side an awl used to make an initial entry point into the lateral aspect of the pedicle at L2 and then the gearshift pedicle probe passed to the level of 45 mm on the left side served on C-arm fluoroscopy to be in good position alignment. This was then tapped to 5 mm for a 6 mm x 45 mm screw. Ball-tipped probe used to probe this opening then FloSeal used to obtain hemostasis of bleeding opening into the left L2 pedicle next the ankle for the pedicle screw at the left L3 level was using a similar procedure is in the lateral aspect of the insertion site of the transverse process of L3 into the lateral aspect of the left L3 pedicle an awl was then used to make an initial entry point into the lateral aspect of the pedicle and then a gearshift pedicle probe used to probe the pedicle to a depth of 45 mm observed on C-arm fluoroscopy to be within the pedicle. Screw measuring 7 mm x 45 mm chosen for this left L3 pedicle. The right side was then approached by changing to the right side of the OR table here then pedicle screw openings were then made into the L2 and L3 pedicles. Identifying the intersection of the transverse process of  L2 with the lateral aspect of the pedicle of L2 on the right side an awl used to make an initial entry point into the lateral aspect of the pedicle at L2 and then the gearshift pedicle probe passed to the level of 45 mm on the right side served on C-arm fluoroscopy to be in good position alignment. This was then tapped to 5 mm for a 6 mm x 45 mm screw. Ball-tipped probe used to probe this opening then FloSeal used to obtain hemostasis of bleeding opening into the right L2 pedicle next the ankle for the pedicle screw at the right L3 level was using a similar procedure is in the lateral aspect of the insertion site of the transverse process of L3 into the lateral aspect of the right L3 pedicle an awl was then used to make an initial entry point into the lateral aspect of the pedicle and then a gearshift pedicle probe used to probe the pedicle to a depth of 45 mm observed on C-arm fluoroscopy to be within the pedicle. Screw measuring 7 mm x 45 mm chosen for this right L3  pedicle. Methylmethacrylate cement was then mixed and then using a trocar placed into the pedicle holes left-sided L2 and left side L3 pedicles and then each of the pedicle screws were then placed on the left side at L2 and L3. The 6 mm x 45 mm screw placed on the left at the L2 level and a 7 mm x 45 mm screw placed on the left at the L3 level. Into the right side than again using a trocar cement was introduced in the pedicle screw hole on the right at the L2 level and then on the right side at the L3 level. Pedicle screws were then placed at each of these levels on the right side 40 mm x 6 mm pedicle screw placed on the right side at L3 a 7 mm x 45 mm pedicle screw was placed. Leksell rongeur used to resect inferior aspect of the lamina on the right side at the L2 level. The right medial facet of L2-3 was resected in order to decompress the right side of the lumbar thecal sac at L2-3 and decompress the right L2 and L3 neuroforamen. 60m and 335m kerrisons were used for this portion of the decompression.A large amount of hypertrophic ligmentum flavum was found impressing on the right lateral recesses at L2-3 and narrowing the respective L2 and L3 neuroforamen.The OR microscope was sterilely draped and used during this portion procedure. Following decompression and then attention was turned to performing the placement of rods. First a 35 mm rod was placed between the right side L2 and L3 pedicle screw fasteners and the right L2 and L3 pedicle screw fastener caps were placed. In tightening in the right L2 pedicle screw fastener the rod and fastener were noted to give indicating that the right L2 pedicle fixation was lost. Examination of the pedicle screw showed cement and pedicle screw within bone had fractured near its base with the vertebral body of L2. Then rods were then removed from the right side along with On the L3 level. Similar to placement of the pedicle screws at both L2 and L3 the lateral aspect of the facets at the L12 level were exposed bilaterally along with the transverse processes bleeders controlled using bipolar electrocautery. Entry into the L1 pedicle was made laterally and in awl and then a pedicle probe is positioned and placed pedicle of L1 on the right side this was measured for depth of 40 mm and a 6 mm x 40 mm screw chosen was carried out to 4.35 mm and the 6 mm x 40 mm screw then placed on the right side at L1. and then changing sides similarly and awl is used to make a opening into the pedicle on the left side followed by seating the pedicle to probe the pedicle to about 40 mm then tapping with a 4.35 tap and 40 mm x 6 mm screw then placed on the left side at the L1 level. C-arm fluoroscopy was then brought into the field and using C-arm fluoroscopy then all of the pedicle screws from L1-L3 determined on fluoroscopy to be good position alignment. A 55 mm quarter inch titanium rod precontoured used. This was then placed into the  pedicle screws on the right extending from L1-L3 each of the caps carefully placed loosely tightened. Attention turned to the left side were similarly and then screws were carefully adjusted to allow for a better pattern screws to allow for placement of fixation rod and a quarter inch precontoured titanium rod was then  placed. This was able to be inserted into the left pedicle screw fasteners and then the  caps onto the left fasteners L1-L3The right S1 rod fastener cap was then tightened 85 pounds then on the right side L3 screw fasteners tightening to 85 pounds. Compression then obtained between the right L1 and L2 pedicle screw fasteners and the right L2 To the fastener tightened to 85 pounds. On the left side at L1 to L3 screws were tightened to 85 pounds in their position without compression or distraction. Irrigation was carried out with copious amounts of saline solution this was done throughout the case.Permanent C-arm images were obtained in AP and lateral plane. Remaining local bone graft was then applied along the left and right lateral posterior lateral region extending from L1 through L3 transverse processes to the left L1-L3 posterior fusion mass. Excess Gelfoam was then removed the lumbodorsal musculature carefully exam debrided of any devitalized tissue following removal of Vicryl retractors were the bleeders were controlled using electrocautery and the area dorsal lumbar muscle were then approximated in the midline with interrupted #1 Vicryl sutures loose the dorsal fascia was reattached to the spinous process of L1 to superiorly and L3 inferiorly this was done with #1 Vicryl sutures. Subcutaneous layers then approximated using interrupted 0 Vicryl sutures and 2-0 Vicryl sutures. Skin was closed with a running subcutaneous stitch of 4-0 Vicryl Dermabond was applied then MedPlex bandage. All instrument and sponge counts were correct. The patient was then returned to a supine position on her bed  reactivated extubated and returned to the recovery room in satisfactory condition.   Benjiman Core, PA-C perform the duties of assistant surgeon during this case. He was present from the beginning of the case to the end of the case assisting in transfer the patient from his stretcher to the OR table and back to the stretcher at the end of the case. Assisted in careful retraction and suction of the laminectomy site delicate neural structures operating under the operating room microscope. He performed closure of the incision from the fascia to the skin applying the dressing.           Jelina Paulsen E  12/07/2015, 12:05 PM

## 2015-12-07 NOTE — Interval H&P Note (Signed)
The patient has been re-examined, and the chart reviewed, and there have been no interval changes to the documented history and physical.    The risks, benefits, and alternatives have been discussed at length, and the patient is willing to proceed.   

## 2015-12-07 NOTE — Progress Notes (Signed)
Call from nursing, patient complaining of pain morphine and oral medications are not holding the pain. Ate dinner and is able to pass gas and GI track is functioning.  Will place on dilaudid 1 mg every 2 hours. She is on a continuous O2 sat monitor. Should use Ice packs.

## 2015-12-07 NOTE — Progress Notes (Signed)
Orthopedic Tech Progress Note Patient Details:  [name] [name] Aug 16, 1935 [id number] Brace order completed by [employer] vendor. Patient ID: [name] [name], female   DOB: Feb 11, 1936, 80 y.o.   MRN: [id number]   Braulio Bosch 12/07/2015, 3:22 PM

## 2015-12-07 NOTE — Discharge Instructions (Signed)
° ° °  Call if there is increasing drainage, fever greater than 101.5, severe head aches, and °worsening nausea or light sensitivity. °If shortness of breath, bloody cough or chest tightness or pain go to an emergency room. °No lifting greater than 10 lbs. °Avoid bending, stooping and twisting. °Use brace when sitting and out of bed even to go to bathroom. °Walk in house for first 2 weeks then may start to get out slowly increasing distances up to one quarter mile by 4-6 weeks post op. °After 5 days may shower and change dressing following bathing with shower.When °bathing remove the brace shower and replace brace before getting out of the shower. °If drainage, keep dry dressing and do not bathe the incision, use an moisture impervious dressing. °Please call and return for scheduled follow up appointment 2 weeks from the time of surgery. ° ° °

## 2015-12-08 LAB — HEMOGLOBIN A1C
HEMOGLOBIN A1C: 5.8 % — AB (ref 4.8–5.6)
MEAN PLASMA GLUCOSE: 120 mg/dL

## 2015-12-08 LAB — CBC
HEMATOCRIT: 33.4 % — AB (ref 36.0–46.0)
Hemoglobin: 10.5 g/dL — ABNORMAL LOW (ref 12.0–15.0)
MCH: 31.1 pg (ref 26.0–34.0)
MCHC: 31.4 g/dL (ref 30.0–36.0)
MCV: 98.8 fL (ref 78.0–100.0)
PLATELETS: 230 10*3/uL (ref 150–400)
RBC: 3.38 MIL/uL — ABNORMAL LOW (ref 3.87–5.11)
RDW: 14.2 % (ref 11.5–15.5)
WBC: 18.6 10*3/uL — AB (ref 4.0–10.5)

## 2015-12-08 LAB — BASIC METABOLIC PANEL
Anion gap: 6 (ref 5–15)
BUN: 22 mg/dL — ABNORMAL HIGH (ref 6–20)
CALCIUM: 8.1 mg/dL — AB (ref 8.9–10.3)
CO2: 25 mmol/L (ref 22–32)
CREATININE: 1.09 mg/dL — AB (ref 0.44–1.00)
Chloride: 107 mmol/L (ref 101–111)
GFR, EST AFRICAN AMERICAN: 55 mL/min — AB (ref >60)
GFR, EST NON AFRICAN AMERICAN: 47 mL/min — AB (ref >60)
GLUCOSE: 132 mg/dL — AB (ref 65–99)
Potassium: 5.2 mmol/L — ABNORMAL HIGH (ref 3.5–5.1)
Sodium: 138 mmol/L (ref 135–145)

## 2015-12-08 LAB — GLUCOSE, CAPILLARY
GLUCOSE-CAPILLARY: 102 mg/dL — AB (ref 65–99)
GLUCOSE-CAPILLARY: 109 mg/dL — AB (ref 65–99)
GLUCOSE-CAPILLARY: 130 mg/dL — AB (ref 65–99)
Glucose-Capillary: 139 mg/dL — ABNORMAL HIGH (ref 65–99)
Glucose-Capillary: 114 mg/dL — ABNORMAL HIGH (ref 65–99)

## 2015-12-08 MED ORDER — METOCLOPRAMIDE HCL 5 MG/ML IJ SOLN
5.0000 mg | Freq: Four times a day (QID) | INTRAMUSCULAR | Status: DC | PRN
  Administered 2015-12-08 – 2015-12-10 (×3): 5 mg via INTRAVENOUS
  Filled 2015-12-08 (×4): qty 2

## 2015-12-08 MED ORDER — INSULIN ASPART 100 UNIT/ML ~~LOC~~ SOLN
0.0000 [IU] | Freq: Three times a day (TID) | SUBCUTANEOUS | Status: DC
  Administered 2015-12-10 – 2015-12-11 (×3): 3 [IU] via SUBCUTANEOUS

## 2015-12-08 MED ORDER — SODIUM CHLORIDE 0.9 % IV SOLN
INTRAVENOUS | Status: DC
  Administered 2015-12-08 (×2): via INTRAVENOUS

## 2015-12-08 NOTE — Progress Notes (Signed)
Physical Therapy Treatment Patient Details Name: [name] [name] MRN: [id number] DOB: [birth year] Today's Date: 12/08/2015    History of Present Illness pt presents after L 2-3 Facetectomy and PLIF.  pt with hx of Anxiety, CAD, Chronic Back Pain, Vertigo, HTN, and TIA.      PT Comments    Returned to see pt for a 2nd session as [employer] brought a larger brace.  [employer] present during most of session and discussed need to alter larger brace to allow a better fit for pt.  Will continue to follow.    Follow Up Recommendations  Home health PT;Supervision/Assistance - 24 hour     Equipment Recommendations  None recommended by PT    Recommendations for Other Services       Precautions / Restrictions Precautions Precautions: Fall;Back Precaution Booklet Issued: No Precaution Comments: Initiated education, but pt very painful and nauseated, so will need further education.   Required Braces or Orthoses: Spinal Brace Spinal Brace: Thoracolumbosacral orthotic;Applied in sitting position Restrictions Weight Bearing Restrictions: No    Mobility  Bed Mobility Overal bed mobility: Needs Assistance Bed Mobility: Rolling;Sidelying to Sit;Sit to Sidelying Rolling: Min guard Sidelying to sit: Mod assist     Sit to sidelying: Mod assist General bed mobility comments: pt with good recall from earlier session on log roll technique.  pt needed increased A to bring trunk to sitting and hips to EOB due to increased pain.    Transfers Overall transfer level: Needs assistance Equipment used: Rolling walker (2 wheeled) Transfers: Sit to/from Stand Sit to Stand: Min assist         General transfer comment: cues for UE use.  pt with strong anterior lean to bring self over BOS.    Ambulation/Gait Ambulation/Gait assistance: Min assist Ambulation Distance (Feet): 12 Feet (x2) Assistive device: Rolling walker (2 wheeled) Gait Pattern/deviations: Step-through pattern;Decreased stride length      General Gait Details: pt able to ambulate to bathroom and back.  cues for positioning within RW and more upright posture.     Stairs            Wheelchair Mobility    Modified Rankin (Stroke Patients Only)       Balance Overall balance assessment: Needs assistance Sitting-balance support: Bilateral upper extremity supported;Feet supported Sitting balance-Leahy Scale: Poor     Standing balance support: Bilateral upper extremity supported;During functional activity Standing balance-Leahy Scale: Poor                      Cognition Arousal/Alertness: Awake/alert Behavior During Therapy: WFL for tasks assessed/performed Overall Cognitive Status: Within Functional Limits for tasks assessed                      Exercises      General Comments        Pertinent Vitals/Pain Pain Assessment: 0-10 Pain Score: 9  Pain Location: Back Pain Descriptors / Indicators: Sore Pain Intervention(s): Monitored during session;Premedicated before session;Repositioned    Home Living Family/patient expects to be discharged to:: Private residence Living Arrangements: Spouse/significant other Available Help at Discharge: Family;Available 24 hours/day Type of Home: House Home Access: Stairs to enter Entrance Stairs-Rails:  (pt states husband is building rails.) Home Layout: One level Home Equipment: Environmental consultant - 2 wheels;Crutches;Bedside commode;Shower seat      Prior Function Level of Independence: Needs assistance    ADL's / Homemaking Assistance Needed: A with homemaking tasks only.       PT  Goals (current goals can now be found in the care plan section) Acute Rehab PT Goals Patient Stated Goal: Be home with husband. PT Goal Formulation: With patient Time For Goal Achievement: 12/15/15 Potential to Achieve Goals: Good Progress towards PT goals: Progressing toward goals    Frequency  Min 5X/week    PT Plan Current plan remains appropriate     Co-evaluation             End of Session Equipment Utilized During Treatment: Gait belt;Back brace Activity Tolerance: Patient limited by pain Patient left: in bed;with call bell/phone within reach     Time: [id number] PT Time Calculation (min) (ACUTE ONLY): 15 min  Charges:  $Gait Training: 8-22 mins $Therapeutic Activity: 8-22 mins                    G CodesCatarina Hartshorn, [place] 12/08/2015, 12:20 PM

## 2015-12-08 NOTE — Progress Notes (Signed)
Subjective: Doing well.  Better pain control with Dilaudid IV.  preop leg pain gone.    Objective: Vital signs in last 24 hours: Temp:  [97.7 F (36.5 C)-99.1 F (37.3 C)] 98.6 F (37 C) (08/01 0500) Pulse Rate:  [76-85] 76 (08/01 0500) Resp:  [13-18] 18 (08/01 0500) BP: (104-121)/(54-65) 107/65 (08/01 0500) SpO2:  [98 %-99 %] 98 % (08/01 0500)  Intake/Output from previous day: 07/31 0701 - 08/01 0700 In: 1400 [I.V.:1400] Out: 1550 [Urine:1150; Blood:400] Intake/Output this shift: No intake/output data recorded.   Recent Labs  12/08/15 0531  HGB 10.5*    Recent Labs  12/08/15 0531  WBC 18.6*  RBC 3.38*  HCT 33.4*  PLT 230    Recent Labs  12/08/15 0531  NA 138  K 5.2*  CL 107  CO2 25  BUN 22*  CREATININE 1.09*  GLUCOSE 132*  CALCIUM 8.1*   No results for input(s): LABPT, INR in the last 72 hours.  Exam: alert and oriented.  NAD.  NVI.  No focal motor deficits.    Assessment/Plan: PT today.   Hyperkalemia.  Will d/c potassium in IV and oral supplements.     Kandon Hosking M 12/08/2015, 7:53 AM

## 2015-12-08 NOTE — Evaluation (Signed)
Occupational Therapy Evaluation Patient Details Name: [name] [name] MRN: [id number] DOB: February 11, 1936 Today's Date: 12/08/2015    History of Present Illness pt presents after L 2-3 Facetectomy and PLIF.  pt with hx of Anxiety, CAD, Chronic Back Pain, Vertigo, HTN, and TIA.     Clinical Impression   Pt in significant pain despite pain medication administered during session. OT provided education to Pt and husband through demonstration, and handout. OT donned Pt's brace in sitting with husband watching, OT explaining technique. Pt with decreased independence in ADL, decreased safety awareness maintaining precautions, and See OT deficit list below. Pt will benefit from OT in the acute care setting to increase independence and safety during ADL as well as provide education to family for additional support.    Follow Up Recommendations  Home health OT;Supervision/Assistance - 24 hour    Equipment Recommendations  None recommended by OT    Recommendations for Other Services       Precautions / Restrictions Precautions Precautions: Fall;Back Precaution Booklet Issued: Yes (comment) Precaution Comments: Pt provided with back handout (BAT) precautions Required Braces or Orthoses: Spinal Brace Spinal Brace: Thoracolumbosacral orthotic;Applied in sitting position Restrictions Weight Bearing Restrictions: No      Mobility Bed Mobility Overal bed mobility: Needs Assistance Bed Mobility: Rolling;Sidelying to Sit;Sit to Sidelying Rolling: Min assist Sidelying to sit: Mod assist     Sit to sidelying: Mod assist General bed mobility comments: pt with good recall from earlier PT session on log roll technique.  pt needed increased A to bring trunk to sitting and hips to EOB due to increased pain.    Transfers Overall transfer level: Needs assistance Equipment used: Rolling walker (2 wheeled) Transfers: Sit to/from Stand Sit to Stand: Min assist         General transfer comment: cues  for UE use.  pt with strong anterior lean to bring self over BOS.      Balance Overall balance assessment: Needs assistance Sitting-balance support: Bilateral upper extremity supported Sitting balance-Leahy Scale: Poor     Standing balance support: Bilateral upper extremity supported Standing balance-Leahy Scale: Poor                              ADL Overall ADL's : Needs assistance/impaired Eating/Feeding: Sitting;Supervision/ safety Eating/Feeding Details (indicate cue type and reason): vc for back precautions during lunch Grooming: Wash/dry face;Min guard;With caregiver independent assisting;Sitting (Pt sitting in chair, and able to perform grooming tasks)                   Toilet Transfer: Minimal assistance;Ambulation;BSC Toilet Transfer Details (indicate cue type and reason): simulated toilet transfer to chair. Pt limited by pain.          Functional mobility during ADLs: Min guard;Rolling walker General ADL Comments: Pt max assist for don of brace. OT provided education to Pt and husband. Pt also provided with back precautions handout and discussed functional implications during ADL and IADL.     Vision     Perception     Praxis      Pertinent Vitals/Pain Pain Assessment: 0-10 Pain Score: 9  Pain Location: Back  Pain Descriptors / Indicators: Sore;Sharp;Operative site guarding Pain Intervention(s): Limited activity within patient's tolerance;Monitored during session;Repositioned;RN gave pain meds during session     Hand Dominance Right   Extremity/Trunk Assessment Upper Extremity Assessment Upper Extremity Assessment: Overall WFL for tasks assessed   Lower Extremity Assessment Lower Extremity  Assessment: Defer to PT evaluation       Communication Communication Communication: 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   Cognition Arousal/Alertness: Awake/alert Behavior During Therapy: WFL for tasks assessed/performed Overall Cognitive Status: Within  Functional Limits for tasks assessed                     General Comments       Exercises       Shoulder Instructions      Home Living Family/patient expects to be discharged to:: Private residence Living Arrangements: Spouse/significant other Available Help at Discharge: Family;Available 24 hours/day Type of Home: House Home Access: Stairs to enter [employer] of Steps: 3 Entrance Stairs-Rails: None (husband is building) Home Layout: One level     Bathroom Shower/Tub: Occupational psychologist: Handicapped height Bathroom Accessibility: Yes How Accessible: Accessible via walker Home Equipment: Walker - 2 wheels;Crutches;Bedside commode;Shower seat          Prior Functioning/Environment Level of Independence: Needs assistance    ADL's / Homemaking Assistance Needed: A with homemaking tasks only.          OT Diagnosis: Generalized weakness;Acute pain   OT Problem List: Decreased strength;Decreased activity tolerance;Impaired balance (sitting and/or standing);Decreased safety awareness;Decreased knowledge of use of DME or AE;Decreased knowledge of precautions;Pain   OT Treatment/Interventions: Self-care/ADL training;DME and/or AE instruction;Energy conservation;Therapeutic activities;Patient/family education    OT Goals(Current goals can be found in the care plan section) Acute Rehab OT Goals Patient Stated Goal: Be home with husband. OT Goal Formulation: With patient Time For Goal Achievement: 12/15/15 Potential to Achieve Goals: Good ADL Goals Pt Will Perform Upper Body Dressing: with supervision;with caregiver independent in assisting;sitting Pt Will Perform Lower Body Dressing: with min assist;with caregiver independent in assisting;sit to/from stand Pt Will Transfer to Toilet: with supervision;ambulating;bedside commode Pt Will Perform Toileting - Clothing Manipulation and hygiene: with min assist;with caregiver independent in  assisting;sit to/from stand Pt Will Perform Tub/Shower Transfer: with min assist;with caregiver independent in assisting;shower seat;ambulating;rolling walker Additional ADL Goal #1: Pt will recall 3 out of 3 back precautions. Additional ADL Goal #2: Pt will don/doff back brace with max assistance with caregiver independent in assisting.  OT Frequency: Min 3X/week   Barriers to D/C:            Co-evaluation              End of Session Equipment Utilized During Treatment: Gait belt;Rolling walker;Back brace Nurse Communication: Mobility status  Activity Tolerance: Patient limited by pain Patient left: in chair;with call bell/phone within reach;with family/visitor present   Time: [id number] OT Time Calculation (min): 51 min Charges:  OT General Charges $OT Visit: 1 Procedure OT Evaluation $OT Eval Low Complexity: 1 Procedure OT Treatments $Self Care/Home Management : 23-37 mins G-Codes:    Merri Ray Channel Papandrea OTR/L 12/08/2015, 3:01 PM [phone]

## 2015-12-08 NOTE — Clinical Social Work Note (Signed)
CSW acknowledges SNF consult. PT recommending HHPT.  CSW signing off. Consult again if any social work needs arise.  Zandon Talton, CSW [phone]  

## 2015-12-08 NOTE — Care Management Important Message (Signed)
Important Message  Patient Details  Name: [name] [name] MRN: [id number] Date of Birth: [birth year]   Medicare Important Message Given:  Yes    [name] 12/08/2015, 8:15 AM

## 2015-12-08 NOTE — Evaluation (Signed)
Physical Therapy Evaluation Patient Details Name: KYMONI GRIEBEL MRN: [id number] DOB: 1936-02-09 Today's Date: 12/08/2015   History of Present Illness  pt presents after L 2-3 Facetectomy and PLIF.  pt with hx of Anxiety, CAD, Chronic Back Pain, Vertigo, HTN, and TIA.    Clinical Impression  Pt very painful, but agreeable to PT and mobility.  While sitting EOB attempted to don TLSO, however brace appears to be too small for pt as bottom velcro strap was only overlapped 1 inch on pt's R and still had almost 3 inches before velcro could touch on L side.  RN made aware of inability to don brace and placed a call to [employer].  Will hold OOB mobility until pt is able to don brace or MD clarifies if pt is allowed to be OOB without brace.  Will continue to follow.      Follow Up Recommendations Home health PT;Supervision/Assistance - 24 hour    Equipment Recommendations  None recommended by PT    Recommendations for Other Services       Precautions / Restrictions Precautions Precautions: Fall;Back Precaution Booklet Issued: No Precaution Comments: Initiated education, but pt very painful and nauseated, so will need further education.   Required Braces or Orthoses: Spinal Brace Spinal Brace: Thoracolumbosacral orthotic;Applied in sitting position Restrictions Weight Bearing Restrictions: No      Mobility  Bed Mobility Overal bed mobility: Needs Assistance Bed Mobility: Rolling;Sidelying to Sit;Sit to Sidelying Rolling: Min assist Sidelying to sit: Min assist;HOB elevated     Sit to sidelying: Mod assist General bed mobility comments: pt does follow cues for log rolling, but needs A throughout bed mobility.    Transfers                 General transfer comment: Unable to don brace, so OOB deferred at this time.  RN aware.  Ambulation/Gait                Stairs            Wheelchair Mobility    Modified Rankin (Stroke Patients Only)       Balance  Overall balance assessment: Needs assistance Sitting-balance support: Bilateral upper extremity supported;Feet supported Sitting balance-Leahy Scale: Poor                                       Pertinent Vitals/Pain Pain Assessment: 0-10 Pain Score: 7  Pain Location: Back Pain Descriptors / Indicators: Sore;Pressure Pain Intervention(s): Monitored during session;Repositioned;RN gave pain meds during session    Home Living Family/patient expects to be discharged to:: Private residence Living Arrangements: Spouse/significant other Available Help at Discharge: Family;Available 24 hours/day Type of Home: House Home Access: Stairs to enter Entrance Stairs-Rails:  (pt states husband is building rails.) Entrance Stairs-Number of Steps: 3 (deep stairs) Home Layout: One level Home Equipment: Walker - 2 wheels;Crutches;Bedside commode;Shower seat      Prior Function Level of Independence: Needs assistance      ADL's / Homemaking Assistance Needed: A with homemaking tasks only.          Hand Dominance   Dominant Hand: Right    Extremity/Trunk Assessment   Upper Extremity Assessment: Defer to OT evaluation           Lower Extremity Assessment: Generalized weakness      Cervical / Trunk Assessment: Normal  Communication   Communication: 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  Cognition  Arousal/Alertness: Awake/alert Behavior During Therapy: WFL for tasks assessed/performed Overall Cognitive Status: Within Functional Limits for tasks assessed                      General Comments      Exercises        Assessment/Plan    PT Assessment Patient needs continued PT services  PT Diagnosis Difficulty walking;Generalized weakness;Acute pain   PT Problem List Decreased strength;Decreased activity tolerance;Decreased balance;Decreased mobility;Decreased knowledge of use of DME;Decreased knowledge of precautions;Pain  PT Treatment Interventions DME instruction;Gait  training;Stair training;Functional mobility training;Therapeutic activities;Therapeutic exercise;Balance training;Patient/family education   PT Goals (Current goals can be found in the Care Plan section) Acute Rehab PT Goals Patient Stated Goal: Be home with husband. PT Goal Formulation: With patient Time For Goal Achievement: 12/15/15 Potential to Achieve Goals: Good    Frequency Min 5X/week   Barriers to discharge        Co-evaluation               End of Session Equipment Utilized During Treatment: Back brace Activity Tolerance: Patient limited by pain (pt nauseated.) Patient left: in bed;with call bell/phone within reach;with nursing/sitter in room Nurse Communication: Mobility status         Time: [id number] PT Time Calculation (min) (ACUTE ONLY): 33 min   Charges:   PT Evaluation $PT Eval Moderate Complexity: 1 Procedure PT Treatments $Therapeutic Activity: 8-22 mins   PT G CodesCatarina Hartshorn, [place] [phone] 12/08/2015, 9:33 AM

## 2015-12-09 ENCOUNTER — Inpatient Hospital Stay (HOSPITAL_COMMUNITY): Payer: PPO

## 2015-12-09 ENCOUNTER — Encounter (HOSPITAL_COMMUNITY): Payer: Self-pay | Admitting: Physician Assistant

## 2015-12-09 DIAGNOSIS — M4186 Other forms of scoliosis, lumbar region: Secondary | ICD-10-CM | POA: Diagnosis not present

## 2015-12-09 DIAGNOSIS — K567 Ileus, unspecified: Secondary | ICD-10-CM | POA: Diagnosis not present

## 2015-12-09 DIAGNOSIS — R112 Nausea with vomiting, unspecified: Secondary | ICD-10-CM | POA: Diagnosis not present

## 2015-12-09 DIAGNOSIS — R05 Cough: Secondary | ICD-10-CM | POA: Diagnosis not present

## 2015-12-09 LAB — CBC WITH DIFFERENTIAL/PLATELET
Basophils Absolute: 0 10*3/uL (ref 0.0–0.1)
Basophils Relative: 0 %
EOS ABS: 0 10*3/uL (ref 0.0–0.7)
EOS PCT: 0 %
HCT: 34.2 % — ABNORMAL LOW (ref 36.0–46.0)
Hemoglobin: 11.2 g/dL — ABNORMAL LOW (ref 12.0–15.0)
LYMPHS ABS: 0.9 10*3/uL (ref 0.7–4.0)
Lymphocytes Relative: 6 %
MCH: 31.4 pg (ref 26.0–34.0)
MCHC: 32.7 g/dL (ref 30.0–36.0)
MCV: 95.8 fL (ref 78.0–100.0)
Monocytes Absolute: 1.8 10*3/uL — ABNORMAL HIGH (ref 0.1–1.0)
Monocytes Relative: 11 %
Neutro Abs: 13.1 10*3/uL — ABNORMAL HIGH (ref 1.7–7.7)
Neutrophils Relative %: 83 %
PLATELETS: 208 10*3/uL (ref 150–400)
RBC: 3.57 MIL/uL — AB (ref 3.87–5.11)
RDW: 13.5 % (ref 11.5–15.5)
WBC: 15.8 10*3/uL — AB (ref 4.0–10.5)

## 2015-12-09 LAB — URINALYSIS, ROUTINE W REFLEX MICROSCOPIC
BILIRUBIN URINE: NEGATIVE
Glucose, UA: NEGATIVE mg/dL
Hgb urine dipstick: NEGATIVE
KETONES UR: 15 mg/dL — AB
Leukocytes, UA: NEGATIVE
NITRITE: NEGATIVE
Protein, ur: NEGATIVE mg/dL
SPECIFIC GRAVITY, URINE: 1.016 (ref 1.005–1.030)
pH: 7.5 (ref 5.0–8.0)

## 2015-12-09 LAB — COMPREHENSIVE METABOLIC PANEL
ALBUMIN: 3.2 g/dL — AB (ref 3.5–5.0)
ALT: 43 U/L (ref 14–54)
ANION GAP: 10 (ref 5–15)
AST: 94 U/L — ABNORMAL HIGH (ref 15–41)
Alkaline Phosphatase: 55 U/L (ref 38–126)
BILIRUBIN TOTAL: 0.9 mg/dL (ref 0.3–1.2)
BUN: 10 mg/dL (ref 6–20)
CHLORIDE: 99 mmol/L — AB (ref 101–111)
CO2: 21 mmol/L — ABNORMAL LOW (ref 22–32)
Calcium: 7.9 mg/dL — ABNORMAL LOW (ref 8.9–10.3)
Creatinine, Ser: 0.69 mg/dL (ref 0.44–1.00)
GFR calc Af Amer: >60 mL/min (ref >60)
GFR calc non Af Amer: >60 mL/min (ref >60)
GLUCOSE: 115 mg/dL — AB (ref 65–99)
POTASSIUM: 3.6 mmol/L (ref 3.5–5.1)
SODIUM: 130 mmol/L — AB (ref 135–145)
TOTAL PROTEIN: 6.2 g/dL — AB (ref 6.5–8.1)

## 2015-12-09 LAB — BASIC METABOLIC PANEL
ANION GAP: 8 (ref 5–15)
BUN: 12 mg/dL (ref 6–20)
CALCIUM: 7.5 mg/dL — AB (ref 8.9–10.3)
CO2: 23 mmol/L (ref 22–32)
CREATININE: 0.77 mg/dL (ref 0.44–1.00)
Chloride: 101 mmol/L (ref 101–111)
GFR calc Af Amer: >60 mL/min (ref >60)
GLUCOSE: 119 mg/dL — AB (ref 65–99)
Potassium: 3.8 mmol/L (ref 3.5–5.1)
Sodium: 132 mmol/L — ABNORMAL LOW (ref 135–145)

## 2015-12-09 LAB — GLUCOSE, CAPILLARY
GLUCOSE-CAPILLARY: 105 mg/dL — AB (ref 65–99)
GLUCOSE-CAPILLARY: 117 mg/dL — AB (ref 65–99)
Glucose-Capillary: 105 mg/dL — ABNORMAL HIGH (ref 65–99)
Glucose-Capillary: 128 mg/dL — ABNORMAL HIGH (ref 65–99)

## 2015-12-09 LAB — LIPASE, BLOOD: Lipase: 15 U/L (ref 11–51)

## 2015-12-09 LAB — AMYLASE: Amylase: 41 U/L (ref 28–100)

## 2015-12-09 LAB — CORTISOL: CORTISOL PLASMA: 28.1 ug/dL

## 2015-12-09 MED ORDER — POTASSIUM CHLORIDE IN NACL 20-0.9 MEQ/L-% IV SOLN: INTRAVENOUS | Status: DC

## 2015-12-09 MED ORDER — HYDROMORPHONE HCL 1 MG/ML IJ SOLN
1.0000 mg | INTRAMUSCULAR | Status: DC | PRN
  Administered 2015-12-09 – 2015-12-10 (×7): 1 mg via INTRAVENOUS
  Filled 2015-12-09 (×7): qty 1

## 2015-12-09 MED ORDER — OXYCODONE HCL ER 10 MG PO T12A
10.0000 mg | EXTENDED_RELEASE_TABLET | Freq: Two times a day (BID) | ORAL | Status: DC
  Administered 2015-12-10 – 2015-12-11 (×3): 10 mg via ORAL
  Filled 2015-12-09 (×4): qty 1

## 2015-12-09 MED ORDER — PROMETHAZINE HCL 25 MG/ML IJ SOLN
12.5000 mg | Freq: Four times a day (QID) | INTRAMUSCULAR | Status: DC | PRN
  Administered 2015-12-09 (×2): 12.5 mg via INTRAVENOUS
  Filled 2015-12-09 (×2): qty 1

## 2015-12-09 NOTE — Consult Note (Signed)
Referring Provider: No ref. provider found Primary Care Physician:  Alesia Richards, MD Primary Gastroenterologist:  Dr.  Luiz Iron for Consultation: Intractable nausea post-op spinal decompression.  HPI: [name] [name] is a 80 y.o. female with a history of GERD, Esophageal strictures with dilation,  hiatal hernia s/p 2 fundoplications ([id number]), chronic nausea, spinal stenosis having fusion on 12/07/15.  EGD 2014 for nausea, vomiting, and dysphagia White exudate consistent with candidiasis Slipped Nissen fundoplication  She had spinal fusion 3 days ago, having no trouble initially with nausea or appetite . Today she has nausea with retching despite Zofran or Reglan. Phenergan is ordered but has not tried medication yet. She denies any episodes of vomiting during this time. Has some dysphagia with medications and food prior to admission. However, she has had some degree of dysphagia since second fundoplication. On Protonix daily for GERD.  She endorses that she was given an enema yesterday having one "good" bowel movement with no blood or melena yesterday. Continues on home doses Miralax and Senokot for history of constipation.    XR abdomen 12/09/15 Mildly distended loops of small and large bowel favored to represent ileus. Moderate stool in the cecum.  Question ileus.  No visible signs of bowel obstruction.  CXR 12/09/15 Cardiac shadow is stable. The lungs are well aerated bilaterally. No focal infiltrate or sizable effusion is seen. No acute bony abnormality is noted.  Past Medical History:  Diagnosis Date  . Anxiety    takes Xanax daily as needed  . Asthma    Albuterol daily as needed  . CAD (coronary artery disease)    LHC 2003 with 40% pLAD, 30% mLAD, 100% D1 (moderate vessel), 100%  D2 (small vessel), 95% mid small OM2.  Pt had PTCA to D1;  D2 and OM2 small vessels and tx medically;  Last Myoview (2012): anterior infarct seen with scar, no ischemia. EF normal. Pt managed  medically;  Lexiscan Myoview (12/14):  Low risk; ant scar with very mild peri-infarct ischemia; EF 55% with ant AK   . Chronic back pain    HNP, DDD, spinal stenosis, vertebral compression fractures.   . Chronic nausea    takes Zofran daily as needed.  normal gastric emptying study 03/2013  . Constipation    felt to be functional. takes Senokot and Miralax daily.  treated with Linzess in past.   . DIVERTICULOSIS, COLON 08/28/2008   Qualifier: Diagnosis of  By: [name], [name], [name]    . Elevated LFTs 2015   probably med induced DILI, from Augmentin vs Pravastatin  . GERD (gastroesophageal reflux disease)    hx of esophageal stricture   . Hiatal hernia    Paraesophageal hernias. s/p fundoplications in 0000000 and 04/2013  . History of bronchitis several of yrs ago  . History of colon polyps 03/2009, 03/2011   adenomatous colon polyps, no high grade dysplasia.   [name] History of vertigo    no meds  . Hyperlipidemia    takes Pravastatin daily  . Hypertension    takes Amlodipine,Metoprolol,and Losartan daily  . Osteoporosis   . PONV (postoperative nausea and vomiting)    yrs ago  . Slow urinary stream    takes Rapaflo daily  . TIA (transient ischemic attack) 1995   no residual effects noted    Past Surgical History:  Procedure Laterality Date  . APPENDECTOMY     patient unsure of date  . BACK SURGERY    . BLADDER REPAIR    . CHOLECYSTECTOMY  Patient unsure of date.  . CORONARY ANGIOPLASTY  11/16/2001   1 stent  . ESOPHAGEAL MANOMETRY N/A 03/25/2013   Procedure: ESOPHAGEAL MANOMETRY (EM);  Surgeon: Inda Castle, MD;  Location: [facility];  Service: Endoscopy;  Laterality: N/A;  . ESOPHAGOGASTRODUODENOSCOPY N/A 03/15/2013   Procedure: ESOPHAGOGASTRODUODENOSCOPY (EGD);  Surgeon: Jerene Bears, MD;  Location: [facility] ENDOSCOPY;  Service: Endoscopy;  Laterality: N/A;  . EYE SURGERY  11/2000   bilateral cataracts with lens implant  . FIXATION KYPHOPLASTY LUMBAR SPINE  X 2   "L3-4"  .  HEMORRHOID SURGERY    . HIATAL HERNIA REPAIR  06/03/2011   Procedure: LAPAROSCOPIC REPAIR OF HIATAL HERNIA;  Surgeon: Adin Hector, MD;  Location: [facility];  Service: General;  Laterality: N/A;  . HIATAL HERNIA REPAIR N/A 04/24/2013   Procedure: LAPAROSCOPIC  REPAIR RECURRENT PARASOPHAGEAL HIATAL HERNIA WITH FUNDOPLICATION;  Surgeon: Adin Hector, MD;  Location: [facility];  Service: General;  Laterality: N/A;  . INSERTION OF MESH N/A 04/24/2013   Procedure: INSERTION OF MESH;  Surgeon: Adin Hector, MD;  Location: [facility];  Service: General;  Laterality: N/A;  . LAPAROSCOPIC LYSIS OF ADHESIONS N/A 04/24/2013   Procedure: LAPAROSCOPIC LYSIS OF ADHESIONS;  Surgeon: Adin Hector, MD;  Location: [facility];  Service: General;  Laterality: N/A;  . LAPAROSCOPIC NISSEN FUNDOPLICATION  [id number]   Procedure: LAPAROSCOPIC NISSEN FUNDOPLICATION;  Surgeon: Adin Hector, MD;  Location: [facility];  Service: General;  Laterality: N/A;  . LUMBAR LAMINECTOMY/DECOMPRESSION MICRODISCECTOMY Right 06/26/2012   Procedure: LUMBAR LAMINECTOMY/DECOMPRESSION MICRODISCECTOMY;  Surgeon: Jessy Oto, MD;  Location: [place];  Service: Orthopedics;  Laterality: Right;  RIGHT L4-5 MICRODISCECTOMY  . LUMBAR LAMINECTOMY/DECOMPRESSION MICRODISCECTOMY N/A 05/29/2015   Procedure: RIGHT L2-3 MICRODISCECTOMY;  Surgeon: Jessy Oto, MD;  Location: [place];  Service: Orthopedics;  Laterality: N/A;  . TOTAL ABDOMINAL HYSTERECTOMY  1975   partial    Prior to Admission medications   Medication Sig Start Date End Date Taking? Authorizing Provider  alendronate (FOSAMAX) 70 MG tablet Take 70 mg by mouth once a week. Take with a full glass of water on an empty stomach. Take on Mondays   Yes Historical Provider, MD  ALPRAZolam Duanne Moron) 0.5 MG tablet Take 1/2 to 1 tablet 2 or 3 x daily if needed for nerves 07/07/15  Yes [name], MD  amLODipine (NORVASC) 10 MG tablet Take 1 tablet (10 mg total) by mouth at bedtime. Patient taking  differently: Take 10 mg by mouth every morning.  09/20/14  Yes Reyne Dumas, MD  Cholecalciferol (VITAMIN D3) 2000 UNITS TABS Take 6,000 Units by mouth daily.    Yes Historical Provider, MD  docusate sodium (COLACE) 250 MG capsule Take 1 capsule (250 mg total) by mouth 2 (two) times daily. 03/03/15  Yes Junius Creamer, NP  feeding supplement, GLUCERNA SHAKE, (GLUCERNA SHAKE) LIQD Take 237 mLs by mouth 3 (three) times daily between meals. 12/05/13  Yes Shanker Kristeen Mans, MD  gabapentin (NEURONTIN) 100 MG capsule Take 1 capsule (100 mg total) by mouth 3 (three) times daily. 09/28/15 09/27/16 Yes [name], MD  gabapentin (NEURONTIN) 300 MG capsule Take 300 mg by mouth at bedtime.   Yes Historical Provider, MD  ipratropium-albuterol (DUONEB) 0.5-2.5 (3) MG/3ML SOLN USE ONE VIAL IN NEBULIZER 4 TIMES DAILY Patient taking differently: Inhale 3 mLs into the lungs every 6 (six) hours as needed. For shortness of breath 02/26/14  Yes [name], MD  losartan (COZAAR) 100 MG tablet TAKE  ONE TABLET BY MOUTH ONCE DAILY ( DISCONTINUE BENICAR) 09/30/15  Yes Vicie Mutters, PA-C  metoprolol (LOPRESSOR) 50 MG tablet TAKE ONE TABLET BY MOUTH TWICE DAILY FOR BLOOD PRESSURE Patient taking differently: take 0.5 tablet by mouth at bedtime 02/27/14  Yes [name], MD  mometasone-formoterol [facility]) 200-5 MCG/ACT AERO Inhale 2 puffs into the lungs 2 (two) times daily. Patient taking differently: Inhale 2 puffs into the lungs 2 (two) times daily as needed for wheezing.  07/03/13  Yes Kelby Aline, PA-C  orphenadrine (NORFLEX) 100 MG tablet Take 1 tablet (100 mg total) by mouth 2 (two) times daily as needed for muscle spasms. 05/30/15  Yes Jessy Oto, MD  OVER THE COUNTER MEDICATION Take 1,000 mg by mouth 2 (two) times daily. [place] Calcium 1000mg    Yes Historical Provider, MD  OVER THE COUNTER MEDICATION Take 400 mg by mouth 2 (two) times daily. Magnesium Carbonate 400mg    Yes Historical Provider, MD    oxyCODONE-acetaminophen (PERCOCET/ROXICET) 5-325 MG tablet Take 1-2 tablets by mouth every 4 (four) hours as needed for moderate pain. 05/30/15  Yes Jessy Oto, MD  pantoprazole (PROTONIX) 40 MG tablet Take 1 tablet (40 mg total) by mouth daily. 03/18/15  Yes [name], MD  polyethylene glycol [facility] / GLYCOLAX) packet Take 17 g by mouth 2 (two) times daily.   Yes Historical Provider, MD  potassium chloride SA (K-DUR,KLOR-CON) 20 MEQ tablet Take 1 tablet (20 mEq total) by mouth 2 (two) times daily. 11/25/14  Yes [name], MD  pravastatin (PRAVACHOL) 40 MG tablet Take 1 tablet (40 mg total) by mouth daily. 07/09/15  Yes [name], MD  predniSONE (DELTASONE) 20 MG tablet 3 tabs po daily x 3 days, then 2 tabs x 3 days, then 1.5 tabs x 3 days, then 1 tab x 3 days, then 0.5 tabs x 3 days 11/25/15  Yes Courtney Forcucci, PA-C  senna (SENOKOT) 8.6 MG TABS tablet Take 1 tablet by mouth 2 (two) times daily.  12/05/13  Yes Shanker Kristeen Mans, MD  silodosin (RAPAFLO) 8 MG CAPS capsule Take 8 mg by mouth every other day.    Yes Historical Provider, MD  sucralfate (CARAFATE) 1 G tablet TAKE ONE TABLET BY MOUTH 4 TIMES DAILY BEFORE MEALS AND AT BEDTIME FOR HEARTBURN 10/13/14  Yes Vicie Mutters, PA-C  tobramycin (TOBREX) 0.3 % ophthalmic ointment Place into both eyes 2 (two) times daily. Place a 1/2 inch ribbon of ointment into the lower eyelid. 11/25/15  Yes Courtney Forcucci, PA-C  albuterol (PROVENTIL HFA;VENTOLIN HFA) 108 (90 BASE) MCG/ACT inhaler Inhale 1-2 puffs into the lungs every 6 (six) hours as needed for wheezing or shortness of breath.    Historical Provider, MD  azithromycin (ZITHROMAX Z-PAK) 250 MG tablet 2 po day one, then 1 daily x 4 days Patient not taking: Reported on 11/30/2015 11/25/15   Loma Sousa Forcucci, PA-C  metFORMIN (GLUCOPHAGE-XR) 500 MG 24 hr tablet TAKE ONE TO TWO TABLETS BY MOUTH TWICE DAILY WITH MEALS FOR DIABETES Patient not taking: Reported on 11/30/2015 04/03/15    [name], MD  nitroGLYCERIN (NITROSTAT) 0.4 MG SL tablet Place 1 tablet (0.4 mg total) under the tongue every 5 (five) minutes as needed for chest pain. 09/04/14   Larey Dresser, MD  ondansetron (ZOFRAN ODT) 4 MG disintegrating tablet Take 1 tablet (4 mg total) by mouth every 8 (eight) hours as needed for nausea or vomiting. 04/23/14   Carlisle Cater, PA-C  prochlorperazine (COMPAZINE) 5 MG tablet Take 1 capsule  3 x day before meals only if needed for Nausea 09/26/14   [name], MD    Current Facility-Administered Medications  Medication Dose Route Frequency Provider Last Rate Last Dose  . 0.9 %  sodium chloride infusion  250 mL Intravenous Continuous Jessy Oto, MD      . 0.9 %  sodium chloride infusion   Intravenous Continuous Lanae Crumbly, PA-C 90 mL/hr at 12/08/15 2144    . acetaminophen (TYLENOL) tablet 650 mg  650 mg Oral Q4H PRN Jessy Oto, MD   650 mg at 12/09/15 0536   Or  . acetaminophen (TYLENOL) suppository 650 mg  650 mg Rectal Q4H PRN Jessy Oto, MD      . albuterol (PROVENTIL) (2.5 MG/3ML) 0.083% nebulizer solution 3 mL  3 mL Inhalation Q6H PRN Jessy Oto, MD      . ALPRAZolam Duanne Moron) tablet 0.5 mg  0.5 mg Oral TID PRN Jessy Oto, MD      . cholecalciferol (VITAMIN D) tablet 6,000 Units  6,000 Units Oral Daily Jessy Oto, MD   6,000 Units at 12/08/15 1039  . docusate sodium (COLACE) capsule 250 mg  250 mg Oral BID Jessy Oto, MD   250 mg at 12/08/15 1244  . feeding supplement (GLUCERNA SHAKE) (GLUCERNA SHAKE) liquid 237 mL  237 mL Oral TID BM Jessy Oto, MD   237 mL at 12/07/15 2153  . gabapentin (NEURONTIN) capsule 100 mg  100 mg Oral 3 times per day Jessy Oto, MD   100 mg at 12/09/15 0536  . gabapentin (NEURONTIN) capsule 300 mg  300 mg Oral QHS Jessy Oto, MD   300 mg at 12/07/15 2149  . HYDROmorphone (DILAUDID) injection 1 mg  1 mg Intravenous Q2H PRN Jessy Oto, MD   1 mg at 12/09/15 1440  . insulin aspart (novoLOG) injection  0-15 Units  0-15 Units Subcutaneous TID WC Jessy Oto, MD      . ipratropium-albuterol (DUONEB) 0.5-2.5 (3) MG/3ML nebulizer solution 3 mL  3 mL Inhalation Q6H PRN Jessy Oto, MD      . losartan (COZAAR) tablet 100 mg  100 mg Oral Daily Jessy Oto, MD      . menthol-cetylpyridinium (CEPACOL) lozenge 3 mg  1 lozenge Oral PRN Jessy Oto, MD       Or  . phenol (CHLORASEPTIC) mouth spray 1 spray  1 spray Mouth/Throat PRN Jessy Oto, MD      . methocarbamol (ROBAXIN) tablet 500 mg  500 mg Oral Q6H PRN Jessy Oto, MD   500 mg at 12/08/15 0556   Or  . methocarbamol (ROBAXIN) 500 mg in dextrose 5 % 50 mL IVPB  500 mg Intravenous Q6H PRN Jessy Oto, MD      . metoCLOPramide (REGLAN) injection 5 mg  5 mg Intravenous Q6H PRN Jessy Oto, MD   5 mg at 12/08/15 1906  . metoprolol (LOPRESSOR) tablet 50 mg  50 mg Oral BID Jessy Oto, MD   50 mg at 12/08/15 1038  . mometasone-formoterol (DULERA) 200-5 MCG/ACT inhaler 2 puff  2 puff Inhalation BID Jessy Oto, MD   2 puff at 12/09/15 [id number]  . ondansetron (ZOFRAN) injection 4 mg  4 mg Intravenous Q4H PRN Jessy Oto, MD   4 mg at 12/09/15 1440  . ondansetron (ZOFRAN-ODT) disintegrating tablet 4 mg  4 mg Oral Q8H PRN Jessy Oto, MD      .  orphenadrine (NORFLEX) 12 hr tablet 100 mg  100 mg Oral BID PRN Jessy Oto, MD      . oxyCODONE (OXYCONTIN) 12 hr tablet 10 mg  10 mg Oral Q12H Jessy Oto, MD      . oxyCODONE-acetaminophen (PERCOCET/ROXICET) 5-325 MG per tablet 1-2 tablet  1-2 tablet Oral Q4H PRN Jessy Oto, MD   2 tablet at 12/08/15 1700  . pantoprazole (PROTONIX) EC tablet 40 mg  40 mg Oral Daily Jessy Oto, MD   40 mg at 12/08/15 1038  . polyethylene glycol (MIRALAX / GLYCOLAX) packet 17 g  17 g Oral BID Jessy Oto, MD   17 g at 12/08/15 1038  . pravastatin (PRAVACHOL) tablet 40 mg  40 mg Oral q1800 Jessy Oto, MD   40 mg at 12/07/15 1730  . promethazine (PHENERGAN) injection 12.5 mg  12.5 mg Intravenous Q6H  PRN Jessy Oto, MD      . senna [facility]) tablet 8.6 mg  1 tablet Oral BID Jessy Oto, MD   8.6 mg at 12/08/15 1040  . sodium chloride flush (NS) 0.9 % injection 3 mL  3 mL Intravenous Q12H Jessy Oto, MD      . sodium chloride flush (NS) 0.9 % injection 3 mL  3 mL Intravenous PRN Jessy Oto, MD      . sucralfate (CARAFATE) tablet 1 g  1 g Oral TID WC & HS Jessy Oto, MD   1 g at 12/08/15 1039  . tamsulosin (FLOMAX) capsule 0.4 mg  0.4 mg Oral QPC breakfast Jessy Oto, MD   0.4 mg at 12/08/15 1038  . tobramycin (TOBREX) 0.3 % ophthalmic ointment   Both Eyes BID Jessy Oto, MD        Allergies as of 11/30/2015 - Review Complete 11/30/2015  Allergen Reaction Noted  . Aspartame Diarrhea and Nausea And Vomiting   . Daliresp [roflumilast] Nausea And Vomiting 05/23/2011  . Levofloxacin Nausea And Vomiting 11/04/2013  . Shellfish allergy Other (See Comments) 03/07/2014  . Atorvastatin Other (See Comments) 09/04/2014  . Ace inhibitors Cough 04/03/2013  . Biaxin [clarithromycin] Other (See Comments) 03/07/2014  . Erythromycin Other (See Comments) 03/07/2014  . Hydrocodone Nausea Only 05/16/2013    Family History  Problem Relation Age of Onset  . Cancer Mother     colon   . Colon cancer Mother 77  . Heart disease Father 63    heart attack    Social History   Social History  . Marital status: Married    Spouse name: [name]  . Number of children: 2  . Years of education: 12   Occupational History  . Retired Retired    worked at [employer]  . Smoking status: Never Smoker  . Smokeless tobacco: Never Used  . Alcohol use No  . Drug use: No  . Sexual activity: No   Other Topics Concern  . Not on file   Social History Narrative   Lives with husband.   Right-handed.   3 cups caffeine per day.    Review of Systems: Gen: Denies any fever or chills CV: Denies chest pain Resp: Denies dyspnea at rest or cough GI: Denies  vomiting blood fecal incontinence.   GU : Denies urinary burning, blood in urine, urinary frequency MS: some back pain Derm: Denies rash and itching.  Psych: Denies memory loss and confusion. Heme: Denies bruising Neuro:  Denies  dizziness.  Physical Exam: Vital signs in last 24 hours: Temp:  [98.6 F (37 C)] 98.6 F (37 C) (08/01 1925) Pulse Rate:  [90-101] 95 (08/02 0631) BP: (106-149)/(26-62) 149/53 (08/02 0631) SpO2:  [93 %-96 %] 96 % (08/02 0631) Last BM Date: 12/08/15 General:   Alert,  Well-developed, well-nourished, appears in discomfort Head:  Normocephalic and atraumatic. Eyes:  Sclera clear, no icterus.   Conjunctiva pink. Ears:  Normal auditory acuity. Nose:  No deformity, discharge,  or lesions. Mouth:  No deformity or lesions.   Lungs:  Clear throughout to auscultation.   No wheezes, crackles, or rhonchi. Heart:  Regular rate and rhythm; no murmurs, clicks, rubs,  or gallops. Abdomen:  Soft, diffusely tender to palpation, BS active but scant, nonpalp mass or hsm.   Rectal:  Deferred  Msk:  Symmetrical without gross deformities. . Pulses:  Normal pulses noted. Extremities:  Without clubbing or edema. Neurologic:  Alert and  oriented x3;  grossly normal neurologically. Skin:  Intact without significant lesions or rashes.. Psych:  Alert and cooperative. Normal mood and affect.  Intake/Output from previous day: 08/01 0701 - 08/02 0700 In: 1230 [P.O.:240; I.V.:990] Out: 3 [Urine:2; Stool:1] Intake/Output this shift: No intake/output data recorded.  Lab Results:  Recent Labs  12/08/15 0531 12/09/15 0847  WBC 18.6* 15.8*  HGB 10.5* 11.2*  HCT 33.4* 34.2*  PLT 230 208   BMET  Recent Labs  12/08/15 0531 12/09/15 0847 12/09/15 1426  NA 138 132* 130*  K 5.2* 3.8 3.6  CL 107 101 99*  CO2 25 23 21*  GLUCOSE 132* 119* 115*  BUN 22* 12 10  CREATININE 1.09* 0.77 0.69  CALCIUM 8.1* 7.5* 7.9*   LFT  Recent Labs  12/09/15 1426  PROT 6.2*  ALBUMIN  3.2*  AST 94*  ALT 43  ALKPHOS 55  BILITOT 0.9   PT/INR No results for input(s): LABPROT, INR in the last 72 hours. Hepatitis Panel No results for input(s): HEPBSAG, HCVAB, HEPAIGM, HEPBIGM in the last 72 hours.    Studies/Results: Dg Chest 2 View  Result Date: 12/09/2015 CLINICAL DATA:  Productive cough, status post recent back surgery EXAM: CHEST  2 VIEW COMPARISON:  12/04/2015 FINDINGS: Cardiac shadow is stable. The lungs are well aerated bilaterally. No focal infiltrate or sizable effusion is seen. No acute bony abnormality is noted. IMPRESSION: No active cardiopulmonary disease. Electronically Signed   By: Inez Catalina M.D.   On: 12/09/2015 09:19   Dg Abd 1 View  Result Date: 12/09/2015 CLINICAL DATA:  Nausea and vomiting today. Back surgery 2 days ago. Pain. EXAM: ABDOMEN - 1 VIEW COMPARISON:  Intraoperative spine films 12/07/2015. MRI lumbar spine 09/10/2015. FINDINGS: Mildly distended loops of small and large bowel favored to represent ileus. Moderate stool in the cecum. Previous vertebral augmentation at L2, L3, and L4, with more recently posterior pedicle screw fixation from L1 through L3 bilaterally. IMPRESSION: Question ileus.  No visible signs of bowel obstruction. Electronically Signed   By: Staci Righter M.D.   On: 12/09/2015 15:27    IMPRESSION:   Nausea: based on the abdominal x-ray, patients nausea could be secondary to partial ileus. However, she is on oxycodone and methocarbamol for post spinal surgery pain.   Leukocytosis: On labs 8/1 and 8/2. No fevers. Urine analysis pending. CXR today has no acute process. If no source found question if this a stress response to surgery.  Elevated AST  On lab 12/09/15  PLAN: * Discontinue methocarbamol due to the  anticholinergic effect.   *Try to limit narcotic use but will not discontinue.  *Continue Miralax and Senokot.  *Order KUB for tomorrow.  *Order CMP and Magnesium in the morning  *Follow urine.     Grayland Ormond PA-S 12/09/2015, 4:00 PM

## 2015-12-09 NOTE — Progress Notes (Signed)
Patient ID: [name] [name], female   DOB: 06/20/1935, 80 y.o.   MRN: [id number]  Just spoke with RN.  Dr Louanne Skye ordered UA this morning at 0831. Still has not been collected per orders.  Advised RN that this needs to be done.

## 2015-12-09 NOTE — Progress Notes (Signed)
Occupational Therapy Treatment Patient Details Name: NEVIYAH TALON MRN: [id number] DOB: 1936-05-02 Today's Date: 12/09/2015    History of present illness pt presents after L 2-3 Facetectomy and PLIF.  pt with hx of Anxiety, CAD, Chronic Back Pain, Vertigo, HTN, and TIA.     OT comments  Upon entering the room, pt very tearful. Session limited secondary to increased pain and nausea. Pt declined OOB tasks. Pt was able to verbalize 3/3 back precautions this session but needing min verbal cues for log roll technique. Pt remained in bed with RN notified with call bell and all needed items within reach.     Follow Up Recommendations  Home health OT;Supervision/Assistance - 24 hour    Equipment Recommendations  None recommended by OT    Recommendations for Other Services      Precautions / Restrictions Precautions Precautions: Fall;Back Required Braces or Orthoses: Spinal Brace Spinal Brace: Thoracolumbosacral orthotic;Applied in sitting position Restrictions Weight Bearing Restrictions: No       Mobility Bed Mobility Overal bed mobility: Needs Assistance Bed Mobility: Rolling Rolling: Min assist         General bed mobility comments: min verbal cues for log roll technique           ADL       General ADL Comments: Upon entering the room, pt very tearful and verbalizing urgency for toileting. Pt declined OOB tasks this session. Pt rolled L <> R in order to place bed pan with min A and min verbal cues for log roll technique. Pt able to void and performed peri hygiene with set up A. Pt reporting nauseated and increased pain and declined further OT at this time. RN notified.                        Pertinent Vitals/ Pain       Pain Score: 9  Pain Location: back Pain Descriptors / Indicators: Aching;Sore;Sharp;Operative site guarding Pain Intervention(s): Limited activity within patient's tolerance;Monitored during session;Repositioned;Patient requesting pain  meds-RN notified         Frequency Min 3X/week     Progress Toward Goals  OT Goals(current goals can now be found in the care plan section)  Progress towards OT goals: Progressing toward goals     Plan Frequency needs to be updated       End of Session     Activity Tolerance Patient limited by pain   Patient Left in bed;with call bell/phone within reach   Nurse Communication Patient requests pain meds        Time: [id number] OT Time Calculation (min): 11 min  Charges: OT General Charges $OT Visit: 1 Procedure OT Treatments $Self Care/Home Management : 8-22 mins  Pittman, Tonyetta Berko L, MS, OTR/L 12/09/2015, 10:15 AM

## 2015-12-09 NOTE — Progress Notes (Signed)
Patient has had severe nausea most of the day.  Patient has been given zofran, reglan and a new order of phenergan.  Patient complains of 8 out of 10 pain level. She has been unable to eat or drink due to nausea. New order of dilaudid given to patient. Dr. Louanne Skye notified of atypical symptoms of patient.  Labs, xray and new medication orders placed by MD.  Patient is still complaining of nausea and pain but is better.

## 2015-12-09 NOTE — Progress Notes (Signed)
Physical Therapy Treatment Patient Details Name: [name] [name] MRN: [id number] DOB: 06-17-1935 Today's Date: 12/09/2015    History of Present Illness pt presents after L 2-3 Facetectomy and PLIF.  pt with hx of Anxiety, CAD, Chronic Back Pain, Vertigo, HTN, and TIA.      PT Comments    Pt nauseated and indicates painful today limiting mobility.  Pt retching, but no vomiting during PT session.  Pt able to transfer from bed to commode and then to recliner with 2 staff members present for safety and management of equipment.  Pt will need continued education on back precautions and mobility when better able to attend to education.  If pt continues to be limited with her mobility, may need to consider SNF for further rehab and nsg care prior to returning to home.    Follow Up Recommendations  Home health PT;Supervision/Assistance - 24 hour     Equipment Recommendations  None recommended by PT    Recommendations for Other Services       Precautions / Restrictions Precautions Precautions: Fall;Back Required Braces or Orthoses: Spinal Brace Spinal Brace: Thoracolumbosacral orthotic;Applied in sitting position Restrictions Weight Bearing Restrictions: No    Mobility  Bed Mobility Overal bed mobility: Needs Assistance Bed Mobility: Rolling;Sidelying to Sit Rolling: Min guard Sidelying to sit: Mod assist       General bed mobility comments: pt able to initiate log roll with minimal cueing.  A for brining trunk up to sitting.    Transfers Overall transfer level: Needs assistance Equipment used: Rolling walker (2 wheeled) Transfers: Sit to/from [facility] Sit to Stand: Min assist Stand pivot transfers: Min assist       General transfer comment: cues for UE use and movement through pivot.  pt needs A for balance and movement of RW through pivot.  Transferred x2.    Ambulation/Gait                 Stairs            Wheelchair Mobility     Modified Rankin (Stroke Patients Only)       Balance Overall balance assessment: Needs assistance Sitting-balance support: Bilateral upper extremity supported;Feet supported Sitting balance-Leahy Scale: Poor     Standing balance support: Bilateral upper extremity supported;During functional activity Standing balance-Leahy Scale: Poor                      Cognition Arousal/Alertness: Awake/alert Behavior During Therapy: WFL for tasks assessed/performed Overall Cognitive Status: Within Functional Limits for tasks assessed                      Exercises      General Comments        Pertinent Vitals/Pain Pain Assessment: 0-10 Pain Score: 9  Pain Location: Back Pain Descriptors / Indicators: Aching;Grimacing;Guarding Pain Intervention(s): Monitored during session;Premedicated before session;Repositioned    Home Living                      Prior Function            PT Goals (current goals can now be found in the care plan section) Acute Rehab PT Goals Patient Stated Goal: Be home with husband. PT Goal Formulation: With patient Time For Goal Achievement: 12/15/15 Potential to Achieve Goals: Good Progress towards PT goals: Progressing toward goals    Frequency  Min 5X/week    PT Plan Current plan remains appropriate  Co-evaluation             End of Session Equipment Utilized During Treatment: Gait belt;Back brace Activity Tolerance: Patient limited by pain (Limited by Nausea) Patient left: in chair;with call bell/phone within reach;with family/visitor present     Time: [id number] PT Time Calculation (min) (ACUTE ONLY): 33 min  Charges:  $Therapeutic Activity: 23-37 mins                    G CodesCatarina Hartshorn, [place] 12/09/2015, 2:39 PM

## 2015-12-09 NOTE — Progress Notes (Signed)
Nursing call, concerned because patient is having intractable nausea and heaving without emesis. Allergy to hydrocodone and previous history of esophageal hernia  Repair and esophageal strictures.  Seen this morning. Her leg pain is resolved post decompression and fusion. Productive cough, CXR today is negative for any acute changes, she has some mild cardiomegally. I stopped her dilaudid this AM and was planning to stop her IVF but she is now having Increased problems with nausea that is intractable to zofran or reglan. I will try phenergan And obtain a KUB. Assessment:Increasing symptoms of UGI obstruction vs medication reaction. As she has a past history of UGI conditions I will ask for a [facility] consult to help with management of her GI problems. Continue with IVF and restart IV narcotics and IV anti emetic meds. Check amylase and lipase and change BMET to a CMET, hopefully with the blood already drawn. A urinalysis was already ordered.

## 2015-12-09 NOTE — Care Management Note (Signed)
Case Management Note  Patient Details  Name: DERRICA ALIOTO MRN: [id number] Date of Birth: Jun 02, 1935  Subjective/Objective:    80 yr old female s/p L2-L3 lumbar fusion.                Action/Plan: Case manager spoke with patient concerning [facility] and DME needs. Choice was offered. Referral was called to [facility], [facility]. Patient states she has a rolling walker and 3in1. Will have family support at discharge.    Expected Discharge Date:   12/10/15               Expected Discharge Plan:  [place]  In-House Referral:  NA  Discharge planning Services  CM Consult  Post Acute Care Choice:  Home Health Choice offered to:  Patient  DME Arranged:  N/A DME Agency:  NA  HH Arranged:  PT, OT HH Agency:  [facility]  Status of Service:  Completed, signed off  If discussed at [place] of Stay Meetings, dates discussed:    Additional Comments:  Ninfa Meeker, RN 12/09/2015, 2:34 PM

## 2015-12-09 NOTE — Progress Notes (Signed)
     Subjective: 2 Days Post-Op Procedure(s) (LRB): Right L2-3 facetectomy, posterolateral fusion L2-3 with pedicle screws, rods, local bone graft, Vivigen cancellous chips (N/A) Awake,alert and oriented x 4. Productive cough this AM.  Patient reports pain as moderate.    Objective:   VITALS:  Temp:  [98.6 F (37 C)-99.9 F (37.7 C)] 98.6 F (37 C) (08/01 1925) Pulse Rate:  [90-101] 95 (08/02 0631) Resp:  [18] 18 (08/01 1300) BP: (106-149)/(26-69) 149/53 (08/02 0631) SpO2:  [93 %-96 %] 96 % (08/02 0631)  Neurologically intact ABD soft Neurovascular intact Sensation intact distally Intact pulses distally Dorsiflexion/Plantar flexion intact Incision: scant drainage Coarse lung sounds with productive cough, feels warm,h/o COPD   LABS  Recent Labs  12/08/15 0531  HGB 10.5*  WBC 18.6*  PLT 230    Recent Labs  12/08/15 0531  NA 138  K 5.2*  CL 107  CO2 25  BUN 22*  CREATININE 1.09*  GLUCOSE 132*   No results for input(s): LABPT, INR in the last 72 hours.   Assessment/Plan: 2 Days Post-Op Procedure(s) (LRB): Right L2-3 facetectomy, posterolateral fusion L2-3 with pedicle screws, rods, local bone graft, Vivigen cancellous chips (N/A)  Advance diet Up with therapy  Obtain serum cortisol level and order CXR Need to increase pulmonary  Layan Zalenski E 12/09/2015, 8:23 AM

## 2015-12-10 ENCOUNTER — Encounter (HOSPITAL_COMMUNITY): Payer: Self-pay | Admitting: General Practice

## 2015-12-10 ENCOUNTER — Inpatient Hospital Stay (HOSPITAL_COMMUNITY): Payer: PPO

## 2015-12-10 DIAGNOSIS — J449 Chronic obstructive pulmonary disease, unspecified: Secondary | ICD-10-CM | POA: Diagnosis not present

## 2015-12-10 DIAGNOSIS — J45909 Unspecified asthma, uncomplicated: Secondary | ICD-10-CM | POA: Diagnosis not present

## 2015-12-10 DIAGNOSIS — Z955 Presence of coronary angioplasty implant and graft: Secondary | ICD-10-CM | POA: Diagnosis not present

## 2015-12-10 DIAGNOSIS — F419 Anxiety disorder, unspecified: Secondary | ICD-10-CM | POA: Diagnosis not present

## 2015-12-10 DIAGNOSIS — K567 Ileus, unspecified: Secondary | ICD-10-CM | POA: Diagnosis not present

## 2015-12-10 DIAGNOSIS — M4186 Other forms of scoliosis, lumbar region: Secondary | ICD-10-CM | POA: Diagnosis not present

## 2015-12-10 DIAGNOSIS — M4856XA Collapsed vertebra, not elsewhere classified, lumbar region, initial encounter for fracture: Secondary | ICD-10-CM | POA: Diagnosis not present

## 2015-12-10 DIAGNOSIS — M4806 Spinal stenosis, lumbar region: Secondary | ICD-10-CM | POA: Diagnosis not present

## 2015-12-10 DIAGNOSIS — E875 Hyperkalemia: Secondary | ICD-10-CM | POA: Diagnosis not present

## 2015-12-10 DIAGNOSIS — M5416 Radiculopathy, lumbar region: Secondary | ICD-10-CM | POA: Diagnosis not present

## 2015-12-10 DIAGNOSIS — I2510 Atherosclerotic heart disease of native coronary artery without angina pectoris: Secondary | ICD-10-CM | POA: Diagnosis not present

## 2015-12-10 LAB — CBC WITH DIFFERENTIAL/PLATELET
BASOS PCT: 0 %
Basophils Absolute: 0 10*3/uL (ref 0.0–0.1)
EOS ABS: 0 10*3/uL (ref 0.0–0.7)
Eosinophils Relative: 0 %
HEMATOCRIT: 32.9 % — AB (ref 36.0–46.0)
HEMOGLOBIN: 11.4 g/dL — AB (ref 12.0–15.0)
LYMPHS ABS: 1 10*3/uL (ref 0.7–4.0)
Lymphocytes Relative: 6 %
MCH: 32.2 pg (ref 26.0–34.0)
MCHC: 34.7 g/dL (ref 30.0–36.0)
MCV: 92.9 fL (ref 78.0–100.0)
MONO ABS: 2 10*3/uL — AB (ref 0.1–1.0)
MONOS PCT: 11 %
NEUTROS ABS: 14.4 10*3/uL — AB (ref 1.7–7.7)
NEUTROS PCT: 83 %
Platelets: 186 10*3/uL (ref 150–400)
RBC: 3.54 MIL/uL — ABNORMAL LOW (ref 3.87–5.11)
RDW: 13.2 % (ref 11.5–15.5)
WBC: 17.2 10*3/uL — ABNORMAL HIGH (ref 4.0–10.5)

## 2015-12-10 LAB — GLUCOSE, CAPILLARY
GLUCOSE-CAPILLARY: 146 mg/dL — AB (ref 65–99)
Glucose-Capillary: 126 mg/dL — ABNORMAL HIGH (ref 65–99)
Glucose-Capillary: 181 mg/dL — ABNORMAL HIGH (ref 65–99)
Glucose-Capillary: 170 mg/dL — ABNORMAL HIGH (ref 65–99)

## 2015-12-10 LAB — COMPREHENSIVE METABOLIC PANEL
ALBUMIN: 2.8 g/dL — AB (ref 3.5–5.0)
ALK PHOS: 46 U/L (ref 38–126)
ALT: 34 U/L (ref 14–54)
AST: 68 U/L — AB (ref 15–41)
Anion gap: 9 (ref 5–15)
BUN: 9 mg/dL (ref 6–20)
CALCIUM: 7.8 mg/dL — AB (ref 8.9–10.3)
CHLORIDE: 99 mmol/L — AB (ref 101–111)
CO2: 23 mmol/L (ref 22–32)
CREATININE: 0.59 mg/dL (ref 0.44–1.00)
GFR calc non Af Amer: >60 mL/min (ref >60)
GLUCOSE: 122 mg/dL — AB (ref 65–99)
Potassium: 3.7 mmol/L (ref 3.5–5.1)
SODIUM: 131 mmol/L — AB (ref 135–145)
Total Bilirubin: 0.9 mg/dL (ref 0.3–1.2)
Total Protein: 5.8 g/dL — ABNORMAL LOW (ref 6.5–8.1)

## 2015-12-10 LAB — MAGNESIUM: Magnesium: 1.7 mg/dL (ref 1.7–2.4)

## 2015-12-10 LAB — PHOSPHORUS: PHOSPHORUS: 1.6 mg/dL — AB (ref 2.5–4.6)

## 2015-12-10 MED ORDER — METHYLPREDNISOLONE SODIUM SUCC 125 MG IJ SOLR
100.0000 mg | Freq: Two times a day (BID) | INTRAMUSCULAR | Status: AC
  Administered 2015-12-10 (×2): 100 mg via INTRAVENOUS
  Filled 2015-12-10 (×2): qty 2

## 2015-12-10 MED ORDER — CALCIUM GLUCONATE 500 MG PO TABS
1.0000 | ORAL_TABLET | Freq: Two times a day (BID) | ORAL | Status: DC
  Filled 2015-12-10: qty 1

## 2015-12-10 MED ORDER — CALCIUM CARBONATE 1250 (500 CA) MG PO TABS
500.0000 mg | ORAL_TABLET | Freq: Two times a day (BID) | ORAL | Status: DC
  Administered 2015-12-10 – 2015-12-11 (×3): 500 mg via ORAL
  Filled 2015-12-10 (×2): qty 1

## 2015-12-10 NOTE — Progress Notes (Signed)
   12/10/15 1615  Clinical Encounter Type  Visited With Patient;Family  Visit Type Initial  Spiritual Encounters  Spiritual Needs Emotional  Chaplain on afternoon rounds visited with patient.  Chaplain made further support available if needed.

## 2015-12-10 NOTE — Progress Notes (Signed)
[facility] Progress Note  Subjective: Patient is still having nausea with no pain despite the use of Zofran, Phenergan, or Reglan. She denies any bowel movements yesterday but having one this morning, confirmed by RN. Since bowel movement, she has tolerated PO medications and fluids.    XR abdomen 12/10/15 FINDINGS: No dilated bowel today. Bowel gas pattern is normal. No abnormal stool retention or impaction. No suspicious gas collection. Cholecystectomy clips, lumbar fusion, and multilevel vertebroplasty.  Objective:  Vital signs in last 24 hours: Temp:  [98.3 F (36.8 C)-98.6 F (37 C)] 98.6 F (37 C) (08/03 0502) Pulse Rate:  [90-101] 90 (08/03 0502) BP: (156-175)/(58-141) 158/68 (08/03 0543) SpO2:  [98 %] 98 % (08/03 0502) Last BM Date: 12/08/15 General:   Alert,  Well-developed,    in NAD Heart:  Regular rate and rhythm; no murmurs Pulm: Clear to auscultation bilaterally Abdomen:  Soft, mildly tender and nondistended. Normal bowel sounds, without guarding, and without rebound.   Extremities:  Without edema. Neurologic:  Alert and  oriented x3;  grossly normal neurologically. Psych:  Alert and cooperative. Normal mood and affect.  Intake/Output from previous day: No intake/output data recorded. Intake/Output this shift: No intake/output data recorded.  Lab Results:  Recent Labs  12/08/15 0531 12/09/15 0847 12/10/15 0348  WBC 18.6* 15.8* 17.2*  HGB 10.5* 11.2* 11.4*  HCT 33.4* 34.2* 32.9*  PLT 230 208 186   BMET  Recent Labs  12/09/15 0847 12/09/15 1426 12/10/15 0348  NA 132* 130* 131*  K 3.8 3.6 3.7  CL 101 99* 99*  CO2 23 21* 23  GLUCOSE 119* 115* 122*  BUN 12 10 9   CREATININE 0.77 0.69 0.59  CALCIUM 7.5* 7.9* 7.8*   LFT  Recent Labs  12/10/15 0348  PROT 5.8*  ALBUMIN 2.8*  AST 68*  ALT 34  ALKPHOS 46  BILITOT 0.9   PT/INR No results for input(s): LABPROT, INR in the last 72 hours. Hepatitis Panel No results for  input(s): HEPBSAG, HCVAB, HEPAIGM, HEPBIGM in the last 72 hours.  Dg Chest 2 View  Result Date: 12/09/2015 CLINICAL DATA:  Productive cough, status post recent back surgery EXAM: CHEST  2 VIEW COMPARISON:  12/04/2015 FINDINGS: Cardiac shadow is stable. The lungs are well aerated bilaterally. No focal infiltrate or sizable effusion is seen. No acute bony abnormality is noted. IMPRESSION: No active cardiopulmonary disease. Electronically Signed   By: Inez Catalina M.D.   On: 12/09/2015 09:19   Dg Abd 1 View  Result Date: 12/10/2015 CLINICAL DATA:  Follow-up ileus. EXAM: ABDOMEN - 1 VIEW COMPARISON:  Yesterday FINDINGS: No dilated bowel today. Bowel gas pattern is normal. No abnormal stool retention or impaction. No suspicious gas collection. Cholecystectomy clips, lumbar fusion, and multilevel vertebroplasty. IMPRESSION: Normal bowel gas pattern. Electronically Signed   By: Monte Fantasia M.D.   On: 12/10/2015 08:17   Dg Abd 1 View  Result Date: 12/09/2015 CLINICAL DATA:  Nausea and vomiting today. Back surgery 2 days ago. Pain. EXAM: ABDOMEN - 1 VIEW COMPARISON:  Intraoperative spine films 12/07/2015. MRI lumbar spine 09/10/2015. FINDINGS: Mildly distended loops of small and large bowel favored to represent ileus. Moderate stool in the cecum. Previous vertebral augmentation at L2, L3, and L4, with more recently posterior pedicle screw fixation from L1 through L3 bilaterally. IMPRESSION: Question ileus.  No visible signs of bowel obstruction. Electronically Signed   By: Staci Righter M.D.   On: 12/09/2015 15:27    Assessment / Plan:  *  Nausea/ Ileus: Nausea is still present but improving. Had a bowel movement today and is tolerating PO medication and fluids. Abdomen is less distended than yesterday and bowel sounds have improved. Magnesium is within normal limits. XR today shows no ileus. Steroids started by Lurene Shadow PA-C for prior use of steroids per the RN.  Will continue Miralax and Senokot.     Continue to limit narcotic use.  Elevated AST: is trending down on labs today from 94 >> 68.  Leukocytosis: elevated from yesterday 15.8>>>17.2. Urine shows no infection. CXR no acute process. No fever.  Hypophosphatasemia: suggest replacing.       LOS: 3 days   Grayland Ormond PA-S 12/10/2015, 10:35 AM

## 2015-12-10 NOTE — Care Management Important Message (Signed)
Important Message  Patient Details  Name: [name] [name] MRN: [id number] Date of Birth: 15-Mar-1936   Medicare Important Message Given:  Yes    [name] 12/10/2015, 10:56 AM

## 2015-12-10 NOTE — Progress Notes (Signed)
Physical Therapy Treatment Patient Details Name: [name] [name] MRN: [id number] DOB: 1935-05-23 Today's Date: 12/10/2015    History of Present Illness pt presents after L 2-3 Facetectomy and PLIF.  pt with hx of Anxiety, CAD, Chronic Back Pain, Vertigo, HTN, and TIA.      PT Comments    Pt performed increased mobility progressing to gait training.  Pt unsteady and will require assistance at home for safety but overall moving better at this point.  Will continue to follow patient during acute hospitalization.    Follow Up Recommendations  Home health PT;Supervision/Assistance - 24 hour     Equipment Recommendations  None recommended by PT    Recommendations for Other Services       Precautions / Restrictions Precautions Precautions: Fall;Back Precaution Booklet Issued: Yes (comment) Precaution Comments: Pt provided with back handout (BAT) precautions Required Braces or Orthoses: Spinal Brace Spinal Brace: Thoracolumbosacral orthotic;Applied in sitting position (Requires total assist for brace application.  ) Restrictions Weight Bearing Restrictions: No    Mobility  Bed Mobility Overal bed mobility: Needs Assistance Bed Mobility: Sit to Sidelying;Rolling Rolling: Min guard       Sit to sidelying: Min assist General bed mobility comments: Pt required assist to lift B limbs against gravity.    Transfers Overall transfer level: Needs assistance Equipment used: Rolling walker (2 wheeled) Transfers: Sit to/from Stand Sit to Stand: Min guard Stand pivot transfers: Min guard       General transfer comment: Cues for hand placement with tactile guidance.  Pt performed sit to stand from bed and 3:1 commode x3 transfers total during session.  Pt remains to require cues for hand placement with each attempt.    Ambulation/Gait Ambulation/Gait assistance: Min assist Ambulation Distance (Feet): 140 Feet Assistive device: Rolling walker (2 wheeled) Gait Pattern/deviations:  Step-through pattern;Antalgic   Gait velocity interpretation: <1.8 ft/sec, indicative of risk for recurrent falls General Gait Details: Instability noted, no true LOB, weakness in LEs observed.     Stairs            Wheelchair Mobility    Modified Rankin (Stroke Patients Only)       Balance                                    Cognition Arousal/Alertness: Awake/alert Behavior During Therapy: WFL for tasks assessed/performed Overall Cognitive Status: Within Functional Limits for tasks assessed                      Exercises      General Comments        Pertinent Vitals/Pain Pain Assessment: 0-10 Pain Score: 6  Pain Location: Back Pain Descriptors / Indicators: Guarding;Grimacing;Aching Pain Intervention(s): Monitored during session;Repositioned;Limited activity within patient's tolerance;Patient requesting pain meds-RN notified    Home Living                      Prior Function            PT Goals (current goals can now be found in the care plan section) Acute Rehab PT Goals Patient Stated Goal: Be home with husband. Potential to Achieve Goals: Good Progress towards PT goals: Progressing toward goals    Frequency  Min 5X/week    PT Plan Current plan remains appropriate    Co-evaluation             End  of Session Equipment Utilized During Treatment: Gait belt;Back brace Activity Tolerance: Patient limited by pain Patient left: in chair;with call bell/phone within reach;with family/visitor present     Time: [id number] PT Time Calculation (min) (ACUTE ONLY): 29 min  Charges:  $Gait Training: 8-22 mins $Therapeutic Activity: 8-22 mins                    G Codes:      Cristela Blue 12/23/2015, 2:35 PM  Governor Rooks, PTA pager [phone]

## 2015-12-10 NOTE — Progress Notes (Signed)
Occupational Therapy Treatment Patient Details Name: [name] [name] MRN: [id number] DOB: [birth year] Today's Date: 12/10/2015    History of present illness pt presents after L 2-3 Facetectomy and PLIF.  pt with hx of Anxiety, CAD, Chronic Back Pain, Vertigo, HTN, and TIA.     OT comments  Session focused on AE education and practice to maintain back precautions during ADL. Pt and family open and willing to listen and participate. OT will continue to follow in acute care setting to ensure safety during ADL transfers and transitions as well as reinforcing back precautions.    Follow Up Recommendations  Home health OT;Supervision/Assistance - 24 hour    Equipment Recommendations  None recommended by OT;Other (comment) (AE suggested to help maintain precautions. Pt has grabbers)    Recommendations for Other Services      Precautions / Restrictions Precautions Precautions: Fall;Back Precaution Booklet Issued: Yes (comment) Precaution Comments: Pt provided with back handout (BAT) precautions Required Braces or Orthoses: Spinal Brace Spinal Brace: Thoracolumbosacral orthotic Restrictions Weight Bearing Restrictions: No       Mobility Bed Mobility Overal bed mobility: Needs Assistance Bed Mobility: Sit to Sidelying;Rolling Rolling: Min guard       Sit to sidelying: Min assist General bed mobility comments: Pt required assist to lift B limbs against gravity.    Transfers Overall transfer level: Needs assistance Equipment used: Rolling walker (2 wheeled) Transfers: Sit to/from Stand Sit to Stand: Min guard Stand pivot transfers: Min guard       General transfer comment: Cues for hand placement with tactile guidance.  Pt performed sit to stand from bed and 3:1 commode x3 transfers total during session.  Pt remains to require cues for hand placement with each attempt.      Balance                                   ADL Overall ADL's : Needs  assistance/impaired             Lower Body Bathing: Set up;Sit to/from stand;With adaptive equipment;With caregiver independent assisting (rec extended bath sponge - demonstrated for Pt/family)       Lower Body Dressing: Minimal assistance;With adaptive equipment;With caregiver independent assisting;Adhering to back precautions;Sit to/from stand Lower Body Dressing Details (indicate cue type and reason): Pt and family (daughter, husband, older brother) provided with AE education and demonstration for LB dressing       Toileting - Clothing Manipulation Details (indicate cue type and reason): Pt educated on maintaining precautions during toileting and provided with tong education and demonstration       General ADL Comments: Pt much more alert today. Pt just fininshed session with PTA. Pt and family recieved and practiced educatuion with AE for toileting. LB dressing and bathing.      Vision                     Perception     Praxis      Cognition   Behavior During Therapy: [facility] for tasks assessed/performed Overall Cognitive Status: Within Functional Limits for tasks assessed                       Extremity/Trunk Assessment               Exercises     Shoulder Instructions       General Comments  Pertinent Vitals/ Pain       Pain Assessment: Faces Pain Score: 6  Faces Pain Scale: Hurts a little bit Pain Location: Back Pain Descriptors / Indicators: Grimacing;Aching Pain Intervention(s): Limited activity within patient's tolerance;Monitored during session  Home Living                                          Prior Functioning/Environment              Frequency Min 3X/week     Progress Toward Goals  OT Goals(current goals can now be found in the care plan section)  Progress towards OT goals: Progressing toward goals  Acute Rehab OT Goals Patient Stated Goal: Be home with husband. OT Goal Formulation:  With patient Time For Goal Achievement: 12/15/15 Potential to Achieve Goals: Good ADL Goals Pt Will Perform Upper Body Dressing: with supervision;with caregiver independent in assisting;sitting Pt Will Perform Lower Body Dressing: with min assist;with caregiver independent in assisting;sit to/from stand Pt Will Transfer to Toilet: with supervision;ambulating;bedside commode Pt Will Perform Toileting - Clothing Manipulation and hygiene: with min assist;with caregiver independent in assisting;sit to/from stand Pt Will Perform Tub/Shower Transfer: with min assist;with caregiver independent in assisting;shower seat;ambulating;rolling walker Additional ADL Goal #1: Pt will recall 3 out of 3 back precautions. Additional ADL Goal #2: Pt will don/doff back brace with max assistance with caregiver independent in assisting.  Plan Discharge plan remains appropriate    Co-evaluation                 End of Session     Activity Tolerance Patient limited by fatigue (just completed ambulation with PT)   Patient Left in bed;with call bell/phone within reach;with family/visitor present   Nurse Communication Mobility status        Time: [id number] OT Time Calculation (min): 15 min  Charges: OT General Charges $OT Visit: 1 Procedure OT Treatments $Self Care/Home Management : 8-22 mins  Merri Ray Myrka Sylva OTR/L 12/10/2015, 4:07 PM [phone]

## 2015-12-10 NOTE — Progress Notes (Addendum)
Subjective: Patient had a good BM early this AM.  Seen by GI.  still c/o nausea.     Objective: Vital signs in last 24 hours: Temp:  [98.3 F (36.8 C)-98.6 F (37 C)] 98.6 F (37 C) (08/03 0502) Pulse Rate:  [90-101] 90 (08/03 0502) BP: (156-175)/(58-141) 158/68 (08/03 0543) SpO2:  [98 %] 98 % (08/03 0502)  Intake/Output from previous day: No intake/output data recorded. Intake/Output this shift: No intake/output data recorded.   Recent Labs  12/08/15 0531 12/09/15 0847 12/10/15 0348  HGB 10.5* 11.2* 11.4*    Recent Labs  12/09/15 0847 12/10/15 0348  WBC 15.8* 17.2*  RBC 3.57* 3.54*  HCT 34.2* 32.9*  PLT 208 186    Recent Labs  12/09/15 1426 12/10/15 0348  NA 130* 131*  K 3.6 3.7  CL 99* 99*  CO2 21* 23  BUN 10 9  CREATININE 0.69 0.59  GLUCOSE 115* 122*  CALCIUM 7.9* 7.8*   No results for input(s): LABPT, INR in the last 72 hours.  Exam:  Seen with Dr Louanne Skye.  Alert and oriented. Very lethargic.  Neurologically intact.    Assessment/Plan: -Ordered solumedrol IV x 2 doses IV rate was decreased to 20ml/hr despite order for 180ml/hr.  Spoke with director and she will look into this. -May need snf for rehab.  Will see how she does today.     OWENS,Shonnie Poudrier M 12/10/2015, 8:31 AM

## 2015-12-11 LAB — CBC WITH DIFFERENTIAL/PLATELET
BASOS PCT: 0 %
Basophils Absolute: 0 10*3/uL (ref 0.0–0.1)
Eosinophils Absolute: 0 10*3/uL (ref 0.0–0.7)
Eosinophils Relative: 0 %
HEMATOCRIT: 34.6 % — AB (ref 36.0–46.0)
HEMOGLOBIN: 11.7 g/dL — AB (ref 12.0–15.0)
Lymphocytes Relative: 6 %
Lymphs Abs: 0.5 10*3/uL — ABNORMAL LOW (ref 0.7–4.0)
MCH: 31.6 pg (ref 26.0–34.0)
MCHC: 33.8 g/dL (ref 30.0–36.0)
MCV: 93.5 fL (ref 78.0–100.0)
MONOS PCT: 6 %
Monocytes Absolute: 0.5 10*3/uL (ref 0.1–1.0)
NEUTROS ABS: 7.1 10*3/uL (ref 1.7–7.7)
NEUTROS PCT: 88 %
Platelets: 236 10*3/uL (ref 150–400)
RBC: 3.7 MIL/uL — ABNORMAL LOW (ref 3.87–5.11)
RDW: 13.2 % (ref 11.5–15.5)
WBC: 8.1 10*3/uL (ref 4.0–10.5)

## 2015-12-11 LAB — GLUCOSE, CAPILLARY
GLUCOSE-CAPILLARY: 167 mg/dL — AB (ref 65–99)
GLUCOSE-CAPILLARY: 118 mg/dL — AB (ref 65–99)
Glucose-Capillary: 150 mg/dL — ABNORMAL HIGH (ref 65–99)

## 2015-12-11 LAB — BASIC METABOLIC PANEL
ANION GAP: 8 (ref 5–15)
BUN: 14 mg/dL (ref 6–20)
CHLORIDE: 105 mmol/L (ref 101–111)
CO2: 22 mmol/L (ref 22–32)
CREATININE: 0.68 mg/dL (ref 0.44–1.00)
Calcium: 8.6 mg/dL — ABNORMAL LOW (ref 8.9–10.3)
GFR calc non Af Amer: >60 mL/min (ref >60)
Glucose, Bld: 166 mg/dL — ABNORMAL HIGH (ref 65–99)
Potassium: 3.7 mmol/L (ref 3.5–5.1)
Sodium: 135 mmol/L (ref 135–145)

## 2015-12-11 MED ORDER — OXYCODONE HCL 5 MG PO TABS
5.0000 mg | ORAL_TABLET | ORAL | Status: DC | PRN
  Administered 2015-12-11: 5 mg via ORAL
  Filled 2015-12-11: qty 1

## 2015-12-11 MED ORDER — METFORMIN HCL ER 500 MG PO TB24
500.0000 mg | ORAL_TABLET | Freq: Two times a day (BID) | ORAL | Status: DC
  Administered 2015-12-11 (×2): 500 mg via ORAL
  Filled 2015-12-11 (×2): qty 1

## 2015-12-11 MED ORDER — OXYCODONE HCL ER 10 MG PO T12A: 10.0000 mg | EXTENDED_RELEASE_TABLET | Freq: Two times a day (BID) | ORAL | 0 refills | Status: DC

## 2015-12-11 MED ORDER — ONDANSETRON 4 MG PO TBDP: 4.0000 mg | ORAL_TABLET | Freq: Three times a day (TID) | ORAL | 1 refills | Status: DC | PRN

## 2015-12-11 MED ORDER — POLYETHYLENE GLYCOL 3350 17 G PO PACK: 17.0000 g | PACK | Freq: Two times a day (BID) | ORAL | 1 refills | Status: DC

## 2015-12-11 MED ORDER — DM-GUAIFENESIN ER 30-600 MG PO TB12
1.0000 | ORAL_TABLET | Freq: Two times a day (BID) | ORAL | Status: DC
  Administered 2015-12-11: 1 via ORAL
  Filled 2015-12-11 (×3): qty 1

## 2015-12-11 MED ORDER — OXYCODONE HCL 5 MG PO TABS: 5.0000 mg | ORAL_TABLET | ORAL | 0 refills | Status: DC | PRN

## 2015-12-11 NOTE — Progress Notes (Signed)
Occupational Therapy Treatment Patient Details Name: [name] [name] MRN: [id number] DOB: [birth year] Today's Date: 12/11/2015    History of present illness pt presents after L 2-3 Facetectomy and PLIF.  pt with hx of Anxiety, CAD, Chronic Back Pain, Vertigo, HTN, and TIA.     OT comments  Pt able to sit EOB, family not present so therapist donned brace under Pt direction. Pt continuing to make progress towards goals (please see below) and continues to benefit from skilled OT intervention in the acute care setting.    Follow Up Recommendations  Home health OT;Supervision/Assistance - 24 hour    Equipment Recommendations  None recommended by OT;Other (comment) (AE suggested to help maintain precautions. Pt has grabbers)    Recommendations for Other Services      Precautions / Restrictions Precautions Precautions: Fall;Back Precaution Booklet Issued: Yes (comment) Precaution Comments: Pt provided with back handout (BAT) precautions Required Braces or Orthoses: Spinal Brace Spinal Brace: Thoracolumbosacral orthotic Restrictions Weight Bearing Restrictions: No       Mobility Bed Mobility Overal bed mobility: Needs Assistance Bed Mobility: Rolling;Sidelying to Sit Rolling: Min guard Sidelying to sit: Min assist     Sit to sidelying: Min assist General bed mobility comments: Pt required assist to lift B limbs against gravity.    Transfers Overall transfer level: Needs assistance Equipment used: Rolling walker (2 wheeled);1 person hand held assist (hand held assist initially to [facility] due to urgency) Transfers: Sit to/from Stand Sit to Stand: Min guard              Balance Overall balance assessment: Needs assistance Sitting-balance support: Single extremity supported Sitting balance-Leahy Scale: Fair Sitting balance - Comments: pain increases with sitting impacting balance as Pt guards - even with brace   Standing balance support: Bilateral upper extremity  supported Standing balance-Leahy Scale: Poor Standing balance comment: Heavy dependence and cues to stand up straight and breathe                   ADL Overall ADL's : Needs assistance/impaired     Grooming: Wash/dry face;Oral care;Brushing hair;Min guard;Sitting                   Toilet Transfer: Minimal assistance;Ambulation;BSC Toilet Transfer Details (indicate cue type and reason): Able to transfer to [facility] hand held assist due to urgency with OT maintaining precautions (donned TSLO sitting EOB) Toileting- Clothing Manipulation and Hygiene: Maximal assistance;Sit to/from stand       Functional mobility during ADLs: Minimal assistance;Rolling walker General ADL Comments: Pt able to ambulate with hand held assist to [facility] due to urgency. TSLO applied sitting EOB by OT prior to ambulation      Vision                     Perception     Praxis      Cognition   Behavior During Therapy: [facility] for tasks assessed/performed Overall Cognitive Status: Within Functional Limits for tasks assessed       Memory: Decreased recall of precautions (Able to remember 2/3 of her precautions vc for 3rd)               Extremity/Trunk Assessment               Exercises     Shoulder Instructions       General Comments      Pertinent Vitals/ Pain       Pain Assessment: 0-10 Pain Score: 8  Faces Pain Scale: Hurts whole lot Pain Location: Back Pain Descriptors / Indicators: Aching;Moaning;Guarding;Grimacing;Sore;Throbbing Pain Intervention(s): Monitored during session;Patient requesting pain meds-RN notified;Repositioned  Home Living                                          Prior Functioning/Environment              Frequency Min 3X/week     Progress Toward Goals  OT Goals(current goals can now be found in the care plan section)  Progress towards OT goals: Progressing toward goals  Acute Rehab OT Goals Patient Stated Goal:  Be home with husband. OT Goal Formulation: With patient Time For Goal Achievement: 12/15/15 Potential to Achieve Goals: Good ADL Goals Pt Will Transfer to Toilet: with supervision;ambulating;bedside commode Pt Will Perform Toileting - Clothing Manipulation and hygiene: with min assist;with caregiver independent in assisting;sit to/from stand Additional ADL Goal #1: Pt will recall 3 out of 3 back precautions. Additional ADL Goal #2: Pt will don/doff back brace with max assistance with caregiver independent in assisting.  Plan Discharge plan remains appropriate    Co-evaluation                 End of Session Equipment Utilized During Treatment: Gait belt;Rolling walker;Back brace   Activity Tolerance Patient tolerated treatment well   Patient Left in bed;with call bell/phone within reach   Nurse Communication Patient requests pain meds        Time: 1022-1057 OT Time Calculation (min): 35 min  Charges: OT General Charges $OT Visit: 1 Procedure OT Treatments $Self Care/Home Management : 23-37 mins  Merri Ray Sharnita Bogucki OTR/L 12/11/2015, 11:25 AM [phone]

## 2015-12-11 NOTE — Progress Notes (Signed)
     Subjective: 4 Days Post-Op Procedure(s) (LRB): Right L2-3 facetectomy, posterolateral fusion L2-3 with pedicle screws, rods, local bone graft, Vivigen cancellous chips (N/A)  Awake, alert and oriented x 4. Standing and walking short distances. Voiding with baseline incontinence. BM yesterday, not regular. Tolerating pos. Nausea intermittantly and seems to be chronic. GI saw and signed off with expected recovery of bowel function.   Patient reports pain as moderate.    Objective:   VITALS:  Temp:  [98.4 F (36.9 C)-98.7 F (37.1 C)] 98.4 F (36.9 C) (08/04 0530) Pulse Rate:  [75-80] 78 (08/04 0530) Resp:  [16] 16 (08/04 0530) BP: (131-139)/(57-75) 134/62 (08/04 0530) SpO2:  [96 %-98 %] 96 % (08/04 0530)  Neurologically intact ABD soft Neurovascular intact Sensation intact distally Intact pulses distally Dorsiflexion/Plantar flexion intact Incision: no drainage   LABS  Recent Labs  12/09/15 0847 12/10/15 0348  HGB 11.2* 11.4*  WBC 15.8* 17.2*  PLT 208 186    Recent Labs  12/09/15 1426 12/10/15 0348  NA 130* 131*  K 3.6 3.7  CL 99* 99*  CO2 21* 23  BUN 10 9  CREATININE 0.69 0.59  GLUCOSE 115* 122*   No results for input(s): LABPT, INR in the last 72 hours.   Assessment/Plan: 4 Days Post-Op Procedure(s) (LRB): Right L2-3 facetectomy, posterolateral fusion L2-3 with pedicle screws, rods, local bone graft, Vivigen cancellous chips (N/A)  Advance diet Up with therapy Discharge home with home health today following OT/PT at noon. Oxycodone/oxycontin for pain. No acetameniphen due to LFT elevation in past. C/o mild cough, previous CXR negative.  NITKA,JAMES E 12/11/2015, 8:24 AM

## 2015-12-11 NOTE — Plan of Care (Signed)
Problem: Tissue Perfusion: Goal: Risk factors for ineffective tissue perfusion will decrease Outcome: Progressing No S/S of DVT noted  Problem: Activity: Goal: Ability to tolerate increased activity will improve Outcome: Progressing Turning hersel in the bed witout difficulty Goal: Will remain free from falls Outcome: Progressing No fall or injury noted this shift, fall prevention and safety precaution maintained  Problem: Bowel/Gastric: Goal: Gastrointestinal status for postoperative course will improve Outcome: Progressing Passing gas, had a BM last night, nausea resolved, no vomiting noted  Problem: Pain Management: Goal: Pain level will decrease Outcome: Progressing Medicated once with PRN and schedule pain medications with full relief, resting in the bed with eyes closed at present time, denies pain.  Problem: Bladder/Genitourinary: Goal: Urinary functional status for postoperative course will improve Outcome: Progressing No bladder issues

## 2015-12-11 NOTE — Progress Notes (Addendum)
Physical Therapy Treatment Patient Details Name: [name] [name] MRN: [id number] DOB: 1936/03/28 Today's Date: 12/11/2015    History of Present Illness pt presents after L 2-3 Facetectomy and PLIF.  pt with hx of Anxiety, CAD, Chronic Back Pain, Vertigo, HTN, and TIA.      PT Comments    Pt in significant more pain than previous session.  Pt is requiring increased assist and appears unsteady/  Pt would benefit from repeated session in am.  Will continue efforts per POC pending d/c.    Follow Up Recommendations  Home health PT;Supervision/Assistance - 24 hour     Equipment Recommendations  None recommended by PT    Recommendations for Other Services       Precautions / Restrictions Precautions Precautions: Fall;Back Precaution Booklet Issued: Yes (comment) Precaution Comments: Pt provided with back handout (BAT) precautions Required Braces or Orthoses: Spinal Brace Spinal Brace: Thoracolumbosacral orthotic Restrictions Weight Bearing Restrictions: No    Mobility  Bed Mobility Overal bed mobility: Needs Assistance Bed Mobility: Rolling;Sidelying to Sit Rolling: Min guard Sidelying to sit: Min guard     Sit to sidelying: Min assist General bed mobility comments: Cues for technique to maintain back precautions.    Transfers Overall transfer level: Needs assistance Equipment used: Rolling walker (2 wheeled) Transfers: Sit to/from Stand Sit to Stand: Mod assist (required increased assist secondary to pain.  )         General transfer comment: Pt presents with posterior lean due to guarding.  Pt very painful and tearful which distracts from maintaining safety with movement.  Pt assist for forward weight shifting and boosting.    Ambulation/Gait Ambulation/Gait assistance: Min assist Ambulation Distance (Feet): 10 Feet (x2) Assistive device: Rolling walker (2 wheeled) Gait Pattern/deviations: Step-to pattern;Shuffle;Antalgic   Gait velocity interpretation: <1.8  ft/sec, indicative of risk for recurrent falls General Gait Details: unsteady instability noted with shaky LEs.  Pt required cues for knee extension and safety with RW position.     Stairs            Wheelchair Mobility    Modified Rankin (Stroke Patients Only)       Balance Overall balance assessment: Needs assistance Sitting-balance support: Single extremity supported Sitting balance-Leahy Scale: Fair Sitting balance - Comments: pain increases with sitting impacting balance as Pt guards - even with brace   Standing balance support: Bilateral upper extremity supported Standing balance-Leahy Scale: Poor Standing balance comment: Heavy dependence and cues to stand up straight and breathe                    Cognition Arousal/Alertness: Awake/alert Behavior During Therapy: WFL for tasks assessed/performed Overall Cognitive Status: Within Functional Limits for tasks assessed       Memory: Decreased recall of precautions (Pt required cues to recall 3/3, able to recall 2/3)              Exercises      General Comments        Pertinent Vitals/Pain Pain Assessment: 0-10 Pain Score: 10-Worst pain ever Faces Pain Scale: Hurts whole lot Pain Location: Back Pain Descriptors / Indicators: Crying;Discomfort;Grimacing;Guarding;Sore;Operative site guarding Pain Intervention(s): Monitored during session;RN gave pain meds during session;Repositioned    Home Living                      Prior Function            PT Goals (current goals can now be found in the  care plan section) Acute Rehab PT Goals Patient Stated Goal: Be home with husband. Potential to Achieve Goals: Good Progress towards PT goals: Not progressing toward goals - comment (required increased assistance secondary to pain.  )    Frequency  Min 5X/week    PT Plan Current plan remains appropriate    Co-evaluation             End of Session Equipment Utilized During Treatment:  Gait belt;Back brace Activity Tolerance: Patient limited by pain Patient left: in chair;with call bell/phone within reach;with family/visitor present     Time: [id number] PT Time Calculation (min) (ACUTE ONLY): 26 min  Charges:  $Gait Training: 8-22 mins $Therapeutic Activity: 8-22 mins                    G Codes:      Cristela Blue 01/10/16, 11:49 AM  Governor Rooks, PTA pager [phone]

## 2015-12-11 NOTE — Progress Notes (Signed)
Reviewed discharge papers with family with full understanding

## 2015-12-12 DIAGNOSIS — S32030D Wedge compression fracture of third lumbar vertebra, subsequent encounter for fracture with routine healing: Secondary | ICD-10-CM | POA: Diagnosis not present

## 2015-12-12 DIAGNOSIS — K219 Gastro-esophageal reflux disease without esophagitis: Secondary | ICD-10-CM | POA: Diagnosis not present

## 2015-12-12 DIAGNOSIS — S32020D Wedge compression fracture of second lumbar vertebra, subsequent encounter for fracture with routine healing: Secondary | ICD-10-CM | POA: Diagnosis not present

## 2015-12-12 DIAGNOSIS — E785 Hyperlipidemia, unspecified: Secondary | ICD-10-CM | POA: Diagnosis not present

## 2015-12-12 DIAGNOSIS — F419 Anxiety disorder, unspecified: Secondary | ICD-10-CM | POA: Diagnosis not present

## 2015-12-12 DIAGNOSIS — I2510 Atherosclerotic heart disease of native coronary artery without angina pectoris: Secondary | ICD-10-CM | POA: Diagnosis not present

## 2015-12-12 DIAGNOSIS — J45909 Unspecified asthma, uncomplicated: Secondary | ICD-10-CM | POA: Diagnosis not present

## 2015-12-15 ENCOUNTER — Telehealth: Payer: Self-pay | Admitting: Internal Medicine

## 2015-12-15 NOTE — Telephone Encounter (Signed)
Called to schedule hospital follow up. Patient unable to leave residence due to pain, nausea, and she is not very mobile yet. I will check back Friday 12-18-15.

## 2015-12-18 ENCOUNTER — Telehealth (HOSPITAL_COMMUNITY): Payer: Self-pay | Admitting: *Deleted

## 2015-12-18 ENCOUNTER — Encounter: Payer: Self-pay | Admitting: Gastroenterology

## 2015-12-18 NOTE — Telephone Encounter (Signed)
Kim with [facility] left a voice message requesting an order for a chest x ray to rule out possible pneumonia in patient.  Patient has not been seen here in over a year and Dr.McLean would need to be the doctor to sign that order because he saw her last. Dr.McLean is out of the office until next week. Left her a voice message explaining all of this also advised her to contact patients PCP if she needed something done before Dr.McLean returned to the office

## 2015-12-21 ENCOUNTER — Telehealth: Payer: Self-pay | Admitting: Internal Medicine

## 2015-12-21 NOTE — Telephone Encounter (Signed)
Daughter states, Mom is improving, moving around, Home health RN saw patient today. She still has not gone out of the house. Will tell [name] to call and schedule with Dr Melford Aase this week.

## 2015-12-23 ENCOUNTER — Inpatient Hospital Stay (HOSPITAL_COMMUNITY)
Admission: EM | Admit: 2015-12-23 | Discharge: 2015-12-26 | DRG: 392 | Disposition: A | Discharge status: Home / Self Care | Payer: PPO | Attending: Family Medicine | Admitting: Family Medicine

## 2015-12-23 ENCOUNTER — Emergency Department (HOSPITAL_COMMUNITY): Payer: PPO

## 2015-12-23 ENCOUNTER — Encounter (HOSPITAL_COMMUNITY): Payer: Self-pay

## 2015-12-23 ENCOUNTER — Telehealth: Payer: Self-pay | Admitting: *Deleted

## 2015-12-23 DIAGNOSIS — Z9841 Cataract extraction status, right eye: Secondary | ICD-10-CM | POA: Diagnosis not present

## 2015-12-23 DIAGNOSIS — R1084 Generalized abdominal pain: Secondary | ICD-10-CM | POA: Diagnosis not present

## 2015-12-23 DIAGNOSIS — G894 Chronic pain syndrome: Secondary | ICD-10-CM | POA: Diagnosis present

## 2015-12-23 DIAGNOSIS — K210 Gastro-esophageal reflux disease with esophagitis: Secondary | ICD-10-CM | POA: Diagnosis not present

## 2015-12-23 DIAGNOSIS — R1110 Vomiting, unspecified: Secondary | ICD-10-CM

## 2015-12-23 DIAGNOSIS — I2583 Coronary atherosclerosis due to lipid rich plaque: Secondary | ICD-10-CM | POA: Diagnosis not present

## 2015-12-23 DIAGNOSIS — K209 Esophagitis, unspecified without bleeding: Secondary | ICD-10-CM

## 2015-12-23 DIAGNOSIS — R1310 Dysphagia, unspecified: Secondary | ICD-10-CM | POA: Diagnosis not present

## 2015-12-23 DIAGNOSIS — R1111 Vomiting without nausea: Secondary | ICD-10-CM | POA: Diagnosis not present

## 2015-12-23 DIAGNOSIS — Z8673 Personal history of transient ischemic attack (TIA), and cerebral infarction without residual deficits: Secondary | ICD-10-CM | POA: Diagnosis not present

## 2015-12-23 DIAGNOSIS — Z7951 Long term (current) use of inhaled steroids: Secondary | ICD-10-CM | POA: Diagnosis not present

## 2015-12-23 DIAGNOSIS — R110 Nausea: Secondary | ICD-10-CM

## 2015-12-23 DIAGNOSIS — I10 Essential (primary) hypertension: Secondary | ICD-10-CM | POA: Diagnosis not present

## 2015-12-23 DIAGNOSIS — F3341 Major depressive disorder, recurrent, in partial remission: Secondary | ICD-10-CM | POA: Diagnosis not present

## 2015-12-23 DIAGNOSIS — F324 Major depressive disorder, single episode, in partial remission: Secondary | ICD-10-CM | POA: Diagnosis not present

## 2015-12-23 DIAGNOSIS — E785 Hyperlipidemia, unspecified: Secondary | ICD-10-CM | POA: Diagnosis not present

## 2015-12-23 DIAGNOSIS — M549 Dorsalgia, unspecified: Secondary | ICD-10-CM | POA: Diagnosis not present

## 2015-12-23 DIAGNOSIS — Z9842 Cataract extraction status, left eye: Secondary | ICD-10-CM

## 2015-12-23 DIAGNOSIS — Z90710 Acquired absence of both cervix and uterus: Secondary | ICD-10-CM

## 2015-12-23 DIAGNOSIS — J45909 Unspecified asthma, uncomplicated: Secondary | ICD-10-CM | POA: Diagnosis present

## 2015-12-23 DIAGNOSIS — R112 Nausea with vomiting, unspecified: Secondary | ICD-10-CM

## 2015-12-23 DIAGNOSIS — F419 Anxiety disorder, unspecified: Secondary | ICD-10-CM | POA: Diagnosis present

## 2015-12-23 DIAGNOSIS — M810 Age-related osteoporosis without current pathological fracture: Secondary | ICD-10-CM | POA: Diagnosis not present

## 2015-12-23 DIAGNOSIS — Z888 Allergy status to other drugs, medicaments and biological substances status: Secondary | ICD-10-CM | POA: Diagnosis not present

## 2015-12-23 DIAGNOSIS — R42 Dizziness and giddiness: Secondary | ICD-10-CM | POA: Diagnosis not present

## 2015-12-23 DIAGNOSIS — K5909 Other constipation: Secondary | ICD-10-CM | POA: Diagnosis present

## 2015-12-23 DIAGNOSIS — Z7984 Long term (current) use of oral hypoglycemic drugs: Secondary | ICD-10-CM

## 2015-12-23 DIAGNOSIS — R7303 Prediabetes: Secondary | ICD-10-CM

## 2015-12-23 DIAGNOSIS — Z79899 Other long term (current) drug therapy: Secondary | ICD-10-CM

## 2015-12-23 DIAGNOSIS — Z881 Allergy status to other antibiotic agents status: Secondary | ICD-10-CM

## 2015-12-23 DIAGNOSIS — Z981 Arthrodesis status: Secondary | ICD-10-CM

## 2015-12-23 DIAGNOSIS — Z955 Presence of coronary angioplasty implant and graft: Secondary | ICD-10-CM

## 2015-12-23 DIAGNOSIS — Z961 Presence of intraocular lens: Secondary | ICD-10-CM | POA: Diagnosis present

## 2015-12-23 DIAGNOSIS — Z885 Allergy status to narcotic agent status: Secondary | ICD-10-CM | POA: Diagnosis not present

## 2015-12-23 DIAGNOSIS — J4520 Mild intermittent asthma, uncomplicated: Secondary | ICD-10-CM | POA: Diagnosis present

## 2015-12-23 DIAGNOSIS — B370 Candidal stomatitis: Secondary | ICD-10-CM | POA: Diagnosis not present

## 2015-12-23 DIAGNOSIS — E119 Type 2 diabetes mellitus without complications: Secondary | ICD-10-CM | POA: Diagnosis not present

## 2015-12-23 DIAGNOSIS — I2510 Atherosclerotic heart disease of native coronary artery without angina pectoris: Secondary | ICD-10-CM | POA: Diagnosis not present

## 2015-12-23 DIAGNOSIS — K219 Gastro-esophageal reflux disease without esophagitis: Secondary | ICD-10-CM | POA: Diagnosis not present

## 2015-12-23 DIAGNOSIS — R030 Elevated blood-pressure reading, without diagnosis of hypertension: Secondary | ICD-10-CM | POA: Diagnosis not present

## 2015-12-23 DIAGNOSIS — K5904 Chronic idiopathic constipation: Secondary | ICD-10-CM | POA: Diagnosis present

## 2015-12-23 LAB — COMPREHENSIVE METABOLIC PANEL
ALT: 24 U/L (ref 14–54)
AST: 19 U/L (ref 15–41)
Albumin: 3.6 g/dL (ref 3.5–5.0)
Alkaline Phosphatase: 126 U/L (ref 38–126)
Anion gap: 10 (ref 5–15)
BUN: 7 mg/dL (ref 6–20)
CO2: 22 mmol/L (ref 22–32)
Calcium: 9.1 mg/dL (ref 8.9–10.3)
Chloride: 103 mmol/L (ref 101–111)
Creatinine, Ser: 0.73 mg/dL (ref 0.44–1.00)
GFR calc Af Amer: >60 mL/min (ref >60)
GFR calc non Af Amer: >60 mL/min (ref >60)
Glucose, Bld: 137 mg/dL — ABNORMAL HIGH (ref 65–99)
Potassium: 3.9 mmol/L (ref 3.5–5.1)
Sodium: 135 mmol/L (ref 135–145)
Total Bilirubin: 0.8 mg/dL (ref 0.3–1.2)
Total Protein: 6.9 g/dL (ref 6.5–8.1)

## 2015-12-23 LAB — CBC
HCT: 40.8 % (ref 36.0–46.0)
Hemoglobin: 13.3 g/dL (ref 12.0–15.0)
MCH: 31.1 pg (ref 26.0–34.0)
MCHC: 32.6 g/dL (ref 30.0–36.0)
MCV: 95.3 fL (ref 78.0–100.0)
Platelets: 554 10*3/uL — ABNORMAL HIGH (ref 150–400)
RBC: 4.28 MIL/uL (ref 3.87–5.11)
RDW: 13.1 % (ref 11.5–15.5)
WBC: 7.9 10*3/uL (ref 4.0–10.5)

## 2015-12-23 LAB — GLUCOSE, CAPILLARY: Glucose-Capillary: 98 mg/dL (ref 65–99)

## 2015-12-23 LAB — LIPASE, BLOOD: Lipase: 13 U/L (ref 11–51)

## 2015-12-23 MED ORDER — PROMETHAZINE HCL 25 MG/ML IJ SOLN
12.5000 mg | Freq: Once | INTRAMUSCULAR | Status: AC
  Administered 2015-12-23: 12.5 mg via INTRAVENOUS
  Filled 2015-12-23: qty 1

## 2015-12-23 MED ORDER — AMLODIPINE BESYLATE 10 MG PO TABS
10.0000 mg | ORAL_TABLET | Freq: Every day | ORAL | Status: DC
  Administered 2015-12-23 – 2015-12-25 (×3): 10 mg via ORAL
  Filled 2015-12-23 (×3): qty 1

## 2015-12-23 MED ORDER — ENOXAPARIN SODIUM 40 MG/0.4ML ~~LOC~~ SOLN
40.0000 mg | SUBCUTANEOUS | Status: DC
  Administered 2015-12-23: 40 mg via SUBCUTANEOUS
  Filled 2015-12-23: qty 0.4

## 2015-12-23 MED ORDER — ALPRAZOLAM 0.5 MG PO TABS
0.5000 mg | ORAL_TABLET | Freq: Two times a day (BID) | ORAL | Status: DC | PRN
  Administered 2015-12-23 – 2015-12-25 (×3): 0.5 mg via ORAL
  Filled 2015-12-23 (×3): qty 1

## 2015-12-23 MED ORDER — GABAPENTIN 100 MG PO CAPS
100.0000 mg | ORAL_CAPSULE | Freq: Three times a day (TID) | ORAL | Status: DC
  Administered 2015-12-23 – 2015-12-26 (×7): 100 mg via ORAL
  Filled 2015-12-23 (×8): qty 1

## 2015-12-23 MED ORDER — SENNOSIDES-DOCUSATE SODIUM 8.6-50 MG PO TABS: 1.0000 | ORAL_TABLET | Freq: Every evening | ORAL | Status: DC | PRN

## 2015-12-23 MED ORDER — MAGNESIUM CITRATE PO SOLN: 1.0000 | Freq: Once | ORAL | Status: DC | PRN

## 2015-12-23 MED ORDER — LOSARTAN POTASSIUM 50 MG PO TABS
100.0000 mg | ORAL_TABLET | Freq: Every day | ORAL | Status: DC
  Administered 2015-12-24 – 2015-12-26 (×2): 100 mg via ORAL
  Filled 2015-12-23 (×2): qty 2

## 2015-12-23 MED ORDER — ALBUTEROL SULFATE HFA 108 (90 BASE) MCG/ACT IN AERS: 1.0000 | INHALATION_SPRAY | Freq: Four times a day (QID) | RESPIRATORY_TRACT | Status: DC | PRN

## 2015-12-23 MED ORDER — ONDANSETRON HCL 4 MG/2ML IJ SOLN
4.0000 mg | Freq: Once | INTRAMUSCULAR | Status: AC
  Administered 2015-12-23: 4 mg via INTRAVENOUS
  Filled 2015-12-23: qty 2

## 2015-12-23 MED ORDER — SODIUM CHLORIDE 0.9 % IV BOLUS (SEPSIS)
500.0000 mL | Freq: Once | INTRAVENOUS | Status: AC
  Administered 2015-12-23: 500 mL via INTRAVENOUS

## 2015-12-23 MED ORDER — PANTOPRAZOLE SODIUM 40 MG PO TBEC
40.0000 mg | DELAYED_RELEASE_TABLET | Freq: Once | ORAL | Status: DC
  Filled 2015-12-23: qty 1

## 2015-12-23 MED ORDER — FLUCONAZOLE IN SODIUM CHLORIDE 100-0.9 MG/50ML-% IV SOLN
100.0000 mg | INTRAVENOUS | Status: DC
  Administered 2015-12-23 – 2015-12-25 (×3): 100 mg via INTRAVENOUS
  Filled 2015-12-23 (×5): qty 50

## 2015-12-23 MED ORDER — ACETAMINOPHEN 325 MG PO TABS
650.0000 mg | ORAL_TABLET | Freq: Four times a day (QID) | ORAL | Status: DC | PRN
  Administered 2015-12-26: 650 mg via ORAL
  Filled 2015-12-23: qty 2

## 2015-12-23 MED ORDER — GABAPENTIN 300 MG PO CAPS
300.0000 mg | ORAL_CAPSULE | Freq: Every day | ORAL | Status: DC
  Administered 2015-12-23 – 2015-12-25 (×3): 300 mg via ORAL
  Filled 2015-12-23 (×3): qty 1

## 2015-12-23 MED ORDER — IOPAMIDOL (ISOVUE-300) INJECTION 61%
100.0000 mL | Freq: Once | INTRAVENOUS | Status: AC | PRN
  Administered 2015-12-23: 100 mL via INTRAVENOUS

## 2015-12-23 MED ORDER — ONDANSETRON HCL 4 MG/2ML IJ SOLN
4.0000 mg | Freq: Four times a day (QID) | INTRAMUSCULAR | Status: DC | PRN
  Administered 2015-12-25 (×3): 4 mg via INTRAVENOUS
  Filled 2015-12-23 (×2): qty 2

## 2015-12-23 MED ORDER — SODIUM CHLORIDE 0.9 % IV SOLN
INTRAVENOUS | Status: DC
  Administered 2015-12-23 – 2015-12-24 (×2): via INTRAVENOUS

## 2015-12-23 MED ORDER — BISACODYL 10 MG RE SUPP: 10.0000 mg | Freq: Every day | RECTAL | Status: DC | PRN

## 2015-12-23 MED ORDER — FAMOTIDINE IN NACL 20-0.9 MG/50ML-% IV SOLN
20.0000 mg | Freq: Once | INTRAVENOUS | Status: AC
  Administered 2015-12-23: 20 mg via INTRAVENOUS
  Filled 2015-12-23: qty 50

## 2015-12-23 MED ORDER — ONDANSETRON HCL 4 MG/2ML IJ SOLN
4.0000 mg | Freq: Once | INTRAMUSCULAR | Status: AC
Start: 2015-12-23 — End: 2015-12-23
  Administered 2015-12-23: 4 mg via INTRAVENOUS
  Filled 2015-12-23: qty 2

## 2015-12-23 MED ORDER — ACETAMINOPHEN 650 MG RE SUPP
650.0000 mg | Freq: Four times a day (QID) | RECTAL | Status: DC | PRN
Start: 2015-12-23 — End: 2015-12-26

## 2015-12-23 MED ORDER — ONDANSETRON HCL 4 MG/2ML IJ SOLN: 4.0000 mg | Freq: Three times a day (TID) | INTRAMUSCULAR | Status: DC | PRN

## 2015-12-23 MED ORDER — FENTANYL CITRATE (PF) 100 MCG/2ML IJ SOLN
100.0000 ug | Freq: Once | INTRAMUSCULAR | Status: AC
  Administered 2015-12-23: 100 ug via INTRAVENOUS
  Filled 2015-12-23: qty 2

## 2015-12-23 MED ORDER — PRAVASTATIN SODIUM 40 MG PO TABS
40.0000 mg | ORAL_TABLET | Freq: Every day | ORAL | Status: DC
  Administered 2015-12-24 – 2015-12-26 (×2): 40 mg via ORAL
  Filled 2015-12-23 (×2): qty 1

## 2015-12-23 MED ORDER — KETOROLAC TROMETHAMINE 15 MG/ML IJ SOLN
15.0000 mg | Freq: Four times a day (QID) | INTRAMUSCULAR | Status: DC | PRN
  Administered 2015-12-23 – 2015-12-26 (×6): 15 mg via INTRAVENOUS
  Filled 2015-12-23 (×6): qty 1

## 2015-12-23 MED ORDER — ONDANSETRON HCL 4 MG PO TABS: 4.0000 mg | ORAL_TABLET | Freq: Four times a day (QID) | ORAL | Status: DC | PRN

## 2015-12-23 MED ORDER — TRAZODONE HCL 50 MG PO TABS
25.0000 mg | ORAL_TABLET | Freq: Every evening | ORAL | Status: DC | PRN
  Administered 2015-12-23 – 2015-12-26 (×2): 25 mg via ORAL
  Filled 2015-12-23 (×2): qty 1

## 2015-12-23 MED ORDER — SODIUM CHLORIDE 0.9 % IV SOLN: INTRAVENOUS | Status: DC

## 2015-12-23 MED ORDER — METOPROLOL TARTRATE 12.5 MG HALF TABLET
12.5000 mg | ORAL_TABLET | Freq: Two times a day (BID) | ORAL | Status: DC
  Administered 2015-12-23 – 2015-12-26 (×5): 12.5 mg via ORAL
  Filled 2015-12-23 (×5): qty 1

## 2015-12-23 MED ORDER — IPRATROPIUM-ALBUTEROL 0.5-2.5 (3) MG/3ML IN SOLN: 3.0000 mL | Freq: Four times a day (QID) | RESPIRATORY_TRACT | Status: DC | PRN

## 2015-12-23 MED ORDER — INSULIN ASPART 100 UNIT/ML ~~LOC~~ SOLN
0.0000 [IU] | Freq: Three times a day (TID) | SUBCUTANEOUS | Status: DC
  Administered 2015-12-26: 1 [IU] via SUBCUTANEOUS

## 2015-12-23 MED ORDER — POLYETHYLENE GLYCOL 3350 17 G PO PACK
17.0000 g | PACK | Freq: Two times a day (BID) | ORAL | Status: DC
  Administered 2015-12-24 – 2015-12-26 (×3): 17 g via ORAL
  Filled 2015-12-23 (×5): qty 1

## 2015-12-23 NOTE — ED Notes (Signed)
Pt. Family called out because patient still dry heaving. EDP made aware and gave verbal order for 12.5mg  phenergan.

## 2015-12-23 NOTE — ED Notes (Signed)
Pt. Transported to CT at this time.  

## 2015-12-23 NOTE — ED Provider Notes (Signed)
[facility] DEPT Provider Note   CSN: [id number] Arrival date & time: 12/23/15  1202  By signing my name below, I, Evelene Croon, attest that this documentation has been prepared under the direction and in the presence of Virgel Manifold, MD . Electronically Signed: Evelene Croon, Scribe. 12/23/2015. 12:44 PM.    History   Chief Complaint Chief Complaint  Patient presents with  . Emesis    The history is provided by the patient. No language interpreter was used.    HPI Comments:  [name] [name] is a 80 y.o. female who presents to the Emergency Department complaining of persistent nausea with associated vomiting. She states she has been vomiting since last night. Pt had back surgery on 12/07/15; states she has been nauseous since.  She also notes diffuse abdominal pain. She denies diarrhea. She has had a BM since surgery.   Orthopedist- Nitka; has follow up scheduled for tomorrow.   Past Medical History:  Diagnosis Date  . Anxiety    takes Xanax daily as needed  . Asthma    Albuterol daily as needed  . CAD (coronary artery disease)    LHC 2003 with 40% pLAD, 30% mLAD, 100% D1 (moderate vessel), 100%  D2 (small vessel), 95% mid small OM2.  Pt had PTCA to D1;  D2 and OM2 small vessels and tx medically;  Last Myoview (2012): anterior infarct seen with scar, no ischemia. EF normal. Pt managed medically;  Lexiscan Myoview (12/14):  Low risk; ant scar with very mild peri-infarct ischemia; EF 55% with ant AK   . Chronic back pain    HNP, DDD, spinal stenosis, vertebral compression fractures.   . Chronic nausea    takes Zofran daily as needed.  normal gastric emptying study 03/2013  . Constipation    felt to be functional. takes Senokot and Miralax daily.  treated with Linzess in past.   . DIVERTICULOSIS, COLON 08/28/2008   Qualifier: Diagnosis of  By: [name], [name], [name]    . Elevated LFTs 2015   probably med induced DILI, from Augmentin vs Pravastatin  . GERD (gastroesophageal  reflux disease)    hx of esophageal stricture   . Hiatal hernia    Paraesophageal hernias. s/p fundoplications in 0000000 and 04/2013  . History of bronchitis several of yrs ago  . History of colon polyps 03/2009, 03/2011   adenomatous colon polyps, no high grade dysplasia.   [name] History of vertigo    no meds  . Hyperlipidemia    takes Pravastatin daily  . Hypertension    takes Amlodipine,Metoprolol,and Losartan daily  . Osteoporosis   . PONV (postoperative nausea and vomiting)    yrs ago  . Slow urinary stream    takes Rapaflo daily  . TIA (transient ischemic attack) 1995   no residual effects noted    Patient Active Problem List   Diagnosis Date Noted  . Ileus ([place])   . Acute right lumbar radiculopathy 12/07/2015    Class: Chronic  . Other forms of scoliosis, lumbar region 12/07/2015    Class: Chronic  . Spinal stenosis of lumbar region with neurogenic claudication 12/07/2015  . HNP (herniated nucleus pulposus), lumbar 05/29/2015    Class: Chronic  . Spinal stenosis, lumbar region, with neurogenic claudication 05/29/2015  . DDD (degenerative disc disease), lumbar 04/21/2015  . Encounter for Medicare annual wellness exam 02/27/2015  . Mixed hyperlipidemia 11/24/2014  . GERD  11/24/2014  . Depression, major, in partial remission ([place]) 09/26/2014  . Chronic pain syndrome 09/26/2014  .  Coronary artery disease due to lipid rich plaque   . Osteoporosis 02/10/2014  . Prediabetes 08/14/2013  . Vitamin D deficiency 08/14/2013  . Medication management 08/14/2013  . Constipation, chronic 04/03/2013  . MGUS (monoclonal gammopathy of unknown significance) 03/05/2013  . Herniated lumbar intervertebral disc 06/25/2012    Class: Acute  . Vertebral compression fracture ([place]) 06/23/2012  . ESOPHAGEAL STRICTURE 08/28/2008  . Asthma 07/29/2008  . Incarcerated recurrent paraesophageal hiatal hernia s/p lap redo repair [id number] 01/31/2008  . Essential hypertension 07/25/2007    Past  Surgical History:  Procedure Laterality Date  . APPENDECTOMY     patient unsure of date  . BACK SURGERY    . BLADDER REPAIR    . CHOLECYSTECTOMY     Patient unsure of date.  . CORONARY ANGIOPLASTY  11/16/2001   1 stent  . ESOPHAGEAL MANOMETRY N/A 03/25/2013   Procedure: ESOPHAGEAL MANOMETRY (EM);  Surgeon: Inda Castle, MD;  Location: [facility] END[facility]COPY;  Service: Endoscopy;  Laterality: N/A;  . ESOPHAGOGASTRODUODENOSCOPY N/A 03/15/2013   Procedure: ESOPHAGOGASTRODUODENOSCOPY (EGD);  Surgeon: Jerene Bears, MD;  Location: [facility] ENDOSCOPY;  Service: Endoscopy;  Laterality: N/A;  . EYE SURGERY  11/2000   bilateral cataracts with lens implant  . FIXATION KYPHOPLASTY LUMBAR SPINE  X 2   "L3-4"  . HEMORRHOID SURGERY    . HIATAL HERNIA REPAIR  06/03/2011   Procedure: LAPAROSCOPIC REPAIR OF HIATAL HERNIA;  Surgeon: Adin Hector, MD;  Location: [facility] ORS;  Service: General;  Laterality: N/A;  . HIATAL HERNIA REPAIR N/A 04/24/2013   Procedure: LAPAROSCOPIC  REPAIR RECURRENT PARASOPHAGEAL HIATAL HERNIA WITH FUNDOPLICATION;  Surgeon: Adin Hector, MD;  Location: [facility] ORS;  Service: General;  Laterality: N/A;  . INSERTION OF MESH N/A 04/24/2013   Procedure: INSERTION OF MESH;  Surgeon: Adin Hector, MD;  Location: [facility] ORS;  Service: General;  Laterality: N/A;  . LAPAROSCOPIC LYSIS OF ADHESIONS N/A 04/24/2013   Procedure: LAPAROSCOPIC LYSIS OF ADHESIONS;  Surgeon: Adin Hector, MD;  Location: [facility] ORS;  Service: General;  Laterality: N/A;  . LAPAROSCOPIC NISSEN FUNDOPLICATION  [id number]   Procedure: LAPAROSCOPIC NISSEN FUNDOPLICATION;  Surgeon: Adin Hector, MD;  Location: [facility] ORS;  Service: General;  Laterality: N/A;  . LUMBAR FUSION  12/07/2015   Right L2-3 facetectomy, posterolateral fusion L2-3 with pedicle screws, rods, local bone graft, Vivigen cancellous chips. Fusion extended to the L1 level with pedicle screws and rods  . LUMBAR LAMINECTOMY/DECOMPRESSION MICRODISCECTOMY Right 06/26/2012    Procedure: LUMBAR LAMINECTOMY/DECOMPRESSION MICRODISCECTOMY;  Surgeon: Jessy Oto, MD;  Location: [facility];  Service: Orthopedics;  Laterality: Right;  RIGHT L4-5 MICRODISCECTOMY  . LUMBAR LAMINECTOMY/DECOMPRESSION MICRODISCECTOMY N/A 05/29/2015   Procedure: RIGHT L2-3 MICRODISCECTOMY;  Surgeon: Jessy Oto, MD;  Location: [facility];  Service: Orthopedics;  Laterality: N/A;  . TOTAL ABDOMINAL HYSTERECTOMY  1975   partial    OB History    No data available       Home Medications    Prior to Admission medications   Medication Sig Start Date End Date Taking? Authorizing Provider  albuterol (PROVENTIL HFA;VENTOLIN HFA) 108 (90 BASE) MCG/ACT inhaler Inhale 1-2 puffs into the lungs every 6 (six) hours as needed for wheezing or shortness of breath.   Yes Historical Provider, MD  ALPRAZolam Duanne Moron) 0.5 MG tablet Take 1/2 to 1 tablet 2 or 3 x daily if needed for nerves 07/07/15  Yes [name], MD  amLODipine (NORVASC) 10 MG tablet Take 1 tablet (10 mg total)  by mouth at bedtime. Patient taking differently: Take 10 mg by mouth every morning.  09/20/14  Yes Reyne Dumas, MD  docusate sodium (COLACE) 250 MG capsule Take 1 capsule (250 mg total) by mouth 2 (two) times daily. 03/03/15  Yes Junius Creamer, NP  Cholecalciferol (VITAMIN D3) 2000 UNITS TABS Take 6,000 Units by mouth daily.     Historical Provider, MD  feeding supplement, GLUCERNA SHAKE, (GLUCERNA SHAKE) LIQD Take 237 mLs by mouth 3 (three) times daily between meals. 12/05/13   Shanker Kristeen Mans, MD  gabapentin (NEURONTIN) 100 MG capsule Take 1 capsule (100 mg total) by mouth 3 (three) times daily. 09/28/15 09/27/16  [name], MD  gabapentin (NEURONTIN) 300 MG capsule Take 300 mg by mouth at bedtime.    Historical Provider, MD  ipratropium-albuterol (DUONEB) 0.5-2.5 (3) MG/3ML SOLN USE ONE VIAL IN NEBULIZER 4 TIMES DAILY Patient taking differently: Inhale 3 mLs into the lungs every 6 (six) hours as needed. For shortness of breath 02/26/14    [name], MD  losartan (COZAAR) 100 MG tablet TAKE ONE TABLET BY MOUTH ONCE DAILY ( DISCONTINUE BENICAR) 09/30/15   Vicie Mutters, PA-C  metFORMIN (GLUCOPHAGE-XR) 500 MG 24 hr tablet TAKE ONE TO TWO TABLETS BY MOUTH TWICE DAILY WITH MEALS FOR DIABETES Patient not taking: Reported on 11/30/2015 04/03/15   [name], MD  metoprolol (LOPRESSOR) 50 MG tablet TAKE ONE TABLET BY MOUTH TWICE DAILY FOR BLOOD PRESSURE Patient taking differently: take 0.5 tablet by mouth at bedtime 02/27/14   [name], MD  mometasone-formoterol (DULERA) 200-5 MCG/ACT AERO Inhale 2 puffs into the lungs 2 (two) times daily. Patient taking differently: Inhale 2 puffs into the lungs 2 (two) times daily as needed for wheezing.  07/03/13   Kelby Aline, PA-C  nitroGLYCERIN (NITROSTAT) 0.4 MG SL tablet Place 1 tablet (0.4 mg total) under the tongue every 5 (five) minutes as needed for chest pain. 09/04/14   Larey Dresser, MD  ondansetron (ZOFRAN ODT) 4 MG disintegrating tablet Take 1 tablet (4 mg total) by mouth every 8 (eight) hours as needed for nausea or vomiting. 04/23/14   Carlisle Cater, PA-C  ondansetron (ZOFRAN ODT) 4 MG disintegrating tablet Take 1 tablet (4 mg total) by mouth every 8 (eight) hours as needed for nausea or vomiting. 12/11/15   Jessy Oto, MD  orphenadrine (NORFLEX) 100 MG tablet Take 1 tablet (100 mg total) by mouth 2 (two) times daily as needed for muscle spasms. 05/30/15   Jessy Oto, MD  OVER THE COUNTER MEDICATION Take 1,000 mg by mouth 2 (two) times daily. [place] Calcium 1000mg     Historical Provider, MD  OVER THE COUNTER MEDICATION Take 400 mg by mouth 2 (two) times daily. Magnesium Carbonate 400mg     Historical Provider, MD  oxyCODONE (OXY IR/ROXICODONE) 5 MG immediate release tablet Take 1 tablet (5 mg total) by mouth every 4 (four) hours as needed for severe pain or breakthrough pain. 12/11/15   Jessy Oto, MD  oxyCODONE (OXYCONTIN) 10 mg 12 hr tablet Take 1 tablet (10 mg total)  by mouth every 12 (twelve) hours. 12/11/15   Jessy Oto, MD  oxyCODONE-acetaminophen (PERCOCET/ROXICET) 5-325 MG tablet Take 1-2 tablets by mouth every 4 (four) hours as needed for moderate pain. 05/30/15   Jessy Oto, MD  pantoprazole (PROTONIX) 40 MG tablet Take 1 tablet (40 mg total) by mouth daily. 03/18/15   [name], MD  polyethylene glycol [facility] / Floria Raveling) packet Take 17 g by mouth  2 (two) times daily.    Historical Provider, MD  polyethylene glycol (MIRALAX / GLYCOLAX) packet Take 17 g by mouth 2 (two) times daily. 12/11/15   Jessy Oto, MD  potassium chloride SA (K-DUR,KLOR-CON) 20 MEQ tablet Take 1 tablet (20 mEq total) by mouth 2 (two) times daily. 11/25/14   [name], MD  pravastatin (PRAVACHOL) 40 MG tablet Take 1 tablet (40 mg total) by mouth daily. 07/09/15   [name], MD  predniSONE (DELTASONE) 20 MG tablet 3 tabs po daily x 3 days, then 2 tabs x 3 days, then 1.5 tabs x 3 days, then 1 tab x 3 days, then 0.5 tabs x 3 days 11/25/15   Starlyn Skeans, PA-C  prochlorperazine (COMPAZINE) 5 MG tablet Take 1 capsule 3 x day before meals only if needed for Nausea 09/26/14   [name], MD  senna (SENOKOT) 8.6 MG TABS tablet Take 1 tablet by mouth 2 (two) times daily.  12/05/13   Shanker Kristeen Mans, MD  silodosin (RAPAFLO) 8 MG CAPS capsule Take 8 mg by mouth every other day.     Historical Provider, MD  sucralfate (CARAFATE) 1 G tablet TAKE ONE TABLET BY MOUTH 4 TIMES DAILY BEFORE MEALS AND AT BEDTIME FOR HEARTBURN 10/13/14   Vicie Mutters, PA-C  tobramycin (TOBREX) 0.3 % ophthalmic ointment Place into both eyes 2 (two) times daily. Place a 1/2 inch ribbon of ointment into the lower eyelid. 11/25/15   Starlyn Skeans, PA-C    Family History Family History  Problem Relation Age of Onset  . Colon cancer Mother 7  . Heart disease Father 66    heart attack    Social History Social History  Substance Use Topics  . Smoking status: Never Smoker  . Smokeless  tobacco: Never Used  . Alcohol use No     Allergies   Ace inhibitors; Aspartame; Atorvastatin; Biaxin [clarithromycin]; Daliresp [roflumilast]; Erythromycin; Hydrocodone; and Levofloxacin   Review of Systems Review of Systems  Constitutional: Negative for chills and fever.  Respiratory: Negative for shortness of breath.   Cardiovascular: Negative for chest pain.  Gastrointestinal: Positive for abdominal pain, nausea and vomiting. Negative for diarrhea.  Musculoskeletal: Positive for back pain.  All other systems reviewed and are negative.    Physical Exam Updated Vital Signs BP 174/86 (BP Location: Right Arm)   Pulse 95   Temp 98.2 F (36.8 C) (Oral)   Resp 14   Ht 5\' 5"  (1.651 m)   Wt 146 lb (66.2 kg)   SpO2 98%   BMI 24.30 kg/m   Physical Exam  Constitutional: She is oriented to person, place, and time. She appears well-developed and well-nourished. No distress.  HENT:  Head: Normocephalic and atraumatic.  Eyes: Conjunctivae are normal.  Cardiovascular: Normal rate and regular rhythm.   Pulmonary/Chest: Effort normal and breath sounds normal. No respiratory distress.  Abdominal: Soft. She exhibits no distension. There is tenderness in the epigastric area. There is no rebound and no guarding.  Musculoskeletal:  Midline surgical site lower back looks like it's healing appropriately  Neurological: She is alert and oriented to person, place, and time.  Skin: Skin is warm and dry.  Psychiatric: She has a normal mood and affect.  Nursing note and vitals reviewed.    ED Treatments / Results  DIAGNOSTIC STUDIES:  Oxygen Saturation is 98% on RA, normal by my interpretation.    COORDINATION OF CARE:  12:18 PM Discussed treatment plan with pt at bedside and pt agreed to  plan.  Labs (all labs ordered are listed, but only abnormal results are displayed) Labs Reviewed  COMPREHENSIVE METABOLIC PANEL - Abnormal; Notable for the following:       Result Value    Glucose, Bld 137 (*)    All other components within normal limits  CBC - Abnormal; Notable for the following:    Platelets 554 (*)    All other components within normal limits  LIPASE, BLOOD    EKG  EKG Interpretation None       Radiology Ct Abdomen Pelvis W Contrast  Result Date: 12/23/2015 CLINICAL DATA:  Diffuse abdominal pain with nausea and vomiting for the last few days. EXAM: CT ABDOMEN AND PELVIS WITH CONTRAST TECHNIQUE: Multidetector CT imaging of the abdomen and pelvis was performed using the standard protocol following bolus administration of intravenous contrast. CONTRAST:  145mL ISOVUE-300 IOPAMIDOL (ISOVUE-300) INJECTION 61% COMPARISON:  12/03/2013 FINDINGS: Lower chest:  Unremarkable. Hepatobiliary: 7 mm hypo attenuating lesion medial segment left liver slightly increased in the interval but compatible with a tiny cyst. Gallbladder surgically absent. Common bile duct measures 13 mm diameter in the head of the pancreas, distended, but stable compared to the prior study. Pancreas: No focal mass lesion. No dilatation of the main duct. No intraparenchymal cyst. No peripancreatic edema. Spleen: No splenomegaly. No focal mass lesion. Adrenals/Urinary Tract: No adrenal nodule or mass. Volume-averaging left adrenal gland mimics a nodule. Tiny cortical cysts noted in both kidneys without enhancing renal lesion evident. No hydronephrosis. No evidence for hydroureter. The urinary bladder appears normal for the degree of distention. Stomach/Bowel: Patient is status post fundoplication with multiple pledgets evident. Circumferential wall thickening noted in the distal esophagus, at the level of the wrap. Stomach otherwise unremarkable. Duodenum is normally positioned as is the ligament of Treitz. No small bowel wall thickening. No small bowel dilatation. The terminal ileum is normal. The appendix is not visualized, but there is no edema or inflammation in the region of the cecum. Diverticular  changes are noted in the left colon without evidence of diverticulitis. Vascular/Lymphatic: There is abdominal aortic atherosclerosis without aneurysm. There is no gastrohepatic or hepatoduodenal ligament lymphadenopathy. No intraperitoneal or retroperitoneal lymphadenopathy. No pelvic sidewall lymphadenopathy. Reproductive: Uterus is surgically absent. There is no adnexal mass. Other: No intraperitoneal free fluid. Musculoskeletal: Bones are diffusely demineralized. Patient is status post posterior fusion from L1-L3. Evidence vertebral augmentation seen at L2, L3, and L4. Compression deformity at L1 is stable. IMPRESSION: 1. Circumferential wall thickening in the distal esophagus, at the level of the fundoplication. While likely related to esophagitis, neoplasm can have this imaging appearance. 2. Otherwise no acute findings in the abdomen or pelvis. Electronically Signed   By: Misty Stanley M.D.   On: 12/23/2015 14:53    Procedures Procedures (including critical care time)  Medications Ordered in ED Medications  pantoprazole (PROTONIX) EC tablet 40 mg (40 mg Oral Not Given 12/23/15 1534)  sodium chloride 0.9 % bolus 500 mL (0 mLs Intravenous Stopped 12/23/15 1314)  ondansetron (ZOFRAN) injection 4 mg (4 mg Intravenous Given 12/23/15 1230)  fentaNYL (SUBLIMAZE) injection 100 mcg (100 mcg Intravenous Given 12/23/15 1230)  promethazine (PHENERGAN) injection 12.5 mg (12.5 mg Intravenous Given 12/23/15 1348)  iopamidol (ISOVUE-300) 61 % injection 100 mL (100 mLs Intravenous Contrast Given 12/23/15 1413)  famotidine (PEPCID) IVPB 20 mg premix (20 mg Intravenous New Bag/Given 12/23/15 1531)  ondansetron (ZOFRAN) injection 4 mg (4 mg Intravenous Given 12/23/15 1614)     Initial Impression / Assessment and Plan /  ED Course  I have reviewed the triage vital signs and the nursing notes.  Pertinent labs & imaging results that were available during my care of the patient were reviewed by me and considered in my  medical decision making (see chart for details).  Clinical Course    80yF with persistent n/v. Somewhat recent back surgery, but I doubt this is directly contributory. Some tenderness in epigastrium and distal esophagitis noted on CT. Several doses of antiemetics and still retching. Will admit for intractable n/v.   Final Clinical Impressions(s) / ED Diagnoses   Final diagnoses:  Intractable vomiting with nausea, vomiting of unspecified type  Esophagitis    New Prescriptions New Prescriptions   No medications on file   I personally preformed the services scribed in my presence. The recorded information has been reviewed is accurate. Virgel Manifold, MD.    Virgel Manifold, MD 12/27/15 [phone]

## 2015-12-23 NOTE — H&P (Signed)
History and Physical    [name] [name] [id number] DOB: Oct 23, 1935 DOA: 12/23/2015   PCP: Alesia Richards, MD   Patient coming from:  Home    Chief Complaint: NAusea and vomiting   HPI: [name] [name] is a 80 y.o. female with a history of posterior process, and recent vertebral compression fracture, radiculopathy, hypertension, history of esophageal stricture, MGUS, CAD, depression, chronic pain syndrome, GERD, Asthma, Prediabetes, hyperlipidemia,  with recent admission for spinal decompression on 0000000, complicated by intractable nausea and non bloody emesis with dysphagia due to partial ileus without bowel obstruction, felt to be due to post op narcotic use. She was in her usual state of health and she will last evening, after eating  [employer]. One hour later, she began to experience nausea, complicated with non bloody vomiting this morning. The nausea was intractable. SHe has been havig similar symptoms since 2014. She had noted upper epigastric pain, and denies diarrhea or significant constipation. Last bowel movement was today. Denies fevers, chills, night sweats, vision changes, or mucositis. Denies any respiratory complaints. Denies any chest pain or palpitations. Denies lower extremity swelling.   Denies any dysuria. Denies abnormal skin rashes, or neuropathy. Denies any bleeding issues such as epistaxis, hematemesis, hematuria or hematochezia. HAs been mildly dizzy which she attributes to dehydration, no motor or sensory deficiencies     ED Course:  BP 156/61   Pulse 91   Temp 98.2 F (36.8 C) (Oral)   Resp 16   Ht 5\' 5"  (1.651 m)   Wt 66.2 kg (146 lb)   SpO2 97%   BMI 24.30 kg/m    she received Protonix IV, Zofran IV, with Phenergan, she also received Pepcid, with some control of her symptoms. She also had 500 mL bolus, and continues to receive IV fluids. EKG is negative. CT of the abdomen and pelvis shows circumferential wall thickening to the distal  esophagus, admitted L of the fundoplication, likely related to esophagitis, otherwise no other acute findings are seen. Renal, liver functions are normal. Glucose 137, CBC is normal except for PLt 554  In the setting of dehydration Review of Systems: As per HPI otherwise 10 point review of systems negative.   Past Medical History:  Diagnosis Date  . Anxiety    takes Xanax daily as needed  . Asthma    Albuterol daily as needed  . CAD (coronary artery disease)    LHC 2003 with 40% pLAD, 30% mLAD, 100% D1 (moderate vessel), 100%  D2 (small vessel), 95% mid small OM2.  Pt had PTCA to D1;  D2 and OM2 small vessels and tx medically;  Last Myoview (2012): anterior infarct seen with scar, no ischemia. EF normal. Pt managed medically;  Lexiscan Myoview (12/14):  Low risk; ant scar with very mild peri-infarct ischemia; EF 55% with ant AK   . Chronic back pain    HNP, DDD, spinal stenosis, vertebral compression fractures.   . Chronic nausea    takes Zofran daily as needed.  normal gastric emptying study 03/2013  . Constipation    felt to be functional. takes Senokot and Miralax daily.  treated with Linzess in past.   . DIVERTICULOSIS, COLON 08/28/2008   Qualifier: Diagnosis of  By: [name], [name], [name]    . Elevated LFTs 2015   probably med induced DILI, from Augmentin vs Pravastatin  . GERD (gastroesophageal reflux disease)    hx of esophageal stricture   . Hiatal hernia    Paraesophageal hernias. s/p fundoplications in  2013 and 04/2013  . History of bronchitis several of yrs ago  . History of colon polyps 03/2009, 03/2011   adenomatous colon polyps, no high grade dysplasia.   [name] History of vertigo    no meds  . Hyperlipidemia    takes Pravastatin daily  . Hypertension    takes Amlodipine,Metoprolol,and Losartan daily  . Osteoporosis   . PONV (postoperative nausea and vomiting)    yrs ago  . Slow urinary stream    takes Rapaflo daily  . TIA (transient ischemic attack) 1995   no residual  effects noted    Past Surgical History:  Procedure Laterality Date  . APPENDECTOMY     patient unsure of date  . BACK SURGERY    . BLADDER REPAIR    . CHOLECYSTECTOMY     Patient unsure of date.  . CORONARY ANGIOPLASTY  11/16/2001   1 stent  . ESOPHAGEAL MANOMETRY N/A 03/25/2013   Procedure: ESOPHAGEAL MANOMETRY (EM);  Surgeon: Inda Castle, MD;  Location: [facility];  Service: Endoscopy;  Laterality: N/A;  . ESOPHAGOGASTRODUODENOSCOPY N/A 03/15/2013   Procedure: ESOPHAGOGASTRODUODENOSCOPY (EGD);  Surgeon: Jerene Bears, MD;  Location: [facility] END[facility]COPY;  Service: Endoscopy;  Laterality: N/A;  . EYE SURGERY  11/2000   bilateral cataracts with lens implant  . FIXATION KYPHOPLASTY LUMBAR SPINE  X 2   "L3-4"  . HEMORRHOID SURGERY    . HIATAL HERNIA REPAIR  06/03/2011   Procedure: LAPAROSCOPIC REPAIR OF HIATAL HERNIA;  Surgeon: Adin Hector, MD;  Location: [facility];  Service: General;  Laterality: N/A;  . HIATAL HERNIA REPAIR N/A 04/24/2013   Procedure: LAPAROSCOPIC  REPAIR RECURRENT PARASOPHAGEAL HIATAL HERNIA WITH FUNDOPLICATION;  Surgeon: Adin Hector, MD;  Location: [facility];  Service: General;  Laterality: N/A;  . INSERTION OF MESH N/A 04/24/2013   Procedure: INSERTION OF MESH;  Surgeon: Adin Hector, MD;  Location: [facility];  Service: General;  Laterality: N/A;  . LAPAROSCOPIC LYSIS OF ADHESIONS N/A 04/24/2013   Procedure: LAPAROSCOPIC LYSIS OF ADHESIONS;  Surgeon: Adin Hector, MD;  Location: [facility];  Service: General;  Laterality: N/A;  . LAPAROSCOPIC NISSEN FUNDOPLICATION  [id number]   Procedure: LAPAROSCOPIC NISSEN FUNDOPLICATION;  Surgeon: Adin Hector, MD;  Location: [facility];  Service: General;  Laterality: N/A;  . LUMBAR FUSION  12/07/2015   Right L2-3 facetectomy, posterolateral fusion L2-3 with pedicle screws, rods, local bone graft, Vivigen cancellous chips. Fusion extended to the L1 level with pedicle screws and rods  . LUMBAR LAMINECTOMY/DECOMPRESSION  MICRODISCECTOMY Right 06/26/2012   Procedure: LUMBAR LAMINECTOMY/DECOMPRESSION MICRODISCECTOMY;  Surgeon: Jessy Oto, MD;  Location: [facility];  Service: Orthopedics;  Laterality: Right;  RIGHT L4-5 MICRODISCECTOMY  . LUMBAR LAMINECTOMY/DECOMPRESSION MICRODISCECTOMY N/A 05/29/2015   Procedure: RIGHT L2-3 MICRODISCECTOMY;  Surgeon: Jessy Oto, MD;  Location: [place];  Service: Orthopedics;  Laterality: N/A;  . TOTAL ABDOMINAL HYSTERECTOMY  1975   partial    Social History Social History   Social History  . Marital status: Married    Spouse name: [name]  . Number of children: 2  . Years of education: 12   Occupational History  . Retired Retired    worked at [employer]  . Smoking status: Never Smoker  . Smokeless tobacco: Never Used  . Alcohol use No  . Drug use: No  . Sexual activity: No   Other Topics Concern  . Not on file   Social History Narrative  Lives with husband.   Right-handed.   3 cups caffeine per day.     Allergies  Allergen Reactions  . Ace Inhibitors Cough    Per Dr Melvyn Novas pulmonology 2013  . Aspartame Diarrhea and Nausea And Vomiting  . Atorvastatin Other (See Comments)    Muscle aches  . Biaxin [Clarithromycin] Other (See Comments)    PATIENT CAN TOLERATE Z-PAK.  Is written on patient's paper chart.  [name] [Roflumilast] Nausea And Vomiting  . Erythromycin Other (See Comments)    PATIENT CAN TOLERATE Z-PAK.  It is written on patient's paper chart.  . Hydrocodone Nausea Only  . Levofloxacin Nausea And Vomiting    Family History  Problem Relation Age of Onset  . Colon cancer Mother 86  . Heart disease Father 20    heart attack      Prior to Admission medications   Medication Sig Start Date End Date Taking? Authorizing Provider  albuterol (PROVENTIL HFA;VENTOLIN HFA) 108 (90 BASE) MCG/ACT inhaler Inhale 1-2 puffs into the lungs every 6 (six) hours as needed for wheezing or shortness of breath.   Yes  Historical Provider, MD  ALPRAZolam Duanne Moron) 0.5 MG tablet Take 1/2 to 1 tablet 2 or 3 x daily if needed for nerves 07/07/15  Yes [name], MD  amLODipine (NORVASC) 10 MG tablet Take 1 tablet (10 mg total) by mouth at bedtime. Patient taking differently: Take 10 mg by mouth every morning.  09/20/14  Yes Reyne Dumas, MD  docusate sodium (COLACE) 250 MG capsule Take 1 capsule (250 mg total) by mouth 2 (two) times daily. 03/03/15  Yes Junius Creamer, NP  Cholecalciferol (VITAMIN D3) 2000 UNITS TABS Take 6,000 Units by mouth daily.     Historical Provider, MD  feeding supplement, GLUCERNA SHAKE, (GLUCERNA SHAKE) LIQD Take 237 mLs by mouth 3 (three) times daily between meals. 12/05/13   Shanker Kristeen Mans, MD  gabapentin (NEURONTIN) 100 MG capsule Take 1 capsule (100 mg total) by mouth 3 (three) times daily. 09/28/15 09/27/16  [name], MD  gabapentin (NEURONTIN) 300 MG capsule Take 300 mg by mouth at bedtime.    Historical Provider, MD  ipratropium-albuterol (DUONEB) 0.5-2.5 (3) MG/3ML SOLN USE ONE VIAL IN NEBULIZER 4 TIMES DAILY Patient taking differently: Inhale 3 mLs into the lungs every 6 (six) hours as needed. For shortness of breath 02/26/14   [name], MD  losartan (COZAAR) 100 MG tablet TAKE ONE TABLET BY MOUTH ONCE DAILY ( DISCONTINUE BENICAR) 09/30/15   Vicie Mutters, PA-C  metFORMIN (GLUCOPHAGE-XR) 500 MG 24 hr tablet TAKE ONE TO TWO TABLETS BY MOUTH TWICE DAILY WITH MEALS FOR DIABETES Patient not taking: Reported on 11/30/2015 04/03/15   [name], MD  metoprolol (LOPRESSOR) 50 MG tablet TAKE ONE TABLET BY MOUTH TWICE DAILY FOR BLOOD PRESSURE Patient taking differently: take 0.5 tablet by mouth at bedtime 02/27/14   [name], MD  mometasone-formoterol (DULERA) 200-5 MCG/ACT AERO Inhale 2 puffs into the lungs 2 (two) times daily. Patient taking differently: Inhale 2 puffs into the lungs 2 (two) times daily as needed for wheezing.  07/03/13   Kelby Aline, PA-C    nitroGLYCERIN (NITROSTAT) 0.4 MG SL tablet Place 1 tablet (0.4 mg total) under the tongue every 5 (five) minutes as needed for chest pain. 09/04/14   Larey Dresser, MD  ondansetron (ZOFRAN ODT) 4 MG disintegrating tablet Take 1 tablet (4 mg total) by mouth every 8 (eight) hours as needed for nausea or vomiting. 04/23/14   Vonna Kotyk  Geiple, PA-C  ondansetron (ZOFRAN ODT) 4 MG disintegrating tablet Take 1 tablet (4 mg total) by mouth every 8 (eight) hours as needed for nausea or vomiting. 12/11/15   Jessy Oto, MD  orphenadrine (NORFLEX) 100 MG tablet Take 1 tablet (100 mg total) by mouth 2 (two) times daily as needed for muscle spasms. 05/30/15   Jessy Oto, MD  OVER THE COUNTER MEDICATION Take 1,000 mg by mouth 2 (two) times daily. [place] Calcium 1000mg     Historical Provider, MD  OVER THE COUNTER MEDICATION Take 400 mg by mouth 2 (two) times daily. Magnesium Carbonate 400mg     Historical Provider, MD  oxyCODONE (OXY IR/ROXICODONE) 5 MG immediate release tablet Take 1 tablet (5 mg total) by mouth every 4 (four) hours as needed for severe pain or breakthrough pain. 12/11/15   Jessy Oto, MD  oxyCODONE (OXYCONTIN) 10 mg 12 hr tablet Take 1 tablet (10 mg total) by mouth every 12 (twelve) hours. 12/11/15   Jessy Oto, MD  oxyCODONE-acetaminophen (PERCOCET/ROXICET) 5-325 MG tablet Take 1-2 tablets by mouth every 4 (four) hours as needed for moderate pain. 05/30/15   Jessy Oto, MD  pantoprazole (PROTONIX) 40 MG tablet Take 1 tablet (40 mg total) by mouth daily. 03/18/15   [name], MD  polyethylene glycol [facility]) packet Take 17 g by mouth 2 (two) times daily.    Historical Provider, MD  polyethylene glycol (MIRALAX / GLYCOLAX) packet Take 17 g by mouth 2 (two) times daily. 12/11/15   Jessy Oto, MD  potassium chloride SA (K-DUR,KLOR-CON) 20 MEQ tablet Take 1 tablet (20 mEq total) by mouth 2 (two) times daily. 11/25/14   [name], MD  pravastatin (PRAVACHOL) 40 MG tablet Take 1  tablet (40 mg total) by mouth daily. 07/09/15   [name], MD  predniSONE (DELTASONE) 20 MG tablet 3 tabs po daily x 3 days, then 2 tabs x 3 days, then 1.5 tabs x 3 days, then 1 tab x 3 days, then 0.5 tabs x 3 days 11/25/15   Starlyn Skeans, PA-C  prochlorperazine (COMPAZINE) 5 MG tablet Take 1 capsule 3 x day before meals only if needed for Nausea 09/26/14   [name], MD  senna (SENOKOT) 8.6 MG TABS tablet Take 1 tablet by mouth 2 (two) times daily.  12/05/13   Shanker Kristeen Mans, MD  silodosin (RAPAFLO) 8 MG CAPS capsule Take 8 mg by mouth every other day.     Historical Provider, MD  sucralfate (CARAFATE) 1 G tablet TAKE ONE TABLET BY MOUTH 4 TIMES DAILY BEFORE MEALS AND AT BEDTIME FOR HEARTBURN 10/13/14   Vicie Mutters, PA-C  tobramycin (TOBREX) 0.3 % ophthalmic ointment Place into both eyes 2 (two) times daily. Place a 1/2 inch ribbon of ointment into the lower eyelid. 11/25/15   Starlyn Skeans, PA-C    Physical Exam:    Vitals:   12/23/15 1400 12/23/15 1545 12/23/15 1630 12/23/15 1645  BP: 137/58 162/83 141/60 156/61  Pulse: 82 95 92 91  Resp:  16 16 16   Temp:      TempSrc:      SpO2: 97% 98% 95% 97%  Weight:      Height:           Constitutional: NAD,but uncomfortable due to nausea and vomiting  Vitals:   12/23/15 1400 12/23/15 1545 12/23/15 1630 12/23/15 1645  BP: 137/58 162/83 141/60 156/61  Pulse: 82 95 92 91  Resp:  16 16 16  Temp:      TempSrc:      SpO2: 97% 98% 95% 97%  Weight:      Height:       Eyes: PERRL, lids and conjunctivae normal ENMT: Mucous membranes are dry. Posterior pharynx clear of any exudate or lesions. Missing teeth Neck: normal, supple, no masses, no thyromegaly Respiratory: clear to auscultation bilaterally, no wheezing, no crackles. Normal respiratory effort. No accessory muscle use.  Cardiovascular: Regular rate and rhythm, no murmurs / rubs / gallops. No extremity edema. 2+ pedal pulses. No carotid bruits.  Abdomen:  epigastric tenderness,   no masses palpated. No hepatosplenomegaly. Bowel sounds positive.  Musculoskeletal: no clubbing / cyanosis. No joint deformity upper and lower extremities. Good ROM, no contractures. Normal muscle tone.  Skin: no rashes, lesions, ulcers.  Neurologic: CN 2-12 grossly intact. Sensation intact, DTR normal. Strength 5/5 in all 4.  Psychiatric: Normal judgment and insight. Alert and oriented x 3. Normal mood.     Labs on Admission: I have personally reviewed following labs and imaging studies  CBC:  Recent Labs Lab 12/23/15 1212  WBC 7.9  HGB 13.3  HCT 40.8  MCV 95.3  PLT 554*    Basic Metabolic Panel:  Recent Labs Lab 12/23/15 1212  NA 135  K 3.9  CL 103  CO2 22  GLUCOSE 137*  BUN 7  CREATININE 0.73  CALCIUM 9.1    GFR: Estimated Creatinine Clearance: 52.2 mL/min (by C-G formula based on SCr of 0.8 mg/dL).  Liver Function Tests:  Recent Labs Lab 12/23/15 1212  AST 19  ALT 24  ALKPHOS 126  BILITOT 0.8  PROT 6.9  ALBUMIN 3.6    Recent Labs Lab 12/23/15 1212  LIPASE 13   No results for input(s): AMMONIA in the last 168 hours.  Coagulation Profile: No results for input(s): INR, PROTIME in the last 168 hours.  Cardiac Enzymes: No results for input(s): CKTOTAL, CKMB, CKMBINDEX, TROPONINI in the last 168 hours.  BNP (last 3 results) No results for input(s): PROBNP in the last 8760 hours.  HbA1C: No results for input(s): HGBA1C in the last 72 hours.  CBG: No results for input(s): GLUCAP in the last 168 hours.  Lipid Profile: No results for input(s): CHOL, HDL, LDLCALC, TRIG, CHOLHDL, LDLDIRECT in the last 72 hours.  Thyroid Function Tests: No results for input(s): TSH, T4TOTAL, FREET4, T3FREE, THYROIDAB in the last 72 hours.  Anemia Panel: No results for input(s): VITAMINB12, FOLATE, FERRITIN, TIBC, IRON, RETICCTPCT in the last 72 hours.  Urine analysis:    Component Value Date/Time   COLORURINE YELLOW 12/09/2015 [place] 12/09/2015 1542   LABSPEC 1.016 12/09/2015 1542   PHURINE 7.5 12/09/2015 1542   GLUCOSEU NEGATIVE 12/09/2015 1542   HGBUR NEGATIVE 12/09/2015 1542   BILIRUBINUR NEGATIVE 12/09/2015 1542   KETONESUR 15 (A) 12/09/2015 1542   PROTEINUR NEGATIVE 12/09/2015 1542   UROBILINOGEN 0.2 09/19/2014 2235   NITRITE NEGATIVE 12/09/2015 1542   LEUKOCYTESUR NEGATIVE 12/09/2015 1542    Sepsis Labs: @LABRCNTIP (procalcitonin:4,lacticidven:4) )No results found for this or any previous visit (from the past 240 hour(s)).   Radiological Exams on Admission: Ct Abdomen Pelvis W Contrast  Result Date: 12/23/2015 CLINICAL DATA:  Diffuse abdominal pain with nausea and vomiting for the last few days. EXAM: CT ABDOMEN AND PELVIS WITH CONTRAST TECHNIQUE: Multidetector CT imaging of the abdomen and pelvis was performed using the standard protocol following bolus administration of intravenous contrast. CONTRAST:  115mL ISOVUE-300 IOPAMIDOL (ISOVUE-300) INJECTION  61% COMPARISON:  12/03/2013 FINDINGS: Lower chest:  Unremarkable. Hepatobiliary: 7 mm hypo attenuating lesion medial segment left liver slightly increased in the interval but compatible with a tiny cyst. Gallbladder surgically absent. Common bile duct measures 13 mm diameter in the head of the pancreas, distended, but stable compared to the prior study. Pancreas: No focal mass lesion. No dilatation of the main duct. No intraparenchymal cyst. No peripancreatic edema. Spleen: No splenomegaly. No focal mass lesion. Adrenals/Urinary Tract: No adrenal nodule or mass. Volume-averaging left adrenal gland mimics a nodule. Tiny cortical cysts noted in both kidneys without enhancing renal lesion evident. No hydronephrosis. No evidence for hydroureter. The urinary bladder appears normal for the degree of distention. Stomach/Bowel: Patient is status post fundoplication with multiple pledgets evident. Circumferential wall thickening noted in the distal esophagus,  at the level of the wrap. Stomach otherwise unremarkable. Duodenum is normally positioned as is the ligament of Treitz. No small bowel wall thickening. No small bowel dilatation. The terminal ileum is normal. The appendix is not visualized, but there is no edema or inflammation in the region of the cecum. Diverticular changes are noted in the left colon without evidence of diverticulitis. Vascular/Lymphatic: There is abdominal aortic atherosclerosis without aneurysm. There is no gastrohepatic or hepatoduodenal ligament lymphadenopathy. No intraperitoneal or retroperitoneal lymphadenopathy. No pelvic sidewall lymphadenopathy. Reproductive: Uterus is surgically absent. There is no adnexal mass. Other: No intraperitoneal free fluid. Musculoskeletal: Bones are diffusely demineralized. Patient is status post posterior fusion from L1-L3. Evidence vertebral augmentation seen at L2, L3, and L4. Compression deformity at L1 is stable. IMPRESSION: 1. Circumferential wall thickening in the distal esophagus, at the level of the fundoplication. While likely related to esophagitis, neoplasm can have this imaging appearance. 2. Otherwise no acute findings in the abdomen or pelvis. Electronically Signed   By: Misty Stanley M.D.   On: 12/23/2015 14:53    EKG: Independently reviewed.  Assessment/Plan Active Problems:   Essential hypertension   Asthma   Constipation, chronic   Prediabetes   Osteoporosis   Coronary artery disease due to lipid rich plaque   Depression, major, in partial remission (HCC)   Chronic pain syndrome   Nausea and vomiting    Acute on chronic Intractable nausea and vomiting, multiple studies and procedures, including fundoplication.   Afebrile, nontoxic appearing. No hematemesis. Exam remarkable to epigastric pain. WBC normal  she received Protonix IV, Zofran IV, with Phenergan, she also received Pepcid, with some control of her symptoms. She also had 500 mL bolus, and continues to receive IV  fluids. CT of the abdomen and pelvis shows circumferential wall thickening to the distal esophagus,  likely related to esophagitis Renal, liver functions are normal. Glucose 137, CBC is normal. -Admit to med surg  Barium swallow Clear liquids -Advance diet as tolerated  IV Zofran   IV fluids If symptoms not improving, will request GI consult in am  PreDiabetes  Current blood sugar level is 137 Lab Results  Component Value Date   HGBA1C 5.8 (H) 12/07/2015   Hgb A1C Hold home oral diabetic medications.  , SSI Heart healthy carb modified diet.   Hypertension BP 156/61   Pulse 91   Temp 98.2 F (36.8 C) (Oral)   Resp 16   Ht 5\' 5"  (1.651 m)   Wt 66.2 kg (146 lb)   SpO2 97%   BMI 24.30 kg/m  Controlled Continue home anti-hypertensive medications    Depression  Comtinue meds   Chronic pain syndrome, recent spinal surgery  Cautious use of pain meds due to risk of ileus   Asthma without exacerbation  Comtinue home meds   DVT prophylaxis: Lovenox   Code Status:   Full   Family Communication:  Discussed with patient husband  Disposition Plan: Expect patient to be discharged to home after condition improves Consults called:    None Admission status:  Medsurg     Lem Peary E, PA-C [facility]   If 7PM-7AM, please contact night-coverage [email or web address] Password [email or web address]  12/23/2015, 4:53 PM

## 2015-12-23 NOTE — ED Triage Notes (Signed)
Pt. Coming from home via [facility] for vomiting since yesterday. Pt. Had extensive back surgery July 31st here. Pt. Had to stop taking pain medication because how sick it made her. Pt. sts her vomiting started yesterday and hasn't stopped. Vomiting since. Pt. Describes it as bile/green in color. Pt. Given 4 mg zofran en route with no relief.

## 2015-12-23 NOTE — Telephone Encounter (Signed)
Spouse called and states the patient has had vomiting and nausea x 14 hours, not relieved by Zofran.  Patient lumbar surgery 12/07/2015 and is in pain.  Patient is also very dizzy.  Per Dr Melford Aase, the patient needs to be evaluated at the ER.  Spouse is aware and will call 911.

## 2015-12-24 ENCOUNTER — Observation Stay (HOSPITAL_COMMUNITY): Payer: PPO

## 2015-12-24 ENCOUNTER — Inpatient Hospital Stay (HOSPITAL_COMMUNITY): Payer: PPO

## 2015-12-24 DIAGNOSIS — M549 Dorsalgia, unspecified: Secondary | ICD-10-CM | POA: Diagnosis not present

## 2015-12-24 DIAGNOSIS — I10 Essential (primary) hypertension: Secondary | ICD-10-CM | POA: Diagnosis not present

## 2015-12-24 DIAGNOSIS — R112 Nausea with vomiting, unspecified: Secondary | ICD-10-CM | POA: Diagnosis not present

## 2015-12-24 DIAGNOSIS — R1310 Dysphagia, unspecified: Secondary | ICD-10-CM

## 2015-12-24 DIAGNOSIS — J4520 Mild intermittent asthma, uncomplicated: Secondary | ICD-10-CM | POA: Diagnosis not present

## 2015-12-24 DIAGNOSIS — E119 Type 2 diabetes mellitus without complications: Secondary | ICD-10-CM | POA: Diagnosis not present

## 2015-12-24 DIAGNOSIS — K209 Esophagitis, unspecified: Secondary | ICD-10-CM | POA: Diagnosis not present

## 2015-12-24 DIAGNOSIS — J45909 Unspecified asthma, uncomplicated: Secondary | ICD-10-CM | POA: Diagnosis not present

## 2015-12-24 DIAGNOSIS — Z7951 Long term (current) use of inhaled steroids: Secondary | ICD-10-CM | POA: Diagnosis not present

## 2015-12-24 DIAGNOSIS — Z9842 Cataract extraction status, left eye: Secondary | ICD-10-CM | POA: Diagnosis not present

## 2015-12-24 DIAGNOSIS — F419 Anxiety disorder, unspecified: Secondary | ICD-10-CM | POA: Diagnosis not present

## 2015-12-24 DIAGNOSIS — I2583 Coronary atherosclerosis due to lipid rich plaque: Secondary | ICD-10-CM | POA: Diagnosis not present

## 2015-12-24 DIAGNOSIS — Z79899 Other long term (current) drug therapy: Secondary | ICD-10-CM | POA: Diagnosis not present

## 2015-12-24 DIAGNOSIS — Z8673 Personal history of transient ischemic attack (TIA), and cerebral infarction without residual deficits: Secondary | ICD-10-CM | POA: Diagnosis not present

## 2015-12-24 DIAGNOSIS — E785 Hyperlipidemia, unspecified: Secondary | ICD-10-CM | POA: Diagnosis not present

## 2015-12-24 DIAGNOSIS — Z955 Presence of coronary angioplasty implant and graft: Secondary | ICD-10-CM | POA: Diagnosis not present

## 2015-12-24 DIAGNOSIS — Z881 Allergy status to other antibiotic agents status: Secondary | ICD-10-CM | POA: Diagnosis not present

## 2015-12-24 DIAGNOSIS — Z981 Arthrodesis status: Secondary | ICD-10-CM | POA: Diagnosis not present

## 2015-12-24 DIAGNOSIS — Z9841 Cataract extraction status, right eye: Secondary | ICD-10-CM | POA: Diagnosis not present

## 2015-12-24 DIAGNOSIS — G894 Chronic pain syndrome: Secondary | ICD-10-CM

## 2015-12-24 DIAGNOSIS — R1110 Vomiting, unspecified: Secondary | ICD-10-CM | POA: Diagnosis not present

## 2015-12-24 DIAGNOSIS — K222 Esophageal obstruction: Secondary | ICD-10-CM

## 2015-12-24 DIAGNOSIS — B370 Candidal stomatitis: Secondary | ICD-10-CM | POA: Diagnosis not present

## 2015-12-24 DIAGNOSIS — Z885 Allergy status to narcotic agent status: Secondary | ICD-10-CM | POA: Diagnosis not present

## 2015-12-24 DIAGNOSIS — R42 Dizziness and giddiness: Secondary | ICD-10-CM | POA: Diagnosis not present

## 2015-12-24 DIAGNOSIS — F3341 Major depressive disorder, recurrent, in partial remission: Secondary | ICD-10-CM | POA: Diagnosis not present

## 2015-12-24 DIAGNOSIS — K210 Gastro-esophageal reflux disease with esophagitis: Secondary | ICD-10-CM | POA: Diagnosis not present

## 2015-12-24 DIAGNOSIS — F324 Major depressive disorder, single episode, in partial remission: Secondary | ICD-10-CM | POA: Diagnosis not present

## 2015-12-24 DIAGNOSIS — Z7984 Long term (current) use of oral hypoglycemic drugs: Secondary | ICD-10-CM | POA: Diagnosis not present

## 2015-12-24 DIAGNOSIS — Z961 Presence of intraocular lens: Secondary | ICD-10-CM | POA: Diagnosis not present

## 2015-12-24 DIAGNOSIS — M810 Age-related osteoporosis without current pathological fracture: Secondary | ICD-10-CM | POA: Diagnosis not present

## 2015-12-24 DIAGNOSIS — Z888 Allergy status to other drugs, medicaments and biological substances status: Secondary | ICD-10-CM | POA: Diagnosis not present

## 2015-12-24 LAB — COMPREHENSIVE METABOLIC PANEL
ALT: 19 U/L (ref 14–54)
ANION GAP: 9 (ref 5–15)
AST: 15 U/L (ref 15–41)
Albumin: 3.1 g/dL — ABNORMAL LOW (ref 3.5–5.0)
Alkaline Phosphatase: 101 U/L (ref 38–126)
BILIRUBIN TOTAL: 0.6 mg/dL (ref 0.3–1.2)
BUN: 8 mg/dL (ref 6–20)
CHLORIDE: 106 mmol/L (ref 101–111)
CO2: 21 mmol/L — ABNORMAL LOW (ref 22–32)
Calcium: 8.5 mg/dL — ABNORMAL LOW (ref 8.9–10.3)
Creatinine, Ser: 0.76 mg/dL (ref 0.44–1.00)
Glucose, Bld: 92 mg/dL (ref 65–99)
POTASSIUM: 3.8 mmol/L (ref 3.5–5.1)
Sodium: 136 mmol/L (ref 135–145)
TOTAL PROTEIN: 5.9 g/dL — AB (ref 6.5–8.1)

## 2015-12-24 LAB — CBC
HEMATOCRIT: 37.7 % (ref 36.0–46.0)
HEMOGLOBIN: 11.9 g/dL — AB (ref 12.0–15.0)
MCH: 30 pg (ref 26.0–34.0)
MCHC: 31.6 g/dL (ref 30.0–36.0)
MCV: 95 fL (ref 78.0–100.0)
Platelets: 522 10*3/uL — ABNORMAL HIGH (ref 150–400)
RBC: 3.97 MIL/uL (ref 3.87–5.11)
RDW: 13 % (ref 11.5–15.5)
WBC: 6.5 10*3/uL (ref 4.0–10.5)

## 2015-12-24 LAB — GLUCOSE, CAPILLARY
GLUCOSE-CAPILLARY: 87 mg/dL (ref 65–99)
Glucose-Capillary: 90 mg/dL (ref 65–99)
Glucose-Capillary: 102 mg/dL — ABNORMAL HIGH (ref 65–99)
Glucose-Capillary: 93 mg/dL (ref 65–99)

## 2015-12-24 LAB — HEMOGLOBIN A1C
Hgb A1c MFr Bld: 5.5 % (ref 4.8–5.6)
MEAN PLASMA GLUCOSE: 111 mg/dL

## 2015-12-24 MED ORDER — MECLIZINE HCL 25 MG PO TABS
25.0000 mg | ORAL_TABLET | Freq: Three times a day (TID) | ORAL | Status: DC
  Administered 2015-12-24 – 2015-12-26 (×5): 25 mg via ORAL
  Filled 2015-12-24 (×9): qty 1

## 2015-12-24 MED ORDER — PANTOPRAZOLE SODIUM 40 MG PO TBEC
40.0000 mg | DELAYED_RELEASE_TABLET | Freq: Every day | ORAL | Status: DC
  Administered 2015-12-24 – 2015-12-26 (×3): 40 mg via ORAL
  Filled 2015-12-24 (×3): qty 1

## 2015-12-24 NOTE — Progress Notes (Signed)
PROGRESS NOTE        PATIENT DETAILS Name: [name] [name] Age: 80 y.o. Sex: female Date of Birth: 08-08-1935 Admit Date: 12/23/2015 Admitting Physician Costin Karlyne Greenspan, MD [id number] DAVID, MD  Brief Narrative: Patient is a 80 y.o. female primary esophageal stricture with dilatation, history of GERD, hiatal hernia status post fundoplication in [id number] who was admitted to the hospital on 8/16 with sudden onset of vomiting and epigastric discomfort. On admission she was observed to have some thrush and empirically started on Diflucan. Her vomiting is somewhat better with supportive measures, she underwent a barium esophagogram earlier this morning which shows a stricture. GI has been consulted for possible endoscopic dilatation tomorrow morning.  Subjective: Complains of dizziness this morning, claims that when she moves her head in all directions and dizziness worsens. Still nauseous, but vomiting clearly better.  Assessment/Plan: Vomiting:? Etiology-she does acknowledge some dizziness today that apparently started last night after she was admitted. Not sure if this is related to vestibular process. However-he does have a complicated GI history with strictures needing dilatation, hiatal hernia with history of fundoplication 2. CT of the abdomen on admission was negative, lipase was within normal levels. She underwent a a.m. esophagogram that shows a possible stricture, have consulted gastroenterology. In the meantime, continue supportive care, antiemetics as needed.  Oral thrush: Continue Diflucan, seems to have improved already-I do not see any evidence of thrush on my exam this morning. Given vomiting and epigastric discomfort, concern for esophageal candidiasis as well. Await endoscopic evaluation.  Dizziness/lightheadedness: Check orthostatics, start meclizine. Will check a CT of the head-but patient without any focal neurological deficits. I am not sure if  if she has vestibular issue-with her having dizziness and vomiting-we'll start meclizine and get vestibular PT evaluation.  History of esophageal stricture needing dilatation in the past: Barium esophagogram shows a possible stricture, await GI evaluation-likely will need endoscopic evaluation tomorrow morning.  History of hiatal hernia-status post fundoplication 2: CT of the abdomen scan demonstrates circumferential thickening in the distal esophagus-on PPI-GI consulted.  GERD: Continue PPI  Type 2 diabetes: CBGs stable with SSI-continue to hold metformin for now.  Hypertension: Controlled-continue metoprolol, amlodipine and losartan.  Dyslipidemia: Continue statin  History of chronic back pain: Continue Neurontin, as needed Toradol  Depression/anxiety: Continue Xanax  History of asthma: Stable lungs are clear-continue as needed bronchodilators  DVT Prophylaxis: SCD's pending endoscopy  Code Status: Full code  Family Communication: Spouse at bedside  Disposition Plan: Remain inpatient-home when vomiting resolves  Antimicrobial agents: Diflucan 8/16>>  Procedures: Barium esophagogram 8/17-stricture in the distal third of the esophagus  CONSULTS:  GI  Time spent: 25 minutes-Greater than 50% of this time was spent in counseling, explanation of diagnosis, planning of further management, and coordination of care.  MEDICATIONS: Anti-infectives    Start     Dose/Rate Route Frequency Ordered Stop   12/23/15 1900  fluconazole (DIFLUCAN) IVPB 100 mg     100 mg 50 mL/hr over 60 Minutes Intravenous Every 24 hours 12/23/15 1754        Scheduled Meds: . amLODipine  10 mg Oral QHS  . enoxaparin (LOVENOX) injection  40 mg Subcutaneous Q24H  . fluconazole (DIFLUCAN) IV  100 mg Intravenous Q24H  . gabapentin  100 mg Oral TID  . gabapentin  300 mg Oral QHS  . insulin aspart  0-9  Units Subcutaneous TID WC  . losartan  100 mg Oral Daily  . meclizine  25 mg Oral TID  .  metoprolol  12.5 mg Oral BID  . pantoprazole  40 mg Oral Q1200  . polyethylene glycol  17 g Oral BID  . pravastatin  40 mg Oral Daily   Continuous Infusions: . sodium chloride 100 mL/hr at 12/23/15 1844   PRN Meds:.acetaminophen **OR** acetaminophen, albuterol, ALPRAZolam, bisacodyl, ipratropium-albuterol, ketorolac, magnesium citrate, ondansetron **OR** ondansetron (ZOFRAN) IV, senna-docusate, traZODone   PHYSICAL EXAM: Vital signs: Vitals:   12/23/15 1814 12/23/15 2100 12/24/15 0525 12/24/15 1451  BP: (!) 98/59 (!) 159/67 (!) 107/55 113/64  Pulse: (!) 138 98 73 79  Resp:  16 16 16   Temp: 99.4 F (37.4 C) 99.1 F (37.3 C) 99.5 F (37.5 C) 98 F (36.7 C)  TempSrc: Oral Oral  Oral  SpO2: 90% 97% 96% 98%  Weight:      Height:       Filed Weights   12/23/15 1210  Weight: 66.2 kg (146 lb)   Body mass index is 24.3 kg/m.   Gen Exam: Awake and alert with clear speech. Not in any distress  Neck: Supple, No JVD.   Chest: B/L Clear.   CVS: S1 S2 Regular, no murmurs.  Abdomen: soft, BS +, non tender, non distended.  Extremities: no edema, lower extremities warm to touch. Neurologic: Non Focal.   Skin: No Rash or lesions   Wounds: N/A.  I have personally reviewed following labs and imaging studies  LABORATORY DATA: CBC:  Recent Labs Lab 12/23/15 1212 12/24/15 0310  WBC 7.9 6.5  HGB 13.3 11.9*  HCT 40.8 37.7  MCV 95.3 95.0  PLT 554* 522*    Basic Metabolic Panel:  Recent Labs Lab 12/23/15 1212 12/24/15 0310  NA 135 136  K 3.9 3.8  CL 103 106  CO2 22 21*  GLUCOSE 137* 92  BUN 7 8  CREATININE 0.73 0.76  CALCIUM 9.1 8.5*    GFR: Estimated Creatinine Clearance: 52.2 mL/min (by C-G formula based on SCr of 0.8 mg/dL).  Liver Function Tests:  Recent Labs Lab 12/23/15 1212 12/24/15 0310  AST 19 15  ALT 24 19  ALKPHOS 126 101  BILITOT 0.8 0.6  PROT 6.9 5.9*  ALBUMIN 3.6 3.1*    Recent Labs Lab 12/23/15 1212  LIPASE 13   No results for  input(s): AMMONIA in the last 168 hours.  Coagulation Profile: No results for input(s): INR, PROTIME in the last 168 hours.  Cardiac Enzymes: No results for input(s): CKTOTAL, CKMB, CKMBINDEX, TROPONINI in the last 168 hours.  BNP (last 3 results) No results for input(s): PROBNP in the last 8760 hours.  HbA1C:  Recent Labs  12/23/15 1812  HGBA1C 5.5    CBG:  Recent Labs Lab 12/23/15 2207 12/24/15 0737 12/24/15 1204  GLUCAP 98 90 102*    Lipid Profile: No results for input(s): CHOL, HDL, LDLCALC, TRIG, CHOLHDL, LDLDIRECT in the last 72 hours.  Thyroid Function Tests: No results for input(s): TSH, T4TOTAL, FREET4, T3FREE, THYROIDAB in the last 72 hours.  Anemia Panel: No results for input(s): VITAMINB12, FOLATE, FERRITIN, TIBC, IRON, RETICCTPCT in the last 72 hours.  Urine analysis:    Component Value Date/Time   COLORURINE YELLOW 12/09/2015 [place] 12/09/2015 1542   LABSPEC 1.016 12/09/2015 1542   PHURINE 7.5 12/09/2015 1542   GLUCOSEU NEGATIVE 12/09/2015 1542   HGBUR NEGATIVE 12/09/2015 1542   [place] 12/09/2015  1542   KETONESUR 15 (A) 12/09/2015 1542   PROTEINUR NEGATIVE 12/09/2015 1542   UROBILINOGEN 0.2 09/19/2014 2235   NITRITE NEGATIVE 12/09/2015 1542   LEUKOCYTESUR NEGATIVE 12/09/2015 1542    Sepsis Labs: Lactic Acid, Venous    Component Value Date/Time   LATICACIDVEN 2.5 (H) 03/12/2013 0101    MICROBIOLOGY: No results found for this or any previous visit (from the past 240 hour(s)).  RADIOLOGY STUDIES/RESULTS: Dg Chest 2 View  Result Date: 12/09/2015 CLINICAL DATA:  Productive cough, status post recent back surgery EXAM: CHEST  2 VIEW COMPARISON:  12/04/2015 FINDINGS: Cardiac shadow is stable. The lungs are well aerated bilaterally. No focal infiltrate or sizable effusion is seen. No acute bony abnormality is noted. IMPRESSION: No active cardiopulmonary disease. Electronically Signed   By: Inez Catalina M.D.   On:  12/09/2015 09:19   Dg Chest 2 View  Result Date: 12/04/2015 CLINICAL DATA:  Preoperative evaluation for lumbar fusion. Hypertension. EXAM: CHEST  2 VIEW COMPARISON:  Sep 19, 2014 and August 30, 2014 FINDINGS: There is slight scarring in the left base region laterally. There is no edema or consolidation. Heart size and pulmonary vascularity are normal. No adenopathy. There is atherosclerotic calcification in the aorta. There is anterior wedging of a lower thoracic vertebral body. Patient has had an upper lumbar vertebral body kyphoplasty procedure. IMPRESSION: Mild scarring left base. No edema or consolidation. Stable cardiac silhouette. Aortic atherosclerosis. Electronically Signed   By: Lowella Grip III M.D.   On: 12/04/2015 09:20  Dg Lumbar Spine 2-3 Views  Result Date: 12/07/2015 CLINICAL DATA:  L2-3 fusion EXAM: LUMBAR SPINE - 2-3 VIEW; DG C-ARM GT 120 MIN COMPARISON:  05/29/2015 FLUOROSCOPY TIME:  Radiation Exposure Index (as provided by the fluoroscopic device): Not available If the device does not provide the exposure index: Fluoroscopy Time:  35 seconds Number of Acquired Images:  4 FINDINGS: Changes of vertebral augmentation are noted at L2, L3 and L4. Pedicle screws are noted at L1, L2 and L3 with posterior fixation. Chronic compression deformity at L1 is noted as well. IMPRESSION: Intraoperative fusion. Electronically Signed   By: Inez Catalina M.D.   On: 12/07/2015 11:39  Dg Abd 1 View  Result Date: 12/10/2015 CLINICAL DATA:  Follow-up ileus. EXAM: ABDOMEN - 1 VIEW COMPARISON:  Yesterday FINDINGS: No dilated bowel today. Bowel gas pattern is normal. No abnormal stool retention or impaction. No suspicious gas collection. Cholecystectomy clips, lumbar fusion, and multilevel vertebroplasty. IMPRESSION: Normal bowel gas pattern. Electronically Signed   By: Monte Fantasia M.D.   On: 12/10/2015 08:17   Dg Abd 1 View  Result Date: 12/09/2015 CLINICAL DATA:  Nausea and vomiting today. Back  surgery 2 days ago. Pain. EXAM: ABDOMEN - 1 VIEW COMPARISON:  Intraoperative spine films 12/07/2015. MRI lumbar spine 09/10/2015. FINDINGS: Mildly distended loops of small and large bowel favored to represent ileus. Moderate stool in the cecum. Previous vertebral augmentation at L2, L3, and L4, with more recently posterior pedicle screw fixation from L1 through L3 bilaterally. IMPRESSION: Question ileus.  No visible signs of bowel obstruction. Electronically Signed   By: Staci Righter M.D.   On: 12/09/2015 15:27   Ct Abdomen Pelvis W Contrast  Result Date: 12/23/2015 CLINICAL DATA:  Diffuse abdominal pain with nausea and vomiting for the last few days. EXAM: CT ABDOMEN AND PELVIS WITH CONTRAST TECHNIQUE: Multidetector CT imaging of the abdomen and pelvis was performed using the standard protocol following bolus administration of intravenous contrast. CONTRAST:  174mL ISOVUE-300  IOPAMIDOL (ISOVUE-300) INJECTION 61% COMPARISON:  12/03/2013 FINDINGS: Lower chest:  Unremarkable. Hepatobiliary: 7 mm hypo attenuating lesion medial segment left liver slightly increased in the interval but compatible with a tiny cyst. Gallbladder surgically absent. Common bile duct measures 13 mm diameter in the head of the pancreas, distended, but stable compared to the prior study. Pancreas: No focal mass lesion. No dilatation of the main duct. No intraparenchymal cyst. No peripancreatic edema. Spleen: No splenomegaly. No focal mass lesion. Adrenals/Urinary Tract: No adrenal nodule or mass. Volume-averaging left adrenal gland mimics a nodule. Tiny cortical cysts noted in both kidneys without enhancing renal lesion evident. No hydronephrosis. No evidence for hydroureter. The urinary bladder appears normal for the degree of distention. Stomach/Bowel: Patient is status post fundoplication with multiple pledgets evident. Circumferential wall thickening noted in the distal esophagus, at the level of the wrap. Stomach otherwise  unremarkable. Duodenum is normally positioned as is the ligament of Treitz. No small bowel wall thickening. No small bowel dilatation. The terminal ileum is normal. The appendix is not visualized, but there is no edema or inflammation in the region of the cecum. Diverticular changes are noted in the left colon without evidence of diverticulitis. Vascular/Lymphatic: There is abdominal aortic atherosclerosis without aneurysm. There is no gastrohepatic or hepatoduodenal ligament lymphadenopathy. No intraperitoneal or retroperitoneal lymphadenopathy. No pelvic sidewall lymphadenopathy. Reproductive: Uterus is surgically absent. There is no adnexal mass. Other: No intraperitoneal free fluid. Musculoskeletal: Bones are diffusely demineralized. Patient is status post posterior fusion from L1-L3. Evidence vertebral augmentation seen at L2, L3, and L4. Compression deformity at L1 is stable. IMPRESSION: 1. Circumferential wall thickening in the distal esophagus, at the level of the fundoplication. While likely related to esophagitis, neoplasm can have this imaging appearance. 2. Otherwise no acute findings in the abdomen or pelvis. Electronically Signed   By: Misty Stanley M.D.   On: 12/23/2015 14:53   Dg Esophagus  Result Date: 12/24/2015 CLINICAL DATA:  80 year old female with nausea and vomiting. EXAM: ESOPHOGRAM / BARIUM SWALLOW / BARIUM TABLET STUDY TECHNIQUE: Single contrast examination performed using thin barium liquid. The patient was observed with fluoroscopy swallowing a 13 mm barium sulphate tablet. FLUOROSCOPY TIME:  Fluoroscopy Time:  2 minutes and 6 seconds COMPARISON:  No priors. FINDINGS: Due to patient's limited mobility, a single contrast examination was performed. This demonstrated relatively normal esophageal motility in terms of propagation of the primary peristaltic wave. Occasionally, a mild tertiary contraction was observed. No esophageal mass, hiatal hernia or esophageal ring was identified.  However, a barium tablet was administered, which failed to pass through the distal third of the esophagus immediately above the gastroesophageal junction. IMPRESSION: 1. Findings are compatible with a stricture in the distal third of the esophagus immediately before the gastroesophageal junction. Correlation with endoscopy is suggested if clinically appropriate. 2. Mild nonspecific esophageal motility disorder with occasional mild tertiary contractions. Electronically Signed   By: Vinnie Langton M.D.   On: 12/24/2015 12:02   Dg C-arm Gt 120 Min  Result Date: 12/07/2015 CLINICAL DATA:  L2-3 fusion EXAM: LUMBAR SPINE - 2-3 VIEW; DG C-ARM GT 120 MIN COMPARISON:  05/29/2015 FLUOROSCOPY TIME:  Radiation Exposure Index (as provided by the fluoroscopic device): Not available If the device does not provide the exposure index: Fluoroscopy Time:  35 seconds Number of Acquired Images:  4 FINDINGS: Changes of vertebral augmentation are noted at L2, L3 and L4. Pedicle screws are noted at L1, L2 and L3 with posterior fixation. Chronic compression deformity at L1 is  noted as well. IMPRESSION: Intraoperative fusion. Electronically Signed   By: Inez Catalina M.D.   On: 12/07/2015 11:39    LOS: 0 days   Oren Binet, MD  [facility] Pager:[phone]  If 7PM-7AM, please contact night-coverage [email or web address] Password [facility] 12/24/2015, 3:38 PM

## 2015-12-24 NOTE — Plan of Care (Signed)
Problem: Activity: Goal: Risk for activity intolerance will decrease Outcome: Progressing Pt mobilized well with PT this pm  Problem: Nutrition: Goal: Adequate nutrition will be maintained Outcome: Progressing No c/o nausea and vomiting today

## 2015-12-24 NOTE — Evaluation (Signed)
Occupational Therapy Evaluation Patient Details Name: [name] [name] MRN: [id number] DOB: 1936-04-10 Today's Date: 12/24/2015    History of Present Illness S/P L2-3 PLIF end of July admitted to hospital for epigastric discomfort as well as nausea and vomiting   Clinical Impression   Pt with recent back surgery end of July and was at home participating [facility] therapy PTA. Pt continues to require assist with LB ADLs due to back precautions and decreased functional balance. Per pt and her husband her there no change in function with ADL level since d/c home after back surgery. Pt requires safety cues during sit - stand and stand - sit transitions, transfers and mobility to maintain back precautions. Pt and her husband state that this is her current baseline since she was still actively participating in [facility] PT. Pt has all necessary DME and A/E at home. All education completed and no further acute OT indicated at this time.    Follow Up Recommendations  Home health OT;Supervision/Assistance - 24 hour    Equipment Recommendations  None recommended by OT    Recommendations for Other Services PT consult     Precautions / Restrictions Precautions Precautions: Fall;Back Precaution Comments: Pt and husband able to recall 3/3 back precautions Required Braces or Orthoses: Spinal Brace Spinal Brace: Thoracolumbosacral orthotic Restrictions Weight Bearing Restrictions: No      Mobility Bed Mobility               General bed mobility comments: pt up in chair upon entering room  Transfers Overall transfer level: Needs assistance Equipment used: Rolling walker (2 wheeled) Transfers: Sit to/from Stand Sit to Stand: Min guard         General transfer comment: Pt required multiple cues to increase upright standing posture during sit - stand, stand - sit and during ambulation with RW. multimodal Cues to safely use with RW. Pt bumping into wall/chair x 3 using RW    Balance     Sitting  balance-Leahy Scale: Fair     Standing balance support: Bilateral upper extremity supported;During functional activity Standing balance-Leahy Scale: Poor                              ADL       Grooming: Wash/dry face;Min guard;Standing;Wash/dry hands       Lower Body Bathing: Sit to/from stand;With caregiver independent assisting;Moderate assistance;Cueing for back precautions       Lower Body Dressing: With adaptive equipment;With caregiver independent assisting;Sit to/from stand;Moderate assistance;Cueing for back precautions   Toilet Transfer: Ambulation;Min guard;Cueing for safety;RW;Comfort height toilet;Grab bars Toilet Transfer Details (indicate cue type and reason): cues to maintain back precautions Toileting- Clothing Manipulation and Hygiene: Sit to/from stand;Minimal assistance;Cueing for back precautions;Cueing for safety       Functional mobility during ADLs: Rolling walker;Min guard;Cueing for safety (cues to maintain back precautions)       Vision  no change from baseline              Pertinent Vitals/Pain Pain Assessment: 0-10 Pain Score: 7  Pain Location: back Pain Descriptors / Indicators: Nagging;Discomfort Pain Intervention(s): Monitored during session;Repositioned     Hand Dominance Right   Extremity/Trunk Assessment Upper Extremity Assessment Upper Extremity Assessment: Generalized weakness   Lower Extremity Assessment Lower Extremity Assessment: Defer to PT evaluation   Cervical / Trunk Assessment Cervical / Trunk Assessment: Other exceptions Cervical / Trunk Exceptions: wearing TLSO, multiple cues to maintain back  precautions and to increase upright standing posture   Communication Communication Communication: 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   Cognition Arousal/Alertness: Awake/alert Behavior During Therapy: WFL for tasks assessed/performed Overall Cognitive Status: Within Functional Limits for tasks assessed                      General Comments   pt very pleasant and cooperative                 Home Living Family/patient expects to be discharged to:: Private residence Living Arrangements: Spouse/significant other Available Help at Discharge: Family;Available 24 hours/day Type of Home: House Home Access: Stairs to enter [employer] of Steps: 3 Entrance Stairs-Rails: None Home Layout: One level     Bathroom Shower/Tub: Occupational psychologist: Handicapped height Bathroom Accessibility: Yes How Accessible: Accessible via walker Home Equipment: Walker - 2 wheels;Crutches;Bedside commode;Shower seat;Grab bars - tub/shower          Prior Functioning/Environment Level of Independence: Needs assistance  Gait / Transfers Assistance Needed: Uses cane or rolling walker at times. was working with [facility] PT PTA ADL's / Homemaking Assistance Needed: assist with LB ADLs since recent back surgery        OT Diagnosis: Generalized weakness   OT Problem List: Decreased activity tolerance;Decreased knowledge of use of DME or AE;Decreased safety awareness;Pain;Impaired balance (sitting and/or standing)   OT Treatment/Interventions:      OT Goals(Current goals can be found in the care plan section) Acute Rehab OT Goals Patient Stated Goal: Be home with husband.  OT Frequency:     Barriers to D/C:    none                     End of Session Equipment Utilized During Treatment: Rolling walker;Gait belt;Other (comment) (3 in 1)  Activity Tolerance: Patient tolerated treatment well Patient left: in chair;with call bell/phone within reach;with family/visitor present   Time: [id number] OT Time Calculation (min): 27 min Charges:  OT General Charges $OT Visit: 1 Procedure OT Evaluation $OT Eval Moderate Complexity: 1 Procedure OT Treatments $Therapeutic Activity: 8-22 mins G-Codes: OT G-codes **NOT FOR INPATIENT CLASS** Functional Assessment Tool Used: clinical  judgement Functional Limitation: Self care Self Care Current Status [id number]): At least 20 percent but less than 40 percent impaired, limited or restricted Self Care Goal Status [id number]): At least 20 percent but less than 40 percent impaired, limited or restricted Self Care Discharge Status [phone]): At least 20 percent but less than 40 percent impaired, limited or restricted  Britt Bottom 12/24/2015, 1:06 PM

## 2015-12-24 NOTE — Consult Note (Signed)
[facility] Consult: 4:44 PM 12/24/2015  LOS: 0 days    Referring Provider: Dr Sloan Leiter  Primary Care Physician:  Alesia Richards, MD Primary Gastroenterologist:  Dr. Erskine Emery    Reason for Consultation:  Esophageal stricture, vomiting.    HPI: [name] [name] is a 80 y.o. female.  CAD.  PTCA in 2003.  HTN.  TIA.  Vertigo.  s/p lumbar fusion, microdiscectomy and kyphoplasty.  Chronic back pain, neurogenic claudication. Osteoporosis. .  Functional constipation.  Transfusion requiring IDA anemia.  Hx of dyphagia, esophageal stricture dilations.  Hx aspiration PNA. S/p Nissen fundoplication [id number] which slipped and underwent redo fundoplication/repair paraesophageal hernia with mesh (2.5 hours of LOA) 04/2013.   08/2013 Esophageal Motility study 03/2013 EGD.  Dr Dalbert Batman.  Dema Severin esophageal exudates:candida.  Slipped fundoplication.  03/2011 EGD with maloney dilation of distal esophageal stricture. For dysphagia.  03/2011 Colonoscopy for hx colon cancer in parent.  2 mm ascending polyp, 14mm descending polyp, internal hemorrhoids.  Path: tubular adenomas.     03/2009 Capsule endo.  Negative study with no findings to explain anemia  Just s/p 12/07/15 lumbar spine fusion.  Seen during admission for n/v/dysphagia.  N/v attributed to partial ileus.  Sxs and xray improved with senokot, Miralax, stopping Robaxin, minimizing narcotics.  Mild AST elevation noted.  Viral hepatitis serologies negative.  Discharged 12/11/15.    Readmitted 8/16 with acute onset of nausea, clear emesis (more like spit than vomit) and lots of dry heaving. Started 8/14 at 9 PM. Up until then had not been having active GI issues though appetite and po was diminished and less frequent stools (is using opioids).  Finally came to ED after about  total 15 hours of sxs and, after anti-nauseals, the sxs resolved.  Had white material on tongue and is being treated with IV Diflucan for oral candidiasis.  Tolerating clears, ready to try more advanced foods.   LFTs normal except albumin low at 3.1.  WBCs, urine normal.   8/16 CT ab/pelvis: distal esophageal thickening at level of fundoplication.   Esophagram today: mostly normal motility but occasional tertiary waves.  Tablet failed to pass through distal esophagus just above the GEjx, "findings c/w with distal esophageal stricture"   Pt says she has infrequent dysphagia to solids which is unpredictable.  Has not has EGD since fundoplication #2.  Nausea and vomiting are not normal for her.  Since the n/v incident, she is having dizziness/vertigo, even if she just turns her head.    Past Medical History:  Diagnosis Date  . Anxiety    takes Xanax daily as needed  . Asthma    Albuterol daily as needed  . CAD (coronary artery disease)    LHC 2003 with 40% pLAD, 30% mLAD, 100% D1 (moderate vessel), 100%  D2 (small vessel), 95% mid small OM2.  Pt had PTCA to D1;  D2 and OM2 small vessels and tx medically;  Last Myoview (2012): anterior infarct seen with scar, no ischemia. EF normal. Pt managed medically;  Lexiscan Myoview (12/14):  Low risk; ant scar with  very mild peri-infarct ischemia; EF 55% with ant AK   . Chronic back pain    HNP, DDD, spinal stenosis, vertebral compression fractures.   . Chronic nausea    takes Zofran daily as needed.  normal gastric emptying study 03/2013  . Constipation    felt to be functional. takes Senokot and Miralax daily.  treated with Linzess in past.   . DIVERTICULOSIS, COLON 08/28/2008   Qualifier: Diagnosis of  By: [name], [name], [name]    . Elevated LFTs 2015   probably med induced DILI, from Augmentin vs Pravastatin  . GERD (gastroesophageal reflux disease)    hx of esophageal stricture   . Hiatal hernia    Paraesophageal hernias. s/p fundoplications in  0000000 and 04/2013  . History of bronchitis several of yrs ago  . History of colon polyps 03/2009, 03/2011   adenomatous colon polyps, no high grade dysplasia.   [name] History of vertigo    no meds  . Hyperlipidemia    takes Pravastatin daily  . Hypertension    takes Amlodipine,Metoprolol,and Losartan daily  . Osteoporosis   . PONV (postoperative nausea and vomiting)    yrs ago  . Slow urinary stream    takes Rapaflo daily  . TIA (transient ischemic attack) 1995   no residual effects noted    Past Surgical History:  Procedure Laterality Date  . APPENDECTOMY     patient unsure of date  . BACK SURGERY    . BLADDER REPAIR    . CHOLECYSTECTOMY     Patient unsure of date.  . CORONARY ANGIOPLASTY  11/16/2001   1 stent  . ESOPHAGEAL MANOMETRY N/A 03/25/2013   Procedure: ESOPHAGEAL MANOMETRY (EM);  Surgeon: Inda Castle, MD;  Location: [facility];  Service: Endoscopy;  Laterality: N/A;  . ESOPHAGOGASTRODUODENOSCOPY N/A 03/15/2013   Procedure: ESOPHAGOGASTRODUODENOSCOPY (EGD);  Surgeon: Jerene Bears, MD;  Location: [facility] ENDOSCOPY;  Service: Endoscopy;  Laterality: N/A;  . EYE SURGERY  11/2000   bilateral cataracts with lens implant  . FIXATION KYPHOPLASTY LUMBAR SPINE  X 2   "L3-4"  . HEMORRHOID SURGERY    . HIATAL HERNIA REPAIR  06/03/2011   Procedure: LAPAROSCOPIC REPAIR OF HIATAL HERNIA;  Surgeon: Adin Hector, MD;  Location: [facility] ORS;  Service: General;  Laterality: N/A;  . HIATAL HERNIA REPAIR N/A 04/24/2013   Procedure: LAPAROSCOPIC  REPAIR RECURRENT PARASOPHAGEAL HIATAL HERNIA WITH FUNDOPLICATION;  Surgeon: Adin Hector, MD;  Location: [facility] ORS;  Service: General;  Laterality: N/A;  . INSERTION OF MESH N/A 04/24/2013   Procedure: INSERTION OF MESH;  Surgeon: Adin Hector, MD;  Location: [facility] ORS;  Service: General;  Laterality: N/A;  . LAPAROSCOPIC LYSIS OF ADHESIONS N/A 04/24/2013   Procedure: LAPAROSCOPIC LYSIS OF ADHESIONS;  Surgeon: Adin Hector, MD;  Location: [facility]  ORS;  Service: General;  Laterality: N/A;  . LAPAROSCOPIC NISSEN FUNDOPLICATION  [id number]   Procedure: LAPAROSCOPIC NISSEN FUNDOPLICATION;  Surgeon: Adin Hector, MD;  Location: [facility] OR[facility];  Service: General;  Laterality: N/A;  . LUMBAR FUSION  12/07/2015   Right L2-3 facetectomy, posterolateral fusion L2-3 with pedicle screws, rods, local bone graft, Vivigen cancellous chips. Fusion extended to the L1 level with pedicle screws and rods  . LUMBAR LAMINECTOMY/DECOMPRESSION MICRODISCECTOMY Right 06/26/2012   Procedure: LUMBAR LAMINECTOMY/DECOMPRESSION MICRODISCECTOMY;  Surgeon: Jessy Oto, MD;  Location: [facility];  Service: Orthopedics;  Laterality: Right;  RIGHT L4-5 MICRODISCECTOMY  . LUMBAR LAMINECTOMY/DECOMPRESSION MICRODISCECTOMY N/A 05/29/2015   Procedure: RIGHT L2-3 MICRODISCECTOMY;  Surgeon: Jessy Oto, MD;  Location: [place];  Service: Orthopedics;  Laterality: N/A;  . TOTAL ABDOMINAL HYSTERECTOMY  1975   partial    Prior to Admission medications   Medication Sig Start Date End Date Taking? Authorizing Provider  albuterol (PROVENTIL HFA;VENTOLIN HFA) 108 (90 BASE) MCG/ACT inhaler Inhale 1-2 puffs into the lungs every 6 (six) hours as needed for wheezing or shortness of breath.   Yes Historical Provider, MD  ALPRAZolam Duanne Moron) 0.5 MG tablet Take 1/2 to 1 tablet 2 or 3 x daily if needed for nerves 07/07/15  Yes [name], MD  amLODipine (NORVASC) 10 MG tablet Take 1 tablet (10 mg total) by mouth at bedtime. Patient taking differently: Take 10 mg by mouth every morning.  09/20/14  Yes Reyne Dumas, MD  Cholecalciferol (VITAMIN D3) 2000 UNITS TABS Take 6,000 Units by mouth daily.    Yes Historical Provider, MD  docusate sodium (COLACE) 250 MG capsule Take 1 capsule (250 mg total) by mouth 2 (two) times daily. 03/03/15  Yes Junius Creamer, NP  feeding supplement, GLUCERNA SHAKE, (GLUCERNA SHAKE) LIQD Take 237 mLs by mouth 3 (three) times daily between meals. Patient taking differently: Take  237 mLs by mouth daily as needed (for nutrition).  12/05/13  Yes Shanker Kristeen Mans, MD  gabapentin (NEURONTIN) 100 MG capsule Take 1 capsule (100 mg total) by mouth 3 (three) times daily. 09/28/15 09/27/16 Yes [name], MD  gabapentin (NEURONTIN) 300 MG capsule Take 300 mg by mouth at bedtime.   Yes Historical Provider, MD  ipratropium-albuterol (DUONEB) 0.5-2.5 (3) MG/3ML SOLN USE ONE VIAL IN NEBULIZER 4 TIMES DAILY Patient taking differently: Inhale 3 mLs into the lungs every 6 (six) hours as needed. For shortness of breath 02/26/14  Yes [name], MD  losartan (COZAAR) 100 MG tablet TAKE ONE TABLET BY MOUTH ONCE DAILY ( DISCONTINUE BENICAR) 09/30/15  Yes Vicie Mutters, PA-C  metFORMIN (GLUCOPHAGE-XR) 500 MG 24 hr tablet TAKE ONE TO TWO TABLETS BY MOUTH TWICE DAILY WITH MEALS FOR DIABETES Patient taking differently: Take 500 mg by mouth twice daily 04/03/15  Yes [name], MD  metoprolol (LOPRESSOR) 50 MG tablet TAKE ONE TABLET BY MOUTH TWICE DAILY FOR BLOOD PRESSURE Patient taking differently: take 0.5 tablet by mouth at bedtime 02/27/14  Yes [name], MD  mometasone-formoterol (DULERA) 200-5 MCG/ACT AERO Inhale 2 puffs into the lungs 2 (two) times daily. Patient taking differently: Inhale 2 puffs into the lungs 2 (two) times daily as needed for wheezing.  07/03/13  Yes Melissa Smith, PA-C  ondansetron (ZOFRAN ODT) 4 MG disintegrating tablet Take 1 tablet (4 mg total) by mouth every 8 (eight) hours as needed for nausea or vomiting. 12/11/15  Yes Jessy Oto, MD  OVER THE COUNTER MEDICATION Take 1,000 mg by mouth 2 (two) times daily. Coral Calcium 1000mg    Yes Historical Provider, MD  OVER THE COUNTER MEDICATION Take 400 mg by mouth 2 (two) times daily. Magnesium Carbonate 400mg    Yes Historical Provider, MD  oxyCODONE (OXY IR/ROXICODONE) 5 MG immediate release tablet Take 1 tablet (5 mg total) by mouth every 4 (four) hours as needed for severe pain or breakthrough pain. 12/11/15   Yes Jessy Oto, MD  oxyCODONE (OXYCONTIN) 10 mg 12 hr tablet Take 1 tablet (10 mg total) by mouth every 12 (twelve) hours. 12/11/15  Yes Jessy Oto, MD  pantoprazole (PROTONIX) 40 MG tablet Take 1 tablet (40 mg total) by mouth daily. 03/18/15  Yes [name], MD  polyethylene glycol (MIRALAX / GLYCOLAX) packet Take 17 g by mouth 2 (two) times daily. 12/11/15  Yes Jessy Oto, MD  pravastatin (PRAVACHOL) 40 MG tablet Take 1 tablet (40 mg total) by mouth daily. 07/09/15  Yes [name], MD  senna (SENOKOT) 8.6 MG TABS tablet Take 1 tablet by mouth 2 (two) times daily.  12/05/13  Yes Shanker Kristeen Mans, MD  silodosin (RAPAFLO) 8 MG CAPS capsule Take 8 mg by mouth every other day.    Yes Historical Provider, MD  sucralfate (CARAFATE) 1 G tablet TAKE ONE TABLET BY MOUTH 4 TIMES DAILY BEFORE MEALS AND AT BEDTIME FOR HEARTBURN 10/13/14  Yes Vicie Mutters, PA-C  tobramycin (TOBREX) 0.3 % ophthalmic ointment Place into both eyes 2 (two) times daily. Place a 1/2 inch ribbon of ointment into the lower eyelid. 11/25/15  Yes Courtney Forcucci, PA-C  nitroGLYCERIN (NITROSTAT) 0.4 MG SL tablet Place 1 tablet (0.4 mg total) under the tongue every 5 (five) minutes as needed for chest pain. 09/04/14   Larey Dresser, MD  orphenadrine (NORFLEX) 100 MG tablet Take 1 tablet (100 mg total) by mouth 2 (two) times daily as needed for muscle spasms. 05/30/15   Jessy Oto, MD  oxyCODONE-acetaminophen (PERCOCET/ROXICET) 5-325 MG tablet Take 1-2 tablets by mouth every 4 (four) hours as needed for moderate pain. Patient not taking: Reported on 12/24/2015 05/30/15   Jessy Oto, MD  potassium chloride SA (K-DUR,KLOR-CON) 20 MEQ tablet Take 1 tablet (20 mEq total) by mouth 2 (two) times daily. Patient not taking: Reported on 12/24/2015 11/25/14   [name], MD  predniSONE (DELTASONE) 20 MG tablet 3 tabs po daily x 3 days, then 2 tabs x 3 days, then 1.5 tabs x 3 days, then 1 tab x 3 days, then 0.5 tabs x 3  days Patient not taking: Reported on 12/24/2015 11/25/15   Starlyn Skeans, PA-C  prochlorperazine (COMPAZINE) 5 MG tablet Take 1 capsule 3 x day before meals only if needed for Nausea Patient not taking: Reported on 12/24/2015 09/26/14   [name], MD    Scheduled Meds: . amLODipine  10 mg Oral QHS  . fluconazole (DIFLUCAN) IV  100 mg Intravenous Q24H  . gabapentin  100 mg Oral TID  . gabapentin  300 mg Oral QHS  . insulin aspart  0-9 Units Subcutaneous TID WC  . losartan  100 mg Oral Daily  . meclizine  25 mg Oral TID  . metoprolol  12.5 mg Oral BID  . pantoprazole  40 mg Oral Q1200  . polyethylene glycol  17 g Oral BID  . pravastatin  40 mg Oral Daily   Infusions: . sodium chloride 75 mL/hr at 12/24/15 1609   PRN Meds: acetaminophen **OR** acetaminophen, albuterol, ALPRAZolam, bisacodyl, ipratropium-albuterol, ketorolac, magnesium citrate, ondansetron **OR** ondansetron (ZOFRAN) IV, senna-docusate, traZODone   Allergies as of 12/23/2015 - Review Complete 12/23/2015  Allergen Reaction Noted  . Ace inhibitors Cough 04/03/2013  . Aspartame Diarrhea and Nausea And Vomiting   . Atorvastatin Other (See Comments) 09/04/2014  . Biaxin [clarithromycin] Other (See Comments) 03/07/2014  . Daliresp [roflumilast] Nausea And Vomiting 05/23/2011  . Erythromycin Other (See Comments) 03/07/2014  . Hydrocodone Nausea Only 05/16/2013  . Levofloxacin Nausea And Vomiting 11/04/2013    Family History  Problem Relation Age of Onset  . Colon cancer Mother 83  . Heart disease Father 9    heart attack    Social History   Social History  . Marital status: Married  Spouse name: [name]  . Number of children: 2  . Years of education: 12   Occupational History  . Retired Retired    worked at [employer]  . Smoking status: Never Smoker  . Smokeless tobacco: Never Used  . Alcohol use No  . Drug use: No  . Sexual activity: No   Other Topics  Concern  . Not on file   Social History Narrative   Lives with husband.   Right-handed.   3 cups caffeine per day.    REVIEW OF SYSTEMS: Constitutional:  Some weakness ENT:  No nose bleeds Pulm:  No cough or dyspnea CV:  No palpitations, no LE edema.  GU:  No hematuria, no frequency GI:  Per HPI.  Less frequent stools Heme:  No bleeding or bruising   Transfusions:  nonde Neuro:  No headaches, no peripheral tingling or numbness Derm:  No itching, no rash or sores.  Endocrine:  No sweats or chills.  No polyuria or dysuria Immunization:  Not questioned.  Travel:  None beyond local counties in last few months.    PHYSICAL EXAM: Vital signs in last 24 hours: Vitals:   12/24/15 0525 12/24/15 1451  BP: (!) 107/55 113/64  Pulse: 73 79  Resp: 16 16  Temp: 99.5 F (37.5 C) 98 F (36.7 C)   Wt Readings from Last 3 Encounters:  12/23/15 66.2 kg (146 lb)  12/07/15 66.2 kg (146 lb)  12/04/15 66.6 kg (146 lb 12.8 oz)    General: somewhat frail but not acutely ill looking Head:  No asymmetry or swelling  Eyes:  No icterus or pallor Ears:  Not HOH  Nose:  No discharge Mouth:  Full dentures.  Clear and moist mucosa.  No yeast.  Neck:  No mass, no TMG Lungs:  Clear bil.  Wearing plastic "gladiator" type back brace.  No cough or dyspnea Heart: RRR.  No mrg.  S1/s2 present Abdomen:  Soft, NT, ND.  Active BS.   Rectal: deferred   Musc/Skeltl: no joint swelling or redness Extremities:  No CCE  Neurologic:  Alert, oriented x 3.  no tremor.  Moves all 4s, strength not tested.  Skin:  No rash or sores Psych:  Pleasant, callm.   Intake/Output from previous day: 08/16 0701 - 08/17 0700 In: 50 [IV Piggyback:50] Out: 200 [Urine:200] Intake/Output this shift: Total I/O In: 120 [P.O.:120] Out: -   LAB RESULTS:  Recent Labs  12/23/15 1212 12/24/15 0310  WBC 7.9 6.5  HGB 13.3 11.9*  HCT 40.8 37.7  PLT 554* 522*   BMET Lab Results  Component Value Date   NA 136  12/24/2015   NA 135 12/23/2015   NA 135 12/11/2015   K 3.8 12/24/2015   K 3.9 12/23/2015   K 3.7 12/11/2015   CL 106 12/24/2015   CL 103 12/23/2015   CL 105 12/11/2015   CO2 21 (L) 12/24/2015   CO2 22 12/23/2015   CO2 22 12/11/2015   GLUCOSE 92 12/24/2015   GLUCOSE 137 (H) 12/23/2015   GLUCOSE 166 (H) 12/11/2015   BUN 8 12/24/2015   BUN 7 12/23/2015   BUN 14 12/11/2015   CREATININE 0.76 12/24/2015   CREATININE 0.73 12/23/2015   CREATININE 0.68 12/11/2015   CALCIUM 8.5 (L) 12/24/2015   CALCIUM 9.1 12/23/2015   CALCIUM 8.6 (L) 12/11/2015   LFT  Recent Labs  12/23/15 1212 12/24/15 0310  PROT 6.9 5.9*  ALBUMIN 3.6 3.1*  AST 19 15  ALT 24 19  ALKPHOS 126 101  BILITOT 0.8 0.6   PT/INR Lab Results  Component Value Date   INR 0.92 12/04/2015   INR 0.89 12/16/2014   INR 0.98 06/19/2014   Hepatitis Panel No results for input(s): HEPBSAG, HCVAB, HEPAIGM, HEPBIGM in the last 72 hours. C-Diff No components found for: CDIFF Lipase     Component Value Date/Time   LIPASE 13 12/23/2015 1212    Drugs of Abuse     Component Value Date/Time   LABOPIA POSITIVE (A) 12/03/2013 1318   COCAINSCRNUR NONE DETECTED 12/03/2013 1318   LABBENZ NONE DETECTED 12/03/2013 1318   AMPHETMU NONE DETECTED 12/03/2013 1318   THCU NONE DETECTED 12/03/2013 1318   LABBARB NONE DETECTED 12/03/2013 1318     RADIOLOGY STUDIES: Ct Abdomen Pelvis W Contrast  Result Date: 12/23/2015 CLINICAL DATA:  Diffuse abdominal pain with nausea and vomiting for the last few days. EXAM: CT ABDOMEN AND PELVIS WITH CONTRAST TECHNIQUE: Multidetector CT imaging of the abdomen and pelvis was performed using the standard protocol following bolus administration of intravenous contrast. CONTRAST:  157mL ISOVUE-300 IOPAMIDOL (ISOVUE-300) INJECTION 61% COMPARISON:  12/03/2013 FINDINGS: Lower chest:  Unremarkable. Hepatobiliary: 7 mm hypo attenuating lesion medial segment left liver slightly increased in the interval but  compatible with a tiny cyst. Gallbladder surgically absent. Common bile duct measures 13 mm diameter in the head of the pancreas, distended, but stable compared to the prior study. Pancreas: No focal mass lesion. No dilatation of the main duct. No intraparenchymal cyst. No peripancreatic edema. Spleen: No splenomegaly. No focal mass lesion. Adrenals/Urinary Tract: No adrenal nodule or mass. Volume-averaging left adrenal gland mimics a nodule. Tiny cortical cysts noted in both kidneys without enhancing renal lesion evident. No hydronephrosis. No evidence for hydroureter. The urinary bladder appears normal for the degree of distention. Stomach/Bowel: Patient is status post fundoplication with multiple pledgets evident. Circumferential wall thickening noted in the distal esophagus, at the level of the wrap. Stomach otherwise unremarkable. Duodenum is normally positioned as is the ligament of Treitz. No small bowel wall thickening. No small bowel dilatation. The terminal ileum is normal. The appendix is not visualized, but there is no edema or inflammation in the region of the cecum. Diverticular changes are noted in the left colon without evidence of diverticulitis. Vascular/Lymphatic: There is abdominal aortic atherosclerosis without aneurysm. There is no gastrohepatic or hepatoduodenal ligament lymphadenopathy. No intraperitoneal or retroperitoneal lymphadenopathy. No pelvic sidewall lymphadenopathy. Reproductive: Uterus is surgically absent. There is no adnexal mass. Other: No intraperitoneal free fluid. Musculoskeletal: Bones are diffusely demineralized. Patient is status post posterior fusion from L1-L3. Evidence vertebral augmentation seen at L2, L3, and L4. Compression deformity at L1 is stable. IMPRESSION: 1. Circumferential wall thickening in the distal esophagus, at the level of the fundoplication. While likely related to esophagitis, neoplasm can have this imaging appearance. 2. Otherwise no acute findings  in the abdomen or pelvis. Electronically Signed   By: Misty Stanley M.D.   On: 12/23/2015 14:53   Dg Esophagus  Result Date: 12/24/2015 CLINICAL DATA:  80 year old female with nausea and vomiting. EXAM: ESOPHOGRAM / BARIUM SWALLOW / BARIUM TABLET STUDY TECHNIQUE: Single contrast examination performed using thin barium liquid. The patient was observed with fluoroscopy swallowing a 13 mm barium sulphate tablet. FLUOROSCOPY TIME:  Fluoroscopy Time:  2 minutes and 6 seconds COMPARISON:  No priors. FINDINGS: Due to patient's limited mobility, a single contrast examination was performed. This demonstrated relatively normal esophageal motility in terms  of propagation of the primary peristaltic wave. Occasionally, a mild tertiary contraction was observed. No esophageal mass, hiatal hernia or esophageal ring was identified. However, a barium tablet was administered, which failed to pass through the distal third of the esophagus immediately above the gastroesophageal junction. IMPRESSION: 1. Findings are compatible with a stricture in the distal third of the esophagus immediately before the gastroesophageal junction. Correlation with endoscopy is suggested if clinically appropriate. 2. Mild nonspecific esophageal motility disorder with occasional mild tertiary contractions. Electronically Signed   By: Vinnie Langton M.D.   On: 12/24/2015 12:02    ENDOSCOPIC STUDIES: Per HPI  IMPRESSION:   *  Acute n/v of non-bloody material.  Resolved.  *   Non-passage of pill at esophagram today.  Hx of slipped Nissen requiring mesh repair of paraesophageal hernia in 2014.  The "stricture" suspected on esophagram may just be her post-op anatomy.   Her dysphagia is generally mild and intermittent.  Since fundoplication #2, has not required EGD or dilatation.  ? Oral candidiasis: on IV Diflucan.   *  S/p lumbar fusion 7/31. Post op ilus resolved.  GI sxs at time of this admission not the same as those during post op  period  *  Hx adenomatous colon polyps 03/2011.  Due repeat colonoscopy 03/2016.   PLAN:     *  EGD with possible esophageal dilation tomorrow.  Time will  Be arranged tomorrow.   *  Allow full liquids tonight.    *  Lovenox has been stopped.   Azucena Freed  12/24/2015, 4:44 PM Pager: [phone]   I have reviewed the entire case in detail with the above APP and discussed the plan in detail.  Therefore, I agree with the diagnoses recorded above. In addition,  I have personally interviewed and examined the patient and have personally reviewed any abdominal/pelvic CT scan images.  My additional thoughts are as follows:  The acute N/V appears likely viral or food poisoning.  It is improving on its own.   Chronic solid food dysphagia after prior fundoplication x 2.  I suspect esophagram findings related to surgery rather than peptic stricture.  If so, would not be amenable to dilation.    Offered her EGD with dilation if amenable lesion found.  She is agreeable.  Can go home afterwards if tolerating PO.  Discussed with medicine attending.   Nelida Meuse III Pager [phone]  Mon-Fri 8a-5p [phone] after 5p, weekends, holidays

## 2015-12-25 ENCOUNTER — Encounter (HOSPITAL_COMMUNITY): Payer: Self-pay

## 2015-12-25 ENCOUNTER — Encounter (HOSPITAL_COMMUNITY)
Admission: EM | Disposition: A | Discharge status: Home / Self Care | Payer: Self-pay | Source: Home / Self Care | Attending: Internal Medicine

## 2015-12-25 ENCOUNTER — Inpatient Hospital Stay (HOSPITAL_COMMUNITY): Payer: PPO | Admitting: Anesthesiology

## 2015-12-25 DIAGNOSIS — R1310 Dysphagia, unspecified: Secondary | ICD-10-CM

## 2015-12-25 DIAGNOSIS — E119 Type 2 diabetes mellitus without complications: Secondary | ICD-10-CM

## 2015-12-25 DIAGNOSIS — E785 Hyperlipidemia, unspecified: Secondary | ICD-10-CM | POA: Diagnosis not present

## 2015-12-25 DIAGNOSIS — M810 Age-related osteoporosis without current pathological fracture: Secondary | ICD-10-CM | POA: Diagnosis not present

## 2015-12-25 DIAGNOSIS — F324 Major depressive disorder, single episode, in partial remission: Secondary | ICD-10-CM | POA: Diagnosis not present

## 2015-12-25 DIAGNOSIS — G894 Chronic pain syndrome: Secondary | ICD-10-CM | POA: Diagnosis not present

## 2015-12-25 DIAGNOSIS — K219 Gastro-esophageal reflux disease without esophagitis: Secondary | ICD-10-CM | POA: Diagnosis not present

## 2015-12-25 DIAGNOSIS — I2583 Coronary atherosclerosis due to lipid rich plaque: Secondary | ICD-10-CM | POA: Diagnosis not present

## 2015-12-25 DIAGNOSIS — J45909 Unspecified asthma, uncomplicated: Secondary | ICD-10-CM | POA: Diagnosis not present

## 2015-12-25 DIAGNOSIS — K210 Gastro-esophageal reflux disease with esophagitis: Secondary | ICD-10-CM | POA: Diagnosis not present

## 2015-12-25 DIAGNOSIS — B370 Candidal stomatitis: Secondary | ICD-10-CM | POA: Diagnosis not present

## 2015-12-25 DIAGNOSIS — I2510 Atherosclerotic heart disease of native coronary artery without angina pectoris: Secondary | ICD-10-CM | POA: Diagnosis not present

## 2015-12-25 DIAGNOSIS — F419 Anxiety disorder, unspecified: Secondary | ICD-10-CM | POA: Diagnosis not present

## 2015-12-25 DIAGNOSIS — Z955 Presence of coronary angioplasty implant and graft: Secondary | ICD-10-CM | POA: Diagnosis not present

## 2015-12-25 DIAGNOSIS — I10 Essential (primary) hypertension: Secondary | ICD-10-CM | POA: Diagnosis not present

## 2015-12-25 DIAGNOSIS — R1110 Vomiting, unspecified: Secondary | ICD-10-CM | POA: Diagnosis present

## 2015-12-25 HISTORY — PX: ESOPHAGOGASTRODUODENOSCOPY: SHX5428

## 2015-12-25 LAB — GLUCOSE, CAPILLARY
GLUCOSE-CAPILLARY: 108 mg/dL — AB (ref 65–99)
Glucose-Capillary: 100 mg/dL — ABNORMAL HIGH (ref 65–99)
Glucose-Capillary: 99 mg/dL (ref 65–99)
Glucose-Capillary: 114 mg/dL — ABNORMAL HIGH (ref 65–99)

## 2015-12-25 SURGERY — EGD (ESOPHAGOGASTRODUODENOSCOPY)
Anesthesia: Monitor Anesthesia Care

## 2015-12-25 MED ORDER — ENOXAPARIN SODIUM 40 MG/0.4ML ~~LOC~~ SOLN
40.0000 mg | SUBCUTANEOUS | Status: DC
  Administered 2015-12-25 – 2015-12-26 (×2): 40 mg via SUBCUTANEOUS
  Filled 2015-12-25 (×2): qty 0.4

## 2015-12-25 MED ORDER — BUTAMBEN-TETRACAINE-BENZOCAINE 2-2-14 % EX AERO
INHALATION_SPRAY | CUTANEOUS | Status: DC | PRN
  Administered 2015-12-25: 1 via TOPICAL

## 2015-12-25 MED ORDER — ONDANSETRON HCL 4 MG/2ML IJ SOLN
INTRAMUSCULAR | Status: AC
  Filled 2015-12-25: qty 2

## 2015-12-25 MED ORDER — PROPOFOL 500 MG/50ML IV EMUL
INTRAVENOUS | Status: DC | PRN
  Administered 2015-12-25: 75 ug/kg/min via INTRAVENOUS

## 2015-12-25 MED ORDER — PROPOFOL 10 MG/ML IV BOLUS
INTRAVENOUS | Status: DC | PRN
  Administered 2015-12-25: 20 mg via INTRAVENOUS
  Administered 2015-12-25 (×3): 10 mg via INTRAVENOUS

## 2015-12-25 NOTE — Op Note (Signed)
[facility] Patient Name: [name] [name] Procedure Date : 12/25/2015 MRN: [id number] Attending MD: Estill Cotta. Loletha Carrow , MD Date of Birth: 29-Nov-1935 CSN: [id number] Age: 80 Admit Type: Inpatient Procedure:                Upper GI endoscopy Indications:              Dysphagia Providers:                Mallie Mussel L. Loletha Carrow, MD, Kingsley Plan, RN, William Dalton, Technician Referring MD:              Medicines:                Monitored Anesthesia Care Complications:            No immediate complications. Estimated Blood Loss:     Estimated blood loss: none. Procedure:                Pre-Anesthesia Assessment:                           - Prior to the procedure, a History and Physical                            was performed, and patient medications and                            allergies were reviewed. The patient's tolerance of                            previous anesthesia was also reviewed. The risks                            and benefits of the procedure and the sedation                            options and risks were discussed with the patient.                            All questions were answered, and informed consent                            was obtained. Prior Anticoagulants: The patient has                            taken no previous anticoagulant or antiplatelet                            agents. ASA Grade Assessment: II - A patient with                            mild systemic disease. After reviewing the risks  and benefits, the patient was deemed in                            satisfactory condition to undergo the procedure.                           After obtaining informed consent, the endoscope was                            passed under direct vision. Throughout the                            procedure, the patient's blood pressure, pulse, and                            oxygen saturations were monitored  continuously. The                            [id number]) scope was introduced through the                            mouth, and advanced to the second part of duodenum.                            The upper GI endoscopy was accomplished without                            difficulty. The patient tolerated the procedure                            well. Scope In: Scope Out: Findings:      The esophagus was normal. There was no resistance passing the scope       through the EGJ.      Evidence of a fundoplication was found in the cardia. The wrap appeared       intact.      The examined duodenum was normal. Impression:               - Normal esophagus.                           - A fundoplication was found. The wrap appears                            intact.                           - Normal examined duodenum.                           - No specimens collected.                           Chronic symptoms appear to be due to the  fundoplication and perhaps age-related decreased                            esophageal motility. No lesion amenable to dilation. Moderate Sedation:      MAC sedation used Recommendation:           - Resume previous diet.                           - Continue present medications. Procedure Code(s):        --- Professional ---                           [phone], Esophagogastroduodenoscopy, flexible,                            transoral; diagnostic, including collection of                            specimen(s) by brushing or washing, when performed                            (separate procedure) Diagnosis Code(s):        --- Professional ---                           [id number], Other specified postprocedural states                           R13.10, Dysphagia, unspecified CPT copyright 2016 [employer]. All rights reserved. The codes documented in this report are preliminary and upon coder review may  be revised to meet  current compliance requirements. Henry L. Loletha Carrow, MD 12/25/2015 10:57:37 AM This report has been signed electronically. Number of Addenda: 0

## 2015-12-25 NOTE — Anesthesia Postprocedure Evaluation (Signed)
Anesthesia Post Note  Patient: [name] [name]  Procedure(s) Performed: Procedure(s) (LRB): ESOPHAGOGASTRODUODENOSCOPY (EGD) (N/A)  Patient location during evaluation: PACU Anesthesia Type: MAC Level of consciousness: awake and alert Pain management: pain level controlled Vital Signs Assessment: post-procedure vital signs reviewed and stable Respiratory status: spontaneous breathing, nonlabored ventilation, respiratory function stable and patient connected to nasal cannula oxygen Cardiovascular status: stable and blood pressure returned to baseline Anesthetic complications: no    Last Vitals:  Vitals:   12/25/15 1115 12/25/15 1125  BP: (!) 127/53 (!) 129/52  Pulse: 71 76  Resp: 18 19  Temp:      Last Pain:  Vitals:   12/25/15 1055  TempSrc: Axillary  PainSc:                  [name]

## 2015-12-25 NOTE — Transfer of Care (Signed)
Immediate Anesthesia Transfer of Care Note  Patient: [name] [name]  Procedure(s) Performed: Procedure(s): ESOPHAGOGASTRODUODENOSCOPY (EGD) (N/A) SAVORY DILATION (N/A)  Patient Location: Endoscopy Unit  Anesthesia Type:MAC  Level of Consciousness: awake and alert   Airway & Oxygen Therapy: Patient Spontanous Breathing and Patient connected to nasal cannula oxygen  Post-op Assessment: Report given to RN, Post -op Vital signs reviewed and stable and Patient moving all extremities  Post vital signs: Reviewed and stable  Last Vitals:  Vitals:   12/25/15 0507 12/25/15 1000  BP: (!) 112/55 (!) 143/69  Pulse: 64 74  Resp: 17 16  Temp: 36.7 C 36.8 C    Last Pain:  Vitals:   12/25/15 1000  TempSrc: Oral  PainSc:       Patients Stated Pain Goal: 2 ([id number])  Complications: No apparent anesthesia complications

## 2015-12-25 NOTE — H&P (View-Only) (Signed)
[facility] Consult: 4:44 PM 12/24/2015  LOS: 0 days    Referring Provider: Dr Sloan Leiter  Primary Care Physician:  Alesia Richards, MD Primary Gastroenterologist:  Dr. Erskine Emery    Reason for Consultation:  Esophageal stricture, vomiting.    HPI: [name] [name] is a 80 y.o. female.  CAD.  PTCA in 2003.  HTN.  TIA.  Vertigo.  s/p lumbar fusion, microdiscectomy and kyphoplasty.  Chronic back pain, neurogenic claudication. Osteoporosis. .  Functional constipation.  Transfusion requiring IDA anemia.  Hx of dyphagia, esophageal stricture dilations.  Hx aspiration PNA. S/p Nissen fundoplication [id number] which slipped and underwent redo fundoplication/repair paraesophageal hernia with mesh (2.5 hours of LOA) 04/2013.   08/2013 Esophageal Motility study 03/2013 EGD.  Dr Dalbert Batman.  Dema Severin esophageal exudates:candida.  Slipped fundoplication.  03/2011 EGD with maloney dilation of distal esophageal stricture. For dysphagia.  03/2011 Colonoscopy for hx colon cancer in parent.  2 mm ascending polyp, 51mm descending polyp, internal hemorrhoids.  Path: tubular adenomas.     03/2009 Capsule endo.  Negative study with no findings to explain anemia  Just s/p 12/07/15 lumbar spine fusion.  Seen during admission for n/v/dysphagia.  N/v attributed to partial ileus.  Sxs and xray improved with senokot, Miralax, stopping Robaxin, minimizing narcotics.  Mild AST elevation noted.  Viral hepatitis serologies negative.  Discharged 12/11/15.    Readmitted 8/16 with acute onset of nausea, clear emesis (more like spit than vomit) and lots of dry heaving. Started 8/14 at 9 PM. Up until then had not been having active GI issues though appetite and po was diminished and less frequent stools (is using opioids).  Finally came to ED after about  total 15 hours of sxs and, after anti-nauseals, the sxs resolved.  Had white material on tongue and is being treated with IV Diflucan for oral candidiasis.  Tolerating clears, ready to try more advanced foods.   LFTs normal except albumin low at 3.1.  WBCs, urine normal.   8/16 CT ab/pelvis: distal esophageal thickening at level of fundoplication.   Esophagram today: mostly normal motility but occasional tertiary waves.  Tablet failed to pass through distal esophagus just above the GEjx, "findings c/w with distal esophageal stricture"   Pt says she has infrequent dysphagia to solids which is unpredictable.  Has not has EGD since fundoplication #2.  Nausea and vomiting are not normal for her.  Since the n/v incident, she is having dizziness/vertigo, even if she just turns her head.    Past Medical History:  Diagnosis Date  . Anxiety    takes Xanax daily as needed  . Asthma    Albuterol daily as needed  . CAD (coronary artery disease)    LHC 2003 with 40% pLAD, 30% mLAD, 100% D1 (moderate vessel), 100%  D2 (small vessel), 95% mid small OM2.  Pt had PTCA to D1;  D2 and OM2 small vessels and tx medically;  Last Myoview (2012): anterior infarct seen with scar, no ischemia. EF normal. Pt managed medically;  Lexiscan Myoview (12/14):  Low risk; ant scar with  very mild peri-infarct ischemia; EF 55% with ant AK   . Chronic back pain    HNP, DDD, spinal stenosis, vertebral compression fractures.   . Chronic nausea    takes Zofran daily as needed.  normal gastric emptying study 03/2013  . Constipation    felt to be functional. takes Senokot and Miralax daily.  treated with Linzess in past.   . DIVERTICULOSIS, COLON 08/28/2008   Qualifier: Diagnosis of  By: [name], [name], [name]    . Elevated LFTs 2015   probably med induced DILI, from Augmentin vs Pravastatin  . GERD (gastroesophageal reflux disease)    hx of esophageal stricture   . Hiatal hernia    Paraesophageal hernias. s/p fundoplications in  0000000 and 04/2013  . History of bronchitis several of yrs ago  . History of colon polyps 03/2009, 03/2011   adenomatous colon polyps, no high grade dysplasia.   [name] History of vertigo    no meds  . Hyperlipidemia    takes Pravastatin daily  . Hypertension    takes Amlodipine,Metoprolol,and Losartan daily  . Osteoporosis   . PONV (postoperative nausea and vomiting)    yrs ago  . Slow urinary stream    takes Rapaflo daily  . TIA (transient ischemic attack) 1995   no residual effects noted    Past Surgical History:  Procedure Laterality Date  . APPENDECTOMY     patient unsure of date  . BACK SURGERY    . BLADDER REPAIR    . CHOLECYSTECTOMY     Patient unsure of date.  . CORONARY ANGIOPLASTY  11/16/2001   1 stent  . ESOPHAGEAL MANOMETRY N/A 03/25/2013   Procedure: ESOPHAGEAL MANOMETRY (EM);  Surgeon: Inda Castle, MD;  Location: [facility] END[facility]COPY;  Service: Endoscopy;  Laterality: N/A;  . ESOPHAGOGASTRODUODENOSCOPY N/A 03/15/2013   Procedure: ESOPHAGOGASTRODUODENOSCOPY (EGD);  Surgeon: Jerene Bears, MD;  Location: [facility] ENDOSCOPY;  Service: Endoscopy;  Laterality: N/A;  . EYE SURGERY  11/2000   bilateral cataracts with lens implant  . FIXATION KYPHOPLASTY LUMBAR SPINE  X 2   "L3-4"  . HEMORRHOID SURGERY    . HIATAL HERNIA REPAIR  06/03/2011   Procedure: LAPAROSCOPIC REPAIR OF HIATAL HERNIA;  Surgeon: Adin Hector, MD;  Location: [facility] ORS;  Service: General;  Laterality: N/A;  . HIATAL HERNIA REPAIR N/A 04/24/2013   Procedure: LAPAROSCOPIC  REPAIR RECURRENT PARASOPHAGEAL HIATAL HERNIA WITH FUNDOPLICATION;  Surgeon: Adin Hector, MD;  Location: [facility] ORS;  Service: General;  Laterality: N/A;  . INSERTION OF MESH N/A 04/24/2013   Procedure: INSERTION OF MESH;  Surgeon: Adin Hector, MD;  Location: [facility] ORS;  Service: General;  Laterality: N/A;  . LAPAROSCOPIC LYSIS OF ADHESIONS N/A 04/24/2013   Procedure: LAPAROSCOPIC LYSIS OF ADHESIONS;  Surgeon: Adin Hector, MD;  Location: [facility]  ORS;  Service: General;  Laterality: N/A;  . LAPAROSCOPIC NISSEN FUNDOPLICATION  [id number]   Procedure: LAPAROSCOPIC NISSEN FUNDOPLICATION;  Surgeon: Adin Hector, MD;  Location: [facility] ORS;  Service: General;  Laterality: N/A;  . LUMBAR FUSION  12/07/2015   Right L2-3 facetectomy, posterolateral fusion L2-3 with pedicle screws, rods, local bone graft, Vivigen cancellous chips. Fusion extended to the L1 level with pedicle screws and rods  . LUMBAR LAMINECTOMY/DECOMPRESSION MICRODISCECTOMY Right 06/26/2012   Procedure: LUMBAR LAMINECTOMY/DECOMPRESSION MICRODISCECTOMY;  Surgeon: Jessy Oto, MD;  Location: [place];  Service: Orthopedics;  Laterality: Right;  RIGHT L4-5 MICRODISCECTOMY  . LUMBAR LAMINECTOMY/DECOMPRESSION MICRODISCECTOMY N/A 05/29/2015   Procedure: RIGHT L2-3 MICRODISCECTOMY;  Surgeon: Jessy Oto, MD;  Location: [place];  Service: Orthopedics;  Laterality: N/A;  . TOTAL ABDOMINAL HYSTERECTOMY  1975   partial    Prior to Admission medications   Medication Sig Start Date End Date Taking? Authorizing Provider  albuterol (PROVENTIL HFA;VENTOLIN HFA) 108 (90 BASE) MCG/ACT inhaler Inhale 1-2 puffs into the lungs every 6 (six) hours as needed for wheezing or shortness of breath.   Yes Historical Provider, MD  ALPRAZolam Duanne Moron) 0.5 MG tablet Take 1/2 to 1 tablet 2 or 3 x daily if needed for nerves 07/07/15  Yes [name], MD  amLODipine (NORVASC) 10 MG tablet Take 1 tablet (10 mg total) by mouth at bedtime. Patient taking differently: Take 10 mg by mouth every morning.  09/20/14  Yes Reyne Dumas, MD  Cholecalciferol (VITAMIN D3) 2000 UNITS TABS Take 6,000 Units by mouth daily.    Yes Historical Provider, MD  docusate sodium (COLACE) 250 MG capsule Take 1 capsule (250 mg total) by mouth 2 (two) times daily. 03/03/15  Yes Junius Creamer, NP  feeding supplement, GLUCERNA SHAKE, (GLUCERNA SHAKE) LIQD Take 237 mLs by mouth 3 (three) times daily between meals. Patient taking differently: Take  237 mLs by mouth daily as needed (for nutrition).  12/05/13  Yes Shanker Kristeen Mans, MD  gabapentin (NEURONTIN) 100 MG capsule Take 1 capsule (100 mg total) by mouth 3 (three) times daily. 09/28/15 09/27/16 Yes [name], MD  gabapentin (NEURONTIN) 300 MG capsule Take 300 mg by mouth at bedtime.   Yes Historical Provider, MD  ipratropium-albuterol (DUONEB) 0.5-2.5 (3) MG/3ML SOLN USE ONE VIAL IN NEBULIZER 4 TIMES DAILY Patient taking differently: Inhale 3 mLs into the lungs every 6 (six) hours as needed. For shortness of breath 02/26/14  Yes [name], MD  losartan (COZAAR) 100 MG tablet TAKE ONE TABLET BY MOUTH ONCE DAILY ( DISCONTINUE BENICAR) 09/30/15  Yes Vicie Mutters, PA-C  metFORMIN (GLUCOPHAGE-XR) 500 MG 24 hr tablet TAKE ONE TO TWO TABLETS BY MOUTH TWICE DAILY WITH MEALS FOR DIABETES Patient taking differently: Take 500 mg by mouth twice daily 04/03/15  Yes [name], MD  metoprolol (LOPRESSOR) 50 MG tablet TAKE ONE TABLET BY MOUTH TWICE DAILY FOR BLOOD PRESSURE Patient taking differently: take 0.5 tablet by mouth at bedtime 02/27/14  Yes [name], MD  mometasone-formoterol (DULERA) 200-5 MCG/ACT AERO Inhale 2 puffs into the lungs 2 (two) times daily. Patient taking differently: Inhale 2 puffs into the lungs 2 (two) times daily as needed for wheezing.  07/03/13  Yes Melissa Smith, PA-C  ondansetron (ZOFRAN ODT) 4 MG disintegrating tablet Take 1 tablet (4 mg total) by mouth every 8 (eight) hours as needed for nausea or vomiting. 12/11/15  Yes Jessy Oto, MD  OVER THE COUNTER MEDICATION Take 1,000 mg by mouth 2 (two) times daily. Coral Calcium 1000mg    Yes Historical Provider, MD  OVER THE COUNTER MEDICATION Take 400 mg by mouth 2 (two) times daily. Magnesium Carbonate 400mg    Yes Historical Provider, MD  oxyCODONE (OXY IR/ROXICODONE) 5 MG immediate release tablet Take 1 tablet (5 mg total) by mouth every 4 (four) hours as needed for severe pain or breakthrough pain. 12/11/15   Yes Jessy Oto, MD  oxyCODONE (OXYCONTIN) 10 mg 12 hr tablet Take 1 tablet (10 mg total) by mouth every 12 (twelve) hours. 12/11/15  Yes Jessy Oto, MD  pantoprazole (PROTONIX) 40 MG tablet Take 1 tablet (40 mg total) by mouth daily. 03/18/15  Yes [name], MD  polyethylene glycol (MIRALAX / GLYCOLAX) packet Take 17 g by mouth 2 (two) times daily. 12/11/15  Yes Jessy Oto, MD  pravastatin (PRAVACHOL) 40 MG tablet Take 1 tablet (40 mg total) by mouth daily. 07/09/15  Yes [name], MD  senna (SENOKOT) 8.6 MG TABS tablet Take 1 tablet by mouth 2 (two) times daily.  12/05/13  Yes Shanker Kristeen Mans, MD  silodosin (RAPAFLO) 8 MG CAPS capsule Take 8 mg by mouth every other day.    Yes Historical Provider, MD  sucralfate (CARAFATE) 1 G tablet TAKE ONE TABLET BY MOUTH 4 TIMES DAILY BEFORE MEALS AND AT BEDTIME FOR HEARTBURN 10/13/14  Yes Vicie Mutters, PA-C  tobramycin (TOBREX) 0.3 % ophthalmic ointment Place into both eyes 2 (two) times daily. Place a 1/2 inch ribbon of ointment into the lower eyelid. 11/25/15  Yes Courtney Forcucci, PA-C  nitroGLYCERIN (NITROSTAT) 0.4 MG SL tablet Place 1 tablet (0.4 mg total) under the tongue every 5 (five) minutes as needed for chest pain. 09/04/14   Larey Dresser, MD  orphenadrine (NORFLEX) 100 MG tablet Take 1 tablet (100 mg total) by mouth 2 (two) times daily as needed for muscle spasms. 05/30/15   Jessy Oto, MD  oxyCODONE-acetaminophen (PERCOCET/ROXICET) 5-325 MG tablet Take 1-2 tablets by mouth every 4 (four) hours as needed for moderate pain. Patient not taking: Reported on 12/24/2015 05/30/15   Jessy Oto, MD  potassium chloride SA (K-DUR,KLOR-CON) 20 MEQ tablet Take 1 tablet (20 mEq total) by mouth 2 (two) times daily. Patient not taking: Reported on 12/24/2015 11/25/14   [name], MD  predniSONE (DELTASONE) 20 MG tablet 3 tabs po daily x 3 days, then 2 tabs x 3 days, then 1.5 tabs x 3 days, then 1 tab x 3 days, then 0.5 tabs x 3  days Patient not taking: Reported on 12/24/2015 11/25/15   Starlyn Skeans, PA-C  prochlorperazine (COMPAZINE) 5 MG tablet Take 1 capsule 3 x day before meals only if needed for Nausea Patient not taking: Reported on 12/24/2015 09/26/14   [name], MD    Scheduled Meds: . amLODipine  10 mg Oral QHS  . fluconazole (DIFLUCAN) IV  100 mg Intravenous Q24H  . gabapentin  100 mg Oral TID  . gabapentin  300 mg Oral QHS  . insulin aspart  0-9 Units Subcutaneous TID WC  . losartan  100 mg Oral Daily  . meclizine  25 mg Oral TID  . metoprolol  12.5 mg Oral BID  . pantoprazole  40 mg Oral Q1200  . polyethylene glycol  17 g Oral BID  . pravastatin  40 mg Oral Daily   Infusions: . sodium chloride 75 mL/hr at 12/24/15 1609   PRN Meds: acetaminophen **OR** acetaminophen, albuterol, ALPRAZolam, bisacodyl, ipratropium-albuterol, ketorolac, magnesium citrate, ondansetron **OR** ondansetron (ZOFRAN) IV, senna-docusate, traZODone   Allergies as of 12/23/2015 - Review Complete 12/23/2015  Allergen Reaction Noted  . Ace inhibitors Cough 04/03/2013  . Aspartame Diarrhea and Nausea And Vomiting   . Atorvastatin Other (See Comments) 09/04/2014  . Biaxin [clarithromycin] Other (See Comments) 03/07/2014  . Daliresp [roflumilast] Nausea And Vomiting 05/23/2011  . Erythromycin Other (See Comments) 03/07/2014  . Hydrocodone Nausea Only 05/16/2013  . Levofloxacin Nausea And Vomiting 11/04/2013    Family History  Problem Relation Age of Onset  . Colon cancer Mother 55  . Heart disease Father 14    heart attack    Social History   Social History  . Marital status: Married  Spouse name: [name]  . Number of children: 2  . Years of education: 12   Occupational History  . Retired Retired    worked at [employer]  . Smoking status: Never Smoker  . Smokeless tobacco: Never Used  . Alcohol use No  . Drug use: No  . Sexual activity: No   Other Topics  Concern  . Not on file   Social History Narrative   Lives with husband.   Right-handed.   3 cups caffeine per day.    REVIEW OF SYSTEMS: Constitutional:  Some weakness ENT:  No nose bleeds Pulm:  No cough or dyspnea CV:  No palpitations, no LE edema.  GU:  No hematuria, no frequency GI:  Per HPI.  Less frequent stools Heme:  No bleeding or bruising   Transfusions:  nonde Neuro:  No headaches, no peripheral tingling or numbness Derm:  No itching, no rash or sores.  Endocrine:  No sweats or chills.  No polyuria or dysuria Immunization:  Not questioned.  Travel:  None beyond local counties in last few months.    PHYSICAL EXAM: Vital signs in last 24 hours: Vitals:   12/24/15 0525 12/24/15 1451  BP: (!) 107/55 113/64  Pulse: 73 79  Resp: 16 16  Temp: 99.5 F (37.5 C) 98 F (36.7 C)   Wt Readings from Last 3 Encounters:  12/23/15 66.2 kg (146 lb)  12/07/15 66.2 kg (146 lb)  12/04/15 66.6 kg (146 lb 12.8 oz)    General: somewhat frail but not acutely ill looking Head:  No asymmetry or swelling  Eyes:  No icterus or pallor Ears:  Not HOH  Nose:  No discharge Mouth:  Full dentures.  Clear and moist mucosa.  No yeast.  Neck:  No mass, no TMG Lungs:  Clear bil.  Wearing plastic "gladiator" type back brace.  No cough or dyspnea Heart: RRR.  No mrg.  S1/s2 present Abdomen:  Soft, NT, ND.  Active BS.   Rectal: deferred   Musc/Skeltl: no joint swelling or redness Extremities:  No CCE  Neurologic:  Alert, oriented x 3.  no tremor.  Moves all 4s, strength not tested.  Skin:  No rash or sores Psych:  Pleasant, callm.   Intake/Output from previous day: 08/16 0701 - 08/17 0700 In: 50 [IV Piggyback:50] Out: 200 [Urine:200] Intake/Output this shift: Total I/O In: 120 [P.O.:120] Out: -   LAB RESULTS:  Recent Labs  12/23/15 1212 12/24/15 0310  WBC 7.9 6.5  HGB 13.3 11.9*  HCT 40.8 37.7  PLT 554* 522*   BMET Lab Results  Component Value Date   NA 136  12/24/2015   NA 135 12/23/2015   NA 135 12/11/2015   K 3.8 12/24/2015   K 3.9 12/23/2015   K 3.7 12/11/2015   CL 106 12/24/2015   CL 103 12/23/2015   CL 105 12/11/2015   CO2 21 (L) 12/24/2015   CO2 22 12/23/2015   CO2 22 12/11/2015   GLUCOSE 92 12/24/2015   GLUCOSE 137 (H) 12/23/2015   GLUCOSE 166 (H) 12/11/2015   BUN 8 12/24/2015   BUN 7 12/23/2015   BUN 14 12/11/2015   CREATININE 0.76 12/24/2015   CREATININE 0.73 12/23/2015   CREATININE 0.68 12/11/2015   CALCIUM 8.5 (L) 12/24/2015   CALCIUM 9.1 12/23/2015   CALCIUM 8.6 (L) 12/11/2015   LFT  Recent Labs  12/23/15 1212 12/24/15 0310  PROT 6.9 5.9*  ALBUMIN 3.6 3.1*  AST 19 15  ALT 24 19  ALKPHOS 126 101  BILITOT 0.8 0.6   PT/INR Lab Results  Component Value Date   INR 0.92 12/04/2015   INR 0.89 12/16/2014   INR 0.98 06/19/2014   Hepatitis Panel No results for input(s): HEPBSAG, HCVAB, HEPAIGM, HEPBIGM in the last 72 hours. C-Diff No components found for: CDIFF Lipase     Component Value Date/Time   LIPASE 13 12/23/2015 1212    Drugs of Abuse     Component Value Date/Time   LABOPIA POSITIVE (A) 12/03/2013 1318   COCAINSCRNUR NONE DETECTED 12/03/2013 1318   LABBENZ NONE DETECTED 12/03/2013 1318   AMPHETMU NONE DETECTED 12/03/2013 1318   THCU NONE DETECTED 12/03/2013 1318   LABBARB NONE DETECTED 12/03/2013 1318     RADIOLOGY STUDIES: Ct Abdomen Pelvis W Contrast  Result Date: 12/23/2015 CLINICAL DATA:  Diffuse abdominal pain with nausea and vomiting for the last few days. EXAM: CT ABDOMEN AND PELVIS WITH CONTRAST TECHNIQUE: Multidetector CT imaging of the abdomen and pelvis was performed using the standard protocol following bolus administration of intravenous contrast. CONTRAST:  133mL ISOVUE-300 IOPAMIDOL (ISOVUE-300) INJECTION 61% COMPARISON:  12/03/2013 FINDINGS: Lower chest:  Unremarkable. Hepatobiliary: 7 mm hypo attenuating lesion medial segment left liver slightly increased in the interval but  compatible with a tiny cyst. Gallbladder surgically absent. Common bile duct measures 13 mm diameter in the head of the pancreas, distended, but stable compared to the prior study. Pancreas: No focal mass lesion. No dilatation of the main duct. No intraparenchymal cyst. No peripancreatic edema. Spleen: No splenomegaly. No focal mass lesion. Adrenals/Urinary Tract: No adrenal nodule or mass. Volume-averaging left adrenal gland mimics a nodule. Tiny cortical cysts noted in both kidneys without enhancing renal lesion evident. No hydronephrosis. No evidence for hydroureter. The urinary bladder appears normal for the degree of distention. Stomach/Bowel: Patient is status post fundoplication with multiple pledgets evident. Circumferential wall thickening noted in the distal esophagus, at the level of the wrap. Stomach otherwise unremarkable. Duodenum is normally positioned as is the ligament of Treitz. No small bowel wall thickening. No small bowel dilatation. The terminal ileum is normal. The appendix is not visualized, but there is no edema or inflammation in the region of the cecum. Diverticular changes are noted in the left colon without evidence of diverticulitis. Vascular/Lymphatic: There is abdominal aortic atherosclerosis without aneurysm. There is no gastrohepatic or hepatoduodenal ligament lymphadenopathy. No intraperitoneal or retroperitoneal lymphadenopathy. No pelvic sidewall lymphadenopathy. Reproductive: Uterus is surgically absent. There is no adnexal mass. Other: No intraperitoneal free fluid. Musculoskeletal: Bones are diffusely demineralized. Patient is status post posterior fusion from L1-L3. Evidence vertebral augmentation seen at L2, L3, and L4. Compression deformity at L1 is stable. IMPRESSION: 1. Circumferential wall thickening in the distal esophagus, at the level of the fundoplication. While likely related to esophagitis, neoplasm can have this imaging appearance. 2. Otherwise no acute findings  in the abdomen or pelvis. Electronically Signed   By: Misty Stanley M.D.   On: 12/23/2015 14:53   Dg Esophagus  Result Date: 12/24/2015 CLINICAL DATA:  80 year old female with nausea and vomiting. EXAM: ESOPHOGRAM / BARIUM SWALLOW / BARIUM TABLET STUDY TECHNIQUE: Single contrast examination performed using thin barium liquid. The patient was observed with fluoroscopy swallowing a 13 mm barium sulphate tablet. FLUOROSCOPY TIME:  Fluoroscopy Time:  2 minutes and 6 seconds COMPARISON:  No priors. FINDINGS: Due to patient's limited mobility, a single contrast examination was performed. This demonstrated relatively normal esophageal motility in terms  of propagation of the primary peristaltic wave. Occasionally, a mild tertiary contraction was observed. No esophageal mass, hiatal hernia or esophageal ring was identified. However, a barium tablet was administered, which failed to pass through the distal third of the esophagus immediately above the gastroesophageal junction. IMPRESSION: 1. Findings are compatible with a stricture in the distal third of the esophagus immediately before the gastroesophageal junction. Correlation with endoscopy is suggested if clinically appropriate. 2. Mild nonspecific esophageal motility disorder with occasional mild tertiary contractions. Electronically Signed   By: Vinnie Langton M.D.   On: 12/24/2015 12:02    ENDOSCOPIC STUDIES: Per HPI  IMPRESSION:   *  Acute n/v of non-bloody material.  Resolved.  *   Non-passage of pill at esophagram today.  Hx of slipped Nissen requiring mesh repair of paraesophageal hernia in 2014.  The "stricture" suspected on esophagram may just be her post-op anatomy.   Her dysphagia is generally mild and intermittent.  Since fundoplication #2, has not required EGD or dilatation.  ? Oral candidiasis: on IV Diflucan.   *  S/p lumbar fusion 7/31. Post op ilus resolved.  GI sxs at time of this admission not the same as those during post op  period  *  Hx adenomatous colon polyps 03/2011.  Due repeat colonoscopy 03/2016.   PLAN:     *  EGD with possible esophageal dilation tomorrow.  Time will  Be arranged tomorrow.   *  Allow full liquids tonight.    *  Lovenox has been stopped.   Azucena Freed  12/24/2015, 4:44 PM Pager: [phone]   I have reviewed the entire case in detail with the above APP and discussed the plan in detail.  Therefore, I agree with the diagnoses recorded above. In addition,  I have personally interviewed and examined the patient and have personally reviewed any abdominal/pelvic CT scan images.  My additional thoughts are as follows:  The acute N/V appears likely viral or food poisoning.  It is improving on its own.   Chronic solid food dysphagia after prior fundoplication x 2.  I suspect esophagram findings related to surgery rather than peptic stricture.  If so, would not be amenable to dilation.    Offered her EGD with dilation if amenable lesion found.  She is agreeable.  Can go home afterwards if tolerating PO.  Discussed with medicine attending.   Nelida Meuse III Pager [phone]  Mon-Fri 8a-5p [phone] after 5p, weekends, holidays

## 2015-12-25 NOTE — Interval H&P Note (Signed)
History and Physical Interval Note:  12/25/2015 10:26 AM  [name] [name]  has presented today for surgery, with the diagnosis of ? esophageal stricture,  vomiting and occasional difficulty swallowing  The various methods of treatment have been discussed with the patient and family. After consideration of risks, benefits and other options for treatment, the patient has consented to  Procedure(s): ESOPHAGOGASTRODUODENOSCOPY (EGD) (N/A) SAVORY DILATION (N/A) as a surgical intervention .  The patient's history has been reviewed, patient examined, no change in status, stable for surgery.  I have reviewed the patient's chart and labs.  Questions were answered to the patient's satisfaction.     Nelida Meuse III

## 2015-12-25 NOTE — Progress Notes (Signed)
PROGRESS NOTE        PATIENT DETAILS Name: [name] [name] Age: 80 y.o. Sex: female Date of Birth: 1935/09/01 Admit Date: 12/23/2015 Admitting Physician Costin Karlyne Greenspan, MD [id number] DAVID, MD  Brief Narrative: Patient is a 80 y.o. female primary esophageal stricture with dilatation, history of GERD, hiatal hernia status post fundoplication in [id number] who was admitted to the hospital on 8/16 with sudden onset of vomiting and epigastric discomfort. On admission she was observed to have some thrush and empirically started on Diflucan. Vomiting has resolved with supportive measures, she is still nauseous. She underwent a barium esophagogram which showed a stricture, endoscopy was completed on 8/18 without any obvious stricture. Plans are to advance diet, mobilize patient and probably discharge on 8/19 if clinical improvement continues.  Subjective: Sinuses much better, still nauseous but no vomiting. Denies any abdominal pain.  Assessment/Plan: Vomiting:? Etiology-likely secondary to viral gastroenteritis. Symptoms are improving with supportive care, she is only nauseous but no vomiting has improved and lipase levels were within normal limits, CT scan of the abdomen on admission did not show any acute abnormalities. We will restart clear liquids today, and advance diet. If she continues to do well, suspect she could be discharged on 8/19.  Esophagelal stricture: She has a known hx of esophageal stricture requiring endoscopic dilatation, a barium esophagogram was done on 8/17 which showed a possible esophageal stricture, she underwent an endoscopy today that does not show stricture. Plans are to advance diet.  Dizziness: Improving with supportive care. Have asked RN to check orthostatics, await PT eval. CT head negative.  Oral thrush: Continue Diflucan, seems to have improved already-I do not see any evidence of thrush on my exam this morning. Endoscopy did not  show any esophageal candidiasis, suspect we could do a short course of Diflucan.   History of hiatal hernia-status post fundoplication 2: CT of the abdomen scan demonstrates circumferential thickening in the distal esophagus-on PPI-GI consulted.  GERD: Continue PPI  Type 2 diabetes: CBGs stable with SSI-continue to hold metformin for now.  Hypertension: Controlled-continue metoprolol, amlodipine and losartan.  Dyslipidemia: Continue statin  History of chronic back pain: Continue Neurontin, as needed Toradol-patient is status post recent lumbar faectomy and fusion by Dr Louanne Skye.  Depression/anxiety: Continue Xanax  History of asthma: Stable lungs are clear-continue as needed bronchodilators  DVT Prophylaxis: Prophylactic Lovenox  Code Status: Full code  Family Communication: None at bedside-had spoken with patient's spouse yesterday  Disposition Plan: Remain inpatient-home when vomiting resolves  Antimicrobial agents: Diflucan 8/16>>  Procedures: Barium esophagogram 8/17-stricture in the distal third of the esophagus Endoscopy 8/18-no stricture  CONSULTS:  GI  Time spent: 25 minutes-Greater than 50% of this time was spent in counseling, explanation of diagnosis, planning of further management, and coordination of care.  MEDICATIONS: Anti-infectives    Start     Dose/Rate Route Frequency Ordered Stop   12/23/15 1900  [MAR Hold]  fluconazole (DIFLUCAN) IVPB 100 mg     (MAR Hold since 12/25/15 0945)   100 mg 50 mL/hr over 60 Minutes Intravenous Every 24 hours 12/23/15 1754        Scheduled Meds: . [MAR Hold] amLODipine  10 mg Oral QHS  . [MAR Hold] fluconazole (DIFLUCAN) IV  100 mg Intravenous Q24H  . [MAR Hold] gabapentin  100 mg Oral TID  . [MAR Hold] gabapentin  300 mg Oral QHS  . [MAR Hold] insulin aspart  0-9 Units Subcutaneous TID WC  . [MAR Hold] losartan  100 mg Oral Daily  . [MAR Hold] meclizine  25 mg Oral TID  . [MAR Hold] metoprolol  12.5 mg Oral BID   . [MAR Hold] pantoprazole  40 mg Oral Q1200  . [MAR Hold] polyethylene glycol  17 g Oral BID  . [MAR Hold] pravastatin  40 mg Oral Daily   Continuous Infusions: . sodium chloride 75 mL/hr at 12/24/15 2216   PRN Meds:.[MAR Hold] acetaminophen **OR** [MAR Hold] acetaminophen, [MAR Hold] albuterol, [MAR Hold] ALPRAZolam, [MAR Hold] bisacodyl, [MAR Hold] ipratropium-albuterol, [MAR Hold] ketorolac, [MAR Hold] magnesium citrate, [MAR Hold] ondansetron **OR** [MAR Hold] ondansetron (ZOFRAN) IV, [MAR Hold] senna-docusate, [MAR Hold] traZODone   PHYSICAL EXAM: Vital signs: Vitals:   12/25/15 1055 12/25/15 1105 12/25/15 1115 12/25/15 1125  BP: (!) 130/51 (!) 121/46 (!) 127/53 (!) 129/52  Pulse: 80 73 71 76  Resp: 20 15 18 19   Temp: 98.4 F (36.9 C)     TempSrc: Axillary     SpO2: 99% 100% 100% 100%  Weight:      Height:       Filed Weights   12/23/15 1210 12/25/15 1000  Weight: 66.2 kg (146 lb) 66.2 kg (146 lb)   Body mass index is 24.3 kg/m.   Gen Exam: Awake and alert with clear speech. Not in any distress  Neck: Supple, No JVD.   Chest: B/L Clear.   CVS: S1 S2 Regular, no murmurs.  Abdomen: soft, BS +, non tender, non distended.  Extremities: no edema, lower extremities warm to touch. Neurologic: Non Focal.   Skin: No Rash or lesions   Wounds: N/A.  I have personally reviewed following labs and imaging studies  LABORATORY DATA: CBC:  Recent Labs Lab 12/23/15 1212 12/24/15 0310  WBC 7.9 6.5  HGB 13.3 11.9*  HCT 40.8 37.7  MCV 95.3 95.0  PLT 554* 522*    Basic Metabolic Panel:  Recent Labs Lab 12/23/15 1212 12/24/15 0310  NA 135 136  K 3.9 3.8  CL 103 106  CO2 22 21*  GLUCOSE 137* 92  BUN 7 8  CREATININE 0.73 0.76  CALCIUM 9.1 8.5*    GFR: Estimated Creatinine Clearance: 52.2 mL/min (by C-G formula based on SCr of 0.8 mg/dL).  Liver Function Tests:  Recent Labs Lab 12/23/15 1212 12/24/15 0310  AST 19 15  ALT 24 19  ALKPHOS 126 101    BILITOT 0.8 0.6  PROT 6.9 5.9*  ALBUMIN 3.6 3.1*    Recent Labs Lab 12/23/15 1212  LIPASE 13   No results for input(s): AMMONIA in the last 168 hours.  Coagulation Profile: No results for input(s): INR, PROTIME in the last 168 hours.  Cardiac Enzymes: No results for input(s): CKTOTAL, CKMB, CKMBINDEX, TROPONINI in the last 168 hours.  BNP (last 3 results) No results for input(s): PROBNP in the last 8760 hours.  HbA1C:  Recent Labs  12/23/15 1812  HGBA1C 5.5    CBG:  Recent Labs Lab 12/24/15 0737 12/24/15 1204 12/24/15 1706 12/24/15 2212 12/25/15 0837  GLUCAP 90 102* 93 87 100*    Lipid Profile: No results for input(s): CHOL, HDL, LDLCALC, TRIG, CHOLHDL, LDLDIRECT in the last 72 hours.  Thyroid Function Tests: No results for input(s): TSH, T4TOTAL, FREET4, T3FREE, THYROIDAB in the last 72 hours.  Anemia Panel: No results for input(s): VITAMINB12, FOLATE, FERRITIN, TIBC, IRON, RETICCTPCT in the last  72 hours.  Urine analysis:    Component Value Date/Time   COLORURINE YELLOW 12/09/2015 [place] 12/09/2015 1542   LABSPEC 1.016 12/09/2015 1542   PHURINE 7.5 12/09/2015 1542   GLUCOSEU NEGATIVE 12/09/2015 1542   HGBUR NEGATIVE 12/09/2015 1542   BILIRUBINUR NEGATIVE 12/09/2015 1542   KETONESUR 15 (A) 12/09/2015 1542   PROTEINUR NEGATIVE 12/09/2015 1542   UROBILINOGEN 0.2 09/19/2014 2235   NITRITE NEGATIVE 12/09/2015 1542   LEUKOCYTESUR NEGATIVE 12/09/2015 1542    Sepsis Labs: Lactic Acid, Venous    Component Value Date/Time   LATICACIDVEN 2.5 (H) 03/12/2013 0101    MICROBIOLOGY: No results found for this or any previous visit (from the past 240 hour(s)).  RADIOLOGY STUDIES/RESULTS: Dg Chest 2 View  Result Date: 12/09/2015 CLINICAL DATA:  Productive cough, status post recent back surgery EXAM: CHEST  2 VIEW COMPARISON:  12/04/2015 FINDINGS: Cardiac shadow is stable. The lungs are well aerated bilaterally. No focal infiltrate or  sizable effusion is seen. No acute bony abnormality is noted. IMPRESSION: No active cardiopulmonary disease. Electronically Signed   By: Inez Catalina M.D.   On: 12/09/2015 09:19   Dg Chest 2 View  Result Date: 12/04/2015 CLINICAL DATA:  Preoperative evaluation for lumbar fusion. Hypertension. EXAM: CHEST  2 VIEW COMPARISON:  Sep 19, 2014 and August 30, 2014 FINDINGS: There is slight scarring in the left base region laterally. There is no edema or consolidation. Heart size and pulmonary vascularity are normal. No adenopathy. There is atherosclerotic calcification in the aorta. There is anterior wedging of a lower thoracic vertebral body. Patient has had an upper lumbar vertebral body kyphoplasty procedure. IMPRESSION: Mild scarring left base. No edema or consolidation. Stable cardiac silhouette. Aortic atherosclerosis. Electronically Signed   By: Lowella Grip III M.D.   On: 12/04/2015 09:20  Dg Lumbar Spine 2-3 Views  Result Date: 12/07/2015 CLINICAL DATA:  L2-3 fusion EXAM: LUMBAR SPINE - 2-3 VIEW; DG C-ARM GT 120 MIN COMPARISON:  05/29/2015 FLUOROSCOPY TIME:  Radiation Exposure Index (as provided by the fluoroscopic device): Not available If the device does not provide the exposure index: Fluoroscopy Time:  35 seconds Number of Acquired Images:  4 FINDINGS: Changes of vertebral augmentation are noted at L2, L3 and L4. Pedicle screws are noted at L1, L2 and L3 with posterior fixation. Chronic compression deformity at L1 is noted as well. IMPRESSION: Intraoperative fusion. Electronically Signed   By: Inez Catalina M.D.   On: 12/07/2015 11:39  Dg Abd 1 View  Result Date: 12/10/2015 CLINICAL DATA:  Follow-up ileus. EXAM: ABDOMEN - 1 VIEW COMPARISON:  Yesterday FINDINGS: No dilated bowel today. Bowel gas pattern is normal. No abnormal stool retention or impaction. No suspicious gas collection. Cholecystectomy clips, lumbar fusion, and multilevel vertebroplasty. IMPRESSION: Normal bowel gas pattern.  Electronically Signed   By: Monte Fantasia M.D.   On: 12/10/2015 08:17   Dg Abd 1 View  Result Date: 12/09/2015 CLINICAL DATA:  Nausea and vomiting today. Back surgery 2 days ago. Pain. EXAM: ABDOMEN - 1 VIEW COMPARISON:  Intraoperative spine films 12/07/2015. MRI lumbar spine 09/10/2015. FINDINGS: Mildly distended loops of small and large bowel favored to represent ileus. Moderate stool in the cecum. Previous vertebral augmentation at L2, L3, and L4, with more recently posterior pedicle screw fixation from L1 through L3 bilaterally. IMPRESSION: Question ileus.  No visible signs of bowel obstruction. Electronically Signed   By: Staci Righter M.D.   On: 12/09/2015 15:27   Ct Head Wo Contrast  Result Date: 12/24/2015 CLINICAL DATA:  Dizziness since this morning worsened by movement EXAM: CT HEAD WITHOUT CONTRAST TECHNIQUE: Contiguous axial images were obtained from the base of the skull through the vertex without intravenous contrast. COMPARISON:  01/09/2014 FINDINGS: Generalized atrophy. Normal ventricular morphology. No midline shift or mass effect. Small vessel chronic ischemic changes of deep cerebral white matter. No intracranial hemorrhage, mass lesion, or acute infarction. Tiny amount of dependent fluid within the sphenoid sinus. Visualized paranasal sinuses and mastoid air cells otherwise clear. Bones unremarkable. Atherosclerotic calcification of internal carotid and vertebral arteries at skullbase. IMPRESSION: Atrophy with small vessel chronic ischemic changes of deep cerebral white matter. No acute intracranial abnormalities. Electronically Signed   By: Lavonia Dana M.D.   On: 12/24/2015 20:00   Ct Abdomen Pelvis W Contrast  Result Date: 12/23/2015 CLINICAL DATA:  Diffuse abdominal pain with nausea and vomiting for the last few days. EXAM: CT ABDOMEN AND PELVIS WITH CONTRAST TECHNIQUE: Multidetector CT imaging of the abdomen and pelvis was performed using the standard protocol following bolus  administration of intravenous contrast. CONTRAST:  160mL ISOVUE-300 IOPAMIDOL (ISOVUE-300) INJECTION 61% COMPARISON:  12/03/2013 FINDINGS: Lower chest:  Unremarkable. Hepatobiliary: 7 mm hypo attenuating lesion medial segment left liver slightly increased in the interval but compatible with a tiny cyst. Gallbladder surgically absent. Common bile duct measures 13 mm diameter in the head of the pancreas, distended, but stable compared to the prior study. Pancreas: No focal mass lesion. No dilatation of the main duct. No intraparenchymal cyst. No peripancreatic edema. Spleen: No splenomegaly. No focal mass lesion. Adrenals/Urinary Tract: No adrenal nodule or mass. Volume-averaging left adrenal gland mimics a nodule. Tiny cortical cysts noted in both kidneys without enhancing renal lesion evident. No hydronephrosis. No evidence for hydroureter. The urinary bladder appears normal for the degree of distention. Stomach/Bowel: Patient is status post fundoplication with multiple pledgets evident. Circumferential wall thickening noted in the distal esophagus, at the level of the wrap. Stomach otherwise unremarkable. Duodenum is normally positioned as is the ligament of Treitz. No small bowel wall thickening. No small bowel dilatation. The terminal ileum is normal. The appendix is not visualized, but there is no edema or inflammation in the region of the cecum. Diverticular changes are noted in the left colon without evidence of diverticulitis. Vascular/Lymphatic: There is abdominal aortic atherosclerosis without aneurysm. There is no gastrohepatic or hepatoduodenal ligament lymphadenopathy. No intraperitoneal or retroperitoneal lymphadenopathy. No pelvic sidewall lymphadenopathy. Reproductive: Uterus is surgically absent. There is no adnexal mass. Other: No intraperitoneal free fluid. Musculoskeletal: Bones are diffusely demineralized. Patient is status post posterior fusion from L1-L3. Evidence vertebral augmentation seen at  L2, L3, and L4. Compression deformity at L1 is stable. IMPRESSION: 1. Circumferential wall thickening in the distal esophagus, at the level of the fundoplication. While likely related to esophagitis, neoplasm can have this imaging appearance. 2. Otherwise no acute findings in the abdomen or pelvis. Electronically Signed   By: Misty Stanley M.D.   On: 12/23/2015 14:53   Dg Esophagus  Result Date: 12/24/2015 CLINICAL DATA:  80 year old female with nausea and vomiting. EXAM: ESOPHOGRAM / BARIUM SWALLOW / BARIUM TABLET STUDY TECHNIQUE: Single contrast examination performed using thin barium liquid. The patient was observed with fluoroscopy swallowing a 13 mm barium sulphate tablet. FLUOROSCOPY TIME:  Fluoroscopy Time:  2 minutes and 6 seconds COMPARISON:  No priors. FINDINGS: Due to patient's limited mobility, a single contrast examination was performed. This demonstrated relatively normal esophageal motility in terms of propagation of the primary  peristaltic wave. Occasionally, a mild tertiary contraction was observed. No esophageal mass, hiatal hernia or esophageal ring was identified. However, a barium tablet was administered, which failed to pass through the distal third of the esophagus immediately above the gastroesophageal junction. IMPRESSION: 1. Findings are compatible with a stricture in the distal third of the esophagus immediately before the gastroesophageal junction. Correlation with endoscopy is suggested if clinically appropriate. 2. Mild nonspecific esophageal motility disorder with occasional mild tertiary contractions. Electronically Signed   By: Vinnie Langton M.D.   On: 12/24/2015 12:02   Dg C-arm Gt 120 Min  Result Date: 12/07/2015 CLINICAL DATA:  L2-3 fusion EXAM: LUMBAR SPINE - 2-3 VIEW; DG C-ARM GT 120 MIN COMPARISON:  05/29/2015 FLUOROSCOPY TIME:  Radiation Exposure Index (as provided by the fluoroscopic device): Not available If the device does not provide the exposure index:  Fluoroscopy Time:  35 seconds Number of Acquired Images:  4 FINDINGS: Changes of vertebral augmentation are noted at L2, L3 and L4. Pedicle screws are noted at L1, L2 and L3 with posterior fixation. Chronic compression deformity at L1 is noted as well. IMPRESSION: Intraoperative fusion. Electronically Signed   By: Inez Catalina M.D.   On: 12/07/2015 11:39    LOS: 1 day   Oren Binet, MD  [facility] Pager:[phone]  If 7PM-7AM, please contact night-coverage [email or web address] Password [facility] 12/25/2015, 11:42 AM

## 2015-12-25 NOTE — Anesthesia Preprocedure Evaluation (Signed)
Anesthesia Evaluation  Patient identified by MRN, date of birth, ID band Patient awake    Reviewed: Allergy & Precautions, NPO status , Patient's Chart, lab work & pertinent test results  History of Anesthesia Complications (+) PONV  Airway Mallampati: II  TM Distance: >3 FB Neck ROM: Full    Dental no notable dental hx.    Pulmonary neg pulmonary ROS,    Pulmonary exam normal breath sounds clear to auscultation       Cardiovascular hypertension, + CAD  Normal cardiovascular exam Rhythm:Regular Rate:Normal     Neuro/Psych PSYCHIATRIC DISORDERS Anxiety TIA   GI/Hepatic Neg liver ROS, GERD  ,  Endo/Other  negative endocrine ROS  Renal/GU negative Renal ROS  negative genitourinary   Musculoskeletal  (+) Arthritis ,   Abdominal   Peds negative pediatric ROS (+)  Hematology negative hematology ROS (+)   Anesthesia Other Findings NPO appropriate, has not had vomiting or nausea in over 24 hours, initial symptoms were like increased saliva and dry heaving..   Reproductive/Obstetrics negative OB ROS                             Anesthesia Physical  Anesthesia Plan  ASA: III  Anesthesia Plan: MAC   Post-op Pain Management:    Induction: Intravenous  Airway Management Planned: Nasal Cannula  Additional Equipment:   Intra-op Plan:   Post-operative Plan:   Informed Consent: I have reviewed the patients History and Physical, chart, labs and discussed the procedure including the risks, benefits and alternatives for the proposed anesthesia with the patient or authorized representative who has indicated his/her understanding and acceptance.   Dental advisory given  Plan Discussed with: CRNA and Surgeon  Anesthesia Plan Comments:         Anesthesia Quick Evaluation

## 2015-12-25 NOTE — Evaluation (Signed)
Physical Therapy Evaluation Patient Details Name: [name] [name] MRN: [id number] DOB: Aug 09, 1935 Today's Date: 12/25/2015   History of Present Illness  S/P L2-3 PLIF end of July admitted to hospital for epigastric discomfort as well as nausea and vomiting  Clinical Impression  Patient presents with limited tolerance to mobility due to pain and abdominal cramps and diarrhea.  Seems her dizzy spells have resolved, but unable to complete exam due to symptoms.  Will continue skilled PT in the acute setting to address issues and assure able to return home with spouse asssist.    Follow Up Recommendations Home health PT;Supervision/Assistance - 24 hour    Equipment Recommendations  None recommended by PT    Recommendations for Other Services       Precautions / Restrictions Precautions Precautions: Fall;Back Required Braces or Orthoses: Spinal Brace Spinal Brace: Thoracolumbosacral orthotic;Applied in sitting position      Mobility  Bed Mobility Overal bed mobility: Needs Assistance Bed Mobility: Rolling;Sidelying to Sit Rolling: Supervision Sidelying to sit: Min guard     Sit to sidelying: Min guard General bed mobility comments: assist for guiding legs into bed and for stabilizing trunk to upright  Transfers Overall transfer level: Needs assistance Equipment used: Rolling walker (2 wheeled) Transfers: Sit to/from Stand Sit to Stand: Min guard         General transfer comment: assist up from EOB and from 3:1 over toilet, one episode LOB with standing during hygiene due to hands on front of walker and tipped forward, mod A for recovery  Ambulation/Gait Ambulation/Gait assistance: Min guard Ambulation Distance (Feet): 10 Feet Assistive device: Rolling walker (2 wheeled) Gait Pattern/deviations: Trunk flexed;Decreased stride length     General Gait Details: LE's sinking some with pain as walking to/from bathroom  Stairs            Wheelchair Mobility     Modified Rankin (Stroke Patients Only)       Balance     Sitting balance-Leahy Scale: Fair     Standing balance support: Bilateral upper extremity supported Standing balance-Leahy Scale: Poor Standing balance comment: UE support for balance                             Pertinent Vitals/Pain Faces Pain Scale: Hurts whole lot Pain Location: back Pain Intervention(s): Limited activity within patient's tolerance;Monitored during session;Repositioned;Patient requesting pain meds-RN notified    Home Living Family/patient expects to be discharged to:: Private residence Living Arrangements: Spouse/significant other Available Help at Discharge: Family;Available 24 hours/day Type of Home: House Home Access: Stairs to enter Entrance Stairs-Rails: None Entrance Stairs-Number of Steps: 3 Home Layout: One level Home Equipment: Walker - 2 wheels;Crutches;Bedside commode;Shower seat;Grab bars - tub/shower      Prior Function Level of Independence: Needs assistance   Gait / Transfers Assistance Needed: Uses cane or rolling walker at times. was working with [facility] PT PTA  ADL's / Homemaking Assistance Needed: assist with LB ADLs since recent back surgery        Hand Dominance        Extremity/Trunk Assessment   Upper Extremity Assessment: Defer to OT evaluation           Lower Extremity Assessment: Generalized weakness      Cervical / Trunk Assessment: Other exceptions  Communication   Communication: 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  Cognition Arousal/Alertness: Awake/alert Behavior During Therapy: WFL for tasks assessed/performed Overall Cognitive Status: Within Functional Limits for tasks assessed  General Comments General comments (skin integrity, edema, etc.): Patient reports had bout of vertigo yesterday when looking ot to R to computer and none since; seated VOR, oculomotor assessment with saccades and smooth pursuits and alighment WNL.   Did note lighheadedness with about 5 head turns with VOR; VOR cancellation intact, note positive head thrust test to L and c/o symptoms with head thrust to R and L.  Note no nystagmus with L modified hallpike, but R not performed due to pt with abdominal cramping and diarrhea and back pain.  Evaluation limited due to not feeling well.    Exercises        Assessment/Plan    PT Assessment Patient needs continued PT services  PT Diagnosis Generalized weakness;Acute pain   PT Problem List Decreased balance;Pain;Decreased knowledge of use of DME;Decreased mobility;Decreased strength  PT Treatment Interventions     PT Goals (Current goals can be found in the Care Plan section) Acute Rehab PT Goals Patient Stated Goal: To go home PT Goal Formulation: With patient/family Time For Goal Achievement: 01/01/16 Potential to Achieve Goals: Good    Frequency Min 3X/week   Barriers to discharge        Co-evaluation               End of Session Equipment Utilized During Treatment: Back brace Activity Tolerance: Patient limited by pain Patient left: in bed;with call bell/phone within reach;with family/visitor present Nurse Communication: Patient requests pain meds         Time: [id number] PT Time Calculation (min) (ACUTE ONLY): 24 min   Charges:   PT Evaluation $PT Eval Moderate Complexity: 1 Procedure PT Treatments $Neuromuscular Re-education: 8-22 mins   PT G CodesReginia Naas 01-15-16, 3:34 PM  Magda Kiel, [place] 01-15-16

## 2015-12-26 DIAGNOSIS — F3341 Major depressive disorder, recurrent, in partial remission: Secondary | ICD-10-CM

## 2015-12-26 DIAGNOSIS — I10 Essential (primary) hypertension: Secondary | ICD-10-CM | POA: Diagnosis not present

## 2015-12-26 DIAGNOSIS — R1110 Vomiting, unspecified: Secondary | ICD-10-CM | POA: Diagnosis not present

## 2015-12-26 LAB — GLUCOSE, CAPILLARY
GLUCOSE-CAPILLARY: 139 mg/dL — AB (ref 65–99)
GLUCOSE-CAPILLARY: 125 mg/dL — AB (ref 65–99)
Glucose-Capillary: 92 mg/dL (ref 65–99)

## 2015-12-26 MED ORDER — OXYCODONE HCL 5 MG PO TABS: 5.0000 mg | ORAL_TABLET | ORAL | 0 refills | Status: DC | PRN

## 2015-12-26 MED ORDER — SENNOSIDES-DOCUSATE SODIUM 8.6-50 MG PO TABS: 2.0000 | ORAL_TABLET | Freq: Two times a day (BID) | ORAL | 1 refills | Status: DC

## 2015-12-26 MED ORDER — PROCHLORPERAZINE MALEATE 5 MG PO TABS: ORAL_TABLET | ORAL | 0 refills | Status: DC

## 2015-12-26 MED ORDER — ONDANSETRON 4 MG PO TBDP: 4.0000 mg | ORAL_TABLET | Freq: Three times a day (TID) | ORAL | 1 refills | Status: DC | PRN

## 2015-12-26 MED ORDER — FLUCONAZOLE 150 MG PO TABS: 150.0000 mg | ORAL_TABLET | Freq: Every day | ORAL | 0 refills | Status: DC

## 2015-12-26 MED ORDER — MORPHINE SULFATE (PF) 4 MG/ML IV SOLN
4.0000 mg | Freq: Once | INTRAVENOUS | Status: AC
  Administered 2015-12-26: 4 mg via INTRAVENOUS
  Filled 2015-12-26: qty 1

## 2015-12-26 MED ORDER — OXYCODONE-ACETAMINOPHEN 5-325 MG PO TABS: 2.0000 | ORAL_TABLET | Freq: Once | ORAL | Status: DC

## 2015-12-26 MED ORDER — OXYCODONE HCL ER 10 MG PO T12A: 10.0000 mg | EXTENDED_RELEASE_TABLET | Freq: Two times a day (BID) | ORAL | 0 refills | Status: DC

## 2015-12-26 MED ORDER — HYDROMORPHONE HCL 1 MG/ML IJ SOLN
1.0000 mg | Freq: Once | INTRAMUSCULAR | Status: AC
  Administered 2015-12-26: 1 mg via INTRAVENOUS
  Filled 2015-12-26: qty 1

## 2015-12-26 MED ORDER — ACETAMINOPHEN 325 MG PO TABS: 650.0000 mg | ORAL_TABLET | Freq: Four times a day (QID) | ORAL | 0 refills | Status: DC | PRN

## 2015-12-26 NOTE — Care Management Note (Signed)
Case Management Note  Patient Details  Name: [name] [name] MRN: [id number] Date of Birth: 1936-04-25  Subjective/Objective:                  intractable nausea and vomiting  Action/Plan: Cm notes that patient was previously active with [facility]. Resumption orders received for PT/OT/RN. Cm called Tiffany with advanced home care and referral accepted. No further CM needs identified at this time.   Expected Discharge Date:  12/28/15               Expected Discharge Plan:  [place]  In-House Referral:     Discharge planning Services  CM Consult  Post Acute Care Choice:  Home Health Choice offered to:  Patient  DME Arranged:    DME Agency:     HH Arranged:  RN, PT, OT HH Agency:  [facility]  Status of Service:  Completed, signed off  If discussed at [place] of Stay Meetings, dates discussed:    Additional Comments:  Guido Sander, RN 12/26/2015, 2:59 PM

## 2015-12-26 NOTE — Progress Notes (Addendum)
Discharge instructions  gone over with patient and spouse.  Home medications reviewed. Prescriptions given. Follow up appointment is to be made. Signs and symptoms of worsening condition discussed. Reasons to call the doctor discussed. Patient will continue with [place].  Patient verbalized understanding of instructions.

## 2015-12-26 NOTE — Discharge Summary (Signed)
[name] [name], is a 80 y.o. female  DOB 28-Feb-1936  MRN [id number].  Admission date:  12/23/2015  Admitting Physician  Caren Griffins, MD  Discharge Date:  12/26/2015   Primary MD  Alesia Richards, MD  Recommendations for primary care physician for things to follow:   Admission Diagnosis  Esophagitis [K20.9] Nausea and vomiting [R11.2] Intractable vomiting with nausea, vomiting of unspecified type [R11.10]   Discharge Diagnosis  Esophagitis [K20.9] Nausea and vomiting [R11.2] Intractable vomiting with nausea, vomiting of unspecified type [R11.10]    Principal Problem:   Nausea and vomiting Active Problems:   Essential hypertension   Asthma   Constipation, chronic   Prediabetes   Osteoporosis   Coronary artery disease due to lipid rich plaque   Depression, major, in partial remission (HCC)   Chronic pain syndrome   Uncontrollable vomiting   Dysphagia      Past Medical History:  Diagnosis Date  . Anxiety    takes Xanax daily as needed  . Asthma    Albuterol daily as needed  . CAD (coronary artery disease)    LHC 2003 with 40% pLAD, 30% mLAD, 100% D1 (moderate vessel), 100%  D2 (small vessel), 95% mid small OM2.  Pt had PTCA to D1;  D2 and OM2 small vessels and tx medically;  Last Myoview (2012): anterior infarct seen with scar, no ischemia. EF normal. Pt managed medically;  Lexiscan Myoview (12/14):  Low risk; ant scar with very mild peri-infarct ischemia; EF 55% with ant AK   . Chronic back pain    HNP, DDD, spinal stenosis, vertebral compression fractures.   . Chronic nausea    takes Zofran daily as needed.  normal gastric emptying study 03/2013  . Constipation    felt to be functional. takes Senokot and Miralax daily.  treated with Linzess in past.   . DIVERTICULOSIS, COLON 08/28/2008   Qualifier: Diagnosis of  By: [name], [name], [name]    . Elevated LFTs 2015   probably med  induced DILI, from Augmentin vs Pravastatin  . GERD (gastroesophageal reflux disease)    hx of esophageal stricture   . Hiatal hernia    Paraesophageal hernias. s/p fundoplications in 0000000 and 04/2013  . History of bronchitis several of yrs ago  . History of colon polyps 03/2009, 03/2011   adenomatous colon polyps, no high grade dysplasia.   [name] History of vertigo    no meds  . Hyperlipidemia    takes Pravastatin daily  . Hypertension    takes Amlodipine,Metoprolol,and Losartan daily  . Osteoporosis   . PONV (postoperative nausea and vomiting)    yrs ago  . Slow urinary stream    takes Rapaflo daily  . TIA (transient ischemic attack) 1995   no residual effects noted    Past Surgical History:  Procedure Laterality Date  . APPENDECTOMY     patient unsure of date  . BACK SURGERY    . BLADDER REPAIR    . CHOLECYSTECTOMY     Patient unsure of  date.  [name] CORONARY ANGIOPLASTY  11/16/2001   1 stent  . ESOPHAGEAL MANOMETRY N/A 03/25/2013   Procedure: ESOPHAGEAL MANOMETRY (EM);  Surgeon: Inda Castle, MD;  Location: [facility];  Service: Endoscopy;  Laterality: N/A;  . ESOPHAGOGASTRODUODENOSCOPY N/A 03/15/2013   Procedure: ESOPHAGOGASTRODUODENOSCOPY (EGD);  Surgeon: Jerene Bears, MD;  Location: [facility];  Service: Endoscopy;  Laterality: N/A;  . EYE SURGERY  11/2000   bilateral cataracts with lens implant  . FIXATION KYPHOPLASTY LUMBAR SPINE  X 2   "L3-4"  . HEMORRHOID SURGERY    . HIATAL HERNIA REPAIR  06/03/2011   Procedure: LAPAROSCOPIC REPAIR OF HIATAL HERNIA;  Surgeon: Adin Hector, MD;  Location: [facility];  Service: General;  Laterality: N/A;  . HIATAL HERNIA REPAIR N/A 04/24/2013   Procedure: LAPAROSCOPIC  REPAIR RECURRENT PARASOPHAGEAL HIATAL HERNIA WITH FUNDOPLICATION;  Surgeon: Adin Hector, MD;  Location: [facility];  Service: General;  Laterality: N/A;  . INSERTION OF MESH N/A 04/24/2013   Procedure: INSERTION OF MESH;  Surgeon: Adin Hector, MD;  Location: [facility]  [facility];  Service: General;  Laterality: N/A;  . LAPAROSCOPIC LYSIS OF ADHESIONS N/A 04/24/2013   Procedure: LAPAROSCOPIC LYSIS OF ADHESIONS;  Surgeon: Adin Hector, MD;  Location: [facility];  Service: General;  Laterality: N/A;  . LAPAROSCOPIC NISSEN FUNDOPLICATION  [id number]   Procedure: LAPAROSCOPIC NISSEN FUNDOPLICATION;  Surgeon: Adin Hector, MD;  Location: [facility];  Service: General;  Laterality: N/A;  . LUMBAR FUSION  12/07/2015   Right L2-3 facetectomy, posterolateral fusion L2-3 with pedicle screws, rods, local bone graft, Vivigen cancellous chips. Fusion extended to the L1 level with pedicle screws and rods  . LUMBAR LAMINECTOMY/DECOMPRESSION MICRODISCECTOMY Right 06/26/2012   Procedure: LUMBAR LAMINECTOMY/DECOMPRESSION MICRODISCECTOMY;  Surgeon: Jessy Oto, MD;  Location: [facility];  Service: Orthopedics;  Laterality: Right;  RIGHT L4-5 MICRODISCECTOMY  . LUMBAR LAMINECTOMY/DECOMPRESSION MICRODISCECTOMY N/A 05/29/2015   Procedure: RIGHT L2-3 MICRODISCECTOMY;  Surgeon: Jessy Oto, MD;  Location: [facility];  Service: Orthopedics;  Laterality: N/A;  . TOTAL ABDOMINAL HYSTERECTOMY  1975   partial       HPI  from the history and physical done on the day of admission:    Chief Complaint: NAusea and vomiting   HPI: [name] [name] is a 80 y.o. female with a history of posterior process, and recent vertebral compression fracture, radiculopathy, hypertension, history of esophageal stricture, MGUS, CAD, depression, chronic pain syndrome, GERD, Asthma, Prediabetes, hyperlipidemia,  with recent admission for spinal decompression on 0000000, complicated by intractable nausea and non bloody emesis with dysphagia due to partial ileus without bowel obstruction, felt to be due to post op narcotic use. She was in her usual state of health and she will last evening, after eating  [employer]. One hour later, she began to experience nausea, complicated with non bloody vomiting this morning. The  nausea was intractable. SHe has been havig similar symptoms since 2014. She had noted upper epigastric pain, and denies diarrhea or significant constipation. Last bowel movement was today. Denies fevers, chills, night sweats, vision changes, or mucositis. Denies any respiratory complaints. Denies any chest pain or palpitations. Denies lower extremity swelling.   Denies any dysuria. Denies abnormal skin rashes, or neuropathy. Denies any bleeding issues such as epistaxis, hematemesis, hematuria or hematochezia. HAs been mildly dizzy which she attributes to dehydration, no motor or sensory deficiencies     ED Course:  BP 156/61   Pulse 91   Temp 98.2 F (36.8  C) (Oral)   Resp 16   Ht 5\' 5"  (1.651 m)   Wt 66.2 kg (146 lb)   SpO2 97%   BMI 24.30 kg/m    she received Protonix IV, Zofran IV, with Phenergan, she also received Pepcid, with some control of her symptoms. She also had 500 mL bolus, and continues to receive IV fluids. EKG is negative. CT of the abdomen and pelvis shows circumferential wall thickening to the distal esophagus, admitted L of the fundoplication, likely related to esophagitis, otherwise no other acute findings are seen. Renal, liver functions are normal. Glucose 137, CBC is normal except for PLt 554  In the setting of dehydration     Hospital Course:    Brief Narrative: Patient is a 80 y.o. female primary esophageal stricture with dilatation, history of GERD, hiatal hernia status post fundoplication in [id number] who was admitted to the hospital on 8/16 with sudden onset of vomiting and epigastric discomfort. On admission she was observed to have some thrush and empirically started on Diflucan. Vomiting has resolved with supportive measures, she is still nauseous. She underwent a barium esophagogram which showed a stricture, EGD was completed on 8/18 without any obvious Esophageal stricture. diet was advanced,     Plan:-  Vomiting: Vomiting has resolved with supportive  measures, she is still nauseous. She underwent a barium esophagogram which showed a stricture, EGD was completed on 8/18 without any obvious Esophageal stricture. diet was advanced,   lipase levels were within normal limits, CT scan of the abdomen on admission did not show any acute abnormalities.    Esophagelal stricture: She has a known hx of esophageal stricture requiring endoscopic dilatation, a barium esophagogram was done on 8/17 which showed a possible esophageal stricture, she underwent an endoscopy today that does not show stricture. c/n Zofran, compazine and carafate  Dizziness:  Resolved,  CT head negative.  Oral thrush: Continue Diflucan 150 mg daily for 1 more week seems to have improved already . Endoscopy did not show any esophageal candidiasis,   History of hiatal hernia-status post fundoplication 2/GERD: CT of the abdomen scan demonstrates circumferential thickening in the distal esophagus-c/n  PPI-   Type 2 diabetes:   d/c home on metformin for now.  Hypertension: Controlled-continue metoprolol 50 mg, amlodipine 10 mg  and losartan 100 mg  Dyslipidemia: Continue Pravastatin  History of chronic back pain: Continue Neurontin, as needed oxycodone-patient is status post recent lumbar faectomy and fusion by Dr Louanne Skye.  Depression/anxiety: Continue Xanax  History of asthma: Stable lungs are clear-continue as needed bronchodilators     Discharge Condition: Stable  Follow UP     Consults obtained - GI  Diet and Activity recommendation:  As advised  Discharge Instructions     Discharge Instructions    Call MD for:  persistant dizziness or light-headedness    Complete by:  As directed   Call MD for:  persistant nausea and vomiting    Complete by:  As directed   Call MD for:  severe uncontrolled pain    Complete by:  As directed   Call MD for:  temperature >100.4    Complete by:  As directed   Diet - low sodium heart healthy    Complete by:  As directed    Diet Carb Modified    Complete by:  As directed   Discharge instructions    Complete by:  As directed   1)Take Medications as prescribed   Increase activity slowly    Complete  by:  As directed        Discharge Medications       Medication List    STOP taking these medications   docusate sodium 250 MG capsule Commonly known as:  COLACE   oxyCODONE-acetaminophen 5-325 MG tablet Commonly known as:  PERCOCET/ROXICET   predniSONE 20 MG tablet Commonly known as:  DELTASONE   senna 8.6 MG Tabs tablet Commonly known as:  SENOKOT     TAKE these medications   acetaminophen 325 MG tablet Commonly known as:  TYLENOL Take 2 tablets (650 mg total) by mouth every 6 (six) hours as needed for mild pain (or Fever >/= 101).   albuterol 108 (90 Base) MCG/ACT inhaler Commonly known as:  PROVENTIL HFA;VENTOLIN HFA Inhale 1-2 puffs into the lungs every 6 (six) hours as needed for wheezing or shortness of breath.   ALPRAZolam 0.5 MG tablet Commonly known as:  XANAX Take 1/2 to 1 tablet 2 or 3 x daily if needed for nerves   amLODipine 10 MG tablet Commonly known as:  NORVASC Take 1 tablet (10 mg total) by mouth at bedtime. What changed:  when to take this   feeding supplement (GLUCERNA SHAKE) Liqd Take 237 mLs by mouth 3 (three) times daily between meals. What changed:  when to take this  reasons to take this   fluconazole 150 MG tablet Commonly known as:  DIFLUCAN Take 1 tablet (150 mg total) by mouth daily.   gabapentin 300 MG capsule Commonly known as:  NEURONTIN Take 300 mg by mouth at bedtime.   gabapentin 100 MG capsule Commonly known as:  NEURONTIN Take 1 capsule (100 mg total) by mouth 3 (three) times daily.   ipratropium-albuterol 0.5-2.5 (3) MG/3ML Soln Commonly known as:  DUONEB USE ONE VIAL IN NEBULIZER 4 TIMES DAILY What changed:  how much to take  how to take this  when to take this  reasons to take this  additional instructions   losartan 100 MG  tablet Commonly known as:  COZAAR TAKE ONE TABLET BY MOUTH ONCE DAILY ( DISCONTINUE BENICAR)   metFORMIN 500 MG 24 hr tablet Commonly known as:  GLUCOPHAGE-XR TAKE ONE TO TWO TABLETS BY MOUTH TWICE DAILY WITH MEALS FOR DIABETES What changed:  See the new instructions.   metoprolol 50 MG tablet Commonly known as:  LOPRESSOR TAKE ONE TABLET BY MOUTH TWICE DAILY FOR BLOOD PRESSURE What changed:  See the new instructions.   mometasone-formoterol 200-5 MCG/ACT Aero Commonly known as:  DULERA Inhale 2 puffs into the lungs 2 (two) times daily. What changed:  when to take this  reasons to take this   nitroGLYCERIN 0.4 MG SL tablet Commonly known as:  NITROSTAT Place 1 tablet (0.4 mg total) under the tongue every 5 (five) minutes as needed for chest pain.   ondansetron 4 MG disintegrating tablet Commonly known as:  ZOFRAN ODT Take 1 tablet (4 mg total) by mouth every 8 (eight) hours as needed for nausea or vomiting.   orphenadrine 100 MG tablet Commonly known as:  NORFLEX Take 1 tablet (100 mg total) by mouth 2 (two) times daily as needed for muscle spasms.   OVER THE COUNTER MEDICATION Take 1,000 mg by mouth 2 (two) times daily. Coral Calcium 1000mg    OVER THE COUNTER MEDICATION Take 400 mg by mouth 2 (two) times daily. Magnesium Carbonate 400mg    oxyCODONE 10 mg 12 hr tablet Commonly known as:  OXYCONTIN Take 1 tablet (10 mg total) by mouth every 12 (twelve)  hours.   oxyCODONE 5 MG immediate release tablet Commonly known as:  Oxy IR/ROXICODONE Take 1 tablet (5 mg total) by mouth every 4 (four) hours as needed for severe pain or breakthrough pain.   pantoprazole 40 MG tablet Commonly known as:  PROTONIX Take 1 tablet (40 mg total) by mouth daily.   polyethylene glycol packet Commonly known as:  MIRALAX / GLYCOLAX Take 17 g by mouth 2 (two) times daily.   potassium chloride SA 20 MEQ tablet Commonly known as:  K-DUR,KLOR-CON Take 1 tablet (20 mEq total) by mouth 2  (two) times daily.   pravastatin 40 MG tablet Commonly known as:  PRAVACHOL Take 1 tablet (40 mg total) by mouth daily.   prochlorperazine 5 MG tablet Commonly known as:  COMPAZINE Take 1 capsule 3 x day before meals only if needed for Nausea   senna-docusate 8.6-50 MG tablet Commonly known as:  Senokot-S Take 2 tablets by mouth 2 (two) times daily.   silodosin 8 MG Caps capsule Commonly known as:  RAPAFLO Take 8 mg by mouth every other day.   sucralfate 1 g tablet Commonly known as:  CARAFATE TAKE ONE TABLET BY MOUTH 4 TIMES DAILY BEFORE MEALS AND AT BEDTIME FOR HEARTBURN   tobramycin 0.3 % ophthalmic ointment Commonly known as:  TOBREX Place into both eyes 2 (two) times daily. Place a 1/2 inch ribbon of ointment into the lower eyelid.   Vitamin D3 2000 units Tabs Take 6,000 Units by mouth daily.       Major procedures and Radiology Reports - PLEASE review detailed and final reports for all details, in brief -   Dg Chest 2 View  Result Date: 12/09/2015 CLINICAL DATA:  Productive cough, status post recent back surgery EXAM: CHEST  2 VIEW COMPARISON:  12/04/2015 FINDINGS: Cardiac shadow is stable. The lungs are well aerated bilaterally. No focal infiltrate or sizable effusion is seen. No acute bony abnormality is noted. IMPRESSION: No active cardiopulmonary disease. Electronically Signed   By: Inez Catalina M.D.   On: 12/09/2015 09:19   Dg Chest 2 View  Result Date: 12/04/2015 CLINICAL DATA:  Preoperative evaluation for lumbar fusion. Hypertension. EXAM: CHEST  2 VIEW COMPARISON:  Sep 19, 2014 and August 30, 2014 FINDINGS: There is slight scarring in the left base region laterally. There is no edema or consolidation. Heart size and pulmonary vascularity are normal. No adenopathy. There is atherosclerotic calcification in the aorta. There is anterior wedging of a lower thoracic vertebral body. Patient has had an upper lumbar vertebral body kyphoplasty procedure. IMPRESSION: Mild  scarring left base. No edema or consolidation. Stable cardiac silhouette. Aortic atherosclerosis. Electronically Signed   By: Lowella Grip III M.D.   On: 12/04/2015 09:20  Dg Lumbar Spine 2-3 Views  Result Date: 12/07/2015 CLINICAL DATA:  L2-3 fusion EXAM: LUMBAR SPINE - 2-3 VIEW; DG C-ARM GT 120 MIN COMPARISON:  05/29/2015 FLUOROSCOPY TIME:  Radiation Exposure Index (as provided by the fluoroscopic device): Not available If the device does not provide the exposure index: Fluoroscopy Time:  35 seconds Number of Acquired Images:  4 FINDINGS: Changes of vertebral augmentation are noted at L2, L3 and L4. Pedicle screws are noted at L1, L2 and L3 with posterior fixation. Chronic compression deformity at L1 is noted as well. IMPRESSION: Intraoperative fusion. Electronically Signed   By: Inez Catalina M.D.   On: 12/07/2015 11:39  Dg Abd 1 View  Result Date: 12/10/2015 CLINICAL DATA:  Follow-up ileus. EXAM: ABDOMEN - 1 VIEW  COMPARISON:  Yesterday FINDINGS: No dilated bowel today. Bowel gas pattern is normal. No abnormal stool retention or impaction. No suspicious gas collection. Cholecystectomy clips, lumbar fusion, and multilevel vertebroplasty. IMPRESSION: Normal bowel gas pattern. Electronically Signed   By: Monte Fantasia M.D.   On: 12/10/2015 08:17   Dg Abd 1 View  Result Date: 12/09/2015 CLINICAL DATA:  Nausea and vomiting today. Back surgery 2 days ago. Pain. EXAM: ABDOMEN - 1 VIEW COMPARISON:  Intraoperative spine films 12/07/2015. MRI lumbar spine 09/10/2015. FINDINGS: Mildly distended loops of small and large bowel favored to represent ileus. Moderate stool in the cecum. Previous vertebral augmentation at L2, L3, and L4, with more recently posterior pedicle screw fixation from L1 through L3 bilaterally. IMPRESSION: Question ileus.  No visible signs of bowel obstruction. Electronically Signed   By: Staci Righter M.D.   On: 12/09/2015 15:27   Ct Head Wo Contrast  Result Date: 12/24/2015 CLINICAL  DATA:  Dizziness since this morning worsened by movement EXAM: CT HEAD WITHOUT CONTRAST TECHNIQUE: Contiguous axial images were obtained from the base of the skull through the vertex without intravenous contrast. COMPARISON:  01/09/2014 FINDINGS: Generalized atrophy. Normal ventricular morphology. No midline shift or mass effect. Small vessel chronic ischemic changes of deep cerebral white matter. No intracranial hemorrhage, mass lesion, or acute infarction. Tiny amount of dependent fluid within the sphenoid sinus. Visualized paranasal sinuses and mastoid air cells otherwise clear. Bones unremarkable. Atherosclerotic calcification of internal carotid and vertebral arteries at skullbase. IMPRESSION: Atrophy with small vessel chronic ischemic changes of deep cerebral white matter. No acute intracranial abnormalities. Electronically Signed   By: Lavonia Dana M.D.   On: 12/24/2015 20:00   Ct Abdomen Pelvis W Contrast  Result Date: 12/23/2015 CLINICAL DATA:  Diffuse abdominal pain with nausea and vomiting for the last few days. EXAM: CT ABDOMEN AND PELVIS WITH CONTRAST TECHNIQUE: Multidetector CT imaging of the abdomen and pelvis was performed using the standard protocol following bolus administration of intravenous contrast. CONTRAST:  143mL ISOVUE-300 IOPAMIDOL (ISOVUE-300) INJECTION 61% COMPARISON:  12/03/2013 FINDINGS: Lower chest:  Unremarkable. Hepatobiliary: 7 mm hypo attenuating lesion medial segment left liver slightly increased in the interval but compatible with a tiny cyst. Gallbladder surgically absent. Common bile duct measures 13 mm diameter in the head of the pancreas, distended, but stable compared to the prior study. Pancreas: No focal mass lesion. No dilatation of the main duct. No intraparenchymal cyst. No peripancreatic edema. Spleen: No splenomegaly. No focal mass lesion. Adrenals/Urinary Tract: No adrenal nodule or mass. Volume-averaging left adrenal gland mimics a nodule. Tiny cortical cysts  noted in both kidneys without enhancing renal lesion evident. No hydronephrosis. No evidence for hydroureter. The urinary bladder appears normal for the degree of distention. Stomach/Bowel: Patient is status post fundoplication with multiple pledgets evident. Circumferential wall thickening noted in the distal esophagus, at the level of the wrap. Stomach otherwise unremarkable. Duodenum is normally positioned as is the ligament of Treitz. No small bowel wall thickening. No small bowel dilatation. The terminal ileum is normal. The appendix is not visualized, but there is no edema or inflammation in the region of the cecum. Diverticular changes are noted in the left colon without evidence of diverticulitis. Vascular/Lymphatic: There is abdominal aortic atherosclerosis without aneurysm. There is no gastrohepatic or hepatoduodenal ligament lymphadenopathy. No intraperitoneal or retroperitoneal lymphadenopathy. No pelvic sidewall lymphadenopathy. Reproductive: Uterus is surgically absent. There is no adnexal mass. Other: No intraperitoneal free fluid. Musculoskeletal: Bones are diffusely demineralized. Patient is status post posterior  fusion from L1-L3. Evidence vertebral augmentation seen at L2, L3, and L4. Compression deformity at L1 is stable. IMPRESSION: 1. Circumferential wall thickening in the distal esophagus, at the level of the fundoplication. While likely related to esophagitis, neoplasm can have this imaging appearance. 2. Otherwise no acute findings in the abdomen or pelvis. Electronically Signed   By: Misty Stanley M.D.   On: 12/23/2015 14:53   Dg Esophagus  Result Date: 12/24/2015 CLINICAL DATA:  80 year old female with nausea and vomiting. EXAM: ESOPHOGRAM / BARIUM SWALLOW / BARIUM TABLET STUDY TECHNIQUE: Single contrast examination performed using thin barium liquid. The patient was observed with fluoroscopy swallowing a 13 mm barium sulphate tablet. FLUOROSCOPY TIME:  Fluoroscopy Time:  2 minutes and  6 seconds COMPARISON:  No priors. FINDINGS: Due to patient's limited mobility, a single contrast examination was performed. This demonstrated relatively normal esophageal motility in terms of propagation of the primary peristaltic wave. Occasionally, a mild tertiary contraction was observed. No esophageal mass, hiatal hernia or esophageal ring was identified. However, a barium tablet was administered, which failed to pass through the distal third of the esophagus immediately above the gastroesophageal junction. IMPRESSION: 1. Findings are compatible with a stricture in the distal third of the esophagus immediately before the gastroesophageal junction. Correlation with endoscopy is suggested if clinically appropriate. 2. Mild nonspecific esophageal motility disorder with occasional mild tertiary contractions. Electronically Signed   By: Vinnie Langton M.D.   On: 12/24/2015 12:02   Dg C-arm Gt 120 Min  Result Date: 12/07/2015 CLINICAL DATA:  L2-3 fusion EXAM: LUMBAR SPINE - 2-3 VIEW; DG C-ARM GT 120 MIN COMPARISON:  05/29/2015 FLUOROSCOPY TIME:  Radiation Exposure Index (as provided by the fluoroscopic device): Not available If the device does not provide the exposure index: Fluoroscopy Time:  35 seconds Number of Acquired Images:  4 FINDINGS: Changes of vertebral augmentation are noted at L2, L3 and L4. Pedicle screws are noted at L1, L2 and L3 with posterior fixation. Chronic compression deformity at L1 is noted as well. IMPRESSION: Intraoperative fusion. Electronically Signed   By: Inez Catalina M.D.   On: 12/07/2015 11:39   Micro Results   No results found for this or any previous visit (from the past 240 hour(s)).     Today   Subjective    [name] [name] today has no new c/o, Husband at bedside, eating and drinking well, no emesis, no diarrhea, back pain better. Denies any abdominal pain. No fever or chills          Patient has been seen and examined prior to discharge   Objective   Blood  pressure (!) 126/55, pulse 72, temperature 97.6 F (36.4 C), temperature source Axillary, resp. rate 18, height 5\' 5"  (1.651 m), weight 66.2 kg (146 lb), SpO2 100 %.   Intake/Output Summary (Last 24 hours) at 12/26/15 1402 Last data filed at 12/26/15 1044  Gross per 24 hour  Intake              610 ml  Output             1825 ml  Net            -1215 ml    Exam Gen:- Awake  In no apparent distress  HEENT:- [place].AT,   Neck-Supple Neck,No JVD,  Lungs- mostly clear  CV- S1, S2 normal Abd-  +ve B.Sounds, Abd Soft, No tenderness,    Extremity/Skin:- Intact peripheral pulses   Back- Back Brace on  Data Review   CBC w Diff: Lab Results  Component Value Date   WBC 6.5 12/24/2015   HGB 11.9 (L) 12/24/2015   HGB 14.0 02/19/2013   HCT 37.7 12/24/2015   HCT 41.1 02/19/2013   PLT 522 (H) 12/24/2015   PLT 456 (H) 02/19/2013   LYMPHOPCT 6 12/11/2015   LYMPHOPCT 24.5 02/19/2013   MONOPCT 6 12/11/2015   MONOPCT 8.0 02/19/2013   EOSPCT 0 12/11/2015   EOSPCT 1.8 02/19/2013   BASOPCT 0 12/11/2015   BASOPCT 0.3 02/19/2013    CMP: Lab Results  Component Value Date   NA 136 12/24/2015   NA 139 02/19/2013   K 3.8 12/24/2015   K 2.8 (LL) 02/19/2013   CL 106 12/24/2015   CO2 21 (L) 12/24/2015   CO2 24 02/19/2013   BUN 8 12/24/2015   BUN 12.6 02/19/2013   CREATININE 0.76 12/24/2015   CREATININE 0.87 10/08/2015   CREATININE 1.0 02/19/2013   PROT 5.9 (L) 12/24/2015   PROT 8.0 02/19/2013   ALBUMIN 3.1 (L) 12/24/2015   ALBUMIN 3.9 02/19/2013   BILITOT 0.6 12/24/2015   BILITOT 0.74 02/19/2013   ALKPHOS 101 12/24/2015   ALKPHOS 87 02/19/2013   AST 15 12/24/2015   AST 12 02/19/2013   ALT 19 12/24/2015   ALT 11 02/19/2013  .   Total Discharge time is about 33 minutes  Tannis Burstein M.D on 12/26/2015 at 2:02 PM  [facility]   Office  [phone]  Dragon dictation system was used to create this note, attempts have been made to correct errors, however presence of  uncorrected errors is not a reflection quality of care provided

## 2015-12-27 ENCOUNTER — Encounter (HOSPITAL_COMMUNITY): Payer: Self-pay | Admitting: Gastroenterology

## 2015-12-27 DIAGNOSIS — E785 Hyperlipidemia, unspecified: Secondary | ICD-10-CM | POA: Diagnosis not present

## 2015-12-27 DIAGNOSIS — S32030D Wedge compression fracture of third lumbar vertebra, subsequent encounter for fracture with routine healing: Secondary | ICD-10-CM | POA: Diagnosis not present

## 2015-12-27 DIAGNOSIS — J45909 Unspecified asthma, uncomplicated: Secondary | ICD-10-CM | POA: Diagnosis not present

## 2015-12-27 DIAGNOSIS — F419 Anxiety disorder, unspecified: Secondary | ICD-10-CM | POA: Diagnosis not present

## 2015-12-27 DIAGNOSIS — S32020D Wedge compression fracture of second lumbar vertebra, subsequent encounter for fracture with routine healing: Secondary | ICD-10-CM | POA: Diagnosis not present

## 2015-12-27 DIAGNOSIS — K219 Gastro-esophageal reflux disease without esophagitis: Secondary | ICD-10-CM | POA: Diagnosis not present

## 2015-12-27 DIAGNOSIS — I2510 Atherosclerotic heart disease of native coronary artery without angina pectoris: Secondary | ICD-10-CM | POA: Diagnosis not present

## 2015-12-28 ENCOUNTER — Encounter: Payer: Self-pay | Admitting: Gastroenterology

## 2015-12-29 DIAGNOSIS — F419 Anxiety disorder, unspecified: Secondary | ICD-10-CM | POA: Diagnosis not present

## 2015-12-29 DIAGNOSIS — S32020D Wedge compression fracture of second lumbar vertebra, subsequent encounter for fracture with routine healing: Secondary | ICD-10-CM | POA: Diagnosis not present

## 2015-12-29 DIAGNOSIS — S32030D Wedge compression fracture of third lumbar vertebra, subsequent encounter for fracture with routine healing: Secondary | ICD-10-CM | POA: Diagnosis not present

## 2015-12-29 DIAGNOSIS — J45909 Unspecified asthma, uncomplicated: Secondary | ICD-10-CM | POA: Diagnosis not present

## 2015-12-29 DIAGNOSIS — E785 Hyperlipidemia, unspecified: Secondary | ICD-10-CM | POA: Diagnosis not present

## 2015-12-29 DIAGNOSIS — I2510 Atherosclerotic heart disease of native coronary artery without angina pectoris: Secondary | ICD-10-CM | POA: Diagnosis not present

## 2015-12-29 DIAGNOSIS — K219 Gastro-esophageal reflux disease without esophagitis: Secondary | ICD-10-CM | POA: Diagnosis not present

## 2015-12-30 DIAGNOSIS — M961 Postlaminectomy syndrome, not elsewhere classified: Secondary | ICD-10-CM | POA: Diagnosis not present

## 2015-12-30 DIAGNOSIS — M4726 Other spondylosis with radiculopathy, lumbar region: Secondary | ICD-10-CM | POA: Diagnosis not present

## 2015-12-30 DIAGNOSIS — Z79891 Long term (current) use of opiate analgesic: Secondary | ICD-10-CM | POA: Diagnosis not present

## 2015-12-30 DIAGNOSIS — G894 Chronic pain syndrome: Secondary | ICD-10-CM | POA: Diagnosis not present

## 2015-12-31 DIAGNOSIS — S32030D Wedge compression fracture of third lumbar vertebra, subsequent encounter for fracture with routine healing: Secondary | ICD-10-CM | POA: Diagnosis not present

## 2015-12-31 DIAGNOSIS — E785 Hyperlipidemia, unspecified: Secondary | ICD-10-CM | POA: Diagnosis not present

## 2015-12-31 DIAGNOSIS — I2510 Atherosclerotic heart disease of native coronary artery without angina pectoris: Secondary | ICD-10-CM | POA: Diagnosis not present

## 2015-12-31 DIAGNOSIS — K219 Gastro-esophageal reflux disease without esophagitis: Secondary | ICD-10-CM | POA: Diagnosis not present

## 2015-12-31 DIAGNOSIS — S32020D Wedge compression fracture of second lumbar vertebra, subsequent encounter for fracture with routine healing: Secondary | ICD-10-CM | POA: Diagnosis not present

## 2015-12-31 DIAGNOSIS — J45909 Unspecified asthma, uncomplicated: Secondary | ICD-10-CM | POA: Diagnosis not present

## 2015-12-31 DIAGNOSIS — F419 Anxiety disorder, unspecified: Secondary | ICD-10-CM | POA: Diagnosis not present

## 2016-01-01 DIAGNOSIS — E785 Hyperlipidemia, unspecified: Secondary | ICD-10-CM | POA: Diagnosis not present

## 2016-01-01 DIAGNOSIS — J45909 Unspecified asthma, uncomplicated: Secondary | ICD-10-CM | POA: Diagnosis not present

## 2016-01-01 DIAGNOSIS — F419 Anxiety disorder, unspecified: Secondary | ICD-10-CM | POA: Diagnosis not present

## 2016-01-01 DIAGNOSIS — S32030D Wedge compression fracture of third lumbar vertebra, subsequent encounter for fracture with routine healing: Secondary | ICD-10-CM | POA: Diagnosis not present

## 2016-01-01 DIAGNOSIS — K219 Gastro-esophageal reflux disease without esophagitis: Secondary | ICD-10-CM | POA: Diagnosis not present

## 2016-01-01 DIAGNOSIS — S32020D Wedge compression fracture of second lumbar vertebra, subsequent encounter for fracture with routine healing: Secondary | ICD-10-CM | POA: Diagnosis not present

## 2016-01-01 DIAGNOSIS — I2510 Atherosclerotic heart disease of native coronary artery without angina pectoris: Secondary | ICD-10-CM | POA: Diagnosis not present

## 2016-01-04 DIAGNOSIS — F419 Anxiety disorder, unspecified: Secondary | ICD-10-CM | POA: Diagnosis not present

## 2016-01-04 DIAGNOSIS — S32030D Wedge compression fracture of third lumbar vertebra, subsequent encounter for fracture with routine healing: Secondary | ICD-10-CM | POA: Diagnosis not present

## 2016-01-04 DIAGNOSIS — J45909 Unspecified asthma, uncomplicated: Secondary | ICD-10-CM | POA: Diagnosis not present

## 2016-01-04 DIAGNOSIS — K219 Gastro-esophageal reflux disease without esophagitis: Secondary | ICD-10-CM | POA: Diagnosis not present

## 2016-01-04 DIAGNOSIS — I2510 Atherosclerotic heart disease of native coronary artery without angina pectoris: Secondary | ICD-10-CM | POA: Diagnosis not present

## 2016-01-04 DIAGNOSIS — E785 Hyperlipidemia, unspecified: Secondary | ICD-10-CM | POA: Diagnosis not present

## 2016-01-04 DIAGNOSIS — S32020D Wedge compression fracture of second lumbar vertebra, subsequent encounter for fracture with routine healing: Secondary | ICD-10-CM | POA: Diagnosis not present

## 2016-01-05 ENCOUNTER — Other Ambulatory Visit: Payer: Self-pay | Admitting: Internal Medicine

## 2016-01-05 DIAGNOSIS — F419 Anxiety disorder, unspecified: Secondary | ICD-10-CM | POA: Diagnosis not present

## 2016-01-05 DIAGNOSIS — E785 Hyperlipidemia, unspecified: Secondary | ICD-10-CM | POA: Diagnosis not present

## 2016-01-05 DIAGNOSIS — J45909 Unspecified asthma, uncomplicated: Secondary | ICD-10-CM | POA: Diagnosis not present

## 2016-01-05 DIAGNOSIS — S32020D Wedge compression fracture of second lumbar vertebra, subsequent encounter for fracture with routine healing: Secondary | ICD-10-CM | POA: Diagnosis not present

## 2016-01-05 DIAGNOSIS — I2510 Atherosclerotic heart disease of native coronary artery without angina pectoris: Secondary | ICD-10-CM | POA: Diagnosis not present

## 2016-01-05 DIAGNOSIS — K219 Gastro-esophageal reflux disease without esophagitis: Secondary | ICD-10-CM | POA: Diagnosis not present

## 2016-01-05 DIAGNOSIS — S32030D Wedge compression fracture of third lumbar vertebra, subsequent encounter for fracture with routine healing: Secondary | ICD-10-CM | POA: Diagnosis not present

## 2016-01-06 ENCOUNTER — Other Ambulatory Visit: Payer: Self-pay | Admitting: *Deleted

## 2016-01-06 DIAGNOSIS — I2510 Atherosclerotic heart disease of native coronary artery without angina pectoris: Secondary | ICD-10-CM | POA: Diagnosis not present

## 2016-01-06 DIAGNOSIS — S32020D Wedge compression fracture of second lumbar vertebra, subsequent encounter for fracture with routine healing: Secondary | ICD-10-CM | POA: Diagnosis not present

## 2016-01-06 DIAGNOSIS — S32030D Wedge compression fracture of third lumbar vertebra, subsequent encounter for fracture with routine healing: Secondary | ICD-10-CM | POA: Diagnosis not present

## 2016-01-06 MED ORDER — SUCRALFATE 1 G PO TABS: ORAL_TABLET | ORAL | 1 refills | Status: DC

## 2016-01-07 DIAGNOSIS — S32030D Wedge compression fracture of third lumbar vertebra, subsequent encounter for fracture with routine healing: Secondary | ICD-10-CM | POA: Diagnosis not present

## 2016-01-07 DIAGNOSIS — E785 Hyperlipidemia, unspecified: Secondary | ICD-10-CM | POA: Diagnosis not present

## 2016-01-07 DIAGNOSIS — J45909 Unspecified asthma, uncomplicated: Secondary | ICD-10-CM | POA: Diagnosis not present

## 2016-01-07 DIAGNOSIS — S32020D Wedge compression fracture of second lumbar vertebra, subsequent encounter for fracture with routine healing: Secondary | ICD-10-CM | POA: Diagnosis not present

## 2016-01-07 DIAGNOSIS — F419 Anxiety disorder, unspecified: Secondary | ICD-10-CM | POA: Diagnosis not present

## 2016-01-07 DIAGNOSIS — K219 Gastro-esophageal reflux disease without esophagitis: Secondary | ICD-10-CM | POA: Diagnosis not present

## 2016-01-07 DIAGNOSIS — I2510 Atherosclerotic heart disease of native coronary artery without angina pectoris: Secondary | ICD-10-CM | POA: Diagnosis not present

## 2016-01-11 DIAGNOSIS — S32020D Wedge compression fracture of second lumbar vertebra, subsequent encounter for fracture with routine healing: Secondary | ICD-10-CM | POA: Diagnosis not present

## 2016-01-11 DIAGNOSIS — J45909 Unspecified asthma, uncomplicated: Secondary | ICD-10-CM | POA: Diagnosis not present

## 2016-01-11 DIAGNOSIS — K219 Gastro-esophageal reflux disease without esophagitis: Secondary | ICD-10-CM | POA: Diagnosis not present

## 2016-01-11 DIAGNOSIS — I2510 Atherosclerotic heart disease of native coronary artery without angina pectoris: Secondary | ICD-10-CM | POA: Diagnosis not present

## 2016-01-11 DIAGNOSIS — S32030D Wedge compression fracture of third lumbar vertebra, subsequent encounter for fracture with routine healing: Secondary | ICD-10-CM | POA: Diagnosis not present

## 2016-01-11 DIAGNOSIS — F419 Anxiety disorder, unspecified: Secondary | ICD-10-CM | POA: Diagnosis not present

## 2016-01-11 DIAGNOSIS — E785 Hyperlipidemia, unspecified: Secondary | ICD-10-CM | POA: Diagnosis not present

## 2016-01-12 ENCOUNTER — Other Ambulatory Visit: Payer: Self-pay | Admitting: Internal Medicine

## 2016-01-12 DIAGNOSIS — K219 Gastro-esophageal reflux disease without esophagitis: Secondary | ICD-10-CM | POA: Diagnosis not present

## 2016-01-12 DIAGNOSIS — E785 Hyperlipidemia, unspecified: Secondary | ICD-10-CM | POA: Diagnosis not present

## 2016-01-12 DIAGNOSIS — J45909 Unspecified asthma, uncomplicated: Secondary | ICD-10-CM | POA: Diagnosis not present

## 2016-01-12 DIAGNOSIS — F419 Anxiety disorder, unspecified: Secondary | ICD-10-CM | POA: Diagnosis not present

## 2016-01-12 DIAGNOSIS — S32020D Wedge compression fracture of second lumbar vertebra, subsequent encounter for fracture with routine healing: Secondary | ICD-10-CM | POA: Diagnosis not present

## 2016-01-12 DIAGNOSIS — I2510 Atherosclerotic heart disease of native coronary artery without angina pectoris: Secondary | ICD-10-CM | POA: Diagnosis not present

## 2016-01-12 DIAGNOSIS — S32030D Wedge compression fracture of third lumbar vertebra, subsequent encounter for fracture with routine healing: Secondary | ICD-10-CM | POA: Diagnosis not present

## 2016-01-13 ENCOUNTER — Other Ambulatory Visit: Payer: Self-pay | Admitting: Internal Medicine

## 2016-01-13 DIAGNOSIS — IMO0001 Reserved for inherently not codable concepts without codable children: Secondary | ICD-10-CM

## 2016-01-13 DIAGNOSIS — E559 Vitamin D deficiency, unspecified: Secondary | ICD-10-CM | POA: Diagnosis not present

## 2016-01-13 DIAGNOSIS — M5416 Radiculopathy, lumbar region: Secondary | ICD-10-CM | POA: Diagnosis not present

## 2016-01-13 DIAGNOSIS — R5381 Other malaise: Secondary | ICD-10-CM | POA: Diagnosis not present

## 2016-01-13 DIAGNOSIS — M4806 Spinal stenosis, lumbar region: Secondary | ICD-10-CM | POA: Diagnosis not present

## 2016-01-13 DIAGNOSIS — Z79899 Other long term (current) drug therapy: Secondary | ICD-10-CM | POA: Diagnosis not present

## 2016-01-14 ENCOUNTER — Other Ambulatory Visit: Payer: Self-pay | Admitting: Internal Medicine

## 2016-01-14 ENCOUNTER — Ambulatory Visit (INDEPENDENT_AMBULATORY_CARE_PROVIDER_SITE_OTHER): Payer: PPO | Admitting: Internal Medicine

## 2016-01-14 ENCOUNTER — Encounter: Payer: Self-pay | Admitting: Internal Medicine

## 2016-01-14 VITALS — BP 122/60 | HR 64 | Temp 97.3°F | Resp 16 | Ht 63.0 in | Wt 148.6 lb

## 2016-01-14 DIAGNOSIS — G894 Chronic pain syndrome: Secondary | ICD-10-CM

## 2016-01-14 DIAGNOSIS — Z79899 Other long term (current) drug therapy: Secondary | ICD-10-CM

## 2016-01-14 DIAGNOSIS — E559 Vitamin D deficiency, unspecified: Secondary | ICD-10-CM | POA: Diagnosis not present

## 2016-01-14 DIAGNOSIS — R7303 Prediabetes: Secondary | ICD-10-CM

## 2016-01-14 DIAGNOSIS — E782 Mixed hyperlipidemia: Secondary | ICD-10-CM

## 2016-01-14 DIAGNOSIS — IMO0001 Reserved for inherently not codable concepts without codable children: Secondary | ICD-10-CM

## 2016-01-14 DIAGNOSIS — K219 Gastro-esophageal reflux disease without esophagitis: Secondary | ICD-10-CM | POA: Diagnosis not present

## 2016-01-14 DIAGNOSIS — M5136 Other intervertebral disc degeneration, lumbar region: Secondary | ICD-10-CM | POA: Diagnosis not present

## 2016-01-14 DIAGNOSIS — Z23 Encounter for immunization: Secondary | ICD-10-CM

## 2016-01-14 DIAGNOSIS — I10 Essential (primary) hypertension: Secondary | ICD-10-CM | POA: Diagnosis not present

## 2016-01-14 LAB — CBC WITH DIFFERENTIAL/PLATELET
BASOS ABS: 67 {cells}/uL (ref 0–200)
Basophils Relative: 1 %
EOS PCT: 3 %
Eosinophils Absolute: 201 cells/uL (ref 15–500)
HCT: 39.8 % (ref 35.0–45.0)
Hemoglobin: 12.9 g/dL (ref 11.7–15.5)
Lymphocytes Relative: 23 %
Lymphs Abs: 1541 cells/uL (ref 850–3900)
MCH: 29.9 pg (ref 27.0–33.0)
MCHC: 32.4 g/dL (ref 32.0–36.0)
MCV: 92.1 fL (ref 80.0–100.0)
MONOS PCT: 10 %
MPV: 10.9 fL (ref 7.5–12.5)
Monocytes Absolute: 670 cells/uL (ref 200–950)
NEUTROS PCT: 63 %
Neutro Abs: 4221 cells/uL (ref 1500–7800)
PLATELETS: 426 10*3/uL — AB (ref 140–400)
RBC: 4.32 MIL/uL (ref 3.80–5.10)
RDW: 13.6 % (ref 11.0–15.0)
WBC: 6.7 10*3/uL (ref 3.8–10.8)

## 2016-01-14 LAB — BASIC METABOLIC PANEL WITH GFR
BUN: 9 mg/dL (ref 7–25)
CALCIUM: 9.4 mg/dL (ref 8.6–10.4)
CO2: 26 mmol/L (ref 20–31)
CREATININE: 0.76 mg/dL (ref 0.60–0.93)
Chloride: 104 mmol/L (ref 98–110)
GFR, Est African American: 87 mL/min (ref >=60)
GFR, Est Non African American: 75 mL/min (ref >=60)
Glucose, Bld: 116 mg/dL — ABNORMAL HIGH (ref 65–99)
Potassium: 4.2 mmol/L (ref 3.5–5.3)
SODIUM: 142 mmol/L (ref 135–146)

## 2016-01-14 LAB — HEPATIC FUNCTION PANEL
ALBUMIN: 3.8 g/dL (ref 3.6–5.1)
ALT: 10 U/L (ref 6–29)
AST: 16 U/L (ref 10–35)
Alkaline Phosphatase: 85 U/L (ref 33–130)
BILIRUBIN DIRECT: 0.1 mg/dL (ref <=0.2)
Indirect Bilirubin: 0.3 mg/dL (ref 0.2–1.2)
Total Bilirubin: 0.4 mg/dL (ref 0.2–1.2)
Total Protein: 6.7 g/dL (ref 6.1–8.1)

## 2016-01-14 LAB — LIPID PANEL
CHOL/HDL RATIO: 1.8 ratio (ref <=5.0)
CHOLESTEROL: 122 mg/dL — AB (ref 125–200)
HDL: 68 mg/dL (ref >=46)
LDL Cholesterol: 35 mg/dL (ref <130)
Triglycerides: 93 mg/dL (ref <150)
VLDL: 19 mg/dL (ref <30)

## 2016-01-14 LAB — MAGNESIUM: MAGNESIUM: 1.8 mg/dL (ref 1.5–2.5)

## 2016-01-14 LAB — TSH: TSH: 0.73 mIU/L

## 2016-01-14 NOTE — Progress Notes (Signed)
[facility]                       [name], M.D.        Uvaldo Bristle. Silverio Lay, P.A.-C       Starlyn Skeans, P.A.-C  [facility]                [address] [place] [place] [id number] Telephone [phone] Telefax [phone] ______________________________________________________________________     This very nice 80y.o.MWF presents for 6 month follow up with Hypertension, Hyperlipidemia, Pre-Diabetes and Vitamin D Deficiency. Patient has DJD/DDD and Chronic Pain Syndrome beginning since 1st Lumbar Disc surg in 2014 and more recently in Jan 2017 had a Lumbar Decompression and more recently had a Lumbar fusion in July by Dr Louanne Skye. Patient is managed by Dr Nicholaus Bloom for her Opioids.  Other problems include GERD with a Lap Nissen fundoplication and then in Dec 2014 a paraesophageal hernia repair.      Patient is treated for HTN circa 1986 & BP has been controlled at home. Today's BP is 122/60. Patient has ASCAD and in 2003 had PTCA/Stents.  Patient has had no complaints of any cardiac type chest pain, palpitations, dyspnea/orthopnea/PND, dizziness, claudication, or dependent edema.     Hyperlipidemia is controlled with diet & meds. Patient denies myalgias or other med SE's. Last Lipids were at goal: Lab Results  Component Value Date   CHOL 150 10/08/2015   HDL 73 10/08/2015   LDLCALC 58 10/08/2015   TRIG 93 10/08/2015   CHOLHDL 2.1 10/08/2015      Also, the patient has history of PreDiabetes with A1c 5.8% circa 2010 and has had no symptoms of reactive hypoglycemia, diabetic polys, paresthesias or visual blurring.  Aic in 2013 was 6.1%.  Recent occas random CBG are running 90-100 MG%.  Last A1c was at goal: Lab Results  Component Value Date   HGBA1C 5.5 12/23/2015      Further, the patient also has history of Vitamin D Deficiency of "25" in 2008  and supplements vitamin D without any suspected  side-effects. Last vitamin D was  still low: Lab Results  Component Value Date   VD25OH 46 07/07/2015   Current Outpatient Prescriptions on File Prior to Visit  Medication Sig  . acetaminophen  325 MG tab Take 2 tab every 6  hours as needed for mild pain   . albuterol HFA inhaler  1-2 puffs s every 6  hours as needed   . ALPRAZolam  0.5 MG tab TAKE 1/2-1 TAB 2-3 TIMES DAILY FOR NERVES  . amLODipine  10 MG tab Take 1 tab at bedtime  . VIT D 2000 UNITS  Take 6,000 Units  daily.   [name] gabapentin  300 MG capsule Take  at bedtime.  . DUONEB  SOLN USE ONE VIAL IN NEBULIZER 4 TIMES DAILY   . losartan (COZAAR) 100 MG tablet TAKE ONE TAB ONCE DAILY   . metFORMIN-XR 500 MG TAKE ONE TO TWO TAB TWICE DAILY WITH MEALS   . metoprolol 50 MG tablet Take 0.5 tab at bedtime  . DULERA 200-5  2 puffs into the lungs 2  times daily.  . nitroGLYCERIN  0.4 MG SL  as needed for chest pain.  [name] ZOFRAN ODT 4 MG  Take 1 tab every 8 (eight) hours as needed   .  orphenadrine  100 MG tablet Take 1 tab 2 (two) times daily as needed   . Coral Calcium 1000mg  Take  2  times daily.   . Magnesium  400mg  Take  2times daily.   [name] oxyCODONE  5 MG  Take 1 tab every 4  hrs  for severe pain or breakthrough pain.  . OXYCONTIN 10 mg 12 hr tab Take 1 tab every 12  hours.  . pantoprazole  40 MG tablet Take 1 tabdaily.  [name] MIRALAX  Take 17 g  2  times daily.  . potassium chloride  20 MEQ tab Take 1 tab 2 times daily.  . pravastatin  40 MG tab Take 1 tab daily.  . prochlorperazine  5 MG tab Take 1 cap 3 x day  if needed for Nausea  . SENOKOT-S Take 2 tab 2  times daily.  [name] RAPAFLO 8 MG CAPS cap Take   every other day.   . sucralfate  1 g tablet TAKE ONE TAB 4 TIMES DAILY    Allergies  Allergen Reactions  . Ace Inhibitors Cough    Per Dr Melvyn Novas pulmonology 2013  . Aspartame Diarrhea and Nausea And Vomiting  . Atorvastatin Other (See Comments)    Muscle aches  . Biaxin [Clarithromycin] Other (See Comments)    PATIENT CAN TOLERATE  Z-PAK.  Is written on patient's paper chart.  [name] [Roflumilast] Nausea And Vomiting  . Erythromycin Other (See Comments)    PATIENT CAN TOLERATE Z-PAK.  It is written on patient's paper chart.  . Hydrocodone Nausea Only  . Levofloxacin Nausea And Vomiting   PMHx:   Past Medical History:  Diagnosis Date  . Anxiety    takes Xanax daily as needed  . Asthma    Albuterol daily as needed  . CAD (coronary artery disease)    LHC 2003 with 40% pLAD, 30% mLAD, 100% D1 (moderate vessel), 100%  D2 (small vessel), 95% mid small OM2.  Pt had PTCA to D1;  D2 and OM2 small vessels and tx medically;  Last Myoview (2012): anterior infarct seen with scar, no ischemia. EF normal. Pt managed medically;  Lexiscan Myoview (12/14):  Low risk; ant scar with very mild peri-infarct ischemia; EF 55% with ant AK   . Chronic back pain    HNP, DDD, spinal stenosis, vertebral compression fractures.   . Chronic nausea    takes Zofran daily as needed.  normal gastric emptying study 03/2013  . Constipation    felt to be functional. takes Senokot and Miralax daily.  treated with Linzess in past.   . DIVERTICULOSIS, COLON 08/28/2008   Qualifier: Diagnosis of  By: [name], [name], [name]    . Elevated LFTs 2015   probably med induced DILI, from Augmentin vs Pravastatin  . GERD (gastroesophageal reflux disease)    hx of esophageal stricture   . Hiatal hernia    Paraesophageal hernias. s/p fundoplications in 0000000 and 04/2013  . History of bronchitis several of yrs ago  . History of colon polyps 03/2009, 03/2011   adenomatous colon polyps, no high grade dysplasia.   [name] History of vertigo    no meds  . Hyperlipidemia    takes Pravastatin daily  . Hypertension    takes Amlodipine,Metoprolol,and Losartan daily  . Osteoporosis   . PONV (postoperative nausea and vomiting)    yrs ago  . Slow urinary stream    takes Rapaflo daily  . TIA (transient ischemic attack) 1995   no residual effects noted  Immunization  History  Administered Date(s) Administered  . DT 05/06/2014  . Influenza Split 02/07/2011  . Influenza Whole 02/09/2009, 01/19/2010, 02/13/2012  . Influenza, High Dose Seasonal PF 03/22/2013, 01/15/2014, 02/27/2015  . Pneumococcal Conjugate-13 05/06/2014  . Pneumococcal Polysaccharide-23 02/09/2011  . Pneumococcal-Unspecified 02/06/2010, 02/09/2012  . Td 05/10/2003   Past Surgical History:  Procedure Laterality Date  . APPENDECTOMY     patient unsure of date  . BACK SURGERY    . BLADDER REPAIR    . CHOLECYSTECTOMY     Patient unsure of date.  . CORONARY ANGIOPLASTY  11/16/2001   1 stent  . ESOPHAGEAL MANOMETRY N/A 03/25/2013   Procedure: ESOPHAGEAL MANOMETRY (EM);  Surgeon: Inda Castle, MD;  Location: [facility] ENDOSCOPY;  Service: Endoscopy;  Laterality: N/A;  . ESOPHAGOGASTRODUODENOSCOPY N/A 03/15/2013   Procedure: ESOPHAGOGASTRODUODENOSCOPY (EGD);  Surgeon: Jerene Bears, MD;  Location[facility] ENDOSCOPY;  Service: Endoscopy;  Laterality: N/A;  . ESOPHAGOGASTRODUODENOSCOPY N/A 12/25/2015   Procedure: ESOPHAGOGASTRODUODENOSCOPY (EGD);  Surgeon: Doran Stabler, MD;  Location: [facility] ENDOSCOPY;  Service: Endoscopy;  Laterality: N/A;  . EYE SURGERY  11/2000   bilateral cataracts with lens implant  . FIXATION KYPHOPLASTY LUMBAR SPINE  X 2   "L3-4"  . HEMORRHOID SURGERY    . HIATAL HERNIA REPAIR  06/03/2011   Procedure: LAPAROSCOPIC REPAIR OF HIATAL HERNIA;  Surgeon: Adin Hector, MD;  Location: [facility] ORS;  Service: General;  Laterality: N/A;  . HIATAL HERNIA REPAIR N/A 04/24/2013   Procedure: LAPAROSCOPIC  REPAIR RECURRENT PARASOPHAGEAL HIATAL HERNIA WITH FUNDOPLICATION;  Surgeon: Adin Hector, MD;  Location: [facility] ORS;  Service: General;  Laterality: N/A;  . INSERTION OF MESH N/A 04/24/2013   Procedure: INSERTION OF MESH;  Surgeon: Adin Hector, MD;  Location: [facility] ORS;  Service: General;  Laterality: N/A;  . LAPAROSCOPIC LYSIS OF ADHESIONS N/A 04/24/2013   Procedure: LAPAROSCOPIC LYSIS OF  ADHESIONS;  Surgeon: Adin Hector, MD;  Location: [facility];  Service: General;  Laterality: N/A;  . LAPAROSCOPIC NISSEN FUNDOPLICATION  [id number]   Procedure: LAPAROSCOPIC NISSEN FUNDOPLICATION;  Surgeon: Adin Hector, MD;  Location: [facility];  Service: General;  Laterality: N/A;  . LUMBAR FUSION  12/07/2015   Right L2-3 facetectomy, posterolateral fusion L2-3 with pedicle screws, rods, local bone graft, Vivigen cancellous chips. Fusion extended to the L1 level with pedicle screws and rods  . LUMBAR LAMINECTOMY/DECOMPRESSION MICRODISCECTOMY Right 06/26/2012   Procedure: LUMBAR LAMINECTOMY/DECOMPRESSION MICRODISCECTOMY;  Surgeon: Jessy Oto, MD;  Location: [facility];  Service: Orthopedics;  Laterality: Right;  RIGHT L4-5 MICRODISCECTOMY  . LUMBAR LAMINECTOMY/DECOMPRESSION MICRODISCECTOMY N/A 05/29/2015   Procedure: RIGHT L2-3 MICRODISCECTOMY;  Surgeon: Jessy Oto, MD;  Location: [place];  Service: Orthopedics;  Laterality: N/A;  . TOTAL ABDOMINAL HYSTERECTOMY  1975   partial   FHx:    Reviewed / unchanged  SHx:    Reviewed / unchanged  Systems Review:  Constitutional: Denies fever, chills, wt changes, headaches, insomnia, fatigue, night sweats, change in appetite. Eyes: Denies redness, blurred vision, diplopia, discharge, itchy, watery eyes.  ENT: Denies discharge, congestion, post nasal drip, epistaxis, sore throat, earache, hearing loss, dental pain, tinnitus, vertigo, sinus pain, snoring.  CV: Denies chest pain, palpitations, irregular heartbeat, syncope, dyspnea, diaphoresis, orthopnea, PND, claudication or edema. Respiratory: denies cough, dyspnea, DOE, pleurisy, hoarseness, laryngitis, wheezing.  Gastrointestinal: Denies dysphagia, odynophagia, heartburn, reflux, water brash, abdominal pain or cramps, nausea, vomiting, bloating, diarrhea, constipation, hematemesis, melena, hematochezia  or hemorrhoids. Genitourinary: Denies dysuria, frequency, urgency,  nocturia, hesitancy, discharge,  hematuria or flank pain. Musculoskeletal: Denies arthralgias, myalgias, stiffness, jt. swelling, pain, limping or strain/sprain.  Skin: Denies pruritus, rash, hives, warts, acne, eczema or change in skin lesion(s). Neuro: No weakness, tremor, incoordination, spasms, paresthesia or pain. Psychiatric: Denies confusion, memory loss or sensory loss. Endo: Denies change in weight, skin or hair change.  Heme/Lymph: No excessive bleeding, bruising or enlarged lymph nodes.  Physical Exam BP 122/60   Pulse 64   Temp 97.3 F (36.3 C)   Resp 16   Ht 5\' 3"  (1.6 m)   Wt 148 lb 9.6 oz (67.4 kg)   BMI 26.32 kg/m   Appears chronically ill with a forced smile and in no distress.  Eyes: PERRLA, EOMs, conjunctiva no swelling or erythema. Sinuses: No frontal/maxillary tenderness ENT/Mouth: EAC's clear, TM's nl w/o erythema, bulging. Nares clear w/o erythema, swelling, exudates. Oropharynx clear without erythema or exudates. Oral hygiene is good. Tongue normal, non obstructing. Hearing intact.  Neck: Supple. Thyroid nl. Car 2+/2+ without bruits, nodes or JVD. Chest: Patient in a molded thoracolumbar brace. Cor: Heart sounds normal w/ regular rate and rhythm without sig. murmurs, gallops, clicks, or rubs. Peripheral pulses normal and equal  without edema.  Abdomen: Soft & bowel sounds normal. Non-tender w/o guarding, rebound, hernias, masses, or organomegaly.  Lymphatics: Unremarkable.  Musculoskeletal: Full ROM all peripheral extremities, joint stability, 5/5 strength, and normal gait.  Skin: Warm, dry without exposed rashes, lesions or ecchymosis apparent.  Neuro: Cranial nerves intact, reflexes equal bilaterally. Sensory-motor testing grossly intact. Tendon reflexes grossly intact.  Pysch: Alert & oriented x 3.  No ideations.  Assessment and Plan:  1. Essential hypertension  - Continue medication, monitor blood pressure at home. Continue DASH diet. Reminder to go to the ER if any CP, SOB, nausea,  dizziness, severe HA, changes vision/speech, left arm numbness and tingling and jaw pain. - TSH  2. Mixed hyperlipidemia  - Continue diet/meds, exercise,& lifestyle modifications. Continue monitor periodic cholesterol/liver & renal functions - Lipid panel - TSH  3. Prediabetes  - Continue diet, exercise, lifestyle modifications. Monitor appropriate labs. - Insulin, random  4. Vitamin D deficiency  - Continue supplementation. - VITAMIN D 25 Hydroxy   5. Gastroesophageal reflux disease   6. DDD , lumbar   7. Chronic pain syndrome   8. Medication management  - CBC with Differential/Platelet - BASIC METABOLIC PANEL WITH GFR - Hepatic function panel - Magnesium  9. Need for prophylactic vaccination and inoculation against influenza   - Flu vaccine HIGH DOSE PF (Fluzone High dose)   Recommended regular exercise, BP monitoring, weight control, and discussed med and SE's. Recommended labs to assess and monitor clinical status. Further disposition pending results of labs. Over 30 minutes of exam, counseling, chart review was performed

## 2016-01-14 NOTE — Patient Instructions (Signed)

## 2016-01-14 NOTE — Telephone Encounter (Signed)
RX CALLED INTO [facility].

## 2016-01-15 LAB — INSULIN, RANDOM: INSULIN: 24.3 u[IU]/mL — AB (ref 2.0–19.6)

## 2016-01-15 LAB — VITAMIN D 25 HYDROXY (VIT D DEFICIENCY, FRACTURES): VIT D 25 HYDROXY: 64 ng/mL (ref 30–100)

## 2016-01-19 DIAGNOSIS — S32030D Wedge compression fracture of third lumbar vertebra, subsequent encounter for fracture with routine healing: Secondary | ICD-10-CM | POA: Diagnosis not present

## 2016-01-19 DIAGNOSIS — K219 Gastro-esophageal reflux disease without esophagitis: Secondary | ICD-10-CM | POA: Diagnosis not present

## 2016-01-19 DIAGNOSIS — S32020D Wedge compression fracture of second lumbar vertebra, subsequent encounter for fracture with routine healing: Secondary | ICD-10-CM | POA: Diagnosis not present

## 2016-01-19 DIAGNOSIS — F419 Anxiety disorder, unspecified: Secondary | ICD-10-CM | POA: Diagnosis not present

## 2016-01-19 DIAGNOSIS — I2510 Atherosclerotic heart disease of native coronary artery without angina pectoris: Secondary | ICD-10-CM | POA: Diagnosis not present

## 2016-01-19 DIAGNOSIS — E785 Hyperlipidemia, unspecified: Secondary | ICD-10-CM | POA: Diagnosis not present

## 2016-01-19 DIAGNOSIS — J45909 Unspecified asthma, uncomplicated: Secondary | ICD-10-CM | POA: Diagnosis not present

## 2016-01-21 ENCOUNTER — Other Ambulatory Visit: Payer: Self-pay | Admitting: *Deleted

## 2016-01-21 ENCOUNTER — Telehealth: Payer: Self-pay | Admitting: Internal Medicine

## 2016-01-21 MED ORDER — GABAPENTIN 300 MG PO CAPS: 300.0000 mg | ORAL_CAPSULE | Freq: Three times a day (TID) | ORAL | 2 refills | Status: DC

## 2016-01-21 NOTE — Telephone Encounter (Signed)
Patient called reporting that her arm was sore that she got her flu shot in.  She would like some advice.

## 2016-01-28 DIAGNOSIS — M19241 Secondary osteoarthritis, right hand: Secondary | ICD-10-CM | POA: Diagnosis not present

## 2016-01-28 DIAGNOSIS — M19242 Secondary osteoarthritis, left hand: Secondary | ICD-10-CM | POA: Diagnosis not present

## 2016-01-28 DIAGNOSIS — M174 Other bilateral secondary osteoarthritis of knee: Secondary | ICD-10-CM | POA: Diagnosis not present

## 2016-01-28 DIAGNOSIS — M5137 Other intervertebral disc degeneration, lumbosacral region: Secondary | ICD-10-CM | POA: Diagnosis not present

## 2016-01-28 NOTE — Discharge Summary (Signed)
Patient ID: [name] [name] MRN: [id number] DOB/AGE: 01-15-1936 80 y.o.  Admit date: 12/07/2015 Discharge date: 01/28/2016  Admission Diagnoses:  Principal Problem:   Other forms of scoliosis, lumbar region Active Problems:   Acute right lumbar radiculopathy   Spinal stenosis of lumbar region with neurogenic claudication   [facility])   Discharge Diagnoses:  Principal Problem:   Other forms of scoliosis, lumbar region Active Problems:   Acute right lumbar radiculopathy   Spinal stenosis of lumbar region with neurogenic claudication   Ileus ([place])  status post Procedure(s): Right L2-3 facetectomy, posterolateral fusion L2-3 with pedicle screws, rods, local bone graft, Vivigen cancellous chips  Past Medical History:  Diagnosis Date  . Anxiety    takes Xanax daily as needed  . Asthma    Albuterol daily as needed  . CAD (coronary artery disease)    LHC 2003 with 40% pLAD, 30% mLAD, 100% D1 (moderate vessel), 100%  D2 (small vessel), 95% mid small OM2.  Pt had PTCA to D1;  D2 and OM2 small vessels and tx medically;  Last Myoview (2012): anterior infarct seen with scar, no ischemia. EF normal. Pt managed medically;  Lexiscan Myoview (12/14):  Low risk; ant scar with very mild peri-infarct ischemia; EF 55% with ant AK   . Chronic back pain    HNP, DDD, spinal stenosis, vertebral compression fractures.   . Chronic nausea    takes Zofran daily as needed.  normal gastric emptying study 03/2013  . Constipation    felt to be functional. takes Senokot and Miralax daily.  treated with Linzess in past.   . DIVERTICULOSIS, COLON 08/28/2008   Qualifier: Diagnosis of  By: [name], [name], [name]    . Elevated LFTs 2015   probably med induced DILI, from Augmentin vs Pravastatin  . GERD (gastroesophageal reflux disease)    hx of esophageal stricture   . Hiatal hernia    Paraesophageal hernias. s/p fundoplications in 0000000 and 04/2013  . History of bronchitis several of yrs ago  . History of  colon polyps 03/2009, 03/2011   adenomatous colon polyps, no high grade dysplasia.   [name] History of vertigo    no meds  . Hyperlipidemia    takes Pravastatin daily  . Hypertension    takes Amlodipine,Metoprolol,and Losartan daily  . Osteoporosis   . PONV (postoperative nausea and vomiting)    yrs ago  . Slow urinary stream    takes Rapaflo daily  . TIA (transient ischemic attack) 1995   no residual effects noted    Surgeries: Procedure(s): Right L2-3 facetectomy, posterolateral fusion L2-3 with pedicle screws, rods, local bone graft, Vivigen cancellous chips on 12/07/2015   Consultants:   Discharged Condition: Improved  Hospital Course: [name] [name] is an 80 y.o. female who was admitted 12/07/2015 for operative treatment of Other forms of scoliosis, lumbar region. Patient failed conservative treatments (please see the history and physical for the specifics) and had severe unremitting pain that affects sleep, daily activities and work/hobbies. After pre-op clearance, the patient was taken to the operating room on 12/07/2015 and underwent  Procedure(s): Right L2-3 facetectomy, posterolateral fusion L2-3 with pedicle screws, rods, local bone graft, Vivigen cancellous chips.    Patient was given perioperative antibiotics:  Anti-infectives    Start     Dose/Rate Route Frequency Ordered Stop   12/07/15 1930  ceFAZolin (ANCEF) IVPB 1 g/50 mL premix     1 g 100 mL/hr over 30 Minutes Intravenous Every 8 hours 12/07/15  1314 12/08/15 0432   12/07/15 0700  ceFAZolin (ANCEF) IVPB 2g/100 mL premix     2 g 200 mL/hr over 30 Minutes Intravenous To ShortStay Surgical 12/06/15 1122 12/07/15 0744       Patient was given sequential compression devices and early ambulation to prevent DVT.   Patient benefited maximally from hospital stay and there were no complications. At the time of discharge, the patient was urinating/moving their bowels without difficulty, tolerating a regular diet, pain is  controlled with oral pain medications and they have been cleared by PT/OT.   Recent vital signs: No data found.    Recent laboratory studies: No results for input(s): WBC, HGB, HCT, PLT, NA, K, CL, CO2, BUN, CREATININE, GLUCOSE, INR, CALCIUM in the last 72 hours.  Invalid input(s): PT, 2   Discharge Medications:     Medication List    STOP taking these medications   alendronate 70 MG tablet Commonly known as:  FOSAMAX   azithromycin 250 MG tablet Commonly known as:  ZITHROMAX Z-PAK   ondansetron 4 MG disintegrating tablet Commonly known as:  ZOFRAN ODT     TAKE these medications   albuterol 108 (90 Base) MCG/ACT inhaler Commonly known as:  PROVENTIL HFA;VENTOLIN HFA Inhale 1-2 puffs into the lungs every 6 (six) hours as needed for wheezing or shortness of breath.   amLODipine 10 MG tablet Commonly known as:  NORVASC Take 1 tablet (10 mg total) by mouth at bedtime. What changed:  when to take this   ipratropium-albuterol 0.5-2.5 (3) MG/3ML Soln Commonly known as:  DUONEB USE ONE VIAL IN NEBULIZER 4 TIMES DAILY What changed:  how much to take  how to take this  when to take this  reasons to take this  additional instructions   losartan 100 MG tablet Commonly known as:  COZAAR TAKE ONE TABLET BY MOUTH ONCE DAILY ( DISCONTINUE BENICAR)   metoprolol 50 MG tablet Commonly known as:  LOPRESSOR TAKE ONE TABLET BY MOUTH TWICE DAILY FOR BLOOD PRESSURE What changed:  See the new instructions.   mometasone-formoterol 200-5 MCG/ACT Aero Commonly known as:  DULERA Inhale 2 puffs into the lungs 2 (two) times daily. What changed:  when to take this  reasons to take this   nitroGLYCERIN 0.4 MG SL tablet Commonly known as:  NITROSTAT Place 1 tablet (0.4 mg total) under the tongue every 5 (five) minutes as needed for chest pain.   orphenadrine 100 MG tablet Commonly known as:  NORFLEX Take 1 tablet (100 mg total) by mouth 2 (two) times daily as needed for muscle  spasms.   OVER THE COUNTER MEDICATION Take 1,000 mg by mouth 2 (two) times daily. Coral Calcium 1000mg    OVER THE COUNTER MEDICATION Take 400 mg by mouth 2 (two) times daily. Magnesium Carbonate 400mg    pantoprazole 40 MG tablet Commonly known as:  PROTONIX Take 1 tablet (40 mg total) by mouth daily.   polyethylene glycol packet Commonly known as:  MIRALAX / GLYCOLAX Take 17 g by mouth 2 (two) times daily.   potassium chloride SA 20 MEQ tablet Commonly known as:  K-DUR,KLOR-CON Take 1 tablet (20 mEq total) by mouth 2 (two) times daily.   pravastatin 40 MG tablet Commonly known as:  PRAVACHOL Take 1 tablet (40 mg total) by mouth daily.   silodosin 8 MG Caps capsule Commonly known as:  RAPAFLO Take 8 mg by mouth every other day.   tobramycin 0.3 % ophthalmic ointment Commonly known as:  [place] into  both eyes 2 (two) times daily. Place a 1/2 inch ribbon of ointment into the lower eyelid.   Vitamin D3 2000 units Tabs Take 6,000 Units by mouth daily.       Diagnostic Studies: No results found.  Discharge Instructions    Call MD / Call 911    Complete by:  As directed    If you experience chest pain or shortness of breath, CALL 911 and be transported to the hospital emergency room.  If you develope a fever above 101 F, pus (white drainage) or increased drainage or redness at the wound, or calf pain, call your surgeon's office.   Constipation Prevention    Complete by:  As directed    Drink plenty of fluids.  Prune juice may be helpful.  You may use a stool softener, such as Colace (over the counter) 100 mg twice a day.  Use MiraLax (over the counter) for constipation as needed.   Diet - low sodium heart healthy    Complete by:  As directed    Diet Carb Modified    Complete by:  As directed    Discharge instructions    Complete by:  As directed    Call if there is increasing drainage, fever greater than 101.5, severe head aches, and worsening nausea or light  sensitivity. If shortness of breath, bloody cough or chest tightness or pain go to an emergency room. No lifting greater than 10 lbs. Avoid bending, stooping and twisting. Use brace when sitting and out of bed even to go to bathroom. Walk in house for first 2 weeks then may start to get out slowly increasing distances up to one quarter mile by 4-6 weeks post op. After 5 days may shower and change dressing following bathing with shower.When bathing remove the brace shower and replace brace before getting out of the shower. If drainage, keep dry dressing and do not bathe the incision, use an moisture impervious dressing. Please call and return for scheduled follow up appointment 2 weeks from the time of surgery.   Driving restrictions    Complete by:  As directed    No driving for 8 weeks   Increase activity slowly as tolerated    Complete by:  As directed    Lifting restrictions    Complete by:  As directed    No lifting for 12 weeks      Follow-up Information    NITKA,Chelli Yerkes E, MD Follow up in 2 week(s).   Specialty:  Orthopedic Surgery Why:  For wound re-check Contact information: [place] [address] [phone]        [place] .   Why:  Someone from [facility] will contact you to arrange start date and time for therapy. Contact information: [place] [address] [phone]        Alesia Richards, MD. Schedule an appointment as soon as possible for a visit in 7 day(s).   Specialty:  Internal Medicine Why:  Cough, negative CXR, COPD, Constipation Contact information: [address] [place]  [address] [phone]           Discharge Plan:  discharge to home Disposition:     Signed: Lanae Crumbly 01/28/2016, 10:51 AM

## 2016-02-05 DIAGNOSIS — M4726 Other spondylosis with radiculopathy, lumbar region: Secondary | ICD-10-CM | POA: Diagnosis not present

## 2016-02-05 DIAGNOSIS — G894 Chronic pain syndrome: Secondary | ICD-10-CM | POA: Diagnosis not present

## 2016-02-05 DIAGNOSIS — Z79891 Long term (current) use of opiate analgesic: Secondary | ICD-10-CM | POA: Diagnosis not present

## 2016-02-05 DIAGNOSIS — M961 Postlaminectomy syndrome, not elsewhere classified: Secondary | ICD-10-CM | POA: Diagnosis not present

## 2016-02-11 ENCOUNTER — Other Ambulatory Visit: Payer: Self-pay | Admitting: Internal Medicine

## 2016-02-11 ENCOUNTER — Telehealth: Payer: Self-pay | Admitting: *Deleted

## 2016-02-11 NOTE — Telephone Encounter (Signed)
Patient called and asked if the Carafate could be causing her arms and hips to be sore.  Per Dr Melford Aase, Carafate would not cause this.  Patient is aware,

## 2016-02-25 ENCOUNTER — Inpatient Hospital Stay (INDEPENDENT_AMBULATORY_CARE_PROVIDER_SITE_OTHER): Payer: PPO | Admitting: Specialist

## 2016-02-25 DIAGNOSIS — M48062 Spinal stenosis, lumbar region with neurogenic claudication: Secondary | ICD-10-CM

## 2016-03-02 ENCOUNTER — Telehealth (INDEPENDENT_AMBULATORY_CARE_PROVIDER_SITE_OTHER): Payer: Self-pay | Admitting: *Deleted

## 2016-03-02 NOTE — Telephone Encounter (Signed)
Pt. Called and wants to know if it is ok to use treadmill. Call back number is [phone]

## 2016-03-02 NOTE — Telephone Encounter (Signed)
See msg below.Madaline Brilliant to use treadmill?

## 2016-03-03 DIAGNOSIS — M961 Postlaminectomy syndrome, not elsewhere classified: Secondary | ICD-10-CM | POA: Diagnosis not present

## 2016-03-03 DIAGNOSIS — G894 Chronic pain syndrome: Secondary | ICD-10-CM | POA: Diagnosis not present

## 2016-03-03 DIAGNOSIS — M4726 Other spondylosis with radiculopathy, lumbar region: Secondary | ICD-10-CM | POA: Diagnosis not present

## 2016-03-03 DIAGNOSIS — Z79891 Long term (current) use of opiate analgesic: Secondary | ICD-10-CM | POA: Diagnosis not present

## 2016-03-03 NOTE — Telephone Encounter (Signed)
Walking on a treadmill at a low speed is okay.

## 2016-03-04 ENCOUNTER — Other Ambulatory Visit: Payer: Self-pay | Admitting: Internal Medicine

## 2016-03-04 NOTE — Telephone Encounter (Signed)
Called patient multiple times, keep getting busy signal. Will advise patient when she returns call  Thanks. Nira Conn

## 2016-03-09 DIAGNOSIS — R110 Nausea: Secondary | ICD-10-CM

## 2016-03-09 HISTORY — DX: Nausea: R11.0

## 2016-03-27 ENCOUNTER — Emergency Department (HOSPITAL_COMMUNITY): Payer: PPO

## 2016-03-27 ENCOUNTER — Encounter (HOSPITAL_COMMUNITY): Payer: Self-pay

## 2016-03-27 ENCOUNTER — Emergency Department (HOSPITAL_COMMUNITY)
Admission: EM | Admit: 2016-03-27 | Discharge: 2016-03-27 | Disposition: A | Discharge status: Home / Self Care | Payer: PPO | Attending: Emergency Medicine | Admitting: Emergency Medicine

## 2016-03-27 DIAGNOSIS — I10 Essential (primary) hypertension: Secondary | ICD-10-CM | POA: Insufficient documentation

## 2016-03-27 DIAGNOSIS — Z955 Presence of coronary angioplasty implant and graft: Secondary | ICD-10-CM | POA: Diagnosis not present

## 2016-03-27 DIAGNOSIS — R109 Unspecified abdominal pain: Secondary | ICD-10-CM | POA: Diagnosis not present

## 2016-03-27 DIAGNOSIS — R1013 Epigastric pain: Secondary | ICD-10-CM | POA: Diagnosis not present

## 2016-03-27 DIAGNOSIS — Z8673 Personal history of transient ischemic attack (TIA), and cerebral infarction without residual deficits: Secondary | ICD-10-CM | POA: Diagnosis not present

## 2016-03-27 DIAGNOSIS — Z7984 Long term (current) use of oral hypoglycemic drugs: Secondary | ICD-10-CM | POA: Insufficient documentation

## 2016-03-27 DIAGNOSIS — I2510 Atherosclerotic heart disease of native coronary artery without angina pectoris: Secondary | ICD-10-CM | POA: Diagnosis not present

## 2016-03-27 DIAGNOSIS — Z79899 Other long term (current) drug therapy: Secondary | ICD-10-CM | POA: Diagnosis not present

## 2016-03-27 DIAGNOSIS — R112 Nausea with vomiting, unspecified: Secondary | ICD-10-CM | POA: Insufficient documentation

## 2016-03-27 DIAGNOSIS — J45909 Unspecified asthma, uncomplicated: Secondary | ICD-10-CM | POA: Diagnosis not present

## 2016-03-27 DIAGNOSIS — R102 Pelvic and perineal pain: Secondary | ICD-10-CM | POA: Diagnosis not present

## 2016-03-27 LAB — CBC
HEMATOCRIT: 45.1 % (ref 36.0–46.0)
HEMOGLOBIN: 14.9 g/dL (ref 12.0–15.0)
MCH: 29 pg (ref 26.0–34.0)
MCHC: 33 g/dL (ref 30.0–36.0)
MCV: 87.7 fL (ref 78.0–100.0)
Platelets: 371 10*3/uL (ref 150–400)
RBC: 5.14 MIL/uL — AB (ref 3.87–5.11)
RDW: 15.6 % — AB (ref 11.5–15.5)
WBC: 7.5 10*3/uL (ref 4.0–10.5)

## 2016-03-27 LAB — COMPREHENSIVE METABOLIC PANEL
ALT: 17 U/L (ref 14–54)
ANION GAP: 12 (ref 5–15)
AST: 32 U/L (ref 15–41)
Albumin: 4.4 g/dL (ref 3.5–5.0)
Alkaline Phosphatase: 68 U/L (ref 38–126)
BILIRUBIN TOTAL: 0.8 mg/dL (ref 0.3–1.2)
BUN: 12 mg/dL (ref 6–20)
CHLORIDE: 104 mmol/L (ref 101–111)
CO2: 22 mmol/L (ref 22–32)
Calcium: 10.2 mg/dL (ref 8.9–10.3)
Creatinine, Ser: 1 mg/dL (ref 0.44–1.00)
GFR, EST NON AFRICAN AMERICAN: 53 mL/min — AB (ref >60)
Glucose, Bld: 158 mg/dL — ABNORMAL HIGH (ref 65–99)
Potassium: 5 mmol/L (ref 3.5–5.1)
Sodium: 138 mmol/L (ref 135–145)
TOTAL PROTEIN: 7.8 g/dL (ref 6.5–8.1)

## 2016-03-27 LAB — LIPASE, BLOOD: LIPASE: 17 U/L (ref 11–51)

## 2016-03-27 MED ORDER — PROMETHAZINE HCL 25 MG PO TABS: 25.0000 mg | ORAL_TABLET | Freq: Four times a day (QID) | ORAL | 0 refills | Status: DC | PRN

## 2016-03-27 MED ORDER — HYDROMORPHONE HCL 2 MG/ML IJ SOLN
1.0000 mg | Freq: Once | INTRAMUSCULAR | Status: AC
  Administered 2016-03-27: 1 mg via INTRAVENOUS
  Filled 2016-03-27: qty 1

## 2016-03-27 MED ORDER — SODIUM CHLORIDE 0.9 % IV BOLUS (SEPSIS)
250.0000 mL | Freq: Once | INTRAVENOUS | Status: AC
  Administered 2016-03-27: 250 mL via INTRAVENOUS

## 2016-03-27 MED ORDER — PROMETHAZINE HCL 25 MG/ML IJ SOLN
12.5000 mg | Freq: Once | INTRAMUSCULAR | Status: AC
  Administered 2016-03-27: 12.5 mg via INTRAVENOUS
  Filled 2016-03-27: qty 1

## 2016-03-27 MED ORDER — SODIUM CHLORIDE 0.9 % IV SOLN
INTRAVENOUS | Status: DC
  Administered 2016-03-27: 75 mL/h via INTRAVENOUS

## 2016-03-27 MED ORDER — IOPAMIDOL (ISOVUE-300) INJECTION 61%
INTRAVENOUS | Status: AC
  Administered 2016-03-27: 100 mL
  Filled 2016-03-27: qty 100

## 2016-03-27 MED ORDER — ONDANSETRON HCL 4 MG/2ML IJ SOLN
4.0000 mg | Freq: Once | INTRAMUSCULAR | Status: AC
  Administered 2016-03-27: 4 mg via INTRAVENOUS
  Filled 2016-03-27: qty 2

## 2016-03-27 MED ORDER — ONDANSETRON HCL 4 MG/2ML IJ SOLN
4.0000 mg | Freq: Once | INTRAMUSCULAR | Status: DC
  Filled 2016-03-27: qty 2

## 2016-03-27 NOTE — ED Notes (Signed)
Called CT.  They stated they pt is 2nd in line.

## 2016-03-27 NOTE — ED Triage Notes (Signed)
Patient was awakened 0430 with dry heaves and abdominal pain. No vomiting, no diarrhea. States that she ate out last pm and thinks related to same. Pale on arrival

## 2016-03-27 NOTE — ED Provider Notes (Signed)
[facility] DEPT Provider Note   CSN: [id number] Arrival date & time: 03/27/16  [id number]     History   Chief Complaint Chief Complaint  Patient presents with  . Abdominal Pain  . Emesis    HPI [name] [name] is a 80 y.o. female.  Patient with acute onset of epigastric abdominal pain with recurrent and persistent nausea and vomiting at 4 this morning. No diarrhea. Pain came on suddenly. Patient felt fine yesterday. Patient does have a history of chronic pain as well as chronic nausea. Patient is followed by pain management. Patient states the epigastric abdominal pain as severe as 10 out of 10.      Past Medical History:  Diagnosis Date  . Anxiety    takes Xanax daily as needed  . Asthma    Albuterol daily as needed  . CAD (coronary artery disease)    LHC 2003 with 40% pLAD, 30% mLAD, 100% D1 (moderate vessel), 100%  D2 (small vessel), 95% mid small OM2.  Pt had PTCA to D1;  D2 and OM2 small vessels and tx medically;  Last Myoview (2012): anterior infarct seen with scar, no ischemia. EF normal. Pt managed medically;  Lexiscan Myoview (12/14):  Low risk; ant scar with very mild peri-infarct ischemia; EF 55% with ant AK   . Chronic back pain    HNP, DDD, spinal stenosis, vertebral compression fractures.   . Chronic nausea    takes Zofran daily as needed.  normal gastric emptying study 03/2013  . Constipation    felt to be functional. takes Senokot and Miralax daily.  treated with Linzess in past.   . DIVERTICULOSIS, COLON 08/28/2008   Qualifier: Diagnosis of  By: [name], [name], [name]    . Elevated LFTs 2015   probably med induced DILI, from Augmentin vs Pravastatin  . GERD (gastroesophageal reflux disease)    hx of esophageal stricture   . Hiatal hernia    Paraesophageal hernias. s/p fundoplications in 0000000 and 04/2013  . History of bronchitis several of yrs ago  . History of colon polyps 03/2009, 03/2011   adenomatous colon polyps, no high grade dysplasia.   [name] History  of vertigo    no meds  . Hyperlipidemia    takes Pravastatin daily  . Hypertension    takes Amlodipine,Metoprolol,and Losartan daily  . Osteoporosis   . PONV (postoperative nausea and vomiting)    yrs ago  . Slow urinary stream    takes Rapaflo daily  . TIA (transient ischemic attack) 1995   no residual effects noted    Patient Active Problem List   Diagnosis Date Noted  . Uncontrollable vomiting   . Dysphagia   . Nausea and vomiting 12/23/2015  . Ileus ([place])   . Acute right lumbar radiculopathy 12/07/2015    Class: Chronic  . Other forms of scoliosis, lumbar region 12/07/2015    Class: Chronic  . Spinal stenosis of lumbar region with neurogenic claudication 12/07/2015  . HNP (herniated nucleus pulposus), lumbar 05/29/2015    Class: Chronic  . Spinal stenosis, lumbar region, with neurogenic claudication 05/29/2015  . DDD (degenerative disc disease), lumbar 04/21/2015  . Encounter for Medicare annual wellness exam 02/27/2015  . Mixed hyperlipidemia 11/24/2014  . GERD  11/24/2014  . Depression, major, in partial remission ([place]) 09/26/2014  . Chronic pain syndrome 09/26/2014  . Coronary artery disease due to lipid rich plaque   . Osteoporosis 02/10/2014  . Prediabetes 08/14/2013  . Vitamin D deficiency 08/14/2013  . Medication management  08/14/2013  . Constipation, chronic 04/03/2013  . MGUS (monoclonal gammopathy of unknown significance) 03/05/2013  . Herniated lumbar intervertebral disc 06/25/2012    Class: Acute  . Vertebral compression fracture ([place]) 06/23/2012  . ESOPHAGEAL STRICTURE 08/28/2008  . Asthma 07/29/2008  . Incarcerated recurrent paraesophageal hiatal hernia s/p lap redo repair [id number] 01/31/2008  . Essential hypertension 07/25/2007    Past Surgical History:  Procedure Laterality Date  . APPENDECTOMY     patient unsure of date  . BACK SURGERY    . BLADDER REPAIR    . CHOLECYSTECTOMY     Patient unsure of date.  . CORONARY ANGIOPLASTY  11/16/2001    1 stent  . ESOPHAGEAL MANOMETRY N/A 03/25/2013   Procedure: ESOPHAGEAL MANOMETRY (EM);  Surgeon: Inda Castle, MD;  Location: [facility] ENDOSCOPY;  Service: Endoscopy;  Laterality: N/A;  . ESOPHAGOGASTRODUODENOSCOPY N/A 03/15/2013   Procedure: ESOPHAGOGASTRODUODENOSCOPY (EGD);  Surgeon: Jerene Bears, MD;  Location: [facility] ENDOSCOPY;  Service: Endoscopy;  Laterality: N/A;  . ESOPHAGOGASTRODUODENOSCOPY N/A 12/25/2015   Procedure: ESOPHAGOGASTRODUODENOSCOPY (EGD);  Surgeon: Doran Stabler, MD;  Location: [facility] ENDOSCOPY;  Service: Endoscopy;  Laterality: N/A;  . EYE SURGERY  11/2000   bilateral cataracts with lens implant  . FIXATION KYPHOPLASTY LUMBAR SPINE  X 2   "L3-4"  . HEMORRHOID SURGERY    . HIATAL HERNIA REPAIR  06/03/2011   Procedure: LAPAROSCOPIC REPAIR OF HIATAL HERNIA;  Surgeon: Adin Hector, MD;  Location: [facility] ORS;  Service: General;  Laterality: N/A;  . HIATAL HERNIA REPAIR N/A 04/24/2013   Procedure: LAPAROSCOPIC  REPAIR RECURRENT PARASOPHAGEAL HIATAL HERNIA WITH FUNDOPLICATION;  Surgeon: Adin Hector, MD;  Location: [facility] ORS;  Service: General;  Laterality: N/A;  . INSERTION OF MESH N/A 04/24/2013   Procedure: INSERTION OF MESH;  Surgeon: Adin Hector, MD;  Location: [facility] ORS;  Service: General;  Laterality: N/A;  . LAPAROSCOPIC LYSIS OF ADHESIONS N/A 04/24/2013   Procedure: LAPAROSCOPIC LYSIS OF ADHESIONS;  Surgeon: Adin Hector, MD;  Location: [facility] ORS;  Service: General;  Laterality: N/A;  . LAPAROSCOPIC NISSEN FUNDOPLICATION  [id number]   Procedure: LAPAROSCOPIC NISSEN FUNDOPLICATION;  Surgeon: Adin Hector, MD;  Location: [facility] ORS;  Service: General;  Laterality: N/A;  . LUMBAR FUSION  12/07/2015   Right L2-3 facetectomy, posterolateral fusion L2-3 with pedicle screws, rods, local bone graft, Vivigen cancellous chips. Fusion extended to the L1 level with pedicle screws and rods  . LUMBAR LAMINECTOMY/DECOMPRESSION MICRODISCECTOMY Right 06/26/2012   Procedure: LUMBAR  LAMINECTOMY/DECOMPRESSION MICRODISCECTOMY;  Surgeon: Jessy Oto, MD;  Location: [facility];  Service: Orthopedics;  Laterality: Right;  RIGHT L4-5 MICRODISCECTOMY  . LUMBAR LAMINECTOMY/DECOMPRESSION MICRODISCECTOMY N/A 05/29/2015   Procedure: RIGHT L2-3 MICRODISCECTOMY;  Surgeon: Jessy Oto, MD;  Location: [facility];  Service: Orthopedics;  Laterality: N/A;  . TOTAL ABDOMINAL HYSTERECTOMY  1975   partial    OB History    No data available       Home Medications    Prior to Admission medications   Medication Sig Start Date End Date Taking? Authorizing Provider  acetaminophen (TYLENOL) 325 MG tablet Take 2 tablets (650 mg total) by mouth every 6 (six) hours as needed for mild pain (or Fever >/= 101). 12/26/15   Courage Denton Brick, MD  albuterol (PROVENTIL HFA;VENTOLIN HFA) 108 (90 BASE) MCG/ACT inhaler Inhale 1-2 puffs into the lungs every 6 (six) hours as needed for wheezing or shortness of breath.    Historical Provider, MD  ALPRAZolam Duanne Moron) 0.5 MG  tablet TAKE ONE-HALF TO ONE TABLET BY MOUTH TWO TO THREE TIMES DAILY FOR NERVES 01/14/16   Vicie Mutters, PA-C  amLODipine (NORVASC) 10 MG tablet Take 1 tablet (10 mg total) by mouth at bedtime. Patient taking differently: Take 10 mg by mouth every morning.  09/20/14   Reyne Dumas, MD  Cholecalciferol (VITAMIN D3) 2000 UNITS TABS Take 6,000 Units by mouth daily.     Historical Provider, MD  fluconazole (DIFLUCAN) 150 MG tablet Take 1 tablet (150 mg total) by mouth daily. 12/26/15   Roxan Hockey, MD  gabapentin (NEURONTIN) 300 MG capsule Take 1 capsule (300 mg total) by mouth 3 (three) times daily. 01/21/16   [name], MD  ipratropium-albuterol (DUONEB) 0.5-2.5 (3) MG/3ML SOLN USE ONE VIAL IN NEBULIZER 4 TIMES DAILY Patient taking differently: Inhale 3 mLs into the lungs every 6 (six) hours as needed. For shortness of breath 02/26/14   [name], MD  losartan (COZAAR) 100 MG tablet TAKE ONE TABLET BY MOUTH ONCE DAILY ( DISCONTINUE BENICAR)  09/30/15   Vicie Mutters, PA-C  metFORMIN (GLUCOPHAGE-XR) 500 MG 24 hr tablet TAKE ONE TO TWO TABLETS BY MOUTH TWICE DAILY WITH MEALS FOR DIABETES 01/12/16   [name], MD  metoprolol (LOPRESSOR) 50 MG tablet TAKE ONE TABLET BY MOUTH TWICE DAILY FOR BLOOD PRESSURE 02/11/16   Vicie Mutters, PA-C  mometasone-formoterol (DULERA) 200-5 MCG/ACT AERO Inhale 2 puffs into the lungs 2 (two) times daily. Patient taking differently: Inhale 2 puffs into the lungs 2 (two) times daily as needed for wheezing.  07/03/13   Kelby Aline, PA-C  nitroGLYCERIN (NITROSTAT) 0.4 MG SL tablet Place 1 tablet (0.4 mg total) under the tongue every 5 (five) minutes as needed for chest pain. 09/04/14   Larey Dresser, MD  ondansetron (ZOFRAN ODT) 4 MG disintegrating tablet Take 1 tablet (4 mg total) by mouth every 8 (eight) hours as needed for nausea or vomiting. 12/26/15   Roxan Hockey, MD  orphenadrine (NORFLEX) 100 MG tablet Take 1 tablet (100 mg total) by mouth 2 (two) times daily as needed for muscle spasms. 05/30/15   Jessy Oto, MD  OVER THE COUNTER MEDICATION Take 1,000 mg by mouth 2 (two) times daily. [place] Calcium 1000mg     Historical Provider, MD  OVER THE COUNTER MEDICATION Take 400 mg by mouth 2 (two) times daily. Magnesium Carbonate 400mg     Historical Provider, MD  oxyCODONE (OXY IR/ROXICODONE) 5 MG immediate release tablet Take 1 tablet (5 mg total) by mouth every 4 (four) hours as needed for severe pain or breakthrough pain. 12/26/15   Roxan Hockey, MD  oxyCODONE (OXYCONTIN) 10 mg 12 hr tablet Take 1 tablet (10 mg total) by mouth every 12 (twelve) hours. 12/26/15   Courage Denton Brick, MD  pantoprazole (PROTONIX) 40 MG tablet Take 1 tablet (40 mg total) by mouth daily. 03/18/15   [name], MD  polyethylene glycol [facility] / Floria Raveling) packet Take 17 g by mouth 2 (two) times daily. 12/11/15   Jessy Oto, MD  potassium chloride SA (K-DUR,KLOR-CON) 20 MEQ tablet Take 1 tablet (20 mEq total) by mouth 2 (two)  times daily. 11/25/14   [name], MD  pravastatin (PRAVACHOL) 40 MG tablet Take 1 tablet (40 mg total) by mouth daily. 07/09/15   [name], MD  prochlorperazine (COMPAZINE) 5 MG tablet Take 1 capsule 3 x day before meals only if needed for Nausea 12/26/15   Roxan Hockey, MD  promethazine (PHENERGAN) 25 MG tablet Take 1 tablet (25 mg  total) by mouth every 6 (six) hours as needed for nausea or vomiting. 03/27/16   Fredia Sorrow, MD  senna-docusate (SENOKOT-S) 8.6-50 MG tablet Take 2 tablets by mouth 2 (two) times daily. 12/26/15   Roxan Hockey, MD  silodosin (RAPAFLO) 8 MG CAPS capsule Take 8 mg by mouth every other day.     Historical Provider, MD  sucralfate (CARAFATE) 1 g tablet TAKE ONE TABLET BY MOUTH 4 TIMES DAILY BEFORE MEALS AND AT BEDTIME FOR HEARTBURN 01/06/16   [name], MD  tobramycin (TOBREX) 0.3 % ophthalmic ointment Place into both eyes 2 (two) times daily. Place a 1/2 inch ribbon of ointment into the lower eyelid. 11/25/15   Starlyn Skeans, PA-C    Family History Family History  Problem Relation Age of Onset  . Colon cancer Mother 40  . Heart disease Father 76    heart attack    Social History Social History  Substance Use Topics  . Smoking status: Never Smoker  . Smokeless tobacco: Never Used  . Alcohol use No     Allergies   Ace inhibitors; Aspartame; Atorvastatin; Biaxin [clarithromycin]; Daliresp [roflumilast]; Erythromycin; Hydrocodone; and Levofloxacin   Review of Systems Review of Systems  Constitutional: Negative for fever.  HENT: Negative for congestion.   Eyes: Negative for redness.  Respiratory: Negative for shortness of breath.   Cardiovascular: Negative for chest pain.  Gastrointestinal: Positive for abdominal pain, nausea and vomiting.  Genitourinary: Negative for dysuria.  Musculoskeletal: Negative for back pain.  Neurological: Negative for headaches.  Hematological: Does not bruise/bleed easily.  Psychiatric/Behavioral:  Negative for confusion.     Physical Exam Updated Vital Signs BP 150/76   Pulse 85   Temp 97.5 F (36.4 C) (Oral)   Resp 14   SpO2 98%   Physical Exam  Constitutional: She is oriented to person, place, and time. She appears well-developed and well-nourished. She appears distressed.  HENT:  Head: Normocephalic and atraumatic.  Mouth/Throat: Oropharynx is clear and moist.  Eyes: EOM are normal. Pupils are equal, round, and reactive to light.  Neck: Normal range of motion. Neck supple.  Cardiovascular: Normal rate and regular rhythm.   Pulmonary/Chest: Effort normal and breath sounds normal. No respiratory distress.  Abdominal: Soft. Bowel sounds are normal. There is no tenderness.  Musculoskeletal: Normal range of motion. She exhibits no edema.  Neurological: She is alert and oriented to person, place, and time. No cranial nerve deficit or sensory deficit. She exhibits normal muscle tone. Coordination normal.  Skin: Skin is warm. No rash noted. There is pallor.  Nursing note and vitals reviewed.    ED Treatments / Results  Labs (all labs ordered are listed, but only abnormal results are displayed) Labs Reviewed  COMPREHENSIVE METABOLIC PANEL - Abnormal; Notable for the following:       Result Value   Glucose, Bld 158 (*)    GFR calc non Af Amer 53 (*)    All other components within normal limits  CBC - Abnormal; Notable for the following:    RBC 5.14 (*)    RDW 15.6 (*)    All other components within normal limits  LIPASE, BLOOD  URINALYSIS, ROUTINE W REFLEX MICROSCOPIC (NOT AT [facility])    EKG  EKG Interpretation  Date/Time:  Sunday March 27 2016 10:48:13 EST Ventricular Rate:  81 PR Interval:    QRS Duration: 76 QT Interval:  389 QTC Calculation: 452 R Axis:   22 Text Interpretation:  Sinus rhythm Low voltage, precordial leads Probable  anteroseptal infarct, old Borderline T abnormalities, inferior leads T-wave inversion in Increased R/S ratio in V1, consider  early transition or posterior infarct Otherwise no significant change Confirmed by Rogene Houston  MD, Danylle Ouk [id number]) on 03/27/2016 11:02:12 AM       Radiology Ct Abdomen Pelvis W Contrast  Result Date: 03/27/2016 CLINICAL DATA:  80 year old female with abdominal and pelvic pain with nausea and vomiting today. EXAM: CT ABDOMEN AND PELVIS WITH CONTRAST TECHNIQUE: Multidetector CT imaging of the abdomen and pelvis was performed using the standard protocol following bolus administration of intravenous contrast. CONTRAST:  151mL ISOVUE-300 IOPAMIDOL (ISOVUE-300) INJECTION 61% COMPARISON:  12/23/2015 CT FINDINGS: Lower chest: No acute abnormality.  Cardiomegaly again identified. Hepatobiliary: No significant hepatic abnormality. Patient is status post cholecystectomy. Fullness of the CBD which measures 12 mm is unchanged from 2015. Pancreas: No significant abnormality. Spleen: Unremarkable Adrenals/Urinary Tract: The kidneys, adrenal glands and bladder are unremarkable except for probable tiny renal cysts. Stomach/Bowel: Hiatal hernia repair changes again noted. There is no evidence of bowel obstruction or definite bowel wall thickening. Colonic diverticulosis noted without evidence of diverticulitis. Vascular/Lymphatic: Abdominal aortic atherosclerotic calcifications noted without aneurysm. Reproductive: Status post hysterectomy. No adnexal masses. Other:  No ascites, abscess or pneumoperitoneum. Musculoskeletal: No acute or suspicious abnormalities. L1 compression fracture, L2-L4 compression fractures and vertebral augmentation changes and posterior fusion from L1-2 L3 again noted. IMPRESSION: No evidence of acute abnormality. Cardiomegaly, colonic diverticulosis and hiatal hernia repair changes. Abdominal aortic atherosclerosis. Lumbar spine compression fractures and vertebral augmentation changes again noted. Electronically Signed   By: Margarette Canada M.D.   On: 03/27/2016 14:52   Dg Abd Acute W/chest  Result  Date: 03/27/2016 CLINICAL DATA:  Abdominal pain and vomiting EXAM: DG ABDOMEN ACUTE W/ 1V CHEST COMPARISON:  Body CT 12/23/2015 FINDINGS: Normal heart size and mediastinal contours. No acute infiltrate or edema. No effusion or pneumothorax. No acute osseous findings. Nonobstructive bowel gas pattern. No concerning mass effect or gas collection. No evidence of renal calculus. Lumbar spine fixation and multilevel vertebroplasty. No acute osseous finding. Cholecystectomy clips. IMPRESSION: No evidence of acute disease in the chest or abdomen. Electronically Signed   By: Monte Fantasia M.D.   On: 03/27/2016 12:06    Procedures Procedures (including critical care time)  Medications Ordered in ED Medications  0.9 %  sodium chloride infusion (75 mL/hr Intravenous New Bag/Given 03/27/16 1123)  ondansetron (ZOFRAN) injection 4 mg (4 mg Intravenous Not Given 03/27/16 1315)  sodium chloride 0.9 % bolus 250 mL (0 mLs Intravenous Stopped 03/27/16 1327)  ondansetron (ZOFRAN) injection 4 mg (4 mg Intravenous Given 03/27/16 1123)  HYDROmorphone (DILAUDID) injection 1 mg (1 mg Intravenous Given 03/27/16 1124)  HYDROmorphone (DILAUDID) injection 1 mg (1 mg Intravenous Given 03/27/16 1325)  promethazine (PHENERGAN) injection 12.5 mg (12.5 mg Intravenous Given 03/27/16 1328)  iopamidol (ISOVUE-300) 61 % injection (100 mLs  Contrast Given 03/27/16 1421)     Initial Impression / Assessment and Plan / ED Course  I have reviewed the triage vital signs and the nursing notes.  Pertinent labs & imaging results that were available during my care of the patient were reviewed by me and considered in my medical decision making (see chart for details).  Clinical Course     Patient with acute onset this morning around 4 in the morning of epigastric abdominal pain and persistent vomiting. Pain was severe. 10 out of 10. Extensive workup to include CT abdomen and pelvis without any acute findings. Labs without any acute  abnormalities. Patient improved with antinausea medicine and pain medicine. Patient has a history of chronic pain is followed by pain management. Patient has Percocet at home. Since Phenergan seems to help her a lot will be given a prescription for Phenergan.  Final Clinical Impressions(s) / ED Diagnoses   Final diagnoses:  Epigastric pain  Non-intractable vomiting with nausea, unspecified vomiting type    New Prescriptions New Prescriptions   PROMETHAZINE (PHENERGAN) 25 MG TABLET    Take 1 tablet (25 mg total) by mouth every 6 (six) hours as needed for nausea or vomiting.     Fredia Sorrow, MD 03/27/16 1511

## 2016-03-27 NOTE — Discharge Instructions (Signed)
Follow-up with pain management doctor on Wednesday as scheduled. Take the Phenergan on a regular basis. Take your current pain medications have at home. Return for any new or worse symptoms. Today's workup to include CT of the abdomen and pelvis without any acute findings. Labs were normal as well.

## 2016-03-28 ENCOUNTER — Encounter (HOSPITAL_COMMUNITY): Payer: Self-pay | Admitting: Emergency Medicine

## 2016-03-28 ENCOUNTER — Observation Stay (HOSPITAL_COMMUNITY)
Admission: EM | Admit: 2016-03-28 | Discharge: 2016-03-29 | Disposition: A | Discharge status: Home / Self Care | Payer: PPO | Attending: Internal Medicine | Admitting: Internal Medicine

## 2016-03-28 DIAGNOSIS — G894 Chronic pain syndrome: Secondary | ICD-10-CM | POA: Diagnosis present

## 2016-03-28 DIAGNOSIS — R112 Nausea with vomiting, unspecified: Secondary | ICD-10-CM | POA: Diagnosis not present

## 2016-03-28 DIAGNOSIS — M810 Age-related osteoporosis without current pathological fracture: Secondary | ICD-10-CM | POA: Diagnosis not present

## 2016-03-28 DIAGNOSIS — Z8673 Personal history of transient ischemic attack (TIA), and cerebral infarction without residual deficits: Secondary | ICD-10-CM | POA: Diagnosis not present

## 2016-03-28 DIAGNOSIS — R1084 Generalized abdominal pain: Secondary | ICD-10-CM

## 2016-03-28 DIAGNOSIS — F419 Anxiety disorder, unspecified: Secondary | ICD-10-CM | POA: Insufficient documentation

## 2016-03-28 DIAGNOSIS — D72829 Elevated white blood cell count, unspecified: Secondary | ICD-10-CM | POA: Insufficient documentation

## 2016-03-28 DIAGNOSIS — D472 Monoclonal gammopathy: Secondary | ICD-10-CM | POA: Insufficient documentation

## 2016-03-28 DIAGNOSIS — Z79891 Long term (current) use of opiate analgesic: Secondary | ICD-10-CM | POA: Insufficient documentation

## 2016-03-28 DIAGNOSIS — Z9049 Acquired absence of other specified parts of digestive tract: Secondary | ICD-10-CM | POA: Diagnosis not present

## 2016-03-28 DIAGNOSIS — R39198 Other difficulties with micturition: Secondary | ICD-10-CM | POA: Insufficient documentation

## 2016-03-28 DIAGNOSIS — K5909 Other constipation: Secondary | ICD-10-CM | POA: Diagnosis not present

## 2016-03-28 DIAGNOSIS — I10 Essential (primary) hypertension: Secondary | ICD-10-CM | POA: Diagnosis not present

## 2016-03-28 DIAGNOSIS — E119 Type 2 diabetes mellitus without complications: Secondary | ICD-10-CM | POA: Diagnosis not present

## 2016-03-28 DIAGNOSIS — N179 Acute kidney failure, unspecified: Secondary | ICD-10-CM | POA: Diagnosis not present

## 2016-03-28 DIAGNOSIS — E782 Mixed hyperlipidemia: Secondary | ICD-10-CM | POA: Insufficient documentation

## 2016-03-28 DIAGNOSIS — Z79899 Other long term (current) drug therapy: Secondary | ICD-10-CM | POA: Insufficient documentation

## 2016-03-28 DIAGNOSIS — K529 Noninfective gastroenteritis and colitis, unspecified: Secondary | ICD-10-CM | POA: Diagnosis not present

## 2016-03-28 DIAGNOSIS — M5116 Intervertebral disc disorders with radiculopathy, lumbar region: Secondary | ICD-10-CM | POA: Diagnosis not present

## 2016-03-28 DIAGNOSIS — I2510 Atherosclerotic heart disease of native coronary artery without angina pectoris: Secondary | ICD-10-CM | POA: Insufficient documentation

## 2016-03-28 DIAGNOSIS — Z981 Arthrodesis status: Secondary | ICD-10-CM | POA: Insufficient documentation

## 2016-03-28 DIAGNOSIS — Z955 Presence of coronary angioplasty implant and graft: Secondary | ICD-10-CM | POA: Insufficient documentation

## 2016-03-28 DIAGNOSIS — K219 Gastro-esophageal reflux disease without esophagitis: Secondary | ICD-10-CM | POA: Diagnosis not present

## 2016-03-28 DIAGNOSIS — J45909 Unspecified asthma, uncomplicated: Secondary | ICD-10-CM | POA: Diagnosis not present

## 2016-03-28 HISTORY — DX: Nausea: R11.0

## 2016-03-28 LAB — CBC WITH DIFFERENTIAL/PLATELET
BASOS PCT: 0 %
Basophils Absolute: 0 10*3/uL (ref 0.0–0.1)
Eosinophils Absolute: 0 10*3/uL (ref 0.0–0.7)
Eosinophils Relative: 0 %
HEMATOCRIT: 43.4 % (ref 36.0–46.0)
Hemoglobin: 14.4 g/dL (ref 12.0–15.0)
LYMPHS PCT: 6 %
Lymphs Abs: 0.7 10*3/uL (ref 0.7–4.0)
MCH: 29.2 pg (ref 26.0–34.0)
MCHC: 33.2 g/dL (ref 30.0–36.0)
MCV: 88 fL (ref 78.0–100.0)
MONO ABS: 0.6 10*3/uL (ref 0.1–1.0)
Monocytes Relative: 4 %
NEUTROS ABS: 11.5 10*3/uL — AB (ref 1.7–7.7)
Neutrophils Relative %: 90 %
Platelets: 333 10*3/uL (ref 150–400)
RBC: 4.93 MIL/uL (ref 3.87–5.11)
RDW: 15.9 % — AB (ref 11.5–15.5)
WBC: 12.8 10*3/uL — ABNORMAL HIGH (ref 4.0–10.5)

## 2016-03-28 LAB — COMPREHENSIVE METABOLIC PANEL
ALBUMIN: 4.4 g/dL (ref 3.5–5.0)
ALK PHOS: 65 U/L (ref 38–126)
ALT: 16 U/L (ref 14–54)
ANION GAP: 10 (ref 5–15)
AST: 24 U/L (ref 15–41)
BUN: 12 mg/dL (ref 6–20)
CALCIUM: 9.4 mg/dL (ref 8.9–10.3)
CO2: 25 mmol/L (ref 22–32)
Chloride: 103 mmol/L (ref 101–111)
Creatinine, Ser: 1.02 mg/dL — ABNORMAL HIGH (ref 0.44–1.00)
GFR calc Af Amer: 59 mL/min — ABNORMAL LOW (ref >60)
GFR calc non Af Amer: 51 mL/min — ABNORMAL LOW (ref >60)
GLUCOSE: 166 mg/dL — AB (ref 65–99)
Potassium: 3.6 mmol/L (ref 3.5–5.1)
Sodium: 138 mmol/L (ref 135–145)
Total Bilirubin: 0.9 mg/dL (ref 0.3–1.2)
Total Protein: 7.7 g/dL (ref 6.5–8.1)

## 2016-03-28 LAB — HEPATIC FUNCTION PANEL
ALT: 13 U/L — AB (ref 14–54)
AST: 25 U/L (ref 15–41)
Albumin: 4.4 g/dL (ref 3.5–5.0)
Alkaline Phosphatase: 62 U/L (ref 38–126)
BILIRUBIN DIRECT: 0.2 mg/dL (ref 0.1–0.5)
BILIRUBIN INDIRECT: 0.7 mg/dL (ref 0.3–0.9)
TOTAL PROTEIN: 7.6 g/dL (ref 6.5–8.1)
Total Bilirubin: 0.9 mg/dL (ref 0.3–1.2)

## 2016-03-28 LAB — CBG MONITORING, ED: Glucose-Capillary: 152 mg/dL — ABNORMAL HIGH (ref 65–99)

## 2016-03-28 LAB — LIPASE, BLOOD: Lipase: 18 U/L (ref 11–51)

## 2016-03-28 LAB — MAGNESIUM: Magnesium: 2 mg/dL (ref 1.7–2.4)

## 2016-03-28 MED ORDER — PANTOPRAZOLE SODIUM 40 MG PO TBEC
40.0000 mg | DELAYED_RELEASE_TABLET | Freq: Every day | ORAL | Status: DC
  Administered 2016-03-28 – 2016-03-29 (×2): 40 mg via ORAL
  Filled 2016-03-28 (×2): qty 1

## 2016-03-28 MED ORDER — MOMETASONE FURO-FORMOTEROL FUM 200-5 MCG/ACT IN AERO: 2.0000 | INHALATION_SPRAY | Freq: Two times a day (BID) | RESPIRATORY_TRACT | Status: DC | PRN

## 2016-03-28 MED ORDER — HYDROMORPHONE HCL 2 MG/ML IJ SOLN
1.0000 mg | Freq: Once | INTRAMUSCULAR | Status: AC
  Administered 2016-03-28: 1 mg via INTRAMUSCULAR
  Filled 2016-03-28: qty 1

## 2016-03-28 MED ORDER — LOSARTAN POTASSIUM 50 MG PO TABS
100.0000 mg | ORAL_TABLET | Freq: Every day | ORAL | Status: DC
  Administered 2016-03-28 – 2016-03-29 (×2): 100 mg via ORAL
  Filled 2016-03-28 (×2): qty 2

## 2016-03-28 MED ORDER — GABAPENTIN 300 MG PO CAPS
300.0000 mg | ORAL_CAPSULE | Freq: Three times a day (TID) | ORAL | Status: DC
  Administered 2016-03-28 (×3): 300 mg via ORAL
  Filled 2016-03-28 (×4): qty 1

## 2016-03-28 MED ORDER — OXYCODONE HCL 5 MG PO TABS: 5.0000 mg | ORAL_TABLET | ORAL | Status: DC | PRN

## 2016-03-28 MED ORDER — HYDROMORPHONE HCL 2 MG/ML IJ SOLN: 0.5000 mg | INTRAMUSCULAR | Status: DC | PRN

## 2016-03-28 MED ORDER — ALBUTEROL SULFATE (2.5 MG/3ML) 0.083% IN NEBU: 2.5000 mg | INHALATION_SOLUTION | Freq: Four times a day (QID) | RESPIRATORY_TRACT | Status: DC | PRN

## 2016-03-28 MED ORDER — HYDRALAZINE HCL 20 MG/ML IJ SOLN: 5.0000 mg | INTRAMUSCULAR | Status: DC | PRN

## 2016-03-28 MED ORDER — METOPROLOL TARTRATE 50 MG PO TABS
50.0000 mg | ORAL_TABLET | Freq: Two times a day (BID) | ORAL | Status: DC
  Administered 2016-03-28 (×2): 50 mg via ORAL
  Filled 2016-03-28 (×3): qty 1

## 2016-03-28 MED ORDER — ENOXAPARIN SODIUM 40 MG/0.4ML ~~LOC~~ SOLN
40.0000 mg | SUBCUTANEOUS | Status: DC
  Administered 2016-03-28: 40 mg via SUBCUTANEOUS
  Filled 2016-03-28: qty 0.4

## 2016-03-28 MED ORDER — ALBUTEROL SULFATE HFA 108 (90 BASE) MCG/ACT IN AERS: 1.0000 | INHALATION_SPRAY | Freq: Four times a day (QID) | RESPIRATORY_TRACT | Status: DC | PRN

## 2016-03-28 MED ORDER — OXYCODONE HCL 5 MG PO TABS
5.0000 mg | ORAL_TABLET | Freq: Once | ORAL | Status: AC
  Administered 2016-03-28: 5 mg via ORAL
  Filled 2016-03-28: qty 1

## 2016-03-28 MED ORDER — ONDANSETRON 4 MG PO TBDP
4.0000 mg | ORAL_TABLET | Freq: Once | ORAL | Status: AC
  Administered 2016-03-28: 4 mg via ORAL
  Filled 2016-03-28: qty 1

## 2016-03-28 MED ORDER — ONDANSETRON HCL 4 MG/2ML IJ SOLN
4.0000 mg | INTRAMUSCULAR | Status: AC
  Administered 2016-03-28 (×4): 4 mg via INTRAVENOUS
  Filled 2016-03-28 (×4): qty 2

## 2016-03-28 MED ORDER — AMLODIPINE BESYLATE 10 MG PO TABS
10.0000 mg | ORAL_TABLET | Freq: Every day | ORAL | Status: DC
  Administered 2016-03-28 – 2016-03-29 (×2): 10 mg via ORAL
  Filled 2016-03-28 (×2): qty 1

## 2016-03-28 MED ORDER — MAGNESIUM OXIDE 400 (241.3 MG) MG PO TABS
400.0000 mg | ORAL_TABLET | Freq: Every day | ORAL | Status: DC
  Administered 2016-03-28 – 2016-03-29 (×2): 400 mg via ORAL
  Filled 2016-03-28 (×3): qty 1

## 2016-03-28 MED ORDER — DEXTROSE 5 % AND 0.9 % NACL IV BOLUS
1000.0000 mL | Freq: Once | INTRAVENOUS | Status: AC
  Administered 2016-03-28: 1000 mL via INTRAVENOUS

## 2016-03-28 MED ORDER — SODIUM CHLORIDE 0.9 % IV SOLN
INTRAVENOUS | Status: AC
  Administered 2016-03-28: 100 mL/h via INTRAVENOUS
  Administered 2016-03-28: 1000 mL via INTRAVENOUS

## 2016-03-28 MED ORDER — PROMETHAZINE HCL 25 MG/ML IJ SOLN
12.5000 mg | Freq: Once | INTRAMUSCULAR | Status: AC
  Administered 2016-03-28: 12.5 mg via INTRAVENOUS
  Filled 2016-03-28: qty 1

## 2016-03-28 MED ORDER — POLYETHYLENE GLYCOL 3350 17 G PO PACK
17.0000 g | PACK | Freq: Two times a day (BID) | ORAL | Status: DC
  Administered 2016-03-28 – 2016-03-29 (×2): 17 g via ORAL
  Filled 2016-03-28 (×2): qty 1

## 2016-03-28 MED ORDER — OXYCODONE HCL ER 10 MG PO T12A
10.0000 mg | EXTENDED_RELEASE_TABLET | Freq: Two times a day (BID) | ORAL | Status: DC
  Administered 2016-03-28 – 2016-03-29 (×3): 10 mg via ORAL
  Filled 2016-03-28 (×3): qty 1

## 2016-03-28 MED ORDER — SODIUM CHLORIDE 0.9 % IV BOLUS (SEPSIS)
1000.0000 mL | Freq: Once | INTRAVENOUS | Status: AC
  Administered 2016-03-28: 1000 mL via INTRAVENOUS

## 2016-03-28 MED ORDER — INSULIN ASPART 100 UNIT/ML ~~LOC~~ SOLN: 0.0000 [IU] | Freq: Three times a day (TID) | SUBCUTANEOUS | Status: DC

## 2016-03-28 MED ORDER — SODIUM CHLORIDE 0.9 % IV SOLN
10.0000 mg | Freq: Two times a day (BID) | INTRAVENOUS | Status: DC
  Administered 2016-03-28 – 2016-03-29 (×3): 10 mg via INTRAVENOUS
  Filled 2016-03-28 (×4): qty 1

## 2016-03-28 MED ORDER — ALPRAZOLAM 0.5 MG PO TABS: 0.5000 mg | ORAL_TABLET | Freq: Two times a day (BID) | ORAL | Status: DC | PRN

## 2016-03-28 NOTE — H&P (Signed)
History and Physical    [name] [name] [id number] DOB: 1936-03-27 DOA: 03/28/2016  PCP: Alesia Richards, MD Patient coming from: home  Chief Complaint: persistent nausea/vomiting  HPI: [name] [name] is a 80 y.o. female with medical history significant for esophageal strictures, prior reflux, fundoplication, long-standing intermittent nausea/vomiting/dysphasia for which she is followed bile about our gastroenterology, history of esophageal candidiasis, hypertension, chronic pain syndrome since to the emergency department with the chief complaint of persistent intractable nausea and vomiting.  Information is obtained from the patient. She reports sudden onset abdominal pain starting yesterday morning. She states the pain is located intermittent on upper abdominal quadrants and she has chronic abdominal pain states this is the same as that. Associated symptoms include persistent nausea vomiting. Vomiting makes the abdominal pain worse. She reports having more than 15 episodes of vomiting. Initially it was undigested food and now is dry heaving. She denies any coffee ground emesis. He denies headache fever chills shortness of breath chest pain palpitations. She denies dysuria hematuria frequency or urgency. She denies any diarrhea constipation melena bright red blood per rectum. She states she came to the emergency department yesterday was given pain medicine and antinausea medication with moderate relief and discharged. And symptoms didn't improve she came back. She also reports chronic back pain for which she takes medication and has not taken any in 2 days.    ED Course: In the emergency department she is given Zofran IV fluids Dilaudid provided with fluid challenge which she failed. She is afebrile hemodynamically stable and nontoxic appearing  Review of Systems: As per HPI otherwise 10 point review of systems negative.   Ambulatory Status: She ambulates independently no recent  falls  Past Medical History:  Diagnosis Date  . Anxiety    takes Xanax daily as needed  . Asthma    Albuterol daily as needed  . CAD (coronary artery disease)    LHC 2003 with 40% pLAD, 30% mLAD, 100% D1 (moderate vessel), 100%  D2 (small vessel), 95% mid small OM2.  Pt had PTCA to D1;  D2 and OM2 small vessels and tx medically;  Last Myoview (2012): anterior infarct seen with scar, no ischemia. EF normal. Pt managed medically;  Lexiscan Myoview (12/14):  Low risk; ant scar with very mild peri-infarct ischemia; EF 55% with ant AK   . Chronic back pain    HNP, DDD, spinal stenosis, vertebral compression fractures.   . Chronic nausea    takes Zofran daily as needed.  normal gastric emptying study 03/2013  . Constipation    felt to be functional. takes Senokot and Miralax daily.  treated with Linzess in past.   . DIVERTICULOSIS, COLON 08/28/2008   Qualifier: Diagnosis of  By: [name], [name], [name]    . Elevated LFTs 2015   probably med induced DILI, from Augmentin vs Pravastatin  . GERD (gastroesophageal reflux disease)    hx of esophageal stricture   . Hiatal hernia    Paraesophageal hernias. s/p fundoplications in 0000000 and 04/2013  . History of bronchitis several of yrs ago  . History of colon polyps 03/2009, 03/2011   adenomatous colon polyps, no high grade dysplasia.   [name] History of vertigo    no meds  . Hyperlipidemia    takes Pravastatin daily  . Hypertension    takes Amlodipine,Metoprolol,and Losartan daily  . Osteoporosis   . PONV (postoperative nausea and vomiting)    yrs ago  . Slow urinary stream    takes Rapaflo daily  .  TIA (transient ischemic attack) 1995   no residual effects noted    Past Surgical History:  Procedure Laterality Date  . APPENDECTOMY     patient unsure of date  . BACK SURGERY    . BLADDER REPAIR    . CHOLECYSTECTOMY     Patient unsure of date.  . CORONARY ANGIOPLASTY  11/16/2001   1 stent  . ESOPHAGEAL MANOMETRY N/A 03/25/2013   Procedure:  ESOPHAGEAL MANOMETRY (EM);  Surgeon: Inda Castle, MD;  Location: [facility];  Service: Endoscopy;  Laterality: N/A;  . ESOPHAGOGASTRODUODENOSCOPY N/A 03/15/2013   Procedure: ESOPHAGOGASTRODUODENOSCOPY (EGD);  Surgeon: Jerene Bears, MD;  Location: [facility]COPY;  Service: Endoscopy;  Laterality: N/A;  . ESOPHAGOGASTRODUODENOSCOPY N/A 12/25/2015   Procedure: ESOPHAGOGASTRODUODENOSCOPY (EGD);  Surgeon: Doran Stabler, MD;  Location: [facility] ENDOSCOPY;  Service: Endoscopy;  Laterality: N/A;  . EYE SURGERY  11/2000   bilateral cataracts with lens implant  . FIXATION KYPHOPLASTY LUMBAR SPINE  X 2   "L3-4"  . HEMORRHOID SURGERY    . HIATAL HERNIA REPAIR  06/03/2011   Procedure: LAPAROSCOPIC REPAIR OF HIATAL HERNIA;  Surgeon: Adin Hector, MD;  Location: [facility];  Service: General;  Laterality: N/A;  . HIATAL HERNIA REPAIR N/A 04/24/2013   Procedure: LAPAROSCOPIC  REPAIR RECURRENT PARASOPHAGEAL HIATAL HERNIA WITH FUNDOPLICATION;  Surgeon: Adin Hector, MD;  Location: [facility];  Service: General;  Laterality: N/A;  . INSERTION OF MESH N/A 04/24/2013   Procedure: INSERTION OF MESH;  Surgeon: Adin Hector, MD;  Location: [facility];  Service: General;  Laterality: N/A;  . LAPAROSCOPIC LYSIS OF ADHESIONS N/A 04/24/2013   Procedure: LAPAROSCOPIC LYSIS OF ADHESIONS;  Surgeon: Adin Hector, MD;  Location: [facility];  Service: General;  Laterality: N/A;  . LAPAROSCOPIC NISSEN FUNDOPLICATION  [id number]   Procedure: LAPAROSCOPIC NISSEN FUNDOPLICATION;  Surgeon: Adin Hector, MD;  Location: [facility];  Service: General;  Laterality: N/A;  . LUMBAR FUSION  12/07/2015   Right L2-3 facetectomy, posterolateral fusion L2-3 with pedicle screws, rods, local bone graft, Vivigen cancellous chips. Fusion extended to the L1 level with pedicle screws and rods  . LUMBAR LAMINECTOMY/DECOMPRESSION MICRODISCECTOMY Right 06/26/2012   Procedure: LUMBAR LAMINECTOMY/DECOMPRESSION MICRODISCECTOMY;  Surgeon: Jessy Oto, MD;   Location: [place];  Service: Orthopedics;  Laterality: Right;  RIGHT L4-5 MICRODISCECTOMY  . LUMBAR LAMINECTOMY/DECOMPRESSION MICRODISCECTOMY N/A 05/29/2015   Procedure: RIGHT L2-3 MICRODISCECTOMY;  Surgeon: Jessy Oto, MD;  Location: [place];  Service: Orthopedics;  Laterality: N/A;  . TOTAL ABDOMINAL HYSTERECTOMY  1975   partial    Social History   Social History  . Marital status: Married    Spouse name: [name]  . Number of children: 2  . Years of education: 12   Occupational History  . Retired Retired    worked at [employer]  . Smoking status: Never Smoker  . Smokeless tobacco: Never Used  . Alcohol use No  . Drug use: No  . Sexual activity: No   Other Topics Concern  . Not on file   Social History Narrative   Lives with husband.   Right-handed.   3 cups caffeine per day.    Allergies  Allergen Reactions  . Ace Inhibitors Cough    Per Dr Melvyn Novas pulmonology 2013  . Aspartame Diarrhea and Nausea And Vomiting  . Atorvastatin Other (See Comments)    Muscle aches  . Biaxin [Clarithromycin] Other (See Comments)  PATIENT CAN TOLERATE Z-PAK.  Is written on patient's paper chart.  [name] [Roflumilast] Nausea And Vomiting  . Erythromycin Other (See Comments)    PATIENT CAN TOLERATE Z-PAK.  It is written on patient's paper chart.  . Levofloxacin Nausea And Vomiting    Family History  Problem Relation Age of Onset  . Colon cancer Mother 6  . Heart disease Father 74    heart attack    Prior to Admission medications   Medication Sig Start Date End Date Taking? Authorizing Provider  acetaminophen (TYLENOL) 325 MG tablet Take 2 tablets (650 mg total) by mouth every 6 (six) hours as needed for mild pain (or Fever >/= 101). 12/26/15  Yes Courage Emokpae, MD  albuterol (PROVENTIL HFA;VENTOLIN HFA) 108 (90 BASE) MCG/ACT inhaler Inhale 1-2 puffs into the lungs every 6 (six) hours as needed for wheezing or shortness of breath.   Yes  Historical Provider, MD  ALPRAZolam Duanne Moron) 0.5 MG tablet TAKE ONE-HALF TO ONE TABLET BY MOUTH TWO TO THREE TIMES DAILY FOR NERVES 01/14/16  Yes Vicie Mutters, PA-C  amLODipine (NORVASC) 10 MG tablet Take 1 tablet (10 mg total) by mouth at bedtime. Patient taking differently: Take 10 mg by mouth daily.  09/20/14  Yes Reyne Dumas, MD  Cholecalciferol (VITAMIN D3) 2000 UNITS TABS Take 6,000 Units by mouth daily.    Yes Historical Provider, MD  gabapentin (NEURONTIN) 300 MG capsule Take 1 capsule (300 mg total) by mouth 3 (three) times daily. 01/21/16  Yes [name], MD  ipratropium-albuterol (DUONEB) 0.5-2.5 (3) MG/3ML SOLN USE ONE VIAL IN NEBULIZER 4 TIMES DAILY Patient taking differently: Inhale 3 mLs into the lungs every 6 (six) hours as needed. For shortness of breath 02/26/14  Yes [name], MD  losartan (COZAAR) 100 MG tablet TAKE ONE TABLET BY MOUTH ONCE DAILY ( DISCONTINUE BENICAR) 09/30/15  Yes Vicie Mutters, PA-C  magnesium oxide (MAG-OX) 400 MG tablet Take 400 mg by mouth daily.   Yes Historical Provider, MD  metFORMIN (GLUCOPHAGE-XR) 500 MG 24 hr tablet TAKE ONE TO TWO TABLETS BY MOUTH TWICE DAILY WITH MEALS FOR DIABETES 01/12/16  Yes [name], MD  metoprolol (LOPRESSOR) 50 MG tablet TAKE ONE TABLET BY MOUTH TWICE DAILY FOR BLOOD PRESSURE 02/11/16  Yes Vicie Mutters, PA-C  mometasone-formoterol (DULERA) 200-5 MCG/ACT AERO Inhale 2 puffs into the lungs 2 (two) times daily. Patient taking differently: Inhale 2 puffs into the lungs 2 (two) times daily as needed for wheezing.  07/03/13  Yes Kelby Aline, PA-C  nitroGLYCERIN (NITROSTAT) 0.4 MG SL tablet Place 1 tablet (0.4 mg total) under the tongue every 5 (five) minutes as needed for chest pain. 09/04/14  Yes Larey Dresser, MD  orphenadrine (NORFLEX) 100 MG tablet Take 1 tablet (100 mg total) by mouth 2 (two) times daily as needed for muscle spasms. 05/30/15  Yes Jessy Oto, MD  OVER THE COUNTER MEDICATION Take 1,000 mg by  mouth 2 (two) times daily. [place] Calcium 1000mg    Yes Historical Provider, MD  oxyCODONE (OXY IR/ROXICODONE) 5 MG immediate release tablet Take 1 tablet (5 mg total) by mouth every 4 (four) hours as needed for severe pain or breakthrough pain. 12/26/15  Yes Courage Denton Brick, MD  oxyCODONE (OXYCONTIN) 10 mg 12 hr tablet Take 1 tablet (10 mg total) by mouth every 12 (twelve) hours. 12/26/15  Yes Courage Emokpae, MD  pantoprazole (PROTONIX) 40 MG tablet Take 1 tablet (40 mg total) by mouth daily. 03/18/15  Yes [name], MD  polyethylene glycol (MIRALAX / GLYCOLAX) packet Take 17 g by mouth 2 (two) times daily. 12/11/15  Yes Jessy Oto, MD  potassium chloride SA (K-DUR,KLOR-CON) 20 MEQ tablet Take 1 tablet (20 mEq total) by mouth 2 (two) times daily. 11/25/14  Yes [name], MD  pravastatin (PRAVACHOL) 40 MG tablet Take 1 tablet (40 mg total) by mouth daily. 07/09/15  Yes [name], MD  promethazine (PHENERGAN) 25 MG tablet Take 1 tablet (25 mg total) by mouth every 6 (six) hours as needed for nausea or vomiting. 03/27/16  Yes Fredia Sorrow, MD  senna-docusate (SENOKOT-S) 8.6-50 MG tablet Take 2 tablets by mouth 2 (two) times daily. 12/26/15  Yes Courage Emokpae, MD  silodosin (RAPAFLO) 8 MG CAPS capsule Take 8 mg by mouth every other day.    Yes Historical Provider, MD  sucralfate (CARAFATE) 1 g tablet TAKE ONE TABLET BY MOUTH 4 TIMES DAILY BEFORE MEALS AND AT BEDTIME FOR HEARTBURN 01/06/16  Yes [name], MD    Physical Exam: Vitals:   03/28/16 0545 03/28/16 0600 03/28/16 0630 03/28/16 0713  BP:  150/72 151/85 (!) 172/104  Pulse: 113 95 90 95  Resp:    19  Temp:      TempSrc:      SpO2: 98% 99% 100% 98%     General:  Appears Anxious slightly uncomfortable and is dry heaving Eyes:  PERRL, EOMI, normal lids, iris ENT:  grossly normal hearing, lips & tongue, mucous membranes of her mouth are pink but dry Neck:  no LAD, masses or thyromegaly Cardiovascular:  RRR, no m/r/g.  No LE edema. Pedal pulses present and palpable Respiratory:  CTA bilaterally, no w/r/r. Normal respiratory effort. Abdomen:  soft, nondistended nontender sluggish bowel sounds, no guarding or rebounding Skin:  no rash or induration seen on limited exam Musculoskeletal:  grossly normal tone BUE/BLE, good ROM, no bony abnormality Psychiatric:  grossly normal mood and affect, speech fluent and appropriate, AOx3 Neurologic:  CN 2-12 grossly intact, moves all extremities in coordinated fashion, sensation intact  Labs on Admission: I have personally reviewed following labs and imaging studies  CBC:  Recent Labs Lab 03/27/16 0948 03/28/16 0254  WBC 7.5 12.8*  NEUTROABS  --  11.5*  HGB 14.9 14.4  HCT 45.1 43.4  MCV 87.7 88.0  PLT 371 0000000   Basic Metabolic Panel:  Recent Labs Lab 03/27/16 0948 03/28/16 0254  NA 138 138  K 5.0 3.6  CL 104 103  CO2 22 25  GLUCOSE 158* 166*  BUN 12 12  CREATININE 1.00 1.02*  CALCIUM 10.2 9.4  MG  --  2.0   GFR: CrCl cannot be calculated (Unknown ideal weight.). Liver Function Tests:  Recent Labs Lab 03/27/16 0948 03/28/16 0254  AST 32 24  ALT 17 16  ALKPHOS 68 65  BILITOT 0.8 0.9  PROT 7.8 7.7  ALBUMIN 4.4 4.4    Recent Labs Lab 03/27/16 0948 03/28/16 0254  LIPASE 17 18   No results for input(s): AMMONIA in the last 168 hours. Coagulation Profile: No results for input(s): INR, PROTIME in the last 168 hours. Cardiac Enzymes: No results for input(s): CKTOTAL, CKMB, CKMBINDEX, TROPONINI in the last 168 hours. BNP (last 3 results) No results for input(s): PROBNP in the last 8760 hours. HbA1C: No results for input(s): HGBA1C in the last 72 hours. CBG: No results for input(s): GLUCAP in the last 168 hours. Lipid Profile: No results for input(s): CHOL, HDL, LDLCALC, TRIG, CHOLHDL, LDLDIRECT in the last 72  hours. Thyroid Function Tests: No results for input(s): TSH, T4TOTAL, FREET4, T3FREE, THYROIDAB in the last 72  hours. Anemia Panel: No results for input(s): VITAMINB12, FOLATE, FERRITIN, TIBC, IRON, RETICCTPCT in the last 72 hours. Urine analysis:    Component Value Date/Time   COLORURINE YELLOW 12/09/2015 [place] 12/09/2015 1542   LABSPEC 1.016 12/09/2015 1542   PHURINE 7.5 12/09/2015 1542   GLUCOSEU NEGATIVE 12/09/2015 1542   HGBUR NEGATIVE 12/09/2015 1542   BILIRUBINUR NEGATIVE 12/09/2015 1542   KETONESUR 15 (A) 12/09/2015 1542   PROTEINUR NEGATIVE 12/09/2015 1542   UROBILINOGEN 0.2 09/19/2014 2235   NITRITE NEGATIVE 12/09/2015 1542   LEUKOCYTESUR NEGATIVE 12/09/2015 1542    Creatinine Clearance: CrCl cannot be calculated (Unknown ideal weight.).  Sepsis Labs: @LABRCNTIP (procalcitonin:4,lacticidven:4) )No results found for this or any previous visit (from the past 240 hour(s)).   Radiological Exams on Admission: Ct Abdomen Pelvis W Contrast  Result Date: 03/27/2016 CLINICAL DATA:  80 year old female with abdominal and pelvic pain with nausea and vomiting today. EXAM: CT ABDOMEN AND PELVIS WITH CONTRAST TECHNIQUE: Multidetector CT imaging of the abdomen and pelvis was performed using the standard protocol following bolus administration of intravenous contrast. CONTRAST:  172mL ISOVUE-300 IOPAMIDOL (ISOVUE-300) INJECTION 61% COMPARISON:  12/23/2015 CT FINDINGS: Lower chest: No acute abnormality.  Cardiomegaly again identified. Hepatobiliary: No significant hepatic abnormality. Patient is status post cholecystectomy. Fullness of the CBD which measures 12 mm is unchanged from 2015. Pancreas: No significant abnormality. Spleen: Unremarkable Adrenals/Urinary Tract: The kidneys, adrenal glands and bladder are unremarkable except for probable tiny renal cysts. Stomach/Bowel: Hiatal hernia repair changes again noted. There is no evidence of bowel obstruction or definite bowel wall thickening. Colonic diverticulosis noted without evidence of diverticulitis. Vascular/Lymphatic:  Abdominal aortic atherosclerotic calcifications noted without aneurysm. Reproductive: Status post hysterectomy. No adnexal masses. Other:  No ascites, abscess or pneumoperitoneum. Musculoskeletal: No acute or suspicious abnormalities. L1 compression fracture, L2-L4 compression fractures and vertebral augmentation changes and posterior fusion from L1-2 L3 again noted. IMPRESSION: No evidence of acute abnormality. Cardiomegaly, colonic diverticulosis and hiatal hernia repair changes. Abdominal aortic atherosclerosis. Lumbar spine compression fractures and vertebral augmentation changes again noted. Electronically Signed   By: Margarette Canada M.D.   On: 03/27/2016 14:52   Dg Abd Acute W/chest  Result Date: 03/27/2016 CLINICAL DATA:  Abdominal pain and vomiting EXAM: DG ABDOMEN ACUTE W/ 1V CHEST COMPARISON:  Body CT 12/23/2015 FINDINGS: Normal heart size and mediastinal contours. No acute infiltrate or edema. No effusion or pneumothorax. No acute osseous findings. Nonobstructive bowel gas pattern. No concerning mass effect or gas collection. No evidence of renal calculus. Lumbar spine fixation and multilevel vertebroplasty. No acute osseous finding. Cholecystectomy clips. IMPRESSION: No evidence of acute disease in the chest or abdomen. Electronically Signed   By: Monte Fantasia M.D.   On: 03/27/2016 12:06    EKG: Independently reviewed. Sinus rhythm Low voltage, precordial leads Probable anteroseptal infarct, old Borderline T abnormalities, inferior leads  Assessment/Plan Principal Problem:   Intractable nausea and vomiting Active Problems:   Essential hypertension   MGUS (monoclonal gammopathy of unknown significance)   Constipation, chronic   Chronic pain syndrome   GERD    Leukocytosis   Acute kidney injury ([place])   #1. Intractable nausea and vomiting. Acute on chronic. Patient with a long-standing intermittent history of same for which she is followed [facility] gastroenterology. chart Review  indicates she's had multiple studies and procedures including fundoplication.  CT abdomen done yesterday unremarkable. Lipase within the  limits of normal. No signs symptoms of infectious process. Denies dizziness Chart review indicates admitted 3 months ago for same underwent EGD at that time that was unremarkable and noted "Chronic symptoms appear to be due to the fundoplication and perhaps age-related decreased esophageal motility. No lesion amenable to dilation". Of note last hospitalization she had a esophagram/barium swallow indicating findings compatible with stricture in the distal third of esophagus. -Admit to MedSurg -IV fluids -Scheduled Zofran -Phenergan for breakthrough -pepcid -Clear liquids -Advance diet as tolerated -Follow urinalysis -If no improvement 24 hours consider GI consult  #2. Acute kidney injury. Mild. Likely related to above.[name] -IV fluids -Monitor urine output -Recheck in the morning  #3. Hypertension. Home medications include amlodipine, Cozaar, metoprolol -Continue these as tolerated -When necessary hydralazine IV -Monitor  4. Leukocytosis. Mild. She is afebrile nontoxic appearing -Follow urinalysis -IV fluids as noted above -Monitor closely -Recheck in the morning  #5. Prediabetes. Home medications include oral agents. Serum glucose 155 on admission. -We'll hold home agents for now -Obtain a hemoglobin A1c -Sliding scale insulin for optimal control  6. History of chronic pain. Chart review indicates this related to degenerative disc disease and chronic pain syndrome since her first lumbar disc surgery in 2014. Chart also indicates in January 2017 underwent lumbar decompression and in July 2017 had a lumbar fusion. Patient is managed by Dr. Nicholaus Bloom for her opioids. -Continue home meds as able  #7. History of asthma. Stable at baseline. Continue home inhalers  #8. Depression/anxiety. Home medications include Xanax -Continue  #9. History of  hiatal hernia s/p fundoplication. CT of the abdomen done yesterday unremarkable. home medications include PPI -continue home meds    DVT prophylaxis: lovenox  Code Status: full  Family Communication: none present  Disposition Plan: home  Consults called: none  Admission status: obs    Dyanne Carrel M MD [facility]  If 7PM-7AM, please contact night-coverage [email or web address] Password [facility]  03/28/2016, 7:37 AM

## 2016-03-28 NOTE — ED Provider Notes (Addendum)
[facility] DEPT Provider Note   CSN: [id number] Arrival date & time: 03/28/16  0101   By signing my name below, I, [name] [name], attest that this documentation has been prepared under the direction and in the presence of [name], MD . Electronically Signed: [name], Scribe. 03/28/2016. 2:01 AM.  History   Chief Complaint Chief Complaint  Patient presents with  . Abdominal Pain  . Emesis   The history is provided by the patient and the spouse. No language interpreter was used.   HPI Comments:  [name] [name] is a 80 y.o. female with pmhx of GERD who presents to the Emergency Department complaining of sudden-onset constant abdominal pain beginning yesterday. She describes the pain as being located in her middle and upper abdominal quadrants, exacerbated by vomiting. Pt states she has had similar pain in the past, which was diagnosed as food poisoning. She notes that she typically takes at-home pain medication for back pain, but hasnt taken any in two days. Pt was seen yesterday for similar and was given pain medication and antinausea medication with moderate relief. Pt's husband reports associated vomiting(>10 times), change in appetite, and nausea. She denies any chest pain, shortness of breath, dizziness, light-headedness. Pt's last bowel movement was earlier tonight and she notes that it was normal.    Past Medical History:  Diagnosis Date  . Anxiety    takes Xanax daily as needed  . Asthma    Albuterol daily as needed  . CAD (coronary artery disease)    LHC 2003 with 40% pLAD, 30% mLAD, 100% D1 (moderate vessel), 100%  D2 (small vessel), 95% mid small OM2.  Pt had PTCA to D1;  D2 and OM2 small vessels and tx medically;  Last Myoview (2012): anterior infarct seen with scar, no ischemia. EF normal. Pt managed medically;  Lexiscan Myoview (12/14):  Low risk; ant scar with very mild peri-infarct ischemia; EF 55% with ant AK   . Chronic back pain    HNP, DDD, spinal  stenosis, vertebral compression fractures.   . Chronic nausea    takes Zofran daily as needed.  normal gastric emptying study 03/2013  . Constipation    felt to be functional. takes Senokot and Miralax daily.  treated with Linzess in past.   . DIVERTICULOSIS, COLON 08/28/2008   Qualifier: Diagnosis of  By: [name], [name], [name]    . Elevated LFTs 2015   probably med induced DILI, from Augmentin vs Pravastatin  . GERD (gastroesophageal reflux disease)    hx of esophageal stricture   . Hiatal hernia    Paraesophageal hernias. s/p fundoplications in 0000000 and 04/2013  . History of bronchitis several of yrs ago  . History of colon polyps 03/2009, 03/2011   adenomatous colon polyps, no high grade dysplasia.   [name] History of vertigo    no meds  . Hyperlipidemia    takes Pravastatin daily  . Hypertension    takes Amlodipine,Metoprolol,and Losartan daily  . Osteoporosis   . PONV (postoperative nausea and vomiting)    yrs ago  . Slow urinary stream    takes Rapaflo daily  . TIA (transient ischemic attack) 1995   no residual effects noted    Patient Active Problem List   Diagnosis Date Noted  . Intractable nausea and vomiting 03/28/2016  . Leukocytosis 03/28/2016  . Acute kidney injury ([place]) 03/28/2016  . Uncontrollable vomiting   . Dysphagia   . Nausea and vomiting 12/23/2015  . Ileus ([place])   . Acute  right lumbar radiculopathy 12/07/2015    Class: Chronic  . Other forms of scoliosis, lumbar region 12/07/2015    Class: Chronic  . Spinal stenosis of lumbar region with neurogenic claudication 12/07/2015  . HNP (herniated nucleus pulposus), lumbar 05/29/2015    Class: Chronic  . Spinal stenosis, lumbar region, with neurogenic claudication 05/29/2015  . DDD (degenerative disc disease), lumbar 04/21/2015  . Encounter for Medicare annual wellness exam 02/27/2015  . Mixed hyperlipidemia 11/24/2014  . GERD  11/24/2014  . Depression, major, in partial remission ([place]) 09/26/2014  .  Chronic pain syndrome 09/26/2014  . Coronary artery disease due to lipid rich plaque   . Osteoporosis 02/10/2014  . Prediabetes 08/14/2013  . Vitamin D deficiency 08/14/2013  . Medication management 08/14/2013  . Constipation, chronic 04/03/2013  . MGUS (monoclonal gammopathy of unknown significance) 03/05/2013  . Herniated lumbar intervertebral disc 06/25/2012    Class: Acute  . Vertebral compression fracture ([place]) 06/23/2012  . ESOPHAGEAL STRICTURE 08/28/2008  . Asthma 07/29/2008  . Incarcerated recurrent paraesophageal hiatal hernia s/p lap redo repair [id number] 01/31/2008  . Essential hypertension 07/25/2007    Past Surgical History:  Procedure Laterality Date  . APPENDECTOMY     patient unsure of date  . BACK SURGERY    . BLADDER REPAIR    . CHOLECYSTECTOMY     Patient unsure of date.  . CORONARY ANGIOPLASTY  11/16/2001   1 stent  . ESOPHAGEAL MANOMETRY N/A 03/25/2013   Procedure: ESOPHAGEAL MANOMETRY (EM);  Surgeon: Inda Castle, MD;  Location: [facility] ENDOSCOPY;  Service: Endoscopy;  Laterality: N/A;  . ESOPHAGOGASTRODUODENOSCOPY N/A 03/15/2013   Procedure: ESOPHAGOGASTRODUODENOSCOPY (EGD);  Surgeon: Jerene Bears, MD;  Location: [facility] ENDOSCOPY;  Service: Endoscopy;  Laterality: N/A;  . ESOPHAGOGASTRODUODENOSCOPY N/A 12/25/2015   Procedure: ESOPHAGOGASTRODUODENOSCOPY (EGD);  Surgeon: Doran Stabler, MD;  Location: [facility] ENDOSCOPY;  Service: Endoscopy;  Laterality: N/A;  . EYE SURGERY  11/2000   bilateral cataracts with lens implant  . FIXATION KYPHOPLASTY LUMBAR SPINE  X 2   "L3-4"  . HEMORRHOID SURGERY    . HIATAL HERNIA REPAIR  06/03/2011   Procedure: LAPAROSCOPIC REPAIR OF HIATAL HERNIA;  Surgeon: Adin Hector, MD;  Location: [facility] ORS;  Service: General;  Laterality: N/A;  . HIATAL HERNIA REPAIR N/A 04/24/2013   Procedure: LAPAROSCOPIC  REPAIR RECURRENT PARASOPHAGEAL HIATAL HERNIA WITH FUNDOPLICATION;  Surgeon: Adin Hector, MD;  Location: [facility] ORS;  Service: General;   Laterality: N/A;  . INSERTION OF MESH N/A 04/24/2013   Procedure: INSERTION OF MESH;  Surgeon: Adin Hector, MD;  Location: [facility] ORS;  Service: General;  Laterality: N/A;  . LAPAROSCOPIC LYSIS OF ADHESIONS N/A 04/24/2013   Procedure: LAPAROSCOPIC LYSIS OF ADHESIONS;  Surgeon: Adin Hector, MD;  Location: [facility] ORS;  Service: General;  Laterality: N/A;  . LAPAROSCOPIC NISSEN FUNDOPLICATION  [id number]   Procedure: LAPAROSCOPIC NISSEN FUNDOPLICATION;  Surgeon: Adin Hector, MD;  Location: [facility] ORS;  Service: General;  Laterality: N/A;  . LUMBAR FUSION  12/07/2015   Right L2-3 facetectomy, posterolateral fusion L2-3 with pedicle screws, rods, local bone graft, Vivigen cancellous chips. Fusion extended to the L1 level with pedicle screws and rods  . LUMBAR LAMINECTOMY/DECOMPRESSION MICRODISCECTOMY Right 06/26/2012   Procedure: LUMBAR LAMINECTOMY/DECOMPRESSION MICRODISCECTOMY;  Surgeon: Jessy Oto, MD;  Location: [facility];  Service: Orthopedics;  Laterality: Right;  RIGHT L4-5 MICRODISCECTOMY  . LUMBAR LAMINECTOMY/DECOMPRESSION MICRODISCECTOMY N/A 05/29/2015   Procedure: RIGHT L2-3 MICRODISCECTOMY;  Surgeon: Jessy Oto, MD;  Location: [facility];  Service: Orthopedics;  Laterality: N/A;  . TOTAL ABDOMINAL HYSTERECTOMY  1975   partial    OB History    No data available       Home Medications    Prior to Admission medications   Medication Sig Start Date End Date Taking? Authorizing Provider  acetaminophen (TYLENOL) 325 MG tablet Take 2 tablets (650 mg total) by mouth every 6 (six) hours as needed for mild pain (or Fever >/= 101). 12/26/15  Yes Courage Emokpae, MD  albuterol (PROVENTIL HFA;VENTOLIN HFA) 108 (90 BASE) MCG/ACT inhaler Inhale 1-2 puffs into the lungs every 6 (six) hours as needed for wheezing or shortness of breath.   Yes Historical Provider, MD  ALPRAZolam Duanne Moron) 0.5 MG tablet TAKE ONE-HALF TO ONE TABLET BY MOUTH TWO TO THREE TIMES DAILY FOR NERVES 01/14/16  Yes Vicie Mutters, PA-C    amLODipine (NORVASC) 10 MG tablet Take 1 tablet (10 mg total) by mouth at bedtime. Patient taking differently: Take 10 mg by mouth daily.  09/20/14  Yes Reyne Dumas, MD  Cholecalciferol (VITAMIN D3) 2000 UNITS TABS Take 6,000 Units by mouth daily.    Yes Historical Provider, MD  gabapentin (NEURONTIN) 300 MG capsule Take 1 capsule (300 mg total) by mouth 3 (three) times daily. 01/21/16  Yes [name], MD  ipratropium-albuterol (DUONEB) 0.5-2.5 (3) MG/3ML SOLN USE ONE VIAL IN NEBULIZER 4 TIMES DAILY Patient taking differently: Inhale 3 mLs into the lungs every 6 (six) hours as needed. For shortness of breath 02/26/14  Yes [name], MD  losartan (COZAAR) 100 MG tablet TAKE ONE TABLET BY MOUTH ONCE DAILY ( DISCONTINUE BENICAR) 09/30/15  Yes Vicie Mutters, PA-C  magnesium oxide (MAG-OX) 400 MG tablet Take 400 mg by mouth daily.   Yes Historical Provider, MD  metFORMIN (GLUCOPHAGE-XR) 500 MG 24 hr tablet TAKE ONE TO TWO TABLETS BY MOUTH TWICE DAILY WITH MEALS FOR DIABETES 01/12/16  Yes [name], MD  metoprolol (LOPRESSOR) 50 MG tablet TAKE ONE TABLET BY MOUTH TWICE DAILY FOR BLOOD PRESSURE 02/11/16  Yes Vicie Mutters, PA-C  mometasone-formoterol (DULERA) 200-5 MCG/ACT AERO Inhale 2 puffs into the lungs 2 (two) times daily. Patient taking differently: Inhale 2 puffs into the lungs 2 (two) times daily as needed for wheezing.  07/03/13  Yes Kelby Aline, PA-C  nitroGLYCERIN (NITROSTAT) 0.4 MG SL tablet Place 1 tablet (0.4 mg total) under the tongue every 5 (five) minutes as needed for chest pain. 09/04/14  Yes Larey Dresser, MD  orphenadrine (NORFLEX) 100 MG tablet Take 1 tablet (100 mg total) by mouth 2 (two) times daily as needed for muscle spasms. 05/30/15  Yes Jessy Oto, MD  OVER THE COUNTER MEDICATION Take 1,000 mg by mouth 2 (two) times daily. [place] Calcium 1000mg    Yes Historical Provider, MD  oxyCODONE (OXY IR/ROXICODONE) 5 MG immediate release tablet Take 1 tablet (5 mg total)  by mouth every 4 (four) hours as needed for severe pain or breakthrough pain. 12/26/15  Yes Courage Denton Brick, MD  oxyCODONE (OXYCONTIN) 10 mg 12 hr tablet Take 1 tablet (10 mg total) by mouth every 12 (twelve) hours. 12/26/15  Yes Courage Emokpae, MD  pantoprazole (PROTONIX) 40 MG tablet Take 1 tablet (40 mg total) by mouth daily. 03/18/15  Yes [name], MD  polyethylene glycol [facility] / GLYCOLAX) packet Take 17 g by mouth 2 (two) times daily. 12/11/15  Yes Jessy Oto, MD  potassium chloride SA (K-DUR,KLOR-CON) 20 MEQ tablet Take 1 tablet (20  mEq total) by mouth 2 (two) times daily. 11/25/14  Yes [name], MD  pravastatin (PRAVACHOL) 40 MG tablet Take 1 tablet (40 mg total) by mouth daily. 07/09/15  Yes [name], MD  promethazine (PHENERGAN) 25 MG tablet Take 1 tablet (25 mg total) by mouth every 6 (six) hours as needed for nausea or vomiting. 03/27/16  Yes Fredia Sorrow, MD  senna-docusate (SENOKOT-S) 8.6-50 MG tablet Take 2 tablets by mouth 2 (two) times daily. 12/26/15  Yes Courage Emokpae, MD  silodosin (RAPAFLO) 8 MG CAPS capsule Take 8 mg by mouth every other day.    Yes Historical Provider, MD  sucralfate (CARAFATE) 1 g tablet TAKE ONE TABLET BY MOUTH 4 TIMES DAILY BEFORE MEALS AND AT BEDTIME FOR HEARTBURN 01/06/16  Yes [name], MD    Family History Family History  Problem Relation Age of Onset  . Colon cancer Mother 71  . Heart disease Father 20    heart attack    Social History Social History  Substance Use Topics  . Smoking status: Never Smoker  . Smokeless tobacco: Never Used  . Alcohol use No     Allergies   Ace inhibitors; Aspartame; Atorvastatin; Biaxin [clarithromycin]; Daliresp [roflumilast]; Erythromycin; and Levofloxacin   Review of Systems Review of Systems 10 Systems reviewed and are negative for acute change except as noted in the HPI.  Physical Exam Updated Vital Signs BP (!) 172/104 (BP Location: Right Arm)   Pulse 95   Temp 97.4  F (36.3 C) (Axillary)   Resp 19   SpO2 98%   Physical Exam  Constitutional: She is oriented to person, place, and time. She appears well-developed and well-nourished. No distress.  HENT:  Head: Normocephalic and atraumatic.  Eyes: Conjunctivae and EOM are normal. No scleral icterus.  Neck: Normal range of motion.  Cardiovascular: Normal rate, regular rhythm and normal heart sounds.   HR 84.  Pulmonary/Chest: Effort normal and breath sounds normal.  Abdominal: Soft. Bowel sounds are normal. She exhibits no distension. There is no tenderness. There is guarding.  Appendix and Gall Bladder removed. Generalized abdominal tenderness.  Voluntary guarding  Musculoskeletal: Normal range of motion.  Neurological: She is alert and oriented to person, place, and time.  Skin: Skin is warm and dry. Capillary refill takes 2 to 3 seconds.  Psychiatric: She has a normal mood and affect. Judgment normal.  Nursing note and vitals reviewed.    ED Treatments / Results  DIAGNOSTIC STUDIES:  Oxygen Saturation is 97% on RA, normal by my interpretation.    COORDINATION OF CARE:  2:02 AM Discussed treatment plan with pt at bedside which includes non-narcotic pain medication and pt agreed to plan.  Labs (all labs ordered are listed, but only abnormal results are displayed) Labs Reviewed  COMPREHENSIVE METABOLIC PANEL - Abnormal; Notable for the following:       Result Value   Glucose, Bld 166 (*)    Creatinine, Ser 1.02 (*)    GFR calc non Af Amer 51 (*)    GFR calc Af Amer 59 (*)    All other components within normal limits  CBC WITH DIFFERENTIAL/PLATELET - Abnormal; Notable for the following:    WBC 12.8 (*)    RDW 15.9 (*)    Neutro Abs 11.5 (*)    All other components within normal limits  LIPASE, BLOOD  MAGNESIUM  URINALYSIS, ROUTINE W REFLEX MICROSCOPIC (NOT AT [place])    EKG  EKG Interpretation None  Radiology Ct Abdomen Pelvis W Contrast  Result Date:  03/27/2016 CLINICAL DATA:  80 year old female with abdominal and pelvic pain with nausea and vomiting today. EXAM: CT ABDOMEN AND PELVIS WITH CONTRAST TECHNIQUE: Multidetector CT imaging of the abdomen and pelvis was performed using the standard protocol following bolus administration of intravenous contrast. CONTRAST:  132mL ISOVUE-300 IOPAMIDOL (ISOVUE-300) INJECTION 61% COMPARISON:  12/23/2015 CT FINDINGS: Lower chest: No acute abnormality.  Cardiomegaly again identified. Hepatobiliary: No significant hepatic abnormality. Patient is status post cholecystectomy. Fullness of the CBD which measures 12 mm is unchanged from 2015. Pancreas: No significant abnormality. Spleen: Unremarkable Adrenals/Urinary Tract: The kidneys, adrenal glands and bladder are unremarkable except for probable tiny renal cysts. Stomach/Bowel: Hiatal hernia repair changes again noted. There is no evidence of bowel obstruction or definite bowel wall thickening. Colonic diverticulosis noted without evidence of diverticulitis. Vascular/Lymphatic: Abdominal aortic atherosclerotic calcifications noted without aneurysm. Reproductive: Status post hysterectomy. No adnexal masses. Other:  No ascites, abscess or pneumoperitoneum. Musculoskeletal: No acute or suspicious abnormalities. L1 compression fracture, L2-L4 compression fractures and vertebral augmentation changes and posterior fusion from L1-2 L3 again noted. IMPRESSION: No evidence of acute abnormality. Cardiomegaly, colonic diverticulosis and hiatal hernia repair changes. Abdominal aortic atherosclerosis. Lumbar spine compression fractures and vertebral augmentation changes again noted. Electronically Signed   By: Margarette Canada M.D.   On: 03/27/2016 14:52   Dg Abd Acute W/chest  Result Date: 03/27/2016 CLINICAL DATA:  Abdominal pain and vomiting EXAM: DG ABDOMEN ACUTE W/ 1V CHEST COMPARISON:  Body CT 12/23/2015 FINDINGS: Normal heart size and mediastinal contours. No acute infiltrate or  edema. No effusion or pneumothorax. No acute osseous findings. Nonobstructive bowel gas pattern. No concerning mass effect or gas collection. No evidence of renal calculus. Lumbar spine fixation and multilevel vertebroplasty. No acute osseous finding. Cholecystectomy clips. IMPRESSION: No evidence of acute disease in the chest or abdomen. Electronically Signed   By: Monte Fantasia M.D.   On: 03/27/2016 12:06    Procedures Procedures (including critical care time)  Medications Ordered in ED Medications  dextrose 5 % and 0.9% NaCl 5-0.9 % bolus 1,000 mL (1,000 mLs Intravenous New Bag/Given 03/28/16 0348)  magnesium oxide (MAG-OX) tablet 400 mg (not administered)  metoprolol tartrate (LOPRESSOR) tablet 50 mg (not administered)  gabapentin (NEURONTIN) capsule 300 mg (not administered)  ALPRAZolam (XANAX) tablet 0.5 mg (not administered)  oxyCODONE (Oxy IR/ROXICODONE) immediate release tablet 5 mg (not administered)  oxyCODONE (OXYCONTIN) 12 hr tablet 10 mg (not administered)  polyethylene glycol (MIRALAX / GLYCOLAX) packet 17 g (not administered)  losartan (COZAAR) tablet 100 mg (not administered)  pantoprazole (PROTONIX) EC tablet 40 mg (not administered)  amLODipine (NORVASC) tablet 10 mg (not administered)  albuterol (PROVENTIL HFA;VENTOLIN HFA) 108 (90 Base) MCG/ACT inhaler 1-2 puff (not administered)  mometasone-formoterol (DULERA) 200-5 MCG/ACT inhaler 2 puff (not administered)  enoxaparin (LOVENOX) injection 40 mg (not administered)  ondansetron (ZOFRAN) injection 4 mg (not administered)  HYDROmorphone (DILAUDID) injection 0.5 mg (not administered)  insulin aspart (novoLOG) injection 0-15 Units (not administered)  0.9 %  sodium chloride infusion (not administered)  HYDROmorphone (DILAUDID) injection 1 mg (1 mg Intramuscular Given 03/28/16 0205)  ondansetron (ZOFRAN-ODT) disintegrating tablet 4 mg (4 mg Oral Given 03/28/16 0216)  sodium chloride 0.9 % bolus 1,000 mL (0 mLs Intravenous  Stopped 03/28/16 0500)  ondansetron (ZOFRAN-ODT) disintegrating tablet 4 mg (4 mg Oral Given 03/28/16 0604)  oxyCODONE (Oxy IR/ROXICODONE) immediate release tablet 5 mg (5 mg Oral Given 03/28/16 0604)     Initial  Impression / Assessment and Plan / ED Course  I have reviewed the triage vital signs and the nursing notes.  Pertinent labs & imaging results that were available during my care of the patient were reviewed by me and considered in my medical decision making (see chart for details).  Clinical Course as of Mar 28 728  [facility] Mar 28, 2016  0408 Pt feels better. PO challenge started.   [AN]  0728 PO challenge failed. We shall admit  [AN]    Clinical Course User Index [AN] [name], MD    80 y.o.femalewith a history of  hypertension, history of esophageal stricture, MGUS, CAD, depression, chronic back pain syndrome, GERD comes in with n/v/abd pain. Abd pain is not peritoneal, generalized. Associated >15 emesis - now just dry heaving and no diarrhea. Pt looks slightly dry right now.  Unfortunately, she likely is opoid dependent, and has some element of withdrawals right now as well. We will hydrate and reassess. CT abdomen pelvis in the morning was normal.  Final Clinical Impressions(s) / ED Diagnoses   Final diagnoses:  Generalized abdominal pain  Intractable vomiting with nausea, unspecified vomiting type  Gastroenteritis    New Prescriptions New Prescriptions   No medications on file     [name], MD 03/28/16 0234    [name], MD 03/28/16 [place], MD 03/28/16 [id number]

## 2016-03-28 NOTE — ED Notes (Signed)
cbg was 152 

## 2016-03-28 NOTE — ED Notes (Signed)
Pt states pain is 8/10 but states vomiting improved after medication.

## 2016-03-28 NOTE — ED Notes (Addendum)
Assisted pt with bedpan, was unable to get urine sample due to pt having a bm.

## 2016-03-28 NOTE — ED Notes (Signed)
Waiting for IV team. Unable to administer IV fluids until IV access gained.

## 2016-03-28 NOTE — ED Triage Notes (Signed)
Pt returns to the ED with continued severe abd pain and dry heaves.  Seen in ED Sunday afternoon for same.

## 2016-03-29 DIAGNOSIS — K5909 Other constipation: Secondary | ICD-10-CM | POA: Diagnosis not present

## 2016-03-29 DIAGNOSIS — G894 Chronic pain syndrome: Secondary | ICD-10-CM

## 2016-03-29 DIAGNOSIS — D72829 Elevated white blood cell count, unspecified: Secondary | ICD-10-CM

## 2016-03-29 DIAGNOSIS — R112 Nausea with vomiting, unspecified: Secondary | ICD-10-CM | POA: Diagnosis not present

## 2016-03-29 DIAGNOSIS — I10 Essential (primary) hypertension: Secondary | ICD-10-CM

## 2016-03-29 DIAGNOSIS — N179 Acute kidney failure, unspecified: Secondary | ICD-10-CM

## 2016-03-29 DIAGNOSIS — K219 Gastro-esophageal reflux disease without esophagitis: Secondary | ICD-10-CM

## 2016-03-29 LAB — URINALYSIS, ROUTINE W REFLEX MICROSCOPIC
BILIRUBIN URINE: NEGATIVE
GLUCOSE, UA: NEGATIVE mg/dL
HGB URINE DIPSTICK: NEGATIVE
KETONES UR: NEGATIVE mg/dL
LEUKOCYTES UA: NEGATIVE
Nitrite: NEGATIVE
PROTEIN: NEGATIVE mg/dL
Specific Gravity, Urine: 1.01 (ref 1.005–1.030)
pH: 7 (ref 5.0–8.0)

## 2016-03-29 LAB — CBC
HCT: 37.2 % (ref 36.0–46.0)
Hemoglobin: 12.1 g/dL (ref 12.0–15.0)
MCH: 28.9 pg (ref 26.0–34.0)
MCHC: 32.5 g/dL (ref 30.0–36.0)
MCV: 88.8 fL (ref 78.0–100.0)
PLATELETS: 289 10*3/uL (ref 150–400)
RBC: 4.19 MIL/uL (ref 3.87–5.11)
RDW: 16.3 % — AB (ref 11.5–15.5)
WBC: 6.8 10*3/uL (ref 4.0–10.5)

## 2016-03-29 LAB — BASIC METABOLIC PANEL
Anion gap: 7 (ref 5–15)
BUN: 8 mg/dL (ref 6–20)
CALCIUM: 8.5 mg/dL — AB (ref 8.9–10.3)
CO2: 24 mmol/L (ref 22–32)
CREATININE: 0.73 mg/dL (ref 0.44–1.00)
Chloride: 110 mmol/L (ref 101–111)
GFR calc Af Amer: >60 mL/min (ref >60)
GFR calc non Af Amer: >60 mL/min (ref >60)
GLUCOSE: 89 mg/dL (ref 65–99)
Potassium: 3.2 mmol/L — ABNORMAL LOW (ref 3.5–5.1)
Sodium: 141 mmol/L (ref 135–145)

## 2016-03-29 MED ORDER — METOPROLOL SUCCINATE ER 25 MG PO TB24
25.0000 mg | ORAL_TABLET | Freq: Every day | ORAL | Status: DC
  Administered 2016-03-29: 25 mg via ORAL
  Filled 2016-03-29: qty 1

## 2016-03-29 MED ORDER — POTASSIUM CHLORIDE CRYS ER 20 MEQ PO TBCR
40.0000 meq | EXTENDED_RELEASE_TABLET | Freq: Once | ORAL | Status: AC
  Administered 2016-03-29: 40 meq via ORAL
  Filled 2016-03-29: qty 2

## 2016-03-29 NOTE — Discharge Instructions (Signed)
Nausea, Adult  Nausea is the feeling of an upset stomach or having to vomit. Nausea on its own is not usually a serious concern, but it may be an early sign of a more serious medical problem. As nausea gets worse, it can lead to vomiting. If vomiting develops, or if you are not able to drink enough fluids, you are at risk of becoming dehydrated. Dehydration can make you tired and thirsty, cause you to have a dry mouth, and decrease how often you urinate. Older adults and people with other diseases or a weak immune system are at higher risk for dehydration. The main goals of treating your nausea are:  · To limit repeated nausea episodes.  · To prevent vomiting and dehydration.    Follow these instructions at home:  Follow instructions from your health care provider about how to care for yourself at home.  Eating and drinking   Follow these recommendations as told by your health care provider:  · Take an oral rehydration solution (ORS). This is a drink that is sold at pharmacies and retail stores.  · Drink clear fluids in small amounts as you are able. Clear fluids include water, ice chips, diluted fruit juice, and low-calorie sports drinks.  · Eat bland, easy-to-digest foods in small amounts as you are able. These foods include bananas, applesauce, rice, lean meats, toast, and crackers.  · Avoid drinking fluids that contain a lot of sugar or caffeine, such as energy drinks, sports drinks, and soda.  · Avoid alcohol.  · Avoid spicy or fatty foods.    General instructions   · Drink enough fluid to keep your urine clear or pale yellow.  · Wash your hands often. If soap and water are not available, use hand sanitizer.  · Make sure that all people in your household wash their hands well and often.  · Rest at home while you recover.  · Take over-the-counter and prescription medicines only as told by your health care provider.  · Breathe slowly and deeply when you feel nauseous.  · Watch your condition for any  changes.  · Keep all follow-up visits as told by your health care provider. This is important.  Contact a health care provider if:  · You have a headache.  · You have new symptoms.  · Your nausea gets worse.  · You have a fever.  · You feel light-headed or dizzy.  · You vomit.  · You cannot keep fluids down.  Get help right away if:  · You have pain in your chest, neck, arm, or jaw.  · You feel extremely weak or you faint.  · You have vomit that is bright red or looks like coffee grounds.  · You have bloody or black stools or stools that look like tar.  · You have a severe headache, a stiff neck, or both.  · You have severe pain, cramping, or bloating in your abdomen.  · You have a rash.  · You have difficulty breathing or are breathing very quickly.  · Your heart is beating very quickly.  · Your skin feels cold and clammy.  · You feel confused.  · You have pain when you urinate.  · You have signs of dehydration, such as:  ? Dark urine, very little, or no urine.  ? Cracked lips.  ? Dry mouth.  ? Sunken eyes.  ? Sleepiness.  ? Weakness.  These symptoms may represent a serious problem that is an emergency. Do   not wait to see if the symptoms will go away. Get medical help right away. Call your local emergency services (911 in the [place] Do not drive yourself to the hospital.  This information is not intended to replace advice given to you by your health care provider. Make sure you discuss any questions you have with your health care provider.  Document Released: 06/02/2004 Document Revised: 09/28/2015 Document Reviewed: 12/30/2014  Elsevier Interactive Patient Education © 2017 [employer]

## 2016-03-29 NOTE — Progress Notes (Signed)
[name] [name] to be D/C'd Home per MD order.  Discussed with the patient and all questions fully answered.  VSS, Skin clean, dry and intact without evidence of skin break down, no evidence of skin tears noted. IV catheter discontinued intact. Site without signs and symptoms of complications. Dressing and pressure applied.  An After Visit Summary was printed and given to the patient. Patient received prescription information.   D/c education completed with patient/family including follow up instructions, medication list, d/c activities limitations if indicated, with other d/c instructions as indicated by MD - patient able to verbalize understanding, all questions fully answered.   Patient instructed to return to ED, call 911, or call MD for any changes in condition.   Patient escorted via [place], and D/C home via private auto.  [name] [name] 03/29/2016 4:39 PM

## 2016-03-29 NOTE — Discharge Summary (Signed)
Physician Discharge Summary  [name] [name] [id number] DOB: January 09, 1936 DOA: 03/28/2016  PCP: Alesia Richards, MD  Admit date: 03/28/2016 Discharge date: 03/29/2016  Time spent: 45 minutes  Recommendations for Outpatient Follow-up:  Patient will be discharged to home.  Patient will need to follow up with primary care provider within one week of discharge.  Follow up with gastroenterologist.  Patient should continue medications as prescribed.  Patient should follow a soft diet, advance to heart healthy diet as tolerated.   Discharge Diagnoses:  Acute on chronic intractable nausea and vomiting Acute kidney injury Essential hypertension Leukocytosis Diabetes mellitus, type II  Discharge Condition: Stable  Diet recommendation: soft diet  There were no vitals filed for this visit.  History of present illness:  On 03/28/2016 by Ms. Dyanne Carrel, NP [name] [name] is a 80 y.o. female with medical history significant for esophageal strictures, prior reflux, fundoplication, long-standing intermittent nausea/vomiting/dysphasia for which she is followed bile about our gastroenterology, history of esophageal candidiasis, hypertension, chronic pain syndrome since to the emergency department with the chief complaint of persistent intractable nausea and vomiting.  Information is obtained from the patient. She reports sudden onset abdominal pain starting yesterday morning. She states the pain is located intermittent on upper abdominal quadrants and she has chronic abdominal pain states this is the same as that. Associated symptoms include persistent nausea vomiting. Vomiting makes the abdominal pain worse. She reports having more than 15 episodes of vomiting. Initially it was undigested food and now is dry heaving. She denies any coffee ground emesis. He denies headache fever chills shortness of breath chest pain palpitations. She denies dysuria hematuria frequency or urgency. She denies any  diarrhea constipation melena bright red blood per rectum. She states she came to the emergency department yesterday was given pain medicine and antinausea medication with moderate relief and discharged. And symptoms didn't improve she came back. She also reports chronic back pain for which she takes medication and has not taken any in 2 days.  Hospital Course:  Acute on chronic Intractable nausea and vomiting -Currently improving, patient placed on soft diet and able to tolerate well -Patient has had a long-standing history of this and is followed by [facility].  -Per chart review, patient had multiple studies and procedures including fundoplication -CT abdomen and pelvis unremarkable -lipase within normal limits -Patient did have EGD 3 months ago showing chronic symptoms appear to be due to fundoplication and perhaps age-related decreased esophageal motility. No lesion amenable to dilation -Continue antiemetics as needed -Follow-up with gastroenterology as an outpatient -Continue antiemetics, sucralfate, pain control upon discharge  Acute kidney injury -Resolved, likely secondary to the above -Upon admission, creatinine 1.03 baseline appears to be 0.7  Essential hypertension -Continue her medications, amlodipine, metoprolol, Cozaar  Leukocytosis -Resolved, likely reactive -UA unremarkable -Abdominal/chest x-ray unremarkable for infection  Diabetes mellitus, type II -Metformin held on admission, continue upon discharge -Hemoglobin A1c 5.5 12/23/2015  Procedures:  none  Consultations:  none  Discharge Exam: Vitals:   03/29/16 1046 03/29/16 1349  BP: 117/70 138/66  Pulse: 91 64  Resp:  16  Temp:  98.4 F (36.9 C)   She denies any further nausea or dry heaving. Denies any abdominal pain. Denies any chest pain, shortness of breath, dizziness, headache, and diarrhea or constipation. Patient does wish to go home today.   General: Well developed, well nourished, NAD,  appears stated age  HEENT: NCAT,  mucous membranes moist.  Cardiovascular: S1 S2 auscultated, no rubs, murmurs or  gallops. Regular rate and rhythm.  Respiratory: Clear to auscultation bilaterally with equal chest rise  Abdomen: Soft, nontender, nondistended, + bowel sounds  Extremities: warm dry without cyanosis clubbing or edema  Neuro: AAOx3, nonfocal  Psych: Normal affect and demeanor with intact judgement and insight  Discharge Instructions Discharge Instructions    Discharge instructions    Complete by:  As directed    Patient will be discharged to home.  Patient will need to follow up with primary care provider within one week of discharge.  Follow up with gastroenterologist.  Patient should continue medications as prescribed.  Patient should follow a soft diet, advance to heart healthy diet as tolerated.     Current Discharge Medication List    CONTINUE these medications which have NOT CHANGED   Details  acetaminophen (TYLENOL) 325 MG tablet Take 2 tablets (650 mg total) by mouth every 6 (six) hours as needed for mild pain (or Fever >/= 101). Qty: 20 tablet, Refills: 0    albuterol (PROVENTIL HFA;VENTOLIN HFA) 108 (90 BASE) MCG/ACT inhaler Inhale 1-2 puffs into the lungs every 6 (six) hours as needed for wheezing or shortness of breath.    ALPRAZolam (XANAX) 0.5 MG tablet TAKE ONE-HALF TO ONE TABLET BY MOUTH TWO TO THREE TIMES DAILY FOR NERVES Qty: 90 tablet, Refills: 5   Associated Diagnoses: Nerves    amLODipine (NORVASC) 10 MG tablet Take 1 tablet (10 mg total) by mouth at bedtime. Qty: 30 tablet, Refills: 1    Cholecalciferol (VITAMIN D3) 2000 UNITS TABS Take 6,000 Units by mouth daily.     gabapentin (NEURONTIN) 300 MG capsule Take 1 capsule (300 mg total) by mouth 3 (three) times daily. Qty: 90 capsule, Refills: 2    ipratropium-albuterol (DUONEB) 0.5-2.5 (3) MG/3ML SOLN USE ONE VIAL IN NEBULIZER 4 TIMES DAILY Qty: 360 mL, Refills: 99    losartan (COZAAR)  100 MG tablet TAKE ONE TABLET BY MOUTH ONCE DAILY ( DISCONTINUE BENICAR) Qty: 90 tablet, Refills: 1    magnesium oxide (MAG-OX) 400 MG tablet Take 400 mg by mouth daily.    metFORMIN (GLUCOPHAGE-XR) 500 MG 24 hr tablet TAKE ONE TO TWO TABLETS BY MOUTH TWICE DAILY WITH MEALS FOR DIABETES Qty: 360 tablet, Refills: 1    metoprolol (LOPRESSOR) 50 MG tablet TAKE ONE TABLET BY MOUTH TWICE DAILY FOR BLOOD PRESSURE Qty: 180 tablet, Refills: 1    mometasone-formoterol (DULERA) 200-5 MCG/ACT AERO Inhale 2 puffs into the lungs 2 (two) times daily. Qty: 13 g, Refills: 3    nitroGLYCERIN (NITROSTAT) 0.4 MG SL tablet Place 1 tablet (0.4 mg total) under the tongue every 5 (five) minutes as needed for chest pain. Qty: 90 tablet, Refills: 3   Associated Diagnoses: Shortness of breath    orphenadrine (NORFLEX) 100 MG tablet Take 1 tablet (100 mg total) by mouth 2 (two) times daily as needed for muscle spasms. Qty: 40 tablet, Refills: 0    OVER THE COUNTER MEDICATION Take 1,000 mg by mouth 2 (two) times daily. Coral Calcium 1000mg     oxyCODONE (OXY IR/ROXICODONE) 5 MG immediate release tablet Take 1 tablet (5 mg total) by mouth every 4 (four) hours as needed for severe pain or breakthrough pain. Qty: 15 tablet, Refills: 0    oxyCODONE (OXYCONTIN) 10 mg 12 hr tablet Take 1 tablet (10 mg total) by mouth every 12 (twelve) hours. Qty: 15 tablet, Refills: 0    pantoprazole (PROTONIX) 40 MG tablet Take 1 tablet (40 mg total) by mouth daily. Qty: 90  tablet, Refills: 2    polyethylene glycol (MIRALAX / GLYCOLAX) packet Take 17 g by mouth 2 (two) times daily. Qty: 72 each, Refills: 1    potassium chloride SA (K-DUR,KLOR-CON) 20 MEQ tablet Take 1 tablet (20 mEq total) by mouth 2 (two) times daily. Qty: 30 tablet, Refills: 0   Associated Diagnoses: Hypokalemia    pravastatin (PRAVACHOL) 40 MG tablet Take 1 tablet (40 mg total) by mouth daily. Qty: 90 tablet, Refills: 1    promethazine (PHENERGAN) 25 MG  tablet Take 1 tablet (25 mg total) by mouth every 6 (six) hours as needed for nausea or vomiting. Qty: 20 tablet, Refills: 0    senna-docusate (SENOKOT-S) 8.6-50 MG tablet Take 2 tablets by mouth 2 (two) times daily. Qty: 60 tablet, Refills: 1    silodosin (RAPAFLO) 8 MG CAPS capsule Take 8 mg by mouth every other day.     sucralfate (CARAFATE) 1 g tablet TAKE ONE TABLET BY MOUTH 4 TIMES DAILY BEFORE MEALS AND AT BEDTIME FOR HEARTBURN Qty: 360 tablet, Refills: 1       Allergies  Allergen Reactions  . Ace Inhibitors Cough    Per Dr Melvyn Novas pulmonology 2013  . Aspartame Diarrhea and Nausea And Vomiting  . Atorvastatin Other (See Comments)    Muscle aches  . Biaxin [Clarithromycin] Other (See Comments)    PATIENT CAN TOLERATE Z-PAK.  Is written on patient's paper chart.  [name] [Roflumilast] Nausea And Vomiting  . Erythromycin Other (See Comments)    PATIENT CAN TOLERATE Z-PAK.  It is written on patient's paper chart.  . Levofloxacin Nausea And Vomiting   Follow-up Information    MCKEOWN,WILLIAM DAVID, MD. Schedule an appointment as soon as possible for a visit in 1 week(s).   Specialty:  Internal Medicine Why:  Hospital follow up Contact information: [address] [place] [address] [phone]        [facility]. Schedule an appointment as soon as possible for a visit.   Specialty:  Gastroenterology Why:  As needed for nausea.  Contact information: [address] [place] [phone]           The results of significant diagnostics from this hospitalization (including imaging, microbiology, ancillary and laboratory) are listed below for reference.    Significant Diagnostic Studies: Ct Abdomen Pelvis W Contrast  Result Date: 03/27/2016 CLINICAL DATA:  80 year old female with abdominal and pelvic pain with nausea and vomiting today. EXAM: CT ABDOMEN AND PELVIS WITH CONTRAST TECHNIQUE:  Multidetector CT imaging of the abdomen and pelvis was performed using the standard protocol following bolus administration of intravenous contrast. CONTRAST:  134mL ISOVUE-300 IOPAMIDOL (ISOVUE-300) INJECTION 61% COMPARISON:  12/23/2015 CT FINDINGS: Lower chest: No acute abnormality.  Cardiomegaly again identified. Hepatobiliary: No significant hepatic abnormality. Patient is status post cholecystectomy. Fullness of the CBD which measures 12 mm is unchanged from 2015. Pancreas: No significant abnormality. Spleen: Unremarkable Adrenals/Urinary Tract: The kidneys, adrenal glands and bladder are unremarkable except for probable tiny renal cysts. Stomach/Bowel: Hiatal hernia repair changes again noted. There is no evidence of bowel obstruction or definite bowel wall thickening. Colonic diverticulosis noted without evidence of diverticulitis. Vascular/Lymphatic: Abdominal aortic atherosclerotic calcifications noted without aneurysm. Reproductive: Status post hysterectomy. No adnexal masses. Other:  No ascites, abscess or pneumoperitoneum. Musculoskeletal: No acute or suspicious abnormalities. L1 compression fracture, L2-L4 compression fractures and vertebral augmentation changes and posterior fusion from L1-2 L3 again noted. IMPRESSION: No evidence of acute abnormality. Cardiomegaly, colonic diverticulosis and  hiatal hernia repair changes. Abdominal aortic atherosclerosis. Lumbar spine compression fractures and vertebral augmentation changes again noted. Electronically Signed   By: Margarette Canada M.D.   On: 03/27/2016 14:52   Dg Abd Acute W/chest  Result Date: 03/27/2016 CLINICAL DATA:  Abdominal pain and vomiting EXAM: DG ABDOMEN ACUTE W/ 1V CHEST COMPARISON:  Body CT 12/23/2015 FINDINGS: Normal heart size and mediastinal contours. No acute infiltrate or edema. No effusion or pneumothorax. No acute osseous findings. Nonobstructive bowel gas pattern. No concerning mass effect or gas collection. No evidence of renal  calculus. Lumbar spine fixation and multilevel vertebroplasty. No acute osseous finding. Cholecystectomy clips. IMPRESSION: No evidence of acute disease in the chest or abdomen. Electronically Signed   By: Monte Fantasia M.D.   On: 03/27/2016 12:06    Microbiology: No results found for this or any previous visit (from the past 240 hour(s)).   Labs: Basic Metabolic Panel:  Recent Labs Lab 03/27/16 0948 03/28/16 0254 03/29/16 0611  NA 138 138 141  K 5.0 3.6 3.2*  CL 104 103 110  CO2 22 25 24   GLUCOSE 158* 166* 89  BUN 12 12 8   CREATININE 1.00 1.02* 0.73  CALCIUM 10.2 9.4 8.5*  MG  --  2.0  --    Liver Function Tests:  Recent Labs Lab 03/27/16 0948 03/28/16 0254  AST 32 25  24  ALT 17 13*  16  ALKPHOS 68 62  65  BILITOT 0.8 0.9  0.9  PROT 7.8 7.6  7.7  ALBUMIN 4.4 4.4  4.4    Recent Labs Lab 03/27/16 0948 03/28/16 0254  LIPASE 17 18   No results for input(s): AMMONIA in the last 168 hours. CBC:  Recent Labs Lab 03/27/16 0948 03/28/16 0254 03/29/16 0611  WBC 7.5 12.8* 6.8  NEUTROABS  --  11.5*  --   HGB 14.9 14.4 12.1  HCT 45.1 43.4 37.2  MCV 87.7 88.0 88.8  PLT 371 333 289   Cardiac Enzymes: No results for input(s): CKTOTAL, CKMB, CKMBINDEX, TROPONINI in the last 168 hours. BNP: BNP (last 3 results) No results for input(s): BNP in the last 8760 hours.  ProBNP (last 3 results) No results for input(s): PROBNP in the last 8760 hours.  CBG:  Recent Labs Lab 03/28/16 0815  GLUCAP 152*       Signed:  Cristal Ford  [facility] 03/29/2016, 2:21 PM

## 2016-03-30 DIAGNOSIS — M961 Postlaminectomy syndrome, not elsewhere classified: Secondary | ICD-10-CM | POA: Diagnosis not present

## 2016-03-30 DIAGNOSIS — M4726 Other spondylosis with radiculopathy, lumbar region: Secondary | ICD-10-CM | POA: Diagnosis not present

## 2016-03-30 DIAGNOSIS — Z79891 Long term (current) use of opiate analgesic: Secondary | ICD-10-CM | POA: Diagnosis not present

## 2016-03-30 DIAGNOSIS — G894 Chronic pain syndrome: Secondary | ICD-10-CM | POA: Diagnosis not present

## 2016-04-05 ENCOUNTER — Ambulatory Visit (INDEPENDENT_AMBULATORY_CARE_PROVIDER_SITE_OTHER): Payer: PPO | Admitting: Internal Medicine

## 2016-04-05 ENCOUNTER — Other Ambulatory Visit: Payer: Self-pay | Admitting: *Deleted

## 2016-04-05 ENCOUNTER — Encounter: Payer: Self-pay | Admitting: Internal Medicine

## 2016-04-05 VITALS — BP 120/62 | HR 76 | Temp 97.4°F | Resp 16 | Ht 63.0 in | Wt 137.4 lb

## 2016-04-05 DIAGNOSIS — E876 Hypokalemia: Secondary | ICD-10-CM | POA: Diagnosis not present

## 2016-04-05 DIAGNOSIS — R7303 Prediabetes: Secondary | ICD-10-CM

## 2016-04-05 DIAGNOSIS — R109 Unspecified abdominal pain: Secondary | ICD-10-CM

## 2016-04-05 DIAGNOSIS — E860 Dehydration: Secondary | ICD-10-CM

## 2016-04-05 DIAGNOSIS — Z79899 Other long term (current) drug therapy: Secondary | ICD-10-CM | POA: Diagnosis not present

## 2016-04-05 DIAGNOSIS — R1115 Cyclical vomiting syndrome unrelated to migraine: Secondary | ICD-10-CM

## 2016-04-05 DIAGNOSIS — G43A1 Cyclical vomiting, intractable: Secondary | ICD-10-CM

## 2016-04-05 LAB — BASIC METABOLIC PANEL WITH GFR
BUN: 16 mg/dL (ref 7–25)
CALCIUM: 9.9 mg/dL (ref 8.6–10.4)
CO2: 27 mmol/L (ref 20–31)
CREATININE: 0.95 mg/dL — AB (ref 0.60–0.93)
Chloride: 102 mmol/L (ref 98–110)
GFR, EST AFRICAN AMERICAN: 66 mL/min (ref >=60)
GFR, EST NON AFRICAN AMERICAN: 58 mL/min — AB (ref >=60)
GLUCOSE: 122 mg/dL — AB (ref 65–99)
Potassium: 4.5 mmol/L (ref 3.5–5.3)
SODIUM: 138 mmol/L (ref 135–146)

## 2016-04-05 LAB — CBC WITH DIFFERENTIAL/PLATELET
BASOS ABS: 73 {cells}/uL (ref 0–200)
Basophils Relative: 1 %
EOS PCT: 2 %
Eosinophils Absolute: 146 cells/uL (ref 15–500)
HCT: 42.8 % (ref 35.0–45.0)
HEMOGLOBIN: 13.7 g/dL (ref 11.7–15.5)
LYMPHS PCT: 20 %
Lymphs Abs: 1460 cells/uL (ref 850–3900)
MCH: 28.8 pg (ref 27.0–33.0)
MCHC: 32 g/dL (ref 32.0–36.0)
MCV: 89.9 fL (ref 80.0–100.0)
MPV: 10.3 fL (ref 7.5–12.5)
Monocytes Absolute: 657 cells/uL (ref 200–950)
Monocytes Relative: 9 %
NEUTROS PCT: 68 %
Neutro Abs: 4964 cells/uL (ref 1500–7800)
Platelets: 362 10*3/uL (ref 140–400)
RBC: 4.76 MIL/uL (ref 3.80–5.10)
RDW: 16.6 % — AB (ref 11.0–15.0)
WBC: 7.3 10*3/uL (ref 3.8–10.8)

## 2016-04-05 MED ORDER — PROMETHAZINE HCL 25 MG PO TABS: 25.0000 mg | ORAL_TABLET | Freq: Four times a day (QID) | ORAL | 0 refills | Status: DC | PRN

## 2016-04-05 NOTE — Progress Notes (Signed)
[facility] [name], M.D.        Uvaldo Bristle. Silverio Lay, P.A.-C       Starlyn Skeans, P.A.-C  [facility]                [address] [place], [place] [id number] Telephone [phone] Telefax [phone] ______________________________________________________________________     This very nice   MWF presents for hospital follow up after an ER visit om 03/27/2016 for severe N/V and had a neg Abd/Pelvic CT scan and was releasesed and then the next day after 5 hpiurs of persistent N/V and "dry heaving" she was admitted 211/20-21 , and given IVF for dehydration. WBC was elevated, but no infection was id'd. K+ was low - 3.2 at discharge. After return home 1 week ago, patient has advanced back to a regular diet.  Patient has hx/o laparoscopic repair of HH  Nissen fundoplication x 2 in Jan 0000000 and Dec 2014 during which she also has lysis of adhesions. Diet reviewed today reveals patient is indiscrete consuming a number of triggers for reflux ! Patient is also followed for  HTN, HLD, T2_DM and Vitamin D Deficiency.      Patient is treated for HTN (1986)  & BP has been controlled at home. Today's BP is  120/62. Patient has had no complaints of any cardiac type chest pain, palpitations, dyspnea/orthopnea/PND, dizziness, claudication, or dependent edema.     Hyperlipidemia is controlled with diet & meds. Patient denies myalgias or other med SE's. Last Lipids were at goal: Lab Results  Component Value Date   CHOL 122 (L) 01/14/2016   HDL 68 01/14/2016   LDLCALC 35 01/14/2016   TRIG 93 01/14/2016   CHOLHDL 1.8 01/14/2016      Also, the patient has history of T2_NIDDM and was started on Metformin in May 2015  and has had no symptoms of reactive hypoglycemia, diabetic polys, paresthesias or visual blurring.  Last A1c was at goal: Lab Results  Component Value Date   HGBA1C 5.5 12/23/2015      Further, the patient  also has history of Vitamin D Deficiency and supplements vitamin D without any suspected side-effects. Last vitamin D was at goal:  Lab Results  Component Value Date   VD25OH 64 01/14/2016   Current Outpatient Prescriptions on File Prior to Visit  Medication Sig  . TYLENOL 325 MG tablet Take 2 tab every 6  hours as needed  . albuterol  HFA  inhaler 1-2 puffs every 6  hours as needed  . ALPRAZolam  0.5 MG tablet TAKE 1/2-1 tab  2-3 x/day   . amLODipine  10 MG tablet Take 1 tab at bedtime.   [name] VITAMIN D 2000 UNITS TABS Take 6,000 Units by mouth daily.   [name] gabapentin  300 MG capsule Take 1 capsule (300 mg total) by mouth 3 (three) times daily.  . DUONEB 0.5-2.5MG /3ML USE ONE VIAL IN NEBULIZER 4 TIMES DAILY  . losartan100 MG tablet TAKE ONE TABLET BY MOUTH ONCE DAILY   . magnesium oxide  400 MG Take 400 mg by mouth daily.  . metFORMIN-XR 500 MG TAKE ONE TO TWO TABLETS BY MOUTH TWICE DAILY   . metoprolol  50 MG tablet TAKE ONE TABLET BY MOUTH TWICE DAILY FOR BLOOD PRESSURE  . DULERA 200-5  AERO Inhale 2 puffs into the lungs 2 (two) times daily.   [name]  NITROSTAT 0.4 MG SL  as needed for chest pain.  . orphenadrine  100 MG tablet Take 1 tab 2 x daily as needed for muscle spasms.  .  Coral Calcium 1000mg  Take 1,000 mg by mouth 2 (two) times daily.  [name] oxyCODONE5 MG IR  Take  every 6  hrs as needed for severe pain  . pantoprazole  40 MG tablet Take 1 tablet (40 mg total) by mouth daily.  [name] MIRALAX  Take 17 g by mouth 2 (two) times daily.  [name] K-DUR 20 MEQ tablet Take 1 tablet (20 mEq total) by mouth 2 (two) times daily.  . pravastatin  40 MG tablet Take 1 tablet (40 mg total) by mouth daily.  . promethazine  25 MG tablet Take 1 tab every 6 hours as needed for nausea or vomiting.  Nance Pew Take 2 tab  2 (two) times daily.  [name] RAPAFLO 8 MG CAPS c Take 8 mg by mouth every other day.   [name] CARAFATE 1 g tablet TAKE ONE TABLET BY MOUTH 4 TIMES DAILY    Allergies  Allergen Reactions  . Ace Inhibitors  Cough    Per Dr Melvyn Novas pulmonology 2013  . Aspartame Diarrhea and Nausea And Vomiting  . Atorvastatin Other (See Comments)    Muscle aches  . Biaxin [Clarithromycin] Other (See Comments)    PATIENT CAN TOLERATE Z-PAK.  Is written on patient's paper chart.  [name] [Roflumilast] Nausea And Vomiting  . Erythromycin Other (See Comments)    PATIENT CAN TOLERATE Z-PAK.  It is written on patient's paper chart.  . Levofloxacin Nausea And Vomiting   PMHx:   Past Medical History:  Diagnosis Date  . Anxiety    takes Xanax daily as needed  . Asthma    Albuterol daily as needed  . CAD (coronary artery disease)    LHC 2003 with 40% pLAD, 30% mLAD, 100% D1 (moderate vessel), 100%  D2 (small vessel), 95% mid small OM2.  Pt had PTCA to D1;  D2 and OM2 small vessels and tx medically;  Last Myoview (2012): anterior infarct seen with scar, no ischemia. EF normal. Pt managed medically;  Lexiscan Myoview (12/14):  Low risk; ant scar with very mild peri-infarct ischemia; EF 55% with ant AK   . Chronic back pain    HNP, DDD, spinal stenosis, vertebral compression fractures.   . Chronic nausea    takes Zofran daily as needed.  normal gastric emptying study 03/2013  . Constipation    felt to be functional. takes Senokot and Miralax daily.  treated with Linzess in past.   . DIVERTICULOSIS, COLON 08/28/2008   Qualifier: Diagnosis of  By: [name], [name], [name]    . Elevated LFTs 2015   probably med induced DILI, from Augmentin vs Pravastatin  . GERD (gastroesophageal reflux disease)    hx of esophageal stricture   . Hiatal hernia    Paraesophageal hernias. s/p fundoplications in 0000000 and 04/2013  . History of bronchitis several of yrs ago  . History of colon polyps 03/2009, 03/2011   adenomatous colon polyps, no high grade dysplasia.   [name] History of vertigo    no meds  . Hyperlipidemia    takes Pravastatin daily  . Hypertension    takes Amlodipine,Metoprolol,and Losartan daily  . Nausea 03/2016  .  Osteoporosis   . PONV (postoperative nausea and vomiting)    yrs ago  . Slow urinary stream    takes Rapaflo daily  . TIA (transient ischemic  attack) 1995   no residual effects noted   Immunization History  Administered Date(s) Administered  . DT 05/06/2014  . Influenza Split 02/07/2011  . Influenza Whole 02/09/2009, 01/19/2010, 02/13/2012  . Influenza, High Dose Seasonal PF 03/22/2013, 01/15/2014, 02/27/2015, 01/14/2016  . Pneumococcal Conjugate-13 05/06/2014  . Pneumococcal Polysaccharide-23 02/09/2011  . Pneumococcal-Unspecified 02/06/2010, 02/09/2012  . Td 05/10/2003   Past Surgical History:  Procedure Laterality Date  . APPENDECTOMY     patient unsure of date  . BACK SURGERY    . BLADDER REPAIR    . CHOLECYSTECTOMY     Patient unsure of date.  . CORONARY ANGIOPLASTY  11/16/2001   1 stent  . ESOPHAGEAL MANOMETRY N/A 03/25/2013   Procedure: ESOPHAGEAL MANOMETRY (EM);  Surgeon: Inda Castle, MD;  [facility];  Service: Endoscopy;  Laterality: N/A;  . ESOPHAGOGASTRODUODENOSCOPY N/A 03/15/2013   Procedure: ESOPHAGOGASTRODUODENOSCOPY (EGD);  Surgeon: Jerene Bears, MD;  [facility] ENDOSCOPY;  Service: Endoscopy;  Laterality: N/A;  . ESOPHAGOGASTRODUODENOSCOPY N/A 12/25/2015   Procedure: ESOPHAGOGASTRODUODENOSCOPY (EGD);  Surgeon: Doran Stabler, MD;  Location: [facility] ENDOSCOPY;  Service: Endoscopy;  Laterality: N/A;  . EYE SURGERY  11/2000   bilateral cataracts with lens implant  . FIXATION KYPHOPLASTY LUMBAR SPINE  X 2   "L3-4"  . HEMORRHOID SURGERY    . HIATAL HERNIA REPAIR  06/03/2011   Procedure: LAPAROSCOPIC REPAIR OF HIATAL HERNIA;  Surgeon: Adin Hector, MD;  [facility];  Service: General;  Laterality: N/A;  . HIATAL HERNIA REPAIR N/A 04/24/2013   Procedure: LAPAROSCOPIC  REPAIR RECURRENT PARASOPHAGEAL HIATAL HERNIA WITH FUNDOPLICATION;  Surgeon: Adin Hector, MD;  Location: [facility];  Service: General;  Laterality: N/A;  . INSERTION OF MESH N/A  04/24/2013   Procedure: INSERTION OF MESH;  Surgeon: Adin Hector, MD;  Location: [facility];  Service: General;  Laterality: N/A;  . LAPAROSCOPIC LYSIS OF ADHESIONS N/A 04/24/2013   Procedure: LAPAROSCOPIC LYSIS OF ADHESIONS;  Surgeon: Adin Hector, MD;  Location: [facility];  Service: General;  Laterality: N/A;  . LAPAROSCOPIC NISSEN FUNDOPLICATION  [id number]   Procedure: LAPAROSCOPIC NISSEN FUNDOPLICATION;  Surgeon: Adin Hector, MD;  Location: [facility];  Service: General;  Laterality: N/A;  . LUMBAR FUSION  12/07/2015   Right L2-3 facetectomy, posterolateral fusion L2-3 with pedicle screws, rods, local bone graft, Vivigen cancellous chips. Fusion extended to the L1 level with pedicle screws and rods  . LUMBAR LAMINECTOMY/DECOMPRESSION MICRODISCECTOMY Right 06/26/2012   Procedure: LUMBAR LAMINECTOMY/DECOMPRESSION MICRODISCECTOMY;  Surgeon: Jessy Oto, MD;  Location: [facility];  Service: Orthopedics;  Laterality: Right;  RIGHT L4-5 MICRODISCECTOMY  . LUMBAR LAMINECTOMY/DECOMPRESSION MICRODISCECTOMY N/A 05/29/2015   Procedure: RIGHT L2-3 MICRODISCECTOMY;  Surgeon: Jessy Oto, MD;  Location: [facility];  Service: Orthopedics;  Laterality: N/A;  . TOTAL ABDOMINAL HYSTERECTOMY  1975   partial   FHx:    Reviewed / unchanged  SHx:    Reviewed / unchanged  Systems Review:  Constitutional: Denies fever, chills, wt changes, headaches, insomnia, fatigue, night sweats, change in appetite. Eyes: Denies redness, blurred vision, diplopia, discharge, itchy, watery eyes.  ENT: Denies discharge, congestion, post nasal drip, epistaxis, sore throat, earache, hearing loss, dental pain, tinnitus, vertigo, sinus pain, snoring.  CV: Denies chest pain, palpitations, irregular heartbeat, syncope, dyspnea, diaphoresis, orthopnea, PND, claudication or edema. Respiratory: denies cough, dyspnea, DOE, pleurisy, hoarseness, laryngitis, wheezing.  Gastrointestinal: Denies dysphagia, odynophagia, heartburn, reflux, water  brash, abdominal pain or cramps, nausea, vomiting, bloating, diarrhea, constipation,  hematemesis, melena, hematochezia  or hemorrhoids. Genitourinary: Denies dysuria, frequency, urgency, nocturia, hesitancy, discharge, hematuria or flank pain. Musculoskeletal: Denies arthralgias, myalgias, stiffness, jt. swelling, pain, limping or strain/sprain.  Skin: Denies pruritus, rash, hives, warts, acne, eczema or change in skin lesion(s). Neuro: No weakness, tremor, incoordination, spasms, paresthesia or pain. Psychiatric: Denies confusion, memory loss or sensory loss. Endo: Denies change in weight, skin or hair change.  Heme/Lymph: No excessive bleeding, bruising or enlarged lymph nodes.  Physical Exam BP 120/62   Pulse 76   Temp 97.4 F (36.3 C)   Resp 16   Ht 5\' 3"  (1.6 m)   Wt 137 lb 6.4 oz (62.3 kg)   BMI 24.34 kg/m   Appears well nourished and in no distress.  Eyes: PERRLA, EOMs, conjunctiva no swelling or erythema. Sinuses: No frontal/maxillary tenderness ENT/Mouth: EAC's clear, TM's nl w/o erythema, bulging. Nares clear w/o erythema, swelling, exudates. Oropharynx clear without erythema or exudates. Oral hygiene is good. Tongue normal, non obstructing. Hearing intact.  Neck: Supple. Thyroid nl. Car 2+/2+ without bruits, nodes or JVD. Chest: Respirations nl with BS clear & equal w/o rales, rhonchi, wheezing or stridor.  Cor: Heart sounds normal w/ regular rate and rhythm without sig. murmurs, gallops, clicks, or rubs. Peripheral pulses normal and equal  without edema.  Abdomen: Soft & bowel sounds normal. Non-tender w/o guarding, rebound, hernias, masses, or organomegaly.  Lymphatics: Unremarkable.  Musculoskeletal: Full ROM all peripheral extremities, joint stability, 5/5 strength, and normal gait.  Skin: Warm, dry without exposed rashes, lesions or ecchymosis apparent.  Neuro: Cranial nerves intact, reflexes equal bilaterally. Sensory-motor testing grossly intact. Tendon reflexes  grossly intact.  Pysch: Alert & oriented x 3.  Insight and judgement nl & appropriate. No ideations.  Assessment and Plan:  1. Intractable cyclical vomiting with nausea  - CBC with Differential/Platelet - BASIC METABOLIC PANEL WITH GFR - Continue medication, monitor blood pressure at home - Reminder to go to the ER if any CP, SOB, Abd pain, nausea.  2. Dehydration  - BASIC METABOLIC PANEL WITH GFR  3. Hypokalemia  - BASIC METABOLIC PANEL WITH GFR  4. Abdominal pain, unspecified abdominal location   5. Medication management  - CBC with Differential/Platelet - BASIC METABOLIC PANEL WITH GFR   Recommended regular exercise, BP monitoring, weight control, and discussed med and SE's. Recommended labs to assess and monitor clinical status. Further disposition pending results of labs. Over 40 minutes of exam, counseling, chart review was performed

## 2016-04-05 NOTE — Patient Instructions (Signed)
Abdominal Pain, Adult Many things can cause belly (abdominal) pain. Most times, belly pain is not dangerous. Many cases of belly pain can be watched and treated at home. Sometimes belly pain is serious, though. Your doctor will try to find the cause of your belly pain. Follow these instructions at home:  Take over-the-counter and prescription medicines only as told by your doctor. Do not take medicines that help you poop (laxatives) unless told to by your doctor.  Drink enough fluid to keep your pee (urine) clear or pale yellow.  Watch your belly pain for any changes.  Keep all follow-up visits as told by your doctor. This is important. Contact a doctor if:  Your belly pain changes or gets worse.  You are not hungry, or you lose weight without trying.  You are having trouble pooping (constipated) or have watery poop (diarrhea) for more than 2-3 days.  You have pain when you pee or poop.  Your belly pain wakes you up at night.  Your pain gets worse with meals, after eating, or with certain foods.  You are throwing up and cannot keep anything down.  You have a fever. Get help right away if:  Your pain does not go away as soon as your doctor says it should.  You cannot stop throwing up.  Your pain is only in areas of your belly, such as the right side or the left lower part of the belly.  You have bloody or black poop, or poop that looks like tar.  You have very bad pain, cramping, or bloating in your belly.  You have signs of not having enough fluid or water in your body (dehydration), such as: ? Dark pee, very little pee, or no pee. ? Cracked lips. ? Dry mouth. ? Sunken eyes. ? Sleepiness. ? Weakness. This information is not intended to replace advice given to you by your health care provider. Make sure you discuss any questions you have with your health care provider. Document Released: 10/12/2007 Document Revised: 11/13/2015 Document Reviewed: 10/07/2015 Elsevier  Interactive Patient Education  2017 Elsevier Inc.  

## 2016-04-06 ENCOUNTER — Other Ambulatory Visit: Payer: Self-pay | Admitting: Internal Medicine

## 2016-04-06 DIAGNOSIS — Z1231 Encounter for screening mammogram for malignant neoplasm of breast: Secondary | ICD-10-CM

## 2016-04-07 ENCOUNTER — Other Ambulatory Visit: Payer: Self-pay | Admitting: Internal Medicine

## 2016-04-07 ENCOUNTER — Ambulatory Visit (INDEPENDENT_AMBULATORY_CARE_PROVIDER_SITE_OTHER): Payer: PPO | Admitting: Specialist

## 2016-04-07 ENCOUNTER — Telehealth: Payer: Self-pay | Admitting: Internal Medicine

## 2016-04-07 DIAGNOSIS — N644 Mastodynia: Secondary | ICD-10-CM

## 2016-04-07 NOTE — Telephone Encounter (Signed)
Patient called [facility] to schedule screening mammogram. C/O right nipple pain. Advised patient to contact PCP to order diagnostic mammogram.  Please order Bilateral Diagnostic Tomo [id number] and [place] Right [id number].   Advised patient order entered, contact [facility] to schedule, [phone].

## 2016-04-13 ENCOUNTER — Encounter (INDEPENDENT_AMBULATORY_CARE_PROVIDER_SITE_OTHER): Payer: Self-pay | Admitting: Specialist

## 2016-04-13 ENCOUNTER — Ambulatory Visit (INDEPENDENT_AMBULATORY_CARE_PROVIDER_SITE_OTHER): Payer: PPO | Admitting: Specialist

## 2016-04-13 VITALS — BP 146/72 | HR 73 | Ht 65.0 in | Wt 138.0 lb

## 2016-04-13 DIAGNOSIS — K5909 Other constipation: Secondary | ICD-10-CM

## 2016-04-13 DIAGNOSIS — M4316 Spondylolisthesis, lumbar region: Secondary | ICD-10-CM | POA: Insufficient documentation

## 2016-04-13 DIAGNOSIS — Z981 Arthrodesis status: Secondary | ICD-10-CM | POA: Diagnosis not present

## 2016-04-13 MED ORDER — POLYETHYLENE GLYCOL 3350 17 G PO PACK: 17.0000 g | PACK | Freq: Two times a day (BID) | ORAL | 1 refills | Status: DC

## 2016-04-13 NOTE — Patient Instructions (Signed)
Avoid frequent bending and stooping  No lifting greater than 10 lbs. May use ice or moist heat for pain. Weight loss is of benefit. Handicap license is approved.   

## 2016-04-13 NOTE — Addendum Note (Signed)
Addended by: [name] on: 04/13/2016 12:32 PM   Modules accepted: Orders

## 2016-04-13 NOTE — Progress Notes (Signed)
Post-Op Visit Note   Patient: [name] [name]           Date of Birth: 1936-05-09           MRN: [id number] Visit Date: 04/13/2016 PCP: Alesia Richards, MD   Assessment & Plan: 4 months post L1-2 and L2-3 posterolateral and posterior fusion for severe foramenal narrowing with collapsing degenerative disc disease.  Extended to L1-2 due to pedicle broaching and has an autofusion at the T12-L1 level.  Chief Complaint:  Patient is returning today for 6 week recheck. She is now 4 months post op from Right L2-3 facetectomy, posterolateral fusion L2-3 with pedicle screws, rods, local bone graft. States she is not really having any pain just a lot of weakness. Would like some exercises to do to help strengthen her back. Visit Diagnoses: No diagnosis found. Exam with normal motor both legs, normal sensation in both legs. SLR- both legs. Plan: Avoid frequent bending and stooping  No lifting greater than 10 lbs. May use ice or moist heat for pain. Weight loss is of benefit. Handicap license is approved.    Follow-Up Instructions: Avoid frequent bending and stooping  No lifting greater than 10 lbs. May use ice or moist heat for pain. Weight loss is of benefit. Handicap license is approved.    Orders:  No orders of the defined types were placed in this encounter.  No orders of the defined types were placed in this encounter.    PMFS History: Patient Active Problem List   Diagnosis Date Noted  . Acute right lumbar radiculopathy 12/07/2015    Priority: High    Class: Chronic  . Other forms of scoliosis, lumbar region 12/07/2015    Priority: High    Class: Chronic  . HNP (herniated nucleus pulposus), lumbar 05/29/2015    Priority: High    Class: Chronic  . Herniated lumbar intervertebral disc 06/25/2012    Priority: High    Class: Acute  . Intractable nausea and vomiting 03/28/2016  . Leukocytosis 03/28/2016  . Acute kidney injury ([place]) 03/28/2016  . Uncontrollable  vomiting   . Dysphagia   . Nausea and vomiting 12/23/2015  . Ileus ([place])   . Diabetes mellitus without complication ([place]) 0000000  . Spinal stenosis, lumbar region, with neurogenic claudication 05/29/2015  . DDD (degenerative disc disease), lumbar 04/21/2015  . Encounter for Medicare annual wellness exam 02/27/2015  . Mixed hyperlipidemia 11/24/2014  . GERD  11/24/2014  . Depression, major, in partial remission ([place]) 09/26/2014  . Chronic pain syndrome 09/26/2014  . Coronary artery disease due to lipid rich plaque   . Osteoporosis 02/10/2014  . Prediabetes 08/14/2013  . Vitamin D deficiency 08/14/2013  . Medication management 08/14/2013  . Constipation, chronic 04/03/2013  . MGUS (monoclonal gammopathy of unknown significance) 03/05/2013  . Vertebral compression fracture ([place]) 06/23/2012  . ESOPHAGEAL STRICTURE 08/28/2008  . Asthma 07/29/2008  . Incarcerated recurrent paraesophageal hiatal hernia s/p lap redo repair [id number] 01/31/2008  . Essential hypertension 07/25/2007   Past Medical History:  Diagnosis Date  . Anxiety    takes Xanax daily as needed  . Asthma    Albuterol daily as needed  . CAD (coronary artery disease)    LHC 2003 with 40% pLAD, 30% mLAD, 100% D1 (moderate vessel), 100%  D2 (small vessel), 95% mid small OM2.  Pt had PTCA to D1;  D2 and OM2 small vessels and tx medically;  Last Myoview (2012): anterior infarct seen with scar, no ischemia. EF normal. Pt  managed medically;  Lexiscan Myoview (12/14):  Low risk; ant scar with very mild peri-infarct ischemia; EF 55% with ant AK   . Chronic back pain    HNP, DDD, spinal stenosis, vertebral compression fractures.   . Chronic nausea    takes Zofran daily as needed.  normal gastric emptying study 03/2013  . Constipation    felt to be functional. takes Senokot and Miralax daily.  treated with Linzess in past.   . DIVERTICULOSIS, COLON 08/28/2008   Qualifier: Diagnosis of  By: [name], [name], [name]    . Elevated LFTs  2015   probably med induced DILI, from Augmentin vs Pravastatin  . GERD (gastroesophageal reflux disease)    hx of esophageal stricture   . Hiatal hernia    Paraesophageal hernias. s/p fundoplications in 0000000 and 04/2013  . History of bronchitis several of yrs ago  . History of colon polyps 03/2009, 03/2011   adenomatous colon polyps, no high grade dysplasia.   [name] History of vertigo    no meds  . Hyperlipidemia    takes Pravastatin daily  . Hypertension    takes Amlodipine,Metoprolol,and Losartan daily  . Nausea 03/2016  . Osteoporosis   . PONV (postoperative nausea and vomiting)    yrs ago  . Slow urinary stream    takes Rapaflo daily  . TIA (transient ischemic attack) 1995   no residual effects noted    Family History  Problem Relation Age of Onset  . Colon cancer Mother 45  . Heart disease Father 73    heart attack    Past Surgical History:  Procedure Laterality Date  . APPENDECTOMY     patient unsure of date  . BACK SURGERY    . BLADDER REPAIR    . CHOLECYSTECTOMY     Patient unsure of date.  . CORONARY ANGIOPLASTY  11/16/2001   1 stent  . ESOPHAGEAL MANOMETRY N/A 03/25/2013   Procedure: ESOPHAGEAL MANOMETRY (EM);  Surgeon: Inda Castle, MD;  [facility];  Service: Endoscopy;  Laterality: N/A;  . ESOPHAGOGASTRODUODENOSCOPY N/A 03/15/2013   Procedure: ESOPHAGOGASTRODUODENOSCOPY (EGD);  Surgeon: Jerene Bears, MD;  Location[facility] ENDOSCOPY;  Service: Endoscopy;  Laterality: N/A;  . ESOPHAGOGASTRODUODENOSCOPY N/A 12/25/2015   Procedure: ESOPHAGOGASTRODUODENOSCOPY (EGD);  Surgeon: Doran Stabler, MD;  Location: [facility] ENDOSCOPY;  Service: Endoscopy;  Laterality: N/A;  . EYE SURGERY  11/2000   bilateral cataracts with lens implant  . FIXATION KYPHOPLASTY LUMBAR SPINE  X 2   "L3-4"  . HEMORRHOID SURGERY    . HIATAL HERNIA REPAIR  06/03/2011   Procedure: LAPAROSCOPIC REPAIR OF HIATAL HERNIA;  Surgeon: Adin Hector, MD;  [facility];  Service: General;   Laterality: N/A;  . HIATAL HERNIA REPAIR N/A 04/24/2013   Procedure: LAPAROSCOPIC  REPAIR RECURRENT PARASOPHAGEAL HIATAL HERNIA WITH FUNDOPLICATION;  Surgeon: Adin Hector, MD;  [facility];  Service: General;  Laterality: N/A;  . INSERTION OF MESH N/A 04/24/2013   Procedure: INSERTION OF MESH;  Surgeon: Adin Hector, MD;  [facility];  Service: General;  Laterality: N/A;  . LAPAROSCOPIC LYSIS OF ADHESIONS N/A 04/24/2013   Procedure: LAPAROSCOPIC LYSIS OF ADHESIONS;  Surgeon: Adin Hector, MD;  [facility];  Service: General;  Laterality: N/A;  . LAPAROSCOPIC NISSEN FUNDOPLICATION  [id number]   Procedure: LAPAROSCOPIC NISSEN FUNDOPLICATION;  Surgeon: Adin Hector, MD;  [facility];  Service: General;  Laterality: N/A;  . LUMBAR FUSION  12/07/2015   Right L2-3  facetectomy, posterolateral fusion L2-3 with pedicle screws, rods, local bone graft, Vivigen cancellous chips. Fusion extended to the L1 level with pedicle screws and rods  . LUMBAR LAMINECTOMY/DECOMPRESSION MICRODISCECTOMY Right 06/26/2012   Procedure: LUMBAR LAMINECTOMY/DECOMPRESSION MICRODISCECTOMY;  Surgeon: Jessy Oto, MD;  Location: [facility];  Service: Orthopedics;  Laterality: Right;  RIGHT L4-5 MICRODISCECTOMY  . LUMBAR LAMINECTOMY/DECOMPRESSION MICRODISCECTOMY N/A 05/29/2015   Procedure: RIGHT L2-3 MICRODISCECTOMY;  Surgeon: Jessy Oto, MD;  Location: [facility];  Service: Orthopedics;  Laterality: N/A;  . TOTAL ABDOMINAL HYSTERECTOMY  1975   partial   Social History   Occupational History  . Retired Retired    worked at [employer]  . Smoking status: Never Smoker  . Smokeless tobacco: Never Used  . Alcohol use No  . Drug use: No  . Sexual activity: No

## 2016-04-14 ENCOUNTER — Other Ambulatory Visit: Payer: Self-pay | Admitting: *Deleted

## 2016-04-14 MED ORDER — LOSARTAN POTASSIUM 100 MG PO TABS: ORAL_TABLET | ORAL | 1 refills | Status: DC

## 2016-04-18 ENCOUNTER — Ambulatory Visit: Payer: Self-pay | Admitting: Physician Assistant

## 2016-04-19 ENCOUNTER — Ambulatory Visit
Admission: RE | Admit: 2016-04-19 | Discharge: 2016-04-19 | Disposition: A | Discharge status: Home / Self Care | Payer: PPO | Source: Ambulatory Visit | Attending: Internal Medicine | Admitting: Internal Medicine

## 2016-04-19 DIAGNOSIS — N644 Mastodynia: Secondary | ICD-10-CM | POA: Diagnosis not present

## 2016-04-28 DIAGNOSIS — M4726 Other spondylosis with radiculopathy, lumbar region: Secondary | ICD-10-CM | POA: Diagnosis not present

## 2016-04-28 DIAGNOSIS — Z79891 Long term (current) use of opiate analgesic: Secondary | ICD-10-CM | POA: Diagnosis not present

## 2016-04-28 DIAGNOSIS — G894 Chronic pain syndrome: Secondary | ICD-10-CM | POA: Diagnosis not present

## 2016-04-28 DIAGNOSIS — M961 Postlaminectomy syndrome, not elsewhere classified: Secondary | ICD-10-CM | POA: Diagnosis not present

## 2016-05-08 ENCOUNTER — Other Ambulatory Visit: Payer: Self-pay | Admitting: Internal Medicine

## 2016-05-10 ENCOUNTER — Other Ambulatory Visit: Payer: Self-pay | Admitting: *Deleted

## 2016-05-10 MED ORDER — AMLODIPINE BESYLATE 10 MG PO TABS: 10.0000 mg | ORAL_TABLET | Freq: Every day | ORAL | 1 refills | Status: DC

## 2016-05-17 ENCOUNTER — Ambulatory Visit (INDEPENDENT_AMBULATORY_CARE_PROVIDER_SITE_OTHER): Payer: PPO | Admitting: Physician Assistant

## 2016-05-17 VITALS — BP 122/74 | HR 75 | Temp 97.3°F | Resp 16 | Ht 65.0 in | Wt 140.0 lb

## 2016-05-17 DIAGNOSIS — I10 Essential (primary) hypertension: Secondary | ICD-10-CM

## 2016-05-17 DIAGNOSIS — H8110 Benign paroxysmal vertigo, unspecified ear: Secondary | ICD-10-CM | POA: Diagnosis not present

## 2016-05-17 DIAGNOSIS — E119 Type 2 diabetes mellitus without complications: Secondary | ICD-10-CM

## 2016-05-17 DIAGNOSIS — I2510 Atherosclerotic heart disease of native coronary artery without angina pectoris: Secondary | ICD-10-CM | POA: Diagnosis not present

## 2016-05-17 DIAGNOSIS — I2583 Coronary atherosclerosis due to lipid rich plaque: Secondary | ICD-10-CM | POA: Diagnosis not present

## 2016-05-17 DIAGNOSIS — E039 Hypothyroidism, unspecified: Secondary | ICD-10-CM | POA: Diagnosis not present

## 2016-05-17 LAB — CBC WITH DIFFERENTIAL/PLATELET
BASOS ABS: 53 {cells}/uL (ref 0–200)
Basophils Relative: 1 %
EOS PCT: 2 %
Eosinophils Absolute: 106 cells/uL (ref 15–500)
HCT: 41.7 % (ref 35.0–45.0)
Hemoglobin: 13.6 g/dL (ref 11.7–15.5)
LYMPHS PCT: 33 %
Lymphs Abs: 1749 cells/uL (ref 850–3900)
MCH: 29.5 pg (ref 27.0–33.0)
MCHC: 32.6 g/dL (ref 32.0–36.0)
MCV: 90.5 fL (ref 80.0–100.0)
MPV: 10.1 fL (ref 7.5–12.5)
Monocytes Absolute: 530 cells/uL (ref 200–950)
Monocytes Relative: 10 %
NEUTROS ABS: 2862 {cells}/uL (ref 1500–7800)
Neutrophils Relative %: 54 %
PLATELETS: 348 10*3/uL (ref 140–400)
RBC: 4.61 MIL/uL (ref 3.80–5.10)
RDW: 16.2 % — ABNORMAL HIGH (ref 11.0–15.0)
WBC: 5.3 10*3/uL (ref 3.8–10.8)

## 2016-05-17 LAB — COMPREHENSIVE METABOLIC PANEL
ALT: 10 U/L (ref 6–29)
AST: 15 U/L (ref 10–35)
Albumin: 3.9 g/dL (ref 3.6–5.1)
Alkaline Phosphatase: 57 U/L (ref 33–130)
BUN: 14 mg/dL (ref 7–25)
CHLORIDE: 103 mmol/L (ref 98–110)
CO2: 26 mmol/L (ref 20–31)
CREATININE: 0.86 mg/dL (ref 0.60–0.93)
Calcium: 9.8 mg/dL (ref 8.6–10.4)
Glucose, Bld: 110 mg/dL — ABNORMAL HIGH (ref 65–99)
Potassium: 4.5 mmol/L (ref 3.5–5.3)
SODIUM: 139 mmol/L (ref 135–146)
Total Bilirubin: 0.5 mg/dL (ref 0.2–1.2)
Total Protein: 7 g/dL (ref 6.1–8.1)

## 2016-05-17 LAB — TSH: TSH: 1.12 mIU/L

## 2016-05-17 MED ORDER — PROMETHAZINE HCL 25 MG/ML IJ SOLN: 25.0000 mg | Freq: Once | INTRAMUSCULAR | Status: DC

## 2016-05-17 MED ORDER — MECLIZINE HCL 25 MG PO TABS: ORAL_TABLET | ORAL | 0 refills | Status: DC

## 2016-05-17 NOTE — Patient Instructions (Signed)
Get on allergy pill Get on nasal spray Take meclizine as needed, call if it is not helping if any HA, changes vision/speech, imbalance, weakness go to the ER   Dizziness Dizziness is a common problem. It is a feeling of unsteadiness or light-headedness. You may feel like you are about to faint. Dizziness can lead to injury if you stumble or fall. Anyone can become dizzy, but dizziness is more common in older adults. This condition can be caused by a number of things, including medicines, dehydration, or illness. Follow these instructions at home: Taking these steps may help with your condition: Eating and drinking  Drink enough fluid to keep your urine clear or pale yellow. This helps to keep you from becoming dehydrated. Try to drink more clear fluids, such as water.  Do not drink alcohol.  Limit your caffeine intake if directed by your health care provider.  Limit your salt intake if directed by your health care provider. Activity  Avoid making quick movements.  Rise slowly from chairs and steady yourself until you feel okay.  In the morning, first sit up on the side of the bed. When you feel okay, stand slowly while you hold onto something until you know that your balance is fine.  Move your legs often if you need to stand in one place for a long time. Tighten and relax your muscles in your legs while you are standing.  Do not drive or operate heavy machinery if you feel dizzy.  Avoid bending down if you feel dizzy. Place items in your home so that they are easy for you to reach without leaning over. Lifestyle  Do not use any tobacco products, including cigarettes, chewing tobacco, or electronic cigarettes. If you need help quitting, ask your health care provider.  Try to reduce your stress level, such as with yoga or meditation. Talk with your health care provider if you need help. General instructions  Watch your dizziness for any changes.  Take medicines only as  directed by your health care provider. Talk with your health care provider if you think that your dizziness is caused by a medicine that you are taking.  Tell a friend or a family member that you are feeling dizzy. If he or she notices any changes in your behavior, have this person call your health care provider.  Keep all follow-up visits as directed by your health care provider. This is important. Contact a health care provider if:  Your dizziness does not go away.  Your dizziness or light-headedness gets worse.  You feel nauseous.  You have reduced hearing.  You have new symptoms.  You are unsteady on your feet or you feel like the room is spinning. Get help right away if:  You vomit or have diarrhea and are unable to eat or drink anything.  You have problems talking, walking, swallowing, or using your arms, hands, or legs.  You feel generally weak.  You are not thinking clearly or you have trouble forming sentences. It may take a friend or family member to notice this.  You have chest pain, abdominal pain, shortness of breath, or sweating.  Your vision changes.  You notice any bleeding.  You have a headache.  You have neck pain or a stiff neck.  You have a fever. This information is not intended to replace advice given to you by your health care provider. Make sure you discuss any questions you have with your health care provider. Document Released: 10/19/2000 Document  Revised: 10/01/2015 Document Reviewed: 04/21/2014 Elsevier Interactive Patient Education  2017 [employer]  Benign Paroxysmal Positional Vertigo (BPPV)  General Information In Benign Paroxysmal Positional Vertigo (BPPV) dizziness is generally thought to be due to debris which has collected within a part of the inner ear. This debris can be thought of as "ear rocks", although the formal name is "otoconia". Ear rocks are small crystals of calcium carbonate derived from a structure in the ear called  the "utricle" (figure to the right ). The symptoms of BPPV include dizziness or vertigo, lightheadedness, imbalance, and nausea. Activities which bring on symptoms will vary among persons, but symptoms are usually followed by a change of position of the head like getting out of bed or rolling over in bed are common "problem" motions .  However if you have worsening HA, changes vision/speech, weakness go to the ER     What can be done? 1) Use two or more pillows at night. Avoid sleeping on the "bad" side. In the morning, get up slowly and sit on the edge of the bed for a minute. 2) Medication prescribed by your doctor.  3) The exercises below, you can do at home to help you prevent the sensation later in the day.  4) We can refer you to physical therapy at [facility], # [place] treatments: The Brandt-Daroff Exercises are a home method of treating BPPV,and are effective 95% of the time.  These exercises are performed in three sets per day for two weeks. In each set, one performs the maneuver as shown five times. Start sitting upright (position 1). Then move into the side-lying position (position 2), with the head angled upward about halfway. An easy way to remember this is to imagine someone standing about 6 feet in front of you, and just keep looking at their head at all times. Stay in the side-lying position for 30 seconds, or until the dizziness subsides if this is longer, then go back to the sitting position (position 3). Stay there for 30 seconds, and then go to the opposite side (position 4) and follow the same routine.  At home Epley Maneuver This procedure seems to be even more effective than the in-office procedure, perhaps because it is repeated every night for a week.  The method (for the left side) is performed as shown on the figure below.  1) One stays in each of the supine (lying down) positions for 30 seconds, and in the sitting upright position (top)  for 1 minute.  2) Thus, once cycle takes 2 1/2 minutes.  3) Typically 3 cycles are performed just prior to going to sleep.  4) It is best to do them at night rather than in the morning or midday, as if one becomes dizzy following the exercises, then it can resolve while one is sleeping.  The mirror image of this procedure is used for the right ear.

## 2016-05-17 NOTE — Addendum Note (Signed)
Addended by: Vicie Mutters R on: 05/17/2016 12:05 PM   Modules accepted: Orders

## 2016-05-17 NOTE — Progress Notes (Signed)
Subjective:    Patient ID: [name] [name], female    DOB: 07-17-1935, 81 y.o.   MRN: [id number]  HPI 81 y.o. WM with history of DM2, incarcerated hiatal hernia, HTN, CKD, depression, MGUS presents for 1 week of dizziness. She states she has felt something like this in the past. Woke up, worse with movement/turning her head, spinning sensation, with nausea no vomiting at this time. Not getting any better. Has taken tylenol for it without help  Associated ear symptoms: none. Associated CNS symptoms: none. Recent infections: sinus infection. Head trauma: denied. Drug ingestion: none. Noise exposure: no occupational exposure. Family history: non-contributory. Normal CT head 12/2015 due to vertigo/dizziness.   Blood pressure 122/74, pulse 75, temperature 97.3 F (36.3 C), resp. rate 16, height 5\' 5"  (1.651 m), weight 140 lb (63.5 kg), SpO2 97 %.  Medications Current Outpatient Prescriptions on File Prior to Visit  Medication Sig  . acetaminophen (TYLENOL) 325 MG tablet Take 2 tablets (650 mg total) by mouth every 6 (six) hours as needed for mild pain (or Fever >/= 101).  [name] albuterol (PROVENTIL HFA;VENTOLIN HFA) 108 (90 BASE) MCG/ACT inhaler Inhale 1-2 puffs into the lungs every 6 (six) hours as needed for wheezing or shortness of breath.  . ALPRAZolam (XANAX) 0.5 MG tablet TAKE ONE-HALF TO ONE TABLET BY MOUTH TWO TO THREE TIMES DAILY FOR NERVES  . amLODipine (NORVASC) 10 MG tablet Take 1 tablet (10 mg total) by mouth at bedtime.  . Cholecalciferol (VITAMIN D3) 2000 UNITS TABS Take 6,000 Units by mouth daily.   [name] gabapentin (NEURONTIN) 300 MG capsule Take 1 capsule (300 mg total) by mouth 3 (three) times daily.  [name] ipratropium-albuterol (DUONEB) 0.5-2.5 (3) MG/3ML SOLN USE ONE VIAL IN NEBULIZER 4 TIMES DAILY (Patient taking differently: Inhale 3 mLs into the lungs every 6 (six) hours as needed. For shortness of breath)  . losartan (COZAAR) 100 MG tablet TAKE ONE TABLET BY MOUTH ONCE DAILY (  DISCONTINUE BENICAR)  . magnesium oxide (MAG-OX) 400 MG tablet Take 400 mg by mouth daily.  . metFORMIN (GLUCOPHAGE-XR) 500 MG 24 hr tablet TAKE ONE TO TWO TABLETS BY MOUTH TWICE DAILY WITH MEALS FOR DIABETES  . metoprolol (LOPRESSOR) 50 MG tablet TAKE ONE TABLET BY MOUTH TWICE DAILY FOR BLOOD PRESSURE  . mometasone-formoterol (DULERA) 200-5 MCG/ACT AERO Inhale 2 puffs into the lungs 2 (two) times daily. (Patient taking differently: Inhale 2 puffs into the lungs 2 (two) times daily as needed for wheezing. )  . nitroGLYCERIN (NITROSTAT) 0.4 MG SL tablet Place 1 tablet (0.4 mg total) under the tongue every 5 (five) minutes as needed for chest pain.  [name] ondansetron (ZOFRAN-ODT) 8 MG disintegrating tablet DISSOLVE ONE TABLET IN MOUTH EVERY 8 HOURS AS NEEDED FOR NAUSEA AND VOMITING  . orphenadrine (NORFLEX) 100 MG tablet Take 1 tablet (100 mg total) by mouth 2 (two) times daily as needed for muscle spasms.  [name] OVER THE COUNTER MEDICATION Take 1,000 mg by mouth 2 (two) times daily. [place] Calcium 1000mg   . oxyCODONE (OXY IR/ROXICODONE) 5 MG immediate release tablet Take 1 tablet (5 mg total) by mouth every 4 (four) hours as needed for severe pain or breakthrough pain. (Patient taking differently: Take 5 mg by mouth every 6 (six) hours as needed for severe pain or breakthrough pain. )  . pantoprazole (PROTONIX) 40 MG tablet Take 1 tablet (40 mg total) by mouth daily.  . polyethylene glycol (MIRALAX / GLYCOLAX) packet Take 17 g by mouth 2 (two) times  daily.  . potassium chloride SA (K-DUR,KLOR-CON) 20 MEQ tablet Take 1 tablet (20 mEq total) by mouth 2 (two) times daily.  . pravastatin (PRAVACHOL) 40 MG tablet Take 1 tablet (40 mg total) by mouth daily.  . promethazine (PHENERGAN) 25 MG tablet Take 1 tablet (25 mg total) by mouth every 6 (six) hours as needed for nausea or vomiting.  . senna-docusate (SENOKOT-S) 8.6-50 MG tablet Take 2 tablets by mouth 2 (two) times daily.  . silodosin (RAPAFLO) 8 MG CAPS  capsule Take 8 mg by mouth every other day.   . sucralfate (CARAFATE) 1 g tablet TAKE ONE TABLET BY MOUTH 4 TIMES DAILY BEFORE MEALS AND AT BEDTIME FOR HEARTBURN   No current facility-administered medications on file prior to visit.     Problem list She has Essential hypertension; Asthma; ESOPHAGEAL STRICTURE; Incarcerated recurrent paraesophageal hiatal hernia s/p lap redo repair [id number]; Vertebral compression fracture ([place]); Herniated lumbar intervertebral disc; MGUS (monoclonal gammopathy of unknown significance); Constipation, chronic; Prediabetes; Vitamin D deficiency; Medication management; Osteoporosis; Coronary artery disease due to lipid rich plaque; Depression, major, in partial remission ([place]); Chronic pain syndrome; Mixed hyperlipidemia; GERD ; Encounter for Medicare annual wellness exam; DDD (degenerative disc disease), lumbar; HNP (herniated nucleus pulposus), lumbar; Spinal stenosis, lumbar region, with neurogenic claudication; Acute right lumbar radiculopathy; Other forms of scoliosis, lumbar region; Diabetes mellitus without complication ([place]); Ileus ([place]); Nausea and vomiting; Uncontrollable vomiting; Dysphagia; Intractable nausea and vomiting; Leukocytosis; Acute kidney injury ([place]); History of lumbar spinal fusion; and Spondylolisthesis, lumbar region on her problem list.   Review of Systems  Constitutional: Negative.  Negative for chills, fatigue and fever.  HENT: Positive for congestion. Negative for dental problem, drooling, ear discharge, ear pain, facial swelling, hearing loss, mouth sores, nosebleeds, postnasal drip, rhinorrhea, sinus pressure, sneezing, sore throat, tinnitus, trouble swallowing and voice change.   Eyes: Negative.  Negative for visual disturbance.  Respiratory: Negative.   Cardiovascular: Negative.   Gastrointestinal: Negative.   Musculoskeletal: Negative.  Negative for neck pain and neck stiffness.  Neurological: Positive for dizziness. Negative for  tremors, seizures, syncope, facial asymmetry, speech difficulty, weakness, light-headedness, numbness and headaches.  Psychiatric/Behavioral: Negative.        Objective:   Physical Exam  Constitutional: She is oriented to person, place, and time. She appears well-developed and well-nourished.  HENT:  Head: Normocephalic and atraumatic.  Right Ear: Hearing and external ear normal. No mastoid tenderness. Tympanic membrane is not injected, not erythematous, not retracted and not bulging. A middle ear effusion is present.  Left Ear: Hearing and external ear normal. No mastoid tenderness. Tympanic membrane is not injected, not erythematous, not retracted and not bulging. A middle ear effusion is present.  Eyes: Conjunctivae are normal. Pupils are equal, round, and reactive to light.  Neck: Normal range of motion. Neck supple.  Cardiovascular: Normal rate.   Pulmonary/Chest: Effort normal and breath sounds normal.  Musculoskeletal: Normal range of motion.  Neurological: She is alert and oriented to person, place, and time. She has normal strength and normal reflexes. She displays no tremor. No cranial nerve deficit or sensory deficit. Coordination normal. Abnormal gait: walks with cane.  Skin: Skin is warm and dry.  Psychiatric: She has a normal mood and affect. Her behavior is normal. Thought content normal.       Assessment & Plan:  Vertigo- nonsmoker, normal neuro, does have history of vertigo in the past, meclizine PRN, exercises given, continue bASA, if worsening HA, changes vision/speech, imbalance, weakness go to the  ER, get on allergy pill, get on flonase Follow up 2 weeks  Future Appointments Date Time Provider [place]  06/28/2016 10:00 AM [name], MD [id number] None

## 2016-05-18 ENCOUNTER — Other Ambulatory Visit: Payer: Self-pay | Admitting: Physician Assistant

## 2016-05-18 MED ORDER — FLUTICASONE PROPIONATE 50 MCG/ACT NA SUSP: 2.0000 | Freq: Every day | NASAL | 1 refills | Status: DC

## 2016-05-18 NOTE — Progress Notes (Signed)
Pt aware of lab results & voiced understanding of those results.

## 2016-06-01 DIAGNOSIS — G894 Chronic pain syndrome: Secondary | ICD-10-CM | POA: Diagnosis not present

## 2016-06-01 DIAGNOSIS — M4726 Other spondylosis with radiculopathy, lumbar region: Secondary | ICD-10-CM | POA: Diagnosis not present

## 2016-06-01 DIAGNOSIS — M961 Postlaminectomy syndrome, not elsewhere classified: Secondary | ICD-10-CM | POA: Diagnosis not present

## 2016-06-01 DIAGNOSIS — Z79891 Long term (current) use of opiate analgesic: Secondary | ICD-10-CM | POA: Diagnosis not present

## 2016-06-04 ENCOUNTER — Other Ambulatory Visit: Payer: Self-pay | Admitting: Internal Medicine

## 2016-06-06 ENCOUNTER — Other Ambulatory Visit: Payer: Self-pay | Admitting: *Deleted

## 2016-06-06 MED ORDER — PANTOPRAZOLE SODIUM 40 MG PO TBEC: 40.0000 mg | DELAYED_RELEASE_TABLET | Freq: Every day | ORAL | 1 refills | Status: DC

## 2016-06-08 ENCOUNTER — Encounter: Payer: Self-pay | Admitting: Physician Assistant

## 2016-06-08 ENCOUNTER — Ambulatory Visit (INDEPENDENT_AMBULATORY_CARE_PROVIDER_SITE_OTHER): Payer: PPO | Admitting: Physician Assistant

## 2016-06-08 VITALS — BP 140/70 | HR 86 | Temp 97.7°F | Resp 16 | Ht 65.0 in | Wt 146.6 lb

## 2016-06-08 DIAGNOSIS — E559 Vitamin D deficiency, unspecified: Secondary | ICD-10-CM

## 2016-06-08 DIAGNOSIS — Z0001 Encounter for general adult medical examination with abnormal findings: Secondary | ICD-10-CM

## 2016-06-08 DIAGNOSIS — K5909 Other constipation: Secondary | ICD-10-CM

## 2016-06-08 DIAGNOSIS — H8110 Benign paroxysmal vertigo, unspecified ear: Secondary | ICD-10-CM

## 2016-06-08 DIAGNOSIS — D472 Monoclonal gammopathy: Secondary | ICD-10-CM | POA: Diagnosis not present

## 2016-06-08 DIAGNOSIS — I2510 Atherosclerotic heart disease of native coronary artery without angina pectoris: Secondary | ICD-10-CM

## 2016-06-08 DIAGNOSIS — M810 Age-related osteoporosis without current pathological fracture: Secondary | ICD-10-CM

## 2016-06-08 DIAGNOSIS — R6889 Other general symptoms and signs: Secondary | ICD-10-CM | POA: Diagnosis not present

## 2016-06-08 DIAGNOSIS — K440 Diaphragmatic hernia with obstruction, without gangrene: Secondary | ICD-10-CM | POA: Diagnosis not present

## 2016-06-08 DIAGNOSIS — G894 Chronic pain syndrome: Secondary | ICD-10-CM

## 2016-06-08 DIAGNOSIS — M5126 Other intervertebral disc displacement, lumbar region: Secondary | ICD-10-CM

## 2016-06-08 DIAGNOSIS — R7303 Prediabetes: Secondary | ICD-10-CM | POA: Diagnosis not present

## 2016-06-08 DIAGNOSIS — F3341 Major depressive disorder, recurrent, in partial remission: Secondary | ICD-10-CM | POA: Diagnosis not present

## 2016-06-08 DIAGNOSIS — Z981 Arthrodesis status: Secondary | ICD-10-CM

## 2016-06-08 DIAGNOSIS — M5136 Other intervertebral disc degeneration, lumbar region: Secondary | ICD-10-CM

## 2016-06-08 DIAGNOSIS — M4850XS Collapsed vertebra, not elsewhere classified, site unspecified, sequela of fracture: Secondary | ICD-10-CM

## 2016-06-08 DIAGNOSIS — J45909 Unspecified asthma, uncomplicated: Secondary | ICD-10-CM

## 2016-06-08 DIAGNOSIS — Z79899 Other long term (current) drug therapy: Secondary | ICD-10-CM | POA: Diagnosis not present

## 2016-06-08 DIAGNOSIS — D72829 Elevated white blood cell count, unspecified: Secondary | ICD-10-CM

## 2016-06-08 DIAGNOSIS — Z0000 Encounter for general adult medical examination without abnormal findings: Secondary | ICD-10-CM

## 2016-06-08 DIAGNOSIS — I10 Essential (primary) hypertension: Secondary | ICD-10-CM | POA: Diagnosis not present

## 2016-06-08 DIAGNOSIS — E782 Mixed hyperlipidemia: Secondary | ICD-10-CM

## 2016-06-08 DIAGNOSIS — I2583 Coronary atherosclerosis due to lipid rich plaque: Secondary | ICD-10-CM

## 2016-06-08 DIAGNOSIS — K219 Gastro-esophageal reflux disease without esophagitis: Secondary | ICD-10-CM

## 2016-06-08 DIAGNOSIS — M4316 Spondylolisthesis, lumbar region: Secondary | ICD-10-CM

## 2016-06-08 DIAGNOSIS — IMO0001 Reserved for inherently not codable concepts without codable children: Secondary | ICD-10-CM

## 2016-06-08 DIAGNOSIS — F411 Generalized anxiety disorder: Secondary | ICD-10-CM

## 2016-06-08 DIAGNOSIS — K222 Esophageal obstruction: Secondary | ICD-10-CM

## 2016-06-08 DIAGNOSIS — K567 Ileus, unspecified: Secondary | ICD-10-CM

## 2016-06-08 DIAGNOSIS — M48062 Spinal stenosis, lumbar region with neurogenic claudication: Secondary | ICD-10-CM

## 2016-06-08 MED ORDER — MECLIZINE HCL 25 MG PO TABS: ORAL_TABLET | ORAL | 2 refills | Status: DC

## 2016-06-08 NOTE — Progress Notes (Signed)
MEDICARE ANNUAL WELLNESS VISIT AND FOLLOW UP  Assessment:   Essential hypertension - continue medications, DASH diet, exercise and monitor at home. Call if greater than 130/80.  - CBC with Differential/Platelet - BASIC METABOLIC PANEL WITH GFR - Hepatic function panel - TSH  Prediabetes Discussed general issues about diabetes pathophysiology and management., Educational material distributed., Suggested low cholesterol diet., Encouraged aerobic exercise., Discussed foot care., Reminded to get yearly retinal exam. - Hemoglobin A1c   Mixed hyperlipidemia -continue medications, check lipids, decrease fatty foods, increase activity.  - Lipid panel  Coronary artery disease due to lipid rich plaque Control blood pressure, cholesterol, glucose, increase exercise.  - Lipid panel - Hemoglobin A1c  Recurrent major depressive disorder, in partial remission (HCC) Remission, negative screen  Atherosclerosis of autologous vein coronary artery bypass graft with angina pectoris ([place]) Control blood pressure, cholesterol, glucose, increase exercise.  - Lipid panel - Hemoglobin A1c  Incarcerated recurrent paraesophageal hiatal hernia s/p lap redo repair [id number] Improved, continue PPI   Asthma, unspecified asthma severity, uncomplicated controlled  Vertebral compression fracture, sequela Continue fosamax, DEXA due 2 years, continue Vit D and Ca, weight bearing exercises  Osteoporosis Continue fosamax, DEXA due this year, continue Vit D and Ca, weight bearing exercises   Herniated lumbar intervertebral disc Continue pain management  Vitamin D deficiency - Vit D  25 hydroxy (rtn osteoporosis monitoring)  MGUS (monoclonal gammopathy of unknown significance) - CBC with Differential/Platelet  Medication management - Magnesium  Chronic pain syndrome Continue pain management   Anxiety continue medications, stress management techniques discussed, increase water, good sleep hygiene  discussed, increase exercise, and increase veggies.    Gastroesophageal reflux disease, esophagitis presence not specified Continue PPI/H2 blocker, diet discussed   ESOPHAGEAL STRICTURE improved   Constipation, chronic Continue miralax  Medicare visit Get MGM, get DEXA, get colonoscopy  Ileus ([place]) Improved a/p surgery  Spondylolisthesis, lumbar region RICE, NSAIDS, exercises given, if not better get xray and PT referral or ortho referral.   DDD (degenerative disc disease), lumbar Continue follow up pain management  Spinal stenosis, lumbar region, with neurogenic claudication Continue follow up pain management  Leukocytosis, unspecified type Check CBC  History of lumbar spinal fusion Continue follow up pain management    Over 30 minutes of exam, counseling, chart review, and critical decision making was performed  Plan:   During the course of the visit the patient was educated and counseled about appropriate screening and preventive services including:    Pneumococcal vaccine   Influenza vaccine  Td vaccine  Prevnar 13  Screening electrocardiogram  Screening mammography  Bone densitometry screening  Colorectal cancer screening  Diabetes screening  Glaucoma screening  Nutrition counseling   Advanced directives: given info/requested copies   Subjective:   [name] [name] is a 81 y.o. female who presents for Medicare Annual Wellness Visit and 3 month follow up on hypertension, prediabetes, hyperlipidemia, vitamin D def.   Her blood pressure has been controlled at home, today their BP is BP: 140/70  She has history of PTCA/stent placed in 2003 and low risk myoview in 2014.  She has a history of COPD/asthma, follows with Dr. Melvyn Novas, on dulera needs refill, and albuterol.  She has DDD and osteoporosis s/p fusion L1-2, L2-L3 July 2017 with history of vertebral compression fx, followed by Dr. Louanne Skye and Cruzita Lederer. She has history of MGUS with negative BX. On  norco, gabapentin..    She has been having some dizziness/vertigo, improving, takes 1/2 meclizine that helps.  She does  not workout due to chronic pain. She is on the foxamax 70 x July 2016.   She is on cholesterol medication and denies myalgias. Her cholesterol is at goal. The cholesterol last visit was:   Lab Results  Component Value Date   CHOL 122 (L) 01/14/2016   HDL 68 01/14/2016   LDLCALC 35 01/14/2016   TRIG 93 01/14/2016   CHOLHDL 1.8 01/14/2016  She has history of Hiatal hernia and is s/p surgery for this, doing better.  She has been working on diet and exercise for prediabetes (x 2010), and denies polydipsia, polyuria and visual disturbances. Last A1C in the office was:  Lab Results  Component Value Date   HGBA1C 5.5 12/23/2015  Last GFR  Lab Results  Component Value Date   GFRNONAA 58 (L) 04/05/2016  Patient is on Vitamin D supplement. Lab Results  Component Value Date   VD25OH 64 01/14/2016      Medication Review Current Outpatient Prescriptions on File Prior to Visit  Medication Sig Dispense Refill  . acetaminophen (TYLENOL) 325 MG tablet Take 2 tablets (650 mg total) by mouth every 6 (six) hours as needed for mild pain (or Fever >/= 101). 20 tablet 0  . albuterol (PROVENTIL HFA;VENTOLIN HFA) 108 (90 BASE) MCG/ACT inhaler Inhale 1-2 puffs into the lungs every 6 (six) hours as needed for wheezing or shortness of breath.    . ALPRAZolam (XANAX) 0.5 MG tablet TAKE ONE-HALF TO ONE TABLET BY MOUTH TWO TO THREE TIMES DAILY FOR NERVES 90 tablet 5  . amLODipine (NORVASC) 10 MG tablet Take 1 tablet (10 mg total) by mouth at bedtime. 30 tablet 1  . Cholecalciferol (VITAMIN D3) 2000 UNITS TABS Take 6,000 Units by mouth daily.     . fluticasone (FLONASE) 50 MCG/ACT nasal spray Place 2 sprays into both nostrils at bedtime. 16 g 1  . ipratropium-albuterol (DUONEB) 0.5-2.5 (3) MG/3ML SOLN USE ONE VIAL IN NEBULIZER 4 TIMES DAILY (Patient taking differently: Inhale 3 mLs into the  lungs every 6 (six) hours as needed. For shortness of breath) 360 mL 99  . losartan (COZAAR) 100 MG tablet TAKE ONE TABLET BY MOUTH ONCE DAILY ( DISCONTINUE BENICAR) 90 tablet 1  . magnesium oxide (MAG-OX) 400 MG tablet Take 400 mg by mouth daily.    . meclizine (ANTIVERT) 25 MG tablet 1/2-1 pill up to 3 times daily for motion sickness/dizziness 60 tablet 0  . metFORMIN (GLUCOPHAGE-XR) 500 MG 24 hr tablet TAKE ONE TO TWO TABLETS BY MOUTH TWICE DAILY WITH MEALS FOR DIABETES 360 tablet 1  . metoprolol (LOPRESSOR) 50 MG tablet TAKE ONE TABLET BY MOUTH TWICE DAILY FOR BLOOD PRESSURE 180 tablet 1  . mometasone-formoterol (DULERA) 200-5 MCG/ACT AERO Inhale 2 puffs into the lungs 2 (two) times daily. (Patient taking differently: Inhale 2 puffs into the lungs 2 (two) times daily as needed for wheezing. ) 13 g 3  . nitroGLYCERIN (NITROSTAT) 0.4 MG SL tablet Place 1 tablet (0.4 mg total) under the tongue every 5 (five) minutes as needed for chest pain. 90 tablet 3  . ondansetron (ZOFRAN-ODT) 8 MG disintegrating tablet DISSOLVE ONE TABLET IN MOUTH EVERY 8 HOURS AS NEEDED FOR NAUSEA AND VOMITING 30 tablet 5  . orphenadrine (NORFLEX) 100 MG tablet Take 1 tablet (100 mg total) by mouth 2 (two) times daily as needed for muscle spasms. 40 tablet 0  . OVER THE COUNTER MEDICATION Take 1,000 mg by mouth 2 (two) times daily. [place] Calcium 1000mg     . oxyCODONE (  OXY IR/ROXICODONE) 5 MG immediate release tablet Take 1 tablet (5 mg total) by mouth every 4 (four) hours as needed for severe pain or breakthrough pain. (Patient taking differently: Take 5 mg by mouth every 6 (six) hours as needed for severe pain or breakthrough pain. ) 15 tablet 0  . pantoprazole (PROTONIX) 40 MG tablet Take 1 tablet (40 mg total) by mouth daily. 90 tablet 1  . polyethylene glycol (MIRALAX / GLYCOLAX) packet Take 17 g by mouth 2 (two) times daily. 72 each 1  . potassium chloride SA (K-DUR,KLOR-CON) 20 MEQ tablet Take 1 tablet (20 mEq total) by  mouth 2 (two) times daily. 30 tablet 0  . pravastatin (PRAVACHOL) 40 MG tablet Take 1 tablet (40 mg total) by mouth daily. 90 tablet 1  . promethazine (PHENERGAN) 25 MG tablet Take 1 tablet (25 mg total) by mouth every 6 (six) hours as needed for nausea or vomiting. 100 tablet 0  . senna-docusate (SENOKOT-S) 8.6-50 MG tablet Take 2 tablets by mouth 2 (two) times daily. 60 tablet 1  . silodosin (RAPAFLO) 8 MG CAPS capsule Take 8 mg by mouth every other day.     . sucralfate (CARAFATE) 1 g tablet TAKE ONE TABLET BY MOUTH 4 TIMES DAILY BEFORE MEALS AND AT BEDTIME FOR HEARTBURN 360 tablet 1   Current Facility-Administered Medications on File Prior to Visit  Medication Dose Route Frequency Provider Last Rate Last Dose  . promethazine (PHENERGAN) injection 25 mg  25 mg Intramuscular Once Vicie Mutters, PA-C        Current Problems (verified) Patient Active Problem List   Diagnosis Date Noted  . History of lumbar spinal fusion 04/13/2016  . Spondylolisthesis, lumbar region 04/13/2016  . Intractable nausea and vomiting 03/28/2016  . Leukocytosis 03/28/2016  . Acute kidney injury ([place]) 03/28/2016  . Uncontrollable vomiting   . Dysphagia   . Nausea and vomiting 12/23/2015  . Ileus ([place])   . Acute right lumbar radiculopathy 12/07/2015    Class: Chronic  . Other forms of scoliosis, lumbar region 12/07/2015    Class: Chronic  . HNP (herniated nucleus pulposus), lumbar 05/29/2015    Class: Chronic  . Spinal stenosis, lumbar region, with neurogenic claudication 05/29/2015  . DDD (degenerative disc disease), lumbar 04/21/2015  . Encounter for Medicare annual wellness exam 02/27/2015  . Mixed hyperlipidemia 11/24/2014  . GERD  11/24/2014  . Depression, major, in partial remission ([place]) 09/26/2014  . Chronic pain syndrome 09/26/2014  . Coronary artery disease due to lipid rich plaque   . Osteoporosis 02/10/2014  . Prediabetes 08/14/2013  . Vitamin D deficiency 08/14/2013  . Medication  management 08/14/2013  . Constipation, chronic 04/03/2013  . MGUS (monoclonal gammopathy of unknown significance) 03/05/2013  . Herniated lumbar intervertebral disc 06/25/2012    Class: Acute  . Vertebral compression fracture ([place]) 06/23/2012  . ESOPHAGEAL STRICTURE 08/28/2008  . Asthma 07/29/2008  . Incarcerated recurrent paraesophageal hiatal hernia s/p lap redo repair [id number] 01/31/2008  . Essential hypertension 07/25/2007    Screening Tests Immunization History  Administered Date(s) Administered  . DT 05/06/2014  . Influenza Split 02/07/2011  . Influenza Whole 02/09/2009, 01/19/2010, 02/13/2012  . Influenza, High Dose Seasonal PF 03/22/2013, 01/15/2014, 02/27/2015, 01/14/2016  . Pneumococcal Conjugate-13 05/06/2014  . Pneumococcal Polysaccharide-23 02/09/2011  . Pneumococcal-Unspecified 02/06/2010, 02/09/2012  . Td 05/10/2003    Preventative care: Last colonoscopy: 12/2010 Dr. [name], DUE EGD 12/2015 Last mammogram: 05/2015 DUE Last pap smear/pelvic exam: remote,declines   DEXA 01/2015 Due 01/2017 CT head  01/2014 Ct AB 11/2013 CT chest 11/2013 Myocardial perfusion 09/2014, previous MI, low risk study Echo 08/2014, EF 55-60%   Prior vaccinations: TD or Tdap: 04/2014  Influenza: 2017  Pneumococcal: 2012 Prevnar13:  2015 Shingles/Zostavax: declines due to cost  Names of Other Physician/Practitioners you currently use: 1. [facility]- here for primary care 2. Dr. Katy Fitch eye doctor, last visit April 2017 3. No dentist, dentures.   Patient Care Team: [name], MD as PCP - General (Internal Medicine) Inda Castle, MD as Consulting Physician (Gastroenterology) Rigoberto Noel, MD as Consulting Physician (Pulmonary Disease) Larey Dresser, MD as Consulting Physician (Cardiology) Michael Boston, MD as Consulting Physician (General Surgery) Jessy Oto, MD as Consulting Physician (Orthopedic Surgery) Philemon Kingdom, MD  as Consulting Physician (Endocrinology) Curt Bears, MD as Consulting Physician (Oncology)  Allergies Allergies  Allergen Reactions  . Ace Inhibitors Cough    Per Dr Melvyn Novas pulmonology 2013  . Aspartame Diarrhea and Nausea And Vomiting  . Atorvastatin Other (See Comments)    Muscle aches  . Biaxin [Clarithromycin] Other (See Comments)    PATIENT CAN TOLERATE Z-PAK.  Is written on patient's paper chart.  [name] [Roflumilast] Nausea And Vomiting  . Erythromycin Other (See Comments)    PATIENT CAN TOLERATE Z-PAK.  It is written on patient's paper chart.  . Levofloxacin Nausea And Vomiting    SURGICAL HISTORY She  has a past surgical history that includes Back surgery; Bladder repair; Hemorrhoid surgery; Cholecystectomy; Appendectomy; Total abdominal hysterectomy (1975); Eye surgery (11/2000); Hiatal hernia repair (06/03/2011); Laparoscopic Nissen fundoplication ([id number]); Lumbar laminectomy/decompression microdiscectomy (Right, 06/26/2012); Esophagogastroduodenoscopy (N/A, 03/15/2013); Esophageal manometry (N/A, 03/25/2013); Hiatal hernia repair (N/A, 04/24/2013); Insertion of mesh (N/A, 04/24/2013); Laparoscopic lysis of adhesions (N/A, 04/24/2013); Fixation kyphoplasty lumbar spine (X 2); Coronary angioplasty (11/16/2001); Lumbar laminectomy/decompression microdiscectomy (N/A, 05/29/2015); Lumbar fusion (12/07/2015); and Esophagogastroduodenoscopy (N/A, 12/25/2015). FAMILY HISTORY Her family history includes Colon cancer (age of onset: 39) in her mother; Heart disease (age of onset: 65) in her father. SOCIAL HISTORY She  reports that she has never smoked. She has never used smokeless tobacco. She reports that she does not drink alcohol or use drugs.  MEDICARE WELLNESS OBJECTIVES: Physical activity: Current Exercise Habits: The patient does not participate in regular exercise at present (some walking with dogs when she can), Exercise limited by: orthopedic condition(s) Cardiac risk  factors: Cardiac Risk Factors include: advanced age (>78men, >74 women);hypertension;sedentary lifestyle Depression/mood screen:   Depression screen [facility] 2/9 06/08/2016  Decreased Interest 0  Down, Depressed, Hopeless 0  PHQ - 2 Score 0  Altered sleeping -  Tired, decreased energy -  Change in appetite -  Feeling bad or failure about yourself  -  Trouble concentrating -  Moving slowly or fidgety/restless -  Suicidal thoughts -  PHQ-9 Score -  Difficult doing work/chores -  Some recent data might be hidden    ADLs:  In your present state of health, do you have any difficulty performing the following activities: 06/08/2016 03/28/2016  Hearing? N N  Vision? N N  Difficulty concentrating or making decisions? N N  Walking or climbing stairs? N N  Dressing or bathing? N N  Doing errands, shopping? N N  Some recent data might be hidden     Cognitive Testing  Alert? Yes  Normal Appearance?Yes  Oriented to person? Yes  Place? Yes   Time? Yes  Recall of three objects?  Yes  Can perform simple calculations? Yes  Displays appropriate  judgment?Yes  Can read the correct time from a watch face?Yes  EOL planning: Does Patient Have a Medical Advance Directive?: Yes Type of Advance Directive: Healthcare Power of Attorney, Living will Copy of [place] in Chart?: No - copy requested   Objective:   Today's Vitals   06/08/16 0950  BP: 140/70  Pulse: 86  Resp: 16  Temp: 97.7 F (36.5 C)  SpO2: 97%  Weight: 146 lb 9.6 oz (66.5 kg)  Height: 5\' 5"  (1.651 m)  PainSc: 6   PainLoc: Back   Body mass index is 24.4 kg/m.  General appearance: alert, no distress, WD/WN,  female HEENT: normocephalic, sclerae anicteric, TMs pearly, nares patent, no discharge or erythema, pharynx normal Oral cavity: MMM, no lesions Neck: supple, no lymphadenopathy, no thyromegaly, no masses Heart: RRR, normal S1, S2, no murmurs Lungs: CTA bilaterally, no wheezes, rhonchi, or  rales Abdomen: +bs, soft, non tender, non distended, no masses, no hepatomegaly, no splenomegaly Musculoskeletal: nontender, no swelling, no obvious deformity Extremities: no edema, no cyanosis, no clubbing Pulses: 2+ symmetric, upper and lower extremities, normal cap refill Neurological: alert, oriented x 3, CN2-12 intact, strength 4/5 upper extremities and lower extremities, DTRs 2+ throughout, no cerebellar signs, gait antaglic with cane Psychiatric: normal affect, behavior normal, pleasant  Breast: defer Gyn: defer Rectal: defer   Medicare Attestation I have personally reviewed: The patient's medical and social history Their use of alcohol, tobacco or illicit drugs Their current medications and supplements The patient's functional ability including ADLs,fall risks, home safety risks, cognitive, and hearing and visual impairment Diet and physical activities Evidence for depression or mood disorders  The patient's weight, height, BMI, and visual acuity have been recorded in the chart.  I have made referrals, counseling, and provided education to the patient based on review of the above and I have provided the patient with a written personalized care plan for preventive services.     Vicie Mutters, PA-C   06/08/2016

## 2016-06-09 LAB — CBC WITH DIFFERENTIAL/PLATELET
BASOS ABS: 0 {cells}/uL (ref 0–200)
Basophils Relative: 0 %
Eosinophils Absolute: 204 cells/uL (ref 15–500)
Eosinophils Relative: 3 %
HEMATOCRIT: 41.8 % (ref 35.0–45.0)
Hemoglobin: 13.5 g/dL (ref 11.7–15.5)
LYMPHS PCT: 26 %
Lymphs Abs: 1768 cells/uL (ref 850–3900)
MCH: 29.9 pg (ref 27.0–33.0)
MCHC: 32.3 g/dL (ref 32.0–36.0)
MCV: 92.7 fL (ref 80.0–100.0)
MONO ABS: 748 {cells}/uL (ref 200–950)
MPV: 10.6 fL (ref 7.5–12.5)
Monocytes Relative: 11 %
NEUTROS PCT: 60 %
Neutro Abs: 4080 cells/uL (ref 1500–7800)
Platelets: 338 10*3/uL (ref 140–400)
RBC: 4.51 MIL/uL (ref 3.80–5.10)
RDW: 15.5 % — AB (ref 11.0–15.0)
WBC: 6.8 10*3/uL (ref 3.8–10.8)

## 2016-06-09 LAB — TSH: TSH: 1.07 mIU/L

## 2016-06-09 LAB — LIPID PANEL
CHOLESTEROL: 124 mg/dL (ref <200)
HDL: 68 mg/dL (ref >50)
LDL CALC: 40 mg/dL (ref <100)
TRIGLYCERIDES: 81 mg/dL (ref <150)
Total CHOL/HDL Ratio: 1.8 Ratio (ref <5.0)
VLDL: 16 mg/dL (ref <30)

## 2016-06-09 LAB — HEPATIC FUNCTION PANEL
ALBUMIN: 4.2 g/dL (ref 3.6–5.1)
ALT: 12 U/L (ref 6–29)
AST: 15 U/L (ref 10–35)
Alkaline Phosphatase: 53 U/L (ref 33–130)
Bilirubin, Direct: 0.2 mg/dL (ref <=0.2)
Indirect Bilirubin: 0.4 mg/dL (ref 0.2–1.2)
TOTAL PROTEIN: 6.8 g/dL (ref 6.1–8.1)
Total Bilirubin: 0.6 mg/dL (ref 0.2–1.2)

## 2016-06-09 LAB — BASIC METABOLIC PANEL WITH GFR
BUN: 18 mg/dL (ref 7–25)
CALCIUM: 9.4 mg/dL (ref 8.6–10.4)
CO2: 27 mmol/L (ref 20–31)
Chloride: 104 mmol/L (ref 98–110)
Creat: 0.87 mg/dL (ref 0.60–0.93)
GFR, EST AFRICAN AMERICAN: 74 mL/min (ref >=60)
GFR, Est Non African American: 64 mL/min (ref >=60)
GLUCOSE: 81 mg/dL (ref 65–99)
Potassium: 5 mmol/L (ref 3.5–5.3)
Sodium: 136 mmol/L (ref 135–146)

## 2016-06-09 LAB — HEMOGLOBIN A1C
Hgb A1c MFr Bld: 5.4 % (ref <5.7)
MEAN PLASMA GLUCOSE: 108 mg/dL

## 2016-06-09 LAB — MAGNESIUM: MAGNESIUM: 2.1 mg/dL (ref 1.5–2.5)

## 2016-06-09 LAB — VITAMIN D 25 HYDROXY (VIT D DEFICIENCY, FRACTURES): VIT D 25 HYDROXY: 72 ng/mL (ref 30–100)

## 2016-06-09 NOTE — Progress Notes (Signed)
LVM for pt to return office call for LAB results.

## 2016-06-10 NOTE — Progress Notes (Signed)
Pt aware of lab results & voiced understanding of those results.

## 2016-06-22 ENCOUNTER — Encounter: Payer: Self-pay | Admitting: Internal Medicine

## 2016-06-28 ENCOUNTER — Encounter: Payer: Self-pay | Admitting: Internal Medicine

## 2016-06-28 ENCOUNTER — Ambulatory Visit (INDEPENDENT_AMBULATORY_CARE_PROVIDER_SITE_OTHER): Payer: PPO | Admitting: Internal Medicine

## 2016-06-28 VITALS — BP 126/76 | HR 76 | Temp 97.8°F | Resp 16 | Ht 63.0 in | Wt 142.8 lb

## 2016-06-28 DIAGNOSIS — Z0000 Encounter for general adult medical examination without abnormal findings: Secondary | ICD-10-CM | POA: Diagnosis not present

## 2016-06-28 DIAGNOSIS — Z1211 Encounter for screening for malignant neoplasm of colon: Secondary | ICD-10-CM

## 2016-06-28 DIAGNOSIS — Z1212 Encounter for screening for malignant neoplasm of rectum: Secondary | ICD-10-CM

## 2016-06-28 DIAGNOSIS — Z79899 Other long term (current) drug therapy: Secondary | ICD-10-CM

## 2016-06-28 DIAGNOSIS — I2583 Coronary atherosclerosis due to lipid rich plaque: Secondary | ICD-10-CM

## 2016-06-28 DIAGNOSIS — E782 Mixed hyperlipidemia: Secondary | ICD-10-CM

## 2016-06-28 DIAGNOSIS — E559 Vitamin D deficiency, unspecified: Secondary | ICD-10-CM | POA: Diagnosis not present

## 2016-06-28 DIAGNOSIS — I10 Essential (primary) hypertension: Secondary | ICD-10-CM

## 2016-06-28 DIAGNOSIS — H00011 Hordeolum externum right upper eyelid: Secondary | ICD-10-CM

## 2016-06-28 DIAGNOSIS — K219 Gastro-esophageal reflux disease without esophagitis: Secondary | ICD-10-CM

## 2016-06-28 DIAGNOSIS — Z136 Encounter for screening for cardiovascular disorders: Secondary | ICD-10-CM | POA: Diagnosis not present

## 2016-06-28 DIAGNOSIS — I2510 Atherosclerotic heart disease of native coronary artery without angina pectoris: Secondary | ICD-10-CM

## 2016-06-28 DIAGNOSIS — G894 Chronic pain syndrome: Secondary | ICD-10-CM

## 2016-06-28 DIAGNOSIS — R7303 Prediabetes: Secondary | ICD-10-CM | POA: Diagnosis not present

## 2016-06-28 LAB — CBC WITH DIFFERENTIAL/PLATELET
BASOS ABS: 0 {cells}/uL (ref 0–200)
Basophils Relative: 0 %
EOS PCT: 1 %
Eosinophils Absolute: 92 cells/uL (ref 15–500)
HCT: 42.8 % (ref 35.0–45.0)
Hemoglobin: 14 g/dL (ref 11.7–15.5)
Lymphocytes Relative: 21 %
Lymphs Abs: 1932 cells/uL (ref 850–3900)
MCH: 30.5 pg (ref 27.0–33.0)
MCHC: 32.7 g/dL (ref 32.0–36.0)
MCV: 93.2 fL (ref 80.0–100.0)
MONOS PCT: 8 %
MPV: 10.4 fL (ref 7.5–12.5)
Monocytes Absolute: 736 cells/uL (ref 200–950)
NEUTROS ABS: 6440 {cells}/uL (ref 1500–7800)
Neutrophils Relative %: 70 %
PLATELETS: 358 10*3/uL (ref 140–400)
RBC: 4.59 MIL/uL (ref 3.80–5.10)
RDW: 15 % (ref 11.0–15.0)
WBC: 9.2 10*3/uL (ref 3.8–10.8)

## 2016-06-28 MED ORDER — NEOMYCIN-POLYMYXIN-DEXAMETH 3.5-10000-0.1 OP OINT: TOPICAL_OINTMENT | OPHTHALMIC | 3 refills | Status: DC

## 2016-06-28 NOTE — Patient Instructions (Signed)
Preventive Care for Adults  A healthy lifestyle and preventive care can promote health and wellness. Preventive health guidelines for women include the following key practices.  A routine yearly physical is a good way to check with your health care provider about your health and preventive screening. It is a chance to share any concerns and updates on your health and to receive a thorough exam.  Visit your dentist for a routine exam and preventive care every 6 months. Brush your teeth twice a day and floss once a day. Good oral hygiene prevents tooth decay and gum disease.  The frequency of eye exams is based on your age, health, family medical history, use of contact lenses, and other factors. Follow your health care provider's recommendations for frequency of eye exams.  Eat a healthy diet. Foods like vegetables, fruits, whole grains, low-fat dairy products, and lean protein foods contain the nutrients you need without too many calories. Decrease your intake of foods high in solid fats, added sugars, and salt. Eat the right amount of calories for you.Get information about a proper diet from your health care provider, if necessary.  Regular physical exercise is one of the most important things you can do for your health. Most adults should get at least 150 minutes of moderate-intensity exercise (any activity that increases your heart rate and causes you to sweat) each week. In addition, most adults need muscle-strengthening exercises on 2 or more days a week.  Maintain a healthy weight. The body mass index (BMI) is a screening tool to identify possible weight problems. It provides an estimate of body fat based on height and weight. Your health care provider can find your BMI and can help you achieve or maintain a healthy weight.For adults 20 years and older:  A BMI below 18.5 is considered underweight.  A BMI of 18.5 to 24.9 is normal.  A BMI of 25 to 29.9 is considered overweight.  A BMI of  30 and above is considered obese.  Maintain normal blood lipids and cholesterol levels by exercising and minimizing your intake of saturated fat. Eat a balanced diet with plenty of fruit and vegetables. If your lipid or cholesterol levels are high, you are over 50, or you are at high risk for heart disease, you may need your cholesterol levels checked more frequently.Ongoing high lipid and cholesterol levels should be treated with medicines if diet and exercise are not working.  If you smoke, find out from your health care provider how to quit. If you do not use tobacco, do not start.  Lung cancer screening is recommended for adults aged 52-80 years who are at high risk for developing lung cancer because of a history of smoking. A yearly low-dose CT scan of the lungs is recommended for people who have at least a 30-pack-year history of smoking and are a current smoker or have quit within the past 15 years. A pack year of smoking is smoking an average of 1 pack of cigarettes a day for 1 year (for example: 1 pack a day for 30 years or 2 packs a day for 15 years). Yearly screening should continue until the smoker has stopped smoking for at least 15 years. Yearly screening should be stopped for people who develop a health problem that would prevent them from having lung cancer treatment.  Avoid use of street drugs. Do not share needles with anyone. Ask for help if you need support or instructions about stopping the use of drugs.  High  blood pressure causes heart disease and increases the risk of stroke.  Ongoing high blood pressure should be treated with medicines if weight loss and exercise do not work.  If you are 55-79 years old, ask your health care provider if you should take aspirin to prevent strokes.  Diabetes screening involves taking a blood sample to check your fasting blood sugar level. This should be done once every 3 years, after age 45, if you are within normal weight and without risk  factors for diabetes. Testing should be considered at a younger age or be carried out more frequently if you are overweight and have at least 1 risk factor for diabetes.  Breast cancer screening is essential preventive care for women. You should practice "breast self-awareness." This means understanding the normal appearance and feel of your breasts and may include breast self-examination. Any changes detected, no matter how small, should be reported to a health care provider. Women in their 20s and 30s should have a clinical breast exam (CBE) by a health care provider as part of a regular health exam every 1 to 3 years. After age 40, women should have a CBE every year. Starting at age 40, women should consider having a mammogram (breast X-ray test) every year. Women who have a family history of breast cancer should talk to their health care provider about genetic screening. Women at a high risk of breast cancer should talk to their health care providers about having an MRI and a mammogram every year.  Breast cancer gene (BRCA)-related cancer risk assessment is recommended for women who have family members with BRCA-related cancers. BRCA-related cancers include breast, ovarian, tubal, and peritoneal cancers. Having family members with these cancers may be associated with an increased risk for harmful changes (mutations) in the breast cancer genes BRCA1 and BRCA2. Results of the assessment will determine the need for genetic counseling and BRCA1 and BRCA2 testing.  Routine pelvic exams to screen for cancer are no longer recommended for nonpregnant women who are considered low risk for cancer of the pelvic organs (ovaries, uterus, and vagina) and who do not have symptoms. Ask your health care provider if a screening pelvic exam is right for you.  If you have had past treatment for cervical cancer or a condition that could lead to cancer, you need Pap tests and screening for cancer for at least 20 years after  your treatment. If Pap tests have been discontinued, your risk factors (such as having a new sexual partner) need to be reassessed to determine if screening should be resumed. Some women have medical problems that increase the chance of getting cervical cancer. In these cases, your health care provider may recommend more frequent screening and Pap tests.    Colorectal cancer can be detected and often prevented. Most routine colorectal cancer screening begins at the age of 50 years and continues through age 75 years. However, your health care provider may recommend screening at an earlier age if you have risk factors for colon cancer. On a yearly basis, your health care provider may provide home test kits to check for hidden blood in the stool. Use of a small camera at the end of a tube, to directly examine the colon (sigmoidoscopy or colonoscopy), can detect the earliest forms of colorectal cancer. Talk to your health care provider about this at age 50, when routine screening begins. Direct exam of the colon should be repeated every 5-10 years through age 75 years, unless early forms of pre-cancerous   polyps or small growths are found.  Osteoporosis is a disease in which the bones lose minerals and strength with aging. This can result in serious bone fractures or breaks. The risk of osteoporosis can be identified using a bone density scan. Women ages 68 years and over and women at risk for fractures or osteoporosis should discuss screening with their health care providers. Ask your health care provider whether you should take a calcium supplement or vitamin D to reduce the rate of osteoporosis.  Menopause can be associated with physical symptoms and risks. Hormone replacement therapy is available to decrease symptoms and risks. You should talk to your health care provider about whether hormone replacement therapy is right for you.  Use sunscreen. Apply sunscreen liberally and repeatedly throughout the day.  You should seek shade when your shadow is shorter than you. Protect yourself by wearing long sleeves, pants, a wide-brimmed hat, and sunglasses year round, whenever you are outdoors.  Once a month, do a whole body skin exam, using a mirror to look at the skin on your back. Tell your health care provider of new moles, moles that have irregular borders, moles that are larger than a pencil eraser, or moles that have changed in shape or color.  Stay current with required vaccines (immunizations).  Influenza vaccine. All adults should be immunized every year.  Tetanus, diphtheria, and acellular pertussis (Td, Tdap) vaccine. Pregnant women should receive 1 dose of Tdap vaccine during each pregnancy. The dose should be obtained regardless of the length of time since the last dose. Immunization is preferred during the 27th-36th week of gestation. An adult who has not previously received Tdap or who does not know her vaccine status should receive 1 dose of Tdap. This initial dose should be followed by tetanus and diphtheria toxoids (Td) booster doses every 10 years. Adults with an unknown or incomplete history of completing a 3-dose immunization series with Td-containing vaccines should begin or complete a primary immunization series including a Tdap dose. Adults should receive a Td booster every 10 years.    Zoster vaccine. One dose is recommended for adults aged 36 years or older unless certain conditions are present.    Pneumococcal 13-valent conjugate (PCV13) vaccine. When indicated, a person who is uncertain of her immunization history and has no record of immunization should receive the PCV13 vaccine. An adult aged 1 years or older who has certain medical conditions and has not been previously immunized should receive 1 dose of PCV13 vaccine. This PCV13 should be followed with a dose of pneumococcal polysaccharide (PPSV23) vaccine. The PPSV23 vaccine dose should be obtained at least 8 weeks after the  dose of PCV13 vaccine. An adult aged 73 years or older who has certain medical conditions and previously received 1 or more doses of PPSV23 vaccine should receive 1 dose of PCV13. The PCV13 vaccine dose should be obtained 1 or more years after the last PPSV23 vaccine dose.    Pneumococcal polysaccharide (PPSV23) vaccine. When PCV13 is also indicated, PCV13 should be obtained first. All adults aged 47 years and older should be immunized. An adult younger than age 86 years who has certain medical conditions should be immunized. Any person who resides in a nursing home or long-term care facility should be immunized. An adult smoker should be immunized. People with an immunocompromised condition and certain other conditions should receive both PCV13 and PPSV23 vaccines. People with human immunodeficiency virus (HIV) infection should be immunized as soon as possible after diagnosis. Immunization  during chemotherapy or radiation therapy should be avoided. Routine use of PPSV23 vaccine is not recommended for American Indians, [place]atives, or people younger than 65 years unless there are medical conditions that require PPSV23 vaccine. When indicated, people who have unknown immunization and have no record of immunization should receive PPSV23 vaccine. One-time revaccination 5 years after the first dose of PPSV23 is recommended for people aged 19-64 years who have chronic kidney failure, nephrotic syndrome, asplenia, or immunocompromised conditions. People who received 1-2 doses of PPSV23 before age 49 years should receive another dose of PPSV23 vaccine at age 71 years or later if at least 5 years have passed since the previous dose. Doses of PPSV23 are not needed for people immunized with PPSV23 at or after age 23 years.   Preventive Services / Frequency  Ages 9 years and over  Blood pressure check.  Lipid and cholesterol check.  Lung cancer screening. / Every year if you are aged 70-80 years and have a  30-pack-year history of smoking and currently smoke or have quit within the past 15 years. Yearly screening is stopped once you have quit smoking for at least 15 years or develop a health problem that would prevent you from having lung cancer treatment.  Clinical breast exam.** / Every year after age 46 years.  BRCA-related cancer risk assessment.** / For women who have family members with a BRCA-related cancer (breast, ovarian, tubal, or peritoneal cancers).  Mammogram.** / Every year beginning at age 38 years and continuing for as long as you are in good health. Consult with your health care provider.  Pap test.** / Every 3 years starting at age 33 years through age 29 or 46 years with 3 consecutive normal Pap tests. Testing can be stopped between 65 and 70 years with 3 consecutive normal Pap tests and no abnormal Pap or HPV tests in the past 10 years.  Fecal occult blood test (FOBT) of stool. / Every year beginning at age 55 years and continuing until age 57 years. You may not need to do this test if you get a colonoscopy every 10 years.  Flexible sigmoidoscopy or colonoscopy.** / Every 5 years for a flexible sigmoidoscopy or every 10 years for a colonoscopy beginning at age 43 years and continuing until age 13 years.  Hepatitis C blood test.** / For all people born from [birth year] through 1963 and any individual with known risks for hepatitis C.  Osteoporosis screening.** / A one-time screening for women ages 55 years and over and women at risk for fractures or osteoporosis.  Skin self-exam. / Monthly.  Influenza vaccine. / Every year.  Tetanus, diphtheria, and acellular pertussis (Tdap/Td) vaccine.** / 1 dose of Td every 10 years.  Zoster vaccine.** / 1 dose for adults aged 53 years or older.  Pneumococcal 13-valent conjugate (PCV13) vaccine.** / Consult your health care provider.  Pneumococcal polysaccharide (PPSV23) vaccine.** / 1 dose for all adults aged 54 years and older. Screening  for abdominal aortic aneurysm (AAA)  by ultrasound is recommended for people who have history of high blood pressure or who are current or former smokers. ++++++++++++++++++++ Recommend Adult Low Dose Aspirin or  coated  Aspirin 81 mg daily  To reduce risk of Colon Cancer 20 %,  Skin Cancer 26 % ,  Melanoma 46%  and  Pancreatic cancer 60% ++++++++++++++++++++ Vitamin D goal  is between 70-100.  Please make sure that you are taking your Vitamin D as directed.  It is very important as  a natural anti-inflammatory  helping hair, skin, and nails, as well as reducing stroke and heart attack risk.  It helps your bones and helps with mood. It also decreases numerous cancer risks so please take it as directed.  Low Vit D is associated with a 200-300% higher risk for CANCER  and 200-300% higher risk for HEART   ATTACK  &  STROKE.   .....................................[name] It is also associated with higher death rate at younger ages,  autoimmune diseases like Rheumatoid arthritis, Lupus, Multiple Sclerosis.    Also many other serious conditions, like depression, Alzheimer's Dementia, infertility, muscle aches, fatigue, fibromyalgia - just to name a few. ++++++++++++++++++ Recommend the book "The END of DIETING" by Dr Excell Seltzer  & the book "The END of DIABETES " by Dr Excell Seltzer At [email or web address] - get book & Audio CD's    Being diabetic has a  300% increased risk for heart attack, stroke, cancer, and alzheimer- type vascular dementia. It is very important that you work harder with diet by avoiding all foods that are white. Avoid white rice (brown & wild rice is OK), white potatoes (sweetpotatoes in moderation is OK), White bread or wheat bread or anything made out of white flour like bagels, donuts, rolls, buns, biscuits, cakes, pastries, cookies, pizza crust, and pasta (made from white flour & egg whites) - vegetarian pasta or spinach or wheat pasta is OK. Multigrain breads like Arnold's or  [employer], or multigrain sandwich thins or flatbreads.  Diet, exercise and weight loss can reverse and cure diabetes in the early stages.  Diet, exercise and weight loss is very important in the control and prevention of complications of diabetes which affects every system in your body, ie. Brain - dementia/stroke, eyes - glaucoma/blindness, heart - heart attack/heart failure, kidneys - dialysis, stomach - gastric paralysis, intestines - malabsorption, nerves - severe painful neuritis, circulation - gangrene & loss of a leg(s), and finally cancer and Alzheimers.    I recommend avoid fried & greasy foods,  sweets/candy, white rice (brown or wild rice or Quinoa is OK), white potatoes (sweet potatoes are OK) - anything made from white flour - bagels, doughnuts, rolls, buns, biscuits,white and wheat breads, pizza crust and traditional pasta made of white flour & egg white(vegetarian pasta or spinach or wheat pasta is OK).  Multi-grain bread is OK - like multi-grain flat bread or sandwich thins. Avoid alcohol in excess. Exercise is also important.    Eat all the vegetables you want - avoid meat, especially red meat and dairy - especially cheese.  Cheese is the most concentrated form of trans-fats which is the worst thing to clog up our arteries. Veggie cheese is OK which can be found in the fresh produce section at [employer] or [employer] or [employer]  +++++++++++++++++++ DASH Eating Plan  DASH stands for "Dietary Approaches to Stop Hypertension."   The DASH eating plan is a healthy eating plan that has been shown to reduce high blood pressure (hypertension). Additional health benefits may include reducing the risk of type 2 diabetes mellitus, heart disease, and stroke. The DASH eating plan may also help with weight loss. WHAT DO I NEED TO KNOW ABOUT THE DASH EATING PLAN? For the DASH eating plan, you will follow these general guidelines:  Choose foods with a percent daily value for sodium of  less than 5% (as listed on the food label).  Use salt-free seasonings or herbs instead of table salt or sea salt.  Check with your  health care provider or pharmacist before using salt substitutes.  Eat lower-sodium products, often labeled as "lower sodium" or "no salt added."  Eat fresh foods.  Eat more vegetables, fruits, and low-fat dairy products.  Choose whole grains. Look for the word "whole" as the first word in the ingredient list.  Choose fish   Limit sweets, desserts, sugars, and sugary drinks.  Choose heart-healthy fats.  Eat veggie cheese   Eat more home-cooked food and less restaurant, buffet, and fast food.  Limit fried foods.  Cook foods using methods other than frying.  Limit canned vegetables. If you do use them, rinse them well to decrease the sodium.  When eating at a restaurant, ask that your food be prepared with less salt, or no salt if possible.                      WHAT FOODS CAN I EAT? Read Dr Fara Olden Fuhrman's books on The End of Dieting & The End of Diabetes  Grains Whole grain or whole wheat bread. Brown rice. Whole grain or whole wheat pasta. Quinoa, bulgur, and whole grain cereals. Low-sodium cereals. Corn or whole wheat flour tortillas. Whole grain cornbread. Whole grain crackers. Low-sodium crackers.  Vegetables Fresh or frozen vegetables (raw, steamed, roasted, or grilled). Low-sodium or reduced-sodium tomato and vegetable juices. Low-sodium or reduced-sodium tomato sauce and paste. Low-sodium or reduced-sodium canned vegetables.   Fruits All fresh, canned (in natural juice), or frozen fruits.  Protein Products  All fish and seafood.  Dried beans, peas, or lentils. Unsalted nuts and seeds. Unsalted canned beans.  Dairy Low-fat dairy products, such as skim or 1% milk, 2% or reduced-fat cheeses, low-fat ricotta or cottage cheese, or plain low-fat yogurt. Low-sodium or reduced-sodium cheeses.  Fats and Oils Tub margarines without trans  fats. Light or reduced-fat mayonnaise and salad dressings (reduced sodium). Avocado. Safflower, olive, or canola oils. Natural peanut or almond butter.  Other Unsalted popcorn and pretzels. The items listed above may not be a complete list of recommended foods or beverages. Contact your dietitian for more options.  +++++++++++++++  WHAT FOODS ARE NOT RECOMMENDED? Grains/ White flour or wheat flour White bread. White pasta. White rice. Refined cornbread. Bagels and croissants. Crackers that contain trans fat.  Vegetables  Creamed or fried vegetables. Vegetables in a . Regular canned vegetables. Regular canned tomato sauce and paste. Regular tomato and vegetable juices.  Fruits Dried fruits. Canned fruit in light or heavy syrup. Fruit juice.  Meat and Other Protein Products Meat in general - RED meat & White meat.  Fatty cuts of meat. Ribs, chicken wings, all processed meats as bacon, sausage, bologna, salami, fatback, hot dogs, bratwurst and packaged luncheon meats.  Dairy Whole or 2% milk, cream, half-and-half, and cream cheese. Whole-fat or sweetened yogurt. Full-fat cheeses or blue cheese. Non-dairy creamers and whipped toppings. Processed cheese, cheese spreads, or cheese curds.  Condiments Onion and garlic salt, seasoned salt, table salt, and sea salt. Canned and packaged gravies. Worcestershire sauce. Tartar sauce. Barbecue sauce. Teriyaki sauce. Soy sauce, including reduced sodium. Steak sauce. Fish sauce. Oyster sauce. Cocktail sauce. Horseradish. Ketchup and mustard. Meat flavorings and tenderizers. Bouillon cubes. Hot sauce. Tabasco sauce. Marinades. Taco seasonings. Relishes.  Fats and Oils Butter, stick margarine, lard, shortening and bacon fat. Coconut, palm kernel, or palm oils. Regular salad dressings.  Pickles and olives. Salted popcorn and pretzels.  The items listed above may not be a complete list of foods and beverages to  avoid.

## 2016-06-28 NOTE — Progress Notes (Addendum)
[facility] [name], M.D.    Uvaldo Bristle. Silverio Lay, P.A.-C      Starlyn Skeans, P.A.-C  [facility]                [address] [place], [place] [id number] Telephone [phone] Telefax [phone]  Comprehensive Evaluation &  Examination     This very nice 81 y.o. MWF presents for a  comprehensive evaluation and management of multiple medical co-morbidities.  Patient has been followed for HTN, PreDiabetes/T2_NIDDM, Hyperlipidemia and Vitamin D Deficiency. Patient also has been followed by Dr Melvyn Novas for COPD.      Patient has hx/o GERD and Lap Nissen Rpr and currently is stable on her meds and with prudent diet.      Patient has Chronic Pain Syndrome from DJD/DDD beginning with Lumbar Disc surg in 2014 and then in Jan 2017 had a Lumbar Decompression and more recently had a Lumbar fusion in July 2017 by Dr Louanne Skye. Patient has hx/o Lumbar Compression fx's. Patient's Opioids are managed by Dr Nicholaus Bloom for her Opioids.      HTN predates since 60. Patient's BP has been controlled. In 2003, she presented with Angina and had PCA/Stents.  In 2013, she had a low risk Myoview. Patient denies any cardiac symptoms as chest pain, palpitations, shortness of breath, dizziness or ankle swelling. Today's BP is at goal - 126/76.      Patient's hyperlipidemia is controlled with diet and medications. Patient denies myalgias or other medication SE's. Last lipids were at goal: Lab Results  Component Value Date   CHOL 124 06/08/2016   HDL 68 06/08/2016   LDLCALC 40 06/08/2016   TRIG 81 06/08/2016   CHOLHDL 1.8 06/08/2016      Patient has hx/o preDiabetes (A1c 6.1% in 2013) and she was started on Metformin in the hospital in Nov 2016 and is only taking 1/2 tab Metformin daily and A1c's have been essentially normal with stable weights. She denies any reactive hypoglycemic symptoms, visual blurring, diabetic polys, or  paresthesias. Last A1c was at goal: Lab Results  Component Value Date   HGBA1C 5.4 06/08/2016      Finally, patient has history of Vitamin D Deficiency ("25" in 2008) and last Vitamin D was at goal: Lab Results  Component Value Date   VD25OH 72 06/08/2016   Current Outpatient Prescriptions on File Prior to Visit  Medication Sig  . acetaminophen (TYLENOL) 325 MG tablet Take 2 tablets (650 mg total) by mouth every 6 (six) hours as needed for mild pain (or Fever >/= 101).  [name] albuterol (PROVENTIL HFA;VENTOLIN HFA) 108 (90 BASE) MCG/ACT inhaler Inhale 1-2 puffs into the lungs every 6 (six) hours as needed for wheezing or shortness of breath.  . ALPRAZolam  0.5 MG tablet TAKE 1/2-1 TAB TWO TO THREE TIMES DAILY FOR NERVES  . amLODipine  10 MG tablet Take 1 tab at bedtime.  [name] VITAMIN D 2000  TAB Take 6,000 Units by mouth daily.   [name] FLONASE nasal spray Place 2 sprays into both nostrils at bedtime.  .  DUONEB (3) MG/3ML SOLN USE ONE VIAL IN NEBULIZER 4 TIMES DAILY   . losartan  100 MG tablet TAKE ONE TABLET BY MOUTH ONCE DAILY ( DISCONTINUE BENICAR)  . MAG-OX  400 MG tablet Take 400 mg by mouth daily.  . meclizine 25 MG tablet 1/2-1 pill  up to 3 times daily for motion sickness/dizziness  . metFORMIN-XR 500 MG 2 TAKE 1-2 TAB TWICE DAILY - only taking 1/2 tab qd  . metoprolol tartrate  50 MG TAKE ONE TAB TWICE DAILY FOR BLOOD PRESSURE  . DULERA 200-5 Inhale 2 puffs into the lungs 2 (two) times daily.   [name] NITROSTAT 0.4 MG SL  Place 1 tablet (0.4 mg total) under the tongue every 5 (five) minutes as needed for chest pain.  [name] ondansetron-ODT 8 MG           DISSOLVE 1 TAB EVERY 8 HOURS AS NEEDED FOR NAUSEA   . orphenadrine  100 MG  Take 1 tablet (100 mg total) by mouth 2 (two) times daily as needed for muscle spasms.  . Coral Calcium 1000mg  Take2 (two) times daily.   [name] oxyCODONE (OXY IR/ROXICODONE) 5 MG immediate release  Take 1 tab every 4 (four) hours as needed for severe pain or breakthrough pain.  (Patient taking  Take every 6  hrs as needed for severe pain  . pantoprazole  40 MG Take 1 tab daily.  [name] MIRALAXpacket Take 17 g by mouth 2  times daily.  [name] K-DUR 20 MEQ  Take 1 tablet (20 mEq total) by mouth 2 (two) times daily.  . pravastatin  40 MG  Take 1 tablet (40 mg total) by mouth daily.  . promethazin 25 MG  Take 1 tab every 6 (six) hours as needed for nausea   . SENOKOT-S 8.6-50 MG Take 2 tablets by mouth 2 (two) times daily.  . silodosin  8 MG CAPS  Take 8 mg by mouth every other day.   . sucralfate  1 g  TAKE ONE TAB 4 TIMES DAILY    Allergies  Allergen Reactions  . Ace Inhibitors Cough    Per Dr Melvyn Novas pulmonology 2013  . Aspartame Diarrhea and Nausea And Vomiting  . Atorvastatin Other (See Comments)    Muscle aches  . Biaxin [Clarithromycin] Other (See Comments)    PATIENT CAN TOLERATE Z-PAK.  Is written on patient's paper chart.  [name] [Roflumilast] Nausea And Vomiting  . Erythromycin Other (See Comments)    PATIENT CAN TOLERATE Z-PAK.  It is written on patient's paper chart.  . Levofloxacin Nausea And Vomiting   Past Medical History:  Diagnosis Date  . Anxiety    takes Xanax daily as needed  . Asthma    Albuterol daily as needed  . CAD (coronary artery disease)    LHC 2003 with 40% pLAD, 30% mLAD, 100% D1 (moderate vessel), 100%  D2 (small vessel), 95% mid small OM2.  Pt had PTCA to D1;  D2 and OM2 small vessels and tx medically;  Last Myoview (2012): anterior infarct seen with scar, no ischemia. EF normal. Pt managed medically;  Lexiscan Myoview (12/14):  Low risk; ant scar with very mild peri-infarct ischemia; EF 55% with ant AK   . Chronic back pain    HNP, DDD, spinal stenosis, vertebral compression fractures.   . Chronic nausea    takes Zofran daily as needed.  normal gastric emptying study 03/2013  . Constipation    felt to be functional. takes Senokot and Miralax daily.  treated with Linzess in past.   . DIVERTICULOSIS, COLON 08/28/2008   Qualifier:  Diagnosis of  By: [name], [name], [name]    . Elevated LFTs 2015   probably med induced DILI, from Augmentin vs Pravastatin  . GERD (gastroesophageal reflux disease)    hx of  esophageal stricture   . Hiatal hernia    Paraesophageal hernias. s/p fundoplications in 0000000 and 04/2013  . History of bronchitis several of yrs ago  . History of colon polyps 03/2009, 03/2011   adenomatous colon polyps, no high grade dysplasia.   [name] History of vertigo    no meds  . Hyperlipidemia    takes Pravastatin daily  . Hypertension    takes Amlodipine,Metoprolol,and Losartan daily  . Nausea 03/2016  . Osteoporosis   . PONV (postoperative nausea and vomiting)    yrs ago  . Slow urinary stream    takes Rapaflo daily  . TIA (transient ischemic attack) 1995   no residual effects noted   Health Maintenance  Topic Date Due  . COLONOSCOPY  04/06/2016  . TETANUS/TDAP  04/24/2024  . INFLUENZA VACCINE  Completed  . DEXA SCAN  Completed  . PNA vac Low Risk Adult  Completed   Immunization History  Administered Date(s) Administered  . DT 05/06/2014  . Influenza Split 02/07/2011  . Influenza Whole 02/09/2009, 01/19/2010, 02/13/2012  . Influenza, High Dose Seasonal PF 03/22/2013, 01/15/2014, 02/27/2015, 01/14/2016  . Pneumococcal Conjugate-13 05/06/2014  . Pneumococcal Polysaccharide-23 02/09/2011  . Pneumococcal-Unspecified 02/06/2010, 02/09/2012  . Td 05/10/2003   Past Surgical History:  Procedure Laterality Date  . APPENDECTOMY     patient unsure of date  . BACK SURGERY    . BLADDER REPAIR    . CHOLECYSTECTOMY     Patient unsure of date.  . CORONARY ANGIOPLASTY  11/16/2001   1 stent  . ESOPHAGEAL MANOMETRY N/A 03/25/2013   Procedure: ESOPHAGEAL MANOMETRY (EM);  Surgeon: Inda Castle, MD;  Location: [facility];  Service: Endoscopy;  Laterality: N/A;  . ESOPHAGOGASTRODUODENOSCOPY N/A 03/15/2013   Procedure: ESOPHAGOGASTRODUODENOSCOPY (EGD);  Surgeon: Jerene Bears, MD;  [facility] ENDOSCOPY;   Service: Endoscopy;  Laterality: N/A;  . ESOPHAGOGASTRODUODENOSCOPY N/A 12/25/2015   Procedure: ESOPHAGOGASTRODUODENOSCOPY (EGD);  Surgeon: Doran Stabler, MD;  Location: [facility] ENDOSCOPY;  Service: Endoscopy;  Laterality: N/A;  . EYE SURGERY  11/2000   bilateral cataracts with lens implant  . FIXATION KYPHOPLASTY LUMBAR SPINE  X 2   "L3-4"  . HEMORRHOID SURGERY    . HIATAL HERNIA REPAIR  06/03/2011   Procedure: LAPAROSCOPIC REPAIR OF HIATAL HERNIA;  Surgeon: Adin Hector, MD;  Location: [facility];  Service: General;  Laterality: N/A;  . HIATAL HERNIA REPAIR N/A 04/24/2013   Procedure: LAPAROSCOPIC  REPAIR RECURRENT PARASOPHAGEAL HIATAL HERNIA WITH FUNDOPLICATION;  Surgeon: Adin Hector, MD;  Location: [facility];  Service: General;  Laterality: N/A;  . INSERTION OF MESH N/A 04/24/2013   Procedure: INSERTION OF MESH;  Surgeon: Adin Hector, MD;  Location: [facility];  Service: General;  Laterality: N/A;  . LAPAROSCOPIC LYSIS OF ADHESIONS N/A 04/24/2013   Procedure: LAPAROSCOPIC LYSIS OF ADHESIONS;  Surgeon: Adin Hector, MD;  Location: [facility];  Service: General;  Laterality: N/A;  . LAPAROSCOPIC NISSEN FUNDOPLICATION  [id number]   Procedure: LAPAROSCOPIC NISSEN FUNDOPLICATION;  Surgeon: Adin Hector, MD;  Location: [facility];  Service: General;  Laterality: N/A;  . LUMBAR FUSION  12/07/2015   Right L2-3 facetectomy, posterolateral fusion L2-3 with pedicle screws, rods, local bone graft, Vivigen cancellous chips. Fusion extended to the L1 level with pedicle screws and rods  . LUMBAR LAMINECTOMY/DECOMPRESSION MICRODISCECTOMY Right 06/26/2012   Procedure: LUMBAR LAMINECTOMY/DECOMPRESSION MICRODISCECTOMY;  Surgeon: Jessy Oto, MD;  Location: [place];  Service: Orthopedics;  Laterality: Right;  RIGHT L4-5 MICRODISCECTOMY  .  LUMBAR LAMINECTOMY/DECOMPRESSION MICRODISCECTOMY N/A 05/29/2015   Procedure: RIGHT L2-3 MICRODISCECTOMY;  Surgeon: Jessy Oto, MD;  Location: [place];  Service: Orthopedics;   Laterality: N/A;  . TOTAL ABDOMINAL HYSTERECTOMY  1975   partial   Family History  Problem Relation Age of Onset  . Colon cancer Mother 58  . Heart disease Father 34    heart attack   Social History  Substance Use Topics  . Smoking status: Never Smoker  . Smokeless tobacco: Never Used  . Alcohol use No    ROS Constitutional: Denies fever, chills, weight loss/gain, headaches, insomnia,  night sweats, and change in appetite. Does c/o fatigue. Eyes: Denies redness, blurred vision, diplopia, discharge, itchy, watery eyes.  ENT: Denies discharge, congestion, post nasal drip, epistaxis, sore throat, earache, hearing loss, dental pain, Tinnitus, Vertigo, Sinus pain, snoring.  Cardio: Denies chest pain, palpitations, irregular heartbeat, syncope, dyspnea, diaphoresis, orthopnea, PND, claudication, edema Respiratory: denies cough, dyspnea, DOE, pleurisy, hoarseness, laryngitis, wheezing.  Gastrointestinal: Denies dysphagia, heartburn, reflux, water brash, pain, cramps, nausea, vomiting, bloating, diarrhea, constipation, hematemesis, melena, hematochezia, jaundice, hemorrhoids Genitourinary: Denies dysuria, frequency, urgency, nocturia, hesitancy, discharge, hematuria, flank pain Breast: Breast lumps, nipple discharge, bleeding.  Musculoskeletal: Denies arthralgia, myalgia, stiffness, Jt. Swelling, pain, limp, and strain/sprain. Denies falls. Skin: Denies puritis, rash, hives, warts, acne, eczema, changing in skin lesion Neuro: No weakness, tremor, incoordination, spasms, paresthesia, pain Psychiatric: Denies confusion, memory loss, sensory loss. Denies Depression. Endocrine: Denies change in weight, skin, hair change, nocturia, and paresthesia, diabetic polys, visual blurring, hyper / hypo glycemic episodes.  Heme/Lymph: No excessive bleeding, bruising, enlarged lymph nodes.  Physical Exam  BP 126/76   Pulse 76   Temp 97.8 F (36.6 C)   Resp 16   Ht 5\' 3"  (1.6 m)   Wt 142 lb 12.8 oz  (64.8 kg)   BMI 25.30 kg/m   General Appearance: Well nourished and in no apparent distress.  Eyes: PERRLA, EOMs, conjunctiva no swelling or erythema, normal fundi and vessels. There is a small sty at the lower R eyelid.  Sinuses: No frontal/maxillary tenderness ENT/Mouth: EACs patent / TMs  nl. Nares clear without erythema, swelling, mucoid exudates. Oral hygiene is good. No erythema, swelling, or exudate. Tongue normal, non-obstructing. Tonsils not swollen or erythematous. Hearing normal.  Neck: Supple, thyroid normal. No bruits, nodes or JVD. Respiratory: Respiratory effort normal.  BS equal and clear bilateral without rales, rhonci, wheezing or stridor. Cardio: Heart sounds are normal with regular rate and rhythm and no murmurs, rubs or gallops. Peripheral pulses are normal and equal bilaterally without edema. No aortic or femoral bruits. Chest: symmetric with normal excursions and percussion. Breasts: Symmetric, without lumps, nipple discharge, retractions, or fibrocystic changes.  Abdomen: Flat, soft with bowel sounds active. Nontender, no guarding, rebound, hernias, masses, or organomegaly.  Lymphatics: Non tender without lymphadenopathy.  Musculoskeletal: Full ROM all peripheral extremities, joint stability, 5/5 strength, and normal gait. Skin: Warm and dry without rashes, lesions, cyanosis, clubbing or  ecchymosis.  Neuro: Cranial nerves intact, reflexes equal bilaterally. Normal muscle tone, no cerebellar symptoms. Sensation intact.  Pysch: Alert and oriented X 3, normal affect, Insight and Judgment appropriate.   Assessment and Plan  1. Annual Preventative Screening Examination   2. Essential hypertension  - Microalbumin / creatinine urine ratio - EKG 12-Lead - Urinalysis, Routine w reflex microscopic - CBC with Differential/Platelet - BASIC METABOLIC PANEL WITH GFR - TSH  3. Mixed hyperlipidemia  - EKG 12-Lead - Hepatic function panel - Lipid panel -  TSH  4.  Prediabetes  - advised d/c the 1/2 tab of Metformin and continue to monitor and call if CBG's> 160.  - EKG 12-Lead - Hemoglobin A1c - Insulin, random  5. Vitamin D deficiency  - VITAMIN D 25 Hydroxy   6. CAD s/p Stents x 2 in 2003  - EKG 12-Lead  7. Chronic pain syndrome   8. Gastroesophageal reflux disease   9. Screening for ischemic heart disease  - EKG 12-Lead  10. Medication management  - Urinalysis, Routine w reflex microscopic - CBC with Differential/Platelet - BASIC METABOLIC PANEL WITH GFR - Hepatic function panel - Magnesium - Lipid panel - TSH - Hemoglobin A1c - Insulin, random - VITAMIN D 25 Hydroxy   11. Screening for colorectal cancer  - POC Hemoccult Bld/Stl   12. Hordeolum externum of right upper eyelid  - neomycin-polymyxin b-dexamethasone (MAXITROL) 3.5-10000-0.1 OINT; Apply to stye 4 x / day  Dispense: 3.5 g; Refill: 3       Continue prudent diet as discussed, weight control, BP monitoring, regular exercise, and medications. Discussed med's effects and SE's. Screening labs and tests as requested with regular follow-up as recommended. Over 40 minutes of exam, counseling, chart review and high complex critical decision making was performed.

## 2016-06-29 ENCOUNTER — Other Ambulatory Visit: Payer: PPO

## 2016-06-29 LAB — BASIC METABOLIC PANEL WITH GFR
BUN: 17 mg/dL (ref 7–25)
CALCIUM: 9.5 mg/dL (ref 8.6–10.4)
CHLORIDE: 104 mmol/L (ref 98–110)
CO2: 23 mmol/L (ref 20–31)
CREATININE: 0.85 mg/dL (ref 0.60–0.93)
GFR, Est African American: 76 mL/min (ref >=60)
GFR, Est Non African American: 66 mL/min (ref >=60)
Glucose, Bld: 71 mg/dL (ref 65–99)
Potassium: 4.4 mmol/L (ref 3.5–5.3)
SODIUM: 139 mmol/L (ref 135–146)

## 2016-06-29 LAB — URINALYSIS, ROUTINE W REFLEX MICROSCOPIC
Bilirubin Urine: NEGATIVE
GLUCOSE, UA: NEGATIVE
HGB URINE DIPSTICK: NEGATIVE
LEUKOCYTES UA: NEGATIVE
NITRITE: NEGATIVE
PROTEIN: NEGATIVE
Specific Gravity, Urine: 1.022 (ref 1.001–1.035)
pH: 6 (ref 5.0–8.0)

## 2016-06-29 LAB — INSULIN, RANDOM: Insulin: 19.6 u[IU]/mL (ref 2.0–19.6)

## 2016-06-29 LAB — VITAMIN D 25 HYDROXY (VIT D DEFICIENCY, FRACTURES): VIT D 25 HYDROXY: 51 ng/mL (ref 30–100)

## 2016-06-29 LAB — HEPATIC FUNCTION PANEL
ALT: 11 U/L (ref 6–29)
AST: 16 U/L (ref 10–35)
Albumin: 4.1 g/dL (ref 3.6–5.1)
Alkaline Phosphatase: 59 U/L (ref 33–130)
BILIRUBIN TOTAL: 0.6 mg/dL (ref 0.2–1.2)
Bilirubin, Direct: 0.1 mg/dL (ref <=0.2)
Indirect Bilirubin: 0.5 mg/dL (ref 0.2–1.2)
Total Protein: 6.9 g/dL (ref 6.1–8.1)

## 2016-06-29 LAB — LIPID PANEL
CHOL/HDL RATIO: 2.1 ratio (ref <5.0)
CHOLESTEROL: 135 mg/dL (ref <200)
HDL: 63 mg/dL (ref >50)
LDL Cholesterol: 52 mg/dL (ref <100)
Triglycerides: 99 mg/dL (ref <150)
VLDL: 20 mg/dL (ref <30)

## 2016-06-29 LAB — TSH: TSH: 1.03 mIU/L

## 2016-06-29 LAB — HEMOGLOBIN A1C
Hgb A1c MFr Bld: 5.6 % (ref <5.7)
Mean Plasma Glucose: 114 mg/dL

## 2016-06-29 LAB — MICROALBUMIN / CREATININE URINE RATIO
Creatinine, Urine: 165 mg/dL (ref 20–320)
MICROALB UR: 2.4 mg/dL
Microalb Creat Ratio: 15 mcg/mg creat (ref <30)

## 2016-06-29 LAB — MAGNESIUM: MAGNESIUM: 1.8 mg/dL (ref 1.5–2.5)

## 2016-07-05 ENCOUNTER — Other Ambulatory Visit: Payer: PPO

## 2016-07-06 ENCOUNTER — Other Ambulatory Visit: Payer: PPO

## 2016-07-06 ENCOUNTER — Encounter: Payer: Self-pay | Admitting: Internal Medicine

## 2016-07-08 ENCOUNTER — Ambulatory Visit
Admission: RE | Admit: 2016-07-08 | Discharge: 2016-07-08 | Disposition: A | Discharge status: Home / Self Care | Payer: PPO | Source: Ambulatory Visit | Attending: Physician Assistant | Admitting: Physician Assistant

## 2016-07-08 DIAGNOSIS — Z780 Asymptomatic menopausal state: Secondary | ICD-10-CM | POA: Diagnosis not present

## 2016-07-08 DIAGNOSIS — M810 Age-related osteoporosis without current pathological fracture: Secondary | ICD-10-CM

## 2016-07-11 NOTE — Progress Notes (Signed)
Pt aware of lab results & voiced understanding of those results.

## 2016-07-13 DIAGNOSIS — Z79891 Long term (current) use of opiate analgesic: Secondary | ICD-10-CM | POA: Diagnosis not present

## 2016-07-13 DIAGNOSIS — M4726 Other spondylosis with radiculopathy, lumbar region: Secondary | ICD-10-CM | POA: Diagnosis not present

## 2016-07-13 DIAGNOSIS — M961 Postlaminectomy syndrome, not elsewhere classified: Secondary | ICD-10-CM | POA: Diagnosis not present

## 2016-07-13 DIAGNOSIS — G894 Chronic pain syndrome: Secondary | ICD-10-CM | POA: Diagnosis not present

## 2016-07-21 ENCOUNTER — Other Ambulatory Visit: Payer: Self-pay

## 2016-07-21 DIAGNOSIS — Z1211 Encounter for screening for malignant neoplasm of colon: Secondary | ICD-10-CM

## 2016-07-21 DIAGNOSIS — Z1212 Encounter for screening for malignant neoplasm of rectum: Principal | ICD-10-CM

## 2016-07-21 LAB — POC HEMOCCULT BLD/STL (HOME/3-CARD/SCREEN)
Card #2 Fecal Occult Blod, POC: NEGATIVE
FECAL OCCULT BLD: NEGATIVE
FECAL OCCULT BLD: NEGATIVE

## 2016-07-29 ENCOUNTER — Encounter (INDEPENDENT_AMBULATORY_CARE_PROVIDER_SITE_OTHER): Payer: Self-pay | Admitting: Specialist

## 2016-07-29 ENCOUNTER — Ambulatory Visit (INDEPENDENT_AMBULATORY_CARE_PROVIDER_SITE_OTHER): Payer: PPO | Admitting: Specialist

## 2016-07-29 ENCOUNTER — Ambulatory Visit (INDEPENDENT_AMBULATORY_CARE_PROVIDER_SITE_OTHER): Payer: PPO

## 2016-07-29 ENCOUNTER — Encounter (INDEPENDENT_AMBULATORY_CARE_PROVIDER_SITE_OTHER): Payer: Self-pay

## 2016-07-29 VITALS — BP 144/86 | HR 63 | Ht 65.0 in | Wt 138.0 lb

## 2016-07-29 DIAGNOSIS — Z981 Arthrodesis status: Secondary | ICD-10-CM | POA: Diagnosis not present

## 2016-07-29 DIAGNOSIS — M48062 Spinal stenosis, lumbar region with neurogenic claudication: Secondary | ICD-10-CM

## 2016-07-29 DIAGNOSIS — M4015 Other secondary kyphosis, thoracolumbar region: Secondary | ICD-10-CM

## 2016-07-29 DIAGNOSIS — T84498A Other mechanical complication of other internal orthopedic devices, implants and grafts, initial encounter: Secondary | ICD-10-CM | POA: Diagnosis not present

## 2016-07-29 DIAGNOSIS — M810 Age-related osteoporosis without current pathological fracture: Secondary | ICD-10-CM | POA: Diagnosis not present

## 2016-07-29 NOTE — Progress Notes (Signed)
Office Visit Note   Patient: [name] [name]           Date of Birth: 10-13-1935           MRN: [id number] Visit Date: 07/29/2016              Requested by: [name], MD [address] [place], [place] [address] PCP: Alesia Richards, MD   Assessment & Plan: Visit Diagnoses:  1. History of lumbar spinal fusion   2. Loosening of hardware in spine ([place])   3. Neurogenic claudication due to lumbar spinal stenosis   4. Age-related osteoporosis without current pathological fracture   5. Other secondary kyphosis, thoracolumbar region    Radiographs suggest loosening of hardware at L1 with pedicle screw loosening, increasing proximal junctional kyphosis at the upper end of fusion T12- L1. This can be associated with recollapse of the height of the right intrapedicular distance (stenosis) and right neuroforamen :1 and L2.  Plan: Avoid bending, stooping and avoid lifting weights greater than 10 lbs. Avoid prolong standing and walking. Avoid frequent bending and stooping  No lifting greater than 10 lbs. May use ice or moist heat for pain. Weight loss is of benefit. Handicap license is approved. Go back to wearing brace that was used post operatively 1/2 days. CT of the thoracolumbar spine ordered to assess for increasing compression deformity L1 and loosening of hardware and to evaluate the opening of the right L1 and L2 neuroforamen.    Follow-Up Instructions: Return in about 3 weeks (around 08/19/2016).   Orders:  Orders Placed This Encounter  Procedures  . XR Lumbar Spine 2-3 Views   No orders of the defined types were placed in this encounter.     Procedures: No procedures performed   Clinical Data: No additional findings.   Subjective: Chief Complaint  Patient presents with  . Lower Back - Follow-up    [name] [name] is here 8 months post op for Right L2-3 facetectomy, posterolateral fusion L2-3 with pedicle screws, rods, local bone graft,  Vivigen cancellous chips.  She states that she is just in as much pain as she was before the surgery.  She states the pain radiates from her back down to her right leg to the knee and into the right heel. Pain in the right   Radiates from the right groin into the right anterior thigh down to the right knee. Bowel or bladder function with constipation, taking miralax one packet daily. No numbness, but experiencing pain in the right heel cord.  Pain in the right thigh is worse with standing and walking. Pain in the back improves with sitting. Seeing Dr. Hardin Negus, taking oxycodone 5 mg every 6 hours for pain.    Review of Systems  Constitutional: Negative.   HENT: Negative.   Eyes: Negative.   Respiratory: Negative.   Cardiovascular: Negative.   Gastrointestinal: Negative.   Endocrine: Negative.   Genitourinary: Negative.   Musculoskeletal: Negative.   Skin: Negative.   Allergic/Immunologic: Negative.   Neurological: Negative.   Hematological: Negative.   Psychiatric/Behavioral: Negative.      Objective: Vital Signs: BP (!) 144/86 (BP Location: Left Arm, Patient Position: Sitting)   Pulse 63   Ht 5\' 5"  (1.651 m)   Wt 138 lb (62.6 kg)   BMI 22.96 kg/m   Physical Exam  Constitutional: She is oriented to person, place, and time. She appears well-developed and well-nourished.  HENT:  Head: Normocephalic and atraumatic.  Eyes: EOM are normal.  Pupils are equal, round, and reactive to light.  Neck: Normal range of motion. Neck supple.  Pulmonary/Chest: Effort normal and breath sounds normal.  Abdominal: Soft. Bowel sounds are normal.  Neurological: She is alert and oriented to person, place, and time.  Skin: Skin is warm and dry.  Psychiatric: She has a normal mood and affect. Her behavior is normal. Judgment and thought content normal.    Back Exam   Tenderness  The patient is experiencing tenderness in the lumbar.  Range of Motion  Extension: normal  Flexion: abnormal    Lateral Bend Right: abnormal  Lateral Bend Left: abnormal  Rotation Right: abnormal  Rotation Left: abnormal   Muscle Strength  Right Quadriceps:  5/5  Left Quadriceps:  5/5  Right Hamstrings:  5/5  Left Hamstrings:  5/5   Tests  Straight leg raise right: negative Straight leg raise left: negative  Reflexes  Babinski's sign: normal   Other  Toe Walk: abnormal Heel Walk: abnormal Sensation: normal Gait: normal  Erythema: no back redness Scars: absent      Specialty Comments:  No specialty comments available.  Imaging: Xr Lumbar Spine 2-3 Views  Result Date: 07/29/2016 AP and lateral flexion and extension radiographs of the lumbar spine show loosening of hardware screws at the L1 level, lucency noted about the screws within the pedicles at L1. increase in kyphosis measured from the superior endplate of T73 to the inferior endplate of L1 from 18 degrees at the time of surgery 11/2015 to todays radiograph 30 degrees.      PMFS History: Patient Active Problem List   Diagnosis Date Noted  . Herniated lumbar intervertebral disc 06/25/2012    Priority: High    Class: Acute  . Anxiety state 06/08/2016  . History of lumbar spinal fusion 04/13/2016  . Spondylolisthesis, lumbar region 04/13/2016  . Leukocytosis 03/28/2016  . Ileus ([place])   . Spinal stenosis, lumbar region, with neurogenic claudication 05/29/2015  . DDD (degenerative disc disease), lumbar 04/21/2015  . Encounter for Medicare annual wellness exam 02/27/2015  . Mixed hyperlipidemia 11/24/2014  . GERD  11/24/2014  . Depression, major, in partial remission ([place]) 09/26/2014  . Chronic pain syndrome 09/26/2014  . Coronary artery disease due to lipid rich plaque   . Osteoporosis 02/10/2014  . Prediabetes 08/14/2013  . Vitamin D deficiency 08/14/2013  . Medication management 08/14/2013  . Constipation, chronic 04/03/2013  . MGUS (monoclonal gammopathy of unknown significance) 03/05/2013  . Vertebral  compression fracture ([place]) 06/23/2012  . ESOPHAGEAL STRICTURE 08/28/2008  . Asthma 07/29/2008  . Incarcerated recurrent paraesophageal hiatal hernia s/p lap redo repair [id number] 01/31/2008  . Essential hypertension 07/25/2007   Past Medical History:  Diagnosis Date  . Anxiety    takes Xanax daily as needed  . Asthma    Albuterol daily as needed  . CAD (coronary artery disease)    LHC 2003 with 40% pLAD, 30% mLAD, 100% D1 (moderate vessel), 100%  D2 (small vessel), 95% mid small OM2.  Pt had PTCA to D1;  D2 and OM2 small vessels and tx medically;  Last Myoview (2012): anterior infarct seen with scar, no ischemia. EF normal. Pt managed medically;  Lexiscan Myoview (12/14):  Low risk; ant scar with very mild peri-infarct ischemia; EF 55% with ant AK   . Chronic back pain    HNP, DDD, spinal stenosis, vertebral compression fractures.   . Chronic nausea    takes Zofran daily as needed.  normal gastric emptying study 03/2013  .  Constipation    felt to be functional. takes Senokot and Miralax daily.  treated with Linzess in past.   . DIVERTICULOSIS, COLON 08/28/2008   Qualifier: Diagnosis of  By: [name], [name], [name]    . Elevated LFTs 2015   probably med induced DILI, from Augmentin vs Pravastatin  . GERD (gastroesophageal reflux disease)    hx of esophageal stricture   . Hiatal hernia    Paraesophageal hernias. s/p fundoplications in 5885 and 04/2013  . History of bronchitis several of yrs ago  . History of colon polyps 03/2009, 03/2011   adenomatous colon polyps, no high grade dysplasia.   [name] History of vertigo    no meds  . Hyperlipidemia    takes Pravastatin daily  . Hypertension    takes Amlodipine,Metoprolol,and Losartan daily  . Nausea 03/2016  . Osteoporosis   . PONV (postoperative nausea and vomiting)    yrs ago  . Slow urinary stream    takes Rapaflo daily  . TIA (transient ischemic attack) 1995   no residual effects noted    Family History  Problem Relation Age of  Onset  . Colon cancer Mother 74  . Heart disease Father 63    heart attack    Past Surgical History:  Procedure Laterality Date  . APPENDECTOMY     patient unsure of date  . BACK SURGERY    . BLADDER REPAIR    . CHOLECYSTECTOMY     Patient unsure of date.  . CORONARY ANGIOPLASTY  11/16/2001   1 stent  . ESOPHAGEAL MANOMETRY N/A 03/25/2013   Procedure: ESOPHAGEAL MANOMETRY (EM);  Surgeon: Inda Castle, MD;  Location: [facility];  Service: Endoscopy;  Laterality: N/A;  . ESOPHAGOGASTRODUODENOSCOPY N/A 03/15/2013   Procedure: ESOPHAGOGASTRODUODENOSCOPY (EGD);  Surgeon: Jerene Bears, MD;  Location: [facility] ENDOSCOPY;  Service: Endoscopy;  Laterality: N/A;  . ESOPHAGOGASTRODUODENOSCOPY N/A 12/25/2015   Procedure: ESOPHAGOGASTRODUODENOSCOPY (EGD);  Surgeon: Doran Stabler, MD;  [facility];  Service: Endoscopy;  Laterality: N/A;  . EYE SURGERY  11/2000   bilateral cataracts with lens implant  . FIXATION KYPHOPLASTY LUMBAR SPINE  X 2   "L3-4"  . HEMORRHOID SURGERY    . HIATAL HERNIA REPAIR  06/03/2011   Procedure: LAPAROSCOPIC REPAIR OF HIATAL HERNIA;  Surgeon: Adin Hector, MD;  Location: [facility];  Service: General;  Laterality: N/A;  . HIATAL HERNIA REPAIR N/A 04/24/2013   Procedure: LAPAROSCOPIC  REPAIR RECURRENT PARASOPHAGEAL HIATAL HERNIA WITH FUNDOPLICATION;  Surgeon: Adin Hector, MD;  Location: [facility];  Service: General;  Laterality: N/A;  . INSERTION OF MESH N/A 04/24/2013   Procedure: INSERTION OF MESH;  Surgeon: Adin Hector, MD;  Location: [facility];  Service: General;  Laterality: N/A;  . LAPAROSCOPIC LYSIS OF ADHESIONS N/A 04/24/2013   Procedure: LAPAROSCOPIC LYSIS OF ADHESIONS;  Surgeon: Adin Hector, MD;  Location: [facility];  Service: General;  Laterality: N/A;  . LAPAROSCOPIC NISSEN FUNDOPLICATION  0/27/7412   Procedure: LAPAROSCOPIC NISSEN FUNDOPLICATION;  Surgeon: Adin Hector, MD;  Location: [facility];  Service: General;  Laterality: N/A;  . LUMBAR  FUSION  12/07/2015   Right L2-3 facetectomy, posterolateral fusion L2-3 with pedicle screws, rods, local bone graft, Vivigen cancellous chips. Fusion extended to the L1 level with pedicle screws and rods  . LUMBAR LAMINECTOMY/DECOMPRESSION MICRODISCECTOMY Right 06/26/2012   Procedure: LUMBAR LAMINECTOMY/DECOMPRESSION MICRODISCECTOMY;  Surgeon: Jessy Oto, MD;  Location: [place] [facility];  Service: Orthopedics;  Laterality: Right;  RIGHT L4-5 MICRODISCECTOMY  .  LUMBAR LAMINECTOMY/DECOMPRESSION MICRODISCECTOMY N/A 05/29/2015   Procedure: RIGHT L2-3 MICRODISCECTOMY;  Surgeon: Jessy Oto, MD;  Location: [facility];  Service: Orthopedics;  Laterality: N/A;  . TOTAL ABDOMINAL HYSTERECTOMY  1975   partial   Social History   Occupational History  . Retired Retired    worked at [employer]  . Smoking status: Never Smoker  . Smokeless tobacco: Never Used  . Alcohol use No  . Drug use: No  . Sexual activity: No

## 2016-07-29 NOTE — Patient Instructions (Addendum)
Avoid bending, stooping and avoid lifting weights greater than 10 lbs. Avoid prolong standing and walking. Avoid frequent bending and stooping  No lifting greater than 10 lbs. May use ice or moist heat for pain. Weight loss is of benefit. Handicap license is approved. Go back to wearing brace that was used post operatively 1/2 days. CT of the thoracolumbar spine ordered to assess for increasing compression deformity L1 and loosening of hardware and to evaluate the opening of the right L1 and L2 neuroforamen.

## 2016-08-04 ENCOUNTER — Other Ambulatory Visit: Payer: Self-pay | Admitting: Physician Assistant

## 2016-08-04 NOTE — Telephone Encounter (Signed)
[facility] WAS CALLED INTO PHARMACY ON [facility] MARCH 2018 @ 2:53 PM BY DD

## 2016-08-06 ENCOUNTER — Other Ambulatory Visit: Payer: Self-pay | Admitting: Internal Medicine

## 2016-08-10 DIAGNOSIS — G894 Chronic pain syndrome: Secondary | ICD-10-CM | POA: Diagnosis not present

## 2016-08-10 DIAGNOSIS — Z79891 Long term (current) use of opiate analgesic: Secondary | ICD-10-CM | POA: Diagnosis not present

## 2016-08-10 DIAGNOSIS — M961 Postlaminectomy syndrome, not elsewhere classified: Secondary | ICD-10-CM | POA: Diagnosis not present

## 2016-08-10 DIAGNOSIS — M4726 Other spondylosis with radiculopathy, lumbar region: Secondary | ICD-10-CM | POA: Diagnosis not present

## 2016-08-11 ENCOUNTER — Ambulatory Visit
Admission: RE | Admit: 2016-08-11 | Discharge: 2016-08-11 | Disposition: A | Discharge status: Home / Self Care | Payer: PPO | Source: Ambulatory Visit | Attending: Specialist | Admitting: Specialist

## 2016-08-11 DIAGNOSIS — M4015 Other secondary kyphosis, thoracolumbar region: Secondary | ICD-10-CM

## 2016-08-11 DIAGNOSIS — M545 Low back pain: Secondary | ICD-10-CM | POA: Diagnosis not present

## 2016-08-11 DIAGNOSIS — T84498A Other mechanical complication of other internal orthopedic devices, implants and grafts, initial encounter: Secondary | ICD-10-CM

## 2016-08-11 DIAGNOSIS — M48062 Spinal stenosis, lumbar region with neurogenic claudication: Secondary | ICD-10-CM

## 2016-09-08 ENCOUNTER — Ambulatory Visit (INDEPENDENT_AMBULATORY_CARE_PROVIDER_SITE_OTHER): Payer: PPO | Admitting: Specialist

## 2016-09-08 ENCOUNTER — Encounter (INDEPENDENT_AMBULATORY_CARE_PROVIDER_SITE_OTHER): Payer: Self-pay | Admitting: Specialist

## 2016-09-08 VITALS — BP 140/80 | HR 65 | Ht 65.0 in | Wt 138.0 lb

## 2016-09-08 DIAGNOSIS — M810 Age-related osteoporosis without current pathological fracture: Secondary | ICD-10-CM | POA: Diagnosis not present

## 2016-09-08 DIAGNOSIS — Z981 Arthrodesis status: Secondary | ICD-10-CM | POA: Diagnosis not present

## 2016-09-08 DIAGNOSIS — T84498A Other mechanical complication of other internal orthopedic devices, implants and grafts, initial encounter: Secondary | ICD-10-CM | POA: Diagnosis not present

## 2016-09-08 NOTE — Patient Instructions (Signed)
Avoid frequent bending and stooping  No lifting greater than 10 lbs. May use ice or moist heat for pain. Weight loss is of benefit. Handicap license is approved.  Go to wearing brace half days. Wait 3 months and if pain is persisting then we will repeat MRI and consider removing hardware exploring the fusion and reinstrumenting with augmentation of the fusion if necessary.

## 2016-09-08 NOTE — Progress Notes (Signed)
Office Visit Note   Patient: [name] [name]           Date of Birth: Aug 04, 1935           MRN: [id number] Visit Date: 09/08/2016              Requested by: [name], MD [address] [place], [place] [address] PCP: Alesia Richards, MD   Assessment & Plan: Visit Diagnoses:  1. Loosening of hardware in spine ([facility])   2. Age-related osteoporosis without current pathological fracture   3. History of lumbar spinal fusion     Plan: Avoid frequent bending and stooping  No lifting greater than 10 lbs. May use ice or moist heat for pain. Weight loss is of benefit. Handicap license is approved.  Go to wearing brace half days. Wait 3 months and if pain is persisting then we will repeat MRI and consider removing hardware exploring the fusion and reinstrumenting with augmentation of the fusion if necessary. Try calcium citrate as opposed to calcium carbonate for calcium supplements. 200mg  twice a day. Vitamin D 1000 Units per day.  Follow-Up Instructions: Return in about 3 months (around 12/09/2016).   Orders:  No orders of the defined types were placed in this encounter.  No orders of the defined types were placed in this encounter.     Procedures: No procedures performed   Clinical Data: Findings:  CT scan shows loosing of the left L1 pedicle screw greater than the right, chronic compression fracture L1 with autofusion between L1 And T12. The right L2 pedicle screw is extra pedicular and there is some cement about the screw but overall position is medial to the normal course of the L1 and L2 nerve roots, either case it may need to be removed eventually. The remaining left L2 pedicle screw and bilateral L3 pedicle screws are in good postion. The screws at L1 have no cement but there are lucencies left greater than right. The posterior fusion is maturing but is not completely healed.      Subjective: Chief Complaint  Patient presents with  . Lower  Back - Follow-up    Post CT Scan Lumbar Spine    81 year old female with  L1-2 and L2-3 decompression right side with fusion and instrumentation with cement, pedicle screws and rods. Has been experiencing recurring pain in the Back and right groin but also pain in the left upper lumbar spine. She has severe osteoporosis with pathologic fractures at L1, L3 and L4. Surgery done due to intracable pain and right L1 and L2 neuroforamenal narrowing. Her pain improved and more recently has been recurring. Dr. Hardin Negus has increased her pain relieving medications and the results of her CT scan have returned.    Review of Systems  Constitutional: Negative.   HENT: Negative.   Eyes: Negative.   Respiratory: Negative.   Cardiovascular: Negative.   Gastrointestinal: Negative.   Endocrine: Negative.   Genitourinary: Negative.   Musculoskeletal: Negative.   Skin: Negative.   Allergic/Immunologic: Negative.   Neurological: Negative.   Hematological: Negative.   Psychiatric/Behavioral: Negative.      Objective: Vital Signs: BP 140/80 (BP Location: Left Arm, Patient Position: Sitting)   Pulse 65   Ht 5\' 5"  (1.651 m)   Wt 138 lb (62.6 kg)   BMI 22.96 kg/m   Physical Exam  Constitutional: She is oriented to person, place, and time. She appears well-developed and well-nourished.  HENT:  Head: Normocephalic and atraumatic.  Eyes:  EOM are normal. Pupils are equal, round, and reactive to light.  Neck: Normal range of motion. Neck supple.  Pulmonary/Chest: Effort normal and breath sounds normal.  Abdominal: Soft. Bowel sounds are normal.  Neurological: She is alert and oriented to person, place, and time.  Skin: Skin is warm and dry.  Psychiatric: She has a normal mood and affect. Her behavior is normal. Judgment and thought content normal.    Back Exam   Tenderness  The patient is experiencing tenderness in the lumbar.  Range of Motion  Extension: abnormal  Flexion: abnormal  Lateral  Bend Right: abnormal  Lateral Bend Left: abnormal  Rotation Right: abnormal  Rotation Left: abnormal   Muscle Strength  Right Quadriceps:  5/5  Left Quadriceps:  5/5  Right Hamstrings:  5/5  Left Hamstrings:  5/5   Tests  Straight leg raise right: negative Straight leg raise left: negative  Reflexes  Patellar: 1/4 Achilles: 1/4 Babinski's sign: normal   Other  Toe Walk: normal Heel Walk: normal Sensation: normal Gait: normal  Erythema: no back redness Scars: present      Specialty Comments:  No specialty comments available.  Imaging: No results found.   PMFS History: Patient Active Problem List   Diagnosis Date Noted  . Herniated lumbar intervertebral disc 06/25/2012    Priority: High    Class: Acute  . Anxiety state 06/08/2016  . History of lumbar spinal fusion 04/13/2016  . Spondylolisthesis, lumbar region 04/13/2016  . Leukocytosis 03/28/2016  . Ileus ([place])   . Spinal stenosis, lumbar region, with neurogenic claudication 05/29/2015  . DDD (degenerative disc disease), lumbar 04/21/2015  . Encounter for Medicare annual wellness exam 02/27/2015  . Mixed hyperlipidemia 11/24/2014  . GERD  11/24/2014  . Depression, major, in partial remission ([place]) 09/26/2014  . Chronic pain syndrome 09/26/2014  . Coronary artery disease due to lipid rich plaque   . Osteoporosis 02/10/2014  . Prediabetes 08/14/2013  . Vitamin D deficiency 08/14/2013  . Medication management 08/14/2013  . Constipation, chronic 04/03/2013  . MGUS (monoclonal gammopathy of unknown significance) 03/05/2013  . Vertebral compression fracture ([place]) 06/23/2012  . ESOPHAGEAL STRICTURE 08/28/2008  . Asthma 07/29/2008  . Incarcerated recurrent paraesophageal hiatal hernia s/p lap redo repair [id number] 01/31/2008  . Essential hypertension 07/25/2007   Past Medical History:  Diagnosis Date  . Anxiety    takes Xanax daily as needed  . Asthma    Albuterol daily as needed  . CAD (coronary  artery disease)    LHC 2003 with 40% pLAD, 30% mLAD, 100% D1 (moderate vessel), 100%  D2 (small vessel), 95% mid small OM2.  Pt had PTCA to D1;  D2 and OM2 small vessels and tx medically;  Last Myoview (2012): anterior infarct seen with scar, no ischemia. EF normal. Pt managed medically;  Lexiscan Myoview (12/14):  Low risk; ant scar with very mild peri-infarct ischemia; EF 55% with ant AK   . Chronic back pain    HNP, DDD, spinal stenosis, vertebral compression fractures.   . Chronic nausea    takes Zofran daily as needed.  normal gastric emptying study 03/2013  . Constipation    felt to be functional. takes Senokot and Miralax daily.  treated with Linzess in past.   . DIVERTICULOSIS, COLON 08/28/2008   Qualifier: Diagnosis of  By: [name], [name], [name]    . Elevated LFTs 2015   probably med induced DILI, from Augmentin vs Pravastatin  . GERD (gastroesophageal reflux disease)    hx of  esophageal stricture   . Hiatal hernia    Paraesophageal hernias. s/p fundoplications in 8341 and 04/2013  . History of bronchitis several of yrs ago  . History of colon polyps 03/2009, 03/2011   adenomatous colon polyps, no high grade dysplasia.   [name] History of vertigo    no meds  . Hyperlipidemia    takes Pravastatin daily  . Hypertension    takes Amlodipine,Metoprolol,and Losartan daily  . Nausea 03/2016  . Osteoporosis   . PONV (postoperative nausea and vomiting)    yrs ago  . Slow urinary stream    takes Rapaflo daily  . TIA (transient ischemic attack) 1995   no residual effects noted    Family History  Problem Relation Age of Onset  . Colon cancer Mother 76  . Heart disease Father 67    heart attack    Past Surgical History:  Procedure Laterality Date  . APPENDECTOMY     patient unsure of date  . BACK SURGERY    . BLADDER REPAIR    . CHOLECYSTECTOMY     Patient unsure of date.  . CORONARY ANGIOPLASTY  11/16/2001   1 stent  . ESOPHAGEAL MANOMETRY N/A 03/25/2013   Procedure:  ESOPHAGEAL MANOMETRY (EM);  Surgeon: Inda Castle, MD;  [facility];  Service: Endoscopy;  Laterality: N/A;  . ESOPHAGOGASTRODUODENOSCOPY N/A 03/15/2013   Procedure: ESOPHAGOGASTRODUODENOSCOPY (EGD);  Surgeon: Jerene Bears, MD;  Location[facility] ENDOSCOPY;  Service: Endoscopy;  Laterality: N/A;  . ESOPHAGOGASTRODUODENOSCOPY N/A 12/25/2015   Procedure: ESOPHAGOGASTRODUODENOSCOPY (EGD);  Surgeon: Doran Stabler, MD;  Location: [facility] ENDOSCOPY;  Service: Endoscopy;  Laterality: N/A;  . EYE SURGERY  11/2000   bilateral cataracts with lens implant  . FIXATION KYPHOPLASTY LUMBAR SPINE  X 2   "L3-4"  . HEMORRHOID SURGERY    . HIATAL HERNIA REPAIR  06/03/2011   Procedure: LAPAROSCOPIC REPAIR OF HIATAL HERNIA;  Surgeon: Adin Hector, MD;  [facility]: [facility];  Service: General;  Laterality: N/A;  . HIATAL HERNIA REPAIR N/A 04/24/2013   Procedure: LAPAROSCOPIC  REPAIR RECURRENT PARASOPHAGEAL HIATAL HERNIA WITH FUNDOPLICATION;  Surgeon: Adin Hector, MD;  Location: [facility];  Service: General;  Laterality: N/A;  . INSERTION OF MESH N/A 04/24/2013   Procedure: INSERTION OF MESH;  Surgeon: Adin Hector, MD;  [facility]: [facility];  Service: General;  Laterality: N/A;  . LAPAROSCOPIC LYSIS OF ADHESIONS N/A 04/24/2013   Procedure: LAPAROSCOPIC LYSIS OF ADHESIONS;  Surgeon: Adin Hector, MD;  [facility]: [facility];  Service: General;  Laterality: N/A;  . LAPAROSCOPIC NISSEN FUNDOPLICATION  9/62/2297   Procedure: LAPAROSCOPIC NISSEN FUNDOPLICATION;  Surgeon: Adin Hector, MD;  Location: [facility];  Service: General;  Laterality: N/A;  . LUMBAR FUSION  12/07/2015   Right L2-3 facetectomy, posterolateral fusion L2-3 with pedicle screws, rods, local bone graft, Vivigen cancellous chips. Fusion extended to the L1 level with pedicle screws and rods  . LUMBAR LAMINECTOMY/DECOMPRESSION MICRODISCECTOMY Right 06/26/2012   Procedure: LUMBAR LAMINECTOMY/DECOMPRESSION MICRODISCECTOMY;  Surgeon: Jessy Oto, MD;   Location: [facility];  Service: Orthopedics;  Laterality: Right;  RIGHT L4-5 MICRODISCECTOMY  . LUMBAR LAMINECTOMY/DECOMPRESSION MICRODISCECTOMY N/A 05/29/2015   Procedure: RIGHT L2-3 MICRODISCECTOMY;  Surgeon: Jessy Oto, MD;  Location: [facility];  Service: Orthopedics;  Laterality: N/A;  . TOTAL ABDOMINAL HYSTERECTOMY  1975   partial   Social History   Occupational History  . Retired Retired    worked at [employer]  Topics  . Smoking status: Never Smoker  . Smokeless tobacco: Never Used  . Alcohol use No  . Drug use: No  . Sexual activity: No

## 2016-09-12 DIAGNOSIS — Z79891 Long term (current) use of opiate analgesic: Secondary | ICD-10-CM | POA: Diagnosis not present

## 2016-09-12 DIAGNOSIS — G894 Chronic pain syndrome: Secondary | ICD-10-CM | POA: Diagnosis not present

## 2016-09-12 DIAGNOSIS — M961 Postlaminectomy syndrome, not elsewhere classified: Secondary | ICD-10-CM | POA: Diagnosis not present

## 2016-09-12 DIAGNOSIS — M4726 Other spondylosis with radiculopathy, lumbar region: Secondary | ICD-10-CM | POA: Diagnosis not present

## 2016-09-22 ENCOUNTER — Other Ambulatory Visit: Payer: Self-pay | Admitting: Physician Assistant

## 2016-09-22 ENCOUNTER — Ambulatory Visit (INDEPENDENT_AMBULATORY_CARE_PROVIDER_SITE_OTHER): Payer: PPO | Admitting: Physician Assistant

## 2016-09-22 ENCOUNTER — Encounter: Payer: Self-pay | Admitting: Physician Assistant

## 2016-09-22 VITALS — BP 116/62 | HR 77 | Temp 98.6°F | Resp 16 | Ht 65.0 in | Wt 145.0 lb

## 2016-09-22 DIAGNOSIS — J40 Bronchitis, not specified as acute or chronic: Secondary | ICD-10-CM

## 2016-09-22 MED ORDER — PREDNISONE 20 MG PO TABS: ORAL_TABLET | ORAL | 0 refills | Status: DC

## 2016-09-22 MED ORDER — ALBUTEROL SULFATE HFA 108 (90 BASE) MCG/ACT IN AERS: 2.0000 | INHALATION_SPRAY | Freq: Four times a day (QID) | RESPIRATORY_TRACT | 2 refills | Status: DC | PRN

## 2016-09-22 MED ORDER — ALBUTEROL SULFATE HFA 108 (90 BASE) MCG/ACT IN AERS: 1.0000 | INHALATION_SPRAY | Freq: Four times a day (QID) | RESPIRATORY_TRACT | 2 refills | Status: DC | PRN

## 2016-09-22 MED ORDER — AZITHROMYCIN 250 MG PO TABS: ORAL_TABLET | ORAL | 1 refills | Status: AC

## 2016-09-22 NOTE — Addendum Note (Signed)
Addended by: [name] on: 09/22/2016 04:21 PM   Modules accepted: Level of Service

## 2016-09-22 NOTE — Progress Notes (Signed)
Subjective:    Patient ID: [name] [name], female    DOB: [birth year], 81 y.o.   MRN: [id number]  HPI 81 y.o. WF history of asthma presents with cough x 6 days, getting worse. No fever, + chills, hoarseness, cough with green mucus, some wheezing, no SOB, CP.   Blood pressure 116/62, pulse 77, temperature 98.6 F (37 C), resp. rate 16, height 5\' 5"  (1.651 m), weight 145 lb (65.8 kg), SpO2 98 %.  Medications Current Outpatient Prescriptions on File Prior to Visit  Medication Sig  . acetaminophen (TYLENOL) 325 MG tablet Take 2 tablets (650 mg total) by mouth every 6 (six) hours as needed for mild pain (or Fever >/= 101).  [name] albuterol (PROVENTIL HFA;VENTOLIN HFA) 108 (90 BASE) MCG/ACT inhaler Inhale 1-2 puffs into the lungs every 6 (six) hours as needed for wheezing or shortness of breath.  . ALPRAZolam (XANAX) 0.5 MG tablet TAKE ONE-HALF TO ONE TABLET BY MOUTH TWICE DAILY TO THREE TIMES DAILY FOR  NEVERS  . amLODipine (NORVASC) 10 MG tablet Take 1 tablet (10 mg total) by mouth at bedtime.  . Cholecalciferol (VITAMIN D3) 2000 UNITS TABS Take 6,000 Units by mouth daily.   . fluticasone (FLONASE) 50 MCG/ACT nasal spray Place 2 sprays into both nostrils at bedtime.  [name] ipratropium-albuterol (DUONEB) 0.5-2.5 (3) MG/3ML SOLN USE ONE VIAL IN NEBULIZER 4 TIMES DAILY (Patient taking differently: Inhale 3 mLs into the lungs every 6 (six) hours as needed. For shortness of breath)  . losartan (COZAAR) 100 MG tablet TAKE ONE TABLET BY MOUTH ONCE DAILY ( DISCONTINUE BENICAR)  . magnesium oxide (MAG-OX) 400 MG tablet Take 400 mg by mouth daily.  . meclizine (ANTIVERT) 25 MG tablet 1/2-1 pill up to 3 times daily for motion sickness/dizziness  . metoprolol (LOPRESSOR) 50 MG tablet TAKE ONE TABLET BY MOUTH TWICE DAILY FOR BLOOD PRESSURE  . mometasone-formoterol (DULERA) 200-5 MCG/ACT AERO Inhale 2 puffs into the lungs 2 (two) times daily. (Patient taking differently: Inhale 2 puffs into the lungs 2 (two) times  daily as needed for wheezing. )  . neomycin-polymyxin b-dexamethasone (MAXITROL) 3.5-10000-0.1 OINT Apply to stye 4 x / day  . nitroGLYCERIN (NITROSTAT) 0.4 MG SL tablet Place 1 tablet (0.4 mg total) under the tongue every 5 (five) minutes as needed for chest pain.  [name] ondansetron (ZOFRAN-ODT) 8 MG disintegrating tablet DISSOLVE ONE TABLET IN MOUTH EVERY 8 HOURS AS NEEDED FOR NAUSEA AND VOMITING  . orphenadrine (NORFLEX) 100 MG tablet Take 1 tablet (100 mg total) by mouth 2 (two) times daily as needed for muscle spasms.  [name] OVER THE COUNTER MEDICATION Take 1,000 mg by mouth 2 (two) times daily. [place] Calcium 1000mg   . oxyCODONE (OXY IR/ROXICODONE) 5 MG immediate release tablet Take 1 tablet (5 mg total) by mouth every 4 (four) hours as needed for severe pain or breakthrough pain. (Patient taking differently: Take 5 mg by mouth every 6 (six) hours as needed for severe pain or breakthrough pain. )  . pantoprazole (PROTONIX) 40 MG tablet Take 1 tablet (40 mg total) by mouth daily.  . polyethylene glycol (MIRALAX / GLYCOLAX) packet Take 17 g by mouth 2 (two) times daily.  . potassium chloride SA (K-DUR,KLOR-CON) 20 MEQ tablet Take 1 tablet (20 mEq total) by mouth 2 (two) times daily.  . pravastatin (PRAVACHOL) 40 MG tablet TAKE ONE TABLET BY MOUTH ONCE DAILY  . promethazine (PHENERGAN) 25 MG tablet Take 1 tablet (25 mg total) by mouth every 6 (six) hours as  needed for nausea or vomiting.  . senna-docusate (SENOKOT-S) 8.6-50 MG tablet Take 2 tablets by mouth 2 (two) times daily.  . silodosin (RAPAFLO) 8 MG CAPS capsule Take 8 mg by mouth every other day.   . sucralfate (CARAFATE) 1 g tablet TAKE ONE TABLET BY MOUTH 4 TIMES DAILY BEFORE MEALS AND AT BEDTIME FOR HEARTBURN   Current Facility-Administered Medications on File Prior to Visit  Medication  . promethazine (PHENERGAN) injection 25 mg    Problem list She has Essential hypertension; Asthma; ESOPHAGEAL STRICTURE; Incarcerated recurrent  paraesophageal hiatal hernia s/p lap redo repair [id number]; Vertebral compression fracture ([place]); Herniated lumbar intervertebral disc; MGUS (monoclonal gammopathy of unknown significance); Constipation, chronic; Prediabetes; Vitamin D deficiency; Medication management; Osteoporosis; Coronary artery disease due to lipid rich plaque; Depression, major, in partial remission ([place]); Chronic pain syndrome; Mixed hyperlipidemia; GERD ; Encounter for Medicare annual wellness exam; DDD (degenerative disc disease), lumbar; Spinal stenosis, lumbar region, with neurogenic claudication; Ileus ([place]); Leukocytosis; History of lumbar spinal fusion; Spondylolisthesis, lumbar region; and Anxiety state on her problem list.   Review of Systems  Constitutional: Negative for chills and diaphoresis.  HENT: Positive for postnasal drip, sinus pressure and sneezing. Negative for ear pain and sore throat.   Respiratory: Positive for cough and wheezing. Negative for chest tightness and shortness of breath.   Cardiovascular: Negative.   Gastrointestinal: Negative.   Genitourinary: Negative.   Musculoskeletal: Negative for neck pain.  Neurological: Positive for headaches.       Objective:   Physical Exam  Constitutional: She is oriented to person, place, and time. She appears well-developed and well-nourished.  HENT:  Head: Normocephalic and atraumatic.  Right Ear: External ear normal.  Left Ear: External ear normal.  Nose: Nose normal.  Mouth/Throat: Oropharynx is clear and moist.  Eyes: Conjunctivae are normal. Pupils are equal, round, and reactive to light.  Neck: Normal range of motion. Neck supple.  Cardiovascular: Normal rate and regular rhythm.   Pulmonary/Chest: Effort normal. No respiratory distress. She has wheezes (with some coarse breath sounds LLL). She has no rales. She exhibits no tenderness.  Abdominal: Soft. Bowel sounds are normal.  Lymphadenopathy:    She has no cervical adenopathy.  Neurological:  She is alert and oriented to person, place, and time.  Skin: Skin is warm and dry.      Assessment & Plan:  1. Bronchitis Will hold the zpak and take if she is not getting better, increase fluids, rest, cont allergy pill - predniSONE (DELTASONE) 20 MG tablet; 2 tablets daily for 3 days, 1 tablet daily for 4 days.  Dispense: 10 tablet; Refill: 0 - azithromycin (ZITHROMAX) 250 MG tablet; Take 2 tablets (500 mg) on  Day 1,  followed by 1 tablet (250 mg) once daily on Days 2 through 5.  Dispense: 6 each; Refill: 1 - albuterol (PROVENTIL HFA;VENTOLIN HFA) 108 (90 Base) MCG/ACT inhaler; Inhale 1-2 puffs into the lungs every 6 (six) hours as needed for wheezing or shortness of breath.  Dispense: 18 g; Refill: 2 Dulera  The patient was advised to call immediately if she has any concerning symptoms in the interval. The patient voices understanding of current treatment options and is in agreement with the current care plan.The patient knows to call the clinic with any problems, questions or concerns or go to the ER if any further progression of symptoms.

## 2016-09-22 NOTE — Patient Instructions (Signed)
Make sure you are on an allergy pill, see below for more details. Please take the prednisone as directed below, this is NOT an antibiotic so you do NOT have to finish it. You can take it for a few days and stop it if you are doing better.   Please take the prednisone to help decrease inflammation and therefore decrease symptoms. Take it it with food to avoid GI upset. It can cause increased energy but on the other hand it can make it hard to sleep at night so please take it AT [place], it takes 8-12 hours to start working so it will NOT affect your sleeping if you take it at night with your food!!  If you are diabetic it will increase your sugars so decrease carbs and monitor your sugars closely.     HOW TO TREAT VIRAL COUGH AND COLD SYMPTOMS:  -Symptoms usually last at least 1 week with the worst symptoms being around day 4.  - colds usually start with a sore throat and end with a cough, and the cough can take 2 weeks to get better.  -No antibiotics are needed for colds, flu, sore throats, cough, bronchitis UNLESS symptoms are longer than 7 days OR if you are getting better then get drastically worse.  -There are a lot of combination medications (Dayquil, Nyquil, Vicks 44, tyelnol cold and sinus, ETC). Please look at the ingredients on the back so that you are treating the correct symptoms and not doubling up on medications/ingredients.    Medicines you can use  Nasal congestion  - pseudoephedrine (Sudafed)- behind the counter, do not use if you have high blood pressure, medicine that have -D in them.  - phenylephrine (Sudafed PE) -Dextormethorphan + chlorpheniramine (Coridcidin HBP)- okay if you have high blood pressure -Oxymetazoline (Afrin) nasal spray- LIMIT to 3 days -Saline nasal spray -Neti pot (used distilled or bottled water)  Ear pain/congestion  -pseudoephedrine (sudafed) - Nasonex/flonase nasal spray  Fever  -Acetaminophen (Tyelnol) -Ibuprofen (Advil, motrin,  aleve)  Sore Throat  -Acetaminophen (Tyelnol) -Ibuprofen (Advil, motrin, aleve) -Drink a lot of water -Gargle with salt water - Rest your voice (don't talk) -Throat sprays -Cough drops  Body Aches  -Acetaminophen (Tyelnol) -Ibuprofen (Advil, motrin, aleve)  Headache  -Acetaminophen (Tyelnol) -Ibuprofen (Advil, motrin, aleve) - Exedrin, Exedrin Migraine  Allergy symptoms (cough, sneeze, runny nose, itchy eyes) -Claritin or loratadine cheapest but likely the weakest  -Zyrtec or certizine at night because it can make you sleepy -The strongest is allegra or fexafinadine  Cheapest at [employer], [employer], [employer]  Cough  -Dextromethorphan (Delsym)- medicine that has DM in it -Guafenesin (Mucinex/Robitussin) - cough drops - drink lots of water  Chest Congestion  -Guafenesin (Mucinex/Robitussin)  Red Itchy Eyes  - Naphcon-A  Upset Stomach  - Bland diet (nothing spicy, greasy, fried, and high acid foods like tomatoes, oranges, berries) -OKAY- cereal, bread, soup, crackers, rice -Eat smaller more frequent meals -reduce caffeine, no alcohol -Loperamide (Imodium-AD) if diarrhea -Prevacid for heart burn  General health when sick  -Hydration -wash your hands frequently -keep surfaces clean -change pillow cases and sheets often -Get fresh air but do not exercise strenuously -Vitamin D, double up on it - Vitamin C -Zinc     Antibiotic Medicine, Adult Antibiotic medicines treat infections caused by a type of germ called bacteria. They work by killing the bacteria that make you sick. When do I need to take antibiotics? You often need these medicines to treat bacterial infections, such  as:  A urinary tract infection (UTI).  Strep throat.  Meningitis. This affects the spinal cord and brain.  A bad lung infection. You may start the medicines while your doctor waits for tests to come back. When the tests come back, your doctor may change or stop your medicine. When are  antibiotics not needed? You do not need these medicines for most common illnesses, such as:  A cold.  The flu.  A sore throat. Antibiotics are not always needed for all infections caused by bacteria. Do not ask for these medicines, or take them, when they are not needed. What are the risks of taking antibiotics? Most antibiotics can cause an infection called Clostridium difficile.This causes watery poop (diarrhea). Let your doctor know right away if:  You have watery poop while taking an antibiotic.  You have watery poop after you stop taking an antibiotic. The illness can happen weeks after you stop the medicine. You also have a risk of getting an infection in the future that antibiotics cannot treat (antibiotic-resistant infection). This type of infection can be dangerous. What else should I know about taking antibiotics?  You need to take the entire prescription.  Take the medicine for as long as told by your doctor.  Do not stop taking it even if you start to feel better.  Try not to miss any doses. If you miss a dose, call your doctor.  Birth control pills may not work. If you take birth control pills:  Keep on taking them.  Use a second form of birth control, such as a condom. Do this for as long as told by your doctor.  Ask your doctor:  How long to wait in between doses.  If you should take the medicine with food.  If there is anything you should stay away from while taking the antibiotic, such as:  Food.  Drinks.  Medicines.  If there are any side effects you should watch for.  Only take the medicines that your doctor told you to take. Do not take medicines that were given to someone else.  Drink a large glass of water with the medicine.  Ask the pharmacist for a tool to measure the medicine, such as:  A syringe.  A cup.  A spoon.  Throw away any extra medicine. Contact a doctor if:  You get worse.  You have new joint pain or muscle aches  after starting the medicine.  You have side effects from the medicine, such as:  Stomach pain.  Watery poop.  Feeling sick to your stomach (nausea). Get help right away if:  You have signs of a very bad allergic reaction. If this happens, stop taking the medicine right away. Signs may include:  Hives. These are raised, itchy, red bumps on the skin.  Skin rash.  Trouble breathing.  Wheezing.  Swelling.  Feeling dizzy.  Throwing up (vomiting).  Your pee (urine) is dark, or is the color of blood.  Your skin turns yellow.  You bruise easily.  You bleed easily.  You have very bad watery poop and cramps in your belly.  You have a very bad headache. Summary  Antibiotics are often used to treat infections caused by bacteria.  Only take these medicines when needed.  Let your doctor know if you have watery poop while taking an antibiotic.  You need to take the entire prescription. This information is not intended to replace advice given to you by your health care provider. Make sure you  discuss any questions you have with your health care provider. Document Released: 02/02/2008 Document Revised: 04/27/2016 Document Reviewed: 04/27/2016 Elsevier Interactive Patient Education  2017 [employer].

## 2016-09-29 NOTE — Progress Notes (Signed)
Patient ID: [name] [name], female   DOB: Oct 09, 1935, 81 y.o.   MRN: [id number]  Assessment and Plan:  Hypertension:  -Continue medication,  -monitor blood pressure at home.  -Continue DASH diet.   -Reminder to go to the ER if any CP, SOB, nausea, dizziness, severe HA, changes vision/speech, left arm numbness and tingling, and jaw pain.  Cholesterol: -Continue diet and exercise.  -Check cholesterol.   Pre-diabetes: -Continue diet and exercise.  -Check A1C  Vitamin D Def: -check level -continue medications.   Recurrent major depressive disorder, in partial remission ([place]) Can continue xanax for now, only on 1/2 1-2 x a day If any increase discussed that with oxycodone use at increase risk of unintentional overdose and would need to try SSRI/SNRI or counseling, has tried and failed celexa and cymbalta in the past.   Yeast dermatitis/oral yeast Watch for infection, if not better follow up -     nystatin (MYCOSTATIN) 100000 UNIT/ML suspension; Take 5 mLs (500,000 Units total) by mouth 4 (four) times daily. Swish and swallow -     fluconazole (DIFLUCAN) 150 MG tablet; Take 1 tablet (150 mg total) by mouth daily. -     ketoconazole (NIZORAL) 2 % cream; Apply 1 application topically 2 (two) times daily.     Future Appointments Date Time Provider [place]  12/09/2016 9:00 AM Jessy Oto, MD [facility] None  01/04/2017 10:30 AM [name], MD [facility] None  07/25/2017 10:00 AM [name], MD [facility] None    Continue diet and meds as discussed. Further disposition pending results of labs.  HPI 81 y.o. female  presents for 3 month follow up with hypertension, hyperlipidemia, prediabetes and vitamin D.   Her blood pressure has been controlled at home, today their BP is BP: 120/74.   She does not workout.  She cannot do a whole lot of exercise secondary to her back pain.   She denies chest pain, shortness of breath, dizziness.  Has severe back pain, following with  pain management, Dr. Hardin Negus, she is on xanax 0.5 1/2 pill 1-2 x a day and she is on oxycodone, has been on celexa and cymbalta in the past without help.  Was just treated for bronchitis with prednisone, ABX, has worsening rash inguinal folds of left leg and a little on right, red, painful, has been putting baby powder on it.    She is on cholesterol medication and denies myalgias. Her cholesterol is at goal. The cholesterol last visit was:   Lab Results  Component Value Date   CHOL 135 06/28/2016   HDL 63 06/28/2016   LDLCALC 52 06/28/2016   TRIG 99 06/28/2016   CHOLHDL 2.1 06/28/2016    She has not been working on diet and exercise for prediabetes, and denies foot ulcerations, nausea, paresthesia of the feet, polydipsia, polyuria, visual disturbances and vomiting. Last A1C in the office was:  Lab Results  Component Value Date   HGBA1C 5.6 06/28/2016   Patient is on Vitamin D supplement.  Lab Results  Component Value Date   VD25OH 51 06/28/2016     BMI is Body mass index is 24.3 kg/m., she is working on diet and exercise. Wt Readings from Last 3 Encounters:  09/30/16 146 lb (66.2 kg)  09/22/16 145 lb (65.8 kg)  09/08/16 138 lb (62.6 kg)    Current Medications:  Current Outpatient Prescriptions on File Prior to Visit  Medication Sig Dispense Refill  . acetaminophen (TYLENOL) 325 MG tablet Take 2 tablets (650  mg total) by mouth every 6 (six) hours as needed for mild pain (or Fever >/= 101). 20 tablet 0  . albuterol (PROAIR HFA) 108 (90 Base) MCG/ACT inhaler Inhale 2 puffs into the lungs every 6 (six) hours as needed for wheezing or shortness of breath. 18 g 2  . ALPRAZolam (XANAX) 0.5 MG tablet TAKE ONE-HALF TO ONE TABLET BY MOUTH TWICE DAILY TO THREE TIMES DAILY FOR  NEVERS 90 tablet 1  . amLODipine (NORVASC) 10 MG tablet Take 1 tablet (10 mg total) by mouth at bedtime. 30 tablet 1  . Cholecalciferol (VITAMIN D3) 2000 UNITS TABS Take 6,000 Units by mouth daily.     .  fluticasone (FLONASE) 50 MCG/ACT nasal spray Place 2 sprays into both nostrils at bedtime. 16 g 1  . ipratropium-albuterol (DUONEB) 0.5-2.5 (3) MG/3ML SOLN USE ONE VIAL IN NEBULIZER 4 TIMES DAILY (Patient taking differently: Inhale 3 mLs into the lungs every 6 (six) hours as needed. For shortness of breath) 360 mL 99  . losartan (COZAAR) 100 MG tablet TAKE ONE TABLET BY MOUTH ONCE DAILY ( DISCONTINUE BENICAR) 90 tablet 1  . magnesium oxide (MAG-OX) 400 MG tablet Take 400 mg by mouth daily.    . meclizine (ANTIVERT) 25 MG tablet 1/2-1 pill up to 3 times daily for motion sickness/dizziness 60 tablet 2  . metoprolol (LOPRESSOR) 50 MG tablet TAKE ONE TABLET BY MOUTH TWICE DAILY FOR BLOOD PRESSURE 180 tablet 1  . mometasone-formoterol (DULERA) 200-5 MCG/ACT AERO Inhale 2 puffs into the lungs 2 (two) times daily. (Patient taking differently: Inhale 2 puffs into the lungs 2 (two) times daily as needed for wheezing. ) 13 g 3  . neomycin-polymyxin b-dexamethasone (MAXITROL) 3.5-10000-0.1 OINT Apply to stye 4 x / day 3.5 g 3  . nitroGLYCERIN (NITROSTAT) 0.4 MG SL tablet Place 1 tablet (0.4 mg total) under the tongue every 5 (five) minutes as needed for chest pain. 90 tablet 3  . ondansetron (ZOFRAN-ODT) 8 MG disintegrating tablet DISSOLVE ONE TABLET IN MOUTH EVERY 8 HOURS AS NEEDED FOR NAUSEA AND VOMITING 30 tablet 5  . orphenadrine (NORFLEX) 100 MG tablet Take 1 tablet (100 mg total) by mouth 2 (two) times daily as needed for muscle spasms. 40 tablet 0  . OVER THE COUNTER MEDICATION Take 1,000 mg by mouth 2 (two) times daily. [place] Calcium 1000mg     . oxyCODONE (OXY IR/ROXICODONE) 5 MG immediate release tablet Take 1 tablet (5 mg total) by mouth every 4 (four) hours as needed for severe pain or breakthrough pain. (Patient taking differently: Take 5 mg by mouth every 6 (six) hours as needed for severe pain or breakthrough pain. ) 15 tablet 0  . pantoprazole (PROTONIX) 40 MG tablet Take 1 tablet (40 mg total) by  mouth daily. 90 tablet 1  . polyethylene glycol (MIRALAX / GLYCOLAX) packet Take 17 g by mouth 2 (two) times daily. 72 each 1  . potassium chloride SA (K-DUR,KLOR-CON) 20 MEQ tablet Take 1 tablet (20 mEq total) by mouth 2 (two) times daily. 30 tablet 0  . pravastatin (PRAVACHOL) 40 MG tablet TAKE ONE TABLET BY MOUTH ONCE DAILY 90 tablet 1  . predniSONE (DELTASONE) 20 MG tablet 2 tablets daily for 3 days, 1 tablet daily for 4 days. 10 tablet 0  . promethazine (PHENERGAN) 25 MG tablet Take 1 tablet (25 mg total) by mouth every 6 (six) hours as needed for nausea or vomiting. 100 tablet 0  . senna-docusate (SENOKOT-S) 8.6-50 MG tablet Take 2 tablets by  mouth 2 (two) times daily. 60 tablet 1  . silodosin (RAPAFLO) 8 MG CAPS capsule Take 8 mg by mouth every other day.     . sucralfate (CARAFATE) 1 g tablet TAKE ONE TABLET BY MOUTH 4 TIMES DAILY BEFORE MEALS AND AT BEDTIME FOR HEARTBURN 360 tablet 1   Current Facility-Administered Medications on File Prior to Visit  Medication Dose Route Frequency Provider Last Rate Last Dose  . promethazine (PHENERGAN) injection 25 mg  25 mg Intramuscular Once Vicie Mutters, PA-C        Medical History:  Past Medical History:  Diagnosis Date  . Anxiety    takes Xanax daily as needed  . Asthma    Albuterol daily as needed  . CAD (coronary artery disease)    LHC 2003 with 40% pLAD, 30% mLAD, 100% D1 (moderate vessel), 100%  D2 (small vessel), 95% mid small OM2.  Pt had PTCA to D1;  D2 and OM2 small vessels and tx medically;  Last Myoview (2012): anterior infarct seen with scar, no ischemia. EF normal. Pt managed medically;  Lexiscan Myoview (12/14):  Low risk; ant scar with very mild peri-infarct ischemia; EF 55% with ant AK   . Chronic back pain    HNP, DDD, spinal stenosis, vertebral compression fractures.   . Chronic nausea    takes Zofran daily as needed.  normal gastric emptying study 03/2013  . Constipation    felt to be functional. takes Senokot and  Miralax daily.  treated with Linzess in past.   . DIVERTICULOSIS, COLON 08/28/2008   Qualifier: Diagnosis of  By: [name], [name], [name]    . Elevated LFTs 2015   probably med induced DILI, from Augmentin vs Pravastatin  . GERD (gastroesophageal reflux disease)    hx of esophageal stricture   . Hiatal hernia    Paraesophageal hernias. s/p fundoplications in 2409 and 04/2013  . History of bronchitis several of yrs ago  . History of colon polyps 03/2009, 03/2011   adenomatous colon polyps, no high grade dysplasia.   [name] History of vertigo    no meds  . Hyperlipidemia    takes Pravastatin daily  . Hypertension    takes Amlodipine,Metoprolol,and Losartan daily  . Nausea 03/2016  . Osteoporosis   . PONV (postoperative nausea and vomiting)    yrs ago  . Slow urinary stream    takes Rapaflo daily  . TIA (transient ischemic attack) 1995   no residual effects noted    Allergies:  Allergies  Allergen Reactions  . Ace Inhibitors Cough    Per Dr Melvyn Novas pulmonology 2013  . Aspartame Diarrhea and Nausea And Vomiting  . Atorvastatin Other (See Comments)    Muscle aches  . Biaxin [Clarithromycin] Other (See Comments)    PATIENT CAN TOLERATE Z-PAK.  Is written on patient's paper chart.  [name] [Roflumilast] Nausea And Vomiting  . Erythromycin Other (See Comments)    PATIENT CAN TOLERATE Z-PAK.  It is written on patient's paper chart.  . Levofloxacin Nausea And Vomiting     Review of Systems:  Review of Systems  Constitutional: Positive for malaise/fatigue. Negative for chills, diaphoresis, fever and weight loss.  HENT: Negative for congestion, ear discharge, sore throat and tinnitus.   Respiratory: Negative for cough, sputum production, shortness of breath and wheezing.   Cardiovascular: Negative for chest pain, palpitations and leg swelling.  Gastrointestinal: Negative for abdominal pain, blood in stool, constipation, diarrhea, heartburn, melena, nausea and vomiting.  Genitourinary:  Negative for dysuria, frequency  and urgency.  Musculoskeletal: Positive for back pain.  Skin: Positive for itching and rash.  Neurological: Negative for dizziness, sensory change, focal weakness, loss of consciousness and headaches.    Family history- Review and unchanged  Social history- Review and unchanged  Physical Exam: BP 120/74   Pulse 70   Temp 97.5 F (36.4 C)   Resp 14   Ht 5\' 5"  (1.651 m)   Wt 146 lb (66.2 kg)   SpO2 97%   BMI 24.30 kg/m  Wt Readings from Last 3 Encounters:  09/30/16 146 lb (66.2 kg)  09/22/16 145 lb (65.8 kg)  09/08/16 138 lb (62.6 kg)    General Appearance: Well nourished well developed, Dressed nicely in no apparent distress. Eyes: PERRLA, EOMs, conjunctiva no swelling or erythema ENT/Mouth: Ear canals normal without obstruction, swelling, erythma, discharge.  TMs normal bilaterally.  Oropharynx moist, clear, without exudate, or postoropharyngeal with small erythema/scrapable white plaque. Neck: Supple, thyroid normal,no cervical adenopathy  Respiratory: Respiratory effort normal, Breath sounds clear A&P without rhonchi, wheeze, or rale.  No retractions, no accessory usage. Cardio: RRR with no MRGs. Brisk peripheral pulses without edema. 2+ DP, PT pulses bilaterally Abdomen: Soft, + BS,  Non tender, no guarding, rebound, hernias, masses. Musculoskeletal: Full ROM, 5/5 strength, antaglic gait, mild skeletal kyphosis Skin: Left integral fold/inguinal area with well circumscribed erythematous areas with broken skin at the folds. Warm, dry without rashes, lesions, ecchymosis.  Neuro: Awake and oriented X 3, Cranial nerves intact. Normal muscle tone, no cerebellar symptoms. Psych: Normal affect, Insight and Judgment appropriate.    Vicie Mutters, PA-C 10:05 AM [facility]

## 2016-09-30 ENCOUNTER — Ambulatory Visit (INDEPENDENT_AMBULATORY_CARE_PROVIDER_SITE_OTHER): Payer: PPO | Admitting: Physician Assistant

## 2016-09-30 ENCOUNTER — Encounter: Payer: Self-pay | Admitting: Physician Assistant

## 2016-09-30 VITALS — BP 120/74 | HR 70 | Temp 97.5°F | Resp 14 | Ht 65.0 in | Wt 146.0 lb

## 2016-09-30 DIAGNOSIS — I10 Essential (primary) hypertension: Secondary | ICD-10-CM

## 2016-09-30 DIAGNOSIS — R7303 Prediabetes: Secondary | ICD-10-CM | POA: Diagnosis not present

## 2016-09-30 DIAGNOSIS — E559 Vitamin D deficiency, unspecified: Secondary | ICD-10-CM | POA: Diagnosis not present

## 2016-09-30 DIAGNOSIS — B372 Candidiasis of skin and nail: Secondary | ICD-10-CM | POA: Diagnosis not present

## 2016-09-30 DIAGNOSIS — F3341 Major depressive disorder, recurrent, in partial remission: Secondary | ICD-10-CM | POA: Diagnosis not present

## 2016-09-30 DIAGNOSIS — IMO0001 Reserved for inherently not codable concepts without codable children: Secondary | ICD-10-CM

## 2016-09-30 DIAGNOSIS — Z79899 Other long term (current) drug therapy: Secondary | ICD-10-CM | POA: Diagnosis not present

## 2016-09-30 DIAGNOSIS — E782 Mixed hyperlipidemia: Secondary | ICD-10-CM

## 2016-09-30 DIAGNOSIS — M4850XS Collapsed vertebra, not elsewhere classified, site unspecified, sequela of fracture: Secondary | ICD-10-CM

## 2016-09-30 LAB — CBC WITH DIFFERENTIAL/PLATELET
Basophils Absolute: 0 {cells}/uL (ref 0–200)
Basophils Relative: 0 %
Eosinophils Absolute: 204 {cells}/uL (ref 15–500)
Eosinophils Relative: 2 %
HCT: 42.8 % (ref 35.0–45.0)
Hemoglobin: 14.2 g/dL (ref 11.7–15.5)
Lymphocytes Relative: 19 %
Lymphs Abs: 1938 {cells}/uL (ref 850–3900)
MCH: 31 pg (ref 27.0–33.0)
MCHC: 33.2 g/dL (ref 32.0–36.0)
MCV: 93.4 fL (ref 80.0–100.0)
MPV: 10.3 fL (ref 7.5–12.5)
Monocytes Absolute: 714 {cells}/uL (ref 200–950)
Monocytes Relative: 7 %
Neutro Abs: 7344 {cells}/uL (ref 1500–7800)
Neutrophils Relative %: 72 %
Platelets: 341 10*3/uL (ref 140–400)
RBC: 4.58 MIL/uL (ref 3.80–5.10)
RDW: 14.9 % (ref 11.0–15.0)
WBC: 10.2 10*3/uL (ref 3.8–10.8)

## 2016-09-30 LAB — TSH: TSH: 0.61 m[IU]/L

## 2016-09-30 MED ORDER — FLUCONAZOLE 150 MG PO TABS: 150.0000 mg | ORAL_TABLET | Freq: Every day | ORAL | 3 refills | Status: DC

## 2016-09-30 MED ORDER — KETOCONAZOLE 2 % EX CREA: 1.0000 "application " | TOPICAL_CREAM | Freq: Two times a day (BID) | CUTANEOUS | 1 refills | Status: DC

## 2016-09-30 MED ORDER — NYSTATIN 100000 UNIT/ML MT SUSP: 5.0000 mL | Freq: Four times a day (QID) | OROMUCOSAL | 1 refills | Status: DC

## 2016-09-30 NOTE — Patient Instructions (Signed)
Patient is on opioids as well as benzos, due to new guidelines we discussed decreasing benzo use. Went into great detail and length that new studies show that unintential overdose was most likely to occur with concurrent benzo use, that opioids are better acute and short term pain management and long term recent studies show that patients on long term opioid use have worse outcomes and more complications than other medications.   New guidelines suggest the benzodiazepines are best short term, with prolonged use they lead to physical and psychological dependence. In addition, evidence suggest that for insomnia the effectiveness wanes in 4 weeks and the risks out weight their benefits. Use of these agents have been associated with dementia, falls, motor vehicle accidents and physical addiction. Decreasing these medication have been proven to show improvements in cognition, alertness, decrease of falls and daytime sedation.   We will start a slow taper, symptoms of withdrawal include, insomnia, anxiety, irritability, sweating and stomach or intestinal symptoms like diarrhea or nausea.    Can do apple cider vinegar in tea  Skin Yeast Infection Skin yeast infection is a condition in which there is an overgrowth of yeast (candida) that normally lives on the skin. This condition usually occurs in areas of the skin that are constantly warm and moist, such as the armpits or the groin. What are the causes? This condition is caused by a change in the normal balance of the yeast and bacteria that live on the skin. What increases the risk? This condition is more likely to develop in:  People who are obese.  Pregnant women.  Women who take birth control pills.  People who have diabetes.  People who take antibiotic medicines.  People who take steroid medicines.  People who are malnourished.  People who have a weak defense (immune) system.  People who are 79 years of age or older. What are the signs  or symptoms? Symptoms of this condition include:  A red, swollen area of the skin.  Bumps on the skin.  Itchiness. How is this diagnosed? This condition is diagnosed with a medical history and physical exam. Your health care provider may check for yeast by taking light scrapings of the skin to be viewed under a microscope. How is this treated? This condition is treated with medicine. Medicines may be prescribed or be available over-the-counter. The medicines may be:  Taken by mouth (orally).  Applied as a cream. Follow these instructions at home:  Take or apply over-the-counter and prescription medicines only as told by your health care provider.  Eat more yogurt. This may help to keep your yeast infection from returning.  Maintain a healthy weight. If you need help losing weight, talk with your health care provider.  Keep your skin clean and dry.  If you have diabetes, keep your blood sugar under control. Contact a health care provider if:  Your symptoms go away and then return.  Your symptoms do not get better with treatment.  Your symptoms get worse.  Your rash spreads.  You have a fever or chills.  You have new symptoms.  You have new warmth or redness of your skin. This information is not intended to replace advice given to you by your health care provider. Make sure you discuss any questions you have with your health care provider. Document Released: 01/11/2011 Document Revised: 12/20/2015 Document Reviewed: 10/27/2014 Elsevier Interactive Patient Education  2017 [employer].

## 2016-10-01 LAB — MAGNESIUM: Magnesium: 2.1 mg/dL (ref 1.5–2.5)

## 2016-10-01 LAB — HEPATIC FUNCTION PANEL
ALT: 15 U/L (ref 6–29)
AST: 17 U/L (ref 10–35)
Albumin: 3.8 g/dL (ref 3.6–5.1)
Alkaline Phosphatase: 55 U/L (ref 33–130)
Bilirubin, Direct: 0.1 mg/dL
Indirect Bilirubin: 0.4 mg/dL (ref 0.2–1.2)
Total Bilirubin: 0.5 mg/dL (ref 0.2–1.2)
Total Protein: 6.6 g/dL (ref 6.1–8.1)

## 2016-10-01 LAB — BASIC METABOLIC PANEL WITH GFR
BUN: 18 mg/dL (ref 7–25)
CALCIUM: 9.4 mg/dL (ref 8.6–10.4)
CO2: 25 mmol/L (ref 20–31)
CREATININE: 0.95 mg/dL — AB (ref 0.60–0.93)
Chloride: 102 mmol/L (ref 98–110)
GFR, Est African American: 66 mL/min (ref >=60)
GFR, Est Non African American: 58 mL/min — ABNORMAL LOW (ref >=60)
Glucose, Bld: 97 mg/dL (ref 65–99)
Potassium: 4.5 mmol/L (ref 3.5–5.3)
SODIUM: 137 mmol/L (ref 135–146)

## 2016-10-01 LAB — VITAMIN D 25 HYDROXY (VIT D DEFICIENCY, FRACTURES): VIT D 25 HYDROXY: 69 ng/mL (ref 30–100)

## 2016-10-01 LAB — LIPID PANEL
Cholesterol: 131 mg/dL
HDL: 60 mg/dL
LDL Cholesterol: 51 mg/dL
Total CHOL/HDL Ratio: 2.2 ratio
Triglycerides: 100 mg/dL
VLDL: 20 mg/dL

## 2016-10-01 LAB — HEMOGLOBIN A1C
Hgb A1c MFr Bld: 5.6 % (ref <5.7)
Mean Plasma Glucose: 114 mg/dL

## 2016-10-04 NOTE — Progress Notes (Signed)
Pt aware of lab results & voiced understanding of those results.

## 2016-10-10 DIAGNOSIS — Z79891 Long term (current) use of opiate analgesic: Secondary | ICD-10-CM | POA: Diagnosis not present

## 2016-10-10 DIAGNOSIS — M961 Postlaminectomy syndrome, not elsewhere classified: Secondary | ICD-10-CM | POA: Diagnosis not present

## 2016-10-10 DIAGNOSIS — M4726 Other spondylosis with radiculopathy, lumbar region: Secondary | ICD-10-CM | POA: Diagnosis not present

## 2016-10-10 DIAGNOSIS — G894 Chronic pain syndrome: Secondary | ICD-10-CM | POA: Diagnosis not present

## 2016-10-13 ENCOUNTER — Other Ambulatory Visit (INDEPENDENT_AMBULATORY_CARE_PROVIDER_SITE_OTHER): Payer: Self-pay | Admitting: Specialist

## 2016-10-13 DIAGNOSIS — Z981 Arthrodesis status: Secondary | ICD-10-CM

## 2016-10-13 DIAGNOSIS — K5909 Other constipation: Secondary | ICD-10-CM

## 2016-10-13 MED ORDER — POLYETHYLENE GLYCOL 3350 17 G PO PACK: 17.0000 g | PACK | Freq: Two times a day (BID) | ORAL | 1 refills | Status: DC

## 2016-10-13 NOTE — Telephone Encounter (Signed)
Patient called needing Rx refilled (Polyethylene  Glycol) Patient advised she is almost out of her medication. The number to contact patient is [phone]

## 2016-10-17 ENCOUNTER — Encounter (INDEPENDENT_AMBULATORY_CARE_PROVIDER_SITE_OTHER): Payer: Self-pay | Admitting: Specialist

## 2016-10-17 ENCOUNTER — Ambulatory Visit (INDEPENDENT_AMBULATORY_CARE_PROVIDER_SITE_OTHER): Payer: PPO | Admitting: Specialist

## 2016-10-17 VITALS — BP 126/77 | HR 90 | Ht 65.0 in | Wt 146.0 lb

## 2016-10-17 DIAGNOSIS — T84498A Other mechanical complication of other internal orthopedic devices, implants and grafts, initial encounter: Secondary | ICD-10-CM | POA: Diagnosis not present

## 2016-10-17 DIAGNOSIS — M5416 Radiculopathy, lumbar region: Secondary | ICD-10-CM

## 2016-10-17 NOTE — Progress Notes (Signed)
Office Visit Note   Patient: [name] [name]           Date of Birth: 02/05/1936           MRN: [id number] Visit Date: 10/17/2016              Requested by: [name], [address] [place], [place] [address] PCP: [name], MD   Assessment & Plan: Visit Diagnoses:  1. Loosening of hardware in spine (HCC)   2. Radiculopathy, lumbar region     Plan: Avoid bending, stooping and avoid lifting weights greater than 10 lbs. Avoid prolong standing and walking. Avoid frequent bending and stooping  No lifting greater than 10 lbs. May use ice or moist heat for pain. Weight loss is of benefit. Handicap license is approved. Dr. Romona Curls secretary/Assistant will call to arrange for epidural steroid injection  If there is good relief with blocking the L2 nerve root then we would need to consider decompressing the right L2 root, if no relief then extending fixation To T12 which is autofused to L1 and replacing the L1 pedicle screws with cement fixation would be recommended.  Follow-Up Instructions: Return in about 2 weeks (around 10/31/2016).   Orders:  Orders Placed This Encounter  Procedures  . Ambulatory referral to Physical Medicine Rehab   No orders of the defined types were placed in this encounter.     Procedures: No procedures performed   Clinical Data: No additional findings.   Subjective: Chief Complaint  Patient presents with  . Lower Back - Follow-up, Pain    Having increased pain in back and right leg.  So painful she is nausated    81 year old female with persisting pain in her back with radiation into the right L3 distribution. Did well for nearly 8 months then had worsening pain into the right leg and right buttock. Plain radiographs with loosening of L1 pedicle screws, right pedicle is fractured occurred during surgery resulting in the need to extend fusion to L1 pedicles and posterior fusion. Pedicle screws with cement are  without loosening at L3. Cement is extravasated with the right L2 pedicle screw, initially not causing pain but with the recent loosening of screws at L1 may be the source of her recurring pain right leg. .    Review of Systems  Constitutional: Negative.   HENT: Negative.   Eyes: Negative.   Respiratory: Negative.   Cardiovascular: Negative.   Gastrointestinal: Negative.   Endocrine: Negative.   Genitourinary: Negative.   Musculoskeletal: Negative.   Skin: Negative.   Allergic/Immunologic: Negative.   Neurological: Negative.   Hematological: Negative.   Psychiatric/Behavioral: Negative.      Objective: Vital Signs: BP 126/77 (BP Location: Left Arm, Patient Position: Sitting)   Pulse 90   Ht 5\' 5"  (1.651 m)   Wt 146 lb (66.2 kg)   BMI 24.30 kg/m   Physical Exam  Constitutional: She is oriented to person, place, and time. She appears well-developed and well-nourished.  HENT:  Head: Normocephalic and atraumatic.  Eyes: EOM are normal. Pupils are equal, round, and reactive to light.  Neck: Normal range of motion. Neck supple.  Pulmonary/Chest: Effort normal and breath sounds normal.  Abdominal: Soft. Bowel sounds are normal.  Neurological: She is alert and oriented to person, place, and time.  Skin: Skin is warm and dry.  Psychiatric: She has a normal mood and affect. Her behavior is normal. Judgment and thought content normal.    Back  Exam   Tenderness  The patient is experiencing tenderness in the lumbar.  Range of Motion  Extension: abnormal  Flexion: abnormal  Lateral Bend Right: abnormal  Lateral Bend Left: abnormal  Rotation Right: abnormal  Rotation Left: normal   Muscle Strength  Right Quadriceps:  4/5  Left Quadriceps:  4/5  Right Hamstrings:  4/5  Left Hamstrings:  4/5   Tests  Straight leg raise right: negative Straight leg raise left: negative  Reflexes  Patellar: abnormal Achilles: normal Biceps: normal Babinski's sign: normal   Other    Toe Walk: normal Heel Walk: normal Sensation: normal Gait: normal  Erythema: no back redness Scars: absent      Specialty Comments:  No specialty comments available.  Imaging: No results found.   PMFS History: Patient Active Problem List   Diagnosis Date Noted  . Herniated lumbar intervertebral disc 06/25/2012    Priority: High    Class: Acute  . Anxiety state 06/08/2016  . History of lumbar spinal fusion 04/13/2016  . Spondylolisthesis, lumbar region 04/13/2016  . Leukocytosis 03/28/2016  . Ileus ([place])   . Spinal stenosis, lumbar region, with neurogenic claudication 05/29/2015  . DDD (degenerative disc disease), lumbar 04/21/2015  . Encounter for Medicare annual wellness exam 02/27/2015  . Mixed hyperlipidemia 11/24/2014  . GERD  11/24/2014  . Depression, major, in partial remission ([place]) 09/26/2014  . Chronic pain syndrome 09/26/2014  . Coronary artery disease due to lipid rich plaque   . Osteoporosis 02/10/2014  . Prediabetes 08/14/2013  . Vitamin D deficiency 08/14/2013  . Medication management 08/14/2013  . Constipation, chronic 04/03/2013  . MGUS (monoclonal gammopathy of unknown significance) 03/05/2013  . Vertebral compression fracture ([place]) 06/23/2012  . ESOPHAGEAL STRICTURE 08/28/2008  . Asthma 07/29/2008  . Incarcerated recurrent paraesophageal hiatal hernia s/p lap redo repair [id number] 01/31/2008  . Essential hypertension 07/25/2007   Past Medical History:  Diagnosis Date  . Anxiety    takes Xanax daily as needed  . Asthma    Albuterol daily as needed  . CAD (coronary artery disease)    LHC 2003 with 40% pLAD, 30% mLAD, 100% D1 (moderate vessel), 100%  D2 (small vessel), 95% mid small OM2.  Pt had PTCA to D1;  D2 and OM2 small vessels and tx medically;  Last Myoview (2012): anterior infarct seen with scar, no ischemia. EF normal. Pt managed medically;  Lexiscan Myoview (12/14):  Low risk; ant scar with very mild peri-infarct ischemia; EF 55% with  ant AK   . Chronic back pain    HNP, DDD, spinal stenosis, vertebral compression fractures.   . Chronic nausea    takes Zofran daily as needed.  normal gastric emptying study 03/2013  . Constipation    felt to be functional. takes Senokot and Miralax daily.  treated with Linzess in past.   . DIVERTICULOSIS, COLON 08/28/2008   Qualifier: Diagnosis of  By: [name], [name], [name]    . Elevated LFTs 2015   probably med induced DILI, from Augmentin vs Pravastatin  . GERD (gastroesophageal reflux disease)    hx of esophageal stricture   . Hiatal hernia    Paraesophageal hernias. s/p fundoplications in 7622 and 04/2013  . History of bronchitis several of yrs ago  . History of colon polyps 03/2009, 03/2011   adenomatous colon polyps, no high grade dysplasia.   [name] History of vertigo    no meds  . Hyperlipidemia    takes Pravastatin daily  . Hypertension    takes Amlodipine,Metoprolol,and  Losartan daily  . Nausea 03/2016  . Osteoporosis   . PONV (postoperative nausea and vomiting)    yrs ago  . Slow urinary stream    takes Rapaflo daily  . TIA (transient ischemic attack) 1995   no residual effects noted    Family History  Problem Relation Age of Onset  . Colon cancer Mother 55  . Heart disease Father 88       heart attack    Past Surgical History:  Procedure Laterality Date  . APPENDECTOMY     patient unsure of date  . BACK SURGERY    . BLADDER REPAIR    . CHOLECYSTECTOMY     Patient unsure of date.  . CORONARY ANGIOPLASTY  11/16/2001   1 stent  . ESOPHAGEAL MANOMETRY N/A 03/25/2013   Procedure: ESOPHAGEAL MANOMETRY (EM);  Surgeon: Inda Castle, MD;  Location: [facility];  Service: Endoscopy;  Laterality: N/A;  . ESOPHAGOGASTRODUODENOSCOPY N/A 03/15/2013   Procedure: ESOPHAGOGASTRODUODENOSCOPY (EGD);  Surgeon: Jerene Bears, MD;  Location: [facility] ENDOSCOPY;  Service: Endoscopy;  Laterality: N/A;  . ESOPHAGOGASTRODUODENOSCOPY N/A 12/25/2015   Procedure: ESOPHAGOGASTRODUODENOSCOPY  (EGD);  Surgeon: Doran Stabler, MD;  [facility] END[facility]COPY;  Service: Endoscopy;  Laterality: N/A;  . EYE SURGERY  11/2000   bilateral cataracts with lens implant  . FIXATION KYPHOPLASTY LUMBAR SPINE  X 2   "L3-4"  . HEMORRHOID SURGERY    . HIATAL HERNIA REPAIR  06/03/2011   Procedure: LAPAROSCOPIC REPAIR OF HIATAL HERNIA;  Surgeon: Adin Hector, MD;  Location: [facility] ORS;  Service: General;  Laterality: N/A;  . HIATAL HERNIA REPAIR N/A 04/24/2013   Procedure: LAPAROSCOPIC  REPAIR RECURRENT PARASOPHAGEAL HIATAL HERNIA WITH FUNDOPLICATION;  Surgeon: Adin Hector, MD;  Location: [facility];  Service: General;  Laterality: N/A;  . INSERTION OF MESH N/A 04/24/2013   Procedure: INSERTION OF MESH;  Surgeon: Adin Hector, MD;  Location: [facility];  Service: General;  Laterality: N/A;  . LAPAROSCOPIC LYSIS OF ADHESIONS N/A 04/24/2013   Procedure: LAPAROSCOPIC LYSIS OF ADHESIONS;  Surgeon: Adin Hector, MD;  Location: [facility];  Service: General;  Laterality: N/A;  . LAPAROSCOPIC NISSEN FUNDOPLICATION  11/03/3149   Procedure: LAPAROSCOPIC NISSEN FUNDOPLICATION;  Surgeon: Adin Hector, MD;  Location: [facility];  Service: General;  Laterality: N/A;  . LUMBAR FUSION  12/07/2015   Right L2-3 facetectomy, posterolateral fusion L2-3 with pedicle screws, rods, local bone graft, Vivigen cancellous chips. Fusion extended to the L1 level with pedicle screws and rods  . LUMBAR LAMINECTOMY/DECOMPRESSION MICRODISCECTOMY Right 06/26/2012   Procedure: LUMBAR LAMINECTOMY/DECOMPRESSION MICRODISCECTOMY;  Surgeon: Jessy Oto, MD;  Location: [facility];  Service: Orthopedics;  Laterality: Right;  RIGHT L4-5 MICRODISCECTOMY  . LUMBAR LAMINECTOMY/DECOMPRESSION MICRODISCECTOMY N/A 05/29/2015   Procedure: RIGHT L2-3 MICRODISCECTOMY;  Surgeon: Jessy Oto, MD;  Location: [facility];  Service: Orthopedics;  Laterality: N/A;  . TOTAL ABDOMINAL HYSTERECTOMY  1975   partial   Social History   Occupational History  . Retired  Retired    worked at [employer]  . Smoking status: Never Smoker  . Smokeless tobacco: Never Used  . Alcohol use No  . Drug use: No  . Sexual activity: No

## 2016-10-17 NOTE — Patient Instructions (Signed)
Avoid bending, stooping and avoid lifting weights greater than 10 lbs. Avoid prolong standing and walking. Avoid frequent bending and stooping  No lifting greater than 10 lbs. May use ice or moist heat for pain. Weight loss is of benefit. Handicap license is approved. Dr. Romona Curls secretary/Assistant will call to arrange for epidural steroid injection  If there is good relief with blocking the L2 nerve root then we would need to consider decompressing the right L2 root, if no relief then extending fixation To T12 which is autofused to L1 and replacing the L1 pedicle screws with cement fixation would be recommended.

## 2016-10-24 ENCOUNTER — Telehealth (INDEPENDENT_AMBULATORY_CARE_PROVIDER_SITE_OTHER): Payer: Self-pay

## 2016-10-24 NOTE — Telephone Encounter (Signed)
Please advise 

## 2016-10-24 NOTE — Telephone Encounter (Signed)
Patient would like to know if Dr. Louanne Skye can get her en earlier appointmnet with Dr. Ernestina Patches.  Advised patient that appointment is schedule for Monday 10/31/2016, but patient stated that she can not wait that long and the pain is very bad. Cb# is [phone].  Please advise.

## 2016-10-24 NOTE — Telephone Encounter (Signed)
Already spoke to pt earlier this morning and r/s her to 10/27/16 @ 2:00

## 2016-10-27 ENCOUNTER — Ambulatory Visit (INDEPENDENT_AMBULATORY_CARE_PROVIDER_SITE_OTHER): Payer: PPO | Admitting: Physical Medicine and Rehabilitation

## 2016-10-27 ENCOUNTER — Ambulatory Visit (INDEPENDENT_AMBULATORY_CARE_PROVIDER_SITE_OTHER): Payer: PPO

## 2016-10-27 ENCOUNTER — Encounter (INDEPENDENT_AMBULATORY_CARE_PROVIDER_SITE_OTHER): Payer: Self-pay | Admitting: Physical Medicine and Rehabilitation

## 2016-10-27 VITALS — BP 132/84 | HR 84

## 2016-10-27 DIAGNOSIS — M5416 Radiculopathy, lumbar region: Secondary | ICD-10-CM | POA: Diagnosis not present

## 2016-10-27 MED ORDER — LIDOCAINE HCL (PF) 1 % IJ SOLN
2.0000 mL | Freq: Once | INTRAMUSCULAR | Status: AC
  Administered 2016-10-27: 2 mL

## 2016-10-27 MED ORDER — DEXAMETHASONE SODIUM PHOSPHATE 10 MG/ML IJ SOLN
15.0000 mg | Freq: Once | INTRAMUSCULAR | Status: AC
  Administered 2016-10-27: 15 mg

## 2016-10-27 NOTE — Patient Instructions (Signed)

## 2016-10-27 NOTE — Progress Notes (Deleted)
Pain across lower back and down right leg to just below knee. Constant back pain and leg pain with walking.States she only wears back brace when she goes out. Denies numbness or tingling. Pain worse with walking and better with sitting.

## 2016-10-27 NOTE — Procedures (Signed)
Lumbosacral Transforaminal Epidural Steroid Injection - Infraneural Approach with Fluoroscopic Guidance  Patient: [name] [name]      Date of Birth: [birth year] MRN: [id number] PCP: [name], MD      Visit Date: 10/27/2016   [name] is a 81 year old female patient of Dr. Otho Ket. She has had multiple back surgeries and fusion from L1 down through L3. She's had for vertebral augmentation as well. She was doing well for a while now has again right hip and leg pain. Dr. Louanne Skye requests a right L2 transforaminal injection diagnostically. She does have a CT scan is reviewed today that does show loosening at L1 and Dr. Louanne Skye thinks that something around the L2 nerve root may be irritated.  Universal Protocol:     Consent Given By: the patient  Position: PRONE   Additional Comments: Vital signs were monitored before and after the procedure. Patient was prepped and draped in the usual sterile fashion. The correct patient, procedure, and site was verified.   Injection Procedure Details:  Procedure Site One Meds Administered:  Meds ordered this encounter  Medications  . lidocaine (PF) (XYLOCAINE) 1 % injection 2 mL  . dexamethasone (DECADRON) injection 15 mg      Laterality: Right  Location/Site:  L2-L3  Needle size: 22 G  Needle type: Spinal  Needle Placement: Transforaminal  Findings:  -Contrast Used: 1 mL iohexol 180 mg iodine/mL   -Comments: Excellent flow of contrast along the nerve and into the epidural space.  Procedure Details: After squaring off the end-plates of the desired vertebral level to get a true AP view, the C-arm was obliqued to the painful side so that the superior articulating process is positioned about 1/3 the length of the inferior endplate.  The needle was aimed toward the junction of the superior articular process and the transverse process of the inferior vertebrae. The needle's initial entry is in the lower third of the foramen through Kambin's  triangle. The soft tissues overlying this target were infiltrated with 2-3 ml. of 1% Lidocaine without Epinephrine.  The spinal needle was then inserted and advanced toward the target using a "trajectory" view along the fluoroscope beam.  Under AP and lateral visualization, the needle was advanced so it did not puncture dura and did not traverse medially beyond the 6 o'clock position of the pedicle. Bi-planar projections were used to confirm position. Aspiration was confirmed to be negative for CSF and/or blood. A 1-2 ml. volume of Isovue-250 was injected and flow of contrast was noted at each level. Radiographs were obtained for documentation purposes.   After attaining the desired flow of contrast documented above, a 0.5 to 1.0 ml test dose of 0.25% Marcaine was injected into each respective transforaminal space.  The patient was observed for 90 seconds post injection.  After no sensory deficits were reported, and normal lower extremity motor function was noted,   the above injectate was administered so that equal amounts of the injectate were placed at each foramen (level) into the transforaminal epidural space.   Additional Comments:  The patient tolerated the procedure well Dressing: Band-Aid    Post-procedure details: Patient was observed during the procedure. Post-procedure instructions were reviewed.  Patient left the clinic in stable condition.

## 2016-10-28 ENCOUNTER — Other Ambulatory Visit: Payer: Self-pay | Admitting: Internal Medicine

## 2016-10-31 ENCOUNTER — Encounter (INDEPENDENT_AMBULATORY_CARE_PROVIDER_SITE_OTHER): Payer: PPO | Admitting: Physical Medicine and Rehabilitation

## 2016-11-03 ENCOUNTER — Telehealth (INDEPENDENT_AMBULATORY_CARE_PROVIDER_SITE_OTHER): Payer: Self-pay | Admitting: Radiology

## 2016-11-03 NOTE — Telephone Encounter (Signed)
Called patient and left message for her to call back to discuss. She had an [facility] 10/27/16, so she has not waited the full time for the injection to take effect as advised by Dr. Ernestina Patches. Will require prior auth from insurance before another injection can be done.

## 2016-11-04 NOTE — Telephone Encounter (Signed)
Patient called back and said that she did get some relief from the injection, but she was wondering what Dr. Ernestina Patches wanted to schedule for her after 2 weeks. I advised her that it could take up to 2 weeks for the injection to take full effect and that she should call us back after 2 weeks to let us know if it has not helped so Dr. Ernestina Patches can advise on what her next steps should be.

## 2016-11-08 DIAGNOSIS — M4726 Other spondylosis with radiculopathy, lumbar region: Secondary | ICD-10-CM | POA: Diagnosis not present

## 2016-11-08 DIAGNOSIS — Z79891 Long term (current) use of opiate analgesic: Secondary | ICD-10-CM | POA: Diagnosis not present

## 2016-11-08 DIAGNOSIS — M961 Postlaminectomy syndrome, not elsewhere classified: Secondary | ICD-10-CM | POA: Diagnosis not present

## 2016-11-08 DIAGNOSIS — G894 Chronic pain syndrome: Secondary | ICD-10-CM | POA: Diagnosis not present

## 2016-11-14 ENCOUNTER — Ambulatory Visit (INDEPENDENT_AMBULATORY_CARE_PROVIDER_SITE_OTHER): Payer: PPO | Admitting: Physician Assistant

## 2016-11-14 ENCOUNTER — Encounter: Payer: Self-pay | Admitting: Physician Assistant

## 2016-11-14 ENCOUNTER — Ambulatory Visit (INDEPENDENT_AMBULATORY_CARE_PROVIDER_SITE_OTHER): Payer: PPO | Admitting: Family

## 2016-11-14 VITALS — BP 120/66 | HR 77 | Temp 98.2°F | Resp 16 | Ht 65.0 in | Wt 141.0 lb

## 2016-11-14 DIAGNOSIS — M940 Chondrocostal junction syndrome [Tietze]: Secondary | ICD-10-CM

## 2016-11-14 DIAGNOSIS — IMO0001 Reserved for inherently not codable concepts without codable children: Secondary | ICD-10-CM

## 2016-11-14 DIAGNOSIS — M4850XS Collapsed vertebra, not elsewhere classified, site unspecified, sequela of fracture: Principal | ICD-10-CM

## 2016-11-14 DIAGNOSIS — N644 Mastodynia: Secondary | ICD-10-CM

## 2016-11-14 NOTE — Patient Instructions (Addendum)
Please do the following: Purchase a bottle of Miralax over the counter as well as a box of 5 mg dulcolax tablets. Take 4 dulcolax tablets. Wait 1 hour. You will then drink 6-8 capfuls of Miralax mixed in an adequate amount of water/juice/gatorade (you may choose which of these liquids to drink) over the next 2-3 hours. You should expect results within 1 to 6 hours after completing the bowel purge. Go to the er if you have severe AB pain, can not pass gas or stool in over 12 hours, can not hold down any food.

## 2016-11-14 NOTE — Progress Notes (Addendum)
Subjective:    Patient ID: [name] [name], female    DOB: 1935-07-28, 81 y.o.   MRN: [id number]  HPI 81 y.o. WF with history of OA, spinal stenosis, hx compression fracture presents with right flank/breast pain, radiates around to her back, x 2-3 days, not work with cough, deep breath, worse with movement. She has had nausea and constipation (BM yesterday still passing gas), no vomiting, no fever or chills, no SOB, CP, no diarrhea. She follows with Dr. Louanne Skye for back pain.   Blood pressure 120/66, pulse 77, temperature 98.2 F (36.8 C), resp. rate 16, height 5\' 5"  (1.651 m), weight 141 lb (64 kg), SpO2 96 %.  Medications Current Outpatient Prescriptions on File Prior to Visit  Medication Sig  . acetaminophen (TYLENOL) 325 MG tablet Take 2 tablets (650 mg total) by mouth every 6 (six) hours as needed for mild pain (or Fever >/= 101).  [name] albuterol (PROAIR HFA) 108 (90 Base) MCG/ACT inhaler Inhale 2 puffs into the lungs every 6 (six) hours as needed for wheezing or shortness of breath.  . ALPRAZolam (XANAX) 0.5 MG tablet TAKE ONE-HALF TO ONE TABLET BY MOUTH TWICE DAILY TO THREE TIMES DAILY FOR  NEVERS  . amLODipine (NORVASC) 10 MG tablet Take 1 tablet (10 mg total) by mouth at bedtime.  . Cholecalciferol (VITAMIN D3) 2000 UNITS TABS Take 6,000 Units by mouth daily.   . fluconazole (DIFLUCAN) 150 MG tablet Take 1 tablet (150 mg total) by mouth daily.  . fluticasone (FLONASE) 50 MCG/ACT nasal spray Place 2 sprays into both nostrils at bedtime.  [name] ipratropium-albuterol (DUONEB) 0.5-2.5 (3) MG/3ML SOLN USE ONE VIAL IN NEBULIZER 4 TIMES DAILY (Patient taking differently: Inhale 3 mLs into the lungs every 6 (six) hours as needed. For shortness of breath)  . ketoconazole (NIZORAL) 2 % cream Apply 1 application topically 2 (two) times daily.  [name] losartan (COZAAR) 100 MG tablet TAKE ONE TABLET BY MOUTH ONCE DAILY ( DISCONTINUE BENICAR)  . magnesium oxide (MAG-OX) 400 MG tablet Take 400 mg by mouth daily.   . meclizine (ANTIVERT) 25 MG tablet 1/2-1 pill up to 3 times daily for motion sickness/dizziness  . metoprolol (LOPRESSOR) 50 MG tablet TAKE ONE TABLET BY MOUTH TWICE DAILY FOR BLOOD PRESSURE  . mometasone-formoterol (DULERA) 200-5 MCG/ACT AERO Inhale 2 puffs into the lungs 2 (two) times daily. (Patient taking differently: Inhale 2 puffs into the lungs 2 (two) times daily as needed for wheezing. )  . neomycin-polymyxin b-dexamethasone (MAXITROL) 3.5-10000-0.1 OINT Apply to stye 4 x / day  . nitroGLYCERIN (NITROSTAT) 0.4 MG SL tablet Place 1 tablet (0.4 mg total) under the tongue every 5 (five) minutes as needed for chest pain.  [name] ondansetron (ZOFRAN-ODT) 8 MG disintegrating tablet DISSOLVE ONE TABLET IN MOUTH EVERY 8 HOURS AS NEEDED FOR NAUSEA AND VOMITING  . OVER THE COUNTER MEDICATION Take 1,000 mg by mouth 2 (two) times daily. [place] Calcium 1000mg   . oxyCODONE (OXY IR/ROXICODONE) 5 MG immediate release tablet Take 1 tablet (5 mg total) by mouth every 4 (four) hours as needed for severe pain or breakthrough pain. (Patient taking differently: Take 5 mg by mouth every 6 (six) hours as needed for severe pain or breakthrough pain. )  . pantoprazole (PROTONIX) 40 MG tablet Take 1 tablet (40 mg total) by mouth daily.  . polyethylene glycol (MIRALAX / GLYCOLAX) packet Take 17 g by mouth 2 (two) times daily.  . potassium chloride SA (K-DUR,KLOR-CON) 20 MEQ tablet Take 1 tablet (20  mEq total) by mouth 2 (two) times daily.  . pravastatin (PRAVACHOL) 40 MG tablet TAKE ONE TABLET BY MOUTH ONCE DAILY  . promethazine (PHENERGAN) 25 MG tablet TAKE ONE TABLET BY MOUTH EVERY 6 HOURS AS NEEDED FOR NAUSEA OR VOMITING  . senna-docusate (SENOKOT-S) 8.6-50 MG tablet Take 2 tablets by mouth 2 (two) times daily.  . silodosin (RAPAFLO) 8 MG CAPS capsule Take 8 mg by mouth every other day.   . sucralfate (CARAFATE) 1 g tablet TAKE ONE TABLET BY MOUTH 4 TIMES DAILY BEFORE MEALS AND AT BEDTIME FOR HEARTBURN   Current  Facility-Administered Medications on File Prior to Visit  Medication  . promethazine (PHENERGAN) injection 25 mg    Problem list She has Essential hypertension; Asthma; ESOPHAGEAL STRICTURE; Incarcerated recurrent paraesophageal hiatal hernia s/p lap redo repair [id number]; Vertebral compression fracture ([place]); Herniated lumbar intervertebral disc; MGUS (monoclonal gammopathy of unknown significance); Constipation, chronic; Prediabetes; Vitamin D deficiency; Medication management; Osteoporosis; Coronary artery disease due to lipid rich plaque; Depression, major, in partial remission ([place]); Chronic pain syndrome; Mixed hyperlipidemia; GERD ; Encounter for Medicare annual wellness exam; DDD (degenerative disc disease), lumbar; Spinal stenosis, lumbar region, with neurogenic claudication; Ileus ([place]); Leukocytosis; History of lumbar spinal fusion; Spondylolisthesis, lumbar region; and Anxiety state on her problem list.   Review of Systems  Constitutional: Negative.  Negative for chills, fatigue and fever.  HENT: Negative for congestion, dental problem, drooling, ear discharge, ear pain, facial swelling, hearing loss, mouth sores, nosebleeds, postnasal drip, rhinorrhea, sinus pressure, sneezing, sore throat, tinnitus, trouble swallowing and voice change.   Eyes: Negative.  Negative for visual disturbance.  Respiratory: Negative.   Cardiovascular: Negative.  Negative for leg swelling.  Gastrointestinal: Positive for constipation and nausea. Negative for abdominal distention, abdominal pain, anal bleeding, blood in stool, diarrhea, rectal pain and vomiting.  Genitourinary: Positive for flank pain.  Musculoskeletal: Positive for back pain. Negative for neck pain and neck stiffness.  Skin: Negative for rash.  Neurological: Positive for dizziness. Negative for tremors, seizures, syncope, facial asymmetry, speech difficulty, weakness, light-headedness, numbness and headaches.  Psychiatric/Behavioral:  Negative.        Objective:   Physical Exam  Constitutional: She is oriented to person, place, and time. She appears well-developed and well-nourished.  Neck: Normal range of motion. Neck supple.  Cardiovascular: Normal rate and regular rhythm.   Pulmonary/Chest: Effort normal and breath sounds normal.  Abdominal: Soft. Bowel sounds are normal. She exhibits no distension and no mass. There is no tenderness (nontender). There is no rebound and no guarding.  Musculoskeletal: Normal range of motion. She exhibits tenderness (Right rib/back pain to palpation).  Neurological: She is alert and oriented to person, place, and time.  Skin: Skin is warm and dry. No rash (no rash) noted.       Assessment & Plan:  Chest pain- likely costochondritis + tenderness to palpations on chest, non exertional, with no accompaniments- continue  meds PRN follow up with Dr. Teressa Senter if not better for possible compression fracture Go to the ER if any CP, SOB, nausea, dizziness, severe HA, changes vision/speech Still with pain of right breast with possible mass per patient, not palpated when here an had more tenderness on chest wall then breast, will get diagnostic MGM

## 2016-11-16 ENCOUNTER — Telehealth (INDEPENDENT_AMBULATORY_CARE_PROVIDER_SITE_OTHER): Payer: Self-pay | Admitting: Physical Medicine and Rehabilitation

## 2016-11-16 NOTE — Telephone Encounter (Signed)
Did the injection help?

## 2016-11-17 NOTE — Telephone Encounter (Signed)
Patient said injection did not help even 50% and the relief only lased about a week. She sees Dr. Hardin Negus for pain management and I advised her to call that office to see what they would recommend until she sees Dr. Louanne Skye since the injection did not help much.

## 2016-11-21 ENCOUNTER — Telehealth: Payer: Self-pay

## 2016-11-21 ENCOUNTER — Telehealth: Payer: Self-pay | Admitting: *Deleted

## 2016-11-21 NOTE — Telephone Encounter (Signed)
Patient called and asked about the dose of Miralax she can take due to constipation.  Per Dr Melford Aase, she can the recommended dose every 2 to 3 hours until she has relief.  She also states she is having pai in her right breast tissue and about getting a mammogram to check for any problem.  Per Vicie Mutters, a mammogram can be ordered.

## 2016-11-21 NOTE — Telephone Encounter (Signed)
Can start tomorrow or the next time she is unable to have a BM

## 2016-11-21 NOTE — Telephone Encounter (Signed)
Pt informed of instructions on when to start miralax & dulcolax.

## 2016-11-21 NOTE — Addendum Note (Signed)
Addended by: Vicie Mutters R on: 11/21/2016 12:43 PM   Modules accepted: Orders

## 2016-11-21 NOTE — Telephone Encounter (Signed)
Pt reports having trouble with having a BM.  Per provider's after visit summary: pt should get an OTC miralax along with a box of dulcolax 5mg  tablets. Take 4 dulcolax. Wait 1 hour Then take 6-8 capfuls of miralax mixed with (H2o,juice/gatorade) over 2-3 hours. Pt should expect results within 1-6 hours after completing the bowel purge. Go to the ER if you ar having severe AB pain, can not pass gas or stool in over 12 hrs or hold down any food.  Pt was given these instructions & voiced understanding. I informed pt that I would also mail these instructions out to her home so she would have them on hand in the future.   Pt also reported having a really good BM today & would like to know if she should start taking the miralax & dulcolax today or should she start tomorrow.......... please advise?

## 2016-11-28 ENCOUNTER — Ambulatory Visit
Admission: RE | Admit: 2016-11-28 | Discharge: 2016-11-28 | Disposition: A | Discharge status: Home / Self Care | Payer: PPO | Source: Ambulatory Visit | Attending: Physician Assistant | Admitting: Physician Assistant

## 2016-11-28 ENCOUNTER — Ambulatory Visit: Payer: PPO

## 2016-11-28 DIAGNOSIS — N644 Mastodynia: Secondary | ICD-10-CM

## 2016-11-28 DIAGNOSIS — R928 Other abnormal and inconclusive findings on diagnostic imaging of breast: Secondary | ICD-10-CM | POA: Diagnosis not present

## 2016-11-29 ENCOUNTER — Other Ambulatory Visit: Payer: PPO

## 2016-11-30 ENCOUNTER — Telehealth: Payer: Self-pay

## 2016-11-30 NOTE — Telephone Encounter (Signed)
Pt reports on going breast/back pain & would like a injection for the pain.  I asked the pain did the first injection help with the pain & pt stated that the pain never went away.  Per provider pt needs to follow up with ortho because she may need back injection.  Pt states she will call her ortho to see if she can get in to she him & voiced understanding & hung up.

## 2016-12-01 ENCOUNTER — Telehealth (INDEPENDENT_AMBULATORY_CARE_PROVIDER_SITE_OTHER): Payer: Self-pay | Admitting: Specialist

## 2016-12-01 NOTE — Telephone Encounter (Signed)
Patient called advised she went to her PCP due to hurting under her breast to her back and she referred patient back to Dr Louanne Skye for an appointment. Patient asked if she can get in sooner than 12/09/16. The number to contact patient is [phone]

## 2016-12-02 NOTE — Telephone Encounter (Signed)
Pt has been advised that 12/09/16 is the soonest, that I would put her on the cancellation list

## 2016-12-06 ENCOUNTER — Ambulatory Visit (INDEPENDENT_AMBULATORY_CARE_PROVIDER_SITE_OTHER): Payer: PPO

## 2016-12-06 ENCOUNTER — Encounter (INDEPENDENT_AMBULATORY_CARE_PROVIDER_SITE_OTHER): Payer: Self-pay | Admitting: Specialist

## 2016-12-06 ENCOUNTER — Ambulatory Visit (INDEPENDENT_AMBULATORY_CARE_PROVIDER_SITE_OTHER): Payer: PPO | Admitting: Specialist

## 2016-12-06 VITALS — BP 121/69 | HR 73 | Ht 65.0 in | Wt 140.0 lb

## 2016-12-06 DIAGNOSIS — M546 Pain in thoracic spine: Secondary | ICD-10-CM

## 2016-12-06 DIAGNOSIS — R0781 Pleurodynia: Secondary | ICD-10-CM

## 2016-12-06 DIAGNOSIS — M8000XD Age-related osteoporosis with current pathological fracture, unspecified site, subsequent encounter for fracture with routine healing: Secondary | ICD-10-CM

## 2016-12-06 MED ORDER — DICLOFENAC SODIUM 1 % TD GEL: 4.0000 g | Freq: Four times a day (QID) | TRANSDERMAL | 3 refills | Status: DC

## 2016-12-06 NOTE — Progress Notes (Signed)
Office Visit Note   Patient: [name] [name]           Date of Birth: Dec 25, 1935           MRN: [id number] Visit Date: 12/06/2016              Requested by: [name], [address] [place], [place] [address] PCP: [name], MD   Assessment & Plan: Visit Diagnoses:  1. Pain in thoracic spine   2. Rib pain on right side   3. Age-related osteoporosis with current pathological fracture with routine healing, subsequent encounter     Plan: Avoid frequent bending and stooping  No lifting greater than 10 lbs. May use ice or moist heat for pain. Weight loss is of benefit. Handicap license is approved. MRI of the thoracic spine for assessment of vertebrae for possible compression fracture     Follow-Up Instructions: Return in about 3 years (around 12/07/2019).   Orders:  Orders Placed This Encounter  Procedures  . XR Ribs Unilateral Right  . XR Thoracic Spine 2 View  . MR Thoracic Spine w/o contrast   Meds ordered this encounter  Medications  . diclofenac sodium (VOLTAREN) 1 % GEL    Sig: Apply 4 g topically 4 (four) times daily.    Dispense:  5 Tube    Refill:  3      Procedures: No procedures performed   Clinical Data: No additional findings.   Subjective: Chief Complaint  Patient presents with  . Lower Back - Follow-up    Patient had RT L2-3 TF with Dr. Ernestina Patches on 10/27/2016     81 year old female with severe osteoporosis T score -2.8 in 07/2016. She has had multiple compression fractures in her lumbar spine and is status post upper lumbar fusion for foramenal stenosis within a lumbar scoliosis with severe disc degeneration. Had placement of cemented pedicle screws and rod at the L1 to L3 levels she has a compression deformity at the T12 level with auto fusion of T12-L1. The surgery 11/2015 she has had persisting pain and discomfort in her back mainly but more recently pain in the right upper thorax at the area just below the right  scapula at about the D7 or D8 level. She was seen at her primary care. Breast imaging studies were negative and she was referred back for evaluation of her thoracic and rib pain. Present meds include calcium and vitamin D supplements and she also takes fosamax for improving her bone strength. No bowel or bladder difficulty, she wears an LSO for back pain.     Review of Systems  Constitutional: Negative.   HENT: Negative.   Eyes: Negative.   Respiratory: Negative.   Cardiovascular: Negative.   Gastrointestinal: Negative.   Endocrine: Negative.   Genitourinary: Negative.   Musculoskeletal: Negative.   Skin: Negative.   Allergic/Immunologic: Negative.   Neurological: Negative.   Hematological: Negative.   Psychiatric/Behavioral: Negative.      Objective: Vital Signs: BP 121/69 (BP Location: Left Arm, Patient Position: Sitting)   Pulse 73   Ht 5\' 5"  (1.651 m)   Wt 140 lb (63.5 kg)   BMI 23.30 kg/m   Physical Exam  Constitutional: She is oriented to person, place, and time. She appears well-developed and well-nourished.  HENT:  Head: Normocephalic and atraumatic.  Eyes: Pupils are equal, round, and reactive to light. EOM are normal.  Neck: Normal range of motion. Neck supple.  Pulmonary/Chest: Effort normal and breath  sounds normal.  Abdominal: Soft. Bowel sounds are normal.  Neurological: She is alert and oriented to person, place, and time.  Skin: Skin is warm and dry.  Psychiatric: She has a normal mood and affect. Her behavior is normal. Judgment and thought content normal.    Back Exam   Range of Motion  Extension: abnormal  Flexion: abnormal  Lateral Bend Right: abnormal  Lateral Bend Left: abnormal  Rotation Right: abnormal  Rotation Left: abnormal   Muscle Strength  The patient has normal back strength.  Tests  Straight leg raise right: negative Straight leg raise left: negative  Reflexes  Patellar: 0/4 Babinski's sign: normal   Other  Toe Walk:  abnormal Heel Walk: abnormal Sensation: normal Erythema: no back redness Scars: present  Comments:  Pain with palpation of the right mid clavicular line D7-8.       Specialty Comments:  No specialty comments available.  Imaging: Xr Ribs Unilateral Right  Result Date: 12/06/2016 Right ribs with detainl demonstrate scar within the right lung field overlapping the right ribs at D7 and D8 Mid clavicular level. No definite fracture of the rib noted.  Xr Thoracic Spine 2 View  Result Date: 12/06/2016 AP and lateral radiographs including swimmer's view of the thoracic spine shows poor detail of the upper thoracic spine. Therea areas within the right lung field that overlap the ribs providing less detail of the Ribs and thoracic spine. No definite fracture or disclocation or subluxation of the dorsal spine noted.    PMFS History: Patient Active Problem List   Diagnosis Date Noted  . Herniated lumbar intervertebral disc 06/25/2012    Priority: High    Class: Acute  . Costochondritis 11/14/2016  . Anxiety state 06/08/2016  . History of lumbar spinal fusion 04/13/2016  . Spondylolisthesis, lumbar region 04/13/2016  . Leukocytosis 03/28/2016  . Ileus ([place])   . Spinal stenosis, lumbar region, with neurogenic claudication 05/29/2015  . DDD (degenerative disc disease), lumbar 04/21/2015  . Encounter for Medicare annual wellness exam 02/27/2015  . Mixed hyperlipidemia 11/24/2014  . GERD  11/24/2014  . Depression, major, in partial remission ([place]) 09/26/2014  . Chronic pain syndrome 09/26/2014  . Coronary artery disease due to lipid rich plaque   . Osteoporosis 02/10/2014  . Prediabetes 08/14/2013  . Vitamin D deficiency 08/14/2013  . Medication management 08/14/2013  . Constipation, chronic 04/03/2013  . MGUS (monoclonal gammopathy of unknown significance) 03/05/2013  . Vertebral compression fracture ([place]) 06/23/2012  . ESOPHAGEAL STRICTURE 08/28/2008  . Asthma 07/29/2008  .  Incarcerated recurrent paraesophageal hiatal hernia s/p lap redo repair [id number] 01/31/2008  . Essential hypertension 07/25/2007   Past Medical History:  Diagnosis Date  . Anxiety    takes Xanax daily as needed  . Asthma    Albuterol daily as needed  . CAD (coronary artery disease)    LHC 2003 with 40% pLAD, 30% mLAD, 100% D1 (moderate vessel), 100%  D2 (small vessel), 95% mid small OM2.  Pt had PTCA to D1;  D2 and OM2 small vessels and tx medically;  Last Myoview (2012): anterior infarct seen with scar, no ischemia. EF normal. Pt managed medically;  Lexiscan Myoview (12/14):  Low risk; ant scar with very mild peri-infarct ischemia; EF 55% with ant AK   . Chronic back pain    HNP, DDD, spinal stenosis, vertebral compression fractures.   . Chronic nausea    takes Zofran daily as needed.  normal gastric emptying study 03/2013  . Constipation  felt to be functional. takes Senokot and Miralax daily.  treated with Linzess in past.   . DIVERTICULOSIS, COLON 08/28/2008   Qualifier: Diagnosis of  By: [name], [name], [name]    . Elevated LFTs 2015   probably med induced DILI, from Augmentin vs Pravastatin  . GERD (gastroesophageal reflux disease)    hx of esophageal stricture   . Hiatal hernia    Paraesophageal hernias. s/p fundoplications in 4818 and 04/2013  . History of bronchitis several of yrs ago  . History of colon polyps 03/2009, 03/2011   adenomatous colon polyps, no high grade dysplasia.   [name] History of vertigo    no meds  . Hyperlipidemia    takes Pravastatin daily  . Hypertension    takes Amlodipine,Metoprolol,and Losartan daily  . Nausea 03/2016  . Osteoporosis   . PONV (postoperative nausea and vomiting)    yrs ago  . Slow urinary stream    takes Rapaflo daily  . TIA (transient ischemic attack) 1995   no residual effects noted    Family History  Problem Relation Age of Onset  . Colon cancer Mother 38  . Heart disease Father 12       heart attack    Past Surgical  History:  Procedure Laterality Date  . APPENDECTOMY     patient unsure of date  . BACK SURGERY    . BLADDER REPAIR    . CHOLECYSTECTOMY     Patient unsure of date.  . CORONARY ANGIOPLASTY  11/16/2001   1 stent  . ESOPHAGEAL MANOMETRY N/A 03/25/2013   Procedure: ESOPHAGEAL MANOMETRY (EM);  Surgeon: Inda Castle, MD;  Location: [facility];  Service: Endoscopy;  Laterality: N/A;  . ESOPHAGOGASTRODUODENOSCOPY N/A 03/15/2013   Procedure: ESOPHAGOGASTRODUODENOSCOPY (EGD);  Surgeon: Jerene Bears, MD;  Location: [facility] ENDOSCOPY;  Service: Endoscopy;  Laterality: N/A;  . ESOPHAGOGASTRODUODENOSCOPY N/A 12/25/2015   Procedure: ESOPHAGOGASTRODUODENOSCOPY (EGD);  Surgeon: Doran Stabler, MD;  Location[facility] ENDOSCOPY;  Service: Endoscopy;  Laterality: N/A;  . EYE SURGERY  11/2000   bilateral cataracts with lens implant  . FIXATION KYPHOPLASTY LUMBAR SPINE  X 2   "L3-4"  . HEMORRHOID SURGERY    . HIATAL HERNIA REPAIR  06/03/2011   Procedure: LAPAROSCOPIC REPAIR OF HIATAL HERNIA;  Surgeon: Adin Hector, MD;  Location: [facility] OR[facility];  Service: General;  Laterality: N/A;  . HIATAL HERNIA REPAIR N/A 04/24/2013   Procedure: LAPAROSCOPIC  REPAIR RECURRENT PARASOPHAGEAL HIATAL HERNIA WITH FUNDOPLICATION;  Surgeon: Adin Hector, MD;  Location: [facility];  Service: General;  Laterality: N/A;  . INSERTION OF MESH N/A 04/24/2013   Procedure: INSERTION OF MESH;  Surgeon: Adin Hector, MD;  Location: [facility];  Service: General;  Laterality: N/A;  . LAPAROSCOPIC LYSIS OF ADHESIONS N/A 04/24/2013   Procedure: LAPAROSCOPIC LYSIS OF ADHESIONS;  Surgeon: Adin Hector, MD;  Location: [facility];  Service: General;  Laterality: N/A;  . LAPAROSCOPIC NISSEN FUNDOPLICATION  5/63/1497   Procedure: LAPAROSCOPIC NISSEN FUNDOPLICATION;  Surgeon: Adin Hector, MD;  Location: [facility];  Service: General;  Laterality: N/A;  . LUMBAR FUSION  12/07/2015   Right L2-3 facetectomy, posterolateral fusion L2-3 with pedicle screws,  rods, local bone graft, Vivigen cancellous chips. Fusion extended to the L1 level with pedicle screws and rods  . LUMBAR LAMINECTOMY/DECOMPRESSION MICRODISCECTOMY Right 06/26/2012   Procedure: LUMBAR LAMINECTOMY/DECOMPRESSION MICRODISCECTOMY;  Surgeon: Jessy Oto, MD;  Location: [facility];  Service: Orthopedics;  Laterality: Right;  RIGHT L4-5 MICRODISCECTOMY  .  LUMBAR LAMINECTOMY/DECOMPRESSION MICRODISCECTOMY N/A 05/29/2015   Procedure: RIGHT L2-3 MICRODISCECTOMY;  Surgeon: Jessy Oto, MD;  Location: [facility];  Service: Orthopedics;  Laterality: N/A;  . TOTAL ABDOMINAL HYSTERECTOMY  1975   partial   Social History   Occupational History  . Retired Retired    worked at [employer]  . Smoking status: Never Smoker  . Smokeless tobacco: Never Used  . Alcohol use No  . Drug use: No  . Sexual activity: No

## 2016-12-06 NOTE — Patient Instructions (Signed)
Avoid frequent bending and stooping  No lifting greater than 10 lbs. May use ice or moist heat for pain. Weight loss is of benefit. Handicap license is approved.   

## 2016-12-07 DIAGNOSIS — G894 Chronic pain syndrome: Secondary | ICD-10-CM | POA: Diagnosis not present

## 2016-12-07 DIAGNOSIS — M4726 Other spondylosis with radiculopathy, lumbar region: Secondary | ICD-10-CM | POA: Diagnosis not present

## 2016-12-07 DIAGNOSIS — Z79891 Long term (current) use of opiate analgesic: Secondary | ICD-10-CM | POA: Diagnosis not present

## 2016-12-07 DIAGNOSIS — R079 Chest pain, unspecified: Secondary | ICD-10-CM | POA: Diagnosis not present

## 2016-12-09 ENCOUNTER — Ambulatory Visit (INDEPENDENT_AMBULATORY_CARE_PROVIDER_SITE_OTHER): Payer: PPO | Admitting: Specialist

## 2016-12-13 ENCOUNTER — Ambulatory Visit
Admission: RE | Admit: 2016-12-13 | Discharge: 2016-12-13 | Disposition: A | Discharge status: Home / Self Care | Payer: PPO | Source: Ambulatory Visit | Attending: Specialist | Admitting: Specialist

## 2016-12-13 DIAGNOSIS — M546 Pain in thoracic spine: Secondary | ICD-10-CM | POA: Diagnosis not present

## 2016-12-13 DIAGNOSIS — R0781 Pleurodynia: Secondary | ICD-10-CM

## 2016-12-14 ENCOUNTER — Other Ambulatory Visit (INDEPENDENT_AMBULATORY_CARE_PROVIDER_SITE_OTHER): Payer: Self-pay | Admitting: Specialist

## 2016-12-15 ENCOUNTER — Other Ambulatory Visit: Payer: Self-pay | Admitting: Rheumatology

## 2016-12-15 MED ORDER — ALENDRONATE SODIUM 70 MG PO TABS: 70.0000 mg | ORAL_TABLET | ORAL | 0 refills | Status: DC

## 2016-12-15 NOTE — Telephone Encounter (Signed)
Last Visit: 01/29/16 Next Visit: was due 07/2016. Message sent to front to schedule patient.  Labs: 05/17/16 WNL  Okay to refill 30 days of Fosamax ?

## 2016-12-15 NOTE — Telephone Encounter (Signed)
Ok. Labs are due now.

## 2016-12-15 NOTE — Telephone Encounter (Signed)
Gabapentin refill request 

## 2016-12-15 NOTE — Telephone Encounter (Signed)
Patient needs a refill on Fosamax. Patient uses [facility] in [facility]. Patient was last seen 01/2016.

## 2016-12-16 ENCOUNTER — Telehealth (INDEPENDENT_AMBULATORY_CARE_PROVIDER_SITE_OTHER): Payer: Self-pay | Admitting: Specialist

## 2016-12-16 NOTE — Telephone Encounter (Signed)
Patient called asked if she can get the back brace she spoke with Alyse Low about on her last visit. Patient thought she had one but it's to big. The number to contact patient is [phone]

## 2016-12-16 NOTE — Telephone Encounter (Signed)
I called and advised that she could come by and pick up the rib belt.  That I would leave it at the front desk for her to get.

## 2016-12-19 ENCOUNTER — Telehealth: Payer: Self-pay | Admitting: Rheumatology

## 2016-12-19 NOTE — Telephone Encounter (Signed)
Spoke with patient, and scheduled follow up appt. In December 2018

## 2016-12-19 NOTE — Telephone Encounter (Signed)
Yes she need a follow up appointment. Message was sent last week to schedule her a follow up appointment. She was sent in a 30 day supply.

## 2016-12-19 NOTE — Telephone Encounter (Signed)
Patient has not been here since 2017 for an appt. Patient would like a refill on her Fosamax. Does she need a return office visit first. Please call to advise patient.

## 2016-12-20 ENCOUNTER — Other Ambulatory Visit: Payer: PPO

## 2016-12-25 ENCOUNTER — Other Ambulatory Visit: Payer: Self-pay | Admitting: Internal Medicine

## 2017-01-04 ENCOUNTER — Ambulatory Visit (INDEPENDENT_AMBULATORY_CARE_PROVIDER_SITE_OTHER): Payer: PPO | Admitting: Internal Medicine

## 2017-01-04 ENCOUNTER — Encounter: Payer: Self-pay | Admitting: Internal Medicine

## 2017-01-04 VITALS — BP 128/68 | HR 72 | Temp 97.3°F | Resp 18 | Ht 65.0 in | Wt 150.6 lb

## 2017-01-04 DIAGNOSIS — E559 Vitamin D deficiency, unspecified: Secondary | ICD-10-CM

## 2017-01-04 DIAGNOSIS — I10 Essential (primary) hypertension: Secondary | ICD-10-CM | POA: Diagnosis not present

## 2017-01-04 DIAGNOSIS — E782 Mixed hyperlipidemia: Secondary | ICD-10-CM | POA: Diagnosis not present

## 2017-01-04 DIAGNOSIS — R7303 Prediabetes: Secondary | ICD-10-CM

## 2017-01-04 DIAGNOSIS — G894 Chronic pain syndrome: Secondary | ICD-10-CM

## 2017-01-04 DIAGNOSIS — K219 Gastro-esophageal reflux disease without esophagitis: Secondary | ICD-10-CM

## 2017-01-04 DIAGNOSIS — Z136 Encounter for screening for cardiovascular disorders: Secondary | ICD-10-CM | POA: Diagnosis not present

## 2017-01-04 DIAGNOSIS — M940 Chondrocostal junction syndrome [Tietze]: Secondary | ICD-10-CM | POA: Diagnosis not present

## 2017-01-04 DIAGNOSIS — Z79899 Other long term (current) drug therapy: Secondary | ICD-10-CM

## 2017-01-04 MED ORDER — PREDNISONE 10 MG PO TABS: ORAL_TABLET | ORAL | 0 refills | Status: AC

## 2017-01-04 MED ORDER — GABAPENTIN 300 MG PO CAPS: ORAL_CAPSULE | ORAL | 1 refills | Status: DC

## 2017-01-04 NOTE — Progress Notes (Signed)
This very nice 81 y.o. MWF presents for 6 month follow up with Hypertension, ASCAD, Hyperlipidemia, Pre-Diabetes and Vitamin D Deficiency. Patient is also followed by Dr Melvyn Novas for her COPD.      In Jan 2013 she had a Lap Nissen Fundoplication. And currently denies any GI Sx's.  She has ongoing c/o of a radicular T3-4 dermatomes, but actually is more point tenderness in the ribs to pressure - touch - palpation.  pain of the Rt chest along the Rt      Patient has Chronic Pain Syndrome managed by Dr Nicholaus Bloom. Patient has had lumbar surg in 1014 and 2 x in 2017 by Dr Louanne Skye. She also has undergone Kyphoplasty in 2014 for L3/L4 compression fx's.       Patient is treated for HTN (1986) & BP has been controlled at home. Today's BP is at goal -  128/68.  In 2003 , she presented ACS and had PCA/Stents and more recently in 2013 she had a negative Myoview. Patient has had no complaints of any cardiac type chest pain, palpitations, dyspnea/orthopnea/PND, dizziness, claudication, or dependent edema.     Hyperlipidemia is controlled with diet & meds. Patient denies myalgias or other med SE's. Last Lipids were at goal: Lab Results  Component Value Date   CHOL 131 09/30/2016   HDL 60 09/30/2016   LDLCALC 51 09/30/2016   TRIG 100 09/30/2016   CHOLHDL 2.2 09/30/2016      Also, the patient has history of PreDiabetes with A1c 6.1% in 2013 and with elevated glucoses in the hospital, she was started on Metformin in Nov 2016, she later self d/c'd this as outpatient at home.   and has had no symptoms of reactive hypoglycemia, diabetic polys, paresthesias or visual blurring.  She reports CBG's range betw 80-100 , since stopping Metformin. Last A1c was at goal:  Lab Results  Component Value Date   HGBA1C 5.6 09/30/2016      Further, the patient also has history of Vitamin D Deficiency and supplements vitamin D without any suspected side-effects. Last vitamin D was at goal:  Lab Results  Component Value Date    VD25OH 69 09/30/2016   Current Outpatient Prescriptions on File Prior to Visit  Medication Sig  . acetaminophen (TYLENOL) 325 MG tablet Take 2 tablets (650 mg total) by mouth every 6 (six) hours as needed for mild pain (or Fever >/= 101).  [name] albuterol (PROAIR HFA) 108 (90 Base) MCG/ACT inhaler Inhale 2 puffs into the lungs every 6 (six) hours as needed for wheezing or shortness of breath.  [name] alendronate (FOSAMAX) 70 MG tablet Take 1 tablet (70 mg total) by mouth once a week. Take with a full glass of water on an empty stomach.  . ALPRAZolam (XANAX) 0.5 MG tablet TAKE ONE-HALF TO ONE TABLET BY MOUTH TWICE DAILY TO THREE TIMES DAILY FOR  NEVERS  . amLODipine (NORVASC) 10 MG tablet Take 1 tablet (10 mg total) by mouth at bedtime.  . Cholecalciferol (VITAMIN D3) 2000 UNITS TABS Take 6,000 Units by mouth daily.   . diclofenac sodium (VOLTAREN) 1 % GEL Apply 4 g topically 4 (four) times daily.  . fluconazole (DIFLUCAN) 150 MG tablet Take 1 tablet (150 mg total) by mouth daily.  [name] gabapentin (NEURONTIN) 100 MG capsule TAKE ONE CAPSULE BY MOUTH THREE TIMES DAILY  . ipratropium-albuterol (DUONEB) 0.5-2.5 (3) MG/3ML SOLN USE ONE VIAL IN NEBULIZER 4 TIMES DAILY  . ketoconazole (NIZORAL) 2 % cream Apply  1 application topically 2 (two) times daily.  [name] losartan (COZAAR) 100 MG tablet TAKE ONE TABLET BY MOUTH ONCE DAILY ( DISCONTINUE BENICAR)  . magnesium oxide (MAG-OX) 400 MG tablet Take 400 mg by mouth daily.  . meclizine (ANTIVERT) 25 MG tablet 1/2-1 pill up to 3 times daily for motion sickness/dizziness  . metoprolol (LOPRESSOR) 50 MG tablet TAKE ONE TABLET BY MOUTH TWICE DAILY FOR BLOOD PRESSURE  . mometasone-formoterol (DULERA) 200-5 MCG/ACT AERO Inhale 2 puffs into the lungs 2 (two) times daily.  [name] neomycin-polymyxin b-dexamethasone (MAXITROL) 3.5-10000-0.1 OINT Apply to stye 4 x / day  . nitroGLYCERIN (NITROSTAT) 0.4 MG SL tablet Place 1 tablet (0.4 mg total) under the tongue every 5 (five) minutes  as needed for chest pain.  [name] ondansetron (ZOFRAN-ODT) 8 MG disintegrating tablet DISSOLVE ONE TABLET IN MOUTH EVERY 8 HOURS AS NEEDED FOR NAUSEA AND VOMITING  . OVER THE COUNTER MEDICATION Take 1,000 mg by mouth 2 (two) times daily. Coral Calcium 1000mg   . oxyCODONE (OXY IR/ROXICODONE) 5 MG immediate release tablet Take 1 tablet (5 mg total) by mouth every 4 (four) hours as needed for severe pain or breakthrough pain.  . pantoprazole (PROTONIX) 40 MG tablet Take 1 tablet (40 mg total) by mouth daily.  . polyethylene glycol (MIRALAX / GLYCOLAX) packet Take 17 g by mouth 2 (two) times daily.  . potassium chloride SA (K-DUR,KLOR-CON) 20 MEQ tablet Take 1 tablet (20 mEq total) by mouth 2 (two) times daily.  . pravastatin (PRAVACHOL) 40 MG tablet TAKE 1 TABLET BY MOUTH ONCE DAILY  . promethazine (PHENERGAN) 25 MG tablet TAKE ONE TABLET BY MOUTH EVERY 6 HOURS AS NEEDED FOR NAUSEA OR VOMITING  . senna-docusate (SENOKOT-S) 8.6-50 MG tablet Take 2 tablets by mouth 2 (two) times daily.  . silodosin (RAPAFLO) 8 MG CAPS capsule Take 8 mg by mouth every other day.   . sucralfate (CARAFATE) 1 g tablet TAKE ONE TABLET BY MOUTH 4 TIMES DAILY BEFORE MEALS AND AT BEDTIME FOR HEARTBURN   Current Facility-Administered Medications on File Prior to Visit  Medication  . promethazine (PHENERGAN) injection 25 mg   Allergies  Allergen Reactions  . Ace Inhibitors Cough    Per Dr Melvyn Novas pulmonology 2013  . Aspartame Diarrhea and Nausea And Vomiting  . Atorvastatin Other (See Comments)    Muscle aches  . Biaxin [Clarithromycin] Other (See Comments)    PATIENT CAN TOLERATE Z-PAK.  Is written on patient's paper chart.  [name] [Roflumilast] Nausea And Vomiting  . Erythromycin Other (See Comments)    PATIENT CAN TOLERATE Z-PAK.  It is written on patient's paper chart.  . Levofloxacin Nausea And Vomiting   PMHx:   Past Medical History:  Diagnosis Date  . Anxiety    takes Xanax daily as needed  . Asthma     Albuterol daily as needed  . CAD (coronary artery disease)    LHC 2003 with 40% pLAD, 30% mLAD, 100% D1 (moderate vessel), 100%  D2 (small vessel), 95% mid small OM2.  Pt had PTCA to D1;  D2 and OM2 small vessels and tx medically;  Last Myoview (2012): anterior infarct seen with scar, no ischemia. EF normal. Pt managed medically;  Lexiscan Myoview (12/14):  Low risk; ant scar with very mild peri-infarct ischemia; EF 55% with ant AK   . Chronic back pain    HNP, DDD, spinal stenosis, vertebral compression fractures.   . Chronic nausea    takes Zofran daily as needed.  normal gastric  emptying study 03/2013  . Constipation    felt to be functional. takes Senokot and Miralax daily.  treated with Linzess in past.   . DIVERTICULOSIS, COLON 08/28/2008   Qualifier: Diagnosis of  By: [name], [name], [name]    . Elevated LFTs 2015   probably med induced DILI, from Augmentin vs Pravastatin  . GERD (gastroesophageal reflux disease)    hx of esophageal stricture   . Hiatal hernia    Paraesophageal hernias. s/p fundoplications in 1610 and 04/2013  . History of bronchitis several of yrs ago  . History of colon polyps 03/2009, 03/2011   adenomatous colon polyps, no high grade dysplasia.   [name] History of vertigo    no meds  . Hyperlipidemia    takes Pravastatin daily  . Hypertension    takes Amlodipine,Metoprolol,and Losartan daily  . Nausea 03/2016  . Osteoporosis   . PONV (postoperative nausea and vomiting)    yrs ago  . Slow urinary stream    takes Rapaflo daily  . TIA (transient ischemic attack) 1995   no residual effects noted   Immunization History  Administered Date(s) Administered  . DT 05/06/2014  . Influenza Split 02/07/2011  . Influenza Whole 02/09/2009, 01/19/2010, 02/13/2012  . Influenza, High Dose Seasonal PF 03/22/2013, 01/15/2014, 02/27/2015, 01/14/2016  . Pneumococcal Conjugate-13 05/06/2014  . Pneumococcal Polysaccharide-23 02/09/2011  . Pneumococcal-Unspecified 02/06/2010,  02/09/2012  . Td 05/10/2003   Past Surgical History:  Procedure Laterality Date  . APPENDECTOMY     patient unsure of date  . BACK SURGERY    . BLADDER REPAIR    . CHOLECYSTECTOMY     Patient unsure of date.  . CORONARY ANGIOPLASTY  11/16/2001   1 stent  . ESOPHAGEAL MANOMETRY N/A 03/25/2013   Procedure: ESOPHAGEAL MANOMETRY (EM);  Surgeon: Inda Castle, MD;  Location: [facility];  Service: Endoscopy;  Laterality: N/A;  . ESOPHAGOGASTRODUODENOSCOPY N/A 03/15/2013   Procedure: ESOPHAGOGASTRODUODENOSCOPY (EGD);  Surgeon: Jerene Bears, MD;  Location: [facility] ENDOSCOPY;  Service: Endoscopy;  Laterality: N/A;  . ESOPHAGOGASTRODUODENOSCOPY N/A 12/25/2015   Procedure: ESOPHAGOGASTRODUODENOSCOPY (EGD);  Surgeon: Doran Stabler, MD;  Location: [facility] ENDOSCOPY;  Service: Endoscopy;  Laterality: N/A;  . EYE SURGERY  11/2000   bilateral cataracts with lens implant  . FIXATION KYPHOPLASTY LUMBAR SPINE  X 2   "L3-4"  . HEMORRHOID SURGERY    . HIATAL HERNIA REPAIR  06/03/2011   Procedure: LAPAROSCOPIC REPAIR OF HIATAL HERNIA;  Surgeon: Adin Hector, MD;  Location: [facility];  Service: General;  Laterality: N/A;  . HIATAL HERNIA REPAIR N/A 04/24/2013   Procedure: LAPAROSCOPIC  REPAIR RECURRENT PARASOPHAGEAL HIATAL HERNIA WITH FUNDOPLICATION;  Surgeon: Adin Hector, MD;  Location: [facility];  Service: General;  Laterality: N/A;  . INSERTION OF MESH N/A 04/24/2013   Procedure: INSERTION OF MESH;  Surgeon: Adin Hector, MD;  Location: [facility];  Service: General;  Laterality: N/A;  . LAPAROSCOPIC LYSIS OF ADHESIONS N/A 04/24/2013   Procedure: LAPAROSCOPIC LYSIS OF ADHESIONS;  Surgeon: Adin Hector, MD;  Location: [facility];  Service: General;  Laterality: N/A;  . LAPAROSCOPIC NISSEN FUNDOPLICATION  9/60/4540   Procedure: LAPAROSCOPIC NISSEN FUNDOPLICATION;  Surgeon: Adin Hector, MD;  Location: [facility];  Service: General;  Laterality: N/A;  . LUMBAR FUSION  12/07/2015   Right L2-3 facetectomy,  posterolateral fusion L2-3 with pedicle screws, rods, local bone graft, Vivigen cancellous chips. Fusion extended to the L1 level with pedicle screws and rods  . LUMBAR  LAMINECTOMY/DECOMPRESSION MICRODISCECTOMY Right 06/26/2012   Procedure: LUMBAR LAMINECTOMY/DECOMPRESSION MICRODISCECTOMY;  Surgeon: Jessy Oto, MD;  Location: [place];  Service: Orthopedics;  Laterality: Right;  RIGHT L4-5 MICRODISCECTOMY  . LUMBAR LAMINECTOMY/DECOMPRESSION MICRODISCECTOMY N/A 05/29/2015   Procedure: RIGHT L2-3 MICRODISCECTOMY;  Surgeon: Jessy Oto, MD;  Location: [place];  Service: Orthopedics;  Laterality: N/A;  . TOTAL ABDOMINAL HYSTERECTOMY  1975   partial   FHx:    Reviewed / unchanged  SHx:    Reviewed / unchanged  Systems Review:  Constitutional: Denies fever, chills, wt changes, headaches, insomnia, fatigue, night sweats, change in appetite. Eyes: Denies redness, blurred vision, diplopia, discharge, itchy, watery eyes.  ENT: Denies discharge, congestion, post nasal drip, epistaxis, sore throat, earache, hearing loss, dental pain, tinnitus, vertigo, sinus pain, snoring.  CV: Denies chest pain, palpitations, irregular heartbeat, syncope, dyspnea, diaphoresis, orthopnea, PND, claudication or edema. Respiratory: denies cough, dyspnea, DOE, pleurisy, hoarseness, laryngitis, wheezing.  Gastrointestinal: Denies dysphagia, odynophagia, heartburn, reflux, water brash, abdominal pain or cramps, nausea, vomiting, bloating, diarrhea, constipation, hematemesis, melena, hematochezia  or hemorrhoids. Genitourinary: Denies dysuria, frequency, urgency, nocturia, hesitancy, discharge, hematuria or flank pain. Musculoskeletal: Denies arthralgias, myalgias, stiffness, jt. swelling, pain, limping or strain/sprain.  Skin: Denies pruritus, rash, hives, warts, acne, eczema or change in skin lesion(s). Neuro: No weakness, tremor, incoordination, spasms, paresthesia or pain. Psychiatric: Denies confusion, memory loss or sensory  loss. Endo: Denies change in weight, skin or hair change.  Heme/Lymph: No excessive bleeding, bruising or enlarged lymph nodes.  Physical Exam  BP 128/68   Pulse 72   Temp (!) 97.3 F (36.3 C)   Resp 18   Ht 5\' 5"  (1.651 m)   Wt 150 lb 9.6 oz (68.3 kg)   BMI 25.06 kg/m   Appears well nourished, well groomed  and in no distress.  Eyes: PERRLA, EOMs, conjunctiva no swelling or erythema. Sinuses: No frontal/maxillary tenderness ENT/Mouth: EAC's clear, TM's nl w/o erythema, bulging. Nares clear w/o erythema, swelling, exudates. Oropharynx clear without erythema or exudates. Oral hygiene is good. Tongue normal, non obstructing. Hearing intact.  Neck: Supple. Thyroid nl. Car 2+/2+ without bruits, nodes or JVD. Chest: Respirations nl with BS clear & equal w/o rales, rhonchi, wheezing or stridor. Exquisite tenderness along the Rt 4th-5th rib(s)  Cor: Heart sounds normal w/ regular rate and rhythm without sig. murmurs, gallops, clicks or rubs. Peripheral pulses normal and equal  without edema.  Abdomen: Soft & bowel sounds normal. Non-tender w/o guarding, rebound, hernias, masses or organomegaly.  Lymphatics: Unremarkable.  Musculoskeletal: Full ROM all peripheral extremities, joint stability, 5/5 strength and normal gait.  Skin: Warm, dry without exposed rashes, lesions or ecchymosis apparent.  Neuro: Cranial nerves intact, reflexes equal bilaterally. Sensory-motor testing grossly intact. Tendon reflexes grossly intact.  Pysch: Alert & oriented x 3.  Insight and judgement nl & appropriate. No ideations.  Assessment and Plan:  1. Essential hypertension  - Continue medication, monitor blood pressure at home.  - Continue DASH diet. Reminder to go to the ER if any CP,   SOB, nausea, dizziness, severe HA, changes vision/speech.  - CBC with Differential/Platelet - BASIC METABOLIC PANEL WITH GFR - Magnesium - TSH  2. Hyperlipidemia, mixed  - Continue diet/meds, exercise,& lifestyle  modifications.  - Continue monitor periodic cholesterol/liver & renal functions   - Hepatic function panel - Lipid panel - TSH  3. Prediabetes  - Hemoglobin A1c - Insulin, fasting  4. Vitamin D deficiency  - Continue diet, exercise, lifestyle modifications.  -  Monitor appropriate labs. - Continue supplementation. - VITAMIN D 25 Hydroxy   5. Gastroesophageal reflux disease, controlled.   6. Medication management  - CBC with Differential/Platelet - BASIC METABOLIC PANEL WITH GFR - Hepatic function panel - Magnesium - Lipid panel - TSH - Hemoglobin A1c - Insulin, fasting - VITAMIN D 25 Hydroxy  7. Costochondritis, acute  - predniSONE (DELTASONE) 10 MG tablet; 1 tab 3 x day for 3 days, then 1 tab 2 x day for 3 days, then 1 tab 1 x day for 5 days  Dispense: 20 tablet; Refill: 0  8. Chronic pain syndrome  - Recc increase dose Gabapentin (NEURONTIN) 300 MG capsule; Take 1 capsule 3 x / day for chronic pain  Dispense: 270 capsule; Refill: 1      Discussed  regular exercise, BP monitoring, weight control to achieve/maintain BMI less than 25 and discussed med and SE's. Recommended labs to assess and monitor clinical status with further disposition pending results of labs. Over 30 minutes of exam, counseling, chart review was performed.

## 2017-01-04 NOTE — Patient Instructions (Signed)

## 2017-01-05 LAB — CBC WITH DIFFERENTIAL/PLATELET
Basophils Absolute: 71 cells/uL (ref 0–200)
Basophils Relative: 1 %
EOS PCT: 3.5 %
Eosinophils Absolute: 249 cells/uL (ref 15–500)
HCT: 41.2 % (ref 35.0–45.0)
Hemoglobin: 13.9 g/dL (ref 11.7–15.5)
Lymphs Abs: 2229 cells/uL (ref 850–3900)
MCH: 31.7 pg (ref 27.0–33.0)
MCHC: 33.7 g/dL (ref 32.0–36.0)
MCV: 93.8 fL (ref 80.0–100.0)
MONOS PCT: 8.3 %
MPV: 11.6 fL (ref 7.5–12.5)
NEUTROS PCT: 55.8 %
Neutro Abs: 3962 cells/uL (ref 1500–7800)
PLATELETS: 213 10*3/uL (ref 140–400)
RBC: 4.39 10*6/uL (ref 3.80–5.10)
RDW: 12.7 % (ref 11.0–15.0)
TOTAL LYMPHOCYTE: 31.4 %
WBC mixed population: 589 cells/uL (ref 200–950)
WBC: 7.1 10*3/uL (ref 3.8–10.8)

## 2017-01-05 LAB — HEMOGLOBIN A1C
HEMOGLOBIN A1C: 5.6 %{Hb} (ref <5.7)
Mean Plasma Glucose: 114 (calc)
eAG (mmol/L): 6.3 (calc)

## 2017-01-05 LAB — HEPATIC FUNCTION PANEL
AG Ratio: 1.6 (calc) (ref 1.0–2.5)
ALBUMIN MSPROF: 3.9 g/dL (ref 3.6–5.1)
ALT: 17 U/L (ref 6–29)
AST: 19 U/L (ref 10–35)
Alkaline phosphatase (APISO): 51 U/L (ref 33–130)
BILIRUBIN DIRECT: 0.2 mg/dL (ref 0.0–0.2)
BILIRUBIN INDIRECT: 0.6 mg/dL (ref 0.2–1.2)
GLOBULIN: 2.4 g/dL (ref 1.9–3.7)
TOTAL PROTEIN: 6.3 g/dL (ref 6.1–8.1)
Total Bilirubin: 0.8 mg/dL (ref 0.2–1.2)

## 2017-01-05 LAB — LIPID PANEL
CHOL/HDL RATIO: 1.8 (calc) (ref <5.0)
CHOLESTEROL: 124 mg/dL (ref <200)
HDL: 68 mg/dL (ref >50)
LDL CHOLESTEROL (CALC): 34 mg/dL
Non-HDL Cholesterol (Calc): 56 mg/dL (calc) (ref <130)
TRIGLYCERIDES: 135 mg/dL (ref <150)

## 2017-01-05 LAB — BASIC METABOLIC PANEL WITH GFR
BUN: 11 mg/dL (ref 7–25)
CALCIUM: 8.7 mg/dL (ref 8.6–10.4)
CHLORIDE: 105 mmol/L (ref 98–110)
CO2: 19 mmol/L — ABNORMAL LOW (ref 20–32)
CREATININE: 0.92 mg/dL (ref 0.60–0.93)
GFR, Est African American: 69 mL/min/{1.73_m2} (ref >=60)
GFR, Est Non African American: 59 mL/min/{1.73_m2} — ABNORMAL LOW (ref >=60)
GLUCOSE: 126 mg/dL — AB (ref 65–99)
Potassium: 4.2 mmol/L (ref 3.5–5.3)
Sodium: 138 mmol/L (ref 135–146)

## 2017-01-05 LAB — MAGNESIUM: Magnesium: 1.8 mg/dL (ref 1.5–2.5)

## 2017-01-05 LAB — TSH: TSH: 0.78 m[IU]/L (ref 0.40–4.50)

## 2017-01-05 LAB — VITAMIN D 25 HYDROXY (VIT D DEFICIENCY, FRACTURES): Vit D, 25-Hydroxy: 55 ng/mL (ref 30–100)

## 2017-01-05 LAB — INSULIN, FASTING: INSULIN: 73.7 u[IU]/mL — AB (ref 2.0–19.6)

## 2017-01-06 DIAGNOSIS — R079 Chest pain, unspecified: Secondary | ICD-10-CM | POA: Diagnosis not present

## 2017-01-06 DIAGNOSIS — M4726 Other spondylosis with radiculopathy, lumbar region: Secondary | ICD-10-CM | POA: Diagnosis not present

## 2017-01-06 DIAGNOSIS — Z79891 Long term (current) use of opiate analgesic: Secondary | ICD-10-CM | POA: Diagnosis not present

## 2017-01-06 DIAGNOSIS — G894 Chronic pain syndrome: Secondary | ICD-10-CM | POA: Diagnosis not present

## 2017-01-16 ENCOUNTER — Other Ambulatory Visit: Payer: Self-pay | Admitting: Rheumatology

## 2017-01-16 NOTE — Telephone Encounter (Signed)
Last Visit: 01/29/16 Next Visit: 04/11/17.  Labs: 01/04/17   Okay to refill per Dr. Estanislado Pandy

## 2017-01-18 ENCOUNTER — Encounter: Payer: Self-pay | Admitting: Internal Medicine

## 2017-01-18 ENCOUNTER — Ambulatory Visit (INDEPENDENT_AMBULATORY_CARE_PROVIDER_SITE_OTHER): Payer: PPO | Admitting: Internal Medicine

## 2017-01-18 VITALS — BP 124/72 | HR 60 | Temp 97.3°F | Resp 18 | Ht 63.0 in | Wt 155.8 lb

## 2017-01-18 DIAGNOSIS — M5414 Radiculopathy, thoracic region: Secondary | ICD-10-CM | POA: Diagnosis not present

## 2017-01-18 DIAGNOSIS — Z23 Encounter for immunization: Secondary | ICD-10-CM | POA: Diagnosis not present

## 2017-01-18 MED ORDER — GABAPENTIN 600 MG PO TABS: ORAL_TABLET | ORAL | 1 refills | Status: DC

## 2017-01-18 NOTE — Progress Notes (Signed)
Subjective:    Patient ID: [name] [name], female    DOB: March 12, 1936, 81 y.o.   MRN: [id number]  HPI   This delightful 81 yo MWF with HTN, HLD, ASHD/stents, and DDD/DJD returns for f/u after a prednisone pulse / taper for a Rt chest wall pain in a T5/T6 dermatone distribution with tenderness along as the Rt 4-6th ribs circumferentially. Denies dyspnea, fever, chills, sweats or rashes.  No respiratory sx's as cough or congestion. Patient has a chronic pain syndrome and is followed by Dr Nicholaus Bloom and Dr Louanne Skye.  Medication Sig  . acetaminophen (TYLENOL) 325 MG tablet Take 2 tablets (650 mg total) by mouth every 6 (six) hours as needed for mild pain (or Fever >/= 101).  [name] albuterol (PROAIR HFA) 108 (90 Base) MCG/ACT inhaler Inhale 2 puffs into the lungs every 6 (six) hours as needed for wheezing or shortness of breath.  [name] alendronate (FOSAMAX) 70 MG tablet TAKE 1 TABLET BY MOUTH ONCE A WEEK. TAKE WITH A FULL GLASS OF WATER ON AN EMPTY STOMACH.  [name] ALPRAZolam (XANAX) 0.5 MG tablet TAKE ONE-HALF TO ONE TABLET BY MOUTH TWICE DAILY TO THREE TIMES DAILY FOR  NEVERS  . amLODipine (NORVASC) 10 MG tablet Take 1 tablet (10 mg total) by mouth at bedtime.  . Cholecalciferol (VITAMIN D3) 2000 UNITS TABS Take 6,000 Units by mouth daily.   . diclofenac sodium (VOLTAREN) 1 % GEL Apply 4 g topically 4 (four) times daily.  [name] ipratropium-albuterol (DUONEB) 0.5-2.5 (3) MG/3ML SOLN USE ONE VIAL IN NEBULIZER 4 TIMES DAILY  . losartan (COZAAR) 100 MG tablet TAKE ONE TABLET BY MOUTH ONCE DAILY ( DISCONTINUE BENICAR)  . magnesium oxide (MAG-OX) 400 MG tablet Take 400 mg by mouth 2 (two) times daily.   . meclizine (ANTIVERT) 25 MG tablet 1/2-1 pill up to 3 times daily for motion sickness/dizziness  . metoprolol (LOPRESSOR) 50 MG tablet TAKE ONE TABLET BY MOUTH TWICE DAILY FOR BLOOD PRESSURE  . mometasone-formoterol (DULERA) 200-5 MCG/ACT AERO Inhale 2 puffs into the lungs 2 (two) times daily.  . nitroGLYCERIN  (NITROSTAT) 0.4 MG SL tablet Place 1 tablet (0.4 mg total) under the tongue every 5 (five) minutes as needed for chest pain.  [name] ondansetron (ZOFRAN-ODT) 8 MG disintegrating tablet DISSOLVE ONE TABLET IN MOUTH EVERY 8 HOURS AS NEEDED FOR NAUSEA AND VOMITING  . OVER THE COUNTER MEDICATION Take 1,000 mg by mouth 2 (two) times daily. Coral Calcium 1000mg   . oxyCODONE (OXY IR/ROXICODONE) 5 MG immediate release tablet Take 1 tablet (5 mg total) by mouth every 4 (four) hours as needed for severe pain or breakthrough pain.  . pantoprazole (PROTONIX) 40 MG tablet Take 1 tablet (40 mg total) by mouth daily.  . polyethylene glycol (MIRALAX / GLYCOLAX) packet Take 17 g by mouth 2 (two) times daily.  . pravastatin (PRAVACHOL) 40 MG tablet TAKE 1 TABLET BY MOUTH ONCE DAILY  . promethazine (PHENERGAN) 25 MG tablet TAKE ONE TABLET BY MOUTH EVERY 6 HOURS AS NEEDED FOR NAUSEA OR VOMITING  . senna-docusate (SENOKOT-S) 8.6-50 MG tablet Take 2 tablets by mouth 2 (two) times daily.  . silodosin (RAPAFLO) 8 MG CAPS capsule Take 8 mg by mouth every other day.   . sucralfate (CARAFATE) 1 g tablet TAKE ONE TABLET BY MOUTH 4 TIMES DAILY BEFORE MEALS AND AT BEDTIME FOR HEARTBURN  . gabapentin (NEURONTIN) 300 MG capsule Take 1 capsule 3 x / day for chronic pain  . potassium chloride SA (K-DUR,KLOR-CON) 20 MEQ tablet  Take 1 tablet (20 mEq total) by mouth 2 (two) times daily. (Patient not taking: Reported on 01/18/2017)   No facility-administered medications prior to visit.    Allergies  Allergen Reactions  . Ace Inhibitors Cough    Per Dr Melvyn Novas pulmonology 2013  . Aspartame Diarrhea and Nausea And Vomiting  . Atorvastatin Other (See Comments)    Muscle aches  . Biaxin [Clarithromycin] Other (See Comments)    PATIENT CAN TOLERATE Z-PAK.  Is written on patient's paper chart.  [name] [Roflumilast] Nausea And Vomiting  . Erythromycin Other (See Comments)    PATIENT CAN TOLERATE Z-PAK.  It is written on patient's paper  chart.  . Levofloxacin Nausea And Vomiting   Review of Systems 10 point systems review negative except as above.    Objective:   Physical Exam  BP 124/72   Pulse 60   Temp (!) 97.3 F (36.3 C)   Resp 18   Ht 5\' 3"  (1.6 m)   Wt 155 lb 12.8 oz (70.7 kg)   BMI 27.60 kg/m   HEENT - WNL. Neck - supple.  Chest - Clear equal BS. Cor - Nl HS. RRR w/o sig MGR. PP 1(+). No edema. MS- FROM w/o deformities.  Gait Nl. Neuro -  Nl w/o focal abnormalities.    Assessment & Plan:   1. Thoracic radiculitis  - patient has upcoming with Dr Louanne Skye and she'd encouraged to discuss with him consideration of an EDSI or RFA neurolysis  - Increase Gabapentin dosing  2. Need for vaccination for H flu type B  - Flu vaccine HIGH DOSE PF

## 2017-01-18 NOTE — Patient Instructions (Addendum)
Intercostal Nerve Block   Description: The twelve intercostal nerves arise from the first thru twelfth thoracic nerve roots.  The nerve begins at the spine and wraps around the body, lying in a groove underneath each rib.  Each intercostal nerve innervates a specific strip of skin and body walk of the abdomen and chest.  Therefore, injuries of the chest wall or abdominal wall result in pain that is transmitted back to the brian via the intercostal nerves.  Examples of such injuries include rib fractures and incisions for lung and gall bladder surgery.  Occasionally, pain may persist long after an injury or surgical incision secondary to inflammation and irritation of the intercostal nerve.  The longstanding pain is known as intercostal neuralgia.  An intercostal nerve block is preformed to eliminate pain either temporarily or permanently.  A small needle is placed below the rib and local anesthetic (like Novocaine) and possibly steroid is injected.  Usually 2-4 intercostal nerves are blocked at a time depending on the problem.  The patient will experience a slight "pin-prick" sensation for each injection.  Shortly thereafter, the strip of skin that is innervated by the blocked intercostal nerve will feel numb.  Persistent pain that is only temporarily relieved with local anesthetic may require a more permanent block. This procedure is called Cryoneurolysis and entails placing a small probe beneath the rib to freeze the nerve.  Conditions that may be treated by intercostal nerve blocks:   Rib fractures  Longstanding pain from surgery of the chest or abdomen (intercostal neuralgia)  Pain from chest tubes  Pain from trauma to the chest  Preparation for the injections:  1. Do not eat any solid food or dairy products within 8 hours of your appointment. 2. You may drink clear liquids up to 3 hours before appointment.  Clear liquids include water, black coffee, juice or soda.  No milk or cream  please. 3. You may take your regular medication, including pain medications, with a sip of water before your appointment.  Diabetics should hold regular insulin (if take separately) and take 1/2 normal NPH dose the morning of the procedure.   Carry some sugar containing items with you to your appointment. 4. A driver must accompany you and be prepared to drive you home after your procedure. 5. Bring all your current medications with you. 6. An IV may be inserted and sedation may be given at the discretion of the physician. 7. A blood pressure cuff, EKG and other monitors will often be applied during the procedure.  Some patients may need to have extra oxygen administered for a short period. 8. You will be asked to provide medical information, including your allergies, prior to the procedure.  We must know immediately if you are taking blood thinners (like Coumadin/Warfarin) or if you are allergic to IV iodine contrast (dye). We must know if you could possible be pregnant.  Possible side-effects:   Bleeding from needle site  Infection (rare)  Nerve injury (rare)  Numbness & tingling of skin  Collapsed lung requiring chest tube (rare)  Local anesthetic toxicity (rare)  Light-headedness (temporary)  Pain at injection site (several days)  Decreased blood pressure (temporary)  Shortness of breath  Jittery/shaking sensation (temporary)  Call if you experience:   Difficulty breathing or hives (go directly to the emergency room)  Redness, inflammation or drainage at the injection site  Severe pain at the site of the injection  Any new symptoms which are concerning   Please note:  Your pain may subside immediately but may return several hours after the injection.  Often, more than one injection is required to reduce the pain. Also, if several temporary blocks with local anesthetic are ineffective, a more permanent block with cryolysis may be necessary.  This will be discussed  with you should this be the case.

## 2017-01-19 ENCOUNTER — Other Ambulatory Visit: Payer: Self-pay | Admitting: Physician Assistant

## 2017-01-19 NOTE — Telephone Encounter (Signed)
Please call Alpraz  

## 2017-01-23 DIAGNOSIS — R3912 Poor urinary stream: Secondary | ICD-10-CM | POA: Diagnosis not present

## 2017-01-23 DIAGNOSIS — R351 Nocturia: Secondary | ICD-10-CM | POA: Diagnosis not present

## 2017-02-03 DIAGNOSIS — R079 Chest pain, unspecified: Secondary | ICD-10-CM | POA: Diagnosis not present

## 2017-02-03 DIAGNOSIS — G894 Chronic pain syndrome: Secondary | ICD-10-CM | POA: Diagnosis not present

## 2017-02-03 DIAGNOSIS — M4726 Other spondylosis with radiculopathy, lumbar region: Secondary | ICD-10-CM | POA: Diagnosis not present

## 2017-02-03 DIAGNOSIS — Z79891 Long term (current) use of opiate analgesic: Secondary | ICD-10-CM | POA: Diagnosis not present

## 2017-02-06 ENCOUNTER — Encounter (INDEPENDENT_AMBULATORY_CARE_PROVIDER_SITE_OTHER): Payer: Self-pay | Admitting: Specialist

## 2017-02-06 ENCOUNTER — Ambulatory Visit (INDEPENDENT_AMBULATORY_CARE_PROVIDER_SITE_OTHER): Payer: PPO | Admitting: Specialist

## 2017-02-06 VITALS — BP 127/71 | HR 80 | Ht 65.0 in | Wt 155.0 lb

## 2017-02-06 DIAGNOSIS — M4015 Other secondary kyphosis, thoracolumbar region: Secondary | ICD-10-CM | POA: Diagnosis not present

## 2017-02-06 DIAGNOSIS — M4726 Other spondylosis with radiculopathy, lumbar region: Secondary | ICD-10-CM

## 2017-02-06 NOTE — Addendum Note (Signed)
Addended by: Meyer Cory on: 02/06/2017 05:31 PM   Modules accepted: Orders

## 2017-02-06 NOTE — Progress Notes (Signed)
Office Visit Note   Patient: [name] [name]           Date of Birth: [birth year]           MRN: [id number] Visit Date: 02/06/2017              Requested by: [name], [address] [place], [place] [address] PCP: [name], MD   Assessment & Plan: Visit Diagnoses:  1. Other spondylosis with radiculopathy, lumbar region   2. Other secondary kyphosis, thoracolumbar region     Plan: Avoid frequent bending and stooping  No lifting greater than 10 lbs. May use ice or moist heat for pain. Weight loss is of benefit. Handicap license is approved.    Follow-Up Instructions: Return in about 6 weeks (around 03/20/2017).   Orders:  No orders of the defined types were placed in this encounter.  No orders of the defined types were placed in this encounter.     Procedures: No procedures performed   Clinical Data: No additional findings.   Subjective: Chief Complaint  Patient presents with  . Middle Back - Follow-up    MRI Review--Thoracic Spine    81 year old female with persisting pain in the mid and lower lumbar spine bilateral, S/P T12 to L3 fusion. Pain is bilateral and increases with standing and walking. Seeing Dr. Hardin Negus in Pain Management. MRI of the thoracic spine with a minimal protrusion T11-12, non compressive, no foramenal stenosis. CT with severe arthrosis bilateral facets L3-4, L4-5 and L5-S1.     Review of Systems  Constitutional: Negative.   HENT: Negative.   Eyes: Negative.   Respiratory: Negative.   Cardiovascular: Negative.   Gastrointestinal: Negative.   Endocrine: Negative.   Genitourinary: Negative.   Musculoskeletal: Negative.   Skin: Negative.   Allergic/Immunologic: Negative.   Neurological: Negative.   Hematological: Negative.   Psychiatric/Behavioral: Negative.      Objective: Vital Signs: BP 127/71 (BP Location: Left Arm, Patient Position: Sitting)   Pulse 80   Ht 5\' 5"  (1.651 m)   Wt 155 lb  (70.3 kg)   BMI 25.79 kg/m   Physical Exam  Constitutional: She is oriented to person, place, and time. She appears well-developed and well-nourished.  HENT:  Head: Normocephalic and atraumatic.  Eyes: Pupils are equal, round, and reactive to light. EOM are normal.  Neck: Normal range of motion. Neck supple.  Pulmonary/Chest: Effort normal and breath sounds normal.  Abdominal: Soft. Bowel sounds are normal.  Neurological: She is alert and oriented to person, place, and time.  Skin: Skin is warm and dry.  Psychiatric: She has a normal mood and affect. Her behavior is normal. Judgment and thought content normal.    Back Exam   Tenderness  The patient is experiencing tenderness in the lumbar.  Range of Motion  Extension: abnormal  Flexion: abnormal  Lateral Bend Right: abnormal  Lateral Bend Left: abnormal  Rotation Right: abnormal  Rotation Left: abnormal   Muscle Strength  Right Quadriceps:  5/5  Left Quadriceps:  5/5  Right Hamstrings:  5/5  Left Hamstrings:  5/5   Tests  Straight leg raise right: negative Straight leg raise left: negative  Reflexes  Patellar: normal Achilles: normal Babinski's sign: normal   Other  Toe Walk: normal Heel Walk: normal Sensation: normal Gait: normal  Erythema: no back redness Scars: absent      Specialty Comments:  No specialty comments available.  Imaging: No results found.   [place]  History: Patient Active Problem List   Diagnosis Date Noted  . Herniated lumbar intervertebral disc 06/25/2012    Priority: High    Class: Acute  . Costochondritis 11/14/2016  . Anxiety state 06/08/2016  . History of lumbar spinal fusion 04/13/2016  . Spondylolisthesis, lumbar region 04/13/2016  . Leukocytosis 03/28/2016  . Ileus ([place])   . Spinal stenosis, lumbar region, with neurogenic claudication 05/29/2015  . DDD (degenerative disc disease), lumbar 04/21/2015  . Encounter for Medicare annual wellness exam 02/27/2015  . Mixed  hyperlipidemia 11/24/2014  . GERD  11/24/2014  . Depression, major, in partial remission ([place]) 09/26/2014  . Chronic pain syndrome 09/26/2014  . Coronary artery disease due to lipid rich plaque   . Osteoporosis 02/10/2014  . Prediabetes 08/14/2013  . Vitamin D deficiency 08/14/2013  . Medication management 08/14/2013  . Constipation, chronic 04/03/2013  . MGUS (monoclonal gammopathy of unknown significance) 03/05/2013  . Vertebral compression fracture ([place]) 06/23/2012  . ESOPHAGEAL STRICTURE 08/28/2008  . Asthma 07/29/2008  . Incarcerated recurrent paraesophageal hiatal hernia s/p lap redo repair [id number] 01/31/2008  . Essential hypertension 07/25/2007   Past Medical History:  Diagnosis Date  . Anxiety    takes Xanax daily as needed  . Asthma    Albuterol daily as needed  . CAD (coronary artery disease)    LHC 2003 with 40% pLAD, 30% mLAD, 100% D1 (moderate vessel), 100%  D2 (small vessel), 95% mid small OM2.  Pt had PTCA to D1;  D2 and OM2 small vessels and tx medically;  Last Myoview (2012): anterior infarct seen with scar, no ischemia. EF normal. Pt managed medically;  Lexiscan Myoview (12/14):  Low risk; ant scar with very mild peri-infarct ischemia; EF 55% with ant AK   . Chronic back pain    HNP, DDD, spinal stenosis, vertebral compression fractures.   . Chronic nausea    takes Zofran daily as needed.  normal gastric emptying study 03/2013  . Constipation    felt to be functional. takes Senokot and Miralax daily.  treated with Linzess in past.   . DIVERTICULOSIS, COLON 08/28/2008   Qualifier: Diagnosis of  By: [name], [name], [name]    . Elevated LFTs 2015   probably med induced DILI, from Augmentin vs Pravastatin  . GERD (gastroesophageal reflux disease)    hx of esophageal stricture   . Hiatal hernia    Paraesophageal hernias. s/p fundoplications in 1478 and 04/2013  . History of bronchitis several of yrs ago  . History of colon polyps 03/2009, 03/2011   adenomatous colon  polyps, no high grade dysplasia.   [name] History of vertigo    no meds  . Hyperlipidemia    takes Pravastatin daily  . Hypertension    takes Amlodipine,Metoprolol,and Losartan daily  . Nausea 03/2016  . Osteoporosis   . PONV (postoperative nausea and vomiting)    yrs ago  . Slow urinary stream    takes Rapaflo daily  . TIA (transient ischemic attack) 1995   no residual effects noted    Family History  Problem Relation Age of Onset  . Colon cancer Mother 74  . Heart disease Father 57       heart attack    Past Surgical History:  Procedure Laterality Date  . APPENDECTOMY     patient unsure of date  . BACK SURGERY    . BLADDER REPAIR    . CHOLECYSTECTOMY     Patient unsure of date.  . CORONARY ANGIOPLASTY  11/16/2001  1 stent  . ESOPHAGEAL MANOMETRY N/A 03/25/2013   Procedure: ESOPHAGEAL MANOMETRY (EM);  Surgeon: Inda Castle, MD;  Location: [facility];  Service: Endoscopy;  Laterality: N/A;  . ESOPHAGOGASTRODUODENOSCOPY N/A 03/15/2013   Procedure: ESOPHAGOGASTRODUODENOSCOPY (EGD);  Surgeon: Jerene Bears, MD;  Location[facility] ENDOSCOPY;  Service: Endoscopy;  Laterality: N/A;  . ESOPHAGOGASTRODUODENOSCOPY N/A 12/25/2015   Procedure: ESOPHAGOGASTRODUODENOSCOPY (EGD);  Surgeon: Doran Stabler, MD;  Location: [facility] ENDOSCOPY;  Service: Endoscopy;  Laterality: N/A;  . EYE SURGERY  11/2000   bilateral cataracts with lens implant  . FIXATION KYPHOPLASTY LUMBAR SPINE  X 2   "L3-4"  . HEMORRHOID SURGERY    . HIATAL HERNIA REPAIR  06/03/2011   Procedure: LAPAROSCOPIC REPAIR OF HIATAL HERNIA;  Surgeon: Adin Hector, MD;  Location: [facility];  Service: General;  Laterality: N/A;  . HIATAL HERNIA REPAIR N/A 04/24/2013   Procedure: LAPAROSCOPIC  REPAIR RECURRENT PARASOPHAGEAL HIATAL HERNIA WITH FUNDOPLICATION;  Surgeon: Adin Hector, MD;  Location: [facility];  Service: General;  Laterality: N/A;  . INSERTION OF MESH N/A 04/24/2013   Procedure: INSERTION OF MESH;  Surgeon: Adin Hector,  MD;  Location: [facility];  Service: General;  Laterality: N/A;  . LAPAROSCOPIC LYSIS OF ADHESIONS N/A 04/24/2013   Procedure: LAPAROSCOPIC LYSIS OF ADHESIONS;  Surgeon: Adin Hector, MD;  Location: [facility];  Service: General;  Laterality: N/A;  . LAPAROSCOPIC NISSEN FUNDOPLICATION  1/96/2229   Procedure: LAPAROSCOPIC NISSEN FUNDOPLICATION;  Surgeon: Adin Hector, MD;  Location: [facility];  Service: General;  Laterality: N/A;  . LUMBAR FUSION  12/07/2015   Right L2-3 facetectomy, posterolateral fusion L2-3 with pedicle screws, rods, local bone graft, Vivigen cancellous chips. Fusion extended to the L1 level with pedicle screws and rods  . LUMBAR LAMINECTOMY/DECOMPRESSION MICRODISCECTOMY Right 06/26/2012   Procedure: LUMBAR LAMINECTOMY/DECOMPRESSION MICRODISCECTOMY;  Surgeon: Jessy Oto, MD;  Location: [facility];  Service: Orthopedics;  Laterality: Right;  RIGHT L4-5 MICRODISCECTOMY  . LUMBAR LAMINECTOMY/DECOMPRESSION MICRODISCECTOMY N/A 05/29/2015   Procedure: RIGHT L2-3 MICRODISCECTOMY;  Surgeon: Jessy Oto, MD;  Location: [facility];  Service: Orthopedics;  Laterality: N/A;  . TOTAL ABDOMINAL HYSTERECTOMY  1975   partial   Social History   Occupational History  . Retired Retired    worked at [employer]  . Smoking status: Never Smoker  . Smokeless tobacco: Never Used  . Alcohol use No  . Drug use: No  . Sexual activity: No

## 2017-02-06 NOTE — Patient Instructions (Signed)
  Plan: Avoid frequent bending and stooping  No lifting greater than 10 lbs. May use ice or moist heat for pain. Weight loss is of benefit. Handicap license is approved.

## 2017-02-10 ENCOUNTER — Other Ambulatory Visit: Payer: Self-pay | Admitting: Physician Assistant

## 2017-02-10 DIAGNOSIS — B372 Candidiasis of skin and nail: Secondary | ICD-10-CM

## 2017-02-13 ENCOUNTER — Telehealth (INDEPENDENT_AMBULATORY_CARE_PROVIDER_SITE_OTHER): Payer: Self-pay

## 2017-02-13 NOTE — Telephone Encounter (Signed)
Patient would like to have Dr. Ernestina Patches do facet block injections.  Advised patient that referral was sent to Dr. Hardin Negus.  Cb# is [phone].  Please advise. Thank you.

## 2017-02-14 DIAGNOSIS — N644 Mastodynia: Secondary | ICD-10-CM | POA: Diagnosis not present

## 2017-02-14 DIAGNOSIS — M940 Chondrocostal junction syndrome [Tietze]: Secondary | ICD-10-CM | POA: Diagnosis not present

## 2017-02-14 NOTE — Telephone Encounter (Signed)
I asked Dr. Louanne Skye about this, he states that the patient needs to call and see if Dr. Hardin Negus is ok with her getting the injections by Dr. Ernestina Patches. I called and lmom to advise patient of this and I advised that if he says it is ok they need to fax [place] a not saying that it is ok to get the injections by Dr. Ernestina Patches.

## 2017-02-17 ENCOUNTER — Telehealth: Payer: Self-pay

## 2017-02-17 MED ORDER — ALENDRONATE SODIUM 70 MG PO TABS: ORAL_TABLET | ORAL | 0 refills | Status: DC

## 2017-02-17 NOTE — Telephone Encounter (Signed)
Last Visit: 01/29/16 Next Visit: 04/11/17.  Labs: 01/04/17 WNL  Okay to refill per Dr. Estanislado Pandy

## 2017-02-17 NOTE — Telephone Encounter (Signed)
Patient would like a Rx refill on Fosamax.  [facility] is [facility] [address].  Cb# is [phone] or [phone].  Please advise.  Thank You.

## 2017-02-17 NOTE — Addendum Note (Signed)
Addended by: Carole Binning on: 02/17/2017 10:45 AM   Modules accepted: Orders

## 2017-03-03 DIAGNOSIS — G894 Chronic pain syndrome: Secondary | ICD-10-CM | POA: Diagnosis not present

## 2017-03-03 DIAGNOSIS — R079 Chest pain, unspecified: Secondary | ICD-10-CM | POA: Diagnosis not present

## 2017-03-03 DIAGNOSIS — M4726 Other spondylosis with radiculopathy, lumbar region: Secondary | ICD-10-CM | POA: Diagnosis not present

## 2017-03-03 DIAGNOSIS — Z79891 Long term (current) use of opiate analgesic: Secondary | ICD-10-CM | POA: Diagnosis not present

## 2017-03-10 ENCOUNTER — Emergency Department (HOSPITAL_COMMUNITY): Payer: PPO

## 2017-03-10 ENCOUNTER — Encounter (HOSPITAL_COMMUNITY): Payer: Self-pay | Admitting: Emergency Medicine

## 2017-03-10 ENCOUNTER — Emergency Department (HOSPITAL_COMMUNITY)
Admission: EM | Admit: 2017-03-10 | Discharge: 2017-03-10 | Disposition: A | Discharge status: Home / Self Care | Payer: PPO | Attending: Emergency Medicine | Admitting: Emergency Medicine

## 2017-03-10 DIAGNOSIS — R112 Nausea with vomiting, unspecified: Secondary | ICD-10-CM | POA: Insufficient documentation

## 2017-03-10 DIAGNOSIS — Z79899 Other long term (current) drug therapy: Secondary | ICD-10-CM | POA: Insufficient documentation

## 2017-03-10 DIAGNOSIS — R1110 Vomiting, unspecified: Secondary | ICD-10-CM | POA: Diagnosis not present

## 2017-03-10 DIAGNOSIS — R9431 Abnormal electrocardiogram [ECG] [EKG]: Secondary | ICD-10-CM | POA: Diagnosis not present

## 2017-03-10 DIAGNOSIS — J45909 Unspecified asthma, uncomplicated: Secondary | ICD-10-CM | POA: Insufficient documentation

## 2017-03-10 DIAGNOSIS — I10 Essential (primary) hypertension: Secondary | ICD-10-CM | POA: Diagnosis not present

## 2017-03-10 DIAGNOSIS — I2510 Atherosclerotic heart disease of native coronary artery without angina pectoris: Secondary | ICD-10-CM | POA: Insufficient documentation

## 2017-03-10 DIAGNOSIS — R1084 Generalized abdominal pain: Secondary | ICD-10-CM | POA: Diagnosis not present

## 2017-03-10 DIAGNOSIS — R000 Tachycardia, unspecified: Secondary | ICD-10-CM | POA: Diagnosis not present

## 2017-03-10 LAB — URINALYSIS, ROUTINE W REFLEX MICROSCOPIC
BACTERIA UA: NONE SEEN
BILIRUBIN URINE: NEGATIVE
Glucose, UA: NEGATIVE mg/dL
Hgb urine dipstick: NEGATIVE
KETONES UR: 20 mg/dL — AB
LEUKOCYTES UA: NEGATIVE
Nitrite: NEGATIVE
PH: 7 (ref 5.0–8.0)
PROTEIN: 30 mg/dL — AB
RBC / HPF: NONE SEEN RBC/hpf (ref 0–5)
Specific Gravity, Urine: 1.032 — ABNORMAL HIGH (ref 1.005–1.030)

## 2017-03-10 LAB — COMPREHENSIVE METABOLIC PANEL
ALT: 20 U/L (ref 14–54)
ANION GAP: 15 (ref 5–15)
AST: 28 U/L (ref 15–41)
Albumin: 4.9 g/dL (ref 3.5–5.0)
Alkaline Phosphatase: 61 U/L (ref 38–126)
BUN: 12 mg/dL (ref 6–20)
CHLORIDE: 97 mmol/L — AB (ref 101–111)
CO2: 21 mmol/L — AB (ref 22–32)
CREATININE: 1.03 mg/dL — AB (ref 0.44–1.00)
Calcium: 9.8 mg/dL (ref 8.9–10.3)
GFR, EST AFRICAN AMERICAN: 58 mL/min — AB (ref >60)
GFR, EST NON AFRICAN AMERICAN: 50 mL/min — AB (ref >60)
Glucose, Bld: 163 mg/dL — ABNORMAL HIGH (ref 65–99)
POTASSIUM: 4.1 mmol/L (ref 3.5–5.1)
SODIUM: 133 mmol/L — AB (ref 135–145)
Total Bilirubin: 1.5 mg/dL — ABNORMAL HIGH (ref 0.3–1.2)
Total Protein: 8.5 g/dL — ABNORMAL HIGH (ref 6.5–8.1)

## 2017-03-10 LAB — CBC
HEMATOCRIT: 48.5 % — AB (ref 36.0–46.0)
HEMOGLOBIN: 17 g/dL — AB (ref 12.0–15.0)
MCH: 32.7 pg (ref 26.0–34.0)
MCHC: 35.1 g/dL (ref 30.0–36.0)
MCV: 93.3 fL (ref 78.0–100.0)
Platelets: 301 10*3/uL (ref 150–400)
RBC: 5.2 MIL/uL — AB (ref 3.87–5.11)
RDW: 13.1 % (ref 11.5–15.5)
WBC: 8.6 10*3/uL (ref 4.0–10.5)

## 2017-03-10 LAB — I-STAT CG4 LACTIC ACID, ED
Lactic Acid, Venous: 2.97 mmol/L (ref 0.5–1.9)
Lactic Acid, Venous: 1.31 mmol/L (ref 0.5–1.9)

## 2017-03-10 LAB — LIPASE, BLOOD: LIPASE: 18 U/L (ref 11–51)

## 2017-03-10 LAB — I-STAT TROPONIN, ED: TROPONIN I, POC: 0 ng/mL (ref 0.00–0.08)

## 2017-03-10 MED ORDER — ONDANSETRON HCL 4 MG PO TABS
4.0000 mg | ORAL_TABLET | Freq: Three times a day (TID) | ORAL | 0 refills | Status: DC | PRN
Start: 2017-03-10 — End: 2017-03-23

## 2017-03-10 MED ORDER — PROMETHAZINE HCL 25 MG/ML IJ SOLN
12.5000 mg | Freq: Once | INTRAMUSCULAR | Status: AC
  Administered 2017-03-10: 12.5 mg via INTRAVENOUS
  Filled 2017-03-10: qty 1

## 2017-03-10 MED ORDER — OXYCODONE-ACETAMINOPHEN 5-325 MG PO TABS: 1.0000 | ORAL_TABLET | ORAL | 0 refills | Status: DC | PRN

## 2017-03-10 MED ORDER — MORPHINE SULFATE (PF) 4 MG/ML IV SOLN
4.0000 mg | Freq: Once | INTRAVENOUS | Status: AC
  Administered 2017-03-10: 4 mg via INTRAVENOUS
  Filled 2017-03-10: qty 1

## 2017-03-10 MED ORDER — PROMETHAZINE HCL 25 MG PO TABS: 25.0000 mg | ORAL_TABLET | Freq: Four times a day (QID) | ORAL | 0 refills | Status: DC | PRN

## 2017-03-10 MED ORDER — SODIUM CHLORIDE 0.9 % IV BOLUS (SEPSIS)
1000.0000 mL | Freq: Once | INTRAVENOUS | Status: AC
  Administered 2017-03-10: 1000 mL via INTRAVENOUS

## 2017-03-10 MED ORDER — IOPAMIDOL (ISOVUE-300) INJECTION 61%
INTRAVENOUS | Status: AC
  Administered 2017-03-10: 100 mL
  Filled 2017-03-10: qty 100

## 2017-03-10 MED ORDER — ONDANSETRON HCL 4 MG/2ML IJ SOLN
4.0000 mg | Freq: Once | INTRAMUSCULAR | Status: AC
  Administered 2017-03-10: 4 mg via INTRAVENOUS
  Filled 2017-03-10: qty 2

## 2017-03-10 NOTE — ED Triage Notes (Signed)
Pt to ER for evaluation of 2 days abdominal pain, fatigue, and shortness of breath. Actively vomiting in triage. HR 140. Pt a/o x4. Denies cp.

## 2017-03-10 NOTE — ED Notes (Signed)
Unable to obtain Iv access IV consult placed

## 2017-03-10 NOTE — ED Provider Notes (Signed)
[facility] DEPART[facility] Provider Note   CSN: [id number] Arrival date & time: 03/10/17  4403     History   Chief Complaint Chief Complaint  Patient presents with  . Abdominal Pain  . Shortness of Breath    HPI [name] [name] is a 81 y.o. female.  The history is provided by the spouse, the patient and medical records. No language interpreter was used.  Abdominal Pain   This is a new problem. The current episode started more than 2 days ago. The problem occurs constantly. The problem has been gradually worsening. The pain is located in the generalized abdominal region. The quality of the pain is aching, dull and sharp. The pain is at a severity of 9/10. The pain is severe. Associated symptoms include nausea, vomiting and constipation. Pertinent negatives include fever, diarrhea, flatus (no flatus), dysuria, frequency and headaches. The symptoms are aggravated by palpation. Nothing relieves the symptoms. Past workup includes surgery.    Past Medical History:  Diagnosis Date  . Anxiety    takes Xanax daily as needed  . Asthma    Albuterol daily as needed  . CAD (coronary artery disease)    LHC 2003 with 40% pLAD, 30% mLAD, 100% D1 (moderate vessel), 100%  D2 (small vessel), 95% mid small OM2.  Pt had PTCA to D1;  D2 and OM2 small vessels and tx medically;  Last Myoview (2012): anterior infarct seen with scar, no ischemia. EF normal. Pt managed medically;  Lexiscan Myoview (12/14):  Low risk; ant scar with very mild peri-infarct ischemia; EF 55% with ant AK   . Chronic back pain    HNP, DDD, spinal stenosis, vertebral compression fractures.   . Chronic nausea    takes Zofran daily as needed.  normal gastric emptying study 03/2013  . Constipation    felt to be functional. takes Senokot and Miralax daily.  treated with Linzess in past.   . DIVERTICULOSIS, COLON 08/28/2008   Qualifier: Diagnosis of  By: [name], [name], [name]    . Elevated LFTs 2015   probably  med induced DILI, from Augmentin vs Pravastatin  . GERD (gastroesophageal reflux disease)    hx of esophageal stricture   . Hiatal hernia    Paraesophageal hernias. s/p fundoplications in 4742 and 04/2013  . History of bronchitis several of yrs ago  . History of colon polyps 03/2009, 03/2011   adenomatous colon polyps, no high grade dysplasia.   [name] History of vertigo    no meds  . Hyperlipidemia    takes Pravastatin daily  . Hypertension    takes Amlodipine,Metoprolol,and Losartan daily  . Nausea 03/2016  . Osteoporosis   . PONV (postoperative nausea and vomiting)    yrs ago  . Slow urinary stream    takes Rapaflo daily  . TIA (transient ischemic attack) 1995   no residual effects noted    Patient Active Problem List   Diagnosis Date Noted  . Costochondritis 11/14/2016  . Anxiety state 06/08/2016  . History of lumbar spinal fusion 04/13/2016  . Spondylolisthesis, lumbar region 04/13/2016  . Leukocytosis 03/28/2016  . Ileus ([place])   . Spinal stenosis, lumbar region, with neurogenic claudication 05/29/2015  . DDD (degenerative disc disease), lumbar 04/21/2015  . Encounter for Medicare annual wellness exam 02/27/2015  . Mixed hyperlipidemia 11/24/2014  . GERD  11/24/2014  . Depression, major, in partial remission ([place]) 09/26/2014  . Chronic pain syndrome 09/26/2014  . Coronary artery disease due to lipid rich plaque   .  Osteoporosis 02/10/2014  . Prediabetes 08/14/2013  . Vitamin D deficiency 08/14/2013  . Medication management 08/14/2013  . Constipation, chronic 04/03/2013  . MGUS (monoclonal gammopathy of unknown significance) 03/05/2013  . Herniated lumbar intervertebral disc 06/25/2012    Class: Acute  . Vertebral compression fracture ([place]) 06/23/2012  . ESOPHAGEAL STRICTURE 08/28/2008  . Asthma 07/29/2008  . Incarcerated recurrent paraesophageal hiatal hernia s/p lap redo repair [id number] 01/31/2008  . Essential hypertension 07/25/2007    Past Surgical History:    Procedure Laterality Date  . APPENDECTOMY     patient unsure of date  . BACK SURGERY    . BLADDER REPAIR    . CHOLECYSTECTOMY     Patient unsure of date.  . CORONARY ANGIOPLASTY  11/16/2001   1 stent  . ESOPHAGEAL MANOMETRY N/A 03/25/2013   Procedure: ESOPHAGEAL MANOMETRY (EM);  Surgeon: Inda Castle, MD;  Location: [facility] ENDOSCOPY;  Service: Endoscopy;  Laterality: N/A;  . ESOPHAGOGASTRODUODENOSCOPY N/A 03/15/2013   Procedure: ESOPHAGOGASTRODUODENOSCOPY (EGD);  Surgeon: Jerene Bears, MD;  Location: [facility] ENDOSCOPY;  Service: Endoscopy;  Laterality: N/A;  . ESOPHAGOGASTRODUODENOSCOPY N/A 12/25/2015   Procedure: ESOPHAGOGASTRODUODENOSCOPY (EGD);  Surgeon: Doran Stabler, MD;  Location: [facility] ENDOSCOPY;  Service: Endoscopy;  Laterality: N/A;  . EYE SURGERY  11/2000   bilateral cataracts with lens implant  . FIXATION KYPHOPLASTY LUMBAR SPINE  X 2   "L3-4"  . HEMORRHOID SURGERY    . HIATAL HERNIA REPAIR  06/03/2011   Procedure: LAPAROSCOPIC REPAIR OF HIATAL HERNIA;  Surgeon: Adin Hector, MD;  Location: [facility] ORS;  Service: General;  Laterality: N/A;  . HIATAL HERNIA REPAIR N/A 04/24/2013   Procedure: LAPAROSCOPIC  REPAIR RECURRENT PARASOPHAGEAL HIATAL HERNIA WITH FUNDOPLICATION;  Surgeon: Adin Hector, MD;  Location: [facility] ORS;  Service: General;  Laterality: N/A;  . INSERTION OF MESH N/A 04/24/2013   Procedure: INSERTION OF MESH;  Surgeon: Adin Hector, MD;  Location: [facility] ORS;  Service: General;  Laterality: N/A;  . LAPAROSCOPIC LYSIS OF ADHESIONS N/A 04/24/2013   Procedure: LAPAROSCOPIC LYSIS OF ADHESIONS;  Surgeon: Adin Hector, MD;  Location: [facility] ORS;  Service: General;  Laterality: N/A;  . LAPAROSCOPIC NISSEN FUNDOPLICATION  6/38/7564   Procedure: LAPAROSCOPIC NISSEN FUNDOPLICATION;  Surgeon: Adin Hector, MD;  Location: [facility] ORS;  Service: General;  Laterality: N/A;  . LUMBAR FUSION  12/07/2015   Right L2-3 facetectomy, posterolateral fusion L2-3 with pedicle screws, rods, local  bone graft, Vivigen cancellous chips. Fusion extended to the L1 level with pedicle screws and rods  . LUMBAR LAMINECTOMY/DECOMPRESSION MICRODISCECTOMY Right 06/26/2012   Procedure: LUMBAR LAMINECTOMY/DECOMPRESSION MICRODISCECTOMY;  Surgeon: Jessy Oto, MD;  Location: [facility];  Service: Orthopedics;  Laterality: Right;  RIGHT L4-5 MICRODISCECTOMY  . LUMBAR LAMINECTOMY/DECOMPRESSION MICRODISCECTOMY N/A 05/29/2015   Procedure: RIGHT L2-3 MICRODISCECTOMY;  Surgeon: Jessy Oto, MD;  Location: [facility];  Service: Orthopedics;  Laterality: N/A;  . TOTAL ABDOMINAL HYSTERECTOMY  1975   partial    OB History    No data available       Home Medications    Prior to Admission medications   Medication Sig Start Date End Date Taking? Authorizing Provider  acetaminophen (TYLENOL) 325 MG tablet Take 2 tablets (650 mg total) by mouth every 6 (six) hours as needed for mild pain (or Fever >/= 101). 12/26/15   Roxan Hockey, MD  albuterol (PROAIR HFA) 108 (90 Base) MCG/ACT inhaler Inhale 2 puffs into the lungs every 6 (six) hours as needed for  wheezing or shortness of breath. 09/22/16   Vicie Mutters, PA-C  alendronate (FOSAMAX) 70 MG tablet TAKE 1 TABLET BY MOUTH ONCE A WEEK. TAKE WITH A FULL GLASS OF WATER ON AN EMPTY STOMACH. 02/17/17   [name], MD  ALPRAZolam (XANAX) 0.5 MG tablet TAKE 1/2 TO 1 (ONE-HALF TO ONE) TABLET BY MOUTH TWICE DAILY TO THREE TIMES DAILY AS NEEDED FOR  NERVES 01/19/17   [name], MD  amLODipine (NORVASC) 10 MG tablet Take 1 tablet (10 mg total) by mouth at bedtime. 05/10/16   [name], MD  Cholecalciferol (VITAMIN D3) 2000 UNITS TABS Take 6,000 Units by mouth daily.     [provider]  diclofenac sodium (VOLTAREN) 1 % GEL Apply 4 g topically 4 (four) times daily. 12/06/16   Jessy Oto, MD  gabapentin (NEURONTIN) 600 MG tablet Take 1/2 to 1 tablet 3 to 4 x / day as needed for nerve pains. 01/18/17 08/18/17  [name], MD    ipratropium-albuterol (DUONEB) 0.5-2.5 (3) MG/3ML SOLN USE ONE VIAL IN NEBULIZER 4 TIMES DAILY 02/26/14   [name], MD  ketoconazole (NIZORAL) 2 % cream APPLY 1 APPLICATION TOPICALLY TWO TIMES DAILY 02/10/17   Vicie Mutters, PA-C  losartan (COZAAR) 100 MG tablet TAKE ONE TABLET BY MOUTH ONCE DAILY ( DISCONTINUE BENICAR) 04/14/16   [name], MD  magnesium oxide (MAG-OX) 400 MG tablet Take 400 mg by mouth 2 (two) times daily.     [provider]  meclizine (ANTIVERT) 25 MG tablet 1/2-1 pill up to 3 times daily for motion sickness/dizziness 06/08/16   Vicie Mutters, PA-C  metoprolol (LOPRESSOR) 50 MG tablet TAKE ONE TABLET BY MOUTH TWICE DAILY FOR BLOOD PRESSURE 02/11/16   Vicie Mutters, PA-C  mometasone-formoterol (DULERA) 200-5 MCG/ACT AERO Inhale 2 puffs into the lungs 2 (two) times daily. 07/03/13   Kelby Aline, PA-C  nitroGLYCERIN (NITROSTAT) 0.4 MG SL tablet Place 1 tablet (0.4 mg total) under the tongue every 5 (five) minutes as needed for chest pain. 09/04/14   Larey Dresser, MD  ondansetron (ZOFRAN-ODT) 8 MG disintegrating tablet DISSOLVE ONE TABLET IN MOUTH EVERY 8 HOURS AS NEEDED FOR NAUSEA AND VOMITING 04/07/16   [name], MD  OVER THE COUNTER MEDICATION Take 1,000 mg by mouth 2 (two) times daily. [place] Calcium 1000mg     [provider]  oxyCODONE (OXY IR/ROXICODONE) 5 MG immediate release tablet Take 1 tablet (5 mg total) by mouth every 4 (four) hours as needed for severe pain or breakthrough pain. 12/26/15   Roxan Hockey, MD  pantoprazole (PROTONIX) 40 MG tablet Take 1 tablet (40 mg total) by mouth daily. 06/06/16   [name], MD  polyethylene glycol [facility] / Floria Raveling) packet Take 17 g by mouth 2 (two) times daily. 10/13/16   Jessy Oto, MD  potassium chloride SA (K-DUR,KLOR-CON) 20 MEQ tablet Take 1 tablet (20 mEq total) by mouth 2 (two) times daily. 11/25/14   [name], MD  pravastatin (PRAVACHOL) 40 MG tablet TAKE 1  TABLET BY MOUTH ONCE DAILY 12/25/16   [name], MD  promethazine (PHENERGAN) 25 MG tablet TAKE ONE TABLET BY MOUTH EVERY 6 HOURS AS NEEDED FOR NAUSEA OR VOMITING 10/28/16   Vicie Mutters, PA-C  senna-docusate (SENOKOT-S) 8.6-50 MG tablet Take 2 tablets by mouth 2 (two) times daily. 12/26/15   Roxan Hockey, MD  silodosin (RAPAFLO) 8 MG CAPS capsule Take 8 mg by mouth every other day.     [provider]  sucralfate (CARAFATE) 1  g tablet TAKE ONE TABLET BY MOUTH 4 TIMES DAILY BEFORE MEALS AND AT BEDTIME FOR HEARTBURN 01/06/16   [name], MD    Family History Family History  Problem Relation Age of Onset  . Colon cancer Mother 52  . Heart disease Father 84       heart attack    Social History Social History  Substance Use Topics  . Smoking status: Never Smoker  . Smokeless tobacco: Never Used  . Alcohol use No     Allergies   Ace inhibitors; Aspartame; Atorvastatin; Biaxin [clarithromycin]; Daliresp [roflumilast]; Erythromycin; and Levofloxacin   Review of Systems Review of Systems  Constitutional: Positive for fatigue. Negative for chills, diaphoresis and fever.  HENT: Negative for congestion.   Respiratory: Positive for shortness of breath (imptrovrf). Negative for cough, chest tightness, wheezing and stridor.   Cardiovascular: Negative for chest pain and palpitations.  Gastrointestinal: Positive for abdominal pain, constipation, nausea and vomiting. Negative for blood in stool, diarrhea and flatus (no flatus).  Genitourinary: Negative for dysuria, frequency and pelvic pain.  Musculoskeletal: Negative for back pain, neck pain and neck stiffness.  Skin: Negative for rash and wound.  Neurological: Negative for weakness, light-headedness, numbness and headaches.  Psychiatric/Behavioral: Negative for agitation and confusion.  All other systems reviewed and are negative.    Physical Exam Updated Vital Signs BP (!) 166/107 (BP Location: Right Arm)    Pulse (!) 137   Temp 98.1 F (36.7 C) (Oral)   Resp 19   SpO2 98%   Physical Exam  Constitutional: She is oriented to person, place, and time. She appears well-developed and well-nourished. No distress.  HENT:  Head: Normocephalic.  Mouth/Throat: Oropharynx is clear and moist. No oropharyngeal exudate.  Eyes: Pupils are equal, round, and reactive to light. Conjunctivae and EOM are normal.  Neck: Normal range of motion.  Cardiovascular: Regular rhythm, normal heart sounds and intact distal pulses.  Tachycardia present.   No murmur heard. Pulmonary/Chest: Effort normal. No stridor. Tachypnea noted. No respiratory distress. She has no wheezes. She has no rhonchi. She has no rales. She exhibits no tenderness.  Abdominal: There is tenderness.  Musculoskeletal: She exhibits no tenderness.  Neurological: She is alert and oriented to person, place, and time. No sensory deficit. She exhibits normal muscle tone.  Skin: Capillary refill takes less than 2 seconds. No rash noted. She is not diaphoretic. No erythema.  Psychiatric: She has a normal mood and affect.  Nursing note and vitals reviewed.    ED Treatments / Results  Labs (all labs ordered are listed, but only abnormal results are displayed) Labs Reviewed  COMPREHENSIVE METABOLIC PANEL - Abnormal; Notable for the following:       Result Value   Sodium 133 (*)    Chloride 97 (*)    CO2 21 (*)    Glucose, Bld 163 (*)    Creatinine, Ser 1.03 (*)    Total Protein 8.5 (*)    Total Bilirubin 1.5 (*)    GFR calc non Af Amer 50 (*)    GFR calc Af Amer 58 (*)    All other components within normal limits  CBC - Abnormal; Notable for the following:    RBC 5.20 (*)    Hemoglobin 17.0 (*)    HCT 48.5 (*)    All other components within normal limits  URINALYSIS, ROUTINE W REFLEX MICROSCOPIC - Abnormal; Notable for the following:    Color, Urine STRAW (*)    Specific Gravity, Urine 1.032 (*)  Ketones, ur 20 (*)    Protein, ur 30 (*)      Squamous Epithelial / LPF 0-5 (*)    All other components within normal limits  I-STAT CG4 LACTIC ACID, ED - Abnormal; Notable for the following:    Lactic Acid, Venous 2.97 (*)    All other components within normal limits  LIPASE, BLOOD  I-STAT TROPONIN, ED  I-STAT CG4 LACTIC ACID, ED  I-STAT TROPONIN, ED    EKG  EKG Interpretation  Date/Time:  Friday March 10 2017 09:36:13 EDT Ventricular Rate:  90 PR Interval:  140 QRS Duration: 84 QT Interval:  382 QTC Calculation: 467 R Axis:   2 Text Interpretation:  Normal sinus rhythm Septal infarct , age undetermined Possible Lateral infarct , age undetermined Abnormal ECG when compared to prior, no significant changes seen.  No STEMI Confirmed by Antony Blackbird [phone]) on 03/10/2017 10:12:04 AM       Radiology Ct Abdomen Pelvis W Contrast  Result Date: 03/10/2017 CLINICAL DATA:  81 year old female with abdominal and pelvic pain, nausea, vomiting and constipation for 2 days. EXAM: CT ABDOMEN AND PELVIS WITH CONTRAST TECHNIQUE: Multidetector CT imaging of the abdomen and pelvis was performed using the standard protocol following bolus administration of intravenous contrast. CONTRAST:  153mL ISOVUE-300 IOPAMIDOL (ISOVUE-300) INJECTION 61% COMPARISON:  No significant hepatic abnormalities noted. The patient is status post cholecystectomy. No biliary dilatation. FINDINGS: Lower chest: No acute abnormality Hepatobiliary: No significant hepatic abnormalities noted. The patient is status post cholecystectomy. CBD dilatation has decreased since the prior study, now measuring 10 mm, previously 12 mm. Pancreas: Unremarkable Spleen: Unremarkable Adrenals/Urinary Tract: No significant abnormalities of the kidneys, adrenal glands or bladder. Stomach/Bowel: No bowel obstruction, bowel wall thickening or inflammatory changes. Nissen fundoplication again noted. Vascular/Lymphatic: Aortic atherosclerosis. No enlarged abdominal or pelvic lymph nodes.  Reproductive: Status post hysterectomy. No adnexal masses. Other: No ascites, focal collection or pneumoperitoneum. Musculoskeletal: Lumbar spine augmentation changes, compression fractures and surgical changes again noted. No acute bony abnormalities are identified. IMPRESSION: 1. No evidence of acute abnormality 2. Mild CBD dilatation, decreased from 03/27/2016. 3.  Aortic Atherosclerosis (ICD10-I70.0). Electronically Signed   By: Margarette Canada M.D.   On: 03/10/2017 10:45   Dg Chest Portable 1 View  Result Date: 03/10/2017 CLINICAL DATA:  Tachycardia. EXAM: PORTABLE CHEST 1 VIEW COMPARISON:  12/09/2015 FINDINGS: Chronic cardiomegaly. Negative aortic and hilar contours. There is no edema, consolidation, effusion, or pneumothorax. Osteopenia. No acute osseous finding. Chronic linear metallic density overlapping the gastric bubble. IMPRESSION: 1. No acute finding. 2. Chronic cardiomegaly. Electronically Signed   By: Monte Fantasia M.D.   On: 03/10/2017 09:41    Procedures Procedures (including critical care time)  Medications Ordered in ED Medications  sodium chloride 0.9 % bolus 1,000 mL (0 mLs Intravenous Stopped 03/10/17 1214)  morphine 4 MG/ML injection 4 mg (4 mg Intravenous Given 03/10/17 1007)  ondansetron (ZOFRAN) injection 4 mg (4 mg Intravenous Given 03/10/17 1007)  iopamidol (ISOVUE-300) 61 % injection (100 mLs  Contrast Given 03/10/17 1018)  ondansetron (ZOFRAN) injection 4 mg (4 mg Intravenous Given 03/10/17 1249)  morphine 4 MG/ML injection 4 mg (4 mg Intravenous Given 03/10/17 1312)  promethazine (PHENERGAN) injection 12.5 mg (12.5 mg Intravenous Given 03/10/17 1313)  sodium chloride 0.9 % bolus 1,000 mL (0 mLs Intravenous Stopped 03/10/17 1538)     Initial Impression / Assessment and Plan / ED Course  I have reviewed the triage vital signs and the nursing notes.  Pertinent labs &  imaging results that were available during my care of the patient were reviewed by me and considered in my  medical decision making (see chart for details).     [name] [name] is a 81 y.o. female with past medical history significant for hypertension, asthma, MGUS, CAD, GERD, diverticulosis, TIA, and prior abdominal surgeries who presents with abdominal pain, nausea, vomiting, shortness of breath, and tachycardia.  Patient is accompanied by spouse he reports that for the last 3 days, patient has been feeling bad.  He says that she has been having nausea and vomiting and has not been eating or drinking.  She describes having diffuse abdominal pain that is a 9 out of 10 in severity.  It is constant.  She says she is passing no flatus.  She does report having one very small constipated bowel movement this morning that was not normal.  She denied bleeding.  She denies chest pain but does report some mild shortness of breath previously that has currently resolved.  She denies any fevers or chills.  She reports some lightheadedness but denies any other neurologic complaints.  She has no back pain or flank pain.  She denies recent injuries.  Next  On exam, patient has abdominal tenderness.  Patient's lungs had normal breath sounds.  No CVA tenderness.  No evidence of trauma or bruising.  Patient was tachycardic on arrival in the 130s.  Suspect dehydration given the lack of p.o. intake for 3 days with vomiting.  Given history of abdominal surgeries, decreased flatus, and severe pain, there is clinical concern for bowel obstruction.  Patient does report that she has had a fundoplication surgery as well as cholecystectomy and appendectomy in the past.  Patient will have laboratory testing and CT imaging to further evaluate.  For the shortness of breath, chest x-ray will also be included.  Patient will be given pain medicine, nausea medicine and fluids during initial workup.  Anticipate reassessment after imaging.  1:21 PM Patient's laboratory testing primarily returned.  Lactic acid initially elevated at 2.97 however  after 1 L of fluids it is improved to 1.3.  Urinalysis showed protein and ketones but no evidence of urinary tract infection.  Troponin negative.  Lipase not elevated.  Metabolic panel showed similar creatinine to prior 1.03.  Hepatic function normal.  CT scan showed no evidence of acute abnormality and reviewed common bile duct dilation from prior.  Chest x-ray shows no pneumonia or other ovale.  Patient reassessed by me and continued to be writhing in pain with nausea and vomiting.  Patient said that the Zofran did not help despite a second dose.  She will be given Phenergan as she says this is worked for her in the past.  Patient was given more pain medicine.  Although patient's workup has been reassuring aside from the lactic acid that is now improved, patient continues to appear ill.  Patient will be reassessed after further pain and nausea medicine.  Patient is unable to tolerate p.o. and continues to be in severe discomfort, patient may need admission.  4:26 PM On reassessment, patient was able to tolerate drinking ginger ale and was feeling a little bit better.  She feels that she is able to try going home.  Patient will be given prescription for pain medicine and nausea medicine to help with her symptoms.  Patient was given strict return precautions for any new or worsen symptoms as well as follow-up instructions in the next few days with her PCP.  Patient  and husband understood the plan of care and had no other questions or concerns.  Patient was discharged in good condition.   Final Clinical Impressions(s) / ED Diagnoses   Final diagnoses:  Generalized abdominal pain  Non-intractable vomiting with nausea, unspecified vomiting type    New Prescriptions New Prescriptions   ONDANSETRON (ZOFRAN) 4 MG TABLET    Take 1 tablet (4 mg total) by mouth every 8 (eight) hours as needed for nausea or vomiting.   OXYCODONE-ACETAMINOPHEN (PERCOCET/ROXICET) 5-325 MG TABLET    Take 1 tablet by mouth  every 4 (four) hours as needed for severe pain.   PROMETHAZINE (PHENERGAN) 25 MG TABLET    Take 1 tablet (25 mg total) by mouth every 6 (six) hours as needed for nausea or vomiting.    Clinical Impression: 1. Generalized abdominal pain   2. Non-intractable vomiting with nausea, unspecified vomiting type     Disposition: Discharge  Condition: Good  I have discussed the results, Dx and Tx plan with the pt(& family if present). He/she/they expressed understanding and agree(s) with the plan. Discharge instructions discussed at great length. Strict return precautions discussed and pt &/or family have verbalized understanding of the instructions. No further questions at time of discharge.    New Prescriptions   ONDANSETRON (ZOFRAN) 4 MG TABLET    Take 1 tablet (4 mg total) by mouth every 8 (eight) hours as needed for nausea or vomiting.   OXYCODONE-ACETAMINOPHEN (PERCOCET/ROXICET) 5-325 MG TABLET    Take 1 tablet by mouth every 4 (four) hours as needed for severe pain.   PROMETHAZINE (PHENERGAN) 25 MG TABLET    Take 1 tablet (25 mg total) by mouth every 6 (six) hours as needed for nausea or vomiting.    Follow Up: [name], MD [address] [place] [address] [phone]  Schedule an appointment as soon as possible for a visit    [place] [address] [id number] [place] [phone]  If symptoms worsen     Bandon Sherwin, Gwenyth Allegra, MD 03/10/17 [phone]

## 2017-03-10 NOTE — ED Notes (Signed)
Patient transported to CT 

## 2017-03-10 NOTE — ED Notes (Signed)
Patient transported to X-ray 

## 2017-03-10 NOTE — ED Notes (Signed)
Pt stable, ambulatory, states understanding of discharge instructions 

## 2017-03-10 NOTE — Discharge Instructions (Signed)
Your workup today did not show evidence of bowel obstruction.  We did not find evidence of infection.  Given your reassuring lab testing and imaging as well as improvement in your symptoms after medications, we feel you are safe for discharge home.  Please use the pain and nausea medicine to help with your discomfort and to maintain hydration.  If you get to the point where your pain is uncontrolled and you are not tolerating fluids, please return to the emergency department.  Please follow-up with your PCP.

## 2017-03-13 ENCOUNTER — Other Ambulatory Visit: Payer: Self-pay

## 2017-03-13 MED ORDER — PROMETHAZINE HCL 25 MG PO TABS: 25.0000 mg | ORAL_TABLET | Freq: Four times a day (QID) | ORAL | 0 refills | Status: DC | PRN

## 2017-03-21 ENCOUNTER — Telehealth (INDEPENDENT_AMBULATORY_CARE_PROVIDER_SITE_OTHER): Payer: Self-pay | Admitting: Specialist

## 2017-03-21 ENCOUNTER — Other Ambulatory Visit (INDEPENDENT_AMBULATORY_CARE_PROVIDER_SITE_OTHER): Payer: Self-pay | Admitting: Family

## 2017-03-21 NOTE — Telephone Encounter (Signed)
[name],[name] L 01-29-1936   I rescheduled pt for 04/27/17 @ 9:45 am,please call pt if appt opens up.

## 2017-03-21 NOTE — Telephone Encounter (Signed)
I tried to call to see if she can come in this afternoon--No answer

## 2017-03-22 ENCOUNTER — Ambulatory Visit (INDEPENDENT_AMBULATORY_CARE_PROVIDER_SITE_OTHER): Payer: PPO | Admitting: Specialist

## 2017-03-22 NOTE — Telephone Encounter (Signed)
Polyethylene glycol Powder Refill Request

## 2017-03-23 ENCOUNTER — Encounter: Payer: Self-pay | Admitting: Physician Assistant

## 2017-03-23 ENCOUNTER — Ambulatory Visit: Payer: PPO | Admitting: Physician Assistant

## 2017-03-23 VITALS — BP 120/76 | HR 85 | Temp 97.6°F | Resp 16 | Ht 65.0 in | Wt 147.0 lb

## 2017-03-23 DIAGNOSIS — K551 Chronic vascular disorders of intestine: Secondary | ICD-10-CM

## 2017-03-23 DIAGNOSIS — R109 Unspecified abdominal pain: Secondary | ICD-10-CM

## 2017-03-23 DIAGNOSIS — R112 Nausea with vomiting, unspecified: Secondary | ICD-10-CM

## 2017-03-23 MED ORDER — PROMETHAZINE HCL 25 MG PO TABS: 25.0000 mg | ORAL_TABLET | Freq: Four times a day (QID) | ORAL | 1 refills | Status: DC | PRN

## 2017-03-23 MED ORDER — PANTOPRAZOLE SODIUM 40 MG PO TBEC: 40.0000 mg | DELAYED_RELEASE_TABLET | Freq: Every day | ORAL | 1 refills | Status: DC

## 2017-03-23 NOTE — Patient Instructions (Addendum)
Get back on or make sure you are taking the protonix once a day 30 mins before food Stop the fosamax for now Stop magnesium for now Start on miralax daily Can do phenerghan as needed   Follow up with GI

## 2017-03-23 NOTE — Progress Notes (Signed)
Subjective:    Patient ID: [name] [name], female    DOB: [birth year], 81 y.o.   MRN: [id number]  HPI 81 y.o. WF with history of chronic pain, CAD, chronic nausea (normal gastric emptying), COPD, constipation, s/p hiatal hernia repair Dec 2014 among other AB surgeries presents with nausea, she was in the ER 11/02 for nausea and AB pain. Sodium was low in ER, WBC nomal, lactic acid was elevated at 2.97 improved with 1 L of fluid,   Normal Ct AB She is not on zofran or phenergan at this time, she is on magnesium and fosamax, she is not on miralax or protonix at this time.  States nausea has been worse x 2 weeks. She states she can keep food down but has decreased appetite, and AB pain with eating food, does not get full quickly with eating, she has been drinking ginger ale. She has not had her miralax but once, last BM was Mon/Tues. She is passing gas . She has lost 8 lbs. She has been having more cough with green mucus since she has left the ER.   Wt Readings from Last 3 Encounters:  03/23/17 147 lb (66.7 kg)  02/06/17 155 lb (70.3 kg)  01/18/17 155 lb 12.8 oz (70.7 kg)    Blood pressure 120/76, pulse 85, temperature 97.6 F (36.4 C), resp. rate 16, height 5\' 5"  (1.651 m), weight 147 lb (66.7 kg), SpO2 96 %.  Medications Current Outpatient Medications on File Prior to Visit  Medication Sig  . acetaminophen (TYLENOL) 325 MG tablet Take 2 tablets (650 mg total) by mouth every 6 (six) hours as needed for mild pain (or Fever >/= 101).  [name] albuterol (PROAIR HFA) 108 (90 Base) MCG/ACT inhaler Inhale 2 puffs into the lungs every 6 (six) hours as needed for wheezing or shortness of breath.  [name] alendronate (FOSAMAX) 70 MG tablet TAKE 1 TABLET BY MOUTH ONCE A WEEK. TAKE WITH A FULL GLASS OF WATER ON AN EMPTY STOMACH.  [name] ALPRAZolam (XANAX) 0.5 MG tablet TAKE 1/2 TO 1 (ONE-HALF TO ONE) TABLET BY MOUTH TWICE DAILY TO THREE TIMES DAILY AS NEEDED FOR  NERVES  . amLODipine (NORVASC) 10 MG tablet Take 1  tablet (10 mg total) by mouth at bedtime. (Patient taking differently: Take 5 mg by mouth at bedtime. )  . Cholecalciferol (VITAMIN D3) 2000 UNITS TABS Take 6,000 Units by mouth daily.   . diclofenac sodium (VOLTAREN) 1 % GEL Apply 4 g topically 4 (four) times daily.  [name] gabapentin (NEURONTIN) 300 MG capsule Take 300 mg by mouth 3 (three) times daily.  [name] ipratropium-albuterol (DUONEB) 0.5-2.5 (3) MG/3ML SOLN USE ONE VIAL IN NEBULIZER 4 TIMES DAILY  . ketoconazole (NIZORAL) 2 % cream APPLY 1 APPLICATION TOPICALLY TWO TIMES DAILY  . losartan (COZAAR) 100 MG tablet TAKE ONE TABLET BY MOUTH ONCE DAILY ( DISCONTINUE BENICAR)  . magnesium oxide (MAG-OX) 400 MG tablet Take 400 mg by mouth 2 (two) times daily.   . meclizine (ANTIVERT) 25 MG tablet 1/2-1 pill up to 3 times daily for motion sickness/dizziness  . metoprolol (LOPRESSOR) 50 MG tablet TAKE ONE TABLET BY MOUTH TWICE DAILY FOR BLOOD PRESSURE  . mometasone-formoterol (DULERA) 200-5 MCG/ACT AERO Inhale 2 puffs into the lungs 2 (two) times daily.  . nitroGLYCERIN (NITROSTAT) 0.4 MG SL tablet Place 1 tablet (0.4 mg total) under the tongue every 5 (five) minutes as needed for chest pain.  [name] ondansetron (ZOFRAN) 4 MG tablet Take 1 tablet (4 mg  total) by mouth every 8 (eight) hours as needed for nausea or vomiting.  [name] OVER THE COUNTER MEDICATION Take 1,000 mg by mouth 2 (two) times daily. [place] Calcium 1000mg   . oxyCODONE-acetaminophen (PERCOCET/ROXICET) 5-325 MG tablet Take 1 tablet by mouth every 4 (four) hours as needed for severe pain.  . pantoprazole (PROTONIX) 40 MG tablet Take 1 tablet (40 mg total) by mouth daily.  . polyethylene glycol (MIRALAX / GLYCOLAX) packet Take 17 g by mouth 2 (two) times daily.  . potassium chloride SA (K-DUR,KLOR-CON) 20 MEQ tablet Take 1 tablet (20 mEq total) by mouth 2 (two) times daily. (Patient not taking: Reported on 03/10/2017)  . pravastatin (PRAVACHOL) 40 MG tablet TAKE 1 TABLET BY MOUTH ONCE DAILY  .  promethazine (PHENERGAN) 25 MG tablet Take 1 tablet (25 mg total) every 6 (six) hours as needed by mouth for nausea or vomiting.  . senna-docusate (SENOKOT-S) 8.6-50 MG tablet Take 2 tablets by mouth 2 (two) times daily.  . silodosin (RAPAFLO) 8 MG CAPS capsule Take 8 mg by mouth every other day.    No current facility-administered medications on file prior to visit.     Problem list She has Essential hypertension; Asthma; ESOPHAGEAL STRICTURE; Incarcerated recurrent paraesophageal hiatal hernia s/p lap redo repair [id number]; Vertebral compression fracture ([place]); Herniated lumbar intervertebral disc; MGUS (monoclonal gammopathy of unknown significance); Constipation, chronic; Prediabetes; Vitamin D deficiency; Medication management; Osteoporosis; Coronary artery disease due to lipid rich plaque; Depression, major, in partial remission ([place]); Chronic pain syndrome; Mixed hyperlipidemia; GERD ; Encounter for Medicare annual wellness exam; DDD (degenerative disc disease), lumbar; Spinal stenosis, lumbar region, with neurogenic claudication; Ileus ([place]); Leukocytosis; History of lumbar spinal fusion; Spondylolisthesis, lumbar region; Anxiety state; and Costochondritis on their problem list.   Review of Systems  Constitutional: Positive for appetite change, fatigue and unexpected weight change. Negative for activity change, chills, diaphoresis and fever.  HENT: Negative.   Respiratory: Negative.   Cardiovascular: Negative.   Gastrointestinal: Positive for abdominal distention, abdominal pain (with food) and nausea. Negative for anal bleeding, blood in stool, constipation, diarrhea, rectal pain and vomiting.  Genitourinary: Negative.   Musculoskeletal: Negative.   Neurological: Negative.        Objective:   Physical Exam  Constitutional: She is oriented to person, place, and time. She appears well-developed. No distress.  HENT:  Head: Normocephalic and atraumatic.  Right Ear: External ear normal.   Left Ear: External ear normal.  Nose: Nose normal.  Mouth/Throat: Oropharynx is clear and moist. No oropharyngeal exudate.  Eyes: Conjunctivae and EOM are normal.  Neck: Normal range of motion. Neck supple. No JVD present. No thyromegaly present.  Cardiovascular: Normal rate, regular rhythm, normal heart sounds and intact distal pulses.  Pulmonary/Chest: Effort normal and breath sounds normal.  Abdominal: Soft. She exhibits distension. She exhibits no mass. Bowel sounds are decreased. There is generalized tenderness. There is no rigidity, no rebound, no guarding, no tenderness at McBurney's point and negative Murphy's sign. No hernia.  Musculoskeletal: Normal range of motion. She exhibits no edema or tenderness.  Lymphadenopathy:    She has no cervical adenopathy.  Neurological: She is alert and oriented to person, place, and time. No cranial nerve deficit.  Skin: Skin is warm and dry. No rash noted. No erythema. No pallor.  Psychiatric: She has a normal mood and affect. Her behavior is normal. Judgment and thought content normal.  Nursing note and vitals reviewed.      Assessment & Plan:   AB pain  with N/V worse with food with weight loss, has had multiple AB surgeries No rebound, still passing gas Get CTA rule out ischemia with history ? Oxycodone contributing to GI issues Refer to GI for evaluation Stop fosamax/magnesium for now Start on protonix phenergan sent in  -     CT ANGIO ABDOMEN W &/OR WO CONTRAST; Future -     Ambulatory referral to Gastroenterology -     CBC with Differential/Platelet -     BASIC METABOLIC PANEL WITH GFR -     Hepatic function panel -     Lactic acid, plasma  Other orders -     promethazine (PHENERGAN) 25 MG tablet; Take 1 tablet (25 mg total) every 6 (six) hours as needed by mouth for nausea or vomiting. -     pantoprazole (PROTONIX) 40 MG tablet; Take 1 tablet (40 mg total) daily by mouth.  The patient was advised to call immediately if she  has any concerning symptoms in the interval. The patient voices understanding of current treatment options and is in agreement with the current care plan.The patient knows to call the clinic with any problems, questions or concerns or go to the ER if any further progression of symptoms.

## 2017-03-24 NOTE — Telephone Encounter (Signed)
Added to cancellation list 

## 2017-03-24 NOTE — Progress Notes (Signed)
Pt aware of lab results & voiced understanding of those results.

## 2017-03-26 ENCOUNTER — Other Ambulatory Visit (INDEPENDENT_AMBULATORY_CARE_PROVIDER_SITE_OTHER): Payer: Self-pay | Admitting: Family

## 2017-03-27 ENCOUNTER — Other Ambulatory Visit: Payer: Self-pay | Admitting: Physician Assistant

## 2017-03-27 DIAGNOSIS — R7989 Other specified abnormal findings of blood chemistry: Secondary | ICD-10-CM

## 2017-03-27 LAB — BASIC METABOLIC PANEL WITH GFR
BUN: 10 mg/dL (ref 7–25)
CHLORIDE: 105 mmol/L (ref 98–110)
CO2: 29 mmol/L (ref 20–32)
Calcium: 9.8 mg/dL (ref 8.6–10.4)
Creat: 0.91 mg/dL (ref 0.60–0.93)
GFR, EST AFRICAN AMERICAN: 70 mL/min/{1.73_m2} (ref >=60)
GFR, Est Non African American: 60 mL/min/{1.73_m2} (ref >=60)
Glucose, Bld: 111 mg/dL — ABNORMAL HIGH (ref 65–99)
Potassium: 5.4 mmol/L — ABNORMAL HIGH (ref 3.5–5.3)
Sodium: 142 mmol/L (ref 135–146)

## 2017-03-27 LAB — HEPATIC FUNCTION PANEL
AG RATIO: 1.5 (calc) (ref 1.0–2.5)
ALT: 18 U/L (ref 6–29)
AST: 15 U/L (ref 10–35)
Albumin: 4.3 g/dL (ref 3.6–5.1)
Alkaline phosphatase (APISO): 66 U/L (ref 33–130)
BILIRUBIN DIRECT: 0.2 mg/dL (ref 0.0–0.2)
BILIRUBIN INDIRECT: 0.5 mg/dL (ref 0.2–1.2)
GLOBULIN: 2.8 g/dL (ref 1.9–3.7)
Total Bilirubin: 0.7 mg/dL (ref 0.2–1.2)
Total Protein: 7.1 g/dL (ref 6.1–8.1)

## 2017-03-27 LAB — CBC WITH DIFFERENTIAL/PLATELET
BASOS ABS: 49 {cells}/uL (ref 0–200)
BASOS PCT: 0.8 %
EOS ABS: 262 {cells}/uL (ref 15–500)
Eosinophils Relative: 4.3 %
HCT: 44.9 % (ref 35.0–45.0)
Hemoglobin: 15.4 g/dL (ref 11.7–15.5)
Lymphs Abs: 1732 cells/uL (ref 850–3900)
MCH: 32.1 pg (ref 27.0–33.0)
MCHC: 34.3 g/dL (ref 32.0–36.0)
MCV: 93.5 fL (ref 80.0–100.0)
MPV: 10.8 fL (ref 7.5–12.5)
Monocytes Relative: 8.2 %
NEUTROS PCT: 58.3 %
Neutro Abs: 3556 cells/uL (ref 1500–7800)
Platelets: 355 10*3/uL (ref 140–400)
RBC: 4.8 10*6/uL (ref 3.80–5.10)
RDW: 12 % (ref 11.0–15.0)
Total Lymphocyte: 28.4 %
WBC: 6.1 10*3/uL (ref 3.8–10.8)
WBCMIX: 500 {cells}/uL (ref 200–950)

## 2017-03-27 LAB — LACTIC ACID, PLASMA: LACTIC ACID: 3.3 mmol/L — ABNORMAL HIGH (ref 0.4–1.8)

## 2017-03-28 ENCOUNTER — Telehealth: Payer: Self-pay

## 2017-03-28 NOTE — Telephone Encounter (Signed)
Pt called to report that her phenergan needed a PA. I informed the pt that we would have to file the claim w/ her insurance & PT REPORTS that she will pay cash price for the phenergan.

## 2017-03-29 NOTE — Progress Notes (Signed)
Office Visit Note  Patient: [name] [name]             Date of Birth: 12-Aug-1935           MRN: [id number]             PCP: [name], MD Referring: [name], MD Visit Date: 04/11/2017 Occupation: @GUAROCC @    Subjective:  Lower back pain   History of Present Illness: [name] [name] is a 81 y.o. female with history of osteoporosis presents today to discuss Fosamax medical management.  Patient states she has had nausea for the past 3 weeks. Patient reports her PCP instructed her to stop taking Fosamax, but she reports she did not. She states she has been on Fosamax for years.  She continues to have lower back pain. She sees Dr. Louanne Skye for her back. She continues to have some discomfort in her knee joints. She denies other joint pain or joint swelling.    Activities of Daily Living:  Patient reports morning stiffness for 0 minutes.   Patient Denies nocturnal pain.  Difficulty dressing/grooming: Denies Difficulty climbing stairs: Denies Difficulty getting out of chair: Denies Difficulty using hands for taps, buttons, cutlery, and/or writing: Reports   Review of Systems  Constitutional: Negative for fatigue and weakness.  HENT: Negative for mouth sores, mouth dryness and nose dryness.   Eyes: Negative for redness and dryness.  Respiratory: Negative for cough, hemoptysis, shortness of breath and difficulty breathing.   Cardiovascular: Negative.  Positive for hypertension. Negative for chest pain, palpitations, irregular heartbeat and swelling in legs/feet.  Gastrointestinal: Positive for constipation and nausea. Negative for blood in stool and diarrhea.  Endocrine: Negative for increased urination.  Genitourinary: Negative for painful urination.  Musculoskeletal: Positive for arthralgias and joint pain. Negative for joint swelling, myalgias, muscle weakness, morning stiffness, muscle tenderness and myalgias.  Skin: Negative for color change, pallor, rash, hair loss,  nodules/bumps, redness, skin tightness, ulcers and sensitivity to sunlight.  Neurological: Negative for dizziness, numbness and headaches.  Hematological: Negative for swollen glands.  Psychiatric/Behavioral: Positive for depressed mood. Negative for sleep disturbance. The patient is not nervous/anxious.     PMFS History:  Patient Active Problem List   Diagnosis Date Noted  . Atherosclerosis of aorta ([place]) 04/05/2017  . Costochondritis 11/14/2016  . Anxiety state 06/08/2016  . History of lumbar spinal fusion 04/13/2016  . Spondylolisthesis, lumbar region 04/13/2016  . Leukocytosis 03/28/2016  . Ileus ([place])   . Spinal stenosis, lumbar region, with neurogenic claudication 05/29/2015  . DDD (degenerative disc disease), lumbar 04/21/2015  . Mixed hyperlipidemia 11/24/2014  . GERD  11/24/2014  . Depression, major, in partial remission ([place]) 09/26/2014  . Chronic pain syndrome 09/26/2014  . Coronary artery disease due to lipid rich plaque   . Osteoporosis 02/10/2014  . Prediabetes 08/14/2013  . Vitamin D deficiency 08/14/2013  . Medication management 08/14/2013  . Constipation, chronic 04/03/2013  . MGUS (monoclonal gammopathy of unknown significance) 03/05/2013  . Herniated lumbar intervertebral disc 06/25/2012    Class: Acute  . Vertebral compression fracture ([place]) 06/23/2012  . ESOPHAGEAL STRICTURE 08/28/2008  . Asthma 07/29/2008  . Incarcerated recurrent paraesophageal hiatal hernia s/p lap redo repair [id number] 01/31/2008  . Essential hypertension 07/25/2007    Past Medical History:  Diagnosis Date  . Anxiety    takes Xanax daily as needed  . Asthma    Albuterol daily as needed  . CAD (coronary artery disease)    LHC 2003 with 40% pLAD,  30% mLAD, 100% D1 (moderate vessel), 100%  D2 (small vessel), 95% mid small OM2.  Pt had PTCA to D1;  D2 and OM2 small vessels and tx medically;  Last Myoview (2012): anterior infarct seen with scar, no ischemia. EF normal. Pt managed  medically;  Lexiscan Myoview (12/14):  Low risk; ant scar with very mild peri-infarct ischemia; EF 55% with ant AK   . Chronic back pain    HNP, DDD, spinal stenosis, vertebral compression fractures.   . Chronic nausea    takes Zofran daily as needed.  normal gastric emptying study 03/2013  . Constipation    felt to be functional. takes Senokot and Miralax daily.  treated with Linzess in past.   . DIVERTICULOSIS, COLON 08/28/2008   Qualifier: Diagnosis of  By: [name], [name], [name]    . Elevated LFTs 2015   probably med induced DILI, from Augmentin vs Pravastatin  . GERD (gastroesophageal reflux disease)    hx of esophageal stricture   . Hiatal hernia    Paraesophageal hernias. s/p fundoplications in 2376 and 04/2013  . History of bronchitis several of yrs ago  . History of colon polyps 03/2009, 03/2011   adenomatous colon polyps, no high grade dysplasia.   [name] History of vertigo    no meds  . Hyperlipidemia    takes Pravastatin daily  . Hypertension    takes Amlodipine,Metoprolol,and Losartan daily  . Nausea 03/2016  . Osteoporosis   . PONV (postoperative nausea and vomiting)    yrs ago  . Slow urinary stream    takes Rapaflo daily  . TIA (transient ischemic attack) 1995   no residual effects noted    Family History  Problem Relation Age of Onset  . Colon cancer Mother 81  . Heart disease Father 82       heart attack  . Brain cancer Brother        tumor   . Heart disease Brother   . Heart disease Brother   . Kidney disease Daughter    Past Surgical History:  Procedure Laterality Date  . APPENDECTOMY     patient unsure of date  . BACK SURGERY    . BLADDER REPAIR    . CHOLECYSTECTOMY     Patient unsure of date.  . CORONARY ANGIOPLASTY  11/16/2001   1 stent  . ESOPHAGEAL MANOMETRY N/A 03/25/2013   Procedure: ESOPHAGEAL MANOMETRY (EM);  Surgeon: Inda Castle, MD;  Location: [facility];  Service: Endoscopy;  Laterality: N/A;  . ESOPHAGOGASTRODUODENOSCOPY N/A  03/15/2013   Procedure: ESOPHAGOGASTRODUODENOSCOPY (EGD);  Surgeon: Jerene Bears, MD;  Location[facility] ENDOSCOPY;  Service: Endoscopy;  Laterality: N/A;  . ESOPHAGOGASTRODUODENOSCOPY N/A 12/25/2015   Procedure: ESOPHAGOGASTRODUODENOSCOPY (EGD);  Surgeon: Doran Stabler, MD;  Location: [facility] ENDOSCOPY;  Service: Endoscopy;  Laterality: N/A;  . EYE SURGERY  11/2000   bilateral cataracts with lens implant  . FIXATION KYPHOPLASTY LUMBAR SPINE  X 2   "L3-4"  . HEMORRHOID SURGERY    . HIATAL HERNIA REPAIR  06/03/2011   Procedure: LAPAROSCOPIC REPAIR OF HIATAL HERNIA;  Surgeon: Adin Hector, MD;  Location: [facility] ORS;  Service: General;  Laterality: N/A;  . HIATAL HERNIA REPAIR N/A 04/24/2013   Procedure: LAPAROSCOPIC  REPAIR RECURRENT PARASOPHAGEAL HIATAL HERNIA WITH FUNDOPLICATION;  Surgeon: Adin Hector, MD;  Location: [facility];  Service: General;  Laterality: N/A;  . INSERTION OF MESH N/A 04/24/2013   Procedure: INSERTION OF MESH;  Surgeon: Adin Hector, MD;  Location:  [facility] ORS;  Service: General;  Laterality: N/A;  . LAPAROSCOPIC LYSIS OF ADHESIONS N/A 04/24/2013   Procedure: LAPAROSCOPIC LYSIS OF ADHESIONS;  Surgeon: Adin Hector, MD;  Location: [facility];  Service: General;  Laterality: N/A;  . LAPAROSCOPIC NISSEN FUNDOPLICATION  7/40/8144   Procedure: LAPAROSCOPIC NISSEN FUNDOPLICATION;  Surgeon: Adin Hector, MD;  Location: [facility];  Service: General;  Laterality: N/A;  . LUMBAR FUSION  12/07/2015   Right L2-3 facetectomy, posterolateral fusion L2-3 with pedicle screws, rods, local bone graft, Vivigen cancellous chips. Fusion extended to the L1 level with pedicle screws and rods  . LUMBAR LAMINECTOMY/DECOMPRESSION MICRODISCECTOMY Right 06/26/2012   Procedure: LUMBAR LAMINECTOMY/DECOMPRESSION MICRODISCECTOMY;  Surgeon: Jessy Oto, MD;  Location: [facility];  Service: Orthopedics;  Laterality: Right;  RIGHT L4-5 MICRODISCECTOMY  . LUMBAR LAMINECTOMY/DECOMPRESSION MICRODISCECTOMY N/A 05/29/2015    Procedure: RIGHT L2-3 MICRODISCECTOMY;  Surgeon: Jessy Oto, MD;  Location: [facility];  Service: Orthopedics;  Laterality: N/A;  . TOTAL ABDOMINAL HYSTERECTOMY  1975   partial   Social History   Social History Narrative   Lives with husband.   Right-handed.   3 cups caffeine per day.     Objective: Vital Signs: BP 135/69 (BP Location: Left Arm, Patient Position: Sitting, Cuff Size: Normal)   Pulse 67   Resp 17   Ht 5\' 5"  (1.651 m)   Wt 150 lb (68 kg)   BMI 24.96 kg/m    Physical Exam  Constitutional: She is oriented to person, place, and time. She appears well-developed and well-nourished.  HENT:  Head: Normocephalic and atraumatic.  Eyes: Conjunctivae and EOM are normal.  Neck: Normal range of motion.  Cardiovascular: Normal rate, regular rhythm, normal heart sounds and intact distal pulses.  Pulmonary/Chest: Effort normal and breath sounds normal.  Abdominal: Soft. Bowel sounds are normal.  Lymphadenopathy:    She has no cervical adenopathy.  Neurological: She is alert and oriented to person, place, and time.  Skin: Skin is warm and dry. Capillary refill takes less than 2 seconds.  Psychiatric: She has a normal mood and affect. Her behavior is normal.  Nursing note and vitals reviewed.    Musculoskeletal Exam: C-spine good range of motion.  Thoracic kyphosis with limited range of motion of thoracic spine and lumbar spine.  Shoulder joints, joints and wrist joints good range of motion with no synovitis.  MCPs PIPs and DIPs good range of motion with no synovitis.  PIP and DIP synovial thickening bilaterally.  Limited ROM of hip joints.  Knee joints and ankle joints good range of motion MTPs PIPs and DIPs good range motion with no synovitis.    CDAI Exam: No CDAI exam completed.    Investigation: No additional findings. CBC Latest Ref Rng & Units 03/23/2017 03/10/2017 01/04/2017  WBC 3.8 - 10.8 Thousand/uL 6.1 8.6 7.1  Hemoglobin 11.7 - 15.5 g/dL 15.4 17.0(H) 13.9    Hematocrit 35.0 - 45.0 % 44.9 48.5(H) 41.2  Platelets 140 - 400 Thousand/uL 355 301 213   CMP Latest Ref Rng & Units 03/23/2017 03/10/2017 01/04/2017  Glucose 65 - 99 mg/dL 111(H) 163(H) 126(H)  BUN 7 - 25 mg/dL 10 12 11   Creatinine 0.60 - 0.93 mg/dL 0.91 1.03(H) 0.92  Sodium 135 - 146 mmol/L 142 133(L) 138  Potassium 3.5 - 5.3 mmol/L 5.4(H) 4.1 4.2  Chloride 98 - 110 mmol/L 105 97(L) 105  CO2 20 - 32 mmol/L 29 21(L) 19(L)  Calcium 8.6 - 10.4 mg/dL 9.8 9.8 8.7  Total  Protein 6.1 - 8.1 g/dL 7.1 8.5(H) 6.3  Total Bilirubin 0.2 - 1.2 mg/dL 0.7 1.5(H) 0.8  Alkaline Phos 38 - 126 U/L - 61 -  AST 10 - 35 U/L 15 28 19   ALT 6 - 29 U/L 18 20 17     Imaging: Ct Angio Abd/pel W/ And/or W/o  Result Date: 04/05/2017 CLINICAL DATA:  Abdominal pain, weight loss, evaluate for mesenteric ischemia. Remote left addition, appendectomy, cholecystectomy and hysterectomy EXAM: CT ANGIOGRAPHY ABDOMEN AND PELVIS WITH CONTRAST AND WITHOUT CONTRAST TECHNIQUE: Multidetector CT imaging of the abdomen and pelvis was performed using the standard protocol during bolus administration of intravenous contrast. Multiplanar reconstructed images and MIPs were obtained and reviewed to evaluate the vascular anatomy. CONTRAST:  41mL ISOVUE-370 IOPAMIDOL (ISOVUE-370) INJECTION 76% COMPARISON:  03/10/2017 FINDINGS: VASCULAR Aorta: Aortic atherosclerosis evident with mild tortuosity. Negative for aneurysm, dissection, occlusive process, or retroperitoneal hemorrhage. Celiac: Atherosclerotic origin but remains patent including its branches SMA: Widely patent. Replaced right hepatic artery noted off the proximal SMA. Renals: Widely patent main renal arteries with atherosclerotic origins. There is an accessory renal artery to the right kidney lower pole which is widely patent. IMA: Widely patent origin including its branches. Inflow: Tortuous iliac vessels. Common, internal and external iliac arteries remain patent. No iliac occlusive  disease. Proximal Outflow: Common femoral, proximal profunda femoral, and proximal superficial femoral arteries visualize are also patent. Veins: No obvious venous abnormality within the limitations of this arterial phase study. Review of the MIP images confirms the above findings. NON-VASCULAR Lower chest: No acute abnormality. Hepatobiliary: Remote cholecystectomy. Stable mild biliary prominence without obstruction. Stable small 10 mm hepatic cyst along the falciform ligament, image 21, series 6. Pancreas: Mild atrophy/ fatty replacement, unchanged. No acute process. Spleen: Normal in size without focal abnormality. Adrenals/Urinary Tract: Adrenal glands are unremarkable. Kidneys are normal, without renal calculi, focal lesion, or hydronephrosis. Bladder is unremarkable. Stomach/Bowel: Chronic changes from previous Nissen fundoplication. Negative for bowel obstruction, significant dilatation, ileus, or free air. Appendix not visualized. No acute inflammatory process in the right lower quadrant. No fluid collection or abscess. Lymphatic: No adenopathy. Reproductive: Remote hysterectomy. No adnexal mass or other pelvic abnormality. No free fluid. Other: No abdominal wall hernia or abnormality. No abdominopelvic ascites. Musculoskeletal: Bones are osteopenic. Degenerative changes of the spine. Postop changes from lumbar fusion and multilevel vertebral augmentations. No definite acute osseous finding. IMPRESSION: VASCULAR Abdominal atherosclerosis but negative for mesenteric occlusive vascular disease. Mesenteric and renal vasculature remain patent. Aortic tortuosity without aneurysm, dissection, or acute process. NON-VASCULAR Chronic postoperative findings as above. No significant interval change. No acute intra-abdominal or pelvic finding by CT. Electronically Signed   By: Jerilynn Mages.  Shick M.D.   On: 04/05/2017 16:34    Speciality Comments: No specialty comments available.    Procedures:  No procedures  performed Allergies: Ace inhibitors; Aspartame; Atorvastatin; Biaxin [clarithromycin]; Daliresp [roflumilast]; Erythromycin; and Levofloxacin     Assessment / Plan:     Visit Diagnoses: Age-related osteoporosis without current pathological fracture - Patient denies any recent fractures.  She continues to take Fosamax, but she has been experiencing nausea for 3 weeks.  DXA from 01/28/15 revealed: BMD; 0.670 T-score:-2.6.  Her DXA on 3/18 was performed on a different scanner and revealed: BMD: 0.652 T-score -2.8.  Most recent vitamin D from 01/04/17 was 55 and Calcium was 9.8 03/13/17.  She continues to take Vitamin D and Calcium. Discussed with patient applying for Forteo, which may be more effective for her.  Explained the risks,  benefits, and side effects of Forteo and also provided a handout of information about the medication.  Also, discussed Reclast as an option if Forteo is not approved.  Explained that Reclast does not cause the nausea that PO Fosamax causes.  A PTH level was drawn today. Plan: PTH, Intact and Calcium  History of vertebral compression fracture: No recent fractures.  Continues to see Dr. Louanne Skye for her back pain.    Primary osteoarthritis of both knees: Both knees are doing well with good ROM and no synovitis noted.    DDD (degenerative disc disease), lumbar: Chronic pain.  She is followed by Dr. Louanne Skye.  Facet arthropathy  H/O kyphoplasty  Other medical conditions are listed as follows:   History of hypertension  History of hypercholesterolemia  History of anxiety: Takes Xanax PRN.  History of neuropathy  History of asthma  Abnormal laboratory test result - IFE. followed by hematology   History of cholecystectomy  History of appendectomy    History of vitamin D deficiency: on 03/13/17 Vitamin D level was within normal limits at 55.  She continues to take vitamin D.   History of gastroesophageal reflux (GERD)    Orders: Orders Placed This Encounter   Procedures  . PTH, Intact and Calcium   No orders of the defined types were placed in this encounter.   Face-to-face time spent with patient was 30 minutes. Greater than 50% of time was spent in counseling and coordination of care.  Follow-Up Instructions: Return in about 6 months (around 10/10/2017) for Osteoporosis.    Note - This record has been created using [employer].  Chart creation errors have been sought, but may not always  have been located. Such creation errors do not reflect on  the standard of medical care.

## 2017-04-02 ENCOUNTER — Other Ambulatory Visit: Payer: Self-pay | Admitting: Physician Assistant

## 2017-04-03 ENCOUNTER — Encounter: Payer: Self-pay | Admitting: Internal Medicine

## 2017-04-05 ENCOUNTER — Ambulatory Visit
Admission: RE | Admit: 2017-04-05 | Discharge: 2017-04-05 | Disposition: A | Discharge status: Home / Self Care | Payer: PPO | Source: Ambulatory Visit | Attending: Physician Assistant | Admitting: Physician Assistant

## 2017-04-05 ENCOUNTER — Encounter: Payer: Self-pay | Admitting: Physician Assistant

## 2017-04-05 DIAGNOSIS — M961 Postlaminectomy syndrome, not elsewhere classified: Secondary | ICD-10-CM | POA: Diagnosis not present

## 2017-04-05 DIAGNOSIS — Z79891 Long term (current) use of opiate analgesic: Secondary | ICD-10-CM | POA: Diagnosis not present

## 2017-04-05 DIAGNOSIS — I700 Atherosclerosis of aorta: Secondary | ICD-10-CM | POA: Insufficient documentation

## 2017-04-05 DIAGNOSIS — M4726 Other spondylosis with radiculopathy, lumbar region: Secondary | ICD-10-CM | POA: Diagnosis not present

## 2017-04-05 DIAGNOSIS — G894 Chronic pain syndrome: Secondary | ICD-10-CM | POA: Diagnosis not present

## 2017-04-05 DIAGNOSIS — K551 Chronic vascular disorders of intestine: Secondary | ICD-10-CM

## 2017-04-05 DIAGNOSIS — R109 Unspecified abdominal pain: Secondary | ICD-10-CM | POA: Diagnosis not present

## 2017-04-05 MED ORDER — IOPAMIDOL (ISOVUE-370) INJECTION 76%
75.0000 mL | Freq: Once | INTRAVENOUS | Status: AC | PRN
  Administered 2017-04-05: 75 mL via INTRAVENOUS

## 2017-04-07 NOTE — Progress Notes (Signed)
Pt aware of lab results & voiced understanding of those results.

## 2017-04-08 ENCOUNTER — Other Ambulatory Visit: Payer: Self-pay | Admitting: Internal Medicine

## 2017-04-08 MED ORDER — ONDANSETRON HCL 8 MG PO TABS: ORAL_TABLET | ORAL | 0 refills | Status: AC

## 2017-04-11 ENCOUNTER — Encounter: Payer: Self-pay | Admitting: Rheumatology

## 2017-04-11 ENCOUNTER — Ambulatory Visit: Payer: PPO | Admitting: Rheumatology

## 2017-04-11 VITALS — BP 135/69 | HR 67 | Resp 17 | Ht 65.0 in | Wt 150.0 lb

## 2017-04-11 DIAGNOSIS — Z8679 Personal history of other diseases of the circulatory system: Secondary | ICD-10-CM | POA: Diagnosis not present

## 2017-04-11 DIAGNOSIS — Z8719 Personal history of other diseases of the digestive system: Secondary | ICD-10-CM

## 2017-04-11 DIAGNOSIS — M5136 Other intervertebral disc degeneration, lumbar region: Secondary | ICD-10-CM

## 2017-04-11 DIAGNOSIS — Z9889 Other specified postprocedural states: Secondary | ICD-10-CM

## 2017-04-11 DIAGNOSIS — R899 Unspecified abnormal finding in specimens from other organs, systems and tissues: Secondary | ICD-10-CM

## 2017-04-11 DIAGNOSIS — Z8659 Personal history of other mental and behavioral disorders: Secondary | ICD-10-CM | POA: Diagnosis not present

## 2017-04-11 DIAGNOSIS — Z9049 Acquired absence of other specified parts of digestive tract: Secondary | ICD-10-CM

## 2017-04-11 DIAGNOSIS — M47819 Spondylosis without myelopathy or radiculopathy, site unspecified: Secondary | ICD-10-CM

## 2017-04-11 DIAGNOSIS — Z8781 Personal history of (healed) traumatic fracture: Secondary | ICD-10-CM

## 2017-04-11 DIAGNOSIS — M810 Age-related osteoporosis without current pathological fracture: Secondary | ICD-10-CM | POA: Diagnosis not present

## 2017-04-11 DIAGNOSIS — Z8639 Personal history of other endocrine, nutritional and metabolic disease: Secondary | ICD-10-CM

## 2017-04-11 DIAGNOSIS — Z8669 Personal history of other diseases of the nervous system and sense organs: Secondary | ICD-10-CM

## 2017-04-11 DIAGNOSIS — Z8709 Personal history of other diseases of the respiratory system: Secondary | ICD-10-CM

## 2017-04-11 DIAGNOSIS — M170 Bilateral primary osteoarthritis of knee: Secondary | ICD-10-CM | POA: Diagnosis not present

## 2017-04-11 DIAGNOSIS — M51369 Other intervertebral disc degeneration, lumbar region without mention of lumbar back pain or lower extremity pain: Secondary | ICD-10-CM

## 2017-04-11 NOTE — Progress Notes (Signed)
FOLLOW UP  Assessment and Plan:   Hypertension Well controlled with current medications  Monitor blood pressure at home; patient to call if consistently greater than 130/80 Continue DASH diet.   Reminder to go to the ER if any CP, SOB, nausea, dizziness, severe HA, changes vision/speech, left arm numbness and tingling and jaw pain.  Cholesterol Continue medication Continue low cholesterol diet and exercise.  Check lipid panel.   Prediabetes Continue diet and exercise.  Perform daily foot/skin check, notify office of any concerning changes.  Check A1C  MGUS Monitoring closely; check CBC  Vitamin D Def/ osteoporosis prevention Continue supplementation Check Vit D level  Constipation, chronic Currently managed by daily   Atherosclerosis of aorta (HCC) Continue close monitoring of BP, cholesterol, blood sugar; Continue ASA  Continue diet and meds as discussed. Further disposition pending results of labs. Discussed med's effects and SE's.   Over 30 minutes of exam, counseling, chart review, and critical decision making was performed.   Future Appointments  Date Time Provider [place]  04/27/2017  9:45 AM Jessy Oto, MD [facility] None  05/16/2017  9:45 AM Armbruster, Carlota Raspberry, MD [facility]  07/25/2017 10:00 AM [name], MD [facility] None  10/10/2017 10:45 AM [name], MD [facility] None    ----------------------------------------------------------------------------------------------------------------------  HPI 81 y.o. female  presents for 3 month follow up on hypertension, cholesterol, prediabetes, MGUS and vitamin D deficiency. She is also followed closely for chronic constipation with hx of ileus and recent evaluation both; she reports ongoing nausea x 3 months; she has a negative abdominal angio CT from 04/05/2017 which was reviewed. She is s/p Lap Nissen Fundoplication as of Jan 7253. She reports she was has not been taking prescribed  protonix; discussed concerns with her and she will initiate this medication. She reports promethazine has been working to improve nausea; she is also prescribed zofran if needed. She has a referral in place with GI to be seen 05/16/2017.   BMI is Body mass index is 25.29 kg/m., she has not been working on diet and exercise. Wt Readings from Last 3 Encounters:  04/12/17 152 lb (68.9 kg)  04/11/17 150 lb (68 kg)  03/23/17 147 lb (66.7 kg)   Her blood pressure has been controlled at home, today their BP is BP: 134/78  She does workout. She denies chest pain, shortness of breath, dizziness.   She is on cholesterol medication and denies myalgias. Her cholesterol is at goal. The cholesterol last visit was:   Lab Results  Component Value Date   CHOL 124 01/04/2017   HDL 68 01/04/2017   LDLCALC 51 09/30/2016   TRIG 135 01/04/2017   CHOLHDL 1.8 01/04/2017    She has not been working on diet and exercise for prediabetes, and denies increased appetite, paresthesia of the feet, polydipsia, polyuria, visual disturbances and vomiting. Last A1C in the office was:  Lab Results  Component Value Date   HGBA1C 5.6 01/04/2017   Patient is on Vitamin D supplement but remained below goal at the last visit:    Lab Results  Component Value Date   VD25OH 55 01/04/2017        Current Medications:  Current Outpatient Medications on File Prior to Visit  Medication Sig  . acetaminophen (TYLENOL) 325 MG tablet Take 2 tablets (650 mg total) by mouth every 6 (six) hours as needed for mild pain (or Fever >/= 101).  [name] albuterol (PROAIR HFA) 108 (90 Base) MCG/ACT inhaler Inhale 2 puffs into the  lungs every 6 (six) hours as needed for wheezing or shortness of breath.  . ALPRAZolam (XANAX) 0.5 MG tablet TAKE 1/2 TO 1 (ONE-HALF TO ONE) TABLET BY MOUTH TWICE DAILY TO THREE TIMES DAILY AS NEEDED FOR  NERVES  . amLODipine (NORVASC) 10 MG tablet Take 1 tablet (10 mg total) by mouth at bedtime. (Patient taking  differently: Take 5 mg by mouth at bedtime. )  . Cholecalciferol (VITAMIN D3) 2000 UNITS TABS Take 6,000 Units by mouth daily.   . diclofenac sodium (VOLTAREN) 1 % GEL Apply 4 g topically 4 (four) times daily.  [name] gabapentin (NEURONTIN) 300 MG capsule Take 300 mg by mouth 3 (three) times daily.  [name] ipratropium-albuterol (DUONEB) 0.5-2.5 (3) MG/3ML SOLN USE ONE VIAL IN NEBULIZER 4 TIMES DAILY (Patient taking differently: as needed. USE ONE VIAL IN NEBULIZER 4 TIMES DAILY)  . ketoconazole (NIZORAL) 2 % cream APPLY 1 APPLICATION TOPICALLY TWO TIMES DAILY  . losartan (COZAAR) 100 MG tablet TAKE ONE TABLET BY MOUTH ONCE DAILY ( DISCONTINUE BENICAR)  . meclizine (ANTIVERT) 25 MG tablet 1/2-1 pill up to 3 times daily for motion sickness/dizziness  . metoprolol tartrate (LOPRESSOR) 50 MG tablet TAKE ONE TABLET BY MOUTH TWICE DAILY FOR  BLOOD  PRESSURE  . mometasone-formoterol (DULERA) 200-5 MCG/ACT AERO Inhale 2 puffs into the lungs 2 (two) times daily.  . nitroGLYCERIN (NITROSTAT) 0.4 MG SL tablet Place 1 tablet (0.4 mg total) under the tongue every 5 (five) minutes as needed for chest pain.  [name] ondansetron (ZOFRAN) 8 MG tablet Take 1 tablet 2 to 3 x / day only if needed for severe nausea  . OVER THE COUNTER MEDICATION Take 1,000 mg by mouth 2 (two) times daily. [place] Calcium 1000mg   . oxyCODONE-acetaminophen (PERCOCET/ROXICET) 5-325 MG tablet Take 1 tablet by mouth every 4 (four) hours as needed for severe pain.  . pantoprazole (PROTONIX) 40 MG tablet Take 1 tablet (40 mg total) daily by mouth.  . polyethylene glycol (MIRALAX / GLYCOLAX) packet Take 17 g by mouth 2 (two) times daily.  . polyethylene glycol powder (GLYCOLAX/MIRALAX) powder MIX 17 GRAMS OF POWDER IN 8 OUNCES OF LIQUID AND DRINK ONCE DAILY  . potassium chloride SA (K-DUR,KLOR-CON) 20 MEQ tablet Take 1 tablet (20 mEq total) by mouth 2 (two) times daily.  . pravastatin (PRAVACHOL) 40 MG tablet TAKE 1 TABLET BY MOUTH ONCE DAILY  . promethazine  (PHENERGAN) 25 MG tablet Take 1 tablet (25 mg total) every 6 (six) hours as needed by mouth for nausea or vomiting.  . senna-docusate (SENOKOT-S) 8.6-50 MG tablet Take 2 tablets by mouth 2 (two) times daily.  . silodosin (RAPAFLO) 8 MG CAPS capsule Take 8 mg by mouth as needed.    No current facility-administered medications on file prior to visit.      Allergies:  Allergies  Allergen Reactions  . Ace Inhibitors Cough    Per Dr Melvyn Novas pulmonology 2013  . Aspartame Diarrhea and Nausea And Vomiting  . Atorvastatin Other (See Comments)    Muscle aches  . Biaxin [Clarithromycin] Other (See Comments)    PATIENT CAN TOLERATE Z-PAK.  Is written on patient's paper chart.  [name] [Roflumilast] Nausea And Vomiting  . Erythromycin Other (See Comments)    PATIENT CAN TOLERATE Z-PAK.  It is written on patient's paper chart.  . Levofloxacin Nausea And Vomiting     Medical History:  Past Medical History:  Diagnosis Date  . Anxiety    takes Xanax daily as needed  .  Asthma    Albuterol daily as needed  . CAD (coronary artery disease)    LHC 2003 with 40% pLAD, 30% mLAD, 100% D1 (moderate vessel), 100%  D2 (small vessel), 95% mid small OM2.  Pt had PTCA to D1;  D2 and OM2 small vessels and tx medically;  Last Myoview (2012): anterior infarct seen with scar, no ischemia. EF normal. Pt managed medically;  Lexiscan Myoview (12/14):  Low risk; ant scar with very mild peri-infarct ischemia; EF 55% with ant AK   . Chronic back pain    HNP, DDD, spinal stenosis, vertebral compression fractures.   . Chronic nausea    takes Zofran daily as needed.  normal gastric emptying study 03/2013  . Constipation    felt to be functional. takes Senokot and Miralax daily.  treated with Linzess in past.   . DIVERTICULOSIS, COLON 08/28/2008   Qualifier: Diagnosis of  By: [name], [name], [name]    . Elevated LFTs 2015   probably med induced DILI, from Augmentin vs Pravastatin  . GERD (gastroesophageal reflux disease)     hx of esophageal stricture   . Hiatal hernia    Paraesophageal hernias. s/p fundoplications in 1610 and 04/2013  . History of bronchitis several of yrs ago  . History of colon polyps 03/2009, 03/2011   adenomatous colon polyps, no high grade dysplasia.   [name] History of vertigo    no meds  . Hyperlipidemia    takes Pravastatin daily  . Hypertension    takes Amlodipine,Metoprolol,and Losartan daily  . Nausea 03/2016  . Osteoporosis   . PONV (postoperative nausea and vomiting)    yrs ago  . Slow urinary stream    takes Rapaflo daily  . TIA (transient ischemic attack) 1995   no residual effects noted   Family history- Reviewed and unchanged Social history- Reviewed and unchanged   Review of Systems:  Review of Systems  Constitutional: Negative for malaise/fatigue and weight loss.  HENT: Negative for hearing loss and tinnitus.   Eyes: Negative for blurred vision and double vision.  Respiratory: Negative for cough, shortness of breath and wheezing.   Cardiovascular: Negative for chest pain, palpitations, orthopnea, claudication and leg swelling.  Gastrointestinal: Positive for nausea. Negative for abdominal pain, blood in stool, constipation, diarrhea, heartburn, melena and vomiting.  Genitourinary: Negative.   Musculoskeletal: Negative for joint pain and myalgias.  Skin: Negative for rash.  Neurological: Negative for dizziness, tingling, sensory change, weakness and headaches.  Endo/Heme/Allergies: Negative for polydipsia.  Psychiatric/Behavioral: Negative.   All other systems reviewed and are negative.   Physical Exam: BP 134/78   Pulse 68   Temp 97.9 F (36.6 C)   Ht 5\' 5"  (1.651 m)   Wt 152 lb (68.9 kg)   SpO2 96%   BMI 25.29 kg/m  Wt Readings from Last 3 Encounters:  04/12/17 152 lb (68.9 kg)  04/11/17 150 lb (68 kg)  03/23/17 147 lb (66.7 kg)   General Appearance: Well nourished, in no apparent distress. Eyes: PERRLA, EOMs, conjunctiva no swelling or  erythema Sinuses: No Frontal/maxillary tenderness ENT/Mouth: Ext aud canals clear, TMs without erythema, bulging. No erythema, swelling, or exudate on post pharynx.  Tonsils not swollen or erythematous. Hearing normal.  Neck: Supple, thyroid normal.  Respiratory: Respiratory effort normal, BS equal bilaterally without rales, rhonchi, wheezing or stridor.  Cardio: RRR with no MRGs. Brisk peripheral pulses without edema.  Abdomen: Soft, + BS.  No guarding, rebound, hernias, palpable masses, some poorly localized tenderness to LLQ  palpation Lymphatics: Non tender without lymphadenopathy.  Musculoskeletal: Full ROM, 5/5 strength, Normal gait Skin: Warm, dry without rashes, lesions, ecchymosis.  Neuro: Cranial nerves intact. No cerebellar symptoms.  Psych: Awake and oriented X 3, normal affect, Insight and Judgment appropriate.    Izora Ribas, NP 11:35 AM [facility]

## 2017-04-11 NOTE — Patient Instructions (Signed)
Teriparatide injection What is this medicine? TERIPARATIDE (terr ih PAR a tyd) increases bone mass and strength. It helps make healthy bone and to slow bone loss. This medicine is used to prevent bone fractures. This medicine may be used for other purposes; ask your health care provider or pharmacist if you have questions. COMMON BRAND NAME(S): Forteo What should I tell my health care provider before I take this medicine? They need to know if you have any of these conditions: -bone disease other than osteoporosis -high levels of calcium in the blood -history of cancer in the bone -kidney stone -Paget's disease -parathyroid disease -receiving radiation therapy -an unusual or allergic reaction to teriparatide, other medicines, foods, dyes, or preservatives -pregnant or trying to get pregnant -breast-feeding How should I use this medicine? This medicine is for injection under the skin. You will be taught how to prepare and give this medicine. Use exactly as directed. Take your medicine at regular intervals. Do not take your medicine more often than directed. It is important that you put your used needles and pens in a special sharps container. Do not put them in a trash can. If you do not have a sharps container, call your pharmacist or health care provider to get one. A special MedGuide will be given to you by the pharmacist with each prescription and refill. Be sure to read this information carefully each time. Talk to your pediatrician regarding the use of this medicine in children. Special care may be needed. Overdosage: If you think you have taken too much of this medicine contact a poison control center or emergency room at once. NOTE: This medicine is only for you. Do not share this medicine with others. What if I miss a dose? If you miss a dose, take it as soon as you can. If it is almost time for your next dose, take only that dose. Do not take double or extra doses. What may interact  with this medicine? -digoxin This list may not describe all possible interactions. Give your health care provider a list of all the medicines, herbs, non-prescription drugs, or dietary supplements you use. Also tell them if you smoke, drink alcohol, or use illegal drugs. Some items may interact with your medicine. What should I watch for while using this medicine? Visit your doctor or health care professional for regular checks on your progress. Your doctor may order blood tests and other tests to see how you are doing. You should make sure you get enough calcium and vitamin D while you are taking this medicine, unless your doctor tells you not to. Discuss the foods you eat and the vitamins you take with your health care professional. [name] may get drowsy or dizzy. Do not drive, use machinery, or do anything that needs mental alertness until you know how this medicine affects you. Do not stand or sit up quickly, especially if you are an older patient. This reduces the risk of dizzy or fainting spells. Talk to your doctor about your risk of cancer. You may be more at risk for certain types of cancers if you take this medicine. What side effects may I notice from receiving this medicine? Side effects that you should report to your doctor or health care professional as soon as possible: -allergic reactions like skin rash, itching or hives, swelling of the face, lips, or tongue -blood in the urine; pain in the lower back or side; pain when urinating -signs and symptoms of low blood pressure like dizziness;  feeling faint or lightheaded, falls; unusually weak or tired -signs and symptoms of increased calcium like nausea; vomiting; constipation; low energy; or muscle weakness Side effects that usually do not require medical attention (report these to your doctor or health care professional if they continue or are bothersome): -headache -joint pain -nausea -pain, redness, irritation or swelling at the  injection site -stomach upset This list may not describe all possible side effects. Call your doctor for medical advice about side effects. You may report side effects to FDA at 1-800-FDA-1088. Where should I keep my medicine? Keep out of the reach of children. Store the pens in a refrigerator between 2 and 8 degrees C (36 and 46 degrees F). Do not freeze. Use the pen quickly after taking out of the refrigerator and recap and return to refrigerator right after using. Protect from light. Throw away any unused medicine 28 days after the first injection from the pen. Throw away any unused medicine after the expiration date on the label. NOTE: This sheet is a summary. It may not cover all possible information. If you have questions about this medicine, talk to your doctor, pharmacist, or health care provider.  2018 Elsevier/Gold Standard (2015-09-14 10:23:57)

## 2017-04-12 ENCOUNTER — Encounter: Payer: Self-pay | Admitting: Adult Health

## 2017-04-12 ENCOUNTER — Ambulatory Visit: Payer: PPO | Admitting: Adult Health

## 2017-04-12 VITALS — BP 134/78 | HR 68 | Temp 97.9°F | Ht 65.0 in | Wt 152.0 lb

## 2017-04-12 DIAGNOSIS — Z79899 Other long term (current) drug therapy: Secondary | ICD-10-CM | POA: Diagnosis not present

## 2017-04-12 DIAGNOSIS — D472 Monoclonal gammopathy: Secondary | ICD-10-CM | POA: Diagnosis not present

## 2017-04-12 DIAGNOSIS — E559 Vitamin D deficiency, unspecified: Secondary | ICD-10-CM | POA: Diagnosis not present

## 2017-04-12 DIAGNOSIS — R7303 Prediabetes: Secondary | ICD-10-CM | POA: Diagnosis not present

## 2017-04-12 DIAGNOSIS — I700 Atherosclerosis of aorta: Secondary | ICD-10-CM | POA: Diagnosis not present

## 2017-04-12 DIAGNOSIS — K5909 Other constipation: Secondary | ICD-10-CM | POA: Diagnosis not present

## 2017-04-12 DIAGNOSIS — E782 Mixed hyperlipidemia: Secondary | ICD-10-CM | POA: Diagnosis not present

## 2017-04-12 DIAGNOSIS — I10 Essential (primary) hypertension: Secondary | ICD-10-CM | POA: Diagnosis not present

## 2017-04-12 LAB — PTH, INTACT AND CALCIUM
Calcium: 9.9 mg/dL (ref 8.6–10.4)
PTH: 39 pg/mL (ref 14–64)

## 2017-04-12 NOTE — Patient Instructions (Signed)
[facility] phone number - [phone] - tell them you have an appointment in January- please call you if they have a cancellation and can see you sooner.    Nausea, Adult Nausea is the feeling of an upset stomach or having to vomit. Nausea on its own is not usually a serious concern, but it may be an early sign of a more serious medical problem. As nausea gets worse, it can lead to vomiting. If vomiting develops, or if you are not able to drink enough fluids, you are at risk of becoming dehydrated. Dehydration can make you tired and thirsty, cause you to have a dry mouth, and decrease how often you urinate. Older adults and people with other diseases or a weak immune system are at higher risk for dehydration. The main goals of treating your nausea are:  To limit repeated nausea episodes.  To prevent vomiting and dehydration.  Follow these instructions at home: Follow instructions from your health care provider about how to care for yourself at home. Eating and drinking Follow these recommendations as told by your health care provider:  Take an oral rehydration solution (ORS). This is a drink that is sold at pharmacies and retail stores.  Drink clear fluids in small amounts as you are able. Clear fluids include water, ice chips, diluted fruit juice, and low-calorie sports drinks.  Eat bland, easy-to-digest foods in small amounts as you are able. These foods include bananas, applesauce, rice, lean meats, toast, and crackers.  Avoid drinking fluids that contain a lot of sugar or caffeine, such as energy drinks, sports drinks, and soda.  Avoid alcohol.  Avoid spicy or fatty foods.  General instructions  Drink enough fluid to keep your urine clear or pale yellow.  Wash your hands often. If soap and water are not available, use hand sanitizer.  Make sure that all people in your household wash their hands well and often.  Rest at home while you recover.  Take over-the-counter and  prescription medicines only as told by your health care provider.  Breathe slowly and deeply when you feel nauseous.  Watch your condition for any changes.  Keep all follow-up visits as told by your health care provider. This is important. Contact a health care provider if:  You have a headache.  You have new symptoms.  Your nausea gets worse.  You have a fever.  You feel light-headed or dizzy.  You vomit.  You cannot keep fluids down. Get help right away if:  You have pain in your chest, neck, arm, or jaw.  You feel extremely weak or you faint.  You have vomit that is bright red or looks like coffee grounds.  You have bloody or black stools or stools that look like tar.  You have a severe headache, a stiff neck, or both.  You have severe pain, cramping, or bloating in your abdomen.  You have a rash.  You have difficulty breathing or are breathing very quickly.  Your heart is beating very quickly.  Your skin feels cold and clammy.  You feel confused.  You have pain when you urinate.  You have signs of dehydration, such as: ? Dark urine, very little, or no urine. ? Cracked lips. ? Dry mouth. ? Sunken eyes. ? Sleepiness. ? Weakness. These symptoms may represent a serious problem that is an emergency. Do not wait to see if the symptoms will go away. Get medical help right away. Call your local emergency services (911 in the  [place] Do not drive yourself to the hospital. This information is not intended to replace advice given to you by your health care provider. Make sure you discuss any questions you have with your health care provider. Document Released: 06/02/2004 Document Revised: 09/28/2015 Document Reviewed: 12/30/2014 Elsevier Interactive Patient Education  2017 [employer].

## 2017-04-12 NOTE — Progress Notes (Signed)
PTH and Calcium WNL

## 2017-04-13 LAB — CBC WITH DIFFERENTIAL/PLATELET
Basophils Absolute: 48 cells/uL (ref 0–200)
Basophils Relative: 0.7 %
EOS PCT: 7.1 %
Eosinophils Absolute: 483 cells/uL (ref 15–500)
HEMATOCRIT: 42.6 % (ref 35.0–45.0)
HEMOGLOBIN: 14.6 g/dL (ref 11.7–15.5)
LYMPHS ABS: 1897 {cells}/uL (ref 850–3900)
MCH: 32.4 pg (ref 27.0–33.0)
MCHC: 34.3 g/dL (ref 32.0–36.0)
MCV: 94.5 fL (ref 80.0–100.0)
MPV: 11.4 fL (ref 7.5–12.5)
Monocytes Relative: 7.9 %
NEUTROS ABS: 3835 {cells}/uL (ref 1500–7800)
NEUTROS PCT: 56.4 %
Platelets: 342 10*3/uL (ref 140–400)
RBC: 4.51 10*6/uL (ref 3.80–5.10)
RDW: 12.3 % (ref 11.0–15.0)
Total Lymphocyte: 27.9 %
WBC: 6.8 10*3/uL (ref 3.8–10.8)
WBCMIX: 537 {cells}/uL (ref 200–950)

## 2017-04-13 LAB — BASIC METABOLIC PANEL WITH GFR
BUN: 15 mg/dL (ref 7–25)
CHLORIDE: 103 mmol/L (ref 98–110)
CO2: 27 mmol/L (ref 20–32)
Calcium: 10.2 mg/dL (ref 8.6–10.4)
Creat: 0.91 mg/dL (ref 0.60–0.93)
GFR, EST AFRICAN AMERICAN: 70 mL/min/{1.73_m2} (ref >=60)
GFR, Est Non African American: 60 mL/min/{1.73_m2} (ref >=60)
Glucose, Bld: 99 mg/dL (ref 65–99)
Potassium: 5.2 mmol/L (ref 3.5–5.3)
SODIUM: 140 mmol/L (ref 135–146)

## 2017-04-13 LAB — LIPID PANEL
CHOLESTEROL: 144 mg/dL (ref <200)
HDL: 75 mg/dL (ref >50)
LDL CHOLESTEROL (CALC): 52 mg/dL
Non-HDL Cholesterol (Calc): 69 mg/dL (calc) (ref <130)
Total CHOL/HDL Ratio: 1.9 (calc) (ref <5.0)
Triglycerides: 91 mg/dL (ref <150)

## 2017-04-13 LAB — HEPATIC FUNCTION PANEL
AG Ratio: 1.6 (calc) (ref 1.0–2.5)
ALT: 17 U/L (ref 6–29)
AST: 20 U/L (ref 10–35)
Albumin: 4.2 g/dL (ref 3.6–5.1)
Alkaline phosphatase (APISO): 49 U/L (ref 33–130)
BILIRUBIN DIRECT: 0.2 mg/dL (ref 0.0–0.2)
BILIRUBIN INDIRECT: 0.5 mg/dL (ref 0.2–1.2)
GLOBULIN: 2.7 g/dL (ref 1.9–3.7)
Total Bilirubin: 0.7 mg/dL (ref 0.2–1.2)
Total Protein: 6.9 g/dL (ref 6.1–8.1)

## 2017-04-13 LAB — HEMOGLOBIN A1C
Hgb A1c MFr Bld: 5.6 % of total Hgb (ref <5.7)
Mean Plasma Glucose: 114 (calc)
eAG (mmol/L): 6.3 (calc)

## 2017-04-13 LAB — TSH: TSH: 0.91 mIU/L (ref 0.40–4.50)

## 2017-04-27 ENCOUNTER — Encounter (INDEPENDENT_AMBULATORY_CARE_PROVIDER_SITE_OTHER): Payer: Self-pay | Admitting: Specialist

## 2017-04-27 ENCOUNTER — Ambulatory Visit (INDEPENDENT_AMBULATORY_CARE_PROVIDER_SITE_OTHER): Payer: PPO | Admitting: Specialist

## 2017-04-27 ENCOUNTER — Ambulatory Visit (INDEPENDENT_AMBULATORY_CARE_PROVIDER_SITE_OTHER): Payer: PPO

## 2017-04-27 VITALS — BP 158/78 | HR 73 | Ht 65.0 in | Wt 150.0 lb

## 2017-04-27 DIAGNOSIS — G8929 Other chronic pain: Secondary | ICD-10-CM | POA: Diagnosis not present

## 2017-04-27 DIAGNOSIS — M5441 Lumbago with sciatica, right side: Secondary | ICD-10-CM

## 2017-04-27 DIAGNOSIS — M4186 Other forms of scoliosis, lumbar region: Secondary | ICD-10-CM

## 2017-04-27 DIAGNOSIS — M8000XD Age-related osteoporosis with current pathological fracture, unspecified site, subsequent encounter for fracture with routine healing: Secondary | ICD-10-CM | POA: Diagnosis not present

## 2017-04-27 NOTE — Progress Notes (Signed)
Office Visit Note   Patient: [name] [name]           Date of Birth: 1936/04/14           MRN: [id number] Visit Date: 04/27/2017              Requested by: [name], [address], [place] [address] PCP: [name], MD   Assessment & Plan: Visit Diagnoses:  1. Chronic right-sided low back pain with right-sided sciatica   2. Age-related osteoporosis with current pathological fracture with routine healing, subsequent encounter   3. Other forms of scoliosis, lumbar region     Plan: Avoid bending, stooping and avoid lifting weights greater than 10 lbs. Avoid prolong standing and walking. Avoid frequent bending and stooping  No lifting greater than 10 lbs. May use ice or moist heat for pain. Weight loss is of benefit. Handicap license is approved. Recommend holding the fosamax for one week to see if it is the source of persisting nausea and poor appetite.   Follow-Up Instructions: Return in about 3 months (around 07/26/2017).   Orders:  Orders Placed This Encounter  Procedures  . XR Lumbar Spine 2-3 Views   No orders of the defined types were placed in this encounter.     Procedures: No procedures performed   Clinical Data: No additional findings.   Subjective: Chief Complaint  Patient presents with  . Lower Back - Follow-up    81 year old female with history of lumbar degenerative spondylolisthesis L4-5, severe osteoporosis, degenerative scoliosis with right foramenal stenosis. Had a localized lumbar fusion at L2-3 with decompression and pedicle screws with cement. Previous studies suggest that there has been some looseing of hardware. Last xrays were 07/2016. She takes chronicly oxycodone 5 mg three times per day. No bowel or bladder changes, does experience constipation, taking a medication for constipation.      Review of Systems  Constitutional: Negative.   HENT: Negative.   Eyes: Negative.   Respiratory: Negative.     Cardiovascular: Negative.   Gastrointestinal: Negative.   Endocrine: Negative.   Genitourinary: Negative.   Musculoskeletal: Negative.   Skin: Negative.   Allergic/Immunologic: Negative.   Neurological: Negative.   Hematological: Negative.   Psychiatric/Behavioral: Negative.      Objective: Vital Signs: BP (!) 158/78 (BP Location: Left Arm, Patient Position: Sitting)   Pulse 73   Ht 5\' 5"  (1.651 m)   Wt 150 lb (68 kg)   BMI 24.96 kg/m   Physical Exam  Constitutional: She is oriented to person, place, and time. She appears well-developed and well-nourished.  HENT:  Head: Normocephalic and atraumatic.  Eyes: EOM are normal. Pupils are equal, round, and reactive to light.  Neck: Normal range of motion. Neck supple.  Pulmonary/Chest: Effort normal and breath sounds normal.  Abdominal: Soft. Bowel sounds are normal.  Neurological: She is alert and oriented to person, place, and time.  Skin: Skin is warm and dry.  Psychiatric: She has a normal mood and affect. Her behavior is normal. Judgment and thought content normal.    Back Exam   Tenderness  The patient is experiencing tenderness in the lumbar.  Range of Motion  Extension: abnormal  Flexion: abnormal  Lateral bend right: abnormal  Lateral bend left: abnormal  Rotation right: abnormal  Rotation left: abnormal   Muscle Strength  The patient has normal back strength. Right Quadriceps:  5/5  Left Quadriceps:  5/5  Right Hamstrings:  5/5  Left Hamstrings:  5/5   Tests  Straight leg raise right: negative Straight leg raise left: negative  Reflexes  Patellar: abnormal Achilles: normal Biceps: normal Babinski's sign: normal   Other  Toe walk: normal Heel walk: normal Sensation: decreased Gait: antalgic  Erythema: no back redness Scars: present  Comments:  Right anterior and lateral thigh pain and numbness      Specialty Comments:  No specialty comments available.  Imaging: Xr Lumbar Spine 2-3  Views  Result Date: 04/27/2017 AP and lateral flexion and extension radiographs show pedicle screws and rods fixing L1-L3 with flexion and extension 1.6 mm of motion with minimal motion across the fused segments. Overall the alignment and postion is stable with miminal loosening of hardware right L2 and L3. She has had vertebroplasy at L2 and L3 and the vertebral body of L3 has a central cleave that is unchanged from radiographs 07/08/16.     PMFS History: Patient Active Problem List   Diagnosis Date Noted  . Herniated lumbar intervertebral disc 06/25/2012    Priority: High    Class: Acute  . Atherosclerosis of aorta ([place]) 04/05/2017  . Costochondritis 11/14/2016  . Anxiety state 06/08/2016  . History of lumbar spinal fusion 04/13/2016  . Spondylolisthesis, lumbar region 04/13/2016  . Leukocytosis 03/28/2016  . Ileus ([place])   . Spinal stenosis, lumbar region, with neurogenic claudication 05/29/2015  . DDD (degenerative disc disease), lumbar 04/21/2015  . Mixed hyperlipidemia 11/24/2014  . GERD  11/24/2014  . Depression, major, in partial remission ([place]) 09/26/2014  . Chronic pain syndrome 09/26/2014  . Coronary artery disease due to lipid rich plaque   . Osteoporosis 02/10/2014  . Prediabetes 08/14/2013  . Vitamin D deficiency 08/14/2013  . Medication management 08/14/2013  . Constipation, chronic 04/03/2013  . MGUS (monoclonal gammopathy of unknown significance) 03/05/2013  . Vertebral compression fracture ([place]) 06/23/2012  . ESOPHAGEAL STRICTURE 08/28/2008  . Asthma 07/29/2008  . Incarcerated recurrent paraesophageal hiatal hernia s/p lap redo repair [id number] 01/31/2008  . Essential hypertension 07/25/2007   Past Medical History:  Diagnosis Date  . Anxiety    takes Xanax daily as needed  . Asthma    Albuterol daily as needed  . CAD (coronary artery disease)    LHC 2003 with 40% pLAD, 30% mLAD, 100% D1 (moderate vessel), 100%  D2 (small vessel), 95% mid small OM2.  Pt had  PTCA to D1;  D2 and OM2 small vessels and tx medically;  Last Myoview (2012): anterior infarct seen with scar, no ischemia. EF normal. Pt managed medically;  Lexiscan Myoview (12/14):  Low risk; ant scar with very mild peri-infarct ischemia; EF 55% with ant AK   . Chronic back pain    HNP, DDD, spinal stenosis, vertebral compression fractures.   . Chronic nausea    takes Zofran daily as needed.  normal gastric emptying study 03/2013  . Constipation    felt to be functional. takes Senokot and Miralax daily.  treated with Linzess in past.   . DIVERTICULOSIS, COLON 08/28/2008   Qualifier: Diagnosis of  By: [name], [name], [name]    . Elevated LFTs 2015   probably med induced DILI, from Augmentin vs Pravastatin  . GERD (gastroesophageal reflux disease)    hx of esophageal stricture   . Hiatal hernia    Paraesophageal hernias. s/p fundoplications in 4332 and 04/2013  . History of bronchitis several of yrs ago  . History of colon polyps 03/2009, 03/2011   adenomatous colon polyps, no high grade dysplasia.   [name]  History of vertigo    no meds  . Hyperlipidemia    takes Pravastatin daily  . Hypertension    takes Amlodipine,Metoprolol,and Losartan daily  . Nausea 03/2016  . Osteoporosis   . PONV (postoperative nausea and vomiting)    yrs ago  . Slow urinary stream    takes Rapaflo daily  . TIA (transient ischemic attack) 1995   no residual effects noted    Family History  Problem Relation Age of Onset  . Colon cancer Mother 37  . Heart disease Father 28       heart attack  . Brain cancer Brother        tumor   . Heart disease Brother   . Heart disease Brother   . Kidney disease Daughter     Past Surgical History:  Procedure Laterality Date  . APPENDECTOMY     patient unsure of date  . BACK SURGERY    . BLADDER REPAIR    . CHOLECYSTECTOMY     Patient unsure of date.  . CORONARY ANGIOPLASTY  11/16/2001   1 stent  . ESOPHAGEAL MANOMETRY N/A 03/25/2013   Procedure: ESOPHAGEAL  MANOMETRY (EM);  Surgeon: Inda Castle, MD;  Location: [facility];  Service: Endoscopy;  Laterality: N/A;  . ESOPHAGOGASTRODUODENOSCOPY N/A 03/15/2013   Procedure: ESOPHAGOGASTRODUODENOSCOPY (EGD);  Surgeon: Jerene Bears, MD;  [facility] END[facility];  Service: Endoscopy;  Laterality: N/A;  . ESOPHAGOGASTRODUODENOSCOPY N/A 12/25/2015   Procedure: ESOPHAGOGASTRODUODENOSCOPY (EGD);  Surgeon: Doran Stabler, MD;  Location: [facility] END[facility]COPY;  Service: Endoscopy;  Laterality: N/A;  . EYE SURGERY  11/2000   bilateral cataracts with lens implant  . FIXATION KYPHOPLASTY LUMBAR SPINE  X 2   "L3-4"  . HEMORRHOID SURGERY    . HIATAL HERNIA REPAIR  06/03/2011   Procedure: LAPAROSCOPIC REPAIR OF HIATAL HERNIA;  Surgeon: Adin Hector, MD;  Location: [facility];  Service: General;  Laterality: N/A;  . HIATAL HERNIA REPAIR N/A 04/24/2013   Procedure: LAPAROSCOPIC  REPAIR RECURRENT PARASOPHAGEAL HIATAL HERNIA WITH FUNDOPLICATION;  Surgeon: Adin Hector, MD;  Location: [facility];  Service: General;  Laterality: N/A;  . INSERTION OF MESH N/A 04/24/2013   Procedure: INSERTION OF MESH;  Surgeon: Adin Hector, MD;  Location: [facility];  Service: General;  Laterality: N/A;  . LAPAROSCOPIC LYSIS OF ADHESIONS N/A 04/24/2013   Procedure: LAPAROSCOPIC LYSIS OF ADHESIONS;  Surgeon: Adin Hector, MD;  Location: [facility];  Service: General;  Laterality: N/A;  . LAPAROSCOPIC NISSEN FUNDOPLICATION  5/00/9381   Procedure: LAPAROSCOPIC NISSEN FUNDOPLICATION;  Surgeon: Adin Hector, MD;  Location: [facility];  Service: General;  Laterality: N/A;  . LUMBAR FUSION  12/07/2015   Right L2-3 facetectomy, posterolateral fusion L2-3 with pedicle screws, rods, local bone graft, Vivigen cancellous chips. Fusion extended to the L1 level with pedicle screws and rods  . LUMBAR LAMINECTOMY/DECOMPRESSION MICRODISCECTOMY Right 06/26/2012   Procedure: LUMBAR LAMINECTOMY/DECOMPRESSION MICRODISCECTOMY;  Surgeon: Jessy Oto, MD;  Location: [facility];  Service: Orthopedics;  Laterality: Right;  RIGHT L4-5 MICRODISCECTOMY  . LUMBAR LAMINECTOMY/DECOMPRESSION MICRODISCECTOMY N/A 05/29/2015   Procedure: RIGHT L2-3 MICRODISCECTOMY;  Surgeon: Jessy Oto, MD;  Location: [facility];  Service: Orthopedics;  Laterality: N/A;  . TOTAL ABDOMINAL HYSTERECTOMY  1975   partial   Social History   Occupational History  . Occupation: Retired    Fish farm manager: RETIRED    Comment: worked at [employer]  . Smoking status: Never Smoker  .  Smokeless tobacco: Never Used  Substance and Sexual Activity  . Alcohol use: No  . Drug use: No  . Sexual activity: No

## 2017-04-27 NOTE — Patient Instructions (Addendum)
Avoid bending, stooping and avoid lifting weights greater than 10 lbs. Avoid prolong standing and walking. Avoid frequent bending and stooping  No lifting greater than 10 lbs. May use ice or moist heat for pain. Weight loss is of benefit. Handicap license is approved. Recommend holding the fosamax for one week to see if it is the source of persisting nausea and poor appetite.  Have her seen by Dr. Ernestina Patches for evaluation for a spinal cord stimulator evaluation.

## 2017-05-05 DIAGNOSIS — G894 Chronic pain syndrome: Secondary | ICD-10-CM | POA: Diagnosis not present

## 2017-05-05 DIAGNOSIS — M961 Postlaminectomy syndrome, not elsewhere classified: Secondary | ICD-10-CM | POA: Diagnosis not present

## 2017-05-05 DIAGNOSIS — Z79891 Long term (current) use of opiate analgesic: Secondary | ICD-10-CM | POA: Diagnosis not present

## 2017-05-05 DIAGNOSIS — M4726 Other spondylosis with radiculopathy, lumbar region: Secondary | ICD-10-CM | POA: Diagnosis not present

## 2017-05-16 ENCOUNTER — Ambulatory Visit (INDEPENDENT_AMBULATORY_CARE_PROVIDER_SITE_OTHER): Payer: PPO | Admitting: Gastroenterology

## 2017-05-16 ENCOUNTER — Encounter: Payer: Self-pay | Admitting: Gastroenterology

## 2017-05-16 VITALS — BP 146/68 | HR 80 | Ht 65.0 in | Wt 153.0 lb

## 2017-05-16 DIAGNOSIS — R110 Nausea: Secondary | ICD-10-CM | POA: Diagnosis not present

## 2017-05-16 DIAGNOSIS — F1190 Opioid use, unspecified, uncomplicated: Secondary | ICD-10-CM | POA: Diagnosis not present

## 2017-05-16 DIAGNOSIS — K5900 Constipation, unspecified: Secondary | ICD-10-CM | POA: Diagnosis not present

## 2017-05-16 DIAGNOSIS — Z86010 Personal history of colonic polyps: Secondary | ICD-10-CM | POA: Diagnosis not present

## 2017-05-16 DIAGNOSIS — Z800 Family history of malignant neoplasm of digestive organs: Secondary | ICD-10-CM

## 2017-05-16 DIAGNOSIS — R109 Unspecified abdominal pain: Secondary | ICD-10-CM

## 2017-05-16 MED ORDER — DICYCLOMINE HCL 10 MG PO CAPS: 10.0000 mg | ORAL_CAPSULE | Freq: Three times a day (TID) | ORAL | 1 refills | Status: DC | PRN

## 2017-05-16 MED ORDER — PEG-KCL-NACL-NASULF-NA ASC-C 140 G PO SOLR: 1.0000 | Freq: Once | ORAL | 0 refills | Status: AC

## 2017-05-16 MED ORDER — POLYETHYLENE GLYCOL 3350 17 GM/SCOOP PO POWD: 17.0000 g | Freq: Two times a day (BID) | ORAL | 0 refills | Status: DC

## 2017-05-16 NOTE — Patient Instructions (Addendum)
If you are age 81 or older, your body mass index should be between 23-30. Your Body mass index is 25.46 kg/m. If this is out of the aforementioned range listed, please consider follow up with your Primary Care Provider.  If you are age 35 or younger, your body mass index should be between 19-25. Your Body mass index is 25.46 kg/m. If this is out of the aformentioned range listed, please consider follow up with your Primary Care Provider.   You have been scheduled for a colonoscopy. Please follow written instructions given to you at your visit today.  Please pick up your prep supplies at the pharmacy within the next 1-3 days. If you use inhalers (even only as needed), please bring them with you on the day of your procedure. Your physician has requested that you go to www.startemmi.com and enter the access code given to you at your visit today. This web site gives a general overview about your procedure. However, you should still follow specific instructions given to you by our office regarding your preparation for the procedure.  We have sent the following medications to your pharmacy for you to pick up at your convenience: Bentyl, 68ml  (Do not drive while taking)  Increase your Miralax to twice a day.  Please decrease your use of Percocet.  Contact your Primary Care Provider to discuss coming off this medication.   Thank you for entrusting me with your care and for [facility], Dr. [place] Cellar

## 2017-05-16 NOTE — Progress Notes (Signed)
HPI :  82 y/o female with a history of remote TIA, history of paraesophageal hernias s/p repair with fundoplication, history of chronic back pains with narcotic use, presenting for a new patient visit for abdominal pain, constipation, and chronic nausea. Previously saw Dr. [name], not seen since 03/2014 at our office.   Patient states she has left-sided to left upper quadrant pain that comes and goes. She seems to think it is mostly associated with urgency to have a bowel movement. She states it is reliably improved with a bowel movement. She has roughly one bowel movement per day, sometimes is more constipated and goes less frequently. She denies any blood in her stools. She is not sure if eating makes her pain any better or worse. She does have nausea which she says comes and goes, without any clear triggers. Eating does not produce her nausea. She does not vomit. When asked about the symptoms she says these are bothering her for 1 month in entirety, however, review of chart shows she has had these symptoms for several years and has had an extensive evaluation including multiple upper endoscopies and CT scans of the abdomen and pelvis over the past few years, as well as gastric imaging study. None of which has explained her symptoms. Dr. [name] thought she had constipation predominant IBS. Her last colonoscopy in 2012 was remarkable for 2 small adenomas. She has not followed up for a surveillance colonoscopy. She previously endorse dysphagia which led to workup with endoscopy and esophageal manometry without a clear etiology other than mild dysmotility and questionable spasm. She denies any dysphagia at this time. She denies weight loss, in fact think she is gaining weight.   Her mother had colon cancer diagnosed in the late 78s. She reports Zofran has not helped her nausea in the past , she is using Phenergan as needed . On average she takes one Phenergan in the morning which controls her nausea pretty  well throughout the day. Of note she has been using chronic Percocet for the past 3 years. She states she will have anywhere from 4-6 tablets of Percocet per day. Her dosing of Protonix is currently controlling her heartburn.   Most recent workup: EGD 12/25/2015 -  Normal exam, fundoplication noted - per Dr. Loletha Carrow Esophageal manometry 08/15/2015 - intact peristalsis, normal LES relaxation, intermittent nonspecific spasm EGD 03/15/2013 - esophageal candidiasis, recurrent 4-5 cm hernia, history of Nissen with [name] lesion Colonoscopy 04/07/2011 - 2 small adenomatous polyps, internal hemorrhoids,   CT angio abdomen / pelvis - 04/05/2017 - normal CT abdomen / pelvis 03/10/2017 - mild stable CBD dilation 59mm post cholecystectomy CT abdomen pelvis 03/27/2016 - normal abdomen,  Barium swallow 12/24/15 - stricture in distal 3rd of esophagus, nonspecific dysmotility Gastric emptying study 03/2013 - normal    Past Medical History:  Diagnosis Date  . Anxiety    takes Xanax daily as needed  . Asthma    Albuterol daily as needed  . CAD (coronary artery disease)    LHC 2003 with 40% pLAD, 30% mLAD, 100% D1 (moderate vessel), 100%  D2 (small vessel), 95% mid small OM2.  Pt had PTCA to D1;  D2 and OM2 small vessels and tx medically;  Last Myoview (2012): anterior infarct seen with scar, no ischemia. EF normal. Pt managed medically;  Lexiscan Myoview (12/14):  Low risk; ant scar with very mild peri-infarct ischemia; EF 55% with ant AK   . Chronic back pain    HNP, DDD, spinal stenosis, vertebral  compression fractures.   . Chronic nausea    takes Zofran daily as needed.  normal gastric emptying study 03/2013  . Constipation    felt to be functional. takes Senokot and Miralax daily.  treated with Linzess in past.   . DIVERTICULOSIS, COLON 08/28/2008   Qualifier: Diagnosis of  By: [name], [name], [name]    . Elevated LFTs 2015   probably med induced DILI, from Augmentin vs Pravastatin  . GERD  (gastroesophageal reflux disease)    hx of esophageal stricture   . Hiatal hernia    Paraesophageal hernias. s/p fundoplications in 1017 and 04/2013  . History of bronchitis several of yrs ago  . History of colon polyps 03/2009, 03/2011   adenomatous colon polyps, no high grade dysplasia.   [name] History of vertigo    no meds  . Hyperlipidemia    takes Pravastatin daily  . Hypertension    takes Amlodipine,Metoprolol,and Losartan daily  . Nausea 03/2016  . Osteoporosis   . PONV (postoperative nausea and vomiting)    yrs ago  . Slow urinary stream    takes Rapaflo daily  . TIA (transient ischemic attack) 1995   no residual effects noted     Past Surgical History:  Procedure Laterality Date  . APPENDECTOMY     patient unsure of date  . BACK SURGERY    . BLADDER REPAIR    . CHOLECYSTECTOMY     Patient unsure of date.  . CORONARY ANGIOPLASTY  11/16/2001   1 stent  . ESOPHAGEAL MANOMETRY N/A 03/25/2013   Procedure: ESOPHAGEAL MANOMETRY (EM);  Surgeon: Inda Castle, MD;  Location: [facility];  Service: Endoscopy;  Laterality: N/A;  . ESOPHAGOGASTRODUODENOSCOPY N/A 03/15/2013   Procedure: ESOPHAGOGASTRODUODENOSCOPY (EGD);  Surgeon: Jerene Bears, MD;  Location: [facility] ENDOSCOPY;  Service: Endoscopy;  Laterality: N/A;  . ESOPHAGOGASTRODUODENOSCOPY N/A 12/25/2015   Procedure: ESOPHAGOGASTRODUODENOSCOPY (EGD);  Surgeon: Doran Stabler, MD;  Location: [facility] ENDOSCOPY;  Service: Endoscopy;  Laterality: N/A;  . EYE SURGERY  11/2000   bilateral cataracts with lens implant  . FIXATION KYPHOPLASTY LUMBAR SPINE  X 2   "L3-4"  . HEMORRHOID SURGERY    . HIATAL HERNIA REPAIR  06/03/2011   Procedure: LAPAROSCOPIC REPAIR OF HIATAL HERNIA;  Surgeon: Adin Hector, MD;  Location: [facility] ORS;  Service: General;  Laterality: N/A;  . HIATAL HERNIA REPAIR N/A 04/24/2013   Procedure: LAPAROSCOPIC  REPAIR RECURRENT PARASOPHAGEAL HIATAL HERNIA WITH FUNDOPLICATION;  Surgeon: Adin Hector, MD;  Location: [facility]  ORS;  Service: General;  Laterality: N/A;  . INSERTION OF MESH N/A 04/24/2013   Procedure: INSERTION OF MESH;  Surgeon: Adin Hector, MD;  Location: [facility];  Service: General;  Laterality: N/A;  . LAPAROSCOPIC LYSIS OF ADHESIONS N/A 04/24/2013   Procedure: LAPAROSCOPIC LYSIS OF ADHESIONS;  Surgeon: Adin Hector, MD;  Location: [facility];  Service: General;  Laterality: N/A;  . LAPAROSCOPIC NISSEN FUNDOPLICATION  09/16/2583   Procedure: LAPAROSCOPIC NISSEN FUNDOPLICATION;  Surgeon: Adin Hector, MD;  Location: [facility];  Service: General;  Laterality: N/A;  . LUMBAR FUSION  12/07/2015   Right L2-3 facetectomy, posterolateral fusion L2-3 with pedicle screws, rods, local bone graft, Vivigen cancellous chips. Fusion extended to the L1 level with pedicle screws and rods  . LUMBAR LAMINECTOMY/DECOMPRESSION MICRODISCECTOMY Right 06/26/2012   Procedure: LUMBAR LAMINECTOMY/DECOMPRESSION MICRODISCECTOMY;  Surgeon: Jessy Oto, MD;  Location: [place];  Service: Orthopedics;  Laterality: Right;  RIGHT L4-5 MICRODISCECTOMY  . LUMBAR LAMINECTOMY/DECOMPRESSION  MICRODISCECTOMY N/A 05/29/2015   Procedure: RIGHT L2-3 MICRODISCECTOMY;  Surgeon: Jessy Oto, MD;  Location: [place];  Service: Orthopedics;  Laterality: N/A;  . TOTAL ABDOMINAL HYSTERECTOMY  1975   partial   Family History  Problem Relation Age of Onset  . Colon cancer Mother 62  . Heart disease Father 89       heart attack  . Brain cancer Brother        tumor   . Heart disease Brother   . Heart disease Brother   . Kidney disease Daughter    Social History   Tobacco Use  . Smoking status: Never Smoker  . Smokeless tobacco: Never Used  Substance Use Topics  . Alcohol use: No  . Drug use: No   Current Outpatient Medications  Medication Sig Dispense Refill  . acetaminophen (TYLENOL) 325 MG tablet Take 2 tablets (650 mg total) by mouth every 6 (six) hours as needed for mild pain (or Fever >/= 101). 20 tablet 0  . albuterol (PROAIR HFA)  108 (90 Base) MCG/ACT inhaler Inhale 2 puffs into the lungs every 6 (six) hours as needed for wheezing or shortness of breath. 18 g 2  . ALPRAZolam (XANAX) 0.5 MG tablet TAKE 1/2 TO 1 (ONE-HALF TO ONE) TABLET BY MOUTH TWICE DAILY TO THREE TIMES DAILY AS NEEDED FOR  NERVES 90 tablet 1  . amLODipine (NORVASC) 10 MG tablet Take 1 tablet (10 mg total) by mouth at bedtime. (Patient taking differently: Take 5 mg by mouth at bedtime. ) 30 tablet 1  . Cholecalciferol (VITAMIN D3) 2000 UNITS TABS Take 6,000 Units by mouth daily.     . diclofenac sodium (VOLTAREN) 1 % GEL Apply 4 g topically 4 (four) times daily. 5 Tube 3  . gabapentin (NEURONTIN) 300 MG capsule Take 300 mg by mouth 3 (three) times daily.    [name] ipratropium-albuterol (DUONEB) 0.5-2.5 (3) MG/3ML SOLN USE ONE VIAL IN NEBULIZER 4 TIMES DAILY (Patient taking differently: as needed. USE ONE VIAL IN NEBULIZER 4 TIMES DAILY) 360 mL 99  . ketoconazole (NIZORAL) 2 % cream APPLY 1 APPLICATION TOPICALLY TWO TIMES DAILY 30 g 1  . losartan (COZAAR) 100 MG tablet TAKE ONE TABLET BY MOUTH ONCE DAILY ( DISCONTINUE BENICAR) 90 tablet 1  . meclizine (ANTIVERT) 25 MG tablet 1/2-1 pill up to 3 times daily for motion sickness/dizziness 60 tablet 2  . metoprolol tartrate (LOPRESSOR) 50 MG tablet TAKE ONE TABLET BY MOUTH TWICE DAILY FOR  BLOOD  PRESSURE 180 tablet 1  . mometasone-formoterol (DULERA) 200-5 MCG/ACT AERO Inhale 2 puffs into the lungs 2 (two) times daily. 13 g 3  . nitroGLYCERIN (NITROSTAT) 0.4 MG SL tablet Place 1 tablet (0.4 mg total) under the tongue every 5 (five) minutes as needed for chest pain. 90 tablet 3  . OVER THE COUNTER MEDICATION Take 1,000 mg by mouth 2 (two) times daily. Coral Calcium 1000mg     . oxyCODONE-acetaminophen (PERCOCET/ROXICET) 5-325 MG tablet Take 1 tablet by mouth every 4 (four) hours as needed for severe pain. 15 tablet 0  . pantoprazole (PROTONIX) 40 MG tablet Take 1 tablet (40 mg total) daily by mouth. 90 tablet 1  .  polyethylene glycol powder (GLYCOLAX/MIRALAX) powder MIX 17 GRAMS OF POWDER IN 8 OUNCES OF LIQUID AND DRINK ONCE DAILY 527 g 0  . pravastatin (PRAVACHOL) 40 MG tablet TAKE 1 TABLET BY MOUTH ONCE DAILY 90 tablet 1  . promethazine (PHENERGAN) 25 MG tablet Take 1 tablet (25 mg total) every  6 (six) hours as needed by mouth for nausea or vomiting. 100 tablet 1  . senna-docusate (SENOKOT-S) 8.6-50 MG tablet Take 2 tablets by mouth 2 (two) times daily. 60 tablet 1  . silodosin (RAPAFLO) 8 MG CAPS capsule Take 8 mg by mouth as needed.     . potassium chloride SA (K-DUR,KLOR-CON) 20 MEQ tablet Take 1 tablet (20 mEq total) by mouth 2 (two) times daily. (Patient not taking: Reported on 05/16/2017) 30 tablet 0   No current facility-administered medications for this visit.    Allergies  Allergen Reactions  . Ace Inhibitors Cough    Per Dr Melvyn Novas pulmonology 2013  . Aspartame Diarrhea and Nausea And Vomiting  . Atorvastatin Other (See Comments)    Muscle aches  . Biaxin [Clarithromycin] Other (See Comments)    PATIENT CAN TOLERATE Z-PAK.  Is written on patient's paper chart.  [name] [Roflumilast] Nausea And Vomiting  . Erythromycin Other (See Comments)    PATIENT CAN TOLERATE Z-PAK.  It is written on patient's paper chart.  . Levofloxacin Nausea And Vomiting     Review of Systems: All systems reviewed and negative except where noted in HPI.   Lab Results  Component Value Date   WBC 6.8 04/12/2017   HGB 14.6 04/12/2017   HCT 42.6 04/12/2017   MCV 94.5 04/12/2017   PLT 342 04/12/2017    Lab Results  Component Value Date   CREATININE 0.91 04/12/2017   BUN 15 04/12/2017   NA 140 04/12/2017   K 5.2 04/12/2017   CL 103 04/12/2017   CO2 27 04/12/2017    Lab Results  Component Value Date   ALT 17 04/12/2017   AST 20 04/12/2017   ALKPHOS 61 03/10/2017   BILITOT 0.7 04/12/2017     Physical Exam: BP (!) 146/68   Pulse 80   Ht 5\' 5"  (1.651 m)   Wt 153 lb (69.4 kg)   BMI 25.46 kg/m   Constitutional: Pleasant, female in no acute distress. HEENT: Normocephalic and atraumatic. Conjunctivae are normal. No scleral icterus. Neck supple.  Cardiovascular: Normal rate, regular rhythm.  Pulmonary/chest: Effort normal and breath sounds normal. No wheezing, rales or rhonchi. Abdominal: Soft, nondistended, mild left sided mid to lower abdominal TTP, There are no masses palpable. No hepatomegaly. Extremities: no edema Lymphadenopathy: No cervical adenopathy noted. Neurological: Alert and oriented to person place and time. Skin: Skin is warm and dry. No rashes noted. Psychiatric: Normal mood and affect. Behavior is normal.   ASSESSMENT AND PLAN: 82 year old female here to establish her GI care with me and discuss the following issues:  Left-sided abdominal pain / constipation - these issues appear to be going on for several years based on review of the chart. She has had an extensive evaluation with numerous CT scans over the past few years, noting a CT angiogram, none of which failed to show pathology to cause her symptoms. She has mild constipation at baseline and endorses pain reliably associated with the urge to have bowel movement, and relieved with a bowel movement. I think her symptoms may be due to IBS-C. Recommend she increase her MiraLAX dosing to twice daily and see if this helps. I also offered her a trial of low-dose Bentyl to use as needed to see if this helps. She should use this with caution when taking narcotics and Phenergan. Otherwise as below, given her family history of colon cancer and her personal history of adenomas, she is due for surveillance colonoscopy. Her other medical  problems are stable, we discussed if she wanted to have any further surveillance colonoscopies. After discussion of risks / benefits of colonoscopy and anesthesia, In light of her pain, she did want to proceed. Further recommendations pending the results.   Chronic nausea / chronic narcotic use -  reviewed patient's prior workup, no pathology noted on EGD or CT scans, labs stable. I suspect her chronic narcotic use is driving the chronic nausea. I recommend she try to minimize narcotic use and eventually stop them if possible, as she ages the risks of narcotics go up and increased risk of falls etc. she will talk with her primary care about this issue. Otherwise can continue low-dose Phenergan when necessary if this works, she declined Zofran as it hasn't helped in the past.  History of colon adenomas - as above, patient wishes to proceed with surveillance colonoscopy.   Meigs Cellar, MD [facility] Pager [phone]  CC: Vicie Mutters, PA-C

## 2017-05-17 ENCOUNTER — Telehealth: Payer: Self-pay | Admitting: Gastroenterology

## 2017-05-17 ENCOUNTER — Ambulatory Visit (AMBULATORY_SURGERY_CENTER): Payer: PPO | Admitting: Gastroenterology

## 2017-05-17 ENCOUNTER — Telehealth: Payer: Self-pay

## 2017-05-17 ENCOUNTER — Encounter: Payer: Self-pay | Admitting: Gastroenterology

## 2017-05-17 ENCOUNTER — Other Ambulatory Visit: Payer: Self-pay

## 2017-05-17 ENCOUNTER — Emergency Department (HOSPITAL_COMMUNITY): Admission: EM | Admit: 2017-05-17 | Discharge: 2017-05-17 | Discharge status: Against Medical Advice | Payer: PPO

## 2017-05-17 VITALS — BP 149/87 | HR 64 | Temp 97.3°F | Resp 25 | Ht 65.0 in | Wt 153.0 lb

## 2017-05-17 DIAGNOSIS — D120 Benign neoplasm of cecum: Secondary | ICD-10-CM | POA: Diagnosis not present

## 2017-05-17 DIAGNOSIS — Z86010 Personal history of colonic polyps: Secondary | ICD-10-CM

## 2017-05-17 DIAGNOSIS — D123 Benign neoplasm of transverse colon: Secondary | ICD-10-CM | POA: Diagnosis not present

## 2017-05-17 DIAGNOSIS — D124 Benign neoplasm of descending colon: Secondary | ICD-10-CM

## 2017-05-17 DIAGNOSIS — Z800 Family history of malignant neoplasm of digestive organs: Secondary | ICD-10-CM | POA: Diagnosis not present

## 2017-05-17 DIAGNOSIS — D122 Benign neoplasm of ascending colon: Secondary | ICD-10-CM

## 2017-05-17 DIAGNOSIS — R109 Unspecified abdominal pain: Secondary | ICD-10-CM | POA: Diagnosis not present

## 2017-05-17 MED ORDER — SODIUM CHLORIDE 0.9 % IV SOLN: 500.0000 mL | INTRAVENOUS | Status: DC

## 2017-05-17 NOTE — Op Note (Signed)
[place] Patient Name: [name] [name] Procedure Date: 05/17/2017 10:54 AM MRN: [id number] Endoscopist: Remo Lipps P. Armbruster MD, MD Age: 82 Referring MD:  Date of Birth: 1936/04/05 Gender: Female Account #: [id number] Procedure:                Colonoscopy Indications:              High risk colon cancer surveillance: Personal                            history of colonic polyps, family history of colon                            cancer, abdominal pain Medicines:                Monitored Anesthesia Care Procedure:                Pre-Anesthesia Assessment:                           - Prior to the procedure, a History and Physical                            was performed, and patient medications and                            allergies were reviewed. The patient's tolerance of                            previous anesthesia was also reviewed. The risks                            and benefits of the procedure and the sedation                            options and risks were discussed with the patient.                            All questions were answered, and informed consent                            was obtained. Prior Anticoagulants: The patient has                            taken no previous anticoagulant or antiplatelet                            agents. ASA Grade Assessment: II - A patient with                            mild systemic disease. After reviewing the risks                            and benefits, the patient was deemed in  satisfactory condition to undergo the procedure.                           After obtaining informed consent, the colonoscope                            was passed under direct vision. Throughout the                            procedure, the patient's blood pressure, pulse, and                            oxygen saturations were monitored continuously. The                            Model PCF-[id number])  scope was introduced                            through the anus and advanced to the the cecum,                            identified by appendiceal orifice and ileocecal                            valve. The colonoscopy was performed without                            difficulty. The patient tolerated the procedure                            well. The quality of the bowel preparation was                            good. The ileocecal valve, appendiceal orifice, and                            rectum were photographed. Scope In: 10:57:54 AM Scope Out: 11:24:53 AM Scope Withdrawal Time: 0 hours 21 minutes 40 seconds  Total Procedure Duration: 0 hours 26 minutes 59 seconds  Findings:                 The perianal and digital rectal examinations were                            normal.                           A 4 mm polyp was found in the cecum. The polyp was                            sessile. The polyp was removed with a cold snare.                            Resection and retrieval were complete.  Two sessile polyps were found in the ascending                            colon. The polyps were 4 to 5 mm in size. These                            polyps were removed with a cold snare. Resection                            and retrieval were complete.                           Two sessile polyps were found in the transverse                            colon. The polyps were 4 to 5 mm in size. These                            polyps were removed with a cold snare. Resection                            and retrieval were complete.                           A 6 mm polyp was found in the descending colon. The                            polyp was sessile. The polyp was removed with a                            cold snare. Resection and retrieval were complete.                           Scattered medium-mouthed diverticula were found in                            the entire  colon.                           Internal hemorrhoids were found during retroflexion.                           The colon was tortous. The exam was otherwise                            without abnormality. Complications:            No immediate complications. Estimated blood loss:                            Minimal. Estimated Blood Loss:     Estimated blood loss was minimal. Impression:               - One 4 mm polyp in the cecum, removed with a cold  snare. Resected and retrieved.                           - Two 4 to 5 mm polyps in the ascending colon,                            removed with a cold snare. Resected and retrieved.                           - Two 4 to 5 mm polyps in the transverse colon,                            removed with a cold snare. Resected and retrieved.                           - One 6 mm polyp in the descending colon, removed                            with a cold snare. Resected and retrieved.                           - Diverticulosis in the entire examined colon.                           - Internal hemorrhoids.                           - Tortous colon                           - The examination was otherwise normal.                           Overall, no pathology to account for abdominal pain                            on this exam, which I suspect is most likely due to                            IBS-C Recommendation:           - Patient has a contact number available for                            emergencies. The signs and symptoms of potential                            delayed complications were discussed with the                            patient. Return to normal activities tomorrow.                            Written discharge instructions were provided to the  patient.                           - Resume previous diet.                           - Continue present medications.                            - Await pathology results. Remo Lipps P. Armbruster MD, MD 05/17/2017 11:31:57 AM This report has been signed electronically.

## 2017-05-17 NOTE — ED Notes (Signed)
Patient left without being triaged per registration.

## 2017-05-17 NOTE — Telephone Encounter (Signed)
LMOM for pt's husband to take her to ED at [facility] and if possible call us back with more information and we would like SA know. [name]/Recovery Room

## 2017-05-17 NOTE — Progress Notes (Signed)
Patient stating she is allergic to Aspartame which causes nausea and vomiting. Patient stating the prep had Aspartame as an ingredient. Patient was nauseated , no vomiting. Patient did not call to rerport, stating she" just prayed." Informed Dr Wilma Flavin and Osvaldo Angst CRNA Patient is asymptomatic.

## 2017-05-17 NOTE — Telephone Encounter (Signed)
Spoke to [employer], [place]. Advised of this occurrence.

## 2017-05-17 NOTE — Progress Notes (Signed)
Called to room to assist during endoscopic procedure.  Patient ID and intended procedure confirmed with present staff. Received instructions for my participation in the procedure from the performing physician.  

## 2017-05-17 NOTE — Telephone Encounter (Signed)
Pt called back.  Husband and her are currently driving to [facility].  Stated, "I thought I was just going to break wind but I bleed all around the toilet".  Pt's husband was upset after he asked if this would be a separate charge and told yes that the ED was separate from her outpatient colonoscopy earlier today.  He kept shouting "Why!?".  RN explained again that it would be separate charges.  Pt agreed to come in to [facility] ED and get checked out/bloodwork, whatever ordered.  RN called Engineer, agricultural on 3rd floor and Beth agreed to pass along info to hospital MD (JP). Thurston Pounds RN/ Recovery Room

## 2017-05-17 NOTE — Telephone Encounter (Signed)
  Patient's husband called. I was not aware of their phone call previously. She had a colonoscopy today, 6 small polyps removed via cold snare. The largest was 25mm removed from descending colon. She passed some bloody liquid stool after the procedure - hard, husband says a "medium amount, did not appear to be a lot". She denies any lightheadedness or dizziness. They went to the ER to be evaluated, they are in the waiting room. She just used the bathroom and passed clear liquid (prep) from below without any blood. They are asking if they can go home. I think she probably had some blood pool from the descending polyp and passed a small amount after the procedure. She denies any pain, shortness of breath, dizziness, or lightheadedness.  They want to go from the ER, they haven't been seen yet. I think this is reasonable given she just passed contents without blood there. She had no high risk polyps removed, I think the likelihood of any significant postpolypectomy bleed is low. I did give them precautions - if any significant bleeding or if bleeding persists they will return to the ER. The live very close to [facility] hospital if they need to go there. Otherwise they will monitor, I will touch base with them in the morning. They agreed.

## 2017-05-17 NOTE — Patient Instructions (Signed)
**  Handouts given on Polyps, diverticulosis and hemorrhoids**   YOU HAD AN ENDOSCOPIC PROCEDURE TODAY: Refer to the procedure report and other information in the discharge instructions given to you for any specific questions about what was found during the examination. If this information does not answer your questions, please call [place] office at [phone] to clarify.   YOU SHOULD EXPECT: Some feelings of bloating in the abdomen. Passage of more gas than usual. Walking can help get rid of the air that was put into your GI tract during the procedure and reduce the bloating. If you had a lower endoscopy (such as a colonoscopy or flexible sigmoidoscopy) you may notice spotting of blood in your stool or on the toilet paper. Some abdominal soreness may be present for a day or two, also.  DIET: Your first meal following the procedure should be a light meal and then it is ok to progress to your normal diet. A half-sandwich or bowl of soup is an example of a good first meal. Heavy or fried foods are harder to digest and may make you feel nauseous or bloated. Drink plenty of fluids but you should avoid alcoholic beverages for 24 hours. If you had a esophageal dilation, please see attached instructions for diet.    ACTIVITY: Your care partner should take you home directly after the procedure. You should plan to take it easy, moving slowly for the rest of the day. You can resume normal activity the day after the procedure however YOU SHOULD NOT DRIVE, use power tools, machinery or perform tasks that involve climbing or major physical exertion for 24 hours (because of the sedation medicines used during the test).   SYMPTOMS TO REPORT IMMEDIATELY: A gastroenterologist can be reached at any hour. Please call [phone]  for any of the following symptoms:  Following lower endoscopy (colonoscopy, flexible sigmoidoscopy) Excessive amounts of blood in the stool  Significant tenderness, worsening of abdominal  pains  Swelling of the abdomen that is new, acute  Fever of 100 or higher    FOLLOW UP:  If any biopsies were taken you will be contacted by phone or by letter within the next 1-3 weeks. Call [phone]  if you have not heard about the biopsies in 3 weeks.  Please also call with any specific questions about appointments or follow up tests.

## 2017-05-18 ENCOUNTER — Telehealth: Payer: Self-pay | Admitting: Gastroenterology

## 2017-05-18 NOTE — Telephone Encounter (Signed)
I called the patient to follow up after my conversation with her husband yesterday. She has had no further bleeding since post-procedure. Overall doing well. I suspect she just passed a small amount of blood from descending colon polyp which pooled in the left colon. This was a small polyp removed cold snare, very low likelihood for any significant bleeding. If she has any recurrence she will contact me but I think the likelyhood of that is extremely low. She agreed. I will contact her with path results once available.

## 2017-05-18 NOTE — Telephone Encounter (Signed)
  Follow up Call-  Call back number 05/17/2017  Post procedure Call Back phone  # [phone]  Permission to leave phone message Yes  Some recent data might be hidden     Patient questions:  Do you have a fever, pain , or abdominal swelling? No.   Pain Score  0 *  Have you tolerated food without any problems? Yes.    Have you been able to return to your normal activities? No.  Do you have any questions about your discharge instructions: Diet   No  Medications  no Follow up visit  No.  Do you have questions or concerns about your Care? No.  Actions: * If pain score is 4 or above: No action needed, pain <4.

## 2017-05-23 ENCOUNTER — Encounter: Payer: Self-pay | Admitting: Gastroenterology

## 2017-05-25 ENCOUNTER — Telehealth: Payer: Self-pay | Admitting: Rheumatology

## 2017-05-25 ENCOUNTER — Telehealth: Payer: Self-pay

## 2017-05-25 NOTE — Telephone Encounter (Signed)
Was asked to submit a prior authorization for Forteo. Authorization was submitted to pts insurance via cover my meds. Will send document to scan center.   Travis Mastel, [place], CPhT 9:27 AM

## 2017-05-25 NOTE — Telephone Encounter (Signed)
Pria from [employer] options was returning your call to request prior authorization for Forteo.  Her CB# [phone]

## 2017-05-25 NOTE — Telephone Encounter (Signed)
A prior authorization for Tymlos has been submitted to pts insurance via cover my meds. Will update once we receive a response.   Stanislaus Kaltenbach, [place], CPhT 2:05 PM

## 2017-05-25 NOTE — Telephone Encounter (Signed)
Returned call to pts insurance regarding Forteo prior authorization. Spoke with Sheree who states that [name] [name] is not a preferred drug on pts plan. Can pt use formulary alternatives: Tymlos or Natpara? If not, why?   *patient has tried and failed Fosamax*   Thanks!  Wright Gravely, [place], CPhT 11:25 AM

## 2017-05-25 NOTE — Telephone Encounter (Signed)
OK for [employer]

## 2017-05-26 DIAGNOSIS — H01025 Squamous blepharitis left lower eyelid: Secondary | ICD-10-CM | POA: Diagnosis not present

## 2017-05-26 DIAGNOSIS — Z961 Presence of intraocular lens: Secondary | ICD-10-CM | POA: Diagnosis not present

## 2017-05-26 DIAGNOSIS — D3131 Benign neoplasm of right choroid: Secondary | ICD-10-CM | POA: Diagnosis not present

## 2017-05-26 DIAGNOSIS — H01024 Squamous blepharitis left upper eyelid: Secondary | ICD-10-CM | POA: Diagnosis not present

## 2017-05-26 DIAGNOSIS — H04123 Dry eye syndrome of bilateral lacrimal glands: Secondary | ICD-10-CM | POA: Diagnosis not present

## 2017-05-26 DIAGNOSIS — H01022 Squamous blepharitis right lower eyelid: Secondary | ICD-10-CM | POA: Diagnosis not present

## 2017-05-26 DIAGNOSIS — H26492 Other secondary cataract, left eye: Secondary | ICD-10-CM | POA: Diagnosis not present

## 2017-05-26 DIAGNOSIS — H01021 Squamous blepharitis right upper eyelid: Secondary | ICD-10-CM | POA: Diagnosis not present

## 2017-05-26 DIAGNOSIS — H43811 Vitreous degeneration, right eye: Secondary | ICD-10-CM | POA: Diagnosis not present

## 2017-05-26 NOTE — Telephone Encounter (Signed)
Received a fax from [employer] regarding a prior authorization approval for TYMLOS from 05/25/2017 to 05/08/2018.   Reference number:  Phone number:[id number]  Will send document to scan center.  Pt has not yet been consented on Tymlos. Insurance denied Forteo. Do we need to apply for pt assistance? (application is on Ocie Tino's desk)   Called pt to update. Left message for her to call back  .Latoya Maulding, [place], CPhT 8:41 AM

## 2017-05-31 ENCOUNTER — Telehealth: Payer: Self-pay | Admitting: Rheumatology

## 2017-05-31 NOTE — Telephone Encounter (Signed)
Returned pts call. Left voice mail.  Noelia Lenart, [place], CPhT 3:30 PM

## 2017-05-31 NOTE — Telephone Encounter (Signed)
Patient was returning a call to our office.  CB# [phone]

## 2017-06-02 NOTE — Telephone Encounter (Signed)
Tried to call pt again, left message.  [name] [name], [place], CPhT 4:03 PM

## 2017-06-05 DIAGNOSIS — G894 Chronic pain syndrome: Secondary | ICD-10-CM | POA: Diagnosis not present

## 2017-06-05 DIAGNOSIS — M961 Postlaminectomy syndrome, not elsewhere classified: Secondary | ICD-10-CM | POA: Diagnosis not present

## 2017-06-05 DIAGNOSIS — M4726 Other spondylosis with radiculopathy, lumbar region: Secondary | ICD-10-CM | POA: Diagnosis not present

## 2017-06-05 DIAGNOSIS — Z79891 Long term (current) use of opiate analgesic: Secondary | ICD-10-CM | POA: Diagnosis not present

## 2017-06-06 NOTE — Telephone Encounter (Signed)
Called pt again. She states that she is not sure that she wants to take the medication. She says her primary doctor, Dr. Melford Aase told her that he didn't want her to take the medication. She is going to call his office to see what medication she should/should not be taking according to him and call us back. She has lots of questions about Tymlos. We will need to schedule an appointment to consent pt on medication and start process. Patient voices understanding and denies any questions at this time.  Dalin Caldera, [place], CPhT 2:08 PM

## 2017-06-07 ENCOUNTER — Other Ambulatory Visit: Payer: Self-pay | Admitting: Internal Medicine

## 2017-06-15 ENCOUNTER — Other Ambulatory Visit: Payer: Self-pay | Admitting: Internal Medicine

## 2017-06-15 DIAGNOSIS — R450 Nervousness: Secondary | ICD-10-CM

## 2017-06-15 MED ORDER — ALPRAZOLAM 0.5 MG PO TABS: ORAL_TABLET | ORAL | 0 refills | Status: DC

## 2017-06-21 DIAGNOSIS — D225 Melanocytic nevi of trunk: Secondary | ICD-10-CM | POA: Diagnosis not present

## 2017-06-21 DIAGNOSIS — L570 Actinic keratosis: Secondary | ICD-10-CM | POA: Diagnosis not present

## 2017-06-21 DIAGNOSIS — L820 Inflamed seborrheic keratosis: Secondary | ICD-10-CM | POA: Diagnosis not present

## 2017-06-21 DIAGNOSIS — D1801 Hemangioma of skin and subcutaneous tissue: Secondary | ICD-10-CM | POA: Diagnosis not present

## 2017-06-22 ENCOUNTER — Other Ambulatory Visit: Payer: Self-pay | Admitting: Internal Medicine

## 2017-07-03 DIAGNOSIS — Z79891 Long term (current) use of opiate analgesic: Secondary | ICD-10-CM | POA: Diagnosis not present

## 2017-07-03 DIAGNOSIS — G894 Chronic pain syndrome: Secondary | ICD-10-CM | POA: Diagnosis not present

## 2017-07-03 DIAGNOSIS — M4726 Other spondylosis with radiculopathy, lumbar region: Secondary | ICD-10-CM | POA: Diagnosis not present

## 2017-07-03 DIAGNOSIS — M961 Postlaminectomy syndrome, not elsewhere classified: Secondary | ICD-10-CM | POA: Diagnosis not present

## 2017-07-07 ENCOUNTER — Other Ambulatory Visit: Payer: Self-pay | Admitting: Internal Medicine

## 2017-07-07 ENCOUNTER — Other Ambulatory Visit: Payer: Self-pay | Admitting: Ophthalmology

## 2017-07-07 DIAGNOSIS — H0016 Chalazion left eye, unspecified eyelid: Secondary | ICD-10-CM | POA: Diagnosis not present

## 2017-07-07 DIAGNOSIS — H0015 Chalazion left lower eyelid: Secondary | ICD-10-CM | POA: Diagnosis not present

## 2017-07-07 DIAGNOSIS — H00011 Hordeolum externum right upper eyelid: Secondary | ICD-10-CM

## 2017-07-11 ENCOUNTER — Encounter: Payer: Self-pay | Admitting: Adult Health

## 2017-07-11 ENCOUNTER — Ambulatory Visit (INDEPENDENT_AMBULATORY_CARE_PROVIDER_SITE_OTHER): Payer: PPO | Admitting: Adult Health

## 2017-07-11 VITALS — BP 148/86 | HR 70 | Temp 97.5°F | Ht 65.0 in | Wt 159.0 lb

## 2017-07-11 DIAGNOSIS — J209 Acute bronchitis, unspecified: Secondary | ICD-10-CM | POA: Diagnosis not present

## 2017-07-11 DIAGNOSIS — J45901 Unspecified asthma with (acute) exacerbation: Secondary | ICD-10-CM

## 2017-07-11 MED ORDER — BUDESONIDE-FORMOTEROL FUMARATE 160-4.5 MCG/ACT IN AERO: 1.0000 | INHALATION_SPRAY | Freq: Two times a day (BID) | RESPIRATORY_TRACT | 12 refills | Status: DC

## 2017-07-11 MED ORDER — PROMETHAZINE-DM 6.25-15 MG/5ML PO SYRP: 5.0000 mL | ORAL_SOLUTION | Freq: Four times a day (QID) | ORAL | 1 refills | Status: DC | PRN

## 2017-07-11 MED ORDER — PREDNISONE 20 MG PO TABS: ORAL_TABLET | ORAL | 0 refills | Status: DC

## 2017-07-11 MED ORDER — AZITHROMYCIN 250 MG PO TABS: ORAL_TABLET | ORAL | 1 refills | Status: AC

## 2017-07-11 NOTE — Progress Notes (Signed)
Assessment and Plan:  [name] [name] was seen today for cough.  Diagnoses and all orders for this visit:  Acute bronchitis, unspecified organism Suggested symptomatic OTC remedies. Nasal saline spray for congestion. Nasal steroids, allergy pill, oral steroids Follow up as needed, Go to the ER if any chest pain, shortness of breath, nausea, dizziness, severe HA, changes vision/speech -     predniSONE (DELTASONE) 20 MG tablet; 2 tablets daily for 3 days, 1 tablet daily for 4 days. -     azithromycin (ZITHROMAX) 250 MG tablet; Take 2 tablets (500 mg) on  Day 1,  followed by 1 tablet (250 mg) once daily on Days 2 through 5. -     promethazine-dextromethorphan (PROMETHAZINE-DM) 6.25-15 MG/5ML syrup; Take 5 mLs by mouth 4 (four) times daily as needed for cough.  Asthma with acute exacerbation, unspecified asthma severity, unspecified whether persistent Sample for dulera given; new prescription for symbicort due to cost of medication-  -     predniSONE (DELTASONE) 20 MG tablet; 2 tablets daily for 3 days, 1 tablet daily for 4 days. -     budesonide-formoterol (SYMBICORT) 160-4.5 MCG/ACT inhaler; Inhale 1 puff into the lungs 2 (two) times daily.   Further disposition pending results of labs. Discussed med's effects and SE's.   Over 15 minutes of exam, counseling, chart review, and critical decision making was performed.   Future Appointments  Date Time Provider [place]  07/25/2017 10:00 AM [name], MD [facility] None  07/26/2017 10:30 AM Jessy Oto, MD [facility] None  10/10/2017 10:45 AM [name], MD [facility] None    ------------------------------------------------------------------------------------------------------------------   HPI BP (!) 148/86   Pulse 70   Temp (!) 97.5 F (36.4 C)   Ht 5\' 5"  (1.651 m)   Wt 159 lb (72.1 kg)   SpO2 96%   BMI 26.46 kg/m   81 y.o.female presents for "cold symptoms" that began over a week ago; she reports she first noted cough and  increased sputum (green, thick, stringy). She denies temp, fever/chills, dyspnea, chest pressure, headaches, dizziness, vomiting or diarrhea, rash. Endorses mild nasal congestion and hoarseness and mild nausea.  Endorses mild wheezing.  Denies previous seasonal allergies.    She is on dulera daily for asthma but reports is expensive and has been out; reports hasn't needed to use albuterol.   Past Medical History:  Diagnosis Date  . Anxiety    takes Xanax daily as needed  . Asthma    Albuterol daily as needed  . Blood transfusion without reported diagnosis   . CAD (coronary artery disease)    LHC 2003 with 40% pLAD, 30% mLAD, 100% D1 (moderate vessel), 100%  D2 (small vessel), 95% mid small OM2.  Pt had PTCA to D1;  D2 and OM2 small vessels and tx medically;  Last Myoview (2012): anterior infarct seen with scar, no ischemia. EF normal. Pt managed medically;  Lexiscan Myoview (12/14):  Low risk; ant scar with very mild peri-infarct ischemia; EF 55% with ant AK   . Cataract   . Chronic back pain    HNP, DDD, spinal stenosis, vertebral compression fractures.   . Chronic nausea    takes Zofran daily as needed.  normal gastric emptying study 03/2013  . Constipation    felt to be functional. takes Senokot and Miralax daily.  treated with Linzess in past.   . DIVERTICULOSIS, COLON 08/28/2008   Qualifier: Diagnosis of  By: [name], [name], [name]    . Elevated LFTs 2015   probably  med induced DILI, from Augmentin vs Pravastatin  . GERD (gastroesophageal reflux disease)    hx of esophageal stricture   . Hiatal hernia    Paraesophageal hernias. s/p fundoplications in 0626 and 04/2013  . History of bronchitis several of yrs ago  . History of colon polyps 03/2009, 03/2011   adenomatous colon polyps, no high grade dysplasia.   [name] History of vertigo    no meds  . Hyperlipidemia    takes Pravastatin daily  . Hypertension    takes Amlodipine,Metoprolol,and Losartan daily  . Nausea 03/2016  .  Osteoporosis   . PONV (postoperative nausea and vomiting)    yrs ago  . Slow urinary stream    takes Rapaflo daily  . TIA (transient ischemic attack) 1995   no residual effects noted     Allergies  Allergen Reactions  . Ace Inhibitors Cough    Per Dr Melvyn Novas pulmonology 2013  . Aspartame Diarrhea and Nausea And Vomiting  . Atorvastatin Other (See Comments)    Muscle aches  . Biaxin [Clarithromycin] Other (See Comments)    PATIENT CAN TOLERATE Z-PAK.  Is written on patient's paper chart.  [name] [Roflumilast] Nausea And Vomiting  . Erythromycin Other (See Comments)    PATIENT CAN TOLERATE Z-PAK.  It is written on patient's paper chart.  . Levofloxacin Nausea And Vomiting    Current Outpatient Medications on File Prior to Visit  Medication Sig  . acetaminophen (TYLENOL) 325 MG tablet Take 2 tablets (650 mg total) by mouth every 6 (six) hours as needed for mild pain (or Fever >/= 101).  [name] albuterol (PROAIR HFA) 108 (90 Base) MCG/ACT inhaler Inhale 2 puffs into the lungs every 6 (six) hours as needed for wheezing or shortness of breath.  . ALPRAZolam (XANAX) 0.5 MG tablet Take 1/2 to 1 tablet 2 to 3 x / day ONLY if Anxiety Attack and please try to limit to 5 days /week to avoid addiction  . amLODipine (NORVASC) 10 MG tablet TAKE ONE TABLET BY MOUTH AT BEDTIME  . Cholecalciferol (VITAMIN D3) 2000 UNITS TABS Take 6,000 Units by mouth daily.   . diclofenac sodium (VOLTAREN) 1 % GEL Apply 4 g topically 4 (four) times daily.  [name] dicyclomine (BENTYL) 10 MG capsule Take 1 capsule (10 mg total) by mouth every 8 (eight) hours as needed for spasms.  [name] gabapentin (NEURONTIN) 300 MG capsule Take 300 mg by mouth 3 (three) times daily.  [name] ipratropium-albuterol (DUONEB) 0.5-2.5 (3) MG/3ML SOLN USE ONE VIAL IN NEBULIZER 4 TIMES DAILY  . ketoconazole (NIZORAL) 2 % cream APPLY 1 APPLICATION TOPICALLY TWO TIMES DAILY  . losartan (COZAAR) 100 MG tablet TAKE ONE TABLET BY MOUTH ONCE DAILY (DISCONTINUE   BENICAR)  . meclizine (ANTIVERT) 25 MG tablet 1/2-1 pill up to 3 times daily for motion sickness/dizziness  . metoprolol tartrate (LOPRESSOR) 50 MG tablet TAKE ONE TABLET BY MOUTH TWICE DAILY FOR  BLOOD  PRESSURE (Patient taking differently: TAKE ONE TABLET BY MOUTH IN THE EVENING FOR BLOOD  PRESSURE)  . mometasone-formoterol (DULERA) 200-5 MCG/ACT AERO Inhale 2 puffs into the lungs 2 (two) times daily.  . nitroGLYCERIN (NITROSTAT) 0.4 MG SL tablet Place 1 tablet (0.4 mg total) under the tongue every 5 (five) minutes as needed for chest pain.  [name] OVER THE COUNTER MEDICATION Take 1,000 mg by mouth 2 (two) times daily. [place] Calcium 1000mg   . oxyCODONE-acetaminophen (PERCOCET/ROXICET) 5-325 MG tablet Take 1 tablet by mouth every 4 (four) hours as needed for  severe pain.  . pantoprazole (PROTONIX) 40 MG tablet Take 1 tablet (40 mg total) daily by mouth.  . pravastatin (PRAVACHOL) 40 MG tablet TAKE 1 TABLET BY MOUTH ONCE DAILY  . promethazine (PHENERGAN) 25 MG tablet Take 1 tablet (25 mg total) every 6 (six) hours as needed by mouth for nausea or vomiting.  . senna-docusate (SENOKOT-S) 8.6-50 MG tablet Take 2 tablets by mouth 2 (two) times daily.  . silodosin (RAPAFLO) 8 MG CAPS capsule Take 8 mg by mouth as needed.   . neomycin-polymyxin b-dexamethasone (MAXITROL) 3.5-10000-0.1 OINT APPLY TO STYE FOUR TIMES DAILY. (Patient not taking: Reported on 07/11/2017)  . polyethylene glycol powder (GLYCOLAX/MIRALAX) powder Take 17 g by mouth 2 (two) times daily.  . potassium chloride SA (K-DUR,KLOR-CON) 20 MEQ tablet Take 1 tablet (20 mEq total) by mouth 2 (two) times daily. (Patient not taking: Reported on 07/11/2017)   No current facility-administered medications on file prior to visit.     ROS: all negative except above.   Physical Exam:  BP (!) 148/86   Pulse 70   Temp (!) 97.5 F (36.4 C)   Ht 5\' 5"  (1.651 m)   Wt 159 lb (72.1 kg)   SpO2 96%   BMI 26.46 kg/m   General Appearance: Well nourished,  in no apparent distress. Eyes: PERRLA, EOMs, conjunctiva no swelling or erythema Sinuses: No Frontal/maxillary tenderness ENT/Mouth: Ext aud canals clear, TMs without erythema, bulging. No erythema, swelling, or exudate on post pharynx.  Tonsils not swollen or erythematous. Hearing normal.  Neck: Supple, thyroid normal.  Respiratory: Respiratory effort normal, BS equal bilaterally with scattered rhonchi throughout, without rales, wheezing or stridor.  Cardio: RRR with no MRGs. Brisk peripheral pulses without edema.  Abdomen: Soft, + BS.  Non tender.. Lymphatics: Non tender without lymphadenopathy.  Musculoskeletal: Symmetrical strength, normal gait.  Skin: Warm, dry without rashes, lesions, ecchymosis.  Neuro: Cranial nerves intact. Normal muscle tone, no cerebellar symptoms. Sensation intact.  Psych: Awake and oriented X 3, normal affect, Insight and Judgment appropriate.     Izora Ribas, NP 2:58 PM [facility]

## 2017-07-11 NOTE — Patient Instructions (Signed)

## 2017-07-24 NOTE — Progress Notes (Signed)
[facility] [name], M.D.     Uvaldo Bristle. Silverio Lay, P.A.-C [name], [place] [address] [place], [place] [address] Telephone [phone] Telefax [phone] Annual Screening/Preventative Visit & Comprehensive Evaluation &  Examination     This very nice 82 y.o.  MWF presents for a Screening/Preventative Visit & comprehensive evaluation and management of multiple medical co-morbidities.  Patient has been followed for HTN, ASCAD/Stents,  HLD, Prediabetes  and Vitamin D Deficiency.  Patient has a chronic pain syndrome and is followed by Dr Nicholaus Bloom and Dr Louanne Skye.  In 2014 , she had Lumbar Disc surgery and then in Jan 2017 had lumbar decompression followed later in July 2017 a Lumbar Fusion by Dr Louanne Skye. Also in 2014, she underwent Kyphoplasty for L3/L4 Compression Fx's.  Patient has hx/o Osteoporosis with T-2.8 at last DexaBMD on 07/08/2016 and was advised to continue her Fosamax (on 2 years) .      Patient has hx/o GERD and is s/p Laparoscopic Nissen Fundoplication Rpr.  Patient is also followed by Dr Melvyn Novas for her COPD.       HTN predates since 7. Patient's BP has been controlled. In 2003, she had PCA/Stents for ACS. In 2013, she ha a negative Myoview. Patient denies any cardiac symptoms as chest pain, palpitations, shortness of breath, dizziness or ankle swelling. Today's BP is elevated at 152/86 and was rechecked at 140/80.     Patient's hyperlipidemia is controlled with diet and medications. Patient denies myalgias or other medication SE's. Last lipids were at goal: Lab Results  Component Value Date   CHOL 144 04/12/2017   HDL 75 04/12/2017   LDLCALC 52 04/12/2017   TRIG 91 04/12/2017   CHOLHDL 1.9 04/12/2017      Patient has prediabetes (A1c 6.1%/2013). While hospitalized in Nov 2016, she had Metformin started for elevated glucoses ans she later d/c'd this as an out-patient.  Since stopping  Metformin her CBG's have been running  Patient denies reactive hypoglycemic symptoms, visual blurring, diabetic polys, or paresthesias. Last A1c was Normal & at goal: Lab Results  Component Value Date   HGBA1C 5.6 04/12/2017      Finally, patient has history of Vitamin D Deficiency ("25"/2008) and last Vitamin D was at goal: Lab Results  Component Value Date   VD25OH 55 01/04/2017   Current Outpatient Medications on File Prior to Visit  Medication Sig  . acetaminophen (TYLENOL) 325 MG tablet Take 2 tablets (650 mg total) by mouth every 6 (six) hours as needed for mild pain (or Fever >/= 101).  [name] albuterol (PROAIR HFA) 108 (90 Base) MCG/ACT inhaler Inhale 2 puffs into the lungs every 6 (six) hours as needed for wheezing or shortness of breath.  . ALPRAZolam (XANAX) 0.5 MG tablet Take 1/2 to 1 tablet 2 to 3 x / day ONLY if Anxiety Attack and please try to limit to 5 days /week to avoid addiction  . amLODipine (NORVASC) 10 MG tablet TAKE ONE TABLET BY MOUTH AT BEDTIME  . budesonide-formoterol (SYMBICORT) 160-4.5 MCG/ACT inhaler Inhale 1 puff into the lungs 2 (two) times daily.  . Cholecalciferol (VITAMIN D3) 2000 UNITS TABS Take 6,000 Units by mouth daily.   . diclofenac sodium (VOLTAREN) 1 % GEL Apply 4 g topically 4 (four) times daily.  [name] dicyclomine (BENTYL) 10 MG capsule Take 1 capsule (10 mg total) by mouth every 8 (eight) hours as needed for spasms.  [name] gabapentin (NEURONTIN) 300 MG capsule Take  300 mg by mouth 3 (three) times daily.  [name] ipratropium-albuterol (DUONEB) 0.5-2.5 (3) MG/3ML SOLN USE ONE VIAL IN NEBULIZER 4 TIMES DAILY  . ketoconazole (NIZORAL) 2 % cream APPLY 1 APPLICATION TOPICALLY TWO TIMES DAILY  . losartan (COZAAR) 100 MG tablet TAKE ONE TABLET BY MOUTH ONCE DAILY (DISCONTINUE  BENICAR)  . meclizine (ANTIVERT) 25 MG tablet 1/2-1 pill up to 3 times daily for motion sickness/dizziness  . metoprolol tartrate (LOPRESSOR) 50 MG tablet TAKE ONE TABLET BY MOUTH TWICE DAILY FOR   BLOOD  PRESSURE (Patient taking differently: TAKE ONE TABLET BY MOUTH IN THE EVENING FOR BLOOD  PRESSURE)  . neomycin-polymyxin b-dexamethasone (MAXITROL) 3.5-10000-0.1 OINT APPLY TO STYE FOUR TIMES DAILY.  . nitroGLYCERIN (NITROSTAT) 0.4 MG SL tablet Place 1 tablet (0.4 mg total) under the tongue every 5 (five) minutes as needed for chest pain.  [name] OVER THE COUNTER MEDICATION Take 1,000 mg by mouth 2 (two) times daily. [place] Calcium 1000mg   . oxyCODONE-acetaminophen (PERCOCET/ROXICET) 5-325 MG tablet Take 1 tablet by mouth every 4 (four) hours as needed for severe pain.  . pantoprazole (PROTONIX) 40 MG tablet Take 1 tablet (40 mg total) daily by mouth.  . polyethylene glycol powder (GLYCOLAX/MIRALAX) powder Take 17 g by mouth 2 (two) times daily.  . potassium chloride SA (K-DUR,KLOR-CON) 20 MEQ tablet Take 1 tablet (20 mEq total) by mouth 2 (two) times daily.  . pravastatin (PRAVACHOL) 40 MG tablet TAKE 1 TABLET BY MOUTH ONCE DAILY  . promethazine (PHENERGAN) 25 MG tablet Take 1 tablet (25 mg total) every 6 (six) hours as needed by mouth for nausea or vomiting.  . senna-docusate (SENOKOT-S) 8.6-50 MG tablet Take 2 tablets by mouth 2 (two) times daily.  . silodosin (RAPAFLO) 8 MG CAPS capsule Take 8 mg by mouth as needed.    No current facility-administered medications on file prior to visit.    Allergies  Allergen Reactions  . Ace Inhibitors Cough    Per Dr Melvyn Novas pulmonology 2013  . Aspartame Diarrhea and Nausea And Vomiting  . Atorvastatin Other (See Comments)    Muscle aches  . Biaxin [Clarithromycin] Other (See Comments)    PATIENT CAN TOLERATE Z-PAK.  Is written on patient's paper chart.  [name] [Roflumilast] Nausea And Vomiting  . Erythromycin Other (See Comments)    PATIENT CAN TOLERATE Z-PAK.  It is written on patient's paper chart.  . Levofloxacin Nausea And Vomiting   Past Medical History:  Diagnosis Date  . Anxiety    takes Xanax daily as needed  . Asthma    Albuterol  daily as needed  . Blood transfusion without reported diagnosis   . CAD (coronary artery disease)    LHC 2003 with 40% pLAD, 30% mLAD, 100% D1 (moderate vessel), 100%  D2 (small vessel), 95% mid small OM2.  Pt had PTCA to D1;  D2 and OM2 small vessels and tx medically;  Last Myoview (2012): anterior infarct seen with scar, no ischemia. EF normal. Pt managed medically;  Lexiscan Myoview (12/14):  Low risk; ant scar with very mild peri-infarct ischemia; EF 55% with ant AK   . Cataract   . Chronic back pain    HNP, DDD, spinal stenosis, vertebral compression fractures.   . Chronic nausea    takes Zofran daily as needed.  normal gastric emptying study 03/2013  . Constipation    felt to be functional. takes Senokot and Miralax daily.  treated with Linzess in past.   . DIVERTICULOSIS, COLON 08/28/2008  Qualifier: Diagnosis of  By: [name], [place], [place]    . Elevated LFTs 2015   probably med induced DILI, from Augmentin vs Pravastatin  . GERD (gastroesophageal reflux disease)    hx of esophageal stricture   . Hiatal hernia    Paraesophageal hernias. s/p fundoplications in 4627 and 04/2013  . History of bronchitis several of yrs ago  . History of colon polyps 03/2009, 03/2011   adenomatous colon polyps, no high grade dysplasia.   [name] History of vertigo    no meds  . Hyperlipidemia    takes Pravastatin daily  . Hypertension    takes Amlodipine,Metoprolol,and Losartan daily  . Nausea 03/2016  . Osteoporosis   . PONV (postoperative nausea and vomiting)    yrs ago  . Slow urinary stream    takes Rapaflo daily  . TIA (transient ischemic attack) 1995   no residual effects noted   Health Maintenance  Topic Date Due  . COLONOSCOPY  05/17/2020  . TETANUS/TDAP  04/24/2024  . INFLUENZA VACCINE  Completed  . DEXA SCAN  Completed  . PNA vac Low Risk Adult  Completed   Immunization History  Administered Date(s) Administered  . DT 05/06/2014  . Influenza Split 02/07/2011  . Influenza Whole  02/09/2009, 01/19/2010, 02/13/2012  . Influenza, High Dose Seasonal PF 03/22/2013, 01/15/2014, 02/27/2015, 01/14/2016, 01/18/2017  . Pneumococcal Conjugate-13 05/06/2014  . Pneumococcal Polysaccharide-23 02/09/2011  . Pneumococcal-Unspecified 02/06/2010, 02/09/2012  . Td 05/10/2003   Last Colon - 05/17/2017  Last MGM - 11/28/2016  Past Surgical History:  Procedure Laterality Date  . APPENDECTOMY     patient unsure of date  . BACK SURGERY    . BLADDER REPAIR    . CHOLECYSTECTOMY     Patient unsure of date.  . CORONARY ANGIOPLASTY  11/16/2001   1 stent  . ESOPHAGEAL MANOMETRY N/A 03/25/2013   Procedure: ESOPHAGEAL MANOMETRY (EM);  Surgeon: Inda Castle, MD;  Location: [facility];  Service: Endoscopy;  Laterality: N/A;  . ESOPHAGOGASTRODUODENOSCOPY N/A 03/15/2013   Procedure: ESOPHAGOGASTRODUODENOSCOPY (EGD);  Surgeon: Jerene Bears, MD;  Location: [facility] ENDOSCOPY;  Service: Endoscopy;  Laterality: N/A;  . ESOPHAGOGASTRODUODENOSCOPY N/A 12/25/2015   Procedure: ESOPHAGOGASTRODUODENOSCOPY (EGD);  Surgeon: Doran Stabler, MD;  Location: [facility];  Service: Endoscopy;  Laterality: N/A;  . EYE SURGERY  11/2000   bilateral cataracts with lens implant  . FIXATION KYPHOPLASTY LUMBAR SPINE  X 2   "L3-4"  . HEMORRHOID SURGERY    . HIATAL HERNIA REPAIR  06/03/2011   Procedure: LAPAROSCOPIC REPAIR OF HIATAL HERNIA;  Surgeon: Adin Hector, MD;  Location: [facility];  Service: General;  Laterality: N/A;  . HIATAL HERNIA REPAIR N/A 04/24/2013   Procedure: LAPAROSCOPIC  REPAIR RECURRENT PARASOPHAGEAL HIATAL HERNIA WITH FUNDOPLICATION;  Surgeon: Adin Hector, MD;  Location: [facility];  Service: General;  Laterality: N/A;  . INSERTION OF MESH N/A 04/24/2013   Procedure: INSERTION OF MESH;  Surgeon: Adin Hector, MD;  Location: [facility];  Service: General;  Laterality: N/A;  . LAPAROSCOPIC LYSIS OF ADHESIONS N/A 04/24/2013   Procedure: LAPAROSCOPIC LYSIS OF ADHESIONS;  Surgeon: Adin Hector,  MD;  Location: [facility];  Service: General;  Laterality: N/A;  . LAPAROSCOPIC NISSEN FUNDOPLICATION  0/35/0093   Procedure: LAPAROSCOPIC NISSEN FUNDOPLICATION;  Surgeon: Adin Hector, MD;  Location: [facility];  Service: General;  Laterality: N/A;  . LUMBAR FUSION  12/07/2015   Right L2-3 facetectomy, posterolateral fusion L2-3 with pedicle screws, rods, local bone  graft, Vivigen cancellous chips. Fusion extended to the L1 level with pedicle screws and rods  . LUMBAR LAMINECTOMY/DECOMPRESSION MICRODISCECTOMY Right 06/26/2012   Procedure: LUMBAR LAMINECTOMY/DECOMPRESSION MICRODISCECTOMY;  Surgeon: Jessy Oto, MD;  Location: [facility];  Service: Orthopedics;  Laterality: Right;  RIGHT L4-5 MICRODISCECTOMY  . LUMBAR LAMINECTOMY/DECOMPRESSION MICRODISCECTOMY N/A 05/29/2015   Procedure: RIGHT L2-3 MICRODISCECTOMY;  Surgeon: Jessy Oto, MD;  Location: [facility];  Service: Orthopedics;  Laterality: N/A;  . TOTAL ABDOMINAL HYSTERECTOMY  1975   partial   Family History  Problem Relation Age of Onset  . Colon cancer Mother 67  . Heart disease Father 13       heart attack  . Brain cancer Brother        tumor   . Heart disease Brother   . Heart disease Brother   . Kidney disease Daughter    Social History   Tobacco Use  . Smoking status: Never Smoker  . Smokeless tobacco: Never Used  Substance Use Topics  . Alcohol use: No  . Drug use: No    ROS Constitutional: Denies fever, chills, weight loss/gain, headaches, insomnia,  night sweats, and change in appetite. Does c/o fatigue. Eyes: Denies redness, blurred vision, diplopia, discharge, itchy, watery eyes.  ENT: Denies discharge, congestion, post nasal drip, epistaxis, sore throat, earache, hearing loss, dental pain, Tinnitus, Vertigo, Sinus pain, snoring.  Cardio: Denies chest pain, palpitations, irregular heartbeat, syncope, dyspnea, diaphoresis, orthopnea, PND, claudication, edema Respiratory: denies cough, dyspnea, DOE, pleurisy, hoarseness,  laryngitis, wheezing.  Gastrointestinal: Denies dysphagia, heartburn, reflux, water brash, pain, cramps, nausea, vomiting, bloating, diarrhea, constipation, hematemesis, melena, hematochezia, jaundice, hemorrhoids Genitourinary: Denies dysuria, frequency, urgency, nocturia, hesitancy, discharge, hematuria, flank pain Breast: Breast lumps, nipple discharge, bleeding.  Musculoskeletal: Denies arthralgia, myalgia, stiffness, Jt. Swelling, pain, limp, and strain/sprain. Denies falls. Skin: Denies puritis, rash, hives, warts, acne, eczema, changing in skin lesion Neuro: No weakness, tremor, incoordination, spasms, paresthesia, pain Psychiatric: Denies confusion, memory loss, sensory loss. Denies Depression. Endocrine: Denies change in weight, skin, hair change, nocturia, and paresthesia, diabetic polys, visual blurring, hyper / hypo glycemic episodes.  Heme/Lymph: No excessive bleeding, bruising, enlarged lymph nodes.  Physical Exam  BP (!) 152/76   Pulse 60   Temp (!) 97.1 F (36.2 C)   Resp 16   Ht 5' 3.5" (1.613 m)   Wt 155 lb (70.3 kg)   BMI 27.03 kg/m   General Appearance: Well nourished, well groomed and in no apparent distress.  Eyes: PERRLA, EOMs, conjunctiva no swelling or erythema, normal fundi and vessels. Sinuses: No frontal/maxillary tenderness ENT/Mouth: EACs patent / TMs  nl. Nares clear without erythema, swelling, mucoid exudates. Oral hygiene is good. No erythema, swelling, or exudate. Tongue normal, non-obstructing. Tonsils not swollen or erythematous. Hearing normal.  Neck: Supple, thyroid not palpable. No bruits, nodes or JVD. Respiratory: Respiratory effort normal.  BS equal and clear bilateral without rales, rhonci, wheezing or stridor. Cardio: Heart sounds are normal with regular rate and rhythm and no murmurs, rubs or gallops. Peripheral pulses are normal and equal bilaterally without edema. No aortic or femoral bruits. Chest: symmetric with normal excursions and  percussion. Breasts: Symmetric, without lumps, nipple discharge, retractions, or fibrocystic changes.  Abdomen: Flat, soft with bowel sounds active. Nontender, no guarding, rebound, hernias, masses, or organomegaly.  Lymphatics: Non tender without lymphadenopathy.  Genitourinary:  Musculoskeletal: Full ROM all peripheral extremities, joint stability, 5/5 strength, and normal gait. Skin: Warm and dry without rashes, lesions, cyanosis, clubbing or  ecchymosis.  Neuro: Cranial nerves intact, reflexes equal bilaterally. Normal muscle tone, no cerebellar symptoms. Sensation intact to touch, vibratory and Monofilament to the toes bilaterally. Pysch: Alert and oriented X 3, normal affect, Insight and Judgment appropriate.   Assessment and Plan  1. Annual Preventative Screening Examination  2. Essential hypertension  - EKG 12-Lead - Urinalysis, Routine w reflex microscopic - Microalbumin / creatinine urine ratio - CBC with Differential/Platelet - BASIC METABOLIC PANEL WITH GFR - Magnesium - TSH  3. Hyperlipidemia, mixed  - EKG 12-Lead - Hepatic function panel - Lipid panel - TSH  4. Abnormal glucose  - EKG 12-Lead - Hemoglobin A1c - Insulin, random  5. Vitamin D deficiency  - VITAMIN D 25 Hydroxyl  6. Prediabetes  - EKG 12-Lead - Hemoglobin A1c - Insulin, random  7. Coronary artery disease due to lipid rich plaque  - EKG 12-Lead  8. Atherosclerosis of aorta (HCC)  - EKG 12-Lead - Lipid panel  9. Gastroesophageal reflux disease  - CBC with Differential/Platelet  10. Chronic pain syndrome   11. Screening for colorectal cancer  - POC Hemoccult Bld/Stl   12. Screening for ischemic heart disease  - EKG 12-Lead  13. Osteoporosis  - Switch from Fosamax to Actonel 150 mg q30 days  14. Medication management  - Urinalysis, Routine w reflex microscopic - Microalbumin / creatinine urine ratio - CBC with Differential/Platelet - BASIC METABOLIC PANEL WITH  GFR - Hepatic function panel - Magnesium - Lipid panel - TSH - Hemoglobin A1c - Insulin, random - VITAMIN D 25 Hydroxyl         Patient was counseled in prudent diet to achieve/maintain BMI less than 25 for weight control, BP monitoring, regular exercise and medications. Discussed med's effects and SE's. Screening labs and tests as requested with regular follow-up as recommended. Over 40 minutes of exam, counseling, chart review and high complex critical decision making was performed.

## 2017-07-24 NOTE — Patient Instructions (Signed)
Osteoporosis Osteoporosis is the thinning and loss of density in the bones. Osteoporosis makes the bones more brittle, fragile, and likely to break (fracture). Over time, osteoporosis can cause the bones to become so weak that they fracture after a simple fall. The bones most likely to fracture are the bones in the hip, wrist, and spine. What are the causes? The exact cause is not known. What increases the risk? Anyone can develop osteoporosis. You may be at greater risk if you have a family history of the condition or have poor nutrition. You may also have a higher risk if you are:  Female.  30 years old or older.  A smoker.  Not physically active.  White or Asian.  Slender.  What are the signs or symptoms? A fracture might be the first sign of the disease, especially if it results from a fall or injury that would not usually cause a bone to break. Other signs and symptoms include:  Low back and neck pain.  Stooped posture.  Height loss.  How is this diagnosed? To make a diagnosis, your health care provider may:  Take a medical history.  Perform a physical exam.  Order tests, such as: ? A bone mineral density test. ? A dual-energy X-ray absorptiometry test.  How is this treated? The goal of osteoporosis treatment is to strengthen your bones to reduce your risk of a fracture. Treatment may involve:  Making lifestyle changes, such as: ? Eating a diet rich in calcium. ? Doing weight-bearing and muscle-strengthening exercises. ? Stopping tobacco use. ? Limiting alcohol intake.  Taking medicine to slow the process of bone loss or to increase bone density.  Monitoring your levels of calcium and vitamin D.  Follow these instructions at home:  Include calcium and vitamin D in your diet. Calcium is important for bone health, and vitamin D helps the body absorb calcium.  Perform weight-bearing and muscle-strengthening exercises as directed by your health care  provider.  Do not use any tobacco products, including cigarettes, chewing tobacco, and electronic cigarettes. If you need help quitting, ask your health care provider.  Limit your alcohol intake.  Take medicines only as directed by your health care provider.  Keep all follow-up visits as directed by your health care provider. This is important.  Take precautions at home to lower your risk of falling, such as: ? Keeping rooms well lit and clutter free. ? Installing safety rails on stairs. ? Using rubber mats in the bathroom and other areas that are often wet or slippery. Get help right away if: You fall or injure yourself. This information is not intended to replace advice given to you by your health care provider. Make sure you discuss any questions you have with your health care provider. Document Released: 02/02/2005 Document Revised: 09/28/2015 Document Reviewed: 10/03/2013 Elsevier Interactive Patient Education  2018 [place]. +++++++++++++++++++++++  Preventing Osteoporosis, Adult Osteoporosis is a condition that causes the bones to get weaker. With osteoporosis, the bones become thinner, and the normal spaces in bone tissue become larger. This can make the bones weak and cause them to break more easily. People who have osteoporosis are more likely to break their wrist, spine, or hip. Even a minor accident or injury can be enough to break weak bones. Osteoporosis can occur with aging. Your body constantly replaces old bone tissue with new tissue. As you get older, you may lose bone tissue more quickly, or it may be replaced more slowly. Osteoporosis is more likely  to develop if you have poor nutrition or do not get enough calcium or vitamin D. Other lifestyle factors can also play a role. By making some diet and lifestyle changes, you can help to keep your bones healthy and help to prevent osteoporosis. What nutrition changes can be made? Nutrition plays an important role in  maintaining healthy, strong bones.  Make sure you get enough calcium every day from food or from calcium supplements. ? If you are age 51 or younger, aim to get 1,000 mg of calcium every day. ? If you are older than age 61, aim to get 1,200 mg of calcium every day.  Try to get enough vitamin D every day. ? If you are age 68 or younger, aim to get 600 international units (IU) every day. ? If you are older than age 43, aim to get 800 international units every day.  Follow a healthy diet. Eat plenty of foods that contain calcium and vitamin D. ? Calcium is in milk, cheese, yogurt, and other dairy products. Some fish and vegetables are also good sources of calcium. Many foods such as cereals and breads have had calcium added to them (are fortified). Check nutrition labels to see how much calcium is in a food or drink. ? Foods that contain vitamin D include milk, cereals, salmon, and tuna. Your body also makes vitamin D when you are out in the sun. Bare skin exposure to the sun on your face, arms, legs, or back for no more than 30 minutes a day, 2 times per week is more than enough. Beyond that, it is important to use sunblock to protect your skin from sunburn, which increases your risk for skin cancer.  What lifestyle changes can be made? Making changes in your everyday life can also play an important role in preventing osteoporosis.  Stay active and get exercise every day. Ask your health care provider what types of exercise are best for you.  Do not use any products that contain nicotine or tobacco, such as cigarettes and e-cigarettes. If you need help quitting, ask your health care provider.  Limit alcohol intake to no more than 1 drink a day for nonpregnant women and 2 drinks a day for men. One drink equals 12 oz of beer, 5 oz of wine, or 1 oz of hard liquor.  Why are these changes important? Making these nutrition and lifestyle changes can:  Help you develop and maintain healthy, strong  bones.  Prevent loss of bone mass and the problems that are caused by that loss, such as broken bones and delayed healing.  Make you feel better mentally and physically.  What can happen if changes are not made? Problems that can result from osteoporosis can be very serious. These may include:  A higher risk of broken bones that are painful and do not heal well.  Physical malformations, such as a collapsed spine or a hunched back.  Problems with movement.  Where to find support: If you need help making changes to prevent osteoporosis, talk with your health care provider. You can ask for a referral to a diet and nutrition specialist (dietitian) and a physical therapist. Where to find more information: Learn more about osteoporosis from:  NIH Osteoporosis and Related [place]: www.niams.GolfingGoddess.com.br  U[place] Office on Women's Health: SouvenirBaseball.es.html  National Osteoporosis Foundation: ProfilePeek.ch  Summary  Osteoporosis is a condition that causes weak bones that are more likely to break.  Eating a healthy diet and making  sure you get enough calcium and vitamin D can help prevent osteoporosis.  Other ways to reduce your risk of osteoporosis include getting regular exercise and avoiding alcohol and products that contain nicotine or tobacco. This information is not intended to replace advice given to you by your health care provider. Make sure you discuss any questions you have with your health care provider. Document Released: 05/10/2015 Document Revised: 01/04/2016 Document Reviewed: 01/04/2016 Elsevier Interactive Patient Education  2018 [place].  +++++++++++++++++++++++ Preventive Care for Adults  A healthy lifestyle and preventive care can promote health and wellness. Preventive health guidelines for women  include the following key practices.  A routine yearly physical is a good way to check with your health care provider about your health and preventive screening. It is a chance to share any concerns and updates on your health and to receive a thorough exam.  Visit your dentist for a routine exam and preventive care every 6 months. Brush your teeth twice a day and floss once a day. Good oral hygiene prevents tooth decay and gum disease.  The frequency of eye exams is based on your age, health, family medical history, use of contact lenses, and other factors. Follow your health care provider's recommendations for frequency of eye exams.  Eat a healthy diet. Foods like vegetables, fruits, whole grains, low-fat dairy products, and lean protein foods contain the nutrients you need without too many calories. Decrease your intake of foods high in solid fats, added sugars, and salt. Eat the right amount of calories for you.Get information about a proper diet from your health care provider, if necessary.  Regular physical exercise is one of the most important things you can do for your health. Most adults should get at least 150 minutes of moderate-intensity exercise (any activity that increases your heart rate and causes you to sweat) each week. In addition, most adults need muscle-strengthening exercises on 2 or more days a week.  Maintain a healthy weight. The body mass index (BMI) is a screening tool to identify possible weight problems. It provides an estimate of body fat based on height and weight. Your health care provider can find your BMI and can help you achieve or maintain a healthy weight.For adults 20 years and older:  A BMI below 18.5 is considered underweight.  A BMI of 18.5 to 24.9 is normal.  A BMI of 25 to 29.9 is considered overweight.  A BMI of 30 and above is considered obese.  Maintain normal blood lipids and cholesterol levels by exercising and minimizing your intake of  saturated fat. Eat a balanced diet with plenty of fruit and vegetables. If your lipid or cholesterol levels are high, you are over 50, or you are at high risk for heart disease, you may need your cholesterol levels checked more frequently.Ongoing high lipid and cholesterol levels should be treated with medicines if diet and exercise are not working.  If you smoke, find out from your health care provider how to quit. If you do not use tobacco, do not start.  Lung cancer screening is recommended for adults aged 99-80 years who are at high risk for developing lung cancer because of a history of smoking. A yearly low-dose CT scan of the lungs is recommended for people who have at least a 30-pack-year history of smoking and are a current smoker or have quit within the past 15 years. A pack year of smoking is smoking an average of 1 pack of cigarettes a day for  1 year (for example: 1 pack a day for 30 years or 2 packs a day for 15 years). Yearly screening should continue until the smoker has stopped smoking for at least 15 years. Yearly screening should be stopped for people who develop a health problem that would prevent them from having lung cancer treatment.  Avoid use of street drugs. Do not share needles with anyone. Ask for help if you need support or instructions about stopping the use of drugs.  High blood pressure causes heart disease and increases the risk of stroke.  Ongoing high blood pressure should be treated with medicines if weight loss and exercise do not work.  If you are 42-36 years old, ask your health care provider if you should take aspirin to prevent strokes.  Diabetes screening involves taking a blood sample to check your fasting blood sugar level. This should be done once every 3 years, after age 61, if you are within normal weight and without risk factors for diabetes. Testing should be considered at a younger age or be carried out more frequently if you are overweight and have at  least 1 risk factor for diabetes.  Breast cancer screening is essential preventive care for women. You should practice "breast self-awareness." This means understanding the normal appearance and feel of your breasts and may include breast self-examination. Any changes detected, no matter how small, should be reported to a health care provider. Women in their 26s and 30s should have a clinical breast exam (CBE) by a health care provider as part of a regular health exam every 1 to 3 years. After age 14, women should have a CBE every year. Starting at age 62, women should consider having a mammogram (breast X-ray test) every year. Women who have a family history of breast cancer should talk to their health care provider about genetic screening. Women at a high risk of breast cancer should talk to their health care providers about having an MRI and a mammogram every year.  Breast cancer gene (BRCA)-related cancer risk assessment is recommended for women who have family members with BRCA-related cancers. BRCA-related cancers include breast, ovarian, tubal, and peritoneal cancers. Having family members with these cancers may be associated with an increased risk for harmful changes (mutations) in the breast cancer genes BRCA1 and BRCA2. Results of the assessment will determine the need for genetic counseling and BRCA1 and BRCA2 testing.  Routine pelvic exams to screen for cancer are no longer recommended for nonpregnant women who are considered low risk for cancer of the pelvic organs (ovaries, uterus, and vagina) and who do not have symptoms. Ask your health care provider if a screening pelvic exam is right for you.  If you have had past treatment for cervical cancer or a condition that could lead to cancer, you need Pap tests and screening for cancer for at least 20 years after your treatment. If Pap tests have been discontinued, your risk factors (such as having a new sexual partner) need to be reassessed to  determine if screening should be resumed. Some women have medical problems that increase the chance of getting cervical cancer. In these cases, your health care provider may recommend more frequent screening and Pap tests.    Colorectal cancer can be detected and often prevented. Most routine colorectal cancer screening begins at the age of 38 years and continues through age 18 years. However, your health care provider may recommend screening at an earlier age if you have risk factors for colon  cancer. On a yearly basis, your health care provider may provide home test kits to check for hidden blood in the stool. Use of a small camera at the end of a tube, to directly examine the colon (sigmoidoscopy or colonoscopy), can detect the earliest forms of colorectal cancer. Talk to your health care provider about this at age 67, when routine screening begins. Direct exam of the colon should be repeated every 5-10 years through age 63 years, unless early forms of pre-cancerous polyps or small growths are found.  Osteoporosis is a disease in which the bones lose minerals and strength with aging. This can result in serious bone fractures or breaks. The risk of osteoporosis can be identified using a bone density scan. Women ages 98 years and over and women at risk for fractures or osteoporosis should discuss screening with their health care providers. Ask your health care provider whether you should take a calcium supplement or vitamin D to reduce the rate of osteoporosis.  Menopause can be associated with physical symptoms and risks. Hormone replacement therapy is available to decrease symptoms and risks. You should talk to your health care provider about whether hormone replacement therapy is right for you.  Use sunscreen. Apply sunscreen liberally and repeatedly throughout the day. You should seek shade when your shadow is shorter than you. Protect yourself by wearing long sleeves, pants, a wide-brimmed hat, and  sunglasses year round, whenever you are outdoors.  Once a month, do a whole body skin exam, using a mirror to look at the skin on your back. Tell your health care provider of new moles, moles that have irregular borders, moles that are larger than a pencil eraser, or moles that have changed in shape or color.  Stay current with required vaccines (immunizations).  Influenza vaccine. All adults should be immunized every year.  Tetanus, diphtheria, and acellular pertussis (Td, Tdap) vaccine. Pregnant women should receive 1 dose of Tdap vaccine during each pregnancy. The dose should be obtained regardless of the length of time since the last dose. Immunization is preferred during the 27th-36th week of gestation. An adult who has not previously received Tdap or who does not know her vaccine status should receive 1 dose of Tdap. This initial dose should be followed by tetanus and diphtheria toxoids (Td) booster doses every 10 years. Adults with an unknown or incomplete history of completing a 3-dose immunization series with Td-containing vaccines should begin or complete a primary immunization series including a Tdap dose. Adults should receive a Td booster every 10 years.    Zoster vaccine. One dose is recommended for adults aged 45 years or older unless certain conditions are present.    Pneumococcal 13-valent conjugate (PCV13) vaccine. When indicated, a person who is uncertain of her immunization history and has no record of immunization should receive the PCV13 vaccine. An adult aged 27 years or older who has certain medical conditions and has not been previously immunized should receive 1 dose of PCV13 vaccine. This PCV13 should be followed with a dose of pneumococcal polysaccharide (PPSV23) vaccine. The PPSV23 vaccine dose should be obtained at least 8 weeks after the dose of PCV13 vaccine. An adult aged 87 years or older who has certain medical conditions and previously received 1 or more doses of  PPSV23 vaccine should receive 1 dose of PCV13. The PCV13 vaccine dose should be obtained 1 or more years after the last PPSV23 vaccine dose.    Pneumococcal polysaccharide (PPSV23) vaccine. When PCV13 is also indicated,  PCV13 should be obtained first. All adults aged 38 years and older should be immunized. An adult younger than age 67 years who has certain medical conditions should be immunized. Any person who resides in a nursing home or long-term care facility should be immunized. An adult smoker should be immunized. People with an immunocompromised condition and certain other conditions should receive both PCV13 and PPSV23 vaccines. People with human immunodeficiency virus (HIV) infection should be immunized as soon as possible after diagnosis. Immunization during chemotherapy or radiation therapy should be avoided. Routine use of PPSV23 vaccine is not recommended for American Indians, [place], or people younger than 65 years unless there are medical conditions that require PPSV23 vaccine. When indicated, people who have unknown immunization and have no record of immunization should receive PPSV23 vaccine. One-time revaccination 5 years after the first dose of PPSV23 is recommended for people aged 19-64 years who have chronic kidney failure, nephrotic syndrome, asplenia, or immunocompromised conditions. People who received 1-2 doses of PPSV23 before age 78 years should receive another dose of PPSV23 vaccine at age 37 years or later if at least 5 years have passed since the previous dose. Doses of PPSV23 are not needed for people immunized with PPSV23 at or after age 47 years.   Preventive Services / Frequency  Ages 61 years and over  Blood pressure check.  Lipid and cholesterol check.  Lung cancer screening. / Every year if you are aged 67-80 years and have a 30-pack-year history of smoking and currently smoke or have quit within the past 15 years. Yearly screening is stopped once you have  quit smoking for at least 15 years or develop a health problem that would prevent you from having lung cancer treatment.  Clinical breast exam.** / Every year after age 82 years.  BRCA-related cancer risk assessment.** / For women who have family members with a BRCA-related cancer (breast, ovarian, tubal, or peritoneal cancers).  Mammogram.** / Every year beginning at age 59 years and continuing for as long as you are in good health. Consult with your health care provider.  Pap test.** / Every 3 years starting at age 33 years through age 46 or 41 years with 3 consecutive normal Pap tests. Testing can be stopped between 65 and 70 years with 3 consecutive normal Pap tests and no abnormal Pap or HPV tests in the past 10 years.  Fecal occult blood test (FOBT) of stool. / Every year beginning at age 58 years and continuing until age 72 years. You may not need to do this test if you get a colonoscopy every 10 years.  Flexible sigmoidoscopy or colonoscopy.** / Every 5 years for a flexible sigmoidoscopy or every 10 years for a colonoscopy beginning at age 81 years and continuing until age 57 years.  Hepatitis C blood test.** / For all people born from [birth year] through 1963 and any individual with known risks for hepatitis C.  Osteoporosis screening.** / A one-time screening for women ages 28 years and over and women at risk for fractures or osteoporosis.  Skin self-exam. / Monthly.  Influenza vaccine. / Every year.  Tetanus, diphtheria, and acellular pertussis (Tdap/Td) vaccine.** / 1 dose of Td every 10 years.  Zoster vaccine.** / 1 dose for adults aged 30 years or older.  Pneumococcal 13-valent conjugate (PCV13) vaccine.** / Consult your health care provider.  Pneumococcal polysaccharide (PPSV23) vaccine.** / 1 dose for all adults aged 35 years and older. Screening for abdominal aortic aneurysm (AAA)  by ultrasound  is recommended for people who have history of high blood pressure or who are  current or former smokers. ++++++++++++++++++++ Recommend Adult Low Dose Aspirin or  coated  Aspirin 81 mg daily  To reduce risk of Colon Cancer 20 %,  Skin Cancer 26 % ,  Melanoma 46%  and  Pancreatic cancer 60% ++++++++++++++++++++ Vitamin D goal  is between 70-100.  Please make sure that you are taking your Vitamin D as directed.  It is very important as a natural anti-inflammatory  helping hair, skin, and nails, as well as reducing stroke and heart attack risk.  It helps your bones and helps with mood. It also decreases numerous cancer risks so please take it as directed.  Low Vit D is associated with a 200-300% higher risk for CANCER  and 200-300% higher risk for HEART   ATTACK  &  STROKE.   .....................................[name] It is also associated with higher death rate at younger ages,  autoimmune diseases like Rheumatoid arthritis, Lupus, Multiple Sclerosis.    Also many other serious conditions, like depression, Alzheimer's Dementia, infertility, muscle aches, fatigue, fibromyalgia - just to name a few. ++++++++++++++++++ Recommend the book "The END of DIETING" by Dr Excell Seltzer  & the book "The END of DIABETES " by Dr Excell Seltzer At [email or web address] - get book & Audio CD's    Being diabetic has a  300% increased risk for heart attack, stroke, cancer, and alzheimer- type vascular dementia. It is very important that you work harder with diet by avoiding all foods that are white. Avoid white rice (brown & wild rice is OK), white potatoes (sweetpotatoes in moderation is OK), White bread or wheat bread or anything made out of white flour like bagels, donuts, rolls, buns, biscuits, cakes, pastries, cookies, pizza crust, and pasta (made from white flour & egg whites) - vegetarian pasta or spinach or wheat pasta is OK. Multigrain breads like Arnold's or Pepperidge Farm, or multigrain sandwich thins or flatbreads.  Diet, exercise and weight loss can reverse and cure diabetes in the early  stages.  Diet, exercise and weight loss is very important in the control and prevention of complications of diabetes which affects every system in your body, ie. Brain - dementia/stroke, eyes - glaucoma/blindness, heart - heart attack/heart failure, kidneys - dialysis, stomach - gastric paralysis, intestines - malabsorption, nerves - severe painful neuritis, circulation - gangrene & loss of a leg(s), and finally cancer and Alzheimers.    I recommend avoid fried & greasy foods,  sweets/candy, white rice (brown or wild rice or Quinoa is OK), white potatoes (sweet potatoes are OK) - anything made from white flour - bagels, doughnuts, rolls, buns, biscuits,white and wheat breads, pizza crust and traditional pasta made of white flour & egg white(vegetarian pasta or spinach or wheat pasta is OK).  Multi-grain bread is OK - like multi-grain flat bread or sandwich thins. Avoid alcohol in excess. Exercise is also important.    Eat all the vegetables you want - avoid meat, especially red meat and dairy - especially cheese.  Cheese is the most concentrated form of trans-fats which is the worst thing to clog up our arteries. Veggie cheese is OK which can be found in the fresh produce section at [employer] or [employer] or [employer]  +++++++++++++++++++ DASH Eating Plan  DASH stands for "Dietary Approaches to Stop Hypertension."   The DASH eating plan is a healthy eating plan that has been shown to reduce high blood pressure (hypertension). Additional health benefits  may include reducing the risk of type 2 diabetes mellitus, heart disease, and stroke. The DASH eating plan may also help with weight loss. WHAT DO I NEED TO KNOW ABOUT THE DASH EATING PLAN? For the DASH eating plan, you will follow these general guidelines:  Choose foods with a percent daily value for sodium of less than 5% (as listed on the food label).  Use salt-free seasonings or herbs instead of table salt or sea salt.  Check with your  health care provider or pharmacist before using salt substitutes.  Eat lower-sodium products, often labeled as "lower sodium" or "no salt added."  Eat fresh foods.  Eat more vegetables, fruits, and low-fat dairy products.  Choose whole grains. Look for the word "whole" as the first word in the ingredient list.  Choose fish   Limit sweets, desserts, sugars, and sugary drinks.  Choose heart-healthy fats.  Eat veggie cheese   Eat more home-cooked food and less restaurant, buffet, and fast food.  Limit fried foods.  Cook foods using methods other than frying.  Limit canned vegetables. If you do use them, rinse them well to decrease the sodium.  When eating at a restaurant, ask that your food be prepared with less salt, or no salt if possible.                      WHAT FOODS CAN I EAT? Read Dr Fara Olden Fuhrman's books on The End of Dieting & The End of Diabetes  Grains Whole grain or whole wheat bread. Brown rice. Whole grain or whole wheat pasta. Quinoa, bulgur, and whole grain cereals. Low-sodium cereals. Corn or whole wheat flour tortillas. Whole grain cornbread. Whole grain crackers. Low-sodium crackers.  Vegetables Fresh or frozen vegetables (raw, steamed, roasted, or grilled). Low-sodium or reduced-sodium tomato and vegetable juices. Low-sodium or reduced-sodium tomato sauce and paste. Low-sodium or reduced-sodium canned vegetables.   Fruits All fresh, canned (in natural juice), or frozen fruits.  Protein Products  All fish and seafood.  Dried beans, peas, or lentils. Unsalted nuts and seeds. Unsalted canned beans.  Dairy Low-fat dairy products, such as skim or 1% milk, 2% or reduced-fat cheeses, low-fat ricotta or cottage cheese, or plain low-fat yogurt. Low-sodium or reduced-sodium cheeses.  Fats and Oils Tub margarines without trans fats. Light or reduced-fat mayonnaise and salad dressings (reduced sodium). Avocado. Safflower, olive, or canola oils. Natural peanut or  almond butter.  Other Unsalted popcorn and pretzels. The items listed above may not be a complete list of recommended foods or beverages. Contact your dietitian for more options.  +++++++++++++++  WHAT FOODS ARE NOT RECOMMENDED? Grains/ White flour or wheat flour White bread. White pasta. White rice. Refined cornbread. Bagels and croissants. Crackers that contain trans fat.  Vegetables  Creamed or fried vegetables. Vegetables in a . Regular canned vegetables. Regular canned tomato sauce and paste. Regular tomato and vegetable juices.  Fruits Dried fruits. Canned fruit in light or heavy syrup. Fruit juice.  Meat and Other Protein Products Meat in general - RED meat & White meat.  Fatty cuts of meat. Ribs, chicken wings, all processed meats as bacon, sausage, bologna, salami, fatback, hot dogs, bratwurst and packaged luncheon meats.  Dairy Whole or 2% milk, cream, half-and-half, and cream cheese. Whole-fat or sweetened yogurt. Full-fat cheeses or blue cheese. Non-dairy creamers and whipped toppings. Processed cheese, cheese spreads, or cheese curds.  Condiments Onion and garlic salt, seasoned salt, table salt, and sea salt. Canned and packaged gravies.  Worcestershire sauce. Tartar sauce. Barbecue sauce. Teriyaki sauce. Soy sauce, including reduced sodium. Steak sauce. Fish sauce. Oyster sauce. Cocktail sauce. Horseradish. Ketchup and mustard. Meat flavorings and tenderizers. Bouillon cubes. Hot sauce. Tabasco sauce. Marinades. Taco seasonings. Relishes.  Fats and Oils Butter, stick margarine, lard, shortening and bacon fat. Coconut, palm kernel, or palm oils. Regular salad dressings.  Pickles and olives. Salted popcorn and pretzels.  The items listed above may not be a complete list of foods and beverages to avoid.

## 2017-07-25 ENCOUNTER — Ambulatory Visit (INDEPENDENT_AMBULATORY_CARE_PROVIDER_SITE_OTHER): Payer: PPO | Admitting: Internal Medicine

## 2017-07-25 ENCOUNTER — Encounter: Payer: Self-pay | Admitting: Internal Medicine

## 2017-07-25 VITALS — BP 152/76 | HR 60 | Temp 97.1°F | Resp 16 | Ht 63.5 in | Wt 155.0 lb

## 2017-07-25 DIAGNOSIS — Z136 Encounter for screening for cardiovascular disorders: Secondary | ICD-10-CM | POA: Diagnosis not present

## 2017-07-25 DIAGNOSIS — K219 Gastro-esophageal reflux disease without esophagitis: Secondary | ICD-10-CM

## 2017-07-25 DIAGNOSIS — Z0000 Encounter for general adult medical examination without abnormal findings: Secondary | ICD-10-CM | POA: Diagnosis not present

## 2017-07-25 DIAGNOSIS — Z0001 Encounter for general adult medical examination with abnormal findings: Secondary | ICD-10-CM

## 2017-07-25 DIAGNOSIS — Z79899 Other long term (current) drug therapy: Secondary | ICD-10-CM | POA: Diagnosis not present

## 2017-07-25 DIAGNOSIS — Z1211 Encounter for screening for malignant neoplasm of colon: Secondary | ICD-10-CM

## 2017-07-25 DIAGNOSIS — I10 Essential (primary) hypertension: Secondary | ICD-10-CM | POA: Diagnosis not present

## 2017-07-25 DIAGNOSIS — R7303 Prediabetes: Secondary | ICD-10-CM

## 2017-07-25 DIAGNOSIS — R7309 Other abnormal glucose: Secondary | ICD-10-CM

## 2017-07-25 DIAGNOSIS — G894 Chronic pain syndrome: Secondary | ICD-10-CM

## 2017-07-25 DIAGNOSIS — Z1212 Encounter for screening for malignant neoplasm of rectum: Secondary | ICD-10-CM

## 2017-07-25 DIAGNOSIS — I700 Atherosclerosis of aorta: Secondary | ICD-10-CM

## 2017-07-25 DIAGNOSIS — E559 Vitamin D deficiency, unspecified: Secondary | ICD-10-CM

## 2017-07-25 DIAGNOSIS — E782 Mixed hyperlipidemia: Secondary | ICD-10-CM | POA: Diagnosis not present

## 2017-07-25 DIAGNOSIS — M810 Age-related osteoporosis without current pathological fracture: Secondary | ICD-10-CM

## 2017-07-25 DIAGNOSIS — I2510 Atherosclerotic heart disease of native coronary artery without angina pectoris: Secondary | ICD-10-CM

## 2017-07-25 DIAGNOSIS — I2583 Coronary atherosclerosis due to lipid rich plaque: Secondary | ICD-10-CM

## 2017-07-25 MED ORDER — RISEDRONATE SODIUM 150 MG PO TABS: 150.0000 mg | ORAL_TABLET | ORAL | 3 refills | Status: DC

## 2017-07-26 ENCOUNTER — Ambulatory Visit (INDEPENDENT_AMBULATORY_CARE_PROVIDER_SITE_OTHER): Payer: PPO | Admitting: Specialist

## 2017-07-26 ENCOUNTER — Encounter (INDEPENDENT_AMBULATORY_CARE_PROVIDER_SITE_OTHER): Payer: Self-pay | Admitting: Specialist

## 2017-07-26 VITALS — BP 141/77 | HR 60

## 2017-07-26 DIAGNOSIS — M4326 Fusion of spine, lumbar region: Secondary | ICD-10-CM | POA: Diagnosis not present

## 2017-07-26 DIAGNOSIS — M4005 Postural kyphosis, thoracolumbar region: Secondary | ICD-10-CM

## 2017-07-26 LAB — BASIC METABOLIC PANEL WITH GFR
BUN/Creatinine Ratio: 17 (calc) (ref 6–22)
BUN: 17 mg/dL (ref 7–25)
CALCIUM: 10.2 mg/dL (ref 8.6–10.4)
CO2: 27 mmol/L (ref 20–32)
CREATININE: 0.98 mg/dL — AB (ref 0.60–0.93)
Chloride: 106 mmol/L (ref 98–110)
GFR, Est African American: 64 mL/min/{1.73_m2} (ref >=60)
GFR, Est Non African American: 55 mL/min/{1.73_m2} — ABNORMAL LOW (ref >=60)
Glucose, Bld: 112 mg/dL — ABNORMAL HIGH (ref 65–99)
Potassium: 4.4 mmol/L (ref 3.5–5.3)
Sodium: 141 mmol/L (ref 135–146)

## 2017-07-26 LAB — CBC WITH DIFFERENTIAL/PLATELET
Basophils Absolute: 71 cells/uL (ref 0–200)
Basophils Relative: 1.2 %
EOS PCT: 4.6 %
Eosinophils Absolute: 271 cells/uL (ref 15–500)
HEMATOCRIT: 43.3 % (ref 35.0–45.0)
HEMOGLOBIN: 14.9 g/dL (ref 11.7–15.5)
LYMPHS ABS: 2018 {cells}/uL (ref 850–3900)
MCH: 32.3 pg (ref 27.0–33.0)
MCHC: 34.4 g/dL (ref 32.0–36.0)
MCV: 93.7 fL (ref 80.0–100.0)
MPV: 10.7 fL (ref 7.5–12.5)
Monocytes Relative: 10.2 %
NEUTROS ABS: 2938 {cells}/uL (ref 1500–7800)
Neutrophils Relative %: 49.8 %
Platelets: 323 10*3/uL (ref 140–400)
RBC: 4.62 10*6/uL (ref 3.80–5.10)
RDW: 12.4 % (ref 11.0–15.0)
Total Lymphocyte: 34.2 %
WBC mixed population: 602 cells/uL (ref 200–950)
WBC: 5.9 10*3/uL (ref 3.8–10.8)

## 2017-07-26 LAB — HEPATIC FUNCTION PANEL
AG RATIO: 1.5 (calc) (ref 1.0–2.5)
ALBUMIN MSPROF: 4.1 g/dL (ref 3.6–5.1)
ALT: 30 U/L — AB (ref 6–29)
AST: 23 U/L (ref 10–35)
Alkaline phosphatase (APISO): 57 U/L (ref 33–130)
BILIRUBIN TOTAL: 0.9 mg/dL (ref 0.2–1.2)
Bilirubin, Direct: 0.2 mg/dL (ref 0.0–0.2)
Globulin: 2.8 g/dL (calc) (ref 1.9–3.7)
Indirect Bilirubin: 0.7 mg/dL (calc) (ref 0.2–1.2)
Total Protein: 6.9 g/dL (ref 6.1–8.1)

## 2017-07-26 LAB — LIPID PANEL
CHOL/HDL RATIO: 2 (calc) (ref <5.0)
Cholesterol: 169 mg/dL (ref <200)
HDL: 83 mg/dL (ref >50)
LDL Cholesterol (Calc): 65 mg/dL (calc)
NON-HDL CHOLESTEROL (CALC): 86 mg/dL (ref <130)
TRIGLYCERIDES: 131 mg/dL (ref <150)

## 2017-07-26 LAB — VITAMIN D 25 HYDROXY (VIT D DEFICIENCY, FRACTURES): Vit D, 25-Hydroxy: 48 ng/mL (ref 30–100)

## 2017-07-26 LAB — HEMOGLOBIN A1C
Hgb A1c MFr Bld: 5.7 % of total Hgb — ABNORMAL HIGH (ref <5.7)
Mean Plasma Glucose: 117 (calc)
eAG (mmol/L): 6.5 (calc)

## 2017-07-26 LAB — TSH: TSH: 0.94 mIU/L (ref 0.40–4.50)

## 2017-07-26 LAB — URINALYSIS, ROUTINE W REFLEX MICROSCOPIC
BILIRUBIN URINE: NEGATIVE
GLUCOSE, UA: NEGATIVE
Hgb urine dipstick: NEGATIVE
KETONES UR: NEGATIVE
Leukocytes, UA: NEGATIVE
Nitrite: NEGATIVE
Protein, ur: NEGATIVE
SPECIFIC GRAVITY, URINE: 1.011 (ref 1.001–1.03)
pH: 7 (ref 5.0–8.0)

## 2017-07-26 LAB — MAGNESIUM: Magnesium: 2 mg/dL (ref 1.5–2.5)

## 2017-07-26 LAB — MICROALBUMIN / CREATININE URINE RATIO
Creatinine, Urine: 31 mg/dL (ref 20–275)
Microalb Creat Ratio: 16 mcg/mg creat (ref <30)
Microalb, Ur: 0.5 mg/dL

## 2017-07-26 LAB — INSULIN, RANDOM: INSULIN: 28.8 u[IU]/mL — AB (ref 2.0–19.6)

## 2017-07-26 NOTE — Progress Notes (Signed)
Office Visit Note   Patient: [name] [name]           Date of Birth: [birth year]           MRN: [id number] Visit Date: 07/26/2017              Requested by: [name], [address] [place], [place] [address] PCP: [name], MD   Assessment & Plan: Visit Diagnoses:  1. Fusion of lumbar spine   2. Postural kyphosis of lumbar region     Plan: Avoid frequent bending and stooping  No lifting greater than 10 lbs. May use ice or moist heat for pain. Weight loss is of benefit.  Follow-Up Instructions: Return in about 3 months (around 10/26/2017).   Orders:  No orders of the defined types were placed in this encounter.  No orders of the defined types were placed in this encounter.     Procedures: No procedures performed   Clinical Data: No additional findings.   Subjective: Chief Complaint  Patient presents with  . Lower Back - Follow-up    82 year old female with history of recurring back pain with chronic discomfort. She sees Dr. Hardin Negus and is taking about 4 percocet per day.    Review of Systems  Constitutional: Negative.   HENT: Negative.   Eyes: Negative.   Respiratory: Negative.   Cardiovascular: Negative.   Gastrointestinal: Negative.   Endocrine: Negative.   Genitourinary: Negative.   Musculoskeletal: Negative.   Skin: Negative.   Allergic/Immunologic: Negative.   Neurological: Negative.   Hematological: Negative.   Psychiatric/Behavioral: Negative.      Objective: Vital Signs: BP (!) 141/77   Pulse 60   Physical Exam  Constitutional: She is oriented to person, place, and time. She appears well-developed and well-nourished.  HENT:  Head: Normocephalic and atraumatic.  Eyes: EOM are normal. Pupils are equal, round, and reactive to light.  Neck: Normal range of motion. Neck supple.  Pulmonary/Chest: Effort normal and breath sounds normal.  Abdominal: Soft. Bowel sounds are normal.  Neurological: She is  alert and oriented to person, place, and time.  Skin: Skin is warm and dry.  Psychiatric: She has a normal mood and affect. Her behavior is normal. Judgment and thought content normal.    Back Exam   Tenderness  The patient is experiencing tenderness in the lumbar.  Range of Motion  Extension: abnormal  Flexion: normal  Lateral bend right: abnormal  Lateral bend left: abnormal  Rotation right: abnormal  Rotation left: abnormal   Muscle Strength  The patient has normal back strength. Right Quadriceps:  5/5  Left Quadriceps:  5/5  Right Hamstrings:  5/5  Left Hamstrings:  5/5   Tests  Straight leg raise right: negative Straight leg raise left: negative  Reflexes  Patellar: normal Achilles: normal Babinski's sign: normal   Other  Toe walk: normal Heel walk: normal Sensation: normal Gait: normal  Erythema: no back redness Scars: present      Specialty Comments:  No specialty comments available.  Imaging: No results found.   PMFS History: Patient Active Problem List   Diagnosis Date Noted  . Herniated lumbar intervertebral disc 06/25/2012    Priority: High    Class: Acute  . Atherosclerosis of aorta ([place]) 04/05/2017  . Costochondritis 11/14/2016  . Anxiety state 06/08/2016  . History of lumbar spinal fusion 04/13/2016  . Spondylolisthesis, lumbar region 04/13/2016  . Leukocytosis 03/28/2016  . Ileus ([place])   . Spinal  stenosis, lumbar region, with neurogenic claudication 05/29/2015  . DDD (degenerative disc disease), lumbar 04/21/2015  . Mixed hyperlipidemia 11/24/2014  . GERD  11/24/2014  . Depression, major, in partial remission ([place]) 09/26/2014  . Chronic pain syndrome 09/26/2014  . Coronary artery disease due to lipid rich plaque   . Osteoporosis 02/10/2014  . Prediabetes 08/14/2013  . Vitamin D deficiency 08/14/2013  . Medication management 08/14/2013  . Constipation, chronic 04/03/2013  . MGUS (monoclonal gammopathy of unknown significance)  03/05/2013  . Vertebral compression fracture ([place]) 06/23/2012  . ESOPHAGEAL STRICTURE 08/28/2008  . Asthma 07/29/2008  . Incarcerated recurrent paraesophageal hiatal hernia s/p lap redo repair [id number] 01/31/2008  . Essential hypertension 07/25/2007   Past Medical History:  Diagnosis Date  . Anxiety    takes Xanax daily as needed  . Asthma    Albuterol daily as needed  . Blood transfusion without reported diagnosis   . CAD (coronary artery disease)    LHC 2003 with 40% pLAD, 30% mLAD, 100% D1 (moderate vessel), 100%  D2 (small vessel), 95% mid small OM2.  Pt had PTCA to D1;  D2 and OM2 small vessels and tx medically;  Last Myoview (2012): anterior infarct seen with scar, no ischemia. EF normal. Pt managed medically;  Lexiscan Myoview (12/14):  Low risk; ant scar with very mild peri-infarct ischemia; EF 55% with ant AK   . Cataract   . Chronic back pain    HNP, DDD, spinal stenosis, vertebral compression fractures.   . Chronic nausea    takes Zofran daily as needed.  normal gastric emptying study 03/2013  . Constipation    felt to be functional. takes Senokot and Miralax daily.  treated with Linzess in past.   . DIVERTICULOSIS, COLON 08/28/2008   Qualifier: Diagnosis of  By: [name], [name], [name]    . Elevated LFTs 2015   probably med induced DILI, from Augmentin vs Pravastatin  . GERD (gastroesophageal reflux disease)    hx of esophageal stricture   . Hiatal hernia    Paraesophageal hernias. s/p fundoplications in 2952 and 04/2013  . History of bronchitis several of yrs ago  . History of colon polyps 03/2009, 03/2011   adenomatous colon polyps, no high grade dysplasia.   [name] History of vertigo    no meds  . Hyperlipidemia    takes Pravastatin daily  . Hypertension    takes Amlodipine,Metoprolol,and Losartan daily  . Nausea 03/2016  . Osteoporosis   . PONV (postoperative nausea and vomiting)    yrs ago  . Slow urinary stream    takes Rapaflo daily  . TIA (transient ischemic  attack) 1995   no residual effects noted    Family History  Problem Relation Age of Onset  . Colon cancer Mother 74  . Heart disease Father 25       heart attack  . Brain cancer Brother        tumor   . Heart disease Brother   . Heart disease Brother   . Kidney disease Daughter     Past Surgical History:  Procedure Laterality Date  . APPENDECTOMY     patient unsure of date  . BACK SURGERY    . BLADDER REPAIR    . CHOLECYSTECTOMY     Patient unsure of date.  . CORONARY ANGIOPLASTY  11/16/2001   1 stent  . ESOPHAGEAL MANOMETRY N/A 03/25/2013   Procedure: ESOPHAGEAL MANOMETRY (EM);  Surgeon: Inda Castle, MD;  Location: [facility] END[facility];  Service: Endoscopy;  Laterality: N/A;  . ESOPHAGOGASTRODUODENOSCOPY N/A 03/15/2013   Procedure: ESOPHAGOGASTRODUODENOSCOPY (EGD);  Surgeon: Jerene Bears, MD;  Location: [facility] ENDOSCOPY;  Service: Endoscopy;  Laterality: N/A;  . ESOPHAGOGASTRODUODENOSCOPY N/A 12/25/2015   Procedure: ESOPHAGOGASTRODUODENOSCOPY (EGD);  Surgeon: Doran Stabler, MD;  Location[facility] ENDOSCOPY;  Service: Endoscopy;  Laterality: N/A;  . EYE SURGERY  11/2000   bilateral cataracts with lens implant  . FIXATION KYPHOPLASTY LUMBAR SPINE  X 2   "L3-4"  . HEMORRHOID SURGERY    . HIATAL HERNIA REPAIR  06/03/2011   Procedure: LAPAROSCOPIC REPAIR OF HIATAL HERNIA;  Surgeon: Adin Hector, MD;  Location: [facility] ORS;  Service: General;  Laterality: N/A;  . HIATAL HERNIA REPAIR N/A 04/24/2013   Procedure: LAPAROSCOPIC  REPAIR RECURRENT PARASOPHAGEAL HIATAL HERNIA WITH FUNDOPLICATION;  Surgeon: Adin Hector, MD;  Location: [facility] ORS;  Service: General;  Laterality: N/A;  . INSERTION OF MESH N/A 04/24/2013   Procedure: INSERTION OF MESH;  Surgeon: Adin Hector, MD;  Location: [facility] OR[facility];  Service: General;  Laterality: N/A;  . LAPAROSCOPIC LYSIS OF ADHESIONS N/A 04/24/2013   Procedure: LAPAROSCOPIC LYSIS OF ADHESIONS;  Surgeon: Adin Hector, MD;  Location: [facility];  Service: General;   Laterality: N/A;  . LAPAROSCOPIC NISSEN FUNDOPLICATION  8/58/8502   Procedure: LAPAROSCOPIC NISSEN FUNDOPLICATION;  Surgeon: Adin Hector, MD;  Location: [facility];  Service: General;  Laterality: N/A;  . LUMBAR FUSION  12/07/2015   Right L2-3 facetectomy, posterolateral fusion L2-3 with pedicle screws, rods, local bone graft, Vivigen cancellous chips. Fusion extended to the L1 level with pedicle screws and rods  . LUMBAR LAMINECTOMY/DECOMPRESSION MICRODISCECTOMY Right 06/26/2012   Procedure: LUMBAR LAMINECTOMY/DECOMPRESSION MICRODISCECTOMY;  Surgeon: Jessy Oto, MD;  Location: [place];  Service: Orthopedics;  Laterality: Right;  RIGHT L4-5 MICRODISCECTOMY  . LUMBAR LAMINECTOMY/DECOMPRESSION MICRODISCECTOMY N/A 05/29/2015   Procedure: RIGHT L2-3 MICRODISCECTOMY;  Surgeon: Jessy Oto, MD;  Location: [place];  Service: Orthopedics;  Laterality: N/A;  . TOTAL ABDOMINAL HYSTERECTOMY  1975   partial   Social History   Occupational History  . Occupation: Retired    Fish farm manager: RETIRED    Comment: worked at [employer]  . Smoking status: Never Smoker  . Smokeless tobacco: Never Used  Substance and Sexual Activity  . Alcohol use: No  . Drug use: No  . Sexual activity: No

## 2017-07-26 NOTE — Patient Instructions (Signed)
Avoid frequent bending and stooping  No lifting greater than 10 lbs. May use ice or moist heat for pain. Weight loss is of benefit.   

## 2017-07-31 ENCOUNTER — Other Ambulatory Visit: Payer: Self-pay | Admitting: *Deleted

## 2017-07-31 MED ORDER — PRAVASTATIN SODIUM 40 MG PO TABS: 40.0000 mg | ORAL_TABLET | Freq: Every day | ORAL | 1 refills | Status: DC

## 2017-08-01 DIAGNOSIS — G894 Chronic pain syndrome: Secondary | ICD-10-CM | POA: Diagnosis not present

## 2017-08-01 DIAGNOSIS — Z79891 Long term (current) use of opiate analgesic: Secondary | ICD-10-CM | POA: Diagnosis not present

## 2017-08-01 DIAGNOSIS — M4726 Other spondylosis with radiculopathy, lumbar region: Secondary | ICD-10-CM | POA: Diagnosis not present

## 2017-08-01 DIAGNOSIS — M961 Postlaminectomy syndrome, not elsewhere classified: Secondary | ICD-10-CM | POA: Diagnosis not present

## 2017-08-04 ENCOUNTER — Other Ambulatory Visit: Payer: Self-pay | Admitting: Gastroenterology

## 2017-08-09 ENCOUNTER — Ambulatory Visit (INDEPENDENT_AMBULATORY_CARE_PROVIDER_SITE_OTHER): Payer: PPO | Admitting: Adult Health

## 2017-08-09 ENCOUNTER — Ambulatory Visit (HOSPITAL_COMMUNITY)
Admission: RE | Admit: 2017-08-09 | Discharge: 2017-08-09 | Disposition: A | Discharge status: Home / Self Care | Payer: PPO | Source: Ambulatory Visit | Attending: Adult Health | Admitting: Adult Health

## 2017-08-09 ENCOUNTER — Encounter: Payer: Self-pay | Admitting: Adult Health

## 2017-08-09 VITALS — BP 130/84 | HR 65 | Temp 97.5°F | Ht 63.5 in | Wt 160.0 lb

## 2017-08-09 DIAGNOSIS — R05 Cough: Secondary | ICD-10-CM | POA: Diagnosis not present

## 2017-08-09 DIAGNOSIS — J209 Acute bronchitis, unspecified: Secondary | ICD-10-CM

## 2017-08-09 DIAGNOSIS — J9811 Atelectasis: Secondary | ICD-10-CM | POA: Insufficient documentation

## 2017-08-09 DIAGNOSIS — M4856XA Collapsed vertebra, not elsewhere classified, lumbar region, initial encounter for fracture: Secondary | ICD-10-CM | POA: Insufficient documentation

## 2017-08-09 DIAGNOSIS — R0989 Other specified symptoms and signs involving the circulatory and respiratory systems: Secondary | ICD-10-CM | POA: Insufficient documentation

## 2017-08-09 DIAGNOSIS — R059 Cough, unspecified: Secondary | ICD-10-CM

## 2017-08-09 DIAGNOSIS — J42 Unspecified chronic bronchitis: Secondary | ICD-10-CM | POA: Insufficient documentation

## 2017-08-09 DIAGNOSIS — R918 Other nonspecific abnormal finding of lung field: Secondary | ICD-10-CM | POA: Diagnosis not present

## 2017-08-09 DIAGNOSIS — J45901 Unspecified asthma with (acute) exacerbation: Secondary | ICD-10-CM

## 2017-08-09 DIAGNOSIS — R0602 Shortness of breath: Secondary | ICD-10-CM | POA: Diagnosis not present

## 2017-08-09 MED ORDER — IPRATROPIUM-ALBUTEROL 0.5-2.5 (3) MG/3ML IN SOLN: RESPIRATORY_TRACT | 99 refills | Status: DC

## 2017-08-09 MED ORDER — AZITHROMYCIN 250 MG PO TABS: ORAL_TABLET | ORAL | 1 refills | Status: AC

## 2017-08-09 MED ORDER — BUDESONIDE-FORMOTEROL FUMARATE 160-4.5 MCG/ACT IN AERO: 1.0000 | INHALATION_SPRAY | Freq: Two times a day (BID) | RESPIRATORY_TRACT | 12 refills | Status: DC

## 2017-08-09 MED ORDER — PREDNISONE 20 MG PO TABS: ORAL_TABLET | ORAL | 0 refills | Status: AC

## 2017-08-09 NOTE — Progress Notes (Signed)
Assessment and Plan:   Acute bronchitis, unspecified organism Very coarse bronchial sounds throughout; ? Diagnosis of asthma, recommended compliance with inhalers and refilled today, will do longer taper of prednisone and repeat zpak pending CXR results. Encouraged her to schedule a follow up appointment with pulmonology for repeat PFTs for ?chronic bronchitis for recurrent URI with extended duration. Will refer back if necessary.  -     azithromycin (ZITHROMAX) 250 MG tablet; Take 2 tablets (500 mg) on  Day 1,  followed by 1 tablet (250 mg) once daily on Days 2 through 5. -     predniSONE (DELTASONE) 20 MG tablet; 3 tablets daily with food for 3 days, 2 tabs daily for 3 days, 1 tab a day for 5 days. -      ipratropium-albuterol (DUONEB) 0.5-2.5 (3) MG/3ML SOLN; USE ONE VIAL IN NEBULIZER 4 TIMES DAILY -     budesonide-formoterol (SYMBICORT) 160-4.5 MCG/ACT inhaler; Inhale 1 puff into the lungs 2 (two) times daily.  Abnormal lung sounds -     DG Chest 2 View; Future  Further disposition pending results of labs. Discussed med's effects and SE's.   Over 15 minutes of exam, counseling, chart review, and critical decision making was performed.   Future Appointments  Date Time Provider [place]  10/10/2017 10:45 AM [name], MD [facility] None  10/26/2017 10:00 AM Jessy Oto, MD PO[facility] None  11/30/2017 10:45 AM [name], NP [facility] None  03/02/2018 10:30 AM [name], MD [facility] None  08/28/2018 10:00 AM [name], MD [facility] None    ------------------------------------------------------------------------------------------------------------------   HPI BP 130/84   Pulse 65   Temp (!) 97.5 F (36.4 C)   Ht 5' 3.5" (1.613 m)   Wt 160 lb (72.6 kg)   SpO2 96%   BMI 27.90 kg/m   82 y.o.female presents for ongoing URI symptoms; productive cough (thick green/brown), wheezing, some dyspnea after coughing fits. She does endorse some nasal congestion  and endorses chest congestion. Denies fever/chills/fatigue, mild headache with coughing. She was seen for similar in 07/11/2017 and diagnosed with bronchitis at that time, completed zpak, prednisone taper as prescribed but has not been using inhalers as recommended. She presents speaking in complete sentences in no acute distress.   She has hx of ?asthma, seen by Dr. Melvyn Novas last in 2015 - however she has been prescribed symbicort, duonebs and albuterol inhaler in the past. She reports she has had PFTs done in the past, but these results are not available today for review.   Past Medical History:  Diagnosis Date  . Anxiety    takes Xanax daily as needed  . Asthma    Albuterol daily as needed  . Blood transfusion without reported diagnosis   . CAD (coronary artery disease)    LHC 2003 with 40% pLAD, 30% mLAD, 100% D1 (moderate vessel), 100%  D2 (small vessel), 95% mid small OM2.  Pt had PTCA to D1;  D2 and OM2 small vessels and tx medically;  Last Myoview (2012): anterior infarct seen with scar, no ischemia. EF normal. Pt managed medically;  Lexiscan Myoview (12/14):  Low risk; ant scar with very mild peri-infarct ischemia; EF 55% with ant AK   . Cataract   . Chronic back pain    HNP, DDD, spinal stenosis, vertebral compression fractures.   . Chronic nausea    takes Zofran daily as needed.  normal gastric emptying study 03/2013  . Constipation    felt to be functional. takes Senokot and  Miralax daily.  treated with Linzess in past.   . DIVERTICULOSIS, COLON 08/28/2008   Qualifier: Diagnosis of  By: [name], [name], [name]    . Elevated LFTs 2015   probably med induced DILI, from Augmentin vs Pravastatin  . GERD (gastroesophageal reflux disease)    hx of esophageal stricture   . Hiatal hernia    Paraesophageal hernias. s/p fundoplications in 4235 and 04/2013  . History of bronchitis several of yrs ago  . History of colon polyps 03/2009, 03/2011   adenomatous colon polyps, no high grade dysplasia.    [name] History of vertigo    no meds  . Hyperlipidemia    takes Pravastatin daily  . Hypertension    takes Amlodipine,Metoprolol,and Losartan daily  . Nausea 03/2016  . Osteoporosis   . PONV (postoperative nausea and vomiting)    yrs ago  . Slow urinary stream    takes Rapaflo daily  . TIA (transient ischemic attack) 1995   no residual effects noted     Allergies  Allergen Reactions  . Ace Inhibitors Cough    Per Dr Melvyn Novas pulmonology 2013  . Aspartame Diarrhea and Nausea And Vomiting  . Atorvastatin Other (See Comments)    Muscle aches  . Biaxin [Clarithromycin] Other (See Comments)    PATIENT CAN TOLERATE Z-PAK.  Is written on patient's paper chart.  [name] [Roflumilast] Nausea And Vomiting  . Erythromycin Other (See Comments)    PATIENT CAN TOLERATE Z-PAK.  It is written on patient's paper chart.  . Levofloxacin Nausea And Vomiting    Current Outpatient Medications on File Prior to Visit  Medication Sig  . acetaminophen (TYLENOL) 325 MG tablet Take 2 tablets (650 mg total) by mouth every 6 (six) hours as needed for mild pain (or Fever >/= 101).  [name] albuterol (PROAIR HFA) 108 (90 Base) MCG/ACT inhaler Inhale 2 puffs into the lungs every 6 (six) hours as needed for wheezing or shortness of breath.  . ALPRAZolam (XANAX) 0.5 MG tablet Take 1/2 to 1 tablet 2 to 3 x / day ONLY if Anxiety Attack and please try to limit to 5 days /week to avoid addiction  . amLODipine (NORVASC) 10 MG tablet TAKE ONE TABLET BY MOUTH AT BEDTIME  . budesonide-formoterol (SYMBICORT) 160-4.5 MCG/ACT inhaler Inhale 1 puff into the lungs 2 (two) times daily.  . Cholecalciferol (VITAMIN D3) 2000 UNITS TABS Take 10,000 Units by mouth daily.   . diclofenac sodium (VOLTAREN) 1 % GEL Apply 4 g topically 4 (four) times daily.  [name] dicyclomine (BENTYL) 10 MG capsule TAKE 1 CAPSULE BY MOUTH EVERY 8 HOURS AS NEEDED FOR SPASM  . gabapentin (NEURONTIN) 300 MG capsule Take 400 mg by mouth 3 (three) times daily.   [name]  ketoconazole (NIZORAL) 2 % cream APPLY 1 APPLICATION TOPICALLY TWO TIMES DAILY  . losartan (COZAAR) 100 MG tablet TAKE ONE TABLET BY MOUTH ONCE DAILY (DISCONTINUE  BENICAR)  . meclizine (ANTIVERT) 25 MG tablet 1/2-1 pill up to 3 times daily for motion sickness/dizziness  . metoprolol tartrate (LOPRESSOR) 50 MG tablet TAKE ONE TABLET BY MOUTH TWICE DAILY FOR  BLOOD  PRESSURE (Patient taking differently: TAKE ONE TABLET BY MOUTH IN THE EVENING FOR BLOOD  PRESSURE)  . neomycin-polymyxin b-dexamethasone (MAXITROL) 3.5-10000-0.1 OINT APPLY TO STYE FOUR TIMES DAILY.  . nitroGLYCERIN (NITROSTAT) 0.4 MG SL tablet Place 1 tablet (0.4 mg total) under the tongue every 5 (five) minutes as needed for chest pain.  [name] OVER THE COUNTER MEDICATION Take 1,000 mg  by mouth 2 (two) times daily. [place] Calcium 1000mg   . oxyCODONE-acetaminophen (PERCOCET/ROXICET) 5-325 MG tablet Take 1 tablet by mouth every 4 (four) hours as needed for severe pain.  . pantoprazole (PROTONIX) 40 MG tablet Take 1 tablet (40 mg total) daily by mouth.  . polyethylene glycol powder (GLYCOLAX/MIRALAX) powder Take 17 g by mouth 2 (two) times daily.  . potassium chloride SA (K-DUR,KLOR-CON) 20 MEQ tablet Take 1 tablet (20 mEq total) by mouth 2 (two) times daily.  . pravastatin (PRAVACHOL) 40 MG tablet Take 1 tablet (40 mg total) by mouth daily.  . promethazine (PHENERGAN) 25 MG tablet Take 1 tablet (25 mg total) every 6 (six) hours as needed by mouth for nausea or vomiting.  . risedronate (ACTONEL) 150 MG tablet Take 1 tablet (150 mg total) by mouth every 30 (thirty) days. with water on empty stomach, nothing by mouth or lie down for next 30 minutes.  . senna-docusate (SENOKOT-S) 8.6-50 MG tablet Take 2 tablets by mouth 2 (two) times daily.  . silodosin (RAPAFLO) 8 MG CAPS capsule Take 8 mg by mouth as needed.   [name] ipratropium-albuterol (DUONEB) 0.5-2.5 (3) MG/3ML SOLN USE ONE VIAL IN NEBULIZER 4 TIMES DAILY (Patient not taking: Reported on 08/09/2017)    No current facility-administered medications on file prior to visit.     ROS: Review of Systems  Constitutional: Positive for malaise/fatigue. Negative for chills, diaphoresis, fever and weight loss.  HENT: Positive for congestion. Negative for ear discharge, ear pain, sinus pain and sore throat.   Eyes: Negative.   Respiratory: Positive for cough, sputum production, shortness of breath (After coughing) and wheezing (Intermittent, after coughing).   Cardiovascular: Negative for chest pain, palpitations, orthopnea, claudication, leg swelling and PND.  Gastrointestinal: Negative for abdominal pain, nausea and vomiting.  Musculoskeletal: Negative for myalgias.  Skin: Negative for rash.  Neurological: Negative for dizziness and headaches.  Endo/Heme/Allergies: Negative for environmental allergies.     Physical Exam:  BP 130/84   Pulse 65   Temp (!) 97.5 F (36.4 C)   Ht 5' 3.5" (1.613 m)   Wt 160 lb (72.6 kg)   SpO2 96%   BMI 27.90 kg/m   General Appearance: Well nourished, in no apparent distress. Eyes: PERRLA, EOMs, conjunctiva no swelling or erythema Sinuses: No Frontal/maxillary tenderness ENT/Mouth: Ext aud canals clear, TMs without erythema, bulging. No erythema, swelling, or exudate on post pharynx.  Tonsils not swollen or erythematous. Hearing normal.  Neck: Supple.  Respiratory: Respiratory effort normal, BS with very coarse rhonchi throughout that do not clear with cough, without rales, wheezing or stridor.  Cardio: RRR with no MRGs. Brisk peripheral pulses without edema.  Abdomen: Soft, + BS. Non-distended, non-tender.  Lymphatics: Non tender without lymphadenopathy.  Musculoskeletal: normal gait.  Skin: Warm, dry without rashes, lesions, ecchymosis.  Psych: Awake and oriented X 3, normal affect, Insight and Judgment appropriate.     Izora Ribas, NP 11:17 AM [facility]

## 2017-08-09 NOTE — Patient Instructions (Signed)

## 2017-08-14 ENCOUNTER — Other Ambulatory Visit: Payer: Self-pay | Admitting: Internal Medicine

## 2017-08-14 ENCOUNTER — Other Ambulatory Visit: Payer: Self-pay | Admitting: Adult Health

## 2017-08-14 ENCOUNTER — Telehealth (INDEPENDENT_AMBULATORY_CARE_PROVIDER_SITE_OTHER): Payer: Self-pay | Admitting: Specialist

## 2017-08-14 DIAGNOSIS — R450 Nervousness: Secondary | ICD-10-CM

## 2017-08-14 DIAGNOSIS — J42 Unspecified chronic bronchitis: Secondary | ICD-10-CM

## 2017-08-14 MED ORDER — ALPRAZOLAM 0.5 MG PO TABS: ORAL_TABLET | ORAL | 0 refills | Status: DC

## 2017-08-14 NOTE — Telephone Encounter (Signed)
Patient states she saw her primary care doctor and he told her she has a compressed fracture of her L1. She would like Dr. Louanne Skye to review the xrays.---She had a chest xray done on 08/09/2017-----Please advise

## 2017-08-14 NOTE — Telephone Encounter (Signed)
Patient states she saw her primary care doctor and he told her she has a compressed fracture of her L1. She would like Dr. Louanne Skye to review the xrays. CB # [phone]

## 2017-08-30 ENCOUNTER — Telehealth: Payer: Self-pay | Admitting: *Deleted

## 2017-08-30 NOTE — Telephone Encounter (Signed)
The patient called and reported red, bruise-like areas on her legs. She asked if she can reduce the amount of ASA she takes a week.  Per Dr Melford Aase, he does not recommend she change her ASA due to the possibility of stroke.  The patient is aware.

## 2017-09-01 DIAGNOSIS — G894 Chronic pain syndrome: Secondary | ICD-10-CM | POA: Diagnosis not present

## 2017-09-01 DIAGNOSIS — M4726 Other spondylosis with radiculopathy, lumbar region: Secondary | ICD-10-CM | POA: Diagnosis not present

## 2017-09-01 DIAGNOSIS — M961 Postlaminectomy syndrome, not elsewhere classified: Secondary | ICD-10-CM | POA: Diagnosis not present

## 2017-09-01 DIAGNOSIS — Z79891 Long term (current) use of opiate analgesic: Secondary | ICD-10-CM | POA: Diagnosis not present

## 2017-09-04 ENCOUNTER — Other Ambulatory Visit: Payer: Self-pay | Admitting: Internal Medicine

## 2017-09-04 ENCOUNTER — Encounter: Payer: Self-pay | Admitting: Internal Medicine

## 2017-09-04 ENCOUNTER — Ambulatory Visit (INDEPENDENT_AMBULATORY_CARE_PROVIDER_SITE_OTHER): Payer: PPO | Admitting: Internal Medicine

## 2017-09-04 VITALS — BP 136/64 | HR 72 | Temp 97.9°F | Resp 18 | Ht 63.5 in | Wt 166.6 lb

## 2017-09-04 DIAGNOSIS — D692 Other nonthrombocytopenic purpura: Secondary | ICD-10-CM | POA: Diagnosis not present

## 2017-09-04 DIAGNOSIS — R233 Spontaneous ecchymoses: Secondary | ICD-10-CM | POA: Diagnosis not present

## 2017-09-04 DIAGNOSIS — T887XXA Unspecified adverse effect of drug or medicament, initial encounter: Secondary | ICD-10-CM | POA: Diagnosis not present

## 2017-09-04 LAB — CBC WITH DIFFERENTIAL/PLATELET
BASOS ABS: 40 {cells}/uL (ref 0–200)
BASOS PCT: 0.6 %
Eosinophils Absolute: 101 cells/uL (ref 15–500)
Eosinophils Relative: 1.5 %
HCT: 44.2 % (ref 35.0–45.0)
HEMOGLOBIN: 15.2 g/dL (ref 11.7–15.5)
LYMPHS ABS: 1615 {cells}/uL (ref 850–3900)
MCH: 32.1 pg (ref 27.0–33.0)
MCHC: 34.4 g/dL (ref 32.0–36.0)
MCV: 93.2 fL (ref 80.0–100.0)
MONOS PCT: 8.9 %
MPV: 11 fL (ref 7.5–12.5)
NEUTROS ABS: 4348 {cells}/uL (ref 1500–7800)
Neutrophils Relative %: 64.9 %
Platelets: 272 10*3/uL (ref 140–400)
RBC: 4.74 10*6/uL (ref 3.80–5.10)
RDW: 12.8 % (ref 11.0–15.0)
Total Lymphocyte: 24.1 %
WBC mixed population: 596 cells/uL (ref 200–950)
WBC: 6.7 10*3/uL (ref 3.8–10.8)

## 2017-09-04 MED ORDER — PROMETHAZINE HCL 25 MG PO TABS: 25.0000 mg | ORAL_TABLET | Freq: Four times a day (QID) | ORAL | 1 refills | Status: DC | PRN

## 2017-09-04 NOTE — Patient Instructions (Signed)
Hematoma A hematoma is a collection of blood under the skin, in an organ, in a body space, in a joint space, or in other tissue. The blood can thicken (clot) to form a lump that you can see and feel. The lump is often firm and may become sore and tender. Most hematomas get better in a few days to weeks. However, some hematomas may be serious and require medical care. Hematomas can range from very small to very large. What are the causes? This condition is caused by:  A blunt or penetrating injury.  A leakage from a blood vessel under the skin. This leak happens on its own (is spontaneous) and is more likely to occur in older people, especially those who take blood thinners.  Some medical procedures including surgeries, such as oral surgery, face lifts, and surgeries that involve the joints.  Some medical conditions that cause bleeding or bruising problems. There may be multiple hematomas that appear in different areas of the body.  What are the signs or symptoms? Symptoms of this condition can depend on where the hematoma is located. Common symptoms of a hematoma under the skin include:  A firm lump on the body.  Pain and tenderness in the area.  Bruising. Blue, dark blue, purple-red, or yellowish skin (discoloration) may appear at the site of the hematoma if the hematoma is close to the surface of the skin.  For hematomas in deeper tissues or body spaces, symptoms may be less obvious. A collection of blood in the stomach (intra-abdominal hematoma) may cause pain in the abdomen, weakness, fainting, and shortness of breath. A collection of blood in the head (intracranial hematoma) may cause a headache or symptoms such as weakness, trouble speaking or understanding, or a change in consciousness. How is this diagnosed? This condition is diagnosed based on:  Your medical history.  A physical exam.  Imaging tests, such as an ultrasonogram or CT scan. These may be needed if your health care  provider suspects a hematoma in deeper tissues or body spaces.  Blood tests. These may be needed if your health care provider believes that the hematoma is caused by a medical condition.  How is this treated? This condition usually does not need treatment because many hematomas go away on their own over time. However, large hematomas, or those that may affect vital organs, may need surgical drainage or monitoring. If the hematoma is caused by a medical condition, medicines may be prescribed. Follow these instructions at home: Managing pain, stiffness, and swelling  If directed, apply ice to the affected area: ? Put ice in a plastic bag. ? Place a towel between your skin and the bag. ? Leave the ice on for 20 minutes, 2-3 times a day for the first couple of days.  After applying ice for a couple of days, your health care provider may recommend that you apply warm compresses to the affected area instead. Do this as told by your health care provider. Remove the heat if your skin turns bright red. This is especially important if you are unable to feel pain, heat, or cold. You may have a greater risk of getting burned  Raise (elevate) the affected area above the level of your heart while you are sitting or lying down.  Wrap the affected area with an elastic bandage, if told by your health care provider. The bandage applies pressure (compression) to the area, which may help to reduce swelling and help the hematoma heal. Make sure the   bandage is not wrapped too tight.  If your hematoma is on a leg or foot (lower extremity) and is painful, your health care provider may recommend crutches. Use them as told by your health care provider. General instructions  Take over-the-counter and prescription medicines only as told by your health care provider.  Keep all follow-up visits as told by your health care provider. This is important. Contact a health care provider if:  You have a fever.  The  swelling or discoloration gets worse.  You develop more hematomas. Get help right away if:  Your pain is worse or your pain is not controlled with medicine.  Your skin over the hematoma breaks or starts bleeding.  Your hematoma is in your chest or abdomen and you have weakness, shortness of breath, or a change in consciousness.  You have a hematoma on your scalp caused by a fall or injury and you have a headache that gets worse, trouble speaking or understanding, weakness, or a change in alertness or consciousness. Summary  A hematoma is a collection of blood under the skin, in an organ, in a body space, in a joint space, or in other tissue.  This condition usually does not need treatment because many hematomas go away on their own over time.  Large hematomas, or those that may affect vital organs, may need surgical drainage or monitoring. If the hematoma is caused by a medical condition, medicines may be prescribed. This information is not intended to replace advice given to you by your health care provider. Make sure you discuss any questions you have with your health care provider. Document Released: 12/08/2003 Document Revised: 05/28/2016 Document Reviewed: 05/28/2016 Elsevier Interactive Patient Education  2018 [employer]  

## 2017-09-04 NOTE — Progress Notes (Signed)
Subjective:    Patient ID: [name] [name], female    DOB: [birth year], 82 y.o.   MRN: [id number]  HPI  This nice 82 yo MWF presents for evaluation of "red " spots of her extremities. She is on LD bASA daily and does admit "bumping" her arms. Denies blood in BM's, urine or bleeding from gums.   Medication Sig  . acetaminophen (TYLENOL) 325 MG tablet Take 2 tablets (650 mg total) by mouth every 6 (six) hours as needed for mild pain (or Fever >/= 101).  [name] albuterol (PROAIR HFA) 108 (90 Base) MCG/ACT inhaler Inhale 2 puffs into the lungs every 6 (six) hours as needed for wheezing or shortness of breath.  . ALPRAZolam (XANAX) 0.5 MG tablet Take 1/2 to 1 tablet 2 to 3 x / day ONLY if Anxiety Attack and please try to limit to 5 days /week to avoid addiction  . amLODipine (NORVASC) 10 MG tablet TAKE ONE TABLET BY MOUTH AT BEDTIME  . budesonide-formoterol (SYMBICORT) 160-4.5 MCG/ACT inhaler Inhale 1 puff into the lungs 2 (two) times daily.  . Cholecalciferol (VITAMIN D3) 2000 UNITS TABS Take 10,000 Units by mouth daily.   . diclofenac sodium (VOLTAREN) 1 % GEL Apply 4 g topically 4 (four) times daily.  [name] dicyclomine (BENTYL) 10 MG capsule TAKE 1 CAPSULE BY MOUTH EVERY 8 HOURS AS NEEDED FOR SPASM  . gabapentin (NEURONTIN) 300 MG capsule Take 400 mg by mouth 3 (three) times daily.   [name] ipratropium-albuterol (DUONEB) 0.5-2.5 (3) MG/3ML SOLN USE ONE VIAL IN NEBULIZER 4 TIMES DAILY  . ketoconazole (NIZORAL) 2 % cream APPLY 1 APPLICATION TOPICALLY TWO TIMES DAILY  . losartan (COZAAR) 100 MG tablet TAKE ONE TABLET BY MOUTH ONCE DAILY (DISCONTINUE  BENICAR)  . meclizine (ANTIVERT) 25 MG tablet 1/2-1 pill up to 3 times daily for motion sickness/dizziness  . metoprolol tartrate (LOPRESSOR) 50 MG tablet TAKE ONE TABLET BY MOUTH TWICE DAILY FOR  BLOOD  PRESSURE (Patient taking differently: TAKE ONE TABLET BY MOUTH IN THE EVENING FOR BLOOD  PRESSURE)  . neomycin-polymyxin b-dexamethasone (MAXITROL) 3.5-10000-0.1 OINT  APPLY TO STYE FOUR TIMES DAILY.  . nitroGLYCERIN (NITROSTAT) 0.4 MG SL tablet Place 1 tablet (0.4 mg total) under the tongue every 5 (five) minutes as needed for chest pain.  [name] OVER THE COUNTER MEDICATION Take 1,000 mg by mouth 2 (two) times daily. Coral Calcium 1000mg   . oxyCODONE-acetaminophen (PERCOCET/ROXICET) 5-325 MG tablet Take 1 tablet by mouth every 4 (four) hours as needed for severe pain.  . pantoprazole (PROTONIX) 40 MG tablet Take 1 tablet (40 mg total) daily by mouth.  . polyethylene glycol powder (GLYCOLAX/MIRALAX) powder Take 17 g by mouth 2 (two) times daily.  . potassium chloride SA (K-DUR,KLOR-CON) 20 MEQ tablet Take 1 tablet (20 mEq total) by mouth 2 (two) times daily.  . pravastatin (PRAVACHOL) 40 MG tablet Take 1 tablet (40 mg total) by mouth daily.  . promethazine (PHENERGAN) 25 MG tablet Take 1 tablet (25 mg total) every 6 (six) hours as needed by mouth for nausea or vomiting.  . risedronate (ACTONEL) 150 MG tablet Take 1 tablet (150 mg total) by mouth every 30 (thirty) days. with water on empty stomach, nothing by mouth or lie down for next 30 minutes.  . senna-docusate (SENOKOT-S) 8.6-50 MG tablet Take 2 tablets by mouth 2 (two) times daily.  . silodosin (RAPAFLO) 8 MG CAPS capsule Take 8 mg by mouth as needed.    No facility-administered medications prior to visit.  Allergies  Allergen Reactions  . Ace Inhibitors Cough    Per Dr Melvyn Novas pulmonology 2013  . Aspartame Diarrhea and Nausea And Vomiting  . Atorvastatin Other (See Comments)    Muscle aches  . Biaxin [Clarithromycin] Other (See Comments)    PATIENT CAN TOLERATE Z-PAK.  Is written on patient's paper chart.  [name] [Roflumilast] Nausea And Vomiting  . Erythromycin Other (See Comments)    PATIENT CAN TOLERATE Z-PAK.  It is written on patient's paper chart.  . Levofloxacin Nausea And Vomiting   Past Medical History:  Diagnosis Date  . Anxiety    takes Xanax daily as needed  . Asthma    Albuterol  daily as needed  . Blood transfusion without reported diagnosis   . CAD (coronary artery disease)    LHC 2003 with 40% pLAD, 30% mLAD, 100% D1 (moderate vessel), 100%  D2 (small vessel), 95% mid small OM2.  Pt had PTCA to D1;  D2 and OM2 small vessels and tx medically;  Last Myoview (2012): anterior infarct seen with scar, no ischemia. EF normal. Pt managed medically;  Lexiscan Myoview (12/14):  Low risk; ant scar with very mild peri-infarct ischemia; EF 55% with ant AK   . Cataract   . Chronic back pain    HNP, DDD, spinal stenosis, vertebral compression fractures.   . Chronic nausea    takes Zofran daily as needed.  normal gastric emptying study 03/2013  . Constipation    felt to be functional. takes Senokot and Miralax daily.  treated with Linzess in past.   . DIVERTICULOSIS, COLON 08/28/2008   Qualifier: Diagnosis of  By: [name], [name], [name]    . Elevated LFTs 2015   probably med induced DILI, from Augmentin vs Pravastatin  . GERD (gastroesophageal reflux disease)    hx of esophageal stricture   . Hiatal hernia    Paraesophageal hernias. s/p fundoplications in 6270 and 04/2013  . History of bronchitis several of yrs ago  . History of colon polyps 03/2009, 03/2011   adenomatous colon polyps, no high grade dysplasia.   [name] History of vertigo    no meds  . Hyperlipidemia    takes Pravastatin daily  . Hypertension    takes Amlodipine,Metoprolol,and Losartan daily  . Nausea 03/2016  . Osteoporosis   . PONV (postoperative nausea and vomiting)    yrs ago  . Slow urinary stream    takes Rapaflo daily  . TIA (transient ischemic attack) 1995   no residual effects noted   Past Surgical History:  Procedure Laterality Date  . APPENDECTOMY     patient unsure of date  . BACK SURGERY    . BLADDER REPAIR    . CHOLECYSTECTOMY     Patient unsure of date.  . CORONARY ANGIOPLASTY  11/16/2001   1 stent  . ESOPHAGEAL MANOMETRY N/A 03/25/2013   Procedure: ESOPHAGEAL MANOMETRY (EM);   Surgeon: Inda Castle, MD;  Location: [facility];  Service: Endoscopy;  Laterality: N/A;  . ESOPHAGOGASTRODUODENOSCOPY N/A 03/15/2013   Procedure: ESOPHAGOGASTRODUODENOSCOPY (EGD);  Surgeon: Jerene Bears, MD;  Location: [facility] ENDOSCOPY;  Service: Endoscopy;  Laterality: N/A;  . ESOPHAGOGASTRODUODENOSCOPY N/A 12/25/2015   Procedure: ESOPHAGOGASTRODUODENOSCOPY (EGD);  Surgeon: Doran Stabler, MD;  Location[facility] ENDOSCOPY;  Service: Endoscopy;  Laterality: N/A;  . EYE SURGERY  11/2000   bilateral cataracts with lens implant  . FIXATION KYPHOPLASTY LUMBAR SPINE  X 2   "L3-4"  . HEMORRHOID SURGERY    . HIATAL HERNIA  REPAIR  06/03/2011   Procedure: LAPAROSCOPIC REPAIR OF HIATAL HERNIA;  Surgeon: Adin Hector, MD;  Location: [facility];  Service: General;  Laterality: N/A;  . HIATAL HERNIA REPAIR N/A 04/24/2013   Procedure: LAPAROSCOPIC  REPAIR RECURRENT PARASOPHAGEAL HIATAL HERNIA WITH FUNDOPLICATION;  Surgeon: Adin Hector, MD;  Location: [facility];  Service: General;  Laterality: N/A;  . INSERTION OF MESH N/A 04/24/2013   Procedure: INSERTION OF MESH;  Surgeon: Adin Hector, MD;  Location: [facility];  Service: General;  Laterality: N/A;  . LAPAROSCOPIC LYSIS OF ADHESIONS N/A 04/24/2013   Procedure: LAPAROSCOPIC LYSIS OF ADHESIONS;  Surgeon: Adin Hector, MD;  Location: [facility];  Service: General;  Laterality: N/A;  . LAPAROSCOPIC NISSEN FUNDOPLICATION  08/15/8117   Procedure: LAPAROSCOPIC NISSEN FUNDOPLICATION;  Surgeon: Adin Hector, MD;  Location: [facility];  Service: General;  Laterality: N/A;  . LUMBAR FUSION  12/07/2015   Right L2-3 facetectomy, posterolateral fusion L2-3 with pedicle screws, rods, local bone graft, Vivigen cancellous chips. Fusion extended to the L1 level with pedicle screws and rods  . LUMBAR LAMINECTOMY/DECOMPRESSION MICRODISCECTOMY Right 06/26/2012   Procedure: LUMBAR LAMINECTOMY/DECOMPRESSION MICRODISCECTOMY;  Surgeon: Jessy Oto, MD;  Location: [place];  Service:  Orthopedics;  Laterality: Right;  RIGHT L4-5 MICRODISCECTOMY  . LUMBAR LAMINECTOMY/DECOMPRESSION MICRODISCECTOMY N/A 05/29/2015   Procedure: RIGHT L2-3 MICRODISCECTOMY;  Surgeon: Jessy Oto, MD;  Location: [place];  Service: Orthopedics;  Laterality: N/A;  . TOTAL ABDOMINAL HYSTERECTOMY  1975   partial   Review of Systems  10 point systems review negative except as above.    Objective:   Physical Exam  BP 136/64   Pulse 72   Temp 97.9 F (36.6 C)   Resp 18   Ht 5' 3.5" (1.613 m)   Wt 166 lb 9.6 oz (75.6 kg)   BMI 29.05 kg/m   HEENT - WNL. Neck - supple.  Chest - Clear equal BS. Cor - Nl HS. RRR w/o sig MGR. PP 1(+). No edema. MS- FROM w/o deformities.  Gait Nl. Neuro -  Nl w/o focal abnormalities. Skin- fer scatter ecchymoses of Rt ant shin, Lt post calf and dorsal Ly forearm ranging in size from several cm's up to 10 x 12 cm's. No petechiae noted.     Assessment & Plan:   1. Senile purpura (HCC)  2. Spontaneous ecchymoses  3. Medication side effects  - CBC with Differential/Platelet - Protime-INR  - Advised stop her LD bASA pending labs

## 2017-09-05 LAB — PROTIME-INR
INR: 0.9
PROTHROMBIN TIME: 10 s (ref 9.0–11.5)

## 2017-09-08 ENCOUNTER — Other Ambulatory Visit: Payer: Self-pay | Admitting: Internal Medicine

## 2017-09-08 ENCOUNTER — Encounter: Payer: Self-pay | Admitting: Internal Medicine

## 2017-09-08 ENCOUNTER — Ambulatory Visit (INDEPENDENT_AMBULATORY_CARE_PROVIDER_SITE_OTHER): Payer: PPO | Admitting: Internal Medicine

## 2017-09-08 VITALS — BP 122/70 | HR 83 | Ht 63.0 in | Wt 164.0 lb

## 2017-09-08 DIAGNOSIS — R0609 Other forms of dyspnea: Secondary | ICD-10-CM | POA: Diagnosis not present

## 2017-09-08 DIAGNOSIS — R0602 Shortness of breath: Secondary | ICD-10-CM | POA: Insufficient documentation

## 2017-09-08 DIAGNOSIS — J4520 Mild intermittent asthma, uncomplicated: Secondary | ICD-10-CM | POA: Diagnosis not present

## 2017-09-08 DIAGNOSIS — K219 Gastro-esophageal reflux disease without esophagitis: Secondary | ICD-10-CM | POA: Diagnosis not present

## 2017-09-08 MED ORDER — AMLODIPINE BESYLATE 10 MG PO TABS: ORAL_TABLET | ORAL | 3 refills | Status: DC

## 2017-09-08 MED ORDER — MOMETASONE FURO-FORMOTEROL FUM 100-5 MCG/ACT IN AERO: 2.0000 | INHALATION_SPRAY | Freq: Two times a day (BID) | RESPIRATORY_TRACT | 0 refills | Status: DC

## 2017-09-08 NOTE — Progress Notes (Signed)
Subjective:   Patient ID: [name] [name], female    DOB: [birth year]    MRN: [id number]     Brief patient profile:  9 yowf never smoker with large hiatal hernia & recurrent asthmatic bronchitis , about twice/yr for many yrs. Lung parenchyma looks ok on Ct scans 10/2006 & 12/10. PFTs nml in 12/09 & mild airway obstruction in 08/2009.  First presented 3/09 . CT neck for thyroid imaging s/o mediastinal contour abnormal. Ct chest >> large hiatal hernia & BL upper lobe hazy , ground glass opacity.  Dec, 2010  [name] [name])  Fe def anemia requiring transfusions, small bowel AVMs suspected.  Mar 2011 Acute visit- c/o cough and wheezing >> augmentin helped, enalapril changed to diovan       11/19/2013 acute  ov/[name] [name] re:  [name] [name] since July 4th 2015 w/e Chief Complaint  Patient presents with  . Acute Visit    Pt c/o cough, congestion and sinus pressure x 1 wk. Cough is prod with minimal to moderate greenish yellow sputum.     symptoms were acute "like a head cold" then into chest, progressive, with sob with exertion but not at rest Zpak July 10  [facility] 2 weeks prior to OV   got kyphoplasty, then sinus congestion Used neb am of of with good results  rec Augmentin 875 mg take one pill twice daily  X 10 days - take at breakfast and supper with large glass of water.  It would help reduce the usual side effects (diarrhea and yeast infections) if you ate cultured yogurt at lunch.  Prednisone 10 mg take  4 each am x 2 days,   2 each am x 2 days,  1 each am x 2 days and stop  Work on inhaler technique:  relax and gently blow all the way out then take a nice smooth deep breath back in, triggering the inhaler at same time you start breathing in.  Hold for up to 5 seconds if you can.  Rinse and gargle with water when done Return in 2 weeks if not completely better   > did not return      09/08/2017 1st [place] [facility] office visit/ [name] [name]  Re AB  Chief Complaint  Patient presents with  . Pulmonary Consult    Last  seen here in July 2015. She states that she is here for just "check up". Breathing is overall doing well.  She rarely uses her albuterol inhaler.   uses dulera 200 when over does it  Sleeping s resp problems  No cough   Walks at [employer] at a slow pace with a cane and not really limited by doe    No  obvious day to day or daytime variability or assoc excess/ purulent sputum or mucus plugs or hemoptysis or cp or chest tightness, subjective wheeze or overt sinus or hb symptoms. No unusual exposure hx or h/o childhood pna/ asthma or knowledge of premature birth.  Sleeping ok flat without nocturnal  or early am exacerbation  of respiratory  c/o's or need for noct saba. Also denies any obvious fluctuation of symptoms with weather or environmental changes or other aggravating or alleviating factors except as outlined above   Current Allergies, Complete Past Medical History, Past Surgical History, Family History, and Social History were reviewed in [employer] record.  ROS  The following are not active complaints unless bolded Hoarseness, sore throat, dysphagia, dental problems, itching, sneezing,  nasal congestion or discharge of excess mucus or purulent  secretions, ear ache,   fever, chills, sweats, unintended wt loss or wt gain, classically pleuritic or exertional cp,  orthopnea pnd or leg swelling, presyncope, palpitations, abdominal pain, anorexia, nausea, vomiting, diarrhea  or change in bowel habits or change in bladder habits, change in stools or change in urine, dysuria, hematuria,  rash, arthralgias, visual complaints, headache, numbness, weakness or ataxia or problems with walking or coordination,  change in mood = depressed /affect or memory.          Current Meds  Medication Sig  . acetaminophen (TYLENOL) 325 MG tablet Take 2 tablets (650 mg total) by mouth every 6 (six) hours as needed for mild pain (or Fever >/= 101).  [name] albuterol (PROAIR HFA) 108 (90 Base) MCG/ACT  inhaler Inhale 2 puffs into the lungs every 6 (six) hours as needed for wheezing or shortness of breath.  . ALPRAZolam (XANAX) 0.5 MG tablet Take 1/2 to 1 tablet 2 to 3 x / day ONLY if Anxiety Attack and please try to limit to 5 days /week to avoid addiction  . amLODipine (NORVASC) 10 MG tablet TAKE ONE TABLET BY MOUTH AT BEDTIME  . Cholecalciferol (VITAMIN D3) 2000 UNITS TABS Take 10,000 Units by mouth daily.   . diclofenac sodium (VOLTAREN) 1 % GEL Apply 4 g topically 4 (four) times daily.  [name] dicyclomine (BENTYL) 10 MG capsule TAKE 1 CAPSULE BY MOUTH EVERY 8 HOURS AS NEEDED FOR SPASM  . gabapentin (NEURONTIN) 300 MG capsule Take 400 mg by mouth 3 (three) times daily.   [name] ipratropium-albuterol (DUONEB) 0.5-2.5 (3) MG/3ML SOLN USE ONE VIAL IN NEBULIZER 4 TIMES DAILY (Patient taking differently: USE ONE VIAL IN NEBULIZER 4 TIMES DAILY as needed)  . ketoconazole (NIZORAL) 2 % cream APPLY 1 APPLICATION TOPICALLY TWO TIMES DAILY  . losartan (COZAAR) 100 MG tablet TAKE ONE TABLET BY MOUTH ONCE DAILY (DISCONTINUE  BENICAR)  . meclizine (ANTIVERT) 25 MG tablet 1/2-1 pill up to 3 times daily for motion sickness/dizziness  . metoprolol tartrate (LOPRESSOR) 50 MG tablet TAKE ONE TABLET BY MOUTH TWICE DAILY FOR  BLOOD  PRESSURE (Patient taking differently: TAKE ONE TABLET BY MOUTH IN THE EVENING FOR BLOOD  PRESSURE)  . mometasone-formoterol (DULERA) 200-5 MCG/ACT AERO Inhale 2 puffs into the lungs 2 (two) times daily.  [name] neomycin-polymyxin b-dexamethasone (MAXITROL) 3.5-10000-0.1 OINT APPLY TO STYE FOUR TIMES DAILY.  . nitroGLYCERIN (NITROSTAT) 0.4 MG SL tablet Place 1 tablet (0.4 mg total) under the tongue every 5 (five) minutes as needed for chest pain.  [name] OVER THE COUNTER MEDICATION Take 1,000 mg by mouth 2 (two) times daily. Coral Calcium 1000mg   . oxyCODONE-acetaminophen (PERCOCET/ROXICET) 5-325 MG tablet Take 1 tablet by mouth every 4 (four) hours as needed for severe pain.  . pantoprazole (PROTONIX) 40  MG tablet Take 1 tablet (40 mg total) daily by mouth.  . polyethylene glycol powder (GLYCOLAX/MIRALAX) powder Take 17 g by mouth 2 (two) times daily.  . pravastatin (PRAVACHOL) 40 MG tablet Take 1 tablet (40 mg total) by mouth daily.  . promethazine (PHENERGAN) 25 MG tablet Take 1 tablet (25 mg total) by mouth every 6 (six) hours as needed for nausea or vomiting.  . risedronate (ACTONEL) 150 MG tablet Take 1 tablet (150 mg total) by mouth every 30 (thirty) days. with water on empty stomach, nothing by mouth or lie down for next 30 minutes.  . senna-docusate (SENOKOT-S) 8.6-50 MG tablet Take 2 tablets by mouth 2 (two) times daily.  . silodosin (RAPAFLO) 8  MG CAPS capsule Take 8 mg by mouth as needed.                Objective:   Physical Exam  amb wf nad   09/08/2017         164   11/19/13 132 lb (59.875 kg)  11/14/13 127 lb (57.607 kg)  11/05/13 125 lb (56.7 kg)       HEENT: nl , turbinates bilaterally, and oropharynx. Nl external ear canals without cough reflex - full dentures    NECK :  without JVD/Nodes/TM/ nl carotid upstrokes bilaterally   LUNGS: no acc muscle use,  Nl contour chest which is clear to A and P bilaterally without cough on insp or exp maneuvers   CV:  RRR  no s3 or murmur or increase in P2, and no edema   ABD:  Obese/ soft and nontender with late pos hoover's  in the supine position. No bruits or organomegaly appreciated, bowel sounds nl  MS:  Nl gait/ ext warm without deformities, calf tenderness, cyanosis or clubbing No obvious joint restrictions   SKIN: warm and dry without lesions    NEURO:  alert, approp, nl sensorium with  no motor or cerebellar deficits apparent.         Assessment & Plan:

## 2017-09-08 NOTE — Patient Instructions (Addendum)
In the event of cough/ wheeze or worse short of breath > dulera  100 or 200 up to 2 pffs every 12 hours   Work on inhaler technique:  relax and gently blow all the way out then take a nice smooth deep breath back in, triggering the inhaler at same time you start breathing in.  Hold for up to 5 seconds if you can. Blow out thru nose. Rinse and gargle with water when done    Plan B = Backup Only use your albuterol as a rescue medication to be used if you can't catch your breath by resting or doing a relaxed purse lip breathing pattern.  - The less you use it, the better it will work when you need it. - Ok to use the inhaler up to 2 puffs  every 4 hours if you must but call for appointment if use goes up over your usual need - Don't leave home without it !!  (think of it like the spare tire for your car)   Plan C = Crisis - only use your albuterol nebulizer if you first try Plan B and it fails to help > ok to use the nebulizer up to every 4 hours but if start needing it regularly call for immediate appointment    Pulmonary follow up is as needed

## 2017-09-09 ENCOUNTER — Encounter: Payer: Self-pay | Admitting: Internal Medicine

## 2017-09-09 NOTE — Assessment & Plan Note (Addendum)
09/08/2017  After extensive coaching inhaler device  effectiveness =    75% > continue dulera 100 2 bid prn  - Spirometry 09/08/2017  wnl with min curvature off rx   Symptoms are intermittent and may be related to uri's or gerd   Based on the study from NEJM  378; 20 p 1865 (2018) in pts with mild asthma it is reasonable to use low dose symbicort eg 80 2bid(similar to dulera 100 which she already uses)  "prn" flare in this setting but I emphasized this was only shown with symbicort and takes advantage of the rapid onset of action but is not the same as "rescue therapy" but can be stopped once the acute symptoms have resolved and the need for rescue has been minimized (< 2 x weekly)     If not doing well with dulera 100 2bid then needs to return here   See device teaching which extended face to face time for this visit   Rule of two's reviewed:  If your breathing worsens or you need to use your rescue inhaler more than twice weekly or wake up more than twice a month with any respiratory symptoms or require more than two rescue inhalers per year, we need to see you right away because this means we're not controlling the underlying problem (inflammation) adequately.  Rescue inhalers (albuterol) do not control inflammation and overuse can lead to unnecessary and costly consequences.  They can make you feel better temporarily but eventually they will quit working effectively much as sleep aids lead to more insomnia if used regularly.    Total time devoted to counseling  > 50 % of initial 60 min office visit:  review case with pt/ discussion of options/alternatives/ personally creating written customized instructions  in presence of pt  then going over those specific  Instructions directly with the pt including how to use all of the meds but in particular covering each new medication in detail and the difference between the maintenance= "automatic" meds and the prns using an action plan format for the latter  (If this problem/symptom => do that organization reading Left to right).  Please see AVS from this visit for a full list of these instructions which I personally wrote for this pt and  are unique to this visit.   Pulmonary follow up is as needed

## 2017-09-09 NOTE — Assessment & Plan Note (Signed)
09/08/2017  Walked RA x 3 laps @ 185 ft each stopped due to  End of study, nl pace, no sob or desat   - Spirometry 09/08/2017  FEV1 1.72 (91%)  Ratio 76 with min curvature on no rx     Not limited by breathing from desired activities  So no further w/u needed

## 2017-09-09 NOTE — Assessment & Plan Note (Signed)
See EGD  12/25/15  S/p NF nl findings   She has chronic dysphagia that she says has not changed since last egd but not is also on actonel and ppi > if any worse would consider trial off biphonates then GI re-eval if not better

## 2017-09-14 ENCOUNTER — Ambulatory Visit: Payer: Self-pay | Admitting: Internal Medicine

## 2017-09-25 ENCOUNTER — Other Ambulatory Visit: Payer: Self-pay

## 2017-09-25 ENCOUNTER — Emergency Department (HOSPITAL_COMMUNITY): Payer: PPO

## 2017-09-25 ENCOUNTER — Inpatient Hospital Stay (HOSPITAL_COMMUNITY)
Admission: EM | Admit: 2017-09-25 | Discharge: 2017-09-30 | DRG: 479 | Disposition: A | Discharge status: Home / Self Care | Payer: PPO | Attending: Internal Medicine | Admitting: Internal Medicine

## 2017-09-25 ENCOUNTER — Encounter (HOSPITAL_COMMUNITY): Payer: Self-pay | Admitting: *Deleted

## 2017-09-25 DIAGNOSIS — Z955 Presence of coronary angioplasty implant and graft: Secondary | ICD-10-CM

## 2017-09-25 DIAGNOSIS — K219 Gastro-esophageal reflux disease without esophagitis: Secondary | ICD-10-CM | POA: Diagnosis present

## 2017-09-25 DIAGNOSIS — R609 Edema, unspecified: Secondary | ICD-10-CM | POA: Diagnosis not present

## 2017-09-25 DIAGNOSIS — K5909 Other constipation: Secondary | ICD-10-CM | POA: Diagnosis present

## 2017-09-25 DIAGNOSIS — E785 Hyperlipidemia, unspecified: Secondary | ICD-10-CM | POA: Diagnosis present

## 2017-09-25 DIAGNOSIS — D7589 Other specified diseases of blood and blood-forming organs: Secondary | ICD-10-CM | POA: Diagnosis not present

## 2017-09-25 DIAGNOSIS — M5414 Radiculopathy, thoracic region: Secondary | ICD-10-CM | POA: Diagnosis present

## 2017-09-25 DIAGNOSIS — Z8673 Personal history of transient ischemic attack (TIA), and cerebral infarction without residual deficits: Secondary | ICD-10-CM | POA: Diagnosis not present

## 2017-09-25 DIAGNOSIS — M8008XA Age-related osteoporosis with current pathological fracture, vertebra(e), initial encounter for fracture: Principal | ICD-10-CM | POA: Diagnosis present

## 2017-09-25 DIAGNOSIS — D72829 Elevated white blood cell count, unspecified: Secondary | ICD-10-CM | POA: Diagnosis present

## 2017-09-25 DIAGNOSIS — F419 Anxiety disorder, unspecified: Secondary | ICD-10-CM | POA: Diagnosis present

## 2017-09-25 DIAGNOSIS — M549 Dorsalgia, unspecified: Secondary | ICD-10-CM

## 2017-09-25 DIAGNOSIS — Z800 Family history of malignant neoplasm of digestive organs: Secondary | ICD-10-CM | POA: Diagnosis not present

## 2017-09-25 DIAGNOSIS — R600 Localized edema: Secondary | ICD-10-CM | POA: Diagnosis present

## 2017-09-25 DIAGNOSIS — Z981 Arthrodesis status: Secondary | ICD-10-CM

## 2017-09-25 DIAGNOSIS — M810 Age-related osteoporosis without current pathological fracture: Secondary | ICD-10-CM | POA: Diagnosis present

## 2017-09-25 DIAGNOSIS — N183 Chronic kidney disease, stage 3 (moderate): Secondary | ICD-10-CM | POA: Diagnosis present

## 2017-09-25 DIAGNOSIS — M545 Low back pain: Secondary | ICD-10-CM | POA: Diagnosis not present

## 2017-09-25 DIAGNOSIS — S22080G Wedge compression fracture of T11-T12 vertebra, subsequent encounter for fracture with delayed healing: Secondary | ICD-10-CM | POA: Diagnosis not present

## 2017-09-25 DIAGNOSIS — S22080A Wedge compression fracture of T11-T12 vertebra, initial encounter for closed fracture: Secondary | ICD-10-CM | POA: Diagnosis present

## 2017-09-25 DIAGNOSIS — F324 Major depressive disorder, single episode, in partial remission: Secondary | ICD-10-CM | POA: Diagnosis present

## 2017-09-25 DIAGNOSIS — M8000XD Age-related osteoporosis with current pathological fracture, unspecified site, subsequent encounter for fracture with routine healing: Secondary | ICD-10-CM | POA: Diagnosis not present

## 2017-09-25 DIAGNOSIS — Z8249 Family history of ischemic heart disease and other diseases of the circulatory system: Secondary | ICD-10-CM

## 2017-09-25 DIAGNOSIS — N182 Chronic kidney disease, stage 2 (mild): Secondary | ICD-10-CM | POA: Diagnosis present

## 2017-09-25 DIAGNOSIS — G8929 Other chronic pain: Secondary | ICD-10-CM | POA: Diagnosis present

## 2017-09-25 DIAGNOSIS — M5416 Radiculopathy, lumbar region: Secondary | ICD-10-CM | POA: Diagnosis present

## 2017-09-25 DIAGNOSIS — I2583 Coronary atherosclerosis due to lipid rich plaque: Secondary | ICD-10-CM | POA: Diagnosis present

## 2017-09-25 DIAGNOSIS — I129 Hypertensive chronic kidney disease with stage 1 through stage 4 chronic kidney disease, or unspecified chronic kidney disease: Secondary | ICD-10-CM | POA: Diagnosis present

## 2017-09-25 DIAGNOSIS — M7989 Other specified soft tissue disorders: Secondary | ICD-10-CM | POA: Diagnosis not present

## 2017-09-25 DIAGNOSIS — I2510 Atherosclerotic heart disease of native coronary artery without angina pectoris: Secondary | ICD-10-CM | POA: Diagnosis present

## 2017-09-25 DIAGNOSIS — M4854XA Collapsed vertebra, not elsewhere classified, thoracic region, initial encounter for fracture: Secondary | ICD-10-CM | POA: Diagnosis not present

## 2017-09-25 DIAGNOSIS — I10 Essential (primary) hypertension: Secondary | ICD-10-CM | POA: Diagnosis present

## 2017-09-25 DIAGNOSIS — Z808 Family history of malignant neoplasm of other organs or systems: Secondary | ICD-10-CM

## 2017-09-25 HISTORY — DX: Chronic kidney disease, stage 3 (moderate): N18.3

## 2017-09-25 LAB — CBC WITH DIFFERENTIAL/PLATELET
Abs Immature Granulocytes: 0 10*3/uL (ref 0.0–0.1)
BASOS ABS: 0.1 10*3/uL (ref 0.0–0.1)
BASOS PCT: 0 %
EOS ABS: 0.2 10*3/uL (ref 0.0–0.7)
Eosinophils Relative: 2 %
HCT: 46.4 % — ABNORMAL HIGH (ref 36.0–46.0)
Hemoglobin: 15.2 g/dL — ABNORMAL HIGH (ref 12.0–15.0)
IMMATURE GRANULOCYTES: 0 %
LYMPHS ABS: 2.1 10*3/uL (ref 0.7–4.0)
Lymphocytes Relative: 18 %
MCH: 31.4 pg (ref 26.0–34.0)
MCHC: 32.8 g/dL (ref 30.0–36.0)
MCV: 95.9 fL (ref 78.0–100.0)
Monocytes Absolute: 1.1 10*3/uL — ABNORMAL HIGH (ref 0.1–1.0)
Monocytes Relative: 10 %
NEUTROS PCT: 70 %
Neutro Abs: 8.2 10*3/uL — ABNORMAL HIGH (ref 1.7–7.7)
PLATELETS: 285 10*3/uL (ref 150–400)
RBC: 4.84 MIL/uL (ref 3.87–5.11)
RDW: 13.3 % (ref 11.5–15.5)
WBC: 11.8 10*3/uL — AB (ref 4.0–10.5)

## 2017-09-25 LAB — BASIC METABOLIC PANEL
ANION GAP: 14 (ref 5–15)
BUN: 13 mg/dL (ref 6–20)
CO2: 24 mmol/L (ref 22–32)
CREATININE: 0.94 mg/dL (ref 0.44–1.00)
Calcium: 9.7 mg/dL (ref 8.9–10.3)
Chloride: 103 mmol/L (ref 101–111)
GFR, EST NON AFRICAN AMERICAN: 56 mL/min — AB (ref >60)
Glucose, Bld: 92 mg/dL (ref 65–99)
Potassium: 3.8 mmol/L (ref 3.5–5.1)
SODIUM: 141 mmol/L (ref 135–145)

## 2017-09-25 MED ORDER — LORAZEPAM 2 MG/ML IJ SOLN
0.5000 mg | Freq: Once | INTRAMUSCULAR | Status: AC
  Administered 2017-09-25: 0.5 mg via INTRAVENOUS
  Filled 2017-09-25: qty 1

## 2017-09-25 MED ORDER — HYDROMORPHONE HCL 2 MG/ML IJ SOLN
1.0000 mg | Freq: Once | INTRAMUSCULAR | Status: AC
  Administered 2017-09-25: 1 mg via INTRAMUSCULAR
  Filled 2017-09-25: qty 1

## 2017-09-25 NOTE — ED Provider Notes (Signed)
Patient placed in Quick Look pathway, seen and evaluated   Chief Complaint: pain in legs and back  HPI:   Pt states bilateral leg swelling since yesterday and lower back pain onset today. No hx of leg pain or swelling before. Pt is in pain management and those meds not helping. No fever, chills, no urinary symptoms, no injuries.  Patient with multiple spinal fractures in the past, daughter is worried this is due to a new fracture.   ROS: back pain, leg pain  Physical Exam:   Gen: No distress  Neuro: Awake and Alert  Skin: Warm    Focused Exam: Patient is crying in pain. Tenderness to palpation over midline lumbar spine and right SI joint.  Pain with bilateral straight leg raise.  There is nonpitting edema to bilateral ankles and feet.  Distal pulses intact.  Capillary refill less than 2 seconds.    Initiation of care has begun. The patient has been counseled on the process, plan, and necessity for staying for the completion/evaluation, and the remainder of the medical screening examination  Patient with chronic back pain, here with what seems to be exacerbation of the pain.  She is crying and hollering in pain.  I will order her IM Dilaudid, she is worried about taking oral medications because she has not eaten.  She is neurovascularly intact.  She does have some swelling to the ankles and feet which she states is new.  I will get some basic labs, to evaluate kidney function, BNP, electrolytes, blood counts.   Vitals:   09/25/17 1808  BP: (!) 147/87  Pulse: 80  Resp: (!) 22  Temp: 98.3 F (36.8 C)  SpO2: 98%  Weight: 72.6 kg (160 lb)  Height: 5\' 5"  (1.651 m)      Jeannett Senior, PA-C 09/25/17 1838    Orpah Greek, MD 09/25/17 [phone]

## 2017-09-25 NOTE — ED Triage Notes (Signed)
Pt reports hx of back pain. Severe lower back pain since this am, radiates down right leg. Has pain to left shoulder and arm that increases with movement. Has nausea due to pain. No relief with pain meds at home.

## 2017-09-25 NOTE — ED Provider Notes (Signed)
[place] [facility] EMERGENCY DEPARTMENT Provider Note   CSN: [id number] Arrival date & time: 09/25/17  1703     History   Chief Complaint Chief Complaint  Patient presents with  . Back Pain    HPI [name] [name] is a 82 y.o. female.  Patient presents to the emergency department for evaluation of back pain.  Patient reports that pain began earlier this morning.  She has been experiencing severe, sharp and stabbing pain in the right lower back that radiates down the right leg.  She has had similar pains in the past, reports that she has had previous lumbar fractures as well as lumbar surgery.  No change in bowel or bladder function.  She is also concerned because she has had swelling for the last couple of days.     Past Medical History:  Diagnosis Date  . Anxiety    takes Xanax daily as needed  . Asthma    Albuterol daily as needed  . Blood transfusion without reported diagnosis   . CAD (coronary artery disease)    LHC 2003 with 40% pLAD, 30% mLAD, 100% D1 (moderate vessel), 100%  D2 (small vessel), 95% mid small OM2.  Pt had PTCA to D1;  D2 and OM2 small vessels and tx medically;  Last Myoview (2012): anterior infarct seen with scar, no ischemia. EF normal. Pt managed medically;  Lexiscan Myoview (12/14):  Low risk; ant scar with very mild peri-infarct ischemia; EF 55% with ant AK   . Cataract   . Chronic back pain    HNP, DDD, spinal stenosis, vertebral compression fractures.   . Chronic nausea    takes Zofran daily as needed.  normal gastric emptying study 03/2013  . Constipation    felt to be functional. takes Senokot and Miralax daily.  treated with Linzess in past.   . DIVERTICULOSIS, COLON 08/28/2008   Qualifier: Diagnosis of  By: [name], [name], [name]    . Elevated LFTs 2015   probably med induced DILI, from Augmentin vs Pravastatin  . GERD (gastroesophageal reflux disease)    hx of esophageal stricture   . Hiatal hernia    Paraesophageal hernias. s/p  fundoplications in 5732 and 04/2013  . History of bronchitis several of yrs ago  . History of colon polyps 03/2009, 03/2011   adenomatous colon polyps, no high grade dysplasia.   [name] History of vertigo    no meds  . Hyperlipidemia    takes Pravastatin daily  . Hypertension    takes Amlodipine,Metoprolol,and Losartan daily  . Nausea 03/2016  . Osteoporosis   . PONV (postoperative nausea and vomiting)    yrs ago  . Slow urinary stream    takes Rapaflo daily  . TIA (transient ischemic attack) 1995   no residual effects noted    Patient Active Problem List   Diagnosis Date Noted  . DOE (dyspnea on exertion) 09/08/2017  . Chronic bronchitis ([place]) 08/09/2017  . Atherosclerosis of aorta ([place]) 04/05/2017  . Costochondritis 11/14/2016  . Anxiety state 06/08/2016  . History of lumbar spinal fusion 04/13/2016  . Spondylolisthesis, lumbar region 04/13/2016  . Leukocytosis 03/28/2016  . Ileus ([place])   . Spinal stenosis, lumbar region, with neurogenic claudication 05/29/2015  . DDD (degenerative disc disease), lumbar 04/21/2015  . Mixed hyperlipidemia 11/24/2014  . GERD  11/24/2014  . Depression, major, in partial remission ([place]) 09/26/2014  . Chronic pain syndrome 09/26/2014  . Coronary artery disease due to lipid rich plaque   . Osteoporosis  02/10/2014  . Prediabetes 08/14/2013  . Vitamin D deficiency 08/14/2013  . Medication management 08/14/2013  . Constipation, chronic 04/03/2013  . MGUS (monoclonal gammopathy of unknown significance) 03/05/2013  . Herniated lumbar intervertebral disc 06/25/2012    Class: Acute  . Vertebral compression fracture ([place]) 06/23/2012  . ESOPHAGEAL STRICTURE 08/28/2008  . Mild intermittent asthma without complication 46/27/0350  . Incarcerated recurrent paraesophageal hiatal hernia s/p lap redo repair [id number] 01/31/2008  . Essential hypertension 07/25/2007    Past Surgical History:  Procedure Laterality Date  . APPENDECTOMY     patient unsure of  date  . BACK SURGERY    . BLADDER REPAIR    . CHOLECYSTECTOMY     Patient unsure of date.  . CORONARY ANGIOPLASTY  11/16/2001   1 stent  . ESOPHAGEAL MANOMETRY N/A 03/25/2013   Procedure: ESOPHAGEAL MANOMETRY (EM);  Surgeon: Inda Castle, MD;  Location: [facility] ENDOSCOPY;  Service: Endoscopy;  Laterality: N/A;  . ESOPHAGOGASTRODUODENOSCOPY N/A 03/15/2013   Procedure: ESOPHAGOGASTRODUODENOSCOPY (EGD);  Surgeon: Jerene Bears, MD;  Location: [facility] ENDOSCOPY;  Service: Endoscopy;  Laterality: N/A;  . ESOPHAGOGASTRODUODENOSCOPY N/A 12/25/2015   Procedure: ESOPHAGOGASTRODUODENOSCOPY (EGD);  Surgeon: Doran Stabler, MD;  Location: [facility] ENDOSCOPY;  Service: Endoscopy;  Laterality: N/A;  . EYE SURGERY  11/2000   bilateral cataracts with lens implant  . FIXATION KYPHOPLASTY LUMBAR SPINE  X 2   "L3-4"  . HEMORRHOID SURGERY    . HIATAL HERNIA REPAIR  06/03/2011   Procedure: LAPAROSCOPIC REPAIR OF HIATAL HERNIA;  Surgeon: Adin Hector, MD;  Location: [facility] ORS;  Service: General;  Laterality: N/A;  . HIATAL HERNIA REPAIR N/A 04/24/2013   Procedure: LAPAROSCOPIC  REPAIR RECURRENT PARASOPHAGEAL HIATAL HERNIA WITH FUNDOPLICATION;  Surgeon: Adin Hector, MD;  Location: [facility] ORS;  Service: General;  Laterality: N/A;  . INSERTION OF MESH N/A 04/24/2013   Procedure: INSERTION OF MESH;  Surgeon: Adin Hector, MD;  Location: [facility] ORS;  Service: General;  Laterality: N/A;  . LAPAROSCOPIC LYSIS OF ADHESIONS N/A 04/24/2013   Procedure: LAPAROSCOPIC LYSIS OF ADHESIONS;  Surgeon: Adin Hector, MD;  Location: [facility] ORS;  Service: General;  Laterality: N/A;  . LAPAROSCOPIC NISSEN FUNDOPLICATION  9/93/7169   Procedure: LAPAROSCOPIC NISSEN FUNDOPLICATION;  Surgeon: Adin Hector, MD;  Location: [facility] ORS;  Service: General;  Laterality: N/A;  . LUMBAR FUSION  12/07/2015   Right L2-3 facetectomy, posterolateral fusion L2-3 with pedicle screws, rods, local bone graft, Vivigen cancellous chips. Fusion extended to the L1  level with pedicle screws and rods  . LUMBAR LAMINECTOMY/DECOMPRESSION MICRODISCECTOMY Right 06/26/2012   Procedure: LUMBAR LAMINECTOMY/DECOMPRESSION MICRODISCECTOMY;  Surgeon: Jessy Oto, MD;  Location: [place];  Service: Orthopedics;  Laterality: Right;  RIGHT L4-5 MICRODISCECTOMY  . LUMBAR LAMINECTOMY/DECOMPRESSION MICRODISCECTOMY N/A 05/29/2015   Procedure: RIGHT L2-3 MICRODISCECTOMY;  Surgeon: Jessy Oto, MD;  Location: [place];  Service: Orthopedics;  Laterality: N/A;  . TOTAL ABDOMINAL HYSTERECTOMY  1975   partial     OB History   None      Home Medications    Prior to Admission medications   Medication Sig Start Date End Date Taking? Authorizing Provider  acetaminophen (TYLENOL) 325 MG tablet Take 2 tablets (650 mg total) by mouth every 6 (six) hours as needed for mild pain (or Fever >/= 101). 12/26/15  Yes Emokpae, Courage, MD  albuterol (PROAIR HFA) 108 (90 Base) MCG/ACT inhaler Inhale 2 puffs into the lungs every 6 (six) hours as needed for wheezing  or shortness of breath. 09/22/16  Yes Vicie Mutters, PA-C  ALPRAZolam Duanne Moron) 0.5 MG tablet Take 1/2 to 1 tablet 2 to 3 x / day ONLY if Anxiety Attack and please try to limit to 5 days /week to avoid addiction 08/14/17  Yes [name], MD  amLODipine (NORVASC) 10 MG tablet Take 1 tablet daily for BP Patient taking differently: Take 10 mg by mouth at bedtime.  09/08/17  Yes [name], MD  Cholecalciferol (VITAMIN D3) 2000 UNITS TABS Take 10,000 Units by mouth daily.    Yes [provider]  diclofenac sodium (VOLTAREN) 1 % GEL Apply 4 g topically 4 (four) times daily. 12/06/16  Yes Jessy Oto, MD  dicyclomine (BENTYL) 10 MG capsule TAKE 1 CAPSULE BY MOUTH EVERY 8 HOURS AS NEEDED FOR SPASM 08/04/17  Yes Armbruster, Carlota Raspberry, MD  gabapentin (NEURONTIN) 300 MG capsule Take 300 mg by mouth 3 (three) times daily.    Yes [provider]  ipratropium-albuterol (DUONEB) 0.5-2.5 (3) MG/3ML SOLN USE ONE VIAL IN  NEBULIZER 4 TIMES DAILY Patient taking differently: Take 3 mLs by nebulization every 6 (six) hours as needed (wheezing). USE ONE VIAL IN NEBULIZER 4 TIMES DAILY as needed 08/09/17  Yes Corbett, Caryl Pina, NP  ketoconazole (NIZORAL) 2 % cream APPLY 1 APPLICATION TOPICALLY TWO TIMES DAILY 02/10/17  Yes Vicie Mutters, PA-C  losartan (COZAAR) 100 MG tablet TAKE ONE TABLET BY MOUTH ONCE DAILY (DISCONTINUE  BENICAR) 06/22/17  Yes [name], MD  meclizine (ANTIVERT) 25 MG tablet 1/2-1 pill up to 3 times daily for motion sickness/dizziness 06/08/16  Yes Vicie Mutters, PA-C  metoprolol tartrate (LOPRESSOR) 50 MG tablet TAKE ONE TABLET BY MOUTH TWICE DAILY FOR  BLOOD  PRESSURE Patient taking differently: TAKE ONE TABLET BY MOUTH IN THE EVENING FOR BLOOD  PRESSURE 04/02/17  Yes Vicie Mutters, PA-C  mometasone-formoterol (DULERA) 100-5 MCG/ACT AERO Inhale 2 puffs into the lungs every 12 (twelve) hours. 09/08/17  Yes [name], MD  neomycin-polymyxin b-dexamethasone (MAXITROL) 3.5-10000-0.1 OINT APPLY TO STYE FOUR TIMES DAILY. 07/07/17  Yes Vicie Mutters, PA-C  nitroGLYCERIN (NITROSTAT) 0.4 MG SL tablet Place 1 tablet (0.4 mg total) under the tongue every 5 (five) minutes as needed for chest pain. 09/04/14  Yes Larey Dresser, MD  OVER THE COUNTER MEDICATION Take 1,000 mg by mouth 2 (two) times daily. [place] Calcium 1000mg    Yes [provider]  oxyCODONE-acetaminophen (PERCOCET/ROXICET) 5-325 MG tablet Take 1 tablet by mouth every 4 (four) hours as needed for severe pain. 03/10/17  Yes Tegeler, Gwenyth Allegra, MD  pantoprazole (PROTONIX) 40 MG tablet Take 1 tablet (40 mg total) daily by mouth. 03/23/17  Yes Vicie Mutters, PA-C  polyethylene glycol powder (GLYCOLAX/MIRALAX) powder Take 17 g by mouth 2 (two) times daily. 05/16/17  Yes Armbruster, Carlota Raspberry, MD  pravastatin (PRAVACHOL) 40 MG tablet Take 1 tablet (40 mg total) by mouth daily. 07/31/17  Yes [name], MD  promethazine (PHENERGAN)  25 MG tablet Take 1 tablet (25 mg total) by mouth every 6 (six) hours as needed for nausea or vomiting. 09/04/17  Yes [name], MD  senna-docusate (SENOKOT-S) 8.6-50 MG tablet Take 2 tablets by mouth 2 (two) times daily. 12/26/15  Yes Emokpae, Courage, MD  silodosin (RAPAFLO) 8 MG CAPS capsule Take 8 mg by mouth daily with breakfast.    Yes [provider]  amLODipine (NORVASC) 10 MG tablet TAKE 1 TABLET BY MOUTH AT BEDTIME Patient not taking: Reported on 09/25/2017 09/08/17  [name], MD  potassium chloride SA (K-DUR,KLOR-CON) 20 MEQ tablet Take 1 tablet (20 mEq total) by mouth 2 (two) times daily. 11/25/14   [name], MD  risedronate (ACTONEL) 150 MG tablet Take 1 tablet (150 mg total) by mouth every 30 (thirty) days. with water on empty stomach, nothing by mouth or lie down for next 30 minutes. Patient not taking: Reported on 09/25/2017 07/25/17   [name], MD  tamsulosin (FLOMAX) 0.4 MG CAPS capsule Take 0.4 mg by mouth daily. 09/12/17   [provider]    Family History Family History  Problem Relation Age of Onset  . Colon cancer Mother 6  . Heart disease Father 88       heart attack  . Brain cancer Brother        tumor   . Heart disease Brother   . Heart disease Brother   . Kidney disease Daughter     Social History Social History   Tobacco Use  . Smoking status: Never Smoker  . Smokeless tobacco: Never Used  Substance Use Topics  . Alcohol use: No  . Drug use: No     Allergies   Ace inhibitors; Aspartame; Atorvastatin; Biaxin [clarithromycin]; Daliresp [roflumilast]; Erythromycin; and Levofloxacin   Review of Systems Review of Systems  Respiratory: Negative for shortness of breath.   Cardiovascular: Positive for leg swelling. Negative for chest pain and palpitations.  Musculoskeletal: Positive for back pain.  All other systems reviewed and are negative.    Physical Exam Updated Vital Signs BP 136/78   Pulse 82    Temp 98.4 F (36.9 C) (Oral)   Resp 16   Ht 5\' 5"  (1.651 m)   Wt 72.6 kg (160 lb)   SpO2 98%   BMI 26.63 kg/m   Physical Exam  Constitutional: She is oriented to person, place, and time. She appears well-developed and well-nourished. No distress.  HENT:  Head: Normocephalic and atraumatic.  Right Ear: Hearing normal.  Left Ear: Hearing normal.  Nose: Nose normal.  Mouth/Throat: Oropharynx is clear and moist and mucous membranes are normal.  Eyes: Pupils are equal, round, and reactive to light. Conjunctivae and EOM are normal.  Neck: Normal range of motion. Neck supple.  Cardiovascular: Regular rhythm, S1 normal and S2 normal. Exam reveals no gallop and no friction rub.  No murmur heard. Pulmonary/Chest: Effort normal and breath sounds normal. No respiratory distress. She exhibits no tenderness.  Abdominal: Soft. Normal appearance and bowel sounds are normal. There is no hepatosplenomegaly. There is no tenderness. There is no rebound, no guarding, no tenderness at McBurney's point and negative Murphy's sign. No hernia.  Musculoskeletal: Normal range of motion. She exhibits edema.  Diffuse lumbar tenderness and pain with range of motion  Neurological: She is alert and oriented to person, place, and time. She has normal strength. No cranial nerve deficit or sensory deficit. Coordination normal. GCS eye subscore is 4. GCS verbal subscore is 5. GCS motor subscore is 6.  Skin: Skin is warm, dry and intact. No rash noted. No cyanosis.  Psychiatric: She has a normal mood and affect. Her speech is normal and behavior is normal. Thought content normal.  Nursing note and vitals reviewed.    ED Treatments / Results  Labs (all labs ordered are listed, but only abnormal results are displayed) Labs Reviewed  CBC WITH DIFFERENTIAL/PLATELET - Abnormal; Notable for the following components:      Result Value   WBC 11.8 (*)    Hemoglobin 15.2 (*)  HCT 46.4 (*)    Neutro Abs 8.2 (*)     Monocytes Absolute 1.1 (*)    All other components within normal limits  BASIC METABOLIC PANEL - Abnormal; Notable for the following components:   GFR calc non Af Amer 56 (*)    All other components within normal limits  BRAIN NATRIURETIC PEPTIDE - Abnormal; Notable for the following components:   B Natriuretic Peptide 106.3 (*)    All other components within normal limits  URINALYSIS, ROUTINE W REFLEX MICROSCOPIC - Abnormal; Notable for the following components:   APPearance CLOUDY (*)    Ketones, ur 5 (*)    All other components within normal limits  BRAIN NATRIURETIC PEPTIDE    EKG None  Radiology Dg Lumbar Spine Complete  Result Date: 09/25/2017 CLINICAL DATA:  Back pain and leg swelling EXAM: LUMBAR SPINE - COMPLETE 4+ VIEW COMPARISON:  Lumbar spine radiograph 04/27/2017 . FINDINGS: There is posterior spinal fusion at L1-L3. Vertebral augmentation changes at L2-L4. No new compression fracture. Grade 1 L3-4 retrolisthesis and L4-5 anterolisthesis is unchanged IMPRESSION: Changes of prior lumbar fusion and multilevel vertebral augmentation without acute abnormality. Electronically Signed   By: Ulyses Jarred M.D.   On: 09/25/2017 21:19    Procedures Procedures (including critical care time)  Medications Ordered in ED Medications  promethazine (PHENERGAN) injection 12.5 mg (has no administration in time range)  HYDROmorphone (DILAUDID) injection 1 mg (1 mg Intramuscular Given 09/25/17 1838)  LORazepam (ATIVAN) injection 0.5 mg (0.5 mg Intravenous Given 09/25/17 2343)  HYDROmorphone (DILAUDID) injection 1 mg (1 mg Intravenous Given 09/26/17 0142)  HYDROmorphone (DILAUDID) injection 1 mg (1 mg Intravenous Given 09/26/17 0424)  ondansetron (ZOFRAN) injection 4 mg (4 mg Intravenous Given 09/26/17 0426)     Initial Impression / Assessment and Plan / ED Course  I have reviewed the triage vital signs and the nursing notes.  Pertinent labs & imaging results that were available during my  care of the patient were reviewed by me and considered in my medical decision making (see chart for details).     Patient presents to the emergency department for evaluation of back pain.  Patient reports pain that is in the lower back that radiates to the legs, predominantly to the right.  Pain is severe and unrelenting, significantly worsens if she moves at all.  She does have a history of back surgery.  Pain started earlier today, no known trauma.  She does not have obvious neurologic deficit on examination, but I have been unable to control her pain.  She will undergo lumbar MRI, but regardless of results, patient cannot be moved at all without severe pain, will require hospitalization for pain management.  Final Clinical Impressions(s) / ED Diagnoses   Final diagnoses:  Lumbar radiculopathy, acute    ED Discharge Orders    None       Orpah Greek, MD 09/26/17 [phone]

## 2017-09-26 ENCOUNTER — Encounter (HOSPITAL_COMMUNITY): Payer: Self-pay | Admitting: Internal Medicine

## 2017-09-26 ENCOUNTER — Observation Stay (HOSPITAL_COMMUNITY): Payer: PPO

## 2017-09-26 ENCOUNTER — Encounter (HOSPITAL_COMMUNITY): Payer: PPO

## 2017-09-26 DIAGNOSIS — K219 Gastro-esophageal reflux disease without esophagitis: Secondary | ICD-10-CM

## 2017-09-26 DIAGNOSIS — S22080G Wedge compression fracture of T11-T12 vertebra, subsequent encounter for fracture with delayed healing: Secondary | ICD-10-CM | POA: Diagnosis not present

## 2017-09-26 DIAGNOSIS — M5416 Radiculopathy, lumbar region: Secondary | ICD-10-CM | POA: Diagnosis not present

## 2017-09-26 DIAGNOSIS — I10 Essential (primary) hypertension: Secondary | ICD-10-CM | POA: Diagnosis not present

## 2017-09-26 DIAGNOSIS — S22080A Wedge compression fracture of T11-T12 vertebra, initial encounter for closed fracture: Secondary | ICD-10-CM | POA: Diagnosis present

## 2017-09-26 DIAGNOSIS — D72829 Elevated white blood cell count, unspecified: Secondary | ICD-10-CM | POA: Diagnosis not present

## 2017-09-26 LAB — SURGICAL PCR SCREEN
MRSA, PCR: NEGATIVE
STAPHYLOCOCCUS AUREUS: NEGATIVE

## 2017-09-26 LAB — CBC
HCT: 41.2 % (ref 36.0–46.0)
Hemoglobin: 13.5 g/dL (ref 12.0–15.0)
MCH: 31.8 pg (ref 26.0–34.0)
MCHC: 32.8 g/dL (ref 30.0–36.0)
MCV: 97.2 fL (ref 78.0–100.0)
Platelets: 251 K/uL (ref 150–400)
RBC: 4.24 MIL/uL (ref 3.87–5.11)
RDW: 13.4 % (ref 11.5–15.5)
WBC: 7.7 K/uL (ref 4.0–10.5)

## 2017-09-26 LAB — CREATININE, SERUM
Creatinine, Ser: 0.86 mg/dL (ref 0.44–1.00)
GFR calc Af Amer: >60 mL/min
GFR calc non Af Amer: >60 mL/min

## 2017-09-26 LAB — URINALYSIS, ROUTINE W REFLEX MICROSCOPIC
BILIRUBIN URINE: NEGATIVE
Glucose, UA: NEGATIVE mg/dL
HGB URINE DIPSTICK: NEGATIVE
Ketones, ur: 5 mg/dL — AB
Leukocytes, UA: NEGATIVE
Nitrite: NEGATIVE
PROTEIN: NEGATIVE mg/dL
Specific Gravity, Urine: 1.018 (ref 1.005–1.030)
pH: 8 (ref 5.0–8.0)

## 2017-09-26 LAB — BRAIN NATRIURETIC PEPTIDE: B NATRIURETIC PEPTIDE 5: 106.3 pg/mL — AB (ref 0.0–100.0)

## 2017-09-26 MED ORDER — POLYETHYLENE GLYCOL 3350 17 G PO PACK
17.0000 g | PACK | Freq: Two times a day (BID) | ORAL | Status: DC
  Administered 2017-09-26 – 2017-09-30 (×4): 17 g via ORAL
  Filled 2017-09-26 (×6): qty 1

## 2017-09-26 MED ORDER — ALPRAZOLAM 0.25 MG PO TABS
0.2500 mg | ORAL_TABLET | Freq: Three times a day (TID) | ORAL | Status: DC | PRN
  Administered 2017-09-26 – 2017-09-28 (×3): 0.25 mg via ORAL
  Filled 2017-09-26 (×3): qty 1

## 2017-09-26 MED ORDER — HYDROMORPHONE HCL 1 MG/ML IJ SOLN
0.5000 mg | INTRAMUSCULAR | Status: DC | PRN
Start: 2017-09-26 — End: 2017-09-29
  Administered 2017-09-26 – 2017-09-29 (×12): 0.5 mg via INTRAVENOUS
  Filled 2017-09-26 (×12): qty 1

## 2017-09-26 MED ORDER — LORAZEPAM 2 MG/ML IJ SOLN
0.2500 mg | Freq: Once | INTRAMUSCULAR | Status: AC
  Administered 2017-09-26: 0.25 mg via INTRAVENOUS
  Filled 2017-09-26: qty 1

## 2017-09-26 MED ORDER — ENOXAPARIN SODIUM 40 MG/0.4ML ~~LOC~~ SOLN: 40.0000 mg | SUBCUTANEOUS | Status: DC

## 2017-09-26 MED ORDER — HYDROMORPHONE HCL 2 MG/ML IJ SOLN
1.0000 mg | Freq: Once | INTRAMUSCULAR | Status: AC
  Administered 2017-09-26: 1 mg via INTRAVENOUS
  Filled 2017-09-26: qty 1

## 2017-09-26 MED ORDER — ONDANSETRON HCL 4 MG/2ML IJ SOLN
4.0000 mg | Freq: Once | INTRAMUSCULAR | Status: AC
  Administered 2017-09-26: 4 mg via INTRAVENOUS
  Filled 2017-09-26: qty 2

## 2017-09-26 MED ORDER — ALBUTEROL SULFATE (2.5 MG/3ML) 0.083% IN NEBU: 3.0000 mL | INHALATION_SOLUTION | Freq: Four times a day (QID) | RESPIRATORY_TRACT | Status: DC | PRN

## 2017-09-26 MED ORDER — METOPROLOL TARTRATE 50 MG PO TABS
50.0000 mg | ORAL_TABLET | Freq: Two times a day (BID) | ORAL | Status: DC
  Administered 2017-09-26 – 2017-09-30 (×9): 50 mg via ORAL
  Filled 2017-09-26 (×9): qty 1

## 2017-09-26 MED ORDER — AMLODIPINE BESYLATE 10 MG PO TABS
10.0000 mg | ORAL_TABLET | Freq: Every day | ORAL | Status: DC
  Administered 2017-09-26 – 2017-09-29 (×4): 10 mg via ORAL
  Filled 2017-09-26 (×4): qty 1

## 2017-09-26 MED ORDER — GABAPENTIN 300 MG PO CAPS
300.0000 mg | ORAL_CAPSULE | Freq: Three times a day (TID) | ORAL | Status: DC
  Administered 2017-09-27: 300 mg via ORAL
  Filled 2017-09-26 (×5): qty 1

## 2017-09-26 MED ORDER — SENNOSIDES-DOCUSATE SODIUM 8.6-50 MG PO TABS
2.0000 | ORAL_TABLET | Freq: Two times a day (BID) | ORAL | Status: DC
  Administered 2017-09-26 – 2017-09-29 (×4): 2 via ORAL
  Filled 2017-09-26 (×6): qty 2

## 2017-09-26 MED ORDER — ONDANSETRON HCL 4 MG/2ML IJ SOLN
4.0000 mg | Freq: Four times a day (QID) | INTRAMUSCULAR | Status: DC | PRN
  Administered 2017-09-26 – 2017-09-30 (×5): 4 mg via INTRAVENOUS
  Filled 2017-09-26 (×6): qty 2

## 2017-09-26 MED ORDER — ACETAMINOPHEN 325 MG PO TABS
650.0000 mg | ORAL_TABLET | Freq: Four times a day (QID) | ORAL | Status: DC | PRN
  Administered 2017-09-26 – 2017-09-28 (×3): 650 mg via ORAL
  Filled 2017-09-26 (×3): qty 2

## 2017-09-26 MED ORDER — HYDROMORPHONE HCL 2 MG/ML IJ SOLN
0.5000 mg | INTRAMUSCULAR | Status: DC | PRN
  Administered 2017-09-26: 0.5 mg via INTRAVENOUS
  Filled 2017-09-26: qty 1

## 2017-09-26 MED ORDER — DICLOFENAC SODIUM 1 % TD GEL
4.0000 g | Freq: Four times a day (QID) | TRANSDERMAL | Status: DC
  Administered 2017-09-26 – 2017-09-30 (×16): 4 g via TOPICAL
  Filled 2017-09-26: qty 100

## 2017-09-26 MED ORDER — TAMSULOSIN HCL 0.4 MG PO CAPS
0.4000 mg | ORAL_CAPSULE | Freq: Every day | ORAL | Status: DC
  Administered 2017-09-26 – 2017-09-29 (×4): 0.4 mg via ORAL
  Filled 2017-09-26 (×4): qty 1

## 2017-09-26 MED ORDER — PRAVASTATIN SODIUM 40 MG PO TABS
40.0000 mg | ORAL_TABLET | Freq: Every day | ORAL | Status: DC
  Administered 2017-09-26 – 2017-09-29 (×4): 40 mg via ORAL
  Filled 2017-09-26 (×4): qty 1

## 2017-09-26 MED ORDER — IPRATROPIUM-ALBUTEROL 0.5-2.5 (3) MG/3ML IN SOLN: 3.0000 mL | Freq: Four times a day (QID) | RESPIRATORY_TRACT | Status: DC | PRN

## 2017-09-26 MED ORDER — PROMETHAZINE HCL 25 MG/ML IJ SOLN
12.5000 mg | Freq: Once | INTRAMUSCULAR | Status: AC
  Administered 2017-09-26: 12.5 mg via INTRAVENOUS
  Filled 2017-09-26: qty 1

## 2017-09-26 MED ORDER — PANTOPRAZOLE SODIUM 40 MG PO TBEC
40.0000 mg | DELAYED_RELEASE_TABLET | Freq: Every day | ORAL | Status: DC
  Administered 2017-09-27 – 2017-09-29 (×3): 40 mg via ORAL
  Filled 2017-09-26 (×5): qty 1

## 2017-09-26 MED ORDER — ACETAMINOPHEN 650 MG RE SUPP: 650.0000 mg | Freq: Four times a day (QID) | RECTAL | Status: DC | PRN

## 2017-09-26 MED ORDER — ONDANSETRON HCL 4 MG PO TABS
4.0000 mg | ORAL_TABLET | Freq: Four times a day (QID) | ORAL | Status: DC | PRN
Start: 2017-09-26 — End: 2017-09-30
  Administered 2017-09-27 – 2017-09-30 (×2): 4 mg via ORAL
  Filled 2017-09-26 (×2): qty 1

## 2017-09-26 MED ORDER — LOSARTAN POTASSIUM 50 MG PO TABS
100.0000 mg | ORAL_TABLET | Freq: Every day | ORAL | Status: DC
  Administered 2017-09-26 – 2017-09-30 (×5): 100 mg via ORAL
  Filled 2017-09-26 (×6): qty 2

## 2017-09-26 MED ORDER — MOMETASONE FURO-FORMOTEROL FUM 100-5 MCG/ACT IN AERO
2.0000 | INHALATION_SPRAY | Freq: Two times a day (BID) | RESPIRATORY_TRACT | Status: DC
  Filled 2017-09-26: qty 8.8

## 2017-09-26 MED ORDER — MUPIROCIN 2 % EX OINT
1.0000 | TOPICAL_OINTMENT | Freq: Two times a day (BID) | CUTANEOUS | Status: AC
Start: 2017-09-26 — End: 2017-09-29
  Administered 2017-09-27 – 2017-09-28 (×4): 1 via NASAL
  Filled 2017-09-26: qty 22
  Filled 2017-09-26: qty 44
  Filled 2017-09-26: qty 22

## 2017-09-26 MED ORDER — NITROGLYCERIN 0.4 MG SL SUBL: 0.4000 mg | SUBLINGUAL_TABLET | SUBLINGUAL | Status: DC | PRN

## 2017-09-26 MED ORDER — DICYCLOMINE HCL 10 MG PO CAPS
10.0000 mg | ORAL_CAPSULE | Freq: Three times a day (TID) | ORAL | Status: DC | PRN
  Filled 2017-09-26: qty 1

## 2017-09-26 NOTE — Progress Notes (Signed)
Progress Note    [name] [name]  YNW:[id number] DOB: 1935/10/21  DOA: 09/25/2017 PCP: [name], MD    Brief Narrative:   Chief complaint: Acute on chronic lower back pain  Medical records reviewed and are as summarized below:  [name] [name] is an 82 y.o. female with PMH of CAD status post stent, hypertension, bronchitis, and chronic back pain status post kyphoplasty who was admitted 09/25/2017 for evaluation of acute worsening of her chronic lower back pain associated with increased swelling of her feet.  Initial work-up in the ED included plain films of the lumbar spine which were unremarkable.  Admitted for pain management and further work-up.  Assessment/Plan:   Principal Problem:   Low back pain, acute on chronic/acute lumbar radiculopathy secondary to T12 compression fracture patient with known osteoporosis Plain films personally reviewed, negative for acute findings.  MRI personally reviewed and shows an acute T12 compression fracture.  Has required IV pain management with hydromorphone every 3 hours as needed--has been taking this fairly frequently (parenteral controlled substances).  Continue Neurontin as well.  IR consultation requested for consideration of kyphoplasty.  Active Problems:   Essential hypertension Continue Norvasc, metoprolol and Cozaar.    Coronary artery disease Continue beta-blocker and statin.    GERD Continue Protonix.    Hyperlipidemia Continue Pravachol.  Body mass index is 26.63 kg/m.   Family Communication/Anticipated D/C date and plan/Code Status   DVT prophylaxis: Lovenox given 09/25/2017, will hold in anticipation of kyphoplasty and order SCDs. Code Status: Full Code.  Family Communication: Daughter updated at the bedside, husband updated by telephone. Disposition Plan: Continue pain management pending kyphoplasty.  Discharge date depends on decision regarding kyphoplasty.  Medical Consultants:    Interventional  Radiology   Anti-Infectives:    None  Subjective:   Patient reports significant lower back pain with movement and lower extremity swelling.  No nausea or vomiting.  Chronic constipation noted.  Objective:    Vitals:   09/25/17 1808 09/25/17 2050 09/25/17 2251 09/26/17 0430  BP: (!) 147/87 (!) 152/96 120/78 136/78  Pulse: 80 82 82 82  Resp: (!) 22 18 20 16   Temp: 98.3 F (36.8 C)  98.4 F (36.9 C)   TempSrc:   Oral   SpO2: 98% 98% 96% 98%  Weight: 72.6 kg (160 lb)     Height: 5\' 5"  (1.651 m)      No intake or output data in the 24 hours ending 09/26/17 1046 Filed Weights   09/25/17 1808  Weight: 72.6 kg (160 lb)    Exam: General: No acute distress. Cardiovascular: Heart sounds show a regular rate, and rhythm. No gallops or rubs. No murmurs. No JVD. Lungs: Clear to auscultation bilaterally with good air movement. No rales, rhonchi or wheezes. Abdomen: Soft, nontender, nondistended with normal active bowel sounds. No masses. No hepatosplenomegaly. Neurological: Alert and oriented 3. Moves all extremities 4 with equal strength. Cranial nerves II through XII grossly intact. Skin: Warm and dry. No rashes or lesions. Extremities: No clubbing or cyanosis.  1+ lower extremity edema. Pedal pulses 2+. Psychiatric: Mood and affect are normal. Insight and judgment are normal.   Data Reviewed:   I have personally reviewed following labs and imaging studies:  Labs: Labs show the following:   Basic Metabolic Panel: Recent Labs  Lab 09/25/17 1843 09/26/17 0559  NA 141  --   K 3.8  --   CL 103  --   CO2 24  --  GLUCOSE 92  --   BUN 13  --   CREATININE 0.94 0.86  CALCIUM 9.7  --    GFR Estimated Creatinine Clearance: 52.9 mL/min (by C-G formula based on SCr of 0.86 mg/dL).  CBC: Recent Labs  Lab 09/25/17 1843 09/26/17 0559  WBC 11.8* 7.7  NEUTROABS 8.2*  --   HGB 15.2* 13.5  HCT 46.4* 41.2  MCV 95.9 97.2  PLT 285 251   Microbiology No results found  for this or any previous visit (from the past 240 hour(s)).  Procedures and diagnostic studies:  Dg Chest 2 View  Result Date: 09/26/2017 CLINICAL DATA:  Leukocytosis EXAM: CHEST - 2 VIEW COMPARISON:  08/09/2017 FINDINGS: Cardiac shadow is mildly enlarged but stable. Aortic calcifications are again seen. The lungs are well aerated bilaterally. No focal infiltrate or sizable effusion is seen. No acute bony abnormality is noted. IMPRESSION: No active cardiopulmonary disease. Electronically Signed   By: Inez Catalina M.D.   On: 09/26/2017 08:34   Dg Lumbar Spine Complete  Result Date: 09/25/2017 CLINICAL DATA:  Back pain and leg swelling EXAM: LUMBAR SPINE - COMPLETE 4+ VIEW COMPARISON:  Lumbar spine radiograph 04/27/2017 . FINDINGS: There is posterior spinal fusion at L1-L3. Vertebral augmentation changes at L2-L4. No new compression fracture. Grade 1 L3-4 retrolisthesis and L4-5 anterolisthesis is unchanged IMPRESSION: Changes of prior lumbar fusion and multilevel vertebral augmentation without acute abnormality. Electronically Signed   By: Ulyses Jarred M.D.   On: 09/25/2017 21:19   Mr Lumbar Spine Wo Contrast  Result Date: 09/26/2017 CLINICAL DATA:  Back pain with risk factors for fracture. EXAM: MRI LUMBAR SPINE WITHOUT CONTRAST TECHNIQUE: Multiplanar, multisequence MR imaging of the lumbar spine was performed. No intravenous contrast was administered. COMPARISON:  Radiography from yesterday.  Abdominal CT 04/05/2017 FINDINGS: Segmentation:  5 lumbar vertebrae Alignment: Grade 1 anterolisthesis at L4-5, facet mediated. Compression fractures cause a reversal of lordosis at the upper lumbar spine. Vertebrae: T12 compression fracture with marrow edema about a band of trabecular impaction. Based on prior CT there is a pre-existing superior endplate fracture with mild depression. There is no retropulsion or posterior element involvement. Prior L1, L2, L3, and L4 body fractures with cement augmentation at  L2-L4. Posterior fusion hardware from L1-L3. Conus medullaris and cauda equina: Conus extends to the T12-L1 level. Conus and cauda equina appear normal. Paraspinal and other soft tissues: Dilated common bile duct, chronic and likely from reservoir effect after cholecystectomy. There is atrophy of intrinsic back muscles. Disc levels: T12- L1: Intervertebral ankylosis from bridging osteophyte. No impingement L1-L2: Posterior-lateral fusion. Facet hypertrophy. No visible impingement L2-L3: Advanced disc narrowing. Posterior element hypertrophy. Limited assessment of the foramina due to artifact. Mild spinal stenosis. L3-L4: Degenerative facet arthropathy narrowing the foramina and causing mild to moderate spinal stenosis. Narrow disc without herniation L4-L5: Advanced facet arthropathy with hypertrophy. The disc is bulging. Noncompressive canal and bilateral foraminal stenosis L5-S1:Laminectomy. Advanced disc narrowing with mild facet spurring. No compressive stenosis. IMPRESSION: 1. Acute T12 compression fracture. No depression when accounting for a remote superior endplate fracture. No retropulsion. 2. L1-2 intervertebral ankylosis. 3. Remote L1-L4 vertebral fracture. 4. Diffuse degenerative changes that are described above. No high-grade canal stenosis. Electronically Signed   By: Monte Fantasia M.D.   On: 09/26/2017 08:41    Medications:   . amLODipine  10 mg Oral QHS  . diclofenac sodium  4 g Topical QID  . gabapentin  300 mg Oral TID  . losartan  100 mg Oral  Daily  . metoprolol tartrate  50 mg Oral BID  . mometasone-formoterol  2 puff Inhalation Q12H  . pantoprazole  40 mg Oral Daily  . polyethylene glycol  17 g Oral BID  . pravastatin  40 mg Oral q1800  . senna-docusate  2 tablet Oral BID  . tamsulosin  0.4 mg Oral QPC supper   Continuous Infusions:   LOS: 0 days   Jacquelynn Cree  [facility] Pager [phone]. If unable to reach me by pager, please call my cell phone at [phone]  [phone].  *Please refer to [email or web address], password [email or web address] to get updated schedule on who will round on this patient, as hospitalists switch teams weekly. If 7PM-7AM, please contact night-coverage at [email or web address], password [email or web address] for any overnight needs.  09/26/2017, 10:46 AM

## 2017-09-26 NOTE — H&P (Signed)
History and Physical    [name] [name] MVH:[id number] DOB: 01-02-1936 DOA: 09/25/2017  PCP: [name], MD  Patient coming from: Home.  Chief Complaint: Low back pain.  HPI: [name] [name] is a 82 y.o. female with history of CAD status post stenting, hypertension, bronchitis and chronic back pain has had kyphoplasty previously presents to the ER because of acute worsening of her low back pain since yesterday.  Pain is mid back radiating to the right lower extremity.  Patient over the last 1 week also has noticed increasing swelling in the both feet.  Denies any trauma or fall.  ED Course: In the ER x-ray of the lumbar spine does not show anything acute.  MRI of the L-spine is pending.  Patient is having significant pain on minimal movement and has been admitted for further pain management.  Denies any incontinence of urine or bowel.  Denies any tingling or numbness in the perineal area.  Review of Systems: As per HPI, rest all negative.   Past Medical History:  Diagnosis Date  . Anxiety    takes Xanax daily as needed  . Asthma    Albuterol daily as needed  . Blood transfusion without reported diagnosis   . CAD (coronary artery disease)    LHC 2003 with 40% pLAD, 30% mLAD, 100% D1 (moderate vessel), 100%  D2 (small vessel), 95% mid small OM2.  Pt had PTCA to D1;  D2 and OM2 small vessels and tx medically;  Last Myoview (2012): anterior infarct seen with scar, no ischemia. EF normal. Pt managed medically;  Lexiscan Myoview (12/14):  Low risk; ant scar with very mild peri-infarct ischemia; EF 55% with ant AK   . Cataract   . Chronic back pain    HNP, DDD, spinal stenosis, vertebral compression fractures.   . Chronic nausea    takes Zofran daily as needed.  normal gastric emptying study 03/2013  . Constipation    felt to be functional. takes Senokot and Miralax daily.  treated with Linzess in past.   . DIVERTICULOSIS, COLON 08/28/2008   Qualifier: Diagnosis of  By: [name], [name],  [name]    . Elevated LFTs 2015   probably med induced DILI, from Augmentin vs Pravastatin  . GERD (gastroesophageal reflux disease)    hx of esophageal stricture   . Hiatal hernia    Paraesophageal hernias. s/p fundoplications in 8413 and 04/2013  . History of bronchitis several of yrs ago  . History of colon polyps 03/2009, 03/2011   adenomatous colon polyps, no high grade dysplasia.   [name] History of vertigo    no meds  . Hyperlipidemia    takes Pravastatin daily  . Hypertension    takes Amlodipine,Metoprolol,and Losartan daily  . Nausea 03/2016  . Osteoporosis   . PONV (postoperative nausea and vomiting)    yrs ago  . Slow urinary stream    takes Rapaflo daily  . TIA (transient ischemic attack) 1995   no residual effects noted    Past Surgical History:  Procedure Laterality Date  . APPENDECTOMY     patient unsure of date  . BACK SURGERY    . BLADDER REPAIR    . CHOLECYSTECTOMY     Patient unsure of date.  . CORONARY ANGIOPLASTY  11/16/2001   1 stent  . ESOPHAGEAL MANOMETRY N/A 03/25/2013   Procedure: ESOPHAGEAL MANOMETRY (EM);  Surgeon: Inda Castle, MD;  Location: [facility] ENDOSCOPY;  Service: Endoscopy;  Laterality: N/A;  . ESOPHAGOGASTRODUODENOSCOPY N/A 03/15/2013  Procedure: ESOPHAGOGASTRODUODENOSCOPY (EGD);  Surgeon: Jerene Bears, MD;  Location[facility] ENDOSCOPY;  Service: Endoscopy;  Laterality: N/A;  . ESOPHAGOGASTRODUODENOSCOPY N/A 12/25/2015   Procedure: ESOPHAGOGASTRODUODENOSCOPY (EGD);  Surgeon: Doran Stabler, MD;  Location: [facility] ENDOSCOPY;  Service: Endoscopy;  Laterality: N/A;  . EYE SURGERY  11/2000   bilateral cataracts with lens implant  . FIXATION KYPHOPLASTY LUMBAR SPINE  X 2   "L3-4"  . HEMORRHOID SURGERY    . HIATAL HERNIA REPAIR  06/03/2011   Procedure: LAPAROSCOPIC REPAIR OF HIATAL HERNIA;  Surgeon: Adin Hector, MD;  Location: [facility] ORS;  Service: General;  Laterality: N/A;  . HIATAL HERNIA REPAIR N/A 04/24/2013   Procedure: LAPAROSCOPIC  REPAIR  RECURRENT PARASOPHAGEAL HIATAL HERNIA WITH FUNDOPLICATION;  Surgeon: Adin Hector, MD;  Location: [facility] ORS;  Service: General;  Laterality: N/A;  . INSERTION OF MESH N/A 04/24/2013   Procedure: INSERTION OF MESH;  Surgeon: Adin Hector, MD;  Location: [facility] ORS;  Service: General;  Laterality: N/A;  . LAPAROSCOPIC LYSIS OF ADHESIONS N/A 04/24/2013   Procedure: LAPAROSCOPIC LYSIS OF ADHESIONS;  Surgeon: Adin Hector, MD;  Location: [facility] OR[facility];  Service: General;  Laterality: N/A;  . LAPAROSCOPIC NISSEN FUNDOPLICATION  2/83/1517   Procedure: LAPAROSCOPIC NISSEN FUNDOPLICATION;  Surgeon: Adin Hector, MD;  Location: [facility] OR[facility];  Service: General;  Laterality: N/A;  . LUMBAR FUSION  12/07/2015   Right L2-3 facetectomy, posterolateral fusion L2-3 with pedicle screws, rods, local bone graft, Vivigen cancellous chips. Fusion extended to the L1 level with pedicle screws and rods  . LUMBAR LAMINECTOMY/DECOMPRESSION MICRODISCECTOMY Right 06/26/2012   Procedure: LUMBAR LAMINECTOMY/DECOMPRESSION MICRODISCECTOMY;  Surgeon: Jessy Oto, MD;  Location: [facility];  Service: Orthopedics;  Laterality: Right;  RIGHT L4-5 MICRODISCECTOMY  . LUMBAR LAMINECTOMY/DECOMPRESSION MICRODISCECTOMY N/A 05/29/2015   Procedure: RIGHT L2-3 MICRODISCECTOMY;  Surgeon: Jessy Oto, MD;  Location: [facility];  Service: Orthopedics;  Laterality: N/A;  . TOTAL ABDOMINAL HYSTERECTOMY  1975   partial     reports that she has never smoked. She has never used smokeless tobacco. She reports that she does not drink alcohol or use drugs.  Allergies  Allergen Reactions  . Ace Inhibitors Cough    Per Dr Melvyn Novas pulmonology 2013  . Aspartame Diarrhea and Nausea And Vomiting  . Atorvastatin Other (See Comments)    Muscle aches  . Biaxin [Clarithromycin] Other (See Comments)    PATIENT CAN TOLERATE Z-PAK.  Is written on patient's paper chart.  [name] [Roflumilast] Nausea And Vomiting  . Erythromycin Other (See Comments)    PATIENT CAN  TOLERATE Z-PAK.  It is written on patient's paper chart.  . Levofloxacin Nausea And Vomiting    Family History  Problem Relation Age of Onset  . Colon cancer Mother 14  . Heart disease Father 43       heart attack  . Brain cancer Brother        tumor   . Heart disease Brother   . Heart disease Brother   . Kidney disease Daughter     Prior to Admission medications   Medication Sig Start Date End Date Taking? Authorizing Provider  acetaminophen (TYLENOL) 325 MG tablet Take 2 tablets (650 mg total) by mouth every 6 (six) hours as needed for mild pain (or Fever >/= 101). 12/26/15  Yes Emokpae, Courage, MD  albuterol (PROAIR HFA) 108 (90 Base) MCG/ACT inhaler Inhale 2 puffs into the lungs every 6 (six) hours as needed for wheezing or shortness of  breath. 09/22/16  Yes Vicie Mutters, PA-C  ALPRAZolam Duanne Moron) 0.5 MG tablet Take 1/2 to 1 tablet 2 to 3 x / day ONLY if Anxiety Attack and please try to limit to 5 days /week to avoid addiction 08/14/17  Yes [name], MD  amLODipine (NORVASC) 10 MG tablet Take 1 tablet daily for BP Patient taking differently: Take 10 mg by mouth at bedtime.  09/08/17  Yes [name], MD  Cholecalciferol (VITAMIN D3) 2000 UNITS TABS Take 10,000 Units by mouth daily.    Yes [provider]  diclofenac sodium (VOLTAREN) 1 % GEL Apply 4 g topically 4 (four) times daily. 12/06/16  Yes Jessy Oto, MD  dicyclomine (BENTYL) 10 MG capsule TAKE 1 CAPSULE BY MOUTH EVERY 8 HOURS AS NEEDED FOR SPASM 08/04/17  Yes Armbruster, Carlota Raspberry, MD  gabapentin (NEURONTIN) 300 MG capsule Take 300 mg by mouth 3 (three) times daily.    Yes [provider]  ipratropium-albuterol (DUONEB) 0.5-2.5 (3) MG/3ML SOLN USE ONE VIAL IN NEBULIZER 4 TIMES DAILY Patient taking differently: Take 3 mLs by nebulization every 6 (six) hours as needed (wheezing). USE ONE VIAL IN NEBULIZER 4 TIMES DAILY as needed 08/09/17  Yes Corbett, Caryl Pina, NP  ketoconazole (NIZORAL) 2 % cream APPLY  1 APPLICATION TOPICALLY TWO TIMES DAILY 02/10/17  Yes Vicie Mutters, PA-C  losartan (COZAAR) 100 MG tablet TAKE ONE TABLET BY MOUTH ONCE DAILY (DISCONTINUE  BENICAR) 06/22/17  Yes [name], MD  meclizine (ANTIVERT) 25 MG tablet 1/2-1 pill up to 3 times daily for motion sickness/dizziness 06/08/16  Yes Vicie Mutters, PA-C  metoprolol tartrate (LOPRESSOR) 50 MG tablet TAKE ONE TABLET BY MOUTH TWICE DAILY FOR  BLOOD  PRESSURE Patient taking differently: TAKE ONE TABLET BY MOUTH IN THE EVENING FOR BLOOD  PRESSURE 04/02/17  Yes Vicie Mutters, PA-C  mometasone-formoterol (DULERA) 100-5 MCG/ACT AERO Inhale 2 puffs into the lungs every 12 (twelve) hours. 09/08/17  Yes [name], MD  neomycin-polymyxin b-dexamethasone (MAXITROL) 3.5-10000-0.1 OINT APPLY TO STYE FOUR TIMES DAILY. 07/07/17  Yes Vicie Mutters, PA-C  nitroGLYCERIN (NITROSTAT) 0.4 MG SL tablet Place 1 tablet (0.4 mg total) under the tongue every 5 (five) minutes as needed for chest pain. 09/04/14  Yes Larey Dresser, MD  OVER THE COUNTER MEDICATION Take 1,000 mg by mouth 2 (two) times daily. [place] Calcium 1000mg    Yes [provider]  oxyCODONE-acetaminophen (PERCOCET/ROXICET) 5-325 MG tablet Take 1 tablet by mouth every 4 (four) hours as needed for severe pain. 03/10/17  Yes Tegeler, Gwenyth Allegra, MD  pantoprazole (PROTONIX) 40 MG tablet Take 1 tablet (40 mg total) daily by mouth. 03/23/17  Yes Vicie Mutters, PA-C  polyethylene glycol powder (GLYCOLAX/MIRALAX) powder Take 17 g by mouth 2 (two) times daily. 05/16/17  Yes Armbruster, Carlota Raspberry, MD  pravastatin (PRAVACHOL) 40 MG tablet Take 1 tablet (40 mg total) by mouth daily. 07/31/17  Yes [name], MD  promethazine (PHENERGAN) 25 MG tablet Take 1 tablet (25 mg total) by mouth every 6 (six) hours as needed for nausea or vomiting. 09/04/17  Yes [name], MD  senna-docusate (SENOKOT-S) 8.6-50 MG tablet Take 2 tablets by mouth 2 (two) times daily. 12/26/15  Yes  Emokpae, Courage, MD  silodosin (RAPAFLO) 8 MG CAPS capsule Take 8 mg by mouth daily with breakfast.    Yes [provider]  amLODipine (NORVASC) 10 MG tablet TAKE 1 TABLET BY MOUTH AT BEDTIME Patient not taking: Reported on 09/25/2017 09/08/17   [name], MD  potassium chloride SA (K-DUR,KLOR-CON) 20 MEQ tablet Take 1 tablet (20 mEq total) by mouth 2 (two) times daily. 11/25/14   [name], MD  risedronate (ACTONEL) 150 MG tablet Take 1 tablet (150 mg total) by mouth every 30 (thirty) days. with water on empty stomach, nothing by mouth or lie down for next 30 minutes. Patient not taking: Reported on 09/25/2017 07/25/17   [name], MD  tamsulosin (FLOMAX) 0.4 MG CAPS capsule Take 0.4 mg by mouth daily. 09/12/17   [provider]    Physical Exam: Vitals:   09/25/17 1808 09/25/17 2050 09/25/17 2251 09/26/17 0430  BP: (!) 147/87 (!) 152/96 120/78 136/78  Pulse: 80 82 82 82  Resp: (!) 22 18 20 16   Temp: 98.3 F (36.8 C)  98.4 F (36.9 C)   TempSrc:   Oral   SpO2: 98% 98% 96% 98%  Weight: 72.6 kg (160 lb)     Height: 5\' 5"  (1.651 m)         Constitutional: Moderately built and nourished. Vitals:   09/25/17 1808 09/25/17 2050 09/25/17 2251 09/26/17 0430  BP: (!) 147/87 (!) 152/96 120/78 136/78  Pulse: 80 82 82 82  Resp: (!) 22 18 20 16   Temp: 98.3 F (36.8 C)  98.4 F (36.9 C)   TempSrc:   Oral   SpO2: 98% 98% 96% 98%  Weight: 72.6 kg (160 lb)     Height: 5\' 5"  (1.651 m)      Eyes: Anicteric no pallor. ENMT: No discharge from the ears eyes nose or mouth. Neck: No mass felt.  No neck rigidity. Respiratory: No rhonchi or crepitations. Cardiovascular: S1-S2 heard no murmurs appreciated. Abdomen: Soft nontender bowel sounds present. Musculoskeletal: Mild edema both feet. Skin: No rash. Neurologic: Alert awake oriented to time place and person.  Moves all extremities. Psychiatric: Appears normal with a normal affect.   Labs on Admission: I  have personally reviewed following labs and imaging studies  CBC: Recent Labs  Lab 09/25/17 1843  WBC 11.8*  NEUTROABS 8.2*  HGB 15.2*  HCT 46.4*  MCV 95.9  PLT 254   Basic Metabolic Panel: Recent Labs  Lab 09/25/17 1843  NA 141  K 3.8  CL 103  CO2 24  GLUCOSE 92  BUN 13  CREATININE 0.94  CALCIUM 9.7   GFR: Estimated Creatinine Clearance: 48.4 mL/min (by C-G formula based on SCr of 0.94 mg/dL). Liver Function Tests: No results for input(s): AST, ALT, ALKPHOS, BILITOT, PROT, ALBUMIN in the last 168 hours. No results for input(s): LIPASE, AMYLASE in the last 168 hours. No results for input(s): AMMONIA in the last 168 hours. Coagulation Profile: No results for input(s): INR, PROTIME in the last 168 hours. Cardiac Enzymes: No results for input(s): CKTOTAL, CKMB, CKMBINDEX, TROPONINI in the last 168 hours. BNP (last 3 results) No results for input(s): PROBNP in the last 8760 hours. HbA1C: No results for input(s): HGBA1C in the last 72 hours. CBG: No results for input(s): GLUCAP in the last 168 hours. Lipid Profile: No results for input(s): CHOL, HDL, LDLCALC, TRIG, CHOLHDL, LDLDIRECT in the last 72 hours. Thyroid Function Tests: No results for input(s): TSH, T4TOTAL, FREET4, T3FREE, THYROIDAB in the last 72 hours. Anemia Panel: No results for input(s): VITAMINB12, FOLATE, FERRITIN, TIBC, IRON, RETICCTPCT in the last 72 hours. Urine analysis:    Component Value Date/Time   COLORURINE YELLOW 09/26/2017 0417   APPEARANCEUR CLOUDY (A) 09/26/2017 0417   LABSPEC 1.018 09/26/2017 0417   PHURINE 8.0 09/26/2017  [place] 09/26/2017 0417   HGBUR NEGATIVE 09/26/2017 0417   BILIRUBINUR NEGATIVE 09/26/2017 0417   KETONESUR 5 (A) 09/26/2017 0417   PROTEINUR NEGATIVE 09/26/2017 0417   UROBILINOGEN 0.2 09/19/2014 2235   NITRITE NEGATIVE 09/26/2017 0417   LEUKOCYTESUR NEGATIVE 09/26/2017 0417   Sepsis Labs: @LABRCNTIP (procalcitonin:4,lacticidven:4) )No  results found for this or any previous visit (from the past 240 hour(s)).   Radiological Exams on Admission: Dg Lumbar Spine Complete  Result Date: 09/25/2017 CLINICAL DATA:  Back pain and leg swelling EXAM: LUMBAR SPINE - COMPLETE 4+ VIEW COMPARISON:  Lumbar spine radiograph 04/27/2017 . FINDINGS: There is posterior spinal fusion at L1-L3. Vertebral augmentation changes at L2-L4. No new compression fracture. Grade 1 L3-4 retrolisthesis and L4-5 anterolisthesis is unchanged IMPRESSION: Changes of prior lumbar fusion and multilevel vertebral augmentation without acute abnormality. Electronically Signed   By: Ulyses Jarred M.D.   On: 09/25/2017 21:19     Assessment/Plan Principal Problem:   Low back pain Active Problems:   Essential hypertension   Lumbar radiculopathy, acute    1. Lumbar radiculopathy -patient placed on pain relief medications.  MRI L-spine is pending.  Further recommendation based on MRI findings. 2. Bilateral lower extremity edema.  Will check Dopplers.  Patient is not short of breath.  BNP was 100.  Will check 2D echo.  If work-up negative lower extremity edema could be from amlodipine. 3. Hypertension on amlodipine ARB and metoprolol. 4. History of CAD status post stenting denies any chest pain. 5. History of bronchitis presently not wheezing. 6. Mild leukocytosis.  Will check UA and chest x-ray.   DVT prophylaxis: Lovenox.   Code Status: Full code. Family Communication: Patient's daughter. Disposition Plan: Home. Consults called: Physical therapy. Admission status: Observation.   Rise Patience MD [facility] Pager [phone].  If 7PM-7AM, please contact night-coverage [email or web address] Password [facility]  09/26/2017, 5:41 AM

## 2017-09-26 NOTE — Progress Notes (Deleted)
Office Visit Note  Patient: [name] [name]             Date of Birth: [birth year]           MRN: [id number]             PCP: [name], MD Referring: [name], MD Visit Date: 10/10/2017 Occupation: @GUAROCC @    Subjective:  No chief complaint on file.   History of Present Illness: [name] [name] is a 82 y.o. female ***   Activities of Daily Living:  Patient reports morning stiffness for *** {minute/hour:19697}.   Patient {ACTIONS;DENIES/REPORTS:21021675::"Denies"} nocturnal pain.  Difficulty dressing/grooming: {ACTIONS;DENIES/REPORTS:21021675::"Denies"} Difficulty climbing stairs: {ACTIONS;DENIES/REPORTS:21021675::"Denies"} Difficulty getting out of chair: {ACTIONS;DENIES/REPORTS:21021675::"Denies"} Difficulty using hands for taps, buttons, cutlery, and/or writing: {ACTIONS;DENIES/REPORTS:21021675::"Denies"}   No Rheumatology ROS completed.   PMFS History:  Patient Active Problem List   Diagnosis Date Noted  . Low back pain 09/26/2017  . T12 compression fracture ([place]) 09/26/2017  . Chronic bronchitis ([place]) 08/09/2017  . Atherosclerosis of aorta ([place]) 04/05/2017  . Costochondritis 11/14/2016  . Anxiety state 06/08/2016  . History of lumbar spinal fusion 04/13/2016  . Spondylolisthesis, lumbar region 04/13/2016  . Leukocytosis 03/28/2016  . Lumbar radiculopathy, acute 12/07/2015    Class: Chronic  . Spinal stenosis, lumbar region, with neurogenic claudication 05/29/2015  . DDD (degenerative disc disease), lumbar 04/21/2015  . Mixed hyperlipidemia 11/24/2014  . GERD  11/24/2014  . Depression, major, in partial remission ([place]) 09/26/2014  . Chronic pain syndrome 09/26/2014  . Coronary artery disease due to lipid rich plaque   . Osteoporosis 02/10/2014  . Prediabetes 08/14/2013  . Vitamin D deficiency 08/14/2013  . Medication management 08/14/2013  . Constipation, chronic 04/03/2013  . MGUS (monoclonal gammopathy of unknown significance) 03/05/2013  .  Herniated lumbar intervertebral disc 06/25/2012    Class: Acute  . Vertebral compression fracture ([place]) 06/23/2012  . ESOPHAGEAL STRICTURE 08/28/2008  . Mild intermittent asthma without complication 40/98/1191  . Incarcerated recurrent paraesophageal hiatal hernia s/p lap redo repair [id number] 01/31/2008  . Essential hypertension 07/25/2007    Past Medical History:  Diagnosis Date  . Anxiety    takes Xanax daily as needed  . Asthma    Albuterol daily as needed  . Blood transfusion without reported diagnosis   . CAD (coronary artery disease)    LHC 2003 with 40% pLAD, 30% mLAD, 100% D1 (moderate vessel), 100%  D2 (small vessel), 95% mid small OM2.  Pt had PTCA to D1;  D2 and OM2 small vessels and tx medically;  Last Myoview (2012): anterior infarct seen with scar, no ischemia. EF normal. Pt managed medically;  Lexiscan Myoview (12/14):  Low risk; ant scar with very mild peri-infarct ischemia; EF 55% with ant AK   . Cataract   . Chronic back pain    HNP, DDD, spinal stenosis, vertebral compression fractures.   . Chronic nausea    takes Zofran daily as needed.  normal gastric emptying study 03/2013  . Constipation    felt to be functional. takes Senokot and Miralax daily.  treated with Linzess in past.   . DIVERTICULOSIS, COLON 08/28/2008   Qualifier: Diagnosis of  By: [name], [name], [name]    . Elevated LFTs 2015   probably med induced DILI, from Augmentin vs Pravastatin  . GERD (gastroesophageal reflux disease)    hx of esophageal stricture   . Hiatal hernia    Paraesophageal hernias. s/p fundoplications in 2130 and 04/2013  . History of bronchitis several of yrs  ago  . History of colon polyps 03/2009, 03/2011   adenomatous colon polyps, no high grade dysplasia.   [name] History of vertigo    no meds  . Hyperlipidemia    takes Pravastatin daily  . Hypertension    takes Amlodipine,Metoprolol,and Losartan daily  . Nausea 03/2016  . Osteoporosis   . PONV (postoperative nausea and  vomiting)    yrs ago  . Slow urinary stream    takes Rapaflo daily  . TIA (transient ischemic attack) 1995   no residual effects noted    Family History  Problem Relation Age of Onset  . Colon cancer Mother 84  . Heart disease Father 72       heart attack  . Brain cancer Brother        tumor   . Heart disease Brother   . Heart disease Brother   . Kidney disease Daughter    Past Surgical History:  Procedure Laterality Date  . APPENDECTOMY     patient unsure of date  . BACK SURGERY    . BLADDER REPAIR    . CHOLECYSTECTOMY     Patient unsure of date.  . CORONARY ANGIOPLASTY  11/16/2001   1 stent  . ESOPHAGEAL MANOMETRY N/A 03/25/2013   Procedure: ESOPHAGEAL MANOMETRY (EM);  Surgeon: Inda Castle, MD;  Location: [facility] END[facility];  Service: Endoscopy;  Laterality: N/A;  . ESOPHAGOGASTRODUODENOSCOPY N/A 03/15/2013   Procedure: ESOPHAGOGASTRODUODENOSCOPY (EGD);  Surgeon: Jerene Bears, MD;  Location: [facility] ENDOSCOPY;  Service: Endoscopy;  Laterality: N/A;  . ESOPHAGOGASTRODUODENOSCOPY N/A 12/25/2015   Procedure: ESOPHAGOGASTRODUODENOSCOPY (EGD);  Surgeon: Doran Stabler, MD;  Location: [facility] ENDOSCOPY;  Service: Endoscopy;  Laterality: N/A;  . EYE SURGERY  11/2000   bilateral cataracts with lens implant  . FIXATION KYPHOPLASTY LUMBAR SPINE  X 2   "L3-4"  . HEMORRHOID SURGERY    . HIATAL HERNIA REPAIR  06/03/2011   Procedure: LAPAROSCOPIC REPAIR OF HIATAL HERNIA;  Surgeon: Adin Hector, MD;  Location: [facility] ORS;  Service: General;  Laterality: N/A;  . HIATAL HERNIA REPAIR N/A 04/24/2013   Procedure: LAPAROSCOPIC  REPAIR RECURRENT PARASOPHAGEAL HIATAL HERNIA WITH FUNDOPLICATION;  Surgeon: Adin Hector, MD;  Location: [facility] OR[facility];  Service: General;  Laterality: N/A;  . INSERTION OF MESH N/A 04/24/2013   Procedure: INSERTION OF MESH;  Surgeon: Adin Hector, MD;  Location: [facility] OR[facility];  Service: General;  Laterality: N/A;  . LAPAROSCOPIC LYSIS OF ADHESIONS N/A 04/24/2013   Procedure:  LAPAROSCOPIC LYSIS OF ADHESIONS;  Surgeon: Adin Hector, MD;  Location: [facility];  Service: General;  Laterality: N/A;  . LAPAROSCOPIC NISSEN FUNDOPLICATION  1/32/4401   Procedure: LAPAROSCOPIC NISSEN FUNDOPLICATION;  Surgeon: Adin Hector, MD;  Location: [facility];  Service: General;  Laterality: N/A;  . LUMBAR FUSION  12/07/2015   Right L2-3 facetectomy, posterolateral fusion L2-3 with pedicle screws, rods, local bone graft, Vivigen cancellous chips. Fusion extended to the L1 level with pedicle screws and rods  . LUMBAR LAMINECTOMY/DECOMPRESSION MICRODISCECTOMY Right 06/26/2012   Procedure: LUMBAR LAMINECTOMY/DECOMPRESSION MICRODISCECTOMY;  Surgeon: Jessy Oto, MD;  Location: [place] [facility];  Service: Orthopedics;  Laterality: Right;  RIGHT L4-5 MICRODISCECTOMY  . LUMBAR LAMINECTOMY/DECOMPRESSION MICRODISCECTOMY N/A 05/29/2015   Procedure: RIGHT L2-3 MICRODISCECTOMY;  Surgeon: Jessy Oto, MD;  Location: [facility];  Service: Orthopedics;  Laterality: N/A;  . TOTAL ABDOMINAL HYSTERECTOMY  1975   partial   Social History   Social History Narrative   Lives with husband.  Right-handed.   3 cups caffeine per day.     Objective: Vital Signs: There were no vitals taken for this visit.   Physical Exam   Musculoskeletal Exam: ***  CDAI Exam: No CDAI exam completed.    Investigation: No additional findings. CBC Latest Ref Rng & Units 09/26/2017 09/25/2017 09/04/2017  WBC 4.0 - 10.5 K/uL 7.7 11.8(H) 6.7  Hemoglobin 12.0 - 15.0 g/dL 13.5 15.2(H) 15.2  Hematocrit 36.0 - 46.0 % 41.2 46.4(H) 44.2  Platelets 150 - 400 K/uL 251 285 272   CMP Latest Ref Rng & Units 09/26/2017 09/25/2017 07/25/2017  Glucose 65 - 99 mg/dL - 92 112(H)  BUN 6 - 20 mg/dL - 13 17  Creatinine 0.44 - 1.00 mg/dL 0.86 0.94 0.98(H)  Sodium 135 - 145 mmol/L - 141 141  Potassium 3.5 - 5.1 mmol/L - 3.8 4.4  Chloride 101 - 111 mmol/L - 103 106  CO2 22 - 32 mmol/L - 24 27  Calcium 8.9 - 10.3 mg/dL - 9.7 10.2  Total Protein 6.1 -  8.1 g/dL - - 6.9  Total Bilirubin 0.2 - 1.2 mg/dL - - 0.9  Alkaline Phos 38 - 126 U/L - - -  AST 10 - 35 U/L - - 23  ALT 6 - 29 U/L - - 30(H)    Imaging: Dg Chest 2 View  Result Date: 09/26/2017 CLINICAL DATA:  Leukocytosis EXAM: CHEST - 2 VIEW COMPARISON:  08/09/2017 FINDINGS: Cardiac shadow is mildly enlarged but stable. Aortic calcifications are again seen. The lungs are well aerated bilaterally. No focal infiltrate or sizable effusion is seen. No acute bony abnormality is noted. IMPRESSION: No active cardiopulmonary disease. Electronically Signed   By: Inez Catalina M.D.   On: 09/26/2017 08:34   Dg Lumbar Spine Complete  Result Date: 09/25/2017 CLINICAL DATA:  Back pain and leg swelling EXAM: LUMBAR SPINE - COMPLETE 4+ VIEW COMPARISON:  Lumbar spine radiograph 04/27/2017 . FINDINGS: There is posterior spinal fusion at L1-L3. Vertebral augmentation changes at L2-L4. No new compression fracture. Grade 1 L3-4 retrolisthesis and L4-5 anterolisthesis is unchanged IMPRESSION: Changes of prior lumbar fusion and multilevel vertebral augmentation without acute abnormality. Electronically Signed   By: Ulyses Jarred M.D.   On: 09/25/2017 21:19   Mr Lumbar Spine Wo Contrast  Result Date: 09/26/2017 CLINICAL DATA:  Back pain with risk factors for fracture. EXAM: MRI LUMBAR SPINE WITHOUT CONTRAST TECHNIQUE: Multiplanar, multisequence MR imaging of the lumbar spine was performed. No intravenous contrast was administered. COMPARISON:  Radiography from yesterday.  Abdominal CT 04/05/2017 FINDINGS: Segmentation:  5 lumbar vertebrae Alignment: Grade 1 anterolisthesis at L4-5, facet mediated. Compression fractures cause a reversal of lordosis at the upper lumbar spine. Vertebrae: T12 compression fracture with marrow edema about a band of trabecular impaction. Based on prior CT there is a pre-existing superior endplate fracture with mild depression. There is no retropulsion or posterior element involvement. Prior  L1, L2, L3, and L4 body fractures with cement augmentation at L2-L4. Posterior fusion hardware from L1-L3. Conus medullaris and cauda equina: Conus extends to the T12-L1 level. Conus and cauda equina appear normal. Paraspinal and other soft tissues: Dilated common bile duct, chronic and likely from reservoir effect after cholecystectomy. There is atrophy of intrinsic back muscles. Disc levels: T12- L1: Intervertebral ankylosis from bridging osteophyte. No impingement L1-L2: Posterior-lateral fusion. Facet hypertrophy. No visible impingement L2-L3: Advanced disc narrowing. Posterior element hypertrophy. Limited assessment of the foramina due to artifact. Mild spinal stenosis. L3-L4: Degenerative facet arthropathy narrowing the  foramina and causing mild to moderate spinal stenosis. Narrow disc without herniation L4-L5: Advanced facet arthropathy with hypertrophy. The disc is bulging. Noncompressive canal and bilateral foraminal stenosis L5-S1:Laminectomy. Advanced disc narrowing with mild facet spurring. No compressive stenosis. IMPRESSION: 1. Acute T12 compression fracture. No depression when accounting for a remote superior endplate fracture. No retropulsion. 2. L1-2 intervertebral ankylosis. 3. Remote L1-L4 vertebral fracture. 4. Diffuse degenerative changes that are described above. No high-grade canal stenosis. Electronically Signed   By: Monte Fantasia M.D.   On: 09/26/2017 08:41    Speciality Comments: No specialty comments available.    Procedures:  No procedures performed Allergies: Ace inhibitors; Aspartame; Atorvastatin; Biaxin [clarithromycin]; Daliresp [roflumilast]; Erythromycin; and Levofloxacin   Assessment / Plan:     Visit Diagnoses: No diagnosis found.    Orders: No orders of the defined types were placed in this encounter.  No orders of the defined types were placed in this encounter.   Face-to-face time spent with patient was *** minutes. 50% of time was spent in counseling  and coordination of care.  Follow-Up Instructions: No follow-ups on file.   Earnestine Mealing, CMA  Note - This record has been created using Editor, commissioning.  Chart creation errors have been sought, but may not always  have been located. Such creation errors do not reflect on  the standard of medical care.

## 2017-09-26 NOTE — ED Notes (Signed)
Pt taken to mri

## 2017-09-26 NOTE — Progress Notes (Signed)
Consult received for KP due to T12 compression fracture.  Imaging reviewed by Dr. Estanislado Pandy who approves patient for procedure.  Have sent request for insurance approval.   Brynda Greathouse, MS RD PA-C 12:57 PM

## 2017-09-26 NOTE — ED Notes (Signed)
Walked patient to the bathroom patient is now back in bed on the monitor call bell in reach 

## 2017-09-26 NOTE — Evaluation (Signed)
Physical Therapy Evaluation Patient Details Name: [name] [name] MRN: [id number] DOB: 07/18/1935 Today's Date: 09/26/2017   History of Present Illness  [name] [name] is an 82 y.o. female with PMH of CAD status post stent, hypertension, bronchitis, and chronic back pain status post kyphoplasty who was admitted 09/25/2017 for evaluation of acute worsening of her chronic lower back pain associated with increased swelling of her feet.  Found to have acute T12 compression fracture.   Clinical Impression  Patient presents with decreased mobility due to pain and limited activity tolerance.  She will benefit from skilled PT in the acute setting to allow return home with family support and follow up HHPT.    Follow Up Recommendations Home health PT    Equipment Recommendations  None recommended by PT    Recommendations for Other Services       Precautions / Restrictions Precautions Precautions: Fall      Mobility  Bed Mobility Overal bed mobility: Needs Assistance Bed Mobility: Supine to Sit;Sit to Supine     Supine to sit: Supervision;HOB elevated Sit to supine: Min guard;Min assist   General bed mobility comments: assist for positioning in bed to scoot up to [facility] w/ HOB flat  Transfers Overall transfer level: Needs assistance Equipment used: Rolling walker (2 wheeled) Transfers: Sit to/from Stand Sit to Stand: Supervision;Min guard         General transfer comment: assist for safety and with cues due to rushing to bathroom, stood in bathroom from low toilet unaided  Ambulation/Gait Ambulation/Gait assistance: Min guard Ambulation Distance (Feet): 60 Feet(& 12') Assistive device: Rolling walker (2 wheeled) Gait Pattern/deviations: Step-through pattern;Decreased stride length;Trunk flexed;Shuffle     General Gait Details: limited by pain and with shuffling gait increased fall risk, though reports no falls  Stairs            Wheelchair Mobility    Modified Rankin  (Stroke Patients Only)       Balance Overall balance assessment: Needs assistance   Sitting balance-Leahy Scale: Good       Standing balance-Leahy Scale: Fair Standing balance comment: can stand to perform perineal hygiene after toileting without UE support                             Pertinent Vitals/Pain Pain Assessment: Faces Faces Pain Scale: Hurts even more Pain Location: abdominal cramping; back pain with ambulation Pain Descriptors / Indicators: Constant;Grimacing;Guarding;Discomfort Pain Intervention(s): Monitored during session;Repositioned;Limited activity within patient's tolerance    Home Living Family/patient expects to be discharged to:: Private residence Living Arrangements: Spouse/significant other Available Help at Discharge: Family;Available 24 hours/day Type of Home: House Home Access: Stairs to enter Entrance Stairs-Rails: None Entrance Stairs-Number of Steps: 3 in back Home Layout: One level Home Equipment: Walker - 2 wheels;Walker - 4 wheels;Shower seat;Grab bars - tub/shower;Grab bars - toilet      Prior Function Level of Independence: Independent with assistive device(s)               Hand Dominance   Dominant Hand: Right    Extremity/Trunk Assessment        Lower Extremity Assessment Lower Extremity Assessment: Overall WFL for tasks assessed    Cervical / Trunk Assessment Cervical / Trunk Assessment: Kyphotic;Other exceptions Cervical / Trunk Exceptions: kyphoscoliotic with h/o multiple compression fractures  Communication   Communication: 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  Cognition Arousal/Alertness: Awake/alert Behavior During Therapy: WFL for tasks assessed/performed Overall Cognitive Status: Within Functional  Limits for tasks assessed                                        General Comments      Exercises     Assessment/Plan    PT Assessment Patient needs continued PT services  PT Problem List Decreased  mobility;Decreased activity tolerance;Decreased balance;Decreased knowledge of use of DME;Pain;Decreased safety awareness;Decreased knowledge of precautions       PT Treatment Interventions DME instruction;Functional mobility training;Balance training;Patient/family education;Gait training;Therapeutic exercise    PT Goals (Current goals can be found in the Care Plan section)  Acute Rehab PT Goals Patient Stated Goal: to have procedure to help pain PT Goal Formulation: With patient Time For Goal Achievement: 10/10/17 Potential to Achieve Goals: Good    Frequency Min 3X/week   Barriers to discharge        Co-evaluation               AM-PAC PT "6 Clicks" Daily Activity  Outcome Measure Difficulty turning over in bed (including adjusting bedclothes, sheets and blankets)?: A Little Difficulty moving from lying on back to sitting on the side of the bed? : A Lot Difficulty sitting down on and standing up from a chair with arms (e.g., wheelchair, bedside commode, etc,.)?: A Lot Help needed moving to and from a bed to chair (including a wheelchair)?: A Little Help needed walking in hospital room?: A Little Help needed climbing 3-5 steps with a railing? : A Lot 6 Click Score: 15    End of Session Equipment Utilized During Treatment: Gait belt Activity Tolerance: Patient limited by pain Patient left: in bed;with call bell/phone within reach Nurse Communication: Other (comment)(check IV) PT Visit Diagnosis: Other abnormalities of gait and mobility (R26.89);Difficulty in walking, not elsewhere classified (R26.2)    Time: 1601-0932 PT Time Calculation (min) (ACUTE ONLY): 20 min   Charges:   PT Evaluation $PT Eval Moderate Complexity: 1 Mod     PT G CodesMagda Kiel, [place] [phone] 09/26/2017   Reginia Naas 09/26/2017, 4:36 PM

## 2017-09-26 NOTE — ED Notes (Signed)
Pt is still in MRI.

## 2017-09-26 NOTE — ED Notes (Signed)
Pt ambulatory to restroom with assistance, c/o bilateral upper leg pain and back pain

## 2017-09-27 ENCOUNTER — Encounter (HOSPITAL_COMMUNITY): Payer: Self-pay | Admitting: Internal Medicine

## 2017-09-27 ENCOUNTER — Ambulatory Visit (INDEPENDENT_AMBULATORY_CARE_PROVIDER_SITE_OTHER): Payer: PPO | Admitting: Surgery

## 2017-09-27 DIAGNOSIS — M5416 Radiculopathy, lumbar region: Secondary | ICD-10-CM

## 2017-09-27 DIAGNOSIS — R600 Localized edema: Secondary | ICD-10-CM | POA: Diagnosis present

## 2017-09-27 DIAGNOSIS — F419 Anxiety disorder, unspecified: Secondary | ICD-10-CM | POA: Diagnosis present

## 2017-09-27 DIAGNOSIS — Z8249 Family history of ischemic heart disease and other diseases of the circulatory system: Secondary | ICD-10-CM | POA: Diagnosis not present

## 2017-09-27 DIAGNOSIS — M8000XD Age-related osteoporosis with current pathological fracture, unspecified site, subsequent encounter for fracture with routine healing: Secondary | ICD-10-CM | POA: Diagnosis not present

## 2017-09-27 DIAGNOSIS — Z8673 Personal history of transient ischemic attack (TIA), and cerebral infarction without residual deficits: Secondary | ICD-10-CM | POA: Diagnosis not present

## 2017-09-27 DIAGNOSIS — Z955 Presence of coronary angioplasty implant and graft: Secondary | ICD-10-CM | POA: Diagnosis not present

## 2017-09-27 DIAGNOSIS — M545 Low back pain: Secondary | ICD-10-CM

## 2017-09-27 DIAGNOSIS — I129 Hypertensive chronic kidney disease with stage 1 through stage 4 chronic kidney disease, or unspecified chronic kidney disease: Secondary | ICD-10-CM | POA: Diagnosis present

## 2017-09-27 DIAGNOSIS — M8008XA Age-related osteoporosis with current pathological fracture, vertebra(e), initial encounter for fracture: Secondary | ICD-10-CM | POA: Diagnosis present

## 2017-09-27 DIAGNOSIS — I2510 Atherosclerotic heart disease of native coronary artery without angina pectoris: Secondary | ICD-10-CM

## 2017-09-27 DIAGNOSIS — S22080A Wedge compression fracture of T11-T12 vertebra, initial encounter for closed fracture: Secondary | ICD-10-CM | POA: Diagnosis not present

## 2017-09-27 DIAGNOSIS — Z800 Family history of malignant neoplasm of digestive organs: Secondary | ICD-10-CM | POA: Diagnosis not present

## 2017-09-27 DIAGNOSIS — N183 Chronic kidney disease, stage 3 unspecified: Secondary | ICD-10-CM

## 2017-09-27 DIAGNOSIS — I10 Essential (primary) hypertension: Secondary | ICD-10-CM | POA: Diagnosis not present

## 2017-09-27 DIAGNOSIS — E785 Hyperlipidemia, unspecified: Secondary | ICD-10-CM | POA: Diagnosis present

## 2017-09-27 DIAGNOSIS — R609 Edema, unspecified: Secondary | ICD-10-CM | POA: Diagnosis not present

## 2017-09-27 DIAGNOSIS — M5414 Radiculopathy, thoracic region: Secondary | ICD-10-CM | POA: Diagnosis present

## 2017-09-27 DIAGNOSIS — I2583 Coronary atherosclerosis due to lipid rich plaque: Secondary | ICD-10-CM | POA: Diagnosis present

## 2017-09-27 DIAGNOSIS — F324 Major depressive disorder, single episode, in partial remission: Secondary | ICD-10-CM | POA: Diagnosis present

## 2017-09-27 DIAGNOSIS — G8929 Other chronic pain: Secondary | ICD-10-CM | POA: Diagnosis present

## 2017-09-27 DIAGNOSIS — M549 Dorsalgia, unspecified: Secondary | ICD-10-CM | POA: Diagnosis present

## 2017-09-27 DIAGNOSIS — Z981 Arthrodesis status: Secondary | ICD-10-CM | POA: Diagnosis not present

## 2017-09-27 DIAGNOSIS — N182 Chronic kidney disease, stage 2 (mild): Secondary | ICD-10-CM | POA: Diagnosis present

## 2017-09-27 DIAGNOSIS — K5909 Other constipation: Secondary | ICD-10-CM | POA: Diagnosis present

## 2017-09-27 DIAGNOSIS — D72829 Elevated white blood cell count, unspecified: Secondary | ICD-10-CM | POA: Diagnosis present

## 2017-09-27 DIAGNOSIS — K219 Gastro-esophageal reflux disease without esophagitis: Secondary | ICD-10-CM | POA: Diagnosis present

## 2017-09-27 DIAGNOSIS — Z808 Family history of malignant neoplasm of other organs or systems: Secondary | ICD-10-CM | POA: Diagnosis not present

## 2017-09-27 HISTORY — DX: Chronic kidney disease, stage 3 unspecified: N18.30

## 2017-09-27 LAB — BASIC METABOLIC PANEL
Anion gap: 12 (ref 5–15)
BUN: 12 mg/dL (ref 6–20)
CALCIUM: 9 mg/dL (ref 8.9–10.3)
CO2: 22 mmol/L (ref 22–32)
Chloride: 106 mmol/L (ref 101–111)
Creatinine, Ser: 0.93 mg/dL (ref 0.44–1.00)
GFR calc Af Amer: >60 mL/min (ref >60)
GFR, EST NON AFRICAN AMERICAN: 57 mL/min — AB (ref >60)
GLUCOSE: 104 mg/dL — AB (ref 65–99)
Potassium: 4.1 mmol/L (ref 3.5–5.1)
Sodium: 140 mmol/L (ref 135–145)

## 2017-09-27 LAB — CBC
HEMATOCRIT: 42.9 % (ref 36.0–46.0)
Hemoglobin: 13.9 g/dL (ref 12.0–15.0)
MCH: 31.4 pg (ref 26.0–34.0)
MCHC: 32.4 g/dL (ref 30.0–36.0)
MCV: 97.1 fL (ref 78.0–100.0)
Platelets: 262 10*3/uL (ref 150–400)
RBC: 4.42 MIL/uL (ref 3.87–5.11)
RDW: 13.2 % (ref 11.5–15.5)
WBC: 8.4 10*3/uL (ref 4.0–10.5)

## 2017-09-27 LAB — PROTIME-INR
INR: 0.92
PROTHROMBIN TIME: 12.3 s (ref 11.4–15.2)

## 2017-09-27 MED ORDER — KETOROLAC TROMETHAMINE 30 MG/ML IJ SOLN
15.0000 mg | Freq: Once | INTRAMUSCULAR | Status: AC
  Administered 2017-09-27: 15 mg via INTRAVENOUS
  Filled 2017-09-27: qty 1

## 2017-09-27 NOTE — Progress Notes (Addendum)
RN notified via phone with updates. [employer] fast streamer obtained. Plan is for patient to go to IR on Thursday for Kyphoplasty. Patient and spouse provided updates. Patient aware she will be NPO after MN tonight.

## 2017-09-27 NOTE — Care Management Obs Status (Signed)
[facility] NOTIFICATION   Patient Details  Name: [name] [name] MRN: [id number] Date of Birth: [birth year]   Medicare Observation Status Notification Given:  Yes    Carles Collet, RN 09/27/2017, 10:19 AM

## 2017-09-27 NOTE — Progress Notes (Signed)
Progress Note    BRENDAN GRUWELL  XBD:[id number] DOB: 1935-09-12  DOA: 09/25/2017 PCP: [name], MD    Brief Narrative:   Chief complaint: Acute on chronic lower back pain  Medical records reviewed and are as summarized below:  [name] [name] is an 82 y.o. female with PMH of CAD status post stent, hypertension, bronchitis, and chronic back pain status post kyphoplasty who was admitted 09/25/2017 for evaluation of acute worsening of her chronic lower back pain associated with increased swelling of her feet.  Initial work-up in the ED included plain films of the lumbar spine which were unremarkable.  Admitted for pain management and further work-up.  Assessment/Plan:   Principal Problem:   Low back pain, acute on chronic/acute lumbar radiculopathy secondary to T12 compression fracture patient with known osteoporosis Plain films personally reviewed, negative for acute findings.  MRI showed an acute T12 compression fracture.  Has required IV pain management with hydromorphone every 3 hours as needed--has been taking this fairly frequently (parenteral controlled substances).  Continue Neurontin as well.  IR consultation performed and plan is to proceed with kyphoplasty pending insurance approval.  Active Problems:   Essential hypertension Continue Norvasc, metoprolol and Cozaar.  Blood pressure reasonable.    Coronary artery disease Continue beta-blocker and statin.    GERD Continue Protonix.    Hyperlipidemia Continue Pravachol.    Stage III CKD Creatinine stable.   Family Communication/Anticipated D/C date and plan/Code Status   DVT prophylaxis: Lovenox given 09/25/2017, will hold in anticipation of kyphoplasty and order SCDs. Code Status: Full Code.  Family Communication: Daughter updated at the bedside, husband updated by telephone 09/26/17. Disposition Plan: Continue pain management pending kyphoplasty.  Discharge date depends on decision regarding  kyphoplasty.  Medical Consultants:    Interventional Radiology   Anti-Infectives:    None  Subjective:   Continues to have significant lower back pain requiring IV pain management.  No nausea or vomiting.   Objective:    Vitals:   09/26/17 1600 09/26/17 1828 09/26/17 1950 09/27/17 0200  BP: 136/70 (!) 135/59 138/65 (!) 147/74  Pulse: 70 82 75   Resp: (!) 22 (!) 26    Temp:   99.7 F (37.6 C) 98.2 F (36.8 C)  TempSrc:   Oral Oral  SpO2: 96% 98% 99%   Weight:      Height:        Intake/Output Summary (Last 24 hours) at 09/27/2017 0825 Last data filed at 09/27/2017 0700 Gross per 24 hour  Intake -  Output 975 ml  Net -975 ml   Filed Weights   09/25/17 1808  Weight: 72.6 kg (160 lb)    Exam: General: No acute distress. Cardiovascular: Heart sounds show a regular rate, and rhythm. No gallops or rubs. No murmurs. No JVD. Lungs: Clear to auscultation bilaterally with good air movement. No rales, rhonchi or wheezes. Abdomen: Soft, nontender, nondistended with normal active bowel sounds. No masses. No hepatosplenomegaly. Skin: Warm and dry. No rashes or lesions. Extremities: No clubbing or cyanosis.  1+ lower extremity edema. Pedal pulses 2+.  Data Reviewed:   I have personally reviewed following labs and imaging studies:  Labs: Labs show the following:   Basic Metabolic Panel: Recent Labs  Lab 09/25/17 1843 09/26/17 0559 09/27/17 0158  NA 141  --  140  K 3.8  --  4.1  CL 103  --  106  CO2 24  --  22  GLUCOSE 92  --  104*  BUN 13  --  12  CREATININE 0.94 0.86 0.93  CALCIUM 9.7  --  9.0   GFR Estimated Creatinine Clearance: 48.9 mL/min (by C-G formula based on SCr of 0.93 mg/dL).  CBC: Recent Labs  Lab 09/25/17 1843 09/26/17 0559 09/27/17 0158  WBC 11.8* 7.7 8.4  NEUTROABS 8.2*  --   --   HGB 15.2* 13.5 13.9  HCT 46.4* 41.2 42.9  MCV 95.9 97.2 97.1  PLT 285 251 262   Microbiology Recent Results (from the past 240 hour(s))  Surgical  PCR screen     Status: None   Collection Time: 09/26/17  6:35 PM  Result Value Ref Range Status   MRSA, PCR NEGATIVE NEGATIVE Final   Staphylococcus aureus NEGATIVE NEGATIVE Final    Comment: (NOTE) The Xpert SA Assay (FDA approved for NASAL specimens in patients 31 years of age and older), is one component of a comprehensive surveillance program. It is not intended to diagnose infection nor to guide or monitor treatment. Performed at [facility], [place] [address], [place], [place] [address]     Procedures and diagnostic studies:  Dg Chest 2 View  Result Date: 09/26/2017 CLINICAL DATA:  Leukocytosis EXAM: CHEST - 2 VIEW COMPARISON:  08/09/2017 FINDINGS: Cardiac shadow is mildly enlarged but stable. Aortic calcifications are again seen. The lungs are well aerated bilaterally. No focal infiltrate or sizable effusion is seen. No acute bony abnormality is noted. IMPRESSION: No active cardiopulmonary disease. Electronically Signed   By: Inez Catalina M.D.   On: 09/26/2017 08:34   Dg Lumbar Spine Complete  Result Date: 09/25/2017 CLINICAL DATA:  Back pain and leg swelling EXAM: LUMBAR SPINE - COMPLETE 4+ VIEW COMPARISON:  Lumbar spine radiograph 04/27/2017 . FINDINGS: There is posterior spinal fusion at L1-L3. Vertebral augmentation changes at L2-L4. No new compression fracture. Grade 1 L3-4 retrolisthesis and L4-5 anterolisthesis is unchanged IMPRESSION: Changes of prior lumbar fusion and multilevel vertebral augmentation without acute abnormality. Electronically Signed   By: Ulyses Jarred M.D.   On: 09/25/2017 21:19   Mr Lumbar Spine Wo Contrast  Result Date: 09/26/2017 CLINICAL DATA:  Back pain with risk factors for fracture. EXAM: MRI LUMBAR SPINE WITHOUT CONTRAST TECHNIQUE: Multiplanar, multisequence MR imaging of the lumbar spine was performed. No intravenous contrast was administered. COMPARISON:  Radiography from yesterday.  Abdominal CT 04/05/2017 FINDINGS: Segmentation:  5 lumbar  vertebrae Alignment: Grade 1 anterolisthesis at L4-5, facet mediated. Compression fractures cause a reversal of lordosis at the upper lumbar spine. Vertebrae: T12 compression fracture with marrow edema about a band of trabecular impaction. Based on prior CT there is a pre-existing superior endplate fracture with mild depression. There is no retropulsion or posterior element involvement. Prior L1, L2, L3, and L4 body fractures with cement augmentation at L2-L4. Posterior fusion hardware from L1-L3. Conus medullaris and cauda equina: Conus extends to the T12-L1 level. Conus and cauda equina appear normal. Paraspinal and other soft tissues: Dilated common bile duct, chronic and likely from reservoir effect after cholecystectomy. There is atrophy of intrinsic back muscles. Disc levels: T12- L1: Intervertebral ankylosis from bridging osteophyte. No impingement L1-L2: Posterior-lateral fusion. Facet hypertrophy. No visible impingement L2-L3: Advanced disc narrowing. Posterior element hypertrophy. Limited assessment of the foramina due to artifact. Mild spinal stenosis. L3-L4: Degenerative facet arthropathy narrowing the foramina and causing mild to moderate spinal stenosis. Narrow disc without herniation L4-L5: Advanced facet arthropathy with hypertrophy. The disc is bulging. Noncompressive canal and bilateral foraminal stenosis L5-S1:Laminectomy. Advanced  disc narrowing with mild facet spurring. No compressive stenosis. IMPRESSION: 1. Acute T12 compression fracture. No depression when accounting for a remote superior endplate fracture. No retropulsion. 2. L1-2 intervertebral ankylosis. 3. Remote L1-L4 vertebral fracture. 4. Diffuse degenerative changes that are described above. No high-grade canal stenosis. Electronically Signed   By: Monte Fantasia M.D.   On: 09/26/2017 08:41    Medications:   . amLODipine  10 mg Oral QHS  . diclofenac sodium  4 g Topical QID  . gabapentin  300 mg Oral TID  . losartan  100 mg  Oral Daily  . metoprolol tartrate  50 mg Oral BID  . mometasone-formoterol  2 puff Inhalation Q12H  . mupirocin ointment  1 application Nasal BID  . pantoprazole  40 mg Oral Daily  . polyethylene glycol  17 g Oral BID  . pravastatin  40 mg Oral q1800  . senna-docusate  2 tablet Oral BID  . tamsulosin  0.4 mg Oral QPC supper   Continuous Infusions:   LOS: 0 days   Jacquelynn Cree  [facility] Pager [phone]. If unable to reach me by pager, please call my cell phone at [phone].  *Please refer to [email or web address], password [email or web address] to get updated schedule on who will round on this patient, as hospitalists switch teams weekly. If 7PM-7AM, please contact night-coverage at [email or web address], password [email or web address] for any overnight needs.  09/27/2017, 8:25 AM

## 2017-09-27 NOTE — Consult Note (Signed)
Chief Complaint: Patient was seen in consultation today for thoracic 12 compression fracture.  Referring Physician(s): Rama, Venetia Maxon  Supervising Physician: Luanne Bras  Patient Status: [facility] - In-pt  History of Present Illness: [name] [name] is a 82 y.o. female with a past medical history of hypertension, hyperlipidemia, CAD, TIA 1995, CKD stage III, asthma, osteoporosis, GERD, diverticulosis, and anxiety.  She is known to [facility] and has had multiple kyphoplasties/vertebroplasties with Dr. Estanislado Pandy: 08/20/2013: lumbar 3 vertebroplasty, lumbar 4 kyphoplasty 11/05/2013: repeat lumbar 3 vertebroplasty, repeat lumbar 4 vertebroplasty 06/19/2014: lumbar 2 kyphoplasty  Patient presented to ED 09/25/2017 for low back pain without known injury. She was diagnosed and admitted with a thoracic 12 compression fracture.  MRI lumbar spine 09/26/2017: 1. Acute T12 compression fracture. No depression when accounting for a remote superior endplate fracture. No retropulsion. 2. L1-2 intervertebral ankylosis. 3. Remote L1-L4 vertebral fracture. 4. Diffuse degenerative changes that are described above. No high-grade canal stenosis.  IR consulted by Dr. Rockne Menghini for possible image-guided thoracic 12 kyphoplasty/vertebroplasty. Patient awake and alert laying in bed. Complains of constant midline back pain. Rates pain 9/10. States pain is worse with movement. Denies fever, chest pain, dyspnea, numbness/tingling down legs, or bladder/bowel incontinence.  Past Medical History:  Diagnosis Date  . Anxiety    takes Xanax daily as needed  . Asthma    Albuterol daily as needed  . Blood transfusion without reported diagnosis   . CAD (coronary artery disease)    LHC 2003 with 40% pLAD, 30% mLAD, 100% D1 (moderate vessel), 100%  D2 (small vessel), 95% mid small OM2.  Pt had PTCA to D1;  D2 and OM2 small vessels and tx medically;  Last Myoview (2012): anterior infarct seen with scar, no ischemia. EF  normal. Pt managed medically;  Lexiscan Myoview (12/14):  Low risk; ant scar with very mild peri-infarct ischemia; EF 55% with ant AK   . Cataract   . Chronic back pain    HNP, DDD, spinal stenosis, vertebral compression fractures.   . Chronic nausea    takes Zofran daily as needed.  normal gastric emptying study 03/2013  . CKD (chronic kidney disease), stage III ([place]) 09/27/2017  . Constipation    felt to be functional. takes Senokot and Miralax daily.  treated with Linzess in past.   . DIVERTICULOSIS, COLON 08/28/2008   Qualifier: Diagnosis of  By: [name], [name], [name]    . Elevated LFTs 2015   probably med induced DILI, from Augmentin vs Pravastatin  . GERD (gastroesophageal reflux disease)    hx of esophageal stricture   . Hiatal hernia    Paraesophageal hernias. s/p fundoplications in 1696 and 04/2013  . History of bronchitis several of yrs ago  . History of colon polyps 03/2009, 03/2011   adenomatous colon polyps, no high grade dysplasia.   [name] History of vertigo    no meds  . Hyperlipidemia    takes Pravastatin daily  . Hypertension    takes Amlodipine,Metoprolol,and Losartan daily  . Nausea 03/2016  . Osteoporosis   . PONV (postoperative nausea and vomiting)    yrs ago  . Slow urinary stream    takes Rapaflo daily  . TIA (transient ischemic attack) 1995   no residual effects noted    Past Surgical History:  Procedure Laterality Date  . APPENDECTOMY     patient unsure of date  . BACK SURGERY    . BLADDER REPAIR    . CHOLECYSTECTOMY     Patient unsure  of date.  . CORONARY ANGIOPLASTY  11/16/2001   1 stent  . ESOPHAGEAL MANOMETRY N/A 03/25/2013   Procedure: ESOPHAGEAL MANOMETRY (EM);  Surgeon: Inda Castle, MD;  Location: [facility] ENDOSCOPY;  Service: Endoscopy;  Laterality: N/A;  . ESOPHAGOGASTRODUODENOSCOPY N/A 03/15/2013   Procedure: ESOPHAGOGASTRODUODENOSCOPY (EGD);  Surgeon: Jerene Bears, MD;  Location[facility] ENDOSCOPY;  Service: Endoscopy;  Laterality: N/A;  .  ESOPHAGOGASTRODUODENOSCOPY N/A 12/25/2015   Procedure: ESOPHAGOGASTRODUODENOSCOPY (EGD);  Surgeon: Doran Stabler, MD;  Location: [name] ENDOSCOPY;  Service: Endoscopy;  Laterality: N/A;  . EYE SURGERY  11/2000   bilateral cataracts with lens implant  . FIXATION KYPHOPLASTY LUMBAR SPINE  X 2   "L3-4"  . HEMORRHOID SURGERY    . HIATAL HERNIA REPAIR  06/03/2011   Procedure: LAPAROSCOPIC REPAIR OF HIATAL HERNIA;  Surgeon: Adin Hector, MD;  Location: [facility] ORS;  Service: General;  Laterality: N/A;  . HIATAL HERNIA REPAIR N/A 04/24/2013   Procedure: LAPAROSCOPIC  REPAIR RECURRENT PARASOPHAGEAL HIATAL HERNIA WITH FUNDOPLICATION;  Surgeon: Adin Hector, MD;  Location: [facility] ORS;  Service: General;  Laterality: N/A;  . INSERTION OF MESH N/A 04/24/2013   Procedure: INSERTION OF MESH;  Surgeon: Adin Hector, MD;  Location: [facility] ORS;  Service: General;  Laterality: N/A;  . LAPAROSCOPIC LYSIS OF ADHESIONS N/A 04/24/2013   Procedure: LAPAROSCOPIC LYSIS OF ADHESIONS;  Surgeon: Adin Hector, MD;  Location: [facility] ORS;  Service: General;  Laterality: N/A;  . LAPAROSCOPIC NISSEN FUNDOPLICATION  3/41/9622   Procedure: LAPAROSCOPIC NISSEN FUNDOPLICATION;  Surgeon: Adin Hector, MD;  Location: [facility] ORS;  Service: General;  Laterality: N/A;  . LUMBAR FUSION  12/07/2015   Right L2-3 facetectomy, posterolateral fusion L2-3 with pedicle screws, rods, local bone graft, Vivigen cancellous chips. Fusion extended to the L1 level with pedicle screws and rods  . LUMBAR LAMINECTOMY/DECOMPRESSION MICRODISCECTOMY Right 06/26/2012   Procedure: LUMBAR LAMINECTOMY/DECOMPRESSION MICRODISCECTOMY;  Surgeon: Jessy Oto, MD;  Location: [place];  Service: Orthopedics;  Laterality: Right;  RIGHT L4-5 MICRODISCECTOMY  . LUMBAR LAMINECTOMY/DECOMPRESSION MICRODISCECTOMY N/A 05/29/2015   Procedure: RIGHT L2-3 MICRODISCECTOMY;  Surgeon: Jessy Oto, MD;  Location: [place];  Service: Orthopedics;  Laterality: N/A;  . TOTAL ABDOMINAL  HYSTERECTOMY  1975   partial    Allergies: Ace inhibitors; Aspartame; Atorvastatin; Biaxin [clarithromycin]; Daliresp [roflumilast]; Erythromycin; and Levofloxacin  Medications: Prior to Admission medications   Medication Sig Start Date End Date Taking? Authorizing Provider  acetaminophen (TYLENOL) 325 MG tablet Take 2 tablets (650 mg total) by mouth every 6 (six) hours as needed for mild pain (or Fever >/= 101). 12/26/15  Yes Emokpae, Courage, MD  albuterol (PROAIR HFA) 108 (90 Base) MCG/ACT inhaler Inhale 2 puffs into the lungs every 6 (six) hours as needed for wheezing or shortness of breath. 09/22/16  Yes Vicie Mutters, PA-C  ALPRAZolam Duanne Moron) 0.5 MG tablet Take 1/2 to 1 tablet 2 to 3 x / day ONLY if Anxiety Attack and please try to limit to 5 days /week to avoid addiction 08/14/17  Yes [name], MD  amLODipine (NORVASC) 10 MG tablet Take 1 tablet daily for BP Patient taking differently: Take 10 mg by mouth at bedtime.  09/08/17  Yes [name], MD  Cholecalciferol (VITAMIN D3) 2000 UNITS TABS Take 10,000 Units by mouth daily.    Yes [provider]  diclofenac sodium (VOLTAREN) 1 % GEL Apply 4 g topically 4 (four) times daily. 12/06/16  Yes Jessy Oto, MD  dicyclomine (BENTYL) 10  MG capsule TAKE 1 CAPSULE BY MOUTH EVERY 8 HOURS AS NEEDED FOR SPASM 08/04/17  Yes Armbruster, Carlota Raspberry, MD  gabapentin (NEURONTIN) 300 MG capsule Take 300 mg by mouth 3 (three) times daily.    Yes [provider]  ipratropium-albuterol (DUONEB) 0.5-2.5 (3) MG/3ML SOLN USE ONE VIAL IN NEBULIZER 4 TIMES DAILY Patient taking differently: Take 3 mLs by nebulization every 6 (six) hours as needed (wheezing). USE ONE VIAL IN NEBULIZER 4 TIMES DAILY as needed 08/09/17  Yes Corbett, Caryl Pina, NP  ketoconazole (NIZORAL) 2 % cream APPLY 1 APPLICATION TOPICALLY TWO TIMES DAILY 02/10/17  Yes Vicie Mutters, PA-C  losartan (COZAAR) 100 MG tablet TAKE ONE TABLET BY MOUTH ONCE DAILY (DISCONTINUE   BENICAR) 06/22/17  Yes [name], MD  meclizine (ANTIVERT) 25 MG tablet 1/2-1 pill up to 3 times daily for motion sickness/dizziness 06/08/16  Yes Vicie Mutters, PA-C  metoprolol tartrate (LOPRESSOR) 50 MG tablet TAKE ONE TABLET BY MOUTH TWICE DAILY FOR  BLOOD  PRESSURE Patient taking differently: TAKE ONE TABLET BY MOUTH IN THE EVENING FOR BLOOD  PRESSURE 04/02/17  Yes Vicie Mutters, PA-C  mometasone-formoterol (DULERA) 100-5 MCG/ACT AERO Inhale 2 puffs into the lungs every 12 (twelve) hours. 09/08/17  Yes [name], MD  neomycin-polymyxin b-dexamethasone (MAXITROL) 3.5-10000-0.1 OINT APPLY TO STYE FOUR TIMES DAILY. 07/07/17  Yes Vicie Mutters, PA-C  nitroGLYCERIN (NITROSTAT) 0.4 MG SL tablet Place 1 tablet (0.4 mg total) under the tongue every 5 (five) minutes as needed for chest pain. 09/04/14  Yes Larey Dresser, MD  OVER THE COUNTER MEDICATION Take 1,000 mg by mouth 2 (two) times daily. [place] Calcium 1000mg    Yes [provider]  oxyCODONE-acetaminophen (PERCOCET/ROXICET) 5-325 MG tablet Take 1 tablet by mouth every 4 (four) hours as needed for severe pain. 03/10/17  Yes Tegeler, Gwenyth Allegra, MD  pantoprazole (PROTONIX) 40 MG tablet Take 1 tablet (40 mg total) daily by mouth. 03/23/17  Yes Vicie Mutters, PA-C  polyethylene glycol powder (GLYCOLAX/MIRALAX) powder Take 17 g by mouth 2 (two) times daily. 05/16/17  Yes Armbruster, Carlota Raspberry, MD  pravastatin (PRAVACHOL) 40 MG tablet Take 1 tablet (40 mg total) by mouth daily. 07/31/17  Yes [name], MD  promethazine (PHENERGAN) 25 MG tablet Take 1 tablet (25 mg total) by mouth every 6 (six) hours as needed for nausea or vomiting. 09/04/17  Yes [name], MD  senna-docusate (SENOKOT-S) 8.6-50 MG tablet Take 2 tablets by mouth 2 (two) times daily. 12/26/15  Yes Emokpae, Courage, MD  silodosin (RAPAFLO) 8 MG CAPS capsule Take 8 mg by mouth daily with breakfast.    Yes [provider]  amLODipine (NORVASC) 10 MG  tablet TAKE 1 TABLET BY MOUTH AT BEDTIME Patient not taking: Reported on 09/25/2017 09/08/17   [name], MD  potassium chloride SA (K-DUR,KLOR-CON) 20 MEQ tablet Take 1 tablet (20 mEq total) by mouth 2 (two) times daily. 11/25/14   [name], MD  risedronate (ACTONEL) 150 MG tablet Take 1 tablet (150 mg total) by mouth every 30 (thirty) days. with water on empty stomach, nothing by mouth or lie down for next 30 minutes. Patient not taking: Reported on 09/25/2017 07/25/17   [name], MD  tamsulosin (FLOMAX) 0.4 MG CAPS capsule Take 0.4 mg by mouth daily. 09/12/17   [provider]     Family History  Problem Relation Age of Onset  . Colon cancer Mother 87  . Heart disease Father 10       heart  attack  . Brain cancer Brother        tumor   . Heart disease Brother   . Heart disease Brother   . Kidney disease Daughter     Social History   Socioeconomic History  . Marital status: Married    Spouse name: [name]  . Number of children: 2  . Years of education: 40  . Highest education level: Not on file  Occupational History  . Occupation: Retired    Fish farm manager: RETIRED    Comment: worked at [employer]  . Financial resource strain: Not on file  . Food insecurity:    Worry: Not on file    Inability: Not on file  . Transportation needs:    Medical: Not on file    Non-medical: Not on file  Tobacco Use  . Smoking status: Never Smoker  . Smokeless tobacco: Never Used  Substance and Sexual Activity  . Alcohol use: No  . Drug use: No  . Sexual activity: Never  Lifestyle  . Physical activity:    Days per week: Not on file    Minutes per session: Not on file  . Stress: Not on file  Relationships  . Social connections:    Talks on phone: Not on file    Gets together: Not on file    Attends religious service: Not on file    Active member of club or organization: Not on file    Attends meetings of clubs or organizations: Not on file     Relationship status: Not on file  Other Topics Concern  . Not on file  Social History Narrative   Lives with husband.   Right-handed.   3 cups caffeine per day.     Review of Systems: A 12 point ROS discussed and pertinent positives are indicated in the HPI above.  All other systems are negative.  Review of Systems  Constitutional: Negative for activity change and fever.  Respiratory: Negative for shortness of breath and wheezing.   Cardiovascular: Negative for chest pain and palpitations.  Gastrointestinal:       Negative for bowel incontinence.  Genitourinary:       Negative for bladder incontinence.  Musculoskeletal: Positive for back pain.  Neurological: Negative for numbness.  Psychiatric/Behavioral: Negative for behavioral problems and confusion.    Vital Signs: BP 132/72   Pulse 97   Temp 98.4 F (36.9 C) (Oral)   Resp (!) 21   Ht 5\' 5"  (1.651 m)   Wt 160 lb (72.6 kg)   SpO2 96%   BMI 26.63 kg/m   Physical Exam  Constitutional: She is oriented to person, place, and time. She appears well-developed and well-nourished. No distress.  Cardiovascular: Normal rate, regular rhythm, normal heart sounds and intact distal pulses.  No murmur heard. Pulmonary/Chest: Effort normal and breath sounds normal. No respiratory distress. She has no wheezes.  Musculoskeletal:  Moderate tenderness of midline thoracic spine.  Neurological: She is alert and oriented to person, place, and time.  Skin: Skin is warm and dry.  Psychiatric: She has a normal mood and affect. Her behavior is normal. Judgment and thought content normal.  Nursing note and vitals reviewed.    MD Evaluation Airway: WNL Heart: WNL Abdomen: WNL Chest/ Lungs: WNL ASA  Classification: 2 Mallampati/Airway Score: One   Imaging: Dg Chest 2 View  Result Date: 09/26/2017 CLINICAL DATA:  Leukocytosis EXAM: CHEST - 2 VIEW COMPARISON:  08/09/2017 FINDINGS: Cardiac shadow is mildly enlarged but  stable. Aortic  calcifications are again seen. The lungs are well aerated bilaterally. No focal infiltrate or sizable effusion is seen. No acute bony abnormality is noted. IMPRESSION: No active cardiopulmonary disease. Electronically Signed   By: Inez Catalina M.D.   On: 09/26/2017 08:34   Dg Lumbar Spine Complete  Result Date: 09/25/2017 CLINICAL DATA:  Back pain and leg swelling EXAM: LUMBAR SPINE - COMPLETE 4+ VIEW COMPARISON:  Lumbar spine radiograph 04/27/2017 . FINDINGS: There is posterior spinal fusion at L1-L3. Vertebral augmentation changes at L2-L4. No new compression fracture. Grade 1 L3-4 retrolisthesis and L4-5 anterolisthesis is unchanged IMPRESSION: Changes of prior lumbar fusion and multilevel vertebral augmentation without acute abnormality. Electronically Signed   By: Ulyses Jarred M.D.   On: 09/25/2017 21:19   Mr Lumbar Spine Wo Contrast  Result Date: 09/26/2017 CLINICAL DATA:  Back pain with risk factors for fracture. EXAM: MRI LUMBAR SPINE WITHOUT CONTRAST TECHNIQUE: Multiplanar, multisequence MR imaging of the lumbar spine was performed. No intravenous contrast was administered. COMPARISON:  Radiography from yesterday.  Abdominal CT 04/05/2017 FINDINGS: Segmentation:  5 lumbar vertebrae Alignment: Grade 1 anterolisthesis at L4-5, facet mediated. Compression fractures cause a reversal of lordosis at the upper lumbar spine. Vertebrae: T12 compression fracture with marrow edema about a band of trabecular impaction. Based on prior CT there is a pre-existing superior endplate fracture with mild depression. There is no retropulsion or posterior element involvement. Prior L1, L2, L3, and L4 body fractures with cement augmentation at L2-L4. Posterior fusion hardware from L1-L3. Conus medullaris and cauda equina: Conus extends to the T12-L1 level. Conus and cauda equina appear normal. Paraspinal and other soft tissues: Dilated common bile duct, chronic and likely from reservoir effect after cholecystectomy.  There is atrophy of intrinsic back muscles. Disc levels: T12- L1: Intervertebral ankylosis from bridging osteophyte. No impingement L1-L2: Posterior-lateral fusion. Facet hypertrophy. No visible impingement L2-L3: Advanced disc narrowing. Posterior element hypertrophy. Limited assessment of the foramina due to artifact. Mild spinal stenosis. L3-L4: Degenerative facet arthropathy narrowing the foramina and causing mild to moderate spinal stenosis. Narrow disc without herniation L4-L5: Advanced facet arthropathy with hypertrophy. The disc is bulging. Noncompressive canal and bilateral foraminal stenosis L5-S1:Laminectomy. Advanced disc narrowing with mild facet spurring. No compressive stenosis. IMPRESSION: 1. Acute T12 compression fracture. No depression when accounting for a remote superior endplate fracture. No retropulsion. 2. L1-2 intervertebral ankylosis. 3. Remote L1-L4 vertebral fracture. 4. Diffuse degenerative changes that are described above. No high-grade canal stenosis. Electronically Signed   By: Monte Fantasia M.D.   On: 09/26/2017 08:41    Labs:  CBC: Recent Labs    09/04/17 1031 09/25/17 1843 09/26/17 0559 09/27/17 0158  WBC 6.7 11.8* 7.7 8.4  HGB 15.2 15.2* 13.5 13.9  HCT 44.2 46.4* 41.2 42.9  PLT 272 285 251 262    COAGS: Recent Labs    09/04/17 1031  INR 0.9    BMP: Recent Labs    04/12/17 1153 07/25/17 0952 09/25/17 1843 09/26/17 0559 09/27/17 0158  NA 140 141 141  --  140  K 5.2 4.4 3.8  --  4.1  CL 103 106 103  --  106  CO2 27 27 24   --  22  GLUCOSE 99 112* 92  --  104*  BUN 15 17 13   --  12  CALCIUM 10.2 10.2 9.7  --  9.0  CREATININE 0.91 0.98* 0.94 0.86 0.93  GFRNONAA 60 55* 56* >60 57*  GFRAA 70 64 >60 >60 >60  LIVER FUNCTION TESTS: Recent Labs    09/30/16 1025  03/10/17 0810 03/23/17 1120 04/12/17 1153 07/25/17 0952  BILITOT 0.5   < > 1.5* 0.7 0.7 0.9  AST 17   < > 28 15 20 23   ALT 15   < > 20 18 17  30*  ALKPHOS 55  --  61  --   --    --   PROT 6.6   < > 8.5* 7.1 6.9 6.9  ALBUMIN 3.8  --  4.9  --   --   --    < > = values in this interval not displayed.    TUMOR MARKERS: No results for input(s): AFPTM, CEA, CA199, CHROMGRNA in the last 8760 hours.  Assessment and Plan:  Thoracic 12 compression fracture. Plan for image-guided thoracic 12 kyphoplasty/vertebroplasty with Dr. Estanislado Pandy pending insurance approval. Patient will be NPO midnight before procedure. Denies fever and WBCs WNL. She does not take blood thinners. INR pending.  Risks and benefits of thoracic 12 kyphoplasty/vertebroplasty were discussed with the patient including, but not limited to education regarding the natural healing process of compression fractures without intervention, bleeding, infection, cement migration which may cause spinal cord damage, paralysis, pulmonary embolism or even death. This interventional procedure involves the use of X-rays and because of the nature of the planned procedure, it is possible that we will have prolonged use of X-ray fluoroscopy. Potential radiation risks to you include (but are not limited to) the following: - A slightly elevated risk for cancer  several years later in life. This risk is typically less than 0.5% percent. This risk is low in comparison to the normal incidence of human cancer, which is 33% for women and 50% for men according to the [place]. - Radiation induced injury can include skin redness, resembling a rash, tissue breakdown / ulcers and hair loss (which can be temporary or permanent).  The likelihood of either of these occurring depends on the difficulty of the procedure and whether you are sensitive to radiation due to previous procedures, disease, or genetic conditions.  IF your procedure requires a prolonged use of radiation, you will be notified and given written instructions for further action.  It is your responsibility to monitor the irradiated area for the 2 weeks following  the procedure and to notify your physician if you are concerned that you have suffered a radiation induced injury.   All of the patient's questions were answered, patient is agreeable to proceed. Consent signed and in chart.  Thank you for this interesting consult.  I greatly enjoyed meeting OVIE EASTEP and look forward to participating in their care.  A copy of this report was sent to the requesting provider on this date.  Electronically Signed: Earley Abide, PA-C 09/27/2017, 10:57 AM   I spent a total of 20 Minutes in face to face in clinical consultation, greater than 50% of which was counseling/coordinating care for thoracic 12 compression fracture.

## 2017-09-28 ENCOUNTER — Inpatient Hospital Stay (HOSPITAL_COMMUNITY): Payer: PPO

## 2017-09-28 ENCOUNTER — Ambulatory Visit (INDEPENDENT_AMBULATORY_CARE_PROVIDER_SITE_OTHER): Payer: PPO | Admitting: Surgery

## 2017-09-28 DIAGNOSIS — R609 Edema, unspecified: Secondary | ICD-10-CM

## 2017-09-28 MED ORDER — HYDROMORPHONE HCL 1 MG/ML IJ SOLN
1.0000 mg | Freq: Once | INTRAMUSCULAR | Status: AC
  Administered 2017-09-28: 1 mg via INTRAVENOUS
  Filled 2017-09-28: qty 1

## 2017-09-28 NOTE — Progress Notes (Signed)
Preliminary results by tech - Venous duplex lower ext completed. Negative for deep and superficial vein thrombosis in both legs. Oda Cogan, BS, RDMS, RVT

## 2017-09-28 NOTE — Evaluation (Addendum)
Occupational Therapy Evaluation Patient Details Name: [name] [name] MRN: [id number] DOB: September 05, 1935 Today's Date: 09/28/2017    History of Present Illness [name] [name] is an 82 y.o. female with PMH of CAD status post stent, hypertension, bronchitis, and chronic back pain status post kyphoplasty who was admitted 09/25/2017 for evaluation of acute worsening of her chronic lower back pain associated with increased swelling of her feet.  Found to have acute T12 compression fracture.    Clinical Impression   This 81 y/o female presents with the above. At baseline pt reports mod independence with ADLs and functional mobility, lives with spouse. Pt limited by pain this session, requiring modA for bed mobility and minguard assist for static sitting EOB. Began education and review of back precautions and compensatory techniques for completing ADLs with pt verbalizing understanding. Feel pt will progress well once pain levels are better controlled. Of note pt planning for procedure later this afternoon. Pt will benefit from continued acute OT services and recommend follow up [facility] services after discharge to maximize her safety and independence with ADLs and mobility.     Follow Up Recommendations  Home health OT;Supervision/Assistance - 24 hour    Equipment Recommendations  None recommended by OT;Other (comment)(pt's DME needs are met )           Precautions / Restrictions Precautions Precautions: Fall;Back Precaution Comments: verbally reviewed with pt       Mobility Bed Mobility Overal bed mobility: Needs Assistance Bed Mobility: Rolling;Sidelying to Sit;Sit to Sidelying Rolling: Min guard Sidelying to sit: Mod assist     Sit to sidelying: Min assist General bed mobility comments: min verbal cues for log roll with pt overall having good recall of technique; modA to elevate trunk into sitting and to steady initially; light minA for bringing LEs onto EOB when returning to sidelying    Transfers                      Balance Overall balance assessment: Needs assistance   Sitting balance-Leahy Scale: Good                                     ADL either performed or assessed with clinical judgement   ADL Overall ADL's : Needs assistance/impaired Eating/Feeding: Modified independent;Sitting   Grooming: Set up;Sitting   Upper Body Bathing: Minimal assistance;Sitting       Upper Body Dressing : Minimal assistance;Sitting                     General ADL Comments: pt limited by pain this session, able to complete bed mobility and sit EOB with minguard, though unable to attempt standing due to increased pain levels and pt feeling nauseous as pt has been NPO today for planned procedure; verbally reviewed back precautions and compensatory strategies for completing ADLs, will need further review and practice. Pt reports she has previously been able to safely utilize figure 4 technique for completion of LB ADLs and has not required use of AE, will need to further assess after procedure (planned for this afternoon.)                         Pertinent Vitals/Pain Pain Assessment: Faces Faces Pain Scale: Hurts whole lot Pain Location: back pain with movement  Pain Descriptors / Indicators: Constant;Grimacing;Guarding;Discomfort Pain Intervention(s): Monitored during session;Limited activity within  patient's tolerance;Repositioned     Hand Dominance Right   Extremity/Trunk Assessment Upper Extremity Assessment Upper Extremity Assessment: Generalized weakness       Cervical / Trunk Assessment Cervical / Trunk Assessment: Kyphotic;Other exceptions Cervical / Trunk Exceptions: kyphoscoliotic with h/o multiple compression fractures   Communication Communication Communication: 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   Cognition Arousal/Alertness: Awake/alert Behavior During Therapy: WFL for tasks assessed/performed Overall Cognitive Status: Within  Functional Limits for tasks assessed                                                      Home Living Family/patient expects to be discharged to:: Private residence Living Arrangements: Spouse/significant other Available Help at Discharge: Family;Available 24 hours/day Type of Home: House Home Access: Stairs to enter [employer] of Steps: 3 in back Entrance Stairs-Rails: None Home Layout: One level     Bathroom Shower/Tub: Occupational psychologist: Handicapped height     Home Equipment: Environmental consultant - 2 wheels;Walker - 4 wheels;Shower seat;Grab bars - tub/shower;Grab bars - toilet          Prior Functioning/Environment Level of Independence: Independent with assistive device(s)        Comments: using SPC or RW         OT Problem List: Decreased strength;Impaired balance (sitting and/or standing);Pain;Decreased activity tolerance      OT Treatment/Interventions: Self-care/ADL training;DME and/or AE instruction;Therapeutic activities;Balance training;Therapeutic exercise;Patient/family education    OT Goals(Current goals can be found in the care plan section) Acute Rehab OT Goals Patient Stated Goal: to have procedure hopefully soon OT Goal Formulation: With patient Time For Goal Achievement: 10/12/17 Potential to Achieve Goals: Good ADL Goals Pt Will Perform Grooming: with supervision;standing Pt Will Perform Upper Body Bathing: with supervision;sitting Pt Will Perform Lower Body Bathing: with supervision;sit to/from stand;with adaptive equipment(AE PRN) Pt Will Perform Lower Body Dressing: with supervision;sit to/from stand;with adaptive equipment(AE PRN) Pt Will Transfer to Toilet: with supervision;ambulating;regular height toilet Pt Will Perform Toileting - Clothing Manipulation and hygiene: with supervision;sit to/from stand Pt Will Perform Tub/Shower Transfer: Shower transfer;ambulating;shower seat;rolling walker Additional  ADL Goal #1: Pt will independently verbalize 3/3 back precautions.  OT Frequency: Min 2X/week                             AM-PAC PT "6 Clicks" Daily Activity     Outcome Measure Help from another person eating meals?: None Help from another person taking care of personal grooming?: A Little Help from another person toileting, which includes using toliet, bedpan, or urinal?: A Lot Help from another person bathing (including washing, rinsing, drying)?: A Lot Help from another person to put on and taking off regular upper body clothing?: A Lot Help from another person to put on and taking off regular lower body clothing?: A Lot 6 Click Score: 15   End of Session Nurse Communication: Mobility status  Activity Tolerance: Patient limited by pain Patient left: in bed;with call bell/phone within reach;with family/visitor present  OT Visit Diagnosis: Other abnormalities of gait and mobility (R26.89);Pain Pain - part of body: (back )                Time: 9357-0177 OT Time Calculation (min): 17 min Charges:  OT General Charges $OT Visit: 1 Visit OT Evaluation $  OT Eval Moderate Complexity: 1 Mod G-Codes:     Lou Cal, OT Pager [phone] 09/28/2017   Raymondo Band 09/28/2017, 5:00 PM

## 2017-09-28 NOTE — Progress Notes (Addendum)
PIV appears to be red at insertion site. No blood return noted, RN attempted to place addition IV with no success. Patient requesting the RN call the IV team as she states..." I'm a hard stick and you won't be able to get it, honey." RN placed order in Epic for IV Team Consult. RN noted in the comment session IV needed for prior to patient going to IR for Kyphoplasty w/Bone Biopsy. RN awaits.

## 2017-09-28 NOTE — Progress Notes (Signed)
Progress Note    [name] [name]  EUM:[id number] DOB: [birth year]  DOA: 09/25/2017 PCP: [name], MD    Brief Narrative:   Chief complaint: Acute on chronic lower back pain  Medical records reviewed and are as summarized below:  [name] [name] is an 82 y.o. female with PMH of CAD status post stent, hypertension, bronchitis, and chronic back pain status post kyphoplasty who was admitted 09/25/2017 for evaluation of acute worsening of her chronic lower back pain associated with increased swelling of her feet.  Initial work-up in the ED included plain films of the lumbar spine which were unremarkable.  Admitted for pain management and further work-up.  Assessment/Plan:   Principal Problem:   Low back pain, acute on chronic/acute lumbar radiculopathy secondary to T12 compression fracture patient with known osteoporosis Plain films negative for acute findings.  MRI showed an acute T12 compression fracture.  Has required IV pain management with hydromorphone every 3 hours as needed--continues to take this fairly frequently (parenteral controlled substances).  RN reports she has not taken Neurontin x 1 week and has been refusing here so will discontinue.  IR consultation performed and plan is to proceed with kyphoplasty today.  Active Problems:   Essential hypertension Continue Norvasc, metoprolol and Cozaar.  Blood pressure controlled.    Coronary artery disease Continue beta-blocker and statin.    GERD Continue Protonix.    Hyperlipidemia Continue Pravachol.    Stage III CKD Creatinine stable.   Family Communication/Anticipated D/C date and plan/Code Status   DVT prophylaxis: Lovenox given 09/25/2017, will hold in anticipation of kyphoplasty and order SCDs. Code Status: Full Code.  Family Communication: Husband updated at the bedside. Disposition Plan: Continue pain management pending kyphoplasty. Likely d/c 09/29/17.  Medical Consultants:    Interventional  Radiology   Anti-Infectives:    None  Subjective:   Reports ongoing severe lower back pain.  No nausea or vomiting.  Objective:    Vitals:   09/28/17 0014 09/28/17 0443 09/28/17 0800 09/28/17 0826  BP: (!) 150/85 (!) 90/47 139/63 (!) 139/6  Pulse: 62 67 70 70  Resp:      Temp: 98.3 F (36.8 C) 99 F (37.2 C) 98.1 F (36.7 C)   TempSrc: Oral Oral Oral   SpO2: 96% 95% 96%   Weight:      Height:        Intake/Output Summary (Last 24 hours) at 09/28/2017 0838 Last data filed at 09/27/2017 0957 Gross per 24 hour  Intake 240 ml  Output -  Net 240 ml   Filed Weights   09/25/17 1808  Weight: 72.6 kg (160 lb)    Exam: General: Uncomfortable appearing. Cardiovascular: Heart sounds show a regular rate, and rhythm. No gallops or rubs. No murmurs. No JVD. Lungs: Clear to auscultation bilaterally with good air movement. No rales, rhonchi or wheezes. Abdomen: Soft, nontender, nondistended with normal active bowel sounds. No masses. No hepatosplenomegaly. Skin: Warm and dry. No rashes or lesions. Extremities: No clubbing or cyanosis. No edema. Pedal pulses 2+.  Data Reviewed:   I have personally reviewed following labs and imaging studies:  Labs: Labs show the following:   Basic Metabolic Panel: Recent Labs  Lab 09/25/17 1843 09/26/17 0559 09/27/17 0158  NA 141  --  140  K 3.8  --  4.1  CL 103  --  106  CO2 24  --  22  GLUCOSE 92  --  104*  BUN 13  --  12  CREATININE 0.94 0.86 0.93  CALCIUM 9.7  --  9.0   GFR Estimated Creatinine Clearance: 48.9 mL/min (by C-G formula based on SCr of 0.93 mg/dL).  CBC: Recent Labs  Lab 09/25/17 1843 09/26/17 0559 09/27/17 0158  WBC 11.8* 7.7 8.4  NEUTROABS 8.2*  --   --   HGB 15.2* 13.5 13.9  HCT 46.4* 41.2 42.9  MCV 95.9 97.2 97.1  PLT 285 251 262   Microbiology Recent Results (from the past 240 hour(s))  Surgical PCR screen     Status: None   Collection Time: 09/26/17  6:35 PM  Result Value Ref Range Status     MRSA, PCR NEGATIVE NEGATIVE Final   Staphylococcus aureus NEGATIVE NEGATIVE Final    Comment: (NOTE) The Xpert SA Assay (FDA approved for NASAL specimens in patients 85 years of age and older), is one component of a comprehensive surveillance program. It is not intended to diagnose infection nor to guide or monitor treatment. Performed at [facility], [place] [address], [place], [place] [address]     Procedures and diagnostic studies:  No results found.  Medications:   . amLODipine  10 mg Oral QHS  . diclofenac sodium  4 g Topical QID  . gabapentin  300 mg Oral TID  . losartan  100 mg Oral Daily  . metoprolol tartrate  50 mg Oral BID  . mometasone-formoterol  2 puff Inhalation Q12H  . mupirocin ointment  1 application Nasal BID  . pantoprazole  40 mg Oral Daily  . polyethylene glycol  17 g Oral BID  . pravastatin  40 mg Oral q1800  . senna-docusate  2 tablet Oral BID  . tamsulosin  0.4 mg Oral QPC supper   Continuous Infusions:   LOS: 1 day   Jacquelynn Cree  [facility] Pager [phone]. If unable to reach me by pager, please call my cell phone at [phone].  *Please refer to [email or web address], password [email or web address] to get updated schedule on who will round on this patient, as hospitalists switch teams weekly. If 7PM-7AM, please contact night-coverage at [email or web address], password [email or web address] for any overnight needs.  09/28/2017, 8:38 AM

## 2017-09-28 NOTE — Progress Notes (Addendum)
RN notified via phone from [place] with IR. Per this notification, RN informed patient's procedure (Kyphoplasty w/Bone Biopsy) has now been postponed until Friday. Patient may resume previous diet and return to NPO status at [place]. RN delivered news to patient and spouse (who has been at patient's bedside since 0830). RN requesting that someone from Ortho please come to speak with patient and family to provide an explanation.

## 2017-09-28 NOTE — Progress Notes (Signed)
Spoke with pts nurse. Unable to do pts procedure today due to scheduling. Pt can eat tonight and be NPO after midnight for procedure tomorrow.

## 2017-09-29 ENCOUNTER — Inpatient Hospital Stay (HOSPITAL_COMMUNITY): Payer: PPO

## 2017-09-29 DIAGNOSIS — S22080A Wedge compression fracture of T11-T12 vertebra, initial encounter for closed fracture: Secondary | ICD-10-CM

## 2017-09-29 HISTORY — PX: IR KYPHO THORACIC WITH BONE BIOPSY: IMG5518

## 2017-09-29 MED ORDER — MIDAZOLAM HCL 2 MG/2ML IJ SOLN
INTRAMUSCULAR | Status: AC
  Filled 2017-09-29: qty 2

## 2017-09-29 MED ORDER — SODIUM CHLORIDE 0.9 % IV SOLN
INTRAVENOUS | Status: AC
  Administered 2017-09-29: 13:00:00 via INTRAVENOUS

## 2017-09-29 MED ORDER — CEFAZOLIN SODIUM-DEXTROSE 2-4 GM/100ML-% IV SOLN
INTRAVENOUS | Status: AC
  Administered 2017-09-29: 2000 mg via INTRAVENOUS
  Filled 2017-09-29: qty 100

## 2017-09-29 MED ORDER — TOBRAMYCIN SULFATE 1.2 G IJ SOLR
INTRAMUSCULAR | Status: AC | PRN
  Administered 2017-09-29: 1.2 g via TOPICAL

## 2017-09-29 MED ORDER — BUPIVACAINE HCL (PF) 0.25 % IJ SOLN
INTRAMUSCULAR | Status: AC | PRN
  Administered 2017-09-29: 30 mL

## 2017-09-29 MED ORDER — CEFAZOLIN SODIUM-DEXTROSE 2-4 GM/100ML-% IV SOLN
2.0000 g | INTRAVENOUS | Status: AC
  Administered 2017-09-29: 2000 mg via INTRAVENOUS

## 2017-09-29 MED ORDER — IOPAMIDOL (ISOVUE-300) INJECTION 61%
INTRAVENOUS | Status: AC
  Administered 2017-09-29: 1 mL
  Filled 2017-09-29: qty 50

## 2017-09-29 MED ORDER — FENTANYL CITRATE (PF) 100 MCG/2ML IJ SOLN
INTRAMUSCULAR | Status: AC | PRN
  Administered 2017-09-29 (×2): 25 ug via INTRAVENOUS

## 2017-09-29 MED ORDER — FENTANYL CITRATE (PF) 100 MCG/2ML IJ SOLN
INTRAMUSCULAR | Status: AC
  Filled 2017-09-29: qty 2

## 2017-09-29 MED ORDER — ONDANSETRON HCL 4 MG/2ML IJ SOLN
INTRAMUSCULAR | Status: AC
  Administered 2017-09-29: 4 mg
  Filled 2017-09-29: qty 2

## 2017-09-29 MED ORDER — OXYCODONE-ACETAMINOPHEN 5-325 MG PO TABS
1.0000 | ORAL_TABLET | ORAL | Status: DC | PRN
  Administered 2017-09-29 – 2017-09-30 (×3): 2 via ORAL
  Filled 2017-09-29 (×4): qty 2

## 2017-09-29 MED ORDER — HYDROMORPHONE HCL 1 MG/ML IJ SOLN
INTRAMUSCULAR | Status: AC | PRN
  Administered 2017-09-29 (×2): 1 mg via INTRAVENOUS

## 2017-09-29 MED ORDER — HYDROMORPHONE HCL 1 MG/ML IJ SOLN
1.0000 mg | INTRAMUSCULAR | Status: DC | PRN
  Administered 2017-09-29 – 2017-09-30 (×2): 1 mg via INTRAVENOUS
  Filled 2017-09-29 (×2): qty 1

## 2017-09-29 MED ORDER — HYDROMORPHONE HCL 2 MG/ML IJ SOLN
INTRAMUSCULAR | Status: AC
  Filled 2017-09-29: qty 1

## 2017-09-29 MED ORDER — TOBRAMYCIN SULFATE 1.2 G IJ SOLR
INTRAMUSCULAR | Status: AC
  Filled 2017-09-29: qty 1.2

## 2017-09-29 MED ORDER — BUPIVACAINE HCL (PF) 0.5 % IJ SOLN
INTRAMUSCULAR | Status: AC
  Filled 2017-09-29: qty 30

## 2017-09-29 MED ORDER — MIDAZOLAM HCL 2 MG/2ML IJ SOLN
INTRAMUSCULAR | Status: AC | PRN
  Administered 2017-09-29 (×2): 1 mg via INTRAVENOUS

## 2017-09-29 NOTE — Care Management Note (Signed)
Case Management Note  Patient Details  Name: [name] [name] MRN: [id number] Date of Birth: 1935-10-07  Subjective/Objective:    Pt admitted on 09/25/17 with back pain; found to have acute T12 compression fx. PTA, pt independent with assistive devices; lives with spouse.                Action/Plan: Pt s/p kyphoplasty today, 5/24; PT/OT recommending HH follow up.  Will follow for [place] orders.  Expected Discharge Date:                  Expected Discharge Plan:  [place]  In-House Referral:     Discharge planning Services  CM Consult  Post Acute Care Choice:    Choice offered to:     DME Arranged:    DME Agency:     HH Arranged:    [place] Agency:     Status of Service:  In process, will continue to follow  If discussed at Long Length of Stay Meetings, dates discussed:    Additional Comments:  Reinaldo Raddle, RN, BSN  Trauma/Neuro ICU Case Manager [phone]

## 2017-09-29 NOTE — Progress Notes (Signed)
Patient back from IR in NAD. VS stable and patient free from pain.

## 2017-09-29 NOTE — Progress Notes (Signed)
OT Cancellation Note  Patient Details Name: [name] [name] MRN: [id number] DOB: 21-Mar-1936   Cancelled Treatment:    Reason Eval/Treat Not Completed: Patient at procedure or test/ unavailable. Pt off floor for kyphoplasty.  Almon Register [phone] 09/29/2017, 11:30 AM

## 2017-09-29 NOTE — Progress Notes (Signed)
PROGRESS NOTE    [name] [name]  ONG:[id number] DOB: 11/16/1935 DOA: 09/25/2017 PCP: [name], MD Brief Narrative:82 y.o. female with PMH of CAD status post stent, hypertension, bronchitis, and chronic back pain status post kyphoplasty who was admitted 09/25/2017 for evaluation of acute worsening of her chronic lower back pain associated with increased swelling of her feet.  Initial work-up in the ED included plain films of the lumbar spine which were unremarkable.  Admitted for pain management and further work-up.    Assessment & Plan:   Principal Problem:   T12 compression fracture (HCC) Active Problems:   Essential hypertension   Osteoporosis   Coronary artery disease due to lipid rich plaque   Depression, major, in partial remission (HCC)   GERD    Lumbar radiculopathy, acute   Low back pain   CKD (chronic kidney disease), stage III (HCC) Low back pain, acute on chronic/acute lumbar radiculopathy secondary to T12 compression fracture patient with known osteoporosis Plain films negative for acute findings.  MRI showed an acute T12 compression fracture.  Has required IV pain management with hydromorphone every 3 hours as needed--continues to take this fairly frequently (parenteral controlled substances).  RN reports she has not taken Neurontin x 1 week and has been refusing here so will discontinue.  IR consultation performed and plan is to proceed with kyphoplasty today.  Restart home dose of Percocet.  Continue Dilaudid.  Active Problems:   Essential hypertension Continue Norvasc, metoprolol and Cozaar.  Blood pressure controlled.    Coronary artery disease Continue beta-blocker and statin.    GERD Continue Protonix.    Hyperlipidemia Continue Pravachol  Stage III CKD Creatinine stable.      DVT prophylaxis: SCD Code Status: Full code  Family Communication: None Disposition Plan: Plan discharge in 24 to 48 hours.  Consultants: ir   Procedures:  None Antimicrobials: None  Subjective: Patient awake alert complaining of pain asking for pain medication.   Objective: Vitals:   09/29/17 0358 09/29/17 0500 09/29/17 0830 09/29/17 0844  BP: (!) 93/54  (!) 126/96   Pulse: 60  73   Resp:      Temp: 98.7 F (37.1 C)   97.7 F (36.5 C)  TempSrc: Oral   Oral  SpO2: 95%  95%   Weight:  73.3 kg (161 lb 9.6 oz)    Height:        Intake/Output Summary (Last 24 hours) at 09/29/2017 1058 Last data filed at 09/28/2017 1837 Gross per 24 hour  Intake 120 ml  Output -  Net 120 ml   Filed Weights   09/25/17 1808 09/29/17 0500  Weight: 72.6 kg (160 lb) 73.3 kg (161 lb 9.6 oz)    Examination:  General exam: Appears calm and comfortable  Respiratory system: Clear to auscultation. Respiratory effort normal. Cardiovascular system: S1 & S2 heard, RRR. No JVD, murmurs, rubs, gallops or clicks. No pedal edema. Gastrointestinal system: Abdomen is nondistended, soft and nontender. No organomegaly or masses felt. Normal bowel sounds heard. Central nervous system: Alert and oriented. No focal neurological deficits. Extremities: Symmetric 5 x 5 power. Skin: No rashes, lesions or ulcers Psychiatry: Judgement and insight appear normal. Mood & affect appropriate.     Data Reviewed: I have personally reviewed following labs and imaging studies  CBC: Recent Labs  Lab 09/25/17 1843 09/26/17 0559 09/27/17 0158  WBC 11.8* 7.7 8.4  NEUTROABS 8.2*  --   --   HGB 15.2* 13.5 13.9  HCT 46.4* 41.2 42.9  MCV 95.9 97.2 97.1  PLT 285 251 638   Basic Metabolic Panel: Recent Labs  Lab 09/25/17 1843 09/26/17 0559 09/27/17 0158  NA 141  --  140  K 3.8  --  4.1  CL 103  --  106  CO2 24  --  22  GLUCOSE 92  --  104*  BUN 13  --  12  CREATININE 0.94 0.86 0.93  CALCIUM 9.7  --  9.0   GFR: Estimated Creatinine Clearance: 49.2 mL/min (by C-G formula based on SCr of 0.93 mg/dL). Liver Function Tests: No results for input(s): AST, ALT, ALKPHOS,  BILITOT, PROT, ALBUMIN in the last 168 hours. No results for input(s): LIPASE, AMYLASE in the last 168 hours. No results for input(s): AMMONIA in the last 168 hours. Coagulation Profile: Recent Labs  Lab 09/27/17 1132  INR 0.92   Cardiac Enzymes: No results for input(s): CKTOTAL, CKMB, CKMBINDEX, TROPONINI in the last 168 hours. BNP (last 3 results) No results for input(s): PROBNP in the last 8760 hours. HbA1C: No results for input(s): HGBA1C in the last 72 hours. CBG: No results for input(s): GLUCAP in the last 168 hours. Lipid Profile: No results for input(s): CHOL, HDL, LDLCALC, TRIG, CHOLHDL, LDLDIRECT in the last 72 hours. Thyroid Function Tests: No results for input(s): TSH, T4TOTAL, FREET4, T3FREE, THYROIDAB in the last 72 hours. Anemia Panel: No results for input(s): VITAMINB12, FOLATE, FERRITIN, TIBC, IRON, RETICCTPCT in the last 72 hours. Sepsis Labs: No results for input(s): PROCALCITON, LATICACIDVEN in the last 168 hours.  Recent Results (from the past 240 hour(s))  Surgical PCR screen     Status: None   Collection Time: 09/26/17  6:35 PM  Result Value Ref Range Status   MRSA, PCR NEGATIVE NEGATIVE Final   Staphylococcus aureus NEGATIVE NEGATIVE Final    Comment: (NOTE) The Xpert SA Assay (FDA approved for NASAL specimens in patients 42 years of age and older), is one component of a comprehensive surveillance program. It is not intended to diagnose infection nor to guide or monitor treatment. Performed at [facility], [place] [address] [place], [place] [address]          Radiology Studies: No results found.      Scheduled Meds: . amLODipine  10 mg Oral QHS  . diclofenac sodium  4 g Topical QID  . losartan  100 mg Oral Daily  . metoprolol tartrate  50 mg Oral BID  . mometasone-formoterol  2 puff Inhalation Q12H  . pantoprazole  40 mg Oral Daily  . polyethylene glycol  17 g Oral BID  . pravastatin  40 mg Oral q1800  . senna-docusate  2  tablet Oral BID  . tamsulosin  0.4 mg Oral QPC supper   Continuous Infusions:   LOS: 2 days     Georgette Shell, MD [facility] If 7PM-7AM, please contact night-coverage [email or web address] Password [facility] 09/29/2017, 10:58 AM

## 2017-09-29 NOTE — Procedures (Signed)
S/P T 12 balloon KP with biopsy

## 2017-09-29 NOTE — Progress Notes (Signed)
Patient off floor to IR for Kyphoplasty.

## 2017-09-29 NOTE — Progress Notes (Signed)
PT Cancellation Note  Patient Details Name: [name] [name] MRN: [id number] DOB: [birth year]   Cancelled Treatment:    Reason Eval/Treat Not Completed: Active bedrest order.  Pt on bedrest for 3 hours post kyphoplasty.  Thanks,   Barbarann Ehlers. [place], [place], DPT [phone]   09/29/2017, 3:47 PM

## 2017-09-30 MED ORDER — BISACODYL 5 MG PO TBEC: 10.0000 mg | DELAYED_RELEASE_TABLET | Freq: Once | ORAL | Status: DC

## 2017-09-30 MED ORDER — BISACODYL 10 MG RE SUPP
10.0000 mg | Freq: Once | RECTAL | Status: AC
  Administered 2017-09-30: 10 mg via RECTAL
  Filled 2017-09-30: qty 1

## 2017-09-30 NOTE — Care Management Note (Signed)
Case Management Note  Patient Details  Name: [name] [name] MRN: [id number] Date of Birth: Oct 05, 1935  Subjective/Objective:    Pt admitted on 09/25/17 with back pain; found to have acute T12 compression fx.               Action/Plan: Case manager spoke with patient at bedside, and her husband via phone, concerning discharge plan and recommendation for [facility] therapies. Choice for Home Health agency was offered. Patient says she will only agree if there is no co-pay, she and her husband state they were told no copay in the past and received a large bill afterwards. Patient asked that referral be called to [place] [facility] Case manager contacted [facility], [facility] and asked her to check out patient's copay. Per [name], Patient will have a $10.00 per visit copay. CM contacted patient with this information and she declined Home Health services.    Expected Discharge Date:  09/30/17               Expected Discharge Plan:  Home/Self Care  In-House Referral:  NA  Discharge planning Services  CM Consult  Post Acute Care Choice:  Home Health, NA Choice offered to:  Patient, Spouse  DME Arranged:  (has all DME) DME Agency:  NA  HH Arranged:  Patient Refused [facility] Agency:  NA  Status of Service:  Completed, signed off  If discussed at [place] of Stay Meetings, dates discussed:    Additional Comments:  Ninfa Meeker, RN 09/30/2017, 11:55 AM

## 2017-09-30 NOTE — Progress Notes (Signed)
Physical Therapy Treatment Patient Details Name: [name] [name] MRN: [id number] DOB: 19-Aug-1935 Today's Date: 09/30/2017    History of Present Illness [name] [name] is an 82 y.o. female with PMH of CAD status post stent, hypertension, bronchitis, and chronic back pain status post kyphoplasty who was admitted 09/25/2017 for evaluation of acute worsening of her chronic lower back pain associated with increased swelling of her feet.  Found to have acute T12 compression fracture s/p kyphoplasty on 09/29/17.      PT Comments    PT re-eval preformed and pt, despite reported pain, was able to get up and mobilize with RW down the hallway with very minimal assist.  Pt's husband is very mobile and can provide appropriate level of assist at discharge.  Pt's goal from previous assessment remain appropriate.  PT to follow acutely until d/c confirmed.      Follow Up Recommendations  Home health PT     Equipment Recommendations  None recommended by PT    Recommendations for Other Services   NA     Precautions / Restrictions Precautions Precautions: Fall;Back Precaution Comments: reviewed back precautions with pt and her husband Required Braces or Orthoses: Spinal Brace Spinal Brace: Lumbar corset;Applied in sitting position(for comfort from home)    Mobility  Bed Mobility Overal bed mobility: Needs Assistance Bed Mobility: Rolling;Sidelying to Sit Rolling: Min guard Sidelying to sit: Min assist       General bed mobility comments: Verbal cues for log roll, pt able to roll to side without much difficulty, but needed min assist to support trunk to get to sitting EOB.   Transfers Overall transfer level: Needs assistance Equipment used: Rolling walker (2 wheeled) Transfers: Sit to/from Stand Sit to Stand: Min guard         General transfer comment: Min guard assist for safety during transitions.    Ambulation/Gait Ambulation/Gait assistance: Min guard Ambulation Distance (Feet): 65  Feet Assistive device: Rolling walker (2 wheeled) Gait Pattern/deviations: Step-through pattern;Trunk flexed     General Gait Details: Min guard assist for safety, verbal cues for safe proximity to RW and upright posture.  Brace donned EOB.           Balance Overall balance assessment: Needs assistance Sitting-balance support: Feet supported;No upper extremity supported Sitting balance-Leahy Scale: Good     Standing balance support: Bilateral upper extremity supported Standing balance-Leahy Scale: Fair                              Cognition Arousal/Alertness: Awake/alert Behavior During Therapy: WFL for tasks assessed/performed Overall Cognitive Status: History of cognitive impairments - at baseline                                 General Comments: STM deficits             Pertinent Vitals/Pain Pain Assessment: Faces Faces Pain Scale: Hurts whole lot Pain Location: back and left hip Pain Descriptors / Indicators: Grimacing;Guarding;Moaning Pain Intervention(s): Monitored during session;Limited activity within patient's tolerance;Repositioned           PT Goals (current goals can now be found in the care plan section) Acute Rehab PT Goals Patient Stated Goal: to go home, reduce pain PT Goal Formulation: With patient Time For Goal Achievement: 10/07/17 Potential to Achieve Goals: Good Progress towards PT goals: Progressing toward goals    Frequency  Min 5X/week      PT Plan Current plan remains appropriate;Frequency needs to be updated       AM-PAC PT "6 Clicks" Daily Activity  Outcome Measure  Difficulty turning over in bed (including adjusting bedclothes, sheets and blankets)?: A Little Difficulty moving from lying on back to sitting on the side of the bed? : A Lot Difficulty sitting down on and standing up from a chair with arms (e.g., wheelchair, bedside commode, etc,.)?: A Little Help needed moving to and from a bed  to chair (including a wheelchair)?: A Little Help needed walking in hospital room?: A Little Help needed climbing 3-5 steps with a railing? : A Little 6 Click Score: 17    End of Session Equipment Utilized During Treatment: Back brace Activity Tolerance: Patient limited by pain Patient left: in bed;with call bell/phone within reach;with family/visitor present(seated EOB) Nurse Communication: Mobility status PT Visit Diagnosis: Other abnormalities of gait and mobility (R26.89);Difficulty in walking, not elsewhere classified (R26.2)     Time: 4166-0630 PT Time Calculation (min) (ACUTE ONLY): 27 min  Charges:  $Therapeutic Activity: 8-22 mins, 1 re-eval          Elidia Bonenfant B. Starr, [place], DPT [phone]            09/30/2017, 2:51 PM

## 2017-09-30 NOTE — Discharge Summary (Signed)
Physician Discharge Summary  [name] [name] BHA:[id number] DOB: 02-16-1936 DOA: 09/25/2017  PCP: [name], MD  Admit date: 09/25/2017 Discharge date: 09/30/2017  Admitted From:home Disposition: home Recommendations for Outpatient Follow-up:  1. Follow up with PCP in 1-2 weeks 2. Please obtain BMP/CBC in one week  Home Health yes Equipment/Devices:none Discharge Condition stable CODE STATUS full Diet recommendation cardiac Brief/Interim Summary: 82 y.o. female with PMH of CAD status post stent, hypertension, bronchitis, and chronic back pain status post kyphoplasty who was admitted 09/25/2017 for evaluation of acute worsening of her chronic lower back pain associated with increased swelling of her feet.  Initial work-up in the ED included plain films of the lumbar spine which were unremarkable.  Admitted for pain management and further work-up.     Discharge Diagnoses:  Principal Problem:   T12 compression fracture ([place]) Active Problems:   Essential hypertension   Osteoporosis   Coronary artery disease due to lipid rich plaque   Depression, major, in partial remission (HCC)   GERD    Lumbar radiculopathy, acute   Low back pain   CKD (chronic kidney disease), stage III (HCC)  Low back pain, acute on chronic/acute lumbar radiculopathy secondary to T12 compression fracture patient with known osteoporosis Plain films negative for acute findings.  MRI showed an acute T12 compression fracture.  Patient had kyphoplasty and biopsy done 09/29/2017.  She will be discharged home today.  She reports she does have a brace at home.  She will follow-up with her pain management doctor for further pain management.  She did report that she has enough Percocet at home. Active Problems:   Essential hypertension Continue Norvasc, metoprolol and Cozaar.  Blood pressure controlled.    Coronary artery disease Continue beta-blocker and statin.    GERD Continue Protonix.     Hyperlipidemia Continue Pravachol.    Stage III CKD-stable    Discharge Instructions  Discharge Instructions    Call MD for:  difficulty breathing, headache or visual disturbances   Complete by:  As directed    Call MD for:  persistant dizziness or light-headedness   Complete by:  As directed    Call MD for:  persistant nausea and vomiting   Complete by:  As directed    Call MD for:  severe uncontrolled pain   Complete by:  As directed    Diet - low sodium heart healthy   Complete by:  As directed    Increase activity slowly   Complete by:  As directed      Allergies as of 09/30/2017      Reactions   Ace Inhibitors Cough   Per Dr Melvyn Novas pulmonology 2013   Aspartame Diarrhea, Nausea And Vomiting   Atorvastatin Other (See Comments)   Muscle aches   Biaxin [clarithromycin] Other (See Comments)   PATIENT CAN TOLERATE Z-PAK.  Is written on patient's paper chart.   Daliresp [roflumilast] Nausea And Vomiting   Erythromycin Other (See Comments)   PATIENT CAN TOLERATE Z-PAK.  It is written on patient's paper chart.   Levofloxacin Nausea And Vomiting      Medication List    STOP taking these medications   acetaminophen 325 MG tablet Commonly known as:  TYLENOL   potassium chloride SA 20 MEQ tablet Commonly known as:  K-DUR,KLOR-CON   risedronate 150 MG tablet Commonly known as:  ACTONEL     TAKE these medications   albuterol 108 (90 Base) MCG/ACT inhaler Commonly known as:  PROAIR HFA Inhale 2 puffs  into the lungs every 6 (six) hours as needed for wheezing or shortness of breath.   ALPRAZolam 0.5 MG tablet Commonly known as:  XANAX Take 1/2 to 1 tablet 2 to 3 x / day ONLY if Anxiety Attack and please try to limit to 5 days /week to avoid addiction   amLODipine 10 MG tablet Commonly known as:  NORVASC TAKE 1 TABLET BY MOUTH AT BEDTIME What changed:  Another medication with the same name was removed. Continue taking this medication, and follow the directions you see  here.   diclofenac sodium 1 % Gel Commonly known as:  VOLTAREN Apply 4 g topically 4 (four) times daily.   dicyclomine 10 MG capsule Commonly known as:  BENTYL TAKE 1 CAPSULE BY MOUTH EVERY 8 HOURS AS NEEDED FOR SPASM   gabapentin 300 MG capsule Commonly known as:  NEURONTIN Take 300 mg by mouth 3 (three) times daily.   ipratropium-albuterol 0.5-2.5 (3) MG/3ML Soln Commonly known as:  DUONEB USE ONE VIAL IN NEBULIZER 4 TIMES DAILY What changed:    how much to take  how to take this  when to take this  reasons to take this  additional instructions   ketoconazole 2 % cream Commonly known as:  NIZORAL APPLY 1 APPLICATION TOPICALLY TWO TIMES DAILY   losartan 100 MG tablet Commonly known as:  COZAAR TAKE ONE TABLET BY MOUTH ONCE DAILY (DISCONTINUE  BENICAR)   meclizine 25 MG tablet Commonly known as:  ANTIVERT 1/2-1 pill up to 3 times daily for motion sickness/dizziness   metoprolol tartrate 50 MG tablet Commonly known as:  LOPRESSOR TAKE ONE TABLET BY MOUTH TWICE DAILY FOR  BLOOD  PRESSURE What changed:  See the new instructions.   mometasone-formoterol 100-5 MCG/ACT Aero Commonly known as:  DULERA Inhale 2 puffs into the lungs every 12 (twelve) hours.   neomycin-polymyxin b-dexamethasone 3.5-10000-0.1 Oint Commonly known as:  MAXITROL APPLY TO STYE FOUR TIMES DAILY.   nitroGLYCERIN 0.4 MG SL tablet Commonly known as:  NITROSTAT Place 1 tablet (0.4 mg total) under the tongue every 5 (five) minutes as needed for chest pain.   OVER THE COUNTER MEDICATION Take 1,000 mg by mouth 2 (two) times daily. [place] Calcium 1000mg    oxyCODONE-acetaminophen 5-325 MG tablet Commonly known as:  PERCOCET/ROXICET Take 1 tablet by mouth every 4 (four) hours as needed for severe pain.   pantoprazole 40 MG tablet Commonly known as:  PROTONIX Take 1 tablet (40 mg total) daily by mouth.   polyethylene glycol powder powder Commonly known as:  GLYCOLAX/MIRALAX Take 17 g by  mouth 2 (two) times daily.   pravastatin 40 MG tablet Commonly known as:  PRAVACHOL Take 1 tablet (40 mg total) by mouth daily.   promethazine 25 MG tablet Commonly known as:  PHENERGAN Take 1 tablet (25 mg total) by mouth every 6 (six) hours as needed for nausea or vomiting.   senna-docusate 8.6-50 MG tablet Commonly known as:  Senokot-S Take 2 tablets by mouth 2 (two) times daily.   silodosin 8 MG Caps capsule Commonly known as:  RAPAFLO Take 8 mg by mouth daily with breakfast.   tamsulosin 0.4 MG Caps capsule Commonly known as:  FLOMAX Take 0.4 mg by mouth daily.   Vitamin D3 2000 units Tabs Take 10,000 Units by mouth daily.       Allergies  Allergen Reactions  . Ace Inhibitors Cough    Per Dr Melvyn Novas pulmonology 2013  . Aspartame Diarrhea and Nausea And Vomiting  .  Atorvastatin Other (See Comments)    Muscle aches  . Biaxin [Clarithromycin] Other (See Comments)    PATIENT CAN TOLERATE Z-PAK.  Is written on patient's paper chart.  [name] [Roflumilast] Nausea And Vomiting  . Erythromycin Other (See Comments)    PATIENT CAN TOLERATE Z-PAK.  It is written on patient's paper chart.  . Levofloxacin Nausea And Vomiting    Consultations: ir  Procedures/Studies: Dg Chest 2 View  Result Date: 09/26/2017 CLINICAL DATA:  Leukocytosis EXAM: CHEST - 2 VIEW COMPARISON:  08/09/2017 FINDINGS: Cardiac shadow is mildly enlarged but stable. Aortic calcifications are again seen. The lungs are well aerated bilaterally. No focal infiltrate or sizable effusion is seen. No acute bony abnormality is noted. IMPRESSION: No active cardiopulmonary disease. Electronically Signed   By: Inez Catalina M.D.   On: 09/26/2017 08:34   Dg Lumbar Spine Complete  Result Date: 09/25/2017 CLINICAL DATA:  Back pain and leg swelling EXAM: LUMBAR SPINE - COMPLETE 4+ VIEW COMPARISON:  Lumbar spine radiograph 04/27/2017 . FINDINGS: There is posterior spinal fusion at L1-L3. Vertebral augmentation changes at  L2-L4. No new compression fracture. Grade 1 L3-4 retrolisthesis and L4-5 anterolisthesis is unchanged IMPRESSION: Changes of prior lumbar fusion and multilevel vertebral augmentation without acute abnormality. Electronically Signed   By: Ulyses Jarred M.D.   On: 09/25/2017 21:19   Mr Lumbar Spine Wo Contrast  Result Date: 09/26/2017 CLINICAL DATA:  Back pain with risk factors for fracture. EXAM: MRI LUMBAR SPINE WITHOUT CONTRAST TECHNIQUE: Multiplanar, multisequence MR imaging of the lumbar spine was performed. No intravenous contrast was administered. COMPARISON:  Radiography from yesterday.  Abdominal CT 04/05/2017 FINDINGS: Segmentation:  5 lumbar vertebrae Alignment: Grade 1 anterolisthesis at L4-5, facet mediated. Compression fractures cause a reversal of lordosis at the upper lumbar spine. Vertebrae: T12 compression fracture with marrow edema about a band of trabecular impaction. Based on prior CT there is a pre-existing superior endplate fracture with mild depression. There is no retropulsion or posterior element involvement. Prior L1, L2, L3, and L4 body fractures with cement augmentation at L2-L4. Posterior fusion hardware from L1-L3. Conus medullaris and cauda equina: Conus extends to the T12-L1 level. Conus and cauda equina appear normal. Paraspinal and other soft tissues: Dilated common bile duct, chronic and likely from reservoir effect after cholecystectomy. There is atrophy of intrinsic back muscles. Disc levels: T12- L1: Intervertebral ankylosis from bridging osteophyte. No impingement L1-L2: Posterior-lateral fusion. Facet hypertrophy. No visible impingement L2-L3: Advanced disc narrowing. Posterior element hypertrophy. Limited assessment of the foramina due to artifact. Mild spinal stenosis. L3-L4: Degenerative facet arthropathy narrowing the foramina and causing mild to moderate spinal stenosis. Narrow disc without herniation L4-L5: Advanced facet arthropathy with hypertrophy. The disc is  bulging. Noncompressive canal and bilateral foraminal stenosis L5-S1:Laminectomy. Advanced disc narrowing with mild facet spurring. No compressive stenosis. IMPRESSION: 1. Acute T12 compression fracture. No depression when accounting for a remote superior endplate fracture. No retropulsion. 2. L1-2 intervertebral ankylosis. 3. Remote L1-L4 vertebral fracture. 4. Diffuse degenerative changes that are described above. No high-grade canal stenosis. Electronically Signed   By: Monte Fantasia M.D.   On: 09/26/2017 08:41    (Echo, Carotid, EGD, Colonoscopy, ERCP)    Subjective:   Discharge Exam: Vitals:   09/30/17 0324 09/30/17 0400  BP:  130/60  Pulse:    Resp:    Temp: 98.3 F (36.8 C)   SpO2:  94%   Vitals:   09/29/17 2258 09/29/17 2339 09/30/17 0324 09/30/17 0400  BP:  [name])  127/50  130/60  Pulse:      Resp:      Temp: 98.4 F (36.9 C)  98.3 F (36.8 C)   TempSrc: Oral  Oral   SpO2:  96%  94%  Weight:      Height:        General: Pt is alert, awake, not in acute distress Cardiovascular: RRR, S1/S2 +, no rubs, no gallops Respiratory: CTA bilaterally, no wheezing, no rhonchi Abdominal: Soft, NT, ND, bowel sounds + Extremities: no edema, no cyanosis    The results of significant diagnostics from this hospitalization (including imaging, microbiology, ancillary and laboratory) are listed below for reference.     Microbiology: Recent Results (from the past 240 hour(s))  Surgical PCR screen     Status: None   Collection Time: 09/26/17  6:35 PM  Result Value Ref Range Status   MRSA, PCR NEGATIVE NEGATIVE Final   Staphylococcus aureus NEGATIVE NEGATIVE Final    Comment: (NOTE) The Xpert SA Assay (FDA approved for NASAL specimens in patients 64 years of age and older), is one component of a comprehensive surveillance program. It is not intended to diagnose infection nor to guide or monitor treatment. Performed at [facility], [place] [address], [place],   [address]      Labs: BNP (last 3 results) Recent Labs    09/25/17 1843  BNP 258.5*   Basic Metabolic Panel: Recent Labs  Lab 09/25/17 1843 09/26/17 0559 09/27/17 0158  NA 141  --  140  K 3.8  --  4.1  CL 103  --  106  CO2 24  --  22  GLUCOSE 92  --  104*  BUN 13  --  12  CREATININE 0.94 0.86 0.93  CALCIUM 9.7  --  9.0   Liver Function Tests: No results for input(s): AST, ALT, ALKPHOS, BILITOT, PROT, ALBUMIN in the last 168 hours. No results for input(s): LIPASE, AMYLASE in the last 168 hours. No results for input(s): AMMONIA in the last 168 hours. CBC: Recent Labs  Lab 09/25/17 1843 09/26/17 0559 09/27/17 0158  WBC 11.8* 7.7 8.4  NEUTROABS 8.2*  --   --   HGB 15.2* 13.5 13.9  HCT 46.4* 41.2 42.9  MCV 95.9 97.2 97.1  PLT 285 251 262   Cardiac Enzymes: No results for input(s): CKTOTAL, CKMB, CKMBINDEX, TROPONINI in the last 168 hours. BNP: Invalid input(s): POCBNP CBG: No results for input(s): GLUCAP in the last 168 hours. D-Dimer No results for input(s): DDIMER in the last 72 hours. Hgb A1c No results for input(s): HGBA1C in the last 72 hours. Lipid Profile No results for input(s): CHOL, HDL, LDLCALC, TRIG, CHOLHDL, LDLDIRECT in the last 72 hours. Thyroid function studies No results for input(s): TSH, T4TOTAL, T3FREE, THYROIDAB in the last 72 hours.  Invalid input(s): FREET3 Anemia work up No results for input(s): VITAMINB12, FOLATE, FERRITIN, TIBC, IRON, RETICCTPCT in the last 72 hours. Urinalysis    Component Value Date/Time   COLORURINE YELLOW 09/26/2017 0417   APPEARANCEUR CLOUDY (A) 09/26/2017 0417   LABSPEC 1.018 09/26/2017 0417   PHURINE 8.0 09/26/2017 0417   GLUCOSEU NEGATIVE 09/26/2017 0417   HGBUR NEGATIVE 09/26/2017 0417   BILIRUBINUR NEGATIVE 09/26/2017 0417   KETONESUR 5 (A) 09/26/2017 0417   PROTEINUR NEGATIVE 09/26/2017 0417   UROBILINOGEN 0.2 09/19/2014 2235   NITRITE NEGATIVE 09/26/2017 0417   LEUKOCYTESUR NEGATIVE 09/26/2017  0417   Sepsis Labs Invalid input(s): PROCALCITONIN,  WBC,  LACTICIDVEN Microbiology Recent Results (  from the past 240 hour(s))  Surgical PCR screen     Status: None   Collection Time: 09/26/17  6:35 PM  Result Value Ref Range Status   MRSA, PCR NEGATIVE NEGATIVE Final   Staphylococcus aureus NEGATIVE NEGATIVE Final    Comment: (NOTE) The Xpert SA Assay (FDA approved for NASAL specimens in patients 16 years of age and older), is one component of a comprehensive surveillance program. It is not intended to diagnose infection nor to guide or monitor treatment. Performed at [facility], [place] [address] [place], [place] [address]      Time coordinating discharge: 33 minutes  SIGNED:   Georgette Shell, MD  [facility] 09/30/2017, 7:10 AM Pager   If 7PM-7AM, please contact night-coverage [email or web address] Password [email or web address]

## 2017-09-30 NOTE — Progress Notes (Signed)
Pt d/c instructions given and reviewed with pt and husband; all questions answered. Iv's removed. Pt with all belongings at time of d/c.

## 2017-10-03 ENCOUNTER — Telehealth (HOSPITAL_COMMUNITY): Payer: Self-pay

## 2017-10-03 ENCOUNTER — Encounter (HOSPITAL_COMMUNITY): Payer: Self-pay | Admitting: Interventional Radiology

## 2017-10-03 DIAGNOSIS — Z79891 Long term (current) use of opiate analgesic: Secondary | ICD-10-CM | POA: Diagnosis not present

## 2017-10-03 DIAGNOSIS — M4726 Other spondylosis with radiculopathy, lumbar region: Secondary | ICD-10-CM | POA: Diagnosis not present

## 2017-10-03 DIAGNOSIS — M961 Postlaminectomy syndrome, not elsewhere classified: Secondary | ICD-10-CM | POA: Diagnosis not present

## 2017-10-03 DIAGNOSIS — G894 Chronic pain syndrome: Secondary | ICD-10-CM | POA: Diagnosis not present

## 2017-10-03 NOTE — Telephone Encounter (Signed)
Pt is feeling better since T12 KP, she will call if things change. AW

## 2017-10-05 ENCOUNTER — Other Ambulatory Visit: Payer: Self-pay | Admitting: Internal Medicine

## 2017-10-05 ENCOUNTER — Ambulatory Visit: Payer: PPO | Admitting: Gastroenterology

## 2017-10-06 ENCOUNTER — Other Ambulatory Visit: Payer: Self-pay | Admitting: Internal Medicine

## 2017-10-06 DIAGNOSIS — G894 Chronic pain syndrome: Secondary | ICD-10-CM

## 2017-10-10 ENCOUNTER — Ambulatory Visit: Payer: PPO | Admitting: Rheumatology

## 2017-10-10 ENCOUNTER — Emergency Department (HOSPITAL_COMMUNITY): Payer: PPO

## 2017-10-10 ENCOUNTER — Encounter (HOSPITAL_COMMUNITY): Payer: Self-pay | Admitting: Emergency Medicine

## 2017-10-10 ENCOUNTER — Emergency Department (HOSPITAL_COMMUNITY)
Admission: EM | Admit: 2017-10-10 | Discharge: 2017-10-10 | Disposition: A | Discharge status: Home / Self Care | Payer: PPO | Attending: Emergency Medicine | Admitting: Emergency Medicine

## 2017-10-10 ENCOUNTER — Other Ambulatory Visit: Payer: Self-pay

## 2017-10-10 DIAGNOSIS — S22089A Unspecified fracture of T11-T12 vertebra, initial encounter for closed fracture: Secondary | ICD-10-CM | POA: Insufficient documentation

## 2017-10-10 DIAGNOSIS — N183 Chronic kidney disease, stage 3 (moderate): Secondary | ICD-10-CM | POA: Insufficient documentation

## 2017-10-10 DIAGNOSIS — I129 Hypertensive chronic kidney disease with stage 1 through stage 4 chronic kidney disease, or unspecified chronic kidney disease: Secondary | ICD-10-CM | POA: Insufficient documentation

## 2017-10-10 DIAGNOSIS — R079 Chest pain, unspecified: Secondary | ICD-10-CM | POA: Diagnosis not present

## 2017-10-10 DIAGNOSIS — Y9389 Activity, other specified: Secondary | ICD-10-CM | POA: Insufficient documentation

## 2017-10-10 DIAGNOSIS — R109 Unspecified abdominal pain: Secondary | ICD-10-CM | POA: Insufficient documentation

## 2017-10-10 DIAGNOSIS — Y9289 Other specified places as the place of occurrence of the external cause: Secondary | ICD-10-CM | POA: Diagnosis not present

## 2017-10-10 DIAGNOSIS — S32040A Wedge compression fracture of fourth lumbar vertebra, initial encounter for closed fracture: Secondary | ICD-10-CM | POA: Diagnosis not present

## 2017-10-10 DIAGNOSIS — M5489 Other dorsalgia: Secondary | ICD-10-CM | POA: Diagnosis present

## 2017-10-10 DIAGNOSIS — Z79899 Other long term (current) drug therapy: Secondary | ICD-10-CM | POA: Diagnosis not present

## 2017-10-10 DIAGNOSIS — R110 Nausea: Secondary | ICD-10-CM | POA: Insufficient documentation

## 2017-10-10 DIAGNOSIS — M4854XA Collapsed vertebra, not elsewhere classified, thoracic region, initial encounter for fracture: Secondary | ICD-10-CM | POA: Diagnosis not present

## 2017-10-10 DIAGNOSIS — R600 Localized edema: Secondary | ICD-10-CM | POA: Diagnosis not present

## 2017-10-10 DIAGNOSIS — S32010A Wedge compression fracture of first lumbar vertebra, initial encounter for closed fracture: Secondary | ICD-10-CM | POA: Diagnosis not present

## 2017-10-10 DIAGNOSIS — Y999 Unspecified external cause status: Secondary | ICD-10-CM | POA: Insufficient documentation

## 2017-10-10 DIAGNOSIS — M6289 Other specified disorders of muscle: Secondary | ICD-10-CM | POA: Diagnosis not present

## 2017-10-10 DIAGNOSIS — S22088A Other fracture of T11-T12 vertebra, initial encounter for closed fracture: Secondary | ICD-10-CM

## 2017-10-10 DIAGNOSIS — X58XXXA Exposure to other specified factors, initial encounter: Secondary | ICD-10-CM | POA: Insufficient documentation

## 2017-10-10 LAB — CBC WITH DIFFERENTIAL/PLATELET
ABS IMMATURE GRANULOCYTES: 0.1 10*3/uL (ref 0.0–0.1)
BASOS ABS: 0.1 10*3/uL (ref 0.0–0.1)
BASOS PCT: 1 %
Eosinophils Absolute: 0.5 10*3/uL (ref 0.0–0.7)
Eosinophils Relative: 5 %
HCT: 46.5 % — ABNORMAL HIGH (ref 36.0–46.0)
Hemoglobin: 15.3 g/dL — ABNORMAL HIGH (ref 12.0–15.0)
IMMATURE GRANULOCYTES: 1 %
Lymphocytes Relative: 20 %
Lymphs Abs: 1.9 10*3/uL (ref 0.7–4.0)
MCH: 31.4 pg (ref 26.0–34.0)
MCHC: 32.9 g/dL (ref 30.0–36.0)
MCV: 95.3 fL (ref 78.0–100.0)
MONOS PCT: 8 %
Monocytes Absolute: 0.8 10*3/uL (ref 0.1–1.0)
NEUTROS ABS: 6.3 10*3/uL (ref 1.7–7.7)
NEUTROS PCT: 65 %
PLATELETS: 475 10*3/uL — AB (ref 150–400)
RBC: 4.88 MIL/uL (ref 3.87–5.11)
RDW: 12.8 % (ref 11.5–15.5)
WBC: 9.6 10*3/uL (ref 4.0–10.5)

## 2017-10-10 LAB — URINALYSIS, ROUTINE W REFLEX MICROSCOPIC
BILIRUBIN URINE: NEGATIVE
Glucose, UA: NEGATIVE mg/dL
Hgb urine dipstick: NEGATIVE
KETONES UR: NEGATIVE mg/dL
LEUKOCYTES UA: NEGATIVE
NITRITE: NEGATIVE
PH: 9 — AB (ref 5.0–8.0)
PROTEIN: NEGATIVE mg/dL
Specific Gravity, Urine: 1.011 (ref 1.005–1.030)

## 2017-10-10 LAB — COMPREHENSIVE METABOLIC PANEL
ALK PHOS: 116 U/L (ref 38–126)
ALT: 33 U/L (ref 14–54)
ANION GAP: 11 (ref 5–15)
AST: 21 U/L (ref 15–41)
Albumin: 4 g/dL (ref 3.5–5.0)
BUN: 13 mg/dL (ref 6–20)
CALCIUM: 9.7 mg/dL (ref 8.9–10.3)
CO2: 27 mmol/L (ref 22–32)
CREATININE: 0.9 mg/dL (ref 0.44–1.00)
Chloride: 102 mmol/L (ref 101–111)
GFR, EST NON AFRICAN AMERICAN: 59 mL/min — AB (ref >60)
Glucose, Bld: 130 mg/dL — ABNORMAL HIGH (ref 65–99)
Potassium: 4.1 mmol/L (ref 3.5–5.1)
SODIUM: 140 mmol/L (ref 135–145)
TOTAL PROTEIN: 7.7 g/dL (ref 6.5–8.1)
Total Bilirubin: 0.6 mg/dL (ref 0.3–1.2)

## 2017-10-10 LAB — I-STAT CG4 LACTIC ACID, ED: Lactic Acid, Venous: 1.67 mmol/L (ref 0.5–1.9)

## 2017-10-10 MED ORDER — ONDANSETRON HCL 4 MG/2ML IJ SOLN
4.0000 mg | Freq: Once | INTRAMUSCULAR | Status: AC
  Administered 2017-10-10: 4 mg via INTRAVENOUS
  Filled 2017-10-10: qty 2

## 2017-10-10 MED ORDER — PROMETHAZINE HCL 25 MG PO TABS
12.5000 mg | ORAL_TABLET | Freq: Once | ORAL | Status: AC
  Administered 2017-10-10: 12.5 mg via ORAL
  Filled 2017-10-10: qty 1

## 2017-10-10 MED ORDER — HYDROMORPHONE HCL 2 MG/ML IJ SOLN
1.0000 mg | Freq: Once | INTRAMUSCULAR | Status: AC
  Administered 2017-10-10: 1 mg via INTRAVENOUS
  Filled 2017-10-10: qty 1

## 2017-10-10 MED ORDER — SODIUM CHLORIDE 0.9 % IV BOLUS
500.0000 mL | Freq: Once | INTRAVENOUS | Status: AC
  Administered 2017-10-10: 500 mL via INTRAVENOUS

## 2017-10-10 MED ORDER — OXYCODONE-ACETAMINOPHEN 5-325 MG PO TABS
2.0000 | ORAL_TABLET | Freq: Once | ORAL | Status: AC
  Administered 2017-10-10: 2 via ORAL
  Filled 2017-10-10: qty 2

## 2017-10-10 MED ORDER — METOCLOPRAMIDE HCL 5 MG/ML IJ SOLN
10.0000 mg | Freq: Once | INTRAMUSCULAR | Status: AC
  Administered 2017-10-10: 10 mg via INTRAVENOUS
  Filled 2017-10-10: qty 2

## 2017-10-10 MED ORDER — LORAZEPAM 2 MG/ML IJ SOLN
1.0000 mg | Freq: Once | INTRAMUSCULAR | Status: AC | PRN
  Administered 2017-10-10: 1 mg via INTRAVENOUS
  Filled 2017-10-10: qty 1

## 2017-10-10 MED ORDER — IOPAMIDOL (ISOVUE-370) INJECTION 76%: 100.0000 mL | Freq: Once | INTRAVENOUS | Status: DC

## 2017-10-10 MED ORDER — IOPAMIDOL (ISOVUE-370) INJECTION 76%
100.0000 mL | Freq: Once | INTRAVENOUS | Status: AC | PRN
  Administered 2017-10-10: 100 mL via INTRAVENOUS

## 2017-10-10 MED ORDER — ONDANSETRON 4 MG PO TBDP
4.0000 mg | ORAL_TABLET | Freq: Once | ORAL | Status: AC | PRN
  Administered 2017-10-10: 4 mg via ORAL
  Filled 2017-10-10: qty 1

## 2017-10-10 MED ORDER — IOPAMIDOL (ISOVUE-370) INJECTION 76%
INTRAVENOUS | Status: AC
  Filled 2017-10-10: qty 100

## 2017-10-10 MED ORDER — OXYCODONE HCL 5 MG PO TABS
5.0000 mg | ORAL_TABLET | ORAL | Status: DC | PRN
Start: 2017-10-10 — End: 2017-10-11
  Administered 2017-10-10: 5 mg via ORAL
  Filled 2017-10-10: qty 1

## 2017-10-10 MED ORDER — LIDOCAINE 5 % EX PTCH
1.0000 | MEDICATED_PATCH | CUTANEOUS | Status: DC
  Administered 2017-10-10: 1 via TRANSDERMAL
  Filled 2017-10-10: qty 1

## 2017-10-10 NOTE — ED Triage Notes (Signed)
Patient presents to the ED reports had back surgery 1.5 week ago. Patient reports nausea and pain. Patient denies any pain at the site. Patient alert and oriented. Patient reports right leg pain. States which is not normal for her.

## 2017-10-10 NOTE — ED Provider Notes (Signed)
[facility] EMERGENCY DEPARTMENT Provider Note   CSN: [id number] Arrival date & time: 10/10/17  3818      History   Chief Complaint Chief Complaint  Patient presents with  . Post-op Problem    HPI [name] [name] is a 82 y.o. female.  The history is provided by the patient and a relative. No language interpreter was used.    [name] [name] is a 82 y.o. female who presents to the Emergency Department complaining of back pain. She reports severe midline and right-sided back pain that radiates down her right lower extremity that has been present for the last 3 to 4 days. Eleven days ago she was admitted to the hospital for back pain and had kyphoplasty and biopsy performed on May 24. She was admitted for pain in her back and following the procedure her pain had resolved for several days. A few days ago the pain began to return and became very severe yesterday. Pain is constant in nature and worse with movement. She has associated nausea with dry heaving since yesterday. She denies any fevers, numbness, weakness. She does endorse mild abdominal discomfort. She does have chronic pain and is on Percocet five, four times daily. No recent changes to her medication dosing. Past Medical History:  Diagnosis Date  . Anxiety    takes Xanax daily as needed  . Asthma    Albuterol daily as needed  . Blood transfusion without reported diagnosis   . CAD (coronary artery disease)    LHC 2003 with 40% pLAD, 30% mLAD, 100% D1 (moderate vessel), 100%  D2 (small vessel), 95% mid small OM2.  Pt had PTCA to D1;  D2 and OM2 small vessels and tx medically;  Last Myoview (2012): anterior infarct seen with scar, no ischemia. EF normal. Pt managed medically;  Lexiscan Myoview (12/14):  Low risk; ant scar with very mild peri-infarct ischemia; EF 55% with ant AK   . Cataract   . Chronic back pain    HNP, DDD, spinal stenosis, vertebral compression fractures.   . Chronic nausea    takes Zofran  daily as needed.  normal gastric emptying study 03/2013  . CKD (chronic kidney disease), stage III ([place]) 09/27/2017  . Constipation    felt to be functional. takes Senokot and Miralax daily.  treated with Linzess in past.   . DIVERTICULOSIS, COLON 08/28/2008   Qualifier: Diagnosis of  By: [name], [name], [name]    . Elevated LFTs 2015   probably med induced DILI, from Augmentin vs Pravastatin  . GERD (gastroesophageal reflux disease)    hx of esophageal stricture   . Hiatal hernia    Paraesophageal hernias. s/p fundoplications in 2993 and 04/2013  . History of bronchitis several of yrs ago  . History of colon polyps 03/2009, 03/2011   adenomatous colon polyps, no high grade dysplasia.   [name] History of vertigo    no meds  . Hyperlipidemia    takes Pravastatin daily  . Hypertension    takes Amlodipine,Metoprolol,and Losartan daily  . Nausea 03/2016  . Osteoporosis   . PONV (postoperative nausea and vomiting)    yrs ago  . Slow urinary stream    takes Rapaflo daily  . TIA (transient ischemic attack) 1995   no residual effects noted    Patient Active Problem List   Diagnosis Date Noted  . CKD (chronic kidney disease), stage III ([place]) 09/27/2017  . Low back pain 09/26/2017  . T12 compression fracture ([place]) 09/26/2017  .  Chronic bronchitis ([place]) 08/09/2017  . Atherosclerosis of aorta ([place]) 04/05/2017  . Costochondritis 11/14/2016  . Anxiety state 06/08/2016  . History of lumbar spinal fusion 04/13/2016  . Spondylolisthesis, lumbar region 04/13/2016  . Leukocytosis 03/28/2016  . Lumbar radiculopathy, acute 12/07/2015    Class: Chronic  . Spinal stenosis, lumbar region, with neurogenic claudication 05/29/2015  . DDD (degenerative disc disease), lumbar 04/21/2015  . Mixed hyperlipidemia 11/24/2014  . GERD  11/24/2014  . Depression, major, in partial remission ([place]) 09/26/2014  . Chronic pain syndrome 09/26/2014  . Coronary artery disease due to lipid rich plaque   . Osteoporosis  02/10/2014  . Prediabetes 08/14/2013  . Vitamin D deficiency 08/14/2013  . Medication management 08/14/2013  . Constipation, chronic 04/03/2013  . MGUS (monoclonal gammopathy of unknown significance) 03/05/2013  . Herniated lumbar intervertebral disc 06/25/2012    Class: Acute  . Vertebral compression fracture ([place]) 06/23/2012  . ESOPHAGEAL STRICTURE 08/28/2008  . Mild intermittent asthma without complication 78/29/5621  . Incarcerated recurrent paraesophageal hiatal hernia s/p lap redo repair [id number] 01/31/2008  . Essential hypertension 07/25/2007    Past Surgical History:  Procedure Laterality Date  . APPENDECTOMY     patient unsure of date  . BACK SURGERY    . BLADDER REPAIR    . CHOLECYSTECTOMY     Patient unsure of date.  . CORONARY ANGIOPLASTY  11/16/2001   1 stent  . ESOPHAGEAL MANOMETRY N/A 03/25/2013   Procedure: ESOPHAGEAL MANOMETRY (EM);  Surgeon: Inda Castle, MD;  Location: [facility] ENDOSCOPY;  Service: Endoscopy;  Laterality: N/A;  . ESOPHAGOGASTRODUODENOSCOPY N/A 03/15/2013   Procedure: ESOPHAGOGASTRODUODENOSCOPY (EGD);  Surgeon: Jerene Bears, MD;  Location: [facility] ENDOSCOPY;  Service: Endoscopy;  Laterality: N/A;  . ESOPHAGOGASTRODUODENOSCOPY N/A 12/25/2015   Procedure: ESOPHAGOGASTRODUODENOSCOPY (EGD);  Surgeon: Doran Stabler, MD;  Location: [facility] ENDOSCOPY;  Service: Endoscopy;  Laterality: N/A;  . EYE SURGERY  11/2000   bilateral cataracts with lens implant  . FIXATION KYPHOPLASTY LUMBAR SPINE  X 2   "L3-4"  . HEMORRHOID SURGERY    . HIATAL HERNIA REPAIR  06/03/2011   Procedure: LAPAROSCOPIC REPAIR OF HIATAL HERNIA;  Surgeon: Adin Hector, MD;  Location: [facility] ORS;  Service: General;  Laterality: N/A;  . HIATAL HERNIA REPAIR N/A 04/24/2013   Procedure: LAPAROSCOPIC  REPAIR RECURRENT PARASOPHAGEAL HIATAL HERNIA WITH FUNDOPLICATION;  Surgeon: Adin Hector, MD;  Location: [facility] ORS;  Service: General;  Laterality: N/A;  . INSERTION OF MESH N/A 04/24/2013   Procedure:  INSERTION OF MESH;  Surgeon: Adin Hector, MD;  Location: [facility] ORS;  Service: General;  Laterality: N/A;  . IR KYPHO THORACIC WITH BONE BIOPSY  09/29/2017  . LAPAROSCOPIC LYSIS OF ADHESIONS N/A 04/24/2013   Procedure: LAPAROSCOPIC LYSIS OF ADHESIONS;  Surgeon: Adin Hector, MD;  Location: [facility] ORS;  Service: General;  Laterality: N/A;  . LAPAROSCOPIC NISSEN FUNDOPLICATION  4/69/6295   Procedure: LAPAROSCOPIC NISSEN FUNDOPLICATION;  Surgeon: Adin Hector, MD;  Location: [facility] ORS;  Service: General;  Laterality: N/A;  . LUMBAR FUSION  12/07/2015   Right L2-3 facetectomy, posterolateral fusion L2-3 with pedicle screws, rods, local bone graft, Vivigen cancellous chips. Fusion extended to the L1 level with pedicle screws and rods  . LUMBAR LAMINECTOMY/DECOMPRESSION MICRODISCECTOMY Right 06/26/2012   Procedure: LUMBAR LAMINECTOMY/DECOMPRESSION MICRODISCECTOMY;  Surgeon: Jessy Oto, MD;  Location: [facility];  Service: Orthopedics;  Laterality: Right;  RIGHT L4-5 MICRODISCECTOMY  . LUMBAR LAMINECTOMY/DECOMPRESSION MICRODISCECTOMY N/A 05/29/2015   Procedure: RIGHT L2-3 MICRODISCECTOMY;  Surgeon: Jessy Oto, MD;  Location: [facility];  Service: Orthopedics;  Laterality: N/A;  . TOTAL ABDOMINAL HYSTERECTOMY  1975   partial     OB History   None      Home Medications    Prior to Admission medications   Medication Sig Start Date End Date Taking? Authorizing Provider  acetaminophen (TYLENOL) 500 MG tablet Take 1,000 mg by mouth every 6 (six) hours as needed for mild pain.   Yes [provider]  ALPRAZolam Duanne Moron) 0.5 MG tablet Take 1/2 to 1 tablet 2 to 3 x / day ONLY if Anxiety Attack and please try to limit to 5 days /week to avoid addiction 08/14/17  Yes [name], MD  amLODipine (NORVASC) 10 MG tablet TAKE 1 TABLET BY MOUTH AT BEDTIME 09/08/17  Yes [name], MD  Cholecalciferol (VITAMIN D3) 5000 units CAPS Take 10,000 Units by mouth daily.   Yes [provider]    diclofenac sodium (VOLTAREN) 1 % GEL Apply 4 g topically 4 (four) times daily. Patient taking differently: Apply 4 g topically 4 (four) times daily as needed (pain).  12/06/16  Yes Jessy Oto, MD  dicyclomine (BENTYL) 10 MG capsule TAKE 1 CAPSULE BY MOUTH EVERY 8 HOURS AS NEEDED FOR SPASM 08/04/17  Yes Armbruster, Carlota Raspberry, MD  dicyclomine (BENTYL) 10 MG capsule Take 10 mg by mouth daily as needed for spasms.   Yes [provider]  ketoconazole (NIZORAL) 2 % cream APPLY 1 APPLICATION TOPICALLY TWO TIMES DAILY 02/10/17  Yes Vicie Mutters, PA-C  losartan (COZAAR) 100 MG tablet TAKE ONE TABLET BY MOUTH ONCE DAILY (DISCONTINUE  BENICAR) 06/22/17  Yes [name], MD  meclizine (ANTIVERT) 25 MG tablet 1/2-1 pill up to 3 times daily for motion sickness/dizziness 06/08/16  Yes Vicie Mutters, PA-C  metoprolol tartrate (LOPRESSOR) 50 MG tablet TAKE ONE TABLET BY MOUTH TWICE DAILY FOR  BLOOD  PRESSURE Patient taking differently: TAKE ONE TABLET BY MOUTH IN THE EVENING FOR BLOOD  PRESSURE 04/02/17  Yes Vicie Mutters, PA-C  ondansetron (ZOFRAN-ODT) 8 MG disintegrating tablet DISSOLVE ONE TABLET IN MOUTH EVERY 8 HOURS AS NEEDED FOR NAUSEA AND  VOMITING 10/05/17  Yes [name], NP  OVER THE COUNTER MEDICATION Take 1,000 mg by mouth 2 (two) times daily. [place] Calcium 1000mg    Yes [provider]  oxyCODONE-acetaminophen (PERCOCET/ROXICET) 5-325 MG tablet Take 1 tablet by mouth every 4 (four) hours as needed for severe pain. 03/10/17  Yes Tegeler, Gwenyth Allegra, MD  polyethylene glycol powder (GLYCOLAX/MIRALAX) powder Take 17 g by mouth 2 (two) times daily. 05/16/17  Yes Armbruster, Carlota Raspberry, MD  pravastatin (PRAVACHOL) 40 MG tablet Take 1 tablet (40 mg total) by mouth daily. 07/31/17  Yes [name], MD  promethazine (PHENERGAN) 25 MG tablet Take 1 tablet (25 mg total) by mouth every 6 (six) hours as needed for nausea or vomiting. 09/04/17  Yes [name], MD  senna-docusate  (SENOKOT-S) 8.6-50 MG tablet Take 2 tablets by mouth 2 (two) times daily. 12/26/15  Yes Emokpae, Courage, MD  silodosin (RAPAFLO) 8 MG CAPS capsule Take 8 mg by mouth daily with breakfast.    Yes [provider]  albuterol (PROAIR HFA) 108 (90 Base) MCG/ACT inhaler Inhale 2 puffs into the lungs every 6 (six) hours as needed for wheezing or shortness of breath. 09/22/16   Vicie Mutters, PA-C  gabapentin (NEURONTIN) 300 MG capsule TAKE 1 CAPSULE BY MOUTH THREE TIMES DAILY FOR  CHRONIC  PAIN. Patient not  taking: Reported on 10/10/2017 10/06/17   [name], MD  ipratropium-albuterol (DUONEB) 0.5-2.5 (3) MG/3ML SOLN USE ONE VIAL IN NEBULIZER 4 TIMES DAILY Patient taking differently: Take 3 mLs by nebulization every 6 (six) hours as needed (wheezing). USE ONE VIAL IN NEBULIZER 4 TIMES DAILY as needed 08/09/17   [name], NP  mometasone-formoterol (DULERA) 100-5 MCG/ACT AERO Inhale 2 puffs into the lungs every 12 (twelve) hours. 09/08/17   [name], MD  neomycin-polymyxin b-dexamethasone (MAXITROL) 3.5-10000-0.1 OINT APPLY TO STYE FOUR TIMES DAILY. 07/07/17   Vicie Mutters, PA-C  nitroGLYCERIN (NITROSTAT) 0.4 MG SL tablet Place 1 tablet (0.4 mg total) under the tongue every 5 (five) minutes as needed for chest pain. 09/04/14   Larey Dresser, MD  pantoprazole (PROTONIX) 40 MG tablet Take 1 tablet (40 mg total) daily by mouth. Patient not taking: Reported on 10/10/2017 03/23/17   Vicie Mutters, PA-C    Family History Family History  Problem Relation Age of Onset  . Colon cancer Mother 50  . Heart disease Father 71       heart attack  . Brain cancer Brother        tumor   . Heart disease Brother   . Heart disease Brother   . Kidney disease Daughter     Social History Social History   Tobacco Use  . Smoking status: Never Smoker  . Smokeless tobacco: Never Used  Substance Use Topics  . Alcohol use: No  . Drug use: No     Allergies   Ace inhibitors; Aspartame;  Atorvastatin; Biaxin [clarithromycin]; Daliresp [roflumilast]; Erythromycin; and Levofloxacin   Review of Systems Review of Systems  All other systems reviewed and are negative.    Physical Exam Updated Vital Signs BP (!) 159/84   Pulse 76   Temp 98.2 F (36.8 C) (Oral)   Resp 18   SpO2 99%   Physical Exam  Constitutional: She is oriented to person, place, and time. She appears well-developed and well-nourished.  Uncomfortable appearing, rocking on stretcher  HENT:  Head: Normocephalic and atraumatic.  Cardiovascular: Normal rate and regular rhythm.  No murmur heard. Pulmonary/Chest: Effort normal and breath sounds normal. No respiratory distress.  Abdominal: Soft. There is no tenderness. There is no rebound and no guarding.  Musculoskeletal: She exhibits no tenderness.  Post surgical site on right mid back with no local tenderness, erythema or edema. No tenderness to palpation throughout the entire thoracic, lumbar spine. Trace pitting edema to bilateral lower extremities. 2+ DP pulses bilaterally.  Neurological: She is alert and oriented to person, place, and time.  Five out of five strength to bilateral lower extremities with sensation to light touch intact and bilateral lower extremities  Skin: Skin is warm and dry.  Psychiatric: She has a normal mood and affect. Her behavior is normal.  Nursing note and vitals reviewed.    ED Treatments / Results  Labs (all labs ordered are listed, but only abnormal results are displayed) Labs Reviewed  COMPREHENSIVE METABOLIC PANEL - Abnormal; Notable for the following components:      Result Value   Glucose, Bld 130 (*)    GFR calc non Af Amer 59 (*)    All other components within normal limits  URINALYSIS, ROUTINE W REFLEX MICROSCOPIC - Abnormal; Notable for the following components:   APPearance CLOUDY (*)    pH 9.0 (*)    All other components within normal limits  CBC WITH DIFFERENTIAL/PLATELET - Abnormal; Notable for the  following  components:   Hemoglobin 15.3 (*)    HCT 46.5 (*)    Platelets 475 (*)    All other components within normal limits  I-STAT CG4 LACTIC ACID, ED    EKG EKG Interpretation  Date/Time:  Tuesday October 10 2017 09:40:52 EDT Ventricular Rate:  80 PR Interval:    QRS Duration: 101 QT Interval:  390 QTC Calculation: 450 R Axis:   9 Text Interpretation:  Sinus rhythm Low voltage, precordial leads Consider anterior infarct Baseline wander in lead(s) V4 Confirmed by Quintella Reichert [phone]) on 10/10/2017 9:55:30 AM   Radiology Ct Angio Chest/abd/pel For Dissection W And/or W/wo  Result Date: 10/10/2017 CLINICAL DATA:  Chest and abdominal/back pain EXAM: CT ANGIOGRAPHY CHEST, ABDOMEN AND PELVIS TECHNIQUE: Initially, axial CT images were obtained through the chest without intravenous contrast material administration. Multidetector CT imaging through the chest, abdomen and pelvis was performed using the standard protocol during bolus administration of intravenous contrast. Multiplanar reconstructed images and MIPs were obtained and reviewed to evaluate the vascular anatomy. CONTRAST:  154mL ISOVUE-370 IOPAMIDOL (ISOVUE-370) INJECTION 76%, <See Chart> ISOVUE-370 IOPAMIDOL (ISOVUE-370) INJECTION 76% COMPARISON:  CT angiogram abdomen and pelvis April 05, 2017 FINDINGS: CTA CHEST FINDINGS Cardiovascular: On the noncontrast images of the chest, there is no evident intramural hematoma. There is no appreciable thoracic aortic aneurysm or dissection. Visualized great vessels appear somewhat tortuous with do not show appreciable obstructive disease. A few small foci of calcification in the visualized great vessels are noted. There is atherosclerotic calcification in the aorta. There are foci of coronary artery calcification. No pulmonary embolus is demonstrable. There is no appreciable pericardial effusion or pericardial thickening evident. Mediastinum/Nodes: Visualized thyroid appears unremarkable. There is  no appreciable thoracic adenopathy. There is a focal hiatal hernia. There are postoperative changes suggesting previous Nissen fundoplication procedure. Lungs/Pleura: There is slight bibasilar atelectasis. No lung edema or consolidation. No pleural effusion or pleural thickening evident. Musculoskeletal: The patient has had previous kyphoplasty procedure at T12. Bones appear osteoporotic. No blastic or lytic bone lesions are evident. No chest wall lesions evident. Review of the MIP images confirms the above findings. CTA ABDOMEN AND PELVIS FINDINGS VASCULAR Aorta: No abdominal aortic aneurysm or dissection evident. There are foci of aortic atherosclerosis throughout the aorta, similar to prior study. Celiac: Atherosclerotic calcification is noted at the origin of the celiac axis without hemodynamically significant obstruction. Celiac artery and its major branches appear patent. No aneurysm or dissection. SMA: Superior mesenteric artery and its branches appear widely patent. No aneurysm or dissection. Replaced right hepatic artery arising from the right proximal superior mesenteric artery. Renals: There is a single renal artery on the left. There is atherosclerotic calcification at the origin of the left renal artery with less than 50% diameter obstruction in this area. There is a main right renal artery with accessory right renal artery with both vessels patent. No aneurysm or dissection involving renal arteries. No fibromuscular dysplasia evident. No vasculitis evident. IMA: Inferior mesenteric artery and its branches appear patent. No aneurysm or dissection. Inflow: There are scattered foci of calcification in each mid the distal common iliac artery. There is calcification in both internal iliac arteries without hemodynamically significant obstruction. There is mild calcification in each distal common femoral artery. No aneurysm or dissection involving major pelvic arterial vessels. Veins: No obvious venous  abnormality within the limitations of this arterial phase study. Review of the MIP images confirms the above findings. NON-VASCULAR Hepatobiliary: There is a stable 1 cm cyst in the anterior  segment right lobe of the liver near the falciform ligament. No other focal liver lesions are evident. The gallbladder is absent. There is no appreciable intrahepatic biliary duct dilatation. The common bile duct measures 12 mm without biliary duct mass or calculus. Pancreas: No pancreatic mass or inflammatory focus. Spleen: No splenic lesions are evident. Adrenals/Urinary Tract: Adrenals appear unremarkable bilaterally. Kidneys bilaterally show no evident mass or hydronephrosis on either side. There is no evident renal or ureteral calculus on either side. Urinary bladder is midline with wall thickness within normal limits. Stomach/Bowel: There is a focal hiatal hernia with evidence of previous Nissen fundoplication. There is no appreciable bowel wall or mesenteric thickening. No evident bowel obstruction. There are scattered colonic diverticula, most notably in the sigmoid region, without diverticulitis. Lymphatic: No demonstrable adenopathy in the abdomen or pelvis. Reproductive: Uterus absent.  No evident pelvic mass. Other: Appendix absent. No abscess or ascites evident in the abdomen or pelvis. There is a rather minimal ventral hernia containing only fat. Musculoskeletal: There is postoperative change at multiple levels in the lumbar region. The patient has undergone previous kyphoplasty procedures at L2, L3, L4. There is anterior wedging at T12, L1, L2, L3, and L4. There are no blastic or lytic bone lesions. There is degenerative change in each hip joint. Bones are osteoporotic. There is no intramuscular or abdominal wall lesion evident. Review of the MIP images confirms the above findings. IMPRESSION: CT angiogram chest: 1. No thoracic aortic aneurysm or dissection. No evident pulmonary embolus. There is thoracic aortic  atherosclerosis. There are foci of coronary artery calcification. 2.  No edema or consolidation.  Slight bibasilar lung atelectasis. 3.  No demonstrable thoracic adenopathy. 4. Focal hiatal hernia with evidence of previous Nissen fundoplication procedure. 5.  Prior kyphoplasty at T12. CT angiogram abdomen; CT angiogram pelvis: 1. Stable areas of atherosclerosis in the aorta and major mesenteric and pelvic arterial vessels without hemodynamically significant obstruction. No aneurysm or dissection in the abdominal aorta or major pelvic/mesenteric arterial vessels. 2. No bowel obstruction. No abscess. Appendix absent. There are colonic diverticula without diverticulitis. 3. Gallbladder absent. Prominence of the common bile duct without mass or calculus evident. 4. Prior for Nissen fundoplication procedure with hiatal hernia. Minimal ventral hernia containing only fat. 5. Multilevel postoperative change in kyphoplasty procedures noted in the lumbar region. Multiple wedge compression fractures in the lumbar spine, a finding noted previously. 6.  No renal or ureteral calculus.  No hydronephrosis. 7.  Uterus absent. Aortic Atherosclerosis (ICD10-I70.0). Electronically Signed   By: Lowella Grip III M.D.   On: 10/10/2017 11:27    Procedures Procedures (including critical care time)  Medications Ordered in ED Medications  iopamidol (ISOVUE-370) 76 % injection 100 mL (has no administration in time range)  lidocaine (LIDODERM) 5 % 1 patch (1 patch Transdermal Patch Applied 10/10/17 1229)  LORazepam (ATIVAN) injection 1 mg (has no administration in time range)  promethazine (PHENERGAN) tablet 12.5 mg (has no administration in time range)  oxyCODONE (Oxy IR/ROXICODONE) immediate release tablet 5 mg (has no administration in time range)  ondansetron (ZOFRAN-ODT) disintegrating tablet 4 mg (4 mg Oral Given 10/10/17 0640)  HYDROmorphone (DILAUDID) injection 1 mg (1 mg Intravenous Given 10/10/17 0812)  ondansetron  (ZOFRAN) injection 4 mg (4 mg Intravenous Given 10/10/17 0813)  sodium chloride 0.9 % bolus 500 mL (0 mLs Intravenous Stopped 10/10/17 0951)  ondansetron (ZOFRAN) injection 4 mg (4 mg Intravenous Given 10/10/17 0923)  metoCLOPramide (REGLAN) injection 10 mg (10 mg Intravenous Given 10/10/17 0950)  iopamidol (ISOVUE-370) 76 % injection 100 mL (100 mLs Intravenous Contrast Given 10/10/17 1058)  oxyCODONE-acetaminophen (PERCOCET/ROXICET) 5-325 MG per tablet 2 tablet (2 tablets Oral Given 10/10/17 1230)  promethazine (PHENERGAN) tablet 12.5 mg (12.5 mg Oral Given 10/10/17 1335)     Initial Impression / Assessment and Plan / ED Course  I have reviewed the triage vital signs and the nursing notes.  Pertinent labs & imaging results that were available during my care of the patient were reviewed by me and considered in my medical decision making (see chart for details).     Patient with history of chronic pain as well as lumbar surgery and thoracic-plasty here for evaluation of recurrent and worsening pain with associated nausea. She is neurovascular intact on examination. Given patient's initial presentation with writhing discomfort concern for possible dissection versus ischemia and CT was obtained. CT is negative for acute dissection. Given recurrent pain in setting of recent thoracic compression fracture that occurred without trauma plan to obtain MRI lumbar and sacral spine to evaluate for recurrent fracture. Patient care transferred pending MRI.  Final Clinical Impressions(s) / ED Diagnoses   Final diagnoses:  None    ED Discharge Orders    None       Quintella Reichert, MD 10/10/17 1611

## 2017-10-10 NOTE — ED Notes (Signed)
Pt back from MRI 

## 2017-10-10 NOTE — ED Notes (Signed)
Pt states " my nausea is getting better "

## 2017-10-10 NOTE — ED Notes (Signed)
Pt going to MRI at this time.

## 2017-10-10 NOTE — ED Notes (Signed)
Patient transported to CT 

## 2017-10-11 NOTE — ED Provider Notes (Signed)
I received this patient in signout from Dr. Ralene Bathe.  We were awaiting MRI results.  MRI showed small area of edema at T11 endplate suggestive of acute fracture with no loss of height.  Also some mild intramuscular edema in the paraspinous musculature suggesting muscular strain.  I discussed these findings with the patient and her husband and need for outpatient follow-up.  Recommended continuing current pain Ac[place] regimen at home.  Extensively reviewed return precautions.  They voiced understanding.   Little, Wenda Overland, MD 10/11/17 0130

## 2017-10-13 ENCOUNTER — Ambulatory Visit: Payer: Self-pay | Admitting: Internal Medicine

## 2017-10-13 ENCOUNTER — Other Ambulatory Visit: Payer: Self-pay | Admitting: *Deleted

## 2017-10-13 NOTE — Patient Outreach (Signed)
[facility]) Care Management  10/13/2017  ALEIYAH HALPIN [birth year] [id number]   Telephone Screen  Referral Date: 10/13/17 Referral Source: UM Referral  Referral Reason: med, co pay assistance Insurance:HTA  Outreach attempt # 1 [facility] RN CM left HIPAA compliant message along with contact info with the female who stated he was her husband, [name]  Plan: [facility] RN CM sent an unsuccessful outreach letter and scheduled this patient for another call attempt within 3 business days  [employer]. Lavina Hamman, RN, BSN, CCM [facility] Telephonic Care Management Care Coordinator Direct number [phone]  Main [facility] number [phone] Fax number [phone]

## 2017-10-16 ENCOUNTER — Ambulatory Visit (INDEPENDENT_AMBULATORY_CARE_PROVIDER_SITE_OTHER): Payer: PPO | Admitting: Internal Medicine

## 2017-10-16 ENCOUNTER — Encounter: Payer: Self-pay | Admitting: Internal Medicine

## 2017-10-16 ENCOUNTER — Other Ambulatory Visit: Payer: Self-pay | Admitting: *Deleted

## 2017-10-16 VITALS — BP 122/68 | HR 72 | Temp 97.3°F | Resp 16 | Ht 63.0 in | Wt 161.8 lb

## 2017-10-16 DIAGNOSIS — M5416 Radiculopathy, lumbar region: Secondary | ICD-10-CM

## 2017-10-16 DIAGNOSIS — I2510 Atherosclerotic heart disease of native coronary artery without angina pectoris: Secondary | ICD-10-CM | POA: Diagnosis not present

## 2017-10-16 DIAGNOSIS — N183 Chronic kidney disease, stage 3 unspecified: Secondary | ICD-10-CM

## 2017-10-16 DIAGNOSIS — E782 Mixed hyperlipidemia: Secondary | ICD-10-CM

## 2017-10-16 DIAGNOSIS — I10 Essential (primary) hypertension: Secondary | ICD-10-CM | POA: Diagnosis not present

## 2017-10-16 DIAGNOSIS — S22080A Wedge compression fracture of T11-T12 vertebra, initial encounter for closed fracture: Secondary | ICD-10-CM

## 2017-10-16 DIAGNOSIS — M545 Low back pain: Secondary | ICD-10-CM | POA: Diagnosis not present

## 2017-10-16 DIAGNOSIS — I2583 Coronary atherosclerosis due to lipid rich plaque: Secondary | ICD-10-CM | POA: Diagnosis not present

## 2017-10-16 DIAGNOSIS — K219 Gastro-esophageal reflux disease without esophagitis: Secondary | ICD-10-CM

## 2017-10-16 NOTE — Patient Outreach (Signed)
[facility]) Care Management  10/16/2017  [name] [name] 10/23/1935 [id number]   Telephone Screen  Referral Date: 10/13/17 Referral Source: UM Referral  Referral Reason: med, co pay assistance Insurance:HTA   Patient is able to verify HIPAA [facility] RN CM spoke with [name] [name] and her husband, [name] [name]. HIPPA Reviewed and addressed referral to [facility] with patient She and her spouse reports inquired they would need to qualify for co pay assistance and medication assistance for the HTA plan    Appointments: They report they are planning to make a visit to see Dr Eddie North today and wish to discuss their concerns with him prior to returning a call to this CM. Social: [name] [name] lives with her husband  Conditions: HTN, CAS, asthma, GERD, chronic constipation, DDD, T12 compression fracture, low back pain, anxiety, major depression, vitamin D deficiency, HDL, spinal stenosis, CKD  Medications: [name] & [name] [name] reports changes in their medication cost from month to month  Plan: [place] CM will await a return call from [name] [name] and spouse. CM will discuss their concerns with other [facility] staff Cm will pend case for a return call to [name] [name] if needed within 4 business days to discuss concerns further prn    Kimberly L. Lavina Hamman, RN, BSN, CCM [facility] Telephonic Care Management Care Coordinator Direct number [phone]  Main [facility] number [phone] Fax number [phone]

## 2017-10-16 NOTE — Progress Notes (Signed)
[name] [name]     This very nice 82 y.o. MWF was admitted to the hospital on 25 Sep 2017 and patient was discharged from the hospital on  30 Sep 2017. The patient now presents for follow up for transition from recent hospitalization.  The day after discharge  our clinical staff contacted the patient to assure stability and schedule a follow up appointment. The discharge summary, medications and diagnostic test results were reviewed before meeting with the patient. The patient was admitted for:   Acute Low Back Pain Chronic /acute Lumbar Radiculopathy from T12 Compression fracture  Hypertension ASCAD GERD Hyperlipidemia CKD3    MRI showed an acute T12 compression fracture.  Patient had kyphoplasty and biopsy done 09/29/2017. Patient is followed by Dr Nicholaus Bloom for Chronic Pain Syndrome and has had back surg x 3 by Dr Louanne Skye & also a prior L3/L4 Kyphoplasty in 2014.      Hospitalization discharge instructions and medications are reconciled with the patient.      Patient is also followed with Hypertension, Hyperlipidemia, Pre-Diabetes and Vitamin D Deficiency.      Patient is treated for HTN  (1986) & BP has been controlled at home. Today's BP is at goal -  122/68. Patient had PCA/Stents in 2003 and had a negative Myoview. Patient has had no complaints of any cardiac type chest pain, palpitations, dyspnea/orthopnea/PND, dizziness, claudication, or dependent edema.     Hyperlipidemia is controlled with diet & meds. Patient denies myalgias or other med SE's. Last Lipids were at goal: Lab Results  Component Value Date   CHOL 169 07/25/2017   HDL 83 07/25/2017   LDLCALC 65 07/25/2017   TRIG 131 07/25/2017   CHOLHDL 2.0 07/25/2017      Also, the patient has history of PreDiabetes (A1c 6.1%/2013) and has had no symptoms of reactive hypoglycemia, diabetic polys, paresthesias or visual blurring.  Last A1c was almost to goal: Lab Results  Component Value Date   HGBA1C 5.7 (H)  07/25/2017      Further, the patient also has history of Vitamin D Deficiency ("25"/2008)  and supplements vitamin D without any suspected side-effects. Last vitamin D was still low & not at goal (70-100):   Lab Results  Component Value Date   VD25OH 48 07/25/2017   Current Outpatient Medications on File Prior to Visit  Medication Sig  . acetaminophen (TYLENOL) 500 MG tablet Take 1,000 mg by mouth every 6 (six) hours as needed for mild pain.  [name] albuterol (PROAIR HFA) 108 (90 Base) MCG/ACT inhaler Inhale 2 puffs into the lungs every 6 (six) hours as needed for wheezing or shortness of breath.  . ALPRAZolam (XANAX) 0.5 MG tablet Take 1/2 to 1 tablet 2 to 3 x / day ONLY if Anxiety Attack and please try to limit to 5 days /week to avoid addiction  . amLODipine (NORVASC) 10 MG tablet TAKE 1 TABLET BY MOUTH AT BEDTIME  . Cholecalciferol (VITAMIN D3) 5000 units CAPS Take 10,000 Units by mouth daily.  . diclofenac sodium (VOLTAREN) 1 % GEL Apply 4 g topically 4 (four) times daily. (Patient taking differently: Apply 4 g topically 4 (four) times daily as needed (pain). )  . dicyclomine (BENTYL) 10 MG capsule TAKE 1 CAPSULE BY MOUTH EVERY 8 HOURS AS NEEDED FOR SPASM  . gabapentin (NEURONTIN) 300 MG capsule TAKE 1 CAPSULE BY MOUTH THREE TIMES DAILY FOR  CHRONIC  PAIN.  [name] ipratropium-albuterol (DUONEB) 0.5-2.5 (3) MG/3ML SOLN USE ONE VIAL IN  NEBULIZER 4 TIMES DAILY (Patient taking differently: Take 3 mLs by nebulization every 6 (six) hours as needed (wheezing). USE ONE VIAL IN NEBULIZER 4 TIMES DAILY as needed)  . ketoconazole (NIZORAL) 2 % cream APPLY 1 APPLICATION TOPICALLY TWO TIMES DAILY  . losartan (COZAAR) 100 MG tablet TAKE ONE TABLET BY MOUTH ONCE DAILY (DISCONTINUE  BENICAR)  . meclizine (ANTIVERT) 25 MG tablet 1/2-1 pill up to 3 times daily for motion sickness/dizziness  . metoprolol tartrate (LOPRESSOR) 50 MG tablet TAKE ONE TABLET BY MOUTH TWICE DAILY FOR  BLOOD  PRESSURE (Patient taking  differently: TAKE ONE TABLET BY MOUTH IN THE EVENING FOR BLOOD  PRESSURE)  . mometasone-formoterol (DULERA) 100-5 MCG/ACT AERO Inhale 2 puffs into the lungs every 12 (twelve) hours.  [name] neomycin-polymyxin b-dexamethasone (MAXITROL) 3.5-10000-0.1 OINT APPLY TO STYE FOUR TIMES DAILY.  . nitroGLYCERIN (NITROSTAT) 0.4 MG SL tablet Place 1 tablet (0.4 mg total) under the tongue every 5 (five) minutes as needed for chest pain.  [name] ondansetron (ZOFRAN-ODT) 8 MG disintegrating tablet DISSOLVE ONE TABLET IN MOUTH EVERY 8 HOURS AS NEEDED FOR NAUSEA AND  VOMITING  . OVER THE COUNTER MEDICATION Take 1,000 mg by mouth 2 (two) times daily. [place] Calcium 1000mg   . oxyCODONE-acetaminophen (PERCOCET/ROXICET) 5-325 MG tablet Take 1 tablet by mouth every 4 (four) hours as needed for severe pain.  . pantoprazole (PROTONIX) 40 MG tablet Take 1 tablet (40 mg total) daily by mouth.  . polyethylene glycol powder (GLYCOLAX/MIRALAX) powder Take 17 g by mouth 2 (two) times daily.  . pravastatin (PRAVACHOL) 40 MG tablet Take 1 tablet (40 mg total) by mouth daily.  . promethazine (PHENERGAN) 25 MG tablet Take 1 tablet (25 mg total) by mouth every 6 (six) hours as needed for nausea or vomiting.  . senna-docusate (SENOKOT-S) 8.6-50 MG tablet Take 2 tablets by mouth 2 (two) times daily.  . silodosin (RAPAFLO) 8 MG CAPS capsule Take 8 mg by mouth daily with breakfast.    No current facility-administered medications on file prior to visit.    Allergies  Allergen Reactions  . Ace Inhibitors Cough    Per Dr Melvyn Novas pulmonology 2013  . Aspartame Diarrhea and Nausea And Vomiting  . Atorvastatin Other (See Comments)    Muscle aches  . Biaxin [Clarithromycin] Other (See Comments)    PATIENT CAN TOLERATE Z-PAK.  Is written on patient's paper chart.  [name] [Roflumilast] Nausea And Vomiting  . Erythromycin Other (See Comments)    PATIENT CAN TOLERATE Z-PAK.  It is written on patient's paper chart.  . Levofloxacin Nausea And  Vomiting   PMHx:   Past Medical History:  Diagnosis Date  . Anxiety    takes Xanax daily as needed  . Asthma    Albuterol daily as needed  . Blood transfusion without reported diagnosis   . CAD (coronary artery disease)    LHC 2003 with 40% pLAD, 30% mLAD, 100% D1 (moderate vessel), 100%  D2 (small vessel), 95% mid small OM2.  Pt had PTCA to D1;  D2 and OM2 small vessels and tx medically;  Last Myoview (2012): anterior infarct seen with scar, no ischemia. EF normal. Pt managed medically;  Lexiscan Myoview (12/14):  Low risk; ant scar with very mild peri-infarct ischemia; EF 55% with ant AK   . Cataract   . Chronic back pain    HNP, DDD, spinal stenosis, vertebral compression fractures.   . Chronic nausea    takes Zofran daily as needed.  normal gastric emptying study  03/2013  . CKD (chronic kidney disease), stage III ([place]) 09/27/2017  . Constipation    felt to be functional. takes Senokot and Miralax daily.  treated with Linzess in past.   . DIVERTICULOSIS, COLON 08/28/2008   Qualifier: Diagnosis of  By: [name], [name], [name]    . Elevated LFTs 2015   probably med induced DILI, from Augmentin vs Pravastatin  . GERD (gastroesophageal reflux disease)    hx of esophageal stricture   . Hiatal hernia    Paraesophageal hernias. s/p fundoplications in 8546 and 04/2013  . History of bronchitis several of yrs ago  . History of colon polyps 03/2009, 03/2011   adenomatous colon polyps, no high grade dysplasia.   [name] History of vertigo    no meds  . Hyperlipidemia    takes Pravastatin daily  . Hypertension    takes Amlodipine,Metoprolol,and Losartan daily  . Nausea 03/2016  . Osteoporosis   . PONV (postoperative nausea and vomiting)    yrs ago  . Slow urinary stream    takes Rapaflo daily  . TIA (transient ischemic attack) 1995   no residual effects noted   Immunization History  Administered Date(s) Administered  . DT 05/06/2014  . Influenza Split 02/07/2011  . Influenza Whole  02/09/2009, 01/19/2010, 02/13/2012  . Influenza, High Dose Seasonal PF 03/22/2013, 01/15/2014, 02/27/2015, 01/14/2016, 01/18/2017  . Pneumococcal Conjugate-13 05/06/2014  . Pneumococcal Polysaccharide-23 02/09/2011  . Pneumococcal-Unspecified 02/06/2010, 02/09/2012  . Td 05/10/2003   Past Surgical History:  Procedure Laterality Date  . APPENDECTOMY     patient unsure of date  . BACK SURGERY    . BLADDER REPAIR    . CHOLECYSTECTOMY     Patient unsure of date.  . CORONARY ANGIOPLASTY  11/16/2001   1 stent  . ESOPHAGEAL MANOMETRY N/A 03/25/2013   Procedure: ESOPHAGEAL MANOMETRY (EM);  Surgeon: Inda Castle, MD;  Location: [facility] ENDOSCOPY;  Service: Endoscopy;  Laterality: N/A;  . ESOPHAGOGASTRODUODENOSCOPY N/A 03/15/2013   Procedure: ESOPHAGOGASTRODUODENOSCOPY (EGD);  Surgeon: Jerene Bears, MD;  Location: [facility] ENDOSCOPY;  Service: Endoscopy;  Laterality: N/A;  . ESOPHAGOGASTRODUODENOSCOPY N/A 12/25/2015   Procedure: ESOPHAGOGASTRODUODENOSCOPY (EGD);  Surgeon: Doran Stabler, MD;  Location: [facility] ENDOSCOPY;  Service: Endoscopy;  Laterality: N/A;  . EYE SURGERY  11/2000   bilateral cataracts with lens implant  . FIXATION KYPHOPLASTY LUMBAR SPINE  X 2   "L3-4"  . HEMORRHOID SURGERY    . HIATAL HERNIA REPAIR  06/03/2011   Procedure: LAPAROSCOPIC REPAIR OF HIATAL HERNIA;  Surgeon: Adin Hector, MD;  Location: [facility] ORS;  Service: General;  Laterality: N/A;  . HIATAL HERNIA REPAIR N/A 04/24/2013   Procedure: LAPAROSCOPIC  REPAIR RECURRENT PARASOPHAGEAL HIATAL HERNIA WITH FUNDOPLICATION;  Surgeon: Adin Hector, MD;  Location: [facility];  Service: General;  Laterality: N/A;  . INSERTION OF MESH N/A 04/24/2013   Procedure: INSERTION OF MESH;  Surgeon: Adin Hector, MD;  Location: [facility] ORS;  Service: General;  Laterality: N/A;  . IR KYPHO THORACIC WITH BONE BIOPSY  09/29/2017  . LAPAROSCOPIC LYSIS OF ADHESIONS N/A 04/24/2013   Procedure: LAPAROSCOPIC LYSIS OF ADHESIONS;  Surgeon: Adin Hector,  MD;  Location: [facility];  Service: General;  Laterality: N/A;  . LAPAROSCOPIC NISSEN FUNDOPLICATION  2/70/3500   Procedure: LAPAROSCOPIC NISSEN FUNDOPLICATION;  Surgeon: Adin Hector, MD;  Location: [facility];  Service: General;  Laterality: N/A;  . LUMBAR FUSION  12/07/2015   Right L2-3 facetectomy, posterolateral fusion L2-3 with pedicle screws, rods, local  bone graft, Vivigen cancellous chips. Fusion extended to the L1 level with pedicle screws and rods  . LUMBAR LAMINECTOMY/DECOMPRESSION MICRODISCECTOMY Right 06/26/2012   Procedure: LUMBAR LAMINECTOMY/DECOMPRESSION MICRODISCECTOMY;  Surgeon: Jessy Oto, MD;  Location: [place];  Service: Orthopedics;  Laterality: Right;  RIGHT L4-5 MICRODISCECTOMY  . LUMBAR LAMINECTOMY/DECOMPRESSION MICRODISCECTOMY N/A 05/29/2015   Procedure: RIGHT L2-3 MICRODISCECTOMY;  Surgeon: Jessy Oto, MD;  Location: [place];  Service: Orthopedics;  Laterality: N/A;  . TOTAL ABDOMINAL HYSTERECTOMY  1975   partial   FHx:    Reviewed / unchanged  SHx:    Reviewed / unchanged  Systems Review:  Constitutional: Denies fever, chills, wt changes, headaches, insomnia, fatigue, night sweats, change in appetite. Eyes: Denies redness, blurred vision, diplopia, discharge, itchy, watery eyes.  ENT: Denies discharge, congestion, post nasal drip, epistaxis, sore throat, earache, hearing loss, dental pain, tinnitus, vertigo, sinus pain, snoring.  CV: Denies chest pain, palpitations, irregular heartbeat, syncope, dyspnea, diaphoresis, orthopnea, PND, claudication or edema. Respiratory: denies cough, dyspnea, DOE, pleurisy, hoarseness, laryngitis, wheezing.  Gastrointestinal: Denies dysphagia, odynophagia, heartburn, reflux, water brash, abdominal pain or cramps, nausea, vomiting, bloating, diarrhea, constipation, hematemesis, melena, hematochezia  or hemorrhoids. Genitourinary: Denies dysuria, frequency, urgency, nocturia, hesitancy, discharge, hematuria or flank  pain. Musculoskeletal: Denies arthralgias, myalgias, stiffness, jt. swelling, pain, limping or strain/sprain.  Skin: Denies pruritus, rash, hives, warts, acne, eczema or change in skin lesion(s). Neuro: No weakness, tremor, incoordination, spasms, paresthesia or pain. Psychiatric: Denies confusion, memory loss or sensory loss. Endo: Denies change in weight, skin or hair change.  Heme/Lymph: No excessive bleeding, bruising or enlarged lymph nodes.  Physical Exam  BP 122/68   Pulse 72   Temp (!) 97.3 F (36.3 C)   Resp 16   Ht 5\' 3"  (1.6 m)   Wt 161 lb 12.8 oz (73.4 kg)   BMI 28.66 kg/m   Appears well nourished, well groomed  and in no distress.  Eyes: PERRLA, EOMs, conjunctiva no swelling or erythema. Sinuses: No frontal/maxillary tenderness ENT/Mouth: EAC's clear, TM's nl w/o erythema, bulging. Nares clear w/o erythema, swelling, exudates. Oropharynx clear without erythema or exudates. Oral hygiene is good. Tongue normal, non obstructing. Hearing intact.  Neck: Supple. Thyroid nl. Car 2+/2+ without bruits, nodes or JVD. Chest: Respirations nl with BS clear & equal w/o rales, rhonchi, wheezing or stridor.  Cor: Heart sounds normal w/ regular rate and rhythm without sig. murmurs, gallops, clicks or rubs. Peripheral pulses normal and equal  without edema.  Abdomen: Soft & bowel sounds normal. Non-tender w/o guarding, rebound, hernias, masses or organomegaly.  Lymphatics: Unremarkable.  Musculoskeletal: Full ROM all peripheral extremities, joint stability, 5/5 strength and normal gait.  Skin: Warm, dry without exposed rashes, lesions or ecchymosis apparent.  Neuro: Cranial nerves intact, reflexes equal bilaterally. Sensory-motor testing grossly intact. Tendon reflexes grossly intact.  Pysch: Alert & oriented x 3.  Insight and judgement nl & appropriate. No ideations.  Assessment and Plan:   1. Acute low back pain  2. Lumbar radiculopathy, acute  3. T12 compression fracture  ([place])  4. Essential hypertension  - Continue medication, monitor blood pressure at home.  - Continue DASH diet. Reminder to go to the ER if any CP,  SOB, nausea, dizziness, severe HA, changes vision/speech.  5. Coronary artery disease  6. Gastroesophageal reflux disease  - Continue diet, exercise,& lifestyle modifications.   7. Hyperlipidemia, mixed  - Continue monitor periodic cholesterol/liver & renal functions   - Continue diet, exercise, lifestyle modifications.  -  Monitor appropriate labs.   8. CKD (chronic kidney disease) stage 3, GFR 30-59 ml/min (HCC)      Discussed  regular exercise, BP monitoring, weight control to achieve/maintain BMI less than 25 and discussed meds and SE's. Recommended labs to assess and monitor clinical status with further disposition pending results of labs. Over 30 minutes of exam, counseling, chart review was performed.

## 2017-10-17 ENCOUNTER — Other Ambulatory Visit: Payer: Self-pay | Admitting: *Deleted

## 2017-10-17 NOTE — Patient Outreach (Signed)
[facility]) Care Management  10/17/2017  [name] [name] 1935-08-12 [id number]   Telephone Screen  Referral Date:10/13/17 Referral Source:UM Referral Referral Reason:med, co pay assistance Insurance:HTA  care coordination/Outreach attempt # 3  [facility] RN CM received a voice message left by Mr [name] [name]  10/16/17 at 1735  He reports he spoke with the primary MD who did not have any knowledge about [facility] referral or need for patient to have assistance with medications and co pays.  Mr [name] [name] requests in the voice message that the referral be closed  On 10/16/17 Mrs and Mr [name] [name] had informed RN CM that they had inquired about what they would need to qualify for co pay assistance and medication assistance for the HTA plan    Appointments: They had an appointment with Dr Eddie North on 10/16/17 and discussed their concerns with him  Social: Mrs [name] [name] lives with her husband, the primary caregiver  Conditions: HTN, CAD, asthma, GERD, chronic constipation, DDD, T12 compression fracture, low back pain, anxiety, major depression, vitamin D deficiency, HDL, spinal stenosis, CKD  Medications: Mrs & Mrs [name] [name] reported changes in their medication cost from month to month  Plan: [name] [name] discussed their concerns with other [facility] pharmacy staff, K Ruedinger who request more information from patient and spouse about any particular medication concerns (generic or brand). Medication assistance depends on the individual medications. CM again confirmed no available program or assistance for office co pays Cm will pend case for a return call from Mr & Mrs [name] [name] within 8 business days to discuss concerns further prn (with an attempt to find a resolution and prepare for case closure if no response in 8 business days.   Issac [name] L. Lavina Hamman, RN, BSN, CCM [facility] Telephonic Care Management Care Coordinator Direct number [phone]  Main [facility] number [phone] Fax number [phone]

## 2017-10-18 ENCOUNTER — Ambulatory Visit: Payer: Self-pay | Admitting: *Deleted

## 2017-10-19 ENCOUNTER — Other Ambulatory Visit (HOSPITAL_COMMUNITY): Payer: Self-pay | Admitting: Interventional Radiology

## 2017-10-19 DIAGNOSIS — S22080A Wedge compression fracture of T11-T12 vertebra, initial encounter for closed fracture: Secondary | ICD-10-CM

## 2017-10-23 ENCOUNTER — Ambulatory Visit (HOSPITAL_COMMUNITY): Payer: PPO

## 2017-10-23 ENCOUNTER — Other Ambulatory Visit (HOSPITAL_COMMUNITY): Payer: Self-pay | Admitting: Interventional Radiology

## 2017-10-24 ENCOUNTER — Ambulatory Visit (INDEPENDENT_AMBULATORY_CARE_PROVIDER_SITE_OTHER): Payer: PPO | Admitting: Gastroenterology

## 2017-10-24 ENCOUNTER — Encounter: Payer: Self-pay | Admitting: Gastroenterology

## 2017-10-24 VITALS — BP 110/70 | HR 68 | Ht 62.25 in | Wt 163.0 lb

## 2017-10-24 DIAGNOSIS — R1310 Dysphagia, unspecified: Secondary | ICD-10-CM

## 2017-10-24 NOTE — Progress Notes (Signed)
10/24/2017 [name] [name] [id number] 05-19-1935   HISTORY OF PRESENT ILLNESS: This is a 82 year old female who is a patient of Dr. Doyne Keel.  She presents to the office today with complaints of dysphasia.  This is been a chronic issue and her last EGD in August 2017 showed a normal exam with her fundoplication wrap intact.  She is having complaints of dysphasia at that time.  She tells me that her dysphasia is intermittent to food, no problems swallowing liquids.  She is not currently on any PPI or H2 blockers and denies any symptoms of heartburn or reflux.  Of note, she recently has had some vertebral compression fractures and intervention for those and is currently in a back brace.   Past Medical History:  Diagnosis Date  . Anxiety    takes Xanax daily as needed  . Asthma    Albuterol daily as needed  . Blood transfusion without reported diagnosis   . CAD (coronary artery disease)    LHC 2003 with 40% pLAD, 30% mLAD, 100% D1 (moderate vessel), 100%  D2 (small vessel), 95% mid small OM2.  Pt had PTCA to D1;  D2 and OM2 small vessels and tx medically;  Last Myoview (2012): anterior infarct seen with scar, no ischemia. EF normal. Pt managed medically;  Lexiscan Myoview (12/14):  Low risk; ant scar with very mild peri-infarct ischemia; EF 55% with ant AK   . Cataract   . Chronic back pain    HNP, DDD, spinal stenosis, vertebral compression fractures.   . Chronic nausea    takes Zofran daily as needed.  normal gastric emptying study 03/2013  . CKD (chronic kidney disease), stage III ([place]) 09/27/2017  . Constipation    felt to be functional. takes Senokot and Miralax daily.  treated with Linzess in past.   . DIVERTICULOSIS, COLON 08/28/2008   Qualifier: Diagnosis of  By: [name], [name], [name]    . Elevated LFTs 2015   probably med induced DILI, from Augmentin vs Pravastatin  . GERD (gastroesophageal reflux disease)    hx of esophageal stricture   . Hiatal hernia    Paraesophageal  hernias. s/p fundoplications in 5035 and 04/2013  . History of bronchitis several of yrs ago  . History of colon polyps 03/2009, 03/2011   adenomatous colon polyps, no high grade dysplasia.   [name] History of vertigo    no meds  . Hyperlipidemia    takes Pravastatin daily  . Hypertension    takes Amlodipine,Metoprolol,and Losartan daily  . Nausea 03/2016  . Osteoporosis   . PONV (postoperative nausea and vomiting)    yrs ago  . Slow urinary stream    takes Rapaflo daily  . TIA (transient ischemic attack) 1995   no residual effects noted   Past Surgical History:  Procedure Laterality Date  . APPENDECTOMY     patient unsure of date  . BACK SURGERY    . BLADDER REPAIR    . CHOLECYSTECTOMY     Patient unsure of date.  . CORONARY ANGIOPLASTY  11/16/2001   1 stent  . ESOPHAGEAL MANOMETRY N/A 03/25/2013   Procedure: ESOPHAGEAL MANOMETRY (EM);  Surgeon: Inda Castle, MD;  Location: [facility];  Service: Endoscopy;  Laterality: N/A;  . ESOPHAGOGASTRODUODENOSCOPY N/A 03/15/2013   Procedure: ESOPHAGOGASTRODUODENOSCOPY (EGD);  Surgeon: Jerene Bears, MD;  Location: [facility];  Service: Endoscopy;  Laterality: N/A;  . ESOPHAGOGASTRODUODENOSCOPY N/A 12/25/2015   Procedure: ESOPHAGOGASTRODUODENOSCOPY (EGD);  Surgeon: Doran Stabler, MD;  Location: [facility];  Service: Endoscopy;  Laterality: N/A;  . EYE SURGERY  11/2000   bilateral cataracts with lens implant  . FIXATION KYPHOPLASTY LUMBAR SPINE  X 2   "L3-4"  . HEMORRHOID SURGERY    . HIATAL HERNIA REPAIR  06/03/2011   Procedure: LAPAROSCOPIC REPAIR OF HIATAL HERNIA;  Surgeon: Adin Hector, MD;  Location: [facility];  Service: General;  Laterality: N/A;  . HIATAL HERNIA REPAIR N/A 04/24/2013   Procedure: LAPAROSCOPIC  REPAIR RECURRENT PARASOPHAGEAL HIATAL HERNIA WITH FUNDOPLICATION;  Surgeon: Adin Hector, MD;  Location: [facility];  Service: General;  Laterality: N/A;  . INSERTION OF MESH N/A 04/24/2013   Procedure: INSERTION OF  MESH;  Surgeon: Adin Hector, MD;  Location: [facility];  Service: General;  Laterality: N/A;  . IR KYPHO THORACIC WITH BONE BIOPSY  09/29/2017  . LAPAROSCOPIC LYSIS OF ADHESIONS N/A 04/24/2013   Procedure: LAPAROSCOPIC LYSIS OF ADHESIONS;  Surgeon: Adin Hector, MD;  Location: [facility];  Service: General;  Laterality: N/A;  . LAPAROSCOPIC NISSEN FUNDOPLICATION  8/52/7782   Procedure: LAPAROSCOPIC NISSEN FUNDOPLICATION;  Surgeon: Adin Hector, MD;  Location: [facility];  Service: General;  Laterality: N/A;  . LUMBAR FUSION  12/07/2015   Right L2-3 facetectomy, posterolateral fusion L2-3 with pedicle screws, rods, local bone graft, Vivigen cancellous chips. Fusion extended to the L1 level with pedicle screws and rods  . LUMBAR LAMINECTOMY/DECOMPRESSION MICRODISCECTOMY Right 06/26/2012   Procedure: LUMBAR LAMINECTOMY/DECOMPRESSION MICRODISCECTOMY;  Surgeon: Jessy Oto, MD;  Location: [facility];  Service: Orthopedics;  Laterality: Right;  RIGHT L4-5 MICRODISCECTOMY  . LUMBAR LAMINECTOMY/DECOMPRESSION MICRODISCECTOMY N/A 05/29/2015   Procedure: RIGHT L2-3 MICRODISCECTOMY;  Surgeon: Jessy Oto, MD;  Location: [place];  Service: Orthopedics;  Laterality: N/A;  . TOTAL ABDOMINAL HYSTERECTOMY  1975   partial    reports that she has never smoked. She has never used smokeless tobacco. She reports that she does not drink alcohol or use drugs. family history includes Brain cancer in her brother; Colon cancer (age of onset: 66) in her mother; Heart disease in her brother and brother; Heart disease (age of onset: 26) in her father; Kidney disease in her daughter. Allergies  Allergen Reactions  . Ace Inhibitors Cough    Per Dr Melvyn Novas pulmonology 2013  . Aspartame Diarrhea and Nausea And Vomiting  . Atorvastatin Other (See Comments)    Muscle aches  . Biaxin [Clarithromycin] Other (See Comments)    PATIENT CAN TOLERATE Z-PAK.  Is written on patient's paper chart.  [name] [Roflumilast] Nausea And Vomiting    . Erythromycin Other (See Comments)    PATIENT CAN TOLERATE Z-PAK.  It is written on patient's paper chart.  . Levofloxacin Nausea And Vomiting      Outpatient Encounter Medications as of 10/24/2017  Medication Sig  . acetaminophen (TYLENOL) 500 MG tablet Take 1,000 mg by mouth every 6 (six) hours as needed for mild pain.  [name] albuterol (PROAIR HFA) 108 (90 Base) MCG/ACT inhaler Inhale 2 puffs into the lungs every 6 (six) hours as needed for wheezing or shortness of breath.  . ALPRAZolam (XANAX) 0.5 MG tablet Take 1/2 to 1 tablet 2 to 3 x / day ONLY if Anxiety Attack and please try to limit to 5 days /week to avoid addiction  . amLODipine (NORVASC) 10 MG tablet TAKE 1 TABLET BY MOUTH AT BEDTIME  . Cholecalciferol (VITAMIN D3) 5000 units CAPS Take 10,000 Units by mouth daily.  . diclofenac sodium (VOLTAREN)  1 % GEL Apply 4 g topically 4 (four) times daily. (Patient taking differently: Apply 4 g topically 4 (four) times daily as needed (pain). )  . dicyclomine (BENTYL) 10 MG capsule TAKE 1 CAPSULE BY MOUTH EVERY 8 HOURS AS NEEDED FOR SPASM  . gabapentin (NEURONTIN) 300 MG capsule TAKE 1 CAPSULE BY MOUTH THREE TIMES DAILY FOR  CHRONIC  PAIN.  [name] ipratropium-albuterol (DUONEB) 0.5-2.5 (3) MG/3ML SOLN USE ONE VIAL IN NEBULIZER 4 TIMES DAILY (Patient taking differently: Take 3 mLs by nebulization every 6 (six) hours as needed (wheezing). USE ONE VIAL IN NEBULIZER 4 TIMES DAILY as needed)  . ketoconazole (NIZORAL) 2 % cream APPLY 1 APPLICATION TOPICALLY TWO TIMES DAILY  . losartan (COZAAR) 100 MG tablet TAKE ONE TABLET BY MOUTH ONCE DAILY (DISCONTINUE  BENICAR)  . meclizine (ANTIVERT) 25 MG tablet 1/2-1 pill up to 3 times daily for motion sickness/dizziness  . metoprolol tartrate (LOPRESSOR) 50 MG tablet Take 50 mg by mouth at bedtime.  . mometasone-formoterol (DULERA) 100-5 MCG/ACT AERO Inhale 2 puffs into the lungs every 12 (twelve) hours.  [name] neomycin-polymyxin b-dexamethasone (MAXITROL) 3.5-10000-0.1  OINT APPLY TO STYE FOUR TIMES DAILY.  . nitroGLYCERIN (NITROSTAT) 0.4 MG SL tablet Place 1 tablet (0.4 mg total) under the tongue every 5 (five) minutes as needed for chest pain.  [name] ondansetron (ZOFRAN-ODT) 8 MG disintegrating tablet DISSOLVE ONE TABLET IN MOUTH EVERY 8 HOURS AS NEEDED FOR NAUSEA AND  VOMITING  . OVER THE COUNTER MEDICATION Take 1,000 mg by mouth 2 (two) times daily. [place] Calcium 1000mg   . oxyCODONE-acetaminophen (PERCOCET/ROXICET) 5-325 MG tablet Take 1 tablet by mouth every 4 (four) hours as needed for severe pain.  . polyethylene glycol powder (GLYCOLAX/MIRALAX) powder Take 17 g by mouth 2 (two) times daily.  . pravastatin (PRAVACHOL) 40 MG tablet Take 1 tablet (40 mg total) by mouth daily.  . promethazine (PHENERGAN) 25 MG tablet Take 1 tablet (25 mg total) by mouth every 6 (six) hours as needed for nausea or vomiting.  . senna-docusate (SENOKOT-S) 8.6-50 MG tablet Take 2 tablets by mouth 2 (two) times daily.  . silodosin (RAPAFLO) 8 MG CAPS capsule Take 8 mg by mouth daily with breakfast.   . [DISCONTINUED] metoprolol tartrate (LOPRESSOR) 50 MG tablet TAKE ONE TABLET BY MOUTH TWICE DAILY FOR  BLOOD  PRESSURE (Patient taking differently: TAKE ONE TABLET BY MOUTH IN THE EVENING FOR BLOOD  PRESSURE)   No facility-administered encounter medications on file as of 10/24/2017.      REVIEW OF SYSTEMS  : All other systems reviewed and negative except where noted in the History of Present Illness.   PHYSICAL EXAM: BP 110/70   Pulse 68   Ht 5' 2.25" (1.581 m)   Wt 163 lb (73.9 kg)   BMI 29.57 kg/m  General: Well developed white female in no acute distress Head: Normocephalic and atraumatic Eyes:  Sclerae anicteric, conjunctiva pink. Ears: Normal auditory acuity Lungs:  Wheezing noted B/L; no increased WOB. Heart: Regular rate and rhythm; no M/R/G. Abdomen:  Not performed since patient in back brace and having back pain from compression fractures. Musculoskeletal:  Symmetrical with no gross deformities  Skin: No lesions on visible extremities Extremities: No edema  Neurological: Alert oriented x 4, grossly non-focal Psychological:  Alert and cooperative. Normal mood and affect  ASSESSMENT AND PLAN: *Dysphagia:  Suspect secondary to dysmotility and possibly a component from fundoplication.  Had normal EGD in 12/2015.  Dysphagia is intermittent.  Will check UGI.  CC:  [name], MD

## 2017-10-24 NOTE — Progress Notes (Signed)
Agree with assessment and plan. Pending results, may consider manometry as well.

## 2017-10-24 NOTE — Patient Instructions (Signed)
You have been scheduled for an Upper GI Series at [facility]. Your appointment is on 11-01-17 at 11:00 am. Please arrive 15 minutes prior to your test for registration. Make certain not to have anything to eat or drink 3 hours before your test. If you need to reschedule, please contact radiology at [phone]. --------------------------------------------------------------------------------------------------------------- An upper GI series uses x rays to help diagnose problems of the upper GI tract, which includes the esophagus, stomach, and duodenum. The duodenum is the first part of the small intestine. An upper GI series is conducted by a radiology technologist or a radiologist-a doctor who specializes in x-ray imaging-at a hospital or outpatient center. While sitting or standing in front of an x-ray machine, the patient drinks barium liquid, which is often white and has a chalky consistency and taste. The barium liquid coats the lining of the upper GI tract and makes signs of disease show up more clearly on x rays. X-ray video, called fluoroscopy, is used to view the barium liquid moving through the esophagus, stomach, and duodenum. Additional x rays and fluoroscopy are performed while the patient lies on an x-ray table. To fully coat the upper GI tract with barium liquid, the technologist or radiologist may press on the abdomen or ask the patient to change position. Patients hold still in various positions, allowing the technologist or radiologist to take x rays of the upper GI tract at different angles. If a technologist conducts the upper GI series, a radiologist will later examine the images to look for problems.  This test typically takes about 1 hour to complete ---------------------------------------------------------------------------------------------------------------------------------------------  This test typically takes around 1 hour to complete.  Important Drink plenty of  water (8-10 cups/day) for a few days following the procedure to avoid constipation and blockage. The barium will make your stools white for a few days. --------------------------------------------------------------------------------------------------------------------------------------------

## 2017-10-26 ENCOUNTER — Ambulatory Visit (INDEPENDENT_AMBULATORY_CARE_PROVIDER_SITE_OTHER): Payer: PPO | Admitting: Specialist

## 2017-10-26 ENCOUNTER — Ambulatory Visit (INDEPENDENT_AMBULATORY_CARE_PROVIDER_SITE_OTHER): Payer: PPO

## 2017-10-26 ENCOUNTER — Encounter (INDEPENDENT_AMBULATORY_CARE_PROVIDER_SITE_OTHER): Payer: Self-pay | Admitting: Specialist

## 2017-10-26 ENCOUNTER — Telehealth (INDEPENDENT_AMBULATORY_CARE_PROVIDER_SITE_OTHER): Payer: Self-pay | Admitting: Specialist

## 2017-10-26 VITALS — BP 140/82 | HR 71 | Ht 63.0 in | Wt 161.0 lb

## 2017-10-26 DIAGNOSIS — S22080A Wedge compression fracture of T11-T12 vertebra, initial encounter for closed fracture: Secondary | ICD-10-CM | POA: Diagnosis not present

## 2017-10-26 DIAGNOSIS — E639 Nutritional deficiency, unspecified: Secondary | ICD-10-CM | POA: Diagnosis not present

## 2017-10-26 DIAGNOSIS — M4005 Postural kyphosis, thoracolumbar region: Secondary | ICD-10-CM | POA: Diagnosis not present

## 2017-10-26 DIAGNOSIS — M4326 Fusion of spine, lumbar region: Secondary | ICD-10-CM | POA: Diagnosis not present

## 2017-10-26 NOTE — Telephone Encounter (Signed)
I faxed order for back brace to [facility]

## 2017-10-26 NOTE — Patient Instructions (Addendum)
Go to the [employer] brace shop and have the hard longer brace refitted and start wearing this  When ever you are out of bed you need wear a longer brace from the upper chest above breasts down to the pelvis to take the stress off the upper thoracic spine.  Lab tests today to assess whether you have a protein that shows if there is a bone marrow cause for the weakening of your bones.  Keep an appointment with Dr. Cy Blamer to see if we can improve your bone strength more aggresively. We are referring your to a nutritionalist to check that you are getting adequate diet of proteins, calcium and vitamins.  Avoid frequent bending and stooping  No lifting greater than 10 lbs. May use ice or moist heat for pain. Weight loss is of benefit. Handicap license is approved.

## 2017-10-26 NOTE — Telephone Encounter (Signed)
[employer] called asking for a new back brace order to be faxed over. CB # [phone]

## 2017-10-26 NOTE — Progress Notes (Signed)
Office Visit Note   Patient: [name] [name]           Date of Birth: March 25, 1936           MRN: [id number] Visit Date: 10/26/2017              Requested by: [name], [address], [place] [address] PCP: [name], MD   Assessment & Plan: Visit Diagnoses:  1. Fusion of lumbar spine   2. Postural kyphosis of lumbar region   3. Compression fracture of T11 vertebra (HCC)   4. Very poor nutrition     Plan: Go to the [employer] brace shop and have the hard longer brace refitted and start wearing this  When ever you are out of bed you need wear a longer brace from the upper chest above breasts down to the pelvis to take the stress off the upper thoracic spine.  Lab tests today to assess whether you have a protein that shows if there is a bone marrow cause for the weakening of your bones.  Keep an appointment with Dr. Cy Blamer to see if we can improve your bone strength more aggresively. We are referring your to a nutritionalist to check that you are getting adequate diet of proteins, calcium and vitamins.  Avoid frequent bending and stooping  No lifting greater than 10 lbs. May use ice or moist heat for pain. Weight loss is of benefit. Handicap license is approved.  Follow-Up Instructions: Return in about 2 weeks (around 11/09/2017).   Orders:  Orders Placed This Encounter  Procedures  . XR Lumbar Spine 2-3 Views  . Protein Electrophoresis, (serum)  . Sed Rate (ESR)  . Ambulatory referral to Rheumatology  . Ambulatory referral to Nutrition and Diabetic Education   No orders of the defined types were placed in this encounter.     Procedures: No procedures performed   Clinical Data: No additional findings.   Subjective: Chief Complaint  Patient presents with  . Lower Back - Follow-up    82 year old female with past history of L4-5 HNP with spondylolisthesis that was chronic underwent a microdiscectomy for the acute HNP  and did well until she had a compression fracture. History of chronic lumbar compression fracture L1 with anterior autofusion of L1-2 and focal scoliosis due to disc collapse at L2-3 and L3-4. Eventually had vertebroplasties at L4 and L3. Developed disc herniation and had discectomy at the L3-4 level but then worsened the scoliosis and foramenal stenosis  Requiring a L1 to L4 decompression and fusion. Cemmented pedicle screw and rods. She has has persistent pain that was controlled until May 2019 when she had  Acute severe back pain and was found to have a new compression fracture at the T12 level. A kyphoplasty was done by Dr. Saddie Benders 5/24 and now she is Has had an increase in pain in the right lower thorax after initial decreased pain following the cementing at T12. Seen in [facility] at [facility] on 10/10/2016 and underwent  MRI of the thoracolumbar spine and the pelvis and now there is new uptake at the inferior end plate fo T46. Previous lab show sed rate of 1, elevated Kappa chains 2014, Rest of Protein electrophoresis was fairly unremarkable. Her last bone density 07/2016 consistent with osteoporosis with femoral neck and head at -2.8 and the forearm at -1.8.   Review of Systems  Constitutional: Negative.   HENT: Negative.   Eyes: Negative.  Respiratory: Negative.   Cardiovascular: Negative.   Gastrointestinal: Negative.   Endocrine: Negative.   Genitourinary: Negative.   Musculoskeletal: Negative.   Skin: Negative.   Allergic/Immunologic: Negative.   Neurological: Negative.   Hematological: Negative.   Psychiatric/Behavioral: Negative.      Objective: Vital Signs: BP 140/82 (BP Location: Left Arm, Patient Position: Sitting)   Pulse 71   Ht '5\' 3"'  (1.6 m)   Wt 161 lb (73 kg)   BMI 28.52 kg/m   Physical Exam  Constitutional: She is oriented to person, place, and time. She appears well-developed and well-nourished.  HENT:  Head: Normocephalic and atraumatic.  Eyes: Pupils are  equal, round, and reactive to light. EOM are normal.  Neck: Normal range of motion. Neck supple.  Pulmonary/Chest: Effort normal and breath sounds normal.  Abdominal: Soft. Bowel sounds are normal.  Neurological: She is alert and oriented to person, place, and time.  Skin: Skin is warm and dry.  Psychiatric: She has a normal mood and affect. Her behavior is normal. Judgment and thought content normal.    Back Exam   Tenderness  The patient is experiencing tenderness in the lumbar.  Range of Motion  Extension: abnormal  Flexion: abnormal  Lateral bend right: normal  Lateral bend left: normal  Rotation right: normal  Rotation left: normal   Muscle Strength  Right Quadriceps:  4/5  Left Quadriceps:  5/5  Right Hamstrings:  4/5  Left Hamstrings:  5/5   Tests  Straight leg raise right: negative Straight leg raise left: negative  Reflexes  Patellar: abnormal Achilles: abnormal Babinski's sign: normal   Other  Toe walk: normal Heel walk: normal Sensation: normal Gait: normal  Erythema: no back redness Scars: present      Specialty Comments:  No specialty comments available.  Imaging: Xr Lumbar Spine 2-3 Views  Result Date: 10/26/2017 Mild compression deformity T11, kyphoplasty T12, L2, L3 and L4, Anterolishesis L4-5 grade 1-2. Fusion L1 to L3 with Pedicle screws and rods. The height and alignment through the fusion segment is well maintained.     PMFS History: Patient Active Problem List   Diagnosis Date Noted  . Lumbar radiculopathy, acute 12/07/2015    Priority: High    Class: Chronic  . Herniated lumbar intervertebral disc 06/25/2012    Priority: High    Class: Acute  . CKD (chronic kidney disease), stage III ([place]) 09/27/2017  . Low back pain 09/26/2017  . T12 compression fracture ([place]) 09/26/2017  . Chronic bronchitis ([place]) 08/09/2017  . Atherosclerosis of aorta ([place]) 04/05/2017  . Costochondritis 11/14/2016  . Anxiety state 06/08/2016  .  History of lumbar spinal fusion 04/13/2016  . Spondylolisthesis, lumbar region 04/13/2016  . Leukocytosis 03/28/2016  . Dysphagia   . Spinal stenosis, lumbar region, with neurogenic claudication 05/29/2015  . DDD (degenerative disc disease), lumbar 04/21/2015  . Mixed hyperlipidemia 11/24/2014  . GERD  11/24/2014  . Depression, major, in partial remission ([place]) 09/26/2014  . Chronic pain syndrome 09/26/2014  . Coronary artery disease due to lipid rich plaque   . Osteoporosis 02/10/2014  . Prediabetes 08/14/2013  . Vitamin D deficiency 08/14/2013  . Medication management 08/14/2013  . Constipation, chronic 04/03/2013  . MGUS (monoclonal gammopathy of unknown significance) 03/05/2013  . Vertebral compression fracture ([place]) 06/23/2012  . ESOPHAGEAL STRICTURE 08/28/2008  . Mild intermittent asthma without complication 16/11/3708  . Incarcerated recurrent paraesophageal hiatal hernia s/p lap redo repair [id number] 01/31/2008  . Essential hypertension 07/25/2007   Past Medical History:  Diagnosis Date  . Anxiety    takes Xanax daily as needed  . Asthma    Albuterol daily as needed  . Blood transfusion without reported diagnosis   . CAD (coronary artery disease)    LHC 2003 with 40% pLAD, 30% mLAD, 100% D1 (moderate vessel), 100%  D2 (small vessel), 95% mid small OM2.  Pt had PTCA to D1;  D2 and OM2 small vessels and tx medically;  Last Myoview (2012): anterior infarct seen with scar, no ischemia. EF normal. Pt managed medically;  Lexiscan Myoview (12/14):  Low risk; ant scar with very mild peri-infarct ischemia; EF 55% with ant AK   . Cataract   . Chronic back pain    HNP, DDD, spinal stenosis, vertebral compression fractures.   . Chronic nausea    takes Zofran daily as needed.  normal gastric emptying study 03/2013  . CKD (chronic kidney disease), stage III ([place]) 09/27/2017  . Constipation    felt to be functional. takes Senokot and Miralax daily.  treated with Linzess in past.   .  DIVERTICULOSIS, COLON 08/28/2008   Qualifier: Diagnosis of  By: [name], [name], [name]    . Elevated LFTs 2015   probably med induced DILI, from Augmentin vs Pravastatin  . GERD (gastroesophageal reflux disease)    hx of esophageal stricture   . Hiatal hernia    Paraesophageal hernias. s/p fundoplications in 1771 and 04/2013  . History of bronchitis several of yrs ago  . History of colon polyps 03/2009, 03/2011   adenomatous colon polyps, no high grade dysplasia.   [name] History of vertigo    no meds  . Hyperlipidemia    takes Pravastatin daily  . Hypertension    takes Amlodipine,Metoprolol,and Losartan daily  . Nausea 03/2016  . Osteoporosis   . PONV (postoperative nausea and vomiting)    yrs ago  . Slow urinary stream    takes Rapaflo daily  . TIA (transient ischemic attack) 1995   no residual effects noted    Family History  Problem Relation Age of Onset  . Colon cancer Mother 69  . Heart disease Father 15       heart attack  . Brain cancer Brother        tumor   . Heart disease Brother   . Heart disease Brother   . Kidney disease Daughter     Past Surgical History:  Procedure Laterality Date  . APPENDECTOMY     patient unsure of date  . BACK SURGERY    . BLADDER REPAIR    . CHOLECYSTECTOMY     Patient unsure of date.  . CORONARY ANGIOPLASTY  11/16/2001   1 stent  . ESOPHAGEAL MANOMETRY N/A 03/25/2013   Procedure: ESOPHAGEAL MANOMETRY (EM);  Surgeon: Inda Castle, MD;  Location: [facility];  Service: Endoscopy;  Laterality: N/A;  . ESOPHAGOGASTRODUODENOSCOPY N/A 03/15/2013   Procedure: ESOPHAGOGASTRODUODENOSCOPY (EGD);  Surgeon: Jerene Bears, MD;  [facility];  Service: Endoscopy;  Laterality: N/A;  . ESOPHAGOGASTRODUODENOSCOPY N/A 12/25/2015   Procedure: ESOPHAGOGASTRODUODENOSCOPY (EGD);  Surgeon: Doran Stabler, MD;  Location[facility] ENDOSCOPY;  Service: Endoscopy;  Laterality: N/A;  . EYE SURGERY  11/2000   bilateral cataracts with lens implant  .  FIXATION KYPHOPLASTY LUMBAR SPINE  X 2   "L3-4"  . HEMORRHOID SURGERY    . HIATAL HERNIA REPAIR  06/03/2011   Procedure: LAPAROSCOPIC REPAIR OF HIATAL HERNIA;  Surgeon: Adin Hector, MD;  Location: [facility] ORS;  Service:  General;  Laterality: N/A;  . HIATAL HERNIA REPAIR N/A 04/24/2013   Procedure: LAPAROSCOPIC  REPAIR RECURRENT PARASOPHAGEAL HIATAL HERNIA WITH FUNDOPLICATION;  Surgeon: Adin Hector, MD;  Location: [facility]S;  Service: General;  Laterality: N/A;  . INSERTION OF MESH N/A 04/24/2013   Procedure: INSERTION OF MESH;  Surgeon: Adin Hector, MD;  Location: [facility];  Service: General;  Laterality: N/A;  . IR KYPHO THORACIC WITH BONE BIOPSY  09/29/2017  . LAPAROSCOPIC LYSIS OF ADHESIONS N/A 04/24/2013   Procedure: LAPAROSCOPIC LYSIS OF ADHESIONS;  Surgeon: Adin Hector, MD;  Location: [facility];  Service: General;  Laterality: N/A;  . LAPAROSCOPIC NISSEN FUNDOPLICATION  1/77/9564   Procedure: LAPAROSCOPIC NISSEN FUNDOPLICATION;  Surgeon: Adin Hector, MD;  Location: [facility];  Service: General;  Laterality: N/A;  . LUMBAR FUSION  12/07/2015   Right L2-3 facetectomy, posterolateral fusion L2-3 with pedicle screws, rods, local bone graft, Vivigen cancellous chips. Fusion extended to the L1 level with pedicle screws and rods  . LUMBAR LAMINECTOMY/DECOMPRESSION MICRODISCECTOMY Right 06/26/2012   Procedure: LUMBAR LAMINECTOMY/DECOMPRESSION MICRODISCECTOMY;  Surgeon: Jessy Oto, MD;  Location: [place];  Service: Orthopedics;  Laterality: Right;  RIGHT L4-5 MICRODISCECTOMY  . LUMBAR LAMINECTOMY/DECOMPRESSION MICRODISCECTOMY N/A 05/29/2015   Procedure: RIGHT L2-3 MICRODISCECTOMY;  Surgeon: Jessy Oto, MD;  Location: [place];  Service: Orthopedics;  Laterality: N/A;  . TOTAL ABDOMINAL HYSTERECTOMY  1975   partial   Social History   Occupational History  . Occupation: Retired    Fish farm manager: RETIRED    Comment: worked at [employer]  . Smoking status: Never Smoker  .  Smokeless tobacco: Never Used  Substance and Sexual Activity  . Alcohol use: No  . Drug use: No  . Sexual activity: Never

## 2017-10-27 ENCOUNTER — Other Ambulatory Visit: Payer: Self-pay | Admitting: *Deleted

## 2017-10-27 NOTE — Patient Outreach (Signed)
[facility]) Care Management  10/27/2017  [name] [name] 1936/03/30 [id number]   Case closure  [facility] RN CM will close case per call attempts workflow after no return call from this patient.  An unsuccessful outreach letter was sent on 10/13/17. Multiple attempts to establish contact with patient without success. No response from letter mailed to patient.   Plan: [facility] RN CM will close case at this time.  [facility] RN CM will send case closure letter to MD and patient  [name] L. Lavina Hamman, RN, BSN, CCM [facility] Telephonic Care Management Care Coordinator Direct number [phone]  [facility] number [phone] Fax number [phone]

## 2017-10-30 ENCOUNTER — Other Ambulatory Visit (HOSPITAL_COMMUNITY): Payer: Self-pay | Admitting: Interventional Radiology

## 2017-10-30 DIAGNOSIS — M546 Pain in thoracic spine: Secondary | ICD-10-CM

## 2017-10-30 DIAGNOSIS — S22080A Wedge compression fracture of T11-T12 vertebra, initial encounter for closed fracture: Secondary | ICD-10-CM

## 2017-10-30 LAB — PROTEIN ELECTROPHORESIS, SERUM
ALBUMIN ELP: 4.1 g/dL (ref 3.8–4.8)
Alpha 1: 0.3 g/dL (ref 0.2–0.3)
Alpha 2: 0.8 g/dL (ref 0.5–0.9)
BETA GLOBULIN: 0.5 g/dL (ref 0.4–0.6)
Beta 2: 0.4 g/dL (ref 0.2–0.5)
GAMMA GLOBULIN: 1.1 g/dL (ref 0.8–1.7)
Total Protein: 7.2 g/dL (ref 6.1–8.1)

## 2017-10-30 LAB — SEDIMENTATION RATE: SED RATE: 9 mm/h (ref 0–30)

## 2017-10-31 ENCOUNTER — Telehealth (INDEPENDENT_AMBULATORY_CARE_PROVIDER_SITE_OTHER): Payer: Self-pay | Admitting: Specialist

## 2017-10-31 DIAGNOSIS — M961 Postlaminectomy syndrome, not elsewhere classified: Secondary | ICD-10-CM | POA: Diagnosis not present

## 2017-10-31 DIAGNOSIS — M4726 Other spondylosis with radiculopathy, lumbar region: Secondary | ICD-10-CM | POA: Diagnosis not present

## 2017-10-31 DIAGNOSIS — Z79891 Long term (current) use of opiate analgesic: Secondary | ICD-10-CM | POA: Diagnosis not present

## 2017-10-31 DIAGNOSIS — G894 Chronic pain syndrome: Secondary | ICD-10-CM | POA: Diagnosis not present

## 2017-10-31 NOTE — Telephone Encounter (Signed)
Pam with [facility] sees the diagnosis from our office but is wondering why this patient was referred to them, if it was for nutrition or diabetes? Please advise their office # [phone]

## 2017-10-31 NOTE — Telephone Encounter (Signed)
I called and spoke with Pam, changed dx code to R62.7-Failure to Thrive as an adult

## 2017-11-01 ENCOUNTER — Ambulatory Visit (HOSPITAL_COMMUNITY): Payer: PPO

## 2017-11-01 ENCOUNTER — Ambulatory Visit (HOSPITAL_COMMUNITY)
Admission: RE | Admit: 2017-11-01 | Discharge: 2017-11-01 | Disposition: A | Discharge status: Home / Self Care | Payer: PPO | Source: Ambulatory Visit | Attending: Gastroenterology | Admitting: Gastroenterology

## 2017-11-01 DIAGNOSIS — M4854XA Collapsed vertebra, not elsewhere classified, thoracic region, initial encounter for fracture: Secondary | ICD-10-CM | POA: Insufficient documentation

## 2017-11-01 DIAGNOSIS — K449 Diaphragmatic hernia without obstruction or gangrene: Secondary | ICD-10-CM | POA: Diagnosis not present

## 2017-11-01 DIAGNOSIS — K225 Diverticulum of esophagus, acquired: Secondary | ICD-10-CM | POA: Insufficient documentation

## 2017-11-01 DIAGNOSIS — R1310 Dysphagia, unspecified: Secondary | ICD-10-CM | POA: Insufficient documentation

## 2017-11-02 ENCOUNTER — Other Ambulatory Visit: Payer: Self-pay | Admitting: Radiology

## 2017-11-02 ENCOUNTER — Other Ambulatory Visit: Payer: Self-pay | Admitting: Student

## 2017-11-02 MED ORDER — DEXTROSE 5 % IV SOLN: 2.0000 g | Freq: Once | INTRAVENOUS | Status: DC

## 2017-11-03 ENCOUNTER — Ambulatory Visit (HOSPITAL_COMMUNITY)
Admission: RE | Admit: 2017-11-03 | Discharge: 2017-11-03 | Disposition: A | Discharge status: Home / Self Care | Payer: PPO | Source: Ambulatory Visit | Attending: Interventional Radiology | Admitting: Interventional Radiology

## 2017-11-03 ENCOUNTER — Other Ambulatory Visit (HOSPITAL_COMMUNITY): Payer: Self-pay | Admitting: Interventional Radiology

## 2017-11-03 ENCOUNTER — Other Ambulatory Visit (INDEPENDENT_AMBULATORY_CARE_PROVIDER_SITE_OTHER): Payer: Self-pay | Admitting: Specialist

## 2017-11-03 ENCOUNTER — Ambulatory Visit: Payer: PPO | Admitting: Internal Medicine

## 2017-11-03 DIAGNOSIS — I2510 Atherosclerotic heart disease of native coronary artery without angina pectoris: Secondary | ICD-10-CM | POA: Insufficient documentation

## 2017-11-03 DIAGNOSIS — Z981 Arthrodesis status: Secondary | ICD-10-CM | POA: Insufficient documentation

## 2017-11-03 DIAGNOSIS — M546 Pain in thoracic spine: Secondary | ICD-10-CM

## 2017-11-03 DIAGNOSIS — G8929 Other chronic pain: Secondary | ICD-10-CM | POA: Diagnosis not present

## 2017-11-03 DIAGNOSIS — K5900 Constipation, unspecified: Secondary | ICD-10-CM | POA: Insufficient documentation

## 2017-11-03 DIAGNOSIS — Z8673 Personal history of transient ischemic attack (TIA), and cerebral infarction without residual deficits: Secondary | ICD-10-CM | POA: Diagnosis not present

## 2017-11-03 DIAGNOSIS — F419 Anxiety disorder, unspecified: Secondary | ICD-10-CM | POA: Insufficient documentation

## 2017-11-03 DIAGNOSIS — Z8249 Family history of ischemic heart disease and other diseases of the circulatory system: Secondary | ICD-10-CM | POA: Diagnosis not present

## 2017-11-03 DIAGNOSIS — I129 Hypertensive chronic kidney disease with stage 1 through stage 4 chronic kidney disease, or unspecified chronic kidney disease: Secondary | ICD-10-CM | POA: Diagnosis not present

## 2017-11-03 DIAGNOSIS — E785 Hyperlipidemia, unspecified: Secondary | ICD-10-CM | POA: Diagnosis not present

## 2017-11-03 DIAGNOSIS — M810 Age-related osteoporosis without current pathological fracture: Secondary | ICD-10-CM | POA: Diagnosis not present

## 2017-11-03 DIAGNOSIS — Z955 Presence of coronary angioplasty implant and graft: Secondary | ICD-10-CM | POA: Insufficient documentation

## 2017-11-03 DIAGNOSIS — K219 Gastro-esophageal reflux disease without esophagitis: Secondary | ICD-10-CM | POA: Insufficient documentation

## 2017-11-03 DIAGNOSIS — S22080A Wedge compression fracture of T11-T12 vertebra, initial encounter for closed fracture: Secondary | ICD-10-CM | POA: Diagnosis not present

## 2017-11-03 DIAGNOSIS — N183 Chronic kidney disease, stage 3 (moderate): Secondary | ICD-10-CM | POA: Insufficient documentation

## 2017-11-03 DIAGNOSIS — J45909 Unspecified asthma, uncomplicated: Secondary | ICD-10-CM | POA: Insufficient documentation

## 2017-11-03 DIAGNOSIS — I252 Old myocardial infarction: Secondary | ICD-10-CM | POA: Insufficient documentation

## 2017-11-03 DIAGNOSIS — M5126 Other intervertebral disc displacement, lumbar region: Secondary | ICD-10-CM | POA: Diagnosis not present

## 2017-11-03 DIAGNOSIS — M4854XA Collapsed vertebra, not elsewhere classified, thoracic region, initial encounter for fracture: Secondary | ICD-10-CM | POA: Diagnosis not present

## 2017-11-03 DIAGNOSIS — R0781 Pleurodynia: Secondary | ICD-10-CM

## 2017-11-03 HISTORY — PX: IR KYPHO THORACIC WITH BONE BIOPSY: IMG5518

## 2017-11-03 LAB — CBC
HCT: 44.8 % (ref 36.0–46.0)
Hemoglobin: 14.4 g/dL (ref 12.0–15.0)
MCH: 31.5 pg (ref 26.0–34.0)
MCHC: 32.1 g/dL (ref 30.0–36.0)
MCV: 98 fL (ref 78.0–100.0)
PLATELETS: 316 10*3/uL (ref 150–400)
RBC: 4.57 MIL/uL (ref 3.87–5.11)
RDW: 13.2 % (ref 11.5–15.5)
WBC: 8.1 10*3/uL (ref 4.0–10.5)

## 2017-11-03 LAB — PROTIME-INR
INR: 0.92
Prothrombin Time: 12.2 seconds (ref 11.4–15.2)

## 2017-11-03 LAB — APTT: aPTT: 29 seconds (ref 24–36)

## 2017-11-03 MED ORDER — GELATIN ABSORBABLE 12-7 MM EX MISC
CUTANEOUS | Status: AC
  Filled 2017-11-03: qty 1

## 2017-11-03 MED ORDER — BUPIVACAINE HCL (PF) 0.5 % IJ SOLN
INTRAMUSCULAR | Status: AC
  Filled 2017-11-03: qty 30

## 2017-11-03 MED ORDER — FENTANYL CITRATE (PF) 100 MCG/2ML IJ SOLN
INTRAMUSCULAR | Status: AC
  Filled 2017-11-03: qty 2

## 2017-11-03 MED ORDER — HYDROMORPHONE HCL 1 MG/ML IJ SOLN
INTRAMUSCULAR | Status: AC
  Filled 2017-11-03: qty 2

## 2017-11-03 MED ORDER — MIDAZOLAM HCL 2 MG/2ML IJ SOLN
INTRAMUSCULAR | Status: AC
  Filled 2017-11-03: qty 2

## 2017-11-03 MED ORDER — MIDAZOLAM HCL 2 MG/2ML IJ SOLN
INTRAMUSCULAR | Status: AC | PRN
  Administered 2017-11-03 (×2): 1 mg via INTRAVENOUS

## 2017-11-03 MED ORDER — BUPIVACAINE HCL 0.5 % IJ SOLN
INTRAMUSCULAR | Status: AC | PRN
  Administered 2017-11-03: 30 mL

## 2017-11-03 MED ORDER — SODIUM CHLORIDE 0.9 % IV SOLN: INTRAVENOUS | Status: AC

## 2017-11-03 MED ORDER — ONDANSETRON HCL 4 MG/2ML IJ SOLN
4.0000 mg | Freq: Once | INTRAMUSCULAR | Status: AC
  Administered 2017-11-03: 4 mg via INTRAVENOUS

## 2017-11-03 MED ORDER — ONDANSETRON HCL 4 MG/2ML IJ SOLN
INTRAMUSCULAR | Status: AC
  Filled 2017-11-03: qty 2

## 2017-11-03 MED ORDER — SODIUM CHLORIDE 0.9 % IV SOLN
1.0000 mg/h | Freq: Once | INTRAVENOUS | Status: DC
  Administered 2017-11-03: 1 mg/h via INTRAVENOUS

## 2017-11-03 MED ORDER — OXYCODONE-ACETAMINOPHEN 5-325 MG PO TABS
1.0000 | ORAL_TABLET | Freq: Once | ORAL | Status: AC
  Administered 2017-11-03: 1 via ORAL

## 2017-11-03 MED ORDER — OXYCODONE-ACETAMINOPHEN 5-325 MG PO TABS
ORAL_TABLET | ORAL | Status: AC
  Filled 2017-11-03: qty 1

## 2017-11-03 MED ORDER — FENTANYL CITRATE (PF) 100 MCG/2ML IJ SOLN
INTRAMUSCULAR | Status: AC | PRN
  Administered 2017-11-03 (×2): 25 ug via INTRAVENOUS

## 2017-11-03 MED ORDER — TOBRAMYCIN SULFATE 1.2 G IJ SOLR
INTRAMUSCULAR | Status: AC
  Filled 2017-11-03: qty 1.2

## 2017-11-03 MED ORDER — SODIUM CHLORIDE 0.9 % IV SOLN: INTRAVENOUS | Status: DC

## 2017-11-03 MED ORDER — HYDROMORPHONE HCL 1 MG/ML IJ SOLN
INTRAMUSCULAR | Status: AC
  Administered 2017-11-03: 13:00:00
  Filled 2017-11-03: qty 1

## 2017-11-03 MED ORDER — HYDROMORPHONE HCL 1 MG/ML IJ SOLN
INTRAMUSCULAR | Status: AC | PRN
  Administered 2017-11-03: 1 mg via INTRAVENOUS

## 2017-11-03 MED ORDER — HYDROMORPHONE HCL 1 MG/ML IJ SOLN
1.0000 mg | Freq: Once | INTRAMUSCULAR | Status: DC
Start: 2017-11-03 — End: 2017-11-04

## 2017-11-03 MED ORDER — IOPAMIDOL (ISOVUE-300) INJECTION 61%
INTRAVENOUS | Status: AC
  Filled 2017-11-03: qty 50

## 2017-11-03 MED ORDER — CEFAZOLIN SODIUM-DEXTROSE 2-4 GM/100ML-% IV SOLN
2.0000 g | INTRAVENOUS | Status: AC
Start: 2017-11-03 — End: 2017-11-03
  Administered 2017-11-03: 2 g via INTRAVENOUS

## 2017-11-03 MED ORDER — CEFAZOLIN SODIUM-DEXTROSE 2-4 GM/100ML-% IV SOLN
INTRAVENOUS | Status: AC
  Filled 2017-11-03: qty 100

## 2017-11-03 NOTE — Telephone Encounter (Signed)
Diclofenac Gel Refill request

## 2017-11-03 NOTE — Progress Notes (Signed)
PA paged due to patient with incomplete resolve of back pain after procedure today. This is expected given the significance of her pain prior to procedure. She takes percocet 5/325 q 4 hrs as needed at home. One time dose of home Percocet has been ordered.   Brynda Greathouse, MS RD PA-C

## 2017-11-03 NOTE — H&P (Signed)
Chief Complaint: Patient was seen in consultation today for thoracic 11 compression fracture.  Supervising Physician: Luanne Bras  Patient Status: [facility] - Out-pt  History of Present Illness: [name] [name] is a 82 y.o. female with a past medical history of hypertension, hyperlipidemia, CAD, TIA 1995, CKD stage III, asthma, osteoporosis, GERD, diverticulosis, and anxiety.  She is known to [facility] and has had multiple kyphoplasties/vertebroplasties with Dr. Estanislado Pandy: 08/20/2013: lumbar 3 vertebroplasty, lumbar 4 kyphoplasty 11/05/2013: repeat lumbar 3 vertebroplasty, repeat lumbar 4 vertebroplasty 06/19/2014: lumbar 2 kyphoplasty 09/29/2017: thoracic 12 kyphoplasty  Patient presented to ED 10/10/2017 with complaint of lower back pain. She underwent MRI.  MRI lumbar spine/sacrum SI joints 10/10/2017: 1. Mild edema extending through the anterior aspect of the inferior endplate of U27, consistent with acute endplate fracture, new relative to recent MRI from 09/26/2017. No associated height loss. 2. Sequelae of interval vertebral augmentation for recently identified acute T12 fracture. Stable height loss with persistent marrow edema. 3. Chronic compression fractures at L1 through L4 with sequelae of prior vertebral augmentation, stable. 4. Multilevel degenerative changes as above, stable from previous. 5. Mild intramuscular edema within the lower posterior paraspinous musculature, suggesting muscular strain. 6. Otherwise negative MRI of the sacrum. No acute fracture or other acute abnormality.  Patient presents today for possible image-guided thoracic 11 kyphoplasty/vertebroplasty. Patient awake and alert laying in bed. Accompanied by husband at bedside. Complains of lower back pain, rated 10/10 at this time. States the pain is unbearable. Denies fever, chills, numbness/tingling down legs, or bladder/bowel incontinence.  Past Medical History:  Diagnosis Date  . Anxiety    takes Xanax  daily as needed  . Asthma    Albuterol daily as needed  . Blood transfusion without reported diagnosis   . CAD (coronary artery disease)    LHC 2003 with 40% pLAD, 30% mLAD, 100% D1 (moderate vessel), 100%  D2 (small vessel), 95% mid small OM2.  Pt had PTCA to D1;  D2 and OM2 small vessels and tx medically;  Last Myoview (2012): anterior infarct seen with scar, no ischemia. EF normal. Pt managed medically;  Lexiscan Myoview (12/14):  Low risk; ant scar with very mild peri-infarct ischemia; EF 55% with ant AK   . Cataract   . Chronic back pain    HNP, DDD, spinal stenosis, vertebral compression fractures.   . Chronic nausea    takes Zofran daily as needed.  normal gastric emptying study 03/2013  . CKD (chronic kidney disease), stage III ([place]) 09/27/2017  . Constipation    felt to be functional. takes Senokot and Miralax daily.  treated with Linzess in past.   . DIVERTICULOSIS, COLON 08/28/2008   Qualifier: Diagnosis of  By: [name], [name], [name]    . Elevated LFTs 2015   probably med induced DILI, from Augmentin vs Pravastatin  . GERD (gastroesophageal reflux disease)    hx of esophageal stricture   . Hiatal hernia    Paraesophageal hernias. s/p fundoplications in 2536 and 04/2013  . History of bronchitis several of yrs ago  . History of colon polyps 03/2009, 03/2011   adenomatous colon polyps, no high grade dysplasia.   [name] History of vertigo    no meds  . Hyperlipidemia    takes Pravastatin daily  . Hypertension    takes Amlodipine,Metoprolol,and Losartan daily  . Nausea 03/2016  . Osteoporosis   . PONV (postoperative nausea and vomiting)    yrs ago  . Slow urinary stream    takes Rapaflo daily  .  TIA (transient ischemic attack) 1995   no residual effects noted    Past Surgical History:  Procedure Laterality Date  . APPENDECTOMY     patient unsure of date  . BACK SURGERY    . BLADDER REPAIR    . CHOLECYSTECTOMY     Patient unsure of date.  . CORONARY ANGIOPLASTY   11/16/2001   1 stent  . ESOPHAGEAL MANOMETRY N/A 03/25/2013   Procedure: ESOPHAGEAL MANOMETRY (EM);  Surgeon: Inda Castle, MD;  Location: [facility] ENDOSCOPY;  Service: Endoscopy;  Laterality: N/A;  . ESOPHAGOGASTRODUODENOSCOPY N/A 03/15/2013   Procedure: ESOPHAGOGASTRODUODENOSCOPY (EGD);  Surgeon: Jerene Bears, MD;  Location: [facility] ENDOSCOPY;  Service: Endoscopy;  Laterality: N/A;  . ESOPHAGOGASTRODUODENOSCOPY N/A 12/25/2015   Procedure: ESOPHAGOGASTRODUODENOSCOPY (EGD);  Surgeon: Doran Stabler, MD;  Location: [facility] ENDOSCOPY;  Service: Endoscopy;  Laterality: N/A;  . EYE SURGERY  11/2000   bilateral cataracts with lens implant  . FIXATION KYPHOPLASTY LUMBAR SPINE  X 2   "L3-4"  . HEMORRHOID SURGERY    . HIATAL HERNIA REPAIR  06/03/2011   Procedure: LAPAROSCOPIC REPAIR OF HIATAL HERNIA;  Surgeon: Adin Hector, MD;  Location: [facility] ORS;  Service: General;  Laterality: N/A;  . HIATAL HERNIA REPAIR N/A 04/24/2013   Procedure: LAPAROSCOPIC  REPAIR RECURRENT PARASOPHAGEAL HIATAL HERNIA WITH FUNDOPLICATION;  Surgeon: Adin Hector, MD;  Location: [facility] ORS;  Service: General;  Laterality: N/A;  . INSERTION OF MESH N/A 04/24/2013   Procedure: INSERTION OF MESH;  Surgeon: Adin Hector, MD;  Location: [facility] ORS;  Service: General;  Laterality: N/A;  . IR KYPHO THORACIC WITH BONE BIOPSY  09/29/2017  . LAPAROSCOPIC LYSIS OF ADHESIONS N/A 04/24/2013   Procedure: LAPAROSCOPIC LYSIS OF ADHESIONS;  Surgeon: Adin Hector, MD;  Location: [facility] ORS;  Service: General;  Laterality: N/A;  . LAPAROSCOPIC NISSEN FUNDOPLICATION  10/08/930   Procedure: LAPAROSCOPIC NISSEN FUNDOPLICATION;  Surgeon: Adin Hector, MD;  Location: [facility] ORS;  Service: General;  Laterality: N/A;  . LUMBAR FUSION  12/07/2015   Right L2-3 facetectomy, posterolateral fusion L2-3 with pedicle screws, rods, local bone graft, Vivigen cancellous chips. Fusion extended to the L1 level with pedicle screws and rods  . LUMBAR LAMINECTOMY/DECOMPRESSION  MICRODISCECTOMY Right 06/26/2012   Procedure: LUMBAR LAMINECTOMY/DECOMPRESSION MICRODISCECTOMY;  Surgeon: Jessy Oto, MD;  Location: [place];  Service: Orthopedics;  Laterality: Right;  RIGHT L4-5 MICRODISCECTOMY  . LUMBAR LAMINECTOMY/DECOMPRESSION MICRODISCECTOMY N/A 05/29/2015   Procedure: RIGHT L2-3 MICRODISCECTOMY;  Surgeon: Jessy Oto, MD;  Location: [place];  Service: Orthopedics;  Laterality: N/A;  . TOTAL ABDOMINAL HYSTERECTOMY  1975   partial    Allergies: Ace inhibitors; Aspartame; Atorvastatin; Biaxin [clarithromycin]; Daliresp [roflumilast]; Erythromycin; and Levofloxacin  Medications: Prior to Admission medications   Medication Sig Start Date End Date Taking? Authorizing Provider  acetaminophen (TYLENOL) 500 MG tablet Take 1,000 mg by mouth every 6 (six) hours as needed for mild pain.    [provider]  albuterol (PROAIR HFA) 108 (90 Base) MCG/ACT inhaler Inhale 2 puffs into the lungs every 6 (six) hours as needed for wheezing or shortness of breath. 09/22/16   Vicie Mutters, PA-C  ALPRAZolam Duanne Moron) 0.5 MG tablet Take 1/2 to 1 tablet 2 to 3 x / day ONLY if Anxiety Attack and please try to limit to 5 days /week to avoid addiction 08/14/17   [name], MD  amLODipine (NORVASC) 10 MG tablet TAKE 1 TABLET BY MOUTH AT BEDTIME 09/08/17   [name], MD  Cholecalciferol (VITAMIN D3) 5000 units CAPS Take 10,000 Units by mouth daily.    [provider]  diclofenac sodium (VOLTAREN) 1 % GEL Apply 4 g topically 4 (four) times daily. Patient taking differently: Apply 4 g topically 4 (four) times daily as needed (pain).  12/06/16   Jessy Oto, MD  dicyclomine (BENTYL) 10 MG capsule TAKE 1 CAPSULE BY MOUTH EVERY 8 HOURS AS NEEDED FOR SPASM 08/04/17   Armbruster, Carlota Raspberry, MD  gabapentin (NEURONTIN) 300 MG capsule TAKE 1 CAPSULE BY MOUTH THREE TIMES DAILY FOR  CHRONIC  PAIN. 10/06/17   [name], MD  ipratropium-albuterol (DUONEB) 0.5-2.5 (3) MG/3ML SOLN USE  ONE VIAL IN NEBULIZER 4 TIMES DAILY Patient taking differently: Take 3 mLs by nebulization every 6 (six) hours as needed (wheezing). USE ONE VIAL IN NEBULIZER 4 TIMES DAILY as needed 08/09/17   [name], NP  ketoconazole (NIZORAL) 2 % cream APPLY 1 APPLICATION TOPICALLY TWO TIMES DAILY Patient taking differently: APPLY 1 APPLICATION TOPICALLY TWO TIMES DAILY AS NEEDED FOR IRRITATION 02/10/17   Vicie Mutters, PA-C  losartan (COZAAR) 100 MG tablet TAKE ONE TABLET BY MOUTH ONCE DAILY (DISCONTINUE  BENICAR) 06/22/17   [name], MD  meclizine (ANTIVERT) 25 MG tablet 1/2-1 pill up to 3 times daily for motion sickness/dizziness 06/08/16   Vicie Mutters, PA-C  metoprolol tartrate (LOPRESSOR) 50 MG tablet Take 50 mg by mouth at bedtime.    [provider]  mometasone-formoterol (DULERA) 100-5 MCG/ACT AERO Inhale 2 puffs into the lungs every 12 (twelve) hours. 09/08/17   [name], MD  neomycin-polymyxin b-dexamethasone (MAXITROL) 3.5-10000-0.1 OINT APPLY TO STYE FOUR TIMES DAILY. Patient taking differently: Place 1 application into both eyes See admin instructions. APPLY TO STYE FOUR TIMES DAILY AS NEEDED FOR EYE STYE 07/07/17   Vicie Mutters, PA-C  nitroGLYCERIN (NITROSTAT) 0.4 MG SL tablet Place 1 tablet (0.4 mg total) under the tongue every 5 (five) minutes as needed for chest pain. 09/04/14   Larey Dresser, MD  ondansetron (ZOFRAN-ODT) 8 MG disintegrating tablet DISSOLVE ONE TABLET IN MOUTH EVERY 8 HOURS AS NEEDED FOR NAUSEA AND  VOMITING 10/05/17   [name], NP  OVER THE COUNTER MEDICATION Take 1,000 mg by mouth 2 (two) times daily. [place] Calcium 1000mg     [provider]  oxyCODONE-acetaminophen (PERCOCET/ROXICET) 5-325 MG tablet Take 1 tablet by mouth every 4 (four) hours as needed for severe pain. 03/10/17   Tegeler, Gwenyth Allegra, MD  polyethylene glycol powder (GLYCOLAX/MIRALAX) powder Take 17 g by mouth 2 (two) times daily. 05/16/17   Armbruster, Carlota Raspberry, MD    pravastatin (PRAVACHOL) 40 MG tablet Take 1 tablet (40 mg total) by mouth daily. 07/31/17   [name], MD  promethazine (PHENERGAN) 25 MG tablet Take 1 tablet (25 mg total) by mouth every 6 (six) hours as needed for nausea or vomiting. 09/04/17   [name], MD  senna-docusate (SENOKOT-S) 8.6-50 MG tablet Take 2 tablets by mouth 2 (two) times daily. 12/26/15   Roxan Hockey, MD  silodosin (RAPAFLO) 8 MG CAPS capsule Take 8 mg by mouth daily with breakfast.     [provider]     Family History  Problem Relation Age of Onset  . Colon cancer Mother 33  . Heart disease Father 27       heart attack  . Brain cancer Brother        tumor   . Heart disease Brother   . Heart disease Brother   .  Kidney disease Daughter     Social History   Socioeconomic History  . Marital status: Married    Spouse name: [name]  . Number of children: 2  . Years of education: 61  . Highest education level: Not on file  Occupational History  . Occupation: Retired    Fish farm manager: RETIRED    Comment: worked at [employer]  . Financial resource strain: Not on file  . Food insecurity:    Worry: Not on file    Inability: Not on file  . Transportation needs:    Medical: Not on file    Non-medical: Not on file  Tobacco Use  . Smoking status: Never Smoker  . Smokeless tobacco: Never Used  Substance and Sexual Activity  . Alcohol use: No  . Drug use: No  . Sexual activity: Never  Lifestyle  . Physical activity:    Days per week: Not on file    Minutes per session: Not on file  . Stress: Not on file  Relationships  . Social connections:    Talks on phone: Not on file    Gets together: Not on file    Attends religious service: Not on file    Active member of club or organization: Not on file    Attends meetings of clubs or organizations: Not on file    Relationship status: Not on file  Other Topics Concern  . Not on file  Social History Narrative   Lives  with husband.   Right-handed.   3 cups caffeine per day.     Review of Systems: A 12 point ROS discussed and pertinent positives are indicated in the HPI above.  All other systems are negative.  Review of Systems  Constitutional: Negative for chills and fever.  Respiratory: Negative for shortness of breath and wheezing.   Cardiovascular: Negative for chest pain and palpitations.  Gastrointestinal:       Negative for bowel incontinence.  Genitourinary:       Negative for bladder incontinence.  Musculoskeletal: Positive for back pain.  Neurological: Negative for numbness.  Psychiatric/Behavioral: Negative for behavioral problems and confusion.    Vital Signs: BP (!) 182/78 (BP Location: Right Arm)   Pulse 66   Temp 97.9 F (36.6 C) (Oral)   Ht 5\' 2"  (1.575 m)   Wt 162 lb (73.5 kg)   SpO2 99%   BMI 29.63 kg/m   Physical Exam  Constitutional: She is oriented to person, place, and time. She appears well-developed and well-nourished. No distress.  Cardiovascular: Normal rate, regular rhythm and normal heart sounds.  No murmur heard. Pulmonary/Chest: Effort normal and breath sounds normal. No respiratory distress. She has no wheezes.  Musculoskeletal:  Moderate-severe mid to lower back pain at the approximate level of thoracic 11.  Neurological: She is alert and oriented to person, place, and time.  Skin: Skin is warm and dry.  Psychiatric: She has a normal mood and affect. Her behavior is normal. Judgment and thought content normal.  Nursing note and vitals reviewed.    MD Evaluation Airway: WNL Heart: WNL Abdomen: WNL Chest/ Lungs: WNL ASA  Classification: 3 Mallampati/Airway Score: Two   Imaging: Mr Lumbar Spine Wo Contrast  Result Date: 10/10/2017 CLINICAL DATA:  Initial evaluation for acute mid and right lower back pain extending into the right lower extremity. Status post recent kyphoplasty. EXAM: MRI LUMBAR SPINE AND SACRUM WITHOUT CONTRAST TECHNIQUE:  Multiplanar, multisequence MR imaging of the lumbar spine and sacrum was  performed. No intravenous contrast was administered. COMPARISON:  Comparison made with recent MRI from 09/26/2017. FINDINGS: MRI LUMBAR SPINE Segmentation: Normal segmentation. Lowest well-formed disc labeled the L5-S1 level. Same numbering system is used as on previous exams. Alignment: 5 mm anterolisthesis of L4 on L5, with trace retrolisthesis of L1 on L2, stable. Vertebral bodies otherwise normally aligned with preservation of the normal lumbar lordosis. Vertebrae: Multiple chronic compression deformities seen at the L1 through L4 levels, stable from previous. Sequelae of prior vertebral augmentation at L2, L3, and L4. There has been interval performance of vertebral augmentation at the T12 level for recently identified acute T12 compression fracture. Appearance is relatively stable without significant interval height loss or bony retropulsion. Persistent marrow edema within the treated T12 vertebral body. There is new linear T1 hypointensity with edema extending through the anterior aspect of the inferior endplate of K53 without significant height loss, suspicious for acute fracture (series 12, image 7). This does not extend through the entirety of the T11 vertebral body. No other evidence for acute or interval fracture. Bone marrow signal intensity within normal limits. No discrete or worrisome osseous lesions. Sequelae of prior posterior fusion at L1 through L3 noted, stable. Conus medullaris and cauda equina: Conus extends to the L1 level. Conus and cauda equina appear normal. Paraspinal and other soft tissues: Paraspinous soft tissues demonstrate no acute abnormality. Few small T2 hyperintense cyst noted within the left kidney. Visualized visceral structures otherwise unremarkable. Disc levels: T12-L1: Chronic 4 mm bony retropulsion related to the L1 compression fracture. Intervertebral ankylosis from bridging osteophyte. Flattening of  the ventral thecal sac with mild spinal stenosis. Foramina remain patent. L1-2: Mild diffuse disc bulge with facet hypertrophy. Previous posterior fusion. No stenosis. L2-3: Chronic intervertebral disc space narrowing with moderate facet hypertrophy. 4 mm retropulsion related to the chronic L3 compression fracture. Resultant mild to moderate spinal stenosis. Foramina not well assessed due to susceptibility artifact, but grossly patent. L3-4: Disc desiccation. Superimposed small right extraforaminal disc protrusion with annular fissure, contacting the exiting right L3 nerve root (series 16, image 23). Moderate facet and ligament flavum hypertrophy with resultant mild to moderate spinal stenosis. Foramina remain patent. L4-5: Anterolisthesis. Associated broad posterior disc bulge/uncovering. Advanced facet arthritis, left greater than right. Mild canal stenosis with bilateral L4 foraminal narrowing, stable. L5-S1: Trace retrolisthesis with intervertebral disc space narrowing and diffuse disc bulge. Mild endplate spurring. No significant stenosis. MRI SACRUM: Sacrum intact without evidence for acute or chronic fracture. Visualized bones of the pelvis intact. Partially visualized hips demonstrate no acute abnormality. SI joints approximated and symmetric without acute abnormality. Bone marrow signal intensity heterogeneous but within normal limits. No discrete or worrisome osseous lesions. Mild edema within the lower paraspinous musculature bilaterally, suggesting muscular strain (series 25, image 3). External soft tissues and musculature are otherwise unremarkable. Visualized portions of the bowel within normal limits. Bladder unremarkable. Uterus is absent. Ovaries within normal limits. No pelvic or adnexal mass. IMPRESSION: MRI LUMBAR SPINE IMPRESSION 1. Mild edema extending through the anterior aspect of the inferior endplate of Z76, consistent with acute endplate fracture, new relative to recent MRI from  09/26/2017. No associated height loss. 2. Sequelae of interval vertebral augmentation for recently identified acute T12 fracture. Stable height loss with persistent marrow edema. 3. Chronic compression fractures at L1 through L4 with sequelae of prior vertebral augmentation, stable. 4. Multilevel degenerative changes as above, stable from previous. MRI SACRUM IMPRESSION 1. Mild intramuscular edema within the lower posterior paraspinous musculature, suggesting muscular strain.  2. Otherwise negative MRI of the sacrum. No acute fracture or other acute abnormality. Electronically Signed   By: Jeannine Boga M.D.   On: 10/10/2017 20:42   Mr Sacrum Si Joints Wo Contrast  Result Date: 10/10/2017 CLINICAL DATA:  Initial evaluation for acute mid and right lower back pain extending into the right lower extremity. Status post recent kyphoplasty. EXAM: MRI LUMBAR SPINE AND SACRUM WITHOUT CONTRAST TECHNIQUE: Multiplanar, multisequence MR imaging of the lumbar spine and sacrum was performed. No intravenous contrast was administered. COMPARISON:  Comparison made with recent MRI from 09/26/2017. FINDINGS: MRI LUMBAR SPINE Segmentation: Normal segmentation. Lowest well-formed disc labeled the L5-S1 level. Same numbering system is used as on previous exams. Alignment: 5 mm anterolisthesis of L4 on L5, with trace retrolisthesis of L1 on L2, stable. Vertebral bodies otherwise normally aligned with preservation of the normal lumbar lordosis. Vertebrae: Multiple chronic compression deformities seen at the L1 through L4 levels, stable from previous. Sequelae of prior vertebral augmentation at L2, L3, and L4. There has been interval performance of vertebral augmentation at the T12 level for recently identified acute T12 compression fracture. Appearance is relatively stable without significant interval height loss or bony retropulsion. Persistent marrow edema within the treated T12 vertebral body. There is new linear T1  hypointensity with edema extending through the anterior aspect of the inferior endplate of N98 without significant height loss, suspicious for acute fracture (series 12, image 7). This does not extend through the entirety of the T11 vertebral body. No other evidence for acute or interval fracture. Bone marrow signal intensity within normal limits. No discrete or worrisome osseous lesions. Sequelae of prior posterior fusion at L1 through L3 noted, stable. Conus medullaris and cauda equina: Conus extends to the L1 level. Conus and cauda equina appear normal. Paraspinal and other soft tissues: Paraspinous soft tissues demonstrate no acute abnormality. Few small T2 hyperintense cyst noted within the left kidney. Visualized visceral structures otherwise unremarkable. Disc levels: T12-L1: Chronic 4 mm bony retropulsion related to the L1 compression fracture. Intervertebral ankylosis from bridging osteophyte. Flattening of the ventral thecal sac with mild spinal stenosis. Foramina remain patent. L1-2: Mild diffuse disc bulge with facet hypertrophy. Previous posterior fusion. No stenosis. L2-3: Chronic intervertebral disc space narrowing with moderate facet hypertrophy. 4 mm retropulsion related to the chronic L3 compression fracture. Resultant mild to moderate spinal stenosis. Foramina not well assessed due to susceptibility artifact, but grossly patent. L3-4: Disc desiccation. Superimposed small right extraforaminal disc protrusion with annular fissure, contacting the exiting right L3 nerve root (series 16, image 23). Moderate facet and ligament flavum hypertrophy with resultant mild to moderate spinal stenosis. Foramina remain patent. L4-5: Anterolisthesis. Associated broad posterior disc bulge/uncovering. Advanced facet arthritis, left greater than right. Mild canal stenosis with bilateral L4 foraminal narrowing, stable. L5-S1: Trace retrolisthesis with intervertebral disc space narrowing and diffuse disc bulge. Mild  endplate spurring. No significant stenosis. MRI SACRUM: Sacrum intact without evidence for acute or chronic fracture. Visualized bones of the pelvis intact. Partially visualized hips demonstrate no acute abnormality. SI joints approximated and symmetric without acute abnormality. Bone marrow signal intensity heterogeneous but within normal limits. No discrete or worrisome osseous lesions. Mild edema within the lower paraspinous musculature bilaterally, suggesting muscular strain (series 25, image 3). External soft tissues and musculature are otherwise unremarkable. Visualized portions of the bowel within normal limits. Bladder unremarkable. Uterus is absent. Ovaries within normal limits. No pelvic or adnexal mass. IMPRESSION: MRI LUMBAR SPINE IMPRESSION 1. Mild edema extending through the  anterior aspect of the inferior endplate of B14, consistent with acute endplate fracture, new relative to recent MRI from 09/26/2017. No associated height loss. 2. Sequelae of interval vertebral augmentation for recently identified acute T12 fracture. Stable height loss with persistent marrow edema. 3. Chronic compression fractures at L1 through L4 with sequelae of prior vertebral augmentation, stable. 4. Multilevel degenerative changes as above, stable from previous. MRI SACRUM IMPRESSION 1. Mild intramuscular edema within the lower posterior paraspinous musculature, suggesting muscular strain. 2. Otherwise negative MRI of the sacrum. No acute fracture or other acute abnormality. Electronically Signed   By: Jeannine Boga M.D.   On: 10/10/2017 20:42   Dg Ugi W/high Density W/kub  Result Date: 11/01/2017 CLINICAL DATA:  Nausea and dysphagia with solids sticking. Hiatal hernia repair and Nissen fundoplication in 7829. EXAM: UPPER GI SERIES WITH KUB TECHNIQUE: After obtaining a scout radiograph a routine upper GI series was performed using thin and high density barium. FLUOROSCOPY TIME:  Fluoroscopy Time:  4 minutes, 42  seconds Radiation Exposure Index (if provided by the fluoroscopic device): 66.2 mGy Number of Acquired Spot Images: 3 COMPARISON:  Multiple exams, including CT scan from 04/05/2017 FINDINGS: The patient has severe back pain related to multiple compression fractures, including subacute compression fractures at T11 and T12 seen on an MRI earlier this month, with multiple lumbar vertebral augmentations, and posterolateral rod and pedicle screw fixation at L1-L2-L3 bilaterally. Because of the patient's back pain and generalized pain, she was not able to turn on the table for gravity maneuvering purposes of the barium column, and only had very limited mobility. On the table she was able to assume supine and LPO positions. Because the limitations imposed by her lack of mobility and pain, I modified my standard protocol. This results in some reduction in diagnostic sensitivity and specificity, and also increased the fluoroscopy time to compensate for the lack of projections. The initial KUB demonstrates postoperative findings in the lumbar spine. Prominent stool throughout the colon favors constipation. Linear opacities at the left lung base favor subsegmental atelectasis or scarring. There is some mild blunting of the left lateral costophrenic angle. On the pharyngeal phase of swallowing there is a small Zenker's diverticulum and some minimal vallecular stasis. The Nissen fundoplication wrap extends just above the diaphragm compatible with small hiatal hernia. There is narrowing of the contrast column in the vicinity of the wrap down to about 8 mm as shown on image 24/12. I was not able to distend the lumen beyond 8 mm in the vicinity of the wrap. A 13 mm barium tablet impacted just above the level of the wrap and did not proceed down into the stomach. Continuous primary peristaltic waves in the esophagus were observed on 3/4 swallowing attempts. Occasional tertiary and secondary contractions noted. The stomach and  duodenum appear grossly normal. No obvious ulceration although sensitivity is reduced by patient mobility and pain. IMPRESSION: 1. Small hiatal hernia with fundoplication wrap extending above the diaphragm. In the vicinity of the wrap, the lumen could only be distended to about 8 mm in diameter, and a 13 mm barium tablet impacted just above the level of the wrap. It is possible that herniation of the wrap into the chest has cause some tethering which narrows the surrounded lumen. The narrowing in this vicinity is smooth. 2. Small Zenker's diverticulum. 3.  Prominent stool throughout the colon favors constipation. 4. Multiple compression fractures and kyphoplasties. The patient has subacute compression fractures at T11 and T12, and associated pain  limited the patient's ability to turn as described above. Electronically Signed   By: Van Clines M.D.   On: 11/01/2017 10:13   Ct Angio Chest/abd/pel For Dissection W And/or W/wo  Result Date: 10/10/2017 CLINICAL DATA:  Chest and abdominal/back pain EXAM: CT ANGIOGRAPHY CHEST, ABDOMEN AND PELVIS TECHNIQUE: Initially, axial CT images were obtained through the chest without intravenous contrast material administration. Multidetector CT imaging through the chest, abdomen and pelvis was performed using the standard protocol during bolus administration of intravenous contrast. Multiplanar reconstructed images and MIPs were obtained and reviewed to evaluate the vascular anatomy. CONTRAST:  150mL ISOVUE-370 IOPAMIDOL (ISOVUE-370) INJECTION 76%, <See Chart> ISOVUE-370 IOPAMIDOL (ISOVUE-370) INJECTION 76% COMPARISON:  CT angiogram abdomen and pelvis April 05, 2017 FINDINGS: CTA CHEST FINDINGS Cardiovascular: On the noncontrast images of the chest, there is no evident intramural hematoma. There is no appreciable thoracic aortic aneurysm or dissection. Visualized great vessels appear somewhat tortuous with do not show appreciable obstructive disease. A few small foci of  calcification in the visualized great vessels are noted. There is atherosclerotic calcification in the aorta. There are foci of coronary artery calcification. No pulmonary embolus is demonstrable. There is no appreciable pericardial effusion or pericardial thickening evident. Mediastinum/Nodes: Visualized thyroid appears unremarkable. There is no appreciable thoracic adenopathy. There is a focal hiatal hernia. There are postoperative changes suggesting previous Nissen fundoplication procedure. Lungs/Pleura: There is slight bibasilar atelectasis. No lung edema or consolidation. No pleural effusion or pleural thickening evident. Musculoskeletal: The patient has had previous kyphoplasty procedure at T12. Bones appear osteoporotic. No blastic or lytic bone lesions are evident. No chest wall lesions evident. Review of the MIP images confirms the above findings. CTA ABDOMEN AND PELVIS FINDINGS VASCULAR Aorta: No abdominal aortic aneurysm or dissection evident. There are foci of aortic atherosclerosis throughout the aorta, similar to prior study. Celiac: Atherosclerotic calcification is noted at the origin of the celiac axis without hemodynamically significant obstruction. Celiac artery and its major branches appear patent. No aneurysm or dissection. SMA: Superior mesenteric artery and its branches appear widely patent. No aneurysm or dissection. Replaced right hepatic artery arising from the right proximal superior mesenteric artery. Renals: There is a single renal artery on the left. There is atherosclerotic calcification at the origin of the left renal artery with less than 50% diameter obstruction in this area. There is a main right renal artery with accessory right renal artery with both vessels patent. No aneurysm or dissection involving renal arteries. No fibromuscular dysplasia evident. No vasculitis evident. IMA: Inferior mesenteric artery and its branches appear patent. No aneurysm or dissection. Inflow: There  are scattered foci of calcification in each mid the distal common iliac artery. There is calcification in both internal iliac arteries without hemodynamically significant obstruction. There is mild calcification in each distal common femoral artery. No aneurysm or dissection involving major pelvic arterial vessels. Veins: No obvious venous abnormality within the limitations of this arterial phase study. Review of the MIP images confirms the above findings. NON-VASCULAR Hepatobiliary: There is a stable 1 cm cyst in the anterior segment right lobe of the liver near the falciform ligament. No other focal liver lesions are evident. The gallbladder is absent. There is no appreciable intrahepatic biliary duct dilatation. The common bile duct measures 12 mm without biliary duct mass or calculus. Pancreas: No pancreatic mass or inflammatory focus. Spleen: No splenic lesions are evident. Adrenals/Urinary Tract: Adrenals appear unremarkable bilaterally. Kidneys bilaterally show no evident mass or hydronephrosis on either side. There is no evident  renal or ureteral calculus on either side. Urinary bladder is midline with wall thickness within normal limits. Stomach/Bowel: There is a focal hiatal hernia with evidence of previous Nissen fundoplication. There is no appreciable bowel wall or mesenteric thickening. No evident bowel obstruction. There are scattered colonic diverticula, most notably in the sigmoid region, without diverticulitis. Lymphatic: No demonstrable adenopathy in the abdomen or pelvis. Reproductive: Uterus absent.  No evident pelvic mass. Other: Appendix absent. No abscess or ascites evident in the abdomen or pelvis. There is a rather minimal ventral hernia containing only fat. Musculoskeletal: There is postoperative change at multiple levels in the lumbar region. The patient has undergone previous kyphoplasty procedures at L2, L3, L4. There is anterior wedging at T12, L1, L2, L3, and L4. There are no blastic  or lytic bone lesions. There is degenerative change in each hip joint. Bones are osteoporotic. There is no intramuscular or abdominal wall lesion evident. Review of the MIP images confirms the above findings. IMPRESSION: CT angiogram chest: 1. No thoracic aortic aneurysm or dissection. No evident pulmonary embolus. There is thoracic aortic atherosclerosis. There are foci of coronary artery calcification. 2.  No edema or consolidation.  Slight bibasilar lung atelectasis. 3.  No demonstrable thoracic adenopathy. 4. Focal hiatal hernia with evidence of previous Nissen fundoplication procedure. 5.  Prior kyphoplasty at T12. CT angiogram abdomen; CT angiogram pelvis: 1. Stable areas of atherosclerosis in the aorta and major mesenteric and pelvic arterial vessels without hemodynamically significant obstruction. No aneurysm or dissection in the abdominal aorta or major pelvic/mesenteric arterial vessels. 2. No bowel obstruction. No abscess. Appendix absent. There are colonic diverticula without diverticulitis. 3. Gallbladder absent. Prominence of the common bile duct without mass or calculus evident. 4. Prior for Nissen fundoplication procedure with hiatal hernia. Minimal ventral hernia containing only fat. 5. Multilevel postoperative change in kyphoplasty procedures noted in the lumbar region. Multiple wedge compression fractures in the lumbar spine, a finding noted previously. 6.  No renal or ureteral calculus.  No hydronephrosis. 7.  Uterus absent. Aortic Atherosclerosis (ICD10-I70.0). Electronically Signed   By: Lowella Grip III M.D.   On: 10/10/2017 11:27   Xr Lumbar Spine 2-3 Views  Result Date: 10/26/2017 Mild compression deformity T11, kyphoplasty T12, L2, L3 and L4, Anterolishesis L4-5 grade 1-2. Fusion L1 to L3 with Pedicle screws and rods. The height and alignment through the fusion segment is well maintained.    Labs:  CBC: Recent Labs    09/25/17 1843 09/26/17 0559 09/27/17 0158  10/10/17 0729  WBC 11.8* 7.7 8.4 9.6  HGB 15.2* 13.5 13.9 15.3*  HCT 46.4* 41.2 42.9 46.5*  PLT 285 251 262 475*    COAGS: Recent Labs    09/04/17 1031 09/27/17 1132  INR 0.9 0.92    BMP: Recent Labs    07/25/17 0952 09/25/17 1843 09/26/17 0559 09/27/17 0158 10/10/17 0729  NA 141 141  --  140 140  K 4.4 3.8  --  4.1 4.1  CL 106 103  --  106 102  CO2 27 24  --  22 27  GLUCOSE 112* 92  --  104* 130*  BUN 17 13  --  12 13  CALCIUM 10.2 9.7  --  9.0 9.7  CREATININE 0.98* 0.94 0.86 0.93 0.90  GFRNONAA 55* 56* >60 57* 59*  GFRAA 64 >60 >60 >60 >60    LIVER FUNCTION TESTS: Recent Labs    03/10/17 0810 03/23/17 1120 04/12/17 1153 07/25/17 0952 10/10/17 0729 10/26/17 1158  BILITOT  1.5* 0.7 0.7 0.9 0.6  --   AST 28 15 20 23 21   --   ALT 20 18 17  30* 33  --   ALKPHOS 61  --   --   --  116  --   PROT 8.5* 7.1 6.9 6.9 7.7 7.2  ALBUMIN 4.9  --   --   --  4.0  --     TUMOR MARKERS: No results for input(s): AFPTM, CEA, CA199, CHROMGRNA in the last 8760 hours.  Assessment and Plan:  Thoracic 11 compression fracture. Plan for image-guided thoracic 11 kyphoplasty/vertebroplasty today with Dr. Estanislado Pandy. Patient is NPO. Denies fever. She does not take blood thinners. INR pending.  Risks and benefits of thoracic 11 kyphoplasty/vertebroplasty were discussed with the patient including, but not limited to education regarding the natural healing process of compression fractures without intervention, bleeding, infection, cement migration which may cause spinal cord damage, paralysis, pulmonary embolism or even death. This interventional procedure involves the use of X-rays and because of the nature of the planned procedure, it is possible that we will have prolonged use of X-ray fluoroscopy. Potential radiation risks to you include (but are not limited to) the following: - A slightly elevated risk for cancer  several years later in life. This risk is typically less than 0.5%  percent. This risk is low in comparison to the normal incidence of human cancer, which is 33% for women and 50% for men according to the [employer]. - Radiation induced injury can include skin redness, resembling a rash, tissue breakdown / ulcers and hair loss (which can be temporary or permanent).  The likelihood of either of these occurring depends on the difficulty of the procedure and whether you are sensitive to radiation due to previous procedures, disease, or genetic conditions.  IF your procedure requires a prolonged use of radiation, you will be notified and given written instructions for further action.  It is your responsibility to monitor the irradiated area for the 2 weeks following the procedure and to notify your physician if you are concerned that you have suffered a radiation induced injury.   All of the patient's questions were answered, patient is agreeable to proceed. Consent signed and in chart.   Thank you for this interesting consult.  I greatly enjoyed meeting [name] [name] and look forward to participating in their care.  A copy of this report was sent to the requesting provider on this date.  Electronically Signed: Earley Abide, PA-C 11/03/2017, 10:46 AM   I spent a total of 25 Minutes in face to face in clinical consultation, greater than 50% of which was counseling/coordinating care for thoracic 11 compression fracture.

## 2017-11-03 NOTE — Discharge Instructions (Signed)
KYPHOPLASTY/VERTEBROPLASTY DISCHARGE INSTRUCTIONS  Medications: (check all that apply)     Resume all home medications as before procedure.     .                  Continue your pain medications as prescribed as needed.  Over the next 3-5 days, decrease your pain medication as tolerated.  Over the counter medications (i.e. Tylenol, ibuprofen, and aleve) may be substituted once severe/moderate pain symptoms have subsided.   Wound Care: - Bandages may be removed the day following your procedure.  You may get your incision wet once bandages are removed.  Bandaids may be used to cover the incisions until scab formation.  Topical ointments are optional.  - If you develop a fever greater than 101 degrees, have increased skin redness at the incision sites or pus-like oozing from incisions occurring within 1 week of the procedure, contact radiology at [phone] or [phone].  - Ice pack to back for 15-20 minutes 2-3 time per day for first 2-3 days post procedure.  The ice will expedite muscle healing and help with the pain from the incisions.   Activity: - Bedrest today with limited activity for 24 hours post procedure.  - No driving for 48 hours.  - Increase your activity as tolerated after bedrest (with assistance if necessary).  - Refrain from any strenuous activity or heavy lifting (greater than 10 lbs.).   Follow up: - Contact radiology at [phone] or [phone] if any questions/concerns.  - A physician assistant from radiology will contact you in approximately 1 week.  - If a biopsy was performed at the time of your procedure, your referring physician should receive the results in usually 2-3 days.        1.No stooping,bending or lifting more than 10 lbs for 2 weeks. 2.Use walker to ambulate for 2 weeks. 3.See PRN 2 weeks

## 2017-11-03 NOTE — Procedures (Signed)
S/P T 11 balloon KP 

## 2017-11-06 ENCOUNTER — Emergency Department (HOSPITAL_COMMUNITY): Payer: PPO

## 2017-11-06 ENCOUNTER — Other Ambulatory Visit: Payer: Self-pay

## 2017-11-06 ENCOUNTER — Ambulatory Visit: Payer: PPO | Admitting: Cardiology

## 2017-11-06 ENCOUNTER — Encounter (HOSPITAL_COMMUNITY): Payer: Self-pay | Admitting: Interventional Radiology

## 2017-11-06 ENCOUNTER — Telehealth (HOSPITAL_COMMUNITY): Payer: Self-pay

## 2017-11-06 ENCOUNTER — Emergency Department (HOSPITAL_COMMUNITY)
Admission: EM | Admit: 2017-11-06 | Discharge: 2017-11-07 | Disposition: A | Discharge status: Home / Self Care | Payer: PPO | Attending: Emergency Medicine | Admitting: Emergency Medicine

## 2017-11-06 DIAGNOSIS — J45909 Unspecified asthma, uncomplicated: Secondary | ICD-10-CM | POA: Insufficient documentation

## 2017-11-06 DIAGNOSIS — I2510 Atherosclerotic heart disease of native coronary artery without angina pectoris: Secondary | ICD-10-CM | POA: Insufficient documentation

## 2017-11-06 DIAGNOSIS — N183 Chronic kidney disease, stage 3 (moderate): Secondary | ICD-10-CM | POA: Insufficient documentation

## 2017-11-06 DIAGNOSIS — G8929 Other chronic pain: Secondary | ICD-10-CM | POA: Diagnosis not present

## 2017-11-06 DIAGNOSIS — M546 Pain in thoracic spine: Secondary | ICD-10-CM | POA: Diagnosis not present

## 2017-11-06 DIAGNOSIS — I129 Hypertensive chronic kidney disease with stage 1 through stage 4 chronic kidney disease, or unspecified chronic kidney disease: Secondary | ICD-10-CM | POA: Insufficient documentation

## 2017-11-06 DIAGNOSIS — M545 Low back pain: Secondary | ICD-10-CM | POA: Diagnosis not present

## 2017-11-06 DIAGNOSIS — Z79899 Other long term (current) drug therapy: Secondary | ICD-10-CM | POA: Diagnosis not present

## 2017-11-06 DIAGNOSIS — M549 Dorsalgia, unspecified: Secondary | ICD-10-CM | POA: Diagnosis not present

## 2017-11-06 DIAGNOSIS — M5441 Lumbago with sciatica, right side: Secondary | ICD-10-CM | POA: Diagnosis not present

## 2017-11-06 LAB — BASIC METABOLIC PANEL
Anion gap: 11 (ref 5–15)
BUN: 12 mg/dL (ref 8–23)
CHLORIDE: 105 mmol/L (ref 98–111)
CO2: 23 mmol/L (ref 22–32)
CREATININE: 0.87 mg/dL (ref 0.44–1.00)
Calcium: 9.6 mg/dL (ref 8.9–10.3)
GFR calc Af Amer: >60 mL/min (ref >60)
GFR calc non Af Amer: >60 mL/min (ref >60)
GLUCOSE: 97 mg/dL (ref 70–99)
Potassium: 3.8 mmol/L (ref 3.5–5.1)
SODIUM: 139 mmol/L (ref 135–145)

## 2017-11-06 MED ORDER — KETOROLAC TROMETHAMINE 15 MG/ML IJ SOLN
15.0000 mg | Freq: Once | INTRAMUSCULAR | Status: AC
  Administered 2017-11-06: 15 mg via INTRAVENOUS
  Filled 2017-11-06: qty 1

## 2017-11-06 MED ORDER — ONDANSETRON HCL 4 MG/2ML IJ SOLN
4.0000 mg | Freq: Once | INTRAMUSCULAR | Status: AC
  Administered 2017-11-06: 4 mg via INTRAVENOUS
  Filled 2017-11-06: qty 2

## 2017-11-06 MED ORDER — HYDROMORPHONE HCL 1 MG/ML IJ SOLN
1.0000 mg | Freq: Once | INTRAMUSCULAR | Status: AC
  Administered 2017-11-06: 1 mg via INTRAVENOUS
  Filled 2017-11-06: qty 1

## 2017-11-06 MED ORDER — LORAZEPAM 1 MG PO TABS
1.0000 mg | ORAL_TABLET | Freq: Once | ORAL | Status: AC
Start: 2017-11-06 — End: 2017-11-06
  Administered 2017-11-06: 1 mg via ORAL
  Filled 2017-11-06: qty 1

## 2017-11-06 MED ORDER — PREDNISONE 20 MG PO TABS
40.0000 mg | ORAL_TABLET | Freq: Once | ORAL | Status: AC
  Administered 2017-11-06: 40 mg via ORAL
  Filled 2017-11-06: qty 2

## 2017-11-06 NOTE — Progress Notes (Signed)
Office Visit Note  Patient: [name] [name]             Date of Birth: 08/03/1935           MRN: [id number]             PCP: [name], MD Referring: [name], MD Visit Date: 11/14/2017 Occupation: '@[email or web address]' @    Subjective:  Discuss medication options   History of Present Illness: [name] [name] is a 82 y.o. female with history of osteoporosis, DDD, and osteoarthritis.  Patient was previously taking Fosamax 70 mg by mouth once weekly.  She was experiencing nausea with Fosamax and discontinued.  Her insurance approved Tymlos but she was skeptical about side effects and did not want to start at that time.  She reports she continues to take calcium and vitamin D on a daily basis.  She states that she did a kyphoplasty performed on 11/03/2017 and is still having some residual discomfort in her lower back.  She states that gradually the pain is been improving.  Activities of Daily Living:  Patient reports morning stiffness for 0 minutes.   Patient Reports nocturnal pain.  Difficulty dressing/grooming: Denies Difficulty climbing stairs: Denies Difficulty getting out of chair: Reports Difficulty using hands for taps, buttons, cutlery, and/or writing: Denies   Review of Systems  Constitutional: Positive for fatigue.  HENT: Negative for mouth sores, mouth dryness and nose dryness.   Eyes: Negative for pain, visual disturbance and dryness.  Respiratory: Negative for cough, hemoptysis, shortness of breath and difficulty breathing.   Cardiovascular: Negative for chest pain, palpitations, hypertension and swelling in legs/feet.  Gastrointestinal: Positive for constipation. Negative for blood in stool and diarrhea.  Endocrine: Negative for increased urination.  Genitourinary: Negative for painful urination and pelvic pain.  Musculoskeletal: Positive for arthralgias, joint pain and joint swelling. Negative for myalgias, muscle weakness, morning stiffness, muscle tenderness and  myalgias.  Skin: Negative for color change, pallor, rash, hair loss, nodules/bumps, skin tightness, ulcers and sensitivity to sunlight.  Allergic/Immunologic: Negative for susceptible to infections.  Neurological: Negative for dizziness, light-headedness, numbness, headaches, memory loss and weakness.  Hematological: Negative for swollen glands.  Psychiatric/Behavioral: Negative for depressed mood, confusion and sleep disturbance. The patient is not nervous/anxious.     PMFS History:  Patient Active Problem List   Diagnosis Date Noted  . Intractable nausea and vomiting 11/08/2017  . CKD (chronic kidney disease), stage III ([place]) 09/27/2017  . Low back pain 09/26/2017  . T12 compression fracture ([place]) 09/26/2017  . Chronic bronchitis ([place]) 08/09/2017  . Atherosclerosis of aorta ([place]) 04/05/2017  . Costochondritis 11/14/2016  . Anxiety state 06/08/2016  . History of lumbar spinal fusion 04/13/2016  . Spondylolisthesis, lumbar region 04/13/2016  . Leukocytosis 03/28/2016  . Dysphagia   . Lumbar radiculopathy, acute 12/07/2015    Class: Chronic  . Spinal stenosis, lumbar region, with neurogenic claudication 05/29/2015  . DDD (degenerative disc disease), lumbar 04/21/2015  . Mixed hyperlipidemia 11/24/2014  . GERD  11/24/2014  . Depression, major, in partial remission ([place]) 09/26/2014  . Chronic pain syndrome 09/26/2014  . Coronary artery disease due to lipid rich plaque   . Osteoporosis 02/10/2014  . Prediabetes 08/14/2013  . Vitamin D deficiency 08/14/2013  . Medication management 08/14/2013  . Constipation, chronic 04/03/2013  . MGUS (monoclonal gammopathy of unknown significance) 03/05/2013  . Herniated lumbar intervertebral disc 06/25/2012    Class: Acute  . Vertebral compression fracture ([place]) 06/23/2012  . ESOPHAGEAL STRICTURE 08/28/2008  .  Mild intermittent asthma without complication 88/82/8003  . Incarcerated recurrent paraesophageal hiatal hernia s/p lap redo repair  [id number] 01/31/2008  . Essential hypertension 07/25/2007    Past Medical History:  Diagnosis Date  . Anxiety    takes Xanax daily as needed  . Asthma    Albuterol daily as needed  . Blood transfusion without reported diagnosis   . CAD (coronary artery disease)    LHC 2003 with 40% pLAD, 30% mLAD, 100% D1 (moderate vessel), 100%  D2 (small vessel), 95% mid small OM2.  Pt had PTCA to D1;  D2 and OM2 small vessels and tx medically;  Last Myoview (2012): anterior infarct seen with scar, no ischemia. EF normal. Pt managed medically;  Lexiscan Myoview (12/14):  Low risk; ant scar with very mild peri-infarct ischemia; EF 55% with ant AK   . Cataract   . Chronic back pain    HNP, DDD, spinal stenosis, vertebral compression fractures.   . Chronic nausea    takes Zofran daily as needed.  normal gastric emptying study 03/2013  . CKD (chronic kidney disease), stage III ([place]) 09/27/2017  . Constipation    felt to be functional. takes Senokot and Miralax daily.  treated with Linzess in past.   . DIVERTICULOSIS, COLON 08/28/2008   Qualifier: Diagnosis of  By: [name], [name], [name]    . Elevated LFTs 2015   probably med induced DILI, from Augmentin vs Pravastatin  . GERD (gastroesophageal reflux disease)    hx of esophageal stricture   . Hiatal hernia    Paraesophageal hernias. s/p fundoplications in 0569 and 04/2013  . History of bronchitis several of yrs ago  . History of colon polyps 03/2009, 03/2011   adenomatous colon polyps, no high grade dysplasia.   [name] History of vertigo    no meds  . Hyperlipidemia    takes Pravastatin daily  . Hypertension    takes Amlodipine,Metoprolol,and Losartan daily  . Nausea 03/2016  . Osteoporosis   . PONV (postoperative nausea and vomiting)    yrs ago  . Slow urinary stream    takes Rapaflo daily  . TIA (transient ischemic attack) 1995   no residual effects noted    Family History  Problem Relation Age of Onset  . Colon cancer Mother 55  . Heart disease  Father 57       heart attack  . Brain cancer Brother        tumor   . Heart disease Brother   . Heart disease Brother   . Kidney disease Daughter    Past Surgical History:  Procedure Laterality Date  . APPENDECTOMY     patient unsure of date  . BACK SURGERY    . BLADDER REPAIR    . CHOLECYSTECTOMY     Patient unsure of date.  . CORONARY ANGIOPLASTY  11/16/2001   1 stent  . ESOPHAGEAL MANOMETRY N/A 03/25/2013   Procedure: ESOPHAGEAL MANOMETRY (EM);  Surgeon: Inda Castle, MD;  Location: [facility];  Service: Endoscopy;  Laterality: N/A;  . ESOPHAGOGASTRODUODENOSCOPY N/A 03/15/2013   Procedure: ESOPHAGOGASTRODUODENOSCOPY (EGD);  Surgeon: Jerene Bears, MD;  Location: [facility] ENDOSCOPY;  Service: Endoscopy;  Laterality: N/A;  . ESOPHAGOGASTRODUODENOSCOPY N/A 12/25/2015   Procedure: ESOPHAGOGASTRODUODENOSCOPY (EGD);  Surgeon: Doran Stabler, MD;  Location[facility] ENDOSCOPY;  Service: Endoscopy;  Laterality: N/A;  . EYE SURGERY  11/2000   bilateral cataracts with lens implant  . FIXATION KYPHOPLASTY LUMBAR SPINE  X 2   "L3-4"  . HEMORRHOID  SURGERY    . HIATAL HERNIA REPAIR  06/03/2011   Procedure: LAPAROSCOPIC REPAIR OF HIATAL HERNIA;  Surgeon: Adin Hector, MD;  Location: [facility] ORS;  Service: General;  Laterality: N/A;  . HIATAL HERNIA REPAIR N/A 04/24/2013   Procedure: LAPAROSCOPIC  REPAIR RECURRENT PARASOPHAGEAL HIATAL HERNIA WITH FUNDOPLICATION;  Surgeon: Adin Hector, MD;  Location: [facility];  Service: General;  Laterality: N/A;  . INSERTION OF MESH N/A 04/24/2013   Procedure: INSERTION OF MESH;  Surgeon: Adin Hector, MD;  Location: [facility];  Service: General;  Laterality: N/A;  . IR KYPHO THORACIC WITH BONE BIOPSY  09/29/2017  . IR KYPHO THORACIC WITH BONE BIOPSY  11/03/2017  . LAPAROSCOPIC LYSIS OF ADHESIONS N/A 04/24/2013   Procedure: LAPAROSCOPIC LYSIS OF ADHESIONS;  Surgeon: Adin Hector, MD;  Location: [facility];  Service: General;  Laterality: N/A;  . LAPAROSCOPIC NISSEN  FUNDOPLICATION  12/04/209   Procedure: LAPAROSCOPIC NISSEN FUNDOPLICATION;  Surgeon: Adin Hector, MD;  Location: [facility];  Service: General;  Laterality: N/A;  . LUMBAR FUSION  12/07/2015   Right L2-3 facetectomy, posterolateral fusion L2-3 with pedicle screws, rods, local bone graft, Vivigen cancellous chips. Fusion extended to the L1 level with pedicle screws and rods  . LUMBAR LAMINECTOMY/DECOMPRESSION MICRODISCECTOMY Right 06/26/2012   Procedure: LUMBAR LAMINECTOMY/DECOMPRESSION MICRODISCECTOMY;  Surgeon: Jessy Oto, MD;  Location: [facility];  Service: Orthopedics;  Laterality: Right;  RIGHT L4-5 MICRODISCECTOMY  . LUMBAR LAMINECTOMY/DECOMPRESSION MICRODISCECTOMY N/A 05/29/2015   Procedure: RIGHT L2-3 MICRODISCECTOMY;  Surgeon: Jessy Oto, MD;  Location: [place];  Service: Orthopedics;  Laterality: N/A;  . TOTAL ABDOMINAL HYSTERECTOMY  1975   partial   Social History   Social History Narrative   Lives with husband.   Right-handed.   3 cups caffeine per day.     Objective: Vital Signs: BP 134/77 (BP Location: Left Arm, Patient Position: Sitting, Cuff Size: Normal)   Pulse 63   Resp 17   Ht 5' 2.5" (1.588 m)   Wt 164 lb (74.4 kg)   BMI 29.52 kg/m    Physical Exam  Constitutional: She is oriented to person, place, and time. She appears well-developed and well-nourished.  HENT:  Head: Normocephalic and atraumatic.  Eyes: Conjunctivae and EOM are normal.  Neck: Normal range of motion.  Cardiovascular: Normal rate, regular rhythm, normal heart sounds and intact distal pulses.  Pulmonary/Chest: Effort normal and breath sounds normal.  Abdominal: Soft. Bowel sounds are normal.  Lymphadenopathy:    She has no cervical adenopathy.  Neurological: She is alert and oriented to person, place, and time.  Skin: Skin is warm and dry. Capillary refill takes less than 2 seconds.  Psychiatric: She has a normal mood and affect. Her behavior is normal.  Nursing note and vitals reviewed.      Musculoskeletal Exam: C-spine, thoracic spine, and lumbar spine good ROM.  Shoulder joints, elbow joints, wrist joints, MCPs, PIPs, and DIPs good ROM with no synovitis. PIP and DIP synovial thickening consistent with osteoarthritis. Hip joints, knee joints, ankle joints, MTPs, PIPs, and DIPs good ROM with no synovitis.  Mild PIP and DIP synovial thickening consistent with osteoarthritis of both feet.  No warmth or effusion of knee joints. Pitting edema in bilateral lower extremities.   CDAI Exam: No CDAI exam completed.    Investigation: No additional findings. CBC Latest Ref Rng & Units 11/11/2017 11/09/2017 11/08/2017  WBC 4.0 - 10.5 K/uL 5.9 7.0 8.2  Hemoglobin 12.0 - 15.0 g/dL 13.9  13.1 15.0  Hematocrit 36.0 - 46.0 % 41.8 40.7 47.0(H)  Platelets 150 - 400 K/uL 318 344 332   CMP Latest Ref Rng & Units 11/11/2017 11/10/2017 11/09/2017  Glucose 70 - 99 mg/dL 103(H) 94 96  BUN 8 - 23 mg/dL 6(L) 8 9  Creatinine 0.44 - 1.00 mg/dL 0.82 0.77 0.76  Sodium 135 - 145 mmol/L 142 140 138  Potassium 3.5 - 5.1 mmol/L 3.0(L) 3.5 3.7  Chloride 98 - 111 mmol/L 108 111 109  CO2 22 - 32 mmol/L '25 24 24  ' Calcium 8.9 - 10.3 mg/dL 9.0 8.3(L) 8.3(L)  Total Protein 6.5 - 8.1 g/dL - - -  Total Bilirubin 0.3 - 1.2 mg/dL - - -  Alkaline Phos 38 - 126 U/L - - -  AST 15 - 41 U/L - - -  ALT 0 - 44 U/L - - -    Imaging: Dg Thoracic Spine 2 View  Result Date: 11/06/2017 CLINICAL DATA:  Worsening right-sided back pain EXAM: THORACIC SPINE 2 VIEWS COMPARISON:  Chest x-ray 09/26/2017, MRI 12/13/2016, radiograph 12/06/2016 MRI 10/10/2017 FINDINGS: Minimal kyphosis at the thoracolumbar junction. Post augmentation changes at T11 and T12, findings at T11 are new since radiograph 10/26/2017. Partially visualized lumbar spine hardware. Remaining vertebral body heights demonstrate normal stature. IMPRESSION: Post augmentation changes at T11 and T12. Remaining thoracic vertebral bodies show no acute interval change compared to  prior radiograph Electronically Signed   By: Donavan Foil M.D.   On: 11/06/2017 19:04   Dg Lumbar Spine Complete  Result Date: 11/06/2017 CLINICAL DATA:  Back pain EXAM: LUMBAR SPINE - COMPLETE 4+ VIEW COMPARISON:  10/26/2017, 09/26/2017 and 10/10/2017 MRI FINDINGS: Surgical clips in the upper abdomen. Posterior stabilization rods and fixating screws L1 through L3. Multiple compression fractures L1 through L4 with post augmentation changes at L2-L3 and L4. Similar appearance of post augmentation changes at T12. New post augmentation changes at T11. Marked degenerative change at L5-S1. Small amount of radiopaque material in the distal colon. IMPRESSION: 1. Interval post augmentation changes at T11 since 10/26/2017 radiograph. Multiple chronic compression 6 L1 through L4 with postsurgical changes L1 through L3. 2. No definite acute interval change radiographically since most recent prior radiograph. Electronically Signed   By: Donavan Foil M.D.   On: 11/06/2017 19:08   Mr Thoracic Spine Wo Contrast  Result Date: 11/07/2017 CLINICAL DATA:  Right-sided thoracic back pain. Recent T11 vertebral augmentation. EXAM: MRI THORACIC AND LUMBAR SPINE WITHOUT CONTRAST TECHNIQUE: Multiplanar and multiecho pulse sequences of the thoracic and lumbar spine were obtained without intravenous contrast. COMPARISON:  Lumbar spine MRI 10/10/2017 FINDINGS: MRI THORACIC SPINE FINDINGS Alignment:  Physiologic. Vertebrae: There is compression deformity with less than 25% height loss at T11 and approximately 25% height loss at T12. Findings of recent vertebral augmentation. The appearance of T12 is unchanged compared to the 10/10/2017 MRI. The T11 augmentation is new compared to prior studies. There is diffuse marrow edema at both levels. Cord:  Normal signal and morphology. Paraspinal and other soft tissues: Mild edema in the posterior paraspinal soft tissues at T11 consistent with recent vertebral augmentation. Disc levels: No disc  herniation or stenosis. MRI LUMBAR SPINE FINDINGS Segmentation:  Standard. Alignment:  Grade 1 anterolisthesis at L4-L5. Vertebrae: L1-L3 PLIF. Chronic compression deformities at the L1-L4 levels are unchanged. There are findings compatible with multilevel vertebral augmentation, also unchanged. No new compression fracture. No discitis. Conus medullaris and cauda equina: Conus extends to the L1 level. Conus and cauda equina appear  normal. Paraspinal and other soft tissues: No retroperitoneal abnormality. Disc levels: L1-L2: Postfusion changes.  No stenosis. L2-L3: Postfusion changes. Small disc osteophyte complex and facet hypertrophy with mild narrowing of the spinal canal. Mild bilateral foraminal stenosis. L3-L4: Moderate facet hypertrophy with mild bilateral foraminal narrowing. No spinal canal stenosis. L4-L5: Grade 1 anterolisthesis. Posterior decompression. No spinal canal stenosis. Mild left foraminal stenosis. L5-S1: Disc space narrowing and endplate ridging. No spinal canal stenosis. Moderate bilateral neural foraminal stenosis, unchanged. IMPRESSION: MR THORACIC SPINE IMPRESSION 1. New T11 vertebral augmentation since the prior study. Diffuse marrow edema at T11 and T12, which may be posttraumatic or post procedural. No height loss progression at T12. 2. No thoracic spinal canal stenosis. MR LUMBAR SPINE IMPRESSION 1. Unchanged appearance of L1-L4 compression deformities with post vertebral augmentation changes. No new compression fracture. 2. Unchanged mild L2-3 spinal canal stenosis. 3. Unchanged multilevel mild-to-moderate foraminal stenosis, greatest at L5-S1. Electronically Signed   By: Ulyses Jarred M.D.   On: 11/07/2017 00:59   Mr Lumbar Spine Wo Contrast  Result Date: 11/07/2017 CLINICAL DATA:  Right-sided thoracic back pain. Recent T11 vertebral augmentation. EXAM: MRI THORACIC AND LUMBAR SPINE WITHOUT CONTRAST TECHNIQUE: Multiplanar and multiecho pulse sequences of the thoracic and lumbar  spine were obtained without intravenous contrast. COMPARISON:  Lumbar spine MRI 10/10/2017 FINDINGS: MRI THORACIC SPINE FINDINGS Alignment:  Physiologic. Vertebrae: There is compression deformity with less than 25% height loss at T11 and approximately 25% height loss at T12. Findings of recent vertebral augmentation. The appearance of T12 is unchanged compared to the 10/10/2017 MRI. The T11 augmentation is new compared to prior studies. There is diffuse marrow edema at both levels. Cord:  Normal signal and morphology. Paraspinal and other soft tissues: Mild edema in the posterior paraspinal soft tissues at T11 consistent with recent vertebral augmentation. Disc levels: No disc herniation or stenosis. MRI LUMBAR SPINE FINDINGS Segmentation:  Standard. Alignment:  Grade 1 anterolisthesis at L4-L5. Vertebrae: L1-L3 PLIF. Chronic compression deformities at the L1-L4 levels are unchanged. There are findings compatible with multilevel vertebral augmentation, also unchanged. No new compression fracture. No discitis. Conus medullaris and cauda equina: Conus extends to the L1 level. Conus and cauda equina appear normal. Paraspinal and other soft tissues: No retroperitoneal abnormality. Disc levels: L1-L2: Postfusion changes.  No stenosis. L2-L3: Postfusion changes. Small disc osteophyte complex and facet hypertrophy with mild narrowing of the spinal canal. Mild bilateral foraminal stenosis. L3-L4: Moderate facet hypertrophy with mild bilateral foraminal narrowing. No spinal canal stenosis. L4-L5: Grade 1 anterolisthesis. Posterior decompression. No spinal canal stenosis. Mild left foraminal stenosis. L5-S1: Disc space narrowing and endplate ridging. No spinal canal stenosis. Moderate bilateral neural foraminal stenosis, unchanged. IMPRESSION: MR THORACIC SPINE IMPRESSION 1. New T11 vertebral augmentation since the prior study. Diffuse marrow edema at T11 and T12, which may be posttraumatic or post procedural. No height loss  progression at T12. 2. No thoracic spinal canal stenosis. MR LUMBAR SPINE IMPRESSION 1. Unchanged appearance of L1-L4 compression deformities with post vertebral augmentation changes. No new compression fracture. 2. Unchanged mild L2-3 spinal canal stenosis. 3. Unchanged multilevel mild-to-moderate foraminal stenosis, greatest at L5-S1. Electronically Signed   By: Ulyses Jarred M.D.   On: 11/07/2017 00:59   Dg Ugi W/high Density W/kub  Result Date: 11/01/2017 CLINICAL DATA:  Nausea and dysphagia with solids sticking. Hiatal hernia repair and Nissen fundoplication in 3662. EXAM: UPPER GI SERIES WITH KUB TECHNIQUE: After obtaining a scout radiograph a routine upper GI series was performed using thin  and high density barium. FLUOROSCOPY TIME:  Fluoroscopy Time:  4 minutes, 42 seconds Radiation Exposure Index (if provided by the fluoroscopic device): 66.2 mGy Number of Acquired Spot Images: 3 COMPARISON:  Multiple exams, including CT scan from 04/05/2017 FINDINGS: The patient has severe back pain related to multiple compression fractures, including subacute compression fractures at T11 and T12 seen on an MRI earlier this month, with multiple lumbar vertebral augmentations, and posterolateral rod and pedicle screw fixation at L1-L2-L3 bilaterally. Because of the patient's back pain and generalized pain, she was not able to turn on the table for gravity maneuvering purposes of the barium column, and only had very limited mobility. On the table she was able to assume supine and LPO positions. Because the limitations imposed by her lack of mobility and pain, I modified my standard protocol. This results in some reduction in diagnostic sensitivity and specificity, and also increased the fluoroscopy time to compensate for the lack of projections. The initial KUB demonstrates postoperative findings in the lumbar spine. Prominent stool throughout the colon favors constipation. Linear opacities at the left lung base favor  subsegmental atelectasis or scarring. There is some mild blunting of the left lateral costophrenic angle. On the pharyngeal phase of swallowing there is a small Zenker's diverticulum and some minimal vallecular stasis. The Nissen fundoplication wrap extends just above the diaphragm compatible with small hiatal hernia. There is narrowing of the contrast column in the vicinity of the wrap down to about 8 mm as shown on image 24/12. I was not able to distend the lumen beyond 8 mm in the vicinity of the wrap. A 13 mm barium tablet impacted just above the level of the wrap and did not proceed down into the stomach. Continuous primary peristaltic waves in the esophagus were observed on 3/4 swallowing attempts. Occasional tertiary and secondary contractions noted. The stomach and duodenum appear grossly normal. No obvious ulceration although sensitivity is reduced by patient mobility and pain. IMPRESSION: 1. Small hiatal hernia with fundoplication wrap extending above the diaphragm. In the vicinity of the wrap, the lumen could only be distended to about 8 mm in diameter, and a 13 mm barium tablet impacted just above the level of the wrap. It is possible that herniation of the wrap into the chest has cause some tethering which narrows the surrounded lumen. The narrowing in this vicinity is smooth. 2. Small Zenker's diverticulum. 3.  Prominent stool throughout the colon favors constipation. 4. Multiple compression fractures and kyphoplasties. The patient has subacute compression fractures at T11 and T12, and associated pain limited the patient's ability to turn as described above. Electronically Signed   By: Van Clines M.D.   On: 11/01/2017 10:13   Ir Kypho Thoracic With Bone Biopsy  Result Date: 11/06/2017 INDICATION: Severe low back pain secondary to compression fracture at T11. EXAM: BALLOON KYPHOPLASTY AT T11 COMPARISON:  MRI of the lumbosacral spine of 10/10/2017. MEDICATIONS: As antibiotic prophylaxis, Ancef  2 g IV was ordered pre-procedure and administered intravenously within 1 hour of incision. ANESTHESIA/SEDATION: Moderate (conscious) sedation was employed during this procedure. A total of Versed 2 mg and Fentanyl 75 mcg, and Dilaudid 2 mg IV was administered intravenously. Moderate Sedation Time: 26 minutes. The patient's level of consciousness and vital signs were monitored continuously by radiology nursing throughout the procedure under my direct supervision. FLUOROSCOPY TIME:  Fluoroscopy Time: 11 minutes 30 seconds (0998 mGy) COMPLICATIONS: None immediate. PROCEDURE: Following a full explanation of the procedure along with the potential associated complications,  an informed witnessed consent was obtained. The patient was placed prone on the fluoroscopic table. The skin overlying the thoracolumbar region was then prepped and draped in the usual sterile fashion. The right and left pedicles were then infiltrated with 0.25% bupivacaine followed by the advancement of an 11-gauge Jamshidi needle through both pedicles into the posterior one-third at T11. These were then exchanged for a Kyphon advanced osteo introducer system comprised of a working cannula and a Kyphon osteo drill in both pedicles. Combinations were then advanced over a Kyphon osteo bone pin until the tips of the Kyphon osteo drills were in the posterior third at T11. At this time, the bone pins were removed. In a medial trajectory, the combinations were advanced until the tips of the working cannula were inside the posterior one-third at T11. Through the working cannula a Kyphon inflatable bone tamp 20 x 3 were advanced and positioned with the distal marker 5 mm from the anterior aspect of T11. Crossing of the midline was seen on the AP projection. At this time, the balloons were expanded using contrast via a Kyphon inflation syringe device via microtubing. Inflations were continued until there was apposition with the superior endplates. At this time,  methylmethacrylate mixture was reconstituted with Tobramycin in the Kyphon bone mixing device system. This was then loaded onto the Kyphon bone fillers. The balloons were deflated and removed followed by the instillation of 5 bone filler equivalents of methylmethacrylate mixture at T11 with excellent filling in the AP and lateral projections. No extravasation was noted in the disk spaces or posteriorly into the spinal canal. No epidural venous contamination was seen. The working cannula and the bone fillers were then retrieved and removed. Hemostasis was achieved at the skin entry sites. IMPRESSION: 1. Status post vertebral body augmentation using balloon kyphoplasty at T11 as described without event. Electronically Signed   By: Luanne Bras M.D.   On: 11/03/2017 15:12   Ct Renal Stone Study  Result Date: 11/08/2017 CLINICAL DATA:  RIGHT-sided flank pain with nausea and vomiting since yesterday suspected stone disease, history of colonic diverticulosis, stage III chronic kidney disease, asthma, coronary artery disease, hypertension, hiatal hernia, GERD EXAM: CT ABDOMEN AND PELVIS WITHOUT CONTRAST TECHNIQUE: Multidetector CT imaging of the abdomen and pelvis was performed following the standard protocol without IV contrast. COMPARISON:  04/05/2017 FINDINGS: Lower chest: Bibasilar atelectasis slightly greater on RIGHT. Hepatobiliary: Gallbladder surgically absent.  Liver unremarkable. Pancreas: Atrophic pancreas without mass Spleen: Normal appearance Adrenals/Urinary Tract: Adrenal glands normal appearance. Atrophic kidneys without mass or hydronephrosis. Normal appearing ureters and bladder. Small curvilinear metallic foreign body just below bladder base, of uncertain etiology. Stomach/Bowel: Appendix surgically absent by history. Diverticulosis of descending and sigmoid colon without evidence of diverticulitis. Stomach demonstrates postsurgical changes from fundoplication with small persistent hiatal  hernia. Bowel loops otherwise normal appearance. Vascular/Lymphatic: Atherosclerotic calcifications aorta without aneurysm. No adenopathy. Reproductive: Uterus surgically absent.  Atrophic ovaries. Other: No free air or free fluid. No acute inflammatory process. Tiny umbilical hernia containing fat. Musculoskeletal: Multiple prior thoracolumbar spine spinal augmentation procedures with postsurgical changes of posterior fusion L1-L3. Marked osseous demineralization. IMPRESSION: Distal colonic diverticulosis without evidence of diverticulitis. Small hiatal hernia with evidence of prior fundoplication. No acute intra-abdominal or intrapelvic abnormalities. Osseous demineralization with multiple thoracolumbar compression fractures and evidence of prior spinal augmentation procedures as well as L1-L3 spinal fusion. Electronically Signed   By: Lavonia Dana M.D.   On: 11/08/2017 16:57   Xr Lumbar Spine 2-3 Views  Result  Date: 10/26/2017 Mild compression deformity T11, kyphoplasty T12, L2, L3 and L4, Anterolishesis L4-5 grade 1-2. Fusion L1 to L3 with Pedicle screws and rods. The height and alignment through the fusion segment is well maintained.    Speciality Comments: No specialty comments available.    Procedures:  No procedures performed Allergies: Ace inhibitors; Aspartame; Atorvastatin; Biaxin [clarithromycin]; Daliresp [roflumilast]; Erythromycin; and Levofloxacin   Assessment / Plan:     Visit Diagnoses: Age-related osteoporosis without current pathological fracture - Her most recent DEXA on 3/18 was performed on a different scanner and revealed: BMD: 0.652 T-score -2.8.  Most recent vitamin D from 01/04/17 was 55 and calcium was 9.01 on 11/11/2017.  Patient was previously on Fosamax 70 mg by mouth once weekly.  She experienced nausea when taking Fosamax and discontinued.  Her insurance approved the coverage of Tymlos subcutaneous injections once daily she was skeptical about side effects.  We discussed  the indications, contraindications, and potential side effects of Tymlos today in the office.  She is willing to proceed with Tymlos now.  We have given her a prescription and will see her copayment cost.  If she cannot afford Tymlos and Reclast could be another option.   History of vertebral compression fracture: She continues to have recurrent vertebral fractures.  She has underwent multiple lumbar vertebral plasties/kyphoplasty's.  Her most recent was on 11/03/2017.  Cement was injected as well as a biopsy was performed.  The biopsy was negative for multiple myeloma.  She continues to have residual discomfort in her lumbar spine.  She saw Dr. Louanne Skye today in the office and will be seeing him again in 3 weeks.   Primary osteoarthritis of both knees: No warmth or effusion was noted.  She is good range of motion.  She has no discomfort in her knee joints at this time.  DDD (degenerative disc disease), lumbar - She is followed by Dr. Louanne Skye.  She was seen by Dr. Louanne Skye today in the office.  She will be returning for an office visit with him in 3 weeks.  Facet arthropathy  H/O kyphoplasty  Abnormal laboratory test result - IFE. followed by hematology.  She had multiple lumbar vertebral plasties and kyphoplasty's.  She had kyphoplasty and biopsy performed on 10/26/2017.  The biopsy was negative for multiple myeloma.  Other medical conditions are listed as follows:   History of hypertension  History of anxiety - Takes Xanax PRN.  History of asthma  History of neuropathy  History of hypercholesterolemia  History of cholecystectomy  History of appendectomy  History of vitamin D deficiency  History of gastroesophageal reflux (GERD)    Orders: No orders of the defined types were placed in this encounter.  Meds ordered this encounter  Medications  . Abaloparatide (TYMLOS) 3120 MCG/1.56ML SOPN    Sig: Inject 80 mcg into the skin daily.    Dispense:  1 pen    Refill:  2    Face-to-face  time spent with patient was 30 minutes. Greater than 50% of time was spent in counseling and coordination of care.  Follow-Up Instructions: Return in about 6 months (around 05/17/2018) for Osteoarthritis, Osteoporosis, DDD.   Hazel Sams PA-C   I examined and evaluated the patient with Hazel Sams PA.  Patient has some discomfort in the lumbar spine with range of motion on my examination today.  She has significant kyphosis.  I had detailed discussion regarding different treatment options with her.  She was eventually convinced to start on Tymlos.  Depending on her copayment we will proceed with the Tymlos.  The plan of care was discussed as noted above.  [name], MD  Note - This record has been created using Editor, commissioning.  Chart creation errors have been sought, but may not always  have been located. Such creation errors do not reflect on  the standard of medical care.

## 2017-11-06 NOTE — ED Triage Notes (Signed)
Patient told by PCP office to go to ED for worsening back pain today - had a procedure on Friday and was told it could be soreness from that, but patient stated this pain is new and severe in nature. She denies new injury. Denies incontinence or new numbness/tingling. Recently had MRI of lower back and sacrum. Patient in tears in triage. Hx osteoporosis and told to go to ED for possible new fracture.

## 2017-11-06 NOTE — ED Provider Notes (Signed)
Patient placed in Quick Look pathway, seen and evaluated   Chief Complaint: back pain  HPI:   Patient presents with worsening right-sided thoracic back pain.underwent IR guided T11 kyphoplasty/vertebral plasty on 11/03/17 3 days ago. States "well I never did get better ". States her pain is a little bit higher. It is primarily right-sided. Denies bowel or bladder incontinence, saddle anesthesia, fevers. Called Dr. Arlean Hopping office and spoke with his RN, sent to the ED for rule out of new fracture.  ROS: positive for back pain negative for incontinence, fevers,numbness, or weakness.  Physical Exam:   Gen: appears uncomfortable  Neuro: Awake and Alert  Skin: Warm    Focused Exam: well-healing midline incision. Mild midline thoracic spine tenderness at around the level of T10-T12. Also has diffuse midline lumbar spine tenderness. There is severe right parathoracic muscle tenderness. No deformity, crepitus, or step-off noted. 5/5 strength of BLE major muscle groups. 2+ DP/PT pulses bilaterally   Initiation of care has begun. The patient has been counseled on the process, plan, and necessity for staying for the completion/evaluation, and the remainder of the medical screening examination    Debroah Baller 11/06/17 1806    Mesner, Corene Cornea, MD 11/06/17 [id number]

## 2017-11-06 NOTE — Telephone Encounter (Signed)
Pt called and stated that she was having a lot of back pain since her procedure. I told the pt that it is possible that she could be sore considering she just had the procedure done Friday  but she stated this was more pain and seemed to be in a different area. She said this pain was way worse than after her last procedure. Ally the PA spoke with Dr. Estanislado Pandy and he advised the pt to go to the ED if she was having that much pain. Pt agreed to do so if it got worse.

## 2017-11-06 NOTE — ED Provider Notes (Signed)
[facility] EMERGENCY DEPARTMENT Provider Note   CSN: [id number] Arrival date & time: 11/06/17  1646  History   Chief Complaint Chief Complaint  Patient presents with  . Back Pain    HPI [name] [name] is a 82 y.o. female.  The history is provided by the patient and medical records.  Back Pain   This is a chronic problem. Episode onset: one month ago. The problem occurs constantly. The problem has not changed since onset.The pain is associated with no known injury. The pain is present in the thoracic spine and lumbar spine. The pain radiates to the right thigh. The pain is severe. The symptoms are aggravated by twisting. The pain is the same all the time. Pertinent negatives include no chest pain, no fever, no numbness, no abdominal pain, no bowel incontinence, no bladder incontinence, no dysuria and no weakness.    Past Medical History:  Diagnosis Date  . Anxiety    takes Xanax daily as needed  . Asthma    Albuterol daily as needed  . Blood transfusion without reported diagnosis   . CAD (coronary artery disease)    LHC 2003 with 40% pLAD, 30% mLAD, 100% D1 (moderate vessel), 100%  D2 (small vessel), 95% mid small OM2.  Pt had PTCA to D1;  D2 and OM2 small vessels and tx medically;  Last Myoview (2012): anterior infarct seen with scar, no ischemia. EF normal. Pt managed medically;  Lexiscan Myoview (12/14):  Low risk; ant scar with very mild peri-infarct ischemia; EF 55% with ant AK   . Cataract   . Chronic back pain    HNP, DDD, spinal stenosis, vertebral compression fractures.   . Chronic nausea    takes Zofran daily as needed.  normal gastric emptying study 03/2013  . CKD (chronic kidney disease), stage III ([place]) 09/27/2017  . Constipation    felt to be functional. takes Senokot and Miralax daily.  treated with Linzess in past.   . DIVERTICULOSIS, COLON 08/28/2008   Qualifier: Diagnosis of  By: [name], [name], [name]    . Elevated LFTs 2015   probably med  induced DILI, from Augmentin vs Pravastatin  . GERD (gastroesophageal reflux disease)    hx of esophageal stricture   . Hiatal hernia    Paraesophageal hernias. s/p fundoplications in 4562 and 04/2013  . History of bronchitis several of yrs ago  . History of colon polyps 03/2009, 03/2011   adenomatous colon polyps, no high grade dysplasia.   [name] History of vertigo    no meds  . Hyperlipidemia    takes Pravastatin daily  . Hypertension    takes Amlodipine,Metoprolol,and Losartan daily  . Nausea 03/2016  . Osteoporosis   . PONV (postoperative nausea and vomiting)    yrs ago  . Slow urinary stream    takes Rapaflo daily  . TIA (transient ischemic attack) 1995   no residual effects noted    Patient Active Problem List   Diagnosis Date Noted  . CKD (chronic kidney disease), stage III ([place]) 09/27/2017  . Low back pain 09/26/2017  . T12 compression fracture ([place]) 09/26/2017  . Chronic bronchitis ([place]) 08/09/2017  . Atherosclerosis of aorta ([place]) 04/05/2017  . Costochondritis 11/14/2016  . Anxiety state 06/08/2016  . History of lumbar spinal fusion 04/13/2016  . Spondylolisthesis, lumbar region 04/13/2016  . Leukocytosis 03/28/2016  . Dysphagia   . Lumbar radiculopathy, acute 12/07/2015    Class: Chronic  . Spinal stenosis, lumbar region, with neurogenic claudication  05/29/2015  . DDD (degenerative disc disease), lumbar 04/21/2015  . Mixed hyperlipidemia 11/24/2014  . GERD  11/24/2014  . Depression, major, in partial remission ([place]) 09/26/2014  . Chronic pain syndrome 09/26/2014  . Coronary artery disease due to lipid rich plaque   . Osteoporosis 02/10/2014  . Prediabetes 08/14/2013  . Vitamin D deficiency 08/14/2013  . Medication management 08/14/2013  . Constipation, chronic 04/03/2013  . MGUS (monoclonal gammopathy of unknown significance) 03/05/2013  . Herniated lumbar intervertebral disc 06/25/2012    Class: Acute  . Vertebral compression fracture ([place]) 06/23/2012  .  ESOPHAGEAL STRICTURE 08/28/2008  . Mild intermittent asthma without complication 85/06/7739  . Incarcerated recurrent paraesophageal hiatal hernia s/p lap redo repair [id number] 01/31/2008  . Essential hypertension 07/25/2007    Past Surgical History:  Procedure Laterality Date  . APPENDECTOMY     patient unsure of date  . BACK SURGERY    . BLADDER REPAIR    . CHOLECYSTECTOMY     Patient unsure of date.  . CORONARY ANGIOPLASTY  11/16/2001   1 stent  . ESOPHAGEAL MANOMETRY N/A 03/25/2013   Procedure: ESOPHAGEAL MANOMETRY (EM);  Surgeon: Inda Castle, MD;  Location: [facility] ENDOSCOPY;  Service: Endoscopy;  Laterality: N/A;  . ESOPHAGOGASTRODUODENOSCOPY N/A 03/15/2013   Procedure: ESOPHAGOGASTRODUODENOSCOPY (EGD);  Surgeon: Jerene Bears, MD;  Location[facility] ENDOSCOPY;  Service: Endoscopy;  Laterality: N/A;  . ESOPHAGOGASTRODUODENOSCOPY N/A 12/25/2015   Procedure: ESOPHAGOGASTRODUODENOSCOPY (EGD);  Surgeon: Doran Stabler, MD;  Location: [facility] ENDOSCOPY;  Service: Endoscopy;  Laterality: N/A;  . EYE SURGERY  11/2000   bilateral cataracts with lens implant  . FIXATION KYPHOPLASTY LUMBAR SPINE  X 2   "L3-4"  . HEMORRHOID SURGERY    . HIATAL HERNIA REPAIR  06/03/2011   Procedure: LAPAROSCOPIC REPAIR OF HIATAL HERNIA;  Surgeon: Adin Hector, MD;  Location: [facility] ORS;  Service: General;  Laterality: N/A;  . HIATAL HERNIA REPAIR N/A 04/24/2013   Procedure: LAPAROSCOPIC  REPAIR RECURRENT PARASOPHAGEAL HIATAL HERNIA WITH FUNDOPLICATION;  Surgeon: Adin Hector, MD;  Location: [facility] ORS;  Service: General;  Laterality: N/A;  . INSERTION OF MESH N/A 04/24/2013   Procedure: INSERTION OF MESH;  Surgeon: Adin Hector, MD;  Location: [facility] ORS;  Service: General;  Laterality: N/A;  . IR KYPHO THORACIC WITH BONE BIOPSY  09/29/2017  . IR KYPHO THORACIC WITH BONE BIOPSY  11/03/2017  . LAPAROSCOPIC LYSIS OF ADHESIONS N/A 04/24/2013   Procedure: LAPAROSCOPIC LYSIS OF ADHESIONS;  Surgeon: Adin Hector, MD;   Location: [facility] ORS;  Service: General;  Laterality: N/A;  . LAPAROSCOPIC NISSEN FUNDOPLICATION  11/25/9468   Procedure: LAPAROSCOPIC NISSEN FUNDOPLICATION;  Surgeon: Adin Hector, MD;  Location: [facility] ORS;  Service: General;  Laterality: N/A;  . LUMBAR FUSION  12/07/2015   Right L2-3 facetectomy, posterolateral fusion L2-3 with pedicle screws, rods, local bone graft, Vivigen cancellous chips. Fusion extended to the L1 level with pedicle screws and rods  . LUMBAR LAMINECTOMY/DECOMPRESSION MICRODISCECTOMY Right 06/26/2012   Procedure: LUMBAR LAMINECTOMY/DECOMPRESSION MICRODISCECTOMY;  Surgeon: Jessy Oto, MD;  Location: [facility];  Service: Orthopedics;  Laterality: Right;  RIGHT L4-5 MICRODISCECTOMY  . LUMBAR LAMINECTOMY/DECOMPRESSION MICRODISCECTOMY N/A 05/29/2015   Procedure: RIGHT L2-3 MICRODISCECTOMY;  Surgeon: Jessy Oto, MD;  Location: [facility];  Service: Orthopedics;  Laterality: N/A;  . TOTAL ABDOMINAL HYSTERECTOMY  1975   partial     OB History   None      Home Medications    Prior to Admission  medications   Medication Sig Start Date End Date Taking? Authorizing Provider  acetaminophen (TYLENOL) 500 MG tablet Take 1,000 mg by mouth every 6 (six) hours as needed for mild pain.   Yes [provider]  albuterol (PROAIR HFA) 108 (90 Base) MCG/ACT inhaler Inhale 2 puffs into the lungs every 6 (six) hours as needed for wheezing or shortness of breath. 09/22/16  Yes Vicie Mutters, PA-C  ALPRAZolam Duanne Moron) 0.5 MG tablet Take 1/2 to 1 tablet 2 to 3 x / day ONLY if Anxiety Attack and please try to limit to 5 days /week to avoid addiction Patient taking differently: Take 0.25-0.5 mg by mouth 3 (three) times daily as needed for anxiety.  08/14/17  Yes [name], MD  amLODipine (NORVASC) 10 MG tablet TAKE 1 TABLET BY MOUTH AT BEDTIME 09/08/17  Yes [name], MD  Cholecalciferol (VITAMIN D3) 5000 units CAPS Take 5,000 Units by mouth 2 (two) times daily.    Yes [provider]  diclofenac sodium (VOLTAREN) 1 % GEL APPLY 4 G TOPICALLY FOUR TIMES DAILY Patient taking differently: APPLY 4 G TOPICALLY FOUR TIMES DAILY AS NEEDED 11/03/17  Yes Jessy Oto, MD  dicyclomine (BENTYL) 10 MG capsule TAKE 1 CAPSULE BY MOUTH EVERY 8 HOURS AS NEEDED FOR SPASM 08/04/17  Yes Armbruster, Carlota Raspberry, MD  ipratropium-albuterol (DUONEB) 0.5-2.5 (3) MG/3ML SOLN USE ONE VIAL IN NEBULIZER 4 TIMES DAILY Patient taking differently: Take 3 mLs by nebulization every 6 (six) hours as needed (wheezing). USE ONE VIAL IN NEBULIZER 4 TIMES DAILY as needed 08/09/17  Yes Corbett, Caryl Pina, NP  ketoconazole (NIZORAL) 2 % cream APPLY 1 APPLICATION TOPICALLY TWO TIMES DAILY Patient taking differently: APPLY 1 APPLICATION TOPICALLY TWO TIMES DAILY AS NEEDED FOR IRRITATION 02/10/17  Yes Vicie Mutters, PA-C  losartan (COZAAR) 100 MG tablet TAKE ONE TABLET BY MOUTH ONCE DAILY (DISCONTINUE  BENICAR) 06/22/17  Yes [name], MD  meclizine (ANTIVERT) 25 MG tablet 1/2-1 pill up to 3 times daily for motion sickness/dizziness Patient taking differently: Take 12.5-25 mg by mouth 3 (three) times daily as needed for dizziness. 1/2-1 pill up to 3 times daily for motion sickness/dizziness 06/08/16  Yes Vicie Mutters, PA-C  metoprolol tartrate (LOPRESSOR) 50 MG tablet Take 50 mg by mouth at bedtime.   Yes [provider]  mometasone-formoterol (DULERA) 100-5 MCG/ACT AERO Inhale 2 puffs into the lungs every 12 (twelve) hours. Patient taking differently: Inhale 2 puffs into the lungs every 12 (twelve) hours as needed for wheezing or shortness of breath.  09/08/17  Yes [name], MD  neomycin-polymyxin b-dexamethasone (MAXITROL) 3.5-10000-0.1 OINT APPLY TO STYE FOUR TIMES DAILY. Patient taking differently: Place 1 application into both eyes See admin instructions. APPLY TO STYE FOUR TIMES DAILY AS NEEDED FOR EYE STYE 07/07/17  Yes Vicie Mutters, PA-C  nitroGLYCERIN (NITROSTAT) 0.4 MG SL tablet  Place 1 tablet (0.4 mg total) under the tongue every 5 (five) minutes as needed for chest pain. 09/04/14  Yes Larey Dresser, MD  ondansetron (ZOFRAN-ODT) 8 MG disintegrating tablet DISSOLVE ONE TABLET IN MOUTH EVERY 8 HOURS AS NEEDED FOR NAUSEA AND  VOMITING 10/05/17  Yes [name], NP  OVER THE COUNTER MEDICATION Take 1,000 mg by mouth 2 (two) times daily. [place] Calcium 1000mg    Yes [provider]  oxyCODONE-acetaminophen (PERCOCET/ROXICET) 5-325 MG tablet Take 1 tablet by mouth every 4 (four) hours as needed for severe pain. 03/10/17  Yes Tegeler, Gwenyth Allegra, MD  polyethylene glycol powder (GLYCOLAX/MIRALAX) powder  Take 17 g by mouth 2 (two) times daily. Patient taking differently: Take 17 g by mouth 2 (two) times daily as needed for mild constipation or moderate constipation.  05/16/17  Yes Armbruster, Carlota Raspberry, MD  pravastatin (PRAVACHOL) 40 MG tablet Take 1 tablet (40 mg total) by mouth daily. 07/31/17  Yes [name], MD  promethazine (PHENERGAN) 25 MG tablet Take 1 tablet (25 mg total) by mouth every 6 (six) hours as needed for nausea or vomiting. 09/04/17  Yes [name], MD  senna-docusate (SENOKOT-S) 8.6-50 MG tablet Take 2 tablets by mouth 2 (two) times daily. 12/26/15  Yes Emokpae, Courage, MD  silodosin (RAPAFLO) 8 MG CAPS capsule Take 8 mg by mouth daily with breakfast.    Yes [provider]  gabapentin (NEURONTIN) 300 MG capsule TAKE 1 CAPSULE BY MOUTH THREE TIMES DAILY FOR  CHRONIC  PAIN. Patient not taking: Reported on 11/06/2017 10/06/17   [name], MD  methocarbamol (ROBAXIN) 500 MG tablet Take 1 tablet (500 mg total) by mouth every 6 (six) hours as needed for muscle spasms. 11/07/17   Prescilla Sours, MD    Family History Family History  Problem Relation Age of Onset  . Colon cancer Mother 8  . Heart disease Father 25       heart attack  . Brain cancer Brother        tumor   . Heart disease Brother   . Heart disease Brother   .  Kidney disease Daughter     Social History Social History   Tobacco Use  . Smoking status: Never Smoker  . Smokeless tobacco: Never Used  Substance Use Topics  . Alcohol use: No  . Drug use: No     Allergies   Ace inhibitors; Aspartame; Atorvastatin; Biaxin [clarithromycin]; Daliresp [roflumilast]; Erythromycin; and Levofloxacin   Review of Systems Review of Systems  Constitutional: Negative for fever.  HENT: Negative for congestion and sore throat.   Eyes: Negative for visual disturbance.  Respiratory: Negative for shortness of breath.   Cardiovascular: Negative for chest pain.  Gastrointestinal: Negative for abdominal pain, bowel incontinence, diarrhea and vomiting.  Genitourinary: Negative for bladder incontinence and dysuria.  Musculoskeletal: Positive for back pain. Negative for gait problem and neck pain.  Neurological: Negative for syncope, weakness and numbness.  All other systems reviewed and are negative.    Physical Exam Updated Vital Signs BP (!) 150/70   Pulse 83   Temp 98.5 F (36.9 C) (Oral)   Resp (!) 22   SpO2 96%   Physical Exam  Constitutional: She is oriented to person, place, and time. No distress.  HENT:  Head: Normocephalic and atraumatic.  Mouth/Throat: Oropharynx is clear and moist.  Eyes: Pupils are equal, round, and reactive to light. Conjunctivae are normal.  Neck: Neck supple. No tracheal deviation present.  Cardiovascular: Normal rate, regular rhythm, normal heart sounds and intact distal pulses.  No murmur heard. Pulmonary/Chest: Effort normal and breath sounds normal. No stridor. No respiratory distress. She has no wheezes. She has no rales.  Abdominal: Soft. She exhibits no distension and no mass. There is no tenderness. There is no guarding.  Musculoskeletal: She exhibits no edema or deformity.  Marked tenderness to light touch in both the lower thoracic region and right posterolateral back to light palpation. 5/5 strength in  BLE with intact sensation to light touch throughout.  Neurological: She is alert and oriented to person, place, and time.  Skin: Skin is warm and dry.  Psychiatric: She has a normal mood and affect. Her behavior is normal.  Nursing note and vitals reviewed.    ED Treatments / Results  Labs (all labs ordered are listed, but only abnormal results are displayed) Labs Reviewed  BASIC METABOLIC PANEL    EKG None  Radiology Dg Thoracic Spine 2 View  Result Date: 11/06/2017 CLINICAL DATA:  Worsening right-sided back pain EXAM: THORACIC SPINE 2 VIEWS COMPARISON:  Chest x-ray 09/26/2017, MRI 12/13/2016, radiograph 12/06/2016 MRI 10/10/2017 FINDINGS: Minimal kyphosis at the thoracolumbar junction. Post augmentation changes at T11 and T12, findings at T11 are new since radiograph 10/26/2017. Partially visualized lumbar spine hardware. Remaining vertebral body heights demonstrate normal stature. IMPRESSION: Post augmentation changes at T11 and T12. Remaining thoracic vertebral bodies show no acute interval change compared to prior radiograph Electronically Signed   By: Donavan Foil M.D.   On: 11/06/2017 19:04   Dg Lumbar Spine Complete  Result Date: 11/06/2017 CLINICAL DATA:  Back pain EXAM: LUMBAR SPINE - COMPLETE 4+ VIEW COMPARISON:  10/26/2017, 09/26/2017 and 10/10/2017 MRI FINDINGS: Surgical clips in the upper abdomen. Posterior stabilization rods and fixating screws L1 through L3. Multiple compression fractures L1 through L4 with post augmentation changes at L2-L3 and L4. Similar appearance of post augmentation changes at T12. New post augmentation changes at T11. Marked degenerative change at L5-S1. Small amount of radiopaque material in the distal colon. IMPRESSION: 1. Interval post augmentation changes at T11 since 10/26/2017 radiograph. Multiple chronic compression 6 L1 through L4 with postsurgical changes L1 through L3. 2. No definite acute interval change radiographically since most recent  prior radiograph. Electronically Signed   By: Donavan Foil M.D.   On: 11/06/2017 19:08   Mr Thoracic Spine Wo Contrast  Result Date: 11/07/2017 CLINICAL DATA:  Right-sided thoracic back pain. Recent T11 vertebral augmentation. EXAM: MRI THORACIC AND LUMBAR SPINE WITHOUT CONTRAST TECHNIQUE: Multiplanar and multiecho pulse sequences of the thoracic and lumbar spine were obtained without intravenous contrast. COMPARISON:  Lumbar spine MRI 10/10/2017 FINDINGS: MRI THORACIC SPINE FINDINGS Alignment:  Physiologic. Vertebrae: There is compression deformity with less than 25% height loss at T11 and approximately 25% height loss at T12. Findings of recent vertebral augmentation. The appearance of T12 is unchanged compared to the 10/10/2017 MRI. The T11 augmentation is new compared to prior studies. There is diffuse marrow edema at both levels. Cord:  Normal signal and morphology. Paraspinal and other soft tissues: Mild edema in the posterior paraspinal soft tissues at T11 consistent with recent vertebral augmentation. Disc levels: No disc herniation or stenosis. MRI LUMBAR SPINE FINDINGS Segmentation:  Standard. Alignment:  Grade 1 anterolisthesis at L4-L5. Vertebrae: L1-L3 PLIF. Chronic compression deformities at the L1-L4 levels are unchanged. There are findings compatible with multilevel vertebral augmentation, also unchanged. No new compression fracture. No discitis. Conus medullaris and cauda equina: Conus extends to the L1 level. Conus and cauda equina appear normal. Paraspinal and other soft tissues: No retroperitoneal abnormality. Disc levels: L1-L2: Postfusion changes.  No stenosis. L2-L3: Postfusion changes. Small disc osteophyte complex and facet hypertrophy with mild narrowing of the spinal canal. Mild bilateral foraminal stenosis. L3-L4: Moderate facet hypertrophy with mild bilateral foraminal narrowing. No spinal canal stenosis. L4-L5: Grade 1 anterolisthesis. Posterior decompression. No spinal canal  stenosis. Mild left foraminal stenosis. L5-S1: Disc space narrowing and endplate ridging. No spinal canal stenosis. Moderate bilateral neural foraminal stenosis, unchanged. IMPRESSION: MR THORACIC SPINE IMPRESSION 1. New T11 vertebral augmentation since the prior study. Diffuse marrow edema at T11 and T12, which  may be posttraumatic or post procedural. No height loss progression at T12. 2. No thoracic spinal canal stenosis. MR LUMBAR SPINE IMPRESSION 1. Unchanged appearance of L1-L4 compression deformities with post vertebral augmentation changes. No new compression fracture. 2. Unchanged mild L2-3 spinal canal stenosis. 3. Unchanged multilevel mild-to-moderate foraminal stenosis, greatest at L5-S1. Electronically Signed   By: Ulyses Jarred M.D.   On: 11/07/2017 00:59   Mr Lumbar Spine Wo Contrast  Result Date: 11/07/2017 CLINICAL DATA:  Right-sided thoracic back pain. Recent T11 vertebral augmentation. EXAM: MRI THORACIC AND LUMBAR SPINE WITHOUT CONTRAST TECHNIQUE: Multiplanar and multiecho pulse sequences of the thoracic and lumbar spine were obtained without intravenous contrast. COMPARISON:  Lumbar spine MRI 10/10/2017 FINDINGS: MRI THORACIC SPINE FINDINGS Alignment:  Physiologic. Vertebrae: There is compression deformity with less than 25% height loss at T11 and approximately 25% height loss at T12. Findings of recent vertebral augmentation. The appearance of T12 is unchanged compared to the 10/10/2017 MRI. The T11 augmentation is new compared to prior studies. There is diffuse marrow edema at both levels. Cord:  Normal signal and morphology. Paraspinal and other soft tissues: Mild edema in the posterior paraspinal soft tissues at T11 consistent with recent vertebral augmentation. Disc levels: No disc herniation or stenosis. MRI LUMBAR SPINE FINDINGS Segmentation:  Standard. Alignment:  Grade 1 anterolisthesis at L4-L5. Vertebrae: L1-L3 PLIF. Chronic compression deformities at the L1-L4 levels are unchanged.  There are findings compatible with multilevel vertebral augmentation, also unchanged. No new compression fracture. No discitis. Conus medullaris and cauda equina: Conus extends to the L1 level. Conus and cauda equina appear normal. Paraspinal and other soft tissues: No retroperitoneal abnormality. Disc levels: L1-L2: Postfusion changes.  No stenosis. L2-L3: Postfusion changes. Small disc osteophyte complex and facet hypertrophy with mild narrowing of the spinal canal. Mild bilateral foraminal stenosis. L3-L4: Moderate facet hypertrophy with mild bilateral foraminal narrowing. No spinal canal stenosis. L4-L5: Grade 1 anterolisthesis. Posterior decompression. No spinal canal stenosis. Mild left foraminal stenosis. L5-S1: Disc space narrowing and endplate ridging. No spinal canal stenosis. Moderate bilateral neural foraminal stenosis, unchanged. IMPRESSION: MR THORACIC SPINE IMPRESSION 1. New T11 vertebral augmentation since the prior study. Diffuse marrow edema at T11 and T12, which may be posttraumatic or post procedural. No height loss progression at T12. 2. No thoracic spinal canal stenosis. MR LUMBAR SPINE IMPRESSION 1. Unchanged appearance of L1-L4 compression deformities with post vertebral augmentation changes. No new compression fracture. 2. Unchanged mild L2-3 spinal canal stenosis. 3. Unchanged multilevel mild-to-moderate foraminal stenosis, greatest at L5-S1. Electronically Signed   By: Ulyses Jarred M.D.   On: 11/07/2017 00:59    Procedures Procedures (including critical care time)  Medications Ordered in ED Medications  HYDROmorphone (DILAUDID) injection 1 mg (1 mg Intravenous Given 11/06/17 2222)  ondansetron (ZOFRAN) injection 4 mg (4 mg Intravenous Given 11/06/17 2222)  LORazepam (ATIVAN) tablet 1 mg (1 mg Oral Given 11/06/17 2258)  ketorolac (TORADOL) 15 MG/ML injection 15 mg (15 mg Intravenous Given 11/06/17 2258)  predniSONE (DELTASONE) tablet 40 mg (40 mg Oral Given 11/06/17 2258)  ondansetron  (ZOFRAN) injection 4 mg (4 mg Intravenous Given 11/07/17 0028)     Initial Impression / Assessment and Plan / ED Course  I have reviewed the triage vital signs and the nursing notes.  Pertinent labs & imaging results that were available during my care of the patient were reviewed by me and considered in my medical decision making (see chart for details).  Patient is a 82 y.o. female with history  of CAD, CKD, and chronic back pain with lumbar radiculopathy recently found to have T11 IEP fracture s/p IR-guided kyphoplasty 3 days ago presenting to the ED for ongoing back pain. She states she has had severe low back pain which has been ongoing since before her recent procedure. She states it is similar in character and intensity as compared to prior. No new injury. Ambulating at home. Unrelieved with home oxycodone. Radiation to the right buttock and thigh. No numbness, weakness, or bowel/bladder symptoms. No urinary or other systemic symptoms.  With recent spinal instrumentation and midline pain, MRI T/L-spine ordered to evaluate for epidural complication which showed no acute changes. Pain control with narcotics, steroids, and Toradol. Doubt cauda equina. Renal function at baseline. With chronicity of pain and prior CTA C/A/P, doubt intra-abdominal process such as AAA, pyelonephritis, or renal stone. Patient with persistent pain on recheck. VS remain stable. Will discharge home with PCP and pain f/u.  Patient informed of all ED findings. Return precautions and follow up plan reviewed. Prescription for Robaxin given. Advised OTC lidocaine patches. All questions answered.   Final Clinical Impressions(s) / ED Diagnoses   Final diagnoses:  Chronic right-sided low back pain with right-sided sciatica      Prescilla Sours, MD 11/07/17 0128    Merrily Pew, MD 11/07/17 [phone]

## 2017-11-07 ENCOUNTER — Ambulatory Visit (HOSPITAL_COMMUNITY): Admission: RE | Admit: 2017-11-07 | Payer: PPO | Source: Ambulatory Visit

## 2017-11-07 DIAGNOSIS — M549 Dorsalgia, unspecified: Secondary | ICD-10-CM | POA: Diagnosis not present

## 2017-11-07 DIAGNOSIS — M546 Pain in thoracic spine: Secondary | ICD-10-CM | POA: Diagnosis not present

## 2017-11-07 MED ORDER — METHOCARBAMOL 500 MG PO TABS: 500.0000 mg | ORAL_TABLET | Freq: Four times a day (QID) | ORAL | 0 refills | Status: DC | PRN

## 2017-11-07 MED ORDER — ONDANSETRON HCL 4 MG/2ML IJ SOLN
4.0000 mg | Freq: Once | INTRAMUSCULAR | Status: AC
  Administered 2017-11-07: 4 mg via INTRAVENOUS
  Filled 2017-11-07: qty 2

## 2017-11-08 ENCOUNTER — Inpatient Hospital Stay (HOSPITAL_COMMUNITY): Payer: PPO

## 2017-11-08 ENCOUNTER — Ambulatory Visit (INDEPENDENT_AMBULATORY_CARE_PROVIDER_SITE_OTHER): Payer: PPO | Admitting: Specialist

## 2017-11-08 ENCOUNTER — Inpatient Hospital Stay (HOSPITAL_COMMUNITY)
Admission: EM | Admit: 2017-11-08 | Discharge: 2017-11-11 | DRG: 392 | Disposition: A | Discharge status: Home / Self Care | Payer: PPO | Attending: Family Medicine | Admitting: Family Medicine

## 2017-11-08 ENCOUNTER — Other Ambulatory Visit: Payer: Self-pay

## 2017-11-08 ENCOUNTER — Encounter (HOSPITAL_COMMUNITY): Payer: Self-pay | Admitting: Emergency Medicine

## 2017-11-08 ENCOUNTER — Encounter (HOSPITAL_COMMUNITY): Payer: Self-pay

## 2017-11-08 ENCOUNTER — Ambulatory Visit (HOSPITAL_COMMUNITY)
Admission: EM | Admit: 2017-11-08 | Discharge: 2017-11-08 | Disposition: A | Discharge status: Home / Self Care | Payer: PPO | Source: Home / Self Care | Attending: Family Medicine | Admitting: Family Medicine

## 2017-11-08 DIAGNOSIS — I252 Old myocardial infarction: Secondary | ICD-10-CM

## 2017-11-08 DIAGNOSIS — F411 Generalized anxiety disorder: Secondary | ICD-10-CM | POA: Diagnosis not present

## 2017-11-08 DIAGNOSIS — Z9861 Coronary angioplasty status: Secondary | ICD-10-CM

## 2017-11-08 DIAGNOSIS — K449 Diaphragmatic hernia without obstruction or gangrene: Secondary | ICD-10-CM | POA: Diagnosis present

## 2017-11-08 DIAGNOSIS — M546 Pain in thoracic spine: Secondary | ICD-10-CM

## 2017-11-08 DIAGNOSIS — R112 Nausea with vomiting, unspecified: Principal | ICD-10-CM | POA: Diagnosis present

## 2017-11-08 DIAGNOSIS — F324 Major depressive disorder, single episode, in partial remission: Secondary | ICD-10-CM | POA: Diagnosis present

## 2017-11-08 DIAGNOSIS — R109 Unspecified abdominal pain: Secondary | ICD-10-CM

## 2017-11-08 DIAGNOSIS — N183 Chronic kidney disease, stage 3 (moderate): Secondary | ICD-10-CM | POA: Diagnosis not present

## 2017-11-08 DIAGNOSIS — E1122 Type 2 diabetes mellitus with diabetic chronic kidney disease: Secondary | ICD-10-CM | POA: Diagnosis not present

## 2017-11-08 DIAGNOSIS — J45909 Unspecified asthma, uncomplicated: Secondary | ICD-10-CM | POA: Diagnosis present

## 2017-11-08 DIAGNOSIS — D472 Monoclonal gammopathy: Secondary | ICD-10-CM | POA: Diagnosis present

## 2017-11-08 DIAGNOSIS — M810 Age-related osteoporosis without current pathological fracture: Secondary | ICD-10-CM | POA: Diagnosis not present

## 2017-11-08 DIAGNOSIS — R1111 Vomiting without nausea: Secondary | ICD-10-CM | POA: Diagnosis not present

## 2017-11-08 DIAGNOSIS — I2510 Atherosclerotic heart disease of native coronary artery without angina pectoris: Secondary | ICD-10-CM | POA: Diagnosis present

## 2017-11-08 DIAGNOSIS — I10 Essential (primary) hypertension: Secondary | ICD-10-CM | POA: Diagnosis present

## 2017-11-08 DIAGNOSIS — E876 Hypokalemia: Secondary | ICD-10-CM | POA: Diagnosis present

## 2017-11-08 DIAGNOSIS — Z981 Arthrodesis status: Secondary | ICD-10-CM

## 2017-11-08 DIAGNOSIS — R7303 Prediabetes: Secondary | ICD-10-CM | POA: Diagnosis present

## 2017-11-08 DIAGNOSIS — Z8673 Personal history of transient ischemic attack (TIA), and cerebral infarction without residual deficits: Secondary | ICD-10-CM

## 2017-11-08 DIAGNOSIS — K440 Diaphragmatic hernia with obstruction, without gangrene: Secondary | ICD-10-CM | POA: Diagnosis present

## 2017-11-08 DIAGNOSIS — F3341 Major depressive disorder, recurrent, in partial remission: Secondary | ICD-10-CM | POA: Diagnosis not present

## 2017-11-08 DIAGNOSIS — J4520 Mild intermittent asthma, uncomplicated: Secondary | ICD-10-CM | POA: Diagnosis present

## 2017-11-08 DIAGNOSIS — G8929 Other chronic pain: Secondary | ICD-10-CM | POA: Diagnosis present

## 2017-11-08 DIAGNOSIS — N182 Chronic kidney disease, stage 2 (mild): Secondary | ICD-10-CM | POA: Diagnosis present

## 2017-11-08 DIAGNOSIS — K219 Gastro-esophageal reflux disease without esophagitis: Secondary | ICD-10-CM | POA: Diagnosis not present

## 2017-11-08 DIAGNOSIS — I129 Hypertensive chronic kidney disease with stage 1 through stage 4 chronic kidney disease, or unspecified chronic kidney disease: Secondary | ICD-10-CM | POA: Diagnosis not present

## 2017-11-08 DIAGNOSIS — G2581 Restless legs syndrome: Secondary | ICD-10-CM | POA: Diagnosis not present

## 2017-11-08 DIAGNOSIS — F419 Anxiety disorder, unspecified: Secondary | ICD-10-CM | POA: Diagnosis present

## 2017-11-08 DIAGNOSIS — K222 Esophageal obstruction: Secondary | ICD-10-CM | POA: Diagnosis not present

## 2017-11-08 DIAGNOSIS — E782 Mixed hyperlipidemia: Secondary | ICD-10-CM | POA: Diagnosis present

## 2017-11-08 DIAGNOSIS — S39012A Strain of muscle, fascia and tendon of lower back, initial encounter: Secondary | ICD-10-CM | POA: Diagnosis present

## 2017-11-08 DIAGNOSIS — G894 Chronic pain syndrome: Secondary | ICD-10-CM | POA: Diagnosis not present

## 2017-11-08 DIAGNOSIS — E785 Hyperlipidemia, unspecified: Secondary | ICD-10-CM | POA: Diagnosis not present

## 2017-11-08 DIAGNOSIS — K5900 Constipation, unspecified: Secondary | ICD-10-CM | POA: Diagnosis not present

## 2017-11-08 DIAGNOSIS — R1310 Dysphagia, unspecified: Secondary | ICD-10-CM

## 2017-11-08 LAB — CBC WITH DIFFERENTIAL/PLATELET
Abs Immature Granulocytes: 0 10*3/uL (ref 0.0–0.1)
Basophils Absolute: 0 10*3/uL (ref 0.0–0.1)
Basophils Relative: 0 %
EOS ABS: 0 10*3/uL (ref 0.0–0.7)
Eosinophils Relative: 0 %
HEMATOCRIT: 47 % — AB (ref 36.0–46.0)
Hemoglobin: 15 g/dL (ref 12.0–15.0)
Immature Granulocytes: 0 %
LYMPHS ABS: 0.9 10*3/uL (ref 0.7–4.0)
Lymphocytes Relative: 11 %
MCH: 31.4 pg (ref 26.0–34.0)
MCHC: 31.9 g/dL (ref 30.0–36.0)
MCV: 98.3 fL (ref 78.0–100.0)
MONOS PCT: 7 %
Monocytes Absolute: 0.6 10*3/uL (ref 0.1–1.0)
Neutro Abs: 6.6 10*3/uL (ref 1.7–7.7)
Neutrophils Relative %: 82 %
Platelets: 332 10*3/uL (ref 150–400)
RBC: 4.78 MIL/uL (ref 3.87–5.11)
RDW: 13.1 % (ref 11.5–15.5)
WBC: 8.2 10*3/uL (ref 4.0–10.5)

## 2017-11-08 LAB — URINALYSIS, ROUTINE W REFLEX MICROSCOPIC
Bilirubin Urine: NEGATIVE
GLUCOSE, UA: NEGATIVE mg/dL
HGB URINE DIPSTICK: NEGATIVE
KETONES UR: NEGATIVE mg/dL
LEUKOCYTES UA: NEGATIVE
Nitrite: NEGATIVE
PROTEIN: NEGATIVE mg/dL
Specific Gravity, Urine: 1.01 (ref 1.005–1.030)
pH: 7 (ref 5.0–8.0)

## 2017-11-08 LAB — COMPREHENSIVE METABOLIC PANEL
ALT: 30 U/L (ref 0–44)
ANION GAP: 13 (ref 5–15)
AST: 33 U/L (ref 15–41)
Albumin: 4.3 g/dL (ref 3.5–5.0)
Alkaline Phosphatase: 100 U/L (ref 38–126)
BILIRUBIN TOTAL: 0.9 mg/dL (ref 0.3–1.2)
BUN: 11 mg/dL (ref 8–23)
CO2: 23 mmol/L (ref 22–32)
Calcium: 9.8 mg/dL (ref 8.9–10.3)
Chloride: 102 mmol/L (ref 98–111)
Creatinine, Ser: 0.92 mg/dL (ref 0.44–1.00)
GFR, EST NON AFRICAN AMERICAN: 57 mL/min — AB (ref >60)
Glucose, Bld: 138 mg/dL — ABNORMAL HIGH (ref 70–99)
POTASSIUM: 4.1 mmol/L (ref 3.5–5.1)
Sodium: 138 mmol/L (ref 135–145)
TOTAL PROTEIN: 7.7 g/dL (ref 6.5–8.1)

## 2017-11-08 LAB — LIPASE, BLOOD: LIPASE: 17 U/L (ref 11–51)

## 2017-11-08 MED ORDER — ALBUTEROL SULFATE (2.5 MG/3ML) 0.083% IN NEBU: 2.5000 mg | INHALATION_SOLUTION | RESPIRATORY_TRACT | Status: DC | PRN

## 2017-11-08 MED ORDER — ENOXAPARIN SODIUM 40 MG/0.4ML ~~LOC~~ SOLN
40.0000 mg | SUBCUTANEOUS | Status: DC
  Administered 2017-11-08 – 2017-11-10 (×3): 40 mg via SUBCUTANEOUS
  Filled 2017-11-08 (×3): qty 0.4

## 2017-11-08 MED ORDER — LORAZEPAM 1 MG PO TABS
0.5000 mg | ORAL_TABLET | Freq: Four times a day (QID) | ORAL | Status: DC | PRN
  Administered 2017-11-09: 1 mg via ORAL
  Filled 2017-11-08: qty 1

## 2017-11-08 MED ORDER — METOCLOPRAMIDE HCL 5 MG/ML IJ SOLN
10.0000 mg | Freq: Four times a day (QID) | INTRAMUSCULAR | Status: DC | PRN
  Administered 2017-11-08 – 2017-11-10 (×3): 10 mg via INTRAVENOUS
  Filled 2017-11-08 (×3): qty 2

## 2017-11-08 MED ORDER — ONDANSETRON HCL 4 MG/2ML IJ SOLN
4.0000 mg | Freq: Once | INTRAMUSCULAR | Status: AC
  Administered 2017-11-08: 4 mg via INTRAVENOUS
  Filled 2017-11-08: qty 2

## 2017-11-08 MED ORDER — HYDROMORPHONE HCL 1 MG/ML IJ SOLN
0.5000 mg | Freq: Once | INTRAMUSCULAR | Status: AC
  Administered 2017-11-08: 0.5 mg via INTRAVENOUS
  Filled 2017-11-08: qty 1

## 2017-11-08 MED ORDER — ACETAMINOPHEN 325 MG PO TABS
650.0000 mg | ORAL_TABLET | Freq: Four times a day (QID) | ORAL | Status: DC | PRN
  Administered 2017-11-11: 650 mg via ORAL
  Filled 2017-11-08: qty 2

## 2017-11-08 MED ORDER — PROMETHAZINE HCL 25 MG/ML IJ SOLN
12.5000 mg | Freq: Four times a day (QID) | INTRAMUSCULAR | Status: DC | PRN
  Administered 2017-11-08 – 2017-11-10 (×3): 12.5 mg via INTRAVENOUS
  Filled 2017-11-08 (×3): qty 1

## 2017-11-08 MED ORDER — GABAPENTIN 300 MG PO CAPS
300.0000 mg | ORAL_CAPSULE | Freq: Three times a day (TID) | ORAL | Status: DC
  Administered 2017-11-09 – 2017-11-11 (×5): 300 mg via ORAL
  Filled 2017-11-08 (×8): qty 1

## 2017-11-08 MED ORDER — PROMETHAZINE HCL 25 MG/ML IJ SOLN
12.5000 mg | Freq: Once | INTRAMUSCULAR | Status: AC
  Administered 2017-11-08: 12.5 mg via INTRAVENOUS
  Filled 2017-11-08: qty 1

## 2017-11-08 MED ORDER — MORPHINE SULFATE (PF) 4 MG/ML IV SOLN
4.0000 mg | Freq: Once | INTRAVENOUS | Status: AC
  Administered 2017-11-08: 4 mg via INTRAVENOUS
  Filled 2017-11-08: qty 1

## 2017-11-08 MED ORDER — HYDROMORPHONE HCL 1 MG/ML IJ SOLN
0.5000 mg | INTRAMUSCULAR | Status: DC | PRN
  Administered 2017-11-08 – 2017-11-10 (×4): 0.5 mg via INTRAVENOUS
  Filled 2017-11-08 (×4): qty 0.5

## 2017-11-08 MED ORDER — ONDANSETRON HCL 4 MG/2ML IJ SOLN
INTRAMUSCULAR | Status: AC
  Filled 2017-11-08: qty 2

## 2017-11-08 MED ORDER — ONDANSETRON HCL 4 MG/2ML IJ SOLN
4.0000 mg | Freq: Once | INTRAMUSCULAR | Status: AC
  Administered 2017-11-08: 4 mg via INTRAMUSCULAR

## 2017-11-08 MED ORDER — SODIUM CHLORIDE 0.9 % IV BOLUS
1000.0000 mL | Freq: Once | INTRAVENOUS | Status: AC
  Administered 2017-11-08: 1000 mL via INTRAVENOUS

## 2017-11-08 MED ORDER — SODIUM CHLORIDE 0.9 % IV SOLN
INTRAVENOUS | Status: DC
  Administered 2017-11-08 – 2017-11-10 (×6): via INTRAVENOUS

## 2017-11-08 MED ORDER — METOCLOPRAMIDE HCL 5 MG/ML IJ SOLN
10.0000 mg | Freq: Once | INTRAMUSCULAR | Status: AC
  Administered 2017-11-08: 10 mg via INTRAVENOUS
  Filled 2017-11-08: qty 2

## 2017-11-08 MED ORDER — METHOCARBAMOL 1000 MG/10ML IJ SOLN
500.0000 mg | Freq: Four times a day (QID) | INTRAVENOUS | Status: DC | PRN
  Filled 2017-11-08: qty 5

## 2017-11-08 MED ORDER — IPRATROPIUM-ALBUTEROL 0.5-2.5 (3) MG/3ML IN SOLN: 3.0000 mL | Freq: Four times a day (QID) | RESPIRATORY_TRACT | Status: DC | PRN

## 2017-11-08 MED ORDER — ACETAMINOPHEN 650 MG RE SUPP: 650.0000 mg | Freq: Four times a day (QID) | RECTAL | Status: DC | PRN

## 2017-11-08 MED ORDER — ONDANSETRON HCL 4 MG/2ML IJ SOLN
4.0000 mg | Freq: Three times a day (TID) | INTRAMUSCULAR | Status: DC
  Administered 2017-11-08 – 2017-11-10 (×6): 4 mg via INTRAVENOUS
  Filled 2017-11-08 (×6): qty 2

## 2017-11-08 MED ORDER — LIDOCAINE 5 % EX PTCH
1.0000 | MEDICATED_PATCH | CUTANEOUS | Status: DC
Start: 2017-11-08 — End: 2017-11-11
  Administered 2017-11-08 – 2017-11-10 (×3): 1 via TRANSDERMAL
  Filled 2017-11-08 (×3): qty 1

## 2017-11-08 NOTE — Discharge Instructions (Signed)
Please go to emergency room for further evaluation.

## 2017-11-08 NOTE — ED Triage Notes (Signed)
Pt here for vomiting and back pain; pt is moaning in pain and dry heaving

## 2017-11-08 NOTE — ED Notes (Signed)
Pt husband reports pt has constant N/V since taking robaxin.

## 2017-11-08 NOTE — ED Triage Notes (Signed)
Patient reports severe lower back pain with vomiting that started this am. Patient grunting and appears restless on arrival. States that she had procedure for fx in back by IR on friday

## 2017-11-08 NOTE — ED Provider Notes (Signed)
[facility] EMERGENCY DEPARTMENT Provider Note   CSN: [id number] Arrival date & time: 11/08/17  1048     History   Chief Complaint No chief complaint on file.   HPI [name] [name] is a 82 y.o. female.  Patient is an 81 year old female with a history of coronary artery disease, chronic back pain with recent vertebroplasty on Friday, hiatal hernia status post fundoplication, chronic kidney disease who is presenting today with persistent nausea and vomiting since 5:00 last night.  Patient states that she was feeling better after vertebroplasty but then started having severe back pain on Sunday.  She was seen in the emergency department and given baclofen which she started yesterday and then started vomiting shortly after taking it.  She complains of mild diffuse soreness of her abdomen which she relates to ongoing vomiting.  She also is having severe back pain in the same area where she had the surgery.  She denies any weakness or numbness going down her legs.  She has not had a fever that she is aware of.  She denies any chest pain but states it is painful to take a deep breath.  Initially was seen at urgent care and sent here for further evaluation.  The history is provided by the patient and the spouse.    Past Medical History:  Diagnosis Date  . Anxiety    takes Xanax daily as needed  . Asthma    Albuterol daily as needed  . Blood transfusion without reported diagnosis   . CAD (coronary artery disease)    LHC 2003 with 40% pLAD, 30% mLAD, 100% D1 (moderate vessel), 100%  D2 (small vessel), 95% mid small OM2.  Pt had PTCA to D1;  D2 and OM2 small vessels and tx medically;  Last Myoview (2012): anterior infarct seen with scar, no ischemia. EF normal. Pt managed medically;  Lexiscan Myoview (12/14):  Low risk; ant scar with very mild peri-infarct ischemia; EF 55% with ant AK   . Cataract   . Chronic back pain    HNP, DDD, spinal stenosis, vertebral compression  fractures.   . Chronic nausea    takes Zofran daily as needed.  normal gastric emptying study 03/2013  . CKD (chronic kidney disease), stage III ([place]) 09/27/2017  . Constipation    felt to be functional. takes Senokot and Miralax daily.  treated with Linzess in past.   . DIVERTICULOSIS, COLON 08/28/2008   Qualifier: Diagnosis of  By: [name], [name], [name]    . Elevated LFTs 2015   probably med induced DILI, from Augmentin vs Pravastatin  . GERD (gastroesophageal reflux disease)    hx of esophageal stricture   . Hiatal hernia    Paraesophageal hernias. s/p fundoplications in 8127 and 04/2013  . History of bronchitis several of yrs ago  . History of colon polyps 03/2009, 03/2011   adenomatous colon polyps, no high grade dysplasia.   [name] History of vertigo    no meds  . Hyperlipidemia    takes Pravastatin daily  . Hypertension    takes Amlodipine,Metoprolol,and Losartan daily  . Nausea 03/2016  . Osteoporosis   . PONV (postoperative nausea and vomiting)    yrs ago  . Slow urinary stream    takes Rapaflo daily  . TIA (transient ischemic attack) 1995   no residual effects noted    Patient Active Problem List   Diagnosis Date Noted  . CKD (chronic kidney disease), stage III ([place]) 09/27/2017  . Low back  pain 09/26/2017  . T12 compression fracture ([place]) 09/26/2017  . Chronic bronchitis ([place]) 08/09/2017  . Atherosclerosis of aorta ([place]) 04/05/2017  . Costochondritis 11/14/2016  . Anxiety state 06/08/2016  . History of lumbar spinal fusion 04/13/2016  . Spondylolisthesis, lumbar region 04/13/2016  . Leukocytosis 03/28/2016  . Dysphagia   . Lumbar radiculopathy, acute 12/07/2015    Class: Chronic  . Spinal stenosis, lumbar region, with neurogenic claudication 05/29/2015  . DDD (degenerative disc disease), lumbar 04/21/2015  . Mixed hyperlipidemia 11/24/2014  . GERD  11/24/2014  . Depression, major, in partial remission ([place]) 09/26/2014  . Chronic pain syndrome 09/26/2014  .  Coronary artery disease due to lipid rich plaque   . Osteoporosis 02/10/2014  . Prediabetes 08/14/2013  . Vitamin D deficiency 08/14/2013  . Medication management 08/14/2013  . Constipation, chronic 04/03/2013  . MGUS (monoclonal gammopathy of unknown significance) 03/05/2013  . Herniated lumbar intervertebral disc 06/25/2012    Class: Acute  . Vertebral compression fracture ([place]) 06/23/2012  . ESOPHAGEAL STRICTURE 08/28/2008  . Mild intermittent asthma without complication 96/08/5407  . Incarcerated recurrent paraesophageal hiatal hernia s/p lap redo repair [id number] 01/31/2008  . Essential hypertension 07/25/2007    Past Surgical History:  Procedure Laterality Date  . APPENDECTOMY     patient unsure of date  . BACK SURGERY    . BLADDER REPAIR    . CHOLECYSTECTOMY     Patient unsure of date.  . CORONARY ANGIOPLASTY  11/16/2001   1 stent  . ESOPHAGEAL MANOMETRY N/A 03/25/2013   Procedure: ESOPHAGEAL MANOMETRY (EM);  Surgeon: Inda Castle, MD;  Location: [facility] ENDOSCOPY;  Service: Endoscopy;  Laterality: N/A;  . ESOPHAGOGASTRODUODENOSCOPY N/A 03/15/2013   Procedure: ESOPHAGOGASTRODUODENOSCOPY (EGD);  Surgeon: Jerene Bears, MD;  Location: [facility] ENDOSCOPY;  Service: Endoscopy;  Laterality: N/A;  . ESOPHAGOGASTRODUODENOSCOPY N/A 12/25/2015   Procedure: ESOPHAGOGASTRODUODENOSCOPY (EGD);  Surgeon: Doran Stabler, MD;  Location: [facility] ENDOSCOPY;  Service: Endoscopy;  Laterality: N/A;  . EYE SURGERY  11/2000   bilateral cataracts with lens implant  . FIXATION KYPHOPLASTY LUMBAR SPINE  X 2   "L3-4"  . HEMORRHOID SURGERY    . HIATAL HERNIA REPAIR  06/03/2011   Procedure: LAPAROSCOPIC REPAIR OF HIATAL HERNIA;  Surgeon: Adin Hector, MD;  Location: [facility] ORS;  Service: General;  Laterality: N/A;  . HIATAL HERNIA REPAIR N/A 04/24/2013   Procedure: LAPAROSCOPIC  REPAIR RECURRENT PARASOPHAGEAL HIATAL HERNIA WITH FUNDOPLICATION;  Surgeon: Adin Hector, MD;  Location: [facility] ORS;  Service: General;   Laterality: N/A;  . INSERTION OF MESH N/A 04/24/2013   Procedure: INSERTION OF MESH;  Surgeon: Adin Hector, MD;  Location: [facility] ORS;  Service: General;  Laterality: N/A;  . IR KYPHO THORACIC WITH BONE BIOPSY  09/29/2017  . IR KYPHO THORACIC WITH BONE BIOPSY  11/03/2017  . LAPAROSCOPIC LYSIS OF ADHESIONS N/A 04/24/2013   Procedure: LAPAROSCOPIC LYSIS OF ADHESIONS;  Surgeon: Adin Hector, MD;  Location: [facility] ORS;  Service: General;  Laterality: N/A;  . LAPAROSCOPIC NISSEN FUNDOPLICATION  01/05/5620   Procedure: LAPAROSCOPIC NISSEN FUNDOPLICATION;  Surgeon: Adin Hector, MD;  Location: [facility] ORS;  Service: General;  Laterality: N/A;  . LUMBAR FUSION  12/07/2015   Right L2-3 facetectomy, posterolateral fusion L2-3 with pedicle screws, rods, local bone graft, Vivigen cancellous chips. Fusion extended to the L1 level with pedicle screws and rods  . LUMBAR LAMINECTOMY/DECOMPRESSION MICRODISCECTOMY Right 06/26/2012   Procedure: LUMBAR LAMINECTOMY/DECOMPRESSION MICRODISCECTOMY;  Surgeon: Jessy Oto, MD;  Location:  [facility] OR;  Service: Orthopedics;  Laterality: Right;  RIGHT L4-5 MICRODISCECTOMY  . LUMBAR LAMINECTOMY/DECOMPRESSION MICRODISCECTOMY N/A 05/29/2015   Procedure: RIGHT L2-3 MICRODISCECTOMY;  Surgeon: Jessy Oto, MD;  Location: [facility];  Service: Orthopedics;  Laterality: N/A;  . TOTAL ABDOMINAL HYSTERECTOMY  1975   partial     OB History   None      Home Medications    Prior to Admission medications   Medication Sig Start Date End Date Taking? Authorizing Provider  acetaminophen (TYLENOL) 500 MG tablet Take 1,000 mg by mouth every 6 (six) hours as needed for mild pain.    [provider]  albuterol (PROAIR HFA) 108 (90 Base) MCG/ACT inhaler Inhale 2 puffs into the lungs every 6 (six) hours as needed for wheezing or shortness of breath. 09/22/16   Vicie Mutters, PA-C  ALPRAZolam Duanne Moron) 0.5 MG tablet Take 1/2 to 1 tablet 2 to 3 x / day ONLY if Anxiety Attack and please try to  limit to 5 days /week to avoid addiction Patient taking differently: Take 0.25-0.5 mg by mouth 3 (three) times daily as needed for anxiety.  08/14/17   [name], MD  amLODipine (NORVASC) 10 MG tablet TAKE 1 TABLET BY MOUTH AT BEDTIME 09/08/17   [name], MD  Cholecalciferol (VITAMIN D3) 5000 units CAPS Take 5,000 Units by mouth 2 (two) times daily.     [provider]  diclofenac sodium (VOLTAREN) 1 % GEL APPLY 4 G TOPICALLY FOUR TIMES DAILY Patient taking differently: APPLY 4 G TOPICALLY FOUR TIMES DAILY AS NEEDED 11/03/17   Jessy Oto, MD  dicyclomine (BENTYL) 10 MG capsule TAKE 1 CAPSULE BY MOUTH EVERY 8 HOURS AS NEEDED FOR SPASM 08/04/17   Armbruster, Carlota Raspberry, MD  gabapentin (NEURONTIN) 300 MG capsule TAKE 1 CAPSULE BY MOUTH THREE TIMES DAILY FOR  CHRONIC  PAIN. Patient not taking: Reported on 11/06/2017 10/06/17   [name], MD  ipratropium-albuterol (DUONEB) 0.5-2.5 (3) MG/3ML SOLN USE ONE VIAL IN NEBULIZER 4 TIMES DAILY Patient taking differently: Take 3 mLs by nebulization every 6 (six) hours as needed (wheezing). USE ONE VIAL IN NEBULIZER 4 TIMES DAILY as needed 08/09/17   [name], NP  ketoconazole (NIZORAL) 2 % cream APPLY 1 APPLICATION TOPICALLY TWO TIMES DAILY Patient taking differently: APPLY 1 APPLICATION TOPICALLY TWO TIMES DAILY AS NEEDED FOR IRRITATION 02/10/17   Vicie Mutters, PA-C  losartan (COZAAR) 100 MG tablet TAKE ONE TABLET BY MOUTH ONCE DAILY (DISCONTINUE  BENICAR) 06/22/17   [name], MD  meclizine (ANTIVERT) 25 MG tablet 1/2-1 pill up to 3 times daily for motion sickness/dizziness Patient taking differently: Take 12.5-25 mg by mouth 3 (three) times daily as needed for dizziness. 1/2-1 pill up to 3 times daily for motion sickness/dizziness 06/08/16   Vicie Mutters, PA-C  methocarbamol (ROBAXIN) 500 MG tablet Take 1 tablet (500 mg total) by mouth every 6 (six) hours as needed for muscle spasms. 11/07/17   Prescilla Sours, MD    metoprolol tartrate (LOPRESSOR) 50 MG tablet Take 50 mg by mouth at bedtime.    [provider]  mometasone-formoterol (DULERA) 100-5 MCG/ACT AERO Inhale 2 puffs into the lungs every 12 (twelve) hours. Patient taking differently: Inhale 2 puffs into the lungs every 12 (twelve) hours as needed for wheezing or shortness of breath.  09/08/17   [name], MD  neomycin-polymyxin b-dexamethasone (MAXITROL) 3.5-10000-0.1 OINT APPLY TO STYE FOUR TIMES DAILY. Patient taking differently: Place 1 application into both eyes See  admin instructions. APPLY TO STYE FOUR TIMES DAILY AS NEEDED FOR EYE STYE 07/07/17   Vicie Mutters, PA-C  nitroGLYCERIN (NITROSTAT) 0.4 MG SL tablet Place 1 tablet (0.4 mg total) under the tongue every 5 (five) minutes as needed for chest pain. 09/04/14   Larey Dresser, MD  ondansetron (ZOFRAN-ODT) 8 MG disintegrating tablet DISSOLVE ONE TABLET IN MOUTH EVERY 8 HOURS AS NEEDED FOR NAUSEA AND  VOMITING 10/05/17   [name], NP  OVER THE COUNTER MEDICATION Take 1,000 mg by mouth 2 (two) times daily. [place] Calcium 1000mg     [provider]  oxyCODONE-acetaminophen (PERCOCET/ROXICET) 5-325 MG tablet Take 1 tablet by mouth every 4 (four) hours as needed for severe pain. 03/10/17   Tegeler, Gwenyth Allegra, MD  polyethylene glycol powder (GLYCOLAX/MIRALAX) powder Take 17 g by mouth 2 (two) times daily. Patient taking differently: Take 17 g by mouth 2 (two) times daily as needed for mild constipation or moderate constipation.  05/16/17   Armbruster, Carlota Raspberry, MD  pravastatin (PRAVACHOL) 40 MG tablet Take 1 tablet (40 mg total) by mouth daily. 07/31/17   [name], MD  promethazine (PHENERGAN) 25 MG tablet Take 1 tablet (25 mg total) by mouth every 6 (six) hours as needed for nausea or vomiting. 09/04/17   [name], MD  senna-docusate (SENOKOT-S) 8.6-50 MG tablet Take 2 tablets by mouth 2 (two) times daily. 12/26/15   Roxan Hockey, MD  silodosin (RAPAFLO) 8  MG CAPS capsule Take 8 mg by mouth daily with breakfast.     [provider]    Family History Family History  Problem Relation Age of Onset  . Colon cancer Mother 79  . Heart disease Father 47       heart attack  . Brain cancer Brother        tumor   . Heart disease Brother   . Heart disease Brother   . Kidney disease Daughter     Social History Social History   Tobacco Use  . Smoking status: Never Smoker  . Smokeless tobacco: Never Used  Substance Use Topics  . Alcohol use: No  . Drug use: No     Allergies   Ace inhibitors; Aspartame; Atorvastatin; Biaxin [clarithromycin]; Daliresp [roflumilast]; Erythromycin; and Levofloxacin   Review of Systems Review of Systems  All other systems reviewed and are negative.    Physical Exam Updated Vital Signs BP (!) 158/119   Pulse 92   Temp 98.4 F (36.9 C) (Oral)   Resp (!) 22   SpO2 97%   Physical Exam  Constitutional: She is oriented to person, place, and time. She appears well-developed and well-nourished. She appears distressed.  Patient looks uncomfortable and in pain  HENT:  Head: Normocephalic and atraumatic.  Mouth/Throat: Oropharynx is clear and moist.  Eyes: Pupils are equal, round, and reactive to light. Conjunctivae and EOM are normal.  Neck: Normal range of motion. Neck supple.  Cardiovascular: Normal rate, regular rhythm and intact distal pulses.  No murmur heard. Pulmonary/Chest: Effort normal and breath sounds normal. No respiratory distress. She has no wheezes. She has no rales.  Tachypnea with short shallow breaths  Abdominal: Soft. She exhibits no distension. There is no tenderness. There is no rebound and no guarding.  Musculoskeletal: Normal range of motion. She exhibits no edema.       Lumbar back: She exhibits tenderness and bony tenderness.       Back:  Neurological: She is alert and oriented to person, place, and time.  She has normal strength. No sensory deficit.  Skin: Skin is  warm and dry. No rash noted. No erythema.  Psychiatric: She has a normal mood and affect. Her behavior is normal.  Nursing note and vitals reviewed.    ED Treatments / Results  Labs (all labs ordered are listed, but only abnormal results are displayed) Labs Reviewed  CBC WITH DIFFERENTIAL/PLATELET - Abnormal; Notable for the following components:      Result Value   HCT 47.0 (*)    All other components within normal limits  COMPREHENSIVE METABOLIC PANEL - Abnormal; Notable for the following components:   Glucose, Bld 138 (*)    GFR calc non Af Amer 57 (*)    All other components within normal limits  LIPASE, BLOOD    EKG EKG Interpretation  Date/Time:  Wednesday November 08 2017 11:31:40 EDT Ventricular Rate:  73 PR Interval:    QRS Duration: 80 QT Interval:  530 QTC Calculation: 585 R Axis:   -15 Text Interpretation:  Sinus rhythm Probable left atrial enlargement Borderline left axis deviation Low voltage, precordial leads Probable anteroseptal infarct, old Prolonged QT interval Baseline wander in lead(s) V1 No significant change since last tracing Confirmed by Blanchie Dessert [phone]) on 11/08/2017 11:35:02 AM Also confirmed by Blanchie Dessert [phone]), editor Lynder Parents [phone])  on 11/08/2017 3:05:44 PM   Radiology Dg Thoracic Spine 2 View  Result Date: 11/06/2017 CLINICAL DATA:  Worsening right-sided back pain EXAM: THORACIC SPINE 2 VIEWS COMPARISON:  Chest x-ray 09/26/2017, MRI 12/13/2016, radiograph 12/06/2016 MRI 10/10/2017 FINDINGS: Minimal kyphosis at the thoracolumbar junction. Post augmentation changes at T11 and T12, findings at T11 are new since radiograph 10/26/2017. Partially visualized lumbar spine hardware. Remaining vertebral body heights demonstrate normal stature. IMPRESSION: Post augmentation changes at T11 and T12. Remaining thoracic vertebral bodies show no acute interval change compared to prior radiograph Electronically Signed   By: Donavan Foil M.D.    On: 11/06/2017 19:04   Dg Lumbar Spine Complete  Result Date: 11/06/2017 CLINICAL DATA:  Back pain EXAM: LUMBAR SPINE - COMPLETE 4+ VIEW COMPARISON:  10/26/2017, 09/26/2017 and 10/10/2017 MRI FINDINGS: Surgical clips in the upper abdomen. Posterior stabilization rods and fixating screws L1 through L3. Multiple compression fractures L1 through L4 with post augmentation changes at L2-L3 and L4. Similar appearance of post augmentation changes at T12. New post augmentation changes at T11. Marked degenerative change at L5-S1. Small amount of radiopaque material in the distal colon. IMPRESSION: 1. Interval post augmentation changes at T11 since 10/26/2017 radiograph. Multiple chronic compression 6 L1 through L4 with postsurgical changes L1 through L3. 2. No definite acute interval change radiographically since most recent prior radiograph. Electronically Signed   By: Donavan Foil M.D.   On: 11/06/2017 19:08   Mr Thoracic Spine Wo Contrast  Result Date: 11/07/2017 CLINICAL DATA:  Right-sided thoracic back pain. Recent T11 vertebral augmentation. EXAM: MRI THORACIC AND LUMBAR SPINE WITHOUT CONTRAST TECHNIQUE: Multiplanar and multiecho pulse sequences of the thoracic and lumbar spine were obtained without intravenous contrast. COMPARISON:  Lumbar spine MRI 10/10/2017 FINDINGS: MRI THORACIC SPINE FINDINGS Alignment:  Physiologic. Vertebrae: There is compression deformity with less than 25% height loss at T11 and approximately 25% height loss at T12. Findings of recent vertebral augmentation. The appearance of T12 is unchanged compared to the 10/10/2017 MRI. The T11 augmentation is new compared to prior studies. There is diffuse marrow edema at both levels. Cord:  Normal signal and morphology. Paraspinal and other soft tissues: Mild edema in  the posterior paraspinal soft tissues at T11 consistent with recent vertebral augmentation. Disc levels: No disc herniation or stenosis. MRI LUMBAR SPINE FINDINGS Segmentation:   Standard. Alignment:  Grade 1 anterolisthesis at L4-L5. Vertebrae: L1-L3 PLIF. Chronic compression deformities at the L1-L4 levels are unchanged. There are findings compatible with multilevel vertebral augmentation, also unchanged. No new compression fracture. No discitis. Conus medullaris and cauda equina: Conus extends to the L1 level. Conus and cauda equina appear normal. Paraspinal and other soft tissues: No retroperitoneal abnormality. Disc levels: L1-L2: Postfusion changes.  No stenosis. L2-L3: Postfusion changes. Small disc osteophyte complex and facet hypertrophy with mild narrowing of the spinal canal. Mild bilateral foraminal stenosis. L3-L4: Moderate facet hypertrophy with mild bilateral foraminal narrowing. No spinal canal stenosis. L4-L5: Grade 1 anterolisthesis. Posterior decompression. No spinal canal stenosis. Mild left foraminal stenosis. L5-S1: Disc space narrowing and endplate ridging. No spinal canal stenosis. Moderate bilateral neural foraminal stenosis, unchanged. IMPRESSION: MR THORACIC SPINE IMPRESSION 1. New T11 vertebral augmentation since the prior study. Diffuse marrow edema at T11 and T12, which may be posttraumatic or post procedural. No height loss progression at T12. 2. No thoracic spinal canal stenosis. MR LUMBAR SPINE IMPRESSION 1. Unchanged appearance of L1-L4 compression deformities with post vertebral augmentation changes. No new compression fracture. 2. Unchanged mild L2-3 spinal canal stenosis. 3. Unchanged multilevel mild-to-moderate foraminal stenosis, greatest at L5-S1. Electronically Signed   By: Ulyses Jarred M.D.   On: 11/07/2017 00:59   Mr Lumbar Spine Wo Contrast  Result Date: 11/07/2017 CLINICAL DATA:  Right-sided thoracic back pain. Recent T11 vertebral augmentation. EXAM: MRI THORACIC AND LUMBAR SPINE WITHOUT CONTRAST TECHNIQUE: Multiplanar and multiecho pulse sequences of the thoracic and lumbar spine were obtained without intravenous contrast. COMPARISON:   Lumbar spine MRI 10/10/2017 FINDINGS: MRI THORACIC SPINE FINDINGS Alignment:  Physiologic. Vertebrae: There is compression deformity with less than 25% height loss at T11 and approximately 25% height loss at T12. Findings of recent vertebral augmentation. The appearance of T12 is unchanged compared to the 10/10/2017 MRI. The T11 augmentation is new compared to prior studies. There is diffuse marrow edema at both levels. Cord:  Normal signal and morphology. Paraspinal and other soft tissues: Mild edema in the posterior paraspinal soft tissues at T11 consistent with recent vertebral augmentation. Disc levels: No disc herniation or stenosis. MRI LUMBAR SPINE FINDINGS Segmentation:  Standard. Alignment:  Grade 1 anterolisthesis at L4-L5. Vertebrae: L1-L3 PLIF. Chronic compression deformities at the L1-L4 levels are unchanged. There are findings compatible with multilevel vertebral augmentation, also unchanged. No new compression fracture. No discitis. Conus medullaris and cauda equina: Conus extends to the L1 level. Conus and cauda equina appear normal. Paraspinal and other soft tissues: No retroperitoneal abnormality. Disc levels: L1-L2: Postfusion changes.  No stenosis. L2-L3: Postfusion changes. Small disc osteophyte complex and facet hypertrophy with mild narrowing of the spinal canal. Mild bilateral foraminal stenosis. L3-L4: Moderate facet hypertrophy with mild bilateral foraminal narrowing. No spinal canal stenosis. L4-L5: Grade 1 anterolisthesis. Posterior decompression. No spinal canal stenosis. Mild left foraminal stenosis. L5-S1: Disc space narrowing and endplate ridging. No spinal canal stenosis. Moderate bilateral neural foraminal stenosis, unchanged. IMPRESSION: MR THORACIC SPINE IMPRESSION 1. New T11 vertebral augmentation since the prior study. Diffuse marrow edema at T11 and T12, which may be posttraumatic or post procedural. No height loss progression at T12. 2. No thoracic spinal canal stenosis. MR  LUMBAR SPINE IMPRESSION 1. Unchanged appearance of L1-L4 compression deformities with post vertebral augmentation changes. No new compression fracture. 2.  Unchanged mild L2-3 spinal canal stenosis. 3. Unchanged multilevel mild-to-moderate foraminal stenosis, greatest at L5-S1. Electronically Signed   By: Ulyses Jarred M.D.   On: 11/07/2017 00:59    Procedures Procedures (including critical care time)  Medications Ordered in ED Medications  sodium chloride 0.9 % bolus 1,000 mL (has no administration in time range)  morphine 4 MG/ML injection 4 mg (has no administration in time range)  ondansetron (ZOFRAN) injection 4 mg (has no administration in time range)     Initial Impression / Assessment and Plan / ED Course  I have reviewed the triage vital signs and the nursing notes.  Pertinent labs & imaging results that were available during my care of the patient were reviewed by me and considered in my medical decision making (see chart for details).     Presenting today with persistent vomiting for the last 18 hours.  States she is vomiting every 20 minutes.  She is having severe back pain that started after the vomiting in the setting of recent vertebroplasty for compression fractures with a history of multiple compression fractures.  She has no localized abdominal pain but states that she just sore from all the vomiting.  Taking a deep breath makes the pain worse but denies any shortness of breath.  Patient appears uncomfortable on exam but vital signs are reassuring.  Patient recently had an MRI done yesterday of her back which showed no acute findings.  Low suspicion that this is a AAA or kidney stone.  Patient symptoms started after she took baclofen in the setting of having a history of chronic nausea.  Will attempt to control pain and nausea.  Will repeat labs but given patient's recent imaging do not feel that repeat imaging of the spine is necessary.  Also given fluids.  2:01 PM Labs  without significant findings.  Initially Zofran helped with nausea but upon returning patient is currently vomiting.  Will try Reglan and continue IV fluids.  3:17 PM Continues to have back pain and vomiting.  Vital signs remained stable.  However due to intractable nausea and vomiting spite above medication feel that patient will need admission for symptom control.  Final Clinical Impressions(s) / ED Diagnoses   Final diagnoses:  Intractable vomiting with nausea, unspecified vomiting type  Acute midline thoracic back pain    ED Discharge Orders    None       Blanchie Dessert, MD 11/08/17 1519

## 2017-11-08 NOTE — H&P (Signed)
History and Physical    [name]:[id number] DOB: [birth year] DOA: 11/08/2017   PCP: [name], MD   Patient coming from:  Home    Chief Complaint: Intractable nausea, vomiting, intermittent loose stools.   HPI: [name] [name] is a 82 y.o. female with medical history significant for CAD, hypertension, hyperlipidemia, history of TIA 1995, MGUS, CKD stage III, asthma, prediabetes  osteoporosis, history of GERD/ esohageal stricture /fundoplication, diverticulosis, anxiety, and a history of multiple kyphoplasty's and vertebroplasty is for vertebral compressions, with last kyphoplasty at T11-T12 on 11/03/2017, initially successful with good resolution of her symptoms, then having severe pain on Sunday, presenting to the ED on Monday, at which time MRI showed negative results for any other fractures, discharged to home with pain medications, baclofen, now brought to the emergency department due to ongoing nausea, stating that she is vomiting every 20 minutes, vomiting, and 2 episodes of diarrhea.  She reports that started vomiting shortly after taking baclofen.  She denies any hematemesis, or any other areas of bleeding.  The patient complains of diffuse cramping, but nonspecific abdominal pain.  She passes flatus.  Denies any fever or chills, sick contacts, recent antibiotics, recent long distance trips, or food poisoning.  She denies any shortness of breath or cough.  She denies any chest pain or palpitations.  She denies any lower extremity swelling, or decreased sensation or decreased motor function of her legs.  She denies any dysuria, hematuria, or retention.  No confusion is reported.   ED Course:  BP (!) 148/76   Pulse 69   Temp 98.4 F (36.9 C) (Oral)   Resp (!) 22   SpO2 94%   At the ED, the patient received Reglan IV, IV Zofran, Phenergan, IV fluids, with some control of her symptoms.  She feels very ill. She also continues to have back pain in the lower region, which is not  new, for which she was given pain medication. Patient was afebrile, vital signs are stable, and her labs were essentially unremarkable. EKG without significant changes since the last tracing, with known possible LA E MRI of the spine, negative for acute findings.   Review of Systems:  As per HPI otherwise all other systems reviewed and are negative  Past Medical History:  Diagnosis Date  . Anxiety    takes Xanax daily as needed  . Asthma    Albuterol daily as needed  . Blood transfusion without reported diagnosis   . CAD (coronary artery disease)    LHC 2003 with 40% pLAD, 30% mLAD, 100% D1 (moderate vessel), 100%  D2 (small vessel), 95% mid small OM2.  Pt had PTCA to D1;  D2 and OM2 small vessels and tx medically;  Last Myoview (2012): anterior infarct seen with scar, no ischemia. EF normal. Pt managed medically;  Lexiscan Myoview (12/14):  Low risk; ant scar with very mild peri-infarct ischemia; EF 55% with ant AK   . Cataract   . Chronic back pain    HNP, DDD, spinal stenosis, vertebral compression fractures.   . Chronic nausea    takes Zofran daily as needed.  normal gastric emptying study 03/2013  . CKD (chronic kidney disease), stage III ([place]) 09/27/2017  . Constipation    felt to be functional. takes Senokot and Miralax daily.  treated with Linzess in past.   . DIVERTICULOSIS, COLON 08/28/2008   Qualifier: Diagnosis of  By: [name], [name], [name]    . Elevated LFTs 2015   probably med induced  DILI, from Augmentin vs Pravastatin  . GERD (gastroesophageal reflux disease)    hx of esophageal stricture   . Hiatal hernia    Paraesophageal hernias. s/p fundoplications in 4098 and 04/2013  . History of bronchitis several of yrs ago  . History of colon polyps 03/2009, 03/2011   adenomatous colon polyps, no high grade dysplasia.   [name] History of vertigo    no meds  . Hyperlipidemia    takes Pravastatin daily  . Hypertension    takes Amlodipine,Metoprolol,and Losartan daily  .  Nausea 03/2016  . Osteoporosis   . PONV (postoperative nausea and vomiting)    yrs ago  . Slow urinary stream    takes Rapaflo daily  . TIA (transient ischemic attack) 1995   no residual effects noted    Past Surgical History:  Procedure Laterality Date  . APPENDECTOMY     patient unsure of date  . BACK SURGERY    . BLADDER REPAIR    . CHOLECYSTECTOMY     Patient unsure of date.  . CORONARY ANGIOPLASTY  11/16/2001   1 stent  . ESOPHAGEAL MANOMETRY N/A 03/25/2013   Procedure: ESOPHAGEAL MANOMETRY (EM);  Surgeon: Inda Castle, MD;  Location: [facility] ENDOSCOPY;  Service: Endoscopy;  Laterality: N/A;  . ESOPHAGOGASTRODUODENOSCOPY N/A 03/15/2013   Procedure: ESOPHAGOGASTRODUODENOSCOPY (EGD);  Surgeon: Jerene Bears, MD;  Location: [facility] ENDOSCOPY;  Service: Endoscopy;  Laterality: N/A;  . ESOPHAGOGASTRODUODENOSCOPY N/A 12/25/2015   Procedure: ESOPHAGOGASTRODUODENOSCOPY (EGD);  Surgeon: Doran Stabler, MD;  Location: [facility] ENDOSCOPY;  Service: Endoscopy;  Laterality: N/A;  . EYE SURGERY  11/2000   bilateral cataracts with lens implant  . FIXATION KYPHOPLASTY LUMBAR SPINE  X 2   "L3-4"  . HEMORRHOID SURGERY    . HIATAL HERNIA REPAIR  06/03/2011   Procedure: LAPAROSCOPIC REPAIR OF HIATAL HERNIA;  Surgeon: Adin Hector, MD;  Location: [facility] ORS;  Service: General;  Laterality: N/A;  . HIATAL HERNIA REPAIR N/A 04/24/2013   Procedure: LAPAROSCOPIC  REPAIR RECURRENT PARASOPHAGEAL HIATAL HERNIA WITH FUNDOPLICATION;  Surgeon: Adin Hector, MD;  Location: [facility];  Service: General;  Laterality: N/A;  . INSERTION OF MESH N/A 04/24/2013   Procedure: INSERTION OF MESH;  Surgeon: Adin Hector, MD;  Location: [facility];  Service: General;  Laterality: N/A;  . IR KYPHO THORACIC WITH BONE BIOPSY  09/29/2017  . IR KYPHO THORACIC WITH BONE BIOPSY  11/03/2017  . LAPAROSCOPIC LYSIS OF ADHESIONS N/A 04/24/2013   Procedure: LAPAROSCOPIC LYSIS OF ADHESIONS;  Surgeon: Adin Hector, MD;  Location: [facility];   Service: General;  Laterality: N/A;  . LAPAROSCOPIC NISSEN FUNDOPLICATION  05/27/1476   Procedure: LAPAROSCOPIC NISSEN FUNDOPLICATION;  Surgeon: Adin Hector, MD;  Location: [facility];  Service: General;  Laterality: N/A;  . LUMBAR FUSION  12/07/2015   Right L2-3 facetectomy, posterolateral fusion L2-3 with pedicle screws, rods, local bone graft, Vivigen cancellous chips. Fusion extended to the L1 level with pedicle screws and rods  . LUMBAR LAMINECTOMY/DECOMPRESSION MICRODISCECTOMY Right 06/26/2012   Procedure: LUMBAR LAMINECTOMY/DECOMPRESSION MICRODISCECTOMY;  Surgeon: Jessy Oto, MD;  Location: [place];  Service: Orthopedics;  Laterality: Right;  RIGHT L4-5 MICRODISCECTOMY  . LUMBAR LAMINECTOMY/DECOMPRESSION MICRODISCECTOMY N/A 05/29/2015   Procedure: RIGHT L2-3 MICRODISCECTOMY;  Surgeon: Jessy Oto, MD;  Location: [place];  Service: Orthopedics;  Laterality: N/A;  . TOTAL ABDOMINAL HYSTERECTOMY  1975   partial    Social History Social History   Socioeconomic History  . Marital status: Married  Spouse name: [name]  . Number of children: 2  . Years of education: 30  . Highest education level: Not on file  Occupational History  . Occupation: Retired    Fish farm manager: RETIRED    Comment: worked at [employer]  . Financial resource strain: Not on file  . Food insecurity:    Worry: Not on file    Inability: Not on file  . Transportation needs:    Medical: Not on file    Non-medical: Not on file  Tobacco Use  . Smoking status: Never Smoker  . Smokeless tobacco: Never Used  Substance and Sexual Activity  . Alcohol use: No  . Drug use: No  . Sexual activity: Never  Lifestyle  . Physical activity:    Days per week: Not on file    Minutes per session: Not on file  . Stress: Not on file  Relationships  . Social connections:    Talks on phone: Not on file    Gets together: Not on file    Attends religious service: Not on file    Active member of club or  organization: Not on file    Attends meetings of clubs or organizations: Not on file    Relationship status: Not on file  . Intimate partner violence:    Fear of current or ex partner: Not on file    Emotionally abused: Not on file    Physically abused: Not on file    Forced sexual activity: Not on file  Other Topics Concern  . Not on file  Social History Narrative   Lives with husband.   Right-handed.   3 cups caffeine per day.     Allergies  Allergen Reactions  . Ace Inhibitors Cough    Per Dr Melvyn Novas pulmonology 2013  . Aspartame Diarrhea and Nausea And Vomiting  . Atorvastatin Other (See Comments)    Muscle aches  . Biaxin [Clarithromycin] Other (See Comments)    PATIENT CAN TOLERATE Z-PAK.  Is written on patient's paper chart.  [name] [Roflumilast] Nausea And Vomiting  . Erythromycin Other (See Comments)    PATIENT CAN TOLERATE Z-PAK.  It is written on patient's paper chart.  . Levofloxacin Nausea And Vomiting    Family History  Problem Relation Age of Onset  . Colon cancer Mother 75  . Heart disease Father 70       heart attack  . Brain cancer Brother        tumor   . Heart disease Brother   . Heart disease Brother   . Kidney disease Daughter        Prior to Admission medications   Medication Sig Start Date End Date Taking? Authorizing Provider  acetaminophen (TYLENOL) 500 MG tablet Take 1,000 mg by mouth every 6 (six) hours as needed for mild pain.    [provider]  albuterol (PROAIR HFA) 108 (90 Base) MCG/ACT inhaler Inhale 2 puffs into the lungs every 6 (six) hours as needed for wheezing or shortness of breath. 09/22/16   Vicie Mutters, PA-C  ALPRAZolam Duanne Moron) 0.5 MG tablet Take 1/2 to 1 tablet 2 to 3 x / day ONLY if Anxiety Attack and please try to limit to 5 days /week to avoid addiction Patient taking differently: Take 0.25-0.5 mg by mouth 3 (three) times daily as needed for anxiety.  08/14/17   [name], MD  amLODipine (NORVASC) 10  MG tablet TAKE 1 TABLET BY MOUTH AT BEDTIME 09/08/17  [name], MD  Cholecalciferol (VITAMIN D3) 5000 units CAPS Take 5,000 Units by mouth 2 (two) times daily.     [provider]  diclofenac sodium (VOLTAREN) 1 % GEL APPLY 4 G TOPICALLY FOUR TIMES DAILY Patient taking differently: APPLY 4 G TOPICALLY FOUR TIMES DAILY AS NEEDED 11/03/17   Jessy Oto, MD  dicyclomine (BENTYL) 10 MG capsule TAKE 1 CAPSULE BY MOUTH EVERY 8 HOURS AS NEEDED FOR SPASM 08/04/17   Armbruster, Carlota Raspberry, MD  gabapentin (NEURONTIN) 300 MG capsule TAKE 1 CAPSULE BY MOUTH THREE TIMES DAILY FOR  CHRONIC  PAIN. Patient not taking: Reported on 11/06/2017 10/06/17   [name], MD  ipratropium-albuterol (DUONEB) 0.5-2.5 (3) MG/3ML SOLN USE ONE VIAL IN NEBULIZER 4 TIMES DAILY Patient taking differently: Take 3 mLs by nebulization every 6 (six) hours as needed (wheezing). USE ONE VIAL IN NEBULIZER 4 TIMES DAILY as needed 08/09/17   [name], NP  ketoconazole (NIZORAL) 2 % cream APPLY 1 APPLICATION TOPICALLY TWO TIMES DAILY Patient taking differently: APPLY 1 APPLICATION TOPICALLY TWO TIMES DAILY AS NEEDED FOR IRRITATION 02/10/17   Vicie Mutters, PA-C  losartan (COZAAR) 100 MG tablet TAKE ONE TABLET BY MOUTH ONCE DAILY (DISCONTINUE  BENICAR) 06/22/17   [name], MD  meclizine (ANTIVERT) 25 MG tablet 1/2-1 pill up to 3 times daily for motion sickness/dizziness Patient taking differently: Take 12.5-25 mg by mouth 3 (three) times daily as needed for dizziness. 1/2-1 pill up to 3 times daily for motion sickness/dizziness 06/08/16   Vicie Mutters, PA-C  methocarbamol (ROBAXIN) 500 MG tablet Take 1 tablet (500 mg total) by mouth every 6 (six) hours as needed for muscle spasms. 11/07/17   Prescilla Sours, MD  metoprolol tartrate (LOPRESSOR) 50 MG tablet Take 50 mg by mouth at bedtime.    [provider]  mometasone-formoterol (DULERA) 100-5 MCG/ACT AERO Inhale 2 puffs into the lungs every 12  (twelve) hours. Patient taking differently: Inhale 2 puffs into the lungs every 12 (twelve) hours as needed for wheezing or shortness of breath.  09/08/17   [name], MD  neomycin-polymyxin b-dexamethasone (MAXITROL) 3.5-10000-0.1 OINT APPLY TO STYE FOUR TIMES DAILY. Patient taking differently: Place 1 application into both eyes See admin instructions. APPLY TO STYE FOUR TIMES DAILY AS NEEDED FOR EYE STYE 07/07/17   Vicie Mutters, PA-C  nitroGLYCERIN (NITROSTAT) 0.4 MG SL tablet Place 1 tablet (0.4 mg total) under the tongue every 5 (five) minutes as needed for chest pain. 09/04/14   Larey Dresser, MD  ondansetron (ZOFRAN-ODT) 8 MG disintegrating tablet DISSOLVE ONE TABLET IN MOUTH EVERY 8 HOURS AS NEEDED FOR NAUSEA AND  VOMITING 10/05/17   [name], NP  OVER THE COUNTER MEDICATION Take 1,000 mg by mouth 2 (two) times daily. [place] Calcium 1000mg     [provider]  oxyCODONE-acetaminophen (PERCOCET/ROXICET) 5-325 MG tablet Take 1 tablet by mouth every 4 (four) hours as needed for severe pain. 03/10/17   Tegeler, Gwenyth Allegra, MD  polyethylene glycol powder (GLYCOLAX/MIRALAX) powder Take 17 g by mouth 2 (two) times daily. Patient taking differently: Take 17 g by mouth 2 (two) times daily as needed for mild constipation or moderate constipation.  05/16/17   Armbruster, Carlota Raspberry, MD  pravastatin (PRAVACHOL) 40 MG tablet Take 1 tablet (40 mg total) by mouth daily. 07/31/17   [name], MD  promethazine (PHENERGAN) 25 MG tablet Take 1 tablet (25 mg total) by mouth every 6 (six) hours as needed for nausea or vomiting. 09/04/17  [name], MD  senna-docusate (SENOKOT-S) 8.6-50 MG tablet Take 2 tablets by mouth 2 (two) times daily. 12/26/15   Roxan Hockey, MD  silodosin (RAPAFLO) 8 MG CAPS capsule Take 8 mg by mouth daily with breakfast.     [provider]     Physical Exam:  Vitals:   11/08/17 1430 11/08/17 1530 11/08/17 1600 11/08/17 1630  BP: (!) 154/69  (!) 154/67 (!) 148/72 (!) 148/76  Pulse: 83 94 78 69  Resp: (!) 22     Temp:      TempSrc:      SpO2: 95% 97% 94% 94%   Constitutional: Ill appearing, uncomfortable, anxious,  eyes: PERRL, lids and conjunctivae normal ENMT: Mucous membranes are moist, without exudate or lesions  Neck: normal, supple, no masses, no thyromegaly Respiratory: clear to auscultation bilaterally, no wheezing, no crackles. Normal respiratory effort at the time of this writer's exam, she was tachypneic on presentation. Cardiovascular: Regular rate and rhythm,  murmur, rubs or gallops. No extremity edema. 2+ pedal pulses. No carotid bruits.  Abdomen: Soft, non tender, No hepatosplenomegaly.  Active bowel sounds Musculoskeletal: no clubbing / cyanosis. Moves all extremities.  There is tenderness in the lumbar region, no erythema noted. Skin: no jaundice, No lesions.  Neurologic: Sensation intact  Strength equal in all extremities Psychiatric:   Alert and oriented x 3.  Anxious mood.     Labs on Admission: I have personally reviewed following labs and imaging studies  CBC: Recent Labs  Lab 11/03/17 1039 11/08/17 1148  WBC 8.1 8.2  NEUTROABS  --  6.6  HGB 14.4 15.0  HCT 44.8 47.0*  MCV 98.0 98.3  PLT 316 213    Basic Metabolic Panel: Recent Labs  Lab 11/06/17 2210 11/08/17 1148  NA 139 138  K 3.8 4.1  CL 105 102  CO2 23 23  GLUCOSE 97 138*  BUN 12 11  CREATININE 0.87 0.92  CALCIUM 9.6 9.8    GFR: Estimated Creatinine Clearance: 45.8 mL/min (by C-G formula based on SCr of 0.92 mg/dL).  Liver Function Tests: Recent Labs  Lab 11/08/17 1148  AST 33  ALT 30  ALKPHOS 100  BILITOT 0.9  PROT 7.7  ALBUMIN 4.3   Recent Labs  Lab 11/08/17 1148  LIPASE 17   No results for input(s): AMMONIA in the last 168 hours.  Coagulation Profile: Recent Labs  Lab 11/03/17 1039  INR 0.92    Cardiac Enzymes: No results for input(s): CKTOTAL, CKMB, CKMBINDEX, TROPONINI in the last 168  hours.  BNP (last 3 results) No results for input(s): PROBNP in the last 8760 hours.  HbA1C: No results for input(s): HGBA1C in the last 72 hours.  CBG: No results for input(s): GLUCAP in the last 168 hours.  Lipid Profile: No results for input(s): CHOL, HDL, LDLCALC, TRIG, CHOLHDL, LDLDIRECT in the last 72 hours.  Thyroid Function Tests: No results for input(s): TSH, T4TOTAL, FREET4, T3FREE, THYROIDAB in the last 72 hours.  Anemia Panel: No results for input(s): VITAMINB12, FOLATE, FERRITIN, TIBC, IRON, RETICCTPCT in the last 72 hours.  Urine analysis:    Component Value Date/Time   COLORURINE YELLOW 10/10/2017 0729   APPEARANCEUR CLOUDY (A) 10/10/2017 0729   LABSPEC 1.011 10/10/2017 0729   PHURINE 9.0 (H) 10/10/2017 0729   GLUCOSEU NEGATIVE 10/10/2017 0729   HGBUR NEGATIVE 10/10/2017 0729   BILIRUBINUR NEGATIVE 10/10/2017 0729   KETONESUR NEGATIVE 10/10/2017 0729   PROTEINUR NEGATIVE 10/10/2017 0729   UROBILINOGEN 0.2 09/19/2014 2235  NITRITE NEGATIVE 10/10/2017 0729   LEUKOCYTESUR NEGATIVE 10/10/2017 0729    Sepsis Labs: @LABRCNTIP (procalcitonin:4,lacticidven:4) )No results found for this or any previous visit (from the past 240 hour(s)).   Radiological Exams on Admission: Dg Thoracic Spine 2 View  Result Date: 11/06/2017 CLINICAL DATA:  Worsening right-sided back pain EXAM: THORACIC SPINE 2 VIEWS COMPARISON:  Chest x-ray 09/26/2017, MRI 12/13/2016, radiograph 12/06/2016 MRI 10/10/2017 FINDINGS: Minimal kyphosis at the thoracolumbar junction. Post augmentation changes at T11 and T12, findings at T11 are new since radiograph 10/26/2017. Partially visualized lumbar spine hardware. Remaining vertebral body heights demonstrate normal stature. IMPRESSION: Post augmentation changes at T11 and T12. Remaining thoracic vertebral bodies show no acute interval change compared to prior radiograph Electronically Signed   By: Donavan Foil M.D.   On: 11/06/2017 19:04   Dg Lumbar  Spine Complete  Result Date: 11/06/2017 CLINICAL DATA:  Back pain EXAM: LUMBAR SPINE - COMPLETE 4+ VIEW COMPARISON:  10/26/2017, 09/26/2017 and 10/10/2017 MRI FINDINGS: Surgical clips in the upper abdomen. Posterior stabilization rods and fixating screws L1 through L3. Multiple compression fractures L1 through L4 with post augmentation changes at L2-L3 and L4. Similar appearance of post augmentation changes at T12. New post augmentation changes at T11. Marked degenerative change at L5-S1. Small amount of radiopaque material in the distal colon. IMPRESSION: 1. Interval post augmentation changes at T11 since 10/26/2017 radiograph. Multiple chronic compression 6 L1 through L4 with postsurgical changes L1 through L3. 2. No definite acute interval change radiographically since most recent prior radiograph. Electronically Signed   By: Donavan Foil M.D.   On: 11/06/2017 19:08   Mr Thoracic Spine Wo Contrast  Result Date: 11/07/2017 CLINICAL DATA:  Right-sided thoracic back pain. Recent T11 vertebral augmentation. EXAM: MRI THORACIC AND LUMBAR SPINE WITHOUT CONTRAST TECHNIQUE: Multiplanar and multiecho pulse sequences of the thoracic and lumbar spine were obtained without intravenous contrast. COMPARISON:  Lumbar spine MRI 10/10/2017 FINDINGS: MRI THORACIC SPINE FINDINGS Alignment:  Physiologic. Vertebrae: There is compression deformity with less than 25% height loss at T11 and approximately 25% height loss at T12. Findings of recent vertebral augmentation. The appearance of T12 is unchanged compared to the 10/10/2017 MRI. The T11 augmentation is new compared to prior studies. There is diffuse marrow edema at both levels. Cord:  Normal signal and morphology. Paraspinal and other soft tissues: Mild edema in the posterior paraspinal soft tissues at T11 consistent with recent vertebral augmentation. Disc levels: No disc herniation or stenosis. MRI LUMBAR SPINE FINDINGS Segmentation:  Standard. Alignment:  Grade 1  anterolisthesis at L4-L5. Vertebrae: L1-L3 PLIF. Chronic compression deformities at the L1-L4 levels are unchanged. There are findings compatible with multilevel vertebral augmentation, also unchanged. No new compression fracture. No discitis. Conus medullaris and cauda equina: Conus extends to the L1 level. Conus and cauda equina appear normal. Paraspinal and other soft tissues: No retroperitoneal abnormality. Disc levels: L1-L2: Postfusion changes.  No stenosis. L2-L3: Postfusion changes. Small disc osteophyte complex and facet hypertrophy with mild narrowing of the spinal canal. Mild bilateral foraminal stenosis. L3-L4: Moderate facet hypertrophy with mild bilateral foraminal narrowing. No spinal canal stenosis. L4-L5: Grade 1 anterolisthesis. Posterior decompression. No spinal canal stenosis. Mild left foraminal stenosis. L5-S1: Disc space narrowing and endplate ridging. No spinal canal stenosis. Moderate bilateral neural foraminal stenosis, unchanged. IMPRESSION: MR THORACIC SPINE IMPRESSION 1. New T11 vertebral augmentation since the prior study. Diffuse marrow edema at T11 and T12, which may be posttraumatic or post procedural. No height loss progression at T12. 2. No thoracic  spinal canal stenosis. MR LUMBAR SPINE IMPRESSION 1. Unchanged appearance of L1-L4 compression deformities with post vertebral augmentation changes. No new compression fracture. 2. Unchanged mild L2-3 spinal canal stenosis. 3. Unchanged multilevel mild-to-moderate foraminal stenosis, greatest at L5-S1. Electronically Signed   By: Ulyses Jarred M.D.   On: 11/07/2017 00:59   Mr Lumbar Spine Wo Contrast  Result Date: 11/07/2017 CLINICAL DATA:  Right-sided thoracic back pain. Recent T11 vertebral augmentation. EXAM: MRI THORACIC AND LUMBAR SPINE WITHOUT CONTRAST TECHNIQUE: Multiplanar and multiecho pulse sequences of the thoracic and lumbar spine were obtained without intravenous contrast. COMPARISON:  Lumbar spine MRI 10/10/2017  FINDINGS: MRI THORACIC SPINE FINDINGS Alignment:  Physiologic. Vertebrae: There is compression deformity with less than 25% height loss at T11 and approximately 25% height loss at T12. Findings of recent vertebral augmentation. The appearance of T12 is unchanged compared to the 10/10/2017 MRI. The T11 augmentation is new compared to prior studies. There is diffuse marrow edema at both levels. Cord:  Normal signal and morphology. Paraspinal and other soft tissues: Mild edema in the posterior paraspinal soft tissues at T11 consistent with recent vertebral augmentation. Disc levels: No disc herniation or stenosis. MRI LUMBAR SPINE FINDINGS Segmentation:  Standard. Alignment:  Grade 1 anterolisthesis at L4-L5. Vertebrae: L1-L3 PLIF. Chronic compression deformities at the L1-L4 levels are unchanged. There are findings compatible with multilevel vertebral augmentation, also unchanged. No new compression fracture. No discitis. Conus medullaris and cauda equina: Conus extends to the L1 level. Conus and cauda equina appear normal. Paraspinal and other soft tissues: No retroperitoneal abnormality. Disc levels: L1-L2: Postfusion changes.  No stenosis. L2-L3: Postfusion changes. Small disc osteophyte complex and facet hypertrophy with mild narrowing of the spinal canal. Mild bilateral foraminal stenosis. L3-L4: Moderate facet hypertrophy with mild bilateral foraminal narrowing. No spinal canal stenosis. L4-L5: Grade 1 anterolisthesis. Posterior decompression. No spinal canal stenosis. Mild left foraminal stenosis. L5-S1: Disc space narrowing and endplate ridging. No spinal canal stenosis. Moderate bilateral neural foraminal stenosis, unchanged. IMPRESSION: MR THORACIC SPINE IMPRESSION 1. New T11 vertebral augmentation since the prior study. Diffuse marrow edema at T11 and T12, which may be posttraumatic or post procedural. No height loss progression at T12. 2. No thoracic spinal canal stenosis. MR LUMBAR SPINE IMPRESSION 1.  Unchanged appearance of L1-L4 compression deformities with post vertebral augmentation changes. No new compression fracture. 2. Unchanged mild L2-3 spinal canal stenosis. 3. Unchanged multilevel mild-to-moderate foraminal stenosis, greatest at L5-S1. Electronically Signed   By: Ulyses Jarred M.D.   On: 11/07/2017 00:59    EKG: Independently reviewed.  Assessment/Plan Principal Problem:   Intractable nausea and vomiting Active Problems:   Essential hypertension   Mild intermittent asthma without complication   ESOPHAGEAL STRICTURE   Incarcerated recurrent paraesophageal hiatal hernia s/p lap redo repair [id number]   MGUS (monoclonal gammopathy of unknown significance)   Prediabetes   Depression, major, in partial remission (HCC)   Chronic pain syndrome   Mixed hyperlipidemia   GERD    Dysphagia   Anxiety state   CKD (chronic kidney disease), stage III (HCC)   Intractable nausea and vomiting with intermittent diarrhea, rule out  gastroenteritis Afebrile VSS. WBC normal, no lymphs seen on differential.Chemistries are normal No history of drug use. Recent Kyphoplasty, no antibiotics given.  Received at the emergency department and was IV fluids, IV antiemetics, IV Reglan, the pain medications, with some control but without resolution of the symptoms. Patient reports Baclofen may have exacerbated the symptoms  Of note, the patient has a  history of GERD- esophageal stricture with multiple complications, unclear if this is a role in her symptoms at this time. Admit to med surg Bowel rest, will advance as tolerated  scheduled Zofran, with breakthrough IV Phenergan  IV fluids at 125 cc/h NS with K replenishment GI panel HOld Baclofen and monitor if symptoms do improve after dc med We will hold antibiotics for now pending the GI panel HOld laxatives If symptoms are not improved, will obtain GI consultation   GERD/ esophageal stricture, sp fundoplications in the past, followed by [facility], last seen  a few weeks ago. Normal EGD 2017.For UGI in the near future  Continue PPI as above  Follow with GI as scheduled   Asthma without exacerbation Osats normal  WBC normal  Continue nebs and Oxygen   Chronic pain syndrome, in the setting of chronic back pain, no with recent kyphoplasty. Continue her home oral pain medications when able to tolerate. In the interim the patient will be receiving Dilaudid as needed, Tylenol and she will continue her Robaxin for muscle spasms.  Will DC her oral baclofen Continue Neurontin Follow with EP as outpatient  Prediabetes, blood sugar is 138 Check CBG bid    Hypertension BP 140/82   Pulse 75   Continue home anti-hypertensive medications when able to tolerate po Hold BP meds for now in view of intractable vomiting/volume loss   Hyperlipidemia Continue home statins when able   Chronic kidney disease stage 3 Current Cr is 0.92, stable  Lab Results  Component Value Date   CREATININE 0.92 11/08/2017   CREATININE 0.87 11/06/2017   CREATININE 0.90 10/10/2017   IVF Repeat BMET in am  If worsened can hold NAIDS    Anxiety Continue home Xanax when able to tolerate po Ativan IV Prn   Chronic decreased  urinary output Continue Rapaflo unable to tolerate oral  DVT prophylaxis:  Lovenox Code Status: Full     Family Communication:  Discussed with patient Disposition Plan: Expect patient to be discharged to home after condition improves Consults called:    None  Admission status: Medsurg IP    Sharene Butters, PA-C [facility]   Amion text  [phone]   11/08/2017, 4:49 PM

## 2017-11-08 NOTE — ED Provider Notes (Addendum)
[facility]    CSN: [id number] Arrival date & time: 11/08/17  1008     History   Chief Complaint Chief Complaint  Patient presents with  . Emesis    HPI [name] [name] is a 82 y.o. female history of CKD, chronic back pain, CAD presenting today for evaluation of nausea and vomiting.  Patient states that vomiting began yesterday afternoon and has been persistent since.  She has not been able to hold anything down.  She is also noted to have pain in her back.  She had a kyphoplasty of her lumbar spine performed last Friday.  She was in the emergency room on Monday evaluated for this persistent back pain and work-up was negative.  Pain attributed to her sciatica.  She started Robaxin yesterday evening, has taken 2 doses and vomiting started before taking the second dose.  She has been unable to hold down fluids with Zofran.  HPI  Past Medical History:  Diagnosis Date  . Anxiety    takes Xanax daily as needed  . Asthma    Albuterol daily as needed  . Blood transfusion without reported diagnosis   . CAD (coronary artery disease)    LHC 2003 with 40% pLAD, 30% mLAD, 100% D1 (moderate vessel), 100%  D2 (small vessel), 95% mid small OM2.  Pt had PTCA to D1;  D2 and OM2 small vessels and tx medically;  Last Myoview (2012): anterior infarct seen with scar, no ischemia. EF normal. Pt managed medically;  Lexiscan Myoview (12/14):  Low risk; ant scar with very mild peri-infarct ischemia; EF 55% with ant AK   . Cataract   . Chronic back pain    HNP, DDD, spinal stenosis, vertebral compression fractures.   . Chronic nausea    takes Zofran daily as needed.  normal gastric emptying study 03/2013  . CKD (chronic kidney disease), stage III ([place]) 09/27/2017  . Constipation    felt to be functional. takes Senokot and Miralax daily.  treated with Linzess in past.   . DIVERTICULOSIS, COLON 08/28/2008   Qualifier: Diagnosis of  By: [name], [name], [name]    . Elevated LFTs 2015   probably med  induced DILI, from Augmentin vs Pravastatin  . GERD (gastroesophageal reflux disease)    hx of esophageal stricture   . Hiatal hernia    Paraesophageal hernias. s/p fundoplications in 3532 and 04/2013  . History of bronchitis several of yrs ago  . History of colon polyps 03/2009, 03/2011   adenomatous colon polyps, no high grade dysplasia.   [name] History of vertigo    no meds  . Hyperlipidemia    takes Pravastatin daily  . Hypertension    takes Amlodipine,Metoprolol,and Losartan daily  . Nausea 03/2016  . Osteoporosis   . PONV (postoperative nausea and vomiting)    yrs ago  . Slow urinary stream    takes Rapaflo daily  . TIA (transient ischemic attack) 1995   no residual effects noted    Patient Active Problem List   Diagnosis Date Noted  . CKD (chronic kidney disease), stage III ([name]) 09/27/2017  . Low back pain 09/26/2017  . T12 compression fracture ([name]) 09/26/2017  . Chronic bronchitis ([name]) 08/09/2017  . Atherosclerosis of aorta ([place]) 04/05/2017  . Costochondritis 11/14/2016  . Anxiety state 06/08/2016  . History of lumbar spinal fusion 04/13/2016  . Spondylolisthesis, lumbar region 04/13/2016  . Leukocytosis 03/28/2016  . Dysphagia   . Lumbar radiculopathy, acute 12/07/2015    Class: Chronic  .  Spinal stenosis, lumbar region, with neurogenic claudication 05/29/2015  . DDD (degenerative disc disease), lumbar 04/21/2015  . Mixed hyperlipidemia 11/24/2014  . GERD  11/24/2014  . Depression, major, in partial remission ([name]) 09/26/2014  . Chronic pain syndrome 09/26/2014  . Coronary artery disease due to lipid rich plaque   . Osteoporosis 02/10/2014  . Prediabetes 08/14/2013  . Vitamin D deficiency 08/14/2013  . Medication management 08/14/2013  . Constipation, chronic 04/03/2013  . MGUS (monoclonal gammopathy of unknown significance) 03/05/2013  . Herniated lumbar intervertebral disc 06/25/2012    Class: Acute  . Vertebral compression fracture ([place]) 06/23/2012  .  ESOPHAGEAL STRICTURE 08/28/2008  . Mild intermittent asthma without complication 00/17/4944  . Incarcerated recurrent paraesophageal hiatal hernia s/p lap redo repair [id number] 01/31/2008  . Essential hypertension 07/25/2007    Past Surgical History:  Procedure Laterality Date  . APPENDECTOMY     patient unsure of date  . BACK SURGERY    . BLADDER REPAIR    . CHOLECYSTECTOMY     Patient unsure of date.  . CORONARY ANGIOPLASTY  11/16/2001   1 stent  . ESOPHAGEAL MANOMETRY N/A 03/25/2013   Procedure: ESOPHAGEAL MANOMETRY (EM);  Surgeon: Inda Castle, MD;  Location: [facility] END[facility]COPY;  Service: Endoscopy;  Laterality: N/A;  . ESOPHAGOGASTRODUODENOSCOPY N/A 03/15/2013   Procedure: ESOPHAGOGASTRODUODENOSCOPY (EGD);  Surgeon: Jerene Bears, MD;  Location: [facility];  Service: Endoscopy;  Laterality: N/A;  . ESOPHAGOGASTRODUODENOSCOPY N/A 12/25/2015   Procedure: ESOPHAGOGASTRODUODENOSCOPY (EGD);  Surgeon: Doran Stabler, MD;  Location: [facility]COPY;  Service: Endoscopy;  Laterality: N/A;  . EYE SURGERY  11/2000   bilateral cataracts with lens implant  . FIXATION KYPHOPLASTY LUMBAR SPINE  X 2   "L3-4"  . HEMORRHOID SURGERY    . HIATAL HERNIA REPAIR  06/03/2011   Procedure: LAPAROSCOPIC REPAIR OF HIATAL HERNIA;  Surgeon: Adin Hector, MD;  Location: [facility] ORS;  Service: General;  Laterality: N/A;  . HIATAL HERNIA REPAIR N/A 04/24/2013   Procedure: LAPAROSCOPIC  REPAIR RECURRENT PARASOPHAGEAL HIATAL HERNIA WITH FUNDOPLICATION;  Surgeon: Adin Hector, MD;  Location: [facility];  Service: General;  Laterality: N/A;  . INSERTION OF MESH N/A 04/24/2013   Procedure: INSERTION OF MESH;  Surgeon: Adin Hector, MD;  Location: [facility] OR[facility];  Service: General;  Laterality: N/A;  . IR KYPHO THORACIC WITH BONE BIOPSY  09/29/2017  . IR KYPHO THORACIC WITH BONE BIOPSY  11/03/2017  . LAPAROSCOPIC LYSIS OF ADHESIONS N/A 04/24/2013   Procedure: LAPAROSCOPIC LYSIS OF ADHESIONS;  Surgeon: Adin Hector, MD;   Location: [facility];  Service: General;  Laterality: N/A;  . LAPAROSCOPIC NISSEN FUNDOPLICATION  3/84/6659   Procedure: LAPAROSCOPIC NISSEN FUNDOPLICATION;  Surgeon: Adin Hector, MD;  Location: [facility];  Service: General;  Laterality: N/A;  . LUMBAR FUSION  12/07/2015   Right L2-3 facetectomy, posterolateral fusion L2-3 with pedicle screws, rods, local bone graft, Vivigen cancellous chips. Fusion extended to the L1 level with pedicle screws and rods  . LUMBAR LAMINECTOMY/DECOMPRESSION MICRODISCECTOMY Right 06/26/2012   Procedure: LUMBAR LAMINECTOMY/DECOMPRESSION MICRODISCECTOMY;  Surgeon: Jessy Oto, MD;  Location: [facility];  Service: Orthopedics;  Laterality: Right;  RIGHT L4-5 MICRODISCECTOMY  . LUMBAR LAMINECTOMY/DECOMPRESSION MICRODISCECTOMY N/A 05/29/2015   Procedure: RIGHT L2-3 MICRODISCECTOMY;  Surgeon: Jessy Oto, MD;  Location: [facility];  Service: Orthopedics;  Laterality: N/A;  . TOTAL ABDOMINAL HYSTERECTOMY  1975   partial    OB History   None      Home Medications  Prior to Admission medications   Medication Sig Start Date End Date Taking? Authorizing Provider  acetaminophen (TYLENOL) 500 MG tablet Take 1,000 mg by mouth every 6 (six) hours as needed for mild pain.    [provider]  albuterol (PROAIR HFA) 108 (90 Base) MCG/ACT inhaler Inhale 2 puffs into the lungs every 6 (six) hours as needed for wheezing or shortness of breath. 09/22/16   Vicie Mutters, PA-C  ALPRAZolam Duanne Moron) 0.5 MG tablet Take 1/2 to 1 tablet 2 to 3 x / day ONLY if Anxiety Attack and please try to limit to 5 days /week to avoid addiction Patient taking differently: Take 0.25-0.5 mg by mouth 3 (three) times daily as needed for anxiety.  08/14/17   [name], MD  amLODipine (NORVASC) 10 MG tablet TAKE 1 TABLET BY MOUTH AT BEDTIME 09/08/17   [name], MD  Cholecalciferol (VITAMIN D3) 5000 units CAPS Take 5,000 Units by mouth 2 (two) times daily.     [provider]    diclofenac sodium (VOLTAREN) 1 % GEL APPLY 4 G TOPICALLY FOUR TIMES DAILY Patient taking differently: APPLY 4 G TOPICALLY FOUR TIMES DAILY AS NEEDED 11/03/17   Jessy Oto, MD  dicyclomine (BENTYL) 10 MG capsule TAKE 1 CAPSULE BY MOUTH EVERY 8 HOURS AS NEEDED FOR SPASM 08/04/17   Armbruster, Carlota Raspberry, MD  gabapentin (NEURONTIN) 300 MG capsule TAKE 1 CAPSULE BY MOUTH THREE TIMES DAILY FOR  CHRONIC  PAIN. Patient not taking: Reported on 11/06/2017 10/06/17   [name], MD  ipratropium-albuterol (DUONEB) 0.5-2.5 (3) MG/3ML SOLN USE ONE VIAL IN NEBULIZER 4 TIMES DAILY Patient taking differently: Take 3 mLs by nebulization every 6 (six) hours as needed (wheezing). USE ONE VIAL IN NEBULIZER 4 TIMES DAILY as needed 08/09/17   [name], NP  ketoconazole (NIZORAL) 2 % cream APPLY 1 APPLICATION TOPICALLY TWO TIMES DAILY Patient taking differently: APPLY 1 APPLICATION TOPICALLY TWO TIMES DAILY AS NEEDED FOR IRRITATION 02/10/17   Vicie Mutters, PA-C  losartan (COZAAR) 100 MG tablet TAKE ONE TABLET BY MOUTH ONCE DAILY (DISCONTINUE  BENICAR) 06/22/17   [name], MD  meclizine (ANTIVERT) 25 MG tablet 1/2-1 pill up to 3 times daily for motion sickness/dizziness Patient taking differently: Take 12.5-25 mg by mouth 3 (three) times daily as needed for dizziness. 1/2-1 pill up to 3 times daily for motion sickness/dizziness 06/08/16   Vicie Mutters, PA-C  methocarbamol (ROBAXIN) 500 MG tablet Take 1 tablet (500 mg total) by mouth every 6 (six) hours as needed for muscle spasms. 11/07/17   Prescilla Sours, MD  metoprolol tartrate (LOPRESSOR) 50 MG tablet Take 50 mg by mouth at bedtime.    [provider]  mometasone-formoterol (DULERA) 100-5 MCG/ACT AERO Inhale 2 puffs into the lungs every 12 (twelve) hours. Patient taking differently: Inhale 2 puffs into the lungs every 12 (twelve) hours as needed for wheezing or shortness of breath.  09/08/17   [name], MD  neomycin-polymyxin  b-dexamethasone (MAXITROL) 3.5-10000-0.1 OINT APPLY TO STYE FOUR TIMES DAILY. Patient taking differently: Place 1 application into both eyes See admin instructions. APPLY TO STYE FOUR TIMES DAILY AS NEEDED FOR EYE STYE 07/07/17   Vicie Mutters, PA-C  nitroGLYCERIN (NITROSTAT) 0.4 MG SL tablet Place 1 tablet (0.4 mg total) under the tongue every 5 (five) minutes as needed for chest pain. 09/04/14   Larey Dresser, MD  ondansetron (ZOFRAN-ODT) 8 MG disintegrating tablet DISSOLVE ONE TABLET IN MOUTH EVERY 8 HOURS AS NEEDED FOR NAUSEA  AND  VOMITING 10/05/17   [name], NP  OVER THE COUNTER MEDICATION Take 1,000 mg by mouth 2 (two) times daily. [place] Calcium 1000mg     [provider]  oxyCODONE-acetaminophen (PERCOCET/ROXICET) 5-325 MG tablet Take 1 tablet by mouth every 4 (four) hours as needed for severe pain. 03/10/17   Tegeler, Gwenyth Allegra, MD  polyethylene glycol powder (GLYCOLAX/MIRALAX) powder Take 17 g by mouth 2 (two) times daily. Patient taking differently: Take 17 g by mouth 2 (two) times daily as needed for mild constipation or moderate constipation.  05/16/17   Armbruster, Carlota Raspberry, MD  pravastatin (PRAVACHOL) 40 MG tablet Take 1 tablet (40 mg total) by mouth daily. 07/31/17   [name], MD  promethazine (PHENERGAN) 25 MG tablet Take 1 tablet (25 mg total) by mouth every 6 (six) hours as needed for nausea or vomiting. 09/04/17   [name], MD  senna-docusate (SENOKOT-S) 8.6-50 MG tablet Take 2 tablets by mouth 2 (two) times daily. 12/26/15   Roxan Hockey, MD  silodosin (RAPAFLO) 8 MG CAPS capsule Take 8 mg by mouth daily with breakfast.     [provider]    Family History Family History  Problem Relation Age of Onset  . Colon cancer Mother 78  . Heart disease Father 67       heart attack  . Brain cancer Brother        tumor   . Heart disease Brother   . Heart disease Brother   . Kidney disease Daughter     Social History Social History    Tobacco Use  . Smoking status: Never Smoker  . Smokeless tobacco: Never Used  Substance Use Topics  . Alcohol use: No  . Drug use: No     Allergies   Ace inhibitors; Aspartame; Atorvastatin; Biaxin [clarithromycin]; Daliresp [roflumilast]; Erythromycin; and Levofloxacin   Review of Systems Review of Systems  Constitutional: Negative for activity change, chills, diaphoresis and fatigue.  HENT: Negative for ear pain.   Gastrointestinal: Positive for nausea and vomiting. Negative for abdominal pain and blood in stool.  Musculoskeletal: Positive for back pain and gait problem. Negative for arthralgias, myalgias, neck pain and neck stiffness.  Neurological: Positive for weakness. Negative for dizziness, light-headedness, numbness and headaches.     Physical Exam Triage Vital Signs ED Triage Vitals [11/08/17 1025]  Enc Vitals Group     BP (!) 166/111     Pulse Rate 88     Resp (!) 32     Temp 98.4 F (36.9 C)     Temp Source Oral     SpO2 98 %     Weight      Height      Head Circumference      Peak Flow      Pain Score      Pain Loc      Pain Edu?      Excl. in [place]?    No data found.  Updated Vital Signs BP (!) 166/111 (BP Location: Right Arm) Comment: Pt dry heaving and gasping for breath durring vitals  Pulse 88   Temp 98.4 F (36.9 C) (Oral)   Resp (!) 32 Comment: Pt dry heaving and gasping for breath durring vitals  SpO2 98%   Visual Acuity Right Eye Distance:   Left Eye Distance:   Bilateral Distance:    Right Eye Near:   Left Eye Near:    Bilateral Near:     Physical Exam  Constitutional: She  is oriented to person, place, and time. She appears well-developed and well-nourished.  Patient actively dry heaving in room, swaying back and forth due to pain and discomfort  HENT:  Head: Normocephalic and atraumatic.  Nose: Nose normal.  Eyes: Conjunctivae are normal.  Neck: Neck supple.  Cardiovascular: Normal rate.  Pulmonary/Chest: Effort normal.  No respiratory distress.  Abdominal: She exhibits no distension.  Musculoskeletal: Normal range of motion.  Neurological: She is alert and oriented to person, place, and time.  Skin: Skin is warm and dry.  Psychiatric: She has a normal mood and affect.  Nursing note and vitals reviewed.    UC Treatments / Results  Labs (all labs ordered are listed, but only abnormal results are displayed) Labs Reviewed - No data to display  EKG None  Radiology Dg Thoracic Spine 2 View  Result Date: 11/06/2017 CLINICAL DATA:  Worsening right-sided back pain EXAM: THORACIC SPINE 2 VIEWS COMPARISON:  Chest x-ray 09/26/2017, MRI 12/13/2016, radiograph 12/06/2016 MRI 10/10/2017 FINDINGS: Minimal kyphosis at the thoracolumbar junction. Post augmentation changes at T11 and T12, findings at T11 are new since radiograph 10/26/2017. Partially visualized lumbar spine hardware. Remaining vertebral body heights demonstrate normal stature. IMPRESSION: Post augmentation changes at T11 and T12. Remaining thoracic vertebral bodies show no acute interval change compared to prior radiograph Electronically Signed   By: Donavan Foil M.D.   On: 11/06/2017 19:04   Dg Lumbar Spine Complete  Result Date: 11/06/2017 CLINICAL DATA:  Back pain EXAM: LUMBAR SPINE - COMPLETE 4+ VIEW COMPARISON:  10/26/2017, 09/26/2017 and 10/10/2017 MRI FINDINGS: Surgical clips in the upper abdomen. Posterior stabilization rods and fixating screws L1 through L3. Multiple compression fractures L1 through L4 with post augmentation changes at L2-L3 and L4. Similar appearance of post augmentation changes at T12. New post augmentation changes at T11. Marked degenerative change at L5-S1. Small amount of radiopaque material in the distal colon. IMPRESSION: 1. Interval post augmentation changes at T11 since 10/26/2017 radiograph. Multiple chronic compression 6 L1 through L4 with postsurgical changes L1 through L3. 2. No definite acute interval change  radiographically since most recent prior radiograph. Electronically Signed   By: Donavan Foil M.D.   On: 11/06/2017 19:08   Mr Thoracic Spine Wo Contrast  Result Date: 11/07/2017 CLINICAL DATA:  Right-sided thoracic back pain. Recent T11 vertebral augmentation. EXAM: MRI THORACIC AND LUMBAR SPINE WITHOUT CONTRAST TECHNIQUE: Multiplanar and multiecho pulse sequences of the thoracic and lumbar spine were obtained without intravenous contrast. COMPARISON:  Lumbar spine MRI 10/10/2017 FINDINGS: MRI THORACIC SPINE FINDINGS Alignment:  Physiologic. Vertebrae: There is compression deformity with less than 25% height loss at T11 and approximately 25% height loss at T12. Findings of recent vertebral augmentation. The appearance of T12 is unchanged compared to the 10/10/2017 MRI. The T11 augmentation is new compared to prior studies. There is diffuse marrow edema at both levels. Cord:  Normal signal and morphology. Paraspinal and other soft tissues: Mild edema in the posterior paraspinal soft tissues at T11 consistent with recent vertebral augmentation. Disc levels: No disc herniation or stenosis. MRI LUMBAR SPINE FINDINGS Segmentation:  Standard. Alignment:  Grade 1 anterolisthesis at L4-L5. Vertebrae: L1-L3 PLIF. Chronic compression deformities at the L1-L4 levels are unchanged. There are findings compatible with multilevel vertebral augmentation, also unchanged. No new compression fracture. No discitis. Conus medullaris and cauda equina: Conus extends to the L1 level. Conus and cauda equina appear normal. Paraspinal and other soft tissues: No retroperitoneal abnormality. Disc levels: L1-L2: Postfusion changes.  No stenosis. L2-L3:  Postfusion changes. Small disc osteophyte complex and facet hypertrophy with mild narrowing of the spinal canal. Mild bilateral foraminal stenosis. L3-L4: Moderate facet hypertrophy with mild bilateral foraminal narrowing. No spinal canal stenosis. L4-L5: Grade 1 anterolisthesis. Posterior  decompression. No spinal canal stenosis. Mild left foraminal stenosis. L5-S1: Disc space narrowing and endplate ridging. No spinal canal stenosis. Moderate bilateral neural foraminal stenosis, unchanged. IMPRESSION: MR THORACIC SPINE IMPRESSION 1. New T11 vertebral augmentation since the prior study. Diffuse marrow edema at T11 and T12, which may be posttraumatic or post procedural. No height loss progression at T12. 2. No thoracic spinal canal stenosis. MR LUMBAR SPINE IMPRESSION 1. Unchanged appearance of L1-L4 compression deformities with post vertebral augmentation changes. No new compression fracture. 2. Unchanged mild L2-3 spinal canal stenosis. 3. Unchanged multilevel mild-to-moderate foraminal stenosis, greatest at L5-S1. Electronically Signed   By: Ulyses Jarred M.D.   On: 11/07/2017 00:59   Mr Lumbar Spine Wo Contrast  Result Date: 11/07/2017 CLINICAL DATA:  Right-sided thoracic back pain. Recent T11 vertebral augmentation. EXAM: MRI THORACIC AND LUMBAR SPINE WITHOUT CONTRAST TECHNIQUE: Multiplanar and multiecho pulse sequences of the thoracic and lumbar spine were obtained without intravenous contrast. COMPARISON:  Lumbar spine MRI 10/10/2017 FINDINGS: MRI THORACIC SPINE FINDINGS Alignment:  Physiologic. Vertebrae: There is compression deformity with less than 25% height loss at T11 and approximately 25% height loss at T12. Findings of recent vertebral augmentation. The appearance of T12 is unchanged compared to the 10/10/2017 MRI. The T11 augmentation is new compared to prior studies. There is diffuse marrow edema at both levels. Cord:  Normal signal and morphology. Paraspinal and other soft tissues: Mild edema in the posterior paraspinal soft tissues at T11 consistent with recent vertebral augmentation. Disc levels: No disc herniation or stenosis. MRI LUMBAR SPINE FINDINGS Segmentation:  Standard. Alignment:  Grade 1 anterolisthesis at L4-L5. Vertebrae: L1-L3 PLIF. Chronic compression deformities at  the L1-L4 levels are unchanged. There are findings compatible with multilevel vertebral augmentation, also unchanged. No new compression fracture. No discitis. Conus medullaris and cauda equina: Conus extends to the L1 level. Conus and cauda equina appear normal. Paraspinal and other soft tissues: No retroperitoneal abnormality. Disc levels: L1-L2: Postfusion changes.  No stenosis. L2-L3: Postfusion changes. Small disc osteophyte complex and facet hypertrophy with mild narrowing of the spinal canal. Mild bilateral foraminal stenosis. L3-L4: Moderate facet hypertrophy with mild bilateral foraminal narrowing. No spinal canal stenosis. L4-L5: Grade 1 anterolisthesis. Posterior decompression. No spinal canal stenosis. Mild left foraminal stenosis. L5-S1: Disc space narrowing and endplate ridging. No spinal canal stenosis. Moderate bilateral neural foraminal stenosis, unchanged. IMPRESSION: MR THORACIC SPINE IMPRESSION 1. New T11 vertebral augmentation since the prior study. Diffuse marrow edema at T11 and T12, which may be posttraumatic or post procedural. No height loss progression at T12. 2. No thoracic spinal canal stenosis. MR LUMBAR SPINE IMPRESSION 1. Unchanged appearance of L1-L4 compression deformities with post vertebral augmentation changes. No new compression fracture. 2. Unchanged mild L2-3 spinal canal stenosis. 3. Unchanged multilevel mild-to-moderate foraminal stenosis, greatest at L5-S1. Electronically Signed   By: Ulyses Jarred M.D.   On: 11/07/2017 00:59    Procedures Procedures (including critical care time)  Medications Ordered in UC Medications  ondansetron (ZOFRAN) injection 4 mg (has no administration in time range)    Initial Impression / Assessment and Plan / UC Course  I have reviewed the triage vital signs and the nursing notes.  Pertinent labs & imaging results that were available during my care of the patient were  reviewed by me and considered in my medical decision making (see  chart for details).     Based off age, history and recent surgery, recommending further evaluation in the emergency room.  Despite negative work-up regarding back pain on Monday, patient has had new onset vomiting and likely require fluids and IV medicines/pain control that we are not able to provide here.  Provided patient with IM Zofran prior to discharge.   Final Clinical Impressions(s) / UC Diagnoses   Final diagnoses:  Nausea and vomiting, intractability of vomiting not specified, unspecified vomiting type     Discharge Instructions     Please go to emergency room for further evaluation   ED Prescriptions    None     Controlled Substance Prescriptions [place] Controlled Substance Registry consulted? Not Applicable   Janith Lima, PA-C 11/08/17 [address], [place], [place] 11/08/17 1103

## 2017-11-09 DIAGNOSIS — G894 Chronic pain syndrome: Secondary | ICD-10-CM

## 2017-11-09 DIAGNOSIS — F3341 Major depressive disorder, recurrent, in partial remission: Secondary | ICD-10-CM

## 2017-11-09 DIAGNOSIS — N183 Chronic kidney disease, stage 3 (moderate): Secondary | ICD-10-CM

## 2017-11-09 DIAGNOSIS — F411 Generalized anxiety disorder: Secondary | ICD-10-CM

## 2017-11-09 DIAGNOSIS — J4520 Mild intermittent asthma, uncomplicated: Secondary | ICD-10-CM

## 2017-11-09 DIAGNOSIS — R112 Nausea with vomiting, unspecified: Principal | ICD-10-CM

## 2017-11-09 DIAGNOSIS — I10 Essential (primary) hypertension: Secondary | ICD-10-CM

## 2017-11-09 LAB — CBC
HEMATOCRIT: 40.7 % (ref 36.0–46.0)
Hemoglobin: 13.1 g/dL (ref 12.0–15.0)
MCH: 31.3 pg (ref 26.0–34.0)
MCHC: 32.2 g/dL (ref 30.0–36.0)
MCV: 97.1 fL (ref 78.0–100.0)
Platelets: 344 10*3/uL (ref 150–400)
RBC: 4.19 MIL/uL (ref 3.87–5.11)
RDW: 13 % (ref 11.5–15.5)
WBC: 7 10*3/uL (ref 4.0–10.5)

## 2017-11-09 LAB — BASIC METABOLIC PANEL
Anion gap: 5 (ref 5–15)
BUN: 9 mg/dL (ref 8–23)
CO2: 24 mmol/L (ref 22–32)
CREATININE: 0.76 mg/dL (ref 0.44–1.00)
Calcium: 8.3 mg/dL — ABNORMAL LOW (ref 8.9–10.3)
Chloride: 109 mmol/L (ref 98–111)
GFR calc Af Amer: >60 mL/min (ref >60)
GFR calc non Af Amer: >60 mL/min (ref >60)
Glucose, Bld: 96 mg/dL (ref 70–99)
POTASSIUM: 3.7 mmol/L (ref 3.5–5.1)
SODIUM: 138 mmol/L (ref 135–145)

## 2017-11-09 LAB — GLUCOSE, CAPILLARY: Glucose-Capillary: 102 mg/dL — ABNORMAL HIGH (ref 70–99)

## 2017-11-09 MED ORDER — DICLOFENAC SODIUM 1 % TD GEL
2.0000 g | Freq: Four times a day (QID) | TRANSDERMAL | Status: DC
  Administered 2017-11-09 – 2017-11-11 (×6): 2 g via TOPICAL
  Filled 2017-11-09: qty 100

## 2017-11-09 MED ORDER — OXYCODONE HCL 5 MG PO TABS
5.0000 mg | ORAL_TABLET | Freq: Four times a day (QID) | ORAL | Status: DC | PRN
  Administered 2017-11-10 – 2017-11-11 (×3): 5 mg via ORAL
  Filled 2017-11-09 (×3): qty 1

## 2017-11-09 NOTE — Progress Notes (Signed)
PROGRESS NOTE    [name] [name]  EXB:[id number] DOB: [birth year] DOA: 11/08/2017 PCP: [name], MD      Brief Narrative:  [name] [name] is a 82 y.o. F with severe hiatal hernia and fundoplication, chronic nausea, CAD, HTN, DM, and chronic back pain who presents with vomiting and nausea.   Assessment & Plan:  Nausea and vomiting, intractable Still unable to take PO consistently.   -Reglan PRN -Schedule Zofran -As second line, can take phenergan IV -Continue IVF   Acute back pain -Continue home oxycodone -May have PRN IV hydromorphone 0.5 mg q4hrs   CAD HTN -Continue amlodipine, metoprolol -Continue statin  Diabetes -Hold metformin -SSI  Anxiety -Continue gabapentin and BZD        DVT prophylaxis: Lovenox Code Status: FULL Family Communication: Husband at bedsid MDM and disposition Plan: The below labs and imaging reports were reviewed and summarized above.    The patient was admitted with intractable nausea, probably from chronic esophageal and gastric dysmotlitiy.  Back pain flare has normalized, now primarily she is limited by PO intake.  Will continue IV fluids, hopefully advance diet in next 24 hours, possibly home tomorrow.   Consultants:   None  Procedures:   None  Antimicrobials:   None    Subjective: Still very nauseated, cannot take any PO.  No fever, bloody or bilious vomit.  No confusion.  Abdominal pain diffuse, severe, crampy.    Objective: Vitals:   11/08/17 2100 11/08/17 2315 11/09/17 0609 11/09/17 1330  BP: (!) 157/75  (!) 106/97 (!) 155/76  Pulse: 76  75 70  Resp: 18  18 (!) 22  Temp: 99.6 F (37.6 C)  99.1 F (37.3 C) 99.4 F (37.4 C)  TempSrc: Oral  Oral Oral  SpO2: 95%  97% 95%  Weight:  73.2 kg (161 lb 6 oz)    Height:  5\' 2"  (1.575 m)      Intake/Output Summary (Last 24 hours) at 11/09/2017 1543 Last data filed at 11/09/2017 1027 Gross per 24 hour  Intake 1562.46 ml  Output 650 ml  Net 912.46 ml   Filed  Weights   11/08/17 2315  Weight: 73.2 kg (161 lb 6 oz)    Examination: General appearance: Elderly adult female, alert and in no acute distress.  Appears tired. HEENT: Anicteric, conjunctiva pink, lids and lashes normal. No nasal deformity, discharge, epistaxis.  Lips moist, oropharynx dry, no oral lesions, hearing normal.   Skin: Warm and dry.  no jaundice.  No suspicious rashes or lesions. Cardiac: RRR, nl S1-S2, no murmurs appreciated.  Capillary refill is brisk.  JVP normal.  No LE edema.  Radia  pulses 2+ and symmetric. Respiratory: Normal respiratory rate and rhythm.  CTAB without rales or wheezes. Abdomen: Abdomen soft.  Moderate diffuse nonfocal TTP without guarding. No ascites, distension, hepatosplenomegaly.   MSK: No deformities or effusions. Neuro: Awake and alert.  EOMI, moves all extremities. Speech fluent.    Psych: Sensorium intact and responding to questions, attention normal. Affect normal.  Judgment and insight appear normal.    Data Reviewed: I have personally reviewed following labs and imaging studies:  CBC: Recent Labs  Lab 11/03/17 1039 11/08/17 1148 11/09/17 0354  WBC 8.1 8.2 7.0  NEUTROABS  --  6.6  --   HGB 14.4 15.0 13.1  HCT 44.8 47.0* 40.7  MCV 98.0 98.3 97.1  PLT 316 332 253   Basic Metabolic Panel: Recent Labs  Lab 11/06/17 2210 11/08/17 1148 11/09/17 0354  NA  139 138 138  K 3.8 4.1 3.7  CL 105 102 109  CO2 23 23 24   GLUCOSE 97 138* 96  BUN 12 11 9   CREATININE 0.87 0.92 0.76  CALCIUM 9.6 9.8 8.3*   GFR: Estimated Creatinine Clearance: 52.5 mL/min (by C-G formula based on SCr of 0.76 mg/dL). Liver Function Tests: Recent Labs  Lab 11/08/17 1148  AST 33  ALT 30  ALKPHOS 100  BILITOT 0.9  PROT 7.7  ALBUMIN 4.3   Recent Labs  Lab 11/08/17 1148  LIPASE 17   No results for input(s): AMMONIA in the last 168 hours. Coagulation Profile: Recent Labs  Lab 11/03/17 1039  INR 0.92   Cardiac Enzymes: No results for input(s):  CKTOTAL, CKMB, CKMBINDEX, TROPONINI in the last 168 hours. BNP (last 3 results) No results for input(s): PROBNP in the last 8760 hours. HbA1C: No results for input(s): HGBA1C in the last 72 hours. CBG: Recent Labs  Lab 11/09/17 0815  GLUCAP 102*   Lipid Profile: No results for input(s): CHOL, HDL, LDLCALC, TRIG, CHOLHDL, LDLDIRECT in the last 72 hours. Thyroid Function Tests: No results for input(s): TSH, T4TOTAL, FREET4, T3FREE, THYROIDAB in the last 72 hours. Anemia Panel: No results for input(s): VITAMINB12, FOLATE, FERRITIN, TIBC, IRON, RETICCTPCT in the last 72 hours. Urine analysis:    Component Value Date/Time   COLORURINE YELLOW 11/08/2017 1625   APPEARANCEUR CLEAR 11/08/2017 1625   LABSPEC 1.010 11/08/2017 1625   PHURINE 7.0 11/08/2017 1625   GLUCOSEU NEGATIVE 11/08/2017 1625   HGBUR NEGATIVE 11/08/2017 1625   BILIRUBINUR NEGATIVE 11/08/2017 1625   KETONESUR NEGATIVE 11/08/2017 1625   PROTEINUR NEGATIVE 11/08/2017 1625   UROBILINOGEN 0.2 09/19/2014 2235   NITRITE NEGATIVE 11/08/2017 1625   LEUKOCYTESUR NEGATIVE 11/08/2017 1625   Sepsis Labs: @LABRCNTIP (procalcitonin:4,lacticacidven:4)  )No results found for this or any previous visit (from the past 240 hour(s)).       Radiology Studies: Ct Renal Stone Study  Result Date: 11/08/2017 CLINICAL DATA:  RIGHT-sided flank pain with nausea and vomiting since yesterday suspected stone disease, history of colonic diverticulosis, stage III chronic kidney disease, asthma, coronary artery disease, hypertension, hiatal hernia, GERD EXAM: CT ABDOMEN AND PELVIS WITHOUT CONTRAST TECHNIQUE: Multidetector CT imaging of the abdomen and pelvis was performed following the standard protocol without IV contrast. COMPARISON:  04/05/2017 FINDINGS: Lower chest: Bibasilar atelectasis slightly greater on RIGHT. Hepatobiliary: Gallbladder surgically absent.  Liver unremarkable. Pancreas: Atrophic pancreas without mass Spleen: Normal appearance  Adrenals/Urinary Tract: Adrenal glands normal appearance. Atrophic kidneys without mass or hydronephrosis. Normal appearing ureters and bladder. Small curvilinear metallic foreign body just below bladder base, of uncertain etiology. Stomach/Bowel: Appendix surgically absent by history. Diverticulosis of descending and sigmoid colon without evidence of diverticulitis. Stomach demonstrates postsurgical changes from fundoplication with small persistent hiatal hernia. Bowel loops otherwise normal appearance. Vascular/Lymphatic: Atherosclerotic calcifications aorta without aneurysm. No adenopathy. Reproductive: Uterus surgically absent.  Atrophic ovaries. Other: No free air or free fluid. No acute inflammatory process. Tiny umbilical hernia containing fat. Musculoskeletal: Multiple prior thoracolumbar spine spinal augmentation procedures with postsurgical changes of posterior fusion L1-L3. Marked osseous demineralization. IMPRESSION: Distal colonic diverticulosis without evidence of diverticulitis. Small hiatal hernia with evidence of prior fundoplication. No acute intra-abdominal or intrapelvic abnormalities. Osseous demineralization with multiple thoracolumbar compression fractures and evidence of prior spinal augmentation procedures as well as L1-L3 spinal fusion. Electronically Signed   By: Lavonia Dana M.D.   On: 11/08/2017 16:57        Scheduled Meds: . diclofenac  sodium  2 g Topical QID  . enoxaparin (LOVENOX) injection  40 mg Subcutaneous Q24H  . gabapentin  300 mg Oral TID  . lidocaine  1 patch Transdermal Q24H  . ondansetron  4 mg Intravenous Q8H   Continuous Infusions: . sodium chloride 125 mL/hr at 11/09/17 1429     LOS: 1 day    Time spent: 25 minutes    Edwin Dada, MD [facility] 11/09/2017, 3:43 PM     Pager [phone] --- please page though AMION:  [email or web address] Password [email or web address] If 7PM-7AM, please contact night-coverage

## 2017-11-10 LAB — BASIC METABOLIC PANEL
Anion gap: 5 (ref 5–15)
BUN: 8 mg/dL (ref 8–23)
CHLORIDE: 111 mmol/L (ref 98–111)
CO2: 24 mmol/L (ref 22–32)
CREATININE: 0.77 mg/dL (ref 0.44–1.00)
Calcium: 8.3 mg/dL — ABNORMAL LOW (ref 8.9–10.3)
GFR calc Af Amer: >60 mL/min (ref >60)
GFR calc non Af Amer: >60 mL/min (ref >60)
Glucose, Bld: 94 mg/dL (ref 70–99)
Potassium: 3.5 mmol/L (ref 3.5–5.1)
SODIUM: 140 mmol/L (ref 135–145)

## 2017-11-10 LAB — GLUCOSE, CAPILLARY
GLUCOSE-CAPILLARY: 94 mg/dL (ref 70–99)
Glucose-Capillary: 134 mg/dL — ABNORMAL HIGH (ref 70–99)

## 2017-11-10 MED ORDER — ONDANSETRON HCL 4 MG PO TABS: 8.0000 mg | ORAL_TABLET | Freq: Four times a day (QID) | ORAL | Status: DC | PRN

## 2017-11-10 MED ORDER — PROMETHAZINE HCL 25 MG PO TABS: 25.0000 mg | ORAL_TABLET | Freq: Four times a day (QID) | ORAL | Status: DC | PRN

## 2017-11-10 MED ORDER — METOCLOPRAMIDE HCL 10 MG PO TABS: 10.0000 mg | ORAL_TABLET | Freq: Four times a day (QID) | ORAL | Status: DC | PRN

## 2017-11-10 MED ORDER — METOCLOPRAMIDE HCL 5 MG/ML IJ SOLN: 5.0000 mg | Freq: Four times a day (QID) | INTRAMUSCULAR | Status: DC | PRN

## 2017-11-10 MED ORDER — PROMETHAZINE HCL 25 MG/ML IJ SOLN
12.5000 mg | Freq: Four times a day (QID) | INTRAMUSCULAR | Status: DC | PRN
  Administered 2017-11-11: 12.5 mg via INTRAVENOUS
  Filled 2017-11-10: qty 1

## 2017-11-10 MED ORDER — ONDANSETRON HCL 4 MG/2ML IJ SOLN
4.0000 mg | Freq: Four times a day (QID) | INTRAMUSCULAR | Status: DC | PRN
  Administered 2017-11-10: 4 mg via INTRAVENOUS
  Filled 2017-11-10: qty 2

## 2017-11-10 MED ORDER — PROMETHAZINE HCL 25 MG RE SUPP
25.0000 mg | Freq: Four times a day (QID) | RECTAL | Status: DC | PRN
  Filled 2017-11-10: qty 1

## 2017-11-10 NOTE — Progress Notes (Signed)
PROGRESS NOTE    PHEONIX WISBY  RUE:[id number] DOB: August 05, 1935 DOA: 11/08/2017 PCP: [name], MD      Brief Narrative:  Mrs. Dolinski is a 82 y.o. F with severe hiatal hernia and fundoplication, chronic nausea, CAD, HTN, DM, and chronic back pain who presents with vomiting and nausea.   Assessment & Plan:  Nausea and vomiting, intractable This appears to be from gastroparesis (this is now a second or third episode of intractable vomiting that has put her int he hospital), or it is as simple as a viral GE or medication side effect in the context of her esophageal dysmotility and hiatal hernia.  Today, only able to tolerate clears.   -Continue Reglan, Zofran, phenergan PRN -Advance to full liquid then soft diet tonight -Stop IVF -If able to take soft diet tomorrow, home with GI follow up -Otherwise, will consult [facility]    Acute back pain This seems to be located in the right flank.  Not from her kyphoplasty.  No RP hematoma on CT.  Mild, worse with movement, likely muscle strain. -Trial Kpad -PT eval -Continue home oxycodone  CAD HTN -Continue amlodipine, metoprolol, statin  Diabetes  -Hold metformin -Continue SSI  Anxiety -Continue gabapentin and benzodiazepine         DVT prophylaxis: Lovenox Code Status: FULL Family Communication: Husband at bedsid MDM and disposition Plan: The below labs and imaging reports were reviewed and summarized above.  The patient was admitted with intractable nausea, cause as above.  Back pain failure has improved, she is only able to tolerate a little bit of p.o. intake, will stop IV fluids, and hopefully advance diet to soft by diet by tomorrow and home in the morning.   Consultants:   None  Procedures:   None  Antimicrobials:   None    Subjective: Nausea persists, vomiting somewhat during the day, but is been able to take clear liquids this afternoon, abdominal pain is mild, in the ribs, worse after vomiting or  dry heaves.  She has some right-sided flank pain, no lower back, without radiation to the leg, or central back pain.  No fever, cough, confusion.  Nursing note some loose diarrhea.  Objective: Vitals:   11/09/17 1330 11/09/17 2210 11/10/17 0441 11/10/17 1432  BP: (!) 155/76 (!) 152/84 133/78 134/71  Pulse: 70 68 63 80  Resp: (!) 22 18 16 20   Temp: 99.4 F (37.4 C) 98.1 F (36.7 C) 98 F (36.7 C) 98.3 F (36.8 C)  TempSrc: Oral Oral Oral Oral  SpO2: 95% 93% 97% 96%  Weight:   72.9 kg (160 lb 11.5 oz)   Height:        Intake/Output Summary (Last 24 hours) at 11/10/2017 1653 Last data filed at 11/10/2017 1300 Gross per 24 hour  Intake 3255.37 ml  Output 950 ml  Net 2305.37 ml   Filed Weights   11/08/17 2315 11/10/17 0441  Weight: 73.2 kg (161 lb 6 oz) 72.9 kg (160 lb 11.5 oz)    Examination: General appearance: [place], lying in bed, appears tired, interactive. HEENT: Anicteric, conjunctival pink, lids and lashes normal, no nasal deformity, discharge, or epistaxis.  Oropharynx dry, no oral lesions, hearing normal. Skin: Skin warm and dry without jaundice, suspicious rashes or lesions. Cardiac: Heart rate regular without murmurs, no lower extremity edema. Respiratory: Respiratory effort normal, lungs clear without rales or wheezes. Abdomen: Abdomen soft, Tenderness to palpation along the ribs on either side, no abdominal tenderness.  No guarding, no  rebound.    MSK: No vertebral tenderness.  Mild tenderness in the right upper iliac crest. Neuro: Awake and alert, extraocular movements intact, cranial nerves normal, moves all extremities, speech fluent. Psych: Interim intact responding to questions, intention normal.  Affect normal.  Judgment and insight appear normal.    Data Reviewed: I have personally reviewed following labs and imaging studies:  CBC: Recent Labs  Lab 11/08/17 1148 11/09/17 0354  WBC 8.2 7.0  NEUTROABS 6.6  --   HGB 15.0 13.1  HCT 47.0* 40.7    MCV 98.3 97.1  PLT 332 099   Basic Metabolic Panel: Recent Labs  Lab 11/06/17 2210 11/08/17 1148 11/09/17 0354 11/10/17 0447  NA 139 138 138 140  K 3.8 4.1 3.7 3.5  CL 105 102 109 111  CO2 23 23 24 24   GLUCOSE 97 138* 96 94  BUN 12 11 9 8   CREATININE 0.87 0.92 0.76 0.77  CALCIUM 9.6 9.8 8.3* 8.3*   GFR: Estimated Creatinine Clearance: 52.4 mL/min (by C-G formula based on SCr of 0.77 mg/dL). Liver Function Tests: Recent Labs  Lab 11/08/17 1148  AST 33  ALT 30  ALKPHOS 100  BILITOT 0.9  PROT 7.7  ALBUMIN 4.3   Recent Labs  Lab 11/08/17 1148  LIPASE 17   No results for input(s): AMMONIA in the last 168 hours. Coagulation Profile: No results for input(s): INR, PROTIME in the last 168 hours. Cardiac Enzymes: No results for input(s): CKTOTAL, CKMB, CKMBINDEX, TROPONINI in the last 168 hours. BNP (last 3 results) No results for input(s): PROBNP in the last 8760 hours. HbA1C: No results for input(s): HGBA1C in the last 72 hours. CBG: Recent Labs  Lab 11/09/17 0815 11/10/17 0814  GLUCAP 102* 94   Lipid Profile: No results for input(s): CHOL, HDL, LDLCALC, TRIG, CHOLHDL, LDLDIRECT in the last 72 hours. Thyroid Function Tests: No results for input(s): TSH, T4TOTAL, FREET4, T3FREE, THYROIDAB in the last 72 hours. Anemia Panel: No results for input(s): VITAMINB12, FOLATE, FERRITIN, TIBC, IRON, RETICCTPCT in the last 72 hours. Urine analysis:    Component Value Date/Time   COLORURINE YELLOW 11/08/2017 1625   APPEARANCEUR CLEAR 11/08/2017 1625   LABSPEC 1.010 11/08/2017 1625   PHURINE 7.0 11/08/2017 1625   GLUCOSEU NEGATIVE 11/08/2017 1625   HGBUR NEGATIVE 11/08/2017 1625   BILIRUBINUR NEGATIVE 11/08/2017 1625   KETONESUR NEGATIVE 11/08/2017 1625   PROTEINUR NEGATIVE 11/08/2017 1625   UROBILINOGEN 0.2 09/19/2014 2235   NITRITE NEGATIVE 11/08/2017 1625   LEUKOCYTESUR NEGATIVE 11/08/2017 1625   Sepsis  Labs: @LABRCNTIP (procalcitonin:4,lacticacidven:4)  )No results found for this or any previous visit (from the past 240 hour(s)).       Radiology Studies: No results found.      Scheduled Meds: . diclofenac sodium  2 g Topical QID  . enoxaparin (LOVENOX) injection  40 mg Subcutaneous Q24H  . gabapentin  300 mg Oral TID  . lidocaine  1 patch Transdermal Q24H   Continuous Infusions:    LOS: 2 days    Time spent: 25 minutes   Edwin Dada, MD [facility] 11/10/2017, 4:53 PM     Pager [phone] --- please page though AMION:  [email or web address] Password [email or web address] If 7PM-7AM, please contact night-coverage

## 2017-11-10 NOTE — Care Management Important Message (Signed)
Important Message  Patient Details  Name: [name] [name] MRN: [id number] Date of Birth: Feb 28, 1936   Medicare Important Message Given:  Yes    Graesyn Schreifels [address] 11/10/2017, 4:37 PM

## 2017-11-11 DIAGNOSIS — K222 Esophageal obstruction: Secondary | ICD-10-CM

## 2017-11-11 DIAGNOSIS — R1310 Dysphagia, unspecified: Secondary | ICD-10-CM

## 2017-11-11 DIAGNOSIS — K219 Gastro-esophageal reflux disease without esophagitis: Secondary | ICD-10-CM

## 2017-11-11 DIAGNOSIS — R7303 Prediabetes: Secondary | ICD-10-CM

## 2017-11-11 LAB — CBC
HEMATOCRIT: 41.8 % (ref 36.0–46.0)
HEMOGLOBIN: 13.9 g/dL (ref 12.0–15.0)
MCH: 32.1 pg (ref 26.0–34.0)
MCHC: 33.3 g/dL (ref 30.0–36.0)
MCV: 96.5 fL (ref 78.0–100.0)
Platelets: 318 10*3/uL (ref 150–400)
RBC: 4.33 MIL/uL (ref 3.87–5.11)
RDW: 12.8 % (ref 11.5–15.5)
WBC: 5.9 10*3/uL (ref 4.0–10.5)

## 2017-11-11 LAB — BASIC METABOLIC PANEL
ANION GAP: 9 (ref 5–15)
BUN: 6 mg/dL — ABNORMAL LOW (ref 8–23)
CHLORIDE: 108 mmol/L (ref 98–111)
CO2: 25 mmol/L (ref 22–32)
CREATININE: 0.82 mg/dL (ref 0.44–1.00)
Calcium: 9 mg/dL (ref 8.9–10.3)
GFR calc Af Amer: >60 mL/min (ref >60)
GFR calc non Af Amer: >60 mL/min (ref >60)
Glucose, Bld: 103 mg/dL — ABNORMAL HIGH (ref 70–99)
Potassium: 3 mmol/L — ABNORMAL LOW (ref 3.5–5.1)
Sodium: 142 mmol/L (ref 135–145)

## 2017-11-11 LAB — GLUCOSE, CAPILLARY: Glucose-Capillary: 105 mg/dL — ABNORMAL HIGH (ref 70–99)

## 2017-11-11 MED ORDER — POTASSIUM CHLORIDE 20 MEQ PO PACK
40.0000 meq | PACK | Freq: Once | ORAL | Status: AC
  Administered 2017-11-11: 40 meq via ORAL
  Filled 2017-11-11: qty 2

## 2017-11-11 MED ORDER — HYDRALAZINE HCL 20 MG/ML IJ SOLN
10.0000 mg | Freq: Four times a day (QID) | INTRAMUSCULAR | Status: DC | PRN
  Administered 2017-11-11: 10 mg via INTRAVENOUS
  Filled 2017-11-11: qty 1

## 2017-11-11 MED ORDER — PROMETHAZINE HCL 25 MG RE SUPP: 25.0000 mg | Freq: Four times a day (QID) | RECTAL | 0 refills | Status: DC | PRN

## 2017-11-11 MED ORDER — PROMETHAZINE HCL 25 MG PO TABS: 25.0000 mg | ORAL_TABLET | Freq: Four times a day (QID) | ORAL | 1 refills | Status: DC | PRN

## 2017-11-11 MED ORDER — OXYCODONE HCL 5 MG PO TABS
5.0000 mg | ORAL_TABLET | Freq: Once | ORAL | Status: AC
  Administered 2017-11-11: 5 mg via ORAL
  Filled 2017-11-11: qty 1

## 2017-11-11 NOTE — Progress Notes (Signed)
PT Cancellation Note/Discharge Note  Patient Details Name: [name] [name] MRN: [id number] DOB: November 06, 1935   Cancelled Treatment:    Reason Eval/Treat Not Completed: Other (comment)(PT declined) Nice discussion with patient. She is hopefull for DC today and states she has been getting around in the room without difficulty (with nursing supervision). We discussed that outpatient PT might be more appropriate to attempt to help her with her ongoing back pain and she was in agreement.  I advised her to discuss with her primary MD that works with her back. No acute PT needs therefore will sign off.   Melvern Banker 11/11/2017, 9:14 AM Lavonia Dana, PT  [phone] 11/11/2017

## 2017-11-11 NOTE — Progress Notes (Signed)
Patient was discharged home by MD order; discharged instructions  review and give to patient with care notes and prescription; patient received pain medicine and nausea medicine before discharge; IV DIC; patient will be escorted to the car by nurse tech via wheelchair.

## 2017-11-11 NOTE — Discharge Summary (Signed)
Physician Discharge Summary  [name] [name] LFY:[id number] DOB: [birth year] DOA: 11/08/2017  PCP: [name], MD  Admit date: 11/08/2017 Discharge date: 11/11/2017  Admitted From: Home  Disposition:  Home   Recommendations for Outpatient Follow-up:  1. Follow up with PCP in 1-2 weeks 2. Please follow up with Gastroenterology when able; in particular, follow up UGI series from 6/26 3. Repeat BMP in 1 week to evaluate potassium      Home Health: None  Equipment/Devices: None  Discharge Condition: Good  CODE STATUS: FULL Diet recommendation: Soft  Brief/Interim Summary: [name] [name] is a 82 y.o. F with severe hiatal hernia and fundoplication, chronic nausea, CAD, HTN, DM, and chronic back pain who had a kyphoplasty several days before admission, was given "two pills" (she is not sure what, maybe Robaxin? Baclofen?) after which she developed intractable vomiting and nausea as well as back pain in the right flank.          Discharge Diagnoses:    Acute on chronic nausea and vomiting, intractable This has the clinical pattern of gastroparesis (I.e. this is now a second or third episode of intractable vomiting that has put her in the hospital), although she follows closely with GI and does not carry this diagnosis.    Alternatively, it may be as simple as a viral GE or medication side effect in the context of her esophageal dysmotility and hiatal hernia (she certainly thought it started after the meds given her after her kyphoplasty).    She was treated with IVF, IV anti-emetics.  Best response was to Phenergan.  Advanced diet in last 24 hours, taking PO well today, symptoms mostly resolved.  Discharged home with phenergan Rx and GI follow up.  Of note, recent UGI series showed <1cm stenosis of esophagus?  Follow up for this is pending, she was not aware of results.    Acute back pain This seems to be located in the right flank.  Not from her kyphoplasty.  No RP hematoma on  CT.  Mild, worse with movement, likely muscle strain.   CAD HTN No change to home BP regimen.  Diabetes  Glucoses well controlled.  Anxiety Counseled to taper off Xanax with her prescribers cooperation, given risks of comedication with opiates/BZD.  Hypokalemia From vomiting.  Repleted.  Needs outpatient follow up.         Discharge Instructions  Discharge Instructions    Diet - low sodium heart healthy   Complete by:  As directed    Discharge instructions   Complete by:  As directed    From Dr. Loleta [name]: You were admitted for back pain and uncontrollable vomiting.  Nothing showed up on your scans to explain the back pain, which is VERY reassuring.  Also, the location of the pain, out by your side and hip suggests it was muscle related, not from your spine where the kyphoplasty was done.  Do gentle stretches, stay active, use a heating pad if it helps and take your home pain medicines and muscle relaxers.  For the nausea, I suspect either the pills you took made you nauseated, or you happened to catch a stomach bug, that just had to run its course.  Given your hiatal hernia and esophagus wrap and the slow esophagus and narrowing, I am sure you are just more vulnerable to vomiting like this. I will send your discharge summary to Dr. Havery [name], please call his office to follow up.  I also sent a refill for Phenergan to the  [facility] off [address].  In addition, you should have a printed copy of the phenergan suppositories, that you can keep if some day in the future you can't keep pills down but still need nausea medicine.  Resume all your other home medicines.  Your potassium was somewhat low, for the next few days, eat plenty of potassium rich foods (thankfully, these are soft, like mashed potatoes or bananas or orange juice).   Have your primary care doctor repeat a potassium in a week.   Increase activity slowly   Complete by:  As directed      Allergies as of 11/11/2017       Reactions   Ace Inhibitors Cough   Per Dr Melvyn Novas pulmonology 2013   Aspartame Diarrhea, Nausea And Vomiting   Atorvastatin Other (See Comments)   Muscle aches   Biaxin [clarithromycin] Other (See Comments)   PATIENT CAN TOLERATE Z-PAK.  Is written on patient's paper chart.   Daliresp [roflumilast] Nausea And Vomiting   Erythromycin Other (See Comments)   PATIENT CAN TOLERATE Z-PAK.  It is written on patient's paper chart.   Levofloxacin Nausea And Vomiting      Medication List    TAKE these medications   acetaminophen 500 MG tablet Commonly known as:  TYLENOL Take 1,000 mg by mouth every 6 (six) hours as needed for mild pain.   albuterol 108 (90 Base) MCG/ACT inhaler Commonly known as:  PROAIR HFA Inhale 2 puffs into the lungs every 6 (six) hours as needed for wheezing or shortness of breath.   ALPRAZolam 0.5 MG tablet Commonly known as:  XANAX Take 1/2 to 1 tablet 2 to 3 x / day ONLY if Anxiety Attack and please try to limit to 5 days /week to avoid addiction What changed:    how much to take  how to take this  when to take this  reasons to take this  additional instructions   amLODipine 10 MG tablet Commonly known as:  NORVASC TAKE 1 TABLET BY MOUTH AT BEDTIME   diclofenac sodium 1 % Gel Commonly known as:  VOLTAREN APPLY 4 G TOPICALLY FOUR TIMES DAILY What changed:  See the new instructions.   dicyclomine 10 MG capsule Commonly known as:  BENTYL TAKE 1 CAPSULE BY MOUTH EVERY 8 HOURS AS NEEDED FOR SPASM   gabapentin 300 MG capsule Commonly known as:  NEURONTIN TAKE 1 CAPSULE BY MOUTH THREE TIMES DAILY FOR  CHRONIC  PAIN.   ipratropium-albuterol 0.5-2.5 (3) MG/3ML Soln Commonly known as:  DUONEB USE ONE VIAL IN NEBULIZER 4 TIMES DAILY What changed:    how much to take  how to take this  when to take this  reasons to take this  additional instructions   ketoconazole 2 % cream Commonly known as:  NIZORAL APPLY 1 APPLICATION TOPICALLY TWO  TIMES DAILY What changed:  See the new instructions.   losartan 100 MG tablet Commonly known as:  COZAAR TAKE ONE TABLET BY MOUTH ONCE DAILY (DISCONTINUE  BENICAR)   meclizine 25 MG tablet Commonly known as:  ANTIVERT 1/2-1 pill up to 3 times daily for motion sickness/dizziness What changed:    how much to take  how to take this  when to take this  reasons to take this  additional instructions   methocarbamol 500 MG tablet Commonly known as:  ROBAXIN Take 1 tablet (500 mg total) by mouth every 6 (six) hours as needed for muscle spasms.   metoprolol tartrate 50 MG tablet Commonly known  as:  LOPRESSOR Take 50 mg by mouth at bedtime.   mometasone-formoterol 100-5 MCG/ACT Aero Commonly known as:  DULERA Inhale 2 puffs into the lungs every 12 (twelve) hours. What changed:    when to take this  reasons to take this   neomycin-polymyxin b-dexamethasone 3.5-10000-0.1 Oint Commonly known as:  MAXITROL APPLY TO STYE FOUR TIMES DAILY. What changed:    how much to take  how to take this  when to take this  additional instructions   nitroGLYCERIN 0.4 MG SL tablet Commonly known as:  NITROSTAT Place 1 tablet (0.4 mg total) under the tongue every 5 (five) minutes as needed for chest pain.   ondansetron 8 MG disintegrating tablet Commonly known as:  ZOFRAN-ODT DISSOLVE ONE TABLET IN MOUTH EVERY 8 HOURS AS NEEDED FOR NAUSEA AND  VOMITING   OVER THE COUNTER MEDICATION Take 1,000 mg by mouth 2 (two) times daily. Coral Calcium 1000mg    oxyCODONE-acetaminophen 5-325 MG tablet Commonly known as:  PERCOCET/ROXICET Take 1 tablet by mouth every 4 (four) hours as needed for severe pain.   polyethylene glycol powder powder Commonly known as:  GLYCOLAX/MIRALAX Take 17 g by mouth 2 (two) times daily. What changed:    when to take this  reasons to take this   pravastatin 40 MG tablet Commonly known as:  PRAVACHOL Take 1 tablet (40 mg total) by mouth daily.    promethazine 25 MG tablet Commonly known as:  PHENERGAN Take 1 tablet (25 mg total) by mouth every 6 (six) hours as needed for nausea or vomiting. What changed:  Another medication with the same name was added. Make sure you understand how and when to take each.   promethazine 25 MG suppository Commonly known as:  PHENERGAN Place 1 suppository (25 mg total) rectally every 6 (six) hours as needed for vomiting. What changed:  You were already taking a medication with the same name, and this prescription was added. Make sure you understand how and when to take each.   senna-docusate 8.6-50 MG tablet Commonly known as:  Senokot-S Take 2 tablets by mouth 2 (two) times daily.   silodosin 8 MG Caps capsule Commonly known as:  RAPAFLO Take 8 mg by mouth daily with breakfast.   Vitamin D3 5000 units Caps Take 5,000 Units by mouth 2 (two) times daily.       Allergies  Allergen Reactions  . Ace Inhibitors Cough    Per Dr Melvyn Novas pulmonology 2013  . Aspartame Diarrhea and Nausea And Vomiting  . Atorvastatin Other (See Comments)    Muscle aches  . Biaxin [Clarithromycin] Other (See Comments)    PATIENT CAN TOLERATE Z-PAK.  Is written on patient's paper chart.  [name] [Roflumilast] Nausea And Vomiting  . Erythromycin Other (See Comments)    PATIENT CAN TOLERATE Z-PAK.  It is written on patient's paper chart.  . Levofloxacin Nausea And Vomiting    Consultations:  None   Procedures/Studies: Dg Thoracic Spine 2 View  Result Date: 11/06/2017 CLINICAL DATA:  Worsening right-sided back pain EXAM: THORACIC SPINE 2 VIEWS COMPARISON:  Chest x-ray 09/26/2017, MRI 12/13/2016, radiograph 12/06/2016 MRI 10/10/2017 FINDINGS: Minimal kyphosis at the thoracolumbar junction. Post augmentation changes at T11 and T12, findings at T11 are new since radiograph 10/26/2017. Partially visualized lumbar spine hardware. Remaining vertebral body heights demonstrate normal stature. IMPRESSION: Post  augmentation changes at T11 and T12. Remaining thoracic vertebral bodies show no acute interval change compared to prior radiograph Electronically Signed   By: Maudie Mercury  Francoise Ceo M.D.   On: 11/06/2017 19:04   Dg Lumbar Spine Complete  Result Date: 11/06/2017 CLINICAL DATA:  Back pain EXAM: LUMBAR SPINE - COMPLETE 4+ VIEW COMPARISON:  10/26/2017, 09/26/2017 and 10/10/2017 MRI FINDINGS: Surgical clips in the upper abdomen. Posterior stabilization rods and fixating screws L1 through L3. Multiple compression fractures L1 through L4 with post augmentation changes at L2-L3 and L4. Similar appearance of post augmentation changes at T12. New post augmentation changes at T11. Marked degenerative change at L5-S1. Small amount of radiopaque material in the distal colon. IMPRESSION: 1. Interval post augmentation changes at T11 since 10/26/2017 radiograph. Multiple chronic compression 6 L1 through L4 with postsurgical changes L1 through L3. 2. No definite acute interval change radiographically since most recent prior radiograph. Electronically Signed   By: Donavan Foil M.D.   On: 11/06/2017 19:08   Mr Thoracic Spine Wo Contrast  Result Date: 11/07/2017 CLINICAL DATA:  Right-sided thoracic back pain. Recent T11 vertebral augmentation. EXAM: MRI THORACIC AND LUMBAR SPINE WITHOUT CONTRAST TECHNIQUE: Multiplanar and multiecho pulse sequences of the thoracic and lumbar spine were obtained without intravenous contrast. COMPARISON:  Lumbar spine MRI 10/10/2017 FINDINGS: MRI THORACIC SPINE FINDINGS Alignment:  Physiologic. Vertebrae: There is compression deformity with less than 25% height loss at T11 and approximately 25% height loss at T12. Findings of recent vertebral augmentation. The appearance of T12 is unchanged compared to the 10/10/2017 MRI. The T11 augmentation is new compared to prior studies. There is diffuse marrow edema at both levels. Cord:  Normal signal and morphology. Paraspinal and other soft tissues: Mild edema in  the posterior paraspinal soft tissues at T11 consistent with recent vertebral augmentation. Disc levels: No disc herniation or stenosis. MRI LUMBAR SPINE FINDINGS Segmentation:  Standard. Alignment:  Grade 1 anterolisthesis at L4-L5. Vertebrae: L1-L3 PLIF. Chronic compression deformities at the L1-L4 levels are unchanged. There are findings compatible with multilevel vertebral augmentation, also unchanged. No new compression fracture. No discitis. Conus medullaris and cauda equina: Conus extends to the L1 level. Conus and cauda equina appear normal. Paraspinal and other soft tissues: No retroperitoneal abnormality. Disc levels: L1-L2: Postfusion changes.  No stenosis. L2-L3: Postfusion changes. Small disc osteophyte complex and facet hypertrophy with mild narrowing of the spinal canal. Mild bilateral foraminal stenosis. L3-L4: Moderate facet hypertrophy with mild bilateral foraminal narrowing. No spinal canal stenosis. L4-L5: Grade 1 anterolisthesis. Posterior decompression. No spinal canal stenosis. Mild left foraminal stenosis. L5-S1: Disc space narrowing and endplate ridging. No spinal canal stenosis. Moderate bilateral neural foraminal stenosis, unchanged. IMPRESSION: MR THORACIC SPINE IMPRESSION 1. New T11 vertebral augmentation since the prior study. Diffuse marrow edema at T11 and T12, which may be posttraumatic or post procedural. No height loss progression at T12. 2. No thoracic spinal canal stenosis. MR LUMBAR SPINE IMPRESSION 1. Unchanged appearance of L1-L4 compression deformities with post vertebral augmentation changes. No new compression fracture. 2. Unchanged mild L2-3 spinal canal stenosis. 3. Unchanged multilevel mild-to-moderate foraminal stenosis, greatest at L5-S1. Electronically Signed   By: Ulyses Jarred M.D.   On: 11/07/2017 00:59   Mr Lumbar Spine Wo Contrast  Result Date: 11/07/2017 CLINICAL DATA:  Right-sided thoracic back pain. Recent T11 vertebral augmentation. EXAM: MRI THORACIC AND  LUMBAR SPINE WITHOUT CONTRAST TECHNIQUE: Multiplanar and multiecho pulse sequences of the thoracic and lumbar spine were obtained without intravenous contrast. COMPARISON:  Lumbar spine MRI 10/10/2017 FINDINGS: MRI THORACIC SPINE FINDINGS Alignment:  Physiologic. Vertebrae: There is compression deformity with less than 25% height loss at T11 and approximately 25%  height loss at T12. Findings of recent vertebral augmentation. The appearance of T12 is unchanged compared to the 10/10/2017 MRI. The T11 augmentation is new compared to prior studies. There is diffuse marrow edema at both levels. Cord:  Normal signal and morphology. Paraspinal and other soft tissues: Mild edema in the posterior paraspinal soft tissues at T11 consistent with recent vertebral augmentation. Disc levels: No disc herniation or stenosis. MRI LUMBAR SPINE FINDINGS Segmentation:  Standard. Alignment:  Grade 1 anterolisthesis at L4-L5. Vertebrae: L1-L3 PLIF. Chronic compression deformities at the L1-L4 levels are unchanged. There are findings compatible with multilevel vertebral augmentation, also unchanged. No new compression fracture. No discitis. Conus medullaris and cauda equina: Conus extends to the L1 level. Conus and cauda equina appear normal. Paraspinal and other soft tissues: No retroperitoneal abnormality. Disc levels: L1-L2: Postfusion changes.  No stenosis. L2-L3: Postfusion changes. Small disc osteophyte complex and facet hypertrophy with mild narrowing of the spinal canal. Mild bilateral foraminal stenosis. L3-L4: Moderate facet hypertrophy with mild bilateral foraminal narrowing. No spinal canal stenosis. L4-L5: Grade 1 anterolisthesis. Posterior decompression. No spinal canal stenosis. Mild left foraminal stenosis. L5-S1: Disc space narrowing and endplate ridging. No spinal canal stenosis. Moderate bilateral neural foraminal stenosis, unchanged. IMPRESSION: MR THORACIC SPINE IMPRESSION 1. New T11 vertebral augmentation since the  prior study. Diffuse marrow edema at T11 and T12, which may be posttraumatic or post procedural. No height loss progression at T12. 2. No thoracic spinal canal stenosis. MR LUMBAR SPINE IMPRESSION 1. Unchanged appearance of L1-L4 compression deformities with post vertebral augmentation changes. No new compression fracture. 2. Unchanged mild L2-3 spinal canal stenosis. 3. Unchanged multilevel mild-to-moderate foraminal stenosis, greatest at L5-S1. Electronically Signed   By: Ulyses Jarred M.D.   On: 11/07/2017 00:59   Dg Ugi W/high Density W/kub  Result Date: 11/01/2017 CLINICAL DATA:  Nausea and dysphagia with solids sticking. Hiatal hernia repair and Nissen fundoplication in 3419. EXAM: UPPER GI SERIES WITH KUB TECHNIQUE: After obtaining a scout radiograph a routine upper GI series was performed using thin and high density barium. FLUOROSCOPY TIME:  Fluoroscopy Time:  4 minutes, 42 seconds Radiation Exposure Index (if provided by the fluoroscopic device): 66.2 mGy Number of Acquired Spot Images: 3 COMPARISON:  Multiple exams, including CT scan from 04/05/2017 FINDINGS: The patient has severe back pain related to multiple compression fractures, including subacute compression fractures at T11 and T12 seen on an MRI earlier this month, with multiple lumbar vertebral augmentations, and posterolateral rod and pedicle screw fixation at L1-L2-L3 bilaterally. Because of the patient's back pain and generalized pain, she was not able to turn on the table for gravity maneuvering purposes of the barium column, and only had very limited mobility. On the table she was able to assume supine and LPO positions. Because the limitations imposed by her lack of mobility and pain, I modified my standard protocol. This results in some reduction in diagnostic sensitivity and specificity, and also increased the fluoroscopy time to compensate for the lack of projections. The initial KUB demonstrates postoperative findings in the lumbar  spine. Prominent stool throughout the colon favors constipation. Linear opacities at the left lung base favor subsegmental atelectasis or scarring. There is some mild blunting of the left lateral costophrenic angle. On the pharyngeal phase of swallowing there is a small Zenker's diverticulum and some minimal vallecular stasis. The Nissen fundoplication wrap extends just above the diaphragm compatible with small hiatal hernia. There is narrowing of the contrast column in the vicinity of the wrap  down to about 8 mm as shown on image 24/12. I was not able to distend the lumen beyond 8 mm in the vicinity of the wrap. A 13 mm barium tablet impacted just above the level of the wrap and did not proceed down into the stomach. Continuous primary peristaltic waves in the esophagus were observed on 3/4 swallowing attempts. Occasional tertiary and secondary contractions noted. The stomach and duodenum appear grossly normal. No obvious ulceration although sensitivity is reduced by patient mobility and pain. IMPRESSION: 1. Small hiatal hernia with fundoplication wrap extending above the diaphragm. In the vicinity of the wrap, the lumen could only be distended to about 8 mm in diameter, and a 13 mm barium tablet impacted just above the level of the wrap. It is possible that herniation of the wrap into the chest has cause some tethering which narrows the surrounded lumen. The narrowing in this vicinity is smooth. 2. Small Zenker's diverticulum. 3.  Prominent stool throughout the colon favors constipation. 4. Multiple compression fractures and kyphoplasties. The patient has subacute compression fractures at T11 and T12, and associated pain limited the patient's ability to turn as described above. Electronically Signed   By: Van Clines M.D.   On: 11/01/2017 10:13   Ir Kypho Thoracic With Bone Biopsy  Result Date: 11/06/2017 INDICATION: Severe low back pain secondary to compression fracture at T11. EXAM: BALLOON KYPHOPLASTY  AT T11 COMPARISON:  MRI of the lumbosacral spine of 10/10/2017. MEDICATIONS: As antibiotic prophylaxis, Ancef 2 g IV was ordered pre-procedure and administered intravenously within 1 hour of incision. ANESTHESIA/SEDATION: Moderate (conscious) sedation was employed during this procedure. A total of Versed 2 mg and Fentanyl 75 mcg, and Dilaudid 2 mg IV was administered intravenously. Moderate Sedation Time: 26 minutes. The patient's level of consciousness and vital signs were monitored continuously by radiology nursing throughout the procedure under my direct supervision. FLUOROSCOPY TIME:  Fluoroscopy Time: 11 minutes 30 seconds (1610 mGy) COMPLICATIONS: None immediate. PROCEDURE: Following a full explanation of the procedure along with the potential associated complications, an informed witnessed consent was obtained. The patient was placed prone on the fluoroscopic table. The skin overlying the thoracolumbar region was then prepped and draped in the usual sterile fashion. The right and left pedicles were then infiltrated with 0.25% bupivacaine followed by the advancement of an 11-gauge Jamshidi needle through both pedicles into the posterior one-third at T11. These were then exchanged for a Kyphon advanced osteo introducer system comprised of a working cannula and a Kyphon osteo drill in both pedicles. Combinations were then advanced over a Kyphon osteo bone pin until the tips of the Kyphon osteo drills were in the posterior third at T11. At this time, the bone pins were removed. In a medial trajectory, the combinations were advanced until the tips of the working cannula were inside the posterior one-third at T11. Through the working cannula a Kyphon inflatable bone tamp 20 x 3 were advanced and positioned with the distal marker 5 mm from the anterior aspect of T11. Crossing of the midline was seen on the AP projection. At this time, the balloons were expanded using contrast via a Kyphon inflation syringe device  via microtubing. Inflations were continued until there was apposition with the superior endplates. At this time, methylmethacrylate mixture was reconstituted with Tobramycin in the Kyphon bone mixing device system. This was then loaded onto the Kyphon bone fillers. The balloons were deflated and removed followed by the instillation of 5 bone filler equivalents of methylmethacrylate mixture at  T11 with excellent filling in the AP and lateral projections. No extravasation was noted in the disk spaces or posteriorly into the spinal canal. No epidural venous contamination was seen. The working cannula and the bone fillers were then retrieved and removed. Hemostasis was achieved at the skin entry sites. IMPRESSION: 1. Status post vertebral body augmentation using balloon kyphoplasty at T11 as described without event. Electronically Signed   By: Luanne Bras M.D.   On: 11/03/2017 15:12   Ct Renal Stone Study  Result Date: 11/08/2017 CLINICAL DATA:  RIGHT-sided flank pain with nausea and vomiting since yesterday suspected stone disease, history of colonic diverticulosis, stage III chronic kidney disease, asthma, coronary artery disease, hypertension, hiatal hernia, GERD EXAM: CT ABDOMEN AND PELVIS WITHOUT CONTRAST TECHNIQUE: Multidetector CT imaging of the abdomen and pelvis was performed following the standard protocol without IV contrast. COMPARISON:  04/05/2017 FINDINGS: Lower chest: Bibasilar atelectasis slightly greater on RIGHT. Hepatobiliary: Gallbladder surgically absent.  Liver unremarkable. Pancreas: Atrophic pancreas without mass Spleen: Normal appearance Adrenals/Urinary Tract: Adrenal glands normal appearance. Atrophic kidneys without mass or hydronephrosis. Normal appearing ureters and bladder. Small curvilinear metallic foreign body just below bladder base, of uncertain etiology. Stomach/Bowel: Appendix surgically absent by history. Diverticulosis of descending and sigmoid colon without evidence of  diverticulitis. Stomach demonstrates postsurgical changes from fundoplication with small persistent hiatal hernia. Bowel loops otherwise normal appearance. Vascular/Lymphatic: Atherosclerotic calcifications aorta without aneurysm. No adenopathy. Reproductive: Uterus surgically absent.  Atrophic ovaries. Other: No free air or free fluid. No acute inflammatory process. Tiny umbilical hernia containing fat. Musculoskeletal: Multiple prior thoracolumbar spine spinal augmentation procedures with postsurgical changes of posterior fusion L1-L3. Marked osseous demineralization. IMPRESSION: Distal colonic diverticulosis without evidence of diverticulitis. Small hiatal hernia with evidence of prior fundoplication. No acute intra-abdominal or intrapelvic abnormalities. Osseous demineralization with multiple thoracolumbar compression fractures and evidence of prior spinal augmentation procedures as well as L1-L3 spinal fusion. Electronically Signed   By: Lavonia Dana M.D.   On: 11/08/2017 16:57   Xr Lumbar Spine 2-3 Views  Result Date: 10/26/2017 Mild compression deformity T11, kyphoplasty T12, L2, L3 and L4, Anterolishesis L4-5 grade 1-2. Fusion L1 to L3 with Pedicle screws and rods. The height and alignment through the fusion segment is well maintained.      Subjective: Feeling better.  Restless legs last night, cramps, but no vomiting, tolerated soft diet.  No nausea overnight.  Mild back pain, well controlled with home meds.  Discharge Exam: Vitals:   11/11/17 0514 11/11/17 0613  BP: (!) 172/84 (!) 146/62  Pulse:    Resp:    Temp:    SpO2:     Vitals:   11/10/17 2227 11/11/17 0509 11/11/17 0514 11/11/17 0613  BP: (!) 151/79 (!) 161/77 (!) 172/84 (!) 146/62  Pulse: 70 66    Resp: 18 18    Temp: 99.2 F (37.3 C) 97.9 F (36.6 C)    TempSrc: Oral Oral    SpO2: 96% 96%    Weight:      Height:        General: Pt is alert, awake, not in acute distress, lying flat in bed. Cardiovascular: RRR,  S1/S2 +, no rubs, no gallops Respiratory: CTA bilaterally, no wheezing, no rhonchi Abdominal: Soft, NT, ND, bowel sounds +, mild tenderness in bilateral ribs, no guarding, no rebound Extremities: no edema, no cyanosis    The results of significant diagnostics from this hospitalization (including imaging, microbiology, ancillary and laboratory) are listed below for reference.  Microbiology: No results found for this or any previous visit (from the past 240 hour(s)).   Labs: BNP (last 3 results) Recent Labs    09/25/17 1843  BNP 245.8*   Basic Metabolic Panel: Recent Labs  Lab 11/06/17 2210 11/08/17 1148 11/09/17 0354 11/10/17 0447 11/11/17 0627  NA 139 138 138 140 142  K 3.8 4.1 3.7 3.5 3.0*  CL 105 102 109 111 108  CO2 23 23 24 24 25   GLUCOSE 97 138* 96 94 103*  BUN 12 11 9 8  6*  CREATININE 0.87 0.92 0.76 0.77 0.82  CALCIUM 9.6 9.8 8.3* 8.3* 9.0   Liver Function Tests: Recent Labs  Lab 11/08/17 1148  AST 33  ALT 30  ALKPHOS 100  BILITOT 0.9  PROT 7.7  ALBUMIN 4.3   Recent Labs  Lab 11/08/17 1148  LIPASE 17   No results for input(s): AMMONIA in the last 168 hours. CBC: Recent Labs  Lab 11/08/17 1148 11/09/17 0354 11/11/17 0627  WBC 8.2 7.0 5.9  NEUTROABS 6.6  --   --   HGB 15.0 13.1 13.9  HCT 47.0* 40.7 41.8  MCV 98.3 97.1 96.5  PLT 332 344 318   Cardiac Enzymes: No results for input(s): CKTOTAL, CKMB, CKMBINDEX, TROPONINI in the last 168 hours. BNP: Invalid input(s): POCBNP CBG: Recent Labs  Lab 11/09/17 0815 11/10/17 0814 11/10/17 1705 11/11/17 0829  GLUCAP 102* 94 134* 105*   D-Dimer No results for input(s): DDIMER in the last 72 hours. Hgb A1c No results for input(s): HGBA1C in the last 72 hours. Lipid Profile No results for input(s): CHOL, HDL, LDLCALC, TRIG, CHOLHDL, LDLDIRECT in the last 72 hours. Thyroid function studies No results for input(s): TSH, T4TOTAL, T3FREE, THYROIDAB in the last 72 hours.  Invalid input(s):  FREET3 Anemia work up No results for input(s): VITAMINB12, FOLATE, FERRITIN, TIBC, IRON, RETICCTPCT in the last 72 hours. Urinalysis    Component Value Date/Time   COLORURINE YELLOW 11/08/2017 1625   APPEARANCEUR CLEAR 11/08/2017 1625   LABSPEC 1.010 11/08/2017 1625   PHURINE 7.0 11/08/2017 1625   GLUCOSEU NEGATIVE 11/08/2017 1625   HGBUR NEGATIVE 11/08/2017 1625   BILIRUBINUR NEGATIVE 11/08/2017 1625   KETONESUR NEGATIVE 11/08/2017 1625   PROTEINUR NEGATIVE 11/08/2017 1625   UROBILINOGEN 0.2 09/19/2014 2235   NITRITE NEGATIVE 11/08/2017 1625   LEUKOCYTESUR NEGATIVE 11/08/2017 1625   Sepsis Labs Invalid input(s): PROCALCITONIN,  WBC,  LACTICIDVEN Microbiology No results found for this or any previous visit (from the past 240 hour(s)).   Time coordinating discharge: 25 minutes       SIGNED:   Edwin Dada, MD  [facility] 11/11/2017, 9:18 AM

## 2017-11-12 ENCOUNTER — Other Ambulatory Visit (INDEPENDENT_AMBULATORY_CARE_PROVIDER_SITE_OTHER): Payer: Self-pay | Admitting: Specialist

## 2017-11-12 DIAGNOSIS — R0781 Pleurodynia: Secondary | ICD-10-CM

## 2017-11-12 DIAGNOSIS — M546 Pain in thoracic spine: Secondary | ICD-10-CM

## 2017-11-13 NOTE — Telephone Encounter (Signed)
Diclofenac Gel Refill request

## 2017-11-14 ENCOUNTER — Ambulatory Visit (INDEPENDENT_AMBULATORY_CARE_PROVIDER_SITE_OTHER): Payer: PPO | Admitting: Specialist

## 2017-11-14 ENCOUNTER — Telehealth (INDEPENDENT_AMBULATORY_CARE_PROVIDER_SITE_OTHER): Payer: Self-pay | Admitting: Specialist

## 2017-11-14 ENCOUNTER — Other Ambulatory Visit: Payer: Self-pay

## 2017-11-14 ENCOUNTER — Ambulatory Visit: Payer: PPO | Admitting: Rheumatology

## 2017-11-14 ENCOUNTER — Encounter: Payer: Self-pay | Admitting: Rheumatology

## 2017-11-14 ENCOUNTER — Encounter (INDEPENDENT_AMBULATORY_CARE_PROVIDER_SITE_OTHER): Payer: Self-pay | Admitting: Specialist

## 2017-11-14 VITALS — BP 174/81 | HR 71 | Ht 63.0 in | Wt 161.0 lb

## 2017-11-14 VITALS — BP 134/77 | HR 63 | Resp 17 | Ht 62.5 in | Wt 164.0 lb

## 2017-11-14 DIAGNOSIS — Z9049 Acquired absence of other specified parts of digestive tract: Secondary | ICD-10-CM

## 2017-11-14 DIAGNOSIS — M5136 Other intervertebral disc degeneration, lumbar region: Secondary | ICD-10-CM

## 2017-11-14 DIAGNOSIS — M170 Bilateral primary osteoarthritis of knee: Secondary | ICD-10-CM | POA: Diagnosis not present

## 2017-11-14 DIAGNOSIS — Z9889 Other specified postprocedural states: Secondary | ICD-10-CM

## 2017-11-14 DIAGNOSIS — Z8639 Personal history of other endocrine, nutritional and metabolic disease: Secondary | ICD-10-CM

## 2017-11-14 DIAGNOSIS — Z9229 Personal history of other drug therapy: Secondary | ICD-10-CM

## 2017-11-14 DIAGNOSIS — Z8709 Personal history of other diseases of the respiratory system: Secondary | ICD-10-CM

## 2017-11-14 DIAGNOSIS — Z8781 Personal history of (healed) traumatic fracture: Secondary | ICD-10-CM | POA: Diagnosis not present

## 2017-11-14 DIAGNOSIS — M8000XD Age-related osteoporosis with current pathological fracture, unspecified site, subsequent encounter for fracture with routine healing: Secondary | ICD-10-CM

## 2017-11-14 DIAGNOSIS — M47819 Spondylosis without myelopathy or radiculopathy, site unspecified: Secondary | ICD-10-CM | POA: Diagnosis not present

## 2017-11-14 DIAGNOSIS — M810 Age-related osteoporosis without current pathological fracture: Secondary | ICD-10-CM | POA: Diagnosis not present

## 2017-11-14 DIAGNOSIS — Z8669 Personal history of other diseases of the nervous system and sense organs: Secondary | ICD-10-CM | POA: Diagnosis not present

## 2017-11-14 DIAGNOSIS — Z8659 Personal history of other mental and behavioral disorders: Secondary | ICD-10-CM

## 2017-11-14 DIAGNOSIS — Z8719 Personal history of other diseases of the digestive system: Secondary | ICD-10-CM

## 2017-11-14 DIAGNOSIS — S22000G Wedge compression fracture of unspecified thoracic vertebra, subsequent encounter for fracture with delayed healing: Secondary | ICD-10-CM | POA: Diagnosis not present

## 2017-11-14 DIAGNOSIS — R899 Unspecified abnormal finding in specimens from other organs, systems and tissues: Secondary | ICD-10-CM

## 2017-11-14 DIAGNOSIS — M546 Pain in thoracic spine: Secondary | ICD-10-CM

## 2017-11-14 DIAGNOSIS — Z8679 Personal history of other diseases of the circulatory system: Secondary | ICD-10-CM

## 2017-11-14 MED ORDER — NALOXEGOL OXALATE 25 MG PO TABS: 25.0000 mg | ORAL_TABLET | Freq: Every day | ORAL | 2 refills | Status: DC

## 2017-11-14 MED ORDER — ABALOPARATIDE 3120 MCG/1.56ML ~~LOC~~ SOPN: 80.0000 ug | PEN_INJECTOR | Freq: Every day | SUBCUTANEOUS | 2 refills | Status: DC

## 2017-11-14 NOTE — Patient Outreach (Signed)
[facility]) Care Management  11/14/2017  LENNYN GANGE [birth year] [id number]   82 year old female [facility] services for 30 day post discharge medication review.  PMHx includes, but not limited to, hypertension, GERD, osteoporosis, prediabetes and CKD Stage III.  Successful outreach attempt to Mrs. Duplessis.  Patient states that she was "not interest" in my services.    Joetta Manners, PharmD Clinical Pharmacist [place] [phone]

## 2017-11-14 NOTE — Patient Instructions (Addendum)
Plan: Avoid frequent bending and stooping  No lifting greater than 10 lbs. May use ice or moist heat for pain. Weight loss is of benefit. Handicap license is approved. You are breaking bones in your back with normal activities, this indicates that you have severely weakened bone, severe osteoporosis.  Keep your appointment with Dr. Jaynie Collins to develop a plan to improve the bone  Strength in your spine, you are not a candidate for fusions without stronger bone density To allow better internal fixation with screws and rods. Start Movantik 25 mg one time per day to 1-2 hours before eating breakfast, this is a medication used to counteract the affect of narcotic medications on your bowels, it decreases Constipation which may be part of why you hurt. A bone scan is ordered to assess the bone in the spine for any sign of other bone fracture. Neurologically your exam shows normal muscle function.  CPK level today to rule out a reaction to pravastatin. Try your thoracolumbar brace, if the spine fracture is the source of the pain bracing may help and it can be discontinued after the fracture heals.

## 2017-11-14 NOTE — Patient Instructions (Addendum)
Abaloparatide injection What is this medicine? ABALOPARATIDE (a ball oh PAR a tide) increases bone mass and strength. It helps make healthy bone and to slow bone loss. This medicine is used to prevent bone fracture. This medicine may be used for other purposes; ask your health care provider or pharmacist if you have questions. COMMON BRAND NAME(S): TYMLOS What should I tell my health care provider before I take this medicine? They need to know if you have any of these conditions: -bone disease other than osteoporosis -high levels of calcium in the blood -high levels of an enzyme called alkaline phosphatase in the blood -history of cancer in the bone -kidney stone -Paget's disease -parathyroid disease -receiving radiation therapy -an unusual or allergic reaction to abaloparatide, other medicines, foods, dyes, or preservatives -pregnant or trying to get pregnant -breast-feeding How should I use this medicine? This medicine is for injection under the skin. You will be taught how to prepare and give this medicine. Use exactly as directed. Take your medicine at regular intervals. Do not take your medicine more often than directed. It is important that you put your used needles and pens in a special sharps container. Do not put them in a trash can. If you do not have a sharps container, call your pharmacist or health care provider to get one. A special MedGuide will be given to you by the pharmacist with each prescription and refill. Be sure to read this information carefully each time. Talk to your pediatrician regarding the use of this medicine in children. Special care may be needed. Overdosage: If you think you have taken too much of this medicine contact a poison control center or emergency room at once. NOTE: This medicine is only for you. Do not share this medicine with others. What if I miss a dose? If you miss a dose, take it as soon as you can. If it is almost time for your next dose,  take only that dose. Do not take double or extra doses. What may interact with this medicine? Interactions have not been studied. This list may not describe all possible interactions. Give your health care provider a list of all the medicines, herbs, non-prescription drugs, or dietary supplements you use. Also tell them if you smoke, drink alcohol, or use illegal drugs. Some items may interact with your medicine. What should I watch for while using this medicine? Visit your doctor or health care professional for regular checks on your progress. You may need blood work done while you are taking this medicine. You may get dizzy. Do not drive, use machinery, or do anything that needs mental alertness until you know how this medicine affects you. Do not stand or sit up quickly, especially if you are an older patient. This reduces the risk of dizzy or fainting spells. Avoid alcoholic drinks; they can make you more dizzy. You should make sure that you get enough calcium and vitamin D while you are taking this medicine. Discuss the foods you eat and the vitamins you take with your health care professional. Talk to your doctor about your risk of cancer. You may be more at risk for certain types of cancers if you take this medicine. What side effects may I notice from receiving this medicine? Side effects that you should report to your doctor or health care professional as soon as possible: -allergic reactions like skin rash, itching or hives, swelling of the face, lips, or tongue -blood in the urine; pain in the lower back   or side; pain when urinating -signs and symptoms of low blood pressure like dizziness; feeling faint or lightheaded, falls; unusually weak or tired -signs and symptoms of increased calcium like nausea; vomiting; constipation; low energy; or muscle weakness Side effects that usually do not require medical attention (report these to your doctor or health care professional if they continue or  are bothersome): -headache -fast, irregular heartbeat -nausea -pain, redness, irritation or swelling at the injection site -stomach upset or pain -tiredness This list may not describe all possible side effects. Call your doctor for medical advice about side effects. You may report side effects to FDA at 1-800-FDA-1088. Where should I keep my medicine? Keep out of the reach of children. Store unopened pens in the refrigerator between 2 and 8 degrees C (36 and 46 degrees F). Do not freeze. After first use, store your pen for up to 30 days at room temperature between 20 and 25 degrees C (68 and 77 degrees F). Avoid exposure to heat. Throw away any unused medicine after the expiration date on the label. NOTE: This sheet is a summary. It may not cover all possible information. If you have questions about this medicine, talk to your doctor, pharmacist, or health care provider.  2018 [employer] Standard (2015-09-14 10:03:05)  

## 2017-11-14 NOTE — Telephone Encounter (Signed)
3 week f/u  Sched pt 12/29/17 @ 9:45am

## 2017-11-14 NOTE — Progress Notes (Addendum)
Office Visit Note   Patient: [name] [name]           Date of Birth: 1935/10/26           MRN: [id number] Visit Date: 11/14/2017              Requested by: [name], [address] [place], [place] [id number] PCP: [name], MD   Assessment & Plan: Visit Diagnoses:  1. Pain in thoracic spine   2. Closed compression fracture of thoracic vertebra with delayed healing, subsequent encounter   3. Age-related osteoporosis with current pathological fracture with routine healing, subsequent encounter   4. History of statin therapy     Plan: Avoid frequent bending and stooping  No lifting greater than 10 lbs. May use ice or moist heat for pain. Weight loss is of benefit. Handicap license is approved. You are breaking bones in your back with normal activities, this indicates that you have severely weakened bone, severe osteoporosis.  Keep your appointment with Dr. Jaynie Collins to develop a plan to improve the bone  Strength in your spine, you are not a candidate for fusions without stronger bone density To allow better internal fixation with screws and rods. Start Movantik 25 mg one time per day to 1-2 hours before eating breakfast, this is a medication used to counteract the affect of narcotic medications on your bowels, it decreases Constipation which may be part of why you hurt. A bone scan is ordered to assess the bone in the spine for any sign of other bone fracture. Neurologically your exam shows normal muscle function.  CPK level today to rule out a reaction to pravastatin. Try your thoracolumbar brace, if the spine fracture is the source of the pain bracing may help and it can be discontinued after the fracture heals.  Follow-Up Instructions: Return in about 3 weeks (around 12/05/2017).   Orders:  Orders Placed This Encounter  Procedures  . NM Bone Scan 3 Phase  . CK (Creatine Kinase)   Meds ordered this encounter  Medications  .  naloxegol oxalate (MOVANTIK) 25 MG TABS tablet    Sig: Take 1 tablet (25 mg total) by mouth daily.    Dispense:  30 tablet    Refill:  2      Procedures: No procedures performed   Clinical Data: Findings:  EXAM: DUAL X-RAY ABSORPTIOMETRY (DXA) FOR BONE MINERAL DENSITY  IMPRESSION: Referring Physician:  Vicie Mutters  PATIENT: Name: [name] [name], [name] [name] Patient ID: [id number] Birth Date: 1935-05-13 Height: 63.0 in. Sex: Female Measured: 07/08/2016 Weight: 144.9 lbs. Indications: Advanced Age, Caucasian, Estrogen Deficient, Hysterectomy, Postmenopausal Fractures: None Treatments: Calcium (E943.0), Fosamax, Vitamin D (E933.5)  ASSESSMENT: The BMD measured at Femur Neck Left is 0.652 g/cm2 with a T-score of -2.8. This patient is considered osteoporotic according to [facility]) criteria. Lumbar spine was not utilized due to surgical hardware. Per the official positions of the ISCD, it is not possible to quantitatively compare BMD or calculate an [facility] between exams done at different facilities.  Site Region Measured Date Measured Age YA BMD Significant CHANGE T-score DualFemur Neck Left 07/08/2016 78.6 -2.10 0.652 g/cm2  Left Forearm Radius 33% 07/08/2016 78.6 -1.8 0.731 g/cm2  World Health Organization [facility]) criteria for post-menopausal, Caucasian Women: Normal       T-score at or above -1 SD Osteopenia   T-score between -1 and -2.5 SD Osteoporosis T-score at or below -2.5 SD  RECOMMENDATION: [place] recommends that  FDA-approved medical therapies be considered in postmenopausal women and men age 48 or older with a:  1. Hip or vertebral (clinical or morphometric) fracture. 2. T-score of <-2.5 at the spine or hip. 3. Ten-year fracture probability by FRAX of 3% or greater for hip fracture or 20% or greater for major osteoporotic fracture.  All treatment decisions require clinical judgment and consideration of individual  patient factors, including patient preferences, co-morbidities, previous drug use, risk factors not captured in the FRAX model (e.g. falls, vitamin D deficiency, increased bone turnover, interval significant decline in bone density) and possible under - or over-estimation of fracture risk by FRAX.  All patients should ensure an adequate intake of dietary calcium (1200 mg/d) and vitamin D (800 IU daily) unless contraindicated.  FOLLOW-UP: People with diagnosed cases of osteoporosis or at high risk for fracture should have regular bone mineral density tests. For patients eligible for Medicare, routine testing is allowed once every 2 years. The testing frequency can be increased to one year for patients who have rapidly progressing disease, those who are receiving or discontinuing medical therapy to restore bone mass, or have additional risk factors.  I have reviewed this report, and agree with the above findings. [facility]   Electronically Signed   By: Lahoma Crocker M.D.   On: 07/08/2016 10:15  Recent Sed rate and SEP are negative for multiple myeloma.    Subjective: Chief Complaint  Patient presents with  . Middle Back - Follow-up    Thoracic spine MRI Review  . Lower Back - Follow-up    Lumbar Spine MRI review    82 year old female with history of osteoporosis, DDD, with multiple compression fractures of nearly every lumbar level T12 through L4. She has undergone multiple lumbar vertebroplasties/kyphoplasties last on 11/03/2017 with biopsy. Injected with cement and a biopsy was done. The results of the biopsy are negative for multiple myeloma. She has recently been to the ER with severe right flank pain. Pain along the right inferior rib border. Husband is concerned about the degree of discomfort she is experiencing and what the source is. She is suffering from severe constipation and is on miralax. Has not tried movanek or linzess. She is seeing Dr. Hardin Negus for  pain control, saw him last month 6/25 and she is taking hydrocodone 5/325 every 4 hours one tablet. She is currently on a kidney pill from the urologist Dr. Sherre Poot, rapaflo 19m cap every morning. Takes norvasc Cozaar and metoprolol for antiHTN. On pravastin for anticholesteral effect. She reports that she was told it was her sciatic nerve.    Review of Systems  Constitutional: Negative.   HENT: Negative.   Eyes: Negative.   Respiratory: Negative.   Cardiovascular: Negative.   Gastrointestinal: Negative.   Endocrine: Negative.   Genitourinary: Negative.   Musculoskeletal: Negative.   Skin: Negative.   Allergic/Immunologic: Negative.   Neurological: Negative.   Hematological: Negative.   Psychiatric/Behavioral: Negative.      Objective: Vital Signs: BP (!) 174/81 (BP Location: Left Arm, Patient Position: Sitting)   Pulse 71   Ht '5\' 3"'  (1.6 m)   Wt 161 lb (73 kg)   BMI 28.52 kg/m   Physical Exam  Constitutional: She is oriented to person, place, and time. She appears well-developed and well-nourished.  HENT:  Head: Normocephalic and atraumatic.  Eyes: Pupils are equal, round, and reactive to light. EOM are normal.  Neck: Normal range of motion. Neck supple.  Pulmonary/Chest: Effort normal and breath sounds normal.  Abdominal: Soft. Bowel sounds are normal.  Neurological: She is alert and oriented to person, place, and time.  Skin: Skin is warm and dry.  Psychiatric: She has a normal mood and affect. Her behavior is normal. Judgment and thought content normal.    Back Exam   Tenderness  The patient is experiencing tenderness in the lumbar.  Range of Motion  Extension: abnormal  Flexion: abnormal  Lateral bend right: abnormal  Lateral bend left: abnormal  Rotation right: abnormal  Rotation left: abnormal   Muscle Strength  Right Quadriceps:  5/5  Left Quadriceps:  5/5  Right Hamstrings:  5/5  Left Hamstrings:  5/5   Tests  Straight leg raise right:  negative Straight leg raise left: negative  Reflexes  Achilles: normal Babinski's sign: normal   Other  Toe walk: normal Heel walk: normal Sensation: normal Gait: abnormal  Erythema: no back redness Scars: present      Specialty Comments:  No specialty comments available.  Imaging: No results found.   PMFS History: Patient Active Problem List   Diagnosis Date Noted  . Lumbar radiculopathy, acute 12/07/2015    Priority: High    Class: Chronic  . Herniated lumbar intervertebral disc 06/25/2012    Priority: High    Class: Acute  . Intractable nausea and vomiting 11/08/2017  . CKD (chronic kidney disease), stage III ([place]) 09/27/2017  . Low back pain 09/26/2017  . T12 compression fracture ([place]) 09/26/2017  . Chronic bronchitis ([place]) 08/09/2017  . Atherosclerosis of aorta ([place]) 04/05/2017  . Costochondritis 11/14/2016  . Anxiety state 06/08/2016  . History of lumbar spinal fusion 04/13/2016  . Spondylolisthesis, lumbar region 04/13/2016  . Leukocytosis 03/28/2016  . Dysphagia   . Spinal stenosis, lumbar region, with neurogenic claudication 05/29/2015  . DDD (degenerative disc disease), lumbar 04/21/2015  . Mixed hyperlipidemia 11/24/2014  . GERD  11/24/2014  . Depression, major, in partial remission ([place]) 09/26/2014  . Chronic pain syndrome 09/26/2014  . Coronary artery disease due to lipid rich plaque   . Osteoporosis 02/10/2014  . Prediabetes 08/14/2013  . Vitamin D deficiency 08/14/2013  . Medication management 08/14/2013  . Constipation, chronic 04/03/2013  . MGUS (monoclonal gammopathy of unknown significance) 03/05/2013  . Vertebral compression fracture ([place]) 06/23/2012  . ESOPHAGEAL STRICTURE 08/28/2008  . Mild intermittent asthma without complication 49/70/2637  . Incarcerated recurrent paraesophageal hiatal hernia s/p lap redo repair [id number] 01/31/2008  . Essential hypertension 07/25/2007   Past Medical History:  Diagnosis Date  . Anxiety     takes Xanax daily as needed  . Asthma    Albuterol daily as needed  . Blood transfusion without reported diagnosis   . CAD (coronary artery disease)    LHC 2003 with 40% pLAD, 30% mLAD, 100% D1 (moderate vessel), 100%  D2 (small vessel), 95% mid small OM2.  Pt had PTCA to D1;  D2 and OM2 small vessels and tx medically;  Last Myoview (2012): anterior infarct seen with scar, no ischemia. EF normal. Pt managed medically;  Lexiscan Myoview (12/14):  Low risk; ant scar with very mild peri-infarct ischemia; EF 55% with ant AK   . Cataract   . Chronic back pain    HNP, DDD, spinal stenosis, vertebral compression fractures.   . Chronic nausea    takes Zofran daily as needed.  normal gastric emptying study 03/2013  . CKD (chronic kidney disease), stage III ([place]) 09/27/2017  . Constipation    felt to be functional. takes Senokot and Miralax daily.  treated with Linzess in past.   . DIVERTICULOSIS, COLON 08/28/2008   Qualifier: Diagnosis of  By: [name], [name], [name]    . Elevated LFTs 2015   probably med induced DILI, from Augmentin vs Pravastatin  . GERD (gastroesophageal reflux disease)    hx of esophageal stricture   . Hiatal hernia    Paraesophageal hernias. s/p fundoplications in 6269 and 04/2013  . History of bronchitis several of yrs ago  . History of colon polyps 03/2009, 03/2011   adenomatous colon polyps, no high grade dysplasia.   [name] History of vertigo    no meds  . Hyperlipidemia    takes Pravastatin daily  . Hypertension    takes Amlodipine,Metoprolol,and Losartan daily  . Nausea 03/2016  . Osteoporosis   . PONV (postoperative nausea and vomiting)    yrs ago  . Slow urinary stream    takes Rapaflo daily  . TIA (transient ischemic attack) 1995   no residual effects noted    Family History  Problem Relation Age of Onset  . Colon cancer Mother 67  . Heart disease Father 41       heart attack  . Brain cancer Brother        tumor   . Heart disease Brother   . Heart disease  Brother   . Kidney disease Daughter     Past Surgical History:  Procedure Laterality Date  . APPENDECTOMY     patient unsure of date  . BACK SURGERY    . BLADDER REPAIR    . CHOLECYSTECTOMY     Patient unsure of date.  . CORONARY ANGIOPLASTY  11/16/2001   1 stent  . ESOPHAGEAL MANOMETRY N/A 03/25/2013   Procedure: ESOPHAGEAL MANOMETRY (EM);  Surgeon: Inda Castle, MD;  Location: [facility] ENDOSCOPY;  Service: Endoscopy;  Laterality: N/A;  . ESOPHAGOGASTRODUODENOSCOPY N/A 03/15/2013   Procedure: ESOPHAGOGASTRODUODENOSCOPY (EGD);  Surgeon: Jerene Bears, MD;  Location[facility] ENDOSCOPY;  Service: Endoscopy;  Laterality: N/A;  . ESOPHAGOGASTRODUODENOSCOPY N/A 12/25/2015   Procedure: ESOPHAGOGASTRODUODENOSCOPY (EGD);  Surgeon: Doran Stabler, MD;  Location: [facility] ENDOSCOPY;  Service: Endoscopy;  Laterality: N/A;  . EYE SURGERY  11/2000   bilateral cataracts with lens implant  . FIXATION KYPHOPLASTY LUMBAR SPINE  X 2   "L3-4"  . HEMORRHOID SURGERY    . HIATAL HERNIA REPAIR  06/03/2011   Procedure: LAPAROSCOPIC REPAIR OF HIATAL HERNIA;  Surgeon: Adin Hector, MD;  Location: [facility] ORS;  Service: General;  Laterality: N/A;  . HIATAL HERNIA REPAIR N/A 04/24/2013   Procedure: LAPAROSCOPIC  REPAIR RECURRENT PARASOPHAGEAL HIATAL HERNIA WITH FUNDOPLICATION;  Surgeon: Adin Hector, MD;  Location: [facility] ORS;  Service: General;  Laterality: N/A;  . INSERTION OF MESH N/A 04/24/2013   Procedure: INSERTION OF MESH;  Surgeon: Adin Hector, MD;  Location: [facility] ORS;  Service: General;  Laterality: N/A;  . IR KYPHO THORACIC WITH BONE BIOPSY  09/29/2017  . IR KYPHO THORACIC WITH BONE BIOPSY  11/03/2017  . LAPAROSCOPIC LYSIS OF ADHESIONS N/A 04/24/2013   Procedure: LAPAROSCOPIC LYSIS OF ADHESIONS;  Surgeon: Adin Hector, MD;  Location: [facility] ORS;  Service: General;  Laterality: N/A;  . LAPAROSCOPIC NISSEN FUNDOPLICATION  4/85/4627   Procedure: LAPAROSCOPIC NISSEN FUNDOPLICATION;  Surgeon: Adin Hector, MD;   Location: [facility] ORS;  Service: General;  Laterality: N/A;  . LUMBAR FUSION  12/07/2015   Right L2-3 facetectomy, posterolateral fusion L2-3 with pedicle screws, rods, local bone graft, Vivigen cancellous chips. Fusion extended to  the L1 level with pedicle screws and rods  . LUMBAR LAMINECTOMY/DECOMPRESSION MICRODISCECTOMY Right 06/26/2012   Procedure: LUMBAR LAMINECTOMY/DECOMPRESSION MICRODISCECTOMY;  Surgeon: Jessy Oto, MD;  Location: [facility];  Service: Orthopedics;  Laterality: Right;  RIGHT L4-5 MICRODISCECTOMY  . LUMBAR LAMINECTOMY/DECOMPRESSION MICRODISCECTOMY N/A 05/29/2015   Procedure: RIGHT L2-3 MICRODISCECTOMY;  Surgeon: Jessy Oto, MD;  Location: [facility];  Service: Orthopedics;  Laterality: N/A;  . TOTAL ABDOMINAL HYSTERECTOMY  1975   partial   Social History   Occupational History  . Occupation: Retired    Fish farm manager: RETIRED    Comment: worked at [employer]  . Smoking status: Never Smoker  . Smokeless tobacco: Never Used  Substance and Sexual Activity  . Alcohol use: No  . Drug use: No  . Sexual activity: Never

## 2017-11-15 ENCOUNTER — Telehealth: Payer: Self-pay | Admitting: *Deleted

## 2017-11-15 NOTE — Telephone Encounter (Signed)
Called patient on 11/15/2017 , 4:10 PM in an attempt to reach the patient for a hospital follow up.   Admit date: 11/08/17 Discharge: 11/11/17   She DOES NOT  have any questions or concerns about medications from the hospital admission. The patient's medications were reviewed over the phone, they were counseled to bring in all current medications to the hospital follow up visit.   I advised the patient to call if any questions or concerns arise about the hospital admission or medications    Home health WAS NOT  started in the hospital.  All questions were answered and a follow up appointment was made.   Prior to Admission medications   Medication Sig Start Date End Date Taking? Authorizing Provider  Abaloparatide (TYMLOS) 3120 MCG/1.56ML SOPN Inject 80 mcg into the skin daily. 11/14/17   [name], MD  acetaminophen (TYLENOL) 500 MG tablet Take 1,000 mg by mouth every 6 (six) hours as needed for mild pain.    [provider]  albuterol (PROAIR HFA) 108 (90 Base) MCG/ACT inhaler Inhale 2 puffs into the lungs every 6 (six) hours as needed for wheezing or shortness of breath. 09/22/16   Vicie Mutters, PA-C  ALPRAZolam Duanne Moron) 0.5 MG tablet Take 1/2 to 1 tablet 2 to 3 x / day ONLY if Anxiety Attack and please try to limit to 5 days /week to avoid addiction Patient taking differently: Take 0.25-0.5 mg by mouth 3 (three) times daily as needed for anxiety.  08/14/17   [name], MD  amLODipine (NORVASC) 10 MG tablet TAKE 1 TABLET BY MOUTH AT BEDTIME 09/08/17   [name], MD  Cholecalciferol (VITAMIN D3) 5000 units CAPS Take 5,000 Units by mouth 2 (two) times daily.     [provider]  diclofenac sodium (VOLTAREN) 1 % GEL APPLY 4 G TOPICALLY FOUR TIMES DAILY Patient taking differently: APPLY 4 G TOPICALLY FOUR TIMES DAILY AS NEEDED 11/03/17   Jessy Oto, MD  diclofenac sodium (VOLTAREN) 1 % GEL Apply 4 g topically 4 (four) times daily as needed (pain). Patient not  taking: Reported on 11/14/2017 11/13/17   Jessy Oto, MD  dicyclomine (BENTYL) 10 MG capsule TAKE 1 CAPSULE BY MOUTH EVERY 8 HOURS AS NEEDED FOR SPASM 08/04/17   Armbruster, Carlota Raspberry, MD  gabapentin (NEURONTIN) 300 MG capsule TAKE 1 CAPSULE BY MOUTH THREE TIMES DAILY FOR  CHRONIC  PAIN. 10/06/17   [name], MD  ipratropium-albuterol (DUONEB) 0.5-2.5 (3) MG/3ML SOLN USE ONE VIAL IN NEBULIZER 4 TIMES DAILY Patient taking differently: Take 3 mLs by nebulization every 6 (six) hours as needed (wheezing). USE ONE VIAL IN NEBULIZER 4 TIMES DAILY as needed 08/09/17   [name], NP  ketoconazole (NIZORAL) 2 % cream APPLY 1 APPLICATION TOPICALLY TWO TIMES DAILY Patient taking differently: APPLY 1 APPLICATION TOPICALLY TWO TIMES DAILY AS NEEDED FOR IRRITATION 02/10/17   Vicie Mutters, PA-C  losartan (COZAAR) 100 MG tablet TAKE ONE TABLET BY MOUTH ONCE DAILY (DISCONTINUE  BENICAR) 06/22/17   [name], MD  MAGNESIUM PO Take by mouth daily.    [provider]  meclizine (ANTIVERT) 25 MG tablet 1/2-1 pill up to 3 times daily for motion sickness/dizziness Patient taking differently: Take 12.5-25 mg by mouth 3 (three) times daily as needed for dizziness. 1/2-1 pill up to 3 times daily for motion sickness/dizziness 06/08/16   Vicie Mutters, PA-C  methocarbamol (ROBAXIN) 500 MG tablet Take 1 tablet (500 mg total) by mouth every 6 (six) hours as needed  for muscle spasms. 11/07/17   Prescilla Sours, MD  metoprolol tartrate (LOPRESSOR) 50 MG tablet Take 50 mg by mouth at bedtime.    [provider]  mometasone-formoterol (DULERA) 100-5 MCG/ACT AERO Inhale 2 puffs into the lungs every 12 (twelve) hours. Patient taking differently: Inhale 2 puffs into the lungs every 12 (twelve) hours as needed for wheezing or shortness of breath.  09/08/17   [name], MD  naloxegol oxalate (MOVANTIK) 25 MG TABS tablet Take 1 tablet (25 mg total) by mouth daily. 11/14/17   Jessy Oto, MD   neomycin-polymyxin b-dexamethasone (MAXITROL) 3.5-10000-0.1 OINT APPLY TO STYE FOUR TIMES DAILY. Patient taking differently: Place 1 application into both eyes See admin instructions. APPLY TO STYE FOUR TIMES DAILY AS NEEDED FOR EYE STYE 07/07/17   Vicie Mutters, PA-C  nitroGLYCERIN (NITROSTAT) 0.4 MG SL tablet Place 1 tablet (0.4 mg total) under the tongue every 5 (five) minutes as needed for chest pain. 09/04/14   Larey Dresser, MD  ondansetron (ZOFRAN-ODT) 8 MG disintegrating tablet DISSOLVE ONE TABLET IN MOUTH EVERY 8 HOURS AS NEEDED FOR NAUSEA AND  VOMITING 10/05/17   [name], NP  OVER THE COUNTER MEDICATION Take 1,000 mg by mouth 2 (two) times daily. [place] Calcium 1000mg     [provider]  oxyCODONE-acetaminophen (PERCOCET/ROXICET) 5-325 MG tablet Take 1 tablet by mouth every 4 (four) hours as needed for severe pain. 03/10/17   Tegeler, Gwenyth Allegra, MD  polyethylene glycol powder (GLYCOLAX/MIRALAX) powder Take 17 g by mouth 2 (two) times daily. Patient taking differently: Take 17 g by mouth 2 (two) times daily as needed for mild constipation or moderate constipation.  05/16/17   Armbruster, Carlota Raspberry, MD  pravastatin (PRAVACHOL) 40 MG tablet Take 1 tablet (40 mg total) by mouth daily. 07/31/17   [name], MD  promethazine (PHENERGAN) 25 MG suppository Place 1 suppository (25 mg total) rectally every 6 (six) hours as needed for vomiting. Patient not taking: Reported on 11/14/2017 11/11/17   Edwin Dada, MD  promethazine (PHENERGAN) 25 MG tablet Take 1 tablet (25 mg total) by mouth every 6 (six) hours as needed for nausea or vomiting. 11/11/17   Danford, Suann Larry, MD  senna-docusate (SENOKOT-S) 8.6-50 MG tablet Take 2 tablets by mouth 2 (two) times daily. 12/26/15   Roxan Hockey, MD  silodosin (RAPAFLO) 8 MG CAPS capsule Take 8 mg by mouth daily with breakfast.     [provider]

## 2017-11-16 ENCOUNTER — Telehealth: Payer: Self-pay

## 2017-11-16 ENCOUNTER — Ambulatory Visit: Payer: Self-pay | Admitting: Internal Medicine

## 2017-11-16 NOTE — Telephone Encounter (Signed)
11/30/17 at 230 pm appt with Janett Billow.  No answer and no voice mail.

## 2017-11-16 NOTE — Telephone Encounter (Signed)
Zehr, Laban Emperor, PA-C  Timothy Lasso, RN        Will you please schedule this patient to come back in and see me to discuss her esophagram and re-evaluate her symptoms to see if we should do EGD or not. I know she had some recent intervention on her back to so want to see how that it is as well as it could affect [place] doing any procedure.   Thank you,   Jess

## 2017-11-17 NOTE — Telephone Encounter (Signed)
I put her on the cancellation list we will call if something opens up

## 2017-11-17 NOTE — Telephone Encounter (Signed)
The patient has been notified of this information and all questions answered.

## 2017-11-20 ENCOUNTER — Ambulatory Visit: Payer: Self-pay | Admitting: Internal Medicine

## 2017-11-22 ENCOUNTER — Encounter (HOSPITAL_COMMUNITY)
Admission: RE | Admit: 2017-11-22 | Discharge: 2017-11-22 | Disposition: A | Discharge status: Home / Self Care | Payer: PPO | Source: Ambulatory Visit | Attending: Specialist | Admitting: Specialist

## 2017-11-22 ENCOUNTER — Other Ambulatory Visit (INDEPENDENT_AMBULATORY_CARE_PROVIDER_SITE_OTHER): Payer: Self-pay | Admitting: Specialist

## 2017-11-22 DIAGNOSIS — R948 Abnormal results of function studies of other organs and systems: Secondary | ICD-10-CM | POA: Diagnosis not present

## 2017-11-22 DIAGNOSIS — X58XXXA Exposure to other specified factors, initial encounter: Secondary | ICD-10-CM | POA: Insufficient documentation

## 2017-11-22 DIAGNOSIS — M546 Pain in thoracic spine: Secondary | ICD-10-CM

## 2017-11-22 DIAGNOSIS — S22000G Wedge compression fracture of unspecified thoracic vertebra, subsequent encounter for fracture with delayed healing: Secondary | ICD-10-CM

## 2017-11-22 DIAGNOSIS — M8000XD Age-related osteoporosis with current pathological fracture, unspecified site, subsequent encounter for fracture with routine healing: Secondary | ICD-10-CM | POA: Diagnosis not present

## 2017-11-22 MED ORDER — TECHNETIUM TC 99M MEDRONATE IV KIT
20.0000 | PACK | Freq: Once | INTRAVENOUS | Status: AC | PRN
  Administered 2017-11-22: 20 via INTRAVENOUS

## 2017-11-23 ENCOUNTER — Ambulatory Visit (INDEPENDENT_AMBULATORY_CARE_PROVIDER_SITE_OTHER): Payer: PPO | Admitting: Internal Medicine

## 2017-11-23 ENCOUNTER — Encounter: Payer: Self-pay | Admitting: Internal Medicine

## 2017-11-23 VITALS — BP 140/80 | HR 102 | Temp 97.2°F | Resp 14 | Ht 62.5 in | Wt 160.2 lb

## 2017-11-23 DIAGNOSIS — R112 Nausea with vomiting, unspecified: Secondary | ICD-10-CM | POA: Diagnosis not present

## 2017-11-23 DIAGNOSIS — T887XXA Unspecified adverse effect of drug or medicament, initial encounter: Secondary | ICD-10-CM

## 2017-11-23 DIAGNOSIS — I2583 Coronary atherosclerosis due to lipid rich plaque: Secondary | ICD-10-CM | POA: Diagnosis not present

## 2017-11-23 DIAGNOSIS — E876 Hypokalemia: Secondary | ICD-10-CM

## 2017-11-23 DIAGNOSIS — E782 Mixed hyperlipidemia: Secondary | ICD-10-CM

## 2017-11-23 DIAGNOSIS — N183 Chronic kidney disease, stage 3 unspecified: Secondary | ICD-10-CM

## 2017-11-23 DIAGNOSIS — I2510 Atherosclerotic heart disease of native coronary artery without angina pectoris: Secondary | ICD-10-CM | POA: Diagnosis not present

## 2017-11-23 DIAGNOSIS — I10 Essential (primary) hypertension: Secondary | ICD-10-CM | POA: Diagnosis not present

## 2017-11-23 DIAGNOSIS — R7303 Prediabetes: Secondary | ICD-10-CM

## 2017-11-23 DIAGNOSIS — J4520 Mild intermittent asthma, uncomplicated: Secondary | ICD-10-CM

## 2017-11-23 DIAGNOSIS — G8929 Other chronic pain: Secondary | ICD-10-CM | POA: Diagnosis not present

## 2017-11-23 NOTE — Progress Notes (Signed)
[place]     This very nice 82 y.o. MWF was admitted to the hospital on  11/08/2017 and patient was discharged from the hospital on  11/11/2017. The patient now presents for follow up for transition from recent hospitalization.  The day after discharge our clinical staff contacted the patient to assure stability and schedule a follow-up appointment. The discharge summary, medications and diagnostic test results were reviewed before meeting with the patient. The patient was admitted for:   Intractable vomiting with nausea Coronary artery disease due to lipid rich plaque Essential hypertension  Hypokalemia  Mild intermittent asthma Chronic pain Prediabetes Hyperlipidemia, mixed CKD (chronic kidney disease) stage 3     Patient was treated with IVF and IV antiemetics and as sx's improved and diet was restarted, advanced & tolerate, she was discharged for f/u as an out-patient. Since home, she reports daily nausea w/o emesia and has had better control of her nausea with Zofran than phenergan. Patient has hx/o repair of a large paraesophageal hernia in 2013 & 2014. She plans f/u with Dr Havery Moros.  Patient recently underwent a 2sd kyphoplasty. Husband expresses concern to & with patient regarding her use of Opioids which he relates that he feels is excessive and unjustified use.      Hospitalization discharge instructions and medications are reconciled with the patient.      Patient is also followed with Hypertension, Hyperlipidemia, Pre-Diabetes and Vitamin D Deficiency.      Patient is treated for HTN (1986) & BP has been controlled at home. Today's BP is at goal - 140/80.  In 2003, she underwent PCA/Stenting for ACS. Stress Myoview was negative in 2013. Patient has had no complaints of any cardiac type chest pain, palpitations, dyspnea/orthopnea/PND, dizziness, claudication, or dependent edema.     Hyperlipidemia is controlled with diet & meds. Patient denies myalgias or other med  SE's. Last Lipids were at goal: Lab Results  Component Value Date   CHOL 169 07/25/2017   HDL 83 07/25/2017   LDLCALC 65 07/25/2017   TRIG 131 07/25/2017   CHOLHDL 2.0 07/25/2017      Also, the patient has history of PreDiabetes (A1c 6.1%/2013) and has had no symptoms of reactive hypoglycemia, diabetic polys, paresthesias or visual blurring.  Last A1c was near goal: Lab Results  Component Value Date   HGBA1C 5.7 (H) 07/25/2017      Further, the patient also has history of Vitamin D Deficiency ("25"/2008) and supplements vitamin D without any suspected side-effects. Last vitamin D was still low: Lab Results  Component Value Date   VD25OH 48 07/25/2017   Current Outpatient Medications on File Prior to Visit  Medication Sig  . Abaloparatide (TYMLOS) 3120 MCG/1.56ML SOPN Inject 80 mcg into the skin daily.  [name] acetaminophen (TYLENOL) 500 MG tablet Take 1,000 mg by mouth every 6 (six) hours as needed for mild pain.  [name] albuterol (PROAIR HFA) 108 (90 Base) MCG/ACT inhaler Inhale 2 puffs into the lungs every 6 (six) hours as needed for wheezing or shortness of breath.  . ALPRAZolam (XANAX) 0.5 MG tablet Take 1/2 to 1 tablet 2 to 3 x / day ONLY if Anxiety Attack and please try to limit to 5 days /week to avoid addiction (Patient taking differently: Take 0.25-0.5 mg by mouth 3 (three) times daily as needed for anxiety. )  . amLODipine (NORVASC) 10 MG tablet TAKE 1 TABLET BY MOUTH AT BEDTIME  . Cholecalciferol (VITAMIN D3) 5000 units CAPS Take 5,000 Units by  mouth 2 (two) times daily.   . diclofenac sodium (VOLTAREN) 1 % GEL APPLY 4 G TOPICALLY FOUR TIMES DAILY (Patient taking differently: APPLY 4 G TOPICALLY FOUR TIMES DAILY AS NEEDED)  . dicyclomine (BENTYL) 10 MG capsule TAKE 1 CAPSULE BY MOUTH EVERY 8 HOURS AS NEEDED FOR SPASM  . gabapentin (NEURONTIN) 300 MG capsule TAKE 1 CAPSULE BY MOUTH THREE TIMES DAILY FOR  CHRONIC  PAIN.  [name] ipratropium-albuterol (DUONEB) 0.5-2.5 (3) MG/3ML SOLN USE ONE  VIAL IN NEBULIZER 4 TIMES DAILY (Patient taking differently: Take 3 mLs by nebulization every 6 (six) hours as needed (wheezing). USE ONE VIAL IN NEBULIZER 4 TIMES DAILY as needed)  . ketoconazole (NIZORAL) 2 % cream APPLY 1 APPLICATION TOPICALLY TWO TIMES DAILY (Patient taking differently: APPLY 1 APPLICATION TOPICALLY TWO TIMES DAILY AS NEEDED FOR IRRITATION)  . losartan (COZAAR) 100 MG tablet TAKE ONE TABLET BY MOUTH ONCE DAILY (DISCONTINUE  BENICAR)  . MAGNESIUM PO Take by mouth daily.  . meclizine (ANTIVERT) 25 MG tablet 1/2-1 pill up to 3 times daily for motion sickness/dizziness (Patient taking differently: Take 12.5-25 mg by mouth 3 (three) times daily as needed for dizziness. 1/2-1 pill up to 3 times daily for motion sickness/dizziness)  . methocarbamol (ROBAXIN) 500 MG tablet Take 1 tablet (500 mg total) by mouth every 6 (six) hours as needed for muscle spasms.  . metoprolol tartrate (LOPRESSOR) 50 MG tablet Take 50 mg by mouth at bedtime.  . mometasone-formoterol (DULERA) 100-5 MCG/ACT AERO Inhale 2 puffs into the lungs every 12 (twelve) hours. (Patient taking differently: Inhale 2 puffs into the lungs every 12 (twelve) hours as needed for wheezing or shortness of breath. )  . naloxegol oxalate (MOVANTIK) 25 MG TABS tablet Take 1 tablet (25 mg total) by mouth daily.  [name] neomycin-polymyxin b-dexamethasone (MAXITROL) 3.5-10000-0.1 OINT APPLY TO STYE FOUR TIMES DAILY. (Patient taking differently: Place 1 application into both eyes See admin instructions. APPLY TO STYE FOUR TIMES DAILY AS NEEDED FOR EYE STYE)  . nitroGLYCERIN (NITROSTAT) 0.4 MG SL tablet Place 1 tablet (0.4 mg total) under the tongue every 5 (five) minutes as needed for chest pain.  [name] ondansetron (ZOFRAN-ODT) 8 MG disintegrating tablet DISSOLVE ONE TABLET IN MOUTH EVERY 8 HOURS AS NEEDED FOR NAUSEA AND  VOMITING  . OVER THE COUNTER MEDICATION Take 1,000 mg by mouth 2 (two) times daily. [place] Calcium 1000mg   .  oxyCODONE-acetaminophen (PERCOCET/ROXICET) 5-325 MG tablet Take 1 tablet by mouth every 4 (four) hours as needed for severe pain.  . polyethylene glycol powder (GLYCOLAX/MIRALAX) powder Take 17 g by mouth 2 (two) times daily. (Patient taking differently: Take 17 g by mouth 2 (two) times daily as needed for mild constipation or moderate constipation. )  . pravastatin (PRAVACHOL) 40 MG tablet Take 1 tablet (40 mg total) by mouth daily.  . promethazine (PHENERGAN) 25 MG suppository Place 1 suppository (25 mg total) rectally every 6 (six) hours as needed for vomiting.  . promethazine (PHENERGAN) 25 MG tablet Take 1 tablet (25 mg total) by mouth every 6 (six) hours as needed for nausea or vomiting.  . senna-docusate (SENOKOT-S) 8.6-50 MG tablet Take 2 tablets by mouth 2 (two) times daily.  . silodosin (RAPAFLO) 8 MG CAPS capsule Take 8 mg by mouth daily with breakfast.    No current facility-administered medications on file prior to visit.    Allergies  Allergen Reactions  . Ace Inhibitors Cough    Per Dr Melvyn Novas pulmonology 2013  . Aspartame Diarrhea and  Nausea And Vomiting  . Atorvastatin Other (See Comments)    Muscle aches  . Biaxin [Clarithromycin] Other (See Comments)    PATIENT CAN TOLERATE Z-PAK.  Is written on patient's paper chart.  [name] [Roflumilast] Nausea And Vomiting  . Erythromycin Other (See Comments)    PATIENT CAN TOLERATE Z-PAK.  It is written on patient's paper chart.  . Levofloxacin Nausea And Vomiting   PMHx:   Past Medical History:  Diagnosis Date  . Anxiety    takes Xanax daily as needed  . Asthma    Albuterol daily as needed  . Blood transfusion without reported diagnosis   . CAD (coronary artery disease)    LHC 2003 with 40% pLAD, 30% mLAD, 100% D1 (moderate vessel), 100%  D2 (small vessel), 95% mid small OM2.  Pt had PTCA to D1;  D2 and OM2 small vessels and tx medically;  Last Myoview (2012): anterior infarct seen with scar, no ischemia. EF normal. Pt  managed medically;  Lexiscan Myoview (12/14):  Low risk; ant scar with very mild peri-infarct ischemia; EF 55% with ant AK   . Cataract   . Chronic back pain    HNP, DDD, spinal stenosis, vertebral compression fractures.   . Chronic nausea    takes Zofran daily as needed.  normal gastric emptying study 03/2013  . CKD (chronic kidney disease), stage III ([place]) 09/27/2017  . Constipation    felt to be functional. takes Senokot and Miralax daily.  treated with Linzess in past.   . DIVERTICULOSIS, COLON 08/28/2008   Qualifier: Diagnosis of  By: [name], [name], [name]    . Elevated LFTs 2015   probably med induced DILI, from Augmentin vs Pravastatin  . GERD (gastroesophageal reflux disease)    hx of esophageal stricture   . Hiatal hernia    Paraesophageal hernias. s/p fundoplications in 7425 and 04/2013  . History of bronchitis several of yrs ago  . History of colon polyps 03/2009, 03/2011   adenomatous colon polyps, no high grade dysplasia.   [name] History of vertigo    no meds  . Hyperlipidemia    takes Pravastatin daily  . Hypertension    takes Amlodipine,Metoprolol,and Losartan daily  . Nausea 03/2016  . Osteoporosis   . PONV (postoperative nausea and vomiting)    yrs ago  . Slow urinary stream    takes Rapaflo daily  . TIA (transient ischemic attack) 1995   no residual effects noted   Immunization History  Administered Date(s) Administered  . DT 05/06/2014  . Influenza Split 02/07/2011  . Influenza Whole 02/09/2009, 01/19/2010, 02/13/2012  . Influenza, High Dose Seasonal PF 03/22/2013, 01/15/2014, 02/27/2015, 01/14/2016, 01/18/2017  . Pneumococcal Conjugate-13 05/06/2014  . Pneumococcal Polysaccharide-23 02/09/2011  . Pneumococcal-Unspecified 02/06/2010, 02/09/2012  . Td 05/10/2003   Past Surgical History:  Procedure Laterality Date  . APPENDECTOMY     patient unsure of date  . BACK SURGERY    . BLADDER REPAIR    . CHOLECYSTECTOMY     Patient unsure of date.  . CORONARY  ANGIOPLASTY  11/16/2001   1 stent  . ESOPHAGEAL MANOMETRY N/A 03/25/2013   Procedure: ESOPHAGEAL MANOMETRY (EM);  Surgeon: Inda Castle, MD;  Location: [facility];  Service: Endoscopy;  Laterality: N/A;  . ESOPHAGOGASTRODUODENOSCOPY N/A 03/15/2013   Procedure: ESOPHAGOGASTRODUODENOSCOPY (EGD);  Surgeon: Jerene Bears, MD;  Location[facility] ENDOSCOPY;  Service: Endoscopy;  Laterality: N/A;  . ESOPHAGOGASTRODUODENOSCOPY N/A 12/25/2015   Procedure: ESOPHAGOGASTRODUODENOSCOPY (EGD);  Surgeon: Nelida Meuse III,  MD;  Location: [place] ENDOSCOPY;  Service: Endoscopy;  Laterality: N/A;  . EYE SURGERY  11/2000   bilateral cataracts with lens implant  . FIXATION KYPHOPLASTY LUMBAR SPINE  X 2   "L3-4"  . HEMORRHOID SURGERY    . HIATAL HERNIA REPAIR  06/03/2011   Procedure: LAPAROSCOPIC REPAIR OF HIATAL HERNIA;  Surgeon: Adin Hector, MD;  Location: [facility];  Service: General;  Laterality: N/A;  . HIATAL HERNIA REPAIR N/A 04/24/2013   Procedure: LAPAROSCOPIC  REPAIR RECURRENT PARASOPHAGEAL HIATAL HERNIA WITH FUNDOPLICATION;  Surgeon: Adin Hector, MD;  Location: [facility];  Service: General;  Laterality: N/A;  . INSERTION OF MESH N/A 04/24/2013   Procedure: INSERTION OF MESH;  Surgeon: Adin Hector, MD;  Location: [facility];  Service: General;  Laterality: N/A;  . IR KYPHO THORACIC WITH BONE BIOPSY  09/29/2017  . IR KYPHO THORACIC WITH BONE BIOPSY  11/03/2017  . LAPAROSCOPIC LYSIS OF ADHESIONS N/A 04/24/2013   Procedure: LAPAROSCOPIC LYSIS OF ADHESIONS;  Surgeon: Adin Hector, MD;  Location: [facility];  Service: General;  Laterality: N/A;  . LAPAROSCOPIC NISSEN FUNDOPLICATION  0/26/3785   Procedure: LAPAROSCOPIC NISSEN FUNDOPLICATION;  Surgeon: Adin Hector, MD;  Location: [facility];  Service: General;  Laterality: N/A;  . LUMBAR FUSION  12/07/2015   Right L2-3 facetectomy, posterolateral fusion L2-3 with pedicle screws, rods, local bone graft, Vivigen cancellous chips. Fusion extended to the L1 level with  pedicle screws and rods  . LUMBAR LAMINECTOMY/DECOMPRESSION MICRODISCECTOMY Right 06/26/2012   Procedure: LUMBAR LAMINECTOMY/DECOMPRESSION MICRODISCECTOMY;  Surgeon: Jessy Oto, MD;  Location: [place];  Service: Orthopedics;  Laterality: Right;  RIGHT L4-5 MICRODISCECTOMY  . LUMBAR LAMINECTOMY/DECOMPRESSION MICRODISCECTOMY N/A 05/29/2015   Procedure: RIGHT L2-3 MICRODISCECTOMY;  Surgeon: Jessy Oto, MD;  Location: [place];  Service: Orthopedics;  Laterality: N/A;  . TOTAL ABDOMINAL HYSTERECTOMY  1975   partial   FHx:    Reviewed / unchanged  SHx:    Reviewed / unchanged  Systems Review:  Constitutional: Denies fever, chills, wt changes, headaches, insomnia, fatigue, night sweats, change in appetite. Eyes: Denies redness, blurred vision, diplopia, discharge, itchy, watery eyes.  ENT: Denies discharge, congestion, post nasal drip, epistaxis, sore throat, earache, hearing loss, dental pain, tinnitus, vertigo, sinus pain, snoring.  CV: Denies chest pain, palpitations, irregular heartbeat, syncope, dyspnea, diaphoresis, orthopnea, PND, claudication or edema. Respiratory: denies cough, dyspnea, DOE, pleurisy, hoarseness, laryngitis, wheezing.  Gastrointestinal: Denies dysphagia, odynophagia, heartburn, reflux, water brash, abdominal pain or cramps, nausea, vomiting, bloating, diarrhea, constipation, hematemesis, melena, hematochezia  or hemorrhoids. Genitourinary: Denies dysuria, frequency, urgency, nocturia, hesitancy, discharge, hematuria or flank pain. Musculoskeletal: Denies arthralgias, myalgias, stiffness, jt. swelling, pain, limping or strain/sprain.  Skin: Denies pruritus, rash, hives, warts, acne, eczema or change in skin lesion(s). Neuro: No weakness, tremor, incoordination, spasms, paresthesia or pain. Psychiatric: Denies confusion, memory loss or sensory loss. Endo: Denies change in weight, skin or hair change.  Heme/Lymph: No excessive bleeding, bruising or enlarged lymph  nodes.  Physical Exam  BP 140/80   Pulse (!) 102   Temp (!) 97.2 F (36.2 C)   Resp 14   Ht 5' 2.5" (1.588 m)   Wt 160 lb 3.2 oz (72.7 kg)   SpO2 96%   BMI 28.83 kg/m   Appears over  nourished   and in no distress.  Eyes: PERRLA, EOMs, conjunctiva no swelling or erythema. Sinuses: No frontal/maxillary tenderness ENT/Mouth: EAC's clear, TM's nl w/o erythema, bulging. Nares clear w/o erythema, swelling,  exudates. Oropharynx clear without erythema or exudates. Oral hygiene is good. Tongue normal, non obstructing. Hearing intact.  Neck: Supple. Thyroid nl. Car 2+/2+ without bruits, nodes or JVD. Chest: Respirations nl with BS clear & equal w/o rales, rhonchi, wheezing or stridor.  Cor: Heart sounds normal w/ regular rate and rhythm without sig. murmurs, gallops, clicks or rubs. Peripheral pulses normal and equal  without edema.  Abdomen: Soft & bowel sounds normal. Very difficult to evaluate as patient seems to have a super sensitive or exaggerated response to barely stroking her flanks chest wall or abdominal wall by hollering & rising from the exam table. Lymphatics: Unremarkable.  Musculoskeletal: Full ROM all peripheral extremities, joint stability, 5/5 strength and normal gait.  Skin: Warm, dry without exposed rashes, lesions or ecchymosis apparent.  Neuro: Cranial nerves intact, reflexes equal bilaterally. Sensory-motor testing grossly intact. Tendon reflexes grossly intact.  Pysch: Alert & oriented x 3.  Insight and judgement nl & appropriate. No ideations.  Assessment and Plan:  1. Intractable vomiting with nausea with Query Abdominal Pain   - CBC with Differential/Platelet - COMPLETE METABOLIC PANEL WITH GFR - Magnesium  2. Coronary artery disease due to lipid rich plaque  3. Essential hypertension  - Continue medication, monitor blood pressure at home.  - Continue DASH diet. Reminder to go to the ER if any CP,  SOB, nausea, dizziness, severe HA, changes  vision/speech.   - CBC with Differential/Platelet - COMPLETE METABOLIC PANEL WITH GFR - Magnesium  4. Hypokalemia  - COMPLETE METABOLIC PANEL WITH GFR  5. Mild intermittent asthma, hx  6. Chronic pain  7. Prediabetes  - Continue diet, exercise, lifestyle modifications.  - Monitor appropriate labs.  8. Hyperlipidemia, mixed  - Continue diet/meds, exercise,& lifestyle modifications.  - Continue monitor periodic cholesterol/liver & renal functions   - Continue supplementation.   9. CKD (chronic kidney disease) stage 3, GFR 30-59 ml/min (HCC)  - COMPLETE METABOLIC PANEL WITH GFR  10. Medication side effects  - CBC with Differential/Platelet - COMPLETE METABOLIC PANEL WITH GFR - Magnesium     Discussed  regular exercise, BP monitoring, weight control to achieve/maintain BMI less than 25 and discussed meds and SE's. Recommended labs to assess and monitor clinical status with further disposition pending results of labs. Over 45 minutes of exam, counseling, chart review was performed.

## 2017-11-24 LAB — CBC WITH DIFFERENTIAL/PLATELET
BASOS PCT: 0.8 %
Basophils Absolute: 60 cells/uL (ref 0–200)
Eosinophils Absolute: 128 cells/uL (ref 15–500)
Eosinophils Relative: 1.7 %
HCT: 45.7 % — ABNORMAL HIGH (ref 35.0–45.0)
Hemoglobin: 15.6 g/dL — ABNORMAL HIGH (ref 11.7–15.5)
LYMPHS ABS: 1898 {cells}/uL (ref 850–3900)
MCH: 31.7 pg (ref 27.0–33.0)
MCHC: 34.1 g/dL (ref 32.0–36.0)
MCV: 92.9 fL (ref 80.0–100.0)
MPV: 11.2 fL (ref 7.5–12.5)
Monocytes Relative: 11 %
NEUTROS ABS: 4590 {cells}/uL (ref 1500–7800)
Neutrophils Relative %: 61.2 %
PLATELETS: 340 10*3/uL (ref 140–400)
RBC: 4.92 10*6/uL (ref 3.80–5.10)
RDW: 12.2 % (ref 11.0–15.0)
TOTAL LYMPHOCYTE: 25.3 %
WBC: 7.5 10*3/uL (ref 3.8–10.8)
WBCMIX: 825 {cells}/uL (ref 200–950)

## 2017-11-24 LAB — COMPLETE METABOLIC PANEL WITH GFR
AG Ratio: 1.5 (calc) (ref 1.0–2.5)
ALBUMIN MSPROF: 4.8 g/dL (ref 3.6–5.1)
ALT: 18 U/L (ref 6–29)
AST: 20 U/L (ref 10–35)
Alkaline phosphatase (APISO): 104 U/L (ref 33–130)
BUN: 16 mg/dL (ref 7–25)
CALCIUM: 10.2 mg/dL (ref 8.6–10.4)
CO2: 24 mmol/L (ref 20–32)
Chloride: 103 mmol/L (ref 98–110)
Creat: 0.85 mg/dL (ref 0.60–0.88)
GFR, EST AFRICAN AMERICAN: 75 mL/min/{1.73_m2} (ref >=60)
GFR, EST NON AFRICAN AMERICAN: 65 mL/min/{1.73_m2} (ref >=60)
GLOBULIN: 3.2 g/dL (ref 1.9–3.7)
GLUCOSE: 112 mg/dL — AB (ref 65–99)
Potassium: 4 mmol/L (ref 3.5–5.3)
SODIUM: 138 mmol/L (ref 135–146)
TOTAL PROTEIN: 8 g/dL (ref 6.1–8.1)
Total Bilirubin: 0.5 mg/dL (ref 0.2–1.2)

## 2017-11-24 LAB — MAGNESIUM: Magnesium: 1.9 mg/dL (ref 1.5–2.5)

## 2017-11-28 ENCOUNTER — Other Ambulatory Visit: Payer: Self-pay | Admitting: Internal Medicine

## 2017-11-28 ENCOUNTER — Ambulatory Visit (INDEPENDENT_AMBULATORY_CARE_PROVIDER_SITE_OTHER): Payer: PPO | Admitting: Specialist

## 2017-11-28 ENCOUNTER — Telehealth: Payer: Self-pay | Admitting: Rheumatology

## 2017-11-28 ENCOUNTER — Other Ambulatory Visit: Payer: Self-pay | Admitting: Rheumatology

## 2017-11-28 ENCOUNTER — Encounter (INDEPENDENT_AMBULATORY_CARE_PROVIDER_SITE_OTHER): Payer: Self-pay | Admitting: Specialist

## 2017-11-28 VITALS — BP 157/86 | Ht 63.0 in | Wt 161.0 lb

## 2017-11-28 DIAGNOSIS — M8000XD Age-related osteoporosis with current pathological fracture, unspecified site, subsequent encounter for fracture with routine healing: Secondary | ICD-10-CM | POA: Diagnosis not present

## 2017-11-28 DIAGNOSIS — R450 Nervousness: Secondary | ICD-10-CM

## 2017-11-28 DIAGNOSIS — M8448XD Pathological fracture, other site, subsequent encounter for fracture with routine healing: Secondary | ICD-10-CM

## 2017-11-28 NOTE — Telephone Encounter (Signed)
Per patient she has decided not to switch to new medication she, and doctor discussed for osteoarthritis. Just too many risks involved. Patient wants to know if she should return the kit you gave her. Also, would like a refill on medication she has been taking (generic Fosomax?) sent to [facility] at [facility]. Please call to advise.

## 2017-11-28 NOTE — Progress Notes (Signed)
Office Visit Note   Patient: [name] [name]           Date of Birth: May 29, 1935           MRN: [id number] Visit Date: 11/28/2017              Requested by: [name], [address], [place] [id number] PCP: [name], MD   Assessment & Plan: Visit Diagnoses:  1. Pathologic thoracic fracture, with routine healing, subsequent encounter   2. Age-related osteoporosis with current pathological fracture with routine healing, subsequent encounter     Plan: Avoid frequent bending and stooping  No lifting greater than 10 lbs. May use ice or moist heat for pain. Weight loss is of benefit. Handicap license is approved. Use a brace for half days. Take calcium and vitamin D. Take bone building agents, IV prolia or oral fosamax. Follow-Up Instructions: Return in about 1 month (around 12/26/2017) for overbook first appointment spot..   Orders:  No orders of the defined types were placed in this encounter.  No orders of the defined types were placed in this encounter.     Procedures: No procedures performed   Clinical Data: Findings:  3 phase total bone scan reviewed with [name] [name] and her husband and daughter. There is no new compression fracture noted, there is an area of left sided uptake probably related the area Of trephine entry into the pedicle for kyphoplasty/vertebroplasty for the compression fracture there. No rib fractures noted. The areas of the lumbar fusion appear relatively less radioactive No lower extremity, sacral or pelvis uptake noted. There is uptake left shoulder and in the area of the right foot and ankle and both wrist consistent with DJD.     Subjective: Chief Complaint  Patient presents with  . Lower Back - Follow-up    Whole Body Bone Scan Review    82 year old female with osteoporosis and history of multiple 4 of 5 lumbar compression fractures, her most recent kyphoplasty was in mid June at the time of the  recent T11 fracture. Due to severe persistent pain a 3 phase total body bone scan was performed. She reports that she still hurts especially over the right lower posterolateral ribs. The pain is better compared with 3 weeks ago. No leg pain, numbness or weakness. No new bladder dysfunction or bowel dysfunction. She did buy the Movantik for problems of  Constipation. She has not started the medication but is concerned about the cost. She has been taking narcotics for some time and constipation is unfortunately likely related.  She is on Miralax and this is working to make her more regular.    Review of Systems  Constitutional: Positive for activity change, appetite change and unexpected weight change.  HENT: Negative.   Eyes: Negative.   Respiratory: Negative.   Cardiovascular: Negative.   Gastrointestinal: Negative.   Endocrine: Negative.   Genitourinary: Negative.   Musculoskeletal: Negative.   Skin: Negative.   Allergic/Immunologic: Negative.   Neurological: Negative.   Hematological: Negative.   Psychiatric/Behavioral: Negative.      Objective: Vital Signs: BP (!) 157/86 (BP Location: Left Arm, Patient Position: Sitting)   Ht 5\' 3"  (1.6 m)   Wt 161 lb (73 kg)   BMI 28.52 kg/m   Physical Exam  Constitutional: She is oriented to person, place, and time. She appears well-developed and well-nourished.  HENT:  Head: Normocephalic and atraumatic.  Eyes: Pupils are equal, round, and reactive to light.  EOM are normal.  Neck: Normal range of motion. Neck supple.  Pulmonary/Chest: Effort normal and breath sounds normal.  Abdominal: Soft. Bowel sounds are normal.  Neurological: She is alert and oriented to person, place, and time.  Skin: Skin is warm and dry.  Psychiatric: She has a normal mood and affect. Her behavior is normal. Judgment and thought content normal.    Back Exam   Tenderness  The patient is experiencing tenderness in the lumbar.  Range of Motion  Extension:  abnormal       Specialty Comments:  No specialty comments available.  Imaging: No results found.   PMFS History: Patient Active Problem List   Diagnosis Date Noted  . Lumbar radiculopathy, acute 12/07/2015    Priority: High    Class: Chronic  . Herniated lumbar intervertebral disc 06/25/2012    Priority: High    Class: Acute  . Intractable nausea and vomiting 11/08/2017  . CKD (chronic kidney disease), stage III ([place]) 09/27/2017  . Low back pain 09/26/2017  . T12 compression fracture ([place]) 09/26/2017  . Chronic bronchitis ([place]) 08/09/2017  . Atherosclerosis of aorta ([place]) 04/05/2017  . Costochondritis 11/14/2016  . Anxiety state 06/08/2016  . History of lumbar spinal fusion 04/13/2016  . Spondylolisthesis, lumbar region 04/13/2016  . Leukocytosis 03/28/2016  . Dysphagia   . Spinal stenosis, lumbar region, with neurogenic claudication 05/29/2015  . DDD (degenerative disc disease), lumbar 04/21/2015  . Mixed hyperlipidemia 11/24/2014  . GERD  11/24/2014  . Depression, major, in partial remission ([place]) 09/26/2014  . Chronic pain syndrome 09/26/2014  . Coronary artery disease due to lipid rich plaque   . Osteoporosis 02/10/2014  . Prediabetes 08/14/2013  . Vitamin D deficiency 08/14/2013  . Medication management 08/14/2013  . Constipation, chronic 04/03/2013  . MGUS (monoclonal gammopathy of unknown significance) 03/05/2013  . Vertebral compression fracture ([place]) 06/23/2012  . ESOPHAGEAL STRICTURE 08/28/2008  . Mild intermittent asthma without complication 78/58/8502  . Incarcerated recurrent paraesophageal hiatal hernia s/p lap redo repair [id number] 01/31/2008  . Essential hypertension 07/25/2007   Past Medical History:  Diagnosis Date  . Anxiety    takes Xanax daily as needed  . Asthma    Albuterol daily as needed  . Blood transfusion without reported diagnosis   . CAD (coronary artery disease)    LHC 2003 with 40% pLAD, 30% mLAD, 100% D1 (moderate vessel),  100%  D2 (small vessel), 95% mid small OM2.  Pt had PTCA to D1;  D2 and OM2 small vessels and tx medically;  Last Myoview (2012): anterior infarct seen with scar, no ischemia. EF normal. Pt managed medically;  Lexiscan Myoview (12/14):  Low risk; ant scar with very mild peri-infarct ischemia; EF 55% with ant AK   . Cataract   . Chronic back pain    HNP, DDD, spinal stenosis, vertebral compression fractures.   . Chronic nausea    takes Zofran daily as needed.  normal gastric emptying study 03/2013  . CKD (chronic kidney disease), stage III ([place]) 09/27/2017  . Constipation    felt to be functional. takes Senokot and Miralax daily.  treated with Linzess in past.   . DIVERTICULOSIS, COLON 08/28/2008   Qualifier: Diagnosis of  By: [name], [name], [name]    . Elevated LFTs 2015   probably med induced DILI, from Augmentin vs Pravastatin  . GERD (gastroesophageal reflux disease)    hx of esophageal stricture   . Hiatal hernia    Paraesophageal hernias. s/p fundoplications in 8676 and 04/2013  .  History of bronchitis several of yrs ago  . History of colon polyps 03/2009, 03/2011   adenomatous colon polyps, no high grade dysplasia.   [name] History of vertigo    no meds  . Hyperlipidemia    takes Pravastatin daily  . Hypertension    takes Amlodipine,Metoprolol,and Losartan daily  . Nausea 03/2016  . Osteoporosis   . PONV (postoperative nausea and vomiting)    yrs ago  . Slow urinary stream    takes Rapaflo daily  . TIA (transient ischemic attack) 1995   no residual effects noted    Family History  Problem Relation Age of Onset  . Colon cancer Mother 84  . Heart disease Father 48       heart attack  . Brain cancer Brother        tumor   . Heart disease Brother   . Heart disease Brother   . Kidney disease Daughter     Past Surgical History:  Procedure Laterality Date  . APPENDECTOMY     patient unsure of date  . BACK SURGERY    . BLADDER REPAIR    . CHOLECYSTECTOMY     Patient unsure  of date.  . CORONARY ANGIOPLASTY  11/16/2001   1 stent  . ESOPHAGEAL MANOMETRY N/A 03/25/2013   Procedure: ESOPHAGEAL MANOMETRY (EM);  Surgeon: Inda Castle, MD;  Location: [facility] ENDOSCOPY;  Service: Endoscopy;  Laterality: N/A;  . ESOPHAGOGASTRODUODENOSCOPY N/A 03/15/2013   Procedure: ESOPHAGOGASTRODUODENOSCOPY (EGD);  Surgeon: Jerene Bears, MD;  Location: [facility] ENDOSCOPY;  Service: Endoscopy;  Laterality: N/A;  . ESOPHAGOGASTRODUODENOSCOPY N/A 12/25/2015   Procedure: ESOPHAGOGASTRODUODENOSCOPY (EGD);  Surgeon: Doran Stabler, MD;  Location: [facility] ENDOSCOPY;  Service: Endoscopy;  Laterality: N/A;  . EYE SURGERY  11/2000   bilateral cataracts with lens implant  . FIXATION KYPHOPLASTY LUMBAR SPINE  X 2   "L3-4"  . HEMORRHOID SURGERY    . HIATAL HERNIA REPAIR  06/03/2011   Procedure: LAPAROSCOPIC REPAIR OF HIATAL HERNIA;  Surgeon: Adin Hector, MD;  Location: [facility] ORS;  Service: General;  Laterality: N/A;  . HIATAL HERNIA REPAIR N/A 04/24/2013   Procedure: LAPAROSCOPIC  REPAIR RECURRENT PARASOPHAGEAL HIATAL HERNIA WITH FUNDOPLICATION;  Surgeon: Adin Hector, MD;  Location: [facility];  Service: General;  Laterality: N/A;  . INSERTION OF MESH N/A 04/24/2013   Procedure: INSERTION OF MESH;  Surgeon: Adin Hector, MD;  Location: [facility];  Service: General;  Laterality: N/A;  . IR KYPHO THORACIC WITH BONE BIOPSY  09/29/2017  . IR KYPHO THORACIC WITH BONE BIOPSY  11/03/2017  . LAPAROSCOPIC LYSIS OF ADHESIONS N/A 04/24/2013   Procedure: LAPAROSCOPIC LYSIS OF ADHESIONS;  Surgeon: Adin Hector, MD;  Location: [facility];  Service: General;  Laterality: N/A;  . LAPAROSCOPIC NISSEN FUNDOPLICATION  09/02/621   Procedure: LAPAROSCOPIC NISSEN FUNDOPLICATION;  Surgeon: Adin Hector, MD;  Location: [facility];  Service: General;  Laterality: N/A;  . LUMBAR FUSION  12/07/2015   Right L2-3 facetectomy, posterolateral fusion L2-3 with pedicle screws, rods, local bone graft, Vivigen cancellous chips. Fusion extended  to the L1 level with pedicle screws and rods  . LUMBAR LAMINECTOMY/DECOMPRESSION MICRODISCECTOMY Right 06/26/2012   Procedure: LUMBAR LAMINECTOMY/DECOMPRESSION MICRODISCECTOMY;  Surgeon: Jessy Oto, MD;  Location: [facility];  Service: Orthopedics;  Laterality: Right;  RIGHT L4-5 MICRODISCECTOMY  . LUMBAR LAMINECTOMY/DECOMPRESSION MICRODISCECTOMY N/A 05/29/2015   Procedure: RIGHT L2-3 MICRODISCECTOMY;  Surgeon: Jessy Oto, MD;  Location: [facility];  Service: Orthopedics;  Laterality:  N/A;  . TOTAL ABDOMINAL HYSTERECTOMY  1975   partial   Social History   Occupational History  . Occupation: Retired    Fish farm manager: RETIRED    Comment: worked at [employer]  . Smoking status: Never Smoker  . Smokeless tobacco: Never Used  Substance and Sexual Activity  . Alcohol use: No  . Drug use: Never  . Sexual activity: Never

## 2017-11-28 NOTE — Telephone Encounter (Signed)
Last Visit: 11/14/17 Next Visit: 05/17/18 Labs: 11/23/17 cbc/cmp wnl  Patient was offered Tymlos and had agreed to take it. Patient has call back and states she has talked it over with her husband and and she does not want to take the Tymlos. Patient would rather continue to take the Fosamax.  Okay to refill Fosamax?

## 2017-11-28 NOTE — Telephone Encounter (Signed)
ok 

## 2017-11-28 NOTE — Patient Instructions (Addendum)
Avoid frequent bending and stooping  No lifting greater than 10 lbs. May use ice or moist heat for pain. Weight loss is of benefit. Handicap license is approved.  Use a brace for half days. Take calcium and vitamin D. Take bone building agents, IV prolia or oral fosamax.

## 2017-11-29 DIAGNOSIS — G894 Chronic pain syndrome: Secondary | ICD-10-CM | POA: Diagnosis not present

## 2017-11-29 DIAGNOSIS — M4726 Other spondylosis with radiculopathy, lumbar region: Secondary | ICD-10-CM | POA: Diagnosis not present

## 2017-11-29 DIAGNOSIS — M961 Postlaminectomy syndrome, not elsewhere classified: Secondary | ICD-10-CM | POA: Diagnosis not present

## 2017-11-29 DIAGNOSIS — Z79891 Long term (current) use of opiate analgesic: Secondary | ICD-10-CM | POA: Diagnosis not present

## 2017-11-29 NOTE — Progress Notes (Signed)
MEDICARE ANNUAL WELLNESS VISIT AND FOLLOW UP  Assessment:   Medicare annual wellness visit  Essential hypertension - continue medications, DASH diet, exercise and monitor at home. Call if greater than 130/80.  - CBC with Differential/Platelet - BASIC METABOLIC PANEL WITH GFR - Hepatic function panel - TSH  Prediabetes Discussed general issues about diabetes pathophysiology and management., Educational material distributed., Suggested low cholesterol diet., Encouraged aerobic exercise., Discussed foot care., Reminded to get yearly retinal exam. - Hemoglobin A1c   Mixed hyperlipidemia -continue medications, check lipids, decrease fatty foods, increase activity.  - Lipid panel  Coronary artery disease due to lipid rich plaque Control blood pressure, cholesterol, glucose, increase exercise.  - Lipid panel - Hemoglobin A1c  Recurrent major depressive disorder, in partial remission (HCC) Remission, negative screen  Atherosclerosis of aorta (HCC) Control blood pressure, cholesterol, glucose, increase exercise.   Incarcerated recurrent paraesophageal hiatal hernia s/p lap redo repair [id number] Improved, continue PPI   Asthma, unspecified asthma severity, uncomplicated controlled  Vertebral compression fracture, sequela Continue fosamax, DEXA due 2 years, continue Vit D and Ca, weight bearing exercises  Osteoporosis Continue fosamax, DEXA due this year, continue Vit D and Ca, weight bearing exercises   Herniated lumbar intervertebral disc Continue pain management  Vitamin D deficiency - Vit D  25 hydroxy (rtn osteoporosis monitoring)  MGUS (monoclonal gammopathy of unknown significance) - CBC with Differential/Platelet  Medication management - Magnesium  Chronic pain syndrome Continue pain management   Anxiety continue medications, stress management techniques discussed, increase water, good sleep hygiene discussed, increase exercise, and increase veggies.     Gastroesophageal reflux disease, esophagitis presence not specified Continue PPI/H2 blocker, diet discussed   ESOPHAGEAL STRICTURE improved   Constipation, chronic Continue miralax  Medicare visit Get MGM, get DEXA, get colonoscopy  Spondylolisthesis, lumbar region RICE, NSAIDS, exercises given, if not better get xray and PT referral or ortho referral.   DDD (degenerative disc disease), lumbar Continue follow up pain management  Spinal stenosis, lumbar region, with neurogenic claudication Continue follow up pain management  History of lumbar spinal fusion Continue follow up pain management    Over 30 minutes of exam, counseling, chart review, and critical decision making was performed  Future Appointments  Date Time Provider [place]  12/19/2017  3:15 PM Richardson Dopp T, PA-C [facility]  12/21/2017 10:30 AM Zehr, Laban Emperor, PA-C [facility]  12/29/2017  9:45 AM Jessy Oto, MD [facility] None  03/02/2018 10:30 AM [name], MD [facility] None  05/17/2018  9:30 AM [name], MD [facility] None  08/28/2018 10:00 AM [name], MD [facility] None  09/10/2018 10:00 AM [name], MD [place] None     Plan:   During the course of the visit the patient was educated and counseled about appropriate screening and preventive services including:    Pneumococcal vaccine   Influenza vaccine  Td vaccine  Prevnar 13  Screening electrocardiogram  Screening mammography  Bone densitometry screening  Colorectal cancer screening  Diabetes screening  Glaucoma screening  Nutrition counseling   Advanced directives: given info/requested copies   Subjective:   [name] [name] is a 82 y.o. female who presents for Medicare Annual Wellness Visit and 3 month follow up on hypertension, prediabetes, hyperlipidemia, vitamin D def. Patient has been followed for HTN, ASCAD/Stents,  HLD, Prediabetes  and Vitamin D Deficiency.  Patient  has a chronic pain syndrome and is followed by Dr Nicholaus Bloom and Dr Louanne Skye.  In 2014 , she had Lumbar Disc  surgery and then in Jan 2017 had lumbar decompression followed later in July 2017 a Lumbar Fusion by Dr Louanne Skye. Also in 2014, she underwent Kyphoplasty for L3/L4 Compression Fx's.  Patient has hx/o Osteoporosis with T-2.8 at last DexaBMD on 07/08/2016 and is on Fosamax, followed by Dr. Cruzita Lederer She also has history of PTCA/stent placed in 2003 and low risk myoview in 2014. She followes with Dr. Melvyn Novas for COPD/asthma. She has history of MGUS with negative BX.   She is followed by Dr. Hardin Negus for chronic pain and prescribed percocets; she was previously taking 6 tabs a day, but is currently tapering down and at 3 tabs daily. She is currently taking gabapentin 300 mg twice daily.  she has a diagnosis of anxiety and is currently on xanax 0.25-0.5 mg TID PRN, reports symptoms are well controlled on current regimen. she reports she last took 2-3 weeks ago, needs sporadically.   BMI is Body mass index is 28.8 kg/m., she has not been working on diet and exercise. Wt Readings from Last 3 Encounters:  11/30/17 160 lb (72.6 kg)  11/28/17 161 lb (73 kg)  11/23/17 160 lb 3.2 oz (72.7 kg)   Her blood pressure has been controlled at home, today their BP is BP: 120/60  She does not workout due to chronic pain.   She is on cholesterol medication and denies myalgias. Her cholesterol is at goal. The cholesterol last visit was:   Lab Results  Component Value Date   CHOL 169 07/25/2017   HDL 83 07/25/2017   LDLCALC 65 07/25/2017   TRIG 131 07/25/2017   CHOLHDL 2.0 07/25/2017  She has history of Hiatal hernia and is s/p surgery for this, doing better.   She has been working on diet and exercise for prediabetes (x 2010), and denies polydipsia, polyuria and visual disturbances. Last A1C in the office was:  Lab Results  Component Value Date   HGBA1C 5.7 (H) 07/25/2017   Last GFR  Lab Results  Component  Value Date   GFRNONAA 65 11/23/2017   Patient is on Vitamin D supplement. Lab Results  Component Value Date   VD25OH 48 07/25/2017      Medication Review Current Outpatient Medications on File Prior to Visit  Medication Sig Dispense Refill  . acetaminophen (TYLENOL) 500 MG tablet Take 1,000 mg by mouth every 6 (six) hours as needed for mild pain.    [name] albuterol (PROAIR HFA) 108 (90 Base) MCG/ACT inhaler Inhale 2 puffs into the lungs every 6 (six) hours as needed for wheezing or shortness of breath. 18 g 2  . alendronate (FOSAMAX) 70 MG tablet  TAKE 1 TABLET BY MOUTH ONCE A WEEK. TAKE WITH A FULL GLASS OF WATER ON AN EMPTY STOMACH. 12 tablet 0  . ALPRAZolam (XANAX) 0.5 MG tablet TAKE ONE-HALF TO ONE TABLET BY MOUTH 2 TO 3 TIMES DAILY ONLY IF ANXIETY ATTACK AND PLEASE TRY TO LIMIT TO 5 DAYS A WEEK TO AVOID ADDICTION 90 tablet 0  . amLODipine (NORVASC) 10 MG tablet TAKE 1 TABLET BY MOUTH AT BEDTIME 30 tablet 1  . Cholecalciferol (VITAMIN D3) 5000 units CAPS Take 5,000 Units by mouth 2 (two) times daily.     . diclofenac sodium (VOLTAREN) 1 % GEL APPLY 4 G TOPICALLY FOUR TIMES DAILY (Patient taking differently: APPLY 4 G TOPICALLY FOUR TIMES DAILY AS NEEDED) 5 Tube 3  . dicyclomine (BENTYL) 10 MG capsule TAKE 1 CAPSULE BY MOUTH EVERY 8 HOURS AS NEEDED FOR SPASM 30 capsule  3  . gabapentin (NEURONTIN) 300 MG capsule TAKE 1 CAPSULE BY MOUTH THREE TIMES DAILY FOR  CHRONIC  PAIN. 270 capsule 1  . ipratropium-albuterol (DUONEB) 0.5-2.5 (3) MG/3ML SOLN USE ONE VIAL IN NEBULIZER 4 TIMES DAILY (Patient taking differently: Take 3 mLs by nebulization every 6 (six) hours as needed (wheezing). USE ONE VIAL IN NEBULIZER 4 TIMES DAILY as needed) 360 mL 99  . ketoconazole (NIZORAL) 2 % cream APPLY 1 APPLICATION TOPICALLY TWO TIMES DAILY (Patient taking differently: APPLY 1 APPLICATION TOPICALLY TWO TIMES DAILY AS NEEDED FOR IRRITATION) 30 g 1  . losartan (COZAAR) 100 MG tablet TAKE ONE TABLET BY MOUTH ONCE DAILY  (DISCONTINUE  BENICAR) 90 tablet 1  . MAGNESIUM PO Take by mouth daily.    . meclizine (ANTIVERT) 25 MG tablet 1/2-1 pill up to 3 times daily for motion sickness/dizziness (Patient taking differently: Take 12.5-25 mg by mouth 3 (three) times daily as needed for dizziness. 1/2-1 pill up to 3 times daily for motion sickness/dizziness) 60 tablet 2  . methocarbamol (ROBAXIN) 500 MG tablet Take 1 tablet (500 mg total) by mouth every 6 (six) hours as needed for muscle spasms. 28 tablet 0  . metoprolol tartrate (LOPRESSOR) 50 MG tablet Take 50 mg by mouth at bedtime.    . mometasone-formoterol (DULERA) 100-5 MCG/ACT AERO Inhale 2 puffs into the lungs every 12 (twelve) hours. (Patient taking differently: Inhale 2 puffs into the lungs every 12 (twelve) hours as needed for wheezing or shortness of breath. ) 1 Inhaler 0  . naloxegol oxalate (MOVANTIK) 25 MG TABS tablet Take 1 tablet (25 mg total) by mouth daily. 30 tablet 2  . neomycin-polymyxin b-dexamethasone (MAXITROL) 3.5-10000-0.1 OINT APPLY TO STYE FOUR TIMES DAILY. (Patient taking differently: Place 1 application into both eyes See admin instructions. APPLY TO STYE FOUR TIMES DAILY AS NEEDED FOR EYE STYE) 4 Tube 3  . nitroGLYCERIN (NITROSTAT) 0.4 MG SL tablet Place 1 tablet (0.4 mg total) under the tongue every 5 (five) minutes as needed for chest pain. 90 tablet 3  . ondansetron (ZOFRAN-ODT) 8 MG disintegrating tablet DISSOLVE ONE TABLET IN MOUTH EVERY 8 HOURS AS NEEDED FOR NAUSEA AND  VOMITING 30 tablet 5  . OVER THE COUNTER MEDICATION Take 1,000 mg by mouth 2 (two) times daily. [place] Calcium 1000mg     . oxyCODONE-acetaminophen (PERCOCET/ROXICET) 5-325 MG tablet Take 1 tablet by mouth every 4 (four) hours as needed for severe pain. 15 tablet 0  . polyethylene glycol powder (GLYCOLAX/MIRALAX) powder Take 17 g by mouth 2 (two) times daily. (Patient taking differently: Take 17 g by mouth 2 (two) times daily as needed for mild constipation or moderate  constipation. ) 527 g 0  . pravastatin (PRAVACHOL) 40 MG tablet Take 1 tablet (40 mg total) by mouth daily. 90 tablet 1  . promethazine (PHENERGAN) 25 MG suppository Place 1 suppository (25 mg total) rectally every 6 (six) hours as needed for vomiting. 12 each 0  . promethazine (PHENERGAN) 25 MG tablet Take 1 tablet (25 mg total) by mouth every 6 (six) hours as needed for nausea or vomiting. 50 tablet 1  . senna-docusate (SENOKOT-S) 8.6-50 MG tablet Take 2 tablets by mouth 2 (two) times daily. 60 tablet 1  . silodosin (RAPAFLO) 8 MG CAPS capsule Take 8 mg by mouth daily with breakfast.     . Abaloparatide (TYMLOS) 3120 MCG/1.56ML SOPN Inject 80 mcg into the skin daily. (Patient not taking: Reported on 11/30/2017) 1 pen 2   No current facility-administered  medications on file prior to visit.     Current Problems (verified) Patient Active Problem List   Diagnosis Date Noted  . CKD (chronic kidney disease), stage III ([place]) 09/27/2017  . T12 compression fracture ([place]) 09/26/2017  . Chronic bronchitis ([place]) 08/09/2017  . Atherosclerosis of aorta ([place]) 04/05/2017  . Costochondritis 11/14/2016  . Anxiety state 06/08/2016  . Spondylolisthesis, lumbar region 04/13/2016  . Dysphagia   . Lumbar radiculopathy, acute 12/07/2015    Class: Chronic  . Spinal stenosis, lumbar region, with neurogenic claudication 05/29/2015  . DDD (degenerative disc disease), lumbar 04/21/2015  . Mixed hyperlipidemia 11/24/2014  . GERD  11/24/2014  . Depression, major, in partial remission ([place]) 09/26/2014  . Chronic pain syndrome 09/26/2014  . Coronary artery disease due to lipid rich plaque   . Osteoporosis 02/10/2014  . Prediabetes 08/14/2013  . Vitamin D deficiency 08/14/2013  . Medication management 08/14/2013  . Constipation, chronic 04/03/2013  . MGUS (monoclonal gammopathy of unknown significance) 03/05/2013  . Herniated lumbar intervertebral disc 06/25/2012    Class: Acute  . Vertebral compression  fracture ([place]) 06/23/2012  . ESOPHAGEAL STRICTURE 08/28/2008  . Mild intermittent asthma without complication 63/78/5885  . Incarcerated recurrent paraesophageal hiatal hernia s/p lap redo repair [id number] 01/31/2008  . Essential hypertension 07/25/2007    Screening Tests Immunization History  Administered Date(s) Administered  . DT 05/06/2014  . Influenza Split 02/07/2011  . Influenza Whole 02/09/2009, 01/19/2010, 02/13/2012  . Influenza, High Dose Seasonal PF 03/22/2013, 01/15/2014, 02/27/2015, 01/14/2016, 01/18/2017  . Pneumococcal Conjugate-13 05/06/2014  . Pneumococcal Polysaccharide-23 02/09/2011  . Pneumococcal-Unspecified 02/06/2010, 02/09/2012  . Td 05/10/2003    Preventative care: Last colonoscopy: 05/2017 EGD 12/2015 Last mammogram: 11/2016 DUE Last pap smear/pelvic exam: remote,declines   DEXA 07/2016 CT head 01/2014 Ct AB 11/2013 CT chest 11/2013 Myocardial perfusion 09/2014, previous MI, low risk study Echo 08/2014, EF 55-60%  Prior vaccinations: TD or Tdap: 04/2014  Influenza: 2018  Pneumococcal: 2012 Prevnar13:  2015 Shingles/Zostavax: declines due to cost  Names of Other Physician/Practitioners you currently use: 1. [facility]- here for primary care 2. Dr. Katy Fitch eye doctor, last visit 2019 3. No dentist, dentures.    Patient Care Team: [name], MD as PCP - General (Internal Medicine) End, Harrell Gave, MD as PCP - Cardiology (Cardiology) Inda Castle, MD (Inactive) as Consulting Physician (Gastroenterology) Rigoberto Noel, MD as Consulting Physician (Pulmonary Disease) Larey Dresser, MD as Consulting Physician (Cardiology) Michael Boston, MD as Consulting Physician (General Surgery) Jessy Oto, MD as Consulting Physician (Orthopedic Surgery) Philemon Kingdom, MD as Consulting Physician (Endocrinology) Curt Bears, MD as Consulting Physician (Oncology)  Allergies Allergies  Allergen  Reactions  . Ace Inhibitors Cough    Per Dr Melvyn Novas pulmonology 2013  . Aspartame Diarrhea and Nausea And Vomiting  . Atorvastatin Other (See Comments)    Muscle aches  . Biaxin [Clarithromycin] Other (See Comments)    PATIENT CAN TOLERATE Z-PAK.  Is written on patient's paper chart.  [name] [Roflumilast] Nausea And Vomiting  . Erythromycin Other (See Comments)    PATIENT CAN TOLERATE Z-PAK.  It is written on patient's paper chart.  . Levofloxacin Nausea And Vomiting    SURGICAL HISTORY She  has a past surgical history that includes Back surgery; Bladder repair; Hemorrhoid surgery; Cholecystectomy; Appendectomy; Total abdominal hysterectomy (1975); Eye surgery (11/2000); Hiatal hernia repair (06/03/2011); Laparoscopic Nissen fundoplication (8/78/6767); Lumbar laminectomy/decompression microdiscectomy (Right, 06/26/2012); Esophagogastroduodenoscopy (N/A, 03/15/2013); Esophageal manometry (N/A, 03/25/2013); Hiatal hernia repair (  N/A, 04/24/2013); Insertion of mesh (N/A, 04/24/2013); Laparoscopic lysis of adhesions (N/A, 04/24/2013); Fixation kyphoplasty lumbar spine (X 2); Coronary angioplasty (11/16/2001); Lumbar laminectomy/decompression microdiscectomy (N/A, 05/29/2015); Lumbar fusion (12/07/2015); Esophagogastroduodenoscopy (N/A, 12/25/2015); IR KYPHO THORACIC WITH BONE BIOPSY (09/29/2017); and IR KYPHO THORACIC WITH BONE BIOPSY (11/03/2017). FAMILY HISTORY Her family history includes Brain cancer in her brother; Colon cancer (age of onset: 28) in her mother; Heart disease in her brother and brother; Heart disease (age of onset: 2) in her father; Kidney disease in her daughter. SOCIAL HISTORY She  reports that she has never smoked. She has never used smokeless tobacco. She reports that she does not drink alcohol or use drugs.  MEDICARE WELLNESS OBJECTIVES: Physical activity: Current Exercise Habits: The patient does not participate in regular exercise at present, Exercise limited by: orthopedic  condition(s) Cardiac risk factors: Cardiac Risk Factors include: dyslipidemia;hypertension;advanced age (>56men, >61 women);sedentary lifestyle Depression/mood screen:   Depression screen [facility] 2/9 11/30/2017  Decreased Interest 0  Down, Depressed, Hopeless 0  PHQ - 2 Score 0  Altered sleeping -  Tired, decreased energy -  Change in appetite -  Feeling bad or failure about yourself  -  Trouble concentrating -  Moving slowly or fidgety/restless -  Suicidal thoughts -  PHQ-9 Score -  Difficult doing work/chores -  Some recent data might be hidden    ADLs:  In your present state of health, do you have any difficulty performing the following activities: 11/30/2017 11/25/2017  Hearing? N N  Vision? N N  Difficulty concentrating or making decisions? N N  Walking or climbing stairs? N N  Dressing or bathing? N N  Doing errands, shopping? N N  Comment can drive, but typically driven by husband -  Some recent data might be hidden     Cognitive Testing  Alert? Yes  Normal Appearance?Yes  Oriented to person? Yes  Place? Yes   Time? Yes  Recall of three objects?  Yes  Can perform simple calculations? Yes  Displays appropriate judgment?Yes  Can read the correct time from a watch face?Yes  EOL planning: Does Patient Have a Medical Advance Directive?: Yes Type of Advance Directive: Healthcare Power of Attorney, Living will Does patient want to make changes to medical advance directive?: No - Patient declined Copy of [place] in Chart?: Yes   Objective:   Today's Vitals   11/30/17 1054  BP: 120/60  Pulse: 71  Temp: (!) 97.3 F (36.3 C)  SpO2: 96%  Weight: 160 lb (72.6 kg)  Height: 5' 2.5" (1.588 m)  PainSc: 8   PainLoc: Hip   Body mass index is 28.8 kg/m.  General appearance: alert, no distress, WD/WN,  female HEENT: normocephalic, sclerae anicteric, TMs pearly, nares patent, no discharge or erythema, pharynx normal Oral cavity: MMM, no lesions Neck:  supple, no lymphadenopathy, no thyromegaly, no masses Heart: RRR, normal S1, S2, no murmurs Lungs: CTA bilaterally, no wheezes, rhonchi, or rales Abdomen: +bs, soft, non tender, non distended, no masses, no hepatomegaly, no splenomegaly Musculoskeletal: nontender, no swelling, no obvious deformity Extremities: no edema, no cyanosis, no clubbing Pulses: 2+ symmetric, upper and lower extremities, normal cap refill Neurological: alert, oriented x 3, CN2-12 intact, strength 4/5 upper extremities and lower extremities, DTRs 2+ throughout, no cerebellar signs, gait antaglic with cane Psychiatric: normal affect, behavior normal, pleasant  Breast: defer Gyn: defer Rectal: defer   Medicare Attestation I have personally reviewed: The patient's medical and social history Their use of alcohol, tobacco or  illicit drugs Their current medications and supplements The patient's functional ability including ADLs,fall risks, home safety risks, cognitive, and hearing and visual impairment Diet and physical activities Evidence for depression or mood disorders  The patient's weight, height, BMI, and visual acuity have been recorded in the chart.  I have made referrals, counseling, and provided education to the patient based on review of the above and I have provided the patient with a written personalized care plan for preventive services.     Izora Ribas, NP   11/30/2017

## 2017-11-29 NOTE — Telephone Encounter (Signed)
Patient' husband advised him that patient's prescription has been sent to the pharmacy and we are unable to take the sample that was provided back.

## 2017-11-30 ENCOUNTER — Ambulatory Visit: Payer: PPO | Admitting: Gastroenterology

## 2017-11-30 ENCOUNTER — Ambulatory Visit (INDEPENDENT_AMBULATORY_CARE_PROVIDER_SITE_OTHER): Payer: PPO | Admitting: Adult Health

## 2017-11-30 ENCOUNTER — Encounter: Payer: Self-pay | Admitting: Adult Health

## 2017-11-30 VITALS — BP 120/60 | HR 71 | Temp 97.3°F | Ht 62.5 in | Wt 160.0 lb

## 2017-11-30 DIAGNOSIS — K222 Esophageal obstruction: Secondary | ICD-10-CM | POA: Diagnosis not present

## 2017-11-30 DIAGNOSIS — G894 Chronic pain syndrome: Secondary | ICD-10-CM

## 2017-11-30 DIAGNOSIS — K219 Gastro-esophageal reflux disease without esophagitis: Secondary | ICD-10-CM | POA: Diagnosis not present

## 2017-11-30 DIAGNOSIS — Z0001 Encounter for general adult medical examination with abnormal findings: Secondary | ICD-10-CM | POA: Diagnosis not present

## 2017-11-30 DIAGNOSIS — M5126 Other intervertebral disc displacement, lumbar region: Secondary | ICD-10-CM

## 2017-11-30 DIAGNOSIS — D472 Monoclonal gammopathy: Secondary | ICD-10-CM

## 2017-11-30 DIAGNOSIS — J42 Unspecified chronic bronchitis: Secondary | ICD-10-CM | POA: Diagnosis not present

## 2017-11-30 DIAGNOSIS — F411 Generalized anxiety disorder: Secondary | ICD-10-CM

## 2017-11-30 DIAGNOSIS — I700 Atherosclerosis of aorta: Secondary | ICD-10-CM

## 2017-11-30 DIAGNOSIS — E782 Mixed hyperlipidemia: Secondary | ICD-10-CM | POA: Diagnosis not present

## 2017-11-30 DIAGNOSIS — M5136 Other intervertebral disc degeneration, lumbar region: Secondary | ICD-10-CM

## 2017-11-30 DIAGNOSIS — M4316 Spondylolisthesis, lumbar region: Secondary | ICD-10-CM

## 2017-11-30 DIAGNOSIS — E559 Vitamin D deficiency, unspecified: Secondary | ICD-10-CM

## 2017-11-30 DIAGNOSIS — Z0000 Encounter for general adult medical examination without abnormal findings: Secondary | ICD-10-CM

## 2017-11-30 DIAGNOSIS — J4520 Mild intermittent asthma, uncomplicated: Secondary | ICD-10-CM | POA: Diagnosis not present

## 2017-11-30 DIAGNOSIS — K5909 Other constipation: Secondary | ICD-10-CM

## 2017-11-30 DIAGNOSIS — R7303 Prediabetes: Secondary | ICD-10-CM | POA: Diagnosis not present

## 2017-11-30 DIAGNOSIS — M8000XD Age-related osteoporosis with current pathological fracture, unspecified site, subsequent encounter for fracture with routine healing: Secondary | ICD-10-CM

## 2017-11-30 DIAGNOSIS — N183 Chronic kidney disease, stage 3 unspecified: Secondary | ICD-10-CM

## 2017-11-30 DIAGNOSIS — I2583 Coronary atherosclerosis due to lipid rich plaque: Secondary | ICD-10-CM

## 2017-11-30 DIAGNOSIS — Z79899 Other long term (current) drug therapy: Secondary | ICD-10-CM

## 2017-11-30 DIAGNOSIS — I2510 Atherosclerotic heart disease of native coronary artery without angina pectoris: Secondary | ICD-10-CM | POA: Diagnosis not present

## 2017-11-30 DIAGNOSIS — M51369 Other intervertebral disc degeneration, lumbar region without mention of lumbar back pain or lower extremity pain: Secondary | ICD-10-CM

## 2017-11-30 DIAGNOSIS — F3341 Major depressive disorder, recurrent, in partial remission: Secondary | ICD-10-CM

## 2017-11-30 DIAGNOSIS — K440 Diaphragmatic hernia with obstruction, without gangrene: Secondary | ICD-10-CM

## 2017-11-30 DIAGNOSIS — I10 Essential (primary) hypertension: Secondary | ICD-10-CM | POA: Diagnosis not present

## 2017-11-30 DIAGNOSIS — M4850XA Collapsed vertebra, not elsewhere classified, site unspecified, initial encounter for fracture: Secondary | ICD-10-CM

## 2017-11-30 NOTE — Addendum Note (Signed)
Addended by: Izora Ribas on: 11/30/2017 11:34 AM   Modules accepted: Level of Service

## 2017-11-30 NOTE — Patient Instructions (Addendum)
[facility]  7 a.m.-6:30 p.m., Monday 7 a.m.-5 p.m., Tuesday-Friday Schedule an appointment by calling [phone].  [facility] Schedule an appointment by calling [phone].    Aim for 7+ servings of fruits and vegetables daily  80+ fluid ounces of water or unsweet tea for healthy kidneys  Limit animal fats in diet for cholesterol and heart health - choose grass fed whenever available  Aim for low stress - take time to unwind and care for your mental health  Aim for 150 min of moderate intensity exercise weekly for heart health, and weights twice weekly for bone health  Aim for 7-9 hours of sleep daily    What You Need to Know About Chronic Back Pain Long-term (chronic) back pain is back pain that lasts for 12 weeks or longer. It often affects the lower back and can range from mild to severe. Many people have back pain at some point in their lives. It can feel different to each person. It may feel like a muscle ache or a sharp, stabbing pain. The pain often gets worse over time. It can be difficult to find the cause of chronic back pain. Treating chronic back pain often starts with rest and pain relief, followed by exercises (physical therapy) to strengthen the muscles that support your back. You may have to try different things to see what works best for you. If other treatments do not help, or if your pain is caused by a condition or an injury, you may need surgery. How can back pain affect me? Chronic back pain is uncomfortable and can make it hard to do your usual daily activities. Chronic back pain can:  Cause numbness and tingling.  Come and go.  Get worse when you are sitting, standing, walking, bending, or lifting.  Affect you while you are active, at rest, or both.  Eventually make it hard to move around.  Occur with fever, weight loss, or difficulty urinating.  What are the benefits of treating back pain? Treating chronic  back pain may:  Relieve pain.  Keep your pain from getting worse.  Make it easier for you to do your usual activities.  What are some steps I can take to decrease my back pain?  Take over-the-counter or prescription medicines only as told by your health care provider.  If directed, apply heat to the affected area. Use the heat source that your health care provider recommends, such as a moist heat pack or a heating pad. ? Place a towel between your skin and the heat source. ? Leave the heat on for 20-30 minutes. ? Remove the heat if your skin turns bright red. This is especially important if you are unable to feel pain, heat, or cold. You may have a greater risk of getting burned.  If directed, put ice on the affected area: ? Put ice in a plastic bag. ? Place a towel between your skin and the bag. ? Leave the ice on for 20 minutes, 2-3 times a day.  Get regular exercise as told by your health care provider to improve flexibility and strength.  Do not smoke.  Maintain a healthy weight.  When lifting objects: ? Keep your feet as far apart as your shoulders (shoulder-width apart) or farther apart. ? Tighten the muscles in your abdomen. ? Bend your knees and hips and keep your spine neutral. It is important to lift using the strength of your legs, not your back. Do not lock  your knees straight out. ? Always ask for help to lift heavy or awkward objects. What can happen if my back pain goes untreated? Untreated back pain can:  Get worse over time.  Start to occur more often or at different times, such as when you are resting.  Cause posture problems.  Make it hard to move around (limit mobility).  Where can I get support? Chronic back pain can be a frustrating condition to manage. It may help to talk with other people who are having a similar experience. Consider joining a support group for people dealing with chronic back pain. Ask your health care provider about support  groups in your area. You can also find online and in-person support groups through:  The American Chronic Pain Association: DeluxeOption.si  The U.S. Pain Foundation: uspainfoundation.org/support-groups  Contact a health care provider if:  Your symptoms do not get better or they get worse.  You have severe back pain.  You have chronic back pain and a fever.  You lose weight without trying.  You have difficulty urinating.  You experience numbness or tingling.  You develop new pain after an injury. Summary  Chronic back pain is often treated with rest, pain relief, and physical therapy.  Get regular exercise to improve your strength and flexibility.  Put heat and ice on the affected areas as directed by your health care provider.  Chronic back pain can be challenging to live with. Joining a support group may help you manage your condition. This information is not intended to replace advice given to you by your health care provider. Make sure you discuss any questions you have with your health care provider. Document Released: 05/10/2015 Document Revised: 01/02/2016 Document Reviewed: 01/02/2016 Elsevier Interactive Patient Education  [employer].

## 2017-12-01 LAB — LIPID PANEL
Cholesterol: 137 mg/dL (ref <200)
HDL: 69 mg/dL (ref >50)
LDL CHOLESTEROL (CALC): 47 mg/dL
Non-HDL Cholesterol (Calc): 68 mg/dL (calc) (ref <130)
Total CHOL/HDL Ratio: 2 (calc) (ref <5.0)
Triglycerides: 126 mg/dL (ref <150)

## 2017-12-01 LAB — HEMOGLOBIN A1C
EAG (MMOL/L): 6.6 (calc)
HEMOGLOBIN A1C: 5.8 %{Hb} — AB (ref <5.7)
Mean Plasma Glucose: 120 (calc)

## 2017-12-01 LAB — TSH: TSH: 0.59 m[IU]/L (ref 0.40–4.50)

## 2017-12-19 ENCOUNTER — Ambulatory Visit: Payer: PPO | Admitting: Physician Assistant

## 2017-12-19 ENCOUNTER — Encounter: Payer: Self-pay | Admitting: Physician Assistant

## 2017-12-19 ENCOUNTER — Ambulatory Visit: Payer: PPO | Admitting: Gastroenterology

## 2017-12-19 VITALS — BP 100/60 | HR 86 | Ht 62.5 in | Wt 162.8 lb

## 2017-12-19 DIAGNOSIS — I10 Essential (primary) hypertension: Secondary | ICD-10-CM | POA: Diagnosis not present

## 2017-12-19 DIAGNOSIS — I700 Atherosclerosis of aorta: Secondary | ICD-10-CM | POA: Diagnosis not present

## 2017-12-19 DIAGNOSIS — I2510 Atherosclerotic heart disease of native coronary artery without angina pectoris: Secondary | ICD-10-CM

## 2017-12-19 DIAGNOSIS — E785 Hyperlipidemia, unspecified: Secondary | ICD-10-CM | POA: Diagnosis not present

## 2017-12-19 NOTE — Addendum Note (Signed)
Addended byKathlen Mody, Nicki Reaper T on: 12/19/2017 04:14 PM   Modules accepted: Level of Service

## 2017-12-19 NOTE — Patient Instructions (Signed)
Medication Instructions:  Your physician recommends that you continue on your current medications as directed. Please refer to the Current Medication list given to you today.   Labwork: NONE ORDERED TODAY  Testing/Procedures: NONE ORDERED TODAY  Follow-Up: SCOTT WEAVER, PAC IN 1 YEAR   Any Other Special Instructions Will Be Listed Below (If Applicable).     If you need a refill on your cardiac medications before your next appointment, please call your pharmacy.

## 2017-12-19 NOTE — Progress Notes (Signed)
Cardiology Office Note:    Date:  12/19/2017   ID:  [name] [name], DOB 23-Nov-1935, MRN [id number]  PCP:  [name], MD  Cardiologist:  Nelva Bush, MD   Referring MD: [name], MD   Chief Complaint  Patient presents with  . Coronary Artery Disease    History of Present Illness:    [name] [name] is a 82 y.o. female with coronary artery disease, hypertension, hyperlipidemia, GERD, MGUS, asthma who is being seen today for the evaluation of CAD at the request of [name], MD.   [name] [name] has a history of coronary artery disease and underwent angioplasty of the first diagonal in 2003.  Nuclear stress test in 2016 demonstrated fixed mid apical anterior perfusion defect consistent with old infarct, no ischemia.  Echocardiogram in 2016 demonstrated normal LV function with PASP 35.  She was previously followed by Dr. Aundra Dubin and was last seen in August 2016.  She presents to reestablish with cardiology.  Since last seen, she denies symptoms of angina.  She denies chest discomfort or significant shortness of breath.  She denies orthopnea, paroxysmal nocturnal dyspnea, lower extremity swelling.  She denies syncope.  PAD Screen 12/19/2017  Previous PAD dx? No  Previous surgical procedure? No  Pain with walking? No  Feet/toe relief with dangling? No  Painful, non-healing ulcers? No  Extremities discolored? No    Prior CV studies:   The following studies were reviewed today:  Chest/Abd/Pelvis CTA 10/10/17 IMPRESSION: CT angiogram chest: 1. No thoracic aortic aneurysm or dissection. No evident pulmonary embolus. There is thoracic aortic atherosclerosis. There are foci of coronary artery calcification. 2.  No edema or consolidation.  Slight bibasilar lung atelectasis. 3.  No demonstrable thoracic adenopathy 4. Focal hiatal hernia with evidence of previous Nissen fundoplication procedure. 5.  Prior kyphoplasty at T12 CT angiogram abdomen; CT angiogram pelvis: 1.  Stable areas of atherosclerosis in the aorta and major mesenteric and pelvic arterial vessels without hemodynamically significant obstruction. No aneurysm or dissection in the abdominal aorta or major pelvic/mesenteric arterial vessels. 2. No bowel obstruction. No abscess. Appendix absent. There are colonic diverticula without diverticulitis 3. Gallbladder absent. Prominence of the common bile duct without mass or calculus evident. 4. Prior for Nissen fundoplication procedure with hiatal hernia. Minimal ventral hernia containing only fat. 5. Multilevel postoperative change in kyphoplasty procedures noted in the lumbar region. Multiple wedge compression fractures in the lumbar spine, a finding noted previously. 6.  No renal or ureteral calculus.  No hydronephrosis. 7.  Uterus absent. Aortic Atherosclerosis (ICD10-I70.0).  Echocardiogram 09/05/2014 EF 55-60, normal wall motion, mild BAE, atrial septal lipomatous hypertrophy, PASP 35  Nuclear stress test 09/20/2014  Defect 1: There is a medium defect present in the mid anterior and apical anterior location.  Findings consistent with prior myocardial infarction.  The left ventricular ejection fraction is normal (55-65%).  This is a low risk study.  Nuclear stress test 04/10/2013 Low risk stress nuclear study with a moderate size, severe intensity, partially reversible anterior defect consistent with prior infarct and very mild peri-infarct ischemia; note LV function appears to be worse than calculated EF; suggest echo to better assess. LV Ejection Fraction: 55%  Cardiac catheterization 11/16/2001 LM normal LAD diffuse proximal-mid 40, mid to distal 30; D1 proximal 100; smaller D2 100 LCx irregularities; OM2 mid 95 (small) RCA normal  Past Medical History:  Diagnosis Date  . Anxiety    takes Xanax daily as needed  . Asthma    Albuterol daily  as needed  . Blood transfusion without reported diagnosis   . CAD (coronary artery  disease)    LHC 2003 with 40% pLAD, 30% mLAD, 100% D1 (moderate vessel), 100%  D2 (small vessel), 95% mid small OM2.  Pt had PTCA to D1;  D2 and OM2 small vessels and tx medically;  Last Myoview (2012): anterior infarct seen with scar, no ischemia. EF normal. Med Rx // Myoview (12/14):  Low risk; ant scar w/ peri-infarct ischemia; EF 55% with ant AK // Myoview 5/16: EF 55-65, ant, apical scar, no ischemia   . Cataract   . Chronic back pain    HNP, DDD, spinal stenosis, vertebral compression fractures.   . Chronic nausea    takes Zofran daily as needed.  normal gastric emptying study 03/2013  . CKD (chronic kidney disease), stage III ([place]) 09/27/2017  . Constipation    felt to be functional. takes Senokot and Miralax daily.  treated with Linzess in past.   . DIVERTICULOSIS, COLON 08/28/2008   Qualifier: Diagnosis of  By: [name], [name], [name]    . Elevated LFTs 2015   probably med induced DILI, from Augmentin vs Pravastatin  . GERD (gastroesophageal reflux disease)    hx of esophageal stricture   . Hiatal hernia    Paraesophageal hernias. s/p fundoplications in 3267 and 04/2013  . History of bronchitis several of yrs ago  . History of colon polyps 03/2009, 03/2011   adenomatous colon polyps, no high grade dysplasia.   [name] History of vertigo    no meds  . Hyperlipidemia    takes Pravastatin daily  . Hypertension    takes Amlodipine,Metoprolol,and Losartan daily  . Nausea 03/2016  . Osteoporosis   . PONV (postoperative nausea and vomiting)    yrs ago  . Slow urinary stream    takes Rapaflo daily  . TIA (transient ischemic attack) 1995   no residual effects noted  1. CAD: [place] 2003 with 40% pLAD, 30% mLAD, 100% D1 (moderate vessel), 100% D2 (small vessel), 95% mid small OM2. Pt had PTCA to D1. Last myoview (5/09): EF 66%, anteroseptal/apical infarct with minimal peri-infarct ischemia. No significant change from 11/06. Pt managed medically.  Lexiscan Cardiolite (11/12) with apical anterior  infarction, no ischemia, EF 68%.  Lexiscan Cardiolite (12/14) with EF 55%, apical anterior infarct with mild peri-infarct ischemia.  Echo (11/14) with EF 12-45%, PA systolic pressure 41 mmHg. Echo (4/16) with EF 55-60%, mild biatrial enlargement, PASP 35 mmHg. Lexiscan Cardiolite in 5/16 showed fixed mid-apical anterior perfusion defect consistent with old infarct, no ischemia.   2. Recurrent bronchitis, suspect asthma: PFTs 12/09 FVC 97%, FEV1 101%, ratio 77%, TLC 109%. No significant obstruction or restriction.  Never smoked.  3. HYPERTENSION   4. HYPERLIPIDEMIA   5. ESOPHAGEAL STRICTURE  6. DIVERTICULOSIS 7. HIATAL HERNIA WITH REFLUX: s/p Nissen fundoplication.  She had repair of recurrent paraesophageal hernia in 12/14.  8. IRON DEFICIENCY ANEMIA: EGD, colonoscopy, and capsule endoscopy in fall 2010 unrevealing for source. She then had mesenteric angiogram in 1/11 with no bleeding source found.  ? small bowel AVMs.  9. Low back pain 10.  MGUS: Negative bone marrow biopsy.  11.  History of vertebral compression fracture.   Past Surgical History:  Procedure Laterality Date  . APPENDECTOMY     patient unsure of date  . BACK SURGERY    . BLADDER REPAIR    . CHOLECYSTECTOMY     Patient unsure of date.  . CORONARY ANGIOPLASTY  11/16/2001  1 stent  . ESOPHAGEAL MANOMETRY N/A 03/25/2013   Procedure: ESOPHAGEAL MANOMETRY (EM);  Surgeon: Inda Castle, MD;  Location: [facility];  Service: Endoscopy;  Laterality: N/A;  . ESOPHAGOGASTRODUODENOSCOPY N/A 03/15/2013   Procedure: ESOPHAGOGASTRODUODENOSCOPY (EGD);  Surgeon: Jerene Bears, MD;  Location: [facility] ENDOSCOPY;  Service: Endoscopy;  Laterality: N/A;  . ESOPHAGOGASTRODUODENOSCOPY N/A 12/25/2015   Procedure: ESOPHAGOGASTRODUODENOSCOPY (EGD);  Surgeon: Doran Stabler, MD;  Location[facility] END[facility]COPY;  Service: Endoscopy;  Laterality: N/A;  . EYE SURGERY  11/2000   bilateral cataracts with lens implant  . FIXATION KYPHOPLASTY LUMBAR SPINE  X 2    "L3-4"  . HEMORRHOID SURGERY    . HIATAL HERNIA REPAIR  06/03/2011   Procedure: LAPAROSCOPIC REPAIR OF HIATAL HERNIA;  Surgeon: Adin Hector, MD;  Location: [facility] ORS;  Service: General;  Laterality: N/A;  . HIATAL HERNIA REPAIR N/A 04/24/2013   Procedure: LAPAROSCOPIC  REPAIR RECURRENT PARASOPHAGEAL HIATAL HERNIA WITH FUNDOPLICATION;  Surgeon: Adin Hector, MD;  Location: [facility] OR[facility];  Service: General;  Laterality: N/A;  . INSERTION OF MESH N/A 04/24/2013   Procedure: INSERTION OF MESH;  Surgeon: Adin Hector, MD;  Location: [facility] OR[facility];  Service: General;  Laterality: N/A;  . IR KYPHO THORACIC WITH BONE BIOPSY  09/29/2017  . IR KYPHO THORACIC WITH BONE BIOPSY  11/03/2017  . LAPAROSCOPIC LYSIS OF ADHESIONS N/A 04/24/2013   Procedure: LAPAROSCOPIC LYSIS OF ADHESIONS;  Surgeon: Adin Hector, MD;  Location: [facility];  Service: General;  Laterality: N/A;  . LAPAROSCOPIC NISSEN FUNDOPLICATION  7/51/7001   Procedure: LAPAROSCOPIC NISSEN FUNDOPLICATION;  Surgeon: Adin Hector, MD;  Location: [facility];  Service: General;  Laterality: N/A;  . LUMBAR FUSION  12/07/2015   Right L2-3 facetectomy, posterolateral fusion L2-3 with pedicle screws, rods, local bone graft, Vivigen cancellous chips. Fusion extended to the L1 level with pedicle screws and rods  . LUMBAR LAMINECTOMY/DECOMPRESSION MICRODISCECTOMY Right 06/26/2012   Procedure: LUMBAR LAMINECTOMY/DECOMPRESSION MICRODISCECTOMY;  Surgeon: Jessy Oto, MD;  Location: [facility];  Service: Orthopedics;  Laterality: Right;  RIGHT L4-5 MICRODISCECTOMY  . LUMBAR LAMINECTOMY/DECOMPRESSION MICRODISCECTOMY N/A 05/29/2015   Procedure: RIGHT L2-3 MICRODISCECTOMY;  Surgeon: Jessy Oto, MD;  Location: [facility];  Service: Orthopedics;  Laterality: N/A;  . TOTAL ABDOMINAL HYSTERECTOMY  1975   partial    Current Medications: Current Meds  Medication Sig  . acetaminophen (TYLENOL) 500 MG tablet Take 1,000 mg by mouth every 6 (six) hours as needed for mild pain.  [name]  albuterol (PROAIR HFA) 108 (90 Base) MCG/ACT inhaler Inhale 2 puffs into the lungs every 6 (six) hours as needed for wheezing or shortness of breath.  [name] alendronate (FOSAMAX) 70 MG tablet  TAKE 1 TABLET BY MOUTH ONCE A WEEK. TAKE WITH A FULL GLASS OF WATER ON AN EMPTY STOMACH.  [name] ALPRAZolam (XANAX) 0.5 MG tablet TAKE ONE-HALF TO ONE TABLET BY MOUTH 2 TO 3 TIMES DAILY ONLY IF ANXIETY ATTACK AND PLEASE TRY TO LIMIT TO 5 DAYS A WEEK TO AVOID ADDICTION  . amLODipine (NORVASC) 10 MG tablet TAKE 1 TABLET BY MOUTH AT BEDTIME  . aspirin EC 81 MG tablet Take 81 mg by mouth daily.  . Cholecalciferol (VITAMIN D3) 5000 units CAPS Take 5,000 Units by mouth 2 (two) times daily.   . diclofenac sodium (VOLTAREN) 1 % GEL Apply 2 g topically 4 (four) times daily.  . diclofenac sodium (VOLTAREN) 1 % GEL Apply 2 g topically 4 (four) times daily.  [name] dicyclomine (  BENTYL) 10 MG capsule TAKE 1 CAPSULE BY MOUTH EVERY 8 HOURS AS NEEDED FOR SPASM  . gabapentin (NEURONTIN) 300 MG capsule TAKE 1 CAPSULE BY MOUTH THREE TIMES DAILY FOR  CHRONIC  PAIN.  [name] ipratropium-albuterol (DUONEB) 0.5-2.5 (3) MG/3ML SOLN Take 3 mLs by nebulization every 6 (six) hours as needed.  [name] ipratropium-albuterol (DUONEB) 0.5-2.5 (3) MG/3ML SOLN Take 3 mLs by nebulization every 6 (six) hours as needed.  [name] ketoconazole (NIZORAL) 2 % cream Apply 1 application topically 2 (two) times daily as needed for irritation.  [name] losartan (COZAAR) 100 MG tablet TAKE ONE TABLET BY MOUTH ONCE DAILY (DISCONTINUE  BENICAR)  . MAGNESIUM PO Take by mouth daily.  . meclizine (ANTIVERT) 25 MG tablet 1/2-1 pill up to 3 times daily for motion sickness/dizziness (Patient taking differently: Take 12.5-25 mg by mouth 3 (three) times daily as needed for dizziness. 1/2-1 pill up to 3 times daily for motion sickness/dizziness)  . methocarbamol (ROBAXIN) 500 MG tablet Take 1 tablet (500 mg total) by mouth every 6 (six) hours as needed for muscle spasms.  . metoprolol tartrate  (LOPRESSOR) 50 MG tablet Take 50 mg by mouth at bedtime.  . mometasone-formoterol (DULERA) 100-5 MCG/ACT AERO Inhale 2 puffs into the lungs every 12 (twelve) hours. (Patient taking differently: Inhale 2 puffs into the lungs every 12 (twelve) hours as needed for wheezing or shortness of breath. )  . naloxegol oxalate (MOVANTIK) 25 MG TABS tablet Take 1 tablet (25 mg total) by mouth daily.  . nitroGLYCERIN (NITROSTAT) 0.4 MG SL tablet Place 1 tablet (0.4 mg total) under the tongue every 5 (five) minutes as needed for chest pain.  [name] ondansetron (ZOFRAN-ODT) 8 MG disintegrating tablet DISSOLVE ONE TABLET IN MOUTH EVERY 8 HOURS AS NEEDED FOR NAUSEA AND  VOMITING  . OVER THE COUNTER MEDICATION Take 1,000 mg by mouth 2 (two) times daily. [place] Calcium 1075m  . oxyCODONE-acetaminophen (PERCOCET/ROXICET) 5-325 MG tablet Take 1 tablet by mouth every 4 (four) hours as needed for severe pain.  . polyethylene glycol powder (GLYCOLAX/MIRALAX) powder Take 17 g by mouth 2 (two) times daily. (Patient taking differently: Take 17 g by mouth 2 (two) times daily as needed for mild constipation or moderate constipation. )  . pravastatin (PRAVACHOL) 40 MG tablet Take 1 tablet (40 mg total) by mouth daily.  . promethazine (PHENERGAN) 25 MG tablet Take 1 tablet (25 mg total) by mouth every 6 (six) hours as needed for nausea or vomiting.  . senna-docusate (SENOKOT-S) 8.6-50 MG tablet Take 2 tablets by mouth 2 (two) times daily.  . silodosin (RAPAFLO) 8 MG CAPS capsule Take 8 mg by mouth daily with breakfast.   . tamsulosin (FLOMAX) 0.4 MG CAPS capsule Take 0.4 mg by mouth daily.  . [DISCONTINUED] ipratropium-albuterol (DUONEB) 0.5-2.5 (3) MG/3ML SOLN USE ONE VIAL IN NEBULIZER 4 TIMES DAILY (Patient taking differently: Take 3 mLs by nebulization every 6 (six) hours as needed (wheezing). USE ONE VIAL IN NEBULIZER 4 TIMES DAILY as needed)     Allergies:   Ace inhibitors; Aspartame; Atorvastatin; Biaxin [clarithromycin];  Daliresp [roflumilast]; Erythromycin; and Levofloxacin   Social History   Socioeconomic History  . Marital status: Married    Spouse name: [name]  . Number of children: 2  . Years of education: 176 . Highest education level: Not on file  Occupational History  . Occupation: Retired    [employer] farm manager RETIRED    Comment: worked at [employer] . Financial resource strain: Not  on file  . Food insecurity:    Worry: Not on file    Inability: Not on file  . Transportation needs:    Medical: Not on file    Non-medical: Not on file  Tobacco Use  . Smoking status: Never Smoker  . Smokeless tobacco: Never Used  Substance and Sexual Activity  . Alcohol use: No  . Drug use: Never  . Sexual activity: Never  Lifestyle  . Physical activity:    Days per week: Not on file    Minutes per session: Not on file  . Stress: Not on file  Relationships  . Social connections:    Talks on phone: Not on file    Gets together: Not on file    Attends religious service: Not on file    Active member of club or organization: Not on file    Attends meetings of clubs or organizations: Not on file    Relationship status: Not on file  Other Topics Concern  . Not on file  Social History Narrative   Lives with husband.   Right-handed.   3 cups caffeine per day.     Family Hx: The patient's family history includes Brain cancer in her brother; Colon cancer (age of onset: 75) in her mother; Heart attack (age of onset: 31) in her father; Heart disease in her brother and brother; Kidney disease in her daughter.  ROS:   Please see the history of present illness.    ROS All other systems reviewed and are negative.   EKGs/Labs/Other Test Reviewed:    EKG:  EKG is  ordered today.  The ekg ordered today demonstrates normal sinus rhythm, heart rate 86, leftward axis, QTC 437, similar to old EKG  Recent Labs: 09/25/2017: B Natriuretic Peptide 106.3 11/23/2017: ALT 18; BUN 16; Creat 0.85;  Hemoglobin 15.6; Magnesium 1.9; Platelets 340; Potassium 4.0; Sodium 138 11/30/2017: TSH 0.59   Recent Lipid Panel Lab Results  Component Value Date/Time   CHOL 137 11/30/2017 11:43 AM   TRIG 126 11/30/2017 11:43 AM   HDL 69 11/30/2017 11:43 AM   CHOLHDL 2.0 11/30/2017 11:43 AM   LDLCALC 47 11/30/2017 11:43 AM    Physical Exam:    VS:  BP 100/60   Pulse 86   Ht 5' 2.5" (1.588 m)   Wt 162 lb 12.8 oz (73.8 kg)   SpO2 96%   BMI 29.30 kg/m     Wt Readings from Last 3 Encounters:  12/19/17 162 lb 12.8 oz (73.8 kg)  11/30/17 160 lb (72.6 kg)  11/28/17 161 lb (73 kg)     Physical Exam  Constitutional: She is oriented to person, place, and time. She appears well-developed and well-nourished. No distress.  HENT:  Head: Normocephalic and atraumatic.  Eyes: No scleral icterus.  Neck: No JVD present. Carotid bruit is not present.  Cardiovascular: Normal rate and regular rhythm.  No murmur heard. Pulses:      Dorsalis pedis pulses are 2+ on the right side, and 2+ on the left side.       Posterior tibial pulses are 2+ on the right side, and 2+ on the left side.  Pulmonary/Chest: Effort normal. She has no wheezes. She has no rales.  Abdominal: Soft. She exhibits no distension.  Musculoskeletal: She exhibits no edema.  Lymphadenopathy:    She has no cervical adenopathy.  Neurological: She is alert and oriented to person, place, and time.  Skin: Skin is warm and dry.  Psychiatric: She has  a normal mood and affect.    ASSESSMENT & PLAN:    Coronary artery disease involving native coronary artery of native heart without angina pectoris History of prior angioplasty to D1.  Nuclear stress test in 2016 demonstrated no ischemia.  She denies anginal symptoms.  Continue aspirin, statin.  Essential hypertension  The patient's blood pressure is controlled on her current regimen.  Continue current therapy.   Hyperlipidemia, unspecified hyperlipidemia type LDL optimal on most recent lab  work.  Continue current Rx.    Atherosclerosis of aorta (HCC)   Continue aspirin, statin.  Dispo:  Return in about 1 year (around 12/20/2018) for Routine Follow Up, w/ Richardson Dopp, PA-C.   Medication Adjustments/Labs and Tests Ordered: Current medicines are reviewed at length with the patient today.  Concerns regarding medicines are outlined above.  Orders/Tests:  Orders Placed This Encounter  Procedures  . EKG 12-Lead   Medication changes: No orders of the defined types were placed in this encounter.  Signed, Richardson Dopp, PA-C  12/19/2017 3:54 PM    [facility] [address], [place], [place]  [address] Phone: [phone]; Fax: [phone]

## 2017-12-21 ENCOUNTER — Encounter: Payer: Self-pay | Admitting: Gastroenterology

## 2017-12-21 ENCOUNTER — Ambulatory Visit (INDEPENDENT_AMBULATORY_CARE_PROVIDER_SITE_OTHER): Payer: PPO | Admitting: Gastroenterology

## 2017-12-21 VITALS — BP 128/70 | HR 74 | Ht 63.0 in | Wt 163.6 lb

## 2017-12-21 DIAGNOSIS — R110 Nausea: Secondary | ICD-10-CM | POA: Diagnosis not present

## 2017-12-21 DIAGNOSIS — R933 Abnormal findings on diagnostic imaging of other parts of digestive tract: Secondary | ICD-10-CM

## 2017-12-21 DIAGNOSIS — R1310 Dysphagia, unspecified: Secondary | ICD-10-CM

## 2017-12-21 DIAGNOSIS — K5903 Drug induced constipation: Secondary | ICD-10-CM | POA: Insufficient documentation

## 2017-12-21 DIAGNOSIS — T402X5A Adverse effect of other opioids, initial encounter: Principal | ICD-10-CM | POA: Insufficient documentation

## 2017-12-21 MED ORDER — LUBIPROSTONE 8 MCG PO CAPS: 8.0000 ug | ORAL_CAPSULE | Freq: Two times a day (BID) | ORAL | 2 refills | Status: DC

## 2017-12-21 NOTE — Patient Instructions (Signed)
If you are age 81 or older, your body mass index should be between 23-30. Your Body mass index is 28.98 kg/m. If this is out of the aforementioned range listed, please consider follow up with your Primary Care Provider.  We sent a prescription for Amitiza 8 mcg to your pharmacy.  We have given you samples to try.    You have been scheduled for an endoscopy. Please follow written instructions given to you at your visit today. If you use inhalers (even only as needed), please bring them with you on the day of your procedure. [name]

## 2017-12-21 NOTE — Progress Notes (Signed)
[name] [name]    12/21/2017 [name] [name] [id number] 29-Sep-1935   HISTORY OF PRESENT ILLNESS: This is an 82 year old female who is a patient of Dr. Doyne Keel who was evaluated by me back in June for complaints of dysphasia.  She had a normal EGD in 2017 to be started with an upper GI series.  This was performed in June and showed the following:  IMPRESSION: 1. Small hiatal hernia with fundoplication wrap extending above the diaphragm. In the vicinity of the wrap, the lumen could only be distended to about 8 mm in diameter, and a 13 mm barium tablet impacted just above the level of the wrap. It is possible that herniation of the wrap into the chest has cause some tethering which narrows the surrounded lumen. The narrowing in this vicinity is smooth. 2. Small Zenker's diverticulum.  She had recent vertebroplasty so I wanted to bring her back to the office to her assess her status on that.  She says that she has recovered from that.  She is wearing a back brace, but is for her comfort only and she can take it off without any issues.  She continues to have problems swallowing and says the food gets stuck and she has to discontinue eating.  Complains of a lot of nausea as well.  Also reports constipation, which is chronic and of course now she is on pain medications as well.  She is taking MiraLAX twice a day which does help but she is wondering if there is something else she can take.  Her PCP prescribed Movantik, which she picked up even though the prescription was expensive, but she never took it.  She says she would like to try something else instead first if it is not too expensive.  Past Medical History:  Diagnosis Date  . Anxiety    takes Xanax daily as needed  . Asthma    Albuterol daily as needed  . Blood transfusion without reported diagnosis   . CAD (coronary artery disease)    LHC 2003 with 40% pLAD, 30% mLAD, 100% D1 (moderate vessel), 100%  D2 (small vessel), 95% mid small OM2.  Pt had  PTCA to D1;  D2 and OM2 small vessels and tx medically;  Last Myoview (2012): anterior infarct seen with scar, no ischemia. EF normal. Med Rx // Myoview (12/14):  Low risk; ant scar w/ peri-infarct ischemia; EF 55% with ant AK // Myoview 5/16: EF 55-65, ant, apical scar, no ischemia   . Cataract   . Chronic back pain    HNP, DDD, spinal stenosis, vertebral compression fractures.   . Chronic nausea    takes Zofran daily as needed.  normal gastric emptying study 03/2013  . CKD (chronic kidney disease), stage III ([place]) 09/27/2017  . Constipation    felt to be functional. takes Senokot and Miralax daily.  treated with Linzess in past.   . DIVERTICULOSIS, COLON 08/28/2008   Qualifier: Diagnosis of  By: [name], [name], [name]    . Elevated LFTs 2015   probably med induced DILI, from Augmentin vs Pravastatin  . GERD (gastroesophageal reflux disease)    hx of esophageal stricture   . Hiatal hernia    Paraesophageal hernias. s/p fundoplications in 2979 and 04/2013  . History of bronchitis several of yrs ago  . History of colon polyps 03/2009, 03/2011   adenomatous colon polyps, no high grade dysplasia.   [name] History of vertigo    no meds  . Hyperlipidemia    takes  Pravastatin daily  . Hypertension    takes Amlodipine,Metoprolol,and Losartan daily  . Nausea 03/2016  . Osteoporosis   . PONV (postoperative nausea and vomiting)    yrs ago  . Slow urinary stream    takes Rapaflo daily  . TIA (transient ischemic attack) 1995   no residual effects noted   Past Surgical History:  Procedure Laterality Date  . APPENDECTOMY     patient unsure of date  . BACK SURGERY    . BLADDER REPAIR    . CHOLECYSTECTOMY     Patient unsure of date.  . CORONARY ANGIOPLASTY  11/16/2001   1 stent  . ESOPHAGEAL MANOMETRY N/A 03/25/2013   Procedure: ESOPHAGEAL MANOMETRY (EM);  Surgeon: Inda Castle, MD;  [facility];  Service: Endoscopy;  Laterality: N/A;  . ESOPHAGOGASTRODUODENOSCOPY N/A 03/15/2013    Procedure: ESOPHAGOGASTRODUODENOSCOPY (EGD);  Surgeon: Jerene Bears, MD;  Location: [facility] ENDOSCOPY;  Service: Endoscopy;  Laterality: N/A;  . ESOPHAGOGASTRODUODENOSCOPY N/A 12/25/2015   Procedure: ESOPHAGOGASTRODUODENOSCOPY (EGD);  Surgeon: Doran Stabler, MD;  Location: [facility] ENDOSCOPY;  Service: Endoscopy;  Laterality: N/A;  . EYE SURGERY  11/2000   bilateral cataracts with lens implant  . FIXATION KYPHOPLASTY LUMBAR SPINE  X 2   "L3-4"  . HEMORRHOID SURGERY    . HIATAL HERNIA REPAIR  06/03/2011   Procedure: LAPAROSCOPIC REPAIR OF HIATAL HERNIA;  Surgeon: Adin Hector, MD;  Location: [facility];  Service: General;  Laterality: N/A;  . HIATAL HERNIA REPAIR N/A 04/24/2013   Procedure: LAPAROSCOPIC  REPAIR RECURRENT PARASOPHAGEAL HIATAL HERNIA WITH FUNDOPLICATION;  Surgeon: Adin Hector, MD;  Location: [facility];  Service: General;  Laterality: N/A;  . INSERTION OF MESH N/A 04/24/2013   Procedure: INSERTION OF MESH;  Surgeon: Adin Hector, MD;  Location: [facility];  Service: General;  Laterality: N/A;  . IR KYPHO THORACIC WITH BONE BIOPSY  09/29/2017  . IR KYPHO THORACIC WITH BONE BIOPSY  11/03/2017  . LAPAROSCOPIC LYSIS OF ADHESIONS N/A 04/24/2013   Procedure: LAPAROSCOPIC LYSIS OF ADHESIONS;  Surgeon: Adin Hector, MD;  [facility];  Service: General;  Laterality: N/A;  . LAPAROSCOPIC NISSEN FUNDOPLICATION  7/59/1638   Procedure: LAPAROSCOPIC NISSEN FUNDOPLICATION;  Surgeon: Adin Hector, MD;  Location: [facility];  Service: General;  Laterality: N/A;  . LUMBAR FUSION  12/07/2015   Right L2-3 facetectomy, posterolateral fusion L2-3 with pedicle screws, rods, local bone graft, Vivigen cancellous chips. Fusion extended to the L1 level with pedicle screws and rods  . LUMBAR LAMINECTOMY/DECOMPRESSION MICRODISCECTOMY Right 06/26/2012   Procedure: LUMBAR LAMINECTOMY/DECOMPRESSION MICRODISCECTOMY;  Surgeon: Jessy Oto, MD;  Location: [place];  Service: Orthopedics;  Laterality: Right;  RIGHT L4-5  MICRODISCECTOMY  . LUMBAR LAMINECTOMY/DECOMPRESSION MICRODISCECTOMY N/A 05/29/2015   Procedure: RIGHT L2-3 MICRODISCECTOMY;  Surgeon: Jessy Oto, MD;  Location: [place];  Service: Orthopedics;  Laterality: N/A;  . TOTAL ABDOMINAL HYSTERECTOMY  1975   partial    reports that she has never smoked. She has never used smokeless tobacco. She reports that she does not drink alcohol or use drugs. family history includes Brain cancer in her brother; Colon cancer (age of onset: 43) in her mother; Heart attack (age of onset: 8) in her father; Heart disease in her brother and brother; Kidney disease in her daughter. Allergies  Allergen Reactions  . Ace Inhibitors Cough    Per Dr Melvyn Novas pulmonology 2013  . Aspartame Diarrhea and Nausea And Vomiting  . Atorvastatin Other (See Comments)  Muscle aches  . Biaxin [Clarithromycin] Other (See Comments)    PATIENT CAN TOLERATE Z-PAK.  Is written on patient's paper chart.  [name] [Roflumilast] Nausea And Vomiting  . Erythromycin Other (See Comments)    PATIENT CAN TOLERATE Z-PAK.  It is written on patient's paper chart.  . Levofloxacin Nausea And Vomiting      Outpatient Encounter Medications as of 12/21/2017  Medication Sig  . Abaloparatide (TYMLOS) 3120 MCG/1.56ML SOPN Inject 80 mcg into the skin daily.  [name] acetaminophen (TYLENOL) 500 MG tablet Take 1,000 mg by mouth every 6 (six) hours as needed for mild pain.  [name] albuterol (PROAIR HFA) 108 (90 Base) MCG/ACT inhaler Inhale 2 puffs into the lungs every 6 (six) hours as needed for wheezing or shortness of breath.  [name] alendronate (FOSAMAX) 70 MG tablet  TAKE 1 TABLET BY MOUTH ONCE A WEEK. TAKE WITH A FULL GLASS OF WATER ON AN EMPTY STOMACH.  [name] ALPRAZolam (XANAX) 0.5 MG tablet TAKE ONE-HALF TO ONE TABLET BY MOUTH 2 TO 3 TIMES DAILY ONLY IF ANXIETY ATTACK AND PLEASE TRY TO LIMIT TO 5 DAYS A WEEK TO AVOID ADDICTION  . amLODipine (NORVASC) 10 MG tablet TAKE 1 TABLET BY MOUTH AT BEDTIME  . aspirin EC 81 MG  tablet Take 81 mg by mouth daily.  . Cholecalciferol (VITAMIN D3) 5000 units CAPS Take 5,000 Units by mouth 2 (two) times daily.   . diclofenac sodium (VOLTAREN) 1 % GEL Apply 2 g topically 4 (four) times daily.  [name] dicyclomine (BENTYL) 10 MG capsule TAKE 1 CAPSULE BY MOUTH EVERY 8 HOURS AS NEEDED FOR SPASM  . gabapentin (NEURONTIN) 300 MG capsule TAKE 1 CAPSULE BY MOUTH THREE TIMES DAILY FOR  CHRONIC  PAIN.  [name] ipratropium-albuterol (DUONEB) 0.5-2.5 (3) MG/3ML SOLN Take 3 mLs by nebulization every 6 (six) hours as needed.  [name] ipratropium-albuterol (DUONEB) 0.5-2.5 (3) MG/3ML SOLN Take 3 mLs by nebulization every 6 (six) hours as needed.  [name] ketoconazole (NIZORAL) 2 % cream Apply 1 application topically 2 (two) times daily as needed for irritation.  [name] losartan (COZAAR) 100 MG tablet TAKE ONE TABLET BY MOUTH ONCE DAILY (DISCONTINUE  BENICAR)  . MAGNESIUM PO Take by mouth daily.  . meclizine (ANTIVERT) 25 MG tablet 1/2-1 pill up to 3 times daily for motion sickness/dizziness (Patient taking differently: Take 12.5-25 mg by mouth 3 (three) times daily as needed for dizziness. 1/2-1 pill up to 3 times daily for motion sickness/dizziness)  . methocarbamol (ROBAXIN) 500 MG tablet Take 1 tablet (500 mg total) by mouth every 6 (six) hours as needed for muscle spasms.  . metoprolol tartrate (LOPRESSOR) 50 MG tablet Take 50 mg by mouth at bedtime.  . mometasone-formoterol (DULERA) 100-5 MCG/ACT AERO Inhale 2 puffs into the lungs every 12 (twelve) hours. (Patient taking differently: Inhale 2 puffs into the lungs every 12 (twelve) hours as needed for wheezing or shortness of breath. )  . naloxegol oxalate (MOVANTIK) 25 MG TABS tablet Take 1 tablet (25 mg total) by mouth daily.  [name] neomycin-polymyxin b-dexamethasone (MAXITROL) 3.5-10000-0.1 OINT APPLY TO STYE FOUR TIMES DAILY.  . nitroGLYCERIN (NITROSTAT) 0.4 MG SL tablet Place 1 tablet (0.4 mg total) under the tongue every 5 (five) minutes as needed for chest pain.    [name] ondansetron (ZOFRAN-ODT) 8 MG disintegrating tablet DISSOLVE ONE TABLET IN MOUTH EVERY 8 HOURS AS NEEDED FOR NAUSEA AND  VOMITING  . OVER THE COUNTER MEDICATION Take 1,000 mg by mouth 2 (two) times daily. [place] Calcium  1000mg   . oxyCODONE-acetaminophen (PERCOCET/ROXICET) 5-325 MG tablet Take 1 tablet by mouth every 4 (four) hours as needed for severe pain.  . polyethylene glycol powder (GLYCOLAX/MIRALAX) powder Take 17 g by mouth 2 (two) times daily. (Patient taking differently: Take 17 g by mouth 2 (two) times daily as needed for mild constipation or moderate constipation. )  . promethazine (PHENERGAN) 25 MG suppository Place 1 suppository (25 mg total) rectally every 6 (six) hours as needed for vomiting.  . promethazine (PHENERGAN) 25 MG tablet Take 1 tablet (25 mg total) by mouth every 6 (six) hours as needed for nausea or vomiting.  . senna-docusate (SENOKOT-S) 8.6-50 MG tablet Take 2 tablets by mouth 2 (two) times daily.  . silodosin (RAPAFLO) 8 MG CAPS capsule Take 8 mg by mouth daily with breakfast.   . pravastatin (PRAVACHOL) 40 MG tablet Take 1 tablet (40 mg total) by mouth daily.  . [DISCONTINUED] diclofenac sodium (VOLTAREN) 1 % GEL Apply 2 g topically 4 (four) times daily.  . [DISCONTINUED] tamsulosin (FLOMAX) 0.4 MG CAPS capsule Take 0.4 mg by mouth daily.   No facility-administered encounter medications on file as of 12/21/2017.      REVIEW OF SYSTEMS  : All other systems reviewed and negative except where noted in the History of Present Illness.   PHYSICAL EXAM: BP 128/70   Pulse 74   Ht 5\' 3"  (1.6 m)   Wt 163 lb 9.6 oz (74.2 kg)   BMI 28.98 kg/m  General: Well developed white female in no acute distress; back brace on Head: Normocephalic and atraumatic Eyes:  Sclerae anicteric, conjunctiva pink. Ears: Normal auditory acuity Lungs: Clear throughout to auscultation; no increased WOB. Heart: Regular rate and rhythm; no M/R/G. Abdomen: Soft, non-distended.  BS present.   Non-tender. Musculoskeletal: Symmetrical with no gross deformities  Skin: No lesions on visible extremities Extremities: No edema  Neurological: Alert oriented x 4, grossly non-focal Psychological:  Alert and cooperative. Normal mood and affect  ASSESSMENT AND PLAN: *Dysphagia, nausea, and abnormal UGI:  Had UGI for evaluation that showed tethering of fundoplication wrap causing narrowing of lumen.  Had recent vertebroplasty, but seems recovered from that.  Will schedule EGD with dilation with Dr. Havery Moros. *OIC:  Doing well with Miralax BID but wondering if there is anything else that she can take.  She was given Movantik and got the prescription even though it was expensive but she has not tried it.  Would like to try something else for now.  Will try amitiza 8 mcg BID with food and can increase if needed (will give prescription and some samples).  **The risks, benefits, and alternatives to EGD with dilation were discussed with the patient and she consents to proceed.    CC:  [name], MD

## 2017-12-27 NOTE — Progress Notes (Signed)
Agree with assessment and plan as outlined. Hopefully EGD with dilation of the area of concern on barium swallow may provide some benefit. Depending on her anatomy however, it's possible it may not. Will await procedure and her course.

## 2017-12-28 DIAGNOSIS — Z79891 Long term (current) use of opiate analgesic: Secondary | ICD-10-CM | POA: Diagnosis not present

## 2017-12-28 DIAGNOSIS — M961 Postlaminectomy syndrome, not elsewhere classified: Secondary | ICD-10-CM | POA: Diagnosis not present

## 2017-12-28 DIAGNOSIS — M4726 Other spondylosis with radiculopathy, lumbar region: Secondary | ICD-10-CM | POA: Diagnosis not present

## 2017-12-28 DIAGNOSIS — G894 Chronic pain syndrome: Secondary | ICD-10-CM | POA: Diagnosis not present

## 2017-12-29 ENCOUNTER — Encounter (INDEPENDENT_AMBULATORY_CARE_PROVIDER_SITE_OTHER): Payer: Self-pay | Admitting: Specialist

## 2017-12-29 ENCOUNTER — Ambulatory Visit (INDEPENDENT_AMBULATORY_CARE_PROVIDER_SITE_OTHER): Payer: PPO | Admitting: Specialist

## 2017-12-29 ENCOUNTER — Ambulatory Visit (INDEPENDENT_AMBULATORY_CARE_PROVIDER_SITE_OTHER): Payer: Self-pay

## 2017-12-29 ENCOUNTER — Other Ambulatory Visit (INDEPENDENT_AMBULATORY_CARE_PROVIDER_SITE_OTHER): Payer: Self-pay | Admitting: Radiology

## 2017-12-29 ENCOUNTER — Other Ambulatory Visit (INDEPENDENT_AMBULATORY_CARE_PROVIDER_SITE_OTHER): Payer: Self-pay

## 2017-12-29 VITALS — BP 130/77 | HR 70 | Ht 63.0 in | Wt 163.0 lb

## 2017-12-29 DIAGNOSIS — M8448XD Pathological fracture, other site, subsequent encounter for fracture with routine healing: Secondary | ICD-10-CM

## 2017-12-29 DIAGNOSIS — Z981 Arthrodesis status: Secondary | ICD-10-CM | POA: Diagnosis not present

## 2017-12-29 DIAGNOSIS — M4014 Other secondary kyphosis, thoracic region: Secondary | ICD-10-CM | POA: Diagnosis not present

## 2017-12-29 NOTE — Progress Notes (Signed)
Office Visit Note   Patient: [name] [name]           Date of Birth: April 17, 1936           MRN: [id number] Visit Date: 12/29/2017              Requested by: [name], [address] [place], [place] [address] PCP: [name], MD   Assessment & Plan: Visit Diagnoses:  1. Pathologic thoracic fracture, with routine healing, subsequent encounter   2. Other secondary kyphosis, thoracic region   3. History of lumbar fusion     Plan: Avoid frequent bending and stooping  No lifting greater than 10 lbs. May use ice or moist heat for pain. Weight loss is of benefit. Use the brace to prevent deformity at the upper area above the fusion site where you have had cement placed in the vertebra for adjacent level fractures due to osteoporosis. Exercise is important to improve your indurance and does allow people to function better inspite of back pain.  Follow-Up Instructions: No follow-ups on file.   Orders:  Orders Placed This Encounter  Procedures  . XR Thoracic Spine 2 View  . XR SCOLIOSIS EVAL COMPLETE SPINE 2 OR 3 VIEWS   No orders of the defined types were placed in this encounter.     Procedures: No procedures performed   Clinical Data: No additional findings.   Subjective: Chief Complaint  Patient presents with  . Middle Back - Follow-up    82 year old female with osteoporosis and previous lumbar spinal stenosis with scoliosis at L2-3 and L3-4. Eventual decompression and fusion with instrumentation from L   Review of Systems   Objective: Vital Signs: BP 130/77   Pulse 70   Ht 5\' 3"  (1.6 m)   Wt 163 lb (73.9 kg)   BMI 28.87 kg/m   Physical Exam  Constitutional: She is oriented to person, place, and time. She appears well-developed and well-nourished.  HENT:  Head: Normocephalic and atraumatic.  Eyes: Pupils are equal, round, and reactive to light. EOM are normal.  Neck: Normal range of motion. Neck supple.  Pulmonary/Chest:  Effort normal and breath sounds normal.  Abdominal: Soft. Bowel sounds are normal.  Neurological: She is alert and oriented to person, place, and time.  Skin: Skin is warm and dry.  Psychiatric: She has a normal mood and affect. Her behavior is normal. Judgment and thought content normal.    Back Exam   Tenderness  The patient is experiencing tenderness in the lumbar.  Range of Motion  Extension: abnormal  Flexion: abnormal  Lateral bend right: abnormal  Lateral bend left: abnormal  Rotation right: abnormal  Rotation left: abnormal   Muscle Strength  Right Quadriceps:  5/5  Left Quadriceps:  5/5  Right Hamstrings:  5/5  Left Hamstrings:  5/5   Tests  Straight leg raise right: negative Straight leg raise left: negative  Reflexes  Patellar: 0/4 Achilles: 0/4 Babinski's sign: normal   Other  Toe walk: normal Heel walk: normal Sensation: normal Gait: normal  Erythema: no back redness Scars: present  Comments:  Increased dorsal kyphosis at the lower dorsal spine.       Specialty Comments:  No specialty comments available.  Imaging: No results found.   PMFS History: Patient Active Problem List   Diagnosis Date Noted  . Lumbar radiculopathy, acute 12/07/2015    Priority: High    Class: Chronic  . Herniated lumbar intervertebral disc 06/25/2012  Priority: High    Class: Acute  . Therapeutic opioid-induced constipation (OIC) 12/21/2017  . Abnormal UGI series 12/21/2017  . CKD (chronic kidney disease), stage III ([place]) 09/27/2017  . T12 compression fracture ([place]) 09/26/2017  . Chronic bronchitis ([place]) 08/09/2017  . Atherosclerosis of aorta ([place]) 04/05/2017  . Costochondritis 11/14/2016  . Anxiety state 06/08/2016  . Spondylolisthesis, lumbar region 04/13/2016  . Dysphagia   . Spinal stenosis, lumbar region, with neurogenic claudication 05/29/2015  . DDD (degenerative disc disease), lumbar 04/21/2015  . Mixed hyperlipidemia 11/24/2014  . GERD   11/24/2014  . Depression, major, in partial remission ([place]) 09/26/2014  . Chronic pain syndrome 09/26/2014  . Coronary artery disease due to lipid rich plaque   . Osteoporosis 02/10/2014  . Prediabetes 08/14/2013  . Vitamin D deficiency 08/14/2013  . Medication management 08/14/2013  . Constipation, chronic 04/03/2013  . MGUS (monoclonal gammopathy of unknown significance) 03/05/2013  . Vertebral compression fracture ([place]) 06/23/2012  . ESOPHAGEAL STRICTURE 08/28/2008  . Mild intermittent asthma without complication 19/14/7829  . Incarcerated recurrent paraesophageal hiatal hernia s/p lap redo repair [id number] 01/31/2008  . Essential hypertension 07/25/2007   Past Medical History:  Diagnosis Date  . Anxiety    takes Xanax daily as needed  . Asthma    Albuterol daily as needed  . Blood transfusion without reported diagnosis   . CAD (coronary artery disease)    LHC 2003 with 40% pLAD, 30% mLAD, 100% D1 (moderate vessel), 100%  D2 (small vessel), 95% mid small OM2.  Pt had PTCA to D1;  D2 and OM2 small vessels and tx medically;  Last Myoview (2012): anterior infarct seen with scar, no ischemia. EF normal. Med Rx // Myoview (12/14):  Low risk; ant scar w/ peri-infarct ischemia; EF 55% with ant AK // Myoview 5/16: EF 55-65, ant, apical scar, no ischemia   . Cataract   . Chronic back pain    HNP, DDD, spinal stenosis, vertebral compression fractures.   . Chronic nausea    takes Zofran daily as needed.  normal gastric emptying study 03/2013  . CKD (chronic kidney disease), stage III ([place]) 09/27/2017  . Constipation    felt to be functional. takes Senokot and Miralax daily.  treated with Linzess in past.   . DIVERTICULOSIS, COLON 08/28/2008   Qualifier: Diagnosis of  By: [name], [name], [name]    . Elevated LFTs 2015   probably med induced DILI, from Augmentin vs Pravastatin  . GERD (gastroesophageal reflux disease)    hx of esophageal stricture   . Hiatal hernia    Paraesophageal hernias.  s/p fundoplications in 6578 and 04/2013  . History of bronchitis several of yrs ago  . History of colon polyps 03/2009, 03/2011   adenomatous colon polyps, no high grade dysplasia.   [name] History of vertigo    no meds  . Hyperlipidemia    takes Pravastatin daily  . Hypertension    takes Amlodipine,Metoprolol,and Losartan daily  . Nausea 03/2016  . Osteoporosis   . PONV (postoperative nausea and vomiting)    yrs ago  . Slow urinary stream    takes Rapaflo daily  . TIA (transient ischemic attack) 1995   no residual effects noted    Family History  Problem Relation Age of Onset  . Colon cancer Mother 39  . Heart attack Father 44       heart attack  . Brain cancer Brother        tumor   . Heart disease Brother   .  Heart disease Brother   . Kidney disease Daughter     Past Surgical History:  Procedure Laterality Date  . APPENDECTOMY     patient unsure of date  . BACK SURGERY    . BLADDER REPAIR    . CHOLECYSTECTOMY     Patient unsure of date.  . CORONARY ANGIOPLASTY  11/16/2001   1 stent  . ESOPHAGEAL MANOMETRY N/A 03/25/2013   Procedure: ESOPHAGEAL MANOMETRY (EM);  Surgeon: Inda Castle, MD;  [facility];  Service: Endoscopy;  Laterality: N/A;  . ESOPHAGOGASTRODUODENOSCOPY N/A 03/15/2013   Procedure: ESOPHAGOGASTRODUODENOSCOPY (EGD);  Surgeon: Jerene Bears, MD;  Location: [facility] ENDOSCOPY;  Service: Endoscopy;  Laterality: N/A;  . ESOPHAGOGASTRODUODENOSCOPY N/A 12/25/2015   Procedure: ESOPHAGOGASTRODUODENOSCOPY (EGD);  Surgeon: Doran Stabler, MD;  Location: [facility] ENDOSCOPY;  Service: Endoscopy;  Laterality: N/A;  . EYE SURGERY  11/2000   bilateral cataracts with lens implant  . FIXATION KYPHOPLASTY LUMBAR SPINE  X 2   "L3-4"  . HEMORRHOID SURGERY    . HIATAL HERNIA REPAIR  06/03/2011   Procedure: LAPAROSCOPIC REPAIR OF HIATAL HERNIA;  Surgeon: Adin Hector, MD;  [facility];  Service: General;  Laterality: N/A;  . HIATAL HERNIA REPAIR N/A 04/24/2013    Procedure: LAPAROSCOPIC  REPAIR RECURRENT PARASOPHAGEAL HIATAL HERNIA WITH FUNDOPLICATION;  Surgeon: Adin Hector, MD;  Location: [facility];  Service: General;  Laterality: N/A;  . INSERTION OF MESH N/A 04/24/2013   Procedure: INSERTION OF MESH;  Surgeon: Adin Hector, MD;  [facility]: [facility];  Service: General;  Laterality: N/A;  . IR KYPHO THORACIC WITH BONE BIOPSY  09/29/2017  . IR KYPHO THORACIC WITH BONE BIOPSY  11/03/2017  . LAPAROSCOPIC LYSIS OF ADHESIONS N/A 04/24/2013   Procedure: LAPAROSCOPIC LYSIS OF ADHESIONS;  Surgeon: Adin Hector, MD;  [facility];  Service: General;  Laterality: N/A;  . LAPAROSCOPIC NISSEN FUNDOPLICATION  8/84/1660   Procedure: LAPAROSCOPIC NISSEN FUNDOPLICATION;  Surgeon: Adin Hector, MD;  [facility]: [facility];  Service: General;  Laterality: N/A;  . LUMBAR FUSION  12/07/2015   Right L2-3 facetectomy, posterolateral fusion L2-3 with pedicle screws, rods, local bone graft, Vivigen cancellous chips. Fusion extended to the L1 level with pedicle screws and rods  . LUMBAR LAMINECTOMY/DECOMPRESSION MICRODISCECTOMY Right 06/26/2012   Procedure: LUMBAR LAMINECTOMY/DECOMPRESSION MICRODISCECTOMY;  Surgeon: Jessy Oto, MD;  Location: [place];  Service: Orthopedics;  Laterality: Right;  RIGHT L4-5 MICRODISCECTOMY  . LUMBAR LAMINECTOMY/DECOMPRESSION MICRODISCECTOMY N/A 05/29/2015   Procedure: RIGHT L2-3 MICRODISCECTOMY;  Surgeon: Jessy Oto, MD;  Location: [place];  Service: Orthopedics;  Laterality: N/A;  . TOTAL ABDOMINAL HYSTERECTOMY  1975   partial   Social History   Occupational History  . Occupation: Retired    Fish farm manager: RETIRED    Comment: worked at [employer]  . Smoking status: Never Smoker  . Smokeless tobacco: Never Used  Substance and Sexual Activity  . Alcohol use: No  . Drug use: Never  . Sexual activity: Never

## 2017-12-29 NOTE — Patient Instructions (Signed)
Avoid frequent bending and stooping  No lifting greater than 10 lbs. May use ice or moist heat for pain. Weight loss is of benefit. Use the brace to prevent deformity at the upper area above the fusion site where you have had cement placed in the vertebra for adjacent level fractures due to osteoporosis. Exercise is important to improve your indurance and does allow people to function better inspite of back pain.

## 2018-01-02 DIAGNOSIS — H018 Other specified inflammations of eyelid: Secondary | ICD-10-CM | POA: Diagnosis not present

## 2018-01-02 DIAGNOSIS — H00021 Hordeolum internum right upper eyelid: Secondary | ICD-10-CM | POA: Diagnosis not present

## 2018-01-03 DIAGNOSIS — H00021 Hordeolum internum right upper eyelid: Secondary | ICD-10-CM | POA: Diagnosis not present

## 2018-01-03 DIAGNOSIS — H018 Other specified inflammations of eyelid: Secondary | ICD-10-CM | POA: Diagnosis not present

## 2018-01-10 ENCOUNTER — Encounter: Payer: Self-pay | Admitting: Gastroenterology

## 2018-01-17 DIAGNOSIS — H0011 Chalazion right upper eyelid: Secondary | ICD-10-CM | POA: Diagnosis not present

## 2018-01-18 ENCOUNTER — Other Ambulatory Visit (INDEPENDENT_AMBULATORY_CARE_PROVIDER_SITE_OTHER): Payer: Self-pay | Admitting: Specialist

## 2018-01-18 ENCOUNTER — Telehealth (INDEPENDENT_AMBULATORY_CARE_PROVIDER_SITE_OTHER): Payer: Self-pay | Admitting: Specialist

## 2018-01-18 ENCOUNTER — Ambulatory Visit
Admission: RE | Admit: 2018-01-18 | Discharge: 2018-01-18 | Disposition: A | Discharge status: Home / Self Care | Payer: PPO | Source: Ambulatory Visit | Attending: Specialist | Admitting: Specialist

## 2018-01-18 DIAGNOSIS — M8448XD Pathological fracture, other site, subsequent encounter for fracture with routine healing: Secondary | ICD-10-CM

## 2018-01-18 DIAGNOSIS — S22080A Wedge compression fracture of T11-T12 vertebra, initial encounter for closed fracture: Secondary | ICD-10-CM | POA: Diagnosis not present

## 2018-01-18 NOTE — Telephone Encounter (Signed)
Patient said she had a test ordered on 8/23 from Dr. Louanne Skye and that she called [facility] and they said she would not need an appointment. Patient is not even sure what the test is and I couldn't find it in the referrals. Can you let patient know what it is she is supposed to have done? Also, her appt was cancelled for tomorrow since she has not had test done?-------can you please advise.  I don't see anything chart

## 2018-01-18 NOTE — Telephone Encounter (Signed)
Patient said she had a test ordered on 8/23 from Dr. Louanne Skye and that she called [facility] and they said she would not need an appointment. Patient is not even sure what the test is and I couldn't find it in the referrals. Can you let patient know what it is she is supposed to have done? Also, her appt was cancelled for tomorrow since she has not had test done? Please advise # [phone]

## 2018-01-19 ENCOUNTER — Ambulatory Visit (INDEPENDENT_AMBULATORY_CARE_PROVIDER_SITE_OTHER): Payer: PPO | Admitting: Specialist

## 2018-01-22 ENCOUNTER — Encounter (INDEPENDENT_AMBULATORY_CARE_PROVIDER_SITE_OTHER): Payer: Self-pay | Admitting: Specialist

## 2018-01-22 ENCOUNTER — Ambulatory Visit (INDEPENDENT_AMBULATORY_CARE_PROVIDER_SITE_OTHER): Payer: PPO | Admitting: Specialist

## 2018-01-22 VITALS — BP 116/70 | HR 72 | Ht 63.0 in | Wt 163.0 lb

## 2018-01-22 DIAGNOSIS — M4030 Flatback syndrome, site unspecified: Secondary | ICD-10-CM

## 2018-01-22 DIAGNOSIS — M810 Age-related osteoporosis without current pathological fracture: Secondary | ICD-10-CM | POA: Diagnosis not present

## 2018-01-22 NOTE — Progress Notes (Signed)
Office Visit Note   Patient: [name] [name]           Date of Birth: 04/01/1936           MRN: [id number] Visit Date: 01/22/2018              Requested by: [name], MD [address] [place], [place] [address] PCP: [name], MD   Assessment & Plan: Visit Diagnoses:  1. Flat back syndrome, acquired   2. Age-related osteoporosis without current pathological fracture     Plan:Do hip extension exercises.  Avoid frequent bending and stooping  No lifting greater than 10 lbs. May use ice or moist heat for pain. Weight loss is of benefit. Best medication for lumbar disc disease is arthritis medications likedtylenol.. Exercise is important to improve your indurance and does allow people to function better inspite of back pain.  ddddddddddddddddddd Follow-Up Instructions: No follow-ups on file.   Orders:  No orders of the defined types were placed in this encounter.  No orders of the defined types were placed in this encounter.     Procedures: No procedures performed   Clinical Data: Findings:  AP and lateral scoliosis long length views demonstrated sagitall alignment of the spine with anterior shift with a  Vertical line throught the mid body of the C7 vertebra anterior to the sacrum by about 6 cm.    Subjective: Chief Complaint  Patient presents with  . Middle Back - Follow-up    82 year old female with history of osteoporosis and spinal stenosis, collapsing degenerative disc disease with Multiple compression deformities of the lower thoracic and nearly every level in the lumbar spine. No bowel or bladder difficulty. Standing and walking with a cane no wheel chair. Uses a walker with a seat.    Review of Systems  Constitutional: Negative.   HENT: Negative.   Eyes: Negative.   Respiratory: Negative.   Cardiovascular: Negative.   Gastrointestinal: Negative.   Endocrine: Negative.   Genitourinary: Negative.   Musculoskeletal:  Negative.   Skin: Negative.   Allergic/Immunologic: Negative.  Negative for food allergies.  Neurological: Negative.   Hematological: Negative.   Psychiatric/Behavioral: Negative.      Objective: Vital Signs: BP 116/70   Pulse 72   Ht 5\' 3"  (1.6 m)   Wt 163 lb (73.9 kg)   BMI 28.87 kg/m   Physical Exam  Constitutional: She is oriented to person, place, and time. She appears well-developed and well-nourished.  HENT:  Head: Normocephalic and atraumatic.  Eyes: Pupils are equal, round, and reactive to light. EOM are normal.  Neck: Normal range of motion. Neck supple.  Pulmonary/Chest: Effort normal and breath sounds normal.  Abdominal: Soft. Bowel sounds are normal.  Neurological: She is alert and oriented to person, place, and time.  Skin: Skin is warm and dry.  Psychiatric: She has a normal mood and affect. Her behavior is normal. Judgment and thought content normal.    Back Exam   Tenderness  The patient is experiencing tenderness in the lumbar.  Range of Motion  Extension: abnormal  Flexion: abnormal  Lateral bend right: normal  Lateral bend left: normal  Rotation right: normal  Rotation left: normal   Muscle Strength  Right Quadriceps:  5/5  Left Quadriceps:  5/5  Right Hamstrings:  5/5  Left Hamstrings:  5/5   Tests  Straight leg raise right: negative Straight leg raise left: negative  Reflexes  Patellar: Hyporeflexic Achilles: Hyporeflexic Babinski's sign: normal  Other  Heel walk: normal Sensation: normal Gait: normal  Erythema: no back redness Scars: absent      Specialty Comments:  No specialty comments available.  Imaging: No results found.   PMFS History: Patient Active Problem List   Diagnosis Date Noted  . Lumbar radiculopathy, acute 12/07/2015    Priority: High    Class: Chronic  . Herniated lumbar intervertebral disc 06/25/2012    Priority: High    Class: Acute  . Therapeutic opioid-induced constipation (OIC)  12/21/2017  . Abnormal UGI series 12/21/2017  . CKD (chronic kidney disease), stage III ([place]) 09/27/2017  . T12 compression fracture ([place]) 09/26/2017  . Chronic bronchitis ([place]) 08/09/2017  . Atherosclerosis of aorta ([place]) 04/05/2017  . Costochondritis 11/14/2016  . Anxiety state 06/08/2016  . Spondylolisthesis, lumbar region 04/13/2016  . Dysphagia   . Spinal stenosis, lumbar region, with neurogenic claudication 05/29/2015  . DDD (degenerative disc disease), lumbar 04/21/2015  . Mixed hyperlipidemia 11/24/2014  . GERD  11/24/2014  . Depression, major, in partial remission ([place]) 09/26/2014  . Chronic pain syndrome 09/26/2014  . Coronary artery disease due to lipid rich plaque   . Osteoporosis 02/10/2014  . Prediabetes 08/14/2013  . Vitamin D deficiency 08/14/2013  . Medication management 08/14/2013  . Constipation, chronic 04/03/2013  . MGUS (monoclonal gammopathy of unknown significance) 03/05/2013  . Vertebral compression fracture ([place]) 06/23/2012  . ESOPHAGEAL STRICTURE 08/28/2008  . Mild intermittent asthma without complication 56/31/4970  . Incarcerated recurrent paraesophageal hiatal hernia s/p lap redo repair [id number] 01/31/2008  . Essential hypertension 07/25/2007   Past Medical History:  Diagnosis Date  . Anxiety    takes Xanax daily as needed  . Asthma    Albuterol daily as needed  . Blood transfusion without reported diagnosis   . CAD (coronary artery disease)    LHC 2003 with 40% pLAD, 30% mLAD, 100% D1 (moderate vessel), 100%  D2 (small vessel), 95% mid small OM2.  Pt had PTCA to D1;  D2 and OM2 small vessels and tx medically;  Last Myoview (2012): anterior infarct seen with scar, no ischemia. EF normal. Med Rx // Myoview (12/14):  Low risk; ant scar w/ peri-infarct ischemia; EF 55% with ant AK // Myoview 5/16: EF 55-65, ant, apical scar, no ischemia   . Cataract   . Chronic back pain    HNP, DDD, spinal stenosis, vertebral compression fractures.   . Chronic  nausea    takes Zofran daily as needed.  normal gastric emptying study 03/2013  . CKD (chronic kidney disease), stage III ([place]) 09/27/2017  . Constipation    felt to be functional. takes Senokot and Miralax daily.  treated with Linzess in past.   . DIVERTICULOSIS, COLON 08/28/2008   Qualifier: Diagnosis of  By: [name], [name], [name]    . Elevated LFTs 2015   probably med induced DILI, from Augmentin vs Pravastatin  . GERD (gastroesophageal reflux disease)    hx of esophageal stricture   . Hiatal hernia    Paraesophageal hernias. s/p fundoplications in 8502 and 04/2013  . History of bronchitis several of yrs ago  . History of colon polyps 03/2009, 03/2011   adenomatous colon polyps, no high grade dysplasia.   [name] History of vertigo    no meds  . Hyperlipidemia    takes Pravastatin daily  . Hypertension    takes Amlodipine,Metoprolol,and Losartan daily  . Nausea 03/2016  . Osteoporosis   . PONV (postoperative nausea and vomiting)    yrs ago  .  Slow urinary stream    takes Rapaflo daily  . TIA (transient ischemic attack) 1995   no residual effects noted    Family History  Problem Relation Age of Onset  . Colon cancer Mother 108  . Heart attack Father 22       heart attack  . Brain cancer Brother        tumor   . Heart disease Brother   . Heart disease Brother   . Kidney disease Daughter     Past Surgical History:  Procedure Laterality Date  . APPENDECTOMY     patient unsure of date  . BACK SURGERY    . BLADDER REPAIR    . CHOLECYSTECTOMY     Patient unsure of date.  . CORONARY ANGIOPLASTY  11/16/2001   1 stent  . ESOPHAGEAL MANOMETRY N/A 03/25/2013   Procedure: ESOPHAGEAL MANOMETRY (EM);  Surgeon: Inda Castle, MD;  Location: [facility];  Service: Endoscopy;  Laterality: N/A;  . ESOPHAGOGASTRODUODENOSCOPY N/A 03/15/2013   Procedure: ESOPHAGOGASTRODUODENOSCOPY (EGD);  Surgeon: Jerene Bears, MD;  Location[facility] END[facility];  Service: Endoscopy;  Laterality: N/A;  .  ESOPHAGOGASTRODUODENOSCOPY N/A 12/25/2015   Procedure: ESOPHAGOGASTRODUODENOSCOPY (EGD);  Surgeon: Doran Stabler, MD;  Location: [facility] ENDOSCOPY;  Service: Endoscopy;  Laterality: N/A;  . EYE SURGERY  11/2000   bilateral cataracts with lens implant  . FIXATION KYPHOPLASTY LUMBAR SPINE  X 2   "L3-4"  . HEMORRHOID SURGERY    . HIATAL HERNIA REPAIR  06/03/2011   Procedure: LAPAROSCOPIC REPAIR OF HIATAL HERNIA;  Surgeon: Adin Hector, MD;  Location: [facility];  Service: General;  Laterality: N/A;  . HIATAL HERNIA REPAIR N/A 04/24/2013   Procedure: LAPAROSCOPIC  REPAIR RECURRENT PARASOPHAGEAL HIATAL HERNIA WITH FUNDOPLICATION;  Surgeon: Adin Hector, MD;  Location: [facility];  Service: General;  Laterality: N/A;  . INSERTION OF MESH N/A 04/24/2013   Procedure: INSERTION OF MESH;  Surgeon: Adin Hector, MD;  Location: [facility];  Service: General;  Laterality: N/A;  . IR KYPHO THORACIC WITH BONE BIOPSY  09/29/2017  . IR KYPHO THORACIC WITH BONE BIOPSY  11/03/2017  . LAPAROSCOPIC LYSIS OF ADHESIONS N/A 04/24/2013   Procedure: LAPAROSCOPIC LYSIS OF ADHESIONS;  Surgeon: Adin Hector, MD;  Location: [facility];  Service: General;  Laterality: N/A;  . LAPAROSCOPIC NISSEN FUNDOPLICATION  08/22/6061   Procedure: LAPAROSCOPIC NISSEN FUNDOPLICATION;  Surgeon: Adin Hector, MD;  Location: [facility];  Service: General;  Laterality: N/A;  . LUMBAR FUSION  12/07/2015   Right L2-3 facetectomy, posterolateral fusion L2-3 with pedicle screws, rods, local bone graft, Vivigen cancellous chips. Fusion extended to the L1 level with pedicle screws and rods  . LUMBAR LAMINECTOMY/DECOMPRESSION MICRODISCECTOMY Right 06/26/2012   Procedure: LUMBAR LAMINECTOMY/DECOMPRESSION MICRODISCECTOMY;  Surgeon: Jessy Oto, MD;  Location: [facility];  Service: Orthopedics;  Laterality: Right;  RIGHT L4-5 MICRODISCECTOMY  . LUMBAR LAMINECTOMY/DECOMPRESSION MICRODISCECTOMY N/A 05/29/2015   Procedure: RIGHT L2-3 MICRODISCECTOMY;  Surgeon: Jessy Oto, MD;  Location: [facility];  Service: Orthopedics;  Laterality: N/A;  . TOTAL ABDOMINAL HYSTERECTOMY  1975   partial   Social History   Occupational History  . Occupation: Retired    Fish farm manager: RETIRED    Comment: worked at [employer]  . Smoking status: Never Smoker  . Smokeless tobacco: Never Used  Substance and Sexual Activity  . Alcohol use: No  . Drug use: Never  . Sexual activity: Never

## 2018-01-22 NOTE — Patient Instructions (Signed)
Do hip extension exercises.  Avoid frequent bending and stooping  No lifting greater than 10 lbs. May use ice or moist heat for pain. Weight loss is of benefit. Best medication for lumbar disc disease is arthritis medications like motrin, celebrex and naprosyn. Exercise is important to improve your indurance and does allow people to function better inspite of back pain.

## 2018-01-24 ENCOUNTER — Encounter: Payer: Self-pay | Admitting: Gastroenterology

## 2018-01-24 ENCOUNTER — Ambulatory Visit (AMBULATORY_SURGERY_CENTER): Payer: PPO | Admitting: Gastroenterology

## 2018-01-24 VITALS — BP 133/57 | HR 56 | Temp 97.5°F | Resp 18 | Ht 63.0 in | Wt 163.0 lb

## 2018-01-24 DIAGNOSIS — Z8673 Personal history of transient ischemic attack (TIA), and cerebral infarction without residual deficits: Secondary | ICD-10-CM | POA: Diagnosis not present

## 2018-01-24 DIAGNOSIS — Z86010 Personal history of colonic polyps: Secondary | ICD-10-CM | POA: Diagnosis not present

## 2018-01-24 DIAGNOSIS — T182XXA Foreign body in stomach, initial encounter: Secondary | ICD-10-CM | POA: Diagnosis not present

## 2018-01-24 DIAGNOSIS — R1310 Dysphagia, unspecified: Secondary | ICD-10-CM | POA: Diagnosis not present

## 2018-01-24 DIAGNOSIS — I2510 Atherosclerotic heart disease of native coronary artery without angina pectoris: Secondary | ICD-10-CM | POA: Diagnosis not present

## 2018-01-24 DIAGNOSIS — F419 Anxiety disorder, unspecified: Secondary | ICD-10-CM | POA: Diagnosis not present

## 2018-01-24 MED ORDER — OMEPRAZOLE 20 MG PO CPDR: 20.0000 mg | DELAYED_RELEASE_CAPSULE | Freq: Every day | ORAL | 1 refills | Status: DC

## 2018-01-24 MED ORDER — SODIUM CHLORIDE 0.9 % IV SOLN: 500.0000 mL | Freq: Once | INTRAVENOUS | Status: DC

## 2018-01-24 NOTE — Progress Notes (Signed)
Pt's states no medical or surgical changes since previsit or office visit. 

## 2018-01-24 NOTE — Op Note (Signed)
[facility] Patient Name: [name] [name] Procedure Date: 01/24/2018 9:42 AM MRN: [id number] Endoscopist: Remo Lipps P. Havery Moros , MD Age: 82 Referring MD:  Date of Birth: Dec 22, 1935 Gender: Female Account #: [id number] Procedure:                Upper GI endoscopy Indications:              Dysphagia, Abnormal UGI series with stenosis at the                            GEJ at level of fundoplication with barium tablet                            noted to hang up in this area Medicines:                Monitored Anesthesia Care Procedure:                Pre-Anesthesia Assessment:                           - Prior to the procedure, a History and Physical                            was performed, and patient medications and                            allergies were reviewed. The patient's tolerance of                            previous anesthesia was also reviewed. The risks                            and benefits of the procedure and the sedation                            options and risks were discussed with the patient.                            All questions were answered, and informed consent                            was obtained. Prior Anticoagulants: The patient has                            taken no previous anticoagulant or antiplatelet                            agents. ASA Grade Assessment: III - A patient with                            severe systemic disease. After reviewing the risks                            and benefits, the patient was deemed in  satisfactory condition to undergo the procedure.                           After obtaining informed consent, the endoscope was                            passed under direct vision. Throughout the                            procedure, the patient's blood pressure, pulse, and                            oxygen saturations were monitored continuously. The                            Endoscope was  introduced through the mouth, and                            advanced to the body of the stomach. The upper GI                            endoscopy was accomplished without difficulty. The                            patient tolerated the procedure well. Scope In: Scope Out: Findings:                 The Z-line was found 35 cm from the incisors.                           Localized mucosal changes characterized by sloughed                            mucosa, concerning for esophagitis dissecans, was                            found in the lower third of the esophagus.                           The esophagus was tortous with a suspected mild                            luminal narrowing versus angulated turn entering                            the fundoplication. The exam of the esophagus was                            otherwise normal.                           A large amount of food was found in the gastric  fundus, in the gastric body and in the gastric                            antrum. A suspected fundoplication was noted                            however could not be evaluated due to food remnants                            in the cardia. The procedure was aborted given the                            volume of food noted, dilation was not performed at                            this time due to risk of aspiration. Complications:            No immediate complications. Estimated blood loss:                            None. Estimated Blood Loss:     Estimated blood loss: none. Impression:               - Z-line, 35 cm from the incisors.                           - Suspect mild esophagitis dissecans                           - Tortous esophagus with angulated entrance to                            fundoplication versus mild stenosis - dilation not                            performed given volume of food in the stomach                           - A large amount of  food (residue) in the stomach,                            the procedure was aborted. Recommendation:           - Patient has a contact number available for                            emergencies. The signs and symptoms of potential                            delayed complications were discussed with the                            patient. Return to normal activities tomorrow.  Written discharge instructions were provided to the                            patient.                           - Resume previous diet.                           - Continue present medications.                           - If symptoms of reflux or chest discomfort, start                            omeprazole 20mg  once daily                           - Repeat upper endoscopy for dilation of lower                            esophagus / fundoplication given barium swallow                            findings - would need to be on clear liquids the                            night prior to the exam given food noted in the                            stomach on this exam. Carlota Raspberry. Armbruster, MD 01/24/2018 10:10:08 AM This report has been signed electronically.

## 2018-01-24 NOTE — Patient Instructions (Signed)
Continue present medications. Begin Omeprazole 20 mg daily.    YOU HAD AN ENDOSCOPIC PROCEDURE TODAY AT [facility]:   Refer to the procedure report that was given to you for any specific questions about what was found during the examination.  If the procedure report does not answer your questions, please call your gastroenterologist to clarify.  If you requested that your care partner not be given the details of your procedure findings, then the procedure report has been included in a sealed envelope for you to review at your convenience later.  YOU SHOULD EXPECT: Some feelings of bloating in the abdomen. Passage of more gas than usual.  Walking can help get rid of the air that was put into your GI tract during the procedure and reduce the bloating. If you had a lower endoscopy (such as a colonoscopy or flexible sigmoidoscopy) you may notice spotting of blood in your stool or on the toilet paper. If you underwent a bowel prep for your procedure, you may not have a normal bowel movement for a few days.  Please Note:  You might notice some irritation and congestion in your nose or some drainage.  This is from the oxygen used during your procedure.  There is no need for concern and it should clear up in a day or so.  SYMPTOMS TO REPORT IMMEDIATELY:     Following upper endoscopy (EGD)  Vomiting of blood or coffee ground material  New chest pain or pain under the shoulder blades  Painful or persistently difficult swallowing  New shortness of breath  Fever of 100F or higher  Black, tarry-looking stools  For urgent or emergent issues, a gastroenterologist can be reached at any hour by calling [phone].   DIET:  We do recommend a small meal at first, but then you may proceed to your regular diet.  Drink plenty of fluids but you should avoid alcoholic beverages for 24 hours.  ACTIVITY:  You should plan to take it easy for the rest of today and you should NOT DRIVE or use  heavy machinery until tomorrow (because of the sedation medicines used during the test).    FOLLOW UP: Our staff will call the number listed on your records the next business day following your procedure to check on you and address any questions or concerns that you may have regarding the information given to you following your procedure. If we do not reach you, we will leave a message.  However, if you are feeling well and you are not experiencing any problems, there is no need to return our call.  We will assume that you have returned to your regular daily activities without incident.  If any biopsies were taken you will be contacted by phone or by letter within the next 1-3 weeks.  Please call us at [phone] if you have not heard about the biopsies in 3 weeks.    SIGNATURES/CONFIDENTIALITY: You and/or your care partner have signed paperwork which will be entered into your electronic medical record.  These signatures attest to the fact that that the information above on your After Visit Summary has been reviewed and is understood.  Full responsibility of the confidentiality of this discharge information lies with you and/or your care-partner.

## 2018-01-25 ENCOUNTER — Telehealth: Payer: Self-pay | Admitting: *Deleted

## 2018-01-25 DIAGNOSIS — M4726 Other spondylosis with radiculopathy, lumbar region: Secondary | ICD-10-CM | POA: Diagnosis not present

## 2018-01-25 DIAGNOSIS — Z79891 Long term (current) use of opiate analgesic: Secondary | ICD-10-CM | POA: Diagnosis not present

## 2018-01-25 DIAGNOSIS — G894 Chronic pain syndrome: Secondary | ICD-10-CM | POA: Diagnosis not present

## 2018-01-25 DIAGNOSIS — M961 Postlaminectomy syndrome, not elsewhere classified: Secondary | ICD-10-CM | POA: Diagnosis not present

## 2018-01-25 NOTE — Telephone Encounter (Signed)
  Follow up Call-  Call back number 01/24/2018 05/17/2017  Post procedure Call Back phone  # [phone]  Permission to leave phone message Yes Yes  Some recent data might be hidden     Patient questions:  Do you have a fever, pain , or abdominal swelling? No. Pain Score  0 *  Have you tolerated food without any problems? Yes.    Have you been able to return to your normal activities? Yes.    Do you have any questions about your discharge instructions: Diet   No. Medications  No. Follow up visit  No.  Do you have questions or concerns about your Care? No.  Actions: * If pain score is 4 or above: No action needed, pain <4.

## 2018-01-31 ENCOUNTER — Other Ambulatory Visit: Payer: Self-pay | Admitting: Gastroenterology

## 2018-01-31 NOTE — Telephone Encounter (Signed)
Ok to refill 

## 2018-02-02 NOTE — Telephone Encounter (Signed)
Armbruster gave this to her in January to try.  Never really got an update on if she was taking it and if it seemed to help as her other issues were for constipation and dysphagia at the visits when I saw her.  Please confirm that she is actually taking it and if she wants it refilled.  Thank you,  Jess

## 2018-02-05 ENCOUNTER — Ambulatory Visit (INDEPENDENT_AMBULATORY_CARE_PROVIDER_SITE_OTHER): Payer: PPO | Admitting: Physician Assistant

## 2018-02-05 ENCOUNTER — Encounter: Payer: Self-pay | Admitting: Physician Assistant

## 2018-02-05 VITALS — BP 120/76 | HR 76 | Temp 98.4°F | Resp 16 | Ht 63.0 in | Wt 157.6 lb

## 2018-02-05 DIAGNOSIS — J42 Unspecified chronic bronchitis: Secondary | ICD-10-CM | POA: Diagnosis not present

## 2018-02-05 MED ORDER — AMOXICILLIN-POT CLAVULANATE 250-125 MG PO TABS: 1.0000 | ORAL_TABLET | Freq: Two times a day (BID) | ORAL | 0 refills | Status: DC

## 2018-02-05 MED ORDER — PREDNISONE 20 MG PO TABS: ORAL_TABLET | ORAL | 0 refills | Status: DC

## 2018-02-05 MED ORDER — BENZONATATE 200 MG PO CAPS: 200.0000 mg | ORAL_CAPSULE | Freq: Three times a day (TID) | ORAL | 0 refills | Status: DC | PRN

## 2018-02-05 NOTE — Telephone Encounter (Signed)
Called and spoke to pt.  The Bentyl was helpful and she would like a refill. Script sent

## 2018-02-05 NOTE — Patient Instructions (Signed)

## 2018-02-05 NOTE — Progress Notes (Signed)
Subjective:    Patient ID: [name] [name], female    DOB: 06-Oct-1935, 82 y.o.   MRN: [id number]  HPI 82 y.o. WF with history of asthma, large HH, and GERD presents with wheezing x 2 weeks. She has seen Dr. Melvyn Novas in May. She is still on dulera twice a day, has not had to use her rescue inhaler. She has had cough with brown mucus, no fever, chills.   Blood pressure 120/76, pulse 76, temperature 98.4 F (36.9 C), resp. rate 16, height 5\' 3"  (1.6 m), weight 157 lb 9.6 oz (71.5 kg), SpO2 97 %.  Medications Current Outpatient Medications on File Prior to Visit  Medication Sig  . acetaminophen (TYLENOL) 500 MG tablet Take 1,000 mg by mouth every 6 (six) hours as needed for mild pain.  [name] albuterol (PROAIR HFA) 108 (90 Base) MCG/ACT inhaler Inhale 2 puffs into the lungs every 6 (six) hours as needed for wheezing or shortness of breath.  [name] alendronate (FOSAMAX) 70 MG tablet  TAKE 1 TABLET BY MOUTH ONCE A WEEK. TAKE WITH A FULL GLASS OF WATER ON AN EMPTY STOMACH.  [name] ALPRAZolam (XANAX) 0.5 MG tablet TAKE ONE-HALF TO ONE TABLET BY MOUTH 2 TO 3 TIMES DAILY ONLY IF ANXIETY ATTACK AND PLEASE TRY TO LIMIT TO 5 DAYS A WEEK TO AVOID ADDICTION  . amLODipine (NORVASC) 10 MG tablet TAKE 1 TABLET BY MOUTH AT BEDTIME  . aspirin EC 81 MG tablet Take 81 mg by mouth daily.  . Cholecalciferol (VITAMIN D3) 5000 units CAPS Take 5,000 Units by mouth 2 (two) times daily.   . diclofenac sodium (VOLTAREN) 1 % GEL Apply 2 g topically 4 (four) times daily.  [name] dicyclomine (BENTYL) 10 MG capsule TAKE 1 CAPSULE BY MOUTH EVERY 8 HOURS AS NEEDED FOR SPASM  . gabapentin (NEURONTIN) 300 MG capsule TAKE 1 CAPSULE BY MOUTH THREE TIMES DAILY FOR  CHRONIC  PAIN.  [name] ipratropium-albuterol (DUONEB) 0.5-2.5 (3) MG/3ML SOLN Take 3 mLs by nebulization every 6 (six) hours as needed.  [name] ketoconazole (NIZORAL) 2 % cream Apply 1 application topically 2 (two) times daily as needed for irritation.  [name] losartan (COZAAR) 100 MG tablet TAKE ONE TABLET  BY MOUTH ONCE DAILY (DISCONTINUE  BENICAR)  . lubiprostone (AMITIZA) 8 MCG capsule Take 1 capsule (8 mcg total) by mouth 2 (two) times daily with a meal.  . MAGNESIUM PO Take by mouth daily.  . meclizine (ANTIVERT) 25 MG tablet 1/2-1 pill up to 3 times daily for motion sickness/dizziness (Patient taking differently: Take 12.5-25 mg by mouth 3 (three) times daily as needed for dizziness. 1/2-1 pill up to 3 times daily for motion sickness/dizziness)  . methocarbamol (ROBAXIN) 500 MG tablet Take 1 tablet (500 mg total) by mouth every 6 (six) hours as needed for muscle spasms.  . metoprolol tartrate (LOPRESSOR) 50 MG tablet Take 50 mg by mouth at bedtime.  . mometasone-formoterol (DULERA) 100-5 MCG/ACT AERO Inhale 2 puffs into the lungs every 12 (twelve) hours. (Patient taking differently: Inhale 2 puffs into the lungs every 12 (twelve) hours as needed for wheezing or shortness of breath. )  . nitroGLYCERIN (NITROSTAT) 0.4 MG SL tablet Place 1 tablet (0.4 mg total) under the tongue every 5 (five) minutes as needed for chest pain.  [name] omeprazole (PRILOSEC) 20 MG capsule Take 1 capsule (20 mg total) by mouth daily.  . ondansetron (ZOFRAN-ODT) 8 MG disintegrating tablet DISSOLVE ONE TABLET IN MOUTH EVERY 8 HOURS AS NEEDED FOR NAUSEA AND  VOMITING  . OVER THE COUNTER MEDICATION Take 1,000 mg by mouth 2 (two) times daily. [place] Calcium 1000mg   . oxyCODONE-acetaminophen (PERCOCET/ROXICET) 5-325 MG tablet Take 1 tablet by mouth every 4 (four) hours as needed for severe pain.  . polyethylene glycol powder (GLYCOLAX/MIRALAX) powder Take 17 g by mouth 2 (two) times daily. (Patient taking differently: Take 17 g by mouth 2 (two) times daily as needed for mild constipation or moderate constipation. )  . pravastatin (PRAVACHOL) 40 MG tablet Take 1 tablet (40 mg total) by mouth daily.  . promethazine (PHENERGAN) 25 MG suppository Place 1 suppository (25 mg total) rectally every 6 (six) hours as needed for vomiting.  .  promethazine (PHENERGAN) 25 MG tablet Take 1 tablet (25 mg total) by mouth every 6 (six) hours as needed for nausea or vomiting.  . senna-docusate (SENOKOT-S) 8.6-50 MG tablet Take 2 tablets by mouth 2 (two) times daily.  . silodosin (RAPAFLO) 8 MG CAPS capsule Take 8 mg by mouth daily with breakfast.    No current facility-administered medications on file prior to visit.     Problem list She has Essential hypertension; Mild intermittent asthma without complication; ESOPHAGEAL STRICTURE; Incarcerated recurrent paraesophageal hiatal hernia s/p lap redo repair [id number]; Vertebral compression fracture ([place]); Herniated lumbar intervertebral disc; MGUS (monoclonal gammopathy of unknown significance); Constipation, chronic; Prediabetes; Vitamin D deficiency; Medication management; Osteoporosis; Coronary artery disease due to lipid rich plaque; Depression, major, in partial remission ([place]); Chronic pain syndrome; Mixed hyperlipidemia; GERD ; DDD (degenerative disc disease), lumbar; Spinal stenosis, lumbar region, with neurogenic claudication; Lumbar radiculopathy, acute; Dysphagia; Spondylolisthesis, lumbar region; Anxiety state; Costochondritis; Atherosclerosis of aorta ([place]); Chronic bronchitis ([facility]); T12 compression fracture ([place]); CKD (chronic kidney disease), stage III ([place]); Therapeutic opioid-induced constipation ([facility]); and Abnormal UGI series on their problem list.  Review of Systems  Constitutional: Negative for chills and diaphoresis.  HENT: Positive for postnasal drip, sinus pressure and sneezing. Negative for ear pain and sore throat.   Respiratory: Positive for cough and wheezing. Negative for chest tightness and shortness of breath.   Cardiovascular: Negative.   Gastrointestinal: Negative.   Genitourinary: Negative.   Musculoskeletal: Negative for neck pain.  Neurological: Positive for headaches.       Objective:   Physical Exam  Constitutional: She is oriented to person, place, and  time. She appears well-developed and well-nourished.  HENT:  Head: Normocephalic and atraumatic.  Right Ear: External ear normal.  Left Ear: External ear normal.  Nose: Nose normal.  Mouth/Throat: Oropharynx is clear and moist.  Eyes: Pupils are equal, round, and reactive to light. Conjunctivae are normal.  Neck: Normal range of motion. Neck supple.  Cardiovascular: Normal rate and regular rhythm.  Pulmonary/Chest: Effort normal. No respiratory distress. She has wheezes (diffuse). She has no rales. She exhibits no tenderness.  Abdominal: Soft. Bowel sounds are normal.  Lymphadenopathy:    She has no cervical adenopathy.  Neurological: She is alert and oriented to person, place, and time.  Skin: Skin is warm and dry.        Assessment & Plan:  Raina was seen today for acute visit, cough and wheezing.  Diagnoses and all orders for this visit:  Chronic bronchitis, unspecified chronic bronchitis type ([place]) -     predniSONE (DELTASONE) 20 MG tablet; 2 tablets daily for 3 days, 1 tablet daily for 4 days. -     amoxicillin-clavulanate (AUGMENTIN) 250-125 MG tablet; Take 1 tablet by mouth 2 (two) times daily. For 7 days. Take with food. -  benzonatate (TESSALON) 200 MG capsule; Take 1 capsule (200 mg total) by mouth 3 (three) times daily as needed for cough (Max: 600mg  per day).  Will post flu shot due to prednisone use, get flu shot in 2 weeks.

## 2018-02-06 ENCOUNTER — Other Ambulatory Visit: Payer: Self-pay | Admitting: Internal Medicine

## 2018-02-12 ENCOUNTER — Ambulatory Visit (AMBULATORY_SURGERY_CENTER): Payer: Self-pay | Admitting: *Deleted

## 2018-02-12 ENCOUNTER — Encounter: Payer: Self-pay | Admitting: Gastroenterology

## 2018-02-12 VITALS — Ht 64.0 in | Wt 158.0 lb

## 2018-02-12 DIAGNOSIS — R1310 Dysphagia, unspecified: Secondary | ICD-10-CM

## 2018-02-12 NOTE — Progress Notes (Signed)
Patient denies any allergies to eggs or soy. Patient denies any problems with anesthesia/sedation. Patient denies any oxygen use at home. Patient denies taking any diet/weight loss medications or blood thinners.  

## 2018-02-16 ENCOUNTER — Telehealth: Payer: Self-pay | Admitting: Rheumatology

## 2018-02-16 MED ORDER — ALENDRONATE SODIUM 70 MG PO TABS: ORAL_TABLET | ORAL | 0 refills | Status: DC

## 2018-02-16 NOTE — Telephone Encounter (Signed)
Patient needs a refill on her generic Fosamax sent to [place] at [facility].

## 2018-02-16 NOTE — Telephone Encounter (Signed)
Last Visit: 11/14/17 Next Visit: 05/17/18 Labs: 11/23/17 cbc/cmp wnl  Okay to refill per Dr. Estanislado Pandy

## 2018-02-19 ENCOUNTER — Telehealth: Payer: Self-pay | Admitting: Gastroenterology

## 2018-02-19 NOTE — Telephone Encounter (Signed)
Pt is concerned that she will get nauseous if she takes her pain pill on an empty stomach.  I advised her to try to take it with broth, jello or a popsicle.  She is due to take oa pain pill at 1230 and will call back if she has problems after taking it.

## 2018-02-20 ENCOUNTER — Other Ambulatory Visit (HOSPITAL_COMMUNITY)
Admission: RE | Admit: 2018-02-20 | Discharge: 2018-02-20 | Disposition: A | Discharge status: Home / Self Care | Payer: PPO | Source: Ambulatory Visit | Attending: Gastroenterology | Admitting: Gastroenterology

## 2018-02-20 ENCOUNTER — Encounter: Payer: Self-pay | Admitting: Gastroenterology

## 2018-02-20 ENCOUNTER — Ambulatory Visit (AMBULATORY_SURGERY_CENTER): Payer: PPO | Admitting: Gastroenterology

## 2018-02-20 VITALS — BP 158/84 | HR 72 | Temp 98.6°F | Resp 21 | Ht 63.0 in | Wt 157.0 lb

## 2018-02-20 DIAGNOSIS — Z8673 Personal history of transient ischemic attack (TIA), and cerebral infarction without residual deficits: Secondary | ICD-10-CM | POA: Diagnosis not present

## 2018-02-20 DIAGNOSIS — K259 Gastric ulcer, unspecified as acute or chronic, without hemorrhage or perforation: Secondary | ICD-10-CM | POA: Diagnosis not present

## 2018-02-20 DIAGNOSIS — R1310 Dysphagia, unspecified: Secondary | ICD-10-CM

## 2018-02-20 DIAGNOSIS — K3189 Other diseases of stomach and duodenum: Secondary | ICD-10-CM | POA: Diagnosis not present

## 2018-02-20 DIAGNOSIS — I2510 Atherosclerotic heart disease of native coronary artery without angina pectoris: Secondary | ICD-10-CM | POA: Diagnosis not present

## 2018-02-20 DIAGNOSIS — B379 Candidiasis, unspecified: Secondary | ICD-10-CM | POA: Diagnosis not present

## 2018-02-20 MED ORDER — SODIUM CHLORIDE 0.9 % IV SOLN: 500.0000 mL | Freq: Once | INTRAVENOUS | Status: DC

## 2018-02-20 NOTE — Progress Notes (Signed)
Pt's states no medical or surgical changes since previsit or office visit. 

## 2018-02-20 NOTE — Progress Notes (Signed)
Called to room to assist during endoscopic procedure.  Patient ID and intended procedure confirmed with present staff. Received instructions for my participation in the procedure from the performing physician.  

## 2018-02-20 NOTE — Op Note (Signed)
[facility] Patient Name: [name] [name] Procedure Date: 02/20/2018 10:30 AM MRN: [id number] Endoscopist: Remo Lipps P. Havery Moros , MD Age: 82 Referring MD:  Date of Birth: 12-Mar-1936 Gender: Female Account #: [id number] Procedure:                Upper GI endoscopy Indications:              Dysphagia, history of Nissen fundoplication,                            abnormal barium study with pill catching up at [facility] Medicines:                Monitored Anesthesia Care Procedure:                Pre-Anesthesia Assessment:                           - Prior to the procedure, a History and Physical                            was performed, and patient medications and                            allergies were reviewed. The patient's tolerance of                            previous anesthesia was also reviewed. The risks                            and benefits of the procedure and the sedation                            options and risks were discussed with the patient.                            All questions were answered, and informed consent                            was obtained. Prior Anticoagulants: The patient has                            taken no previous anticoagulant or antiplatelet                            agents. ASA Grade Assessment: III - A patient with                            severe systemic disease. After reviewing the risks                            and benefits, the patient was deemed in                            satisfactory condition to undergo the procedure.  After obtaining informed consent, the endoscope was                            passed under direct vision. Throughout the                            procedure, the patient's blood pressure, pulse, and                            oxygen saturations were monitored continuously. The                            Endoscope was introduced through the mouth, and   advanced to the second part of duodenum. The upper                            GI endoscopy was accomplished without difficulty.                            The patient tolerated the procedure well. Scope In: Scope Out: Findings:                 Esophagogastric landmarks were identified: the                            Z-line was found at 36 cm.                           The lower esophagus was tortuous with an angulated                            turn into the fundoplication. No obvious stenosis                            was appreciated.                           A TTS dilator was passed through the scope.                            Dilation with a 16-17-18 mm balloon dilator was                            performed to 16 mm, 17 mm and 18 mm at the                            gastroesophageal junction without any appreciable                            mucosal wrent.                           Multiple diminutive white plaques were found in the  lower third of the esophagus. Brushings for KOH                            prep were obtained.                           The exam of the esophagus was otherwise normal.                           Evidence of a Nissen fundoplication was found in                            the cardia, with a retained suture visible. The                            wrap was intact.                           Patchy inflammation characterized by erythema was                            found in the gastric antrum. Biopsies were taken                            with a cold forceps for histology.                           The exam of the stomach was otherwise normal.                           The duodenal bulb and second portion of the                            duodenum were normal. Complications:            No immediate complications. Estimated blood loss:                            Minimal. Estimated Blood Loss:     Estimated blood loss was  minimal. Impression:               - Esophagogastric landmarks identified.                           - Tortuous esophagus with angulated turn into the                            fundoplication without obvious stenosis - I suspect                            this is likely causing the patient's symptoms and                            barium study findings, likely due to altered  anatomy from fundoplication. Balloon dilation                            performed to the area to 60mm, no mucosal wrents                            appreciated.                           - Multiple plaques in the lower third of the                            esophagus. Brushings performed to rule out                            Candidiasis.                           - A Nissen fundoplication was found, the wrap was                            intact.                           - Gastritis. Biopsied.                           - Normal duodenal bulb and second portion of the                            duodenum.                           . Recommendation:           - Patient has a contact number available for                            emergencies. The signs and symptoms of potential                            delayed complications were discussed with the                            patient. Return to normal activities tomorrow.                            Written discharge instructions were provided to the                            patient.                           - Resume previous diet.                           - Continue present medications.                           -  Await pathology results.                           - Await course post dilation Steven P. Armbruster, MD 02/20/2018 10:55:05 AM This report has been signed electronically.

## 2018-02-20 NOTE — Patient Instructions (Signed)
YOU HAD AN ENDOSCOPIC PROCEDURE TODAY AT THE [facility]:   Refer to the procedure report that was given to you for any specific questions about what was found during the examination.  If the procedure report does not answer your questions, please call your gastroenterologist to clarify.  If you requested that your care partner not be given the details of your procedure findings, then the procedure report has been included in a sealed envelope for you to review at your convenience later.  YOU SHOULD EXPECT: Some feelings of bloating in the abdomen. Passage of more gas than usual.  Walking can help get rid of the air that was put into your GI tract during the procedure and reduce the bloating. If you had a lower endoscopy (such as a colonoscopy or flexible sigmoidoscopy) you may notice spotting of blood in your stool or on the toilet paper. If you underwent a bowel prep for your procedure, you may not have a normal bowel movement for a few days.  Please Note:  You might notice some irritation and congestion in your nose or some drainage.  This is from the oxygen used during your procedure.  There is no need for concern and it should clear up in a day or so.  SYMPTOMS TO REPORT IMMEDIATELY:   Following upper endoscopy (EGD)  Vomiting of blood or coffee ground material  New chest pain or pain under the shoulder blades  Painful or persistently difficult swallowing  New shortness of breath  Fever of 100F or higher  Black, tarry-looking stools  For urgent or emergent issues, a gastroenterologist can be reached at any hour by calling [phone].   DIET:  We do recommend a small meal at first, but then you may proceed to your regular diet.  Drink plenty of fluids but you should avoid alcoholic beverages for 24 hours.  MEDICATIONS: Continue present medications.   Please see handouts given to you by your recovery nurse.  ACTIVITY:  You should plan to take it easy for the rest of  today and you should NOT DRIVE or use heavy machinery until tomorrow (because of the sedation medicines used during the test).    FOLLOW UP: Our staff will call the number listed on your records the next business day following your procedure to check on you and address any questions or concerns that you may have regarding the information given to you following your procedure. If we do not reach you, we will leave a message.  However, if you are feeling well and you are not experiencing any problems, there is no need to return our call.  We will assume that you have returned to your regular daily activities without incident.  If any biopsies were taken you will be contacted by phone or by letter within the next 1-3 weeks.  Please call us at [phone] if you have not heard about the biopsies in 3 weeks.   Thank you for allowing us to provide for your healthcare needs today.  SIGNATURES/CONFIDENTIALITY: You and/or your care partner have signed paperwork which will be entered into your electronic medical record.  These signatures attest to the fact that that the information above on your After Visit Summary has been reviewed and is understood.  Full responsibility of the confidentiality of this discharge information lies with you and/or your care-partner. 

## 2018-02-20 NOTE — Progress Notes (Signed)
Report given to PACU, vss 

## 2018-02-21 ENCOUNTER — Telehealth: Payer: Self-pay | Admitting: *Deleted

## 2018-02-21 NOTE — Telephone Encounter (Signed)
  Follow up Call-  Call back number 02/20/2018 01/24/2018 05/17/2017  Post procedure Call Back phone  # [phone]  Permission to leave phone message Yes Yes Yes  Some recent data might be hidden     Patient questions:  Do you have a fever, pain , or abdominal swelling? No. Pain Score  0 *  Have you tolerated food without any problems? Yes.    Have you been able to return to your normal activities? Yes.    Do you have any questions about your discharge instructions: Diet   No. Medications  No. Follow up visit  No.  Do you have questions or concerns about your Care? No.  Actions: * If pain score is 4 or above: No action needed, pain <4.

## 2018-02-22 DIAGNOSIS — G894 Chronic pain syndrome: Secondary | ICD-10-CM | POA: Diagnosis not present

## 2018-02-22 DIAGNOSIS — Z79891 Long term (current) use of opiate analgesic: Secondary | ICD-10-CM | POA: Diagnosis not present

## 2018-02-22 DIAGNOSIS — M961 Postlaminectomy syndrome, not elsewhere classified: Secondary | ICD-10-CM | POA: Diagnosis not present

## 2018-02-22 DIAGNOSIS — M4726 Other spondylosis with radiculopathy, lumbar region: Secondary | ICD-10-CM | POA: Diagnosis not present

## 2018-03-01 ENCOUNTER — Other Ambulatory Visit: Payer: Self-pay

## 2018-03-01 ENCOUNTER — Encounter: Payer: Self-pay | Admitting: Internal Medicine

## 2018-03-01 DIAGNOSIS — Z8249 Family history of ischemic heart disease and other diseases of the circulatory system: Secondary | ICD-10-CM | POA: Insufficient documentation

## 2018-03-01 DIAGNOSIS — R7309 Other abnormal glucose: Secondary | ICD-10-CM | POA: Insufficient documentation

## 2018-03-01 MED ORDER — FLUCONAZOLE 200 MG PO TABS: ORAL_TABLET | ORAL | 0 refills | Status: DC

## 2018-03-01 NOTE — Patient Instructions (Signed)

## 2018-03-01 NOTE — Progress Notes (Signed)
This very nice 82 y.o. MWF presents for 6 month follow up with HTN, HLD, Pre-Diabetes and Vitamin D Deficiency.      Patient has hx/o repair of a large paraesophageal hernia in 2013 & 2014.      Patient has hx/o L3/L4 Kyphoplasty in 2014 and hx/o prior back surg x 3 by Dr Louanne Skye and is followed by Dr Nicholaus Bloom for Chronic Pain Management. In May she had Kyphoplasty for a T12 Compression Fx.      Patient is treated for HTN circa 1986 w/CKD3  & BP has been controlled at home. Today's BP I at goal - 118/74. She underwent PCA/Stent in 2003 for ACS. In 2013, Stress Myoview was negative.  Patient has had no complaints of any cardiac type chest pain, palpitations, dyspnea / orthopnea / PND, dizziness, claudication, or dependent edema.     Hyperlipidemia is controlled with diet & meds. Patient denies myalgias or other med SE's. Last Lipids were at goal: Lab Results  Component Value Date   CHOL 137 11/30/2017   HDL 69 11/30/2017   LDLCALC 47 11/30/2017   TRIG 126 11/30/2017   CHOLHDL 2.0 11/30/2017      Also, the patient has history of PreDiabetes (A1c 6.1% / 2013)   and has had no symptoms of reactive hypoglycemia, diabetic polys, paresthesias or visual blurring.  Last A1c was near goal: Lab Results  Component Value Date   HGBA1C 5.8 (H) 11/30/2017      Further, the patient also has history of Vitamin D Deficiency("25" / 2008)  and supplements vitamin D without any suspected side-effects. Last vitamin D was low:  Lab Results  Component Value Date   VD25OH 48 07/25/2017   Current Outpatient Medications on File Prior to Visit  Medication Sig  . acetaminophen (TYLENOL) 500 MG tablet Take 1,000 mg by mouth every 6 (six) hours as needed for mild pain.  [name] albuterol (PROAIR HFA) 108 (90 Base) MCG/ACT inhaler Inhale 2 puffs into the lungs every 6 (six) hours as needed for wheezing or shortness of breath.  [name] alendronate (FOSAMAX) 70 MG tablet Take with a full glass of water on an empty  stomach.  . ALPRAZolam (XANAX) 0.5 MG tablet TAKE ONE-HALF TO ONE TABLET BY MOUTH 2 TO 3 TIMES DAILY ONLY IF ANXIETY ATTACK AND PLEASE TRY TO LIMIT TO 5 DAYS A WEEK TO AVOID ADDICTION  . amLODipine (NORVASC) 10 MG tablet TAKE 1 TABLET BY MOUTH AT BEDTIME  . aspirin EC 81 MG tablet Take 81 mg by mouth daily.  . Cholecalciferol (VITAMIN D3) 5000 units CAPS Take 5,000 Units by mouth 2 (two) times daily.   . diclofenac sodium (VOLTAREN) 1 % GEL Apply 2 g topically 4 (four) times daily.  [name] dicyclomine (BENTYL) 10 MG capsule TAKE 1 CAPSULE BY MOUTH EVERY 8 HOURS AS NEEDED FOR SPASM  . fluconazole (DIFLUCAN) 200 MG tablet Take 2 pills by mouth day one, then take 1 tablet daily for 13 days.  [name] gabapentin (NEURONTIN) 300 MG capsule TAKE 1 CAPSULE BY MOUTH THREE TIMES DAILY FOR  CHRONIC  PAIN.  [name] ipratropium-albuterol (DUONEB) 0.5-2.5 (3) MG/3ML SOLN Take 3 mLs by nebulization every 6 (six) hours as needed.  [name] ketoconazole (NIZORAL) 2 % cream Apply 1 application topically 2 (two) times daily as needed for irritation.  [name] losartan (COZAAR) 100 MG tablet TAKE 1 TABLET BY MOUTH ONCE DAILY (DISCONTINUE  BENICAR)  . lubiprostone (AMITIZA) 8 MCG capsule Take 1 capsule (  8 mcg total) by mouth 2 (two) times daily with a meal.  . MAGNESIUM PO Take by mouth daily.  . meclizine (ANTIVERT) 25 MG tablet 1/2-1 pill up to 3 times daily for motion sickness/dizziness (Patient taking differently: Take 12.5-25 mg by mouth 3 (three) times daily as needed for dizziness. 1/2-1 pill up to 3 times daily for motion sickness/dizziness)  . methocarbamol (ROBAXIN) 500 MG tablet Take 1 tablet (500 mg total) by mouth every 6 (six) hours as needed for muscle spasms.  . metoprolol tartrate (LOPRESSOR) 50 MG tablet Take 50 mg by mouth at bedtime.  . mometasone-formoterol (DULERA) 100-5 MCG/ACT AERO Inhale 2 puffs into the lungs every 12 (twelve) hours. (Patient taking differently: Inhale 2 puffs into the lungs every 12 (twelve) hours as needed  for wheezing or shortness of breath. )  . nitroGLYCERIN (NITROSTAT) 0.4 MG SL tablet Place 1 tablet (0.4 mg total) under the tongue every 5 (five) minutes as needed for chest pain.  [name] omeprazole (PRILOSEC) 20 MG capsule Take 1 capsule (20 mg total) by mouth daily.  . ondansetron (ZOFRAN-ODT) 8 MG disintegrating tablet DISSOLVE ONE TABLET IN MOUTH EVERY 8 HOURS AS NEEDED FOR NAUSEA AND  VOMITING  . OVER THE COUNTER MEDICATION Take 1,000 mg by mouth 2 (two) times daily. [place] Calcium 1000mg   . oxyCODONE-acetaminophen (PERCOCET/ROXICET) 5-325 MG tablet Take 1 tablet by mouth every 4 (four) hours as needed for severe pain.  . polyethylene glycol powder (GLYCOLAX/MIRALAX) powder Take 17 g by mouth 2 (two) times daily. (Patient taking differently: Take 17 g by mouth 2 (two) times daily as needed for mild constipation or moderate constipation. )  . pravastatin (PRAVACHOL) 40 MG tablet Take 1 tablet (40 mg total) by mouth daily.  . promethazine (PHENERGAN) 25 MG tablet Take 1 tablet (25 mg total) by mouth every 6 (six) hours as needed for nausea or vomiting.  . senna-docusate (SENOKOT-S) 8.6-50 MG tablet Take 2 tablets by mouth 2 (two) times daily.  . silodosin (RAPAFLO) 8 MG CAPS capsule Take 8 mg by mouth daily with breakfast.    Current Facility-Administered Medications on File Prior to Visit  Medication  . 0.9 %  sodium chloride infusion   Allergies  Allergen Reactions  . Ace Inhibitors Cough    Per Dr Melvyn Novas pulmonology 2013  . Aspartame Diarrhea and Nausea And Vomiting  . Atorvastatin Other (See Comments)    Muscle aches  . Biaxin [Clarithromycin] Other (See Comments)    PATIENT CAN TOLERATE Z-PAK.  Is written on patient's paper chart.  [name] [Roflumilast] Nausea And Vomiting  . Erythromycin Other (See Comments)    PATIENT CAN TOLERATE Z-PAK.  It is written on patient's paper chart.  . Levofloxacin Nausea And Vomiting   PMHx:   Past Medical History:  Diagnosis Date  . Anxiety     takes Xanax daily as needed  . Asthma    Albuterol daily as needed  . Blood transfusion without reported diagnosis   . CAD (coronary artery disease)    LHC 2003 with 40% pLAD, 30% mLAD, 100% D1 (moderate vessel), 100%  D2 (small vessel), 95% mid small OM2.  Pt had PTCA to D1;  D2 and OM2 small vessels and tx medically;  Last Myoview (2012): anterior infarct seen with scar, no ischemia. EF normal. Med Rx // Myoview (12/14):  Low risk; ant scar w/ peri-infarct ischemia; EF 55% with ant AK // Myoview 5/16: EF 55-65, ant, apical scar, no ischemia   . Cataract   .  Chronic back pain    HNP, DDD, spinal stenosis, vertebral compression fractures.   . Chronic nausea    takes Zofran daily as needed.  normal gastric emptying study 03/2013  . CKD (chronic kidney disease), stage III ([place]) 09/27/2017  . Constipation    felt to be functional. takes Senokot and Miralax daily.  treated with Linzess in past.   . DIVERTICULOSIS, COLON 08/28/2008   Qualifier: Diagnosis of  By: [name], [name], [name]    . Elevated LFTs 2015   probably med induced DILI, from Augmentin vs Pravastatin  . GERD (gastroesophageal reflux disease)    hx of esophageal stricture   . Hiatal hernia    Paraesophageal hernias. s/p fundoplications in 5284 and 04/2013  . History of bronchitis several of yrs ago  . History of colon polyps 03/2009, 03/2011   adenomatous colon polyps, no high grade dysplasia.   [name] History of vertigo    no meds  . Hyperlipidemia    takes Pravastatin daily  . Hypertension    takes Amlodipine,Metoprolol,and Losartan daily  . Nausea 03/2016  . Osteoporosis   . PONV (postoperative nausea and vomiting)    yrs ago  . Slow urinary stream    takes Rapaflo daily  . TIA (transient ischemic attack) 1995   no residual effects noted   Immunization History  Administered Date(s) Administered  . DT 05/06/2014  . Influenza Split 02/07/2011  . Influenza Whole 02/09/2009, 01/19/2010, 02/13/2012  . Influenza, High Dose  Seasonal PF 03/22/2013, 01/15/2014, 02/27/2015, 01/14/2016, 01/18/2017  . Pneumococcal Conjugate-13 05/06/2014  . Pneumococcal Polysaccharide-23 02/09/2011  . Pneumococcal-Unspecified 02/06/2010, 02/09/2012  . Td 05/10/2003   Past Surgical History:  Procedure Laterality Date  . APPENDECTOMY     patient unsure of date  . BACK SURGERY    . BLADDER REPAIR    . CHOLECYSTECTOMY     Patient unsure of date.  . CORONARY ANGIOPLASTY  11/16/2001   1 stent  . ESOPHAGEAL MANOMETRY N/A 03/25/2013   Procedure: ESOPHAGEAL MANOMETRY (EM);  Surgeon: Inda Castle, MD;  Location: [facility];  Service: Endoscopy;  Laterality: N/A;  . ESOPHAGOGASTRODUODENOSCOPY N/A 03/15/2013   Procedure: ESOPHAGOGASTRODUODENOSCOPY (EGD);  Surgeon: Jerene Bears, MD;  Location: [facility] ENDOSCOPY;  Service: Endoscopy;  Laterality: N/A;  . ESOPHAGOGASTRODUODENOSCOPY N/A 12/25/2015   Procedure: ESOPHAGOGASTRODUODENOSCOPY (EGD);  Surgeon: Doran Stabler, MD;  Location: [facility] ENDOSCOPY;  Service: Endoscopy;  Laterality: N/A;  . EYE SURGERY  11/2000   bilateral cataracts with lens implant  . FIXATION KYPHOPLASTY LUMBAR SPINE  X 2   "L3-4"  . HEMORRHOID SURGERY    . HIATAL HERNIA REPAIR  06/03/2011   Procedure: LAPAROSCOPIC REPAIR OF HIATAL HERNIA;  Surgeon: Adin Hector, MD;  Location: [facility] OR[facility];  Service: General;  Laterality: N/A;  . HIATAL HERNIA REPAIR N/A 04/24/2013   Procedure: LAPAROSCOPIC  REPAIR RECURRENT PARASOPHAGEAL HIATAL HERNIA WITH FUNDOPLICATION;  Surgeon: Adin Hector, MD;  Location: [facility];  Service: General;  Laterality: N/A;  . INSERTION OF MESH N/A 04/24/2013   Procedure: INSERTION OF MESH;  Surgeon: Adin Hector, MD;  Location: [facility];  Service: General;  Laterality: N/A;  . IR KYPHO THORACIC WITH BONE BIOPSY  09/29/2017  . IR KYPHO THORACIC WITH BONE BIOPSY  11/03/2017  . LAPAROSCOPIC LYSIS OF ADHESIONS N/A 04/24/2013   Procedure: LAPAROSCOPIC LYSIS OF ADHESIONS;  Surgeon: Adin Hector, MD;   Location: [facility];  Service: General;  Laterality: N/A;  . LAPAROSCOPIC NISSEN FUNDOPLICATION  1/32/4401  Procedure: LAPAROSCOPIC NISSEN FUNDOPLICATION;  Surgeon: Adin Hector, MD;  Location: [facility];  Service: General;  Laterality: N/A;  . LUMBAR FUSION  12/07/2015   Right L2-3 facetectomy, posterolateral fusion L2-3 with pedicle screws, rods, local bone graft, Vivigen cancellous chips. Fusion extended to the L1 level with pedicle screws and rods  . LUMBAR LAMINECTOMY/DECOMPRESSION MICRODISCECTOMY Right 06/26/2012   Procedure: LUMBAR LAMINECTOMY/DECOMPRESSION MICRODISCECTOMY;  Surgeon: Jessy Oto, MD;  Location: [place];  Service: Orthopedics;  Laterality: Right;  RIGHT L4-5 MICRODISCECTOMY  . LUMBAR LAMINECTOMY/DECOMPRESSION MICRODISCECTOMY N/A 05/29/2015   Procedure: RIGHT L2-3 MICRODISCECTOMY;  Surgeon: Jessy Oto, MD;  Location: [place];  Service: Orthopedics;  Laterality: N/A;  . TOTAL ABDOMINAL HYSTERECTOMY  1975   partial  . UPPER GASTROINTESTINAL ENDOSCOPY     FHx:    Reviewed / unchanged  SHx:    Reviewed / unchanged   Systems Review:  Constitutional: Denies fever, chills, wt changes, headaches, insomnia, fatigue, night sweats, change in appetite. Eyes: Denies redness, blurred vision, diplopia, discharge, itchy, watery eyes.  ENT: Denies discharge, congestion, post nasal drip, epistaxis, sore throat, earache, hearing loss, dental pain, tinnitus, vertigo, sinus pain, snoring.  CV: Denies chest pain, palpitations, irregular heartbeat, syncope, dyspnea, diaphoresis, orthopnea, PND, claudication or edema. Respiratory: denies cough, dyspnea, DOE, pleurisy, hoarseness, laryngitis, wheezing.  Gastrointestinal: Denies dysphagia, odynophagia, heartburn, reflux, water brash, abdominal pain or cramps, nausea, vomiting, bloating, diarrhea, constipation, hematemesis, melena, hematochezia  or hemorrhoids. Genitourinary: Denies dysuria, frequency, urgency, nocturia, hesitancy, discharge,  hematuria or flank pain. Musculoskeletal: Denies arthralgias, myalgias, stiffness, jt. swelling, pain, limping or strain/sprain.  Skin: Denies pruritus, rash, hives, warts, acne, eczema or change in skin lesion(s). Neuro: No weakness, tremor, incoordination, spasms, paresthesia or pain. Psychiatric: Denies confusion, memory loss or sensory loss. Endo: Denies change in weight, skin or hair change.  Heme/Lymph: No excessive bleeding, bruising or enlarged lymph nodes.  Physical Exam  BP 118/74   Pulse 80   Temp (!) 97.4 F (36.3 C)   Resp 16   Ht 5\' 3"  (1.6 m)   Wt 158 lb 9.6 oz (71.9 kg)   BMI 28.09 kg/m   Appears  well nourished, well groomed  and in no distress.  Eyes: PERRLA, EOMs, conjunctiva no swelling or erythema. Sinuses: No frontal/maxillary tenderness ENT/Mouth: EAC's clear, TM's nl w/o erythema, bulging. Nares clear w/o erythema, swelling, exudates. Oropharynx clear without erythema or exudates. Oral hygiene is good. Tongue normal, non obstructing. Hearing intact.  Neck: Supple. Thyroid not palpable. Car 2+/2+ without bruits, nodes or JVD. Chest: Respirations nl with BS clear & equal w/o rales, rhonchi, wheezing or stridor.  Cor: Heart sounds normal w/ regular rate and rhythm without sig. murmurs, gallops, clicks or rubs. Peripheral pulses normal and equal  without edema.  Abdomen: Soft & bowel sounds normal. Non-tender w/o guarding, rebound, hernias, masses or organomegaly.  Lymphatics: Unremarkable.  Musculoskeletal: Full ROM all peripheral extremities, joint stability, 5/5 strength and normal gait.  Skin: Warm, dry without exposed rashes, lesions or ecchymosis apparent.  Neuro: Cranial nerves intact, reflexes equal bilaterally. Sensory-motor testing grossly intact. Tendon reflexes grossly intact.  Pysch: Alert & oriented x 3.  Insight and judgement nl & appropriate. No ideations.  Assessment and Plan:  1. Essential hypertension  - Continue medication, monitor blood  pressure at home.  - Continue DASH diet.  Reminder to go to the ER if any CP,  SOB, nausea, dizziness, severe HA, changes vision/speech.  - CBC with Differential/Platelet - COMPLETE METABOLIC PANEL  WITH GFR - Magnesium - TSH  2. Hyperlipidemia, mixed  - Continue diet/meds, exercise,& lifestyle modifications.  - Continue monitor periodic cholesterol/liver & renal functions   - Lipid panel - TSH  3. Abnormal glucose  - Continue diet, exercise,  - lifestyle modifications.  - Monitor appropriate labs.  - Hemoglobin A1c - Insulin, random  4. Vitamin D deficiency  - Continue supplementation.  - VITAMIN D 25 Hydroxyl  5. Prediabetes  - Hemoglobin A1c - Insulin, random  6. Gastroesophageal reflux disease  - CBC with Differential/Platelet  7. Coronary artery disease due to lipid rich plaque  - Lipid panel  8. Chronic pain syndrome  9. CKD (chronic kidney disease) stage 3, GFR 30-59 ml/min ([facility]) - COMPLETE METABOLIC PANEL WITH GFR  10. Medication management  - CBC with Differential/Platelet - COMPLETE METABOLIC PANEL WITH GFR - Magnesium - Lipid panel - TSH - Hemoglobin A1c - Insulin, random - VITAMIN D 25 Hydroxyl  11. Iron deficiency anemia  - Iron,Total/Total Iron Binding Cap; Future  12. Restless legs  - Iron,Total/Total Iron Binding Cap; Future          Discussed  regular exercise, BP monitoring, weight control to achieve/maintain BMI less than 25 and discussed med and SE's. Recommended labs to assess and monitor clinical status with further disposition pending results of labs. Over 30 minutes of exam, counseling, chart review was performed.

## 2018-03-02 ENCOUNTER — Ambulatory Visit (INDEPENDENT_AMBULATORY_CARE_PROVIDER_SITE_OTHER): Payer: PPO | Admitting: Internal Medicine

## 2018-03-02 VITALS — BP 118/74 | HR 80 | Temp 97.4°F | Resp 16 | Ht 63.0 in | Wt 158.6 lb

## 2018-03-02 DIAGNOSIS — E782 Mixed hyperlipidemia: Secondary | ICD-10-CM

## 2018-03-02 DIAGNOSIS — G2581 Restless legs syndrome: Secondary | ICD-10-CM

## 2018-03-02 DIAGNOSIS — I2583 Coronary atherosclerosis due to lipid rich plaque: Secondary | ICD-10-CM

## 2018-03-02 DIAGNOSIS — R7303 Prediabetes: Secondary | ICD-10-CM

## 2018-03-02 DIAGNOSIS — R7309 Other abnormal glucose: Secondary | ICD-10-CM | POA: Diagnosis not present

## 2018-03-02 DIAGNOSIS — N183 Chronic kidney disease, stage 3 unspecified: Secondary | ICD-10-CM

## 2018-03-02 DIAGNOSIS — E559 Vitamin D deficiency, unspecified: Secondary | ICD-10-CM | POA: Diagnosis not present

## 2018-03-02 DIAGNOSIS — K219 Gastro-esophageal reflux disease without esophagitis: Secondary | ICD-10-CM

## 2018-03-02 DIAGNOSIS — I2510 Atherosclerotic heart disease of native coronary artery without angina pectoris: Secondary | ICD-10-CM | POA: Diagnosis not present

## 2018-03-02 DIAGNOSIS — G894 Chronic pain syndrome: Secondary | ICD-10-CM | POA: Diagnosis not present

## 2018-03-02 DIAGNOSIS — D509 Iron deficiency anemia, unspecified: Secondary | ICD-10-CM | POA: Diagnosis not present

## 2018-03-02 DIAGNOSIS — Z79899 Other long term (current) drug therapy: Secondary | ICD-10-CM

## 2018-03-02 DIAGNOSIS — I10 Essential (primary) hypertension: Secondary | ICD-10-CM

## 2018-03-04 ENCOUNTER — Encounter: Payer: Self-pay | Admitting: Internal Medicine

## 2018-03-04 ENCOUNTER — Other Ambulatory Visit (INDEPENDENT_AMBULATORY_CARE_PROVIDER_SITE_OTHER): Payer: Self-pay | Admitting: Specialist

## 2018-03-05 LAB — HEMOGLOBIN A1C
HEMOGLOBIN A1C: 6 %{Hb} — AB (ref <5.7)
Mean Plasma Glucose: 126 (calc)
eAG (mmol/L): 7 (calc)

## 2018-03-05 LAB — COMPLETE METABOLIC PANEL WITH GFR
AG Ratio: 1.7 (calc) (ref 1.0–2.5)
ALBUMIN MSPROF: 4.3 g/dL (ref 3.6–5.1)
ALT: 15 U/L (ref 6–29)
AST: 15 U/L (ref 10–35)
Alkaline phosphatase (APISO): 61 U/L (ref 33–130)
BILIRUBIN TOTAL: 0.5 mg/dL (ref 0.2–1.2)
BUN / CREAT RATIO: 21 (calc) (ref 6–22)
BUN: 20 mg/dL (ref 7–25)
CO2: 25 mmol/L (ref 20–32)
CREATININE: 0.97 mg/dL — AB (ref 0.60–0.88)
Calcium: 9.2 mg/dL (ref 8.6–10.4)
Chloride: 105 mmol/L (ref 98–110)
GFR, EST AFRICAN AMERICAN: 64 mL/min/{1.73_m2} (ref >=60)
GFR, Est Non African American: 55 mL/min/{1.73_m2} — ABNORMAL LOW (ref >=60)
GLUCOSE: 180 mg/dL — AB (ref 65–99)
Globulin: 2.5 g/dL (calc) (ref 1.9–3.7)
Potassium: 4.6 mmol/L (ref 3.5–5.3)
Sodium: 140 mmol/L (ref 135–146)
TOTAL PROTEIN: 6.8 g/dL (ref 6.1–8.1)

## 2018-03-05 LAB — LIPID PANEL
CHOLESTEROL: 131 mg/dL (ref <200)
HDL: 65 mg/dL (ref >50)
LDL Cholesterol (Calc): 46 mg/dL (calc)
Non-HDL Cholesterol (Calc): 66 mg/dL (calc) (ref <130)
Total CHOL/HDL Ratio: 2 (calc) (ref <5.0)
Triglycerides: 116 mg/dL (ref <150)

## 2018-03-05 LAB — CBC WITH DIFFERENTIAL/PLATELET
BASOS ABS: 60 {cells}/uL (ref 0–200)
Basophils Relative: 1 %
EOS ABS: 432 {cells}/uL (ref 15–500)
EOS PCT: 7.2 %
HCT: 44.4 % (ref 35.0–45.0)
Hemoglobin: 14.8 g/dL (ref 11.7–15.5)
Lymphs Abs: 1782 cells/uL (ref 850–3900)
MCH: 31.7 pg (ref 27.0–33.0)
MCHC: 33.3 g/dL (ref 32.0–36.0)
MCV: 95.1 fL (ref 80.0–100.0)
MONOS PCT: 7.7 %
MPV: 10.4 fL (ref 7.5–12.5)
NEUTROS PCT: 54.4 %
Neutro Abs: 3264 cells/uL (ref 1500–7800)
Platelets: 319 10*3/uL (ref 140–400)
RBC: 4.67 10*6/uL (ref 3.80–5.10)
RDW: 13.1 % (ref 11.0–15.0)
Total Lymphocyte: 29.7 %
WBC mixed population: 462 cells/uL (ref 200–950)
WBC: 6 10*3/uL (ref 3.8–10.8)

## 2018-03-05 LAB — IRON, TOTAL/TOTAL IRON BINDING CAP
%SAT: 21 % (calc) (ref 16–45)
IRON: 74 ug/dL (ref 45–160)
TIBC: 354 ug/dL (ref 250–450)

## 2018-03-05 LAB — TSH: TSH: 0.88 m[IU]/L (ref 0.40–4.50)

## 2018-03-05 LAB — VITAMIN D 25 HYDROXY (VIT D DEFICIENCY, FRACTURES): VIT D 25 HYDROXY: 74 ng/mL (ref 30–100)

## 2018-03-05 LAB — INSULIN, RANDOM: INSULIN: 118.9 u[IU]/mL — AB (ref 2.0–19.6)

## 2018-03-05 LAB — MAGNESIUM: MAGNESIUM: 2 mg/dL (ref 1.5–2.5)

## 2018-03-05 NOTE — Telephone Encounter (Signed)
Orphenadrine refill request

## 2018-03-09 DIAGNOSIS — R3912 Poor urinary stream: Secondary | ICD-10-CM | POA: Diagnosis not present

## 2018-03-09 DIAGNOSIS — R351 Nocturia: Secondary | ICD-10-CM | POA: Diagnosis not present

## 2018-03-14 ENCOUNTER — Other Ambulatory Visit: Payer: Self-pay | Admitting: Internal Medicine

## 2018-03-16 ENCOUNTER — Telehealth: Payer: Self-pay | Admitting: Rheumatology

## 2018-03-16 NOTE — Telephone Encounter (Signed)
Patient states she is having pain in her legs and her left shoulder. Patient states she just started having the pain worse 03/15/18.  Patient states the side effects of the Fosamax are joint pain and she feels like this has come from that. Patient states she has started having the pain recently. Patient states she is having pain in her neck. Patient states she is not sure what may be causing it. Patient advised to try Tylenol, heat and ice. Recommendations okay per Dr. Estanislado Pandy.

## 2018-03-16 NOTE — Telephone Encounter (Signed)
Patient called stating she is currently taking the Fosamax and doesn't feel like it is helping.  Patient is requesting a return call to let her know if there is something else she can take.  Patient states she is scheduled to take the medicine again this Sunday 11/10 and doesn't want to take it anymore.

## 2018-03-18 ENCOUNTER — Ambulatory Visit (HOSPITAL_COMMUNITY)
Admission: EM | Admit: 2018-03-18 | Discharge: 2018-03-18 | Disposition: A | Discharge status: Home / Self Care | Payer: PPO | Attending: Family Medicine | Admitting: Family Medicine

## 2018-03-18 ENCOUNTER — Encounter (HOSPITAL_COMMUNITY): Payer: Self-pay

## 2018-03-18 ENCOUNTER — Ambulatory Visit (INDEPENDENT_AMBULATORY_CARE_PROVIDER_SITE_OTHER): Payer: PPO

## 2018-03-18 DIAGNOSIS — I10 Essential (primary) hypertension: Secondary | ICD-10-CM

## 2018-03-18 DIAGNOSIS — M25512 Pain in left shoulder: Secondary | ICD-10-CM | POA: Diagnosis not present

## 2018-03-18 DIAGNOSIS — M542 Cervicalgia: Secondary | ICD-10-CM | POA: Diagnosis not present

## 2018-03-18 DIAGNOSIS — M19012 Primary osteoarthritis, left shoulder: Secondary | ICD-10-CM | POA: Diagnosis not present

## 2018-03-18 MED ORDER — ACETAMINOPHEN 500 MG PO TABS: 500.0000 mg | ORAL_TABLET | Freq: Four times a day (QID) | ORAL | 0 refills | Status: DC | PRN

## 2018-03-18 MED ORDER — DICLOFENAC SODIUM 1 % TD GEL: 2.0000 g | Freq: Four times a day (QID) | TRANSDERMAL | 1 refills | Status: DC

## 2018-03-18 NOTE — ED Triage Notes (Signed)
Pt presents with left shoulder pain that radiates up to her neck on that side.

## 2018-03-18 NOTE — ED Provider Notes (Signed)
[place]    CSN: [id number] Arrival date & time: 03/18/18  1531     History   Chief Complaint Chief Complaint  Patient presents with  . Shoulder Pain    HPI [name] [name] is a 82 y.o. female.   Pt is a 82 year old female that presents with 2 days of left shoulder pain.  She reports that the pain started suddenly after she was moving and felt a pop in the back of her shoulder.  The pain has been constant and remain the same.  The pain radiates up into her left lateral neck area and trapezius.  It also radiates down her left arm at times.  She denies any specific injury.  She denies any associated fever, chills, body aches, chest pain, shortness of breath, palpitations.  She denies any dizziness, headache, numbness, tingling, weakness.  ROS per HPI      Past Medical History:  Diagnosis Date  . Anxiety    takes Xanax daily as needed  . Asthma    Albuterol daily as needed  . Blood transfusion without reported diagnosis   . CAD (coronary artery disease)    LHC 2003 with 40% pLAD, 30% mLAD, 100% D1 (moderate vessel), 100%  D2 (small vessel), 95% mid small OM2.  Pt had PTCA to D1;  D2 and OM2 small vessels and tx medically;  Last Myoview (2012): anterior infarct seen with scar, no ischemia. EF normal. Med Rx // Myoview (12/14):  Low risk; ant scar w/ peri-infarct ischemia; EF 55% with ant AK // Myoview 5/16: EF 55-65, ant, apical scar, no ischemia   . Cataract   . Chronic back pain    HNP, DDD, spinal stenosis, vertebral compression fractures.   . Chronic nausea    takes Zofran daily as needed.  normal gastric emptying study 03/2013  . CKD (chronic kidney disease), stage III ([place]) 09/27/2017  . Constipation    felt to be functional. takes Senokot and Miralax daily.  treated with Linzess in past.   . DIVERTICULOSIS, COLON 08/28/2008   Qualifier: Diagnosis of  By: [name], [name], [name]    . Elevated LFTs 2015   probably med induced DILI, from Augmentin vs  Pravastatin  . GERD (gastroesophageal reflux disease)    hx of esophageal stricture   . Hiatal hernia    Paraesophageal hernias. s/p fundoplications in 3762 and 04/2013  . History of bronchitis several of yrs ago  . History of colon polyps 03/2009, 03/2011   adenomatous colon polyps, no high grade dysplasia.   [name] History of vertigo    no meds  . Hyperlipidemia    takes Pravastatin daily  . Hypertension    takes Amlodipine,Metoprolol,and Losartan daily  . Nausea 03/2016  . Osteoporosis   . PONV (postoperative nausea and vomiting)    yrs ago  . Slow urinary stream    takes Rapaflo daily  . TIA (transient ischemic attack) 1995   no residual effects noted    Patient Active Problem List   Diagnosis Date Noted  . Abnormal glucose 03/01/2018  . FHx: heart disease 03/01/2018  . Therapeutic opioid-induced constipation (OIC) 12/21/2017  . Abnormal UGI series 12/21/2017  . CKD (chronic kidney disease) stage 3, GFR 30-59 ml/min (HCC) 09/27/2017  . T12 compression fracture ([place]) 09/26/2017  . Chronic bronchitis ([place]) 08/09/2017  . Atherosclerosis of aorta ([place]) 04/05/2017  . Costochondritis 11/14/2016  . Anxiety state 06/08/2016  . Spondylolisthesis, lumbar region 04/13/2016  . Dysphagia   .  Lumbar radiculopathy, acute 12/07/2015    Class: Chronic  . Spinal stenosis, lumbar region, with neurogenic claudication 05/29/2015  . DDD (degenerative disc disease), lumbar 04/21/2015  . Hyperlipidemia, mixed 11/24/2014  . Gastroesophageal reflux disease 11/24/2014  . Depression, major, in partial remission ([place]) 09/26/2014  . Chronic pain syndrome 09/26/2014  . Coronary artery disease due to lipid rich plaque   . Osteoporosis 02/10/2014  . Prediabetes 08/14/2013  . Vitamin D deficiency 08/14/2013  . Medication management 08/14/2013  . Constipation, chronic 04/03/2013  . MGUS (monoclonal gammopathy of unknown significance) 03/05/2013  . Herniated lumbar intervertebral disc 06/25/2012     Class: Acute  . Vertebral compression fracture ([place]) 06/23/2012  . ESOPHAGEAL STRICTURE 08/28/2008  . Mild intermittent asthma without complication 58/85/0277  . Incarcerated recurrent paraesophageal hiatal hernia s/p lap redo repair [id number] 01/31/2008  . Essential hypertension 07/25/2007    Past Surgical History:  Procedure Laterality Date  . APPENDECTOMY     patient unsure of date  . BACK SURGERY    . BLADDER REPAIR    . CHOLECYSTECTOMY     Patient unsure of date.  . CORONARY ANGIOPLASTY  11/16/2001   1 stent  . ESOPHAGEAL MANOMETRY N/A 03/25/2013   Procedure: ESOPHAGEAL MANOMETRY (EM);  Surgeon: Inda Castle, MD;  Location: [facility] ENDOSCOPY;  Service: Endoscopy;  Laterality: N/A;  . ESOPHAGOGASTRODUODENOSCOPY N/A 03/15/2013   Procedure: ESOPHAGOGASTRODUODENOSCOPY (EGD);  Surgeon: Jerene Bears, MD;  Location[facility] ENDOSCOPY;  Service: Endoscopy;  Laterality: N/A;  . ESOPHAGOGASTRODUODENOSCOPY N/A 12/25/2015   Procedure: ESOPHAGOGASTRODUODENOSCOPY (EGD);  Surgeon: Doran Stabler, MD;  Location: [facility] ENDOSCOPY;  Service: Endoscopy;  Laterality: N/A;  . EYE SURGERY  11/2000   bilateral cataracts with lens implant  . FIXATION KYPHOPLASTY LUMBAR SPINE  X 2   "L3-4"  . HEMORRHOID SURGERY    . HIATAL HERNIA REPAIR  06/03/2011   Procedure: LAPAROSCOPIC REPAIR OF HIATAL HERNIA;  Surgeon: Adin Hector, MD;  Location: [facility] ORS;  Service: General;  Laterality: N/A;  . HIATAL HERNIA REPAIR N/A 04/24/2013   Procedure: LAPAROSCOPIC  REPAIR RECURRENT PARASOPHAGEAL HIATAL HERNIA WITH FUNDOPLICATION;  Surgeon: Adin Hector, MD;  Location: [facility] ORS;  Service: General;  Laterality: N/A;  . INSERTION OF MESH N/A 04/24/2013   Procedure: INSERTION OF MESH;  Surgeon: Adin Hector, MD;  Location: [facility] ORS;  Service: General;  Laterality: N/A;  . IR KYPHO THORACIC WITH BONE BIOPSY  09/29/2017  . IR KYPHO THORACIC WITH BONE BIOPSY  11/03/2017  . LAPAROSCOPIC LYSIS OF ADHESIONS N/A 04/24/2013   Procedure:  LAPAROSCOPIC LYSIS OF ADHESIONS;  Surgeon: Adin Hector, MD;  Location: [facility] ORS;  Service: General;  Laterality: N/A;  . LAPAROSCOPIC NISSEN FUNDOPLICATION  7/67/2094   Procedure: LAPAROSCOPIC NISSEN FUNDOPLICATION;  Surgeon: Adin Hector, MD;  Location: [facility] ORS;  Service: General;  Laterality: N/A;  . LUMBAR FUSION  12/07/2015   Right L2-3 facetectomy, posterolateral fusion L2-3 with pedicle screws, rods, local bone graft, Vivigen cancellous chips. Fusion extended to the L1 level with pedicle screws and rods  . LUMBAR LAMINECTOMY/DECOMPRESSION MICRODISCECTOMY Right 06/26/2012   Procedure: LUMBAR LAMINECTOMY/DECOMPRESSION MICRODISCECTOMY;  Surgeon: Jessy Oto, MD;  Location: [place];  Service: Orthopedics;  Laterality: Right;  RIGHT L4-5 MICRODISCECTOMY  . LUMBAR LAMINECTOMY/DECOMPRESSION MICRODISCECTOMY N/A 05/29/2015   Procedure: RIGHT L2-3 MICRODISCECTOMY;  Surgeon: Jessy Oto, MD;  Location: [place];  Service: Orthopedics;  Laterality: N/A;  . TOTAL ABDOMINAL HYSTERECTOMY  1975   partial  . UPPER  GASTROINTESTINAL ENDOSCOPY      OB History   None      Home Medications    Prior to Admission medications   Medication Sig Start Date End Date Taking? Authorizing Provider  albuterol (PROAIR HFA) 108 (90 Base) MCG/ACT inhaler Inhale 2 puffs into the lungs every 6 (six) hours as needed for wheezing or shortness of breath. 09/22/16  Yes Vicie Mutters, PA-C  alendronate (FOSAMAX) 70 MG tablet Take with a full glass of water on an empty stomach. 02/16/18  Yes Deveshwar, Abel Presto, MD  ALPRAZolam (XANAX) 0.5 MG tablet TAKE ONE-HALF TO ONE TABLET BY MOUTH 2 TO 3 TIMES DAILY ONLY IF ANXIETY ATTACK AND PLEASE TRY TO LIMIT TO 5 DAYS A WEEK TO AVOID ADDICTION 11/28/17  Yes [name], NP  amLODipine (NORVASC) 10 MG tablet TAKE 1 TABLET BY MOUTH AT BEDTIME 09/08/17  Yes [name], MD  aspirin EC 81 MG tablet Take 81 mg by mouth daily.   Yes [provider]  Cholecalciferol (VITAMIN  D3) 5000 units CAPS Take 5,000 Units by mouth 2 (two) times daily.    Yes [provider]  dicyclomine (BENTYL) 10 MG capsule TAKE 1 CAPSULE BY MOUTH EVERY 8 HOURS AS NEEDED FOR SPASM 02/05/18  Yes Armbruster, Carlota Raspberry, MD  gabapentin (NEURONTIN) 300 MG capsule TAKE 1 CAPSULE BY MOUTH THREE TIMES DAILY FOR  CHRONIC  PAIN. 10/06/17  Yes [name], MD  losartan (COZAAR) 100 MG tablet TAKE 1 TABLET BY MOUTH ONCE DAILY (DISCONTINUE  BENICAR) 02/06/18  Yes [name], MD  MAGNESIUM PO Take by mouth daily.   Yes [provider]  metoprolol tartrate (LOPRESSOR) 50 MG tablet Take 50 mg by mouth at bedtime.   Yes [provider]  mometasone-formoterol (DULERA) 100-5 MCG/ACT AERO Inhale 2 puffs into the lungs every 12 (twelve) hours. Patient taking differently: Inhale 2 puffs into the lungs every 12 (twelve) hours as needed for wheezing or shortness of breath.  09/08/17  Yes [name], MD  omeprazole (PRILOSEC) 20 MG capsule Take 1 capsule (20 mg total) by mouth daily. 01/24/18  Yes Armbruster, Carlota Raspberry, MD  oxyCODONE-acetaminophen (PERCOCET/ROXICET) 5-325 MG tablet Take 1 tablet by mouth every 4 (four) hours as needed for severe pain. 03/10/17  Yes Tegeler, Gwenyth Allegra, MD  polyethylene glycol powder (GLYCOLAX/MIRALAX) powder Take 17 g by mouth 2 (two) times daily. Patient taking differently: Take 17 g by mouth 2 (two) times daily as needed for mild constipation or moderate constipation.  05/16/17  Yes Armbruster, Carlota Raspberry, MD  pravastatin (PRAVACHOL) 40 MG tablet TAKE 1 TABLET BY MOUTH DAILY 03/14/18  Yes [name], MD  senna-docusate (SENOKOT-S) 8.6-50 MG tablet Take 2 tablets by mouth 2 (two) times daily. 12/26/15  Yes Emokpae, Courage, MD  silodosin (RAPAFLO) 8 MG CAPS capsule Take 8 mg by mouth daily with breakfast.    Yes [provider]  acetaminophen (TYLENOL) 500 MG tablet Take 1 tablet (500 mg total) by mouth every 6 (six) hours as needed. 03/18/18    Loura Halt A, NP  diclofenac sodium (VOLTAREN) 1 % GEL Apply 2 g topically 4 (four) times daily. 03/18/18   Shad Ledvina, Tressia Miners A, NP  ipratropium-albuterol (DUONEB) 0.5-2.5 (3) MG/3ML SOLN Take 3 mLs by nebulization every 6 (six) hours as needed.    [provider]  ketoconazole (NIZORAL) 2 % cream Apply 1 application topically 2 (two) times daily as needed for irritation.    [provider]  lubiprostone (AMITIZA) 8  MCG capsule Take 1 capsule (8 mcg total) by mouth 2 (two) times daily with a meal. 12/21/17   Zehr, Laban Emperor, PA-C  meclizine (ANTIVERT) 25 MG tablet 1/2-1 pill up to 3 times daily for motion sickness/dizziness Patient taking differently: Take 12.5-25 mg by mouth 3 (three) times daily as needed for dizziness. 1/2-1 pill up to 3 times daily for motion sickness/dizziness 06/08/16   Vicie Mutters, PA-C  nitroGLYCERIN (NITROSTAT) 0.4 MG SL tablet Place 1 tablet (0.4 mg total) under the tongue every 5 (five) minutes as needed for chest pain. 09/04/14   Larey Dresser, MD  ondansetron (ZOFRAN-ODT) 8 MG disintegrating tablet DISSOLVE ONE TABLET IN MOUTH EVERY 8 HOURS AS NEEDED FOR NAUSEA AND  VOMITING 10/05/17   [name], NP  OVER THE COUNTER MEDICATION Take 1,000 mg by mouth 2 (two) times daily. [place] Calcium 1000mg     [provider]  promethazine (PHENERGAN) 25 MG tablet Take 1 tablet (25 mg total) by mouth every 6 (six) hours as needed for nausea or vomiting. 11/11/17   Danford, Suann Larry, MD    Family History Family History  Problem Relation Age of Onset  . Colon cancer Mother 66  . Heart attack Father 63       heart attack  . Brain cancer Brother        tumor   . Heart disease Brother   . Heart disease Brother   . Kidney disease Daughter   . Esophageal cancer Neg Hx   . Stomach cancer Neg Hx     Social History Social History   Tobacco Use  . Smoking status: Never Smoker  . Smokeless tobacco: Never Used  Substance Use Topics  . Alcohol  use: No  . Drug use: Never     Allergies   Ace inhibitors; Aspartame; Atorvastatin; Biaxin [clarithromycin]; Daliresp [roflumilast]; Erythromycin; and Levofloxacin   Review of Systems Review of Systems   Physical Exam Triage Vital Signs ED Triage Vitals  Enc Vitals Group     BP 03/18/18 1559 (!) 149/96     Pulse Rate 03/18/18 1559 91     Resp 03/18/18 1559 16     Temp 03/18/18 1559 97.8 F (36.6 C)     Temp Source 03/18/18 1559 Oral     SpO2 03/18/18 1559 95 %     Weight --      Height --      Head Circumference --      Peak Flow --      Pain Score 03/18/18 1616 9     Pain Loc --      Pain Edu? --      Excl. in [place]? --    No data found.  Updated Vital Signs BP (!) 149/96 (BP Location: Left Arm)   Pulse 91   Temp 97.8 F (36.6 C) (Oral)   Resp 16   SpO2 95%   Visual Acuity Right Eye Distance:   Left Eye Distance:   Bilateral Distance:    Right Eye Near:   Left Eye Near:    Bilateral Near:     Physical Exam  Constitutional: She appears well-developed and well-nourished.  Appears in pain   HENT:  Head: Normocephalic and atraumatic.  Eyes: Conjunctivae are normal.  Neck: Normal range of motion.  Cardiovascular: Normal rate, regular rhythm and normal heart sounds.  Pulmonary/Chest: Effort normal and breath sounds normal.  Lungs clear in all fields. No dyspnea or distress. No retractions or nasal flaring.  Musculoskeletal: Normal range of motion.  Patient has good range of motion of left shoulder.  Mild tenderness to left posterior shoulder and trapezius muscle.  No bruising, swelling, erythema or deformities.  No rashes  Neurological: She is alert. No sensory deficit.  Skin: Skin is warm. No rash noted. No erythema. No pallor.  Psychiatric: She has a normal mood and affect.  Nursing note and vitals reviewed.    UC Treatments / Results  Labs (all labs ordered are listed, but only abnormal results are displayed) Labs Reviewed - No data to  display  EKG None  Radiology Dg Shoulder Left  Result Date: 03/18/2018 CLINICAL DATA:  Acute left shoulder pain without known injury. EXAM: LEFT SHOULDER - 2+ VIEW COMPARISON:  None. FINDINGS: There is no evidence of fracture or dislocation. Mild degenerative change of left glenohumeral joint is noted. Soft tissues are unremarkable. IMPRESSION: Mild osteoarthritis of left glenohumeral joint. No acute abnormality seen in the left shoulder. Electronically Signed   By: Marijo Conception, M.D.   On: 03/18/2018 17:24    Procedures Procedures (including critical care time)  Medications Ordered in UC Medications - No data to display  Initial Impression / Assessment and Plan / UC Course  I have reviewed the triage vital signs and the nursing notes.  Pertinent labs & imaging results that were available during my care of the patient were reviewed by me and considered in my medical decision making (see chart for details).     Patient is a 82 year old female comes in with sudden onset shoulder pain after hearing a pop.  Most likely this is musculoskeletal pain. X-ray revealed mild osteoarthritis of the left glenohumeral joint. No acute fracture dislocation. EKG was similar to previous EKGs.  No concern for ACS. Patient neurologically intact no concern for CVA. Patient has good range of motion Vital signs stable, nontoxic or ill-appearing. We will have patient do Voltaren gel and extra joint Tylenol not to exceed 3000 mg in 1 day. For continued worsening symptoms patient is instructed to follow-up with her primary care or go to the hospital. Patient refused sling Final Clinical Impressions(s) / UC Diagnoses   Final diagnoses:  None     Discharge Instructions     Your x-ray showed some mild osteoarthritis but did not reveal any fractures or other abnormalities. We will place you in a sling to help with comfort Make sure you are icing the area Continue with your regular pain medication  as prescribed You can do Tylenol extra strength If not better in the next couple of days you will need to  follow-up with orthopedic.    ED Prescriptions    Medication Sig Dispense Auth. Provider   diclofenac sodium (VOLTAREN) 1 % GEL Apply 2 g topically 4 (four) times daily. 1 Tube Sovereign Ramiro A, NP   acetaminophen (TYLENOL) 500 MG tablet Take 1 tablet (500 mg total) by mouth every 6 (six) hours as needed. 30 tablet Loura Halt A, NP     Controlled Substance Prescriptions [place] Controlled Substance Registry consulted? Not Applicable   Orvan July, NP 03/19/18 1018

## 2018-03-18 NOTE — ED Triage Notes (Signed)
Reports feeling a "pop" in left shoulder 2 nights ago suddenly, radiating up into left lateral neck.  Denies any chest pain.  Has been using Voltaren get without relief.  CMS intact LUE.

## 2018-03-18 NOTE — Discharge Instructions (Addendum)
Your x-ray showed some mild osteoarthritis but did not reveal any fractures or other abnormalities. We will place you in a sling to help with comfort Make sure you are icing the area Continue with your regular pain medication as prescribed You can do Tylenol extra strength If not better in the next couple of days you will need to  follow-up with orthopedic.

## 2018-03-20 ENCOUNTER — Telehealth: Payer: Self-pay | Admitting: Rheumatology

## 2018-03-20 ENCOUNTER — Ambulatory Visit (INDEPENDENT_AMBULATORY_CARE_PROVIDER_SITE_OTHER): Payer: PPO | Admitting: Family Medicine

## 2018-03-20 ENCOUNTER — Other Ambulatory Visit: Payer: Self-pay | Admitting: Rheumatology

## 2018-03-20 ENCOUNTER — Encounter (INDEPENDENT_AMBULATORY_CARE_PROVIDER_SITE_OTHER): Payer: Self-pay | Admitting: Family Medicine

## 2018-03-20 DIAGNOSIS — M542 Cervicalgia: Secondary | ICD-10-CM | POA: Diagnosis not present

## 2018-03-20 DIAGNOSIS — M25512 Pain in left shoulder: Secondary | ICD-10-CM | POA: Diagnosis not present

## 2018-03-20 NOTE — Progress Notes (Signed)
COBIE LEIDNER - 82 y.o. female MRN [id number]  Date of birth: Nov 24, 1935    SUBJECTIVE:      Chief Complaint:/ HPI:  82 year old female with right shoulder pain. -Patient presents with 3 days of left shoulder pain.  She denies any specific injury.  She states she moves her arm, heard a pop in her shoulder and her pain began.  She was seen at an urgent care and had x-rays performed.  She continues to have pain.  She reports pain with motion of the shoulder.  She denies any one particular motion that causes her pain.  She localizes her pain over the lateral and posterior shoulder and states that it radiates up the side of her neck.  It occasionally radiates down toward the elbow.  She denies any decreased range of motion.  No numbness or tingling extremity.  No weakness.  No associated skin changes.  She has no history of shoulder injury or surgery.   ROS:     See HPI  PERTINENT  PMH / PSH FH / / SH:  Past Medical, Surgical, Social, and Family History Reviewed & Updated in the EMR.    OBJECTIVE: There were no vitals taken for this visit.  Physical Exam:  Vital signs are reviewed.  GEN: Alert and oriented, NAD Pulm: Breathing unlabored PSY: normal mood, congruent affect  MSK: Left shoulder: No obvious deformity or asymmetry. No bruising. No swelling No tenderness over the [facility] joint.  There is tenderness bilaterally over the biceps tendon proximally.  Patient has tender palpable trigger points over the posterior aspect of the trapezius. Full ROM in flexion, abduction, internal/external rotation.  Mild pain with range of motion above 90 degrees. NV intact distally Special Tests:  - Impingement: Minimal pain with Hawkins and Neers.  - Supraspinatous: Minimal pain with empty can.  5/5 strength - Infraspinatous/Teres: 5/5 strength with ER - Subscapularis: negative belly press, minimal pain with bear hug. 5/5 strength with IR - Biceps tendon: Negative Speeds.  - Labrum: Negative Obriens.    MSK [place]: Ultrasound of Shoulder-left shoulder  BT short: Biceps tendon intact with trace fluid surrounding the tendon BT long: Intact without tears Supraspinatus tendon: There is a small 2-3 mm, partial, articular surface tear of the supraspinatus without significant retraction.  Fluid visualized within the subacromial bursa.  Cortical irregularity noted in the humerus Subscapularis tendon: There is some subtle hypoechoic change but no obvious tearing Infraspinatus tendon: Intact without tears AC joint: Degenerative changes GH joint: Calcifications visualized within the labrum.  No obvious tearing seen  Summary and Additional findings-partial-thickness articular surface tear of the supraspinatus     ASSESSMENT & PLAN:  1.  Left shoulder pain- patient is somewhat poorly localized and general left shoulder pain.  She has palpable trigger points within her trapezius.  There is evidence of a small supraspinatus tear, bursitis, as well as glenohumeral arthritis.  Prior x-rays from the urgent care were reviewed today consistent with arthritis. -A subacromial steroid injection performed today which we will diagnostic and therapeutic. -She will follow-up as needed    Procedure performed: subacromial corticosteroid injection; palpation guided  Consent obtained and verified. Time-out conducted. Noted no overlying erythema, induration, or other signs of local infection. The left posterior subacromial space was palpated and marked. The overlying skin was prepped in a sterile fashion. Topical analgesic spray: Ethyl chloride. Joint: left subacromial Needle: 25ga, 1.5" Completed without difficulty. Meds: depomedrol 40mg , lidocaine 3cc.  Advised to call if fevers/chills, erythema, induration, drainage,  or persistent bleeding.

## 2018-03-20 NOTE — Progress Notes (Signed)
I saw and examined the patient with Dr. Okey Dupre and agree with assessment and plan as outlined.  She has a tender myofascial trigger points in the left rhomboid area and trapezius belly, but also evidence of partial articular surface supraspinatus tear on ultrasound.  We will inject the posterior subacromial space today.  If symptoms persist, then physical therapy for myofascial release techniques.

## 2018-03-20 NOTE — Telephone Encounter (Signed)
Prescription was sent to pharmacy for a 90 day supply on 02/16/18. Patient will need to contact pharmacy.  Attempted to contact the patient and left message for patient to call the office.

## 2018-03-20 NOTE — Telephone Encounter (Signed)
Patient needs a refill on Fosomax sent to [place] at [facility]. Patient has one dose for Sunday left.

## 2018-03-23 DIAGNOSIS — G894 Chronic pain syndrome: Secondary | ICD-10-CM | POA: Diagnosis not present

## 2018-03-23 DIAGNOSIS — R110 Nausea: Secondary | ICD-10-CM | POA: Diagnosis not present

## 2018-03-23 DIAGNOSIS — Z79891 Long term (current) use of opiate analgesic: Secondary | ICD-10-CM | POA: Diagnosis not present

## 2018-03-23 DIAGNOSIS — M4726 Other spondylosis with radiculopathy, lumbar region: Secondary | ICD-10-CM | POA: Diagnosis not present

## 2018-03-26 DIAGNOSIS — H00024 Hordeolum internum left upper eyelid: Secondary | ICD-10-CM | POA: Diagnosis not present

## 2018-04-02 ENCOUNTER — Other Ambulatory Visit: Payer: Self-pay | Admitting: Physician Assistant

## 2018-04-02 DIAGNOSIS — B372 Candidiasis of skin and nail: Secondary | ICD-10-CM

## 2018-04-09 DIAGNOSIS — H0014 Chalazion left upper eyelid: Secondary | ICD-10-CM | POA: Diagnosis not present

## 2018-04-12 ENCOUNTER — Encounter (HOSPITAL_COMMUNITY): Payer: Self-pay | Admitting: Emergency Medicine

## 2018-04-12 ENCOUNTER — Ambulatory Visit (HOSPITAL_COMMUNITY)
Admission: EM | Admit: 2018-04-12 | Discharge: 2018-04-12 | Disposition: A | Discharge status: Home / Self Care | Payer: PPO | Source: Home / Self Care

## 2018-04-12 ENCOUNTER — Observation Stay (HOSPITAL_COMMUNITY)
Admission: EM | Admit: 2018-04-12 | Discharge: 2018-04-14 | Disposition: A | Discharge status: Home / Self Care | Payer: PPO | Attending: Internal Medicine | Admitting: Internal Medicine

## 2018-04-12 ENCOUNTER — Observation Stay (HOSPITAL_COMMUNITY): Payer: PPO

## 2018-04-12 ENCOUNTER — Other Ambulatory Visit: Payer: Self-pay

## 2018-04-12 DIAGNOSIS — Z7982 Long term (current) use of aspirin: Secondary | ICD-10-CM | POA: Diagnosis not present

## 2018-04-12 DIAGNOSIS — R9431 Abnormal electrocardiogram [ECG] [EKG]: Secondary | ICD-10-CM | POA: Diagnosis not present

## 2018-04-12 DIAGNOSIS — Z888 Allergy status to other drugs, medicaments and biological substances status: Secondary | ICD-10-CM | POA: Diagnosis not present

## 2018-04-12 DIAGNOSIS — I1310 Hypertensive heart and chronic kidney disease without heart failure, with stage 1 through stage 4 chronic kidney disease, or unspecified chronic kidney disease: Secondary | ICD-10-CM | POA: Insufficient documentation

## 2018-04-12 DIAGNOSIS — Z9049 Acquired absence of other specified parts of digestive tract: Secondary | ICD-10-CM | POA: Insufficient documentation

## 2018-04-12 DIAGNOSIS — Z881 Allergy status to other antibiotic agents status: Secondary | ICD-10-CM | POA: Diagnosis not present

## 2018-04-12 DIAGNOSIS — M549 Dorsalgia, unspecified: Secondary | ICD-10-CM | POA: Insufficient documentation

## 2018-04-12 DIAGNOSIS — F411 Generalized anxiety disorder: Secondary | ICD-10-CM | POA: Diagnosis present

## 2018-04-12 DIAGNOSIS — Z8249 Family history of ischemic heart disease and other diseases of the circulatory system: Secondary | ICD-10-CM | POA: Insufficient documentation

## 2018-04-12 DIAGNOSIS — I10 Essential (primary) hypertension: Secondary | ICD-10-CM | POA: Diagnosis present

## 2018-04-12 DIAGNOSIS — R112 Nausea with vomiting, unspecified: Principal | ICD-10-CM

## 2018-04-12 DIAGNOSIS — F419 Anxiety disorder, unspecified: Secondary | ICD-10-CM | POA: Insufficient documentation

## 2018-04-12 DIAGNOSIS — E782 Mixed hyperlipidemia: Secondary | ICD-10-CM | POA: Diagnosis not present

## 2018-04-12 DIAGNOSIS — E875 Hyperkalemia: Secondary | ICD-10-CM | POA: Insufficient documentation

## 2018-04-12 DIAGNOSIS — K219 Gastro-esophageal reflux disease without esophagitis: Secondary | ICD-10-CM | POA: Insufficient documentation

## 2018-04-12 DIAGNOSIS — K222 Esophageal obstruction: Secondary | ICD-10-CM | POA: Diagnosis not present

## 2018-04-12 DIAGNOSIS — J4520 Mild intermittent asthma, uncomplicated: Secondary | ICD-10-CM | POA: Diagnosis present

## 2018-04-12 DIAGNOSIS — N183 Chronic kidney disease, stage 3 (moderate): Secondary | ICD-10-CM | POA: Diagnosis not present

## 2018-04-12 DIAGNOSIS — R4189 Other symptoms and signs involving cognitive functions and awareness: Secondary | ICD-10-CM | POA: Diagnosis present

## 2018-04-12 DIAGNOSIS — Z79899 Other long term (current) drug therapy: Secondary | ICD-10-CM | POA: Diagnosis not present

## 2018-04-12 DIAGNOSIS — Z8673 Personal history of transient ischemic attack (TIA), and cerebral infarction without residual deficits: Secondary | ICD-10-CM | POA: Diagnosis not present

## 2018-04-12 DIAGNOSIS — R5383 Other fatigue: Secondary | ICD-10-CM | POA: Diagnosis not present

## 2018-04-12 DIAGNOSIS — G894 Chronic pain syndrome: Secondary | ICD-10-CM | POA: Diagnosis not present

## 2018-04-12 DIAGNOSIS — I2510 Atherosclerotic heart disease of native coronary artery without angina pectoris: Secondary | ICD-10-CM | POA: Diagnosis not present

## 2018-04-12 LAB — URINALYSIS, COMPLETE (UACMP) WITH MICROSCOPIC
Bilirubin Urine: NEGATIVE
Glucose, UA: NEGATIVE mg/dL
Hgb urine dipstick: NEGATIVE
KETONES UR: 20 mg/dL — AB
Leukocytes, UA: NEGATIVE
Nitrite: NEGATIVE
Protein, ur: 30 mg/dL — AB
Specific Gravity, Urine: 1.017 (ref 1.005–1.030)
pH: 8 (ref 5.0–8.0)

## 2018-04-12 LAB — COMPREHENSIVE METABOLIC PANEL
ALT: 18 U/L (ref 0–44)
AST: 42 U/L — AB (ref 15–41)
Albumin: 4.3 g/dL (ref 3.5–5.0)
Alkaline Phosphatase: 59 U/L (ref 38–126)
Anion gap: 16 — ABNORMAL HIGH (ref 5–15)
BILIRUBIN TOTAL: 1.5 mg/dL — AB (ref 0.3–1.2)
BUN: 11 mg/dL (ref 8–23)
CHLORIDE: 105 mmol/L (ref 98–111)
CO2: 15 mmol/L — ABNORMAL LOW (ref 22–32)
Calcium: 9.4 mg/dL (ref 8.9–10.3)
Creatinine, Ser: 0.8 mg/dL (ref 0.44–1.00)
GFR calc Af Amer: >60 mL/min (ref >60)
GFR calc non Af Amer: >60 mL/min (ref >60)
GLUCOSE: 122 mg/dL — AB (ref 70–99)
POTASSIUM: 5.3 mmol/L — AB (ref 3.5–5.1)
Sodium: 136 mmol/L (ref 135–145)
TOTAL PROTEIN: 8 g/dL (ref 6.5–8.1)

## 2018-04-12 LAB — MAGNESIUM: MAGNESIUM: 2 mg/dL (ref 1.7–2.4)

## 2018-04-12 LAB — CBG MONITORING, ED: GLUCOSE-CAPILLARY: 116 mg/dL — AB (ref 70–99)

## 2018-04-12 LAB — CBC
HEMATOCRIT: 48.1 % — AB (ref 36.0–46.0)
HEMOGLOBIN: 15.5 g/dL — AB (ref 12.0–15.0)
MCH: 30.9 pg (ref 26.0–34.0)
MCHC: 32.2 g/dL (ref 30.0–36.0)
MCV: 96 fL (ref 80.0–100.0)
Platelets: 336 10*3/uL (ref 150–400)
RBC: 5.01 MIL/uL (ref 3.87–5.11)
RDW: 13.5 % (ref 11.5–15.5)
WBC: 8.1 10*3/uL (ref 4.0–10.5)
nRBC: 0 % (ref 0.0–0.2)

## 2018-04-12 LAB — I-STAT TROPONIN, ED: TROPONIN I, POC: 0 ng/mL (ref 0.00–0.08)

## 2018-04-12 LAB — LIPASE, BLOOD: Lipase: 20 U/L (ref 11–51)

## 2018-04-12 MED ORDER — ACETAMINOPHEN 325 MG PO TABS
650.0000 mg | ORAL_TABLET | Freq: Four times a day (QID) | ORAL | Status: DC | PRN
  Filled 2018-04-12: qty 2

## 2018-04-12 MED ORDER — SODIUM CHLORIDE 0.9 % IV SOLN
Freq: Once | INTRAVENOUS | Status: AC
  Administered 2018-04-13: 01:00:00 via INTRAVENOUS

## 2018-04-12 MED ORDER — ONDANSETRON HCL 4 MG/2ML IJ SOLN
4.0000 mg | Freq: Once | INTRAMUSCULAR | Status: AC
  Administered 2018-04-12: 4 mg via INTRAVENOUS
  Filled 2018-04-12: qty 2

## 2018-04-12 MED ORDER — MECLIZINE HCL 25 MG PO TABS
12.5000 mg | ORAL_TABLET | Freq: Three times a day (TID) | ORAL | Status: DC | PRN
  Filled 2018-04-12: qty 1

## 2018-04-12 MED ORDER — IPRATROPIUM-ALBUTEROL 0.5-2.5 (3) MG/3ML IN SOLN: 3.0000 mL | Freq: Four times a day (QID) | RESPIRATORY_TRACT | Status: DC | PRN

## 2018-04-12 MED ORDER — TAMSULOSIN HCL 0.4 MG PO CAPS
0.4000 mg | ORAL_CAPSULE | Freq: Every day | ORAL | Status: DC
  Administered 2018-04-13 – 2018-04-14 (×2): 0.4 mg via ORAL
  Filled 2018-04-12 (×2): qty 1

## 2018-04-12 MED ORDER — METOPROLOL TARTRATE 25 MG PO TABS
25.0000 mg | ORAL_TABLET | Freq: Every day | ORAL | Status: DC
  Filled 2018-04-12 (×2): qty 1

## 2018-04-12 MED ORDER — PROMETHAZINE HCL 25 MG PO TABS
12.5000 mg | ORAL_TABLET | Freq: Once | ORAL | Status: AC
  Administered 2018-04-12: 12.5 mg via ORAL
  Filled 2018-04-12: qty 1

## 2018-04-12 MED ORDER — AMLODIPINE BESYLATE 10 MG PO TABS
10.0000 mg | ORAL_TABLET | Freq: Every day | ORAL | Status: DC
  Filled 2018-04-12: qty 2
  Filled 2018-04-12: qty 1

## 2018-04-12 MED ORDER — SODIUM CHLORIDE 0.9 % IV BOLUS
1000.0000 mL | Freq: Once | INTRAVENOUS | Status: AC
  Administered 2018-04-12: 1000 mL via INTRAVENOUS

## 2018-04-12 MED ORDER — DICYCLOMINE HCL 10 MG PO CAPS
10.0000 mg | ORAL_CAPSULE | Freq: Three times a day (TID) | ORAL | Status: DC | PRN
  Filled 2018-04-12: qty 1

## 2018-04-12 MED ORDER — PRAVASTATIN SODIUM 40 MG PO TABS
40.0000 mg | ORAL_TABLET | Freq: Every day | ORAL | Status: DC
  Administered 2018-04-13 – 2018-04-14 (×2): 40 mg via ORAL
  Filled 2018-04-12 (×2): qty 1

## 2018-04-12 MED ORDER — ENOXAPARIN SODIUM 40 MG/0.4ML ~~LOC~~ SOLN
40.0000 mg | Freq: Every day | SUBCUTANEOUS | Status: DC
  Administered 2018-04-13 – 2018-04-14 (×2): 40 mg via SUBCUTANEOUS
  Filled 2018-04-12 (×2): qty 0.4

## 2018-04-12 MED ORDER — HALOPERIDOL LACTATE 5 MG/ML IJ SOLN
2.0000 mg | Freq: Once | INTRAMUSCULAR | Status: AC
  Administered 2018-04-12: 2 mg via INTRAVENOUS
  Filled 2018-04-12: qty 1

## 2018-04-12 MED ORDER — ASPIRIN EC 81 MG PO TBEC
81.0000 mg | DELAYED_RELEASE_TABLET | Freq: Every day | ORAL | Status: DC
  Administered 2018-04-13 – 2018-04-14 (×2): 81 mg via ORAL
  Filled 2018-04-12 (×2): qty 1

## 2018-04-12 MED ORDER — HYDRALAZINE HCL 20 MG/ML IJ SOLN
10.0000 mg | INTRAMUSCULAR | Status: DC | PRN
  Administered 2018-04-13: 10 mg via INTRAVENOUS
  Filled 2018-04-12: qty 1

## 2018-04-12 MED ORDER — METOCLOPRAMIDE HCL 5 MG/ML IJ SOLN
10.0000 mg | Freq: Once | INTRAMUSCULAR | Status: AC
  Administered 2018-04-12: 10 mg via INTRAVENOUS
  Filled 2018-04-12: qty 2

## 2018-04-12 MED ORDER — LOSARTAN POTASSIUM 50 MG PO TABS
50.0000 mg | ORAL_TABLET | Freq: Every day | ORAL | Status: DC
  Administered 2018-04-13 – 2018-04-14 (×2): 50 mg via ORAL
  Filled 2018-04-12 (×2): qty 1

## 2018-04-12 MED ORDER — ONDANSETRON HCL 4 MG PO TABS: 4.0000 mg | ORAL_TABLET | Freq: Four times a day (QID) | ORAL | Status: DC | PRN

## 2018-04-12 MED ORDER — ONDANSETRON HCL 4 MG/2ML IJ SOLN
4.0000 mg | Freq: Four times a day (QID) | INTRAMUSCULAR | Status: DC | PRN
  Administered 2018-04-13: 4 mg via INTRAVENOUS
  Filled 2018-04-12: qty 2

## 2018-04-12 MED ORDER — ACETAMINOPHEN 650 MG RE SUPP: 650.0000 mg | Freq: Four times a day (QID) | RECTAL | Status: DC | PRN

## 2018-04-12 MED ORDER — MOMETASONE FURO-FORMOTEROL FUM 100-5 MCG/ACT IN AERO
2.0000 | INHALATION_SPRAY | Freq: Two times a day (BID) | RESPIRATORY_TRACT | Status: DC
  Administered 2018-04-13: 2 via RESPIRATORY_TRACT
  Filled 2018-04-12 (×2): qty 8.8

## 2018-04-12 NOTE — ED Triage Notes (Signed)
Pt reports N/V since last night with some weakness, dizziness. Denies fever, CP, SOB. Has back pain but is chronic issue. Hasn't taken anything for N/V. States she can't keep anything down. Cardiac hx with stent placement in the 90s.

## 2018-04-12 NOTE — H&P (Signed)
History and Physical    [name] [name] XBM:[id number] DOB: 1936-04-20 DOA: 04/12/2018  Referring MD/NP/PA: Dr. Fabio Bering (resident) PCP: [name], MD  Patient coming from: home  Chief Complaint: Nausea and vomiting  I have personally briefly reviewed patient's old medical records in [place]   HPI: [name] [name] is a 82 y.o. female with medical history significant of HTN, HLD, stage III kidney disease, CAD, chronic back pain s/p kyphoplasty, chronic nausea, and severe hiatal hernia with history of fundoplication; who presents with nausea and vomiting.  Symptoms reportedly started after having [employer] yesterday evening.  She reports having several episodes of nonbloody emesis.  Other associated symptoms included complaints of back pain which appears chronic for which patient takes hydrocodone and reports for loose stools.  Denies having any fever, chest pain, or shortness of breath.  Review of records shows that patient has had previous admissions with similar symptoms last occurring in July of this year.  Patient is followed by Dr. Havery Moros for history of dysphagia.   ED Course: Patient was given 1 L normal saline IV fluids, Phenergan, Zofran, Reglan, and Haldol without relief of nausea vomiting symptoms.  TR[facility] called to admit.  Review of Systems  Constitutional: Positive for malaise/fatigue.  HENT: Negative for congestion and sinus pain.   Eyes: Negative for photophobia.  Respiratory: Negative for cough and shortness of breath.   Cardiovascular: Negative for chest pain and leg swelling.  Gastrointestinal: Positive for abdominal pain, diarrhea, nausea and vomiting. Negative for blood in stool.  Genitourinary: Negative for dysuria and hematuria.  Musculoskeletal: Positive for myalgias. Negative for falls.  Neurological: Positive for weakness. Negative for focal weakness.    Past Medical History:  Diagnosis Date  . Anxiety    takes Xanax daily as needed  . Asthma    Albuterol daily as needed  . Blood transfusion without reported diagnosis   . CAD (coronary artery disease)    LHC 2003 with 40% pLAD, 30% mLAD, 100% D1 (moderate vessel), 100%  D2 (small vessel), 95% mid small OM2.  Pt had PTCA to D1;  D2 and OM2 small vessels and tx medically;  Last Myoview (2012): anterior infarct seen with scar, no ischemia. EF normal. Med Rx // Myoview (12/14):  Low risk; ant scar w/ peri-infarct ischemia; EF 55% with ant AK // Myoview 5/16: EF 55-65, ant, apical scar, no ischemia   . Cataract   . Chronic back pain    HNP, DDD, spinal stenosis, vertebral compression fractures.   . Chronic nausea    takes Zofran daily as needed.  normal gastric emptying study 03/2013  . CKD (chronic kidney disease), stage III ([place]) 09/27/2017  . Constipation    felt to be functional. takes Senokot and Miralax daily.  treated with Linzess in past.   . DIVERTICULOSIS, COLON 08/28/2008   Qualifier: Diagnosis of  By: [name], [name], [name]    . Elevated LFTs 2015   probably med induced DILI, from Augmentin vs Pravastatin  . GERD (gastroesophageal reflux disease)    hx of esophageal stricture   . Hiatal hernia    Paraesophageal hernias. s/p fundoplications in 0272 and 04/2013  . History of bronchitis several of yrs ago  . History of colon polyps 03/2009, 03/2011   adenomatous colon polyps, no high grade dysplasia.   [name] History of vertigo    no meds  . Hyperlipidemia    takes Pravastatin daily  . Hypertension    takes Amlodipine,Metoprolol,and Losartan daily  .  Nausea 03/2016  . Osteoporosis   . PONV (postoperative nausea and vomiting)    yrs ago  . Slow urinary stream    takes Rapaflo daily  . TIA (transient ischemic attack) 1995   no residual effects noted    Past Surgical History:  Procedure Laterality Date  . APPENDECTOMY     patient unsure of date  . BACK SURGERY    . BLADDER REPAIR    . CHOLECYSTECTOMY     Patient unsure of date.  . CORONARY ANGIOPLASTY  11/16/2001   1  stent  . ESOPHAGEAL MANOMETRY N/A 03/25/2013   Procedure: ESOPHAGEAL MANOMETRY (EM);  Surgeon: Inda Castle, MD;  Location: [facility] ENDOSCOPY;  Service: Endoscopy;  Laterality: N/A;  . ESOPHAGOGASTRODUODENOSCOPY N/A 03/15/2013   Procedure: ESOPHAGOGASTRODUODENOSCOPY (EGD);  Surgeon: Jerene Bears, MD;  Location[facility] ENDOSCOPY;  Service: Endoscopy;  Laterality: N/A;  . ESOPHAGOGASTRODUODENOSCOPY N/A 12/25/2015   Procedure: ESOPHAGOGASTRODUODENOSCOPY (EGD);  Surgeon: Doran Stabler, MD;  Location: [facility] ENDOSCOPY;  Service: Endoscopy;  Laterality: N/A;  . EYE SURGERY  11/2000   bilateral cataracts with lens implant  . FIXATION KYPHOPLASTY LUMBAR SPINE  X 2   "L3-4"  . HEMORRHOID SURGERY    . HIATAL HERNIA REPAIR  06/03/2011   Procedure: LAPAROSCOPIC REPAIR OF HIATAL HERNIA;  Surgeon: Adin Hector, MD;  Location: [facility] ORS;  Service: General;  Laterality: N/A;  . HIATAL HERNIA REPAIR N/A 04/24/2013   Procedure: LAPAROSCOPIC  REPAIR RECURRENT PARASOPHAGEAL HIATAL HERNIA WITH FUNDOPLICATION;  Surgeon: Adin Hector, MD;  Location: [facility] ORS;  Service: General;  Laterality: N/A;  . INSERTION OF MESH N/A 04/24/2013   Procedure: INSERTION OF MESH;  Surgeon: Adin Hector, MD;  Location: [facility] ORS;  Service: General;  Laterality: N/A;  . IR KYPHO THORACIC WITH BONE BIOPSY  09/29/2017  . IR KYPHO THORACIC WITH BONE BIOPSY  11/03/2017  . LAPAROSCOPIC LYSIS OF ADHESIONS N/A 04/24/2013   Procedure: LAPAROSCOPIC LYSIS OF ADHESIONS;  Surgeon: Adin Hector, MD;  Location: [facility];  Service: General;  Laterality: N/A;  . LAPAROSCOPIC NISSEN FUNDOPLICATION  5/95/6387   Procedure: LAPAROSCOPIC NISSEN FUNDOPLICATION;  Surgeon: Adin Hector, MD;  Location: [facility]S;  Service: General;  Laterality: N/A;  . LUMBAR FUSION  12/07/2015   Right L2-3 facetectomy, posterolateral fusion L2-3 with pedicle screws, rods, local bone graft, Vivigen cancellous chips. Fusion extended to the L1 level with pedicle screws and rods  .  LUMBAR LAMINECTOMY/DECOMPRESSION MICRODISCECTOMY Right 06/26/2012   Procedure: LUMBAR LAMINECTOMY/DECOMPRESSION MICRODISCECTOMY;  Surgeon: Jessy Oto, MD;  Location: [place];  Service: Orthopedics;  Laterality: Right;  RIGHT L4-5 MICRODISCECTOMY  . LUMBAR LAMINECTOMY/DECOMPRESSION MICRODISCECTOMY N/A 05/29/2015   Procedure: RIGHT L2-3 MICRODISCECTOMY;  Surgeon: Jessy Oto, MD;  Location: [place];  Service: Orthopedics;  Laterality: N/A;  . TOTAL ABDOMINAL HYSTERECTOMY  1975   partial  . UPPER GASTROINTESTINAL ENDOSCOPY       reports that she has never smoked. She has never used smokeless tobacco. She reports that she does not drink alcohol or use drugs.  Allergies  Allergen Reactions  . Ace Inhibitors Cough    Per Dr Melvyn Novas pulmonology 2013  . Aspartame Diarrhea and Nausea And Vomiting  . Atorvastatin Other (See Comments)    Muscle aches  . Biaxin [Clarithromycin] Other (See Comments)    PATIENT CAN TOLERATE Z-PAK.  Is written on patient's paper chart.  [name] [Roflumilast] Nausea And Vomiting  . Erythromycin Other (See Comments)  PATIENT CAN TOLERATE Z-PAK.  It is written on patient's paper chart.  . Levofloxacin Nausea And Vomiting    Family History  Problem Relation Age of Onset  . Colon cancer Mother 31  . Heart attack Father 54       heart attack  . Brain cancer Brother        tumor   . Heart disease Brother   . Heart disease Brother   . Kidney disease Daughter   . Esophageal cancer Neg Hx   . Stomach cancer Neg Hx     Prior to Admission medications   Medication Sig Start Date End Date Taking? Authorizing Provider  acetaminophen (TYLENOL) 500 MG tablet Take 1 tablet (500 mg total) by mouth every 6 (six) hours as needed. Patient taking differently: Take 500 mg by mouth every 6 (six) hours as needed for mild pain.  03/18/18  Yes Bast, Traci A, NP  albuterol (PROAIR HFA) 108 (90 Base) MCG/ACT inhaler Inhale 2 puffs into the lungs every 6 (six) hours as needed for  wheezing or shortness of breath. 09/22/16  Yes Vicie Mutters, PA-C  alendronate (FOSAMAX) 70 MG tablet Take with a full glass of water on an empty stomach. Patient taking differently: Take 70 mg by mouth once a week. Take with a full glass of water on an empty stomach. 02/16/18  Yes Deveshwar, Abel Presto, MD  ALPRAZolam (XANAX) 0.5 MG tablet TAKE ONE-HALF TO ONE TABLET BY MOUTH 2 TO 3 TIMES DAILY ONLY IF ANXIETY ATTACK AND PLEASE TRY TO LIMIT TO 5 DAYS A WEEK TO AVOID ADDICTION Patient taking differently: Take 0.25 mg by mouth 2 (two) times daily as needed for anxiety.  TAKE ONE-HALF TO ONE TABLET BY MOUTH 2 TO 3 TIMES DAILY ONLY IF ANXIETY ATTACK AND PLEASE TRY TO LIMIT TO 5 DAYS A WEEK TO AVOID ADDICTION 11/28/17  Yes [name], NP  amLODipine (NORVASC) 10 MG tablet TAKE 1 TABLET BY MOUTH AT BEDTIME Patient taking differently: Take 10 mg by mouth at bedtime.  09/08/17  Yes [name], MD  aspirin EC 81 MG tablet Take 81 mg by mouth daily.   Yes [provider]  Cholecalciferol (VITAMIN D3) 5000 units CAPS Take 5,000 Units by mouth 2 (two) times daily.    Yes [provider]  diclofenac sodium (VOLTAREN) 1 % GEL Apply 2 g topically 4 (four) times daily. Patient taking differently: Apply 2 g topically 4 (four) times daily as needed (pain).  03/18/18  Yes Bast, Traci A, NP  dicyclomine (BENTYL) 10 MG capsule TAKE 1 CAPSULE BY MOUTH EVERY 8 HOURS AS NEEDED FOR SPASM Patient taking differently: Take 10 mg by mouth 3 (three) times daily as needed for spasms.  02/05/18  Yes Armbruster, Carlota Raspberry, MD  gabapentin (NEURONTIN) 300 MG capsule TAKE 1 CAPSULE BY MOUTH THREE TIMES DAILY FOR  CHRONIC  PAIN. 10/06/17  Yes [name], MD  ipratropium-albuterol (DUONEB) 0.5-2.5 (3) MG/3ML SOLN Take 3 mLs by nebulization every 6 (six) hours as needed (SOB).    Yes [provider]  ketoconazole (NIZORAL) 2 % cream  APPLY ONE APPLICATION TOPICALLY TWO TIMES DAILY Patient taking  differently: Apply 1 application topically 2 (two) times daily as needed for irritation.  04/02/18  Yes [name], MD  losartan (COZAAR) 100 MG tablet TAKE 1 TABLET BY MOUTH ONCE DAILY (DISCONTINUE  BENICAR) Patient taking differently: Take 50 mg by mouth every morning.  02/06/18  Yes [name], MD  MAGNESIUM PO Take 1  tablet by mouth daily.    Yes [provider]  meclizine (ANTIVERT) 25 MG tablet 1/2-1 pill up to 3 times daily for motion sickness/dizziness Patient taking differently: Take 12.5-25 mg by mouth 3 (three) times daily as needed for dizziness. 1/2-1 pill up to 3 times daily for motion sickness/dizziness 06/08/16  Yes Vicie Mutters, PA-C  metoprolol tartrate (LOPRESSOR) 50 MG tablet Take 25 mg by mouth at bedtime.    Yes [provider]  mometasone-formoterol (DULERA) 100-5 MCG/ACT AERO Inhale 2 puffs into the lungs every 12 (twelve) hours. Patient taking differently: Inhale 2 puffs into the lungs every 12 (twelve) hours as needed for wheezing or shortness of breath.  09/08/17  Yes [name], MD  nitroGLYCERIN (NITROSTAT) 0.4 MG SL tablet Place 1 tablet (0.4 mg total) under the tongue every 5 (five) minutes as needed for chest pain. 09/04/14  Yes Larey Dresser, MD  ondansetron (ZOFRAN-ODT) 8 MG disintegrating tablet DISSOLVE ONE TABLET IN MOUTH EVERY 8 HOURS AS NEEDED FOR NAUSEA AND  VOMITING Patient taking differently: Take 8 mg by mouth every 8 (eight) hours as needed for nausea.  10/05/17  Yes [name], NP  OVER THE COUNTER MEDICATION Take 1,000 mg by mouth 2 (two) times daily. [place] Calcium 1000mg    Yes [provider]  oxyCODONE-acetaminophen (PERCOCET/ROXICET) 5-325 MG tablet Take 1 tablet by mouth every 4 (four) hours as needed for severe pain. 03/10/17  Yes Tegeler, Gwenyth Allegra, MD  polyethylene glycol powder (GLYCOLAX/MIRALAX) powder Take 17 g by mouth 2 (two) times daily. Patient taking differently: Take 17 g by mouth daily.   05/16/17  Yes Armbruster, Carlota Raspberry, MD  pravastatin (PRAVACHOL) 40 MG tablet TAKE 1 TABLET BY MOUTH DAILY Patient taking differently: Take 40 mg by mouth daily.  03/14/18  Yes [name], MD  promethazine (PHENERGAN) 25 MG tablet Take 1 tablet (25 mg total) by mouth every 6 (six) hours as needed for nausea or vomiting. 11/11/17  Yes Danford, Suann Larry, MD  senna-docusate (SENOKOT-S) 8.6-50 MG tablet Take 2 tablets by mouth 2 (two) times daily. Patient taking differently: Take 1 tablet by mouth 2 (two) times daily.  12/26/15  Yes Emokpae, Courage, MD  silodosin (RAPAFLO) 8 MG CAPS capsule Take 8 mg by mouth daily with breakfast.    Yes [provider]  tamsulosin (FLOMAX) 0.4 MG CAPS capsule Take 0.4 mg by mouth daily. 03/07/18  Yes [provider]  lubiprostone (AMITIZA) 8 MCG capsule Take 1 capsule (8 mcg total) by mouth 2 (two) times daily with a meal. Patient not taking: Reported on 04/12/2018 12/21/17   Zehr, Laban Emperor, PA-C  omeprazole (PRILOSEC) 20 MG capsule Take 1 capsule (20 mg total) by mouth daily. Patient not taking: Reported on 04/12/2018 01/24/18   Armbruster, Carlota Raspberry, MD    Physical Exam:  Constitutional: Elderly female who appears to be in some distress Vitals:   04/12/18 1630 04/12/18 2015 04/12/18 2030 04/12/18 2100  BP: (!) 179/92 (!) 179/86 (!) 184/92 (!) 168/83  Pulse: 84 89 (!) 101 84  Resp: (!) 36 (!) 22 (!) 22 (!) 23  Temp:      TempSrc:      SpO2: 99% 98% 97% 96%  Weight:      Height:       Eyes: PERRL, lids and conjunctivae normal ENMT: Mucous membranes are moist. Posterior pharynx clear of any exudate or lesions.Normal dentition.  Neck: normal, supple, no masses, no thyromegaly Respiratory: clear to auscultation bilaterally,  no wheezing, no crackles. Normal respiratory effort. No accessory muscle use.  Cardiovascular: Regular rate and rhythm, no murmurs / rubs / gallops. No extremity edema. 2+ pedal pulses. No carotid bruits.  Abdomen: no  tenderness, no masses palpated. No hepatosplenomegaly. Bowel sounds positive.  Musculoskeletal: no clubbing / cyanosis. No joint deformity upper and lower extremities. Good ROM, no contractures. Normal muscle tone.  Skin: no rashes, lesions, ulcers. No induration Neurologic: CN 2-12 grossly intact. Sensation intact, DTR normal. Strength 5/5 in all 4.  Psychiatric: Normal judgment and insight. Alert and oriented x 3.  Anxious mood.     Labs on Admission: I have personally reviewed following labs and imaging studies  CBC: Recent Labs  Lab 04/12/18 1544  WBC 8.1  HGB 15.5*  HCT 48.1*  MCV 96.0  PLT 384   Basic Metabolic Panel: Recent Labs  Lab 04/12/18 1544 04/12/18 1557  NA 136  --   K 5.3*  --   CL 105  --   CO2 15*  --   GLUCOSE 122*  --   BUN 11  --   CREATININE 0.80  --   CALCIUM 9.4  --   MG  --  2.0   GFR: Estimated Creatinine Clearance: 55.3 mL/min (by C-G formula based on SCr of 0.8 mg/dL). Liver Function Tests: Recent Labs  Lab 04/12/18 1544  AST 42*  ALT 18  ALKPHOS 59  BILITOT 1.5*  PROT 8.0  ALBUMIN 4.3   Recent Labs  Lab 04/12/18 1544  LIPASE 20   No results for input(s): AMMONIA in the last 168 hours. Coagulation Profile: No results for input(s): INR, PROTIME in the last 168 hours. Cardiac Enzymes: No results for input(s): CKTOTAL, CKMB, CKMBINDEX, TROPONINI in the last 168 hours. BNP (last 3 results) No results for input(s): PROBNP in the last 8760 hours. HbA1C: No results for input(s): HGBA1C in the last 72 hours. CBG: Recent Labs  Lab 04/12/18 1625  GLUCAP 116*   Lipid Profile: No results for input(s): CHOL, HDL, LDLCALC, TRIG, CHOLHDL, LDLDIRECT in the last 72 hours. Thyroid Function Tests: No results for input(s): TSH, T4TOTAL, FREET4, T3FREE, THYROIDAB in the last 72 hours. Anemia Panel: No results for input(s): VITAMINB12, FOLATE, FERRITIN, TIBC, IRON, RETICCTPCT in the last 72 hours. Urine analysis:    Component Value  Date/Time   COLORURINE YELLOW 04/12/2018 1548   APPEARANCEUR HAZY (A) 04/12/2018 1548   LABSPEC 1.017 04/12/2018 1548   PHURINE 8.0 04/12/2018 1548   GLUCOSEU NEGATIVE 04/12/2018 1548   HGBUR NEGATIVE 04/12/2018 1548   BILIRUBINUR NEGATIVE 04/12/2018 1548   KETONESUR 20 (A) 04/12/2018 1548   PROTEINUR 30 (A) 04/12/2018 1548   UROBILINOGEN 0.2 09/19/2014 2235   NITRITE NEGATIVE 04/12/2018 1548   LEUKOCYTESUR NEGATIVE 04/12/2018 1548   Sepsis Labs: No results found for this or any previous visit (from the past 240 hour(s)).   Radiological Exams on Admission: No results found.  EKG: Independently reviewed.  Normal sinus rhythm 85 bpm  Assessment/Plan  Intractable nausea and vomiting with intermittent diarrhea: acute on chronic.  Patient appears afebrile with normal white blood cell count.  Review of records showed similar symptoms previously in the past for which symptoms are question to be related to medication versus gastroenteritis.  Patient followed in the outpatient setting with gastroenterology Dr. Havery Moros. - Admit to a telemetry bed - Monitor intake and output - Antiemetics as needed - IV fluids normal saline at 75 mL/h - Check acute abdominal series - Hold stool softeners  -  Consider need of consult to GI in a.m.  History dysphasia, hiatal hernia with previous fundoplication, history of esophageal strictures - Protonix IV  Hyperkalemia: Acute.  His potassium noted to be mildly elevated at 5.3. - Continue IV fluids as seen above - Recheck potassium in a.m.  Intermittent asthma, without exacerbation - Continue Dulera - Albuterol nebs as needed  Chronic pain syndrome: Patient with history of chronic back pain - Continue oxycodone and gabapentin as needed  Hypertension: Uncontrolled.  Initial blood pressure was elevated up to 184/92 on admission. - Continue amlodipine, losartan, and metoprolol  Hyperlipidemia - Continue to pravastatin  Anxiety - Ativan IV as  needed - Restart oral medications when able  DVT prophylaxis: lovenox Code Status: Full  Family Communication: no family present  Disposition Plan: Likely discharge home once medically stable Consults called: none Admission status: observation  Norval Morton MD [facility] Pager [phone]   If 7PM-7AM, please contact night-coverage [email or web address] Password [facility]  04/12/2018, 10:49 PM

## 2018-04-12 NOTE — ED Notes (Signed)
Pt was seen here today by Dr Meda Coffee. Pt presents with constant vomiting and abdominal pain x 2 or more days.

## 2018-04-12 NOTE — ED Provider Notes (Signed)
[facility] EMERGENCY DEPARTMENT Provider Note   CSN: [id number] Arrival date & time: 04/12/18  1516     History   Chief Complaint Chief Complaint  Patient presents with  . Nausea  . Emesis    HPI [name] [name] is a 82 y.o. female.  The history is provided by the patient and the spouse.  Emesis   This is a new problem. The current episode started yesterday. The problem occurs 5 to 10 times per day. The problem has been gradually worsening. The emesis has an appearance of stomach contents. There has been no fever. Associated symptoms include arthralgias and diarrhea. Pertinent negatives include no abdominal pain, no chills, no cough, no fever, no headaches, no sweats and no URI. Risk factors include suspect food intake.    Past Medical History:  Diagnosis Date  . Anxiety    takes Xanax daily as needed  . Asthma    Albuterol daily as needed  . Blood transfusion without reported diagnosis   . CAD (coronary artery disease)    LHC 2003 with 40% pLAD, 30% mLAD, 100% D1 (moderate vessel), 100%  D2 (small vessel), 95% mid small OM2.  Pt had PTCA to D1;  D2 and OM2 small vessels and tx medically;  Last Myoview (2012): anterior infarct seen with scar, no ischemia. EF normal. Med Rx // Myoview (12/14):  Low risk; ant scar w/ peri-infarct ischemia; EF 55% with ant AK // Myoview 5/16: EF 55-65, ant, apical scar, no ischemia   . Cataract   . Chronic back pain    HNP, DDD, spinal stenosis, vertebral compression fractures.   . Chronic nausea    takes Zofran daily as needed.  normal gastric emptying study 03/2013  . CKD (chronic kidney disease), stage III ([place]) 09/27/2017  . Constipation    felt to be functional. takes Senokot and Miralax daily.  treated with Linzess in past.   . DIVERTICULOSIS, COLON 08/28/2008   Qualifier: Diagnosis of  By: [name], [name], [name]    . Elevated LFTs 2015   probably med induced DILI, from Augmentin vs Pravastatin  . GERD (gastroesophageal  reflux disease)    hx of esophageal stricture   . Hiatal hernia    Paraesophageal hernias. s/p fundoplications in 6789 and 04/2013  . History of bronchitis several of yrs ago  . History of colon polyps 03/2009, 03/2011   adenomatous colon polyps, no high grade dysplasia.   [name] History of vertigo    no meds  . Hyperlipidemia    takes Pravastatin daily  . Hypertension    takes Amlodipine,Metoprolol,and Losartan daily  . Nausea 03/2016  . Osteoporosis   . PONV (postoperative nausea and vomiting)    yrs ago  . Slow urinary stream    takes Rapaflo daily  . TIA (transient ischemic attack) 1995   no residual effects noted    Patient Active Problem List   Diagnosis Date Noted  . Abnormal glucose 03/01/2018  . FHx: heart disease 03/01/2018  . Therapeutic opioid-induced constipation ([facility]IC) 12/21/2017  . Abnormal UGI series 12/21/2017  . CKD (chronic kidney disease) stage 3, GFR 30-59 ml/min (HCC) 09/27/2017  . T12 compression fracture ([place]) 09/26/2017  . Chronic bronchitis ([place]) 08/09/2017  . Atherosclerosis of aorta ([place]) 04/05/2017  . Costochondritis 11/14/2016  . Anxiety state 06/08/2016  . Spondylolisthesis, lumbar region 04/13/2016  . Dysphagia   . Lumbar radiculopathy, acute 12/07/2015    Class: Chronic  . Spinal stenosis, lumbar region, with neurogenic claudication  05/29/2015  . DDD (degenerative disc disease), lumbar 04/21/2015  . Hyperlipidemia, mixed 11/24/2014  . Gastroesophageal reflux disease 11/24/2014  . Depression, major, in partial remission ([place]) 09/26/2014  . Chronic pain syndrome 09/26/2014  . Coronary artery disease due to lipid rich plaque   . Osteoporosis 02/10/2014  . Prediabetes 08/14/2013  . Vitamin D deficiency 08/14/2013  . Medication management 08/14/2013  . Constipation, chronic 04/03/2013  . MGUS (monoclonal gammopathy of unknown significance) 03/05/2013  . Herniated lumbar intervertebral disc 06/25/2012    Class: Acute  . Vertebral compression  fracture ([place]) 06/23/2012  . ESOPHAGEAL STRICTURE 08/28/2008  . Mild intermittent asthma without complication 35/36/1443  . Incarcerated recurrent paraesophageal hiatal hernia s/p lap redo repair [id number] 01/31/2008  . Essential hypertension 07/25/2007    Past Surgical History:  Procedure Laterality Date  . APPENDECTOMY     patient unsure of date  . BACK SURGERY    . BLADDER REPAIR    . CHOLECYSTECTOMY     Patient unsure of date.  . CORONARY ANGIOPLASTY  11/16/2001   1 stent  . ESOPHAGEAL MANOMETRY N/A 03/25/2013   Procedure: ESOPHAGEAL MANOMETRY (EM);  Surgeon: Inda Castle, MD;  Location: [facility] END[facility];  Service: Endoscopy;  Laterality: N/A;  . ESOPHAGOGASTRODUODENOSCOPY N/A 03/15/2013   Procedure: ESOPHAGOGASTRODUODENOSCOPY (EGD);  Surgeon: Jerene Bears, MD;  Location: [facility] ENDOSCOPY;  Service: Endoscopy;  Laterality: N/A;  . ESOPHAGOGASTRODUODENOSCOPY N/A 12/25/2015   Procedure: ESOPHAGOGASTRODUODENOSCOPY (EGD);  Surgeon: Doran Stabler, MD;  Location: [facility] ENDOSCOPY;  Service: Endoscopy;  Laterality: N/A;  . EYE SURGERY  11/2000   bilateral cataracts with lens implant  . FIXATION KYPHOPLASTY LUMBAR SPINE  X 2   "L3-4"  . HEMORRHOID SURGERY    . HIATAL HERNIA REPAIR  06/03/2011   Procedure: LAPAROSCOPIC REPAIR OF HIATAL HERNIA;  Surgeon: Adin Hector, MD;  Location: [facility] ORS;  Service: General;  Laterality: N/A;  . HIATAL HERNIA REPAIR N/A 04/24/2013   Procedure: LAPAROSCOPIC  REPAIR RECURRENT PARASOPHAGEAL HIATAL HERNIA WITH FUNDOPLICATION;  Surgeon: Adin Hector, MD;  Location: [facility] ORS;  Service: General;  Laterality: N/A;  . INSERTION OF MESH N/A 04/24/2013   Procedure: INSERTION OF MESH;  Surgeon: Adin Hector, MD;  Location: [facility] ORS;  Service: General;  Laterality: N/A;  . IR KYPHO THORACIC WITH BONE BIOPSY  09/29/2017  . IR KYPHO THORACIC WITH BONE BIOPSY  11/03/2017  . LAPAROSCOPIC LYSIS OF ADHESIONS N/A 04/24/2013   Procedure: LAPAROSCOPIC LYSIS OF ADHESIONS;   Surgeon: Adin Hector, MD;  Location: [facility] ORS;  Service: General;  Laterality: N/A;  . LAPAROSCOPIC NISSEN FUNDOPLICATION  7/61/9509   Procedure: LAPAROSCOPIC NISSEN FUNDOPLICATION;  Surgeon: Adin Hector, MD;  Location: [facility] ORS;  Service: General;  Laterality: N/A;  . LUMBAR FUSION  12/07/2015   Right L2-3 facetectomy, posterolateral fusion L2-3 with pedicle screws, rods, local bone graft, Vivigen cancellous chips. Fusion extended to the L1 level with pedicle screws and rods  . LUMBAR LAMINECTOMY/DECOMPRESSION MICRODISCECTOMY Right 06/26/2012   Procedure: LUMBAR LAMINECTOMY/DECOMPRESSION MICRODISCECTOMY;  Surgeon: Jessy Oto, MD;  Location: [facility];  Service: Orthopedics;  Laterality: Right;  RIGHT L4-5 MICRODISCECTOMY  . LUMBAR LAMINECTOMY/DECOMPRESSION MICRODISCECTOMY N/A 05/29/2015   Procedure: RIGHT L2-3 MICRODISCECTOMY;  Surgeon: Jessy Oto, MD;  Location: [facility];  Service: Orthopedics;  Laterality: N/A;  . TOTAL ABDOMINAL HYSTERECTOMY  1975   partial  . UPPER GASTROINTESTINAL ENDOSCOPY       OB History   None  Home Medications    Prior to Admission medications   Medication Sig Start Date End Date Taking? Authorizing Provider  acetaminophen (TYLENOL) 500 MG tablet Take 1 tablet (500 mg total) by mouth every 6 (six) hours as needed. 03/18/18   Loura Halt A, NP  albuterol (PROAIR HFA) 108 (90 Base) MCG/ACT inhaler Inhale 2 puffs into the lungs every 6 (six) hours as needed for wheezing or shortness of breath. 09/22/16   Vicie Mutters, PA-C  alendronate (FOSAMAX) 70 MG tablet Take with a full glass of water on an empty stomach. 02/16/18   Deveshwar, Abel Presto, MD  ALPRAZolam (XANAX) 0.5 MG tablet TAKE ONE-HALF TO ONE TABLET BY MOUTH 2 TO 3 TIMES DAILY ONLY IF ANXIETY ATTACK AND PLEASE TRY TO LIMIT TO 5 DAYS A WEEK TO AVOID ADDICTION 11/28/17   [name], NP  amLODipine (NORVASC) 10 MG tablet TAKE 1 TABLET BY MOUTH AT BEDTIME 09/08/17   [name], MD  aspirin EC 81  MG tablet Take 81 mg by mouth daily.    [provider]  Cholecalciferol (VITAMIN D3) 5000 units CAPS Take 5,000 Units by mouth 2 (two) times daily.     [provider]  diclofenac sodium (VOLTAREN) 1 % GEL Apply 2 g topically 4 (four) times daily. 03/18/18   Bast, Tressia Miners A, NP  dicyclomine (BENTYL) 10 MG capsule TAKE 1 CAPSULE BY MOUTH EVERY 8 HOURS AS NEEDED FOR SPASM 02/05/18   Armbruster, Carlota Raspberry, MD  gabapentin (NEURONTIN) 300 MG capsule TAKE 1 CAPSULE BY MOUTH THREE TIMES DAILY FOR  CHRONIC  PAIN. 10/06/17   [name], MD  ipratropium-albuterol (DUONEB) 0.5-2.5 (3) MG/3ML SOLN Take 3 mLs by nebulization every 6 (six) hours as needed.    [provider]  ketoconazole (NIZORAL) 2 % cream  APPLY ONE APPLICATION TOPICALLY TWO TIMES DAILY 04/02/18   [name], MD  losartan (COZAAR) 100 MG tablet TAKE 1 TABLET BY MOUTH ONCE DAILY (DISCONTINUE  BENICAR) 02/06/18   [name], MD  lubiprostone (AMITIZA) 8 MCG capsule Take 1 capsule (8 mcg total) by mouth 2 (two) times daily with a meal. 12/21/17   Zehr, Laban Emperor, PA-C  MAGNESIUM PO Take by mouth daily.    [provider]  meclizine (ANTIVERT) 25 MG tablet 1/2-1 pill up to 3 times daily for motion sickness/dizziness Patient taking differently: Take 12.5-25 mg by mouth 3 (three) times daily as needed for dizziness. 1/2-1 pill up to 3 times daily for motion sickness/dizziness 06/08/16   Vicie Mutters, PA-C  metoprolol tartrate (LOPRESSOR) 50 MG tablet Take 50 mg by mouth at bedtime.    [provider]  mometasone-formoterol (DULERA) 100-5 MCG/ACT AERO Inhale 2 puffs into the lungs every 12 (twelve) hours. Patient taking differently: Inhale 2 puffs into the lungs every 12 (twelve) hours as needed for wheezing or shortness of breath.  09/08/17   [name], MD  nitroGLYCERIN (NITROSTAT) 0.4 MG SL tablet Place 1 tablet (0.4 mg total) under the tongue every 5 (five) minutes as needed for chest  pain. 09/04/14   Larey Dresser, MD  omeprazole (PRILOSEC) 20 MG capsule Take 1 capsule (20 mg total) by mouth daily. 01/24/18   Armbruster, Carlota Raspberry, MD  ondansetron (ZOFRAN-ODT) 8 MG disintegrating tablet DISSOLVE ONE TABLET IN MOUTH EVERY 8 HOURS AS NEEDED FOR NAUSEA AND  VOMITING 10/05/17   [name], NP  OVER THE COUNTER MEDICATION Take 1,000 mg by mouth 2 (two) times daily. [place] Calcium 1000mg   [provider]  oxyCODONE-acetaminophen (PERCOCET/ROXICET) 5-325 MG tablet Take 1 tablet by mouth every 4 (four) hours as needed for severe pain. 03/10/17   Tegeler, Gwenyth Allegra, MD  polyethylene glycol powder (GLYCOLAX/MIRALAX) powder Take 17 g by mouth 2 (two) times daily. Patient taking differently: Take 17 g by mouth 2 (two) times daily as needed for mild constipation or moderate constipation.  05/16/17   Armbruster, Carlota Raspberry, MD  pravastatin (PRAVACHOL) 40 MG tablet TAKE 1 TABLET BY MOUTH DAILY 03/14/18   [name], MD  promethazine (PHENERGAN) 25 MG tablet Take 1 tablet (25 mg total) by mouth every 6 (six) hours as needed for nausea or vomiting. 11/11/17   Danford, Suann Larry, MD  senna-docusate (SENOKOT-S) 8.6-50 MG tablet Take 2 tablets by mouth 2 (two) times daily. 12/26/15   Roxan Hockey, MD  silodosin (RAPAFLO) 8 MG CAPS capsule Take 8 mg by mouth daily with breakfast.     [provider]  tamsulosin (FLOMAX) 0.4 MG CAPS capsule Take 0.4 mg by mouth daily. 03/07/18   [provider]    Family History Family History  Problem Relation Age of Onset  . Colon cancer Mother 59  . Heart attack Father 54       heart attack  . Brain cancer Brother        tumor   . Heart disease Brother   . Heart disease Brother   . Kidney disease Daughter   . Esophageal cancer Neg Hx   . Stomach cancer Neg Hx     Social History Social History   Tobacco Use  . Smoking status: Never Smoker  . Smokeless tobacco: Never Used  Substance Use Topics  . Alcohol  use: No  . Drug use: Never     Allergies   Ace inhibitors; Aspartame; Atorvastatin; Biaxin [clarithromycin]; Daliresp [roflumilast]; Erythromycin; and Levofloxacin   Review of Systems Review of Systems  Constitutional: Positive for activity change, appetite change and fatigue. Negative for chills and fever.  HENT: Negative for ear pain and sore throat.   Eyes: Negative for pain and visual disturbance.  Respiratory: Negative for cough and shortness of breath.   Cardiovascular: Negative for chest pain and palpitations.  Gastrointestinal: Positive for diarrhea and vomiting. Negative for abdominal pain.  Genitourinary: Negative for dysuria and hematuria.  Musculoskeletal: Positive for arthralgias and back pain.  Skin: Negative for color change and rash.  Neurological: Negative for seizures, syncope and headaches.  All other systems reviewed and are negative.    Physical Exam Updated Vital Signs BP (!) 168/83   Pulse 84   Temp 98.3 F (36.8 C) (Oral)   Resp (!) 23   Ht 5\' 4"  (1.626 m)   Wt 73.9 kg   SpO2 96%   BMI 27.98 kg/m   Physical Exam  Constitutional: She appears well-developed and well-nourished. No distress.  HENT:  Head: Normocephalic and atraumatic.  Eyes: Conjunctivae are normal.  Neck: Neck supple.  Cardiovascular: Normal rate and regular rhythm.  No murmur heard. Pulmonary/Chest: Effort normal and breath sounds normal. No respiratory distress.  Abdominal: Soft. There is no tenderness.  Musculoskeletal: She exhibits no edema.  Neurological: She is alert.  Skin: Skin is warm and dry.  Psychiatric: She has a normal mood and affect.  Nursing note and vitals reviewed.    ED Treatments / Results  Labs (all labs ordered are listed, but only abnormal results are displayed) Labs Reviewed  COMPREHENSIVE METABOLIC PANEL - Abnormal; Notable for the following components:  Result Value   Potassium 5.3 (*)    CO2 15 (*)    Glucose, Bld 122 (*)    AST 42  (*)    Total Bilirubin 1.5 (*)    Anion gap 16 (*)    All other components within normal limits  CBC - Abnormal; Notable for the following components:   Hemoglobin 15.5 (*)    HCT 48.1 (*)    All other components within normal limits  URINALYSIS, COMPLETE (UACMP) WITH MICROSCOPIC - Abnormal; Notable for the following components:   APPearance HAZY (*)    Ketones, ur 20 (*)    Protein, ur 30 (*)    Bacteria, UA FEW (*)    All other components within normal limits  CBG MONITORING, ED - Abnormal; Notable for the following components:   Glucose-Capillary 116 (*)    All other components within normal limits  LIPASE, BLOOD  MAGNESIUM  URINALYSIS, ROUTINE W REFLEX MICROSCOPIC  I-STAT TROPONIN, ED    EKG EKG Interpretation  Date/Time:  Thursday April 12 2018 16:36:10 EST Ventricular Rate:  85 PR Interval:  134 QRS Duration: 84 QT Interval:  354 QTC Calculation: 421 R Axis:   -41 Text Interpretation:  Normal sinus rhythm Left axis deviation Low voltage QRS Septal infarct , age undetermined Possible Lateral infarct , age undetermined Abnormal ECG No acute changes Confirmed by [name] [phone]) on 04/12/2018 6:25:02 PM   Radiology No results found.  Procedures Procedures (including critical care time)  Medications Ordered in ED Medications  metoCLOPramide (REGLAN) injection 10 mg (10 mg Intravenous Given 04/12/18 1651)  sodium chloride 0.9 % bolus 1,000 mL (0 mLs Intravenous Stopped 04/12/18 1951)  promethazine (PHENERGAN) tablet 12.5 mg (12.5 mg Oral Given 04/12/18 1735)  sodium chloride 0.9 % bolus 1,000 mL (1,000 mLs Intravenous New Bag/Given 04/12/18 2005)  ondansetron (ZOFRAN) injection 4 mg (4 mg Intravenous Given 04/12/18 2010)  haloperidol lactate (HALDOL) injection 2 mg (2 mg Intravenous Given 04/12/18 2026)     Initial Impression / Assessment and Plan / ED Course  I have reviewed the triage vital signs and the nursing notes.  Pertinent labs & imaging results  that were available during my care of the patient were reviewed by me and considered in my medical decision making (see chart for details).     Patient is a 82 year old female with past medical history significant for coronary artery disease, chronic back pain presenting for inability to tolerate p.o. and multiple episodes of nausea vomiting, diarrhea since yesterday concerning for gastroenteritis.  Patient states that she has had nonbilious nonbloody vomiting and diarrhea that has been unresolved with p.o. Phenergan at home.  Patient denies abdominal tenderness, physical exam unremarkable.  Does note that abdominal pain occurs when she does vomit. Patient given 1 L of normal saline bolus, IV Reglan and p.o. Phenergan. Upon reassessment, patient continues to have nausea and vomiting.  Patient given additional liter of fluids, IV Zofran and Haldol.  Patient states she still feels nauseated.  Continues to not complain of abdominal pain. Labs remarkable for elevated anion gap and low bicarb. No indication for emergent CT scan at this time as patient adamantly denies over upon reassessment abdominal pain, exam continues to be unremarkable. Patient will be admitted for further evaluation and management she cannot tolerate p.o.  Final Clinical Impressions(s) / ED Diagnoses   Final diagnoses:  Intractable vomiting with nausea, unspecified vomiting type    ED Discharge Orders    None  Erskine Squibb, MD 04/12/18 2123    [name], MD 04/13/18 [id number]

## 2018-04-12 NOTE — ED Triage Notes (Signed)
Pt cc constant vomiting x 2 days or more.

## 2018-04-13 DIAGNOSIS — Z8719 Personal history of other diseases of the digestive system: Secondary | ICD-10-CM | POA: Diagnosis not present

## 2018-04-13 DIAGNOSIS — Z9889 Other specified postprocedural states: Secondary | ICD-10-CM | POA: Diagnosis not present

## 2018-04-13 DIAGNOSIS — R112 Nausea with vomiting, unspecified: Secondary | ICD-10-CM

## 2018-04-13 DIAGNOSIS — J4520 Mild intermittent asthma, uncomplicated: Secondary | ICD-10-CM | POA: Diagnosis not present

## 2018-04-13 DIAGNOSIS — E875 Hyperkalemia: Secondary | ICD-10-CM

## 2018-04-13 DIAGNOSIS — I10 Essential (primary) hypertension: Secondary | ICD-10-CM | POA: Diagnosis not present

## 2018-04-13 LAB — BLOOD GAS, ARTERIAL
Acid-base deficit: 1.9 mmol/L (ref 0.0–2.0)
Bicarbonate: 22.6 mmol/L (ref 20.0–28.0)
Drawn by: 283401
FIO2: 0.21
O2 Saturation: 94.5 %
Patient temperature: 98.6
pCO2 arterial: 40.4 mmHg (ref 32.0–48.0)
pH, Arterial: 7.366 (ref 7.350–7.450)
pO2, Arterial: 69.9 mmHg — ABNORMAL LOW (ref 83.0–108.0)

## 2018-04-13 LAB — CBC
HCT: 44.4 % (ref 36.0–46.0)
HEMATOCRIT: 42.3 % (ref 36.0–46.0)
Hemoglobin: 14.7 g/dL (ref 12.0–15.0)
Hemoglobin: 13.4 g/dL (ref 12.0–15.0)
MCH: 31.5 pg (ref 26.0–34.0)
MCH: 30.2 pg (ref 26.0–34.0)
MCHC: 33.1 g/dL (ref 30.0–36.0)
MCHC: 31.7 g/dL (ref 30.0–36.0)
MCV: 95.1 fL (ref 80.0–100.0)
MCV: 95.3 fL (ref 80.0–100.0)
Platelets: 300 10*3/uL (ref 150–400)
Platelets: 293 10*3/uL (ref 150–400)
RBC: 4.67 MIL/uL (ref 3.87–5.11)
RBC: 4.44 MIL/uL (ref 3.87–5.11)
RDW: 13.6 % (ref 11.5–15.5)
RDW: 13.6 % (ref 11.5–15.5)
WBC: 9.4 10*3/uL (ref 4.0–10.5)
WBC: 7.7 10*3/uL (ref 4.0–10.5)
nRBC: 0 % (ref 0.0–0.2)
nRBC: 0 % (ref 0.0–0.2)

## 2018-04-13 LAB — BASIC METABOLIC PANEL
Anion gap: 12 (ref 5–15)
Anion gap: 7 (ref 5–15)
BUN: 8 mg/dL (ref 8–23)
BUN: 10 mg/dL (ref 8–23)
CO2: 19 mmol/L — ABNORMAL LOW (ref 22–32)
CO2: 23 mmol/L (ref 22–32)
CREATININE: 0.84 mg/dL (ref 0.44–1.00)
Calcium: 8.5 mg/dL — ABNORMAL LOW (ref 8.9–10.3)
Calcium: 8.2 mg/dL — ABNORMAL LOW (ref 8.9–10.3)
Chloride: 106 mmol/L (ref 98–111)
Chloride: 105 mmol/L (ref 98–111)
Creatinine, Ser: 0.77 mg/dL (ref 0.44–1.00)
GFR calc Af Amer: >60 mL/min (ref >60)
GFR calc Af Amer: >60 mL/min (ref >60)
GFR calc non Af Amer: >60 mL/min (ref >60)
GFR calc non Af Amer: >60 mL/min (ref >60)
Glucose, Bld: 133 mg/dL — ABNORMAL HIGH (ref 70–99)
Glucose, Bld: 92 mg/dL (ref 70–99)
Potassium: 3.9 mmol/L (ref 3.5–5.1)
Potassium: 3.6 mmol/L (ref 3.5–5.1)
Sodium: 137 mmol/L (ref 135–145)
Sodium: 135 mmol/L (ref 135–145)

## 2018-04-13 LAB — GLUCOSE, CAPILLARY: Glucose-Capillary: 86 mg/dL (ref 70–99)

## 2018-04-13 MED ORDER — ONDANSETRON HCL 4 MG/2ML IJ SOLN
4.0000 mg | INTRAMUSCULAR | Status: DC
  Administered 2018-04-13 – 2018-04-14 (×5): 4 mg via INTRAVENOUS
  Filled 2018-04-13 (×5): qty 2

## 2018-04-13 MED ORDER — OXYCODONE-ACETAMINOPHEN 5-325 MG PO TABS: 1.0000 | ORAL_TABLET | ORAL | Status: DC | PRN

## 2018-04-13 MED ORDER — DICLOFENAC SODIUM 1 % TD GEL
2.0000 g | Freq: Four times a day (QID) | TRANSDERMAL | Status: DC | PRN
  Filled 2018-04-13: qty 100

## 2018-04-13 MED ORDER — PROCHLORPERAZINE EDISYLATE 10 MG/2ML IJ SOLN
10.0000 mg | Freq: Four times a day (QID) | INTRAMUSCULAR | Status: DC | PRN
  Administered 2018-04-13: 10 mg via INTRAVENOUS
  Filled 2018-04-13: qty 2

## 2018-04-13 MED ORDER — CALCIUM GLUCONATE-NACL 1-0.675 GM/50ML-% IV SOLN
1.0000 g | Freq: Once | INTRAVENOUS | Status: AC
  Administered 2018-04-13: 1000 mg via INTRAVENOUS
  Filled 2018-04-13: qty 50

## 2018-04-13 MED ORDER — PANTOPRAZOLE SODIUM 40 MG IV SOLR
40.0000 mg | Freq: Once | INTRAVENOUS | Status: AC
  Administered 2018-04-13: 40 mg via INTRAVENOUS
  Filled 2018-04-13: qty 40

## 2018-04-13 MED ORDER — METOCLOPRAMIDE HCL 5 MG/ML IJ SOLN
10.0000 mg | Freq: Once | INTRAMUSCULAR | Status: AC
  Administered 2018-04-13: 10 mg via INTRAVENOUS
  Filled 2018-04-13: qty 2

## 2018-04-13 MED ORDER — MORPHINE SULFATE (PF) 2 MG/ML IV SOLN
2.0000 mg | Freq: Once | INTRAVENOUS | Status: AC
  Administered 2018-04-13: 2 mg via INTRAVENOUS
  Filled 2018-04-13: qty 1

## 2018-04-13 MED ORDER — SODIUM CHLORIDE 0.9 % IV SOLN
INTRAVENOUS | Status: AC
  Administered 2018-04-13: 12:00:00 via INTRAVENOUS

## 2018-04-13 MED ORDER — LORAZEPAM 2 MG/ML IJ SOLN
0.5000 mg | INTRAMUSCULAR | Status: AC
  Administered 2018-04-13: 0.5 mg via INTRAVENOUS
  Filled 2018-04-13: qty 1

## 2018-04-13 MED ORDER — LORAZEPAM 2 MG/ML IJ SOLN: 0.5000 mg | Freq: Four times a day (QID) | INTRAMUSCULAR | Status: DC | PRN

## 2018-04-13 MED ORDER — GABAPENTIN 300 MG PO CAPS
300.0000 mg | ORAL_CAPSULE | Freq: Three times a day (TID) | ORAL | Status: DC
  Administered 2018-04-13 – 2018-04-14 (×3): 300 mg via ORAL
  Filled 2018-04-13 (×4): qty 1

## 2018-04-13 MED ORDER — NALOXONE HCL 0.4 MG/ML IJ SOLN
INTRAMUSCULAR | Status: AC
  Administered 2018-04-13: 0.4 mg
  Filled 2018-04-13: qty 1

## 2018-04-13 NOTE — Plan of Care (Signed)
Patient's systolic blood pressure was greater than 180. Was given IV hydralazine 10 mg. After administered patient's systolic blood pressure decreased to 160's. Will continue to monitor for patient safety.

## 2018-04-13 NOTE — Code Documentation (Signed)
Rapid Response Team Event Note:  I was called by [facility] staff about patient having an irregular heart rhythm with bursts of ectopy. Patient was unresponsive per staff. Upon arrival, patient was obtunded, + snoring respirations, not protecting her airway, did not arouse to painful nor verbal stimuli, pupils were 52mm, +CR. I initiated a Code Blue anticipating respiratory failure. Code [employer] arrived.  Patient was given 0.4mg  Narcan IV at 2130 and quickly after, patient became more responsive, pupils now 48mm reactive/brisk.   HR on arrival on [place] ranged from 60-80, frequent ectopy at times, multifocal wide PVCs. + pulses maintained, EKG done. Patient did become more alert, interactive, oriented, and followed all commands. + Left facial asymmetry but no other deficits noted (per patient, + previous mini stroke). Current VS are stable. During the event, oxygen saturations > 98% on RA. [facility] NP was also paged as the Code Blue was paged, NP came to the bedside as well.   Orders - STAT BMP/CBC  Of note: patient has received Percocet, Gabapentin, Ativan, Compazine, and Haldol in the last 24 hours.   Call Time: 2114 Arrival Time: 2115 End Time: 2200

## 2018-04-13 NOTE — Progress Notes (Signed)
[facility] PROGRESS NOTE  [name] [name] ZJQ:[id number] DOB: 03-06-1936 DOA: 04/12/2018 PCP: [name], MD  Assessment/Plan:  Intractable nausea and vomiting with intermittent diarrhea: acute on chronic.  etiology unclear. No s/sx infectious process. Abdominal series negative.  Hx hiatal hernia and nessan fundoplication. Received Haldol, reglan, ativan, morphine compazine during night. Quite drowsy this am. Evaluated by gi who opine recent EGD with dilation. No further episodes query gastroenteritis. -sips of clears tonight per gi -if tolerates advance diet -contine zofran - Monitor intake and output -hopefully discharge in am   History dysphasia, hiatal hernia with previous fundoplication, history of esophageal strictures -see #1 - Protonix IV  Hyperkalemia: resolved.. - Continue IV fluids as seen above - Recheck potassium in a.m.  Intermittent asthma, without exacerbation - Continue Dulera - Albuterol nebs as needed  Chronic pain syndrome: Patient with history of chronic back pain -stable at baseline - Continue oxycodone and gabapentin as needed  Hypertension: fair control.  Initial blood pressure was elevated up to 184/92 on admission. - Continue amlodipine, losartan, and metoprolol  Hyperlipidemia - Continue to pravastatin  Anxiety - Ativan IV as needed - Restart oral medications when able   Code Status: full Family Communication: none present Disposition Plan: home in am   Consultants:  Dr Henrene Pastor gi  Procedures:  none  Antibiotics:  none  HPI/Subjective: [name] [name] is a 82 y.o. female with medical history significant of HTN, HLD, stage III kidney disease, CAD, chronic back pain s/p kyphoplasty, chronic nausea, and severe hiatal hernia with history of fundoplication; who presented on 12/5 with nausea and vomiting.  Symptoms reportedly started after having [name].  Other associated symptoms included complaints of back pain  which appears chronic for which patient takes hydrocodone and reports for loose stools.  Denied having any fever, chest pain, or shortness of breath.  Review of records showed that patient has had previous admissions with similar symptoms last occurring in July of this year.  Patient is followed by Dr. Havery Moros for history of dysphagia    Objective: Vitals:   04/13/18 0548 04/13/18 1200  BP: (!) 167/89 134/64  Pulse: 98 82  Resp: 18 18  Temp: 98.5 F (36.9 C) 99.1 F (37.3 C)  SpO2: 95% 96%    Intake/Output Summary (Last 24 hours) at 04/13/2018 1416 Last data filed at 04/12/2018 2247 Gross per 24 hour  Intake 1000 ml  Output -  Net 1000 ml   Filed Weights   04/12/18 1530 04/13/18 0021  Weight: 73.9 kg 70.1 kg    Exam:   General:  Thin slightly frail somewhat pale in no acute distress  Cardiovascular: rrr no MGR no LE edema  Respiratory: normal effort BS clear bilaterally no wheeze  Abdomen: non-distended non-tender +BS no guarding  Musculoskeletal: joints without swelling/erythema   Data Reviewed: Basic Metabolic Panel: Recent Labs  Lab 04/12/18 1544 04/12/18 1557 04/13/18 0434  NA 136  --  137  K 5.3*  --  3.9  CL 105  --  106  CO2 15*  --  19*  GLUCOSE 122*  --  133*  BUN 11  --  8  CREATININE 0.80  --  0.84  CALCIUM 9.4  --  8.5*  MG  --  2.0  --    Liver Function Tests: Recent Labs  Lab 04/12/18 1544  AST 42*  ALT 18  ALKPHOS 59  BILITOT 1.5*  PROT 8.0  ALBUMIN 4.3   Recent Labs  Lab 04/12/18 1544  LIPASE 20   No results for input(s): AMMONIA in the last 168 hours. CBC: Recent Labs  Lab 04/12/18 1544 04/13/18 0434  WBC 8.1 9.4  HGB 15.5* 14.7  HCT 48.1* 44.4  MCV 96.0 95.1  PLT 336 300   Cardiac Enzymes: No results for input(s): CKTOTAL, CKMB, CKMBINDEX, TROPONINI in the last 168 hours. BNP (last 3 results) Recent Labs    09/25/17 1843  BNP 106.3*    ProBNP (last 3 results) No results for input(s): PROBNP in the last  8760 hours.  CBG: Recent Labs  Lab 04/12/18 1625  GLUCAP 116*    No results found for this or any previous visit (from the past 240 hour(s)).   Studies: Dg Abd Acute W/chest  Result Date: 04/12/2018 CLINICAL DATA:  Intractable nausea and vomiting for a day EXAM: DG ABDOMEN ACUTE W/ 1V CHEST COMPARISON:  None. FINDINGS: Mild cardiomegaly. Minimal aortic atherosclerosis. No aneurysm. Clear lungs. No free air beneath the diaphragm. Bowel gas pattern is unremarkable without obstructive pattern. Phleboliths are present bilaterally within the pelvis. Kyphoplasties at T11, T12 L2, L3 and L4 with spinal fusion hardware from L1 through L3. Cholecystectomy clips are present. IMPRESSION: Negative abdominal radiographs. No acute cardiopulmonary disease. Electronically Signed   By: Ashley Royalty M.D.   On: 04/12/2018 23:52    Scheduled Meds: . amLODipine  10 mg Oral QHS  . aspirin EC  81 mg Oral Daily  . enoxaparin (LOVENOX) injection  40 mg Subcutaneous Daily  . gabapentin  300 mg Oral TID  . losartan  50 mg Oral Daily  . metoprolol tartrate  25 mg Oral QHS  . mometasone-formoterol  2 puff Inhalation BID  . ondansetron (ZOFRAN) IV  4 mg Intravenous Q4H  . pravastatin  40 mg Oral Daily  . tamsulosin  0.4 mg Oral Daily   Continuous Infusions: . sodium chloride 50 mL/hr at 04/13/18 1157    Principal Problem:   Intractable nausea and vomiting Active Problems:   Essential hypertension   Hyperkalemia   Mild intermittent asthma without complication   Chronic pain syndrome   Hyperlipidemia, mixed   Anxiety state    Time spent: 40 minutes    Radene Gunning NP [facility]  If 7PM-7AM, please contact night-coverage at [email or web address], password [email or web address] 04/13/2018, 2:16 PM  LOS: 0 days

## 2018-04-13 NOTE — Progress Notes (Signed)
Called to the bedside for a code blue. According to bedside RN and rapid response pt became apneic and unresponsive. Pt was given 1 dose of narcan and pt was back at baseline. Review of chart shows that pt has had percocet, meclizine, ativan, gabapentin, and morphine within the past 24 hours. On assessment, pt was A/O x4, VSS, she denies any chest pain/discomfort, N/V, SOB and physical exam unremarkable. Advised to admit to telemetry. Continue to monitor.    Unresponsive - Narcan IV given - Admit to telemetry -Discontinue all sedatives at this time.   [name] Newcomer, NP 7P-7A [facility]  [phone]

## 2018-04-13 NOTE — Consult Note (Addendum)
Referring Provider:  Luane School, NP with [facility] Primary Care Physician:  [name], MD Primary Gastroenterologist:  Dr. Havery Moros  Reason for Consultation:  Vomiting  HPI: [name] [name] is a 82 y.o. female with medical history significant of HTN, HLD, stage III kidney disease, CAD, chronic back pain s/p kyphoplasty, chronic nausea, and severe hiatal hernia with history of fundoplication.  She presented to the ED at [facility] with complaints nausea and vomiting.  she and her husband tell me that her symptoms started after having [name] on Wed evening.  Reports say that she had several episodes of non-bloody emesis but she tells me that she never actually vomited any material, that it was just severe nausea and dry heaves.  ED Course: Patient was given 1 L normal saline IV fluids, Phenergan, Zofran, Reglan, and Haldol without relief of symptoms.    She tells me that she feels good right now and the last nausea or dry heaves that she's had were last night.  Then received something for nausea and fell asleep, sleeping all night.   EGD 01/2018 aborted due to large amount of food residue in the stomach.  EGD 02/20/2018 showed the following:  - Esophagogastric landmarks identified. - Tortuous esophagus with angulated turn into the fundoplication without obvious stenosis - I suspect this is likely causing the patient's symptoms and barium study findings, likely due to altered anatomy from fundoplication. Balloon dilation performed to the area to 57mm, no mucosal wrents appreciated. - Multiple plaques in the lower third of the esophagus. Brushings performed to rule out Candidiasis. - A Nissen fundoplication was found, the wrap was intact. - Gastritis. Biopsied. - Normal duodenal bulb and second portion of the duodenum.  Was treated with fluconazole for 14 days.  She's had similar issues to these in the past and has different antiemetics at home, but her husband says  that when her symptoms get that severe that she cannot take anything by mouth then he worries about her getting dehydrated so he brought her to the ED.  She denies any current complaints of dysphagia.  Past Medical History:  Diagnosis Date  . Anxiety    takes Xanax daily as needed  . Asthma    Albuterol daily as needed  . Blood transfusion without reported diagnosis   . CAD (coronary artery disease)    LHC 2003 with 40% pLAD, 30% mLAD, 100% D1 (moderate vessel), 100%  D2 (small vessel), 95% mid small OM2.  Pt had PTCA to D1;  D2 and OM2 small vessels and tx medically;  Last Myoview (2012): anterior infarct seen with scar, no ischemia. EF normal. Med Rx // Myoview (12/14):  Low risk; ant scar w/ peri-infarct ischemia; EF 55% with ant AK // Myoview 5/16: EF 55-65, ant, apical scar, no ischemia   . Cataract   . Chronic back pain    HNP, DDD, spinal stenosis, vertebral compression fractures.   . Chronic nausea    takes Zofran daily as needed.  normal gastric emptying study 03/2013  . CKD (chronic kidney disease), stage III ([place]) 09/27/2017  . Constipation    felt to be functional. takes Senokot and Miralax daily.  treated with Linzess in past.   . DIVERTICULOSIS, COLON 08/28/2008   Qualifier: Diagnosis of  By: [name], [name], [name]    . Elevated LFTs 2015   probably med induced DILI, from Augmentin vs Pravastatin  . GERD (gastroesophageal reflux disease)    hx of esophageal stricture   .  Hiatal hernia    Paraesophageal hernias. s/p fundoplications in 6301 and 04/2013  . History of bronchitis several of yrs ago  . History of colon polyps 03/2009, 03/2011   adenomatous colon polyps, no high grade dysplasia.   [name] History of vertigo    no meds  . Hyperlipidemia    takes Pravastatin daily  . Hypertension    takes Amlodipine,Metoprolol,and Losartan daily  . Nausea 03/2016  . Osteoporosis   . PONV (postoperative nausea and vomiting)    yrs ago  . Slow urinary stream    takes Rapaflo daily   . TIA (transient ischemic attack) 1995   no residual effects noted    Past Surgical History:  Procedure Laterality Date  . APPENDECTOMY     patient unsure of date  . BACK SURGERY    . BLADDER REPAIR    . CHOLECYSTECTOMY     Patient unsure of date.  . CORONARY ANGIOPLASTY  11/16/2001   1 stent  . ESOPHAGEAL MANOMETRY N/A 03/25/2013   Procedure: ESOPHAGEAL MANOMETRY (EM);  Surgeon: Inda Castle, MD;  Location: [facility];  Service: Endoscopy;  Laterality: N/A;  . ESOPHAGOGASTRODUODENOSCOPY N/A 03/15/2013   Procedure: ESOPHAGOGASTRODUODENOSCOPY (EGD);  Surgeon: Jerene Bears, MD;  Location: [facility] ENDOSCOPY;  Service: Endoscopy;  Laterality: N/A;  . ESOPHAGOGASTRODUODENOSCOPY N/A 12/25/2015   Procedure: ESOPHAGOGASTRODUODENOSCOPY (EGD);  Surgeon: Doran Stabler, MD;  Location: [facility] ENDOSCOPY;  Service: Endoscopy;  Laterality: N/A;  . EYE SURGERY  11/2000   bilateral cataracts with lens implant  . FIXATION KYPHOPLASTY LUMBAR SPINE  X 2   "L3-4"  . HEMORRHOID SURGERY    . HIATAL HERNIA REPAIR  06/03/2011   Procedure: LAPAROSCOPIC REPAIR OF HIATAL HERNIA;  Surgeon: Adin Hector, MD;  Location: [facility] ORS;  Service: General;  Laterality: N/A;  . HIATAL HERNIA REPAIR N/A 04/24/2013   Procedure: LAPAROSCOPIC  REPAIR RECURRENT PARASOPHAGEAL HIATAL HERNIA WITH FUNDOPLICATION;  Surgeon: Adin Hector, MD;  Location: [facility]S;  Service: General;  Laterality: N/A;  . INSERTION OF MESH N/A 04/24/2013   Procedure: INSERTION OF MESH;  Surgeon: Adin Hector, MD;  Location: [facility] ORS;  Service: General;  Laterality: N/A;  . IR KYPHO THORACIC WITH BONE BIOPSY  09/29/2017  . IR KYPHO THORACIC WITH BONE BIOPSY  11/03/2017  . LAPAROSCOPIC LYSIS OF ADHESIONS N/A 04/24/2013   Procedure: LAPAROSCOPIC LYSIS OF ADHESIONS;  Surgeon: Adin Hector, MD;  Location: [facility] ORS;  Service: General;  Laterality: N/A;  . LAPAROSCOPIC NISSEN FUNDOPLICATION  10/08/930   Procedure: LAPAROSCOPIC NISSEN FUNDOPLICATION;   Surgeon: Adin Hector, MD;  Location: [facility];  Service: General;  Laterality: N/A;  . LUMBAR FUSION  12/07/2015   Right L2-3 facetectomy, posterolateral fusion L2-3 with pedicle screws, rods, local bone graft, Vivigen cancellous chips. Fusion extended to the L1 level with pedicle screws and rods  . LUMBAR LAMINECTOMY/DECOMPRESSION MICRODISCECTOMY Right 06/26/2012   Procedure: LUMBAR LAMINECTOMY/DECOMPRESSION MICRODISCECTOMY;  Surgeon: Jessy Oto, MD;  Location: [place];  Service: Orthopedics;  Laterality: Right;  RIGHT L4-5 MICRODISCECTOMY  . LUMBAR LAMINECTOMY/DECOMPRESSION MICRODISCECTOMY N/A 05/29/2015   Procedure: RIGHT L2-3 MICRODISCECTOMY;  Surgeon: Jessy Oto, MD;  Location: [place];  Service: Orthopedics;  Laterality: N/A;  . TOTAL ABDOMINAL HYSTERECTOMY  1975   partial  . UPPER GASTROINTESTINAL ENDOSCOPY      Prior to Admission medications   Medication Sig Start Date End Date Taking? Authorizing Provider  acetaminophen (TYLENOL) 500 MG tablet Take 1 tablet (500 mg total)  by mouth every 6 (six) hours as needed. Patient taking differently: Take 500 mg by mouth every 6 (six) hours as needed for mild pain.  03/18/18  Yes Bast, Traci A, NP  albuterol (PROAIR HFA) 108 (90 Base) MCG/ACT inhaler Inhale 2 puffs into the lungs every 6 (six) hours as needed for wheezing or shortness of breath. 09/22/16  Yes Vicie Mutters, PA-C  alendronate (FOSAMAX) 70 MG tablet Take with a full glass of water on an empty stomach. Patient taking differently: Take 70 mg by mouth once a week. Take with a full glass of water on an empty stomach. 02/16/18  Yes Deveshwar, Abel Presto, MD  ALPRAZolam (XANAX) 0.5 MG tablet TAKE ONE-HALF TO ONE TABLET BY MOUTH 2 TO 3 TIMES DAILY ONLY IF ANXIETY ATTACK AND PLEASE TRY TO LIMIT TO 5 DAYS A WEEK TO AVOID ADDICTION Patient taking differently: Take 0.25 mg by mouth 2 (two) times daily as needed for anxiety.  TAKE ONE-HALF TO ONE TABLET BY MOUTH 2 TO 3 TIMES DAILY ONLY IF ANXIETY  ATTACK AND PLEASE TRY TO LIMIT TO 5 DAYS A WEEK TO AVOID ADDICTION 11/28/17  Yes [name], NP  amLODipine (NORVASC) 10 MG tablet TAKE 1 TABLET BY MOUTH AT BEDTIME Patient taking differently: Take 10 mg by mouth at bedtime.  09/08/17  Yes [name], MD  aspirin EC 81 MG tablet Take 81 mg by mouth daily.   Yes [provider]  Cholecalciferol (VITAMIN D3) 5000 units CAPS Take 5,000 Units by mouth 2 (two) times daily.    Yes [provider]  diclofenac sodium (VOLTAREN) 1 % GEL Apply 2 g topically 4 (four) times daily. Patient taking differently: Apply 2 g topically 4 (four) times daily as needed (pain).  03/18/18  Yes Bast, Traci A, NP  dicyclomine (BENTYL) 10 MG capsule TAKE 1 CAPSULE BY MOUTH EVERY 8 HOURS AS NEEDED FOR SPASM Patient taking differently: Take 10 mg by mouth 3 (three) times daily as needed for spasms.  02/05/18  Yes Armbruster, Carlota Raspberry, MD  gabapentin (NEURONTIN) 300 MG capsule TAKE 1 CAPSULE BY MOUTH THREE TIMES DAILY FOR  CHRONIC  PAIN. 10/06/17  Yes [name], MD  ipratropium-albuterol (DUONEB) 0.5-2.5 (3) MG/3ML SOLN Take 3 mLs by nebulization every 6 (six) hours as needed (SOB).    Yes [provider]  ketoconazole (NIZORAL) 2 % cream  APPLY ONE APPLICATION TOPICALLY TWO TIMES DAILY Patient taking differently: Apply 1 application topically 2 (two) times daily as needed for irritation.  04/02/18  Yes [name], MD  losartan (COZAAR) 100 MG tablet TAKE 1 TABLET BY MOUTH ONCE DAILY (DISCONTINUE  BENICAR) Patient taking differently: Take 50 mg by mouth every morning.  02/06/18  Yes [name], MD  MAGNESIUM PO Take 1 tablet by mouth daily.    Yes [provider]  meclizine (ANTIVERT) 25 MG tablet 1/2-1 pill up to 3 times daily for motion sickness/dizziness Patient taking differently: Take 12.5-25 mg by mouth 3 (three) times daily as needed for dizziness. 1/2-1 pill up to 3 times daily for motion sickness/dizziness  06/08/16  Yes Vicie Mutters, PA-C  metoprolol tartrate (LOPRESSOR) 50 MG tablet Take 25 mg by mouth at bedtime.    Yes [provider]  mometasone-formoterol (DULERA) 100-5 MCG/ACT AERO Inhale 2 puffs into the lungs every 12 (twelve) hours. Patient taking differently: Inhale 2 puffs into the lungs every 12 (twelve) hours as needed for wheezing or shortness of breath.  09/08/17  Yes [name], MD  nitroGLYCERIN (NITROSTAT) 0.4 MG SL tablet Place 1 tablet (0.4 mg total) under the tongue every 5 (five) minutes as needed for chest pain. 09/04/14  Yes Larey Dresser, MD  ondansetron (ZOFRAN-ODT) 8 MG disintegrating tablet DISSOLVE ONE TABLET IN MOUTH EVERY 8 HOURS AS NEEDED FOR NAUSEA AND  VOMITING Patient taking differently: Take 8 mg by mouth every 8 (eight) hours as needed for nausea.  10/05/17  Yes [name], NP  OVER THE COUNTER MEDICATION Take 1,000 mg by mouth 2 (two) times daily. [place] Calcium 1000mg    Yes [provider]  oxyCODONE-acetaminophen (PERCOCET/ROXICET) 5-325 MG tablet Take 1 tablet by mouth every 4 (four) hours as needed for severe pain. 03/10/17  Yes Tegeler, Gwenyth Allegra, MD  polyethylene glycol powder (GLYCOLAX/MIRALAX) powder Take 17 g by mouth 2 (two) times daily. Patient taking differently: Take 17 g by mouth daily.  05/16/17  Yes Armbruster, Carlota Raspberry, MD  pravastatin (PRAVACHOL) 40 MG tablet TAKE 1 TABLET BY MOUTH DAILY Patient taking differently: Take 40 mg by mouth daily.  03/14/18  Yes [name], MD  promethazine (PHENERGAN) 25 MG tablet Take 1 tablet (25 mg total) by mouth every 6 (six) hours as needed for nausea or vomiting. 11/11/17  Yes Danford, Suann Larry, MD  senna-docusate (SENOKOT-S) 8.6-50 MG tablet Take 2 tablets by mouth 2 (two) times daily. Patient taking differently: Take 1 tablet by mouth 2 (two) times daily.  12/26/15  Yes Emokpae, Courage, MD  silodosin (RAPAFLO) 8 MG CAPS capsule Take 8 mg by mouth daily with breakfast.     Yes [provider]  tamsulosin (FLOMAX) 0.4 MG CAPS capsule Take 0.4 mg by mouth daily. 03/07/18  Yes [provider]  lubiprostone (AMITIZA) 8 MCG capsule Take 1 capsule (8 mcg total) by mouth 2 (two) times daily with a meal. Patient not taking: Reported on 04/12/2018 12/21/17   Zehr, Laban Emperor, PA-C  omeprazole (PRILOSEC) 20 MG capsule Take 1 capsule (20 mg total) by mouth daily. Patient not taking: Reported on 04/12/2018 01/24/18   Armbruster, Carlota Raspberry, MD    Current Facility-Administered Medications  Medication Dose Route Frequency Provider Last Rate Last Dose  . 0.9 %  sodium chloride infusion   Intravenous Continuous Black, Lezlie Octave, NP      . acetaminophen (TYLENOL) tablet 650 mg  650 mg Oral Q6H PRN Norval Morton, MD       Or  . acetaminophen (TYLENOL) suppository 650 mg  650 mg Rectal Q6H PRN Tamala Julian, Rondell A, MD      . amLODipine (NORVASC) tablet 10 mg  10 mg Oral QHS Smith, Rondell A, MD      . aspirin EC tablet 81 mg  81 mg Oral Daily Smith, Rondell A, MD   81 mg at 04/13/18 1047  . dicyclomine (BENTYL) capsule 10 mg  10 mg Oral TID PRN Fuller Plan A, MD      . enoxaparin (LOVENOX) injection 40 mg  40 mg Subcutaneous Daily Smith, Rondell A, MD      . gabapentin (NEURONTIN) capsule 300 mg  300 mg Oral TID Fuller Plan A, MD   300 mg at 04/13/18 1048  . hydrALAZINE (APRESOLINE) injection 10 mg  10 mg Intravenous Q4H PRN Fuller Plan A, MD   10 mg at 04/13/18 0043  . ipratropium-albuterol (DUONEB) 0.5-2.5 (3) MG/3ML nebulizer solution 3 mL  3 mL Nebulization Q6H PRN Smith, Rondell A, MD      . losartan (COZAAR) tablet 50 mg  50 mg Oral Daily Tamala Julian, Rondell A, MD   50 mg at 04/13/18 1048  . meclizine (ANTIVERT) tablet 12.5-25 mg  12.5-25 mg Oral TID PRN Fuller Plan A, MD      . metoprolol tartrate (LOPRESSOR) tablet 25 mg  25 mg Oral QHS Smith, Rondell A, MD      . mometasone-formoterol (DULERA) 100-5 MCG/ACT inhaler 2 puff  2 puff Inhalation BID Fuller Plan A, MD   2 puff at 04/13/18 0916  . ondansetron (ZOFRAN) injection 4 mg  4 mg Intravenous Q4H Radene Gunning, NP   4 mg at 04/13/18 0915  . oxyCODONE-acetaminophen (PERCOCET/ROXICET) 5-325 MG per tablet 1 tablet  1 tablet Oral Q4H PRN Smith, Rondell A, MD      . pravastatin (PRAVACHOL) tablet 40 mg  40 mg Oral Daily Smith, Rondell A, MD   40 mg at 04/13/18 1048  . prochlorperazine (COMPAZINE) injection 10 mg  10 mg Intravenous Q6H PRN Fuller Plan A, MD   10 mg at 04/13/18 0538  . tamsulosin (FLOMAX) capsule 0.4 mg  0.4 mg Oral Daily Tamala Julian, Rondell A, MD   0.4 mg at 04/13/18 1049    Allergies as of 04/12/2018 - Review Complete 04/12/2018  Allergen Reaction Noted  . Ace inhibitors Cough 04/03/2013  . Aspartame Diarrhea and Nausea And Vomiting   . Atorvastatin Other (See Comments) 09/04/2014  . Biaxin [clarithromycin] Other (See Comments) 03/07/2014  . Daliresp [roflumilast] Nausea And Vomiting 05/23/2011  . Erythromycin Other (See Comments) 03/07/2014  . Levofloxacin Nausea And Vomiting 11/04/2013    Family History  Problem Relation Age of Onset  . Colon cancer Mother 64  . Heart attack Father 61       heart attack  . Brain cancer Brother        tumor   . Heart disease Brother   . Heart disease Brother   . Kidney disease Daughter   . Esophageal cancer Neg Hx   . Stomach cancer Neg Hx     Social History   Socioeconomic History  . Marital status: Married    Spouse name: [name]  . Number of children: 2  . Years of education: 67  . Highest education level: Not on file  Occupational History  . Occupation: Retired    Fish farm manager: RETIRED    Comment: worked at [employer]  . Financial resource strain: Not on file  . Food insecurity:    Worry: Not on file    Inability: Not on file  . Transportation needs:    Medical: Not on file    Non-medical: Not on file  Tobacco Use  . Smoking status: Never Smoker  . Smokeless tobacco: Never Used  Substance  and Sexual Activity  . Alcohol use: No  . Drug use: Never  . Sexual activity: Not on file  Lifestyle  . Physical activity:    Days per week: Not on file    Minutes per session: Not on file  . Stress: Not on file  Relationships  . Social connections:    Talks on phone: Not on file    Gets together: Not on file    Attends religious service: Not on file    Active member of club or organization: Not on file    Attends meetings of clubs or organizations: Not on file    Relationship status: Not on file  . Intimate partner violence:    Fear of current or ex partner: Not on file  Emotionally abused: Not on file    Physically abused: Not on file    Forced sexual activity: Not on file  Other Topics Concern  . Not on file  Social History Narrative   Lives with husband.   Right-handed.   3 cups caffeine per day.    Review of Systems: ROS is O/W negative except as mentioned in HPI.  Physical Exam: Vital signs in last 24 hours: Temp:  [98.2 F (36.8 C)-98.5 F (36.9 C)] 98.5 F (36.9 C) (12/06 0548) Pulse Rate:  [81-101] 98 (12/06 0548) Resp:  [15-36] 18 (12/06 0548) BP: (153-184)/(57-101) 167/89 (12/06 0548) SpO2:  [94 %-99 %] 95 % (12/06 0548) Weight:  [70.1 kg-74.4 kg] 70.1 kg (12/06 0021) Last BM Date: 04/13/18 General:  Alert, Well-developed, well-nourished, pleasant and cooperative in NAD Head:  Normocephalic and atraumatic. Eyes:  Sclera clear, no icterus.  Conjunctiva pink. Ears:  Normal auditory acuity. Mouth:  No deformity or lesions.   Lungs:  Clear throughout to auscultation.  No wheezes, crackles, or rhonchi.  Heart:  Regular rate and rhythm; no murmurs, clicks, rubs, or gallops. Abdomen:  Soft, non-distended.  BS present.  Non-tender. Msk:  Symmetrical without gross deformities. Pulses:  Normal pulses noted. Extremities:  Without clubbing or edema. Neurologic:  Alert and oriented x 4;  grossly normal neurologically. Skin:  Intact without significant lesions  or rashes. Psych:  Alert and cooperative. Normal mood and affect.  Intake/Output from previous day: 12/05 0701 - 12/06 0700 In: 1000 [IV Piggyback:1000] Out: -   Lab Results: Recent Labs    04/12/18 1544 04/13/18 0434  WBC 8.1 9.4  HGB 15.5* 14.7  HCT 48.1* 44.4  PLT 336 300   BMET Recent Labs    04/12/18 1544 04/13/18 0434  NA 136 137  K 5.3* 3.9  CL 105 106  CO2 15* 19*  GLUCOSE 122* 133*  BUN 11 8  CREATININE 0.80 0.84  CALCIUM 9.4 8.5*   LFT Recent Labs    04/12/18 1544  PROT 8.0  ALBUMIN 4.3  AST 42*  ALT 18  ALKPHOS 59  BILITOT 1.5*   Studies/Results: Dg Abd Acute W/chest  Result Date: 04/12/2018 CLINICAL DATA:  Intractable nausea and vomiting for a day EXAM: DG ABDOMEN ACUTE W/ 1V CHEST COMPARISON:  None. FINDINGS: Mild cardiomegaly. Minimal aortic atherosclerosis. No aneurysm. Clear lungs. No free air beneath the diaphragm. Bowel gas pattern is unremarkable without obstructive pattern. Phleboliths are present bilaterally within the pelvis. Kyphoplasties at T11, T12 L2, L3 and L4 with spinal fusion hardware from L1 through L3. Cholecystectomy clips are present. IMPRESSION: Negative abdominal radiographs. No acute cardiopulmonary disease. Electronically Signed   By: Ashley Royalty M.D.   On: 04/12/2018 23:52   IMPRESSION:  *Nausea and dry heaves:  Sudden onset Wednesday.  Has had similar issues in the past but usually associated with dysphagia.  Just had EGD with dilation in October and has been doing well.  Says that she never actually vomited but severe dry heaves and feeling of inability to take anything PO.  Feels better now and no nausea or dry heaves since yesterday.  Had to know what caused this.  ? Gastroenteritis.  PLAN: -Will allow sips of clears from the floor and if she tolerates it then can have a clear liquid tray and diet can be advanced as tolerated to soft diet. -Continue prn antiemetics for now.  Laban Emperor. Zehr  04/13/2018, 11:56 AM  GI  ATTENDING  History, laboratories, x-rays, prior  endoscopy reports reviewed.  Patient personally seen and examined.  Agree with comprehensive consultation note as outlined above.  Patient with prior paraesophageal hernia repair with team.  Recent evaluations with Dr. Havery Moros dysphasia and nausea.  Presents now with seemingly self-limited dry heaves after [name].  Looks well exam benign.  Agree with advancing diet as tolerated.  She should have antiemetics at home for on-demand use.  Hopefully she can be discharged tomorrow.  No indication for upper endoscopy.  Results from recent upper endoscopy in October reviewed.  She may follow-up with outpatient with Armbruster as needed.  Please call for questions or problems.  We will sign off.  Docia Chuck. Geri Seminole., M.D. [facility] Division of Gastroenterology

## 2018-04-13 NOTE — Progress Notes (Addendum)
Levada Dy RN entered room and found patient with decreased consciousness. Nurse sternal rubbed patient with little response. Radial pulse 3+ reported by nurse. PT not able to follow commands. Tele monitor showed EKG changes (monitor read short episodes of v-fibb)  @0914  Rapid Response PUJA RN  @0914  Levada Dy RN still in room, placed patient on AED pads, crash cart at bedside. NS fluid bolus infusing  @0916  Code blue called, [place] PA notified and paged  @2121  EKG performed om patient. No change from baseline, pulse ox. 92% on RA. IV team arrived, respiratory team arrived, provide at bedside, pharmacist at bedside, phlebotomy at bedside   @2130  0.4mg  of Narcan administered by rapid response. Medication effective. Pt alert , able to talk in full complete sentences and following commands.   @2126  BP 120/67 , HR 65 bpm, 92% RA  @2133  BP 125/108 HR 77 bpm, 98% RA  @2136  BP 148/75 HR 75 bpm 98% RA  Provider placing orders. Patient to be transferred

## 2018-04-13 NOTE — Progress Notes (Addendum)
NURSING PROGRESS NOTE  [name] [id number] Admission Data: 04/13/2018 7:45 AM Attending Provider: Geradine Girt, DO PYK:[id number], Gwyndolyn Saxon, MD Code Status: Full  [name] [name] is a 82 y.o. female patient admitted from ED:  -No acute distress noted.  -No complaints of shortness of breath.  -No complaints of chest pain.   Cardiac Monitoring:  Cardiac monitor yields:normal sinus rhythm.  Blood pressure (!) 167/89, pulse 98, temperature 98.5 F (36.9 C), temperature source Oral, resp. rate 18, height 5\' 4"  (1.626 m), weight 70.1 kg, SpO2 95 %.   IV Fluids:  IV in place, occlusive dsg intact without redness, I antecubital right, condition patent and no redness NSL  Allergies:  Ace inhibitors; Aspartame; Atorvastatin; Biaxin [clarithromycin]; Daliresp [roflumilast]; Erythromycin; and Levofloxacin  Past Medical History:   has a past medical history of Anxiety, Asthma, Blood transfusion without reported diagnosis, CAD (coronary artery disease), Cataract, Chronic back pain, Chronic nausea, CKD (chronic kidney disease), stage III ([place]) (09/27/2017), Constipation, DIVERTICULOSIS, COLON (08/28/2008), Elevated LFTs (2015), GERD (gastroesophageal reflux disease), Hiatal hernia, History of bronchitis (several of yrs ago), History of colon polyps (03/2009, 03/2011), History of vertigo, Hyperlipidemia, Hypertension, Nausea (03/2016), Osteoporosis, PONV (postoperative nausea and vomiting), Slow urinary stream, and TIA (transient ischemic attack) (1995).  Past Surgical History:   has a past surgical history that includes Back surgery; Bladder repair; Hemorrhoid surgery; Cholecystectomy; Appendectomy; Total abdominal hysterectomy (1975); Eye surgery (11/2000); Hiatal hernia repair (06/03/2011); Laparoscopic Nissen fundoplication (09/11/3974); Lumbar laminectomy/decompression microdiscectomy (Right, 06/26/2012); Esophagogastroduodenoscopy (N/A, 03/15/2013); Esophageal manometry (N/A, 03/25/2013); Hiatal hernia  repair (N/A, 04/24/2013); Insertion of mesh (N/A, 04/24/2013); Laparoscopic lysis of adhesions (N/A, 04/24/2013); Fixation kyphoplasty lumbar spine (X 2); Coronary angioplasty (11/16/2001); Lumbar laminectomy/decompression microdiscectomy (N/A, 05/29/2015); Lumbar fusion (12/07/2015); Esophagogastroduodenoscopy (N/A, 12/25/2015); IR KYPHO THORACIC WITH BONE BIOPSY (09/29/2017); IR KYPHO THORACIC WITH BONE BIOPSY (11/03/2017); and Upper gastrointestinal endoscopy.  Social History:   reports that she has never smoked. She has never used smokeless tobacco. She reports that she does not drink alcohol or use drugs.  Skin: Patient has MOSD in her abd skin folds. Powder applied to sites.  Patient/Family orientated to room. Information packet given to patient/family. Admission inpatient armband information verified with patient/family to include name and date of birth and placed on patient arm. Side rails up x 2, fall assessment and education completed with patient/family. Patient/family able to verbalize understanding of risk associated with falls and verbalized understanding to call for assistance before getting out of bed. Call light within reach. Patient/family able to voice and demonstrate understanding of unit orientation instructions.    Will continue to evaluate and treat per MD orders.

## 2018-04-14 DIAGNOSIS — I10 Essential (primary) hypertension: Secondary | ICD-10-CM | POA: Diagnosis not present

## 2018-04-14 DIAGNOSIS — R112 Nausea with vomiting, unspecified: Secondary | ICD-10-CM | POA: Diagnosis not present

## 2018-04-14 DIAGNOSIS — R4189 Other symptoms and signs involving cognitive functions and awareness: Secondary | ICD-10-CM | POA: Diagnosis present

## 2018-04-14 DIAGNOSIS — J4520 Mild intermittent asthma, uncomplicated: Secondary | ICD-10-CM

## 2018-04-14 LAB — COMPREHENSIVE METABOLIC PANEL
ALT: 16 U/L (ref 0–44)
AST: 20 U/L (ref 15–41)
Albumin: 3.1 g/dL — ABNORMAL LOW (ref 3.5–5.0)
Alkaline Phosphatase: 44 U/L (ref 38–126)
Anion gap: 6 (ref 5–15)
BUN: 8 mg/dL (ref 8–23)
CO2: 23 mmol/L (ref 22–32)
Calcium: 8.4 mg/dL — ABNORMAL LOW (ref 8.9–10.3)
Chloride: 111 mmol/L (ref 98–111)
Creatinine, Ser: 0.8 mg/dL (ref 0.44–1.00)
GFR calc Af Amer: >60 mL/min (ref >60)
GFR calc non Af Amer: >60 mL/min (ref >60)
Glucose, Bld: 105 mg/dL — ABNORMAL HIGH (ref 70–99)
Potassium: 3.6 mmol/L (ref 3.5–5.1)
Sodium: 140 mmol/L (ref 135–145)
Total Bilirubin: 1.1 mg/dL (ref 0.3–1.2)
Total Protein: 6 g/dL — ABNORMAL LOW (ref 6.5–8.1)

## 2018-04-14 LAB — CBC
HCT: 40.1 % (ref 36.0–46.0)
Hemoglobin: 12.8 g/dL (ref 12.0–15.0)
MCH: 30.3 pg (ref 26.0–34.0)
MCHC: 31.9 g/dL (ref 30.0–36.0)
MCV: 94.8 fL (ref 80.0–100.0)
Platelets: 296 10*3/uL (ref 150–400)
RBC: 4.23 MIL/uL (ref 3.87–5.11)
RDW: 13.6 % (ref 11.5–15.5)
WBC: 6.3 10*3/uL (ref 4.0–10.5)
nRBC: 0 % (ref 0.0–0.2)

## 2018-04-14 MED ORDER — LOSARTAN POTASSIUM 50 MG PO TABS: 50.0000 mg | ORAL_TABLET | Freq: Every day | ORAL | 1 refills | Status: DC

## 2018-04-14 NOTE — Evaluation (Signed)
Physical Therapy Evaluation Patient Details Name: [name] [name] MRN: [id number] DOB: [birth year] Today's Date: 04/14/2018   History of Present Illness  Pt is a 82 y.o. female with a PMH consisting of HTN, HLD, stage III kidney disease, CAD, chronic back pain, and a TIA presents to the ED with nausea and vomiting. Abdominal/chest x-ray showed negative abdominal radiographs, and no acute cardiopulmonary disease. Rapid response team called on 12/6 due to irregular heart rhythm with bursts of ectopy, with Narcan being administered.     Clinical Impression  Pt presents supine, HOB elevated, and alert. Pt states willingness to participate in PT. Prior to admission, was modified independent with all activities, requiring the use of AD in the form of a RW and cane for ambulation, and needing no assistance with ADL's.  Pt lives in a 1 story house with three steps to enter with her husband, who is able to provide support 24/7. Pt is overall supervision for bed mobility and transfers, while min guard for ambulation. Pt displays decreased ability to perform cognitive tasks during ambulation, as well as decreased safety awareness. Pt would continue to benefit from acute PT in order to increase safety awareness, balance, and functional mobility.    Follow Up Recommendations No PT follow up    Equipment Recommendations  None recommended by PT    Recommendations for Other Services       Precautions / Restrictions Precautions Precautions: None Restrictions Weight Bearing Restrictions: No      Mobility  Bed Mobility Overal bed mobility: Needs Assistance Bed Mobility: Supine to Sit     Supine to sit: Supervision     General bed mobility comments: for safety  Transfers Overall transfer level: Needs assistance Equipment used: None Transfers: Sit to/from Stand Sit to Stand: Supervision         General transfer comment: for safety  Ambulation/Gait Ambulation/Gait assistance: Min  guard Gait Distance (Feet): 275 Feet Assistive device: Rolling walker (2 wheeled) Gait Pattern/deviations: Step-through pattern     General Gait Details: Min guard given for safety. Pt ambulated at increased walking speed. Pt required multimodal cueing for body placement within the walker. Pt demonstrated difficulty completing a cognitive task during ambulation consisting of naming animals that stated with the letter B, naming only 1, as well as the letter D, naming only 2.   Stairs            Wheelchair Mobility    Modified Rankin (Stroke Patients Only)       Balance Overall balance assessment: Needs assistance Sitting-balance support: Feet supported Sitting balance-Leahy Scale: Good     Standing balance support: No upper extremity supported Standing balance-Leahy Scale: Fair Standing balance comment: Pt required BUE support during ambulation, but not standing static balance.               High level balance activites: Head turns High Level Balance Comments: Pt able to complete vertical and horizontal head turns without decreasing speed or instances of LOB.              Pertinent Vitals/Pain Pain Assessment: No/denies pain    Home Living Family/patient expects to be discharged to:: Private residence Living Arrangements: Spouse/significant other Available Help at Discharge: Available 24 hours/day;Family Type of Home: House Home Access: Stairs to enter Entrance Stairs-Rails: None Entrance Stairs-Number of Steps: 3 in back Home Layout: One level Home Equipment: Walker - 2 wheels;Walker - 4 wheels;Shower seat;Grab bars - tub/shower;Grab bars - toilet  Prior Function Level of Independence: Independent with assistive device(s)         Comments: using SPC or RW      Hand Dominance   Dominant Hand: Right    Extremity/Trunk Assessment   Upper Extremity Assessment Upper Extremity Assessment: Overall WFL for tasks assessed    Lower Extremity  Assessment Lower Extremity Assessment: Overall WFL for tasks assessed       Communication   Communication: 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  Cognition Arousal/Alertness: Awake/alert Behavior During Therapy: Anxious;Impulsive Overall Cognitive Status: No family/caregiver present to determine baseline cognitive functioning Area of Impairment: Following commands;Safety/judgement;Problem solving                       Following Commands: Follows one step commands consistently;Follows one step commands with increased time Safety/Judgement: Decreased awareness of safety   Problem Solving: Difficulty sequencing        General Comments General comments (skin integrity, edema, etc.): Pt HR after ambulation measured 97 BPM.     Exercises     Assessment/Plan    PT Assessment Patient needs continued PT services  PT Problem List Decreased safety awareness;Decreased balance;Decreased activity tolerance;Decreased mobility       PT Treatment Interventions DME instruction;Stair training;Gait training;Functional mobility training;Therapeutic activities;Therapeutic exercise;Balance training;Neuromuscular re-education;Cognitive remediation    PT Goals (Current goals can be found in the Care Plan section)  Acute Rehab PT Goals Patient Stated Goal: to go home PT Goal Formulation: With patient Time For Goal Achievement: 04/28/18 Potential to Achieve Goals: Good    Frequency Min 3X/week   Barriers to discharge        Co-evaluation               AM-PAC PT "6 Clicks" Mobility  Outcome Measure Help needed turning from your back to your side while in a flat bed without using bedrails?: None Help needed moving from lying on your back to sitting on the side of a flat bed without using bedrails?: None Help needed moving to and from a bed to a chair (including a wheelchair)?: None Help needed standing up from a chair using your arms (e.g., wheelchair or bedside chair)?: A Little Help needed to  walk in hospital room?: A Little Help needed climbing 3-5 steps with a railing? : A Little 6 Click Score: 21    End of Session Equipment Utilized During Treatment: Gait belt Activity Tolerance: Patient tolerated treatment well Patient left: in chair;with call bell/phone within reach;with chair alarm set Nurse Communication: Mobility status PT Visit Diagnosis: Unsteadiness on feet (R26.81);Difficulty in walking, not elsewhere classified (R26.2)    Time: 8768-1157 PT Time Calculation (min) (ACUTE ONLY): 18 min   Charges:   PT Evaluation $PT Eval Moderate Complexity: (P) 1 Mod          Rudell Ortman Lawrence, SPT Acute Rehab [phone] (pager) [phone] (office)   Amya Hlad 04/14/2018, 10:10 AM

## 2018-04-14 NOTE — Progress Notes (Signed)
PT Progress Note for Charges    04/14/18 1000  PT General Charges  $$ ACUTE PT VISIT 1 Visit  PT Evaluation  $PT Eval Moderate Complexity 1 Mod  Sherie Don, [place], DPT  Acute Rehabilitation Services Pager [phone] Office [phone]

## 2018-04-14 NOTE — Discharge Summary (Addendum)
Physician Discharge Summary  [name] [name] [name] [name]:[id number] DOB: 10-26-1935 DOA: 04/12/2018  PCP: [name] Pinto, MD  Admit date: 04/12/2018 Discharge date: 04/14/2018  Time spent: 45 minutes  Recommendations for Outpatient Follow-up:  1. Follow up with PCP 1-2 weeks for evaluation of nausea/vomiting  2. Advance diet slowly    Discharge Diagnoses:  Principal Problem:   Intractable nausea and vomiting Active Problems:   Unresponsiveness   Essential hypertension   Hyperkalemia   Mild intermittent asthma without complication   Chronic pain syndrome   Hyperlipidemia, mixed   Anxiety state   Discharge Condition: stable and tolerating full liquids well  Diet recommendation: tolerating full liquids  Filed Weights   04/12/18 1530 04/13/18 0021  Weight: 73.9 kg 70.1 kg    History of present illness:  [name] [name] a 82 y.o.femalewith medical history significant ofHTN, HLD, stage III kidney disease, CAD, chronic back pains/pkyphoplasty, chronic nausea,and severe hiatal hernia with history of fundoplication;who presented on 12/5 with nausea and vomiting. Symptoms reportedly started after having [name] [name]. Other associated symptoms included complaints of back pain which appears chronic for which patient takes hydrocodone and reports for loose stools. Denied having any fever, chest pain, or shortness of breath. Review of records showed that patient has had previous admissions with similar symptoms last occurring in July of this year. Patient is followed by [name] [name] for history of dysphagia  Hospital Course:  Intractable nausea and vomiting with intermittent diarrhea:acute on chronic.  No s/sx infectious process. Abdominal series negative.  Hx hiatal hernia and nessan fundoplication. Evaluated by GI who opine ? Gastroenteritis. Noted EGD in 10/19 with dilation without problem.  Received Haldol, reglan, ativan, morphine compazine during night. Quite drowsy first morning  but alert and oriented in afternoon. Evaluated by gi who opine recent EGD with dilation. -diet advanced as tolerated  Unresponsiveness ?[name] [name] Kitchen On 12/6 code blue called and rapid response team activated for unresponsiveness and ?apnea. Evaluated by rapid response who opined pt smoring and not protecting airway. Patient given Narcan and quickly became more responsive. Never became hypoxic. VS remained stable  History dysphasia, hiatal hernia with previous fundoplication, history of esophageal strictures. see #1  Hyperkalemia:resolved.  Intermittent asthma,without exacerbation. Stable at baseline  Chronic pain syndrome: Patient with history of chronic back pain.Stable at baseline  Hypertension: fair control. Initial blood pressure was elevated up to 184/92 on admission.Continue amlodipine, losartan dose decreased and metoprolol  Hyperlipidemia -Continue to pravastatin  Anxiety. Stable at baseline    Procedures:    Consultations:  [name] [name] GI  Discharge Exam: Vitals:   04/14/18 0040 04/14/18 0410  BP: (!) 166/88 (!) 121/53  Pulse: 80 (!) 59  Resp: 18 18  Temp: 98.1 F (36.7 C) 98.6 F (37 C)  SpO2: 99% 98%    General: sitting in chair eating breakfast alert oriented x3.  Cardiovascular: rrr no mgr no LE edema Respiratory: normal effort BS clear bilaterally no wheeze  Discharge Instructions   Discharge Instructions    Call MD for:  persistant dizziness or light-headedness   Complete by:  As directed    Call MD for:  persistant nausea and vomiting   Complete by:  As directed    Call MD for:  severe uncontrolled pain   Complete by:  As directed    Diet - low sodium heart healthy   Complete by:  As directed    Discharge instructions   Complete by:  As directed    Take medications as directed.  Follow  up with pcp 1-2 weeks   Increase activity slowly   Complete by:  As directed      Allergies as of 04/14/2018      Reactions   Ace Inhibitors Cough    Per Dr Melvyn Novas pulmonology 2013   Aspartame Diarrhea, Nausea And Vomiting   Atorvastatin Other (See Comments)   Muscle aches   Biaxin [clarithromycin] Other (See Comments)   PATIENT CAN TOLERATE Z-PAK.  Is written on patient's paper chart.   Daliresp [roflumilast] Nausea And Vomiting   Erythromycin Other (See Comments)   PATIENT CAN TOLERATE Z-PAK.  It is written on patient's paper chart.   Levofloxacin Nausea And Vomiting      Medication List    STOP taking these medications   omeprazole 20 MG capsule Commonly known as:  PRILOSEC   silodosin 8 MG Caps capsule Commonly known as:  RAPAFLO     TAKE these medications   acetaminophen 500 MG tablet Commonly known as:  TYLENOL Take 1 tablet (500 mg total) by mouth every 6 (six) hours as needed. What changed:  reasons to take this   albuterol 108 (90 Base) MCG/ACT inhaler Commonly known as:  PROVENTIL HFA;VENTOLIN HFA Inhale 2 puffs into the lungs every 6 (six) hours as needed for wheezing or shortness of breath.   alendronate 70 MG tablet Commonly known as:  FOSAMAX Take with a full glass of water on an empty stomach. What changed:    how much to take  how to take this  when to take this   ALPRAZolam 0.5 MG tablet Commonly known as:  XANAX TAKE ONE-HALF TO ONE TABLET BY MOUTH 2 TO 3 TIMES DAILY ONLY IF ANXIETY ATTACK AND PLEASE TRY TO LIMIT TO 5 DAYS A WEEK TO AVOID ADDICTION What changed:    how much to take  how to take this  when to take this  reasons to take this   amLODipine 10 MG tablet Commonly known as:  NORVASC TAKE 1 TABLET BY MOUTH AT BEDTIME   aspirin EC 81 MG tablet Take 81 mg by mouth daily.   diclofenac sodium 1 % Gel Commonly known as:  VOLTAREN Apply 2 g topically 4 (four) times daily. What changed:    when to take this  reasons to take this   dicyclomine 10 MG capsule Commonly known as:  BENTYL TAKE 1 CAPSULE BY MOUTH EVERY 8 HOURS AS NEEDED FOR SPASM What changed:  See the new  instructions.   gabapentin 300 MG capsule Commonly known as:  NEURONTIN TAKE 1 CAPSULE BY MOUTH THREE TIMES DAILY FOR  CHRONIC  PAIN.   ipratropium-albuterol 0.5-2.5 (3) MG/3ML Soln Commonly known as:  DUONEB Take 3 mLs by nebulization every 6 (six) hours as needed (SOB).   ketoconazole 2 % cream Commonly known as:  NIZORAL APPLY ONE APPLICATION TOPICALLY TWO TIMES DAILY What changed:  See the new instructions.   losartan 50 MG tablet Commonly known as:  COZAAR Take 1 tablet (50 mg total) by mouth daily. Start taking on:  04/15/2018 What changed:    medication strength  how much to take  how to take this  when to take this  additional instructions   lubiprostone 8 MCG capsule Commonly known as:  AMITIZA Take 1 capsule (8 mcg total) by mouth 2 (two) times daily with a meal.   MAGNESIUM PO Take 1 tablet by mouth daily.   meclizine 25 MG tablet Commonly known as:  ANTIVERT 1/2-1 pill up to 3  times daily for motion sickness/dizziness What changed:    how much to take  how to take this  when to take this  reasons to take this   metoprolol tartrate 50 MG tablet Commonly known as:  LOPRESSOR Take 25 mg by mouth at bedtime.   mometasone-formoterol 100-5 MCG/ACT Aero Commonly known as:  DULERA Inhale 2 puffs into the lungs every 12 (twelve) hours. What changed:    when to take this  reasons to take this   nitroGLYCERIN 0.4 MG SL tablet Commonly known as:  NITROSTAT Place 1 tablet (0.4 mg total) under the tongue every 5 (five) minutes as needed for chest pain.   ondansetron 8 MG disintegrating tablet Commonly known as:  ZOFRAN-ODT DISSOLVE ONE TABLET IN MOUTH EVERY 8 HOURS AS NEEDED FOR NAUSEA AND  VOMITING What changed:  See the new instructions.   OVER THE COUNTER MEDICATION Take 1,000 mg by mouth 2 (two) times daily. [place] Calcium 1000mg    oxyCODONE-acetaminophen 5-325 MG tablet Commonly known as:  PERCOCET/ROXICET Take 1 tablet by mouth every 4  (four) hours as needed for severe pain.   polyethylene glycol powder powder Commonly known as:  GLYCOLAX/MIRALAX Take 17 g by mouth 2 (two) times daily. What changed:  when to take this   pravastatin 40 MG tablet Commonly known as:  PRAVACHOL TAKE 1 TABLET BY MOUTH DAILY   promethazine 25 MG tablet Commonly known as:  PHENERGAN Take 1 tablet (25 mg total) by mouth every 6 (six) hours as needed for nausea or vomiting.   senna-docusate 8.6-50 MG tablet Commonly known as:  Senokot-S Take 2 tablets by mouth 2 (two) times daily. What changed:  how much to take   tamsulosin 0.4 MG Caps capsule Commonly known as:  FLOMAX Take 0.4 mg by mouth daily.   Vitamin D3 125 MCG (5000 UT) Caps Take 5,000 Units by mouth 2 (two) times daily.      Allergies  Allergen Reactions  . Ace Inhibitors Cough    Per Dr Melvyn Novas pulmonology 2013  . Aspartame Diarrhea and Nausea And Vomiting  . Atorvastatin Other (See Comments)    Muscle aches  . Biaxin [Clarithromycin] Other (See Comments)    PATIENT CAN TOLERATE Z-PAK.  Is written on patient's paper chart.  [name] [Roflumilast] Nausea And Vomiting  . Erythromycin Other (See Comments)    PATIENT CAN TOLERATE Z-PAK.  It is written on patient's paper chart.  . Levofloxacin Nausea And Vomiting      The results of significant diagnostics from this hospitalization (including imaging, microbiology, ancillary and laboratory) are listed below for reference.    Significant Diagnostic Studies: Dg Shoulder Left  Result Date: 03/18/2018 CLINICAL DATA:  Acute left shoulder pain without known injury. EXAM: LEFT SHOULDER - 2+ VIEW COMPARISON:  None. FINDINGS: There is no evidence of fracture or dislocation. Mild degenerative change of left glenohumeral joint is noted. Soft tissues are unremarkable. IMPRESSION: Mild osteoarthritis of left glenohumeral joint. No acute abnormality seen in the left shoulder. Electronically Signed   By: Marijo Conception, M.D.   On:  03/18/2018 17:24   Dg Abd Acute W/chest  Result Date: 04/12/2018 CLINICAL DATA:  Intractable nausea and vomiting for a day EXAM: DG ABDOMEN ACUTE W/ 1V CHEST COMPARISON:  None. FINDINGS: Mild cardiomegaly. Minimal aortic atherosclerosis. No aneurysm. Clear lungs. No free air beneath the diaphragm. Bowel gas pattern is unremarkable without obstructive pattern. Phleboliths are present bilaterally within the pelvis. Kyphoplasties at T11, T12 L2, L3 and  L4 with spinal fusion hardware from L1 through L3. Cholecystectomy clips are present. IMPRESSION: Negative abdominal radiographs. No acute cardiopulmonary disease. Electronically Signed   By: Ashley Royalty M.D.   On: 04/12/2018 23:52    Microbiology: Recent Results (from the past 240 hour(s))  Urine Culture     Status: Abnormal (Preliminary result)   Collection Time: 04/13/18  5:30 AM  Result Value Ref Range Status   Specimen Description URINE, RANDOM  Final   Special Requests   Final    NONE Performed at [facility], [address], [place], [place] [address]    Culture 10,000 COLONIES/mL CITROBACTER FREUNDII (A)  Final   Report Status PENDING  Incomplete     Labs: Basic Metabolic Panel: Recent Labs  Lab 04/12/18 1544 04/12/18 1557 04/13/18 0434 04/13/18 2154 04/14/18 0407  NA 136  --  137 135 140  K 5.3*  --  3.9 3.6 3.6  CL 105  --  106 105 111  CO2 15*  --  19* 23 23  GLUCOSE 122*  --  133* 92 105*  BUN 11  --  8 10 8   CREATININE 0.80  --  0.84 0.77 0.80  CALCIUM 9.4  --  8.5* 8.2* 8.4*  MG  --  2.0  --   --   --    Liver Function Tests: Recent Labs  Lab 04/12/18 1544 04/14/18 0407  AST 42* 20  ALT 18 16  ALKPHOS 59 44  BILITOT 1.5* 1.1  PROT 8.0 6.0*  ALBUMIN 4.3 3.1*   Recent Labs  Lab 04/12/18 1544  LIPASE 20   No results for input(s): AMMONIA in the last 168 hours. CBC: Recent Labs  Lab 04/12/18 1544 04/13/18 0434 04/13/18 2154 04/14/18 0407  WBC 8.1 9.4 7.7 6.3  HGB 15.5* 14.7 13.4 12.8  HCT  48.1* 44.4 42.3 40.1  MCV 96.0 95.1 95.3 94.8  PLT 336 300 293 296   Cardiac Enzymes: No results for input(s): CKTOTAL, CKMB, CKMBINDEX, TROPONINI in the last 168 hours. BNP: BNP (last 3 results) Recent Labs    09/25/17 1843  BNP 106.3*    ProBNP (last 3 results) No results for input(s): PROBNP in the last 8760 hours.  CBG: Recent Labs  Lab 04/12/18 1625 04/13/18 2151  GLUCAP 116* 86       Signed:  Radene Gunning NP [facility] 04/14/2018, 1:20 PM   Patient was seen, examined,treatment plan was discussed with the Advance Practice Provider.  I have personally reviewed the clinical findings, labs, EKG, imaging studies and management of this patient in detail. I have also reviewed the orders written for this patient which were under my direction. I agree with the documentation, as recorded by the Advance Practice Provider.   [name] [name] is a 82 y.o. female here with nausea.  Suspected gastroenteritis from [name] [name] (patient does state it was spicier than she normally gets).  Improved over the 2 days with supportive care.  Episode of unresponsiveness- ? Taking medications from home as she had not gotten anything sedating in > 24 hours.  Tele did not show arrythmia.  ? Sleep study as outpatient and treatment of GERD.      Geradine Girt, DO

## 2018-04-14 NOTE — Accreditation Note (Signed)
Pt was admitted to another floor. Pt going to 5W, and report has been called.

## 2018-04-15 LAB — URINE CULTURE: Culture: 10000 — AB

## 2018-04-16 ENCOUNTER — Telehealth: Payer: Self-pay | Admitting: *Deleted

## 2018-04-16 ENCOUNTER — Encounter: Payer: Self-pay | Admitting: Internal Medicine

## 2018-04-16 NOTE — Telephone Encounter (Signed)
Patient has follow up office visit. Knows to call with any questions or concerns.   

## 2018-04-16 NOTE — Telephone Encounter (Signed)
Called patient on 04/16/2018 , 10:14 AM in an attempt to reach the patient for a hospital follow up.   Admit date: 04/12/18 Discharge: 04/14/18   She does not  have any questions or concerns about medications from the hospital admission. The patient's medications were reviewed over the phone, they were counseled to bring in all current medications to the hospital follow up visit.   I advised the patient to call if any questions or concerns arise about the hospital admission or medications    Home health was not  started in the hospital.  All questions were answered and a follow up appointment was made.   Prior to Admission medications   Medication Sig Start Date End Date Taking? Authorizing Provider  acetaminophen (TYLENOL) 500 MG tablet Take 1 tablet (500 mg total) by mouth every 6 (six) hours as needed. Patient taking differently: Take 500 mg by mouth every 6 (six) hours as needed for mild pain.  03/18/18   Loura Halt A, NP  albuterol (PROAIR HFA) 108 (90 Base) MCG/ACT inhaler Inhale 2 puffs into the lungs every 6 (six) hours as needed for wheezing or shortness of breath. 09/22/16   Vicie Mutters, PA-C  alendronate (FOSAMAX) 70 MG tablet Take with a full glass of water on an empty stomach. Patient taking differently: Take 70 mg by mouth once a week. Take with a full glass of water on an empty stomach. 02/16/18   Deveshwar, Abel Presto, MD  ALPRAZolam (XANAX) 0.5 MG tablet TAKE ONE-HALF TO ONE TABLET BY MOUTH 2 TO 3 TIMES DAILY ONLY IF ANXIETY ATTACK AND PLEASE TRY TO LIMIT TO 5 DAYS A WEEK TO AVOID ADDICTION Patient taking differently: Take 0.25 mg by mouth 2 (two) times daily as needed for anxiety.  TAKE ONE-HALF TO ONE TABLET BY MOUTH 2 TO 3 TIMES DAILY ONLY IF ANXIETY ATTACK AND PLEASE TRY TO LIMIT TO 5 DAYS A WEEK TO AVOID ADDICTION 11/28/17   [name], NP  amLODipine (NORVASC) 10 MG tablet TAKE 1 TABLET BY MOUTH AT BEDTIME Patient taking differently: Take 10 mg by mouth at bedtime.   09/08/17   [name], MD  aspirin EC 81 MG tablet Take 81 mg by mouth daily.    [provider]  Cholecalciferol (VITAMIN D3) 5000 units CAPS Take 5,000 Units by mouth 2 (two) times daily.     [provider]  diclofenac sodium (VOLTAREN) 1 % GEL Apply 2 g topically 4 (four) times daily. Patient taking differently: Apply 2 g topically 4 (four) times daily as needed (pain).  03/18/18   Bast, Tressia Miners A, NP  dicyclomine (BENTYL) 10 MG capsule TAKE 1 CAPSULE BY MOUTH EVERY 8 HOURS AS NEEDED FOR SPASM Patient taking differently: Take 10 mg by mouth 3 (three) times daily as needed for spasms.  02/05/18   Armbruster, Carlota Raspberry, MD  gabapentin (NEURONTIN) 300 MG capsule TAKE 1 CAPSULE BY MOUTH THREE TIMES DAILY FOR  CHRONIC  PAIN. 10/06/17   [name], MD  ipratropium-albuterol (DUONEB) 0.5-2.5 (3) MG/3ML SOLN Take 3 mLs by nebulization every 6 (six) hours as needed (SOB).     [provider]  ketoconazole (NIZORAL) 2 % cream  APPLY ONE APPLICATION TOPICALLY TWO TIMES DAILY Patient taking differently: Apply 1 application topically 2 (two) times daily as needed for irritation.  04/02/18   [name], MD  losartan (COZAAR) 50 MG tablet Take 1 tablet (50 mg total) by mouth daily. 04/15/18   Black, Lezlie Octave, NP  lubiprostone Baker Pierini)  8 MCG capsule Take 1 capsule (8 mcg total) by mouth 2 (two) times daily with a meal. Patient not taking: Reported on 04/12/2018 12/21/17   Zehr, Laban Emperor, PA-C  MAGNESIUM PO Take 1 tablet by mouth daily.     [provider]  meclizine (ANTIVERT) 25 MG tablet 1/2-1 pill up to 3 times daily for motion sickness/dizziness Patient taking differently: Take 12.5-25 mg by mouth 3 (three) times daily as needed for dizziness. 1/2-1 pill up to 3 times daily for motion sickness/dizziness 06/08/16   Vicie Mutters, PA-C  metoprolol tartrate (LOPRESSOR) 50 MG tablet Take 25 mg by mouth at bedtime.     [provider]  mometasone-formoterol  (DULERA) 100-5 MCG/ACT AERO Inhale 2 puffs into the lungs every 12 (twelve) hours. Patient taking differently: Inhale 2 puffs into the lungs every 12 (twelve) hours as needed for wheezing or shortness of breath.  09/08/17   [name], MD  nitroGLYCERIN (NITROSTAT) 0.4 MG SL tablet Place 1 tablet (0.4 mg total) under the tongue every 5 (five) minutes as needed for chest pain. 09/04/14   Larey Dresser, MD  ondansetron (ZOFRAN-ODT) 8 MG disintegrating tablet DISSOLVE ONE TABLET IN MOUTH EVERY 8 HOURS AS NEEDED FOR NAUSEA AND  VOMITING Patient taking differently: Take 8 mg by mouth every 8 (eight) hours as needed for nausea.  10/05/17   [name], NP  OVER THE COUNTER MEDICATION Take 1,000 mg by mouth 2 (two) times daily. Coral Calcium 1000mg     [provider]  oxyCODONE-acetaminophen (PERCOCET/ROXICET) 5-325 MG tablet Take 1 tablet by mouth every 4 (four) hours as needed for severe pain. 03/10/17   Tegeler, Gwenyth Allegra, MD  polyethylene glycol powder (GLYCOLAX/MIRALAX) powder Take 17 g by mouth 2 (two) times daily. Patient taking differently: Take 17 g by mouth daily.  05/16/17   Armbruster, Carlota Raspberry, MD  pravastatin (PRAVACHOL) 40 MG tablet TAKE 1 TABLET BY MOUTH DAILY Patient taking differently: Take 40 mg by mouth daily.  03/14/18   [name], MD  promethazine (PHENERGAN) 25 MG tablet Take 1 tablet (25 mg total) by mouth every 6 (six) hours as needed for nausea or vomiting. 11/11/17   Danford, Suann Larry, MD  senna-docusate (SENOKOT-S) 8.6-50 MG tablet Take 2 tablets by mouth 2 (two) times daily. Patient taking differently: Take 1 tablet by mouth 2 (two) times daily.  12/26/15   Roxan Hockey, MD  tamsulosin (FLOMAX) 0.4 MG CAPS capsule Take 0.4 mg by mouth daily. 03/07/18   [provider]

## 2018-04-16 NOTE — Progress Notes (Signed)
[place]     This very nice 82 y.o. MWF  was admitted to the hospital on Dec 5 , 2019 and patient was discharged from the hospital on Apr 14, 2016. The patient now presents for follow up for transition from recent hospitalization.  The day after discharge  our clinical staff contacted the patient to assure stability and schedule a follow up appointment. The discharge summary, medications and diagnostic test results were reviewed before meeting with the patient. The patient was admitted for:   Intractable nausea and vomiting   Unresponsiveness   Essential hypertension   Hyperkalemia   Mild intermittent asthma without complication   Chronic pain syndrome   Hyperlipidemia, mixed   Anxiety state   UTI    Patient was hospitalized with intractable N/V & diarrhea. She was treated with IVF and various analgesics & antiemetics. The morning following admission, she was discovered unresponsive and code Blue was called and she aroused with Narcan (without CPR). As vital signs stabilized and oral diet & meds were advanced she was felt stable for discharge. Initially elevated BP normalized as her sx's improved. Elevated K+, on admission corrected with IVF rehydration. U/C grew low titer CITROBACTER FREUNDII.     Hospitalization discharge instructions and medications are reconciled with the patient & husband.      Patient is also followed with Hypertension, ASCAD, Hyperlipidemia, Pre-Diabetes and Vitamin D Deficiency.      Patient is treated for HTN (1986) with hx/o CKD3  & in 2003 she had PCA/Stent. Then in 2013 her Stress Myoview was Negative. Today's BP is at goal -  130/72. Patient has had no complaints of any cardiac type chest pain, palpitations, dyspnea/orthopnea/PND, dizziness, claudication, or dependent edema.     Hyperlipidemia is controlled with diet & meds. Patient denies myalgias or other med SE's. Last Lipids were at goal: Lab Results  Component Value Date   CHOL 131  03/02/2018   HDL 65 03/02/2018   LDLCALC 46 03/02/2018   TRIG 116 03/02/2018   CHOLHDL 2.0 03/02/2018      Also, the patient has history of PreDiabetes (2013) and has had no symptoms of reactive hypoglycemia, diabetic polys, paresthesias or visual blurring.  Last A1c was not at goal: Lab Results  Component Value Date   HGBA1C 6.0 (H) 03/02/2018      Further, the patient also has history of Vitamin D Deficiency("25" / 2008)  and supplements vitamin D without any suspected side-effects. Last vitamin D was at goal: Lab Results  Component Value Date   VD25OH 74 03/02/2018   Current Outpatient Medications on File Prior to Visit  Medication Sig  . acetaminophen (TYLENOL) 500 MG tablet Take 1 tablet (500 mg total) by mouth every 6 (six) hours as needed. (Patient taking differently: Take 500 mg by mouth every 6 (six) hours as needed for mild pain. )  . albuterol (PROAIR HFA) 108 (90 Base) MCG/ACT inhaler Inhale 2 puffs into the lungs every 6 (six) hours as needed for wheezing or shortness of breath.  [name] alendronate (FOSAMAX) 70 MG tablet Take with a full glass of water on an empty stomach. (Patient taking differently: Take 70 mg by mouth once a week. Take with a full glass of water on an empty stomach.)  . ALPRAZolam (XANAX) 0.5 MG tablet TAKE ONE-HALF TO ONE TABLET BY MOUTH 2 TO 3 TIMES DAILY ONLY IF ANXIETY ATTACK AND PLEASE TRY TO LIMIT TO 5 DAYS A WEEK TO AVOID ADDICTION (Patient taking differently:  Take 0.25 mg by mouth 2 (two) times daily as needed for anxiety.  TAKE ONE-HALF TO ONE TABLET BY MOUTH 2 TO 3 TIMES DAILY ONLY IF ANXIETY ATTACK AND PLEASE TRY TO LIMIT TO 5 DAYS A WEEK TO AVOID ADDICTION)  . amLODipine (NORVASC) 10 MG tablet TAKE 1 TABLET BY MOUTH AT BEDTIME (Patient taking differently: Take 10 mg by mouth at bedtime. )  . aspirin EC 81 MG tablet Take 81 mg by mouth daily.  . Cholecalciferol (VITAMIN D3) 5000 units CAPS Take 5,000 Units by mouth 2 (two) times daily.   . diclofenac  sodium (VOLTAREN) 1 % GEL Apply 2 g topically 4 (four) times daily. (Patient taking differently: Apply 2 g topically 4 (four) times daily as needed (pain). )  . dicyclomine (BENTYL) 10 MG capsule TAKE 1 CAPSULE BY MOUTH EVERY 8 HOURS AS NEEDED FOR SPASM (Patient taking differently: Take 10 mg by mouth 3 (three) times daily as needed for spasms. )  . gabapentin (NEURONTIN) 300 MG capsule TAKE 1 CAPSULE BY MOUTH THREE TIMES DAILY FOR  CHRONIC  PAIN.  [name] ipratropium-albuterol (DUONEB) 0.5-2.5 (3) MG/3ML SOLN Take 3 mLs by nebulization every 6 (six) hours as needed (SOB).   [name] ketoconazole (NIZORAL) 2 % cream  APPLY ONE APPLICATION TOPICALLY TWO TIMES DAILY (Patient taking differently: Apply 1 application topically 2 (two) times daily as needed for irritation. )  . losartan (COZAAR) 50 MG tablet Take 1 tablet (50 mg total) by mouth daily.  [name] MAGNESIUM PO Take 1 tablet by mouth daily.   . metoprolol tartrate (LOPRESSOR) 50 MG tablet Take 25 mg by mouth at bedtime.   . mometasone-formoterol (DULERA) 100-5 MCG/ACT AERO Inhale 2 puffs into the lungs every 12 (twelve) hours. (Patient taking differently: Inhale 2 puffs into the lungs every 12 (twelve) hours as needed for wheezing or shortness of breath. )  . nitroGLYCERIN (NITROSTAT) 0.4 MG SL tablet Place 1 tablet (0.4 mg total) under the tongue every 5 (five) minutes as needed for chest pain.  [name] ondansetron (ZOFRAN-ODT) 8 MG disintegrating tablet DISSOLVE ONE TABLET IN MOUTH EVERY 8 HOURS AS NEEDED FOR NAUSEA AND  VOMITING (Patient taking differently: Take 8 mg by mouth every 8 (eight) hours as needed for nausea. )  . OVER THE COUNTER MEDICATION Take 1,000 mg by mouth 2 (two) times daily. Coral Calcium 1000mg   . oxyCODONE-acetaminophen (PERCOCET/ROXICET) 5-325 MG tablet Take 1 tablet by mouth every 4 (four) hours as needed for severe pain.  . polyethylene glycol powder (GLYCOLAX/MIRALAX) powder Take 17 g by mouth 2 (two) times daily. (Patient taking differently:  Take 17 g by mouth daily. )  . pravastatin (PRAVACHOL) 40 MG tablet TAKE 1 TABLET BY MOUTH DAILY (Patient taking differently: Take 40 mg by mouth daily. )  . promethazine (PHENERGAN) 25 MG tablet Take 1 tablet (25 mg total) by mouth every 6 (six) hours as needed for nausea or vomiting.  . senna-docusate (SENOKOT-S) 8.6-50 MG tablet Take 2 tablets by mouth 2 (two) times daily. (Patient taking differently: Take 1 tablet by mouth 2 (two) times daily. )  . tamsulosin (FLOMAX) 0.4 MG CAPS capsule Take 0.4 mg by mouth daily.  [name] lubiprostone (AMITIZA) 8 MCG capsule Take 1 capsule (8 mcg total) by mouth 2 (two) times daily with a meal. (Patient not taking: Reported on 04/12/2018)   Current Facility-Administered Medications on File Prior to Visit  Medication  . 0.9 %  sodium chloride infusion   Allergies  Allergen Reactions  .  Ace Inhibitors Cough    Per Dr Melvyn Novas pulmonology 2013  . Aspartame Diarrhea and Nausea And Vomiting  . Atorvastatin Other (See Comments)    Muscle aches  . Biaxin [Clarithromycin] Other (See Comments)    PATIENT CAN TOLERATE Z-PAK.  Is written on patient's paper chart.  [name] [Roflumilast] Nausea And Vomiting  . Erythromycin Other (See Comments)    PATIENT CAN TOLERATE Z-PAK.  It is written on patient's paper chart.  . Levofloxacin Nausea And Vomiting   PMHx:   Past Medical History:  Diagnosis Date  . Anxiety    takes Xanax daily as needed  . Asthma    Albuterol daily as needed  . Blood transfusion without reported diagnosis   . CAD (coronary artery disease)    LHC 2003 with 40% pLAD, 30% mLAD, 100% D1 (moderate vessel), 100%  D2 (small vessel), 95% mid small OM2.  Pt had PTCA to D1;  D2 and OM2 small vessels and tx medically;  Last Myoview (2012): anterior infarct seen with scar, no ischemia. EF normal. Med Rx // Myoview (12/14):  Low risk; ant scar w/ peri-infarct ischemia; EF 55% with ant AK // Myoview 5/16: EF 55-65, ant, apical scar, no ischemia   . Cataract     . Chronic back pain    HNP, DDD, spinal stenosis, vertebral compression fractures.   . Chronic nausea    takes Zofran daily as needed.  normal gastric emptying study 03/2013  . CKD (chronic kidney disease), stage III ([place]) 09/27/2017  . Constipation    felt to be functional. takes Senokot and Miralax daily.  treated with Linzess in past.   . DIVERTICULOSIS, COLON 08/28/2008   Qualifier: Diagnosis of  By: [name], [name], [name]    . Elevated LFTs 2015   probably med induced DILI, from Augmentin vs Pravastatin  . GERD (gastroesophageal reflux disease)    hx of esophageal stricture   . Hiatal hernia    Paraesophageal hernias. s/p fundoplications in 7616 and 04/2013  . History of bronchitis several of yrs ago  . History of colon polyps 03/2009, 03/2011   adenomatous colon polyps, no high grade dysplasia.   [name] History of vertigo    no meds  . Hyperlipidemia    takes Pravastatin daily  . Hypertension    takes Amlodipine,Metoprolol,and Losartan daily  . Nausea 03/2016  . Osteoporosis   . PONV (postoperative nausea and vomiting)    yrs ago  . Slow urinary stream    takes Rapaflo daily  . TIA (transient ischemic attack) 1995   no residual effects noted   Immunization History  Administered Date(s) Administered  . DT 05/06/2014  . Influenza Split 02/07/2011  . Influenza Whole 02/09/2009, 01/19/2010, 02/13/2012  . Influenza, High Dose Seasonal PF 03/22/2013, 01/15/2014, 02/27/2015, 01/14/2016, 01/18/2017  . Pneumococcal Conjugate-13 05/06/2014  . Pneumococcal Polysaccharide-23 02/09/2011  . Pneumococcal-Unspecified 02/06/2010, 02/09/2012  . Td 05/10/2003   Past Surgical History:  Procedure Laterality Date  . APPENDECTOMY     patient unsure of date  . BACK SURGERY    . BLADDER REPAIR    . CHOLECYSTECTOMY     Patient unsure of date.  . CORONARY ANGIOPLASTY  11/16/2001   1 stent  . ESOPHAGEAL MANOMETRY N/A 03/25/2013   Procedure: ESOPHAGEAL MANOMETRY (EM);  Surgeon: Inda Castle, MD;  Location: [facility] ENDOSCOPY;  Service: Endoscopy;  Laterality: N/A;  . ESOPHAGOGASTRODUODENOSCOPY N/A 03/15/2013   Procedure: ESOPHAGOGASTRODUODENOSCOPY (EGD);  Surgeon: Jerene Bears, MD;  Location: [facility]  ENDOSCOPY;  Service: Endoscopy;  Laterality: N/A;  . ESOPHAGOGASTRODUODENOSCOPY N/A 12/25/2015   Procedure: ESOPHAGOGASTRODUODENOSCOPY (EGD);  Surgeon: Doran Stabler, MD;  Location: [facility] ENDOSCOPY;  Service: Endoscopy;  Laterality: N/A;  . EYE SURGERY  11/2000   bilateral cataracts with lens implant  . FIXATION KYPHOPLASTY LUMBAR SPINE  X 2   "L3-4"  . HEMORRHOID SURGERY    . HIATAL HERNIA REPAIR  06/03/2011   Procedure: LAPAROSCOPIC REPAIR OF HIATAL HERNIA;  Surgeon: Adin Hector, MD;  Location: [facility];  Service: General;  Laterality: N/A;  . HIATAL HERNIA REPAIR N/A 04/24/2013   Procedure: LAPAROSCOPIC  REPAIR RECURRENT PARASOPHAGEAL HIATAL HERNIA WITH FUNDOPLICATION;  Surgeon: Adin Hector, MD;  Location: [facility];  Service: General;  Laterality: N/A;  . INSERTION OF MESH N/A 04/24/2013   Procedure: INSERTION OF MESH;  Surgeon: Adin Hector, MD;  Location: [facility];  Service: General;  Laterality: N/A;  . IR KYPHO THORACIC WITH BONE BIOPSY  09/29/2017  . IR KYPHO THORACIC WITH BONE BIOPSY  11/03/2017  . LAPAROSCOPIC LYSIS OF ADHESIONS N/A 04/24/2013   Procedure: LAPAROSCOPIC LYSIS OF ADHESIONS;  Surgeon: Adin Hector, MD;  Location: [facility];  Service: General;  Laterality: N/A;  . LAPAROSCOPIC NISSEN FUNDOPLICATION  3/71/0626   Procedure: LAPAROSCOPIC NISSEN FUNDOPLICATION;  Surgeon: Adin Hector, MD;  Location: [facility];  Service: General;  Laterality: N/A;  . LUMBAR FUSION  12/07/2015   Right L2-3 facetectomy, posterolateral fusion L2-3 with pedicle screws, rods, local bone graft, Vivigen cancellous chips. Fusion extended to the L1 level with pedicle screws and rods  . LUMBAR LAMINECTOMY/DECOMPRESSION MICRODISCECTOMY Right 06/26/2012   Procedure: LUMBAR  LAMINECTOMY/DECOMPRESSION MICRODISCECTOMY;  Surgeon: Jessy Oto, MD;  Location: [facility];  Service: Orthopedics;  Laterality: Right;  RIGHT L4-5 MICRODISCECTOMY  . LUMBAR LAMINECTOMY/DECOMPRESSION MICRODISCECTOMY N/A 05/29/2015   Procedure: RIGHT L2-3 MICRODISCECTOMY;  Surgeon: Jessy Oto, MD;  Location: [facility];  Service: Orthopedics;  Laterality: N/A;  . TOTAL ABDOMINAL HYSTERECTOMY  1975   partial  . UPPER GASTROINTESTINAL ENDOSCOPY     FHx:    Reviewed / unchanged  SHx:    Reviewed / unchanged  Systems Review:  Constitutional: Denies fever, chills, wt changes, headaches, insomnia, fatigue, night sweats, change in appetite. Eyes: Denies redness, blurred vision, diplopia, discharge, itchy, watery eyes.  ENT: Denies discharge, congestion, post nasal drip, epistaxis, sore throat, earache, hearing loss, dental pain, tinnitus, vertigo, sinus pain, snoring.  CV: Denies chest pain, palpitations, irregular heartbeat, syncope, dyspnea, diaphoresis, orthopnea, PND, claudication or edema. Respiratory: denies cough, dyspnea, DOE, pleurisy, hoarseness, laryngitis, wheezing.  Gastrointestinal: Denies dysphagia, odynophagia, heartburn, reflux, water brash, abdominal pain or cramps, nausea, vomiting, bloating, diarrhea, constipation, hematemesis, melena, hematochezia  or hemorrhoids. Genitourinary: Denies dysuria, frequency, urgency, nocturia, hesitancy, discharge, hematuria or flank pain. Musculoskeletal: Denies arthralgias, myalgias, stiffness, jt. swelling, pain, limping or strain/sprain.  Skin: Denies pruritus, rash, hives, warts, acne, eczema or change in skin lesion(s). Neuro: No weakness, tremor, incoordination, spasms, paresthesia or pain. Psychiatric: Denies confusion, memory loss or sensory loss. Endo: Denies change in weight, skin or hair change.  Heme/Lymph: No excessive bleeding, bruising or enlarged lymph nodes.  Physical Exam  BP 130/72   Pulse 68   Temp (!) 97.3 F (36.3 C)   Resp  16   Ht 5\' 3"  (1.6 m)   Wt 157 lb 12.8 oz (71.6 kg)   BMI 27.95 kg/m   Appears well nourished, well groomed  and in no distress.  Eyes: PERRLA, EOMs, conjunctiva no swelling or erythema. Sinuses: No frontal/maxillary tenderness ENT/Mouth: EAC's clear, TM's nl w/o erythema, bulging. Nares clear w/o erythema, swelling, exudates. Oropharynx clear without erythema or exudates. Oral hygiene is good. Tongue normal, non obstructing. Hearing intact.  Neck: Supple. Thyroid nl. Car 2+/2+ without bruits, nodes or JVD. Chest: Respirations nl with BS clear & equal w/o rales, rhonchi, wheezing or stridor.  Cor: Heart sounds normal w/ regular rate and rhythm without sig. murmurs, gallops, clicks or rubs. Peripheral pulses normal and equal  without edema.  Abdomen: Soft & bowel sounds normal. Non-tender w/o guarding, rebound, hernias, masses or organomegaly.  Lymphatics: Unremarkable.  Musculoskeletal: Full ROM all peripheral extremities, joint stability, 5/5 strength and normal gait.  Skin: Warm, dry without exposed rashes, lesions or ecchymosis apparent.  Neuro: Cranial nerves intact, reflexes equal bilaterally. Sensory-motor testing grossly intact. Tendon reflexes grossly intact.  Pysch: Alert & oriented x 3.  Insight and judgement nl & appropriate. No ideations.  Assessment and Plan:  1. Intractable vomiting with nausea, unspecified vomiting type  - CBC with Differential/Platelet - COMPLETE METABOLIC PANEL WITH GFR - Magnesium  2. Unresponsiveness  3. Essential hypertension  - Continue medication, monitor blood pressure at home.  - Continue DASH diet. Reminder to go to the ER if any CP,  SOB, nausea, dizziness, severe HA, changes vision/speech.  - CBC with Differential/Platelet - COMPLETE METABOLIC PANEL WITH GFR  4. Hyperkalemia  - COMPLETE METABOLIC PANEL WITH GFR  5. Mild intermittent asthma without complication  6. Chronic pain syndrome  7. Gastroesophageal reflux disease,  esophagitis  - CBC with Differential/Platelet - omeprazole (PRILOSEC) 20 MG capsule; Take 1 capsule daily for heartburn, Reflux & Nausea  Dispense: 90 capsule; Refill: 3 - sucralfate (CARAFATE) 1 g tablet; Take 1 tablet 2 to 4 x /day before meals for Heartburn  Dispense: 360 tablet; Refill: 1  8. Urinary tract infection without hematuria  - Urinalysis, Routine w reflex microscopic - Urine Culture  9. CKD (chronic kidney disease) stage 3, GFR 30-59 ml/min (HCC)  - COMPLETE METABOLIC PANEL WITH GFR  10. Coronary artery disease due to lipid rich plaque  11. Hyperlipidemia, mixed  12. Anxiety state  13. Medication management  - CBC with Differential/Platelet - COMPLETE METABOLIC PANEL WITH GFR - Magnesium - Urinalysis, Routine w reflex microscopic - Urine Culture  - omeprazole (PRILOSEC) 20 MG capsule; Take 1 capsule daily for heartburn, Reflux & Nausea  Dispense: 90 capsule; Refill: 3 - sucralfate (CARAFATE) 1 g tablet; Take 1 tablet 2 to 4 x /day before meals for Heartburn  Dispense: 360 tablet; Refill: 1      Discussed  regular exercise, BP monitoring, weight control to achieve/maintain BMI less than 25 and discussed meds and SE's. Recommended labs to assess and monitor clinical status with further disposition pending results of labs. Over 30 minutes of exam, counseling, chart review was performed.

## 2018-04-17 ENCOUNTER — Ambulatory Visit (INDEPENDENT_AMBULATORY_CARE_PROVIDER_SITE_OTHER): Payer: PPO | Admitting: Internal Medicine

## 2018-04-17 VITALS — BP 130/72 | HR 68 | Temp 97.3°F | Resp 16 | Ht 63.0 in | Wt 157.8 lb

## 2018-04-17 DIAGNOSIS — J4520 Mild intermittent asthma, uncomplicated: Secondary | ICD-10-CM

## 2018-04-17 DIAGNOSIS — E782 Mixed hyperlipidemia: Secondary | ICD-10-CM

## 2018-04-17 DIAGNOSIS — R4189 Other symptoms and signs involving cognitive functions and awareness: Secondary | ICD-10-CM

## 2018-04-17 DIAGNOSIS — E875 Hyperkalemia: Secondary | ICD-10-CM | POA: Diagnosis not present

## 2018-04-17 DIAGNOSIS — F411 Generalized anxiety disorder: Secondary | ICD-10-CM

## 2018-04-17 DIAGNOSIS — R112 Nausea with vomiting, unspecified: Secondary | ICD-10-CM | POA: Diagnosis not present

## 2018-04-17 DIAGNOSIS — K219 Gastro-esophageal reflux disease without esophagitis: Secondary | ICD-10-CM | POA: Diagnosis not present

## 2018-04-17 DIAGNOSIS — N183 Chronic kidney disease, stage 3 unspecified: Secondary | ICD-10-CM

## 2018-04-17 DIAGNOSIS — I2510 Atherosclerotic heart disease of native coronary artery without angina pectoris: Secondary | ICD-10-CM | POA: Diagnosis not present

## 2018-04-17 DIAGNOSIS — N390 Urinary tract infection, site not specified: Secondary | ICD-10-CM | POA: Diagnosis not present

## 2018-04-17 DIAGNOSIS — I2583 Coronary atherosclerosis due to lipid rich plaque: Secondary | ICD-10-CM

## 2018-04-17 DIAGNOSIS — G894 Chronic pain syndrome: Secondary | ICD-10-CM

## 2018-04-17 DIAGNOSIS — I10 Essential (primary) hypertension: Secondary | ICD-10-CM | POA: Diagnosis not present

## 2018-04-17 DIAGNOSIS — Z79899 Other long term (current) drug therapy: Secondary | ICD-10-CM

## 2018-04-17 MED ORDER — OMEPRAZOLE 20 MG PO CPDR: DELAYED_RELEASE_CAPSULE | ORAL | 3 refills | Status: DC

## 2018-04-17 MED ORDER — SUCRALFATE 1 G PO TABS: ORAL_TABLET | ORAL | 1 refills | Status: DC

## 2018-04-17 NOTE — Patient Instructions (Signed)
Food Choices for Gastroesophageal Reflux Disease, Adult When you have gastroesophageal reflux disease (GERD), the foods you eat and your eating habits are very important. Choosing the right foods can help ease your discomfort. What guidelines do I need to follow?  Choose fruits, vegetables, whole grains, and low-fat dairy products.  Choose low-fat meat, fish, and poultry.  Limit fats such as oils, salad dressings, butter, nuts, and avocado.  Keep a food diary. This helps you identify foods that cause symptoms.  Avoid foods that cause symptoms. These may be different for everyone.  Eat small meals often instead of 3 large meals a day.  Eat your meals slowly, in a place where you are relaxed.  Limit fried foods.  Cook foods using methods other than frying.  Avoid drinking alcohol.  Avoid drinking large amounts of liquids with your meals.  Avoid bending over or lying down until 2-3 hours after eating. What foods are not recommended? These are some foods and drinks that may make your symptoms worse: Vegetables  Tomatoes. Tomato juice. Tomato and spaghetti sauce. Chili peppers. Onion and garlic. Horseradish. Fruits  Oranges, grapefruit, and lemon (fruit and juice). Meats  High-fat meats, fish, and poultry. This includes hot dogs, ribs, ham, sausage, salami, and bacon. Dairy  Whole milk and chocolate milk. Sour cream. Cream. Butter. Ice cream. Cream cheese. Drinks  Coffee and tea. Bubbly (carbonated) drinks or energy drinks. Condiments  Hot sauce. Barbecue sauce. Sweets/Desserts  Chocolate and cocoa. Donuts. Peppermint and spearmint. Fats and Oils  High-fat foods. This includes French fries and potato chips. Other  Vinegar. Strong spices. This includes black pepper, white pepper, red pepper, cayenne, curry powder, cloves, ginger, and chili powder. The items listed above may not be a complete list of foods and drinks to avoid. Contact your dietitian for more information.    This information is not intended to replace advice given to you by your health care provider. Make sure you discuss any questions you have with your health care provider. Document Released: 10/25/2011 Document Revised: 10/01/2015 Document Reviewed: 02/27/2013 Elsevier Interactive Patient Education  2017 Elsevier Inc.  

## 2018-04-18 LAB — COMPLETE METABOLIC PANEL WITHOUT GFR
AG Ratio: 1.5 (calc) (ref 1.0–2.5)
ALT: 18 U/L (ref 6–29)
AST: 21 U/L (ref 10–35)
Albumin: 4.3 g/dL (ref 3.6–5.1)
Alkaline phosphatase (APISO): 65 U/L (ref 33–130)
BUN: 18 mg/dL (ref 7–25)
CO2: 25 mmol/L (ref 20–32)
Calcium: 10.2 mg/dL (ref 8.6–10.4)
Chloride: 101 mmol/L (ref 98–110)
Creat: 0.79 mg/dL (ref 0.60–0.88)
GFR, Est African American: 82 mL/min/{1.73_m2}
GFR, Est Non African American: 71 mL/min/{1.73_m2}
Globulin: 2.8 g/dL (ref 1.9–3.7)
Glucose, Bld: 83 mg/dL (ref 65–99)
Potassium: 4.6 mmol/L (ref 3.5–5.3)
Sodium: 139 mmol/L (ref 135–146)
Total Bilirubin: 0.7 mg/dL (ref 0.2–1.2)
Total Protein: 7.1 g/dL (ref 6.1–8.1)

## 2018-04-18 LAB — CBC WITH DIFFERENTIAL/PLATELET
Basophils Absolute: 71 cells/uL (ref 0–200)
Basophils Relative: 0.8 %
Eosinophils Absolute: 436 cells/uL (ref 15–500)
Eosinophils Relative: 4.9 %
HCT: 42.9 % (ref 35.0–45.0)
Hemoglobin: 14.3 g/dL (ref 11.7–15.5)
Lymphs Abs: 2092 cells/uL (ref 850–3900)
MCH: 31.2 pg (ref 27.0–33.0)
MCHC: 33.3 g/dL (ref 32.0–36.0)
MCV: 93.7 fL (ref 80.0–100.0)
MPV: 11 fL (ref 7.5–12.5)
Monocytes Relative: 8.5 %
NEUTROS PCT: 62.3 %
Neutro Abs: 5545 cells/uL (ref 1500–7800)
Platelets: 316 10*3/uL (ref 140–400)
RBC: 4.58 10*6/uL (ref 3.80–5.10)
RDW: 13.2 % (ref 11.0–15.0)
Total Lymphocyte: 23.5 %
WBC mixed population: 757 cells/uL (ref 200–950)
WBC: 8.9 10*3/uL (ref 3.8–10.8)

## 2018-04-18 LAB — URINALYSIS, ROUTINE W REFLEX MICROSCOPIC
Bilirubin Urine: NEGATIVE
Glucose, UA: NEGATIVE
Hgb urine dipstick: NEGATIVE
KETONES UR: NEGATIVE
Leukocytes, UA: NEGATIVE
NITRITE: NEGATIVE
Protein, ur: NEGATIVE
Specific Gravity, Urine: 1.006 (ref 1.001–1.03)
pH: 7 (ref 5.0–8.0)

## 2018-04-18 LAB — URINE CULTURE
MICRO NUMBER:: 91480473
Result:: NO GROWTH
SPECIMEN QUALITY:: ADEQUATE

## 2018-04-18 LAB — MAGNESIUM: Magnesium: 1.8 mg/dL (ref 1.5–2.5)

## 2018-04-20 ENCOUNTER — Other Ambulatory Visit: Payer: Self-pay | Admitting: Physician Assistant

## 2018-04-20 DIAGNOSIS — M4726 Other spondylosis with radiculopathy, lumbar region: Secondary | ICD-10-CM | POA: Diagnosis not present

## 2018-04-20 DIAGNOSIS — G894 Chronic pain syndrome: Secondary | ICD-10-CM | POA: Diagnosis not present

## 2018-04-20 DIAGNOSIS — R110 Nausea: Secondary | ICD-10-CM | POA: Diagnosis not present

## 2018-04-20 DIAGNOSIS — H8110 Benign paroxysmal vertigo, unspecified ear: Secondary | ICD-10-CM

## 2018-04-20 DIAGNOSIS — Z79891 Long term (current) use of opiate analgesic: Secondary | ICD-10-CM | POA: Diagnosis not present

## 2018-04-22 ENCOUNTER — Encounter: Payer: Self-pay | Admitting: Internal Medicine

## 2018-04-25 ENCOUNTER — Encounter (INDEPENDENT_AMBULATORY_CARE_PROVIDER_SITE_OTHER): Payer: Self-pay | Admitting: Specialist

## 2018-04-25 ENCOUNTER — Ambulatory Visit (INDEPENDENT_AMBULATORY_CARE_PROVIDER_SITE_OTHER): Payer: PPO | Admitting: Specialist

## 2018-04-25 VITALS — BP 128/77 | HR 69 | Ht 63.0 in | Wt 163.0 lb

## 2018-04-25 DIAGNOSIS — M48062 Spinal stenosis, lumbar region with neurogenic claudication: Secondary | ICD-10-CM

## 2018-04-25 DIAGNOSIS — M4726 Other spondylosis with radiculopathy, lumbar region: Secondary | ICD-10-CM | POA: Diagnosis not present

## 2018-04-25 DIAGNOSIS — M4316 Spondylolisthesis, lumbar region: Secondary | ICD-10-CM | POA: Diagnosis not present

## 2018-04-25 DIAGNOSIS — M810 Age-related osteoporosis without current pathological fracture: Secondary | ICD-10-CM

## 2018-04-25 NOTE — Patient Instructions (Signed)
Avoid bending, stooping and avoid lifting weights greater than 10 lbs. Avoid prolong standing and walking. Avoid frequent bending and stooping  No lifting greater than 10 lbs. May use ice or moist heat for pain. Weight loss is of benefit. Handicap license is approved. Dr. Phillip's secretary/Assistant will call to arrange for epidural steroid injection  

## 2018-04-25 NOTE — Progress Notes (Signed)
Office Visit Note   Patient: [name] [name]           Date of Birth: 12-May-1935           MRN: [id number] Visit Date: 04/25/2018              Requested by: [name], [address], [place] [address] PCP: [name], MD   Assessment & Plan: Visit Diagnoses:  1. Spondylolisthesis, lumbar region   2. Spinal stenosis of lumbar region with neurogenic claudication   3. Age-related osteoporosis without current pathological fracture   4. Other spondylosis with radiculopathy, lumbar region     Plan: Avoid bending, stooping and avoid lifting weights greater than 10 lbs. Avoid prolong standing and walking. Avoid frequent bending and stooping  No lifting greater than 10 lbs. May use ice or moist heat for pain. Weight loss is of benefit. Handicap license is approved. Dr. Lewie Loron secretary/Assistant will call to arrange for epidural steroid injection   Follow-Up Instructions: No follow-ups on file.   Orders:  Orders Placed This Encounter  Procedures  . Ambulatory referral to Pain Clinic   No orders of the defined types were placed in this encounter.     Procedures: No procedures performed   Clinical Data: Findings:  Lumbar radiographs from 11/2017 with fusion L1to L3, grade one anterolisthesis L4-5.     Subjective: Chief Complaint  Patient presents with  . Lower Back - Follow-up    82 year old female with history of lumbar osteoporosis with multiple lumbar compression fractures, she underwent right sided decompressions of the right L3 and L2 neuroforamen for stenosis with fusion extended to L1. She has pain with standing and walking with right leg pain. Is able to bend fine and can reach her shoes and socks without difficulty. In the past had a disc herniation at L4-5 and this is the site of a grade 1 spondylolisthesis. Pain is worse into the right anterior thigh and knee and stops at the right knee and right below it.     Review  of Systems   Objective: Vital Signs: BP 128/77 (BP Location: Left Arm, Patient Position: Sitting)   Pulse 69   Ht 5\' 3"  (1.6 m)   Wt 163 lb (73.9 kg)   BMI 28.87 kg/m   Physical Exam Constitutional:      Appearance: She is well-developed.  HENT:     Head: Normocephalic and atraumatic.  Eyes:     Pupils: Pupils are equal, round, and reactive to light.  Neck:     Musculoskeletal: Normal range of motion and neck supple.  Pulmonary:     Effort: Pulmonary effort is normal.     Breath sounds: Normal breath sounds.  Abdominal:     General: Bowel sounds are normal.     Palpations: Abdomen is soft.  Skin:    General: Skin is warm and dry.  Neurological:     Mental Status: She is alert and oriented to person, place, and time.  Psychiatric:        Behavior: Behavior normal.        Thought Content: Thought content normal.        Judgment: Judgment normal.     Back Exam   Tenderness  The patient is experiencing tenderness in the lumbar.  Range of Motion  Extension: abnormal  Flexion: normal  Lateral bend right: abnormal  Lateral bend left: abnormal  Rotation right: normal  Rotation left: normal  Muscle Strength  The patient has normal back strength.  Tests  Straight leg raise right: negative Straight leg raise left: negative  Reflexes  Patellar: normal Achilles: normal Babinski's sign: normal   Other  Toe walk: normal Heel walk: abnormal Sensation: normal Gait: normal  Erythema: no back redness Scars: present  Comments:  Right quad with 1/4 inch atrophy and right calf 1/2 atrophy.       Specialty Comments:  No specialty comments available.  Imaging: No results found.   PMFS History: Patient Active Problem List   Diagnosis Date Noted  . Lumbar radiculopathy, acute 12/07/2015    Priority: High    Class: Chronic  . Herniated lumbar intervertebral disc 06/25/2012    Priority: High    Class: Acute  . Unresponsiveness 04/14/2018  .  Hyperkalemia 04/13/2018  . Intractable nausea and vomiting 04/12/2018  . Abnormal glucose 03/01/2018  . FHx: heart disease 03/01/2018  . Therapeutic opioid-induced constipation (OIC) 12/21/2017  . Abnormal UGI series 12/21/2017  . CKD (chronic kidney disease) stage 3, GFR 30-59 ml/min (HCC) 09/27/2017  . T12 compression fracture ([place]) 09/26/2017  . Chronic bronchitis ([place]) 08/09/2017  . Atherosclerosis of aorta ([place]) 04/05/2017  . Costochondritis 11/14/2016  . Anxiety state 06/08/2016  . Spondylolisthesis, lumbar region 04/13/2016  . Dysphagia   . Spinal stenosis, lumbar region, with neurogenic claudication 05/29/2015  . DDD (degenerative disc disease), lumbar 04/21/2015  . Hyperlipidemia, mixed 11/24/2014  . Gastroesophageal reflux disease 11/24/2014  . Depression, major, in partial remission ([place]) 09/26/2014  . Chronic pain syndrome 09/26/2014  . Coronary artery disease due to lipid rich plaque   . Osteoporosis 02/10/2014  . Prediabetes 08/14/2013  . Vitamin D deficiency 08/14/2013  . Medication management 08/14/2013  . Constipation, chronic 04/03/2013  . MGUS (monoclonal gammopathy of unknown significance) 03/05/2013  . Vertebral compression fracture ([place]) 06/23/2012  . ESOPHAGEAL STRICTURE 08/28/2008  . Mild intermittent asthma without complication 24/40/1027  . Incarcerated recurrent paraesophageal hiatal hernia s/p lap redo repair [id number] 01/31/2008  . Essential hypertension 07/25/2007   Past Medical History:  Diagnosis Date  . Anxiety    takes Xanax daily as needed  . Asthma    Albuterol daily as needed  . Blood transfusion without reported diagnosis   . CAD (coronary artery disease)    LHC 2003 with 40% pLAD, 30% mLAD, 100% D1 (moderate vessel), 100%  D2 (small vessel), 95% mid small OM2.  Pt had PTCA to D1;  D2 and OM2 small vessels and tx medically;  Last Myoview (2012): anterior infarct seen with scar, no ischemia. EF normal. Med Rx // Myoview (12/14):  Low risk;  ant scar w/ peri-infarct ischemia; EF 55% with ant AK // Myoview 5/16: EF 55-65, ant, apical scar, no ischemia   . Cataract   . Chronic back pain    HNP, DDD, spinal stenosis, vertebral compression fractures.   . Chronic nausea    takes Zofran daily as needed.  normal gastric emptying study 03/2013  . CKD (chronic kidney disease), stage III ([place]) 09/27/2017  . Constipation    felt to be functional. takes Senokot and Miralax daily.  treated with Linzess in past.   . DIVERTICULOSIS, COLON 08/28/2008   Qualifier: Diagnosis of  By: [name], [name], [name]    . Elevated LFTs 2015   probably med induced DILI, from Augmentin vs Pravastatin  . GERD (gastroesophageal reflux disease)    hx of esophageal stricture   . Hiatal hernia    Paraesophageal hernias. s/p fundoplications  in 2013 and 04/2013  . History of bronchitis several of yrs ago  . History of colon polyps 03/2009, 03/2011   adenomatous colon polyps, no high grade dysplasia.   [name] History of vertigo    no meds  . Hyperlipidemia    takes Pravastatin daily  . Hypertension    takes Amlodipine,Metoprolol,and Losartan daily  . Nausea 03/2016  . Osteoporosis   . PONV (postoperative nausea and vomiting)    yrs ago  . Slow urinary stream    takes Rapaflo daily  . TIA (transient ischemic attack) 1995   no residual effects noted    Family History  Problem Relation Age of Onset  . Colon cancer Mother 98  . Heart attack Father 10       heart attack  . Brain cancer Brother        tumor   . Heart disease Brother   . Heart disease Brother   . Kidney disease Daughter   . Esophageal cancer Neg Hx   . Stomach cancer Neg Hx     Past Surgical History:  Procedure Laterality Date  . APPENDECTOMY     patient unsure of date  . BACK SURGERY    . BLADDER REPAIR    . CHOLECYSTECTOMY     Patient unsure of date.  . CORONARY ANGIOPLASTY  11/16/2001   1 stent  . ESOPHAGEAL MANOMETRY N/A 03/25/2013   Procedure: ESOPHAGEAL MANOMETRY (EM);   Surgeon: Inda Castle, MD;  Location: [facility];  Service: Endoscopy;  Laterality: N/A;  . ESOPHAGOGASTRODUODENOSCOPY N/A 03/15/2013   Procedure: ESOPHAGOGASTRODUODENOSCOPY (EGD);  Surgeon: Jerene Bears, MD;  Location[facility] ENDOSCOPY;  Service: Endoscopy;  Laterality: N/A;  . ESOPHAGOGASTRODUODENOSCOPY N/A 12/25/2015   Procedure: ESOPHAGOGASTRODUODENOSCOPY (EGD);  Surgeon: Doran Stabler, MD;  Location[facility] ENDOSCOPY;  Service: Endoscopy;  Laterality: N/A;  . EYE SURGERY  11/2000   bilateral cataracts with lens implant  . FIXATION KYPHOPLASTY LUMBAR SPINE  X 2   "L3-4"  . HEMORRHOID SURGERY    . HIATAL HERNIA REPAIR  06/03/2011   Procedure: LAPAROSCOPIC REPAIR OF HIATAL HERNIA;  Surgeon: Adin Hector, MD;  Location: [facility];  Service: General;  Laterality: N/A;  . HIATAL HERNIA REPAIR N/A 04/24/2013   Procedure: LAPAROSCOPIC  REPAIR RECURRENT PARASOPHAGEAL HIATAL HERNIA WITH FUNDOPLICATION;  Surgeon: Adin Hector, MD;  Location: [facility];  Service: General;  Laterality: N/A;  . INSERTION OF MESH N/A 04/24/2013   Procedure: INSERTION OF MESH;  Surgeon: Adin Hector, MD;  Location: [facility];  Service: General;  Laterality: N/A;  . IR KYPHO THORACIC WITH BONE BIOPSY  09/29/2017  . IR KYPHO THORACIC WITH BONE BIOPSY  11/03/2017  . LAPAROSCOPIC LYSIS OF ADHESIONS N/A 04/24/2013   Procedure: LAPAROSCOPIC LYSIS OF ADHESIONS;  Surgeon: Adin Hector, MD;  Location: [facility];  Service: General;  Laterality: N/A;  . LAPAROSCOPIC NISSEN FUNDOPLICATION  1/61/0960   Procedure: LAPAROSCOPIC NISSEN FUNDOPLICATION;  Surgeon: Adin Hector, MD;  Location: [facility];  Service: General;  Laterality: N/A;  . LUMBAR FUSION  12/07/2015   Right L2-3 facetectomy, posterolateral fusion L2-3 with pedicle screws, rods, local bone graft, Vivigen cancellous chips. Fusion extended to the L1 level with pedicle screws and rods  . LUMBAR LAMINECTOMY/DECOMPRESSION MICRODISCECTOMY Right 06/26/2012   Procedure: LUMBAR  LAMINECTOMY/DECOMPRESSION MICRODISCECTOMY;  Surgeon: Jessy Oto, MD;  Location: [facility];  Service: Orthopedics;  Laterality: Right;  RIGHT L4-5 MICRODISCECTOMY  . LUMBAR LAMINECTOMY/DECOMPRESSION MICRODISCECTOMY N/A 05/29/2015  Procedure: RIGHT L2-3 MICRODISCECTOMY;  Surgeon: Jessy Oto, MD;  Location: [facility];  Service: Orthopedics;  Laterality: N/A;  . TOTAL ABDOMINAL HYSTERECTOMY  1975   partial  . UPPER GASTROINTESTINAL ENDOSCOPY     Social History   Occupational History  . Occupation: Retired    Fish farm manager: RETIRED    Comment: worked at [employer]  . Smoking status: Never Smoker  . Smokeless tobacco: Never Used  Substance and Sexual Activity  . Alcohol use: No  . Drug use: Never  . Sexual activity: Not on file

## 2018-05-03 NOTE — Progress Notes (Deleted)
Office Visit Note  Patient: [name] [name]             Date of Birth: 03-Oct-1935           MRN: [id number]             PCP: [name], MD Referring: [name], MD Visit Date: 05/17/2018 Occupation: @GUAROCC @  Subjective:  No chief complaint on file.   History of Present Illness: [name] [name] is a 82 y.o. female ***   Activities of Daily Living:  Patient reports morning stiffness for *** {minute/hour:19697}.   Patient {ACTIONS;DENIES/REPORTS:21021675::"Denies"} nocturnal pain.  Difficulty dressing/grooming: {ACTIONS;DENIES/REPORTS:21021675::"Denies"} Difficulty climbing stairs: {ACTIONS;DENIES/REPORTS:21021675::"Denies"} Difficulty getting out of chair: {ACTIONS;DENIES/REPORTS:21021675::"Denies"} Difficulty using hands for taps, buttons, cutlery, and/or writing: {ACTIONS;DENIES/REPORTS:21021675::"Denies"}  No Rheumatology ROS completed.   PMFS History:  Patient Active Problem List   Diagnosis Date Noted  . Unresponsiveness 04/14/2018  . Hyperkalemia 04/13/2018  . Intractable nausea and vomiting 04/12/2018  . Abnormal glucose 03/01/2018  . FHx: heart disease 03/01/2018  . Therapeutic opioid-induced constipation (OIC) 12/21/2017  . Abnormal UGI series 12/21/2017  . CKD (chronic kidney disease) stage 3, GFR 30-59 ml/min (HCC) 09/27/2017  . T12 compression fracture ([place]) 09/26/2017  . Chronic bronchitis ([place]) 08/09/2017  . Atherosclerosis of aorta ([place]) 04/05/2017  . Costochondritis 11/14/2016  . Anxiety state 06/08/2016  . Spondylolisthesis, lumbar region 04/13/2016  . Dysphagia   . Lumbar radiculopathy, acute 12/07/2015    Class: Chronic  . Spinal stenosis, lumbar region, with neurogenic claudication 05/29/2015  . DDD (degenerative disc disease), lumbar 04/21/2015  . Hyperlipidemia, mixed 11/24/2014  . Gastroesophageal reflux disease 11/24/2014  . Depression, major, in partial remission ([place]) 09/26/2014  . Chronic pain syndrome 09/26/2014  . Coronary  artery disease due to lipid rich plaque   . Osteoporosis 02/10/2014  . Prediabetes 08/14/2013  . Vitamin D deficiency 08/14/2013  . Medication management 08/14/2013  . Constipation, chronic 04/03/2013  . MGUS (monoclonal gammopathy of unknown significance) 03/05/2013  . Herniated lumbar intervertebral disc 06/25/2012    Class: Acute  . Vertebral compression fracture ([place]) 06/23/2012  . ESOPHAGEAL STRICTURE 08/28/2008  . Mild intermittent asthma without complication 94/70/9628  . Incarcerated recurrent paraesophageal hiatal hernia s/p lap redo repair [id number] 01/31/2008  . Essential hypertension 07/25/2007    Past Medical History:  Diagnosis Date  . Anxiety    takes Xanax daily as needed  . Asthma    Albuterol daily as needed  . Blood transfusion without reported diagnosis   . CAD (coronary artery disease)    LHC 2003 with 40% pLAD, 30% mLAD, 100% D1 (moderate vessel), 100%  D2 (small vessel), 95% mid small OM2.  Pt had PTCA to D1;  D2 and OM2 small vessels and tx medically;  Last Myoview (2012): anterior infarct seen with scar, no ischemia. EF normal. Med Rx // Myoview (12/14):  Low risk; ant scar w/ peri-infarct ischemia; EF 55% with ant AK // Myoview 5/16: EF 55-65, ant, apical scar, no ischemia   . Cataract   . Chronic back pain    HNP, DDD, spinal stenosis, vertebral compression fractures.   . Chronic nausea    takes Zofran daily as needed.  normal gastric emptying study 03/2013  . CKD (chronic kidney disease), stage III ([place]) 09/27/2017  . Constipation    felt to be functional. takes Senokot and Miralax daily.  treated with Linzess in past.   . DIVERTICULOSIS, COLON 08/28/2008   Qualifier: Diagnosis of  By: [name], [name], [name]    .  Elevated LFTs 2015   probably med induced DILI, from Augmentin vs Pravastatin  . GERD (gastroesophageal reflux disease)    hx of esophageal stricture   . Hiatal hernia    Paraesophageal hernias. s/p fundoplications in 5329 and 04/2013  . History  of bronchitis several of yrs ago  . History of colon polyps 03/2009, 03/2011   adenomatous colon polyps, no high grade dysplasia.   [name] History of vertigo    no meds  . Hyperlipidemia    takes Pravastatin daily  . Hypertension    takes Amlodipine,Metoprolol,and Losartan daily  . Nausea 03/2016  . Osteoporosis   . PONV (postoperative nausea and vomiting)    yrs ago  . Slow urinary stream    takes Rapaflo daily  . TIA (transient ischemic attack) 1995   no residual effects noted    Family History  Problem Relation Age of Onset  . Colon cancer Mother 38  . Heart attack Father 87       heart attack  . Brain cancer Brother        tumor   . Heart disease Brother   . Heart disease Brother   . Kidney disease Daughter   . Esophageal cancer Neg Hx   . Stomach cancer Neg Hx    Past Surgical History:  Procedure Laterality Date  . APPENDECTOMY     patient unsure of date  . BACK SURGERY    . BLADDER REPAIR    . CHOLECYSTECTOMY     Patient unsure of date.  . CORONARY ANGIOPLASTY  11/16/2001   1 stent  . ESOPHAGEAL MANOMETRY N/A 03/25/2013   Procedure: ESOPHAGEAL MANOMETRY (EM);  Surgeon: Inda Castle, MD;  [facility];  Service: Endoscopy;  Laterality: N/A;  . ESOPHAGOGASTRODUODENOSCOPY N/A 03/15/2013   Procedure: ESOPHAGOGASTRODUODENOSCOPY (EGD);  Surgeon: Jerene Bears, MD;  Location: [facility] ENDOSCOPY;  Service: Endoscopy;  Laterality: N/A;  . ESOPHAGOGASTRODUODENOSCOPY N/A 12/25/2015   Procedure: ESOPHAGOGASTRODUODENOSCOPY (EGD);  Surgeon: Doran Stabler, MD;  Location: [facility] ENDOSCOPY;  Service: Endoscopy;  Laterality: N/A;  . EYE SURGERY  11/2000   bilateral cataracts with lens implant  . FIXATION KYPHOPLASTY LUMBAR SPINE  X 2   "L3-4"  . HEMORRHOID SURGERY    . HIATAL HERNIA REPAIR  06/03/2011   Procedure: LAPAROSCOPIC REPAIR OF HIATAL HERNIA;  Surgeon: Adin Hector, MD;  [facility];  Service: General;  Laterality: N/A;  . HIATAL HERNIA REPAIR N/A 04/24/2013     Procedure: LAPAROSCOPIC  REPAIR RECURRENT PARASOPHAGEAL HIATAL HERNIA WITH FUNDOPLICATION;  Surgeon: Adin Hector, MD;  [facility]: [facility];  Service: General;  Laterality: N/A;  . INSERTION OF MESH N/A 04/24/2013   Procedure: INSERTION OF MESH;  Surgeon: Adin Hector, MD;  [facility];  Service: General;  Laterality: N/A;  . IR KYPHO THORACIC WITH BONE BIOPSY  09/29/2017  . IR KYPHO THORACIC WITH BONE BIOPSY  11/03/2017  . LAPAROSCOPIC LYSIS OF ADHESIONS N/A 04/24/2013   Procedure: LAPAROSCOPIC LYSIS OF ADHESIONS;  Surgeon: Adin Hector, MD;  [facility];  Service: General;  Laterality: N/A;  . LAPAROSCOPIC NISSEN FUNDOPLICATION  01/30/2682   Procedure: LAPAROSCOPIC NISSEN FUNDOPLICATION;  Surgeon: Adin Hector, MD;  [facility];  Service: General;  Laterality: N/A;  . LUMBAR FUSION  12/07/2015   Right L2-3 facetectomy, posterolateral fusion L2-3 with pedicle screws, rods, local bone graft, Vivigen cancellous chips. Fusion extended to the L1 level with pedicle screws and rods  . LUMBAR  LAMINECTOMY/DECOMPRESSION MICRODISCECTOMY Right 06/26/2012   Procedure: LUMBAR LAMINECTOMY/DECOMPRESSION MICRODISCECTOMY;  Surgeon: Jessy Oto, MD;  Location: [facility];  Service: Orthopedics;  Laterality: Right;  RIGHT L4-5 MICRODISCECTOMY  . LUMBAR LAMINECTOMY/DECOMPRESSION MICRODISCECTOMY N/A 05/29/2015   Procedure: RIGHT L2-3 MICRODISCECTOMY;  Surgeon: Jessy Oto, MD;  Location: [facility];  Service: Orthopedics;  Laterality: N/A;  . TOTAL ABDOMINAL HYSTERECTOMY  1975   partial  . UPPER GASTROINTESTINAL ENDOSCOPY     Social History   Social History Narrative   Lives with husband.   Right-handed.   3 cups caffeine per day.    Objective: Vital Signs: There were no vitals taken for this visit.   Physical Exam   Musculoskeletal Exam: ***  CDAI Exam: CDAI Score: Not documented Patient Global Assessment: Not documented; Provider Global Assessment: Not documented Swollen: Not  documented; Tender: Not documented Joint Exam   Not documented   There is currently no information documented on the homunculus. Go to the Rheumatology activity and complete the homunculus joint exam.  Investigation: No additional findings.  Imaging: Dg Abd Acute W/chest  Result Date: 04/12/2018 CLINICAL DATA:  Intractable nausea and vomiting for a day EXAM: DG ABDOMEN ACUTE W/ 1V CHEST COMPARISON:  None. FINDINGS: Mild cardiomegaly. Minimal aortic atherosclerosis. No aneurysm. Clear lungs. No free air beneath the diaphragm. Bowel gas pattern is unremarkable without obstructive pattern. Phleboliths are present bilaterally within the pelvis. Kyphoplasties at T11, T12 L2, L3 and L4 with spinal fusion hardware from L1 through L3. Cholecystectomy clips are present. IMPRESSION: Negative abdominal radiographs. No acute cardiopulmonary disease. Electronically Signed   By: Ashley Royalty M.D.   On: 04/12/2018 23:52    Recent Labs: Lab Results  Component Value Date   WBC 8.9 04/17/2018   HGB 14.3 04/17/2018   PLT 316 04/17/2018   NA 139 04/17/2018   K 4.6 04/17/2018   CL 101 04/17/2018   CO2 25 04/17/2018   GLUCOSE 83 04/17/2018   BUN 18 04/17/2018   CREATININE 0.79 04/17/2018   BILITOT 0.7 04/17/2018   ALKPHOS 44 04/14/2018   AST 21 04/17/2018   ALT 18 04/17/2018   PROT 7.1 04/17/2018   ALBUMIN 3.1 (L) 04/14/2018   CALCIUM 10.2 04/17/2018   GFRAA 82 04/17/2018    Speciality Comments: No specialty comments available.  Procedures:  No procedures performed Allergies: Ace inhibitors; Aspartame; Atorvastatin; Biaxin [clarithromycin]; Daliresp [roflumilast]; Erythromycin; and Levofloxacin   Assessment / Plan:     Visit Diagnoses: No diagnosis found.   Orders: No orders of the defined types were placed in this encounter.  No orders of the defined types were placed in this encounter.   Face-to-face time spent with patient was *** minutes. Greater than 50% of time was spent in  counseling and coordination of care.  Follow-Up Instructions: No follow-ups on file.   Earnestine Mealing, CMA  Note - This record has been created using Editor, commissioning.  Chart creation errors have been sought, but may not always  have been located. Such creation errors do not reflect on  the standard of medical care.

## 2018-05-08 ENCOUNTER — Other Ambulatory Visit: Payer: Self-pay | Admitting: Internal Medicine

## 2018-05-08 DIAGNOSIS — R450 Nervousness: Secondary | ICD-10-CM

## 2018-05-08 MED ORDER — ALPRAZOLAM 0.5 MG PO TABS: ORAL_TABLET | ORAL | 0 refills | Status: DC

## 2018-05-09 ENCOUNTER — Other Ambulatory Visit: Payer: Self-pay | Admitting: Adult Health

## 2018-05-09 DIAGNOSIS — R450 Nervousness: Secondary | ICD-10-CM

## 2018-05-12 ENCOUNTER — Emergency Department (HOSPITAL_COMMUNITY): Payer: PPO

## 2018-05-12 ENCOUNTER — Other Ambulatory Visit: Payer: Self-pay

## 2018-05-12 ENCOUNTER — Emergency Department (HOSPITAL_COMMUNITY)
Admission: EM | Admit: 2018-05-12 | Discharge: 2018-05-12 | Disposition: A | Discharge status: Home / Self Care | Payer: PPO | Source: Home / Self Care | Attending: Emergency Medicine | Admitting: Emergency Medicine

## 2018-05-12 ENCOUNTER — Encounter (HOSPITAL_COMMUNITY): Payer: Self-pay | Admitting: *Deleted

## 2018-05-12 DIAGNOSIS — J4520 Mild intermittent asthma, uncomplicated: Secondary | ICD-10-CM | POA: Diagnosis not present

## 2018-05-12 DIAGNOSIS — I2583 Coronary atherosclerosis due to lipid rich plaque: Secondary | ICD-10-CM | POA: Diagnosis not present

## 2018-05-12 DIAGNOSIS — I10 Essential (primary) hypertension: Secondary | ICD-10-CM | POA: Diagnosis not present

## 2018-05-12 DIAGNOSIS — R112 Nausea with vomiting, unspecified: Secondary | ICD-10-CM

## 2018-05-12 DIAGNOSIS — R1031 Right lower quadrant pain: Secondary | ICD-10-CM | POA: Diagnosis not present

## 2018-05-12 DIAGNOSIS — R109 Unspecified abdominal pain: Secondary | ICD-10-CM | POA: Insufficient documentation

## 2018-05-12 DIAGNOSIS — Z90710 Acquired absence of both cervix and uterus: Secondary | ICD-10-CM | POA: Diagnosis not present

## 2018-05-12 DIAGNOSIS — K5900 Constipation, unspecified: Secondary | ICD-10-CM | POA: Diagnosis not present

## 2018-05-12 DIAGNOSIS — Z9049 Acquired absence of other specified parts of digestive tract: Secondary | ICD-10-CM | POA: Diagnosis not present

## 2018-05-12 DIAGNOSIS — R1011 Right upper quadrant pain: Secondary | ICD-10-CM | POA: Diagnosis not present

## 2018-05-12 DIAGNOSIS — I2510 Atherosclerotic heart disease of native coronary artery without angina pectoris: Secondary | ICD-10-CM | POA: Diagnosis not present

## 2018-05-12 DIAGNOSIS — N183 Chronic kidney disease, stage 3 (moderate): Secondary | ICD-10-CM | POA: Diagnosis not present

## 2018-05-12 DIAGNOSIS — Z9842 Cataract extraction status, left eye: Secondary | ICD-10-CM | POA: Diagnosis not present

## 2018-05-12 DIAGNOSIS — E860 Dehydration: Secondary | ICD-10-CM | POA: Diagnosis not present

## 2018-05-12 DIAGNOSIS — E782 Mixed hyperlipidemia: Secondary | ICD-10-CM | POA: Diagnosis not present

## 2018-05-12 DIAGNOSIS — K529 Noninfective gastroenteritis and colitis, unspecified: Secondary | ICD-10-CM | POA: Diagnosis not present

## 2018-05-12 DIAGNOSIS — Z955 Presence of coronary angioplasty implant and graft: Secondary | ICD-10-CM | POA: Diagnosis not present

## 2018-05-12 DIAGNOSIS — M810 Age-related osteoporosis without current pathological fracture: Secondary | ICD-10-CM | POA: Diagnosis not present

## 2018-05-12 DIAGNOSIS — E876 Hypokalemia: Secondary | ICD-10-CM | POA: Diagnosis not present

## 2018-05-12 DIAGNOSIS — F419 Anxiety disorder, unspecified: Secondary | ICD-10-CM | POA: Diagnosis not present

## 2018-05-12 DIAGNOSIS — Z8673 Personal history of transient ischemic attack (TIA), and cerebral infarction without residual deficits: Secondary | ICD-10-CM | POA: Diagnosis not present

## 2018-05-12 DIAGNOSIS — G894 Chronic pain syndrome: Secondary | ICD-10-CM | POA: Diagnosis not present

## 2018-05-12 DIAGNOSIS — Z7982 Long term (current) use of aspirin: Secondary | ICD-10-CM | POA: Diagnosis not present

## 2018-05-12 DIAGNOSIS — K219 Gastro-esophageal reflux disease without esophagitis: Secondary | ICD-10-CM | POA: Diagnosis not present

## 2018-05-12 DIAGNOSIS — I4891 Unspecified atrial fibrillation: Secondary | ICD-10-CM | POA: Diagnosis not present

## 2018-05-12 DIAGNOSIS — R1110 Vomiting, unspecified: Secondary | ICD-10-CM

## 2018-05-12 DIAGNOSIS — I1310 Hypertensive heart and chronic kidney disease without heart failure, with stage 1 through stage 4 chronic kidney disease, or unspecified chronic kidney disease: Secondary | ICD-10-CM | POA: Diagnosis not present

## 2018-05-12 DIAGNOSIS — R51 Headache: Secondary | ICD-10-CM | POA: Diagnosis not present

## 2018-05-12 DIAGNOSIS — Z981 Arthrodesis status: Secondary | ICD-10-CM | POA: Diagnosis not present

## 2018-05-12 DIAGNOSIS — Z9841 Cataract extraction status, right eye: Secondary | ICD-10-CM | POA: Diagnosis not present

## 2018-05-12 DIAGNOSIS — D472 Monoclonal gammopathy: Secondary | ICD-10-CM | POA: Diagnosis not present

## 2018-05-12 DIAGNOSIS — Z961 Presence of intraocular lens: Secondary | ICD-10-CM | POA: Diagnosis not present

## 2018-05-12 LAB — COMPREHENSIVE METABOLIC PANEL
ALT: 19 U/L (ref 0–44)
AST: 23 U/L (ref 15–41)
Albumin: 4.1 g/dL (ref 3.5–5.0)
Alkaline Phosphatase: 52 U/L (ref 38–126)
Anion gap: 10 (ref 5–15)
BUN: 9 mg/dL (ref 8–23)
CO2: 21 mmol/L — ABNORMAL LOW (ref 22–32)
Calcium: 9.3 mg/dL (ref 8.9–10.3)
Chloride: 106 mmol/L (ref 98–111)
Creatinine, Ser: 0.76 mg/dL (ref 0.44–1.00)
GFR calc Af Amer: >60 mL/min (ref >60)
GFR calc non Af Amer: >60 mL/min (ref >60)
Glucose, Bld: 151 mg/dL — ABNORMAL HIGH (ref 70–99)
Potassium: 3.8 mmol/L (ref 3.5–5.1)
Sodium: 137 mmol/L (ref 135–145)
Total Bilirubin: 0.7 mg/dL (ref 0.3–1.2)
Total Protein: 7.5 g/dL (ref 6.5–8.1)

## 2018-05-12 LAB — CBC WITH DIFFERENTIAL/PLATELET
Abs Immature Granulocytes: 0.02 10*3/uL (ref 0.00–0.07)
BASOS PCT: 0 %
Basophils Absolute: 0 10*3/uL (ref 0.0–0.1)
Eosinophils Absolute: 0 10*3/uL (ref 0.0–0.5)
Eosinophils Relative: 1 %
HCT: 45.2 % (ref 36.0–46.0)
Hemoglobin: 14.9 g/dL (ref 12.0–15.0)
Immature Granulocytes: 0 %
Lymphocytes Relative: 13 %
Lymphs Abs: 1.1 10*3/uL (ref 0.7–4.0)
MCH: 31.3 pg (ref 26.0–34.0)
MCHC: 33 g/dL (ref 30.0–36.0)
MCV: 95 fL (ref 80.0–100.0)
Monocytes Absolute: 0.3 10*3/uL (ref 0.1–1.0)
Monocytes Relative: 4 %
Neutro Abs: 7 10*3/uL (ref 1.7–7.7)
Neutrophils Relative %: 82 %
Platelets: 315 10*3/uL (ref 150–400)
RBC: 4.76 MIL/uL (ref 3.87–5.11)
RDW: 13.2 % (ref 11.5–15.5)
WBC: 8.6 10*3/uL (ref 4.0–10.5)
nRBC: 0 % (ref 0.0–0.2)

## 2018-05-12 LAB — URINALYSIS, ROUTINE W REFLEX MICROSCOPIC
Bilirubin Urine: NEGATIVE
Glucose, UA: NEGATIVE mg/dL
Hgb urine dipstick: NEGATIVE
Ketones, ur: 5 mg/dL — AB
Leukocytes, UA: NEGATIVE
Nitrite: NEGATIVE
PH: 8 (ref 5.0–8.0)
Protein, ur: NEGATIVE mg/dL
SPECIFIC GRAVITY, URINE: 1.015 (ref 1.005–1.030)

## 2018-05-12 LAB — LIPASE, BLOOD: Lipase: 21 U/L (ref 11–51)

## 2018-05-12 MED ORDER — ONDANSETRON 8 MG PO TBDP: ORAL_TABLET | ORAL | 0 refills | Status: DC

## 2018-05-12 MED ORDER — MORPHINE SULFATE (PF) 4 MG/ML IV SOLN
4.0000 mg | Freq: Once | INTRAVENOUS | Status: AC
  Administered 2018-05-12: 4 mg via INTRAVENOUS
  Filled 2018-05-12: qty 1

## 2018-05-12 MED ORDER — METOCLOPRAMIDE HCL 5 MG/ML IJ SOLN
5.0000 mg | Freq: Once | INTRAMUSCULAR | Status: AC
  Administered 2018-05-12: 5 mg via INTRAVENOUS
  Filled 2018-05-12: qty 2

## 2018-05-12 MED ORDER — PROMETHAZINE HCL 25 MG/ML IJ SOLN
12.5000 mg | Freq: Once | INTRAMUSCULAR | Status: AC
  Administered 2018-05-12: 12.5 mg via INTRAVENOUS
  Filled 2018-05-12: qty 1

## 2018-05-12 MED ORDER — ONDANSETRON HCL 4 MG/2ML IJ SOLN
4.0000 mg | Freq: Once | INTRAMUSCULAR | Status: AC
  Administered 2018-05-12: 4 mg via INTRAVENOUS
  Filled 2018-05-12: qty 2

## 2018-05-12 MED ORDER — IOHEXOL 300 MG/ML  SOLN
100.0000 mL | Freq: Once | INTRAMUSCULAR | Status: AC | PRN
  Administered 2018-05-12: 100 mL via INTRAVENOUS

## 2018-05-12 NOTE — ED Provider Notes (Addendum)
[facility] EMERGENCY DEPARTMENT Provider Note   CSN: [id number] Arrival date & time: 05/12/18  0944     History   Chief Complaint Chief Complaint  Patient presents with  . Abdominal Pain    HPI [name] [name] is a 83 y.o. female.  HPI Complains of right-sided abdominal pain onset 4 PM yesterday constant.  Nonradiating symptoms accompanied by multiple episodes of dry heaves and nausea.  Last bowel movement yesterday 4 PM loose, not watery.  No blood per rectum.  Nothing makes pain better or worse.  No treatment prior to coming here.  No chest pain no shortness of breath.  She does admit to mild cough for the past few days.  No other associated symptoms. Past Medical History:  Diagnosis Date  . Anxiety    takes Xanax daily as needed  . Asthma    Albuterol daily as needed  . Blood transfusion without reported diagnosis   . CAD (coronary artery disease)    LHC 2003 with 40% pLAD, 30% mLAD, 100% D1 (moderate vessel), 100%  D2 (small vessel), 95% mid small OM2.  Pt had PTCA to D1;  D2 and OM2 small vessels and tx medically;  Last Myoview (2012): anterior infarct seen with scar, no ischemia. EF normal. Med Rx // Myoview (12/14):  Low risk; ant scar w/ peri-infarct ischemia; EF 55% with ant AK // Myoview 5/16: EF 55-65, ant, apical scar, no ischemia   . Cataract   . Chronic back pain    HNP, DDD, spinal stenosis, vertebral compression fractures.   . Chronic nausea    takes Zofran daily as needed.  normal gastric emptying study 03/2013  . CKD (chronic kidney disease), stage III ([place]) 09/27/2017  . Constipation    felt to be functional. takes Senokot and Miralax daily.  treated with Linzess in past.   . DIVERTICULOSIS, COLON 08/28/2008   Qualifier: Diagnosis of  By: [name], [name], [name]    . Elevated LFTs 2015   probably med induced DILI, from Augmentin vs Pravastatin  . GERD (gastroesophageal reflux disease)    hx of esophageal stricture   . Hiatal hernia    Paraesophageal hernias. s/p fundoplications in 2536 and 04/2013  . History of bronchitis several of yrs ago  . History of colon polyps 03/2009, 03/2011   adenomatous colon polyps, no high grade dysplasia.   [name] History of vertigo    no meds  . Hyperlipidemia    takes Pravastatin daily  . Hypertension    takes Amlodipine,Metoprolol,and Losartan daily  . Nausea 03/2016  . Osteoporosis   . PONV (postoperative nausea and vomiting)    yrs ago  . Slow urinary stream    takes Rapaflo daily  . TIA (transient ischemic attack) 1995   no residual effects noted    Patient Active Problem List   Diagnosis Date Noted  . Unresponsiveness 04/14/2018  . Hyperkalemia 04/13/2018  . Intractable nausea and vomiting 04/12/2018  . Abnormal glucose 03/01/2018  . FHx: heart disease 03/01/2018  . Therapeutic opioid-induced constipation (OIC) 12/21/2017  . Abnormal UGI series 12/21/2017  . CKD (chronic kidney disease) stage 3, GFR 30-59 ml/min ([facility]) 09/27/2017  . T12 compression fracture ([place]) 09/26/2017  . Chronic bronchitis ([place]) 08/09/2017  . Atherosclerosis of aorta ([place]) 04/05/2017  . Costochondritis 11/14/2016  . Anxiety state 06/08/2016  . Spondylolisthesis, lumbar region 04/13/2016  . Dysphagia   . Lumbar radiculopathy, acute 12/07/2015    Class: Chronic  . Spinal stenosis, lumbar region,  with neurogenic claudication 05/29/2015  . DDD (degenerative disc disease), lumbar 04/21/2015  . Hyperlipidemia, mixed 11/24/2014  . Gastroesophageal reflux disease 11/24/2014  . Depression, major, in partial remission ([place]) 09/26/2014  . Chronic pain syndrome 09/26/2014  . Coronary artery disease due to lipid rich plaque   . Osteoporosis 02/10/2014  . Prediabetes 08/14/2013  . Vitamin D deficiency 08/14/2013  . Medication management 08/14/2013  . Constipation, chronic 04/03/2013  . MGUS (monoclonal gammopathy of unknown significance) 03/05/2013  . Herniated lumbar intervertebral disc 06/25/2012     Class: Acute  . Vertebral compression fracture ([place]) 06/23/2012  . ESOPHAGEAL STRICTURE 08/28/2008  . Mild intermittent asthma without complication 94/17/4081  . Incarcerated recurrent paraesophageal hiatal hernia s/p lap redo repair [id number] 01/31/2008  . Essential hypertension 07/25/2007    Past Surgical History:  Procedure Laterality Date  . APPENDECTOMY     patient unsure of date  . BACK SURGERY    . BLADDER REPAIR    . CHOLECYSTECTOMY     Patient unsure of date.  . CORONARY ANGIOPLASTY  11/16/2001   1 stent  . ESOPHAGEAL MANOMETRY N/A 03/25/2013   Procedure: ESOPHAGEAL MANOMETRY (EM);  Surgeon: Inda Castle, MD;  Location: [facility] ENDOSCOPY;  Service: Endoscopy;  Laterality: N/A;  . ESOPHAGOGASTRODUODENOSCOPY N/A 03/15/2013   Procedure: ESOPHAGOGASTRODUODENOSCOPY (EGD);  Surgeon: Jerene Bears, MD;  Location: [facility] ENDOSCOPY;  Service: Endoscopy;  Laterality: N/A;  . ESOPHAGOGASTRODUODENOSCOPY N/A 12/25/2015   Procedure: ESOPHAGOGASTRODUODENOSCOPY (EGD);  Surgeon: Doran Stabler, MD;  Location: [facility] ENDOSCOPY;  Service: Endoscopy;  Laterality: N/A;  . EYE SURGERY  11/2000   bilateral cataracts with lens implant  . FIXATION KYPHOPLASTY LUMBAR SPINE  X 2   "L3-4"  . HEMORRHOID SURGERY    . HIATAL HERNIA REPAIR  06/03/2011   Procedure: LAPAROSCOPIC REPAIR OF HIATAL HERNIA;  Surgeon: Adin Hector, MD;  Location: [facility] ORS;  Service: General;  Laterality: N/A;  . HIATAL HERNIA REPAIR N/A 04/24/2013   Procedure: LAPAROSCOPIC  REPAIR RECURRENT PARASOPHAGEAL HIATAL HERNIA WITH FUNDOPLICATION;  Surgeon: Adin Hector, MD;  Location: [facility] ORS;  Service: General;  Laterality: N/A;  . INSERTION OF MESH N/A 04/24/2013   Procedure: INSERTION OF MESH;  Surgeon: Adin Hector, MD;  Location: [facility] ORS;  Service: General;  Laterality: N/A;  . IR KYPHO THORACIC WITH BONE BIOPSY  09/29/2017  . IR KYPHO THORACIC WITH BONE BIOPSY  11/03/2017  . LAPAROSCOPIC LYSIS OF ADHESIONS N/A 04/24/2013   Procedure:  LAPAROSCOPIC LYSIS OF ADHESIONS;  Surgeon: Adin Hector, MD;  Location: [facility] ORS;  Service: General;  Laterality: N/A;  . LAPAROSCOPIC NISSEN FUNDOPLICATION  07/20/9700   Procedure: LAPAROSCOPIC NISSEN FUNDOPLICATION;  Surgeon: Adin Hector, MD;  Location: [facility] ORS;  Service: General;  Laterality: N/A;  . LUMBAR FUSION  12/07/2015   Right L2-3 facetectomy, posterolateral fusion L2-3 with pedicle screws, rods, local bone graft, Vivigen cancellous chips. Fusion extended to the L1 level with pedicle screws and rods  . LUMBAR LAMINECTOMY/DECOMPRESSION MICRODISCECTOMY Right 06/26/2012   Procedure: LUMBAR LAMINECTOMY/DECOMPRESSION MICRODISCECTOMY;  Surgeon: Jessy Oto, MD;  Location: [facility];  Service: Orthopedics;  Laterality: Right;  RIGHT L4-5 MICRODISCECTOMY  . LUMBAR LAMINECTOMY/DECOMPRESSION MICRODISCECTOMY N/A 05/29/2015   Procedure: RIGHT L2-3 MICRODISCECTOMY;  Surgeon: Jessy Oto, MD;  Location: [facility];  Service: Orthopedics;  Laterality: N/A;  . TOTAL ABDOMINAL HYSTERECTOMY  1975   partial  . UPPER GASTROINTESTINAL ENDOSCOPY       OB History   No obstetric history  on file.      Home Medications    Prior to Admission medications   Medication Sig Start Date End Date Taking? Authorizing Provider  acetaminophen (TYLENOL) 500 MG tablet Take 1 tablet (500 mg total) by mouth every 6 (six) hours as needed. Patient taking differently: Take 500 mg by mouth every 6 (six) hours as needed for mild pain.  03/18/18   Loura Halt A, NP  albuterol (PROAIR HFA) 108 (90 Base) MCG/ACT inhaler Inhale 2 puffs into the lungs every 6 (six) hours as needed for wheezing or shortness of breath. 09/22/16   Vicie Mutters, PA-C  alendronate (FOSAMAX) 70 MG tablet Take with a full glass of water on an empty stomach. Patient taking differently: Take 70 mg by mouth once a week. Take with a full glass of water on an empty stomach. 02/16/18   [name], MD  ALPRAZolam Duanne Moron) 0.5 MG tablet Take 1/2 tablet  1- 2 x /day ONLY if needed for Anxiety Attack &  limit to 5 days /week to avoid addiction (Last till Mar 30) 05/08/18   [name], MD  amLODipine (NORVASC) 10 MG tablet TAKE 1 TABLET BY MOUTH AT BEDTIME Patient taking differently: Take 10 mg by mouth at bedtime.  09/08/17   [name], MD  aspirin EC 81 MG tablet Take 81 mg by mouth daily.    [provider]  Cholecalciferol (VITAMIN D3) 5000 units CAPS Take 5,000 Units by mouth 2 (two) times daily.     [provider]  diclofenac sodium (VOLTAREN) 1 % GEL Apply 2 g topically 4 (four) times daily. Patient taking differently: Apply 2 g topically 4 (four) times daily as needed (pain).  03/18/18   Bast, Tressia Miners A, NP  dicyclomine (BENTYL) 10 MG capsule TAKE 1 CAPSULE BY MOUTH EVERY 8 HOURS AS NEEDED FOR SPASM Patient taking differently: Take 10 mg by mouth 3 (three) times daily as needed for spasms.  02/05/18   Armbruster, Carlota Raspberry, MD  gabapentin (NEURONTIN) 300 MG capsule TAKE 1 CAPSULE BY MOUTH THREE TIMES DAILY FOR  CHRONIC  PAIN. 10/06/17   [name], MD  ipratropium-albuterol (DUONEB) 0.5-2.5 (3) MG/3ML SOLN Take 3 mLs by nebulization every 6 (six) hours as needed (SOB).     [provider]  ketoconazole (NIZORAL) 2 % cream  APPLY ONE APPLICATION TOPICALLY TWO TIMES DAILY Patient taking differently: Apply 1 application topically 2 (two) times daily as needed for irritation.  04/02/18   [name], MD  losartan (COZAAR) 50 MG tablet Take 1 tablet (50 mg total) by mouth daily. 04/15/18   Black, Lezlie Octave, NP  lubiprostone (AMITIZA) 8 MCG capsule Take 1 capsule (8 mcg total) by mouth 2 (two) times daily with a meal. Patient not taking: Reported on 04/12/2018 12/21/17   Zehr, Laban Emperor, PA-C  MAGNESIUM PO Take 1 tablet by mouth daily.     [provider]  meclizine (ANTIVERT) 25 MG tablet TAKE ONE-HALF TO ONE TABLET BY MOUTH UP TO THREE TIMES DAILY FOR MOTION SICKNESS/DIZZINESS 04/20/18   Vicie Mutters, PA-C  metoprolol tartrate (LOPRESSOR) 50 MG tablet Take 25 mg by mouth at bedtime.     [provider]  mometasone-formoterol (DULERA) 100-5 MCG/ACT AERO Inhale 2 puffs into the lungs every 12 (twelve) hours. Patient taking differently: Inhale 2 puffs into the lungs every 12 (twelve) hours as needed for wheezing or shortness of breath.  09/08/17   [name], MD  nitroGLYCERIN (NITROSTAT) 0.4 MG SL tablet  Place 1 tablet (0.4 mg total) under the tongue every 5 (five) minutes as needed for chest pain. 09/04/14   Larey Dresser, MD  omeprazole (PRILOSEC) 20 MG capsule Take 1 capsule daily for heartburn, Reflux & Nausea 04/17/18   [name], MD  ondansetron (ZOFRAN-ODT) 8 MG disintegrating tablet DISSOLVE ONE TABLET IN MOUTH EVERY 8 HOURS AS NEEDED FOR NAUSEA AND  VOMITING Patient taking differently: Take 8 mg by mouth every 8 (eight) hours as needed for nausea.  10/05/17   [name], NP  OVER THE COUNTER MEDICATION Take 1,000 mg by mouth 2 (two) times daily. [place] Calcium 1000mg     [provider]  oxyCODONE-acetaminophen (PERCOCET/ROXICET) 5-325 MG tablet Take 1 tablet by mouth every 4 (four) hours as needed for severe pain. 03/10/17   Tegeler, Gwenyth Allegra, MD  polyethylene glycol powder (GLYCOLAX/MIRALAX) powder Take 17 g by mouth 2 (two) times daily. Patient taking differently: Take 17 g by mouth daily.  05/16/17   Armbruster, Carlota Raspberry, MD  pravastatin (PRAVACHOL) 40 MG tablet TAKE 1 TABLET BY MOUTH DAILY Patient taking differently: Take 40 mg by mouth daily.  03/14/18   [name], MD  promethazine (PHENERGAN) 25 MG tablet Take 1 tablet (25 mg total) by mouth every 6 (six) hours as needed for nausea or vomiting. 11/11/17   Danford, Suann Larry, MD  senna-docusate (SENOKOT-S) 8.6-50 MG tablet Take 2 tablets by mouth 2 (two) times daily. Patient taking differently: Take 1 tablet by mouth 2 (two) times daily.  12/26/15   Roxan Hockey, MD  sucralfate  (CARAFATE) 1 g tablet Take 1 tablet 2 to 4 x /day before meals for Heartburn 04/17/18   [name], MD  tamsulosin (FLOMAX) 0.4 MG CAPS capsule Take 0.4 mg by mouth daily. 03/07/18   [provider]    Family History Family History  Problem Relation Age of Onset  . Colon cancer Mother 1  . Heart attack Father 45       heart attack  . Brain cancer Brother        tumor   . Heart disease Brother   . Heart disease Brother   . Kidney disease Daughter   . Esophageal cancer Neg Hx   . Stomach cancer Neg Hx     Social History Social History   Tobacco Use  . Smoking status: Never Smoker  . Smokeless tobacco: Never Used  Substance Use Topics  . Alcohol use: No  . Drug use: Never     Allergies   Ace inhibitors; Aspartame; Atorvastatin; Biaxin [clarithromycin]; Daliresp [roflumilast]; Erythromycin; and Levofloxacin   Review of Systems Review of Systems  Constitutional: Negative.   HENT: Negative.   Respiratory: Positive for cough.   Cardiovascular: Negative.   Gastrointestinal: Positive for abdominal pain and nausea.  Musculoskeletal: Negative.   Skin: Negative.   Neurological: Negative.   Psychiatric/Behavioral: Negative.   All other systems reviewed and are negative.    Physical Exam Updated Vital Signs BP (!) 138/101   Pulse 95   Resp (!) 30   Ht 5\' 3"  (1.6 m)   Wt 73.9 kg   SpO2 98%   BMI 28.86 kg/m   Physical Exam Vitals signs and nursing note reviewed.  Constitutional:      Appearance: She is well-developed. She is ill-appearing.     Comments: Actively dry heaving  HENT:     Head: Normocephalic and atraumatic.  Eyes:     Conjunctiva/sclera: Conjunctivae normal.     Pupils: Pupils  are equal, round, and reactive to light.  Neck:     Musculoskeletal: Neck supple.     Thyroid: No thyromegaly.     Trachea: No tracheal deviation.  Cardiovascular:     Rate and Rhythm: Normal rate and regular rhythm.     Heart sounds: No murmur.    Pulmonary:     Effort: Pulmonary effort is normal. No respiratory distress.     Breath sounds: Wheezing present.     Comments: End expiratory wheezes, minimal, bilateral Abdominal:     General: Bowel sounds are normal. There is no distension.     Palpations: Abdomen is soft.     Tenderness: There is abdominal tenderness. There is no guarding.     Comments: Tender right upper quadrant and right lower quadrant  Musculoskeletal: Normal range of motion.        General: No tenderness.  Skin:    General: Skin is warm and dry.     Findings: No rash.  Neurological:     Mental Status: She is alert.     Coordination: Coordination normal.      ED Treatments / Results  Labs (all labs ordered are listed, but only abnormal results are displayed) Labs Reviewed  COMPREHENSIVE METABOLIC PANEL  CBC WITH DIFFERENTIAL/PLATELET  LIPASE, BLOOD  URINALYSIS, ROUTINE W REFLEX MICROSCOPIC    EKG EKG Interpretation  Date/Time:  Saturday May 12 2018 10:05:05 EST Ventricular Rate:  87 PR Interval:    QRS Duration: 94 QT Interval:  364 QTC Calculation: 438 R Axis:   77 Text Interpretation:  Sinus rhythm Lateral infarct, age indeterminate No significant change since last tracing Confirmed by Orlie Dakin [phone]) on 05/12/2018 10:10:43 AM   Radiology No results found.  Procedures Procedures (including critical care time) Results for orders placed or performed during the hospital encounter of 05/12/18  Comprehensive metabolic panel  Result Value Ref Range   Sodium 137 135 - 145 mmol/L   Potassium 3.8 3.5 - 5.1 mmol/L   Chloride 106 98 - 111 mmol/L   CO2 21 (L) 22 - 32 mmol/L   Glucose, Bld 151 (H) 70 - 99 mg/dL   BUN 9 8 - 23 mg/dL   Creatinine, Ser 0.76 0.44 - 1.00 mg/dL   Calcium 9.3 8.9 - 10.3 mg/dL   Total Protein 7.5 6.5 - 8.1 g/dL   Albumin 4.1 3.5 - 5.0 g/dL   AST 23 15 - 41 U/L   ALT 19 0 - 44 U/L   Alkaline Phosphatase 52 38 - 126 U/L   Total Bilirubin 0.7 0.3 - 1.2  mg/dL   GFR calc non Af Amer >60 >60 mL/min   GFR calc Af Amer >60 >60 mL/min   Anion gap 10 5 - 15  CBC with Differential/Platelet  Result Value Ref Range   WBC 8.6 4.0 - 10.5 K/uL   RBC 4.76 3.87 - 5.11 MIL/uL   Hemoglobin 14.9 12.0 - 15.0 g/dL   HCT 45.2 36.0 - 46.0 %   MCV 95.0 80.0 - 100.0 fL   MCH 31.3 26.0 - 34.0 pg   MCHC 33.0 30.0 - 36.0 g/dL   RDW 13.2 11.5 - 15.5 %   Platelets 315 150 - 400 K/uL   nRBC 0.0 0.0 - 0.2 %   Neutrophils Relative % 82 %   Neutro Abs 7.0 1.7 - 7.7 K/uL   Lymphocytes Relative 13 %   Lymphs Abs 1.1 0.7 - 4.0 K/uL   Monocytes Relative 4 %  Monocytes Absolute 0.3 0.1 - 1.0 K/uL   Eosinophils Relative 1 %   Eosinophils Absolute 0.0 0.0 - 0.5 K/uL   Basophils Relative 0 %   Basophils Absolute 0.0 0.0 - 0.1 K/uL   Immature Granulocytes 0 %   Abs Immature Granulocytes 0.02 0.00 - 0.07 K/uL  Lipase, blood  Result Value Ref Range   Lipase 21 11 - 51 U/L  Urinalysis, Routine w reflex microscopic  Result Value Ref Range   Color, Urine YELLOW YELLOW   APPearance CLEAR CLEAR   Specific Gravity, Urine 1.015 1.005 - 1.030   pH 8.0 5.0 - 8.0   Glucose, UA NEGATIVE NEGATIVE mg/dL   Hgb urine dipstick NEGATIVE NEGATIVE   Bilirubin Urine NEGATIVE NEGATIVE   Ketones, ur 5 (A) NEGATIVE mg/dL   Protein, ur NEGATIVE NEGATIVE mg/dL   Nitrite NEGATIVE NEGATIVE   Leukocytes, UA NEGATIVE NEGATIVE   Dg Chest 2 View  Result Date: 05/12/2018 CLINICAL DATA:  Vomiting.  Headache.  Symptoms since last night. EXAM: CHEST - 2 VIEW COMPARISON:  04/12/2018 FINDINGS: The heart is mildly enlarged. Normal vascularity. Lungs are under aerated with linear atelectasis at the right lung base. No pneumothorax or pleural effusion. Status post vertebral augmentation and spinal instrumentation in the lower thoracic spine and lumbar spine. IMPRESSION: Cardiomegaly without decompensation. Electronically Signed   By: Marybelle Killings M.D.   On: 05/12/2018 11:00   Ct Abdomen Pelvis W  Contrast  Result Date: 05/12/2018 CLINICAL DATA:  Pt c/o nausea and lower abdominal pain that radiates to back, starting last night. Surgery: Part. Hysterectomy; cholecystectomy; appendectomy EXAM: CT ABDOMEN AND PELVIS WITH CONTRAST TECHNIQUE: Multidetector CT imaging of the abdomen and pelvis was performed using the standard protocol following bolus administration of intravenous contrast. CONTRAST:  117mL OMNIPAQUE IOHEXOL 300 MG/ML  SOLN COMPARISON:  11/08/2017 FINDINGS: Lower chest: No acute abnormality. Hepatobiliary: 7 mm cyst posterior aspect of segment 4B, stable. No other liver masses or lesions. Liver normal in overall size. Gallbladder surgically absent. There is intra and extrahepatic bile duct dilation. Common bile duct measures 14 mm. This is chronic and stable from the prior CT. Pancreas: Unremarkable. No pancreatic ductal dilatation or surrounding inflammatory changes. Spleen: Normal in size without focal abnormality. Adrenals/Urinary Tract: No adrenal masses. Kidneys normal in position with symmetric enhancement and excretion. Mild renal cortical thinning. 7 mm low-density lesion from the anterior upper pole the left kidney consistent with a cyst. Tiny low-density cortical lesion lateral midpole of the right kidney, also likely a cyst. No other renal masses, no stones and no hydronephrosis. Normal ureters. Normal bladder. Stomach/Bowel: Stable changes from a hiatal hernia repair. Stomach otherwise unremarkable. Small bowel and colon are normal in caliber. No wall thickening or inflammation. Vascular/Lymphatic: Aortic atherosclerosis. No enlarged abdominal or pelvic lymph nodes. Reproductive: Status post hysterectomy. No adnexal masses. Other: No abdominal wall hernia or abnormality. No abdominopelvic ascites. Musculoskeletal: Chronic compression fractures of T11, T12, L1, L2, L3 and L4. All but the L1 fracture have been treated with prior vertebroplasty. Posterior fusion hardware at L1, L2 and L3.  No acute fracture. No evidence of loosening of the orthopedic hardware. No osteoblastic or osteolytic lesions. There are changes of chronic avascular necrosis of the right femoral head. When compared to the prior CT, there has been no significant interval change. IMPRESSION: 1. No acute findings.  No findings to account abdominal pain. 2. Chronic findings include chronic intra and extrahepatic bile duct dilation, aortic atherosclerosis, and multiple vertebral fractures. Electronically Signed  By: Lajean Manes M.D.   On: 05/12/2018 16:06   Dg Abd Acute W/chest  Result Date: 04/12/2018 CLINICAL DATA:  Intractable nausea and vomiting for a day EXAM: DG ABDOMEN ACUTE W/ 1V CHEST COMPARISON:  None. FINDINGS: Mild cardiomegaly. Minimal aortic atherosclerosis. No aneurysm. Clear lungs. No free air beneath the diaphragm. Bowel gas pattern is unremarkable without obstructive pattern. Phleboliths are present bilaterally within the pelvis. Kyphoplasties at T11, T12 L2, L3 and L4 with spinal fusion hardware from L1 through L3. Cholecystectomy clips are present. IMPRESSION: Negative abdominal radiographs. No acute cardiopulmonary disease. Electronically Signed   By: Ashley Royalty M.D.   On: 04/12/2018 23:52   Medications Ordered in ED Medications  metoCLOPramide (REGLAN) injection 5 mg (has no administration in time range)  morphine 4 MG/ML injection 4 mg (has no administration in time range)  Lab work unremarkable  Chest x-ray viewed by me  Initial Impression / Assessment and Plan / ED Course  I have reviewed the triage vital signs and the nursing notes.  Pertinent labs & imaging results that were available during my care of the patient were reviewed by me and considered in my medical decision making (see chart for details).     1:25 PM nausea is improved after treatment with venous antiemetics.  She continues to complain of pain .  She had refused intravenous morphine.  She now agrees to taking  morphine. 4:15 PM pain is well controlled.  She continues to feel nauseated.  IV Zofran ordered.  4:55 PM she feels nausea is well controlled.  She is able to drink without vomiting.    Further history.  Patient has had multiple episodes similar to this in the past.  She reports that her pain management physician feels that it is secondary to pain medicine that she takes.  I will prescribe Zofran ODT.  She can take in addition to Phenergan as needed.  She is instructed to return for intractable vomiting or if condition worsens.  Pressure recheck 3 weeks Final Clinical Impressions(s) / ED Diagnoses  Diagnosis #1 vomiting #2 right-sided abdominal pain Final diagnoses:  None  #3 elevated blood pressure  ED Discharge Orders    None       Orlie Dakin, MD 05/12/18 1701    Orlie Dakin, MD 05/12/18 1704

## 2018-05-12 NOTE — ED Notes (Signed)
Pt states increasing nausea.

## 2018-05-12 NOTE — ED Triage Notes (Signed)
Pt c/o nausea and lower abdominal pain that radiates to back, starting last night.

## 2018-05-12 NOTE — Discharge Instructions (Addendum)
Take the medication prescribed today as needed for nausea.  You can take the Phenergan that you have at home as prescribed as well to control your nausea.Make sure that you drink at least six 8 ounce glasses of water  each day in order to stay well-hydrated.  Return if you are unable to hold down fluids without vomiting or if you feel worse for any reason.  Your blood pressure should be rechecked within the next 3 weeks at Dr. Idell Pickles office.  Today's was mildly elevated at 156/86.

## 2018-05-12 NOTE — ED Notes (Signed)
Pt cannot urinate and refuses to be catheterized.  MD notified.

## 2018-05-13 ENCOUNTER — Other Ambulatory Visit: Payer: Self-pay

## 2018-05-13 ENCOUNTER — Encounter (HOSPITAL_COMMUNITY): Payer: Self-pay

## 2018-05-13 ENCOUNTER — Inpatient Hospital Stay (HOSPITAL_COMMUNITY)
Admission: EM | Admit: 2018-05-13 | Discharge: 2018-05-16 | DRG: 641 | Disposition: A | Discharge status: Home / Self Care | Payer: PPO | Attending: Internal Medicine | Admitting: Internal Medicine

## 2018-05-13 DIAGNOSIS — K219 Gastro-esophageal reflux disease without esophagitis: Secondary | ICD-10-CM | POA: Diagnosis present

## 2018-05-13 DIAGNOSIS — Z881 Allergy status to other antibiotic agents status: Secondary | ICD-10-CM

## 2018-05-13 DIAGNOSIS — N182 Chronic kidney disease, stage 2 (mild): Secondary | ICD-10-CM | POA: Diagnosis present

## 2018-05-13 DIAGNOSIS — D472 Monoclonal gammopathy: Secondary | ICD-10-CM | POA: Diagnosis present

## 2018-05-13 DIAGNOSIS — E782 Mixed hyperlipidemia: Secondary | ICD-10-CM | POA: Diagnosis present

## 2018-05-13 DIAGNOSIS — Z9842 Cataract extraction status, left eye: Secondary | ICD-10-CM

## 2018-05-13 DIAGNOSIS — Z9049 Acquired absence of other specified parts of digestive tract: Secondary | ICD-10-CM

## 2018-05-13 DIAGNOSIS — M810 Age-related osteoporosis without current pathological fracture: Secondary | ICD-10-CM | POA: Diagnosis present

## 2018-05-13 DIAGNOSIS — I1310 Hypertensive heart and chronic kidney disease without heart failure, with stage 1 through stage 4 chronic kidney disease, or unspecified chronic kidney disease: Secondary | ICD-10-CM | POA: Diagnosis present

## 2018-05-13 DIAGNOSIS — Z79899 Other long term (current) drug therapy: Secondary | ICD-10-CM

## 2018-05-13 DIAGNOSIS — K5900 Constipation, unspecified: Secondary | ICD-10-CM | POA: Diagnosis present

## 2018-05-13 DIAGNOSIS — J4520 Mild intermittent asthma, uncomplicated: Secondary | ICD-10-CM | POA: Diagnosis present

## 2018-05-13 DIAGNOSIS — G894 Chronic pain syndrome: Secondary | ICD-10-CM | POA: Diagnosis present

## 2018-05-13 DIAGNOSIS — I10 Essential (primary) hypertension: Secondary | ICD-10-CM | POA: Diagnosis present

## 2018-05-13 DIAGNOSIS — Z888 Allergy status to other drugs, medicaments and biological substances status: Secondary | ICD-10-CM

## 2018-05-13 DIAGNOSIS — N183 Chronic kidney disease, stage 3 (moderate): Secondary | ICD-10-CM | POA: Diagnosis present

## 2018-05-13 DIAGNOSIS — I2510 Atherosclerotic heart disease of native coronary artery without angina pectoris: Secondary | ICD-10-CM | POA: Diagnosis present

## 2018-05-13 DIAGNOSIS — Z955 Presence of coronary angioplasty implant and graft: Secondary | ICD-10-CM

## 2018-05-13 DIAGNOSIS — R112 Nausea with vomiting, unspecified: Secondary | ICD-10-CM

## 2018-05-13 DIAGNOSIS — Z800 Family history of malignant neoplasm of digestive organs: Secondary | ICD-10-CM

## 2018-05-13 DIAGNOSIS — J42 Unspecified chronic bronchitis: Secondary | ICD-10-CM | POA: Diagnosis present

## 2018-05-13 DIAGNOSIS — Z9841 Cataract extraction status, right eye: Secondary | ICD-10-CM

## 2018-05-13 DIAGNOSIS — Z8249 Family history of ischemic heart disease and other diseases of the circulatory system: Secondary | ICD-10-CM

## 2018-05-13 DIAGNOSIS — Z8673 Personal history of transient ischemic attack (TIA), and cerebral infarction without residual deficits: Secondary | ICD-10-CM

## 2018-05-13 DIAGNOSIS — Z7983 Long term (current) use of bisphosphonates: Secondary | ICD-10-CM

## 2018-05-13 DIAGNOSIS — I2583 Coronary atherosclerosis due to lipid rich plaque: Secondary | ICD-10-CM | POA: Diagnosis present

## 2018-05-13 DIAGNOSIS — K529 Noninfective gastroenteritis and colitis, unspecified: Secondary | ICD-10-CM | POA: Diagnosis present

## 2018-05-13 DIAGNOSIS — E876 Hypokalemia: Secondary | ICD-10-CM | POA: Diagnosis present

## 2018-05-13 DIAGNOSIS — F419 Anxiety disorder, unspecified: Secondary | ICD-10-CM | POA: Diagnosis present

## 2018-05-13 DIAGNOSIS — Z981 Arthrodesis status: Secondary | ICD-10-CM

## 2018-05-13 DIAGNOSIS — Z90710 Acquired absence of both cervix and uterus: Secondary | ICD-10-CM

## 2018-05-13 DIAGNOSIS — Z8719 Personal history of other diseases of the digestive system: Secondary | ICD-10-CM

## 2018-05-13 DIAGNOSIS — Z961 Presence of intraocular lens: Secondary | ICD-10-CM | POA: Diagnosis present

## 2018-05-13 DIAGNOSIS — E860 Dehydration: Principal | ICD-10-CM | POA: Diagnosis present

## 2018-05-13 DIAGNOSIS — Z7982 Long term (current) use of aspirin: Secondary | ICD-10-CM

## 2018-05-13 LAB — COMPREHENSIVE METABOLIC PANEL
ALT: 22 U/L (ref 0–44)
AST: 27 U/L (ref 15–41)
Albumin: 4.2 g/dL (ref 3.5–5.0)
Alkaline Phosphatase: 48 U/L (ref 38–126)
Anion gap: 12 (ref 5–15)
BUN: 12 mg/dL (ref 8–23)
CO2: 20 mmol/L — ABNORMAL LOW (ref 22–32)
CREATININE: 0.88 mg/dL (ref 0.44–1.00)
Calcium: 9.3 mg/dL (ref 8.9–10.3)
Chloride: 103 mmol/L (ref 98–111)
GFR calc Af Amer: >60 mL/min (ref >60)
GFR calc non Af Amer: >60 mL/min (ref >60)
Glucose, Bld: 124 mg/dL — ABNORMAL HIGH (ref 70–99)
Potassium: 3.8 mmol/L (ref 3.5–5.1)
Sodium: 135 mmol/L (ref 135–145)
Total Bilirubin: 1 mg/dL (ref 0.3–1.2)
Total Protein: 7.6 g/dL (ref 6.5–8.1)

## 2018-05-13 LAB — CBC WITH DIFFERENTIAL/PLATELET
Abs Immature Granulocytes: 0.02 10*3/uL (ref 0.00–0.07)
BASOS ABS: 0 10*3/uL (ref 0.0–0.1)
Basophils Relative: 0 %
Eosinophils Absolute: 0 10*3/uL (ref 0.0–0.5)
Eosinophils Relative: 0 %
HCT: 48.2 % — ABNORMAL HIGH (ref 36.0–46.0)
Hemoglobin: 15.6 g/dL — ABNORMAL HIGH (ref 12.0–15.0)
Immature Granulocytes: 0 %
Lymphocytes Relative: 18 %
Lymphs Abs: 1.4 10*3/uL (ref 0.7–4.0)
MCH: 31 pg (ref 26.0–34.0)
MCHC: 32.4 g/dL (ref 30.0–36.0)
MCV: 95.8 fL (ref 80.0–100.0)
Monocytes Absolute: 0.5 10*3/uL (ref 0.1–1.0)
Monocytes Relative: 7 %
NEUTROS ABS: 5.7 10*3/uL (ref 1.7–7.7)
Neutrophils Relative %: 75 %
PLATELETS: 313 10*3/uL (ref 150–400)
RBC: 5.03 MIL/uL (ref 3.87–5.11)
RDW: 13.2 % (ref 11.5–15.5)
WBC: 7.6 10*3/uL (ref 4.0–10.5)
nRBC: 0 % (ref 0.0–0.2)

## 2018-05-13 LAB — URINALYSIS, ROUTINE W REFLEX MICROSCOPIC
Bacteria, UA: NONE SEEN
Bilirubin Urine: NEGATIVE
GLUCOSE, UA: NEGATIVE mg/dL
HGB URINE DIPSTICK: NEGATIVE
Ketones, ur: 20 mg/dL — AB
Leukocytes, UA: NEGATIVE
Nitrite: NEGATIVE
PH: 7 (ref 5.0–8.0)
Protein, ur: 30 mg/dL — AB
Specific Gravity, Urine: 1.014 (ref 1.005–1.030)

## 2018-05-13 LAB — LIPASE, BLOOD: Lipase: 21 U/L (ref 11–51)

## 2018-05-13 MED ORDER — PROMETHAZINE HCL 25 MG/ML IJ SOLN
12.5000 mg | Freq: Once | INTRAMUSCULAR | Status: AC
  Administered 2018-05-13: 12.5 mg via INTRAVENOUS
  Filled 2018-05-13: qty 1

## 2018-05-13 MED ORDER — ALBUTEROL SULFATE HFA 108 (90 BASE) MCG/ACT IN AERS: 2.0000 | INHALATION_SPRAY | Freq: Four times a day (QID) | RESPIRATORY_TRACT | Status: DC | PRN

## 2018-05-13 MED ORDER — DICYCLOMINE HCL 10 MG PO CAPS: 10.0000 mg | ORAL_CAPSULE | Freq: Three times a day (TID) | ORAL | Status: DC | PRN

## 2018-05-13 MED ORDER — HYDRALAZINE HCL 20 MG/ML IJ SOLN: 5.0000 mg | INTRAMUSCULAR | Status: DC | PRN

## 2018-05-13 MED ORDER — LOSARTAN POTASSIUM 50 MG PO TABS
50.0000 mg | ORAL_TABLET | Freq: Every day | ORAL | Status: DC
  Administered 2018-05-14: 50 mg via ORAL
  Filled 2018-05-13: qty 1

## 2018-05-13 MED ORDER — PANTOPRAZOLE SODIUM 40 MG PO TBEC
40.0000 mg | DELAYED_RELEASE_TABLET | Freq: Every day | ORAL | Status: DC
  Administered 2018-05-14 – 2018-05-16 (×3): 40 mg via ORAL
  Filled 2018-05-13 (×3): qty 1

## 2018-05-13 MED ORDER — PRAVASTATIN SODIUM 40 MG PO TABS
40.0000 mg | ORAL_TABLET | Freq: Every day | ORAL | Status: DC
  Administered 2018-05-13 – 2018-05-15 (×3): 40 mg via ORAL
  Filled 2018-05-13 (×3): qty 1

## 2018-05-13 MED ORDER — LOPERAMIDE HCL 2 MG PO CAPS: 2.0000 mg | ORAL_CAPSULE | ORAL | Status: DC | PRN

## 2018-05-13 MED ORDER — AMLODIPINE BESYLATE 5 MG PO TABS
10.0000 mg | ORAL_TABLET | Freq: Every day | ORAL | Status: DC
  Administered 2018-05-13: 10 mg via ORAL
  Filled 2018-05-13: qty 2

## 2018-05-13 MED ORDER — METHOCARBAMOL 500 MG PO TABS
500.0000 mg | ORAL_TABLET | Freq: Three times a day (TID) | ORAL | Status: DC | PRN
  Administered 2018-05-16: 500 mg via ORAL
  Filled 2018-05-13: qty 1

## 2018-05-13 MED ORDER — SODIUM CHLORIDE 0.9 % IV BOLUS
1000.0000 mL | Freq: Once | INTRAVENOUS | Status: AC
  Administered 2018-05-13: 1000 mL via INTRAVENOUS

## 2018-05-13 MED ORDER — MAGNESIUM 400 MG PO TABS: 400.0000 mg | ORAL_TABLET | Freq: Every day | ORAL | Status: DC

## 2018-05-13 MED ORDER — GABAPENTIN 300 MG PO CAPS
300.0000 mg | ORAL_CAPSULE | Freq: Two times a day (BID) | ORAL | Status: DC
  Administered 2018-05-13 – 2018-05-16 (×6): 300 mg via ORAL
  Filled 2018-05-13 (×6): qty 1

## 2018-05-13 MED ORDER — ALPRAZOLAM 0.25 MG PO TABS
0.2500 mg | ORAL_TABLET | Freq: Every evening | ORAL | Status: DC | PRN
  Administered 2018-05-13 – 2018-05-15 (×3): 0.25 mg via ORAL
  Filled 2018-05-13 (×3): qty 1

## 2018-05-13 MED ORDER — CLONIDINE HCL 0.1 MG PO TABS: 0.1000 mg | ORAL_TABLET | ORAL | Status: DC

## 2018-05-13 MED ORDER — ASPIRIN EC 81 MG PO TBEC
81.0000 mg | DELAYED_RELEASE_TABLET | Freq: Every day | ORAL | Status: DC
  Administered 2018-05-13 – 2018-05-15 (×3): 81 mg via ORAL
  Filled 2018-05-13 (×3): qty 1

## 2018-05-13 MED ORDER — ENOXAPARIN SODIUM 40 MG/0.4ML ~~LOC~~ SOLN
40.0000 mg | SUBCUTANEOUS | Status: DC
  Administered 2018-05-13 – 2018-05-15 (×3): 40 mg via SUBCUTANEOUS
  Filled 2018-05-13 (×3): qty 0.4

## 2018-05-13 MED ORDER — ONDANSETRON HCL 4 MG PO TABS: 4.0000 mg | ORAL_TABLET | Freq: Four times a day (QID) | ORAL | Status: DC | PRN

## 2018-05-13 MED ORDER — CLONIDINE HCL 0.1 MG PO TABS
0.1000 mg | ORAL_TABLET | Freq: Four times a day (QID) | ORAL | Status: DC
  Filled 2018-05-13: qty 1

## 2018-05-13 MED ORDER — MORPHINE SULFATE (PF) 4 MG/ML IV SOLN
4.0000 mg | Freq: Once | INTRAVENOUS | Status: AC
  Administered 2018-05-13: 4 mg via INTRAVENOUS
  Filled 2018-05-13: qty 1

## 2018-05-13 MED ORDER — TAMSULOSIN HCL 0.4 MG PO CAPS
0.4000 mg | ORAL_CAPSULE | Freq: Every day | ORAL | Status: DC
  Administered 2018-05-14 – 2018-05-16 (×3): 0.4 mg via ORAL
  Filled 2018-05-13 (×4): qty 1

## 2018-05-13 MED ORDER — ONDANSETRON 4 MG PO TBDP
4.0000 mg | ORAL_TABLET | Freq: Four times a day (QID) | ORAL | Status: DC | PRN
  Filled 2018-05-13: qty 1

## 2018-05-13 MED ORDER — ONDANSETRON HCL 4 MG/2ML IJ SOLN
4.0000 mg | Freq: Once | INTRAMUSCULAR | Status: AC
  Administered 2018-05-13: 4 mg via INTRAVENOUS
  Filled 2018-05-13: qty 2

## 2018-05-13 MED ORDER — ONDANSETRON HCL 4 MG/2ML IJ SOLN: 4.0000 mg | Freq: Four times a day (QID) | INTRAMUSCULAR | Status: DC | PRN

## 2018-05-13 MED ORDER — ACETAMINOPHEN 650 MG RE SUPP: 650.0000 mg | Freq: Four times a day (QID) | RECTAL | Status: DC | PRN

## 2018-05-13 MED ORDER — MAGNESIUM OXIDE 400 (241.3 MG) MG PO TABS
400.0000 mg | ORAL_TABLET | Freq: Every day | ORAL | Status: DC
  Administered 2018-05-14 – 2018-05-16 (×3): 400 mg via ORAL
  Filled 2018-05-13 (×4): qty 1

## 2018-05-13 MED ORDER — IPRATROPIUM-ALBUTEROL 0.5-2.5 (3) MG/3ML IN SOLN: 3.0000 mL | Freq: Four times a day (QID) | RESPIRATORY_TRACT | Status: DC | PRN

## 2018-05-13 MED ORDER — NITROGLYCERIN 0.4 MG SL SUBL: 0.4000 mg | SUBLINGUAL_TABLET | SUBLINGUAL | Status: DC | PRN

## 2018-05-13 MED ORDER — POLYETHYLENE GLYCOL 3350 17 GM/SCOOP PO POWD: 17.0000 g | Freq: Every day | ORAL | Status: DC

## 2018-05-13 MED ORDER — POLYETHYLENE GLYCOL 3350 17 G PO PACK
17.0000 g | PACK | Freq: Every day | ORAL | Status: DC
  Administered 2018-05-16: 17 g via ORAL
  Filled 2018-05-13: qty 1

## 2018-05-13 MED ORDER — CORAL CALCIUM 1000 (390 CA) MG PO TABS: 1000.0000 mg | ORAL_TABLET | Freq: Two times a day (BID) | ORAL | Status: DC

## 2018-05-13 MED ORDER — NAPROXEN 250 MG PO TABS
500.0000 mg | ORAL_TABLET | Freq: Two times a day (BID) | ORAL | Status: DC | PRN
  Filled 2018-05-13: qty 2

## 2018-05-13 MED ORDER — DICYCLOMINE HCL 20 MG PO TABS: 20.0000 mg | ORAL_TABLET | Freq: Four times a day (QID) | ORAL | Status: DC | PRN

## 2018-05-13 MED ORDER — SUCRALFATE 1 G PO TABS
1.0000 g | ORAL_TABLET | Freq: Three times a day (TID) | ORAL | Status: DC
  Administered 2018-05-14 – 2018-05-16 (×5): 1 g via ORAL
  Filled 2018-05-13 (×5): qty 1

## 2018-05-13 MED ORDER — METOPROLOL TARTRATE 25 MG PO TABS
25.0000 mg | ORAL_TABLET | Freq: Every day | ORAL | Status: DC
  Administered 2018-05-13 – 2018-05-15 (×2): 25 mg via ORAL
  Filled 2018-05-13 (×3): qty 1

## 2018-05-13 MED ORDER — CLONIDINE HCL 0.1 MG PO TABS: 0.1000 mg | ORAL_TABLET | Freq: Every day | ORAL | Status: DC

## 2018-05-13 MED ORDER — SODIUM CHLORIDE 0.9 % IV SOLN
INTRAVENOUS | Status: DC
  Administered 2018-05-13 – 2018-05-14 (×2): via INTRAVENOUS

## 2018-05-13 MED ORDER — ACETAMINOPHEN 325 MG PO TABS: 650.0000 mg | ORAL_TABLET | Freq: Four times a day (QID) | ORAL | Status: DC | PRN

## 2018-05-13 MED ORDER — SENNOSIDES-DOCUSATE SODIUM 8.6-50 MG PO TABS
1.0000 | ORAL_TABLET | Freq: Two times a day (BID) | ORAL | Status: DC
  Administered 2018-05-15 – 2018-05-16 (×2): 1 via ORAL
  Filled 2018-05-13 (×4): qty 1

## 2018-05-13 MED ORDER — SODIUM CHLORIDE 0.9 % IV SOLN
INTRAVENOUS | Status: DC
  Administered 2018-05-13: 19:00:00 via INTRAVENOUS

## 2018-05-13 MED ORDER — HYDROXYZINE HCL 25 MG PO TABS: 25.0000 mg | ORAL_TABLET | Freq: Four times a day (QID) | ORAL | Status: DC | PRN

## 2018-05-13 NOTE — H&P (Signed)
History and Physical    [name] [name] MVH:[id number] DOB: 21-Nov-1935 DOA: 05/13/2018  PCP: [name], MD  Patient coming from: Home.  Chief Complaint: Nausea vomiting.  HPI: [name] [name] is a 83 y.o. female with history of hiatal hernia status post fundoplication, hypertension, hyperlipidemia, asthma, chronic pain syndrome who was admitted last month in December 1 week with nausea vomiting presents with similar complaints with persistent vomiting over the last 4 days.  Patient had come to the ER 2 days ago on May 12, 2017 and at that time CAT scan of the abdomen and pelvis was unremarkable.  Patient was discharged home after patient got better symptomatically.  After going back home patient started having again persistent nausea and vomiting denies any diarrhea.  Denies any blood in the vomitus.  Has some lower abdominal discomfort from vomiting.  Has no dysuria or discharges.  Denies any fever chills chest pain shortness of breath.  ED Course: In the ER patient remained nauseous and has not had any good intake for last 4 days.  Patient states over the last 2 days patient deliberately stopped taking her oxycodone stating that this may be causing her problem.  In the ER patient did receive 1 dose of morphine but patient states she does not want to continue any movement narcotics.  Abdominal exam appears benign.  Labs are largely unremarkable except for mildly elevated anion gap.  UA is unremarkable but shows some ketones.  Patient was placed on fluids and admitted for intractable nausea vomiting.  Review of Systems: As per HPI, rest all negative.   Past Medical History:  Diagnosis Date  . Anxiety    takes Xanax daily as needed  . Asthma    Albuterol daily as needed  . Blood transfusion without reported diagnosis   . CAD (coronary artery disease)    LHC 2003 with 40% pLAD, 30% mLAD, 100% D1 (moderate vessel), 100%  D2 (small vessel), 95% mid small OM2.  Pt had PTCA to D1;  D2 and  OM2 small vessels and tx medically;  Last Myoview (2012): anterior infarct seen with scar, no ischemia. EF normal. Med Rx // Myoview (12/14):  Low risk; ant scar w/ peri-infarct ischemia; EF 55% with ant AK // Myoview 5/16: EF 55-65, ant, apical scar, no ischemia   . Cataract   . Chronic back pain    HNP, DDD, spinal stenosis, vertebral compression fractures.   . Chronic nausea    takes Zofran daily as needed.  normal gastric emptying study 03/2013  . CKD (chronic kidney disease), stage III ([place]) 09/27/2017  . Constipation    felt to be functional. takes Senokot and Miralax daily.  treated with Linzess in past.   . DIVERTICULOSIS, COLON 08/28/2008   Qualifier: Diagnosis of  By: [name], [name], [name]    . Elevated LFTs 2015   probably med induced DILI, from Augmentin vs Pravastatin  . GERD (gastroesophageal reflux disease)    hx of esophageal stricture   . Hiatal hernia    Paraesophageal hernias. s/p fundoplications in 8413 and 04/2013  . History of bronchitis several of yrs ago  . History of colon polyps 03/2009, 03/2011   adenomatous colon polyps, no high grade dysplasia.   [name] History of vertigo    no meds  . Hyperlipidemia    takes Pravastatin daily  . Hypertension    takes Amlodipine,Metoprolol,and Losartan daily  . Nausea 03/2016  . Osteoporosis   . PONV (postoperative nausea and vomiting)  yrs ago  . Slow urinary stream    takes Rapaflo daily  . TIA (transient ischemic attack) 1995   no residual effects noted    Past Surgical History:  Procedure Laterality Date  . APPENDECTOMY     patient unsure of date  . BACK SURGERY    . BLADDER REPAIR    . CHOLECYSTECTOMY     Patient unsure of date.  . CORONARY ANGIOPLASTY  11/16/2001   1 stent  . ESOPHAGEAL MANOMETRY N/A 03/25/2013   Procedure: ESOPHAGEAL MANOMETRY (EM);  Surgeon: Inda Castle, MD;  Location: [facility] ENDOSCOPY;  Service: Endoscopy;  Laterality: N/A;  . ESOPHAGOGASTRODUODENOSCOPY N/A 03/15/2013   Procedure:  ESOPHAGOGASTRODUODENOSCOPY (EGD);  Surgeon: Jerene Bears, MD;  Location: [facility] ENDOSCOPY;  Service: Endoscopy;  Laterality: N/A;  . ESOPHAGOGASTRODUODENOSCOPY N/A 12/25/2015   Procedure: ESOPHAGOGASTRODUODENOSCOPY (EGD);  Surgeon: Doran Stabler, MD;  Location: [facility] ENDOSCOPY;  Service: Endoscopy;  Laterality: N/A;  . EYE SURGERY  11/2000   bilateral cataracts with lens implant  . FIXATION KYPHOPLASTY LUMBAR SPINE  X 2   "L3-4"  . HEMORRHOID SURGERY    . HIATAL HERNIA REPAIR  06/03/2011   Procedure: LAPAROSCOPIC REPAIR OF HIATAL HERNIA;  Surgeon: Adin Hector, MD;  Location: [facility] ORS;  Service: General;  Laterality: N/A;  . HIATAL HERNIA REPAIR N/A 04/24/2013   Procedure: LAPAROSCOPIC  REPAIR RECURRENT PARASOPHAGEAL HIATAL HERNIA WITH FUNDOPLICATION;  Surgeon: Adin Hector, MD;  Location: [facility] ORS;  Service: General;  Laterality: N/A;  . INSERTION OF MESH N/A 04/24/2013   Procedure: INSERTION OF MESH;  Surgeon: Adin Hector, MD;  Location: [facility] ORS;  Service: General;  Laterality: N/A;  . IR KYPHO THORACIC WITH BONE BIOPSY  09/29/2017  . IR KYPHO THORACIC WITH BONE BIOPSY  11/03/2017  . LAPAROSCOPIC LYSIS OF ADHESIONS N/A 04/24/2013   Procedure: LAPAROSCOPIC LYSIS OF ADHESIONS;  Surgeon: Adin Hector, MD;  Location: [facility] OR[facility];  Service: General;  Laterality: N/A;  . LAPAROSCOPIC NISSEN FUNDOPLICATION  1/91/4782   Procedure: LAPAROSCOPIC NISSEN FUNDOPLICATION;  Surgeon: Adin Hector, MD;  Location: [facility] OR[facility];  Service: General;  Laterality: N/A;  . LUMBAR FUSION  12/07/2015   Right L2-3 facetectomy, posterolateral fusion L2-3 with pedicle screws, rods, local bone graft, Vivigen cancellous chips. Fusion extended to the L1 level with pedicle screws and rods  . LUMBAR LAMINECTOMY/DECOMPRESSION MICRODISCECTOMY Right 06/26/2012   Procedure: LUMBAR LAMINECTOMY/DECOMPRESSION MICRODISCECTOMY;  Surgeon: Jessy Oto, MD;  Location: [place];  Service: Orthopedics;  Laterality: Right;  RIGHT L4-5  MICRODISCECTOMY  . LUMBAR LAMINECTOMY/DECOMPRESSION MICRODISCECTOMY N/A 05/29/2015   Procedure: RIGHT L2-3 MICRODISCECTOMY;  Surgeon: Jessy Oto, MD;  Location: [place];  Service: Orthopedics;  Laterality: N/A;  . TOTAL ABDOMINAL HYSTERECTOMY  1975   partial  . UPPER GASTROINTESTINAL ENDOSCOPY       reports that she has never smoked. She has never used smokeless tobacco. She reports that she does not drink alcohol or use drugs.  Allergies  Allergen Reactions  . Ace Inhibitors Cough    Per Dr Melvyn Novas pulmonology 2013  . Aspartame Diarrhea and Nausea And Vomiting  . Atorvastatin Other (See Comments)    Muscle aches  . Biaxin [Clarithromycin] Other (See Comments)    Unknown reaction : PATIENT CAN TOLERATE Z-PAK.  Is written on patient's paper chart.  [name] [Roflumilast] Nausea And Vomiting  . Erythromycin Other (See Comments)    Unknown reaction: PATIENT CAN TOLERATE Z-PAK.  It is written on patient's paper  chart.  . Levofloxacin Nausea And Vomiting    Family History  Problem Relation Age of Onset  . Colon cancer Mother 59  . Heart attack Father 22       heart attack  . Brain cancer Brother        tumor   . Heart disease Brother   . Heart disease Brother   . Kidney disease Daughter   . Esophageal cancer Neg Hx   . Stomach cancer Neg Hx     Prior to Admission medications   Medication Sig Start Date End Date Taking? Authorizing Provider  acetaminophen (TYLENOL) 500 MG tablet Take 1 tablet (500 mg total) by mouth every 6 (six) hours as needed. Patient taking differently: Take 500-1,000 mg by mouth every 6 (six) hours as needed (pain).  03/18/18  Yes Bast, Traci A, NP  albuterol (PROAIR HFA) 108 (90 Base) MCG/ACT inhaler Inhale 2 puffs into the lungs every 6 (six) hours as needed for wheezing or shortness of breath. 09/22/16  Yes Vicie Mutters, PA-C  alendronate (FOSAMAX) 70 MG tablet Take with a full glass of water on an empty stomach. Patient taking differently: Take 70 mg  by mouth every Sunday. Take with a full glass of water on an empty stomach. 02/16/18  Yes Deveshwar, Abel Presto, MD  ALPRAZolam Duanne Moron) 0.5 MG tablet Take 1/2 tablet 1- 2 x /day ONLY if needed for Anxiety Attack &  limit to 5 days /week to avoid addiction (Last till Mar 30) Patient taking differently: Take 0.25 mg by mouth at bedtime as needed for sleep.  05/08/18  Yes [name], MD  amLODipine (NORVASC) 10 MG tablet TAKE 1 TABLET BY MOUTH AT BEDTIME Patient taking differently: Take 10 mg by mouth at bedtime.  09/08/17  Yes [name], MD  aspirin EC 81 MG tablet Take 81 mg by mouth at bedtime.    Yes [provider]  Cholecalciferol (VITAMIN D3) 5000 units CAPS Take 5,000 Units by mouth 2 (two) times daily.    Yes [provider]  Coral Calcium 1000 (390 Ca) MG TABS Take 1,000 mg by mouth 2 (two) times daily.   Yes [provider]  diclofenac sodium (VOLTAREN) 1 % GEL Apply 2 g topically 4 (four) times daily. Patient taking differently: Apply 2 g topically 4 (four) times daily as needed (pain).  03/18/18  Yes Bast, Traci A, NP  dicyclomine (BENTYL) 10 MG capsule TAKE 1 CAPSULE BY MOUTH EVERY 8 HOURS AS NEEDED FOR SPASM Patient taking differently: Take 10 mg by mouth 3 (three) times daily as needed for spasms.  02/05/18  Yes Armbruster, Carlota Raspberry, MD  gabapentin (NEURONTIN) 300 MG capsule TAKE 1 CAPSULE BY MOUTH THREE TIMES DAILY FOR  CHRONIC  PAIN. Patient taking differently: Take 300 mg by mouth 2 (two) times daily.  10/06/17  Yes [name], MD  ipratropium-albuterol (DUONEB) 0.5-2.5 (3) MG/3ML SOLN Take 3 mLs by nebulization every 6 (six) hours as needed (shortness of breath/wheezing).    Yes [provider]  ketoconazole (NIZORAL) 2 % cream  APPLY ONE APPLICATION TOPICALLY TWO TIMES DAILY Patient taking differently: Apply 1 application topically 2 (two) times daily as needed for irritation.  04/02/18  Yes [name], MD  losartan (COZAAR) 50  MG tablet Take 1 tablet (50 mg total) by mouth daily. 04/15/18  Yes Black, Lezlie Octave, NP  Magnesium 400 MG TABS Take 400 mg by mouth daily.   Yes [provider]  meclizine (ANTIVERT) 25 MG tablet  TAKE ONE-HALF TO ONE TABLET BY MOUTH UP TO THREE TIMES DAILY FOR MOTION SICKNESS/DIZZINESS Patient taking differently: Take 25 mg by mouth 3 (three) times daily as needed for dizziness (motion sickness).  04/20/18  Yes Vicie Mutters, PA-C  metoprolol tartrate (LOPRESSOR) 50 MG tablet Take 25 mg by mouth at bedtime.    Yes [provider]  mometasone-formoterol (DULERA) 100-5 MCG/ACT AERO Inhale 2 puffs into the lungs every 12 (twelve) hours. Patient taking differently: Inhale 2 puffs into the lungs every 12 (twelve) hours as needed for wheezing or shortness of breath.  09/08/17  Yes [name], MD  nitroGLYCERIN (NITROSTAT) 0.4 MG SL tablet Place 1 tablet (0.4 mg total) under the tongue every 5 (five) minutes as needed for chest pain. 09/04/14  Yes Larey Dresser, MD  omeprazole (PRILOSEC) 20 MG capsule Take 1 capsule daily for heartburn, Reflux & Nausea Patient taking differently: Take 20 mg by mouth daily. For heartburn, reflux and/or nausea 04/17/18  Yes [name], MD  ondansetron (ZOFRAN ODT) 8 MG disintegrating tablet 8mg  ODT q8 hours prn nausea Patient taking differently: Take 8 mg by mouth every 8 (eight) hours as needed for nausea or vomiting.  05/12/18  Yes Orlie Dakin, MD  polyethylene glycol powder (GLYCOLAX/MIRALAX) powder Take 17 g by mouth 2 (two) times daily. Patient taking differently: Take 17 g by mouth daily.  05/16/17  Yes Armbruster, Carlota Raspberry, MD  pravastatin (PRAVACHOL) 40 MG tablet TAKE 1 TABLET BY MOUTH DAILY Patient taking differently: Take 40 mg by mouth at bedtime.  03/14/18  Yes [name], MD  promethazine (PHENERGAN) 25 MG tablet Take 1 tablet (25 mg total) by mouth every 6 (six) hours as needed for nausea or vomiting. 11/11/17  Yes Danford,  Suann Larry, MD  senna-docusate (SENOKOT-S) 8.6-50 MG tablet Take 2 tablets by mouth 2 (two) times daily. Patient taking differently: Take 1 tablet by mouth 2 (two) times daily.  12/26/15  Yes Emokpae, Courage, MD  sucralfate (CARAFATE) 1 g tablet Take 1 tablet 2 to 4 x /day before meals for Heartburn Patient taking differently: Take 1 g by mouth 3 (three) times daily before meals.  04/17/18  Yes [name], MD  tamsulosin (FLOMAX) 0.4 MG CAPS capsule Take 0.4 mg by mouth daily. 03/07/18  Yes [provider]  lubiprostone (AMITIZA) 8 MCG capsule Take 1 capsule (8 mcg total) by mouth 2 (two) times daily with a meal. Patient not taking: Reported on 04/12/2018 12/21/17   Zehr, Laban Emperor, PA-C  oxyCODONE-acetaminophen (PERCOCET/ROXICET) 5-325 MG tablet Take 1 tablet by mouth every 4 (four) hours as needed for severe pain. Patient not taking: Reported on 05/13/2018 03/10/17   Tegeler, Gwenyth Allegra, MD    Physical Exam: Vitals:   05/13/18 1900 05/13/18 1945 05/13/18 2030 05/13/18 2124  BP: (!) 162/76 (!) 168/77 (!) 158/88 (!) 175/89  Pulse: 89 84 87 88  Resp:    19  Temp:    98.4 F (36.9 C)  TempSrc:    Oral  SpO2: 97% 97% 95% 96%  Weight:      Height:          Constitutional: Moderately built and nourished. Vitals:   05/13/18 1900 05/13/18 1945 05/13/18 2030 05/13/18 2124  BP: (!) 162/76 (!) 168/77 (!) 158/88 (!) 175/89  Pulse: 89 84 87 88  Resp:    19  Temp:    98.4 F (36.9 C)  TempSrc:    Oral  SpO2: 97% 97% 95% 96%  Weight:      Height:       Eyes: Anicteric no pallor. ENMT: No discharge from the ears eyes nose or mouth. Neck: No mass felt.  No neck rigidity no JVD appreciated. Respiratory: No rhonchi or crepitations. Cardiovascular: S1-S2 heard. Abdomen: Soft nontender bowel sounds present. Musculoskeletal: No edema.  No joint effusion. Skin: No rash. Neurologic: Alert awake oriented to time place and person.  Moves all extremities. Psychiatric: Appears  normal.  Normal affect.   Labs on Admission: I have personally reviewed following labs and imaging studies  CBC: Recent Labs  Lab 05/12/18 1025 05/13/18 1806  WBC 8.6 7.6  NEUTROABS 7.0 5.7  HGB 14.9 15.6*  HCT 45.2 48.2*  MCV 95.0 95.8  PLT 315 657   Basic Metabolic Panel: Recent Labs  Lab 05/12/18 1025 05/13/18 1806  NA 137 135  K 3.8 3.8  CL 106 103  CO2 21* 20*  GLUCOSE 151* 124*  BUN 9 12  CREATININE 0.76 0.88  CALCIUM 9.3 9.3   GFR: Estimated Creatinine Clearance: 48.9 mL/min (by C-G formula based on SCr of 0.88 mg/dL). Liver Function Tests: Recent Labs  Lab 05/12/18 1025 05/13/18 1806  AST 23 27  ALT 19 22  ALKPHOS 52 48  BILITOT 0.7 1.0  PROT 7.5 7.6  ALBUMIN 4.1 4.2   Recent Labs  Lab 05/12/18 1025 05/13/18 1806  LIPASE 21 21   No results for input(s): AMMONIA in the last 168 hours. Coagulation Profile: No results for input(s): INR, PROTIME in the last 168 hours. Cardiac Enzymes: No results for input(s): CKTOTAL, CKMB, CKMBINDEX, TROPONINI in the last 168 hours. BNP (last 3 results) No results for input(s): PROBNP in the last 8760 hours. HbA1C: No results for input(s): HGBA1C in the last 72 hours. CBG: No results for input(s): GLUCAP in the last 168 hours. Lipid Profile: No results for input(s): CHOL, HDL, LDLCALC, TRIG, CHOLHDL, LDLDIRECT in the last 72 hours. Thyroid Function Tests: No results for input(s): TSH, T4TOTAL, FREET4, T3FREE, THYROIDAB in the last 72 hours. Anemia Panel: No results for input(s): VITAMINB12, FOLATE, FERRITIN, TIBC, IRON, RETICCTPCT in the last 72 hours. Urine analysis:    Component Value Date/Time   COLORURINE YELLOW 05/13/2018 1915   APPEARANCEUR CLEAR 05/13/2018 1915   LABSPEC 1.014 05/13/2018 1915   PHURINE 7.0 05/13/2018 1915   GLUCOSEU NEGATIVE 05/13/2018 1915   HGBUR NEGATIVE 05/13/2018 1915   BILIRUBINUR NEGATIVE 05/13/2018 1915   KETONESUR 20 (A) 05/13/2018 1915   PROTEINUR 30 (A) 05/13/2018  1915   UROBILINOGEN 0.2 09/19/2014 2235   NITRITE NEGATIVE 05/13/2018 1915   LEUKOCYTESUR NEGATIVE 05/13/2018 1915   Sepsis Labs: @LABRCNTIP (procalcitonin:4,lacticidven:4) )No results found for this or any previous visit (from the past 240 hour(s)).   Radiological Exams on Admission: Dg Chest 2 View  Result Date: 05/12/2018 CLINICAL DATA:  Vomiting.  Headache.  Symptoms since last night. EXAM: CHEST - 2 VIEW COMPARISON:  04/12/2018 FINDINGS: The heart is mildly enlarged. Normal vascularity. Lungs are under aerated with linear atelectasis at the right lung base. No pneumothorax or pleural effusion. Status post vertebral augmentation and spinal instrumentation in the lower thoracic spine and lumbar spine. IMPRESSION: Cardiomegaly without decompensation. Electronically Signed   By: Marybelle Killings M.D.   On: 05/12/2018 11:00   Ct Abdomen Pelvis W Contrast  Result Date: 05/12/2018 CLINICAL DATA:  Pt c/o nausea and lower abdominal pain that radiates to back, starting last night. Surgery: Part. Hysterectomy; cholecystectomy; appendectomy EXAM: CT ABDOMEN AND PELVIS WITH  CONTRAST TECHNIQUE: Multidetector CT imaging of the abdomen and pelvis was performed using the standard protocol following bolus administration of intravenous contrast. CONTRAST:  133mL OMNIPAQUE IOHEXOL 300 MG/ML  SOLN COMPARISON:  11/08/2017 FINDINGS: Lower chest: No acute abnormality. Hepatobiliary: 7 mm cyst posterior aspect of segment 4B, stable. No other liver masses or lesions. Liver normal in overall size. Gallbladder surgically absent. There is intra and extrahepatic bile duct dilation. Common bile duct measures 14 mm. This is chronic and stable from the prior CT. Pancreas: Unremarkable. No pancreatic ductal dilatation or surrounding inflammatory changes. Spleen: Normal in size without focal abnormality. Adrenals/Urinary Tract: No adrenal masses. Kidneys normal in position with symmetric enhancement and excretion. Mild renal cortical  thinning. 7 mm low-density lesion from the anterior upper pole the left kidney consistent with a cyst. Tiny low-density cortical lesion lateral midpole of the right kidney, also likely a cyst. No other renal masses, no stones and no hydronephrosis. Normal ureters. Normal bladder. Stomach/Bowel: Stable changes from a hiatal hernia repair. Stomach otherwise unremarkable. Small bowel and colon are normal in caliber. No wall thickening or inflammation. Vascular/Lymphatic: Aortic atherosclerosis. No enlarged abdominal or pelvic lymph nodes. Reproductive: Status post hysterectomy. No adnexal masses. Other: No abdominal wall hernia or abnormality. No abdominopelvic ascites. Musculoskeletal: Chronic compression fractures of T11, T12, L1, L2, L3 and L4. All but the L1 fracture have been treated with prior vertebroplasty. Posterior fusion hardware at L1, L2 and L3. No acute fracture. No evidence of loosening of the orthopedic hardware. No osteoblastic or osteolytic lesions. There are changes of chronic avascular necrosis of the right femoral head. When compared to the prior CT, there has been no significant interval change. IMPRESSION: 1. No acute findings.  No findings to account abdominal pain. 2. Chronic findings include chronic intra and extrahepatic bile duct dilation, aortic atherosclerosis, and multiple vertebral fractures. Electronically Signed   By: Lajean Manes M.D.   On: 05/12/2018 16:06      Assessment/Plan Principal Problem:   Nausea & vomiting Active Problems:   Essential hypertension   MGUS (monoclonal gammopathy of unknown significance)   Chronic pain syndrome   Chronic bronchitis (HCC)   CKD (chronic kidney disease) stage 3, GFR 30-59 ml/min (HCC)    1. Intractable nausea vomiting -cause not clear.  Could be gastroenteritis or could be given patient not taking her pain medication causing withdrawal.  Recent CAT scan done 2 days ago was unremarkable.  We will keep patient on liquid diet and  keep patient on antiemetics and also since patient does not want to be on any narcotics we will keep patient on drug withdrawal protocol.  We will try to use buprenorphine if not clonidine for drug withdrawal. 2. Hypertension uncontrolled on amlodipine metoprolol and Cozaar which patient has not taken for last 3 days.  Will restart and keep patient on PRN IV hydralazine. 3. History of asthma presently not wheezing. 4. Chronic pain syndrome on gabapentin Robaxin and used to be on oxycodone which patient does not want to take.  On drug withdrawal protocol.  See #1. 5. Hyperlipidemia on statins.   DVT prophylaxis: Lovenox. Code Status: Full code. Family Communication: Discussed with patient. Disposition Plan: Home. Consults called: None. Admission status: Observation.   Rise Patience MD [facility] Pager [phone].  If 7PM-7AM, please contact night-coverage [email or web address] Password [facility]  05/13/2018, 9:29 PM

## 2018-05-13 NOTE — ED Provider Notes (Signed)
[facility] EMERGENCY DEPARTMENT Provider Note   CSN: [id number] Arrival date & time: 05/13/18  1713     History   Chief Complaint Chief Complaint  Patient presents with  . Nausea  . Emesis    HPI [name] [name] is a 83 y.o. female.  HPI Pt started having nausea and vomiting yesterday.  She was seen in the ED, treated and her sx improved.   She was doing well but then started vomiting again today.  She has vomited every 30 minutes or so.  She tried taking zofran without relief.  She has not been able to eat or take her medications.  She is having abdominal soreness mostly in the upper abdomen.  Pt has had this 4-5 times in the past.  She has seen a GI doctor.  They are not sure of the exact cause.  She takes oxycodone for chronic back pain.  She has not had any medications since Friday.  Past Medical History:  Diagnosis Date  . Anxiety    takes Xanax daily as needed  . Asthma    Albuterol daily as needed  . Blood transfusion without reported diagnosis   . CAD (coronary artery disease)    LHC 2003 with 40% pLAD, 30% mLAD, 100% D1 (moderate vessel), 100%  D2 (small vessel), 95% mid small OM2.  Pt had PTCA to D1;  D2 and OM2 small vessels and tx medically;  Last Myoview (2012): anterior infarct seen with scar, no ischemia. EF normal. Med Rx // Myoview (12/14):  Low risk; ant scar w/ peri-infarct ischemia; EF 55% with ant AK // Myoview 5/16: EF 55-65, ant, apical scar, no ischemia   . Cataract   . Chronic back pain    HNP, DDD, spinal stenosis, vertebral compression fractures.   . Chronic nausea    takes Zofran daily as needed.  normal gastric emptying study 03/2013  . CKD (chronic kidney disease), stage III ([place]) 09/27/2017  . Constipation    felt to be functional. takes Senokot and Miralax daily.  treated with Linzess in past.   . DIVERTICULOSIS, COLON 08/28/2008   Qualifier: Diagnosis of  By: [name], [name], [name]    . Elevated LFTs 2015   probably med  induced DILI, from Augmentin vs Pravastatin  . GERD (gastroesophageal reflux disease)    hx of esophageal stricture   . Hiatal hernia    Paraesophageal hernias. s/p fundoplications in 1017 and 04/2013  . History of bronchitis several of yrs ago  . History of colon polyps 03/2009, 03/2011   adenomatous colon polyps, no high grade dysplasia.   [name] History of vertigo    no meds  . Hyperlipidemia    takes Pravastatin daily  . Hypertension    takes Amlodipine,Metoprolol,and Losartan daily  . Nausea 03/2016  . Osteoporosis   . PONV (postoperative nausea and vomiting)    yrs ago  . Slow urinary stream    takes Rapaflo daily  . TIA (transient ischemic attack) 1995   no residual effects noted    Patient Active Problem List   Diagnosis Date Noted  . Nausea & vomiting 05/13/2018  . Unresponsiveness 04/14/2018  . Hyperkalemia 04/13/2018  . Intractable nausea and vomiting 04/12/2018  . Abnormal glucose 03/01/2018  . FHx: heart disease 03/01/2018  . Therapeutic opioid-induced constipation (OIC) 12/21/2017  . Abnormal UGI series 12/21/2017  . CKD (chronic kidney disease) stage 3, GFR 30-59 ml/min ([facility]) 09/27/2017  . T12 compression fracture ([place]) 09/26/2017  .  Chronic bronchitis ([place]) 08/09/2017  . Atherosclerosis of aorta ([place]) 04/05/2017  . Costochondritis 11/14/2016  . Anxiety state 06/08/2016  . Spondylolisthesis, lumbar region 04/13/2016  . Dysphagia   . Lumbar radiculopathy, acute 12/07/2015    Class: Chronic  . Spinal stenosis, lumbar region, with neurogenic claudication 05/29/2015  . DDD (degenerative disc disease), lumbar 04/21/2015  . Hyperlipidemia, mixed 11/24/2014  . Gastroesophageal reflux disease 11/24/2014  . Depression, major, in partial remission ([place]) 09/26/2014  . Chronic pain syndrome 09/26/2014  . Coronary artery disease due to lipid rich plaque   . Osteoporosis 02/10/2014  . Prediabetes 08/14/2013  . Vitamin D deficiency 08/14/2013  . Medication management  08/14/2013  . Constipation, chronic 04/03/2013  . MGUS (monoclonal gammopathy of unknown significance) 03/05/2013  . Herniated lumbar intervertebral disc 06/25/2012    Class: Acute  . Vertebral compression fracture ([place]) 06/23/2012  . ESOPHAGEAL STRICTURE 08/28/2008  . Mild intermittent asthma without complication 62/13/0865  . Incarcerated recurrent paraesophageal hiatal hernia s/p lap redo repair [id number] 01/31/2008  . Essential hypertension 07/25/2007    Past Surgical History:  Procedure Laterality Date  . APPENDECTOMY     patient unsure of date  . BACK SURGERY    . BLADDER REPAIR    . CHOLECYSTECTOMY     Patient unsure of date.  . CORONARY ANGIOPLASTY  11/16/2001   1 stent  . ESOPHAGEAL MANOMETRY N/A 03/25/2013   Procedure: ESOPHAGEAL MANOMETRY (EM);  Surgeon: Inda Castle, MD;  Location: [facility] ENDOSCOPY;  Service: Endoscopy;  Laterality: N/A;  . ESOPHAGOGASTRODUODENOSCOPY N/A 03/15/2013   Procedure: ESOPHAGOGASTRODUODENOSCOPY (EGD);  Surgeon: Jerene Bears, MD;  Location: [facility] ENDOSCOPY;  Service: Endoscopy;  Laterality: N/A;  . ESOPHAGOGASTRODUODENOSCOPY N/A 12/25/2015   Procedure: ESOPHAGOGASTRODUODENOSCOPY (EGD);  Surgeon: Doran Stabler, MD;  Location: [facility] ENDOSCOPY;  Service: Endoscopy;  Laterality: N/A;  . EYE SURGERY  11/2000   bilateral cataracts with lens implant  . FIXATION KYPHOPLASTY LUMBAR SPINE  X 2   "L3-4"  . HEMORRHOID SURGERY    . HIATAL HERNIA REPAIR  06/03/2011   Procedure: LAPAROSCOPIC REPAIR OF HIATAL HERNIA;  Surgeon: Adin Hector, MD;  Location: [facility] ORS;  Service: General;  Laterality: N/A;  . HIATAL HERNIA REPAIR N/A 04/24/2013   Procedure: LAPAROSCOPIC  REPAIR RECURRENT PARASOPHAGEAL HIATAL HERNIA WITH FUNDOPLICATION;  Surgeon: Adin Hector, MD;  Location: [facility] OR[facility];  Service: General;  Laterality: N/A;  . INSERTION OF MESH N/A 04/24/2013   Procedure: INSERTION OF MESH;  Surgeon: Adin Hector, MD;  Location: [facility] OR[facility];  Service: General;  Laterality:  N/A;  . IR KYPHO THORACIC WITH BONE BIOPSY  09/29/2017  . IR KYPHO THORACIC WITH BONE BIOPSY  11/03/2017  . LAPAROSCOPIC LYSIS OF ADHESIONS N/A 04/24/2013   Procedure: LAPAROSCOPIC LYSIS OF ADHESIONS;  Surgeon: Adin Hector, MD;  Location: [facility] OR[facility];  Service: General;  Laterality: N/A;  . LAPAROSCOPIC NISSEN FUNDOPLICATION  9/52/8413   Procedure: LAPAROSCOPIC NISSEN FUNDOPLICATION;  Surgeon: Adin Hector, MD;  Location: [facility] OR[facility];  Service: General;  Laterality: N/A;  . LUMBAR FUSION  12/07/2015   Right L2-3 facetectomy, posterolateral fusion L2-3 with pedicle screws, rods, local bone graft, Vivigen cancellous chips. Fusion extended to the L1 level with pedicle screws and rods  . LUMBAR LAMINECTOMY/DECOMPRESSION MICRODISCECTOMY Right 06/26/2012   Procedure: LUMBAR LAMINECTOMY/DECOMPRESSION MICRODISCECTOMY;  Surgeon: Jessy Oto, MD;  Location: [facility];  Service: Orthopedics;  Laterality: Right;  RIGHT L4-5 MICRODISCECTOMY  . LUMBAR LAMINECTOMY/DECOMPRESSION MICRODISCECTOMY N/A 05/29/2015   Procedure:  RIGHT L2-3 MICRODISCECTOMY;  Surgeon: Jessy Oto, MD;  Location: [facility];  Service: Orthopedics;  Laterality: N/A;  . TOTAL ABDOMINAL HYSTERECTOMY  1975   partial  . UPPER GASTROINTESTINAL ENDOSCOPY       OB History   No obstetric history on file.      Home Medications    Prior to Admission medications   Medication Sig Start Date End Date Taking? Authorizing Provider  acetaminophen (TYLENOL) 500 MG tablet Take 1 tablet (500 mg total) by mouth every 6 (six) hours as needed. Patient taking differently: Take 500-1,000 mg by mouth every 6 (six) hours as needed for mild pain.  03/18/18   Loura Halt A, NP  albuterol (PROAIR HFA) 108 (90 Base) MCG/ACT inhaler Inhale 2 puffs into the lungs every 6 (six) hours as needed for wheezing or shortness of breath. 09/22/16   Vicie Mutters, PA-C  alendronate (FOSAMAX) 70 MG tablet Take with a full glass of water on an empty stomach. Patient taking  differently: Take 70 mg by mouth once a week. Take with a full glass of water on an empty stomach. 02/16/18   [name], MD  ALPRAZolam Duanne Moron) 0.5 MG tablet Take 1/2 tablet 1- 2 x /day ONLY if needed for Anxiety Attack &  limit to 5 days /week to avoid addiction (Last till Mar 30) Patient taking differently: Take 0.25 mg by mouth as needed for sleep.  05/08/18   [name], MD  amLODipine (NORVASC) 10 MG tablet TAKE 1 TABLET BY MOUTH AT BEDTIME Patient taking differently: Take 10 mg by mouth at bedtime.  09/08/17   [name], MD  aspirin EC 81 MG tablet Take 81 mg by mouth daily.    [provider]  Cholecalciferol (VITAMIN D3) 5000 units CAPS Take 5,000 Units by mouth 2 (two) times daily.     [provider]  diclofenac sodium (VOLTAREN) 1 % GEL Apply 2 g topically 4 (four) times daily. Patient taking differently: Apply 2 g topically 4 (four) times daily as needed (pain).  03/18/18   Bast, Tressia Miners A, NP  dicyclomine (BENTYL) 10 MG capsule TAKE 1 CAPSULE BY MOUTH EVERY 8 HOURS AS NEEDED FOR SPASM Patient taking differently: Take 10 mg by mouth 3 (three) times daily as needed for spasms.  02/05/18   Armbruster, Carlota Raspberry, MD  gabapentin (NEURONTIN) 300 MG capsule TAKE 1 CAPSULE BY MOUTH THREE TIMES DAILY FOR  CHRONIC  PAIN. Patient taking differently: Take 300 mg by mouth 2 (two) times daily.  10/06/17   [name], MD  ipratropium-albuterol (DUONEB) 0.5-2.5 (3) MG/3ML SOLN Take 3 mLs by nebulization every 6 (six) hours as needed (SOB).     [provider]  ketoconazole (NIZORAL) 2 % cream  APPLY ONE APPLICATION TOPICALLY TWO TIMES DAILY Patient taking differently: Apply 1 application topically 2 (two) times daily as needed for irritation.  04/02/18   [name], MD  losartan (COZAAR) 50 MG tablet Take 1 tablet (50 mg total) by mouth daily. 04/15/18   Black, Lezlie Octave, NP  lubiprostone (AMITIZA) 8 MCG capsule Take 1 capsule (8 mcg total) by mouth 2  (two) times daily with a meal. Patient not taking: Reported on 04/12/2018 12/21/17   Zehr, Laban Emperor, PA-C  MAGNESIUM PO Take 1 tablet by mouth daily.     [provider]  meclizine (ANTIVERT) 25 MG tablet TAKE ONE-HALF TO ONE TABLET BY MOUTH UP TO THREE TIMES DAILY FOR MOTION SICKNESS/DIZZINESS Patient taking differently: Take 12.5 mg  by mouth 3 (three) times daily.  04/20/18   Vicie Mutters, PA-C  metoprolol tartrate (LOPRESSOR) 50 MG tablet Take 25 mg by mouth at bedtime.     [provider]  mometasone-formoterol (DULERA) 100-5 MCG/ACT AERO Inhale 2 puffs into the lungs every 12 (twelve) hours. Patient taking differently: Inhale 2 puffs into the lungs every 12 (twelve) hours as needed for wheezing or shortness of breath.  09/08/17   [name], MD  nitroGLYCERIN (NITROSTAT) 0.4 MG SL tablet Place 1 tablet (0.4 mg total) under the tongue every 5 (five) minutes as needed for chest pain. 09/04/14   Larey Dresser, MD  omeprazole (PRILOSEC) 20 MG capsule Take 1 capsule daily for heartburn, Reflux & Nausea 04/17/18   [name], MD  ondansetron (ZOFRAN ODT) 8 MG disintegrating tablet 8mg  ODT q8 hours prn nausea 05/12/18   Orlie Dakin, MD  OVER THE COUNTER MEDICATION Take 1,000 mg by mouth 2 (two) times daily. [place] Calcium 1000mg     [provider]  oxyCODONE-acetaminophen (PERCOCET/ROXICET) 5-325 MG tablet Take 1 tablet by mouth every 4 (four) hours as needed for severe pain. 03/10/17   Tegeler, Gwenyth Allegra, MD  polyethylene glycol powder (GLYCOLAX/MIRALAX) powder Take 17 g by mouth 2 (two) times daily. Patient taking differently: Take 17 g by mouth daily.  05/16/17   Armbruster, Carlota Raspberry, MD  pravastatin (PRAVACHOL) 40 MG tablet TAKE 1 TABLET BY MOUTH DAILY Patient taking differently: Take 40 mg by mouth daily.  03/14/18   [name], MD  promethazine (PHENERGAN) 25 MG tablet Take 1 tablet (25 mg total) by mouth every 6 (six) hours as needed for nausea or  vomiting. 11/11/17   Danford, Suann Larry, MD  senna-docusate (SENOKOT-S) 8.6-50 MG tablet Take 2 tablets by mouth 2 (two) times daily. Patient taking differently: Take 1 tablet by mouth 2 (two) times daily.  12/26/15   Roxan Hockey, MD  sucralfate (CARAFATE) 1 g tablet Take 1 tablet 2 to 4 x /day before meals for Heartburn 04/17/18   [name], MD  tamsulosin (FLOMAX) 0.4 MG CAPS capsule Take 0.4 mg by mouth daily. 03/07/18   [provider]    Family History Family History  Problem Relation Age of Onset  . Colon cancer Mother 44  . Heart attack Father 32       heart attack  . Brain cancer Brother        tumor   . Heart disease Brother   . Heart disease Brother   . Kidney disease Daughter   . Esophageal cancer Neg Hx   . Stomach cancer Neg Hx     Social History Social History   Tobacco Use  . Smoking status: Never Smoker  . Smokeless tobacco: Never Used  Substance Use Topics  . Alcohol use: No  . Drug use: Never     Allergies   Ace inhibitors; Aspartame; Atorvastatin; Biaxin [clarithromycin]; Daliresp [roflumilast]; Erythromycin; and Levofloxacin   Review of Systems Review of Systems  All other systems reviewed and are negative.    Physical Exam Updated Vital Signs BP (!) 158/88   Pulse 87   Temp 98 F (36.7 C) (Oral)   Resp 18   Ht 1.6 m (5\' 3" )   Wt 73.5 kg   SpO2 95%   BMI 28.70 kg/m   Physical Exam Vitals signs and nursing note reviewed.  Constitutional:      General: She is not in acute distress.    Appearance: She is well-developed.  She is ill-appearing.  HENT:     Head: Normocephalic and atraumatic.     Right Ear: External ear normal.     Left Ear: External ear normal.  Eyes:     General: No scleral icterus.       Right eye: No discharge.        Left eye: No discharge.     Conjunctiva/sclera: Conjunctivae normal.  Neck:     Musculoskeletal: Neck supple.     Trachea: No tracheal deviation.  Cardiovascular:     Rate  and Rhythm: Normal rate and regular rhythm.  Pulmonary:     Effort: Pulmonary effort is normal. No respiratory distress.     Breath sounds: Normal breath sounds. No stridor. No wheezing or rales.  Abdominal:     General: Bowel sounds are normal. There is no distension.     Palpations: Abdomen is soft.     Tenderness: There is abdominal tenderness in the epigastric area. There is no guarding or rebound.  Musculoskeletal:        General: No tenderness.  Skin:    General: Skin is warm and dry.     Findings: No rash.  Neurological:     Cranial Nerves: No cranial nerve deficit (no facial droop, extraocular movements intact, no slurred speech).     Sensory: No sensory deficit.     Motor: No abnormal muscle tone or seizure activity.     Coordination: Coordination normal.      ED Treatments / Results  Labs (all labs ordered are listed, but only abnormal results are displayed) Labs Reviewed  COMPREHENSIVE METABOLIC PANEL - Abnormal; Notable for the following components:      Result Value   CO2 20 (*)    Glucose, Bld 124 (*)    All other components within normal limits  CBC WITH DIFFERENTIAL/PLATELET - Abnormal; Notable for the following components:   Hemoglobin 15.6 (*)    HCT 48.2 (*)    All other components within normal limits  URINALYSIS, ROUTINE W REFLEX MICROSCOPIC - Abnormal; Notable for the following components:   Ketones, ur 20 (*)    Protein, ur 30 (*)    All other components within normal limits  LIPASE, BLOOD     Radiology Dg Chest 2 View  Result Date: 05/12/2018 CLINICAL DATA:  Vomiting.  Headache.  Symptoms since last night. EXAM: CHEST - 2 VIEW COMPARISON:  04/12/2018 FINDINGS: The heart is mildly enlarged. Normal vascularity. Lungs are under aerated with linear atelectasis at the right lung base. No pneumothorax or pleural effusion. Status post vertebral augmentation and spinal instrumentation in the lower thoracic spine and lumbar spine. IMPRESSION: Cardiomegaly  without decompensation. Electronically Signed   By: Marybelle Killings M.D.   On: 05/12/2018 11:00   Ct Abdomen Pelvis W Contrast  Result Date: 05/12/2018 CLINICAL DATA:  Pt c/o nausea and lower abdominal pain that radiates to back, starting last night. Surgery: Part. Hysterectomy; cholecystectomy; appendectomy EXAM: CT ABDOMEN AND PELVIS WITH CONTRAST TECHNIQUE: Multidetector CT imaging of the abdomen and pelvis was performed using the standard protocol following bolus administration of intravenous contrast. CONTRAST:  177mL OMNIPAQUE IOHEXOL 300 MG/ML  SOLN COMPARISON:  11/08/2017 FINDINGS: Lower chest: No acute abnormality. Hepatobiliary: 7 mm cyst posterior aspect of segment 4B, stable. No other liver masses or lesions. Liver normal in overall size. Gallbladder surgically absent. There is intra and extrahepatic bile duct dilation. Common bile duct measures 14 mm. This is chronic and stable from the prior CT. Pancreas:  Unremarkable. No pancreatic ductal dilatation or surrounding inflammatory changes. Spleen: Normal in size without focal abnormality. Adrenals/Urinary Tract: No adrenal masses. Kidneys normal in position with symmetric enhancement and excretion. Mild renal cortical thinning. 7 mm low-density lesion from the anterior upper pole the left kidney consistent with a cyst. Tiny low-density cortical lesion lateral midpole of the right kidney, also likely a cyst. No other renal masses, no stones and no hydronephrosis. Normal ureters. Normal bladder. Stomach/Bowel: Stable changes from a hiatal hernia repair. Stomach otherwise unremarkable. Small bowel and colon are normal in caliber. No wall thickening or inflammation. Vascular/Lymphatic: Aortic atherosclerosis. No enlarged abdominal or pelvic lymph nodes. Reproductive: Status post hysterectomy. No adnexal masses. Other: No abdominal wall hernia or abnormality. No abdominopelvic ascites. Musculoskeletal: Chronic compression fractures of T11, T12, L1, L2, L3 and  L4. All but the L1 fracture have been treated with prior vertebroplasty. Posterior fusion hardware at L1, L2 and L3. No acute fracture. No evidence of loosening of the orthopedic hardware. No osteoblastic or osteolytic lesions. There are changes of chronic avascular necrosis of the right femoral head. When compared to the prior CT, there has been no significant interval change. IMPRESSION: 1. No acute findings.  No findings to account abdominal pain. 2. Chronic findings include chronic intra and extrahepatic bile duct dilation, aortic atherosclerosis, and multiple vertebral fractures. Electronically Signed   By: Lajean Manes M.D.   On: 05/12/2018 16:06    Procedures Procedures (including critical care time)  Medications Ordered in ED Medications  sodium chloride 0.9 % bolus 1,000 mL (0 mLs Intravenous Stopped 05/13/18 1947)    And  0.9 %  sodium chloride infusion ( Intravenous New Bag/Given 05/13/18 1909)  ondansetron (ZOFRAN) injection 4 mg (4 mg Intravenous Given 05/13/18 1815)  morphine 4 MG/ML injection 4 mg (4 mg Intravenous Given 05/13/18 1815)  promethazine (PHENERGAN) injection 12.5 mg (12.5 mg Intravenous Given 05/13/18 1825)  promethazine (PHENERGAN) injection 12.5 mg (12.5 mg Intravenous Given 05/13/18 2033)     Initial Impression / Assessment and Plan / ED Course  I have reviewed the triage vital signs and the nursing notes.  Pertinent labs & imaging results that were available during my care of the patient were reviewed by me and considered in my medical decision making (see chart for details).  Clinical Course as of May 13 2033  Nancy Fetter May 13, 2018  1732 Imaging reviewed from yesterday which included a CT scan of her abdomen pelvis    [JK]  1812 No improvement with initial dose of Zofran.  Patient requests a dose of Phenergan.   [JK]  2008 Pt still feeling nauseated.  States she is unable to drink.  Pt does have ketonuria.  Slight decrease in bicarb   [JK]    Clinical Course User  Index [JK] Dorie Rank, MD   Pt presented  With persistent nausea and vomiting.  Labs notable for decreased bicarb and ketones in the urine consistent with dehydration.  Sx persisted despite IV fluids and antiemetics. ?viral illness.  No obstruction or infection noted on CT yesterday.  Will admit for iv hydration, observation  Final Clinical Impressions(s) / ED Diagnoses   Final diagnoses:  Nausea and vomiting, intractability of vomiting not specified, unspecified vomiting type  Dehydration      Dorie Rank, MD 05/13/18 2037

## 2018-05-13 NOTE — ED Triage Notes (Signed)
Pt c/o nausea and vomitingx 2 days. Was seen yesterday for same.

## 2018-05-14 DIAGNOSIS — G894 Chronic pain syndrome: Secondary | ICD-10-CM

## 2018-05-14 DIAGNOSIS — E860 Dehydration: Principal | ICD-10-CM

## 2018-05-14 LAB — CBC
HCT: 40.9 % (ref 36.0–46.0)
HEMOGLOBIN: 13.7 g/dL (ref 12.0–15.0)
MCH: 31.9 pg (ref 26.0–34.0)
MCHC: 33.5 g/dL (ref 30.0–36.0)
MCV: 95.1 fL (ref 80.0–100.0)
Platelets: 302 10*3/uL (ref 150–400)
RBC: 4.3 MIL/uL (ref 3.87–5.11)
RDW: 13.3 % (ref 11.5–15.5)
WBC: 6.6 10*3/uL (ref 4.0–10.5)
nRBC: 0 % (ref 0.0–0.2)

## 2018-05-14 LAB — HEPATIC FUNCTION PANEL
ALK PHOS: 43 U/L (ref 38–126)
ALT: 21 U/L (ref 0–44)
AST: 22 U/L (ref 15–41)
Albumin: 3.4 g/dL — ABNORMAL LOW (ref 3.5–5.0)
Bilirubin, Direct: 0.2 mg/dL (ref 0.0–0.2)
Indirect Bilirubin: 1.1 mg/dL — ABNORMAL HIGH (ref 0.3–0.9)
Total Bilirubin: 1.3 mg/dL — ABNORMAL HIGH (ref 0.3–1.2)
Total Protein: 6.2 g/dL — ABNORMAL LOW (ref 6.5–8.1)

## 2018-05-14 LAB — BASIC METABOLIC PANEL
Anion gap: 10 (ref 5–15)
BUN: 8 mg/dL (ref 8–23)
CO2: 18 mmol/L — ABNORMAL LOW (ref 22–32)
Calcium: 8.3 mg/dL — ABNORMAL LOW (ref 8.9–10.3)
Chloride: 110 mmol/L (ref 98–111)
Creatinine, Ser: 0.8 mg/dL (ref 0.44–1.00)
GFR calc Af Amer: >60 mL/min (ref >60)
GFR calc non Af Amer: >60 mL/min (ref >60)
GLUCOSE: 97 mg/dL (ref 70–99)
POTASSIUM: 3.4 mmol/L — AB (ref 3.5–5.1)
Sodium: 138 mmol/L (ref 135–145)

## 2018-05-14 LAB — TROPONIN I: Troponin I: <0.03 ng/mL (ref <0.03)

## 2018-05-14 MED ORDER — BUPRENORPHINE HCL-NALOXONE HCL 8-2 MG SL SUBL: 1.0000 | SUBLINGUAL_TABLET | Freq: Two times a day (BID) | SUBLINGUAL | Status: DC

## 2018-05-14 MED ORDER — POTASSIUM CHLORIDE IN NACL 20-0.9 MEQ/L-% IV SOLN
INTRAVENOUS | Status: DC
  Administered 2018-05-14 – 2018-05-16 (×4): via INTRAVENOUS
  Filled 2018-05-14 (×4): qty 1000

## 2018-05-14 MED ORDER — LOSARTAN POTASSIUM 50 MG PO TABS
25.0000 mg | ORAL_TABLET | Freq: Every day | ORAL | Status: DC
  Administered 2018-05-15 – 2018-05-16 (×2): 25 mg via ORAL
  Filled 2018-05-14 (×2): qty 1

## 2018-05-14 MED ORDER — BUPRENORPHINE HCL-NALOXONE HCL 2-0.5 MG SL SUBL: 1.0000 | SUBLINGUAL_TABLET | SUBLINGUAL | Status: DC | PRN

## 2018-05-14 MED ORDER — PROMETHAZINE HCL 25 MG/ML IJ SOLN
6.2500 mg | Freq: Four times a day (QID) | INTRAMUSCULAR | Status: DC | PRN
  Administered 2018-05-14 – 2018-05-15 (×3): 6.25 mg via INTRAVENOUS
  Filled 2018-05-14 (×3): qty 1

## 2018-05-14 MED ORDER — METOCLOPRAMIDE HCL 5 MG/ML IJ SOLN
5.0000 mg | Freq: Once | INTRAMUSCULAR | Status: AC
  Administered 2018-05-14: 5 mg via INTRAVENOUS
  Filled 2018-05-14: qty 2

## 2018-05-14 MED ORDER — CLONIDINE HCL 0.1 MG PO TABS
0.1000 mg | ORAL_TABLET | Freq: Two times a day (BID) | ORAL | Status: DC
  Administered 2018-05-14 – 2018-05-15 (×3): 0.1 mg via ORAL
  Filled 2018-05-14 (×3): qty 1

## 2018-05-14 NOTE — Progress Notes (Signed)
Progress Note    [name] [name]  PIR:[id number] DOB: 1935-08-25  DOA: 05/13/2018 PCP: [name], MD    Brief Narrative:     Medical records reviewed and are as summarized below:  [name] [name] is an 83 y.o. female with history of hiatal hernia status post fundoplication, hypertension, hyperlipidemia, asthma, chronic pain syndrome who was admitted last month in December 1 week with nausea vomiting presents with similar complaints with persistent vomiting over the last 4 days.   Assessment/Plan:   Principal Problem:   Nausea & vomiting Active Problems:   Essential hypertension   MGUS (monoclonal gammopathy of unknown significance)   Chronic pain syndrome   Chronic bronchitis (HCC)   CKD (chronic kidney disease) stage 3, GFR 30-59 ml/min (HCC)  Intractable nausea/vomiting/d -cause not clear -similar to December-- when she left then, it had resolved -began again after eating a salad at [employer]-- Could be gastroenteritis -less likely is pain medication causing withdrawal if patient is being truthful with her medications use -doubt neurologic issue as it had resolved-- could consider doing CT scan of brain to r/o tumor -? Need for GI-- will monitor for improvement prior to consulting  Hypokalemia -replace in IVF Mg  Hypertension  -start clonidine for ? Withdrawal in place of suboxone -decrease home BP meds and monitor  History of asthma -stable  chronic pain syndrome -on gabapentin/ Robaxin -used to be on oxycodone which patient does not want to take-- per patient, she was only taking 2 pills/day and for < 2 years  Hyperlipidemia -statin   Family Communication/Anticipated D/C date and plan/Code Status   DVT prophylaxis: Lovenox ordered. Code Status: Full Code.  Family Communication:  Disposition Plan: suspect will need several more days of treatment in the hospital getting IVF and IV medications for nausea/vomiting-- cause is still  unclear   Medical Consultants:    None.    Subjective:   Says she was only taking 2 pain pills a day when she stopped on Friday  Objective:    Vitals:   05/13/18 2030 05/13/18 2124 05/14/18 0545 05/14/18 1400  BP: (!) 158/88 (!) 175/89 (!) 158/96 110/63  Pulse: 87 88 74 (!) 57  Resp:  19 17 16   Temp:  98.4 F (36.9 C) 98 F (36.7 C) 98.2 F (36.8 C)  TempSrc:  Oral Oral Oral  SpO2: 95% 96% 96% 96%  Weight:      Height:        Intake/Output Summary (Last 24 hours) at 05/14/2018 1516 Last data filed at 05/14/2018 0755 Gross per 24 hour  Intake 220 ml  Output -  Net 220 ml   Filed Weights   05/13/18 1821  Weight: 73.5 kg    Exam: In bed, chronically ill appearing No LE edema Diminished bowel sounds, NT, ND -no LE edema Alert Cooperative but appears uncomfortable  Data Reviewed:   I have personally reviewed following labs and imaging studies:  Labs: Labs show the following:   Basic Metabolic Panel: Recent Labs  Lab 05/12/18 1025 05/13/18 1806 05/14/18 0544  NA 137 135 138  K 3.8 3.8 3.4*  CL 106 103 110  CO2 21* 20* 18*  GLUCOSE 151* 124* 97  BUN 9 12 8   CREATININE 0.76 0.88 0.80  CALCIUM 9.3 9.3 8.3*   GFR Estimated Creatinine Clearance: 53.8 mL/min (by C-G formula based on SCr of 0.8 mg/dL). Liver Function Tests: Recent Labs  Lab 05/12/18 1025 05/13/18 1806 05/14/18 0544  AST 23  27 22  ALT 19 22 21   ALKPHOS 52 48 43  BILITOT 0.7 1.0 1.3*  PROT 7.5 7.6 6.2*  ALBUMIN 4.1 4.2 3.4*   Recent Labs  Lab 05/12/18 1025 05/13/18 1806  LIPASE 21 21   No results for input(s): AMMONIA in the last 168 hours. Coagulation profile No results for input(s): INR, PROTIME in the last 168 hours.  CBC: Recent Labs  Lab 05/12/18 1025 05/13/18 1806 05/14/18 0544  WBC 8.6 7.6 6.6  NEUTROABS 7.0 5.7  --   HGB 14.9 15.6* 13.7  HCT 45.2 48.2* 40.9  MCV 95.0 95.8 95.1  PLT 315 313 302   Cardiac Enzymes: Recent Labs  Lab 05/14/18 0544   TROPONINI <0.03   BNP (last 3 results) No results for input(s): PROBNP in the last 8760 hours. CBG: No results for input(s): GLUCAP in the last 168 hours. D-Dimer: No results for input(s): DDIMER in the last 72 hours. Hgb A1c: No results for input(s): HGBA1C in the last 72 hours. Lipid Profile: No results for input(s): CHOL, HDL, LDLCALC, TRIG, CHOLHDL, LDLDIRECT in the last 72 hours. Thyroid function studies: No results for input(s): TSH, T4TOTAL, T3FREE, THYROIDAB in the last 72 hours.  Invalid input(s): FREET3 Anemia work up: No results for input(s): VITAMINB12, FOLATE, FERRITIN, TIBC, IRON, RETICCTPCT in the last 72 hours. Sepsis Labs: Recent Labs  Lab 05/12/18 1025 05/13/18 1806 05/14/18 0544  WBC 8.6 7.6 6.6    Microbiology No results found for this or any previous visit (from the past 240 hour(s)).  Procedures and diagnostic studies:  Ct Abdomen Pelvis W Contrast  Result Date: 05/12/2018 CLINICAL DATA:  Pt c/o nausea and lower abdominal pain that radiates to back, starting last night. Surgery: Part. Hysterectomy; cholecystectomy; appendectomy EXAM: CT ABDOMEN AND PELVIS WITH CONTRAST TECHNIQUE: Multidetector CT imaging of the abdomen and pelvis was performed using the standard protocol following bolus administration of intravenous contrast. CONTRAST:  123mL OMNIPAQUE IOHEXOL 300 MG/ML  SOLN COMPARISON:  11/08/2017 FINDINGS: Lower chest: No acute abnormality. Hepatobiliary: 7 mm cyst posterior aspect of segment 4B, stable. No other liver masses or lesions. Liver normal in overall size. Gallbladder surgically absent. There is intra and extrahepatic bile duct dilation. Common bile duct measures 14 mm. This is chronic and stable from the prior CT. Pancreas: Unremarkable. No pancreatic ductal dilatation or surrounding inflammatory changes. Spleen: Normal in size without focal abnormality. Adrenals/Urinary Tract: No adrenal masses. Kidneys normal in position with symmetric  enhancement and excretion. Mild renal cortical thinning. 7 mm low-density lesion from the anterior upper pole the left kidney consistent with a cyst. Tiny low-density cortical lesion lateral midpole of the right kidney, also likely a cyst. No other renal masses, no stones and no hydronephrosis. Normal ureters. Normal bladder. Stomach/Bowel: Stable changes from a hiatal hernia repair. Stomach otherwise unremarkable. Small bowel and colon are normal in caliber. No wall thickening or inflammation. Vascular/Lymphatic: Aortic atherosclerosis. No enlarged abdominal or pelvic lymph nodes. Reproductive: Status post hysterectomy. No adnexal masses. Other: No abdominal wall hernia or abnormality. No abdominopelvic ascites. Musculoskeletal: Chronic compression fractures of T11, T12, L1, L2, L3 and L4. All but the L1 fracture have been treated with prior vertebroplasty. Posterior fusion hardware at L1, L2 and L3. No acute fracture. No evidence of loosening of the orthopedic hardware. No osteoblastic or osteolytic lesions. There are changes of chronic avascular necrosis of the right femoral head. When compared to the prior CT, there has been no significant interval change. IMPRESSION: 1. No acute  findings.  No findings to account abdominal pain. 2. Chronic findings include chronic intra and extrahepatic bile duct dilation, aortic atherosclerosis, and multiple vertebral fractures. Electronically Signed   By: Lajean Manes M.D.   On: 05/12/2018 16:06    Medications:   . amLODipine  10 mg Oral QHS  . aspirin EC  81 mg Oral QHS  . cloNIDine  0.1 mg Oral BID  . enoxaparin (LOVENOX) injection  40 mg Subcutaneous Q24H  . gabapentin  300 mg Oral BID  . losartan  50 mg Oral Daily  . magnesium oxide  400 mg Oral Daily  . metoprolol tartrate  25 mg Oral QHS  . pantoprazole  40 mg Oral Daily  . polyethylene glycol  17 g Oral Daily  . pravastatin  40 mg Oral QHS  . senna-docusate  1 tablet Oral BID  . sucralfate  1 g Oral  TID AC  . tamsulosin  0.4 mg Oral Daily   Continuous Infusions: . sodium chloride 100 mL/hr at 05/14/18 0617     LOS: 0 days   Geradine Girt  [facility]   *Please refer to [email or web address], password [email or web address] to get updated schedule on who will round on this patient, as hospitalists switch teams weekly. If 7PM-7AM, please contact night-coverage at [email or web address], password [email or web address] for any overnight needs.  05/14/2018, 3:16 PM

## 2018-05-14 NOTE — Progress Notes (Signed)
Chaplain responded to spiritual consult. Assess spiritual needs. Patient in discomfort. "My legs hurt."  Chaplain offered ministry of presence and prayer. Chaplain also ascertained that patient completed a living will 5 years ago. She was expecting visit from husband. Will be available as needed. Tamsen Snider Pager [phone]

## 2018-05-14 NOTE — Care Management Obs Status (Signed)
[facility] NOTIFICATION   Patient Details  Name: [name] [name] MRN: [id number] Date of Birth: 09-Jan-1936   Medicare Observation Status Notification Given:  Yes    Midge Minium RN, BSN, NCM-BC, ACM-RN [phone] 05/14/2018, 3:56 PM

## 2018-05-15 DIAGNOSIS — I2583 Coronary atherosclerosis due to lipid rich plaque: Secondary | ICD-10-CM | POA: Diagnosis present

## 2018-05-15 DIAGNOSIS — Z7982 Long term (current) use of aspirin: Secondary | ICD-10-CM | POA: Diagnosis not present

## 2018-05-15 DIAGNOSIS — I2510 Atherosclerotic heart disease of native coronary artery without angina pectoris: Secondary | ICD-10-CM | POA: Diagnosis present

## 2018-05-15 DIAGNOSIS — K5900 Constipation, unspecified: Secondary | ICD-10-CM | POA: Diagnosis present

## 2018-05-15 DIAGNOSIS — Z9049 Acquired absence of other specified parts of digestive tract: Secondary | ICD-10-CM | POA: Diagnosis not present

## 2018-05-15 DIAGNOSIS — K219 Gastro-esophageal reflux disease without esophagitis: Secondary | ICD-10-CM | POA: Diagnosis present

## 2018-05-15 DIAGNOSIS — N183 Chronic kidney disease, stage 3 (moderate): Secondary | ICD-10-CM | POA: Diagnosis present

## 2018-05-15 DIAGNOSIS — Z955 Presence of coronary angioplasty implant and graft: Secondary | ICD-10-CM | POA: Diagnosis not present

## 2018-05-15 DIAGNOSIS — Z9841 Cataract extraction status, right eye: Secondary | ICD-10-CM | POA: Diagnosis not present

## 2018-05-15 DIAGNOSIS — M810 Age-related osteoporosis without current pathological fracture: Secondary | ICD-10-CM | POA: Diagnosis present

## 2018-05-15 DIAGNOSIS — K529 Noninfective gastroenteritis and colitis, unspecified: Secondary | ICD-10-CM | POA: Diagnosis present

## 2018-05-15 DIAGNOSIS — D472 Monoclonal gammopathy: Secondary | ICD-10-CM | POA: Diagnosis present

## 2018-05-15 DIAGNOSIS — Z8673 Personal history of transient ischemic attack (TIA), and cerebral infarction without residual deficits: Secondary | ICD-10-CM | POA: Diagnosis not present

## 2018-05-15 DIAGNOSIS — G894 Chronic pain syndrome: Secondary | ICD-10-CM | POA: Diagnosis present

## 2018-05-15 DIAGNOSIS — Z981 Arthrodesis status: Secondary | ICD-10-CM | POA: Diagnosis not present

## 2018-05-15 DIAGNOSIS — E876 Hypokalemia: Secondary | ICD-10-CM | POA: Diagnosis present

## 2018-05-15 DIAGNOSIS — J4520 Mild intermittent asthma, uncomplicated: Secondary | ICD-10-CM | POA: Diagnosis present

## 2018-05-15 DIAGNOSIS — Z9842 Cataract extraction status, left eye: Secondary | ICD-10-CM | POA: Diagnosis not present

## 2018-05-15 DIAGNOSIS — Z961 Presence of intraocular lens: Secondary | ICD-10-CM | POA: Diagnosis present

## 2018-05-15 DIAGNOSIS — Z90710 Acquired absence of both cervix and uterus: Secondary | ICD-10-CM | POA: Diagnosis not present

## 2018-05-15 DIAGNOSIS — I1310 Hypertensive heart and chronic kidney disease without heart failure, with stage 1 through stage 4 chronic kidney disease, or unspecified chronic kidney disease: Secondary | ICD-10-CM | POA: Diagnosis present

## 2018-05-15 DIAGNOSIS — E860 Dehydration: Secondary | ICD-10-CM | POA: Diagnosis present

## 2018-05-15 DIAGNOSIS — F419 Anxiety disorder, unspecified: Secondary | ICD-10-CM | POA: Diagnosis present

## 2018-05-15 DIAGNOSIS — R112 Nausea with vomiting, unspecified: Secondary | ICD-10-CM | POA: Diagnosis not present

## 2018-05-15 DIAGNOSIS — E782 Mixed hyperlipidemia: Secondary | ICD-10-CM | POA: Diagnosis present

## 2018-05-15 LAB — COMPREHENSIVE METABOLIC PANEL
ALT: 18 U/L (ref 0–44)
AST: 18 U/L (ref 15–41)
Albumin: 3 g/dL — ABNORMAL LOW (ref 3.5–5.0)
Alkaline Phosphatase: 37 U/L — ABNORMAL LOW (ref 38–126)
Anion gap: 7 (ref 5–15)
BUN: 7 mg/dL — AB (ref 8–23)
CO2: 24 mmol/L (ref 22–32)
Calcium: 8.5 mg/dL — ABNORMAL LOW (ref 8.9–10.3)
Chloride: 111 mmol/L (ref 98–111)
Creatinine, Ser: 0.83 mg/dL (ref 0.44–1.00)
GFR calc Af Amer: >60 mL/min (ref >60)
GFR calc non Af Amer: >60 mL/min (ref >60)
Glucose, Bld: 95 mg/dL (ref 70–99)
POTASSIUM: 3.5 mmol/L (ref 3.5–5.1)
Sodium: 142 mmol/L (ref 135–145)
Total Bilirubin: 1.2 mg/dL (ref 0.3–1.2)
Total Protein: 5.7 g/dL — ABNORMAL LOW (ref 6.5–8.1)

## 2018-05-15 LAB — CBC
HCT: 39.9 % (ref 36.0–46.0)
Hemoglobin: 12.9 g/dL (ref 12.0–15.0)
MCH: 30.9 pg (ref 26.0–34.0)
MCHC: 32.3 g/dL (ref 30.0–36.0)
MCV: 95.7 fL (ref 80.0–100.0)
Platelets: 268 10*3/uL (ref 150–400)
RBC: 4.17 MIL/uL (ref 3.87–5.11)
RDW: 13.3 % (ref 11.5–15.5)
WBC: 5.8 10*3/uL (ref 4.0–10.5)
nRBC: 0 % (ref 0.0–0.2)

## 2018-05-15 LAB — MAGNESIUM: Magnesium: 1.9 mg/dL (ref 1.7–2.4)

## 2018-05-15 MED ORDER — DICLOFENAC SODIUM 1 % TD GEL
2.0000 g | Freq: Four times a day (QID) | TRANSDERMAL | Status: DC
  Administered 2018-05-15 – 2018-05-16 (×3): 2 g via TOPICAL
  Filled 2018-05-15: qty 100

## 2018-05-15 NOTE — Progress Notes (Signed)
Progress Note    ANNALISIA [name]  HQP:[id number] DOB: 08-Jan-1936  DOA: 05/13/2018 PCP: [name], MD    Brief Narrative:     Medical records reviewed and are as summarized below:  [name] [name] is an 83 y.o. female with history of hiatal hernia status post fundoplication, hypertension, hyperlipidemia, asthma, chronic pain syndrome who was admitted last month in December with nausea/vomiting presents with similar complaints with persistent vomiting that started 4 days ago after a salad at [employer].   Assessment/Plan:   Principal Problem:   Nausea & vomiting Active Problems:   Essential hypertension   MGUS (monoclonal gammopathy of unknown significance)   Chronic pain syndrome   Chronic bronchitis (HCC)   CKD (chronic kidney disease) stage 3, GFR 30-59 ml/min (HCC)   Intractable nausea and vomiting  Intractable nausea/vomiting-- now having diarrhea -cause not clear -similar to December-- when she left then it had resolved -began again after eating a salad at [employer]-- Could be gastroenteritis- check GI panel -less likely is pain medication causing withdrawal if patient is being truthful with her medications use (only 2 pills/day) -doubt neurologic issue as it had resolved-- could consider doing CT scan of brain to r/o tumor -? Need for GI (follow with [facility])-- will monitor for improvement prior to consulting ? Gastroparesis-- episode happened after salad  Hypokalemia -replace in IVF Mg normal  Hypertension  -decrease home BP meds and monitor  History of asthma -stable  chronic pain syndrome -on gabapentin/ Robaxin -used to be on oxycodone which patient does not want to take-- per patient, she was only taking 2 pills/day and for < 2 years  Hyperlipidemia -statin  Patient continues with poor PO intake and symptoms.  2nd episode in 1 month-- patient is a poor historian and history is difficult to discern.  Will continue IVF-- manage symptoms as best we  can.  Family Communication/Anticipated D/C date and plan/Code Status   DVT prophylaxis: Lovenox ordered. Code Status: Full Code.  Family Communication: husband at bedside    Medical Consultants:    None.    Subjective:   C/o ankle pain on right    Objective:    Vitals:   05/14/18 1800 05/14/18 2120 05/15/18 0500 05/15/18 1437  BP: 92/81 (!) 159/75 (!) 144/70 140/69  Pulse: 66 (!) 58 (!) 55 (!) 44  Resp: 16 18 18 16   Temp: 98.4 F (36.9 C) 98 F (36.7 C) 97.7 F (36.5 C) (!) 97.4 F (36.3 C)  TempSrc: Oral Oral  Oral  SpO2: 99% 94% 95% 98%  Weight:      Height:        Intake/Output Summary (Last 24 hours) at 05/15/2018 1520 Last data filed at 05/14/2018 2330 Gross per 24 hour  Intake 120 ml  Output -  Net 120 ml   Filed Weights   05/13/18 1821  Weight: 73.5 kg    Exam: In bed, NAD Right foot with small area of tenderness over bony prominence HR slow but regular No increased work of breathing +BS, soft NT  Data Reviewed:   I have personally reviewed following labs and imaging studies:  Labs: Labs show the following:   Basic Metabolic Panel: Recent Labs  Lab 05/12/18 1025 05/13/18 1806 05/14/18 0544 05/15/18 0512  NA 137 135 138 142  K 3.8 3.8 3.4* 3.5  CL 106 103 110 111  CO2 21* 20* 18* 24  GLUCOSE 151* 124* 97 95  BUN 9 12 8  7*  CREATININE 0.76 0.88 0.80 0.83  CALCIUM 9.3 9.3 8.3* 8.5*  MG  --   --   --  1.9   GFR Estimated Creatinine Clearance: 51.9 mL/min (by C-G formula based on SCr of 0.83 mg/dL). Liver Function Tests: Recent Labs  Lab 05/12/18 1025 05/13/18 1806 05/14/18 0544 05/15/18 0512  AST 23 27 22 18   ALT 19 22 21 18   ALKPHOS 52 48 43 37*  BILITOT 0.7 1.0 1.3* 1.2  PROT 7.5 7.6 6.2* 5.7*  ALBUMIN 4.1 4.2 3.4* 3.0*   Recent Labs  Lab 05/12/18 1025 05/13/18 1806  LIPASE 21 21   No results for input(s): AMMONIA in the last 168 hours. Coagulation profile No results for input(s): INR, PROTIME in the last 168  hours.  CBC: Recent Labs  Lab 05/12/18 1025 05/13/18 1806 05/14/18 0544 05/15/18 0512  WBC 8.6 7.6 6.6 5.8  NEUTROABS 7.0 5.7  --   --   HGB 14.9 15.6* 13.7 12.9  HCT 45.2 48.2* 40.9 39.9  MCV 95.0 95.8 95.1 95.7  PLT 315 313 302 268   Cardiac Enzymes: Recent Labs  Lab 05/14/18 0544  TROPONINI <0.03   BNP (last 3 results) No results for input(s): PROBNP in the last 8760 hours. CBG: No results for input(s): GLUCAP in the last 168 hours. D-Dimer: No results for input(s): DDIMER in the last 72 hours. Hgb A1c: No results for input(s): HGBA1C in the last 72 hours. Lipid Profile: No results for input(s): CHOL, HDL, LDLCALC, TRIG, CHOLHDL, LDLDIRECT in the last 72 hours. Thyroid function studies: No results for input(s): TSH, T4TOTAL, T3FREE, THYROIDAB in the last 72 hours.  Invalid input(s): FREET3 Anemia work up: No results for input(s): VITAMINB12, FOLATE, FERRITIN, TIBC, IRON, RETICCTPCT in the last 72 hours. Sepsis Labs: Recent Labs  Lab 05/12/18 1025 05/13/18 1806 05/14/18 0544 05/15/18 0512  WBC 8.6 7.6 6.6 5.8    Microbiology No results found for this or any previous visit (from the past 240 hour(s)).  Procedures and diagnostic studies:  No results found.  Medications:   . aspirin EC  81 mg Oral QHS  . diclofenac sodium  2 g Topical QID  . enoxaparin (LOVENOX) injection  40 mg Subcutaneous Q24H  . gabapentin  300 mg Oral BID  . losartan  25 mg Oral Daily  . magnesium oxide  400 mg Oral Daily  . metoprolol tartrate  25 mg Oral QHS  . pantoprazole  40 mg Oral Daily  . polyethylene glycol  17 g Oral Daily  . pravastatin  40 mg Oral QHS  . senna-docusate  1 tablet Oral BID  . sucralfate  1 g Oral TID AC  . tamsulosin  0.4 mg Oral Daily   Continuous Infusions: . 0.9 % NaCl with KCl 20 mEq / L 75 mL/hr at 05/15/18 0509     LOS: 0 days   Geradine Girt  [facility]   *Please refer to [email or web address], password [email or web address] to get updated schedule on  who will round on this patient, as hospitalists switch teams weekly. If 7PM-7AM, please contact night-coverage at [email or web address], password [email or web address] for any overnight needs.  05/15/2018, 3:20 PM

## 2018-05-16 DIAGNOSIS — N183 Chronic kidney disease, stage 3 (moderate): Secondary | ICD-10-CM

## 2018-05-16 LAB — BASIC METABOLIC PANEL
ANION GAP: 7 (ref 5–15)
BUN: 5 mg/dL — ABNORMAL LOW (ref 8–23)
CO2: 22 mmol/L (ref 22–32)
Calcium: 9 mg/dL (ref 8.9–10.3)
Chloride: 112 mmol/L — ABNORMAL HIGH (ref 98–111)
Creatinine, Ser: 0.78 mg/dL (ref 0.44–1.00)
GFR calc Af Amer: >60 mL/min (ref >60)
GFR calc non Af Amer: >60 mL/min (ref >60)
Glucose, Bld: 96 mg/dL (ref 70–99)
Potassium: 3.7 mmol/L (ref 3.5–5.1)
Sodium: 141 mmol/L (ref 135–145)

## 2018-05-16 LAB — CBC
HEMATOCRIT: 41 % (ref 36.0–46.0)
Hemoglobin: 13.3 g/dL (ref 12.0–15.0)
MCH: 31 pg (ref 26.0–34.0)
MCHC: 32.4 g/dL (ref 30.0–36.0)
MCV: 95.6 fL (ref 80.0–100.0)
Platelets: 260 10*3/uL (ref 150–400)
RBC: 4.29 MIL/uL (ref 3.87–5.11)
RDW: 13.2 % (ref 11.5–15.5)
WBC: 7.6 10*3/uL (ref 4.0–10.5)
nRBC: 0 % (ref 0.0–0.2)

## 2018-05-16 MED ORDER — ONDANSETRON HCL 4 MG PO TABS: 4.0000 mg | ORAL_TABLET | Freq: Three times a day (TID) | ORAL | 0 refills | Status: DC | PRN

## 2018-05-16 NOTE — Discharge Summary (Signed)
[name] [name] VVO:[id number] DOB: 01-29-1936 DOA: 05/13/2018  PCP: [name], MD  Admit date: 05/13/2018  Discharge date: 05/16/2018  Admitted From: Home  Disposition:  Home   Recommendations for Outpatient Follow-up:   Follow up with PCP in 1-2 weeks  PCP Please obtain BMP/CBC, 2 view CXR in 1week,  (see Discharge instructions)   PCP Please follow up on the following pending results:    Home Health: None   Equipment/Devices: None  Consultations: None Discharge Condition: Fair   CODE STATUS: Full   Diet Recommendation:  Soft dairy free diet for the next 3 to 4 days then advance to regular consistency heart healthy diet as tolerated.  Diet Order            Diet full liquid Room service appropriate? Yes; Fluid consistency: Thin  Diet effective now               Chief Complaint  Patient presents with  . Nausea  . Emesis     Brief history of present illness from the day of admission and additional interim summary    [name] is a 83 y.o. female with history of hiatal hernia status post fundoplication, hypertension, hyperlipidemia, asthma, chronic pain syndrome who was admitted last month in December 1 week with nausea vomiting presents with similar complaints with persistent vomiting over the last 4 days.  Patient had come to the ER 2 days ago on May 12, 2017 and at that time CAT scan of the abdomen and pelvis was unremarkable.  Patient was discharged home after patient got better symptomatically.  After going back home patient started having again persistent nausea and vomiting denies any diarrhea.  Denies any blood in the vomitus.                                                                 Hospital Course    1. Intractable nausea vomiting -cause not clear.    Due to gastroenteritis from  eating bad salad at a restaurant recently, CT scan done few days ago unremarkable.  She was kept on IV fluids along with bowel rest with resolution of her symptoms, she is completely symptom-free, benign abdominal exam.  Of note patient does have chronic pain and was taking narcotics and abruptly stopped taking this which could have caused some withdrawal.  She was not interested in taking Suboxone.  Requested her to taper her narcotics gently rather than abrupt stoppage.  Currently electrolytes stable patient symptom-free benign exam.  Will discharge home with PCP follow-up.  2. Hypertension -continue home regimen. 3. History of asthma presently not wheezing. 4. Chronic pain syndrome on gabapentin Robaxin and used to be on oxycodone which patient does not want to take. See #1. 5. Hyperlipidemia on statins.   Discharge diagnosis  Principal Problem:   Nausea & vomiting Active Problems:   Essential hypertension   MGUS (monoclonal gammopathy of unknown significance)   Chronic pain syndrome   Chronic bronchitis (HCC)   CKD (chronic kidney disease) stage 3, GFR 30-59 ml/min (HCC)   Intractable nausea and vomiting    Discharge instructions    Discharge Instructions    Discharge instructions   Complete by:  As directed    Follow with Primary MD [name], MD in 7 days   Get CBC, CMP checked  by Primary MD  in 5-7 days   Activity: As tolerated with Full fall precautions use walker/cane & assistance as needed  Disposition Home    Diet: Soft dairy free diet for the next 3 to 4 days then advance to regular consistency heart healthy diet as tolerated.   Special Instructions: If you have smoked or chewed Tobacco  in the last 2 yrs please stop smoking, stop any regular Alcohol  and or any Recreational drug use.  On your next visit with your primary care physician please Get Medicines reviewed and adjusted.  Please request your Prim.MD to go over all Hospital Tests and  Procedure/Radiological results at the follow up, please get all Hospital records sent to your Prim MD by signing hospital release before you go home.  If you experience worsening of your admission symptoms, develop shortness of breath, life threatening emergency, suicidal or homicidal thoughts you must seek medical attention immediately by calling 911 or calling your MD immediately  if symptoms less severe.  You Must read complete instructions/literature along with all the possible adverse reactions/side effects for all the Medicines you take and that have been prescribed to you. Take any new Medicines after you have completely understood and accpet all the possible adverse reactions/side effects.   Increase activity slowly   Complete by:  As directed       Discharge Medications      Reactions   Ace Inhibitors Cough   Per Dr Melvyn Novas pulmonology 2013   Aspartame Diarrhea, Nausea And Vomiting   Atorvastatin Other (See Comments)   Muscle aches   Biaxin [clarithromycin] Other (See Comments)   Unknown reaction : PATIENT CAN TOLERATE Z-PAK.  Is written on patient's paper chart.   Daliresp [roflumilast] Nausea And Vomiting   Erythromycin Other (See Comments)   Unknown reaction: PATIENT CAN TOLERATE Z-PAK.  It is written on patient's paper chart.   Levofloxacin Nausea And Vomiting      Medication List    TAKE these medications   acetaminophen 500 MG tablet Commonly known as:  TYLENOL Take 1 tablet (500 mg total) by mouth every 6 (six) hours as needed. What changed:    how much to take  reasons to take this   albuterol 108 (90 Base) MCG/ACT inhaler Commonly known as:  PROAIR HFA Inhale 2 puffs into the lungs every 6 (six) hours as needed for wheezing or shortness of breath.   alendronate 70 MG tablet Commonly known as:  FOSAMAX Take with a full glass of water on an empty stomach. What changed:    how much to take  how to take this  when to take this   ALPRAZolam 0.5 MG  tablet Commonly known as:  XANAX Take 1/2 tablet 1- 2 x /day ONLY if needed for Anxiety Attack &  limit to 5 days /week to avoid addiction (Last till Mar 30) What changed:    how much to take  how to take this  when to take this  reasons to take this  additional instructions   amLODipine 10 MG tablet Commonly known as:  NORVASC TAKE 1 TABLET BY MOUTH AT BEDTIME   aspirin EC 81 MG tablet Take 81 mg by mouth at bedtime.   Coral Calcium 1000 (390 Ca) MG Tabs Take 1,000 mg by mouth 2 (two) times daily.   diclofenac sodium 1 % Gel Commonly known as:  VOLTAREN Apply 2 g topically 4 (four) times daily. What changed:    when to take this  reasons to take this   dicyclomine 10 MG capsule Commonly known as:  BENTYL TAKE 1 CAPSULE BY MOUTH EVERY 8 HOURS AS NEEDED FOR SPASM What changed:  See the new instructions.   gabapentin 300 MG capsule Commonly known as:  NEURONTIN TAKE 1 CAPSULE BY MOUTH THREE TIMES DAILY FOR  CHRONIC  PAIN. What changed:    how much to take  how to take this  when to take this  additional instructions   ipratropium-albuterol 0.5-2.5 (3) MG/3ML Soln Commonly known as:  DUONEB Take 3 mLs by nebulization every 6 (six) hours as needed (shortness of breath/wheezing).   ketoconazole 2 % cream Commonly known as:  NIZORAL APPLY ONE APPLICATION TOPICALLY TWO TIMES DAILY What changed:  See the new instructions.   losartan 50 MG tablet Commonly known as:  COZAAR Take 1 tablet (50 mg total) by mouth daily.   lubiprostone 8 MCG capsule Commonly known as:  AMITIZA Take 1 capsule (8 mcg total) by mouth 2 (two) times daily with a meal.   Magnesium 400 MG Tabs Take 400 mg by mouth daily.   meclizine 25 MG tablet Commonly known as:  ANTIVERT TAKE ONE-HALF TO ONE TABLET BY MOUTH UP TO THREE TIMES DAILY FOR MOTION SICKNESS/DIZZINESS What changed:    how much to take  how to take this  when to take this  reasons to take this  additional  instructions   metoprolol tartrate 50 MG tablet Commonly known as:  LOPRESSOR Take 25 mg by mouth at bedtime.   mometasone-formoterol 100-5 MCG/ACT Aero Commonly known as:  DULERA Inhale 2 puffs into the lungs every 12 (twelve) hours. What changed:    when to take this  reasons to take this   nitroGLYCERIN 0.4 MG SL tablet Commonly known as:  NITROSTAT Place 1 tablet (0.4 mg total) under the tongue every 5 (five) minutes as needed for chest pain.   omeprazole 20 MG capsule Commonly known as:  PRILOSEC Take 1 capsule daily for heartburn, Reflux & Nausea What changed:    how much to take  how to take this  when to take this  additional instructions   ondansetron 4 MG tablet Commonly known as:  ZOFRAN Take 1 tablet (4 mg total) by mouth every 8 (eight) hours as needed for nausea or vomiting.   ondansetron 8 MG disintegrating tablet Commonly known as:  ZOFRAN ODT 8mg  ODT q8 hours prn nausea What changed:    how much to take  how to take this  when to take this  reasons to take this  additional instructions   oxyCODONE-acetaminophen 5-325 MG tablet Commonly known as:  PERCOCET/ROXICET Take 1 tablet by mouth every 4 (four) hours as needed for severe pain.   polyethylene glycol powder powder Commonly known as:  GLYCOLAX/MIRALAX Take 17 g by mouth 2 (two) times daily. What changed:  when to take this   pravastatin 40 MG tablet Commonly known as:  PRAVACHOL TAKE  1 TABLET BY MOUTH DAILY What changed:  when to take this   promethazine 25 MG tablet Commonly known as:  PHENERGAN Take 1 tablet (25 mg total) by mouth every 6 (six) hours as needed for nausea or vomiting.   senna-docusate 8.6-50 MG tablet Commonly known as:  Senokot-S Take 2 tablets by mouth 2 (two) times daily. What changed:  how much to take   sucralfate 1 g tablet Commonly known as:  CARAFATE Take 1 tablet 2 to 4 x /day before meals for Heartburn What changed:    how much to  take  how to take this  when to take this  additional instructions   tamsulosin 0.4 MG Caps capsule Commonly known as:  FLOMAX Take 0.4 mg by mouth daily.   Vitamin D3 125 MCG (5000 UT) Caps Take 5,000 Units by mouth 2 (two) times daily.       Follow-up Information    [name], MD. Schedule an appointment as soon as possible for a visit in 1 week(s).   Specialty:  Internal Medicine Contact information: [address] [place] [address] [phone]        Nelva Bush, MD .   Specialty:  Cardiology Contact information: [place] [address] [phone]           Major procedures and Radiology Reports - PLEASE review detailed and final reports thoroughly  -         Dg Chest 2 View  Result Date: 05/12/2018 CLINICAL DATA:  Vomiting.  Headache.  Symptoms since last night. EXAM: CHEST - 2 VIEW COMPARISON:  04/12/2018 FINDINGS: The heart is mildly enlarged. Normal vascularity. Lungs are under aerated with linear atelectasis at the right lung base. No pneumothorax or pleural effusion. Status post vertebral augmentation and spinal instrumentation in the lower thoracic spine and lumbar spine. IMPRESSION: Cardiomegaly without decompensation. Electronically Signed   By: Marybelle Killings M.D.   On: 05/12/2018 11:00   Ct Abdomen Pelvis W Contrast  Result Date: 05/12/2018 CLINICAL DATA:  Pt c/o nausea and lower abdominal pain that radiates to back, starting last night. Surgery: Part. Hysterectomy; cholecystectomy; appendectomy EXAM: CT ABDOMEN AND PELVIS WITH CONTRAST TECHNIQUE: Multidetector CT imaging of the abdomen and pelvis was performed using the standard protocol following bolus administration of intravenous contrast. CONTRAST:  110mL OMNIPAQUE IOHEXOL 300 MG/ML  SOLN COMPARISON:  11/08/2017 FINDINGS: Lower chest: No acute abnormality. Hepatobiliary: 7 mm cyst posterior aspect of segment 4B, stable. No other liver masses or  lesions. Liver normal in overall size. Gallbladder surgically absent. There is intra and extrahepatic bile duct dilation. Common bile duct measures 14 mm. This is chronic and stable from the prior CT. Pancreas: Unremarkable. No pancreatic ductal dilatation or surrounding inflammatory changes. Spleen: Normal in size without focal abnormality. Adrenals/Urinary Tract: No adrenal masses. Kidneys normal in position with symmetric enhancement and excretion. Mild renal cortical thinning. 7 mm low-density lesion from the anterior upper pole the left kidney consistent with a cyst. Tiny low-density cortical lesion lateral midpole of the right kidney, also likely a cyst. No other renal masses, no stones and no hydronephrosis. Normal ureters. Normal bladder. Stomach/Bowel: Stable changes from a hiatal hernia repair. Stomach otherwise unremarkable. Small bowel and colon are normal in caliber. No wall thickening or inflammation. Vascular/Lymphatic: Aortic atherosclerosis. No enlarged abdominal or pelvic lymph nodes. Reproductive: Status post hysterectomy. No adnexal masses. Other: No abdominal wall hernia or abnormality. No abdominopelvic ascites. Musculoskeletal: Chronic compression fractures of T11,  T12, L1, L2, L3 and L4. All but the L1 fracture have been treated with prior vertebroplasty. Posterior fusion hardware at L1, L2 and L3. No acute fracture. No evidence of loosening of the orthopedic hardware. No osteoblastic or osteolytic lesions. There are changes of chronic avascular necrosis of the right femoral head. When compared to the prior CT, there has been no significant interval change. IMPRESSION: 1. No acute findings.  No findings to account abdominal pain. 2. Chronic findings include chronic intra and extrahepatic bile duct dilation, aortic atherosclerosis, and multiple vertebral fractures. Electronically Signed   By: Lajean Manes M.D.   On: 05/12/2018 16:06    Micro Results    No results found for this or any  previous visit (from the past 240 hour(s)).  Today   Subjective    Saia Derossett today has no headache,no chest abdominal pain,no new weakness tingling or numbness, feels much better wants to go home today.    Objective   Blood pressure (!) 148/85, pulse (!) 55, temperature 98.7 F (37.1 C), temperature source Oral, resp. rate 20, height 5\' 3"  (1.6 m), weight 73.5 kg, SpO2 96 %.   Intake/Output Summary (Last 24 hours) at 05/16/2018 0954 Last data filed at 05/16/2018 0400 Gross per 24 hour  Intake 3037.95 ml  Output -  Net 3037.95 ml    Exam  Awake Alert, Oriented x 3, No new F.N deficits, Normal affect [place].AT,PERRAL Supple Neck,No JVD, No cervical lymphadenopathy appriciated.  Symmetrical Chest wall movement, Good air movement bilaterally, CTAB RRR,No Gallops,Rubs or new Murmurs, No Parasternal Heave +ve B.Sounds, Abd Soft, Non tender, No organomegaly appriciated, No rebound -guarding or rigidity. No Cyanosis, Clubbing or edema, No new Rash or bruise   Data Review   CBC w Diff:  Lab Results  Component Value Date   WBC 7.6 05/16/2018   HGB 13.3 05/16/2018   HGB 14.0 02/19/2013   HCT 41.0 05/16/2018   HCT 41.1 02/19/2013   PLT 260 05/16/2018   PLT 456 (H) 02/19/2013   LYMPHOPCT 18 05/13/2018   LYMPHOPCT 24.5 02/19/2013   MONOPCT 7 05/13/2018   MONOPCT 8.0 02/19/2013   EOSPCT 0 05/13/2018   EOSPCT 1.8 02/19/2013   BASOPCT 0 05/13/2018   BASOPCT 0.3 02/19/2013    CMP:  Lab Results  Component Value Date   NA 141 05/16/2018   NA 139 02/19/2013   K 3.7 05/16/2018   K 2.8 (LL) 02/19/2013   CL 112 (H) 05/16/2018   CO2 22 05/16/2018   CO2 24 02/19/2013   BUN 5 (L) 05/16/2018   BUN 12.6 02/19/2013   CREATININE 0.78 05/16/2018   CREATININE 0.79 04/17/2018   CREATININE 1.0 02/19/2013   PROT 5.7 (L) 05/15/2018   PROT 8.0 02/19/2013   ALBUMIN 3.0 (L) 05/15/2018   ALBUMIN 3.9 02/19/2013   BILITOT 1.2 05/15/2018   BILITOT 0.74 02/19/2013   ALKPHOS 37 (L)  05/15/2018   ALKPHOS 87 02/19/2013   AST 18 05/15/2018   AST 12 02/19/2013   ALT 18 05/15/2018   ALT 11 02/19/2013  .   Total Time in preparing paper work, data evaluation and todays exam - 32 minutes  Lala Lund M.D on 05/16/2018 at 9:54 AM  [facility]   Office  [phone]

## 2018-05-16 NOTE — Discharge Instructions (Signed)
Follow with Primary MD [name], MD in 7 days   Get CBC, CMP checked  by Primary MD  in 5-7 days   Activity: As tolerated with Full fall precautions use walker/cane & assistance as needed  Disposition Home    Diet: Soft dairy free diet for the next 3 to 4 days then advance to regular consistency heart healthy diet as tolerated.   Special Instructions: If you have smoked or chewed Tobacco  in the last 2 yrs please stop smoking, stop any regular Alcohol  and or any Recreational drug use.  On your next visit with your primary care physician please Get Medicines reviewed and adjusted.  Please request your Prim.MD to go over all Hospital Tests and Procedure/Radiological results at the follow up, please get all Hospital records sent to your Prim MD by signing hospital release before you go home.  If you experience worsening of your admission symptoms, develop shortness of breath, life threatening emergency, suicidal or homicidal thoughts you must seek medical attention immediately by calling 911 or calling your MD immediately  if symptoms less severe.  You Must read complete instructions/literature along with all the possible adverse reactions/side effects for all the Medicines you take and that have been prescribed to you. Take any new Medicines after you have completely understood and accpet all the possible adverse reactions/side effects.

## 2018-05-16 NOTE — Progress Notes (Signed)
Nsg Discharge Note  Admit Date:  05/13/2018 Discharge date: 05/16/2018   [name] [name] to be D/C'd Home per MD order.  AVS completed.  Copy for chart, and copy for patient signed, and dated. Patient/caregiver able to verbalize understanding.  Discharge Medication:    Reactions   Ace Inhibitors Cough   Per Dr Melvyn Novas pulmonology 2013   Aspartame Diarrhea, Nausea And Vomiting   Atorvastatin Other (See Comments)   Muscle aches   Biaxin [clarithromycin] Other (See Comments)   Unknown reaction : PATIENT CAN TOLERATE Z-PAK.  Is written on patient's paper chart.   Daliresp [roflumilast] Nausea And Vomiting   Erythromycin Other (See Comments)   Unknown reaction: PATIENT CAN TOLERATE Z-PAK.  It is written on patient's paper chart.   Levofloxacin Nausea And Vomiting      Medication List    TAKE these medications   acetaminophen 500 MG tablet Commonly known as:  TYLENOL Take 1 tablet (500 mg total) by mouth every 6 (six) hours as needed. What changed:    how much to take  reasons to take this   albuterol 108 (90 Base) MCG/ACT inhaler Commonly known as:  PROAIR HFA Inhale 2 puffs into the lungs every 6 (six) hours as needed for wheezing or shortness of breath.   alendronate 70 MG tablet Commonly known as:  FOSAMAX Take with a full glass of water on an empty stomach. What changed:    how much to take  how to take this  when to take this   ALPRAZolam 0.5 MG tablet Commonly known as:  XANAX Take 1/2 tablet 1- 2 x /day ONLY if needed for Anxiety Attack &  limit to 5 days /week to avoid addiction (Last till Mar 30) What changed:    how much to take  how to take this  when to take this  reasons to take this  additional instructions   amLODipine 10 MG tablet Commonly known as:  NORVASC TAKE 1 TABLET BY MOUTH AT BEDTIME   aspirin EC 81 MG tablet Take 81 mg by mouth at bedtime.   Coral Calcium 1000 (390 Ca) MG Tabs Take 1,000 mg by mouth 2 (two) times daily.    diclofenac sodium 1 % Gel Commonly known as:  VOLTAREN Apply 2 g topically 4 (four) times daily. What changed:    when to take this  reasons to take this   dicyclomine 10 MG capsule Commonly known as:  BENTYL TAKE 1 CAPSULE BY MOUTH EVERY 8 HOURS AS NEEDED FOR SPASM What changed:  See the new instructions.   gabapentin 300 MG capsule Commonly known as:  NEURONTIN TAKE 1 CAPSULE BY MOUTH THREE TIMES DAILY FOR  CHRONIC  PAIN. What changed:    how much to take  how to take this  when to take this  additional instructions   ipratropium-albuterol 0.5-2.5 (3) MG/3ML Soln Commonly known as:  DUONEB Take 3 mLs by nebulization every 6 (six) hours as needed (shortness of breath/wheezing).   ketoconazole 2 % cream Commonly known as:  NIZORAL APPLY ONE APPLICATION TOPICALLY TWO TIMES DAILY What changed:  See the new instructions.   losartan 50 MG tablet Commonly known as:  COZAAR Take 1 tablet (50 mg total) by mouth daily.   lubiprostone 8 MCG capsule Commonly known as:  AMITIZA Take 1 capsule (8 mcg total) by mouth 2 (two) times daily with a meal.   Magnesium 400 MG Tabs Take 400 mg by mouth daily.   meclizine 25  MG tablet Commonly known as:  ANTIVERT TAKE ONE-HALF TO ONE TABLET BY MOUTH UP TO THREE TIMES DAILY FOR MOTION SICKNESS/DIZZINESS What changed:    how much to take  how to take this  when to take this  reasons to take this  additional instructions   metoprolol tartrate 50 MG tablet Commonly known as:  LOPRESSOR Take 25 mg by mouth at bedtime.   mometasone-formoterol 100-5 MCG/ACT Aero Commonly known as:  DULERA Inhale 2 puffs into the lungs every 12 (twelve) hours. What changed:    when to take this  reasons to take this   nitroGLYCERIN 0.4 MG SL tablet Commonly known as:  NITROSTAT Place 1 tablet (0.4 mg total) under the tongue every 5 (five) minutes as needed for chest pain.   omeprazole 20 MG capsule Commonly known as:  PRILOSEC Take  1 capsule daily for heartburn, Reflux & Nausea What changed:    how much to take  how to take this  when to take this  additional instructions   ondansetron 4 MG tablet Commonly known as:  ZOFRAN Take 1 tablet (4 mg total) by mouth every 8 (eight) hours as needed for nausea or vomiting.   ondansetron 8 MG disintegrating tablet Commonly known as:  ZOFRAN ODT 8mg  ODT q8 hours prn nausea What changed:    how much to take  how to take this  when to take this  reasons to take this  additional instructions   oxyCODONE-acetaminophen 5-325 MG tablet Commonly known as:  PERCOCET/ROXICET Take 1 tablet by mouth every 4 (four) hours as needed for severe pain.   polyethylene glycol powder powder Commonly known as:  GLYCOLAX/MIRALAX Take 17 g by mouth 2 (two) times daily. What changed:  when to take this   pravastatin 40 MG tablet Commonly known as:  PRAVACHOL TAKE 1 TABLET BY MOUTH DAILY What changed:  when to take this   promethazine 25 MG tablet Commonly known as:  PHENERGAN Take 1 tablet (25 mg total) by mouth every 6 (six) hours as needed for nausea or vomiting.   senna-docusate 8.6-50 MG tablet Commonly known as:  Senokot-S Take 2 tablets by mouth 2 (two) times daily. What changed:  how much to take   sucralfate 1 g tablet Commonly known as:  CARAFATE Take 1 tablet 2 to 4 x /day before meals for Heartburn What changed:    how much to take  how to take this  when to take this  additional instructions   tamsulosin 0.4 MG Caps capsule Commonly known as:  FLOMAX Take 0.4 mg by mouth daily.   Vitamin D3 125 MCG (5000 UT) Caps Take 5,000 Units by mouth 2 (two) times daily.       Discharge Assessment: Vitals:   05/16/18 0334 05/16/18 0849  BP: (!) 146/63 (!) 148/85  Pulse: (!) 55   Resp: 20   Temp: 98.7 F (37.1 C)   SpO2: 96%    Skin clean, dry and intact without evidence of skin break down, no evidence of skin tears noted. IV catheter  discontinued intact. Site without signs and symptoms of complications - no redness or edema noted at insertion site, patient denies c/o pain - only slight tenderness at site.  Dressing with slight pressure applied.  D/c Instructions-Education: Discharge instructions given to patient/family with verbalized understanding. D/c education completed with patient/family including follow up instructions, medication list, d/c activities limitations if indicated, with other d/c instructions as indicated by MD - patient able to  verbalize understanding, all questions fully answered. Patient instructed to return to ED, call 911, or call MD for any changes in condition.  Patient escorted via [place], and D/C home via private auto.  Hiram Comber, RN 05/16/2018 10:49 AM

## 2018-05-17 ENCOUNTER — Encounter: Payer: Self-pay | Admitting: Internal Medicine

## 2018-05-17 ENCOUNTER — Ambulatory Visit: Payer: PPO | Admitting: Rheumatology

## 2018-05-17 ENCOUNTER — Ambulatory Visit (INDEPENDENT_AMBULATORY_CARE_PROVIDER_SITE_OTHER): Payer: PPO | Admitting: Internal Medicine

## 2018-05-17 VITALS — BP 126/74 | HR 88 | Temp 97.8°F | Resp 16 | Ht 63.0 in | Wt 158.2 lb

## 2018-05-17 DIAGNOSIS — G894 Chronic pain syndrome: Secondary | ICD-10-CM

## 2018-05-17 DIAGNOSIS — D472 Monoclonal gammopathy: Secondary | ICD-10-CM | POA: Diagnosis not present

## 2018-05-17 DIAGNOSIS — N183 Chronic kidney disease, stage 3 unspecified: Secondary | ICD-10-CM

## 2018-05-17 DIAGNOSIS — Z79899 Other long term (current) drug therapy: Secondary | ICD-10-CM | POA: Diagnosis not present

## 2018-05-17 DIAGNOSIS — J42 Unspecified chronic bronchitis: Secondary | ICD-10-CM | POA: Diagnosis not present

## 2018-05-17 DIAGNOSIS — K529 Noninfective gastroenteritis and colitis, unspecified: Secondary | ICD-10-CM

## 2018-05-17 DIAGNOSIS — I10 Essential (primary) hypertension: Secondary | ICD-10-CM | POA: Diagnosis not present

## 2018-05-17 DIAGNOSIS — R112 Nausea with vomiting, unspecified: Secondary | ICD-10-CM

## 2018-05-17 DIAGNOSIS — E876 Hypokalemia: Secondary | ICD-10-CM

## 2018-05-17 DIAGNOSIS — K219 Gastro-esophageal reflux disease without esophagitis: Secondary | ICD-10-CM

## 2018-05-17 NOTE — Patient Instructions (Signed)

## 2018-05-17 NOTE — Progress Notes (Signed)
Foot of [name] [name]     This very nice 83 y.o. MWF was admitted to the hospital on 05/13/2018 and patient was discharged from the hospital on  05/16/2018. The patient now presents for follow up for transition from recent hospitalization.  The day after discharge our clinical staff contacted the patient to assure stability and schedule a follow up appointment. The discharge summary, medications and diagnostic test results were reviewed before meeting with the patient. The patient was admitted for:     Intractable Nausea & vomiting   Gastroenteritis   Hypokalemia   Essential hypertension   MGUS (monoclonal gammopathy of unknown significance)   Chronic pain syndrome   Chronic bronchitis (HCC)   CKD (chronic kidney disease) stage 3, GFR 30-59 ml/min (HCC)     Patient was admitted after a 2sd ER visit returning with intractable N/V and was given IVF, bowel rest and as she clinically improved and diet was advanced from liquids to solids which she tolerated, she was ultimately felt stable for return home. Initial potassium was very low at 2.8 and was corrected with IVF. CXR  & Abd CT scan was negative. Patient also had decided to stop her chronic narcotic meds and it was speculated this may have been contributory to her N/V.      Hospitalization discharge instructions and medications are reconciled with the patient & spouse.      Patient is also followed with Hypertension, Hyperlipidemia, Pre-Diabetes and Vitamin D Deficiency.      Patient is treated for HTN & BP has been controlled at home. Today's BP: 126/74. Patient has had no complaints of any cardiac type chest pain, palpitations, dyspnea/orthopnea/PND, dizziness, claudication, or dependent edema.     Hyperlipidemia is controlled with diet & meds. Patient denies myalgias or other med SE's. Last Lipids were at goal: Lab Results  Component Value Date   CHOL 131 03/02/2018   HDL 65 03/02/2018   LDLCALC 46 03/02/2018   TRIG 116  03/02/2018   CHOLHDL 2.0 03/02/2018      Also, the patient has history of PreDiabetes  (A1c 6.1% / 2013)  and has had no symptoms of reactive hypoglycemia, diabetic polys, paresthesias or visual blurring.  Last A1c was not at goal; Lab Results  Component Value Date   HGBA1C 6.0 (H) 03/02/2018      Further, the patient also has history of Vitamin D Deficiency  ("25" / 2008)  and supplements vitamin D without any suspected side-effects. Last vitamin D was at goal: Lab Results  Component Value Date   VD25OH 74 03/02/2018   Current Outpatient Medications on File Prior to Visit  Medication Sig  . acetaminophen (TYLENOL) 500 MG tablet Take 1 tablet (500 mg total) by mouth every 6 (six) hours as needed. (Patient taking differently: Take 500-1,000 mg by mouth every 6 (six) hours as needed (pain). )  . albuterol (PROAIR HFA) 108 (90 Base) MCG/ACT inhaler Inhale 2 puffs into the lungs every 6 (six) hours as needed for wheezing or shortness of breath.  [name] alendronate (FOSAMAX) 70 MG tablet Take with a full glass of water on an empty stomach. (Patient taking differently: Take 70 mg by mouth every Sunday. Take with a full glass of water on an empty stomach.)  . ALPRAZolam (XANAX) 0.5 MG tablet Take 1/2 tablet 1- 2 x /day ONLY if needed for Anxiety Attack &  limit to 5 days /week to avoid addiction (Last till Mar 30) (Patient taking differently: Take 0.25 mg by  mouth at bedtime as needed for sleep. )  . amLODipine (NORVASC) 10 MG tablet TAKE 1 TABLET BY MOUTH AT BEDTIME (Patient taking differently: Take 10 mg by mouth at bedtime. )  . aspirin EC 81 MG tablet Take 81 mg by mouth at bedtime.   . Cholecalciferol (VITAMIN D3) 5000 units CAPS Take 5,000 Units by mouth 2 (two) times daily.   [name] Coral Calcium 1000 (390 Ca) MG TABS Take 1,000 mg by mouth 2 (two) times daily.  . diclofenac sodium (VOLTAREN) 1 % GEL Apply 2 g topically 4 (four) times daily. (Patient taking differently: Apply 2 g topically 4 (four)  times daily as needed (pain). )  . dicyclomine (BENTYL) 10 MG capsule TAKE 1 CAPSULE BY MOUTH EVERY 8 HOURS AS NEEDED FOR SPASM (Patient taking differently: Take 10 mg by mouth 3 (three) times daily as needed for spasms. )  . gabapentin (NEURONTIN) 300 MG capsule TAKE 1 CAPSULE BY MOUTH THREE TIMES DAILY FOR  CHRONIC  PAIN. (Patient taking differently: Take 300 mg by mouth 2 (two) times daily. )  . ipratropium-albuterol (DUONEB) 0.5-2.5 (3) MG/3ML SOLN Take 3 mLs by nebulization every 6 (six) hours as needed (shortness of breath/wheezing).   [name] ketoconazole (NIZORAL) 2 % cream  APPLY ONE APPLICATION TOPICALLY TWO TIMES DAILY (Patient taking differently: Apply 1 application topically 2 (two) times daily as needed for irritation. )  . losartan (COZAAR) 50 MG tablet Take 1 tablet (50 mg total) by mouth daily.  [name] lubiprostone (AMITIZA) 8 MCG capsule Take 1 capsule (8 mcg total) by mouth 2 (two) times daily with a meal.  . Magnesium 400 MG TABS Take 400 mg by mouth daily.  . meclizine (ANTIVERT) 25 MG tablet TAKE ONE-HALF TO ONE TABLET BY MOUTH UP TO THREE TIMES DAILY FOR MOTION SICKNESS/DIZZINESS (Patient taking differently: Take 25 mg by mouth 3 (three) times daily as needed for dizziness (motion sickness). )  . metoprolol tartrate (LOPRESSOR) 50 MG tablet Take 25 mg by mouth at bedtime.   . mometasone-formoterol (DULERA) 100-5 MCG/ACT AERO Inhale 2 puffs into the lungs every 12 (twelve) hours. (Patient taking differently: Inhale 2 puffs into the lungs every 12 (twelve) hours as needed for wheezing or shortness of breath. )  . nitroGLYCERIN (NITROSTAT) 0.4 MG SL tablet Place 1 tablet (0.4 mg total) under the tongue every 5 (five) minutes as needed for chest pain.  [name] omeprazole (PRILOSEC) 20 MG capsule Take 1 capsule daily for heartburn, Reflux & Nausea (Patient taking differently: Take 20 mg by mouth daily. For heartburn, reflux and/or nausea)  . ondansetron (ZOFRAN ODT) 8 MG disintegrating tablet 8mg  ODT  q8 hours prn nausea (Patient taking differently: Take 8 mg by mouth every 8 (eight) hours as needed for nausea or vomiting. )  . ondansetron (ZOFRAN) 4 MG tablet Take 1 tablet (4 mg total) by mouth every 8 (eight) hours as needed for nausea or vomiting.  . polyethylene glycol powder (GLYCOLAX/MIRALAX) powder Take 17 g by mouth 2 (two) times daily. (Patient taking differently: Take 17 g by mouth daily. )  . pravastatin (PRAVACHOL) 40 MG tablet TAKE 1 TABLET BY MOUTH DAILY (Patient taking differently: Take 40 mg by mouth at bedtime. )  . promethazine (PHENERGAN) 25 MG tablet Take 1 tablet (25 mg total) by mouth every 6 (six) hours as needed for nausea or vomiting.  . senna-docusate (SENOKOT-S) 8.6-50 MG tablet Take 2 tablets by mouth 2 (two) times daily. (Patient taking differently: Take 1 tablet by mouth  2 (two) times daily. )  . sucralfate (CARAFATE) 1 g tablet Take 1 tablet 2 to 4 x /day before meals for Heartburn (Patient taking differently: Take 1 g by mouth 3 (three) times daily before meals. )  . tamsulosin (FLOMAX) 0.4 MG CAPS capsule Take 0.4 mg by mouth daily.   No current facility-administered medications on file prior to visit.    Allergies  Allergen Reactions  . Ace Inhibitors Cough    Per Dr Melvyn Novas pulmonology 2013  . Aspartame Diarrhea and Nausea And Vomiting  . Atorvastatin Other (See Comments)    Muscle aches  . Biaxin [Clarithromycin] Other (See Comments)    Unknown reaction : PATIENT CAN TOLERATE Z-PAK.  Is written on patient's paper chart.  [name] [Roflumilast] Nausea And Vomiting  . Erythromycin Other (See Comments)    Unknown reaction: PATIENT CAN TOLERATE Z-PAK.  It is written on patient's paper chart.  . Levofloxacin Nausea And Vomiting   PMHx:   Past Medical History:  Diagnosis Date  . Anxiety    takes Xanax daily as needed  . Asthma    Albuterol daily as needed  . Blood transfusion without reported diagnosis   . CAD (coronary artery disease)    LHC 2003  with 40% pLAD, 30% mLAD, 100% D1 (moderate vessel), 100%  D2 (small vessel), 95% mid small OM2.  Pt had PTCA to D1;  D2 and OM2 small vessels and tx medically;  Last Myoview (2012): anterior infarct seen with scar, no ischemia. EF normal. Med Rx // Myoview (12/14):  Low risk; ant scar w/ peri-infarct ischemia; EF 55% with ant AK // Myoview 5/16: EF 55-65, ant, apical scar, no ischemia   . Cataract   . Chronic back pain    HNP, DDD, spinal stenosis, vertebral compression fractures.   . Chronic nausea    takes Zofran daily as needed.  normal gastric emptying study 03/2013  . CKD (chronic kidney disease), stage III ([place]) 09/27/2017  . Constipation    felt to be functional. takes Senokot and Miralax daily.  treated with Linzess in past.   . DIVERTICULOSIS, COLON 08/28/2008   Qualifier: Diagnosis of  By: [name], [name], [name]    . Elevated LFTs 2015   probably med induced DILI, from Augmentin vs Pravastatin  . GERD (gastroesophageal reflux disease)    hx of esophageal stricture   . Hiatal hernia    Paraesophageal hernias. s/p fundoplications in 5277 and 04/2013  . History of bronchitis several of yrs ago  . History of colon polyps 03/2009, 03/2011   adenomatous colon polyps, no high grade dysplasia.   [name] History of vertigo    no meds  . Hyperlipidemia    takes Pravastatin daily  . Hypertension    takes Amlodipine,Metoprolol,and Losartan daily  . Nausea 03/2016  . Osteoporosis   . PONV (postoperative nausea and vomiting)    yrs ago  . Slow urinary stream    takes Rapaflo daily  . TIA (transient ischemic attack) 1995   no residual effects noted   Immunization History  Administered Date(s) Administered  . DT 05/06/2014  . Influenza Split 02/07/2011  . Influenza Whole 02/09/2009, 01/19/2010, 02/13/2012  . Influenza, High Dose Seasonal PF 03/22/2013, 01/15/2014, 02/27/2015, 01/14/2016, 01/18/2017  . Pneumococcal Conjugate-13 05/06/2014  . Pneumococcal Polysaccharide-23 02/09/2011  .  Pneumococcal-Unspecified 02/06/2010, 02/09/2012  . Td 05/10/2003   Past Surgical History:  Procedure Laterality Date  . APPENDECTOMY     patient unsure of date  . BACK  SURGERY    . BLADDER REPAIR    . CHOLECYSTECTOMY     Patient unsure of date.  . CORONARY ANGIOPLASTY  11/16/2001   1 stent  . ESOPHAGEAL MANOMETRY N/A 03/25/2013   Procedure: ESOPHAGEAL MANOMETRY (EM);  Surgeon: Inda Castle, MD;  Location: [facility] ENDOSCOPY;  Service: Endoscopy;  Laterality: N/A;  . ESOPHAGOGASTRODUODENOSCOPY N/A 03/15/2013   Procedure: ESOPHAGOGASTRODUODENOSCOPY (EGD);  Surgeon: Jerene Bears, MD;  Location: [facility] ENDOSCOPY;  Service: Endoscopy;  Laterality: N/A;  . ESOPHAGOGASTRODUODENOSCOPY N/A 12/25/2015   Procedure: ESOPHAGOGASTRODUODENOSCOPY (EGD);  Surgeon: Doran Stabler, MD;  Location: [facility] ENDOSCOPY;  Service: Endoscopy;  Laterality: N/A;  . EYE SURGERY  11/2000   bilateral cataracts with lens implant  . FIXATION KYPHOPLASTY LUMBAR SPINE  X 2   "L3-4"  . HEMORRHOID SURGERY    . HIATAL HERNIA REPAIR  06/03/2011   Procedure: LAPAROSCOPIC REPAIR OF HIATAL HERNIA;  Surgeon: Adin Hector, MD;  Location: [facility] ORS;  Service: General;  Laterality: N/A;  . HIATAL HERNIA REPAIR N/A 04/24/2013   Procedure: LAPAROSCOPIC  REPAIR RECURRENT PARASOPHAGEAL HIATAL HERNIA WITH FUNDOPLICATION;  Surgeon: Adin Hector, MD;  Location: [facility];  Service: General;  Laterality: N/A;  . INSERTION OF MESH N/A 04/24/2013   Procedure: INSERTION OF MESH;  Surgeon: Adin Hector, MD;  Location: [facility];  Service: General;  Laterality: N/A;  . IR KYPHO THORACIC WITH BONE BIOPSY  09/29/2017  . IR KYPHO THORACIC WITH BONE BIOPSY  11/03/2017  . LAPAROSCOPIC LYSIS OF ADHESIONS N/A 04/24/2013   Procedure: LAPAROSCOPIC LYSIS OF ADHESIONS;  Surgeon: Adin Hector, MD;  Location: [facility];  Service: General;  Laterality: N/A;  . LAPAROSCOPIC NISSEN FUNDOPLICATION  9/48/5462   Procedure: LAPAROSCOPIC NISSEN FUNDOPLICATION;  Surgeon:  Adin Hector, MD;  Location: [facility];  Service: General;  Laterality: N/A;  . LUMBAR FUSION  12/07/2015   Right L2-3 facetectomy, posterolateral fusion L2-3 with pedicle screws, rods, local bone graft, Vivigen cancellous chips. Fusion extended to the L1 level with pedicle screws and rods  . LUMBAR LAMINECTOMY/DECOMPRESSION MICRODISCECTOMY Right 06/26/2012   Procedure: LUMBAR LAMINECTOMY/DECOMPRESSION MICRODISCECTOMY;  Surgeon: Jessy Oto, MD;  Location: [place];  Service: Orthopedics;  Laterality: Right;  RIGHT L4-5 MICRODISCECTOMY  . LUMBAR LAMINECTOMY/DECOMPRESSION MICRODISCECTOMY N/A 05/29/2015   Procedure: RIGHT L2-3 MICRODISCECTOMY;  Surgeon: Jessy Oto, MD;  Location: [place];  Service: Orthopedics;  Laterality: N/A;  . TOTAL ABDOMINAL HYSTERECTOMY  1975   partial  . UPPER GASTROINTESTINAL ENDOSCOPY     FHx:    Reviewed / unchanged  SHx:    Reviewed / unchanged  Systems Review:  Constitutional: Denies fever, chills, wt changes, headaches, insomnia, fatigue, night sweats, change in appetite. Eyes: Denies redness, blurred vision, diplopia, discharge, itchy, watery eyes.  ENT: Denies discharge, congestion, post nasal drip, epistaxis, sore throat, earache, hearing loss, dental pain, tinnitus, vertigo, sinus pain, snoring.  CV: Denies chest pain, palpitations, irregular heartbeat, syncope, dyspnea, diaphoresis, orthopnea, PND, claudication or edema. Respiratory: denies cough, dyspnea, DOE, pleurisy, hoarseness, laryngitis, wheezing.  Gastrointestinal: Denies dysphagia, odynophagia, heartburn, reflux, water brash, abdominal pain or cramps, nausea, vomiting, bloating, diarrhea, constipation, hematemesis, melena, hematochezia  or hemorrhoids. Genitourinary: Denies dysuria, frequency, urgency, nocturia, hesitancy, discharge, hematuria or flank pain. Musculoskeletal: Denies arthralgias, myalgias, stiffness, jt. swelling, pain, limping or strain/sprain.  Skin: Denies pruritus, rash, hives,  warts, acne, eczema or change in skin lesion(s). Neuro: No weakness, tremor, incoordination, spasms, paresthesia or pain. Psychiatric: Denies confusion, memory loss or  sensory loss. Endo: Denies change in weight, skin or hair change.  Heme/Lymph: No excessive bleeding, bruising or enlarged lymph nodes.  Physical Exam  BP 126/74   Pulse 88   Temp 97.8 F (36.6 C)   Resp 16   Ht 5\' 3"  (1.6 m)   Wt 158 lb 3.2 oz (71.8 kg)   BMI 28.02 kg/m   Appears well nourished, well groomed  and in no distress.  Eyes: PERRLA, EOMs, conjunctiva no swelling or erythema. Sinuses: No frontal/maxillary tenderness ENT/Mouth: EAC's clear, TM's nl w/o erythema, bulging. Nares clear w/o erythema, swelling, exudates. Oropharynx clear without erythema or exudates. Oral hygiene is good. Tongue normal, non obstructing. Hearing intact.  Neck: Supple. Thyroid nl. Car 2+/2+ without bruits, nodes or JVD. Chest: Respirations nl with BS clear & equal w/o rales, rhonchi, wheezing or stridor.  Cor: Heart sounds normal w/ regular rate and rhythm without sig. murmurs, gallops, clicks or rubs. Peripheral pulses normal and equal  without edema.  Abdomen: Soft & bowel sounds normal. Non-tender w/o guarding, rebound, hernias, masses or organomegaly.  Lymphatics: Unremarkable.  Musculoskeletal: Full ROM all peripheral extremities, joint stability, 5/5 strength and normal gait.  Skin: Warm, dry without exposed rashes, lesions or ecchymosis apparent.  Neuro: Cranial nerves intact, reflexes equal bilaterally. Sensory-motor testing grossly intact. Tendon reflexes grossly intact.  Pysch: Alert & oriented x 3.  Insight and judgement nl & appropriate. No ideations.  Assessment and Plan:  1. Intractable vomiting with nausea, unspecified vomiting type  - CBC with Differential/Platelet  2. Gastroenteritis  - CBC with Differential/Platelet  3. Gastroesophageal reflux disease, esophagitis presence not specified  - CBC with  Differential/Platelet  4. Hypokalemia  - COMPLETE METABOLIC PANEL WITH GFR  5. Essential hypertension  - Continue medication, monitor blood pressure at home.  - Continue DASH diet. Reminder to go to the ER if any CP,  SOB, nausea, dizziness, severe HA, changes vision/speech.  - CBC with Differential/Platelet - COMPLETE METABOLIC PANEL WITH GFR  6. MGUS (monoclonal gammopathy of unknown significance)  7. Chronic pain syndrome  8. Chronic bronchitis, unspecified chronic bronchitis type ([place])  9. CKD (chronic kidney disease) stage 3, GFR 30-59 ml/min (HCC)  - COMPLETE METABOLIC PANEL WITH GFR  10. Medication management                  - CBC with Differential/Platelet - COMPLETE METABOLIC PANEL WITH GFR       Discussed  regular exercise, BP monitoring, weight control to achieve/maintain BMI less than 25 and discussed meds and SE's. Recommended labs to assess and monitor clinical status with further disposition pending results of labs. Over 30 minutes of exam, counseling, chart review was performed.

## 2018-05-18 ENCOUNTER — Other Ambulatory Visit: Payer: Self-pay

## 2018-05-18 LAB — CBC WITH DIFFERENTIAL/PLATELET
Absolute Monocytes: 1091 cells/uL — ABNORMAL HIGH (ref 200–950)
Basophils Absolute: 30 cells/uL (ref 0–200)
Basophils Relative: 0.3 %
Eosinophils Absolute: 202 cells/uL (ref 15–500)
Eosinophils Relative: 2 %
HCT: 43 % (ref 35.0–45.0)
Hemoglobin: 14.8 g/dL (ref 11.7–15.5)
Lymphs Abs: 1879 cells/uL (ref 850–3900)
MCH: 32 pg (ref 27.0–33.0)
MCHC: 34.4 g/dL (ref 32.0–36.0)
MCV: 92.9 fL (ref 80.0–100.0)
MONOS PCT: 10.8 %
MPV: 11.1 fL (ref 7.5–12.5)
Neutro Abs: 6898 cells/uL (ref 1500–7800)
Neutrophils Relative %: 68.3 %
Platelets: 310 10*3/uL (ref 140–400)
RBC: 4.63 10*6/uL (ref 3.80–5.10)
RDW: 12.7 % (ref 11.0–15.0)
TOTAL LYMPHOCYTE: 18.6 %
WBC: 10.1 10*3/uL (ref 3.8–10.8)

## 2018-05-18 LAB — COMPLETE METABOLIC PANEL WITH GFR
AG Ratio: 1.6 (calc) (ref 1.0–2.5)
ALT: 15 U/L (ref 6–29)
AST: 17 U/L (ref 10–35)
Albumin: 4.3 g/dL (ref 3.6–5.1)
Alkaline phosphatase (APISO): 61 U/L (ref 33–130)
BUN/Creatinine Ratio: 13 (calc) (ref 6–22)
BUN: 12 mg/dL (ref 7–25)
CO2: 26 mmol/L (ref 20–32)
Calcium: 10.2 mg/dL (ref 8.6–10.4)
Chloride: 104 mmol/L (ref 98–110)
Creat: 0.92 mg/dL — ABNORMAL HIGH (ref 0.60–0.88)
GFR, EST AFRICAN AMERICAN: 68 mL/min/{1.73_m2} (ref >=60)
GFR, EST NON AFRICAN AMERICAN: 59 mL/min/{1.73_m2} — AB (ref >=60)
GLUCOSE: 96 mg/dL (ref 65–99)
Globulin: 2.7 g/dL (calc) (ref 1.9–3.7)
Potassium: 4.4 mmol/L (ref 3.5–5.3)
Sodium: 144 mmol/L (ref 135–146)
Total Bilirubin: 0.7 mg/dL (ref 0.2–1.2)
Total Protein: 7 g/dL (ref 6.1–8.1)

## 2018-05-18 NOTE — Patient Outreach (Signed)
[facility]) Care Management  [place]  05/18/2018  [name] [name] 09/23/1935 [id number]  Reason for call: Post discharge medication review   Unsuccessful telephone call attempt # 1 to patient.   Unable to leave message-no answer.  Plan:  I will make another outreach attempt to patient within 3-4 business days.  Joetta Manners, PharmD Clinical Pharmacist [place] [phone]

## 2018-05-20 ENCOUNTER — Encounter: Payer: Self-pay | Admitting: Internal Medicine

## 2018-05-21 DIAGNOSIS — G894 Chronic pain syndrome: Secondary | ICD-10-CM | POA: Diagnosis not present

## 2018-05-21 DIAGNOSIS — R110 Nausea: Secondary | ICD-10-CM | POA: Diagnosis not present

## 2018-05-21 DIAGNOSIS — Z79891 Long term (current) use of opiate analgesic: Secondary | ICD-10-CM | POA: Diagnosis not present

## 2018-05-21 DIAGNOSIS — M4726 Other spondylosis with radiculopathy, lumbar region: Secondary | ICD-10-CM | POA: Diagnosis not present

## 2018-05-23 ENCOUNTER — Other Ambulatory Visit: Payer: Self-pay

## 2018-05-23 ENCOUNTER — Other Ambulatory Visit: Payer: Self-pay | Admitting: Internal Medicine

## 2018-05-23 NOTE — Patient Outreach (Addendum)
[facility] Care Management  [place]  05/23/2018  [name] [name] January 26, 1936 [id number]   Reason for call: Post discharge medication reconciliation  Unsuccessful telephone call attempt #2 to patient.   Unable to leave message because she did not have voice mail.  Plan:  I will make another outreach attempt to patient within 3-4 business days.  Denver Faster PharmD Candidate Class of 2020  [employer], [place] [phone]

## 2018-05-24 ENCOUNTER — Other Ambulatory Visit: Payer: Self-pay | Admitting: Internal Medicine

## 2018-05-24 MED ORDER — FLUTICASONE-SALMETEROL 250-50 MCG/DOSE IN AEPB: INHALATION_SPRAY | RESPIRATORY_TRACT | 3 refills | Status: DC

## 2018-05-28 ENCOUNTER — Other Ambulatory Visit: Payer: Self-pay | Admitting: Pharmacist

## 2018-05-28 ENCOUNTER — Other Ambulatory Visit: Payer: Self-pay | Admitting: Internal Medicine

## 2018-05-28 NOTE — Patient Outreach (Addendum)
[facility]) Care Management  05/28/2018  [name] [name] 1935-08-19 [id number]  Reason for call: Post discharge medication reconciliation  Unsuccessful telephone call attempt #3 to patient.   Unable to leave message because she did not have voice mail.  Plan: Will route note to [facility] Pharmacist, Joetta Manners.   Gwenlyn Found, Sherian Rein D PGY1 Pharmacy Resident  Phone [phone] 05/28/2018   10:59 AM  Addendum:  [facility] case is being closed due to the following reasons:  We have been unable to establish and/or maintain contact with the patient.  Joetta Manners, PharmD Clinical Pharmacist [place] [phone]

## 2018-05-29 ENCOUNTER — Ambulatory Visit: Payer: Self-pay

## 2018-05-31 ENCOUNTER — Encounter (INDEPENDENT_AMBULATORY_CARE_PROVIDER_SITE_OTHER): Payer: Self-pay | Admitting: Specialist

## 2018-05-31 ENCOUNTER — Ambulatory Visit (INDEPENDENT_AMBULATORY_CARE_PROVIDER_SITE_OTHER): Payer: PPO | Admitting: Specialist

## 2018-05-31 VITALS — BP 156/84 | HR 76 | Ht 63.0 in | Wt 157.0 lb

## 2018-05-31 DIAGNOSIS — M810 Age-related osteoporosis without current pathological fracture: Secondary | ICD-10-CM | POA: Diagnosis not present

## 2018-05-31 DIAGNOSIS — M1611 Unilateral primary osteoarthritis, right hip: Secondary | ICD-10-CM | POA: Diagnosis not present

## 2018-05-31 DIAGNOSIS — M48062 Spinal stenosis, lumbar region with neurogenic claudication: Secondary | ICD-10-CM

## 2018-05-31 DIAGNOSIS — M4015 Other secondary kyphosis, thoracolumbar region: Secondary | ICD-10-CM

## 2018-05-31 NOTE — Patient Instructions (Signed)
The main ways of treat osteoarthritis, that are found to be success. Weight loss helps to decrease pain. Exercise is important to maintaining cartilage and thickness and strengthening. No NSAIDs like advil or alleve are meds decreasing the inflamation but you can take due to history of ulcer and bleeding in the GI tract requiring a blood transfusion. Ice is okay  In afternoon and evening and hot shower in the am. Avoid bending, stooping and avoid lifting weights greater than 10 lbs. Avoid prolong standing and walking.  Handicap license is approved. Dr. Romona Curls secretary/Assistant will call to arrange for right hip marcaine and steroid injection

## 2018-05-31 NOTE — Progress Notes (Signed)
Office Visit Note   Patient: [name] [name]           Date of Birth: 09/05/1935           MRN: [id number] Visit Date: 05/31/2018              Requested by: [name], [address], [place] [address] PCP: [name], MD   Assessment & Plan: Visit Diagnoses:  1. Unilateral primary osteoarthritis, right hip   2. Spinal stenosis of lumbar region with neurogenic claudication   3. Age-related osteoporosis without current pathological fracture   4. Other secondary kyphosis, thoracolumbar region     Plan: The main ways of treat osteoarthritis, that are found to be success. Weight loss helps to decrease pain. Exercise is important to maintaining cartilage and thickness and strengthening. No NSAIDs like advil or alleve are meds decreasing the inflamation but you can take due to history of ulcer and bleeding in the GI tract requiring a blood transfusion. Ice is okay  In afternoon and evening and hot shower in the am. Avoid bending, stooping and avoid lifting weights greater than 10 lbs. Avoid prolong standing and walking.  Handicap license is approved. Dr. Romona Curls secretary/Assistant will call to arrange for right hip marcaine and steroid injection   Follow-Up Instructions: Return in about 3 weeks (around 06/21/2018).   Orders:  Orders Placed This Encounter  Procedures  . Ambulatory referral to Physical Medicine Rehab   No orders of the defined types were placed in this encounter.     Procedures: No procedures performed   Clinical Data: No additional findings.   Subjective: Chief Complaint  Patient presents with  . Spine - Pain    83 year old female with history of osteoporosis, multiple lumbar compression fractures and multiple vertebroplasties and kyphoplasties. First surgery was a decompression for L4-5 anterolisthesis with stenosis. She was first seen in 2010 with a disc protrusion L4-5 and underwent discectomy with good relief  of pain. She is having persistent pain and discomfort and Is concerned that she needs a second opinion due to persistent pain. There is mainly back pain Is worse than right leg pain. She is concerned about right hip as a source of persistent pain.  She has pain that is severe and it is continuous, whether standing or walking. She has to sit down to get relief. She has had fractures L2,L3,L4 and L5 with chonic compression deformity L1 with autofusion L1-2. She last had right L2 and L3 nerve root decompression and a fusion with pedicle screws and rods.     Review of Systems   Objective: Vital Signs: BP (!) 156/84   Pulse 76   Ht 5\' 3"  (1.6 m)   Wt 157 lb (71.2 kg)   BMI 27.81 kg/m   Physical Exam  Ortho Exam  Specialty Comments:  No specialty comments available.  Imaging: No results found.   PMFS History: Patient Active Problem List   Diagnosis Date Noted  . Lumbar radiculopathy, acute 12/07/2015    Priority: High    Class: Chronic  . Herniated lumbar intervertebral disc 06/25/2012    Priority: High    Class: Acute  . Nausea & vomiting 05/13/2018  . Unresponsiveness 04/14/2018  . Hyperkalemia 04/13/2018  . Intractable nausea and vomiting 04/12/2018  . Abnormal glucose 03/01/2018  . FHx: heart disease 03/01/2018  . Therapeutic opioid-induced constipation (OIC) 12/21/2017  . Abnormal UGI series 12/21/2017  . CKD (chronic kidney disease) stage 3,  GFR 30-59 ml/min (HCC) 09/27/2017  . T12 compression fracture ([place]) 09/26/2017  . Chronic bronchitis ([place]) 08/09/2017  . Atherosclerosis of aorta ([place]) 04/05/2017  . Costochondritis 11/14/2016  . Anxiety state 06/08/2016  . Spondylolisthesis, lumbar region 04/13/2016  . Dysphagia   . Spinal stenosis, lumbar region, with neurogenic claudication 05/29/2015  . DDD (degenerative disc disease), lumbar 04/21/2015  . Hyperlipidemia, mixed 11/24/2014  . Gastroesophageal reflux disease 11/24/2014  . Depression, major, in partial  remission ([place]) 09/26/2014  . Chronic pain syndrome 09/26/2014  . Coronary artery disease due to lipid rich plaque   . Osteoporosis 02/10/2014  . Prediabetes 08/14/2013  . Vitamin D deficiency 08/14/2013  . Medication management 08/14/2013  . Constipation, chronic 04/03/2013  . MGUS (monoclonal gammopathy of unknown significance) 03/05/2013  . Vertebral compression fracture ([place]) 06/23/2012  . ESOPHAGEAL STRICTURE 08/28/2008  . Mild intermittent asthma without complication 34/74/2595  . Incarcerated recurrent paraesophageal hiatal hernia s/p lap redo repair [id number] 01/31/2008  . Essential hypertension 07/25/2007   Past Medical History:  Diagnosis Date  . Anxiety    takes Xanax daily as needed  . Asthma    Albuterol daily as needed  . Blood transfusion without reported diagnosis   . CAD (coronary artery disease)    LHC 2003 with 40% pLAD, 30% mLAD, 100% D1 (moderate vessel), 100%  D2 (small vessel), 95% mid small OM2.  Pt had PTCA to D1;  D2 and OM2 small vessels and tx medically;  Last Myoview (2012): anterior infarct seen with scar, no ischemia. EF normal. Med Rx // Myoview (12/14):  Low risk; ant scar w/ peri-infarct ischemia; EF 55% with ant AK // Myoview 5/16: EF 55-65, ant, apical scar, no ischemia   . Cataract   . Chronic back pain    HNP, DDD, spinal stenosis, vertebral compression fractures.   . Chronic nausea    takes Zofran daily as needed.  normal gastric emptying study 03/2013  . CKD (chronic kidney disease), stage III ([facility]) 09/27/2017  . Constipation    felt to be functional. takes Senokot and Miralax daily.  treated with Linzess in past.   . DIVERTICULOSIS, COLON 08/28/2008   Qualifier: Diagnosis of  By: [name], [name], [name]    . Elevated LFTs 2015   probably med induced DILI, from Augmentin vs Pravastatin  . GERD (gastroesophageal reflux disease)    hx of esophageal stricture   . Hiatal hernia    Paraesophageal hernias. s/p fundoplications in 3329 and 04/2013  .  History of bronchitis several of yrs ago  . History of colon polyps 03/2009, 03/2011   adenomatous colon polyps, no high grade dysplasia.   [name] History of vertigo    no meds  . Hyperlipidemia    takes Pravastatin daily  . Hypertension    takes Amlodipine,Metoprolol,and Losartan daily  . Nausea 03/2016  . Osteoporosis   . PONV (postoperative nausea and vomiting)    yrs ago  . Slow urinary stream    takes Rapaflo daily  . TIA (transient ischemic attack) 1995   no residual effects noted    Family History  Problem Relation Age of Onset  . Colon cancer Mother 52  . Heart attack Father 55       heart attack  . Brain cancer Brother        tumor   . Heart disease Brother   . Heart disease Brother   . Kidney disease Daughter   . Esophageal cancer Neg Hx   . Stomach cancer  Neg Hx     Past Surgical History:  Procedure Laterality Date  . APPENDECTOMY     patient unsure of date  . BACK SURGERY    . BLADDER REPAIR    . CHOLECYSTECTOMY     Patient unsure of date.  . CORONARY ANGIOPLASTY  11/16/2001   1 stent  . ESOPHAGEAL MANOMETRY N/A 03/25/2013   Procedure: ESOPHAGEAL MANOMETRY (EM);  Surgeon: Inda Castle, MD;  Location: [facility];  Service: Endoscopy;  Laterality: N/A;  . ESOPHAGOGASTRODUODENOSCOPY N/A 03/15/2013   Procedure: ESOPHAGOGASTRODUODENOSCOPY (EGD);  Surgeon: Jerene Bears, MD;  Location: [facility] ENDOSCOPY;  Service: Endoscopy;  Laterality: N/A;  . ESOPHAGOGASTRODUODENOSCOPY N/A 12/25/2015   Procedure: ESOPHAGOGASTRODUODENOSCOPY (EGD);  Surgeon: Doran Stabler, MD;  Location: [facility] ENDOSCOPY;  Service: Endoscopy;  Laterality: N/A;  . EYE SURGERY  11/2000   bilateral cataracts with lens implant  . FIXATION KYPHOPLASTY LUMBAR SPINE  X 2   "L3-4"  . HEMORRHOID SURGERY    . HIATAL HERNIA REPAIR  06/03/2011   Procedure: LAPAROSCOPIC REPAIR OF HIATAL HERNIA;  Surgeon: Adin Hector, MD;  Location: [facility];  Service: General;  Laterality: N/A;  . HIATAL HERNIA REPAIR N/A  04/24/2013   Procedure: LAPAROSCOPIC  REPAIR RECURRENT PARASOPHAGEAL HIATAL HERNIA WITH FUNDOPLICATION;  Surgeon: Adin Hector, MD;  Location: [facility];  Service: General;  Laterality: N/A;  . INSERTION OF MESH N/A 04/24/2013   Procedure: INSERTION OF MESH;  Surgeon: Adin Hector, MD;  Location: [facility];  Service: General;  Laterality: N/A;  . IR KYPHO THORACIC WITH BONE BIOPSY  09/29/2017  . IR KYPHO THORACIC WITH BONE BIOPSY  11/03/2017  . LAPAROSCOPIC LYSIS OF ADHESIONS N/A 04/24/2013   Procedure: LAPAROSCOPIC LYSIS OF ADHESIONS;  Surgeon: Adin Hector, MD;  Location: [facility];  Service: General;  Laterality: N/A;  . LAPAROSCOPIC NISSEN FUNDOPLICATION  05/11/7251   Procedure: LAPAROSCOPIC NISSEN FUNDOPLICATION;  Surgeon: Adin Hector, MD;  Location: [facility];  Service: General;  Laterality: N/A;  . LUMBAR FUSION  12/07/2015   Right L2-3 facetectomy, posterolateral fusion L2-3 with pedicle screws, rods, local bone graft, Vivigen cancellous chips. Fusion extended to the L1 level with pedicle screws and rods  . LUMBAR LAMINECTOMY/DECOMPRESSION MICRODISCECTOMY Right 06/26/2012   Procedure: LUMBAR LAMINECTOMY/DECOMPRESSION MICRODISCECTOMY;  Surgeon: Jessy Oto, MD;  Location: [facility];  Service: Orthopedics;  Laterality: Right;  RIGHT L4-5 MICRODISCECTOMY  . LUMBAR LAMINECTOMY/DECOMPRESSION MICRODISCECTOMY N/A 05/29/2015   Procedure: RIGHT L2-3 MICRODISCECTOMY;  Surgeon: Jessy Oto, MD;  Location: [facility];  Service: Orthopedics;  Laterality: N/A;  . TOTAL ABDOMINAL HYSTERECTOMY  1975   partial  . UPPER GASTROINTESTINAL ENDOSCOPY     Social History   Occupational History  . Occupation: Retired    Fish farm manager: RETIRED    Comment: worked at [employer]  . Smoking status: Never Smoker  . Smokeless tobacco: Never Used  Substance and Sexual Activity  . Alcohol use: No  . Drug use: Never  . Sexual activity: Not on file

## 2018-06-06 NOTE — Progress Notes (Signed)
MEDICARE ANNUAL WELLNESS VISIT AND FOLLOW UP  Assessment:   Medicare annual wellness visit 1 year  Essential hypertension - continue medications, DASH diet, exercise and monitor at home. Call if greater than 130/80.  - CBC with Differential/Platelet - BASIC METABOLIC PANEL WITH GFR - Hepatic function panel - TSH  Prediabetes Discussed general issues about diabetes pathophysiology and management., Educational material distributed., Suggested low cholesterol diet., Encouraged aerobic exercise., Discussed foot care., Reminded to get yearly retinal exam. - Hemoglobin A1c   Mixed hyperlipidemia -continue medications, check lipids, decrease fatty foods, increase activity.  - Lipid panel  Coronary artery disease due to lipid rich plaque Control blood pressure, cholesterol, glucose, increase exercise.  - Lipid panel - Hemoglobin A1c  Recurrent major depressive disorder, in partial remission (HCC) Remission, negative screen  Atherosclerosis of aorta (HCC) Control blood pressure, cholesterol, glucose, increase exercise.   Incarcerated recurrent paraesophageal hiatal hernia s/p lap redo repair [phone] Improved, continue PPI   Asthma, unspecified asthma severity, uncomplicated START ON BREO, USE NEBULIZER AT HOME NO FOCAL WHEEZING/CONSOLIDATION Go to the ER if any chest pain, shortness of breath, nausea, dizziness, severe HA, changes vision/speech  Vertebral compression fracture, sequela Continue fosamax, continue Vit D and Ca, weight bearing exercises  Osteoporosis Continue fosamax, continue Vit D and Ca, weight bearing exercises   Herniated lumbar intervertebral disc Continue pain management  Vitamin D deficiency - Vit D  25 hydroxy (rtn osteoporosis monitoring)  MGUS (monoclonal gammopathy of unknown significance) - CBC with Differential/Platelet  Medication management - Magnesium  Chronic pain syndrome Continue pain management   Anxiety continue medications,  stress management techniques discussed, increase water, good sleep hygiene discussed, increase exercise, and increase veggies.    Gastroesophageal reflux disease, esophagitis presence not specified Continue PPI/H2 blocker, diet discussed   ESOPHAGEAL STRICTURE improved   Constipation, chronic Continue miralax  Medicare visit Get MGM, get DEXA, get colonoscopy  Spondylolisthesis, lumbar region RICE, NSAIDS, exercises given, if not better get xray and PT referral or ortho referral.   DDD (degenerative disc disease), lumbar Continue follow up pain management  Spinal stenosis, lumbar region, with neurogenic claudication Continue follow up pain management  History of lumbar spinal fusion Continue follow up pain management    Over 30 minutes of exam, counseling, chart review, and critical decision making was performed  Future Appointments  Date Time Provider [place]  06/13/2018  9:00 AM Magnus Sinning, MD [facility] None  09/10/2018 10:00 AM [name], MD [facility] None  09/11/2018 11:00 AM [name], MD [facility] None  12/03/2018 10:45 AM [name], NP [facility] None     Plan:   During the course of the visit the patient was educated and counseled about appropriate screening and preventive services including:    Pneumococcal vaccine   Influenza vaccine  Td vaccine  Prevnar 13  Screening electrocardiogram  Screening mammography  Bone densitometry screening  Colorectal cancer screening  Diabetes screening  Glaucoma screening  Nutrition counseling   Advanced directives: given info/requested copies   Subjective:   [name] [name] is a 83 y.o. female who presents for Medicare Annual Wellness Visit and 3 month follow up on hypertension, prediabetes, hyperlipidemia, vitamin D def.   She has had cough x Dec, she is having some wheezing and cough, wants more tessalon. She was in the hospital 05/16/2018. She has not had any fever,  chills. She states she wants an inhaler, has been on dulera in the past and did best, can not tolerate symbicort. We  have samples of breo/trelegy, will given samples and write script.    Patient has been followed for HTN, ASCAD/Stents,  HLD, Prediabetes  and Vitamin D Deficiency.    She had 3 ER visits and the final ER visit resulted in a hospitilization for nausea and vomiting, she had dehydration and corrected potassium in the hospital. She has history of Hiatal hernia and is s/p surgery. She had stopped her pain medications abruptly, she is doing well at this time. States the nausea is less.   Patient has a chronic pain syndrome and is followed by Dr Nicholaus Bloom and Dr Louanne Skye.  In 2014 , she had Lumbar Disc surgery and then in Jan 2017 had lumbar decompression followed later in July 2017 a Lumbar Fusion by Dr Louanne Skye. Also in 2014, she underwent Kyphoplasty for L3/L4 Compression Fx's. Getting Right hip steroid injection with Dr. Louanne Skye and following up 06/21/2018.  Patient has hx/o Osteoporosis with T-2.8 at last DexaBMD on 07/08/2016 and is on Fosamax, followed by Dr. Cruzita Lederer   She also has history of PTCA/stent placed in 2003 and low risk myoview in 2014.   She followes with Dr. Melvyn Novas for COPD/asthma.   She has history of MGUS with negative BX.   she has a diagnosis of anxiety and is currently on xanax 0.25-0.5 mg TID PRN, reports symptoms are well controlled on current regimen. she reports she last took 2-3 weeks ago, needs sporadically.   BMI is Body mass index is 29.16 kg/m., she has not been working on diet and exercise. Her weight is up, no edema. She admits to eating more.  Wt Readings from Last 3 Encounters:  06/07/18 164 lb 9.6 oz (74.7 kg)  05/31/18 157 lb (71.2 kg)  05/17/18 158 lb 3.2 oz (71.8 kg)   Her blood pressure has been controlled at home, today their BP is BP: 120/68  She does not workout due to chronic pain.   She is on cholesterol medication and denies myalgias. Her  cholesterol is at goal. The cholesterol last visit was:   Lab Results  Component Value Date   CHOL 131 03/02/2018   HDL 65 03/02/2018   LDLCALC 46 03/02/2018   TRIG 116 03/02/2018   CHOLHDL 2.0 03/02/2018     She has been working on diet and exercise for prediabetes (x 2010), and denies polydipsia, polyuria and visual disturbances. Last A1C in the office was:  Lab Results  Component Value Date   HGBA1C 6.0 (H) 03/02/2018   Last GFR  Lab Results  Component Value Date   GFRNONAA 59 (L) 05/17/2018   Patient is on Vitamin D supplement. Lab Results  Component Value Date   VD25OH 74 03/02/2018      Medication Review Current Outpatient Medications on File Prior to Visit  Medication Sig Dispense Refill  . acetaminophen (TYLENOL) 500 MG tablet Take 1 tablet (500 mg total) by mouth every 6 (six) hours as needed. (Patient taking differently: Take 500-1,000 mg by mouth every 6 (six) hours as needed (pain). ) 30 tablet 0  . albuterol (PROAIR HFA) 108 (90 Base) MCG/ACT inhaler Inhale 2 puffs into the lungs every 6 (six) hours as needed for wheezing or shortness of breath. 18 g 2  . alendronate (FOSAMAX) 70 MG tablet Take with a full glass of water on an empty stomach. (Patient taking differently: Take 70 mg by mouth every Sunday. Take with a full glass of water on an empty stomach.) 12 tablet 0  . ALPRAZolam (XANAX) 0.5  MG tablet Take 1/2 tablet 1- 2 x /day ONLY if needed for Anxiety Attack &  limit to 5 days /week to avoid addiction (Last till Mar 30) (Patient taking differently: Take 0.25 mg by mouth at bedtime as needed for sleep. ) 90 tablet 0  . amLODipine (NORVASC) 10 MG tablet TAKE 1 TABLET BY MOUTH AT BEDTIME (Patient taking differently: Take 10 mg by mouth at bedtime. ) 30 tablet 1  . aspirin EC 81 MG tablet Take 81 mg by mouth at bedtime.     . Cholecalciferol (VITAMIN D3) 5000 units CAPS Take 5,000 Units by mouth 2 (two) times daily.     [name] [place] Calcium 1000 (390 Ca) MG TABS Take  1,000 mg by mouth 2 (two) times daily.    . diclofenac sodium (VOLTAREN) 1 % GEL Apply 2 g topically 4 (four) times daily. (Patient taking differently: Apply 2 g topically 4 (four) times daily as needed (pain). ) 1 Tube 1  . dicyclomine (BENTYL) 10 MG capsule TAKE 1 CAPSULE BY MOUTH EVERY 8 HOURS AS NEEDED FOR SPASM (Patient taking differently: Take 10 mg by mouth 3 (three) times daily as needed for spasms. ) 90 capsule 1  . gabapentin (NEURONTIN) 300 MG capsule TAKE 1 CAPSULE BY MOUTH THREE TIMES DAILY FOR  CHRONIC  PAIN. (Patient taking differently: Take 300 mg by mouth 2 (two) times daily. ) 270 capsule 1  . ipratropium-albuterol (DUONEB) 0.5-2.5 (3) MG/3ML SOLN Take 3 mLs by nebulization every 6 (six) hours as needed (shortness of breath/wheezing).     [name] ketoconazole (NIZORAL) 2 % cream  APPLY ONE APPLICATION TOPICALLY TWO TIMES DAILY (Patient taking differently: Apply 1 application topically 2 (two) times daily as needed for irritation. ) 30 g 1  . losartan (COZAAR) 50 MG tablet Take 1 tablet (50 mg total) by mouth daily. 30 tablet 1  . lubiprostone (AMITIZA) 8 MCG capsule Take 1 capsule (8 mcg total) by mouth 2 (two) times daily with a meal. 60 capsule 2  . Magnesium 400 MG TABS Take 400 mg by mouth daily.    . meclizine (ANTIVERT) 25 MG tablet TAKE ONE-HALF TO ONE TABLET BY MOUTH UP TO THREE TIMES DAILY FOR MOTION SICKNESS/DIZZINESS (Patient taking differently: Take 25 mg by mouth 3 (three) times daily as needed for dizziness (motion sickness). ) 60 tablet 0  . metoprolol tartrate (LOPRESSOR) 50 MG tablet Take 25 mg by mouth at bedtime.     . nitroGLYCERIN (NITROSTAT) 0.4 MG SL tablet Place 1 tablet (0.4 mg total) under the tongue every 5 (five) minutes as needed for chest pain. 90 tablet 3  . omeprazole (PRILOSEC) 20 MG capsule Take 1 capsule daily for heartburn, Reflux & Nausea (Patient taking differently: Take 20 mg by mouth daily. For heartburn, reflux and/or nausea) 90 capsule 3  .  ondansetron (ZOFRAN ODT) 8 MG disintegrating tablet 8mg  ODT q8 hours prn nausea (Patient taking differently: Take 8 mg by mouth every 8 (eight) hours as needed for nausea or vomiting. ) 12 tablet 0  . ondansetron (ZOFRAN) 4 MG tablet Take 1 tablet (4 mg total) by mouth every 8 (eight) hours as needed for nausea or vomiting. 20 tablet 0  . polyethylene glycol powder (GLYCOLAX/MIRALAX) powder Take 17 g by mouth 2 (two) times daily. (Patient taking differently: Take 17 g by mouth daily. ) 527 g 0  . pravastatin (PRAVACHOL) 40 MG tablet TAKE 1 TABLET BY MOUTH DAILY (Patient taking differently: Take 40 mg by mouth at  bedtime. ) 90 tablet 1  . promethazine (PHENERGAN) 25 MG tablet TAKE 1 TABLET BY MOUTH EVERY 6 HOURS AS NEEDED FOR NAUSEA AND VOMITING 100 tablet 0  . senna-docusate (SENOKOT-S) 8.6-50 MG tablet Take 2 tablets by mouth 2 (two) times daily. (Patient taking differently: Take 1 tablet by mouth 2 (two) times daily. ) 60 tablet 1  . sucralfate (CARAFATE) 1 g tablet Take 1 tablet 2 to 4 x /day before meals for Heartburn (Patient taking differently: Take 1 g by mouth 3 (three) times daily before meals. ) 360 tablet 1  . tamsulosin (FLOMAX) 0.4 MG CAPS capsule Take 0.4 mg by mouth daily.  0   No current facility-administered medications on file prior to visit.     Current Problems (verified) Patient Active Problem List   Diagnosis Date Noted  . Nausea & vomiting 05/13/2018  . Unresponsiveness 04/14/2018  . Hyperkalemia 04/13/2018  . FHx: heart disease 03/01/2018  . Therapeutic opioid-induced constipation (OIC) 12/21/2017  . Abnormal UGI series 12/21/2017  . CKD (chronic kidney disease) stage 3, GFR 30-59 ml/min (HCC) 09/27/2017  . T12 compression fracture ([place]) 09/26/2017  . Chronic bronchitis ([place]) 08/09/2017  . Atherosclerosis of aorta ([place]) 04/05/2017  . Anxiety state 06/08/2016  . Spinal stenosis, lumbar region, with neurogenic claudication 05/29/2015  . DDD (degenerative disc  disease), lumbar 04/21/2015  . Hyperlipidemia, mixed 11/24/2014  . Gastroesophageal reflux disease 11/24/2014  . Depression, major, in partial remission ([place]) 09/26/2014  . Chronic pain syndrome 09/26/2014  . Coronary artery disease due to lipid rich plaque   . Osteoporosis 02/10/2014  . Prediabetes 08/14/2013  . Vitamin D deficiency 08/14/2013  . Medication management 08/14/2013  . MGUS (monoclonal gammopathy of unknown significance) 03/05/2013  . Vertebral compression fracture ([place]) 06/23/2012  . ESOPHAGEAL STRICTURE 08/28/2008  . Mild intermittent asthma without complication 03/47/4259  . Incarcerated recurrent paraesophageal hiatal hernia s/p lap redo repair [id number] 01/31/2008  . Essential hypertension 07/25/2007    Screening Tests Immunization History  Administered Date(s) Administered  . DT 05/06/2014  . Influenza Split 02/07/2011  . Influenza Whole 02/09/2009, 01/19/2010, 02/13/2012  . Influenza, High Dose Seasonal PF 03/22/2013, 01/15/2014, 02/27/2015, 01/14/2016, 01/18/2017, 02/14/2018  . Pneumococcal Conjugate-13 05/06/2014  . Pneumococcal Polysaccharide-23 02/09/2011  . Pneumococcal-Unspecified 02/06/2010, 02/09/2012  . Td 05/10/2003    Preventative care: Last colonoscopy: 05/2017 EGD 12/2015 Last mammogram: 11/2016 DUE Last pap smear/pelvic exam: remote,declines   DEXA 07/2016 CT head 01/2014 Ct AB 11/2013 CT chest 11/2013 Myocardial perfusion 09/2014, previous MI, low risk study Echo 08/2014, EF 55-60%  Prior vaccinations: TD or Tdap: 04/2014  Influenza: 2018  Pneumococcal: 2012 Prevnar13:  2015 Shingles/Zostavax: declines due to cost  Names of Other Physician/Practitioners you currently use: 1. [facility]- here for primary care 2. Dr. Katy Fitch eye doctor, last visit 2019 3. No dentist, dentures.    Patient Care Team: [name], MD as PCP - General (Internal Medicine) End, Harrell Gave, MD as PCP -  Cardiology (Cardiology) Inda Castle, MD (Inactive) as Consulting Physician (Gastroenterology) Rigoberto Noel, MD as Consulting Physician (Pulmonary Disease) Larey Dresser, MD as Consulting Physician (Cardiology) Michael Boston, MD as Consulting Physician (General Surgery) Jessy Oto, MD as Consulting Physician (Orthopedic Surgery) Philemon Kingdom, MD as Consulting Physician (Endocrinology) Curt Bears, MD as Consulting Physician (Oncology)  Allergies Allergies  Allergen Reactions  . Ace Inhibitors Cough    Per Dr Melvyn Novas pulmonology 2013  . Aspartame Diarrhea and Nausea And Vomiting  . Atorvastatin  Other (See Comments)    Muscle aches  . Biaxin [Clarithromycin] Other (See Comments)    Unknown reaction : PATIENT CAN TOLERATE Z-PAK.  Is written on patient's paper chart.  [name] [Roflumilast] Nausea And Vomiting  . Erythromycin Other (See Comments)    Unknown reaction: PATIENT CAN TOLERATE Z-PAK.  It is written on patient's paper chart.  . Levofloxacin Nausea And Vomiting    SURGICAL HISTORY She  has a past surgical history that includes Back surgery; Bladder repair; Hemorrhoid surgery; Cholecystectomy; Appendectomy; Total abdominal hysterectomy (1975); Eye surgery (11/2000); Hiatal hernia repair (06/03/2011); Laparoscopic Nissen fundoplication (9/51/8841); Lumbar laminectomy/decompression microdiscectomy (Right, 06/26/2012); Esophagogastroduodenoscopy (N/A, 03/15/2013); Esophageal manometry (N/A, 03/25/2013); Hiatal hernia repair (N/A, 04/24/2013); Insertion of mesh (N/A, 04/24/2013); Laparoscopic lysis of adhesions (N/A, 04/24/2013); Fixation kyphoplasty lumbar spine (X 2); Coronary angioplasty (11/16/2001); Lumbar laminectomy/decompression microdiscectomy (N/A, 05/29/2015); Lumbar fusion (12/07/2015); Esophagogastroduodenoscopy (N/A, 12/25/2015); IR KYPHO THORACIC WITH BONE BIOPSY (09/29/2017); IR KYPHO THORACIC WITH BONE BIOPSY (11/03/2017); and Upper gastrointestinal  endoscopy. FAMILY HISTORY Her family history includes Brain cancer in her brother; Colon cancer (age of onset: 40) in her mother; Heart attack (age of onset: 54) in her father; Heart disease in her brother and brother; Kidney disease in her daughter. SOCIAL HISTORY She  reports that she has never smoked. She has never used smokeless tobacco. She reports that she does not drink alcohol or use drugs.  MEDICARE WELLNESS OBJECTIVES: Physical activity:   Cardiac risk factors:   Depression/mood screen:   Depression screen [facility] 2/9 05/20/2018  Decreased Interest 0  Down, Depressed, Hopeless 0  PHQ - 2 Score 0  Some recent data might be hidden    ADLs:  In your present state of health, do you have any difficulty performing the following activities: 05/13/2018 04/22/2018  Hearing? N N  Vision? N N  Difficulty concentrating or making decisions? N N  Walking or climbing stairs? Y N  Dressing or bathing? N N  Doing errands, shopping? N N  Comment - -  Some recent data might be hidden     Cognitive Testing  Alert? Yes  Normal Appearance?Yes  Oriented to person? Yes  Place? Yes   Time? Yes  Recall of three objects?  Yes  Can perform simple calculations? Yes  Displays appropriate judgment?Yes  Can read the correct time from a watch face?Yes  EOL planning: Does Patient Have a Medical Advance Directive?: Yes Type of Advance Directive: Healthcare Power of Attorney, Living will Copy of [place] in Chart?: No - copy requested   Objective:   Today's Vitals   06/07/18 1039  BP: 120/68  Pulse: 84  Temp: (!) 97.4 F (36.3 C)  SpO2: 97%  Weight: 164 lb 9.6 oz (74.7 kg)  Height: 5\' 3"  (1.6 m)   Body mass index is 29.16 kg/m.  General appearance: alert, no distress, WD/WN,  female HEENT: normocephalic, sclerae anicteric, TMs pearly, nares patent, no discharge or erythema, pharynx normal Oral cavity: MMM, no lesions Neck: supple, no lymphadenopathy, no thyromegaly, no  masses Heart: RRR, normal S1, S2, no murmurs Lungs: CTA bilaterally, no wheezes, rhonchi, or rales Abdomen: +bs, soft, non tender, non distended, no masses, no hepatomegaly, no splenomegaly Musculoskeletal: nontender, no swelling, no obvious deformity Extremities: no edema, no cyanosis, no clubbing Pulses: 2+ symmetric, upper and lower extremities, normal cap refill Neurological: alert, oriented x 3, CN2-12 intact, strength 4/5 upper extremities and lower extremities, DTRs 2+ throughout, no cerebellar signs, gait antaglic with cane Psychiatric: normal  affect, behavior normal, pleasant  Breast: defer Gyn: defer Rectal: defer   Medicare Attestation I have personally reviewed: The patient's medical and social history Their use of alcohol, tobacco or illicit drugs Their current medications and supplements The patient's functional ability including ADLs,fall risks, home safety risks, cognitive, and hearing and visual impairment Diet and physical activities Evidence for depression or mood disorders  The patient's weight, height, BMI, and visual acuity have been recorded in the chart.  I have made referrals, counseling, and provided education to the patient based on review of the above and I have provided the patient with a written personalized care plan for preventive services.     Vicie Mutters, PA-C   06/07/2018

## 2018-06-07 ENCOUNTER — Ambulatory Visit (INDEPENDENT_AMBULATORY_CARE_PROVIDER_SITE_OTHER): Payer: PPO | Admitting: Physician Assistant

## 2018-06-07 ENCOUNTER — Encounter: Payer: Self-pay | Admitting: Physician Assistant

## 2018-06-07 ENCOUNTER — Ambulatory Visit: Payer: Self-pay | Admitting: Adult Health Nurse Practitioner

## 2018-06-07 VITALS — BP 120/68 | HR 84 | Temp 97.4°F | Ht 63.0 in | Wt 164.6 lb

## 2018-06-07 DIAGNOSIS — R6889 Other general symptoms and signs: Secondary | ICD-10-CM | POA: Diagnosis not present

## 2018-06-07 DIAGNOSIS — K222 Esophageal obstruction: Secondary | ICD-10-CM

## 2018-06-07 DIAGNOSIS — M5136 Other intervertebral disc degeneration, lumbar region: Secondary | ICD-10-CM

## 2018-06-07 DIAGNOSIS — G894 Chronic pain syndrome: Secondary | ICD-10-CM

## 2018-06-07 DIAGNOSIS — I10 Essential (primary) hypertension: Secondary | ICD-10-CM | POA: Diagnosis not present

## 2018-06-07 DIAGNOSIS — F3341 Major depressive disorder, recurrent, in partial remission: Secondary | ICD-10-CM

## 2018-06-07 DIAGNOSIS — I2510 Atherosclerotic heart disease of native coronary artery without angina pectoris: Secondary | ICD-10-CM

## 2018-06-07 DIAGNOSIS — N183 Chronic kidney disease, stage 3 unspecified: Secondary | ICD-10-CM

## 2018-06-07 DIAGNOSIS — I700 Atherosclerosis of aorta: Secondary | ICD-10-CM

## 2018-06-07 DIAGNOSIS — M810 Age-related osteoporosis without current pathological fracture: Secondary | ICD-10-CM

## 2018-06-07 DIAGNOSIS — S32000S Wedge compression fracture of unspecified lumbar vertebra, sequela: Secondary | ICD-10-CM

## 2018-06-07 DIAGNOSIS — R7303 Prediabetes: Secondary | ICD-10-CM | POA: Diagnosis not present

## 2018-06-07 DIAGNOSIS — T402X5A Adverse effect of other opioids, initial encounter: Secondary | ICD-10-CM

## 2018-06-07 DIAGNOSIS — J42 Unspecified chronic bronchitis: Secondary | ICD-10-CM | POA: Diagnosis not present

## 2018-06-07 DIAGNOSIS — K5903 Drug induced constipation: Secondary | ICD-10-CM

## 2018-06-07 DIAGNOSIS — Z0001 Encounter for general adult medical examination with abnormal findings: Secondary | ICD-10-CM | POA: Diagnosis not present

## 2018-06-07 DIAGNOSIS — E559 Vitamin D deficiency, unspecified: Secondary | ICD-10-CM

## 2018-06-07 DIAGNOSIS — E782 Mixed hyperlipidemia: Secondary | ICD-10-CM

## 2018-06-07 DIAGNOSIS — J4520 Mild intermittent asthma, uncomplicated: Secondary | ICD-10-CM

## 2018-06-07 DIAGNOSIS — M51369 Other intervertebral disc degeneration, lumbar region without mention of lumbar back pain or lower extremity pain: Secondary | ICD-10-CM

## 2018-06-07 DIAGNOSIS — K219 Gastro-esophageal reflux disease without esophagitis: Secondary | ICD-10-CM

## 2018-06-07 DIAGNOSIS — S22080S Wedge compression fracture of T11-T12 vertebra, sequela: Secondary | ICD-10-CM

## 2018-06-07 DIAGNOSIS — D472 Monoclonal gammopathy: Secondary | ICD-10-CM

## 2018-06-07 DIAGNOSIS — I2583 Coronary atherosclerosis due to lipid rich plaque: Secondary | ICD-10-CM

## 2018-06-07 DIAGNOSIS — K440 Diaphragmatic hernia with obstruction, without gangrene: Secondary | ICD-10-CM

## 2018-06-07 DIAGNOSIS — Z0000 Encounter for general adult medical examination without abnormal findings: Secondary | ICD-10-CM

## 2018-06-07 DIAGNOSIS — Z79899 Other long term (current) drug therapy: Secondary | ICD-10-CM

## 2018-06-07 MED ORDER — FLUTICASONE FUROATE-VILANTEROL 100-25 MCG/INH IN AEPB: 1.0000 | INHALATION_SPRAY | Freq: Every day | RESPIRATORY_TRACT | 3 refills | Status: DC

## 2018-06-07 MED ORDER — BENZONATATE 200 MG PO CAPS: 200.0000 mg | ORAL_CAPSULE | Freq: Three times a day (TID) | ORAL | 2 refills | Status: DC | PRN

## 2018-06-07 NOTE — Patient Instructions (Signed)
INFORMATION ABOUT YOUR STEROID INHALER  Can do steroid inhaler, NEED TO DO DAILY, this is NOT a rescue inhaler so if you are acutely short of breath please use your albuterol or call 911.  Do 1 puff ONCE a day.  Do before you brush your teeth OR wash your mouth afterwards.  IF YOU DO NOT [place] YOUR MOUTH OUT IT CAN CAUSE YEAST Can do 2 tsp vinegar with water and switch to help prevent yeast or help yeast in your mouth.   Go to the ER if any chest pain, shortness of breath, nausea, dizziness, severe HA, changes vision/speech  Do the breathing nebulizer twice a day and can do AS needed during the day     When it comes to diets, agreement about the perfect plan isn't easy to find, even among the experts. Experts at the [employer] developed an idea known as the Healthy Eating Plate. Just imagine a plate divided into logical, healthy portions.  The emphasis is on diet quality:  Load up on vegetables and fruits - one-half of your plate: Aim for color and variety, and remember that potatoes don't count.  Go for whole grains - one-quarter of your plate: Whole wheat, barley, wheat berries, quinoa, oats, brown rice, and foods made with them. If you want pasta, go with whole wheat pasta.  Protein power - one-quarter of your plate: Fish, chicken, beans, and nuts are all healthy, versatile protein sources. Limit red meat.  The diet, however, does go beyond the plate, offering a few other suggestions.  Use healthy plant oils, such as olive, canola, soy, corn, sunflower and peanut. Check the labels, and avoid partially hydrogenated oil, which have unhealthy trans fats.  If you're thirsty, drink water. Coffee and tea are good in moderation, but skip sugary drinks and limit milk and dairy products to one or two daily servings.  The type of carbohydrate in the diet is more important than the amount. Some sources of carbohydrates, such as vegetables, fruits, whole grains, and  beans-are healthier than others.  Finally, stay active.

## 2018-06-08 ENCOUNTER — Telehealth: Payer: Self-pay

## 2018-06-08 LAB — HEMOGLOBIN A1C
Hgb A1c MFr Bld: 5.8 % of total Hgb — ABNORMAL HIGH (ref <5.7)
Mean Plasma Glucose: 120 (calc)
eAG (mmol/L): 6.6 (calc)

## 2018-06-08 LAB — CBC WITH DIFFERENTIAL/PLATELET
Absolute Monocytes: 853 cells/uL (ref 200–950)
BASOS ABS: 74 {cells}/uL (ref 0–200)
Basophils Relative: 0.9 %
Eosinophils Absolute: 525 cells/uL — ABNORMAL HIGH (ref 15–500)
Eosinophils Relative: 6.4 %
HCT: 43.1 % (ref 35.0–45.0)
Hemoglobin: 14.6 g/dL (ref 11.7–15.5)
LYMPHS ABS: 2198 {cells}/uL (ref 850–3900)
MCH: 31.9 pg (ref 27.0–33.0)
MCHC: 33.9 g/dL (ref 32.0–36.0)
MCV: 94.1 fL (ref 80.0–100.0)
MPV: 11.1 fL (ref 7.5–12.5)
Monocytes Relative: 10.4 %
Neutro Abs: 4551 cells/uL (ref 1500–7800)
Neutrophils Relative %: 55.5 %
Platelets: 382 10*3/uL (ref 140–400)
RBC: 4.58 10*6/uL (ref 3.80–5.10)
RDW: 12.7 % (ref 11.0–15.0)
Total Lymphocyte: 26.8 %
WBC: 8.2 10*3/uL (ref 3.8–10.8)

## 2018-06-08 LAB — COMPLETE METABOLIC PANEL WITH GFR
AG Ratio: 1.4 (calc) (ref 1.0–2.5)
ALT: 15 U/L (ref 6–29)
AST: 13 U/L (ref 10–35)
Albumin: 4.3 g/dL (ref 3.6–5.1)
Alkaline phosphatase (APISO): 64 U/L (ref 33–130)
BUN/Creatinine Ratio: 21 (calc) (ref 6–22)
BUN: 20 mg/dL (ref 7–25)
CO2: 25 mmol/L (ref 20–32)
Calcium: 10.1 mg/dL (ref 8.6–10.4)
Chloride: 104 mmol/L (ref 98–110)
Creat: 0.95 mg/dL — ABNORMAL HIGH (ref 0.60–0.88)
GFR, Est African American: 66 mL/min/{1.73_m2} (ref >=60)
GFR, Est Non African American: 57 mL/min/{1.73_m2} — ABNORMAL LOW (ref >=60)
Globulin: 3 g/dL (calc) (ref 1.9–3.7)
Glucose, Bld: 109 mg/dL — ABNORMAL HIGH (ref 65–99)
Potassium: 4.4 mmol/L (ref 3.5–5.3)
Sodium: 141 mmol/L (ref 135–146)
Total Bilirubin: 0.7 mg/dL (ref 0.2–1.2)
Total Protein: 7.3 g/dL (ref 6.1–8.1)

## 2018-06-08 LAB — LIPID PANEL
Cholesterol: 195 mg/dL (ref <200)
HDL: 72 mg/dL (ref >50)
LDL Cholesterol (Calc): 101 mg/dL (calc) — ABNORMAL HIGH
Non-HDL Cholesterol (Calc): 123 mg/dL (calc) (ref <130)
TRIGLYCERIDES: 123 mg/dL (ref <150)
Total CHOL/HDL Ratio: 2.7 (calc) (ref <5.0)

## 2018-06-08 LAB — TSH: TSH: 0.59 mIU/L (ref 0.40–4.50)

## 2018-06-08 NOTE — Telephone Encounter (Signed)
-----   Message from Vicie Mutters, [place] sent at 06/08/2018 11:30 AM EST ----- Regarding: RE: med request Contact: [phone] Ask the patient to see what inhalers the insurance covers.  Do the samples until we find that out.  Tell her to contact her pain management, if the pain gets worse go to the ER/urgent care since it is a Friday we will not be able to see her until Monday.  ----- Message ----- From: Elenor Quinones, CMA Sent: 06/08/2018  10:12 AM EST To: Vicie Mutters, PA-C Subject: med request                                    Insurance will not cover cough meds so patient would like something else called into.  Also patient reports a pain in her waistline that causes her to lose her breath please advise

## 2018-06-08 NOTE — Telephone Encounter (Signed)
Pt states she will call her insurance in order to find out what they will cover and give [place] call back.  Pt voiced understanding of lab results.  Pt will call our office if she needs to come in.

## 2018-06-13 ENCOUNTER — Encounter: Payer: Self-pay | Admitting: Adult Health

## 2018-06-13 ENCOUNTER — Encounter (INDEPENDENT_AMBULATORY_CARE_PROVIDER_SITE_OTHER): Payer: Self-pay | Admitting: Physical Medicine and Rehabilitation

## 2018-06-13 ENCOUNTER — Ambulatory Visit (INDEPENDENT_AMBULATORY_CARE_PROVIDER_SITE_OTHER): Payer: Self-pay

## 2018-06-13 ENCOUNTER — Ambulatory Visit (INDEPENDENT_AMBULATORY_CARE_PROVIDER_SITE_OTHER): Payer: PPO | Admitting: Adult Health

## 2018-06-13 ENCOUNTER — Ambulatory Visit (INDEPENDENT_AMBULATORY_CARE_PROVIDER_SITE_OTHER): Payer: PPO | Admitting: Physical Medicine and Rehabilitation

## 2018-06-13 VITALS — BP 130/68 | HR 95 | Temp 97.7°F | Ht 63.0 in | Wt 164.0 lb

## 2018-06-13 DIAGNOSIS — R0789 Other chest pain: Secondary | ICD-10-CM | POA: Diagnosis not present

## 2018-06-13 DIAGNOSIS — M25551 Pain in right hip: Secondary | ICD-10-CM

## 2018-06-13 NOTE — Progress Notes (Signed)
Assessment and Plan:  Leonard was seen today for flank pain.  Diagnoses and all orders for this visit:  Right-sided chest wall pain She was mainly concerned about r/o complication r/t CKD, very anxious regarding this Reviewed recent labs and imaging including CT abd, XR chest/abd - r/o renal calculi, CXR without osseous abnormality No trauma, recent labs - CBC, CMP obtained since onset of pain are normal  No rash suggesting shingles She has chest wall tenderness, discussed most likely benign musculoskeletal issue She is reassured based on this assessment and is in aggreement to continue pain management interventions and monitoring, declines repeat XR imaging today Follow up if having increasingly productive cough, fever/chills, urinary symptoms, N/V, etc.   Further disposition pending results of labs. Discussed med's effects and SE's.   Over 15 minutes of exam, counseling, chart review, and critical decision making was performed.   Future Appointments  Date Time Provider [place]  09/10/2018 10:00 AM [name], MD [facility] None  09/11/2018 11:00 AM [name], MD [facility] None  12/03/2018 10:45 AM [name], NP [facility] None    ------------------------------------------------------------------------------------------------------------------   HPI BP 130/68   Pulse 95   Temp 97.7 F (36.5 C)   Ht 5\' 3"  (1.6 m)   Wt 164 lb (74.4 kg)   SpO2 95%   BMI 29.05 kg/m   82 y.o.female with hx of chronic bronchitis/asthma on inhalers, chronic pain syndrome, CKD III, MGUS presents for evaluation of R flank pain that wraps around to under her breast approximately 1 week, onset was gradual, describes as "ache" - reports is constant, 6/10, keeps her awake. She denies rash, nausea/vomiting, abdominal pain, dyspnea (other than consistent with baseline, mild exertional, improved by inhalers), cough is similar to baseline, occasional dry cough. Denies urinary  symptoms, hematuria, fever/chills.   She has been applying heat, hasn't tried ice, has been taking tylenol and gabapentin 300 mg TID for other chronic pain. She reports she has been doing lidocaine patches which some improvement. She has also been applying voltaren gel. She is followed by Dr. Hardin Negus (pain management) for chronic pain.   She is mainly concerned that this may be r/t "chronic kidney disease" which she is newly aware of after recent visit on 06/07/2017 with Vicie Mutters, PA at our office. She reports pain was present at that time. Routine labs including CMP/GFR, CBC were drawn and without significant changes.   She denies fall, trauma; has been doing some stretches recently every morning and wonders if this may have contributed;   She was recently in ED then hospitalized for dehydration secondary to N/V; CT abd/pelvis 1. No acute findings.  No findings to account abdominal pain. 2. Chronic findings include chronic intra and extrahepatic bile duct dilation, aortic atherosclerosis, and multiple vertebral fractures. CXR at that time showed no acute changes, Normal vascularity. Lungs are underaerated with linear atelectasis at the right lung base. No pneumothorax or pleural effusion.     Past Medical History:  Diagnosis Date  . Anxiety    takes Xanax daily as needed  . Asthma    Albuterol daily as needed  . Blood transfusion without reported diagnosis   . CAD (coronary artery disease)    LHC 2003 with 40% pLAD, 30% mLAD, 100% D1 (moderate vessel), 100%  D2 (small vessel), 95% mid small OM2.  Pt had PTCA to D1;  D2 and OM2 small vessels and tx medically;  Last Myoview (2012): anterior infarct seen with scar, no ischemia. EF normal. Med  Rx // Myoview (12/14):  Low risk; ant scar w/ peri-infarct ischemia; EF 55% with ant AK // Myoview 5/16: EF 55-65, ant, apical scar, no ischemia   . Cataract   . Chronic back pain    HNP, DDD, spinal stenosis, vertebral compression fractures.   .  Chronic nausea    takes Zofran daily as needed.  normal gastric emptying study 03/2013  . CKD (chronic kidney disease), stage III ([place]) 09/27/2017  . Constipation    felt to be functional. takes Senokot and Miralax daily.  treated with Linzess in past.   . DIVERTICULOSIS, COLON 08/28/2008   Qualifier: Diagnosis of  By: [name], [name], [name]    . Elevated LFTs 2015   probably med induced DILI, from Augmentin vs Pravastatin  . GERD (gastroesophageal reflux disease)    hx of esophageal stricture   . Hiatal hernia    Paraesophageal hernias. s/p fundoplications in 2536 and 04/2013  . History of bronchitis several of yrs ago  . History of colon polyps 03/2009, 03/2011   adenomatous colon polyps, no high grade dysplasia.   [name] History of vertigo    no meds  . Hyperlipidemia    takes Pravastatin daily  . Hypertension    takes Amlodipine,Metoprolol,and Losartan daily  . Nausea 03/2016  . Osteoporosis   . PONV (postoperative nausea and vomiting)    yrs ago  . Slow urinary stream    takes Rapaflo daily  . TIA (transient ischemic attack) 1995   no residual effects noted     Allergies  Allergen Reactions  . Ace Inhibitors Cough    Per Dr Melvyn Novas pulmonology 2013  . Aspartame Diarrhea and Nausea And Vomiting  . Atorvastatin Other (See Comments)    Muscle aches  . Biaxin [Clarithromycin] Other (See Comments)    Unknown reaction : PATIENT CAN TOLERATE Z-PAK.  Is written on patient's paper chart.  [name] [Roflumilast] Nausea And Vomiting  . Erythromycin Other (See Comments)    Unknown reaction: PATIENT CAN TOLERATE Z-PAK.  It is written on patient's paper chart.  . Levofloxacin Nausea And Vomiting    Current Outpatient Medications on File Prior to Visit  Medication Sig  . acetaminophen (TYLENOL) 500 MG tablet Take 1 tablet (500 mg total) by mouth every 6 (six) hours as needed. (Patient taking differently: Take 500-1,000 mg by mouth every 6 (six) hours as needed (pain). )  . albuterol  (PROAIR HFA) 108 (90 Base) MCG/ACT inhaler Inhale 2 puffs into the lungs every 6 (six) hours as needed for wheezing or shortness of breath.  [name] alendronate (FOSAMAX) 70 MG tablet Take with a full glass of water on an empty stomach. (Patient taking differently: Take 70 mg by mouth every Sunday. Take with a full glass of water on an empty stomach.)  . ALPRAZolam (XANAX) 0.5 MG tablet Take 1/2 tablet 1- 2 x /day ONLY if needed for Anxiety Attack &  limit to 5 days /week to avoid addiction (Last till Mar 30) (Patient taking differently: Take 0.25 mg by mouth at bedtime as needed for sleep. )  . amLODipine (NORVASC) 10 MG tablet TAKE 1 TABLET BY MOUTH AT BEDTIME (Patient taking differently: Take 10 mg by mouth at bedtime. )  . aspirin EC 81 MG tablet Take 81 mg by mouth at bedtime.   . Cholecalciferol (VITAMIN D3) 5000 units CAPS Take 5,000 Units by mouth 2 (two) times daily.   . Coral Calcium 1000 (390 Ca) MG TABS Take 1,000 mg by mouth  2 (two) times daily.  . diclofenac sodium (VOLTAREN) 1 % GEL Apply 2 g topically 4 (four) times daily. (Patient taking differently: Apply 2 g topically 4 (four) times daily as needed (pain). )  . dicyclomine (BENTYL) 10 MG capsule TAKE 1 CAPSULE BY MOUTH EVERY 8 HOURS AS NEEDED FOR SPASM (Patient taking differently: Take 10 mg by mouth 3 (three) times daily as needed for spasms. )  . fluticasone furoate-vilanterol (BREO ELLIPTA) 100-25 MCG/INH AEPB Inhale 1 puff into the lungs daily. Rinse mouth with water after each use  . gabapentin (NEURONTIN) 300 MG capsule TAKE 1 CAPSULE BY MOUTH THREE TIMES DAILY FOR  CHRONIC  PAIN. (Patient taking differently: Take 300 mg by mouth 2 (two) times daily. )  . ipratropium-albuterol (DUONEB) 0.5-2.5 (3) MG/3ML SOLN Take 3 mLs by nebulization every 6 (six) hours as needed (shortness of breath/wheezing).   [name] ketoconazole (NIZORAL) 2 % cream  APPLY ONE APPLICATION TOPICALLY TWO TIMES DAILY (Patient taking differently: Apply 1 application  topically 2 (two) times daily as needed for irritation. )  . losartan (COZAAR) 50 MG tablet Take 1 tablet (50 mg total) by mouth daily.  [name] lubiprostone (AMITIZA) 8 MCG capsule Take 1 capsule (8 mcg total) by mouth 2 (two) times daily with a meal.  . Magnesium 400 MG TABS Take 400 mg by mouth daily.  . meclizine (ANTIVERT) 25 MG tablet TAKE ONE-HALF TO ONE TABLET BY MOUTH UP TO THREE TIMES DAILY FOR MOTION SICKNESS/DIZZINESS (Patient taking differently: Take 25 mg by mouth 3 (three) times daily as needed for dizziness (motion sickness). )  . metoprolol tartrate (LOPRESSOR) 50 MG tablet Take 25 mg by mouth at bedtime.   . nitroGLYCERIN (NITROSTAT) 0.4 MG SL tablet Place 1 tablet (0.4 mg total) under the tongue every 5 (five) minutes as needed for chest pain.  [name] omeprazole (PRILOSEC) 20 MG capsule Take 1 capsule daily for heartburn, Reflux & Nausea (Patient taking differently: Take 20 mg by mouth daily. For heartburn, reflux and/or nausea)  . ondansetron (ZOFRAN ODT) 8 MG disintegrating tablet 8mg  ODT q8 hours prn nausea (Patient taking differently: Take 8 mg by mouth every 8 (eight) hours as needed for nausea or vomiting. )  . ondansetron (ZOFRAN) 4 MG tablet Take 1 tablet (4 mg total) by mouth every 8 (eight) hours as needed for nausea or vomiting.  . polyethylene glycol powder (GLYCOLAX/MIRALAX) powder Take 17 g by mouth 2 (two) times daily. (Patient taking differently: Take 17 g by mouth daily. )  . pravastatin (PRAVACHOL) 40 MG tablet TAKE 1 TABLET BY MOUTH DAILY (Patient taking differently: Take 40 mg by mouth at bedtime. )  . promethazine (PHENERGAN) 25 MG tablet TAKE 1 TABLET BY MOUTH EVERY 6 HOURS AS NEEDED FOR NAUSEA AND VOMITING  . senna-docusate (SENOKOT-S) 8.6-50 MG tablet Take 2 tablets by mouth 2 (two) times daily. (Patient taking differently: Take 1 tablet by mouth 2 (two) times daily. )  . sucralfate (CARAFATE) 1 g tablet Take 1 tablet 2 to 4 x /day before meals for Heartburn (Patient  taking differently: Take 1 g by mouth 3 (three) times daily before meals. )  . tamsulosin (FLOMAX) 0.4 MG CAPS capsule Take 0.4 mg by mouth daily.  . benzonatate (TESSALON) 200 MG capsule Take 1 capsule (200 mg total) by mouth 3 (three) times daily as needed for cough (Max: 600mg  per day). (Patient not taking: Reported on 06/13/2018)   No current facility-administered medications on file prior to visit.  ROS: all negative except above.   Physical Exam:  BP 130/68   Pulse 95   Temp 97.7 F (36.5 C)   Ht 5\' 3"  (1.6 m)   Wt 164 lb (74.4 kg)   SpO2 95%   BMI 29.05 kg/m   General Appearance: Well nourished, in no apparent distress. Eyes:  conjunctiva no swelling or erythema ENT/Mouth: No erythema, swelling, or exudate on post pharynx.  Tonsils not swollen or erythematous. Hearing normal.  Neck: Supple Respiratory: Respiratory effort normal, BS equal bilaterally without rales, rhonchi, wheezing or stridor.  Cardio: RRR with no MRGs. Brisk peripheral pulses without edema.  Abdomen: Soft, + BS.  Non tender, no guarding, rebound, hernias, masses. Lymphatics: Non tender without lymphadenopathy.  Musculoskeletal: Full ROM, Symemtrical UE strength, slow gait with cane, she does have tenderness over R lower chest wall without palpable abnormality, no CVA tenderness Skin: Warm, dry without rashes, lesions, ecchymosis.  Neuro: Normal muscle tone Psych: Awake and oriented X 3, normal affect, Insight and Judgment appropriate.     Izora Ribas, NP 4:16 PM [facility]

## 2018-06-13 NOTE — Progress Notes (Signed)
   [name] [name] - 83 y.o. female MRN [id number]  Date of birth: [birth year]  Office Visit Note: Visit Date: 06/13/2018 PCP: [name], MD Referred by: [name], MD  Subjective: Chief Complaint  Patient presents with  . Lower Back - Pain  . Right Hip - Pain   HPI:  [name] [name] is a 83 y.o. female who comes in today At the request of Dr. [name] for diagnostic currently therapeutic right intra-articular hip anesthetic arthrogram.  Patient is having chronic right hip pain worse with standing and ambulating.  We have seen her in the past on multiple occasions for her back.  ROS Otherwise per HPI.  Assessment & Plan: Visit Diagnoses:  1. Pain in right hip     Plan: Findings:  Diagnostic and hopefully therapeutic anesthetic hip arthrogram on the right.  Patient did have relief during the anesthetic phase.    Meds & Orders: No orders of the defined types were placed in this encounter.   Orders Placed This Encounter  Procedures  . Large Joint Inj: R hip joint  . XR C-ARM NO REPORT    Follow-up: No follow-ups on file.   Procedures: Large Joint Inj: R hip joint on 06/13/2018 8:55 AM Indications: pain and diagnostic evaluation Details: 22 G needle, anterior approach  Arthrogram: Yes  Medications: 60 mg triamcinolone acetonide 40 MG/ML; 4 mL bupivacaine 0.25 % Outcome: tolerated well, no immediate complications  Arthrogram demonstrated excellent flow of contrast throughout the joint surface without extravasation or obvious defect.  The patient had relief of symptoms during the anesthetic phase of the injection.  Procedure, treatment alternatives, risks and benefits explained, specific risks discussed. Consent was given by the patient. Immediately prior to procedure a time out was called to verify the correct patient, procedure, equipment, support staff and site/side marked as required. Patient was prepped and draped in the usual sterile fashion.      No notes  on file   Clinical History: CT Lumbar 08/11/2016  IMPRESSION: Chronic vertebral body fusion at T12-L1. Old compression deformity of T12 and L1.  Previous vertebral augmentation at L2, L3 and L4. No evidence of progressive fracture at those levels. Pedicle screw on the right at L2 is lateral to the pedicle and vertebral body. There is some methylmethacrylate in paravertebral veins on the right. Screw position and methylmethacrylate could possibly affect the right L1 nerve. Some methylmethacrylate in the L2-3 disc, not significant.  Probable ongoing motion at L1-2 based on lucency around the L1 screws. Screws breech the superior cortex of the vertebral body, more on the right than the left. No evidence of loosening or motion at L2-3.  Previous posterior decompression on the right at L2-3 and L4-5 and bilaterally at L5-S1. I think the central canal is probably sufficiently decompressed through this region. No compressive foraminal stenosis demonstrated.  Facet osteoarthritis at L3-4, L4-5 and L5-S1. 3 mm anterolisthesis at L4-5.  Sacroiliac osteoarthritis.     Objective:  VS:  HT:    WT:   BMI:     BP:   HR: bpm  TEMP: ( )  RESP:  Physical Exam  Ortho Exam Imaging: Xr C-arm No Report  Result Date: 06/13/2018 Please see Notes tab for imaging impression.

## 2018-06-13 NOTE — Patient Instructions (Signed)

## 2018-06-13 NOTE — Progress Notes (Signed)
 .  Numeric Pain Rating Scale and Functional Assessment Average Pain 8   In the last MONTH (on 0-10 scale) has pain interfered with the following?  1. General activity like being  able to carry out your everyday physical activities such as walking, climbing stairs, carrying groceries, or moving a chair?  Rating(7)   -Dye Allergies.  

## 2018-06-14 MED ORDER — TRIAMCINOLONE ACETONIDE 40 MG/ML IJ SUSP
60.0000 mg | INTRAMUSCULAR | Status: AC | PRN
  Administered 2018-06-13: 60 mg via INTRA_ARTICULAR

## 2018-06-14 MED ORDER — BUPIVACAINE HCL 0.25 % IJ SOLN
4.0000 mL | INTRAMUSCULAR | Status: AC | PRN
  Administered 2018-06-13: 4 mL via INTRA_ARTICULAR

## 2018-06-17 ENCOUNTER — Other Ambulatory Visit: Payer: Self-pay | Admitting: Rheumatology

## 2018-06-18 ENCOUNTER — Telehealth (INDEPENDENT_AMBULATORY_CARE_PROVIDER_SITE_OTHER): Payer: Self-pay | Admitting: Specialist

## 2018-06-18 NOTE — Telephone Encounter (Signed)
Please schedule patient for a new patient follow up . Patient was due January 2020. Thanks!

## 2018-06-18 NOTE — Telephone Encounter (Signed)
LMOM for patient to call and schedule her follow-up appointment. °

## 2018-06-18 NOTE — Telephone Encounter (Signed)
Last Visit: 11/14/17 Next Visit: due in January 2020. Message sent to the front to schedule patient.  Labs: 06/07/18  Creat. 0.95 GFR 57  Okay to refill per Dr. Estanislado Pandy

## 2018-06-18 NOTE — Telephone Encounter (Signed)
Patient called wanting Dr Louanne Skye to know that the injection helped in her leg and some in her back. Patient said she don't have an appointment scheduled and asked if she needed one. The number to contact patient is [phone]

## 2018-06-18 NOTE — Telephone Encounter (Signed)
Per Dr. Louanne Skye, sounds like hip injection helped, so sounds like its a hip problem, if pain returns we may need to send her to see Dr. Ninfa Linden to discuss hip replacement.

## 2018-06-19 DIAGNOSIS — G894 Chronic pain syndrome: Secondary | ICD-10-CM | POA: Diagnosis not present

## 2018-06-19 DIAGNOSIS — M961 Postlaminectomy syndrome, not elsewhere classified: Secondary | ICD-10-CM | POA: Diagnosis not present

## 2018-06-19 DIAGNOSIS — M4726 Other spondylosis with radiculopathy, lumbar region: Secondary | ICD-10-CM | POA: Diagnosis not present

## 2018-06-19 DIAGNOSIS — R110 Nausea: Secondary | ICD-10-CM | POA: Diagnosis not present

## 2018-06-19 NOTE — Telephone Encounter (Signed)
I called and advised patient of what Dr. Louanne Skye said she understands and will call us back when her pain begins to return

## 2018-06-20 ENCOUNTER — Other Ambulatory Visit: Payer: Self-pay | Admitting: Rheumatology

## 2018-06-20 ENCOUNTER — Telehealth: Payer: Self-pay | Admitting: Rheumatology

## 2018-06-20 NOTE — Telephone Encounter (Signed)
Patient called stating she was told her prescription of Fosamax would be sent to [facility]  Patient states she called 30 minutes ago and they have not received the prescription.  Patient states she only has "a couple of pills left."

## 2018-06-20 NOTE — Telephone Encounter (Signed)
Spoke with patient and advised her prescription refill was sent to the pharmacy on 06/18/18. Patient states she will contact the pharmacy.

## 2018-06-21 ENCOUNTER — Telehealth (INDEPENDENT_AMBULATORY_CARE_PROVIDER_SITE_OTHER): Payer: Self-pay | Admitting: Specialist

## 2018-06-21 NOTE — Progress Notes (Deleted)
Office Visit Note  Patient: [name] [name]             Date of Birth: October 15, 1935           MRN: [id number]             PCP: [name], MD Referring: [name], MD Visit Date: 07/05/2018 Occupation: @GUAROCC @  Subjective:  No chief complaint on file.  Fosamax 70 mg weekly (started in June 2016).  She has a history of noncompliance in 2 to GI upset and joint pain.  Offered patient Tymlos but declined due to medication risk.  She has had multiple lumbar vertebral plasties/kyphoplasty's and last procedure was done on 11/03/17. DXA from 01/28/15 revealed: BMD; 0.670 T-score:-2.6.  Her DXA on 3/18 was performed on a different scanner and revealed: BMD: 0.652 T-score -2.8. Last vitamin D level 74 on 03/02/18.  History of Present Illness: [name] [name] is a 83 y.o. female ***   Activities of Daily Living:  Patient reports morning stiffness for *** {minute/hour:19697}.   Patient {ACTIONS;DENIES/REPORTS:21021675::"Denies"} nocturnal pain.  Difficulty dressing/grooming: {ACTIONS;DENIES/REPORTS:21021675::"Denies"} Difficulty climbing stairs: {ACTIONS;DENIES/REPORTS:21021675::"Denies"} Difficulty getting out of chair: {ACTIONS;DENIES/REPORTS:21021675::"Denies"} Difficulty using hands for taps, buttons, cutlery, and/or writing: {ACTIONS;DENIES/REPORTS:21021675::"Denies"}  No Rheumatology ROS completed.   PMFS History:  Patient Active Problem List   Diagnosis Date Noted  . Nausea & vomiting 05/13/2018  . Unresponsiveness 04/14/2018  . Hyperkalemia 04/13/2018  . FHx: heart disease 03/01/2018  . Therapeutic opioid-induced constipation (OIC) 12/21/2017  . Abnormal UGI series 12/21/2017  . CKD (chronic kidney disease) stage 3, GFR 30-59 ml/min (HCC) 09/27/2017  . T12 compression fracture ([place]) 09/26/2017  . Chronic bronchitis ([place]) 08/09/2017  . Atherosclerosis of aorta ([place]) 04/05/2017  . Anxiety state 06/08/2016  . Spinal stenosis, lumbar region, with neurogenic claudication  05/29/2015  . DDD (degenerative disc disease), lumbar 04/21/2015  . Hyperlipidemia, mixed 11/24/2014  . Gastroesophageal reflux disease 11/24/2014  . Depression, major, in partial remission ([place]) 09/26/2014  . Chronic pain syndrome 09/26/2014  . Coronary artery disease due to lipid rich plaque   . Osteoporosis 02/10/2014  . Prediabetes 08/14/2013  . Vitamin D deficiency 08/14/2013  . Medication management 08/14/2013  . MGUS (monoclonal gammopathy of unknown significance) 03/05/2013  . Vertebral compression fracture ([place]) 06/23/2012  . ESOPHAGEAL STRICTURE 08/28/2008  . Mild intermittent asthma without complication 70/35/0093  . Incarcerated recurrent paraesophageal hiatal hernia s/p lap redo repair [id number] 01/31/2008  . Essential hypertension 07/25/2007    Past Medical History:  Diagnosis Date  . Anxiety    takes Xanax daily as needed  . Asthma    Albuterol daily as needed  . Blood transfusion without reported diagnosis   . CAD (coronary artery disease)    LHC 2003 with 40% pLAD, 30% mLAD, 100% D1 (moderate vessel), 100%  D2 (small vessel), 95% mid small OM2.  Pt had PTCA to D1;  D2 and OM2 small vessels and tx medically;  Last Myoview (2012): anterior infarct seen with scar, no ischemia. EF normal. Med Rx // Myoview (12/14):  Low risk; ant scar w/ peri-infarct ischemia; EF 55% with ant AK // Myoview 5/16: EF 55-65, ant, apical scar, no ischemia   . Cataract   . Chronic back pain    HNP, DDD, spinal stenosis, vertebral compression fractures.   . Chronic nausea    takes Zofran daily as needed.  normal gastric emptying study 03/2013  . CKD (chronic kidney disease), stage III ([place]) 09/27/2017  . Constipation    felt to  be functional. takes Senokot and Miralax daily.  treated with Linzess in past.   . DIVERTICULOSIS, COLON 08/28/2008   Qualifier: Diagnosis of  By: [name], [name], [name]    . Elevated LFTs 2015   probably med induced DILI, from Augmentin vs Pravastatin  . GERD  (gastroesophageal reflux disease)    hx of esophageal stricture   . Hiatal hernia    Paraesophageal hernias. s/p fundoplications in 6761 and 04/2013  . History of bronchitis several of yrs ago  . History of colon polyps 03/2009, 03/2011   adenomatous colon polyps, no high grade dysplasia.   [name] History of vertigo    no meds  . Hyperlipidemia    takes Pravastatin daily  . Hypertension    takes Amlodipine,Metoprolol,and Losartan daily  . Nausea 03/2016  . Osteoporosis   . PONV (postoperative nausea and vomiting)    yrs ago  . Slow urinary stream    takes Rapaflo daily  . TIA (transient ischemic attack) 1995   no residual effects noted    Family History  Problem Relation Age of Onset  . Colon cancer Mother 61  . Heart attack Father 37       heart attack  . Brain cancer Brother        tumor   . Heart disease Brother   . Heart disease Brother   . Kidney disease Daughter   . Esophageal cancer Neg Hx   . Stomach cancer Neg Hx    Past Surgical History:  Procedure Laterality Date  . APPENDECTOMY     patient unsure of date  . BACK SURGERY    . BLADDER REPAIR    . CHOLECYSTECTOMY     Patient unsure of date.  . CORONARY ANGIOPLASTY  11/16/2001   1 stent  . ESOPHAGEAL MANOMETRY N/A 03/25/2013   Procedure: ESOPHAGEAL MANOMETRY (EM);  Surgeon: Inda Castle, MD;  Location: [facility];  Service: Endoscopy;  Laterality: N/A;  . ESOPHAGOGASTRODUODENOSCOPY N/A 03/15/2013   Procedure: ESOPHAGOGASTRODUODENOSCOPY (EGD);  Surgeon: Jerene Bears, MD;  Location[facility] ENDOSCOPY;  Service: Endoscopy;  Laterality: N/A;  . ESOPHAGOGASTRODUODENOSCOPY N/A 12/25/2015   Procedure: ESOPHAGOGASTRODUODENOSCOPY (EGD);  Surgeon: Doran Stabler, MD;  Location: [facility] ENDOSCOPY;  Service: Endoscopy;  Laterality: N/A;  . EYE SURGERY  11/2000   bilateral cataracts with lens implant  . FIXATION KYPHOPLASTY LUMBAR SPINE  X 2   "L3-4"  . HEMORRHOID SURGERY    . HIATAL HERNIA REPAIR  06/03/2011   Procedure:  LAPAROSCOPIC REPAIR OF HIATAL HERNIA;  Surgeon: Adin Hector, MD;  Location: [facility] OR[facility];  Service: General;  Laterality: N/A;  . HIATAL HERNIA REPAIR N/A 04/24/2013   Procedure: LAPAROSCOPIC  REPAIR RECURRENT PARASOPHAGEAL HIATAL HERNIA WITH FUNDOPLICATION;  Surgeon: Adin Hector, MD;  Location: [facility];  Service: General;  Laterality: N/A;  . INSERTION OF MESH N/A 04/24/2013   Procedure: INSERTION OF MESH;  Surgeon: Adin Hector, MD;  Location: [facility];  Service: General;  Laterality: N/A;  . IR KYPHO THORACIC WITH BONE BIOPSY  09/29/2017  . IR KYPHO THORACIC WITH BONE BIOPSY  11/03/2017  . LAPAROSCOPIC LYSIS OF ADHESIONS N/A 04/24/2013   Procedure: LAPAROSCOPIC LYSIS OF ADHESIONS;  Surgeon: Adin Hector, MD;  Location: [facility];  Service: General;  Laterality: N/A;  . LAPAROSCOPIC NISSEN FUNDOPLICATION  9/50/9326   Procedure: LAPAROSCOPIC NISSEN FUNDOPLICATION;  Surgeon: Adin Hector, MD;  Location: [facility];  Service: General;  Laterality: N/A;  . LUMBAR FUSION  12/07/2015  Right L2-3 facetectomy, posterolateral fusion L2-3 with pedicle screws, rods, local bone graft, Vivigen cancellous chips. Fusion extended to the L1 level with pedicle screws and rods  . LUMBAR LAMINECTOMY/DECOMPRESSION MICRODISCECTOMY Right 06/26/2012   Procedure: LUMBAR LAMINECTOMY/DECOMPRESSION MICRODISCECTOMY;  Surgeon: Jessy Oto, MD;  Location: [facility];  Service: Orthopedics;  Laterality: Right;  RIGHT L4-5 MICRODISCECTOMY  . LUMBAR LAMINECTOMY/DECOMPRESSION MICRODISCECTOMY N/A 05/29/2015   Procedure: RIGHT L2-3 MICRODISCECTOMY;  Surgeon: Jessy Oto, MD;  Location: [facility];  Service: Orthopedics;  Laterality: N/A;  . TOTAL ABDOMINAL HYSTERECTOMY  1975   partial  . UPPER GASTROINTESTINAL ENDOSCOPY     Social History   Social History Narrative   Lives with husband.   Right-handed.   3 cups caffeine per day.   Immunization History  Administered Date(s) Administered  . DT 05/06/2014  . Influenza Split  02/07/2011  . Influenza Whole 02/09/2009, 01/19/2010, 02/13/2012  . Influenza, High Dose Seasonal PF 03/22/2013, 01/15/2014, 02/27/2015, 01/14/2016, 01/18/2017, 02/14/2018  . Pneumococcal Conjugate-13 05/06/2014  . Pneumococcal Polysaccharide-23 02/09/2011  . Pneumococcal-Unspecified 02/06/2010, 02/09/2012  . Td 05/10/2003     Objective: Vital Signs: There were no vitals taken for this visit.   Physical Exam   Musculoskeletal Exam: ***  CDAI Exam: CDAI Score: Not documented Patient Global Assessment: Not documented; Provider Global Assessment: Not documented Swollen: Not documented; Tender: Not documented Joint Exam   Not documented   There is currently no information documented on the homunculus. Go to the Rheumatology activity and complete the homunculus joint exam.  Investigation: No additional findings.  Imaging: Xr C-arm No Report  Result Date: 06/13/2018 Please see Notes tab for imaging impression.   Recent Labs: Lab Results  Component Value Date   WBC 8.2 06/07/2018   HGB 14.6 06/07/2018   PLT 382 06/07/2018   NA 141 06/07/2018   K 4.4 06/07/2018   CL 104 06/07/2018   CO2 25 06/07/2018   GLUCOSE 109 (H) 06/07/2018   BUN 20 06/07/2018   CREATININE 0.95 (H) 06/07/2018   BILITOT 0.7 06/07/2018   ALKPHOS 37 (L) 05/15/2018   AST 13 06/07/2018   ALT 15 06/07/2018   PROT 7.3 06/07/2018   ALBUMIN 3.0 (L) 05/15/2018   CALCIUM 10.1 06/07/2018   GFRAA 66 06/07/2018    Speciality Comments: No specialty comments available.  Procedures:  No procedures performed Allergies: Ace inhibitors; Aspartame; Atorvastatin; Biaxin [clarithromycin]; Daliresp [roflumilast]; Erythromycin; and Levofloxacin   Assessment / Plan:     Visit Diagnoses: No diagnosis found.   Orders: No orders of the defined types were placed in this encounter.  No orders of the defined types were placed in this encounter.   Face-to-face time spent with patient was *** minutes. Greater than  50% of time was spent in counseling and coordination of care.  Follow-Up Instructions: No follow-ups on file.   Earnestine Mealing, CMA  Note - This record has been created using Editor, commissioning.  Chart creation errors have been sought, but may not always  have been located. Such creation errors do not reflect on  the standard of medical care.

## 2018-06-21 NOTE — Telephone Encounter (Signed)
Patient called advised her legs are hurting so bad that she can not sleep at night. Patient said she was suppose to be referred to Dr. Ninfa Linden for her hip. The number to contact patient is [phone]

## 2018-06-22 NOTE — Telephone Encounter (Signed)
sched for 07/09/2018 at [place]

## 2018-06-22 NOTE — Telephone Encounter (Signed)
I scheduled her to See Dr. Ninfa Linden to Eval for right hip replacement

## 2018-06-28 ENCOUNTER — Ambulatory Visit: Payer: PPO | Admitting: Rheumatology

## 2018-06-28 ENCOUNTER — Other Ambulatory Visit: Payer: Self-pay | Admitting: Physician Assistant

## 2018-06-28 ENCOUNTER — Telehealth: Payer: Self-pay | Admitting: Rheumatology

## 2018-06-28 DIAGNOSIS — H8110 Benign paroxysmal vertigo, unspecified ear: Secondary | ICD-10-CM

## 2018-06-28 NOTE — Telephone Encounter (Signed)
Patient called stating she went to [facility] yesterday to pick up her prescription of Fosamax and was told they haven't received a refill request.  Patient states she spoke with Seth Bake on 06/20/18 who told her the prescription was sent to the pharmacy.  Patient requested a return call.

## 2018-06-28 NOTE — Telephone Encounter (Signed)
[facility] has received prescription, I called to confirm. They will be getting the rx ready for patient today. I attempted to contact patient and left message on machine to advise patient.

## 2018-07-05 ENCOUNTER — Ambulatory Visit: Payer: PPO | Admitting: Rheumatology

## 2018-07-06 NOTE — Progress Notes (Signed)
Office Visit Note  Patient: [name] [name]             Date of Birth: [birth year]           MRN: [id number]             PCP: [name], MD Referring: [name], MD Visit Date: 07/12/2018 Occupation: @GUAROCC @  Subjective:  Medication monitoring   History of Present Illness: [name] [name] is a 83 y.o. female with history of osteoporosis, osteoarthritis, and DDD.  She is taking Fosamax 70 mg po once weekly for management of osteoporosis. She is tolerating fosamax.  She has not missed any doses. She is taking a calcium and vitamin D supplement.  She is taking 10,000 units of vitamin D daily. She denies any recent fractures.  She continues to follow up with Dr. Louanne Skye.  She has chronic lower back pain.  She walks with a cane. She denies any new health issues.  Activities of Daily Living:  Patient reports morning stiffness for 0  minutes.   Patient Denies nocturnal pain.  Difficulty dressing/grooming: Denies Difficulty climbing stairs: Denies Difficulty getting out of chair: Denies Difficulty using hands for taps, buttons, cutlery, and/or writing: Denies  Review of Systems  Constitutional: Positive for fatigue.  HENT: Negative for mouth sores, mouth dryness and nose dryness.   Eyes: Positive for dryness (use Refresh). Negative for pain and visual disturbance.  Respiratory: Negative for cough, hemoptysis, shortness of breath and difficulty breathing.   Cardiovascular: Negative for chest pain, palpitations, hypertension and swelling in legs/feet.  Gastrointestinal: Negative for blood in stool, constipation and diarrhea.  Endocrine: Negative for increased urination.  Genitourinary: Negative for painful urination.  Musculoskeletal: Positive for arthralgias and joint pain. Negative for joint swelling, myalgias, muscle weakness, morning stiffness, muscle tenderness and myalgias.  Skin: Negative for color change, pallor, rash, hair loss, nodules/bumps, skin tightness, ulcers and  sensitivity to sunlight.  Allergic/Immunologic: Negative for susceptible to infections.  Neurological: Negative for dizziness, numbness, headaches and weakness.  Hematological: Negative for swollen glands.  Psychiatric/Behavioral: Negative for depressed mood and sleep disturbance. The patient is not nervous/anxious.     PMFS History:  Patient Active Problem List   Diagnosis Date Noted  . Nausea & vomiting 05/13/2018  . Unresponsiveness 04/14/2018  . Hyperkalemia 04/13/2018  . FHx: heart disease 03/01/2018  . Therapeutic opioid-induced constipation (OIC) 12/21/2017  . Abnormal UGI series 12/21/2017  . CKD (chronic kidney disease) stage 3, GFR 30-59 ml/min (HCC) 09/27/2017  . T12 compression fracture ([place]) 09/26/2017  . Chronic bronchitis ([place]) 08/09/2017  . Atherosclerosis of aorta ([place]) 04/05/2017  . Anxiety state 06/08/2016  . Spinal stenosis, lumbar region, with neurogenic claudication 05/29/2015  . DDD (degenerative disc disease), lumbar 04/21/2015  . Hyperlipidemia, mixed 11/24/2014  . Gastroesophageal reflux disease 11/24/2014  . Depression, major, in partial remission ([place]) 09/26/2014  . Chronic pain syndrome 09/26/2014  . Coronary artery disease due to lipid rich plaque   . Osteoporosis 02/10/2014  . Prediabetes 08/14/2013  . Vitamin D deficiency 08/14/2013  . Medication management 08/14/2013  . MGUS (monoclonal gammopathy of unknown significance) 03/05/2013  . Vertebral compression fracture ([place]) 06/23/2012  . ESOPHAGEAL STRICTURE 08/28/2008  . Mild intermittent asthma without complication 08/67/6195  . Incarcerated recurrent paraesophageal hiatal hernia s/p lap redo repair [id number] 01/31/2008  . Essential hypertension 07/25/2007    Past Medical History:  Diagnosis Date  . Anxiety    takes Xanax daily as needed  . Asthma  Albuterol daily as needed  . Blood transfusion without reported diagnosis   . CAD (coronary artery disease)    LHC 2003 with 40% pLAD, 30%  mLAD, 100% D1 (moderate vessel), 100%  D2 (small vessel), 95% mid small OM2.  Pt had PTCA to D1;  D2 and OM2 small vessels and tx medically;  Last Myoview (2012): anterior infarct seen with scar, no ischemia. EF normal. Med Rx // Myoview (12/14):  Low risk; ant scar w/ peri-infarct ischemia; EF 55% with ant AK // Myoview 5/16: EF 55-65, ant, apical scar, no ischemia   . Cataract   . Chronic back pain    HNP, DDD, spinal stenosis, vertebral compression fractures.   . Chronic nausea    takes Zofran daily as needed.  normal gastric emptying study 03/2013  . CKD (chronic kidney disease), stage III ([place]) 09/27/2017  . Constipation    felt to be functional. takes Senokot and Miralax daily.  treated with Linzess in past.   . DIVERTICULOSIS, COLON 08/28/2008   Qualifier: Diagnosis of  By: [name], [name], [name]    . Elevated LFTs 2015   probably med induced DILI, from Augmentin vs Pravastatin  . GERD (gastroesophageal reflux disease)    hx of esophageal stricture   . Hiatal hernia    Paraesophageal hernias. s/p fundoplications in 2778 and 04/2013  . History of bronchitis several of yrs ago  . History of colon polyps 03/2009, 03/2011   adenomatous colon polyps, no high grade dysplasia.   [name] History of vertigo    no meds  . Hyperlipidemia    takes Pravastatin daily  . Hypertension    takes Amlodipine,Metoprolol,and Losartan daily  . Nausea 03/2016  . Osteoporosis   . PONV (postoperative nausea and vomiting)    yrs ago  . Slow urinary stream    takes Rapaflo daily  . TIA (transient ischemic attack) 1995   no residual effects noted    Family History  Problem Relation Age of Onset  . Colon cancer Mother 45  . Heart attack Father 65       heart attack  . Brain cancer Brother        tumor   . Heart disease Brother   . Heart disease Brother   . Kidney disease Daughter   . Esophageal cancer Neg Hx   . Stomach cancer Neg Hx    Past Surgical History:  Procedure Laterality Date  .  APPENDECTOMY     patient unsure of date  . BACK SURGERY    . BLADDER REPAIR    . CHOLECYSTECTOMY     Patient unsure of date.  . CORONARY ANGIOPLASTY  11/16/2001   1 stent  . ESOPHAGEAL MANOMETRY N/A 03/25/2013   Procedure: ESOPHAGEAL MANOMETRY (EM);  Surgeon: Inda Castle, MD;  Location: [facility];  Service: Endoscopy;  Laterality: N/A;  . ESOPHAGOGASTRODUODENOSCOPY N/A 03/15/2013   Procedure: ESOPHAGOGASTRODUODENOSCOPY (EGD);  Surgeon: Jerene Bears, MD;  Location: [facility] ENDOSCOPY;  Service: Endoscopy;  Laterality: N/A;  . ESOPHAGOGASTRODUODENOSCOPY N/A 12/25/2015   Procedure: ESOPHAGOGASTRODUODENOSCOPY (EGD);  Surgeon: Doran Stabler, MD;  Location: [facility] ENDOSCOPY;  Service: Endoscopy;  Laterality: N/A;  . EYE SURGERY  11/2000   bilateral cataracts with lens implant  . FIXATION KYPHOPLASTY LUMBAR SPINE  X 2   "L3-4"  . HEMORRHOID SURGERY    . HIATAL HERNIA REPAIR  06/03/2011   Procedure: LAPAROSCOPIC REPAIR OF HIATAL HERNIA;  Surgeon: Adin Hector, MD;  Location: [facility]S;  Service:  General;  Laterality: N/A;  . HIATAL HERNIA REPAIR N/A 04/24/2013   Procedure: LAPAROSCOPIC  REPAIR RECURRENT PARASOPHAGEAL HIATAL HERNIA WITH FUNDOPLICATION;  Surgeon: Adin Hector, MD;  Location: [facility];  Service: General;  Laterality: N/A;  . INSERTION OF MESH N/A 04/24/2013   Procedure: INSERTION OF MESH;  Surgeon: Adin Hector, MD;  Location: [facility];  Service: General;  Laterality: N/A;  . IR KYPHO THORACIC WITH BONE BIOPSY  09/29/2017  . IR KYPHO THORACIC WITH BONE BIOPSY  11/03/2017  . LAPAROSCOPIC LYSIS OF ADHESIONS N/A 04/24/2013   Procedure: LAPAROSCOPIC LYSIS OF ADHESIONS;  Surgeon: Adin Hector, MD;  Location: [facility];  Service: General;  Laterality: N/A;  . LAPAROSCOPIC NISSEN FUNDOPLICATION  3/38/2505   Procedure: LAPAROSCOPIC NISSEN FUNDOPLICATION;  Surgeon: Adin Hector, MD;  Location: [facility];  Service: General;  Laterality: N/A;  . LUMBAR FUSION  12/07/2015   Right L2-3  facetectomy, posterolateral fusion L2-3 with pedicle screws, rods, local bone graft, Vivigen cancellous chips. Fusion extended to the L1 level with pedicle screws and rods  . LUMBAR LAMINECTOMY/DECOMPRESSION MICRODISCECTOMY Right 06/26/2012   Procedure: LUMBAR LAMINECTOMY/DECOMPRESSION MICRODISCECTOMY;  Surgeon: Jessy Oto, MD;  Location: [place];  Service: Orthopedics;  Laterality: Right;  RIGHT L4-5 MICRODISCECTOMY  . LUMBAR LAMINECTOMY/DECOMPRESSION MICRODISCECTOMY N/A 05/29/2015   Procedure: RIGHT L2-3 MICRODISCECTOMY;  Surgeon: Jessy Oto, MD;  Location: [place];  Service: Orthopedics;  Laterality: N/A;  . TOTAL ABDOMINAL HYSTERECTOMY  1975   partial  . UPPER GASTROINTESTINAL ENDOSCOPY     Social History   Social History Narrative   Lives with husband.   Right-handed.   3 cups caffeine per day.   Immunization History  Administered Date(s) Administered  . DT 05/06/2014  . Influenza Split 02/07/2011  . Influenza Whole 02/09/2009, 01/19/2010, 02/13/2012  . Influenza, High Dose Seasonal PF 03/22/2013, 01/15/2014, 02/27/2015, 01/14/2016, 01/18/2017, 02/14/2018  . Pneumococcal Conjugate-13 05/06/2014  . Pneumococcal Polysaccharide-23 02/09/2011  . Pneumococcal-Unspecified 02/06/2010, 02/09/2012  . Td 05/10/2003     Objective: Vital Signs: Resp 14   Ht 5\' 2"  (1.575 m)   Wt 170 lb 9.6 oz (77.4 kg)   BMI 31.20 kg/m    Physical Exam Vitals signs and nursing note reviewed.  Constitutional:      Appearance: She is well-developed.  HENT:     Head: Normocephalic and atraumatic.  Eyes:     Conjunctiva/sclera: Conjunctivae normal.  Neck:     Musculoskeletal: Normal range of motion.  Cardiovascular:     Rate and Rhythm: Normal rate and regular rhythm.     Heart sounds: Normal heart sounds.  Pulmonary:     Effort: Pulmonary effort is normal.     Breath sounds: Normal breath sounds.  Abdominal:     General: Bowel sounds are normal.     Palpations: Abdomen is soft.    Lymphadenopathy:     Cervical: No cervical adenopathy.  Skin:    General: Skin is warm and dry.     Capillary Refill: Capillary refill takes less than 2 seconds.  Neurological:     Mental Status: She is alert and oriented to person, place, and time.  Psychiatric:        Behavior: Behavior normal.      Musculoskeletal Exam: C-spine good ROM.  Limited ROM of lumbar spine.  Thoracic kyphosis and scoliosis. Midline spinal tenderness in lumbar region. Shoulder joints, elbow joints, wrist joints, MCPs, PIPs, and DIPs good ROM with no synovitis.  PIP and DIP synovial  thickening consistent with osteoarthritis.  She has complete fist formation bilaterally. Slightly limited ROM of hip joints.  Knee joints, ankle joints, MTPs, PIPs ,and DIPs good ROM with no synovitis.  No warmth or effusion of knee joints.  No tenderness or swelling of ankle joints.   CDAI Exam: CDAI Score: Not documented Patient Global Assessment: Not documented; Provider Global Assessment: Not documented Swollen: Not documented; Tender: Not documented Joint Exam   Not documented   There is currently no information documented on the homunculus. Go to the Rheumatology activity and complete the homunculus joint exam.  Investigation: No additional findings.  Imaging: Xr C-arm No Report  Result Date: 06/13/2018 Please see Notes tab for imaging impression.  Xr Hip Unilat W Or W/o Pelvis 1v Right  Result Date: 07/09/2018 An AP pelvis and lateral of the right hip shows no acute findings.  The joint space is well-maintained and the hip ball and socket are congruent.   Recent Labs: Lab Results  Component Value Date   WBC 8.2 06/07/2018   HGB 14.6 06/07/2018   PLT 382 06/07/2018   NA 141 06/07/2018   K 4.4 06/07/2018   CL 104 06/07/2018   CO2 25 06/07/2018   GLUCOSE 109 (H) 06/07/2018   BUN 20 06/07/2018   CREATININE 0.95 (H) 06/07/2018   BILITOT 0.7 06/07/2018   ALKPHOS 37 (L) 05/15/2018   AST 13 06/07/2018   ALT  15 06/07/2018   PROT 7.3 06/07/2018   ALBUMIN 3.0 (L) 05/15/2018   CALCIUM 10.1 06/07/2018   GFRAA 66 06/07/2018    Speciality Comments: No specialty comments available.  Procedures:  No procedures performed Allergies: Ace inhibitors; Aspartame; Atorvastatin; Biaxin [clarithromycin]; Daliresp [roflumilast]; Erythromycin; and Levofloxacin   Assessment / Plan:     Visit Diagnoses: Age-related osteoporosis without current pathological fracture -She is taking Fosamax 70 mg po once weekly.  She has been tolerating fosamax and has not missed any doses recently. A refill was sent to the pharmacy.  She is taking vitamin D 5,000 units BID and is taking a calcium supplement. She denies any recent fractures or falls.  She continues to walk with a cane. Her most recent DEXA on 3/18 was performed on a different scanner and revealed: BMD: 0.652 T-score -2.8.  She is due for her next DEXA and was advised to have PCP order this since her last DEXA was at the breast center. She will follow up in 6 months.   History of vitamin D deficiency: She is taking Vitamin D 5,000 units BID.   History of vertebral compression fracture - multiple lumbar vertebral plasties/kyphoplasty's.  Her most recent was on 11/03/2017.  Cement was injected as well as a biopsy was performed.  The biopsy was negative.    Primary osteoarthritis of both knees: No warmth or effusion. She has good ROM with no discomfort.  She walks with a cane.   DDD (degenerative disc disease), lumbar -She has limited ROM with discomfort. She is followed by Dr. Louanne Skye.    Facet arthropathy: She has chronic lower back pain.   H/O kyphoplasty: She has not had any recent vertebral fractures.   Abnormal laboratory test result - IFE. followed by hematology.  She had multiple lumbar vertebral plasties and kyphoplasty's.  She had kyphoplasty and biopsy performed on 10/26/2017.     Other medical conditions are listed as follows:   History of gastroesophageal  reflux (GERD)  History of hypercholesterolemia  History of asthma  History of cholecystectomy  History of  appendectomy  History of hypertension  History of anxiety - Takes Xanax PRN.  History of neuropathy     Orders: No orders of the defined types were placed in this encounter.  Meds ordered this encounter  Medications  . alendronate (FOSAMAX) 70 MG tablet    Sig: TAKE 1 TABLET BY MOUTH ONCE A WEEK. TAKE WITH A FULL GLASS OF WATER ON AN EMPTY STOMACH.    Dispense:  12 tablet    Refill:  0      Follow-Up Instructions: Return in about 6 months (around 01/12/2019) for Osteoporosis, Osteoarthritis, DDD.   Ofilia Neas, PA-C I examined and evaluated the patient with [name] Sams PA.  Patient has been tolerating Fosamax well.  She will continue Fosamax for right now.  She will get bone density this year so we can decide on continuation of the medication.  She had no synovitis on examination today.  She continues to have some lower back pain for which she is been seeing Dr. Louanne Skye.  The plan of care was discussed as noted above.  [name], MD Note - This record has been created using Editor, commissioning.  Chart creation errors have been sought, but may not always  have been located. Such creation errors do not reflect on  the standard of medical care.

## 2018-07-09 ENCOUNTER — Ambulatory Visit (INDEPENDENT_AMBULATORY_CARE_PROVIDER_SITE_OTHER): Payer: PPO

## 2018-07-09 ENCOUNTER — Encounter (INDEPENDENT_AMBULATORY_CARE_PROVIDER_SITE_OTHER): Payer: Self-pay | Admitting: Orthopaedic Surgery

## 2018-07-09 ENCOUNTER — Ambulatory Visit (INDEPENDENT_AMBULATORY_CARE_PROVIDER_SITE_OTHER): Payer: PPO | Admitting: Orthopaedic Surgery

## 2018-07-09 VITALS — Ht 63.0 in | Wt 163.0 lb

## 2018-07-09 DIAGNOSIS — M25551 Pain in right hip: Secondary | ICD-10-CM | POA: Diagnosis not present

## 2018-07-09 NOTE — Progress Notes (Signed)
Office Visit Note   Patient: [name] [name]           Date of Birth: 1935/09/12           MRN: [id number] Visit Date: 07/09/2018              Requested by: [name], [address] [place], [place] [address] PCP: [name], MD   Assessment & Plan: Visit Diagnoses:  1. Pain in right hip     Plan: Based on her x-rays, her CT scan of her pelvis showing her right hip as well as her clinical exam I do not feel that she needs a hip replacement.  Certainly if her issues worsen anyway we can obtain an MRI of her right hip looking at all the muscles and everything else in the groin area to see if there is a reason for her to be hurting when she is but I do not feel that a hip replacement is warranted.  I asked her if she would like me to obtain an MRI right now she is just going to watch things and if it worsens anyway she will let us know.  Follow-up is as needed.  All question concerns were answered and addressed.  Follow-Up Instructions: Return if symptoms worsen or fail to improve.   Orders:  Orders Placed This Encounter  Procedures  . XR HIP UNILAT W OR W/O PELVIS 1V RIGHT   No orders of the defined types were placed in this encounter.     Procedures: No procedures performed   Clinical Data: No additional findings.   Subjective: Chief Complaint  Patient presents with  . Right Hip - Pain  Patient is a very pleasant 83 year old female who have actually taken care of her son several times.  She comes in for second opinion as it relates to her right hip and whether or not she may need hip replacement.  She is recently had intra-articular steroid injection of the right hip and it did help some of the pain but it really took care of pain going down her leg she states.  She ambulates with a cane.  She does point to her groin area but some of her lower abdomen is a source of her pain.  She actually has a CT scan on the [facility] system from January of this  year and I was able to see her hips easily.  Her pain is not severe but it is aggravating to her she states.  She does again use a cane as she ambulates to offload her hip.  She is had extensive spine surgery in the past.  HPI  Review of Systems She currently denies any headache, chest pain, shortness of breath, fever, chills, nausea, vomiting  Objective: Vital Signs: Ht 5\' 3"  (1.6 m)   Wt 163 lb (73.9 kg)   BMI 28.87 kg/m   Physical Exam She is alert and orient x3 and in no acute distress Ortho Exam Examination of her right hip and left hip shows that they both move fluidly with full internal and external rotation with no significant discomfort on exam today at all. Specialty Comments:  No specialty comments available.  Imaging: Xr Hip Unilat W Or W/o Pelvis 1v Right  Result Date: 07/09/2018 An AP pelvis and lateral of the right hip shows no acute findings.  The joint space is well-maintained and the hip ball and socket are congruent.  A CT scan from earlier this year was  independently reviewed you can see her right hip is normal in the AP, coronal and sagittal planes with no acute findings and no significant arthritic changes.  PMFS History: Patient Active Problem List   Diagnosis Date Noted  . Nausea & vomiting 05/13/2018  . Unresponsiveness 04/14/2018  . Hyperkalemia 04/13/2018  . FHx: heart disease 03/01/2018  . Therapeutic opioid-induced constipation (OIC) 12/21/2017  . Abnormal UGI series 12/21/2017  . CKD (chronic kidney disease) stage 3, GFR 30-59 ml/min (HCC) 09/27/2017  . T12 compression fracture ([place]) 09/26/2017  . Chronic bronchitis ([place]) 08/09/2017  . Atherosclerosis of aorta ([place]) 04/05/2017  . Anxiety state 06/08/2016  . Spinal stenosis, lumbar region, with neurogenic claudication 05/29/2015  . DDD (degenerative disc disease), lumbar 04/21/2015  . Hyperlipidemia, mixed 11/24/2014  . Gastroesophageal reflux disease 11/24/2014  . Depression, major, in partial  remission ([place]) 09/26/2014  . Chronic pain syndrome 09/26/2014  . Coronary artery disease due to lipid rich plaque   . Osteoporosis 02/10/2014  . Prediabetes 08/14/2013  . Vitamin D deficiency 08/14/2013  . Medication management 08/14/2013  . MGUS (monoclonal gammopathy of unknown significance) 03/05/2013  . Vertebral compression fracture ([place]) 06/23/2012  . ESOPHAGEAL STRICTURE 08/28/2008  . Mild intermittent asthma without complication 39/76/7341  . Incarcerated recurrent paraesophageal hiatal hernia s/p lap redo repair [id number] 01/31/2008  . Essential hypertension 07/25/2007   Past Medical History:  Diagnosis Date  . Anxiety    takes Xanax daily as needed  . Asthma    Albuterol daily as needed  . Blood transfusion without reported diagnosis   . CAD (coronary artery disease)    LHC 2003 with 40% pLAD, 30% mLAD, 100% D1 (moderate vessel), 100%  D2 (small vessel), 95% mid small OM2.  Pt had PTCA to D1;  D2 and OM2 small vessels and tx medically;  Last Myoview (2012): anterior infarct seen with scar, no ischemia. EF normal. Med Rx // Myoview (12/14):  Low risk; ant scar w/ peri-infarct ischemia; EF 55% with ant AK // Myoview 5/16: EF 55-65, ant, apical scar, no ischemia   . Cataract   . Chronic back pain    HNP, DDD, spinal stenosis, vertebral compression fractures.   . Chronic nausea    takes Zofran daily as needed.  normal gastric emptying study 03/2013  . CKD (chronic kidney disease), stage III ([place]) 09/27/2017  . Constipation    felt to be functional. takes Senokot and Miralax daily.  treated with Linzess in past.   . DIVERTICULOSIS, COLON 08/28/2008   Qualifier: Diagnosis of  By: [name], [name], [name]    . Elevated LFTs 2015   probably med induced DILI, from Augmentin vs Pravastatin  . GERD (gastroesophageal reflux disease)    hx of esophageal stricture   . Hiatal hernia    Paraesophageal hernias. s/p fundoplications in 0973 and 04/2013  . History of bronchitis several of yrs  ago  . History of colon polyps 03/2009, 03/2011   adenomatous colon polyps, no high grade dysplasia.   [name] History of vertigo    no meds  . Hyperlipidemia    takes Pravastatin daily  . Hypertension    takes Amlodipine,Metoprolol,and Losartan daily  . Nausea 03/2016  . Osteoporosis   . PONV (postoperative nausea and vomiting)    yrs ago  . Slow urinary stream    takes Rapaflo daily  . TIA (transient ischemic attack) 1995   no residual effects noted    Family History  Problem Relation Age of Onset  . Colon  cancer Mother 63  . Heart attack Father 68       heart attack  . Brain cancer Brother        tumor   . Heart disease Brother   . Heart disease Brother   . Kidney disease Daughter   . Esophageal cancer Neg Hx   . Stomach cancer Neg Hx     Past Surgical History:  Procedure Laterality Date  . APPENDECTOMY     patient unsure of date  . BACK SURGERY    . BLADDER REPAIR    . CHOLECYSTECTOMY     Patient unsure of date.  . CORONARY ANGIOPLASTY  11/16/2001   1 stent  . ESOPHAGEAL MANOMETRY N/A 03/25/2013   Procedure: ESOPHAGEAL MANOMETRY (EM);  Surgeon: Inda Castle, MD;  Location: [facility];  Service: Endoscopy;  Laterality: N/A;  . ESOPHAGOGASTRODUODENOSCOPY N/A 03/15/2013   Procedure: ESOPHAGOGASTRODUODENOSCOPY (EGD);  Surgeon: Jerene Bears, MD;  Location[facility];  Service: Endoscopy;  Laterality: N/A;  . ESOPHAGOGASTRODUODENOSCOPY N/A 12/25/2015   Procedure: ESOPHAGOGASTRODUODENOSCOPY (EGD);  Surgeon: Doran Stabler, MD;  Location: [facility] ENDOSCOPY;  Service: Endoscopy;  Laterality: N/A;  . EYE SURGERY  11/2000   bilateral cataracts with lens implant  . FIXATION KYPHOPLASTY LUMBAR SPINE  X 2   "L3-4"  . HEMORRHOID SURGERY    . HIATAL HERNIA REPAIR  06/03/2011   Procedure: LAPAROSCOPIC REPAIR OF HIATAL HERNIA;  Surgeon: Adin Hector, MD;  Location: [facility];  Service: General;  Laterality: N/A;  . HIATAL HERNIA REPAIR N/A 04/24/2013   Procedure: LAPAROSCOPIC   REPAIR RECURRENT PARASOPHAGEAL HIATAL HERNIA WITH FUNDOPLICATION;  Surgeon: Adin Hector, MD;  Location: [facility];  Service: General;  Laterality: N/A;  . INSERTION OF MESH N/A 04/24/2013   Procedure: INSERTION OF MESH;  Surgeon: Adin Hector, MD;  Location: [facility];  Service: General;  Laterality: N/A;  . IR KYPHO THORACIC WITH BONE BIOPSY  09/29/2017  . IR KYPHO THORACIC WITH BONE BIOPSY  11/03/2017  . LAPAROSCOPIC LYSIS OF ADHESIONS N/A 04/24/2013   Procedure: LAPAROSCOPIC LYSIS OF ADHESIONS;  Surgeon: Adin Hector, MD;  Location: [facility];  Service: General;  Laterality: N/A;  . LAPAROSCOPIC NISSEN FUNDOPLICATION  10/28/2977   Procedure: LAPAROSCOPIC NISSEN FUNDOPLICATION;  Surgeon: Adin Hector, MD;  Location: [facility];  Service: General;  Laterality: N/A;  . LUMBAR FUSION  12/07/2015   Right L2-3 facetectomy, posterolateral fusion L2-3 with pedicle screws, rods, local bone graft, Vivigen cancellous chips. Fusion extended to the L1 level with pedicle screws and rods  . LUMBAR LAMINECTOMY/DECOMPRESSION MICRODISCECTOMY Right 06/26/2012   Procedure: LUMBAR LAMINECTOMY/DECOMPRESSION MICRODISCECTOMY;  Surgeon: Jessy Oto, MD;  Location: [facility];  Service: Orthopedics;  Laterality: Right;  RIGHT L4-5 MICRODISCECTOMY  . LUMBAR LAMINECTOMY/DECOMPRESSION MICRODISCECTOMY N/A 05/29/2015   Procedure: RIGHT L2-3 MICRODISCECTOMY;  Surgeon: Jessy Oto, MD;  Location: [facility];  Service: Orthopedics;  Laterality: N/A;  . TOTAL ABDOMINAL HYSTERECTOMY  1975   partial  . UPPER GASTROINTESTINAL ENDOSCOPY     Social History   Occupational History  . Occupation: Retired    Fish farm manager: RETIRED    Comment: worked at [employer]  . Smoking status: Never Smoker  . Smokeless tobacco: Never Used  Substance and Sexual Activity  . Alcohol use: No  . Drug use: Never  . Sexual activity: Not on file

## 2018-07-12 ENCOUNTER — Other Ambulatory Visit: Payer: Self-pay | Admitting: Adult Health

## 2018-07-12 ENCOUNTER — Encounter: Payer: Self-pay | Admitting: Rheumatology

## 2018-07-12 ENCOUNTER — Ambulatory Visit: Payer: PPO | Admitting: Rheumatology

## 2018-07-12 ENCOUNTER — Telehealth: Payer: Self-pay

## 2018-07-12 VITALS — Resp 14 | Ht 62.0 in | Wt 170.6 lb

## 2018-07-12 DIAGNOSIS — Z9049 Acquired absence of other specified parts of digestive tract: Secondary | ICD-10-CM

## 2018-07-12 DIAGNOSIS — M47819 Spondylosis without myelopathy or radiculopathy, site unspecified: Secondary | ICD-10-CM | POA: Diagnosis not present

## 2018-07-12 DIAGNOSIS — R899 Unspecified abnormal finding in specimens from other organs, systems and tissues: Secondary | ICD-10-CM

## 2018-07-12 DIAGNOSIS — M170 Bilateral primary osteoarthritis of knee: Secondary | ICD-10-CM

## 2018-07-12 DIAGNOSIS — Z8719 Personal history of other diseases of the digestive system: Secondary | ICD-10-CM

## 2018-07-12 DIAGNOSIS — M810 Age-related osteoporosis without current pathological fracture: Secondary | ICD-10-CM

## 2018-07-12 DIAGNOSIS — Z9889 Other specified postprocedural states: Secondary | ICD-10-CM | POA: Diagnosis not present

## 2018-07-12 DIAGNOSIS — Z8781 Personal history of (healed) traumatic fracture: Secondary | ICD-10-CM

## 2018-07-12 DIAGNOSIS — Z8669 Personal history of other diseases of the nervous system and sense organs: Secondary | ICD-10-CM

## 2018-07-12 DIAGNOSIS — Z8679 Personal history of other diseases of the circulatory system: Secondary | ICD-10-CM

## 2018-07-12 DIAGNOSIS — M5136 Other intervertebral disc degeneration, lumbar region: Secondary | ICD-10-CM

## 2018-07-12 DIAGNOSIS — Z8709 Personal history of other diseases of the respiratory system: Secondary | ICD-10-CM | POA: Diagnosis not present

## 2018-07-12 DIAGNOSIS — M51369 Other intervertebral disc degeneration, lumbar region without mention of lumbar back pain or lower extremity pain: Secondary | ICD-10-CM

## 2018-07-12 DIAGNOSIS — Z8639 Personal history of other endocrine, nutritional and metabolic disease: Secondary | ICD-10-CM

## 2018-07-12 DIAGNOSIS — Z8659 Personal history of other mental and behavioral disorders: Secondary | ICD-10-CM

## 2018-07-12 MED ORDER — ALENDRONATE SODIUM 70 MG PO TABS: ORAL_TABLET | ORAL | 0 refills | Status: DC

## 2018-07-12 NOTE — Telephone Encounter (Signed)
Patient states that Dr. Estanislado Pandy states that we need to order bone density test at the same place that it was done before at.

## 2018-07-18 ENCOUNTER — Telehealth: Payer: Self-pay | Admitting: Rheumatology

## 2018-07-18 NOTE — Telephone Encounter (Signed)
Spoke with the pharmacy and they have the prescription on hold. The pharmacist states when the patient came to pick up prescription before it was to early to fill. Pharmacist ran the prescription today and it went through. She is getting the prescription ready for the patient. Called patient to advise.

## 2018-07-18 NOTE — Telephone Encounter (Signed)
Patient called stating at her appointment on 3/5 she was told her prescription refill of Fosamax would be sent to the pharmacy.  Patient states she has been to [facility] two times, with the last time being 1 hour ago and they still have not received the prescription.  Patient is requesting a return call.

## 2018-08-28 ENCOUNTER — Other Ambulatory Visit: Payer: Self-pay | Admitting: Internal Medicine

## 2018-08-28 ENCOUNTER — Encounter: Payer: Self-pay | Admitting: Internal Medicine

## 2018-08-28 DIAGNOSIS — R450 Nervousness: Secondary | ICD-10-CM

## 2018-08-28 MED ORDER — ALPRAZOLAM 0.5 MG PO TABS: ORAL_TABLET | ORAL | 0 refills | Status: DC

## 2018-08-31 ENCOUNTER — Other Ambulatory Visit: Payer: Self-pay | Admitting: Internal Medicine

## 2018-09-10 ENCOUNTER — Ambulatory Visit: Payer: PPO | Admitting: Internal Medicine

## 2018-09-11 ENCOUNTER — Encounter: Payer: Self-pay | Admitting: Internal Medicine

## 2018-09-11 ENCOUNTER — Other Ambulatory Visit: Payer: Self-pay

## 2018-09-11 ENCOUNTER — Ambulatory Visit (INDEPENDENT_AMBULATORY_CARE_PROVIDER_SITE_OTHER): Payer: PPO | Admitting: Internal Medicine

## 2018-09-11 VITALS — BP 128/80 | HR 72 | Temp 97.6°F | Resp 16 | Ht 62.5 in | Wt 172.6 lb

## 2018-09-11 DIAGNOSIS — Z8249 Family history of ischemic heart disease and other diseases of the circulatory system: Secondary | ICD-10-CM | POA: Diagnosis not present

## 2018-09-11 DIAGNOSIS — E782 Mixed hyperlipidemia: Secondary | ICD-10-CM | POA: Diagnosis not present

## 2018-09-11 DIAGNOSIS — I700 Atherosclerosis of aorta: Secondary | ICD-10-CM

## 2018-09-11 DIAGNOSIS — J42 Unspecified chronic bronchitis: Secondary | ICD-10-CM

## 2018-09-11 DIAGNOSIS — I10 Essential (primary) hypertension: Secondary | ICD-10-CM

## 2018-09-11 DIAGNOSIS — I2583 Coronary atherosclerosis due to lipid rich plaque: Secondary | ICD-10-CM

## 2018-09-11 DIAGNOSIS — R7309 Other abnormal glucose: Secondary | ICD-10-CM

## 2018-09-11 DIAGNOSIS — Z0001 Encounter for general adult medical examination with abnormal findings: Secondary | ICD-10-CM

## 2018-09-11 DIAGNOSIS — R7303 Prediabetes: Secondary | ICD-10-CM

## 2018-09-11 DIAGNOSIS — Z0000 Encounter for general adult medical examination without abnormal findings: Secondary | ICD-10-CM | POA: Diagnosis not present

## 2018-09-11 DIAGNOSIS — Z1211 Encounter for screening for malignant neoplasm of colon: Secondary | ICD-10-CM

## 2018-09-11 DIAGNOSIS — Z1212 Encounter for screening for malignant neoplasm of rectum: Secondary | ICD-10-CM

## 2018-09-11 DIAGNOSIS — F3341 Major depressive disorder, recurrent, in partial remission: Secondary | ICD-10-CM

## 2018-09-11 DIAGNOSIS — E559 Vitamin D deficiency, unspecified: Secondary | ICD-10-CM

## 2018-09-11 DIAGNOSIS — I2510 Atherosclerotic heart disease of native coronary artery without angina pectoris: Secondary | ICD-10-CM

## 2018-09-11 DIAGNOSIS — N183 Chronic kidney disease, stage 3 unspecified: Secondary | ICD-10-CM

## 2018-09-11 DIAGNOSIS — Z136 Encounter for screening for cardiovascular disorders: Secondary | ICD-10-CM

## 2018-09-11 DIAGNOSIS — K219 Gastro-esophageal reflux disease without esophagitis: Secondary | ICD-10-CM

## 2018-09-11 DIAGNOSIS — Z79899 Other long term (current) drug therapy: Secondary | ICD-10-CM

## 2018-09-11 NOTE — Patient Instructions (Signed)
>>>>>>>>>>>>>>>>>>>>>>>>>>>>>>>>>>>>>>>>>>>>>>>>>>>>>>> Coronavirus (COVID-19) Are you at risk?  Are you at risk for the Coronavirus (COVID-19)?  To be considered HIGH RISK for Coronavirus (COVID-19), you have to meet the following criteria:  . Traveled to [place], [place], [place], [place] or [place]; or in the [place] to [place], [place], [place]  . or [place]; and have fever, cough, and shortness of breath within the last 2 weeks of travel OR . Been in close contact with a person diagnosed with COVID-19 within the last 2 weeks and have  . fever, cough,and shortness of breath .  . IF YOU DO NOT MEET THESE CRITERIA, YOU ARE CONSIDERED LOW RISK FOR COVID-19.  What to do if you are HIGH RISK for COVID-19?  [name] If you are having a medical emergency, call 911. . Seek medical care right away. Before you go to a doctor's office, urgent care or emergency department, .  call ahead and tell them about your recent travel, contact with someone diagnosed with COVID-19  .  and your symptoms.  . You should receive instructions from your physician's office regarding next steps of care.  . When you arrive at healthcare provider, tell the healthcare staff immediately you have returned from  . visiting [place], [place], [place], [place] or [place]; or traveled in the [place] to [place], [place],  . [place] or [place] in the last two weeks or you have been in close contact with a person diagnosed with  . COVID-19 in the last 2 weeks.   . Tell the health care staff about your symptoms: fever, cough and shortness of breath. . After you have been seen by a medical provider, you will be either: o Tested for (COVID-19) and discharged home on quarantine except to seek medical care if  o symptoms worsen, and asked to  - Stay home and avoid contact with others until you get your results (4-5 days)  - Avoid travel on public transportation if possible (such as bus, train, or airplane)  or o Sent to the Emergency Department by EMS for evaluation, COVID-19 testing  and  o possible admission depending on your condition and test results.  What to do if you are LOW RISK for COVID-19?  Reduce your risk of any infection by using the same precautions used for avoiding the common cold or flu:  [name] Wash your hands often with soap and warm water for at least 20 seconds.  If soap and water are not readily available,  . use an alcohol-based hand sanitizer with at least 60% alcohol.  . If coughing or sneezing, cover your mouth and nose by coughing or sneezing into the elbow areas of your shirt or coat, .  into a tissue or into your sleeve (not your hands). . Avoid shaking hands with others and consider head nods or verbal greetings only. . Avoid touching your eyes, nose, or mouth with unwashed hands.  . Avoid close contact with people who are sick. . Avoid places or events with large numbers of people in one location, like concerts or sporting events. . Carefully consider travel plans you have or are making. . If you are planning any travel outside or inside the [place], visit the CDC's Travelers' Health webpage for the latest health notices. . If you have some symptoms but not all symptoms, continue to monitor at home and seek medical attention  . if your symptoms worsen. . If you are having a medical emergency, call 911.   . >>>>>>>>>>>>>>>>>>>>>>>>>>>>>>>>> .  We Do NOT Approve of  [facility], [employer]  Our Patients  To Do Home Visits & We Do NOT Approve of [employer] > > > > > > > > > > > > > > > > > > > > > > > > > > > > > > > > > > > > > > >  Preventive Care for Adults  A healthy lifestyle and preventive care can promote health and wellness. Preventive health guidelines for women include the following key practices.  A routine yearly physical is a good way to check with your health care provider about your health and preventive screening. It is a  chance to share any concerns and updates on your health and to receive a thorough exam.  Visit your dentist for a routine exam and preventive care every 6 months. Brush your teeth twice a day and floss once a day. Good oral hygiene prevents tooth decay and gum disease.  The frequency of eye exams is based on your age, health, family medical history, use of contact lenses, and other factors. Follow your health care provider's recommendations for frequency of eye exams.  Eat a healthy diet. Foods like vegetables, fruits, whole grains, low-fat dairy products, and lean protein foods contain the nutrients you need without too many calories. Decrease your intake of foods high in solid fats, added sugars, and salt. Eat the right amount of calories for you. Get information about a proper diet from your health care provider, if necessary.  Regular physical exercise is one of the most important things you can do for your health. Most adults should get at least 150 minutes of moderate-intensity exercise (any activity that increases your heart rate and causes you to sweat) each week. In addition, most adults need muscle-strengthening exercises on 2 or more days a week.  Maintain a healthy weight. The body mass index (BMI) is a screening tool to identify possible weight problems. It provides an estimate of body fat based on height and weight. Your health care provider can find your BMI and can help you achieve or maintain a healthy weight. For adults 20 years and older:  A BMI below 18.5 is considered underweight.  A BMI of 18.5 to 24.9 is normal.  A BMI of 25 to 29.9 is considered overweight.  A BMI of 30 and above is considered obese.  Maintain normal blood lipids and cholesterol levels by exercising and minimizing your intake of saturated fat. Eat a balanced diet with plenty of fruit and vegetables. If your lipid or cholesterol levels are high, you are over 50, or you are at high risk for heart disease,  you may need your cholesterol levels checked more frequently. Ongoing high lipid and cholesterol levels should be treated with medicines if diet and exercise are not working.  If you smoke, find out from your health care provider how to quit. If you do not use tobacco, do not start.  Lung cancer screening is recommended for adults aged 60-80 years who are at high risk for developing lung cancer because of a history of smoking. A yearly low-dose CT scan of the lungs is recommended for people who have at least a 30-pack-year history of smoking and are a current smoker or have quit within the past 15 years. A pack year of smoking is smoking an average of 1 pack of cigarettes a day for 1 year (for example: 1 pack a day for 30 years or 2 packs a  day for 15 years). Yearly screening should continue until the smoker has stopped smoking for at least 15 years. Yearly screening should be stopped for people who develop a health problem that would prevent them from having lung cancer treatment.  Avoid use of street drugs. Do not share needles with anyone. Ask for help if you need support or instructions about stopping the use of drugs.  High blood pressure causes heart disease and increases the risk of stroke.  Ongoing high blood pressure should be treated with medicines if weight loss and exercise do not work.  If you are 16-48 years old, ask your health care provider if you should take aspirin to prevent strokes.  Diabetes screening involves taking a blood sample to check your fasting blood sugar level. This should be done once every 3 years, after age 8, if you are within normal weight and without risk factors for diabetes. Testing should be considered at a younger age or be carried out more frequently if you are overweight and have at least 1 risk factor for diabetes.  Breast cancer screening is essential preventive care for women. You should practice "breast self-awareness." This means understanding the  normal appearance and feel of your breasts and may include breast self-examination. Any changes detected, no matter how small, should be reported to a health care provider. Women in their 28s and 30s should have a clinical breast exam (CBE) by a health care provider as part of a regular health exam every 1 to 3 years. After age 45, women should have a CBE every year. Starting at age 38, women should consider having a mammogram (breast X-ray test) every year. Women who have a family history of breast cancer should talk to their health care provider about genetic screening. Women at a high risk of breast cancer should talk to their health care providers about having an MRI and a mammogram every year.  Breast cancer gene (BRCA)-related cancer risk assessment is recommended for women who have family members with BRCA-related cancers. BRCA-related cancers include breast, ovarian, tubal, and peritoneal cancers. Having family members with these cancers may be associated with an increased risk for harmful changes (mutations) in the breast cancer genes BRCA1 and BRCA2. Results of the assessment will determine the need for genetic counseling and BRCA1 and BRCA2 testing.  Routine pelvic exams to screen for cancer are no longer recommended for nonpregnant women who are considered low risk for cancer of the pelvic organs (ovaries, uterus, and vagina) and who do not have symptoms. Ask your health care provider if a screening pelvic exam is right for you.  If you have had past treatment for cervical cancer or a condition that could lead to cancer, you need Pap tests and screening for cancer for at least 20 years after your treatment. If Pap tests have been discontinued, your risk factors (such as having a new sexual partner) need to be reassessed to determine if screening should be resumed. Some women have medical problems that increase the chance of getting cervical cancer. In these cases, your health care provider may  recommend more frequent screening and Pap tests.    Colorectal cancer can be detected and often prevented. Most routine colorectal cancer screening begins at the age of 69 years and continues through age 55 years. However, your health care provider may recommend screening at an earlier age if you have risk factors for colon cancer. On a yearly basis, your health care provider may provide home test kits to  check for hidden blood in the stool. Use of a small camera at the end of a tube, to directly examine the colon (sigmoidoscopy or colonoscopy), can detect the earliest forms of colorectal cancer. Talk to your health care provider about this at age 50, when routine screening begins.  Direct exam of the colon should be repeated every 5-10 years through age 75 years, unless early forms of pre-cancerous polyps or small growths are found.  Osteoporosis is a disease in which the bones lose minerals and strength with aging. This can result in serious bone fractures or breaks. The risk of osteoporosis can be identified using a bone density scan. Women ages 65 years and over and women at risk for fractures or osteoporosis should discuss screening with their health care providers. Ask your health care provider whether you should take a calcium supplement or vitamin D to reduce the rate of osteoporosis.  Menopause can be associated with physical symptoms and risks. Hormone replacement therapy is available to decrease symptoms and risks. You should talk to your health care provider about whether hormone replacement therapy is right for you.  Use sunscreen. Apply sunscreen liberally and repeatedly throughout the day. You should seek shade when your shadow is shorter than you. Protect yourself by wearing long sleeves, pants, a wide-brimmed hat, and sunglasses year round, whenever you are outdoors.  Once a month, do a whole body skin exam, using a mirror to look at the skin on your back. Tell your health care provider  of new moles, moles that have irregular borders, moles that are larger than a pencil eraser, or moles that have changed in shape or color.  Stay current with required vaccines (immunizations).  Influenza vaccine. All adults should be immunized every year.  Tetanus, diphtheria, and acellular pertussis (Td, Tdap) vaccine. Pregnant women should receive 1 dose of Tdap vaccine during each pregnancy. The dose should be obtained regardless of the length of time since the last dose. Immunization is preferred during the 27th-36th week of gestation. An adult who has not previously received Tdap or who does not know her vaccine status should receive 1 dose of Tdap. This initial dose should be followed by tetanus and diphtheria toxoids (Td) booster doses every 10 years. Adults with an unknown or incomplete history of completing a 3-dose immunization series with Td-containing vaccines should begin or complete a primary immunization series including a Tdap dose. Adults should receive a Td booster every 10 years.    Zoster vaccine. One dose is recommended for adults aged 60 years or older unless certain conditions are present.    Pneumococcal 13-valent conjugate (PCV13) vaccine. When indicated, a person who is uncertain of her immunization history and has no record of immunization should receive the PCV13 vaccine. An adult aged 19 years or older who has certain medical conditions and has not been previously immunized should receive 1 dose of PCV13 vaccine. This PCV13 should be followed with a dose of pneumococcal polysaccharide (PPSV23) vaccine. The PPSV23 vaccine dose should be obtained at least 1 or more year(s) after the dose of PCV13 vaccine. An adult aged 19 years or older who has certain medical conditions and previously received 1 or more doses of PPSV23 vaccine should receive 1 dose of PCV13. The PCV13 vaccine dose should be obtained 1 or more years after the last PPSV23 vaccine dose.    Pneumococcal  polysaccharide (PPSV23) vaccine. When PCV13 is also indicated, PCV13 should be obtained first. All adults aged 65 years and older   should be immunized. An adult younger than age 58 years who has certain medical conditions should be immunized. Any person who resides in a nursing home or long-term care facility should be immunized. An adult smoker should be immunized. People with an immunocompromised condition and certain other conditions should receive both PCV13 and PPSV23 vaccines. People with human immunodeficiency virus (HIV) infection should be immunized as soon as possible after diagnosis. Immunization during chemotherapy or radiation therapy should be avoided. Routine use of PPSV23 vaccine is not recommended for American Indians, [place] Natives, or people younger than 65 years unless there are medical conditions that require PPSV23 vaccine. When indicated, people who have unknown immunization and have no record of immunization should receive PPSV23 vaccine. One-time revaccination 5 years after the first dose of PPSV23 is recommended for people aged 19-64 years who have chronic kidney failure, nephrotic syndrome, asplenia, or immunocompromised conditions. People who received 1-2 doses of PPSV23 before age 82 years should receive another dose of PPSV23 vaccine at age 2 years or later if at least 5 years have passed since the previous dose. Doses of PPSV23 are not needed for people immunized with PPSV23 at or after age 9 years.   Preventive Services / Frequency  Ages 40 years and over  Blood pressure check.  Lipid and cholesterol check.  Lung cancer screening. / Every year if you are aged 49-80 years and have a 30-pack-year history of smoking and currently smoke or have quit within the past 15 years. Yearly screening is stopped once you have quit smoking for at least 15 years or develop a health problem that would prevent you from having lung cancer treatment.  Clinical breast exam.** / Every year  after age 53 years.   BRCA-related cancer risk assessment.** / For women who have family members with a BRCA-related cancer (breast, ovarian, tubal, or peritoneal cancers).  Mammogram.** / Every year beginning at age 56 years and continuing for as long as you are in good health. Consult with your health care provider.  Pap test.** / Every 3 years starting at age 73 years through age 8 or 48 years with 3 consecutive normal Pap tests. Testing can be stopped between 65 and 70 years with 3 consecutive normal Pap tests and no abnormal Pap or HPV tests in the past 10 years.  Fecal occult blood test (FOBT) of stool. / Every year beginning at age 58 years and continuing until age 53 years. You may not need to do this test if you get a colonoscopy every 10 years.  Flexible sigmoidoscopy or colonoscopy.** / Every 5 years for a flexible sigmoidoscopy or every 10 years for a colonoscopy beginning at age 35 years and continuing until age 78 years.  Hepatitis C blood test.** / For all people born from [birth year] through 1963 and any individual with known risks for hepatitis C.  Osteoporosis screening.** / A one-time screening for women ages 54 years and over and women at risk for fractures or osteoporosis.  Skin self-exam. / Monthly.  Influenza vaccine. / Every year.  Tetanus, diphtheria, and acellular pertussis (Tdap/Td) vaccine.** / 1 dose of Td every 10 years.  Zoster vaccine.** / 1 dose for adults aged 48 years or older.  Pneumococcal 13-valent conjugate (PCV13) vaccine.** / Consult your health care provider.  Pneumococcal polysaccharide (PPSV23) vaccine.** / 1 dose for all adults aged 14 years and older. Screening for abdominal aortic aneurysm (AAA)  by ultrasound is recommended for people who have history of high blood pressure  or who are current or former smokers. ++++++++++++++++++++ Recommend Adult Low Dose Aspirin or  coated  Aspirin 81 mg daily  To reduce risk of Colon Cancer 20 %,  Skin  Cancer 26 % ,  Melanoma 46%  and  Pancreatic cancer 60% ++++++++++++++++++++ Vitamin D goal  is between 70-100.  Please make sure that you are taking your Vitamin D as directed.  It is very important as a natural anti-inflammatory  helping hair, skin, and nails, as well as reducing stroke and heart attack risk.  It helps your bones and helps with mood. It also decreases numerous cancer risks so please take it as directed.  Low Vit D is associated with a 200-300% higher risk for CANCER  and 200-300% higher risk for HEART   ATTACK  &  STROKE.   .....................................[name] It is also associated with higher death rate at younger ages,  autoimmune diseases like Rheumatoid arthritis, Lupus, Multiple Sclerosis.    Also many other serious conditions, like depression, Alzheimer's Dementia, infertility, muscle aches, fatigue, fibromyalgia - just to name a few. ++++++++++++++++++ Recommend the book "The END of DIETING" by Dr Excell Seltzer  & the book "The END of DIABETES " by Dr Excell Seltzer At [email or web address] - get book & Audio CD's    Being diabetic has a  300% increased risk for heart attack, stroke, cancer, and alzheimer- type vascular dementia. It is very important that you work harder with diet by avoiding all foods that are white. Avoid white rice (brown & wild rice is OK), white potatoes (sweetpotatoes in moderation is OK), White bread or wheat bread or anything made out of white flour like bagels, donuts, rolls, buns, biscuits, cakes, pastries, cookies, pizza crust, and pasta (made from white flour & egg whites) - vegetarian pasta or spinach or wheat pasta is OK. Multigrain breads like Arnold's or Pepperidge [employer], or multigrain sandwich thins or flatbreads.  Diet, exercise and weight loss can reverse and cure diabetes in the early stages.  Diet, exercise and weight loss is very important in the control and prevention of complications of diabetes which affects every system in your body, ie.  Brain - dementia/stroke, eyes - glaucoma/blindness, heart - heart attack/heart failure, kidneys - dialysis, stomach - gastric paralysis, intestines - malabsorption, nerves - severe painful neuritis, circulation - gangrene & loss of a leg(s), and finally cancer and Alzheimers.    I recommend avoid fried & greasy foods,  sweets/candy, white rice (brown or wild rice or Quinoa is OK), white potatoes (sweet potatoes are OK) - anything made from white flour - bagels, doughnuts, rolls, buns, biscuits,white and wheat breads, pizza crust and traditional pasta made of white flour & egg white(vegetarian pasta or spinach or wheat pasta is OK).  Multi-grain bread is OK - like multi-grain flat bread or sandwich thins. Avoid alcohol in excess. Exercise is also important.    Eat all the vegetables you want - avoid meat, especially red meat and dairy - especially cheese.  Cheese is the most concentrated form of trans-fats which is the worst thing to clog up our arteries. Veggie cheese is OK which can be found in the fresh produce section at [employer] or [employer] or [employer]  +++++++++++++++++++ DASH Eating Plan  DASH stands for "Dietary Approaches to Stop Hypertension."   The DASH eating plan is a healthy eating plan that has been shown to reduce high blood pressure (hypertension). Additional health benefits may include reducing the risk of type 2 diabetes mellitus, heart  disease, and stroke. The DASH eating plan may also help with weight loss. WHAT DO I NEED TO KNOW ABOUT THE DASH EATING PLAN? For the DASH eating plan, you will follow these general guidelines:  Choose foods with a percent daily value for sodium of less than 5% (as listed on the food label).  Use salt-free seasonings or herbs instead of table salt or sea salt.  Check with your health care provider or pharmacist before using salt substitutes.  Eat lower-sodium products, often labeled as "lower sodium" or "no salt added."  Eat fresh  foods.  Eat more vegetables, fruits, and low-fat dairy products.  Choose whole grains. Look for the word "whole" as the first word in the ingredient list.  Choose fish   Limit sweets, desserts, sugars, and sugary drinks.  Choose heart-healthy fats.  Eat veggie cheese   Eat more home-cooked food and less restaurant, buffet, and fast food.  Limit fried foods.  Cook foods using methods other than frying.  Limit canned vegetables. If you do use them, rinse them well to decrease the sodium.  When eating at a restaurant, ask that your food be prepared with less salt, or no salt if possible.                      WHAT FOODS CAN I EAT? Read Dr Fara Olden Fuhrman's books on The End of Dieting & The End of Diabetes  Grains Whole grain or whole wheat bread. Brown rice. Whole grain or whole wheat pasta. Quinoa, bulgur, and whole grain cereals. Low-sodium cereals. Corn or whole wheat flour tortillas. Whole grain cornbread. Whole grain crackers. Low-sodium crackers.  Vegetables Fresh or frozen vegetables (raw, steamed, roasted, or grilled). Low-sodium or reduced-sodium tomato and vegetable juices. Low-sodium or reduced-sodium tomato sauce and paste. Low-sodium or reduced-sodium canned vegetables.   Fruits All fresh, canned (in natural juice), or frozen fruits.  Protein Products  All fish and seafood.  Dried beans, peas, or lentils. Unsalted nuts and seeds. Unsalted canned beans.  Dairy Low-fat dairy products, such as skim or 1% milk, 2% or reduced-fat cheeses, low-fat ricotta or cottage cheese, or plain low-fat yogurt. Low-sodium or reduced-sodium cheeses.  Fats and Oils Tub margarines without trans fats. Light or reduced-fat mayonnaise and salad dressings (reduced sodium). Avocado. Safflower, olive, or canola oils. Natural peanut or almond butter.  Other Unsalted popcorn and pretzels. The items listed above may not be a complete list of recommended foods or beverages. Contact your  dietitian for more options.  +++++++++++++++  WHAT FOODS ARE NOT RECOMMENDED? Grains/ White flour or wheat flour White bread. White pasta. White rice. Refined cornbread. Bagels and croissants. Crackers that contain trans fat.  Vegetables  Creamed or fried vegetables. Vegetables in a . Regular canned vegetables. Regular canned tomato sauce and paste. Regular tomato and vegetable juices.  Fruits Dried fruits. Canned fruit in light or heavy syrup. Fruit juice.  Meat and Other Protein Products Meat in general - RED meat & White meat.  Fatty cuts of meat. Ribs, chicken wings, all processed meats as bacon, sausage, bologna, salami, fatback, hot dogs, bratwurst and packaged luncheon meats.  Dairy Whole or 2% milk, cream, half-and-half, and cream cheese. Whole-fat or sweetened yogurt. Full-fat cheeses or blue cheese. Non-dairy creamers and whipped toppings. Processed cheese, cheese spreads, or cheese curds.  Condiments Onion and garlic salt, seasoned salt, table salt, and sea salt. Canned and packaged gravies. Worcestershire sauce. Tartar sauce. Barbecue sauce. Teriyaki sauce. Soy sauce, including  reduced sodium. Steak sauce. Fish sauce. Oyster sauce. Cocktail sauce. Horseradish. Ketchup and mustard. Meat flavorings and tenderizers. Bouillon cubes. Hot sauce. Tabasco sauce. Marinades. Taco seasonings. Relishes.  Fats and Oils Butter, stick margarine, lard, shortening and bacon fat. Coconut, palm kernel, or palm oils. Regular salad dressings.  Pickles and olives. Salted popcorn and pretzels.  The items listed above may not be a complete list of foods and beverages to avoid.

## 2018-09-11 NOTE — Progress Notes (Signed)
ADULT & ADOLESCENT INTERNAL MEDICINE [name], M.D.     Uvaldo Bristle. Silverio Lay, P.A.-C [name], [place] [address] [place], [place] [address] Telephone [phone] Telefax [phone] Annual Screening/Preventative Visit & Comprehensive Evaluation &  Examination  History of Present Illness:        This very nice 83 y.o. MWF  presents for a Screening /Preventative Visit & comprehensive evaluation and management of multiple medical co-morbidities.  Patient has been followed for HTN, HLD, T2_NIDDM  Prediabetes  and Vitamin D Deficiency.      HTN predates since     . Patient's BP has been controlled at home and patient denies any cardiac symptoms as chest pain, palpitations, shortness of breath, dizziness or ankle swelling. Today's BP is at goal -128/80.      Patient's hyperlipidemia is controlled with diet and medications. Patient denies myalgias or other medication SE's. Last lipids were  Lab Results  Component Value Date   CHOL 182 09/11/2018   HDL 65 09/11/2018   LDLCALC 94 09/11/2018   TRIG 133 09/11/2018   CHOLHDL 2.8 09/11/2018      Patient has hx/o prediabetes (A1c 6.1%  /  2013)  and patient denies reactive hypoglycemic symptoms, visual blurring, diabetic polys or paresthesias. Last A1c was not at goal: Lab Results  Component Value Date   HGBA1C 5.8 (H) 09/11/2018      Finally, patient has history of Vitamin D Deficiency and last Vitamin D was near goal (70-100): Lab Results  Component Value Date   VD25OH 56 09/11/2018    Current Outpatient Medications on File Prior to Visit  Medication Sig  . acetaminophen (TYLENOL) 500 MG tablet Take 1 tablet (500 mg total) by mouth every 6 (six) hours as needed. (Patient taking differently: Take 500-1,000 mg by mouth every 6 (six) hours as needed (pain). )  . albuterol (PROAIR HFA) 108 (90 Base) MCG/ACT inhaler Inhale 2 puffs into the lungs every 6 (six) hours as needed  for wheezing or shortness of breath.  [name] alendronate (FOSAMAX) 70 MG tablet TAKE 1 TABLET BY MOUTH ONCE A WEEK. TAKE WITH A FULL GLASS OF WATER ON AN EMPTY STOMACH.  [name] ALPRAZolam (XANAX) 0.5 MG tablet Take 1/2 tablet 1- 2 x /day ONLY if needed for Anxiety Attack &  limit to 5 days /week to avoid addiction (Last till June 23rd)  . amLODipine (NORVASC) 10 MG tablet TAKE 1 TABLET BY MOUTH AT BEDTIME (Patient taking differently: Take 10 mg by mouth at bedtime. )  . aspirin EC 81 MG tablet Take 81 mg by mouth at bedtime.   . Cholecalciferol (VITAMIN D3) 5000 units CAPS Take 5,000 Units by mouth 2 (two) times daily.   [name] Coral Calcium 1000 (390 Ca) MG TABS Take 1,000 mg by mouth 2 (two) times daily.  . diclofenac sodium (VOLTAREN) 1 % GEL Apply 2 g topically 4 (four) times daily. (Patient taking differently: Apply 2 g topically 4 (four) times daily as needed (pain). )  . dicyclomine (BENTYL) 10 MG capsule TAKE 1 CAPSULE BY MOUTH EVERY 8 HOURS AS NEEDED FOR SPASM (Patient taking differently: Take 10 mg by mouth 3 (three) times daily as needed for spasms. )  . fluticasone furoate-vilanterol (BREO ELLIPTA) 100-25 MCG/INH AEPB Inhale 1 puff into the lungs daily. Rinse mouth with water after each use  . gabapentin (NEURONTIN) 300 MG capsule TAKE 1 CAPSULE BY MOUTH THREE TIMES DAILY FOR  CHRONIC  PAIN. (Patient taking differently:  Take 300 mg by mouth 2 (two) times daily. )  . ipratropium-albuterol (DUONEB) 0.5-2.5 (3) MG/3ML SOLN Take 3 mLs by nebulization every 6 (six) hours as needed (shortness of breath/wheezing).   [name] ketoconazole (NIZORAL) 2 % cream  APPLY ONE APPLICATION TOPICALLY TWO TIMES DAILY (Patient taking differently: Apply 1 application topically 2 (two) times daily as needed for irritation. )  . losartan (COZAAR) 50 MG tablet Take 1 tablet (50 mg total) by mouth daily.  [name] lubiprostone (AMITIZA) 8 MCG capsule Take 1 capsule (8 mcg total) by mouth 2 (two) times daily with a meal.  . Magnesium 400 MG  TABS Take 400 mg by mouth daily.  . meclizine (ANTIVERT) 25 MG tablet Take 1/2 to 1 tablet 2 to 3 x /day ONLY if needed  For Dizziness / Vertigo  . metoprolol tartrate (LOPRESSOR) 50 MG tablet Take 25 mg by mouth at bedtime.   . nitroGLYCERIN (NITROSTAT) 0.4 MG SL tablet Place 1 tablet (0.4 mg total) under the tongue every 5 (five) minutes as needed for chest pain.  [name] omeprazole (PRILOSEC) 20 MG capsule Take 1 capsule daily for heartburn, Reflux & Nausea (Patient taking differently: Take 20 mg by mouth daily. For heartburn, reflux and/or nausea)  . ondansetron (ZOFRAN) 4 MG tablet Take 1 tablet (4 mg total) by mouth every 8 (eight) hours as needed for nausea or vomiting.  . polyethylene glycol powder (GLYCOLAX/MIRALAX) powder Take 17 g by mouth 2 (two) times daily. (Patient taking differently: Take 17 g by mouth daily. )  . pravastatin (PRAVACHOL) 40 MG tablet TAKE 1 TABLET BY MOUTH DAILY (Patient taking differently: Take 40 mg by mouth at bedtime. )  . promethazine (PHENERGAN) 25 MG tablet TAKE 1 TABLET BY MOUTH EVERY 6 HOURS AS NEEDED FOR NAUSEA AND VOMITING  . senna-docusate (SENOKOT-S) 8.6-50 MG tablet Take 2 tablets by mouth 2 (two) times daily. (Patient taking differently: Take 1 tablet by mouth 2 (two) times daily. )  . sucralfate (CARAFATE) 1 g tablet Take 1 tablet 2 to 4 x /day before meals for Heartburn (Patient taking differently: Take 1 g by mouth 3 (three) times daily before meals. )  . tamsulosin (FLOMAX) 0.4 MG CAPS capsule Take 0.4 mg by mouth daily.   No current facility-administered medications on file prior to visit.    Allergies  Allergen Reactions  . Ace Inhibitors Cough    Per Dr Melvyn Novas pulmonology 2013  . Aspartame Diarrhea and Nausea And Vomiting  . Atorvastatin Other (See Comments)    Muscle aches  . Biaxin [Clarithromycin] Other (See Comments)    Unknown reaction : PATIENT CAN TOLERATE Z-PAK.  Is written on patient's paper chart.  [name] [Roflumilast] Nausea And  Vomiting  . Erythromycin Other (See Comments)    Unknown reaction: PATIENT CAN TOLERATE Z-PAK.  It is written on patient's paper chart.  . Levofloxacin Nausea And Vomiting   Past Medical History:  Diagnosis Date  . Anxiety    takes Xanax daily as needed  . Asthma    Albuterol daily as needed  . Blood transfusion without reported diagnosis   . CAD (coronary artery disease)    LHC 2003 with 40% pLAD, 30% mLAD, 100% D1 (moderate vessel), 100%  D2 (small vessel), 95% mid small OM2.  Pt had PTCA to D1;  D2 and OM2 small vessels and tx medically;  Last Myoview (2012): anterior infarct seen with scar, no ischemia. EF normal. Med Rx // Myoview (12/14):  Low risk; ant scar  w/ peri-infarct ischemia; EF 55% with ant AK // Myoview 5/16: EF 55-65, ant, apical scar, no ischemia   . Cataract   . Chronic back pain    HNP, DDD, spinal stenosis, vertebral compression fractures.   . Chronic nausea    takes Zofran daily as needed.  normal gastric emptying study 03/2013  . CKD (chronic kidney disease), stage III ([place]) 09/27/2017  . Constipation    felt to be functional. takes Senokot and Miralax daily.  treated with Linzess in past.   . DIVERTICULOSIS, COLON 08/28/2008   Qualifier: Diagnosis of  By: [name], [name], [name]    . Elevated LFTs 2015   probably med induced DILI, from Augmentin vs Pravastatin  . GERD (gastroesophageal reflux disease)    hx of esophageal stricture   . Hiatal hernia    Paraesophageal hernias. s/p fundoplications in 7322 and 04/2013  . History of bronchitis several of yrs ago  . History of colon polyps 03/2009, 03/2011   adenomatous colon polyps, no high grade dysplasia.   [name] History of vertigo    no meds  . Hyperlipidemia    takes Pravastatin daily  . Hypertension    takes Amlodipine,Metoprolol,and Losartan daily  . Nausea 03/2016  . Osteoporosis   . PONV (postoperative nausea and vomiting)    yrs ago  . Slow urinary stream    takes Rapaflo daily  . TIA (transient  ischemic attack) 1995   no residual effects noted   Health Maintenance  Topic Date Due  . INFLUENZA VACCINE  12/08/2018  . COLONOSCOPY  05/17/2020  . TETANUS/TDAP  04/24/2024  . DEXA SCAN  Completed  . PNA vac Low Risk Adult  Completed   Immunization History  Administered Date(s) Administered  . DT 05/06/2014  . Influenza Split 02/07/2011  . Influenza Whole 02/09/2009, 01/19/2010, 02/13/2012  . Influenza, High Dose Seasonal PF 03/22/2013, 01/15/2014, 02/27/2015, 01/14/2016, 01/18/2017, 02/14/2018  . Pneumococcal Conjugate-13 05/06/2014  . Pneumococcal Polysaccharide-23 02/09/2011  . Pneumococcal-Unspecified 02/06/2010, 02/09/2012  . Td 05/10/2003   Last Colon -   Last MGM -   Past Surgical History:  Procedure Laterality Date  . APPENDECTOMY     patient unsure of date  . BACK SURGERY    . BLADDER REPAIR    . CHOLECYSTECTOMY     Patient unsure of date.  . CORONARY ANGIOPLASTY  11/16/2001   1 stent  . ESOPHAGEAL MANOMETRY N/A 03/25/2013   Procedure: ESOPHAGEAL MANOMETRY (EM);  Surgeon: Inda Castle, MD;  Location: [facility] ENDOSCOPY;  Service: Endoscopy;  Laterality: N/A;  . ESOPHAGOGASTRODUODENOSCOPY N/A 03/15/2013   Procedure: ESOPHAGOGASTRODUODENOSCOPY (EGD);  Surgeon: Jerene Bears, MD;  Location: [facility] ENDOSCOPY;  Service: Endoscopy;  Laterality: N/A;  . ESOPHAGOGASTRODUODENOSCOPY N/A 12/25/2015   Procedure: ESOPHAGOGASTRODUODENOSCOPY (EGD);  Surgeon: Doran Stabler, MD;  Location: [facility] ENDOSCOPY;  Service: Endoscopy;  Laterality: N/A;  . EYE SURGERY  11/2000   bilateral cataracts with lens implant  . FIXATION KYPHOPLASTY LUMBAR SPINE  X 2   "L3-4"  . HEMORRHOID SURGERY    . HIATAL HERNIA REPAIR  06/03/2011   Procedure: LAPAROSCOPIC REPAIR OF HIATAL HERNIA;  Surgeon: Adin Hector, MD;  Location: [facility] ORS;  Service: General;  Laterality: N/A;  . HIATAL HERNIA REPAIR N/A 04/24/2013   Procedure: LAPAROSCOPIC  REPAIR RECURRENT PARASOPHAGEAL HIATAL HERNIA WITH FUNDOPLICATION;   Surgeon: Adin Hector, MD;  Location: [facility];  Service: General;  Laterality: N/A;  . INSERTION OF MESH N/A 04/24/2013   Procedure: INSERTION OF  MESH;  Surgeon: Adin Hector, MD;  Location: [facility];  Service: General;  Laterality: N/A;  . IR KYPHO THORACIC WITH BONE BIOPSY  09/29/2017  . IR KYPHO THORACIC WITH BONE BIOPSY  11/03/2017  . LAPAROSCOPIC LYSIS OF ADHESIONS N/A 04/24/2013   Procedure: LAPAROSCOPIC LYSIS OF ADHESIONS;  Surgeon: Adin Hector, MD;  Location: [facility];  Service: General;  Laterality: N/A;  . LAPAROSCOPIC NISSEN FUNDOPLICATION  7/90/2409   Procedure: LAPAROSCOPIC NISSEN FUNDOPLICATION;  Surgeon: Adin Hector, MD;  Location: [facility];  Service: General;  Laterality: N/A;  . LUMBAR FUSION  12/07/2015   Right L2-3 facetectomy, posterolateral fusion L2-3 with pedicle screws, rods, local bone graft, Vivigen cancellous chips. Fusion extended to the L1 level with pedicle screws and rods  . LUMBAR LAMINECTOMY/DECOMPRESSION MICRODISCECTOMY Right 06/26/2012   Procedure: LUMBAR LAMINECTOMY/DECOMPRESSION MICRODISCECTOMY;  Surgeon: Jessy Oto, MD;  Location: [facility];  Service: Orthopedics;  Laterality: Right;  RIGHT L4-5 MICRODISCECTOMY  . LUMBAR LAMINECTOMY/DECOMPRESSION MICRODISCECTOMY N/A 05/29/2015   Procedure: RIGHT L2-3 MICRODISCECTOMY;  Surgeon: Jessy Oto, MD;  Location: [facility];  Service: Orthopedics;  Laterality: N/A;  . TOTAL ABDOMINAL HYSTERECTOMY  1975   partial  . UPPER GASTROINTESTINAL ENDOSCOPY     Family History  Problem Relation Age of Onset  . Colon cancer Mother 27  . Heart attack Father 74       heart attack  . Brain cancer Brother        tumor   . Heart disease Brother   . Heart disease Brother   . Kidney disease Daughter   . Esophageal cancer Neg Hx   . Stomach cancer Neg Hx    Social History   Tobacco Use  . Smoking status: Never Smoker  . Smokeless tobacco: Never Used  Substance Use Topics  . Alcohol use: No  . Drug use: Never     ROS Constitutional: Denies fever, chills, weight loss/gain, headaches, insomnia,  night sweats, and change in appetite. Does c/o fatigue. Eyes: Denies redness, blurred vision, diplopia, discharge, itchy, watery eyes.  ENT: Denies discharge, congestion, post nasal drip, epistaxis, sore throat, earache, hearing loss, dental pain, Tinnitus, Vertigo, Sinus pain, snoring.  Cardio: Denies chest pain, palpitations, irregular heartbeat, syncope, dyspnea, diaphoresis, orthopnea, PND, claudication, edema Respiratory: denies cough, dyspnea, DOE, pleurisy, hoarseness, laryngitis, wheezing.  Gastrointestinal: Denies dysphagia, heartburn, reflux, water brash, pain, cramps, nausea, vomiting, bloating, diarrhea, constipation, hematemesis, melena, hematochezia, jaundice, hemorrhoids Genitourinary: Denies dysuria, frequency, urgency, nocturia, hesitancy, discharge, hematuria, flank pain Breast: Breast lumps, nipple discharge, bleeding.  Musculoskeletal: Denies arthralgia, myalgia, stiffness, Jt. Swelling, pain, limp, and strain/sprain. Denies falls. Skin: Denies puritis, rash, hives, warts, acne, eczema, changing in skin lesion Neuro: No weakness, tremor, incoordination, spasms, paresthesia, pain Psychiatric: Denies confusion, memory loss, sensory loss. Denies Depression. Endocrine: Denies change in weight, skin, hair change, nocturia, and paresthesia, diabetic polys, visual blurring, hyper / hypo glycemic episodes.  Heme/Lymph: No excessive bleeding, bruising, enlarged lymph nodes.  Physical Exam  BP 128/80   Pulse 72   Temp 97.6 F (36.4 C)   Resp 16   Ht 5' 2.5" (1.588 m)   Wt 172 lb 9.6 oz (78.3 kg)   BMI 31.07 kg/m   General Appearance: Over nourished, well groomed and in no apparent distress.  Eyes: PERRLA, EOMs, conjunctiva no swelling or erythema, normal fundi and vessels. Sinuses: No frontal/maxillary tenderness ENT/Mouth: EACs patent / TMs  nl. Nares clear without erythema, swelling, mucoid  exudates. Oral hygiene is good. No  erythema, swelling, or exudate. Tongue normal, non-obstructing. Tonsils not swollen or erythematous. Hearing normal.  Neck: Supple, thyroid not palpable. No bruits, nodes or JVD. Respiratory: Respiratory effort normal.  BS equal and clear bilateral without rales, rhonci, wheezing or stridor. Cardio: Heart sounds are normal with regular rate and rhythm and no murmurs, rubs or gallops. Peripheral pulses are normal and equal bilaterally without edema. No aortic or femoral bruits. Chest: symmetric with normal excursions and percussion. Breasts: Symmetric, without lumps, nipple discharge, retractions, or fibrocystic changes.  Abdomen: Flat, soft with bowel sounds active. Nontender, no guarding, rebound, hernias, masses, or organomegaly.  Lymphatics: Non tender without lymphadenopathy.  Genitourinary:  Musculoskeletal: Full ROM all peripheral extremities, joint stability, 5/5 strength, and normal gait. Skin: Warm and dry without rashes, lesions, cyanosis, clubbing or  ecchymosis.  Neuro: Cranial nerves intact, reflexes equal bilaterally. Normal muscle tone, no cerebellar symptoms. Sensation intact.  Pysch: Alert and oriented X 3, normal affect, Insight and Judgment appropriate.   Assessment and Plan  1. Annual Preventative Screening Examination  2. Essential hypertension  - EKG 12-Lead - CBC with Differential/Platelet - COMPLETE METABOLIC PANEL WITH GFR - Magnesium - TSH - Microalbumin / creatinine urine ratio  3. Hyperlipidemia, mixed  - EKG 12-Lead - Lipid panel - TSH  4. Abnormal glucose  - EKG 12-Lead - Hemoglobin A1c - Insulin, random  5. Vitamin D deficiency  - VITAMIN D 25 Hydroxyl  6. Prediabetes  - EKG 12-Lead - Hemoglobin A1c - Insulin, random  7. Coronary artery disease due to lipid rich plaque  - EKG 12-Lead - Urinalysis, Routine w reflex microscopic - Lipid panel  8. CKD (chronic kidney disease) stage 3, GFR 30-59 ml/min  (HCC)  - PTH, intact and calcium - COMPLETE METABOLIC PANEL WITH GFR - Microalbumin / creatinine urine ratio  9. Gastroesophageal reflux disease   10. Chronic bronchitis, unspecified chronic bronchitis type ([place])   11. Screening for heart disease  - EKG 12-Lead - Lipid panel  12. FHx: heart disease   13. Atherosclerosis of aorta (HCC)  - EKG 12-Lead - Lipid panel  14. Screening for colorectal cancer  - POC Hemoccult Bld/Stl  15. Recurrent major depressive disorder, in partial remission ([place])    16. Medication management  - Urinalysis, Routine w reflex microscopic - POC Hemoccult Bld/Stl (3-Cd Home Screen); Future - CBC with Differential/Platelet - COMPLETE METABOLIC PANEL WITH GFR - Magnesium - Lipid panel - TSH - Hemoglobin A1c - Insulin, random - VITAMIN D 25 Hydroxy - Microalbumin / creatinine urine ratio          Patient was counseled in prudent diet to achieve/maintain BMI less than 25 for weight control, BP monitoring, regular exercise and medications. Discussed med's effects and SE's. Screening labs and tests as requested with regular follow-up as recommended. I discussed the assessment and treatment plan as above with the patient. The patient was provided an opportunity to ask questions and all were answered. The patient agreed with the plan and demonstrated an understanding of the instructions. Over 40 minutes of exam, counseling, chart review and high complex critical decision making was performed.          The patient was advised to call back or seek an in-person evaluation if the symptoms worsen or if the condition fails to improve as anticipated.       I provided  minutes of non-face-to-face time during this encounter and over 40 minutes of exam, counseling, chart review and  complex critical decision making  was performed  Kirtland Bouchard, MD

## 2018-09-12 ENCOUNTER — Telehealth: Payer: Self-pay | Admitting: *Deleted

## 2018-09-12 NOTE — Telephone Encounter (Signed)
Patient called and reported her right knee feels tight and swollen at times. The swelling is intermittent and is painful.  Per Dr Melford Aase, the patient was advised to call her orthopedic doctor.

## 2018-09-13 LAB — CBC WITH DIFFERENTIAL/PLATELET
Absolute Monocytes: 670 cells/uL (ref 200–950)
Basophils Absolute: 77 cells/uL (ref 0–200)
Basophils Relative: 1 %
Eosinophils Absolute: 400 cells/uL (ref 15–500)
Eosinophils Relative: 5.2 %
HCT: 43.9 % (ref 35.0–45.0)
Hemoglobin: 15 g/dL (ref 11.7–15.5)
Lymphs Abs: 1887 cells/uL (ref 850–3900)
MCH: 31.4 pg (ref 27.0–33.0)
MCHC: 34.2 g/dL (ref 32.0–36.0)
MCV: 91.8 fL (ref 80.0–100.0)
MPV: 11.1 fL (ref 7.5–12.5)
Monocytes Relative: 8.7 %
Neutro Abs: 4666 cells/uL (ref 1500–7800)
Neutrophils Relative %: 60.6 %
Platelets: 357 10*3/uL (ref 140–400)
RBC: 4.78 10*6/uL (ref 3.80–5.10)
RDW: 13 % (ref 11.0–15.0)
Total Lymphocyte: 24.5 %
WBC: 7.7 10*3/uL (ref 3.8–10.8)

## 2018-09-13 LAB — COMPLETE METABOLIC PANEL WITH GFR
AG Ratio: 1.4 (calc) (ref 1.0–2.5)
ALT: 16 U/L (ref 6–29)
AST: 20 U/L (ref 10–35)
Albumin: 4.4 g/dL (ref 3.6–5.1)
Alkaline phosphatase (APISO): 58 U/L (ref 37–153)
BUN/Creatinine Ratio: 20 (calc) (ref 6–22)
BUN: 20 mg/dL (ref 7–25)
CO2: 25 mmol/L (ref 20–32)
Calcium: 10.1 mg/dL (ref 8.6–10.4)
Chloride: 103 mmol/L (ref 98–110)
Creat: 0.98 mg/dL — ABNORMAL HIGH (ref 0.60–0.88)
GFR, Est African American: 63 mL/min/{1.73_m2} (ref >=60)
GFR, Est Non African American: 54 mL/min/{1.73_m2} — ABNORMAL LOW (ref >=60)
Globulin: 3.1 g/dL (calc) (ref 1.9–3.7)
Glucose, Bld: 84 mg/dL (ref 65–99)
Potassium: 4.4 mmol/L (ref 3.5–5.3)
Sodium: 139 mmol/L (ref 135–146)
Total Bilirubin: 0.7 mg/dL (ref 0.2–1.2)
Total Protein: 7.5 g/dL (ref 6.1–8.1)

## 2018-09-13 LAB — LIPID PANEL
Cholesterol: 182 mg/dL (ref <200)
HDL: 65 mg/dL (ref >=50)
LDL Cholesterol (Calc): 94 mg/dL (calc)
Non-HDL Cholesterol (Calc): 117 mg/dL (calc) (ref <130)
Total CHOL/HDL Ratio: 2.8 (calc) (ref <5.0)
Triglycerides: 133 mg/dL (ref <150)

## 2018-09-13 LAB — VITAMIN D 25 HYDROXY (VIT D DEFICIENCY, FRACTURES): Vit D, 25-Hydroxy: 56 ng/mL (ref 30–100)

## 2018-09-13 LAB — URINALYSIS, ROUTINE W REFLEX MICROSCOPIC
Bilirubin Urine: NEGATIVE
Glucose, UA: NEGATIVE
Hgb urine dipstick: NEGATIVE
Ketones, ur: NEGATIVE
Leukocytes,Ua: NEGATIVE
Nitrite: NEGATIVE
Protein, ur: NEGATIVE
Specific Gravity, Urine: 1.016 (ref 1.001–1.03)
pH: >=8.5 — AB (ref 5.0–8.0)

## 2018-09-13 LAB — INSULIN, RANDOM: Insulin: 7.8 u[IU]/mL

## 2018-09-13 LAB — TSH: TSH: 0.73 mIU/L (ref 0.40–4.50)

## 2018-09-13 LAB — PTH, INTACT AND CALCIUM
Calcium: 10.1 mg/dL (ref 8.6–10.4)
PTH: 22 pg/mL (ref 14–64)

## 2018-09-13 LAB — HEMOGLOBIN A1C
Hgb A1c MFr Bld: 5.8 % of total Hgb — ABNORMAL HIGH (ref <5.7)
Mean Plasma Glucose: 120 (calc)
eAG (mmol/L): 6.6 (calc)

## 2018-09-13 LAB — MICROALBUMIN / CREATININE URINE RATIO
Creatinine, Urine: 55 mg/dL (ref 20–275)
Microalb Creat Ratio: 16 mcg/mg creat (ref <30)
Microalb, Ur: 0.9 mg/dL

## 2018-09-13 LAB — MAGNESIUM: Magnesium: 2.1 mg/dL (ref 1.5–2.5)

## 2018-09-15 ENCOUNTER — Encounter: Payer: Self-pay | Admitting: Internal Medicine

## 2018-09-17 DIAGNOSIS — M150 Primary generalized (osteo)arthritis: Secondary | ICD-10-CM | POA: Diagnosis not present

## 2018-09-17 DIAGNOSIS — M4726 Other spondylosis with radiculopathy, lumbar region: Secondary | ICD-10-CM | POA: Diagnosis not present

## 2018-09-17 DIAGNOSIS — M961 Postlaminectomy syndrome, not elsewhere classified: Secondary | ICD-10-CM | POA: Diagnosis not present

## 2018-09-17 DIAGNOSIS — G894 Chronic pain syndrome: Secondary | ICD-10-CM | POA: Diagnosis not present

## 2018-09-18 ENCOUNTER — Other Ambulatory Visit: Payer: Self-pay | Admitting: Internal Medicine

## 2018-09-18 DIAGNOSIS — Z79899 Other long term (current) drug therapy: Secondary | ICD-10-CM

## 2018-09-18 DIAGNOSIS — K219 Gastro-esophageal reflux disease without esophagitis: Secondary | ICD-10-CM

## 2018-09-18 MED ORDER — OMEPRAZOLE 40 MG PO CPDR: DELAYED_RELEASE_CAPSULE | ORAL | 1 refills | Status: DC

## 2018-09-19 ENCOUNTER — Other Ambulatory Visit: Payer: PPO

## 2018-09-26 ENCOUNTER — Telehealth: Payer: Self-pay | Admitting: Rheumatology

## 2018-09-26 NOTE — Telephone Encounter (Signed)
Patient scheduled for 09/27/18 at 2:00 pm.

## 2018-09-26 NOTE — Telephone Encounter (Signed)
Patient states she is having pain in her legs. Patient states the pain has been going for about 1 month. Patient states it has been more severe for the last week. Patient states it is interrupting her sleep. Patient states her feet are swelling. Patient states the legs and knees ache. Patient would like to know if the Alendronate would be causing the pain in her legs. Patient has been on the alendronate for several years. Please advise.

## 2018-09-26 NOTE — Telephone Encounter (Signed)
Patient will need appointment to evaluate.

## 2018-09-26 NOTE — Progress Notes (Signed)
Office Visit Note  Patient: [name] [name]             Date of Birth: 07-26-1935           MRN: [id number]             PCP: [name], MD Referring: [name], MD Visit Date: 09/27/2018 Occupation: @GUAROCC @  Subjective:  Pain in both knee joints   History of Present Illness: [name] [name] is a 83 y.o. female with history of osteoporosis, DDD, and osteoarthritis. She is taking fosamax 70 mg 1 tablet by mouth once weekly for management of osteoporosis.  She is having increased pain in both knee joints. She denies any knee joint swelling.   She has been using voltaren gel topically prn for pain relief.  She has also tried arnica cream.  She denies any hip or groin pain.  She continues to walk with a cane.  She denies any recent falls or injuries.  She has chronic lower back pain.  She has been walking for exercise.  She continues to see Dr. Louanne Skye.   Activities of Daily Living:  Patient reports morning stiffness for 0 none.   Patient Reports nocturnal pain.  Difficulty dressing/grooming: Denies Difficulty climbing stairs: Denies Difficulty getting out of chair: Denies Difficulty using hands for taps, buttons, cutlery, and/or writing: Denies  Review of Systems  Constitutional: Positive for fatigue.  HENT: Negative for mouth sores, mouth dryness and nose dryness.   Eyes: Negative for pain, visual disturbance and dryness.  Respiratory: Negative for cough, hemoptysis, shortness of breath and difficulty breathing.   Cardiovascular: Positive for swelling in legs/feet. Negative for chest pain, palpitations and hypertension.  Gastrointestinal: Negative for blood in stool, constipation and diarrhea.  Endocrine: Negative for excessive thirst and increased urination.  Genitourinary: Negative for difficulty urinating and painful urination.  Musculoskeletal: Positive for arthralgias, joint pain and joint swelling. Negative for myalgias, muscle weakness, morning stiffness, muscle  tenderness and myalgias.  Skin: Negative for color change, pallor, rash, hair loss, nodules/bumps, skin tightness, ulcers and sensitivity to sunlight.  Allergic/Immunologic: Negative for susceptible to infections.  Neurological: Negative for dizziness, headaches and weakness.  Hematological: Negative for bruising/bleeding tendency and swollen glands.  Psychiatric/Behavioral: Positive for sleep disturbance. Negative for depressed mood. The patient is not nervous/anxious.     PMFS History:  Patient Active Problem List   Diagnosis Date Noted  . Nausea & vomiting 05/13/2018  . Unresponsiveness 04/14/2018  . Hyperkalemia 04/13/2018  . Abnormal glucose 03/01/2018  . FHx: heart disease 03/01/2018  . Therapeutic opioid-induced constipation (OIC) 12/21/2017  . Abnormal UGI series 12/21/2017  . CKD (chronic kidney disease) stage 3, GFR 30-59 ml/min (HCC) 09/27/2017  . T12 compression fracture ([place]) 09/26/2017  . Chronic bronchitis ([place]) 08/09/2017  . Atherosclerosis of aorta ([place]) 04/05/2017  . Anxiety state 06/08/2016  . Spinal stenosis, lumbar region, with neurogenic claudication 05/29/2015  . DDD (degenerative disc disease), lumbar 04/21/2015  . Hyperlipidemia, mixed 11/24/2014  . Gastroesophageal reflux disease 11/24/2014  . Depression, major, in partial remission ([place]) 09/26/2014  . Chronic pain syndrome 09/26/2014  . Coronary artery disease due to lipid rich plaque   . Osteoporosis 02/10/2014  . Prediabetes 08/14/2013  . Vitamin D deficiency 08/14/2013  . Screening for heart disease 08/14/2013  . MGUS (monoclonal gammopathy of unknown significance) 03/05/2013  . Vertebral compression fracture ([place]) 06/23/2012  . ESOPHAGEAL STRICTURE 08/28/2008  . Mild intermittent asthma without complication 18/56/3149  . Incarcerated recurrent paraesophageal hiatal hernia  s/p lap redo repair [id number] 01/31/2008  . Essential hypertension 07/25/2007    Past Medical History:  Diagnosis Date  .  Anxiety    takes Xanax daily as needed  . Asthma    Albuterol daily as needed  . Blood transfusion without reported diagnosis   . CAD (coronary artery disease)    LHC 2003 with 40% pLAD, 30% mLAD, 100% D1 (moderate vessel), 100%  D2 (small vessel), 95% mid small OM2.  Pt had PTCA to D1;  D2 and OM2 small vessels and tx medically;  Last Myoview (2012): anterior infarct seen with scar, no ischemia. EF normal. Med Rx // Myoview (12/14):  Low risk; ant scar w/ peri-infarct ischemia; EF 55% with ant AK // Myoview 5/16: EF 55-65, ant, apical scar, no ischemia   . Cataract   . Chronic back pain    HNP, DDD, spinal stenosis, vertebral compression fractures.   . Chronic nausea    takes Zofran daily as needed.  normal gastric emptying study 03/2013  . CKD (chronic kidney disease), stage III ([place]) 09/27/2017  . Constipation    felt to be functional. takes Senokot and Miralax daily.  treated with Linzess in past.   . DIVERTICULOSIS, COLON 08/28/2008   Qualifier: Diagnosis of  By: [name], [name], [name]    . Elevated LFTs 2015   probably med induced DILI, from Augmentin vs Pravastatin  . GERD (gastroesophageal reflux disease)    hx of esophageal stricture   . Hiatal hernia    Paraesophageal hernias. s/p fundoplications in 3267 and 04/2013  . History of bronchitis several of yrs ago  . History of colon polyps 03/2009, 03/2011   adenomatous colon polyps, no high grade dysplasia.   [name] History of vertigo    no meds  . Hyperlipidemia    takes Pravastatin daily  . Hypertension    takes Amlodipine,Metoprolol,and Losartan daily  . Nausea 03/2016  . Osteoporosis   . PONV (postoperative nausea and vomiting)    yrs ago  . Slow urinary stream    takes Rapaflo daily  . TIA (transient ischemic attack) 1995   no residual effects noted    Family History  Problem Relation Age of Onset  . Colon cancer Mother 42  . Heart attack Father 59       heart attack  . Brain cancer Brother        tumor   . Heart  disease Brother   . Heart disease Brother   . Kidney disease Daughter   . Esophageal cancer Neg Hx   . Stomach cancer Neg Hx    Past Surgical History:  Procedure Laterality Date  . APPENDECTOMY     patient unsure of date  . BACK SURGERY    . BLADDER REPAIR    . CHOLECYSTECTOMY     Patient unsure of date.  . CORONARY ANGIOPLASTY  11/16/2001   1 stent  . ESOPHAGEAL MANOMETRY N/A 03/25/2013   Procedure: ESOPHAGEAL MANOMETRY (EM);  Surgeon: Inda Castle, MD;  Location: [facility] ENDOSCOPY;  Service: Endoscopy;  Laterality: N/A;  . ESOPHAGOGASTRODUODENOSCOPY N/A 03/15/2013   Procedure: ESOPHAGOGASTRODUODENOSCOPY (EGD);  Surgeon: Jerene Bears, MD;  Location[facility] ENDOSCOPY;  Service: Endoscopy;  Laterality: N/A;  . ESOPHAGOGASTRODUODENOSCOPY N/A 12/25/2015   Procedure: ESOPHAGOGASTRODUODENOSCOPY (EGD);  Surgeon: Doran Stabler, MD;  Location: [facility] ENDOSCOPY;  Service: Endoscopy;  Laterality: N/A;  . EYE SURGERY  11/2000   bilateral cataracts with lens implant  . FIXATION KYPHOPLASTY LUMBAR SPINE  X  2   "L3-4"  . HEMORRHOID SURGERY    . HIATAL HERNIA REPAIR  06/03/2011   Procedure: LAPAROSCOPIC REPAIR OF HIATAL HERNIA;  Surgeon: Adin Hector, MD;  Location: [facility] ORS;  Service: General;  Laterality: N/A;  . HIATAL HERNIA REPAIR N/A 04/24/2013   Procedure: LAPAROSCOPIC  REPAIR RECURRENT PARASOPHAGEAL HIATAL HERNIA WITH FUNDOPLICATION;  Surgeon: Adin Hector, MD;  Location: [facility];  Service: General;  Laterality: N/A;  . INSERTION OF MESH N/A 04/24/2013   Procedure: INSERTION OF MESH;  Surgeon: Adin Hector, MD;  Location: [facility];  Service: General;  Laterality: N/A;  . IR KYPHO THORACIC WITH BONE BIOPSY  09/29/2017  . IR KYPHO THORACIC WITH BONE BIOPSY  11/03/2017  . LAPAROSCOPIC LYSIS OF ADHESIONS N/A 04/24/2013   Procedure: LAPAROSCOPIC LYSIS OF ADHESIONS;  Surgeon: Adin Hector, MD;  Location: [facility];  Service: General;  Laterality: N/A;  . LAPAROSCOPIC NISSEN FUNDOPLICATION   3/41/9622   Procedure: LAPAROSCOPIC NISSEN FUNDOPLICATION;  Surgeon: Adin Hector, MD;  Location: [facility];  Service: General;  Laterality: N/A;  . LUMBAR FUSION  12/07/2015   Right L2-3 facetectomy, posterolateral fusion L2-3 with pedicle screws, rods, local bone graft, Vivigen cancellous chips. Fusion extended to the L1 level with pedicle screws and rods  . LUMBAR LAMINECTOMY/DECOMPRESSION MICRODISCECTOMY Right 06/26/2012   Procedure: LUMBAR LAMINECTOMY/DECOMPRESSION MICRODISCECTOMY;  Surgeon: Jessy Oto, MD;  Location: [place];  Service: Orthopedics;  Laterality: Right;  RIGHT L4-5 MICRODISCECTOMY  . LUMBAR LAMINECTOMY/DECOMPRESSION MICRODISCECTOMY N/A 05/29/2015   Procedure: RIGHT L2-3 MICRODISCECTOMY;  Surgeon: Jessy Oto, MD;  Location: [place];  Service: Orthopedics;  Laterality: N/A;  . TOTAL ABDOMINAL HYSTERECTOMY  1975   partial  . UPPER GASTROINTESTINAL ENDOSCOPY     Social History   Social History Narrative   Lives with husband.   Right-handed.   3 cups caffeine per day.   Immunization History  Administered Date(s) Administered  . DT 05/06/2014  . Influenza Split 02/07/2011  . Influenza Whole 02/09/2009, 01/19/2010, 02/13/2012  . Influenza, High Dose Seasonal PF 03/22/2013, 01/15/2014, 02/27/2015, 01/14/2016, 01/18/2017, 02/14/2018  . Pneumococcal Conjugate-13 05/06/2014  . Pneumococcal Polysaccharide-23 02/09/2011  . Pneumococcal-Unspecified 02/06/2010, 02/09/2012  . Td 05/10/2003     Objective: Vital Signs: BP 132/71 (BP Location: Left Arm, Patient Position: Sitting, Cuff Size: Normal)   Pulse 71   Resp 18   Ht 5' 3.5" (1.613 m)   Wt 173 lb 6.4 oz (78.7 kg)   BMI 30.23 kg/m    Physical Exam Vitals signs and nursing note reviewed.  Constitutional:      Appearance: She is well-developed.  HENT:     Head: Normocephalic and atraumatic.  Eyes:     Conjunctiva/sclera: Conjunctivae normal.  Neck:     Musculoskeletal: Normal range of motion.  Cardiovascular:      Rate and Rhythm: Normal rate and regular rhythm.     Heart sounds: Normal heart sounds.  Pulmonary:     Effort: Pulmonary effort is normal.     Breath sounds: Normal breath sounds.  Abdominal:     General: Bowel sounds are normal.     Palpations: Abdomen is soft.  Lymphadenopathy:     Cervical: No cervical adenopathy.  Skin:    General: Skin is warm and dry.     Capillary Refill: Capillary refill takes less than 2 seconds.  Neurological:     Mental Status: She is alert and oriented to person, place, and time.  Psychiatric:  Behavior: Behavior normal.      Musculoskeletal Exam: C-spine good ROM.  Limited ROM of lumbar spine with discomfort.  Shoulder joints, elbow joints, wrist joints, MCPs, PIPs, and DIPs good ROM with no synovitis.  Complete fist formation bilaterally.  DIP synovial thickening consistent with osteoarthritis.  Hip joints good ROM with no discomfort.  Knee joints good ROM with bilateral knee joint crepitus.  No warmth or effusion of knee joints.  No tenderness or swelling of ankle joints.  No tenderness over trochanteric bursa bilaterally.   CDAI Exam: CDAI Score: Not documented Patient Global Assessment: Not documented; Provider Global Assessment: Not documented Swollen: Not documented; Tender: Not documented Joint Exam   Not documented   There is currently no information documented on the homunculus. Go to the Rheumatology activity and complete the homunculus joint exam.  Investigation: No additional findings.  Imaging: Xr Knee 3 View Left  Result Date: 09/27/2018 Moderate to severe medial compartment narrowing with chondrocalcinosis was noted.  Moderate patellofemoral narrowing was noted. Impression: These findings are consistent with moderate osteoarthritis and moderate chondromalacia patella with chondrocalcinosis.  Xr Knee 3 View Right  Result Date: 09/27/2018 Moderate lateral compartment narrowing was noted.  Moderate patellofemoral narrowing  was noted.  No chondrocalcinosis was noted. Impression: These findings are consistent with moderate osteoarthritis and moderate chondromalacia patella.   Recent Labs: Lab Results  Component Value Date   WBC 7.7 09/11/2018   HGB 15.0 09/11/2018   PLT 357 09/11/2018   NA 139 09/11/2018   K 4.4 09/11/2018   CL 103 09/11/2018   CO2 25 09/11/2018   GLUCOSE 84 09/11/2018   BUN 20 09/11/2018   CREATININE 0.98 (H) 09/11/2018   BILITOT 0.7 09/11/2018   ALKPHOS 37 (L) 05/15/2018   AST 20 09/11/2018   ALT 16 09/11/2018   PROT 7.5 09/11/2018   ALBUMIN 3.0 (L) 05/15/2018   CALCIUM 10.1 09/11/2018   CALCIUM 10.1 09/11/2018   GFRAA 63 09/11/2018    Speciality Comments: No specialty comments available.  Procedures:  Large Joint Inj: bilateral knee on 09/27/2018 2:58 PM Indications: pain Details: 27 G 1.5 in needle, medial approach  Arthrogram: No  Medications (Right): 1.5 mL lidocaine 1 %; 40 mg triamcinolone acetonide 40 MG/ML Aspirate (Right): 0 mL Medications (Left): 1.5 mL lidocaine 1 %; 40 mg triamcinolone acetonide 40 MG/ML Aspirate (Left): 0 mL Outcome: tolerated well, no immediate complications Procedure, treatment alternatives, risks and benefits explained, specific risks discussed. Consent was given by the patient. Immediately prior to procedure a time out was called to verify the correct patient, procedure, equipment, support staff and site/side marked as required. Patient was prepped and draped in the usual sterile fashion.     Allergies: Ace inhibitors; Aspartame; Atorvastatin; Biaxin [clarithromycin]; Daliresp [roflumilast]; Erythromycin; and Levofloxacin   Assessment / Plan:     Visit Diagnoses: Age-related osteoporosis without current pathological fracture: She takes Fosamax 70 mg 1 tablet by mouth once weekly.  She has been tolerating it well without side effects. She denies any recent falls or fractures.  Her most recent DEXA on 3/18 was performed on a different  scanner and revealed: BMD: 0.652 T-score -2.8. Her PCP ordered a future DEXA on 11/12/18. She would like to discuss discontinuing Fosamax due to thinking fosamax is contributing to her arthralgias.  We will discuss DEXA results once completed.   History of vitamin D deficiency: She takes a vitamin D supplement daily.   History of vertebral compression fracture: She has not had any recent  fractures.  Primary osteoarthritis of both knees: She presents today with bilateral knee joint pain.  She has no warmth or effusion.  She has good ROM with bilateral knee joint crepitus.  She has not had any recent falls or injuries.  She has no mechanical symptoms.  X-rays of both knee joints were updated today.  Chondrocalcinosis was noted.  Bilateral knee joint cortisone injections were performed.  She tolerated the procedure well.  Aftercare discussed.  Procedure noted above.  She was given a handout of knee joint exercises.    Chronic pain of both knees -She is having increased pain in both knee joints.  She has no warmth or effusion on exam.  X-rays of both knees were updated today. Plan: XR KNEE 3 VIEW RIGHT, XR KNEE 3 VIEW LEFT  DDD (degenerative disc disease), lumbar: Chronic pain.  She is followed by Dr. Louanne Skye.  Facet arthropathy  H/O kyphoplasty  Other medical conditions are listed as follows:   History of gastroesophageal reflux (GERD)  History of hypercholesterolemia  History of asthma  History of cholecystectomy  History of appendectomy  History of hypertension  History of anxiety  History of neuropathy   Orders: Orders Placed This Encounter  Procedures  . Large Joint Inj  . XR KNEE 3 VIEW RIGHT  . XR KNEE 3 VIEW LEFT   No orders of the defined types were placed in this encounter.   Face-to-face time spent with patient was 30 minutes. Greater than 50% of time was spent in counseling and coordination of care.  Follow-Up Instructions: Return in about 6 months (around  03/30/2019) for Osteoporosis, Osteoarthritis, DDD.   Ofilia Neas, PA-C  Note - This record has been created using Dragon software.  Chart creation errors have been sought, but may not always  have been located. Such creation errors do not reflect on  the standard of medical care.

## 2018-09-26 NOTE — Telephone Encounter (Signed)
Patient called stating she has been taking Alendronate once a week.  Patient states her legs from her knees to her feet are achy, as well as her left arm from her shoulder to elbow.  Patient states "the medication is causing her to hurt" and wants to discontinue taking it.  Patient states she is due to take another pill on Saturday 09/26/18 and doesn't plan to take it.  Patient requested a return call.

## 2018-09-27 ENCOUNTER — Ambulatory Visit (INDEPENDENT_AMBULATORY_CARE_PROVIDER_SITE_OTHER): Payer: PPO

## 2018-09-27 ENCOUNTER — Other Ambulatory Visit: Payer: Self-pay

## 2018-09-27 ENCOUNTER — Ambulatory Visit: Payer: PPO | Admitting: Physician Assistant

## 2018-09-27 ENCOUNTER — Ambulatory Visit: Payer: Self-pay

## 2018-09-27 ENCOUNTER — Encounter: Payer: Self-pay | Admitting: Physician Assistant

## 2018-09-27 VITALS — BP 132/71 | HR 71 | Resp 18 | Ht 63.5 in | Wt 173.4 lb

## 2018-09-27 DIAGNOSIS — M1120 Other chondrocalcinosis, unspecified site: Secondary | ICD-10-CM

## 2018-09-27 DIAGNOSIS — Z9049 Acquired absence of other specified parts of digestive tract: Secondary | ICD-10-CM

## 2018-09-27 DIAGNOSIS — Z8709 Personal history of other diseases of the respiratory system: Secondary | ICD-10-CM

## 2018-09-27 DIAGNOSIS — M810 Age-related osteoporosis without current pathological fracture: Secondary | ICD-10-CM | POA: Diagnosis not present

## 2018-09-27 DIAGNOSIS — G8929 Other chronic pain: Secondary | ICD-10-CM

## 2018-09-27 DIAGNOSIS — M25562 Pain in left knee: Secondary | ICD-10-CM

## 2018-09-27 DIAGNOSIS — M47819 Spondylosis without myelopathy or radiculopathy, site unspecified: Secondary | ICD-10-CM | POA: Diagnosis not present

## 2018-09-27 DIAGNOSIS — M5136 Other intervertebral disc degeneration, lumbar region: Secondary | ICD-10-CM

## 2018-09-27 DIAGNOSIS — Z8781 Personal history of (healed) traumatic fracture: Secondary | ICD-10-CM | POA: Diagnosis not present

## 2018-09-27 DIAGNOSIS — Z8639 Personal history of other endocrine, nutritional and metabolic disease: Secondary | ICD-10-CM

## 2018-09-27 DIAGNOSIS — Z8669 Personal history of other diseases of the nervous system and sense organs: Secondary | ICD-10-CM

## 2018-09-27 DIAGNOSIS — Z8719 Personal history of other diseases of the digestive system: Secondary | ICD-10-CM

## 2018-09-27 DIAGNOSIS — M25561 Pain in right knee: Secondary | ICD-10-CM | POA: Diagnosis not present

## 2018-09-27 DIAGNOSIS — Z8679 Personal history of other diseases of the circulatory system: Secondary | ICD-10-CM

## 2018-09-27 DIAGNOSIS — Z8659 Personal history of other mental and behavioral disorders: Secondary | ICD-10-CM

## 2018-09-27 DIAGNOSIS — M170 Bilateral primary osteoarthritis of knee: Secondary | ICD-10-CM | POA: Diagnosis not present

## 2018-09-27 DIAGNOSIS — Z9889 Other specified postprocedural states: Secondary | ICD-10-CM

## 2018-09-27 MED ORDER — TRIAMCINOLONE ACETONIDE 40 MG/ML IJ SUSP
40.0000 mg | INTRAMUSCULAR | Status: AC | PRN
  Administered 2018-09-27: 40 mg via INTRA_ARTICULAR

## 2018-09-27 MED ORDER — LIDOCAINE HCL 1 % IJ SOLN
1.5000 mL | INTRAMUSCULAR | Status: AC | PRN
  Administered 2018-09-27: 1.5 mL

## 2018-09-27 NOTE — Patient Instructions (Signed)
Knee Exercises  Ask your health care provider which exercises are safe for you. Do exercises exactly as told by your health care provider and adjust them as directed. It is normal to feel mild stretching, pulling, tightness, or discomfort as you do these exercises, but you should stop right away if you feel sudden pain or your pain gets worse. Do not begin these exercises until told by your health care provider.  STRETCHING AND RANGE OF MOTION EXERCISES  These exercises warm up your muscles and joints and improve the movement and flexibility of your knee. These exercises also help to relieve pain, numbness, and tingling.  Exercise A: Knee Extension, Prone  1. Lie on your abdomen on a bed.  2. Place your left / right knee just beyond the edge of the surface so your knee is not on the bed. You can put a towel under your left / right thigh just above your knee for comfort.  3. Relax your leg muscles and allow gravity to straighten your knee. You should feel a stretch behind your left / right knee.  4. Hold this position for __________ seconds.  5. Scoot up so your knee is supported between repetitions.  Repeat __________ times. Complete this stretch __________ times a day.  Exercise B: Knee Flexion, Active    1. Lie on your back with both knees straight. If this causes back discomfort, bend your left / right knee so your foot is flat on the floor.  2. Slowly slide your left / right heel back toward your buttocks until you feel a gentle stretch in the front of your knee or thigh.  3. Hold this position for __________ seconds.  4. Slowly slide your left / right heel back to the starting position.  Repeat __________ times. Complete this exercise __________ times a day.  Exercise C: Quadriceps, Prone    1. Lie on your abdomen on a firm surface, such as a bed or padded floor.  2. Bend your left / right knee and hold your ankle. If you cannot reach your ankle or pant leg, loop a belt around your foot and grab the belt  instead.  3. Gently pull your heel toward your buttocks. Your knee should not slide out to the side. You should feel a stretch in the front of your thigh and knee.  4. Hold this position for __________ seconds.  Repeat __________ times. Complete this stretch __________ times a day.  Exercise D: Hamstring, Supine  1. Lie on your back.  2. Loop a belt or towel over the ball of your left / right foot. The ball of your foot is on the walking surface, right under your toes.  3. Straighten your left / right knee and slowly pull on the belt to raise your leg until you feel a gentle stretch behind your knee.  ? Do not let your left / right knee bend while you do this.  ? Keep your other leg flat on the floor.  4. Hold this position for __________ seconds.  Repeat __________ times. Complete this stretch __________ times a day.  STRENGTHENING EXERCISES  These exercises build strength and endurance in your knee. Endurance is the ability to use your muscles for a long time, even after they get tired.  Exercise E: Quadriceps, Isometric    1. Lie on your back with your left / right leg extended and your other knee bent. Put a rolled towel or small pillow under your knee if told by   your health care provider.  2. Slowly tense the muscles in the front of your left / right thigh. You should see your kneecap slide up toward your hip or see increased dimpling just above the knee. This motion will push the back of the knee toward the floor.  3. For __________ seconds, keep the muscle as tight as you can without increasing your pain.  4. Relax the muscles slowly and completely.  Repeat __________ times. Complete this exercise __________ times a day.  Exercise F: Straight Leg Raises - Quadriceps  1. Lie on your back with your left / right leg extended and your other knee bent.  2. Tense the muscles in the front of your left / right thigh. You should see your kneecap slide up or see increased dimpling just above the knee. Your thigh may  even shake a bit.  3. Keep these muscles tight as you raise your leg 4-6 inches (10-15 cm) off the floor. Do not let your knee bend.  4. Hold this position for __________ seconds.  5. Keep these muscles tense as you lower your leg.  6. Relax your muscles slowly and completely after each repetition.  Repeat __________ times. Complete this exercise __________ times a day.  Exercise G: Hamstring, Isometric  1. Lie on your back on a firm surface.  2. Bend your left / right knee approximately __________ degrees.  3. Dig your left / right heel into the surface as if you are trying to pull it toward your buttocks. Tighten the muscles in the back of your thighs to dig as hard as you can without increasing any pain.  4. Hold this position for __________ seconds.  5. Release the tension gradually and allow your muscles to relax completely for __________ seconds after each repetition.  Repeat __________ times. Complete this exercise __________ times a day.  Exercise H: Hamstring Curls    If told by your health care provider, do this exercise while wearing ankle weights. Begin with __________ weights. Then increase the weight by 1 lb (0.5 kg) increments. Do not wear ankle weights that are more than __________.  1. Lie on your abdomen with your legs straight.  2. Bend your left / right knee as far as you can without feeling pain. Keep your hips flat against the floor.  3. Hold this position for __________ seconds.  4. Slowly lower your leg to the starting position.    Repeat __________ times. Complete this exercise __________ times a day.  Exercise I: Squats (Quadriceps)  1. Stand in front of a table, with your feet and knees pointing straight ahead. You may rest your hands on the table for balance but not for support.  2. Slowly bend your knees and lower your hips like you are going to sit in a chair.  ? Keep your weight over your heels, not over your toes.  ? Keep your lower legs upright so they are parallel with the table  legs.  ? Do not let your hips go lower than your knees.  ? Do not bend lower than told by your health care provider.  ? If your knee pain increases, do not bend as low.  3. Hold the squat position for __________ seconds.  4. Slowly push with your legs to return to standing. Do not use your hands to pull yourself to standing.  Repeat __________ times. Complete this exercise __________ times a day.  Exercise J: Wall Slides (Quadriceps)    1. Lean   your back against a smooth wall or door while you walk your feet out 18-24 inches (46-61 cm) from it.  2. Place your feet hip-width apart.  3. Slowly slide down the wall or door until your knees bend __________ degrees. Keep your knees over your heels, not over your toes. Keep your knees in line with your hips.  4. Hold for __________ seconds.  Repeat __________ times. Complete this exercise __________ times a day.  Exercise K: Straight Leg Raises - Hip Abductors  1. Lie on your side with your left / right leg in the top position. Lie so your head, shoulder, knee, and hip line up. You may bend your bottom knee to help you keep your balance.  2. Roll your hips slightly forward so your hips are stacked directly over each other and your left / right knee is facing forward.  3. Leading with your heel, lift your top leg 4-6 inches (10-15 cm). You should feel the muscles in your outer hip lifting.  ? Do not let your foot drift forward.  ? Do not let your knee roll toward the ceiling.  4. Hold this position for __________ seconds.  5. Slowly return your leg to the starting position.  6. Let your muscles relax completely after each repetition.  Repeat __________ times. Complete this exercise __________ times a day.  Exercise L: Straight Leg Raises - Hip Extensors  1. Lie on your abdomen on a firm surface. You can put a pillow under your hips if that is more comfortable.  2. Tense the muscles in your buttocks and lift your left / right leg about 4-6 inches (10-15 cm). Keep your knee  straight as you lift your leg.  3. Hold this position for __________ seconds.  4. Slowly lower your leg to the starting position.  5. Let your leg relax completely after each repetition.  Repeat __________ times. Complete this exercise __________ times a day.  This information is not intended to replace advice given to you by your health care provider. Make sure you discuss any questions you have with your health care provider.  Document Released: 03/09/2005 Document Revised: 01/18/2016 Document Reviewed: 03/01/2015  Elsevier Interactive Patient Education © 2018 [employer]

## 2018-10-09 ENCOUNTER — Other Ambulatory Visit: Payer: Self-pay | Admitting: *Deleted

## 2018-10-09 ENCOUNTER — Other Ambulatory Visit: Payer: Self-pay

## 2018-10-09 ENCOUNTER — Telehealth: Payer: Self-pay | Admitting: *Deleted

## 2018-10-09 DIAGNOSIS — Z79899 Other long term (current) drug therapy: Secondary | ICD-10-CM

## 2018-10-09 DIAGNOSIS — Z1211 Encounter for screening for malignant neoplasm of colon: Secondary | ICD-10-CM

## 2018-10-09 LAB — POC HEMOCCULT BLD/STL (HOME/3-CARD/SCREEN)
Card #2 Fecal Occult Blod, POC: NEGATIVE
Card #3 Fecal Occult Blood, POC: NEGATIVE
Fecal Occult Blood, POC: NEGATIVE

## 2018-10-09 MED ORDER — ALENDRONATE SODIUM 70 MG PO TABS: ORAL_TABLET | ORAL | 0 refills | Status: DC

## 2018-10-09 NOTE — Telephone Encounter (Signed)
ok 

## 2018-10-09 NOTE — Telephone Encounter (Signed)
Patient needs RF of Fosamax to [facility].

## 2018-10-09 NOTE — Telephone Encounter (Signed)
Last Visit: 09/27/18 Next Visit: 03/28/19 Labs: 09/11/18 Creat 0.98 GFR 54  Okay to refill Fosamax?

## 2018-10-09 NOTE — Addendum Note (Signed)
Addended by: Carole Binning on: 10/09/2018 03:50 PM   Modules accepted: Orders

## 2018-10-10 DIAGNOSIS — Z1211 Encounter for screening for malignant neoplasm of colon: Secondary | ICD-10-CM

## 2018-10-28 ENCOUNTER — Other Ambulatory Visit: Payer: Self-pay | Admitting: Internal Medicine

## 2018-11-12 ENCOUNTER — Ambulatory Visit
Admission: RE | Admit: 2018-11-12 | Discharge: 2018-11-12 | Disposition: A | Discharge status: Home / Self Care | Payer: PPO | Source: Ambulatory Visit | Attending: Adult Health | Admitting: Adult Health

## 2018-11-12 ENCOUNTER — Other Ambulatory Visit: Payer: Self-pay

## 2018-11-12 DIAGNOSIS — M810 Age-related osteoporosis without current pathological fracture: Secondary | ICD-10-CM

## 2018-11-12 DIAGNOSIS — Z780 Asymptomatic menopausal state: Secondary | ICD-10-CM | POA: Diagnosis not present

## 2018-11-12 DIAGNOSIS — M85832 Other specified disorders of bone density and structure, left forearm: Secondary | ICD-10-CM | POA: Diagnosis not present

## 2018-11-13 ENCOUNTER — Ambulatory Visit: Payer: PPO | Admitting: Internal Medicine

## 2018-11-20 ENCOUNTER — Telehealth: Payer: Self-pay | Admitting: *Deleted

## 2018-11-20 NOTE — Telephone Encounter (Signed)
Went over Dexa scan results with the patient.

## 2018-12-03 ENCOUNTER — Ambulatory Visit: Payer: Self-pay | Admitting: Adult Health

## 2018-12-05 ENCOUNTER — Telehealth: Payer: Self-pay | Admitting: *Deleted

## 2018-12-05 NOTE — Telephone Encounter (Signed)
-----   Message from [name], MD sent at 12/04/2018  1:19 PM EDT ----- Please notify patient that we reviewed the bone density results.  She had mild deterioration in the bone density.  Overall the bone density is a stable.  At this point she should continue Fosamax. [name], MD  ----- Message ----- From: [name], NP Sent: 11/13/2018  10:00 AM EDT To: [name], MD  Dr. Estanislado Pandy,    I am forwarding our mutual patients recent DEXA results to you at her request per your discussion with her regarding fosamax. We appreciate your assistance in providing excellent care for our patient.   Sincerely,  [name], NP-C

## 2018-12-05 NOTE — Telephone Encounter (Signed)
Patient advised Dr Estanislado Pandy reviewed the bone density results. Patient advised she had mild deterioration in the bone density. Overall the bone density is a stable. Patient advised per Dr. Estanislado Pandy at this point she should continue Fosamax.

## 2018-12-10 DIAGNOSIS — M4726 Other spondylosis with radiculopathy, lumbar region: Secondary | ICD-10-CM | POA: Diagnosis not present

## 2018-12-10 DIAGNOSIS — M150 Primary generalized (osteo)arthritis: Secondary | ICD-10-CM | POA: Diagnosis not present

## 2018-12-10 DIAGNOSIS — G894 Chronic pain syndrome: Secondary | ICD-10-CM | POA: Diagnosis not present

## 2018-12-10 DIAGNOSIS — M961 Postlaminectomy syndrome, not elsewhere classified: Secondary | ICD-10-CM | POA: Diagnosis not present

## 2018-12-11 ENCOUNTER — Encounter: Payer: Self-pay | Admitting: Physician Assistant

## 2018-12-11 ENCOUNTER — Other Ambulatory Visit: Payer: Self-pay

## 2018-12-11 ENCOUNTER — Ambulatory Visit (INDEPENDENT_AMBULATORY_CARE_PROVIDER_SITE_OTHER): Payer: PPO | Admitting: Physician Assistant

## 2018-12-11 ENCOUNTER — Ambulatory Visit (INDEPENDENT_AMBULATORY_CARE_PROVIDER_SITE_OTHER): Payer: PPO

## 2018-12-11 DIAGNOSIS — M25551 Pain in right hip: Secondary | ICD-10-CM

## 2018-12-11 DIAGNOSIS — M79604 Pain in right leg: Secondary | ICD-10-CM | POA: Diagnosis not present

## 2018-12-11 MED ORDER — DIAZEPAM 5 MG PO TABS: ORAL_TABLET | ORAL | 0 refills | Status: DC

## 2018-12-11 NOTE — Progress Notes (Signed)
Office Visit Note   Patient: [name] [name]           Date of Birth: 1935-09-04           MRN: [id number] Visit Date: 12/11/2018              Requested by: [name], [address] [place],  [place] [address] PCP: [name], MD   Assessment & Plan: Visit Diagnoses:  1. Pain in right leg     Plan:  Due to patient's continued groin pain is becoming progressively worse recommend MRI of the right hip to rule out articular cartilage loss as a source of her pain or muscle skeletal source of pain.  I have her follow-up after the MRI to go over results discuss further treatment.  Questions encouraged and answered.  She is claustrophobic therefore we will send in some Valium for her to take prior to the MRI obtain an open MRI.  Follow-Up Instructions: Return in about 2 weeks (around 12/25/2018) for after MRI.   Orders:  Orders Placed This Encounter  Procedures  . XR Lumbar Spine 2-3 Views   Meds ordered this encounter  Medications  . DISCONTD: diazepam (VALIUM) 5 MG tablet    Sig: TAKE ONE TAB ONE HOUR PRIOR TO MRI REPEAT AS NEEDED #2 . ZERO REFILLS    Dispense:  2 tablet    Refill:  0  . diazepam (VALIUM) 5 MG tablet    Sig: TAKE ONE TAB ONE HOUR PRIOR TO MRI REPEAT AS NEEDED #2 . ZERO REFILLS    Dispense:  2 tablet    Refill:  0      Procedures: No procedures performed   Clinical Data: No additional findings.   Subjective: Chief Complaint  Patient presents with  . Right Leg - Pain  . Right Knee - Pain    HPI [name] returns today due to right hip pain that is getting progressively worse.  Pain does radiate down to the knee.  She has had an intra-articular injection in the hip that actually helped some of her pain.  However he had a CT scan of her abdomen which did include her hip on the CT scan the hip appeared to be well preserved.  She is now walking with a cane due to the hip pain.  She has had no new injury. Review of Systems  Negative for fevers or chills.  Objective: Vital Signs: There were no vitals taken for this visit.  Physical Exam Constitutional:      Appearance: She is not ill-appearing or diaphoretic.  Pulmonary:     Effort: Pulmonary effort is normal.  Neurological:     Mental Status: She is alert and oriented to person, place, and time.     Ortho Exam Bilateral hips she has good range of motion of both hips however internal rotation of the right hip causes severe pain in the groin region.  She has tenderness over the right trochanteric region.  Right knee good range of motion with slight patellofemoral crepitus mild tenderness along the medial joint line.  No instability valgus varus stressing. Specialty Comments:  No specialty comments available.  Imaging: No results found.   PMFS History: Patient Active Problem List   Diagnosis Date Noted  . Nausea & vomiting 05/13/2018  . Unresponsiveness 04/14/2018  . Hyperkalemia 04/13/2018  . Abnormal glucose 03/01/2018  . FHx: heart disease 03/01/2018  . Therapeutic opioid-induced constipation (OIC) 12/21/2017  . Abnormal UGI  series 12/21/2017  . CKD (chronic kidney disease) stage 3, GFR 30-59 ml/min (HCC) 09/27/2017  . T12 compression fracture ([place]) 09/26/2017  . Chronic bronchitis ([place]) 08/09/2017  . Atherosclerosis of aorta ([place]) 04/05/2017  . Anxiety state 06/08/2016  . Spinal stenosis, lumbar region, with neurogenic claudication 05/29/2015  . DDD (degenerative disc disease), lumbar 04/21/2015  . Hyperlipidemia, mixed 11/24/2014  . Gastroesophageal reflux disease 11/24/2014  . Depression, major, in partial remission ([place]) 09/26/2014  . Chronic pain syndrome 09/26/2014  . Coronary artery disease due to lipid rich plaque   . Osteoporosis 02/10/2014  . Prediabetes 08/14/2013  . Vitamin D deficiency 08/14/2013  . Screening for heart disease 08/14/2013  . MGUS (monoclonal gammopathy of unknown significance) 03/05/2013  . Vertebral  compression fracture ([place]) 06/23/2012  . ESOPHAGEAL STRICTURE 08/28/2008  . Mild intermittent asthma without complication 00/17/4944  . Incarcerated recurrent paraesophageal hiatal hernia s/p lap redo repair [id number] 01/31/2008  . Essential hypertension 07/25/2007   Past Medical History:  Diagnosis Date  . Anxiety    takes Xanax daily as needed  . Asthma    Albuterol daily as needed  . Blood transfusion without reported diagnosis   . CAD (coronary artery disease)    LHC 2003 with 40% pLAD, 30% mLAD, 100% D1 (moderate vessel), 100%  D2 (small vessel), 95% mid small OM2.  Pt had PTCA to D1;  D2 and OM2 small vessels and tx medically;  Last Myoview (2012): anterior infarct seen with scar, no ischemia. EF normal. Med Rx // Myoview (12/14):  Low risk; ant scar w/ peri-infarct ischemia; EF 55% with ant AK // Myoview 5/16: EF 55-65, ant, apical scar, no ischemia   . Cataract   . Chronic back pain    HNP, DDD, spinal stenosis, vertebral compression fractures.   . Chronic nausea    takes Zofran daily as needed.  normal gastric emptying study 03/2013  . CKD (chronic kidney disease), stage III ([place]) 09/27/2017  . Constipation    felt to be functional. takes Senokot and Miralax daily.  treated with Linzess in past.   . DIVERTICULOSIS, COLON 08/28/2008   Qualifier: Diagnosis of  By: [name], [name], [name]    . Elevated LFTs 2015   probably med induced DILI, from Augmentin vs Pravastatin  . GERD (gastroesophageal reflux disease)    hx of esophageal stricture   . Hiatal hernia    Paraesophageal hernias. s/p fundoplications in 3846 and 04/2013  . History of bronchitis several of yrs ago  . History of colon polyps 03/2009, 03/2011   adenomatous colon polyps, no high grade dysplasia.   [name] History of vertigo    no meds  . Hyperlipidemia    takes Pravastatin daily  . Hypertension    takes Amlodipine,Metoprolol,and Losartan daily  . Nausea 03/2016  . Osteoporosis   . PONV (postoperative nausea and  vomiting)    yrs ago  . Slow urinary stream    takes Rapaflo daily  . TIA (transient ischemic attack) 1995   no residual effects noted    Family History  Problem Relation Age of Onset  . Colon cancer Mother 25  . Heart attack Father 63       heart attack  . Brain cancer Brother        tumor   . Heart disease Brother   . Heart disease Brother   . Kidney disease Daughter   . Esophageal cancer Neg Hx   . Stomach cancer Neg Hx     Past  Surgical History:  Procedure Laterality Date  . APPENDECTOMY     patient unsure of date  . BACK SURGERY    . BLADDER REPAIR    . CHOLECYSTECTOMY     Patient unsure of date.  . CORONARY ANGIOPLASTY  11/16/2001   1 stent  . ESOPHAGEAL MANOMETRY N/A 03/25/2013   Procedure: ESOPHAGEAL MANOMETRY (EM);  Surgeon: Inda Castle, MD;  Location: [facility];  Service: Endoscopy;  Laterality: N/A;  . ESOPHAGOGASTRODUODENOSCOPY N/A 03/15/2013   Procedure: ESOPHAGOGASTRODUODENOSCOPY (EGD);  Surgeon: Jerene Bears, MD;  [facility] END[facility]COPY;  Service: Endoscopy;  Laterality: N/A;  . ESOPHAGOGASTRODUODENOSCOPY N/A 12/25/2015   Procedure: ESOPHAGOGASTRODUODENOSCOPY (EGD);  Surgeon: Doran Stabler, MD;  Location: [facility] ENDOSCOPY;  Service: Endoscopy;  Laterality: N/A;  . EYE SURGERY  11/2000   bilateral cataracts with lens implant  . FIXATION KYPHOPLASTY LUMBAR SPINE  X 2   "L3-4"  . HEMORRHOID SURGERY    . HIATAL HERNIA REPAIR  06/03/2011   Procedure: LAPAROSCOPIC REPAIR OF HIATAL HERNIA;  Surgeon: Adin Hector, MD;  Location: [facility];  Service: General;  Laterality: N/A;  . HIATAL HERNIA REPAIR N/A 04/24/2013   Procedure: LAPAROSCOPIC  REPAIR RECURRENT PARASOPHAGEAL HIATAL HERNIA WITH FUNDOPLICATION;  Surgeon: Adin Hector, MD;  Location: [facility];  Service: General;  Laterality: N/A;  . INSERTION OF MESH N/A 04/24/2013   Procedure: INSERTION OF MESH;  Surgeon: Adin Hector, MD;  Location: [facility];  Service: General;  Laterality: N/A;  . IR KYPHO  THORACIC WITH BONE BIOPSY  09/29/2017  . IR KYPHO THORACIC WITH BONE BIOPSY  11/03/2017  . LAPAROSCOPIC LYSIS OF ADHESIONS N/A 04/24/2013   Procedure: LAPAROSCOPIC LYSIS OF ADHESIONS;  Surgeon: Adin Hector, MD;  Location: [facility];  Service: General;  Laterality: N/A;  . LAPAROSCOPIC NISSEN FUNDOPLICATION  0/25/8527   Procedure: LAPAROSCOPIC NISSEN FUNDOPLICATION;  Surgeon: Adin Hector, MD;  Location: [facility];  Service: General;  Laterality: N/A;  . LUMBAR FUSION  12/07/2015   Right L2-3 facetectomy, posterolateral fusion L2-3 with pedicle screws, rods, local bone graft, Vivigen cancellous chips. Fusion extended to the L1 level with pedicle screws and rods  . LUMBAR LAMINECTOMY/DECOMPRESSION MICRODISCECTOMY Right 06/26/2012   Procedure: LUMBAR LAMINECTOMY/DECOMPRESSION MICRODISCECTOMY;  Surgeon: Jessy Oto, MD;  Location: [facility];  Service: Orthopedics;  Laterality: Right;  RIGHT L4-5 MICRODISCECTOMY  . LUMBAR LAMINECTOMY/DECOMPRESSION MICRODISCECTOMY N/A 05/29/2015   Procedure: RIGHT L2-3 MICRODISCECTOMY;  Surgeon: Jessy Oto, MD;  Location: [facility];  Service: Orthopedics;  Laterality: N/A;  . TOTAL ABDOMINAL HYSTERECTOMY  1975   partial  . UPPER GASTROINTESTINAL ENDOSCOPY     Social History   Occupational History  . Occupation: Retired    Fish farm manager: RETIRED    Comment: worked at [employer]  . Smoking status: Never Smoker  . Smokeless tobacco: Never Used  Substance and Sexual Activity  . Alcohol use: No  . Drug use: Never  . Sexual activity: Not on file

## 2018-12-12 NOTE — Progress Notes (Signed)
FOLLOW UP  Assessment and Plan:   ASCAD/stents Control blood pressure, cholesterol, glucose, increase exercise.  Followed by cardiology  COPD Continue inhalers; well managed without active flare Avoid triggers Followed by Dr. Melvyn Novas  Hypertension Well controlled with current medications  Monitor blood pressure at home; patient to call if consistently greater than 130/80 Continue DASH diet.   Reminder to go to the ER if any CP, SOB, nausea, dizziness, severe HA, changes vision/speech, left arm numbness and tingling and jaw pain.  Cholesterol Currently at goal by lifestyle Continue low cholesterol diet and exercise.  Defer lipid panel to CPE  Prediabetes Continue diet and exercise.  Perform daily foot/skin check, notify office of any concerning changes.  Check A1C at CPE; monitor weight, serum glucose  Vitamin D Def Essentially at goal at last visit; continue supplementation to maintain goal of 60-100 Defer Vit D level  CKD III Increase fluids, avoid NSAIDS, monitor sugars, will monitor  GERD/esophageal stricture Well managed on current medications -omeprazole 40 mg daily, carafate PRN Follow up GI as recommended Discussed diet, avoiding triggers and other lifestyle changes  Osteoporosis Discussed concerns with alendronate with esophageal stricture/dysphagia Managed by Dr. Estanislado Pandy - encouraged her to discuss injectable such as prolia  Encouraged vit D, high Ca diet, resistance exercises reviewed  Left ear sore Recurrent persistent; has upcoming derm visit; advised to discuss and possible biopsy  Continue diet and meds as discussed. Further disposition pending results of labs. Discussed med's effects and SE's.   Over 30 minutes of exam, counseling, chart review, and critical decision making was performed.   Future Appointments  Date Time Provider [place]  03/21/2019 10:30 AM [name], MD [facility] None  03/28/2019 10:40 AM Ofilia Neas, PA-C  [facility] None  10/16/2019 10:00 AM [name], MD [facility] None    ----------------------------------------------------------------------------------------------------------------------  HPI 83 y.o. female  presents for 3 month follow up on ASCAD/stents, hypertension, cholesterol, glucose, anxiety/depression and vitamin D deficiency.   She has COPD/asthma followed by Dr. Melvyn Novas; reports currently well controlled by inhalers (Breo daily, PRN duoneb, albuterol)  Patient has a chronic pain syndrome and is followed by Dr Nicholaus Bloom and Dr Louanne Skye.  In 2014 , she had Lumbar Disc surgery and then in Jan 2017 had lumbar decompression followed later in July 2017 a Lumbar Fusion by Dr Louanne Skye. Also in 2014, she underwent Kyphoplasty for L3/L4 Compression Fx's. Getting Right hip steroid injection with Dr. Louanne Skye. She is off of opioids following episodes of nausea/vomiting leading to hostpitalization for dehydration attributed to complications from chronic pain medication use. She d/c'd opioids and reports doing well, using tylenol, gabapentin, voltaren and OTC horse liniment which is helpful.   She has history of MGUS with negative BX.   she has a diagnosis of anxiety and is currently on xanax 0.25-0.5 mg TID PRN, reports symptoms are well controlled on current regimen. She reports she takes 1/2 tab as needed, needs sporadically, typically once a week or so.    she has a diagnosis of GERD with esophageal stricture which is currently managed by omeprazole 40 mg daily, carafate PRN,     she reports symptoms is currently well controlled, and denies breakthrough reflux, burning in chest, hoarseness or cough.  She does had some dysphagia, sensation of pills or steak getting "stuck"   She is currently on alendronate for osteoporosis, managed by Dr. Estanislado Pandy, discussing transition to prolia  BMI is Body mass index is 30.34 kg/m., she has been working on diet and  exercise as tolerated with pain, will walk a  few days a week when she is feeling up to it.  Wt Readings from Last 3 Encounters:  12/13/18 174 lb (78.9 kg)  09/27/18 173 lb 6.4 oz (78.7 kg)  09/11/18 172 lb 9.6 oz (78.3 kg)   She also has history of PTCA/stent placed in 2003 and low risk myoview in 2014.  Her blood pressure has been controlled at home, today their BP is BP: 136/82  She does workout. She denies chest pain, shortness of breath, dizziness.   She is not on cholesterol medication,  Her cholesterol is at goal. The cholesterol last visit was:   Lab Results  Component Value Date   CHOL 182 09/11/2018   HDL 65 09/11/2018   LDLCALC 94 09/11/2018   TRIG 133 09/11/2018   CHOLHDL 2.8 09/11/2018    She has been working on diet and exercise for prediabetes (since 2010), and denies increased appetite, nausea, paresthesia of the feet, polydipsia, polyuria and visual disturbances. Last A1C in the office was:  Lab Results  Component Value Date   HGBA1C 5.8 (H) 09/11/2018   She has CKD III which we are monitoring closely:  Lab Results  Component Value Date   GFRNONAA 54 (L) 09/11/2018   Patient is on Vitamin D supplement.   Lab Results  Component Value Date   VD25OH 56 09/11/2018        Current Medications:  Current Outpatient Medications on File Prior to Visit  Medication Sig  . acetaminophen (TYLENOL) 500 MG tablet Take 1 tablet (500 mg total) by mouth every 6 (six) hours as needed. (Patient taking differently: Take 500-1,000 mg by mouth every 6 (six) hours as needed (pain). )  . albuterol (PROAIR HFA) 108 (90 Base) MCG/ACT inhaler Inhale 2 puffs into the lungs every 6 (six) hours as needed for wheezing or shortness of breath.  [name] alendronate (FOSAMAX) 70 MG tablet TAKE 1 TABLET BY MOUTH ONCE A WEEK. TAKE WITH A FULL GLASS OF WATER ON AN EMPTY STOMACH.  [name] ALPRAZolam (XANAX) 0.5 MG tablet Take 1/2 tablet 1- 2 x /day ONLY if needed for Anxiety Attack &  limit to 5 days /week to avoid addiction (Last till June 23rd)  .  amLODipine (NORVASC) 10 MG tablet Take 1 tablet Daily for BP &  Heart  . aspirin EC 81 MG tablet Take 81 mg by mouth at bedtime.   . Cholecalciferol (VITAMIN D3) 5000 units CAPS Take 5,000 Units by mouth 2 (two) times daily.   [name] Coral Calcium 1000 (390 Ca) MG TABS Take 1,000 mg by mouth 2 (two) times daily.  . diazepam (VALIUM) 5 MG tablet TAKE ONE TAB ONE HOUR PRIOR TO MRI REPEAT AS NEEDED #2 . ZERO REFILLS  . diclofenac sodium (VOLTAREN) 1 % GEL Apply 2 g topically 4 (four) times daily. (Patient taking differently: Apply 2 g topically 4 (four) times daily as needed (pain). )  . gabapentin (NEURONTIN) 400 MG capsule Take 400 mg by mouth 3 (three) times daily.  [name] ipratropium-albuterol (DUONEB) 0.5-2.5 (3) MG/3ML SOLN Take 3 mLs by nebulization every 6 (six) hours as needed (shortness of breath/wheezing).   [name] ketoconazole (NIZORAL) 2 % cream  APPLY ONE APPLICATION TOPICALLY TWO TIMES DAILY (Patient taking differently: Apply 1 application topically 2 (two) times daily as needed for irritation. )  . Magnesium 400 MG TABS Take 400 mg by mouth daily.  . meclizine (ANTIVERT) 25 MG tablet Take 1/2 to 1 tablet 2  to 3 x /day ONLY if needed  For Dizziness / Vertigo  . metoprolol tartrate (LOPRESSOR) 50 MG tablet Take 25 mg by mouth at bedtime.   . nitroGLYCERIN (NITROSTAT) 0.4 MG SL tablet Place 1 tablet (0.4 mg total) under the tongue every 5 (five) minutes as needed for chest pain.  [name] omeprazole (PRILOSEC) 40 MG capsule Take 1 capsule 2 x /day  For  Heartburn, Reflux & Nausea  . ondansetron (ZOFRAN) 4 MG tablet Take 1 tablet (4 mg total) by mouth every 8 (eight) hours as needed for nausea or vomiting.  . polyethylene glycol powder (GLYCOLAX/MIRALAX) powder Take 17 g by mouth 2 (two) times daily. (Patient taking differently: Take 17 g by mouth daily. )  . promethazine (PHENERGAN) 25 MG tablet TAKE 1 TABLET BY MOUTH EVERY 6 HOURS AS NEEDED FOR NAUSEA AND VOMITING  . senna-docusate (SENOKOT-S) 8.6-50 MG tablet  Take 2 tablets by mouth 2 (two) times daily. (Patient taking differently: Take 1 tablet by mouth 2 (two) times daily. )  . sucralfate (CARAFATE) 1 g tablet Take 1 tablet 2 to 4 x /day before meals for Heartburn (Patient taking differently: Take 1 g by mouth 3 (three) times daily before meals. )  . tamsulosin (FLOMAX) 0.4 MG CAPS capsule Take 0.4 mg by mouth daily.  . fluticasone furoate-vilanterol (BREO ELLIPTA) 100-25 MCG/INH AEPB Inhale 1 puff into the lungs daily. Rinse mouth with water after each use   No current facility-administered medications on file prior to visit.      Allergies:  Allergies  Allergen Reactions  . Ace Inhibitors Cough    Per Dr Melvyn Novas pulmonology 2013  . Aspartame Diarrhea and Nausea And Vomiting  . Atorvastatin Other (See Comments)    Muscle aches  . Biaxin [Clarithromycin] Other (See Comments)    Unknown reaction : PATIENT CAN TOLERATE Z-PAK.  Is written on patient's paper chart.  [name] [Roflumilast] Nausea And Vomiting  . Erythromycin Other (See Comments)    Unknown reaction: PATIENT CAN TOLERATE Z-PAK.  It is written on patient's paper chart.  . Levofloxacin Nausea And Vomiting     Medical History:  Past Medical History:  Diagnosis Date  . Anxiety    takes Xanax daily as needed  . Asthma    Albuterol daily as needed  . Blood transfusion without reported diagnosis   . CAD (coronary artery disease)    LHC 2003 with 40% pLAD, 30% mLAD, 100% D1 (moderate vessel), 100%  D2 (small vessel), 95% mid small OM2.  Pt had PTCA to D1;  D2 and OM2 small vessels and tx medically;  Last Myoview (2012): anterior infarct seen with scar, no ischemia. EF normal. Med Rx // Myoview (12/14):  Low risk; ant scar w/ peri-infarct ischemia; EF 55% with ant AK // Myoview 5/16: EF 55-65, ant, apical scar, no ischemia   . Cataract   . Chronic back pain    HNP, DDD, spinal stenosis, vertebral compression fractures.   . Chronic nausea    takes Zofran daily as needed.  normal  gastric emptying study 03/2013  . CKD (chronic kidney disease), stage III ([place]) 09/27/2017  . Constipation    felt to be functional. takes Senokot and Miralax daily.  treated with Linzess in past.   . DIVERTICULOSIS, COLON 08/28/2008   Qualifier: Diagnosis of  By: [name], [name], [name]    . Elevated LFTs 2015   probably med induced DILI, from Augmentin vs Pravastatin  . GERD (gastroesophageal reflux disease)  hx of esophageal stricture   . Hiatal hernia    Paraesophageal hernias. s/p fundoplications in 1610 and 04/2013  . History of bronchitis several of yrs ago  . History of colon polyps 03/2009, 03/2011   adenomatous colon polyps, no high grade dysplasia.   [name] History of vertigo    no meds  . Hyperlipidemia    takes Pravastatin daily  . Hypertension    takes Amlodipine,Metoprolol,and Losartan daily  . Nausea 03/2016  . Osteoporosis   . PONV (postoperative nausea and vomiting)    yrs ago  . Slow urinary stream    takes Rapaflo daily  . TIA (transient ischemic attack) 1995   no residual effects noted   Family history- Reviewed and unchanged Social history- Reviewed and unchanged   Review of Systems:  Review of Systems  Constitutional: Negative for malaise/fatigue and weight loss.  HENT: Negative for hearing loss and tinnitus.   Eyes: Negative for blurred vision and double vision.  Respiratory: Negative for cough, shortness of breath and wheezing.   Cardiovascular: Negative for chest pain, palpitations, orthopnea, claudication and leg swelling.  Gastrointestinal: Negative for abdominal pain, blood in stool, constipation, diarrhea, heartburn, melena, nausea and vomiting.  Genitourinary: Negative.   Musculoskeletal: Positive for back pain and joint pain. Negative for falls and myalgias.  Skin: Negative for rash.  Neurological: Negative for dizziness, tingling, sensory change, weakness and headaches.  Endo/Heme/Allergies: Negative for polydipsia.  Psychiatric/Behavioral:  Negative for depression, memory loss, substance abuse and suicidal ideas. The patient is not nervous/anxious.   All other systems reviewed and are negative.    Physical Exam: BP 136/82   Pulse 70   Temp (!) 97.5 F (36.4 C)   Ht 5' 3.5" (1.613 m)   Wt 174 lb (78.9 kg)   SpO2 98%   BMI 30.34 kg/m  Wt Readings from Last 3 Encounters:  12/13/18 174 lb (78.9 kg)  09/27/18 173 lb 6.4 oz (78.7 kg)  09/11/18 172 lb 9.6 oz (78.3 kg)   General Appearance: Well nourished, in no apparent distress. Eyes: PERRLA, EOMs, conjunctiva no swelling or erythema Sinuses: No Frontal/maxillary tenderness ENT/Mouth: Ext aud canals clear, TMs without erythema, bulging. No erythema, swelling, or exudate on post pharynx.  Tonsils not swollen or erythematous. Hearing normal.  Neck: Supple, thyroid normal.  Respiratory: Respiratory effort normal, BS equal bilaterally without rales, rhonchi, wheezing or stridor.  Cardio: RRR with no MRGs. Brisk peripheral pulses without edema.  Abdomen: Soft, + BS.  Non tender, no guarding, rebound, hernias, masses. Lymphatics: Non tender without lymphadenopathy.  Musculoskeletal: Full ROM, 5/5 strength, Normal gait Skin: Warm, dry without rashes, lesions, ecchymosis; small superficial scabbed area to left auricle; per patient persistent/recurrent x several months.  Neuro: Cranial nerves intact. No cerebellar symptoms.  Psych: Awake and oriented X 3, normal affect, Insight and Judgment appropriate.    Izora Ribas, NP 11:21 AM [facility]

## 2018-12-13 ENCOUNTER — Ambulatory Visit (INDEPENDENT_AMBULATORY_CARE_PROVIDER_SITE_OTHER): Payer: PPO | Admitting: Adult Health

## 2018-12-13 ENCOUNTER — Other Ambulatory Visit: Payer: Self-pay

## 2018-12-13 ENCOUNTER — Encounter: Payer: Self-pay | Admitting: Adult Health

## 2018-12-13 VITALS — BP 136/82 | HR 70 | Temp 97.5°F | Ht 63.5 in | Wt 174.0 lb

## 2018-12-13 DIAGNOSIS — G894 Chronic pain syndrome: Secondary | ICD-10-CM

## 2018-12-13 DIAGNOSIS — I2510 Atherosclerotic heart disease of native coronary artery without angina pectoris: Secondary | ICD-10-CM

## 2018-12-13 DIAGNOSIS — I10 Essential (primary) hypertension: Secondary | ICD-10-CM | POA: Diagnosis not present

## 2018-12-13 DIAGNOSIS — N183 Chronic kidney disease, stage 3 unspecified: Secondary | ICD-10-CM

## 2018-12-13 DIAGNOSIS — E782 Mixed hyperlipidemia: Secondary | ICD-10-CM

## 2018-12-13 DIAGNOSIS — E559 Vitamin D deficiency, unspecified: Secondary | ICD-10-CM | POA: Diagnosis not present

## 2018-12-13 DIAGNOSIS — Z79899 Other long term (current) drug therapy: Secondary | ICD-10-CM

## 2018-12-13 DIAGNOSIS — R7309 Other abnormal glucose: Secondary | ICD-10-CM | POA: Diagnosis not present

## 2018-12-13 DIAGNOSIS — K219 Gastro-esophageal reflux disease without esophagitis: Secondary | ICD-10-CM

## 2018-12-13 DIAGNOSIS — D472 Monoclonal gammopathy: Secondary | ICD-10-CM

## 2018-12-13 DIAGNOSIS — J42 Unspecified chronic bronchitis: Secondary | ICD-10-CM | POA: Diagnosis not present

## 2018-12-13 DIAGNOSIS — R7303 Prediabetes: Secondary | ICD-10-CM | POA: Diagnosis not present

## 2018-12-13 DIAGNOSIS — I2583 Coronary atherosclerosis due to lipid rich plaque: Secondary | ICD-10-CM

## 2018-12-13 LAB — COMPLETE METABOLIC PANEL WITH GFR
AG Ratio: 1.4 (calc) (ref 1.0–2.5)
ALT: 20 U/L (ref 6–29)
AST: 18 U/L (ref 10–35)
Albumin: 4.6 g/dL (ref 3.6–5.1)
Alkaline phosphatase (APISO): 73 U/L (ref 37–153)
BUN: 15 mg/dL (ref 7–25)
CO2: 29 mmol/L (ref 20–32)
Calcium: 10.8 mg/dL — ABNORMAL HIGH (ref 8.6–10.4)
Chloride: 101 mmol/L (ref 98–110)
Creat: 0.84 mg/dL (ref 0.60–0.88)
GFR, Est African American: 76 mL/min/{1.73_m2} (ref >=60)
GFR, Est Non African American: 65 mL/min/{1.73_m2} (ref >=60)
Globulin: 3.2 g/dL (calc) (ref 1.9–3.7)
Glucose, Bld: 95 mg/dL (ref 65–99)
Potassium: 4.6 mmol/L (ref 3.5–5.3)
Sodium: 140 mmol/L (ref 135–146)
Total Bilirubin: 0.6 mg/dL (ref 0.2–1.2)
Total Protein: 7.8 g/dL (ref 6.1–8.1)

## 2018-12-13 LAB — CBC WITH DIFFERENTIAL/PLATELET
Absolute Monocytes: 688 cells/uL (ref 200–950)
Basophils Absolute: 52 cells/uL (ref 0–200)
Basophils Relative: 0.6 %
Eosinophils Absolute: 69 cells/uL (ref 15–500)
Eosinophils Relative: 0.8 %
HCT: 46.7 % — ABNORMAL HIGH (ref 35.0–45.0)
Hemoglobin: 15.8 g/dL — ABNORMAL HIGH (ref 11.7–15.5)
Lymphs Abs: 1677 cells/uL (ref 850–3900)
MCH: 31.7 pg (ref 27.0–33.0)
MCHC: 33.8 g/dL (ref 32.0–36.0)
MCV: 93.6 fL (ref 80.0–100.0)
MPV: 10.8 fL (ref 7.5–12.5)
Monocytes Relative: 8 %
Neutro Abs: 6115 cells/uL (ref 1500–7800)
Neutrophils Relative %: 71.1 %
Platelets: 359 10*3/uL (ref 140–400)
RBC: 4.99 10*6/uL (ref 3.80–5.10)
RDW: 13.4 % (ref 11.0–15.0)
Total Lymphocyte: 19.5 %
WBC: 8.6 10*3/uL (ref 3.8–10.8)

## 2018-12-13 LAB — TSH: TSH: 0.69 mIU/L (ref 0.40–4.50)

## 2018-12-13 LAB — LIPID PANEL
Cholesterol: 174 mg/dL (ref <200)
HDL: 72 mg/dL (ref >=50)
LDL Cholesterol (Calc): 82 mg/dL (calc)
Non-HDL Cholesterol (Calc): 102 mg/dL (calc) (ref <130)
Total CHOL/HDL Ratio: 2.4 (calc) (ref <5.0)
Triglycerides: 104 mg/dL (ref <150)

## 2018-12-13 LAB — MAGNESIUM: Magnesium: 2.1 mg/dL (ref 1.5–2.5)

## 2018-12-13 MED ORDER — OMEPRAZOLE 40 MG PO CPDR: DELAYED_RELEASE_CAPSULE | ORAL | 1 refills | Status: DC

## 2018-12-13 NOTE — Patient Instructions (Addendum)
Goals    . DIET - INCREASE WATER INTAKE     4-5 bottles daily     . Exercise 150 min/wk Moderate Activity    . LDL CALC < 100       Please discuss alendronate with Dr. Estanislado Pandy - risks with damage to esophagus due to history of esophageal stricture, ? Do better with injectable agent such as prolia   Biotin, zinc, vitamin D - think about getting on a hair/skin/nails supplement   Please discuss recurrent bleeding area on ear with dermatologist      Esophageal Stricture  Esophageal stricture is a narrowing (stricture) of the esophagus. The esophagus is the part of the body that moves food and liquid from your mouth to your stomach. The esophagus can become narrow because of disease or damage to the area. This condition can make swallowing difficult, painful, or even impossible. It also makes choking more likely. What are the causes? The most common cause of this condition is gastroesophageal reflux disease (GERD). Normally, food travels down the esophagus and stays in the stomach to be digested. In GERD, food and stomach acid move back up into the esophagus. Over time, this causes scar tissue and leads to narrowing. Other causes of esophageal stricture include:  Scarring from swallowing (ingesting) a harmful substance.  Damage from medical instruments used in the esophagus.  Radiation therapy.  Cancer.  Inflammation of the esophagus. What increases the risk? You are more likely to develop an esophageal stricture if you have GERD or esophageal cancer. What are the signs or symptoms? Symptoms of this condition include:  Difficulty swallowing.  Pain when swallowing.  Burning pain or discomfort in the throat or chest (heartburn).  Vomiting or spitting up (regurgitating) food or liquids.  Unexplained weight loss. How is this diagnosed? This condition may be diagnosed based on:  Your symptoms and a physical exam.  Tests, such as: ? Upper endoscopy. Your health care  provider will insert a flexible tube with a tiny camera on it (endoscope) into your esophagus to check for a stricture. A tissue sample (biopsy) may also be taken to be examined under a microscope. ? Esophageal pH monitoring. This test involves using a tube to collect acid in the esophagus to determine how much stomach acid is entering the esophagus. ? Barium swallow test. For this test, you will drink a chalky liquid (barium solution) that coats the lining of the esophagus. Then you will have an X-ray taken. The barium solution helps to show if there is a stricture. How is this treated? Treatment for esophageal stricture depends on what is causing your condition and how severe your condition is. Treatment options include:  Esophageal dilation. In this procedure, a health care provider inserts an endoscope or a tool called a dilator into the esophagus to gently stretch it and make the opening wider.  Stents. In some cases, a health care provider may place a small device (stent) in the esophagus to keep it open.  Acid-blocking medicines. Taking these can help you manage GERD symptoms after an esophageal stricture. Controlling your GERD symptoms or being free of them can prevent the stricture from returning. Follow these instructions at home: Eating and drinking      Follow instructions from your health care provider about any diet changes.  Cut your food into small pieces, chew well, and eat slowly  Try to eat soft food that is easier to swallow.  Eat and drink only when you are sitting upright.  Do not drink alcohol. If you need help quitting, ask your health care provider.  Do not eat during the 3 hours before bedtime.  Do not overeat at meals.  Do not eat foods that can make reflux worse. These include: ? Fatty foods, such as red meat and processed foods. ? Spicy foods. ? Soda. ? Tomato products. ? Chocolate. General instructions  Take over-the-counter and prescription  medicines only as told by your health care provider.  Do not use any products that contain nicotine or tobacco, such as cigarettes and e-cigarettes. If you need help quitting, ask your health care provider.  Lose weight if you are overweight.  Wear loose, comfortable clothing.  When lying in bed, raise (elevate) your head with pillows. This will help to prevent your stomach contents from backing up into your esophagus while you sleep.  Keep all follow-up visits as told by your health care provider. This is important. Contact a health care provider if:  You have problems eating or swallowing.  You regurgitate food and liquid.  Your symptoms do not improve with treatment. Get help right away if:  You can no longer keep down any food, drinks, or your saliva. Summary  Esophageal stricture is a narrowing of the part of the body that moves food and liquid from your mouth to your stomach (esophagus).  The esophagus can become narrow because of disease or damage to the area. This can make swallowing difficult, painful, or even impossible.  Treatment for esophageal stricture depends on what is causing your condition and how severe your condition is. In some cases, procedures may be done to make the opening of the esophagus wider or to place a stent in the esophagus to keep it open.  Do not drink alcohol, overeat at meals, or eat foods that can make reflux worse. This information is not intended to replace advice given to you by your health care provider. Make sure you discuss any questions you have with your health care provider. Document Released: 01/03/2006 Document Revised: 07/21/2017 Document Reviewed: 12/30/2016 Elsevier Patient Education  2020 [employer].

## 2018-12-14 ENCOUNTER — Other Ambulatory Visit: Payer: Self-pay | Admitting: Adult Health

## 2018-12-14 MED ORDER — CORAL CALCIUM 1000 (390 CA) MG PO TABS: 1000.0000 mg | ORAL_TABLET | Freq: Every day | ORAL | Status: DC

## 2018-12-23 ENCOUNTER — Other Ambulatory Visit: Payer: Self-pay | Admitting: Physician Assistant

## 2018-12-29 ENCOUNTER — Other Ambulatory Visit: Payer: Self-pay

## 2018-12-29 ENCOUNTER — Ambulatory Visit
Admission: RE | Admit: 2018-12-29 | Discharge: 2018-12-29 | Disposition: A | Discharge status: Home / Self Care | Payer: PPO | Source: Ambulatory Visit | Attending: Orthopaedic Surgery | Admitting: Orthopaedic Surgery

## 2018-12-29 DIAGNOSIS — M25551 Pain in right hip: Secondary | ICD-10-CM | POA: Diagnosis not present

## 2019-01-02 ENCOUNTER — Ambulatory Visit (INDEPENDENT_AMBULATORY_CARE_PROVIDER_SITE_OTHER): Payer: PPO | Admitting: Orthopaedic Surgery

## 2019-01-02 ENCOUNTER — Encounter: Payer: Self-pay | Admitting: Orthopaedic Surgery

## 2019-01-02 DIAGNOSIS — M87051 Idiopathic aseptic necrosis of right femur: Secondary | ICD-10-CM

## 2019-01-02 NOTE — Progress Notes (Signed)
HPI: [name] [name] returns today developed MRI of her right hip.  She continues to have significant right hip pain is now having pain down to the knee.  She is admitting injury.  She is ambulating with a cane.  Patient's medical history pertinent for chronic kidney disease stage III coronary artery disease and history of stent placement.,  Asthma gastric reflux disease history of esophageal stricture, and TIA with no residual effects. MRI right hip is reviewed with the patient and shows a insufficiency fracture involving the right superior acetabulum with severe marrow edema.  Right hip partial cartilage loss involving femoral head and acetabulum.  AVN of the femoral head without articular surface collapse.  Images reviewed with the patient and her husband is present today.   Review of systems: No fevers chills shortness of breath chest pain.  Physical exam: General well-developed well-nourished female no acute distress. Right hip pain with internal rotation.  Ambulates with a slight antalgic gait with the use a cane in her left hand.  Impression: Right hip avascular necrosis without articular collapse. Insufficiency fracture right superior acetabulum  Plan: Discussed with patient that she can continue to ambulate on the leg and did really do nothing about it other than offload it with a cane if her pain is not severe.  However if she wanted to proceed with surgery surgery recommended would be a right total hip arthroplasty.  She would like cardiac clearance prior to surgery.  Questions encouraged and answered at length.  She will think about this and let the office know if she like to proceed with surgery.

## 2019-01-05 NOTE — Progress Notes (Signed)
Cardiology Office Note:    Date:  01/07/2019   ID:  [name] [name], DOB June 30, 1935, MRN [id number]  PCP:  [name] Pinto, MD  Cardiologist:  [name] Furbish, MD  Electrophysiologist:  None   Referring MD: [name] Pinto, MD   Here for preoperative clearance.  History of Present Illness:    [name] [name] is a 83 y.o. female with CAD post diagonal PTCA in 2003.   She occasionally will have some wheezing for which she takes an inhaler.  She does not describe any escalating anginal symptoms.  Eager to have hip surgery performed.  Her husband has delayed his hip surgery with Dr. Ninfa [name].  He has seen me in the past.    Doing well, no angina, no SOB, no syncope, no bleeding. Has experienced memory issues.   Past Medical History:  Diagnosis Date   Anxiety    takes Xanax daily as needed   Asthma    Albuterol daily as needed   Blood transfusion without reported diagnosis    CAD (coronary artery disease)    LHC 2003 with 40% pLAD, 30% mLAD, 100% D1 (moderate vessel), 100%  D2 (small vessel), 95% mid small OM2.  Pt had PTCA to D1;  D2 and OM2 small vessels and tx medically;  Last Myoview (2012): anterior infarct seen with scar, no ischemia. EF normal. Med Rx // Myoview (12/14):  Low risk; ant scar w/ peri-infarct ischemia; EF 55% with ant AK // Myoview 5/16: EF 55-65, ant, apical scar, no ischemia    Cataract    Chronic back pain    HNP, DDD, spinal stenosis, vertebral compression fractures.    Chronic nausea    takes Zofran daily as needed.  normal gastric emptying study 03/2013   CKD (chronic kidney disease), stage III ([place]) 09/27/2017   Constipation    felt to be functional. takes Senokot and Miralax daily.  treated with Linzess in past.    DIVERTICULOSIS, COLON 08/28/2008   Qualifier: Diagnosis of  By: [name], [name], [name]     Elevated LFTs 2015   probably med induced DILI, from Augmentin vs Pravastatin   GERD (gastroesophageal reflux disease)    hx of esophageal  stricture    Hiatal hernia    Paraesophageal hernias. s/p fundoplications in 0000000 and 04/2013   History of bronchitis several of yrs ago   History of colon polyps 03/2009, 03/2011   adenomatous colon polyps, no high grade dysplasia.    History of vertigo    no meds   Hyperlipidemia    takes Pravastatin daily   Hypertension    takes Amlodipine,Metoprolol,and Losartan daily   Nausea 03/2016   Osteoporosis    PONV (postoperative nausea and vomiting)    yrs ago   Slow urinary stream    takes Rapaflo daily   TIA (transient ischemic attack) 1995   no residual effects noted    Past Surgical History:  Procedure Laterality Date   APPENDECTOMY     patient unsure of date   [place]     Patient unsure of date.   CORONARY ANGIOPLASTY  11/16/2001   1 stent   ESOPHAGEAL MANOMETRY N/A 03/25/2013   Procedure: ESOPHAGEAL MANOMETRY (EM);  Surgeon: [name] Castle, MD;  Location: [facility];  Service: Endoscopy;  Laterality: N/A;   ESOPHAGOGASTRODUODENOSCOPY N/A 03/15/2013   Procedure: ESOPHAGOGASTRODUODENOSCOPY (EGD);  Surgeon: [name] Bears, MD;  Location: [facility] ENDOSCOPY;  Service: Endoscopy;  Laterality:  N/A;   ESOPHAGOGASTRODUODENOSCOPY N/A 12/25/2015   Procedure: ESOPHAGOGASTRODUODENOSCOPY (EGD);  Surgeon: [name] Stabler, MD;  Location: [facility] ENDOSCOPY;  Service: Endoscopy;  Laterality: N/A;   EYE SURGERY  11/2000   bilateral cataracts with lens implant   FIXATION KYPHOPLASTY LUMBAR SPINE  X 2   "L3-4"   HEMORRHOID SURGERY     HIATAL HERNIA REPAIR  06/03/2011   Procedure: LAPAROSCOPIC REPAIR OF HIATAL HERNIA;  Surgeon: [name] Hector, MD;  Location: [facility] ORS;  Service: General;  Laterality: N/A;   HIATAL HERNIA REPAIR N/A 04/24/2013   Procedure: LAPAROSCOPIC  REPAIR RECURRENT PARASOPHAGEAL HIATAL HERNIA WITH FUNDOPLICATION;  Surgeon: [name] Hector, MD;  Location: [facility];  Service: General;  Laterality: N/A;   INSERTION  OF MESH N/A 04/24/2013   Procedure: INSERTION OF MESH;  Surgeon: [name] Hector, MD;  Location: [facility];  Service: General;  Laterality: N/A;   IR KYPHO THORACIC WITH BONE BIOPSY  09/29/2017   IR KYPHO THORACIC WITH BONE BIOPSY  11/03/2017   LAPAROSCOPIC LYSIS OF ADHESIONS N/A 04/24/2013   Procedure: LAPAROSCOPIC LYSIS OF ADHESIONS;  Surgeon: [name] Hector, MD;  Location: [facility];  Service: General;  Laterality: N/A;   LAPAROSCOPIC NISSEN FUNDOPLICATION  [id number]   Procedure: LAPAROSCOPIC NISSEN FUNDOPLICATION;  Surgeon: [name] Hector, MD;  Location: [facility];  Service: General;  Laterality: N/A;   LUMBAR FUSION  12/07/2015   Right L2-3 facetectomy, posterolateral fusion L2-3 with pedicle screws, rods, local bone graft, Vivigen cancellous chips. Fusion extended to the L1 level with pedicle screws and rods   LUMBAR LAMINECTOMY/DECOMPRESSION MICRODISCECTOMY Right 06/26/2012   Procedure: LUMBAR LAMINECTOMY/DECOMPRESSION MICRODISCECTOMY;  Surgeon: [name] Oto, MD;  Location: [place];  Service: Orthopedics;  Laterality: Right;  RIGHT L4-5 MICRODISCECTOMY   LUMBAR LAMINECTOMY/DECOMPRESSION MICRODISCECTOMY N/A 05/29/2015   Procedure: RIGHT L2-3 MICRODISCECTOMY;  Surgeon: [name] Oto, MD;  Location: [place];  Service: Orthopedics;  Laterality: N/A;   TOTAL ABDOMINAL HYSTERECTOMY  1975   partial   UPPER GASTROINTESTINAL ENDOSCOPY      Current Medications: Current Meds  Medication Sig   acetaminophen (TYLENOL) 500 MG tablet Take 1 tablet (500 mg total) by mouth every 6 (six) hours as needed.   alendronate (FOSAMAX) 70 MG tablet TAKE 1 TABLET BY MOUTH ONCE A WEEK. TAKE WITH A FULL GLASS OF WATER ON AN EMPTY STOMACH.   ALPRAZolam (XANAX) 0.5 MG tablet Take 1/2 tablet 1- 2 x /day ONLY if needed for Anxiety Attack &  limit to 5 days /week to avoid addiction (Last till June 23rd)   amLODipine (NORVASC) 10 MG tablet Take 1 tablet Daily for BP &  Heart   aspirin EC 81 MG tablet Take 81 mg by  mouth at bedtime.    Cholecalciferol (VITAMIN D3) 5000 units CAPS Take 5,000 Units by mouth 2 (two) times daily.    Coral Calcium 1000 (390 Ca) MG TABS Take 1,000 mg by mouth daily.   diclofenac sodium (VOLTAREN) 1 % GEL Apply 2 g topically 4 (four) times daily.   fluticasone furoate-vilanterol (BREO ELLIPTA) 100-25 MCG/INH AEPB Inhale 1 puff into the lungs daily. Rinse mouth with water after each use   gabapentin (NEURONTIN) 400 MG capsule Take 400 mg by mouth 3 (three) times daily.   ipratropium-albuterol (DUONEB) 0.5-2.5 (3) MG/3ML SOLN Take 3 mLs by nebulization every 6 (six) hours as needed (shortness of breath/wheezing).    ketoconazole (NIZORAL) 2 % cream  APPLY ONE APPLICATION TOPICALLY TWO TIMES DAILY  Magnesium 400 MG TABS Take 400 mg by mouth daily.   meclizine (ANTIVERT) 25 MG tablet Take 1/2 to 1 tablet 2 to 3 x /day ONLY if needed  For Dizziness / Vertigo   metoprolol tartrate (LOPRESSOR) 50 MG tablet Take 1 tablet 2 x /day for BP & Heart   nitroGLYCERIN (NITROSTAT) 0.4 MG SL tablet Place 1 tablet (0.4 mg total) under the tongue every 5 (five) minutes as needed for chest pain.   omeprazole (PRILOSEC) 40 MG capsule Take 1 capsule daily  For  Heartburn, Reflux & Nausea   ondansetron (ZOFRAN) 4 MG tablet Take 1 tablet (4 mg total) by mouth every 8 (eight) hours as needed for nausea or vomiting.   polyethylene glycol powder (GLYCOLAX/MIRALAX) powder Take 17 g by mouth 2 (two) times daily.   pravastatin (PRAVACHOL) 40 MG tablet Take 40 mg by mouth daily.   promethazine (PHENERGAN) 25 MG tablet TAKE 1 TABLET BY MOUTH EVERY 6 HOURS AS NEEDED FOR NAUSEA AND VOMITING   senna-docusate (SENOKOT-S) 8.6-50 MG tablet Take 2 tablets by mouth 2 (two) times daily. (Patient taking differently: Take 1 tablet by mouth 2 (two) times daily. )   sucralfate (CARAFATE) 1 g tablet Take 1 tablet 2 to 4 x /day before meals for Heartburn (Patient taking differently: Take 1 g by mouth 3 (three)  times daily before meals. )   tamsulosin (FLOMAX) 0.4 MG CAPS capsule Take 0.4 mg by mouth daily.     Allergies:   Ace inhibitors, Aspartame, Atorvastatin, Biaxin [clarithromycin], Daliresp [roflumilast], Erythromycin, and Levofloxacin   Social History   Socioeconomic History   Marital status: Married    Spouse name: [name]   Number of children: 2   Years of education: 12   Highest education level: Not on file  Occupational History   Occupation: Retired    Fish farm manager: RETIRED    Comment: worked at [place]: Not on file   Food insecurity    Worry: Not on file    Inability: Not on Lexicographer needs    Medical: Not on file    Non-medical: Not on file  Tobacco Use   Smoking status: Never Smoker   Smokeless tobacco: Never Used  Substance and Sexual Activity   Alcohol use: No   Drug use: Never   Sexual activity: Not on file  Lifestyle   Physical activity    Days per week: Not on file    Minutes per session: Not on file   Stress: Not on file  Relationships   Social connections    Talks on phone: Not on file    Gets together: Not on file    Attends religious service: Not on file    Active member of club or organization: Not on file    Attends meetings of clubs or organizations: Not on file    Relationship status: Not on file  Other Topics Concern   Not on file  Social History Narrative   Lives with husband.   Right-handed.   3 cups caffeine per day.     Family History: The patient's family history includes Brain cancer in her brother; Colon cancer (age of onset: 63) in her mother; Heart attack (age of onset: 72) in her father; Heart disease in her brother and brother; Kidney disease in her daughter. There is no history of Esophageal cancer or Stomach cancer.  ROS:   Please see the history of present illness.  All other systems reviewed and are negative.  EKGs/Labs/Other Studies  Reviewed:    The following studies were reviewed today:  Cardiac catheterization 11/16/2001 LM normal LAD diffuse proximal-mid 40, mid to distal 30; D1 proximal 100; smaller D2 100 LCx irregularities; OM2 mid 95 (small) RCA normal   Echocardiogram 09/05/2014 EF 55-60, normal wall motion, mild BAE, atrial septal lipomatous hypertrophy, PASP 35  Nuclear stress test 09/20/2014  Defect 1: There is a medium defect present in the mid anterior and apical anterior location.  Findings consistent with prior myocardial infarction.  The left ventricular ejection fraction is normal (55-65%).  This is a low risk study.  EKG:  EKG is  ordered today.  The ekg ordered today demonstrates normal sinus rhythm 70 with Q waves noted in V2.  Likely possible lead placement.  Recent Labs: 12/13/2018: ALT 20; BUN 15; Creat 0.84; Hemoglobin 15.8; Magnesium 2.1; Platelets 359; Potassium 4.6; Sodium 140; TSH 0.69  Recent Lipid Panel    Component Value Date/Time   CHOL 174 12/13/2018 1139   TRIG 104 12/13/2018 1139   HDL 72 12/13/2018 1139   CHOLHDL 2.4 12/13/2018 1139   VLDL 20 09/30/2016 1025   LDLCALC 82 12/13/2018 1139    Physical Exam:    VS:  BP 120/70    Pulse 70    Ht 5' 3.5" (1.613 m)    Wt 173 lb (78.5 kg)    SpO2 94%    BMI 30.16 kg/m     Wt Readings from Last 3 Encounters:  01/07/19 173 lb (78.5 kg)  12/13/18 174 lb (78.9 kg)  09/27/18 173 lb 6.4 oz (78.7 kg)     GEN:  Well nourished, well developed in no acute distress HEENT: Normal NECK: No JVD; No carotid bruits LYMPHATICS: No lymphadenopathy CARDIAC: RRR, no murmurs, rubs, gallops RESPIRATORY:  Clear to auscultation without rales, mild right lower lobe wheezing  ABDOMEN: Soft, non-tender, non-distended MUSCULOSKELETAL:  No edema; No deformity  SKIN: Warm and dry NEUROLOGIC:  Alert and oriented x 3 PSYCHIATRIC:  Normal affect   ASSESSMENT:    1. Pre-operative cardiovascular examination   2. Coronary artery disease  involving native coronary artery of native heart without angina pectoris   3. Essential hypertension   4. Hyperlipidemia, unspecified hyperlipidemia type   5. Atherosclerosis of aorta (HCC)    PLAN:    In order of problems listed above:  Pre-op cardiovascular risk evaluation for right hip arthroplasty  - Dr. Jean Rosenthal.  We will draft a letter stating that she is of low to moderate cardiac risk based upon her age and prior mild CAD.  She is not having any high risk symptoms such as escalating angina, syncope, arrhythmias, heart failure.  She may proceed.  CAD  - prior angioplasty to D1, small vessel. NUC stress 4 years ago in 2016 reassuring, no ischemia.   - secondary prevention, doing well  Essential HTN  - well controlled. Meds reviewed  Aortic atherosclerosis  - secondary prevention  - statin, no changes  Excellent management by [name], NP. We may see her back on as needed basis.   Medication Adjustments/Labs and Tests Ordered: Current medicines are reviewed at length with the patient today.  Concerns regarding medicines are outlined above.  Orders Placed This Encounter  Procedures   EKG 12-Lead   No orders of the defined types were placed in this encounter.   Patient Instructions  Medication Instructions:  Your physician recommends that you continue on your current medications  as directed. Please refer to the Current Medication list given to you today.  If you need a refill on your cardiac medications before your next appointment, please call your pharmacy.   Follow-Up: As Needed         Signed, [name] Furbish, MD  01/07/2019 9:32 AM    [facility]

## 2019-01-07 ENCOUNTER — Encounter: Payer: Self-pay | Admitting: Cardiology

## 2019-01-07 ENCOUNTER — Other Ambulatory Visit: Payer: Self-pay

## 2019-01-07 ENCOUNTER — Ambulatory Visit: Payer: PPO | Admitting: Cardiology

## 2019-01-07 VITALS — BP 120/70 | HR 70 | Ht 63.5 in | Wt 173.0 lb

## 2019-01-07 DIAGNOSIS — Z01810 Encounter for preprocedural cardiovascular examination: Secondary | ICD-10-CM

## 2019-01-07 DIAGNOSIS — E785 Hyperlipidemia, unspecified: Secondary | ICD-10-CM | POA: Diagnosis not present

## 2019-01-07 DIAGNOSIS — I700 Atherosclerosis of aorta: Secondary | ICD-10-CM

## 2019-01-07 DIAGNOSIS — I2510 Atherosclerotic heart disease of native coronary artery without angina pectoris: Secondary | ICD-10-CM

## 2019-01-07 DIAGNOSIS — I10 Essential (primary) hypertension: Secondary | ICD-10-CM

## 2019-01-07 NOTE — Patient Instructions (Signed)
Medication Instructions:  Your physician recommends that you continue on your current medications as directed. Please refer to the Current Medication list given to you today.  If you need a refill on your cardiac medications before your next appointment, please call your pharmacy.   Follow-Up: As Needed

## 2019-01-10 ENCOUNTER — Other Ambulatory Visit: Payer: PPO

## 2019-01-15 ENCOUNTER — Ambulatory Visit: Payer: Self-pay | Admitting: Rheumatology

## 2019-01-21 ENCOUNTER — Ambulatory Visit: Payer: Self-pay

## 2019-01-21 ENCOUNTER — Encounter: Payer: Self-pay | Admitting: Orthopaedic Surgery

## 2019-01-21 ENCOUNTER — Ambulatory Visit: Payer: PPO

## 2019-01-21 ENCOUNTER — Ambulatory Visit (INDEPENDENT_AMBULATORY_CARE_PROVIDER_SITE_OTHER): Payer: PPO | Admitting: Orthopaedic Surgery

## 2019-01-21 DIAGNOSIS — M546 Pain in thoracic spine: Secondary | ICD-10-CM

## 2019-01-21 DIAGNOSIS — M545 Low back pain, unspecified: Secondary | ICD-10-CM

## 2019-01-21 MED ORDER — ACETAMINOPHEN-CODEINE #3 300-30 MG PO TABS: 1.0000 | ORAL_TABLET | Freq: Three times a day (TID) | ORAL | 0 refills | Status: DC | PRN

## 2019-01-21 MED ORDER — METHYLPREDNISOLONE 4 MG PO TABS: ORAL_TABLET | ORAL | 0 refills | Status: DC

## 2019-01-21 MED ORDER — TIZANIDINE HCL 4 MG PO TABS: 4.0000 mg | ORAL_TABLET | Freq: Three times a day (TID) | ORAL | 0 refills | Status: DC | PRN

## 2019-01-21 NOTE — Progress Notes (Signed)
Office Visit Note   Patient: [name] [name]           Date of Birth: 1935-12-13           MRN: [id number] Visit Date: 01/21/2019              Requested by: [name], [address] [place],  [place] [address] PCP: [name], MD   Assessment & Plan: Visit Diagnoses:  1. Pain in thoracic spine   2. Acute midline low back pain, unspecified whether sciatica present     Plan: She certainly does have an acute exacerbation of back pain and I do feel is worth trying a steroid taper combined with Zanaflex and some Tylenol 3.  She will continue intermittent ice and heat and her Neurontin.  I would like to have Dr. Louanne Skye take a look at her next week to make sure that he does not see anything that looks out of the ordinary with her spine.  All question concerns were otherwise answered addressed.  Obviously if she gets acutely worse we would recommend an MRI of her lumbar spine to rule out any new compression deformity.  Follow-Up Instructions: Return in about 1 week (around 01/28/2019).   Orders:  Orders Placed This Encounter  Procedures  . XR Thoracic Spine 2 View  . XR Lumbar Spine 2-3 Views   Meds ordered this encounter  Medications  . methylPREDNISolone (MEDROL) 4 MG tablet    Sig: Medrol dose pack. Take as instructed    Dispense:  21 tablet    Refill:  0  . tiZANidine (ZANAFLEX) 4 MG tablet    Sig: Take 1 tablet (4 mg total) by mouth every 8 (eight) hours as needed for muscle spasms.    Dispense:  30 tablet    Refill:  0  . acetaminophen-codeine (TYLENOL #3) 300-30 MG tablet    Sig: Take 1-2 tablets by mouth every 8 (eight) hours as needed for moderate pain.    Dispense:  30 tablet    Refill:  0      Procedures: No procedures performed   Clinical Data: No additional findings.   Subjective: Chief Complaint  Patient presents with  . Middle Back - Pain  The patient comes in with a one-week history of worsening mid back pain that flared up  after she was helping do some type of cleaning at home.  She states that the pain is severe and is taking her breath away although she is not short of breath.  She is someone is been in pain management in the past but he she is only on Neurontin now.  She has had spine surgery by Dr. Louanne Skye as well as multiple interventions at several levels in the lumbar spine with kyphoplasty's and vertebroplasty is secondary to compression fractures.  She feels this may be muscle related but given her history of compression fractures she certainly could have developed 1 of these given her osteoporosis.  She was originally sent to me for her right hip.  An MRI showed an insufficiency fracture of the acetabular rim and an area of partial-thickness cartilage loss.  She says that her right hip moves smoothly and she has no pain in the groin.  HPI  Review of Systems She currently denies any headache, chest pain, shortness of breath, fever, chills, nausea, vomiting  Objective: Vital Signs: There were no vitals taken for this visit.  Physical Exam She is alert and orient x3 and in  no acute distress but obvious discomfort.  She is walking slowly with a walker. Ortho Exam She does seem to exhibit pain in her upper lumbar spine and lower thoracic spine when she gets up in a seated position using her walker.  It is difficult to pinpoint where the source of her pain is but she does have pain in her back.  Examination of both lower extremities showed no gross findings in terms of weakness.  Her right hip that I was reducing for move smoothly with internal and external rotation with no pain in the groin. Specialty Comments:  No specialty comments available.  Imaging: Xr Thoracic Spine 2 View  Result Date: 01/21/2019 2 views of thoracic spine are difficult to interpret given the patient's osteoporosis and previous kyphoplasty's as well as hardware in the lower images which look like spinal instrumentation in the lumbar area.   It is difficult to tell but it does not look like there acute findings.  Xr Lumbar Spine 2-3 Views  Result Date: 01/21/2019 2 views of the lumbar spine show significant osteopenic bone.  There is been instrumentation in the back as well as multiple levels that have had compression deformities and kyphoplasty's.  No acute findings are seen, but the interpretation is certainly difficult given the nature of the patient's previous deformities.    PMFS History: Patient Active Problem List   Diagnosis Date Noted  . Nausea & vomiting 05/13/2018  . Abnormal glucose 03/01/2018  . FHx: heart disease 03/01/2018  . Therapeutic opioid-induced constipation (OIC) 12/21/2017  . Abnormal UGI series 12/21/2017  . CKD (chronic kidney disease) stage 3, GFR 30-59 ml/min (HCC) 09/27/2017  . T12 compression fracture ([place]) 09/26/2017  . Chronic bronchitis ([place]) 08/09/2017  . Atherosclerosis of aorta ([place]) 04/05/2017  . Anxiety state 06/08/2016  . Spinal stenosis, lumbar region, with neurogenic claudication 05/29/2015  . DDD (degenerative disc disease), lumbar 04/21/2015  . Hyperlipidemia, mixed 11/24/2014  . Gastroesophageal reflux disease 11/24/2014  . Depression, major, in partial remission ([place]) 09/26/2014  . Chronic pain syndrome 09/26/2014  . Coronary artery disease due to lipid rich plaque   . Osteoporosis 02/10/2014  . Prediabetes 08/14/2013  . Vitamin D deficiency 08/14/2013  . MGUS (monoclonal gammopathy of unknown significance) 03/05/2013  . Vertebral compression fracture ([place]) 06/23/2012  . ESOPHAGEAL STRICTURE 08/28/2008  . Mild intermittent asthma without complication [id number]  . Incarcerated recurrent paraesophageal hiatal hernia s/p lap redo repair [id number] 01/31/2008  . Essential hypertension 07/25/2007   Past Medical History:  Diagnosis Date  . Anxiety    takes Xanax daily as needed  . Asthma    Albuterol daily as needed  . Blood transfusion without reported diagnosis   . CAD  (coronary artery disease)    LHC 2003 with 40% pLAD, 30% mLAD, 100% D1 (moderate vessel), 100%  D2 (small vessel), 95% mid small OM2.  Pt had PTCA to D1;  D2 and OM2 small vessels and tx medically;  Last Myoview (2012): anterior infarct seen with scar, no ischemia. EF normal. Med Rx // Myoview (12/14):  Low risk; ant scar w/ peri-infarct ischemia; EF 55% with ant AK // Myoview 5/16: EF 55-65, ant, apical scar, no ischemia   . Cataract   . Chronic back pain    HNP, DDD, spinal stenosis, vertebral compression fractures.   . Chronic nausea    takes Zofran daily as needed.  normal gastric emptying study 03/2013  . CKD (chronic kidney disease), stage III ([place]) 09/27/2017  . Constipation  felt to be functional. takes Senokot and Miralax daily.  treated with Linzess in past.   . DIVERTICULOSIS, COLON 08/28/2008   Qualifier: Diagnosis of  By: [name], [name], [name]    . Elevated LFTs 2015   probably med induced DILI, from Augmentin vs Pravastatin  . GERD (gastroesophageal reflux disease)    hx of esophageal stricture   . Hiatal hernia    Paraesophageal hernias. s/p fundoplications in 0000000 and 04/2013  . History of bronchitis several of yrs ago  . History of colon polyps 03/2009, 03/2011   adenomatous colon polyps, no high grade dysplasia.   [name] History of vertigo    no meds  . Hyperlipidemia    takes Pravastatin daily  . Hypertension    takes Amlodipine,Metoprolol,and Losartan daily  . Nausea 03/2016  . Osteoporosis   . PONV (postoperative nausea and vomiting)    yrs ago  . Slow urinary stream    takes Rapaflo daily  . TIA (transient ischemic attack) 1995   no residual effects noted    Family History  Problem Relation Age of Onset  . Colon cancer Mother 57  . Heart attack Father 40       heart attack  . Brain cancer Brother        tumor   . Heart disease Brother   . Heart disease Brother   . Kidney disease Daughter   . Esophageal cancer Neg Hx   . Stomach cancer Neg Hx     Past  Surgical History:  Procedure Laterality Date  . APPENDECTOMY     patient unsure of date  . BACK SURGERY    . BLADDER REPAIR    . CHOLECYSTECTOMY     Patient unsure of date.  . CORONARY ANGIOPLASTY  11/16/2001   1 stent  . ESOPHAGEAL MANOMETRY N/A 03/25/2013   Procedure: ESOPHAGEAL MANOMETRY (EM);  Surgeon: Inda Castle, MD;  Location: [facility];  Service: Endoscopy;  Laterality: N/A;  . ESOPHAGOGASTRODUODENOSCOPY N/A 03/15/2013   Procedure: ESOPHAGOGASTRODUODENOSCOPY (EGD);  Surgeon: Jerene Bears, MD;  Location[facility] ENDOSCOPY;  Service: Endoscopy;  Laterality: N/A;  . ESOPHAGOGASTRODUODENOSCOPY N/A 12/25/2015   Procedure: ESOPHAGOGASTRODUODENOSCOPY (EGD);  Surgeon: Doran Stabler, MD;  Location: [facility] ENDOSCOPY;  Service: Endoscopy;  Laterality: N/A;  . EYE SURGERY  11/2000   bilateral cataracts with lens implant  . FIXATION KYPHOPLASTY LUMBAR SPINE  X 2   "L3-4"  . HEMORRHOID SURGERY    . HIATAL HERNIA REPAIR  06/03/2011   Procedure: LAPAROSCOPIC REPAIR OF HIATAL HERNIA;  Surgeon: Adin Hector, MD;  Location: [facility];  Service: General;  Laterality: N/A;  . HIATAL HERNIA REPAIR N/A 04/24/2013   Procedure: LAPAROSCOPIC  REPAIR RECURRENT PARASOPHAGEAL HIATAL HERNIA WITH FUNDOPLICATION;  Surgeon: Adin Hector, MD;  Location: [facility];  Service: General;  Laterality: N/A;  . INSERTION OF MESH N/A 04/24/2013   Procedure: INSERTION OF MESH;  Surgeon: Adin Hector, MD;  Location: [facility];  Service: General;  Laterality: N/A;  . IR KYPHO THORACIC WITH BONE BIOPSY  09/29/2017  . IR KYPHO THORACIC WITH BONE BIOPSY  11/03/2017  . LAPAROSCOPIC LYSIS OF ADHESIONS N/A 04/24/2013   Procedure: LAPAROSCOPIC LYSIS OF ADHESIONS;  Surgeon: Adin Hector, MD;  [facility];  Service: General;  Laterality: N/A;  . LAPAROSCOPIC NISSEN FUNDOPLICATION  [id number]   Procedure: LAPAROSCOPIC NISSEN FUNDOPLICATION;  Surgeon: Adin Hector, MD;  [facility];  Service: General;  Laterality: N/A;   . LUMBAR  FUSION  12/07/2015   Right L2-3 facetectomy, posterolateral fusion L2-3 with pedicle screws, rods, local bone graft, Vivigen cancellous chips. Fusion extended to the L1 level with pedicle screws and rods  . LUMBAR LAMINECTOMY/DECOMPRESSION MICRODISCECTOMY Right 06/26/2012   Procedure: LUMBAR LAMINECTOMY/DECOMPRESSION MICRODISCECTOMY;  Surgeon: Jessy Oto, MD;  Location: [facility];  Service: Orthopedics;  Laterality: Right;  RIGHT L4-5 MICRODISCECTOMY  . LUMBAR LAMINECTOMY/DECOMPRESSION MICRODISCECTOMY N/A 05/29/2015   Procedure: RIGHT L2-3 MICRODISCECTOMY;  Surgeon: Jessy Oto, MD;  Location: [facility];  Service: Orthopedics;  Laterality: N/A;  . TOTAL ABDOMINAL HYSTERECTOMY  1975   partial  . UPPER GASTROINTESTINAL ENDOSCOPY     Social History   Occupational History  . Occupation: Retired    Fish farm manager: RETIRED    Comment: worked at [employer]  . Smoking status: Never Smoker  . Smokeless tobacco: Never Used  Substance and Sexual Activity  . Alcohol use: No  . Drug use: Never  . Sexual activity: Not on file

## 2019-01-28 ENCOUNTER — Encounter: Payer: Self-pay | Admitting: Specialist

## 2019-01-28 ENCOUNTER — Ambulatory Visit (INDEPENDENT_AMBULATORY_CARE_PROVIDER_SITE_OTHER): Payer: PPO | Admitting: Specialist

## 2019-01-28 VITALS — BP 120/72 | HR 77 | Ht 63.0 in | Wt 173.0 lb

## 2019-01-28 DIAGNOSIS — G8911 Acute pain due to trauma: Secondary | ICD-10-CM

## 2019-01-28 DIAGNOSIS — M4326 Fusion of spine, lumbar region: Secondary | ICD-10-CM | POA: Diagnosis not present

## 2019-01-28 DIAGNOSIS — M8000XD Age-related osteoporosis with current pathological fracture, unspecified site, subsequent encounter for fracture with routine healing: Secondary | ICD-10-CM | POA: Diagnosis not present

## 2019-01-28 MED ORDER — CALCITONIN (SALMON) 200 UNIT/ACT NA SOLN: 1.0000 | Freq: Every day | NASAL | Status: DC

## 2019-01-28 MED ORDER — HYDROCODONE-ACETAMINOPHEN 7.5-325 MG PO TABS: 1.0000 | ORAL_TABLET | Freq: Four times a day (QID) | ORAL | 0 refills | Status: DC | PRN

## 2019-01-28 NOTE — Progress Notes (Signed)
Office Visit Note   Patient: [name] [name]           Date of Birth: 07-22-1935           MRN: [id number] Visit Date: 01/28/2019              Requested by: [name], [address] [place],  [place] [address] PCP: [name], MD   Assessment & Plan: Visit Diagnoses:  1. Age-related osteoporosis with current pathological fracture with routine healing, subsequent encounter   2. Acute pain due to trauma   3. Fusion of lumbar spine     Plan:Avoid frequent bending and stooping  No lifting greater than 10 lbs. May use ice or moist heat for pain. Weight loss is of benefit. Best medication for lumbar fracture is narcotics. Exercise is important to improve your indurance and does allow people to function better inspite of back pain. Wear the thoracolumbar brace for support while awaiting CT of the thoracic and lumbar spine.    Follow-Up Instructions: No follow-ups on file.   Orders:  No orders of the defined types were placed in this encounter.  No orders of the defined types were placed in this encounter.     Procedures: No procedures performed   Clinical Data: No additional findings.   Subjective: Chief Complaint  Patient presents with   Spine - Follow-up    83 year old female with history of back pain that started about 2 weeks ago after trying to help her husband sand a door. She has a catch in the back when she tries to get up from sitting position and was seen last week by Dr. Ninfa Linden and Carney Bern due to increasing upper lumbar and thoracolumbar transverse pain. She has a history of pain  That began with disc protrusion at the L4-5 level and level of spondylolisthesis about 10 years ago with hospitalization I saw her in consult. She had improvement in the leg pain then within a year began a litenny of osteoporotic fractures of the spine. She has a chronic T12 compression fracture and progressive foramenal stenosis. Underwent fusion  of the T12 to L3 level for progressive spondylosis and foramenal entrapment. She is presently taking gabapentin 400 mg 4 times daily and was prescribed tylenol #3 by Dr. Ninfa Linden and tizanidine. Had xrays of the spine then had an MRI of the lumbar spine and was seen by Dr. Ninfa Linden one week ago and visited MRI apparently not changed. She is having primarily pain with transitioning from sitting to standing and lying to sitting up and there is pain with cough or sneeze. No leg pain numbness or tingling. The legs feel weak.    Review of Systems  Constitutional: Negative.  Negative for activity change, appetite change, chills, diaphoresis, fatigue, fever and unexpected weight change.  HENT: Negative.  Negative for congestion, dental problem, drooling, ear discharge, ear pain, facial swelling, hearing loss, mouth sores, nosebleeds, postnasal drip, rhinorrhea, sinus pressure, sinus pain, sneezing, sore throat, tinnitus, trouble swallowing and voice change.   Eyes: Negative.  Negative for photophobia, pain, discharge, redness, itching and visual disturbance.  Respiratory: Positive for wheezing. Negative for apnea, cough, choking, chest tightness, shortness of breath and stridor.   Cardiovascular: Positive for leg swelling. Negative for chest pain and palpitations.  Gastrointestinal: Negative.  Negative for abdominal distention, abdominal pain, anal bleeding, blood in stool, constipation, diarrhea, nausea, rectal pain and vomiting.  Endocrine: Positive for cold intolerance. Negative for heat intolerance, polydipsia,  polyphagia and polyuria.  Genitourinary: Negative.  Negative for difficulty urinating, dyspareunia, dysuria, enuresis, hematuria and pelvic pain.  Musculoskeletal: Positive for back pain. Negative for arthralgias, gait problem, joint swelling, myalgias, neck pain and neck stiffness.  Skin: Negative.  Negative for color change, pallor, rash and wound.  Allergic/Immunologic: Negative for  environmental allergies, food allergies and immunocompromised state.  Neurological: Negative for dizziness, tremors, seizures, syncope, facial asymmetry, speech difficulty, weakness, light-headedness, numbness and headaches.  Hematological: Negative for adenopathy. Does not bruise/bleed easily.  Psychiatric/Behavioral: Negative for agitation, behavioral problems, confusion, decreased concentration, dysphoric mood, hallucinations, self-injury and sleep disturbance. The patient is not nervous/anxious and is not hyperactive.      Objective: Vital Signs: BP 120/72 (BP Location: Left Arm, Patient Position: Sitting)    Pulse 77    Ht 5\' 3"  (1.6 m)    Wt 173 lb (78.5 kg)    BMI 30.65 kg/m   Physical Exam  Ortho Exam  Specialty Comments:  No specialty comments available.  Imaging: No results found.   PMFS History: Patient Active Problem List   Diagnosis Date Noted   Nausea & vomiting 05/13/2018   Abnormal glucose 03/01/2018   FHx: heart disease 03/01/2018   Therapeutic opioid-induced constipation (OIC) 12/21/2017   Abnormal UGI series 12/21/2017   CKD (chronic kidney disease) stage 3, GFR 30-59 ml/min (HCC) 09/27/2017   T12 compression fracture ([place]) 09/26/2017   Chronic bronchitis ([place]) 08/09/2017   Atherosclerosis of aorta ([place]) 04/05/2017   Anxiety state 06/08/2016   Spinal stenosis, lumbar region, with neurogenic claudication 05/29/2015   DDD (degenerative disc disease), lumbar 04/21/2015   Hyperlipidemia, mixed 11/24/2014   Gastroesophageal reflux disease 11/24/2014   Depression, major, in partial remission ([place]) 09/26/2014   Chronic pain syndrome 09/26/2014   Coronary artery disease due to lipid rich plaque    Osteoporosis 02/10/2014   Prediabetes 08/14/2013   Vitamin D deficiency 08/14/2013   MGUS (monoclonal gammopathy of unknown significance) 03/05/2013   Vertebral compression fracture ([place]) 06/23/2012   ESOPHAGEAL STRICTURE 08/28/2008   Mild  intermittent asthma without complication [id number]   Incarcerated recurrent paraesophageal hiatal hernia s/p lap redo repair [id number] 01/31/2008   Essential hypertension 07/25/2007   Past Medical History:  Diagnosis Date   Anxiety    takes Xanax daily as needed   Asthma    Albuterol daily as needed   Blood transfusion without reported diagnosis    CAD (coronary artery disease)    LHC 2003 with 40% pLAD, 30% mLAD, 100% D1 (moderate vessel), 100%  D2 (small vessel), 95% mid small OM2.  Pt had PTCA to D1;  D2 and OM2 small vessels and tx medically;  Last Myoview (2012): anterior infarct seen with scar, no ischemia. EF normal. Med Rx // Myoview (12/14):  Low risk; ant scar w/ peri-infarct ischemia; EF 55% with ant AK // Myoview 5/16: EF 55-65, ant, apical scar, no ischemia    Cataract    Chronic back pain    HNP, DDD, spinal stenosis, vertebral compression fractures.    Chronic nausea    takes Zofran daily as needed.  normal gastric emptying study 03/2013   CKD (chronic kidney disease), stage III ([place]) 09/27/2017   Constipation    felt to be functional. takes Senokot and Miralax daily.  treated with Linzess in past.    DIVERTICULOSIS, COLON 08/28/2008   Qualifier: Diagnosis of  By: [name], [name], [name]     Elevated LFTs 2015   probably med induced DILI, from Augmentin vs Pravastatin  GERD (gastroesophageal reflux disease)    hx of esophageal stricture    Hiatal hernia    Paraesophageal hernias. s/p fundoplications in 0000000 and 04/2013   History of bronchitis several of yrs ago   History of colon polyps 03/2009, 03/2011   adenomatous colon polyps, no high grade dysplasia.    History of vertigo    no meds   Hyperlipidemia    takes Pravastatin daily   Hypertension    takes Amlodipine,Metoprolol,and Losartan daily   Nausea 03/2016   Osteoporosis    PONV (postoperative nausea and vomiting)    yrs ago   Slow urinary stream    takes Rapaflo daily   TIA  (transient ischemic attack) 1995   no residual effects noted    Family History  Problem Relation Age of Onset   Colon cancer Mother 76   Heart attack Father 56       heart attack   Brain cancer Brother        tumor    Heart disease Brother    Heart disease Brother    Kidney disease Daughter    Esophageal cancer Neg Hx    Stomach cancer Neg Hx     Past Surgical History:  Procedure Laterality Date   APPENDECTOMY     patient unsure of date   BACK SURGERY     BLADDER REPAIR     CHOLECYSTECTOMY     Patient unsure of date.   CORONARY ANGIOPLASTY  11/16/2001   1 stent   ESOPHAGEAL MANOMETRY N/A 03/25/2013   Procedure: ESOPHAGEAL MANOMETRY (EM);  Surgeon: Inda Castle, MD;  [facility];  Service: Endoscopy;  Laterality: N/A;   ESOPHAGOGASTRODUODENOSCOPY N/A 03/15/2013   Procedure: ESOPHAGOGASTRODUODENOSCOPY (EGD);  Surgeon: Jerene Bears, MD;  [facility];  Service: Endoscopy;  Laterality: N/A;   ESOPHAGOGASTRODUODENOSCOPY N/A 12/25/2015   Procedure: ESOPHAGOGASTRODUODENOSCOPY (EGD);  Surgeon: Doran Stabler, MD;  Location: [facility] ENDOSCOPY;  Service: Endoscopy;  Laterality: N/A;   EYE SURGERY  11/2000   bilateral cataracts with lens implant   FIXATION KYPHOPLASTY LUMBAR SPINE  X 2   "L3-4"   HEMORRHOID SURGERY     HIATAL HERNIA REPAIR  06/03/2011   Procedure: LAPAROSCOPIC REPAIR OF HIATAL HERNIA;  Surgeon: Adin Hector, MD;  [facility];  Service: General;  Laterality: N/A;   HIATAL HERNIA REPAIR N/A 04/24/2013   Procedure: LAPAROSCOPIC  REPAIR RECURRENT PARASOPHAGEAL HIATAL HERNIA WITH FUNDOPLICATION;  Surgeon: Adin Hector, MD;  [facility]: [facility];  Service: General;  Laterality: N/A;   INSERTION OF MESH N/A 04/24/2013   Procedure: INSERTION OF MESH;  Surgeon: Adin Hector, MD;  [facility];  Service: General;  Laterality: N/A;   IR KYPHO THORACIC WITH BONE BIOPSY  09/29/2017   IR KYPHO THORACIC WITH BONE BIOPSY   11/03/2017   LAPAROSCOPIC LYSIS OF ADHESIONS N/A 04/24/2013   Procedure: LAPAROSCOPIC LYSIS OF ADHESIONS;  Surgeon: Adin Hector, MD;  [facility];  Service: General;  Laterality: N/A;   LAPAROSCOPIC NISSEN FUNDOPLICATION  [id number]   Procedure: LAPAROSCOPIC NISSEN FUNDOPLICATION;  Surgeon: Adin Hector, MD;  [facility]: [facility];  Service: General;  Laterality: N/A;   LUMBAR FUSION  12/07/2015   Right L2-3 facetectomy, posterolateral fusion L2-3 with pedicle screws, rods, local bone graft, Vivigen cancellous chips. Fusion extended to the L1 level with pedicle screws and rods   LUMBAR LAMINECTOMY/DECOMPRESSION MICRODISCECTOMY Right 06/26/2012   Procedure: LUMBAR LAMINECTOMY/DECOMPRESSION MICRODISCECTOMY;  Surgeon: Jessy Oto,  MD;  Location: [facility];  Service: Orthopedics;  Laterality: Right;  RIGHT L4-5 MICRODISCECTOMY   LUMBAR LAMINECTOMY/DECOMPRESSION MICRODISCECTOMY N/A 05/29/2015   Procedure: RIGHT L2-3 MICRODISCECTOMY;  Surgeon: Jessy Oto, MD;  Location: [facility];  Service: Orthopedics;  Laterality: N/A;   TOTAL ABDOMINAL HYSTERECTOMY  1975   partial   UPPER GASTROINTESTINAL ENDOSCOPY     Social History   Occupational History   Occupation: Retired    Fish farm manager: RETIRED    Comment: worked at [employer]   Smoking status: Never Smoker   Smokeless tobacco: Never Used  Substance and Sexual Activity   Alcohol use: No   Drug use: Never   Sexual activity: Not on file

## 2019-01-31 ENCOUNTER — Other Ambulatory Visit: Payer: Self-pay

## 2019-01-31 ENCOUNTER — Encounter (HOSPITAL_COMMUNITY): Payer: Self-pay

## 2019-01-31 ENCOUNTER — Emergency Department (HOSPITAL_COMMUNITY)
Admission: EM | Admit: 2019-01-31 | Discharge: 2019-02-01 | Disposition: A | Discharge status: Home / Self Care | Payer: PPO | Attending: Emergency Medicine | Admitting: Emergency Medicine

## 2019-01-31 ENCOUNTER — Emergency Department (HOSPITAL_COMMUNITY): Payer: PPO

## 2019-01-31 DIAGNOSIS — Y999 Unspecified external cause status: Secondary | ICD-10-CM | POA: Insufficient documentation

## 2019-01-31 DIAGNOSIS — Y939 Activity, unspecified: Secondary | ICD-10-CM | POA: Diagnosis not present

## 2019-01-31 DIAGNOSIS — N183 Chronic kidney disease, stage 3 (moderate): Secondary | ICD-10-CM | POA: Diagnosis not present

## 2019-01-31 DIAGNOSIS — I2510 Atherosclerotic heart disease of native coronary artery without angina pectoris: Secondary | ICD-10-CM | POA: Diagnosis not present

## 2019-01-31 DIAGNOSIS — Y929 Unspecified place or not applicable: Secondary | ICD-10-CM | POA: Insufficient documentation

## 2019-01-31 DIAGNOSIS — X58XXXA Exposure to other specified factors, initial encounter: Secondary | ICD-10-CM | POA: Insufficient documentation

## 2019-01-31 DIAGNOSIS — M546 Pain in thoracic spine: Secondary | ICD-10-CM | POA: Diagnosis present

## 2019-01-31 DIAGNOSIS — S22070A Wedge compression fracture of T9-T10 vertebra, initial encounter for closed fracture: Secondary | ICD-10-CM | POA: Insufficient documentation

## 2019-01-31 DIAGNOSIS — S22080A Wedge compression fracture of T11-T12 vertebra, initial encounter for closed fracture: Secondary | ICD-10-CM | POA: Diagnosis not present

## 2019-01-31 DIAGNOSIS — I129 Hypertensive chronic kidney disease with stage 1 through stage 4 chronic kidney disease, or unspecified chronic kidney disease: Secondary | ICD-10-CM | POA: Diagnosis not present

## 2019-01-31 DIAGNOSIS — R202 Paresthesia of skin: Secondary | ICD-10-CM | POA: Insufficient documentation

## 2019-01-31 DIAGNOSIS — S32040A Wedge compression fracture of fourth lumbar vertebra, initial encounter for closed fracture: Secondary | ICD-10-CM | POA: Diagnosis not present

## 2019-01-31 MED ORDER — METHOCARBAMOL 500 MG PO TABS
500.0000 mg | ORAL_TABLET | Freq: Once | ORAL | Status: AC
  Administered 2019-01-31: 500 mg via ORAL
  Filled 2019-01-31: qty 1

## 2019-01-31 MED ORDER — PREDNISONE 20 MG PO TABS
60.0000 mg | ORAL_TABLET | Freq: Once | ORAL | Status: AC
  Administered 2019-01-31: 60 mg via ORAL
  Filled 2019-01-31: qty 3

## 2019-01-31 MED ORDER — HYDROMORPHONE HCL 1 MG/ML IJ SOLN
1.0000 mg | Freq: Once | INTRAMUSCULAR | Status: AC
  Administered 2019-01-31: 1 mg via INTRAMUSCULAR
  Filled 2019-01-31: qty 1

## 2019-01-31 MED ORDER — OXYCODONE-ACETAMINOPHEN 5-325 MG PO TABS
1.0000 | ORAL_TABLET | ORAL | Status: DC | PRN
  Administered 2019-01-31: 1 via ORAL
  Filled 2019-01-31: qty 1

## 2019-01-31 MED ORDER — ONDANSETRON 4 MG PO TBDP
4.0000 mg | ORAL_TABLET | Freq: Once | ORAL | Status: AC | PRN
  Administered 2019-01-31: 4 mg via ORAL
  Filled 2019-01-31: qty 1

## 2019-01-31 NOTE — ED Notes (Signed)
Family at bedside. 

## 2019-01-31 NOTE — ED Triage Notes (Signed)
Pt arrives POV for eval of mid to upper back pain which she reports extends across entire back. Pt report R leg pins and needles sensation, denies hx of same. Reports pins and needles sensation has been going on for a few weeks. Supposed to have a CT scan done next week but reports she cannot wait d/t pain

## 2019-02-01 MED ORDER — DIAZEPAM 5 MG PO TABS: 2.5000 mg | ORAL_TABLET | Freq: Four times a day (QID) | ORAL | 0 refills | Status: DC | PRN

## 2019-02-01 MED ORDER — PREDNISONE 20 MG PO TABS: ORAL_TABLET | ORAL | 0 refills | Status: DC

## 2019-02-01 NOTE — Discharge Instructions (Addendum)
Wear TLSO when active for pain control

## 2019-02-01 NOTE — ED Notes (Signed)
Discharge instructions discussed with pt. Pt verbalized understanding with no questions at this time.  

## 2019-02-01 NOTE — ED Provider Notes (Signed)
Emergency Department Provider Note   I have reviewed the triage vital signs and the nursing notes.   HISTORY  Chief Complaint Back Pain   HPI [name] [name] is a 83 y.o. female here with progressively worsening back pain being worked up by orthopedists. Now with some tingling in her leg. No incontience. No recent trauma.    No other associated or modifying symptoms.    Past Medical History:  Diagnosis Date   Anxiety    takes Xanax daily as needed   Asthma    Albuterol daily as needed   Blood transfusion without reported diagnosis    CAD (coronary artery disease)    LHC 2003 with 40% pLAD, 30% mLAD, 100% D1 (moderate vessel), 100%  D2 (small vessel), 95% mid small OM2.  Pt had PTCA to D1;  D2 and OM2 small vessels and tx medically;  Last Myoview (2012): anterior infarct seen with scar, no ischemia. EF normal. Med Rx // Myoview (12/14):  Low risk; ant scar w/ peri-infarct ischemia; EF 55% with ant AK // Myoview 5/16: EF 55-65, ant, apical scar, no ischemia    Cataract    Chronic back pain    HNP, DDD, spinal stenosis, vertebral compression fractures.    Chronic nausea    takes Zofran daily as needed.  normal gastric emptying study 03/2013   CKD (chronic kidney disease), stage III ([place]) 09/27/2017   Constipation    felt to be functional. takes Senokot and Miralax daily.  treated with Linzess in past.    DIVERTICULOSIS, COLON 08/28/2008   Qualifier: Diagnosis of  By: [name], [name], [name]     Elevated LFTs 2015   probably med induced DILI, from Augmentin vs Pravastatin   GERD (gastroesophageal reflux disease)    hx of esophageal stricture    Hiatal hernia    Paraesophageal hernias. s/p fundoplications in 0000000 and 04/2013   History of bronchitis several of yrs ago   History of colon polyps 03/2009, 03/2011   adenomatous colon polyps, no high grade dysplasia.    History of vertigo    no meds   Hyperlipidemia    takes Pravastatin daily   Hypertension    takes Amlodipine,Metoprolol,and Losartan daily   Nausea 03/2016   Osteoporosis    PONV (postoperative nausea and vomiting)    yrs ago   Slow urinary stream    takes Rapaflo daily   TIA (transient ischemic attack) 1995   no residual effects noted    Patient Active Problem List   Diagnosis Date Noted   Nausea & vomiting 05/13/2018   Abnormal glucose 03/01/2018   FHx: heart disease 03/01/2018   Therapeutic opioid-induced constipation ([facility]) 12/21/2017   Abnormal UGI series 12/21/2017   CKD (chronic kidney disease) stage 3, GFR 30-59 ml/min ([facility]) 09/27/2017   T12 compression fracture ([place]) 09/26/2017   Chronic bronchitis ([place]) 08/09/2017   Atherosclerosis of aorta ([place]) 04/05/2017   Anxiety state 06/08/2016   Spinal stenosis, lumbar region, with neurogenic claudication 05/29/2015   DDD (degenerative disc disease), lumbar 04/21/2015   Hyperlipidemia, mixed 11/24/2014   Gastroesophageal reflux disease 11/24/2014   Depression, major, in partial remission ([place]) 09/26/2014   Chronic pain syndrome 09/26/2014   Coronary artery disease due to lipid rich plaque    Osteoporosis 02/10/2014   Prediabetes 08/14/2013   Vitamin D deficiency 08/14/2013   MGUS (monoclonal gammopathy of unknown significance) 03/05/2013   Vertebral compression fracture ([place]) 06/23/2012   ESOPHAGEAL STRICTURE 08/28/2008   Mild intermittent asthma without  complication [id number]   Incarcerated recurrent paraesophageal hiatal hernia s/p lap redo repair [id number] 01/31/2008   Essential hypertension 07/25/2007    Past Surgical History:  Procedure Laterality Date   APPENDECTOMY     patient unsure of date   BACK [place]     Patient unsure of date.   CORONARY ANGIOPLASTY  11/16/2001   1 stent   ESOPHAGEAL MANOMETRY N/A 03/25/2013   Procedure: ESOPHAGEAL MANOMETRY (EM);  Surgeon: Inda Castle, MD;  Location: [facility];  Service:  Endoscopy;  Laterality: N/A;   ESOPHAGOGASTRODUODENOSCOPY N/A 03/15/2013   Procedure: ESOPHAGOGASTRODUODENOSCOPY (EGD);  Surgeon: Jerene Bears, MD;  [facility]COPY;  Service: Endoscopy;  Laterality: N/A;   ESOPHAGOGASTRODUODENOSCOPY N/A 12/25/2015   Procedure: ESOPHAGOGASTRODUODENOSCOPY (EGD);  Surgeon: Doran Stabler, MD;  Location: [facility] ENDOSCOPY;  Service: Endoscopy;  Laterality: N/A;   EYE SURGERY  11/2000   bilateral cataracts with lens implant   FIXATION KYPHOPLASTY LUMBAR SPINE  X 2   "L3-4"   HEMORRHOID SURGERY     HIATAL HERNIA REPAIR  06/03/2011   Procedure: LAPAROSCOPIC REPAIR OF HIATAL HERNIA;  Surgeon: Adin Hector, MD;  Location: [facility] ORS;  Service: General;  Laterality: N/A;   HIATAL HERNIA REPAIR N/A 04/24/2013   Procedure: LAPAROSCOPIC  REPAIR RECURRENT PARASOPHAGEAL HIATAL HERNIA WITH FUNDOPLICATION;  Surgeon: Adin Hector, MD;  Location: [facility];  Service: General;  Laterality: N/A;   INSERTION OF MESH N/A 04/24/2013   Procedure: INSERTION OF MESH;  Surgeon: Adin Hector, MD;  Location: [facility] OR[facility];  Service: General;  Laterality: N/A;   IR KYPHO THORACIC WITH BONE BIOPSY  09/29/2017   IR KYPHO THORACIC WITH BONE BIOPSY  11/03/2017   LAPAROSCOPIC LYSIS OF ADHESIONS N/A 04/24/2013   Procedure: LAPAROSCOPIC LYSIS OF ADHESIONS;  Surgeon: Adin Hector, MD;  Location: [facility];  Service: General;  Laterality: N/A;   LAPAROSCOPIC NISSEN FUNDOPLICATION  [id number]   Procedure: LAPAROSCOPIC NISSEN FUNDOPLICATION;  Surgeon: Adin Hector, MD;  Location: [facility];  Service: General;  Laterality: N/A;   LUMBAR FUSION  12/07/2015   Right L2-3 facetectomy, posterolateral fusion L2-3 with pedicle screws, rods, local bone graft, Vivigen cancellous chips. Fusion extended to the L1 level with pedicle screws and rods   LUMBAR LAMINECTOMY/DECOMPRESSION MICRODISCECTOMY Right 06/26/2012   Procedure: LUMBAR LAMINECTOMY/DECOMPRESSION MICRODISCECTOMY;  Surgeon: Jessy Oto,  MD;  Location: [place];  Service: Orthopedics;  Laterality: Right;  RIGHT L4-5 MICRODISCECTOMY   LUMBAR LAMINECTOMY/DECOMPRESSION MICRODISCECTOMY N/A 05/29/2015   Procedure: RIGHT L2-3 MICRODISCECTOMY;  Surgeon: Jessy Oto, MD;  Location: [place];  Service: Orthopedics;  Laterality: N/A;   TOTAL ABDOMINAL HYSTERECTOMY  1975   partial   UPPER GASTROINTESTINAL ENDOSCOPY      Current Outpatient Rx   Order #: [id number] Class: Normal   Order #: [id number] Class: Normal   Order #: [id number] Class: Normal   Order #: [id number] Class: Normal   Order #: [id number] Class: Normal   Order #: [id number] Class: Historical Med   Order #: [id number] Class: Historical Med   Order #: [id number] Class: No Print   Order #: [id number] Class: Normal   Order #: [id number] Class: Normal   Order #: [id number] Class: Normal   Order #: [id number] Class: Historical Med   Order #: [id number] Class: Normal   Order #: [id number] Class: Historical Med   Order #: [id number] Class: Normal   Order #: [id number] Class: Historical Med   Order #: [id number] Class: Normal   Order #: [id number] Class: Normal   Order #:  [id number] Class: Normal   Order #: [id number] Class: Normal   Order #: [id number] Class: No Print   Order #: [id number] Class: Print   Order #: [id number] Class: No Print   Order #: [id number] Class: Historical Med   Order #: [id number] Class: Normal   Order #: [id number] Class: Normal   Order #: [id number] Class: Normal   Order #: [id number] Class: No Print   Order #: [id number] Class: Historical Med   Order #: [id number] Class: Normal    Allergies Ace inhibitors, Aspartame, Atorvastatin, Biaxin [clarithromycin], Daliresp [roflumilast], Erythromycin, and Levofloxacin  Family History  Problem Relation Age of Onset   Colon cancer Mother 55   Heart attack Father 30       heart attack   Brain cancer Brother        tumor    Heart disease Brother    Heart disease Brother    Kidney disease Daughter    Esophageal  cancer Neg Hx    Stomach cancer Neg Hx     Social History Social History   Tobacco Use   Smoking status: Never Smoker   Smokeless tobacco: Never Used  Substance Use Topics   Alcohol use: No   Drug use: Never    Review of Systems  All other systems negative except as documented in the HPI. All pertinent positives and negatives as reviewed in the HPI. ____________________________________________   PHYSICAL EXAM:  VITAL SIGNS: ED Triage Vitals  Enc Vitals Group     BP 01/31/19 1522 (!) 160/79     Pulse Rate 01/31/19 1522 94     Resp 01/31/19 1522 17     Temp 01/31/19 1522 98.5 F (36.9 C)     Temp Source 01/31/19 1522 Oral     SpO2 01/31/19 1522 96 %     Weight 01/31/19 1521 170 lb (77.1 kg)     Height 01/31/19 1521 5\' 3"  (1.6 m)     Head Circumference --      Peak Flow --      Pain Score 01/31/19 1521 8     Pain Loc --      Pain Edu? --      Excl. in [place]? --     Constitutional: Alert and oriented. Well appearing and in no acute distress. Eyes: Conjunctivae are normal. PERRL. EOMI. Head: Atraumatic. Nose: No congestion/rhinnorhea. Mouth/Throat: Mucous membranes are moist.  Oropharynx non-erythematous. Neck: No stridor.  No meningeal signs.   Cardiovascular: Normal rate, regular rhythm. Good peripheral circulation. Grossly normal heart sounds.   Respiratory: Normal respiratory effort.  No retractions. Lungs CTAB. Gastrointestinal: Soft and nontender. No distention.  Musculoskeletal: No lower extremity tenderness nor edema. No gross deformities of extremities. Neurologic:  Normal speech and language. No gross focal neurologic deficits are appreciated.  Skin:  Skin is warm, dry and intact. No rash noted.   ____________________________________________   LABS (all labs ordered are listed, but only abnormal results are displayed)  Labs Reviewed - No data to display ____________________________________________  EKG   EKG Interpretation  Date/Time:      Ventricular Rate:    PR Interval:    QRS Duration:   QT Interval:    QTC Calculation:   R Axis:     Text Interpretation:         ____________________________________________  RADIOLOGY  Ct Thoracic Spine Wo Contrast  Result Date: 02/01/2019 CLINICAL DATA:  Initial evaluation for acute mid to upper back pain with right lower extremity paresthesias. EXAM: CT THORACIC AND LUMBAR SPINE WITHOUT CONTRAST TECHNIQUE: Multidetector CT imaging of  the thoracic and lumbar spine was performed without contrast. Multiplanar CT image reconstructions were also generated. COMPARISON:  Prior radiograph from 01/21/2019 as well as previous MRI from 11/06/2017. FINDINGS: CT THORACIC SPINE FINDINGS Alignment: Mild straightening of the normal thoracic kyphosis. No listhesis or malalignment. Vertebrae: Chronic compression deformities involving the T11 and T12 vertebral bodies with sequelae of prior vertebral augmentation. There is an acute compression fracture involving the superior endplate of T9 with no more than mild 20% central height loss. No significant bony retropulsion or stenosis. Otherwise, vertebral body heights maintained without evidence for acute fracture. No discrete osseous lesions. Paraspinal and other soft tissues: Mild paraspinous edema noted adjacent to the T9 compression fracture. Paraspinous soft tissues demonstrate no other acute finding. Atelectatic changes noted dependently within the visualized lungs. Moderate aortic atherosclerosis. Moderate hiatal hernia. Prior cholecystectomy. Disc levels: Multilevel facet degeneration noted within the thoracic spine. No significant disc pathology seen within the upper or midthoracic spine. 4 mm bony retropulsion at T11-12 related to the chronic T12 compression fracture with resultant mild spinal stenosis. No other significant spinal stenosis within the thoracic spine. Foramina remain patent. CT LUMBAR SPINE FINDINGS Segmentation: Standard. Lowest well-formed  disc labeled the L5-S1 level. Alignment: Mild levoscoliosis with apex at L2. 3 mm anterolisthesis of L4 on L5, chronic and facet mediated, and stable from previous. Focal kyphotic angulation at the thoracolumbar junction. Overall, appearance is stable. Vertebrae: Chronic compression deformities involving the T11 through L4 vertebral bodies again seen, stable. Sequelae of prior vertebral augmentation at T11, T12, L2, L3, and L4. Overall, vertebral body height maintained without evidence for acute or interval fracture. Visualized sacrum and pelvis intact. No discrete or worrisome osseous lesions. Patient is status post posterior fusion at L1 through L3 with bilateral transpedicular screws in place. Lucency seen about the pedicular screws bilaterally at the level of L1, compatible with loosening (series 17, image 45). The right transpedicular screw at L2 lies outside the confines of the L2 vertebral body. Adjacent extruded cement material noted. Hardware itself is intact without fracture. Paraspinal and other soft tissues: Paraspinous soft tissues demonstrate no acute finding. Normal expected chronic postoperative changes noted within the posterior paraspinous musculature. Moderate aorto bi-iliac atherosclerotic disease. Prior cholecystectomy. Disc levels: L1-2: Prior posterior fusion. Bilateral facet hypertrophy with minimal disc bulge. No canal or foraminal stenosis. L2-3: Mild diffuse disc bulge. Chronic 4 mm bony retropulsion related to the chronic L3 compression fracture with flattening of the ventral thecal sac, greater on the right. Prior posterior fusion with decompressive right hemi laminectomy. Mild spinal stenosis grossly similar to previous MRI. Moderate left L2 foraminal stenosis. Right neural foramen remains patent. L3-4: Mild diffuse disc bulge. Moderate facet and ligament flavum hypertrophy. Resultant mild spinal stenosis. Moderate bilateral L3 foraminal narrowing. L4-5: Anterolisthesis. Mild diffuse  disc bulge, slightly asymmetric to the left. Advanced bilateral facet degeneration. Prior posterior decompression. Stable mild spinal stenosis. Mild bilateral L4 foraminal narrowing, greater on the left, relatively unchanged. L5-S1: Chronic intervertebral disc space narrowing with diffuse disc bulge and disc desiccation. Reactive endplate changes with marginal endplate osteophytic spurring. Posterior decompression. Moderate facet hypertrophy. Thecal sac remains patent. Moderate bilateral L5 foraminal stenosis. IMPRESSION: CT THORACIC SPINE IMPRESSION 1. Acute compression fracture involving the superior endplate of T9 with mild 20% central height loss without bony retropulsion. 2. No other acute abnormality within the thoracic spine. 3. Chronic compression fractures involving the T11 and T12 vertebral bodies with sequelae of prior vertebral augmentation. 4 mm bony retropulsion at T12 with resultant  mild spinal stenosis. CT LUMBAR SPINE IMPRESSION 1. No acute fracture or other osseous abnormality within the lumbar spine. 2. Chronic compression fractures involving the T11 through L4 vertebral bodies with sequelae of prior vertebral augmentation. 3. Prior posterior fusion at L1 through L3. Lucency about the transpedicular screws at L1 compatible with loosening. The right transpedicular screw at L2 lies outside the confines of the L2 vertebral body. 4. Multilevel lumbar spondylosis as above, not significantly changed relative to previous MRI from 2019. Resultant mild spinal stenosis at L2-3 and L3-4. Moderate foraminal narrowing on the left at L2 and bilaterally at L3 and L5. A as a gone thank in Electronically Signed   By: Jeannine Boga M.D.   On: 02/01/2019 00:18   Ct Lumbar Spine Wo Contrast  Result Date: 02/01/2019 CLINICAL DATA:  Initial evaluation for acute mid to upper back pain with right lower extremity paresthesias. EXAM: CT THORACIC AND LUMBAR SPINE WITHOUT CONTRAST TECHNIQUE: Multidetector CT  imaging of the thoracic and lumbar spine was performed without contrast. Multiplanar CT image reconstructions were also generated. COMPARISON:  Prior radiograph from 01/21/2019 as well as previous MRI from 11/06/2017. FINDINGS: CT THORACIC SPINE FINDINGS Alignment: Mild straightening of the normal thoracic kyphosis. No listhesis or malalignment. Vertebrae: Chronic compression deformities involving the T11 and T12 vertebral bodies with sequelae of prior vertebral augmentation. There is an acute compression fracture involving the superior endplate of T9 with no more than mild 20% central height loss. No significant bony retropulsion or stenosis. Otherwise, vertebral body heights maintained without evidence for acute fracture. No discrete osseous lesions. Paraspinal and other soft tissues: Mild paraspinous edema noted adjacent to the T9 compression fracture. Paraspinous soft tissues demonstrate no other acute finding. Atelectatic changes noted dependently within the visualized lungs. Moderate aortic atherosclerosis. Moderate hiatal hernia. Prior cholecystectomy. Disc levels: Multilevel facet degeneration noted within the thoracic spine. No significant disc pathology seen within the upper or midthoracic spine. 4 mm bony retropulsion at T11-12 related to the chronic T12 compression fracture with resultant mild spinal stenosis. No other significant spinal stenosis within the thoracic spine. Foramina remain patent. CT LUMBAR SPINE FINDINGS Segmentation: Standard. Lowest well-formed disc labeled the L5-S1 level. Alignment: Mild levoscoliosis with apex at L2. 3 mm anterolisthesis of L4 on L5, chronic and facet mediated, and stable from previous. Focal kyphotic angulation at the thoracolumbar junction. Overall, appearance is stable. Vertebrae: Chronic compression deformities involving the T11 through L4 vertebral bodies again seen, stable. Sequelae of prior vertebral augmentation at T11, T12, L2, L3, and L4. Overall,  vertebral body height maintained without evidence for acute or interval fracture. Visualized sacrum and pelvis intact. No discrete or worrisome osseous lesions. Patient is status post posterior fusion at L1 through L3 with bilateral transpedicular screws in place. Lucency seen about the pedicular screws bilaterally at the level of L1, compatible with loosening (series 17, image 45). The right transpedicular screw at L2 lies outside the confines of the L2 vertebral body. Adjacent extruded cement material noted. Hardware itself is intact without fracture. Paraspinal and other soft tissues: Paraspinous soft tissues demonstrate no acute finding. Normal expected chronic postoperative changes noted within the posterior paraspinous musculature. Moderate aorto bi-iliac atherosclerotic disease. Prior cholecystectomy. Disc levels: L1-2: Prior posterior fusion. Bilateral facet hypertrophy with minimal disc bulge. No canal or foraminal stenosis. L2-3: Mild diffuse disc bulge. Chronic 4 mm bony retropulsion related to the chronic L3 compression fracture with flattening of the ventral thecal sac, greater on the right. Prior posterior fusion with decompressive  right hemi laminectomy. Mild spinal stenosis grossly similar to previous MRI. Moderate left L2 foraminal stenosis. Right neural foramen remains patent. L3-4: Mild diffuse disc bulge. Moderate facet and ligament flavum hypertrophy. Resultant mild spinal stenosis. Moderate bilateral L3 foraminal narrowing. L4-5: Anterolisthesis. Mild diffuse disc bulge, slightly asymmetric to the left. Advanced bilateral facet degeneration. Prior posterior decompression. Stable mild spinal stenosis. Mild bilateral L4 foraminal narrowing, greater on the left, relatively unchanged. L5-S1: Chronic intervertebral disc space narrowing with diffuse disc bulge and disc desiccation. Reactive endplate changes with marginal endplate osteophytic spurring. Posterior decompression. Moderate facet  hypertrophy. Thecal sac remains patent. Moderate bilateral L5 foraminal stenosis. IMPRESSION: CT THORACIC SPINE IMPRESSION 1. Acute compression fracture involving the superior endplate of T9 with mild 20% central height loss without bony retropulsion. 2. No other acute abnormality within the thoracic spine. 3. Chronic compression fractures involving the T11 and T12 vertebral bodies with sequelae of prior vertebral augmentation. 4 mm bony retropulsion at T12 with resultant mild spinal stenosis. CT LUMBAR SPINE IMPRESSION 1. No acute fracture or other osseous abnormality within the lumbar spine. 2. Chronic compression fractures involving the T11 through L4 vertebral bodies with sequelae of prior vertebral augmentation. 3. Prior posterior fusion at L1 through L3. Lucency about the transpedicular screws at L1 compatible with loosening. The right transpedicular screw at L2 lies outside the confines of the L2 vertebral body. 4. Multilevel lumbar spondylosis as above, not significantly changed relative to previous MRI from 2019. Resultant mild spinal stenosis at L2-3 and L3-4. Moderate foraminal narrowing on the left at L2 and bilaterally at L3 and L5. A as a gone thank in Electronically Signed   By: Jeannine Boga M.D.   On: 02/01/2019 00:18    ____________________________________________    INITIAL IMPRESSION / ASSESSMENT AND PLAN / ED COURSE  No obvious emergent red flags, ortho planning on ct scan, will go ahead and order now.   New T9 compression fracture. Will fu w/ ortho.   Pertinent labs & imaging results that were available during my care of the patient were reviewed by me and considered in my medical decision making (see chart for details).  A medical screening exam was performed and I feel the patient has had an appropriate workup for their chief complaint at this time and likelihood of emergent condition existing is low. They have been counseled on decision, discharge, follow up and which  symptoms necessitate immediate return to the emergency department. They or their family verbally stated understanding and agreement with plan and discharged in stable condition.   ____________________________________________  FINAL CLINICAL IMPRESSION(S) / ED DIAGNOSES  Final diagnoses:  Compression fracture of T9 vertebra, initial encounter ([place])    MEDICATIONS GIVEN DURING THIS VISIT:  Medications  oxyCODONE-acetaminophen (PERCOCET/ROXICET) 5-325 MG per tablet 1 tablet (1 tablet Oral Given 01/31/19 1536)  ondansetron (ZOFRAN-ODT) disintegrating tablet 4 mg (4 mg Oral Given 01/31/19 1536)  HYDROmorphone (DILAUDID) injection 1 mg (1 mg Intramuscular Given 01/31/19 2349)  methocarbamol (ROBAXIN) tablet 500 mg (500 mg Oral Given 01/31/19 2349)  predniSONE (DELTASONE) tablet 60 mg (60 mg Oral Given 01/31/19 2349)     NEW OUTPATIENT MEDICATIONS STARTED DURING THIS VISIT:  Current Discharge Medication List    START taking these medications   Details  diazepam (VALIUM) 5 MG tablet Take 0.5 tablets (2.5 mg total) by mouth every 6 (six) hours as needed (spasms). Qty: 10 tablet, Refills: 0    predniSONE (DELTASONE) 20 MG tablet 3 tabs po daily x 3 days, then  2 tabs x 3 days, then 1.5 tabs x 3 days, then 1 tab x 3 days, then 0.5 tabs x 3 days Qty: 27 tablet, Refills: 0        Note:  This note was prepared with assistance of Dragon voice recognition software. Occasional wrong-word or sound-a-like substitutions may have occurred due to the inherent limitations of voice recognition software.   Chasidy Janak, Corene Cornea, MD 02/01/19 [phone]

## 2019-02-04 ENCOUNTER — Encounter: Payer: Self-pay | Admitting: Specialist

## 2019-02-04 ENCOUNTER — Telehealth: Payer: Self-pay | Admitting: Specialist

## 2019-02-04 ENCOUNTER — Ambulatory Visit (INDEPENDENT_AMBULATORY_CARE_PROVIDER_SITE_OTHER): Payer: PPO | Admitting: Specialist

## 2019-02-04 VITALS — BP 170/96 | HR 108 | Ht 63.0 in | Wt 173.0 lb

## 2019-02-04 DIAGNOSIS — G8911 Acute pain due to trauma: Secondary | ICD-10-CM

## 2019-02-04 DIAGNOSIS — M8000XA Age-related osteoporosis with current pathological fracture, unspecified site, initial encounter for fracture: Secondary | ICD-10-CM | POA: Diagnosis not present

## 2019-02-04 DIAGNOSIS — S22070G Wedge compression fracture of T9-T10 vertebra, subsequent encounter for fracture with delayed healing: Secondary | ICD-10-CM | POA: Diagnosis not present

## 2019-02-04 NOTE — Telephone Encounter (Signed)
Patient is having a lot of pain. Would like to know what to do. Her call back number is [phone]

## 2019-02-04 NOTE — Progress Notes (Signed)
Office Visit Note   Patient: [name] [name]           Date of Birth: October 17, 1935           MRN: [id number] Visit Date: 02/04/2019              Requested by: [name], [address] [place],  [place] [address] PCP: [name], MD   Assessment & Plan: Visit Diagnoses:  1. Compression fracture of T9 vertebra with delayed healing   2. Acute pain due to trauma   3. Osteoporosis with current pathological fracture, unspecified osteoporosis type, initial encounter     Plan: Avoid frequent bending and stooping  No lifting greater than 10 lbs. May use ice or moist heat for pain. Weight loss is of benefit. Hydrocodone for pain. Exercise is important to improve your indurance and does allow people to function better inspite of back pain. MRI of the thoracic spine ordered to asses the T9 level for any sign of canal narrowing in preparation for probable  Kyphoplasty. A consult with Dr. Minta Balsam ordered for T9 Kyphoplasty, has had several previous kyphoplasties  Follow-Up Instructions: Return in about 3 weeks (around 02/25/2019).   Orders:  Orders Placed This Encounter  Procedures  . MR Thoracic Spine w/o contrast  . Ambulatory referral to Interventional Radiology   No orders of the defined types were placed in this encounter.     Procedures: No procedures performed   Clinical Data: No additional findings.   Subjective: Chief Complaint  Patient presents with  . Spine - Follow-up    Post CT scan of Lumbar and Thoracic spine review    83 year old female with nearly 6 weeks of severe pain in the mid lower dorsal spine, seen initially with concern of hip pain but MRI was negative. Persistent back pain. Seen an a CT scan scheduled, she however went to the ER due to severe pain and there CT scan was done emergently and showed a new compression fracture superior endplate T9.  No bowel or bladder difficulty. Pain is not controlled with  hydrocodone.    Review of Systems  Constitutional: Negative.   HENT: Negative.   Eyes: Negative.   Respiratory: Negative.   Cardiovascular: Negative.   Gastrointestinal: Negative.   Endocrine: Negative.   Genitourinary: Negative.   Musculoskeletal: Negative.   Skin: Negative.   Allergic/Immunologic: Negative.   Neurological: Negative.   Hematological: Negative.   Psychiatric/Behavioral: Negative.      Objective: Vital Signs: BP (!) 170/96 (BP Location: Left Arm, Patient Position: Sitting)   Pulse (!) 108   Ht 5\' 3"  (1.6 m)   Wt 173 lb (78.5 kg)   BMI 30.65 kg/m   Physical Exam Constitutional:      Appearance: She is well-developed.  HENT:     Head: Normocephalic and atraumatic.  Eyes:     Pupils: Pupils are equal, round, and reactive to light.  Neck:     Musculoskeletal: Normal range of motion and neck supple.  Pulmonary:     Effort: Pulmonary effort is normal.     Breath sounds: Normal breath sounds.  Abdominal:     General: Bowel sounds are normal.     Palpations: Abdomen is soft.  Skin:    General: Skin is warm and dry.  Neurological:     Mental Status: She is alert and oriented to person, place, and time.  Psychiatric:        Behavior: Behavior normal.  Thought Content: Thought content normal.        Judgment: Judgment normal.     Back Exam   Tenderness  The patient is experiencing tenderness in the thoracic.  Range of Motion  Extension: abnormal  Flexion: abnormal  Lateral bend right: abnormal  Lateral bend left: abnormal  Rotation right: abnormal  Rotation left: abnormal   Muscle Strength  Right Quadriceps:  5/5  Left Quadriceps:  5/5  Right Hamstrings:  5/5  Left Hamstrings:  5/5   Reflexes  Patellar: 0/4 Achilles: 2/4 Biceps: 2/4 Babinski's sign: normal   Other  Toe walk: abnormal Heel walk: abnormal Sensation: normal Gait: normal   Comments:  Tender mid lower thoracic spine in the midline.      Specialty  Comments:  No specialty comments available.  Imaging: No results found.   PMFS History: Patient Active Problem List   Diagnosis Date Noted  . Nausea & vomiting 05/13/2018  . Abnormal glucose 03/01/2018  . FHx: heart disease 03/01/2018  . Therapeutic opioid-induced constipation (OIC) 12/21/2017  . Abnormal UGI series 12/21/2017  . CKD (chronic kidney disease) stage 3, GFR 30-59 ml/min (HCC) 09/27/2017  . T12 compression fracture ([place]) 09/26/2017  . Chronic bronchitis ([place]) 08/09/2017  . Atherosclerosis of aorta ([place]) 04/05/2017  . Anxiety state 06/08/2016  . Spinal stenosis, lumbar region, with neurogenic claudication 05/29/2015  . DDD (degenerative disc disease), lumbar 04/21/2015  . Hyperlipidemia, mixed 11/24/2014  . Gastroesophageal reflux disease 11/24/2014  . Depression, major, in partial remission ([place]) 09/26/2014  . Chronic pain syndrome 09/26/2014  . Coronary artery disease due to lipid rich plaque   . Osteoporosis 02/10/2014  . Prediabetes 08/14/2013  . Vitamin D deficiency 08/14/2013  . MGUS (monoclonal gammopathy of unknown significance) 03/05/2013  . Vertebral compression fracture ([facility]) 06/23/2012  . ESOPHAGEAL STRICTURE 08/28/2008  . Mild intermittent asthma without complication [id number]  . Incarcerated recurrent paraesophageal hiatal hernia s/p lap redo repair [id number] 01/31/2008  . Essential hypertension 07/25/2007   Past Medical History:  Diagnosis Date  . Anxiety    takes Xanax daily as needed  . Asthma    Albuterol daily as needed  . Blood transfusion without reported diagnosis   . CAD (coronary artery disease)    LHC 2003 with 40% pLAD, 30% mLAD, 100% D1 (moderate vessel), 100%  D2 (small vessel), 95% mid small OM2.  Pt had PTCA to D1;  D2 and OM2 small vessels and tx medically;  Last Myoview (2012): anterior infarct seen with scar, no ischemia. EF normal. Med Rx // Myoview (12/14):  Low risk; ant scar w/ peri-infarct ischemia; EF 55% with ant AK //  Myoview 5/16: EF 55-65, ant, apical scar, no ischemia   . Cataract   . Chronic back pain    HNP, DDD, spinal stenosis, vertebral compression fractures.   . Chronic nausea    takes Zofran daily as needed.  normal gastric emptying study 03/2013  . CKD (chronic kidney disease), stage III ([facility]) 09/27/2017  . Constipation    felt to be functional. takes Senokot and Miralax daily.  treated with Linzess in past.   . DIVERTICULOSIS, COLON 08/28/2008   Qualifier: Diagnosis of  By: [name], [name], [name]    . Elevated LFTs 2015   probably med induced DILI, from Augmentin vs Pravastatin  . GERD (gastroesophageal reflux disease)    hx of esophageal stricture   . Hiatal hernia    Paraesophageal hernias. s/p fundoplications in 0000000 and 04/2013  . History of bronchitis several  of yrs ago  . History of colon polyps 03/2009, 03/2011   adenomatous colon polyps, no high grade dysplasia.   [name] History of vertigo    no meds  . Hyperlipidemia    takes Pravastatin daily  . Hypertension    takes Amlodipine,Metoprolol,and Losartan daily  . Nausea 03/2016  . Osteoporosis   . PONV (postoperative nausea and vomiting)    yrs ago  . Slow urinary stream    takes Rapaflo daily  . TIA (transient ischemic attack) 1995   no residual effects noted    Family History  Problem Relation Age of Onset  . Colon cancer Mother 63  . Heart attack Father 47       heart attack  . Brain cancer Brother        tumor   . Heart disease Brother   . Heart disease Brother   . Kidney disease Daughter   . Esophageal cancer Neg Hx   . Stomach cancer Neg Hx     Past Surgical History:  Procedure Laterality Date  . APPENDECTOMY     patient unsure of date  . BACK SURGERY    . BLADDER REPAIR    . CHOLECYSTECTOMY     Patient unsure of date.  . CORONARY ANGIOPLASTY  11/16/2001   1 stent  . ESOPHAGEAL MANOMETRY N/A 03/25/2013   Procedure: ESOPHAGEAL MANOMETRY (EM);  Surgeon: Inda Castle, MD;  Location: [facility];  Service:  Endoscopy;  Laterality: N/A;  . ESOPHAGOGASTRODUODENOSCOPY N/A 03/15/2013   Procedure: ESOPHAGOGASTRODUODENOSCOPY (EGD);  Surgeon: Jerene Bears, MD;  [facility] END[facility];  Service: Endoscopy;  Laterality: N/A;  . ESOPHAGOGASTRODUODENOSCOPY N/A 12/25/2015   Procedure: ESOPHAGOGASTRODUODENOSCOPY (EGD);  Surgeon: Doran Stabler, MD;  Location: [facility] ENDOSCOPY;  Service: Endoscopy;  Laterality: N/A;  . EYE SURGERY  11/2000   bilateral cataracts with lens implant  . FIXATION KYPHOPLASTY LUMBAR SPINE  X 2   "L3-4"  . HEMORRHOID SURGERY    . HIATAL HERNIA REPAIR  06/03/2011   Procedure: LAPAROSCOPIC REPAIR OF HIATAL HERNIA;  Surgeon: Adin Hector, MD;  Location: [facility];  Service: General;  Laterality: N/A;  . HIATAL HERNIA REPAIR N/A 04/24/2013   Procedure: LAPAROSCOPIC  REPAIR RECURRENT PARASOPHAGEAL HIATAL HERNIA WITH FUNDOPLICATION;  Surgeon: Adin Hector, MD;  Location: [facility];  Service: General;  Laterality: N/A;  . INSERTION OF MESH N/A 04/24/2013   Procedure: INSERTION OF MESH;  Surgeon: Adin Hector, MD;  Location: [facility];  Service: General;  Laterality: N/A;  . IR KYPHO THORACIC WITH BONE BIOPSY  09/29/2017  . IR KYPHO THORACIC WITH BONE BIOPSY  11/03/2017  . LAPAROSCOPIC LYSIS OF ADHESIONS N/A 04/24/2013   Procedure: LAPAROSCOPIC LYSIS OF ADHESIONS;  Surgeon: Adin Hector, MD;  Location: [facility];  Service: General;  Laterality: N/A;  . LAPAROSCOPIC NISSEN FUNDOPLICATION  [id number]   Procedure: LAPAROSCOPIC NISSEN FUNDOPLICATION;  Surgeon: Adin Hector, MD;  Location: [facility];  Service: General;  Laterality: N/A;  . LUMBAR FUSION  12/07/2015   Right L2-3 facetectomy, posterolateral fusion L2-3 with pedicle screws, rods, local bone graft, Vivigen cancellous chips. Fusion extended to the L1 level with pedicle screws and rods  . LUMBAR LAMINECTOMY/DECOMPRESSION MICRODISCECTOMY Right 06/26/2012   Procedure: LUMBAR LAMINECTOMY/DECOMPRESSION MICRODISCECTOMY;  Surgeon: Jessy Oto,  MD;  Location: [facility];  Service: Orthopedics;  Laterality: Right;  RIGHT L4-5 MICRODISCECTOMY  . LUMBAR LAMINECTOMY/DECOMPRESSION MICRODISCECTOMY N/A 05/29/2015   Procedure: RIGHT L2-3 MICRODISCECTOMY;  Surgeon: Jessy Oto,  MD;  Location: [facility];  Service: Orthopedics;  Laterality: N/A;  . TOTAL ABDOMINAL HYSTERECTOMY  1975   partial  . UPPER GASTROINTESTINAL ENDOSCOPY     Social History   Occupational History  . Occupation: Retired    Fish farm manager: RETIRED    Comment: worked at [employer]  . Smoking status: Never Smoker  . Smokeless tobacco: Never Used  Substance and Sexual Activity  . Alcohol use: No  . Drug use: Never  . Sexual activity: Not on file

## 2019-02-04 NOTE — Patient Instructions (Signed)
Avoid frequent bending and stooping  No lifting greater than 10 lbs. May use ice or moist heat for pain. Weight loss is of benefit. Hydrocodone for pain. Exercise is important to improve your indurance and does allow people to function better inspite of back pain. MRI of the thoracic spine ordered to asses the T9 level for any sign of canal narrowing in preparation for probable  Kyphoplasty. A consult with Dr. Minta Balsam ordered for T9 Kyphoplasty, has had several previous kyphoplasties.

## 2019-02-04 NOTE — Telephone Encounter (Signed)
Patient is coming in this afternoon to be seen with Dr. Louanne Skye

## 2019-02-06 ENCOUNTER — Other Ambulatory Visit: Payer: Self-pay | Admitting: Internal Medicine

## 2019-02-07 ENCOUNTER — Other Ambulatory Visit: Payer: PPO

## 2019-02-07 ENCOUNTER — Other Ambulatory Visit: Payer: Self-pay | Admitting: Internal Medicine

## 2019-02-07 ENCOUNTER — Other Ambulatory Visit: Payer: Self-pay | Admitting: Orthopaedic Surgery

## 2019-02-07 ENCOUNTER — Ambulatory Visit: Payer: PPO | Admitting: Surgery

## 2019-02-07 ENCOUNTER — Other Ambulatory Visit: Payer: Self-pay

## 2019-02-07 ENCOUNTER — Other Ambulatory Visit: Payer: Self-pay | Admitting: Specialist

## 2019-02-07 ENCOUNTER — Ambulatory Visit
Admission: RE | Admit: 2019-02-07 | Discharge: 2019-02-07 | Disposition: A | Discharge status: Home / Self Care | Payer: PPO | Source: Ambulatory Visit | Attending: Specialist | Admitting: Specialist

## 2019-02-07 DIAGNOSIS — G8911 Acute pain due to trauma: Secondary | ICD-10-CM

## 2019-02-07 DIAGNOSIS — M546 Pain in thoracic spine: Secondary | ICD-10-CM | POA: Diagnosis not present

## 2019-02-07 MED ORDER — HYDROCODONE-ACETAMINOPHEN 7.5-325 MG PO TABS: 1.0000 | ORAL_TABLET | Freq: Four times a day (QID) | ORAL | 0 refills | Status: DC | PRN

## 2019-02-07 MED ORDER — AMLODIPINE BESYLATE 10 MG PO TABS: ORAL_TABLET | ORAL | 3 refills | Status: DC

## 2019-02-07 NOTE — Telephone Encounter (Signed)
Sent request to Dr. Nitka 

## 2019-02-07 NOTE — Addendum Note (Signed)
Addended by: Minda Ditto, Alyse Low N on: 02/07/2019 03:15 PM   Modules accepted: Orders

## 2019-02-07 NOTE — Telephone Encounter (Signed)
Please advise 

## 2019-02-07 NOTE — Telephone Encounter (Signed)
Patient called. Says she has 8 hydrocodones left. Would like to be able to get a refill. Her call back number is [phone]

## 2019-02-11 DIAGNOSIS — R3912 Poor urinary stream: Secondary | ICD-10-CM | POA: Diagnosis not present

## 2019-02-11 DIAGNOSIS — R351 Nocturia: Secondary | ICD-10-CM | POA: Diagnosis not present

## 2019-02-11 DIAGNOSIS — N3946 Mixed incontinence: Secondary | ICD-10-CM | POA: Diagnosis not present

## 2019-02-13 ENCOUNTER — Other Ambulatory Visit (HOSPITAL_COMMUNITY): Payer: Self-pay | Admitting: Interventional Radiology

## 2019-02-13 ENCOUNTER — Other Ambulatory Visit: Payer: Self-pay | Admitting: Student

## 2019-02-13 DIAGNOSIS — S22070A Wedge compression fracture of T9-T10 vertebra, initial encounter for closed fracture: Secondary | ICD-10-CM

## 2019-02-14 ENCOUNTER — Ambulatory Visit (HOSPITAL_COMMUNITY)
Admission: RE | Admit: 2019-02-14 | Discharge: 2019-02-14 | Disposition: A | Discharge status: Home / Self Care | Payer: PPO | Source: Ambulatory Visit | Attending: Interventional Radiology | Admitting: Interventional Radiology

## 2019-02-14 ENCOUNTER — Other Ambulatory Visit: Payer: Self-pay

## 2019-02-14 ENCOUNTER — Ambulatory Visit (INDEPENDENT_AMBULATORY_CARE_PROVIDER_SITE_OTHER): Payer: PPO | Admitting: *Deleted

## 2019-02-14 DIAGNOSIS — Z8673 Personal history of transient ischemic attack (TIA), and cerebral infarction without residual deficits: Secondary | ICD-10-CM | POA: Insufficient documentation

## 2019-02-14 DIAGNOSIS — Z7982 Long term (current) use of aspirin: Secondary | ICD-10-CM | POA: Diagnosis not present

## 2019-02-14 DIAGNOSIS — Z79899 Other long term (current) drug therapy: Secondary | ICD-10-CM | POA: Insufficient documentation

## 2019-02-14 DIAGNOSIS — I129 Hypertensive chronic kidney disease with stage 1 through stage 4 chronic kidney disease, or unspecified chronic kidney disease: Secondary | ICD-10-CM | POA: Diagnosis not present

## 2019-02-14 DIAGNOSIS — Z23 Encounter for immunization: Secondary | ICD-10-CM | POA: Diagnosis not present

## 2019-02-14 DIAGNOSIS — F419 Anxiety disorder, unspecified: Secondary | ICD-10-CM | POA: Insufficient documentation

## 2019-02-14 DIAGNOSIS — I2510 Atherosclerotic heart disease of native coronary artery without angina pectoris: Secondary | ICD-10-CM | POA: Diagnosis not present

## 2019-02-14 DIAGNOSIS — K219 Gastro-esophageal reflux disease without esophagitis: Secondary | ICD-10-CM | POA: Insufficient documentation

## 2019-02-14 DIAGNOSIS — E785 Hyperlipidemia, unspecified: Secondary | ICD-10-CM | POA: Insufficient documentation

## 2019-02-14 DIAGNOSIS — X58XXXA Exposure to other specified factors, initial encounter: Secondary | ICD-10-CM | POA: Insufficient documentation

## 2019-02-14 DIAGNOSIS — N183 Chronic kidney disease, stage 3 unspecified: Secondary | ICD-10-CM | POA: Insufficient documentation

## 2019-02-14 DIAGNOSIS — J45909 Unspecified asthma, uncomplicated: Secondary | ICD-10-CM | POA: Diagnosis not present

## 2019-02-14 DIAGNOSIS — S22070A Wedge compression fracture of T9-T10 vertebra, initial encounter for closed fracture: Secondary | ICD-10-CM | POA: Diagnosis not present

## 2019-02-14 DIAGNOSIS — M4854XA Collapsed vertebra, not elsewhere classified, thoracic region, initial encounter for fracture: Secondary | ICD-10-CM | POA: Diagnosis not present

## 2019-02-14 DIAGNOSIS — M545 Low back pain: Secondary | ICD-10-CM | POA: Diagnosis not present

## 2019-02-14 HISTORY — PX: IR KYPHO THORACIC WITH BONE BIOPSY: IMG5518

## 2019-02-14 LAB — URINALYSIS, COMPLETE (UACMP) WITH MICROSCOPIC
Bilirubin Urine: NEGATIVE
Glucose, UA: NEGATIVE mg/dL
Hgb urine dipstick: NEGATIVE
Ketones, ur: NEGATIVE mg/dL
Leukocytes,Ua: NEGATIVE
Nitrite: NEGATIVE
Protein, ur: NEGATIVE mg/dL
Specific Gravity, Urine: 1.013 (ref 1.005–1.030)
pH: 7 (ref 5.0–8.0)

## 2019-02-14 LAB — CBC
HCT: 44.3 % (ref 36.0–46.0)
Hemoglobin: 14.2 g/dL (ref 12.0–15.0)
MCH: 31.9 pg (ref 26.0–34.0)
MCHC: 32.1 g/dL (ref 30.0–36.0)
MCV: 99.6 fL (ref 80.0–100.0)
Platelets: 319 10*3/uL (ref 150–400)
RBC: 4.45 MIL/uL (ref 3.87–5.11)
RDW: 14.2 % (ref 11.5–15.5)
WBC: 10.8 10*3/uL — ABNORMAL HIGH (ref 4.0–10.5)
nRBC: 0 % (ref 0.0–0.2)

## 2019-02-14 LAB — PROTIME-INR
INR: 0.9 (ref 0.8–1.2)
Prothrombin Time: 11.8 seconds (ref 11.4–15.2)

## 2019-02-14 LAB — APTT: aPTT: 24 seconds (ref 24–36)

## 2019-02-14 MED ORDER — TOBRAMYCIN SULFATE 1.2 G IJ SOLR
INTRAMUSCULAR | Status: AC
  Filled 2019-02-14: qty 1.2

## 2019-02-14 MED ORDER — IOHEXOL 300 MG/ML  SOLN
50.0000 mL | Freq: Once | INTRAMUSCULAR | Status: AC | PRN
  Administered 2019-02-14: 15 mL

## 2019-02-14 MED ORDER — HYDRALAZINE HCL 20 MG/ML IJ SOLN
INTRAMUSCULAR | Status: AC
  Filled 2019-02-14: qty 1

## 2019-02-14 MED ORDER — TOBRAMYCIN SULFATE 1.2 G IJ SOLR
INTRAMUSCULAR | Status: AC | PRN
  Administered 2019-02-14: .01 g via TOPICAL

## 2019-02-14 MED ORDER — FENTANYL CITRATE (PF) 100 MCG/2ML IJ SOLN
INTRAMUSCULAR | Status: AC
  Filled 2019-02-14: qty 2

## 2019-02-14 MED ORDER — BUPIVACAINE HCL (PF) 0.5 % IJ SOLN
INTRAMUSCULAR | Status: AC | PRN
  Administered 2019-02-14: 20 mL

## 2019-02-14 MED ORDER — FENTANYL CITRATE (PF) 100 MCG/2ML IJ SOLN
INTRAMUSCULAR | Status: AC | PRN
  Administered 2019-02-14 (×3): 25 ug via INTRAVENOUS

## 2019-02-14 MED ORDER — MIDAZOLAM HCL 2 MG/2ML IJ SOLN
INTRAMUSCULAR | Status: AC | PRN
  Administered 2019-02-14: 1 mg via INTRAVENOUS

## 2019-02-14 MED ORDER — CEFAZOLIN SODIUM-DEXTROSE 2-4 GM/100ML-% IV SOLN
2.0000 g | INTRAVENOUS | Status: AC
  Administered 2019-02-14: 2 g via INTRAVENOUS

## 2019-02-14 MED ORDER — BUPIVACAINE HCL (PF) 0.5 % IJ SOLN
INTRAMUSCULAR | Status: AC
  Filled 2019-02-14: qty 30

## 2019-02-14 MED ORDER — CEFAZOLIN SODIUM-DEXTROSE 2-4 GM/100ML-% IV SOLN
INTRAVENOUS | Status: AC
  Filled 2019-02-14: qty 100

## 2019-02-14 MED ORDER — SODIUM CHLORIDE 0.9 % IV SOLN: INTRAVENOUS | Status: AC

## 2019-02-14 MED ORDER — MIDAZOLAM HCL 2 MG/2ML IJ SOLN
INTRAMUSCULAR | Status: AC
  Filled 2019-02-14: qty 2

## 2019-02-14 MED ORDER — SODIUM CHLORIDE 0.9 % IV SOLN: INTRAVENOUS | Status: DC

## 2019-02-14 NOTE — H&P (Signed)
Chief Complaint: Patient was seen in consultation today for compression fracture  Supervising Physician: Luanne Bras  Patient Status: [facility] - Out-pt  History of Present Illness: [name] [name] is a 83 y.o. female with history of anxiety, CAD, HTN, GERD, TIA who has experienced acute-on-chronic back pain for the past several weeks. She has been evaluated in the ED as well as Dr. Louanne Skye at [facility] for her back pain.   An MR Thoracic Spine 02/07/19 showed: Mild compression fracture of T9 which appears acute. There is diffuse bone marrow edema throughout the vertebral body, greater than expected for the mild fracture. This raises the possibility of pathologic fracture from metastatic disease. The patient has no known cancer. Given history of prior compression fractures, this may be a benign fracture.  Chronic compression fractures T11, T12, L1  No significant spinal stenosis or cord compression.  Patient presents for vertebroplasty/kyphoplasty of her T9 compression fracture.  She has been in her usual state of health. She denies fever, chills, cough, shortness of breath, abdominal pain, nausea, vomiting, diarrhea, dysuria.    Past Medical History:  Diagnosis Date   Anxiety    takes Xanax daily as needed   Asthma    Albuterol daily as needed   Blood transfusion without reported diagnosis    CAD (coronary artery disease)    LHC 2003 with 40% pLAD, 30% mLAD, 100% D1 (moderate vessel), 100%  D2 (small vessel), 95% mid small OM2.  Pt had PTCA to D1;  D2 and OM2 small vessels and tx medically;  Last Myoview (2012): anterior infarct seen with scar, no ischemia. EF normal. Med Rx // Myoview (12/14):  Low risk; ant scar w/ peri-infarct ischemia; EF 55% with ant AK // Myoview 5/16: EF 55-65, ant, apical scar, no ischemia    Cataract    Chronic back pain    HNP, DDD, spinal stenosis, vertebral compression fractures.    Chronic nausea    takes Zofran daily as needed.   normal gastric emptying study 03/2013   CKD (chronic kidney disease), stage III ([place]) 09/27/2017   Constipation    felt to be functional. takes Senokot and Miralax daily.  treated with Linzess in past.    DIVERTICULOSIS, COLON 08/28/2008   Qualifier: Diagnosis of  By: [name], [name], [name]     Elevated LFTs 2015   probably med induced DILI, from Augmentin vs Pravastatin   GERD (gastroesophageal reflux disease)    hx of esophageal stricture    Hiatal hernia    Paraesophageal hernias. s/p fundoplications in 0000000 and 04/2013   History of bronchitis several of yrs ago   History of colon polyps 03/2009, 03/2011   adenomatous colon polyps, no high grade dysplasia.    History of vertigo    no meds   Hyperlipidemia    takes Pravastatin daily   Hypertension    takes Amlodipine,Metoprolol,and Losartan daily   Nausea 03/2016   Osteoporosis    PONV (postoperative nausea and vomiting)    yrs ago   Slow urinary stream    takes Rapaflo daily   TIA (transient ischemic attack) 1995   no residual effects noted    Past Surgical History:  Procedure Laterality Date   APPENDECTOMY     patient unsure of date   [place]     Patient unsure of date.   CORONARY ANGIOPLASTY  11/16/2001   1 stent   ESOPHAGEAL MANOMETRY N/A 03/25/2013  Procedure: ESOPHAGEAL MANOMETRY (EM);  Surgeon: Inda Castle, MD;  Location: [facility] ENDOSCOPY;  Service: Endoscopy;  Laterality: N/A;   ESOPHAGOGASTRODUODENOSCOPY N/A 03/15/2013   Procedure: ESOPHAGOGASTRODUODENOSCOPY (EGD);  Surgeon: Jerene Bears, MD;  Location: [facility] ENDOSCOPY;  Service: Endoscopy;  Laterality: N/A;   ESOPHAGOGASTRODUODENOSCOPY N/A 12/25/2015   Procedure: ESOPHAGOGASTRODUODENOSCOPY (EGD);  Surgeon: Doran Stabler, MD;  Location: [facility] ENDOSCOPY;  Service: Endoscopy;  Laterality: N/A;   EYE SURGERY  11/2000   bilateral cataracts with lens implant   FIXATION KYPHOPLASTY LUMBAR SPINE  X 2     "L3-4"   HEMORRHOID SURGERY     HIATAL HERNIA REPAIR  06/03/2011   Procedure: LAPAROSCOPIC REPAIR OF HIATAL HERNIA;  Surgeon: Adin Hector, MD;  Location: [facility] ORS;  Service: General;  Laterality: N/A;   HIATAL HERNIA REPAIR N/A 04/24/2013   Procedure: LAPAROSCOPIC  REPAIR RECURRENT PARASOPHAGEAL HIATAL HERNIA WITH FUNDOPLICATION;  Surgeon: Adin Hector, MD;  Location: [facility] ORS;  Service: General;  Laterality: N/A;   INSERTION OF MESH N/A 04/24/2013   Procedure: INSERTION OF MESH;  Surgeon: Adin Hector, MD;  Location: [facility] ORS;  Service: General;  Laterality: N/A;   IR KYPHO THORACIC WITH BONE BIOPSY  09/29/2017   IR KYPHO THORACIC WITH BONE BIOPSY  11/03/2017   LAPAROSCOPIC LYSIS OF ADHESIONS N/A 04/24/2013   Procedure: LAPAROSCOPIC LYSIS OF ADHESIONS;  Surgeon: Adin Hector, MD;  Location: [facility] ORS;  Service: General;  Laterality: N/A;   LAPAROSCOPIC NISSEN FUNDOPLICATION  [id number]   Procedure: LAPAROSCOPIC NISSEN FUNDOPLICATION;  Surgeon: Adin Hector, MD;  Location: [facility] ORS;  Service: General;  Laterality: N/A;   LUMBAR FUSION  12/07/2015   Right L2-3 facetectomy, posterolateral fusion L2-3 with pedicle screws, rods, local bone graft, Vivigen cancellous chips. Fusion extended to the L1 level with pedicle screws and rods   LUMBAR LAMINECTOMY/DECOMPRESSION MICRODISCECTOMY Right 06/26/2012   Procedure: LUMBAR LAMINECTOMY/DECOMPRESSION MICRODISCECTOMY;  Surgeon: Jessy Oto, MD;  Location: [place];  Service: Orthopedics;  Laterality: Right;  RIGHT L4-5 MICRODISCECTOMY   LUMBAR LAMINECTOMY/DECOMPRESSION MICRODISCECTOMY N/A 05/29/2015   Procedure: RIGHT L2-3 MICRODISCECTOMY;  Surgeon: Jessy Oto, MD;  Location: [place];  Service: Orthopedics;  Laterality: N/A;   TOTAL ABDOMINAL HYSTERECTOMY  1975   partial   UPPER GASTROINTESTINAL ENDOSCOPY      Allergies: Ace inhibitors, Aspartame, Atorvastatin, Biaxin [clarithromycin], Daliresp [roflumilast], Erythromycin, and  Levofloxacin  Medications: Prior to Admission medications   Medication Sig Start Date End Date Taking? Authorizing Provider  acetaminophen (TYLENOL) 500 MG tablet Take 1 tablet (500 mg total) by mouth every 6 (six) hours as needed. 03/18/18   Bast, Tressia Miners A, NP  acetaminophen-codeine (TYLENOL #3) 300-30 MG tablet Take 1-2 tablets by mouth every 8 (eight) hours as needed for moderate pain. 01/21/19   Mcarthur Rossetti, MD  alendronate (FOSAMAX) 70 MG tablet TAKE 1 TABLET BY MOUTH ONCE A WEEK. TAKE WITH A FULL GLASS OF WATER ON AN EMPTY STOMACH. 10/09/18   [name], MD  ALPRAZolam Duanne Moron) 0.5 MG tablet Take 1/2 tablet 1- 2 x /day ONLY if needed for Anxiety Attack &  limit to 5 days /week to avoid addiction (Last till June 23rd) 08/28/18   [name], MD  amLODipine (NORVASC) 10 MG tablet Take 1 tablet Daily for BP & Heart 02/07/19   [name], MD  aspirin EC 81 MG tablet Take 81 mg by mouth at bedtime.     [provider]  Cholecalciferol (VITAMIN D3) 5000 units CAPS  Take 5,000 Units by mouth 2 (two) times daily.     [provider]  Coral Calcium 1000 (390 Ca) MG TABS Take 1,000 mg by mouth daily. 12/14/18   [name], NP  diazepam (VALIUM) 5 MG tablet Take 0.5 tablets (2.5 mg total) by mouth every 6 (six) hours as needed (spasms). 02/01/19   Mesner, Corene Cornea, MD  diclofenac sodium (VOLTAREN) 1 % GEL Apply 2 g topically 4 (four) times daily. 03/18/18   Bast, Tressia Miners A, NP  fluticasone furoate-vilanterol (BREO ELLIPTA) 100-25 MCG/INH AEPB Inhale 1 puff into the lungs daily. Rinse mouth with water after each use 06/07/18   Vicie Mutters, PA-C  gabapentin (NEURONTIN) 400 MG capsule Take 400 mg by mouth 3 (three) times daily. 09/17/18   [provider]  HYDROcodone-acetaminophen (NORCO) 7.5-325 MG tablet Take 1 tablet by mouth every 6 (six) hours as needed for moderate pain. 02/07/19   Jessy Oto, MD  ipratropium-albuterol (DUONEB) 0.5-2.5 (3) MG/3ML  SOLN Take 3 mLs by nebulization every 6 (six) hours as needed (shortness of breath/wheezing).     [provider]  ketoconazole (NIZORAL) 2 % cream  APPLY ONE APPLICATION TOPICALLY TWO TIMES DAILY 04/02/18   [name], MD  Magnesium 400 MG TABS Take 400 mg by mouth daily.    [provider]  meclizine (ANTIVERT) 25 MG tablet Take 1/2 to 1 tablet 2 to 3 x /day ONLY if needed  For Dizziness / Vertigo 06/28/18   [name], MD  methylPREDNISolone (MEDROL) 4 MG tablet Medrol dose pack. Take as instructed 01/21/19   Mcarthur Rossetti, MD  metoprolol tartrate (LOPRESSOR) 50 MG tablet Take 1 tablet 2 x /day for BP & Heart 12/23/18   [name], MD  nitroGLYCERIN (NITROSTAT) 0.4 MG SL tablet Place 1 tablet (0.4 mg total) under the tongue every 5 (five) minutes as needed for chest pain. 09/04/14   Larey Dresser, MD  omeprazole (PRILOSEC) 40 MG capsule Take 1 capsule daily  For  Heartburn, Reflux & Nausea 12/13/18   [name], NP  ondansetron (ZOFRAN) 4 MG tablet Take 1 tablet (4 mg total) by mouth every 8 (eight) hours as needed for nausea or vomiting. 05/16/18   Thurnell Lose, MD  polyethylene glycol powder (GLYCOLAX/MIRALAX) powder Take 17 g by mouth 2 (two) times daily. 05/16/17   Armbruster, Carlota Raspberry, MD  pravastatin (PRAVACHOL) 40 MG tablet Take 40 mg by mouth daily. 12/25/18   [provider]  predniSONE (DELTASONE) 20 MG tablet 3 tabs po daily x 3 days, then 2 tabs x 3 days, then 1.5 tabs x 3 days, then 1 tab x 3 days, then 0.5 tabs x 3 days 02/01/19   Mesner, Corene Cornea, MD  promethazine (PHENERGAN) 25 MG tablet TAKE 1 TABLET BY MOUTH EVERY 6 HOURS AS NEEDED FOR NAUSEA AND VOMITING 05/28/18   Vicie Mutters, PA-C  senna-docusate (SENOKOT-S) 8.6-50 MG tablet Take 2 tablets by mouth 2 (two) times daily. Patient taking differently: Take 1 tablet by mouth 2 (two) times daily.  12/26/15   Roxan Hockey, MD  sucralfate (CARAFATE) 1 g tablet Take 1 tablet 2 to  4 x /day before meals for Heartburn Patient taking differently: Take 1 g by mouth 3 (three) times daily before meals.  04/17/18   [name], MD  tamsulosin (FLOMAX) 0.4 MG CAPS capsule Take 0.4 mg by mouth daily. 03/07/18   [provider]  tiZANidine (ZANAFLEX) 4 MG tablet TAKE 1 TABLET BY MOUTH EVERY 8  HOURS AS NEEDED FOR MUSCLE SPASM 02/07/19   Mcarthur Rossetti, MD     Family History  Problem Relation Age of Onset   Colon cancer Mother 82   Heart attack Father 54       heart attack   Brain cancer Brother        tumor    Heart disease Brother    Heart disease Brother    Kidney disease Daughter    Esophageal cancer Neg Hx    Stomach cancer Neg Hx     Social History   Socioeconomic History   Marital status: Married    Spouse name: [name]   Number of children: 2   Years of education: 12   Highest education level: Not on file  Occupational History   Occupation: Retired    Fish farm manager: RETIRED    Comment: worked at [employer]: Not on file   Food insecurity    Worry: Not on file    Inability: Not on Lexicographer needs    Medical: Not on file    Non-medical: Not on file  Tobacco Use   Smoking status: Never Smoker   Smokeless tobacco: Never Used  Substance and Sexual Activity   Alcohol use: No   Drug use: Never   Sexual activity: Not on file  Lifestyle   Physical activity    Days per week: Not on file    Minutes per session: Not on file   Stress: Not on file  Relationships   Social connections    Talks on phone: Not on file    Gets together: Not on file    Attends religious service: Not on file    Active member of club or organization: Not on file    Attends meetings of clubs or organizations: Not on file    Relationship status: Not on file  Other Topics Concern   Not on file  Social History Narrative   Lives with husband.   Right-handed.   3 cups caffeine  per day.     Review of Systems: A 12 point ROS discussed and pertinent positives are indicated in the HPI above.  All other systems are negative.  Review of Systems  Constitutional: Negative for fatigue and fever.  Respiratory: Negative for cough and shortness of breath.   Cardiovascular: Negative for chest pain.  Gastrointestinal: Negative for abdominal pain.  Genitourinary: Negative for dysuria.  Musculoskeletal: Negative for back pain.  Psychiatric/Behavioral: Negative for behavioral problems and confusion.    Vital Signs: BP (!) 156/76    Pulse (!) 54    Temp 98 F (36.7 C) (Oral)    Resp 14    Ht 5\' 4"  (1.626 m)    Wt 170 lb (77.1 kg)    SpO2 100%    BMI 29.18 kg/m   Physical Exam Vitals signs and nursing note reviewed.  Constitutional:      Appearance: Normal appearance.  HENT:     Mouth/Throat:     Mouth: Mucous membranes are moist.     Pharynx: Oropharynx is clear.  Cardiovascular:     Rate and Rhythm: Normal rate and regular rhythm.  Pulmonary:     Effort: Pulmonary effort is normal. No respiratory distress.     Breath sounds: Normal breath sounds.  Abdominal:     General: Abdomen is flat.     Palpations: Abdomen is soft.  Musculoskeletal:     Comments: Point tenderness  in the mid back  Skin:    General: Skin is warm and dry.  Neurological:     General: No focal deficit present.     Mental Status: She is alert and oriented to person, place, and time. Mental status is at baseline.  Psychiatric:        Mood and Affect: Mood normal.        Behavior: Behavior normal.        Thought Content: Thought content normal.        Judgment: Judgment normal.      MD Evaluation Airway: WNL Heart: WNL Abdomen: WNL Chest/ Lungs: WNL ASA  Classification: 3 Mallampati/Airway Score: Two   Imaging: Ct Thoracic Spine Wo Contrast  Result Date: 02/01/2019 CLINICAL DATA:  Initial evaluation for acute mid to upper back pain with right lower extremity paresthesias. EXAM:  CT THORACIC AND LUMBAR SPINE WITHOUT CONTRAST TECHNIQUE: Multidetector CT imaging of the thoracic and lumbar spine was performed without contrast. Multiplanar CT image reconstructions were also generated. COMPARISON:  Prior radiograph from 01/21/2019 as well as previous MRI from 11/06/2017. FINDINGS: CT THORACIC SPINE FINDINGS Alignment: Mild straightening of the normal thoracic kyphosis. No listhesis or malalignment. Vertebrae: Chronic compression deformities involving the T11 and T12 vertebral bodies with sequelae of prior vertebral augmentation. There is an acute compression fracture involving the superior endplate of T9 with no more than mild 20% central height loss. No significant bony retropulsion or stenosis. Otherwise, vertebral body heights maintained without evidence for acute fracture. No discrete osseous lesions. Paraspinal and other soft tissues: Mild paraspinous edema noted adjacent to the T9 compression fracture. Paraspinous soft tissues demonstrate no other acute finding. Atelectatic changes noted dependently within the visualized lungs. Moderate aortic atherosclerosis. Moderate hiatal hernia. Prior cholecystectomy. Disc levels: Multilevel facet degeneration noted within the thoracic spine. No significant disc pathology seen within the upper or midthoracic spine. 4 mm bony retropulsion at T11-12 related to the chronic T12 compression fracture with resultant mild spinal stenosis. No other significant spinal stenosis within the thoracic spine. Foramina remain patent. CT LUMBAR SPINE FINDINGS Segmentation: Standard. Lowest well-formed disc labeled the L5-S1 level. Alignment: Mild levoscoliosis with apex at L2. 3 mm anterolisthesis of L4 on L5, chronic and facet mediated, and stable from previous. Focal kyphotic angulation at the thoracolumbar junction. Overall, appearance is stable. Vertebrae: Chronic compression deformities involving the T11 through L4 vertebral bodies again seen, stable. Sequelae of  prior vertebral augmentation at T11, T12, L2, L3, and L4. Overall, vertebral body height maintained without evidence for acute or interval fracture. Visualized sacrum and pelvis intact. No discrete or worrisome osseous lesions. Patient is status post posterior fusion at L1 through L3 with bilateral transpedicular screws in place. Lucency seen about the pedicular screws bilaterally at the level of L1, compatible with loosening (series 17, image 45). The right transpedicular screw at L2 lies outside the confines of the L2 vertebral body. Adjacent extruded cement material noted. Hardware itself is intact without fracture. Paraspinal and other soft tissues: Paraspinous soft tissues demonstrate no acute finding. Normal expected chronic postoperative changes noted within the posterior paraspinous musculature. Moderate aorto bi-iliac atherosclerotic disease. Prior cholecystectomy. Disc levels: L1-2: Prior posterior fusion. Bilateral facet hypertrophy with minimal disc bulge. No canal or foraminal stenosis. L2-3: Mild diffuse disc bulge. Chronic 4 mm bony retropulsion related to the chronic L3 compression fracture with flattening of the ventral thecal sac, greater on the right. Prior posterior fusion with decompressive right hemi laminectomy. Mild spinal stenosis grossly similar to  previous MRI. Moderate left L2 foraminal stenosis. Right neural foramen remains patent. L3-4: Mild diffuse disc bulge. Moderate facet and ligament flavum hypertrophy. Resultant mild spinal stenosis. Moderate bilateral L3 foraminal narrowing. L4-5: Anterolisthesis. Mild diffuse disc bulge, slightly asymmetric to the left. Advanced bilateral facet degeneration. Prior posterior decompression. Stable mild spinal stenosis. Mild bilateral L4 foraminal narrowing, greater on the left, relatively unchanged. L5-S1: Chronic intervertebral disc space narrowing with diffuse disc bulge and disc desiccation. Reactive endplate changes with marginal endplate  osteophytic spurring. Posterior decompression. Moderate facet hypertrophy. Thecal sac remains patent. Moderate bilateral L5 foraminal stenosis. IMPRESSION: CT THORACIC SPINE IMPRESSION 1. Acute compression fracture involving the superior endplate of T9 with mild 20% central height loss without bony retropulsion. 2. No other acute abnormality within the thoracic spine. 3. Chronic compression fractures involving the T11 and T12 vertebral bodies with sequelae of prior vertebral augmentation. 4 mm bony retropulsion at T12 with resultant mild spinal stenosis. CT LUMBAR SPINE IMPRESSION 1. No acute fracture or other osseous abnormality within the lumbar spine. 2. Chronic compression fractures involving the T11 through L4 vertebral bodies with sequelae of prior vertebral augmentation. 3. Prior posterior fusion at L1 through L3. Lucency about the transpedicular screws at L1 compatible with loosening. The right transpedicular screw at L2 lies outside the confines of the L2 vertebral body. 4. Multilevel lumbar spondylosis as above, not significantly changed relative to previous MRI from 2019. Resultant mild spinal stenosis at L2-3 and L3-4. Moderate foraminal narrowing on the left at L2 and bilaterally at L3 and L5. A as a gone thank in Electronically Signed   By: Jeannine Boga M.D.   On: 02/01/2019 00:18   Ct Lumbar Spine Wo Contrast  Result Date: 02/01/2019 CLINICAL DATA:  Initial evaluation for acute mid to upper back pain with right lower extremity paresthesias. EXAM: CT THORACIC AND LUMBAR SPINE WITHOUT CONTRAST TECHNIQUE: Multidetector CT imaging of the thoracic and lumbar spine was performed without contrast. Multiplanar CT image reconstructions were also generated. COMPARISON:  Prior radiograph from 01/21/2019 as well as previous MRI from 11/06/2017. FINDINGS: CT THORACIC SPINE FINDINGS Alignment: Mild straightening of the normal thoracic kyphosis. No listhesis or malalignment. Vertebrae: Chronic  compression deformities involving the T11 and T12 vertebral bodies with sequelae of prior vertebral augmentation. There is an acute compression fracture involving the superior endplate of T9 with no more than mild 20% central height loss. No significant bony retropulsion or stenosis. Otherwise, vertebral body heights maintained without evidence for acute fracture. No discrete osseous lesions. Paraspinal and other soft tissues: Mild paraspinous edema noted adjacent to the T9 compression fracture. Paraspinous soft tissues demonstrate no other acute finding. Atelectatic changes noted dependently within the visualized lungs. Moderate aortic atherosclerosis. Moderate hiatal hernia. Prior cholecystectomy. Disc levels: Multilevel facet degeneration noted within the thoracic spine. No significant disc pathology seen within the upper or midthoracic spine. 4 mm bony retropulsion at T11-12 related to the chronic T12 compression fracture with resultant mild spinal stenosis. No other significant spinal stenosis within the thoracic spine. Foramina remain patent. CT LUMBAR SPINE FINDINGS Segmentation: Standard. Lowest well-formed disc labeled the L5-S1 level. Alignment: Mild levoscoliosis with apex at L2. 3 mm anterolisthesis of L4 on L5, chronic and facet mediated, and stable from previous. Focal kyphotic angulation at the thoracolumbar junction. Overall, appearance is stable. Vertebrae: Chronic compression deformities involving the T11 through L4 vertebral bodies again seen, stable. Sequelae of prior vertebral augmentation at T11, T12, L2, L3, and L4. Overall, vertebral body height maintained without evidence  for acute or interval fracture. Visualized sacrum and pelvis intact. No discrete or worrisome osseous lesions. Patient is status post posterior fusion at L1 through L3 with bilateral transpedicular screws in place. Lucency seen about the pedicular screws bilaterally at the level of L1, compatible with loosening (series 17,  image 45). The right transpedicular screw at L2 lies outside the confines of the L2 vertebral body. Adjacent extruded cement material noted. Hardware itself is intact without fracture. Paraspinal and other soft tissues: Paraspinous soft tissues demonstrate no acute finding. Normal expected chronic postoperative changes noted within the posterior paraspinous musculature. Moderate aorto bi-iliac atherosclerotic disease. Prior cholecystectomy. Disc levels: L1-2: Prior posterior fusion. Bilateral facet hypertrophy with minimal disc bulge. No canal or foraminal stenosis. L2-3: Mild diffuse disc bulge. Chronic 4 mm bony retropulsion related to the chronic L3 compression fracture with flattening of the ventral thecal sac, greater on the right. Prior posterior fusion with decompressive right hemi laminectomy. Mild spinal stenosis grossly similar to previous MRI. Moderate left L2 foraminal stenosis. Right neural foramen remains patent. L3-4: Mild diffuse disc bulge. Moderate facet and ligament flavum hypertrophy. Resultant mild spinal stenosis. Moderate bilateral L3 foraminal narrowing. L4-5: Anterolisthesis. Mild diffuse disc bulge, slightly asymmetric to the left. Advanced bilateral facet degeneration. Prior posterior decompression. Stable mild spinal stenosis. Mild bilateral L4 foraminal narrowing, greater on the left, relatively unchanged. L5-S1: Chronic intervertebral disc space narrowing with diffuse disc bulge and disc desiccation. Reactive endplate changes with marginal endplate osteophytic spurring. Posterior decompression. Moderate facet hypertrophy. Thecal sac remains patent. Moderate bilateral L5 foraminal stenosis. IMPRESSION: CT THORACIC SPINE IMPRESSION 1. Acute compression fracture involving the superior endplate of T9 with mild 20% central height loss without bony retropulsion. 2. No other acute abnormality within the thoracic spine. 3. Chronic compression fractures involving the T11 and T12 vertebral bodies  with sequelae of prior vertebral augmentation. 4 mm bony retropulsion at T12 with resultant mild spinal stenosis. CT LUMBAR SPINE IMPRESSION 1. No acute fracture or other osseous abnormality within the lumbar spine. 2. Chronic compression fractures involving the T11 through L4 vertebral bodies with sequelae of prior vertebral augmentation. 3. Prior posterior fusion at L1 through L3. Lucency about the transpedicular screws at L1 compatible with loosening. The right transpedicular screw at L2 lies outside the confines of the L2 vertebral body. 4. Multilevel lumbar spondylosis as above, not significantly changed relative to previous MRI from 2019. Resultant mild spinal stenosis at L2-3 and L3-4. Moderate foraminal narrowing on the left at L2 and bilaterally at L3 and L5. A as a gone thank in Electronically Signed   By: Jeannine Boga M.D.   On: 02/01/2019 00:18   Mr Thoracic Spine W/o Contrast  Result Date: 02/07/2019 CLINICAL DATA:  Back pain with minor trauma EXAM: MRI THORACIC SPINE WITHOUT CONTRAST TECHNIQUE: Multiplanar, multisequence MR imaging of the thoracic spine was performed. No intravenous contrast was administered. COMPARISON:  MRI thoracic spine 11/12/2016 FINDINGS: Alignment:  Normal alignment Vertebrae: Mild compression fracture T9 vertebral body with diffuse bone marrow edema throughout the vertebral body. Mild edema in the anterior pedicle bilaterally. Posterior elements intact. This vertebral body was normal previously. Chronic compression fractures T11 and T12 with vertebroplasty. Chronic fracture L1 with pedicle screws. Cord:  Normal cord signal.  No cord compression. Paraspinal and other soft tissues: No paraspinous mass or edema. No pleural effusion. Disc levels: Negative for disc protrusion or spinal stenosis. Mild disc space narrowing at T7-8. Mild disc degeneration T11-12 and T12-L1 with mild spurring. IMPRESSION: Mild compression  fracture of T9 which appears acute. There is diffuse  bone marrow edema throughout the vertebral body, greater than expected for the mild fracture. This raises the possibility of pathologic fracture from metastatic disease. The patient has no known cancer. Given history of prior compression fractures, this may be a benign fracture. Chronic compression fractures T11, T12, L1 No significant spinal stenosis or cord compression. Electronically Signed   By: Franchot Gallo M.D.   On: 02/07/2019 18:17   Xr Thoracic Spine 2 View  Result Date: 01/21/2019 2 views of thoracic spine are difficult to interpret given the patient's osteoporosis and previous kyphoplasty's as well as hardware in the lower images which look like spinal instrumentation in the lumbar area.  It is difficult to tell but it does not look like there acute findings.  Xr Lumbar Spine 2-3 Views  Result Date: 01/21/2019 2 views of the lumbar spine show significant osteopenic bone.  There is been instrumentation in the back as well as multiple levels that have had compression deformities and kyphoplasty's.  No acute findings are seen, but the interpretation is certainly difficult given the nature of the patient's previous deformities.   Labs:  CBC: Recent Labs    06/07/18 1111 09/11/18 1319 12/13/18 1139 02/14/19 0656  WBC 8.2 7.7 8.6 10.8*  HGB 14.6 15.0 15.8* 14.2  HCT 43.1 43.9 46.7* 44.3  PLT 382 357 359 319    COAGS: Recent Labs    02/14/19 0656  INR 0.9  APTT 24    BMP: Recent Labs    05/17/18 1602 06/07/18 1111 09/11/18 1319 12/13/18 1139  NA 144 141 139 140  K 4.4 4.4 4.4 4.6  CL 104 104 103 101  CO2 26 25 25 29   GLUCOSE 96 109* 84 95  BUN 12 20 20 15   CALCIUM 10.2 10.1 10.1   10.1 10.8*  CREATININE 0.92* 0.95* 0.98* 0.84  GFRNONAA 59* 57* 54* 65  GFRAA 68 66 63 76    LIVER FUNCTION TESTS: Recent Labs    05/12/18 1025 05/13/18 1806 05/14/18 0544 05/15/18 0512 05/17/18 1602 06/07/18 1111 09/11/18 1319 12/13/18 1139  BILITOT 0.7 1.0 1.3* 1.2 0.7  0.7 0.7 0.6  AST 23 27 22 18 17 13 20 18   ALT 19 22 21 18 15 15 16 20   ALKPHOS 52 48 43 37*  --   --   --   --   PROT 7.5 7.6 6.2* 5.7* 7.0 7.3 7.5 7.8  ALBUMIN 4.1 4.2 3.4* 3.0*  --   --   --   --     TUMOR MARKERS: No results for input(s): AFPTM, CEA, CA199, CHROMGRNA in the last 8760 hours.  Assessment and Plan: Patient with past medical history of previous compression fractures s/p kyphoplasty presents with complaint of new back pain, new acute compression fracture at T9.  IR consulted for vertebroplasty/kyphoplasty at the request of Dr. Louanne Skye. Case reviewed by Dr. Estanislado Pandy who approves patient for procedure.  Patient presents today in their usual state of health.  She has been NPO and is not currently on blood thinners.  Will check UA although denies symptoms.  She does have a slightly elevated white count which may be related to inflammatory process.   Risks and benefits of kyphoplasty/vertebroplasty were discussed with the patient including, but not limited to education regarding the natural healing process of compression fractures without intervention, bleeding, infection, cement migration which may cause spinal cord damage, paralysis, pulmonary embolism or even death.  This interventional  procedure involves the use of X-rays and because of the nature of the planned procedure, it is possible that we will have prolonged use of X-ray fluoroscopy.  Potential radiation risks to you include (but are not limited to) the following: - A slightly elevated risk for cancer  several years later in life. This risk is typically less than 0.5% percent. This risk is low in comparison to the normal incidence of human cancer, which is 33% for women and 50% for men according to the [place]. - Radiation induced injury can include skin redness, resembling a rash, tissue breakdown / ulcers and hair loss (which can be temporary or permanent).   The likelihood of either of these  occurring depends on the difficulty of the procedure and whether you are sensitive to radiation due to previous procedures, disease, or genetic conditions.   IF your procedure requires a prolonged use of radiation, you will be notified and given written instructions for further action.  It is your responsibility to monitor the irradiated area for the 2 weeks following the procedure and to notify your physician if you are concerned that you have suffered a radiation induced injury.    All of the patient's questions were answered, patient is agreeable to proceed.  Consent signed and in chart.  Thank you for this interesting consult.  I greatly enjoyed meeting KEDRA TAMAS and look forward to participating in their care.  A copy of this report was sent to the requesting provider on this date.  Electronically Signed: Docia Barrier, PA 02/14/2019, 8:18 AM   I spent a total of    25 Minutes in face to face in clinical consultation, greater than 50% of which was counseling/coordinating care for T9 compression fracture.

## 2019-02-14 NOTE — Discharge Instructions (Addendum)
1.No stooping,bending or lifting more than 10 lbs for 2 weeks. 2. Use walker to ambulate for 2 weeks. 3.No driving for 2 weeks. 4.RTC PRN 2 to 3 weeks. 5.Check with referring MD for biopsyreport    Balloon Kyphoplasty, Care After This sheet gives you information about how to care for yourself after your procedure. Your health care provider may also give you more specific instructions. If you have problems or questions, contact your health care provider. What can I expect after the procedure? After your procedure, it is common to have back pain. Follow these instructions at home: Medicines  Take over-the-counter and prescription medicines only as told by your health care provider.  Ask your health care provider if the medicine prescribed to you: ? Requires you to avoid driving or using heavy machinery. ? Can cause constipation. You may need to take steps to prevent or treat constipation, such as:  Drink enough fluid to keep your urine pale yellow.  Take over-the-counter or prescription medicines.  Eat foods that are high in fiber, such as beans, whole grains, and fresh fruits and vegetables.  Limit foods that are high in fat and processed sugars, such as fried or sweet foods. Puncture site care   Follow instructions from your health care provider about how to take care of your puncture site. Make sure you: ? Wash your hands with soap and water before and after you change your bandage (dressing). If soap and water are not available, use hand sanitizer. ? Change your dressing as told by your health care provider. ? Leave skin glue or adhesive strips in place. These skin closures may need to be in place for 2 weeks or longer. If adhesive strip edges start to loosen and curl up, you may trim the loose edges. Do not remove adhesive strips completely unless your health care provider tells you to do that.  Check your puncture site every day for signs of infection. Watch for: ? Redness,  swelling, or pain. ? Fluid or blood. ? Warmth. ? Pus or a bad smell.  Keep your dressing dry until your health care provider says that it can be removed. Managing pain, stiffness, and swelling   If directed, put ice on the painful area. ? Put ice in a plastic bag. ? Place a towel between your skin and the bag. ? Leave the ice on for 20 minutes, 2-3 times a day. Activity  Rest your back and avoid intense physical activity for as long as told by your health care provider.  Avoid bending, lifting, or twisting your back for as long as told by your health care provider.  Return to your normal activities as told by your health care provider. Ask your health care provider what activities are safe for you.  Do not lift anything that is heavier than 5 lb (2.2 kg). You may need to avoid heavy lifting for several weeks. General instructions  Do not use any products that contain nicotine or tobacco, such as cigarettes, e-cigarettes, and chewing tobacco. These can delay bone healing. If you need help quitting, ask your health care provider.  Do not drive for 24 hours if you were given a sedative during your procedure.  Keep all follow-up visits as told by your health care provider. This is important. Contact a health care provider if:  You have a fever or chills.  You have redness, swelling, or pain at the site of your puncture.  You have fluid, blood, or pus coming from the  puncture site.  You have pain that gets worse or does not get better with medicine.  You develop numbness or weakness in any part of your body. Get help right away if:  You have chest pain.  You have difficulty breathing.  You have weakness, numbness, or tingling in your legs.  You cannot control your bladder or bowel movements.  You suddenly become weak or numb on one side of your body.  You become very confused.  You have trouble speaking or understanding, or both. Summary  Follow instructions from  your health care provider about how to take care of your puncture site.  Take over-the-counter and prescription medicines only as told by your health care provider.  Rest your back and avoid intense physical activity for as long as told by your health care provider.  Contact a health care provider if you have pain that gets worse or does not get better with medicine.  Keep all follow-up visits as told by your health care provider. This is important. This information is not intended to replace advice given to you by your health care provider. Make sure you discuss any questions you have with your health care provider. Document Released: 01/14/2015 Document Revised: 04/02/2018 Document Reviewed: 04/02/2018 Elsevier Patient Education  2020 [employer].

## 2019-02-14 NOTE — Procedures (Signed)
S/P T9 balloon KP with biopsy

## 2019-02-17 LAB — SURGICAL PATHOLOGY

## 2019-02-18 ENCOUNTER — Telehealth: Payer: Self-pay | Admitting: Rheumatology

## 2019-02-18 ENCOUNTER — Encounter (HOSPITAL_COMMUNITY): Payer: Self-pay | Admitting: Interventional Radiology

## 2019-02-18 NOTE — Progress Notes (Addendum)
Office Visit Note  Patient: [name] [name]             Date of Birth: [birth year]           MRN: [id number]             PCP: [name], MD Referring: [name], MD Visit Date: 02/20/2019 Occupation: @GUAROCC @  Subjective:  Discuss treatment options   History of Present Illness: [name] [name] is a 83 y.o. female with history of osteoporosis and DDD.  She presents today to discuss osteoporosis treatment options.  She has been taking Fosamax on a weekly basis but she would like to discontinue due to having difficulty swallowing the pills and the concern for esophagitis.  She states her last dose of Fosamax was on Saturday.  She continues take calcium and vitamin D.  According to the patient she continues to have recurrent vertebral fractures.  She had a kyphoplasty performed on 02/14/2019 by Dr. Luanne Bras.   She denies any joint pain or joint swelling at this time.  She denies any other concerns.  Activities of Daily Living:  Patient reports morning stiffness for 0 minutes.   Patient Reports nocturnal pain.  Difficulty dressing/grooming: Denies Difficulty climbing stairs: Denies Difficulty getting out of chair: Reports Difficulty using hands for taps, buttons, cutlery, and/or writing: Denies  Review of Systems  Constitutional: Negative for fatigue.  HENT: Negative for mouth sores, mouth dryness and nose dryness.   Eyes: Negative for itching and dryness.  Respiratory: Negative for shortness of breath, wheezing and difficulty breathing.   Cardiovascular: Negative for chest pain and palpitations.  Gastrointestinal: Negative for abdominal pain, blood in stool and diarrhea.  Endocrine: Negative for increased urination.  Genitourinary: Negative for difficulty urinating and painful urination.  Musculoskeletal: Negative for arthralgias, joint pain, joint swelling and morning stiffness.  Skin: Negative for rash and hair loss.  Allergic/Immunologic: Negative for  susceptible to infections.  Neurological: Negative for dizziness, light-headedness, numbness, headaches, memory loss and weakness.  Hematological: Positive for bruising/bleeding tendency.  Psychiatric/Behavioral: Negative for confusion and sleep disturbance.    PMFS History:  Patient Active Problem List   Diagnosis Date Noted   Nausea & vomiting 05/13/2018   Abnormal glucose 03/01/2018   FHx: heart disease 03/01/2018   Therapeutic opioid-induced constipation (OIC) 12/21/2017   Abnormal UGI series 12/21/2017   CKD (chronic kidney disease) stage 3, GFR 30-59 ml/min 09/27/2017   T12 compression fracture ([place]) 09/26/2017   Chronic bronchitis ([place]) 08/09/2017   Atherosclerosis of aorta ([place]) 04/05/2017   Anxiety state 06/08/2016   Spinal stenosis, lumbar region, with neurogenic claudication 05/29/2015   DDD (degenerative disc disease), lumbar 04/21/2015   Hyperlipidemia, mixed 11/24/2014   Gastroesophageal reflux disease 11/24/2014   Depression, major, in partial remission ([place]) 09/26/2014   Chronic pain syndrome 09/26/2014   Coronary artery disease due to lipid rich plaque    Osteoporosis 02/10/2014   Prediabetes 08/14/2013   Vitamin D deficiency 08/14/2013   MGUS (monoclonal gammopathy of unknown significance) 03/05/2013   Vertebral compression fracture ([place]) 06/23/2012   ESOPHAGEAL STRICTURE 08/28/2008   Mild intermittent asthma without complication [id number]   Incarcerated recurrent paraesophageal hiatal hernia s/p lap redo repair [id number] 01/31/2008   Essential hypertension 07/25/2007    Past Medical History:  Diagnosis Date   Anxiety    takes Xanax daily as needed   Asthma    Albuterol daily as needed   Blood transfusion without reported diagnosis    CAD (coronary artery disease)  [place] 2003 with 40% pLAD, 30% mLAD, 100% D1 (moderate vessel), 100%  D2 (small vessel), 95% mid small OM2.  Pt had PTCA to D1;  D2 and OM2 small vessels and tx  medically;  Last Myoview (2012): anterior infarct seen with scar, no ischemia. EF normal. Med Rx // Myoview (12/14):  Low risk; ant scar w/ peri-infarct ischemia; EF 55% with ant AK // Myoview 5/16: EF 55-65, ant, apical scar, no ischemia    Cataract    Chronic back pain    HNP, DDD, spinal stenosis, vertebral compression fractures.    Chronic nausea    takes Zofran daily as needed.  normal gastric emptying study 03/2013   CKD (chronic kidney disease), stage III 09/27/2017   Constipation    felt to be functional. takes Senokot and Miralax daily.  treated with Linzess in past.    DIVERTICULOSIS, COLON 08/28/2008   Qualifier: Diagnosis of  By: [name], [name], [name]     Elevated LFTs 2015   probably med induced DILI, from Augmentin vs Pravastatin   GERD (gastroesophageal reflux disease)    hx of esophageal stricture    Hiatal hernia    Paraesophageal hernias. s/p fundoplications in 0000000 and 04/2013   History of bronchitis several of yrs ago   History of colon polyps 03/2009, 03/2011   adenomatous colon polyps, no high grade dysplasia.    History of vertigo    no meds   Hyperlipidemia    takes Pravastatin daily   Hypertension    takes Amlodipine,Metoprolol,and Losartan daily   Nausea 03/2016   Osteoporosis    PONV (postoperative nausea and vomiting)    yrs ago   Slow urinary stream    takes Rapaflo daily   TIA (transient ischemic attack) 1995   no residual effects noted    Family History  Problem Relation Age of Onset   Colon cancer Mother 29   Heart attack Father 78       heart attack   Brain cancer Brother        tumor    Heart disease Brother    Heart disease Brother    Kidney disease Daughter    Esophageal cancer Neg Hx    Stomach cancer Neg Hx    Past Surgical History:  Procedure Laterality Date   APPENDECTOMY     patient unsure of date   BACK SURGERY     BLADDER REPAIR     CHOLECYSTECTOMY     Patient unsure of date.   CORONARY  ANGIOPLASTY  11/16/2001   1 stent   ESOPHAGEAL MANOMETRY N/A 03/25/2013   Procedure: ESOPHAGEAL MANOMETRY (EM);  Surgeon: Inda Castle, MD;  Location: [facility] END[facility]COPY;  Service: Endoscopy;  Laterality: N/A;   ESOPHAGOGASTRODUODENOSCOPY N/A 03/15/2013   Procedure: ESOPHAGOGASTRODUODENOSCOPY (EGD);  Surgeon: Jerene Bears, MD;  Location: [facility] ENDOSCOPY;  Service: Endoscopy;  Laterality: N/A;   ESOPHAGOGASTRODUODENOSCOPY N/A 12/25/2015   Procedure: ESOPHAGOGASTRODUODENOSCOPY (EGD);  Surgeon: Doran Stabler, MD;  Location: [facility] ENDOSCOPY;  Service: Endoscopy;  Laterality: N/A;   EYE SURGERY  11/2000   bilateral cataracts with lens implant   FIXATION KYPHOPLASTY LUMBAR SPINE  X 2   "L3-4"   HEMORRHOID SURGERY     HIATAL HERNIA REPAIR  06/03/2011   Procedure: LAPAROSCOPIC REPAIR OF HIATAL HERNIA;  Surgeon: Adin Hector, MD;  Location: [facility] ORS;  Service: General;  Laterality: N/A;   HIATAL HERNIA REPAIR N/A 04/24/2013   Procedure: LAPAROSCOPIC  REPAIR RECURRENT PARASOPHAGEAL HIATAL HERNIA WITH  FUNDOPLICATION;  Surgeon: Adin Hector, MD;  Location: [facility];  Service: General;  Laterality: N/A;   INSERTION OF MESH N/A 04/24/2013   Procedure: INSERTION OF MESH;  Surgeon: Adin Hector, MD;  Location: [facility];  Service: General;  Laterality: N/A;   IR KYPHO THORACIC WITH BONE BIOPSY  09/29/2017   IR KYPHO THORACIC WITH BONE BIOPSY  11/03/2017   IR KYPHO THORACIC WITH BONE BIOPSY  02/14/2019   LAPAROSCOPIC LYSIS OF ADHESIONS N/A 04/24/2013   Procedure: LAPAROSCOPIC LYSIS OF ADHESIONS;  Surgeon: Adin Hector, MD;  Location: [facility];  Service: General;  Laterality: N/A;   LAPAROSCOPIC NISSEN FUNDOPLICATION  [id number]   Procedure: LAPAROSCOPIC NISSEN FUNDOPLICATION;  Surgeon: Adin Hector, MD;  Location: [facility];  Service: General;  Laterality: N/A;   LUMBAR FUSION  12/07/2015   Right L2-3 facetectomy, posterolateral fusion L2-3 with pedicle screws, rods, local bone graft, Vivigen  cancellous chips. Fusion extended to the L1 level with pedicle screws and rods   LUMBAR LAMINECTOMY/DECOMPRESSION MICRODISCECTOMY Right 06/26/2012   Procedure: LUMBAR LAMINECTOMY/DECOMPRESSION MICRODISCECTOMY;  Surgeon: Jessy Oto, MD;  Location: [facility];  Service: Orthopedics;  Laterality: Right;  RIGHT L4-5 MICRODISCECTOMY   LUMBAR LAMINECTOMY/DECOMPRESSION MICRODISCECTOMY N/A 05/29/2015   Procedure: RIGHT L2-3 MICRODISCECTOMY;  Surgeon: Jessy Oto, MD;  Location: [facility];  Service: Orthopedics;  Laterality: N/A;   TOTAL ABDOMINAL HYSTERECTOMY  1975   partial   UPPER GASTROINTESTINAL ENDOSCOPY     Social History   Social History Narrative   Lives with husband.   Right-handed.   3 cups caffeine per day.   Immunization History  Administered Date(s) Administered   DT 05/06/2014   Influenza Split 02/07/2011   Influenza Whole 02/09/2009, 01/19/2010, 02/13/2012   Influenza, High Dose Seasonal PF 03/22/2013, 01/15/2014, 02/27/2015, 01/14/2016, 01/18/2017, 02/14/2018, 02/14/2019   Pneumococcal Conjugate-13 05/06/2014   Pneumococcal Polysaccharide-23 02/09/2011   Pneumococcal-Unspecified 02/06/2010, 02/09/2012   Td 05/10/2003     Objective: Vital Signs: BP 138/78 (BP Location: Left Arm, Patient Position: Sitting, Cuff Size: Normal)    Pulse 61    Resp 16    Ht 5' 1.75" (1.568 m)    Wt 175 lb (79.4 kg)    BMI 32.27 kg/m    Physical Exam Vitals signs and nursing note reviewed.  Constitutional:      Appearance: She is well-developed.  HENT:     Head: Normocephalic and atraumatic.  Eyes:     Conjunctiva/sclera: Conjunctivae normal.  Neck:     Musculoskeletal: Normal range of motion.  Cardiovascular:     Rate and Rhythm: Normal rate and regular rhythm.     Heart sounds: Normal heart sounds.  Pulmonary:     Effort: Pulmonary effort is normal.     Breath sounds: Normal breath sounds.  Abdominal:     General: Bowel sounds are normal.     Palpations: Abdomen is soft.    Lymphadenopathy:     Cervical: No cervical adenopathy.  Skin:    General: Skin is warm and dry.     Capillary Refill: Capillary refill takes less than 2 seconds.  Neurological:     Mental Status: She is alert and oriented to person, place, and time.  Psychiatric:        Behavior: Behavior normal.      Musculoskeletal Exam: C-spine good range of motion.  Thoracic and lumbar spine limited range of motion.  Shoulder joints, elbow joints, wrist joints, MCPs, PIPs, and DIPs good range of motion no  synovitis.  She has PIP and DIP synovial thickening consistent with osteoarthritis of both hands.  Hip joints have good range of motion with no discomfort.  Knee joints have good range of motion with no warmth or effusion.  She has bilateral knee crepitus.  No tenderness or swelling of ankle joints.  CDAI Exam: CDAI Score: -- Patient Global: --; Provider Global: -- Swollen: --; Tender: -- Joint Exam   No joint exam has been documented for this visit   There is currently no information documented on the homunculus. Go to the Rheumatology activity and complete the homunculus joint exam.  Investigation: No additional findings.  Imaging: Ct Thoracic Spine Wo Contrast  Result Date: 02/01/2019 CLINICAL DATA:  Initial evaluation for acute mid to upper back pain with right lower extremity paresthesias. EXAM: CT THORACIC AND LUMBAR SPINE WITHOUT CONTRAST TECHNIQUE: Multidetector CT imaging of the thoracic and lumbar spine was performed without contrast. Multiplanar CT image reconstructions were also generated. COMPARISON:  Prior radiograph from 01/21/2019 as well as previous MRI from 11/06/2017. FINDINGS: CT THORACIC SPINE FINDINGS Alignment: Mild straightening of the normal thoracic kyphosis. No listhesis or malalignment. Vertebrae: Chronic compression deformities involving the T11 and T12 vertebral bodies with sequelae of prior vertebral augmentation. There is an acute compression fracture involving the  superior endplate of T9 with no more than mild 20% central height loss. No significant bony retropulsion or stenosis. Otherwise, vertebral body heights maintained without evidence for acute fracture. No discrete osseous lesions. Paraspinal and other soft tissues: Mild paraspinous edema noted adjacent to the T9 compression fracture. Paraspinous soft tissues demonstrate no other acute finding. Atelectatic changes noted dependently within the visualized lungs. Moderate aortic atherosclerosis. Moderate hiatal hernia. Prior cholecystectomy. Disc levels: Multilevel facet degeneration noted within the thoracic spine. No significant disc pathology seen within the upper or midthoracic spine. 4 mm bony retropulsion at T11-12 related to the chronic T12 compression fracture with resultant mild spinal stenosis. No other significant spinal stenosis within the thoracic spine. Foramina remain patent. CT LUMBAR SPINE FINDINGS Segmentation: Standard. Lowest well-formed disc labeled the L5-S1 level. Alignment: Mild levoscoliosis with apex at L2. 3 mm anterolisthesis of L4 on L5, chronic and facet mediated, and stable from previous. Focal kyphotic angulation at the thoracolumbar junction. Overall, appearance is stable. Vertebrae: Chronic compression deformities involving the T11 through L4 vertebral bodies again seen, stable. Sequelae of prior vertebral augmentation at T11, T12, L2, L3, and L4. Overall, vertebral body height maintained without evidence for acute or interval fracture. Visualized sacrum and pelvis intact. No discrete or worrisome osseous lesions. Patient is status post posterior fusion at L1 through L3 with bilateral transpedicular screws in place. Lucency seen about the pedicular screws bilaterally at the level of L1, compatible with loosening (series 17, image 45). The right transpedicular screw at L2 lies outside the confines of the L2 vertebral body. Adjacent extruded cement material noted. Hardware itself is intact  without fracture. Paraspinal and other soft tissues: Paraspinous soft tissues demonstrate no acute finding. Normal expected chronic postoperative changes noted within the posterior paraspinous musculature. Moderate aorto bi-iliac atherosclerotic disease. Prior cholecystectomy. Disc levels: L1-2: Prior posterior fusion. Bilateral facet hypertrophy with minimal disc bulge. No canal or foraminal stenosis. L2-3: Mild diffuse disc bulge. Chronic 4 mm bony retropulsion related to the chronic L3 compression fracture with flattening of the ventral thecal sac, greater on the right. Prior posterior fusion with decompressive right hemi laminectomy. Mild spinal stenosis grossly similar to previous MRI. Moderate left L2 foraminal stenosis. Right  neural foramen remains patent. L3-4: Mild diffuse disc bulge. Moderate facet and ligament flavum hypertrophy. Resultant mild spinal stenosis. Moderate bilateral L3 foraminal narrowing. L4-5: Anterolisthesis. Mild diffuse disc bulge, slightly asymmetric to the left. Advanced bilateral facet degeneration. Prior posterior decompression. Stable mild spinal stenosis. Mild bilateral L4 foraminal narrowing, greater on the left, relatively unchanged. L5-S1: Chronic intervertebral disc space narrowing with diffuse disc bulge and disc desiccation. Reactive endplate changes with marginal endplate osteophytic spurring. Posterior decompression. Moderate facet hypertrophy. Thecal sac remains patent. Moderate bilateral L5 foraminal stenosis. IMPRESSION: CT THORACIC SPINE IMPRESSION 1. Acute compression fracture involving the superior endplate of T9 with mild 20% central height loss without bony retropulsion. 2. No other acute abnormality within the thoracic spine. 3. Chronic compression fractures involving the T11 and T12 vertebral bodies with sequelae of prior vertebral augmentation. 4 mm bony retropulsion at T12 with resultant mild spinal stenosis. CT LUMBAR SPINE IMPRESSION 1. No acute fracture or  other osseous abnormality within the lumbar spine. 2. Chronic compression fractures involving the T11 through L4 vertebral bodies with sequelae of prior vertebral augmentation. 3. Prior posterior fusion at L1 through L3. Lucency about the transpedicular screws at L1 compatible with loosening. The right transpedicular screw at L2 lies outside the confines of the L2 vertebral body. 4. Multilevel lumbar spondylosis as above, not significantly changed relative to previous MRI from 2019. Resultant mild spinal stenosis at L2-3 and L3-4. Moderate foraminal narrowing on the left at L2 and bilaterally at L3 and L5. A as a gone thank in Electronically Signed   By: Jeannine Boga M.D.   On: 02/01/2019 00:18   Ct Lumbar Spine Wo Contrast  Result Date: 02/01/2019 CLINICAL DATA:  Initial evaluation for acute mid to upper back pain with right lower extremity paresthesias. EXAM: CT THORACIC AND LUMBAR SPINE WITHOUT CONTRAST TECHNIQUE: Multidetector CT imaging of the thoracic and lumbar spine was performed without contrast. Multiplanar CT image reconstructions were also generated. COMPARISON:  Prior radiograph from 01/21/2019 as well as previous MRI from 11/06/2017. FINDINGS: CT THORACIC SPINE FINDINGS Alignment: Mild straightening of the normal thoracic kyphosis. No listhesis or malalignment. Vertebrae: Chronic compression deformities involving the T11 and T12 vertebral bodies with sequelae of prior vertebral augmentation. There is an acute compression fracture involving the superior endplate of T9 with no more than mild 20% central height loss. No significant bony retropulsion or stenosis. Otherwise, vertebral body heights maintained without evidence for acute fracture. No discrete osseous lesions. Paraspinal and other soft tissues: Mild paraspinous edema noted adjacent to the T9 compression fracture. Paraspinous soft tissues demonstrate no other acute finding. Atelectatic changes noted dependently within the visualized  lungs. Moderate aortic atherosclerosis. Moderate hiatal hernia. Prior cholecystectomy. Disc levels: Multilevel facet degeneration noted within the thoracic spine. No significant disc pathology seen within the upper or midthoracic spine. 4 mm bony retropulsion at T11-12 related to the chronic T12 compression fracture with resultant mild spinal stenosis. No other significant spinal stenosis within the thoracic spine. Foramina remain patent. CT LUMBAR SPINE FINDINGS Segmentation: Standard. Lowest well-formed disc labeled the L5-S1 level. Alignment: Mild levoscoliosis with apex at L2. 3 mm anterolisthesis of L4 on L5, chronic and facet mediated, and stable from previous. Focal kyphotic angulation at the thoracolumbar junction. Overall, appearance is stable. Vertebrae: Chronic compression deformities involving the T11 through L4 vertebral bodies again seen, stable. Sequelae of prior vertebral augmentation at T11, T12, L2, L3, and L4. Overall, vertebral body height maintained without evidence for acute or interval fracture. Visualized sacrum and  pelvis intact. No discrete or worrisome osseous lesions. Patient is status post posterior fusion at L1 through L3 with bilateral transpedicular screws in place. Lucency seen about the pedicular screws bilaterally at the level of L1, compatible with loosening (series 17, image 45). The right transpedicular screw at L2 lies outside the confines of the L2 vertebral body. Adjacent extruded cement material noted. Hardware itself is intact without fracture. Paraspinal and other soft tissues: Paraspinous soft tissues demonstrate no acute finding. Normal expected chronic postoperative changes noted within the posterior paraspinous musculature. Moderate aorto bi-iliac atherosclerotic disease. Prior cholecystectomy. Disc levels: L1-2: Prior posterior fusion. Bilateral facet hypertrophy with minimal disc bulge. No canal or foraminal stenosis. L2-3: Mild diffuse disc bulge. Chronic 4 mm bony  retropulsion related to the chronic L3 compression fracture with flattening of the ventral thecal sac, greater on the right. Prior posterior fusion with decompressive right hemi laminectomy. Mild spinal stenosis grossly similar to previous MRI. Moderate left L2 foraminal stenosis. Right neural foramen remains patent. L3-4: Mild diffuse disc bulge. Moderate facet and ligament flavum hypertrophy. Resultant mild spinal stenosis. Moderate bilateral L3 foraminal narrowing. L4-5: Anterolisthesis. Mild diffuse disc bulge, slightly asymmetric to the left. Advanced bilateral facet degeneration. Prior posterior decompression. Stable mild spinal stenosis. Mild bilateral L4 foraminal narrowing, greater on the left, relatively unchanged. L5-S1: Chronic intervertebral disc space narrowing with diffuse disc bulge and disc desiccation. Reactive endplate changes with marginal endplate osteophytic spurring. Posterior decompression. Moderate facet hypertrophy. Thecal sac remains patent. Moderate bilateral L5 foraminal stenosis. IMPRESSION: CT THORACIC SPINE IMPRESSION 1. Acute compression fracture involving the superior endplate of T9 with mild 20% central height loss without bony retropulsion. 2. No other acute abnormality within the thoracic spine. 3. Chronic compression fractures involving the T11 and T12 vertebral bodies with sequelae of prior vertebral augmentation. 4 mm bony retropulsion at T12 with resultant mild spinal stenosis. CT LUMBAR SPINE IMPRESSION 1. No acute fracture or other osseous abnormality within the lumbar spine. 2. Chronic compression fractures involving the T11 through L4 vertebral bodies with sequelae of prior vertebral augmentation. 3. Prior posterior fusion at L1 through L3. Lucency about the transpedicular screws at L1 compatible with loosening. The right transpedicular screw at L2 lies outside the confines of the L2 vertebral body. 4. Multilevel lumbar spondylosis as above, not significantly changed  relative to previous MRI from 2019. Resultant mild spinal stenosis at L2-3 and L3-4. Moderate foraminal narrowing on the left at L2 and bilaterally at L3 and L5. A as a gone thank in Electronically Signed   By: Jeannine Boga M.D.   On: 02/01/2019 00:18   Mr Thoracic Spine W/o Contrast  Result Date: 02/07/2019 CLINICAL DATA:  Back pain with minor trauma EXAM: MRI THORACIC SPINE WITHOUT CONTRAST TECHNIQUE: Multiplanar, multisequence MR imaging of the thoracic spine was performed. No intravenous contrast was administered. COMPARISON:  MRI thoracic spine 11/12/2016 FINDINGS: Alignment:  Normal alignment Vertebrae: Mild compression fracture T9 vertebral body with diffuse bone marrow edema throughout the vertebral body. Mild edema in the anterior pedicle bilaterally. Posterior elements intact. This vertebral body was normal previously. Chronic compression fractures T11 and T12 with vertebroplasty. Chronic fracture L1 with pedicle screws. Cord:  Normal cord signal.  No cord compression. Paraspinal and other soft tissues: No paraspinous mass or edema. No pleural effusion. Disc levels: Negative for disc protrusion or spinal stenosis. Mild disc space narrowing at T7-8. Mild disc degeneration T11-12 and T12-L1 with mild spurring. IMPRESSION: Mild compression fracture of T9 which appears acute. There is  diffuse bone marrow edema throughout the vertebral body, greater than expected for the mild fracture. This raises the possibility of pathologic fracture from metastatic disease. The patient has no known cancer. Given history of prior compression fractures, this may be a benign fracture. Chronic compression fractures T11, T12, L1 No significant spinal stenosis or cord compression. Electronically Signed   By: Franchot Gallo M.D.   On: 02/07/2019 18:17   Ir Kypho Thoracic With Bone Biopsy  Result Date: 02/18/2019 INDICATION: Severe low back pain secondary to compression fracture at T9. EXAM: BALLOON KYPHOPLASTY AT  T9 COMPARISON:  MRI thoracic spine of February 07, 2019. MEDICATIONS: As antibiotic prophylaxis, 2 g Ancef was ordered pre-procedure and administered intravenously within 1 hour of incision. ANESTHESIA/SEDATION: Moderate (conscious) sedation was employed during this procedure. A total of Versed 1 mg and Fentanyl 75 mcg was administered intravenously. Moderate Sedation Time: 25 minutes. The patient's level of consciousness and vital signs were monitored continuously by radiology nursing throughout the procedure under my direct supervision. FLUOROSCOPY TIME:  Fluoroscopy Time: 10 minutes 48 seconds ([id number]) COMPLICATIONS: None immediate. PROCEDURE: Following a full explanation of the procedure along with the potential associated complications, an informed witnessed consent was obtained. The patient was placed prone on the fluoroscopic table. The skin overlying the thoracolumbar region was then prepped and draped in the usual sterile fashion. The right pedicle at T9 was then infiltrated with 0.25% bupivacaine followed by the advancement of an 11-gauge Jamshidi needle through the right pedicle into the posterior one-third at T9. This was then exchanged for a Kyphon advanced osteo introducer system comprised of a working cannula and a Kyphon osteo drill. This combination was then advanced over a Kyphon osteo bone pin until the tip of the Kyphon osteo drill was in the posterior third at T9. At this time, the bone pin was removed. In a medial trajectory, the combination was advanced until the tip of the working cannula was inside the posterior one-third at T9. The osteo drill was removed and a core sample sent for pathologic analysis. Through the working cannula, a Kyphon bone biopsy device was advanced to within 5 mm of the anterior aspect of T9. A core sample from this was also sent for pathologic analysis. Through the working cannula, a Kyphon inflatable bone tamp 20 x 3 was advanced and positioned with the distal marker  5 mm from the anterior aspect of T9. Crossing of the midline was seen on the AP projection. At this time, the balloon was expanded using contrast via a Kyphon inflation syringe device via microtubing. Inflations were continued until there was apposition with the superior endplate. At this time, methylmethacrylate mixture was reconstituted with Tobramycin in the Kyphon bone mixing device system. This was then loaded onto the Kyphon bone fillers. The balloon was deflated and removed followed by the instillation of 2 and half bone filler equivalents of methylmethacrylate mixture at T9 with excellent filling in the AP and lateral projections. No extravasation was noted in the disk spaces or posteriorly into the spinal canal. No epidural venous contamination was seen. The working cannula and the bone filler were then retrieved and removed. Hemostasis was achieved at the skin entry site. IMPRESSION: 1. Status post fluoroscopic-guided needle placement for deep core bone biopsy at T9. 2. Status post vertebral body augmentation using balloon kyphoplasty at T9 as described without event. 3. Patient was advised to call Dr Otho Ket office in the next 48 hours for the biopsy results. Electronically Signed   By:  Luanne Bras M.D.   On: 02/14/2019 11:37    Recent Labs: Lab Results  Component Value Date   WBC 10.8 (H) 02/14/2019   HGB 14.2 02/14/2019   PLT 319 02/14/2019   NA 140 12/13/2018   K 4.6 12/13/2018   CL 101 12/13/2018   CO2 29 12/13/2018   GLUCOSE 95 12/13/2018   BUN 15 12/13/2018   CREATININE 0.84 12/13/2018   BILITOT 0.6 12/13/2018   ALKPHOS 37 (L) 05/15/2018   AST 18 12/13/2018   ALT 20 12/13/2018   PROT 7.8 12/13/2018   ALBUMIN 3.0 (L) 05/15/2018   CALCIUM 10.8 (H) 12/13/2018   GFRAA 76 12/13/2018    Speciality Comments: No specialty comments available.  Procedures:  No procedures performed Allergies: Ace inhibitors, Aspartame, Atorvastatin, Biaxin [clarithromycin], Daliresp  [roflumilast], Erythromycin, and Levofloxacin   Assessment / Plan:     Visit Diagnoses: Age-related osteoporosis without current pathological fracture -DEXA 11/12/18: The BMD measured at Femur Neck Left is 0.657 g/cm2 with a T-score of -2.7.There has been a statistically significant decrease in BMD of Total Mean since prior exam dated 07/08/2016.  We discussed the recent DEXA results.  She has been taking Fosamax 70 mg 1 tablet by mouth once weekly.  She would like to discontinue Fosamax due to having difficulty swallowing the pills and the concern for esophagitis.  She continues to take calcium and vitamin D as recommended.  She continues to have recurrent vertebral compression fractures.  She had a T9 kyphoplasty performed on 02/14/2019 performed by Dr. Luanne Bras.  We discussed different treatment options including IV Reclast, Prolia sq injections, and Tymlos/Forteo daily injections.  She would like to proceed with Forteo sq daily injections.  Indications, contraindications, potential side effects of Forteo were discussed. We will notify her once [place] has been approved.  History of vitamin D deficiency: She is taking a vitamin D supplement as recommended.  History of vertebral compression fracture: Recurrent-She had a T9 kyphoplasty performed on 02/14/19.    Primary osteoarthritis of both knees: She has good range of motion with no discomfort.  She has bilateral knee crepitus.  No warmth or effusion was noted.  She has no discomfort at this time.  She is currently walking with a Rollator walker to assist with ambulation.  DDD (degenerative disc disease), lumbar - Followed by Dr. Louanne Skye  Facet arthropathy  H/O kyphoplasty: S/p T9 kyphoplasty with biopsy on 02/14/19.   Other medical conditions are listed as follows:   History of gastroesophageal reflux (GERD)  History of hypercholesterolemia  History of asthma  History of appendectomy  History of cholecystectomy  History of  hypertension  History of neuropathy  History of anxiety  Orders: No orders of the defined types were placed in this encounter.  No orders of the defined types were placed in this encounter.   Face-to-face time spent with patient was 30 minutes. Greater than 50% of time was spent in counseling and coordination of care.  Follow-Up Instructions: Return for Osteoporosis, DDD.   Ofilia Neas, PA-C  Note - This record has been created using Dragon software.  Chart creation errors have been sought, but may not always  have been located. Such creation errors do not reflect on  the standard of medical care.

## 2019-02-18 NOTE — Telephone Encounter (Signed)
Please advise 

## 2019-02-18 NOTE — Telephone Encounter (Signed)
Patient called stating she has been having a difficult time swallowing her Fosamax medication.  Patient states "she is worried that the pill might get stuck and burn a hole in her esophagus."  Patient states her PCP told her to contact Dr. Estanislado Pandy.  Patient states at her last appointment, Dr. Estanislado Pandy recommended an injection and is requesting a return call.

## 2019-02-18 NOTE — Telephone Encounter (Signed)
Patient is scheduled for an appointment to discuss osteoporosis treatment options on 02/20/2019.

## 2019-02-20 ENCOUNTER — Telehealth: Payer: Self-pay | Admitting: Pharmacist

## 2019-02-20 ENCOUNTER — Ambulatory Visit (INDEPENDENT_AMBULATORY_CARE_PROVIDER_SITE_OTHER): Payer: PPO | Admitting: Physician Assistant

## 2019-02-20 ENCOUNTER — Encounter: Payer: Self-pay | Admitting: Physician Assistant

## 2019-02-20 ENCOUNTER — Ambulatory Visit: Payer: PPO | Admitting: Specialist

## 2019-02-20 ENCOUNTER — Other Ambulatory Visit: Payer: Self-pay | Admitting: Orthopaedic Surgery

## 2019-02-20 ENCOUNTER — Other Ambulatory Visit: Payer: Self-pay

## 2019-02-20 VITALS — BP 138/78 | HR 61 | Resp 16 | Ht 61.75 in | Wt 175.0 lb

## 2019-02-20 DIAGNOSIS — Z9049 Acquired absence of other specified parts of digestive tract: Secondary | ICD-10-CM

## 2019-02-20 DIAGNOSIS — Z8659 Personal history of other mental and behavioral disorders: Secondary | ICD-10-CM

## 2019-02-20 DIAGNOSIS — Z8669 Personal history of other diseases of the nervous system and sense organs: Secondary | ICD-10-CM | POA: Diagnosis not present

## 2019-02-20 DIAGNOSIS — Z8679 Personal history of other diseases of the circulatory system: Secondary | ICD-10-CM | POA: Diagnosis not present

## 2019-02-20 DIAGNOSIS — Z8709 Personal history of other diseases of the respiratory system: Secondary | ICD-10-CM | POA: Diagnosis not present

## 2019-02-20 DIAGNOSIS — Z8719 Personal history of other diseases of the digestive system: Secondary | ICD-10-CM

## 2019-02-20 DIAGNOSIS — M810 Age-related osteoporosis without current pathological fracture: Secondary | ICD-10-CM

## 2019-02-20 DIAGNOSIS — M51369 Other intervertebral disc degeneration, lumbar region without mention of lumbar back pain or lower extremity pain: Secondary | ICD-10-CM

## 2019-02-20 DIAGNOSIS — Z9889 Other specified postprocedural states: Secondary | ICD-10-CM

## 2019-02-20 DIAGNOSIS — M5136 Other intervertebral disc degeneration, lumbar region: Secondary | ICD-10-CM

## 2019-02-20 DIAGNOSIS — Z8639 Personal history of other endocrine, nutritional and metabolic disease: Secondary | ICD-10-CM

## 2019-02-20 DIAGNOSIS — M170 Bilateral primary osteoarthritis of knee: Secondary | ICD-10-CM

## 2019-02-20 DIAGNOSIS — M47819 Spondylosis without myelopathy or radiculopathy, site unspecified: Secondary | ICD-10-CM | POA: Diagnosis not present

## 2019-02-20 DIAGNOSIS — Z8781 Personal history of (healed) traumatic fracture: Secondary | ICD-10-CM

## 2019-02-20 NOTE — Telephone Encounter (Signed)
Forteo is non-formulary on the patient's plan. Plan prefers Tymlos.  Submitted a Prior Authorization request to [employer] for TYMLOS via Cover My Meds. Will update once we receive a response.   Ran test claim for Prolia. Patient's copay is $180 for 1 syringe.  2:16 PM Beatriz Chancellor, CPhT

## 2019-02-20 NOTE — Telephone Encounter (Signed)
Received notification from [employer] regarding a prior authorization for TYMLOS. Authorization has been APPROVED from 02/20/2019 to 02/20/2020.   Will send document to scan center.  Authorization # [id number]   Ran test claim, patient's copay for 1 month is $607.48. Claim put patient into the coverage gap.  3:10 PM Beatriz Chancellor, CPhT

## 2019-02-20 NOTE — Telephone Encounter (Signed)
Please advise 

## 2019-02-20 NOTE — Telephone Encounter (Signed)
Patient has not decided on Prolia/Forteo but would like to start BIV for both.  She has a history of vertebral compression fracture.  She was unable to tolerate Fosamax due to GI effects.  Last DEXA on 11/12/2018 showed T score of -2.7 at left femur neck with statistically significant decrease in BMD.

## 2019-02-20 NOTE — Progress Notes (Signed)
Pharmacy Note   Subjective: Patient presents today to the [facility] to see Dr. Estanislado Pandy.  She was seen by the pharmacist for counseling on Prolia/Forteo.  She has a history of vertebral fracture.  She was previously on Fosmax but discontinued due to GI effects.   Objective: CMP     Component Value Date/Time   NA 140 12/13/2018 1139   NA 139 02/19/2013 1346   K 4.6 12/13/2018 1139   K 2.8 (LL) 02/19/2013 1346   CL 101 12/13/2018 1139   CO2 29 12/13/2018 1139   CO2 24 02/19/2013 1346   GLUCOSE 95 12/13/2018 1139   GLUCOSE 100 02/19/2013 1346   BUN 15 12/13/2018 1139   BUN 12.6 02/19/2013 1346   CREATININE 0.84 12/13/2018 1139   CREATININE 1.0 02/19/2013 1346   CALCIUM 10.8 (H) 12/13/2018 1139   CALCIUM 10.0 02/19/2013 1346   PROT 7.8 12/13/2018 1139   PROT 8.0 02/19/2013 1346   ALBUMIN 3.0 (L) 05/15/2018 0512   ALBUMIN 3.9 02/19/2013 1346   AST 18 12/13/2018 1139   AST 12 02/19/2013 1346   ALT 20 12/13/2018 1139   ALT 11 02/19/2013 1346   ALKPHOS 37 (L) 05/15/2018 0512   ALKPHOS 87 02/19/2013 1346   BILITOT 0.6 12/13/2018 1139   BILITOT 0.74 02/19/2013 1346   GFRNONAA 65 12/13/2018 1139   GFRAA 76 12/13/2018 1139    Baseline Lab Monitoring Lab Results  Component Value Date   VD25OH 56 09/11/2018   Lab Results  Component Value Date   PTH 22 09/11/2018   CALCIUM 10.8 (H) 12/13/2018   CAION 1.10 (L) 02/16/2010   PHOS 1.6 (L) 12/10/2015    Lab Results  Component Value Date   TSH 0.69 12/13/2018     Assessment/Plan:  Had detailed discussion of osteoporosis and goals of therapy.  Counseled patient on the purpose, proper use, and adverse effects of Forteo  Discussed maximum use of parathyroid hormone analog of two years and then patient would need to start another osteoporosis medication. Patient voiced understanding.  Patient denies any questions or concerns at this time.    Counseled patient on purpose, proper use, and adverse effects of Prolia.   Counseled patient that Prolia is a medication that must be injected every 6 months by a healthcare professional.  Advised patient to take calcium 1200 mg daily and vitamin D 800 units daily.  Reviewed the most common adverse effects of Prolia including risk of infection, osteonecrosis of the jaw, rash, and muscle/bone pain.  Patient confirms she does not have any major dental work planned at this time.  Reviewed with patient the signs/symptoms of low calcium and advised patient to alert [place] if she experiences these symptoms.  Provided patient with medication education material and answered all questions.  Will apply for Forteo/Prolia through patient's insurance and update when we receive a response.  They will decide which therapy the want pending insurance information and after reviewing medications in depth.  If patient chooses [place] will schedule an appointment to review injection technique.  All questions encouraged and answered.  Instructed patient to call with any other questions or concerns.   Mariella Saa, PharmD, [place], [place] Clinical Specialty Pharmacist [phone]  02/20/2019 12:43 PM

## 2019-02-21 ENCOUNTER — Other Ambulatory Visit: Payer: Self-pay | Admitting: Specialist

## 2019-02-21 MED ORDER — HYDROCODONE-ACETAMINOPHEN 7.5-325 MG PO TABS: 1.0000 | ORAL_TABLET | Freq: Four times a day (QID) | ORAL | 0 refills | Status: DC | PRN

## 2019-02-21 NOTE — Telephone Encounter (Signed)
Patient called needing Rx refilled (Hydrocodone) The number to contact patient is [phone]

## 2019-02-25 NOTE — Telephone Encounter (Signed)
Patient called and notified.  She wants to proceed with Tymlos and apply for patient assistance.  Scheduled appointment for 10/21 at 10AM for injection training.   Mariella Saa, PharmD, [place], [place] Clinical Specialty Pharmacist [phone]  02/25/2019 1:59 PM

## 2019-02-26 DIAGNOSIS — H61002 Unspecified perichondritis of left external ear: Secondary | ICD-10-CM | POA: Diagnosis not present

## 2019-02-26 DIAGNOSIS — D485 Neoplasm of uncertain behavior of skin: Secondary | ICD-10-CM | POA: Diagnosis not present

## 2019-02-26 DIAGNOSIS — H61009 Unspecified perichondritis of external ear, unspecified ear: Secondary | ICD-10-CM | POA: Diagnosis not present

## 2019-02-27 ENCOUNTER — Other Ambulatory Visit: Payer: Self-pay

## 2019-02-27 ENCOUNTER — Ambulatory Visit (INDEPENDENT_AMBULATORY_CARE_PROVIDER_SITE_OTHER): Payer: PPO | Admitting: Pharmacist

## 2019-02-27 DIAGNOSIS — M810 Age-related osteoporosis without current pathological fracture: Secondary | ICD-10-CM

## 2019-02-27 NOTE — Progress Notes (Signed)
Pharmacy Note  Subjective:  Patient presents today to the [facility] to see Dr. Estanislado Pandy.   Patient seen by the pharmacist for counseling on Prolia and Tymlos.  She was accompanied by her husband [name].   Objective: Current Outpatient Medications on File Prior to Visit  Medication Sig Dispense Refill  . acetaminophen (TYLENOL) 500 MG tablet Take 1 tablet (500 mg total) by mouth every 6 (six) hours as needed. 30 tablet 0  . acetaminophen-codeine (TYLENOL #3) 300-30 MG tablet Take 1-2 tablets by mouth every 8 (eight) hours as needed for moderate pain. 30 tablet 0  . ALPRAZolam (XANAX) 0.5 MG tablet Take 1/2 tablet 1- 2 x /day ONLY if needed for Anxiety Attack &  limit to 5 days /week to avoid addiction (Last till June 23rd) 90 tablet 0  . amLODipine (NORVASC) 10 MG tablet Take 1 tablet Daily for BP & Heart 90 tablet 3  . aspirin EC 81 MG tablet Take 81 mg by mouth at bedtime.     . Cholecalciferol (VITAMIN D3) 5000 units CAPS Take 5,000 Units by mouth 2 (two) times daily.     [name] Coral Calcium 1000 (390 Ca) MG TABS Take 1,000 mg by mouth daily. 60 tablet   . diclofenac sodium (VOLTAREN) 1 % GEL Apply 2 g topically 4 (four) times daily. 1 Tube 1  . fluticasone furoate-vilanterol (BREO ELLIPTA) 100-25 MCG/INH AEPB Inhale 1 puff into the lungs daily. Rinse mouth with water after each use 1 each 3  . gabapentin (NEURONTIN) 400 MG capsule Take 400 mg by mouth 3 (three) times daily.    [name] HYDROcodone-acetaminophen (NORCO) 7.5-325 MG tablet Take 1 tablet by mouth every 6 (six) hours as needed for moderate pain. 30 tablet 0  . ipratropium-albuterol (DUONEB) 0.5-2.5 (3) MG/3ML SOLN Take 3 mLs by nebulization every 6 (six) hours as needed (shortness of breath/wheezing).     [name] ketoconazole (NIZORAL) 2 % cream  APPLY ONE APPLICATION TOPICALLY TWO TIMES DAILY 30 g 1  . Magnesium 400 MG TABS Take 400 mg by mouth daily.    . meclizine (ANTIVERT) 25 MG tablet Take 1/2 to 1 tablet 2 to 3 x /day ONLY  if needed  For Dizziness / Vertigo 270 tablet 1  . metoprolol tartrate (LOPRESSOR) 50 MG tablet Take 1 tablet 2 x /day for BP & Heart 180 tablet 3  . nitroGLYCERIN (NITROSTAT) 0.4 MG SL tablet Place 1 tablet (0.4 mg total) under the tongue every 5 (five) minutes as needed for chest pain. 90 tablet 3  . omeprazole (PRILOSEC) 40 MG capsule Take 1 capsule daily  For  Heartburn, Reflux & Nausea 90 capsule 1  . ondansetron (ZOFRAN) 4 MG tablet Take 1 tablet (4 mg total) by mouth every 8 (eight) hours as needed for nausea or vomiting. 20 tablet 0  . polyethylene glycol powder (GLYCOLAX/MIRALAX) powder Take 17 g by mouth 2 (two) times daily. 527 g 0  . pravastatin (PRAVACHOL) 40 MG tablet Take 40 mg by mouth daily.    . promethazine (PHENERGAN) 25 MG tablet TAKE 1 TABLET BY MOUTH EVERY 6 HOURS AS NEEDED FOR NAUSEA AND VOMITING 100 tablet 0  . senna-docusate (SENOKOT-S) 8.6-50 MG tablet Take 2 tablets by mouth 2 (two) times daily. (Patient taking differently: Take 1 tablet by mouth 2 (two) times daily. ) 60 tablet 1  . sucralfate (CARAFATE) 1 g tablet Take 1 tablet 2 to 4 x /day before meals for Heartburn (Patient taking differently: Take 1 g  by mouth 3 (three) times daily before meals. ) 360 tablet 1  . tiZANidine (ZANAFLEX) 4 MG tablet TAKE 1 TABLET BY MOUTH EVERY 8 HOURS AS NEEDED FOR MUSCLE SPASM 30 tablet 0   Current Facility-Administered Medications on File Prior to Visit  Medication Dose Route Frequency Provider Last Rate Last Dose  . calcitonin (salmon) (MIACALCIN/FORTICAL) nasal spray 1 spray  1 spray Alternating Nares Daily Jessy Oto, MD         Assessment/Plan:  Discussed treatment options Tymlos and Prolia in depth.  Prolia is approved through insurance for $180 co-pay.  Tymlos is approved through insurance with a [phone] for 1 month supply.  Patient household size 2 with yearly income of $27,444.  Patient should qualify for [employer] patient assistance for Tymlos.  She should also be  eligible for [employer] Post-menopausal Osteoporosis grant.  She has attempted to start Tymlos in the past and was given a sample.  She did not start due to not wanting to inject daily.  She is still not a fan of injecting daily but wants to do what our office recommends as the best options.  Advised patient that Dr. Estanislado Pandy prefers Tymlos but if she is not willing to inject daily we are okay with proceeding with Prolia.  They are also concerned about cost as they can not afford $600 every month and that she would not be approved for patient assistance.  She prefers to proceed with Prolia at this time.  Patient was approved for [employer].  Will send prescription to [facility] along with grant information pending labs.  Prolia will be couriered to the office once appointment has been made.  Pharmacy ID: [id number] BIN: [id number]  She filled out paperwork for [employer] patient assistance for Tymlos.  Will fax and update when we receive a response.  All questions encouraged and answered.  Instructed patient to call with any other questions or concerns.  Mariella Saa, PharmD, [place], [place] Clinical Specialty Pharmacist [phone]  02/27/2019 11:04 AM

## 2019-02-27 NOTE — Patient Instructions (Signed)
We will schedule an appointment for Prolia injection once we receive your lab results.  You will need to return to the office for labs 10 days after your injection.

## 2019-02-28 ENCOUNTER — Ambulatory Visit (INDEPENDENT_AMBULATORY_CARE_PROVIDER_SITE_OTHER): Payer: PPO

## 2019-02-28 ENCOUNTER — Ambulatory Visit (INDEPENDENT_AMBULATORY_CARE_PROVIDER_SITE_OTHER): Payer: PPO | Admitting: Specialist

## 2019-02-28 ENCOUNTER — Other Ambulatory Visit: Payer: Self-pay

## 2019-02-28 ENCOUNTER — Encounter: Payer: Self-pay | Admitting: Specialist

## 2019-02-28 VITALS — BP 140/73 | HR 91 | Ht 61.75 in | Wt 175.0 lb

## 2019-02-28 DIAGNOSIS — S22070A Wedge compression fracture of T9-T10 vertebra, initial encounter for closed fracture: Secondary | ICD-10-CM | POA: Diagnosis not present

## 2019-02-28 DIAGNOSIS — M546 Pain in thoracic spine: Secondary | ICD-10-CM

## 2019-02-28 DIAGNOSIS — R0781 Pleurodynia: Secondary | ICD-10-CM

## 2019-02-28 DIAGNOSIS — S22070G Wedge compression fracture of T9-T10 vertebra, subsequent encounter for fracture with delayed healing: Secondary | ICD-10-CM | POA: Diagnosis not present

## 2019-02-28 LAB — CBC WITH DIFFERENTIAL/PLATELET
Absolute Monocytes: 775 cells/uL (ref 200–950)
Basophils Absolute: 38 cells/uL (ref 0–200)
Basophils Relative: 0.5 %
Eosinophils Absolute: 152 cells/uL (ref 15–500)
Eosinophils Relative: 2 %
HCT: 42.6 % (ref 35.0–45.0)
Hemoglobin: 14.4 g/dL (ref 11.7–15.5)
Lymphs Abs: 1558 cells/uL (ref 850–3900)
MCH: 31.9 pg (ref 27.0–33.0)
MCHC: 33.8 g/dL (ref 32.0–36.0)
MCV: 94.5 fL (ref 80.0–100.0)
MPV: 10.9 fL (ref 7.5–12.5)
Monocytes Relative: 10.2 %
Neutro Abs: 5077 cells/uL (ref 1500–7800)
Neutrophils Relative %: 66.8 %
Platelets: 338 10*3/uL (ref 140–400)
RBC: 4.51 10*6/uL (ref 3.80–5.10)
RDW: 12.5 % (ref 11.0–15.0)
Total Lymphocyte: 20.5 %
WBC: 7.6 10*3/uL (ref 3.8–10.8)

## 2019-02-28 LAB — COMPLETE METABOLIC PANEL WITH GFR
AG Ratio: 1.4 (calc) (ref 1.0–2.5)
ALT: 20 U/L (ref 6–29)
AST: 19 U/L (ref 10–35)
Albumin: 3.9 g/dL (ref 3.6–5.1)
Alkaline phosphatase (APISO): 80 U/L (ref 37–153)
BUN/Creatinine Ratio: 19 (calc) (ref 6–22)
BUN: 18 mg/dL (ref 7–25)
CO2: 28 mmol/L (ref 20–32)
Calcium: 9.3 mg/dL (ref 8.6–10.4)
Chloride: 103 mmol/L (ref 98–110)
Creat: 0.94 mg/dL — ABNORMAL HIGH (ref 0.60–0.88)
GFR, Est African American: 66 mL/min/{1.73_m2} (ref >=60)
GFR, Est Non African American: 57 mL/min/{1.73_m2} — ABNORMAL LOW (ref >=60)
Globulin: 2.8 g/dL (calc) (ref 1.9–3.7)
Glucose, Bld: 99 mg/dL (ref 65–139)
Potassium: 4.4 mmol/L (ref 3.5–5.3)
Sodium: 141 mmol/L (ref 135–146)
Total Bilirubin: 0.5 mg/dL (ref 0.2–1.2)
Total Protein: 6.7 g/dL (ref 6.1–8.1)

## 2019-02-28 MED ORDER — HYDROCODONE-ACETAMINOPHEN 7.5-325 MG PO TABS: 1.0000 | ORAL_TABLET | Freq: Four times a day (QID) | ORAL | 0 refills | Status: DC | PRN

## 2019-02-28 NOTE — Patient Instructions (Signed)
Avoid frequent bending and stooping  No lifting greater than 10 lbs. May use ice or moist heat for pain. Weight loss is of benefit. Hydrocodone for pain. Obtain a 3 point CASH brace from [facility] Exercise is important to improve your indurance and does allow people to function better inspite of back pain. Continue Vitamin D supplements and Calcium. Continue with meds to improve bone strength.

## 2019-02-28 NOTE — Progress Notes (Signed)
Office Visit Note   Patient: [name] [name]           Date of Birth: 1936-01-09           MRN: [id number] Visit Date: 02/28/2019              Requested by: [name], [address] [place],  [place] [address] PCP: [name], MD   Assessment & Plan: Visit Diagnoses: No diagnosis found.  Plan:Avoid frequent bending and stooping  No lifting greater than 10 lbs. May use ice or moist heat for pain. Weight loss is of benefit. Hydrocodone for pain. Obtain a 3 point CASH brace from [facility] Exercise is important to improve your indurance and does allow people to function better inspite of back pain. Continue Vitamin D supplements and Calcium. Continue with meds to improve bone strength.   Follow-Up Instructions: No follow-ups on file.   Orders:  No orders of the defined types were placed in this encounter.  No orders of the defined types were placed in this encounter.     Procedures: No procedures performed   Clinical Data: No additional findings.   Subjective: Chief Complaint  Patient presents with  . MRI Review    83 year old female with history of osteoporosis with multiple thoracolumbar compression fractures. She has been on IV biphosphonates to improve her density but unfortunately she had a new compression deformity occur at the T9 level after trying to assist her husband painting a door sill. There is some right ankle numbness laterally and she describes this being worse since the kyphoplasty/vertebroplasty done by Dr. Estanislado Pandy 02/14/2019. Since then she reports right flank and right upper buttock pain that is worse with lying down and and bending and sitting sometimes. No bowel or bladder difficulties, she is to see Dr. Jeffie Pollock Monday AM due to the need to use pads. The pain is a "7" on scale 1-10 but is as high as an "8-9" and she has to go lie down. She feels weak in her legs. Sometimes though she feels okay.    Review of Systems   Constitutional: Negative.  Negative for activity change, appetite change, chills, diaphoresis, fatigue, fever and unexpected weight change.  HENT: Positive for trouble swallowing. Negative for congestion, dental problem, drooling, ear discharge, ear pain, facial swelling, hearing loss, mouth sores, nosebleeds, postnasal drip, rhinorrhea, sinus pressure, sinus pain, sneezing, sore throat, tinnitus and voice change.   Eyes: Negative.  Negative for photophobia, pain, discharge, redness, itching and visual disturbance.  Respiratory: Negative.  Negative for apnea, cough, choking, chest tightness, shortness of breath, wheezing and stridor.   Cardiovascular: Positive for leg swelling. Negative for chest pain and palpitations.  Gastrointestinal: Negative.  Negative for abdominal distention, abdominal pain, anal bleeding, blood in stool, constipation, diarrhea, nausea, rectal pain and vomiting.  Genitourinary: Negative for difficulty urinating, dyspareunia, dysuria, enuresis, flank pain, frequency, genital sores and vaginal pain.  Musculoskeletal: Positive for back pain and gait problem. Negative for arthralgias, joint swelling, myalgias, neck pain and neck stiffness.  Skin: Negative.   Neurological: Positive for weakness and numbness. Negative for dizziness, tremors, seizures, syncope, facial asymmetry, speech difficulty, light-headedness and headaches.  Hematological: Negative for adenopathy. Does not bruise/bleed easily.  Psychiatric/Behavioral: Negative for agitation, behavioral problems, confusion, decreased concentration, dysphoric mood, hallucinations, self-injury, sleep disturbance and suicidal ideas. The patient is not nervous/anxious and is not hyperactive.      Objective: Vital Signs: BP 140/73   Pulse 91  Ht 5' 1.75" (1.568 m)   Wt 175 lb (79.4 kg)   BMI 32.27 kg/m   Physical Exam  Ortho Exam  Specialty Comments:  No specialty comments available.  Imaging: No results found.    PMFS History: Patient Active Problem List   Diagnosis Date Noted  . Nausea & vomiting 05/13/2018  . Abnormal glucose 03/01/2018  . FHx: heart disease 03/01/2018  . Therapeutic opioid-induced constipation (OIC) 12/21/2017  . Abnormal UGI series 12/21/2017  . CKD (chronic kidney disease) stage 3, GFR 30-59 ml/min 09/27/2017  . T12 compression fracture ([place]) 09/26/2017  . Chronic bronchitis ([place]) 08/09/2017  . Atherosclerosis of aorta ([place]) 04/05/2017  . Anxiety state 06/08/2016  . Spinal stenosis, lumbar region, with neurogenic claudication 05/29/2015  . DDD (degenerative disc disease), lumbar 04/21/2015  . Hyperlipidemia, mixed 11/24/2014  . Gastroesophageal reflux disease 11/24/2014  . Depression, major, in partial remission ([place]) 09/26/2014  . Chronic pain syndrome 09/26/2014  . Coronary artery disease due to lipid rich plaque   . Osteoporosis 02/10/2014  . Prediabetes 08/14/2013  . Vitamin D deficiency 08/14/2013  . MGUS (monoclonal gammopathy of unknown significance) 03/05/2013  . Vertebral compression fracture ([place]) 06/23/2012  . ESOPHAGEAL STRICTURE 08/28/2008  . Mild intermittent asthma without complication [id number]  . Incarcerated recurrent paraesophageal hiatal hernia s/p lap redo repair [id number] 01/31/2008  . Essential hypertension 07/25/2007   Past Medical History:  Diagnosis Date  . Anxiety    takes Xanax daily as needed  . Asthma    Albuterol daily as needed  . Blood transfusion without reported diagnosis   . CAD (coronary artery disease)    LHC 2003 with 40% pLAD, 30% mLAD, 100% D1 (moderate vessel), 100%  D2 (small vessel), 95% mid small OM2.  Pt had PTCA to D1;  D2 and OM2 small vessels and tx medically;  Last Myoview (2012): anterior infarct seen with scar, no ischemia. EF normal. Med Rx // Myoview (12/14):  Low risk; ant scar w/ peri-infarct ischemia; EF 55% with ant AK // Myoview 5/16: EF 55-65, ant, apical scar, no ischemia   . Cataract   . Chronic back  pain    HNP, DDD, spinal stenosis, vertebral compression fractures.   . Chronic nausea    takes Zofran daily as needed.  normal gastric emptying study 03/2013  . CKD (chronic kidney disease), stage III 09/27/2017  . Constipation    felt to be functional. takes Senokot and Miralax daily.  treated with Linzess in past.   . DIVERTICULOSIS, COLON 08/28/2008   Qualifier: Diagnosis of  By: [name], [name], [name]    . Elevated LFTs 2015   probably med induced DILI, from Augmentin vs Pravastatin  . GERD (gastroesophageal reflux disease)    hx of esophageal stricture   . Hiatal hernia    Paraesophageal hernias. s/p fundoplications in 0000000 and 04/2013  . History of bronchitis several of yrs ago  . History of colon polyps 03/2009, 03/2011   adenomatous colon polyps, no high grade dysplasia.   [name] History of vertigo    no meds  . Hyperlipidemia    takes Pravastatin daily  . Hypertension    takes Amlodipine,Metoprolol,and Losartan daily  . Nausea 03/2016  . Osteoporosis   . PONV (postoperative nausea and vomiting)    yrs ago  . Slow urinary stream    takes Rapaflo daily  . TIA (transient ischemic attack) 1995   no residual effects noted    Family History  Problem Relation Age of Onset  .  Colon cancer Mother 60  . Heart attack Father 48       heart attack  . Brain cancer Brother        tumor   . Heart disease Brother   . Heart disease Brother   . Kidney disease Daughter   . Esophageal cancer Neg Hx   . Stomach cancer Neg Hx     Past Surgical History:  Procedure Laterality Date  . APPENDECTOMY     patient unsure of date  . BACK SURGERY    . BLADDER REPAIR    . CHOLECYSTECTOMY     Patient unsure of date.  . CORONARY ANGIOPLASTY  11/16/2001   1 stent  . ESOPHAGEAL MANOMETRY N/A 03/25/2013   Procedure: ESOPHAGEAL MANOMETRY (EM);  Surgeon: Inda Castle, MD;  Location: [facility] ENDOSCOPY;  Service: Endoscopy;  Laterality: N/A;  . ESOPHAGOGASTRODUODENOSCOPY N/A 03/15/2013   Procedure:  ESOPHAGOGASTRODUODENOSCOPY (EGD);  Surgeon: Jerene Bears, MD;  Location: [facility] ENDOSCOPY;  Service: Endoscopy;  Laterality: N/A;  . ESOPHAGOGASTRODUODENOSCOPY N/A 12/25/2015   Procedure: ESOPHAGOGASTRODUODENOSCOPY (EGD);  Surgeon: Doran Stabler, MD;  Location[facility] ENDOSCOPY;  Service: Endoscopy;  Laterality: N/A;  . EYE SURGERY  11/2000   bilateral cataracts with lens implant  . FIXATION KYPHOPLASTY LUMBAR SPINE  X 2   "L3-4"  . HEMORRHOID SURGERY    . HIATAL HERNIA REPAIR  06/03/2011   Procedure: LAPAROSCOPIC REPAIR OF HIATAL HERNIA;  Surgeon: Adin Hector, MD;  Location: [facility] ORS;  Service: General;  Laterality: N/A;  . HIATAL HERNIA REPAIR N/A 04/24/2013   Procedure: LAPAROSCOPIC  REPAIR RECURRENT PARASOPHAGEAL HIATAL HERNIA WITH FUNDOPLICATION;  Surgeon: Adin Hector, MD;  Location: [facility] ORS;  Service: General;  Laterality: N/A;  . INSERTION OF MESH N/A 04/24/2013   Procedure: INSERTION OF MESH;  Surgeon: Adin Hector, MD;  Location: [facility] ORS;  Service: General;  Laterality: N/A;  . IR KYPHO THORACIC WITH BONE BIOPSY  09/29/2017  . IR KYPHO THORACIC WITH BONE BIOPSY  11/03/2017  . IR KYPHO THORACIC WITH BONE BIOPSY  02/14/2019  . LAPAROSCOPIC LYSIS OF ADHESIONS N/A 04/24/2013   Procedure: LAPAROSCOPIC LYSIS OF ADHESIONS;  Surgeon: Adin Hector, MD;  Location: [facility];  Service: General;  Laterality: N/A;  . LAPAROSCOPIC NISSEN FUNDOPLICATION  [id number]   Procedure: LAPAROSCOPIC NISSEN FUNDOPLICATION;  Surgeon: Adin Hector, MD;  Location: [facility];  Service: General;  Laterality: N/A;  . LUMBAR FUSION  12/07/2015   Right L2-3 facetectomy, posterolateral fusion L2-3 with pedicle screws, rods, local bone graft, Vivigen cancellous chips. Fusion extended to the L1 level with pedicle screws and rods  . LUMBAR LAMINECTOMY/DECOMPRESSION MICRODISCECTOMY Right 06/26/2012   Procedure: LUMBAR LAMINECTOMY/DECOMPRESSION MICRODISCECTOMY;  Surgeon: Jessy Oto, MD;  Location: [place];  Service:  Orthopedics;  Laterality: Right;  RIGHT L4-5 MICRODISCECTOMY  . LUMBAR LAMINECTOMY/DECOMPRESSION MICRODISCECTOMY N/A 05/29/2015   Procedure: RIGHT L2-3 MICRODISCECTOMY;  Surgeon: Jessy Oto, MD;  Location: [place];  Service: Orthopedics;  Laterality: N/A;  . TOTAL ABDOMINAL HYSTERECTOMY  1975   partial  . UPPER GASTROINTESTINAL ENDOSCOPY     Social History   Occupational History  . Occupation: Retired    Fish farm manager: RETIRED    Comment: worked at [employer]  . Smoking status: Never Smoker  . Smokeless tobacco: Never Used  Substance and Sexual Activity  . Alcohol use: No  . Drug use: Never  . Sexual activity: Not on file

## 2019-03-04 ENCOUNTER — Other Ambulatory Visit (HOSPITAL_COMMUNITY): Payer: Self-pay | Admitting: Interventional Radiology

## 2019-03-04 DIAGNOSIS — N3946 Mixed incontinence: Secondary | ICD-10-CM | POA: Diagnosis not present

## 2019-03-04 DIAGNOSIS — S22070A Wedge compression fracture of T9-T10 vertebra, initial encounter for closed fracture: Secondary | ICD-10-CM

## 2019-03-05 ENCOUNTER — Telehealth: Payer: Self-pay | Admitting: *Deleted

## 2019-03-05 MED ORDER — DENOSUMAB 60 MG/ML ~~LOC~~ SOSY: 60.0000 mg | PREFILLED_SYRINGE | SUBCUTANEOUS | 0 refills | Status: DC

## 2019-03-05 NOTE — Telephone Encounter (Signed)
Scheduled patient for Prolia injection on 03/08/19.

## 2019-03-05 NOTE — Telephone Encounter (Signed)
Prescription for Prolia sent to the pharmacy. Attempted to contact the patient and no answer.

## 2019-03-05 NOTE — Telephone Encounter (Signed)
-----   Message from [employer], [facility] sent at 03/05/2019  8:33 AM EDT ----- Labs are normal and okay to start Prolia.  Please send prescription to [facility] and schedule appointment for administration.  Thank you!  Museum/gallery conservator

## 2019-03-06 ENCOUNTER — Other Ambulatory Visit: Payer: Self-pay

## 2019-03-06 ENCOUNTER — Ambulatory Visit (HOSPITAL_COMMUNITY)
Admission: RE | Admit: 2019-03-06 | Discharge: 2019-03-06 | Disposition: A | Discharge status: Home / Self Care | Payer: PPO | Source: Ambulatory Visit | Attending: Interventional Radiology | Admitting: Interventional Radiology

## 2019-03-06 DIAGNOSIS — S22070A Wedge compression fracture of T9-T10 vertebra, initial encounter for closed fracture: Secondary | ICD-10-CM

## 2019-03-06 DIAGNOSIS — M4854XA Collapsed vertebra, not elsewhere classified, thoracic region, initial encounter for fracture: Secondary | ICD-10-CM | POA: Diagnosis not present

## 2019-03-06 MED FILL — PROLIA 60 MG/ML SOLN: 60 | 180 days supply | Qty: 1 | Fill #0

## 2019-03-06 NOTE — Progress Notes (Signed)
Referring Physician(s): Dr Antionette Fairy  Supervising Physician: Luanne Bras  Patient Status:  [facility] OP  Chief Complaint:  Continued back pain T9 KP 02/14/19  Subjective:  Previous T12 KP; T11 KP Recent T9 KP  Always feel so much better post procedure This time was same until got home from hospital Immediately felt sudden pain in same area Radiates to right breast Hydrocodone does ease pain But must take up to 4 x/day  Was seen by Dr Louanne Skye 10/22: Imaging there: AP and lateral radiographs of the thoracic spine with T9 kyphoplasty,  there is a wedging of the anterior T9 vertebrae that appears worse than on previous radiographs including the flouro spot views. Measurement of the anterior height 6mm and the posterior vertebral height is 24 mm and  suggests about 30% anterior wedge deformity. Most of the deformity by MRI and plain radiographs previously was superior endplate fracture.  Series of 3 views right ribs with T9 rib with deformity suspicious for a  right T9 rib fracture, not sure of the age. Minimal displacement.   Pt now to see Dr Estanislado Pandy for evaluation of pain    Allergies: Ace inhibitors, Aspartame, Atorvastatin, Biaxin [clarithromycin], Daliresp [roflumilast], Erythromycin, and Levofloxacin  Medications: Prior to Admission medications   Medication Sig Start Date End Date Taking? Authorizing Provider  acetaminophen (TYLENOL) 500 MG tablet Take 1 tablet (500 mg total) by mouth every 6 (six) hours as needed. 03/18/18   Bast, Tressia Miners A, NP  acetaminophen-codeine (TYLENOL #3) 300-30 MG tablet Take 1-2 tablets by mouth every 8 (eight) hours as needed for moderate pain. 01/21/19   Mcarthur Rossetti, MD  ALPRAZolam Duanne Moron) 0.5 MG tablet Take 1/2 tablet 1- 2 x /day ONLY if needed for Anxiety Attack &  limit to 5 days /week to avoid addiction (Last till June 23rd) 08/28/18   [name], MD  amLODipine (NORVASC) 10 MG tablet Take 1 tablet Daily for BP & Heart  02/07/19   [name], MD  aspirin EC 81 MG tablet Take 81 mg by mouth at bedtime.     [provider]  Cholecalciferol (VITAMIN D3) 5000 units CAPS Take 5,000 Units by mouth 2 (two) times daily.     [provider]  Coral Calcium 1000 (390 Ca) MG TABS Take 1,000 mg by mouth daily. 12/14/18   [name], NP  denosumab (PROLIA) 60 MG/ML SOSY injection Inject 60 mg into the skin every 6 (six) months. 03/05/19   [name], MD  diclofenac sodium (VOLTAREN) 1 % GEL Apply 2 g topically 4 (four) times daily. 03/18/18   Bast, Tressia Miners A, NP  fluticasone furoate-vilanterol (BREO ELLIPTA) 100-25 MCG/INH AEPB Inhale 1 puff into the lungs daily. Rinse mouth with water after each use 06/07/18   Vicie Mutters, PA-C  gabapentin (NEURONTIN) 400 MG capsule Take 400 mg by mouth 3 (three) times daily. 09/17/18   [provider]  HYDROcodone-acetaminophen (NORCO) 7.5-325 MG tablet Take 1 tablet by mouth every 6 (six) hours as needed for moderate pain. 02/28/19   Jessy Oto, MD  ipratropium-albuterol (DUONEB) 0.5-2.5 (3) MG/3ML SOLN Take 3 mLs by nebulization every 6 (six) hours as needed (shortness of breath/wheezing).     [provider]  ketoconazole (NIZORAL) 2 % cream  APPLY ONE APPLICATION TOPICALLY TWO TIMES DAILY 04/02/18   [name], MD  Magnesium 400 MG TABS Take 400 mg by mouth daily.    [provider]  meclizine (ANTIVERT) 25 MG tablet Take 1/2 to  1 tablet 2 to 3 x /day ONLY if needed  For Dizziness / Vertigo 06/28/18   [name], MD  metoprolol tartrate (LOPRESSOR) 50 MG tablet Take 1 tablet 2 x /day for BP & Heart 12/23/18   [name], MD  nitroGLYCERIN (NITROSTAT) 0.4 MG SL tablet Place 1 tablet (0.4 mg total) under the tongue every 5 (five) minutes as needed for chest pain. 09/04/14   Larey Dresser, MD  omeprazole (PRILOSEC) 40 MG capsule Take 1 capsule daily  For  Heartburn, Reflux & Nausea 12/13/18   [name], NP   ondansetron (ZOFRAN) 4 MG tablet Take 1 tablet (4 mg total) by mouth every 8 (eight) hours as needed for nausea or vomiting. 05/16/18   Thurnell Lose, MD  polyethylene glycol powder (GLYCOLAX/MIRALAX) powder Take 17 g by mouth 2 (two) times daily. 05/16/17   Armbruster, Carlota Raspberry, MD  pravastatin (PRAVACHOL) 40 MG tablet Take 40 mg by mouth daily. 12/25/18   [provider]  promethazine (PHENERGAN) 25 MG tablet TAKE 1 TABLET BY MOUTH EVERY 6 HOURS AS NEEDED FOR NAUSEA AND VOMITING 05/28/18   Vicie Mutters, PA-C  senna-docusate (SENOKOT-S) 8.6-50 MG tablet Take 2 tablets by mouth 2 (two) times daily. Patient taking differently: Take 1 tablet by mouth 2 (two) times daily.  12/26/15   Roxan Hockey, MD  sucralfate (CARAFATE) 1 g tablet Take 1 tablet 2 to 4 x /day before meals for Heartburn Patient taking differently: Take 1 g by mouth 3 (three) times daily before meals.  04/17/18   [name], MD  tiZANidine (ZANAFLEX) 4 MG tablet TAKE 1 TABLET BY MOUTH EVERY 8 HOURS AS NEEDED FOR MUSCLE SPASM 02/20/19   Mcarthur Rossetti, MD     Vital Signs: There were no vitals taken for this visit.  Physical Exam Musculoskeletal:     Comments: Pt is in [facility] today Difficulty walking far. Wearing body brace Moves all 4s Pain is in mid back Radiates to Rt breast     Imaging: No results found.  Labs:  CBC: Recent Labs    09/11/18 1319 12/13/18 1139 02/14/19 0656 02/27/19 1109  WBC 7.7 8.6 10.8* 7.6  HGB 15.0 15.8* 14.2 14.4  HCT 43.9 46.7* 44.3 42.6  PLT 357 359 319 338    COAGS: Recent Labs    02/14/19 0656  INR 0.9  APTT 24    BMP: Recent Labs    06/07/18 1111 09/11/18 1319 12/13/18 1139 02/27/19 1109  NA 141 139 140 141  K 4.4 4.4 4.6 4.4  CL 104 103 101 103  CO2 25 25 29 28   GLUCOSE 109* 84 95 99  BUN 20 20 15 18   CALCIUM 10.1 10.1  10.1 10.8* 9.3  CREATININE 0.95* 0.98* 0.84 0.94*  GFRNONAA 57* 54* 65 57*  GFRAA 66 63 76 66    LIVER  FUNCTION TESTS: Recent Labs    05/12/18 1025 05/13/18 1806 05/14/18 0544 05/15/18 0512  06/07/18 1111 09/11/18 1319 12/13/18 1139 02/27/19 1109  BILITOT 0.7 1.0 1.3* 1.2   < > 0.7 0.7 0.6 0.5  AST 23 27 22 18    < > 13 20 18 19   ALT 19 22 21 18    < > 15 16 20 20   ALKPHOS 52 48 43 37*  --   --   --   --   --   PROT 7.5 7.6 6.2* 5.7*   < > 7.3 7.5 7.8 6.7  ALBUMIN 4.1 4.2 3.4* 3.0*  --   --   --   --   --    < > =  values in this interval not displayed.    Assessment and Plan:  Dr Estanislado Pandy speaks to pt and husband regarding T9 KP procedure and recent imaging with Dr Louanne Skye office Would recommend MRI T1-L2 w/o Cx Requires pre authorization-- scheduler will call pt with time and date asap. Pt is agreeable to new imaging Will plan according to result  Electronically Signed: Lavonia Drafts, PA-C 03/06/2019, 2:37 PM   I spent a total of 25 Minutes at the the patient's bedside AND on the patient's hospital floor or unit, greater than 50% of which was counseling/coordinating care for Continued back pain post T9 KP

## 2019-03-07 ENCOUNTER — Other Ambulatory Visit: Payer: Self-pay | Admitting: Orthopaedic Surgery

## 2019-03-07 NOTE — Telephone Encounter (Signed)
Please advise 

## 2019-03-08 ENCOUNTER — Other Ambulatory Visit: Payer: Self-pay

## 2019-03-08 ENCOUNTER — Ambulatory Visit (INDEPENDENT_AMBULATORY_CARE_PROVIDER_SITE_OTHER): Payer: PPO | Admitting: *Deleted

## 2019-03-08 VITALS — BP 128/68 | HR 65 | Ht 61.25 in

## 2019-03-08 DIAGNOSIS — Z79899 Other long term (current) drug therapy: Secondary | ICD-10-CM

## 2019-03-08 DIAGNOSIS — M810 Age-related osteoporosis without current pathological fracture: Secondary | ICD-10-CM | POA: Diagnosis not present

## 2019-03-08 MED ORDER — DENOSUMAB 60 MG/ML ~~LOC~~ SOSY
60.0000 mg | PREFILLED_SYRINGE | Freq: Once | SUBCUTANEOUS | Status: AC
  Administered 2019-03-08: 60 mg via SUBCUTANEOUS

## 2019-03-08 NOTE — Progress Notes (Signed)
Patient in the office for a new start to the Prolia injection. Patient was given the injection in the left arm and tolerated the injection well. Patient was monitored in office for 30 minutes for adverse reactions. No adverse reactions noted.  Patient was reminded to return to the clinic in 10 days for labs.   Administrations This Visit    denosumab (PROLIA) injection 60 mg    Admin Date 03/08/2019 Action Given Dose 60 mg Route Subcutaneous Administered By Carole Binning, LPN

## 2019-03-08 NOTE — Patient Instructions (Signed)
Standing Labs We placed an order today for your standing lab work.    Please come back and get your standing labs in 10 days  We have open lab daily Monday through Thursday from 8:30-12:30 PM and 1:30-4:30 PM and Friday from 8:30-12:30 PM and 1:30-4:00 PM at the office of Dr. [name].   You may experience shorter wait times on Monday and Friday afternoons. The office is located at [address], [place], [place], [place] [address] No appointment is necessary.   Labs are drawn by [facility].  You may receive a bill from [facility] for your lab work.  If you wish to have your labs drawn at another location, please call the office 24 hours in advance to send orders.  If you have any questions regarding directions or hours of operation,  please call [phone].   Just as a reminder please drink plenty of water prior to coming for your lab work. Thanks!

## 2019-03-11 ENCOUNTER — Other Ambulatory Visit (HOSPITAL_COMMUNITY): Payer: Self-pay | Admitting: Interventional Radiology

## 2019-03-11 ENCOUNTER — Telehealth (HOSPITAL_COMMUNITY): Payer: Self-pay

## 2019-03-11 DIAGNOSIS — N3946 Mixed incontinence: Secondary | ICD-10-CM | POA: Diagnosis not present

## 2019-03-11 DIAGNOSIS — S22070A Wedge compression fracture of T9-T10 vertebra, initial encounter for closed fracture: Secondary | ICD-10-CM

## 2019-03-11 DIAGNOSIS — N311 Reflex neuropathic bladder, not elsewhere classified: Secondary | ICD-10-CM | POA: Diagnosis not present

## 2019-03-11 NOTE — Telephone Encounter (Signed)
Called to schedule mri, no answer, left vm. AW 

## 2019-03-12 DIAGNOSIS — S22070G Wedge compression fracture of T9-T10 vertebra, subsequent encounter for fracture with delayed healing: Secondary | ICD-10-CM | POA: Diagnosis not present

## 2019-03-13 ENCOUNTER — Other Ambulatory Visit: Payer: Self-pay

## 2019-03-13 ENCOUNTER — Ambulatory Visit (HOSPITAL_COMMUNITY)
Admission: RE | Admit: 2019-03-13 | Discharge: 2019-03-13 | Disposition: A | Discharge status: Home / Self Care | Payer: PPO | Source: Ambulatory Visit | Attending: Interventional Radiology | Admitting: Interventional Radiology

## 2019-03-13 DIAGNOSIS — S22070A Wedge compression fracture of T9-T10 vertebra, initial encounter for closed fracture: Secondary | ICD-10-CM | POA: Diagnosis not present

## 2019-03-13 DIAGNOSIS — S22079A Unspecified fracture of T9-T10 vertebra, initial encounter for closed fracture: Secondary | ICD-10-CM | POA: Diagnosis not present

## 2019-03-13 DIAGNOSIS — M546 Pain in thoracic spine: Secondary | ICD-10-CM | POA: Diagnosis not present

## 2019-03-14 ENCOUNTER — Other Ambulatory Visit: Payer: Self-pay | Admitting: Specialist

## 2019-03-14 MED ORDER — HYDROCODONE-ACETAMINOPHEN 7.5-325 MG PO TABS: 1.0000 | ORAL_TABLET | Freq: Four times a day (QID) | ORAL | 0 refills | Status: DC | PRN

## 2019-03-14 NOTE — Telephone Encounter (Signed)
Pt called in requesting a refill on her medication hydrocodone and please have that sent to [facility] on [address].    [phone]

## 2019-03-18 ENCOUNTER — Ambulatory Visit (HOSPITAL_COMMUNITY): Payer: PPO

## 2019-03-18 ENCOUNTER — Other Ambulatory Visit: Payer: Self-pay

## 2019-03-18 DIAGNOSIS — G894 Chronic pain syndrome: Secondary | ICD-10-CM | POA: Diagnosis not present

## 2019-03-18 DIAGNOSIS — Z79899 Other long term (current) drug therapy: Secondary | ICD-10-CM | POA: Diagnosis not present

## 2019-03-18 DIAGNOSIS — M150 Primary generalized (osteo)arthritis: Secondary | ICD-10-CM | POA: Diagnosis not present

## 2019-03-18 DIAGNOSIS — M4726 Other spondylosis with radiculopathy, lumbar region: Secondary | ICD-10-CM | POA: Diagnosis not present

## 2019-03-18 DIAGNOSIS — M961 Postlaminectomy syndrome, not elsewhere classified: Secondary | ICD-10-CM | POA: Diagnosis not present

## 2019-03-18 NOTE — Telephone Encounter (Signed)
Received fax of from [facility] requesting copies of patient's insurance cards. Faxed requested info back to [facility].  10:07 AM Beatriz Chancellor, CPhT

## 2019-03-19 LAB — COMPLETE METABOLIC PANEL WITH GFR
AG Ratio: 1.6 (calc) (ref 1.0–2.5)
ALT: 18 U/L (ref 6–29)
AST: 23 U/L (ref 10–35)
Albumin: 4 g/dL (ref 3.6–5.1)
Alkaline phosphatase (APISO): 62 U/L (ref 37–153)
BUN/Creatinine Ratio: 17 (calc) (ref 6–22)
BUN: 17 mg/dL (ref 7–25)
CO2: 24 mmol/L (ref 20–32)
Calcium: 10 mg/dL (ref 8.6–10.4)
Chloride: 104 mmol/L (ref 98–110)
Creat: 1 mg/dL — ABNORMAL HIGH (ref 0.60–0.88)
GFR, Est African American: 61 mL/min/{1.73_m2} (ref >=60)
GFR, Est Non African American: 53 mL/min/{1.73_m2} — ABNORMAL LOW (ref >=60)
Globulin: 2.5 g/dL (calc) (ref 1.9–3.7)
Glucose, Bld: 135 mg/dL — ABNORMAL HIGH (ref 65–99)
Potassium: 4.6 mmol/L (ref 3.5–5.3)
Sodium: 138 mmol/L (ref 135–146)
Total Bilirubin: 0.6 mg/dL (ref 0.2–1.2)
Total Protein: 6.5 g/dL (ref 6.1–8.1)

## 2019-03-19 LAB — CBC WITH DIFFERENTIAL/PLATELET
Absolute Monocytes: 740 cells/uL (ref 200–950)
Basophils Absolute: 43 cells/uL (ref 0–200)
Basophils Relative: 0.5 %
Eosinophils Absolute: 68 cells/uL (ref 15–500)
Eosinophils Relative: 0.8 %
HCT: 45 % (ref 35.0–45.0)
Hemoglobin: 15.1 g/dL (ref 11.7–15.5)
Lymphs Abs: 1666 cells/uL (ref 850–3900)
MCH: 31.3 pg (ref 27.0–33.0)
MCHC: 33.6 g/dL (ref 32.0–36.0)
MCV: 93.4 fL (ref 80.0–100.0)
MPV: 11.4 fL (ref 7.5–12.5)
Monocytes Relative: 8.7 %
Neutro Abs: 5984 cells/uL (ref 1500–7800)
Neutrophils Relative %: 70.4 %
Platelets: 357 10*3/uL (ref 140–400)
RBC: 4.82 10*6/uL (ref 3.80–5.10)
RDW: 12.1 % (ref 11.0–15.0)
Total Lymphocyte: 19.6 %
WBC: 8.5 10*3/uL (ref 3.8–10.8)

## 2019-03-19 NOTE — Progress Notes (Signed)
Labs are stable.

## 2019-03-21 ENCOUNTER — Other Ambulatory Visit (HOSPITAL_COMMUNITY): Payer: Self-pay | Admitting: Interventional Radiology

## 2019-03-21 ENCOUNTER — Other Ambulatory Visit: Payer: Self-pay

## 2019-03-21 ENCOUNTER — Ambulatory Visit (INDEPENDENT_AMBULATORY_CARE_PROVIDER_SITE_OTHER): Payer: PPO | Admitting: Internal Medicine

## 2019-03-21 VITALS — BP 116/68 | HR 68 | Temp 97.9°F | Resp 18 | Ht 61.25 in | Wt 171.8 lb

## 2019-03-21 DIAGNOSIS — I2583 Coronary atherosclerosis due to lipid rich plaque: Secondary | ICD-10-CM | POA: Diagnosis not present

## 2019-03-21 DIAGNOSIS — R7309 Other abnormal glucose: Secondary | ICD-10-CM

## 2019-03-21 DIAGNOSIS — E559 Vitamin D deficiency, unspecified: Secondary | ICD-10-CM

## 2019-03-21 DIAGNOSIS — I10 Essential (primary) hypertension: Secondary | ICD-10-CM

## 2019-03-21 DIAGNOSIS — Z79899 Other long term (current) drug therapy: Secondary | ICD-10-CM

## 2019-03-21 DIAGNOSIS — I2510 Atherosclerotic heart disease of native coronary artery without angina pectoris: Secondary | ICD-10-CM

## 2019-03-21 DIAGNOSIS — E782 Mixed hyperlipidemia: Secondary | ICD-10-CM | POA: Diagnosis not present

## 2019-03-21 NOTE — Progress Notes (Signed)
History of Present Illness:      This very nice 83 y.o. MWF presents for 3 month follow up with HTN, ASCAD,  HLD, Pre-Diabetes and Vitamin D Deficiency. Patient has ongoing difficulties with chronic pain from DJD/DDD and vertebral compression Fx's. She has hx/o GERD controlled on her meds.       Patient is treated for HTN (1986) w/hx/Hypertensive Kidney Disease - Stage 3. In 2003, she had PSA/ Stent. In 2013, she had a negative Stress Myoview. BP has been controlled and today's BP  is at goal - 116/68. Patient has had no complaints of any cardiac type chest pain, palpitations, dyspnea / orthopnea / PND, dizziness, claudication, or dependent edema.      Hyperlipidemia is controlled with diet & meds. Patient denies myalgias or other med SE's. Last Lipids were at goal:  Lab Results  Component Value Date   CHOL 174 12/13/2018   HDL 72 12/13/2018   LDLCALC 82 12/13/2018   TRIG 104 12/13/2018   CHOLHDL 2.4 12/13/2018        Also, the patient has history of PreDiabetes (A1c 6.1% / 2013) and has had no symptoms of reactive hypoglycemia, diabetic polys, paresthesias or visual blurring.  Last A1c was not at goal:  Lab Results  Component Value Date   HGBA1C 5.8 (H) 09/11/2018       Further, the patient also has history of Vitamin D Deficiency (25" / 2008)   and supplements vitamin D without any suspected side-effects. Last vitamin D was near goal:  Lab Results  Component Value Date   VD25OH 56 09/11/2018    Current Outpatient Medications on File Prior to Visit  Medication Sig  . acetaminophen (TYLENOL) 500 MG tablet Take 1 tablet (500 mg total) by mouth every 6 (six) hours as needed.  [name] acetaminophen-codeine (TYLENOL #3) 300-30 MG tablet Take 1-2 tablets by mouth every 8 (eight) hours as needed for moderate pain.  [name] ALPRAZolam (XANAX) 0.5 MG tablet Take 1/2 tablet 1- 2 x /day ONLY if needed for Anxiety Attack &  limit to 5 days /week to avoid addiction (Last till June 23rd)  .  amLODipine (NORVASC) 10 MG tablet Take 1 tablet Daily for BP & Heart  . aspirin EC 81 MG tablet Take 81 mg by mouth at bedtime.   . Cholecalciferol (VITAMIN D3) 5000 units CAPS Take 5,000 Units by mouth 2 (two) times daily.   [name] Coral Calcium 1000 (390 Ca) MG TABS Take 1,000 mg by mouth daily.  [name] denosumab (PROLIA) 60 MG/ML SOSY injection Inject 60 mg into the skin every 6 (six) months.  . diclofenac sodium (VOLTAREN) 1 % GEL Apply 2 g topically 4 (four) times daily.  . fluticasone furoate-vilanterol (BREO ELLIPTA) 100-25 MCG/INH AEPB Inhale 1 puff into the lungs daily. Rinse mouth with water after each use  . gabapentin (NEURONTIN) 400 MG capsule Take 400 mg by mouth 3 (three) times daily.  [name] ipratropium-albuterol (DUONEB) 0.5-2.5 (3) MG/3ML SOLN Take 3 mLs by nebulization every 6 (six) hours as needed (shortness of breath/wheezing).   [name] ketoconazole (NIZORAL) 2 % cream  APPLY ONE APPLICATION TOPICALLY TWO TIMES DAILY  . Magnesium 400 MG TABS Take 400 mg by mouth daily.  . meclizine (ANTIVERT) 25 MG tablet Take 1/2 to 1 tablet 2 to 3 x /day ONLY if needed  For Dizziness / Vertigo  . metoprolol tartrate (LOPRESSOR) 50 MG tablet Take 1 tablet 2 x /day for BP & Heart  .  nitroGLYCERIN (NITROSTAT) 0.4 MG SL tablet Place 1 tablet (0.4 mg total) under the tongue every 5 (five) minutes as needed for chest pain.  [name] omeprazole (PRILOSEC) 40 MG capsule Take 1 capsule daily  For  Heartburn, Reflux & Nausea  . ondansetron (ZOFRAN) 4 MG tablet Take 1 tablet (4 mg total) by mouth every 8 (eight) hours as needed for nausea or vomiting.  . polyethylene glycol powder (GLYCOLAX/MIRALAX) powder Take 17 g by mouth 2 (two) times daily.  . pravastatin (PRAVACHOL) 40 MG tablet Take 40 mg by mouth daily.  . promethazine (PHENERGAN) 25 MG tablet TAKE 1 TABLET BY MOUTH EVERY 6 HOURS AS NEEDED FOR NAUSEA AND VOMITING  . senna-docusate (SENOKOT-S) 8.6-50 MG tablet Take 2 tablets by mouth 2 (two) times daily. (Patient taking  differently: Take 1 tablet by mouth 2 (two) times daily. )  . sucralfate (CARAFATE) 1 g tablet Take 1 tablet 2 to 4 x /day before meals for Heartburn (Patient taking differently: Take 1 g by mouth 3 (three) times daily before meals. )  . tiZANidine (ZANAFLEX) 4 MG tablet TAKE 1 TABLET BY MOUTH EVERY 8 HOURS AS NEEDED FOR MUSCLE SPASM   Current Facility-Administered Medications on File Prior to Visit  Medication  . calcitonin (salmon) (MIACALCIN/FORTICAL) nasal spray 1 spray    Allergies  Allergen Reactions  . Ace Inhibitors Cough    Per Dr Melvyn Novas pulmonology 2013  . Aspartame Diarrhea and Nausea And Vomiting  . Atorvastatin Other (See Comments)    Muscle aches  . Biaxin [Clarithromycin] Other (See Comments)    Unknown reaction : PATIENT CAN TOLERATE Z-PAK.  Is written on patient's paper chart.  [name] [Roflumilast] Nausea And Vomiting  . Erythromycin Other (See Comments)    Unknown reaction: PATIENT CAN TOLERATE Z-PAK.  It is written on patient's paper chart.  . Levofloxacin Nausea And Vomiting    PMHx:   Past Medical History:  Diagnosis Date  . Anxiety    takes Xanax daily as needed  . Asthma    Albuterol daily as needed  . Blood transfusion without reported diagnosis   . CAD (coronary artery disease)    LHC 2003 with 40% pLAD, 30% mLAD, 100% D1 (moderate vessel), 100%  D2 (small vessel), 95% mid small OM2.  Pt had PTCA to D1;  D2 and OM2 small vessels and tx medically;  Last Myoview (2012): anterior infarct seen with scar, no ischemia. EF normal. Med Rx // Myoview (12/14):  Low risk; ant scar w/ peri-infarct ischemia; EF 55% with ant AK // Myoview 5/16: EF 55-65, ant, apical scar, no ischemia   . Cataract   . Chronic back pain    HNP, DDD, spinal stenosis, vertebral compression fractures.   . Chronic nausea    takes Zofran daily as needed.  normal gastric emptying study 03/2013  . CKD (chronic kidney disease), stage III 09/27/2017  . Constipation    felt to be functional.  takes Senokot and Miralax daily.  treated with Linzess in past.   . DIVERTICULOSIS, COLON 08/28/2008   Qualifier: Diagnosis of  By: [name], [name], [name]    . Elevated LFTs 2015   probably med induced DILI, from Augmentin vs Pravastatin  . GERD (gastroesophageal reflux disease)    hx of esophageal stricture   . Hiatal hernia    Paraesophageal hernias. s/p fundoplications in 0000000 and 04/2013  . History of bronchitis several of yrs ago  . History of colon polyps 03/2009, 03/2011   adenomatous colon  polyps, no high grade dysplasia.   [name] History of vertigo    no meds  . Hyperlipidemia    takes Pravastatin daily  . Hypertension    takes Amlodipine,Metoprolol,and Losartan daily  . Nausea 03/2016  . Osteoporosis   . PONV (postoperative nausea and vomiting)    yrs ago  . Slow urinary stream    takes Rapaflo daily  . TIA (transient ischemic attack) 1995   no residual effects noted   Immunization History  Administered Date(s) Administered  . DT 05/06/2014  . Influenza Split 02/07/2011  . Influenza Whole 02/09/2009, 01/19/2010, 02/13/2012  . Influenza, High Dose Seasonal PF 03/22/2013, 01/15/2014, 02/27/2015, 01/14/2016, 01/18/2017, 02/14/2018, 02/14/2019  . Pneumococcal Conjugate-13 05/06/2014  . Pneumococcal Polysaccharide-23 02/09/2011  . Pneumococcal-Unspecified 02/06/2010, 02/09/2012  . Td 05/10/2003   Past Surgical History:  Procedure Laterality Date  . APPENDECTOMY     patient unsure of date  . BACK SURGERY    . BLADDER REPAIR    . CHOLECYSTECTOMY     Patient unsure of date.  . CORONARY ANGIOPLASTY  11/16/2001   1 stent  . ESOPHAGEAL MANOMETRY N/A 03/25/2013   Procedure: ESOPHAGEAL MANOMETRY (EM);  Surgeon: Inda Castle, MD;  Location: [facility];  Service: Endoscopy;  Laterality: N/A;  . ESOPHAGOGASTRODUODENOSCOPY N/A 03/15/2013   Procedure: ESOPHAGOGASTRODUODENOSCOPY (EGD);  Surgeon: Jerene Bears, MD;  Location: [facility] ENDOSCOPY;  Service: Endoscopy;  Laterality: N/A;   . ESOPHAGOGASTRODUODENOSCOPY N/A 12/25/2015   Procedure: ESOPHAGOGASTRODUODENOSCOPY (EGD);  Surgeon: Doran Stabler, MD;  Location: [facility] ENDOSCOPY;  Service: Endoscopy;  Laterality: N/A;  . EYE SURGERY  11/2000   bilateral cataracts with lens implant  . FIXATION KYPHOPLASTY LUMBAR SPINE  X 2   "L3-4"  . HEMORRHOID SURGERY    . HIATAL HERNIA REPAIR  06/03/2011   Procedure: LAPAROSCOPIC REPAIR OF HIATAL HERNIA;  Surgeon: Adin Hector, MD;  Location: [facility];  Service: General;  Laterality: N/A;  . HIATAL HERNIA REPAIR N/A 04/24/2013   Procedure: LAPAROSCOPIC  REPAIR RECURRENT PARASOPHAGEAL HIATAL HERNIA WITH FUNDOPLICATION;  Surgeon: Adin Hector, MD;  Location: [facility];  Service: General;  Laterality: N/A;  . INSERTION OF MESH N/A 04/24/2013   Procedure: INSERTION OF MESH;  Surgeon: Adin Hector, MD;  Location: [facility];  Service: General;  Laterality: N/A;  . IR KYPHO THORACIC WITH BONE BIOPSY  09/29/2017  . IR KYPHO THORACIC WITH BONE BIOPSY  11/03/2017  . IR KYPHO THORACIC WITH BONE BIOPSY  02/14/2019  . LAPAROSCOPIC LYSIS OF ADHESIONS N/A 04/24/2013   Procedure: LAPAROSCOPIC LYSIS OF ADHESIONS;  Surgeon: Adin Hector, MD;  Location: [facility];  Service: General;  Laterality: N/A;  . LAPAROSCOPIC NISSEN FUNDOPLICATION  [id number]   Procedure: LAPAROSCOPIC NISSEN FUNDOPLICATION;  Surgeon: Adin Hector, MD;  Location: [facility];  Service: General;  Laterality: N/A;  . LUMBAR FUSION  12/07/2015   Right L2-3 facetectomy, posterolateral fusion L2-3 with pedicle screws, rods, local bone graft, Vivigen cancellous chips. Fusion extended to the L1 level with pedicle screws and rods  . LUMBAR LAMINECTOMY/DECOMPRESSION MICRODISCECTOMY Right 06/26/2012   Procedure: LUMBAR LAMINECTOMY/DECOMPRESSION MICRODISCECTOMY;  Surgeon: Jessy Oto, MD;  Location: [place];  Service: Orthopedics;  Laterality: Right;  RIGHT L4-5 MICRODISCECTOMY  . LUMBAR LAMINECTOMY/DECOMPRESSION MICRODISCECTOMY N/A 05/29/2015    Procedure: RIGHT L2-3 MICRODISCECTOMY;  Surgeon: Jessy Oto, MD;  Location: [place];  Service: Orthopedics;  Laterality: N/A;  . TOTAL ABDOMINAL HYSTERECTOMY  1975   partial  . UPPER GASTROINTESTINAL ENDOSCOPY  FHx:    Reviewed / unchanged  SHx:    Reviewed / unchanged   Systems Review:  Constitutional: Denies fever, chills, wt changes, headaches, insomnia, fatigue, night sweats, change in appetite. Eyes: Denies redness, blurred vision, diplopia, discharge, itchy, watery eyes.  ENT: Denies discharge, congestion, post nasal drip, epistaxis, sore throat, earache, hearing loss, dental pain, tinnitus, vertigo, sinus pain, snoring.  CV: Denies chest pain, palpitations, irregular heartbeat, syncope, dyspnea, diaphoresis, orthopnea, PND, claudication or edema. Respiratory: denies cough, dyspnea, DOE, pleurisy, hoarseness, laryngitis, wheezing.  Gastrointestinal: Denies dysphagia, odynophagia, heartburn, reflux, water brash, abdominal pain or cramps, nausea, vomiting, bloating, diarrhea, constipation, hematemesis, melena, hematochezia  or hemorrhoids. Genitourinary: Denies dysuria, frequency, urgency, nocturia, hesitancy, discharge, hematuria or flank pain. Musculoskeletal: Denies arthralgias, myalgias, stiffness, jt. swelling, pain, limping or strain/sprain.  Skin: Denies pruritus, rash, hives, warts, acne, eczema or change in skin lesion(s). Neuro: No weakness, tremor, incoordination, spasms, paresthesia or pain. Psychiatric: Denies confusion, memory loss or sensory loss. Endo: Denies change in weight, skin or hair change.  Heme/Lymph: No excessive bleeding, bruising or enlarged lymph nodes.  Physical Exam  BP 116/68   Pulse 68   Temp 97.9 F (36.6 C)   Resp 18   Ht 5' 1.25" (1.556 m)   Wt 171 lb 12.8 oz (77.9 kg)   BMI 32.20 kg/m   Appears  well nourished, well groomed  and in no distress.  Eyes: PERRLA, EOMs, conjunctiva no swelling or erythema. Sinuses: No frontal/maxillary  tenderness ENT/Mouth: EAC's clear, TM's nl w/o erythema, bulging. Nares clear w/o erythema, swelling, exudates. Oropharynx clear without erythema or exudates. Oral hygiene is good. Tongue normal, non obstructing. Hearing intact.  Neck: Supple. Thyroid not palpable. Car 2+/2+ without bruits, nodes or JVD. Chest: Respirations nl with BS clear & equal w/o rales, rhonchi, wheezing or stridor.  Cor: Heart sounds normal w/ regular rate and rhythm without sig. murmurs, gallops, clicks or rubs. Peripheral pulses normal and equal  without edema.  Abdomen: Soft & bowel sounds normal. Non-tender w/o guarding, rebound, hernias, masses or organomegaly.  Lymphatics: Unremarkable.  Musculoskeletal: Full ROM all peripheral extremities, joint stability, 5/5 strength and normal gait.  Skin: Warm, dry without exposed rashes, lesions or ecchymosis apparent.  Neuro: Cranial nerves intact, reflexes equal bilaterally. Sensory-motor testing grossly intact. Tendon reflexes grossly intact.  Pysch: Alert & oriented x 3.  Insight and judgement nl & appropriate. No ideations.  Assessment and Plan:  1. Essential hypertension  - Continue medication, monitor blood pressure at home.  - Continue DASH diet.  Reminder to go to the ER if any CP,  SOB, nausea, dizziness, severe HA, changes vision/speech.  - CBC with Diff - COMPLETE METABOLIC PANEL WITH GFR - Magnesium - TSH  2. Hyperlipidemia, mixed  - Continue diet/meds, exercise,& lifestyle modifications.  - Continue monitor periodic cholesterol/liver & renal functions   - Lipid Profile - TSH  3. Abnormal glucose  - Continue diet, exercise  - Lifestyle modifications.  - Monitor appropriate labs.  - Hemoglobin A1c ([facility]) - Insulin, random  4. Vitamin D deficiency  - Continue supplementation.  - Vitamin D (25 hydroxy)  5. Coronary artery disease due to lipid rich plaque  - Lipid Profile  6. Medication management  - CBC with Diff - COMPLETE  METABOLIC PANEL WITH GFR - Magnesium - Lipid Profile - TSH - Hemoglobin A1c ([facility]) - Insulin, random - Vitamin D (25 hydroxy)        Discussed  regular exercise, BP monitoring, weight control  to achieve/maintain BMI less than 25 and discussed med and SE's. Recommended labs to assess and monitor clinical status with further disposition pending results of labs.  I discussed the assessment and treatment plan with the patient. The patient was provided an opportunity to ask questions and all were answered. The patient agreed with the plan and demonstrated an understanding of the instructions.  I provided over 30 minutes of exam, counseling, chart review and  complex critical decision making.  Kirtland Bouchard, MD

## 2019-03-21 NOTE — Patient Instructions (Signed)
Vit D  & Vit C 1,000 mg   are recommended to help protect  against the Covid-19 and other Corona viruses.    Also it's recommended  to take  Zinc 50 mg  to help  protect against the Covid-19   and best place to get  is also on [employer]  and don't pay more than 6-8 cents /pill !   ===================================== Coronavirus (COVID-19) Are you at risk?  Are you at risk for the Coronavirus (COVID-19)?  To be considered HIGH RISK for Coronavirus (COVID-19), you have to meet the following criteria:  . Traveled to [place], [place], [place], [place] or [place]; or in the [place] to [place], [place], [place]  . or [place]; and have fever, cough, and shortness of breath within the last 2 weeks of travel OR . Been in close contact with a person diagnosed with COVID-19 within the last 2 weeks and have  . fever, cough,and shortness of breath .  . IF YOU DO NOT MEET THESE CRITERIA, YOU ARE CONSIDERED LOW RISK FOR COVID-19.  What to do if you are HIGH RISK for COVID-19?  . If you are having a medical emergency, call 911. . Seek medical care right away. Before you go to a doctor's office, urgent care or emergency department, .  call ahead and tell them about your recent travel, contact with someone diagnosed with COVID-19  .  and your symptoms.  . You should receive instructions from your physician's office regarding next steps of care.  . When you arrive at healthcare provider, tell the healthcare staff immediately you have returned from  . visiting [place], [place], [place], [place] or [place]; or traveled in the [place] to [place], [place],  . [place] or [place] in the last two weeks or you have been in close contact with a person diagnosed with  . COVID-19 in the last 2 weeks.   . Tell the health care staff about your symptoms: fever, cough and shortness of breath. . After you have been seen by a medical provider, you will be either: o Tested for  (COVID-19) and discharged home on quarantine except to seek medical care if  o symptoms worsen, and asked to  - Stay home and avoid contact with others until you get your results (4-5 days)  - Avoid travel on public transportation if possible (such as bus, train, or airplane) or o Sent to the Emergency Department by EMS for evaluation, COVID-19 testing  and  o possible admission depending on your condition and test results.  What to do if you are LOW RISK for COVID-19?  Reduce your risk of any infection by using the same precautions used for avoiding the common cold or flu:  . Wash your hands often with soap and warm water for at least 20 seconds.  If soap and water are not readily available,  . use an alcohol-based hand sanitizer with at least 60% alcohol.  . If coughing or sneezing, cover your mouth and nose by coughing or sneezing into the elbow areas of your shirt or coat, .  into a tissue or into your sleeve (not your hands). . Avoid shaking hands with others and consider head nods or verbal greetings only. . Avoid touching your eyes, nose, or mouth with unwashed hands.  . Avoid close contact with people who are sick. . Avoid places or events with large numbers of people in one location, like concerts or sporting events. .   Carefully consider travel plans you have or are making. . If you are planning any travel outside or inside the [place], visit the CDC's Travelers' Health webpage for the latest health notices. . If you have some symptoms but not all symptoms, continue to monitor at home and seek medical attention  . if your symptoms worsen. . If you are having a medical emergency, call 911.   ++++++++++++++++++++++++++++++++ Recommend Adult Low Dose Aspirin or  coated  Aspirin 81 mg daily  To reduce risk of Colon Cancer 40 %,  Skin Cancer 26 % ,  Melanoma 46%  and  Pancreatic cancer 60% ++++++++++++++++++++++++++++++++ Vitamin D goal  is between 70-100.  Please make sure that  you are taking your Vitamin D as directed.  It is very important as a natural anti-inflammatory  helping hair, skin, and nails, as well as reducing stroke and heart attack risk.  It helps your bones and helps with mood. It also decreases numerous cancer risks so please take it as directed.  Low Vit D is associated with a 200-300% higher risk for CANCER  and 200-300% higher risk for HEART   ATTACK  &  STROKE.   ...................................... It is also associated with higher death rate at younger ages,  autoimmune diseases like Rheumatoid arthritis, Lupus, Multiple Sclerosis.    Also many other serious conditions, like depression, Alzheimer's Dementia, infertility, muscle aches, fatigue, fibromyalgia - just to name a few. ++++++++++++++++++++ Recommend the book "The END of DIETING" by Dr Joel Fuhrman  & the book "The END of DIABETES " by Dr Joel Fuhrman At [employer] - get book & Audio CD's    Being diabetic has a  300% increased risk for heart attack, stroke, cancer, and alzheimer- type vascular dementia. It is very important that you work harder with diet by avoiding all foods that are white. Avoid white rice (brown & wild rice is OK), white potatoes (sweetpotatoes in moderation is OK), White bread or wheat bread or anything made out of white flour like bagels, donuts, rolls, buns, biscuits, cakes, pastries, cookies, pizza crust, and pasta (made from white flour & egg whites) - vegetarian pasta or spinach or wheat pasta is OK. Multigrain breads like Arnold's or Pepperidge [employer], or multigrain sandwich thins or flatbreads.  Diet, exercise and weight loss can reverse and cure diabetes in the early stages.  Diet, exercise and weight loss is very important in the control and prevention of complications of diabetes which affects every system in your body, ie. Brain - dementia/stroke, eyes - glaucoma/blindness, heart - heart attack/heart failure, kidneys - dialysis, stomach - gastric paralysis,  intestines - malabsorption, nerves - severe painful neuritis, circulation - gangrene & loss of a leg(s), and finally cancer and Alzheimers.    I recommend avoid fried & greasy foods,  sweets/candy, white rice (brown or wild rice or Quinoa is OK), white potatoes (sweet potatoes are OK) - anything made from white flour - bagels, doughnuts, rolls, buns, biscuits,white and wheat breads, pizza crust and traditional pasta made of white flour & egg white(vegetarian pasta or spinach or wheat pasta is OK).  Multi-grain bread is OK - like multi-grain flat bread or sandwich thins. Avoid alcohol in excess. Exercise is also important.    Eat all the vegetables you want - avoid meat, especially red meat and dairy - especially cheese.  Cheese is the most concentrated form of trans-fats which is the worst thing to clog up our arteries. Veggie cheese is OK which can be   found in the fresh produce section at [employer] or [employer] or [employer]  +++++++++++++++++++++ DASH Eating Plan  DASH stands for "Dietary Approaches to Stop Hypertension."   The DASH eating plan is a healthy eating plan that has been shown to reduce high blood pressure (hypertension). Additional health benefits may include reducing the risk of type 2 diabetes mellitus, heart disease, and stroke. The DASH eating plan may also help with weight loss. WHAT DO I NEED TO KNOW ABOUT THE DASH EATING PLAN? For the DASH eating plan, you will follow these general guidelines:  Choose foods with a percent daily value for sodium of less than 5% (as listed on the food label).  Use salt-free seasonings or herbs instead of table salt or sea salt.  Check with your health care provider or pharmacist before using salt substitutes.  Eat lower-sodium products, often labeled as "lower sodium" or "no salt added."  Eat fresh foods.  Eat more vegetables, fruits, and low-fat dairy products.  Choose whole grains. Look for the word "whole" as the first word in  the ingredient list.  Choose fish   Limit sweets, desserts, sugars, and sugary drinks.  Choose heart-healthy fats.  Eat veggie cheese   Eat more home-cooked food and less restaurant, buffet, and fast food.  Limit fried foods.  Cook foods using methods other than frying.  Limit canned vegetables. If you do use them, rinse them well to decrease the sodium.  When eating at a restaurant, ask that your food be prepared with less salt, or no salt if possible.                      WHAT FOODS CAN I EAT? Read Dr Joel Fuhrman's books on The End of Dieting & The End of Diabetes  Grains Whole grain or whole wheat bread. Brown rice. Whole grain or whole wheat pasta. Quinoa, bulgur, and whole grain cereals. Low-sodium cereals. Corn or whole wheat flour tortillas. Whole grain cornbread. Whole grain crackers. Low-sodium crackers.  Vegetables Fresh or frozen vegetables (raw, steamed, roasted, or grilled). Low-sodium or reduced-sodium tomato and vegetable juices. Low-sodium or reduced-sodium tomato sauce and paste. Low-sodium or reduced-sodium canned vegetables.   Fruits All fresh, canned (in natural juice), or frozen fruits.  Protein Products  All fish and seafood.  Dried beans, peas, or lentils. Unsalted nuts and seeds. Unsalted canned beans.  Dairy Low-fat dairy products, such as skim or 1% milk, 2% or reduced-fat cheeses, low-fat ricotta or cottage cheese, or plain low-fat yogurt. Low-sodium or reduced-sodium cheeses.  Fats and Oils Tub margarines without trans fats. Light or reduced-fat mayonnaise and salad dressings (reduced sodium). Avocado. Safflower, olive, or canola oils. Natural peanut or almond butter.  Other Unsalted popcorn and pretzels. The items listed above may not be a complete list of recommended foods or beverages. Contact your dietitian for more options.  +++++++++++++++  WHAT FOODS ARE NOT RECOMMENDED? Grains/ White flour or wheat flour White bread. White pasta.  White rice. Refined cornbread. Bagels and croissants. Crackers that contain trans fat.  Vegetables  Creamed or fried vegetables. Vegetables in a . Regular canned vegetables. Regular canned tomato sauce and paste. Regular tomato and vegetable juices.  Fruits Dried fruits. Canned fruit in light or heavy syrup. Fruit juice.  Meat and Other Protein Products Meat in general - RED meat & White meat.  Fatty cuts of meat. Ribs, chicken wings, all processed meats as bacon, sausage, bologna, salami, fatback, hot dogs, bratwurst and packaged   luncheon meats.  Dairy Whole or 2% milk, cream, half-and-half, and cream cheese. Whole-fat or sweetened yogurt. Full-fat cheeses or blue cheese. Non-dairy creamers and whipped toppings. Processed cheese, cheese spreads, or cheese curds.  Condiments Onion and garlic salt, seasoned salt, table salt, and sea salt. Canned and packaged gravies. Worcestershire sauce. Tartar sauce. Barbecue sauce. Teriyaki sauce. Soy sauce, including reduced sodium. Steak sauce. Fish sauce. Oyster sauce. Cocktail sauce. Horseradish. Ketchup and mustard. Meat flavorings and tenderizers. Bouillon cubes. Hot sauce. Tabasco sauce. Marinades. Taco seasonings. Relishes.  Fats and Oils Butter, stick margarine, lard, shortening and bacon fat. Coconut, palm kernel, or palm oils. Regular salad dressings.  Pickles and olives. Salted popcorn and pretzels.  The items listed above may not be a complete list of foods and beverages to avoid.   

## 2019-03-22 LAB — VITAMIN D 25 HYDROXY (VIT D DEFICIENCY, FRACTURES): Vit D, 25-Hydroxy: 89 ng/mL (ref 30–100)

## 2019-03-22 LAB — COMPLETE METABOLIC PANEL WITH GFR
AG Ratio: 1.7 (calc) (ref 1.0–2.5)
ALT: 16 U/L (ref 6–29)
AST: 18 U/L (ref 10–35)
Albumin: 4.2 g/dL (ref 3.6–5.1)
Alkaline phosphatase (APISO): 63 U/L (ref 37–153)
BUN/Creatinine Ratio: 15 (calc) (ref 6–22)
BUN: 13 mg/dL (ref 7–25)
CO2: 27 mmol/L (ref 20–32)
Calcium: 9.7 mg/dL (ref 8.6–10.4)
Chloride: 103 mmol/L (ref 98–110)
Creat: 0.89 mg/dL — ABNORMAL HIGH (ref 0.60–0.88)
GFR, Est African American: 70 mL/min/{1.73_m2} (ref >=60)
GFR, Est Non African American: 61 mL/min/{1.73_m2} (ref >=60)
Globulin: 2.5 g/dL (calc) (ref 1.9–3.7)
Glucose, Bld: 108 mg/dL — ABNORMAL HIGH (ref 65–99)
Potassium: 4.4 mmol/L (ref 3.5–5.3)
Sodium: 140 mmol/L (ref 135–146)
Total Bilirubin: 0.5 mg/dL (ref 0.2–1.2)
Total Protein: 6.7 g/dL (ref 6.1–8.1)

## 2019-03-22 LAB — CBC WITH DIFFERENTIAL/PLATELET
Absolute Monocytes: 699 cells/uL (ref 200–950)
Basophils Absolute: 30 cells/uL (ref 0–200)
Basophils Relative: 0.4 %
Eosinophils Absolute: 106 cells/uL (ref 15–500)
Eosinophils Relative: 1.4 %
HCT: 44.2 % (ref 35.0–45.0)
Hemoglobin: 14.7 g/dL (ref 11.7–15.5)
Lymphs Abs: 1900 cells/uL (ref 850–3900)
MCH: 31.5 pg (ref 27.0–33.0)
MCHC: 33.3 g/dL (ref 32.0–36.0)
MCV: 94.6 fL (ref 80.0–100.0)
MPV: 11.2 fL (ref 7.5–12.5)
Monocytes Relative: 9.2 %
Neutro Abs: 4864 cells/uL (ref 1500–7800)
Neutrophils Relative %: 64 %
Platelets: 336 10*3/uL (ref 140–400)
RBC: 4.67 10*6/uL (ref 3.80–5.10)
RDW: 12.6 % (ref 11.0–15.0)
Total Lymphocyte: 25 %
WBC: 7.6 10*3/uL (ref 3.8–10.8)

## 2019-03-22 LAB — LIPID PANEL
Cholesterol: 152 mg/dL (ref <200)
HDL: 64 mg/dL (ref >=50)
LDL Cholesterol (Calc): 72 mg/dL (calc)
Non-HDL Cholesterol (Calc): 88 mg/dL (calc) (ref <130)
Total CHOL/HDL Ratio: 2.4 (calc) (ref <5.0)
Triglycerides: 80 mg/dL (ref <150)

## 2019-03-22 LAB — INSULIN, RANDOM: Insulin: 12.4 u[IU]/mL

## 2019-03-22 LAB — HEMOGLOBIN A1C
Hgb A1c MFr Bld: 5.9 % of total Hgb — ABNORMAL HIGH (ref <5.7)
Mean Plasma Glucose: 123 (calc)
eAG (mmol/L): 6.8 (calc)

## 2019-03-22 LAB — MAGNESIUM: Magnesium: 2.2 mg/dL (ref 1.5–2.5)

## 2019-03-22 LAB — TSH: TSH: 0.6 mIU/L (ref 0.40–4.50)

## 2019-03-23 ENCOUNTER — Other Ambulatory Visit: Payer: Self-pay | Admitting: Orthopaedic Surgery

## 2019-03-24 ENCOUNTER — Encounter: Payer: Self-pay | Admitting: Internal Medicine

## 2019-03-25 ENCOUNTER — Other Ambulatory Visit: Payer: Self-pay | Admitting: Physician Assistant

## 2019-03-25 DIAGNOSIS — J4520 Mild intermittent asthma, uncomplicated: Secondary | ICD-10-CM

## 2019-03-25 NOTE — Telephone Encounter (Signed)
Please advise 

## 2019-03-26 ENCOUNTER — Other Ambulatory Visit: Payer: Self-pay | Admitting: Radiology

## 2019-03-26 ENCOUNTER — Telehealth: Payer: Self-pay | Admitting: Specialist

## 2019-03-26 MED ORDER — HYDROCODONE-ACETAMINOPHEN 7.5-325 MG PO TABS: 1.0000 | ORAL_TABLET | Freq: Four times a day (QID) | ORAL | 0 refills | Status: DC | PRN

## 2019-03-26 NOTE — Telephone Encounter (Signed)
Sent request to Dr. nitka 

## 2019-03-26 NOTE — Telephone Encounter (Signed)
Pt called in requesting a refill on Hydrocodone, Please have that sent to [facility] on [address].  [phone]

## 2019-03-28 ENCOUNTER — Other Ambulatory Visit (HOSPITAL_COMMUNITY): Payer: Self-pay | Admitting: Interventional Radiology

## 2019-03-28 ENCOUNTER — Ambulatory Visit: Payer: PPO | Admitting: Physician Assistant

## 2019-03-28 DIAGNOSIS — S22060A Wedge compression fracture of T7-T8 vertebra, initial encounter for closed fracture: Secondary | ICD-10-CM

## 2019-03-29 ENCOUNTER — Ambulatory Visit: Payer: PPO | Admitting: Specialist

## 2019-04-01 ENCOUNTER — Other Ambulatory Visit: Payer: Self-pay | Admitting: Radiology

## 2019-04-02 ENCOUNTER — Ambulatory Visit (HOSPITAL_COMMUNITY)
Admission: RE | Admit: 2019-04-02 | Discharge: 2019-04-02 | Disposition: A | Discharge status: Home / Self Care | Payer: PPO | Source: Ambulatory Visit | Attending: Interventional Radiology | Admitting: Interventional Radiology

## 2019-04-02 ENCOUNTER — Other Ambulatory Visit: Payer: Self-pay

## 2019-04-02 ENCOUNTER — Encounter (HOSPITAL_COMMUNITY): Payer: Self-pay

## 2019-04-02 DIAGNOSIS — J45909 Unspecified asthma, uncomplicated: Secondary | ICD-10-CM | POA: Insufficient documentation

## 2019-04-02 DIAGNOSIS — F419 Anxiety disorder, unspecified: Secondary | ICD-10-CM | POA: Insufficient documentation

## 2019-04-02 DIAGNOSIS — M545 Low back pain: Secondary | ICD-10-CM | POA: Diagnosis not present

## 2019-04-02 DIAGNOSIS — Z7982 Long term (current) use of aspirin: Secondary | ICD-10-CM | POA: Insufficient documentation

## 2019-04-02 DIAGNOSIS — I2510 Atherosclerotic heart disease of native coronary artery without angina pectoris: Secondary | ICD-10-CM | POA: Insufficient documentation

## 2019-04-02 DIAGNOSIS — I129 Hypertensive chronic kidney disease with stage 1 through stage 4 chronic kidney disease, or unspecified chronic kidney disease: Secondary | ICD-10-CM | POA: Insufficient documentation

## 2019-04-02 DIAGNOSIS — E785 Hyperlipidemia, unspecified: Secondary | ICD-10-CM | POA: Insufficient documentation

## 2019-04-02 DIAGNOSIS — M810 Age-related osteoporosis without current pathological fracture: Secondary | ICD-10-CM | POA: Diagnosis not present

## 2019-04-02 DIAGNOSIS — G8929 Other chronic pain: Secondary | ICD-10-CM | POA: Insufficient documentation

## 2019-04-02 DIAGNOSIS — Z79899 Other long term (current) drug therapy: Secondary | ICD-10-CM | POA: Diagnosis not present

## 2019-04-02 DIAGNOSIS — X58XXXS Exposure to other specified factors, sequela: Secondary | ICD-10-CM | POA: Diagnosis not present

## 2019-04-02 DIAGNOSIS — N183 Chronic kidney disease, stage 3 unspecified: Secondary | ICD-10-CM | POA: Diagnosis not present

## 2019-04-02 DIAGNOSIS — S22000S Wedge compression fracture of unspecified thoracic vertebra, sequela: Secondary | ICD-10-CM | POA: Diagnosis not present

## 2019-04-02 DIAGNOSIS — Z8673 Personal history of transient ischemic attack (TIA), and cerebral infarction without residual deficits: Secondary | ICD-10-CM | POA: Insufficient documentation

## 2019-04-02 DIAGNOSIS — S22060A Wedge compression fracture of T7-T8 vertebra, initial encounter for closed fracture: Secondary | ICD-10-CM | POA: Diagnosis not present

## 2019-04-02 DIAGNOSIS — K219 Gastro-esophageal reflux disease without esophagitis: Secondary | ICD-10-CM | POA: Insufficient documentation

## 2019-04-02 HISTORY — PX: IR KYPHO THORACIC WITH BONE BIOPSY: IMG5518

## 2019-04-02 LAB — BASIC METABOLIC PANEL
Anion gap: 9 (ref 5–15)
BUN: 17 mg/dL (ref 8–23)
CO2: 26 mmol/L (ref 22–32)
Calcium: 9.6 mg/dL (ref 8.9–10.3)
Chloride: 104 mmol/L (ref 98–111)
Creatinine, Ser: 0.99 mg/dL (ref 0.44–1.00)
GFR calc Af Amer: >60 mL/min (ref >60)
GFR calc non Af Amer: 53 mL/min — ABNORMAL LOW (ref >60)
Glucose, Bld: 109 mg/dL — ABNORMAL HIGH (ref 70–99)
Potassium: 4 mmol/L (ref 3.5–5.1)
Sodium: 139 mmol/L (ref 135–145)

## 2019-04-02 LAB — URINALYSIS, COMPLETE (UACMP) WITH MICROSCOPIC
Bacteria, UA: NONE SEEN
Bilirubin Urine: NEGATIVE
Glucose, UA: NEGATIVE mg/dL
Hgb urine dipstick: NEGATIVE
Ketones, ur: NEGATIVE mg/dL
Leukocytes,Ua: NEGATIVE
Nitrite: NEGATIVE
Protein, ur: NEGATIVE mg/dL
Specific Gravity, Urine: 1.01 (ref 1.005–1.030)
pH: 8 (ref 5.0–8.0)

## 2019-04-02 LAB — CBC
HCT: 46.8 % — ABNORMAL HIGH (ref 36.0–46.0)
Hemoglobin: 15.3 g/dL — ABNORMAL HIGH (ref 12.0–15.0)
MCH: 31.8 pg (ref 26.0–34.0)
MCHC: 32.7 g/dL (ref 30.0–36.0)
MCV: 97.3 fL (ref 80.0–100.0)
Platelets: 339 10*3/uL (ref 150–400)
RBC: 4.81 MIL/uL (ref 3.87–5.11)
RDW: 13.4 % (ref 11.5–15.5)
WBC: 6.2 10*3/uL (ref 4.0–10.5)
nRBC: 0 % (ref 0.0–0.2)

## 2019-04-02 LAB — PROTIME-INR
INR: 0.9 (ref 0.8–1.2)
Prothrombin Time: 12 seconds (ref 11.4–15.2)

## 2019-04-02 MED ORDER — SODIUM CHLORIDE 0.9 % IV SOLN: INTRAVENOUS | Status: AC

## 2019-04-02 MED ORDER — MIDAZOLAM HCL 2 MG/2ML IJ SOLN
INTRAMUSCULAR | Status: AC | PRN
  Administered 2019-04-02 (×2): 1 mg via INTRAVENOUS

## 2019-04-02 MED ORDER — CEFAZOLIN SODIUM-DEXTROSE 2-4 GM/100ML-% IV SOLN
2.0000 g | INTRAVENOUS | Status: AC
  Administered 2019-04-02: 2 g via INTRAVENOUS

## 2019-04-02 MED ORDER — HYDROMORPHONE HCL 1 MG/ML IJ SOLN
INTRAMUSCULAR | Status: AC
  Filled 2019-04-02: qty 1

## 2019-04-02 MED ORDER — FENTANYL CITRATE (PF) 100 MCG/2ML IJ SOLN
INTRAMUSCULAR | Status: AC | PRN
  Administered 2019-04-02 (×2): 12.5 ug via INTRAVENOUS
  Administered 2019-04-02 (×2): 25 ug via INTRAVENOUS

## 2019-04-02 MED ORDER — SODIUM CHLORIDE 0.9 % IV SOLN
INTRAVENOUS | Status: DC
  Administered 2019-04-02: 10:00:00 via INTRAVENOUS

## 2019-04-02 MED ORDER — MIDAZOLAM HCL 2 MG/2ML IJ SOLN
INTRAMUSCULAR | Status: AC
  Filled 2019-04-02: qty 4

## 2019-04-02 MED ORDER — FENTANYL CITRATE (PF) 100 MCG/2ML IJ SOLN
INTRAMUSCULAR | Status: AC
  Filled 2019-04-02: qty 4

## 2019-04-02 MED ORDER — CEFAZOLIN SODIUM-DEXTROSE 2-4 GM/100ML-% IV SOLN
INTRAVENOUS | Status: AC
  Filled 2019-04-02: qty 100

## 2019-04-02 MED ORDER — IOHEXOL 300 MG/ML  SOLN
50.0000 mL | Freq: Once | INTRAMUSCULAR | Status: AC | PRN
  Administered 2019-04-02: 10 mL

## 2019-04-02 MED ORDER — TOBRAMYCIN SULFATE 1.2 G IJ SOLR
INTRAMUSCULAR | Status: AC
  Filled 2019-04-02: qty 1.2

## 2019-04-02 MED ORDER — BUPIVACAINE HCL (PF) 0.5 % IJ SOLN
INTRAMUSCULAR | Status: AC
  Administered 2019-04-02: 20 mL
  Filled 2019-04-02: qty 30

## 2019-04-02 NOTE — Telephone Encounter (Signed)
error 

## 2019-04-02 NOTE — Progress Notes (Signed)
Discharge instructions reviewed with pt and her husband both voice understanding.  

## 2019-04-02 NOTE — Progress Notes (Signed)
Ambulated to bathroom with assist to void tol well

## 2019-04-02 NOTE — Procedures (Signed)
S/P T 8 balloon KP 

## 2019-04-02 NOTE — Discharge Instructions (Addendum)
1. No stooping,bending or lifting more than 10 lbss for 2 weeks. 2.Use walker to ambulate for  2 weeks. 3 No driving for 2 weeks. 4.See PRN 2 weeks  Balloon Kyphoplasty, Care After This sheet gives you information about how to care for yourself after your procedure. Your health care provider may also give you more specific instructions. If you have problems or questions, contact your health care provider. What can I expect after the procedure? After your procedure, it is common to have back pain. Follow these instructions at home: Medicines  Take over-the-counter and prescription medicines only as told by your health care provider.  Ask your health care provider if the medicine prescribed to you: ? Requires you to avoid driving or using heavy machinery. ? Can cause constipation. You may need to take steps to prevent or treat constipation, such as:  Drink enough fluid to keep your urine pale yellow.  Take over-the-counter or prescription medicines.  Eat foods that are high in fiber, such as beans, whole grains, and fresh fruits and vegetables.  Limit foods that are high in fat and processed sugars, such as fried or sweet foods. Puncture site care   Follow instructions from your health care provider about how to take care of your puncture site. Make sure you: ? Wash your hands with soap and water before and after you change your bandage (dressing). If soap and water are not available, use hand sanitizer. ? Change your dressing as told by your health care provider. ? Leave skin glue or adhesive strips in place. These skin closures may need to be in place for 2 weeks or longer. If adhesive strip edges start to loosen and curl up, you may trim the loose edges. Do not remove adhesive strips completely unless your health care provider tells you to do that.  Check your puncture site every day for signs of infection. Watch for: ? Redness, swelling, or pain. ? Fluid or  blood. ? Warmth. ? Pus or a bad smell.  Keep your dressing dry until your health care provider says that it can be removed. Managing pain, stiffness, and swelling   If directed, put ice on the painful area. ? Put ice in a plastic bag. ? Place a towel between your skin and the bag. ? Leave the ice on for 20 minutes, 2-3 times a day. Activity  Rest your back and avoid intense physical activity for as long as told by your health care provider.  Avoid bending, lifting, or twisting your back for as long as told by your health care provider.  Return to your normal activities as told by your health care provider. Ask your health care provider what activities are safe for you.  Do not lift anything that is heavier than 5 lb (2.2 kg). You may need to avoid heavy lifting for several weeks. General instructions  Do not use any products that contain nicotine or tobacco, such as cigarettes, e-cigarettes, and chewing tobacco. These can delay bone healing. If you need help quitting, ask your health care provider.  Do not drive for 24 hours if you were given a sedative during your procedure.  Keep all follow-up visits as told by your health care provider. This is important. Contact a health care provider if:  You have a fever or chills.  You have redness, swelling, or pain at the site of your puncture.  You have fluid, blood, or pus coming from the puncture site.  You have pain that gets  worse or does not get better with medicine.  You develop numbness or weakness in any part of your body. Get help right away if:  You have chest pain.  You have difficulty breathing.  You have weakness, numbness, or tingling in your legs.  You cannot control your bladder or bowel movements.  You suddenly become weak or numb on one side of your body.  You become very confused.  You have trouble speaking or understanding, or both. Summary  Follow instructions from your health care provider about  how to take care of your puncture site.  Take over-the-counter and prescription medicines only as told by your health care provider.  Rest your back and avoid intense physical activity for as long as told by your health care provider.  Contact a health care provider if you have pain that gets worse or does not get better with medicine.  Keep all follow-up visits as told by your health care provider. This is important. This information is not intended to replace advice given to you by your health care provider. Make sure you discuss any questions you have with your health care provider. Document Released: 01/14/2015 Document Revised: 04/02/2018 Document Reviewed: 04/02/2018 Elsevier Patient Education  2020 [employer].

## 2019-04-02 NOTE — H&P (Signed)
Chief Complaint: T9 Fracture  Referring Physician(s): Deveshwar,Sanjeev  Supervising Physician: Luanne Bras  Patient Status: [facility] - Out-pt  History of Present Illness: [name] [name] is a 83 y.o. female who is well known to our service.  She has undergone previous kyphoplasty/vertebral augmentation of T9 on 02/14/2019, T11 on 11/03/2017, and T12 on 09/29/2017.  She has always had good results.  Unfortunately right after T9 KP, she develped sudden pain with radiation to the right breast/rib area.  MRI done 03/13/2019 showed new mild peripheral compression deformities of the inferior endplate of T8 with associated bone marrow edema bilaterally.  She is here today for kyphoplasty of T8.  She feels well. No nausea/vomiting. No Fever/chills. ROS negative.   Past Medical History:  Diagnosis Date  . Anxiety    takes Xanax daily as needed  . Asthma    Albuterol daily as needed  . Blood transfusion without reported diagnosis   . CAD (coronary artery disease)    LHC 2003 with 40% pLAD, 30% mLAD, 100% D1 (moderate vessel), 100%  D2 (small vessel), 95% mid small OM2.  Pt had PTCA to D1;  D2 and OM2 small vessels and tx medically;  Last Myoview (2012): anterior infarct seen with scar, no ischemia. EF normal. Med Rx // Myoview (12/14):  Low risk; ant scar w/ peri-infarct ischemia; EF 55% with ant AK // Myoview 5/16: EF 55-65, ant, apical scar, no ischemia   . Cataract   . Chronic back pain    HNP, DDD, spinal stenosis, vertebral compression fractures.   . Chronic nausea    takes Zofran daily as needed.  normal gastric emptying study 03/2013  . CKD (chronic kidney disease), stage III 09/27/2017  . Constipation    felt to be functional. takes Senokot and Miralax daily.  treated with Linzess in past.   . DIVERTICULOSIS, COLON 08/28/2008   Qualifier: Diagnosis of  By: [name], [name], [name]    . Elevated LFTs 2015   probably med induced DILI, from Augmentin vs Pravastatin  . GERD  (gastroesophageal reflux disease)    hx of esophageal stricture   . Hiatal hernia    Paraesophageal hernias. s/p fundoplications in 0000000 and 04/2013  . History of bronchitis several of yrs ago  . History of colon polyps 03/2009, 03/2011   adenomatous colon polyps, no high grade dysplasia.   [name] History of vertigo    no meds  . Hyperlipidemia    takes Pravastatin daily  . Hypertension    takes Amlodipine,Metoprolol,and Losartan daily  . Nausea 03/2016  . Osteoporosis   . PONV (postoperative nausea and vomiting)    yrs ago  . Slow urinary stream    takes Rapaflo daily  . TIA (transient ischemic attack) 1995   no residual effects noted    Past Surgical History:  Procedure Laterality Date  . APPENDECTOMY     patient unsure of date  . BACK SURGERY    . BLADDER REPAIR    . CHOLECYSTECTOMY     Patient unsure of date.  . CORONARY ANGIOPLASTY  11/16/2001   1 stent  . ESOPHAGEAL MANOMETRY N/A 03/25/2013   Procedure: ESOPHAGEAL MANOMETRY (EM);  Surgeon: Inda Castle, MD;  Location: [facility] ENDOSCOPY;  Service: Endoscopy;  Laterality: N/A;  . ESOPHAGOGASTRODUODENOSCOPY N/A 03/15/2013   Procedure: ESOPHAGOGASTRODUODENOSCOPY (EGD);  Surgeon: Jerene Bears, MD;  Location: [facility] ENDOSCOPY;  Service: Endoscopy;  Laterality: N/A;  . ESOPHAGOGASTRODUODENOSCOPY N/A 12/25/2015   Procedure: ESOPHAGOGASTRODUODENOSCOPY (EGD);  Surgeon: Mallie Mussel  Evern Bio, MD;  Location: [place] ENDOSCOPY;  Service: Endoscopy;  Laterality: N/A;  . EYE SURGERY  11/2000   bilateral cataracts with lens implant  . FIXATION KYPHOPLASTY LUMBAR SPINE  X 2   "L3-4"  . HEMORRHOID SURGERY    . HIATAL HERNIA REPAIR  06/03/2011   Procedure: LAPAROSCOPIC REPAIR OF HIATAL HERNIA;  Surgeon: Adin Hector, MD;  Location: [facility] ORS;  Service: General;  Laterality: N/A;  . HIATAL HERNIA REPAIR N/A 04/24/2013   Procedure: LAPAROSCOPIC  REPAIR RECURRENT PARASOPHAGEAL HIATAL HERNIA WITH FUNDOPLICATION;  Surgeon: Adin Hector, MD;  Location: [facility]  ORS;  Service: General;  Laterality: N/A;  . INSERTION OF MESH N/A 04/24/2013   Procedure: INSERTION OF MESH;  Surgeon: Adin Hector, MD;  Location: [facility] ORS;  Service: General;  Laterality: N/A;  . IR KYPHO THORACIC WITH BONE BIOPSY  09/29/2017  . IR KYPHO THORACIC WITH BONE BIOPSY  11/03/2017  . IR KYPHO THORACIC WITH BONE BIOPSY  02/14/2019  . LAPAROSCOPIC LYSIS OF ADHESIONS N/A 04/24/2013   Procedure: LAPAROSCOPIC LYSIS OF ADHESIONS;  Surgeon: Adin Hector, MD;  Location: [facility] ORS;  Service: General;  Laterality: N/A;  . LAPAROSCOPIC NISSEN FUNDOPLICATION  [id number]   Procedure: LAPAROSCOPIC NISSEN FUNDOPLICATION;  Surgeon: Adin Hector, MD;  Location: [facility] ORS;  Service: General;  Laterality: N/A;  . LUMBAR FUSION  12/07/2015   Right L2-3 facetectomy, posterolateral fusion L2-3 with pedicle screws, rods, local bone graft, Vivigen cancellous chips. Fusion extended to the L1 level with pedicle screws and rods  . LUMBAR LAMINECTOMY/DECOMPRESSION MICRODISCECTOMY Right 06/26/2012   Procedure: LUMBAR LAMINECTOMY/DECOMPRESSION MICRODISCECTOMY;  Surgeon: Jessy Oto, MD;  Location: [place];  Service: Orthopedics;  Laterality: Right;  RIGHT L4-5 MICRODISCECTOMY  . LUMBAR LAMINECTOMY/DECOMPRESSION MICRODISCECTOMY N/A 05/29/2015   Procedure: RIGHT L2-3 MICRODISCECTOMY;  Surgeon: Jessy Oto, MD;  Location: [place];  Service: Orthopedics;  Laterality: N/A;  . TOTAL ABDOMINAL HYSTERECTOMY  1975   partial  . UPPER GASTROINTESTINAL ENDOSCOPY      Allergies: Ace inhibitors, Aspartame, Atorvastatin, Biaxin [clarithromycin], Daliresp [roflumilast], Erythromycin, and Levofloxacin  Medications: Prior to Admission medications   Medication Sig Start Date End Date Taking? Authorizing Provider  acetaminophen (TYLENOL) 500 MG tablet Take 1 tablet (500 mg total) by mouth every 6 (six) hours as needed. 03/18/18  Yes Bast, Traci A, NP  acetaminophen-codeine (TYLENOL #3) 300-30 MG tablet Take 1-2 tablets by mouth  every 8 (eight) hours as needed for moderate pain. 01/21/19  Yes Mcarthur Rossetti, MD  ALPRAZolam Duanne Moron) 0.5 MG tablet Take 1/2 tablet 1- 2 x /day ONLY if needed for Anxiety Attack &  limit to 5 days /week to avoid addiction (Last till June 23rd) 08/28/18  Yes [name], MD  amLODipine (NORVASC) 10 MG tablet Take 1 tablet Daily for BP & Heart 02/07/19  Yes [name], MD  aspirin EC 81 MG tablet Take 81 mg by mouth at bedtime.    Yes [provider]  BREO ELLIPTA 100-25 MCG/INH AEPB INHALE 1 PUFF BY MOUTH ONCE DAILY. RINSE MOUTH WITH WATER AFTER EACH USE. Patient taking differently: Inhale 1 puff into the lungs daily.  03/25/19  Yes [name], NP  Cholecalciferol (VITAMIN D3) 5000 units CAPS Take 5,000 Units by mouth 2 (two) times daily.    Yes [provider]  Coral Calcium 1000 (390 Ca) MG TABS Take 1,000 mg by mouth daily. 12/14/18  Yes [name], NP  denosumab (PROLIA) 60 MG/ML SOSY injection Inject  60 mg into the skin every 6 (six) months. 03/05/19  Yes Deveshwar, Abel Presto, MD  diclofenac sodium (VOLTAREN) 1 % GEL Apply 2 g topically 4 (four) times daily. 03/18/18  Yes Bast, Traci A, NP  gabapentin (NEURONTIN) 400 MG capsule Take 400 mg by mouth 3 (three) times daily. 09/17/18  Yes [provider]  HYDROcodone-acetaminophen (NORCO) 7.5-325 MG tablet Take 1 tablet by mouth every 6 (six) hours as needed for moderate pain. 03/26/19  Yes Jessy Oto, MD  ketoconazole (NIZORAL) 2 % cream  APPLY ONE APPLICATION TOPICALLY TWO TIMES DAILY Patient taking differently: Apply 1 application topically 2 (two) times daily.  04/02/18  Yes [name], MD  Magnesium 400 MG TABS Take 400 mg by mouth daily.   Yes [provider]  metoprolol tartrate (LOPRESSOR) 50 MG tablet Take 1 tablet 2 x /day for BP & Heart 12/23/18  Yes [name], MD  nitroGLYCERIN (NITROSTAT) 0.4 MG SL tablet Place 1 tablet (0.4 mg total) under the tongue every 5  (five) minutes as needed for chest pain. 09/04/14  Yes Larey Dresser, MD  omeprazole (PRILOSEC) 40 MG capsule Take 1 capsule daily  For  Heartburn, Reflux & Nausea 12/13/18  Yes [name], NP  polyethylene glycol powder (GLYCOLAX/MIRALAX) powder Take 17 g by mouth 2 (two) times daily. 05/16/17  Yes Armbruster, Carlota Raspberry, MD  tiZANidine (ZANAFLEX) 4 MG tablet TAKE 1 TABLET BY MOUTH EVERY 8 HOURS AS NEEDED FOR MUSCLE SPASM Patient taking differently: Take 4 mg by mouth every 8 (eight) hours as needed for muscle spasms.  03/25/19  Yes Mcarthur Rossetti, MD  ipratropium-albuterol (DUONEB) 0.5-2.5 (3) MG/3ML SOLN Take 3 mLs by nebulization every 6 (six) hours as needed (shortness of breath/wheezing).     [provider]  meclizine (ANTIVERT) 25 MG tablet Take 1/2 to 1 tablet 2 to 3 x /day ONLY if needed  For Dizziness / Vertigo 06/28/18   [name], MD  ondansetron (ZOFRAN) 4 MG tablet Take 1 tablet (4 mg total) by mouth every 8 (eight) hours as needed for nausea or vomiting. 05/16/18   Thurnell Lose, MD  pravastatin (PRAVACHOL) 40 MG tablet Take 40 mg by mouth daily. 12/25/18   [provider]  promethazine (PHENERGAN) 25 MG tablet TAKE 1 TABLET BY MOUTH EVERY 6 HOURS AS NEEDED FOR NAUSEA AND VOMITING Patient taking differently: Take 25 mg by mouth every 6 (six) hours as needed for nausea or vomiting.  05/28/18   Vicie Mutters, PA-C  senna-docusate (SENOKOT-S) 8.6-50 MG tablet Take 2 tablets by mouth 2 (two) times daily. Patient taking differently: Take 1 tablet by mouth 2 (two) times daily.  12/26/15   Roxan Hockey, MD  sucralfate (CARAFATE) 1 g tablet Take 1 tablet 2 to 4 x /day before meals for Heartburn Patient taking differently: Take 1 g by mouth 3 (three) times daily before meals.  04/17/18   [name], MD     Family History  Problem Relation Age of Onset  . Colon cancer Mother 66  . Heart attack Father 72       heart attack  . Brain cancer  Brother        tumor   . Heart disease Brother   . Heart disease Brother   . Kidney disease Daughter   . Esophageal cancer Neg Hx   . Stomach cancer Neg Hx     Social History   Socioeconomic History  . Marital status: Married  Spouse name: [name]  . Number of children: 2  . Years of education: 32  . Highest education level: Not on file  Occupational History  . Occupation: Retired    Fish farm manager: RETIRED    Comment: worked at [employer]  . Financial resource strain: Not on file  . Food insecurity    Worry: Not on file    Inability: Not on file  . Transportation needs    Medical: Not on file    Non-medical: Not on file  Tobacco Use  . Smoking status: Never Smoker  . Smokeless tobacco: Never Used  Substance and Sexual Activity  . Alcohol use: No  . Drug use: Never  . Sexual activity: Not on file  Lifestyle  . Physical activity    Days per week: Not on file    Minutes per session: Not on file  . Stress: Not on file  Relationships  . Social Herbalist on phone: Not on file    Gets together: Not on file    Attends religious service: Not on file    Active member of club or organization: Not on file    Attends meetings of clubs or organizations: Not on file    Relationship status: Not on file  Other Topics Concern  . Not on file  Social History Narrative   Lives with husband.   Right-handed.   3 cups caffeine per day.     Review of Systems: A 12 point ROS discussed and pertinent positives are indicated in the HPI above.  All other systems are negative.  Review of Systems  Vital Signs: BP (!) 141/69   Pulse 64   Temp 97.9 F (36.6 C) (Oral)   Resp 18   Ht 5' 3.5" (1.613 m)   Wt 76.7 kg   SpO2 98%   BMI 29.47 kg/m   Physical Exam Vitals signs reviewed.  Constitutional:      Appearance: Normal appearance.  HENT:     Head: Normocephalic and atraumatic.  Eyes:     Extraocular Movements: Extraocular movements intact.   Neck:     Musculoskeletal: Normal range of motion.  Cardiovascular:     Rate and Rhythm: Normal rate and regular rhythm.  Pulmonary:     Effort: Pulmonary effort is normal.     Breath sounds: Normal breath sounds.  Abdominal:     General: There is no distension.     Palpations: Abdomen is soft.  Musculoskeletal: Normal range of motion.     Thoracic back: She exhibits tenderness and pain.       Back:  Skin:    General: Skin is warm and dry.  Neurological:     General: No focal deficit present.     Mental Status: She is alert and oriented to person, place, and time.  Psychiatric:        Mood and Affect: Mood normal.        Behavior: Behavior normal.        Thought Content: Thought content normal.        Judgment: Judgment normal.     Imaging: Mr Thoracic Spine Wo Contrast  Result Date: 03/13/2019 CLINICAL DATA:  Follow up T9 fracture. Persistent back pain. History of spinal augmentation. Marrow biopsy negative for tumor. EXAM: MRI THORACIC SPINE WITHOUT CONTRAST TECHNIQUE: Multiplanar, multisequence MR imaging of the thoracic spine was performed. No intravenous contrast was administered. COMPARISON:  Radiographs 02/28/2019. Thoracic MRI 02/07/2019 and CT 01/31/2019. FINDINGS:  Alignment: Stable with a mild thoracic scoliosis and mild kyphosis at T12 and L1. In the cervical spine, there is mild chronic retrolisthesis at C5-6. Vertebrae: Interval spinal augmentation at T9. The underlying fracture has not significantly progressed, with marrow edema extending into the pedicles bilaterally. There is no osseous retropulsion. There are new mild peripheral compression deformities of the inferior endplate of T8 with associated bone marrow edema bilaterally. Chronic fractures at T11 and T12 are stable post spinal augmentation. There are additional chronic fractures in the lumbar spine from L1 through L4. Patient is status post posterior fusion from L1 through L4. Cord: The thoracic cord appears  normal in signal and caliber. Conus medullaris extends to the L1-2 level. Paraspinal and other soft tissues: Mild paraspinous edema surrounding the T9 compression fracture without focal hematoma. No new paraspinal abnormalities. Disc levels: No disc herniation, spinal stenosis or nerve root encroachment identified in the thoracic spine. Stable mild chronic osseous retropulsion at the T12 and L1 fractures without cord deformity or foraminal compromise. IMPRESSION: 1. Interval spinal augmentation at T9. No significant progression of the fracture or associated soft tissue mass. Biopsy at time of spinal augmentation was negative for tumor. 2. New mild peripheral compression deformities of the inferior endplate of T8 with associated bone marrow edema bilaterally. 3. Stable chronic fractures at T11, T12 and in the visualized lumbar spine. 4. No disc herniation, spinal stenosis or nerve root encroachment in the thoracic spine. Electronically Signed   By: Richardean Sale M.D.   On: 03/13/2019 13:46    Labs:  CBC: Recent Labs    02/14/19 0656 02/27/19 1109 03/18/19 1038 03/21/19 1028  WBC 10.8* 7.6 8.5 7.6  HGB 14.2 14.4 15.1 14.7  HCT 44.3 42.6 45.0 44.2  PLT 319 338 357 336    COAGS: Recent Labs    02/14/19 0656  INR 0.9  APTT 24    BMP: Recent Labs    12/13/18 1139 02/27/19 1109 03/18/19 1038 03/21/19 1028  NA 140 141 138 140  K 4.6 4.4 4.6 4.4  CL 101 103 104 103  CO2 29 28 24 27   GLUCOSE 95 99 135* 108*  BUN 15 18 17 13   CALCIUM 10.8* 9.3 10.0 9.7  CREATININE 0.84 0.94* 1.00* 0.89*  GFRNONAA 65 57* 53* 61  GFRAA 76 66 61 70    LIVER FUNCTION TESTS: Recent Labs    05/12/18 1025 05/13/18 1806 05/14/18 0544 05/15/18 0512  12/13/18 1139 02/27/19 1109 03/18/19 1038 03/21/19 1028  BILITOT 0.7 1.0 1.3* 1.2   < > 0.6 0.5 0.6 0.5  AST 23 27 22 18    < > 18 19 23 18   ALT 19 22 21 18    < > 20 20 18 16   ALKPHOS 52 48 43 37*  --   --   --   --   --   PROT 7.5 7.6 6.2* 5.7*    < > 7.8 6.7 6.5 6.7  ALBUMIN 4.1 4.2 3.4* 3.0*  --   --   --   --   --    < > = values in this interval not displayed.    TUMOR MARKERS: No results for input(s): AFPTM, CEA, CA199, CHROMGRNA in the last 8760 hours.  Assessment and Plan:  History of previous vertebral augmentations.  New T8 fracture.  Will proceed with T8 vertebral augmentation today by Dr.Deveshwar.  Risks and benefits of T8 kyphoplasty were discussed with the patient including, but not limited to education regarding the natural  healing process of compression fractures without intervention, bleeding, infection, cement migration which may cause spinal cord damage, paralysis, pulmonary embolism or even death.  This interventional procedure involves the use of X-rays and because of the nature of the planned procedure, it is possible that we will have prolonged use of X-ray fluoroscopy.  Potential radiation risks to you include (but are not limited to) the following: - A slightly elevated risk for cancer  several years later in life. This risk is typically less than 0.5% percent. This risk is low in comparison to the normal incidence of human cancer, which is 33% for women and 50% for men according to the [place]. - Radiation induced injury can include skin redness, resembling a rash, tissue breakdown / ulcers and hair loss (which can be temporary or permanent).   The likelihood of either of these occurring depends on the difficulty of the procedure and whether you are sensitive to radiation due to previous procedures, disease, or genetic conditions.   IF your procedure requires a prolonged use of radiation, you will be notified and given written instructions for further action.  It is your responsibility to monitor the irradiated area for the 2 weeks following the procedure and to notify your physician if you are concerned that you have suffered a radiation induced injury.    All of the patient's questions  were answered, patient is agreeable to proceed.  Consent signed and in chart.  Thank you for this interesting consult.  I greatly enjoyed meeting KEVIA LOBATOS and look forward to participating in their care.  A copy of this report was sent to the requesting provider on this date.  Electronically Signed: Murrell Redden, PA-C   04/02/2019, 7:23 AM      I spent a total of    25 Minutes in face to face in clinical consultation, greater than 50% of which was counseling/coordinating care for vertebral augmentation.

## 2019-04-03 ENCOUNTER — Other Ambulatory Visit: Payer: Self-pay | Admitting: Orthopaedic Surgery

## 2019-04-03 ENCOUNTER — Other Ambulatory Visit: Payer: Self-pay | Admitting: Specialist

## 2019-04-03 NOTE — Telephone Encounter (Signed)
Patient called to request an RX refill on her Hydrocodone.  Patient stated that she will not have enough to last until Monday.  Patient uses [facility] on [address].  CB#[phone].  Thank you.

## 2019-04-03 NOTE — Telephone Encounter (Signed)
Please advise 

## 2019-04-03 NOTE — Telephone Encounter (Signed)
Sent re [facility] to Dr. Louanne Skye for refill

## 2019-04-04 ENCOUNTER — Other Ambulatory Visit: Payer: Self-pay

## 2019-04-08 ENCOUNTER — Other Ambulatory Visit: Payer: Self-pay | Admitting: Specialist

## 2019-04-08 ENCOUNTER — Other Ambulatory Visit: Payer: Self-pay | Admitting: Orthopedic Surgery

## 2019-04-08 MED ORDER — HYDROCODONE-ACETAMINOPHEN 7.5-325 MG PO TABS: 1.0000 | ORAL_TABLET | Freq: Four times a day (QID) | ORAL | 0 refills | Status: DC | PRN

## 2019-04-08 NOTE — Telephone Encounter (Signed)
Pt called in requesting a refill on Hydrocodone, Please have that sent to [facility] on [address].  [phone]

## 2019-04-08 NOTE — Addendum Note (Signed)
Addended by: Minda Ditto, Alyse Low N on: 04/08/2019 04:05 PM   Modules accepted: Orders

## 2019-04-09 ENCOUNTER — Other Ambulatory Visit: Payer: Self-pay | Admitting: Specialist

## 2019-04-09 ENCOUNTER — Encounter (HOSPITAL_COMMUNITY): Payer: Self-pay | Admitting: Interventional Radiology

## 2019-04-09 ENCOUNTER — Other Ambulatory Visit: Payer: Self-pay | Admitting: Internal Medicine

## 2019-04-09 ENCOUNTER — Other Ambulatory Visit: Payer: Self-pay | Admitting: Physician Assistant

## 2019-04-09 DIAGNOSIS — R450 Nervousness: Secondary | ICD-10-CM

## 2019-04-09 LAB — SURGICAL PATHOLOGY

## 2019-04-09 MED ORDER — ALPRAZOLAM 0.5 MG PO TABS: ORAL_TABLET | ORAL | 0 refills | Status: DC

## 2019-04-12 ENCOUNTER — Other Ambulatory Visit: Payer: Self-pay | Admitting: Physician Assistant

## 2019-04-18 ENCOUNTER — Other Ambulatory Visit: Payer: Self-pay | Admitting: Internal Medicine

## 2019-04-18 ENCOUNTER — Other Ambulatory Visit: Payer: Self-pay | Admitting: *Deleted

## 2019-04-18 DIAGNOSIS — K219 Gastro-esophageal reflux disease without esophagitis: Secondary | ICD-10-CM

## 2019-04-18 DIAGNOSIS — Z79899 Other long term (current) drug therapy: Secondary | ICD-10-CM

## 2019-04-18 NOTE — Patient Outreach (Signed)
[facility]) Care Management  04/18/2019  [name] [name] 28-Jan-1936 [id number]   Case reviewed, no patient outreach needed,  [name], [name]. Annia Friendly, BSN, [place] Management [facility] Telephonic CM Phone: [phone] Fax: [phone]

## 2019-04-19 ENCOUNTER — Other Ambulatory Visit: Payer: Self-pay | Admitting: Orthopaedic Surgery

## 2019-04-19 ENCOUNTER — Telehealth: Payer: Self-pay | Admitting: Specialist

## 2019-04-19 MED ORDER — HYDROCODONE-ACETAMINOPHEN 7.5-325 MG PO TABS: 1.0000 | ORAL_TABLET | Freq: Four times a day (QID) | ORAL | 0 refills | Status: DC | PRN

## 2019-04-19 NOTE — Telephone Encounter (Signed)
Ok to refill 

## 2019-04-19 NOTE — Telephone Encounter (Signed)
Can you advise since Dr. Louanne Skye is out of the office?

## 2019-04-19 NOTE — Telephone Encounter (Signed)
Patient called. She would like a refill on her hydrocodone. Her call back number is [phone]

## 2019-04-19 NOTE — Telephone Encounter (Signed)
Done. FYI 

## 2019-04-19 NOTE — Telephone Encounter (Signed)
I left voicemail for patient advising. 

## 2019-04-20 ENCOUNTER — Other Ambulatory Visit: Payer: Self-pay | Admitting: Orthopaedic Surgery

## 2019-04-20 ENCOUNTER — Other Ambulatory Visit: Payer: Self-pay | Admitting: Internal Medicine

## 2019-04-20 DIAGNOSIS — R450 Nervousness: Secondary | ICD-10-CM

## 2019-04-20 MED ORDER — ALPRAZOLAM 0.5 MG PO TABS: ORAL_TABLET | ORAL | 0 refills | Status: DC

## 2019-04-22 NOTE — Telephone Encounter (Signed)
Ok to refill 

## 2019-04-25 ENCOUNTER — Other Ambulatory Visit: Payer: Self-pay

## 2019-04-25 ENCOUNTER — Ambulatory Visit: Payer: Self-pay

## 2019-04-25 ENCOUNTER — Ambulatory Visit (INDEPENDENT_AMBULATORY_CARE_PROVIDER_SITE_OTHER): Payer: PPO | Admitting: Specialist

## 2019-04-25 ENCOUNTER — Encounter: Payer: Self-pay | Admitting: Specialist

## 2019-04-25 VITALS — BP 143/81 | HR 82 | Ht 61.25 in | Wt 173.0 lb

## 2019-04-25 DIAGNOSIS — S22060G Wedge compression fracture of T7-T8 vertebra, subsequent encounter for fracture with delayed healing: Secondary | ICD-10-CM

## 2019-04-25 DIAGNOSIS — R0781 Pleurodynia: Secondary | ICD-10-CM

## 2019-04-25 DIAGNOSIS — M8000XA Age-related osteoporosis with current pathological fracture, unspecified site, initial encounter for fracture: Secondary | ICD-10-CM

## 2019-04-25 MED ORDER — HYDROCODONE-ACETAMINOPHEN 5-325 MG PO TABS: 1.0000 | ORAL_TABLET | Freq: Four times a day (QID) | ORAL | 0 refills | Status: DC | PRN

## 2019-04-25 NOTE — Patient Instructions (Addendum)
Wrap the chest with 6 inch ACE wrap to splint the right rib fracture. May use under clothing. Expect the right rib pain will improve over the next 2-3 weeks. Usually takes about 6 weeks for a rib fracture to heal. I ordered a CT scan to assess the area of cement and assess the right rib wall for fracture and fracture healing. Ice to the right ribs and avoid bending and twisting. Deep breathe to keep lungs open and avoid developing a pneumonia due to use of a rib wrap that decreases your chest expansion.

## 2019-04-25 NOTE — Progress Notes (Signed)
Office Visit Note   Patient: [name] [name]           Date of Birth: [birth year]           MRN: [id number] Visit Date: 04/25/2019              Requested by: [name], [address] [place],  [place] [address] PCP: [name], MD   Assessment & Plan: Visit Diagnoses:  1. Rib pain   2. Rib pain on right side   3. Compression fracture of T8 vertebra with delayed healing, subsequent encounter   4. Osteoporosis with current pathological fracture, unspecified osteoporosis type, initial encounter     Plan:Wrap the chest with 6 inch ACE wrap to splint the right rib fracture. May use under clothing. Expect the right rib pain will improve over the next 2-3 weeks. Usually takes about 6 weeks for a rib fracture to heal. I ordered a CT scan to assess the area of cement and assess the right rib wall for fracture and fracture healing. Ice to the right ribs and avoid bending and twisting. Deep breathe to keep lungs open and avoid developing a pneumonia due to use of a rib wrap that decreases your chest expansion.   Follow-Up Instructions: Return in about 2 weeks (around 05/09/2019).   Orders:  Orders Placed This Encounter  Procedures  . XR Ribs Unilateral Right  . CT CHEST WO CONTRAST   Meds ordered this encounter  Medications  . HYDROcodone-acetaminophen (NORCO/VICODIN) 5-325 MG tablet    Sig: Take 1 tablet by mouth every 6 (six) hours as needed for moderate pain.    Dispense:  30 tablet    Refill:  0      Procedures: No procedures performed   Clinical Data: No additional findings.   Subjective: Chief Complaint  Patient presents with  . Chest Pain    83 year old female with severe osteoporosis with multiple compression fractures of the lumbar spine and dorsal spine with recent T8 kyphoplasty 04/02/2019 with excellent relief of pain. She has done well with little pain until about one week ago when she had onset of right rib pain. Pain is  present with pressure over the right lateral chest wall. The TLSO used for her T8 fracture  Is painful with pressure over the right ribs. No bowel or bladder changes. She is taking miralax to prevent constipation. Presently taking hydrocodone for pain. She is seeing Dr. Hardin Negus every 4 months. He had her on oxycodone and she has taken herself off this medication.    Review of Systems  Constitutional: Negative.   HENT: Negative.   Eyes: Positive for visual disturbance.  Respiratory: Positive for cough, shortness of breath and wheezing. Negative for apnea, choking, chest tightness and stridor.   Cardiovascular: Positive for chest pain. Negative for palpitations and leg swelling.  Gastrointestinal: Positive for nausea. Negative for abdominal distention, abdominal pain, anal bleeding, blood in stool, constipation, diarrhea, rectal pain and vomiting.  Endocrine: Negative.  Negative for cold intolerance, heat intolerance, polydipsia, polyphagia and polyuria.  Genitourinary: Negative.  Negative for difficulty urinating, dyspareunia, dysuria, enuresis and urgency.  Musculoskeletal: Positive for arthralgias and back pain. Negative for gait problem, joint swelling, myalgias, neck pain and neck stiffness.  Skin: Negative.  Negative for color change, pallor, rash and wound.  Allergic/Immunologic: Negative.  Negative for environmental allergies and immunocompromised state.  Neurological: Negative.  Negative for dizziness, tremors, seizures, syncope, facial asymmetry, speech difficulty, weakness, light-headedness, numbness  and headaches.  Hematological: Negative.  Negative for adenopathy. Does not bruise/bleed easily.  Psychiatric/Behavioral: Negative.  Negative for agitation, behavioral problems, confusion, decreased concentration, dysphoric mood, hallucinations, self-injury, sleep disturbance and suicidal ideas. The patient is not nervous/anxious and is not hyperactive.      Objective: Vital Signs: BP  (!) 143/81   Pulse 82   Physical Exam Constitutional:      Appearance: She is well-developed.  HENT:     Head: Normocephalic and atraumatic.  Eyes:     Pupils: Pupils are equal, round, and reactive to light.  Pulmonary:     Effort: Pulmonary effort is normal.     Breath sounds: Normal breath sounds.  Abdominal:     General: Bowel sounds are normal.     Palpations: Abdomen is soft.  Musculoskeletal:     Cervical back: Normal range of motion and neck supple.     Lumbar back: Negative right straight leg raise test and negative left straight leg raise test.  Skin:    General: Skin is warm and dry.  Neurological:     Mental Status: She is alert and oriented to person, place, and time.  Psychiatric:        Behavior: Behavior normal.        Thought Content: Thought content normal.        Judgment: Judgment normal.     Back Exam   Tenderness  The patient is experiencing tenderness in the thoracic.  Range of Motion  Extension: abnormal  Flexion: abnormal  Lateral bend right: abnormal  Lateral bend left: abnormal  Rotation right: abnormal  Rotation left: abnormal   Muscle Strength  Right Quadriceps:  5/5  Left Quadriceps:  5/5  Right Hamstrings:  5/5  Left Hamstrings:  5/5   Tests  Straight leg raise right: negative Straight leg raise left: negative  Reflexes  Patellar: 2/4 Achilles: 2/4 Babinski's sign: normal   Other  Toe walk: normal Heel walk: normal Sensation: normal Gait: normal  Erythema: no back redness Scars: absent  Comments:  Right rib pain worse with direct pressure right D8 or 9 level and extends from the mid clavicle level posterior chest wall to the right anteror chest wall in the area of the right breast.  Pain with cough or sneeze. There is pain with sleeping on the right side.       Specialty Comments:  No specialty comments available.  Imaging: No results found.   PMFS History: Patient Active Problem List   Diagnosis Date  Noted  . Nausea & vomiting 05/13/2018  . Abnormal glucose 03/01/2018  . FHx: heart disease 03/01/2018  . Therapeutic opioid-induced constipation (OIC) 12/21/2017  . Abnormal UGI series 12/21/2017  . CKD (chronic kidney disease) stage 3, GFR 30-59 ml/min 09/27/2017  . T12 compression fracture ([place]) 09/26/2017  . Chronic bronchitis ([place]) 08/09/2017  . Atherosclerosis of aorta ([place]) 04/05/2017  . Anxiety state 06/08/2016  . Spinal stenosis, lumbar region, with neurogenic claudication 05/29/2015  . DDD (degenerative disc disease), lumbar 04/21/2015  . Hyperlipidemia, mixed 11/24/2014  . Gastroesophageal reflux disease 11/24/2014  . Depression, major, in partial remission ([place]) 09/26/2014  . Chronic pain syndrome 09/26/2014  . Coronary artery disease due to lipid rich plaque   . Osteoporosis 02/10/2014  . Prediabetes 08/14/2013  . Vitamin D deficiency 08/14/2013  . MGUS (monoclonal gammopathy of unknown significance) 03/05/2013  . Vertebral compression fracture ([place]) 06/23/2012  . ESOPHAGEAL STRICTURE 08/28/2008  . Mild intermittent asthma without complication [id number]  .  Incarcerated recurrent paraesophageal hiatal hernia s/p lap redo repair [id number] 01/31/2008  . Essential hypertension 07/25/2007   Past Medical History:  Diagnosis Date  . Anxiety    takes Xanax daily as needed  . Asthma    Albuterol daily as needed  . Blood transfusion without reported diagnosis   . CAD (coronary artery disease)    LHC 2003 with 40% pLAD, 30% mLAD, 100% D1 (moderate vessel), 100%  D2 (small vessel), 95% mid small OM2.  Pt had PTCA to D1;  D2 and OM2 small vessels and tx medically;  Last Myoview (2012): anterior infarct seen with scar, no ischemia. EF normal. Med Rx // Myoview (12/14):  Low risk; ant scar w/ peri-infarct ischemia; EF 55% with ant AK // Myoview 5/16: EF 55-65, ant, apical scar, no ischemia   . Cataract   . Chronic back pain    HNP, DDD, spinal stenosis, vertebral compression  fractures.   . Chronic nausea    takes Zofran daily as needed.  normal gastric emptying study 03/2013  . CKD (chronic kidney disease), stage III 09/27/2017  . Constipation    felt to be functional. takes Senokot and Miralax daily.  treated with Linzess in past.   . DIVERTICULOSIS, COLON 08/28/2008   Qualifier: Diagnosis of  By: [name], [name], [name]    . Elevated LFTs 2015   probably med induced DILI, from Augmentin vs Pravastatin  . GERD (gastroesophageal reflux disease)    hx of esophageal stricture   . Hiatal hernia    Paraesophageal hernias. s/p fundoplications in 0000000 and 04/2013  . History of bronchitis several of yrs ago  . History of colon polyps 03/2009, 03/2011   adenomatous colon polyps, no high grade dysplasia.   [name] History of vertigo    no meds  . Hyperlipidemia    takes Pravastatin daily  . Hypertension    takes Amlodipine,Metoprolol,and Losartan daily  . Nausea 03/2016  . Osteoporosis   . PONV (postoperative nausea and vomiting)    yrs ago  . Slow urinary stream    takes Rapaflo daily  . TIA (transient ischemic attack) 1995   no residual effects noted    Family History  Problem Relation Age of Onset  . Colon cancer Mother 35  . Heart attack Father 104       heart attack  . Brain cancer Brother        tumor   . Heart disease Brother   . Heart disease Brother   . Kidney disease Daughter   . Esophageal cancer Neg Hx   . Stomach cancer Neg Hx     Past Surgical History:  Procedure Laterality Date  . APPENDECTOMY     patient unsure of date  . BACK SURGERY    . BLADDER REPAIR    . CHOLECYSTECTOMY     Patient unsure of date.  . CORONARY ANGIOPLASTY  11/16/2001   1 stent  . ESOPHAGEAL MANOMETRY N/A 03/25/2013   Procedure: ESOPHAGEAL MANOMETRY (EM);  Surgeon: Inda Castle, MD;  Location: [facility] END[facility]COPY;  Service: Endoscopy;  Laterality: N/A;  . ESOPHAGOGASTRODUODENOSCOPY N/A 03/15/2013   Procedure: ESOPHAGOGASTRODUODENOSCOPY (EGD);  Surgeon: Jerene Bears, MD;   Location: [facility] ENDOSCOPY;  Service: Endoscopy;  Laterality: N/A;  . ESOPHAGOGASTRODUODENOSCOPY N/A 12/25/2015   Procedure: ESOPHAGOGASTRODUODENOSCOPY (EGD);  Surgeon: Doran Stabler, MD;  Location: [facility] ENDOSCOPY;  Service: Endoscopy;  Laterality: N/A;  . EYE SURGERY  11/2000   bilateral cataracts with lens implant  . FIXATION KYPHOPLASTY  LUMBAR SPINE  X 2   "L3-4"  . HEMORRHOID SURGERY    . HIATAL HERNIA REPAIR  06/03/2011   Procedure: LAPAROSCOPIC REPAIR OF HIATAL HERNIA;  Surgeon: Adin Hector, MD;  Location: [facility] ORS;  Service: General;  Laterality: N/A;  . HIATAL HERNIA REPAIR N/A 04/24/2013   Procedure: LAPAROSCOPIC  REPAIR RECURRENT PARASOPHAGEAL HIATAL HERNIA WITH FUNDOPLICATION;  Surgeon: Adin Hector, MD;  Location: [facility];  Service: General;  Laterality: N/A;  . INSERTION OF MESH N/A 04/24/2013   Procedure: INSERTION OF MESH;  Surgeon: Adin Hector, MD;  Location: [facility];  Service: General;  Laterality: N/A;  . IR KYPHO THORACIC WITH BONE BIOPSY  09/29/2017  . IR KYPHO THORACIC WITH BONE BIOPSY  11/03/2017  . IR KYPHO THORACIC WITH BONE BIOPSY  02/14/2019  . IR KYPHO THORACIC WITH BONE BIOPSY  04/02/2019  . LAPAROSCOPIC LYSIS OF ADHESIONS N/A 04/24/2013   Procedure: LAPAROSCOPIC LYSIS OF ADHESIONS;  Surgeon: Adin Hector, MD;  Location: [facility];  Service: General;  Laterality: N/A;  . LAPAROSCOPIC NISSEN FUNDOPLICATION  [id number]   Procedure: LAPAROSCOPIC NISSEN FUNDOPLICATION;  Surgeon: Adin Hector, MD;  Location: [facility];  Service: General;  Laterality: N/A;  . LUMBAR FUSION  12/07/2015   Right L2-3 facetectomy, posterolateral fusion L2-3 with pedicle screws, rods, local bone graft, Vivigen cancellous chips. Fusion extended to the L1 level with pedicle screws and rods  . LUMBAR LAMINECTOMY/DECOMPRESSION MICRODISCECTOMY Right 06/26/2012   Procedure: LUMBAR LAMINECTOMY/DECOMPRESSION MICRODISCECTOMY;  Surgeon: Jessy Oto, MD;  Location: [place];  Service: Orthopedics;   Laterality: Right;  RIGHT L4-5 MICRODISCECTOMY  . LUMBAR LAMINECTOMY/DECOMPRESSION MICRODISCECTOMY N/A 05/29/2015   Procedure: RIGHT L2-3 MICRODISCECTOMY;  Surgeon: Jessy Oto, MD;  Location: [place];  Service: Orthopedics;  Laterality: N/A;  . TOTAL ABDOMINAL HYSTERECTOMY  1975   partial  . UPPER GASTROINTESTINAL ENDOSCOPY     Social History   Occupational History  . Occupation: Retired    Fish farm manager: RETIRED    Comment: worked at [employer]  . Smoking status: Never Smoker  . Smokeless tobacco: Never Used  Substance and Sexual Activity  . Alcohol use: No  . Drug use: Never  . Sexual activity: Not on file

## 2019-04-29 ENCOUNTER — Ambulatory Visit
Admission: RE | Admit: 2019-04-29 | Discharge: 2019-04-29 | Disposition: A | Discharge status: Home / Self Care | Payer: PPO | Source: Ambulatory Visit | Attending: Specialist | Admitting: Specialist

## 2019-04-29 ENCOUNTER — Emergency Department (HOSPITAL_COMMUNITY)
Admission: EM | Admit: 2019-04-29 | Discharge: 2019-04-29 | Discharge status: Against Medical Advice | Payer: PPO | Attending: Emergency Medicine | Admitting: Emergency Medicine

## 2019-04-29 ENCOUNTER — Encounter (HOSPITAL_COMMUNITY): Payer: Self-pay

## 2019-04-29 ENCOUNTER — Ambulatory Visit: Payer: PPO | Admitting: Adult Health

## 2019-04-29 ENCOUNTER — Other Ambulatory Visit: Payer: Self-pay

## 2019-04-29 DIAGNOSIS — K449 Diaphragmatic hernia without obstruction or gangrene: Secondary | ICD-10-CM | POA: Diagnosis not present

## 2019-04-29 DIAGNOSIS — Z5321 Procedure and treatment not carried out due to patient leaving prior to being seen by health care provider: Secondary | ICD-10-CM | POA: Diagnosis not present

## 2019-04-29 DIAGNOSIS — S22060G Wedge compression fracture of T7-T8 vertebra, subsequent encounter for fracture with delayed healing: Secondary | ICD-10-CM

## 2019-04-29 DIAGNOSIS — R0781 Pleurodynia: Secondary | ICD-10-CM

## 2019-04-29 DIAGNOSIS — R1031 Right lower quadrant pain: Secondary | ICD-10-CM | POA: Diagnosis present

## 2019-04-29 LAB — LIPASE, BLOOD: Lipase: 15 U/L (ref 11–51)

## 2019-04-29 LAB — COMPREHENSIVE METABOLIC PANEL
ALT: 18 U/L (ref 0–44)
AST: 22 U/L (ref 15–41)
Albumin: 4.4 g/dL (ref 3.5–5.0)
Alkaline Phosphatase: 63 U/L (ref 38–126)
Anion gap: 12 (ref 5–15)
BUN: 15 mg/dL (ref 8–23)
CO2: 21 mmol/L — ABNORMAL LOW (ref 22–32)
Calcium: 9.5 mg/dL (ref 8.9–10.3)
Chloride: 104 mmol/L (ref 98–111)
Creatinine, Ser: 0.79 mg/dL (ref 0.44–1.00)
GFR calc Af Amer: >60 mL/min (ref >60)
GFR calc non Af Amer: >60 mL/min (ref >60)
Glucose, Bld: 106 mg/dL — ABNORMAL HIGH (ref 70–99)
Potassium: 3.7 mmol/L (ref 3.5–5.1)
Sodium: 137 mmol/L (ref 135–145)
Total Bilirubin: 0.7 mg/dL (ref 0.3–1.2)
Total Protein: 7.7 g/dL (ref 6.5–8.1)

## 2019-04-29 LAB — CBC
HCT: 49.9 % — ABNORMAL HIGH (ref 36.0–46.0)
Hemoglobin: 15.8 g/dL — ABNORMAL HIGH (ref 12.0–15.0)
MCH: 31.1 pg (ref 26.0–34.0)
MCHC: 31.7 g/dL (ref 30.0–36.0)
MCV: 98.2 fL (ref 80.0–100.0)
Platelets: 301 10*3/uL (ref 150–400)
RBC: 5.08 MIL/uL (ref 3.87–5.11)
RDW: 13.2 % (ref 11.5–15.5)
WBC: 7.9 10*3/uL (ref 4.0–10.5)
nRBC: 0 % (ref 0.0–0.2)

## 2019-04-29 MED ORDER — SODIUM CHLORIDE 0.9% FLUSH: 3.0000 mL | Freq: Once | INTRAVENOUS | Status: DC

## 2019-04-29 NOTE — ED Triage Notes (Signed)
Patient c/o abdominal pain, bloating and right lower back and right flank pain. Patient states that she had a CT scan of her chest today.

## 2019-05-01 DIAGNOSIS — S2231XA Fracture of one rib, right side, initial encounter for closed fracture: Secondary | ICD-10-CM | POA: Diagnosis not present

## 2019-05-01 DIAGNOSIS — M4726 Other spondylosis with radiculopathy, lumbar region: Secondary | ICD-10-CM | POA: Diagnosis not present

## 2019-05-01 DIAGNOSIS — G894 Chronic pain syndrome: Secondary | ICD-10-CM | POA: Diagnosis not present

## 2019-05-01 DIAGNOSIS — M961 Postlaminectomy syndrome, not elsewhere classified: Secondary | ICD-10-CM | POA: Diagnosis not present

## 2019-05-06 ENCOUNTER — Other Ambulatory Visit: Payer: Self-pay | Admitting: Orthopaedic Surgery

## 2019-05-06 IMAGING — CT CT ABD-PELV W/ CM
2 of 5 series · 16 of 46 positions shown, 18 images · IV contrast (Omni 300)
Comparison: 11/08/2017

CLINICAL DATA: Pt c/o nausea and lower abdominal pain that radiates
to back, starting last night. Surgery: Part. Hysterectomy;
cholecystectomy; appendectomy

EXAM:
CT ABDOMEN AND PELVIS WITH CONTRAST
TECHNIQUE: Multidetector CT imaging of the abdomen and pelvis was performed
using the standard protocol following bolus administration of
intravenous contrast.
CONTRAST:  100mL OMNIPAQUE IOHEXOL 300 MG/ML  SOLN

[Series 3: a/p w/ 5mm · axial · 0.86mm/px · z∈[+878,+1278]mm · 13 of 90 slices shown, 15 images]
[im 5/90  soft-tissue]
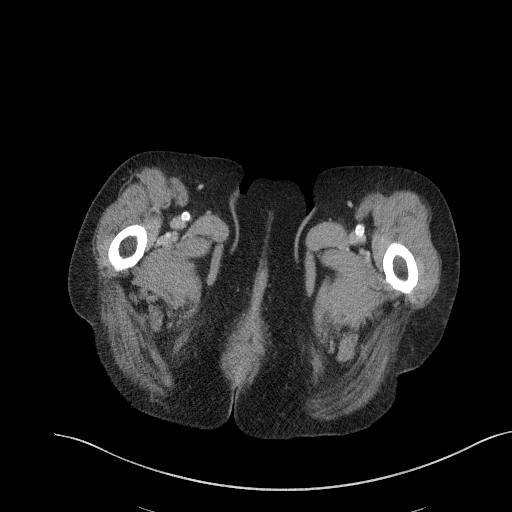
[im 5/90  bone]
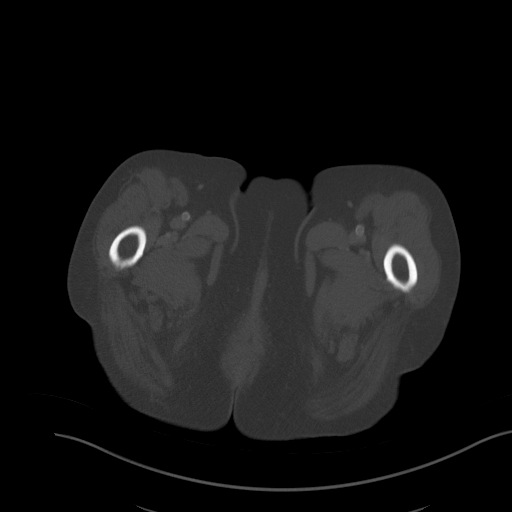
[im 14/90  soft-tissue]
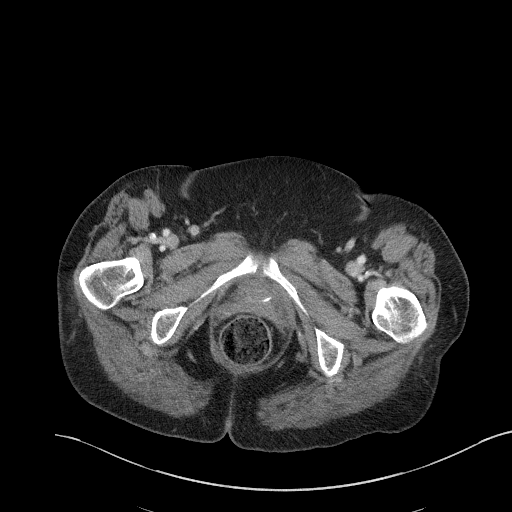
[im 18/90  soft-tissue]
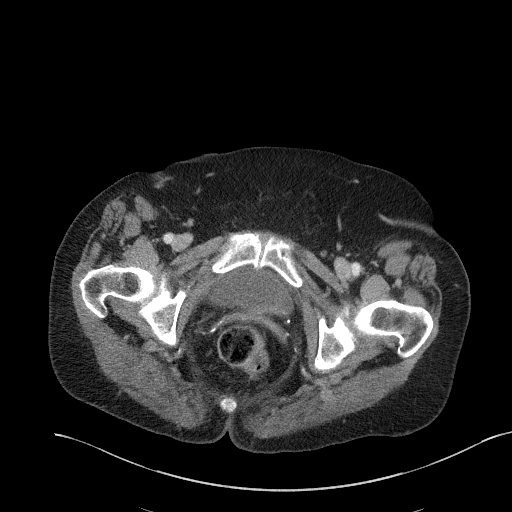
[im 27/90  soft-tissue]
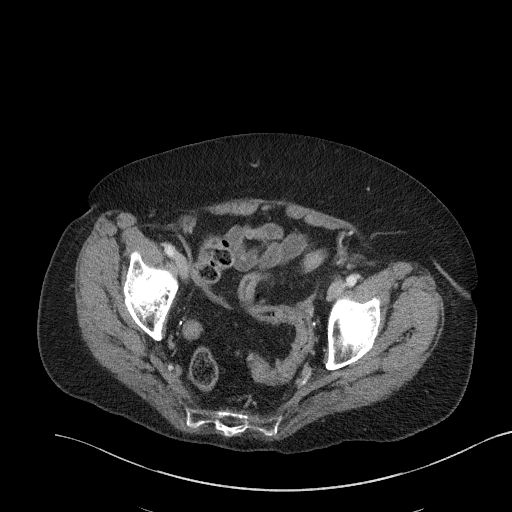
[im 32/90  soft-tissue]
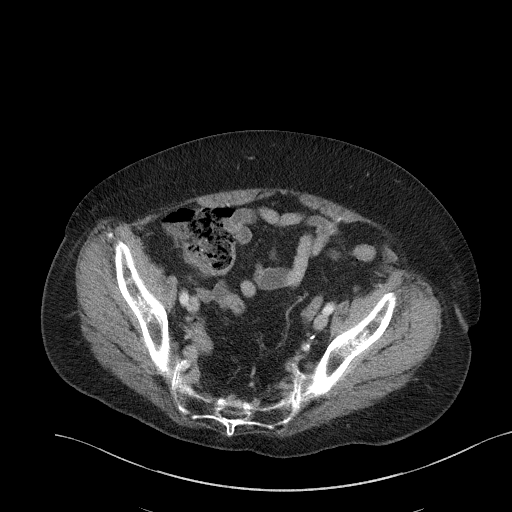
[im 41/90  soft-tissue]
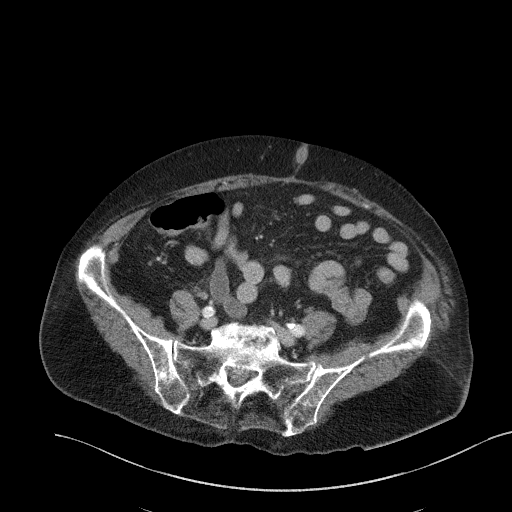
[im 45/90  soft-tissue]
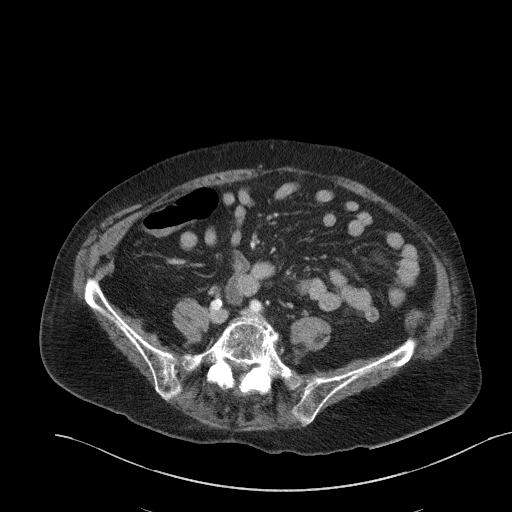
[im 49/90  soft-tissue]
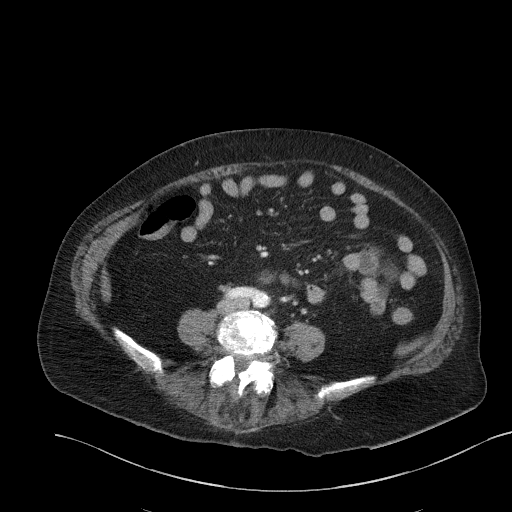
[im 58/90  soft-tissue]
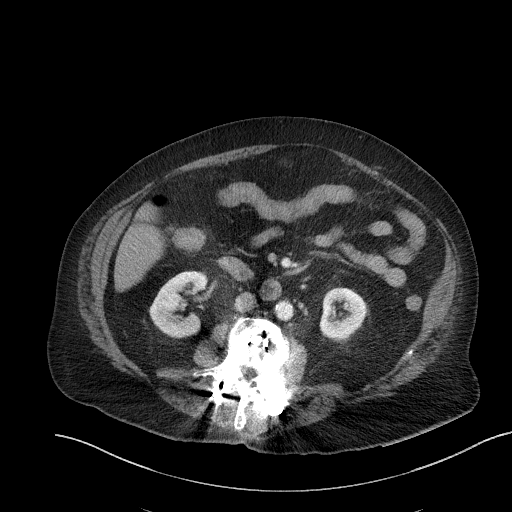
[im 58/90  bone]
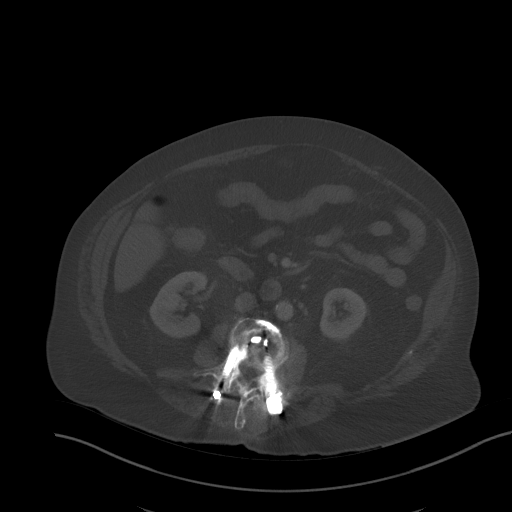
[im 63/90  soft-tissue]
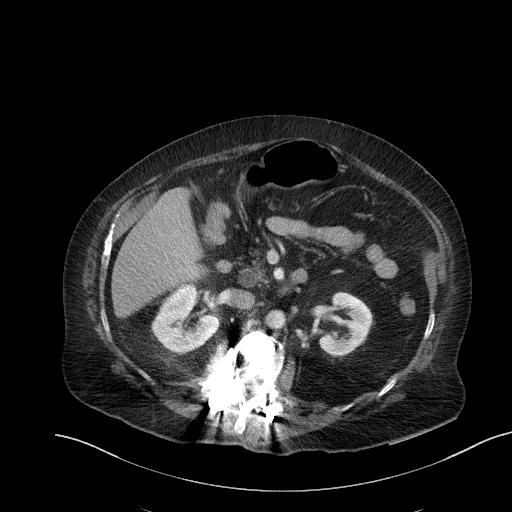
[im 72/90  soft-tissue]
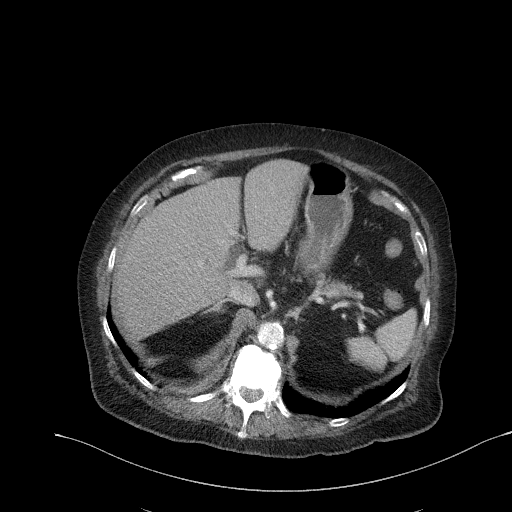
[im 76/90  soft-tissue]
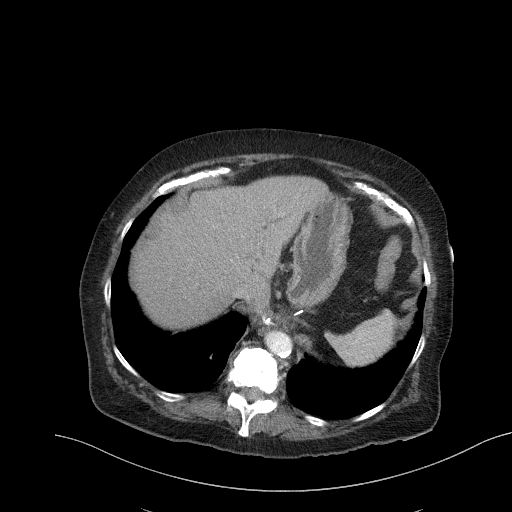
[im 85/90  soft-tissue]
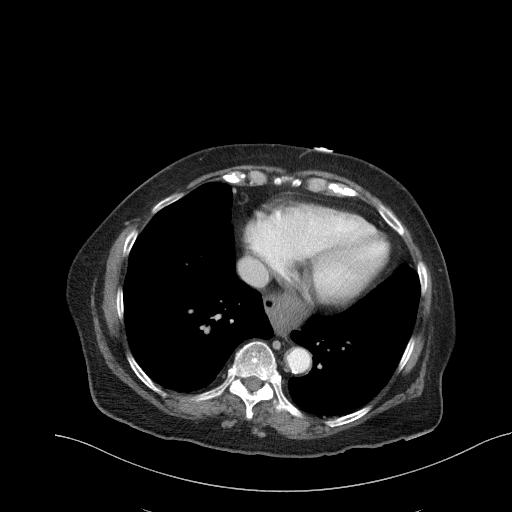

[Series 6: a/p w/ cor · coronal · 0.83mm/px · 3 of 151 slices shown]
[im 51/151  soft-tissue]
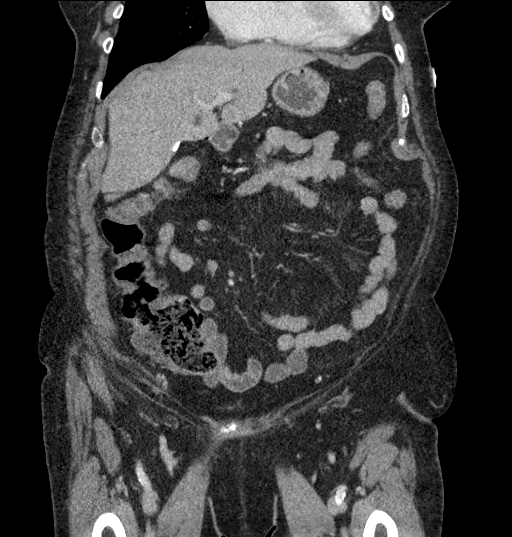
[im 67/151  soft-tissue]
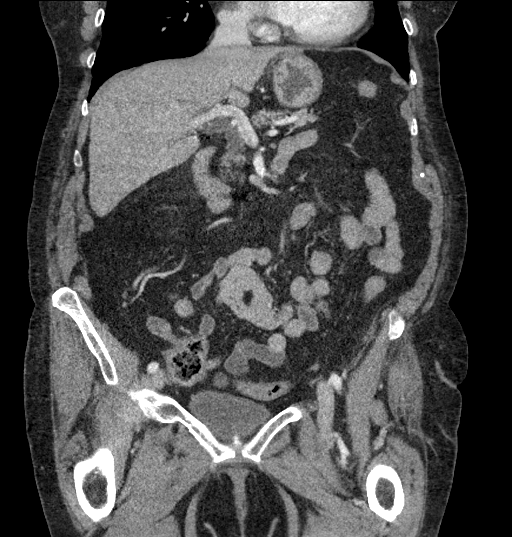
[im 84/151  soft-tissue]
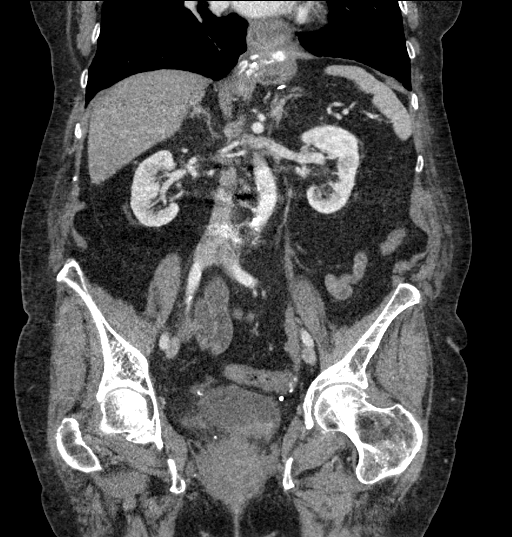

[16 of 46 positions shown; findings below may reference images not displayed]

FINDINGS: Lower chest: No acute abnormality.

Hepatobiliary: 7 mm cyst posterior aspect of segment 4B, stable. No
other liver masses or lesions. Liver normal in overall size.
Gallbladder surgically absent. There is intra and extrahepatic bile
duct dilation. Common bile duct measures 14 mm. This is chronic and
stable from the prior CT.

Pancreas: Unremarkable. No pancreatic ductal dilatation or
surrounding inflammatory changes.

Spleen: Normal in size without focal abnormality.

Adrenals/Urinary Tract: No adrenal masses. Kidneys normal in
position with symmetric enhancement and excretion. Mild renal
cortical thinning. 7 mm low-density lesion from the anterior upper
pole the left kidney consistent with a cyst. Tiny low-density
cortical lesion lateral midpole of the right kidney, also likely a
cyst. No other renal masses, no stones and no hydronephrosis. Normal
ureters. Normal bladder.

Stomach/Bowel: Stable changes from a hiatal hernia repair. Stomach
otherwise unremarkable. Small bowel and colon are normal in caliber.
No wall thickening or inflammation.

Vascular/Lymphatic: Aortic atherosclerosis. No enlarged abdominal or
pelvic lymph nodes.

Reproductive: Status post hysterectomy. No adnexal masses.

Other: No abdominal wall hernia or abnormality. No abdominopelvic
ascites.

Musculoskeletal: Chronic compression fractures of T11, T12, L1, L2,
L3 and L4. All but the L1 fracture have been treated with prior
vertebroplasty. Posterior fusion hardware at L1, L2 and L3. No acute
fracture. No evidence of loosening of the orthopedic hardware. No
osteoblastic or osteolytic lesions. There are changes of chronic
avascular necrosis of the right femoral head. When compared to the
prior CT, there has been no significant interval change.
IMPRESSION: 1. No acute findings.  No findings to account abdominal pain.
2. Chronic findings include chronic intra and extrahepatic bile duct
dilation, aortic atherosclerosis, and multiple vertebral fractures.

## 2019-05-06 NOTE — Telephone Encounter (Signed)
Nitka patient 

## 2019-05-08 ENCOUNTER — Telehealth: Payer: Self-pay | Admitting: Specialist

## 2019-05-08 NOTE — Telephone Encounter (Signed)
Pt called in said she had her mri done and was told by dr.nitka that he would call her with the results, pt said she is hurting a lot and is wondering if he has had a chance to look at the [facility]?  [phone]

## 2019-05-08 NOTE — Telephone Encounter (Signed)
Pt called in said she had her mri done and was told by dr.nitka that he would call her with the results, pt said she is hurting a lot and is wondering if he has had a chance to look at the mri?

## 2019-05-13 ENCOUNTER — Other Ambulatory Visit: Payer: Self-pay | Admitting: Specialist

## 2019-05-13 NOTE — Telephone Encounter (Signed)
Patient called needing Rx refilled Hydrocodone. Patient said she is out of her medication. The number to contact patient is [phone]

## 2019-05-14 ENCOUNTER — Ambulatory Visit (INDEPENDENT_AMBULATORY_CARE_PROVIDER_SITE_OTHER): Payer: PPO | Admitting: Adult Health Nurse Practitioner

## 2019-05-14 ENCOUNTER — Other Ambulatory Visit: Payer: Self-pay

## 2019-05-14 ENCOUNTER — Encounter: Payer: Self-pay | Admitting: Adult Health Nurse Practitioner

## 2019-05-14 VITALS — BP 120/64 | HR 67 | Temp 97.3°F | Wt 167.0 lb

## 2019-05-14 DIAGNOSIS — R140 Abdominal distension (gaseous): Secondary | ICD-10-CM

## 2019-05-14 DIAGNOSIS — K449 Diaphragmatic hernia without obstruction or gangrene: Secondary | ICD-10-CM | POA: Diagnosis not present

## 2019-05-14 DIAGNOSIS — Z79899 Other long term (current) drug therapy: Secondary | ICD-10-CM | POA: Diagnosis not present

## 2019-05-14 DIAGNOSIS — K222 Esophageal obstruction: Secondary | ICD-10-CM | POA: Diagnosis not present

## 2019-05-14 DIAGNOSIS — K219 Gastro-esophageal reflux disease without esophagitis: Secondary | ICD-10-CM | POA: Diagnosis not present

## 2019-05-14 DIAGNOSIS — K5903 Drug induced constipation: Secondary | ICD-10-CM

## 2019-05-14 MED ORDER — HYDROCODONE-ACETAMINOPHEN 5-325 MG PO TABS: 1.0000 | ORAL_TABLET | Freq: Four times a day (QID) | ORAL | 0 refills | Status: DC | PRN

## 2019-05-14 NOTE — Patient Instructions (Addendum)
   There is no evidence of shingles today.   Re-start taking your Carafate before every meal.  Try over the counter Gas-X tablet.  Increase your walking.  Do this after your pain medicine has kicked in.  Take Miralax twice a day and continue taking senokot twice a day.  Increase water intake.  Add a bottle of water making 5 bottles of water total.  Add prunes to your diet as well or small glass of prune juice.  Please contact office with any new or worsening symptoms.

## 2019-05-14 NOTE — Telephone Encounter (Signed)
Sent request to Dr. Nitka 

## 2019-05-14 NOTE — Progress Notes (Signed)
Assessment and Plan:  [name] [name] was seen today for abdominal pain and back pain. Discussed improving GI regiment of previously prescribed medications.  Likely related to not taking medications and complied back pain symptoms also decreasing mobility.  Diagnoses and all orders for this visit:  Abdominal bloating Discussed OTC Gas-X Discussed increasing walking when pain medication is working  Sliding hiatal hernia Incidental finding on CT Discussed small meals, eat slow  Gastroesophageal reflux disease, unspecified whether esophagitis present Doing well at this time Continue: Omeprazole 20m, get back to BID dosing as prescribed Diet discussed Monitor for triggers Avoid food with high acid content Avoid excessive cafeine Increase water intake Take Carafate 1g before meals and bedtime  ESOPHAGEAL STRICTURE Denies any difficulties eating foods or drinking liquids  Medication management Continued   Defer any lab studies today.  Has follow up for back in two days.  If symptoms persist or new symptoms, contact office for further evaluation.  Further disposition pending results of labs. Discussed med's effects and SE's.   Over 30 minutes of interview, exam, counseling, chart review, and critical decision making was performed.   Future Appointments  Date Time Provider DPenn Yan 06/27/2019 10:45 AM CLiane Comber NP [facility] None  08/20/2019 10:15 AM DBo Merino MD [facility] None  10/16/2019 10:00 AM MUnk Pinto MD [facility] None    ------------------------------------------------------------------------------------------------------------------   HPI 84y.o.female presents for evaluation of stomach pain and bloating she is unsure of timeline or origin.  She is accompanied by her husband today who also is unsure of the exact start.  She rates the pain a 6/10 that is constant.  LBM one day ago, formed.  She takes miralax once daily. Reports decreased appetite  related to the bloating.  She has not noticed any relationship to her discomfort and eating. She take  She denies any nausea or vomiting though she take half tablet of phenergan as needed for nausea which has been a chronic issue for her.  Denies constipation, diarrhea or excessive flatulence or burping.  Recent CT can shows sliding hiatal hernia.  She states knows she needs to loose weight.   Rash or few spots on her back that her husband noticed.  She was concerned about shingles.  She does take lyrica for chronic pain.  She denies any sensitivities or pain to her back.  Denies any rash or open areas, no itching, burning or tingling.    She has osteoporosis and has seen orthapedic reading right rib pain.  She had an X-ray on 02/28/19 suspicious for T9 fracture and persistent back pain, bone marrow biopsy negative for tumor.  CT on 04/29/19 and she has follow up 05/16/19 with Dr NLouanne Skyeto discuss results.   Past Medical History:  Diagnosis Date  . Anxiety    takes Xanax daily as needed  . Asthma    Albuterol daily as needed  . Blood transfusion without reported diagnosis   . CAD (coronary artery disease)    LHC 2003 with 40% pLAD, 30% mLAD, 100% D1 (moderate vessel), 100%  D2 (small vessel), 95% mid small OM2.  Pt had PTCA to D1;  D2 and OM2 small vessels and tx medically;  Last Myoview (2012): anterior infarct seen with scar, no ischemia. EF normal. Med Rx // Myoview (12/14):  Low risk; ant scar w/ peri-infarct ischemia; EF 55% with ant AK // Myoview 5/16: EF 55-65, ant, apical scar, no ischemia   . Cataract   . Chronic back pain    HNP,  DDD, spinal stenosis, vertebral compression fractures.   . Chronic nausea    takes Zofran daily as needed.  normal gastric emptying study 03/2013  . CKD (chronic kidney disease), stage III 09/27/2017  . Constipation    felt to be functional. takes Senokot and Miralax daily.  treated with Linzess in past.   . DIVERTICULOSIS, COLON 08/28/2008   Qualifier:  Diagnosis of  By: [name], [name], [name]    . Elevated LFTs 2015   probably med induced DILI, from Augmentin vs Pravastatin  . GERD (gastroesophageal reflux disease)    hx of esophageal stricture   . Hiatal hernia    Paraesophageal hernias. s/p fundoplications in 7672 and 04/2013  . History of bronchitis several of yrs ago  . History of colon polyps 03/2009, 03/2011   adenomatous colon polyps, no high grade dysplasia.   [name] History of vertigo    no meds  . Hyperlipidemia    takes Pravastatin daily  . Hypertension    takes Amlodipine,Metoprolol,and Losartan daily  . Nausea 03/2016  . Osteoporosis   . PONV (postoperative nausea and vomiting)    yrs ago  . Slow urinary stream    takes Rapaflo daily  . TIA (transient ischemic attack) 1995   no residual effects noted     Allergies  Allergen Reactions  . Ace Inhibitors Cough    Per Dr Melvyn Novas pulmonology 2013  . Aspartame Diarrhea and Nausea And Vomiting  . Atorvastatin Other (See Comments)    Muscle aches  . Biaxin [Clarithromycin] Other (See Comments)    Unknown reaction : PATIENT CAN TOLERATE Z-PAK.  Is written on patient's paper chart.  [name] [Roflumilast] Nausea And Vomiting  . Erythromycin Other (See Comments)    Unknown reaction: PATIENT CAN TOLERATE Z-PAK.  It is written on patient's paper chart.  . Levofloxacin Nausea And Vomiting    Current Outpatient Medications on File Prior to Visit  Medication Sig  . acetaminophen (TYLENOL) 500 MG tablet Take 1 tablet (500 mg total) by mouth every 6 (six) hours as needed.  [name] acetaminophen-codeine (TYLENOL #3) 300-30 MG tablet Take 1-2 tablets by mouth every 8 (eight) hours as needed for moderate pain.  [name] ALPRAZolam (XANAX) 0.5 MG tablet Take 1/2 tablet 1- 2 x /day ONLY if needed for Anxiety Attack &  limit to 5 days /week to avoid Addiction - DO NOT take with Hydrocodone or Codeine  . amLODipine (NORVASC) 10 MG tablet Take 1 tablet Daily for BP & Heart  . aspirin EC 81 MG tablet  Take 81 mg by mouth at bedtime.   [name] BREO ELLIPTA 100-25 MCG/INH AEPB INHALE 1 PUFF BY MOUTH ONCE DAILY. RINSE MOUTH WITH WATER AFTER EACH USE. (Patient taking differently: Inhale 1 puff into the lungs daily. )  . Cholecalciferol (VITAMIN D3) 5000 units CAPS Take 5,000 Units by mouth 2 (two) times daily.   [name] Coral Calcium 1000 (390 Ca) MG TABS Take 1,000 mg by mouth daily.  [name] denosumab (PROLIA) 60 MG/ML SOSY injection Inject 60 mg into the skin every 6 (six) months.  . diclofenac sodium (VOLTAREN) 1 % GEL Apply 2 g topically 4 (four) times daily.  [name] gabapentin (NEURONTIN) 400 MG capsule Take 400 mg by mouth 3 (three) times daily.  [name] HYDROcodone-acetaminophen (NORCO) 7.5-325 MG tablet Take 1 tablet by mouth every 6 (six) hours as needed for moderate pain.  [name] ipratropium-albuterol (DUONEB) 0.5-2.5 (3) MG/3ML SOLN Take 3 mLs by nebulization every 6 (six) hours as needed (shortness  of breath/wheezing).   [name] ketoconazole (NIZORAL) 2 % cream  APPLY ONE APPLICATION TOPICALLY TWO TIMES DAILY (Patient taking differently: Apply 1 application topically 2 (two) times daily. )  . Magnesium 400 MG TABS Take 400 mg by mouth daily.  . meclizine (ANTIVERT) 25 MG tablet Take 1/2 to 1 tablet 2 to 3 x /day ONLY if needed  For Dizziness / Vertigo  . metoprolol tartrate (LOPRESSOR) 50 MG tablet Take 1 tablet 2 x /day for BP & Heart  . nitroGLYCERIN (NITROSTAT) 0.4 MG SL tablet Place 1 tablet (0.4 mg total) under the tongue every 5 (five) minutes as needed for chest pain.  [name] omeprazole (PRILOSEC) 40 MG capsule Take 1 capsule 2 x /day for Indigestion , Heartburn & Nausea  . ondansetron (ZOFRAN) 4 MG tablet Take 1 tablet (4 mg total) by mouth every 8 (eight) hours as needed for nausea or vomiting.  . polyethylene glycol powder (GLYCOLAX/MIRALAX) powder Take 17 g by mouth 2 (two) times daily.  . pravastatin (PRAVACHOL) 40 MG tablet Take 40 mg by mouth daily.  . promethazine (PHENERGAN) 25 MG tablet Take 1 tablet every 4 hours  if needed for Nausea or Vomitting  . senna-docusate (SENOKOT-S) 8.6-50 MG tablet Take 2 tablets by mouth 2 (two) times daily. (Patient taking differently: Take 1 tablet by mouth 2 (two) times daily. )  . sucralfate (CARAFATE) 1 g tablet Take 1 tablet 2 to 4 x /day before meals for Heartburn (Patient taking differently: Take 1 g by mouth 3 (three) times daily before meals. )  . tiZANidine (ZANAFLEX) 4 MG tablet TAKE 1 TABLET BY MOUTH EVERY 8 HOURS AS NEEDED FOR MUSCLE SPASM  . HYDROcodone-acetaminophen (NORCO/VICODIN) 5-325 MG tablet Take 1 tablet by mouth every 6 (six) hours as needed for moderate pain.   Current Facility-Administered Medications on File Prior to Visit  Medication  . calcitonin (salmon) (MIACALCIN/FORTICAL) nasal spray 1 spray    ROS: all negative except above.   Physical Exam:  BP 120/64   Pulse 67   Temp (!) 97.3 F (36.3 C)   Wt 167 lb (75.8 kg)   SpO2 94%   BMI 29.12 kg/m   General Appearance: Well nourished, in no apparent distress. Eyes: PERRLA, EOMs, conjunctiva no swelling or erythema Sinuses: No Frontal/maxillary tenderness ENT/Mouth: Ext aud canals clear, TMs without erythema, bulging. No erythema, swelling, or exudate on post pharynx.  Tonsils not swollen or erythematous. Hearing normal.  Neck: Supple, thyroid normal.  Respiratory: Respiratory effort normal, BS equal bilaterally without rales, rhonchi, wheezing or stridor.  Cardio: RRR with no MRGs. Brisk peripheral pulses without edema.  Abdomen: Soft distended, + BS, hypoactive,  Non tender, no guarding, rebound, hernias, masses. Lymphatics: Non tender without lymphadenopathy.  Musculoskeletal: Full ROM, 5/5 strength, abnormal gait, ambulates with cane. Skin: Warm, dry without rashes, lesions, ecchymosis.  Neuro: Cranial nerves intact. Normal muscle tone, no cerebellar symptoms. Sensation intact.  Psych: Awake and oriented X 3, normal affect, Insight and Judgment appropriate.     [name],  NP 11:50 AM [facility]

## 2019-05-16 ENCOUNTER — Other Ambulatory Visit: Payer: Self-pay

## 2019-05-16 ENCOUNTER — Encounter: Payer: Self-pay | Admitting: Specialist

## 2019-05-16 ENCOUNTER — Ambulatory Visit (INDEPENDENT_AMBULATORY_CARE_PROVIDER_SITE_OTHER): Payer: PPO | Admitting: Specialist

## 2019-05-16 VITALS — BP 122/73 | HR 63 | Ht 61.5 in | Wt 173.0 lb

## 2019-05-16 DIAGNOSIS — S22000G Wedge compression fracture of unspecified thoracic vertebra, subsequent encounter for fracture with delayed healing: Secondary | ICD-10-CM

## 2019-05-16 DIAGNOSIS — I2583 Coronary atherosclerosis due to lipid rich plaque: Secondary | ICD-10-CM

## 2019-05-16 DIAGNOSIS — I2510 Atherosclerotic heart disease of native coronary artery without angina pectoris: Secondary | ICD-10-CM

## 2019-05-16 DIAGNOSIS — K440 Diaphragmatic hernia with obstruction, without gangrene: Secondary | ICD-10-CM | POA: Diagnosis not present

## 2019-05-16 NOTE — Progress Notes (Signed)
Office Visit Note   Patient: [name] [name]           Date of Birth: 1935-08-27           MRN: [id number] Visit Date: 05/16/2019              Requested by: [name], [address] [place],  [place] [address] PCP: [name], MD   Assessment & Plan: Visit Diagnoses:  1. Closed compression fracture of thoracic vertebra with delayed healing, subsequent encounter   2. Incarcerated recurrent paraesophageal hiatal hernia s/p lap redo repair [id number]   3. Coronary artery disease due to lipid rich plaque     Plan: Avoid frequent bending and stooping  No lifting greater than 10 lbs. May use ice or moist heat for pain. Weight loss is of benefit. . Exercise is important to improve your indurance and does allow people to function better inspite of back pain. The CT scan shows the spine is healed at the area of the fracture  But there is calcification of the blood vessels about the heart and areas of fat change within the heart suggests the back pain  Could relate to cardiac origin and also the hiatal hernia that was there previously is worsened. You have had previous hiatal hernia surgery. Need to see primary care or cardiologist due to the findings on the CT scan and discuss the chest pain and also see your GI specialist to see if the worsening hiatal hernia is the cause of some of the lower bilateral thoracic pain.  Follow-Up Instructions: Return in about 6 weeks (around 06/27/2019).   Orders:  No orders of the defined types were placed in this encounter.  No orders of the defined types were placed in this encounter.     Procedures: No procedures performed   Clinical Data: Findings:  CLINICAL DATA:  Right midthoracic and lower thoracic rib pain. T8 kyphoplasty 1 month ago.  EXAM: CT CHEST WITHOUT CONTRAST  TECHNIQUE: Multidetector CT imaging of the chest was performed following the standard protocol without IV contrast.  COMPARISON:   Thoracic spine MRI 03/13/2019. Rib radiographs 04/25/2019. Chest CTA 10/10/2017.  FINDINGS: Cardiovascular: Thoracic aortic atherosclerosis without aneurysm. LAD and left circumflex coronary artery atherosclerosis. Unchanged left ventricular subendocardial fat compatible with prior infarct. Lipomatous hypertrophy of the interatrial septum. No pericardial effusion.  Mediastinum/Nodes: No enlarged axillary, mediastinal, or hilar lymph nodes within limitations of noncontrast technique. Moderate-sized sliding hiatal hernia, larger than on the prior study and with postoperative changes again seen related to prior hernia repair. Unremarkable thyroid.  Lungs/Pleura: No pleural effusion or pneumothorax. Increased atelectasis or scarring in the right greater than left lower lobes, right middle lobe, and lingula. No mass.  Upper Abdomen: Unchanged subcentimeter hypodense lesion in hepatic segment IV compatible with a cyst. Status post cholecystectomy.  Musculoskeletal: Head status post augmentation of T8, T9, T11, and T12 compression fractures. No evidence of cement extravasation from the most recently treated T8 fracture. No new thoracic spine fracture or rib fracture identified.  IMPRESSION: 1. Status post augmentation of multiple lower thoracic spine compression fractures. No new spine or rib fracture identified. 2. Increased atelectasis or scarring in both lungs. 3. Moderate-sized sliding hiatal hernia. 4. Aortic Atherosclerosis (ICD10-I70.0).   Electronically Signed   By: Logan Bores M.D.   On: 04/29/2019 14:50    Subjective: Chief Complaint  Patient presents with  . Chest - Fracture    CT review of Chest  84 year old female with pain in the back in the mid thorax with history of T8, T9 and T11 and T12 vertebral augmentation Recent worsening of pain into the ribs bilaterally and pain with inspiration. She underwent a recent CT of chest to assess for rib    Fracture of new compression fracture of the spine.    Review of Systems  Constitutional: Negative.  Negative for activity change, appetite change, chills, diaphoresis, fatigue, fever and unexpected weight change.  HENT: Negative.  Negative for congestion, dental problem, drooling, ear discharge, ear pain, facial swelling, hearing loss, mouth sores, nosebleeds, postnasal drip, rhinorrhea, sinus pressure, sinus pain, sneezing, sore throat, tinnitus, trouble swallowing and voice change.   Eyes: Negative.  Negative for photophobia, pain, discharge, redness, itching and visual disturbance.  Respiratory: Positive for shortness of breath and wheezing. Negative for apnea, cough, choking, chest tightness and stridor.   Cardiovascular: Negative.  Negative for chest pain, palpitations and leg swelling.  Gastrointestinal: Positive for nausea.  Endocrine: Negative.   Genitourinary: Negative for difficulty urinating, dyspareunia, dysuria, enuresis, flank pain, frequency, genital sores and hematuria.  Musculoskeletal: Positive for back pain and gait problem. Negative for arthralgias, joint swelling, myalgias, neck pain and neck stiffness.  Skin: Negative.   Allergic/Immunologic: Negative.   Neurological: Negative for dizziness, tremors, seizures, syncope, facial asymmetry, speech difficulty, light-headedness, numbness and headaches.  Hematological: Negative.   Psychiatric/Behavioral: Negative.      Objective: Vital Signs: BP 122/73 (BP Location: Left Arm, Patient Position: Sitting)   Pulse 63   Ht 5' 1.5" (1.562 m)   Wt 173 lb (78.5 kg)   BMI 32.16 kg/m   Physical Exam Constitutional:      Appearance: She is well-developed.  HENT:     Head: Normocephalic and atraumatic.  Eyes:     Pupils: Pupils are equal, round, and reactive to light.  Pulmonary:     Effort: Pulmonary effort is normal.     Breath sounds: Normal breath sounds.  Abdominal:     General: Bowel sounds are normal.      Palpations: Abdomen is soft.  Musculoskeletal:        General: Normal range of motion.     Cervical back: Normal range of motion and neck supple.     Lumbar back: Negative right straight leg raise test and negative left straight leg raise test.  Skin:    General: Skin is warm and dry.  Neurological:     Mental Status: She is alert and oriented to person, place, and time.  Psychiatric:        Behavior: Behavior normal.        Thought Content: Thought content normal.        Judgment: Judgment normal.     Back Exam   Tenderness  The patient is experiencing tenderness in the lumbar.  Range of Motion  Extension: normal  Flexion: normal  Lateral bend right: normal  Lateral bend left: normal  Rotation right: normal  Rotation left: normal   Muscle Strength  Right Quadriceps:  5/5  Left Quadriceps:  5/5  Right Hamstrings:  5/5  Left Hamstrings:  5/5   Tests  Straight leg raise right: negative Straight leg raise left: negative  Reflexes  Patellar: normal Achilles: normal Biceps: normal Babinski's sign: normal   Other  Toe walk: normal Heel walk: normal Sensation: normal Gait: normal  Erythema: no back redness Scars: absent  Comments:  On prolia for bone strength.  Specialty Comments:  No specialty comments available.  Imaging: No results found.   PMFS History: Patient Active Problem List   Diagnosis Date Noted  . Nausea & vomiting 05/13/2018  . Abnormal glucose 03/01/2018  . FHx: heart disease 03/01/2018  . Therapeutic opioid-induced constipation (OIC) 12/21/2017  . Abnormal UGI series 12/21/2017  . CKD (chronic kidney disease) stage 3, GFR 30-59 ml/min 09/27/2017  . T12 compression fracture ([place]) 09/26/2017  . Chronic bronchitis ([place]) 08/09/2017  . Atherosclerosis of aorta ([place]) 04/05/2017  . Anxiety state 06/08/2016  . Spinal stenosis, lumbar region, with neurogenic claudication 05/29/2015  . DDD (degenerative disc disease), lumbar  04/21/2015  . Hyperlipidemia, mixed 11/24/2014  . Gastroesophageal reflux disease 11/24/2014  . Depression, major, in partial remission ([place]) 09/26/2014  . Chronic pain syndrome 09/26/2014  . Coronary artery disease due to lipid rich plaque   . Osteoporosis 02/10/2014  . Prediabetes 08/14/2013  . Vitamin D deficiency 08/14/2013  . MGUS (monoclonal gammopathy of unknown significance) 03/05/2013  . Vertebral compression fracture ([place]) 06/23/2012  . ESOPHAGEAL STRICTURE 08/28/2008  . Mild intermittent asthma without complication [id number]  . Incarcerated recurrent paraesophageal hiatal hernia s/p lap redo repair [id number] 01/31/2008  . Essential hypertension 07/25/2007   Past Medical History:  Diagnosis Date  . Anxiety    takes Xanax daily as needed  . Asthma    Albuterol daily as needed  . Blood transfusion without reported diagnosis   . CAD (coronary artery disease)    LHC 2003 with 40% pLAD, 30% mLAD, 100% D1 (moderate vessel), 100%  D2 (small vessel), 95% mid small OM2.  Pt had PTCA to D1;  D2 and OM2 small vessels and tx medically;  Last Myoview (2012): anterior infarct seen with scar, no ischemia. EF normal. Med Rx // Myoview (12/14):  Low risk; ant scar w/ peri-infarct ischemia; EF 55% with ant AK // Myoview 5/16: EF 55-65, ant, apical scar, no ischemia   . Cataract   . Chronic back pain    HNP, DDD, spinal stenosis, vertebral compression fractures.   . Chronic nausea    takes Zofran daily as needed.  normal gastric emptying study 03/2013  . CKD (chronic kidney disease), stage III 09/27/2017  . Constipation    felt to be functional. takes Senokot and Miralax daily.  treated with Linzess in past.   . DIVERTICULOSIS, COLON 08/28/2008   Qualifier: Diagnosis of  By: [name], [name], [name]    . Elevated LFTs 2015   probably med induced DILI, from Augmentin vs Pravastatin  . GERD (gastroesophageal reflux disease)    hx of esophageal stricture   . Hiatal hernia    Paraesophageal  hernias. s/p fundoplications in 0000000 and 04/2013  . History of bronchitis several of yrs ago  . History of colon polyps 03/2009, 03/2011   adenomatous colon polyps, no high grade dysplasia.   [name] History of vertigo    no meds  . Hyperlipidemia    takes Pravastatin daily  . Hypertension    takes Amlodipine,Metoprolol,and Losartan daily  . Nausea 03/2016  . Osteoporosis   . PONV (postoperative nausea and vomiting)    yrs ago  . Slow urinary stream    takes Rapaflo daily  . TIA (transient ischemic attack) 1995   no residual effects noted    Family History  Problem Relation Age of Onset  . Colon cancer Mother 50  . Heart attack Father 63       heart attack  . Brain cancer Brother  tumor   . Heart disease Brother   . Heart disease Brother   . Kidney disease Daughter   . Esophageal cancer Neg Hx   . Stomach cancer Neg Hx     Past Surgical History:  Procedure Laterality Date  . APPENDECTOMY     patient unsure of date  . BACK SURGERY    . BLADDER REPAIR    . CHOLECYSTECTOMY     Patient unsure of date.  . CORONARY ANGIOPLASTY  11/16/2001   1 stent  . ESOPHAGEAL MANOMETRY N/A 03/25/2013   Procedure: ESOPHAGEAL MANOMETRY (EM);  Surgeon: Inda Castle, MD;  Location: [facility] END[facility]COPY;  Service: Endoscopy;  Laterality: N/A;  . ESOPHAGOGASTRODUODENOSCOPY N/A 03/15/2013   Procedure: ESOPHAGOGASTRODUODENOSCOPY (EGD);  Surgeon: Jerene Bears, MD;  Location: [facility] ENDOSCOPY;  Service: Endoscopy;  Laterality: N/A;  . ESOPHAGOGASTRODUODENOSCOPY N/A 12/25/2015   Procedure: ESOPHAGOGASTRODUODENOSCOPY (EGD);  Surgeon: Doran Stabler, MD;  Location: [facility] ENDOSCOPY;  Service: Endoscopy;  Laterality: N/A;  . EYE SURGERY  11/2000   bilateral cataracts with lens implant  . FIXATION KYPHOPLASTY LUMBAR SPINE  X 2   "L3-4"  . HEMORRHOID SURGERY    . HIATAL HERNIA REPAIR  06/03/2011   Procedure: LAPAROSCOPIC REPAIR OF HIATAL HERNIA;  Surgeon: Adin Hector, MD;  Location: [facility];  Service:  General;  Laterality: N/A;  . HIATAL HERNIA REPAIR N/A 04/24/2013   Procedure: LAPAROSCOPIC  REPAIR RECURRENT PARASOPHAGEAL HIATAL HERNIA WITH FUNDOPLICATION;  Surgeon: Adin Hector, MD;  Location: [facility];  Service: General;  Laterality: N/A;  . INSERTION OF MESH N/A 04/24/2013   Procedure: INSERTION OF MESH;  Surgeon: Adin Hector, MD;  Location: [facility];  Service: General;  Laterality: N/A;  . IR KYPHO THORACIC WITH BONE BIOPSY  09/29/2017  . IR KYPHO THORACIC WITH BONE BIOPSY  11/03/2017  . IR KYPHO THORACIC WITH BONE BIOPSY  02/14/2019  . IR KYPHO THORACIC WITH BONE BIOPSY  04/02/2019  . LAPAROSCOPIC LYSIS OF ADHESIONS N/A 04/24/2013   Procedure: LAPAROSCOPIC LYSIS OF ADHESIONS;  Surgeon: Adin Hector, MD;  Location: [facility];  Service: General;  Laterality: N/A;  . LAPAROSCOPIC NISSEN FUNDOPLICATION  [id number]   Procedure: LAPAROSCOPIC NISSEN FUNDOPLICATION;  Surgeon: Adin Hector, MD;  Location: [facility];  Service: General;  Laterality: N/A;  . LUMBAR FUSION  12/07/2015   Right L2-3 facetectomy, posterolateral fusion L2-3 with pedicle screws, rods, local bone graft, Vivigen cancellous chips. Fusion extended to the L1 level with pedicle screws and rods  . LUMBAR LAMINECTOMY/DECOMPRESSION MICRODISCECTOMY Right 06/26/2012   Procedure: LUMBAR LAMINECTOMY/DECOMPRESSION MICRODISCECTOMY;  Surgeon: Jessy Oto, MD;  Location: [place];  Service: Orthopedics;  Laterality: Right;  RIGHT L4-5 MICRODISCECTOMY  . LUMBAR LAMINECTOMY/DECOMPRESSION MICRODISCECTOMY N/A 05/29/2015   Procedure: RIGHT L2-3 MICRODISCECTOMY;  Surgeon: Jessy Oto, MD;  Location: [place];  Service: Orthopedics;  Laterality: N/A;  . TOTAL ABDOMINAL HYSTERECTOMY  1975   partial  . UPPER GASTROINTESTINAL ENDOSCOPY     Social History   Occupational History  . Occupation: Retired    Fish farm manager: RETIRED    Comment: worked at [employer]  . Smoking status: Never Smoker  . Smokeless tobacco: Never Used    Substance and Sexual Activity  . Alcohol use: No  . Drug use: Never  . Sexual activity: Not on file

## 2019-05-16 NOTE — Patient Instructions (Signed)
Avoid frequent bending and stooping  No lifting greater than 10 lbs. May use ice or moist heat for pain. Weight loss is of benefit. . Exercise is important to improve your indurance and does allow people to function better inspite of back pain. The CT scan shows the spine is healed at the area of the fracture  But there is calcification of the blood vessels about the heart and areas of fat change within the heart suggests the back pain  Could relate to cardiac origin and also the hiatal hernia that was there previously is worsened. You have had previous hiatal hernia surgery. Need to see primary care or cardiologist due to the findings on the CT scan and discuss the chest pain and also see your GI specialist to see if the worsening hiatal hernia is the cause of some of the lower bilateral thoracic pain.

## 2019-05-20 ENCOUNTER — Other Ambulatory Visit: Payer: Self-pay | Admitting: Internal Medicine

## 2019-05-20 ENCOUNTER — Telehealth: Payer: Self-pay

## 2019-05-20 MED ORDER — GABAPENTIN 800 MG PO TABS: ORAL_TABLET | ORAL | 0 refills | Status: DC

## 2019-05-20 NOTE — Telephone Encounter (Signed)
Patient was recently seen in our office for right flank pain, pain under the right breast and radiating to her back. Questioning if its shingles internally. In a lot of pain.  Per Dr. Melford Aase, Gabapentin 800mg  tablets, take 1/2 - 1 tablet up to 3 times a day for pain.  Patient advised.

## 2019-05-21 ENCOUNTER — Encounter: Payer: Self-pay | Admitting: Cardiology

## 2019-05-21 ENCOUNTER — Ambulatory Visit (INDEPENDENT_AMBULATORY_CARE_PROVIDER_SITE_OTHER): Payer: PPO | Admitting: Cardiology

## 2019-05-21 ENCOUNTER — Other Ambulatory Visit: Payer: Self-pay

## 2019-05-21 VITALS — BP 132/70 | HR 170 | Ht 61.5 in | Wt 170.0 lb

## 2019-05-21 DIAGNOSIS — I10 Essential (primary) hypertension: Secondary | ICD-10-CM

## 2019-05-21 DIAGNOSIS — E785 Hyperlipidemia, unspecified: Secondary | ICD-10-CM

## 2019-05-21 DIAGNOSIS — I2510 Atherosclerotic heart disease of native coronary artery without angina pectoris: Secondary | ICD-10-CM

## 2019-05-21 DIAGNOSIS — I700 Atherosclerosis of aorta: Secondary | ICD-10-CM | POA: Diagnosis not present

## 2019-05-21 NOTE — Progress Notes (Signed)
Cardiology Office Note:    Date:  05/21/2019   ID:  [name] [name], DOB 1935-11-25, MRN [id number]  PCP:  [name], MD  Cardiologist:  Candee Furbish, MD  Electrophysiologist:  None   Referring MD: [name], MD   Here for preoperative clearance.  History of Present Illness:    [name] [name] is a 84 y.o. female with CAD post diagonal PTCA in 2003, old scar noted on nuclear stress test.   She does not describe any anginal symptoms currently.  She was sent over to me because a CT scan was recently performed of her chest demonstrated coronary atherosclerosis.  This is a known finding.  Ultimately, this is resulting in aggressive medical management which she is currently on.  Once again, she is not having any escalating symptoms.  I have seen her husband previously in the past.  I called him on the telephone to obtain further clarification of her history, [name] [name].  Doing well, no angina, no SOB, no syncope, no bleeding. Has experienced memory issues.   Past Medical History:  Diagnosis Date  . Anxiety    takes Xanax daily as needed  . Asthma    Albuterol daily as needed  . Blood transfusion without reported diagnosis   . CAD (coronary artery disease)    LHC 2003 with 40% pLAD, 30% mLAD, 100% D1 (moderate vessel), 100%  D2 (small vessel), 95% mid small OM2.  Pt had PTCA to D1;  D2 and OM2 small vessels and tx medically;  Last Myoview (2012): anterior infarct seen with scar, no ischemia. EF normal. Med Rx // Myoview (12/14):  Low risk; ant scar w/ peri-infarct ischemia; EF 55% with ant AK // Myoview 5/16: EF 55-65, ant, apical scar, no ischemia   . Cataract   . Chronic back pain    HNP, DDD, spinal stenosis, vertebral compression fractures.   . Chronic nausea    takes Zofran daily as needed.  normal gastric emptying study 03/2013  . CKD (chronic kidney disease), stage III 09/27/2017  . Constipation    felt to be functional. takes Senokot and Miralax daily.  treated  with Linzess in past.   . DIVERTICULOSIS, COLON 08/28/2008   Qualifier: Diagnosis of  By: [name], [name], [name]    . Elevated LFTs 2015   probably med induced DILI, from Augmentin vs Pravastatin  . GERD (gastroesophageal reflux disease)    hx of esophageal stricture   . Hiatal hernia    Paraesophageal hernias. s/p fundoplications in 0000000 and 04/2013  . History of bronchitis several of yrs ago  . History of colon polyps 03/2009, 03/2011   adenomatous colon polyps, no high grade dysplasia.   [name] History of vertigo    no meds  . Hyperlipidemia    takes Pravastatin daily  . Hypertension    takes Amlodipine,Metoprolol,and Losartan daily  . Nausea 03/2016  . Osteoporosis   . PONV (postoperative nausea and vomiting)    yrs ago  . Slow urinary stream    takes Rapaflo daily  . TIA (transient ischemic attack) 1995   no residual effects noted    Past Surgical History:  Procedure Laterality Date  . APPENDECTOMY     patient unsure of date  . BACK SURGERY    . BLADDER REPAIR    . CHOLECYSTECTOMY     Patient unsure of date.  . CORONARY ANGIOPLASTY  11/16/2001   1 stent  . ESOPHAGEAL MANOMETRY N/A 03/25/2013   Procedure: ESOPHAGEAL MANOMETRY (EM);  Surgeon: Inda Castle, MD;  Location: [facility];  Service: Endoscopy;  Laterality: N/A;  . ESOPHAGOGASTRODUODENOSCOPY N/A 03/15/2013   Procedure: ESOPHAGOGASTRODUODENOSCOPY (EGD);  Surgeon: Jerene Bears, MD;  Location[facility] END[facility]COPY;  Service: Endoscopy;  Laterality: N/A;  . ESOPHAGOGASTRODUODENOSCOPY N/A 12/25/2015   Procedure: ESOPHAGOGASTRODUODENOSCOPY (EGD);  Surgeon: Doran Stabler, MD;  Location: [facility] ENDOSCOPY;  Service: Endoscopy;  Laterality: N/A;  . EYE SURGERY  11/2000   bilateral cataracts with lens implant  . FIXATION KYPHOPLASTY LUMBAR SPINE  X 2   "L3-4"  . HEMORRHOID SURGERY    . HIATAL HERNIA REPAIR  06/03/2011   Procedure: LAPAROSCOPIC REPAIR OF HIATAL HERNIA;  Surgeon: Adin Hector, MD;  Location: [facility];  Service:  General;  Laterality: N/A;  . HIATAL HERNIA REPAIR N/A 04/24/2013   Procedure: LAPAROSCOPIC  REPAIR RECURRENT PARASOPHAGEAL HIATAL HERNIA WITH FUNDOPLICATION;  Surgeon: Adin Hector, MD;  Location: [facility];  Service: General;  Laterality: N/A;  . INSERTION OF MESH N/A 04/24/2013   Procedure: INSERTION OF MESH;  Surgeon: Adin Hector, MD;  Location: [facility];  Service: General;  Laterality: N/A;  . IR KYPHO THORACIC WITH BONE BIOPSY  09/29/2017  . IR KYPHO THORACIC WITH BONE BIOPSY  11/03/2017  . IR KYPHO THORACIC WITH BONE BIOPSY  02/14/2019  . IR KYPHO THORACIC WITH BONE BIOPSY  04/02/2019  . LAPAROSCOPIC LYSIS OF ADHESIONS N/A 04/24/2013   Procedure: LAPAROSCOPIC LYSIS OF ADHESIONS;  Surgeon: Adin Hector, MD;  Location: [facility];  Service: General;  Laterality: N/A;  . LAPAROSCOPIC NISSEN FUNDOPLICATION  [id number]   Procedure: LAPAROSCOPIC NISSEN FUNDOPLICATION;  Surgeon: Adin Hector, MD;  Location: [facility];  Service: General;  Laterality: N/A;  . LUMBAR FUSION  12/07/2015   Right L2-3 facetectomy, posterolateral fusion L2-3 with pedicle screws, rods, local bone graft, Vivigen cancellous chips. Fusion extended to the L1 level with pedicle screws and rods  . LUMBAR LAMINECTOMY/DECOMPRESSION MICRODISCECTOMY Right 06/26/2012   Procedure: LUMBAR LAMINECTOMY/DECOMPRESSION MICRODISCECTOMY;  Surgeon: Jessy Oto, MD;  Location: [place];  Service: Orthopedics;  Laterality: Right;  RIGHT L4-5 MICRODISCECTOMY  . LUMBAR LAMINECTOMY/DECOMPRESSION MICRODISCECTOMY N/A 05/29/2015   Procedure: RIGHT L2-3 MICRODISCECTOMY;  Surgeon: Jessy Oto, MD;  Location: [place];  Service: Orthopedics;  Laterality: N/A;  . TOTAL ABDOMINAL HYSTERECTOMY  1975   partial  . UPPER GASTROINTESTINAL ENDOSCOPY      Current Medications: Current Meds  Medication Sig  . acetaminophen (TYLENOL) 500 MG tablet Take 1 tablet (500 mg total) by mouth every 6 (six) hours as needed.  [name] acetaminophen-codeine (TYLENOL #3) 300-30 MG  tablet Take 1-2 tablets by mouth every 8 (eight) hours as needed for moderate pain.  [name] ALPRAZolam (XANAX) 0.5 MG tablet Take 1/2 tablet 1- 2 x /day ONLY if needed for Anxiety Attack &  limit to 5 days /week to avoid Addiction - DO NOT take with Hydrocodone or Codeine  . amLODipine (NORVASC) 10 MG tablet Take 1 tablet Daily for BP & Heart  . aspirin EC 81 MG tablet Take 81 mg by mouth at bedtime.   [name] BREO ELLIPTA 100-25 MCG/INH AEPB INHALE 1 PUFF BY MOUTH ONCE DAILY. RINSE MOUTH WITH WATER AFTER EACH USE.  [name] Cholecalciferol (VITAMIN D3) 5000 units CAPS Take 5,000 Units by mouth 2 (two) times daily.   [name] Coral Calcium 1000 (390 Ca) MG TABS Take 1,000 mg by mouth daily.  [name] denosumab (PROLIA) 60 MG/ML SOSY injection Inject 60 mg into the skin every  6 (six) months.  . diclofenac sodium (VOLTAREN) 1 % GEL Apply 2 g topically 4 (four) times daily.  [name] gabapentin (NEURONTIN) 800 MG tablet Take 1/2 to 1 tablet 3 x /day for Pain  . HYDROcodone-acetaminophen (NORCO) 7.5-325 MG tablet Take 1 tablet by mouth every 6 (six) hours as needed for moderate pain.  [name] HYDROcodone-acetaminophen (NORCO/VICODIN) 5-325 MG tablet Take 1 tablet by mouth every 6 (six) hours as needed for moderate pain.  [name] ipratropium-albuterol (DUONEB) 0.5-2.5 (3) MG/3ML SOLN Take 3 mLs by nebulization every 6 (six) hours as needed (shortness of breath/wheezing).   [name] ketoconazole (NIZORAL) 2 % cream  APPLY ONE APPLICATION TOPICALLY TWO TIMES DAILY  . Magnesium 400 MG TABS Take 400 mg by mouth daily.  . meclizine (ANTIVERT) 25 MG tablet Take 1/2 to 1 tablet 2 to 3 x /day ONLY if needed  For Dizziness / Vertigo  . metoprolol tartrate (LOPRESSOR) 50 MG tablet Take 1 tablet 2 x /day for BP & Heart  . nitroGLYCERIN (NITROSTAT) 0.4 MG SL tablet Place 1 tablet (0.4 mg total) under the tongue every 5 (five) minutes as needed for chest pain.  [name] omeprazole (PRILOSEC) 40 MG capsule Take 1 capsule 2 x /day for Indigestion , Heartburn & Nausea  .  ondansetron (ZOFRAN) 4 MG tablet Take 1 tablet (4 mg total) by mouth every 8 (eight) hours as needed for nausea or vomiting.  . polyethylene glycol powder (GLYCOLAX/MIRALAX) powder Take 17 g by mouth 2 (two) times daily.  . pravastatin (PRAVACHOL) 40 MG tablet Take 40 mg by mouth daily.  . promethazine (PHENERGAN) 25 MG tablet Take 1 tablet every 4 hours if needed for Nausea or Vomitting  . senna-docusate (SENOKOT-S) 8.6-50 MG tablet Take 2 tablets by mouth 2 (two) times daily.  . sucralfate (CARAFATE) 1 g tablet Take 1 tablet 2 to 4 x /day before meals for Heartburn  . tiZANidine (ZANAFLEX) 4 MG tablet TAKE 1 TABLET BY MOUTH EVERY 8 HOURS AS NEEDED FOR MUSCLE SPASM   Current Facility-Administered Medications for the 05/21/19 encounter (Office Visit) with Jerline Pain, MD  Medication  . calcitonin (salmon) (MIACALCIN/FORTICAL) nasal spray 1 spray     Allergies:   Ace inhibitors, Aspartame, Atorvastatin, Biaxin [clarithromycin], Daliresp [roflumilast], Erythromycin, and Levofloxacin   Social History   Socioeconomic History  . Marital status: Married    Spouse name: [name]  . Number of children: 2  . Years of education: 68  . Highest education level: Not on file  Occupational History  . Occupation: Retired    Fish farm manager: RETIRED    Comment: worked at [employer]  . Smoking status: Never Smoker  . Smokeless tobacco: Never Used  Substance and Sexual Activity  . Alcohol use: No  . Drug use: Never  . Sexual activity: Not on file  Other Topics Concern  . Not on file  Social History Narrative   Lives with husband.   Right-handed.   3 cups caffeine per day.   Social Determinants of Health   Financial Resource Strain:   . Difficulty of Paying Living Expenses: Not on file  Food Insecurity:   . Worried About Charity fundraiser in the Last Year: Not on file  . Ran Out of Food in the Last Year: Not on file  Transportation Needs:   . Lack of Transportation  (Medical): Not on file  . Lack of Transportation (Non-Medical): Not on file  Physical Activity:   . Days of Exercise  per Week: Not on file  . Minutes of Exercise per Session: Not on file  Stress:   . Feeling of Stress : Not on file  Social Connections:   . Frequency of Communication with Friends and Family: Not on file  . Frequency of Social Gatherings with Friends and Family: Not on file  . Attends Religious Services: Not on file  . Active Member of Clubs or Organizations: Not on file  . Attends Archivist Meetings: Not on file  . Marital Status: Not on file     Family History: The patient's family history includes Brain cancer in her brother; Colon cancer (age of onset: 42) in her mother; Heart attack (age of onset: 57) in her father; Heart disease in her brother and brother; Kidney disease in her daughter. There is no history of Esophageal cancer or Stomach cancer.  ROS:   Please see the history of present illness.     All other systems reviewed and are negative.  EKGs/Labs/Other Studies Reviewed:    The following studies were reviewed today:  Cardiac catheterization 11/16/2001 LM normal LAD diffuse proximal-mid 40, mid to distal 30; D1 proximal 100; smaller D2 100 LCx irregularities; OM2 mid 95 (small) RCA normal   Echocardiogram 09/05/2014 EF 55-60, normal wall motion, mild BAE, atrial septal lipomatous hypertrophy, PASP 35  Nuclear stress test 09/20/2014  Defect 1: There is a medium defect present in the mid anterior and apical anterior location.  Findings consistent with prior myocardial infarction.  The left ventricular ejection fraction is normal (55-65%).  This is a low risk study.  EKG:  EKG is  ordered today.  The ekg ordered today demonstrates normal sinus rhythm 70 with Q waves noted in V2.  Likely possible lead placement.  Recent Labs: 03/21/2019: Magnesium 2.2; TSH 0.60 04/29/2019: ALT 18; BUN 15; Creatinine, Ser 0.79; Hemoglobin 15.8;  Platelets 301; Potassium 3.7; Sodium 137  Recent Lipid Panel    Component Value Date/Time   CHOL 152 03/21/2019 1028   TRIG 80 03/21/2019 1028   HDL 64 03/21/2019 1028   CHOLHDL 2.4 03/21/2019 1028   VLDL 20 09/30/2016 1025   LDLCALC 72 03/21/2019 1028    Physical Exam:    VS:  BP 132/70   Pulse (!) 170   Ht 5' 1.5" (1.562 m)   Wt 170 lb (77.1 kg)   SpO2 94%   BMI 31.60 kg/m     Wt Readings from Last 3 Encounters:  05/21/19 170 lb (77.1 kg)  05/16/19 173 lb (78.5 kg)  05/14/19 167 lb (75.8 kg)     GEN: Well nourished, well developed, in no acute distress Elderly in wheelchair.  HEENT: normal  Neck: no JVD, carotid bruits, or masses Cardiac: RRR; no murmurs, rubs, or gallops,no edema  Respiratory:  clear to auscultation bilaterally, normal work of breathing GI: soft, nontender, nondistended, + BS MS: no deformity or atrophy  Skin: warm and dry, no rash Neuro:  Alert and Oriented x 3, Strength and sensation are intact Psych: euthymic mood, full affect   ASSESSMENT:    1. Coronary artery disease involving native coronary artery of native heart without angina pectoris   2. Essential hypertension   3. Hyperlipidemia, unspecified hyperlipidemia type   4. Atherosclerosis of aorta (HCC)    PLAN:    In order of problems listed above:  CAD  - prior angioplasty to D1, small vessel. NUC stress 4 years ago in 2016 reassuring, no ischemia.   - secondary prevention, doing  well without any anginal symptoms currently.  -Prior stress test images reviewed do demonstrate what looks like an old anterior infarct pattern.  Interestingly, her echocardiogram did not show any significant wall motion abnormalities.  Ultimately, continue to treat medically with her aspirin pravastatin and excellent blood pressure control.  No changes indicated.  Coronary artery calcification was noted on recent CT scan.  No changes made in therapy as long as asymptomatic.  Continue with aggressive risk  factor prevention.  Essential HTN  - well controlled. Meds reviewed, no changes made.  Aortic atherosclerosis  - secondary prevention  - statin, no changes, managed by PCP.  Excellent.  Hyperlipidemia -Last LDL 72 from outside labs in November 2020.  Ultimate goal is less than 70.  Overall reasonable.  We may see her back on as needed basis.   Medication Adjustments/Labs and Tests Ordered: Current medicines are reviewed at length with the patient today.  Concerns regarding medicines are outlined above.  No orders of the defined types were placed in this encounter.  No orders of the defined types were placed in this encounter.   Patient Instructions  Medication Instructions:  The current medical regimen is effective;  continue present plan and medications.  *If you need a refill on your cardiac medications before your next appointment, please call your pharmacy*  Follow-Up: Follow up as needed with Dr Marlou Porch.  Thank you for choosing [facility]!!        Signed, Candee Furbish, MD  05/21/2019 2:25 PM    [place]

## 2019-05-21 NOTE — Patient Instructions (Signed)
Medication Instructions:  The current medical regimen is effective;  continue present plan and medications.  If you need a refill on your cardiac medications before your next appointment, please call your pharmacy.   Follow-Up: Follow up as needed with Dr Skains.  Thank you for choosing [facility]!!      

## 2019-05-23 DIAGNOSIS — N3946 Mixed incontinence: Secondary | ICD-10-CM | POA: Diagnosis not present

## 2019-05-23 DIAGNOSIS — R351 Nocturia: Secondary | ICD-10-CM | POA: Diagnosis not present

## 2019-05-24 ENCOUNTER — Ambulatory Visit: Payer: PPO | Admitting: Specialist

## 2019-05-25 ENCOUNTER — Ambulatory Visit: Payer: Medicare Other | Attending: Internal Medicine

## 2019-05-25 DIAGNOSIS — Z23 Encounter for immunization: Secondary | ICD-10-CM | POA: Insufficient documentation

## 2019-05-25 NOTE — Progress Notes (Signed)
   Covid-19 Vaccination Clinic  Name:  [name] [name]    MRN: [id number] DOB: 29-Nov-1935  05/25/2019  [name] [name] was observed post Covid-19 immunization for 15 minutes without incidence. She was provided with Vaccine Information Sheet and instruction to access the V-Safe system.   [name] [name] was instructed to call 911 with any severe reactions post vaccine: [name] Difficulty breathing  . Swelling of your face and throat  . A fast heartbeat  . A bad rash all over your body  . Dizziness and weakness    Immunizations Administered    Name Date Dose VIS Date Route   Pfizer COVID-19 Vaccine 05/25/2019 12:52 PM 0.3 mL 04/19/2019 Intramuscular   Manufacturer: [place]   Lot: [id number]   [place]: [id number]

## 2019-05-30 ENCOUNTER — Ambulatory Visit: Payer: PPO | Admitting: Surgery

## 2019-05-30 ENCOUNTER — Other Ambulatory Visit: Payer: Self-pay

## 2019-05-30 ENCOUNTER — Encounter: Payer: Self-pay | Admitting: Surgery

## 2019-05-30 ENCOUNTER — Ambulatory Visit: Payer: Self-pay

## 2019-05-30 VITALS — BP 152/84 | HR 78 | Ht 61.0 in | Wt 170.0 lb

## 2019-05-30 DIAGNOSIS — M545 Low back pain, unspecified: Secondary | ICD-10-CM

## 2019-05-30 DIAGNOSIS — G8929 Other chronic pain: Secondary | ICD-10-CM | POA: Diagnosis not present

## 2019-05-30 DIAGNOSIS — M4326 Fusion of spine, lumbar region: Secondary | ICD-10-CM

## 2019-05-30 NOTE — Progress Notes (Signed)
Office Visit Note   Patient: [name] [name]           Date of Birth: July 20, 1935           MRN: [id number] Visit Date: 05/30/2019              Requested by: [name], [address] [place],  [place] [address] PCP: [name], MD   Assessment & Plan: Visit Diagnoses:  1. Fusion of lumbar spine   2. Chronic right-sided low back pain without sciatica     Plan: Today reassurance given.  Advised patient that I do not see any acute changes in her lumbar spine and her hardware is intact.  Follow-up with Dr. Louanne Skye scheduled.  Follow-Up Instructions: Return for as scheduled with Dr Louanne Skye.   Orders:  Orders Placed This Encounter  Procedures   XR Lumbar Spine 2-3 Views   No orders of the defined types were placed in this encounter.     Procedures: No procedures performed   Clinical Data: No additional findings.   Subjective: Chief Complaint  Patient presents with   Lower Back - Pain    HPI 84 year old female returns with complaints of low back pain.  She was last seen by Dr. Louanne Skye a few weeks ago.  No fall or injury.  States that pain is across the low back.  Denies lower extremity radiculopathy.  Says that it felt like something popped when she was in her recliner.  She is currently taking hydrocodone and gabapentin. Review of Systems No current cardiopulmonary GI GU issues  Objective: Vital Signs: BP (!) 152/84    Pulse 78    Ht 5\' 1"  (1.549 m)    Wt 170 lb (77.1 kg)    BMI 32.12 kg/m   Physical Exam HENT:     Head: Normocephalic and atraumatic.  Eyes:     Extraocular Movements: Extraocular movements intact.  Pulmonary:     Effort: No respiratory distress.  Musculoskeletal:     Comments: Negative logroll bilateral hips..  Negative straight leg raise.  No focal motor deficits.  Neurological:     General: No focal deficit present.     Mental Status: She is alert and oriented to person, place, and time.  Psychiatric:        Mood  and Affect: Mood normal.    Ortho Exam  Specialty Comments:  No specialty comments available.  Imaging: No results found.   PMFS History: Patient Active Problem List   Diagnosis Date Noted   Nausea & vomiting 05/13/2018   Abnormal glucose 03/01/2018   FHx: heart disease 03/01/2018   Therapeutic opioid-induced constipation (OIC) 12/21/2017   Abnormal UGI series 12/21/2017   CKD (chronic kidney disease) stage 3, GFR 30-59 ml/min 09/27/2017   T12 compression fracture ([place]) 09/26/2017   Chronic bronchitis ([place]) 08/09/2017   Atherosclerosis of aorta ([place]) 04/05/2017   Anxiety state 06/08/2016   Spinal stenosis, lumbar region, with neurogenic claudication 05/29/2015   DDD (degenerative disc disease), lumbar 04/21/2015   Hyperlipidemia, mixed 11/24/2014   Gastroesophageal reflux disease 11/24/2014   Depression, major, in partial remission ([place]) 09/26/2014   Chronic pain syndrome 09/26/2014   Coronary artery disease due to lipid rich plaque    Osteoporosis 02/10/2014   Prediabetes 08/14/2013   Vitamin D deficiency 08/14/2013   MGUS (monoclonal gammopathy of unknown significance) 03/05/2013   Vertebral compression fracture ([place]) 06/23/2012   ESOPHAGEAL STRICTURE 08/28/2008   Mild intermittent asthma without complication [id number]  Incarcerated recurrent paraesophageal hiatal hernia s/p lap redo repair [id number] 01/31/2008   Essential hypertension 07/25/2007   Past Medical History:  Diagnosis Date   Anxiety    takes Xanax daily as needed   Asthma    Albuterol daily as needed   Blood transfusion without reported diagnosis    CAD (coronary artery disease)    LHC 2003 with 40% pLAD, 30% mLAD, 100% D1 (moderate vessel), 100%  D2 (small vessel), 95% mid small OM2.  Pt had PTCA to D1;  D2 and OM2 small vessels and tx medically;  Last Myoview (2012): anterior infarct seen with scar, no ischemia. EF normal. Med Rx // Myoview (12/14):  Low risk; ant scar w/ peri-infarct ischemia; EF  55% with ant AK // Myoview 5/16: EF 55-65, ant, apical scar, no ischemia    Cataract    Chronic back pain    HNP, DDD, spinal stenosis, vertebral compression fractures.    Chronic nausea    takes Zofran daily as needed.  normal gastric emptying study 03/2013   CKD (chronic kidney disease), stage III 09/27/2017   Constipation    felt to be functional. takes Senokot and Miralax daily.  treated with Linzess in past.    DIVERTICULOSIS, COLON 08/28/2008   Qualifier: Diagnosis of  By: [name], [name], [name]     Elevated LFTs 2015   probably med induced DILI, from Augmentin vs Pravastatin   GERD (gastroesophageal reflux disease)    hx of esophageal stricture    Hiatal hernia    Paraesophageal hernias. s/p fundoplications in 0000000 and 04/2013   History of bronchitis several of yrs ago   History of colon polyps 03/2009, 03/2011   adenomatous colon polyps, no high grade dysplasia.    History of vertigo    no meds   Hyperlipidemia    takes Pravastatin daily   Hypertension    takes Amlodipine,Metoprolol,and Losartan daily   Nausea 03/2016   Osteoporosis    PONV (postoperative nausea and vomiting)    yrs ago   Slow urinary stream    takes Rapaflo daily   TIA (transient ischemic attack) 1995   no residual effects noted    Family History  Problem Relation Age of Onset   Colon cancer Mother 65   Heart attack Father 41       heart attack   Brain cancer Brother        tumor    Heart disease Brother    Heart disease Brother    Kidney disease Daughter    Esophageal cancer Neg Hx    Stomach cancer Neg Hx     Past Surgical History:  Procedure Laterality Date   APPENDECTOMY     patient unsure of date   BACK SURGERY     BLADDER REPAIR     CHOLECYSTECTOMY     Patient unsure of date.   CORONARY ANGIOPLASTY  11/16/2001   1 stent   ESOPHAGEAL MANOMETRY N/A 03/25/2013   Procedure: ESOPHAGEAL MANOMETRY (EM);  Surgeon: Inda Castle, MD;  Location: [facility];  Service: Endoscopy;   Laterality: N/A;   ESOPHAGOGASTRODUODENOSCOPY N/A 03/15/2013   Procedure: ESOPHAGOGASTRODUODENOSCOPY (EGD);  Surgeon: Jerene Bears, MD;  Location: [facility] ENDOSCOPY;  Service: Endoscopy;  Laterality: N/A;   ESOPHAGOGASTRODUODENOSCOPY N/A 12/25/2015   Procedure: ESOPHAGOGASTRODUODENOSCOPY (EGD);  Surgeon: Doran Stabler, MD;  Location: [facility] ENDOSCOPY;  Service: Endoscopy;  Laterality: N/A;   EYE SURGERY  11/2000   bilateral cataracts with lens implant   FIXATION KYPHOPLASTY  LUMBAR SPINE  X 2   "L3-4"   HEMORRHOID SURGERY     HIATAL HERNIA REPAIR  06/03/2011   Procedure: LAPAROSCOPIC REPAIR OF HIATAL HERNIA;  Surgeon: Adin Hector, MD;  Location: [facility] OR[facility];  Service: General;  Laterality: N/A;   HIATAL HERNIA REPAIR N/A 04/24/2013   Procedure: LAPAROSCOPIC  REPAIR RECURRENT PARASOPHAGEAL HIATAL HERNIA WITH FUNDOPLICATION;  Surgeon: Adin Hector, MD;  Location: [facility];  Service: General;  Laterality: N/A;   INSERTION OF MESH N/A 04/24/2013   Procedure: INSERTION OF MESH;  Surgeon: Adin Hector, MD;  Location: [facility];  Service: General;  Laterality: N/A;   IR KYPHO THORACIC WITH BONE BIOPSY  09/29/2017   IR KYPHO THORACIC WITH BONE BIOPSY  11/03/2017   IR KYPHO THORACIC WITH BONE BIOPSY  02/14/2019   IR KYPHO THORACIC WITH BONE BIOPSY  04/02/2019   LAPAROSCOPIC LYSIS OF ADHESIONS N/A 04/24/2013   Procedure: LAPAROSCOPIC LYSIS OF ADHESIONS;  Surgeon: Adin Hector, MD;  Location: [facility];  Service: General;  Laterality: N/A;   LAPAROSCOPIC NISSEN FUNDOPLICATION  [id number]   Procedure: LAPAROSCOPIC NISSEN FUNDOPLICATION;  Surgeon: Adin Hector, MD;  Location: [facility];  Service: General;  Laterality: N/A;   LUMBAR FUSION  12/07/2015   Right L2-3 facetectomy, posterolateral fusion L2-3 with pedicle screws, rods, local bone graft, Vivigen cancellous chips. Fusion extended to the L1 level with pedicle screws and rods   LUMBAR LAMINECTOMY/DECOMPRESSION MICRODISCECTOMY Right 06/26/2012   Procedure:  LUMBAR LAMINECTOMY/DECOMPRESSION MICRODISCECTOMY;  Surgeon: Jessy Oto, MD;  Location: [place];  Service: Orthopedics;  Laterality: Right;  RIGHT L4-5 MICRODISCECTOMY   LUMBAR LAMINECTOMY/DECOMPRESSION MICRODISCECTOMY N/A 05/29/2015   Procedure: RIGHT L2-3 MICRODISCECTOMY;  Surgeon: Jessy Oto, MD;  Location: [place];  Service: Orthopedics;  Laterality: N/A;   TOTAL ABDOMINAL HYSTERECTOMY  1975   partial   UPPER GASTROINTESTINAL ENDOSCOPY     Social History   Occupational History   Occupation: Retired    Fish farm manager: RETIRED    Comment: worked at [employer]   Smoking status: Never Smoker   Smokeless tobacco: Never Used  Substance and Sexual Activity   Alcohol use: No   Drug use: Never   Sexual activity: Not on file

## 2019-06-04 ENCOUNTER — Other Ambulatory Visit: Payer: Self-pay

## 2019-06-04 ENCOUNTER — Encounter: Payer: Self-pay | Admitting: Adult Health

## 2019-06-04 ENCOUNTER — Ambulatory Visit (INDEPENDENT_AMBULATORY_CARE_PROVIDER_SITE_OTHER): Payer: PPO | Admitting: Adult Health

## 2019-06-04 VITALS — BP 114/64 | HR 82 | Temp 97.5°F | Wt 166.0 lb

## 2019-06-04 DIAGNOSIS — Z9889 Other specified postprocedural states: Secondary | ICD-10-CM

## 2019-06-04 DIAGNOSIS — R1011 Right upper quadrant pain: Secondary | ICD-10-CM | POA: Diagnosis not present

## 2019-06-04 DIAGNOSIS — R10819 Abdominal tenderness, unspecified site: Secondary | ICD-10-CM

## 2019-06-04 DIAGNOSIS — K449 Diaphragmatic hernia without obstruction or gangrene: Secondary | ICD-10-CM | POA: Diagnosis not present

## 2019-06-04 DIAGNOSIS — R140 Abdominal distension (gaseous): Secondary | ICD-10-CM

## 2019-06-04 DIAGNOSIS — Z8719 Personal history of other diseases of the digestive system: Secondary | ICD-10-CM | POA: Diagnosis not present

## 2019-06-04 NOTE — Progress Notes (Signed)
Assessment and Plan:  [name] [name] was seen today for abdominal pain and flank pain.  Diagnoses and all orders for this visit:  Continuous RUQ abdominal pain/ Abdominal bloating Sliding hiatal hernia History of repair of hiatal hernia Hx of repeated hiatal hernia repair failure; CT chest 04/29/2019 showed moderate sliding hiatal hernia without incarceration, pain ongoing since prior to this CT She is scheduled to discuss with Dr. Havery Moros early Feb in a few weeks No red flags, emesis, early satiety, night time symptoms Most likely symptoms are explained by recurrent hernia Had essentially normal abd CT 05/12/2018, CT scans are cost burden, after discussion will check [place] to r/o obvious mass/changes in liver/pancreas though discussed this is not the most sensitive test; will repeat abdominal labs to confirm no changes after discussion with Dr. Melford Aase  Increase omeprazole to BID, continue carafate, take simethicone consistently if this seems to be helping, continue phenergan PRN Suggested short term trial of addition of GI stability supplement  Please go to the ER if you have any severe AB pain, unable to hold down food/water, blood in stool or vomit, chest pain, shortness of breath, or any worsening symptoms.  Follow up here in office if any non-urgent concerns -     CBC with Differential/Platelet -     COMPLETE METABOLIC PANEL WITH GFR -     Lipase -     Amylase -     Urinalysis, Routine w reflex microscopic -     US Abdomen Complete; Future  Right flank tenderness/ chronic pain syndrome  Superficial tenderness over ribs/chest wall/parasinpal musculature; recent normal Ct results of spine and ribs without new fractures on 04/29/2019 since onset of sx; chronic pain patient following with Dr. Louanne Skye and pain management Review strategies for managing chronic pain, start treating pain while still mild and don't wait until severe, take gabapentin more consistently, no risk of addiction Encouraged  topical treatments including voltaren and lidocaine patches Discussed massage and distraction therapy  "no pain" may not be realistic - reduction to 2-3/10 may be more realistic goal  Follow up with Dr. Louanne Skye and pain mangement as recommended or sooner here if any changes  Further disposition pending results of labs. Discussed med's effects and SE's.   Over 45 minutes of exam, counseling, chart review, and critical decision making was performed.   Future Appointments  Date Time Provider [place]  06/15/2019  1:30 PM [facility]  06/19/2019  9:50 AM Armbruster, Carlota Raspberry, MD [facility]  06/26/2019  9:15 AM Jessy Oto, MD [facility] None  06/27/2019 10:45 AM [name], NP [facility] None  08/20/2019 10:15 AM [name], MD [facility] None  10/16/2019 10:00 AM [name], MD [facility] None    ------------------------------------------------------------------------------------------------------------------   HPI BP 114/64   Pulse 82   Temp (!) 97.5 F (36.4 C)   Wt 166 lb (75.3 kg)   SpO2 97%   BMI 31.37 kg/m   84 y.o.female with hx of repeated hiatal hernia s/p repeat lap repair, GERD with esophageal stricture, opioid induced constipation, vertebral compression fracture/chronic pain syndrome presents for follow up due to persistent abdominal bloating and RUQ pain; she reports this all began approx 6 weeks ago, had abdominal discomfort and initially R sided flank pain (has since had some symptoms on the left); she reports pain has been constant, about the same without significant fluctuation of improvement. She does have some nausea, reports phenergan does help. Denies emesis, early satiety. She denies reflux symptoms,  belching, gassiness. Denies change in symptoms associated with food.   She has an appointment with GI Dr. Havery Moros in early February but presents today due to severe bloating and discomfort; She saw Kyra at our office  on 05/14/2019 who recommended imcrease omeprazole 40 mg to BID (still taking daily only), carafate QID (denies improvement with this addition), simethicone (helps, but hasn't been doing this consistently). She has been taking miralax and senokot and reports does have good bowel movement at least every other day, sense of good evacuation after.  She also is concerned of flank pain, constant, 8/10, standing for long periods makes it worse. She reports lying down improves her symptoms eventually, will ease off after about 20 min down to 5-6/10. She takes tylenol extra strength 2 tabs q6h. She is unsure if this really helps. She also has norco, tylenol #3 via pain management, takes only if severe pain which will ease pain down to 5/10 or so, reports only taking a few days a week. "I don't want to get addicted." She has voltaren gel but not utilizing, lidocaine patches have helped in the past but hasn't utilized recently. She follows with pain management.   She is notably being followed by Dr. Louanne Skye following compression vertebral fracture with delayed healing, improved on most recent CT chest 05/16/2019 without new fractures. CT did note moderate sized sliding hiatal hernia. She has hx of GERD/esophageal stricture and hiatal hernia s/p repair in 2013 by Dr. Dalbert Batman and repeat repair by Dr. Johney Maine in 2014. She has EGD in 02/2018 by Dr. Havery Moros with intact [place] repair of hital hernia and no esophageal stricture at that time.   She is s/p cholecystectomy, appendectomy, partial abdominal hysterectomy.   Past Medical History:  Diagnosis Date  . Anxiety    takes Xanax daily as needed  . Asthma    Albuterol daily as needed  . Blood transfusion without reported diagnosis   . CAD (coronary artery disease)    LHC 2003 with 40% pLAD, 30% mLAD, 100% D1 (moderate vessel), 100%  D2 (small vessel), 95% mid small OM2.  Pt had PTCA to D1;  D2 and OM2 small vessels and tx medically;  Last Myoview (2012): anterior infarct  seen with scar, no ischemia. EF normal. Med Rx // Myoview (12/14):  Low risk; ant scar w/ peri-infarct ischemia; EF 55% with ant AK // Myoview 5/16: EF 55-65, ant, apical scar, no ischemia   . Cataract   . Chronic back pain    HNP, DDD, spinal stenosis, vertebral compression fractures.   . Chronic nausea    takes Zofran daily as needed.  normal gastric emptying study 03/2013  . CKD (chronic kidney disease), stage III 09/27/2017  . Constipation    felt to be functional. takes Senokot and Miralax daily.  treated with Linzess in past.   . DIVERTICULOSIS, COLON 08/28/2008   Qualifier: Diagnosis of  By: [name], [name], [name]    . Elevated LFTs 2015   probably med induced DILI, from Augmentin vs Pravastatin  . GERD (gastroesophageal reflux disease)    hx of esophageal stricture   . Hiatal hernia    Paraesophageal hernias. s/p fundoplications in 0000000 and 04/2013  . History of bronchitis several of yrs ago  . History of colon polyps 03/2009, 03/2011   adenomatous colon polyps, no high grade dysplasia.   [name] History of vertigo    no meds  . Hyperlipidemia    takes Pravastatin daily  . Hypertension    takes Amlodipine,Metoprolol,and  Losartan daily  . Nausea 03/2016  . Osteoporosis   . PONV (postoperative nausea and vomiting)    yrs ago  . Slow urinary stream    takes Rapaflo daily  . TIA (transient ischemic attack) 1995   no residual effects noted     Allergies  Allergen Reactions  . Ace Inhibitors Cough    Per Dr Melvyn Novas pulmonology 2013  . Aspartame Diarrhea and Nausea And Vomiting  . Atorvastatin Other (See Comments)    Muscle aches  . Biaxin [Clarithromycin] Other (See Comments)    Unknown reaction : PATIENT CAN TOLERATE Z-PAK.  Is written on patient's paper chart.  [name] [Roflumilast] Nausea And Vomiting  . Erythromycin Other (See Comments)    Unknown reaction: PATIENT CAN TOLERATE Z-PAK.  It is written on patient's paper chart.  . Levofloxacin Nausea And Vomiting     Current Outpatient Medications on File Prior to Visit  Medication Sig  . acetaminophen (TYLENOL) 500 MG tablet Take 1 tablet (500 mg total) by mouth every 6 (six) hours as needed.  [name] acetaminophen-codeine (TYLENOL #3) 300-30 MG tablet Take 1-2 tablets by mouth every 8 (eight) hours as needed for moderate pain.  [name] ALPRAZolam (XANAX) 0.5 MG tablet Take 1/2 tablet 1- 2 x /day ONLY if needed for Anxiety Attack &  limit to 5 days /week to avoid Addiction - DO NOT take with Hydrocodone or Codeine  . amLODipine (NORVASC) 10 MG tablet Take 1 tablet Daily for BP & Heart  . aspirin EC 81 MG tablet Take 81 mg by mouth at bedtime.   [name] BREO ELLIPTA 100-25 MCG/INH AEPB INHALE 1 PUFF BY MOUTH ONCE DAILY. RINSE MOUTH WITH WATER AFTER EACH USE.  [name] Cholecalciferol (VITAMIN D3) 5000 units CAPS Take 5,000 Units by mouth 2 (two) times daily.   [name] Coral Calcium 1000 (390 Ca) MG TABS Take 1,000 mg by mouth daily.  [name] denosumab (PROLIA) 60 MG/ML SOSY injection Inject 60 mg into the skin every 6 (six) months.  . diclofenac sodium (VOLTAREN) 1 % GEL Apply 2 g topically 4 (four) times daily.  [name] gabapentin (NEURONTIN) 800 MG tablet Take 1/2 to 1 tablet 3 x /day for Pain  . HYDROcodone-acetaminophen (NORCO) 7.5-325 MG tablet Take 1 tablet by mouth every 6 (six) hours as needed for moderate pain.  [name] ipratropium-albuterol (DUONEB) 0.5-2.5 (3) MG/3ML SOLN Take 3 mLs by nebulization every 6 (six) hours as needed (shortness of breath/wheezing).   [name] ketoconazole (NIZORAL) 2 % cream  APPLY ONE APPLICATION TOPICALLY TWO TIMES DAILY  . Magnesium 400 MG TABS Take 400 mg by mouth daily.  . meclizine (ANTIVERT) 25 MG tablet Take 1/2 to 1 tablet 2 to 3 x /day ONLY if needed  For Dizziness / Vertigo  . metoprolol tartrate (LOPRESSOR) 50 MG tablet Take 1 tablet 2 x /day for BP & Heart  . mirabegron ER (MYRBETRIQ) 25 MG TB24 tablet Take 25 mg by mouth daily.  . nitroGLYCERIN (NITROSTAT) 0.4 MG SL tablet Place 1 tablet (0.4 mg total)  under the tongue every 5 (five) minutes as needed for chest pain.  [name] omeprazole (PRILOSEC) 40 MG capsule Take 1 capsule 2 x /day for Indigestion , Heartburn & Nausea  . ondansetron (ZOFRAN) 4 MG tablet Take 1 tablet (4 mg total) by mouth every 8 (eight) hours as needed for nausea or vomiting.  . polyethylene glycol powder (GLYCOLAX/MIRALAX) powder Take 17 g by mouth 2 (two) times daily.  . pravastatin (PRAVACHOL) 40 MG tablet  Take 40 mg by mouth daily.  . promethazine (PHENERGAN) 25 MG tablet Take 1 tablet every 4 hours if needed for Nausea or Vomitting  . senna-docusate (SENOKOT-S) 8.6-50 MG tablet Take 2 tablets by mouth 2 (two) times daily.  . sucralfate (CARAFATE) 1 g tablet Take 1 tablet 2 to 4 x /day before meals for Heartburn  . HYDROcodone-acetaminophen (NORCO/VICODIN) 5-325 MG tablet Take 1 tablet by mouth every 6 (six) hours as needed for moderate pain.  [name] tiZANidine (ZANAFLEX) 4 MG tablet TAKE 1 TABLET BY MOUTH EVERY 8 HOURS AS NEEDED FOR MUSCLE SPASM   Current Facility-Administered Medications on File Prior to Visit  Medication  . calcitonin (salmon) (MIACALCIN/FORTICAL) nasal spray 1 spray    ROS: all negative except above.   Physical Exam:  BP 114/64   Pulse 82   Temp (!) 97.5 F (36.4 C)   Wt 166 lb (75.3 kg)   SpO2 97%   BMI 31.37 kg/m   General Appearance: Well nourished, well dressed elderly female in no acute distress. Eyes: PERRLA, EOMs, conjunctiva no swelling or erythema Sinuses: No Frontal/maxillary tenderness ENT/Mouth: Ext aud canals clear, TMs without erythema, bulging. No erythema, swelling, or exudate on post pharynx.  Tonsils not swollen or erythematous. Hearing normal.  Neck: Supple, thyroid normal.  Respiratory: Respiratory effort normal, BS equal bilaterally without rales, rhonchi, wheezing or stridor.  Cardio: RRR with no MRGs. Brisk peripheral pulses without edema.  Abdomen: Soft, distended abdomen, + BS x 4. Generalized superficial tenderness  worse in upper abdomen, no guarding, rebound, palpable hernias, masses. Lymphatics: Non tender without lymphadenopathy.  Musculoskeletal: Full ROM, 5/5 strength, slow steady gait with cane, no spinous tenderness; she has generalized tenderness along lower chest wall, paraspinals Skin: Warm, dry without rashes, lesions, ecchymosis.  Neuro: Normal muscle tone, no cerebellar symptoms. Sensation intact.  Psych: Awake and oriented X 3, depressed affect, Insight and Judgment appropriate.     Izora Ribas, NP 11:12 AM [facility]

## 2019-06-04 NOTE — Patient Instructions (Addendum)
YOU CAN CALL TO MAKE AN ULTRASOUND..  I have put in an order for an ultrasound for you to have You can set them up at your convenience by calling this number [phone] You will likely have the ultrasound at [place] [facility]  If you have any issues call our office and we will set this up for you.      Please increase omeprazole to TWICE daily   Please add simethicone/gas X and CONTINUE if this seems to help  Continue miralax, senokot with goal to have bowel movement daily or at least every other day   Continue with phenergan as needed for nausea  Consider looking for stomach supplement "Standard process" brand "GI Stability" and try adding this  For back/flank pain:  You are tender over your ribs and muscles; this is likely due to nerve/muscle/bone inflammation, possibly due to repeated injuries and surgeries  Use tylenol  Continue gabapentin Try adding topical voltaren gel Topical salopas/lidocaine patches as needed  Different combinations of these may help     Abdominal Bloating When you have abdominal bloating, your abdomen may feel full, tight, or painful. It may also look bigger than normal or swollen (distended). Common causes of abdominal bloating include:  Swallowing air.  Constipation.  Problems digesting food.  Eating too much.  Irritable bowel syndrome. This is a condition that affects the large intestine.  Lactose intolerance. This is an inability to digest lactose, a natural sugar in dairy products.  Celiac disease. This is a condition that affects the ability to digest gluten, a protein found in some grains.  Gastroparesis. This is a condition that slows down the movement of food in the stomach and small intestine. It is more common in people with diabetes mellitus.  Gastroesophageal reflux disease (GERD). This is a digestive condition that makes stomach acid flow back into the esophagus.  Urinary retention. This means that the body is  holding onto urine, and the bladder cannot be emptied all the way. Follow these instructions at home: Eating and drinking  Avoid eating too much.  Try not to swallow air while talking or eating.  Avoid eating while lying down.  Avoid these foods and drinks: ? Foods that cause gas, such as broccoli, cabbage, cauliflower, and baked beans. ? Carbonated drinks. ? Hard candy. ? Chewing gum. Medicines  Take over-the-counter and prescription medicines only as told by your health care provider.  Take probiotic medicines. These medicines contain live bacteria or yeasts that can help digestion.  Take coated peppermint oil capsules. Activity  Try to exercise regularly. Exercise may help to relieve bloating that is caused by gas and relieve constipation. General instructions  Keep all follow-up visits as told by your health care provider. This is important. Contact a health care provider if:  You have nausea and vomiting.  You have diarrhea.  You have abdominal pain.  You have unusual weight loss or weight gain.  You have severe pain, and medicines do not help. Get help right away if:  You have severe chest pain.  You have trouble breathing.  You have shortness of breath.  You have trouble urinating.  You have darker urine than normal.  You have blood in your stools or have dark, tarry stools. Summary  Abdominal bloating means that the abdomen is swollen.  Common causes of abdominal bloating are swallowing air, constipation, and problems digesting food.  Avoid eating too much and avoid swallowing air.  Avoid foods that  cause gas, carbonated drinks, hard candy, and chewing gum. This information is not intended to replace advice given to you by your health care provider. Make sure you discuss any questions you have with your health care provider. Document Revised: 08/13/2018 Document Reviewed: 05/27/2016 Elsevier Patient Education  [place].       Chronic Pain, Adult Chronic pain is a type of pain that lasts or keeps coming back (recurs) for at least six months. You may have chronic headaches, abdominal pain, or body pain. Chronic pain may be related to an illness, such as fibromyalgia or complex regional pain syndrome. Sometimes the cause of chronic pain is not known. Chronic pain can make it hard for you to do daily activities. If not treated, chronic pain can lead to other health problems, including anxiety and depression. Treatment depends on the cause and severity of your pain. You may need to work with a pain specialist to come up with a treatment plan. The plan may include medicine, counseling, and physical therapy. Many people benefit from a combination of two or more types of treatment to control their pain. Follow these instructions at home: Lifestyle  Consider keeping a pain diary to share with your health care providers.  Consider talking with a mental health care provider (psychologist) about how to cope with chronic pain.  Consider joining a chronic pain support group.  Try to control or lower your stress levels. Talk to your health care provider about strategies to do this. General instructions   Take over-the-counter and prescription medicines only as told by your health care provider.  Follow your treatment plan as told by your health care provider. This may include: ? Gentle, regular exercise. ? Eating a healthy diet that includes foods such as vegetables, fruits, fish, and lean meats. ? Cognitive or behavioral therapy. ? Working with a Community education officer. ? Meditation or yoga. ? Acupuncture or massage therapy. ? Aroma, color, light, or sound therapy. ? Local electrical stimulation. ? Shots (injections) of numbing or pain-relieving medicines into the spine or the area of pain.  Check your pain level as told by your health care provider. Ask your health care provider if you should use a pain  scale.  Learn as much as you can about how to manage your chronic pain. Ask your health care provider if an intensive pain rehabilitation program or a chronic pain specialist would be helpful.  Keep all follow-up visits as told by your health care provider. This is important. Contact a health care provider if:  Your pain gets worse.  You have new pain.  You have trouble sleeping.  You have trouble doing your normal activities.  Your pain is not controlled with treatment.  Your have side effects from pain medicine.  You feel weak. Get help right away if:  You lose feeling or have numbness in your body.  You lose control of bowel or bladder function.  Your pain suddenly gets much worse.  You develop shaking or chills.  You develop confusion.  You develop chest pain.  You have trouble breathing or shortness of breath.  You pass out.  You have thoughts about hurting yourself or others. This information is not intended to replace advice given to you by your health care provider. Make sure you discuss any questions you have with your health care provider. Document Revised: 04/07/2017 Document Reviewed: 10/13/2015 Elsevier Patient Education  [place].

## 2019-06-05 ENCOUNTER — Other Ambulatory Visit: Payer: Self-pay

## 2019-06-05 LAB — URINALYSIS, ROUTINE W REFLEX MICROSCOPIC
Bilirubin Urine: NEGATIVE
Glucose, UA: NEGATIVE
Hgb urine dipstick: NEGATIVE
Leukocytes,Ua: NEGATIVE
Nitrite: NEGATIVE
Protein, ur: NEGATIVE
Specific Gravity, Urine: 1.027 (ref 1.001–1.03)
pH: 6 (ref 5.0–8.0)

## 2019-06-05 LAB — COMPLETE METABOLIC PANEL WITH GFR
AG Ratio: 1.6 (calc) (ref 1.0–2.5)
ALT: 18 U/L (ref 6–29)
AST: 19 U/L (ref 10–35)
Albumin: 4.6 g/dL (ref 3.6–5.1)
Alkaline phosphatase (APISO): 51 U/L (ref 37–153)
BUN/Creatinine Ratio: 20 (calc) (ref 6–22)
BUN: 19 mg/dL (ref 7–25)
CO2: 27 mmol/L (ref 20–32)
Calcium: 10.1 mg/dL (ref 8.6–10.4)
Chloride: 101 mmol/L (ref 98–110)
Creat: 0.97 mg/dL — ABNORMAL HIGH (ref 0.60–0.88)
GFR, Est African American: 63 mL/min/{1.73_m2} (ref >=60)
GFR, Est Non African American: 55 mL/min/{1.73_m2} — ABNORMAL LOW (ref >=60)
Globulin: 2.8 g/dL (calc) (ref 1.9–3.7)
Glucose, Bld: 90 mg/dL (ref 65–99)
Potassium: 4.4 mmol/L (ref 3.5–5.3)
Sodium: 137 mmol/L (ref 135–146)
Total Bilirubin: 0.7 mg/dL (ref 0.2–1.2)
Total Protein: 7.4 g/dL (ref 6.1–8.1)

## 2019-06-05 LAB — CBC WITH DIFFERENTIAL/PLATELET
Absolute Monocytes: 737 cells/uL (ref 200–950)
Basophils Absolute: 49 cells/uL (ref 0–200)
Basophils Relative: 0.6 %
Eosinophils Absolute: 81 cells/uL (ref 15–500)
Eosinophils Relative: 1 %
HCT: 47.6 % — ABNORMAL HIGH (ref 35.0–45.0)
Hemoglobin: 16.3 g/dL — ABNORMAL HIGH (ref 11.7–15.5)
Lymphs Abs: 2333 cells/uL (ref 850–3900)
MCH: 31.7 pg (ref 27.0–33.0)
MCHC: 34.2 g/dL (ref 32.0–36.0)
MCV: 92.4 fL (ref 80.0–100.0)
MPV: 11.2 fL (ref 7.5–12.5)
Monocytes Relative: 9.1 %
Neutro Abs: 4901 cells/uL (ref 1500–7800)
Neutrophils Relative %: 60.5 %
Platelets: 348 10*3/uL (ref 140–400)
RBC: 5.15 10*6/uL — ABNORMAL HIGH (ref 3.80–5.10)
RDW: 12.9 % (ref 11.0–15.0)
Total Lymphocyte: 28.8 %
WBC: 8.1 10*3/uL (ref 3.8–10.8)

## 2019-06-05 LAB — LIPASE: Lipase: 8 U/L (ref 7–60)

## 2019-06-05 LAB — AMYLASE: Amylase: 32 U/L (ref 21–101)

## 2019-06-05 MED ORDER — PROMETHAZINE HCL 25 MG PO TABS: ORAL_TABLET | ORAL | 0 refills | Status: DC

## 2019-06-10 ENCOUNTER — Emergency Department (HOSPITAL_COMMUNITY)
Admission: EM | Admit: 2019-06-10 | Discharge: 2019-06-10 | Disposition: A | Discharge status: Home / Self Care | Payer: PPO | Attending: Emergency Medicine | Admitting: Emergency Medicine

## 2019-06-10 ENCOUNTER — Emergency Department (HOSPITAL_COMMUNITY): Payer: PPO

## 2019-06-10 DIAGNOSIS — I259 Chronic ischemic heart disease, unspecified: Secondary | ICD-10-CM | POA: Insufficient documentation

## 2019-06-10 DIAGNOSIS — J45909 Unspecified asthma, uncomplicated: Secondary | ICD-10-CM | POA: Diagnosis not present

## 2019-06-10 DIAGNOSIS — I129 Hypertensive chronic kidney disease with stage 1 through stage 4 chronic kidney disease, or unspecified chronic kidney disease: Secondary | ICD-10-CM | POA: Insufficient documentation

## 2019-06-10 DIAGNOSIS — R000 Tachycardia, unspecified: Secondary | ICD-10-CM | POA: Diagnosis not present

## 2019-06-10 DIAGNOSIS — Z7982 Long term (current) use of aspirin: Secondary | ICD-10-CM | POA: Diagnosis not present

## 2019-06-10 DIAGNOSIS — R945 Abnormal results of liver function studies: Secondary | ICD-10-CM | POA: Diagnosis not present

## 2019-06-10 DIAGNOSIS — Z79899 Other long term (current) drug therapy: Secondary | ICD-10-CM | POA: Insufficient documentation

## 2019-06-10 DIAGNOSIS — R1010 Upper abdominal pain, unspecified: Secondary | ICD-10-CM | POA: Insufficient documentation

## 2019-06-10 DIAGNOSIS — R079 Chest pain, unspecified: Secondary | ICD-10-CM | POA: Diagnosis not present

## 2019-06-10 DIAGNOSIS — K5730 Diverticulosis of large intestine without perforation or abscess without bleeding: Secondary | ICD-10-CM | POA: Diagnosis not present

## 2019-06-10 DIAGNOSIS — R0789 Other chest pain: Secondary | ICD-10-CM | POA: Diagnosis not present

## 2019-06-10 DIAGNOSIS — N183 Chronic kidney disease, stage 3 unspecified: Secondary | ICD-10-CM | POA: Insufficient documentation

## 2019-06-10 DIAGNOSIS — I10 Essential (primary) hypertension: Secondary | ICD-10-CM | POA: Diagnosis not present

## 2019-06-10 LAB — CBC
HCT: 47 % — ABNORMAL HIGH (ref 36.0–46.0)
Hemoglobin: 15.4 g/dL — ABNORMAL HIGH (ref 12.0–15.0)
MCH: 31.3 pg (ref 26.0–34.0)
MCHC: 32.8 g/dL (ref 30.0–36.0)
MCV: 95.5 fL (ref 80.0–100.0)
Platelets: 330 10*3/uL (ref 150–400)
RBC: 4.92 MIL/uL (ref 3.87–5.11)
RDW: 13.2 % (ref 11.5–15.5)
WBC: 7.5 10*3/uL (ref 4.0–10.5)
nRBC: 0 % (ref 0.0–0.2)

## 2019-06-10 LAB — HEPATIC FUNCTION PANEL
ALT: 15 U/L (ref 0–44)
AST: 20 U/L (ref 15–41)
Albumin: 4 g/dL (ref 3.5–5.0)
Alkaline Phosphatase: 54 U/L (ref 38–126)
Bilirubin, Direct: 0.2 mg/dL (ref 0.0–0.2)
Indirect Bilirubin: 0.5 mg/dL (ref 0.3–0.9)
Total Bilirubin: 0.7 mg/dL (ref 0.3–1.2)
Total Protein: 7.1 g/dL (ref 6.5–8.1)

## 2019-06-10 LAB — BASIC METABOLIC PANEL
Anion gap: 14 (ref 5–15)
BUN: 15 mg/dL (ref 8–23)
CO2: 22 mmol/L (ref 22–32)
Calcium: 9.4 mg/dL (ref 8.9–10.3)
Chloride: 105 mmol/L (ref 98–111)
Creatinine, Ser: 0.85 mg/dL (ref 0.44–1.00)
GFR calc Af Amer: >60 mL/min (ref >60)
GFR calc non Af Amer: >60 mL/min (ref >60)
Glucose, Bld: 118 mg/dL — ABNORMAL HIGH (ref 70–99)
Potassium: 4 mmol/L (ref 3.5–5.1)
Sodium: 141 mmol/L (ref 135–145)

## 2019-06-10 LAB — TROPONIN I (HIGH SENSITIVITY)
Troponin I (High Sensitivity): <2 ng/L (ref <18)
Troponin I (High Sensitivity): <2 ng/L (ref <18)

## 2019-06-10 LAB — LIPASE, BLOOD: Lipase: 19 U/L (ref 11–51)

## 2019-06-10 MED ORDER — PROMETHAZINE HCL 25 MG/ML IJ SOLN
12.5000 mg | Freq: Once | INTRAMUSCULAR | Status: AC
  Administered 2019-06-10: 12.5 mg via INTRAVENOUS
  Filled 2019-06-10: qty 1

## 2019-06-10 MED ORDER — MORPHINE SULFATE (PF) 4 MG/ML IV SOLN
4.0000 mg | Freq: Once | INTRAVENOUS | Status: AC
  Administered 2019-06-10: 4 mg via INTRAVENOUS
  Filled 2019-06-10: qty 1

## 2019-06-10 MED ORDER — IOHEXOL 300 MG/ML  SOLN
100.0000 mL | Freq: Once | INTRAMUSCULAR | Status: AC | PRN
  Administered 2019-06-10: 100 mL via INTRAVENOUS

## 2019-06-10 NOTE — ED Triage Notes (Signed)
Pt came in [facility] from home c/o of CP that started yesterday and has progressively got worse. EMS reports BP of 200/100. 2Nitro Given in route, 324ASA taken by patient at home. 84HR Hx. Of HTN, MI, and a Cardiac Stent

## 2019-06-10 NOTE — ED Provider Notes (Signed)
[facility] EMERGENCY DEPARTMENT Provider Note   CSN: [id number] Arrival date & time: 06/10/19  0251   History Chief Complaint  Patient presents with   Chest Pain    [name] [name] is a 84 y.o. female.  The history is provided by the patient.  Chest Pain She has history of coronary artery disease status post coronary stent, chronic back pain, chronic kidney disease, GERD, hiatus hernia status postop fundoplication and comes in with pain in the left flank radiating across her upper abdomen for approximately the last week but got significantly worse tonight.  She rates her pain between 8/10 and 10/10.  There is associated nausea but no vomiting.  Pain is worse when she stands up but nothing makes it better.  She has taken acetaminophen for pain without relief.  She denies constipation or diarrhea.  She denies any urinary difficulty.  She denies fever, chills, sweats.  Past Medical History:  Diagnosis Date   Anxiety    takes Xanax daily as needed   Asthma    Albuterol daily as needed   Blood transfusion without reported diagnosis    CAD (coronary artery disease)    LHC 2003 with 40% pLAD, 30% mLAD, 100% D1 (moderate vessel), 100%  D2 (small vessel), 95% mid small OM2.  Pt had PTCA to D1;  D2 and OM2 small vessels and tx medically;  Last Myoview (2012): anterior infarct seen with scar, no ischemia. EF normal. Med Rx // Myoview (12/14):  Low risk; ant scar w/ peri-infarct ischemia; EF 55% with ant AK // Myoview 5/16: EF 55-65, ant, apical scar, no ischemia    Cataract    Chronic back pain    HNP, DDD, spinal stenosis, vertebral compression fractures.    Chronic nausea    takes Zofran daily as needed.  normal gastric emptying study 03/2013   CKD (chronic kidney disease), stage III 09/27/2017   Constipation    felt to be functional. takes Senokot and Miralax daily.  treated with Linzess in past.    DIVERTICULOSIS, COLON 08/28/2008   Qualifier: Diagnosis of  By:  [name], [name], [name]     Elevated LFTs 2015   probably med induced DILI, from Augmentin vs Pravastatin   GERD (gastroesophageal reflux disease)    hx of esophageal stricture    Hiatal hernia    Paraesophageal hernias. s/p fundoplications in 0000000 and 04/2013   History of bronchitis several of yrs ago   History of colon polyps 03/2009, 03/2011   adenomatous colon polyps, no high grade dysplasia.    History of vertigo    no meds   Hyperlipidemia    takes Pravastatin daily   Hypertension    takes Amlodipine,Metoprolol,and Losartan daily   Nausea 03/2016   Osteoporosis    PONV (postoperative nausea and vomiting)    yrs ago   Slow urinary stream    takes Rapaflo daily   TIA (transient ischemic attack) 1995   no residual effects noted    Patient Active Problem List   Diagnosis Date Noted   Nausea & vomiting 05/13/2018   Abnormal glucose 03/01/2018   FHx: heart disease 03/01/2018   Therapeutic opioid-induced constipation (OIC) 12/21/2017   Abnormal UGI series 12/21/2017   CKD (chronic kidney disease) stage 3, GFR 30-59 ml/min 09/27/2017   T12 compression fracture ([place]) 09/26/2017   Chronic bronchitis ([place]) 08/09/2017   Atherosclerosis of aorta ([place]) 04/05/2017   Anxiety state 06/08/2016   Spinal stenosis, lumbar region, with neurogenic claudication 05/29/2015  DDD (degenerative disc disease), lumbar 04/21/2015   Hyperlipidemia, mixed 11/24/2014   Gastroesophageal reflux disease 11/24/2014   Depression, major, in partial remission ([place]) 09/26/2014   Chronic pain syndrome 09/26/2014   Coronary artery disease due to lipid rich plaque    Osteoporosis 02/10/2014   Prediabetes 08/14/2013   Vitamin D deficiency 08/14/2013   MGUS (monoclonal gammopathy of unknown significance) 03/05/2013   Vertebral compression fracture ([place]) 06/23/2012   ESOPHAGEAL STRICTURE 08/28/2008   Mild intermittent asthma without complication [id number]   Incarcerated  recurrent paraesophageal hiatal hernia s/p lap redo repair [id number] 01/31/2008   Essential hypertension 07/25/2007    Past Surgical History:  Procedure Laterality Date   APPENDECTOMY     patient unsure of date   [place]     Patient unsure of date.   CORONARY ANGIOPLASTY  11/16/2001   1 stent   ESOPHAGEAL MANOMETRY N/A 03/25/2013   Procedure: ESOPHAGEAL MANOMETRY (EM);  Surgeon: Inda Castle, MD;  Location: [facility] ENDOSCOPY;  Service: Endoscopy;  Laterality: N/A;   ESOPHAGOGASTRODUODENOSCOPY N/A 03/15/2013   Procedure: ESOPHAGOGASTRODUODENOSCOPY (EGD);  Surgeon: Jerene Bears, MD;  Location: [facility] ENDOSCOPY;  Service: Endoscopy;  Laterality: N/A;   ESOPHAGOGASTRODUODENOSCOPY N/A 12/25/2015   Procedure: ESOPHAGOGASTRODUODENOSCOPY (EGD);  Surgeon: Doran Stabler, MD;  Location: [facility] ENDOSCOPY;  Service: Endoscopy;  Laterality: N/A;   EYE SURGERY  11/2000   bilateral cataracts with lens implant   FIXATION KYPHOPLASTY LUMBAR SPINE  X 2   "L3-4"   HEMORRHOID SURGERY     HIATAL HERNIA REPAIR  06/03/2011   Procedure: LAPAROSCOPIC REPAIR OF HIATAL HERNIA;  Surgeon: Adin Hector, MD;  Location: [facility] ORS;  Service: General;  Laterality: N/A;   HIATAL HERNIA REPAIR N/A 04/24/2013   Procedure: LAPAROSCOPIC  REPAIR RECURRENT PARASOPHAGEAL HIATAL HERNIA WITH FUNDOPLICATION;  Surgeon: Adin Hector, MD;  Location: [facility] ORS;  Service: General;  Laterality: N/A;   INSERTION OF MESH N/A 04/24/2013   Procedure: INSERTION OF MESH;  Surgeon: Adin Hector, MD;  Location: [facility] ORS;  Service: General;  Laterality: N/A;   IR KYPHO THORACIC WITH BONE BIOPSY  09/29/2017   IR KYPHO THORACIC WITH BONE BIOPSY  11/03/2017   IR KYPHO THORACIC WITH BONE BIOPSY  02/14/2019   IR KYPHO THORACIC WITH BONE BIOPSY  04/02/2019   LAPAROSCOPIC LYSIS OF ADHESIONS N/A 04/24/2013   Procedure: LAPAROSCOPIC LYSIS OF ADHESIONS;  Surgeon: Adin Hector, MD;  Location: [facility]  ORS;  Service: General;  Laterality: N/A;   LAPAROSCOPIC NISSEN FUNDOPLICATION  [id number]   Procedure: LAPAROSCOPIC NISSEN FUNDOPLICATION;  Surgeon: Adin Hector, MD;  Location: [facility] ORS;  Service: General;  Laterality: N/A;   LUMBAR FUSION  12/07/2015   Right L2-3 facetectomy, posterolateral fusion L2-3 with pedicle screws, rods, local bone graft, Vivigen cancellous chips. Fusion extended to the L1 level with pedicle screws and rods   LUMBAR LAMINECTOMY/DECOMPRESSION MICRODISCECTOMY Right 06/26/2012   Procedure: LUMBAR LAMINECTOMY/DECOMPRESSION MICRODISCECTOMY;  Surgeon: Jessy Oto, MD;  Location: [place];  Service: Orthopedics;  Laterality: Right;  RIGHT L4-5 MICRODISCECTOMY   LUMBAR LAMINECTOMY/DECOMPRESSION MICRODISCECTOMY N/A 05/29/2015   Procedure: RIGHT L2-3 MICRODISCECTOMY;  Surgeon: Jessy Oto, MD;  Location: [place];  Service: Orthopedics;  Laterality: N/A;   TOTAL ABDOMINAL HYSTERECTOMY  1975   partial   UPPER GASTROINTESTINAL ENDOSCOPY       OB History   No obstetric history on file.     Family History  Problem Relation Age of Onset   Colon cancer Mother 57   Heart attack Father 72       heart attack   Brain cancer Brother        tumor    Heart disease Brother    Heart disease Brother    Kidney disease Daughter    Esophageal cancer Neg Hx    Stomach cancer Neg Hx     Social History   Tobacco Use   Smoking status: Never Smoker   Smokeless tobacco: Never Used  Substance Use Topics   Alcohol use: No   Drug use: Never    Home Medications Prior to Admission medications   Medication Sig Start Date End Date Taking? Authorizing Provider  acetaminophen (TYLENOL) 500 MG tablet Take 1 tablet (500 mg total) by mouth every 6 (six) hours as needed. 03/18/18   Bast, Tressia Miners A, NP  acetaminophen-codeine (TYLENOL #3) 300-30 MG tablet Take 1-2 tablets by mouth every 8 (eight) hours as needed for moderate pain. 01/21/19   Mcarthur Rossetti, MD    ALPRAZolam Duanne Moron) 0.5 MG tablet Take 1/2 tablet 1- 2 x /day ONLY if needed for Anxiety Attack &  limit to 5 days /week to avoid Addiction - DO NOT take with Hydrocodone or Codeine 04/20/19   [name], MD  amLODipine (NORVASC) 10 MG tablet Take 1 tablet Daily for BP & Heart 02/07/19   [name], MD  aspirin EC 81 MG tablet Take 81 mg by mouth at bedtime.     [provider]  BREO ELLIPTA 100-25 MCG/INH AEPB INHALE 1 PUFF BY MOUTH ONCE DAILY. RINSE MOUTH WITH WATER AFTER EACH USE. 03/25/19   [name], NP  Cholecalciferol (VITAMIN D3) 5000 units CAPS Take 5,000 Units by mouth 2 (two) times daily.     [provider]  Coral Calcium 1000 (390 Ca) MG TABS Take 1,000 mg by mouth daily. 12/14/18   [name], NP  denosumab (PROLIA) 60 MG/ML SOSY injection Inject 60 mg into the skin every 6 (six) months. 03/05/19   [name], MD  diclofenac sodium (VOLTAREN) 1 % GEL Apply 2 g topically 4 (four) times daily. 03/18/18   Loura Halt A, NP  gabapentin (NEURONTIN) 800 MG tablet Take 1/2 to 1 tablet 3 x /day for Pain 05/20/19   [name], MD  HYDROcodone-acetaminophen (NORCO) 7.5-325 MG tablet Take 1 tablet by mouth every 6 (six) hours as needed for moderate pain. 04/19/19   Marybelle Killings, MD  ipratropium-albuterol (DUONEB) 0.5-2.5 (3) MG/3ML SOLN Take 3 mLs by nebulization every 6 (six) hours as needed (shortness of breath/wheezing).     [provider]  ketoconazole (NIZORAL) 2 % cream  APPLY ONE APPLICATION TOPICALLY TWO TIMES DAILY 04/02/18   [name], MD  Magnesium 400 MG TABS Take 400 mg by mouth daily.    [provider]  meclizine (ANTIVERT) 25 MG tablet Take 1/2 to 1 tablet 2 to 3 x /day ONLY if needed  For Dizziness / Vertigo 06/28/18   [name], MD  metoprolol tartrate (LOPRESSOR) 50 MG tablet Take 1 tablet 2 x /day for BP & Heart 12/23/18   [name], MD  mirabegron ER (MYRBETRIQ) 25 MG TB24 tablet Take 25  mg by mouth daily.    [provider]  nitroGLYCERIN (NITROSTAT) 0.4 MG SL tablet Place 1 tablet (0.4 mg total) under the tongue every 5 (five) minutes as needed for chest pain. 09/04/14   Larey Dresser, MD  omeprazole (PRILOSEC) 40 MG capsule Take 1 capsule 2 x /day for Indigestion , Heartburn & Nausea 04/18/19   [name], MD  ondansetron (ZOFRAN) 4 MG tablet Take 1 tablet (4 mg total) by mouth every 8 (eight) hours as needed for nausea or vomiting. 05/16/18   Thurnell Lose, MD  polyethylene glycol powder (GLYCOLAX/MIRALAX) powder Take 17 g by mouth 2 (two) times daily. 05/16/17   Armbruster, Carlota Raspberry, MD  pravastatin (PRAVACHOL) 40 MG tablet Take 40 mg by mouth daily. 12/25/18   [provider]  promethazine (PHENERGAN) 25 MG tablet Take 1 tablet every 4 hours if needed for Nausea or Vomitting 06/05/19   [name], NP  senna-docusate (SENOKOT-S) 8.6-50 MG tablet Take 2 tablets by mouth 2 (two) times daily. 12/26/15   Roxan Hockey, MD  sucralfate (CARAFATE) 1 g tablet Take 1 tablet 2 to 4 x /day before meals for Heartburn 04/17/18   [name], MD    Allergies    Ace inhibitors, Aspartame, Atorvastatin, Biaxin [clarithromycin], Daliresp [roflumilast], Erythromycin, and Levofloxacin  Review of Systems   Review of Systems  Cardiovascular: Positive for chest pain.  All other systems reviewed and are negative.   Physical Exam Updated Vital Signs BP (!) 141/80 (BP Location: Left Arm)    Pulse 80    Temp 97.8 F (36.6 C) (Oral)    Resp 18    Ht 5\' 4"  (1.626 m)    Wt 73.9 kg    SpO2 100%    BMI 27.98 kg/m   Physical Exam Vitals and nursing note reviewed.   84 year old female, resting comfortably and in no acute distress. Vital signs are significant for borderline elevated blood pressure. Oxygen saturation is 100%, which is normal. Head is normocephalic and atraumatic. PERRLA, EOMI. Oropharynx is clear. Neck is nontender and supple without adenopathy  or JVD. Back is nontender in the midline.  There is moderate left CVA tenderness. Lungs are clear without rales, wheezes, or rhonchi. Chest is nontender. Heart has regular rate and rhythm without murmur. Abdomen is soft, flat, with moderate tenderness across the upper abdomen and maximum tenderness in the epigastric area.  There is no rebound or guarding.  There are no masses or hepatosplenomegaly and peristalsis is slightly hypoactive. Extremities have no cyanosis or edema, full range of motion is present. Skin is warm and dry without rash. Neurologic: Mental status is normal, cranial nerves are intact, there are no motor or sensory deficits.  ED Results / Procedures / Treatments   Labs (all labs ordered are listed, but only abnormal results are displayed) Labs Reviewed  BASIC METABOLIC PANEL - Abnormal; Notable for the following components:      Result Value   Glucose, Bld 118 (*)    All other components within normal limits  CBC - Abnormal; Notable for the following components:   Hemoglobin 15.4 (*)    HCT 47.0 (*)    All other components within normal limits  LIPASE, BLOOD  HEPATIC FUNCTION PANEL  TROPONIN I (HIGH SENSITIVITY)  TROPONIN I (HIGH SENSITIVITY)    EKG EKG Interpretation  Date/Time:  Monday June 10 2019 02:58:28 EST Ventricular Rate:  72 PR Interval:    QRS Duration: 91 QT Interval:  404 QTC Calculation: 443 R Axis:   -11 Text Interpretation: Sinus rhythm Low voltage, precordial leads Abnormal R-wave progression, late transition When compared with ECG of 05/12/2018, Sinus rhythm has replaced Atrial fibrillation with rapid ventricular response ST depression has resolved - was  probably rate-related Axis has shifted leftward, but is similar to prior ECG's Confirmed by Delora Fuel ([id number]) on 06/10/2019 3:04:33 AM   Radiology DG Chest 2 View  Result Date: 06/10/2019 CLINICAL DATA:  Chest pain EXAM: CHEST - 2 VIEW COMPARISON:  04/29/2019 FINDINGS: Cardiac shadow  is enlarged but accentuated by the frontal technique. Lungs are well aerated bilaterally. No focal infiltrate or sizable effusion is seen. Changes of prior vertebral augmentation are seen. Prior fusion is noted in the lower thoracic spine IMPRESSION: No active cardiopulmonary disease. Electronically Signed   By: Inez Catalina M.D.   On: 06/10/2019 03:27   US Abdomen Complete  Result Date: 06/10/2019 CLINICAL DATA:  Elevated liver function studies. Abdominal pain for 2 weeks. History of cholecystectomy. EXAM: ABDOMEN ULTRASOUND COMPLETE COMPARISON:  CT scan 06/10/2019 FINDINGS: Gallbladder: Surgically absent. Common bile duct: Diameter: 11.3 mm. Chronic post cholecystectomy dilatation. No common bile duct stones. Liver: Normal echogenicity without focal lesion or biliary dilatation. Portal vein is patent on color Doppler imaging with normal direction of blood flow towards the liver. IVC: Normal caliber. Pancreas: Limited visualization but appears normal on recent CT scan. Spleen: Normal size.  No focal lesions. Right Kidney: Length: 9.6 cm. Normal renal cortical thickness and echogenicity without focal lesions or hydronephrosis. Left Kidney: Length: 9.4 cm. Normal renal cortical thickness and echogenicity without focal lesions or hydronephrosis. Abdominal aorta: Normal caliber. Atherosclerotic calcifications noted. Other findings: No ascites. IMPRESSION: 1. Chronic post cholecystectomy intra and extrahepatic biliary dilatation. 2. Otherwise, unremarkable abdominal ultrasound examination. Electronically Signed   By: Marijo Sanes M.D.   On: 06/10/2019 06:55   CT ABDOMEN PELVIS W CONTRAST  Result Date: 06/10/2019 CLINICAL DATA:  Initial evaluation for acute abdominal pain, diverticulitis suspected. EXAM: CT ABDOMEN AND PELVIS WITH CONTRAST TECHNIQUE: Multidetector CT imaging of the abdomen and pelvis was performed using the standard protocol following bolus administration of intravenous contrast. CONTRAST:  162mL  OMNIPAQUE IOHEXOL 300 MG/ML  SOLN COMPARISON:  Prior CT from 05/12/2018. FINDINGS: Lower chest: Visualized lung bases are largely clear. Hepatobiliary: 7 mm cyst noted within the left hepatic lobe, stable. Liver otherwise demonstrates a normal contrast enhanced appearance. Gallbladder surgically absent. Intra and extrahepatic biliary dilatation most likely related to post cholecystectomy changes, similar to previous. Pancreas: Mild diffuse fatty infiltration the pancreas noted. Pancreas otherwise unremarkable without acute abnormality. Spleen: Subcentimeter hypodensity at the inferior aspect of the spleen, indeterminate, but stable from previous, and of doubtful significance. Spleen otherwise unremarkable. Adrenals/Urinary Tract: Adrenal glands within normal limits. Kidneys equal in size with symmetric enhancement. Subcentimeter hypodensity within the interpolar left kidney too small the characterize, but statistically likely reflects a small cyst, stable from previous. No focal enhancing renal mass. No nephrolithiasis or hydronephrosis. No hydroureter. Bladder within normal limits. Stomach/Bowel: Postoperative changes from prior Nissen fundoplication again noted about the GE junction, stable. Stomach otherwise unremarkable. No evidence for bowel obstruction. Colonic diverticulosis without evidence for acute diverticulitis. No acute inflammatory changes seen about the bowels. Appendix not visualize, consistent with history of prior appendectomy. Vascular/Lymphatic: Mild to moderate aorto bi-iliac atherosclerotic disease. No aneurysm. Mesenteric vessels patent proximally. No pathologically enlarged intra-abdominopelvic lymph nodes. Reproductive: Uterus is absent. Ovaries not confidently identified. Other: No free air or fluid. Tiny fat containing paraumbilical hernia noted without associated inflammation. Musculoskeletal: No acute osseous abnormality. No discrete or worrisome osseous lesions. Multiple chronic  compression deformities with sequelae of prior vertebral augmentation seen throughout the visualized thoracolumbar spine. Patient is status post posterior fusion at L1 through  L3. IMPRESSION: 1. No CT evidence for acute intra-abdominal or pelvic process. 2. Colonic diverticulosis without evidence for acute diverticulitis. 3. Status post cholecystectomy with chronic intra and extrahepatic biliary dilatation, stable. 4. Chronic compression deformities with sequelae of prior vertebral augmentation throughout the visualized thoracolumbar spine. 5.  Aortic Atherosclerosis (ICD10-I70.0). Electronically Signed   By: Jeannine Boga M.D.   On: 06/10/2019 04:59    Procedures Procedures   Medications Ordered in ED Medications  morphine 4 MG/ML injection 4 mg (4 mg Intravenous Given 06/10/19 0353)  promethazine (PHENERGAN) injection 12.5 mg (12.5 mg Intravenous Given 06/10/19 0352)  iohexol (OMNIPAQUE) 300 MG/ML solution 100 mL (100 mLs Intravenous Contrast Given 06/10/19 0435)    ED Course  I have reviewed the triage vital signs and the nursing notes.  Pertinent labs & imaging results that were available during my care of the patient were reviewed by me and considered in my medical decision making (see chart for details).  MDM Rules/Calculators/A&P Left flank and upper abdominal pain of uncertain cause.  Old records are reviewed, and actually she has been seen by her primary care provider with complaints of upper abdominal pain for approximately the last 6 weeks and is scheduled for an abdominal ultrasound later today.  She had an endoscopy done October 2019 which was significant for presence Nissen fundoplication and mild erythema in the gastric antrum.  It is also noted that she has been treated for chronic back pain and is on hydrocodone-acetaminophen for that.  On review of the [place] controlled substance reporting website, it appears that she gets prescription for 20 tablets of  hydrocodone-acetaminophen every week.  Her ECG shows no ST or T changes.  Chest x-ray is unremarkable.  Will check hepatic functions and lipase and sent for CT of abdomen and pelvis.  Hepatic function studies and lipase are normal.  CT of abdomen and pelvis showed no evidence of any acute process.  Patient was scheduled to have an abdominal ultrasound done tomorrow, so ultrasound was obtained and is also unremarkable.  Patient was advised of these findings.  She is on maximum treatment for GERD, and she is to continue these medications.  Advised to follow-up with her gastroenterologist for further outpatient work-up.  Return precautions discussed.  Final Clinical Impression(s) / ED Diagnoses Final diagnoses:  Upper abdominal pain    Rx / DC Orders ED Discharge Orders    None       Delora Fuel, MD [phone]

## 2019-06-10 NOTE — Discharge Instructions (Signed)
Your evaluation today did not show the cause for your pain.  However, there was no sign of any significant problems with your liver, pancreas, or kidneys.  Please continue taking all of your medications as prescribed, and follow-up with your gastroenterologist for further evaluation.  Return to the emergency department if symptoms are getting worse.

## 2019-06-11 ENCOUNTER — Other Ambulatory Visit: Payer: PPO

## 2019-06-15 ENCOUNTER — Ambulatory Visit: Payer: PPO | Attending: Internal Medicine

## 2019-06-15 DIAGNOSIS — Z23 Encounter for immunization: Secondary | ICD-10-CM | POA: Insufficient documentation

## 2019-06-15 NOTE — Progress Notes (Signed)
   Covid-19 Vaccination Clinic  Name:  [name] [name]    MRN: [id number] DOB: 09/13/1935  06/15/2019  [name] was observed post Covid-19 immunization for 15 minutes without incidence. She was provided with Vaccine Information Sheet and instruction to access the V-Safe system.   [name] was instructed to call 911 with any severe reactions post vaccine: [name] Difficulty breathing  . Swelling of your face and throat  . A fast heartbeat  . A bad rash all over your body  . Dizziness and weakness    Immunizations Administered    Name Date Dose VIS Date Route   Pfizer COVID-19 Vaccine 06/15/2019 12:17 PM 0.3 mL 04/19/2019 Intramuscular   Manufacturer: [place]   Lot: [id number]   [place]: [id number]

## 2019-06-19 ENCOUNTER — Ambulatory Visit (INDEPENDENT_AMBULATORY_CARE_PROVIDER_SITE_OTHER): Payer: PPO | Admitting: Gastroenterology

## 2019-06-19 ENCOUNTER — Other Ambulatory Visit: Payer: Self-pay

## 2019-06-19 ENCOUNTER — Encounter: Payer: Self-pay | Admitting: Gastroenterology

## 2019-06-19 VITALS — BP 122/62 | HR 84 | Temp 97.9°F | Ht 61.5 in | Wt 170.0 lb

## 2019-06-19 DIAGNOSIS — M549 Dorsalgia, unspecified: Secondary | ICD-10-CM | POA: Diagnosis not present

## 2019-06-19 DIAGNOSIS — R109 Unspecified abdominal pain: Secondary | ICD-10-CM

## 2019-06-19 DIAGNOSIS — R933 Abnormal findings on diagnostic imaging of other parts of digestive tract: Secondary | ICD-10-CM

## 2019-06-19 DIAGNOSIS — G8929 Other chronic pain: Secondary | ICD-10-CM

## 2019-06-19 DIAGNOSIS — K5909 Other constipation: Secondary | ICD-10-CM

## 2019-06-19 MED ORDER — LINACLOTIDE 72 MCG PO CAPS: 72.0000 ug | ORAL_CAPSULE | Freq: Every day | ORAL | 0 refills | Status: DC

## 2019-06-19 MED ORDER — LINACLOTIDE 145 MCG PO CAPS: 145.0000 ug | ORAL_CAPSULE | Freq: Every day | ORAL | 0 refills | Status: DC

## 2019-06-19 NOTE — Progress Notes (Signed)
HPI :  84 year old female here for a follow-up visit for abdominal pain.  She has a history of a remote TIA, history of paraesophageal hernias in the past repaired with fundoplication, history of chronic back pain with history of chronic narcotic use, here for follow-up visit.  I have seen her in the past for a variety of pain and symptoms.  She is here for a follow-up visit after experiencing upper abdominal pain that led her to the emergency department on February 1.  She states she has been having some upper abdominal pain localized in her epigastric area and radiates into her lateral sides and also into her mid back.  She states has been going on for a few weeks but getting worse over time.  On questioning if this is just been ongoing for a few weeks or longer, she states she is thinks he has had this pain in the past for a long time but is unable to say exactly how long.  In the emergency room she had an ultrasound and a CT scan as outlined below, no clear cause for her symptoms.  She had normal CBC, LFTs, lipase.  She was instructed to follow-up with [place] for further management.  She states the pain seems to be there all the time and never really goes away.  She has a constant level of pain that rated 8 out of 10.  She has a stabbing pain that can come and go along with this but symptoms seem to be about the same.  She has no problems eating at all.  No vomiting.  She is eating meals regularly without making the symptoms worse.  She has had some baseline constipation for which she takes MiraLAX.  We previously had given her a trial of Amitiza as well as Movantik in light of her narcotic use in the past.  She cannot recall how those regimens made her feel and is unclear how she responded to them.  She denies any reflux symptoms its been bothering her at all.  She has been on omeprazole 40 mg twice a day and taking Carafate as needed in the past.  She states Carafate has not helped the pain at all.  I  have reviewed her prior radiology studies.  In the ER her CT scan showed stable mild biliary ductal dilation in the setting of postcholecystectomy state.  Ultrasound showed the duct to be about 11.3 mm.  In review of her scans in the past, she has had numerous CT scans of her abdomen and pelvis as well as ultrasounds.  Dating back to 2001 she has had some mild prominence of her biliary tree.  Her LFTs and lipase have been stable, this is been thought to be related to postcholecystectomy state.  Of note she follows with Dr. Hardin Negus of chronic pain management we have had discussions about her narcotic use in the past and that it may be best to get her off the chronic regimen for her low back pain.  She has done that in recent weeks and has come off the narcotics altogether.  She has been maintained on gabapentin for this.  She thinks her pain in her upper belly and mid back started around the time she stopped her narcotics.   Most recent workup: CT scan 06/10/2019 - IMPRESSION: 1. No CT evidence for acute intra-abdominal or pelvic process. 2. Colonic diverticulosis without evidence for acute diverticulitis. 3. Status post cholecystectomy with chronic intra and extrahepatic biliary dilatation, stable.  4. Chronic compression deformities with sequelae of prior vertebral augmentation throughout the visualized thoracolumbar spine. 5.  Aortic Atherosclerosis (ICD10-I70.0).  [place] 06/10/19 -  IMPRESSION: 1. Chronic post cholecystectomy intra and extrahepatic biliary Dilatation. CBD 11.6mm 2. Otherwise, unremarkable abdominal ultrasound examination.  CT scan 05/12/18 -  IMPRESSION: 1. No acute findings.  No findings to account abdominal pain. 2. Chronic findings include chronic intra and extrahepatic bile duct dilation, aortic atherosclerosis, and multiple vertebral fractures. CBD 83mm  Colonoscopy 05/17/2017 - The perianal and digital rectal examinations were normal. - A 4 mm polyp was found in the cecum.  The polyp was sessile. The polyp was removed with a cold snare. Resection and retrieval were complete. - Two sessile polyps were found in the ascending colon. The polyps were 4 to 5 mm in size. These polyps were removed with a cold snare. Resection and retrieval were complete. - Two sessile polyps were found in the transverse colon. The polyps were 4 to 5 mm in size. These polyps were removed with a cold snare. Resection and retrieval were complete. - A 6 mm polyp was found in the descending colon. The polyp was sessile. The polyp was removed with a cold snare. Resection and retrieval were complete. - Scattered medium-mouthed diverticula were found in the entire colon. - Internal hemorrhoids were found during retroflexion. - The colon was tortous. The exam was otherwise without abnormality.  EGD 02/20/2018 - Esophagogastric landmarks were identified: the Z-line was found at 36 cm. - The lower esophagus was tortuous with an angulated turn into the fundoplication. No obvious stenosis was appreciated. - A TTS dilator was passed through the scope. Dilation with a 16-17-18 mm balloon dilator was performed to 16 mm, 17 mm and 18 mm at the gastroesophageal junction without any appreciable mucosal wrent. - Multiple diminutive white plaques were found in the lower third of the esophagus. Brushings for KOH prep were obtained. - The exam of the esophagus was otherwise normal. - Evidence of a Nissen fundoplication was found in the cardia, with a retained suture visible. The wrap was intact. - Patchy inflammation characterized by erythema was found in the gastric antrum. Biopsies were taken with a cold forceps for histology. - The exam of the stomach was otherwise normal. - The duodenal bulb and second portion of the duodenum were normal.  EGD 01/24/2018 - Z-line, 35 cm from the incisors. - Suspect mild esophagitis dissecans - Tortous esophagus with angulated entrance to fundoplication versus mild  stenosis - dilation not performed given volume of food in the stomach - A large amount of food (residue) in the stomach, the procedure was aborted.   EGD 12/25/2015 -  Normal exam, fundoplication noted - per Dr. Loletha Carrow Esophageal manometry 08/15/2015 - intact peristalsis, normal LES relaxation, intermittent nonspecific spasm EGD 03/15/2013 - esophageal candidiasis, recurrent 4-5 cm hernia, history of Nissen with [name] lesion Colonoscopy 04/07/2011 - 2 small adenomatous polyps, internal hemorrhoids,   CT angio abdomen / pelvis - 04/05/2017 - normal CT abdomen / pelvis 03/10/2017 - mild stable CBD dilation 12mm post cholecystectomy CT abdomen pelvis 03/27/2016 - normal abdomen,  Barium swallow 12/24/15 - stricture in distal 3rd of esophagus, nonspecific dysmotility Gastric emptying study 03/2013 - normal    Past Medical History:  Diagnosis Date  . Anxiety    takes Xanax daily as needed  . Asthma    Albuterol daily as needed  . Blood transfusion without reported diagnosis   . CAD (coronary artery disease)    LHC 2003  with 40% pLAD, 30% mLAD, 100% D1 (moderate vessel), 100%  D2 (small vessel), 95% mid small OM2.  Pt had PTCA to D1;  D2 and OM2 small vessels and tx medically;  Last Myoview (2012): anterior infarct seen with scar, no ischemia. EF normal. Med Rx // Myoview (12/14):  Low risk; ant scar w/ peri-infarct ischemia; EF 55% with ant AK // Myoview 5/16: EF 55-65, ant, apical scar, no ischemia   . Cataract   . Chronic back pain    HNP, DDD, spinal stenosis, vertebral compression fractures.   . Chronic nausea    takes Zofran daily as needed.  normal gastric emptying study 03/2013  . CKD (chronic kidney disease), stage III 09/27/2017  . Constipation    felt to be functional. takes Senokot and Miralax daily.  treated with Linzess in past.   . DIVERTICULOSIS, COLON 08/28/2008   Qualifier: Diagnosis of  By: [name], [name], [name]    . Elevated LFTs 2015   probably med induced DILI, from  Augmentin vs Pravastatin  . GERD (gastroesophageal reflux disease)    hx of esophageal stricture   . Hiatal hernia    Paraesophageal hernias. s/p fundoplications in 0000000 and 04/2013  . History of bronchitis several of yrs ago  . History of colon polyps 03/2009, 03/2011   adenomatous colon polyps, no high grade dysplasia.   [name] History of vertigo    no meds  . Hyperlipidemia    takes Pravastatin daily  . Hypertension    takes Amlodipine,Metoprolol,and Losartan daily  . Nausea 03/2016  . Osteoporosis   . PONV (postoperative nausea and vomiting)    yrs ago  . Slow urinary stream    takes Rapaflo daily  . TIA (transient ischemic attack) 1995   no residual effects noted     Past Surgical History:  Procedure Laterality Date  . APPENDECTOMY     patient unsure of date  . BACK SURGERY    . BLADDER REPAIR    . CHOLECYSTECTOMY     Patient unsure of date.  . CORONARY ANGIOPLASTY  11/16/2001   1 stent  . ESOPHAGEAL MANOMETRY N/A 03/25/2013   Procedure: ESOPHAGEAL MANOMETRY (EM);  Surgeon: Inda Castle, MD;  Location: [facility];  Service: Endoscopy;  Laterality: N/A;  . ESOPHAGOGASTRODUODENOSCOPY N/A 03/15/2013   Procedure: ESOPHAGOGASTRODUODENOSCOPY (EGD);  Surgeon: Jerene Bears, MD;  Location: [facility] ENDOSCOPY;  Service: Endoscopy;  Laterality: N/A;  . ESOPHAGOGASTRODUODENOSCOPY N/A 12/25/2015   Procedure: ESOPHAGOGASTRODUODENOSCOPY (EGD);  Surgeon: Doran Stabler, MD;  Location: [facility] END[facility]COPY;  Service: Endoscopy;  Laterality: N/A;  . EYE SURGERY  11/2000   bilateral cataracts with lens implant  . FIXATION KYPHOPLASTY LUMBAR SPINE  X 2   "L3-4"  . HEMORRHOID SURGERY    . HIATAL HERNIA REPAIR  06/03/2011   Procedure: LAPAROSCOPIC REPAIR OF HIATAL HERNIA;  Surgeon: Adin Hector, MD;  Location: [facility];  Service: General;  Laterality: N/A;  . HIATAL HERNIA REPAIR N/A 04/24/2013   Procedure: LAPAROSCOPIC  REPAIR RECURRENT PARASOPHAGEAL HIATAL HERNIA WITH FUNDOPLICATION;  Surgeon:  Adin Hector, MD;  Location: [facility];  Service: General;  Laterality: N/A;  . INSERTION OF MESH N/A 04/24/2013   Procedure: INSERTION OF MESH;  Surgeon: Adin Hector, MD;  Location: [facility];  Service: General;  Laterality: N/A;  . IR KYPHO THORACIC WITH BONE BIOPSY  09/29/2017  . IR KYPHO THORACIC WITH BONE BIOPSY  11/03/2017  . IR KYPHO THORACIC WITH BONE BIOPSY  02/14/2019  .  IR KYPHO THORACIC WITH BONE BIOPSY  04/02/2019  . LAPAROSCOPIC LYSIS OF ADHESIONS N/A 04/24/2013   Procedure: LAPAROSCOPIC LYSIS OF ADHESIONS;  Surgeon: Adin Hector, MD;  Location: [facility];  Service: General;  Laterality: N/A;  . LAPAROSCOPIC NISSEN FUNDOPLICATION  [id number]   Procedure: LAPAROSCOPIC NISSEN FUNDOPLICATION;  Surgeon: Adin Hector, MD;  Location: [facility];  Service: General;  Laterality: N/A;  . LUMBAR FUSION  12/07/2015   Right L2-3 facetectomy, posterolateral fusion L2-3 with pedicle screws, rods, local bone graft, Vivigen cancellous chips. Fusion extended to the L1 level with pedicle screws and rods  . LUMBAR LAMINECTOMY/DECOMPRESSION MICRODISCECTOMY Right 06/26/2012   Procedure: LUMBAR LAMINECTOMY/DECOMPRESSION MICRODISCECTOMY;  Surgeon: Jessy Oto, MD;  Location: [facility];  Service: Orthopedics;  Laterality: Right;  RIGHT L4-5 MICRODISCECTOMY  . LUMBAR LAMINECTOMY/DECOMPRESSION MICRODISCECTOMY N/A 05/29/2015   Procedure: RIGHT L2-3 MICRODISCECTOMY;  Surgeon: Jessy Oto, MD;  Location: [facility];  Service: Orthopedics;  Laterality: N/A;  . TOTAL ABDOMINAL HYSTERECTOMY  1975   partial  . UPPER GASTROINTESTINAL ENDOSCOPY     Family History  Problem Relation Age of Onset  . Colon cancer Mother 30  . Heart attack Father 1       heart attack  . Brain cancer Brother        tumor   . Heart disease Brother   . Heart disease Brother   . Kidney disease Daughter   . Esophageal cancer Neg Hx   . Stomach cancer Neg Hx    Social History   Tobacco Use  . Smoking status: Never Smoker  . Smokeless  tobacco: Never Used  Substance Use Topics  . Alcohol use: No  . Drug use: Never   Current Outpatient Medications  Medication Sig Dispense Refill  . acetaminophen (TYLENOL) 500 MG tablet Take 1 tablet (500 mg total) by mouth every 6 (six) hours as needed. 30 tablet 0  . ALPRAZolam (XANAX) 0.5 MG tablet Take 1/2 tablet 1- 2 x /day ONLY if needed for Anxiety Attack &  limit to 5 days /week to avoid Addiction - DO NOT take with Hydrocodone or Codeine (Patient taking differently: Take 0.25-0.5 mg by mouth daily as needed for anxiety. Take 1/2 tablet 1- 2 x /day ONLY if needed for Anxiety Attack &  limit to 5 days /week to avoid Addiction - DO NOT take with Hydrocodone or Codeine) 60 tablet 0  . amLODipine (NORVASC) 10 MG tablet Take 1 tablet Daily for BP & Heart 90 tablet 3  . ascorbic acid (VITAMIN C) 500 MG tablet Take 500 mg by mouth 2 (two) times daily.    [name] aspirin EC 81 MG tablet Take 81 mg by mouth at bedtime.     [name] BREO ELLIPTA 100-25 MCG/INH AEPB INHALE 1 PUFF BY MOUTH ONCE DAILY. RINSE MOUTH WITH WATER AFTER EACH USE. 60 each 5  . Cholecalciferol (VITAMIN D3) 5000 units CAPS Take 5,000 Units by mouth 2 (two) times daily.     [name] Coral Calcium 1000 (390 Ca) MG TABS Take 1,000 mg by mouth daily. 60 tablet   . denosumab (PROLIA) 60 MG/ML SOSY injection Inject 60 mg into the skin every 6 (six) months. 1 mL 0  . diclofenac sodium (VOLTAREN) 1 % GEL Apply 2 g topically 4 (four) times daily. (Patient taking differently: Apply 2 g topically 4 (four) times daily as needed (pain). ) 1 Tube 1  . gabapentin (NEURONTIN) 800 MG tablet Take 1/2 to 1 tablet 3 x /day for  Pain (Patient taking differently: Take 400 mg by mouth in the morning, at noon, in the evening, and at bedtime. 830, 1230, 430, 830p) 90 tablet 0  . ipratropium-albuterol (DUONEB) 0.5-2.5 (3) MG/3ML SOLN Take 3 mLs by nebulization every 6 (six) hours as needed (shortness of breath/wheezing).     [name] ketoconazole (NIZORAL) 2 % cream  APPLY ONE  APPLICATION TOPICALLY TWO TIMES DAILY (Patient taking differently: Apply 1 application topically 2 (two) times daily as needed for irritation. ) 30 g 1  . Magnesium 400 MG TABS Take 400 mg by mouth 2 (two) times daily.     . meclizine (ANTIVERT) 25 MG tablet Take 1/2 to 1 tablet 2 to 3 x /day ONLY if needed  For Dizziness / Vertigo (Patient taking differently: Take 12.5-25 mg by mouth 3 (three) times daily as needed for dizziness. Take 1/2 to 1 tablet 2 to 3 x /day ONLY if needed  For Dizziness / Vertigo) 270 tablet 1  . metoprolol tartrate (LOPRESSOR) 50 MG tablet Take 1 tablet 2 x /day for BP & Heart (Patient taking differently: Take 50 mg by mouth 2 (two) times daily. Take 1 tablet 2 x /day for BP & Heart) 180 tablet 3  . mirabegron ER (MYRBETRIQ) 25 MG TB24 tablet Take 25 mg by mouth daily.    . Multiple Vitamins-Minerals (MULTIVITAMIN WITH MINERALS) tablet Take 1 tablet by mouth daily.    . nitroGLYCERIN (NITROSTAT) 0.4 MG SL tablet Place 1 tablet (0.4 mg total) under the tongue every 5 (five) minutes as needed for chest pain. 90 tablet 3  . omeprazole (PRILOSEC) 40 MG capsule Take 1 capsule 2 x /day for Indigestion , Heartburn & Nausea (Patient taking differently: Take 40 mg by mouth 2 (two) times daily. Take 1 capsule 2 x /day for Indigestion , Heartburn & Nausea) 180 capsule 3  . polyethylene glycol powder (GLYCOLAX/MIRALAX) powder Take 17 g by mouth 2 (two) times daily. 527 g 0  . pravastatin (PRAVACHOL) 40 MG tablet Take 40 mg by mouth daily.    . promethazine (PHENERGAN) 25 MG tablet Take 1 tablet every 4 hours if needed for Nausea or Vomitting 50 tablet 0  . senna-docusate (SENOKOT-S) 8.6-50 MG tablet Take 2 tablets by mouth 2 (two) times daily. 60 tablet 1  . sucralfate (CARAFATE) 1 g tablet Take 1 tablet 2 to 4 x /day before meals for Heartburn 360 tablet 1  . vitamin B-12 (CYANOCOBALAMIN) 1000 MCG tablet Take 1,000 mcg by mouth daily.    [name] zinc gluconate 50 MG tablet Take 50 mg by mouth  daily.     Current Facility-Administered Medications  Medication Dose Route Frequency Provider Last Rate Last Admin  . calcitonin (salmon) (MIACALCIN/FORTICAL) nasal spray 1 spray  1 spray Alternating Nares Daily Jessy Oto, MD       Allergies  Allergen Reactions  . Ace Inhibitors Cough    Per Dr Melvyn Novas pulmonology 2013  . Aspartame Diarrhea and Nausea And Vomiting  . Atorvastatin Other (See Comments)    Muscle aches  . Biaxin [Clarithromycin] Other (See Comments)    Unknown reaction : PATIENT CAN TOLERATE Z-PAK.  Is written on patient's paper chart.  [name] [Roflumilast] Nausea And Vomiting  . Erythromycin Other (See Comments)    Unknown reaction: PATIENT CAN TOLERATE Z-PAK.  It is written on patient's paper chart.  . Levofloxacin Nausea And Vomiting     Review of Systems: All systems reviewed and negative except where noted in HPI.  DG Chest 2 View  Result Date: 06/10/2019 CLINICAL DATA:  Chest pain EXAM: CHEST - 2 VIEW COMPARISON:  04/29/2019 FINDINGS: Cardiac shadow is enlarged but accentuated by the frontal technique. Lungs are well aerated bilaterally. No focal infiltrate or sizable effusion is seen. Changes of prior vertebral augmentation are seen. Prior fusion is noted in the lower thoracic spine IMPRESSION: No active cardiopulmonary disease. Electronically Signed   By: Inez Catalina M.D.   On: 06/10/2019 03:27   US Abdomen Complete  Result Date: 06/10/2019 CLINICAL DATA:  Elevated liver function studies. Abdominal pain for 2 weeks. History of cholecystectomy. EXAM: ABDOMEN ULTRASOUND COMPLETE COMPARISON:  CT scan 06/10/2019 FINDINGS: Gallbladder: Surgically absent. Common bile duct: Diameter: 11.3 mm. Chronic post cholecystectomy dilatation. No common bile duct stones. Liver: Normal echogenicity without focal lesion or biliary dilatation. Portal vein is patent on color Doppler imaging with normal direction of blood flow towards the liver. IVC: Normal caliber. Pancreas:  Limited visualization but appears normal on recent CT scan. Spleen: Normal size.  No focal lesions. Right Kidney: Length: 9.6 cm. Normal renal cortical thickness and echogenicity without focal lesions or hydronephrosis. Left Kidney: Length: 9.4 cm. Normal renal cortical thickness and echogenicity without focal lesions or hydronephrosis. Abdominal aorta: Normal caliber. Atherosclerotic calcifications noted. Other findings: No ascites. IMPRESSION: 1. Chronic post cholecystectomy intra and extrahepatic biliary dilatation. 2. Otherwise, unremarkable abdominal ultrasound examination. Electronically Signed   By: Marijo Sanes M.D.   On: 06/10/2019 06:55   CT ABDOMEN PELVIS W CONTRAST  Result Date: 06/10/2019 CLINICAL DATA:  Initial evaluation for acute abdominal pain, diverticulitis suspected. EXAM: CT ABDOMEN AND PELVIS WITH CONTRAST TECHNIQUE: Multidetector CT imaging of the abdomen and pelvis was performed using the standard protocol following bolus administration of intravenous contrast. CONTRAST:  146mL OMNIPAQUE IOHEXOL 300 MG/ML  SOLN COMPARISON:  Prior CT from 05/12/2018. FINDINGS: Lower chest: Visualized lung bases are largely clear. Hepatobiliary: 7 mm cyst noted within the left hepatic lobe, stable. Liver otherwise demonstrates a normal contrast enhanced appearance. Gallbladder surgically absent. Intra and extrahepatic biliary dilatation most likely related to post cholecystectomy changes, similar to previous. Pancreas: Mild diffuse fatty infiltration the pancreas noted. Pancreas otherwise unremarkable without acute abnormality. Spleen: Subcentimeter hypodensity at the inferior aspect of the spleen, indeterminate, but stable from previous, and of doubtful significance. Spleen otherwise unremarkable. Adrenals/Urinary Tract: Adrenal glands within normal limits. Kidneys equal in size with symmetric enhancement. Subcentimeter hypodensity within the interpolar left kidney too small the characterize, but  statistically likely reflects a small cyst, stable from previous. No focal enhancing renal mass. No nephrolithiasis or hydronephrosis. No hydroureter. Bladder within normal limits. Stomach/Bowel: Postoperative changes from prior Nissen fundoplication again noted about the GE junction, stable. Stomach otherwise unremarkable. No evidence for bowel obstruction. Colonic diverticulosis without evidence for acute diverticulitis. No acute inflammatory changes seen about the bowels. Appendix not visualize, consistent with history of prior appendectomy. Vascular/Lymphatic: Mild to moderate aorto bi-iliac atherosclerotic disease. No aneurysm. Mesenteric vessels patent proximally. No pathologically enlarged intra-abdominopelvic lymph nodes. Reproductive: Uterus is absent. Ovaries not confidently identified. Other: No free air or fluid. Tiny fat containing paraumbilical hernia noted without associated inflammation. Musculoskeletal: No acute osseous abnormality. No discrete or worrisome osseous lesions. Multiple chronic compression deformities with sequelae of prior vertebral augmentation seen throughout the visualized thoracolumbar spine. Patient is status post posterior fusion at L1 through L3. IMPRESSION: 1. No CT evidence for acute intra-abdominal or pelvic process. 2. Colonic diverticulosis without evidence for acute diverticulitis. 3. Status post cholecystectomy with  chronic intra and extrahepatic biliary dilatation, stable. 4. Chronic compression deformities with sequelae of prior vertebral augmentation throughout the visualized thoracolumbar spine. 5.  Aortic Atherosclerosis (ICD10-I70.0). Electronically Signed   By: Jeannine Boga M.D.   On: 06/10/2019 04:59   Lab Results  Component Value Date   WBC 7.5 06/10/2019   HGB 15.4 (H) 06/10/2019   HCT 47.0 (H) 06/10/2019   MCV 95.5 06/10/2019   PLT 330 06/10/2019    Lab Results  Component Value Date   CREATININE 0.85 06/10/2019   BUN 15 06/10/2019   NA  141 06/10/2019   K 4.0 06/10/2019   CL 105 06/10/2019   CO2 22 06/10/2019    Lab Results  Component Value Date   ALT 15 06/10/2019   AST 20 06/10/2019   ALKPHOS 54 06/10/2019   BILITOT 0.7 06/10/2019   Lab Results  Component Value Date   LIPASE 19 06/10/2019     Physical Exam: BP 122/62 (BP Location: Left Arm, Patient Position: Sitting, Cuff Size: Normal)   Pulse 84   Temp 97.9 F (36.6 C)   Ht 5' 1.5" (1.562 m) Comment: height measured without shoes  Wt 170 lb (77.1 kg)   BMI 31.60 kg/m  Constitutional: Pleasant,well-developed, female in no acute distress. HEENT: Normocephalic and atraumatic. Conjunctivae are normal. No scleral icterus. Neck supple.  Cardiovascular: Normal rate, regular rhythm.  Pulmonary/chest: Effort normal and breath sounds normal. No wheezing, rales or rhonchi. Abdominal: Soft, nondistended, diffuse epigastric TTP and in costal margins, carnett made worse.  There are no masses palpable. . Extremities: no edema Lymphadenopathy: No cervical adenopathy noted. Neurological: Alert and oriented to person place and time. Skin: Skin is warm and dry. No rashes noted. Psychiatric: Normal mood and affect. Behavior is normal.   ASSESSMENT AND PLAN: 84 year old female here for reassessment of following issues:  Abdominal pain / chronic back pain - detailed history as above.  Her imaging does not show any new or focal abnormalities to account for her symptoms.  Her LFTs and lipase are normal as well as CBC.  I discussed with her that she has some mild dilation of the biliary tree postcholecystectomy, this appears benign, has been present chronically for several years now without any interval changes that are concerning.  I do not think this is causing her symptoms at present.  She has a significant amount of back pain, I am wondering if she has neuropathic pain/musculoskeletal etiology causing her abdominal pain.  Her Carnett's sign is positive.  Her pain  started/got worse around the time of stopping her narcotics which could have unmasked worsening pain.  Unfortunately gabapentin has not provided benefit.  I do think she should follow-up with her pain management physician to discuss other options, including trigger point injection or other medications such as Cymbalta etc.  I have reassured her about her imaging and lab findings.  We discussed the role of MRCP to further evaluate the biliary tree if needed, I think that is unlikely to show [place] anything concerning or cause for her pain, she is also having difficulty paying the bills for her prior MRIs of her back, and will hold off on imaging at this time.  The pain does not appear consistent with reflux and given the characteristic of it not related to her prandial state at all, I think the likelihood of EGD finding in etiology is lower.  Spent several minutes discussing the situation with them and they understood and agreed to follow-up with pain management.  If symptoms persist or change, I asked her to contact me for reassessment.  She agreed  Abnormal findings on imaging - as above, stable mild dilation of the biliary tree over several years, normal LFTs / lipase, likely related to post cholecystectomy state.  Chronic constipation - I do not think causing her pain but I do think she warrants more aggressive regimen and as she still has some constipation despite MiraLAX a few times a day.  We will give her a trial of Linzess 72 mcg a day.  If this is not strong enough she can increase to 145 mcg.  If she is happy with this regimen and wants a prescription for she should contact us.  She agreed  I spent 45 minutes of time, including in depth chart review, independent review of results as outlined above, communicating results with the patient directly, face-to-face time with the patient, coordinating care, and ordering studies and medications as appropriate, and documenting this encounter.  [place] Cellar, MD [facility]

## 2019-06-19 NOTE — Patient Instructions (Addendum)
If you are age 82 or older, your body mass index should be between 23-30. Your Body mass index is 31.6 kg/m. If this is out of the aforementioned range listed, please consider follow up with your Primary Care Provider.  If you are age 101 or younger, your body mass index should be between 19-25. Your Body mass index is 31.6 kg/m. If this is out of the aformentioned range listed, please consider follow up with your Primary Care Provider.   We have given you samples of the following medication to take: Linzess 72 mcg: Take daily in the morning.  If this is not effective you can take Linzess 121mcg once daily in the morning.  Follow up with pain management. Let us know if you are not improved and we may consider an endoscopy procedure.  Thank you for entrusting me with your care and for choosing [facility], Dr.  Cellar

## 2019-06-24 DIAGNOSIS — G894 Chronic pain syndrome: Secondary | ICD-10-CM | POA: Diagnosis not present

## 2019-06-24 DIAGNOSIS — M150 Primary generalized (osteo)arthritis: Secondary | ICD-10-CM | POA: Diagnosis not present

## 2019-06-24 DIAGNOSIS — M961 Postlaminectomy syndrome, not elsewhere classified: Secondary | ICD-10-CM | POA: Diagnosis not present

## 2019-06-24 DIAGNOSIS — M4726 Other spondylosis with radiculopathy, lumbar region: Secondary | ICD-10-CM | POA: Diagnosis not present

## 2019-06-26 ENCOUNTER — Ambulatory Visit: Payer: PPO | Admitting: Specialist

## 2019-06-26 NOTE — Progress Notes (Deleted)
MEDICARE ANNUAL WELLNESS VISIT AND FOLLOW UP  Assessment:   Medicare annual wellness visit 1 year  Essential hypertension - continue medications, DASH diet, exercise and monitor at home. Call if greater than 130/80.  - CBC with Differential/Platelet - CMP/GFR - TSH  Prediabetes Discussed general issues about diabetes pathophysiology and management., Educational material distributed., Suggested low cholesterol diet., Encouraged aerobic exercise., Discussed foot care., Reminded to get yearly retinal exam. - Hemoglobin A1c q30m   Mixed hyperlipidemia -continue medications, check lipids, decrease fatty foods, increase activity.  - Lipid panel  Coronary artery disease due to lipid rich plaque Control blood pressure, cholesterol, glucose, increase exercise.  - Lipid panel - Hemoglobin A1c  Recurrent major depressive disorder, in partial remission (HCC) Remission, negative screen ***  Atherosclerosis of aorta (HCC) Per numerous CTs Control blood pressure, cholesterol, glucose, increase exercise.   Incarcerated recurrent paraesophageal hiatal hernia s/p lap redo repair [phone] Improved, continue PPI   Asthma, unspecified asthma severity, uncomplicated Continue inhalers Go to the ER if any chest pain, shortness of breath, nausea, dizziness, severe HA, changes vision/speech  Chronic bronchitis (HCC) Continue inhalers, follows with pulm  Vertebral compression fracture, sequela Continue fosamax, continue Vit D and Ca, weight bearing exercises  Osteoporosis Continue fosamax, continue Vit D and Ca, weight bearing exercises   Herniated lumbar intervertebral disc Continue pain management  Vitamin D deficiency - Vit D  25 hydroxy (rtn osteoporosis monitoring)  MGUS (monoclonal gammopathy of unknown significance) - CBC with Differential/Platelet  Medication management - Magnesium  Chronic pain syndrome Continue pain management   Anxiety continue medications, stress  management techniques discussed, increase water, good sleep hygiene discussed, increase exercise, and increase veggies.    Gastroesophageal reflux disease, esophagitis presence not specified Continue PPI/H2 blocker, diet discussed   ESOPHAGEAL STRICTURE improved   Constipation, chronic Continue ***    Spondylolisthesis, lumbar region Followed by spine specialist and pain management.   DDD (degenerative disc disease), lumbar Continue follow up pain management  Spinal stenosis, lumbar region, with neurogenic claudication Continue follow up pain management  History of lumbar spinal fusion Continue follow up pain management    Over 30 minutes of exam, counseling, chart review, and critical decision making was performed  Future Appointments  Date Time Provider [place]  06/26/2019  9:15 AM Jessy Oto, MD [facility] None  06/27/2019 10:30 AM [name], NP [facility] None  08/20/2019 10:15 AM [name], MD [facility] None  10/16/2019 10:00 AM [name], MD [facility] None     Plan:   During the course of the visit the patient was educated and counseled about appropriate screening and preventive services including:    Pneumococcal vaccine   Influenza vaccine  Td vaccine  Prevnar 13  Screening electrocardiogram  Screening mammography  Bone densitometry screening  Colorectal cancer screening  Diabetes screening  Glaucoma screening  Nutrition counseling   Advanced directives: given info/requested copies   Subjective:   [name] [name] is a 84 y.o. female who presents for Medicare Annual Wellness Visit and 3 month follow up on hypertension, prediabetes, hyperlipidemia, vitamin D def.   Patient has a chronic pain syndrome and is followed by Dr Nicholaus Bloom (pain management) and Dr Louanne Skye.  Hx of numerous lumbar surgeries, disc and fusions historically, most recently T8, T9 and T11 and T12 vertebral augmentation due to compression  fractures. She frequently presents for pain concerns; currently prescribed *** Recently saw Dr. Havery Moros for abdominal pain, negative workup, suspect r/t chronic msk etiology and advised  to follow up with pain management. Gabapentin She was prescribed linzess for constipation ***  Patient has hx/o Osteoporosis with T-2.6 at last DexaBMD on 11/2018 (improved from -2.8 in 2018) and is on Fosamax, followed by Dr. Estanislado Pandy.   She followes with Dr. Melvyn Novas for COPD/asthma, on BREO  She has history of MGUS with negative BX.   she has a diagnosis of anxiety and is currently on xanax 0.25-0.5 mg TID PRN, reports symptoms are well controlled on current regimen. she reports she last took 2-3 weeks ago, needs sporadically.   BMI is There is no height or weight on file to calculate BMI., she has not been working on diet and exercise. Her weight is up, no edema. She admits to eating more.  Wt Readings from Last 3 Encounters:  06/19/19 170 lb (77.1 kg)  06/10/19 163 lb (73.9 kg)  06/04/19 166 lb (75.3 kg)   She also has history of PTCA/stent placed in 2003 and low risk myoview in 2014.  She has aortic atherosclerosis per numerous CTs, e.g 04/2019.  Her blood pressure has been controlled at home, today their BP is    She does not workout due to chronic pain.   She is on cholesterol medication (pravastatin 40 mg daily) and denies myalgias. Her cholesterol is at goal. The cholesterol last visit was:   Lab Results  Component Value Date   CHOL 152 03/21/2019   HDL 64 03/21/2019   LDLCALC 72 03/21/2019   TRIG 80 03/21/2019   CHOLHDL 2.4 03/21/2019    She has been working on diet and exercise for prediabetes (x 2010), and denies polydipsia, polyuria and visual disturbances. Last A1C in the office was:  Lab Results  Component Value Date   HGBA1C 5.9 (H) 03/21/2019   Last GFR  Lab Results  Component Value Date   GFRNONAA >60 06/10/2019   Patient is on Vitamin D supplement and at goal at recent  check:  Lab Results  Component Value Date   VD25OH 89 03/21/2019      Medication Review Current Outpatient Medications on File Prior to Visit  Medication Sig Dispense Refill  . acetaminophen (TYLENOL) 500 MG tablet Take 1 tablet (500 mg total) by mouth every 6 (six) hours as needed. 30 tablet 0  . ALPRAZolam (XANAX) 0.5 MG tablet Take 1/2 tablet 1- 2 x /day ONLY if needed for Anxiety Attack &  limit to 5 days /week to avoid Addiction - DO NOT take with Hydrocodone or Codeine (Patient taking differently: Take 0.25-0.5 mg by mouth daily as needed for anxiety. Take 1/2 tablet 1- 2 x /day ONLY if needed for Anxiety Attack &  limit to 5 days /week to avoid Addiction - DO NOT take with Hydrocodone or Codeine) 60 tablet 0  . amLODipine (NORVASC) 10 MG tablet Take 1 tablet Daily for BP & Heart 90 tablet 3  . ascorbic acid (VITAMIN C) 500 MG tablet Take 500 mg by mouth 2 (two) times daily.    [name] aspirin EC 81 MG tablet Take 81 mg by mouth at bedtime.     [name] BREO ELLIPTA 100-25 MCG/INH AEPB INHALE 1 PUFF BY MOUTH ONCE DAILY. RINSE MOUTH WITH WATER AFTER EACH USE. 60 each 5  . Cholecalciferol (VITAMIN D3) 5000 units CAPS Take 5,000 Units by mouth 2 (two) times daily.     [name] Coral Calcium 1000 (390 Ca) MG TABS Take 1,000 mg by mouth daily. 60 tablet   . denosumab (PROLIA) 60 MG/ML SOSY injection  Inject 60 mg into the skin every 6 (six) months. 1 mL 0  . diclofenac sodium (VOLTAREN) 1 % GEL Apply 2 g topically 4 (four) times daily. (Patient taking differently: Apply 2 g topically 4 (four) times daily as needed (pain). ) 1 Tube 1  . gabapentin (NEURONTIN) 800 MG tablet Take 1/2 to 1 tablet 3 x /day for Pain (Patient taking differently: Take 400 mg by mouth in the morning, at noon, in the evening, and at bedtime. 830, 1230, 430, 830p) 90 tablet 0  . ipratropium-albuterol (DUONEB) 0.5-2.5 (3) MG/3ML SOLN Take 3 mLs by nebulization every 6 (six) hours as needed (shortness of breath/wheezing).     [name] ketoconazole  (NIZORAL) 2 % cream  APPLY ONE APPLICATION TOPICALLY TWO TIMES DAILY (Patient taking differently: Apply 1 application topically 2 (two) times daily as needed for irritation. ) 30 g 1  . linaclotide (LINZESS) 145 MCG CAPS capsule Take 1 capsule (145 mcg total) by mouth daily before breakfast. If the Linzess 72 mcg is not effective, you can try the 145 mcgs once a day in the morning.  Lot: [id number], Exp: 8-21 8 capsule 0  . linaclotide (LINZESS) 72 MCG capsule Take 1 capsule (72 mcg total) by mouth daily before breakfast. Lot: [id number], Exp: 02-23 8 capsule 0  . Magnesium 400 MG TABS Take 400 mg by mouth 2 (two) times daily.     . meclizine (ANTIVERT) 25 MG tablet Take 1/2 to 1 tablet 2 to 3 x /day ONLY if needed  For Dizziness / Vertigo (Patient taking differently: Take 12.5-25 mg by mouth 3 (three) times daily as needed for dizziness. Take 1/2 to 1 tablet 2 to 3 x /day ONLY if needed  For Dizziness / Vertigo) 270 tablet 1  . metoprolol tartrate (LOPRESSOR) 50 MG tablet Take 1 tablet 2 x /day for BP & Heart (Patient taking differently: Take 50 mg by mouth 2 (two) times daily. Take 1 tablet 2 x /day for BP & Heart) 180 tablet 3  . mirabegron ER (MYRBETRIQ) 25 MG TB24 tablet Take 25 mg by mouth daily.    . Multiple Vitamins-Minerals (MULTIVITAMIN WITH MINERALS) tablet Take 1 tablet by mouth daily.    . nitroGLYCERIN (NITROSTAT) 0.4 MG SL tablet Place 1 tablet (0.4 mg total) under the tongue every 5 (five) minutes as needed for chest pain. 90 tablet 3  . omeprazole (PRILOSEC) 40 MG capsule Take 1 capsule 2 x /day for Indigestion , Heartburn & Nausea (Patient taking differently: Take 40 mg by mouth 2 (two) times daily. Take 1 capsule 2 x /day for Indigestion , Heartburn & Nausea) 180 capsule 3  . polyethylene glycol powder (GLYCOLAX/MIRALAX) powder Take 17 g by mouth 2 (two) times daily. 527 g 0  . pravastatin (PRAVACHOL) 40 MG tablet Take 40 mg by mouth daily.    . promethazine (PHENERGAN) 25 MG tablet Take 1  tablet every 4 hours if needed for Nausea or Vomitting 50 tablet 0  . senna-docusate (SENOKOT-S) 8.6-50 MG tablet Take 2 tablets by mouth 2 (two) times daily. 60 tablet 1  . sucralfate (CARAFATE) 1 g tablet Take 1 tablet 2 to 4 x /day before meals for Heartburn 360 tablet 1  . vitamin B-12 (CYANOCOBALAMIN) 1000 MCG tablet Take 1,000 mcg by mouth daily.    [name] zinc gluconate 50 MG tablet Take 50 mg by mouth daily.     Current Facility-Administered Medications on File Prior to Visit  Medication Dose Route Frequency Provider Last Rate  Last Admin  . calcitonin (salmon) (MIACALCIN/FORTICAL) nasal spray 1 spray  1 spray Alternating Nares Daily Jessy Oto, MD        Current Problems (verified) Patient Active Problem List   Diagnosis Date Noted  . Nausea & vomiting 05/13/2018  . Abnormal glucose 03/01/2018  . FHx: heart disease 03/01/2018  . Therapeutic opioid-induced constipation (OIC) 12/21/2017  . Abnormal UGI series 12/21/2017  . CKD (chronic kidney disease) stage 2, GFR 60-89 ml/min 09/27/2017  . T12 compression fracture ([place]) 09/26/2017  . Chronic bronchitis ([place]) 08/09/2017  . Atherosclerosis of aorta ([place]) 04/05/2017  . Anxiety state 06/08/2016  . Spinal stenosis, lumbar region, with neurogenic claudication 05/29/2015  . DDD (degenerative disc disease), lumbar 04/21/2015  . Hyperlipidemia, mixed 11/24/2014  . Gastroesophageal reflux disease 11/24/2014  . Depression, major, in partial remission ([place]) 09/26/2014  . Chronic pain syndrome 09/26/2014  . Coronary artery disease due to lipid rich plaque   . Osteoporosis 02/10/2014  . Prediabetes 08/14/2013  . Vitamin D deficiency 08/14/2013  . MGUS (monoclonal gammopathy of unknown significance) 03/05/2013  . Vertebral compression fracture ([place]) 06/23/2012  . ESOPHAGEAL STRICTURE 08/28/2008  . Mild intermittent asthma without complication [id number]  . Incarcerated recurrent paraesophageal hiatal hernia s/p lap redo repair [id number]  01/31/2008  . Essential hypertension 07/25/2007    Screening Tests Immunization History  Administered Date(s) Administered  . DT (Pediatric) 05/06/2014  . Influenza Split 02/07/2011  . Influenza Whole 02/09/2009, 01/19/2010, 02/13/2012  . Influenza, High Dose Seasonal PF 03/22/2013, 01/15/2014, 02/27/2015, 01/14/2016, 01/18/2017, 02/14/2018, 02/14/2019  . PFIZER SARS-COV-2 Vaccination 05/25/2019, 06/15/2019  . Pneumococcal Conjugate-13 05/06/2014  . Pneumococcal Polysaccharide-23 02/09/2011  . Pneumococcal-Unspecified 02/06/2010, 02/09/2012  . Td 05/10/2003    Preventative care: Last colonoscopy: 05/2017 EGD 12/2015 Last mammogram: 11/2016 DUE *** Last pap smear/pelvic exam: remote,declines   DEXA 11/2018 L fem T -2.6, continue alendronate per Dr. Estanislado Pandy  CT head 01/2014 Ct AB 11/2013 CT chest 11/2013 Myocardial perfusion 09/2014, previous MI, low risk study Echo 08/2014, EF 55-60%  Prior vaccinations: TD or Tdap: 04/2014  Influenza: 02/2019  Pneumococcal: 2012 Prevnar13:  2015 Shingles/Zostavax: declines due to cost Covid 19: 2/2, 2021  Names of Other Physician/Practitioners you currently use: 1. [facility]- here for primary care 2. Dr. Katy Fitch eye doctor, last visit 2019 3. No dentist, dentures.    Patient Care Team: [name], MD as PCP - General (Internal Medicine) Jerline Pain, MD as PCP - Cardiology (Cardiology) Inda Castle, MD (Inactive) as Consulting Physician (Gastroenterology) Rigoberto Noel, MD as Consulting Physician (Pulmonary Disease) Larey Dresser, MD as Consulting Physician (Cardiology) Michael Boston, MD as Consulting Physician (General Surgery) Jessy Oto, MD as Consulting Physician (Orthopedic Surgery) Philemon Kingdom, MD as Consulting Physician (Endocrinology) Curt Bears, MD as Consulting Physician (Oncology)  Allergies Allergies  Allergen Reactions  . Ace Inhibitors Cough     Per Dr Melvyn Novas pulmonology 2013  . Aspartame Diarrhea and Nausea And Vomiting  . Atorvastatin Other (See Comments)    Muscle aches  . Biaxin [Clarithromycin] Other (See Comments)    Unknown reaction : PATIENT CAN TOLERATE Z-PAK.  Is written on patient's paper chart.  [name] [Roflumilast] Nausea And Vomiting  . Erythromycin Other (See Comments)    Unknown reaction: PATIENT CAN TOLERATE Z-PAK.  It is written on patient's paper chart.  . Levofloxacin Nausea And Vomiting    SURGICAL HISTORY She  has a past surgical history that includes Back surgery; Bladder  repair; Hemorrhoid surgery; Cholecystectomy; Appendectomy; Total abdominal hysterectomy (1975); Eye surgery (11/2000); Hiatal hernia repair (06/03/2011); Laparoscopic Nissen fundoplication ([id number]); Lumbar laminectomy/decompression microdiscectomy (Right, 06/26/2012); Esophagogastroduodenoscopy (N/A, 03/15/2013); Esophageal manometry (N/A, 03/25/2013); Hiatal hernia repair (N/A, 04/24/2013); Insertion of mesh (N/A, 04/24/2013); Laparoscopic lysis of adhesions (N/A, 04/24/2013); Fixation kyphoplasty lumbar spine (X 2); Coronary angioplasty (11/16/2001); Lumbar laminectomy/decompression microdiscectomy (N/A, 05/29/2015); Lumbar fusion (12/07/2015); Esophagogastroduodenoscopy (N/A, 12/25/2015); IR KYPHO THORACIC WITH BONE BIOPSY (09/29/2017); IR KYPHO THORACIC WITH BONE BIOPSY (11/03/2017); Upper gastrointestinal endoscopy; IR KYPHO THORACIC WITH BONE BIOPSY (02/14/2019); and IR KYPHO THORACIC WITH BONE BIOPSY (04/02/2019). FAMILY HISTORY Her family history includes Brain cancer in her brother; Colon cancer (age of onset: 51) in her mother; Heart attack (age of onset: 57) in her father; Heart disease in her brother and brother; Kidney disease in her daughter. SOCIAL HISTORY She  reports that she has never smoked. She has never used smokeless tobacco. She reports that she does not drink alcohol or use drugs.  MEDICARE WELLNESS OBJECTIVES: Physical  activity:   Cardiac risk factors:   Depression/mood screen:   Depression screen [facility] 2/9 03/24/2019  Decreased Interest 0  Down, Depressed, Hopeless 0  PHQ - 2 Score 0  Some recent data might be hidden    ADLs:  In your present state of health, do you have any difficulty performing the following activities: 04/02/2019 03/24/2019  Hearing? N N  Vision? N N  Difficulty concentrating or making decisions? N N  Walking or climbing stairs? N N  Dressing or bathing? N N  Doing errands, shopping? - N  Some recent data might be hidden     Cognitive Testing  Alert? Yes  Normal Appearance?Yes  Oriented to person? Yes  Place? Yes   Time? Yes  Recall of three objects?  Yes  Can perform simple calculations? Yes  Displays appropriate judgment?Yes  Can read the correct time from a watch face?Yes  EOL planning:     Objective:   There were no vitals filed for this visit. There is no height or weight on file to calculate BMI.  General appearance: alert, no distress, WD/WN,  female HEENT: normocephalic, sclerae anicteric, TMs pearly, nares patent, no discharge or erythema, pharynx normal Oral cavity: MMM, no lesions Neck: supple, no lymphadenopathy, no thyromegaly, no masses Heart: RRR, normal S1, S2, no murmurs Lungs: CTA bilaterally, no wheezes, rhonchi, or rales Abdomen: +bs, soft, non tender, non distended, no masses, no hepatomegaly, no splenomegaly Musculoskeletal: nontender, no swelling, no obvious deformity Extremities: no edema, no cyanosis, no clubbing Pulses: 2+ symmetric, upper and lower extremities, normal cap refill Neurological: alert, oriented x 3, CN2-12 intact, strength 4/5 upper extremities and lower extremities, DTRs 2+ throughout, no cerebellar signs, gait antaglic with cane Psychiatric: normal affect, behavior normal, pleasant  Breast: defer Gyn: defer Rectal: defer   Medicare Attestation I have personally reviewed: The patient's medical and social  history Their use of alcohol, tobacco or illicit drugs Their current medications and supplements The patient's functional ability including ADLs,fall risks, home safety risks, cognitive, and hearing and visual impairment Diet and physical activities Evidence for depression or mood disorders  The patient's weight, height, BMI, and visual acuity have been recorded in the chart.  I have made referrals, counseling, and provided education to the patient based on review of the above and I have provided the patient with a written personalized care plan for preventive services.     Izora Ribas, NP   06/26/2019

## 2019-06-27 ENCOUNTER — Ambulatory Visit: Payer: PPO | Admitting: Adult Health

## 2019-06-27 NOTE — Progress Notes (Signed)
MEDICARE ANNUAL WELLNESS VISIT AND FOLLOW UP  Assessment:   Medicare annual wellness visit 1 year  Essential hypertension - continue medications, DASH diet, exercise and monitor at home. Call if greater than 130/80.  - CBC with Differential/Platelet - CMP/GFR - TSH  Prediabetes Discussed general issues about diabetes pathophysiology and management., Educational material distributed., Suggested low cholesterol diet., Encouraged aerobic exercise., Discussed foot care., Reminded to get yearly retinal exam. - Hemoglobin A1c q42m   Mixed hyperlipidemia -continue medications, check lipids, decrease fatty foods, increase activity.  - Lipid panel  Coronary artery disease due to lipid rich plaque Control blood pressure, cholesterol, glucose, increase exercise.  - Lipid panel - Hemoglobin A1c  Recurrent major depressive disorder, in partial remission (HCC) Partial remission, discussed may benefit with chronic pain with restarting cymbalta Send in 30 mg caps to start once daily x4 weeks, then increase to 60 mg if tolerating Follow up in 8-12 weeks or sooner if needed  Atherosclerosis of aorta (HCC) Per numerous CTs Control blood pressure, cholesterol, glucose, increase exercise.   Incarcerated recurrent paraesophageal hiatal hernia s/p lap redo repair [phone] Improved, continue PPI   Asthma, unspecified asthma severity, uncomplicated Continue inhalers Go to the ER if any chest pain, shortness of breath, nausea, dizziness, severe HA, changes vision/speech  Chronic bronchitis (HCC) Continue inhalers, follows with pulm  Vertebral compression fracture, sequela Continue fosamax, continue Vit D and Ca, weight bearing exercises  Osteoporosis Continue fosamax, continue Vit D and Ca, weight bearing exercises   Herniated lumbar intervertebral disc Continue pain management  Vitamin D deficiency - Vit D  25 hydroxy (rtn osteoporosis monitoring)  MGUS (monoclonal gammopathy of unknown  significance) - CBC with Differential/Platelet  Medication management - Magnesium  Chronic pain syndrome Continue pain management   Anxiety continue medications, stress management techniques discussed, increase water, good sleep hygiene discussed, increase exercise, and increase veggies.    Gastroesophageal reflux disease, esophagitis presence not specified Continue PPI/H2 blocker, diet discussed   ESOPHAGEAL STRICTURE Improved on last, follow up GI as recommended; continue PPI   Constipation, chronic Continue, on linzess via GI, follow up as scheduled  Spondylolisthesis, lumbar region Followed by spine specialist and pain management.   DDD (degenerative disc disease), lumbar Continue follow up pain management  Spinal stenosis, lumbar region, with neurogenic claudication Continue follow up pain management  History of lumbar spinal fusion Continue follow up pain management    Over 30 minutes of exam, counseling, chart review, and critical decision making was performed  Future Appointments  Date Time Provider [place]  07/09/2019 10:00 AM Zehr, Laban Emperor, PA-C [facility]  08/20/2019 10:15 AM [name], MD [facility] None  10/16/2019 10:00 AM [name], MD [facility] None     Plan:   During the course of the visit the patient was educated and counseled about appropriate screening and preventive services including:    Pneumococcal vaccine   Influenza vaccine  Td vaccine  Prevnar 13  Screening electrocardiogram  Screening mammography  Bone densitometry screening  Colorectal cancer screening  Diabetes screening  Glaucoma screening  Nutrition counseling   Advanced directives: given info/requested copies   Subjective:   [name] [name] is a 84 y.o. female who presents for Medicare Annual Wellness Visit and 3 month follow up on hypertension, prediabetes, hyperlipidemia, vitamin D def.   Patient has a chronic pain syndrome and  is followed by Dr Nicholaus Bloom (pain management) and Dr Louanne Skye.  Hx of numerous lumbar surgeries, disc and fusions historically, most  recently T8, T9 and T11 and T12 vertebral augmentation due to compression fractures. She frequently presents for pain concerns; currently prescribed  Recently saw Dr. Havery Moros for abdominal pain, negative workup, suspect r/t chronic msk etiology and advised to follow up with pain management. She was prescribed linzess for constipation, having good BMs but is bloating and plans to follow up with GI, appointment on Monday next week. Apparently per patient has discussed EGD and plans to pursue.   Gabapentin taking 400 mg 4 times a day. Has been prescribed hydrocodone-acetaminophen via pain management but patient states hasn't been taking per strong patient preference.   Patient has hx/o Osteoporosis with T-2.6 at last DexaBMD on 11/2018 (improved from -2.8 in 2018) and is on Fosamax, followed by Dr. Estanislado Pandy.   She followes with Dr. Melvyn Novas for COPD/asthma, on BREO  She has history of MGUS with negative BX.   she has a diagnosis of anxiety and is currently on xanax 0.25-0.5 mg TID PRN, reports symptoms are well controlled on current regimen. she reports she last took 2-3 weeks ago, needs sporadically.   BMI is Body mass index is 31.42 kg/m., she has not been working on diet and exercise. Her weight is up, no edema. She admits to eating more.  Wt Readings from Last 3 Encounters:  07/05/19 169 lb (76.7 kg)  06/19/19 170 lb (77.1 kg)  06/10/19 163 lb (73.9 kg)   She also has history of PTCA/stent placed in 2003 and low risk myoview in 2014.  She has aortic atherosclerosis per numerous CTs, e.g 04/2019.  Her blood pressure has been controlled at home, today their BP is BP: 130/72  She does not workout due to chronic pain.   She is on cholesterol medication (pravastatin 40 mg daily) and denies myalgias. Her cholesterol is at goal. The cholesterol last visit was:   Lab  Results  Component Value Date   CHOL 152 03/21/2019   HDL 64 03/21/2019   LDLCALC 72 03/21/2019   TRIG 80 03/21/2019   CHOLHDL 2.4 03/21/2019    She has been working on diet and exercise for prediabetes (x 2010), and denies polydipsia, polyuria and visual disturbances. Last A1C in the office was:  Lab Results  Component Value Date   HGBA1C 5.9 (H) 03/21/2019   Last GFR  Lab Results  Component Value Date   GFRNONAA >60 06/10/2019   Patient is on Vitamin D supplement and at goal at recent check:  Lab Results  Component Value Date   VD25OH 89 03/21/2019      Medication Review Current Outpatient Medications on File Prior to Visit  Medication Sig Dispense Refill  . acetaminophen (TYLENOL) 500 MG tablet Take 1 tablet (500 mg total) by mouth every 6 (six) hours as needed. 30 tablet 0  . ALPRAZolam (XANAX) 0.5 MG tablet Take 1/2 tablet 1- 2 x /day ONLY if needed for Anxiety Attack &  limit to 5 days /week to avoid Addiction - DO NOT take with Hydrocodone or Codeine (Patient taking differently: Take 0.25-0.5 mg by mouth daily as needed for anxiety. Take 1/2 tablet 1- 2 x /day ONLY if needed for Anxiety Attack &  limit to 5 days /week to avoid Addiction - DO NOT take with Hydrocodone or Codeine) 60 tablet 0  . amLODipine (NORVASC) 10 MG tablet Take 1 tablet Daily for BP & Heart 90 tablet 3  . ascorbic acid (VITAMIN C) 500 MG tablet Take 500 mg by mouth 2 (two) times daily.    [name]  aspirin EC 81 MG tablet Take 81 mg by mouth at bedtime.     [name] BREO ELLIPTA 100-25 MCG/INH AEPB INHALE 1 PUFF BY MOUTH ONCE DAILY. RINSE MOUTH WITH WATER AFTER EACH USE. 60 each 5  . Cholecalciferol (VITAMIN D3) 5000 units CAPS Take 5,000 Units by mouth 2 (two) times daily.     [name] Coral Calcium 1000 (390 Ca) MG TABS Take 1,000 mg by mouth daily. 60 tablet   . denosumab (PROLIA) 60 MG/ML SOSY injection Inject 60 mg into the skin every 6 (six) months. 1 mL 0  . diclofenac sodium (VOLTAREN) 1 % GEL Apply 2 g topically 4  (four) times daily. (Patient taking differently: Apply 2 g topically 4 (four) times daily as needed (pain). ) 1 Tube 1  . gabapentin (NEURONTIN) 800 MG tablet Take 1/2 to 1 tablet 3 x /day for Pain (Patient taking differently: Take 400 mg by mouth in the morning, at noon, in the evening, and at bedtime. 830, 1230, 430, 830p) 90 tablet 0  . ipratropium-albuterol (DUONEB) 0.5-2.5 (3) MG/3ML SOLN Take 3 mLs by nebulization every 6 (six) hours as needed (shortness of breath/wheezing).     [name] ketoconazole (NIZORAL) 2 % cream  APPLY ONE APPLICATION TOPICALLY TWO TIMES DAILY (Patient taking differently: Apply 1 application topically 2 (two) times daily as needed for irritation. ) 30 g 1  . linaclotide (LINZESS) 145 MCG CAPS capsule Take 1 capsule (145 mcg total) by mouth daily before breakfast. 30 capsule 3  . Magnesium 400 MG TABS Take 400 mg by mouth 2 (two) times daily.     . meclizine (ANTIVERT) 25 MG tablet Take 1/2 to 1 tablet 2 to 3 x /day ONLY if needed  For Dizziness / Vertigo (Patient taking differently: Take 12.5-25 mg by mouth 3 (three) times daily as needed for dizziness. Take 1/2 to 1 tablet 2 to 3 x /day ONLY if needed  For Dizziness / Vertigo) 270 tablet 1  . metoprolol tartrate (LOPRESSOR) 50 MG tablet Take 1 tablet 2 x /day for BP & Heart (Patient taking differently: Take 50 mg by mouth 2 (two) times daily. Take 1 tablet 2 x /day for BP & Heart) 180 tablet 3  . mirabegron ER (MYRBETRIQ) 25 MG TB24 tablet Take 25 mg by mouth daily.    . Multiple Vitamins-Minerals (MULTIVITAMIN WITH MINERALS) tablet Take 1 tablet by mouth daily.    . nitroGLYCERIN (NITROSTAT) 0.4 MG SL tablet Place 1 tablet (0.4 mg total) under the tongue every 5 (five) minutes as needed for chest pain. 90 tablet 3  . omeprazole (PRILOSEC) 40 MG capsule Take 1 capsule 2 x /day for Indigestion , Heartburn & Nausea (Patient taking differently: Take 40 mg by mouth 2 (two) times daily. Take 1 capsule 2 x /day for Indigestion ,  Heartburn & Nausea) 180 capsule 3  . polyethylene glycol powder (GLYCOLAX/MIRALAX) powder Take 17 g by mouth 2 (two) times daily. 527 g 0  . promethazine (PHENERGAN) 25 MG tablet Take 1 tablet every 4 hours if needed for Nausea or Vomitting 50 tablet 0  . senna-docusate (SENOKOT-S) 8.6-50 MG tablet Take 2 tablets by mouth 2 (two) times daily. 60 tablet 1  . sucralfate (CARAFATE) 1 g tablet Take 1 tablet 2 to 4 x /day before meals for Heartburn 360 tablet 1  . vitamin B-12 (CYANOCOBALAMIN) 1000 MCG tablet Take 1,000 mcg by mouth daily.    [name] zinc gluconate 50 MG tablet Take 50 mg by mouth daily.    [name]  pravastatin (PRAVACHOL) 40 MG tablet Take 40 mg by mouth daily.     Current Facility-Administered Medications on File Prior to Visit  Medication Dose Route Frequency Provider Last Rate Last Admin  . calcitonin (salmon) (MIACALCIN/FORTICAL) nasal spray 1 spray  1 spray Alternating Nares Daily Jessy Oto, MD        Current Problems (verified) Patient Active Problem List   Diagnosis Date Noted  . Senile purpura ([place]) 07/05/2019  . Atherosclerosis of autologous vein coronary artery bypass graft with angina pectoris ([place]) 07/05/2019  . Nausea & vomiting 05/13/2018  . Abnormal glucose 03/01/2018  . FHx: heart disease 03/01/2018  . Therapeutic opioid-induced constipation (OIC) 12/21/2017  . Abnormal UGI series 12/21/2017  . CKD (chronic kidney disease) stage 2, GFR 60-89 ml/min 09/27/2017  . T12 compression fracture ([place]) 09/26/2017  . Chronic bronchitis ([place]) 08/09/2017  . Atherosclerosis of aorta ([place]) 04/05/2017  . Anxiety state 06/08/2016  . Spinal stenosis, lumbar region, with neurogenic claudication 05/29/2015  . DDD (degenerative disc disease), lumbar 04/21/2015  . Hyperlipidemia, mixed 11/24/2014  . Gastroesophageal reflux disease 11/24/2014  . Depression, major, in partial remission ([place]) 09/26/2014  . Chronic pain syndrome 09/26/2014  . Coronary artery disease due to lipid rich  plaque   . Osteoporosis 02/10/2014  . Prediabetes 08/14/2013  . Vitamin D deficiency 08/14/2013  . MGUS (monoclonal gammopathy of unknown significance) 03/05/2013  . Vertebral compression fracture ([place]) 06/23/2012  . ESOPHAGEAL STRICTURE 08/28/2008  . Mild intermittent asthma without complication [id number]  . Incarcerated recurrent paraesophageal hiatal hernia s/p lap redo repair [id number] 01/31/2008  . Essential hypertension 07/25/2007    Screening Tests Immunization History  Administered Date(s) Administered  . DT (Pediatric) 05/06/2014  . Influenza Split 02/07/2011  . Influenza Whole 02/09/2009, 01/19/2010, 02/13/2012  . Influenza, High Dose Seasonal PF 03/22/2013, 01/15/2014, 02/27/2015, 01/14/2016, 01/18/2017, 02/14/2018, 02/14/2019  . PFIZER SARS-COV-2 Vaccination 05/25/2019, 06/15/2019  . Pneumococcal Conjugate-13 05/06/2014  . Pneumococcal Polysaccharide-23 02/09/2011  . Pneumococcal-Unspecified 02/06/2010, 02/09/2012  . Td 05/10/2003    Preventative care: Last colonoscopy: 05/2017 EGD 12/2015 Last mammogram: 11/2016 DUE  Last pap smear/pelvic exam: remote,declines   DEXA 11/2018 L fem T -2.6, continue prolia per Dr. Estanislado Pandy  CT head 01/2014 Ct AB 11/2013 CT chest 11/2013 Myocardial perfusion 09/2014, previous MI, low risk study Echo 08/2014, EF 55-60%  Prior vaccinations: TD or Tdap: 04/2014  Influenza: 02/2019  Pneumococcal: 2012 Prevnar13:  2015 Shingles/Zostavax: declines due to cost Covid 19: 2/2, 2021  Names of Other Physician/Practitioners you currently use: 1.  [facility]- here for primary care 2. Dr. Einar Gip, eye doctor, last visit 2019, patient states will schedule 3. No dentist, dentures.    Patient Care Team: [name], MD as PCP - General (Internal Medicine) Jerline Pain, MD as PCP - Cardiology (Cardiology) Inda Castle, MD (Inactive) as Consulting Physician (Gastroenterology) Rigoberto Noel, MD as Consulting Physician (Pulmonary Disease) Larey Dresser, MD as Consulting Physician (Cardiology) Michael Boston, MD as Consulting Physician (General Surgery) Jessy Oto, MD as Consulting Physician (Orthopedic Surgery) Philemon Kingdom, MD as Consulting Physician (Endocrinology) Curt Bears, MD as Consulting Physician (Oncology)  Allergies Allergies  Allergen Reactions  . Ace Inhibitors Cough    Per Dr Melvyn Novas pulmonology 2013  . Aspartame Diarrhea and Nausea And Vomiting  . Atorvastatin Other (See Comments)    Muscle aches  . Biaxin [Clarithromycin] Other (See Comments)    Unknown reaction : PATIENT CAN TOLERATE Z-PAK.  Is written on  patient's paper chart.  [name] [Roflumilast] Nausea And Vomiting  . Erythromycin Other (See Comments)    Unknown reaction: PATIENT CAN TOLERATE Z-PAK.  It is written on patient's paper chart.  . Levofloxacin Nausea And Vomiting    SURGICAL HISTORY She  has a past surgical history that includes Back surgery; Bladder repair; Hemorrhoid surgery; Cholecystectomy; Appendectomy; Total abdominal hysterectomy (1975); Eye surgery (11/2000); Hiatal hernia repair (06/03/2011); Laparoscopic Nissen fundoplication ([id number]); Lumbar laminectomy/decompression microdiscectomy (Right, 06/26/2012); Esophagogastroduodenoscopy (N/A, 03/15/2013); Esophageal manometry (N/A, 03/25/2013); Hiatal hernia repair (N/A, 04/24/2013); Insertion of mesh (N/A, 04/24/2013); Laparoscopic lysis of adhesions (N/A, 04/24/2013); Fixation kyphoplasty lumbar spine (X 2); Coronary angioplasty (11/16/2001); Lumbar laminectomy/decompression microdiscectomy (N/A, 05/29/2015); Lumbar fusion (12/07/2015); Esophagogastroduodenoscopy (N/A, 12/25/2015); IR KYPHO THORACIC WITH BONE BIOPSY (09/29/2017); IR KYPHO THORACIC WITH BONE BIOPSY (11/03/2017); Upper gastrointestinal endoscopy; IR KYPHO THORACIC WITH BONE BIOPSY (02/14/2019); and IR KYPHO THORACIC WITH BONE BIOPSY (04/02/2019). FAMILY  HISTORY Her family history includes Brain cancer in her brother; Colon cancer (age of onset: 53) in her mother; Heart attack (age of onset: 65) in her father; Heart disease in her brother and brother; Kidney disease in her daughter. SOCIAL HISTORY She  reports that she has never smoked. She has never used smokeless tobacco. She reports that she does not drink alcohol or use drugs.  MEDICARE WELLNESS OBJECTIVES: Physical activity: Current Exercise Habits: The patient does not participate in regular exercise at present, Exercise limited by: orthopedic condition(s) Cardiac risk factors: Cardiac Risk Factors include: advanced age (>92men, >31 women);dyslipidemia;hypertension;sedentary lifestyle;obesity (BMI >30kg/m2) Depression/mood screen:   Depression screen [facility] 2/9 07/05/2019  Decreased Interest 1  Down, Depressed, Hopeless 1  PHQ - 2 Score 2  Altered sleeping 2  Tired, decreased energy 1  Change in appetite 0  Feeling bad or failure about yourself  0  Trouble concentrating 0  Moving slowly or fidgety/restless 0  Suicidal thoughts 0  PHQ-9 Score 5  Difficult doing work/chores Somewhat difficult  Some recent data might be hidden    ADLs:  In your present state of health, do you have any difficulty performing the following activities: 07/05/2019 04/02/2019  Hearing? N N  Vision? N N  Difficulty concentrating or making decisions? N N  Walking or climbing stairs? N N  Dressing or bathing? N N  Doing errands, shopping? N -  Some recent data might be hidden     Cognitive Testing  Alert? Yes  Normal Appearance?Yes  Oriented to person? Yes  Place? Yes   Time? Yes  Recall of three objects?  Yes  Can perform simple calculations? Yes  Displays appropriate judgment?Yes  Can read the correct time from a watch face?Yes  EOL planning: Does Patient Have a Medical Advance Directive?: Yes Type of Advance Directive: Healthcare Power of Attorney, Living will Does patient want to make changes  to medical advance directive?: No - Patient declined Copy of [place] in Chart?: No - copy requested   Objective:   Today's Vitals   07/05/19 1038  BP: 130/72  Pulse: 75  Temp: (!) 97.3 F (36.3 C)  SpO2: 96%  Weight: 169 lb (76.7 kg)   Body mass index is 31.42 kg/m.  General appearance: alert, no distress, WD/WN,  female HEENT: normocephalic, sclerae anicteric, TMs pearly, nares patent, no discharge or erythema, pharynx normal Oral cavity: MMM, no lesions Neck: supple, no lymphadenopathy, no thyromegaly, no masses Heart: RRR, normal S1, S2, no murmurs Lungs: CTA bilaterally, no wheezes, rhonchi, or rales Abdomen: +bs, rounded,  non tender, no palpable masses, no hepatomegaly, no splenomegaly Musculoskeletal: nontender, no swelling, no obvious deformity Extremities: no edema, no cyanosis, no clubbing Pulses: 2+ symmetric, upper and lower extremities, normal cap refill Neurological: alert, oriented x 3, CN2-12 intact, strength 4/5 upper extremities and lower extremities, DTRs 2+ throughout, no cerebellar signs, gait antaglic with cane Psychiatric: normal affect, behavior normal, pleasant  Breast: defer Gyn: defer Rectal: defer   Medicare Attestation I have personally reviewed: The patient's medical and social history Their use of alcohol, tobacco or illicit drugs Their current medications and supplements The patient's functional ability including ADLs,fall risks, home safety risks, cognitive, and hearing and visual impairment Diet and physical activities Evidence for depression or mood disorders  The patient's weight, height, BMI, and visual acuity have been recorded in the chart.  I have made referrals, counseling, and provided education to the patient based on review of the above and I have provided the patient with a written personalized care plan for preventive services.     Izora Ribas, NP   07/05/2019

## 2019-06-28 ENCOUNTER — Telehealth: Payer: Self-pay | Admitting: Gastroenterology

## 2019-06-28 ENCOUNTER — Telehealth: Payer: Self-pay

## 2019-06-28 NOTE — Telephone Encounter (Signed)
Left message for patient to please call back. 

## 2019-07-02 ENCOUNTER — Other Ambulatory Visit: Payer: Self-pay

## 2019-07-02 ENCOUNTER — Telehealth: Payer: Self-pay | Admitting: Gastroenterology

## 2019-07-02 MED ORDER — LINACLOTIDE 145 MCG PO CAPS: 145.0000 ug | ORAL_CAPSULE | Freq: Every day | ORAL | 3 refills | Status: DC

## 2019-07-02 NOTE — Telephone Encounter (Signed)
Pt is requesting prescription for Linzess 145 mcg sent to [facility] on [address].

## 2019-07-02 NOTE — Telephone Encounter (Signed)
Refill of Linzess 132mcg once daily sent to pharmacy

## 2019-07-02 NOTE — Telephone Encounter (Signed)
Patient requesting refill of Linzess. Sent to patient's pharmacy

## 2019-07-05 ENCOUNTER — Other Ambulatory Visit: Payer: Self-pay

## 2019-07-05 ENCOUNTER — Ambulatory Visit (INDEPENDENT_AMBULATORY_CARE_PROVIDER_SITE_OTHER): Payer: PPO | Admitting: Adult Health

## 2019-07-05 ENCOUNTER — Encounter: Payer: Self-pay | Admitting: Adult Health

## 2019-07-05 VITALS — BP 130/72 | HR 75 | Temp 97.3°F | Wt 169.0 lb

## 2019-07-05 DIAGNOSIS — E559 Vitamin D deficiency, unspecified: Secondary | ICD-10-CM

## 2019-07-05 DIAGNOSIS — J4520 Mild intermittent asthma, uncomplicated: Secondary | ICD-10-CM | POA: Diagnosis not present

## 2019-07-05 DIAGNOSIS — J42 Unspecified chronic bronchitis: Secondary | ICD-10-CM | POA: Diagnosis not present

## 2019-07-05 DIAGNOSIS — Z0001 Encounter for general adult medical examination with abnormal findings: Secondary | ICD-10-CM | POA: Diagnosis not present

## 2019-07-05 DIAGNOSIS — K440 Diaphragmatic hernia with obstruction, without gangrene: Secondary | ICD-10-CM

## 2019-07-05 DIAGNOSIS — S22080S Wedge compression fracture of T11-T12 vertebra, sequela: Secondary | ICD-10-CM

## 2019-07-05 DIAGNOSIS — I10 Essential (primary) hypertension: Secondary | ICD-10-CM

## 2019-07-05 DIAGNOSIS — D692 Other nonthrombocytopenic purpura: Secondary | ICD-10-CM

## 2019-07-05 DIAGNOSIS — I25719 Atherosclerosis of autologous vein coronary artery bypass graft(s) with unspecified angina pectoris: Secondary | ICD-10-CM

## 2019-07-05 DIAGNOSIS — I700 Atherosclerosis of aorta: Secondary | ICD-10-CM

## 2019-07-05 DIAGNOSIS — M51369 Other intervertebral disc degeneration, lumbar region without mention of lumbar back pain or lower extremity pain: Secondary | ICD-10-CM

## 2019-07-05 DIAGNOSIS — S22060G Wedge compression fracture of T7-T8 vertebra, subsequent encounter for fracture with delayed healing: Secondary | ICD-10-CM

## 2019-07-05 DIAGNOSIS — K219 Gastro-esophageal reflux disease without esophagitis: Secondary | ICD-10-CM

## 2019-07-05 DIAGNOSIS — D472 Monoclonal gammopathy: Secondary | ICD-10-CM

## 2019-07-05 DIAGNOSIS — F3341 Major depressive disorder, recurrent, in partial remission: Secondary | ICD-10-CM | POA: Diagnosis not present

## 2019-07-05 DIAGNOSIS — M810 Age-related osteoporosis without current pathological fracture: Secondary | ICD-10-CM | POA: Diagnosis not present

## 2019-07-05 DIAGNOSIS — R6889 Other general symptoms and signs: Secondary | ICD-10-CM

## 2019-07-05 DIAGNOSIS — M48062 Spinal stenosis, lumbar region with neurogenic claudication: Secondary | ICD-10-CM

## 2019-07-05 DIAGNOSIS — E782 Mixed hyperlipidemia: Secondary | ICD-10-CM

## 2019-07-05 DIAGNOSIS — K5903 Drug induced constipation: Secondary | ICD-10-CM

## 2019-07-05 DIAGNOSIS — I2583 Coronary atherosclerosis due to lipid rich plaque: Secondary | ICD-10-CM | POA: Diagnosis not present

## 2019-07-05 DIAGNOSIS — I2510 Atherosclerotic heart disease of native coronary artery without angina pectoris: Secondary | ICD-10-CM

## 2019-07-05 DIAGNOSIS — N182 Chronic kidney disease, stage 2 (mild): Secondary | ICD-10-CM

## 2019-07-05 DIAGNOSIS — M5136 Other intervertebral disc degeneration, lumbar region: Secondary | ICD-10-CM | POA: Diagnosis not present

## 2019-07-05 DIAGNOSIS — T402X5A Adverse effect of other opioids, initial encounter: Secondary | ICD-10-CM

## 2019-07-05 DIAGNOSIS — R7309 Other abnormal glucose: Secondary | ICD-10-CM | POA: Diagnosis not present

## 2019-07-05 DIAGNOSIS — R7303 Prediabetes: Secondary | ICD-10-CM

## 2019-07-05 DIAGNOSIS — G894 Chronic pain syndrome: Secondary | ICD-10-CM

## 2019-07-05 DIAGNOSIS — Z0000 Encounter for general adult medical examination without abnormal findings: Secondary | ICD-10-CM

## 2019-07-05 LAB — TSH: TSH: 0.81 mIU/L (ref 0.40–4.50)

## 2019-07-05 LAB — COMPLETE METABOLIC PANEL WITH GFR
AG Ratio: 1.8 (calc) (ref 1.0–2.5)
ALT: 12 U/L (ref 6–29)
AST: 13 U/L (ref 10–35)
Albumin: 4.3 g/dL (ref 3.6–5.1)
Alkaline phosphatase (APISO): 63 U/L (ref 37–153)
BUN: 17 mg/dL (ref 7–25)
CO2: 26 mmol/L (ref 20–32)
Calcium: 9.8 mg/dL (ref 8.6–10.4)
Chloride: 102 mmol/L (ref 98–110)
Creat: 0.75 mg/dL (ref 0.60–0.88)
GFR, Est African American: 87 mL/min/{1.73_m2} (ref >=60)
GFR, Est Non African American: 75 mL/min/{1.73_m2} (ref >=60)
Globulin: 2.4 g/dL (calc) (ref 1.9–3.7)
Glucose, Bld: 97 mg/dL (ref 65–99)
Potassium: 4.3 mmol/L (ref 3.5–5.3)
Sodium: 138 mmol/L (ref 135–146)
Total Bilirubin: 0.5 mg/dL (ref 0.2–1.2)
Total Protein: 6.7 g/dL (ref 6.1–8.1)

## 2019-07-05 LAB — LIPID PANEL
Cholesterol: 178 mg/dL (ref <200)
HDL: 62 mg/dL (ref >=50)
LDL Cholesterol (Calc): 93 mg/dL (calc)
Non-HDL Cholesterol (Calc): 116 mg/dL (calc) (ref <130)
Total CHOL/HDL Ratio: 2.9 (calc) (ref <5.0)
Triglycerides: 130 mg/dL (ref <150)

## 2019-07-05 LAB — CBC WITH DIFFERENTIAL/PLATELET
Absolute Monocytes: 846 cells/uL (ref 200–950)
Basophils Absolute: 71 cells/uL (ref 0–200)
Basophils Relative: 0.8 %
Eosinophils Absolute: 329 cells/uL (ref 15–500)
Eosinophils Relative: 3.7 %
HCT: 43.6 % (ref 35.0–45.0)
Hemoglobin: 14.9 g/dL (ref 11.7–15.5)
Lymphs Abs: 2029 cells/uL (ref 850–3900)
MCH: 31.6 pg (ref 27.0–33.0)
MCHC: 34.2 g/dL (ref 32.0–36.0)
MCV: 92.4 fL (ref 80.0–100.0)
MPV: 11 fL (ref 7.5–12.5)
Monocytes Relative: 9.5 %
Neutro Abs: 5625 cells/uL (ref 1500–7800)
Neutrophils Relative %: 63.2 %
Platelets: 350 10*3/uL (ref 140–400)
RBC: 4.72 10*6/uL (ref 3.80–5.10)
RDW: 12.9 % (ref 11.0–15.0)
Total Lymphocyte: 22.8 %
WBC: 8.9 10*3/uL (ref 3.8–10.8)

## 2019-07-05 LAB — MAGNESIUM: Magnesium: 2 mg/dL (ref 1.5–2.5)

## 2019-07-05 MED ORDER — DULOXETINE HCL 30 MG PO CPEP: ORAL_CAPSULE | ORAL | 2 refills | Status: DC

## 2019-07-05 NOTE — Patient Instructions (Addendum)
  [name] [name] , Thank you for taking time to come for your Medicare Wellness Visit. I appreciate your ongoing commitment to your health goals. Please review the following plan we discussed and let me know if I can assist you in the future.   These are the goals we discussed: Goals    . DIET - INCREASE WATER INTAKE     4-5 bottles daily     . Exercise 150 min/wk Moderate Activity    . LDL CALC < 100       This is a list of the screening recommended for you and due dates:  Health Maintenance  Topic Date Due  . Colon Cancer Screening  05/17/2020  . Tetanus Vaccine  04/24/2024  . Flu Shot  Completed  . DEXA scan (bone density measurement)  Completed  . Pneumonia vaccines  Completed     Please schedule a mammogram and a follow up eye appointment   Ask the stomach doctor if there are any medications you can stop taking  Try STOPPING carafate over the weekend and compare with/without to see if this is even helping - if no big difference, ask if you can stop  Try going back to previous formulation of gabapentin   Keep a log of your symptoms, what medications you are taking, what you are eating   Think what changed before your symptoms started in December?    HOW TO SCHEDULE A MAMMOGRAM  [facility]  7 a.m.-6:30 p.m., Monday 7 a.m.-5 p.m., Tuesday-Friday Schedule an appointment by calling [phone].  [facility] Schedule an appointment by calling [phone].

## 2019-07-09 ENCOUNTER — Ambulatory Visit (INDEPENDENT_AMBULATORY_CARE_PROVIDER_SITE_OTHER): Payer: PPO | Admitting: Gastroenterology

## 2019-07-09 ENCOUNTER — Encounter: Payer: Self-pay | Admitting: Gastroenterology

## 2019-07-09 ENCOUNTER — Other Ambulatory Visit: Payer: Self-pay

## 2019-07-09 VITALS — BP 110/66 | HR 96 | Temp 97.6°F | Ht 61.5 in | Wt 168.1 lb

## 2019-07-09 DIAGNOSIS — K5909 Other constipation: Secondary | ICD-10-CM | POA: Diagnosis not present

## 2019-07-09 DIAGNOSIS — R140 Abdominal distension (gaseous): Secondary | ICD-10-CM | POA: Diagnosis not present

## 2019-07-09 DIAGNOSIS — R1013 Epigastric pain: Secondary | ICD-10-CM | POA: Diagnosis not present

## 2019-07-09 NOTE — Progress Notes (Signed)
07/09/2019 [place] [id number] 01/05/1936   HISTORY OF PRESENT ILLNESS: This is an 84 year old female who is a patient of Dr. Doyne Keel.  She was just seen by him on February 10 for complaints of epigastric/upper abdominal pain.  Please see that note for further details.  She was advised to follow-up with her pain management specialist to consider changing her medication regimen, consider Cymbalta, consider trigger point injections.  She says that really no changes were made.  She goes back again in about 2 weeks.  She says that she has been having problems with the gabapentin messing with her eyes.  Nonetheless, she comes here today with the same complaints of upper abdominal/epigastric pain.  Also reports upper abdominal bloating, which she says is new since her last visit here.  She was started on Linzess for her constipation and discontinued that a few days ago as she read that bloating could be a side effect of the medication.  They are here today basically wanting to schedule an endoscopy.  Is on her omeprazole twice daily, but discontinued her Carafate.   Past Medical History:  Diagnosis Date  . Anxiety    takes Xanax daily as needed  . Asthma    Albuterol daily as needed  . Blood transfusion without reported diagnosis   . CAD (coronary artery disease)    LHC 2003 with 40% pLAD, 30% mLAD, 100% D1 (moderate vessel), 100%  D2 (small vessel), 95% mid small OM2.  Pt had PTCA to D1;  D2 and OM2 small vessels and tx medically;  Last Myoview (2012): anterior infarct seen with scar, no ischemia. EF normal. Med Rx // Myoview (12/14):  Low risk; ant scar w/ peri-infarct ischemia; EF 55% with ant AK // Myoview 5/16: EF 55-65, ant, apical scar, no ischemia   . Cataract   . Chronic back pain    HNP, DDD, spinal stenosis, vertebral compression fractures.   . Chronic nausea    takes Zofran daily as needed.  normal gastric emptying study 03/2013  . CKD (chronic kidney disease), stage III  09/27/2017  . Constipation    felt to be functional. takes Senokot and Miralax daily.  treated with Linzess in past.   . DIVERTICULOSIS, COLON 08/28/2008   Qualifier: Diagnosis of  By: [name], [name], [name]    . Elevated LFTs 2015   probably med induced DILI, from Augmentin vs Pravastatin  . GERD (gastroesophageal reflux disease)    hx of esophageal stricture   . Hiatal hernia    Paraesophageal hernias. s/p fundoplications in 0000000 and 04/2013  . History of bronchitis several of yrs ago  . History of colon polyps 03/2009, 03/2011   adenomatous colon polyps, no high grade dysplasia.   [name] History of vertigo    no meds  . Hyperlipidemia    takes Pravastatin daily  . Hypertension    takes Amlodipine,Metoprolol,and Losartan daily  . Nausea 03/2016  . Osteoporosis   . PONV (postoperative nausea and vomiting)    yrs ago  . Slow urinary stream    takes Rapaflo daily  . TIA (transient ischemic attack) 1995   no residual effects noted   Past Surgical History:  Procedure Laterality Date  . APPENDECTOMY     patient unsure of date  . BACK SURGERY    . BLADDER REPAIR    . CHOLECYSTECTOMY     Patient unsure of date.  . CORONARY ANGIOPLASTY  11/16/2001   1 stent  . ESOPHAGEAL MANOMETRY  N/A 03/25/2013   Procedure: ESOPHAGEAL MANOMETRY (EM);  Surgeon: Inda Castle, MD;  Location: [facility] ENDOSCOPY;  Service: Endoscopy;  Laterality: N/A;  . ESOPHAGOGASTRODUODENOSCOPY N/A 03/15/2013   Procedure: ESOPHAGOGASTRODUODENOSCOPY (EGD);  Surgeon: Jerene Bears, MD;  Location: [facility] ENDOSCOPY;  Service: Endoscopy;  Laterality: N/A;  . ESOPHAGOGASTRODUODENOSCOPY N/A 12/25/2015   Procedure: ESOPHAGOGASTRODUODENOSCOPY (EGD);  Surgeon: Doran Stabler, MD;  Location: [facility] ENDOSCOPY;  Service: Endoscopy;  Laterality: N/A;  . EYE SURGERY  11/2000   bilateral cataracts with lens implant  . FIXATION KYPHOPLASTY LUMBAR SPINE  X 2   "L3-4"  . HEMORRHOID SURGERY    . HIATAL HERNIA REPAIR  06/03/2011   Procedure:  LAPAROSCOPIC REPAIR OF HIATAL HERNIA;  Surgeon: Adin Hector, MD;  Location: [facility] ORS;  Service: General;  Laterality: N/A;  . HIATAL HERNIA REPAIR N/A 04/24/2013   Procedure: LAPAROSCOPIC  REPAIR RECURRENT PARASOPHAGEAL HIATAL HERNIA WITH FUNDOPLICATION;  Surgeon: Adin Hector, MD;  Location: [facility] ORS;  Service: General;  Laterality: N/A;  . INSERTION OF MESH N/A 04/24/2013   Procedure: INSERTION OF MESH;  Surgeon: Adin Hector, MD;  Location: [facility] ORS;  Service: General;  Laterality: N/A;  . IR KYPHO THORACIC WITH BONE BIOPSY  09/29/2017  . IR KYPHO THORACIC WITH BONE BIOPSY  11/03/2017  . IR KYPHO THORACIC WITH BONE BIOPSY  02/14/2019  . IR KYPHO THORACIC WITH BONE BIOPSY  04/02/2019  . LAPAROSCOPIC LYSIS OF ADHESIONS N/A 04/24/2013   Procedure: LAPAROSCOPIC LYSIS OF ADHESIONS;  Surgeon: Adin Hector, MD;  Location: [facility] OR[facility];  Service: General;  Laterality: N/A;  . LAPAROSCOPIC NISSEN FUNDOPLICATION  [id number]   Procedure: LAPAROSCOPIC NISSEN FUNDOPLICATION;  Surgeon: Adin Hector, MD;  Location: [facility] OR[facility];  Service: General;  Laterality: N/A;  . LUMBAR FUSION  12/07/2015   Right L2-3 facetectomy, posterolateral fusion L2-3 with pedicle screws, rods, local bone graft, Vivigen cancellous chips. Fusion extended to the L1 level with pedicle screws and rods  . LUMBAR LAMINECTOMY/DECOMPRESSION MICRODISCECTOMY Right 06/26/2012   Procedure: LUMBAR LAMINECTOMY/DECOMPRESSION MICRODISCECTOMY;  Surgeon: Jessy Oto, MD;  Location: [place];  Service: Orthopedics;  Laterality: Right;  RIGHT L4-5 MICRODISCECTOMY  . LUMBAR LAMINECTOMY/DECOMPRESSION MICRODISCECTOMY N/A 05/29/2015   Procedure: RIGHT L2-3 MICRODISCECTOMY;  Surgeon: Jessy Oto, MD;  Location: [place];  Service: Orthopedics;  Laterality: N/A;  . TOTAL ABDOMINAL HYSTERECTOMY  1975   partial  . UPPER GASTROINTESTINAL ENDOSCOPY      reports that she has never smoked. She has never used smokeless tobacco. She reports that she does not drink  alcohol or use drugs. family history includes Brain cancer in her brother; Colon cancer (age of onset: 61) in her mother; Heart attack (age of onset: 47) in her father; Heart disease in her brother and brother; Kidney disease in her daughter. Allergies  Allergen Reactions  . Ace Inhibitors Cough    Per Dr Melvyn Novas pulmonology 2013  . Aspartame Diarrhea and Nausea And Vomiting  . Atorvastatin Other (See Comments)    Muscle aches  . Biaxin [Clarithromycin] Other (See Comments)    Unknown reaction : PATIENT CAN TOLERATE Z-PAK.  Is written on patient's paper chart.  [name] [Roflumilast] Nausea And Vomiting  . Erythromycin Other (See Comments)    Unknown reaction: PATIENT CAN TOLERATE Z-PAK.  It is written on patient's paper chart.  . Levofloxacin Nausea And Vomiting      Outpatient Encounter Medications as of 07/09/2019  Medication Sig  . acetaminophen (TYLENOL) 500 MG tablet  Take 1 tablet (500 mg total) by mouth every 6 (six) hours as needed.  . ALPRAZolam (XANAX) 0.5 MG tablet Take 1/2 tablet 1- 2 x /day ONLY if needed for Anxiety Attack &  limit to 5 days /week to avoid Addiction - DO NOT take with Hydrocodone or Codeine (Patient taking differently: Take 0.25-0.5 mg by mouth daily as needed for anxiety. Take 1/2 tablet 1- 2 x /day ONLY if needed for Anxiety Attack &  limit to 5 days /week to avoid Addiction - DO NOT take with Hydrocodone or Codeine)  . amLODipine (NORVASC) 10 MG tablet Take 1 tablet Daily for BP & Heart  . ascorbic acid (VITAMIN C) 500 MG tablet Take 500 mg by mouth 2 (two) times daily.  [name] aspirin EC 81 MG tablet Take 81 mg by mouth at bedtime.   [name] BREO ELLIPTA 100-25 MCG/INH AEPB INHALE 1 PUFF BY MOUTH ONCE DAILY. RINSE MOUTH WITH WATER AFTER EACH USE.  [name] Cholecalciferol (VITAMIN D3) 5000 units CAPS Take 5,000 Units by mouth 2 (two) times daily.   [name] Coral Calcium 1000 (390 Ca) MG TABS Take 1,000 mg by mouth daily.  [name] denosumab (PROLIA) 60 MG/ML SOSY injection Inject 60 mg  into the skin every 6 (six) months.  . diclofenac sodium (VOLTAREN) 1 % GEL Apply 2 g topically 4 (four) times daily. (Patient taking differently: Apply 2 g topically 4 (four) times daily as needed (pain). )  . DULoxetine (CYMBALTA) 30 MG capsule Start taking 1 cap daily, for mood and chronic pain; increase to 2 caps in 1 month if tolerating well.  . gabapentin (NEURONTIN) 800 MG tablet Take 1/2 to 1 tablet 3 x /day for Pain (Patient taking differently: Take 400 mg by mouth in the morning, at noon, in the evening, and at bedtime. 830, 1230, 430, 830p)  . ipratropium-albuterol (DUONEB) 0.5-2.5 (3) MG/3ML SOLN Take 3 mLs by nebulization every 6 (six) hours as needed (shortness of breath/wheezing).   [name] ketoconazole (NIZORAL) 2 % cream  APPLY ONE APPLICATION TOPICALLY TWO TIMES DAILY (Patient taking differently: Apply 1 application topically 2 (two) times daily as needed for irritation. )  . Magnesium 400 MG TABS Take 400 mg by mouth 2 (two) times daily.   . meclizine (ANTIVERT) 25 MG tablet Take 1/2 to 1 tablet 2 to 3 x /day ONLY if needed  For Dizziness / Vertigo (Patient taking differently: Take 12.5-25 mg by mouth 3 (three) times daily as needed for dizziness. Take 1/2 to 1 tablet 2 to 3 x /day ONLY if needed  For Dizziness / Vertigo)  . metoprolol tartrate (LOPRESSOR) 50 MG tablet Take 1 tablet 2 x /day for BP & Heart (Patient taking differently: Take 50 mg by mouth 2 (two) times daily. Take 1 tablet 2 x /day for BP & Heart)  . mirabegron ER (MYRBETRIQ) 25 MG TB24 tablet Take 25 mg by mouth daily.  . Multiple Vitamins-Minerals (MULTIVITAMIN WITH MINERALS) tablet Take 1 tablet by mouth daily.  . nitroGLYCERIN (NITROSTAT) 0.4 MG SL tablet Place 1 tablet (0.4 mg total) under the tongue every 5 (five) minutes as needed for chest pain.  [name] omeprazole (PRILOSEC) 40 MG capsule Take 1 capsule 2 x /day for Indigestion , Heartburn & Nausea (Patient taking differently: Take 40 mg by mouth 2 (two) times daily. Take  1 capsule 2 x /day for Indigestion , Heartburn & Nausea)  . polyethylene glycol powder (GLYCOLAX/MIRALAX) powder Take 17 g by mouth 2 (two) times daily. (Patient  taking differently: Take 17 g by mouth as needed. )  . pravastatin (PRAVACHOL) 40 MG tablet Take 40 mg by mouth daily.  . promethazine (PHENERGAN) 25 MG tablet Take 1 tablet every 4 hours if needed for Nausea or Vomitting  . senna-docusate (SENOKOT-S) 8.6-50 MG tablet Take 2 tablets by mouth 2 (two) times daily.  . vitamin B-12 (CYANOCOBALAMIN) 1000 MCG tablet Take 1,000 mcg by mouth daily.  [name] zinc gluconate 50 MG tablet Take 50 mg by mouth daily.  [name] linaclotide (LINZESS) 145 MCG CAPS capsule Take 1 capsule (145 mcg total) by mouth daily before breakfast. (Patient not taking: Reported on 07/09/2019)  . sucralfate (CARAFATE) 1 g tablet Take 1 tablet 2 to 4 x /day before meals for Heartburn (Patient not taking: Reported on 07/09/2019)   Facility-Administered Encounter Medications as of 07/09/2019  Medication  . calcitonin (salmon) (MIACALCIN/FORTICAL) nasal spray 1 spray     REVIEW OF SYSTEMS  : All other systems reviewed and negative except where noted in the History of Present Illness.   PHYSICAL EXAM: BP 110/66 (BP Location: Left Arm, Patient Position: Sitting, Cuff Size: Normal)   Pulse 96   Temp 97.6 F (36.4 C)   Ht 5' 1.5" (1.562 m)   Wt 168 lb 2 oz (76.3 kg)   BMI 31.25 kg/m  General: Well developed white female in no acute distress Head: Normocephalic and atraumatic Eyes:  Sclerae anicteric, conjunctiva pink. Ears: Normal auditory acuity Lungs: Clear throughout to auscultation; no increased WOB. Heart: Regular rate and rhythm; no M/R/G. Abdomen: Soft, non-distended.  BS present.  Upper abdominal TTP, mostly in the epigastrium. Musculoskeletal: Symmetrical with no gross deformities  Skin: No lesions on visible extremities Extremities: No edema  Neurological: Alert oriented x 4, grossly non-focal Psychological:  Alert  and cooperative. Normal mood and affect  ASSESSMENT AND PLAN: *Epigastric/upper abdominal pain and bloating:  Continues symptoms.  She says that bloating is new since her last visit and was not sure if it was related to the [place] so she discontinued it for now.  Will plan for EGD with Dr. Havery Moros to rule out esophagitis, ulcer, etc. We will continue omeprazole 40 mg twice daily.  She discontinued her Carafate, but advised that she restart it and maybe even make it into a slurry to help cover the lower esophagus more as well. *Chronic constipation: Discontinued Linzess as she thought it may be causing some of her upper abdominal bloating.  Will manage with MiraLAX and high-fiber diet for now.   CC:  [name], MD

## 2019-07-09 NOTE — Progress Notes (Signed)
See detailed note from 2/10 for details of her case. I think her pain may most likely be musculoskeletal and had recommended she see her pain management physician for that. If EGD will put her at piece of mind and she strongly wishes to proceed we can do that but I think yield may be low for finding a cause to her symptoms.

## 2019-07-09 NOTE — Patient Instructions (Signed)
If you are age 82 or older, your body mass index should be between 23-30. Your Body mass index is 31.25 kg/m. If this is out of the aforementioned range listed, please consider follow up with your Primary Care Provider.  If you are age 17 or younger, your body mass index should be between 19-25. Your Body mass index is 31.25 kg/m. If this is out of the aformentioned range listed, please consider follow up with your Primary Care Provider.   You have been scheduled for an endoscopy. Please follow written instructions given to you at your visit today. If you use inhalers (even only as needed), please bring them with you on the day of your procedure. Your physician has requested that you go to www.startemmi.com and enter the access code given to you at your visit today. This web site gives a general overview about your procedure. However, you should still follow specific instructions given to you by our office regarding your preparation for the procedure.  Restart Carafate as slurry.  Thank you for choosing me and [facility].   Alonza Bogus, PA-C

## 2019-07-11 ENCOUNTER — Other Ambulatory Visit: Payer: Self-pay

## 2019-07-11 ENCOUNTER — Encounter: Payer: Self-pay | Admitting: Gastroenterology

## 2019-07-11 ENCOUNTER — Ambulatory Visit (AMBULATORY_SURGERY_CENTER): Payer: PPO | Admitting: Gastroenterology

## 2019-07-11 VITALS — BP 162/81 | HR 61 | Temp 97.1°F | Resp 15 | Ht 61.0 in | Wt 168.0 lb

## 2019-07-11 DIAGNOSIS — N183 Chronic kidney disease, stage 3 unspecified: Secondary | ICD-10-CM | POA: Diagnosis not present

## 2019-07-11 DIAGNOSIS — K3189 Other diseases of stomach and duodenum: Secondary | ICD-10-CM

## 2019-07-11 DIAGNOSIS — J45909 Unspecified asthma, uncomplicated: Secondary | ICD-10-CM | POA: Diagnosis not present

## 2019-07-11 DIAGNOSIS — K219 Gastro-esophageal reflux disease without esophagitis: Secondary | ICD-10-CM | POA: Diagnosis not present

## 2019-07-11 DIAGNOSIS — R140 Abdominal distension (gaseous): Secondary | ICD-10-CM | POA: Diagnosis not present

## 2019-07-11 DIAGNOSIS — I129 Hypertensive chronic kidney disease with stage 1 through stage 4 chronic kidney disease, or unspecified chronic kidney disease: Secondary | ICD-10-CM | POA: Diagnosis not present

## 2019-07-11 DIAGNOSIS — R1013 Epigastric pain: Secondary | ICD-10-CM

## 2019-07-11 DIAGNOSIS — I2510 Atherosclerotic heart disease of native coronary artery without angina pectoris: Secondary | ICD-10-CM | POA: Diagnosis not present

## 2019-07-11 MED ORDER — SODIUM CHLORIDE 0.9 % IV SOLN: 500.0000 mL | Freq: Once | INTRAVENOUS | Status: DC

## 2019-07-11 MED ORDER — FLUCONAZOLE 100 MG PO TABS: ORAL_TABLET | ORAL | 0 refills | Status: AC

## 2019-07-11 NOTE — Patient Instructions (Signed)
Start fluconazole for 2 weeks  Await pathology results     YOU HAD AN ENDOSCOPIC PROCEDURE TODAY AT [facility]:   Refer to the procedure report that was given to you for any specific questions about what was found during the examination.  If the procedure report does not answer your questions, please call your gastroenterologist to clarify.  If you requested that your care partner not be given the details of your procedure findings, then the procedure report has been included in a sealed envelope for you to review at your convenience later.  YOU SHOULD EXPECT: Some feelings of bloating in the abdomen. Passage of more gas than usual.  Walking can help get rid of the air that was put into your GI tract during the procedure and reduce the bloating. If you had a lower endoscopy (such as a colonoscopy or flexible sigmoidoscopy) you may notice spotting of blood in your stool or on the toilet paper. If you underwent a bowel prep for your procedure, you may not have a normal bowel movement for a few days.  Please Note:  You might notice some irritation and congestion in your nose or some drainage.  This is from the oxygen used during your procedure.  There is no need for concern and it should clear up in a day or so.  SYMPTOMS TO REPORT IMMEDIATELY:   Following upper endoscopy (EGD)  Vomiting of blood or coffee ground material  New chest pain or pain under the shoulder blades  Painful or persistently difficult swallowing  New shortness of breath  Fever of 100F or higher  Black, tarry-looking stools  For urgent or emergent issues, a gastroenterologist can be reached at any hour by calling [phone]. Do not use MyChart messaging for urgent concerns.    DIET:  We do recommend a small meal at first, but then you may proceed to your regular diet.  Drink plenty of fluids but you should avoid alcoholic beverages for 24 hours.  ACTIVITY:  You should plan to take it easy for the  rest of today and you should NOT DRIVE or use heavy machinery until tomorrow (because of the sedation medicines used during the test).    FOLLOW UP: Our staff will call the number listed on your records 48-72 hours following your procedure to check on you and address any questions or concerns that you may have regarding the information given to you following your procedure. If we do not reach you, we will leave a message.  We will attempt to reach you two times.  During this call, we will ask if you have developed any symptoms of COVID 19. If you develop any symptoms (ie: fever, flu-like symptoms, shortness of breath, cough etc.) before then, please call [phone].  If you test positive for Covid 19 in the 2 weeks post procedure, please call and report this information to [place].    If any biopsies were taken you will be contacted by phone or by letter within the next 1-3 weeks.  Please call us at [phone] if you have not heard about the biopsies in 3 weeks.    SIGNATURES/CONFIDENTIALITY: You and/or your care partner have signed paperwork which will be entered into your electronic medical record.  These signatures attest to the fact that that the information above on your After Visit Summary has been reviewed and is understood.  Full responsibility of the confidentiality of this discharge information lies with you and/or your care-partner.

## 2019-07-11 NOTE — Progress Notes (Signed)
Temp by LC, V/S by KA   Pt's states no medical or surgical changes since previsit or office visit.

## 2019-07-11 NOTE — Progress Notes (Signed)
Called to room to assist during endoscopic procedure.  Patient ID and intended procedure confirmed with present staff. Received instructions for my participation in the procedure from the performing physician.  

## 2019-07-11 NOTE — Op Note (Signed)
[facility] Patient Name: [name] [name] Procedure Date: 07/11/2019 11:36 AM MRN: [id number] Endoscopist: Remo Lipps P. Havery Moros , MD Age: 84 Referring MD:  Date of Birth: Jan 15, 1936 Gender: Female Account #: [id number] Procedure:                Upper GI endoscopy Indications:              Epigastric abdominal pain, history of Nissen,                            negative imaging and labs Medicines:                Monitored Anesthesia Care Procedure:                Pre-Anesthesia Assessment:                           - Prior to the procedure, a History and Physical                            was performed, and patient medications and                            allergies were reviewed. The patient's tolerance of                            previous anesthesia was also reviewed. The risks                            and benefits of the procedure and the sedation                            options and risks were discussed with the patient.                            All questions were answered, and informed consent                            was obtained. Prior Anticoagulants: The patient has                            taken no previous anticoagulant or antiplatelet                            agents. ASA Grade Assessment: III - A patient with                            severe systemic disease. After reviewing the risks                            and benefits, the patient was deemed in                            satisfactory condition to undergo the procedure.  After obtaining informed consent, the endoscope was                            passed under direct vision. Throughout the                            procedure, the patient's blood pressure, pulse, and                            oxygen saturations were monitored continuously. The                            Endoscope was introduced through the mouth, and                            advanced to the second part  of duodenum. The upper                            GI endoscopy was accomplished without difficulty.                            The patient tolerated the procedure well. Scope In: Scope Out: Findings:                 The Z-line was regular.                           Patchy, white plaques were found in the middle                            third of the esophagus and in the lower third of                            the esophagus consistent with esophageal                            candidiasis.                           The lower third of the esophagus was tortuous with                            an angulated turn into the Nissen. No obvious                            stenosis noted at the GEJ (previously had this in                            2019 and dilated)                           The exam of the esophagus was otherwise normal.                           Evidence of a Nissen  fundoplication was found in                            the cardia. The wrap appeared intact. A retained                            suture was present. I attempted to remove it with                            forceps but it was buried into the mucosa and not                            removed.                           The exam of the stomach was otherwise normal.                           Biopsies were taken with a cold forceps in the                            gastric body, at the incisura and in the gastric                            antrum for Helicobacter pylori testing.                           The duodenal bulb and second portion of the                            duodenum were normal. Complications:            No immediate complications. Estimated blood loss:                            Minimal. Estimated Blood Loss:     Estimated blood loss was minimal. Impression:               - Z-line regular.                           - Esophageal plaques were found, consistent with                             candidiasis.                           - Tortuous esophagus with angulated turn entering                            Nissen.                           - Normal esophagus otherwise                           - A Nissen fundoplication  was found. The wrap                            appears intact with retained suture visualized.                           - Normal stomach otherwise - biopsies taken to rule                            out H pylori                           - Normal duodenal bulb and second portion of the                            duodenum.                           Unclear if esophageal candidiasis of the lower                            esophagus could be related to symptoms, will treat                            for it and assess response. Pain could otherwise be                            musculoskeletal in etiology based on prior                            evaluation. Recommendation:           - Patient has a contact number available for                            emergencies. The signs and symptoms of potential                            delayed complications were discussed with the                            patient. Return to normal activities tomorrow.                            Written discharge instructions were provided to the                            patient.                           - Resume previous diet.                           - Continue present medications.                           - Start fluconazole  400mg  x 1 and then another                            200mg  / day for 13 days if no medication                            interactions                           - Await pathology results. Remo Lipps P. Anabell Swint, MD 07/11/2019 12:08:38 PM This report has been signed electronically.

## 2019-07-11 NOTE — Progress Notes (Signed)
Report to PACU, RN, vss, BBS= Clear.  

## 2019-07-15 ENCOUNTER — Telehealth: Payer: Self-pay

## 2019-07-15 NOTE — Telephone Encounter (Signed)
LVM

## 2019-07-15 NOTE — Telephone Encounter (Signed)
  Follow up Call-  Call back number 07/11/2019 02/20/2018 01/24/2018 05/17/2017  Post procedure Call Back phone  # [phone]  Permission to leave phone message Yes Yes Yes Yes  Some recent data might be hidden     Patient questions:  Do you have a fever, pain , or abdominal swelling? No. Pain Score  0 *  Have you tolerated food without any problems? Yes.    Have you been able to return to your normal activities? Yes.    Do you have any questions about your discharge instructions: Diet   No. Medications  No. Follow up visit  No.  Do you have questions or concerns about your Care? No.  Actions: * If pain score is 4 or above: No action needed, pain <4.  1. Have you developed a fever since your procedure? no  2.   Have you had an respiratory symptoms (SOB or cough) since your procedure? no  3.   Have you tested positive for COVID 19 since your procedure no  4.   Have you had any family members/close contacts diagnosed with the COVID 19 since your procedure?  no   If yes to any of these questions please route to Joylene John, RN and Alphonsa Gin, Therapist, sports.

## 2019-07-16 ENCOUNTER — Encounter: Payer: Self-pay | Admitting: Gastroenterology

## 2019-07-17 ENCOUNTER — Encounter: Payer: PPO | Admitting: Gastroenterology

## 2019-07-22 ENCOUNTER — Other Ambulatory Visit: Payer: Self-pay

## 2019-07-22 DIAGNOSIS — M961 Postlaminectomy syndrome, not elsewhere classified: Secondary | ICD-10-CM | POA: Diagnosis not present

## 2019-07-22 DIAGNOSIS — M4726 Other spondylosis with radiculopathy, lumbar region: Secondary | ICD-10-CM | POA: Diagnosis not present

## 2019-07-22 DIAGNOSIS — M150 Primary generalized (osteo)arthritis: Secondary | ICD-10-CM | POA: Diagnosis not present

## 2019-07-22 DIAGNOSIS — R450 Nervousness: Secondary | ICD-10-CM

## 2019-07-22 DIAGNOSIS — G894 Chronic pain syndrome: Secondary | ICD-10-CM | POA: Diagnosis not present

## 2019-07-22 NOTE — Telephone Encounter (Signed)
Refill request for Alprazolam. Please review directions on the prescription before sending.

## 2019-07-23 ENCOUNTER — Telehealth: Payer: Self-pay | Admitting: Gastroenterology

## 2019-07-23 NOTE — Telephone Encounter (Signed)
Dr. Loni Muse - please advise.  Patient saw Janett Billow on 07-09-19 for epigastric pain and bloating and you did her procedure on 07-11-19. She is finishing her course of diflucan and is still having abdominal pain.

## 2019-07-24 NOTE — Telephone Encounter (Signed)
I saw her previously in Feb for this. Unfortunately her symptoms persist. She has had an extensive evaluation with imaging, endoscopy, none of which has found a clear cause. I think it less likely that her GI tract is causing her pain, I suspect this may more likely be musculoskeletal and have discussed that with her in the past. I had recommended considering Cymbalta and I see she was started on that recently, has she been taking it? If on it for a few weeks and tolerating it could increase from 30mg  to 60mg . I am happy to see her back in the office to discuss this further with her if you can help coordinate a follow up, but I ultimately think she may get more benefit from seeing pain management, not sure if this is related to her back pain.

## 2019-07-24 NOTE — Telephone Encounter (Signed)
Called and spoke to pt. She didn't realize Dr. Loni Muse had prescribed Cymbalta however,  Dr. Idell Pickles office prescribed Cymbalta last month on 2-26. I encouraged her to stay on 30 mg and then increase to 60 mg daily as prescribed by Dr. Loni Muse and Dr. Melford Aase. She asked about Carafate for heartburn and indicated she is taking that 3 times a day and at bedtime and is not sure if she is getting any benefit. I reminded her that Dr. Loni Muse had discussed with her that her pain may be musculoskeletal and she may need to follow with pain management.  She had an appt with pain management on Monday and let them know she is taking Cymbalta and they thought that was a good idea.  She will follow up with Dr. Melford Aase regarding how she does on Cymbalta since he was the most recent to prescribe it.

## 2019-07-24 NOTE — Telephone Encounter (Signed)
Thanks Jan 

## 2019-07-30 NOTE — Progress Notes (Signed)
Subjective:    Patient ID: [name] [name], female    DOB: June 28, 1935, 84 y.o.   MRN: [id number]  HPI      Patient is a nice 84 yo MWF with HTN who denies any c/o of HA's, dizziness, CP, palpitations, dyspnea or deprendent edema.  She also has concerns re: a recently enlarging skin lesion of her dorsal Rt hand.  Medication Sig  . acetaminophen  500 MG tablet Take 1 tablet  every 6 ( hours as needed.  . ALPRAZolam (0.5 MG tablet Take 1/2 tablet 1- 2 x /day ONLY if needed   . amLODipine  10 MG tablet Take 1 tablet Daily for BP & Heart  . VITAMIN C 500 MG tablet Take  2  times daily.  [name] aspirin EC 81 MG tablet Take 81 mg by mouth at bedtime.   [name] BREO ELLIPTA 100-25  INHALE 1 PUFF  ONCE DAILY.  Tivis Ringer  D 5000 units  Take 5,000 Units by mouth 2 (two) times daily.   . Coral Calcium 1000 (390 Ca) MG TABS Take 1,000 mg  daily.  [name] denosumab (PROLIA) 60 MG/ML SOSY injection Inject 60 mg into the skin every 6 (six) months.  . diclofenac sodium (VOLTAREN) 1 % GEL Apply 2 g topically 4 (four) times daily. (Patient taking differently: Apply 2 g topically 4 (four) times daily as needed (pain). )  . DULoxetine (CYMBALTA) 30 MG capsule Start taking 1 cap daily, for mood and chronic pain; increase to 2 caps in 1 month if tolerating well.  . gabapentin (NEURONTIN) 800 MG tablet Take 1/2 to 1 tablet 3 x /day for Pain (Patient taking differently: Take 400 mg by mouth in the morning, at noon, in the evening, and at bedtime. 830, 1230, 430, 830p)  . ipratropium-albuterol (DUONEB) 0.5-2.5 (3) MG/3ML SOLN Take 3 mLs by nebulization every 6 (six) hours as needed (shortness of breath/wheezing).   [name] ketoconazole (NIZORAL) 2 % cream  APPLY ONE APPLICATION TOPICALLY TWO TIMES DAILY (Patient taking differently: Apply 1 application topically 2 (two) times daily as needed for irritation. )  . linaclotide (LINZESS) 145 MCG CAPS capsule Take 1 capsule (145 mcg total) by mouth daily before breakfast.  . Magnesium 400 MG  TABS Take 400 mg by mouth 2 (two) times daily.   . meclizine (ANTIVERT) 25 MG tablet Take 1/2 to 1 tablet 2 to 3 x /day ONLY if needed  For Dizziness / Vertigo (Patient taking differently: Take 12.5-25 mg by mouth 3 (three) times daily as needed for dizziness. Take 1/2 to 1 tablet 2 to 3 x /day ONLY if needed  For Dizziness / Vertigo)  . metoprolol tartrate (LOPRESSOR) 50 MG tablet Take 1 tablet 2 x /day for BP & Heart (Patient taking differently: Take 50 mg by mouth 2 (two) times daily. Take 1 tablet 2 x /day for BP & Heart)  . mirabegron ER (MYRBETRIQ) 25 MG TB24 tablet Take 25 mg by mouth daily.  . Multiple Vitamins-Minerals (MULTIVITAMIN WITH MINERALS) tablet Take 1 tablet by mouth daily.  . nitroGLYCERIN (NITROSTAT) 0.4 MG SL tablet Place 1 tablet (0.4 mg total) under the tongue every 5 (five) minutes as needed for chest pain.  [name] omeprazole (PRILOSEC) 40 MG capsule Take 1 capsule 2 x /day for Indigestion , Heartburn & Nausea (Patient taking differently: Take 40 mg by mouth 2 (two) times daily. Take 1 capsule 2 x /day for Indigestion , Heartburn & Nausea)  . polyethylene glycol  powder (GLYCOLAX/MIRALAX) powder Take 17 g by mouth 2 (two) times daily. (Patient taking differently: Take 17 g by mouth as needed. )  . pravastatin (PRAVACHOL) 40 MG tablet Take 40 mg by mouth daily.  . promethazine (PHENERGAN) 25 MG tablet Take 1 tablet every 4 hours if needed for Nausea or Vomitting  . senna-docusate (SENOKOT-S) 8.6-50 MG tablet Take 2 tablets by mouth 2 (two) times daily.  . sucralfate (CARAFATE) 1 g tablet Take 1 tablet 2 to 4 x /day before meals for Heartburn  . vitamin B-12 (CYANOCOBALAMIN) 1000 MCG tablet Take 1,000 mcg by mouth daily.  [name] zinc gluconate 50 MG tablet Take 50 mg by mouth daily.   Facility-Administered Medications Prior to Visit   Allergies  Allergen Reactions  . Ace Inhibitors Cough    Per Dr Melvyn Novas pulmonology 2013  . Aspartame Diarrhea and Nausea And Vomiting  . Atorvastatin  Other (See Comments)    Muscle aches  . Biaxin [Clarithromycin] Other (See Comments)    Unknown reaction : PATIENT CAN TOLERATE Z-PAK.  Is written on patient's paper chart.  [name] [Roflumilast] Nausea And Vomiting  . Erythromycin Other (See Comments)    Unknown reaction: PATIENT CAN TOLERATE Z-PAK.  It is written on patient's paper chart.  . Levofloxacin Nausea And Vomiting   Past Medical History:  Diagnosis Date  . Anxiety    takes Xanax daily as needed  . Asthma    Albuterol daily as needed  . Blood transfusion without reported diagnosis   . CAD (coronary artery disease)    LHC 2003 with 40% pLAD, 30% mLAD, 100% D1 (moderate vessel), 100%  D2 (small vessel), 95% mid small OM2.  Pt had PTCA to D1;  D2 and OM2 small vessels and tx medically;  Last Myoview (2012): anterior infarct seen with scar, no ischemia. EF normal. Med Rx // Myoview (12/14):  Low risk; ant scar w/ peri-infarct ischemia; EF 55% with ant AK // Myoview 5/16: EF 55-65, ant, apical scar, no ischemia   . Cataract   . Chronic back pain    HNP, DDD, spinal stenosis, vertebral compression fractures.   . Chronic nausea    takes Zofran daily as needed.  normal gastric emptying study 03/2013  . CKD (chronic kidney disease), stage III 09/27/2017  . Constipation    felt to be functional. takes Senokot and Miralax daily.  treated with Linzess in past.   . DIVERTICULOSIS, COLON 08/28/2008   Qualifier: Diagnosis of  By: [name], [name], [name]    . Elevated LFTs 2015   probably med induced DILI, from Augmentin vs Pravastatin  . GERD (gastroesophageal reflux disease)    hx of esophageal stricture   . Hiatal hernia    Paraesophageal hernias. s/p fundoplications in 0000000 and 04/2013  . History of bronchitis several of yrs ago  . History of colon polyps 03/2009, 03/2011   adenomatous colon polyps, no high grade dysplasia.   [name] History of vertigo    no meds  . Hyperlipidemia    takes Pravastatin daily  . Hypertension    takes  Amlodipine,Metoprolol,and Losartan daily  . Nausea 03/2016  . Osteoporosis   . PONV (postoperative nausea and vomiting)    yrs ago  . Slow urinary stream    takes Rapaflo daily  . TIA (transient ischemic attack) 1995   no residual effects noted   Past Surgical History:  Procedure Laterality Date  . APPENDECTOMY     patient unsure of date  . BACK  SURGERY    . BLADDER REPAIR    . CHOLECYSTECTOMY     Patient unsure of date.  . CORONARY ANGIOPLASTY  11/16/2001   1 stent  . ESOPHAGEAL MANOMETRY N/A 03/25/2013   Procedure: ESOPHAGEAL MANOMETRY (EM);  Surgeon: Inda Castle, MD;  Location: [facility];  Service: Endoscopy;  Laterality: N/A;  . ESOPHAGOGASTRODUODENOSCOPY N/A 03/15/2013   Procedure: ESOPHAGOGASTRODUODENOSCOPY (EGD);  Surgeon: Jerene Bears, MD;  Location[facility];  Service: Endoscopy;  Laterality: N/A;  . ESOPHAGOGASTRODUODENOSCOPY N/A 12/25/2015   Procedure: ESOPHAGOGASTRODUODENOSCOPY (EGD);  Surgeon: Doran Stabler, MD;  Location: [facility] ENDOSCOPY;  Service: Endoscopy;  Laterality: N/A;  . EYE SURGERY  11/2000   bilateral cataracts with lens implant  . FIXATION KYPHOPLASTY LUMBAR SPINE  X 2   "L3-4"  . HEMORRHOID SURGERY    . HIATAL HERNIA REPAIR  06/03/2011   Procedure: LAPAROSCOPIC REPAIR OF HIATAL HERNIA;  Surgeon: Adin Hector, MD;  Location: [facility];  Service: General;  Laterality: N/A;  . HIATAL HERNIA REPAIR N/A 04/24/2013   Procedure: LAPAROSCOPIC  REPAIR RECURRENT PARASOPHAGEAL HIATAL HERNIA WITH FUNDOPLICATION;  Surgeon: Adin Hector, MD;  Location: [facility];  Service: General;  Laterality: N/A;  . INSERTION OF MESH N/A 04/24/2013   Procedure: INSERTION OF MESH;  Surgeon: Adin Hector, MD;  Location: [facility];  Service: General;  Laterality: N/A;  . IR KYPHO THORACIC WITH BONE BIOPSY  09/29/2017  . IR KYPHO THORACIC WITH BONE BIOPSY  11/03/2017  . IR KYPHO THORACIC WITH BONE BIOPSY  02/14/2019  . IR KYPHO THORACIC WITH BONE BIOPSY  04/02/2019  .  LAPAROSCOPIC LYSIS OF ADHESIONS N/A 04/24/2013   Procedure: LAPAROSCOPIC LYSIS OF ADHESIONS;  Surgeon: Adin Hector, MD;  Location: [facility];  Service: General;  Laterality: N/A;  . LAPAROSCOPIC NISSEN FUNDOPLICATION  [id number]   Procedure: LAPAROSCOPIC NISSEN FUNDOPLICATION;  Surgeon: Adin Hector, MD;  Location: [facility];  Service: General;  Laterality: N/A;  . LUMBAR FUSION  12/07/2015   Right L2-3 facetectomy, posterolateral fusion L2-3 with pedicle screws, rods, local bone graft, Vivigen cancellous chips. Fusion extended to the L1 level with pedicle screws and rods  . LUMBAR LAMINECTOMY/DECOMPRESSION MICRODISCECTOMY Right 06/26/2012   Procedure: LUMBAR LAMINECTOMY/DECOMPRESSION MICRODISCECTOMY;  Surgeon: Jessy Oto, MD;  Location: [place];  Service: Orthopedics;  Laterality: Right;  RIGHT L4-5 MICRODISCECTOMY  . LUMBAR LAMINECTOMY/DECOMPRESSION MICRODISCECTOMY N/A 05/29/2015   Procedure: RIGHT L2-3 MICRODISCECTOMY;  Surgeon: Jessy Oto, MD;  Location: [place];  Service: Orthopedics;  Laterality: N/A;  . TOTAL ABDOMINAL HYSTERECTOMY  1975   partial  . UPPER GASTROINTESTINAL ENDOSCOPY     Review of Systems    10 point systems review negative except as above.    Objective:   Physical Exam 21\  BP 130/72   Pulse 76   Temp 97.7 F (36.5 C)   Ht 5' 1.5" (1.562 m)   Wt 172 lb 12.8 oz (78.4 kg)   SpO2 95%   BMI 32.12 kg/m   HEENT - WNL. Neck - supple.  Chest - Kyphotic with increased AP diameter. Clear equal BS. Cor - Nl HS. RRR w/o sig MGR. PP 1(+). No edema. MS- FROM w/o deformities.  Gait Nl. Neuro -  Nl w/o focal abnormalities. Skin - there is a 10 x 12 mm pink raised firm lesion with irregular margins of dorsal Rt hand.   Procedure (CPT -P9898346)     After informed consent and aseptic prep with alcohol and local  anesthesia with 2 ml of Marcaine 0.5% - both intradermal & sq, the lesion of the dorsal Rt hand was sharply excised full thickness in an eliptical fashion to the  deep sub-cut with a #10 scalpel. Then the wound edges were approximated with # 5 vertical mattress sutures of Nylon 3-0 and #3 interrupted sutures of Nylon 3-0 to evert the wound edges. Wound was cleansed with surgical soap and sterile dressing was applied. Patient & husband instructed in Post-op wound care.  Advised ROV in 9-10 days for wound check.      Assessment & Plan:   1. Essential hypertension  2. Squamous cell cancer of skin of right hand  3. Neoplasm of uncertain behavior of skin  - Surgical pathology

## 2019-07-31 ENCOUNTER — Ambulatory Visit (INDEPENDENT_AMBULATORY_CARE_PROVIDER_SITE_OTHER): Payer: PPO | Admitting: Internal Medicine

## 2019-07-31 ENCOUNTER — Encounter: Payer: Self-pay | Admitting: Internal Medicine

## 2019-07-31 ENCOUNTER — Other Ambulatory Visit: Payer: Self-pay

## 2019-07-31 VITALS — BP 130/72 | HR 76 | Temp 97.7°F | Ht 61.5 in | Wt 172.8 lb

## 2019-07-31 DIAGNOSIS — D485 Neoplasm of uncertain behavior of skin: Secondary | ICD-10-CM | POA: Diagnosis not present

## 2019-07-31 DIAGNOSIS — I10 Essential (primary) hypertension: Secondary | ICD-10-CM | POA: Diagnosis not present

## 2019-07-31 DIAGNOSIS — C44622 Squamous cell carcinoma of skin of right upper limb, including shoulder: Secondary | ICD-10-CM | POA: Diagnosis not present

## 2019-08-01 ENCOUNTER — Other Ambulatory Visit: Payer: Self-pay | Admitting: Internal Medicine

## 2019-08-01 ENCOUNTER — Telehealth: Payer: Self-pay | Admitting: *Deleted

## 2019-08-01 DIAGNOSIS — C44622 Squamous cell carcinoma of skin of right upper limb, including shoulder: Secondary | ICD-10-CM | POA: Diagnosis not present

## 2019-08-01 NOTE — Telephone Encounter (Signed)
Patient called and asked if she can remove the wrap around her wrist, from the lesion removal on 07/31/2019.  Per Dr Melford Aase, it is OK to remove the wrap today.  Patient states her hand is swollen at the removal site and was advised to apply an ice pack and call back as needed.

## 2019-08-05 NOTE — Progress Notes (Deleted)
Assessment and Plan:  There are no diagnoses linked to this encounter.    Further disposition pending results of labs. Discussed med's effects and SE's.   Over 30 minutes of exam, counseling, chart review, and critical decision making was performed.   Future Appointments  Date Time Provider [place]  08/06/2019 11:30 AM [name] Sierras, NP [facility] None  08/20/2019 10:15 AM [name] Merino, MD [facility] None  10/16/2019 10:00 AM [name], MD [facility] None  07/21/2020 10:00 AM [name] Comber, NP [facility] None    ------------------------------------------------------------------------------------------------------------------   HPI 84 y.o.female presents for  Past Medical History:  Diagnosis Date  . Anxiety    takes Xanax daily as needed  . Asthma    Albuterol daily as needed  . Blood transfusion without reported diagnosis   . CAD (coronary artery disease)    LHC 2003 with 40% pLAD, 30% mLAD, 100% D1 (moderate vessel), 100%  D2 (small vessel), 95% mid small OM2.  Pt had PTCA to D1;  D2 and OM2 small vessels and tx medically;  Last Myoview (2012): anterior infarct seen with scar, no ischemia. EF normal. Med Rx // Myoview (12/14):  Low risk; ant scar w/ peri-infarct ischemia; EF 55% with ant AK // Myoview 5/16: EF 55-65, ant, apical scar, no ischemia   . Cataract   . Chronic back pain    HNP, DDD, spinal stenosis, vertebral compression fractures.   . Chronic nausea    takes Zofran daily as needed.  normal gastric emptying study 03/2013  . CKD (chronic kidney disease), stage III 09/27/2017  . Constipation    felt to be functional. takes Senokot and Miralax daily.  treated with Linzess in past.   . DIVERTICULOSIS, COLON 08/28/2008   Qualifier: Diagnosis of  By: [name], [name], [name]    . Elevated LFTs 2015   probably med induced DILI, from Augmentin vs Pravastatin  . GERD (gastroesophageal reflux disease)    hx of esophageal stricture   . Hiatal hernia    Paraesophageal hernias. s/p fundoplications in 0000000 and 04/2013  . History of bronchitis several of yrs ago  . History of colon polyps 03/2009, 03/2011   adenomatous colon polyps, no high grade dysplasia.   [name] History of vertigo    no meds  . Hyperlipidemia    takes Pravastatin daily  . Hypertension    takes Amlodipine,Metoprolol,and Losartan daily  . Nausea 03/2016  . Osteoporosis   . PONV (postoperative nausea and vomiting)    yrs ago  . Slow urinary stream    takes Rapaflo daily  . TIA (transient ischemic attack) 1995   no residual effects noted     Allergies  Allergen Reactions  . Ace Inhibitors Cough    Per Dr Melvyn Novas pulmonology 2013  . Aspartame Diarrhea and Nausea And Vomiting  . Atorvastatin Other (See Comments)    Muscle aches  . Biaxin [Clarithromycin] Other (See Comments)    Unknown reaction : PATIENT CAN TOLERATE Z-PAK.  Is written on patient's paper chart.  [name] [Roflumilast] Nausea And Vomiting  . Erythromycin Other (See Comments)    Unknown reaction: PATIENT CAN TOLERATE Z-PAK.  It is written on patient's paper chart.  . Levofloxacin Nausea And Vomiting    Current Outpatient Medications on File Prior to Visit  Medication Sig  . acetaminophen (TYLENOL) 500 MG tablet Take 1 tablet (500 mg total) by mouth every 6 (six) hours as needed.  . ALPRAZolam (XANAX) 0.5 MG tablet Take 1/2 tablet 1- 2 x /day  ONLY if needed for Anxiety Attack &  limit to 5 days /week to avoid Addiction - DO NOT take with Hydrocodone or Codeine (Patient taking differently: Take 0.25-0.5 mg by mouth daily as needed for anxiety. Take 1/2 tablet 1- 2 x /day ONLY if needed for Anxiety Attack &  limit to 5 days /week to avoid Addiction - DO NOT take with Hydrocodone or Codeine)  . amLODipine (NORVASC) 10 MG tablet Take 1 tablet Daily for BP & Heart  . ascorbic acid (VITAMIN C) 500 MG tablet Take 500 mg by mouth 2 (two) times daily.  [name] aspirin EC 81 MG tablet Take 81 mg by mouth at bedtime.   [name]  BREO ELLIPTA 100-25 MCG/INH AEPB INHALE 1 PUFF BY MOUTH ONCE DAILY. RINSE MOUTH WITH WATER AFTER EACH USE.  [name] Cholecalciferol (VITAMIN D3) 5000 units CAPS Take 5,000 Units by mouth 2 (two) times daily.   [name] Coral Calcium 1000 (390 Ca) MG TABS Take 1,000 mg by mouth daily.  [name] denosumab (PROLIA) 60 MG/ML SOSY injection Inject 60 mg into the skin every 6 (six) months.  . diclofenac sodium (VOLTAREN) 1 % GEL Apply 2 g topically 4 (four) times daily. (Patient taking differently: Apply 2 g topically 4 (four) times daily as needed (pain). )  . DULoxetine (CYMBALTA) 30 MG capsule Start taking 1 cap daily, for mood and chronic pain; increase to 2 caps in 1 month if tolerating well.  . gabapentin (NEURONTIN) 800 MG tablet Take 1/2 to 1 tablet 3 x /day for Pain (Patient taking differently: Take 400 mg by mouth in the morning, at noon, in the evening, and at bedtime. 830, 1230, 430, 830p)  . ipratropium-albuterol (DUONEB) 0.5-2.5 (3) MG/3ML SOLN Take 3 mLs by nebulization every 6 (six) hours as needed (shortness of breath/wheezing).   [name] ketoconazole (NIZORAL) 2 % cream  APPLY ONE APPLICATION TOPICALLY TWO TIMES DAILY (Patient taking differently: Apply 1 application topically 2 (two) times daily as needed for irritation. )  . linaclotide (LINZESS) 145 MCG CAPS capsule Take 1 capsule (145 mcg total) by mouth daily before breakfast.  . Magnesium 400 MG TABS Take 400 mg by mouth 2 (two) times daily.   . meclizine (ANTIVERT) 25 MG tablet Take 1/2 to 1 tablet 2 to 3 x /day ONLY if needed  For Dizziness / Vertigo (Patient taking differently: Take 12.5-25 mg by mouth 3 (three) times daily as needed for dizziness. Take 1/2 to 1 tablet 2 to 3 x /day ONLY if needed  For Dizziness / Vertigo)  . metoprolol tartrate (LOPRESSOR) 50 MG tablet Take 1 tablet 2 x /day for BP & Heart (Patient taking differently: Take 50 mg by mouth 2 (two) times daily. Take 1 tablet 2 x /day for BP & Heart)  . mirabegron ER (MYRBETRIQ) 25 MG TB24  tablet Take 25 mg by mouth daily.  . Multiple Vitamins-Minerals (MULTIVITAMIN WITH MINERALS) tablet Take 1 tablet by mouth daily.  . nitroGLYCERIN (NITROSTAT) 0.4 MG SL tablet Place 1 tablet (0.4 mg total) under the tongue every 5 (five) minutes as needed for chest pain.  [name] omeprazole (PRILOSEC) 40 MG capsule Take 1 capsule 2 x /day for Indigestion , Heartburn & Nausea (Patient taking differently: Take 40 mg by mouth 2 (two) times daily. Take 1 capsule 2 x /day for Indigestion , Heartburn & Nausea)  . polyethylene glycol powder (GLYCOLAX/MIRALAX) powder Take 17 g by mouth 2 (two) times daily. (Patient taking differently: Take 17 g by mouth as needed. )  .  pravastatin (PRAVACHOL) 40 MG tablet Take 40 mg by mouth daily.  . promethazine (PHENERGAN) 25 MG tablet Take 1 tablet every 4 hours if needed for Nausea or Vomitting  . senna-docusate (SENOKOT-S) 8.6-50 MG tablet Take 2 tablets by mouth 2 (two) times daily.  . sucralfate (CARAFATE) 1 g tablet Take 1 tablet 2 to 4 x /day before meals for Heartburn  . vitamin B-12 (CYANOCOBALAMIN) 1000 MCG tablet Take 1,000 mcg by mouth daily.  [name] zinc gluconate 50 MG tablet Take 50 mg by mouth daily.   Current Facility-Administered Medications on File Prior to Visit  Medication  . calcitonin (salmon) (MIACALCIN/FORTICAL) nasal spray 1 spray    ROS: all negative except above.   Physical Exam:  There were no vitals taken for this visit.  General Appearance: Well nourished, in no apparent distress. Eyes: PERRLA, EOMs, conjunctiva no swelling or erythema Sinuses: No Frontal/maxillary tenderness ENT/Mouth: Ext aud canals clear, TMs without erythema, bulging. No erythema, swelling, or exudate on post pharynx.  Tonsils not swollen or erythematous. Hearing normal.  Neck: Supple, thyroid normal.  Respiratory: Respiratory effort normal, BS equal bilaterally without rales, rhonchi, wheezing or stridor.  Cardio: RRR with no MRGs. Brisk peripheral pulses without  edema.  Abdomen: Soft, + BS.  Non tender, no guarding, rebound, hernias, masses. Lymphatics: Non tender without lymphadenopathy.  Musculoskeletal: Full ROM, 5/5 strength, normal gait.  Skin: Warm, dry without rashes, lesions, ecchymosis.  Neuro: Cranial nerves intact. Normal muscle tone, no cerebellar symptoms. Sensation intact.  Psych: Awake and oriented X 3, normal affect, Insight and Judgment appropriate.    Pelvic: External genitalia:  no lesions              Urethra:  normal appearing urethra with no masses, tenderness or lesions              Bartholin's and Skene's: normal                 Vagina: normal appearing vagina with normal color and discharge, no lesions, mild cystocele grade 1-2 noted              Cervix: multiparous appearance, no cervical motion tenderness and no lesions              Pap taken: No  Bimanual Exam:  Uterus:  normal size, contour, position, consistency, mobility, non-tender              Adnexa: normal adnexa and no mass, fullness, tenderness               Rectovaginal: Confirms               Anus:  normal sphincter tone, no lesions, no hemorrhoids noted   [name] Sierras, NP 10:22 PM [facility]

## 2019-08-06 ENCOUNTER — Other Ambulatory Visit: Payer: Self-pay

## 2019-08-06 ENCOUNTER — Ambulatory Visit: Payer: PPO | Admitting: Adult Health Nurse Practitioner

## 2019-08-06 ENCOUNTER — Ambulatory Visit: Payer: PPO | Admitting: Internal Medicine

## 2019-08-06 VITALS — BP 104/70 | HR 72 | Temp 97.2°F | Resp 16 | Ht 62.5 in | Wt 168.2 lb

## 2019-08-06 DIAGNOSIS — D485 Neoplasm of uncertain behavior of skin: Secondary | ICD-10-CM

## 2019-08-06 MED ORDER — DICYCLOMINE HCL 20 MG PO TABS: ORAL_TABLET | ORAL | 0 refills | Status: DC

## 2019-08-06 NOTE — Progress Notes (Signed)
   Subjective:    Patient ID: [name] [name], female    DOB: Sep 30, 1935, 84 y.o.   MRN: [id number]  HPI  Patient presents for wound check s/p excisional bx of skin lesion dorsal Lt hand  Pah report still pending  Review of Systems   10 point systems review negative except as above.      Objective:   Physical Exam  BP 104/70   Pulse 72   Temp (!) 97.2 F (36.2 C)   Resp 16   Ht 5' 2.5" (1.588 m)   Wt 168 lb 3.2 oz (76.3 kg)   BMI 30.27 kg/m   Wound appears healing w/o sign of infection. Wound cleansed with surgical soap, dried & sterile Bandages applied.      Assessment & Plan:   Neoplasm  [facility] significance - path pending  - ROV 4-5 days anticipate suture removal

## 2019-08-10 ENCOUNTER — Other Ambulatory Visit: Payer: Self-pay | Admitting: Internal Medicine

## 2019-08-10 DIAGNOSIS — Z79899 Other long term (current) drug therapy: Secondary | ICD-10-CM

## 2019-08-10 DIAGNOSIS — K219 Gastro-esophageal reflux disease without esophagitis: Secondary | ICD-10-CM

## 2019-08-12 NOTE — Progress Notes (Signed)
       Patient is a very nice 84 yo MWF returns for suture removal s/p excision of a squamous cell skin cancer of her dorsal Rt hand on 3.24.2021.   Wound well healed and sutures removed w/o difficulty  Dx  1. Squamous cell cancer of skin of right hand

## 2019-08-13 ENCOUNTER — Ambulatory Visit: Payer: PPO | Admitting: Internal Medicine

## 2019-08-13 ENCOUNTER — Other Ambulatory Visit: Payer: Self-pay

## 2019-08-13 VITALS — BP 116/72 | HR 64 | Temp 97.4°F | Resp 16 | Ht 61.5 in | Wt 168.6 lb

## 2019-08-13 DIAGNOSIS — C44622 Squamous cell carcinoma of skin of right upper limb, including shoulder: Secondary | ICD-10-CM

## 2019-08-14 NOTE — Progress Notes (Signed)
Office Visit Note  Patient: [name] [name]             Date of Birth: 07-Oct-1935           MRN: [id number]             PCP: [name], MD Referring: [name], MD Visit Date: 08/20/2019 Occupation: @GUAROCC @  Subjective:  Medication monitoring.   History of Present Illness: [name] [name] is a 84 y.o. female with history of osteoporosis and osteoarthritis.  She states she has not had any vertebral fractures in a while.  She has been tolerating Prolia well.  She continues to have some stiffness and pain in her joints due to underlying osteoarthritis.  She denies any joint swelling. She is due for prolia 4/30.  Activities of Daily Living:  Patient reports morning stiffness for 0 minutes.   Patient Denies nocturnal pain.  Difficulty dressing/grooming: Denies Difficulty climbing stairs: Denies Difficulty getting out of chair: Denies Difficulty using hands for taps, buttons, cutlery, and/or writing: Denies  Review of Systems  Constitutional: Positive for fatigue. Negative for night sweats, weight gain and weight loss.  HENT: Negative for mouth sores, trouble swallowing, trouble swallowing, mouth dryness and nose dryness.   Eyes: Negative for pain, redness, itching, visual disturbance and dryness.  Respiratory: Negative for cough, shortness of breath and difficulty breathing.   Cardiovascular: Negative for chest pain, palpitations, hypertension, irregular heartbeat and swelling in legs/feet.  Gastrointestinal: Positive for constipation. Negative for blood in stool and diarrhea.  Endocrine: Negative for increased urination.  Genitourinary: Negative for difficulty urinating, painful urination and vaginal dryness.  Musculoskeletal: Positive for arthralgias and joint pain. Negative for joint swelling, myalgias, muscle weakness, morning stiffness, muscle tenderness and myalgias.  Skin: Negative for color change, rash, hair loss, redness, skin tightness, ulcers and sensitivity  to sunlight.  Allergic/Immunologic: Negative for susceptible to infections.  Neurological: Negative for dizziness, headaches, memory loss, night sweats and weakness.  Hematological: Negative for bruising/bleeding tendency and swollen glands.  Psychiatric/Behavioral: Negative for depressed mood, confusion and sleep disturbance. The patient is not nervous/anxious.     PMFS History:  Patient Active Problem List   Diagnosis Date Noted  . Senile purpura ([place]) 07/05/2019  . Atherosclerosis of autologous vein coronary artery bypass graft with angina pectoris ([place]) 07/05/2019  . Abnormal glucose 03/01/2018  . FHx: heart disease 03/01/2018  . Therapeutic opioid-induced constipation ([facility]IC) 12/21/2017  . Abnormal UGI series 12/21/2017  . CKD (chronic kidney disease) stage 2, GFR 60-89 ml/min 09/27/2017  . T12 compression fracture ([place]) 09/26/2017  . Chronic bronchitis ([place]) 08/09/2017  . Atherosclerosis of aorta ([place]) 04/05/2017  . Anxiety state 06/08/2016  . Spinal stenosis, lumbar region, with neurogenic claudication 05/29/2015  . DDD (degenerative disc disease), lumbar 04/21/2015  . Hyperlipidemia, mixed 11/24/2014  . Gastroesophageal reflux disease 11/24/2014  . Depression, major, in partial remission ([place]) 09/26/2014  . Chronic pain syndrome 09/26/2014  . Coronary artery disease due to lipid rich plaque   . Osteoporosis 02/10/2014  . Prediabetes 08/14/2013  . Vitamin D deficiency 08/14/2013  . Chronic constipation 04/03/2013  . MGUS (monoclonal gammopathy of unknown significance) 03/05/2013  . Vertebral compression fracture ([place]) 06/23/2012  . ESOPHAGEAL STRICTURE 08/28/2008  . Mild intermittent asthma without complication [id number]  . Incarcerated recurrent paraesophageal hiatal hernia s/p lap redo repair [id number] 01/31/2008  . Essential hypertension 07/25/2007    Past Medical History:  Diagnosis Date  . Anxiety    takes Xanax daily as needed  .  Asthma    Albuterol daily as  needed  . Blood transfusion without reported diagnosis   . CAD (coronary artery disease)    LHC 2003 with 40% pLAD, 30% mLAD, 100% D1 (moderate vessel), 100%  D2 (small vessel), 95% mid small OM2.  Pt had PTCA to D1;  D2 and OM2 small vessels and tx medically;  Last Myoview (2012): anterior infarct seen with scar, no ischemia. EF normal. Med Rx // Myoview (12/14):  Low risk; ant scar w/ peri-infarct ischemia; EF 55% with ant AK // Myoview 5/16: EF 55-65, ant, apical scar, no ischemia   . Cataract   . Chronic back pain    HNP, DDD, spinal stenosis, vertebral compression fractures.   . Chronic nausea    takes Zofran daily as needed.  normal gastric emptying study 03/2013  . CKD (chronic kidney disease), stage III 09/27/2017  . Constipation    felt to be functional. takes Senokot and Miralax daily.  treated with Linzess in past.   . DIVERTICULOSIS, COLON 08/28/2008   Qualifier: Diagnosis of  By: [name], [name], [name]    . Elevated LFTs 2015   probably med induced DILI, from Augmentin vs Pravastatin  . GERD (gastroesophageal reflux disease)    hx of esophageal stricture   . Hiatal hernia    Paraesophageal hernias. s/p fundoplications in 0000000 and 04/2013  . History of bronchitis several of yrs ago  . History of colon polyps 03/2009, 03/2011   adenomatous colon polyps, no high grade dysplasia.   [name] History of vertigo    no meds  . Hyperlipidemia    takes Pravastatin daily  . Hypertension    takes Amlodipine,Metoprolol,and Losartan daily  . Nausea 03/2016  . Osteoporosis   . PONV (postoperative nausea and vomiting)    yrs ago  . Slow urinary stream    takes Rapaflo daily  . TIA (transient ischemic attack) 1995   no residual effects noted    Family History  Problem Relation Age of Onset  . Colon cancer Mother 74  . Heart attack Father 14       heart attack  . Brain cancer Brother        tumor   . Heart disease Brother   . Heart disease Brother   . Kidney disease Daughter   .  Esophageal cancer Neg Hx   . Stomach cancer Neg Hx    Past Surgical History:  Procedure Laterality Date  . APPENDECTOMY     patient unsure of date  . BACK SURGERY    . BLADDER REPAIR    . CHOLECYSTECTOMY     Patient unsure of date.  . CORONARY ANGIOPLASTY  11/16/2001   1 stent  . ESOPHAGEAL MANOMETRY N/A 03/25/2013   Procedure: ESOPHAGEAL MANOMETRY (EM);  Surgeon: Inda Castle, MD;  Location: [facility] END[facility]COPY;  Service: Endoscopy;  Laterality: N/A;  . ESOPHAGOGASTRODUODENOSCOPY N/A 03/15/2013   Procedure: ESOPHAGOGASTRODUODENOSCOPY (EGD);  Surgeon: Jerene Bears, MD;  Location: [facility] ENDOSCOPY;  Service: Endoscopy;  Laterality: N/A;  . ESOPHAGOGASTRODUODENOSCOPY N/A 12/25/2015   Procedure: ESOPHAGOGASTRODUODENOSCOPY (EGD);  Surgeon: Doran Stabler, MD;  Location[facility];  Service: Endoscopy;  Laterality: N/A;  . EYE SURGERY  11/2000   bilateral cataracts with lens implant  . FIXATION KYPHOPLASTY LUMBAR SPINE  X 2   "L3-4"  . HEMORRHOID SURGERY    . HIATAL HERNIA REPAIR  06/03/2011   Procedure: LAPAROSCOPIC REPAIR OF HIATAL HERNIA;  Surgeon: Adin Hector, MD;  Location: [facility];  Service: General;  Laterality: N/A;  . HIATAL HERNIA REPAIR N/A 04/24/2013   Procedure: LAPAROSCOPIC  REPAIR RECURRENT PARASOPHAGEAL HIATAL HERNIA WITH FUNDOPLICATION;  Surgeon: Adin Hector, MD;  Location: [facility] ORS;  Service: General;  Laterality: N/A;  . INSERTION OF MESH N/A 04/24/2013   Procedure: INSERTION OF MESH;  Surgeon: Adin Hector, MD;  Location: [facility] ORS;  Service: General;  Laterality: N/A;  . IR KYPHO THORACIC WITH BONE BIOPSY  09/29/2017  . IR KYPHO THORACIC WITH BONE BIOPSY  11/03/2017  . IR KYPHO THORACIC WITH BONE BIOPSY  02/14/2019  . IR KYPHO THORACIC WITH BONE BIOPSY  04/02/2019  . LAPAROSCOPIC LYSIS OF ADHESIONS N/A 04/24/2013   Procedure: LAPAROSCOPIC LYSIS OF ADHESIONS;  Surgeon: Adin Hector, MD;  Location: [facility] ORS;  Service: General;  Laterality: N/A;  . LAPAROSCOPIC NISSEN  FUNDOPLICATION  [id number]   Procedure: LAPAROSCOPIC NISSEN FUNDOPLICATION;  Surgeon: Adin Hector, MD;  Location: [facility] ORS;  Service: General;  Laterality: N/A;  . LUMBAR FUSION  12/07/2015   Right L2-3 facetectomy, posterolateral fusion L2-3 with pedicle screws, rods, local bone graft, Vivigen cancellous chips. Fusion extended to the L1 level with pedicle screws and rods  . LUMBAR LAMINECTOMY/DECOMPRESSION MICRODISCECTOMY Right 06/26/2012   Procedure: LUMBAR LAMINECTOMY/DECOMPRESSION MICRODISCECTOMY;  Surgeon: Jessy Oto, MD;  Location: [place];  Service: Orthopedics;  Laterality: Right;  RIGHT L4-5 MICRODISCECTOMY  . LUMBAR LAMINECTOMY/DECOMPRESSION MICRODISCECTOMY N/A 05/29/2015   Procedure: RIGHT L2-3 MICRODISCECTOMY;  Surgeon: Jessy Oto, MD;  Location: [place];  Service: Orthopedics;  Laterality: N/A;  . TOTAL ABDOMINAL HYSTERECTOMY  1975   partial  . UPPER GASTROINTESTINAL ENDOSCOPY     Social History   Social History Narrative   Lives with husband.   Right-handed.   3 cups caffeine per day.   Immunization History  Administered Date(s) Administered  . DT (Pediatric) 05/06/2014  . Influenza Split 02/07/2011  . Influenza Whole 02/09/2009, 01/19/2010, 02/13/2012  . Influenza, High Dose Seasonal PF 03/22/2013, 01/15/2014, 02/27/2015, 01/14/2016, 01/18/2017, 02/14/2018, 02/14/2019  . PFIZER SARS-COV-2 Vaccination 05/25/2019, 06/15/2019  . Pneumococcal Conjugate-13 05/06/2014  . Pneumococcal Polysaccharide-23 02/09/2011  . Pneumococcal-Unspecified 02/06/2010, 02/09/2012  . Td 05/10/2003     Objective: Vital Signs: BP 131/78 (BP Location: Left Arm, Patient Position: Sitting, Cuff Size: Normal)   Pulse 60   Resp 16   Ht 5\' 2"  (1.575 m)   Wt 170 lb 12.8 oz (77.5 kg)   BMI 31.24 kg/m    Physical Exam Vitals and nursing note reviewed.  Constitutional:      Appearance: She is well-developed.  HENT:     Head: Normocephalic and atraumatic.  Eyes:     Conjunctiva/sclera:  Conjunctivae normal.  Cardiovascular:     Rate and Rhythm: Normal rate and regular rhythm.     Heart sounds: Normal heart sounds.  Pulmonary:     Effort: Pulmonary effort is normal.     Breath sounds: Normal breath sounds.  Abdominal:     General: Bowel sounds are normal.     Palpations: Abdomen is soft.  Musculoskeletal:     Cervical back: Normal range of motion.  Lymphadenopathy:     Cervical: No cervical adenopathy.  Skin:    General: Skin is warm and dry.     Capillary Refill: Capillary refill takes less than 2 seconds.  Neurological:     Mental Status: She is alert and oriented to person, place, and time.  Psychiatric:        Behavior:  Behavior normal.      Musculoskeletal Exam: Patient has limited range of motion of her cervical spine.  She has thoracic kyphosis.  She has lost lumbar lordosis.  She had no point tenderness over her spine.  Shoulder joints, elbow joints with good range of motion.  She has PIP and DIP thickening in her hands but no synovitis.  She has good range of motion in her knee joints without any warmth swelling or effusion.  CDAI Exam: CDAI Score: -- Patient Global: --; Provider Global: -- Swollen: --; Tender: -- Joint Exam 08/20/2019   No joint exam has been documented for this visit   There is currently no information documented on the homunculus. Go to the Rheumatology activity and complete the homunculus joint exam.  Investigation: No additional findings.  Imaging: No results found.  Recent Labs: Lab Results  Component Value Date   WBC 8.9 07/05/2019   HGB 14.9 07/05/2019   PLT 350 07/05/2019   NA 138 07/05/2019   K 4.3 07/05/2019   CL 102 07/05/2019   CO2 26 07/05/2019   GLUCOSE 97 07/05/2019   BUN 17 07/05/2019   CREATININE 0.75 07/05/2019   BILITOT 0.5 07/05/2019   ALKPHOS 54 06/10/2019   AST 13 07/05/2019   ALT 12 07/05/2019   PROT 6.7 07/05/2019   ALBUMIN 4.0 06/10/2019   CALCIUM 9.8 07/05/2019   GFRAA 87 07/05/2019     Speciality Comments: No specialty comments available.  Procedures:  No procedures performed Allergies: Ace inhibitors, Aspartame, Atorvastatin, Biaxin [clarithromycin], Daliresp [roflumilast], Erythromycin, and Levofloxacin   Assessment / Plan:     Visit Diagnoses: Age-related osteoporosis without current pathological fracture - DEXA 11/12/18: The BMD measured at Femur Neck Left is 0.657 g/cm2 with a T-score of -2.7. Prolia q37months.  Her next Prolia injection is due on April 30.  She had labs recently by her PCP.  History of vitamin D deficiency-patient is on vitamin D supplement.  History of vertebral compression fracture  H/O kyphoplasty  Primary osteoarthritis of both knees-she is currently not having much discomfort.  No warmth swelling or effusion was noted.  DDD (degenerative disc disease), lumbar-she has chronic pain.  She is using a back brace currently.  Facet arthropathy  Other medical problems are listed as follows:  History of gastroesophageal reflux (GERD)  History of hypercholesterolemia  History of asthma  History of appendectomy  History of cholecystectomy  History of hypertension  History of neuropathy  History of anxiety   Orders: No orders of the defined types were placed in this encounter.  No orders of the defined types were placed in this encounter.     Follow-Up Instructions: Return in about 6 months (around 02/19/2020) for Osteoarthritis, Osteoporosis.   [name], MD  Note - This record has been created using Editor, commissioning.  Chart creation errors have been sought, but may not always  have been located. Such creation errors do not reflect on  the standard of medical care.

## 2019-08-19 ENCOUNTER — Encounter: Payer: Self-pay | Admitting: Pharmacist

## 2019-08-19 DIAGNOSIS — M961 Postlaminectomy syndrome, not elsewhere classified: Secondary | ICD-10-CM | POA: Diagnosis not present

## 2019-08-19 DIAGNOSIS — G894 Chronic pain syndrome: Secondary | ICD-10-CM | POA: Diagnosis not present

## 2019-08-19 DIAGNOSIS — M150 Primary generalized (osteo)arthritis: Secondary | ICD-10-CM | POA: Diagnosis not present

## 2019-08-19 DIAGNOSIS — M4726 Other spondylosis with radiculopathy, lumbar region: Secondary | ICD-10-CM | POA: Diagnosis not present

## 2019-08-19 NOTE — Telephone Encounter (Signed)
Opened in error

## 2019-08-20 ENCOUNTER — Ambulatory Visit: Payer: PPO | Admitting: Rheumatology

## 2019-08-20 ENCOUNTER — Encounter: Payer: Self-pay | Admitting: Rheumatology

## 2019-08-20 ENCOUNTER — Other Ambulatory Visit: Payer: Self-pay

## 2019-08-20 VITALS — BP 131/78 | HR 60 | Resp 16 | Ht 62.0 in | Wt 170.8 lb

## 2019-08-20 DIAGNOSIS — Z9889 Other specified postprocedural states: Secondary | ICD-10-CM | POA: Diagnosis not present

## 2019-08-20 DIAGNOSIS — Z8679 Personal history of other diseases of the circulatory system: Secondary | ICD-10-CM | POA: Diagnosis not present

## 2019-08-20 DIAGNOSIS — Z5181 Encounter for therapeutic drug level monitoring: Secondary | ICD-10-CM

## 2019-08-20 DIAGNOSIS — M810 Age-related osteoporosis without current pathological fracture: Secondary | ICD-10-CM

## 2019-08-20 DIAGNOSIS — Z9049 Acquired absence of other specified parts of digestive tract: Secondary | ICD-10-CM | POA: Diagnosis not present

## 2019-08-20 DIAGNOSIS — M47819 Spondylosis without myelopathy or radiculopathy, site unspecified: Secondary | ICD-10-CM | POA: Diagnosis not present

## 2019-08-20 DIAGNOSIS — Z8719 Personal history of other diseases of the digestive system: Secondary | ICD-10-CM

## 2019-08-20 DIAGNOSIS — Z8709 Personal history of other diseases of the respiratory system: Secondary | ICD-10-CM

## 2019-08-20 DIAGNOSIS — M170 Bilateral primary osteoarthritis of knee: Secondary | ICD-10-CM

## 2019-08-20 DIAGNOSIS — Z8669 Personal history of other diseases of the nervous system and sense organs: Secondary | ICD-10-CM | POA: Diagnosis not present

## 2019-08-20 DIAGNOSIS — Z8639 Personal history of other endocrine, nutritional and metabolic disease: Secondary | ICD-10-CM | POA: Diagnosis not present

## 2019-08-20 DIAGNOSIS — Z8781 Personal history of (healed) traumatic fracture: Secondary | ICD-10-CM

## 2019-08-20 DIAGNOSIS — M5136 Other intervertebral disc degeneration, lumbar region: Secondary | ICD-10-CM

## 2019-08-20 DIAGNOSIS — Z8659 Personal history of other mental and behavioral disorders: Secondary | ICD-10-CM

## 2019-08-20 MED ORDER — DENOSUMAB 60 MG/ML ~~LOC~~ SOSY: 60.0000 mg | PREFILLED_SYRINGE | SUBCUTANEOUS | 0 refills | Status: DC

## 2019-08-20 NOTE — Addendum Note (Signed)
Addended by: Mariella Saa C on: 08/20/2019 10:46 AM   Modules accepted: Orders

## 2019-08-20 NOTE — Addendum Note (Signed)
Addended by: Mariella Saa C on: 08/20/2019 11:17 AM   Modules accepted: Orders

## 2019-09-06 ENCOUNTER — Other Ambulatory Visit: Payer: Self-pay | Admitting: Internal Medicine

## 2019-09-06 DIAGNOSIS — R112 Nausea with vomiting, unspecified: Secondary | ICD-10-CM

## 2019-09-06 DIAGNOSIS — G2581 Restless legs syndrome: Secondary | ICD-10-CM

## 2019-09-06 MED ORDER — PROCHLORPERAZINE MALEATE 10 MG PO TABS: ORAL_TABLET | ORAL | 0 refills | Status: DC

## 2019-09-06 MED ORDER — ROPINIROLE HCL 1 MG PO TABS: ORAL_TABLET | ORAL | 0 refills | Status: DC

## 2019-09-06 MED ORDER — PROMETHAZINE HCL 25 MG PO TABS: ORAL_TABLET | ORAL | 0 refills | Status: DC

## 2019-09-10 ENCOUNTER — Ambulatory Visit: Payer: PPO

## 2019-09-10 ENCOUNTER — Other Ambulatory Visit: Payer: Self-pay

## 2019-09-10 MED FILL — PROLIA 60 MG/ML SOLN: 60 | 180 days supply | Qty: 1 | Fill #0

## 2019-09-10 NOTE — Progress Notes (Deleted)
Pharmacy Note  Subjective:   Patient presents to clinic today to receive bi-annual Prolia injection.  Patient running a fever or have signs/symptoms of infection? {yes/no:20286}  Patient currently on antibiotics for the treatment of infection? {yes/no:20286}  Patient have any upcoming invasive procedures/surgeries? {yes/no:20286}  Objective: CMP     Component Value Date/Time   NA 138 07/05/2019 1125   NA 139 02/19/2013 1346   K 4.3 07/05/2019 1125   K 2.8 (LL) 02/19/2013 1346   CL 102 07/05/2019 1125   CO2 26 07/05/2019 1125   CO2 24 02/19/2013 1346   GLUCOSE 97 07/05/2019 1125   GLUCOSE 100 02/19/2013 1346   BUN 17 07/05/2019 1125   BUN 12.6 02/19/2013 1346   CREATININE 0.75 07/05/2019 1125   CREATININE 1.0 02/19/2013 1346   CALCIUM 9.8 07/05/2019 1125   CALCIUM 10.0 02/19/2013 1346   PROT 6.7 07/05/2019 1125   PROT 8.0 02/19/2013 1346   ALBUMIN 4.0 06/10/2019 0500   ALBUMIN 3.9 02/19/2013 1346   AST 13 07/05/2019 1125   AST 12 02/19/2013 1346   ALT 12 07/05/2019 1125   ALT 11 02/19/2013 1346   ALKPHOS 54 06/10/2019 0500   ALKPHOS 87 02/19/2013 1346   BILITOT 0.5 07/05/2019 1125   BILITOT 0.74 02/19/2013 1346   GFRNONAA 75 07/05/2019 1125   GFRAA 87 07/05/2019 1125    CBC    Component Value Date/Time   WBC 8.9 07/05/2019 1125   RBC 4.72 07/05/2019 1125   HGB 14.9 07/05/2019 1125   HGB 14.0 02/19/2013 1346   HCT 43.6 07/05/2019 1125   HCT 41.1 02/19/2013 1346   PLT 350 07/05/2019 1125   PLT 456 (H) 02/19/2013 1346   MCV 92.4 07/05/2019 1125   MCV 85.3 02/19/2013 1346   MCH 31.6 07/05/2019 1125   MCHC 34.2 07/05/2019 1125   RDW 12.9 07/05/2019 1125   RDW 14.1 02/19/2013 1346   LYMPHSABS 2,029 07/05/2019 1125   LYMPHSABS 2.3 02/19/2013 1346   MONOABS 0.5 05/13/2018 1806   MONOABS 0.7 02/19/2013 1346   EOSABS 329 07/05/2019 1125   EOSABS 0.2 02/19/2013 1346   BASOSABS 71 07/05/2019 1125   BASOSABS 0.0 02/19/2013 1346   Lab Results  Component Value  Date   VD25OH 89 03/21/2019   Assessment/Plan:    Patient tolerated well.  Observed for 30 mins in office for adverse reaction and ***.   Patient is to return in 10-14 days for follow up labs to monitor for hypocalcemia.  Standing orders placed.   All questions encouraged and answered.  Instructed patient to call with any further questions or concerns.  Mariella Saa, PharmD, [facility] Rheumatology Clinical Pharmacist  09/10/2019 8:21 AM

## 2019-09-11 ENCOUNTER — Telehealth: Payer: Self-pay | Admitting: Pharmacist

## 2019-09-11 NOTE — Telephone Encounter (Signed)
Called to notify patient and schedule appointment for administration. No answer and left voicemail.  She can be scheduled anytime tomorrow morning for Prolia injection.  Mariella Saa, PharmD, [place], [place] Clinical Specialty Pharmacist [phone]  09/11/2019 2:47 PM

## 2019-09-12 ENCOUNTER — Other Ambulatory Visit: Payer: Self-pay

## 2019-09-12 ENCOUNTER — Ambulatory Visit (INDEPENDENT_AMBULATORY_CARE_PROVIDER_SITE_OTHER): Payer: PPO | Admitting: *Deleted

## 2019-09-12 VITALS — BP 136/79

## 2019-09-12 DIAGNOSIS — M810 Age-related osteoporosis without current pathological fracture: Secondary | ICD-10-CM

## 2019-09-12 MED ORDER — DENOSUMAB 60 MG/ML ~~LOC~~ SOSY
60.0000 mg | PREFILLED_SYRINGE | Freq: Once | SUBCUTANEOUS | Status: AC
  Administered 2019-09-12: 60 mg via SUBCUTANEOUS

## 2019-09-12 NOTE — Progress Notes (Signed)
Patient in office for a Prolia injection. Patient given injection in left arm and tolerated injection well. Patient reminded to return to office in 10 days for labs.   Administrations This Visit    denosumab (PROLIA) injection 60 mg    Admin Date 09/12/2019 Action Given Dose 60 mg Route Subcutaneous Administered By Carole Binning, LPN

## 2019-09-12 NOTE — Patient Instructions (Signed)
Standing Labs We placed an order today for your standing lab work.    Please come back and get your standing labs in 10 days.   We have open lab daily Monday through Thursday from 8:30-12:30 PM and 1:30-4:30 PM and Friday from 8:30-12:30 PM and 1:30-4:00 PM at the office of Dr. Shaili Deveshwar.   You may experience shorter wait times on Monday and Friday afternoons. The office is located at [address], [address], [place], [place] [address] No appointment is necessary.   Labs are drawn by [facility].  You may receive a bill from [facility] for your lab work.  If you wish to have your labs drawn at another location, please call the office 24 hours in advance to send orders.  If you have any questions regarding directions or hours of operation,  please call [phone].   Just as a reminder please drink plenty of water prior to coming for your lab work. Thanks!  

## 2019-09-23 ENCOUNTER — Telehealth: Payer: Self-pay | Admitting: Gastroenterology

## 2019-09-23 MED ORDER — TRULANCE 3 MG PO TABS: 3.0000 mg | ORAL_TABLET | Freq: Every day | ORAL | 1 refills | Status: DC

## 2019-09-23 NOTE — Telephone Encounter (Signed)
Called and spoke to pt.  She reports Linzess 173mcg  has been working well for her but it is too expensive.  She has tried Actuary. Please advise what else she can try. Thank you

## 2019-09-23 NOTE — Telephone Encounter (Signed)
She may need to contact her insurance to see what else they cover. Trulance is an option if covered, we can give her that. Thanks

## 2019-09-23 NOTE — Telephone Encounter (Signed)
Sending Trulance 3 mg once daily.  Will see how well it is covered. LM for pt with script information

## 2019-09-23 NOTE — Telephone Encounter (Signed)
Pt states that copay for linzess is over $100. She would like a generic or alternative medication that is more affordable. She stated that she is no longer taking narcotics.

## 2019-09-24 MED ORDER — LUBIPROSTONE 24 MCG PO CAPS: 24.0000 ug | ORAL_CAPSULE | Freq: Two times a day (BID) | ORAL | 0 refills | Status: DC

## 2019-09-24 NOTE — Telephone Encounter (Signed)
Patient called stated the Trulance is costing her over $100.00 and she can not afford that.

## 2019-09-24 NOTE — Telephone Encounter (Signed)
Called and spoke to pt. She indicated she is not sure she has tried Amitiza and would be open to trying it if it is covered by insurance.  We will send to the pharmacy and see how well it is covered. If still too expensive will get next steps from Dr. Havery Moros.  Patient expressed understanding.

## 2019-09-24 NOTE — Addendum Note (Signed)
Addended by: Roetta Sessions on: 09/24/2019 01:45 PM   Modules accepted: Orders

## 2019-09-25 NOTE — Telephone Encounter (Signed)
That is unfortunate. Motegrity is another option but I'm assuming too expensive. She may be left with high dose Miralax - double dose twice daily and titrate up or down as needed. She can also use some dulcolax with that as well.

## 2019-09-25 NOTE — Telephone Encounter (Signed)
Dr. Havery Moros, we have sent Linzess, Trulance and Amitiza and they are all too expensive.  Please advise. Thank you

## 2019-09-25 NOTE — Telephone Encounter (Signed)
Patient called states the medication is still too expensive for her

## 2019-09-26 ENCOUNTER — Other Ambulatory Visit: Payer: Self-pay

## 2019-09-26 NOTE — Telephone Encounter (Signed)
Called and spoke to pt.  She contacted her insurance company and they said she is in the doughnut hole until she reaches $4000.  We discussed hi dose Miralax and and she expressed understanding how to titrate as needed and to add dulcolax as needed.  She promised she would call us if she has any problems managing her symptoms with this regime.

## 2019-09-26 NOTE — Addendum Note (Signed)
Addended by: Roetta Sessions on: 09/26/2019 02:10 PM   Modules accepted: Orders

## 2019-09-30 ENCOUNTER — Telehealth: Payer: Self-pay | Admitting: Rheumatology

## 2019-09-30 ENCOUNTER — Other Ambulatory Visit: Payer: Self-pay | Admitting: Internal Medicine

## 2019-09-30 MED ORDER — OXYBUTYNIN CHLORIDE ER 10 MG PO TB24: ORAL_TABLET | ORAL | 0 refills | Status: DC

## 2019-09-30 NOTE — Telephone Encounter (Signed)
Patient states she qualified for a grant for medication last year for Prolia injections. Patient just had 2nd injection, and she had to pay for last one. Patient thought grant was to cover this. Please call to advise.

## 2019-09-30 NOTE — Telephone Encounter (Signed)
Returned patient's call and advised that grant did pick up her $300 copay. Patient states that she call her insurance and they were charged for prolia injection. Explained to patient that grants pick up the copays after insurance is billed. Patient stated that she now understands, but is not happy because she is now in the donut hole. Patient thanked me for explaining and states had no other questions.

## 2019-10-09 ENCOUNTER — Other Ambulatory Visit: Payer: Self-pay | Admitting: Pharmacist

## 2019-10-09 DIAGNOSIS — Z5181 Encounter for therapeutic drug level monitoring: Secondary | ICD-10-CM

## 2019-10-09 DIAGNOSIS — M810 Age-related osteoporosis without current pathological fracture: Secondary | ICD-10-CM | POA: Diagnosis not present

## 2019-10-09 LAB — CBC WITH DIFFERENTIAL/PLATELET
Absolute Monocytes: 570 cells/uL (ref 200–950)
Basophils Absolute: 61 cells/uL (ref 0–200)
Basophils Relative: 0.8 %
Eosinophils Absolute: 319 cells/uL (ref 15–500)
Eosinophils Relative: 4.2 %
HCT: 46.3 % — ABNORMAL HIGH (ref 35.0–45.0)
Hemoglobin: 15.6 g/dL — ABNORMAL HIGH (ref 11.7–15.5)
Lymphs Abs: 1976 cells/uL (ref 850–3900)
MCH: 31.8 pg (ref 27.0–33.0)
MCHC: 33.7 g/dL (ref 32.0–36.0)
MCV: 94.5 fL (ref 80.0–100.0)
MPV: 11 fL (ref 7.5–12.5)
Monocytes Relative: 7.5 %
Neutro Abs: 4674 cells/uL (ref 1500–7800)
Neutrophils Relative %: 61.5 %
Platelets: 366 10*3/uL (ref 140–400)
RBC: 4.9 10*6/uL (ref 3.80–5.10)
RDW: 12.6 % (ref 11.0–15.0)
Total Lymphocyte: 26 %
WBC: 7.6 10*3/uL (ref 3.8–10.8)

## 2019-10-09 LAB — COMPLETE METABOLIC PANEL WITH GFR
AG Ratio: 1.6 (calc) (ref 1.0–2.5)
ALT: 13 U/L (ref 6–29)
AST: 15 U/L (ref 10–35)
Albumin: 4.2 g/dL (ref 3.6–5.1)
Alkaline phosphatase (APISO): 55 U/L (ref 37–153)
BUN: 13 mg/dL (ref 7–25)
CO2: 26 mmol/L (ref 20–32)
Calcium: 10.2 mg/dL (ref 8.6–10.4)
Chloride: 105 mmol/L (ref 98–110)
Creat: 0.83 mg/dL (ref 0.60–0.88)
GFR, Est African American: 77 mL/min/{1.73_m2} (ref >=60)
GFR, Est Non African American: 66 mL/min/{1.73_m2} (ref >=60)
Globulin: 2.6 g/dL (calc) (ref 1.9–3.7)
Glucose, Bld: 101 mg/dL — ABNORMAL HIGH (ref 65–99)
Potassium: 4.5 mmol/L (ref 3.5–5.3)
Sodium: 142 mmol/L (ref 135–146)
Total Bilirubin: 0.5 mg/dL (ref 0.2–1.2)
Total Protein: 6.8 g/dL (ref 6.1–8.1)

## 2019-10-09 NOTE — Progress Notes (Signed)
Patient presented for labs post-Prolia injeciton.  CMP and CBC placed.   Mariella Saa, PharmD, [place], [facility] Clinical Specialty Pharmacist (Rheumatology and Pulmonology)  10/09/2019 11:48 AM

## 2019-10-10 NOTE — Progress Notes (Signed)
Labs are stable.

## 2019-10-14 NOTE — Progress Notes (Signed)
CPE AND FOLLOW UP  Assessment:   CPE 1 year   Atherosclerosis of aorta ([place])- via CT chest -     EKG 12-Lead -     [place], RETROPERITNL ABD,  LTD Control blood pressure, cholesterol, glucose, increase exercise.   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   Senile purpura (HCC) Discussed process, protect skin, sunscreen  Restless leg -     Iron,Total/Total Iron Binding Cap -     Ferritin -     Vitamin B12 - increase the requip to 2-4 mg a day- take 1-2 hours before symptoms  B12 deficiency -     Vitamin B12  Anemia, unspecified type -     Iron,Total/Total Iron Binding Cap -     Ferritin  Essential hypertension - continue medications, DASH diet, exercise and monitor at home. Call if greater than 130/80.  - CBC with Differential/Platelet - CMP/GFR - TSH  Abnormal glucose Discussed general issues about diabetes pathophysiology and management., Educational material distributed., Suggested low cholesterol diet., Encouraged aerobic exercise., Discussed foot care., Reminded to get yearly retinal exam. - Hemoglobin A1c q13m   Mixed hyperlipidemia -continue medications, check lipids, decrease fatty foods, increase activity.  - Lipid panel  Recurrent major depressive disorder, in partial remission (HCC) Partial remission, continue meds  Incarcerated recurrent paraesophageal hiatal hernia s/p lap redo repair [id number] Improved, continue PPI   Asthma, unspecified asthma severity, uncomplicated Continue inhalers- may try adviar for cost but if still expensive she may benefit from nebulizer Go to the ER if any chest pain, shortness of breath, nausea, dizziness, severe HA, changes vision/speech  Chronic bronchitis (HCC) Continue inhalers, follows with pulm  Vertebral compression fracture, sequela Continue prolia, continue Vit D and Ca, weight bearing  exercises  Osteoporosis Continue proliea, continue Vit D and Ca, weight bearing exercises   Herniated lumbar intervertebral disc Continue pain management  Vitamin D deficiency - Vit D  25 hydroxy (rtn osteoporosis monitoring)  MGUS (monoclonal gammopathy of unknown significance) - CBC with Differential/Platelet  Medication management - Magnesium  Chronic pain syndrome Continue pain management   Anxiety continue medications, stress management techniques discussed, increase water, good sleep hygiene discussed, increase exercise, and increase veggies.    Gastroesophageal reflux disease, esophagitis presence not specified Continue PPI/H2 blocker, diet discussed   ESOPHAGEAL STRICTURE Improved on last, follow up GI as recommended; continue PPI   Constipation, chronic Better off opioids  Spondylolisthesis, lumbar region Followed by spine specialist and pain management.   DDD (degenerative disc disease), lumbar Continue follow up pain management as needed  Spinal stenosis, lumbar region, with neurogenic claudication Continue follow up pain management  History of lumbar spinal fusion Continue follow up pain management    Over 30 minutes of exam, counseling, chart review, and critical decision making was performed  Future Appointments  Date Time Provider [place]  02/18/2020 10:15 AM [name], MD [facility] None  07/21/2020 10:00 AM [name], NP [facility] None  10/15/2020 10:00 AM Vicie Mutters, PA-C [facility] None     Subjective:   [name] [name] is a 84 y.o. female who presents for CPE and 3 month follow up on hypertension, prediabetes, hyperlipidemia, vitamin D def.   Patient has a chronic pain syndrome and is followed by Dr Nicholaus Bloom (pain management) and Dr Louanne Skye.  Hx of numerous lumbar surgeries, disc and fusions historically. She has been complaining of leg pain mainly at night, right  worse than left. She states she does not take the  gabapentin regulary, she gets symptoms around 4 oclock  Patient follows with Dr. Havery Moros for abdominal pain, negative workup. She had EGD 07/2019 that was normal other than esophageal candidiasis which she was treated for and she states her symptoms have improved.   Patient has hx/o Osteoporosis with T-2.6 at last DexaBMD on 11/2018 (improved from -2.8 in 2018) and she was recently put on prolia, followed by Dr. Estanislado Pandy.   She followes with Dr. Melvyn Novas for COPD/asthma, on [facility] but states it is expensive- may try advair, has been on before CT chest 04/2019 IMPRESSION: 1. Status post augmentation of multiple lower thoracic spine compression fractures. No new spine or rib fracture identified. 2. Increased atelectasis or scarring in both lungs. 3. Moderate-sized sliding hiatal hernia. 4. Aortic Atherosclerosis (ICD10-I70.0).  She has history of MGUS with negative BX.   she has a diagnosis of anxiety and is currently on xanax 0.25-0.5 mg TID PRN, reports symptoms are well controlled on current regimen. she reports she last took 2-3 weeks ago, needs sporadically.   BMI is Body mass index is 28.94 kg/m. She has been sick so she has not been able to eat.  Wt Readings from Last 3 Encounters:  10/16/19 158 lb 3.2 oz (71.8 kg)  08/20/19 170 lb 12.8 oz (77.5 kg)  08/13/19 168 lb 9.6 oz (76.5 kg)   She also has history of PTCA/stent placed in 2003 and low risk myoview in 2014.  She has aortic atherosclerosis per numerous CTs, e.g 04/2019.  Her blood pressure has been controlled at home, today their BP is BP: 116/74  She does not workout due to chronic pain.   She is on cholesterol medication (pravastatin 40 mg daily) and denies myalgias. Her cholesterol is at goal. The cholesterol last visit was:   Lab Results  Component Value Date   CHOL 178 07/05/2019   HDL 62 07/05/2019   LDLCALC 93 07/05/2019   TRIG 130 07/05/2019   CHOLHDL 2.9 07/05/2019    She has been working on diet and exercise  for prediabetes (x 2010), and denies polydipsia, polyuria and visual disturbances. Last A1C in the office was:  Lab Results  Component Value Date   HGBA1C 5.9 (H) 03/21/2019   Last GFR  Lab Results  Component Value Date   [facility] 66 10/09/2019   Patient is on Vitamin D supplement and at goal at recent check:  Lab Results  Component Value Date   VD25OH 89 03/21/2019      Medication Review  Current Outpatient Medications (Endocrine & Metabolic):  .  denosumab (PROLIA) 60 MG/ML SOSY injection, Inject 60 mg into the skin every 6 (six) months.  Current Facility-Administered Medications (Endocrine & Metabolic):  .  calcitonin (salmon) (MIACALCIN/FORTICAL) nasal spray 1 spray  Current Outpatient Medications (Cardiovascular):  .  amLODipine (NORVASC) 10 MG tablet, Take 1 tablet Daily for BP & Heart .  metoprolol tartrate (LOPRESSOR) 50 MG tablet, Take 1 tablet 2 x /day for BP & Heart (Patient taking differently: Take 50 mg by mouth 2 (two) times daily. Take 1 tablet 2 x /day for BP & Heart) .  nitroGLYCERIN (NITROSTAT) 0.4 MG SL tablet, Place 1 tablet (0.4 mg total) under the tongue every 5 (five) minutes as needed for chest pain. .  pravastatin (PRAVACHOL) 40 MG tablet, Take 40 mg by mouth daily.   Current Outpatient Medications (Respiratory):  [name]  BREO ELLIPTA 100-25 MCG/INH AEPB, INHALE 1 PUFF  BY MOUTH ONCE DAILY. RINSE MOUTH WITH WATER AFTER EACH USE. [name]  ipratropium-albuterol (DUONEB) 0.5-2.5 (3) MG/3ML SOLN, Take 3 mLs by nebulization every 6 (six) hours as needed (shortness of breath/wheezing).  .  promethazine (PHENERGAN) 25 MG tablet, Take 1 tablet every 4 hours if needed for Nausea or Vomitting   Current Outpatient Medications (Analgesics):  .  acetaminophen (TYLENOL) 500 MG tablet, Take 1 tablet (500 mg total) by mouth every 6 (six) hours as needed. [name]  aspirin EC 81 MG tablet, Take 81 mg by mouth at bedtime.    Current Outpatient Medications (Hematological):  .  vitamin  B-12 (CYANOCOBALAMIN) 1000 MCG tablet, Take 1,000 mcg by mouth daily.   Current Outpatient Medications (Other):  [name]  ALPRAZolam (XANAX) 0.5 MG tablet, Take 1/2 tablet 1- 2 x /day ONLY if needed for Anxiety Attack &  limit to 5 days /week to avoid Addiction - DO NOT take with Hydrocodone or Codeine (Patient taking differently: Take 0.25-0.5 mg by mouth daily as needed for anxiety. Take 1/2 tablet 1- 2 x /day ONLY if needed for Anxiety Attack &  limit to 5 days /week to avoid Addiction - DO NOT take with Hydrocodone or Codeine) .  ascorbic acid (VITAMIN C) 500 MG tablet, Take 500 mg by mouth 2 (two) times daily. .  Cholecalciferol (VITAMIN D3) 5000 units CAPS, Take 5,000 Units by mouth 2 (two) times daily.  .  Coral Calcium 1000 (390 Ca) MG TABS, Take 1,000 mg by mouth daily. .  diclofenac sodium (VOLTAREN) 1 % GEL, Apply 2 g topically 4 (four) times daily. (Patient taking differently: Apply 2 g topically 4 (four) times daily as needed (pain). ) .  dicyclomine (BENTYL) 20 MG tablet, Take 1 tablet 3 x  /day before meals for Cramping, Nausea or Diarhea .  DULoxetine (CYMBALTA) 30 MG capsule, Start taking 1 cap daily, for mood and chronic pain; increase to 2 caps in 1 month if tolerating well. .  gabapentin (NEURONTIN) 800 MG tablet, Take 1/2 to 1 tablet 3 x /day for Pain (Patient taking differently: Take 400 mg by mouth 3 (three) times daily. ) .  ketoconazole (NIZORAL) 2 % cream,  APPLY ONE APPLICATION TOPICALLY TWO TIMES DAILY (Patient taking differently: Apply 1 application topically 2 (two) times daily as needed for irritation. ) .  Magnesium 400 MG TABS, Take 400 mg by mouth 2 (two) times daily.  .  meclizine (ANTIVERT) 25 MG tablet, Take 1/2 to 1 tablet 2 to 3 x /day ONLY if needed  For Dizziness / Vertigo (Patient taking differently: Take 12.5-25 mg by mouth 3 (three) times daily as needed for dizziness. Take 1/2 to 1 tablet 2 to 3 x /day ONLY if needed  For Dizziness / Vertigo) .  Multiple  Vitamins-Minerals (MULTIVITAMIN WITH MINERALS) tablet, Take 1 tablet by mouth daily. [name]  omeprazole (PRILOSEC) 40 MG capsule, Take 1 capsule 2 x /day for Indigestion , Heartburn & Nausea (Patient taking differently: Take 40 mg by mouth 2 (two) times daily. Take 1 capsule 2 x /day for Indigestion , Heartburn & Nausea) .  oxybutynin (DITROPAN-XL) 10 MG 24 hr tablet, Take 1 tablet at Bedtime for Bladder .  polyethylene glycol powder (GLYCOLAX/MIRALAX) powder, Take 17 g by mouth 2 (two) times daily. (Patient taking differently: Take 17 g by mouth daily. ) .  prochlorperazine (COMPAZINE) 10 MG tablet, Take 1 tablet 3 x /day before for Meals to Prevent Nausea .  rOPINIRole (REQUIP) 1 MG tablet, Take  1 tablet at Bedtime for Rest Legs .  senna-docusate (SENOKOT-S) 8.6-50 MG tablet, Take 2 tablets by mouth 2 (two) times daily. .  sucralfate (CARAFATE) 1 g tablet, Take 1 tablet 4 x /day before Meals & Bedtime for Indigestion & Heartburn .  zinc gluconate 50 MG tablet, Take 50 mg by mouth daily.   Current Problems (verified) Patient Active Problem List   Diagnosis Date Noted  . Senile purpura ([place]) 07/05/2019  . Atherosclerosis of autologous vein coronary artery bypass graft with angina pectoris ([place]) 07/05/2019  . Abnormal glucose 03/01/2018  . FHx: heart disease 03/01/2018  . Therapeutic opioid-induced constipation ([facility]IC) 12/21/2017  . Abnormal UGI series 12/21/2017  . CKD (chronic kidney disease) stage 2, GFR 60-89 ml/min 09/27/2017  . T12 compression fracture ([place]) 09/26/2017  . Chronic bronchitis ([place]) 08/09/2017  . Atherosclerosis of aorta ([place]) 04/05/2017  . Anxiety state 06/08/2016  . Spinal stenosis, lumbar region, with neurogenic claudication 05/29/2015  . DDD (degenerative disc disease), lumbar 04/21/2015  . Hyperlipidemia, mixed 11/24/2014  . Gastroesophageal reflux disease 11/24/2014  . Depression, major, in partial remission ([place]) 09/26/2014  . Chronic pain syndrome 09/26/2014  .  Coronary artery disease due to lipid rich plaque   . Osteoporosis 02/10/2014  . Prediabetes 08/14/2013  . Vitamin D deficiency 08/14/2013  . Chronic constipation 04/03/2013  . MGUS (monoclonal gammopathy of unknown significance) 03/05/2013  . Vertebral compression fracture ([place]) 06/23/2012  . ESOPHAGEAL STRICTURE 08/28/2008  . Mild intermittent asthma without complication 03/05/2535  . Incarcerated recurrent paraesophageal hiatal hernia s/p lap redo repair [id number] 01/31/2008  . Essential hypertension 07/25/2007    Screening Tests Immunization History  Administered Date(s) Administered  . DT (Pediatric) 05/06/2014  . Influenza Split 02/07/2011  . Influenza Whole 02/09/2009, 01/19/2010, 02/13/2012  . Influenza, High Dose Seasonal PF 03/22/2013, 01/15/2014, 02/27/2015, 01/14/2016, 01/18/2017, 02/14/2018, 02/14/2019  . PFIZER SARS-COV-2 Vaccination 05/25/2019, 06/15/2019  . Pneumococcal Conjugate-13 05/06/2014  . Pneumococcal Polysaccharide-23 02/09/2011  . Pneumococcal-Unspecified 02/06/2010, 02/09/2012  . Td 05/10/2003   Health Maintenance  Topic Date Due  . INFLUENZA VACCINE  12/08/2019  . COLONOSCOPY  05/17/2020  . TETANUS/TDAP  04/24/2024  . DEXA SCAN  Completed  . COVID-19 Vaccine  Completed  . PNA vac Low Risk Adult  Completed    Preventative care: Last colonoscopy: 05/2017 EGD 12/2015 Last mammogram: 11/2016 DUE  Last pap smear/pelvic exam: remote,declines   DEXA 11/2018 L fem T -2.6, continue prolia per Dr. Estanislado Pandy  CT head 01/2014 Ct AB 11/2013 CT chest 04/2019 Myocardial perfusion 09/2014, previous MI, low risk study Echo 08/2014, EF 55-60%  Prior vaccinations: TD or Tdap: 04/2014  Influenza: 02/2019  Pneumococcal: 2012 Prevnar13:  2015 Shingles/Zostavax: declines due to cost Covid 19: 2/2, 2021  Names of Other Physician/Practitioners you currently use: 1. [facility]- here for primary care 2. Dr. Einar Gip, eye  doctor, last visit 2019 due this year 3. No dentist, dentures.    Patient Care Team: [name], MD as PCP - General (Internal Medicine) Jerline Pain, MD as PCP - Cardiology (Cardiology) Inda Castle, MD (Inactive) as Consulting Physician (Gastroenterology) Rigoberto Noel, MD as Consulting Physician (Pulmonary Disease) Larey Dresser, MD as Consulting Physician (Cardiology) Michael Boston, MD as Consulting Physician (General Surgery) Jessy Oto, MD as Consulting Physician (Orthopedic Surgery) Philemon Kingdom, MD as Consulting Physician (Endocrinology) Curt Bears, MD as Consulting Physician (Oncology)  Allergies Allergies  Allergen Reactions  . Ace Inhibitors Cough    Per Dr  [facility] pulmonology 2013  . Aspartame Diarrhea and Nausea And Vomiting  . Atorvastatin Other (See Comments)    Muscle aches  . Biaxin [Clarithromycin] Other (See Comments)    Unknown reaction : PATIENT CAN TOLERATE Z-PAK.  Is written on patient's paper chart.  [name] [Roflumilast] Nausea And Vomiting  . Erythromycin Other (See Comments)    Unknown reaction: PATIENT CAN TOLERATE Z-PAK.  It is written on patient's paper chart.  . Levofloxacin Nausea And Vomiting    SURGICAL HISTORY She  has a past surgical history that includes Back surgery; Bladder repair; Hemorrhoid surgery; Cholecystectomy; Appendectomy; Total abdominal hysterectomy (1975); Eye surgery (11/2000); Hiatal hernia repair (06/03/2011); Laparoscopic Nissen fundoplication (6/75/9163); Lumbar laminectomy/decompression microdiscectomy (Right, 06/26/2012); Esophagogastroduodenoscopy (N/A, 03/15/2013); Esophageal manometry (N/A, 03/25/2013); Hiatal hernia repair (N/A, 04/24/2013); Insertion of mesh (N/A, 04/24/2013); Laparoscopic lysis of adhesions (N/A, 04/24/2013); Fixation kyphoplasty lumbar spine (X 2); Coronary angioplasty (11/16/2001); Lumbar laminectomy/decompression microdiscectomy (N/A, 05/29/2015); Lumbar fusion (12/07/2015);  Esophagogastroduodenoscopy (N/A, 12/25/2015); IR KYPHO THORACIC WITH BONE BIOPSY (09/29/2017); IR KYPHO THORACIC WITH BONE BIOPSY (11/03/2017); Upper gastrointestinal endoscopy; IR KYPHO THORACIC WITH BONE BIOPSY (02/14/2019); and IR KYPHO THORACIC WITH BONE BIOPSY (04/02/2019). FAMILY HISTORY Her family history includes Brain cancer in her brother; Colon cancer (age of onset: 8) in her mother; Heart attack (age of onset: 53) in her father; Heart disease in her brother and brother; Kidney disease in her daughter. SOCIAL HISTORY She  reports that she has never smoked. She has never used smokeless tobacco. She reports that she does not drink alcohol or use drugs.    Objective:   Today's Vitals   10/16/19 1011  BP: 116/74  Pulse: 82  Temp: (!) 97.3 F (36.3 C)  SpO2: 97%  Weight: 158 lb 3.2 oz (71.8 kg)  Height: 5\' 2"  (1.575 m)   Body mass index is 28.94 kg/m.  General appearance: alert, no distress, WD/WN,  female HEENT: normocephalic, sclerae anicteric, TMs pearly, nares patent, no discharge or erythema, pharynx normal Oral cavity: MMM, no lesions Neck: supple, no lymphadenopathy, no thyromegaly, no masses Heart: RRR, normal S1, S2, no murmurs Lungs: CTA bilaterally, no wheezes, rhonchi, or rales Abdomen: +bs, rounded, non tender, no palpable masses, no hepatomegaly, no splenomegaly Musculoskeletal: nontender, no swelling, no obvious deformity Extremities: no edema, no cyanosis, no clubbing Pulses: 2+ symmetric, upper and lower extremities, normal cap refill Neurological: alert, oriented x 3, CN2-12 intact, strength 4/5 upper extremities and lower extremities, DTRs 2+ throughout, no cerebellar signs, gait antaglic with cane Psychiatric: normal affect, behavior normal, pleasant     Vicie Mutters, PA-C   10/16/2019

## 2019-10-16 ENCOUNTER — Ambulatory Visit (INDEPENDENT_AMBULATORY_CARE_PROVIDER_SITE_OTHER): Payer: PPO | Admitting: Physician Assistant

## 2019-10-16 ENCOUNTER — Other Ambulatory Visit: Payer: Self-pay

## 2019-10-16 ENCOUNTER — Encounter: Payer: Self-pay | Admitting: Physician Assistant

## 2019-10-16 VITALS — BP 116/74 | HR 82 | Temp 97.3°F | Ht 62.0 in | Wt 158.2 lb

## 2019-10-16 DIAGNOSIS — E782 Mixed hyperlipidemia: Secondary | ICD-10-CM | POA: Diagnosis not present

## 2019-10-16 DIAGNOSIS — R7309 Other abnormal glucose: Secondary | ICD-10-CM

## 2019-10-16 DIAGNOSIS — I2583 Coronary atherosclerosis due to lipid rich plaque: Secondary | ICD-10-CM

## 2019-10-16 DIAGNOSIS — E559 Vitamin D deficiency, unspecified: Secondary | ICD-10-CM | POA: Diagnosis not present

## 2019-10-16 DIAGNOSIS — D649 Anemia, unspecified: Secondary | ICD-10-CM

## 2019-10-16 DIAGNOSIS — D472 Monoclonal gammopathy: Secondary | ICD-10-CM | POA: Diagnosis not present

## 2019-10-16 DIAGNOSIS — J4520 Mild intermittent asthma, uncomplicated: Secondary | ICD-10-CM

## 2019-10-16 DIAGNOSIS — D692 Other nonthrombocytopenic purpura: Secondary | ICD-10-CM | POA: Diagnosis not present

## 2019-10-16 DIAGNOSIS — N182 Chronic kidney disease, stage 2 (mild): Secondary | ICD-10-CM

## 2019-10-16 DIAGNOSIS — G894 Chronic pain syndrome: Secondary | ICD-10-CM

## 2019-10-16 DIAGNOSIS — I25719 Atherosclerosis of autologous vein coronary artery bypass graft(s) with unspecified angina pectoris: Secondary | ICD-10-CM

## 2019-10-16 DIAGNOSIS — I700 Atherosclerosis of aorta: Secondary | ICD-10-CM | POA: Diagnosis not present

## 2019-10-16 DIAGNOSIS — J42 Unspecified chronic bronchitis: Secondary | ICD-10-CM

## 2019-10-16 DIAGNOSIS — E538 Deficiency of other specified B group vitamins: Secondary | ICD-10-CM

## 2019-10-16 DIAGNOSIS — M51369 Other intervertebral disc degeneration, lumbar region without mention of lumbar back pain or lower extremity pain: Secondary | ICD-10-CM

## 2019-10-16 DIAGNOSIS — M810 Age-related osteoporosis without current pathological fracture: Secondary | ICD-10-CM | POA: Diagnosis not present

## 2019-10-16 DIAGNOSIS — I10 Essential (primary) hypertension: Secondary | ICD-10-CM

## 2019-10-16 DIAGNOSIS — F3341 Major depressive disorder, recurrent, in partial remission: Secondary | ICD-10-CM | POA: Diagnosis not present

## 2019-10-16 DIAGNOSIS — S22060G Wedge compression fracture of T7-T8 vertebra, subsequent encounter for fracture with delayed healing: Secondary | ICD-10-CM

## 2019-10-16 DIAGNOSIS — M5136 Other intervertebral disc degeneration, lumbar region: Secondary | ICD-10-CM

## 2019-10-16 DIAGNOSIS — K219 Gastro-esophageal reflux disease without esophagitis: Secondary | ICD-10-CM

## 2019-10-16 DIAGNOSIS — Z0001 Encounter for general adult medical examination with abnormal findings: Secondary | ICD-10-CM

## 2019-10-16 DIAGNOSIS — Z79899 Other long term (current) drug therapy: Secondary | ICD-10-CM

## 2019-10-16 DIAGNOSIS — G2581 Restless legs syndrome: Secondary | ICD-10-CM

## 2019-10-16 DIAGNOSIS — I2510 Atherosclerotic heart disease of native coronary artery without angina pectoris: Secondary | ICD-10-CM

## 2019-10-16 DIAGNOSIS — M48062 Spinal stenosis, lumbar region with neurogenic claudication: Secondary | ICD-10-CM

## 2019-10-16 DIAGNOSIS — K5909 Other constipation: Secondary | ICD-10-CM

## 2019-10-16 DIAGNOSIS — F411 Generalized anxiety disorder: Secondary | ICD-10-CM

## 2019-10-16 MED ORDER — FLUTICASONE-SALMETEROL 250-50 MCG/DOSE IN AEPB: 1.0000 | INHALATION_SPRAY | Freq: Two times a day (BID) | RESPIRATORY_TRACT | 3 refills | Status: DC

## 2019-10-16 NOTE — Patient Instructions (Addendum)
Can increase the requip to 2 mg or 2 of the 1 mg pill about an hour before your symptoms start.  Can go up to 4 mg Let us know if you need a new medication for this or if it does not help.   HOW TO SCHEDULE A MAMMOGRAM  [facility]  7 a.m.-6:30 p.m., Monday 7 a.m.-5 p.m., Tuesday-Friday Schedule an appointment by calling [phone].    Restless Legs Syndrome Restless legs syndrome is a condition that causes uncomfortable feelings or sensations in the legs, especially while sitting or lying down. The sensations usually cause an overwhelming urge to move the legs. The arms can also sometimes be affected. The condition can range from mild to severe. The symptoms often interfere with a person's ability to sleep. What are the causes? The cause of this condition is not known. What increases the risk? The following factors may make you more likely to develop this condition:  Being older than 50.  Pregnancy.  Being a woman. In general, the condition is more common in women than in men.  A family history of the condition.  Having iron deficiency.  Overuse of caffeine, nicotine, or alcohol.  Certain medical conditions, such as kidney disease, Parkinson's disease, or nerve damage.  Certain medicines, such as those for high blood pressure, nausea, colds, allergies, depression, and some heart conditions. What are the signs or symptoms? The main symptom of this condition is uncomfortable sensations in the legs, such as:  Pulling.  Tingling.  Prickling.  Throbbing.  Crawling.  Burning. Usually, the sensations:  Affect both sides of the body.  Are worse when you sit or lie down.  Are worse at night. These may wake you up or make it difficult to fall asleep.  Make you have a strong urge to move your legs.  Are temporarily relieved by moving your legs. The arms can also be affected, but this is rare. People who have this condition often have  tiredness during the day because of their lack of sleep at night. How is this diagnosed? This condition may be diagnosed based on:  Your symptoms.  Blood tests. In some cases, you may be monitored in a sleep lab by a specialist (a sleep study). This can detect any disruptions in your sleep. How is this treated? This condition is treated by managing the symptoms. This may include:  Lifestyle changes, such as exercising, using relaxation techniques, and avoiding caffeine, alcohol, or tobacco.  Medicines. Anti-seizure medicines may be tried first. Follow these instructions at home:     General instructions  Take over-the-counter and prescription medicines only as told by your health care provider.  Use methods to help relieve the uncomfortable sensations, such as: ? Massaging your legs. ? Walking or stretching. ? Taking a cold or hot bath.  Keep all follow-up visits as told by your health care provider. This is important. Lifestyle  Practice good sleep habits. For example, go to bed and get up at the same time every day. Most adults should get 7-9 hours of sleep each night.  Exercise regularly. Try to get at least 30 minutes of exercise most days of the week.  Practice ways of relaxing, such as yoga or meditation.  Avoid caffeine and alcohol.  Do not use any products that contain nicotine or tobacco, such as cigarettes and e-cigarettes. If you need help quitting, ask your health care provider. Contact a health care provider if:  Your symptoms get worse  or they do not improve with treatment. Summary  Restless legs syndrome is a condition that causes uncomfortable feelings or sensations in the legs, especially while sitting or lying down.  The symptoms often interfere with a person's ability to sleep.  This condition is treated by managing the symptoms. You may need to make lifestyle changes or take medicines. This information is not intended to replace advice given to you  by your health care provider. Make sure you discuss any questions you have with your health care provider. Document Revised: 05/15/2017 Document Reviewed: 05/15/2017 Elsevier Patient Education  [place].

## 2019-10-17 LAB — CBC WITH DIFFERENTIAL/PLATELET
Absolute Monocytes: 705 cells/uL (ref 200–950)
Basophils Absolute: 57 cells/uL (ref 0–200)
Basophils Relative: 0.7 %
Eosinophils Absolute: 413 cells/uL (ref 15–500)
Eosinophils Relative: 5.1 %
HCT: 45.3 % — ABNORMAL HIGH (ref 35.0–45.0)
Hemoglobin: 15.2 g/dL (ref 11.7–15.5)
Lymphs Abs: 1960 cells/uL (ref 850–3900)
MCH: 31.9 pg (ref 27.0–33.0)
MCHC: 33.6 g/dL (ref 32.0–36.0)
MCV: 95 fL (ref 80.0–100.0)
MPV: 11.1 fL (ref 7.5–12.5)
Monocytes Relative: 8.7 %
Neutro Abs: 4965 cells/uL (ref 1500–7800)
Neutrophils Relative %: 61.3 %
Platelets: 368 10*3/uL (ref 140–400)
RBC: 4.77 10*6/uL (ref 3.80–5.10)
RDW: 12.5 % (ref 11.0–15.0)
Total Lymphocyte: 24.2 %
WBC: 8.1 10*3/uL (ref 3.8–10.8)

## 2019-10-17 LAB — COMPLETE METABOLIC PANEL WITH GFR
AG Ratio: 1.6 (calc) (ref 1.0–2.5)
ALT: 11 U/L (ref 6–29)
AST: 15 U/L (ref 10–35)
Albumin: 4.3 g/dL (ref 3.6–5.1)
Alkaline phosphatase (APISO): 53 U/L (ref 37–153)
BUN: 18 mg/dL (ref 7–25)
CO2: 26 mmol/L (ref 20–32)
Calcium: 9.4 mg/dL (ref 8.6–10.4)
Chloride: 103 mmol/L (ref 98–110)
Creat: 0.81 mg/dL (ref 0.60–0.88)
GFR, Est African American: 79 mL/min/{1.73_m2} (ref >=60)
GFR, Est Non African American: 68 mL/min/{1.73_m2} (ref >=60)
Globulin: 2.7 g/dL (calc) (ref 1.9–3.7)
Glucose, Bld: 92 mg/dL (ref 65–99)
Potassium: 5 mmol/L (ref 3.5–5.3)
Sodium: 139 mmol/L (ref 135–146)
Total Bilirubin: 0.7 mg/dL (ref 0.2–1.2)
Total Protein: 7 g/dL (ref 6.1–8.1)

## 2019-10-17 LAB — HEMOGLOBIN A1C
Hgb A1c MFr Bld: 5.7 % of total Hgb — ABNORMAL HIGH (ref <5.7)
Mean Plasma Glucose: 117 (calc)
eAG (mmol/L): 6.5 (calc)

## 2019-10-17 LAB — VITAMIN D 25 HYDROXY (VIT D DEFICIENCY, FRACTURES): Vit D, 25-Hydroxy: 92 ng/mL (ref 30–100)

## 2019-10-17 LAB — TSH: TSH: 0.55 mIU/L (ref 0.40–4.50)

## 2019-10-17 LAB — LIPID PANEL
Cholesterol: 163 mg/dL (ref <200)
HDL: 63 mg/dL (ref >=50)
LDL Cholesterol (Calc): 76 mg/dL (calc)
Non-HDL Cholesterol (Calc): 100 mg/dL (calc) (ref <130)
Total CHOL/HDL Ratio: 2.6 (calc) (ref <5.0)
Triglycerides: 142 mg/dL (ref <150)

## 2019-10-17 LAB — IRON, TOTAL/TOTAL IRON BINDING CAP
%SAT: 46 % (calc) — ABNORMAL HIGH (ref 16–45)
Iron: 149 ug/dL (ref 45–160)
TIBC: 324 mcg/dL (calc) (ref 250–450)

## 2019-10-17 LAB — MAGNESIUM: Magnesium: 2.2 mg/dL (ref 1.5–2.5)

## 2019-10-17 LAB — VITAMIN B12: Vitamin B-12: 1402 pg/mL — ABNORMAL HIGH (ref 200–1100)

## 2019-10-17 LAB — FERRITIN: Ferritin: 32 ng/mL (ref 16–288)

## 2019-10-18 NOTE — Progress Notes (Signed)
Patient is aware of lab results and instructions. [name] [name]

## 2019-10-24 ENCOUNTER — Encounter: Payer: Self-pay | Admitting: Physician Assistant

## 2019-10-24 ENCOUNTER — Ambulatory Visit (INDEPENDENT_AMBULATORY_CARE_PROVIDER_SITE_OTHER): Payer: PPO | Admitting: Physician Assistant

## 2019-10-24 ENCOUNTER — Other Ambulatory Visit: Payer: Self-pay

## 2019-10-24 VITALS — BP 130/72 | HR 63 | Temp 97.2°F | Wt 157.6 lb

## 2019-10-24 DIAGNOSIS — G2581 Restless legs syndrome: Secondary | ICD-10-CM | POA: Diagnosis not present

## 2019-10-24 DIAGNOSIS — J441 Chronic obstructive pulmonary disease with (acute) exacerbation: Secondary | ICD-10-CM

## 2019-10-24 DIAGNOSIS — R110 Nausea: Secondary | ICD-10-CM

## 2019-10-24 MED ORDER — PREDNISONE 20 MG PO TABS: ORAL_TABLET | ORAL | 0 refills | Status: DC

## 2019-10-24 MED ORDER — ROPINIROLE HCL 4 MG PO TABS: ORAL_TABLET | ORAL | 0 refills | Status: DC

## 2019-10-24 MED ORDER — AMOXICILLIN-POT CLAVULANATE 875-125 MG PO TABS: 1.0000 | ORAL_TABLET | Freq: Two times a day (BID) | ORAL | 0 refills | Status: DC

## 2019-10-24 NOTE — Progress Notes (Signed)
Subjective   Patient ID: [name] [name], female    DOB: 05-04-1936, 84 y.o.   MRN: [id number]  HPI 84 y.o. WF with history of asthma, COPD, large HH s/p multiple repairs, with recent esophageal candidiasis on EGD treated, and GERD presents with wheezing, leg pain, and nausea.   She has had nausea x Sunday and wheezing for 2 days with brown mucus and SOB. She still complains of bilateral leg pain, legs are jerking and figity. She denies diarrhea, flushing.    She is on gabapentin 300mg  3 x a day.  She is on cymbalta 30 mg once a day requip she is on once a day She has compazine and phenergan to help with nausea, has not taken any.   Patient follows with Dr. Havery Moros for abdominal pain, negative workup.  She had EGD 07/2019 that was normal other than esophageal candidiasis    Blood pressure 130/72, pulse 63, temperature (!) 97.2 F (36.2 C), weight 157 lb 9.6 oz (71.5 kg), SpO2 97 %.   Medications  Current Outpatient Medications (Endocrine & Metabolic):  .  denosumab (PROLIA) 60 MG/ML SOSY injection, Inject 60 mg into the skin every 6 (six) months.  Current Facility-Administered Medications (Endocrine & Metabolic):  .  calcitonin (salmon) (MIACALCIN/FORTICAL) nasal spray 1 spray  Current Outpatient Medications (Cardiovascular):  .  amLODipine (NORVASC) 10 MG tablet, Take 1 tablet Daily for BP & Heart .  metoprolol tartrate (LOPRESSOR) 50 MG tablet, Take 1 tablet 2 x /day for BP & Heart (Patient taking differently: Take 50 mg by mouth 2 (two) times daily. Take 1 tablet 2 x /day for BP & Heart) .  nitroGLYCERIN (NITROSTAT) 0.4 MG SL tablet, Place 1 tablet (0.4 mg total) under the tongue every 5 (five) minutes as needed for chest pain. .  pravastatin (PRAVACHOL) 40 MG tablet, Take 40 mg by mouth daily.   Current Outpatient Medications (Respiratory):  [name]  BREO ELLIPTA 100-25 MCG/INH AEPB, INHALE 1 PUFF BY MOUTH ONCE DAILY. RINSE MOUTH WITH WATER AFTER EACH USE. [name]   Fluticasone-Salmeterol (ADVAIR DISKUS) 250-50 MCG/DOSE AEPB, Inhale 1 puff into the lungs in the morning and at bedtime. [name]  ipratropium-albuterol (DUONEB) 0.5-2.5 (3) MG/3ML SOLN, Take 3 mLs by nebulization every 6 (six) hours as needed (shortness of breath/wheezing).  .  promethazine (PHENERGAN) 25 MG tablet, Take 1 tablet every 4 hours if needed for Nausea or Vomitting   Current Outpatient Medications (Analgesics):  .  acetaminophen (TYLENOL) 500 MG tablet, Take 1 tablet (500 mg total) by mouth every 6 (six) hours as needed. [name]  aspirin EC 81 MG tablet, Take 81 mg by mouth at bedtime.    Current Outpatient Medications (Hematological):  .  vitamin B-12 (CYANOCOBALAMIN) 1000 MCG tablet, Take 1,000 mcg by mouth daily.   Current Outpatient Medications (Other):  [name]  ALPRAZolam (XANAX) 0.5 MG tablet, Take 1/2 tablet 1- 2 x /day ONLY if needed for Anxiety Attack &  limit to 5 days /week to avoid Addiction - DO NOT take with Hydrocodone or Codeine (Patient taking differently: Take 0.25-0.5 mg by mouth daily as needed for anxiety. Take 1/2 tablet 1- 2 x /day ONLY if needed for Anxiety Attack &  limit to 5 days /week to avoid Addiction - DO NOT take with Hydrocodone or Codeine) .  ascorbic acid (VITAMIN C) 500 MG tablet, Take 500 mg by mouth 2 (two) times daily. .  Cholecalciferol (VITAMIN D3) 5000 units CAPS, Take 5,000 Units by mouth 2 (two) times daily.  [name]  Coral Calcium 1000 (390 Ca) MG TABS, Take 1,000 mg by mouth daily. .  diclofenac sodium (VOLTAREN) 1 % GEL, Apply 2 g topically 4 (four) times daily. (Patient taking differently: Apply 2 g topically 4 (four) times daily as needed (pain). ) .  dicyclomine (BENTYL) 20 MG tablet, Take 1 tablet 3 x  /day before meals for Cramping, Nausea or Diarhea .  DULoxetine (CYMBALTA) 30 MG capsule, Start taking 1 cap daily, for mood and chronic pain; increase to 2 caps in 1 month if tolerating well. .  gabapentin (NEURONTIN) 800 MG tablet, Take 1/2 to 1  tablet 3 x /day for Pain (Patient taking differently: Take 400 mg by mouth 3 (three) times daily. ) .  ketoconazole (NIZORAL) 2 % cream,  APPLY ONE APPLICATION TOPICALLY TWO TIMES DAILY (Patient taking differently: Apply 1 application topically 2 (two) times daily as needed for irritation. ) .  Magnesium 400 MG TABS, Take 400 mg by mouth 2 (two) times daily.  .  meclizine (ANTIVERT) 25 MG tablet, Take 1/2 to 1 tablet 2 to 3 x /day ONLY if needed  For Dizziness / Vertigo (Patient taking differently: Take 12.5-25 mg by mouth 3 (three) times daily as needed for dizziness. Take 1/2 to 1 tablet 2 to 3 x /day ONLY if needed  For Dizziness / Vertigo) .  Multiple Vitamins-Minerals (MULTIVITAMIN WITH MINERALS) tablet, Take 1 tablet by mouth daily. [name]  omeprazole (PRILOSEC) 40 MG capsule, Take 1 capsule 2 x /day for Indigestion , Heartburn & Nausea (Patient taking differently: Take 40 mg by mouth 2 (two) times daily. Take 1 capsule 2 x /day for Indigestion , Heartburn & Nausea) .  oxybutynin (DITROPAN-XL) 10 MG 24 hr tablet, Take 1 tablet at Bedtime for Bladder .  polyethylene glycol powder (GLYCOLAX/MIRALAX) powder, Take 17 g by mouth 2 (two) times daily. (Patient taking differently: Take 17 g by mouth daily. ) .  prochlorperazine (COMPAZINE) 10 MG tablet, Take 1 tablet 3 x /day before for Meals to Prevent Nausea .  rOPINIRole (REQUIP) 1 MG tablet, Take 1 tablet at Bedtime for Rest Legs .  senna-docusate (SENOKOT-S) 8.6-50 MG tablet, Take 2 tablets by mouth 2 (two) times daily. .  sucralfate (CARAFATE) 1 g tablet, Take 1 tablet 4 x /day before Meals & Bedtime for Indigestion & Heartburn .  zinc gluconate 50 MG tablet, Take 50 mg by mouth daily.   Problem list She has Essential hypertension; Mild intermittent asthma without complication; ESOPHAGEAL STRICTURE; Incarcerated recurrent paraesophageal hiatal hernia s/p lap redo repair [id number]; Vertebral compression fracture (HCC); MGUS (monoclonal gammopathy of  unknown significance); Chronic constipation; Prediabetes; Vitamin D deficiency; Osteoporosis; Coronary artery disease due to lipid rich plaque; Depression, major, in partial remission ([place]); Chronic pain syndrome; Hyperlipidemia, mixed; Gastroesophageal reflux disease; DDD (degenerative disc disease), lumbar; Spinal stenosis, lumbar region, with neurogenic claudication; Anxiety state; Atherosclerosis of aorta ([place]); Chronic bronchitis (HCC); T12 compression fracture (HCC); CKD (chronic kidney disease) stage 2, GFR 60-89 ml/min; Therapeutic opioid-induced constipation (OIC); Abnormal UGI series; Abnormal glucose; FHx: heart disease; Senile purpura ([place]); and 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  Review of Systems  See HPI    Objective:   Objective   Physical Exam Constitutional:      Appearance: She is well-developed.  HENT:     Head: Normocephalic and atraumatic.     Right Ear: External ear normal.     Left Ear: External ear normal.  Nose: Nose normal.  Eyes:     Conjunctiva/sclera: Conjunctivae normal.     Pupils: Pupils are equal, round, and reactive to light.  Cardiovascular:     Rate and Rhythm: Normal rate and regular rhythm.  Pulmonary:     Effort: Pulmonary effort is normal. No respiratory distress.     Breath sounds: Wheezing (diffuse) present. No rales.  Chest:     Chest wall: No tenderness.  Abdominal:     General: Bowel sounds are normal.     Palpations: Abdomen is soft.  Musculoskeletal:     Cervical back: Normal range of motion and neck supple.  Lymphadenopathy:     Cervical: No cervical adenopathy.  Skin:    General: Skin is warm and dry.  Neurological:     Mental Status: She is alert and oriented to person, place, and time.         Assessment & Plan:   COPD exacerbation (HCC) -     amoxicillin-clavulanate (AUGMENTIN) 875-125 MG tablet; Take 1 tablet by mouth 2 (two)  times daily. 7 days -     predniSONE (DELTASONE) 20 MG tablet; 2 tablets daily for 3 days, 1 tablet daily for 4 days. If not improved may need repeat CT or chest xray - may benefit from pulmonary referral  Restless legs -     rOPINIRole (REQUIP) 4 MG tablet; Take 1 tablet at Bedtime for Rest Legs - will try to increase from 1 to 4 mg - labs recently checked and normal ? May benefit from switching xanax to klonopin  Nausea Stop vitamins, continue PPI, follow up GI ? Coming from requip- may need to stop   The patient was advised to call immediately if she has any concerning symptoms in the interval. The patient voices understanding of current treatment options and is in agreement with the current care plan.The patient knows to call the clinic with any problems, questions or concerns or go to the ER if any further progression of symptoms.

## 2019-10-24 NOTE — Patient Instructions (Addendum)
Stop the vitamin C, Calcium, and zinc- see if that helps with the nausea Make sure you are on the prilosec 40 mg twice a day  Take the 4 mg of the requip about 1-2 hours before you want to rest for the evening Can try doing 1/2 pill around supper and 1/2 pill an hour before bed  You have a COPD exacerbation Go to the ER if any chest pain, shortness of breath, nausea, dizziness, severe HA, changes vision/speech    Chronic Obstructive Pulmonary Disease Exacerbation Chronic obstructive pulmonary disease (COPD) is a long-term (chronic) lung problem. In COPD, the flow of air from the lungs is limited. COPD exacerbations are times that breathing gets worse and you need more than your normal treatment. Without treatment, they can be life threatening. If they happen often, your lungs can become more damaged. If your COPD gets worse, your doctor may treat you with:  Medicines.  Oxygen.  Different ways to clear your airway, such as using a mask. Follow these instructions at home: Medicines  Take over-the-counter and prescription medicines only as told by your doctor.  If you take an antibiotic or steroid medicine, do not stop taking the medicine even if you start to feel better.  Keep up with shots (vaccinations) as told by your doctor. Be sure to get a yearly (annual) flu shot. Lifestyle  Do not smoke. If you need help quitting, ask your doctor.  Eat healthy foods.  Exercise regularly.  Get plenty of sleep.  Avoid tobacco smoke and other things that can bother your lungs.  Wash your hands often with soap and water. This will help keep you from getting an infection. If you cannot use soap and water, use hand sanitizer.  During flu season, avoid areas that are crowded with people. General instructions  Drink enough fluid to keep your pee (urine) clear or pale yellow. Do not do this if your doctor has told you not to.  Use a cool mist machine (vaporizer).  If you use oxygen or a  machine that turns medicine into a mist (nebulizer), continue to use it as told.  Follow all instructions for rehabilitation. These are steps you can take to make your body work better.  Keep all follow-up visits as told by your doctor. This is important. Contact a doctor if:  Your COPD symptoms get worse than normal. Get help right away if:  You are short of breath and it gets worse.  You have trouble talking.  You have chest pain.  You cough up blood.  You have a fever.  You keep throwing up (vomiting).  You feel weak or you pass out (faint).  You feel confused.  You are not able to sleep because of your symptoms.  You are not able to do daily activities. Summary  COPD exacerbations are times that breathing gets worse and you need more treatment than normal.  COPD exacerbations can be very serious and may cause your lungs to become more damaged.  Do not smoke. If you need help quitting, ask your doctor.  Stay up-to-date on your shots. Get a flu shot every year. This information is not intended to replace advice given to you by your health care provider. Make sure you discuss any questions you have with your health care provider. Document Revised: 04/07/2017 Document Reviewed: 05/30/2016 Elsevier Patient Education  2020 [employer].  Restless Legs Syndrome Restless legs syndrome is a condition that causes uncomfortable feelings or sensations in the legs, especially  while sitting or lying down. The sensations usually cause an overwhelming urge to move the legs. The arms can also sometimes be affected. The condition can range from mild to severe. The symptoms often interfere with a person's ability to sleep. What are the causes? The cause of this condition is not known. What increases the risk? The following factors may make you more likely to develop this condition:  Being older than 50.  Pregnancy.  Being a woman. In general, the condition is more common in  women than in men.  A family history of the condition.  Having iron deficiency.  Overuse of caffeine, nicotine, or alcohol.  Certain medical conditions, such as kidney disease, Parkinson's disease, or nerve damage.  Certain medicines, such as those for high blood pressure, nausea, colds, allergies, depression, and some heart conditions. What are the signs or symptoms? The main symptom of this condition is uncomfortable sensations in the legs, such as:  Pulling.  Tingling.  Prickling.  Throbbing.  Crawling.  Burning. Usually, the sensations:  Affect both sides of the body.  Are worse when you sit or lie down.  Are worse at night. These may wake you up or make it difficult to fall asleep.  Make you have a strong urge to move your legs.  Are temporarily relieved by moving your legs. The arms can also be affected, but this is rare. People who have this condition often have tiredness during the day because of their lack of sleep at night. How is this diagnosed? This condition may be diagnosed based on:  Your symptoms.  Blood tests. In some cases, you may be monitored in a sleep lab by a specialist (a sleep study). This can detect any disruptions in your sleep. How is this treated? This condition is treated by managing the symptoms. This may include:  Lifestyle changes, such as exercising, using relaxation techniques, and avoiding caffeine, alcohol, or tobacco.  Medicines. Anti-seizure medicines may be tried first. Follow these instructions at home:     General instructions  Take over-the-counter and prescription medicines only as told by your health care provider.  Use methods to help relieve the uncomfortable sensations, such as: ? Massaging your legs. ? Walking or stretching. ? Taking a cold or hot bath.  Keep all follow-up visits as told by your health care provider. This is important. Lifestyle  Practice good sleep habits. For example, go to bed and  get up at the same time every day. Most adults should get 7-9 hours of sleep each night.  Exercise regularly. Try to get at least 30 minutes of exercise most days of the week.  Practice ways of relaxing, such as yoga or meditation.  Avoid caffeine and alcohol.  Do not use any products that contain nicotine or tobacco, such as cigarettes and e-cigarettes. If you need help quitting, ask your health care provider. Contact a health care provider if:  Your symptoms get worse or they do not improve with treatment. Summary  Restless legs syndrome is a condition that causes uncomfortable feelings or sensations in the legs, especially while sitting or lying down.  The symptoms often interfere with a person's ability to sleep.  This condition is treated by managing the symptoms. You may need to make lifestyle changes or take medicines. This information is not intended to replace advice given to you by your health care provider. Make sure you discuss any questions you have with your health care provider. Document Revised: 05/15/2017 Document Reviewed: 05/15/2017 Elsevier  Patient Education  [employer].

## 2019-11-01 ENCOUNTER — Other Ambulatory Visit: Payer: Self-pay | Admitting: Physician Assistant

## 2019-11-01 DIAGNOSIS — G2581 Restless legs syndrome: Secondary | ICD-10-CM

## 2019-11-01 MED ORDER — ROPINIROLE HCL 4 MG PO TABS: ORAL_TABLET | ORAL | 0 refills | Status: DC

## 2019-11-18 DIAGNOSIS — G894 Chronic pain syndrome: Secondary | ICD-10-CM | POA: Diagnosis not present

## 2019-11-18 DIAGNOSIS — M150 Primary generalized (osteo)arthritis: Secondary | ICD-10-CM | POA: Diagnosis not present

## 2019-11-18 DIAGNOSIS — M961 Postlaminectomy syndrome, not elsewhere classified: Secondary | ICD-10-CM | POA: Diagnosis not present

## 2019-11-18 DIAGNOSIS — M4726 Other spondylosis with radiculopathy, lumbar region: Secondary | ICD-10-CM | POA: Diagnosis not present

## 2019-11-20 DIAGNOSIS — R351 Nocturia: Secondary | ICD-10-CM | POA: Diagnosis not present

## 2019-11-20 DIAGNOSIS — N3946 Mixed incontinence: Secondary | ICD-10-CM | POA: Diagnosis not present

## 2019-11-20 DIAGNOSIS — N311 Reflex neuropathic bladder, not elsewhere classified: Secondary | ICD-10-CM | POA: Diagnosis not present

## 2019-11-20 NOTE — Progress Notes (Deleted)
Subjective   Patient ID: [name] [name], female    DOB: 1935/11/10, 84 y.o.   MRN: [id number]  HPI 84 y.o. WF with history of asthma, COPD, large HH s/p multiple repairs, with recent esophageal candidiasis on EGD treated, and GERD presents for follow up for nausea, RLS, and COPD exacerbation.   She was treated 06/17 with Augmentin and prednisone, if not better will get CT scan.   Her Requip was increase from 1mg  to 4 mg last visit. She is on gabapentin 300mg  3 x a day.  She is on cymbalta 30 mg once a day  Patient follows with Dr. Havery Moros for abdominal pain, negative workup.  Last visit her vitamins were stopped to see if this helped nausea-  She had EGD 07/2019 that was normal other than esophageal candidiasis    There were no vitals taken for this visit.   Medications  Current Outpatient Medications (Endocrine & Metabolic):  .  denosumab (PROLIA) 60 MG/ML SOSY injection, Inject 60 mg into the skin every 6 (six) months.  Current Facility-Administered Medications (Endocrine & Metabolic):  .  calcitonin (salmon) (MIACALCIN/FORTICAL) nasal spray 1 spray  Current Outpatient Medications (Cardiovascular):  .  amLODipine (NORVASC) 10 MG tablet, Take 1 tablet Daily for BP & Heart .  metoprolol tartrate (LOPRESSOR) 50 MG tablet, Take 1 tablet 2 x /day for BP & Heart (Patient taking differently: Take 50 mg by mouth 2 (two) times daily. Take 1 tablet 2 x /day for BP & Heart) .  nitroGLYCERIN (NITROSTAT) 0.4 MG SL tablet, Place 1 tablet (0.4 mg total) under the tongue every 5 (five) minutes as needed for chest pain. .  pravastatin (PRAVACHOL) 40 MG tablet, Take 40 mg by mouth daily.   Current Outpatient Medications (Respiratory):  [name]  BREO ELLIPTA 100-25 MCG/INH AEPB, INHALE 1 PUFF BY MOUTH ONCE DAILY. RINSE MOUTH WITH WATER AFTER EACH USE. [name]  Fluticasone-Salmeterol (ADVAIR DISKUS) 250-50 MCG/DOSE AEPB, Inhale 1 puff into the lungs in the morning and at bedtime. [name]   ipratropium-albuterol (DUONEB) 0.5-2.5 (3) MG/3ML SOLN, Take 3 mLs by nebulization every 6 (six) hours as needed (shortness of breath/wheezing).  .  promethazine (PHENERGAN) 25 MG tablet, Take 1 tablet every 4 hours if needed for Nausea or Vomitting   Current Outpatient Medications (Analgesics):  .  acetaminophen (TYLENOL) 500 MG tablet, Take 1 tablet (500 mg total) by mouth every 6 (six) hours as needed. [name]  aspirin EC 81 MG tablet, Take 81 mg by mouth at bedtime.    Current Outpatient Medications (Hematological):  .  vitamin B-12 (CYANOCOBALAMIN) 1000 MCG tablet, Take 1,000 mcg by mouth daily.   Current Outpatient Medications (Other):  [name]  ALPRAZolam (XANAX) 0.5 MG tablet, Take 1/2 tablet 1- 2 x /day ONLY if needed for Anxiety Attack &  limit to 5 days /week to avoid Addiction - DO NOT take with Hydrocodone or Codeine (Patient taking differently: Take 0.25-0.5 mg by mouth daily as needed for anxiety. Take 1/2 tablet 1- 2 x /day ONLY if needed for Anxiety Attack &  limit to 5 days /week to avoid Addiction - DO NOT take with Hydrocodone or Codeine) .  ascorbic acid (VITAMIN C) 500 MG tablet, Take 500 mg by mouth 2 (two) times daily. .  Cholecalciferol (VITAMIN D3) 5000 units CAPS, Take 5,000 Units by mouth 2 (two) times daily.  .  Coral Calcium 1000 (390 Ca) MG TABS, Take 1,000 mg by mouth daily. .  diclofenac sodium (VOLTAREN) 1 % GEL, Apply  2 g topically 4 (four) times daily. (Patient taking differently: Apply 2 g topically 4 (four) times daily as needed (pain). ) .  dicyclomine (BENTYL) 20 MG tablet, Take 1 tablet 3 x  /day before meals for Cramping, Nausea or Diarhea .  DULoxetine (CYMBALTA) 30 MG capsule, Start taking 1 cap daily, for mood and chronic pain; increase to 2 caps in 1 month if tolerating well. .  gabapentin (NEURONTIN) 800 MG tablet, Take 1/2 to 1 tablet 3 x /day for Pain (Patient taking differently: Take 400 mg by mouth 3 (three) times daily. ) .  ketoconazole (NIZORAL) 2  % cream,  APPLY ONE APPLICATION TOPICALLY TWO TIMES DAILY (Patient taking differently: Apply 1 application topically 2 (two) times daily as needed for irritation. ) .  Magnesium 400 MG TABS, Take 400 mg by mouth 2 (two) times daily.  .  meclizine (ANTIVERT) 25 MG tablet, Take 1/2 to 1 tablet 2 to 3 x /day ONLY if needed  For Dizziness / Vertigo (Patient taking differently: Take 12.5-25 mg by mouth 3 (three) times daily as needed for dizziness. Take 1/2 to 1 tablet 2 to 3 x /day ONLY if needed  For Dizziness / Vertigo) .  Multiple Vitamins-Minerals (MULTIVITAMIN WITH MINERALS) tablet, Take 1 tablet by mouth daily. [name]  omeprazole (PRILOSEC) 40 MG capsule, Take 1 capsule 2 x /day for Indigestion , Heartburn & Nausea (Patient taking differently: Take 40 mg by mouth 2 (two) times daily. Take 1 capsule 2 x /day for Indigestion , Heartburn & Nausea) .  oxybutynin (DITROPAN-XL) 10 MG 24 hr tablet, Take 1 tablet at Bedtime for Bladder .  polyethylene glycol powder (GLYCOLAX/MIRALAX) powder, Take 17 g by mouth 2 (two) times daily. (Patient taking differently: Take 17 g by mouth daily. ) .  prochlorperazine (COMPAZINE) 10 MG tablet, Take 1 tablet 3 x /day before for Meals to Prevent Nausea .  rOPINIRole (REQUIP) 1 MG tablet, Take 1 tablet at Bedtime for Rest Legs .  senna-docusate (SENOKOT-S) 8.6-50 MG tablet, Take 2 tablets by mouth 2 (two) times daily. .  sucralfate (CARAFATE) 1 g tablet, Take 1 tablet 4 x /day before Meals & Bedtime for Indigestion & Heartburn .  zinc gluconate 50 MG tablet, Take 50 mg by mouth daily.   Problem list She has Essential hypertension; Mild intermittent asthma without complication; ESOPHAGEAL STRICTURE; Incarcerated recurrent paraesophageal hiatal hernia s/p lap redo repair [id number]; Vertebral compression fracture (HCC); MGUS (monoclonal gammopathy of unknown significance); Chronic constipation; Prediabetes; Vitamin D deficiency; Osteoporosis; Coronary artery disease due to lipid  rich plaque; Depression, major, in partial remission ([place]); Chronic pain syndrome; Hyperlipidemia, mixed; Gastroesophageal reflux disease; DDD (degenerative disc disease), lumbar; Spinal stenosis, lumbar region, with neurogenic claudication; Anxiety state; Atherosclerosis of aorta ([place]); Chronic bronchitis (HCC); T12 compression fracture (HCC); CKD (chronic kidney disease) stage 2, GFR 60-89 ml/min; Therapeutic opioid-induced constipation (OIC); Abnormal UGI series; Abnormal glucose; FHx: heart disease; Senile purpura ([place]); and 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  Review of Systems  See HPI    Objective:   Objective   Physical Exam Constitutional:      Appearance: She is well-developed.  HENT:     Head: Normocephalic and atraumatic.     Right Ear: External ear normal.     Left Ear: External ear normal.     Nose: Nose normal.  Eyes:     Conjunctiva/sclera: Conjunctivae normal.     Pupils: Pupils are  equal, round, and reactive to light.  Cardiovascular:     Rate and Rhythm: Normal rate and regular rhythm.  Pulmonary:     Effort: Pulmonary effort is normal. No respiratory distress.     Breath sounds: Wheezing (diffuse) present. No rales.  Chest:     Chest wall: No tenderness.  Abdominal:     General: Bowel sounds are normal.     Palpations: Abdomen is soft.  Musculoskeletal:     Cervical back: Normal range of motion and neck supple.  Lymphadenopathy:     Cervical: No cervical adenopathy.  Skin:    General: Skin is warm and dry.  Neurological:     Mental Status: She is alert and oriented to person, place, and time.         Assessment & Plan:

## 2019-11-21 ENCOUNTER — Ambulatory Visit: Payer: PPO | Admitting: Physician Assistant

## 2019-11-26 NOTE — Progress Notes (Signed)
Subjective   Patient ID: [name] [name], female    DOB: June 14, 1935, 84 y.o.   MRN: [id number]  HPI 84 y.o. WF with history of asthma, COPD, large HH s/p multiple repairs, with recent esophageal candidiasis on EGD treated, and GERD presents for follow up for nausea, RLS, and COPD exacerbation.   She was treated 06/17 with Augmentin and prednisone, states she is doing better. Any progression of symptoms, she needs a CT scan.   Her Requip was increase from 1mg  to 4 mg last visit, states legs are doing better, takes around 930 pm- goes to bed around 1230. She is on gabapentin 300mg  3 x a day.  She is on cymbalta 30 mg once a day  She did fall in the yard 2 weeks ago, was bending over messing with her tomato plants and started to fall backwards onto her right flank, states she continues to have some pain there. She fell on grass, did not hit head, no LOC. Taking tyelnol, no ice or heat on it. She denies pain down her legs, dark urine, pain with breathing.   Patient follows with Dr. Havery Moros for abdominal pain, negative workup.  Last visit her vitamins, vitamin C, zinc and calcium were stopped to see if this helped nausea-she said her nausea improved however the last 2 days she has had nausea in the morning before taking meds and before eating, improves with meds/food.  She is on prilosec twice a day and she is taking the carafate before each meal.   She had EGD 07/2019 that was normal other than esophageal candidiasis    Blood pressure 124/72, pulse 62, temperature 97.6 F (36.4 C), weight 162 lb (73.5 kg), SpO2 96 %.   Medications   Current Outpatient Medications (Endocrine & Metabolic):  .  denosumab (PROLIA) 60 MG/ML SOSY injection, Inject 60 mg into the skin every 6 (six) months. .  predniSONE (DELTASONE) 20 MG tablet, 2 tablets daily for 3 days, 1 tablet daily for 4 days.  Current Facility-Administered Medications (Endocrine & Metabolic):  .  calcitonin (salmon)  (MIACALCIN/FORTICAL) nasal spray 1 spray  Current Outpatient Medications (Cardiovascular):  .  amLODipine (NORVASC) 10 MG tablet, Take 1 tablet Daily for BP & Heart .  metoprolol tartrate (LOPRESSOR) 50 MG tablet, Take 1 tablet 2 x /day for BP & Heart (Patient taking differently: Take 50 mg by mouth 2 (two) times daily. Take 1 tablet 2 x /day for BP & Heart) .  nitroGLYCERIN (NITROSTAT) 0.4 MG SL tablet, Place 1 tablet (0.4 mg total) under the tongue every 5 (five) minutes as needed for chest pain. .  pravastatin (PRAVACHOL) 40 MG tablet, Take 40 mg by mouth daily.   Current Outpatient Medications (Respiratory):  [name]  Fluticasone-Salmeterol (ADVAIR DISKUS) 250-50 MCG/DOSE AEPB, Inhale 1 puff into the lungs in the morning and at bedtime. [name]  ipratropium-albuterol (DUONEB) 0.5-2.5 (3) MG/3ML SOLN, Take 3 mLs by nebulization every 6 (six) hours as needed (shortness of breath/wheezing).  .  promethazine (PHENERGAN) 25 MG tablet, Take 1 tablet every 4 hours if needed for Nausea or Vomitting   Current Outpatient Medications (Analgesics):  .  acetaminophen (TYLENOL) 500 MG tablet, Take 1 tablet (500 mg total) by mouth every 6 (six) hours as needed. [name]  aspirin EC 81 MG tablet, Take 81 mg by mouth at bedtime.    Current Outpatient Medications (Hematological):  .  vitamin B-12 (CYANOCOBALAMIN) 1000 MCG tablet, Take 1,000 mcg by mouth daily.   Current Outpatient Medications (Other):  .  ALPRAZolam (XANAX) 0.5 MG tablet, Take 1/2 tablet 1- 2 x /day ONLY if needed for Anxiety Attack &  limit to 5 days /week to avoid Addiction - DO NOT take with Hydrocodone or Codeine (Patient taking differently: Take 0.25-0.5 mg by mouth daily as needed for anxiety. Take 1/2 tablet 1- 2 x /day ONLY if needed for Anxiety Attack &  limit to 5 days /week to avoid Addiction - DO NOT take with Hydrocodone or Codeine) .  Cholecalciferol (VITAMIN D3) 5000 units CAPS, Take 5,000 Units by mouth 2 (two) times daily.  .  diclofenac  sodium (VOLTAREN) 1 % GEL, Apply 2 g topically 4 (four) times daily. (Patient taking differently: Apply 2 g topically 4 (four) times daily as needed (pain). ) .  dicyclomine (BENTYL) 20 MG tablet, Take 1 tablet 3 x  /day before meals for Cramping, Nausea or Diarhea .  DULoxetine (CYMBALTA) 30 MG capsule, Start taking 1 cap daily, for mood and chronic pain; increase to 2 caps in 1 month if tolerating well. .  gabapentin (NEURONTIN) 800 MG tablet, Take 1/2 to 1 tablet 3 x /day for Pain (Patient taking differently: Take 400 mg by mouth 3 (three) times daily. ) .  ketoconazole (NIZORAL) 2 % cream,  APPLY ONE APPLICATION TOPICALLY TWO TIMES DAILY (Patient taking differently: Apply 1 application topically 2 (two) times daily as needed for irritation. ) .  Magnesium 400 MG TABS, Take 400 mg by mouth 2 (two) times daily.  .  meclizine (ANTIVERT) 25 MG tablet, Take 1/2 to 1 tablet 2 to 3 x /day ONLY if needed  For Dizziness / Vertigo (Patient taking differently: Take 12.5-25 mg by mouth 3 (three) times daily as needed for dizziness. Take 1/2 to 1 tablet 2 to 3 x /day ONLY if needed  For Dizziness / Vertigo) .  Multiple Vitamins-Minerals (MULTIVITAMIN WITH MINERALS) tablet, Take 1 tablet by mouth daily. [name]  omeprazole (PRILOSEC) 40 MG capsule, Take 1 capsule 2 x /day for Indigestion , Heartburn & Nausea (Patient taking differently: Take 40 mg by mouth 2 (two) times daily. Take 1 capsule 2 x /day for Indigestion , Heartburn & Nausea) .  oxybutynin (DITROPAN-XL) 10 MG 24 hr tablet, Take 1 tablet at Bedtime for Bladder .  polyethylene glycol powder (GLYCOLAX/MIRALAX) powder, Take 17 g by mouth 2 (two) times daily. (Patient taking differently: Take 17 g by mouth daily. ) .  prochlorperazine (COMPAZINE) 10 MG tablet, Take 1 tablet 3 x /day before for Meals to Prevent Nausea .  rOPINIRole (REQUIP) 4 MG tablet, Take 1 tablet at Bedtime for Rest Legs .  senna-docusate (SENOKOT-S) 8.6-50 MG tablet, Take 2 tablets by mouth  2 (two) times daily. .  sucralfate (CARAFATE) 1 g tablet, Take 1 tablet 4 x /day before Meals & Bedtime for Indigestion & Heartburn .  amoxicillin-clavulanate (AUGMENTIN) 875-125 MG tablet, Take 1 tablet by mouth 2 (two) times daily. 7 days (Patient not taking: Reported on 11/27/2019) .  baclofen (LIORESAL) 10 MG tablet, Take 1/2 to 1 tablet 2 x day if needed for muscle spasm   Problem list She has Essential hypertension; Mild intermittent asthma without complication; ESOPHAGEAL STRICTURE; Incarcerated recurrent paraesophageal hiatal hernia s/p lap redo repair [id number]; Vertebral compression fracture (HCC); MGUS (monoclonal gammopathy of unknown significance); Chronic constipation; Prediabetes; Vitamin D deficiency; Osteoporosis; Coronary artery disease due to lipid rich plaque; Depression, major, in partial remission ([place]); Chronic pain syndrome; Hyperlipidemia, mixed; Gastroesophageal reflux disease; DDD (degenerative disc disease), lumbar; Spinal stenosis,  lumbar region, with neurogenic claudication; Anxiety state; Atherosclerosis of aorta ([place]); Chronic bronchitis (HCC); T12 compression fracture (HCC); CKD (chronic kidney disease) stage 2, GFR 60-89 ml/min; Therapeutic opioid-induced constipation (OIC); Abnormal UGI series; Abnormal glucose; FHx: heart disease; Senile purpura ([place]); and Atherosclerosis of autologous vein coronary artery bypass graft with angina pectoris ([place]) on their problem list.  Review of Systems  See HPI    Objective:   Objective   Physical Exam Constitutional:      Appearance: She is well-developed.  HENT:     Head: Normocephalic and atraumatic.     Right Ear: External ear normal.     Left Ear: External ear normal.     Nose: Nose normal.  Eyes:     Conjunctiva/sclera: Conjunctivae normal.     Pupils: Pupils are equal, round, and reactive to light.  Cardiovascular:     Rate and Rhythm: Normal rate and regular rhythm.  Pulmonary:     Effort: Pulmonary effort is  normal. No respiratory distress.     Breath sounds: Coarse breath sounds present. No rales.  Chest:     Chest wall: No tenderness.  Abdominal:     General: Bowel sounds are normal.     Palpations: Abdomen is soft.  Musculoskeletal:     Cervical back: Normal range of motion and neck supple.     Lumbar back: With brace in place, no rash or eccymosis on right lower back, + tenderness to palpation, negative straight leg raise. Lymphadenopathy:     Cervical: No cervical adenopathy.  Skin:    General: Skin is warm and dry.  Neurological:     Mental Status: She is alert and oriented to person, place, and time.         Assessment & Plan:   Restless legs Continue requip dose  Nausea Better off vitamins- stay off them Some in the AM only- take meclizine for a while at night to see if helps  Chronic right-sided low back pain without sciatica Use heat, continue back brace and tylenol If not better call office Go to the ER if you have any new weakness in your legs, have trouble controlling your urine or bowels, or have worsening pain.  -     baclofen (LIORESAL) 10 MG tablet; Take 1/2 to 1 tablet 2 x day if needed for muscle spasm    Future Appointments  Date Time Provider [place]  01/22/2020 10:30 AM Vicie Mutters, PA-C [facility] None  02/18/2020 10:15 AM [name], MD [facility] None  07/21/2020 10:30 AM Vicie Mutters, PA-C [facility] None  10/15/2020 10:00 AM Vicie Mutters, PA-C [facility] None

## 2019-11-27 ENCOUNTER — Other Ambulatory Visit: Payer: Self-pay

## 2019-11-27 ENCOUNTER — Ambulatory Visit (INDEPENDENT_AMBULATORY_CARE_PROVIDER_SITE_OTHER): Payer: PPO | Admitting: Physician Assistant

## 2019-11-27 ENCOUNTER — Encounter: Payer: Self-pay | Admitting: Physician Assistant

## 2019-11-27 VITALS — BP 124/72 | HR 62 | Temp 97.6°F | Wt 162.0 lb

## 2019-11-27 DIAGNOSIS — R110 Nausea: Secondary | ICD-10-CM

## 2019-11-27 DIAGNOSIS — G8929 Other chronic pain: Secondary | ICD-10-CM

## 2019-11-27 DIAGNOSIS — M545 Low back pain: Secondary | ICD-10-CM

## 2019-11-27 DIAGNOSIS — G2581 Restless legs syndrome: Secondary | ICD-10-CM | POA: Diagnosis not present

## 2019-11-27 MED ORDER — BACLOFEN 10 MG PO TABS: ORAL_TABLET | ORAL | 1 refills | Status: DC

## 2019-11-27 NOTE — Patient Instructions (Signed)
Get eyes checked  Take meclizine at night for a few nights.   Your ears and sinuses are connected by the eustachian tube. When your sinuses are inflamed, this can close off the tube and cause fluid to collect in your middle ear. This can then cause dizziness, popping, clicking, ringing, and echoing in your ears. This is often NOT an infection and does NOT require antibiotics, it is caused by inflammation so the treatments help the inflammation. This can take a long time to get better so please be patient.  Here are things you can do to help with this: - Try the Flonase or Nasonex. Remember to spray each nostril twice towards the outer part of your eye.  Do not sniff but instead pinch your nose and tilt your head back to help the medicine get into your sinuses.  The best time to do this is at bedtime.Stop if you get blurred vision or nose bleeds.  -While drinking fluids, pinch and hold nose close and swallow, to help open eustachian tubes to drain fluid behind ear drums. -Please pick one of the over the counter allergy medications below and take it once daily for allergies.  It will also help with fluid behind ear drums. Claritin or loratadine cheapest but likely the weakest  Zyrtec or certizine at night because it can make you sleepy The strongest is allegra or fexafinadine  Cheapest at [employer], [employer], [employer] -can use decongestant over the counter, please do not use if you have high blood pressure or certain heart conditions.   if worsening HA, changes vision/speech, imbalance, weakness go to the ER   Can try baclofen for back pain, do heat and continue back brace Go to the ER if you have any new weakness in your legs, have trouble controlling your urine or bowels, or have worsening pain.

## 2019-11-28 ENCOUNTER — Other Ambulatory Visit: Payer: Self-pay | Admitting: Internal Medicine

## 2019-11-28 DIAGNOSIS — B372 Candidiasis of skin and nail: Secondary | ICD-10-CM

## 2019-11-29 ENCOUNTER — Ambulatory Visit: Payer: Self-pay

## 2019-11-29 ENCOUNTER — Ambulatory Visit: Payer: PPO | Admitting: Family

## 2019-11-29 ENCOUNTER — Ambulatory Visit (INDEPENDENT_AMBULATORY_CARE_PROVIDER_SITE_OTHER): Payer: PPO

## 2019-11-29 ENCOUNTER — Encounter: Payer: Self-pay | Admitting: Family

## 2019-11-29 VITALS — Ht 62.0 in | Wt 162.0 lb

## 2019-11-29 DIAGNOSIS — R0781 Pleurodynia: Secondary | ICD-10-CM

## 2019-11-29 DIAGNOSIS — M25531 Pain in right wrist: Secondary | ICD-10-CM

## 2019-11-29 MED ORDER — HYDROCODONE-ACETAMINOPHEN 5-325 MG PO TABS: 1.0000 | ORAL_TABLET | ORAL | 0 refills | Status: DC | PRN

## 2019-12-03 ENCOUNTER — Encounter: Payer: Self-pay | Admitting: Family

## 2019-12-04 NOTE — Progress Notes (Signed)
Office Visit Note   Patient: [name] [name]           Date of Birth: 11-07-1935           MRN: [id number] Visit Date: 11/29/2019              Requested by: [name], [address] [place],  [place] [address] PCP: [name], MD  Chief Complaint  Patient presents with  . Right Wrist - Pain    S/p fall last night       HPI: Patient is an 84 year old woman who presents today after a fall last night.  She is unsure exactly the mechanism of injury had immediate onset of right wrist pain with swelling.  There is associated bruising she feels she is unable to range of motion her wrist having some right-sided chest pain as well wonders if she broke some ribs pain with deep inspiration there is no chest pain no shortness of breath no difficulty breathing pain with laughing and deep inspiration.  Assessment & Plan: Visit Diagnoses:  1. Pain in right wrist   2. Rib pain     Plan: Pain management for possible rib fractures as well as right wrist injury.  Placed her in a wrist splint.  She will take the wrist out of the splint and work on gentle range of motion as she tolerates.  We will follow-up in 2 weeks with repeat radiographs of the wrist.  Possible avulsion fracture  Follow-Up Instructions: Return in about 2 weeks (around 12/13/2019).   Right Hand Exam   Tenderness  The patient is experiencing tenderness in the ulnar area, radial area and snuff box (snuff box most tender area).  Muscle Strength  Grip: 3/5   Other  Erythema: absent Sensation: normal Pulse: present  Comments:  Right hand dominant     Left Hand Exam   Muscle Strength  Grip:  5/5       Patient is alert, oriented, no adenopathy, well-dressed, normal affect, normal respiratory effort. On examination of chest and right flank respirations are easy she does have poorly localized tenderness around the right flank as well as the right breast ecchymosis to the right  breast.  Imaging: No results found. No images are attached to the encounter.  Labs: Lab Results  Component Value Date   HGBA1C 5.7 (H) 10/16/2019   HGBA1C 5.9 (H) 03/21/2019   HGBA1C 5.8 (H) 09/11/2018   ESRSEDRATE 9 10/26/2017   ESRSEDRATE 1 02/27/2015   REPTSTATUS 04/15/2018 FINAL 04/13/2018   CULT 10,000 COLONIES/mL CITROBACTER FREUNDII (A) 04/13/2018   LABORGA CITROBACTER FREUNDII (A) 04/13/2018     Lab Results  Component Value Date   ALBUMIN 4.0 06/10/2019   ALBUMIN 4.4 04/29/2019   ALBUMIN 3.0 (L) 05/15/2018    Lab Results  Component Value Date   MG 2.2 10/16/2019   MG 2.0 07/05/2019   MG 2.2 03/21/2019   Lab Results  Component Value Date   VD25OH 92 10/16/2019   VD25OH 89 03/21/2019   VD25OH 56 09/11/2018    No results found for: PREALBUMIN CBC EXTENDED Latest Ref Rng & Units 10/16/2019 10/09/2019 07/05/2019  WBC 3.8 - 10.8 Thousand/uL 8.1 7.6 8.9  RBC 3.80 - 5.10 Million/uL 4.77 4.90 4.72  HGB 11.7 - 15.5 g/dL 15.2 15.6(H) 14.9  HCT 35 - 45 % 45.3(H) 46.3(H) 43.6  PLT 140 - 400 Thousand/uL 368 366 350  NEUTROABS 1,500 - 7,800 cells/uL 4,965 4,674 5,625  LYMPHSABS 850 -  3,900 cells/uL 1,960 1,976 2,029     Body mass index is 29.63 kg/m.  Orders:  Orders Placed This Encounter  Procedures  . XR Wrist 2 Views Right  . XR Chest 2 View   Meds ordered this encounter  Medications  . HYDROcodone-acetaminophen (NORCO/VICODIN) 5-325 MG tablet    Sig: Take 1 tablet by mouth every 4 (four) hours as needed for moderate pain.    Dispense:  40 tablet    Refill:  0     Procedures: No procedures performed  Clinical Data: No additional findings.  ROS:  All other systems negative, except as noted in the HPI. Review of Systems  Constitutional: Negative for chills and fever.  Musculoskeletal: Positive for arthralgias and joint swelling.    Objective: Vital Signs: Ht 5\' 2"  (1.575 m)   Wt 162 lb (73.5 kg)   BMI 29.63 kg/m   Specialty Comments:  No  specialty comments available.  PMFS History: Patient Active Problem List   Diagnosis Date Noted  . Senile purpura ([place]) 07/05/2019  . Atherosclerosis of autologous vein coronary artery bypass graft with angina pectoris ([place]) 07/05/2019  . Abnormal glucose 03/01/2018  . FHx: heart disease 03/01/2018  . Therapeutic opioid-induced constipation (OIC) 12/21/2017  . Abnormal UGI series 12/21/2017  . CKD (chronic kidney disease) stage 2, GFR 60-89 ml/min 09/27/2017  . T12 compression fracture ([place]) 09/26/2017  . Chronic bronchitis ([place]) 08/09/2017  . Atherosclerosis of aorta ([place]) 04/05/2017  . Anxiety state 06/08/2016  . Spinal stenosis, lumbar region, with neurogenic claudication 05/29/2015  . DDD (degenerative disc disease), lumbar 04/21/2015  . Hyperlipidemia, mixed 11/24/2014  . Gastroesophageal reflux disease 11/24/2014  . Depression, major, in partial remission ([place]) 09/26/2014  . Chronic pain syndrome 09/26/2014  . Coronary artery disease due to lipid rich plaque   . Osteoporosis 02/10/2014  . Prediabetes 08/14/2013  . Vitamin D deficiency 08/14/2013  . Chronic constipation 04/03/2013  . MGUS (monoclonal gammopathy of unknown significance) 03/05/2013  . Vertebral compression fracture ([place]) 06/23/2012  . ESOPHAGEAL STRICTURE 08/28/2008  . Mild intermittent asthma without complication 29/51/8841  . Incarcerated recurrent paraesophageal hiatal hernia s/p lap redo repair [id number] 01/31/2008  . Essential hypertension 07/25/2007   Past Medical History:  Diagnosis Date  . Anxiety    takes Xanax daily as needed  . Asthma    Albuterol daily as needed  . Blood transfusion without reported diagnosis   . CAD (coronary artery disease)    LHC 2003 with 40% pLAD, 30% mLAD, 100% D1 (moderate vessel), 100%  D2 (small vessel), 95% mid small OM2.  Pt had PTCA to D1;  D2 and OM2 small vessels and tx medically;  Last Myoview (2012): anterior infarct seen with scar, no ischemia. EF normal. Med Rx  // Myoview (12/14):  Low risk; ant scar w/ peri-infarct ischemia; EF 55% with ant AK // Myoview 5/16: EF 55-65, ant, apical scar, no ischemia   . Cataract   . Chronic back pain    HNP, DDD, spinal stenosis, vertebral compression fractures.   . Chronic nausea    takes Zofran daily as needed.  normal gastric emptying study 03/2013  . CKD (chronic kidney disease), stage III 09/27/2017  . Constipation    felt to be functional. takes Senokot and Miralax daily.  treated with Linzess in past.   . DIVERTICULOSIS, COLON 08/28/2008   Qualifier: Diagnosis of  By: [name], [name], [name]    . Elevated LFTs 2015   probably med induced DILI, from Augmentin vs  Pravastatin  . GERD (gastroesophageal reflux disease)    hx of esophageal stricture   . Hiatal hernia    Paraesophageal hernias. s/p fundoplications in 9147 and 04/2013  . History of bronchitis several of yrs ago  . History of colon polyps 03/2009, 03/2011   adenomatous colon polyps, no high grade dysplasia.   [name] History of vertigo    no meds  . Hyperlipidemia    takes Pravastatin daily  . Hypertension    takes Amlodipine,Metoprolol,and Losartan daily  . Nausea 03/2016  . Osteoporosis   . PONV (postoperative nausea and vomiting)    yrs ago  . Slow urinary stream    takes Rapaflo daily  . TIA (transient ischemic attack) 1995   no residual effects noted    Family History  Problem Relation Age of Onset  . Colon cancer Mother 58  . Heart attack Father 52       heart attack  . Brain cancer Brother        tumor   . Heart disease Brother   . Heart disease Brother   . Kidney disease Daughter   . Esophageal cancer Neg Hx   . Stomach cancer Neg Hx     Past Surgical History:  Procedure Laterality Date  . APPENDECTOMY     patient unsure of date  . BACK SURGERY    . BLADDER REPAIR    . CHOLECYSTECTOMY     Patient unsure of date.  . CORONARY ANGIOPLASTY  11/16/2001   1 stent  . ESOPHAGEAL MANOMETRY N/A 03/25/2013   Procedure: ESOPHAGEAL  MANOMETRY (EM);  Surgeon: Inda Castle, MD;  Location: [facility];  Service: Endoscopy;  Laterality: N/A;  . ESOPHAGOGASTRODUODENOSCOPY N/A 03/15/2013   Procedure: ESOPHAGOGASTRODUODENOSCOPY (EGD);  Surgeon: Jerene Bears, MD;  Location[facility] ENDOSCOPY;  Service: Endoscopy;  Laterality: N/A;  . ESOPHAGOGASTRODUODENOSCOPY N/A 12/25/2015   Procedure: ESOPHAGOGASTRODUODENOSCOPY (EGD);  Surgeon: Doran Stabler, MD;  Location: [facility] ENDOSCOPY;  Service: Endoscopy;  Laterality: N/A;  . EYE SURGERY  11/2000   bilateral cataracts with lens implant  . FIXATION KYPHOPLASTY LUMBAR SPINE  X 2   "L3-4"  . HEMORRHOID SURGERY    . HIATAL HERNIA REPAIR  06/03/2011   Procedure: LAPAROSCOPIC REPAIR OF HIATAL HERNIA;  Surgeon: Adin Hector, MD;  Location: [facility];  Service: General;  Laterality: N/A;  . HIATAL HERNIA REPAIR N/A 04/24/2013   Procedure: LAPAROSCOPIC  REPAIR RECURRENT PARASOPHAGEAL HIATAL HERNIA WITH FUNDOPLICATION;  Surgeon: Adin Hector, MD;  Location: [facility];  Service: General;  Laterality: N/A;  . INSERTION OF MESH N/A 04/24/2013   Procedure: INSERTION OF MESH;  Surgeon: Adin Hector, MD;  Location: [facility];  Service: General;  Laterality: N/A;  . IR KYPHO THORACIC WITH BONE BIOPSY  09/29/2017  . IR KYPHO THORACIC WITH BONE BIOPSY  11/03/2017  . IR KYPHO THORACIC WITH BONE BIOPSY  02/14/2019  . IR KYPHO THORACIC WITH BONE BIOPSY  04/02/2019  . LAPAROSCOPIC LYSIS OF ADHESIONS N/A 04/24/2013   Procedure: LAPAROSCOPIC LYSIS OF ADHESIONS;  Surgeon: Adin Hector, MD;  Location: [facility];  Service: General;  Laterality: N/A;  . LAPAROSCOPIC NISSEN FUNDOPLICATION  01/05/5620   Procedure: LAPAROSCOPIC NISSEN FUNDOPLICATION;  Surgeon: Adin Hector, MD;  Location: [facility];  Service: General;  Laterality: N/A;  . LUMBAR FUSION  12/07/2015   Right L2-3 facetectomy, posterolateral fusion L2-3 with pedicle screws, rods, local bone graft, Vivigen cancellous chips. Fusion extended to the L1 level with  pedicle screws and rods  . LUMBAR LAMINECTOMY/DECOMPRESSION MICRODISCECTOMY Right 06/26/2012   Procedure: LUMBAR LAMINECTOMY/DECOMPRESSION MICRODISCECTOMY;  Surgeon: Jessy Oto, MD;  Location: [facility];  Service: Orthopedics;  Laterality: Right;  RIGHT L4-5 MICRODISCECTOMY  . LUMBAR LAMINECTOMY/DECOMPRESSION MICRODISCECTOMY N/A 05/29/2015   Procedure: RIGHT L2-3 MICRODISCECTOMY;  Surgeon: Jessy Oto, MD;  Location: [place];  Service: Orthopedics;  Laterality: N/A;  . TOTAL ABDOMINAL HYSTERECTOMY  1975   partial  . UPPER GASTROINTESTINAL ENDOSCOPY     Social History   Occupational History  . Occupation: Retired    Fish farm manager: RETIRED    Comment: worked at [employer]  . Smoking status: Never Smoker  . Smokeless tobacco: Never Used  Vaping Use  . Vaping Use: Never used  Substance and Sexual Activity  . Alcohol use: No  . Drug use: Never  . Sexual activity: Not on file

## 2019-12-05 ENCOUNTER — Ambulatory Visit (INDEPENDENT_AMBULATORY_CARE_PROVIDER_SITE_OTHER): Payer: PPO | Admitting: Adult Health

## 2019-12-05 ENCOUNTER — Telehealth: Payer: Self-pay

## 2019-12-05 ENCOUNTER — Other Ambulatory Visit: Payer: Self-pay

## 2019-12-05 ENCOUNTER — Encounter: Payer: Self-pay | Admitting: Adult Health

## 2019-12-05 VITALS — BP 120/70 | HR 105 | Temp 96.8°F | Wt 163.0 lb

## 2019-12-05 DIAGNOSIS — W19XXXD Unspecified fall, subsequent encounter: Secondary | ICD-10-CM

## 2019-12-05 DIAGNOSIS — R0789 Other chest pain: Secondary | ICD-10-CM

## 2019-12-05 MED ORDER — PREDNISONE 20 MG PO TABS: ORAL_TABLET | ORAL | 0 refills | Status: DC

## 2019-12-05 NOTE — Progress Notes (Signed)
Assessment and Plan:  [name] [name] was seen today for fall and chest pain.  Diagnoses and all orders for this visit:  Chest wall pain Chest wall pain and breast bruising following fall 1 week ago; did have negative CXR with clear lungs and no fracture; she denies concerning resp sx.  She is chronic pain patient with extensive pain management agents as per below;  She prefers to avoid "strong narcotics" and stopped taking prescribed vicodin She may benefit from short course of prednisone  Discussed this pain may take several weeks to resolve, this is normal and expected Go to the ER if any worsening chest pain with shortness of breath, fast breathing, dizziness or racing heart, syncope -     predniSONE (DELTASONE) 20 MG tablet; 2 tablets daily for 3 days, 1 tablet daily for 4 days.  Further disposition pending results of labs. Discussed med's effects and SE's.   Over 15 minutes of exam, counseling, chart review, and critical decision making was performed.   Future Appointments  Date Time Provider [place]  12/13/2019 10:45 AM Suzan Slick, NP [facility] None  01/22/2020 10:30 AM Vicie Mutters, PA-C [facility] None  02/18/2020 10:15 AM [name], MD [facility] None  07/21/2020 10:30 AM Vicie Mutters, PA-C [facility] None  10/15/2020 10:00 AM Vicie Mutters, PA-C [facility] None    ------------------------------------------------------------------------------------------------------------------   HPI BP 120/70   Pulse 105   Temp (!) 96.8 F (36 C)   Wt 163 lb (73.9 kg)   SpO2 93%   BMI 29.81 kg/m   84 y.o.female presents for evalauation accompanied by her husband due to chest tenderness persistent following a fall 1 week ago on 7/22/201. She reports tripped on deck and fell forward, striking her R wrist and landed on her chest. She was seen by ortho the following day, had CXR, R wrist xrays which were negative for fracture. She was prescribed vicodin by ortho, took 4 tabs  but then realized this was a "strong opioid" and stopped taking. She called them yesterday to report ongoing chest aching and was advised to follow up with PCP.   She reports very sore and tender through chest and L breast with bruising, aching pain at rest, 7/10 but with movement having sharp pains in center of chest, 9-10/10. She denies cough, dyspnea, wheezing, able to get up and ambulate without distress.   She is a chronic pain patient, hx of vertebral augmentation and follows with pain management Dr. Nicholaus Bloom. She has been taking tylenol 1000 mg q6-8 hours and 100 mg QID. She is additionally on gabapentin 400 mg TID, cymbalta 60 mg, baclofen 5-10 mg BID, and has used topical voltaren and salon pas. Each with some benefit but reports still "very uncomfortable" and sleeping fitfully in a recliner. She did try topical ice but reports this caused more discomfort.    Lab Results  Component Value Date   GFRNONAA 68 10/16/2019    Past Medical History:  Diagnosis Date  . Anxiety    takes Xanax daily as needed  . Asthma    Albuterol daily as needed  . Blood transfusion without reported diagnosis   . CAD (coronary artery disease)    LHC 2003 with 40% pLAD, 30% mLAD, 100% D1 (moderate vessel), 100%  D2 (small vessel), 95% mid small OM2.  Pt had PTCA to D1;  D2 and OM2 small vessels and tx medically;  Last Myoview (2012): anterior infarct seen with scar, no ischemia. EF normal. Med Rx // Myoview (12/14):  Low risk; ant scar w/ peri-infarct ischemia; EF 55% with ant AK // Myoview 5/16: EF 55-65, ant, apical scar, no ischemia   . Cataract   . Chronic back pain    HNP, DDD, spinal stenosis, vertebral compression fractures.   . Chronic nausea    takes Zofran daily as needed.  normal gastric emptying study 03/2013  . CKD (chronic kidney disease), stage III 09/27/2017  . Constipation    felt to be functional. takes Senokot and Miralax daily.  treated with Linzess in past.   . DIVERTICULOSIS,  COLON 08/28/2008   Qualifier: Diagnosis of  By: [name], [name], [name]    . Elevated LFTs 2015   probably med induced DILI, from Augmentin vs Pravastatin  . GERD (gastroesophageal reflux disease)    hx of esophageal stricture   . Hiatal hernia    Paraesophageal hernias. s/p fundoplications in 8315 and 04/2013  . History of bronchitis several of yrs ago  . History of colon polyps 03/2009, 03/2011   adenomatous colon polyps, no high grade dysplasia.   [name] History of vertigo    no meds  . Hyperlipidemia    takes Pravastatin daily  . Hypertension    takes Amlodipine,Metoprolol,and Losartan daily  . Nausea 03/2016  . Osteoporosis   . PONV (postoperative nausea and vomiting)    yrs ago  . Slow urinary stream    takes Rapaflo daily  . TIA (transient ischemic attack) 1995   no residual effects noted     Allergies  Allergen Reactions  . Ace Inhibitors Cough    Per Dr Melvyn Novas pulmonology 2013  . Aspartame Diarrhea and Nausea And Vomiting  . Atorvastatin Other (See Comments)    Muscle aches  . Biaxin [Clarithromycin] Other (See Comments)    Unknown reaction : PATIENT CAN TOLERATE Z-PAK.  Is written on patient's paper chart.  [name] [Roflumilast] Nausea And Vomiting  . Erythromycin Other (See Comments)    Unknown reaction: PATIENT CAN TOLERATE Z-PAK.  It is written on patient's paper chart.  . Levofloxacin Nausea And Vomiting    Current Outpatient Medications on File Prior to Visit  Medication Sig  . acetaminophen (TYLENOL) 500 MG tablet Take 1 tablet (500 mg total) by mouth every 6 (six) hours as needed.  . ALPRAZolam (XANAX) 0.5 MG tablet Take 1/2 tablet 1- 2 x /day ONLY if needed for Anxiety Attack &  limit to 5 days /week to avoid Addiction - DO NOT take with Hydrocodone or Codeine (Patient taking differently: Take 0.25-0.5 mg by mouth daily as needed for anxiety. Take 1/2 tablet 1- 2 x /day ONLY if needed for Anxiety Attack &  limit to 5 days /week to avoid Addiction - DO NOT take  with Hydrocodone or Codeine)  . amLODipine (NORVASC) 10 MG tablet Take 1 tablet Daily for BP & Heart  . aspirin EC 81 MG tablet Take 81 mg by mouth at bedtime.   . baclofen (LIORESAL) 10 MG tablet Take 1/2 to 1 tablet 2 x day if needed for muscle spasm  . Cholecalciferol (VITAMIN D3) 5000 units CAPS Take 5,000 Units by mouth 2 (two) times daily.   [name] denosumab (PROLIA) 60 MG/ML SOSY injection Inject 60 mg into the skin every 6 (six) months.  . diclofenac sodium (VOLTAREN) 1 % GEL Apply 2 g topically 4 (four) times daily. (Patient taking differently: Apply 2 g topically 4 (four) times daily as needed (pain). )  . dicyclomine (BENTYL) 20 MG tablet Take 1 tablet 3  x  /day before meals for Cramping, Nausea or Diarhea  . DULoxetine (CYMBALTA) 30 MG capsule Start taking 1 cap daily, for mood and chronic pain; increase to 2 caps in 1 month if tolerating well.  . Fluticasone-Salmeterol (ADVAIR DISKUS) 250-50 MCG/DOSE AEPB Inhale 1 puff into the lungs in the morning and at bedtime.  . gabapentin (NEURONTIN) 800 MG tablet Take 1/2 to 1 tablet 3 x /day for Pain (Patient taking differently: Take 400 mg by mouth 3 (three) times daily. )  . ipratropium-albuterol (DUONEB) 0.5-2.5 (3) MG/3ML SOLN Take 3 mLs by nebulization every 6 (six) hours as needed (shortness of breath/wheezing).   [name] ketoconazole (NIZORAL) 2 % cream Apply 1 application topically 2 x /day  . Magnesium 400 MG TABS Take 400 mg by mouth 2 (two) times daily.   . meclizine (ANTIVERT) 25 MG tablet Take 1/2 to 1 tablet 2 to 3 x /day ONLY if needed  For Dizziness / Vertigo (Patient taking differently: Take 12.5-25 mg by mouth 3 (three) times daily as needed for dizziness. Take 1/2 to 1 tablet 2 to 3 x /day ONLY if needed  For Dizziness / Vertigo)  . metoprolol tartrate (LOPRESSOR) 50 MG tablet Take 1 tablet 2 x /day for BP & Heart (Patient taking differently: Take 50 mg by mouth 2 (two) times daily. Take 1 tablet 2 x /day for BP & Heart)  . Multiple  Vitamins-Minerals (MULTIVITAMIN WITH MINERALS) tablet Take 1 tablet by mouth daily.  . nitroGLYCERIN (NITROSTAT) 0.4 MG SL tablet Place 1 tablet (0.4 mg total) under the tongue every 5 (five) minutes as needed for chest pain.  [name] omeprazole (PRILOSEC) 40 MG capsule Take 1 capsule 2 x /day for Indigestion , Heartburn & Nausea (Patient taking differently: Take 40 mg by mouth 2 (two) times daily. Take 1 capsule 2 x /day for Indigestion , Heartburn & Nausea)  . oxybutynin (DITROPAN-XL) 10 MG 24 hr tablet Take 1 tablet at Bedtime for Bladder  . polyethylene glycol powder (GLYCOLAX/MIRALAX) powder Take 17 g by mouth 2 (two) times daily. (Patient taking differently: Take 17 g by mouth daily. )  . pravastatin (PRAVACHOL) 40 MG tablet Take 40 mg by mouth daily.  . prochlorperazine (COMPAZINE) 10 MG tablet Take 1 tablet 3 x /day before for Meals to Prevent Nausea  . promethazine (PHENERGAN) 25 MG tablet Take 1 tablet every 4 hours if needed for Nausea or Vomitting  . rOPINIRole (REQUIP) 4 MG tablet Take 1 tablet at Bedtime for Rest Legs  . senna-docusate (SENOKOT-S) 8.6-50 MG tablet Take 2 tablets by mouth 2 (two) times daily.  . sucralfate (CARAFATE) 1 g tablet Take 1 tablet 4 x /day before Meals & Bedtime for Indigestion & Heartburn  . traMADol (ULTRAM) 50 MG tablet Take 100 mg by mouth as needed.  . vitamin B-12 (CYANOCOBALAMIN) 1000 MCG tablet Take 1,000 mcg by mouth daily.  [name] amoxicillin-clavulanate (AUGMENTIN) 875-125 MG tablet Take 1 tablet by mouth 2 (two) times daily. 7 days  . HYDROcodone-acetaminophen (NORCO/VICODIN) 5-325 MG tablet Take 1 tablet by mouth every 4 (four) hours as needed for moderate pain. (Patient not taking: Reported on 12/05/2019)  . predniSONE (DELTASONE) 20 MG tablet 2 tablets daily for 3 days, 1 tablet daily for 4 days.   Current Facility-Administered Medications on File Prior to Visit  Medication  . calcitonin (salmon) (MIACALCIN/FORTICAL) nasal spray 1 spray    ROS: all  negative except above.   Physical Exam:  BP 120/70   Pulse  105   Temp (!) 96.8 F (36 C)   Wt 163 lb (73.9 kg)   SpO2 93%   BMI 29.81 kg/m   General Appearance: Well nourished, well dressed elderly female in no acute distress though intermittently whimpering and appears in pain.  Eyes: PERRLA, EOMs, conjunctiva no swelling or erythema Sinuses: No Frontal/maxillary tenderness ENT/Mouth: Ext aud canals clear, TMs without erythema, bulging. No erythema, swelling, or exudate on post pharynx.  Tonsils not swollen or erythematous. Hearing normal.  Neck: Supple Respiratory: Respiratory effort normal, BS equal bilaterally without rales, rhonchi, wheezing or stridor.  Cardio: RRR with no MRGs. Intact peripheral pulses without edema.  Abdomen: Soft, + BS.  Non tender, no guarding, rebound. Lymphatics: Non tender without lymphadenopathy.  Musculoskeletal: No obvious deformity, slow gait with walker, R wrist in brace with intact peripheral sensation and circulation. Generalized anterior chest wall tenderness Skin: Warm, dry without rashes, lesions; L breast with moderate ecchymosis without lump.  Neuro: Cranial nerves intact. Normal muscle tone, Sensation intact.  Psych: Awake and oriented X 3, normal affect, Insight and Judgment appropriate.     Izora Ribas, NP 3:59 PM [facility]

## 2019-12-05 NOTE — Telephone Encounter (Signed)
Patient would like to be worked in to see Dr. Ninfa Linden for chest pain. I did advise that Dr. Ninfa Linden and Artis Delay did not have anything available this afternoon.  Patient was seen last week with Theodosia Paling.  Cb# [phone].  Please advise.

## 2019-12-05 NOTE — Patient Instructions (Addendum)
Chest Wall Pain Chest wall pain is pain in or around the bones and muscles of your chest. Sometimes, an injury causes this pain. Excessive coughing or overuse of arm and chest muscles may also cause chest wall pain. Sometimes, the cause may not be known. This pain may take several weeks or longer to get better. Follow these instructions at home: Managing pain, stiffness, and swelling   If directed, put ice on the painful area: ? Put ice in a plastic bag. ? Place a towel between your skin and the bag. ? Leave the ice on for 20 minutes, 2-3 times per day. Activity  Rest as told by your health care provider.  Avoid activities that cause pain. These include any activities that use your chest muscles or your abdominal and side muscles to lift heavy items. Ask your health care provider what activities are safe for you. General instructions   Take over-the-counter and prescription medicines only as told by your health care provider.  Do not use any products that contain nicotine or tobacco, such as cigarettes, e-cigarettes, and chewing tobacco. These can delay healing after injury. If you need help quitting, ask your health care provider.  Keep all follow-up visits as told by your health care provider. This is important. Contact a health care provider if:  You have a fever.  Your chest pain becomes worse.  You have new symptoms. Get help right away if:  You have nausea or vomiting.  You feel sweaty or light-headed.  You have a cough with mucus from your lungs (sputum) or you cough up blood.  You develop shortness of breath. These symptoms may represent a serious problem that is an emergency. Do not wait to see if the symptoms will go away. Get medical help right away. Call your local emergency services (911 in the [place] Do not drive yourself to the hospital. Summary  Chest wall pain is pain in or around the bones and muscles of your chest.  Depending on the cause, it may be  treated with ice, rest, medicines, and avoiding activities that cause pain.  Contact a health care provider if you have a fever, worsening chest pain, or new symptoms.  Get help right away if you feel light-headed or you develop shortness of breath. These symptoms may be an emergency. This information is not intended to replace advice given to you by your health care provider. Make sure you discuss any questions you have with your health care provider. Document Revised: 10/26/2017 Document Reviewed: 10/26/2017 Elsevier Patient Education  2020 [employer]  

## 2019-12-05 NOTE — Telephone Encounter (Signed)
Patient states she thinks the chest pain is coming from her fall but I  told her to please call her PCP just incase since its her chest

## 2019-12-10 ENCOUNTER — Other Ambulatory Visit: Payer: Self-pay | Admitting: Adult Health Nurse Practitioner

## 2019-12-10 ENCOUNTER — Telehealth: Payer: Self-pay

## 2019-12-10 DIAGNOSIS — W19XXXD Unspecified fall, subsequent encounter: Secondary | ICD-10-CM

## 2019-12-10 DIAGNOSIS — R0789 Other chest pain: Secondary | ICD-10-CM

## 2019-12-10 MED ORDER — PREDNISONE 20 MG PO TABS: ORAL_TABLET | ORAL | 0 refills | Status: DC

## 2019-12-10 NOTE — Telephone Encounter (Signed)
Patient states that she has 2 tablets left of Prednisone. Feeling better, but not like she should yet. Requesting another round of Prednisone.

## 2019-12-10 NOTE — Telephone Encounter (Signed)
Patient notified

## 2019-12-11 DIAGNOSIS — N3946 Mixed incontinence: Secondary | ICD-10-CM | POA: Diagnosis not present

## 2019-12-11 DIAGNOSIS — N311 Reflex neuropathic bladder, not elsewhere classified: Secondary | ICD-10-CM | POA: Diagnosis not present

## 2019-12-13 ENCOUNTER — Ambulatory Visit: Payer: PPO | Admitting: Family

## 2019-12-20 ENCOUNTER — Encounter (HOSPITAL_COMMUNITY): Payer: Self-pay

## 2019-12-20 ENCOUNTER — Inpatient Hospital Stay (HOSPITAL_COMMUNITY)
Admission: EM | Admit: 2019-12-20 | Discharge: 2019-12-31 | DRG: 543 | Disposition: A | Discharge status: Skilled Nursing Facility | Payer: PPO | Attending: Internal Medicine | Admitting: Internal Medicine

## 2019-12-20 ENCOUNTER — Other Ambulatory Visit: Payer: Self-pay

## 2019-12-20 ENCOUNTER — Emergency Department (HOSPITAL_COMMUNITY): Payer: PPO

## 2019-12-20 DIAGNOSIS — Y92019 Unspecified place in single-family (private) house as the place of occurrence of the external cause: Secondary | ICD-10-CM

## 2019-12-20 DIAGNOSIS — K5900 Constipation, unspecified: Secondary | ICD-10-CM | POA: Diagnosis not present

## 2019-12-20 DIAGNOSIS — S32599A Other specified fracture of unspecified pubis, initial encounter for closed fracture: Secondary | ICD-10-CM | POA: Diagnosis present

## 2019-12-20 DIAGNOSIS — M25572 Pain in left ankle and joints of left foot: Secondary | ICD-10-CM | POA: Diagnosis not present

## 2019-12-20 DIAGNOSIS — Z79899 Other long term (current) drug therapy: Secondary | ICD-10-CM

## 2019-12-20 DIAGNOSIS — K449 Diaphragmatic hernia without obstruction or gangrene: Secondary | ICD-10-CM | POA: Diagnosis not present

## 2019-12-20 DIAGNOSIS — F419 Anxiety disorder, unspecified: Secondary | ICD-10-CM | POA: Diagnosis not present

## 2019-12-20 DIAGNOSIS — E1122 Type 2 diabetes mellitus with diabetic chronic kidney disease: Secondary | ICD-10-CM | POA: Diagnosis not present

## 2019-12-20 DIAGNOSIS — Z7952 Long term (current) use of systemic steroids: Secondary | ICD-10-CM

## 2019-12-20 DIAGNOSIS — K5730 Diverticulosis of large intestine without perforation or abscess without bleeding: Secondary | ICD-10-CM | POA: Diagnosis not present

## 2019-12-20 DIAGNOSIS — E871 Hypo-osmolality and hyponatremia: Secondary | ICD-10-CM | POA: Diagnosis not present

## 2019-12-20 DIAGNOSIS — Z7982 Long term (current) use of aspirin: Secondary | ICD-10-CM

## 2019-12-20 DIAGNOSIS — B3781 Candidal esophagitis: Secondary | ICD-10-CM | POA: Diagnosis not present

## 2019-12-20 DIAGNOSIS — G8911 Acute pain due to trauma: Secondary | ICD-10-CM

## 2019-12-20 DIAGNOSIS — R450 Nervousness: Secondary | ICD-10-CM

## 2019-12-20 DIAGNOSIS — S3282XD Multiple fractures of pelvis without disruption of pelvic ring, subsequent encounter for fracture with routine healing: Secondary | ICD-10-CM | POA: Diagnosis not present

## 2019-12-20 DIAGNOSIS — Z9889 Other specified postprocedural states: Secondary | ICD-10-CM | POA: Diagnosis not present

## 2019-12-20 DIAGNOSIS — J90 Pleural effusion, not elsewhere classified: Secondary | ICD-10-CM | POA: Diagnosis not present

## 2019-12-20 DIAGNOSIS — R7302 Impaired glucose tolerance (oral): Secondary | ICD-10-CM | POA: Diagnosis not present

## 2019-12-20 DIAGNOSIS — K838 Other specified diseases of biliary tract: Secondary | ICD-10-CM | POA: Diagnosis not present

## 2019-12-20 DIAGNOSIS — Z20822 Contact with and (suspected) exposure to covid-19: Secondary | ICD-10-CM | POA: Diagnosis not present

## 2019-12-20 DIAGNOSIS — R0902 Hypoxemia: Secondary | ICD-10-CM | POA: Diagnosis not present

## 2019-12-20 DIAGNOSIS — I129 Hypertensive chronic kidney disease with stage 1 through stage 4 chronic kidney disease, or unspecified chronic kidney disease: Secondary | ICD-10-CM | POA: Diagnosis present

## 2019-12-20 DIAGNOSIS — R5381 Other malaise: Secondary | ICD-10-CM | POA: Diagnosis not present

## 2019-12-20 DIAGNOSIS — R7989 Other specified abnormal findings of blood chemistry: Secondary | ICD-10-CM

## 2019-12-20 DIAGNOSIS — K440 Diaphragmatic hernia with obstruction, without gangrene: Secondary | ICD-10-CM | POA: Diagnosis not present

## 2019-12-20 DIAGNOSIS — M545 Low back pain: Secondary | ICD-10-CM | POA: Diagnosis not present

## 2019-12-20 DIAGNOSIS — W109XXA Fall (on) (from) unspecified stairs and steps, initial encounter: Secondary | ICD-10-CM | POA: Diagnosis present

## 2019-12-20 DIAGNOSIS — Z955 Presence of coronary angioplasty implant and graft: Secondary | ICD-10-CM

## 2019-12-20 DIAGNOSIS — J45909 Unspecified asthma, uncomplicated: Secondary | ICD-10-CM | POA: Diagnosis present

## 2019-12-20 DIAGNOSIS — K567 Ileus, unspecified: Secondary | ICD-10-CM | POA: Diagnosis not present

## 2019-12-20 DIAGNOSIS — Z881 Allergy status to other antibiotic agents status: Secondary | ICD-10-CM

## 2019-12-20 DIAGNOSIS — I2510 Atherosclerotic heart disease of native coronary artery without angina pectoris: Secondary | ICD-10-CM | POA: Diagnosis present

## 2019-12-20 DIAGNOSIS — Z888 Allergy status to other drugs, medicaments and biological substances status: Secondary | ICD-10-CM

## 2019-12-20 DIAGNOSIS — E785 Hyperlipidemia, unspecified: Secondary | ICD-10-CM | POA: Diagnosis not present

## 2019-12-20 DIAGNOSIS — Z981 Arthrodesis status: Secondary | ICD-10-CM | POA: Diagnosis not present

## 2019-12-20 DIAGNOSIS — Z808 Family history of malignant neoplasm of other organs or systems: Secondary | ICD-10-CM | POA: Diagnosis not present

## 2019-12-20 DIAGNOSIS — G8929 Other chronic pain: Secondary | ICD-10-CM | POA: Diagnosis present

## 2019-12-20 DIAGNOSIS — K6389 Other specified diseases of intestine: Secondary | ICD-10-CM | POA: Diagnosis not present

## 2019-12-20 DIAGNOSIS — Z8673 Personal history of transient ischemic attack (TIA), and cerebral infarction without residual deficits: Secondary | ICD-10-CM | POA: Diagnosis not present

## 2019-12-20 DIAGNOSIS — N281 Cyst of kidney, acquired: Secondary | ICD-10-CM | POA: Diagnosis not present

## 2019-12-20 DIAGNOSIS — K7689 Other specified diseases of liver: Secondary | ICD-10-CM | POA: Diagnosis not present

## 2019-12-20 DIAGNOSIS — R52 Pain, unspecified: Secondary | ICD-10-CM | POA: Diagnosis not present

## 2019-12-20 DIAGNOSIS — R2689 Other abnormalities of gait and mobility: Secondary | ICD-10-CM | POA: Diagnosis not present

## 2019-12-20 DIAGNOSIS — D72829 Elevated white blood cell count, unspecified: Secondary | ICD-10-CM | POA: Diagnosis not present

## 2019-12-20 DIAGNOSIS — F29 Unspecified psychosis not due to a substance or known physiological condition: Secondary | ICD-10-CM | POA: Diagnosis not present

## 2019-12-20 DIAGNOSIS — Z8249 Family history of ischemic heart disease and other diseases of the circulatory system: Secondary | ICD-10-CM

## 2019-12-20 DIAGNOSIS — Z7401 Bed confinement status: Secondary | ICD-10-CM | POA: Diagnosis not present

## 2019-12-20 DIAGNOSIS — S32599D Other specified fracture of unspecified pubis, subsequent encounter for fracture with routine healing: Secondary | ICD-10-CM | POA: Diagnosis not present

## 2019-12-20 DIAGNOSIS — Z800 Family history of malignant neoplasm of digestive organs: Secondary | ICD-10-CM | POA: Diagnosis not present

## 2019-12-20 DIAGNOSIS — M6281 Muscle weakness (generalized): Secondary | ICD-10-CM | POA: Diagnosis not present

## 2019-12-20 DIAGNOSIS — R41841 Cognitive communication deficit: Secondary | ICD-10-CM | POA: Diagnosis not present

## 2019-12-20 DIAGNOSIS — R079 Chest pain, unspecified: Secondary | ICD-10-CM | POA: Diagnosis not present

## 2019-12-20 DIAGNOSIS — S32810A Multiple fractures of pelvis with stable disruption of pelvic ring, initial encounter for closed fracture: Secondary | ICD-10-CM | POA: Diagnosis not present

## 2019-12-20 DIAGNOSIS — E1136 Type 2 diabetes mellitus with diabetic cataract: Secondary | ICD-10-CM | POA: Diagnosis not present

## 2019-12-20 DIAGNOSIS — M84454D Pathological fracture, pelvis, subsequent encounter for fracture with routine healing: Secondary | ICD-10-CM | POA: Diagnosis not present

## 2019-12-20 DIAGNOSIS — S32591A Other specified fracture of right pubis, initial encounter for closed fracture: Secondary | ICD-10-CM | POA: Diagnosis not present

## 2019-12-20 DIAGNOSIS — R112 Nausea with vomiting, unspecified: Secondary | ICD-10-CM | POA: Diagnosis not present

## 2019-12-20 DIAGNOSIS — R279 Unspecified lack of coordination: Secondary | ICD-10-CM | POA: Diagnosis not present

## 2019-12-20 DIAGNOSIS — T380X5A Adverse effect of glucocorticoids and synthetic analogues, initial encounter: Secondary | ICD-10-CM | POA: Diagnosis not present

## 2019-12-20 DIAGNOSIS — M84454S Pathological fracture, pelvis, sequela: Secondary | ICD-10-CM | POA: Diagnosis not present

## 2019-12-20 DIAGNOSIS — F411 Generalized anxiety disorder: Secondary | ICD-10-CM | POA: Diagnosis not present

## 2019-12-20 DIAGNOSIS — S3282XA Multiple fractures of pelvis without disruption of pelvic ring, initial encounter for closed fracture: Secondary | ICD-10-CM

## 2019-12-20 DIAGNOSIS — N183 Chronic kidney disease, stage 3 unspecified: Secondary | ICD-10-CM | POA: Diagnosis not present

## 2019-12-20 DIAGNOSIS — I252 Old myocardial infarction: Secondary | ICD-10-CM | POA: Diagnosis not present

## 2019-12-20 DIAGNOSIS — I10 Essential (primary) hypertension: Secondary | ICD-10-CM | POA: Diagnosis not present

## 2019-12-20 DIAGNOSIS — Z7951 Long term (current) use of inhaled steroids: Secondary | ICD-10-CM

## 2019-12-20 DIAGNOSIS — M8000XD Age-related osteoporosis with current pathological fracture, unspecified site, subsequent encounter for fracture with routine healing: Secondary | ICD-10-CM

## 2019-12-20 DIAGNOSIS — M2550 Pain in unspecified joint: Secondary | ICD-10-CM | POA: Diagnosis not present

## 2019-12-20 DIAGNOSIS — S3282XS Multiple fractures of pelvis without disruption of pelvic ring, sequela: Secondary | ICD-10-CM | POA: Diagnosis not present

## 2019-12-20 DIAGNOSIS — K219 Gastro-esophageal reflux disease without esophagitis: Secondary | ICD-10-CM | POA: Diagnosis present

## 2019-12-20 DIAGNOSIS — R109 Unspecified abdominal pain: Secondary | ICD-10-CM

## 2019-12-20 DIAGNOSIS — M25551 Pain in right hip: Secondary | ICD-10-CM | POA: Diagnosis not present

## 2019-12-20 DIAGNOSIS — M80051A Age-related osteoporosis with current pathological fracture, right femur, initial encounter for fracture: Secondary | ICD-10-CM | POA: Diagnosis not present

## 2019-12-20 DIAGNOSIS — R1310 Dysphagia, unspecified: Secondary | ICD-10-CM | POA: Diagnosis not present

## 2019-12-20 DIAGNOSIS — R2681 Unsteadiness on feet: Secondary | ICD-10-CM | POA: Diagnosis not present

## 2019-12-20 DIAGNOSIS — E782 Mixed hyperlipidemia: Secondary | ICD-10-CM | POA: Diagnosis not present

## 2019-12-20 DIAGNOSIS — S32591D Other specified fracture of right pubis, subsequent encounter for fracture with routine healing: Secondary | ICD-10-CM | POA: Diagnosis not present

## 2019-12-20 DIAGNOSIS — I517 Cardiomegaly: Secondary | ICD-10-CM | POA: Diagnosis not present

## 2019-12-20 DIAGNOSIS — M4326 Fusion of spine, lumbar region: Secondary | ICD-10-CM

## 2019-12-20 HISTORY — DX: Other specified fracture of unspecified pubis, initial encounter for closed fracture: S32.599A

## 2019-12-20 LAB — CBC WITH DIFFERENTIAL/PLATELET
Abs Immature Granulocytes: 0.29 10*3/uL — ABNORMAL HIGH (ref 0.00–0.07)
Basophils Absolute: 0.1 10*3/uL (ref 0.0–0.1)
Basophils Relative: 0 %
Eosinophils Absolute: 0 10*3/uL (ref 0.0–0.5)
Eosinophils Relative: 0 %
HCT: 41.7 % (ref 36.0–46.0)
Hemoglobin: 13.6 g/dL (ref 12.0–15.0)
Immature Granulocytes: 1 %
Lymphocytes Relative: 9 %
Lymphs Abs: 2.1 10*3/uL (ref 0.7–4.0)
MCH: 31.1 pg (ref 26.0–34.0)
MCHC: 32.6 g/dL (ref 30.0–36.0)
MCV: 95.2 fL (ref 80.0–100.0)
Monocytes Absolute: 1.4 10*3/uL — ABNORMAL HIGH (ref 0.1–1.0)
Monocytes Relative: 6 %
Neutro Abs: 19.3 10*3/uL — ABNORMAL HIGH (ref 1.7–7.7)
Neutrophils Relative %: 84 %
Platelets: 416 10*3/uL — ABNORMAL HIGH (ref 150–400)
RBC: 4.38 MIL/uL (ref 3.87–5.11)
RDW: 14.5 % (ref 11.5–15.5)
WBC: 23.1 10*3/uL — ABNORMAL HIGH (ref 4.0–10.5)
nRBC: 0 % (ref 0.0–0.2)

## 2019-12-20 LAB — URINALYSIS, ROUTINE W REFLEX MICROSCOPIC
Bilirubin Urine: NEGATIVE
Glucose, UA: NEGATIVE mg/dL
Hgb urine dipstick: NEGATIVE
Ketones, ur: NEGATIVE mg/dL
Leukocytes,Ua: NEGATIVE
Nitrite: NEGATIVE
Protein, ur: NEGATIVE mg/dL
Specific Gravity, Urine: 1.013 (ref 1.005–1.030)
pH: 7 (ref 5.0–8.0)

## 2019-12-20 LAB — BASIC METABOLIC PANEL
Anion gap: 10 (ref 5–15)
BUN: 22 mg/dL (ref 8–23)
CO2: 23 mmol/L (ref 22–32)
Calcium: 8.4 mg/dL — ABNORMAL LOW (ref 8.9–10.3)
Chloride: 105 mmol/L (ref 98–111)
Creatinine, Ser: 0.73 mg/dL (ref 0.44–1.00)
GFR calc Af Amer: >60 mL/min (ref >60)
GFR calc non Af Amer: >60 mL/min (ref >60)
Glucose, Bld: 91 mg/dL (ref 70–99)
Potassium: 3.8 mmol/L (ref 3.5–5.1)
Sodium: 138 mmol/L (ref 135–145)

## 2019-12-20 LAB — TROPONIN I (HIGH SENSITIVITY): Troponin I (High Sensitivity): 6 ng/L (ref <18)

## 2019-12-20 LAB — SARS CORONAVIRUS 2 BY RT PCR (HOSPITAL ORDER, PERFORMED IN ~~LOC~~ HOSPITAL LAB): SARS Coronavirus 2: NEGATIVE

## 2019-12-20 MED ORDER — HYDROCODONE-ACETAMINOPHEN 5-325 MG PO TABS: 1.0000 | ORAL_TABLET | Freq: Four times a day (QID) | ORAL | Status: DC | PRN

## 2019-12-20 MED ORDER — KETOROLAC TROMETHAMINE 30 MG/ML IJ SOLN
15.0000 mg | Freq: Once | INTRAMUSCULAR | Status: AC
  Administered 2019-12-20: 15 mg via INTRAVENOUS
  Filled 2019-12-20: qty 1

## 2019-12-20 MED ORDER — ONDANSETRON HCL 4 MG/2ML IJ SOLN
4.0000 mg | Freq: Once | INTRAMUSCULAR | Status: AC
  Administered 2019-12-20: 4 mg via INTRAVENOUS
  Filled 2019-12-20: qty 2

## 2019-12-20 MED ORDER — MORPHINE SULFATE (PF) 4 MG/ML IV SOLN
4.0000 mg | INTRAVENOUS | Status: DC | PRN
  Administered 2019-12-21 – 2019-12-22 (×4): 4 mg via INTRAVENOUS
  Filled 2019-12-20 (×4): qty 1

## 2019-12-20 MED ORDER — MORPHINE SULFATE (PF) 4 MG/ML IV SOLN
4.0000 mg | Freq: Once | INTRAVENOUS | Status: AC
  Administered 2019-12-20: 4 mg via INTRAVENOUS
  Filled 2019-12-20: qty 1

## 2019-12-20 MED ORDER — HYDROCODONE-ACETAMINOPHEN 7.5-325 MG PO TABS
1.0000 | ORAL_TABLET | Freq: Four times a day (QID) | ORAL | Status: DC | PRN
  Administered 2019-12-21: 1 via ORAL
  Filled 2019-12-20: qty 1

## 2019-12-20 NOTE — H&P (Signed)
TRH H&P    Patient Demographics:    [name] [name], is a 84 y.o. female  MRN: [id number]  DOB - 17-Nov-1935  Admit Date - 12/20/2019  Referring MD/NP/PA: Karle Starch  Outpatient Primary MD for the patient is [name], MD  Patient coming from: Home  Chief complaint- Fall with hip pain   HPI:    [name] [name]  is a 84 y.o. female, TIA, hypertension, hyperlipidemia, GERD, CKD 3, CAD, anxiety, and more presents to the ED with chief complaint of fall.  Patient reports that she was going up stairs steps at her home with a glass of tea, and the next and she knew she was on the ground.  Patient reports that she landed on her right hip, left fifth digit, on cement ground.  She had instant pain and required the help of 2 people to get up to a chair until the paramedics came.  Patient reports that she has otherwise been in her normal state of health.  Prior to the fall she does not remember any chest pain, dyspnea, palpitations, or dizziness.  She reports that she does not think she lost consciousness.  She did not hit her head.  Patient is not on any blood thinners.  Patient does not remember tripping, but it was about a 6 inch tall step that she was attempting to navigate when she fell.  Patient reports that her pain was 10 out of 10.  Came down with treatment in the ER.  Her pain is creeping back up to 7-8 out of 10 at the time of my exam.  She describes it as sharp and achy.  Patient has no other complaints at this time.  Patient denies chest pain at this time - despite current treatment outpatient for "chest wall pain." Per chart review - this pain was subsequent to a fall one week ago. She did have a negative CXR with clear lungs at that time. Patient preferred to avoid "strong narcotics" at that time, and so short term prednisone was ordered. Near to the end of her course, patient reported that she felt better, but not 100%,  so she requested a second course of prednisone. The first started Jul29, the second Aug 3.   In the ED Temperature 98.5, blood pressure 157/74, 95% on room air, 72 heart rate, respirations 11 Leukocytosis of 23.1-patient has been on prednisone in the outpatient setting for "chest wall pain." Covid pending UA negative CT shows fractures through right inferior pubic ramus and right ischium/anteromedial acetabulum Patient was given Toradol 30, morphine 4, Zofran 4 troponin   Review of systems:    Review of Systems  Constitutional: Negative for chills, fever and malaise/fatigue.  HENT: Negative for congestion, nosebleeds and sore throat.   Eyes: Negative for blurred vision, double vision, pain and discharge.  Respiratory: Negative for cough, sputum production and wheezing.   Cardiovascular: Negative for chest pain, palpitations and leg swelling.  Gastrointestinal: Negative for abdominal pain, diarrhea, nausea and vomiting.  Genitourinary: Negative for dysuria, frequency and urgency.  Musculoskeletal: Positive for  falls. Negative for myalgias and neck pain.  Skin: Negative for rash.  Neurological: Negative for dizziness and weakness.  Psychiatric/Behavioral: Negative for substance abuse.    All other systems reviewed and are negative.    Past History of the following :    Past Medical History:  Diagnosis Date  . Anxiety    takes Xanax daily as needed  . Asthma    Albuterol daily as needed  . Blood transfusion without reported diagnosis   . CAD (coronary artery disease)    LHC 2003 with 40% pLAD, 30% mLAD, 100% D1 (moderate vessel), 100%  D2 (small vessel), 95% mid small OM2.  Pt had PTCA to D1;  D2 and OM2 small vessels and tx medically;  Last Myoview (2012): anterior infarct seen with scar, no ischemia. EF normal. Med Rx // Myoview (12/14):  Low risk; ant scar w/ peri-infarct ischemia; EF 55% with ant AK // Myoview 5/16: EF 55-65, ant, apical scar, no ischemia   . Cataract   .  Chronic back pain    HNP, DDD, spinal stenosis, vertebral compression fractures.   . Chronic nausea    takes Zofran daily as needed.  normal gastric emptying study 03/2013  . CKD (chronic kidney disease), stage III 09/27/2017  . Constipation    felt to be functional. takes Senokot and Miralax daily.  treated with Linzess in past.   . DIVERTICULOSIS, COLON 08/28/2008   Qualifier: Diagnosis of  By: [name], [name], [name]    . Elevated LFTs 2015   probably med induced DILI, from Augmentin vs Pravastatin  . GERD (gastroesophageal reflux disease)    hx of esophageal stricture   . Hiatal hernia    Paraesophageal hernias. s/p fundoplications in 8315 and 04/2013  . History of bronchitis several of yrs ago  . History of colon polyps 03/2009, 03/2011   adenomatous colon polyps, no high grade dysplasia.   [name] History of vertigo    no meds  . Hyperlipidemia    takes Pravastatin daily  . Hypertension    takes Amlodipine,Metoprolol,and Losartan daily  . Nausea 03/2016  . Osteoporosis   . PONV (postoperative nausea and vomiting)    yrs ago  . Slow urinary stream    takes Rapaflo daily  . TIA (transient ischemic attack) 1995   no residual effects noted      Past Surgical History:  Procedure Laterality Date  . APPENDECTOMY     patient unsure of date  . BACK SURGERY    . BLADDER REPAIR    . CHOLECYSTECTOMY     Patient unsure of date.  . CORONARY ANGIOPLASTY  11/16/2001   1 stent  . ESOPHAGEAL MANOMETRY N/A 03/25/2013   Procedure: ESOPHAGEAL MANOMETRY (EM);  Surgeon: Inda Castle, MD;  Location: [facility];  Service: Endoscopy;  Laterality: N/A;  . ESOPHAGOGASTRODUODENOSCOPY N/A 03/15/2013   Procedure: ESOPHAGOGASTRODUODENOSCOPY (EGD);  Surgeon: Jerene Bears, MD;  Location: [facility] ENDOSCOPY;  Service: Endoscopy;  Laterality: N/A;  . ESOPHAGOGASTRODUODENOSCOPY N/A 12/25/2015   Procedure: ESOPHAGOGASTRODUODENOSCOPY (EGD);  Surgeon: Doran Stabler, MD;  Location: [facility] ENDOSCOPY;  Service:  Endoscopy;  Laterality: N/A;  . EYE SURGERY  11/2000   bilateral cataracts with lens implant  . FIXATION KYPHOPLASTY LUMBAR SPINE  X 2   "L3-4"  . HEMORRHOID SURGERY    . HIATAL HERNIA REPAIR  06/03/2011   Procedure: LAPAROSCOPIC REPAIR OF HIATAL HERNIA;  Surgeon: Adin Hector, MD;  Location: [facility] ORS;  Service: General;  Laterality: N/A;  .  HIATAL HERNIA REPAIR N/A 04/24/2013   Procedure: LAPAROSCOPIC  REPAIR RECURRENT PARASOPHAGEAL HIATAL HERNIA WITH FUNDOPLICATION;  Surgeon: Adin Hector, MD;  Location: [facility];  Service: General;  Laterality: N/A;  . INSERTION OF MESH N/A 04/24/2013   Procedure: INSERTION OF MESH;  Surgeon: Adin Hector, MD;  Location: [facility] ORS;  Service: General;  Laterality: N/A;  . IR KYPHO THORACIC WITH BONE BIOPSY  09/29/2017  . IR KYPHO THORACIC WITH BONE BIOPSY  11/03/2017  . IR KYPHO THORACIC WITH BONE BIOPSY  02/14/2019  . IR KYPHO THORACIC WITH BONE BIOPSY  04/02/2019  . LAPAROSCOPIC LYSIS OF ADHESIONS N/A 04/24/2013   Procedure: LAPAROSCOPIC LYSIS OF ADHESIONS;  Surgeon: Adin Hector, MD;  Location: [facility];  Service: General;  Laterality: N/A;  . LAPAROSCOPIC NISSEN FUNDOPLICATION  3/53/6144   Procedure: LAPAROSCOPIC NISSEN FUNDOPLICATION;  Surgeon: Adin Hector, MD;  Location: [facility];  Service: General;  Laterality: N/A;  . LUMBAR FUSION  12/07/2015   Right L2-3 facetectomy, posterolateral fusion L2-3 with pedicle screws, rods, local bone graft, Vivigen cancellous chips. Fusion extended to the L1 level with pedicle screws and rods  . LUMBAR LAMINECTOMY/DECOMPRESSION MICRODISCECTOMY Right 06/26/2012   Procedure: LUMBAR LAMINECTOMY/DECOMPRESSION MICRODISCECTOMY;  Surgeon: Jessy Oto, MD;  Location: [place];  Service: Orthopedics;  Laterality: Right;  RIGHT L4-5 MICRODISCECTOMY  . LUMBAR LAMINECTOMY/DECOMPRESSION MICRODISCECTOMY N/A 05/29/2015   Procedure: RIGHT L2-3 MICRODISCECTOMY;  Surgeon: Jessy Oto, MD;  Location: [place];  Service: Orthopedics;   Laterality: N/A;  . TOTAL ABDOMINAL HYSTERECTOMY  1975   partial  . UPPER GASTROINTESTINAL ENDOSCOPY        Social History:      Social History   Tobacco Use  . Smoking status: Never Smoker  . Smokeless tobacco: Never Used  Substance Use Topics  . Alcohol use: No       Family History :     Family History  Problem Relation Age of Onset  . Colon cancer Mother 44  . Heart attack Father 51       heart attack  . Brain cancer Brother        tumor   . Heart disease Brother   . Heart disease Brother   . Kidney disease Daughter   . Esophageal cancer Neg Hx   . Stomach cancer Neg Hx       Home Medications:   Prior to Admission medications   Medication Sig Start Date End Date Taking? Authorizing Provider  acetaminophen (TYLENOL) 500 MG tablet Take 1 tablet (500 mg total) by mouth every 6 (six) hours as needed. Patient taking differently: Take 500 mg by mouth every 6 (six) hours as needed for moderate pain.  03/18/18  Yes Bast, Traci A, NP  ALPRAZolam (XANAX) 0.5 MG tablet Take 1/2 tablet 1- 2 x /day ONLY if needed for Anxiety Attack &  limit to 5 days /week to avoid Addiction - DO NOT take with Hydrocodone or Codeine Patient taking differently: Take 0.25-0.5 mg by mouth daily as needed for anxiety. Take 1/2 tablet 1- 2 x /day ONLY if needed for Anxiety Attack &  limit to 5 days /week to avoid Addiction - DO NOT take with Hydrocodone or Codeine 04/20/19  Yes [name], MD  amLODipine (NORVASC) 10 MG tablet Take 1 tablet Daily for BP & Heart 02/07/19  Yes [name], MD  aspirin EC 81 MG tablet Take 81 mg by mouth at bedtime.    Yes [provider]  Cholecalciferol (VITAMIN D3) 5000 units CAPS Take 5,000 Units by mouth 2 (two) times daily.    Yes [provider]  denosumab (PROLIA) 60 MG/ML SOSY injection Inject 60 mg into the skin every 6 (six) months. 08/20/19  Yes Deveshwar, Abel Presto, MD  diclofenac sodium (VOLTAREN) 1 % GEL Apply 2 g topically 4  (four) times daily. Patient taking differently: Apply 2 g topically 4 (four) times daily as needed (pain).  03/18/18  Yes Bast, Traci A, NP  Fluticasone-Salmeterol (ADVAIR DISKUS) 250-50 MCG/DOSE AEPB Inhale 1 puff into the lungs in the morning and at bedtime. 10/16/19  Yes Vicie Mutters, PA-C  gabapentin (NEURONTIN) 300 MG capsule Take 300 mg by mouth 4 (four) times daily as needed (pain).  12/18/19  Yes [provider]  Glycopyrrolate-Formoterol (BEVESPI AEROSPHERE) 9-4.8 MCG/ACT AERO Inhale 1 puff into the lungs daily.   Yes [provider]  ipratropium-albuterol (DUONEB) 0.5-2.5 (3) MG/3ML SOLN Take 3 mLs by nebulization every 6 (six) hours as needed (shortness of breath/wheezing).    Yes [provider]  ketoconazole (NIZORAL) 2 % cream Apply 1 application topically 2 x /day Patient taking differently: Apply 1 application topically 2 (two) times daily. Apply 1 application topically 2 x /day 11/28/19  Yes [name], MD  Magnesium 400 MG TABS Take 400 mg by mouth 2 (two) times daily.    Yes [provider]  meclizine (ANTIVERT) 25 MG tablet Take 1/2 to 1 tablet 2 to 3 x /day ONLY if needed  For Dizziness / Vertigo Patient taking differently: Take 12.5-25 mg by mouth 3 (three) times daily as needed for dizziness. Take 1/2 to 1 tablet 2 to 3 x /day ONLY if needed  For Dizziness / Vertigo 06/28/18  Yes [name], MD  metoprolol tartrate (LOPRESSOR) 50 MG tablet Take 1 tablet 2 x /day for BP & Heart Patient taking differently: Take 25 mg by mouth at bedtime.  12/23/18  Yes [name], MD  Multiple Vitamins-Minerals (MULTIVITAMIN WITH MINERALS) tablet Take 1 tablet by mouth daily.   Yes [provider]  nitroGLYCERIN (NITROSTAT) 0.4 MG SL tablet Place 1 tablet (0.4 mg total) under the tongue every 5 (five) minutes as needed for chest pain. 09/04/14  Yes Larey Dresser, MD  omeprazole (PRILOSEC) 40 MG capsule Take 1 capsule 2 x /day for  Indigestion , Heartburn & Nausea Patient taking differently: Take 40 mg by mouth 2 (two) times daily. Take 1 capsule 2 x /day for Indigestion , Heartburn & Nausea 04/18/19  Yes [name], MD  polyethylene glycol powder (GLYCOLAX/MIRALAX) powder Take 17 g by mouth 2 (two) times daily. Patient taking differently: Take 17 g by mouth daily.  05/16/17  Yes Armbruster, Carlota Raspberry, MD  predniSONE (DELTASONE) 20 MG tablet 2 tablets daily for 3 days, 1 tablet daily for 4 days. 12/10/19  Yes McClanahan, Danton Sewer, NP  rOPINIRole (REQUIP) 4 MG tablet Take 1 tablet at Bedtime for Rest Legs 11/01/19  Yes Vicie Mutters, PA-C  sucralfate (CARAFATE) 1 g tablet Take 1 tablet 4 x /day before Meals & Bedtime for Indigestion & Heartburn Patient taking differently: Take 1 g by mouth 4 (four) times daily. Take 1 tablet 4 x /day before Meals & Bedtime for Indigestion & Heartburn 08/10/19  Yes [name], MD  trospium (SANCTURA) 20 MG tablet Take 20 mg by mouth 2 (two) times daily. 12/02/19  Yes [provider]  vitamin B-12 (CYANOCOBALAMIN) 1000 MCG tablet Take 1,000 mcg by mouth daily.  Yes [provider]  baclofen (LIORESAL) 10 MG tablet Take 1/2 to 1 tablet 2 x day if needed for muscle spasm Patient not taking: Reported on 12/20/2019 11/27/19   Vicie Mutters, PA-C  dicyclomine (BENTYL) 20 MG tablet Take 1 tablet 3 x  /day before meals for Cramping, Nausea or Diarhea Patient not taking: Reported on 12/20/2019 08/06/19   [name], MD  DULoxetine (CYMBALTA) 30 MG capsule Start taking 1 cap daily, for mood and chronic pain; increase to 2 caps in 1 month if tolerating well. Patient not taking: Reported on 12/20/2019 07/05/19   [name], NP  gabapentin (NEURONTIN) 800 MG tablet Take 1/2 to 1 tablet 3 x /day for Pain Patient not taking: Reported on 12/20/2019 05/20/19   [name], MD  HYDROcodone-acetaminophen (NORCO/VICODIN) 5-325 MG tablet Take 1 tablet by mouth every 4 (four) hours as  needed for moderate pain. Patient not taking: Reported on 12/05/2019 11/29/19   Suzan Slick, NP  oxybutynin (DITROPAN-XL) 10 MG 24 hr tablet Take 1 tablet at Bedtime for Bladder Patient not taking: Reported on 12/20/2019 09/30/19   [name], MD  prochlorperazine (COMPAZINE) 10 MG tablet Take 1 tablet 3 x /day before for Meals to Prevent Nausea Patient not taking: Reported on 12/20/2019 09/06/19   [name], MD  promethazine (PHENERGAN) 25 MG tablet Take 1 tablet every 4 hours if needed for Nausea or Vomitting Patient not taking: Reported on 12/20/2019 09/06/19   [name], MD  senna-docusate (SENOKOT-S) 8.6-50 MG tablet Take 2 tablets by mouth 2 (two) times daily. Patient not taking: Reported on 12/20/2019 12/26/15   Roxan Hockey, MD     Allergies:     Allergies  Allergen Reactions  . Ace Inhibitors Cough    Per Dr Melvyn Novas pulmonology 2013  . Aspartame Diarrhea and Nausea And Vomiting  . Atorvastatin Other (See Comments)    Muscle aches  . Biaxin [Clarithromycin] Other (See Comments)    Unknown reaction : PATIENT CAN TOLERATE Z-PAK.  Is written on patient's paper chart.  [name] [Roflumilast] Nausea And Vomiting  . Erythromycin Other (See Comments)    Unknown reaction: PATIENT CAN TOLERATE Z-PAK.  It is written on patient's paper chart.  . Levofloxacin Nausea And Vomiting     Physical Exam:   Vitals  Blood pressure (!) 162/75, pulse 78, temperature 98.5 F (36.9 C), temperature source Oral, resp. rate 13, SpO2 98 %.  1.  General: Lying supine in bed in no acute distress slightly groggy  2. Psychiatric: Mood and behavior normal for situation, alert and oriented x3  3. Neurologic: Cranial nerves II through XII are grossly intact, speech is normal, no focal deficits on limited exam, at baseline  4. HEENMT:  Head is atraumatic, normocephalic, pupils reactive to light, neck is supple, mucous membranes are moist, trachea is midline  5. Respiratory  : Lungs are clear to auscultation bilaterally  6. Cardiovascular : Heart rate and rhythm are normal without murmurs rubs or gallops  7. Gastrointestinal:  Abdomen is soft, nondistended, nontender to palpation  8. Skin:  No acute lesions on limited skin exam  9.Musculoskeletal:  Tender over right hip leg and pelvic bony structures    Data Review:    CBC Recent Labs  Lab 12/20/19 2052  WBC 23.1*  HGB 13.6  HCT 41.7  PLT 416*  MCV 95.2  MCH 31.1  MCHC 32.6  RDW 14.5  LYMPHSABS 2.1  MONOABS 1.4*  EOSABS 0.0  BASOSABS 0.1   ------------------------------------------------------------------------------------------------------------------  Results for orders placed or performed during the hospital encounter of 12/20/19 (from the past 48 hour(s))  Basic metabolic panel     Status: Abnormal   Collection Time: 12/20/19  8:52 PM  Result Value Ref Range   Sodium 138 135 - 145 mmol/L   Potassium 3.8 3.5 - 5.1 mmol/L   Chloride 105 98 - 111 mmol/L   CO2 23 22 - 32 mmol/L   Glucose, Bld 91 70 - 99 mg/dL    Comment: Glucose reference range applies only to samples taken after fasting for at least 8 hours.   BUN 22 8 - 23 mg/dL   Creatinine, Ser 0.73 0.44 - 1.00 mg/dL   Calcium 8.4 (L) 8.9 - 10.3 mg/dL   GFR calc non Af Amer >60 >60 mL/min   GFR calc Af Amer >60 >60 mL/min   Anion gap 10 5 - 15    Comment: Performed at [facility], [place] [address], [place], [place] [address]  CBC with Differential     Status: Abnormal   Collection Time: 12/20/19  8:52 PM  Result Value Ref Range   WBC 23.1 (H) 4.0 - 10.5 K/uL   RBC 4.38 3.87 - 5.11 MIL/uL   Hemoglobin 13.6 12.0 - 15.0 g/dL   HCT 41.7 36 - 46 %   MCV 95.2 80.0 - 100.0 fL   MCH 31.1 26.0 - 34.0 pg   MCHC 32.6 30.0 - 36.0 g/dL   RDW 14.5 11.5 - 15.5 %   Platelets 416 (H) 150 - 400 K/uL   nRBC 0.0 0.0 - 0.2 %   Neutrophils Relative % 84 %   Neutro Abs 19.3 (H) 1.7 - 7.7 K/uL   Lymphocytes Relative 9 %    Lymphs Abs 2.1 0.7 - 4.0 K/uL   Monocytes Relative 6 %   Monocytes Absolute 1.4 (H) 0 - 1 K/uL   Eosinophils Relative 0 %   Eosinophils Absolute 0.0 0 - 0 K/uL   Basophils Relative 0 %   Basophils Absolute 0.1 0 - 0 K/uL   Immature Granulocytes 1 %   Abs Immature Granulocytes 0.29 (H) 0.00 - 0.07 K/uL    Comment: Performed at [facility], [place] [address] [place], [place] [address]  Urinalysis, Routine w reflex microscopic     Status: None   Collection Time: 12/20/19  8:52 PM  Result Value Ref Range   Color, Urine YELLOW YELLOW   APPearance CLEAR CLEAR   Specific Gravity, Urine 1.013 1.005 - 1.030   pH 7.0 5.0 - 8.0   Glucose, UA NEGATIVE NEGATIVE mg/dL   Hgb urine dipstick NEGATIVE NEGATIVE   Bilirubin Urine NEGATIVE NEGATIVE   Ketones, ur NEGATIVE NEGATIVE mg/dL   Protein, ur NEGATIVE NEGATIVE mg/dL   Nitrite NEGATIVE NEGATIVE   Leukocytes,Ua NEGATIVE NEGATIVE    Comment: Performed at [place] [address] [place], [place] [address]  Troponin I (High Sensitivity)     Status: None   Collection Time: 12/20/19 10:01 PM  Result Value Ref Range   Troponin I (High Sensitivity) 6 <18 ng/L    Comment: (NOTE) Elevated high sensitivity troponin I (hsTnI) values and significant  changes across serial measurements may suggest ACS but many other  chronic and acute conditions are known to elevate hsTnI results.  Refer to the "Links" section for chest pain algorithms and additional  guidance. Performed at [facility], [place] [address] [place], [place] [address]    *Note:  Due to a large number of results and/or encounters for the requested time period, some results have not been displayed. A complete set of results can be found in Results Review.    Chemistries  Recent Labs  Lab 12/20/19 2052  NA 138  K 3.8  CL 105  CO2 23  GLUCOSE 91  BUN 22  CREATININE 0.73  CALCIUM 8.4*    ------------------------------------------------------------------------------------------------------------------  ------------------------------------------------------------------------------------------------------------------ GFR: CrCl cannot be calculated (Unknown ideal weight.). Liver Function Tests: No results for input(s): AST, ALT, ALKPHOS, BILITOT, PROT, ALBUMIN in the last 168 hours. No results for input(s): LIPASE, AMYLASE in the last 168 hours. No results for input(s): AMMONIA in the last 168 hours. Coagulation Profile: No results for input(s): INR, PROTIME in the last 168 hours. Cardiac Enzymes: No results for input(s): CKTOTAL, CKMB, CKMBINDEX, TROPONINI in the last 168 hours. BNP (last 3 results) No results for input(s): PROBNP in the last 8760 hours. HbA1C: No results for input(s): HGBA1C in the last 72 hours. CBG: No results for input(s): GLUCAP in the last 168 hours. Lipid Profile: No results for input(s): CHOL, HDL, LDLCALC, TRIG, CHOLHDL, LDLDIRECT in the last 72 hours. Thyroid Function Tests: No results for input(s): TSH, T4TOTAL, FREET4, T3FREE, THYROIDAB in the last 72 hours. Anemia Panel: No results for input(s): VITAMINB12, FOLATE, FERRITIN, TIBC, IRON, RETICCTPCT in the last 72 hours.  --------------------------------------------------------------------------------------------------------------- Urine analysis:    Component Value Date/Time   COLORURINE YELLOW 12/20/2019 2052   APPEARANCEUR CLEAR 12/20/2019 2052   LABSPEC 1.013 12/20/2019 2052   PHURINE 7.0 12/20/2019 2052   GLUCOSEU NEGATIVE 12/20/2019 2052   HGBUR NEGATIVE 12/20/2019 2052   BILIRUBINUR NEGATIVE 12/20/2019 2052   KETONESUR NEGATIVE 12/20/2019 2052   PROTEINUR NEGATIVE 12/20/2019 2052   UROBILINOGEN 0.2 09/19/2014 2235   NITRITE NEGATIVE 12/20/2019 2052   LEUKOCYTESUR NEGATIVE 12/20/2019 2052      Imaging Results:    DG Chest 2 View  Result Date: 12/20/2019 CLINICAL  DATA:  Chest pain.  Fall. EXAM: CHEST - 2 VIEW COMPARISON:  Chest radiograph 11/29/2019, CT 04/29/2019 FINDINGS: Mild chronic cardiomegaly. Unchanged mediastinal contours. Improved linear opacity at the right lung base. No acute airspace disease. No pneumothorax or large pleural effusion. No pulmonary edema. Degenerative change in the left shoulder. Kyphoplasty in the lower thoracic spine is partially visualized. IMPRESSION: No acute chest findings. Mild chronic cardiomegaly. Electronically Signed   By: Keith Rake M.D.   On: 12/20/2019 22:20   CT PELVIS WO CONTRAST  Result Date: 12/20/2019 CLINICAL DATA:  Fall, right hip pain EXAM: CT PELVIS WITHOUT CONTRAST TECHNIQUE: Multidetector CT imaging of the pelvis was performed following the standard protocol without intravenous contrast. COMPARISON:  Plain films today FINDINGS: Urinary Tract:  No abnormality visualized. Bowel:  Unremarkable visualized pelvic bowel loops. Vascular/Lymphatic: Aortic and iliac calcifications. No aneurysm or adenopathy. Reproductive:  Prior hysterectomy.  No adnexal masses. Other:  No free fluid or free air. Musculoskeletal: There is a minimally displaced fracture through the right inferior pubic ramus. Nondisplaced fracture also seen within the ischium at the anteromedial acetabulum. No proximal femoral fracture. No subluxation or dislocation. IMPRESSION: Fractures through the right inferior pubic ramus and right ischium/anteromedial acetabulum. No proximal femoral abnormality. Electronically Signed   By: Rolm Baptise M.D.   On: 12/20/2019 20:09   DG Hip Unilat With Pelvis 2-3 Views Right  Result Date: 12/20/2019 CLINICAL DATA:  84 year old female with fall and hip pain. EXAM: DG HIP (WITH OR WITHOUT PELVIS) 2-3V RIGHT COMPARISON:  CT of  the abdomen pelvis dated 06/10/2019. FINDINGS: There is no acute fracture or dislocation. The bones are osteopenic. There is mild to moderate arthritic changes of the hips bilaterally. There is  degenerative changes of the lower lumbar spine. L4 vertebroplasty. The soft tissues are unremarkable. IMPRESSION: No acute fracture or dislocation. Electronically Signed   By: Anner Crete M.D.   On: 12/20/2019 18:34       Assessment & Plan:    Active Problems:   Pubic ramus fracture ([place])   1. Pubic ramus and acetabular fractures 1. As shown on CT 2. Ortho consulted from ED - likely non operable, admit for PT and pain control, ortho to see in the AM 3. Pain scale for pain control 4. PT and OT consulted 5. Continue to monitor 2. Fall 1. Likely Mechanical 2. No preceding factors 3. Monitor on tele to r/o arrhythmia 3. Leukocytosis 1. WBC 23 2. No other infectious signs or symptoms 3. Patient has been on prednisone in the outpatient setting for "chest wall pain." 4. Continue to trend with CBC in the AM 4. HTN 1. BNP 157/74 in ED 2. Pain control 3. Continue home medications 4. Continue to monitor 5. CAD 1. Continue home medications    DVT Prophylaxis-  heparin- SCDs   AM Labs Ordered, also please review Full Orders  Family Communication:No family at bedside  Code Status:  Full  Admission status: Inpatient :The appropriate admission status for this patient is INPATIENT. Inpatient status is judged to be reasonable and necessary in order to provide the required intensity of service to ensure the patient's safety. The patient's presenting symptoms, physical exam findings, and initial radiographic and laboratory data in the context of their chronic comorbidities is felt to place them at high risk for further clinical deterioration. Furthermore, it is not anticipated that the patient will be medically stable for discharge from the hospital within 2 midnights of admission. The following factors support the admission status of inpatient.     The patient's presenting symptoms include right hip pain The worrisome physical exam findings include right leg/hip/pelvis  tenderness The initial radiographic and laboratory data are worrisome because of CT pubic ramus fracture and acetabular fracture       * I certify that at the point of admission it is my clinical judgment that the patient will require inpatient hospital care spanning beyond 2 midnights from the point of admission due to high intensity of service, high risk for further deterioration and high frequency of surveillance required.*  Time spent in minutes : [place]

## 2019-12-20 NOTE — ED Triage Notes (Signed)
Arrived by EMS from home. Patient reports she was taking her husband some tea and "misstepped" Patient denies any dizziness, did not hit her head, denies loss of consciousness. Patient reports right hip pain. Patient received 100 mcg Fentanyl en route to this facility.  BP: 161/90 P: 80 R:16 O2: 95% room air

## 2019-12-20 NOTE — ED Provider Notes (Addendum)
[facility] EMERGENCY DEPARTMENT Provider Note  CSN: [id number] Arrival date & time: 12/20/19 1650    History Chief Complaint  Patient presents with   Fall   Hip Pain    right hip    HPI  [name] [name] is a 84 y.o. female reports she was walking out of her house when she lost her footing coming down the step and fell, landing on her R hip and sustaining several abrasions in a bush. Did not hit her head or lose consciousness. She is complaining of severe pain in R hip, worse with movement. Given fentanyl by EMS PTA.    Past Medical History:  Diagnosis Date   Anxiety    takes Xanax daily as needed   Asthma    Albuterol daily as needed   Blood transfusion without reported diagnosis    CAD (coronary artery disease)    LHC 2003 with 40% pLAD, 30% mLAD, 100% D1 (moderate vessel), 100%  D2 (small vessel), 95% mid small OM2.  Pt had PTCA to D1;  D2 and OM2 small vessels and tx medically;  Last Myoview (2012): anterior infarct seen with scar, no ischemia. EF normal. Med Rx // Myoview (12/14):  Low risk; ant scar w/ peri-infarct ischemia; EF 55% with ant AK // Myoview 5/16: EF 55-65, ant, apical scar, no ischemia    Cataract    Chronic back pain    HNP, DDD, spinal stenosis, vertebral compression fractures.    Chronic nausea    takes Zofran daily as needed.  normal gastric emptying study 03/2013   CKD (chronic kidney disease), stage III 09/27/2017   Constipation    felt to be functional. takes Senokot and Miralax daily.  treated with Linzess in past.    DIVERTICULOSIS, COLON 08/28/2008   Qualifier: Diagnosis of  By: [name], [name], [name]     Elevated LFTs 2015   probably med induced DILI, from Augmentin vs Pravastatin   GERD (gastroesophageal reflux disease)    hx of esophageal stricture    Hiatal hernia    Paraesophageal hernias. s/p fundoplications in 7858 and 04/2013   History of bronchitis several of yrs ago   History of colon polyps 03/2009, 03/2011    adenomatous colon polyps, no high grade dysplasia.    History of vertigo    no meds   Hyperlipidemia    takes Pravastatin daily   Hypertension    takes Amlodipine,Metoprolol,and Losartan daily   Nausea 03/2016   Osteoporosis    PONV (postoperative nausea and vomiting)    yrs ago   Slow urinary stream    takes Rapaflo daily   TIA (transient ischemic attack) 1995   no residual effects noted    Past Surgical History:  Procedure Laterality Date   APPENDECTOMY     patient unsure of date   [facility]     Patient unsure of date.   CORONARY ANGIOPLASTY  11/16/2001   1 stent   ESOPHAGEAL MANOMETRY N/A 03/25/2013   Procedure: ESOPHAGEAL MANOMETRY (EM);  Surgeon: Inda Castle, MD;  Location: [facility] END[facility];  Service: Endoscopy;  Laterality: N/A;   ESOPHAGOGASTRODUODENOSCOPY N/A 03/15/2013   Procedure: ESOPHAGOGASTRODUODENOSCOPY (EGD);  Surgeon: Jerene Bears, MD;  Location[facility] ENDOSCOPY;  Service: Endoscopy;  Laterality: N/A;   ESOPHAGOGASTRODUODENOSCOPY N/A 12/25/2015   Procedure: ESOPHAGOGASTRODUODENOSCOPY (EGD);  Surgeon: Doran Stabler, MD;  Location: [facility] ENDOSCOPY;  Service: Endoscopy;  Laterality: N/A;   EYE  SURGERY  11/2000   bilateral cataracts with lens implant   FIXATION KYPHOPLASTY LUMBAR SPINE  X 2   "L3-4"   HEMORRHOID SURGERY     HIATAL HERNIA REPAIR  06/03/2011   Procedure: LAPAROSCOPIC REPAIR OF HIATAL HERNIA;  Surgeon: Adin Hector, MD;  Location: [facility] ORS;  Service: General;  Laterality: N/A;   HIATAL HERNIA REPAIR N/A 04/24/2013   Procedure: LAPAROSCOPIC  REPAIR RECURRENT PARASOPHAGEAL HIATAL HERNIA WITH FUNDOPLICATION;  Surgeon: Adin Hector, MD;  Location: [facility] ORS;  Service: General;  Laterality: N/A;   INSERTION OF MESH N/A 04/24/2013   Procedure: INSERTION OF MESH;  Surgeon: Adin Hector, MD;  Location: [facility] ORS;  Service: General;  Laterality: N/A;   IR KYPHO THORACIC WITH BONE BIOPSY   09/29/2017   IR KYPHO THORACIC WITH BONE BIOPSY  11/03/2017   IR KYPHO THORACIC WITH BONE BIOPSY  02/14/2019   IR KYPHO THORACIC WITH BONE BIOPSY  04/02/2019   LAPAROSCOPIC LYSIS OF ADHESIONS N/A 04/24/2013   Procedure: LAPAROSCOPIC LYSIS OF ADHESIONS;  Surgeon: Adin Hector, MD;  Location: [facility];  Service: General;  Laterality: N/A;   LAPAROSCOPIC NISSEN FUNDOPLICATION  2/67/1245   Procedure: LAPAROSCOPIC NISSEN FUNDOPLICATION;  Surgeon: Adin Hector, MD;  Location: [facility] OR[facility];  Service: General;  Laterality: N/A;   LUMBAR FUSION  12/07/2015   Right L2-3 facetectomy, posterolateral fusion L2-3 with pedicle screws, rods, local bone graft, Vivigen cancellous chips. Fusion extended to the L1 level with pedicle screws and rods   LUMBAR LAMINECTOMY/DECOMPRESSION MICRODISCECTOMY Right 06/26/2012   Procedure: LUMBAR LAMINECTOMY/DECOMPRESSION MICRODISCECTOMY;  Surgeon: Jessy Oto, MD;  Location: [place];  Service: Orthopedics;  Laterality: Right;  RIGHT L4-5 MICRODISCECTOMY   LUMBAR LAMINECTOMY/DECOMPRESSION MICRODISCECTOMY N/A 05/29/2015   Procedure: RIGHT L2-3 MICRODISCECTOMY;  Surgeon: Jessy Oto, MD;  Location: [place];  Service: Orthopedics;  Laterality: N/A;   TOTAL ABDOMINAL HYSTERECTOMY  1975   partial   UPPER GASTROINTESTINAL ENDOSCOPY      Family History  Problem Relation Age of Onset   Colon cancer Mother 36   Heart attack Father 84       heart attack   Brain cancer Brother        tumor    Heart disease Brother    Heart disease Brother    Kidney disease Daughter    Esophageal cancer Neg Hx    Stomach cancer Neg Hx     Social History   Tobacco Use   Smoking status: Never Smoker   Smokeless tobacco: Never Used  Vaping Use   Vaping Use: Never used  Substance Use Topics   Alcohol use: No   Drug use: Never     Home Medications Prior to Admission medications   Medication Sig Start Date End Date Taking? Authorizing Provider  acetaminophen  (TYLENOL) 500 MG tablet Take 1 tablet (500 mg total) by mouth every 6 (six) hours as needed. Patient taking differently: Take 500 mg by mouth every 6 (six) hours as needed for moderate pain.  03/18/18  Yes Bast, Traci A, NP  ALPRAZolam (XANAX) 0.5 MG tablet Take 1/2 tablet 1- 2 x /day ONLY if needed for Anxiety Attack &  limit to 5 days /week to avoid Addiction - DO NOT take with Hydrocodone or Codeine Patient taking differently: Take 0.25-0.5 mg by mouth daily as needed for anxiety. Take 1/2 tablet 1- 2 x /day ONLY if needed for Anxiety Attack &  limit to 5 days /week to avoid Addiction -  DO NOT take with Hydrocodone or Codeine 04/20/19  Yes [name], MD  amLODipine (NORVASC) 10 MG tablet Take 1 tablet Daily for BP & Heart 02/07/19  Yes [name], MD  aspirin EC 81 MG tablet Take 81 mg by mouth at bedtime.    Yes [provider]  Cholecalciferol (VITAMIN D3) 5000 units CAPS Take 5,000 Units by mouth 2 (two) times daily.    Yes [provider]  denosumab (PROLIA) 60 MG/ML SOSY injection Inject 60 mg into the skin every 6 (six) months. 08/20/19  Yes Deveshwar, Abel Presto, MD  diclofenac sodium (VOLTAREN) 1 % GEL Apply 2 g topically 4 (four) times daily. Patient taking differently: Apply 2 g topically 4 (four) times daily as needed (pain).  03/18/18  Yes Bast, Traci A, NP  Fluticasone-Salmeterol (ADVAIR DISKUS) 250-50 MCG/DOSE AEPB Inhale 1 puff into the lungs in the morning and at bedtime. 10/16/19  Yes Vicie Mutters, PA-C  gabapentin (NEURONTIN) 300 MG capsule Take 300 mg by mouth 4 (four) times daily as needed (pain).  12/18/19  Yes [provider]  Glycopyrrolate-Formoterol (BEVESPI AEROSPHERE) 9-4.8 MCG/ACT AERO Inhale 1 puff into the lungs daily.   Yes [provider]  ipratropium-albuterol (DUONEB) 0.5-2.5 (3) MG/3ML SOLN Take 3 mLs by nebulization every 6 (six) hours as needed (shortness of breath/wheezing).    Yes [provider]  ketoconazole  (NIZORAL) 2 % cream Apply 1 application topically 2 x /day Patient taking differently: Apply 1 application topically 2 (two) times daily. Apply 1 application topically 2 x /day 11/28/19  Yes [name], MD  Magnesium 400 MG TABS Take 400 mg by mouth 2 (two) times daily.    Yes [provider]  meclizine (ANTIVERT) 25 MG tablet Take 1/2 to 1 tablet 2 to 3 x /day ONLY if needed  For Dizziness / Vertigo Patient taking differently: Take 12.5-25 mg by mouth 3 (three) times daily as needed for dizziness. Take 1/2 to 1 tablet 2 to 3 x /day ONLY if needed  For Dizziness / Vertigo 06/28/18  Yes [name], MD  metoprolol tartrate (LOPRESSOR) 50 MG tablet Take 1 tablet 2 x /day for BP & Heart Patient taking differently: Take 25 mg by mouth at bedtime.  12/23/18  Yes [name], MD  Multiple Vitamins-Minerals (MULTIVITAMIN WITH MINERALS) tablet Take 1 tablet by mouth daily.   Yes [provider]  nitroGLYCERIN (NITROSTAT) 0.4 MG SL tablet Place 1 tablet (0.4 mg total) under the tongue every 5 (five) minutes as needed for chest pain. 09/04/14  Yes Larey Dresser, MD  omeprazole (PRILOSEC) 40 MG capsule Take 1 capsule 2 x /day for Indigestion , Heartburn & Nausea Patient taking differently: Take 40 mg by mouth 2 (two) times daily. Take 1 capsule 2 x /day for Indigestion , Heartburn & Nausea 04/18/19  Yes [name], MD  polyethylene glycol powder (GLYCOLAX/MIRALAX) powder Take 17 g by mouth 2 (two) times daily. Patient taking differently: Take 17 g by mouth daily.  05/16/17  Yes Armbruster, Carlota Raspberry, MD  predniSONE (DELTASONE) 20 MG tablet 2 tablets daily for 3 days, 1 tablet daily for 4 days. 12/10/19  Yes McClanahan, Danton Sewer, NP  rOPINIRole (REQUIP) 4 MG tablet Take 1 tablet at Bedtime for Rest Legs 11/01/19  Yes Vicie Mutters, PA-C  sucralfate (CARAFATE) 1 g tablet Take 1 tablet 4 x /day before Meals & Bedtime for Indigestion & Heartburn Patient taking differently: Take 1 g by  mouth 4 (four) times daily. Take  1 tablet 4 x /day before Meals & Bedtime for Indigestion & Heartburn 08/10/19  Yes [name], MD  trospium (SANCTURA) 20 MG tablet Take 20 mg by mouth 2 (two) times daily. 12/02/19  Yes [provider]  vitamin B-12 (CYANOCOBALAMIN) 1000 MCG tablet Take 1,000 mcg by mouth daily.   Yes [provider]  baclofen (LIORESAL) 10 MG tablet Take 1/2 to 1 tablet 2 x day if needed for muscle spasm Patient not taking: Reported on 12/20/2019 11/27/19   Vicie Mutters, PA-C  dicyclomine (BENTYL) 20 MG tablet Take 1 tablet 3 x  /day before meals for Cramping, Nausea or Diarhea Patient not taking: Reported on 12/20/2019 08/06/19   [name], MD  DULoxetine (CYMBALTA) 30 MG capsule Start taking 1 cap daily, for mood and chronic pain; increase to 2 caps in 1 month if tolerating well. Patient not taking: Reported on 12/20/2019 07/05/19   [name], NP  gabapentin (NEURONTIN) 800 MG tablet Take 1/2 to 1 tablet 3 x /day for Pain Patient not taking: Reported on 12/20/2019 05/20/19   [name], MD  HYDROcodone-acetaminophen (NORCO/VICODIN) 5-325 MG tablet Take 1 tablet by mouth every 4 (four) hours as needed for moderate pain. Patient not taking: Reported on 12/05/2019 11/29/19   Suzan Slick, NP  oxybutynin (DITROPAN-XL) 10 MG 24 hr tablet Take 1 tablet at Bedtime for Bladder Patient not taking: Reported on 12/20/2019 09/30/19   [name], MD  prochlorperazine (COMPAZINE) 10 MG tablet Take 1 tablet 3 x /day before for Meals to Prevent Nausea Patient not taking: Reported on 12/20/2019 09/06/19   [name], MD  promethazine (PHENERGAN) 25 MG tablet Take 1 tablet every 4 hours if needed for Nausea or Vomitting Patient not taking: Reported on 12/20/2019 09/06/19   [name], MD  senna-docusate (SENOKOT-S) 8.6-50 MG tablet Take 2 tablets by mouth 2 (two) times daily. Patient not taking: Reported on 12/20/2019 12/26/15   Roxan Hockey, MD       Allergies    Ace inhibitors, Aspartame, Atorvastatin, Biaxin [clarithromycin], Daliresp [roflumilast], Erythromycin, and Levofloxacin   Review of Systems   Review of Systems A comprehensive review of systems was completed and negative except as noted in HPI.    Physical Exam BP (!) 174/82    Pulse 73    Temp 98.4 F (36.9 C) (Oral)    Resp 16    SpO2 96%   Physical Exam Vitals and nursing note reviewed.  Constitutional:      Appearance: Normal appearance.  HENT:     Head: Normocephalic and atraumatic.     Nose: Nose normal.     Mouth/Throat:     Mouth: Mucous membranes are moist.  Eyes:     Extraocular Movements: Extraocular movements intact.     Conjunctiva/sclera: Conjunctivae normal.  Cardiovascular:     Rate and Rhythm: Normal rate.  Pulmonary:     Effort: Pulmonary effort is normal.     Breath sounds: Normal breath sounds.  Chest:     Chest wall: Tenderness present.  Abdominal:     General: Abdomen is flat.     Palpations: Abdomen is soft.     Tenderness: There is no abdominal tenderness.  Musculoskeletal:        General: Tenderness (R lateral hip) present. No swelling or deformity.     Cervical back: Neck supple.     Comments: Decreased ROM R leg due to pain, NVI  Skin:    General: Skin is warm and dry.  Comments: Various superficial abrasions to BUE  Neurological:     General: No focal deficit present.     Mental Status: She is alert.  Psychiatric:        Mood and Affect: Mood normal.      ED Results / Procedures / Treatments   Labs (all labs ordered are listed, but only abnormal results are displayed) Labs Reviewed  BASIC METABOLIC PANEL - Abnormal; Notable for the following components:      Result Value   Calcium 8.4 (*)    All other components within normal limits  CBC WITH DIFFERENTIAL/PLATELET - Abnormal; Notable for the following components:   WBC 23.1 (*)    Platelets 416 (*)    Neutro Abs 19.3 (*)    Monocytes Absolute 1.4 (*)     Abs Immature Granulocytes 0.29 (*)    All other components within normal limits  SARS CORONAVIRUS 2 BY RT PCR (HOSPITAL ORDER, [place])  URINALYSIS, ROUTINE W REFLEX MICROSCOPIC    EKG None  Radiology CT PELVIS WO CONTRAST  Result Date: 12/20/2019 CLINICAL DATA:  Fall, right hip pain EXAM: CT PELVIS WITHOUT CONTRAST TECHNIQUE: Multidetector CT imaging of the pelvis was performed following the standard protocol without intravenous contrast. COMPARISON:  Plain films today FINDINGS: Urinary Tract:  No abnormality visualized. Bowel:  Unremarkable visualized pelvic bowel loops. Vascular/Lymphatic: Aortic and iliac calcifications. No aneurysm or adenopathy. Reproductive:  Prior hysterectomy.  No adnexal masses. Other:  No free fluid or free air. Musculoskeletal: There is a minimally displaced fracture through the right inferior pubic ramus. Nondisplaced fracture also seen within the ischium at the anteromedial acetabulum. No proximal femoral fracture. No subluxation or dislocation. IMPRESSION: Fractures through the right inferior pubic ramus and right ischium/anteromedial acetabulum. No proximal femoral abnormality. Electronically Signed   By: Rolm Baptise M.D.   On: 12/20/2019 20:09   DG Hip Unilat With Pelvis 2-3 Views Right  Result Date: 12/20/2019 CLINICAL DATA:  84 year old female with fall and hip pain. EXAM: DG HIP (WITH OR WITHOUT PELVIS) 2-3V RIGHT COMPARISON:  CT of the abdomen pelvis dated 06/10/2019. FINDINGS: There is no acute fracture or dislocation. The bones are osteopenic. There is mild to moderate arthritic changes of the hips bilaterally. There is degenerative changes of the lower lumbar spine. L4 vertebroplasty. The soft tissues are unremarkable. IMPRESSION: No acute fracture or dislocation. Electronically Signed   By: Anner Crete M.D.   On: 12/20/2019 18:34    Procedures Procedures  Medications Ordered in the ED Medications  ketorolac  (TORADOL) 30 MG/ML injection 15 mg (15 mg Intravenous Given 12/20/19 1934)  morphine 4 MG/ML injection 4 mg (4 mg Intravenous Given 12/20/19 2048)  ondansetron (ZOFRAN) injection 4 mg (4 mg Intravenous Given 12/20/19 2048)     MDM Rules/Calculators/A&P MDM Patient with hip pain after fall. No obvious deformity, will check xray and reassess. No other reported injuries.  ED Course  I have reviewed the triage vital signs and the nursing notes.  Pertinent labs & imaging results that were available during my care of the patient were reviewed by me and considered in my medical decision making (see chart for details).  Clinical Course as of Dec 19 2312  Fri Dec 20, 2019  1904 Xray images and results reviewed, no apparent fracture. Patient still complaining of significant pain, seems more localized to R buttock now, no bruising noted. Will given Toradol for pain control and send for CT pelvis to eval occult fracture.  No lumbar spine tenderness on re-exam.    [CS]  2019 CT images and results reviewed, patient still having significant pain. Will give a dose of Morphine and discuss pubic rami/acetabulum fx with on call Ortho. She sees Dr. Ninfa [facility] at [facility]   [CS]  2031 Spoke with Dr. Lorin Mercy, Ortho, who recommends admission to Hospitalist for pain control and he will consult. Basic labs ordered to facilitate admission.    [CS]  2118 WBC elevated of unclear etiology. No infectious symptoms. UA normal. Patient has recently been on prednisone for chest wall pain.    [CS]  2136 BMP unremarkable. Will discuss admission with Hospitalist.    [CS]  2145 Spoke with Dr. Clearence Ped, [place], who will evaluate for admission.    [CS]  2153 Patient now reporting some chest pain. As above, she has seen PCP recently for similar and diagnosed with chest wall pain. Will check EKG, CXR and add Trop series to her labs.    [CS]  2220 EKG:  Rate 80 Rhythm: NSR Axis: LAD Intervals: normal ST/T: nonspecific  T-wave flattening No old available for comparison   [CS]    Clinical Course User Index [CS] Truddie Hidden, MD    Final Clinical Impression(s) / ED Diagnoses Final diagnoses:  Multiple closed fractures of pelvis without disruption of pelvic ring, initial encounter [facility])    Rx / DC Orders ED Discharge Orders    None       Truddie Hidden, MD 12/20/19 2147    Truddie Hidden, MD 12/20/19 2314

## 2019-12-21 DIAGNOSIS — S3282XA Multiple fractures of pelvis without disruption of pelvic ring, initial encounter for closed fracture: Secondary | ICD-10-CM

## 2019-12-21 DIAGNOSIS — S32591A Other specified fracture of right pubis, initial encounter for closed fracture: Secondary | ICD-10-CM

## 2019-12-21 LAB — COMPREHENSIVE METABOLIC PANEL
ALT: 621 U/L — ABNORMAL HIGH (ref 0–44)
AST: 871 U/L — ABNORMAL HIGH (ref 15–41)
Albumin: 3.8 g/dL (ref 3.5–5.0)
Alkaline Phosphatase: 184 U/L — ABNORMAL HIGH (ref 38–126)
Anion gap: 9 (ref 5–15)
BUN: 20 mg/dL (ref 8–23)
CO2: 25 mmol/L (ref 22–32)
Calcium: 8 mg/dL — ABNORMAL LOW (ref 8.9–10.3)
Chloride: 105 mmol/L (ref 98–111)
Creatinine, Ser: 0.68 mg/dL (ref 0.44–1.00)
GFR calc Af Amer: >60 mL/min (ref >60)
GFR calc non Af Amer: >60 mL/min (ref >60)
Glucose, Bld: 106 mg/dL — ABNORMAL HIGH (ref 70–99)
Potassium: 4 mmol/L (ref 3.5–5.1)
Sodium: 139 mmol/L (ref 135–145)
Total Bilirubin: 3.5 mg/dL — ABNORMAL HIGH (ref 0.3–1.2)
Total Protein: 6.4 g/dL — ABNORMAL LOW (ref 6.5–8.1)

## 2019-12-21 LAB — TROPONIN I (HIGH SENSITIVITY): Troponin I (High Sensitivity): 2 ng/L (ref <18)

## 2019-12-21 LAB — CBC
HCT: 42.3 % (ref 36.0–46.0)
Hemoglobin: 13.6 g/dL (ref 12.0–15.0)
MCH: 31.3 pg (ref 26.0–34.0)
MCHC: 32.2 g/dL (ref 30.0–36.0)
MCV: 97.2 fL (ref 80.0–100.0)
Platelets: 366 10*3/uL (ref 150–400)
RBC: 4.35 MIL/uL (ref 3.87–5.11)
RDW: 14.8 % (ref 11.5–15.5)
WBC: 10.2 10*3/uL (ref 4.0–10.5)
nRBC: 0 % (ref 0.0–0.2)

## 2019-12-21 LAB — MAGNESIUM: Magnesium: 2.2 mg/dL (ref 1.7–2.4)

## 2019-12-21 MED ORDER — ONDANSETRON HCL 4 MG PO TABS
4.0000 mg | ORAL_TABLET | Freq: Four times a day (QID) | ORAL | Status: DC | PRN
  Administered 2019-12-23: 4 mg via ORAL
  Filled 2019-12-21: qty 1

## 2019-12-21 MED ORDER — ALPRAZOLAM 0.25 MG PO TABS: 0.2500 mg | ORAL_TABLET | Freq: Every day | ORAL | Status: DC | PRN

## 2019-12-21 MED ORDER — MOMETASONE FURO-FORMOTEROL FUM 200-5 MCG/ACT IN AERO
2.0000 | INHALATION_SPRAY | Freq: Two times a day (BID) | RESPIRATORY_TRACT | Status: DC
  Administered 2019-12-21 – 2019-12-31 (×19): 2 via RESPIRATORY_TRACT
  Filled 2019-12-21: qty 8.8

## 2019-12-21 MED ORDER — ROPINIROLE HCL 1 MG PO TABS
2.0000 mg | ORAL_TABLET | Freq: Three times a day (TID) | ORAL | Status: DC
  Administered 2019-12-21 – 2019-12-31 (×32): 2 mg via ORAL
  Filled 2019-12-21 (×32): qty 2

## 2019-12-21 MED ORDER — SENNOSIDES-DOCUSATE SODIUM 8.6-50 MG PO TABS
2.0000 | ORAL_TABLET | Freq: Two times a day (BID) | ORAL | Status: DC
  Administered 2019-12-21 – 2019-12-31 (×21): 2 via ORAL
  Filled 2019-12-21 (×22): qty 2

## 2019-12-21 MED ORDER — DARIFENACIN HYDROBROMIDE ER 7.5 MG PO TB24
7.5000 mg | ORAL_TABLET | Freq: Every day | ORAL | Status: DC
  Administered 2019-12-21 – 2019-12-31 (×11): 7.5 mg via ORAL
  Filled 2019-12-21 (×11): qty 1

## 2019-12-21 MED ORDER — ASPIRIN EC 81 MG PO TBEC
81.0000 mg | DELAYED_RELEASE_TABLET | Freq: Every day | ORAL | Status: DC
  Administered 2019-12-21 – 2019-12-31 (×11): 81 mg via ORAL
  Filled 2019-12-21 (×11): qty 1

## 2019-12-21 MED ORDER — OXYBUTYNIN CHLORIDE ER 10 MG PO TB24
10.0000 mg | ORAL_TABLET | Freq: Every day | ORAL | Status: DC
  Administered 2019-12-21 – 2019-12-24 (×4): 10 mg via ORAL
  Filled 2019-12-21 (×5): qty 1

## 2019-12-21 MED ORDER — POLYETHYLENE GLYCOL 3350 17 G PO PACK
17.0000 g | PACK | Freq: Every day | ORAL | Status: DC | PRN
  Administered 2019-12-28: 17 g via ORAL
  Filled 2019-12-21: qty 1

## 2019-12-21 MED ORDER — DULOXETINE HCL 30 MG PO CPEP
30.0000 mg | ORAL_CAPSULE | Freq: Two times a day (BID) | ORAL | Status: DC
  Administered 2019-12-21 – 2019-12-31 (×22): 30 mg via ORAL
  Filled 2019-12-21 (×22): qty 1

## 2019-12-21 MED ORDER — ONDANSETRON HCL 4 MG/2ML IJ SOLN
4.0000 mg | Freq: Four times a day (QID) | INTRAMUSCULAR | Status: DC | PRN
  Administered 2019-12-21 – 2019-12-24 (×7): 4 mg via INTRAVENOUS
  Filled 2019-12-21 (×8): qty 2

## 2019-12-21 MED ORDER — DENOSUMAB 60 MG/ML ~~LOC~~ SOSY: 60.0000 mg | PREFILLED_SYRINGE | SUBCUTANEOUS | Status: DC

## 2019-12-21 MED ORDER — METOPROLOL TARTRATE 25 MG PO TABS
25.0000 mg | ORAL_TABLET | Freq: Every day | ORAL | Status: DC
  Administered 2019-12-21 – 2019-12-31 (×11): 25 mg via ORAL
  Filled 2019-12-21 (×11): qty 1

## 2019-12-21 MED ORDER — SUCRALFATE 1 G PO TABS
1.0000 g | ORAL_TABLET | Freq: Three times a day (TID) | ORAL | Status: DC
  Administered 2019-12-21 – 2019-12-31 (×39): 1 g via ORAL
  Filled 2019-12-21 (×40): qty 1

## 2019-12-21 MED ORDER — AMLODIPINE BESYLATE 5 MG PO TABS
10.0000 mg | ORAL_TABLET | Freq: Every day | ORAL | Status: DC
  Administered 2019-12-21 – 2019-12-31 (×11): 10 mg via ORAL
  Filled 2019-12-21 (×12): qty 2

## 2019-12-21 MED ORDER — PANTOPRAZOLE SODIUM 40 MG PO TBEC
40.0000 mg | DELAYED_RELEASE_TABLET | Freq: Every day | ORAL | Status: DC
  Administered 2019-12-21 – 2019-12-31 (×11): 40 mg via ORAL
  Filled 2019-12-21 (×11): qty 1

## 2019-12-21 MED ORDER — KETOROLAC TROMETHAMINE 30 MG/ML IJ SOLN
15.0000 mg | Freq: Four times a day (QID) | INTRAMUSCULAR | Status: DC
  Administered 2019-12-21 – 2019-12-22 (×4): 15 mg via INTRAVENOUS
  Filled 2019-12-21 (×5): qty 1

## 2019-12-21 MED ORDER — METHOCARBAMOL 500 MG PO TABS
500.0000 mg | ORAL_TABLET | Freq: Three times a day (TID) | ORAL | Status: DC | PRN
  Administered 2019-12-21: 500 mg via ORAL
  Filled 2019-12-21: qty 1

## 2019-12-21 MED ORDER — IPRATROPIUM-ALBUTEROL 0.5-2.5 (3) MG/3ML IN SOLN: 3.0000 mL | Freq: Four times a day (QID) | RESPIRATORY_TRACT | Status: DC | PRN

## 2019-12-21 MED ORDER — GABAPENTIN 400 MG PO CAPS
400.0000 mg | ORAL_CAPSULE | Freq: Three times a day (TID) | ORAL | Status: DC
  Filled 2019-12-21: qty 1

## 2019-12-21 MED ORDER — GABAPENTIN 400 MG PO CAPS
400.0000 mg | ORAL_CAPSULE | Freq: Every day | ORAL | Status: DC
  Administered 2019-12-23: 400 mg via ORAL
  Filled 2019-12-21 (×3): qty 1

## 2019-12-21 MED ORDER — HEPARIN SODIUM (PORCINE) 5000 UNIT/ML IJ SOLN
5000.0000 [IU] | Freq: Three times a day (TID) | INTRAMUSCULAR | Status: DC
  Administered 2019-12-21 – 2019-12-31 (×32): 5000 [IU] via SUBCUTANEOUS
  Filled 2019-12-21 (×32): qty 1

## 2019-12-21 NOTE — Progress Notes (Signed)
PROGRESS NOTE    [name] [name]  HUD:[id number] DOB: 28-May-1935 DOA: 12/20/2019 PCP: [name], MD  Brief Narrative:  20 white female prior TIA reflux CKD 3 anxiety CAD (PTCA 2003) hiatal hernia status post fundoplication 8588 admitted earlier this year for intractable nausea vomiting DM TY 2 CKD 2-3 Chronic low back pain pain syndrome status post kyphoplasty Dr. Nicholaus Bloom On gabapentin Robaxin previously? Accidental fall at home 8/13 resulting in 10/10 pain in right hip-imaging showed pubis ramus fracture with acetabular fractures Previous also recent  fall 7/22 2021 with a trip and a fall and struck her right wrist and landed on her chest and was prescribed prednisone by nurse practitioner in the office for chest pain  Treated with Toradol Zofran in ED and admitted for pain control   Assessment & Plan:   Active Problems:   Pubic ramus fracture ([place])   1. Pubic ramus fracture a. Unlikely to need operative management therefore will place on full liquid diets until orthopedics can confirm b. First choice for pain Toradol but because of her prior fundoplication make sure that she gets Protonix/Carafate prior to this c. Oxycodone as a second choice but be careful as she has had prior admissions for encephalopathy secondary to opiates d. PT OT to mobilize hopefully within the next several days she is able to get up and move around and can discharge however may require skilled care depending on their input 2. CAD history 3. HTN a. Continue amlodipine 10 aspirin 81 metoprolol 50 twice daily nitroglycerin b. Because of accidental nature of fall doubt that this is an arrhythmia so only keep on telemetry for the next 24 hours and then discontinue 4. Hiatal hernia with fundoplication a. Be careful with NSAIDs continue Protonix daily, Carafate 1 tablet 4 times a day 5. DM TY 2 with nephropathy 6. CKD 2-3 7. Prior spinal stenosis status post kyphoplasty in the remote past with  chronic pain a. Caution but can resume gabapentin 400 3 times daily pain b. I have held the patient's baclofen as I do not think she is taking it based on MAR c. She seems to be on osteoporosis treatment with Prolia in addition to [place] calcitonin presumably for osteoporotic fracture-these have been held at this time 8. Leukocytosis secondary to steroid use for?  Chest pain? a. Prednisone stopped b. Will ask again in the morning if she needs pain relief for her chest do not think this is ischemic keep on telemetry c. Place Lidoderm patch or diclofenac sore cream on the chest wall because of fall 9. CAD a. Troponins negative-see above discussion   DVT prophylaxis: Continue heparin Code Status: Full Family Communication:  No family present patient seen in the emergency room disposition:   Status is: Inpatient  Remains inpatient appropriate because:Ongoing active pain requiring inpatient pain management and Unsafe d/c plan   Dispo: The patient is from: Home              Anticipated d/c is to: Unclear at this time needs therapy evaluation              Anticipated d/c date is: 2 days              Patient currently is not medically stable to d/c.       Consultants:   Orthopedics  Procedures: X-rays  Antimicrobials: None   Subjective: 9/10 pain without mobility has been medicated-surprisingly despite her subjective feeling of 9/10 pain she is sitting comfortably in the  bed Quite oriented alert tells me fall was accidental She has no fever chills no current chest pain   Objective: Vitals:   12/21/19 0101 12/21/19 0417 12/21/19 0500 12/21/19 0618  BP: (!) 145/68 (!) 143/66 135/68 131/70  Pulse: 71 74 72 79  Resp: 19 20 19  (!) 26  Temp:  98.5 F (36.9 C)    TempSrc:  Oral    SpO2: 94% 94% 92% 91%   No intake or output data in the 24 hours ending 12/21/19 0824 There were no vitals filed for this visit.  Examination:  General exam: EOMI NCAT no focal deficit  extraocular movements intact no pallor no icterus Respiratory system: Clear no added sound S1-S2 no murmur Cardiovascular system: S1-S2 no murmur rub or gallop RRR Gastrointestinal system: Soft no rebound no discernible organomegaly. Central nervous system: Intact moving 4 limbs equally Extremities: Right leg externally rotated Skin: No lower extremity edema Psychiatry: Euthymic pleasant  Data Reviewed: I have personally reviewed following labs and imaging studies  Labs from 8/13 p.m. Sodium 138 BUN/creatinine 22/0.7 which seems to be her baseline Troponins 2.6  White count 23 hemoglobin 13  Radiology Studies: DG Chest 2 View  Result Date: 12/20/2019 CLINICAL DATA:  Chest pain.  Fall. EXAM: CHEST - 2 VIEW COMPARISON:  Chest radiograph 11/29/2019, CT 04/29/2019 FINDINGS: Mild chronic cardiomegaly. Unchanged mediastinal contours. Improved linear opacity at the right lung base. No acute airspace disease. No pneumothorax or large pleural effusion. No pulmonary edema. Degenerative change in the left shoulder. Kyphoplasty in the lower thoracic spine is partially visualized. IMPRESSION: No acute chest findings. Mild chronic cardiomegaly. Electronically Signed   By: Keith Rake M.D.   On: 12/20/2019 22:20   CT PELVIS WO CONTRAST  Result Date: 12/20/2019 CLINICAL DATA:  Fall, right hip pain EXAM: CT PELVIS WITHOUT CONTRAST TECHNIQUE: Multidetector CT imaging of the pelvis was performed following the standard protocol without intravenous contrast. COMPARISON:  Plain films today FINDINGS: Urinary Tract:  No abnormality visualized. Bowel:  Unremarkable visualized pelvic bowel loops. Vascular/Lymphatic: Aortic and iliac calcifications. No aneurysm or adenopathy. Reproductive:  Prior hysterectomy.  No adnexal masses. Other:  No free fluid or free air. Musculoskeletal: There is a minimally displaced fracture through the right inferior pubic ramus. Nondisplaced fracture also seen within the ischium at  the anteromedial acetabulum. No proximal femoral fracture. No subluxation or dislocation. IMPRESSION: Fractures through the right inferior pubic ramus and right ischium/anteromedial acetabulum. No proximal femoral abnormality. Electronically Signed   By: Rolm Baptise M.D.   On: 12/20/2019 20:09   DG Hip Unilat With Pelvis 2-3 Views Right  Result Date: 12/20/2019 CLINICAL DATA:  84 year old female with fall and hip pain. EXAM: DG HIP (WITH OR WITHOUT PELVIS) 2-3V RIGHT COMPARISON:  CT of the abdomen pelvis dated 06/10/2019. FINDINGS: There is no acute fracture or dislocation. The bones are osteopenic. There is mild to moderate arthritic changes of the hips bilaterally. There is degenerative changes of the lower lumbar spine. L4 vertebroplasty. The soft tissues are unremarkable. IMPRESSION: No acute fracture or dislocation. Electronically Signed   By: Anner Crete M.D.   On: 12/20/2019 18:34     Scheduled Meds: . amLODipine  10 mg Oral Daily  . aspirin EC  81 mg Oral QHS  . darifenacin  7.5 mg Oral Daily  . denosumab  60 mg Subcutaneous Q6 months  . DULoxetine  30 mg Oral BID  . gabapentin  400 mg Oral TID  . heparin  5,000 Units Subcutaneous Q8H  .  ketorolac  15 mg Intravenous Q6H  . metoprolol tartrate  25 mg Oral QHS  . mometasone-formoterol  2 puff Inhalation BID  . oxybutynin  10 mg Oral QHS  . pantoprazole  40 mg Oral Daily  . rOPINIRole  2 mg Oral TID  . senna-docusate  2 tablet Oral BID  . sucralfate  1 g Oral QID   Continuous Infusions:   LOS: 1 day    Time spent: [place], MD [facility] To contact the attending provider between 7A-7P or the covering provider during after hours 7P-7A, please log into the web site [email or web address] and access using universal [place] password for that web site. If you do not have the password, please call the hospital operator.  12/21/2019, 8:24 AM

## 2019-12-21 NOTE — ED Notes (Signed)
ED TO INPATIENT HANDOFF REPORT  ED Nurse Name and Phone #: [phone]  S Name/Age/Gender [name] [name] 84 y.o. female Room/Bed: WA16/WA16  Code Status   Code Status: Full Code  Home/SNF/Other Home Patient oriented to: self, place, time and situation Is this baseline? Yes   Triage Complete: Triage complete  Chief Complaint Pubic ramus fracture [facility]) [S32.599A]  Triage Note Arrived by EMS from home. Patient reports she was taking her husband some tea and "misstepped" Patient denies any dizziness, did not hit her head, denies loss of consciousness. Patient reports right hip pain. Patient received 100 mcg Fentanyl en route to this facility.  BP: 161/90 P: 80 R:16 O2: 95% room air    Allergies Allergies  Allergen Reactions  . Ace Inhibitors Cough    Per Dr Melvyn Novas pulmonology 2013  . Aspartame Diarrhea and Nausea And Vomiting  . Atorvastatin Other (See Comments)    Muscle aches  . Biaxin [Clarithromycin] Other (See Comments)    Unknown reaction : PATIENT CAN TOLERATE Z-PAK.  Is written on patient's paper chart.  [name] [Roflumilast] Nausea And Vomiting  . Erythromycin Other (See Comments)    Unknown reaction: PATIENT CAN TOLERATE Z-PAK.  It is written on patient's paper chart.  . Levofloxacin Nausea And Vomiting    Level of Care/Admitting Diagnosis ED Disposition    ED Disposition Condition Comment   Admit  Hospital Area: [facility] [[id number]]  Level of Care: Telemetry [5]  Admit to tele based on following criteria: Monitor QTC interval  May admit patient to [facility] or [facility] if equivalent level of care is available:: No  Covid Evaluation: Asymptomatic Screening Protocol (No Symptoms)  Diagnosis: Pubic ramus fracture [facility]) [[id number]]  Admitting Physician: Rolla Plate [[id number]]  Attending Physician: Rolla Plate [[id number]]  Estimated length of stay: past midnight tomorrow  Certification:: I certify this patient will need  inpatient services for at least 2 midnights       B Medical/Surgery History Past Medical History:  Diagnosis Date  . Anxiety    takes Xanax daily as needed  . Asthma    Albuterol daily as needed  . Blood transfusion without reported diagnosis   . CAD (coronary artery disease)    LHC 2003 with 40% pLAD, 30% mLAD, 100% D1 (moderate vessel), 100%  D2 (small vessel), 95% mid small OM2.  Pt had PTCA to D1;  D2 and OM2 small vessels and tx medically;  Last Myoview (2012): anterior infarct seen with scar, no ischemia. EF normal. Med Rx // Myoview (12/14):  Low risk; ant scar w/ peri-infarct ischemia; EF 55% with ant AK // Myoview 5/16: EF 55-65, ant, apical scar, no ischemia   . Cataract   . Chronic back pain    HNP, DDD, spinal stenosis, vertebral compression fractures.   . Chronic nausea    takes Zofran daily as needed.  normal gastric emptying study 03/2013  . CKD (chronic kidney disease), stage III 09/27/2017  . Constipation    felt to be functional. takes Senokot and Miralax daily.  treated with Linzess in past.   . DIVERTICULOSIS, COLON 08/28/2008   Qualifier: Diagnosis of  By: [name], [name], [name]    . Elevated LFTs 2015   probably med induced DILI, from Augmentin vs Pravastatin  . GERD (gastroesophageal reflux disease)    hx of esophageal stricture   . Hiatal hernia    Paraesophageal hernias. s/p fundoplications in 3016 and 04/2013  . History of bronchitis several  of yrs ago  . History of colon polyps 03/2009, 03/2011   adenomatous colon polyps, no high grade dysplasia.   [name] History of vertigo    no meds  . Hyperlipidemia    takes Pravastatin daily  . Hypertension    takes Amlodipine,Metoprolol,and Losartan daily  . Nausea 03/2016  . Osteoporosis   . PONV (postoperative nausea and vomiting)    yrs ago  . Slow urinary stream    takes Rapaflo daily  . TIA (transient ischemic attack) 1995   no residual effects noted   Past Surgical History:  Procedure Laterality Date  .  APPENDECTOMY     patient unsure of date  . BACK SURGERY    . BLADDER REPAIR    . CHOLECYSTECTOMY     Patient unsure of date.  . CORONARY ANGIOPLASTY  11/16/2001   1 stent  . ESOPHAGEAL MANOMETRY N/A 03/25/2013   Procedure: ESOPHAGEAL MANOMETRY (EM);  Surgeon: Inda Castle, MD;  Location: [facility];  Service: Endoscopy;  Laterality: N/A;  . ESOPHAGOGASTRODUODENOSCOPY N/A 03/15/2013   Procedure: ESOPHAGOGASTRODUODENOSCOPY (EGD);  Surgeon: Jerene Bears, MD;  Location: [facility] ENDOSCOPY;  Service: Endoscopy;  Laterality: N/A;  . ESOPHAGOGASTRODUODENOSCOPY N/A 12/25/2015   Procedure: ESOPHAGOGASTRODUODENOSCOPY (EGD);  Surgeon: Doran Stabler, MD;  Location: [facility] ENDOSCOPY;  Service: Endoscopy;  Laterality: N/A;  . EYE SURGERY  11/2000   bilateral cataracts with lens implant  . FIXATION KYPHOPLASTY LUMBAR SPINE  X 2   "L3-4"  . HEMORRHOID SURGERY    . HIATAL HERNIA REPAIR  06/03/2011   Procedure: LAPAROSCOPIC REPAIR OF HIATAL HERNIA;  Surgeon: Adin Hector, MD;  Location: [facility];  Service: General;  Laterality: N/A;  . HIATAL HERNIA REPAIR N/A 04/24/2013   Procedure: LAPAROSCOPIC  REPAIR RECURRENT PARASOPHAGEAL HIATAL HERNIA WITH FUNDOPLICATION;  Surgeon: Adin Hector, MD;  Location: [facility];  Service: General;  Laterality: N/A;  . INSERTION OF MESH N/A 04/24/2013   Procedure: INSERTION OF MESH;  Surgeon: Adin Hector, MD;  Location: [facility];  Service: General;  Laterality: N/A;  . IR KYPHO THORACIC WITH BONE BIOPSY  09/29/2017  . IR KYPHO THORACIC WITH BONE BIOPSY  11/03/2017  . IR KYPHO THORACIC WITH BONE BIOPSY  02/14/2019  . IR KYPHO THORACIC WITH BONE BIOPSY  04/02/2019  . LAPAROSCOPIC LYSIS OF ADHESIONS N/A 04/24/2013   Procedure: LAPAROSCOPIC LYSIS OF ADHESIONS;  Surgeon: Adin Hector, MD;  Location: [facility];  Service: General;  Laterality: N/A;  . LAPAROSCOPIC NISSEN FUNDOPLICATION  3/89/3734   Procedure: LAPAROSCOPIC NISSEN FUNDOPLICATION;  Surgeon: Adin Hector, MD;   Location: [facility];  Service: General;  Laterality: N/A;  . LUMBAR FUSION  12/07/2015   Right L2-3 facetectomy, posterolateral fusion L2-3 with pedicle screws, rods, local bone graft, Vivigen cancellous chips. Fusion extended to the L1 level with pedicle screws and rods  . LUMBAR LAMINECTOMY/DECOMPRESSION MICRODISCECTOMY Right 06/26/2012   Procedure: LUMBAR LAMINECTOMY/DECOMPRESSION MICRODISCECTOMY;  Surgeon: Jessy Oto, MD;  Location: [place];  Service: Orthopedics;  Laterality: Right;  RIGHT L4-5 MICRODISCECTOMY  . LUMBAR LAMINECTOMY/DECOMPRESSION MICRODISCECTOMY N/A 05/29/2015   Procedure: RIGHT L2-3 MICRODISCECTOMY;  Surgeon: Jessy Oto, MD;  Location: [place];  Service: Orthopedics;  Laterality: N/A;  . TOTAL ABDOMINAL HYSTERECTOMY  1975   partial  . UPPER GASTROINTESTINAL ENDOSCOPY       A IV Location/Drains/Wounds Patient Lines/Drains/Airways Status    Active Line/Drains/Airways    Name Placement date Placement time Site Days   Peripheral IV 12/20/19  Left;Posterior Hand 12/20/19  1643  Hand  1   Incision (Closed) 11/03/17 Back Upper 11/03/17  1406   778   Incision (Closed) 02/14/19 Back Mid 02/14/19  1003   310   Incision (Closed) 04/02/19 Back Mid 04/02/19  0936   263          Intake/Output Last 24 hours No intake or output data in the 24 hours ending 12/21/19 0851  Labs/Imaging Results for orders placed or performed during the hospital encounter of 12/20/19 (from the past 48 hour(s))  Basic metabolic panel     Status: Abnormal   Collection Time: 12/20/19  8:52 PM  Result Value Ref Range   Sodium 138 135 - 145 mmol/L   Potassium 3.8 3.5 - 5.1 mmol/L   Chloride 105 98 - 111 mmol/L   CO2 23 22 - 32 mmol/L   Glucose, Bld 91 70 - 99 mg/dL    Comment: Glucose reference range applies only to samples taken after fasting for at least 8 hours.   BUN 22 8 - 23 mg/dL   Creatinine, Ser 0.73 0.44 - 1.00 mg/dL   Calcium 8.4 (L) 8.9 - 10.3 mg/dL   GFR calc non Af Amer >60 >60 mL/min    GFR calc Af Amer >60 >60 mL/min   Anion gap 10 5 - 15    Comment: Performed at [facility], [place] [address], [place], [place] [address]  CBC with Differential     Status: Abnormal   Collection Time: 12/20/19  8:52 PM  Result Value Ref Range   WBC 23.1 (H) 4.0 - 10.5 K/uL   RBC 4.38 3.87 - 5.11 MIL/uL   Hemoglobin 13.6 12.0 - 15.0 g/dL   HCT 41.7 36 - 46 %   MCV 95.2 80.0 - 100.0 fL   MCH 31.1 26.0 - 34.0 pg   MCHC 32.6 30.0 - 36.0 g/dL   RDW 14.5 11.5 - 15.5 %   Platelets 416 (H) 150 - 400 K/uL   nRBC 0.0 0.0 - 0.2 %   Neutrophils Relative % 84 %   Neutro Abs 19.3 (H) 1.7 - 7.7 K/uL   Lymphocytes Relative 9 %   Lymphs Abs 2.1 0.7 - 4.0 K/uL   Monocytes Relative 6 %   Monocytes Absolute 1.4 (H) 0 - 1 K/uL   Eosinophils Relative 0 %   Eosinophils Absolute 0.0 0 - 0 K/uL   Basophils Relative 0 %   Basophils Absolute 0.1 0 - 0 K/uL   Immature Granulocytes 1 %   Abs Immature Granulocytes 0.29 (H) 0.00 - 0.07 K/uL    Comment: Performed at [facility], [place] [address] [place], [place] [address]  Urinalysis, Routine w reflex microscopic     Status: None   Collection Time: 12/20/19  8:52 PM  Result Value Ref Range   Color, Urine YELLOW YELLOW   APPearance CLEAR CLEAR   Specific Gravity, Urine 1.013 1.005 - 1.030   pH 7.0 5.0 - 8.0   Glucose, UA NEGATIVE NEGATIVE mg/dL   Hgb urine dipstick NEGATIVE NEGATIVE   Bilirubin Urine NEGATIVE NEGATIVE   Ketones, ur NEGATIVE NEGATIVE mg/dL   Protein, ur NEGATIVE NEGATIVE mg/dL   Nitrite NEGATIVE NEGATIVE   Leukocytes,Ua NEGATIVE NEGATIVE    Comment: Performed at [place] [address], [place], [place] [address]  SARS Coronavirus 2 by RT PCR (hospital order, performed in [facility] hospital lab) Nasopharyngeal     Status: None  Collection Time: 12/20/19  8:52 PM   Specimen: Nasopharyngeal  Result Value Ref Range   SARS Coronavirus 2 NEGATIVE NEGATIVE    Comment:  (NOTE) SARS-CoV-2 target nucleic acids are NOT DETECTED.  The SARS-CoV-2 RNA is generally detectable in upper and lower respiratory specimens during the acute phase of infection. The lowest concentration of SARS-CoV-2 viral copies this assay can detect is 250 copies / mL. A negative result does not preclude SARS-CoV-2 infection and should not be used as the sole basis for treatment or other patient management decisions.  A negative result may occur with improper specimen collection / handling, submission of specimen other than nasopharyngeal swab, presence of viral mutation(s) within the areas targeted by this assay, and inadequate number of viral copies (<250 copies / mL). A negative result must be combined with clinical observations, patient history, and epidemiological information.  Fact Sheet for Patients:   StrictlyIdeas.no  Fact Sheet for Healthcare Providers: BankingDealers.co.za  This test is not yet approved or  cleared by the [place] FDA and has been authorized for detection and/or diagnosis of SARS-CoV-2 by FDA under an Emergency Use Authorization (EUA).  This EUA will remain in effect (meaning this test can be used) for the duration of the COVID-19 declaration under Section 564(b)(1) of the Act, 21 U.S.C. section 360bbb-3(b)(1), unless the authorization is terminated or revoked sooner.  Performed at [facility], [place] [address], [place], [place] [address]   Troponin I (High Sensitivity)     Status: None   Collection Time: 12/20/19 10:01 PM  Result Value Ref Range   Troponin I (High Sensitivity) 6 <18 ng/L    Comment: (NOTE) Elevated high sensitivity troponin I (hsTnI) values and significant  changes across serial measurements may suggest ACS but many other  chronic and acute conditions are known to elevate hsTnI results.  Refer to the "Links" section for chest pain algorithms and additional   guidance. Performed at [facility], [place] [address] [place], [place] [address]   Troponin I (High Sensitivity)     Status: None   Collection Time: 12/20/19 11:50 PM  Result Value Ref Range   Troponin I (High Sensitivity) 2 <18 ng/L    Comment: (NOTE) Elevated high sensitivity troponin I (hsTnI) values and significant  changes across serial measurements may suggest ACS but many other  chronic and acute conditions are known to elevate hsTnI results.  Refer to the "Links" section for chest pain algorithms and additional  guidance. Performed at [facility], [place] [address] [place], [place] [address]    *Note: Due to a large number of results and/or encounters for the requested time period, some results have not been displayed. A complete set of results can be found in Results Review.   DG Chest 2 View  Result Date: 12/20/2019 CLINICAL DATA:  Chest pain.  Fall. EXAM: CHEST - 2 VIEW COMPARISON:  Chest radiograph 11/29/2019, CT 04/29/2019 FINDINGS: Mild chronic cardiomegaly. Unchanged mediastinal contours. Improved linear opacity at the right lung base. No acute airspace disease. No pneumothorax or large pleural effusion. No pulmonary edema. Degenerative change in the left shoulder. Kyphoplasty in the lower thoracic spine is partially visualized. IMPRESSION: No acute chest findings. Mild chronic cardiomegaly. Electronically Signed   By: Keith Rake M.D.   On: 12/20/2019 22:20   CT PELVIS WO CONTRAST  Result Date: 12/20/2019 CLINICAL DATA:  Fall, right hip pain EXAM: CT PELVIS WITHOUT CONTRAST TECHNIQUE: Multidetector CT imaging of the pelvis was  performed following the standard protocol without intravenous contrast. COMPARISON:  Plain films today FINDINGS: Urinary Tract:  No abnormality visualized. Bowel:  Unremarkable visualized pelvic bowel loops. Vascular/Lymphatic: Aortic and iliac calcifications. No aneurysm or adenopathy. Reproductive:  Prior  hysterectomy.  No adnexal masses. Other:  No free fluid or free air. Musculoskeletal: There is a minimally displaced fracture through the right inferior pubic ramus. Nondisplaced fracture also seen within the ischium at the anteromedial acetabulum. No proximal femoral fracture. No subluxation or dislocation. IMPRESSION: Fractures through the right inferior pubic ramus and right ischium/anteromedial acetabulum. No proximal femoral abnormality. Electronically Signed   By: Rolm Baptise M.D.   On: 12/20/2019 20:09   DG Hip Unilat With Pelvis 2-3 Views Right  Result Date: 12/20/2019 CLINICAL DATA:  84 year old female with fall and hip pain. EXAM: DG HIP (WITH OR WITHOUT PELVIS) 2-3V RIGHT COMPARISON:  CT of the abdomen pelvis dated 06/10/2019. FINDINGS: There is no acute fracture or dislocation. The bones are osteopenic. There is mild to moderate arthritic changes of the hips bilaterally. There is degenerative changes of the lower lumbar spine. L4 vertebroplasty. The soft tissues are unremarkable. IMPRESSION: No acute fracture or dislocation. Electronically Signed   By: Anner Crete M.D.   On: 12/20/2019 18:34    Pending Labs Unresulted Labs (From admission, onward) Comment          Start     Ordered   12/21/19 0845  Magnesium  Tomorrow morning,   R        12/21/19 0845   12/21/19 0845  CBC  Tomorrow morning,   R        12/21/19 0845   12/21/19 0845  Comprehensive metabolic panel  Tomorrow morning,   R        12/21/19 0845          Vitals/Pain Today's Vitals   12/21/19 0541 12/21/19 0618 12/21/19 0736 12/21/19 0736  BP:  131/70    Pulse:  79    Resp:  (!) 26    Temp:      TempSrc:      SpO2:  91%    PainSc: 4   8  8      Isolation Precautions No active isolations  Medications Medications  morphine 4 MG/ML injection 4 mg (4 mg Intravenous Given 12/21/19 0446)  HYDROcodone-acetaminophen (NORCO) 7.5-325 MG per tablet 1 tablet (1 tablet Oral Given 12/21/19 0626)   HYDROcodone-acetaminophen (NORCO/VICODIN) 5-325 MG per tablet 1 tablet (has no administration in time range)  senna-docusate (Senokot-S) tablet 2 tablet (has no administration in time range)  gabapentin (NEURONTIN) tablet 400 mg (has no administration in time range)  DULoxetine (CYMBALTA) DR capsule 30 mg (has no administration in time range)  oxybutynin (DITROPAN-XL) 24 hr tablet 10 mg (has no administration in time range)  aspirin EC tablet 81 mg (has no administration in time range)  amLODipine (NORVASC) tablet 10 mg (has no administration in time range)  metoprolol tartrate (LOPRESSOR) tablet 25 mg (has no administration in time range)  denosumab (PROLIA) injection 60 mg (has no administration in time range)  pantoprazole (PROTONIX) EC tablet 40 mg (has no administration in time range)  sucralfate (CARAFATE) tablet 1 g (has no administration in time range)  darifenacin (ENABLEX) 24 hr tablet 7.5 mg (has no administration in time range)  rOPINIRole (REQUIP) tablet 2 mg (has no administration in time range)  mometasone-formoterol (DULERA) 200-5 MCG/ACT inhaler 2 puff (has no administration in time range)  ipratropium-albuterol (DUONEB) 0.5-2.5 (3)  MG/3ML nebulizer solution 3 mL (has no administration in time range)  heparin injection 5,000 Units (has no administration in time range)  polyethylene glycol (MIRALAX / GLYCOLAX) packet 17 g (has no administration in time range)  ondansetron (ZOFRAN) tablet 4 mg (has no administration in time range)    Or  ondansetron (ZOFRAN) injection 4 mg (has no administration in time range)  ketorolac (TORADOL) 30 MG/ML injection 15 mg (has no administration in time range)  ketorolac (TORADOL) 30 MG/ML injection 15 mg (15 mg Intravenous Given 12/20/19 1934)  morphine 4 MG/ML injection 4 mg (4 mg Intravenous Given 12/20/19 2048)  ondansetron (ZOFRAN) injection 4 mg (4 mg Intravenous Given 12/20/19 2048)    Mobility walks Moderate fall risk   Focused  Assessments .   R Recommendations: See Admitting Provider Note  Report given to:   Additional Notes: n/a

## 2019-12-21 NOTE — Consult Note (Signed)
Reason for Consult:fall with closed minimal/nondisplaced right rami fracture Referring Physician: Verlon Au MD  [name] [name] is an 84 y.o. female.  HPI: 84 year old female was bring some to to her husband who was out in the garage and slipped on a step falling.  She extreme pain inability to ambulate and initial x-rays were negative follow-up CT scan of the pelvis demonstrated a pubic rami fracture inferiorly as well as superiorly that extended into the anterior aspect of the acetabulum without significant displacement and not involving the anterior dome weightbearing surface.  No subluxation of the hip.  Patient's complained of pain and spasms.  Additional history of coronary disease chronic back pain problems.  She seen our practice for many years as well as her husband is a patient of our practice.  Past Medical History:  Diagnosis Date  . Anxiety    takes Xanax daily as needed  . Asthma    Albuterol daily as needed  . Blood transfusion without reported diagnosis   . CAD (coronary artery disease)    LHC 2003 with 40% pLAD, 30% mLAD, 100% D1 (moderate vessel), 100%  D2 (small vessel), 95% mid small OM2.  Pt had PTCA to D1;  D2 and OM2 small vessels and tx medically;  Last Myoview (2012): anterior infarct seen with scar, no ischemia. EF normal. Med Rx // Myoview (12/14):  Low risk; ant scar w/ peri-infarct ischemia; EF 55% with ant AK // Myoview 5/16: EF 55-65, ant, apical scar, no ischemia   . Cataract   . Chronic back pain    HNP, DDD, spinal stenosis, vertebral compression fractures.   . Chronic nausea    takes Zofran daily as needed.  normal gastric emptying study 03/2013  . CKD (chronic kidney disease), stage III 09/27/2017  . Constipation    felt to be functional. takes Senokot and Miralax daily.  treated with Linzess in past.   . DIVERTICULOSIS, COLON 08/28/2008   Qualifier: Diagnosis of  By: [name], [name], [name]    . Elevated LFTs 2015   probably med induced DILI, from Augmentin  vs Pravastatin  . GERD (gastroesophageal reflux disease)    hx of esophageal stricture   . Hiatal hernia    Paraesophageal hernias. s/p fundoplications in 3500 and 04/2013  . History of bronchitis several of yrs ago  . History of colon polyps 03/2009, 03/2011   adenomatous colon polyps, no high grade dysplasia.   [name] History of vertigo    no meds  . Hyperlipidemia    takes Pravastatin daily  . Hypertension    takes Amlodipine,Metoprolol,and Losartan daily  . Nausea 03/2016  . Osteoporosis   . PONV (postoperative nausea and vomiting)    yrs ago  . Slow urinary stream    takes Rapaflo daily  . TIA (transient ischemic attack) 1995   no residual effects noted    Past Surgical History:  Procedure Laterality Date  . APPENDECTOMY     patient unsure of date  . BACK SURGERY    . BLADDER REPAIR    . CHOLECYSTECTOMY     Patient unsure of date.  . CORONARY ANGIOPLASTY  11/16/2001   1 stent  . ESOPHAGEAL MANOMETRY N/A 03/25/2013   Procedure: ESOPHAGEAL MANOMETRY (EM);  Surgeon: Inda Castle, MD;  Location: [facility];  Service: Endoscopy;  Laterality: N/A;  . ESOPHAGOGASTRODUODENOSCOPY N/A 03/15/2013   Procedure: ESOPHAGOGASTRODUODENOSCOPY (EGD);  Surgeon: Jerene Bears, MD;  Location: [facility] ENDOSCOPY;  Service: Endoscopy;  Laterality: N/A;  . ESOPHAGOGASTRODUODENOSCOPY  N/A 12/25/2015   Procedure: ESOPHAGOGASTRODUODENOSCOPY (EGD);  Surgeon: Doran Stabler, MD;  Location: [facility] ENDOSCOPY;  Service: Endoscopy;  Laterality: N/A;  . EYE SURGERY  11/2000   bilateral cataracts with lens implant  . FIXATION KYPHOPLASTY LUMBAR SPINE  X 2   "L3-4"  . HEMORRHOID SURGERY    . HIATAL HERNIA REPAIR  06/03/2011   Procedure: LAPAROSCOPIC REPAIR OF HIATAL HERNIA;  Surgeon: Adin Hector, MD;  Location: [facility];  Service: General;  Laterality: N/A;  . HIATAL HERNIA REPAIR N/A 04/24/2013   Procedure: LAPAROSCOPIC  REPAIR RECURRENT PARASOPHAGEAL HIATAL HERNIA WITH FUNDOPLICATION;  Surgeon: Adin Hector, MD;  Location: [facility];  Service: General;  Laterality: N/A;  . INSERTION OF MESH N/A 04/24/2013   Procedure: INSERTION OF MESH;  Surgeon: Adin Hector, MD;  Location: [facility];  Service: General;  Laterality: N/A;  . IR KYPHO THORACIC WITH BONE BIOPSY  09/29/2017  . IR KYPHO THORACIC WITH BONE BIOPSY  11/03/2017  . IR KYPHO THORACIC WITH BONE BIOPSY  02/14/2019  . IR KYPHO THORACIC WITH BONE BIOPSY  04/02/2019  . LAPAROSCOPIC LYSIS OF ADHESIONS N/A 04/24/2013   Procedure: LAPAROSCOPIC LYSIS OF ADHESIONS;  Surgeon: Adin Hector, MD;  Location: [facility];  Service: General;  Laterality: N/A;  . LAPAROSCOPIC NISSEN FUNDOPLICATION  7/61/9509   Procedure: LAPAROSCOPIC NISSEN FUNDOPLICATION;  Surgeon: Adin Hector, MD;  Location: [facility];  Service: General;  Laterality: N/A;  . LUMBAR FUSION  12/07/2015   Right L2-3 facetectomy, posterolateral fusion L2-3 with pedicle screws, rods, local bone graft, Vivigen cancellous chips. Fusion extended to the L1 level with pedicle screws and rods  . LUMBAR LAMINECTOMY/DECOMPRESSION MICRODISCECTOMY Right 06/26/2012   Procedure: LUMBAR LAMINECTOMY/DECOMPRESSION MICRODISCECTOMY;  Surgeon: Jessy Oto, MD;  Location: [place];  Service: Orthopedics;  Laterality: Right;  RIGHT L4-5 MICRODISCECTOMY  . LUMBAR LAMINECTOMY/DECOMPRESSION MICRODISCECTOMY N/A 05/29/2015   Procedure: RIGHT L2-3 MICRODISCECTOMY;  Surgeon: Jessy Oto, MD;  Location: [place];  Service: Orthopedics;  Laterality: N/A;  . TOTAL ABDOMINAL HYSTERECTOMY  1975   partial  . UPPER GASTROINTESTINAL ENDOSCOPY      Family History  Problem Relation Age of Onset  . Colon cancer Mother 65  . Heart attack Father 64       heart attack  . Brain cancer Brother        tumor   . Heart disease Brother   . Heart disease Brother   . Kidney disease Daughter   . Esophageal cancer Neg Hx   . Stomach cancer Neg Hx     Social History:  reports that she has never smoked. She has never used smokeless  tobacco. She reports that she does not drink alcohol and does not use drugs.  Allergies:  Allergies  Allergen Reactions  . Ace Inhibitors Cough    Per Dr Melvyn Novas pulmonology 2013  . Aspartame Diarrhea and Nausea And Vomiting  . Atorvastatin Other (See Comments)    Muscle aches  . Biaxin [Clarithromycin] Other (See Comments)    Unknown reaction : PATIENT CAN TOLERATE Z-PAK.  Is written on patient's paper chart.  [name] [Roflumilast] Nausea And Vomiting  . Erythromycin Other (See Comments)    Unknown reaction: PATIENT CAN TOLERATE Z-PAK.  It is written on patient's paper chart.  . Levofloxacin Nausea And Vomiting    Medications: I have reviewed the patient's current medications.  Results for orders placed or performed during the hospital encounter of 12/20/19 (from the past  48 hour(s))  Basic metabolic panel     Status: Abnormal   Collection Time: 12/20/19  8:52 PM  Result Value Ref Range   Sodium 138 135 - 145 mmol/L   Potassium 3.8 3.5 - 5.1 mmol/L   Chloride 105 98 - 111 mmol/L   CO2 23 22 - 32 mmol/L   Glucose, Bld 91 70 - 99 mg/dL    Comment: Glucose reference range applies only to samples taken after fasting for at least 8 hours.   BUN 22 8 - 23 mg/dL   Creatinine, Ser 0.73 0.44 - 1.00 mg/dL   Calcium 8.4 (L) 8.9 - 10.3 mg/dL   GFR calc non Af Amer >60 >60 mL/min   GFR calc Af Amer >60 >60 mL/min   Anion gap 10 5 - 15    Comment: Performed at [facility], [place] [address] [place], [place] [address]  CBC with Differential     Status: Abnormal   Collection Time: 12/20/19  8:52 PM  Result Value Ref Range   WBC 23.1 (H) 4.0 - 10.5 K/uL   RBC 4.38 3.87 - 5.11 MIL/uL   Hemoglobin 13.6 12.0 - 15.0 g/dL   HCT 41.7 36 - 46 %   MCV 95.2 80.0 - 100.0 fL   MCH 31.1 26.0 - 34.0 pg   MCHC 32.6 30.0 - 36.0 g/dL   RDW 14.5 11.5 - 15.5 %   Platelets 416 (H) 150 - 400 K/uL   nRBC 0.0 0.0 - 0.2 %   Neutrophils Relative % 84 %   Neutro Abs 19.3 (H) 1.7 - 7.7  K/uL   Lymphocytes Relative 9 %   Lymphs Abs 2.1 0.7 - 4.0 K/uL   Monocytes Relative 6 %   Monocytes Absolute 1.4 (H) 0 - 1 K/uL   Eosinophils Relative 0 %   Eosinophils Absolute 0.0 0 - 0 K/uL   Basophils Relative 0 %   Basophils Absolute 0.1 0 - 0 K/uL   Immature Granulocytes 1 %   Abs Immature Granulocytes 0.29 (H) 0.00 - 0.07 K/uL    Comment: Performed at [facility], [place] [address] [place], [place] [address]  Urinalysis, Routine w reflex microscopic     Status: None   Collection Time: 12/20/19  8:52 PM  Result Value Ref Range   Color, Urine YELLOW YELLOW   APPearance CLEAR CLEAR   Specific Gravity, Urine 1.013 1.005 - 1.030   pH 7.0 5.0 - 8.0   Glucose, UA NEGATIVE NEGATIVE mg/dL   Hgb urine dipstick NEGATIVE NEGATIVE   Bilirubin Urine NEGATIVE NEGATIVE   Ketones, ur NEGATIVE NEGATIVE mg/dL   Protein, ur NEGATIVE NEGATIVE mg/dL   Nitrite NEGATIVE NEGATIVE   Leukocytes,Ua NEGATIVE NEGATIVE    Comment: Performed at [place] [address] [place], [place] [address]  SARS Coronavirus 2 by RT PCR (hospital order, performed in [facility] hospital lab) Nasopharyngeal     Status: None   Collection Time: 12/20/19  8:52 PM   Specimen: Nasopharyngeal  Result Value Ref Range   SARS Coronavirus 2 NEGATIVE NEGATIVE    Comment: (NOTE) SARS-CoV-2 target nucleic acids are NOT DETECTED.  The SARS-CoV-2 RNA is generally detectable in upper and lower respiratory specimens during the acute phase of infection. The lowest concentration of SARS-CoV-2 viral copies this assay can detect is 250 copies / mL. A negative result does not preclude SARS-CoV-2 infection and should not be used as the sole basis for treatment or other  patient management decisions.  A negative result may occur with improper specimen collection / handling, submission of specimen other than nasopharyngeal swab, presence of viral mutation(s) within the areas targeted by this  assay, and inadequate number of viral copies (<250 copies / mL). A negative result must be combined with clinical observations, patient history, and epidemiological information.  Fact Sheet for Patients:   StrictlyIdeas.no  Fact Sheet for Healthcare Providers: BankingDealers.co.za  This test is not yet approved or  cleared by the [place] FDA and has been authorized for detection and/or diagnosis of SARS-CoV-2 by FDA under an Emergency Use Authorization (EUA).  This EUA will remain in effect (meaning this test can be used) for the duration of the COVID-19 declaration under Section 564(b)(1) of the Act, 21 U.S.C. section 360bbb-3(b)(1), unless the authorization is terminated or revoked sooner.  Performed at [facility], [place] [address], [place], [place] [address]   Troponin I (High Sensitivity)     Status: None   Collection Time: 12/20/19 10:01 PM  Result Value Ref Range   Troponin I (High Sensitivity) 6 <18 ng/L    Comment: (NOTE) Elevated high sensitivity troponin I (hsTnI) values and significant  changes across serial measurements may suggest ACS but many other  chronic and acute conditions are known to elevate hsTnI results.  Refer to the "Links" section for chest pain algorithms and additional  guidance. Performed at [facility], [place] [address] [place], [place] [address]   Troponin I (High Sensitivity)     Status: None   Collection Time: 12/20/19 11:50 PM  Result Value Ref Range   Troponin I (High Sensitivity) 2 <18 ng/L    Comment: (NOTE) Elevated high sensitivity troponin I (hsTnI) values and significant  changes across serial measurements may suggest ACS but many other  chronic and acute conditions are known to elevate hsTnI results.  Refer to the "Links" section for chest pain algorithms and additional  guidance. Performed at [facility], [place] [address], [place], [place] [address]   Magnesium     Status: None   Collection Time: 12/21/19 10:13 AM  Result Value Ref Range   Magnesium 2.2 1.7 - 2.4 mg/dL    Comment: Performed at [facility], [place] [address], [place], [place] [address]  CBC     Status: None   Collection Time: 12/21/19 10:13 AM  Result Value Ref Range   WBC 10.2 4.0 - 10.5 K/uL   RBC 4.35 3.87 - 5.11 MIL/uL   Hemoglobin 13.6 12.0 - 15.0 g/dL   HCT 42.3 36 - 46 %   MCV 97.2 80.0 - 100.0 fL   MCH 31.3 26.0 - 34.0 pg   MCHC 32.2 30.0 - 36.0 g/dL   RDW 14.8 11.5 - 15.5 %   Platelets 366 150 - 400 K/uL   nRBC 0.0 0.0 - 0.2 %    Comment: Performed at [facility], [place] [address] [place], [place] [address]  Comprehensive metabolic panel     Status: Abnormal   Collection Time: 12/21/19 10:13 AM  Result Value Ref Range   Sodium 139 135 - 145 mmol/L   Potassium 4.0 3.5 - 5.1 mmol/L   Chloride 105 98 - 111 mmol/L   CO2 25 22 - 32 mmol/L   Glucose, Bld 106 (H) 70 - 99 mg/dL    Comment: Glucose reference range applies only to samples taken after fasting for at least 8 hours.   BUN  20 8 - 23 mg/dL   Creatinine, Ser 0.68 0.44 - 1.00 mg/dL   Calcium 8.0 (L) 8.9 - 10.3 mg/dL   Total Protein 6.4 (L) 6.5 - 8.1 g/dL   Albumin 3.8 3.5 - 5.0 g/dL   AST 871 (H) 15 - 41 U/L   ALT 621 (H) 0 - 44 U/L   Alkaline Phosphatase 184 (H) 38 - 126 U/L   Total Bilirubin 3.5 (H) 0.3 - 1.2 mg/dL   GFR calc non Af Amer >60 >60 mL/min   GFR calc Af Amer >60 >60 mL/min   Anion gap 9 5 - 15    Comment: Performed at [facility], [place] [address], [place], [place] [address]   *Note: Due to a large number of results and/or encounters for the requested time period, some results have not been displayed. A complete set of results can be found in Results Review.    DG Chest 2 View  Result Date: 12/20/2019 CLINICAL DATA:  Chest pain.  Fall. EXAM: CHEST - 2 VIEW COMPARISON:  Chest radiograph  11/29/2019, CT 04/29/2019 FINDINGS: Mild chronic cardiomegaly. Unchanged mediastinal contours. Improved linear opacity at the right lung base. No acute airspace disease. No pneumothorax or large pleural effusion. No pulmonary edema. Degenerative change in the left shoulder. Kyphoplasty in the lower thoracic spine is partially visualized. IMPRESSION: No acute chest findings. Mild chronic cardiomegaly. Electronically Signed   By: Keith Rake M.D.   On: 12/20/2019 22:20   CT PELVIS WO CONTRAST  Result Date: 12/20/2019 CLINICAL DATA:  Fall, right hip pain EXAM: CT PELVIS WITHOUT CONTRAST TECHNIQUE: Multidetector CT imaging of the pelvis was performed following the standard protocol without intravenous contrast. COMPARISON:  Plain films today FINDINGS: Urinary Tract:  No abnormality visualized. Bowel:  Unremarkable visualized pelvic bowel loops. Vascular/Lymphatic: Aortic and iliac calcifications. No aneurysm or adenopathy. Reproductive:  Prior hysterectomy.  No adnexal masses. Other:  No free fluid or free air. Musculoskeletal: There is a minimally displaced fracture through the right inferior pubic ramus. Nondisplaced fracture also seen within the ischium at the anteromedial acetabulum. No proximal femoral fracture. No subluxation or dislocation. IMPRESSION: Fractures through the right inferior pubic ramus and right ischium/anteromedial acetabulum. No proximal femoral abnormality. Electronically Signed   By: Rolm Baptise M.D.   On: 12/20/2019 20:09   DG Hip Unilat With Pelvis 2-3 Views Right  Result Date: 12/20/2019 CLINICAL DATA:  84 year old female with fall and hip pain. EXAM: DG HIP (WITH OR WITHOUT PELVIS) 2-3V RIGHT COMPARISON:  CT of the abdomen pelvis dated 06/10/2019. FINDINGS: There is no acute fracture or dislocation. The bones are osteopenic. There is mild to moderate arthritic changes of the hips bilaterally. There is degenerative changes of the lower lumbar spine. L4 vertebroplasty. The soft  tissues are unremarkable. IMPRESSION: No acute fracture or dislocation. Electronically Signed   By: Anner Crete M.D.   On: 12/20/2019 18:34    ROS Blood pressure 135/60, pulse 75, temperature 98.2 F (36.8 C), temperature source Oral, resp. rate 20, height 5\' 4"  (1.626 m), weight 71.1 kg, SpO2 93 %. Physical Exam Constitutional:      Appearance: Normal appearance.  HENT:     Head: Normocephalic.     Right Ear: Tympanic membrane normal.     Left Ear: Tympanic membrane normal.     Nose: Nose normal.  Eyes:     Extraocular Movements: Extraocular movements intact.  Cardiovascular:     Rate and Rhythm: Normal rate.  Pulses: Normal pulses.  Pulmonary:     Breath sounds: No wheezing.  Musculoskeletal:        General: No deformity.     Cervical back: Normal range of motion.     Right lower leg: No edema.  Skin:    General: Skin is warm.  Neurological:     Mental Status: She is alert.  Psychiatric:        Mood and Affect: Mood normal.        Behavior: Behavior normal.        Thought Content: Thought content normal.     Assessment/Plan: Patient with pelvic fracture right side visualized only on CT scan.  She will not require any surgery.  We discussed after several days she should be able to mobilize with physical therapy and will be ambulation with a walker with weightbearing as tolerated with the right lower extremity.  She can follow-up with my office in 1 to 2 weeks.  I expect by early to midweek she should be able to ambulate well enough that she can be discharged home.my cell [phone]  Marybelle Killings 12/21/2019, 11:43 AM

## 2019-12-22 LAB — RENAL FUNCTION PANEL
Albumin: 3.8 g/dL (ref 3.5–5.0)
Anion gap: 13 (ref 5–15)
BUN: 13 mg/dL (ref 8–23)
CO2: 23 mmol/L (ref 22–32)
Calcium: 8.3 mg/dL — ABNORMAL LOW (ref 8.9–10.3)
Chloride: 97 mmol/L — ABNORMAL LOW (ref 98–111)
Creatinine, Ser: 0.63 mg/dL (ref 0.44–1.00)
GFR calc Af Amer: >60 mL/min (ref >60)
GFR calc non Af Amer: >60 mL/min (ref >60)
Glucose, Bld: 143 mg/dL — ABNORMAL HIGH (ref 70–99)
Phosphorus: 2.4 mg/dL — ABNORMAL LOW (ref 2.5–4.6)
Potassium: 4.3 mmol/L (ref 3.5–5.1)
Sodium: 133 mmol/L — ABNORMAL LOW (ref 135–145)

## 2019-12-22 LAB — CBC WITH DIFFERENTIAL/PLATELET
Abs Immature Granulocytes: 0.14 10*3/uL — ABNORMAL HIGH (ref 0.00–0.07)
Basophils Absolute: 0.1 10*3/uL (ref 0.0–0.1)
Basophils Relative: 0 %
Eosinophils Absolute: 0.2 10*3/uL (ref 0.0–0.5)
Eosinophils Relative: 2 %
HCT: 45.2 % (ref 36.0–46.0)
Hemoglobin: 14.7 g/dL (ref 12.0–15.0)
Immature Granulocytes: 1 %
Lymphocytes Relative: 7 %
Lymphs Abs: 0.8 10*3/uL (ref 0.7–4.0)
MCH: 31 pg (ref 26.0–34.0)
MCHC: 32.5 g/dL (ref 30.0–36.0)
MCV: 95.4 fL (ref 80.0–100.0)
Monocytes Absolute: 1 10*3/uL (ref 0.1–1.0)
Monocytes Relative: 8 %
Neutro Abs: 9.3 10*3/uL — ABNORMAL HIGH (ref 1.7–7.7)
Neutrophils Relative %: 82 %
Platelets: 307 10*3/uL (ref 150–400)
RBC: 4.74 MIL/uL (ref 3.87–5.11)
RDW: 14.6 % (ref 11.5–15.5)
WBC: 11.5 10*3/uL — ABNORMAL HIGH (ref 4.0–10.5)
nRBC: 0 % (ref 0.0–0.2)

## 2019-12-22 MED ORDER — PROCHLORPERAZINE EDISYLATE 10 MG/2ML IJ SOLN
5.0000 mg | Freq: Once | INTRAMUSCULAR | Status: AC
  Administered 2019-12-22: 5 mg via INTRAVENOUS
  Filled 2019-12-22: qty 2

## 2019-12-22 MED ORDER — CYCLOBENZAPRINE HCL 5 MG PO TABS
5.0000 mg | ORAL_TABLET | Freq: Three times a day (TID) | ORAL | Status: DC | PRN
  Administered 2019-12-23 – 2019-12-30 (×3): 5 mg via ORAL
  Filled 2019-12-22 (×4): qty 1

## 2019-12-22 MED ORDER — MORPHINE SULFATE (PF) 2 MG/ML IV SOLN
1.0000 mg | INTRAVENOUS | Status: DC | PRN
  Administered 2019-12-22 – 2019-12-23 (×2): 1 mg via INTRAVENOUS
  Filled 2019-12-22 (×2): qty 1

## 2019-12-22 MED ORDER — TRAMADOL HCL 50 MG PO TABS
100.0000 mg | ORAL_TABLET | Freq: Four times a day (QID) | ORAL | Status: DC
  Administered 2019-12-22 – 2019-12-31 (×32): 100 mg via ORAL
  Filled 2019-12-22 (×35): qty 2

## 2019-12-22 NOTE — Evaluation (Signed)
Physical Therapy Evaluation Patient Details Name: [name] [name] MRN: [id number] DOB: 1936-01-17 Today's Date: 12/22/2019   History of Present Illness  84 y.o. female admitted with R pubic ramus fx + R ischium/acetabulum fx. Hx of TIA, hypertension, hyperlipidemia, GERD, CKD 3, CAD, anxiety, chronic pain, vertigo, kyphoplasty  Clinical Impression  On eval, pt required Mod assist +2 for mobility. She was able to take a few ambulatory steps with a RW. Mobility is significantly limited by pain. Pt sweating and dry-heaving during session-likely response to severe pain. Discussed d/c plan-pt prefers to return home. Encouraged her to speak with her husband and TOC team. Will plan to follow and progress activity as tolerated.     Follow Up Recommendations SNF (HHPT and 24 hour supervision/assist if pt declines placement)    Equipment Recommendations  Rolling walker with 5" wheels;3in1 (PT);Wheelchair (measurements PT) (may need PTAR transport if pt chooses to go home)    Recommendations for Other Services       Precautions / Restrictions Precautions Precautions: Fall Restrictions Weight Bearing Restrictions: No Other Position/Activity Restrictions: WBAT      Mobility  Bed Mobility Overal bed mobility: Needs Assistance Bed Mobility: Supine to Sit;Sit to Supine Rolling: Mod assist Sidelying to sit: Mod assist;HOB elevated Supine to sit: Mod assist;+2 for physical assistance;+2 for safety/equipment;HOB elevated Sit to supine: Mod assist;+2 for physical assistance;+2 for safety/equipment;HOB elevated   General bed mobility comments: Assist for trunk and bil LEs. Utilized bedpad for scooting, positioning. Increased time.  Transfers Overall transfer level: Needs assistance Equipment used: Rolling walker (2 wheeled) Transfers: Sit to/from Stand Sit to Stand: Min assist;+2 physical assistance;+2 safety/equipment;From elevated surface Stand pivot transfers: Min assist;+2 safety/equipment        General transfer comment: Assist to power up, stabilize, control descent. VCs safety, technique, hand placement.  Ambulation/Gait Ambulation/Gait assistance: Min assist;+2 physical assistance;+2 safety/equipment Gait Distance (Feet): 3 Feet Assistive device: Rolling walker (2 wheeled) Gait Pattern/deviations: Step-to pattern;Antalgic;Trunk flexed     General Gait Details: Task was very painful and effortful for pt. VCs safety, technique, proper use of RW. Assist to stabilize and manage RW.  Stairs            Wheelchair Mobility    Modified Rankin (Stroke Patients Only)       Balance Overall balance assessment: Needs assistance;History of Falls Sitting-balance support: Feet supported;No upper extremity supported Sitting balance-Leahy Scale: Fair Sitting balance - Comments: Patient's hands on bed to assist with guarding and limit weight through pelvis.   Standing balance support: Bilateral upper extremity supported Standing balance-Leahy Scale: Poor                               Pertinent Vitals/Pain Pain Assessment: 0-10 Pain Score: 10-Worst pain ever Pain Location: R pelvis Pain Descriptors / Indicators: Grimacing;Sharp;Guarding;Moaning Pain Intervention(s): Limited activity within patient's tolerance;Monitored during session;Repositioned    Home Living Family/patient expects to be discharged to:: Unsure Living Arrangements: Spouse/significant other Available Help at Discharge: Family;Available 24 hours/day Type of Home: House Home Access: Stairs to enter Entrance Stairs-Rails: None Entrance Stairs-Number of Steps: 2-back; 4-front Home Layout: One level Home Equipment: Environmental consultant - 2 wheels;Walker - 4 wheels;Shower seat;Bedside commode;Cane - single point      Prior Function Level of Independence: Independent               Hand Dominance        Extremity/Trunk Assessment   Upper Extremity  Assessment Upper Extremity Assessment:  Defer to OT evaluation    Lower Extremity Assessment Lower Extremity Assessment: Generalized weakness;RLE deficits/detail RLE: Unable to fully assess due to pain    Cervical / Trunk Assessment Cervical / Trunk Assessment: Normal  Communication   Communication: 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  Cognition Arousal/Alertness: Awake/alert Behavior During Therapy: WFL for tasks assessed/performed Overall Cognitive Status: Within Functional Limits for tasks assessed                                        General Comments      Exercises     Assessment/Plan    PT Assessment Patient needs continued PT services  PT Problem List Decreased strength;Decreased mobility;Decreased activity tolerance;Decreased balance;Decreased range of motion;Decreased knowledge of use of DME;Pain       PT Treatment Interventions DME instruction;Gait training;Therapeutic activities;Therapeutic exercise;Patient/family education;Balance training;Functional mobility training;Stair training    PT Goals (Current goals can be found in the Care Plan section)  Acute Rehab PT Goals Patient Stated Goal: To go home - doesn't want to go to rehab PT Goal Formulation: With patient Time For Goal Achievement: 01/05/20 Potential to Achieve Goals: Good    Frequency Min 3X/week   Barriers to discharge        Co-evaluation               AM-PAC PT "6 Clicks" Mobility  Outcome Measure Help needed turning from your back to your side while in a flat bed without using bedrails?: A Lot Help needed moving from lying on your back to sitting on the side of a flat bed without using bedrails?: A Lot Help needed moving to and from a bed to a chair (including a wheelchair)?: A Lot Help needed standing up from a chair using your arms (e.g., wheelchair or bedside chair)?: A Lot Help needed to walk in hospital room?: A Lot Help needed climbing 3-5 steps with a railing? : Total 6 Click Score: 11    End of Session Equipment  Utilized During Treatment: Gait belt Activity Tolerance: Patient limited by pain;Patient limited by fatigue Patient left: in bed;with call bell/phone within reach;with bed alarm set   PT Visit Diagnosis: Muscle weakness (generalized) (M62.81);Pain;Difficulty in walking, not elsewhere classified (R26.2);History of falling (Z91.81) Pain - Right/Left: Right Pain - part of body: Hip    Time: 1610-9604 PT Time Calculation (min) (ACUTE ONLY): 19 min   Charges:   PT Evaluation $PT Eval Low Complexity: Heil, PT Acute Rehabilitation  Office: [phone] Pager: [phone]

## 2019-12-22 NOTE — Evaluation (Signed)
Occupational Therapy Evaluation Patient Details Name: BRIANNI MANTHE MRN: [id number] DOB: [birth year] Today's Date: 12/22/2019    History of Present Illness [name] [name]  is a 84 y.o. female, TIA, hypertension, hyperlipidemia, GERD, CKD 3, CAD, anxiety, and more presents with right rami fracture after fall at home.   Clinical Impression   [name] [name] is an 84 year old woman admitted to hospital s/p fall at home with right pubic rami fracture who presents with significant pain, decreased activity tolerance, and decreased ROM and strength of lower extremities resulting in significant impairment in ability to perform functional mobility and ADLs. Patient mod assist for bed mobility, min assist for standing and transfer with RW to recliner. Patient requiring significant assistance for toileting and LB ADLs. Patient limited by pain and episode of dry heaving after transfer. Patient will benefit from skilled OT services to improve deficits and learn compensatory strategies and use of DME in order to return to PLOF. Patient hopeful pain will improve so she can go home at discharge with therapy. If she does not improve her mobility and activity tolerance will need to go to short term rehab at discharge.    Follow Up Recommendations  Home health OT;SNF    Equipment Recommendations  Tub/shower seat    Recommendations for Other Services       Precautions / Restrictions Precautions Precautions: Fall Restrictions Weight Bearing Restrictions: No      Mobility Bed Mobility Overal bed mobility: Needs Assistance Bed Mobility: Rolling;Sidelying to Sit Rolling: Mod assist Sidelying to sit: Mod assist;HOB elevated          Transfers Overall transfer level: Needs assistance Equipment used: Rolling walker (2 wheeled) Transfers: Sit to/from [facility] Sit to Stand: From elevated surface;Min assist Stand pivot transfers: Min assist;+2 safety/equipment       General  transfer comment: Min assist with RW from elevated bed. Verbal cues for technque and safety, hand placement. Able to take a couple of steps to recliner using RW- with limited weight through RLE and increased time - verbal cues, tactile cues needed.    Balance Overall balance assessment: Needs assistance Sitting-balance support: Feet supported;No upper extremity supported Sitting balance-Leahy Scale: Fair Sitting balance - Comments: Patient's hands on bed to assist with guarding and limit weight through pelvis.   Standing balance support: Bilateral upper extremity supported;During functional activity Standing balance-Leahy Scale: Poor                             ADL either performed or assessed with clinical judgement   ADL Overall ADL's : Needs assistance/impaired Eating/Feeding: Independent;Sitting   Grooming: Wash/dry face;Wash/dry hands;Sitting   Upper Body Bathing: Sitting;Set up   Lower Body Bathing: Maximal assistance;Sit to/from stand;+2 for physical assistance;+2 for safety/equipment   Upper Body Dressing : Set up;Sitting   Lower Body Dressing: Total assistance;Bed level;+2 for physical assistance;+2 for safety/equipment Lower Body Dressing Details (indicate cue type and reason): Total  care for bathing after incontinence in bed Toilet Transfer: Stand-pivot;BSC;+2 for safety/equipment;Minimal assistance;RW Toilet Transfer Details (indicate cue type and reason): as demonstrated via tx to recliner Toileting- Clothing Manipulation and Hygiene: With adaptive equipment;Sit to/from stand Toileting - Clothing Manipulation Details (indicate cue type and reason): Total care for cleaning after incontinence in bed     Functional mobility during ADLs: Rolling walker;Cueing for safety;Minimal assistance;+2 for safety/equipment General ADL Comments: +2 for safety in room - but no physical assistance from second  person.     Vision   Vision Assessment?: No apparent visual  deficits     Perception     Praxis      Pertinent Vitals/Pain Pain Assessment: 0-10 Pain Score: 8  Pain Location: R butt     Hand Dominance     Extremity/Trunk Assessment Upper Extremity Assessment Upper Extremity Assessment: Overall WFL for tasks assessed   Lower Extremity Assessment Lower Extremity Assessment: Defer to PT evaluation   Cervical / Trunk Assessment Cervical / Trunk Assessment: Normal   Communication     Cognition Arousal/Alertness: Awake/alert Behavior During Therapy: WFL for tasks assessed/performed Overall Cognitive Status: Within Functional Limits for tasks assessed                                     General Comments       Exercises     Shoulder Instructions      Home Living                                          Prior Functioning/Environment                   OT Problem List: Decreased strength;Decreased range of motion;Decreased activity tolerance;Impaired balance (sitting and/or standing);Decreased knowledge of use of DME or AE;Pain      OT Treatment/Interventions: Self-care/ADL training;Therapeutic exercise;DME and/or AE instruction;Patient/family education;Balance training;Therapeutic activities    OT Goals(Current goals can be found in the care plan section) Acute Rehab OT Goals Patient Stated Goal: To go home - doesn't want to go to rehab OT Goal Formulation: With patient Time For Goal Achievement: 01/05/20 Potential to Achieve Goals: Good  OT Frequency: Min 2X/week   Barriers to D/C:            Co-evaluation              AM-PAC OT "6 Clicks" Daily Activity     Outcome Measure Help from another person eating meals?: None Help from another person taking care of personal grooming?: A Little Help from another person toileting, which includes using toliet, bedpan, or urinal?: Total Help from another person bathing (including washing, rinsing, drying)?: A Lot Help from another  person to put on and taking off regular upper body clothing?: A Little Help from another person to put on and taking off regular lower body clothing?: Total 6 Click Score: 14   End of Session Equipment Utilized During Treatment: Gait belt;Rolling walker Nurse Communication: Mobility status (nausea and dry heaving)  Activity Tolerance: Patient limited by pain Patient left: in chair;with call bell/phone within reach;with chair alarm set  OT Visit Diagnosis: Unsteadiness on feet (R26.81);Other abnormalities of gait and mobility (R26.89);History of falling (Z91.81);Pain                Time: 0922-0949 OT Time Calculation (min): 27 min Charges:  OT General Charges $OT Visit: 1 Visit OT Evaluation $OT Eval Moderate Complexity: 1 Mod  Terry Bolotin, OTR/L [place]  Office [phone] Pager: [place] 12/22/2019, 2:05 PM

## 2019-12-22 NOTE — Progress Notes (Signed)
   Subjective:    Patient reports pain as moderate.    Objective: Vital signs in last 24 hours: Temp:  [97.5 F (36.4 C)-98.2 F (36.8 C)] 97.8 F (36.6 C) (08/15 0536) Pulse Rate:  [66-81] 66 (08/15 0536) Resp:  [18-21] 18 (08/15 0536) BP: (133-178)/(60-89) 178/82 (08/15 0536) SpO2:  [90 %-97 %] 95 % (08/15 0911) Weight:  [71.1 kg-73 kg] 73 kg (08/15 0536)  Intake/Output from previous day: 08/14 0701 - 08/15 0700 In: 240 [P.O.:240] Out: 200 [Urine:200] Intake/Output this shift: Total I/O In: -  Out: 1000 [Urine:1000]  Recent Labs    12/20/19 2052 12/21/19 1013 12/22/19 0532  HGB 13.6 13.6 14.7   Recent Labs    12/21/19 1013 12/22/19 0532  WBC 10.2 11.5*  RBC 4.35 4.74  HCT 42.3 45.2  PLT 366 307   Recent Labs    12/21/19 1013 12/22/19 0532  NA 139 133*  K 4.0 4.3  CL 105 97*  CO2 25 23  BUN 20 13  CREATININE 0.68 0.63  GLUCOSE 106* 143*  CALCIUM 8.0* 8.3*   No results for input(s): LABPT, INR in the last 72 hours.  Neurologically intact No results found.  Assessment/Plan:    Up with therapy, therapy at bedside working on getting her to sit up. Pain should gradually improve over several days.  Office follow up in 2 wks .   [name] [name] 12/22/2019, 9:31 AM

## 2019-12-22 NOTE — Progress Notes (Addendum)
Patient continues to to have dry heaves and nausea. Zofran give as ordered but pt stated it does not work for her. NP [place] notified and order for Compazine 5 mg x 1 ordered.

## 2019-12-22 NOTE — Progress Notes (Signed)
PROGRESS NOTE    [name] [name]  MLY:[id number] DOB: [birth year] DOA: 12/20/2019 PCP: [name], MD  Brief Narrative:  43 white female prior TIA reflux CKD 3 anxiety CAD (PTCA 2003) hiatal hernia status post fundoplication 8127 admitted earlier this year for intractable nausea vomiting DM TY 2 CKD 2-3 Chronic low back pain pain syndrome status post kyphoplasty Dr. Nicholaus Bloom On gabapentin Robaxin previously? Accidental fall at home 8/13 resulting in 10/10 pain in right hip-imaging showed pubis ramus fracture with acetabular fractures Previous also recent  fall 7/22 2021 with a trip and a fall and struck her right wrist and landed on her chest and was prescribed prednisone by nurse practitioner in the office for chest pain  Treated with Toradol Zofran in ED and admitted for pain control   Assessment & Plan:   Active Problems:   Pubic ramus fracture ([place])   1. Right sided pubic ramus fracture-nonoperative a. Meds adjusted with tramadol first choice cut back high-dose morphine IV for severe pain b. Now tells me he cannot take NSAIDs at all therefore I have discontinued them completely c. PT OT input pending May need skilled placement 2. CAD history 3. HTN a. Continue amlodipine 10 aspirin 81 metoprolol 50 twice daily nitroglycerin b. Telemetry today only PVCs on monitor do not think an arrhythmia caused her fall 4. Hiatal hernia with fundoplication a. No NSAIDs Protonix daily, Carafate 1 tablet 4 times a day 5. DM TY 2 with nephropathy 6. CKD 2-3 7. Prior spinal stenosis status post kyphoplasty in the remote past with chronic pain a. Continue gabapentin 400 3 times daily b. She seems to be on osteoporosis treatment with Prolia in addition to [place] calcitonin presumably for osteoporotic fracture-these have been held at this time 8. Leukocytosis secondary to steroid use for?  Chest pain? a. Prednisone stopped b. Place Lidoderm patch or diclofenac sore cream on the chest  wall because of fall if there is a persisting need 9. CAD a. Troponins negative-see above discussion   DVT prophylaxis: Continue heparin Code Status: Full Family Communication:  None seen at the bedside  disposition:   Status is: Inpatient  Remains inpatient appropriate because:Ongoing active pain requiring inpatient pain management and Unsafe d/c plan   Dispo: The patient is from: Home              Anticipated d/c is to: Unclear at this time needs therapy evaluation              Anticipated d/c date is: 2 days              Patient currently is not medically stable to d/c.       Consultants:   Orthopedics  Procedures: X-rays  Antimicrobials: None   Subjective:  Some reported nausea overnight well as nausea when getting up and going to the bedside chair-she thinks that she wants to go home but I have cautioned her not to prematurely make this decision given she needed more than 2+ assist getting up and therapy still needs to evaluate her   Objective: Vitals:   12/21/19 2122 12/22/19 0139 12/22/19 0536 12/22/19 0911  BP: (!) 156/83 (!) 176/89 (!) 178/82   Pulse: 79 75 66   Resp: 20 20 18    Temp: 98.1 F (36.7 C) 97.9 F (36.6 C) 97.8 F (36.6 C)   TempSrc: Oral  Oral   SpO2: 96% 95% 97% 95%  Weight:   73 kg   Height:  Intake/Output Summary (Last 24 hours) at 12/22/2019 1117 Last data filed at 12/22/2019 0759 Gross per 24 hour  Intake 240 ml  Output 1200 ml  Net -960 ml   Filed Weights   12/21/19 1007 12/22/19 0536  Weight: 71.1 kg 73 kg    Examination:  Awake coherent no distress EOMI NCAT no focal deficit Chest clear no added sound no rales no rhonchi Abdomen soft ROM diminished  Data Reviewed: I have personally reviewed following labs and imaging studies  Sodium 133 BUN/creatinine down from 20/0.6-13/0.6 White count 11 Phosphorus 2.4   Radiology Studies: DG Chest 2 View  Result Date: 12/20/2019 CLINICAL DATA:  Chest pain.  Fall.  EXAM: CHEST - 2 VIEW COMPARISON:  Chest radiograph 11/29/2019, CT 04/29/2019 FINDINGS: Mild chronic cardiomegaly. Unchanged mediastinal contours. Improved linear opacity at the right lung base. No acute airspace disease. No pneumothorax or large pleural effusion. No pulmonary edema. Degenerative change in the left shoulder. Kyphoplasty in the lower thoracic spine is partially visualized. IMPRESSION: No acute chest findings. Mild chronic cardiomegaly. Electronically Signed   By: Keith Rake M.D.   On: 12/20/2019 22:20   CT PELVIS WO CONTRAST  Result Date: 12/20/2019 CLINICAL DATA:  Fall, right hip pain EXAM: CT PELVIS WITHOUT CONTRAST TECHNIQUE: Multidetector CT imaging of the pelvis was performed following the standard protocol without intravenous contrast. COMPARISON:  Plain films today FINDINGS: Urinary Tract:  No abnormality visualized. Bowel:  Unremarkable visualized pelvic bowel loops. Vascular/Lymphatic: Aortic and iliac calcifications. No aneurysm or adenopathy. Reproductive:  Prior hysterectomy.  No adnexal masses. Other:  No free fluid or free air. Musculoskeletal: There is a minimally displaced fracture through the right inferior pubic ramus. Nondisplaced fracture also seen within the ischium at the anteromedial acetabulum. No proximal femoral fracture. No subluxation or dislocation. IMPRESSION: Fractures through the right inferior pubic ramus and right ischium/anteromedial acetabulum. No proximal femoral abnormality. Electronically Signed   By: Rolm Baptise M.D.   On: 12/20/2019 20:09   DG Hip Unilat With Pelvis 2-3 Views Right  Result Date: 12/20/2019 CLINICAL DATA:  84 year old female with fall and hip pain. EXAM: DG HIP (WITH OR WITHOUT PELVIS) 2-3V RIGHT COMPARISON:  CT of the abdomen pelvis dated 06/10/2019. FINDINGS: There is no acute fracture or dislocation. The bones are osteopenic. There is mild to moderate arthritic changes of the hips bilaterally. There is degenerative changes of  the lower lumbar spine. L4 vertebroplasty. The soft tissues are unremarkable. IMPRESSION: No acute fracture or dislocation. Electronically Signed   By: Anner Crete M.D.   On: 12/20/2019 18:34     Scheduled Meds: . amLODipine  10 mg Oral Daily  . aspirin EC  81 mg Oral QHS  . darifenacin  7.5 mg Oral Daily  . DULoxetine  30 mg Oral BID  . gabapentin  400 mg Oral QHS  . heparin  5,000 Units Subcutaneous Q8H  . ketorolac  15 mg Intravenous Q6H  . metoprolol tartrate  25 mg Oral QHS  . mometasone-formoterol  2 puff Inhalation BID  . oxybutynin  10 mg Oral QHS  . pantoprazole  40 mg Oral Daily  . rOPINIRole  2 mg Oral TID  . senna-docusate  2 tablet Oral BID  . sucralfate  1 g Oral TID AC & HS   Continuous Infusions:   LOS: 2 days    Time spent: [place], MD [facility] To contact the attending provider between 7A-7P or the covering provider during after hours 7P-7A, please log  into the web site [email or web address] and access using universal [place] password for that web site. If you do not have the password, please call the hospital operator.  12/22/2019, 11:17 AM

## 2019-12-22 NOTE — Progress Notes (Signed)
Staff made aware of pt. becoming Nauseated/Vomiting.

## 2019-12-23 LAB — CBC WITH DIFFERENTIAL/PLATELET
Abs Immature Granulocytes: 0.13 10*3/uL — ABNORMAL HIGH (ref 0.00–0.07)
Basophils Absolute: 0.1 10*3/uL (ref 0.0–0.1)
Basophils Relative: 1 %
Eosinophils Absolute: 0.1 10*3/uL (ref 0.0–0.5)
Eosinophils Relative: 1 %
HCT: 44 % (ref 36.0–46.0)
Hemoglobin: 14 g/dL (ref 12.0–15.0)
Immature Granulocytes: 1 %
Lymphocytes Relative: 19 %
Lymphs Abs: 2.1 10*3/uL (ref 0.7–4.0)
MCH: 30.8 pg (ref 26.0–34.0)
MCHC: 31.8 g/dL (ref 30.0–36.0)
MCV: 96.9 fL (ref 80.0–100.0)
Monocytes Absolute: 1 10*3/uL (ref 0.1–1.0)
Monocytes Relative: 9 %
Neutro Abs: 7.6 10*3/uL (ref 1.7–7.7)
Neutrophils Relative %: 69 %
Platelets: 293 10*3/uL (ref 150–400)
RBC: 4.54 MIL/uL (ref 3.87–5.11)
RDW: 14.4 % (ref 11.5–15.5)
WBC: 11 10*3/uL — ABNORMAL HIGH (ref 4.0–10.5)
nRBC: 0 % (ref 0.0–0.2)

## 2019-12-23 LAB — BASIC METABOLIC PANEL
Anion gap: 10 (ref 5–15)
BUN: 13 mg/dL (ref 8–23)
CO2: 24 mmol/L (ref 22–32)
Calcium: 8.5 mg/dL — ABNORMAL LOW (ref 8.9–10.3)
Chloride: 96 mmol/L — ABNORMAL LOW (ref 98–111)
Creatinine, Ser: 0.68 mg/dL (ref 0.44–1.00)
GFR calc Af Amer: >60 mL/min (ref >60)
GFR calc non Af Amer: >60 mL/min (ref >60)
Glucose, Bld: 122 mg/dL — ABNORMAL HIGH (ref 70–99)
Potassium: 4.4 mmol/L (ref 3.5–5.1)
Sodium: 130 mmol/L — ABNORMAL LOW (ref 135–145)

## 2019-12-23 MED ORDER — ACETAMINOPHEN 325 MG PO TABS
650.0000 mg | ORAL_TABLET | Freq: Four times a day (QID) | ORAL | Status: DC | PRN
  Administered 2019-12-25 – 2019-12-30 (×2): 650 mg via ORAL
  Filled 2019-12-23 (×2): qty 2

## 2019-12-23 MED ORDER — MORPHINE SULFATE (PF) 2 MG/ML IV SOLN
1.0000 mg | INTRAVENOUS | Status: DC | PRN
  Administered 2019-12-24: 1 mg via INTRAVENOUS
  Filled 2019-12-23 (×2): qty 1

## 2019-12-23 MED ORDER — PROCHLORPERAZINE MALEATE 10 MG PO TABS
5.0000 mg | ORAL_TABLET | Freq: Four times a day (QID) | ORAL | Status: DC | PRN
  Administered 2019-12-23: 5 mg via ORAL
  Filled 2019-12-23: qty 1

## 2019-12-23 MED ORDER — FLEET ENEMA 7-19 GM/118ML RE ENEM
1.0000 | ENEMA | Freq: Once | RECTAL | Status: AC
  Administered 2019-12-23: 1 via RECTAL
  Filled 2019-12-23: qty 1

## 2019-12-23 MED ORDER — SORBITOL 70 % SOLN
30.0000 mL | Freq: Every day | Status: DC
  Administered 2019-12-23: 30 mL via ORAL
  Filled 2019-12-23 (×2): qty 30

## 2019-12-23 NOTE — Progress Notes (Signed)
Physical Therapy Treatment Patient Details Name: [name] [name] MRN: [id number] DOB: 1935-06-10 Today's Date: 12/23/2019    History of Present Illness 84 y.o. female admitted with R pubic ramus fx + R ischium/acetabulum fx. Hx of TIA, hypertension, hyperlipidemia, GERD, CKD 3, CAD, anxiety, chronic pain, vertigo, kyphoplasty    PT Comments    Pt continues to require +2 assist for safe mobility. Mobility remains limited by pain. Pt requires max encouragement for participation 2* pain with activity. Continue to recommend SNF.   Follow Up Recommendations  SNF (HHPT, 24/7supervision + assist if pt declines placement)     Equipment Recommendations  Rolling walker with 5" wheels;3in1;Wheelchair     Recommendations for Other Services       Precautions / Restrictions Precautions Precautions: Fall Restrictions Weight Bearing Restrictions: No RLE Weight Bearing: Weight bearing as tolerated    Mobility  Bed Mobility Overal bed mobility: Needs Assistance Bed Mobility: Rolling;Sidelying to Sit;Sit to Sidelying Rolling: Min assist Sidelying to sit: Mod assist     Sit to sidelying: Mod assist General bed mobility comments: Assist for trunk and bil LEs. Increased time. Pt roll and sidelying<>sit on L side  Transfers Overall transfer level: Needs assistance Equipment used: Rolling walker (2 wheeled) Transfers: Sit to/from Stand Sit to Stand: Min assist;+2 safety/equipment         General transfer comment: Assist to power up, stabilize, control descent. VCs safety, technique, hand placement. Increased time.  Ambulation/Gait Ambulation/Gait assistance: Min assist;+2 safety/equipment   Assistive device: Rolling walker (2 wheeled)       General Gait Details: Side steps along side of bed with RW. Cues for safety, technique, sequencing, proper use of RW.   Stairs             Wheelchair Mobility    Modified Rankin (Stroke Patients Only)       Balance Overall  balance assessment: Needs assistance;History of Falls         Standing balance support: Bilateral upper extremity supported Standing balance-Leahy Scale: Poor                              Cognition Arousal/Alertness: Awake/alert Behavior During Therapy: WFL for tasks assessed/performed Overall Cognitive Status: Within Functional Limits for tasks assessed                                        Exercises      General Comments        Pertinent Vitals/Pain Pain Assessment: Faces Pain Score: 9  Pain Location: pelvis Pain Descriptors / Indicators: Discomfort;Grimacing;Guarding;Sharp;Moaning Pain Intervention(s): Limited activity within patient's tolerance;Monitored during session;Repositioned    Home Living                      Prior Function            PT Goals (current goals can now be found in the care plan section) Progress towards PT goals: Progressing toward goals    Frequency    Min 3X/week      PT Plan Current plan remains appropriate    Co-evaluation              AM-PAC PT "6 Clicks" Mobility   Outcome Measure  Help needed turning from your back to your side while in a flat bed without using bedrails?: A  Lot Help needed moving from lying on your back to sitting on the side of a flat bed without using bedrails?: A Lot Help needed moving to and from a bed to a chair (including a wheelchair)?: A Lot Help needed standing up from a chair using your arms (e.g., wheelchair or bedside chair)?: A Lot Help needed to walk in hospital room?: A Lot Help needed climbing 3-5 steps with a railing? : Total 6 Click Score: 11    End of Session Equipment Utilized During Treatment: Gait belt Activity Tolerance: Patient limited by pain Patient left: in bed;with call bell/phone within reach;with bed alarm set   PT Visit Diagnosis: Muscle weakness (generalized) (M62.81);Pain;Difficulty in walking, not elsewhere classified  (R26.2);History of falling (Z91.81) Pain - part of body:  (pelvis)     Time: 2010-0712 PT Time Calculation (min) (ACUTE ONLY): 23 min  Charges:  $Gait Training: 8-22 mins $Therapeutic Activity: 8-22 mins                         Doreatha Massed, PT Acute Rehabilitation  Office: [phone] Pager: [phone]

## 2019-12-23 NOTE — Care Management Important Message (Signed)
Important Message  Patient Details IM Letter given to the Patient Name: [name] [name] MRN: [id number] Date of Birth: November 26, 1935   Medicare Important Message Given:  Yes     Kerin Salen 12/23/2019, 10:59 AM

## 2019-12-23 NOTE — Progress Notes (Signed)
PROGRESS NOTE    [name] [name]  GPQ:[id number] DOB: 01-31-1936 DOA: 12/20/2019 PCP: [name], MD  Brief Narrative:  88 white female prior TIA reflux CKD 3 anxiety CAD (PTCA 2003) hiatal hernia status post fundoplication 0940 admitted earlier this year for intractable nausea vomiting DM TY 2 CKD 2-3 Chronic low back pain pain syndrome status post kyphoplasty Dr. Nicholaus Bloom On gabapentin Robaxin previously? Accidental fall at home 8/13 resulting in 10/10 pain in right hip-imaging showed pubis ramus fracture with acetabular fractures Previous also recent  fall 7/22 2021 with a trip and a fall and struck her right wrist and landed on her chest and was prescribed prednisone by nurse practitioner in the office for chest pain  Treated with Toradol Zofran in ED and admitted for pain control   Assessment & Plan:   Active Problems:   Pubic ramus fracture ([place])   1. Right sided pubic ramus fracture-nonoperative a. Meds adjusted with tramadol first choice cut back high-dose morphine IV for severe pain as intolerant to n/v from this b. cannot take NSAIDs at all therefore I have discontinued them completely c. She understands she may need skilled placement but wishes to reconsider d. I will check with her husband to see what kind of support she has at home-I called him several times without response 2. CAD history 3. HTN a. Continue amlodipine 10 aspirin 81 metoprolol 50 twice daily nitroglycerin b. Telemetry tod discontinue telemetry monitoring 8/15 4. Severe constipation a. Give sorbitol and add enema b. Monitor for effect 5. Hiatal hernia with fundoplication a. No NSAIDs Protonix daily, Carafate 1 tablet 4 times a day 6. Impaired glucose tolerance a. Well-controlled blood sugars not on home meds only on gabapentin which we will continue b. Needs outpatient A1c on follow-up 7. CKD 2-3 8. Prior spinal stenosis status post kyphoplasty in the remote past with chronic  pain a. Continue gabapentin 400 3 times daily b. As an outpatient continue Prolia in addition to [facility] calcitonin presumably for osteoporotic fracture 9. Leukocytosis secondary to steroid use for?  Chest pain? a. Prednisone stopped b. Place Lidoderm patch or diclofenac sore cream on the chest wall because of fall if there is a persisting need 10. CAD a. Troponins negative-see above discussion   DVT prophylaxis: Continue heparin Code Status: Full Family Communication:  Called husband Delfino Lovett at [phone] to discuss plan of care He was updated fully 8/16  disposition:   Status is: Inpatient  Remains inpatient appropriate because:Ongoing active pain requiring inpatient pain management and Unsafe d/c plan   Dispo: The patient is from: Home              Anticipated d/c is to: Unclear at this time needs therapy evaluation              Anticipated d/c date is: 2 days              Patient currently is not medically stable to d/c.       Consultants:   Orthopedics  Procedures: X-rays  Antimicrobials: None   Subjective:  No nausea, drinking more than eating Still has moderate pain Undecided about rehab-I had a long talk with her about this Tells me has not had a stool since last week!   Objective: Vitals:   12/23/19 0359 12/23/19 0535 12/23/19 0757 12/23/19 1023  BP:  (!) 186/91  (!) 159/85  Pulse:  70    Resp:  18    Temp:  97.7 F (36.5 C)  TempSrc:  Oral    SpO2:  94% 96%   Weight: 73.2 kg     Height:        Intake/Output Summary (Last 24 hours) at 12/23/2019 1053 Last data filed at 12/23/2019 0757 Gross per 24 hour  Intake --  Output 850 ml  Net -850 ml   Filed Weights   12/21/19 1007 12/22/19 0536 12/23/19 0359  Weight: 71.1 kg 73 kg 73.2 kg    Examination:  Coherent pleasant no distress Some pain EOMI NCAT no focal deficit S1-S2 no murmur Chest clear Abdomen soft Straight leg raise on right side is limited secondary to pain  Data Reviewed:  I have personally reviewed following labs and imaging studies  Sodium down to 130 BUNs/creatinine 13/0.6 WBC 11 Hemoglobin 14   Radiology Studies: No results found.   Scheduled Meds: . amLODipine  10 mg Oral Daily  . aspirin EC  81 mg Oral QHS  . darifenacin  7.5 mg Oral Daily  . DULoxetine  30 mg Oral BID  . gabapentin  400 mg Oral QHS  . heparin  5,000 Units Subcutaneous Q8H  . metoprolol tartrate  25 mg Oral QHS  . mometasone-formoterol  2 puff Inhalation BID  . oxybutynin  10 mg Oral QHS  . pantoprazole  40 mg Oral Daily  . rOPINIRole  2 mg Oral TID  . senna-docusate  2 tablet Oral BID  . sucralfate  1 g Oral TID AC & HS  . traMADol  100 mg Oral Q6H   Continuous Infusions:   LOS: 3 days    Time spent: 40  Nita Sells, MD [facility] To contact the attending provider between 7A-7P or the covering provider during after hours 7P-7A, please log into the web site [email or web address] and access using universal [place] password for that web site. If you do not have the password, please call the hospital operator.  12/23/2019, 10:53 AM

## 2019-12-24 ENCOUNTER — Inpatient Hospital Stay (HOSPITAL_COMMUNITY): Payer: PPO

## 2019-12-24 LAB — COMPREHENSIVE METABOLIC PANEL
ALT: 199 U/L — ABNORMAL HIGH (ref 0–44)
AST: 112 U/L — ABNORMAL HIGH (ref 15–41)
Albumin: 3.5 g/dL (ref 3.5–5.0)
Alkaline Phosphatase: 239 U/L — ABNORMAL HIGH (ref 38–126)
Anion gap: 10 (ref 5–15)
BUN: 12 mg/dL (ref 8–23)
CO2: 23 mmol/L (ref 22–32)
Calcium: 8.2 mg/dL — ABNORMAL LOW (ref 8.9–10.3)
Chloride: 95 mmol/L — ABNORMAL LOW (ref 98–111)
Creatinine, Ser: 0.59 mg/dL (ref 0.44–1.00)
GFR calc Af Amer: >60 mL/min (ref >60)
GFR calc non Af Amer: >60 mL/min (ref >60)
Glucose, Bld: 106 mg/dL — ABNORMAL HIGH (ref 70–99)
Potassium: 4.1 mmol/L (ref 3.5–5.1)
Sodium: 128 mmol/L — ABNORMAL LOW (ref 135–145)
Total Bilirubin: 1.9 mg/dL — ABNORMAL HIGH (ref 0.3–1.2)
Total Protein: 6.3 g/dL — ABNORMAL LOW (ref 6.5–8.1)

## 2019-12-24 LAB — CBC WITH DIFFERENTIAL/PLATELET
Abs Immature Granulocytes: 0.17 10*3/uL — ABNORMAL HIGH (ref 0.00–0.07)
Basophils Absolute: 0.1 10*3/uL (ref 0.0–0.1)
Basophils Relative: 1 %
Eosinophils Absolute: 0.1 10*3/uL (ref 0.0–0.5)
Eosinophils Relative: 1 %
HCT: 43.8 % (ref 36.0–46.0)
Hemoglobin: 14.3 g/dL (ref 12.0–15.0)
Immature Granulocytes: 2 %
Lymphocytes Relative: 15 %
Lymphs Abs: 1.6 10*3/uL (ref 0.7–4.0)
MCH: 31.4 pg (ref 26.0–34.0)
MCHC: 32.6 g/dL (ref 30.0–36.0)
MCV: 96.3 fL (ref 80.0–100.0)
Monocytes Absolute: 1.5 10*3/uL — ABNORMAL HIGH (ref 0.1–1.0)
Monocytes Relative: 15 %
Neutro Abs: 6.9 10*3/uL (ref 1.7–7.7)
Neutrophils Relative %: 66 %
Platelets: 274 10*3/uL (ref 150–400)
RBC: 4.55 MIL/uL (ref 3.87–5.11)
RDW: 14.1 % (ref 11.5–15.5)
WBC: 10.3 10*3/uL (ref 4.0–10.5)
nRBC: 0 % (ref 0.0–0.2)

## 2019-12-24 LAB — OSMOLALITY, URINE: Osmolality, Ur: 275 mOsm/kg — ABNORMAL LOW (ref 300–900)

## 2019-12-24 LAB — SODIUM, URINE, RANDOM: Sodium, Ur: <10 mmol/L

## 2019-12-24 MED ORDER — PROMETHAZINE HCL 25 MG/ML IJ SOLN
12.5000 mg | Freq: Three times a day (TID) | INTRAMUSCULAR | Status: DC
  Administered 2019-12-24 – 2019-12-31 (×21): 12.5 mg via INTRAVENOUS
  Filled 2019-12-24 (×22): qty 1

## 2019-12-24 MED ORDER — GABAPENTIN 300 MG PO CAPS
300.0000 mg | ORAL_CAPSULE | Freq: Every day | ORAL | Status: AC
  Administered 2019-12-24: 300 mg via ORAL
  Filled 2019-12-24: qty 1

## 2019-12-24 MED ORDER — METHOCARBAMOL 500 MG PO TABS
750.0000 mg | ORAL_TABLET | Freq: Three times a day (TID) | ORAL | Status: DC
  Filled 2019-12-24 (×2): qty 2

## 2019-12-24 MED ORDER — LIP MEDEX EX OINT
TOPICAL_OINTMENT | CUTANEOUS | Status: DC | PRN
  Filled 2019-12-24: qty 7

## 2019-12-24 MED ORDER — MENTHOL 3 MG MT LOZG
1.0000 | LOZENGE | OROMUCOSAL | Status: DC | PRN
  Administered 2019-12-24: 3 mg via ORAL
  Filled 2019-12-24 (×2): qty 9

## 2019-12-24 NOTE — Progress Notes (Signed)
PROGRESS NOTE    [name] [name]  CHE:[id number] DOB: 1935-07-19 DOA: 12/20/2019 PCP: [name], MD  Brief Narrative:  29 white female prior TIA reflux CKD 3 anxiety CAD (PTCA 2003) hiatal hernia status post fundoplication [id number] admitted earlier this year for intractable nausea vomiting DM TY 2 CKD 2-3 Chronic low back pain pain syndrome status post kyphoplasty Dr. Nicholaus Bloom On gabapentin Robaxin previously? Accidental fall at home 8/13 resulting in 10/10 pain in right hip-imaging showed pubis ramus fracture with acetabular fractures Previous also recent  fall 7/22 2021 with a trip and a fall and struck her right wrist and landed on her chest and was prescribed prednisone by nurse practitioner in the office for chest pain  Treated with Toradol Zofran in ED and admitted for pain control   Assessment & Plan:   Active Problems:   Pubic ramus fracture ([place])   1. Right sided pubic ramus fracture-nonoperative a. Meds adjusted with tramadol first choice cut back high-dose morphine IV for severe pain as intolerant to n/v from this b. cannot take NSAIDs at all therefore I have discontinued them completely c. Her pain is rated at probably 7-8 on 10 so she still qualifies and needs IV morphine  d. Patient declines to use oxycodone and is unable to take ibuprofen because of her prior fundoplication-I will add Robaxin today to see if this makes a difference e. Pain is uncontrolled she is barely moving so not ready for discharge and she is willing to reconsider going to skilled facility (I think she was worried about seeing if this was approved by insurance or not) f. Await patient decision after extensive 10-minute conversation with husband about risks and benefits of home versus SNF placement and TOC further input  2. Mild ileus a. Probably from pain meds and immobility b. Laxative seems to have helped-if continues to have nausea vomiting may need to keep n.p.o. and allow this to  resolve 3. CAD history 4. HTN a. Continue amlodipine 10 aspirin 81 metoprolol 50 twice daily nitroglycerin b. discontinue telemetry monitoring 8/15 5. Tea/toast potomania a. Likely from drinking too muhc b. 1500 cc restriciton c.  get urine osmolality/sodium and recheck labs a.m. 6. Severe constipation a. Stop sorbitol 8/17 given good effect with laxatives b. Continue MiraLAX and senna at this time 7. Hiatal hernia with fundoplication a. No NSAIDs Protonix daily, Carafate 1 tablet 4 times a day 8. Impaired glucose tolerance a. Well-controlled blood sugars not on home meds only on gabapentin which we will continue b. Needs outpatient A1c on follow-up 9. CKD 2-3 10. Prior spinal stenosis status post kyphoplasty in the remote past with chronic pain a. Continue gabapentin 400 3 times daily b. As an outpatient continue Prolia in addition to [facility] calcitonin presumably for osteoporotic fracture 11. Leukocytosis secondary to steroid use for?  Chest pain? a. Prednisone stopped b. Place Lidoderm patch or diclofenac sore cream on the chest wall because of fall if there is a persisting need 12. CAD a. Troponins negative-see above discussion   DVT prophylaxis: Continue heparin Code Status: Full Family Communication:  Called husband Delfino Lovett at [phone] to discuss plan of care He was updated fully 8/16 Mentioned to him we do need a decision in the next several days about placement as I think she is very unsafe to go home and will have another fall and severe pain and he lives alone at home with her There is a daughter that does help I have given him the number of  our transition Warden/ranger to see if he and his wife change their minds with regards to skilled care in the next several days   disposition:   Status is: Inpatient  Remains inpatient appropriate because:Ongoing active pain requiring inpatient pain management and Unsafe d/c plan   Dispo: The patient is from: Home               Anticipated d/c is to: Unclear at this time needs therapy evaluation              Anticipated d/c date is: 2 days              Patient currently is not medically stable to d/c.    Consultants:   Orthopedics  Procedures: X-rays  Antimicrobials: None   Subjective:  Moderate to severe pain with some nausea Needed IV morphine earlier today Taking tramadol but still severe pain and inability to move much Undecided about skilled facility as per above discussion she also seems to be drinking quite a lot of fluid   Objective: Vitals:   12/24/19 0500 12/24/19 0858 12/24/19 1025 12/24/19 1400  BP:   138/71 129/69  Pulse:    80  Resp:    16  Temp:    98.1 F (36.7 C)  TempSrc:      SpO2:  95%  92%  Weight: 72.8 kg     Height:        Intake/Output Summary (Last 24 hours) at 12/24/2019 1515 Last data filed at 12/24/2019 1055 Gross per 24 hour  Intake 480 ml  Output --  Net 480 ml   Filed Weights   12/22/19 0536 12/23/19 0359 12/24/19 0500  Weight: 73 kg 73.2 kg 72.8 kg    Examination:  Coherent pleasant no distress Some pain EOMI NCAT no focal deficit S1-S2 no murmur Chest clear Abdomen soft Straight leg raise on right side is limited secondary to pain  Data Reviewed: I have personally reviewed following labs and imaging studies  Sodium down to 128 BUNs/creatinine 13/0.6-nephrograms and 12/0.5 WBC 11->10.3 Hemoglobin 14   Radiology Studies: DG Abd 2 Views  Result Date: 12/24/2019 CLINICAL DATA:  Nausea and vomiting EXAM: ABDOMEN - 2 VIEW COMPARISON:  04/12/2018 FINDINGS: Mildly distended loops of large and small bowel. No free air identified. Small air-fluid levels noted. Multilevel vertebroplasty in the thoracic and lumbar spine. Pedicle screw fusion L1 through L3. IMPRESSION: Mild ileus Electronically Signed   By: Franchot Gallo M.D.   On: 12/24/2019 11:33     Scheduled Meds: . amLODipine  10 mg Oral Daily  . aspirin EC  81 mg Oral QHS  . darifenacin   7.5 mg Oral Daily  . DULoxetine  30 mg Oral BID  . gabapentin  400 mg Oral QHS  . heparin  5,000 Units Subcutaneous Q8H  . metoprolol tartrate  25 mg Oral QHS  . mometasone-formoterol  2 puff Inhalation BID  . oxybutynin  10 mg Oral QHS  . pantoprazole  40 mg Oral Daily  . promethazine  12.5 mg Intravenous Q8H  . rOPINIRole  2 mg Oral TID  . senna-docusate  2 tablet Oral BID  . sucralfate  1 g Oral TID AC & HS  . traMADol  100 mg Oral Q6H   Continuous Infusions:   LOS: 4 days    Time spent: 40  Nita Sells, MD [facility] To contact the attending provider between 7A-7P or the covering provider during after hours 7P-7A, please log into  the web site [email or web address] and access using universal [place] password for that web site. If you do not have the password, please call the hospital operator.  12/24/2019, 3:15 PM

## 2019-12-24 NOTE — Plan of Care (Signed)
  Problem: Education: Goal: Knowledge of General Education information will improve Description: Including pain rating scale, medication(s)/side effects and non-pharmacologic comfort measures Outcome: Progressing   Problem: Health Behavior/Discharge Planning: Goal: Ability to manage health-related needs will improve Outcome: Progressing   Problem: Clinical Measurements: Goal: Ability to maintain clinical measurements within normal limits will improve Outcome: Progressing   Problem: Activity: Goal: Risk for activity intolerance will decrease Outcome: Progressing   Problem: Nutrition: Goal: Adequate nutrition will be maintained Outcome: Progressing   Problem: Coping: Goal: Level of anxiety will decrease Outcome: Progressing   Problem: Elimination: Goal: Will not experience complications related to bowel motility Outcome: Progressing Goal: Will not experience complications related to urinary retention Outcome: Progressing   Problem: Pain Managment: Goal: General experience of comfort will improve Outcome: Progressing   Problem: Safety: Goal: Ability to remain free from injury will improve Outcome: Progressing   Problem: Skin Integrity: Goal: Risk for impaired skin integrity will decrease Outcome: Progressing   

## 2019-12-25 DIAGNOSIS — K449 Diaphragmatic hernia without obstruction or gangrene: Secondary | ICD-10-CM

## 2019-12-25 DIAGNOSIS — K567 Ileus, unspecified: Secondary | ICD-10-CM

## 2019-12-25 DIAGNOSIS — I10 Essential (primary) hypertension: Secondary | ICD-10-CM

## 2019-12-25 DIAGNOSIS — K5900 Constipation, unspecified: Secondary | ICD-10-CM

## 2019-12-25 DIAGNOSIS — I2510 Atherosclerotic heart disease of native coronary artery without angina pectoris: Secondary | ICD-10-CM

## 2019-12-25 DIAGNOSIS — E871 Hypo-osmolality and hyponatremia: Secondary | ICD-10-CM

## 2019-12-25 DIAGNOSIS — S3282XA Multiple fractures of pelvis without disruption of pelvic ring, initial encounter for closed fracture: Secondary | ICD-10-CM

## 2019-12-25 DIAGNOSIS — Z9889 Other specified postprocedural states: Secondary | ICD-10-CM

## 2019-12-25 DIAGNOSIS — D72829 Elevated white blood cell count, unspecified: Secondary | ICD-10-CM

## 2019-12-25 DIAGNOSIS — R7302 Impaired glucose tolerance (oral): Secondary | ICD-10-CM

## 2019-12-25 LAB — CBC WITH DIFFERENTIAL/PLATELET
Abs Immature Granulocytes: 0.2 10*3/uL — ABNORMAL HIGH (ref 0.00–0.07)
Basophils Absolute: 0.1 10*3/uL (ref 0.0–0.1)
Basophils Relative: 1 %
Eosinophils Absolute: 0.2 10*3/uL (ref 0.0–0.5)
Eosinophils Relative: 2 %
HCT: 46 % (ref 36.0–46.0)
Hemoglobin: 14.7 g/dL (ref 12.0–15.0)
Immature Granulocytes: 2 %
Lymphocytes Relative: 19 %
Lymphs Abs: 2 10*3/uL (ref 0.7–4.0)
MCH: 31 pg (ref 26.0–34.0)
MCHC: 32 g/dL (ref 30.0–36.0)
MCV: 97 fL (ref 80.0–100.0)
Monocytes Absolute: 1.5 10*3/uL — ABNORMAL HIGH (ref 0.1–1.0)
Monocytes Relative: 15 %
Neutro Abs: 6.4 10*3/uL (ref 1.7–7.7)
Neutrophils Relative %: 61 %
Platelets: 267 10*3/uL (ref 150–400)
RBC: 4.74 MIL/uL (ref 3.87–5.11)
RDW: 14.5 % (ref 11.5–15.5)
WBC: 10.3 10*3/uL (ref 4.0–10.5)
nRBC: 0 % (ref 0.0–0.2)

## 2019-12-25 LAB — COMPREHENSIVE METABOLIC PANEL
ALT: 145 U/L — ABNORMAL HIGH (ref 0–44)
AST: 33 U/L (ref 15–41)
Albumin: 3.5 g/dL (ref 3.5–5.0)
Alkaline Phosphatase: 224 U/L — ABNORMAL HIGH (ref 38–126)
Anion gap: 10 (ref 5–15)
BUN: 15 mg/dL (ref 8–23)
CO2: 23 mmol/L (ref 22–32)
Calcium: 8.5 mg/dL — ABNORMAL LOW (ref 8.9–10.3)
Chloride: 97 mmol/L — ABNORMAL LOW (ref 98–111)
Creatinine, Ser: 0.68 mg/dL (ref 0.44–1.00)
GFR calc Af Amer: >60 mL/min (ref >60)
GFR calc non Af Amer: >60 mL/min (ref >60)
Glucose, Bld: 98 mg/dL (ref 70–99)
Potassium: 4.2 mmol/L (ref 3.5–5.1)
Sodium: 130 mmol/L — ABNORMAL LOW (ref 135–145)
Total Bilirubin: 1.4 mg/dL — ABNORMAL HIGH (ref 0.3–1.2)
Total Protein: 6.6 g/dL (ref 6.5–8.1)

## 2019-12-25 MED ORDER — VITAMIN D 25 MCG (1000 UNIT) PO TABS
5000.0000 [IU] | ORAL_TABLET | Freq: Two times a day (BID) | ORAL | Status: DC
  Administered 2019-12-25 – 2019-12-31 (×13): 5000 [IU] via ORAL
  Filled 2019-12-25 (×13): qty 5

## 2019-12-25 MED ORDER — CALCITONIN (SALMON) 200 UNIT/ACT NA SOLN
1.0000 | Freq: Every day | NASAL | Status: DC
  Administered 2019-12-27 – 2019-12-31 (×5): 1 via NASAL
  Filled 2019-12-25: qty 3.7

## 2019-12-25 MED ORDER — GABAPENTIN 300 MG PO CAPS
300.0000 mg | ORAL_CAPSULE | Freq: Four times a day (QID) | ORAL | Status: DC
  Administered 2019-12-25 – 2019-12-31 (×22): 300 mg via ORAL
  Filled 2019-12-25 (×22): qty 1

## 2019-12-25 MED ORDER — VITAMIN B-12 1000 MCG PO TABS
1000.0000 ug | ORAL_TABLET | Freq: Every day | ORAL | Status: DC
  Administered 2019-12-26 – 2019-12-31 (×6): 1000 ug via ORAL
  Filled 2019-12-25 (×6): qty 1

## 2019-12-25 MED ORDER — ACETAMINOPHEN 500 MG PO TABS
500.0000 mg | ORAL_TABLET | Freq: Three times a day (TID) | ORAL | Status: DC
  Administered 2019-12-25 – 2019-12-31 (×18): 500 mg via ORAL
  Filled 2019-12-25 (×18): qty 1

## 2019-12-25 MED ORDER — GABAPENTIN 300 MG PO CAPS: 300.0000 mg | ORAL_CAPSULE | Freq: Every day | ORAL | Status: DC

## 2019-12-25 NOTE — Progress Notes (Signed)
Occupational Therapy Treatment Patient Details Name: [name] [name] MRN: [id number] DOB: 12-Mar-1936 Today's Date: 12/25/2019    History of present illness 84 y.o. female admitted with R pubic ramus fx + R ischium/acetabulum fx. Hx of TIA, hypertension, hyperlipidemia, GERD, CKD 3, CAD, anxiety, chronic pain, vertigo, kyphoplasty   OT comments  Treatment focused on functional mobility as patient continues to have deficits with movement due to pain. Patient mod assist for side lying to sit and min assist for standing and stand pivot to recliner. Ambulation continues to be limited secondary to pain. With standing patient became unsteady and physically shaking the walker due to anxiety and fear. With verbal and tactile cues patient able to make the pivot safely. Patient's pain continues to limit her activity and patient requiring total care for toileting and lower body dressing. Patient has not made progress towards goals and thus will need short term rehab at discharge and not be able to go home as she wanted.   Follow Up Recommendations  SNF    Equipment Recommendations  Tub/shower seat    Recommendations for Other Services      Precautions / Restrictions Precautions Precautions: Fall Restrictions Weight Bearing Restrictions: Yes RLE Weight Bearing: Weight bearing as tolerated       Mobility Bed Mobility Overal bed mobility: Needs Assistance Bed Mobility: Rolling;Sidelying to Sit;Sit to Sidelying Rolling: Min assist Sidelying to sit: Mod assist       General bed mobility comments: Min assist with tactile cues to pelvic to increase roll to the right. Mod assist for sidelying to sit with therapist assisting with guiding LEs and trunk lift off.  Transfers Overall transfer level: Needs assistance Equipment used: Rolling walker (2 wheeled) Transfers: Sit to/from Stand Sit to Stand: Min assist Stand pivot transfers: Min assist       General transfer comment: Min assist to  stand from bed level with therapist assisting with power up and steading. Patient min assist to use RW to take steps and pivot to recliner - patient requiring min assistance for walker and verbal cues for technique and steadying. Patient's anxiety increased with steadying and making her unsafe and a fall risk.    Balance Overall balance assessment: Needs assistance;History of Falls Sitting-balance support: Feet supported;No upper extremity supported Sitting balance-Leahy Scale: Fair Sitting balance - Comments: Patient's hands on bed to assist with guarding and limit weight through pelvis to reduce pain.   Standing balance support: Bilateral upper extremity supported Standing balance-Leahy Scale: Poor                             ADL either performed or assessed with clinical judgement   ADL                                               Vision   Vision Assessment?: No apparent visual deficits   Perception     Praxis      Cognition Arousal/Alertness: Awake/alert Behavior During Therapy: WFL for tasks assessed/performed Overall Cognitive Status: Within Functional Limits for tasks assessed                                          Exercises  Shoulder Instructions       General Comments      Pertinent Vitals/ Pain       Pain Assessment: Faces Faces Pain Scale: Hurts whole lot Pain Location: pelvis Pain Descriptors / Indicators: Discomfort;Grimacing;Guarding;Sharp;Moaning Pain Intervention(s): Limited activity within patient's tolerance;Premedicated before session (reports receiving pain medicine)  Home Living                                          Prior Functioning/Environment              Frequency  Min 2X/week        Progress Toward Goals  OT Goals(current goals can now be found in the care plan section)  Progress towards OT goals: Progressing toward goals  Acute Rehab OT  Goals Patient Stated Goal: To get better OT Goal Formulation: With patient Time For Goal Achievement: 01/05/20 Potential to Achieve Goals: Good  Plan Discharge plan remains appropriate    Co-evaluation                 AM-PAC OT "6 Clicks" Daily Activity     Outcome Measure   Help from another person eating meals?: None Help from another person taking care of personal grooming?: A Little Help from another person toileting, which includes using toliet, bedpan, or urinal?: Total Help from another person bathing (including washing, rinsing, drying)?: A Lot Help from another person to put on and taking off regular upper body clothing?: A Little Help from another person to put on and taking off regular lower body clothing?: Total 6 Click Score: 14    End of Session Equipment Utilized During Treatment: Gait belt;Rolling walker  OT Visit Diagnosis: Unsteadiness on feet (R26.81);Other abnormalities of gait and mobility (R26.89);History of falling (Z91.81);Pain Pain - Right/Left:  (right pelvis)   Activity Tolerance Patient limited by pain   Patient Left in chair;with call bell/phone within reach;with chair alarm set   Nurse Communication Mobility status        Time: 0347-4259 OT Time Calculation (min): 17 min  Charges: OT General Charges $OT Visit: 1 Visit OT Treatments $Therapeutic Activity: 8-22 mins  Derl Barrow, OTR/L [place]  Office [phone] Pager: [place] 12/25/2019, 1:02 PM

## 2019-12-25 NOTE — Progress Notes (Signed)
Physical Therapy Treatment Patient Details Name: [name] [name] MRN: [id number] DOB: 08/09/1935 Today's Date: 12/25/2019    History of Present Illness 84 y.o. female admitted with R pubic ramus fx + R ischium/acetabulum fx. Hx of TIA, hypertension, hyperlipidemia, GERD, CKD 3, CAD, anxiety, chronic pain, vertigo, kyphoplasty    PT Comments    Pt continues to require +2 for safety when mobilizing. Mobility remains limited by pain. Pt appeared to be somewhat confused during session on today. Continue to recommend SNF rehab.    Follow Up Recommendations  SNF     Equipment Recommendations  Rolling walker with 5" wheels;3in1;Wheelchair    Recommendations for Other Services       Precautions / Restrictions Precautions Precautions: Fall Restrictions Weight Bearing Restrictions: No RLE Weight Bearing: Weight bearing as tolerated Other Position/Activity Restrictions: WBAT    Mobility  Bed Mobility Overal bed mobility: Needs Assistance Bed Mobility: Sit to Supine     Sit to supine: Mod assist;+2 for physical assistance;+2 for safety/equipment;HOB elevated   General bed mobility comments: Assist for trunk and bil LEs. Increased time. Cues for technique.  Transfers Overall transfer level: Needs assistance Equipment used: Rolling walker (2 wheeled) Transfers: Sit to/from Stand Sit to Stand: Min assist Stand pivot transfers: Min assist;+2 safety/equipment       General transfer comment: Assist to power up, stabilize, control descent. Increased time. Cues for safety, technique, hand placement. Stand pivot, recliner to bed, using RW  Ambulation/Gait Ambulation/Gait assistance: Min assist;+2 safety/equipment Gait Distance (Feet): 3 Feet Assistive device: Rolling walker (2 wheeled) Gait Pattern/deviations: Step-to pattern;Antalgic;Trunk flexed     General Gait Details: Side steps along side of bed with RW. Cues for safety, technique, sequencing, proper use of  RW.   Stairs             Wheelchair Mobility    Modified Rankin (Stroke Patients Only)       Balance Overall balance assessment: Needs assistance;History of Falls Sitting-balance support: Feet supported;No upper extremity supported Sitting balance-Leahy Scale: Fair Sitting balance - Comments: Patient's hands on bed to assist with guarding and limit weight through pelvis to reduce pain.   Standing balance support: Bilateral upper extremity supported Standing balance-Leahy Scale: Poor                              Cognition Arousal/Alertness: Awake/alert Behavior During Therapy: WFL for tasks assessed/performed Overall Cognitive Status: Impaired/Different from baseline Area of Impairment: Memory                     Memory: Decreased short-term memory         General Comments: pt appears somewhat confused on today      Exercises      General Comments        Pertinent Vitals/Pain Pain Assessment: Faces Faces Pain Scale: Hurts whole lot Pain Location: pelvis Pain Descriptors / Indicators: Discomfort;Grimacing;Guarding;Sharp;Moaning Pain Intervention(s): Limited activity within patient's tolerance;Monitored during session;Repositioned    Home Living                      Prior Function            PT Goals (current goals can now be found in the care plan section) Acute Rehab PT Goals Patient Stated Goal: To get better Progress towards PT goals: Progressing toward goals    Frequency    Min 3X/week  PT Plan Current plan remains appropriate    Co-evaluation              AM-PAC PT "6 Clicks" Mobility   Outcome Measure  Help needed turning from your back to your side while in a flat bed without using bedrails?: A Lot Help needed moving from lying on your back to sitting on the side of a flat bed without using bedrails?: A Lot Help needed moving to and from a bed to a chair (including a wheelchair)?: A  Lot Help needed standing up from a chair using your arms (e.g., wheelchair or bedside chair)?: A Little Help needed to walk in hospital room?: A Lot Help needed climbing 3-5 steps with a railing? : Total 6 Click Score: 12    End of Session   Activity Tolerance: Patient limited by pain;Patient limited by fatigue Patient left: in bed;with call bell/phone within reach;with nursing/sitter in room   PT Visit Diagnosis: Muscle weakness (generalized) (M62.81);Pain;Difficulty in walking, not elsewhere classified (R26.2);History of falling (Z91.81) Pain - part of body:  (pelvis)     Time: 4417-1278 PT Time Calculation (min) (ACUTE ONLY): 17 min  Charges:  $Gait Training: 8-22 mins                        Doreatha Massed, PT Acute Rehabilitation  Office: [phone] Pager: [phone]

## 2019-12-25 NOTE — Care Management (Signed)
Transition of Care (TOC) -30 day Note       Patient Details  Name: [name] [name] MHW:[id number] Date of Birth:11/26/1935   Transition of Care [facility]) CM/SW Contact  Name:Kailei Cowens RNCM  Phone [phone] Date:12/25/2019 Time:12:20p   MUST ID: [id number]   To Whom it May Concern:   Please be advised that the above patient will require a short-term nursing home stay, anticipated 30 days or less rehabilitation and strengthening. The plan is for return home.

## 2019-12-25 NOTE — NC FL2 (Addendum)
[place] LEVEL OF CARE SCREENING TOOL     IDENTIFICATION  Patient Name: [name] [name] Birthdate: July 29, 1935 Sex: female Admission Date (Current Location): 12/20/2019  [facility] and [place] Number:  Herbalist and Address:  [facility],  [address] [place], [place]      Provider Number: [id number]  Attending Physician Name and Address:  Eugenie Filler, MD  Relative Name and Phone Number:  Delfino Lovett spouse [phone]    Current Level of Care: Hospital Recommended Level of Care: [place] Prior Approval Number:    Date Approved/Denied:   PASRR Number:  [id number]  Discharge Plan: SNF    Current Diagnoses: Patient Active Problem List   Diagnosis Date Noted  . Pubic ramus fracture ([place]) 12/20/2019  . Senile purpura ([place]) 07/05/2019  . Atherosclerosis of autologous vein coronary artery bypass graft with angina pectoris ([place]) 07/05/2019  . Abnormal glucose 03/01/2018  . FHx: heart disease 03/01/2018  . Therapeutic opioid-induced constipation (OIC) 12/21/2017  . Abnormal UGI series 12/21/2017  . CKD (chronic kidney disease) stage 2, GFR 60-89 ml/min 09/27/2017  . T12 compression fracture ([place]) 09/26/2017  . Chronic bronchitis ([place]) 08/09/2017  . Atherosclerosis of aorta ([place]) 04/05/2017  . Anxiety state 06/08/2016  . Spinal stenosis, lumbar region, with neurogenic claudication 05/29/2015  . DDD (degenerative disc disease), lumbar 04/21/2015  . Hyperlipidemia, mixed 11/24/2014  . Gastroesophageal reflux disease 11/24/2014  . Depression, major, in partial remission ([place]) 09/26/2014  . Chronic pain syndrome 09/26/2014  . Coronary artery disease due to lipid rich plaque   . Osteoporosis 02/10/2014  . Prediabetes 08/14/2013  . Vitamin D deficiency 08/14/2013  . Chronic constipation 04/03/2013  . MGUS (monoclonal gammopathy of unknown significance) 03/05/2013  . Vertebral compression fracture ([place]) 06/23/2012   . ESOPHAGEAL STRICTURE 08/28/2008  . Mild intermittent asthma without complication 27/74/1287  . Incarcerated recurrent paraesophageal hiatal hernia s/p lap redo repair [id number] 01/31/2008  . Essential hypertension 07/25/2007    Orientation RESPIRATION BLADDER Height & Weight     Self, Time, Situation, Place  Normal Continent Weight: 70.8 kg Height:  5\' 4"  (162.6 cm)  BEHAVIORAL SYMPTOMS/MOOD NEUROLOGICAL BOWEL NUTRITION STATUS      Continent Diet (Heart Healthy)  AMBULATORY STATUS COMMUNICATION OF NEEDS Skin   Limited Assist Verbally Skin abrasions (bilateral knees-bruising-bandaids; R hand soft wrist splint)                       Personal Care Assistance Level of Assistance  Bathing, Dressing, Feeding Bathing Assistance: Limited assistance Feeding assistance: Limited assistance Dressing Assistance: Limited assistance     Functional Limitations Info  Sight, Speech, Hearing Sight Info: Adequate Hearing Info: Adequate Speech Info: Impaired (dentures-uppers/lowers)    SPECIAL CARE FACTORS FREQUENCY  PT (By licensed PT), OT (By licensed OT)     PT Frequency: 5x week OT Frequency: 5x week            Contractures Contractures Info: Not present    Additional Factors Info  Psychotropic (xanax;cymbalta anxiety) Code Status Info: Full code Allergies Info: Ace Inhibitors, Aspartame, Atorvastatin, Biaxin Clarithromycin, Daliresp Roflumilast, Erythromycin, Levofloxacin           Current Medications (12/25/2019):  This is the current hospital active medication list Current Facility-Administered Medications  Medication Dose Route Frequency Provider Last Rate Last Admin  . acetaminophen (TYLENOL) tablet 650 mg  650 mg Oral Q6H PRN Nita Sells, MD      .  ALPRAZolam (XANAX) tablet 0.25 mg  0.25 mg Oral Daily PRN Zierle-Ghosh, Asia B, DO      . amLODipine (NORVASC) tablet 10 mg  10 mg Oral Daily Zierle-Ghosh, Asia B, DO   10 mg at 12/25/19 1054  . aspirin EC  tablet 81 mg  81 mg Oral QHS Zierle-Ghosh, Asia B, DO   81 mg at 12/24/19 2122  . cyclobenzaprine (FLEXERIL) tablet 5 mg  5 mg Oral TID PRN Nita Sells, MD   5 mg at 12/23/19 2007  . darifenacin (ENABLEX) 24 hr tablet 7.5 mg  7.5 mg Oral Daily Zierle-Ghosh, Asia B, DO   7.5 mg at 12/25/19 1055  . DULoxetine (CYMBALTA) DR capsule 30 mg  30 mg Oral BID Zierle-Ghosh, Asia B, DO   30 mg at 12/25/19 1053  . gabapentin (NEURONTIN) capsule 400 mg  400 mg Oral QHS Nita Sells, MD   400 mg at 12/23/19 2141  . heparin injection 5,000 Units  5,000 Units Subcutaneous Q8H Zierle-Ghosh, Asia B, DO   5,000 Units at 12/25/19 0543  . ipratropium-albuterol (DUONEB) 0.5-2.5 (3) MG/3ML nebulizer solution 3 mL  3 mL Nebulization Q6H PRN Zierle-Ghosh, Asia B, DO      . lip balm (CARMEX) ointment   Topical PRN Nita Sells, MD      . menthol-cetylpyridinium (CEPACOL) lozenge 3 mg  1 lozenge Oral PRN Nita Sells, MD   3 mg at 12/24/19 1318  . methocarbamol (ROBAXIN) tablet 750 mg  750 mg Oral TID Nita Sells, MD      . metoprolol tartrate (LOPRESSOR) tablet 25 mg  25 mg Oral QHS Zierle-Ghosh, Asia B, DO   25 mg at 12/24/19 2120  . mometasone-formoterol (DULERA) 200-5 MCG/ACT inhaler 2 puff  2 puff Inhalation BID Zierle-Ghosh, Asia B, DO   2 puff at 12/25/19 1025  . morphine 2 MG/ML injection 1 mg  1 mg Intravenous Q4H PRN Nita Sells, MD   1 mg at 12/24/19 0433  . oxybutynin (DITROPAN-XL) 24 hr tablet 10 mg  10 mg Oral QHS Zierle-Ghosh, Asia B, DO   10 mg at 12/24/19 2120  . pantoprazole (PROTONIX) EC tablet 40 mg  40 mg Oral Daily Zierle-Ghosh, Asia B, DO   40 mg at 12/25/19 1053  . polyethylene glycol (MIRALAX / GLYCOLAX) packet 17 g  17 g Oral Daily PRN Zierle-Ghosh, Asia B, DO      . prochlorperazine (COMPAZINE) tablet 5 mg  5 mg Oral Q6H PRN Nita Sells, MD   5 mg at 12/23/19 1720  . promethazine (PHENERGAN) injection 12.5 mg  12.5 mg Intravenous Q8H  Nita Sells, MD   12.5 mg at 12/25/19 0543  . rOPINIRole (REQUIP) tablet 2 mg  2 mg Oral TID Zierle-Ghosh, Asia B, DO   2 mg at 12/25/19 1053  . senna-docusate (Senokot-S) tablet 2 tablet  2 tablet Oral BID Zierle-Ghosh, Asia B, DO   2 tablet at 12/25/19 1053  . sucralfate (CARAFATE) tablet 1 g  1 g Oral TID AC & HS Zierle-Ghosh, Asia B, DO   1 g at 12/25/19 1052  . traMADol (ULTRAM) tablet 100 mg  100 mg Oral Q6H Nita Sells, MD   100 mg at 12/25/19 1137     Discharge Medications: Please see discharge summary for a list of discharge medications.  Relevant Imaging Results:  Relevant Lab Results:   Additional Information ss#[id number]  Kimberlee Shoun, Juliann Pulse, RN

## 2019-12-25 NOTE — TOC Initial Note (Addendum)
Transition of Care [facility]) - Initial/Assessment Note    Patient Details  Name: [name] [name] MRN: [id number] Date of Birth: [birth year]  [facility] CM/SW Contact:    [name] Phi, RN Phone Number: 12/25/2019, 12:43 PM  Clinical Narrative: Patient/spouse both agreed to SNF-faxed out await bed offers-they prefer [place].Await [place].                  Expected Discharge Plan: Skilled Nursing Facility Barriers to Discharge: Continued Medical Work up   Patient Goals and CMS Choice Patient states their goals for this hospitalization and ongoing recovery are:: go to rehab CMS Medicare.gov Compare Post Acute Care list provided to:: Patient Choice offered to / list presented to : Patient, Spouse  Expected Discharge Plan and Services Expected Discharge Plan: [place]   Discharge Planning Services: CM Consult Post Acute Care Choice: [place] Living arrangements for the past 2 months: Single Family Home                                      Prior Living Arrangements/Services Living arrangements for the past 2 months: Single Family Home Lives with:: Spouse Patient language and need for interpreter reviewed:: Yes Do you feel safe going back to the place where you live?: Yes      Need for Family Participation in Patient Care: No (Comment) Care giver support system in place?: Yes (comment)   Criminal Activity/Legal Involvement Pertinent to Current Situation/Hospitalization: No - Comment as needed  Activities of Daily Living Home Assistive Devices/Equipment: Walker (specify type), Bedside commode/3-in-1, Dentures (specify type), Eyeglasses, Cane (specify quad or straight) (upper and lower ) ADL Screening (condition at time of admission) Patient's cognitive ability adequate to safely complete daily activities?: Yes Is the patient deaf or have difficulty hearing?: No Does the patient have difficulty seeing, even when  wearing glasses/contacts?: No Does the patient have difficulty concentrating, remembering, or making decisions?: No Patient able to express need for assistance with ADLs?: Yes Does the patient have difficulty dressing or bathing?: Yes Independently performs ADLs?: Yes (appropriate for developmental age) Communication: Independent Dressing (OT): Needs assistance Is this a change from baseline?: Change from baseline, expected to last >3 days Grooming: Needs assistance Is this a change from baseline?: Change from baseline, expected to last <3 days Feeding: Independent Bathing: Needs assistance Is this a change from baseline?: Change from baseline, expected to last >3 days Toileting: Needs assistance Is this a change from baseline?: Change from baseline, expected to last >3days In/Out Bed: Needs assistance Is this a change from baseline?: Change from baseline, expected to last >3 days Walks in Home: Independent with device (comment), Needs assistance Is this a change from baseline?: Change from baseline, expected to last >3 days Does the patient have difficulty walking or climbing stairs?: Yes Weakness of Legs: Both Weakness of Arms/Hands: None  Permission Sought/Granted Permission sought to share information with : Case Manager Permission granted to share information with : Yes, Verbal Permission Granted  Share Information with NAME: Case Manager     Permission granted to share info w Relationship: [name] [name] spouse [phone]     Emotional Assessment Appearance:: Appears stated age Attitude/Demeanor/Rapport: Gracious Affect (typically observed): Accepting Orientation: : Oriented to Self, Oriented to Place, Oriented to  Time, Oriented to Situation Alcohol / Substance Use: Not Applicable Psych Involvement: No (comment)  Admission diagnosis:  Pubic  ramus fracture ([place]) [S32.599A] Multiple closed fractures of pelvis without disruption of pelvic ring, initial encounter [facility])  [S32.82XA] Patient Active Problem List   Diagnosis Date Noted  . Pubic ramus fracture ([place]) 12/20/2019  . Senile purpura ([place]) 07/05/2019  . Atherosclerosis of autologous vein coronary artery bypass graft with angina pectoris ([place]) 07/05/2019  . Abnormal glucose 03/01/2018  . FHx: heart disease 03/01/2018  . Therapeutic opioid-induced constipation (OIC) 12/21/2017  . Abnormal UGI series 12/21/2017  . CKD (chronic kidney disease) stage 2, GFR 60-89 ml/min 09/27/2017  . T12 compression fracture ([place]) 09/26/2017  . Chronic bronchitis ([place]) 08/09/2017  . Atherosclerosis of aorta ([place]) 04/05/2017  . Anxiety state 06/08/2016  . Spinal stenosis, lumbar region, with neurogenic claudication 05/29/2015  . DDD (degenerative disc disease), lumbar 04/21/2015  . Hyperlipidemia, mixed 11/24/2014  . Gastroesophageal reflux disease 11/24/2014  . Depression, major, in partial remission ([place]) 09/26/2014  . Chronic pain syndrome 09/26/2014  . Coronary artery disease due to lipid rich plaque   . Osteoporosis 02/10/2014  . Prediabetes 08/14/2013  . Vitamin D deficiency 08/14/2013  . Chronic constipation 04/03/2013  . MGUS (monoclonal gammopathy of unknown significance) 03/05/2013  . Vertebral compression fracture ([place]) 06/23/2012  . ESOPHAGEAL STRICTURE 08/28/2008  . Mild intermittent asthma without complication 92/05/69  . Incarcerated recurrent paraesophageal hiatal hernia s/p lap redo repair [id number] 01/31/2008  . Essential hypertension 07/25/2007   PCP:  [name] Pinto, MD Pharmacy:   [facility] [place]), [place] - [address] [name] [name] ([place]) [place] [address] Phone: [phone] Fax: [place], [place] - [place] [address] Phone: [phone] Fax: [phone]     Social Determinants of Health (SDOH) Interventions    Readmission Risk  Interventions No flowsheet data found.

## 2019-12-25 NOTE — Progress Notes (Signed)
PROGRESS NOTE    [name] [name]  TML:[id number] DOB: 21-Sep-1935 DOA: 12/20/2019 PCP: [name] Pinto, MD   Chief Complaint  Patient presents with   Fall   Hip Pain    right hip    Brief Narrative:  76 white female prior TIA reflux CKD 3 anxiety CAD (PTCA 2003) hiatal hernia status post fundoplication 6812 admitted earlier this year for intractable nausea vomiting DM TY 2 CKD 2-3 Chronic low back pain pain syndrome status post kyphoplasty Dr. Nicholaus [name] On gabapentin Robaxin previously? Accidental fall at home 8/13 resulting in 10/10 pain in right hip-imaging showed pubis ramus fracture with acetabular fractures Previous also recent  fall 7/22 2021 with a trip and a fall and struck her right wrist and landed on her chest and was prescribed prednisone by nurse practitioner in the office for chest pain  Treated with Toradol Zofran in ED and admitted for pain control   Assessment & Plan:   Principal Problem:   Pubic ramus fracture ([place]) Active Problems:   Coronary artery disease involving native heart without angina pectoris   Ileus ([place])   Multiple closed pelvic fractures without disruption of pelvic circle (HCC)   Hyponatremia   Constipation   Impaired glucose tolerance   Hiatal hernia   History of fundoplication   Leukocytosis  1 right-sided pubic ramus fracture Nonoperative per orthopedics.  Secondary to mechanical fall.  Medications being adjusted for better pain control.  Patient noted to have some intolerance to IV morphine for severe pain due to nausea and vomiting.  Patient noted not to be able to take NSAIDs which have been discontinued.  Patient still rates her pain a 7/10 [name] [name] still needing some IV morphine with dose has been decreased.  Patient noted to be declining use of oxycodone, unable to take NSAIDs secondary to prior fundoplication.  Continue as needed Flexeril.  Patient also noted to be on Robaxin scheduled which patient seems to be refusing and  as such we will discontinue Robaxin. Patient barely moving pain seems not controlled at this time and is/not ready for discharge.  Patient is in agreement to go to a skilled nursing facility and as such social work consulted.  Place on scheduled Tylenol 3 times daily.  Continue Neurontin, Cymbalta.  Patient seen in consultation by Dr. Lorin [name] orthopedics who is recommending nonoperative management with outpatient follow-up post discharge in 1 to 2 weeks.  Follow.  2.  Mild ileus Likely secondary to immobility and from pain medications.  Patient has been placed on laxatives.  Continue Senokot S2 twice daily.  Continue MiraLAX as needed.  3.  Hypertension/coronary artery disease Continue Norvasc, Lopressor, aspirin.  4.  Hyponatremia Likely secondary to tea/toast Potomania from drinking too much fluid.  Patient fluid restricted to 1500 cc/day.  Sodium levels improving.  Follow.  5.  Severe constipation Patient given sorbitol with good effects.  Sorbitol discontinued.  Continue Senokot-S twice daily.  MiraLAX as needed.  May need to be placed on daily MiraLAX.  6.  Ischial hernia with fundoplication No NSAIDs.  Continue PPI, Carafate.  7.  Impaired glucose tolerance Outpatient follow-up with PCP.  8.  Prior spinal stenosis status post kyphoplasty in the remote past with chronic pain Continue current regimen of gabapentin.  Continue Prolia as well as salmon calcitonin presumably for osteoporotic fracture in the outpatient setting.  9.  Leukocytosis Felt likely steroid-induced.  Steroids discontinued.  Improved.  Follow.   DVT prophylaxis: Heparin Code Status: Full Family Communication: Updated patient  and husband at bedside. Disposition:   Status is: Inpatient    Dispo:  Patient From: Home  Planned Disposition: [place]  Expected discharge date: 12/26/19  Medically stable for discharge: No.  Pain currently being managed for better control.        Consultants:     Orthopedics: Dr. Lorin [name] 12/21/2019  Procedures:   CT pelvis 12/20/2019  Abdominal x-ray 12/24/2019  Chest x-ray 12/20/2019  Antimicrobials:   None   Subjective: Patient denies any chest pain.  No shortness of breath.  States still with significant pain however current pain regiment with some improvement with pain management however still with moderate pain.[name]  Seems agreeable for SNF now.  Having bowel movement.  Objective: Vitals:   12/24/19 2107 12/25/19 0508 12/25/19 1025 12/25/19 1500  BP: 133/76 139/73  130/78  Pulse: 90 69  79  Resp: (!) 22 (!) 22  14  Temp: 98.7 F (37.1 C) 98.3 F (36.8 C)  97.7 F (36.5 C)  TempSrc: Oral Oral  Oral  SpO2: 96% 94% 95% 95%  Weight:  70.8 kg    Height:        Intake/Output Summary (Last 24 hours) at 12/25/2019 1842 Last data filed at 12/25/2019 1500 Gross per 24 hour  Intake 720 ml  Output 900 ml  Net -180 ml   Filed Weights   12/23/19 0359 12/24/19 0500 12/25/19 0508  Weight: 73.2 kg 72.8 kg 70.8 kg    Examination:  General exam: Appears calm and comfortable  Respiratory system: Clear to auscultation. Respiratory effort normal. Cardiovascular system: S1 & S2 heard, RRR. No JVD, murmurs, rubs, gallops or clicks. No pedal edema. Gastrointestinal system: Abdomen is nondistended, soft and nontender. No organomegaly or masses felt. Normal bowel sounds heard. Central nervous system: Alert and oriented. No focal neurological deficits. Extremities: Symmetric 5 x 5 power. Skin: No rashes, lesions or ulcers Psychiatry: Judgement and insight appear normal. Mood & affect appropriate.     Data Reviewed: I have personally reviewed following labs and imaging studies  CBC: Recent Labs  Lab 12/20/19 2052 12/20/19 2052 12/21/19 1013 12/22/19 0532 12/23/19 0526 12/24/19 0859 12/25/19 0601  WBC 23.1*   < > 10.2 11.5* 11.0* 10.3 10.3  NEUTROABS 19.3*  --   --  9.3* 7.6 6.9 6.4  HGB 13.6   < > 13.6 14.7 14.0 14.3 14.7  HCT 41.7    < > 42.3 45.2 44.0 43.8 46.0  MCV 95.2   < > 97.2 95.4 96.9 96.3 97.0  PLT 416*   < > 366 307 293 274 267   < > = values in this interval not displayed.    Basic Metabolic Panel: Recent Labs  Lab 12/21/19 1013 12/22/19 0532 12/23/19 0526 12/24/19 0859 12/25/19 0601  NA 139 133* 130* 128* 130*  K 4.0 4.3 4.4 4.1 4.2  CL 105 97* 96* 95* 97*  CO2 25 23 24 23 23   GLUCOSE 106* 143* 122* 106* 98  BUN 20 13 13 12 15   CREATININE 0.68 0.63 0.68 0.59 0.68  CALCIUM 8.0* 8.3* 8.5* 8.2* 8.5*  MG 2.2  --   --   --   --   PHOS  --  2.4*  --   --   --     GFR: Estimated Creatinine Clearance: 52.3 mL/min (by C-G formula based on SCr of 0.68 mg/dL).  Liver Function Tests: Recent Labs  Lab 12/21/19 1013 12/22/19 0532 12/24/19 0859 12/25/19 0601  AST 871*  --  112* 33  ALT 621*  --  199* 145*  ALKPHOS 184*  --  239* 224*  BILITOT 3.5*  --  1.9* 1.4*  PROT 6.4*  --  6.3* 6.6  ALBUMIN 3.8 3.8 3.5 3.5    CBG: No results for input(s): GLUCAP in the last 168 hours.   Recent Results (from the past 240 hour(s))  SARS Coronavirus 2 by RT PCR (hospital order, performed in [facility] hospital lab) Nasopharyngeal     Status: None   Collection Time: 12/20/19  8:52 PM   Specimen: Nasopharyngeal  Result Value Ref Range Status   SARS Coronavirus 2 NEGATIVE NEGATIVE Final    Comment: (NOTE) SARS-CoV-2 target nucleic acids are NOT DETECTED.  The SARS-CoV-2 RNA is generally detectable in upper and lower respiratory specimens during the acute phase of infection. The lowest concentration of SARS-CoV-2 viral copies this assay can detect is 250 copies / mL. A negative result does not preclude SARS-CoV-2 infection and should not be used as the sole basis for treatment or other patient management decisions.  A negative result may occur with improper specimen collection / handling, submission of specimen other than nasopharyngeal swab, presence of viral mutation(s) within the areas targeted by  this assay, and inadequate number of viral copies (<250 copies / mL). A negative result must be combined with clinical observations, patient history, and epidemiological information.  Fact Sheet for Patients:   StrictlyIdeas.no  Fact Sheet for Healthcare Providers: BankingDealers.co.za  This test is not yet approved or  cleared by the [place] FDA and has been authorized for detection and/or diagnosis of SARS-CoV-2 by FDA under an Emergency Use Authorization (EUA).  This EUA will remain in effect (meaning this test can be used) for the duration of the COVID-19 declaration under Section 564(b)(1) of the Act, 21 U.S.C. section 360bbb-3(b)(1), unless the authorization is terminated or revoked sooner.  Performed at [facility], [place] [address] [place], [place] [address]          Radiology Studies: DG Abd 2 Views  Result Date: 12/24/2019 CLINICAL DATA:  Nausea and vomiting EXAM: ABDOMEN - 2 VIEW COMPARISON:  04/12/2018 FINDINGS: Mildly distended loops of large and small bowel. No free air identified. Small air-fluid levels noted. Multilevel vertebroplasty in the thoracic and lumbar spine. Pedicle screw fusion L1 through L3. IMPRESSION: Mild ileus Electronically Signed   By: Franchot Gallo M.D.   On: 12/24/2019 11:33        Scheduled Meds:  acetaminophen  500 mg Oral TID   amLODipine  10 mg Oral Daily   aspirin EC  81 mg Oral QHS   darifenacin  7.5 mg Oral Daily   DULoxetine  30 mg Oral BID   gabapentin  400 mg Oral QHS   heparin  5,000 Units Subcutaneous Q8H   methocarbamol  750 mg Oral TID   metoprolol tartrate  25 mg Oral QHS   mometasone-formoterol  2 puff Inhalation BID   pantoprazole  40 mg Oral Daily   promethazine  12.5 mg Intravenous Q8H   rOPINIRole  2 mg Oral TID   senna-docusate  2 tablet Oral BID   sucralfate  1 g Oral TID AC & HS   traMADol  100 mg Oral Q6H    Continuous Infusions:   LOS: 5 days    Time spent: 35 minutes    Irine Seal, MD [facility]   To contact the attending provider between 7A-7P or the covering provider during after hours  7P-7A, please log into the web site [email or web address] and access using universal [place] password for that web site. If you do not have the password, please call the hospital operator.  12/25/2019, 6:42 PM

## 2019-12-26 LAB — CBC WITH DIFFERENTIAL/PLATELET
Abs Immature Granulocytes: 0.21 10*3/uL — ABNORMAL HIGH (ref 0.00–0.07)
Basophils Absolute: 0.1 10*3/uL (ref 0.0–0.1)
Basophils Relative: 1 %
Eosinophils Absolute: 0.2 10*3/uL (ref 0.0–0.5)
Eosinophils Relative: 2 %
HCT: 44.8 % (ref 36.0–46.0)
Hemoglobin: 14.4 g/dL (ref 12.0–15.0)
Immature Granulocytes: 2 %
Lymphocytes Relative: 17 %
Lymphs Abs: 1.9 10*3/uL (ref 0.7–4.0)
MCH: 31.2 pg (ref 26.0–34.0)
MCHC: 32.1 g/dL (ref 30.0–36.0)
MCV: 97 fL (ref 80.0–100.0)
Monocytes Absolute: 1.5 10*3/uL — ABNORMAL HIGH (ref 0.1–1.0)
Monocytes Relative: 14 %
Neutro Abs: 7.2 10*3/uL (ref 1.7–7.7)
Neutrophils Relative %: 64 %
Platelets: 265 10*3/uL (ref 150–400)
RBC: 4.62 MIL/uL (ref 3.87–5.11)
RDW: 14.5 % (ref 11.5–15.5)
WBC: 11.2 10*3/uL — ABNORMAL HIGH (ref 4.0–10.5)
nRBC: 0 % (ref 0.0–0.2)

## 2019-12-26 LAB — COMPREHENSIVE METABOLIC PANEL
ALT: 95 U/L — ABNORMAL HIGH (ref 0–44)
AST: 18 U/L (ref 15–41)
Albumin: 3.4 g/dL — ABNORMAL LOW (ref 3.5–5.0)
Alkaline Phosphatase: 173 U/L — ABNORMAL HIGH (ref 38–126)
Anion gap: 11 (ref 5–15)
BUN: 21 mg/dL (ref 8–23)
CO2: 21 mmol/L — ABNORMAL LOW (ref 22–32)
Calcium: 8.6 mg/dL — ABNORMAL LOW (ref 8.9–10.3)
Chloride: 96 mmol/L — ABNORMAL LOW (ref 98–111)
Creatinine, Ser: 0.87 mg/dL (ref 0.44–1.00)
GFR calc Af Amer: >60 mL/min (ref >60)
GFR calc non Af Amer: >60 mL/min (ref >60)
Glucose, Bld: 101 mg/dL — ABNORMAL HIGH (ref 70–99)
Potassium: 3.8 mmol/L (ref 3.5–5.1)
Sodium: 128 mmol/L — ABNORMAL LOW (ref 135–145)
Total Bilirubin: 1 mg/dL (ref 0.3–1.2)
Total Protein: 6.7 g/dL (ref 6.5–8.1)

## 2019-12-26 LAB — BASIC METABOLIC PANEL
Anion gap: 12 (ref 5–15)
BUN: 21 mg/dL (ref 8–23)
CO2: 24 mmol/L (ref 22–32)
Calcium: 8.9 mg/dL (ref 8.9–10.3)
Chloride: 96 mmol/L — ABNORMAL LOW (ref 98–111)
Creatinine, Ser: 0.76 mg/dL (ref 0.44–1.00)
GFR calc Af Amer: >60 mL/min (ref >60)
GFR calc non Af Amer: >60 mL/min (ref >60)
Glucose, Bld: 101 mg/dL — ABNORMAL HIGH (ref 70–99)
Potassium: 4.3 mmol/L (ref 3.5–5.1)
Sodium: 132 mmol/L — ABNORMAL LOW (ref 135–145)

## 2019-12-26 LAB — URIC ACID: Uric Acid, Serum: 6.3 mg/dL (ref 2.5–7.1)

## 2019-12-26 LAB — OSMOLALITY: Osmolality: 272 mOsm/kg — ABNORMAL LOW (ref 275–295)

## 2019-12-26 LAB — OSMOLALITY, URINE: Osmolality, Ur: 433 mOsm/kg (ref 300–900)

## 2019-12-26 LAB — SODIUM, URINE, RANDOM: Sodium, Ur: <10 mmol/L

## 2019-12-26 MED ORDER — OXYCODONE HCL 5 MG PO TABS
5.0000 mg | ORAL_TABLET | Freq: Four times a day (QID) | ORAL | Status: DC | PRN
  Administered 2019-12-26 – 2019-12-27 (×2): 5 mg via ORAL
  Filled 2019-12-26 (×2): qty 1

## 2019-12-26 NOTE — Progress Notes (Signed)
PROGRESS NOTE    [name] [name]  SNK:[id number] DOB: 07/14/1935 DOA: 12/20/2019 PCP: [name], MD   Chief Complaint  Patient presents with  . Fall  . Hip Pain    right hip    Brief Narrative:  47 white female prior TIA reflux CKD 3 anxiety CAD (PTCA 2003) hiatal hernia status post fundoplication 9379 admitted earlier this year for intractable nausea vomiting DM TY 2 CKD 2-3 Chronic low back pain pain syndrome status post kyphoplasty Dr. Nicholaus Bloom On gabapentin Robaxin previously? Accidental fall at home 8/13 resulting in 10/10 pain in right hip-imaging showed pubis ramus fracture with acetabular fractures Previous also recent  fall 7/22 2021 with a trip and a fall and struck her right wrist and landed on her chest and was prescribed prednisone by nurse practitioner in the office for chest pain  Treated with Toradol Zofran in ED and admitted for pain control   Assessment & Plan:   Principal Problem:   Pubic ramus fracture ([place]) Active Problems:   Coronary artery disease involving native heart without angina pectoris   Ileus ([place])   Multiple closed pelvic fractures without disruption of pelvic circle (HCC)   Hyponatremia   Constipation   Impaired glucose tolerance   Hiatal hernia   History of fundoplication   Leukocytosis  1 right-sided pubic ramus fracture Nonoperative per orthopedics.  Secondary to mechanical fall.  Medications being adjusted for better pain control.  Patient noted to have some intolerance to IV morphine for severe pain due to nausea and vomiting.  Patient noted not to be able to take NSAIDs which have been discontinued.  Patient still rates her pain a 8/10 after pain medication, still needing some IV morphine with dose has been decreased.  Patient noted to be declining use of oxycodone early on in the hospitalization however is in agreement for short trial for acute pain control.  Patient unable to take NSAIDs secondary to prior fundoplication.   Continue as needed Flexeril.  Patient also noted to be on Robaxin scheduled which patient seems to be refusing and as such Robaxin has been discontinued.  Patient barely moving pain seems not controlled at this time and is/not ready for discharge.  Patient is in agreement to go to a skilled nursing facility and as such social work consulted.  Continue scheduled Tylenol 3 times daily, Neurontin, Cymbalta, Flexeril as needed.  Placed on oxycodone as needed.  Patient seen in consultation by Dr. Lorin Mercy orthopedics who is recommending nonoperative management with outpatient follow-up post discharge in 1 to 2 weeks.  Follow.  2.  Mild ileus Likely secondary to immobility and from pain medications.  Patient has been placed on laxatives.  Continue Senokot S2 twice daily.  Continue MiraLAX as needed.  3.  Hypertension/coronary artery disease Blood pressure currently controlled on current regimen of Lopressor and Norvasc.  Continue aspirin.   4.  Hyponatremia Likely secondary to tea/toast Potomania from drinking too much fluid.  Patient fluid restricted to 1500 cc/day.  Sodium levels and currently at 128.  Check a urine and serum osmolality.  Check urine sodium.  BMET this afternoon.  Follow.   5.  Severe constipation Patient given sorbitol with good effects.  Sorbitol discontinued.  Continue Senokot-S twice daily.  MiraLAX as needed.  May need to be placed on daily MiraLAX.  6.  Hiatial hernia with fundoplication No NSAIDs.  PPI.  Carafate.   7.  Impaired glucose tolerance Outpatient follow-up with PCP.  8.  Prior spinal  stenosis status post kyphoplasty in the remote past with chronic pain Continue current regimen of gabapentin.  Continue Prolia as well as salmon calcitonin presumably for osteoporotic fracture in the outpatient setting.  9.  Leukocytosis Felt likely steroid-induced.  Steroids discontinued.  Improved.  Follow.  10.  Lower abdominal discomfort Concern for urinary retention.  I and O  cath.  If patient with significant urinary retention may need to place a Foley catheter and likely needs outpatient follow-up with urology.  Follow.   DVT prophylaxis: Heparin Code Status: Full Family Communication: Updated patient.  No family at bedside.  Disposition:   Status is: Inpatient    Dispo:  Patient From: Home  Planned Disposition: [place]  Expected discharge date: 12/26/19  Medically stable for discharge: No.  Pain currently being managed for better control.        Consultants:   Orthopedics: Dr. Lorin Mercy 12/21/2019  Procedures:   CT pelvis 12/20/2019  Abdominal x-ray 12/24/2019  Chest x-ray 12/20/2019  Antimicrobials:   None   Subjective: Patient sitting up in bed.  Complaining of some lower abdominal discomfort.  States still with significant pain however no significant improvement over the past 24 hours with pain management.  Describes pain as a 8 out of 10 after pain regimen.  Patient now open to trying a short course of opioid pain medication for better pain relief.  Objective: Vitals:   12/25/19 1500 12/25/19 2020 12/25/19 2036 12/26/19 0521  BP: 130/78  130/67 (!) 142/74  Pulse: 79  94 71  Resp: 14  (!) 22 20  Temp: 97.7 F (36.5 C)  98.2 F (36.8 C) 98.3 F (36.8 C)  TempSrc: Oral  Oral Oral  SpO2: 95% 94% 93% 94%  Weight:    70.8 kg  Height:        Intake/Output Summary (Last 24 hours) at 12/26/2019 1224 Last data filed at 12/25/2019 1900 Gross per 24 hour  Intake 240 ml  Output 550 ml  Net -310 ml   Filed Weights   12/24/19 0500 12/25/19 0508 12/26/19 0521  Weight: 72.8 kg 70.8 kg 70.8 kg    Examination:  General exam: NAD.  Respiratory system: Clear to auscultation bilaterally.  No wheezes, no crackles, rhonchi.  Normal respiratory effort.   Cardiovascular system: Regular rate rhythm no murmurs rubs or gallops.  No JVD.  No lower extremity edema.   Gastrointestinal system: Abdomen is soft, nondistended,  positive bowel sounds.  No rebound.  No guarding.  Some suprapubic/lower abdominal discomfort to palpation. Central nervous system: Alert and oriented. No focal neurological deficits. Extremities: Symmetric 5 x 5 power. Skin: No rashes, lesions or ulcers Psychiatry: Judgement and insight appear normal. Mood & affect appropriate.     Data Reviewed: I have personally reviewed following labs and imaging studies  CBC: Recent Labs  Lab 12/22/19 0532 12/23/19 0526 12/24/19 0859 12/25/19 0601 12/26/19 0542  WBC 11.5* 11.0* 10.3 10.3 11.2*  NEUTROABS 9.3* 7.6 6.9 6.4 7.2  HGB 14.7 14.0 14.3 14.7 14.4  HCT 45.2 44.0 43.8 46.0 44.8  MCV 95.4 96.9 96.3 97.0 97.0  PLT 307 293 274 267 682    Basic Metabolic Panel: Recent Labs  Lab 12/21/19 1013 12/21/19 1013 12/22/19 0532 12/23/19 0526 12/24/19 0859 12/25/19 0601 12/26/19 0542  NA 139   < > 133* 130* 128* 130* 128*  K 4.0   < > 4.3 4.4 4.1 4.2 3.8  CL 105   < > 97* 96* 95* 97* 96*  CO2 25   < > 23 24 23 23  21*  GLUCOSE 106*   < > 143* 122* 106* 98 101*  BUN 20   < > 13 13 12 15 21   CREATININE 0.68   < > 0.63 0.68 0.59 0.68 0.87  CALCIUM 8.0*   < > 8.3* 8.5* 8.2* 8.5* 8.6*  MG 2.2  --   --   --   --   --   --   PHOS  --   --  2.4*  --   --   --   --    < > = values in this interval not displayed.    GFR: Estimated Creatinine Clearance: 48.1 mL/min (by C-G formula based on SCr of 0.87 mg/dL).  Liver Function Tests: Recent Labs  Lab 12/21/19 1013 12/22/19 0532 12/24/19 0859 12/25/19 0601 12/26/19 0542  AST 871*  --  112* 33 18  ALT 621*  --  199* 145* 95*  ALKPHOS 184*  --  239* 224* 173*  BILITOT 3.5*  --  1.9* 1.4* 1.0  PROT 6.4*  --  6.3* 6.6 6.7  ALBUMIN 3.8 3.8 3.5 3.5 3.4*    CBG: No results for input(s): GLUCAP in the last 168 hours.   Recent Results (from the past 240 hour(s))  SARS Coronavirus 2 by RT PCR (hospital order, performed in [facility] hospital lab) Nasopharyngeal     Status: None    Collection Time: 12/20/19  8:52 PM   Specimen: Nasopharyngeal  Result Value Ref Range Status   SARS Coronavirus 2 NEGATIVE NEGATIVE Final    Comment: (NOTE) SARS-CoV-2 target nucleic acids are NOT DETECTED.  The SARS-CoV-2 RNA is generally detectable in upper and lower respiratory specimens during the acute phase of infection. The lowest concentration of SARS-CoV-2 viral copies this assay can detect is 250 copies / mL. A negative result does not preclude SARS-CoV-2 infection and should not be used as the sole basis for treatment or other patient management decisions.  A negative result may occur with improper specimen collection / handling, submission of specimen other than nasopharyngeal swab, presence of viral mutation(s) within the areas targeted by this assay, and inadequate number of viral copies (<250 copies / mL). A negative result must be combined with clinical observations, patient history, and epidemiological information.  Fact Sheet for Patients:   StrictlyIdeas.no  Fact Sheet for Healthcare Providers: BankingDealers.co.za  This test is not yet approved or  cleared by the [place] FDA and has been authorized for detection and/or diagnosis of SARS-CoV-2 by FDA under an Emergency Use Authorization (EUA).  This EUA will remain in effect (meaning this test can be used) for the duration of the COVID-19 declaration under Section 564(b)(1) of the Act, 21 U.S.C. section 360bbb-3(b)(1), unless the authorization is terminated or revoked sooner.  Performed at [facility], [place] [address] [place], [place] [address]          Radiology Studies: No results found.      Scheduled Meds: . acetaminophen  500 mg Oral TID  . amLODipine  10 mg Oral Daily  . aspirin EC  81 mg Oral QHS  . calcitonin (salmon)  1 spray Alternating Nares Daily  . cholecalciferol  5,000 Units Oral BID  . darifenacin  7.5 mg  Oral Daily  . DULoxetine  30 mg Oral BID  . gabapentin  300 mg Oral QID  . heparin  5,000 Units Subcutaneous Q8H  . metoprolol tartrate  25 mg  Oral QHS  . mometasone-formoterol  2 puff Inhalation BID  . pantoprazole  40 mg Oral Daily  . promethazine  12.5 mg Intravenous Q8H  . rOPINIRole  2 mg Oral TID  . senna-docusate  2 tablet Oral BID  . sucralfate  1 g Oral TID AC & HS  . traMADol  100 mg Oral Q6H  . vitamin B-12  1,000 mcg Oral Daily   Continuous Infusions:   LOS: 6 days    Time spent: 35 minutes    Irine Seal, MD [facility]   To contact the attending provider between 7A-7P or the covering provider during after hours 7P-7A, please log into the web site [email or web address] and access using universal [place] password for that web site. If you do not have the password, please call the hospital operator.  12/26/2019, 12:24 PM

## 2019-12-26 NOTE — Progress Notes (Signed)
In and out cath completed with 850 ml urine returned.  Will continue to monitor.

## 2019-12-26 NOTE — TOC Progression Note (Signed)
Transition of Care [facility]) - Progression Note    Patient Details  Name: [name] [name] MRN: [id number] Date of Birth: 01-Oct-1935  Transition of [facility]) CM/SW Contact  Aiyla Baucom, Juliann Pulse, RN Phone Number: 12/26/2019, 10:05 AM  Clinical Narrative: Bed offers given to patient/spouse-await choice.   1. 1.7 mi [place] at [place], [place] [address] [phone] Overall rating Below average 2. 2 mi [facility] at the [place], [place] [address] [phone] Overall rating Below average 3. 2.1 mi [place] at [place], [place] [address] [phone] Overall rating Much below average 4. 2.2 mi [place] [facility] [employer] [place], [place] [address] [phone] Overall rating Much above average 5. 2.2 mi [place], [place] [address] [phone] Overall rating Below average 6. 2.7 [place], [place] [address] [phone] Overall rating Much above average 7. 3.2 mi [place] [address] [place], [place] [address] [phone] Overall rating Below average 8. 3.3 [place] [address], [place] [address] [phone] Overall rating Below average 9. 3.5 mi [facility] [place], [place] [address] [phone] Overall rating Average 10. 4.3 [place], [place] [address] [phone] Overall rating Below average 11. 4.7 mi [facility] at [place], [place] [address] [phone] Overall rating Much above average 12. 5.2 mi [facility] [place], [place] [address] [phone] Overall rating Much above average 13. 6.2 mi [place], [place] [address] [phone] Overall rating Average 14. 8.1 [place], [place] [address] [phone] Overall rating Above average 15. 8.1 mi [facility] and [place], [place] [address] [phone] Overall rating Much below average 16. 9.7 mi [facility] [address] [place], [place] [address] [phone] Overall rating Below average 17. [place] [address] [place], [place] [address] [phone] Overall rating Much above average 18. 11.4 mi [place] at [facility] [address] [place], [place] [address] [phone] Overall rating Much above average 19. 13.5 [facility] [address] [place], [place] [address] [phone] Overall rating Much below average 20. 13.5 mi [facility] [address] [place], [place] [address] [phone] Overall rating Much below average 21. 14.5 [place] [address] [place], [place] [address] [phone] Overall rating Much below average 22. 14.9 mi The [place] [address] [place], [place] [address] [phone] Overall rating Much below average 23. 15.1 mi [facility] at [place], [place] [address] [phone] Overall rating Below average 24. 15.3 mi [place] and [facility] [place], [place] [address] [phone] Overall rating Average 25. [place], [place] [address] [phone] Overall rating Much above average 26. 16.2 [place],  [address] [phone] Overall rating Much above average 27. 16.6 mi [place]  [address] [place], [place] [address] [phone] Overall rating Below average 28. 17.2 mi [facility] [place], [place] [address] [phone] Overall rating Average 29. 20.2 [facility] [address] [place], [place] [address] [phone] Overall rating Much below average 30. 19.4 mi [address] at [employer] at [facility], [place] [address] [phone] Overall rating Much above average 31. 20.6 mi [facility] and [facility] [address] [place], [place] [address] [phone] Overall rating Below average 32. 20.7 [place], [place] [address] [phone] Overall rating Average 33. 30.1 [facility] [address] [place], [place] [address] [phone] Overall rating Below average 34. 21.1 [place], [place] [address] [phone] Overall rating Much above average 35. [place] [address] [place], [place] [address] [phone] Overall rating Below average 36. 22.4 [place] [address] [place], [place] [address] [phone] Overall rating Below average 37. 22.7 mi [facility] [address] [place], [place] [address] [phone] Overall rating Above average 38. 22.8 [address] [place], [place] [address] [phone] Overall rating Much above average 39. 22.9 [place], [place] [address] [phone] Overall rating Below average 40. [place], [place] [address] [phone] Overall rating Much below  average 41. 24.4 mi [facility] and [facility] [address] [place], [place] [address] [phone] Overall rating Below average 42. 24.6 [facility] [address] [place], [place] [address] [phone] Overall rating Much below average 43. 24.6 mi [place], [place] [address] [phone] Overall rating Much below average 44. 24.9 mi [place] [address] [place],  [address] [phone] Overall rating      Expected Discharge Plan: [place] Barriers to Discharge: Continued Medical Work up  Expected Discharge Plan and Services Expected Discharge Plan: [place]   Discharge Planning Services: CM Consult Post Acute Care Choice: [place] arrangements for the past 2 months: Single Family Home                                       Social Determinants of Health (SDOH) Interventions    Readmission Risk Interventions No flowsheet data found.

## 2019-12-26 NOTE — Progress Notes (Signed)
Report received from Ene A., RN. No change from initial pm assessment. Will continue to monitor and follow the POC.

## 2019-12-26 NOTE — Care Management Important Message (Signed)
Important Message  Patient Details IM Letter given to Patient Name: [name] [name] MRN: [id number] Date of Birth: 1936/01/25   Medicare Important Message Given:  Yes     Kerin Salen 12/26/2019, 10:42 AM

## 2019-12-27 ENCOUNTER — Inpatient Hospital Stay (HOSPITAL_COMMUNITY): Payer: PPO

## 2019-12-27 DIAGNOSIS — R7989 Other specified abnormal findings of blood chemistry: Secondary | ICD-10-CM

## 2019-12-27 DIAGNOSIS — R1310 Dysphagia, unspecified: Secondary | ICD-10-CM

## 2019-12-27 DIAGNOSIS — B3781 Candidal esophagitis: Secondary | ICD-10-CM

## 2019-12-27 LAB — COMPREHENSIVE METABOLIC PANEL
ALT: 64 U/L — ABNORMAL HIGH (ref 0–44)
AST: 14 U/L — ABNORMAL LOW (ref 15–41)
Albumin: 3.4 g/dL — ABNORMAL LOW (ref 3.5–5.0)
Alkaline Phosphatase: 151 U/L — ABNORMAL HIGH (ref 38–126)
Anion gap: 10 (ref 5–15)
BUN: 18 mg/dL (ref 8–23)
CO2: 25 mmol/L (ref 22–32)
Calcium: 9.2 mg/dL (ref 8.9–10.3)
Chloride: 98 mmol/L (ref 98–111)
Creatinine, Ser: 0.82 mg/dL (ref 0.44–1.00)
GFR calc Af Amer: >60 mL/min (ref >60)
GFR calc non Af Amer: >60 mL/min (ref >60)
Glucose, Bld: 117 mg/dL — ABNORMAL HIGH (ref 70–99)
Potassium: 4.3 mmol/L (ref 3.5–5.1)
Sodium: 133 mmol/L — ABNORMAL LOW (ref 135–145)
Total Bilirubin: 1 mg/dL (ref 0.3–1.2)
Total Protein: 6.5 g/dL (ref 6.5–8.1)

## 2019-12-27 MED ORDER — ALUM & MAG HYDROXIDE-SIMETH 200-200-20 MG/5ML PO SUSP
30.0000 mL | ORAL | Status: DC | PRN
  Administered 2019-12-27 – 2019-12-29 (×2): 30 mL via ORAL
  Filled 2019-12-27 (×2): qty 30

## 2019-12-27 MED ORDER — GADOBUTROL 1 MMOL/ML IV SOLN
7.0000 mL | Freq: Once | INTRAVENOUS | Status: AC | PRN
  Administered 2019-12-27: 7 mL via INTRAVENOUS

## 2019-12-27 MED ORDER — FLUCONAZOLE 100 MG PO TABS
200.0000 mg | ORAL_TABLET | Freq: Every day | ORAL | Status: DC
  Administered 2019-12-28 – 2019-12-31 (×4): 200 mg via ORAL
  Filled 2019-12-27 (×4): qty 2

## 2019-12-27 MED ORDER — FLUCONAZOLE 100 MG PO TABS
400.0000 mg | ORAL_TABLET | Freq: Once | ORAL | Status: AC
  Administered 2019-12-27: 400 mg via ORAL
  Filled 2019-12-27: qty 4

## 2019-12-27 NOTE — Consult Note (Signed)
Referring Provider: Dr. Irine Seal Primary Care Physician:  [name], MD Primary Gastroenterologist:  Dr. Duanne Guess  Reason for Consultation:  Dysphagia  HPI: [name] [name] is a 84 y.o. female with a past medical history of asthma, TIA, DM II, chronic kidney disease stage III, CAD s/p PTCA in 2003, osteoporosis, chronic lower back pain, GERD, past Nissan fundoplication in 1219.  She is well know by Dr. Havery Moros from our GI office. She's undergone numerous EGDs from 2002 to 07/2019. History of GERD, dysphagia with a tortuous esophagus and esophageal candidiasis. Past Nissen Fundoplication. Past cholecystectomy. She was admitted to [facility] long hospital on 12/20/2019 after she fell on the stairs outside of her house which resulted in a right hip injury.  In the ED her BP was 157/74. HR 72. She was diagnosed with a pubic ramus fracture. Surgery was not required.  A GI consult was requested today due to the onset of dysphagia.  She noticed some difficulty swallowing solid food yesterday, she described feeling as if food was stuck to her lower esophagus, she gagged and the stuck food was expelled.  She ate eggs and chicken sausage this morning and again felt the food was stuck to her mid to lower esophagus below the breast line. She gagged and expelled the stuck food out.  She continues to have discomfort to the lower esophagus and epigastric area.  No nausea or vomiting.  She is currently tolerating a clear liquid diet without difficulty. She was prescribed a Prednisone taper for approximately 1 week by her primary after she fell and injured her wrist 11/28/2019. She fell 11/28/2019 and she injured her right wrist and she landed on her chest.  She was seen by her primary NP and she was prescribed Prednisone for about 1 week.  She takes aspirin 81 mg every other day.  No other NSAIDs.  No recent antibiotics. She takes a PPI daily and Carafate bid "for years".   In review of her hospital admission  laboratory studies, her LFTs are elevated since admission.     12/21/2019: Alk phos 184.  AST 871.  ALT 621. Total bili 3.5. 12/24/2019: Alk phos 239.  AST 112.  ALT 199. Total bili 1.9. 12/25/2019: Alk phos 224.  AST 33.  ALT 145.   Total bili 1.4. 12/26/2019: Alk phos 173.  AST 18.  ALT 95.     Total bili 1.0. 12/27/2019: Alk phos 151.  AST 14.  ALT 64.     Total bili 1.0. . Labs 12/26/2019: WBC 11.2.  (Admission WBC 23.1 thought to be due to recent Prednisone use). Hemoglobin 14.4.  Hematocrit 44.8.  Platelet 265.  Abdominal imaging has not been done this admission.  Most recent GI procedures:  EGD 07/11/2019: - Z-line regular. - Esophageal plaques were found, consistent with candidiasis. - Tortuous esophagus with angulated turn entering Nissen. - Normal esophagus otherwise - A Nissen fundoplication was found. The wrap appears intact with retained suture visualized. - Normal stomach otherwise - biopsies taken to rule out H pylori - Normal duodenal bulb and second portion of the duodenum. Unclear if esophageal candidiasis of the lower esophagus could be related to symptoms, will treat for it and assess response. Pain could otherwise be musculoskeletal in etiology based on prior evaluation.  EGD 02/20/2018: - Esophagogastric landmarks identified. - Tortuous esophagus with angulated turn into the fundoplication without obvious stenosis - I suspect this is likely causing the patient's symptoms and barium study findings, likely due to altered anatomy  from fundoplication. Balloon dilation performed to the area to 81m, no mucosal wrents appreciated. - Multiple plaques in the lower third of the esophagus. Brushings performed to rule out Candidiasis. - A Nissen fundoplication was found, the wrap was intact. - Gastritis. Biopsied. - Normal duodenal bulb and second portion of the duodenum.  EGD 01/24/2018: - Z-line, 35 cm from the incisors. - Suspect mild esophagitis dissecans - Tortous  esophagus with angulated entrance to fundoplication versus mild stenosis - dilation not performed given volume of food in the stomach - A large amount of food (residue) in the stomach, the procedure was aborted.  Colonoscopy 05/17/2017: - One 4 mm polyp in the cecum, removed with a cold snare. Resected and retrieved. - Two 4 to 5 mm polyps in the ascending colon, removed with a cold snare. Resected and retrieved. - Two 4 to 5 mm polyps in the transverse colon, removed with a cold snare. Resected and retrieved. - One 6 mm polyp in the descending colon, removed with a cold snare. Resected and retrieved. - Diverticulosis in the entire examined colon. - Internal hemorrhoids. - Tortous colon - The examination was otherwise normal. - Recall colonoscopy 3 years Surgical [P], ascending, cecum, transverse, descending, polyp (6) - TUBULAR ADENOMA (SEVEN FRAGMENTS). - NEGATIVE FOR DYSPLASIA OR MALIGNANCY.   Past Medical History:  Diagnosis Date  . Anxiety    takes Xanax daily as needed  . Asthma    Albuterol daily as needed  . Blood transfusion without reported diagnosis   . CAD (coronary artery disease)    LHC 2003 with 40% pLAD, 30% mLAD, 100% D1 (moderate vessel), 100%  D2 (small vessel), 95% mid small OM2.  Pt had PTCA to D1;  D2 and OM2 small vessels and tx medically;  Last Myoview (2012): anterior infarct seen with scar, no ischemia. EF normal. Med Rx // Myoview (12/14):  Low risk; ant scar w/ peri-infarct ischemia; EF 55% with ant AK // Myoview 5/16: EF 55-65, ant, apical scar, no ischemia   . Cataract   . Chronic back pain    HNP, DDD, spinal stenosis, vertebral compression fractures.   . Chronic nausea    takes Zofran daily as needed.  normal gastric emptying study 03/2013  . CKD (chronic kidney disease), stage III 09/27/2017  . Constipation    felt to be functional. takes Senokot and Miralax daily.  treated with Linzess in past.   . DIVERTICULOSIS, COLON 08/28/2008   Qualifier:  Diagnosis of  By: FMare Ferrari [name], [name]    . Elevated LFTs 2015   probably med induced DILI, from Augmentin vs Pravastatin  . GERD (gastroesophageal reflux disease)    hx of esophageal stricture   . Hiatal hernia    Paraesophageal hernias. s/p fundoplications in 24580and 04/2013  . History of bronchitis several of yrs ago  . History of colon polyps 03/2009, 03/2011   adenomatous colon polyps, no high grade dysplasia.   .Marland KitchenHistory of vertigo    no meds  . Hyperlipidemia    takes Pravastatin daily  . Hypertension    takes Amlodipine,Metoprolol,and Losartan daily  . Nausea 03/2016  . Osteoporosis   . PONV (postoperative nausea and vomiting)    yrs ago  . Slow urinary stream    takes Rapaflo daily  . TIA (transient ischemic attack) 1995   no residual effects noted    Past Surgical History:  Procedure Laterality Date  . APPENDECTOMY     patient unsure of date  . BACK  SURGERY    . BLADDER REPAIR    . CHOLECYSTECTOMY     Patient unsure of date.  . CORONARY ANGIOPLASTY  11/16/2001   1 stent  . ESOPHAGEAL MANOMETRY N/A 03/25/2013   Procedure: ESOPHAGEAL MANOMETRY (EM);  Surgeon: Inda Castle, MD;  Location: [facility] ENDOSCOPY;  Service: Endoscopy;  Laterality: N/A;  . ESOPHAGOGASTRODUODENOSCOPY N/A 03/15/2013   Procedure: ESOPHAGOGASTRODUODENOSCOPY (EGD);  Surgeon: Jerene Bears, MD;  Location[facility] ENDOSCOPY;  Service: Endoscopy;  Laterality: N/A;  . ESOPHAGOGASTRODUODENOSCOPY N/A 12/25/2015   Procedure: ESOPHAGOGASTRODUODENOSCOPY (EGD);  Surgeon: Doran Stabler, MD;  Location: [facility] ENDOSCOPY;  Service: Endoscopy;  Laterality: N/A;  . EYE SURGERY  11/2000   bilateral cataracts with lens implant  . FIXATION KYPHOPLASTY LUMBAR SPINE  X 2   "L3-4"  . HEMORRHOID SURGERY    . HIATAL HERNIA REPAIR  06/03/2011   Procedure: LAPAROSCOPIC REPAIR OF HIATAL HERNIA;  Surgeon: Adin Hector, MD;  Location: [facility] ORS;  Service: General;  Laterality: N/A;  . HIATAL HERNIA REPAIR N/A 04/24/2013    Procedure: LAPAROSCOPIC  REPAIR RECURRENT PARASOPHAGEAL HIATAL HERNIA WITH FUNDOPLICATION;  Surgeon: Adin Hector, MD;  Location: [facility] ORS;  Service: General;  Laterality: N/A;  . INSERTION OF MESH N/A 04/24/2013   Procedure: INSERTION OF MESH;  Surgeon: Adin Hector, MD;  Location: [facility] ORS;  Service: General;  Laterality: N/A;  . IR KYPHO THORACIC WITH BONE BIOPSY  09/29/2017  . IR KYPHO THORACIC WITH BONE BIOPSY  11/03/2017  . IR KYPHO THORACIC WITH BONE BIOPSY  02/14/2019  . IR KYPHO THORACIC WITH BONE BIOPSY  04/02/2019  . LAPAROSCOPIC LYSIS OF ADHESIONS N/A 04/24/2013   Procedure: LAPAROSCOPIC LYSIS OF ADHESIONS;  Surgeon: Adin Hector, MD;  Location: [facility] ORS;  Service: General;  Laterality: N/A;  . LAPAROSCOPIC NISSEN FUNDOPLICATION  2/67/1245   Procedure: LAPAROSCOPIC NISSEN FUNDOPLICATION;  Surgeon: Adin Hector, MD;  Location: [facility] ORS;  Service: General;  Laterality: N/A;  . LUMBAR FUSION  12/07/2015   Right L2-3 facetectomy, posterolateral fusion L2-3 with pedicle screws, rods, local bone graft, Vivigen cancellous chips. Fusion extended to the L1 level with pedicle screws and rods  . LUMBAR LAMINECTOMY/DECOMPRESSION MICRODISCECTOMY Right 06/26/2012   Procedure: LUMBAR LAMINECTOMY/DECOMPRESSION MICRODISCECTOMY;  Surgeon: Jessy Oto, MD;  Location: [place];  Service: Orthopedics;  Laterality: Right;  RIGHT L4-5 MICRODISCECTOMY  . LUMBAR LAMINECTOMY/DECOMPRESSION MICRODISCECTOMY N/A 05/29/2015   Procedure: RIGHT L2-3 MICRODISCECTOMY;  Surgeon: Jessy Oto, MD;  Location: [place];  Service: Orthopedics;  Laterality: N/A;  . TOTAL ABDOMINAL HYSTERECTOMY  1975   partial  . UPPER GASTROINTESTINAL ENDOSCOPY      Prior to Admission medications   Medication Sig Start Date End Date Taking? Authorizing Provider  acetaminophen (TYLENOL) 500 MG tablet Take 1 tablet (500 mg total) by mouth every 6 (six) hours as needed. Patient taking differently: Take 500 mg by mouth every 6 (six) hours as  needed for moderate pain.  03/18/18  Yes Bast, Traci A, NP  ALPRAZolam (XANAX) 0.5 MG tablet Take 1/2 tablet 1- 2 x /day ONLY if needed for Anxiety Attack &  limit to 5 days /week to avoid Addiction - DO NOT take with Hydrocodone or Codeine Patient taking differently: Take 0.25-0.5 mg by mouth daily as needed for anxiety. Take 1/2 tablet 1- 2 x /day ONLY if needed for Anxiety Attack &  limit to 5 days /week to avoid Addiction - DO NOT take with Hydrocodone or Codeine 04/20/19  Yes  [name], MD  amLODipine (NORVASC) 10 MG tablet Take 1 tablet Daily for BP & Heart 02/07/19  Yes [name], MD  aspirin EC 81 MG tablet Take 81 mg by mouth at bedtime.    Yes [provider]  Cholecalciferol (VITAMIN D3) 5000 units CAPS Take 5,000 Units by mouth 2 (two) times daily.    Yes [provider]  denosumab (PROLIA) 60 MG/ML SOSY injection Inject 60 mg into the skin every 6 (six) months. 08/20/19  Yes Deveshwar, Abel Presto, MD  diclofenac sodium (VOLTAREN) 1 % GEL Apply 2 g topically 4 (four) times daily. Patient taking differently: Apply 2 g topically 4 (four) times daily as needed (pain).  03/18/18  Yes Bast, Traci A, NP  Fluticasone-Salmeterol (ADVAIR DISKUS) 250-50 MCG/DOSE AEPB Inhale 1 puff into the lungs in the morning and at bedtime. 10/16/19  Yes Vicie Mutters, PA-C  gabapentin (NEURONTIN) 300 MG capsule Take 300 mg by mouth 4 (four) times daily as needed (pain).  12/18/19  Yes [provider]  Glycopyrrolate-Formoterol (BEVESPI AEROSPHERE) 9-4.8 MCG/ACT AERO Inhale 1 puff into the lungs daily.   Yes [provider]  ipratropium-albuterol (DUONEB) 0.5-2.5 (3) MG/3ML SOLN Take 3 mLs by nebulization every 6 (six) hours as needed (shortness of breath/wheezing).    Yes [provider]  ketoconazole (NIZORAL) 2 % cream Apply 1 application topically 2 x /day Patient taking differently: Apply 1 application topically 2 (two) times daily. Apply 1 application  topically 2 x /day 11/28/19  Yes [name], MD  Magnesium 400 MG TABS Take 400 mg by mouth 2 (two) times daily.    Yes [provider]  meclizine (ANTIVERT) 25 MG tablet Take 1/2 to 1 tablet 2 to 3 x /day ONLY if needed  For Dizziness / Vertigo Patient taking differently: Take 12.5-25 mg by mouth 3 (three) times daily as needed for dizziness. Take 1/2 to 1 tablet 2 to 3 x /day ONLY if needed  For Dizziness / Vertigo 06/28/18  Yes [name], MD  metoprolol tartrate (LOPRESSOR) 50 MG tablet Take 1 tablet 2 x /day for BP & Heart Patient taking differently: Take 25 mg by mouth at bedtime.  12/23/18  Yes [name], MD  Multiple Vitamins-Minerals (MULTIVITAMIN WITH MINERALS) tablet Take 1 tablet by mouth daily.   Yes [provider]  nitroGLYCERIN (NITROSTAT) 0.4 MG SL tablet Place 1 tablet (0.4 mg total) under the tongue every 5 (five) minutes as needed for chest pain. 09/04/14  Yes Larey Dresser, MD  omeprazole (PRILOSEC) 40 MG capsule Take 1 capsule 2 x /day for Indigestion , Heartburn & Nausea Patient taking differently: Take 40 mg by mouth 2 (two) times daily. Take 1 capsule 2 x /day for Indigestion , Heartburn & Nausea 04/18/19  Yes [name], MD  polyethylene glycol powder (GLYCOLAX/MIRALAX) powder Take 17 g by mouth 2 (two) times daily. Patient taking differently: Take 17 g by mouth daily.  05/16/17  Yes Armbruster, Carlota Raspberry, MD  predniSONE (DELTASONE) 20 MG tablet 2 tablets daily for 3 days, 1 tablet daily for 4 days. 12/10/19  Yes McClanahan, Danton Sewer, NP  rOPINIRole (REQUIP) 4 MG tablet Take 1 tablet at Bedtime for Rest Legs 11/01/19  Yes Vicie Mutters, PA-C  sucralfate (CARAFATE) 1 g tablet Take 1 tablet 4 x /day before Meals & Bedtime for Indigestion & Heartburn Patient taking differently: Take 1 g by mouth 4 (four) times daily. Take 1 tablet 4 x /day before Meals & Bedtime for Indigestion &  Heartburn 08/10/19  Yes [name], MD  trospium (SANCTURA)  20 MG tablet Take 20 mg by mouth 2 (two) times daily. 12/02/19  Yes [provider]  vitamin B-12 (CYANOCOBALAMIN) 1000 MCG tablet Take 1,000 mcg by mouth daily.   Yes [provider]  baclofen (LIORESAL) 10 MG tablet Take 1/2 to 1 tablet 2 x day if needed for muscle spasm Patient not taking: Reported on 12/20/2019 11/27/19   Vicie Mutters, PA-C  dicyclomine (BENTYL) 20 MG tablet Take 1 tablet 3 x  /day before meals for Cramping, Nausea or Diarhea Patient not taking: Reported on 12/20/2019 08/06/19   [name], MD  DULoxetine (CYMBALTA) 30 MG capsule Start taking 1 cap daily, for mood and chronic pain; increase to 2 caps in 1 month if tolerating well. Patient not taking: Reported on 12/20/2019 07/05/19   [name], NP  gabapentin (NEURONTIN) 800 MG tablet Take 1/2 to 1 tablet 3 x /day for Pain Patient not taking: Reported on 12/20/2019 05/20/19   [name], MD  HYDROcodone-acetaminophen (NORCO/VICODIN) 5-325 MG tablet Take 1 tablet by mouth every 4 (four) hours as needed for moderate pain. Patient not taking: Reported on 12/05/2019 11/29/19   Suzan Slick, NP  oxybutynin (DITROPAN-XL) 10 MG 24 hr tablet Take 1 tablet at Bedtime for Bladder Patient not taking: Reported on 12/20/2019 09/30/19   [name], MD  prochlorperazine (COMPAZINE) 10 MG tablet Take 1 tablet 3 x /day before for Meals to Prevent Nausea Patient not taking: Reported on 12/20/2019 09/06/19   [name], MD  promethazine (PHENERGAN) 25 MG tablet Take 1 tablet every 4 hours if needed for Nausea or Vomitting Patient not taking: Reported on 12/20/2019 09/06/19   [name], MD  senna-docusate (SENOKOT-S) 8.6-50 MG tablet Take 2 tablets by mouth 2 (two) times daily. Patient not taking: Reported on 12/20/2019 12/26/15   Roxan Hockey, MD    Current Facility-Administered Medications  Medication Dose Route Frequency Provider Last Rate Last Admin  . acetaminophen (TYLENOL) tablet 500 mg   500 mg Oral TID Eugenie Filler, MD   500 mg at 12/27/19 1133  . acetaminophen (TYLENOL) tablet 650 mg  650 mg Oral Q6H PRN Nita Sells, MD   650 mg at 12/25/19 1451  . ALPRAZolam (XANAX) tablet 0.25 mg  0.25 mg Oral Daily PRN Zierle-Ghosh, Asia B, DO      . alum & mag hydroxide-simeth (MAALOX/MYLANTA) 200-200-20 MG/5ML suspension 30 mL  30 mL Oral Q4H PRN Eugenie Filler, MD   30 mL at 12/27/19 1411  . amLODipine (NORVASC) tablet 10 mg  10 mg Oral Daily Zierle-Ghosh, Asia B, DO   10 mg at 12/27/19 1132  . aspirin EC tablet 81 mg  81 mg Oral QHS Zierle-Ghosh, Asia B, DO   81 mg at 12/26/19 2126  . calcitonin (salmon) (MIACALCIN/FORTICAL) nasal spray 1 spray  1 spray Alternating Nares Daily Eugenie Filler, MD   1 spray at 12/27/19 1131  . cholecalciferol (VITAMIN D3) tablet 5,000 Units  5,000 Units Oral BID Eugenie Filler, MD   5,000 Units at 12/26/19 2125  . cyclobenzaprine (FLEXERIL) tablet 5 mg  5 mg Oral TID PRN Nita Sells, MD   5 mg at 12/23/19 2007  . darifenacin (ENABLEX) 24 hr tablet 7.5 mg  7.5 mg Oral Daily Zierle-Ghosh, Asia B, DO   7.5 mg at 12/27/19 1132  . DULoxetine (CYMBALTA) DR capsule 30 mg  30 mg Oral BID Zierle-Ghosh, Asia B, DO  30 mg at 12/27/19 1133  . gabapentin (NEURONTIN) capsule 300 mg  300 mg Oral QID Eugenie Filler, MD   300 mg at 12/27/19 1133  . heparin injection 5,000 Units  5,000 Units Subcutaneous Q8H Zierle-Ghosh, Asia B, DO   5,000 Units at 12/27/19 0549  . ipratropium-albuterol (DUONEB) 0.5-2.5 (3) MG/3ML nebulizer solution 3 mL  3 mL Nebulization Q6H PRN Zierle-Ghosh, Asia B, DO      . lip balm (CARMEX) ointment   Topical PRN Nita Sells, MD      . menthol-cetylpyridinium (CEPACOL) lozenge 3 mg  1 lozenge Oral PRN Nita Sells, MD   3 mg at 12/24/19 1318  . metoprolol tartrate (LOPRESSOR) tablet 25 mg  25 mg Oral QHS Zierle-Ghosh, Asia B, DO   25 mg at 12/26/19 2125  . mometasone-formoterol (DULERA) 200-5  MCG/ACT inhaler 2 puff  2 puff Inhalation BID Zierle-Ghosh, Asia B, DO   2 puff at 12/27/19 0751  . morphine 2 MG/ML injection 1 mg  1 mg Intravenous Q4H PRN Nita Sells, MD   1 mg at 12/24/19 0433  . oxyCODONE (Oxy IR/ROXICODONE) immediate release tablet 5 mg  5 mg Oral Q6H PRN Eugenie Filler, MD   5 mg at 12/27/19 0801  . pantoprazole (PROTONIX) EC tablet 40 mg  40 mg Oral Daily Zierle-Ghosh, Asia B, DO   40 mg at 12/27/19 1133  . polyethylene glycol (MIRALAX / GLYCOLAX) packet 17 g  17 g Oral Daily PRN Zierle-Ghosh, Asia B, DO      . prochlorperazine (COMPAZINE) tablet 5 mg  5 mg Oral Q6H PRN Nita Sells, MD   5 mg at 12/23/19 1720  . promethazine (PHENERGAN) injection 12.5 mg  12.5 mg Intravenous Q8H Nita Sells, MD   12.5 mg at 12/27/19 0543  . rOPINIRole (REQUIP) tablet 2 mg  2 mg Oral TID Zierle-Ghosh, Asia B, DO   2 mg at 12/27/19 1132  . senna-docusate (Senokot-S) tablet 2 tablet  2 tablet Oral BID Zierle-Ghosh, Asia B, DO   2 tablet at 12/27/19 1132  . sucralfate (CARAFATE) tablet 1 g  1 g Oral TID AC & HS Zierle-Ghosh, Asia B, DO   1 g at 12/27/19 1133  . traMADol (ULTRAM) tablet 100 mg  100 mg Oral Q6H Nita Sells, MD   100 mg at 12/27/19 1132  . vitamin B-12 (CYANOCOBALAMIN) tablet 1,000 mcg  1,000 mcg Oral Daily Eugenie Filler, MD   1,000 mcg at 12/27/19 1132    Allergies as of 12/20/2019 - Review Complete 12/20/2019  Allergen Reaction Noted  . Ace inhibitors Cough 04/03/2013  . Aspartame Diarrhea and Nausea And Vomiting   . Atorvastatin Other (See Comments) 09/04/2014  . Biaxin [clarithromycin] Other (See Comments) 03/07/2014  . Daliresp [roflumilast] Nausea And Vomiting 05/23/2011  . Erythromycin Other (See Comments) 03/07/2014  . Levofloxacin Nausea And Vomiting 11/04/2013    Family History  Problem Relation Age of Onset  . Colon cancer Mother 63  . Heart attack Father 62       heart attack  . Brain cancer Brother         tumor   . Heart disease Brother   . Heart disease Brother   . Kidney disease Daughter   . Esophageal cancer Neg Hx   . Stomach cancer Neg Hx     Social History   Socioeconomic History  . Marital status: Married    Spouse name: [name]  . Number of children: 2  . Years  of education: 26  . Highest education level: Not on file  Occupational History  . Occupation: Retired    Fish farm manager: RETIRED    Comment: worked at [employer]  . Smoking status: Never Smoker  . Smokeless tobacco: Never Used  Vaping Use  . Vaping Use: Never used  Substance and Sexual Activity  . Alcohol use: No  . Drug use: Never  . Sexual activity: Not on file  Other Topics Concern  . Not on file  Social History Narrative   Lives with husband.   Right-handed.   3 cups caffeine per day.   Social Determinants of Health   Financial Resource Strain:   . Difficulty of Paying Living Expenses: Not on file  Food Insecurity:   . Worried About Charity fundraiser in the Last Year: Not on file  . Ran Out of Food in the Last Year: Not on file  Transportation Needs:   . Lack of Transportation (Medical): Not on file  . Lack of Transportation (Non-Medical): Not on file  Physical Activity:   . Days of Exercise per Week: Not on file  . Minutes of Exercise per Session: Not on file  Stress:   . Feeling of Stress : Not on file  Social Connections:   . Frequency of Communication with Friends and Family: Not on file  . Frequency of Social Gatherings with Friends and Family: Not on file  . Attends Religious Services: Not on file  . Active Member of Clubs or Organizations: Not on file  . Attends Archivist Meetings: Not on file  . Marital Status: Not on file  Intimate Partner Violence:   . Fear of Current or Ex-Partner: Not on file  . Emotionally Abused: Not on file  . Physically Abused: Not on file  . Sexually Abused: Not on file    Review of Systems:  See HPI, all other systems  reviewed and are negative   Physical Exam: Vital signs in last 24 hours: Temp:  [98.7 F (37.1 C)-98.8 F (37.1 C)] 98.8 F (37.1 C) (08/20 1327) Pulse Rate:  [76-86] 78 (08/20 1327) Resp:  [18-22] 18 (08/20 1327) BP: (132-139)/(62-67) 139/66 (08/20 1327) SpO2:  [92 %-96 %] 96 % (08/20 1327) Weight:  [69.6 kg] 69.6 kg (08/20 0530) Last BM Date: 12/25/19 (per patient. )   General:  Alert  84 year old female in NAD.  Head:  Normocephalic and atraumatic. Eyes:  No scleral icterus. Conjunctiva pink. Ears:  Normal auditory acuity. Nose:  No deformity, discharge or lesions. Mouth: Scattered white plaques to upper palate, buccal mucosa and tongue. Upper dentures in place, briefly removed. Neck:  Supple. No lymphadenopathy or thyromegaly.  Lungs:  Breath sounds clear throughout.  Heart:  RRR, soft systolic murmur.  Abdomen: Soft, mild epigastric tenderness, no rebound or guarding. No mass. No HSM.  Rectal: Deferred. Musculoskeletal:  Symmetrical without gross deformities.  Pulses:  Normal pulses noted. Extremities:  Without clubbing or edema. Neurologic:  Alert and  oriented x4. No focal deficits.  Skin:  Intact without significant lesions or rashes. Psych:  Alert and cooperative. Normal mood and affect.  Intake/Output from previous day: 08/19 0701 - 08/20 0700 In: 120 [P.O.:120] Out: 1670 [Urine:1670] Intake/Output this shift: No intake/output data recorded.  Lab Results: Recent Labs    12/25/19 0601 12/26/19 0542  WBC 10.3 11.2*  HGB 14.7 14.4  HCT 46.0 44.8  PLT 267 265   BMET Recent Labs    12/26/19  0542 12/26/19 1331 12/27/19 0518  NA 128* 132* 133*  K 3.8 4.3 4.3  CL 96* 96* 98  CO2 21* 24 25  GLUCOSE 101* 101* 117*  BUN _0 CREATININE 0.87 0.76 0.82  CALCIUM 8.6* 8.9 9.2   LFT Recent Labs    12/27/19 0518  PROT 6.5  ALBUMIN 3.4*  AST 14*  ALT 64*  ALKPHOS 151*  BILITOT 1.0   PT/INR No results for input(s): LABPROT, INR in the last 72  hours. Hepatitis Panel No results for input(s): HEPBSAG, HCVAB, HEPAIGM, HEPBIGM in the last 72 hours.    Studies/Results: No results found.  IMPRESSION/PLAN:  36. 84 year old female with a history of GERD, tortuous esophagus, candidiasis esophagitis with past Nissan fundoplication with the development of dysphagia. She has a significant amount of oral candidiasis on exam today.  I suspect she has recurrence of esophageal candidiasis.  She was recently prescribed a brief Prednisone taper for a right wrist injury which increases her risk for candidiasis.  -Fluconazole 200 mg 2 tabs p.o. x 1 today then 200 mg p.o. daily for 20 days -Continue Pantoprazole 40 mg p.o. daily -If no improvement to consider EGD on Sunday 8/22 -Clear liquid diet for now  2.  Lower chest and epigastric pain which developed 2 days ago with elevated alk phos, AST/ALT and total bilirubin levels since admission.  Admission Alk phos 184. AST 871. ALT 621. T. Bili 3.5. LFTs and total bilirubin levels drifting downward. Today Alk phos 151. AST 14. ALT 64.  T. Bili 1.0.  -Abdominal MRI with MRCP w/wo contrast  -Repeat hepatic panel with GGT and CBC in a.m.  3.  Pubic ramus fracture  4. History of CAD s/p stent 2003  5. History of DM II  6. History of CKD stage III. Cr 0.82. GFR > 60.  7. History of tubular adenomatous colon polyps. -Next colonoscopy due Jan 2022    Noralyn Pick  12/27/2019, 2:51 PM

## 2019-12-27 NOTE — Plan of Care (Signed)
  Problem: Activity: Goal: Risk for activity intolerance will decrease Outcome: Progressing   

## 2019-12-27 NOTE — NC FL2 (Signed)
[place] LEVEL OF CARE SCREENING TOOL     IDENTIFICATION  Patient Name: [name] [name] Birthdate: [birth year] Sex: female Admission Date (Current Location): 12/20/2019  [facility] and [place] Number:  Herbalist and Address:  [facility],  [place], [place]      Provider Number: [id number]  Attending Physician Name and Address:  Eugenie Filler, MD  Relative Name and Phone Number:  Delfino Lovett spouse [phone]    Current Level of Care: Hospital Recommended Level of Care: [place] Prior Approval Number:    Date Approved/Denied:   PASRR Number: [id number]  Discharge Plan: SNF    Current Diagnoses: Patient Active Problem List   Diagnosis Date Noted  . Multiple closed pelvic fractures without disruption of pelvic circle (HCC)   . Hyponatremia   . Constipation   . Impaired glucose tolerance   . Hiatal hernia   . History of fundoplication   . Leukocytosis   . Pubic ramus fracture ([place]) 12/20/2019  . Senile purpura ([place]) 07/05/2019  . 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 Abnormal glucose 03/01/2018  . FHx: heart disease 03/01/2018  . Therapeutic opioid-induced constipation (OIC) 12/21/2017  . Abnormal UGI series 12/21/2017  . CKD (chronic kidney disease) stage 2, GFR 60-89 ml/min 09/27/2017  . T12 compression fracture ([place]) 09/26/2017  . Chronic bronchitis ([place]) 08/09/2017  . Atherosclerosis of aorta ([place]) 04/05/2017  . Anxiety state 06/08/2016  . Ileus ([place])   . Spinal stenosis, lumbar region, with neurogenic claudication 05/29/2015  . DDD (degenerative disc disease), lumbar 04/21/2015  . Hyperlipidemia, mixed 11/24/2014  . Gastroesophageal reflux disease 11/24/2014  . Depression, major, in partial remission ([place]) 09/26/2014  . Chronic pain syndrome 09/26/2014  . Coronary artery disease involving native heart without angina  pectoris   . Osteoporosis 02/10/2014  . Prediabetes 08/14/2013  . Vitamin D deficiency 08/14/2013  . Chronic constipation 04/03/2013  . MGUS (monoclonal gammopathy of unknown significance) 03/05/2013  . Vertebral compression fracture ([place]) 06/23/2012  . ESOPHAGEAL STRICTURE 08/28/2008  . Mild intermittent asthma without complication 76/72/0947  . Incarcerated recurrent paraesophageal hiatal hernia s/p lap redo repair [id number] 01/31/2008  . Essential hypertension 07/25/2007    Orientation RESPIRATION BLADDER Height & Weight     Self, Time, Situation, Place  Normal Continent Weight: 69.6 kg Height:  5\' 4"  (162.6 cm)  BEHAVIORAL SYMPTOMS/MOOD NEUROLOGICAL BOWEL NUTRITION STATUS      Continent Diet (Heart Healthy)  AMBULATORY STATUS COMMUNICATION OF NEEDS Skin   Limited Assist Verbally Skin abrasions (bilateral knees-bruising-bandaids; R hand soft wrist splint)                       Personal Care Assistance Level of Assistance  Bathing, Dressing, Feeding Bathing Assistance: Limited assistance Feeding assistance: Limited assistance Dressing Assistance: Limited assistance     Functional Limitations Info  Sight, Speech, Hearing Sight Info: Adequate Hearing Info: Adequate Speech Info: Impaired (dentures-uppers/lowers)    SPECIAL CARE FACTORS FREQUENCY  PT (By licensed PT), OT (By licensed OT)     PT Frequency: 5x week OT Frequency: 5x week            Contractures Contractures Info: Not present    Additional Factors Info  Psychotropic (xanax;cymbalta anxiety) Code Status Info: Full code Allergies Info: Ace Inhibitors, Aspartame, Atorvastatin, Biaxin Clarithromycin, Daliresp Roflumilast, Erythromycin, Levofloxacin  Current Medications (12/27/2019):  This is the current hospital active medication list Current Facility-Administered Medications  Medication Dose Route Frequency Provider Last Rate Last Admin  . acetaminophen (TYLENOL) tablet 500 mg  500 mg  Oral TID Eugenie Filler, MD   500 mg at 12/27/19 1133  . acetaminophen (TYLENOL) tablet 650 mg  650 mg Oral Q6H PRN Nita Sells, MD   650 mg at 12/25/19 1451  . ALPRAZolam (XANAX) tablet 0.25 mg  0.25 mg Oral Daily PRN Zierle-Ghosh, Asia B, DO      . alum & mag hydroxide-simeth (MAALOX/MYLANTA) 200-200-20 MG/5ML suspension 30 mL  30 mL Oral Q4H PRN Eugenie Filler, MD   30 mL at 12/27/19 1411  . amLODipine (NORVASC) tablet 10 mg  10 mg Oral Daily Zierle-Ghosh, Asia B, DO   10 mg at 12/27/19 1132  . aspirin EC tablet 81 mg  81 mg Oral QHS Zierle-Ghosh, Asia B, DO   81 mg at 12/26/19 2126  . calcitonin (salmon) (MIACALCIN/FORTICAL) nasal spray 1 spray  1 spray Alternating Nares Daily Eugenie Filler, MD   1 spray at 12/27/19 1131  . cholecalciferol (VITAMIN D3) tablet 5,000 Units  5,000 Units Oral BID Eugenie Filler, MD   5,000 Units at 12/26/19 2125  . cyclobenzaprine (FLEXERIL) tablet 5 mg  5 mg Oral TID PRN Nita Sells, MD   5 mg at 12/23/19 2007  . darifenacin (ENABLEX) 24 hr tablet 7.5 mg  7.5 mg Oral Daily Zierle-Ghosh, Asia B, DO   7.5 mg at 12/27/19 1132  . DULoxetine (CYMBALTA) DR capsule 30 mg  30 mg Oral BID Zierle-Ghosh, Asia B, DO   30 mg at 12/27/19 1133  . gabapentin (NEURONTIN) capsule 300 mg  300 mg Oral QID Eugenie Filler, MD   300 mg at 12/27/19 1133  . heparin injection 5,000 Units  5,000 Units Subcutaneous Q8H Zierle-Ghosh, Asia B, DO   5,000 Units at 12/27/19 0549  . ipratropium-albuterol (DUONEB) 0.5-2.5 (3) MG/3ML nebulizer solution 3 mL  3 mL Nebulization Q6H PRN Zierle-Ghosh, Asia B, DO      . lip balm (CARMEX) ointment   Topical PRN Nita Sells, MD      . menthol-cetylpyridinium (CEPACOL) lozenge 3 mg  1 lozenge Oral PRN Nita Sells, MD   3 mg at 12/24/19 1318  . metoprolol tartrate (LOPRESSOR) tablet 25 mg  25 mg Oral QHS Zierle-Ghosh, Asia B, DO   25 mg at 12/26/19 2125  . mometasone-formoterol (DULERA) 200-5 MCG/ACT  inhaler 2 puff  2 puff Inhalation BID Zierle-Ghosh, Asia B, DO   2 puff at 12/27/19 0751  . morphine 2 MG/ML injection 1 mg  1 mg Intravenous Q4H PRN Nita Sells, MD   1 mg at 12/24/19 0433  . oxyCODONE (Oxy IR/ROXICODONE) immediate release tablet 5 mg  5 mg Oral Q6H PRN Eugenie Filler, MD   5 mg at 12/27/19 0801  . pantoprazole (PROTONIX) EC tablet 40 mg  40 mg Oral Daily Zierle-Ghosh, Asia B, DO   40 mg at 12/27/19 1133  . polyethylene glycol (MIRALAX / GLYCOLAX) packet 17 g  17 g Oral Daily PRN Zierle-Ghosh, Asia B, DO      . prochlorperazine (COMPAZINE) tablet 5 mg  5 mg Oral Q6H PRN Nita Sells, MD   5 mg at 12/23/19 1720  . promethazine (PHENERGAN) injection 12.5 mg  12.5 mg Intravenous Q8H Nita Sells, MD   12.5 mg at 12/27/19 0543  . rOPINIRole (REQUIP) tablet 2 mg  2 mg Oral TID Zierle-Ghosh, Asia B, DO   2 mg at 12/27/19 1132  . senna-docusate (Senokot-S) tablet 2 tablet  2 tablet Oral BID Zierle-Ghosh, Asia B, DO   2 tablet at 12/27/19 1132  . sucralfate (CARAFATE) tablet 1 g  1 g Oral TID AC & HS Zierle-Ghosh, Asia B, DO   1 g at 12/27/19 1133  . traMADol (ULTRAM) tablet 100 mg  100 mg Oral Q6H Nita Sells, MD   100 mg at 12/27/19 1132  . vitamin B-12 (CYANOCOBALAMIN) tablet 1,000 mcg  1,000 mcg Oral Daily Eugenie Filler, MD   1,000 mcg at 12/27/19 1132     Discharge Medications: Please see discharge summary for a list of discharge medications.  Relevant Imaging Results:  Relevant Lab Results:   Additional Information ss#[id number]  Raylinn Kosar, Juliann Pulse, RN

## 2019-12-27 NOTE — TOC Progression Note (Signed)
Transition of Care [facility]) - Progression Note    Patient Details  Name: [name] [name] MRN: [id number] Date of Birth: 1935/05/10  Transition of [facility]) CM/SW Contact  Junell Cullifer, Juliann Pulse, RN Phone Number: 12/27/2019, 2:50 PM  Clinical Narrative: Received insurance [place] from [place] for SNF [facility] eff 8/20-8/25 KQAS#[id number], PTAR non emergency [phone].Received pasrr.Awaiting medical stability.       Expected Discharge Plan: [place] Barriers to Discharge: Continued Medical Work up  Expected Discharge Plan and Services Expected Discharge Plan: [place]   Discharge Planning Services: CM Consult Post Acute Care Choice: [place] Living arrangements for the past 2 months: Single Family Home                                       Social Determinants of Health (SDOH) Interventions    Readmission Risk Interventions No flowsheet data found.

## 2019-12-27 NOTE — TOC Progression Note (Signed)
Transition of Care [facility]) - Progression Note    Patient Details  Name: [name] [name] MRN: [id number] Date of Birth: 1935-05-22  Transition [facility] Care [facility]) CM/SW Contact  Zymeir Salminen, Juliann Pulse, RN Phone Number: 12/27/2019, 9:32 AM  Clinical Narrative: Patient/spouse chose [facility](can accept over weekend if auth)-rep [facility] following. Initiated Josem Kaufmann w/[facility] [phone](call for updates on auth over weekend same number). Awaiting Pasrr. Will need COVID.     Expected Discharge Plan: [place] Barriers to Discharge: Continued Medical Work up  Expected Discharge Plan and Services Expected Discharge Plan: [place]   Discharge Planning Services: CM Consult Post Acute Care Choice: [place] Living arrangements for the past 2 months: Single Family Home                                       Social Determinants of Health (SDOH) Interventions    Readmission Risk Interventions No flowsheet data found.

## 2019-12-27 NOTE — Progress Notes (Signed)
PROGRESS NOTE    [name] [name]  EXH:[id number] DOB: 20-May-1935 DOA: 12/20/2019 PCP: [name], MD   Chief Complaint  Patient presents with   Fall   Hip Pain    right hip    Brief Narrative:  26 white female prior TIA reflux CKD 3 anxiety CAD (PTCA 2003) hiatal hernia status post fundoplication 3810 admitted earlier this year for intractable nausea vomiting DM TY 2 CKD 2-3 Chronic low back pain pain syndrome status post kyphoplasty Dr. Nicholaus Bloom On gabapentin Robaxin previously? Accidental fall at home 8/13 resulting in 10/10 pain in right hip-imaging showed pubis ramus fracture with acetabular fractures Previous also recent  fall 7/22 2021 with a trip and a fall and struck her right wrist and landed on her chest and was prescribed prednisone by nurse practitioner in the office for chest pain  Treated with Toradol Zofran in ED and admitted for pain control   Assessment & Plan:   Principal Problem:   Pubic ramus fracture ([place]) Active Problems:   Coronary artery disease involving native heart without angina pectoris   Ileus ([place])   Multiple closed pelvic fractures without disruption of pelvic circle (HCC)   Hyponatremia   Constipation   Impaired glucose tolerance   Hiatal hernia   History of fundoplication   Leukocytosis  1 right-sided pubic ramus fracture Nonoperative per orthopedics.  Secondary to mechanical fall.  Medications being adjusted for better pain control.  Patient noted to have some intolerance to IV morphine for severe pain due to nausea and vomiting.  Patient noted not to be able to take NSAIDs which have been discontinued.  Patient still rates her pain a 8/10 after pain medication, still needing some IV morphine with dose has been decreased.  Patient noted to be declining use of oxycodone early on in the hospitalization however is in agreement for short trial for acute pain control.  Patient unable to take NSAIDs secondary to prior fundoplication.   Continue as needed Flexeril.  Patient also noted to be on Robaxin scheduled which patient seems to be refusing and as such Robaxin has been discontinued.  Patient barely moving pain seems not controlled at this time and is/not ready for discharge.  Patient is in agreement to go to a skilled nursing facility and as such social work consulted.  Continue scheduled Tylenol 3 times daily, Neurontin, Cymbalta, Flexeril as needed.  Continue oxycodone as needed.  Patient seen in consultation by Dr. Lorin Mercy orthopedics who  recommending nonoperative management with outpatient follow-up post discharge in 1 to 2 weeks.  Follow.  2.  Mild ileus Likely secondary to immobility and from pain medications.  Seems to have resolved.  Patient has been placed on laxatives.  Continue Senokot S2 twice daily.  Continue MiraLAX as needed.  3.  Dysphagia Patient with complaints of food getting stuck in the esophagus this morning after eating breakfast.  Patient with history of hiatal hernia status post fundoplication.  Will place patient on clear liquids.  Consult with GI for further evaluation and management.??  Barium esophagram.  4.  Hypertension/coronary artery disease Blood pressure currently controlled on current regimen of Lopressor and Norvasc.  Continue aspirin.   5.  Hyponatremia Likely secondary to tea/toast [place]ia from drinking too much fluid.  Patient fluid restricted to 1500 cc/day.  Sodium levels and currently at 133.  Follow.  6.  Severe constipation Patient received sorbitol with good effects.  Last bowel movement 1 to 2 days ago.  Continue current bowel regimen  of Senokot-S twice daily.  MiraLAX as needed.  Follow.   7.  Hiatial hernia with fundoplication No NSAIDs.  PPI.  Carafate.   8.  Impaired glucose tolerance Outpatient follow-up with PCP.  9.  Prior spinal stenosis status post kyphoplasty in the remote past with chronic pain Continue gabapentin.  Continue Prolia, salmon calcitonin presumably  for osteoporotic fracture in the outpatient setting.  Follow.   10.  Leukocytosis Felt likely steroid-induced.  Steroids discontinued.  Improved.  Follow.  11.  Lower abdominal discomfort Concern for urinary retention.  I and O cath with 800 cc of urine output on 12/26/2019.  Patient now with good urine output.  Lower abdominal discomfort improved.  Follow.   DVT prophylaxis: Heparin Code Status: Full Family Communication: Updated patient.  No family at bedside.  Disposition:   Status is: Inpatient    Dispo:  Patient From: Home  Planned Disposition: [place]  Expected discharge date: 12/26/19  Medically stable for discharge: No.  Pain currently being managed for better control.  Patient also with dysphagia.  GI evaluation pending.        Consultants:   Orthopedics: Dr. Lorin [facility] 12/21/2019  GI pending  Procedures:   CT pelvis 12/20/2019  Abdominal x-ray 12/24/2019  Chest x-ray 12/20/2019  Antimicrobials:   None   Subjective: Patient sitting up in bed.  Patient states feels like food is getting stuck in esophagus when she ate breakfast this morning.  States some improvement with oxycodone in terms of pain management.  Denies chest pain.  No shortness of breath.   Objective: Vitals:   12/26/19 2043 12/26/19 2111 12/27/19 0530 12/27/19 0751  BP:  134/62 132/67   Pulse:  86 76   Resp:  (!) 22 18   Temp:  98.7 F (37.1 C) 98.8 F (37.1 C)   TempSrc:  Oral Oral   SpO2: 93% 92% 93% 95%  Weight:   69.6 kg   Height:        Intake/Output Summary (Last 24 hours) at 12/27/2019 1201 Last data filed at 12/27/2019 0645 Gross per 24 hour  Intake 120 ml  Output 1670 ml  Net -1550 ml   Filed Weights   12/25/19 0508 12/26/19 0521 12/27/19 0530  Weight: 70.8 kg 70.8 kg 69.6 kg    Examination:  General exam: NAD.  Respiratory system: CTAB.  No wheezes, no crackles, no rhonchi.  Normal respiratory effort.   Cardiovascular system: Regular rate rhythm no  murmurs rubs or gallops.  No JVD.  No lower extremity edema.  Gastrointestinal system: Abdomen is nondistended, positive bowel sounds, no rebound, no guarding, nontender to palpation.  Central nervous system: Alert and oriented. No focal neurological deficits. Extremities: Symmetric 5 x 5 power. Skin: No rashes, lesions or ulcers Psychiatry: Judgement and insight appear normal. Mood & affect appropriate.     Data Reviewed: I have personally reviewed following labs and imaging studies  CBC: Recent Labs  Lab 12/22/19 0532 12/23/19 0526 12/24/19 0859 12/25/19 0601 12/26/19 0542  WBC 11.5* 11.0* 10.3 10.3 11.2*  NEUTROABS 9.3* 7.6 6.9 6.4 7.2  HGB 14.7 14.0 14.3 14.7 14.4  HCT 45.2 44.0 43.8 46.0 44.8  MCV 95.4 96.9 96.3 97.0 97.0  PLT 307 293 274 267 671    Basic Metabolic Panel: Recent Labs  Lab 12/21/19 1013 12/21/19 1013 12/22/19 0532 12/23/19 0526 12/24/19 0859 12/25/19 0601 12/26/19 0542 12/26/19 1331 12/27/19 0518  NA 139   < > 133*   < > 128* 130*  128* 132* 133*  K 4.0   < > 4.3   < > 4.1 4.2 3.8 4.3 4.3  CL 105   < > 97*   < > 95* 97* 96* 96* 98  CO2 25   < > 23   < > 23 23 21* 24 25  GLUCOSE 106*   < > 143*   < > 106* 98 101* 101* 117*  BUN 20   < > 13   < > 12 15 21 21 18   CREATININE 0.68   < > 0.63   < > 0.59 0.68 0.87 0.76 0.82  CALCIUM 8.0*   < > 8.3*   < > 8.2* 8.5* 8.6* 8.9 9.2  MG 2.2  --   --   --   --   --   --   --   --   PHOS  --   --  2.4*  --   --   --   --   --   --    < > = values in this interval not displayed.    GFR: Estimated Creatinine Clearance: 50.7 mL/min (by C-G formula based on SCr of 0.82 mg/dL).  Liver Function Tests: Recent Labs  Lab 12/21/19 1013 12/21/19 1013 12/22/19 0532 12/24/19 0859 12/25/19 0601 12/26/19 0542 12/27/19 0518  AST 871*  --   --  112* 33 18 14*  ALT 621*  --   --  199* 145* 95* 64*  ALKPHOS 184*  --   --  239* 224* 173* 151*  BILITOT 3.5*  --   --  1.9* 1.4* 1.0 1.0  PROT 6.4*  --   --  6.3* 6.6  6.7 6.5  ALBUMIN 3.8   < > 3.8 3.5 3.5 3.4* 3.4*   < > = values in this interval not displayed.    CBG: No results for input(s): GLUCAP in the last 168 hours.   Recent Results (from the past 240 hour(s))  SARS Coronavirus 2 by RT PCR (hospital order, performed in [facility] hospital lab) Nasopharyngeal     Status: None   Collection Time: 12/20/19  8:52 PM   Specimen: Nasopharyngeal  Result Value Ref Range Status   SARS Coronavirus 2 NEGATIVE NEGATIVE Final    Comment: (NOTE) SARS-CoV-2 target nucleic acids are NOT DETECTED.  The SARS-CoV-2 RNA is generally detectable in upper and lower respiratory specimens during the acute phase of infection. The lowest concentration of SARS-CoV-2 viral copies this assay can detect is 250 copies / mL. A negative result does not preclude SARS-CoV-2 infection and should not be used as the sole basis for treatment or other patient management decisions.  A negative result may occur with improper specimen collection / handling, submission of specimen other than nasopharyngeal swab, presence of viral mutation(s) within the areas targeted by this assay, and inadequate number of viral copies (<250 copies / mL). A negative result must be combined with clinical observations, patient history, and epidemiological information.  Fact Sheet for Patients:   StrictlyIdeas.no  Fact Sheet for Healthcare Providers: BankingDealers.co.za  This test is not yet approved or  cleared by the [place] FDA and has been authorized for detection and/or diagnosis of SARS-CoV-2 by FDA under an Emergency Use Authorization (EUA).  This EUA will remain in effect (meaning this test can be used) for the duration of the COVID-19 declaration under Section 564(b)(1) of the Act, 21 U.S.C. section 360bbb-3(b)(1), unless the authorization is terminated or revoked sooner.  Performed  at [facility], [place]  [address] [place], [place] [address]          Radiology Studies: No results found.      Scheduled Meds:  acetaminophen  500 mg Oral TID   amLODipine  10 mg Oral Daily   aspirin EC  81 mg Oral QHS   calcitonin (salmon)  1 spray Alternating Nares Daily   cholecalciferol  5,000 Units Oral BID   darifenacin  7.5 mg Oral Daily   DULoxetine  30 mg Oral BID   gabapentin  300 mg Oral QID   heparin  5,000 Units Subcutaneous Q8H   metoprolol tartrate  25 mg Oral QHS   mometasone-formoterol  2 puff Inhalation BID   pantoprazole  40 mg Oral Daily   promethazine  12.5 mg Intravenous Q8H   rOPINIRole  2 mg Oral TID   senna-docusate  2 tablet Oral BID   sucralfate  1 g Oral TID AC & HS   traMADol  100 mg Oral Q6H   vitamin B-12  1,000 mcg Oral Daily   Continuous Infusions:   LOS: 7 days    Time spent: 40 minutes    Irine Seal, MD [facility]   To contact the attending provider between 7A-7P or the covering provider during after hours 7P-7A, please log into the web site [email or web address] and access using universal [place] password for that web site. If you do not have the password, please call the hospital operator.  12/27/2019, 12:01 PM

## 2019-12-27 NOTE — Progress Notes (Signed)
Physical Therapy Treatment Patient Details Name: [name] [name] MRN: [id number] DOB: 1935/09/28 Today's Date: 12/27/2019    History of Present Illness 84 y.o. female admitted with R pubic ramus fx + R ischium/acetabulum fx. Hx of TIA, hypertension, hyperlipidemia, GERD, CKD 3, CAD, anxiety, chronic pain, vertigo, kyphoplasty    PT Comments    Assisted OOB to attempt amb was limited.  General bed mobility comments: Assist for trunk and bil LEs. Increased time. Cues for technique and increased time.  General transfer comment: increased time due to pain level and 25% VC's on safety with turns.  General Gait Details: very limited amb distance due to pain and effort.  Amb from bed to sink only then static standing at sink x 5 min to brush dentures and wash face.  "I have to sit down".  Recliner brought to pt.  Pt will need ST Rehab at SNF prior to safe D/C to home.    Follow Up Recommendations  SNF     Equipment Recommendations  Rolling walker with 5" wheels;3in1 (PT);Wheelchair (measurements PT)    Recommendations for Other Services       Precautions / Restrictions Precautions Precautions: Fall Restrictions Weight Bearing Restrictions: No Other Position/Activity Restrictions: WBAT    Mobility  Bed Mobility Overal bed mobility: Needs Assistance Bed Mobility: Supine to Sit     Supine to sit: Min assist     General bed mobility comments: Assist for trunk and bil LEs. Increased time. Cues for technique and increased time  Transfers Overall transfer level: Needs assistance Equipment used: Rolling walker (2 wheeled) Transfers: Sit to/from [facility] Sit to Stand: Min assist Stand pivot transfers: Min assist       General transfer comment: increased time due to pain level and 25% VC's on safety with turns  Ambulation/Gait Ambulation/Gait assistance: Min assist Gait Distance (Feet): 5 Feet Assistive device: Rolling walker (2 wheeled) Gait  Pattern/deviations: Step-to pattern;Antalgic;Trunk flexed;Decreased stance time - right Gait velocity: decreased   General Gait Details: very limited amb distance due to pain and effort.  Amb from bed to sink only then static standing at sink x 5 min to brush dentures and wash face.  "I have to sit down".  Recliner brought to pt.   Stairs             Wheelchair Mobility    Modified Rankin (Stroke Patients Only)       Balance                                            Cognition Arousal/Alertness: Awake/alert Behavior During Therapy: WFL for tasks assessed/performed Overall Cognitive Status: Within Functional Limits for tasks assessed                                 General Comments: AxO x 3 pleasant but very uncomfortable      Exercises      General Comments        Pertinent Vitals/Pain Pain Assessment: Faces Faces Pain Scale: Hurts even more Pain Location: pelvis Pain Descriptors / Indicators: Discomfort;Grimacing;Guarding;Sharp;Moaning Pain Intervention(s): Monitored during session;Premedicated before session;Repositioned    Home Living                      Prior Function  PT Goals (current goals can now be found in the care plan section) Progress towards PT goals: Progressing toward goals    Frequency    Min 3X/week      PT Plan Current plan remains appropriate    Co-evaluation              AM-PAC PT "6 Clicks" Mobility   Outcome Measure  Help needed turning from your back to your side while in a flat bed without using bedrails?: A Lot Help needed moving from lying on your back to sitting on the side of a flat bed without using bedrails?: A Lot Help needed moving to and from a bed to a chair (including a wheelchair)?: A Lot Help needed standing up from a chair using your arms (e.g., wheelchair or bedside chair)?: A Lot Help needed to walk in hospital room?: A Lot Help needed  climbing 3-5 steps with a railing? : Total 6 Click Score: 11    End of Session Equipment Utilized During Treatment: Gait belt Activity Tolerance: Patient limited by pain;Patient limited by fatigue Patient left: in chair;with call bell/phone within reach;with chair alarm set Nurse Communication: Mobility status PT Visit Diagnosis: Muscle weakness (generalized) (M62.81);Pain;Difficulty in walking, not elsewhere classified (R26.2);History of falling (Z91.81) Pain - Right/Left: Right     Time: 4827-0786 PT Time Calculation (min) (ACUTE ONLY): 28 min  Charges:  $Gait Training: 8-22 mins $Therapeutic Activity: 8-22 mins                     Rica Koyanagi  PTA Acute  Rehabilitation Services Pager      [phone] Office      [phone]

## 2019-12-27 NOTE — Progress Notes (Signed)
Attempted to get pt for her MRI pt stated that she just ate. Will attempt later this evening or tomorrow.

## 2019-12-28 DIAGNOSIS — B370 Candidal stomatitis: Secondary | ICD-10-CM

## 2019-12-28 LAB — CBC WITH DIFFERENTIAL/PLATELET
Abs Immature Granulocytes: 0.15 10*3/uL — ABNORMAL HIGH (ref 0.00–0.07)
Basophils Absolute: 0 10*3/uL (ref 0.0–0.1)
Basophils Relative: 0 %
Eosinophils Absolute: 0.3 10*3/uL (ref 0.0–0.5)
Eosinophils Relative: 3 %
HCT: 42.8 % (ref 36.0–46.0)
Hemoglobin: 14.1 g/dL (ref 12.0–15.0)
Immature Granulocytes: 2 %
Lymphocytes Relative: 15 %
Lymphs Abs: 1.5 10*3/uL (ref 0.7–4.0)
MCH: 31.8 pg (ref 26.0–34.0)
MCHC: 32.9 g/dL (ref 30.0–36.0)
MCV: 96.4 fL (ref 80.0–100.0)
Monocytes Absolute: 1.2 10*3/uL — ABNORMAL HIGH (ref 0.1–1.0)
Monocytes Relative: 12 %
Neutro Abs: 6.8 10*3/uL (ref 1.7–7.7)
Neutrophils Relative %: 68 %
Platelets: 321 10*3/uL (ref 150–400)
RBC: 4.44 MIL/uL (ref 3.87–5.11)
RDW: 14.2 % (ref 11.5–15.5)
WBC: 9.9 10*3/uL (ref 4.0–10.5)
nRBC: 0 % (ref 0.0–0.2)

## 2019-12-28 LAB — BASIC METABOLIC PANEL
Anion gap: 9 (ref 5–15)
BUN: 15 mg/dL (ref 8–23)
CO2: 25 mmol/L (ref 22–32)
Calcium: 8.8 mg/dL — ABNORMAL LOW (ref 8.9–10.3)
Chloride: 100 mmol/L (ref 98–111)
Creatinine, Ser: 0.62 mg/dL (ref 0.44–1.00)
GFR calc Af Amer: >60 mL/min (ref >60)
GFR calc non Af Amer: >60 mL/min (ref >60)
Glucose, Bld: 97 mg/dL (ref 70–99)
Potassium: 4.3 mmol/L (ref 3.5–5.1)
Sodium: 134 mmol/L — ABNORMAL LOW (ref 135–145)

## 2019-12-28 LAB — HEPATIC FUNCTION PANEL
ALT: 46 U/L — ABNORMAL HIGH (ref 0–44)
AST: 13 U/L — ABNORMAL LOW (ref 15–41)
Albumin: 3.3 g/dL — ABNORMAL LOW (ref 3.5–5.0)
Alkaline Phosphatase: 136 U/L — ABNORMAL HIGH (ref 38–126)
Bilirubin, Direct: 0.2 mg/dL (ref 0.0–0.2)
Indirect Bilirubin: 0.3 mg/dL (ref 0.3–0.9)
Total Bilirubin: 0.5 mg/dL (ref 0.3–1.2)
Total Protein: 6.8 g/dL (ref 6.5–8.1)

## 2019-12-28 LAB — GAMMA GT: GGT: 277 U/L — ABNORMAL HIGH (ref 7–50)

## 2019-12-28 MED ORDER — SORBITOL 70 % SOLN
30.0000 mL | Freq: Once | Status: AC
  Administered 2019-12-28: 30 mL via ORAL
  Filled 2019-12-28: qty 30

## 2019-12-28 MED ORDER — POLYETHYLENE GLYCOL 3350 17 G PO PACK
17.0000 g | PACK | Freq: Every day | ORAL | Status: DC
  Administered 2019-12-28: 17 g via ORAL
  Filled 2019-12-28: qty 1

## 2019-12-28 MED ORDER — OXYCODONE HCL 5 MG PO TABS
2.5000 mg | ORAL_TABLET | Freq: Four times a day (QID) | ORAL | Status: DC | PRN
  Administered 2019-12-30: 2.5 mg via ORAL
  Filled 2019-12-28: qty 1

## 2019-12-28 NOTE — Progress Notes (Signed)
[facility] Progress Note  CC:  Dysphagia   Subjective: She is tolerating clear liquids without difficulty. Her lower sternal and epigastric pain significantly improved today. No N/V. No BM. Husband at the bedside.    Objective:   Abdominal MRI with MRCP w/wo contrast 12/28/2019: 1. Mild intrahepatic and moderate extrahepatic biliary ductal dilation is unchanged from prior CT examination possibly representing post cholecystectomy change. No intraluminal filling defect is identified. 2. No acute findings in the abdomen. 3. Moderate hiatal hernia. 4. Trace left pleural effusion.   Vital signs in last 24 hours: Temp:  [98.7 F (37.1 C)-98.9 F (37.2 C)] 98.7 F (37.1 C) (08/21 0512) Pulse Rate:  [73-85] 73 (08/21 0512) Resp:  [16-18] 16 (08/21 0512) BP: (133-139)/(65-66) 134/66 (08/21 0512) SpO2:  [92 %-97 %] 97 % (08/21 0807) Weight:  [69.9 kg] 69.9 kg (08/21 0500) Last BM Date: 12/25/19 (per patient. ) General:   Alert female in NAD. Heart: RRR, no murmur. Pulm: Breath sounds clear throughout. Abdomen: Soft, mild epigastric tenderness without rebound or guarding. No mass. + BX x 4 quads.  Extremities:  Without edema. Neurologic:  Alert and  oriented x4;  grossly normal neurologically. Psych:  Alert and cooperative. Normal mood and affect.  Intake/Output from previous day: 08/20 0701 - 08/21 0700 In: 840 [P.O.:840] Out: 800 [Urine:800] Intake/Output this shift: No intake/output data recorded.  Lab Results: Recent Labs    12/26/19 0542 12/28/19 0611  WBC 11.2* 9.9  HGB 14.4 14.1  HCT 44.8 42.8  PLT 265 321   BMET Recent Labs    12/26/19 1331 12/27/19 0518 12/28/19 0611  NA 132* 133* 134*  K 4.3 4.3 4.3  CL 96* 98 100  CO2 '24 25 25  ' GLUCOSE 101* 117* 97  BUN '21 18 15  ' CREATININE 0.76 0.82 0.62  CALCIUM 8.9 9.2 8.8*   LFT Recent Labs    12/28/19 0611  PROT 6.8  ALBUMIN 3.3*  AST 13*  ALT 46*  ALKPHOS 136*  BILITOT 0.5    BILIDIR 0.2  IBILI 0.3   PT/INR No results for input(s): LABPROT, INR in the last 72 hours. Hepatitis Panel No results for input(s): HEPBSAG, HCVAB, HEPAIGM, HEPBIGM in the last 72 hours.  MR 3D Recon At Scanner  Result Date: 12/27/2019 CLINICAL DATA:  Abdominal pain, biliary obstruction EXAM: MRI ABDOMEN WITHOUT AND WITH CONTRAST (INCLUDING MRCP) TECHNIQUE: Multiplanar multisequence MR imaging of the abdomen was performed both before and after the administration of intravenous contrast. Heavily T2-weighted images of the biliary and pancreatic ducts were obtained, and three-dimensional MRCP images were rendered by post processing. CONTRAST:  103m GADAVIST GADOBUTROL 1 MMOL/ML IV SOLN COMPARISON:  CT abdomen 06/10/2019 FINDINGS: The examination is slightly degraded by motion artifact. Lower chest: Moderate hiatal hernia. Mild cardiomegaly. Trace left pleural effusion. Hepatobiliary: Simple cysts are seen within segment 4 B and segment 3 of the liver. No focal intrahepatic masses are identified. Mild intrahepatic and moderate extrahepatic biliary ductal dilation is unchanged from prior CT examination possibly representing post cholecystectomy change. No intraluminal filling defect is identified. Cholecystectomy has been performed. Pancreas: Imaging is degraded by motion artifact. No intrapancreatic lesions are seen. The pancreatic duct is not dilated. No pancreatic divisum Spleen:  Unremarkable Adrenals/Urinary Tract: The adrenal glands are unremarkable. Simple cortical cyst noted arising exophytically from the interpolar region of the left kidney. The kidneys are otherwise unremarkable. Stomach/Bowel: Unremarkable Vascular/Lymphatic: No pathologic adenopathy within the abdomen. No abdominal aortic aneurysm. Superior mesenteric vein,  splenic vein, and portal vein are patent. Renal veins and suprarenal inferior vena cava are unremarkable. Other: None significant Musculoskeletal: Susceptibility artifact  secondary to lumbar fusion hardware is noted. Multiple compression fractures are seen within the a superior lumbar spine with areas of signal loss likely related to probable prior vertebroplasty. IMPRESSION: 1. Mild intrahepatic and moderate extrahepatic biliary ductal dilation is unchanged from prior CT examination possibly representing post cholecystectomy change. No intraluminal filling defect is identified. 2. No acute findings in the abdomen. 3. Moderate hiatal hernia. 4. Trace left pleural effusion. Electronically Signed   By: Fidela Salisbury MD   On: 12/27/2019 21:33   MR ABDOMEN MRCP W WO CONTAST  Result Date: 12/27/2019 CLINICAL DATA:  Abdominal pain, biliary obstruction EXAM: MRI ABDOMEN WITHOUT AND WITH CONTRAST (INCLUDING MRCP) TECHNIQUE: Multiplanar multisequence MR imaging of the abdomen was performed both before and after the administration of intravenous contrast. Heavily T2-weighted images of the biliary and pancreatic ducts were obtained, and three-dimensional MRCP images were rendered by post processing. CONTRAST:  11m GADAVIST GADOBUTROL 1 MMOL/ML IV SOLN COMPARISON:  CT abdomen 06/10/2019 FINDINGS: The examination is slightly degraded by motion artifact. Lower chest: Moderate hiatal hernia. Mild cardiomegaly. Trace left pleural effusion. Hepatobiliary: Simple cysts are seen within segment 4 B and segment 3 of the liver. No focal intrahepatic masses are identified. Mild intrahepatic and moderate extrahepatic biliary ductal dilation is unchanged from prior CT examination possibly representing post cholecystectomy change. No intraluminal filling defect is identified. Cholecystectomy has been performed. Pancreas: Imaging is degraded by motion artifact. No intrapancreatic lesions are seen. The pancreatic duct is not dilated. No pancreatic divisum Spleen:  Unremarkable Adrenals/Urinary Tract: The adrenal glands are unremarkable. Simple cortical cyst noted arising exophytically from the interpolar  region of the left kidney. The kidneys are otherwise unremarkable. Stomach/Bowel: Unremarkable Vascular/Lymphatic: No pathologic adenopathy within the abdomen. No abdominal aortic aneurysm. Superior mesenteric vein, splenic vein, and portal vein are patent. Renal veins and suprarenal inferior vena cava are unremarkable. Other: None significant Musculoskeletal: Susceptibility artifact secondary to lumbar fusion hardware is noted. Multiple compression fractures are seen within the a superior lumbar spine with areas of signal loss likely related to probable prior vertebroplasty. IMPRESSION: 1. Mild intrahepatic and moderate extrahepatic biliary ductal dilation is unchanged from prior CT examination possibly representing post cholecystectomy change. No intraluminal filling defect is identified. 2. No acute findings in the abdomen. 3. Moderate hiatal hernia. 4. Trace left pleural effusion. Electronically Signed   By: AFidela SalisburyMD   On: 12/27/2019 21:33    Assessment / Plan:  [age]year old female with a history of GERD, tortuous esophagus, candidiasis esophagitis with past [employer] fundoplication with the development of dysphagia. She has a significant amount of oral candidiasis on exam today.  I suspect she has recurrence of esophageal candidiasis.  She was recently prescribed a brief Prednisone taper for a right wrist injury which increases her risk for candidiasis.  -Continue Fluconazole  -Continue Pantoprazole 40 mg p.o. daily -Repeat EGD if no further improvement  -Advance to full liquid diet, if tolerated then soft diet  2.  Lower chest and epigastric pain which developed 2 days ago with elevated alk phos, AST/ALT and total bilirubin levels since admission.  Admission Alk phos 184. AST 871.  ALT 621. T. Bili 3.5. LFTs and total bilirubin levels drifting downward. Elevated GGT 277. Today Alk phos 136. AST 13. ALT 46. T. Bili 0.5.  Abdominal MRI/MRCP 8/20 showed mild intrahepatic and moderate  extrahepatic biliary ductal dilatation (unchanged from a prior CT 06/2019).  No evidence of choledocholithiasis.  It is possible that she had a common bile duct stone which passed.  Past cholecystectomy.  A moderate hiatal hernia and trace left pleural pleural effusion were noted. -Hepatic panel, AMA, ANA and SMA in am -Further recommendations per Dr. Hilarie Fredrickson  3. Pubic ramus fracture  4. History of CAD s/p stent 2003  5. History of DM II  6. History of CKD stage III. Cr 0.82. GFR > 60.  7. History of tubular adenomatous colon polyps. -Next colonoscopy due Jan 2022    Principal Problem:   Pubic ramus fracture [facility]) Active Problems:   Coronary artery disease involving native heart without angina pectoris   Ileus (HCC)   Multiple closed pelvic fractures without disruption of pelvic circle (HCC)   Hyponatremia   Constipation   Impaired glucose tolerance   Hiatal hernia   History of fundoplication   Leukocytosis   Esophageal candidiasis (HCC)   Elevated LFTs     LOS: 8 days   [name] [name]  12/28/2019, 11:19 AM

## 2019-12-28 NOTE — Progress Notes (Signed)
PROGRESS NOTE    [name] [name]  IWL:[id number] DOB: [birth year] DOA: 12/20/2019 PCP: [name], MD   Chief Complaint  Patient presents with  . Fall  . Hip Pain    right hip    Brief Narrative:  86 white female prior TIA reflux CKD 3 anxiety CAD (PTCA 2003) hiatal hernia status post fundoplication 1740 admitted earlier this year for intractable nausea vomiting DM TY 2 CKD 2-3 Chronic low back pain pain syndrome status post kyphoplasty Dr. Nicholaus Bloom On gabapentin Robaxin previously? Accidental fall at home 8/13 resulting in 10/10 pain in right hip-imaging showed pubis ramus fracture with acetabular fractures Previous also recent  fall 7/22 2021 with a trip and a fall and struck her right wrist and landed on her chest and was prescribed prednisone by nurse practitioner in the office for chest pain  Treated with Toradol Zofran in ED and admitted for pain control   Assessment & Plan:   Principal Problem:   Pubic ramus fracture ([place]) Active Problems:   Coronary artery disease involving native heart without angina pectoris   Ileus ([place])   Multiple closed pelvic fractures without disruption of pelvic circle (HCC)   Hyponatremia   Constipation   Impaired glucose tolerance   Hiatal hernia   History of fundoplication   Leukocytosis   Esophageal candidiasis (HCC)   Elevated LFTs  1 right-sided pubic ramus fracture Nonoperative per orthopedics.  Secondary to mechanical fall.  Medications being adjusted for better pain control.  Patient noted to have some intolerance to IV morphine for severe pain due to nausea and vomiting.  Patient noted not to be able to take NSAIDs which have been discontinued.  Patient still rates her pain a 8/10 after pain medication, still needing some IV morphine with dose has been decreased.  Patient noted to be declining use of oxycodone early on in the hospitalization however is in agreement for short trial for acute pain control.  Patient unable  to take NSAIDs secondary to prior fundoplication.  Continue as needed Flexeril.  Patient also noted to be on Robaxin scheduled which patient seems to be refusing and as such Robaxin has been discontinued.  Patient with some improvement with pain control however not ready for discharge yet. Patient is in agreement to go to a skilled nursing facility and as such social work consulted.  Continue scheduled Tylenol 3 times daily, Neurontin, Cymbalta, Flexeril as needed.  Decrease oxycodone dose to 2.5 mg every 6 hours as needed due to concerns for possible mild confusion versus sedation per husband.  Patient seen in consultation by Dr. Lorin [facility]cy orthopedics who  recommending nonoperative management with outpatient follow-up post discharge in 1 to 2 weeks.  Follow.  2.  Mild ileus Likely secondary to immobility and from pain medications.  Seems to have resolved.  Patient has been placed on laxatives.  Continue Senokot S2 twice daily.  Change MiraLAX to daily.   3.  Dysphagia/??  Candida esophagitis/oral candidiasis Patient with complaints of food getting stuck in the esophagus the morning of 12/27/2019.  Patient with history of hiatal hernia status post fundoplication.  Patient was on clear liquid diet.  Patient seen by GI and it was felt per GI patient had oral candidiasis and felt concern for Candida esophagitis and patient subsequently started on fluconazole empirically per GI recommendations.  Per GI if no improvement with dysphagia will need upper endoscopy for further evaluation.  Diet advanced to a full liquid diet per GI recommendations.  Follow.  4.  Hypertension/coronary artery disease Blood pressure currently controlled on current regimen of Lopressor and Norvasc.  Continue aspirin.   5.  Hyponatremia Likely secondary to tea/toast Potomania from drinking too much fluid.  Patient fluid restricted to 1500 cc/day.  Sodium levels and currently at 134.  Follow.  6.  Severe constipation Patient received  sorbitol with good effects.  Last bowel movement 2-3 days ago per patient..  Continue current bowel regimen of Senokot-S twice daily.  Change MiraLAX to daily.  Sorbitol p.o. x1.  Follow.    7.  Hiatial hernia with fundoplication No NSAIDs.  Continue Carafate, PPI.   8.  Impaired glucose tolerance Outpatient follow-up with PCP.  9.  Prior spinal stenosis status post kyphoplasty in the remote past with chronic pain Continue gabapentin.  Continue Prolia, salmon calcitonin presumably for osteoporotic fracture in the outpatient setting.  Follow.   10.  Leukocytosis Felt likely steroid-induced.  Steroids discontinued.  Improved.  Follow.  11.  Lower abdominal discomfort Concern for urinary retention.  I and O cath with 800 cc of urine output on 12/26/2019.  Patient with good urine output however not properly recorded.  I and O cath as needed.  12.  Transaminitis Concern for past common bile duct stone.  Patient noted to have elevated GGT.  Patient on admission noted to have a significantly elevated LFTs.  MRCP done 12/27/2019 with mild intrahepatic and moderate extrahepatic biliary ductal dilatation unchanged from prior CT of 06/2019.  No evidence of choledocholithiasis.  GI has ordered hepatic panel, AMA, ANA, SMA in the a.m.  Per GI.   DVT prophylaxis: Heparin Code Status: Full Family Communication: Updated patient and husband at bedside. Disposition:   Status is: Inpatient    Dispo:  Patient From: Home  Planned Disposition: [facility]  Expected discharge date: 12/26/19  Medically stable for discharge: No.  Pain currently being managed for better control.  Patient also with dysphagia.  GI following.          Consultants:   Orthopedics: Dr. Lorin Mercy 12/21/2019  GI: Dr. Hilarie Fredrickson  Procedures:   CT pelvis 12/20/2019  Abdominal x-ray 12/24/2019  Chest x-ray 12/20/2019  MRCP 12/27/2019  Antimicrobials:   None   Subjective: Patient sitting up in bed.  Husband at  bedside.  States some improvement with swallowing.  Denies any chest pain.  No shortness of breath.  Denies any significant abdominal discomfort.  Notes some control of pain with oxycodone however has been concerned it may be making patient a little bit confused/drowsy.  Patient states no bowel movement in approximately 3 days.  Objective: Vitals:   12/27/19 2129 12/28/19 0500 12/28/19 0512 12/28/19 0807  BP: 133/65  134/66   Pulse: 85  73   Resp: 18  16   Temp: 98.9 F (37.2 C)  98.7 F (37.1 C)   TempSrc: Oral  Oral   SpO2: 92%  95% 97%  Weight:  69.9 kg    Height:        Intake/Output Summary (Last 24 hours) at 12/28/2019 1214 Last data filed at 12/28/2019 0630 Gross per 24 hour  Intake 600 ml  Output 800 ml  Net -200 ml   Filed Weights   12/26/19 0521 12/27/19 0530 12/28/19 0500  Weight: 70.8 kg 69.6 kg 69.9 kg    Examination:  General exam: NAD.  Respiratory system: Lungs clear to auscultation bilaterally.  No wheezes, no crackles, no rhonchi.  Normal respiratory effort.  Cardiovascular system: RRR no murmurs rubs  or gallops.  No JVD.  No lower extremity edema.   Gastrointestinal system: Abdomen is soft, nontender, nondistended, positive bowel sounds.  No rebound.  No guarding.   Central nervous system: Alert and oriented. No focal neurological deficits. Extremities: Symmetric 5 x 5 power. Skin: No rashes, lesions or ulcers Psychiatry: Judgement and insight appear normal. Mood & affect appropriate.     Data Reviewed: I have personally reviewed following labs and imaging studies  CBC: Recent Labs  Lab 12/23/19 0526 12/24/19 0859 12/25/19 0601 12/26/19 0542 12/28/19 0611  WBC 11.0* 10.3 10.3 11.2* 9.9  NEUTROABS 7.6 6.9 6.4 7.2 6.8  HGB 14.0 14.3 14.7 14.4 14.1  HCT 44.0 43.8 46.0 44.8 42.8  MCV 96.9 96.3 97.0 97.0 96.4  PLT 293 274 267 265 009    Basic Metabolic Panel: Recent Labs  Lab 12/22/19 0532 12/23/19 0526 12/25/19 0601 12/26/19 0542  12/26/19 1331 12/27/19 0518 12/28/19 0611  NA 133*   < > 130* 128* 132* 133* 134*  K 4.3   < > 4.2 3.8 4.3 4.3 4.3  CL 97*   < > 97* 96* 96* 98 100  CO2 23   < > 23 21* 24 25 25   GLUCOSE 143*   < > 98 101* 101* 117* 97  BUN 13   < > 15 21 21 18 15   CREATININE 0.63   < > 0.68 0.87 0.76 0.82 0.62  CALCIUM 8.3*   < > 8.5* 8.6* 8.9 9.2 8.8*  PHOS 2.4*  --   --   --   --   --   --    < > = values in this interval not displayed.    GFR: Estimated Creatinine Clearance: 52 mL/min (by C-G formula based on SCr of 0.62 mg/dL).  Liver Function Tests: Recent Labs  Lab 12/24/19 0859 12/25/19 0601 12/26/19 0542 12/27/19 0518 12/28/19 0611  AST 112* 33 18 14* 13*  ALT 199* 145* 95* 64* 46*  ALKPHOS 239* 224* 173* 151* 136*  BILITOT 1.9* 1.4* 1.0 1.0 0.5  PROT 6.3* 6.6 6.7 6.5 6.8  ALBUMIN 3.5 3.5 3.4* 3.4* 3.3*    CBG: No results for input(s): GLUCAP in the last 168 hours.   Recent Results (from the past 240 hour(s))  SARS Coronavirus 2 by RT PCR (hospital order, performed in [facility] hospital lab) Nasopharyngeal     Status: None   Collection Time: 12/20/19  8:52 PM   Specimen: Nasopharyngeal  Result Value Ref Range Status   SARS Coronavirus 2 NEGATIVE NEGATIVE Final    Comment: (NOTE) SARS-CoV-2 target nucleic acids are NOT DETECTED.  The SARS-CoV-2 RNA is generally detectable in upper and lower respiratory specimens during the acute phase of infection. The lowest concentration of SARS-CoV-2 viral copies this assay can detect is 250 copies / mL. A negative result does not preclude SARS-CoV-2 infection and should not be used as the sole basis for treatment or other patient management decisions.  A negative result may occur with improper specimen collection / handling, submission of specimen other than nasopharyngeal swab, presence of viral mutation(s) within the areas targeted by this assay, and inadequate number of viral copies (<250 copies / mL). A negative result must  be combined with clinical observations, patient history, and epidemiological information.  Fact Sheet for Patients:   StrictlyIdeas.no  Fact Sheet for Healthcare Providers: BankingDealers.co.za  This test is not yet approved or  cleared by the [place] FDA and has been authorized for detection and/or  diagnosis of SARS-CoV-2 by FDA under an Emergency Use Authorization (EUA).  This EUA will remain in effect (meaning this test can be used) for the duration of the COVID-19 declaration under Section 564(b)(1) of the Act, 21 U.S.C. section 360bbb-3(b)(1), unless the authorization is terminated or revoked sooner.  Performed at [facility], [place] [address], [place], [place] [address]          Radiology Studies: MR 3D Recon At Scanner  Result Date: 12/27/2019 CLINICAL DATA:  Abdominal pain, biliary obstruction EXAM: MRI ABDOMEN WITHOUT AND WITH CONTRAST (INCLUDING MRCP) TECHNIQUE: Multiplanar multisequence MR imaging of the abdomen was performed both before and after the administration of intravenous contrast. Heavily T2-weighted images of the biliary and pancreatic ducts were obtained, and three-dimensional MRCP images were rendered by post processing. CONTRAST:  51mL GADAVIST GADOBUTROL 1 MMOL/ML IV SOLN COMPARISON:  CT abdomen 06/10/2019 FINDINGS: The examination is slightly degraded by motion artifact. Lower chest: Moderate hiatal hernia. Mild cardiomegaly. Trace left pleural effusion. Hepatobiliary: Simple cysts are seen within segment 4 B and segment 3 of the liver. No focal intrahepatic masses are identified. Mild intrahepatic and moderate extrahepatic biliary ductal dilation is unchanged from prior CT examination possibly representing post cholecystectomy change. No intraluminal filling defect is identified. Cholecystectomy has been performed. Pancreas: Imaging is degraded by motion artifact. No intrapancreatic lesions  are seen. The pancreatic duct is not dilated. No pancreatic divisum Spleen:  Unremarkable Adrenals/Urinary Tract: The adrenal glands are unremarkable. Simple cortical cyst noted arising exophytically from the interpolar region of the left kidney. The kidneys are otherwise unremarkable. Stomach/Bowel: Unremarkable Vascular/Lymphatic: No pathologic adenopathy within the abdomen. No abdominal aortic aneurysm. Superior mesenteric vein, splenic vein, and portal vein are patent. Renal veins and suprarenal inferior vena cava are unremarkable. Other: None significant Musculoskeletal: Susceptibility artifact secondary to lumbar fusion hardware is noted. Multiple compression fractures are seen within the a superior lumbar spine with areas of signal loss likely related to probable prior vertebroplasty. IMPRESSION: 1. Mild intrahepatic and moderate extrahepatic biliary ductal dilation is unchanged from prior CT examination possibly representing post cholecystectomy change. No intraluminal filling defect is identified. 2. No acute findings in the abdomen. 3. Moderate hiatal hernia. 4. Trace left pleural effusion. Electronically Signed   By: Fidela Salisbury MD   On: 12/27/2019 21:33   MR ABDOMEN MRCP W WO CONTAST  Result Date: 12/27/2019 CLINICAL DATA:  Abdominal pain, biliary obstruction EXAM: MRI ABDOMEN WITHOUT AND WITH CONTRAST (INCLUDING MRCP) TECHNIQUE: Multiplanar multisequence MR imaging of the abdomen was performed both before and after the administration of intravenous contrast. Heavily T2-weighted images of the biliary and pancreatic ducts were obtained, and three-dimensional MRCP images were rendered by post processing. CONTRAST:  39mL GADAVIST GADOBUTROL 1 MMOL/ML IV SOLN COMPARISON:  CT abdomen 06/10/2019 FINDINGS: The examination is slightly degraded by motion artifact. Lower chest: Moderate hiatal hernia. Mild cardiomegaly. Trace left pleural effusion. Hepatobiliary: Simple cysts are seen within segment 4 B and  segment 3 of the liver. No focal intrahepatic masses are identified. Mild intrahepatic and moderate extrahepatic biliary ductal dilation is unchanged from prior CT examination possibly representing post cholecystectomy change. No intraluminal filling defect is identified. Cholecystectomy has been performed. Pancreas: Imaging is degraded by motion artifact. No intrapancreatic lesions are seen. The pancreatic duct is not dilated. No pancreatic divisum Spleen:  Unremarkable Adrenals/Urinary Tract: The adrenal glands are unremarkable. Simple cortical cyst noted arising exophytically from the interpolar region of the left kidney. The kidneys are otherwise unremarkable. Stomach/Bowel:  Unremarkable Vascular/Lymphatic: No pathologic adenopathy within the abdomen. No abdominal aortic aneurysm. Superior mesenteric vein, splenic vein, and portal vein are patent. Renal veins and suprarenal inferior vena cava are unremarkable. Other: None significant Musculoskeletal: Susceptibility artifact secondary to lumbar fusion hardware is noted. Multiple compression fractures are seen within the a superior lumbar spine with areas of signal loss likely related to probable prior vertebroplasty. IMPRESSION: 1. Mild intrahepatic and moderate extrahepatic biliary ductal dilation is unchanged from prior CT examination possibly representing post cholecystectomy change. No intraluminal filling defect is identified. 2. No acute findings in the abdomen. 3. Moderate hiatal hernia. 4. Trace left pleural effusion. Electronically Signed   By: Fidela Salisbury MD   On: 12/27/2019 21:33        Scheduled Meds: . acetaminophen  500 mg Oral TID  . amLODipine  10 mg Oral Daily  . aspirin EC  81 mg Oral QHS  . calcitonin (salmon)  1 spray Alternating Nares Daily  . cholecalciferol  5,000 Units Oral BID  . darifenacin  7.5 mg Oral Daily  . DULoxetine  30 mg Oral BID  . fluconazole  200 mg Oral Daily  . gabapentin  300 mg Oral QID  . heparin   5,000 Units Subcutaneous Q8H  . metoprolol tartrate  25 mg Oral QHS  . mometasone-formoterol  2 puff Inhalation BID  . pantoprazole  40 mg Oral Daily  . polyethylene glycol  17 g Oral Daily  . promethazine  12.5 mg Intravenous Q8H  . rOPINIRole  2 mg Oral TID  . senna-docusate  2 tablet Oral BID  . sucralfate  1 g Oral TID AC & HS  . traMADol  100 mg Oral Q6H  . vitamin B-12  1,000 mcg Oral Daily   Continuous Infusions:   LOS: 8 days    Time spent: 40 minutes    Irine Seal, MD [facility]   To contact the attending provider between 7A-7P or the covering provider during after hours 7P-7A, please log into the web site [email or web address] and access using universal [place] password for that web site. If you do not have the password, please call the hospital operator.  12/28/2019, 12:14 PM

## 2019-12-29 DIAGNOSIS — S32591D Other specified fracture of right pubis, subsequent encounter for fracture with routine healing: Secondary | ICD-10-CM

## 2019-12-29 LAB — COMPREHENSIVE METABOLIC PANEL
ALT: 36 U/L (ref 0–44)
AST: 13 U/L — ABNORMAL LOW (ref 15–41)
Albumin: 3.2 g/dL — ABNORMAL LOW (ref 3.5–5.0)
Alkaline Phosphatase: 123 U/L (ref 38–126)
Anion gap: 9 (ref 5–15)
BUN: 13 mg/dL (ref 8–23)
CO2: 25 mmol/L (ref 22–32)
Calcium: 8.9 mg/dL (ref 8.9–10.3)
Chloride: 98 mmol/L (ref 98–111)
Creatinine, Ser: 0.64 mg/dL (ref 0.44–1.00)
GFR calc Af Amer: >60 mL/min (ref >60)
GFR calc non Af Amer: >60 mL/min (ref >60)
Glucose, Bld: 105 mg/dL — ABNORMAL HIGH (ref 70–99)
Potassium: 4.5 mmol/L (ref 3.5–5.1)
Sodium: 132 mmol/L — ABNORMAL LOW (ref 135–145)
Total Bilirubin: 0.7 mg/dL (ref 0.3–1.2)
Total Protein: 6.5 g/dL (ref 6.5–8.1)

## 2019-12-29 LAB — CBC
HCT: 41.7 % (ref 36.0–46.0)
Hemoglobin: 13.4 g/dL (ref 12.0–15.0)
MCH: 31.3 pg (ref 26.0–34.0)
MCHC: 32.1 g/dL (ref 30.0–36.0)
MCV: 97.4 fL (ref 80.0–100.0)
Platelets: 318 10*3/uL (ref 150–400)
RBC: 4.28 MIL/uL (ref 3.87–5.11)
RDW: 14.3 % (ref 11.5–15.5)
WBC: 7.4 10*3/uL (ref 4.0–10.5)
nRBC: 0 % (ref 0.0–0.2)

## 2019-12-29 LAB — HEMOGLOBIN AND HEMATOCRIT, BLOOD
HCT: 42.6 % (ref 36.0–46.0)
Hemoglobin: 13.9 g/dL (ref 12.0–15.0)

## 2019-12-29 MED ORDER — POLYETHYLENE GLYCOL 3350 17 G PO PACK
17.0000 g | PACK | Freq: Two times a day (BID) | ORAL | Status: DC
  Administered 2019-12-29 – 2019-12-31 (×6): 17 g via ORAL
  Filled 2019-12-29 (×6): qty 1

## 2019-12-29 MED ORDER — BISACODYL 10 MG RE SUPP
10.0000 mg | Freq: Once | RECTAL | Status: AC
  Administered 2019-12-29: 10 mg via RECTAL
  Filled 2019-12-29: qty 1

## 2019-12-29 NOTE — Progress Notes (Addendum)
     Subjective:   Right hip pain, using walker for ambulation short distance.  Results of MRI right hip show a right acetabular insufficiency fracture without displacement, Shows mainly as an area of edema. No surgery necessary. Should remain reduced weight bearing status for 4-6 weeks then gradual progressive increasing weight bearing.    Patient reports pain as moderate.    Objective:   VITALS:  Temp:  [98 F (36.7 C)-99.2 F (37.3 C)] 99.2 F (37.3 C) (08/22 1207) Pulse Rate:  [69-79] 73 (08/22 1207) Resp:  [14-22] 14 (08/22 0523) BP: (112-148)/(65-68) 112/65 (08/22 1207) SpO2:  [92 %-95 %] 95 % (08/22 1207) Weight:  [70.9 kg] 70.9 kg (08/22 0500)  Neurologically intact ABD soft Neurovascular intact Sensation intact distally Intact pulses distally Dorsiflexion/Plantar flexion intact   LABS Recent Labs    12/28/19 0611 12/29/19 0524  HGB 14.1 13.4  WBC 9.9 7.4  PLT 321 318   Recent Labs    12/28/19 0611 12/29/19 0524  NA 134* 132*  K 4.3 4.5  CL 100 98  CO2 25 25  BUN 15 13  CREATININE 0.62 0.64  GLUCOSE 97 105*   No results for input(s): LABPT, INR in the last 72 hours.   Assessment/Plan:   Insufficiency fracture right acetabular pelvis, nondisplaced.   Advance diet Up with therapy Discharge to SNF She is stable for continue rehabilitation and convalescence at the SNF.  [name] 12/29/2019, 12:13 PMPatient ID: [name] [name], female   DOB: October 31, 1935, 84 y.o.   MRN: [id number]

## 2019-12-29 NOTE — Plan of Care (Signed)
?  Problem: Elimination: ?Goal: Will not experience complications related to urinary retention ?Outcome: Progressing ?  ?

## 2019-12-29 NOTE — Progress Notes (Addendum)
Resumed care for this pt at 0300. I agree with previous RN's assessment. Pt resting in bed at this time.

## 2019-12-29 NOTE — Discharge Instructions (Signed)
Hip Stress Fracture  A hip stress fracture is one or more tiny cracks in the hip bone. This type of fracture happens because of repeated stress on the hip bone. Most hip stress fractures happen in the femoral neck. The femoral neck is the area of the upper leg bone (femur) that connects the ball of the femur to the rest of the femur. The ball of the femur fits into the socket of the pelvis to form the hip joint. A stress fracture may happen on the bottom side of the femoral neck (compression fracture) or on the top side of the femoral neck (tension fracture). In some cases, a hip stress fracture may get worse over time and may cause a complete break in the hip bone. What are the causes? This condition is caused by:  Repeated stress and impact that weakens the hip bone over time.  Increasing the level of physical activity too quickly. What increases the risk? You are more likely to develop this condition if:  You are a woman, especially if you have any of the following conditions: ? An eating disorder. ? Absent menstrual periods (amenorrhea) caused by intense physical training. ? Osteoporosis. This is thinning and loss of density in the bones. You are also more likely to develop this condition if you:  Are involved in intense physical training.  Often do athletic activities on hard surfaces, such as dancing or running. What are the signs or symptoms? The most common symptom of this condition is deep, aching pain in the front of the groin.  The pain often gets worse with activity, and it may go away when you rest.  Pain may get worse over time. Other symptoms of a hip stress fracture may include:  Stiffness of the hip.  Limping.  Pain in the hip or thigh.  Inability to use the leg to support any body weight (bear weight) without pain. How is this diagnosed? This condition is diagnosed based on a physical exam and your medical history. During your physical exam, your health care  provider will check your hip joint for pain and loss of motion. You may also have tests, including:  X-rays.  CT scan.  MRI.  Bone scans. How is this treated? Treatment for this condition depends on the severity and location of your fracture. Treatment may include:  Using crutches until your health care provider says that you can bear weight on your hip.  Medicines that help to reduce pain and swelling.  Taking X-rays to make sure that your fracture is not getting worse.  Physical therapy.  Surgery to repair the fracture (open reduction). This may be necessary if: ? You have a tension fracture. ? Other treatment methods have not worked. ? Your stress fracture gets worse. Follow these instructions at home: Activity  Rest your injured hip for as long as told by your health care provider.  Return to your normal activities as told by your health care provider. Ask your health care provider what activities are safe for you.  Avoid any activities that cause pain or irritation in your hip.  If physical therapy was prescribed, do exercises as told by your health care provider. Safety  Do not use your injured leg to support your body weight until your health care provider says that you can.  If you were given crutches, use them as told by your health care provider. Managing pain, stiffness, and swelling   If directed, put ice on the injured area: ?  Put ice in a plastic bag. ? Place a towel between your skin and the bag. ? Leave the ice on for 20 minutes, 2-3 times a day. General instructions  Take over-the-counter and prescription medicines only as told by your health care provider.  Do not use any products that contain nicotine or tobacco, such as cigarettes, e-cigarettes, and chewing tobacco. These can delay bone healing. If you need help quitting, ask your health care provider.  If you have an underlying condition that caused your hip stress fracture, work with your  health care provider to manage your condition.  Keep all follow-up visits as told by your health care provider. This is important. How is this prevented?  Warm up and stretch before being active.  Cool down and stretch after being active.  Always begin new exercises or training gradually.  Give your body time to rest between periods of activity.  Make sure to use equipment that fits you. Wear athletic shoes that have enough support and padding.  Avoid activities that put stress on your hip, such as repetitive movements.  Make sure you get enough calcium and vitamin D. Contact a health care provider if:  Your pain gets worse or it does not get better with treatment. Get help right away if:  Your pain suddenly gets worse.  You cannot move your hip. Summary  A hip stress fracture is one or more tiny cracks in the hip bone.  It is caused by repeated stress and impact that weakens the hip bone over time.  The most common symptom of this condition is deep, aching pain in the front of the groin.  Treatment for hip stress fracture depends on the severity and location of your fracture. This information is not intended to replace advice given to you by your health care provider. Make sure you discuss any questions you have with your health care provider. Document Revised: 10/09/2017 Document Reviewed: 10/09/2017 Elsevier Patient Education  2020 [employer].  The right hip insufficiency fracture involve the socket and no the head or neck of the right hip. You should only touch the right leg to the floor for Balance and avoid weight bearing for 4 weeks until sufficient healing has occurred so that increasing weight bearing status can occur.

## 2019-12-29 NOTE — Progress Notes (Signed)
PROGRESS NOTE    [name] [name]  MEQ:[id number] DOB: March 02, 1936 DOA: 12/20/2019 PCP: [name], MD   Chief Complaint  Patient presents with  . Fall  . Hip Pain    right hip    Brief Narrative:  64 white female prior TIA reflux CKD 3 anxiety CAD (PTCA 2003) hiatal hernia status post fundoplication 2979 admitted earlier this year for intractable nausea vomiting DM TY 2 CKD 2-3 Chronic low back pain pain syndrome status post kyphoplasty Dr. Nicholaus Bloom On gabapentin Robaxin previously? Accidental fall at home 8/13 resulting in 10/10 pain in right hip-imaging showed pubis ramus fracture with acetabular fractures Previous also recent  fall 7/22 2021 with a trip and a fall and struck her right wrist and landed on her chest and was prescribed prednisone by nurse practitioner in the office for chest pain  Treated with Toradol Zofran in ED and admitted for pain control   Assessment & Plan:   Principal Problem:   Pubic ramus fracture ([place]) Active Problems:   Coronary artery disease involving native heart without angina pectoris   Ileus ([place])   Multiple closed pelvic fractures without disruption of pelvic circle (HCC)   Hyponatremia   Constipation   Impaired glucose tolerance   Hiatal hernia   History of fundoplication   Leukocytosis   Esophageal candidiasis (HCC)   Elevated LFTs  1 right-sided pubic ramus fracture Nonoperative per orthopedics.  Secondary to mechanical fall.  Medications being adjusted for better pain control.  Patient noted to have some intolerance to IV morphine for severe pain due to nausea and vomiting.  Patient noted not to be able to take NSAIDs which have been discontinued.  Patient still rates her pain a 8/10 after pain medication, still needing some IV morphine with dose has been decreased.  Patient noted to be declining use of oxycodone early on in the hospitalization however is in agreement for short trial for acute pain control.  Patient unable  to take NSAIDs secondary to prior fundoplication.  Continue as needed Flexeril.  Patient also noted to be on Robaxin scheduled which patient seems to be refusing and as such Robaxin has been discontinued.  Patient with some improvement with pain control however not ready for discharge yet. Patient is in agreement to go to a skilled nursing facility and as such social work consulted.  Continue scheduled Tylenol 3 times daily, Neurontin, Cymbalta, Flexeril as needed.  Decrease oxycodone dose to 2.5 mg every 6 hours as needed due to concerns for possible mild confusion versus sedation per husband.  Patient seen in consultation by Dr. Lorin Mercy orthopedics who  recommending nonoperative management with outpatient follow-up post discharge in 1 to 2 weeks.  Follow.  2.  Mild ileus Likely secondary to immobility and from pain medications.  Seems to have resolved.  Patient has been placed on laxatives.  Continue Senokot S2 twice daily.  Change MiraLAX to twice daily..   3.  Dysphagia/??  Candida esophagitis/oral candidiasis Patient with complaints of food getting stuck in the esophagus the morning of 12/27/2019.  Patient with history of hiatal hernia status post fundoplication.  Patient was on clear liquid diet.  Patient seen by GI and it was felt per GI patient had oral candidiasis and felt concern for Candida esophagitis and patient subsequently started on fluconazole empirically per GI recommendations.  Patient improving clinically.  Denies any nausea, no significant abdominal pain.  Advance diet to a soft diet.  Per GI.  Supportive care.   4.  Hypertension/coronary artery disease Continue Norvasc, Lopressor.  Aspirin.    5.  Hyponatremia Likely secondary to tea/toast Pot[place]ia from drinking too much fluid.  Patient fluid restricted to 1500 cc/day.  Sodium levels and currently at 132.  Follow.  6.  Severe constipation Patient received sorbitol with good effects early on in the hospitalization.  Patient with no  bowel movement yesterday despite changing MiraLAX to daily.  Per RN patient with small to medium bowel movement today.  Increase MiraLAX to twice daily.  Continue Senokot-S twice daily.  Dulcolax suppository x1.  Follow.   7.  Hiatial hernia with fundoplication No NSAIDs.  Continue Carafate, PPI.   8.  Impaired glucose tolerance Outpatient follow-up with PCP.  9.  Prior spinal stenosis status post kyphoplasty in the remote past with chronic pain Continue gabapentin.  Continue Prolia, salmon calcitonin presumably for osteoporotic fracture in the outpatient setting.  Follow.   10.  Leukocytosis Felt likely steroid-induced.  Steroids discontinued.  Improved.  Follow.  11.  Lower abdominal discomfort Concern for urinary retention.  I and O cath with 800 cc of urine output on 12/26/2019.  Patient with urine output per RN.  I and O cath as needed.  12.  Transaminitis Concern for past common bile duct stone.  Patient noted to have elevated GGT.  Patient on admission noted to have a significantly elevated LFTs.  MRCP done 12/27/2019 with mild intrahepatic and moderate extrahepatic biliary ductal dilatation unchanged from prior CT of 06/2019.  No evidence of choledocholithiasis.  GI has ordered hepatic panel which shows improvement with LFTs.  ANA, anti-smooth muscle antibody, antimitochondrial antibodies pending.  GI was following.   DVT prophylaxis: Heparin Code Status: Full Family Communication: Updated patient and husband at bedside. Disposition:   Status is: Inpatient    Dispo:  Patient From: Home  Planned Disposition: [facility]  Expected discharge date: 12/30/2019  Medically stable for discharge: Pain being currently managed.  Dysphagia improving.  GI following.         Consultants:   Orthopedics: Dr. Lorin Mercy 12/21/2019  GI: Dr. Hilarie Fredrickson  Procedures:   CT pelvis 12/20/2019  Abdominal x-ray 12/24/2019  Chest x-ray 12/20/2019  MRCP 12/27/2019  Antimicrobials:    None   Subjective: Patient sitting up in bed.  Denies any chest pain.  No shortness of breath.  Abdominal pain improved.  Swallowing improved.  Tolerating full liquid diet.  Asking about diet advancement.  Patient noted to have had a bowel movement today which was small to medium.  Pain under better control today.  Husband at bedside.   Objective: Vitals:   12/28/19 2025 12/29/19 0500 12/29/19 0523 12/29/19 0852  BP: 128/67  126/67   Pulse: 79  69   Resp: (!) 22  14   Temp: 98.5 F (36.9 C)  98 F (36.7 C)   TempSrc: Oral  Oral   SpO2: 92%  93% 95%  Weight:  70.9 kg    Height:        Intake/Output Summary (Last 24 hours) at 12/29/2019 1052 Last data filed at 12/29/2019 0557 Gross per 24 hour  Intake 240 ml  Output 300 ml  Net -60 ml   Filed Weights   12/27/19 0530 12/28/19 0500 12/29/19 0500  Weight: 69.6 kg 69.9 kg 70.9 kg    Examination:  General exam: NAD.  Respiratory system: CTAB.  No wheezes, no crackles, no rhonchi.  Normal respiratory effort.  Speaking in full sentences.  Cardiovascular system: Regular rate rhythm no  murmurs rubs or gallops.  No JVD.  No lower extremity edema. Gastrointestinal system: Abdomen is soft, nontender, nondistended, positive bowel sounds.  No rebound.  No guarding. Central nervous system: Alert and oriented. No focal neurological deficits. Extremities: Symmetric 5 x 5 power. Skin: No rashes, lesions or ulcers Psychiatry: Judgement and insight appear normal. Mood & affect appropriate.     Data Reviewed: I have personally reviewed following labs and imaging studies  CBC: Recent Labs  Lab 12/23/19 0526 12/23/19 0526 12/24/19 0859 12/25/19 0601 12/26/19 0542 12/28/19 0611 12/29/19 0524  WBC 11.0*   < > 10.3 10.3 11.2* 9.9 7.4  NEUTROABS 7.6  --  6.9 6.4 7.2 6.8  --   HGB 14.0   < > 14.3 14.7 14.4 14.1 13.4  HCT 44.0   < > 43.8 46.0 44.8 42.8 41.7  MCV 96.9   < > 96.3 97.0 97.0 96.4 97.4  PLT 293   < > 274 267 265 321 318    < > = values in this interval not displayed.    Basic Metabolic Panel: Recent Labs  Lab 12/26/19 0542 12/26/19 1331 12/27/19 0518 12/28/19 0611 12/29/19 0524  NA 128* 132* 133* 134* 132*  K 3.8 4.3 4.3 4.3 4.5  CL 96* 96* 98 100 98  CO2 21* 24 25 25 25   GLUCOSE 101* 101* 117* 97 105*  BUN 21 21 18 15 13   CREATININE 0.87 0.76 0.82 0.62 0.64  CALCIUM 8.6* 8.9 9.2 8.8* 8.9    GFR: Estimated Creatinine Clearance: 52.4 mL/min (by C-G formula based on SCr of 0.64 mg/dL).  Liver Function Tests: Recent Labs  Lab 12/25/19 0601 12/26/19 0542 12/27/19 0518 12/28/19 0611 12/29/19 0524  AST 33 18 14* 13* 13*  ALT 145* 95* 64* 46* 36  ALKPHOS 224* 173* 151* 136* 123  BILITOT 1.4* 1.0 1.0 0.5 0.7  PROT 6.6 6.7 6.5 6.8 6.5  ALBUMIN 3.5 3.4* 3.4* 3.3* 3.2*    CBG: No results for input(s): GLUCAP in the last 168 hours.   Recent Results (from the past 240 hour(s))  SARS Coronavirus 2 by RT PCR (hospital order, performed in [facility] hospital lab) Nasopharyngeal     Status: None   Collection Time: 12/20/19  8:52 PM   Specimen: Nasopharyngeal  Result Value Ref Range Status   SARS Coronavirus 2 NEGATIVE NEGATIVE Final    Comment: (NOTE) SARS-CoV-2 target nucleic acids are NOT DETECTED.  The SARS-CoV-2 RNA is generally detectable in upper and lower respiratory specimens during the acute phase of infection. The lowest concentration of SARS-CoV-2 viral copies this assay can detect is 250 copies / mL. A negative result does not preclude SARS-CoV-2 infection and should not be used as the sole basis for treatment or other patient management decisions.  A negative result may occur with improper specimen collection / handling, submission of specimen other than nasopharyngeal swab, presence of viral mutation(s) within the areas targeted by this assay, and inadequate number of viral copies (<250 copies / mL). A negative result must be combined with clinical observations, patient  history, and epidemiological information.  Fact Sheet for Patients:   StrictlyIdeas.no  Fact Sheet for Healthcare Providers: BankingDealers.co.za  This test is not yet approved or  cleared by the [place] FDA and has been authorized for detection and/or diagnosis of SARS-CoV-2 by FDA under an Emergency Use Authorization (EUA).  This EUA will remain in effect (meaning this test can be used) for the duration of the COVID-19 declaration under  Section 564(b)(1) of the Act, 21 U.S.C. section 360bbb-3(b)(1), unless the authorization is terminated or revoked sooner.  Performed at [facility], [place] [address] [place], [place] [address]          Radiology Studies: MR 3D Recon At Scanner  Result Date: 12/27/2019 CLINICAL DATA:  Abdominal pain, biliary obstruction EXAM: MRI ABDOMEN WITHOUT AND WITH CONTRAST (INCLUDING MRCP) TECHNIQUE: Multiplanar multisequence MR imaging of the abdomen was performed both before and after the administration of intravenous contrast. Heavily T2-weighted images of the biliary and pancreatic ducts were obtained, and three-dimensional MRCP images were rendered by post processing. CONTRAST:  15mL GADAVIST GADOBUTROL 1 MMOL/ML IV SOLN COMPARISON:  CT abdomen 06/10/2019 FINDINGS: The examination is slightly degraded by motion artifact. Lower chest: Moderate hiatal hernia. Mild cardiomegaly. Trace left pleural effusion. Hepatobiliary: Simple cysts are seen within segment 4 B and segment 3 of the liver. No focal intrahepatic masses are identified. Mild intrahepatic and moderate extrahepatic biliary ductal dilation is unchanged from prior CT examination possibly representing post cholecystectomy change. No intraluminal filling defect is identified. Cholecystectomy has been performed. Pancreas: Imaging is degraded by motion artifact. No intrapancreatic lesions are seen. The pancreatic duct is not dilated. No  pancreatic divisum Spleen:  Unremarkable Adrenals/Urinary Tract: The adrenal glands are unremarkable. Simple cortical cyst noted arising exophytically from the interpolar region of the left kidney. The kidneys are otherwise unremarkable. Stomach/Bowel: Unremarkable Vascular/Lymphatic: No pathologic adenopathy within the abdomen. No abdominal aortic aneurysm. Superior mesenteric vein, splenic vein, and portal vein are patent. Renal veins and suprarenal inferior vena cava are unremarkable. Other: None significant Musculoskeletal: Susceptibility artifact secondary to lumbar fusion hardware is noted. Multiple compression fractures are seen within the a superior lumbar spine with areas of signal loss likely related to probable prior vertebroplasty. IMPRESSION: 1. Mild intrahepatic and moderate extrahepatic biliary ductal dilation is unchanged from prior CT examination possibly representing post cholecystectomy change. No intraluminal filling defect is identified. 2. No acute findings in the abdomen. 3. Moderate hiatal hernia. 4. Trace left pleural effusion. Electronically Signed   By: Fidela Salisbury MD   On: 12/27/2019 21:33   MR ABDOMEN MRCP W WO CONTAST  Result Date: 12/27/2019 CLINICAL DATA:  Abdominal pain, biliary obstruction EXAM: MRI ABDOMEN WITHOUT AND WITH CONTRAST (INCLUDING MRCP) TECHNIQUE: Multiplanar multisequence MR imaging of the abdomen was performed both before and after the administration of intravenous contrast. Heavily T2-weighted images of the biliary and pancreatic ducts were obtained, and three-dimensional MRCP images were rendered by post processing. CONTRAST:  88mL GADAVIST GADOBUTROL 1 MMOL/ML IV SOLN COMPARISON:  CT abdomen 06/10/2019 FINDINGS: The examination is slightly degraded by motion artifact. Lower chest: Moderate hiatal hernia. Mild cardiomegaly. Trace left pleural effusion. Hepatobiliary: Simple cysts are seen within segment 4 B and segment 3 of the liver. No focal intrahepatic  masses are identified. Mild intrahepatic and moderate extrahepatic biliary ductal dilation is unchanged from prior CT examination possibly representing post cholecystectomy change. No intraluminal filling defect is identified. Cholecystectomy has been performed. Pancreas: Imaging is degraded by motion artifact. No intrapancreatic lesions are seen. The pancreatic duct is not dilated. No pancreatic divisum Spleen:  Unremarkable Adrenals/Urinary Tract: The adrenal glands are unremarkable. Simple cortical cyst noted arising exophytically from the interpolar region of the left kidney. The kidneys are otherwise unremarkable. Stomach/Bowel: Unremarkable Vascular/Lymphatic: No pathologic adenopathy within the abdomen. No abdominal aortic aneurysm. Superior mesenteric vein, splenic vein, and portal vein are patent. Renal veins and suprarenal inferior vena cava are unremarkable. Other:  None significant Musculoskeletal: Susceptibility artifact secondary to lumbar fusion hardware is noted. Multiple compression fractures are seen within the a superior lumbar spine with areas of signal loss likely related to probable prior vertebroplasty. IMPRESSION: 1. Mild intrahepatic and moderate extrahepatic biliary ductal dilation is unchanged from prior CT examination possibly representing post cholecystectomy change. No intraluminal filling defect is identified. 2. No acute findings in the abdomen. 3. Moderate hiatal hernia. 4. Trace left pleural effusion. Electronically Signed   By: Fidela Salisbury MD   On: 12/27/2019 21:33        Scheduled Meds: . acetaminophen  500 mg Oral TID  . amLODipine  10 mg Oral Daily  . aspirin EC  81 mg Oral QHS  . bisacodyl  10 mg Rectal Once  . calcitonin (salmon)  1 spray Alternating Nares Daily  . cholecalciferol  5,000 Units Oral BID  . darifenacin  7.5 mg Oral Daily  . DULoxetine  30 mg Oral BID  . fluconazole  200 mg Oral Daily  . gabapentin  300 mg Oral QID  . heparin  5,000 Units  Subcutaneous Q8H  . metoprolol tartrate  25 mg Oral QHS  . mometasone-formoterol  2 puff Inhalation BID  . pantoprazole  40 mg Oral Daily  . polyethylene glycol  17 g Oral BID  . promethazine  12.5 mg Intravenous Q8H  . rOPINIRole  2 mg Oral TID  . senna-docusate  2 tablet Oral BID  . sucralfate  1 g Oral TID AC & HS  . traMADol  100 mg Oral Q6H  . vitamin B-12  1,000 mcg Oral Daily   Continuous Infusions:   LOS: 9 days    Time spent: 35 minutes    Irine Seal, MD [facility]   To contact the attending provider between 7A-7P or the covering provider during after hours 7P-7A, please log into the web site [email or web address] and access using universal [place] password for that web site. If you do not have the password, please call the hospital operator.  12/29/2019, 10:52 AM

## 2019-12-29 NOTE — Progress Notes (Signed)
[facility] Progress Note  CC:  Dysphagia   Subjective: Dysphagia has improved significantly. She still has mild epigastric discomfort. No severe pain. Voice hoarse x 3 days. No BM x 4 days. She was up in the chair x 2 yesterday, still in bed this am. No other complaints.   Objective:   Abdominal MRI with MRCP w/wo contrast 12/28/2019: 1. Mild intrahepatic and moderate extrahepatic biliary ductal dilation is unchanged from prior CT examination possibly representing post cholecystectomy change. No intraluminal filling defect is identified. 2. No acute findings in the abdomen. 3. Moderate hiatal hernia. 4. Trace left pleural effusion.  Vital signs in last 24 hours: Temp:  [98 F (36.7 C)-98.5 F (36.9 C)] 98 F (36.7 C) (08/22 0523) Pulse Rate:  [69-79] 69 (08/22 0523) Resp:  [14-22] 14 (08/22 0523) BP: (126-148)/(67-68) 126/67 (08/22 0523) SpO2:  [92 %-95 %] 95 % (08/22 0852) Weight:  [70.9 kg] 70.9 kg (08/22 0500) Last BM Date: 12/25/19   General:   Alert in NAD. Heart: RRR, no murmur.  Pulm:  Breath sounds clear throughout.  Abdomen: Soft, nondistended. Mild epigastric tenderness without rebound or guarding. Multiple ecchymotic patches to abdominal wall.  Extremities:  Without edema. Neurologic:  Alert and  oriented x 4. Speech is clear. Moves all extremities. Voice is softly spoken.  Psych:  Alert and cooperative. Normal mood and affect.  Intake/Output from previous day: 08/21 0701 - 08/22 0700 In: 240 [P.O.:240] Out: 300 [Urine:300] Intake/Output this shift: No intake/output data recorded.  Lab Results: Recent Labs    12/28/19 0611 12/29/19 0524  WBC 9.9 7.4  HGB 14.1 13.4  HCT 42.8 41.7  PLT 321 318   BMET Recent Labs    12/27/19 0518 12/28/19 0611 12/29/19 0524  NA 133* 134* 132*  K 4.3 4.3 4.5  CL 98 100 98  CO2 _0 GLUCOSE 117* 97 105*  BUN _1 CREATININE 0.82 0.62 0.64  CALCIUM 9.2 8.8* 8.9   LFT Recent Labs     12/28/19 0611 12/28/19 0611 12/29/19 0524  PROT 6.8   < > 6.5  ALBUMIN 3.3*   < > 3.2*  AST 13*   < > 13*  ALT 46*   < > 36  ALKPHOS 136*   < > 123  BILITOT 0.5   < > 0.7  BILIDIR 0.2  --   --   IBILI 0.3  --   --    < > = values in this interval not displayed.   PT/INR No results for input(s): LABPROT, INR in the last 72 hours. Hepatitis Panel No results for input(s): HEPBSAG, HCVAB, HEPAIGM, HEPBIGM in the last 72 hours.  MR 3D Recon At Scanner  Result Date: 12/27/2019 CLINICAL DATA:  Abdominal pain, biliary obstruction EXAM: MRI ABDOMEN WITHOUT AND WITH CONTRAST (INCLUDING MRCP) TECHNIQUE: Multiplanar multisequence MR imaging of the abdomen was performed both before and after the administration of intravenous contrast. Heavily T2-weighted images of the biliary and pancreatic ducts were obtained, and three-dimensional MRCP images were rendered by post processing. CONTRAST:  37m GADAVIST GADOBUTROL 1 MMOL/ML IV SOLN COMPARISON:  CT abdomen 06/10/2019 FINDINGS: The examination is slightly degraded by motion artifact. Lower chest: Moderate hiatal hernia. Mild cardiomegaly. Trace left pleural effusion. Hepatobiliary: Simple cysts are seen within segment 4 B and segment 3 of the liver. No focal intrahepatic masses are identified. Mild intrahepatic and moderate extrahepatic biliary ductal dilation is unchanged from prior CT examination possibly representing  post cholecystectomy change. No intraluminal filling defect is identified. Cholecystectomy has been performed. Pancreas: Imaging is degraded by motion artifact. No intrapancreatic lesions are seen. The pancreatic duct is not dilated. No pancreatic divisum Spleen:  Unremarkable Adrenals/Urinary Tract: The adrenal glands are unremarkable. Simple cortical cyst noted arising exophytically from the interpolar region of the left kidney. The kidneys are otherwise unremarkable. Stomach/Bowel: Unremarkable Vascular/Lymphatic: No pathologic adenopathy  within the abdomen. No abdominal aortic aneurysm. Superior mesenteric vein, splenic vein, and portal vein are patent. Renal veins and suprarenal inferior vena cava are unremarkable. Other: None significant Musculoskeletal: Susceptibility artifact secondary to lumbar fusion hardware is noted. Multiple compression fractures are seen within the a superior lumbar spine with areas of signal loss likely related to probable prior vertebroplasty. IMPRESSION: 1. Mild intrahepatic and moderate extrahepatic biliary ductal dilation is unchanged from prior CT examination possibly representing post cholecystectomy change. No intraluminal filling defect is identified. 2. No acute findings in the abdomen. 3. Moderate hiatal hernia. 4. Trace left pleural effusion. Electronically Signed   By: Fidela Salisbury MD   On: 12/27/2019 21:33   MR ABDOMEN MRCP W WO CONTAST  Result Date: 12/27/2019 CLINICAL DATA:  Abdominal pain, biliary obstruction EXAM: MRI ABDOMEN WITHOUT AND WITH CONTRAST (INCLUDING MRCP) TECHNIQUE: Multiplanar multisequence MR imaging of the abdomen was performed both before and after the administration of intravenous contrast. Heavily T2-weighted images of the biliary and pancreatic ducts were obtained, and three-dimensional MRCP images were rendered by post processing. CONTRAST:  44m GADAVIST GADOBUTROL 1 MMOL/ML IV SOLN COMPARISON:  CT abdomen 06/10/2019 FINDINGS: The examination is slightly degraded by motion artifact. Lower chest: Moderate hiatal hernia. Mild cardiomegaly. Trace left pleural effusion. Hepatobiliary: Simple cysts are seen within segment 4 B and segment 3 of the liver. No focal intrahepatic masses are identified. Mild intrahepatic and moderate extrahepatic biliary ductal dilation is unchanged from prior CT examination possibly representing post cholecystectomy change. No intraluminal filling defect is identified. Cholecystectomy has been performed. Pancreas: Imaging is degraded by motion artifact.  No intrapancreatic lesions are seen. The pancreatic duct is not dilated. No pancreatic divisum Spleen:  Unremarkable Adrenals/Urinary Tract: The adrenal glands are unremarkable. Simple cortical cyst noted arising exophytically from the interpolar region of the left kidney. The kidneys are otherwise unremarkable. Stomach/Bowel: Unremarkable Vascular/Lymphatic: No pathologic adenopathy within the abdomen. No abdominal aortic aneurysm. Superior mesenteric vein, splenic vein, and portal vein are patent. Renal veins and suprarenal inferior vena cava are unremarkable. Other: None significant Musculoskeletal: Susceptibility artifact secondary to lumbar fusion hardware is noted. Multiple compression fractures are seen within the a superior lumbar spine with areas of signal loss likely related to probable prior vertebroplasty. IMPRESSION: 1. Mild intrahepatic and moderate extrahepatic biliary ductal dilation is unchanged from prior CT examination possibly representing post cholecystectomy change. No intraluminal filling defect is identified. 2. No acute findings in the abdomen. 3. Moderate hiatal hernia. 4. Trace left pleural effusion. Electronically Signed   By: AFidela SalisburyMD   On: 12/27/2019 21:33    Assessment / Plan:  [age]year old female with a history of GERD,tortuous esophagus, candidiasis esophagitis with past [employer] fundoplication with the development of dysphagia. She has oral candidiasis, most likely recurrent candidiasis esophagitis. Started on Fluconazole 408mx 1 on 8/20 then 20085mD x 20 days. Dysphagia symptoms improving.  -Continue Fluconazole  -ContinuePantoprazole 40 mg p.o. daily -Repeat EGD if no further improvement   2. Lower chest and epigastric pain, improving -See plan in # 1  3.  Elevated LFTs. Admission Alk phos 184. AST 871. ALT 621. T. Bili 3.5.Elevated GGT 277. LFTs continue to normalize over the past few days. Today Alk phos 123. AST 13. ALT 36. T. Bili 0.7.  ANA, AMA  and SMA levels pending. Abdominal MRI/MRCP 8/20 showed mild intrahepatic and moderate extrahepatic biliary ductal dilatation (unchanged from a prior CT 06/2019).  No evidence of choledocholithiasis.  It is possible that she had a common bile duct stone which passed.  Past cholecystectomy.  A moderate hiatal hernia and trace left pleural pleural effusion were noted. -Repeat hepatic panel if she develops N/V, lowe chest or epigastric pain -Await ANA, SMA, AMA results  4.Pubic ramus fracture  5. History of CAD s/p stent 2003  6. History of DM II  7. History of CKD stage III. Cr 0.82. GFR > 60.  8. History of tubular adenomatous colon polyps. -Next colonoscopy due Jan 2022  9. Constipation Dulcolax suppository and Milk of Magnesia ordered by the hospitalist   Further recommendations per Dr. Hilarie Fredrickson     Principal Problem:   Pubic ramus fracture [facility]) Active Problems:   Coronary artery disease involving native heart without angina pectoris   Ileus ([place])   Multiple closed pelvic fractures without disruption of pelvic circle (HCC)   Hyponatremia   Constipation   Impaired glucose tolerance   Hiatal hernia   History of fundoplication   Leukocytosis   Esophageal candidiasis (HCC)   Elevated LFTs     LOS: 9 days   Patrecia Pour Kennedy-Smith  12/29/2019, 10:27 AM

## 2019-12-30 LAB — ANA: Anti Nuclear Antibody (ANA): NEGATIVE

## 2019-12-30 LAB — COMPREHENSIVE METABOLIC PANEL
ALT: 31 U/L (ref 0–44)
AST: 15 U/L (ref 15–41)
Albumin: 3.3 g/dL — ABNORMAL LOW (ref 3.5–5.0)
Alkaline Phosphatase: 116 U/L (ref 38–126)
Anion gap: 11 (ref 5–15)
BUN: 15 mg/dL (ref 8–23)
CO2: 24 mmol/L (ref 22–32)
Calcium: 9 mg/dL (ref 8.9–10.3)
Chloride: 98 mmol/L (ref 98–111)
Creatinine, Ser: 0.71 mg/dL (ref 0.44–1.00)
GFR calc Af Amer: >60 mL/min (ref >60)
GFR calc non Af Amer: >60 mL/min (ref >60)
Glucose, Bld: 101 mg/dL — ABNORMAL HIGH (ref 70–99)
Potassium: 4.3 mmol/L (ref 3.5–5.1)
Sodium: 133 mmol/L — ABNORMAL LOW (ref 135–145)
Total Bilirubin: 0.7 mg/dL (ref 0.3–1.2)
Total Protein: 6.5 g/dL (ref 6.5–8.1)

## 2019-12-30 LAB — CBC
HCT: 41.1 % (ref 36.0–46.0)
Hemoglobin: 13.1 g/dL (ref 12.0–15.0)
MCH: 30.9 pg (ref 26.0–34.0)
MCHC: 31.9 g/dL (ref 30.0–36.0)
MCV: 96.9 fL (ref 80.0–100.0)
Platelets: 349 10*3/uL (ref 150–400)
RBC: 4.24 MIL/uL (ref 3.87–5.11)
RDW: 14.2 % (ref 11.5–15.5)
WBC: 8.6 10*3/uL (ref 4.0–10.5)
nRBC: 0 % (ref 0.0–0.2)

## 2019-12-30 LAB — MITOCHONDRIAL ANTIBODIES: Mitochondrial M2 Ab, IgG: <20 Units (ref 0.0–20.0)

## 2019-12-30 LAB — ANTI-SMOOTH MUSCLE ANTIBODY, IGG: F-Actin IgG: 8 Units (ref 0–19)

## 2019-12-30 LAB — SARS CORONAVIRUS 2 BY RT PCR (HOSPITAL ORDER, PERFORMED IN ~~LOC~~ HOSPITAL LAB): SARS Coronavirus 2: NEGATIVE

## 2019-12-30 MED ORDER — OXYCODONE HCL 5 MG PO TABS: 2.5000 mg | ORAL_TABLET | Freq: Two times a day (BID) | ORAL | Status: DC | PRN

## 2019-12-30 NOTE — Progress Notes (Addendum)
Physical Therapy Treatment Patient Details Name: [name] [name] MRN: [id number] DOB: [birth year] Today's Date: 12/30/2019    History of Present Illness 84 y.o. female admitted with R pubic ramus fx + R ischium/acetabulum fx. Hx of TIA, hypertension, hyperlipidemia, GERD, CKD 3, CAD, anxiety, chronic pain, vertigo, kyphoplasty    PT Comments    Educated pt on change in WB status per ortho MD note on 8/22. Pt is still experiencing significant pain with mobility. She did well adhering to new TDWB status. Continue to recommend ST SNF for rehab.    Follow Up Recommendations  SNF     Equipment Recommendations   (TBD at next venue)    Recommendations for Other Services       Precautions / Restrictions Precautions Precautions: Fall Restrictions Weight Bearing Restrictions: Yes RLE Weight Bearing: Touchdown weight bearing Other Position/Activity Restrictions: per ortho progress note/orders on 8/22-(Nitka)    Mobility  Bed Mobility Overal bed mobility: Needs Assistance Bed Mobility: Supine to Sit     Supine to sit: Mod assist;HOB elevated     General bed mobility comments: Assist for trunk and bil LEs. Increased time. Cues for technique.  Transfers Overall transfer level: Needs assistance Equipment used: Rolling walker (2 wheeled) Transfers: Sit to/from Stand Sit to Stand: Min assist Stand pivot transfers: Min assist       General transfer comment: Assist to rise, stabilize, control descent. VCs safety, hand placement, sequence/technique, adherence to TDWB status.  Ambulation/Gait Min Assist; +2 safety/equipment Gait Distance (Feet): 3 Feet Assistive device: Rolling walker (2 wheeled) Gait Pattern/deviations: Step-to pattern;Trunk flexed     General Gait Details: Cues for safety, technique, sequence, adherence to TDWB status. Pt only able to tolerate a few steps before needing to sit down 2* pain.   Stairs             Wheelchair Mobility    Modified  Rankin (Stroke Patients Only)       Balance Overall balance assessment: Needs assistance;History of Falls         Standing balance support: Bilateral upper extremity supported Standing balance-Leahy Scale: Poor                              Cognition Arousal/Alertness: Awake/alert Behavior During Therapy: WFL for tasks assessed/performed Overall Cognitive Status: Within Functional Limits for tasks assessed                                        Exercises      General Comments        Pertinent Vitals/Pain Pain Assessment: 0-10 Faces Pain Scale: Hurts even more Pain Location: pelvis Pain Descriptors / Indicators: Discomfort;Grimacing;Guarding;Sharp;Moaning Pain Intervention(s): Limited activity within patient's tolerance;Monitored during session;Repositioned    Home Living                      Prior Function            PT Goals (current goals can now be found in the care plan section) Progress towards PT goals: Progressing toward goals    Frequency    Min 3X/week (for insurance purposes)      PT Plan Current plan remains appropriate    Co-evaluation              AM-PAC PT "6 Clicks" Mobility   Outcome Measure  Help needed turning from your back to your side while in a flat bed without using bedrails?: A Lot Help needed moving from lying on your back to sitting on the side of a flat bed without using bedrails?: A Lot Help needed moving to and from a bed to a chair (including a wheelchair)?: A Little Help needed standing up from a chair using your arms (e.g., wheelchair or bedside chair)?: A Little Help needed to walk in hospital room?: A Lot Help needed climbing 3-5 steps with a railing? : Total 6 Click Score: 13    End of Session Equipment Utilized During Treatment: Gait belt Activity Tolerance: Patient limited by pain;Patient limited by fatigue Patient left: in chair;with call bell/phone within reach;with  chair alarm set   PT Visit Diagnosis: Muscle weakness (generalized) (M62.81);Pain;Difficulty in walking, not elsewhere classified (R26.2);History of falling (Z91.81) Pain - part of body:  (pelvis)     Time: 0211-1552 PT Time Calculation (min) (ACUTE ONLY): 19 min  Charges:  $Gait Training: 8-22 mins                         Doreatha Massed, PT Acute Rehabilitation  Office: [phone] Pager: [phone]

## 2019-12-30 NOTE — Progress Notes (Signed)
PROGRESS NOTE    [name] [name]  ELF:[id number] DOB: 08/09/1935 DOA: 12/20/2019 PCP: [name], MD   Chief Complaint  Patient presents with  . Fall  . Hip Pain    right hip    Brief Narrative:  72 white female prior TIA reflux CKD 3 anxiety CAD (PTCA 2003) hiatal hernia status post fundoplication 5852 admitted earlier this year for intractable nausea vomiting DM TY 2 CKD 2-3 Chronic low back pain pain syndrome status post kyphoplasty Dr. Nicholaus Bloom On gabapentin Robaxin previously? Accidental fall at home 8/13 resulting in 10/10 pain in right hip-imaging showed pubis ramus fracture with acetabular fractures Previous also recent  fall 7/22 2021 with a trip and a fall and struck her right wrist and landed on her chest and was prescribed prednisone by nurse practitioner in the office for chest pain  Treated with Toradol Zofran in ED and admitted for pain control   Assessment & Plan:   Principal Problem:   Insufficiency fracture of pelvis Active Problems:   Coronary artery disease involving native heart without angina pectoris   Ileus (HCC)   Pubic ramus fracture ([place])   Multiple closed pelvic fractures without disruption of pelvic circle (HCC)   Hyponatremia   Constipation   Impaired glucose tolerance   Hiatal hernia   History of fundoplication   Leukocytosis   Esophageal candidiasis (HCC)   Elevated LFTs  1 right-sided pubic ramus fracture Nonoperative per orthopedics.  Secondary to mechanical fall.  Medications being adjusted for better pain control.  Patient noted to have some intolerance to IV morphine for severe pain due to nausea and vomiting.  Patient noted not to be able to take NSAIDs which have been discontinued.  Patient still rates her pain a 8/10 after pain medication, still needing some IV morphine with dose has been decreased.  Patient noted to be declining use of oxycodone early on in the hospitalization however is in agreement for short trial for  acute pain control.  Patient unable to take NSAIDs secondary to prior fundoplication.  Continue as needed Flexeril.  Patient also noted to be on Robaxin scheduled which patient seems to be refusing and as such Robaxin has been discontinued.  Patient with some improvement with pain control however not ready for discharge yet. Patient is in agreement to go to a skilled nursing facility and as such social work consulted.  Continue scheduled Tylenol 3 times daily, Neurontin, Cymbalta, Flexeril as needed.  Decrease oxycodone dose to 2.5 mg every 12 hours as needed due to concerns for possible mild confusion versus sedation per husband.  Patient seen in consultation by Dr. Lorin Mercy orthopedics who  recommending nonoperative management with outpatient follow-up post discharge in 1 to 2 weeks.  Follow.  2.  Mild ileus Likely secondary to immobility and from pain medications.  Seems to have resolved.  Patient has been placed on laxatives.  Continue Senokot S2 twice daily, MiraLAX twice daily.  Soapsuds enema.  Dulcolax suppository x1.  3.  Dysphagia/??  Candida esophagitis/oral candidiasis Patient with complaints of food getting stuck in the esophagus the morning of 12/27/2019.  Patient with history of hiatal hernia status post fundoplication.  Patient was on clear liquid diet.  Patient seen by GI and it was felt per GI patient had oral candidiasis and felt concern for Candida esophagitis and patient subsequently started on fluconazole empirically per GI recommendations to complete a 3-week course.  Patient improving clinically.  Denies any nausea, no significant abdominal pain.  Diet  advanced to soft diet which patient tolerated last night and this morning.  Patient after receiving her morning medications with some complaints of epigastric pain.  Will monitor with diet.  If ongoing epigastric pain will have GI reassess patient as GI has signed off as of 12/29/2019.  Supportive care.    4.  Hypertension/coronary artery  disease Stable.  Continue Norvasc, Lopressor, aspirin.   5.  Hyponatremia Likely secondary to tea/toast Potomania from drinking too much fluid.  Patient fluid restricted to 1500 cc/day.  Sodium levels and currently at 133.  Follow.  6.  Severe constipation Patient received sorbitol with good effects early on in the hospitalization.  Patient with hard bowel movement yesterday with some streaks of blood noted.  Continue MiraLAX twice daily, Senokot-S twice daily.  Soapsuds enema x1.  Dulcolax suppository x1.  Follow.  7.  Hiatial hernia with fundoplication No NSAIDs.  Continue Carafate, PPI.   8.  Impaired glucose tolerance Outpatient follow-up with PCP.  9.  Prior spinal stenosis status post kyphoplasty in the remote past with chronic pain Continue gabapentin.  Continue Prolia, salmon calcitonin presumably for osteoporotic fracture in the outpatient setting.  Follow.   10.  Leukocytosis Felt likely steroid-induced.  Steroids discontinued.  Resolved. Follow.  11.  Lower abdominal discomfort Concern for urinary retention.  I and O cath with 800 cc of urine output on 12/26/2019.  Patient with urine output per RN. I and O cath prn.   12.  Transaminitis Concern for past common bile duct stone.  Patient noted to have elevated GGT.  Patient on admission noted to have a significantly elevated LFTs.  MRCP done 12/27/2019 with mild intrahepatic and moderate extrahepatic biliary ductal dilatation unchanged from prior CT of 06/2019.  No evidence of choledocholithiasis.  GI has ordered hepatic panel which shows improvement with LFTs.  ANA, anti-smooth muscle antibody, antimitochondrial antibodies pending.  GI was following.   DVT prophylaxis: Heparin Code Status: Full Family Communication: Updated patient and husband at bedside. Disposition:   Status is: Inpatient    Dispo:  Patient From: Home  Planned Disposition: [place]  Expected discharge date: 12/31/2019  Medically not  stable for discharge: Pain being currently managed.  Dysphagia improving.  GI following.         Consultants:   Orthopedics: Dr. Lorin Mercy 12/21/2019  GI: Dr. Hilarie Fredrickson  Procedures:   CT pelvis 12/20/2019  Abdominal x-ray 12/24/2019  Chest x-ray 12/20/2019  MRCP 12/27/2019  Antimicrobials:   None   Subjective: Patient sitting in chair.  No chest pain.  No shortness of breath.  Tolerated soft diet last night and this morning.  Stated got medications with applesauce with some complaints of epigastric pain early on this morning.  Had a hard stool yesterday.  Some improvement with pain management however has been diffuse patient with some bouts of confusion likely secondary to opioid pain medication.    Objective: Vitals:   12/29/19 2121 12/30/19 0444 12/30/19 0453 12/30/19 0808  BP:  122/64    Pulse:  78    Resp:  14    Temp:  99 F (37.2 C)    TempSrc:  Oral    SpO2: 95% 92%  94%  Weight:   69.2 kg   Height:        Intake/Output Summary (Last 24 hours) at 12/30/2019 1222 Last data filed at 12/29/2019 1800 Gross per 24 hour  Intake --  Output 300 ml  Net -300 ml   [employer]  12/28/19 0500 12/29/19 0500 12/30/19 0453  Weight: 69.9 kg 70.9 kg 69.2 kg    Examination:  General exam: NAD.  Respiratory system: Lungs clear to auscultation bilaterally.  No wheezes, no crackles, no rhonchi.  Normal respiratory effort.  Speaking in full sentences.  Cardiovascular system: RRR no murmurs rubs or gallops.  No JVD.  No lower extremity edema.  Gastrointestinal system: Abdomen is nontender, nondistended, soft, positive bowel sounds.  No rebound.  No guarding.  Central nervous system: Alert and oriented. No focal neurological deficits. Extremities: Symmetric 5 x 5 power. Skin: No rashes, lesions or ulcers Psychiatry: Judgement and insight appear normal. Mood & affect appropriate.     Data Reviewed: I have personally reviewed following labs and imaging studies  CBC: Recent  Labs  Lab 12/24/19 0859 12/24/19 0859 12/25/19 0601 12/25/19 0601 12/26/19 0542 12/28/19 4709 12/29/19 0524 12/29/19 1854 12/30/19 0612  WBC 10.3   < > 10.3  --  11.2* 9.9 7.4  --  8.6  NEUTROABS 6.9  --  6.4  --  7.2 6.8  --   --   --   HGB 14.3   < > 14.7   < > 14.4 14.1 13.4 13.9 13.1  HCT 43.8   < > 46.0   < > 44.8 42.8 41.7 42.6 41.1  MCV 96.3   < > 97.0  --  97.0 96.4 97.4  --  96.9  PLT 274   < > 267  --  265 321 318  --  349   < > = values in this interval not displayed.    Basic Metabolic Panel: Recent Labs  Lab 12/26/19 1331 12/27/19 0518 12/28/19 0611 12/29/19 0524 12/30/19 0612  NA 132* 133* 134* 132* 133*  K 4.3 4.3 4.3 4.5 4.3  CL 96* 98 100 98 98  CO2 24 25 25 25 24   GLUCOSE 101* 117* 97 105* 101*  BUN 21 18 15 13 15   CREATININE 0.76 0.82 0.62 0.64 0.71  CALCIUM 8.9 9.2 8.8* 8.9 9.0    GFR: Estimated Creatinine Clearance: 51.8 mL/min (by C-G formula based on SCr of 0.71 mg/dL).  Liver Function Tests: Recent Labs  Lab 12/26/19 0542 12/27/19 0518 12/28/19 0611 12/29/19 0524 12/30/19 0612  AST 18 14* 13* 13* 15  ALT 95* 64* 46* 36 31  ALKPHOS 173* 151* 136* 123 116  BILITOT 1.0 1.0 0.5 0.7 0.7  PROT 6.7 6.5 6.8 6.5 6.5  ALBUMIN 3.4* 3.4* 3.3* 3.2* 3.3*    CBG: No results for input(s): GLUCAP in the last 168 hours.   Recent Results (from the past 240 hour(s))  SARS Coronavirus 2 by RT PCR (hospital order, performed in [facility] hospital lab) Nasopharyngeal     Status: None   Collection Time: 12/20/19  8:52 PM   Specimen: Nasopharyngeal  Result Value Ref Range Status   SARS Coronavirus 2 NEGATIVE NEGATIVE Final    Comment: (NOTE) SARS-CoV-2 target nucleic acids are NOT DETECTED.  The SARS-CoV-2 RNA is generally detectable in upper and lower respiratory specimens during the acute phase of infection. The lowest concentration of SARS-CoV-2 viral copies this assay can detect is 250 copies / mL. A negative result does not preclude  SARS-CoV-2 infection and should not be used as the sole basis for treatment or other patient management decisions.  A negative result may occur with improper specimen collection / handling, submission of specimen other than nasopharyngeal swab, presence of viral mutation(s) within the areas targeted by this assay,  and inadequate number of viral copies (<250 copies / mL). A negative result must be combined with clinical observations, patient history, and epidemiological information.  Fact Sheet for Patients:   StrictlyIdeas.no  Fact Sheet for Healthcare Providers: BankingDealers.co.za  This test is not yet approved or  cleared by the [place] FDA and has been authorized for detection and/or diagnosis of SARS-CoV-2 by FDA under an Emergency Use Authorization (EUA).  This EUA will remain in effect (meaning this test can be used) for the duration of the COVID-19 declaration under Section 564(b)(1) of the Act, 21 U.S.C. section 360bbb-3(b)(1), unless the authorization is terminated or revoked sooner.  Performed at [facility], [place] [address] [place], [place] [address]          Radiology Studies: No results found.      Scheduled Meds: . acetaminophen  500 mg Oral TID  . amLODipine  10 mg Oral Daily  . aspirin EC  81 mg Oral QHS  . calcitonin (salmon)  1 spray Alternating Nares Daily  . cholecalciferol  5,000 Units Oral BID  . darifenacin  7.5 mg Oral Daily  . DULoxetine  30 mg Oral BID  . fluconazole  200 mg Oral Daily  . gabapentin  300 mg Oral QID  . heparin  5,000 Units Subcutaneous Q8H  . metoprolol tartrate  25 mg Oral QHS  . mometasone-formoterol  2 puff Inhalation BID  . pantoprazole  40 mg Oral Daily  . polyethylene glycol  17 g Oral BID  . promethazine  12.5 mg Intravenous Q8H  . rOPINIRole  2 mg Oral TID  . senna-docusate  2 tablet Oral BID  . sucralfate  1 g Oral TID AC & HS  .  traMADol  100 mg Oral Q6H  . vitamin B-12  1,000 mcg Oral Daily   Continuous Infusions:   LOS: 10 days    Time spent: 35 minutes    Irine Seal, MD [facility]   To contact the attending provider between 7A-7P or the covering provider during after hours 7P-7A, please log into the web site [email or web address] and access using universal [place] password for that web site. If you do not have the password, please call the hospital operator.  12/30/2019, 12:22 PM

## 2019-12-30 NOTE — TOC Progression Note (Signed)
Transition of Care [facility]) - Progression Note    Patient Details  Name: [name] [name] MRN: [id number] Date of Birth: [birth year]  [facility]) CM/SW Contact  Aibhlinn Kalmar, Juliann Pulse, RN Phone Number: 12/30/2019, 9:55 AM  Clinical Narrative: Has auth for [facility] 8/20-8/25#[id number];auth for [facility] #[id number]. Await medical stability-will need 24hr notice prior for d/c, & day of d/c-d/c summary by 2p.     Expected Discharge Plan: [place] Barriers to Discharge: Continued Medical Work up  Expected Discharge Plan and Services Expected Discharge Plan: [place]   Discharge Planning Services: CM Consult Post Acute Care Choice: [place] Living arrangements for the past 2 months: Single Family Home                                       Social Determinants of Health (SDOH) Interventions    Readmission Risk Interventions No flowsheet data found.

## 2019-12-30 NOTE — Plan of Care (Signed)
  Problem: Health Behavior/Discharge Planning: Goal: Ability to manage health-related needs will improve Outcome: Progressing   Problem: Clinical Measurements: Goal: Ability to maintain clinical measurements within normal limits will improve Outcome: Progressing   Problem: Nutrition: Goal: Adequate nutrition will be maintained Outcome: Progressing   Problem: Coping: Goal: Level of anxiety will decrease Outcome: Progressing   Problem: Elimination: Goal: Will not experience complications related to bowel motility Outcome: Progressing Goal: Will not experience complications related to urinary retention Outcome: Progressing   Problem: Pain Managment: Goal: General experience of comfort will improve Outcome: Progressing   Problem: Safety: Goal: Ability to remain free from injury will improve Outcome: Progressing   Problem: Skin Integrity: Goal: Risk for impaired skin integrity will decrease Outcome: Progressing

## 2019-12-30 NOTE — Care Management Important Message (Signed)
Important Message  Patient Details IM Letter given to the Patient Name: [name] [name] MRN: [id number] Date of Birth: 1936-05-02   Medicare Important Message Given:  Yes     Kerin Salen 12/30/2019, 10:54 AM

## 2019-12-31 DIAGNOSIS — R2681 Unsteadiness on feet: Secondary | ICD-10-CM | POA: Diagnosis not present

## 2019-12-31 DIAGNOSIS — S32599D Other specified fracture of unspecified pubis, subsequent encounter for fracture with routine healing: Secondary | ICD-10-CM | POA: Diagnosis not present

## 2019-12-31 DIAGNOSIS — R110 Nausea: Secondary | ICD-10-CM | POA: Diagnosis not present

## 2019-12-31 DIAGNOSIS — G459 Transient cerebral ischemic attack, unspecified: Secondary | ICD-10-CM | POA: Diagnosis not present

## 2019-12-31 DIAGNOSIS — N183 Chronic kidney disease, stage 3 unspecified: Secondary | ICD-10-CM | POA: Diagnosis not present

## 2019-12-31 DIAGNOSIS — K440 Diaphragmatic hernia with obstruction, without gangrene: Secondary | ICD-10-CM | POA: Diagnosis not present

## 2019-12-31 DIAGNOSIS — F419 Anxiety disorder, unspecified: Secondary | ICD-10-CM | POA: Diagnosis not present

## 2019-12-31 DIAGNOSIS — R41841 Cognitive communication deficit: Secondary | ICD-10-CM | POA: Diagnosis not present

## 2019-12-31 DIAGNOSIS — M2550 Pain in unspecified joint: Secondary | ICD-10-CM | POA: Diagnosis not present

## 2019-12-31 DIAGNOSIS — R279 Unspecified lack of coordination: Secondary | ICD-10-CM | POA: Diagnosis not present

## 2019-12-31 DIAGNOSIS — F29 Unspecified psychosis not due to a substance or known physiological condition: Secondary | ICD-10-CM | POA: Diagnosis not present

## 2019-12-31 DIAGNOSIS — K567 Ileus, unspecified: Secondary | ICD-10-CM | POA: Diagnosis not present

## 2019-12-31 DIAGNOSIS — S3282XD Multiple fractures of pelvis without disruption of pelvic ring, subsequent encounter for fracture with routine healing: Secondary | ICD-10-CM | POA: Diagnosis not present

## 2019-12-31 DIAGNOSIS — E782 Mixed hyperlipidemia: Secondary | ICD-10-CM | POA: Diagnosis not present

## 2019-12-31 DIAGNOSIS — K5900 Constipation, unspecified: Secondary | ICD-10-CM | POA: Diagnosis not present

## 2019-12-31 DIAGNOSIS — Z7401 Bed confinement status: Secondary | ICD-10-CM | POA: Diagnosis not present

## 2019-12-31 DIAGNOSIS — R2689 Other abnormalities of gait and mobility: Secondary | ICD-10-CM | POA: Diagnosis not present

## 2019-12-31 DIAGNOSIS — R7989 Other specified abnormal findings of blood chemistry: Secondary | ICD-10-CM | POA: Diagnosis not present

## 2019-12-31 DIAGNOSIS — D72829 Elevated white blood cell count, unspecified: Secondary | ICD-10-CM | POA: Diagnosis not present

## 2019-12-31 DIAGNOSIS — K449 Diaphragmatic hernia without obstruction or gangrene: Secondary | ICD-10-CM | POA: Diagnosis not present

## 2019-12-31 DIAGNOSIS — S3282XS Multiple fractures of pelvis without disruption of pelvic ring, sequela: Secondary | ICD-10-CM

## 2019-12-31 DIAGNOSIS — I2510 Atherosclerotic heart disease of native coronary artery without angina pectoris: Secondary | ICD-10-CM | POA: Diagnosis not present

## 2019-12-31 DIAGNOSIS — E785 Hyperlipidemia, unspecified: Secondary | ICD-10-CM | POA: Diagnosis not present

## 2019-12-31 DIAGNOSIS — R7302 Impaired glucose tolerance (oral): Secondary | ICD-10-CM | POA: Diagnosis not present

## 2019-12-31 DIAGNOSIS — M84454S Pathological fracture, pelvis, sequela: Secondary | ICD-10-CM | POA: Diagnosis not present

## 2019-12-31 DIAGNOSIS — W06XXXA Fall from bed, initial encounter: Secondary | ICD-10-CM | POA: Diagnosis not present

## 2019-12-31 DIAGNOSIS — M6281 Muscle weakness (generalized): Secondary | ICD-10-CM | POA: Diagnosis not present

## 2019-12-31 DIAGNOSIS — M84454D Pathological fracture, pelvis, subsequent encounter for fracture with routine healing: Secondary | ICD-10-CM | POA: Diagnosis not present

## 2019-12-31 DIAGNOSIS — R3912 Poor urinary stream: Secondary | ICD-10-CM | POA: Diagnosis not present

## 2019-12-31 DIAGNOSIS — F411 Generalized anxiety disorder: Secondary | ICD-10-CM | POA: Diagnosis not present

## 2019-12-31 DIAGNOSIS — K210 Gastro-esophageal reflux disease with esophagitis: Secondary | ICD-10-CM | POA: Diagnosis not present

## 2019-12-31 DIAGNOSIS — K5790 Diverticulosis of intestine, part unspecified, without perforation or abscess without bleeding: Secondary | ICD-10-CM | POA: Diagnosis not present

## 2019-12-31 DIAGNOSIS — K219 Gastro-esophageal reflux disease without esophagitis: Secondary | ICD-10-CM | POA: Diagnosis not present

## 2019-12-31 DIAGNOSIS — Z9889 Other specified postprocedural states: Secondary | ICD-10-CM | POA: Diagnosis not present

## 2019-12-31 DIAGNOSIS — I10 Essential (primary) hypertension: Secondary | ICD-10-CM | POA: Diagnosis not present

## 2019-12-31 DIAGNOSIS — E871 Hypo-osmolality and hyponatremia: Secondary | ICD-10-CM | POA: Diagnosis not present

## 2019-12-31 DIAGNOSIS — R5381 Other malaise: Secondary | ICD-10-CM | POA: Diagnosis not present

## 2019-12-31 DIAGNOSIS — B3781 Candidal esophagitis: Secondary | ICD-10-CM | POA: Diagnosis not present

## 2019-12-31 LAB — BASIC METABOLIC PANEL
Anion gap: 12 (ref 5–15)
BUN: 13 mg/dL (ref 8–23)
CO2: 22 mmol/L (ref 22–32)
Calcium: 8.8 mg/dL — ABNORMAL LOW (ref 8.9–10.3)
Chloride: 96 mmol/L — ABNORMAL LOW (ref 98–111)
Creatinine, Ser: 0.64 mg/dL (ref 0.44–1.00)
GFR calc Af Amer: >60 mL/min (ref >60)
GFR calc non Af Amer: >60 mL/min (ref >60)
Glucose, Bld: 93 mg/dL (ref 70–99)
Potassium: 4.5 mmol/L (ref 3.5–5.1)
Sodium: 130 mmol/L — ABNORMAL LOW (ref 135–145)

## 2019-12-31 LAB — HEMOGLOBIN AND HEMATOCRIT, BLOOD
HCT: 44.2 % (ref 36.0–46.0)
Hemoglobin: 14 g/dL (ref 12.0–15.0)

## 2019-12-31 MED ORDER — ROPINIROLE HCL 2 MG PO TABS: 2.0000 mg | ORAL_TABLET | Freq: Three times a day (TID) | ORAL | 0 refills | Status: DC

## 2019-12-31 MED ORDER — ALPRAZOLAM 0.5 MG PO TABS: 0.2500 mg | ORAL_TABLET | Freq: Every day | ORAL | 0 refills | Status: DC | PRN

## 2019-12-31 MED ORDER — SENNOSIDES-DOCUSATE SODIUM 8.6-50 MG PO TABS: 2.0000 | ORAL_TABLET | Freq: Two times a day (BID) | ORAL | 1 refills | Status: AC

## 2019-12-31 MED ORDER — FLUCONAZOLE 200 MG PO TABS: 200.0000 mg | ORAL_TABLET | Freq: Every day | ORAL | 0 refills | Status: AC

## 2019-12-31 MED ORDER — GABAPENTIN 300 MG PO CAPS
300.0000 mg | ORAL_CAPSULE | Freq: Four times a day (QID) | ORAL | 0 refills | Status: DC
Start: 2019-12-31 — End: 2021-05-12

## 2019-12-31 MED ORDER — PROCHLORPERAZINE MALEATE 5 MG PO TABS: 5.0000 mg | ORAL_TABLET | Freq: Four times a day (QID) | ORAL | 0 refills | Status: DC | PRN

## 2019-12-31 MED ORDER — TRAMADOL HCL 50 MG PO TABS
100.0000 mg | ORAL_TABLET | Freq: Four times a day (QID) | ORAL | 0 refills | Status: DC
Start: 2019-12-31 — End: 2020-03-20

## 2019-12-31 MED ORDER — DICYCLOMINE HCL 20 MG PO TABS: 20.0000 mg | ORAL_TABLET | Freq: Three times a day (TID) | ORAL | 0 refills | Status: DC | PRN

## 2019-12-31 MED ORDER — BISACODYL 10 MG RE SUPP: 10.0000 mg | RECTAL | 0 refills | Status: DC | PRN

## 2019-12-31 MED ORDER — ACETAMINOPHEN 500 MG PO TABS: 500.0000 mg | ORAL_TABLET | Freq: Three times a day (TID) | ORAL | 0 refills | Status: AC

## 2019-12-31 MED ORDER — CYCLOBENZAPRINE HCL 5 MG PO TABS: 5.0000 mg | ORAL_TABLET | Freq: Three times a day (TID) | ORAL | 0 refills | Status: DC | PRN

## 2019-12-31 MED ORDER — MENTHOL 3 MG MT LOZG: 1.0000 | LOZENGE | OROMUCOSAL | 0 refills | Status: DC | PRN

## 2019-12-31 NOTE — Discharge Summary (Signed)
Physician Discharge Summary  [name] [name] TFT:[id number] DOB: 06/03/1935 DOA: 12/20/2019  PCP: [name], MD  Admit date: 12/20/2019 Discharge date: 12/31/2019  Time spent: 50 minutes  Recommendations for Outpatient Follow-up:  1. Follow-up with Dr. Lorin Mercy in 2 weeks. 2. Follow-up with Dr. Havery Moros, gastroenterology in 2 to 3 weeks. 3. Follow-up with MD at skilled nursing facility.  Patient will need pain management reassessed.  Patient will need a basic metabolic profile done in 1 week to follow-up on electrolytes and renal function.   Discharge Diagnoses:  Principal Problem:   Insufficiency fracture of pelvis Active Problems:   Coronary artery disease involving native heart without angina pectoris   Ileus (HCC)   Pubic ramus fracture (HCC)   Multiple closed pelvic fractures without disruption of pelvic circle (HCC)   Hyponatremia   Constipation   Impaired glucose tolerance   Hiatal hernia   History of fundoplication   Leukocytosis   Esophageal candidiasis (HCC)   Elevated LFTs   Discharge Condition: Stable and improved  Diet recommendation: Soft diet/1500 cc/day fluid restriction  Filed Weights   12/29/19 0500 12/30/19 0453 12/31/19 0500  Weight: 70.9 kg 69.2 kg 70.6 kg    History of present illness:  HPI per Dr. Josph Macho- [name] [name]  is a 84 y.o. female, TIA, hypertension, hyperlipidemia, GERD, CKD 3, CAD, anxiety, and more presented to the ED with chief complaint of fall.  Patient reported that she was going up stairs steps at her home with a glass of tea, and the next and she knew she was on the ground.  Patient reported that she landed on her right hip, left fifth digit, on cement ground.  She had instant pain and required the help of 2 people to get up to a chair until the paramedics came.  Patient reported that she has otherwise been in her normal state of health.  Prior to the fall she does not remember any chest pain, dyspnea, palpitations, or  dizziness.  She reported that she does not think she lost consciousness.  She did not hit her head.  Patient was not on any blood thinners.  Patient did not remember tripping, but it was about a 6 inch tall step that she was attempting to navigate when she fell.  Patient reported that her pain was 10 out of 10.  Came down with treatment in the ER.  Her pain is creeping back up to 7-8 out of 10 at the time of my exam.  She describes it as sharp and achy.  Patient has no other complaints at this time.  Patient denied chest pain at this time - despite current treatment outpatient for "chest wall pain." Per chart review - this pain was subsequent to a fall one week ago. She did have a negative CXR with clear lungs at that time. Patient preferred to avoid "strong narcotics" at that time, and so short term prednisone was ordered. Near to the end of her course, patient reported that she felt better, but not 100%, so she requested a second course of prednisone. The first started Jul29, the second Aug 3.   In the ED Temperature 98.5, blood pressure 157/74, 95% on room air, 72 heart rate, respirations 11 Leukocytosis of 23.1-patient has been on prednisone in the outpatient setting for "chest wall pain." Covid pending UA negative CT shows fractures through right inferior pubic ramus and right ischium/anteromedial acetabulum Patient was given Toradol 30, morphine 4, Zofran 4 troponin  Hospital Course:  1  right-sided pubic ramus fracture Nonoperative per orthopedics.  Secondary to mechanical fall.  Medications adjusted for better pain control.  Patient noted to have some intolerance to IV morphine for severe pain due to nausea and vomiting.  Patient noted not to be able to take NSAIDs which have been discontinued.  Patient still rates her pain a 8/10 after pain medication, still needing some IV morphine with dose has been decreased.  Patient noted to be declining use of oxycodone early on in the hospitalization  however is in agreement for short trial for acute pain control.  Patient unable to take NSAIDs secondary to prior fundoplication.  Patient was maintained on Flexeril as needed, Robaxin discontinued.  Patient also placed on scheduled Tylenol 3 times daily, Neurontin, Cymbalta.  Due to concerns for ongoing confusion per husband oxycodone was discontinued.  Patient's pain was controlled. Patient seen in consultation by Dr. Lorin Mercy orthopedics who  recommended nonoperative management with outpatient follow-up post discharge in 1 to 2 weeks.  Follow.  2.  Mild ileus Likely secondary to immobility and from pain medications.    Resolved.  Patient placed on a bowel regimen.  Outpatient follow-up.    3.  Dysphagia/??  Candida esophagitis/oral candidiasis Patient with complaints of food getting stuck in the esophagus the morning of 12/27/2019.  Patient with history of hiatal hernia status post fundoplication.  Patient was on clear liquid diet.  Patient seen by GI and it was felt per GI patient had oral candidiasis and felt concern for Candida esophagitis and patient subsequently started on fluconazole empirically per GI recommendations to complete a 3-week course.  Patient diet was advanced to a soft diet which he tolerated.  Patient improved clinically.  Patient be discharged on 17 more days of fluconazole to complete a 3-week course of treatment.  Outpatient follow-up with GI.   4.  Hypertension/coronary artery disease Patient was maintained on home regimen of Norvasc, Lopressor, aspirin.  Outpatient follow-up.    5.  Hyponatremia Likely secondary to tea/toast Potomania from drinking too much fluid.  Patient fluid restricted to 1500 cc/day.  Sodium levels improved.  Outpatient follow-up.  Patient be discharged on a 1500 cc/day fluid restriction.    6.  Severe constipation Patient received sorbitol with good effects early on in the hospitalization.  Patient with hard bowel movement on 12/29/2019, with some  streaks of blood noted.    Patient maintained on MiraLAX twice daily, Senokot-S twice daily.  Patient received soapsuds enema and Dulcolax suppository with good results.  Patient will be discharged on a bowel regimen.  Outpatient follow-up.   7.  Hiatial hernia with fundoplication No NSAIDs.    Patient was maintained on PPI and Carafate.  Outpatient follow-up with GI.   8.  Impaired glucose tolerance Outpatient follow-up with PCP.  9.  Prior spinal stenosis status post kyphoplasty in the remote past with chronic pain Patient was maintained on scheduled gabapentin, salmon calcitonin presumably for osteoporotic fracture in the outpatient setting.  Resume Prolia on discharge.  Outpatient follow-up.   10.  Leukocytosis Felt likely steroid-induced.  Steroids discontinued.  Resolved. Follow.  11.  Lower abdominal discomfort Concern for urinary retention.  I and O cath with 800 cc of urine output on 12/26/2019.  Patient with good urine output since I&O cath.  No further work-up needed.  Outpatient follow-up.    12.  Transaminitis Concern for past common bile duct stone.  Patient noted to have elevated GGT.  Patient on admission noted to have a  significantly elevated LFTs.  MRCP done 12/27/2019 with mild intrahepatic and moderate extrahepatic biliary ductal dilatation unchanged from prior CT of 06/2019.  No evidence of choledocholithiasis.  GI ordered hepatic panel which showed improvement with LFTs.  ANA, anti-smooth muscle antibody, antimitochondrial antibodies pending.  GI was following and recommended no further inpatient work-up with outpatient follow-up with her primary gastroenterologist, Dr. Havery Moros.  Patient will follow with Dr. Havery Moros 2 to 3 weeks post discharge..    Procedures:  CT pelvis 12/20/2019  Abdominal x-ray 12/24/2019  Chest x-ray 12/20/2019  MRCP 12/27/2019  Consultations:  Orthopedics: Dr. Lorin Mercy 12/21/2019  GI: Dr. Hilarie Fredrickson   Discharge Exam: Vitals:    12/30/19 2014 12/31/19 1326  BP: 133/78 123/80  Pulse: 80 72  Resp: 18 20  Temp: 98 F (36.7 C) 97.7 F (36.5 C)  SpO2: 93% 96%    General: NAD Cardiovascular: RRR Respiratory: CTAB  Discharge Instructions   Discharge Instructions    Diet - low sodium heart healthy   Complete by: As directed    Soft diet. 1500cc/day fluid restriction   Increase activity slowly   Complete by: As directed      Allergies as of 12/31/2019      Reactions   Ace Inhibitors Cough   Per Dr Melvyn Novas pulmonology 2013   Aspartame Diarrhea, Nausea And Vomiting   Atorvastatin Other (See Comments)   Muscle aches   Biaxin [clarithromycin] Other (See Comments)   Unknown reaction : PATIENT CAN TOLERATE Z-PAK.  Is written on patient's paper chart.   Daliresp [roflumilast] Nausea And Vomiting   Erythromycin Other (See Comments)   Unknown reaction: PATIENT CAN TOLERATE Z-PAK.  It is written on patient's paper chart.   Levofloxacin Nausea And Vomiting      Medication List    STOP taking these medications   baclofen 10 MG tablet Commonly known as: LIORESAL   HYDROcodone-acetaminophen 5-325 MG tablet Commonly known as: NORCO/VICODIN   oxybutynin 10 MG 24 hr tablet Commonly known as: DITROPAN-XL   predniSONE 20 MG tablet Commonly known as: DELTASONE     TAKE these medications   acetaminophen 500 MG tablet Commonly known as: TYLENOL Take 1 tablet (500 mg total) by mouth 3 (three) times daily for 5 days. What changed:   when to take this  reasons to take this   ALPRAZolam 0.5 MG tablet Commonly known as: XANAX Take 0.5-1 tablets (0.25-0.5 mg total) by mouth daily as needed for anxiety. Take 1/2 tablet 1- 2 x /day ONLY if needed for Anxiety Attack &  limit to 5 days /week to avoid Addiction - DO NOT take with Hydrocodone or Codeine   amLODipine 10 MG tablet Commonly known as: NORVASC Take 1 tablet Daily for BP & Heart   aspirin EC 81 MG tablet Take 81 mg by mouth at bedtime.   Bevespi  Aerosphere 9-4.8 MCG/ACT Aero Generic drug: Glycopyrrolate-Formoterol Inhale 1 puff into the lungs daily.   bisacodyl 10 MG suppository Commonly known as: DULCOLAX Place 1 suppository (10 mg total) rectally as needed for moderate constipation.   cyclobenzaprine 5 MG tablet Commonly known as: FLEXERIL Take 1 tablet (5 mg total) by mouth 3 (three) times daily as needed for muscle spasms.   denosumab 60 MG/ML Sosy injection Commonly known as: PROLIA Inject 60 mg into the skin every 6 (six) months.   diclofenac sodium 1 % Gel Commonly known as: VOLTAREN Apply 2 g topically 4 (four) times daily. What changed:   when to take this  reasons to take this   dicyclomine 20 MG tablet Commonly known as: BENTYL Take 1 tablet (20 mg total) by mouth 3 (three) times daily as needed for spasms. Take 1 tablet 3 x  /day before meals for Cramping, Nausea or Diarhea What changed:   how much to take  how to take this  when to take this  reasons to take this   DULoxetine 30 MG capsule Commonly known as: Cymbalta Start taking 1 cap daily, for mood and chronic pain; increase to 2 caps in 1 month if tolerating well.   fluconazole 200 MG tablet Commonly known as: DIFLUCAN Take 1 tablet (200 mg total) by mouth daily for 17 days. Start taking on: January 01, 2020   Fluticasone-Salmeterol 250-50 MCG/DOSE Aepb Commonly known as: Advair Diskus Inhale 1 puff into the lungs in the morning and at bedtime.   gabapentin 300 MG capsule Commonly known as: NEURONTIN Take 1 capsule (300 mg total) by mouth 4 (four) times daily. What changed:   when to take this  reasons to take this  Another medication with the same name was removed. Continue taking this medication, and follow the directions you see here.   ipratropium-albuterol 0.5-2.5 (3) MG/3ML Soln Commonly known as: DUONEB Take 3 mLs by nebulization every 6 (six) hours as needed (shortness of breath/wheezing).   ketoconazole 2 %  cream Commonly known as: NIZORAL Apply 1 application topically 2 x /day What changed:   how much to take  how to take this  when to take this   Magnesium 400 MG Tabs Take 400 mg by mouth 2 (two) times daily.   meclizine 25 MG tablet Commonly known as: ANTIVERT Take 1/2 to 1 tablet 2 to 3 x /day ONLY if needed  For Dizziness / Vertigo What changed:   how much to take  how to take this  when to take this  reasons to take this   menthol-cetylpyridinium 3 MG lozenge Commonly known as: CEPACOL Take 1 lozenge (3 mg total) by mouth as needed for sore throat.   metoprolol tartrate 50 MG tablet Commonly known as: LOPRESSOR Take 1 tablet 2 x /day for BP & Heart What changed:   how much to take  how to take this  when to take this  additional instructions   multivitamin with minerals tablet Take 1 tablet by mouth daily.   nitroGLYCERIN 0.4 MG SL tablet Commonly known as: NITROSTAT Place 1 tablet (0.4 mg total) under the tongue every 5 (five) minutes as needed for chest pain.   omeprazole 40 MG capsule Commonly known as: PRILOSEC Take 1 capsule 2 x /day for Indigestion , Heartburn & Nausea What changed:   how much to take  how to take this  when to take this   polyethylene glycol powder 17 GM/SCOOP powder Commonly known as: GLYCOLAX/MIRALAX Take 17 g by mouth 2 (two) times daily. What changed: when to take this   prochlorperazine 5 MG tablet Commonly known as: COMPAZINE Take 1 tablet (5 mg total) by mouth every 6 (six) hours as needed for refractory nausea / vomiting. What changed:   medication strength  how much to take  how to take this  when to take this  reasons to take this  additional instructions   promethazine 25 MG tablet Commonly known as: PHENERGAN Take 1 tablet every 4 hours if needed for Nausea or Vomitting   rOPINIRole 2 MG tablet Commonly known as: REQUIP Take 1 tablet (2 mg total) by  mouth 3 (three) times daily. What  changed:   medication strength  how much to take  how to take this  when to take this  additional instructions   senna-docusate 8.6-50 MG tablet Commonly known as: Senokot-S Take 2 tablets by mouth 2 (two) times daily.   sucralfate 1 g tablet Commonly known as: CARAFATE Take 1 tablet 4 x /day before Meals & Bedtime for Indigestion & Heartburn What changed:   how much to take  how to take this  when to take this   traMADol 50 MG tablet Commonly known as: ULTRAM Take 2 tablets (100 mg total) by mouth every 6 (six) hours.   trospium 20 MG tablet Commonly known as: SANCTURA Take 20 mg by mouth 2 (two) times daily.   vitamin B-12 1000 MCG tablet Commonly known as: CYANOCOBALAMIN Take 1,000 mcg by mouth daily.   Vitamin D3 125 MCG (5000 UT) Caps Take 5,000 Units by mouth 2 (two) times daily.      Allergies  Allergen Reactions  . Ace Inhibitors Cough    Per Dr Melvyn Novas pulmonology 2013  . Aspartame Diarrhea and Nausea And Vomiting  . Atorvastatin Other (See Comments)    Muscle aches  . Biaxin [Clarithromycin] Other (See Comments)    Unknown reaction : PATIENT CAN TOLERATE Z-PAK.  Is written on patient's paper chart.  [name] [Roflumilast] Nausea And Vomiting  . Erythromycin Other (See Comments)    Unknown reaction: PATIENT CAN TOLERATE Z-PAK.  It is written on patient's paper chart.  . Levofloxacin Nausea And Vomiting    Contact information for follow-up providers    Marybelle Killings, MD Follow up in 2 week(s).   Specialty: Orthopedic Surgery Contact information: [place] [address] [phone]        Yetta Flock, MD Follow up.   Specialty: Gastroenterology Why: f/u in 2-3 weeks. Contact information: [place] [address] [phone]            Contact information for after-discharge care    Destination    [facility] .   Service: Skilled Nursing Contact  information: [place] [phone]                   The results of significant diagnostics from this hospitalization (including imaging, microbiology, ancillary and laboratory) are listed below for reference.    Significant Diagnostic Studies: DG Chest 2 View  Result Date: 12/20/2019 CLINICAL DATA:  Chest pain.  Fall. EXAM: CHEST - 2 VIEW COMPARISON:  Chest radiograph 11/29/2019, CT 04/29/2019 FINDINGS: Mild chronic cardiomegaly. Unchanged mediastinal contours. Improved linear opacity at the right lung base. No acute airspace disease. No pneumothorax or large pleural effusion. No pulmonary edema. Degenerative change in the left shoulder. Kyphoplasty in the lower thoracic spine is partially visualized. IMPRESSION: No acute chest findings. Mild chronic cardiomegaly. Electronically Signed   By: Keith Rake M.D.   On: 12/20/2019 22:20   CT PELVIS WO CONTRAST  Result Date: 12/20/2019 CLINICAL DATA:  Fall, right hip pain EXAM: CT PELVIS WITHOUT CONTRAST TECHNIQUE: Multidetector CT imaging of the pelvis was performed following the standard protocol without intravenous contrast. COMPARISON:  Plain films today FINDINGS: Urinary Tract:  No abnormality visualized. Bowel:  Unremarkable visualized pelvic bowel loops. Vascular/Lymphatic: Aortic and iliac calcifications. No aneurysm or adenopathy. Reproductive:  Prior hysterectomy.  No adnexal masses. Other:  No free fluid or free air. Musculoskeletal: There  is a minimally displaced fracture through the right inferior pubic ramus. Nondisplaced fracture also seen within the ischium at the anteromedial acetabulum. No proximal femoral fracture. No subluxation or dislocation. IMPRESSION: Fractures through the right inferior pubic ramus and right ischium/anteromedial acetabulum. No proximal femoral abnormality. Electronically Signed   By: Rolm Baptise M.D.   On: 12/20/2019 20:09   MR 3D Recon At  Scanner  Result Date: 12/27/2019 CLINICAL DATA:  Abdominal pain, biliary obstruction EXAM: MRI ABDOMEN WITHOUT AND WITH CONTRAST (INCLUDING MRCP) TECHNIQUE: Multiplanar multisequence MR imaging of the abdomen was performed both before and after the administration of intravenous contrast. Heavily T2-weighted images of the biliary and pancreatic ducts were obtained, and three-dimensional MRCP images were rendered by post processing. CONTRAST:  69mL GADAVIST GADOBUTROL 1 MMOL/ML IV SOLN COMPARISON:  CT abdomen 06/10/2019 FINDINGS: The examination is slightly degraded by motion artifact. Lower chest: Moderate hiatal hernia. Mild cardiomegaly. Trace left pleural effusion. Hepatobiliary: Simple cysts are seen within segment 4 B and segment 3 of the liver. No focal intrahepatic masses are identified. Mild intrahepatic and moderate extrahepatic biliary ductal dilation is unchanged from prior CT examination possibly representing post cholecystectomy change. No intraluminal filling defect is identified. Cholecystectomy has been performed. Pancreas: Imaging is degraded by motion artifact. No intrapancreatic lesions are seen. The pancreatic duct is not dilated. No pancreatic divisum Spleen:  Unremarkable Adrenals/Urinary Tract: The adrenal glands are unremarkable. Simple cortical cyst noted arising exophytically from the interpolar region of the left kidney. The kidneys are otherwise unremarkable. Stomach/Bowel: Unremarkable Vascular/Lymphatic: No pathologic adenopathy within the abdomen. No abdominal aortic aneurysm. Superior mesenteric vein, splenic vein, and portal vein are patent. Renal veins and suprarenal inferior vena cava are unremarkable. Other: None significant Musculoskeletal: Susceptibility artifact secondary to lumbar fusion hardware is noted. Multiple compression fractures are seen within the a superior lumbar spine with areas of signal loss likely related to probable prior vertebroplasty. IMPRESSION: 1. Mild  intrahepatic and moderate extrahepatic biliary ductal dilation is unchanged from prior CT examination possibly representing post cholecystectomy change. No intraluminal filling defect is identified. 2. No acute findings in the abdomen. 3. Moderate hiatal hernia. 4. Trace left pleural effusion. Electronically Signed   By: Fidela Salisbury MD   On: 12/27/2019 21:33   DG Abd 2 Views  Result Date: 12/24/2019 CLINICAL DATA:  Nausea and vomiting EXAM: ABDOMEN - 2 VIEW COMPARISON:  04/12/2018 FINDINGS: Mildly distended loops of large and small bowel. No free air identified. Small air-fluid levels noted. Multilevel vertebroplasty in the thoracic and lumbar spine. Pedicle screw fusion L1 through L3. IMPRESSION: Mild ileus Electronically Signed   By: Franchot Gallo M.D.   On: 12/24/2019 11:33   MR ABDOMEN MRCP W WO CONTAST  Result Date: 12/27/2019 CLINICAL DATA:  Abdominal pain, biliary obstruction EXAM: MRI ABDOMEN WITHOUT AND WITH CONTRAST (INCLUDING MRCP) TECHNIQUE: Multiplanar multisequence MR imaging of the abdomen was performed both before and after the administration of intravenous contrast. Heavily T2-weighted images of the biliary and pancreatic ducts were obtained, and three-dimensional MRCP images were rendered by post processing. CONTRAST:  59mL GADAVIST GADOBUTROL 1 MMOL/ML IV SOLN COMPARISON:  CT abdomen 06/10/2019 FINDINGS: The examination is slightly degraded by motion artifact. Lower chest: Moderate hiatal hernia. Mild cardiomegaly. Trace left pleural effusion. Hepatobiliary: Simple cysts are seen within segment 4 B and segment 3 of the liver. No focal intrahepatic masses are identified. Mild intrahepatic and moderate extrahepatic biliary ductal dilation is unchanged from prior CT examination possibly representing post cholecystectomy change. No  intraluminal filling defect is identified. Cholecystectomy has been performed. Pancreas: Imaging is degraded by motion artifact. No intrapancreatic lesions are  seen. The pancreatic duct is not dilated. No pancreatic divisum Spleen:  Unremarkable Adrenals/Urinary Tract: The adrenal glands are unremarkable. Simple cortical cyst noted arising exophytically from the interpolar region of the left kidney. The kidneys are otherwise unremarkable. Stomach/Bowel: Unremarkable Vascular/Lymphatic: No pathologic adenopathy within the abdomen. No abdominal aortic aneurysm. Superior mesenteric vein, splenic vein, and portal vein are patent. Renal veins and suprarenal inferior vena cava are unremarkable. Other: None significant Musculoskeletal: Susceptibility artifact secondary to lumbar fusion hardware is noted. Multiple compression fractures are seen within the a superior lumbar spine with areas of signal loss likely related to probable prior vertebroplasty. IMPRESSION: 1. Mild intrahepatic and moderate extrahepatic biliary ductal dilation is unchanged from prior CT examination possibly representing post cholecystectomy change. No intraluminal filling defect is identified. 2. No acute findings in the abdomen. 3. Moderate hiatal hernia. 4. Trace left pleural effusion. Electronically Signed   By: Fidela Salisbury MD   On: 12/27/2019 21:33   DG Hip Unilat With Pelvis 2-3 Views Right  Result Date: 12/20/2019 CLINICAL DATA:  84 year old female with fall and hip pain. EXAM: DG HIP (WITH OR WITHOUT PELVIS) 2-3V RIGHT COMPARISON:  CT of the abdomen pelvis dated 06/10/2019. FINDINGS: There is no acute fracture or dislocation. The bones are osteopenic. There is mild to moderate arthritic changes of the hips bilaterally. There is degenerative changes of the lower lumbar spine. L4 vertebroplasty. The soft tissues are unremarkable. IMPRESSION: No acute fracture or dislocation. Electronically Signed   By: Anner Crete M.D.   On: 12/20/2019 18:34    Microbiology: Recent Results (from the past 240 hour(s))  SARS Coronavirus 2 by RT PCR (hospital order, performed in [facility] hospital lab)  Nasopharyngeal Nasopharyngeal Swab     Status: None   Collection Time: 12/30/19  1:13 PM   Specimen: Nasopharyngeal Swab  Result Value Ref Range Status   SARS Coronavirus 2 NEGATIVE NEGATIVE Final    Comment: (NOTE) SARS-CoV-2 target nucleic acids are NOT DETECTED.  The SARS-CoV-2 RNA is generally detectable in upper and lower respiratory specimens during the acute phase of infection. The lowest concentration of SARS-CoV-2 viral copies this assay can detect is 250 copies / mL. A negative result does not preclude SARS-CoV-2 infection and should not be used as the sole basis for treatment or other patient management decisions.  A negative result may occur with improper specimen collection / handling, submission of specimen other than nasopharyngeal swab, presence of viral mutation(s) within the areas targeted by this assay, and inadequate number of viral copies (<250 copies / mL). A negative result must be combined with clinical observations, patient history, and epidemiological information.  Fact Sheet for Patients:   StrictlyIdeas.no  Fact Sheet for Healthcare Providers: [email or web address]  This test is not yet approved or  cleared by the [place] FDA and has been authorized for detection and/or diagnosis of SARS-CoV-2 by FDA under an Emergency Use Authorization (EUA).  This EUA will remain in effect (meaning this test can be used) for the duration of the COVID-19 declaration under Section 564(b)(1) of the Act, 21 U.S.C. section 360bbb-3(b)(1), unless the authorization is terminated or revoked sooner.  Performed at [facility], [place] [address], [place], [place] [address]      Labs: Basic Metabolic Panel: Recent Labs  Lab 12/27/19 0518 12/28/19 0762 12/29/19 0524 12/30/19 0612 12/31/19 0832  NA 133* 134*  132* 133* 130*  K 4.3 4.3 4.5 4.3 4.5  CL 98 100 98 98 96*  CO2 25 25 25 24 22   GLUCOSE 117*  97 105* 101* 93  BUN 18 15 13 15 13   CREATININE 0.82 0.62 0.64 0.71 0.64  CALCIUM 9.2 8.8* 8.9 9.0 8.8*   Liver Function Tests: Recent Labs  Lab 12/26/19 0542 12/27/19 0518 12/28/19 0611 12/29/19 0524 12/30/19 0612  AST 18 14* 13* 13* 15  ALT 95* 64* 46* 36 31  ALKPHOS 173* 151* 136* 123 116  BILITOT 1.0 1.0 0.5 0.7 0.7  PROT 6.7 6.5 6.8 6.5 6.5  ALBUMIN 3.4* 3.4* 3.3* 3.2* 3.3*   No results for input(s): LIPASE, AMYLASE in the last 168 hours. No results for input(s): AMMONIA in the last 168 hours. CBC: Recent Labs  Lab 12/25/19 0601 12/25/19 0601 12/26/19 0542 12/26/19 0542 12/28/19 4128 12/29/19 0524 12/29/19 1854 12/30/19 0612 12/31/19 0549  WBC 10.3  --  11.2*  --  9.9 7.4  --  8.6  --   NEUTROABS 6.4  --  7.2  --  6.8  --   --   --   --   HGB 14.7   < > 14.4   < > 14.1 13.4 13.9 13.1 14.0  HCT 46.0   < > 44.8   < > 42.8 41.7 42.6 41.1 44.2  MCV 97.0  --  97.0  --  96.4 97.4  --  96.9  --   PLT 267  --  265  --  321 318  --  349  --    < > = values in this interval not displayed.   Cardiac Enzymes: No results for input(s): CKTOTAL, CKMB, CKMBINDEX, TROPONINI in the last 168 hours. BNP: BNP (last 3 results) No results for input(s): BNP in the last 8760 hours.  ProBNP (last 3 results) No results for input(s): PROBNP in the last 8760 hours.  CBG: No results for input(s): GLUCAP in the last 168 hours.     Signed:  Irine Seal MD.  [facility] 12/31/2019, 2:36 PM

## 2019-12-31 NOTE — TOC Progression Note (Signed)
Transition of Care [facility]) - Progression Note    Patient Details  Name: [name] [name] MRN: [id number] Date of Birth: 1936-04-11  [facility] CM/SW Contact  Iven Earnhart, Juliann Pulse, RN Phone Number: 12/31/2019, 2:27 PM  Clinical Narrative: awaiting medical stablility for d/c to SNF[facility] auth ends 8/25 [id number]. PTAR VQWQ#[id number]. Going to rm 3217, nsg call report [phone].      Expected Discharge Plan: [place] Barriers to Discharge: Continued Medical Work up  Expected Discharge Plan and Services Expected Discharge Plan: [place]   Discharge Planning Services: CM Consult Post Acute Care Choice: [place] Living arrangements for the past 2 months: Single Family Home                                       Social Determinants of Health (SDOH) Interventions    Readmission Risk Interventions No flowsheet data found.

## 2020-01-01 ENCOUNTER — Telehealth: Payer: Self-pay | Admitting: *Deleted

## 2020-01-01 DIAGNOSIS — G459 Transient cerebral ischemic attack, unspecified: Secondary | ICD-10-CM | POA: Diagnosis not present

## 2020-01-01 DIAGNOSIS — K210 Gastro-esophageal reflux disease with esophagitis: Secondary | ICD-10-CM | POA: Diagnosis not present

## 2020-01-01 DIAGNOSIS — E785 Hyperlipidemia, unspecified: Secondary | ICD-10-CM | POA: Diagnosis not present

## 2020-01-01 DIAGNOSIS — K5790 Diverticulosis of intestine, part unspecified, without perforation or abscess without bleeding: Secondary | ICD-10-CM | POA: Diagnosis not present

## 2020-01-01 DIAGNOSIS — F419 Anxiety disorder, unspecified: Secondary | ICD-10-CM | POA: Diagnosis not present

## 2020-01-01 DIAGNOSIS — S3282XD Multiple fractures of pelvis without disruption of pelvic ring, subsequent encounter for fracture with routine healing: Secondary | ICD-10-CM | POA: Diagnosis not present

## 2020-01-01 DIAGNOSIS — I10 Essential (primary) hypertension: Secondary | ICD-10-CM | POA: Diagnosis not present

## 2020-01-01 DIAGNOSIS — R3912 Poor urinary stream: Secondary | ICD-10-CM | POA: Diagnosis not present

## 2020-01-01 DIAGNOSIS — N183 Chronic kidney disease, stage 3 unspecified: Secondary | ICD-10-CM | POA: Diagnosis not present

## 2020-01-01 DIAGNOSIS — I2510 Atherosclerotic heart disease of native coronary artery without angina pectoris: Secondary | ICD-10-CM | POA: Diagnosis not present

## 2020-01-01 DIAGNOSIS — R110 Nausea: Secondary | ICD-10-CM | POA: Diagnosis not present

## 2020-01-01 NOTE — Telephone Encounter (Signed)
Spouse called and reported the patient has been admitted to [facility] skilled care unit for rehabilitation, due to fractured pelvis. Spouse is concerned because he cannot see the patient, due to Covid. Suggested the spouse call [facility]ot', speak to the nurse taking care of her and asked the house doctor to call him, to discuss her state of mind before her fall. He was advised to take her cell phone in a bag to the facility so he can speak with the patient. Spouse agreed to all.

## 2020-01-01 NOTE — Progress Notes (Signed)
Transport services at bedside.  [facility] disconnected and patient has on mesh panties with kotex for transfer as well as blue gown.  Red bag with clothing taken with patient.  No IV access, Black brace for right wrist with patient as well.  All night medications given prior to discharge.  No c/o pain voiced upon discharge.

## 2020-01-02 ENCOUNTER — Telehealth: Payer: Self-pay | Admitting: *Deleted

## 2020-01-02 DIAGNOSIS — I10 Essential (primary) hypertension: Secondary | ICD-10-CM | POA: Diagnosis not present

## 2020-01-02 DIAGNOSIS — E785 Hyperlipidemia, unspecified: Secondary | ICD-10-CM | POA: Diagnosis not present

## 2020-01-02 DIAGNOSIS — W06XXXA Fall from bed, initial encounter: Secondary | ICD-10-CM | POA: Diagnosis not present

## 2020-01-02 DIAGNOSIS — N183 Chronic kidney disease, stage 3 unspecified: Secondary | ICD-10-CM | POA: Diagnosis not present

## 2020-01-02 DIAGNOSIS — F419 Anxiety disorder, unspecified: Secondary | ICD-10-CM | POA: Diagnosis not present

## 2020-01-02 DIAGNOSIS — S3282XD Multiple fractures of pelvis without disruption of pelvic ring, subsequent encounter for fracture with routine healing: Secondary | ICD-10-CM | POA: Diagnosis not present

## 2020-01-02 NOTE — Telephone Encounter (Signed)
Entered in error

## 2020-01-02 NOTE — Telephone Encounter (Signed)
Spouse called and reported he is taking the patient home from [facility], after being admitted to [facility] on the evening of 12/31/2019. Spouse is requesting a home health referral. It was explained, in order for [place] to do that, Medicare requires an in person visit, which the patient cannot do, due to her conditio. Spouse was advised to contact the facility social worker and discuss her arranging for the discharge summary to include her needs for home health and do a referral for nursing evaluation, special equipment and other needs the patient will require. Spouse is aware and will contact the facility.

## 2020-01-04 ENCOUNTER — Other Ambulatory Visit: Payer: Self-pay

## 2020-01-04 DIAGNOSIS — R7989 Other specified abnormal findings of blood chemistry: Secondary | ICD-10-CM

## 2020-01-04 NOTE — Progress Notes (Signed)
Pt due for LFTs in sept 2021

## 2020-01-07 ENCOUNTER — Telehealth: Payer: Self-pay | Admitting: Orthopaedic Surgery

## 2020-01-07 ENCOUNTER — Ambulatory Visit (INDEPENDENT_AMBULATORY_CARE_PROVIDER_SITE_OTHER): Payer: PPO | Admitting: Adult Health Nurse Practitioner

## 2020-01-07 ENCOUNTER — Other Ambulatory Visit: Payer: Self-pay

## 2020-01-07 ENCOUNTER — Ambulatory Visit: Payer: PPO | Admitting: Orthopaedic Surgery

## 2020-01-07 ENCOUNTER — Encounter: Payer: Self-pay | Admitting: Adult Health Nurse Practitioner

## 2020-01-07 VITALS — BP 140/74 | HR 97 | Temp 97.9°F

## 2020-01-07 DIAGNOSIS — J441 Chronic obstructive pulmonary disease with (acute) exacerbation: Secondary | ICD-10-CM

## 2020-01-07 DIAGNOSIS — Z9181 History of falling: Secondary | ICD-10-CM | POA: Diagnosis not present

## 2020-01-07 DIAGNOSIS — D692 Other nonthrombocytopenic purpura: Secondary | ICD-10-CM

## 2020-01-07 DIAGNOSIS — W19XXXD Unspecified fall, subsequent encounter: Secondary | ICD-10-CM

## 2020-01-07 DIAGNOSIS — M62838 Other muscle spasm: Secondary | ICD-10-CM | POA: Diagnosis not present

## 2020-01-07 DIAGNOSIS — I10 Essential (primary) hypertension: Secondary | ICD-10-CM

## 2020-01-07 DIAGNOSIS — S32591D Other specified fracture of right pubis, subsequent encounter for fracture with routine healing: Secondary | ICD-10-CM

## 2020-01-07 MED ORDER — METHOCARBAMOL 500 MG PO TABS: 500.0000 mg | ORAL_TABLET | Freq: Four times a day (QID) | ORAL | 1 refills | Status: DC

## 2020-01-07 NOTE — Telephone Encounter (Signed)
Katrina with [facility] called wanting to give [place] a heads up the pt may be no showing her 01/07/20 appt

## 2020-01-07 NOTE — Progress Notes (Signed)
Hospital follow up  Assessment and Plan: Hospital visit follow up for Closed fracture: Fractures through the right inferior pubic ramus and right ischium/anteromedial acetabulum.   [name] [name] was seen today for follow-up.  Diagnoses and all orders for this visit:  Muscle spasm of right lower extremity D/C flexeril relate dto intolerance, makes sleepy -     methocarbamol (ROBAXIN) 500 MG tablet; Take 1 tablet (500 mg total) by mouth 4 (four) times daily.  Senile purpura (HCC) Discussed process, protect skin, sunscreen  COPD exacerbation (HCC) No triggers, well controlled symptoms, cont to monitor  Essential hypertension -     CBC with Differential/Platelet -     COMPLETE METABOLIC PANEL WITH GFR  Fall, subsequent encounter Closed fracture of right inferior pubic ramus with routine healing, subsequent encounter Pain well controlled LBM: this am Ambulating, min weight bearing at this time Need for home PT/OT, will place order today  At high risk for falls Referral for home health PT/OT  Contact office with any new or worsening symptoms   All medications were reviewed with patient and family and fully reconciled. All questions answered fully, and patient and family members were encouraged to call the office with any further questions or concerns. Discussed goal to avoid readmission related to this diagnosis.  There are no discontinued medications.  CAN NOT DO FOR BCBS REGULAR OR MEDICARE Over 40 minutes of exam, counseling, chart review, and complex, high/moderate level critical decision making was performed this visit.   Future Appointments  Date Time Provider [place]  01/22/2020 10:30 AM Vicie Mutters, PA-C [facility] None  02/18/2020 10:15 AM [name], MD [facility] None  03/05/2020  3:00 PM Armbruster, Carlota Raspberry, MD [facility]  07/21/2020 10:30 AM [name], NP [facility] None  10/15/2020 10:00 AM [name], NP [facility] None      HPI 84 y.o.female presents for follow up for transition from recent hospitalization or SNIF stay. Admit date to the hospital was 12/20/19, patient was discharged from the hospital on 12/31/19 and our clinical staff contacted the office the day after discharge to set up a follow up appointment. The discharge summary, medications, and diagnostic test results were reviewed before meeting with the patient. The patient was admitted for:   Home health is not involved at this point related to discharge from [facility].  Do not have records at this time for review.  She was admitted to [facility], she had a fall in the night and sone repots he did not see her completing therapy and wanted her to come home.  Reports they were discharged against medical advise.  She is now at home and he reports that her husband is doing physical therapy.  He would like some in home therapy to assist her with increasing her activity.  She is ambulating with four prong walker.  She is here today in wheelchair related to the distance to our office.   They have a raised toilet seat and shower bench for her to use at home.  Her appetite is improving since being in the hospital.  Her husband reports that she is drinking boost between meals to help.    She is having labored breathing in office today but reports it is from the pain.  She denies any shortness of breath, dyspnea on exertion or when laying down. She is using inhaler daily.  She is taking tramadol 50mg  and tylenol 1,000mg  BID.  She reports this is working well for pain management.     Ortho:  Dr Lorin Mercy Due for follow up, to call to schedule this.  IMPRESSION: Fractures through the right inferior pubic ramus and right ischium/anteromedial acetabulum. No proximal femoral abnormality.   GI: Dr Havery Moros 03/05/20  HPI from hospital admission 12/20/19 [name] [name]  is a 84 y.o. female, TIA, hypertension, hyperlipidemia, GERD, CKD 3, CAD, anxiety, and more presents to  the ED with chief complaint of fall.  Patient reports that she was going up stairs steps at her home with a glass of tea, and the next and she knew she was on the ground.  Patient reports that she landed on her right hip, left fifth digit, on cement ground.  She had instant pain and required the help of 2 people to get up to a chair until the paramedics came.  Patient reports that she has otherwise been in her normal state of health.  Prior to the fall she does not remember any chest pain, dyspnea, palpitations, or dizziness.  She reports that she does not think she lost consciousness.  She did not hit her head.  Patient is not on any blood thinners.  Patient does not remember tripping, but it was about a 6 inch tall step that she was attempting to navigate when she fell.  Patient reports that her pain was 10 out of 10.  Came down with treatment in the ER.  Her pain is creeping back up to 7-8 out of 10 at the time of my exam.  She describes it as sharp and achy.  Patient has no other complaints at this time.  Patient denies chest pain at this time - despite current treatment outpatient for "chest wall pain." Per chart review - this pain was subsequent to a fall one week ago. She did have a negative CXR with clear lungs at that time. Patient preferred to avoid "strong narcotics" at that time, and so short term prednisone was ordered. Near to the end of her course, patient reported that she felt better, but not 100%, so she requested a second course of prednisone. The first started Jul29, the second Aug 3.   Images while in the hospital: DG Chest 2 View  Result Date: 12/20/2019 CLINICAL DATA:  Chest pain.  Fall. EXAM: CHEST - 2 VIEW COMPARISON:  Chest radiograph 11/29/2019, CT 04/29/2019 FINDINGS: Mild chronic cardiomegaly. Unchanged mediastinal contours. Improved linear opacity at the right lung base. No acute airspace disease. No pneumothorax or large pleural effusion. No pulmonary edema. Degenerative  change in the left shoulder. Kyphoplasty in the lower thoracic spine is partially visualized. IMPRESSION: No acute chest findings. Mild chronic cardiomegaly. Electronically Signed   By: Keith Rake M.D.   On: 12/20/2019 22:20   CT PELVIS WO CONTRAST  Result Date: 12/20/2019 CLINICAL DATA:  Fall, right hip pain EXAM: CT PELVIS WITHOUT CONTRAST TECHNIQUE: Multidetector CT imaging of the pelvis was performed following the standard protocol without intravenous contrast. COMPARISON:  Plain films today FINDINGS: Urinary Tract:  No abnormality visualized. Bowel:  Unremarkable visualized pelvic bowel loops. Vascular/Lymphatic: Aortic and iliac calcifications. No aneurysm or adenopathy. Reproductive:  Prior hysterectomy.  No adnexal masses. Other:  No free fluid or free air. Musculoskeletal: There is a minimally displaced fracture through the right inferior pubic ramus. Nondisplaced fracture also seen within the ischium at the anteromedial acetabulum. No proximal femoral fracture. No subluxation or dislocation. IMPRESSION: Fractures through the right inferior pubic ramus and right ischium/anteromedial acetabulum. No proximal femoral abnormality. Electronically Signed   By: Rolm Baptise M.D.  On: 12/20/2019 20:09   DG Hip Unilat With Pelvis 2-3 Views Right  Result Date: 12/20/2019 CLINICAL DATA:  84 year old female with fall and hip pain. EXAM: DG HIP (WITH OR WITHOUT PELVIS) 2-3V RIGHT COMPARISON:  CT of the abdomen pelvis dated 06/10/2019. FINDINGS: There is no acute fracture or dislocation. The bones are osteopenic. There is mild to moderate arthritic changes of the hips bilaterally. There is degenerative changes of the lower lumbar spine. L4 vertebroplasty. The soft tissues are unremarkable. IMPRESSION: No acute fracture or dislocation. Electronically Signed   By: Anner Crete M.D.   On: 12/20/2019 18:34     Current Outpatient Medications (Endocrine & Metabolic):  .  denosumab (PROLIA) 60 MG/ML SOSY  injection, Inject 60 mg into the skin every 6 (six) months.  Current Facility-Administered Medications (Endocrine & Metabolic):  .  calcitonin (salmon) (MIACALCIN/FORTICAL) nasal spray 1 spray  Current Outpatient Medications (Cardiovascular):  .  amLODipine (NORVASC) 10 MG tablet, Take 1 tablet Daily for BP & Heart .  metoprolol tartrate (LOPRESSOR) 50 MG tablet, Take 1 tablet 2 x /day for BP & Heart (Patient taking differently: Take 25 mg by mouth at bedtime. ) .  nitroGLYCERIN (NITROSTAT) 0.4 MG SL tablet, Place 1 tablet (0.4 mg total) under the tongue every 5 (five) minutes as needed for chest pain.   Current Outpatient Medications (Respiratory):  [name]  Fluticasone-Salmeterol (ADVAIR DISKUS) 250-50 MCG/DOSE AEPB, Inhale 1 puff into the lungs in the morning and at bedtime. .  Glycopyrrolate-Formoterol (BEVESPI AEROSPHERE) 9-4.8 MCG/ACT AERO, Inhale 1 puff into the lungs daily. [name]  ipratropium-albuterol (DUONEB) 0.5-2.5 (3) MG/3ML SOLN, Take 3 mLs by nebulization every 6 (six) hours as needed (shortness of breath/wheezing).  .  promethazine (PHENERGAN) 25 MG tablet, Take 1 tablet every 4 hours if needed for Nausea or Vomitting   Current Outpatient Medications (Analgesics):  .  aspirin EC 81 MG tablet, Take 81 mg by mouth at bedtime.  .  traMADol (ULTRAM) 50 MG tablet, Take 2 tablets (100 mg total) by mouth every 6 (six) hours.   Current Outpatient Medications (Hematological):  .  vitamin B-12 (CYANOCOBALAMIN) 1000 MCG tablet, Take 1,000 mcg by mouth daily.   Current Outpatient Medications (Other):  [name]  ALPRAZolam (XANAX) 0.5 MG tablet, Take 0.5-1 tablets (0.25-0.5 mg total) by mouth daily as needed for anxiety. Take 1/2 tablet 1- 2 x /day ONLY if needed for Anxiety Attack &  limit to 5 days /week to avoid Addiction - DO NOT take with Hydrocodone or Codeine .  bisacodyl (DULCOLAX) 10 MG suppository, Place 1 suppository (10 mg total) rectally as needed for moderate constipation. .   Cholecalciferol (VITAMIN D3) 5000 units CAPS, Take 5,000 Units by mouth 2 (two) times daily.  .  cyclobenzaprine (FLEXERIL) 5 MG tablet, Take 1 tablet (5 mg total) by mouth 3 (three) times daily as needed for muscle spasms. .  diclofenac sodium (VOLTAREN) 1 % GEL, Apply 2 g topically 4 (four) times daily. (Patient taking differently: Apply 2 g topically 4 (four) times daily as needed (pain). ) .  dicyclomine (BENTYL) 20 MG tablet, Take 1 tablet (20 mg total) by mouth 3 (three) times daily as needed for spasms. Take 1 tablet 3 x  /day before meals for Cramping, Nausea or Diarhea .  DULoxetine (CYMBALTA) 30 MG capsule, Start taking 1 cap daily, for mood and chronic pain; increase to 2 caps in 1 month if tolerating well. .  fluconazole (DIFLUCAN) 200 MG tablet, Take 1 tablet (200 mg  total) by mouth daily for 17 days. [name]  gabapentin (NEURONTIN) 300 MG capsule, Take 1 capsule (300 mg total) by mouth 4 (four) times daily. [name]  ketoconazole (NIZORAL) 2 % cream, Apply 1 application topically 2 x /day (Patient taking differently: Apply 1 application topically 2 (two) times daily. Apply 1 application topically 2 x /day) .  Magnesium 400 MG TABS, Take 400 mg by mouth 2 (two) times daily.  .  meclizine (ANTIVERT) 25 MG tablet, Take 1/2 to 1 tablet 2 to 3 x /day ONLY if needed  For Dizziness / Vertigo (Patient taking differently: Take 12.5-25 mg by mouth 3 (three) times daily as needed for dizziness. Take 1/2 to 1 tablet 2 to 3 x /day ONLY if needed  For Dizziness / Vertigo) .  menthol-cetylpyridinium (CEPACOL) 3 MG lozenge, Take 1 lozenge (3 mg total) by mouth as needed for sore throat. .  Multiple Vitamins-Minerals (MULTIVITAMIN WITH MINERALS) tablet, Take 1 tablet by mouth daily. [name]  omeprazole (PRILOSEC) 40 MG capsule, Take 1 capsule 2 x /day for Indigestion , Heartburn & Nausea (Patient taking differently: Take 40 mg by mouth 2 (two) times daily. Take 1 capsule 2 x /day for Indigestion , Heartburn & Nausea) .   polyethylene glycol powder (GLYCOLAX/MIRALAX) powder, Take 17 g by mouth 2 (two) times daily. (Patient taking differently: Take 17 g by mouth daily. ) .  prochlorperazine (COMPAZINE) 5 MG tablet, Take 1 tablet (5 mg total) by mouth every 6 (six) hours as needed for refractory nausea / vomiting. [name]  rOPINIRole (REQUIP) 2 MG tablet, Take 1 tablet (2 mg total) by mouth 3 (three) times daily. [name]  senna-docusate (SENOKOT-S) 8.6-50 MG tablet, Take 2 tablets by mouth 2 (two) times daily. .  sucralfate (CARAFATE) 1 g tablet, Take 1 tablet 4 x /day before Meals & Bedtime for Indigestion & Heartburn (Patient taking differently: Take 1 g by mouth 4 (four) times daily. Take 1 tablet 4 x /day before Meals & Bedtime for Indigestion & Heartburn) .  trospium (SANCTURA) 20 MG tablet, Take 20 mg by mouth 2 (two) times daily. .  methocarbamol (ROBAXIN) 500 MG tablet, Take 1 tablet (500 mg total) by mouth 4 (four) times daily.   Past Medical History:  Diagnosis Date  . Anxiety    takes Xanax daily as needed  . Asthma    Albuterol daily as needed  . Blood transfusion without reported diagnosis   . CAD (coronary artery disease)    LHC 2003 with 40% pLAD, 30% mLAD, 100% D1 (moderate vessel), 100%  D2 (small vessel), 95% mid small OM2.  Pt had PTCA to D1;  D2 and OM2 small vessels and tx medically;  Last Myoview (2012): anterior infarct seen with scar, no ischemia. EF normal. Med Rx // Myoview (12/14):  Low risk; ant scar w/ peri-infarct ischemia; EF 55% with ant AK // Myoview 5/16: EF 55-65, ant, apical scar, no ischemia   . Cataract   . Chronic back pain    HNP, DDD, spinal stenosis, vertebral compression fractures.   . Chronic nausea    takes Zofran daily as needed.  normal gastric emptying study 03/2013  . CKD (chronic kidney disease), stage III 09/27/2017  . Constipation    felt to be functional. takes Senokot and Miralax daily.  treated with Linzess in past.   . DIVERTICULOSIS, COLON 08/28/2008   Qualifier:  Diagnosis of  By: [name], [name], [name]    . Elevated LFTs 2015   probably med induced  DILI, from Augmentin vs Pravastatin  . GERD (gastroesophageal reflux disease)    hx of esophageal stricture   . Hiatal hernia    Paraesophageal hernias. s/p fundoplications in 9509 and 04/2013  . History of bronchitis several of yrs ago  . History of colon polyps 03/2009, 03/2011   adenomatous colon polyps, no high grade dysplasia.   [name] History of vertigo    no meds  . Hyperlipidemia    takes Pravastatin daily  . Hypertension    takes Amlodipine,Metoprolol,and Losartan daily  . Nausea 03/2016  . Osteoporosis   . PONV (postoperative nausea and vomiting)    yrs ago  . Slow urinary stream    takes Rapaflo daily  . TIA (transient ischemic attack) 1995   no residual effects noted     Allergies  Allergen Reactions  . Ace Inhibitors Cough    Per Dr Melvyn Novas pulmonology 2013  . Aspartame Diarrhea and Nausea And Vomiting  . Atorvastatin Other (See Comments)    Muscle aches  . Biaxin [Clarithromycin] Other (See Comments)    Unknown reaction : PATIENT CAN TOLERATE Z-PAK.  Is written on patient's paper chart.  [name] [Roflumilast] Nausea And Vomiting  . Erythromycin Other (See Comments)    Unknown reaction: PATIENT CAN TOLERATE Z-PAK.  It is written on patient's paper chart.  . Levofloxacin Nausea And Vomiting    ROS: all negative except above.   Physical Exam: There were no vitals filed for this visit. BP 140/74   Pulse 97   Temp 97.9 F (36.6 C)   SpO2 97%  General Appearance: Well nourished, in no apparent distress. Eyes: PERRLA, EOMs, conjunctiva no swelling or erythema Sinuses: No Frontal/maxillary tenderness ENT/Mouth: Ext aud canals clear, TMs without erythema, bulging. No erythema, swelling, or exudate on post pharynx.  Tonsils not swollen or erythematous. Hearing normal.  Neck: Supple, thyroid normal.  Respiratory: Respiratory effort normal, BS equal bilaterally without rales,  rhonchi, wheezing or stridor.  Cardio: RRR with no MRGs. Brisk peripheral pulses without edema.  Abdomen: Soft, + BS.  Non tender, no guarding, rebound, hernias, masses. Lymphatics: Non tender without lymphadenopathy.  Musculoskeletal: Full ROM, 5/5 strength, 4/5 to RLE, tenderness to palpation to R lateral hip, trochanter, Gait not assessed.  Skin: Warm, dry without rashes, lesions, ecchymosis.  Neuro: Cranial nerves intact. Normal muscle tone, no cerebellar symptoms. Sensation intact.  Psych: Awake and oriented X 3, normal affect, Insight and Judgment appropriate.     [name], NP 1:37 PM [facility]

## 2020-01-07 NOTE — Patient Instructions (Signed)
° °  Abreva - This can be found at any pharmacy  Use 2-3 times a day on sores.   We will place an order for home health for you today, PT/OT.

## 2020-01-08 LAB — CBC WITH DIFFERENTIAL/PLATELET
Absolute Monocytes: 828 cells/uL (ref 200–950)
Basophils Absolute: 74 cells/uL (ref 0–200)
Basophils Relative: 0.8 %
Eosinophils Absolute: 92 cells/uL (ref 15–500)
Eosinophils Relative: 1 %
HCT: 43.5 % (ref 35.0–45.0)
Hemoglobin: 14.6 g/dL (ref 11.7–15.5)
Lymphs Abs: 2199 cells/uL (ref 850–3900)
MCH: 30.9 pg (ref 27.0–33.0)
MCHC: 33.6 g/dL (ref 32.0–36.0)
MCV: 92 fL (ref 80.0–100.0)
MPV: 10.1 fL (ref 7.5–12.5)
Monocytes Relative: 9 %
Neutro Abs: 6008 cells/uL (ref 1500–7800)
Neutrophils Relative %: 65.3 %
Platelets: 672 10*3/uL — ABNORMAL HIGH (ref 140–400)
RBC: 4.73 10*6/uL (ref 3.80–5.10)
RDW: 12.9 % (ref 11.0–15.0)
Total Lymphocyte: 23.9 %
WBC: 9.2 10*3/uL (ref 3.8–10.8)

## 2020-01-08 LAB — COMPLETE METABOLIC PANEL WITH GFR
AG Ratio: 1.3 (calc) (ref 1.0–2.5)
ALT: 13 U/L (ref 6–29)
AST: 14 U/L (ref 10–35)
Albumin: 3.8 g/dL (ref 3.6–5.1)
Alkaline phosphatase (APISO): 177 U/L — ABNORMAL HIGH (ref 37–153)
BUN: 18 mg/dL (ref 7–25)
CO2: 22 mmol/L (ref 20–32)
Calcium: 9.4 mg/dL (ref 8.6–10.4)
Chloride: 100 mmol/L (ref 98–110)
Creat: 0.61 mg/dL (ref 0.60–0.88)
GFR, Est African American: 98 mL/min/{1.73_m2} (ref >=60)
GFR, Est Non African American: 84 mL/min/{1.73_m2} (ref >=60)
Globulin: 2.9 g/dL (calc) (ref 1.9–3.7)
Glucose, Bld: 114 mg/dL — ABNORMAL HIGH (ref 65–99)
Potassium: 5.1 mmol/L (ref 3.5–5.3)
Sodium: 135 mmol/L (ref 135–146)
Total Bilirubin: 0.4 mg/dL (ref 0.2–1.2)
Total Protein: 6.7 g/dL (ref 6.1–8.1)

## 2020-01-16 ENCOUNTER — Telehealth: Payer: Self-pay

## 2020-01-16 DIAGNOSIS — I700 Atherosclerosis of aorta: Secondary | ICD-10-CM | POA: Diagnosis not present

## 2020-01-16 DIAGNOSIS — Z7951 Long term (current) use of inhaled steroids: Secondary | ICD-10-CM | POA: Diagnosis not present

## 2020-01-16 DIAGNOSIS — S32474D Nondisplaced fracture of medial wall of right acetabulum, subsequent encounter for fracture with routine healing: Secondary | ICD-10-CM | POA: Diagnosis not present

## 2020-01-16 DIAGNOSIS — Z7982 Long term (current) use of aspirin: Secondary | ICD-10-CM | POA: Diagnosis not present

## 2020-01-16 DIAGNOSIS — F419 Anxiety disorder, unspecified: Secondary | ICD-10-CM | POA: Diagnosis not present

## 2020-01-16 DIAGNOSIS — I2510 Atherosclerotic heart disease of native coronary artery without angina pectoris: Secondary | ICD-10-CM | POA: Diagnosis not present

## 2020-01-16 DIAGNOSIS — I1310 Hypertensive heart and chronic kidney disease without heart failure, with stage 1 through stage 4 chronic kidney disease, or unspecified chronic kidney disease: Secondary | ICD-10-CM | POA: Diagnosis not present

## 2020-01-16 DIAGNOSIS — E785 Hyperlipidemia, unspecified: Secondary | ICD-10-CM | POA: Diagnosis not present

## 2020-01-16 DIAGNOSIS — N183 Chronic kidney disease, stage 3 unspecified: Secondary | ICD-10-CM | POA: Diagnosis not present

## 2020-01-16 DIAGNOSIS — S32414D Nondisplaced fracture of anterior wall of right acetabulum, subsequent encounter for fracture with routine healing: Secondary | ICD-10-CM | POA: Diagnosis not present

## 2020-01-16 DIAGNOSIS — J449 Chronic obstructive pulmonary disease, unspecified: Secondary | ICD-10-CM | POA: Diagnosis not present

## 2020-01-16 DIAGNOSIS — M519 Unspecified thoracic, thoracolumbar and lumbosacral intervertebral disc disorder: Secondary | ICD-10-CM | POA: Diagnosis not present

## 2020-01-16 DIAGNOSIS — H269 Unspecified cataract: Secondary | ICD-10-CM | POA: Diagnosis not present

## 2020-01-16 DIAGNOSIS — M47816 Spondylosis without myelopathy or radiculopathy, lumbar region: Secondary | ICD-10-CM | POA: Diagnosis not present

## 2020-01-16 DIAGNOSIS — S3282XD Multiple fractures of pelvis without disruption of pelvic ring, subsequent encounter for fracture with routine healing: Secondary | ICD-10-CM | POA: Diagnosis not present

## 2020-01-16 DIAGNOSIS — K219 Gastro-esophageal reflux disease without esophagitis: Secondary | ICD-10-CM | POA: Diagnosis not present

## 2020-01-16 DIAGNOSIS — M4800 Spinal stenosis, site unspecified: Secondary | ICD-10-CM | POA: Diagnosis not present

## 2020-01-16 DIAGNOSIS — M62838 Other muscle spasm: Secondary | ICD-10-CM | POA: Diagnosis not present

## 2020-01-16 DIAGNOSIS — M160 Bilateral primary osteoarthritis of hip: Secondary | ICD-10-CM | POA: Diagnosis not present

## 2020-01-16 DIAGNOSIS — Z79899 Other long term (current) drug therapy: Secondary | ICD-10-CM | POA: Diagnosis not present

## 2020-01-16 DIAGNOSIS — D692 Other nonthrombocytopenic purpura: Secondary | ICD-10-CM | POA: Diagnosis not present

## 2020-01-16 DIAGNOSIS — K5730 Diverticulosis of large intestine without perforation or abscess without bleeding: Secondary | ICD-10-CM | POA: Diagnosis not present

## 2020-01-16 DIAGNOSIS — K5900 Constipation, unspecified: Secondary | ICD-10-CM | POA: Diagnosis not present

## 2020-01-16 DIAGNOSIS — M810 Age-related osteoporosis without current pathological fracture: Secondary | ICD-10-CM | POA: Diagnosis not present

## 2020-01-16 DIAGNOSIS — M170 Bilateral primary osteoarthritis of knee: Secondary | ICD-10-CM | POA: Diagnosis not present

## 2020-01-16 NOTE — Telephone Encounter (Signed)
See message below °

## 2020-01-16 NOTE — Telephone Encounter (Signed)
Can you give this to Jeneen Rinks to advise please

## 2020-01-16 NOTE — Telephone Encounter (Signed)
Please advise 

## 2020-01-16 NOTE — Telephone Encounter (Signed)
Jim from [facility] called he needs weight bearing status for patient. Clair Gulling stated she had a fall again and he doesn't know if imaging was done and pain level is 7/10 and during the day she is taking tylenol ,volterangel call back:[phone].

## 2020-01-17 ENCOUNTER — Telehealth: Payer: Self-pay | Admitting: *Deleted

## 2020-01-17 ENCOUNTER — Telehealth: Payer: Self-pay

## 2020-01-17 NOTE — Telephone Encounter (Signed)
Please advise 

## 2020-01-17 NOTE — Telephone Encounter (Signed)
Patient originally seen by Dr. Lorin Mercy, MRI was done, seen on rounds on Sunday 8/22, MRI showed edema within the roof of the acetabulum consistent with insufficiency fracture of the dome of the acetabulum, non displaced. She was placed on touch down weight bearing status right hip and leg. She is nearly 3 weeks since being seen on rounds, has not been seen in follow up. Therapy wishes to know weight bearing status. Normally would place at touch down weight bearing for 6 weeks total then gradual increased weight bearing status.  Her next appointment is with Dr. Lorin Mercy in one week. If she is not painful with partial weight bearing then allow partial weight bearing right leg 25%.

## 2020-01-17 NOTE — Telephone Encounter (Signed)
I called and lmom for [name] [name] advising him of Dr Arvil Persons note.

## 2020-01-17 NOTE — Telephone Encounter (Signed)
Returned call to [place], [place]. He Sates patient's BP was 140/90 at his visit. Per Dr Melford Aase, continue Amlodipine and Metoprolol at same dose and keep OV here on 01/22/2020. Clair Gulling also inquired about weight bearing and frequency of PT. Per Dr Melford Aase, he will need to orders from her orthopedist. Clair Gulling was also concerned about her 2 recent falls and questioned if she may have syncope. This will be addressed at her upcoming OV.

## 2020-01-17 NOTE — Telephone Encounter (Signed)
Spoke with Clair Gulling to clarify, since it looks like it has been quite a while since patient was seen by Dr. Louanne Skye.   Clair Gulling stated that patient was in hospital 12/20/19 regarding a hip fracture and referred to Dr. Lorin Mercy.  However patient states that Dr. Louanne Skye stopped by and instructed weight bearing status, Clair Gulling cannot find these instructions in chart and patient does not have written copy.  Can Dr. Louanne Skye please confirm weight bearing status and address pain?

## 2020-01-17 NOTE — Telephone Encounter (Signed)
I called and relayed information to Hollis with [facility].

## 2020-01-17 NOTE — Telephone Encounter (Signed)
Jim from [facility] called he needs weight bearing status for patient. Clair Gulling stated she had a fall again and he doesn't know if imaging was done and pain level is 7/10 and during the day she is taking tylenol ,volterangel call [phone]. Also states that pain is poorly controlled C/o 7/10 pain

## 2020-01-18 NOTE — Telephone Encounter (Signed)
I did call Clair Gulling with [facility] and relay the information concerning her weight bearing status.

## 2020-01-20 ENCOUNTER — Telehealth: Payer: Self-pay

## 2020-01-20 ENCOUNTER — Encounter: Payer: Self-pay | Admitting: Internal Medicine

## 2020-01-20 NOTE — Progress Notes (Signed)
History of Present Illness:     This very nice 84 y.o. MWF with HTN, ASCAD,  HLD, Pre-Diabetes, GERD,  Vitamin D Deficiency,  chronic pain from DJD/DDD and vertebral compression Fx's was recently hospitalized 08/13-24/2021 with pelvic acetabular Fx and is receiving non weightbearing home LPT under Dr.s Louanne Skye & Lorin Mercy. She presents today with recent labile BP's   Medications  Current Outpatient Medications (Endocrine & Metabolic):  .  denosumab (PROLIA) 60 MG/ML SOSY injection, Inject 60 mg into the skin every 6 (six) months.  Current Facility-Administered Medications (Endocrine & Metabolic):  .  calcitonin (salmon) (MIACALCIN/FORTICAL) nasal spray 1 spray  Current Outpatient Medications (Cardiovascular):  .  amLODipine (NORVASC) 10 MG tablet, Take 1 tablet Daily for BP & Heart .  metoprolol tartrate (LOPRESSOR) 50 MG tablet, Take 1 tablet 2 x /day for BP & Heart (Patient taking differently: Take 25 mg by mouth at bedtime. ) .  nitroGLYCERIN (NITROSTAT) 0.4 MG SL tablet, Place 1 tablet (0.4 mg total) under the tongue every 5 (five) minutes as needed for chest pain.   Current Outpatient Medications (Respiratory):  [name]  Fluticasone-Salmeterol (ADVAIR DISKUS) 250-50 MCG/DOSE AEPB, Inhale 1 puff into the lungs in the morning and at bedtime. .  Glycopyrrolate-Formoterol (BEVESPI AEROSPHERE) 9-4.8 MCG/ACT AERO, Inhale 1 puff into the lungs daily. [name]  ipratropium-albuterol (DUONEB) 0.5-2.5 (3) MG/3ML SOLN, Take 3 mLs by nebulization every 6 (six) hours as needed (shortness of breath/wheezing).  .  promethazine (PHENERGAN) 25 MG tablet, Take 1 tablet every 4 hours if needed for Nausea or Vomitting   Current Outpatient Medications (Analgesics):  .  aspirin EC 81 MG tablet, Take 81 mg by mouth at bedtime.  .  traMADol (ULTRAM) 50 MG tablet, Take 2 tablets (100 mg total) by mouth every 6 (six) hours.   Current Outpatient Medications (Hematological):  .  vitamin B-12 (CYANOCOBALAMIN) 1000 MCG  tablet, Take 1,000 mcg by mouth daily.   Current Outpatient Medications (Other):  [name]  ALPRAZolam (XANAX) 0.5 MG tablet, Take 0.5-1 tablets (0.25-0.5 mg total) by mouth daily as needed for anxiety. Take 1/2 tablet 1- 2 x /day ONLY if needed for Anxiety Attack &  limit to 5 days /week to avoid Addiction - DO NOT take with Hydrocodone or Codeine .  bisacodyl (DULCOLAX) 10 MG suppository, Place 1 suppository (10 mg total) rectally as needed for moderate constipation. .  Cholecalciferol (VITAMIN D3) 5000 units CAPS, Take 5,000 Units by mouth 2 (two) times daily.  .  cyclobenzaprine (FLEXERIL) 5 MG tablet, Take 1 tablet (5 mg total) by mouth 3 (three) times daily as needed for muscle spasms. .  diclofenac sodium (VOLTAREN) 1 % GEL, Apply 2 g topically 4 (four) times daily. (Patient taking differently: Apply 2 g topically 4 (four) times daily as needed (pain). ) .  dicyclomine (BENTYL) 20 MG tablet, Take 1 tablet (20 mg total) by mouth 3 (three) times daily as needed for spasms. Take 1 tablet 3 x  /day before meals for Cramping, Nausea or Diarhea .  DULoxetine (CYMBALTA) 30 MG capsule, Start taking 1 cap daily, for mood and chronic pain; increase to 2 caps in 1 month if tolerating well. .  gabapentin (NEURONTIN) 300 MG capsule, Take 1 capsule (300 mg total) by mouth 4 (four) times daily. [name]  ketoconazole (NIZORAL) 2 % cream, Apply 1 application topically 2 x /day (Patient taking differently: Apply 1 application topically 2 (two) times daily. Apply 1 application topically 2 x /day) .  Magnesium 400  MG TABS, Take 400 mg by mouth 2 (two) times daily.  .  meclizine (ANTIVERT) 25 MG tablet, Take 1/2 to 1 tablet 2 to 3 x /day ONLY if needed  For Dizziness / Vertigo (Patient taking differently: Take 12.5-25 mg by mouth 3 (three) times daily as needed for dizziness. Take 1/2 to 1 tablet 2 to 3 x /day ONLY if needed  For Dizziness / Vertigo) .  menthol-cetylpyridinium (CEPACOL) 3 MG lozenge, Take 1 lozenge (3 mg total)  by mouth as needed for sore throat. .  methocarbamol (ROBAXIN) 500 MG tablet, Take 1 tablet (500 mg total) by mouth 4 (four) times daily. .  Multiple Vitamins-Minerals (MULTIVITAMIN WITH MINERALS) tablet, Take 1 tablet by mouth daily. [name]  omeprazole (PRILOSEC) 40 MG capsule, Take 1 capsule 2 x /day for Indigestion , Heartburn & Nausea (Patient taking differently: Take 40 mg by mouth 2 (two) times daily. Take 1 capsule 2 x /day for Indigestion , Heartburn & Nausea) .  polyethylene glycol powder (GLYCOLAX/MIRALAX) powder, Take 17 g by mouth 2 (two) times daily. (Patient taking differently: Take 17 g by mouth daily. ) .  prochlorperazine (COMPAZINE) 5 MG tablet, Take 1 tablet (5 mg total) by mouth every 6 (six) hours as needed for refractory nausea / vomiting. [name]  rOPINIRole (REQUIP) 2 MG tablet, Take 1 tablet (2 mg total) by mouth 3 (three) times daily. [name]  senna-docusate (SENOKOT-S) 8.6-50 MG tablet, Take 2 tablets by mouth 2 (two) times daily. .  sucralfate (CARAFATE) 1 g tablet, Take 1 tablet 4 x /day before Meals & Bedtime for Indigestion & Heartburn (Patient taking differently: Take 1 g by mouth 4 (four) times daily. Take 1 tablet 4 x /day before Meals & Bedtime for Indigestion & Heartburn) .  trospium (SANCTURA) 20 MG tablet, Take 20 mg by mouth 2 (two) times daily.   Problem list She has Essential hypertension; Mild intermittent asthma without complication; ESOPHAGEAL STRICTURE; Incarcerated recurrent paraesophageal hiatal hernia s/p lap redo repair [id number]; Vertebral compression fracture (HCC); MGUS (monoclonal gammopathy of unknown significance); Chronic constipation; Prediabetes; Vitamin D deficiency; Osteoporosis; Coronary artery disease involving native heart without angina pectoris; Depression, major, in partial remission ([place]); Chronic pain syndrome; Hyperlipidemia, mixed; Gastroesophageal reflux disease; DDD (degenerative disc disease), lumbar; Spinal stenosis, lumbar region, with neurogenic  claudication; Ileus ([place]); Anxiety state; Atherosclerosis of aorta ([place]); Chronic bronchitis (HCC); T12 compression fracture (HCC); CKD (chronic kidney disease) stage 2, GFR 60-89 ml/min; Therapeutic opioid-induced constipation ([facility]); Abnormal UGI series; Abnormal glucose; FHx: heart disease; Senile purpura ([place]); Atherosclerosis of autologous vein coronary artery bypass graft with angina pectoris ([place]); Pubic ramus fracture ([place]); Multiple closed pelvic fractures without disruption of pelvic circle ([place]); Hyponatremia; Constipation; Impaired glucose tolerance; Hiatal hernia; History of fundoplication; Leukocytosis; Esophageal candidiasis ([place]); Elevated LFTs; and Insufficiency fracture of pelvis on their problem list.   Observations/Objective:  BP 128/72 right arm & 120/74 left arm  P 92   T 97 F   R 16   Postural Sit   BP 149/77   P 97          &         Stand BP 141/95      P  109   HEENT - WNL. Neck - supple.  Chest - Clear equal BS. Cor - Nl HS. RRR w/o sig MGR. PP 1(+). No edema. MS- FROM w/o deformities.  In Wheelchair. Generalized decrease in muscle power, tone & bulk.  Neuro -  Nl w/o focal abnormalities.  Assessment and Plan:  1. Essential hypertension  - CBC with Differential/Platelet - COMPLETE METABOLIC PANEL WITH GFR  2. Postural dizziness with near syncope  - CBC with Differential/Platelet - COMPLETE METABOLIC PANEL WITH GFR  3. Medication management  - CBC with Differential/Platelet - COMPLETE METABOLIC PANEL WITH GFR   Follow Up Instructions:        I discussed the assessment and treatment plan with the patient. The patient was provided an opportunity to ask questions and all were answered. The patient agreed with the plan and demonstrated an understanding of the instructions.       The patient was advised to call back or seek an in-person evaluation if the symptoms worsen or if the condition fails to improve as anticipated.    Kirtland Bouchard, MD

## 2020-01-20 NOTE — Telephone Encounter (Signed)
Herbert Deaner called concerning patient having x-rays done or patient's appointment being moved up due to a fall after leaving the hospital.  Patient has an appointment scheduled for Wednesday, 01/22/2020.

## 2020-01-21 ENCOUNTER — Ambulatory Visit (INDEPENDENT_AMBULATORY_CARE_PROVIDER_SITE_OTHER): Payer: PPO | Admitting: Internal Medicine

## 2020-01-21 ENCOUNTER — Other Ambulatory Visit: Payer: Self-pay

## 2020-01-21 VITALS — BP 128/72 | HR 92 | Temp 97.0°F | Resp 16

## 2020-01-21 DIAGNOSIS — Z79899 Other long term (current) drug therapy: Secondary | ICD-10-CM | POA: Diagnosis not present

## 2020-01-21 DIAGNOSIS — R42 Dizziness and giddiness: Secondary | ICD-10-CM

## 2020-01-21 DIAGNOSIS — I10 Essential (primary) hypertension: Secondary | ICD-10-CM

## 2020-01-21 DIAGNOSIS — R55 Syncope and collapse: Secondary | ICD-10-CM

## 2020-01-21 LAB — COMPLETE METABOLIC PANEL WITH GFR
AG Ratio: 1.6 (calc) (ref 1.0–2.5)
ALT: 12 U/L (ref 6–29)
AST: 14 U/L (ref 10–35)
Albumin: 4.2 g/dL (ref 3.6–5.1)
Alkaline phosphatase (APISO): 226 U/L — ABNORMAL HIGH (ref 37–153)
BUN: 12 mg/dL (ref 7–25)
CO2: 26 mmol/L (ref 20–32)
Calcium: 9.5 mg/dL (ref 8.6–10.4)
Chloride: 106 mmol/L (ref 98–110)
Creat: 0.62 mg/dL (ref 0.60–0.88)
GFR, Est African American: 97 mL/min/{1.73_m2} (ref >=60)
GFR, Est Non African American: 84 mL/min/{1.73_m2} (ref >=60)
Globulin: 2.7 g/dL (calc) (ref 1.9–3.7)
Glucose, Bld: 101 mg/dL — ABNORMAL HIGH (ref 65–99)
Potassium: 4.7 mmol/L (ref 3.5–5.3)
Sodium: 141 mmol/L (ref 135–146)
Total Bilirubin: 0.6 mg/dL (ref 0.2–1.2)
Total Protein: 6.9 g/dL (ref 6.1–8.1)

## 2020-01-21 LAB — CBC WITH DIFFERENTIAL/PLATELET
Absolute Monocytes: 918 cells/uL (ref 200–950)
Basophils Absolute: 61 cells/uL (ref 0–200)
Basophils Relative: 0.6 %
Eosinophils Absolute: 92 cells/uL (ref 15–500)
Eosinophils Relative: 0.9 %
HCT: 46.1 % — ABNORMAL HIGH (ref 35.0–45.0)
Hemoglobin: 14.9 g/dL (ref 11.7–15.5)
Lymphs Abs: 2122 cells/uL (ref 850–3900)
MCH: 29.8 pg (ref 27.0–33.0)
MCHC: 32.3 g/dL (ref 32.0–36.0)
MCV: 92.2 fL (ref 80.0–100.0)
MPV: 10.5 fL (ref 7.5–12.5)
Monocytes Relative: 9 %
Neutro Abs: 7007 cells/uL (ref 1500–7800)
Neutrophils Relative %: 68.7 %
Platelets: 546 10*3/uL — ABNORMAL HIGH (ref 140–400)
RBC: 5 10*6/uL (ref 3.80–5.10)
RDW: 13.1 % (ref 11.0–15.0)
Total Lymphocyte: 20.8 %
WBC: 10.2 10*3/uL (ref 3.8–10.8)

## 2020-01-21 NOTE — Patient Instructions (Signed)
Orthostatic Hypotension Blood pressure is a measurement of how strongly, or weakly, your blood is pressing against the walls of your arteries. Orthostatic hypotension is a sudden drop in blood pressure that happens when you quickly change positions, such as when you get up from sitting or lying down. Arteries are blood vessels that carry blood from your heart throughout your body. When blood pressure is too low, you may not get enough blood to your brain or to the rest of your organs. This can cause weakness, light-headedness, rapid heartbeat, and fainting. This can last for just a few seconds or for up to a few minutes. Orthostatic hypotension is usually not a serious problem. However, if it happens frequently or gets worse, it may be a sign of something more serious. What are the causes? This condition may be caused by:  Sudden changes in posture, such as standing up quickly after you have been sitting or lying down.  Blood loss.  Loss of body fluids (dehydration).  Heart problems.  Hormone (endocrine) problems.  Pregnancy.  Severe infection.  Lack of certain nutrients.  Severe allergic reactions (anaphylaxis).  Certain medicines, such as blood pressure medicine or medicines that make the body lose excess fluids (diuretics). Sometimes, this condition can be caused by not taking medicine as directed, such as taking too much of a certain medicine. What increases the risk? The following factors may make you more likely to develop this condition:  Age. Risk increases as you get older.  Conditions that affect the heart or the central nervous system.  Taking certain medicines, such as blood pressure medicine or diuretics.  Being pregnant. What are the signs or symptoms? Symptoms of this condition may include:  Weakness.  Light-headedness.  Dizziness.  Blurred vision.  Fatigue.  Rapid heartbeat.  Fainting, in severe cases. How is this diagnosed? This condition is  diagnosed based on:  Your medical history.  Your symptoms.  Your blood pressure measurement. Your health care provider will check your blood pressure when you are: ? Lying down. ? Sitting. ? Standing. A blood pressure reading is recorded as two numbers, such as "120 over 80" (or 120/80). The first ("top") number is called the systolic pressure. It is a measure of the pressure in your arteries as your heart beats. The second ("bottom") number is called the diastolic pressure. It is a measure of the pressure in your arteries when your heart relaxes between beats. Blood pressure is measured in a unit called mm Hg. Healthy blood pressure for most adults is 120/80. If your blood pressure is below 90/60, you may be diagnosed with hypotension. Other information or tests that may be used to diagnose orthostatic hypotension include:  Your other vital signs, such as your heart rate and temperature.  Blood tests.  Tilt table test. For this test, you will be safely secured to a table that moves you from a lying position to an upright position. Your heart rhythm and blood pressure will be monitored during the test. How is this treated? This condition may be treated by:  Changing your diet. This may involve eating more salt (sodium) or drinking more water.  Taking medicines to raise your blood pressure.  Changing the dosage of certain medicines you are taking that might be lowering your blood pressure.  Wearing compression stockings. These stockings help to prevent blood clots and reduce swelling in your legs. In some cases, you may need to go to the hospital for:  Fluid replacement. This means you will   receive fluids through an IV.  Blood replacement. This means you will receive donated blood through an IV (transfusion).  Treating an infection or heart problems, if this applies.  Monitoring. You may need to be monitored while medicines that you are taking wear off. Follow these instructions  at home: Eating and drinking   Drink enough fluid to keep your urine pale yellow.  Eat a healthy diet, and follow instructions from your health care provider about eating or drinking restrictions. A healthy diet includes: ? Fresh fruits and vegetables. ? Whole grains. ? Lean meats. ? Low-fat dairy products.  Eat extra salt only as directed. Do not add extra salt to your diet unless your health care provider told you to do that.  Eat frequent, small meals.  Avoid standing up suddenly after eating. Medicines  Take over-the-counter and prescription medicines only as told by your health care provider. ? Follow instructions from your health care provider about changing the dosage of your current medicines, if this applies. ? Do not stop or adjust any of your medicines on your own. General instructions   Wear compression stockings as told by your health care provider.  Get up slowly from lying down or sitting positions. This gives your blood pressure a chance to adjust.  Avoid hot showers and excessive heat as directed by your health care provider.  Return to your normal activities as told by your health care provider. Ask your health care provider what activities are safe for you.  Do not use any products that contain nicotine or tobacco, such as cigarettes, e-cigarettes, and chewing tobacco. If you need help quitting, ask your health care provider.  Keep all follow-up visits as told by your health care provider. This is important. Contact a health care provider if you:  Vomit.  Have diarrhea.  Have a fever for more than 2-3 days.  Feel more thirsty than usual.  Feel weak and tired. Get help right away if you:  Have chest pain.  Have a fast or irregular heartbeat.  Develop numbness in any part of your body.  Cannot move your arms or your legs.  Have trouble speaking.  Become sweaty or feel light-headed.  Faint.  Feel short of breath.  Have trouble staying  awake.  Feel confused. Summary  Orthostatic hypotension is a sudden drop in blood pressure that happens when you quickly change positions.  Orthostatic hypotension is usually not a serious problem.  It is diagnosed by having your blood pressure taken lying down, sitting, and then standing.  It may be treated by changing your diet or adjusting your medicines. This information is not intended to replace advice given to you by your health care provider. Make sure you discuss any questions you have with your health care provider. Document Revised: 10/19/2017 Document Reviewed: 10/19/2017 Elsevier Patient Education  2020 [employer]  

## 2020-01-21 NOTE — Progress Notes (Signed)
============================================  -   CBC - Kidneys - Electrolytes &  Liver   - all  Normal / OK  <><><><><><><><><><><><><><><><><><><><><><><><><><><><><><><><><> <><><><><><><><><><><><><><><><><><><><><><><><><><><><><><><><><> 

## 2020-01-22 ENCOUNTER — Ambulatory Visit: Payer: PPO | Admitting: Physician Assistant

## 2020-01-22 ENCOUNTER — Ambulatory Visit (INDEPENDENT_AMBULATORY_CARE_PROVIDER_SITE_OTHER): Payer: PPO | Admitting: Orthopaedic Surgery

## 2020-01-22 ENCOUNTER — Telehealth: Payer: Self-pay

## 2020-01-22 ENCOUNTER — Encounter: Payer: Self-pay | Admitting: Orthopaedic Surgery

## 2020-01-22 ENCOUNTER — Ambulatory Visit (INDEPENDENT_AMBULATORY_CARE_PROVIDER_SITE_OTHER): Payer: PPO

## 2020-01-22 VITALS — Ht 64.0 in | Wt 155.0 lb

## 2020-01-22 DIAGNOSIS — S32591D Other specified fracture of right pubis, subsequent encounter for fracture with routine healing: Secondary | ICD-10-CM

## 2020-01-22 NOTE — Progress Notes (Signed)
LAB RESULTS WERE GIVEN TO PATIENT'S HUSBAND. -[facility]

## 2020-01-22 NOTE — Telephone Encounter (Signed)
Called and spoke to pt. She recently fell and broke her pelvis and it is hard to get around.  She will get to the lab one day next week for LFTs.  She had labs for [facility] but not Hepatic Function Panel.

## 2020-01-22 NOTE — Progress Notes (Signed)
Office Visit Note   Patient: [name] [name]           Date of Birth: Sep 29, 1935           MRN: [id number] Visit Date: 01/22/2020              Requested by: [name], [address] [place],  [place] [address] PCP: [name], MD   Assessment & Plan: Visit Diagnoses:  1. Closed fracture of ramus of right pubis with routine healing, subsequent encounter     Plan: Recheck 1 month single AP x-ray pelvis on return.  We discussed some hyperalgesia which can occur with long-term narcotic use and may be making the pain with the fracture worse than normal for her.  She does have old vertebral fractures but no evidence of new vertebral fractures.  Return 1 month x-ray as above.  Follow-Up Instructions: No follow-ups on file.   Orders:  Orders Placed This Encounter  Procedures  . XR Pelvis 1-2 Views   No orders of the defined types were placed in this encounter.     Procedures: No procedures performed   Clinical Data: No additional findings.   Subjective: Chief Complaint  Patient presents with  . Pelvis - Fracture, Follow-up    HPI 84 year old female returns for follow-up of right inferior pubic rami fracture and right ischium anterior medial acetabular fracture noted on CT 12/20/2019.  Patient states she still having considerable pain she is on tramadol and Tylenol.  She has been followed by Dr. Nicholaus Bloom.  PDMP review shows long-term narcotic prescription medication wean from 180 Percocet a month down to hydrocodone 7.5/3 2560 tablets a month and then down to tramadol 2 tablets p.o. every 6 hours as needed pain.  Patient still has pinpoint pupils noted today.  She states she still in considerable pain.  She is going to therapy she states is helping some.  Review of Systems reviewed and updated unchanged.   Objective: Vital Signs: Ht 5\' 4"  (1.626 m)   Wt 155 lb (70.3 kg)   BMI 26.61 kg/m   Physical Exam Constitutional:      Appearance:  She is well-developed.  HENT:     Head: Normocephalic.     Right Ear: External ear normal.     Left Ear: External ear normal.  Eyes:     Pupils: Pupils are equal, round, and reactive to light.     Comments: Bilateral pinpoint pupils that react to light.  No scleral icterus  Neck:     Thyroid: No thyromegaly.     Trachea: No tracheal deviation.  Cardiovascular:     Rate and Rhythm: Normal rate.  Pulmonary:     Effort: Pulmonary effort is normal.  Abdominal:     Palpations: Abdomen is soft.  Skin:    General: Skin is warm and dry.  Neurological:     Mental Status: She is alert and oriented to person, place, and time.  Psychiatric:        Behavior: Behavior normal.     Ortho Exam negative straight leg raising 90 degrees.  Knees reach full extension.  States lower extremities is intact.  Specialty Comments:  No specialty comments available.  Imaging: No results found.   PMFS History: Patient Active Problem List   Diagnosis Date Noted  . Insufficiency fracture of pelvis 12/29/2019    Class: Acute  . Multiple closed pelvic fractures without disruption of pelvic circle (HCC)   . Impaired glucose  tolerance   . History of fundoplication   . Pubic ramus fracture ([place]) 12/20/2019  . Senile purpura ([place]) 07/05/2019  . Atherosclerosis of autologous vein coronary artery bypass graft with angina pectoris ([place]) 07/05/2019  . Abnormal glucose 03/01/2018  . FHx: heart disease 03/01/2018  . CKD (chronic kidney disease) stage 2, GFR 60-89 ml/min 09/27/2017  . T12 compression fracture ([place]) 09/26/2017  . Chronic bronchitis ([place]) 08/09/2017  . Atherosclerosis of aorta ([place]) 04/05/2017  . Spinal stenosis, lumbar region, with neurogenic claudication 05/29/2015  . DDD (degenerative disc disease), lumbar 04/21/2015  . Hyperlipidemia, mixed 11/24/2014  . Gastroesophageal reflux disease 11/24/2014  . Depression, major, in partial remission ([place]) 09/26/2014  . Chronic pain syndrome  09/26/2014  . Coronary artery disease involving native heart without angina pectoris   . Osteoporosis 02/10/2014  . Prediabetes 08/14/2013  . Vitamin D deficiency 08/14/2013  . MGUS (monoclonal gammopathy of unknown significance) 03/05/2013  . Vertebral compression fracture ([place]) 06/23/2012  . ESOPHAGEAL STRICTURE 08/28/2008  . Mild intermittent asthma without complication 95/01/3266  . Incarcerated recurrent paraesophageal hiatal hernia s/p lap redo repair [id number] 01/31/2008  . Essential hypertension 07/25/2007   Past Medical History:  Diagnosis Date  . Anxiety    takes Xanax daily as needed  . Asthma    Albuterol daily as needed  . Blood transfusion without reported diagnosis   . CAD (coronary artery disease)    LHC 2003 with 40% pLAD, 30% mLAD, 100% D1 (moderate vessel), 100%  D2 (small vessel), 95% mid small OM2.  Pt had PTCA to D1;  D2 and OM2 small vessels and tx medically;  Last Myoview (2012): anterior infarct seen with scar, no ischemia. EF normal. Med Rx // Myoview (12/14):  Low risk; ant scar w/ peri-infarct ischemia; EF 55% with ant AK // Myoview 5/16: EF 55-65, ant, apical scar, no ischemia   . Cataract   . Chronic back pain    HNP, DDD, spinal stenosis, vertebral compression fractures.   . Chronic nausea    takes Zofran daily as needed.  normal gastric emptying study 03/2013  . CKD (chronic kidney disease), stage III 09/27/2017  . Constipation    felt to be functional. takes Senokot and Miralax daily.  treated with Linzess in past.   . DIVERTICULOSIS, COLON 08/28/2008   Qualifier: Diagnosis of  By: [name], [name], [name]    . Elevated LFTs 2015   probably med induced DILI, from Augmentin vs Pravastatin  . GERD (gastroesophageal reflux disease)    hx of esophageal stricture   . Hiatal hernia    Paraesophageal hernias. s/p fundoplications in 9833 and 04/2013  . History of bronchitis several of yrs ago  . History of colon polyps 03/2009, 03/2011   adenomatous colon  polyps, no high grade dysplasia.   [name] History of vertigo    no meds  . Hyperlipidemia    takes Pravastatin daily  . Hypertension    takes Amlodipine,Metoprolol,and Losartan daily  . Nausea 03/2016  . Osteoporosis   . PONV (postoperative nausea and vomiting)    yrs ago  . Slow urinary stream    takes Rapaflo daily  . TIA (transient ischemic attack) 1995   no residual effects noted    Family History  Problem Relation Age of Onset  . Colon cancer Mother 5  . Heart attack Father 6       heart attack  . Brain cancer Brother        tumor   . Heart disease  Brother   . Heart disease Brother   . Kidney disease Daughter   . Esophageal cancer Neg Hx   . Stomach cancer Neg Hx     Past Surgical History:  Procedure Laterality Date  . APPENDECTOMY     patient unsure of date  . BACK SURGERY    . BLADDER REPAIR    . CHOLECYSTECTOMY     Patient unsure of date.  . CORONARY ANGIOPLASTY  11/16/2001   1 stent  . ESOPHAGEAL MANOMETRY N/A 03/25/2013   Procedure: ESOPHAGEAL MANOMETRY (EM);  Surgeon: Inda Castle, MD;  [facility];  Service: Endoscopy;  Laterality: N/A;  . ESOPHAGOGASTRODUODENOSCOPY N/A 03/15/2013   Procedure: ESOPHAGOGASTRODUODENOSCOPY (EGD);  Surgeon: Jerene Bears, MD;  Location: [facility] ENDOSCOPY;  Service: Endoscopy;  Laterality: N/A;  . ESOPHAGOGASTRODUODENOSCOPY N/A 12/25/2015   Procedure: ESOPHAGOGASTRODUODENOSCOPY (EGD);  Surgeon: Doran Stabler, MD;  Location: [facility] ENDOSCOPY;  Service: Endoscopy;  Laterality: N/A;  . EYE SURGERY  11/2000   bilateral cataracts with lens implant  . FIXATION KYPHOPLASTY LUMBAR SPINE  X 2   "L3-4"  . HEMORRHOID SURGERY    . HIATAL HERNIA REPAIR  06/03/2011   Procedure: LAPAROSCOPIC REPAIR OF HIATAL HERNIA;  Surgeon: Adin Hector, MD;  Location: [facility];  Service: General;  Laterality: N/A;  . HIATAL HERNIA REPAIR N/A 04/24/2013   Procedure: LAPAROSCOPIC  REPAIR RECURRENT PARASOPHAGEAL HIATAL HERNIA WITH FUNDOPLICATION;   Surgeon: Adin Hector, MD;  Location: [facility];  Service: General;  Laterality: N/A;  . INSERTION OF MESH N/A 04/24/2013   Procedure: INSERTION OF MESH;  Surgeon: Adin Hector, MD;  Location: [facility];  Service: General;  Laterality: N/A;  . IR KYPHO THORACIC WITH BONE BIOPSY  09/29/2017  . IR KYPHO THORACIC WITH BONE BIOPSY  11/03/2017  . IR KYPHO THORACIC WITH BONE BIOPSY  02/14/2019  . IR KYPHO THORACIC WITH BONE BIOPSY  04/02/2019  . LAPAROSCOPIC LYSIS OF ADHESIONS N/A 04/24/2013   Procedure: LAPAROSCOPIC LYSIS OF ADHESIONS;  Surgeon: Adin Hector, MD;  [facility];  Service: General;  Laterality: N/A;  . LAPAROSCOPIC NISSEN FUNDOPLICATION  8/46/6599   Procedure: LAPAROSCOPIC NISSEN FUNDOPLICATION;  Surgeon: Adin Hector, MD;  [facility];  Service: General;  Laterality: N/A;  . LUMBAR FUSION  12/07/2015   Right L2-3 facetectomy, posterolateral fusion L2-3 with pedicle screws, rods, local bone graft, Vivigen cancellous chips. Fusion extended to the L1 level with pedicle screws and rods  . LUMBAR LAMINECTOMY/DECOMPRESSION MICRODISCECTOMY Right 06/26/2012   Procedure: LUMBAR LAMINECTOMY/DECOMPRESSION MICRODISCECTOMY;  Surgeon: Jessy Oto, MD;  Location: [facility];  Service: Orthopedics;  Laterality: Right;  RIGHT L4-5 MICRODISCECTOMY  . LUMBAR LAMINECTOMY/DECOMPRESSION MICRODISCECTOMY N/A 05/29/2015   Procedure: RIGHT L2-3 MICRODISCECTOMY;  Surgeon: Jessy Oto, MD;  Location: [place];  Service: Orthopedics;  Laterality: N/A;  . TOTAL ABDOMINAL HYSTERECTOMY  1975   partial  . UPPER GASTROINTESTINAL ENDOSCOPY     Social History   Occupational History  . Occupation: Retired    Fish farm manager: RETIRED    Comment: worked at [employer]  . Smoking status: Never Smoker  . Smokeless tobacco: Never Used  Vaping Use  . Vaping Use: Never used  Substance and Sexual Activity  . Alcohol use: No  . Drug use: Never  . Sexual activity: Not on file

## 2020-01-22 NOTE — Telephone Encounter (Signed)
-----   Message from Roetta Sessions, [place] sent at 01/04/2020  8:50 AM EDT ----- Regarding: LFTs due LFTs due in late Sept

## 2020-01-23 ENCOUNTER — Telehealth: Payer: Self-pay

## 2020-01-23 NOTE — Telephone Encounter (Signed)
[name] [name] with [facility] would like to know patient's WB status and stated that patient's pain level is between 5-8.  Cb# [phone].  Please advise.  Thank you.

## 2020-01-23 NOTE — Telephone Encounter (Signed)
I called discussed. He said pt stopped tramadol and just used tylenol now

## 2020-01-23 NOTE — Telephone Encounter (Signed)
Please advise 

## 2020-01-27 DIAGNOSIS — K219 Gastro-esophageal reflux disease without esophagitis: Secondary | ICD-10-CM | POA: Diagnosis not present

## 2020-01-27 DIAGNOSIS — M519 Unspecified thoracic, thoracolumbar and lumbosacral intervertebral disc disorder: Secondary | ICD-10-CM | POA: Diagnosis not present

## 2020-01-27 DIAGNOSIS — H269 Unspecified cataract: Secondary | ICD-10-CM | POA: Diagnosis not present

## 2020-01-27 DIAGNOSIS — Z7982 Long term (current) use of aspirin: Secondary | ICD-10-CM | POA: Diagnosis not present

## 2020-01-27 DIAGNOSIS — M4800 Spinal stenosis, site unspecified: Secondary | ICD-10-CM | POA: Diagnosis not present

## 2020-01-27 DIAGNOSIS — M160 Bilateral primary osteoarthritis of hip: Secondary | ICD-10-CM | POA: Diagnosis not present

## 2020-01-27 DIAGNOSIS — I2510 Atherosclerotic heart disease of native coronary artery without angina pectoris: Secondary | ICD-10-CM | POA: Diagnosis not present

## 2020-01-27 DIAGNOSIS — E785 Hyperlipidemia, unspecified: Secondary | ICD-10-CM | POA: Diagnosis not present

## 2020-01-27 DIAGNOSIS — I700 Atherosclerosis of aorta: Secondary | ICD-10-CM | POA: Diagnosis not present

## 2020-01-27 DIAGNOSIS — K5730 Diverticulosis of large intestine without perforation or abscess without bleeding: Secondary | ICD-10-CM | POA: Diagnosis not present

## 2020-01-27 DIAGNOSIS — M47816 Spondylosis without myelopathy or radiculopathy, lumbar region: Secondary | ICD-10-CM | POA: Diagnosis not present

## 2020-01-27 DIAGNOSIS — J449 Chronic obstructive pulmonary disease, unspecified: Secondary | ICD-10-CM | POA: Diagnosis not present

## 2020-01-27 DIAGNOSIS — Z79899 Other long term (current) drug therapy: Secondary | ICD-10-CM | POA: Diagnosis not present

## 2020-01-27 DIAGNOSIS — S32414D Nondisplaced fracture of anterior wall of right acetabulum, subsequent encounter for fracture with routine healing: Secondary | ICD-10-CM | POA: Diagnosis not present

## 2020-01-27 DIAGNOSIS — M810 Age-related osteoporosis without current pathological fracture: Secondary | ICD-10-CM | POA: Diagnosis not present

## 2020-01-27 DIAGNOSIS — M62838 Other muscle spasm: Secondary | ICD-10-CM | POA: Diagnosis not present

## 2020-01-27 DIAGNOSIS — M170 Bilateral primary osteoarthritis of knee: Secondary | ICD-10-CM | POA: Diagnosis not present

## 2020-01-27 DIAGNOSIS — S3282XD Multiple fractures of pelvis without disruption of pelvic ring, subsequent encounter for fracture with routine healing: Secondary | ICD-10-CM | POA: Diagnosis not present

## 2020-01-27 DIAGNOSIS — I1310 Hypertensive heart and chronic kidney disease without heart failure, with stage 1 through stage 4 chronic kidney disease, or unspecified chronic kidney disease: Secondary | ICD-10-CM | POA: Diagnosis not present

## 2020-01-27 DIAGNOSIS — S32474D Nondisplaced fracture of medial wall of right acetabulum, subsequent encounter for fracture with routine healing: Secondary | ICD-10-CM | POA: Diagnosis not present

## 2020-01-27 DIAGNOSIS — K5900 Constipation, unspecified: Secondary | ICD-10-CM | POA: Diagnosis not present

## 2020-01-27 DIAGNOSIS — F419 Anxiety disorder, unspecified: Secondary | ICD-10-CM | POA: Diagnosis not present

## 2020-01-27 DIAGNOSIS — N183 Chronic kidney disease, stage 3 unspecified: Secondary | ICD-10-CM | POA: Diagnosis not present

## 2020-01-27 DIAGNOSIS — Z7951 Long term (current) use of inhaled steroids: Secondary | ICD-10-CM | POA: Diagnosis not present

## 2020-01-27 DIAGNOSIS — D692 Other nonthrombocytopenic purpura: Secondary | ICD-10-CM | POA: Diagnosis not present

## 2020-01-28 ENCOUNTER — Ambulatory Visit: Payer: PPO | Admitting: Orthopaedic Surgery

## 2020-01-29 ENCOUNTER — Other Ambulatory Visit (INDEPENDENT_AMBULATORY_CARE_PROVIDER_SITE_OTHER): Payer: PPO

## 2020-01-29 DIAGNOSIS — R7989 Other specified abnormal findings of blood chemistry: Secondary | ICD-10-CM | POA: Diagnosis not present

## 2020-01-29 LAB — HEPATIC FUNCTION PANEL
ALT: 11 U/L (ref 0–35)
AST: 17 U/L (ref 0–37)
Albumin: 3.9 g/dL (ref 3.5–5.2)
Alkaline Phosphatase: 185 U/L — ABNORMAL HIGH (ref 39–117)
Bilirubin, Direct: 0.1 mg/dL (ref 0.0–0.3)
Total Bilirubin: 0.4 mg/dL (ref 0.2–1.2)
Total Protein: 7 g/dL (ref 6.0–8.3)

## 2020-01-30 ENCOUNTER — Other Ambulatory Visit: Payer: Self-pay

## 2020-01-30 DIAGNOSIS — R7989 Other specified abnormal findings of blood chemistry: Secondary | ICD-10-CM

## 2020-01-31 ENCOUNTER — Other Ambulatory Visit (INDEPENDENT_AMBULATORY_CARE_PROVIDER_SITE_OTHER): Payer: Self-pay | Admitting: Specialist

## 2020-01-31 DIAGNOSIS — R0781 Pleurodynia: Secondary | ICD-10-CM

## 2020-01-31 DIAGNOSIS — M546 Pain in thoracic spine: Secondary | ICD-10-CM

## 2020-02-04 NOTE — Progress Notes (Deleted)
Office Visit Note  Patient: [name] [name]             Date of Birth: 30-Jan-1936           MRN: [id number]             PCP: [name], MD Referring: [name], MD Visit Date: 02/18/2020 Occupation: @GUAROCC @  Subjective:  No chief complaint on file.   History of Present Illness: JAELYNN [name] is a 84 y.o. female ***   Activities of Daily Living:  Patient reports morning stiffness for *** {minute/hour:19697}.   Patient {ACTIONS;DENIES/REPORTS:21021675::"Denies"} nocturnal pain.  Difficulty dressing/grooming: {ACTIONS;DENIES/REPORTS:21021675::"Denies"} Difficulty climbing stairs: {ACTIONS;DENIES/REPORTS:21021675::"Denies"} Difficulty getting out of chair: {ACTIONS;DENIES/REPORTS:21021675::"Denies"} Difficulty using hands for taps, buttons, cutlery, and/or writing: {ACTIONS;DENIES/REPORTS:21021675::"Denies"}  No Rheumatology ROS completed.   PMFS History:  Patient Active Problem List   Diagnosis Date Noted  . Insufficiency fracture of pelvis 12/29/2019    Class: Acute  . Multiple closed pelvic fractures without disruption of pelvic circle (HCC)   . Impaired glucose tolerance   . History of fundoplication   . Pubic ramus fracture ([place]) 12/20/2019  . Senile purpura ([place]) 07/05/2019  . Atherosclerosis of autologous vein coronary artery bypass graft with angina pectoris ([place]) 07/05/2019  . Abnormal glucose 03/01/2018  . FHx: heart disease 03/01/2018  . CKD (chronic kidney disease) stage 2, GFR 60-89 ml/min 09/27/2017  . T12 compression fracture ([place]) 09/26/2017  . Chronic bronchitis ([place]) 08/09/2017  . Atherosclerosis of aorta ([place]) 04/05/2017  . Spinal stenosis, lumbar region, with neurogenic claudication 05/29/2015  . DDD (degenerative disc disease), lumbar 04/21/2015  . Hyperlipidemia, mixed 11/24/2014  . Gastroesophageal reflux disease 11/24/2014  . Depression, major, in partial remission ([place]) 09/26/2014  . Chronic pain syndrome 09/26/2014  . Coronary  artery disease involving native heart without angina pectoris   . Osteoporosis 02/10/2014  . Prediabetes 08/14/2013  . Vitamin D deficiency 08/14/2013  . MGUS (monoclonal gammopathy of unknown significance) 03/05/2013  . Vertebral compression fracture ([place]) 06/23/2012  . ESOPHAGEAL STRICTURE 08/28/2008  . Mild intermittent asthma without complication 90/30/0923  . Incarcerated recurrent paraesophageal hiatal hernia s/p lap redo repair [id number] 01/31/2008  . Essential hypertension 07/25/2007    Past Medical History:  Diagnosis Date  . Anxiety    takes Xanax daily as needed  . Asthma    Albuterol daily as needed  . Blood transfusion without reported diagnosis   . CAD (coronary artery disease)    LHC 2003 with 40% pLAD, 30% mLAD, 100% D1 (moderate vessel), 100%  D2 (small vessel), 95% mid small OM2.  Pt had PTCA to D1;  D2 and OM2 small vessels and tx medically;  Last Myoview (2012): anterior infarct seen with scar, no ischemia. EF normal. Med Rx // Myoview (12/14):  Low risk; ant scar w/ peri-infarct ischemia; EF 55% with ant AK // Myoview 5/16: EF 55-65, ant, apical scar, no ischemia   . Cataract   . Chronic back pain    HNP, DDD, spinal stenosis, vertebral compression fractures.   . Chronic nausea    takes Zofran daily as needed.  normal gastric emptying study 03/2013  . CKD (chronic kidney disease), stage III 09/27/2017  . Constipation    felt to be functional. takes Senokot and Miralax daily.  treated with Linzess in past.   . DIVERTICULOSIS, COLON 08/28/2008   Qualifier: Diagnosis of  By: [name], [name], [name]    . Elevated LFTs 2015   probably med induced DILI, from Augmentin vs Pravastatin  .  GERD (gastroesophageal reflux disease)    hx of esophageal stricture   . Hiatal hernia    Paraesophageal hernias. s/p fundoplications in 8182 and 04/2013  . History of bronchitis several of yrs ago  . History of colon polyps 03/2009, 03/2011   adenomatous colon polyps, no high grade  dysplasia.   [name] History of vertigo    no meds  . Hyperlipidemia    takes Pravastatin daily  . Hypertension    takes Amlodipine,Metoprolol,and Losartan daily  . Nausea 03/2016  . Osteoporosis   . PONV (postoperative nausea and vomiting)    yrs ago  . Slow urinary stream    takes Rapaflo daily  . TIA (transient ischemic attack) 1995   no residual effects noted    Family History  Problem Relation Age of Onset  . Colon cancer Mother 39  . Heart attack Father 41       heart attack  . Brain cancer Brother        tumor   . Heart disease Brother   . Heart disease Brother   . Kidney disease Daughter   . Esophageal cancer Neg Hx   . Stomach cancer Neg Hx    Past Surgical History:  Procedure Laterality Date  . APPENDECTOMY     patient unsure of date  . BACK SURGERY    . BLADDER REPAIR    . CHOLECYSTECTOMY     Patient unsure of date.  . CORONARY ANGIOPLASTY  11/16/2001   1 stent  . ESOPHAGEAL MANOMETRY N/A 03/25/2013   Procedure: ESOPHAGEAL MANOMETRY (EM);  Surgeon: Inda Castle, MD;  [facility];  Service: Endoscopy;  Laterality: N/A;  . ESOPHAGOGASTRODUODENOSCOPY N/A 03/15/2013   Procedure: ESOPHAGOGASTRODUODENOSCOPY (EGD);  Surgeon: Jerene Bears, MD;  Location: [facility] ENDOSCOPY;  Service: Endoscopy;  Laterality: N/A;  . ESOPHAGOGASTRODUODENOSCOPY N/A 12/25/2015   Procedure: ESOPHAGOGASTRODUODENOSCOPY (EGD);  Surgeon: Doran Stabler, MD;  Location[facility] ENDOSCOPY;  Service: Endoscopy;  Laterality: N/A;  . EYE SURGERY  11/2000   bilateral cataracts with lens implant  . FIXATION KYPHOPLASTY LUMBAR SPINE  X 2   "L3-4"  . HEMORRHOID SURGERY    . HIATAL HERNIA REPAIR  06/03/2011   Procedure: LAPAROSCOPIC REPAIR OF HIATAL HERNIA;  Surgeon: Adin Hector, MD;  [facility];  Service: General;  Laterality: N/A;  . HIATAL HERNIA REPAIR N/A 04/24/2013   Procedure: LAPAROSCOPIC  REPAIR RECURRENT PARASOPHAGEAL HIATAL HERNIA WITH FUNDOPLICATION;  Surgeon: Adin Hector,  MD;  [facility];  Service: General;  Laterality: N/A;  . INSERTION OF MESH N/A 04/24/2013   Procedure: INSERTION OF MESH;  Surgeon: Adin Hector, MD;  [facility];  Service: General;  Laterality: N/A;  . IR KYPHO THORACIC WITH BONE BIOPSY  09/29/2017  . IR KYPHO THORACIC WITH BONE BIOPSY  11/03/2017  . IR KYPHO THORACIC WITH BONE BIOPSY  02/14/2019  . IR KYPHO THORACIC WITH BONE BIOPSY  04/02/2019  . LAPAROSCOPIC LYSIS OF ADHESIONS N/A 04/24/2013   Procedure: LAPAROSCOPIC LYSIS OF ADHESIONS;  Surgeon: Adin Hector, MD;  [facility];  Service: General;  Laterality: N/A;  . LAPAROSCOPIC NISSEN FUNDOPLICATION  9/93/7169   Procedure: LAPAROSCOPIC NISSEN FUNDOPLICATION;  Surgeon: Adin Hector, MD;  [facility];  Service: General;  Laterality: N/A;  . LUMBAR FUSION  12/07/2015   Right L2-3 facetectomy, posterolateral fusion L2-3 with pedicle screws, rods, local bone graft, Vivigen cancellous chips. Fusion extended to the L1 level with pedicle screws and  rods  . LUMBAR LAMINECTOMY/DECOMPRESSION MICRODISCECTOMY Right 06/26/2012   Procedure: LUMBAR LAMINECTOMY/DECOMPRESSION MICRODISCECTOMY;  Surgeon: Jessy Oto, MD;  Location: [place];  Service: Orthopedics;  Laterality: Right;  RIGHT L4-5 MICRODISCECTOMY  . LUMBAR LAMINECTOMY/DECOMPRESSION MICRODISCECTOMY N/A 05/29/2015   Procedure: RIGHT L2-3 MICRODISCECTOMY;  Surgeon: Jessy Oto, MD;  Location: [place];  Service: Orthopedics;  Laterality: N/A;  . TOTAL ABDOMINAL HYSTERECTOMY  1975   partial  . UPPER GASTROINTESTINAL ENDOSCOPY     Social History   Social History Narrative   Lives with husband.   Right-handed.   3 cups caffeine per day.   Immunization History  Administered Date(s) Administered  . DT (Pediatric) 05/06/2014  . Influenza Split 02/07/2011  . Influenza Whole 02/09/2009, 01/19/2010, 02/13/2012  . Influenza, High Dose Seasonal PF 03/22/2013, 01/15/2014, 02/27/2015, 01/14/2016, 01/18/2017, 02/14/2018,  02/14/2019  . PFIZER SARS-COV-2 Vaccination 05/25/2019, 06/15/2019  . Pneumococcal Conjugate-13 05/06/2014  . Pneumococcal Polysaccharide-23 02/09/2011  . Pneumococcal-Unspecified 02/06/2010, 02/09/2012  . Td 05/10/2003     Objective: Vital Signs: There were no vitals taken for this visit.   Physical Exam   Musculoskeletal Exam: ***  CDAI Exam: CDAI Score: -- Patient Global: --; Provider Global: -- Swollen: --; Tender: -- Joint Exam 02/18/2020   No joint exam has been documented for this visit   There is currently no information documented on the homunculus. Go to the Rheumatology activity and complete the homunculus joint exam.  Investigation: No additional findings.  Imaging: XR Pelvis 1-2 Views  Result Date: 01/22/2020 AP pelvis x-ray shows some sclerotic interval healing inferior superior rami with extension of the superior rami into the medial acetabulum without hip joint displacement. Impression: Right rami fractures with interval healing comparison to CT pelvis 12/20/2019.   Recent Labs: Lab Results  Component Value Date   WBC 10.2 01/21/2020   HGB 14.9 01/21/2020   PLT 546 (H) 01/21/2020   NA 141 01/21/2020   K 4.7 01/21/2020   CL 106 01/21/2020   CO2 26 01/21/2020   GLUCOSE 101 (H) 01/21/2020   BUN 12 01/21/2020   CREATININE 0.62 01/21/2020   BILITOT 0.4 01/29/2020   ALKPHOS 185 (H) 01/29/2020   AST 17 01/29/2020   ALT 11 01/29/2020   PROT 7.0 01/29/2020   ALBUMIN 3.9 01/29/2020   CALCIUM 9.5 01/21/2020   GFRAA 97 01/21/2020    Speciality Comments: No specialty comments available.  Procedures:  No procedures performed Allergies: Ace inhibitors, Aspartame, Atorvastatin, Biaxin [clarithromycin], Daliresp [roflumilast], Erythromycin, and Levofloxacin   Assessment / Plan:     Visit Diagnoses: No diagnosis found.  Orders: No orders of the defined types were placed in this encounter.  No orders of the defined types were placed in this  encounter.   Face-to-face time spent with patient was *** minutes. Greater than 50% of time was spent in counseling and coordination of care.  Follow-Up Instructions: No follow-ups on file.   Earnestine Mealing, CMA  Note - This record has been created using Editor, commissioning.  Chart creation errors have been sought, but may not always  have been located. Such creation errors do not reflect on  the standard of medical care.

## 2020-02-05 ENCOUNTER — Ambulatory Visit: Payer: PPO | Admitting: Orthopaedic Surgery

## 2020-02-10 ENCOUNTER — Telehealth: Payer: Self-pay

## 2020-02-10 DIAGNOSIS — S3282XD Multiple fractures of pelvis without disruption of pelvic ring, subsequent encounter for fracture with routine healing: Secondary | ICD-10-CM | POA: Diagnosis not present

## 2020-02-10 DIAGNOSIS — Z7951 Long term (current) use of inhaled steroids: Secondary | ICD-10-CM | POA: Diagnosis not present

## 2020-02-10 DIAGNOSIS — S32474D Nondisplaced fracture of medial wall of right acetabulum, subsequent encounter for fracture with routine healing: Secondary | ICD-10-CM | POA: Diagnosis not present

## 2020-02-10 DIAGNOSIS — M47816 Spondylosis without myelopathy or radiculopathy, lumbar region: Secondary | ICD-10-CM | POA: Diagnosis not present

## 2020-02-10 DIAGNOSIS — I1310 Hypertensive heart and chronic kidney disease without heart failure, with stage 1 through stage 4 chronic kidney disease, or unspecified chronic kidney disease: Secondary | ICD-10-CM | POA: Diagnosis not present

## 2020-02-10 DIAGNOSIS — K5730 Diverticulosis of large intestine without perforation or abscess without bleeding: Secondary | ICD-10-CM | POA: Diagnosis not present

## 2020-02-10 DIAGNOSIS — M4800 Spinal stenosis, site unspecified: Secondary | ICD-10-CM | POA: Diagnosis not present

## 2020-02-10 DIAGNOSIS — K219 Gastro-esophageal reflux disease without esophagitis: Secondary | ICD-10-CM | POA: Diagnosis not present

## 2020-02-10 DIAGNOSIS — Z7982 Long term (current) use of aspirin: Secondary | ICD-10-CM | POA: Diagnosis not present

## 2020-02-10 DIAGNOSIS — M170 Bilateral primary osteoarthritis of knee: Secondary | ICD-10-CM | POA: Diagnosis not present

## 2020-02-10 DIAGNOSIS — D692 Other nonthrombocytopenic purpura: Secondary | ICD-10-CM | POA: Diagnosis not present

## 2020-02-10 DIAGNOSIS — I700 Atherosclerosis of aorta: Secondary | ICD-10-CM | POA: Diagnosis not present

## 2020-02-10 DIAGNOSIS — J449 Chronic obstructive pulmonary disease, unspecified: Secondary | ICD-10-CM | POA: Diagnosis not present

## 2020-02-10 DIAGNOSIS — H269 Unspecified cataract: Secondary | ICD-10-CM | POA: Diagnosis not present

## 2020-02-10 DIAGNOSIS — M62838 Other muscle spasm: Secondary | ICD-10-CM | POA: Diagnosis not present

## 2020-02-10 DIAGNOSIS — F419 Anxiety disorder, unspecified: Secondary | ICD-10-CM | POA: Diagnosis not present

## 2020-02-10 DIAGNOSIS — S32414D Nondisplaced fracture of anterior wall of right acetabulum, subsequent encounter for fracture with routine healing: Secondary | ICD-10-CM | POA: Diagnosis not present

## 2020-02-10 DIAGNOSIS — Z79899 Other long term (current) drug therapy: Secondary | ICD-10-CM | POA: Diagnosis not present

## 2020-02-10 DIAGNOSIS — N183 Chronic kidney disease, stage 3 unspecified: Secondary | ICD-10-CM | POA: Diagnosis not present

## 2020-02-10 DIAGNOSIS — I2510 Atherosclerotic heart disease of native coronary artery without angina pectoris: Secondary | ICD-10-CM | POA: Diagnosis not present

## 2020-02-10 DIAGNOSIS — M810 Age-related osteoporosis without current pathological fracture: Secondary | ICD-10-CM | POA: Diagnosis not present

## 2020-02-10 DIAGNOSIS — K5900 Constipation, unspecified: Secondary | ICD-10-CM | POA: Diagnosis not present

## 2020-02-10 DIAGNOSIS — M160 Bilateral primary osteoarthritis of hip: Secondary | ICD-10-CM | POA: Diagnosis not present

## 2020-02-10 DIAGNOSIS — M519 Unspecified thoracic, thoracolumbar and lumbosacral intervertebral disc disorder: Secondary | ICD-10-CM | POA: Diagnosis not present

## 2020-02-10 DIAGNOSIS — E785 Hyperlipidemia, unspecified: Secondary | ICD-10-CM | POA: Diagnosis not present

## 2020-02-10 NOTE — Telephone Encounter (Signed)
Patient states that she is doing much better than this morning. Inhaler is working.

## 2020-02-10 NOTE — Telephone Encounter (Signed)
Ida from [facility] called to let us know that patient started wheezing on the right side of her lung and coughing up yellow mucus.  Please advise.

## 2020-02-14 ENCOUNTER — Other Ambulatory Visit: Payer: Self-pay | Admitting: Internal Medicine

## 2020-02-14 ENCOUNTER — Other Ambulatory Visit: Payer: Self-pay | Admitting: Adult Health

## 2020-02-14 DIAGNOSIS — K219 Gastro-esophageal reflux disease without esophagitis: Secondary | ICD-10-CM

## 2020-02-14 DIAGNOSIS — Z79899 Other long term (current) drug therapy: Secondary | ICD-10-CM

## 2020-02-17 ENCOUNTER — Telehealth: Payer: Self-pay

## 2020-02-17 DIAGNOSIS — M4726 Other spondylosis with radiculopathy, lumbar region: Secondary | ICD-10-CM | POA: Diagnosis not present

## 2020-02-17 DIAGNOSIS — G894 Chronic pain syndrome: Secondary | ICD-10-CM | POA: Diagnosis not present

## 2020-02-17 DIAGNOSIS — M961 Postlaminectomy syndrome, not elsewhere classified: Secondary | ICD-10-CM | POA: Diagnosis not present

## 2020-02-17 DIAGNOSIS — M150 Primary generalized (osteo)arthritis: Secondary | ICD-10-CM | POA: Diagnosis not present

## 2020-02-17 NOTE — Telephone Encounter (Signed)
Patient advised she will not be receiving the Prolia at her appointment on 02/18/2020 as it will be to early for the injection. Patient advised she will not be due on or after 03/14/2020.

## 2020-02-17 NOTE — Telephone Encounter (Signed)
Patient called stating she is scheduled for her ROV on Wednesday, 02/19/20 and wants to make sure she will be receiving her Prolia injection at that appointment.  Please advise.

## 2020-02-18 ENCOUNTER — Ambulatory Visit: Payer: PPO | Admitting: Rheumatology

## 2020-02-19 ENCOUNTER — Encounter: Payer: Self-pay | Admitting: Internal Medicine

## 2020-02-19 ENCOUNTER — Other Ambulatory Visit: Payer: Self-pay

## 2020-02-19 ENCOUNTER — Ambulatory Visit (INDEPENDENT_AMBULATORY_CARE_PROVIDER_SITE_OTHER): Payer: PPO | Admitting: Internal Medicine

## 2020-02-19 VITALS — BP 122/62 | HR 87 | Temp 97.2°F | Resp 16 | Ht 62.5 in | Wt 153.8 lb

## 2020-02-19 DIAGNOSIS — J45901 Unspecified asthma with (acute) exacerbation: Secondary | ICD-10-CM | POA: Diagnosis not present

## 2020-02-19 DIAGNOSIS — J209 Acute bronchitis, unspecified: Secondary | ICD-10-CM

## 2020-02-19 MED ORDER — DEXAMETHASONE 4 MG PO TABS: ORAL_TABLET | ORAL | 0 refills | Status: DC

## 2020-02-19 MED ORDER — AZITHROMYCIN 250 MG PO TABS: ORAL_TABLET | ORAL | 1 refills | Status: DC

## 2020-02-19 NOTE — Progress Notes (Signed)
History of Present Illness:         This very nice 83y.o.MWFwith HTN, ASCAD,HLD, COPD / Asthma,  Pre-Diabetes, GERD, Vitamin D Deficiency,  chronic pain from DJD/DDD, hx/o vertebral compression Fx's, recently hospitalized in August with pelvic acetabular Fx presents today with recent exacerbation of asthmatic Bronchitis over the last week. Denies dyspnea. Only Using Bevespi - Not her Advair - is aware to only use 1 or the Other . Reports scant amounts of yellowish  Sputum.    Medications  .  denosumab (PROLIA) 60 MG/ML SOSY injection, Inject 60 mg into the skin every 6 (six) months. .  calcitonin (salmon) (MIACALCIN/FORTICAL) nasal spray 1 spray .  amLODipine (NORVASC) 10 MG tablet, Take 1 tablet Daily for BP & Heart .  metoprolol tartrate (LOPRESSOR) 50 MG tablet, Take 1 tablet 2 x /day for BP & Heart (Patient taking differently: Take 25 mg by mouth at bedtime. ) .  nitroGLYCERIN (NITROSTAT) 0.4 MG SL tablet, Place 1 tablet (0.4 mg total) under the tongue every 5 (five) minutes as needed for chest pain.   .  ((ADVAIR DISKUS 250-50 , Inhale 1 puff  in the morning and at bedtime.)) .  BEVESPI AEROSPHERE, Inhale 1 puff daily. [name]  ipratropium-albuterol  / DUONEB , Take 3 mLs by nebevery 6  hours as needed   .  promethazine (PHENERGAN) 25 MG tablet, Take 1 tablet every 4 hours if needed for Nausea or Vomitting .  aspirin EC 81 MG tablet, Take 81 mg by mouth at bedtime.  .  traMADol (ULTRAM) 50 MG tablet, Take 2 tablets (100 mg total) by mouth every 6 (six) hours. .  vitamin B-12 (CYANOCOBALAMIN) 1000 MCG tablet, Take 1,000 mcg by mouth daily. [name]  ALPRAZolam (XANAX) 0.5 MG tablet, Take 0.5-1 tablets (0.25-0.5 mg total) by mouth daily as needed for anxiety. Take 1/2 tablet 1- 2 x /day ONLY if needed for Anxiety Attack &  limit to 5 days /week to avoid Addiction - DO NOT take with Hydrocodone or Codeine .  bisacodyl (DULCOLAX) 10 MG suppository, Place 1 suppository (10 mg total) rectally as  needed for moderate constipation. .  Cholecalciferol (VITAMIN D3) 5000 units CAPS, Take 5,000 Units by mouth 2 (two) times daily.  .  cyclobenzaprine (FLEXERIL) 5 MG tablet, Take 1 tablet (5 mg total) by mouth 3 (three) times daily as needed for muscle spasms. .  diclofenac sodium (VOLTAREN) 1 % GEL, Apply 2 g topically 4 (four) times daily. (Patient taking differently: Apply 2 g topically 4 (four) times daily as needed (pain). ) .  diclofenac Sodium (VOLTAREN) 1 % GEL, APPLY 4 GRAMS TOPICALLY 4 TIMES DAILY AS NEEDED FOR PAIN .  dicyclomine (BENTYL) 20 MG tablet, Take 1 tablet (20 mg total) by mouth 3 (three) times daily as needed for spasms. Take 1 tablet 3 x  /day before meals for Cramping, Nausea or Diarhea .  DULoxetine (CYMBALTA) 30 MG capsule, TAKE 1 CAPSULE BY MOUTH ONCE DAILY, FOR MOOD AND CHRONIC PAIN .  gabapentin (NEURONTIN) 300 MG capsule, Take 1 capsule (300 mg total) by mouth 4 (four) times daily. (Patient not taking: Reported on 01/21/2020) .  ketoconazole (NIZORAL) 2 % cream, Apply 1 application topically 2 x /day (Patient taking differently: Apply 1 application topically 2 (two) times daily. Apply 1 application topically 2 x /day) .  Magnesium 400 MG TABS, Take 400 mg by mouth 2 (two) times daily.  .  meclizine (ANTIVERT) 25 MG tablet, Take 1/2 to  1 tablet 2 to 3 x /day ONLY if needed  For Dizziness / Vertigo (Patient taking differently: Take 12.5-25 mg by mouth 3 (three) times daily as needed for dizziness. Take 1/2 to 1 tablet 2 to 3 x /day ONLY if needed  For Dizziness / Vertigo) .  methocarbamol (ROBAXIN) 500 MG tablet, Take 1 tablet (500 mg total) by mouth 4 (four) times daily. .  Multiple Vitamins-Minerals (MULTIVITAMIN WITH MINERALS) tablet, Take 1 tablet by mouth daily. [name]  omeprazole (PRILOSEC) 40 MG capsule, Take 1 capsule 2 x /day for Indigestion , Heartburn & Nausea (Patient taking differently: Take 40 mg by mouth 2 (two) times daily. Take 1 capsule 2 x /day for Indigestion ,  Heartburn & Nausea) .  polyethylene glycol powder (GLYCOLAX/MIRALAX) powder, Take 17 g by mouth 2 (two) times daily. (Patient taking differently: Take 17 g by mouth daily. ) .  prochlorperazine (COMPAZINE) 5 MG tablet, Take 1 tablet (5 mg total) by mouth every 6 (six) hours as needed for refractory nausea / vomiting. [name]  rOPINIRole (REQUIP) 2 MG tablet, Take 1 tablet (2 mg total) by mouth 3 (three) times daily. [name]  senna-docusate (SENOKOT-S) 8.6-50 MG tablet, Take 2 tablets by mouth 2 (two) times daily. .  sucralfate (CARAFATE) 1 g tablet, TAKE 1 TABLET BY MOUTH 4 TIMES DAILY BEFORE  MEALS  AND  AT  BEDTIME  FOR  INDIGESTION  AND  HEARTBURN .  trospium (SANCTURA) 20 MG tablet, Take 20 mg by mouth 2 (two) times daily.   Problem list She has Essential hypertension; Mild intermittent asthma without complication; ESOPHAGEAL STRICTURE; Incarcerated recurrent paraesophageal hiatal hernia s/p lap redo repair [id number]; Vertebral compression fracture (HCC); MGUS (monoclonal gammopathy of unknown significance); Prediabetes; Vitamin D deficiency; Osteoporosis; Coronary artery disease involving native heart without angina pectoris; Depression, major, in partial remission ([place]); Chronic pain syndrome; Hyperlipidemia, mixed; Gastroesophageal reflux disease; DDD (degenerative disc disease), lumbar; Spinal stenosis, lumbar region, with neurogenic claudication; Atherosclerosis of aorta ([place]); Chronic bronchitis (HCC); T12 compression fracture (HCC); CKD (chronic kidney disease) stage 2, GFR 60-89 ml/min; Abnormal glucose; FHx: heart disease; Senile purpura ([place]); Atherosclerosis of autologous vein coronary artery bypass graft with angina pectoris ([place]); Pubic ramus fracture ([place]); Multiple closed pelvic fractures without disruption of pelvic circle ([place]); Impaired glucose tolerance; History of fundoplication; and Insufficiency fracture of pelvis on their problem list.   Observations/Objective:   There were no vitals  taken for this visit.  HEENT - WNL. Neck - supple.  Chest -  BS = fairly clear with few forced  post - tussive end expiratory coarse wheezes.  Cor - Nl HS. RRR w/o sig MGR. PP 1(+). No edema. MS- FROM w/o deformities.  Gait supported with a walker Neuro -  Nl w/o focal abnormalities.  Assessment and Plan:   1. Acute bronchitis with asthma with acute exacerbation  - dexamethasone 4 mg; 1 tab 3 x day-3 days, then 2 x day-3 days, then 1 tab daily  Disp: 20 tabs    - azithromycin 250 mg; Take 2 tablets with Food-Day 1, then 1 tablet Daily-Disp: 6 each; Rfi: 1      Follow Up Instructions:       I discussed the assessment and treatment plan with the patient. The patient was provided an opportunity to ask questions and all were answered. The patient agreed with the plan and demonstrated an understanding of the instructions.      The patient was advised to call back or seek an in-person  evaluation if the symptoms worsen or if the condition fails to improve as anticipated.   Kirtland Bouchard, MD

## 2020-02-21 ENCOUNTER — Encounter: Payer: Self-pay | Admitting: Orthopaedic Surgery

## 2020-02-21 ENCOUNTER — Ambulatory Visit: Payer: Self-pay

## 2020-02-21 ENCOUNTER — Ambulatory Visit (INDEPENDENT_AMBULATORY_CARE_PROVIDER_SITE_OTHER): Payer: PPO | Admitting: Orthopaedic Surgery

## 2020-02-21 VITALS — BP 152/75 | HR 86 | Ht 62.5 in | Wt 153.0 lb

## 2020-02-21 DIAGNOSIS — S32591D Other specified fracture of right pubis, subsequent encounter for fracture with routine healing: Secondary | ICD-10-CM

## 2020-02-21 DIAGNOSIS — M25561 Pain in right knee: Secondary | ICD-10-CM | POA: Diagnosis not present

## 2020-02-21 NOTE — Progress Notes (Deleted)
Office Visit Note  Patient: [name] [name]             Date of Birth: 05-10-1935           MRN: [id number]             PCP: [name], MD Referring: [name], MD Visit Date: 03/06/2020 Occupation: @GUAROCC @  Subjective:  No chief complaint on file.   History of Present Illness: [name] [name] is a 84 y.o. female ***   Activities of Daily Living:  Patient reports morning stiffness for *** {minute/hour:19697}.   Patient {ACTIONS;DENIES/REPORTS:21021675::"Denies"} nocturnal pain.  Difficulty dressing/grooming: {ACTIONS;DENIES/REPORTS:21021675::"Denies"} Difficulty climbing stairs: {ACTIONS;DENIES/REPORTS:21021675::"Denies"} Difficulty getting out of chair: {ACTIONS;DENIES/REPORTS:21021675::"Denies"} Difficulty using hands for taps, buttons, cutlery, and/or writing: {ACTIONS;DENIES/REPORTS:21021675::"Denies"}  No Rheumatology ROS completed.   PMFS History:  Patient Active Problem List   Diagnosis Date Noted  . Insufficiency fracture of pelvis 12/29/2019    Class: Acute  . Multiple closed pelvic fractures without disruption of pelvic circle (HCC)   . Impaired glucose tolerance   . History of fundoplication   . Pubic ramus fracture ([place]) 12/20/2019  . Senile purpura ([place]) 07/05/2019  . Atherosclerosis of autologous vein coronary artery bypass graft with angina pectoris ([place]) 07/05/2019  . Abnormal glucose 03/01/2018  . FHx: heart disease 03/01/2018  . CKD (chronic kidney disease) stage 2, GFR 60-89 ml/min 09/27/2017  . T12 compression fracture ([place]) 09/26/2017  . Chronic bronchitis ([place]) 08/09/2017  . Atherosclerosis of aorta ([place]) 04/05/2017  . Spinal stenosis, lumbar region, with neurogenic claudication 05/29/2015  . DDD (degenerative disc disease), lumbar 04/21/2015  . Hyperlipidemia, mixed 11/24/2014  . Gastroesophageal reflux disease 11/24/2014  . Depression, major, in partial remission ([place]) 09/26/2014  . Chronic pain syndrome 09/26/2014  . Coronary  artery disease involving native heart without angina pectoris   . Osteoporosis 02/10/2014  . Prediabetes 08/14/2013  . Vitamin D deficiency 08/14/2013  . MGUS (monoclonal gammopathy of unknown significance) 03/05/2013  . Vertebral compression fracture ([place]) 06/23/2012  . ESOPHAGEAL STRICTURE 08/28/2008  . Mild intermittent asthma without complication 94/17/4081  . Incarcerated recurrent paraesophageal hiatal hernia s/p lap redo repair [id number] 01/31/2008  . Essential hypertension 07/25/2007    Past Medical History:  Diagnosis Date  . Anxiety    takes Xanax daily as needed  . Asthma    Albuterol daily as needed  . Blood transfusion without reported diagnosis   . CAD (coronary artery disease)    LHC 2003 with 40% pLAD, 30% mLAD, 100% D1 (moderate vessel), 100%  D2 (small vessel), 95% mid small OM2.  Pt had PTCA to D1;  D2 and OM2 small vessels and tx medically;  Last Myoview (2012): anterior infarct seen with scar, no ischemia. EF normal. Med Rx // Myoview (12/14):  Low risk; ant scar w/ peri-infarct ischemia; EF 55% with ant AK // Myoview 5/16: EF 55-65, ant, apical scar, no ischemia   . Cataract   . Chronic back pain    HNP, DDD, spinal stenosis, vertebral compression fractures.   . Chronic nausea    takes Zofran daily as needed.  normal gastric emptying study 03/2013  . CKD (chronic kidney disease), stage III ([place]) 09/27/2017  . Constipation    felt to be functional. takes Senokot and Miralax daily.  treated with Linzess in past.   . DIVERTICULOSIS, COLON 08/28/2008   Qualifier: Diagnosis of  By: [name], [name], [name]    . Elevated LFTs 2015   probably med induced DILI, from Augmentin vs Pravastatin  .  GERD (gastroesophageal reflux disease)    hx of esophageal stricture   . Hiatal hernia    Paraesophageal hernias. s/p fundoplications in 6767 and 04/2013  . History of bronchitis several of yrs ago  . History of colon polyps 03/2009, 03/2011   adenomatous colon polyps, no high grade  dysplasia.   [name] History of vertigo    no meds  . Hyperlipidemia    takes Pravastatin daily  . Hypertension    takes Amlodipine,Metoprolol,and Losartan daily  . Nausea 03/2016  . Osteoporosis   . PONV (postoperative nausea and vomiting)    yrs ago  . Slow urinary stream    takes Rapaflo daily  . TIA (transient ischemic attack) 1995   no residual effects noted    Family History  Problem Relation Age of Onset  . Colon cancer Mother 50  . Heart attack Father 68       heart attack  . Brain cancer Brother        tumor   . Heart disease Brother   . Heart disease Brother   . Kidney disease Daughter   . Esophageal cancer Neg Hx   . Stomach cancer Neg Hx    Past Surgical History:  Procedure Laterality Date  . APPENDECTOMY     patient unsure of date  . BACK SURGERY    . BLADDER REPAIR    . CHOLECYSTECTOMY     Patient unsure of date.  . CORONARY ANGIOPLASTY  11/16/2001   1 stent  . ESOPHAGEAL MANOMETRY N/A 03/25/2013   Procedure: ESOPHAGEAL MANOMETRY (EM);  Surgeon: Inda Castle, MD;  Location: [facility] END[facility]COPY;  Service: Endoscopy;  Laterality: N/A;  . ESOPHAGOGASTRODUODENOSCOPY N/A 03/15/2013   Procedure: ESOPHAGOGASTRODUODENOSCOPY (EGD);  Surgeon: Jerene Bears, MD;  Location[facility] ENDOSCOPY;  Service: Endoscopy;  Laterality: N/A;  . ESOPHAGOGASTRODUODENOSCOPY N/A 12/25/2015   Procedure: ESOPHAGOGASTRODUODENOSCOPY (EGD);  Surgeon: Doran Stabler, MD;  Location: [facility] ENDOSCOPY;  Service: Endoscopy;  Laterality: N/A;  . EYE SURGERY  11/2000   bilateral cataracts with lens implant  . FIXATION KYPHOPLASTY LUMBAR SPINE  X 2   "L3-4"  . HEMORRHOID SURGERY    . HIATAL HERNIA REPAIR  06/03/2011   Procedure: LAPAROSCOPIC REPAIR OF HIATAL HERNIA;  Surgeon: Adin Hector, MD;  Location: [facility] ORS;  Service: General;  Laterality: N/A;  . HIATAL HERNIA REPAIR N/A 04/24/2013   Procedure: LAPAROSCOPIC  REPAIR RECURRENT PARASOPHAGEAL HIATAL HERNIA WITH FUNDOPLICATION;  Surgeon: Adin Hector,  MD;  Location: [facility];  Service: General;  Laterality: N/A;  . INSERTION OF MESH N/A 04/24/2013   Procedure: INSERTION OF MESH;  Surgeon: Adin Hector, MD;  Location: [facility]S;  Service: General;  Laterality: N/A;  . IR KYPHO THORACIC WITH BONE BIOPSY  09/29/2017  . IR KYPHO THORACIC WITH BONE BIOPSY  11/03/2017  . IR KYPHO THORACIC WITH BONE BIOPSY  02/14/2019  . IR KYPHO THORACIC WITH BONE BIOPSY  04/02/2019  . LAPAROSCOPIC LYSIS OF ADHESIONS N/A 04/24/2013   Procedure: LAPAROSCOPIC LYSIS OF ADHESIONS;  Surgeon: Adin Hector, MD;  Location: [facility];  Service: General;  Laterality: N/A;  . LAPAROSCOPIC NISSEN FUNDOPLICATION  06/18/4707   Procedure: LAPAROSCOPIC NISSEN FUNDOPLICATION;  Surgeon: Adin Hector, MD;  Location: [facility];  Service: General;  Laterality: N/A;  . LUMBAR FUSION  12/07/2015   Right L2-3 facetectomy, posterolateral fusion L2-3 with pedicle screws, rods, local bone graft, Vivigen cancellous chips. Fusion extended to the L1 level with pedicle screws and  rods  . LUMBAR LAMINECTOMY/DECOMPRESSION MICRODISCECTOMY Right 06/26/2012   Procedure: LUMBAR LAMINECTOMY/DECOMPRESSION MICRODISCECTOMY;  Surgeon: Jessy Oto, MD;  Location: [place];  Service: Orthopedics;  Laterality: Right;  RIGHT L4-5 MICRODISCECTOMY  . LUMBAR LAMINECTOMY/DECOMPRESSION MICRODISCECTOMY N/A 05/29/2015   Procedure: RIGHT L2-3 MICRODISCECTOMY;  Surgeon: Jessy Oto, MD;  Location: [place];  Service: Orthopedics;  Laterality: N/A;  . TOTAL ABDOMINAL HYSTERECTOMY  1975   partial  . UPPER GASTROINTESTINAL ENDOSCOPY     Social History   Social History Narrative   Lives with husband.   Right-handed.   3 cups caffeine per day.   Immunization History  Administered Date(s) Administered  . DT (Pediatric) 05/06/2014  . Influenza Split 02/07/2011  . Influenza Whole 02/09/2009, 01/19/2010, 02/13/2012  . Influenza, High Dose Seasonal PF 03/22/2013, 01/15/2014, 02/27/2015, 01/14/2016, 01/18/2017, 02/14/2018,  02/14/2019  . PFIZER SARS-COV-2 Vaccination 05/25/2019, 06/15/2019  . Pneumococcal Conjugate-13 05/06/2014  . Pneumococcal Polysaccharide-23 02/09/2011  . Pneumococcal-Unspecified 02/06/2010, 02/09/2012  . Td 05/10/2003     Objective: Vital Signs: There were no vitals taken for this visit.   Physical Exam   Musculoskeletal Exam: ***  CDAI Exam: CDAI Score: -- Patient Global: --; Provider Global: -- Swollen: --; Tender: -- Joint Exam 03/06/2020   No joint exam has been documented for this visit   There is currently no information documented on the homunculus. Go to the Rheumatology activity and complete the homunculus joint exam.  Investigation: No additional findings.  Imaging: XR Pelvis 1-2 Views  Result Date: 01/22/2020 AP pelvis x-ray shows some sclerotic interval healing inferior superior rami with extension of the superior rami into the medial acetabulum without hip joint displacement. Impression: Right rami fractures with interval healing comparison to CT pelvis 12/20/2019.   Recent Labs: Lab Results  Component Value Date   WBC 10.2 01/21/2020   HGB 14.9 01/21/2020   PLT 546 (H) 01/21/2020   NA 141 01/21/2020   K 4.7 01/21/2020   CL 106 01/21/2020   CO2 26 01/21/2020   GLUCOSE 101 (H) 01/21/2020   BUN 12 01/21/2020   CREATININE 0.62 01/21/2020   BILITOT 0.4 01/29/2020   ALKPHOS 185 (H) 01/29/2020   AST 17 01/29/2020   ALT 11 01/29/2020   PROT 7.0 01/29/2020   ALBUMIN 3.9 01/29/2020   CALCIUM 9.5 01/21/2020   GFRAA 97 01/21/2020    Speciality Comments: No specialty comments available.  Procedures:  No procedures performed Allergies: Ace inhibitors, Aspartame, Atorvastatin, Biaxin [clarithromycin], Daliresp [roflumilast], Erythromycin, and Levofloxacin   Assessment / Plan:     Visit Diagnoses: No diagnosis found.  Orders: No orders of the defined types were placed in this encounter.  No orders of the defined types were placed in this  encounter.   Face-to-face time spent with patient was *** minutes. Greater than 50% of time was spent in counseling and coordination of care.  Follow-Up Instructions: No follow-ups on file.   Earnestine Mealing, CMA  Note - This record has been created using Editor, commissioning.  Chart creation errors have been sought, but may not always  have been located. Such creation errors do not reflect on  the standard of medical care.

## 2020-02-21 NOTE — Progress Notes (Signed)
Office Visit Note   Patient: [name] [name]           Date of Birth: 1935/10/19           MRN: [id number] Visit Date: 02/21/2020              Requested by: [name], [address] [place],  [place] [id number] PCP: [name], MD   Assessment & Plan: Visit Diagnoses:  1. Closed fracture of ramus of right pubis with routine healing, subsequent encounter   2. Acute pain of right knee     Plan: Patient is moving better than last month.  Pain is significantly improved some of the pain anteriorly in her thigh may be related to walking activity.  Pain is decreased as her fracture is progressively healed.  She is now 2 months.  Voltaren gel renewed.  We will check her back again on a as needed basis.  Follow-Up Instructions: No follow-ups on file.   Orders:  Orders Placed This Encounter  Procedures  . XR Pelvis 1-2 Views  . XR Knee 1-2 Views Right   No orders of the defined types were placed in this encounter.     Procedures: No procedures performed   Clinical Data: No additional findings.   Subjective: Chief Complaint  Patient presents with  . Right Hip - Fracture, Follow-up    HPI 84 year old female here with her husband with history of fall 12/20/2019 with rami fractures.  Previous upper lumbar instrumented fusion.  Vertebral plasties lumbar below the fusion.  She has had some right knee pain although she has full range of motion good strength and no swelling.  Some of this is anteriorly in the thigh.  She is not associated with any numbness.  She is used some Voltaren cream which is helped she is asked for refill.  She is also used some Tylenol and gabapentin.  Review of Systems updated unchanged from last office visit all other systems.   Objective: Vital Signs: BP (!) 152/75   Pulse 86   Ht 5' 2.5" (1.588 m)   Wt 153 lb (69.4 kg)   BMI 27.54 kg/m   Physical Exam Constitutional:      Appearance: She is well-developed.  HENT:      Head: Normocephalic.     Right Ear: External ear normal.     Left Ear: External ear normal.  Eyes:     Pupils: Pupils are equal, round, and reactive to light.  Neck:     Thyroid: No thyromegaly.     Trachea: No tracheal deviation.  Cardiovascular:     Rate and Rhythm: Normal rate.  Pulmonary:     Effort: Pulmonary effort is normal.  Abdominal:     Palpations: Abdomen is soft.  Skin:    General: Skin is warm and dry.  Neurological:     Mental Status: She is alert and oriented to person, place, and time.  Psychiatric:        Behavior: Behavior normal.     Ortho Exam patient has full knee range of motion no crepitus collateral ligaments are stable no joint line tenderness.  No popliteal palpable masses.  Distal pulses is present negative logroll to the hips. Specialty Comments:  No specialty comments available.  Imaging: No results found.   PMFS History: Patient Active Problem List   Diagnosis Date Noted  . Insufficiency fracture of pelvis 12/29/2019    Class: Acute  . Multiple closed pelvic fractures without  disruption of pelvic circle ([place])   . Impaired glucose tolerance   . History of fundoplication   . Pubic ramus fracture ([place]) 12/20/2019  . Senile purpura ([place]) 07/05/2019  . Atherosclerosis of autologous vein coronary artery bypass graft with angina pectoris ([place]) 07/05/2019  . Abnormal glucose 03/01/2018  . FHx: heart disease 03/01/2018  . CKD (chronic kidney disease) stage 2, GFR 60-89 ml/min 09/27/2017  . T12 compression fracture ([place]) 09/26/2017  . Chronic bronchitis ([place]) 08/09/2017  . Atherosclerosis of aorta ([place]) 04/05/2017  . Spinal stenosis, lumbar region, with neurogenic claudication 05/29/2015  . DDD (degenerative disc disease), lumbar 04/21/2015  . Hyperlipidemia, mixed 11/24/2014  . Gastroesophageal reflux disease 11/24/2014  . Depression, major, in partial remission ([place]) 09/26/2014  . Chronic pain syndrome 09/26/2014  . Coronary artery  disease involving native heart without angina pectoris   . Osteoporosis 02/10/2014  . Prediabetes 08/14/2013  . Vitamin D deficiency 08/14/2013  . MGUS (monoclonal gammopathy of unknown significance) 03/05/2013  . Vertebral compression fracture ([place]) 06/23/2012  . ESOPHAGEAL STRICTURE 08/28/2008  . Mild intermittent asthma without complication 96/28/3662  . Incarcerated recurrent paraesophageal hiatal hernia s/p lap redo repair [id number] 01/31/2008  . Essential hypertension 07/25/2007   Past Medical History:  Diagnosis Date  . Anxiety    takes Xanax daily as needed  . Asthma    Albuterol daily as needed  . Blood transfusion without reported diagnosis   . CAD (coronary artery disease)    LHC 2003 with 40% pLAD, 30% mLAD, 100% D1 (moderate vessel), 100%  D2 (small vessel), 95% mid small OM2.  Pt had PTCA to D1;  D2 and OM2 small vessels and tx medically;  Last Myoview (2012): anterior infarct seen with scar, no ischemia. EF normal. Med Rx // Myoview (12/14):  Low risk; ant scar w/ peri-infarct ischemia; EF 55% with ant AK // Myoview 5/16: EF 55-65, ant, apical scar, no ischemia   . Cataract   . Chronic back pain    HNP, DDD, spinal stenosis, vertebral compression fractures.   . Chronic nausea    takes Zofran daily as needed.  normal gastric emptying study 03/2013  . CKD (chronic kidney disease), stage III ([place]) 09/27/2017  . Constipation    felt to be functional. takes Senokot and Miralax daily.  treated with Linzess in past.   . DIVERTICULOSIS, COLON 08/28/2008   Qualifier: Diagnosis of  By: [name], [name], [name]    . Elevated LFTs 2015   probably med induced DILI, from Augmentin vs Pravastatin  . GERD (gastroesophageal reflux disease)    hx of esophageal stricture   . Hiatal hernia    Paraesophageal hernias. s/p fundoplications in 5035 and 04/2013  . History of bronchitis several of yrs ago  . History of colon polyps 03/2009, 03/2011   adenomatous colon polyps, no high grade  dysplasia.   [name] History of vertigo    no meds  . Hyperlipidemia    takes Pravastatin daily  . Hypertension    takes Amlodipine,Metoprolol,and Losartan daily  . Nausea 03/2016  . Osteoporosis   . PONV (postoperative nausea and vomiting)    yrs ago  . Slow urinary stream    takes Rapaflo daily  . TIA (transient ischemic attack) 1995   no residual effects noted    Family History  Problem Relation Age of Onset  . Colon cancer Mother 61  . Heart attack Father 80       heart attack  . Brain cancer Brother  tumor   . Heart disease Brother   . Heart disease Brother   . Kidney disease Daughter   . Esophageal cancer Neg Hx   . Stomach cancer Neg Hx     Past Surgical History:  Procedure Laterality Date  . APPENDECTOMY     patient unsure of date  . BACK SURGERY    . BLADDER REPAIR    . CHOLECYSTECTOMY     Patient unsure of date.  . CORONARY ANGIOPLASTY  11/16/2001   1 stent  . ESOPHAGEAL MANOMETRY N/A 03/25/2013   Procedure: ESOPHAGEAL MANOMETRY (EM);  Surgeon: Inda Castle, MD;  Location: [facility];  Service: Endoscopy;  Laterality: N/A;  . ESOPHAGOGASTRODUODENOSCOPY N/A 03/15/2013   Procedure: ESOPHAGOGASTRODUODENOSCOPY (EGD);  Surgeon: Jerene Bears, MD;  Location[facility] ENDOSCOPY;  Service: Endoscopy;  Laterality: N/A;  . ESOPHAGOGASTRODUODENOSCOPY N/A 12/25/2015   Procedure: ESOPHAGOGASTRODUODENOSCOPY (EGD);  Surgeon: Doran Stabler, MD;  Location[facility] ENDOSCOPY;  Service: Endoscopy;  Laterality: N/A;  . EYE SURGERY  11/2000   bilateral cataracts with lens implant  . FIXATION KYPHOPLASTY LUMBAR SPINE  X 2   "L3-4"  . HEMORRHOID SURGERY    . HIATAL HERNIA REPAIR  06/03/2011   Procedure: LAPAROSCOPIC REPAIR OF HIATAL HERNIA;  Surgeon: Adin Hector, MD;  Location: [facility];  Service: General;  Laterality: N/A;  . HIATAL HERNIA REPAIR N/A 04/24/2013   Procedure: LAPAROSCOPIC  REPAIR RECURRENT PARASOPHAGEAL HIATAL HERNIA WITH FUNDOPLICATION;  Surgeon: Adin Hector,  MD;  Location: [facility];  Service: General;  Laterality: N/A;  . INSERTION OF MESH N/A 04/24/2013   Procedure: INSERTION OF MESH;  Surgeon: Adin Hector, MD;  Location: [facility];  Service: General;  Laterality: N/A;  . IR KYPHO THORACIC WITH BONE BIOPSY  09/29/2017  . IR KYPHO THORACIC WITH BONE BIOPSY  11/03/2017  . IR KYPHO THORACIC WITH BONE BIOPSY  02/14/2019  . IR KYPHO THORACIC WITH BONE BIOPSY  04/02/2019  . LAPAROSCOPIC LYSIS OF ADHESIONS N/A 04/24/2013   Procedure: LAPAROSCOPIC LYSIS OF ADHESIONS;  Surgeon: Adin Hector, MD;  Location: [facility];  Service: General;  Laterality: N/A;  . LAPAROSCOPIC NISSEN FUNDOPLICATION  1/82/9937   Procedure: LAPAROSCOPIC NISSEN FUNDOPLICATION;  Surgeon: Adin Hector, MD;  Location: [facility];  Service: General;  Laterality: N/A;  . LUMBAR FUSION  12/07/2015   Right L2-3 facetectomy, posterolateral fusion L2-3 with pedicle screws, rods, local bone graft, Vivigen cancellous chips. Fusion extended to the L1 level with pedicle screws and rods  . LUMBAR LAMINECTOMY/DECOMPRESSION MICRODISCECTOMY Right 06/26/2012   Procedure: LUMBAR LAMINECTOMY/DECOMPRESSION MICRODISCECTOMY;  Surgeon: Jessy Oto, MD;  Location: [facility];  Service: Orthopedics;  Laterality: Right;  RIGHT L4-5 MICRODISCECTOMY  . LUMBAR LAMINECTOMY/DECOMPRESSION MICRODISCECTOMY N/A 05/29/2015   Procedure: RIGHT L2-3 MICRODISCECTOMY;  Surgeon: Jessy Oto, MD;  Location: [facility];  Service: Orthopedics;  Laterality: N/A;  . TOTAL ABDOMINAL HYSTERECTOMY  1975   partial  . UPPER GASTROINTESTINAL ENDOSCOPY     Social History   Occupational History  . Occupation: Retired    Fish farm manager: RETIRED    Comment: worked at [employer]  . Smoking status: Never Smoker  . Smokeless tobacco: Never Used  Vaping Use  . Vaping Use: Never used  Substance and Sexual Activity  . Alcohol use: No  . Drug use: Never  . Sexual activity: Not on file

## 2020-03-02 ENCOUNTER — Telehealth: Payer: Self-pay

## 2020-03-02 NOTE — Telephone Encounter (Signed)
-----   Message from Yevette Edwards, RN sent at 01/30/2020  9:14 AM EDT ----- Regarding: Labs Repeat LFTS, order in epic

## 2020-03-02 NOTE — Telephone Encounter (Signed)
Spoke with patient to remind her that she is due for repeat labs at this time. Advised patient that no appt is necessary, she is aware that she can go by at her convenience between 7:30 AM - 5 PM, Monday through Friday. Patient verbalized understanding and has no other concerns at this time.

## 2020-03-05 ENCOUNTER — Ambulatory Visit: Payer: PPO | Admitting: Gastroenterology

## 2020-03-05 NOTE — Progress Notes (Deleted)
HPI :  84 year old female here for a follow-up visit for abdominal pain.  She has a history of a remote TIA, history of paraesophageal hernias in the past repaired with fundoplication, history of chronic back pain with history of chronic narcotic use, here for follow-up visit.  I have seen her in the past for a variety of pain and symptoms.  She is here for a follow-up visit after experiencing upper abdominal pain that led her to the emergency department on February 1.  She states she has been having some upper abdominal pain localized in her epigastric area and radiates into her lateral sides and also into her mid back.  She states has been going on for a few weeks but getting worse over time.  On questioning if this is just been ongoing for a few weeks or longer, she states she is thinks he has had this pain in the past for a long time but is unable to say exactly how long.  In the emergency room she had an ultrasound and a CT scan as outlined below, no clear cause for her symptoms.  She had normal CBC, LFTs, lipase.  She was instructed to follow-up with [place] for further management.  She states the pain seems to be there all the time and never really goes away.  She has a constant level of pain that rated 8 out of 10.  She has a stabbing pain that can come and go along with this but symptoms seem to be about the same.  She has no problems eating at all.  No vomiting.  She is eating meals regularly without making the symptoms worse.  She has had some baseline constipation for which she takes MiraLAX.  We previously had given her a trial of Amitiza as well as Movantik in light of her narcotic use in the past.  She cannot recall how those regimens made her feel and is unclear how she responded to them.  She denies any reflux symptoms its been bothering her at all.  She has been on omeprazole 40 mg twice a day and taking Carafate as needed in the past.  She states Carafate has not helped the pain at  all.  I have reviewed her prior radiology studies.  In the ER her CT scan showed stable mild biliary ductal dilation in the setting of postcholecystectomy state.  Ultrasound showed the duct to be about 11.3 mm.  In review of her scans in the past, she has had numerous CT scans of her abdomen and pelvis as well as ultrasounds.  Dating back to 2001 she has had some mild prominence of her biliary tree.  Her LFTs and lipase have been stable, this is been thought to be related to postcholecystectomy state.  Of note she follows with Dr. Hardin Negus of chronic pain management we have had discussions about her narcotic use in the past and that it may be best to get her off the chronic regimen for her low back pain.  She has done that in recent weeks and has come off the narcotics altogether.  She has been maintained on gabapentin for this.  She thinks her pain in her upper belly and mid back started around the time she stopped her narcotics.   Most recent workup: CT scan 06/10/2019 - IMPRESSION: 1. No CT evidence for acute intra-abdominal or pelvic process. 2. Colonic diverticulosis without evidence for acute diverticulitis. 3. Status post cholecystectomy with chronic intra and extrahepatic biliary dilatation, stable.  4. Chronic compression deformities with sequelae of prior vertebral augmentation throughout the visualized thoracolumbar spine. 5. Aortic Atherosclerosis (ICD10-I70.0).  [place] 06/10/19 -  IMPRESSION: 1. Chronic post cholecystectomy intra and extrahepatic biliary Dilatation. CBD 11.64mm 2. Otherwise, unremarkable abdominal ultrasound examination.  CT scan 05/12/18 -  IMPRESSION: 1. No acute findings. No findings to account abdominal pain. 2. Chronic findings include chronic intra and extrahepatic bile duct dilation, aortic atherosclerosis, and multiple vertebral fractures. CBD 55mm  Colonoscopy 05/17/2017 - The perianal and digital rectal examinations were normal. - A 4 mm polyp was  found in the cecum. The polyp was sessile. The polyp was removed with a cold snare. Resection and retrieval were complete. - Two sessile polyps were found in the ascending colon. The polyps were 4 to 5 mm in size. These polyps were removed with a cold snare. Resection and retrieval were complete. - Two sessile polyps were found in the transverse colon. The polyps were 4 to 5 mm in size. These polyps were removed with a cold snare. Resection and retrieval were complete. - A 6 mm polyp was found in the descending colon. The polyp was sessile. The polyp was removed with a cold snare. Resection and retrieval were complete. - Scattered medium-mouthed diverticula were found in the entire colon. - Internal hemorrhoids were found during retroflexion. - The colon was tortous. The exam was otherwise without abnormality.  EGD 02/20/2018 - Esophagogastric landmarks were identified: the Z-line was found at 36 cm. - The lower esophagus was tortuous with an angulated turn into the fundoplication. No obvious stenosis was appreciated. - A TTS dilator was passed through the scope. Dilation with a 16-17-18 mm balloon dilator was performed to 16 mm, 17 mm and 18 mm at the gastroesophageal junction without any appreciable mucosal wrent. - Multiple diminutive white plaques were found in the lower third of the esophagus. Brushings for KOH prep were obtained. - The exam of the esophagus was otherwise normal. - Evidence of a Nissen fundoplication was found in the cardia, with a retained suture visible. The wrap was intact. - Patchy inflammation characterized by erythema was found in the gastric antrum. Biopsies were taken with a cold forceps for histology. - The exam of the stomach was otherwise normal. - The duodenal bulb and second portion of the duodenum were normal.  EGD 01/24/2018 - Z-line, 35 cm from the incisors. - Suspect mild esophagitis dissecans - Tortous esophagus with angulated entrance to  fundoplication versus mild stenosis - dilation not performed given volume of food in the stomach - A large amount of food (residue) in the stomach, the procedure was aborted.   EGD 12/25/2015 - Normal exam, fundoplication noted - per Dr. Loletha Carrow Esophageal manometry 08/15/2015 - intact peristalsis, normal LES relaxation, intermittent nonspecific spasm EGD 03/15/2013 - esophageal candidiasis, recurrent 4-5 cm hernia, history of Nissen with [name] lesion Colonoscopy 04/07/2011 - 2 small adenomatous polyps, internal hemorrhoids,   CT angio abdomen / pelvis - 04/05/2017 - normal CT abdomen / pelvis 03/10/2017 - mild stable CBD dilation 44mm post cholecystectomy CT abdomen pelvis 03/27/2016 - normal abdomen,  Barium swallow 12/24/15 - stricture in distal 3rd of esophagus, nonspecific dysmotility Gastric emptying study 03/2013 - normal   84 year old female here for reassessment of following issues:  Abdominal pain / chronic back pain - detailed history as above.  Her imaging does not show any new or focal abnormalities to account for her symptoms.  Her LFTs and lipase are normal as well as CBC.  I discussed  with her that she has some mild dilation of the biliary tree postcholecystectomy, this appears benign, has been present chronically for several years now without any interval changes that are concerning.  I do not think this is causing her symptoms at present.  She has a significant amount of back pain, I am wondering if she has neuropathic pain/musculoskeletal etiology causing her abdominal pain.  Her Carnett's sign is positive.  Her pain started/got worse around the time of stopping her narcotics which could have unmasked worsening pain.  Unfortunately gabapentin has not provided benefit.  I do think she should follow-up with her pain management physician to discuss other options, including trigger point injection or other medications such as Cymbalta etc.  I have reassured her about her imaging and  lab findings.  We discussed the role of MRCP to further evaluate the biliary tree if needed, I think that is unlikely to show [place] anything concerning or cause for her pain, she is also having difficulty paying the bills for her prior MRIs of her back, and will hold off on imaging at this time.  The pain does not appear consistent with reflux and given the characteristic of it not related to her prandial state at all, I think the likelihood of EGD finding in etiology is lower.  Spent several minutes discussing the situation with them and they understood and agreed to follow-up with pain management.  If symptoms persist or change, I asked her to contact me for reassessment.  She agreed  Abnormal findings on imaging - as above, stable mild dilation of the biliary tree over several years, normal LFTs / lipase, likely related to post cholecystectomy state.  Chronic constipation - I do not think causing her pain but I do think she warrants more aggressive regimen and as she still has some constipation despite MiraLAX a few times a day.  We will give her a trial of Linzess 72 mcg a day.  If this is not strong enough she can increase to 145 mcg.  If she is happy with this regimen and wants a prescription for she should contact us.  She agreed  I spent 45 minutes of time, including in depth chart review, independent review of results as outlined above, communicating results with the patient directly, face-to-face time with the patient, coordinating care, and ordering studies and medications as appropriate, and documenting this encounter.   Cellar, MD [facility]    EGD 07/11/19 - The Z-line was regular. - Patchy, white plaques were found in the middle third of the esophagus and in the lower third of the esophagus consistent with esophageal candidiasis. - The lower third of the esophagus was tortuous with an angulated turn into the Nissen. No obvious stenosis noted at the GEJ  (previously had this in 2019 and dilated) - The exam of the esophagus was otherwise normal. - Evidence of a Nissen fundoplication was found in the cardia. The wrap appeared intact. A retained suture was present. I attempted to remove it with forceps but it was buried into the mucosa and not removed. - The exam of the stomach was otherwise normal. - Biopsies were taken with a cold forceps in the gastric body, at the incisura and in the gastric antrum for Helicobacter pylori testing. - The duodenal bulb and second portion of the duodenum were normal.  Treated for candidiasis  Called about constipation regimen and cymbalta as well       Past Medical History:  Diagnosis Date  .  Anxiety    takes Xanax daily as needed  . Asthma    Albuterol daily as needed  . Blood transfusion without reported diagnosis   . CAD (coronary artery disease)    LHC 2003 with 40% pLAD, 30% mLAD, 100% D1 (moderate vessel), 100%  D2 (small vessel), 95% mid small OM2.  Pt had PTCA to D1;  D2 and OM2 small vessels and tx medically;  Last Myoview (2012): anterior infarct seen with scar, no ischemia. EF normal. Med Rx // Myoview (12/14):  Low risk; ant scar w/ peri-infarct ischemia; EF 55% with ant AK // Myoview 5/16: EF 55-65, ant, apical scar, no ischemia   . Cataract   . Chronic back pain    HNP, DDD, spinal stenosis, vertebral compression fractures.   . Chronic nausea    takes Zofran daily as needed.  normal gastric emptying study 03/2013  . CKD (chronic kidney disease), stage III ([place]) 09/27/2017  . Constipation    felt to be functional. takes Senokot and Miralax daily.  treated with Linzess in past.   . DIVERTICULOSIS, COLON 08/28/2008   Qualifier: Diagnosis of  By: [name], [name], [name]    . Elevated LFTs 2015   probably med induced DILI, from Augmentin vs Pravastatin  . GERD (gastroesophageal reflux disease)    hx of esophageal stricture   . Hiatal hernia    Paraesophageal hernias. s/p fundoplications  in 6067 and 04/2013  . History of bronchitis several of yrs ago  . History of colon polyps 03/2009, 03/2011   adenomatous colon polyps, no high grade dysplasia.   [name] History of vertigo    no meds  . Hyperlipidemia    takes Pravastatin daily  . Hypertension    takes Amlodipine,Metoprolol,and Losartan daily  . Nausea 03/2016  . Osteoporosis   . PONV (postoperative nausea and vomiting)    yrs ago  . Slow urinary stream    takes Rapaflo daily  . TIA (transient ischemic attack) 1995   no residual effects noted     Past Surgical History:  Procedure Laterality Date  . APPENDECTOMY     patient unsure of date  . BACK SURGERY    . BLADDER REPAIR    . CHOLECYSTECTOMY     Patient unsure of date.  . CORONARY ANGIOPLASTY  11/16/2001   1 stent  . ESOPHAGEAL MANOMETRY N/A 03/25/2013   Procedure: ESOPHAGEAL MANOMETRY (EM);  Surgeon: Inda Castle, MD;  Location: [facility];  Service: Endoscopy;  Laterality: N/A;  . ESOPHAGOGASTRODUODENOSCOPY N/A 03/15/2013   Procedure: ESOPHAGOGASTRODUODENOSCOPY (EGD);  Surgeon: Jerene Bears, MD;  Location[facility] END[facility]COPY;  Service: Endoscopy;  Laterality: N/A;  . ESOPHAGOGASTRODUODENOSCOPY N/A 12/25/2015   Procedure: ESOPHAGOGASTRODUODENOSCOPY (EGD);  Surgeon: Doran Stabler, MD;  Location: [facility] ENDOSCOPY;  Service: Endoscopy;  Laterality: N/A;  . EYE SURGERY  11/2000   bilateral cataracts with lens implant  . FIXATION KYPHOPLASTY LUMBAR SPINE  X 2   "L3-4"  . HEMORRHOID SURGERY    . HIATAL HERNIA REPAIR  06/03/2011   Procedure: LAPAROSCOPIC REPAIR OF HIATAL HERNIA;  Surgeon: Adin Hector, MD;  Location: [facility]S;  Service: General;  Laterality: N/A;  . HIATAL HERNIA REPAIR N/A 04/24/2013   Procedure: LAPAROSCOPIC  REPAIR RECURRENT PARASOPHAGEAL HIATAL HERNIA WITH FUNDOPLICATION;  Surgeon: Adin Hector, MD;  Location: [facility];  Service: General;  Laterality: N/A;  . INSERTION OF MESH N/A 04/24/2013   Procedure: INSERTION OF MESH;  Surgeon: Adin Hector, MD;  Location:  [facility];  Service: General;  Laterality: N/A;  . IR KYPHO THORACIC WITH BONE BIOPSY  09/29/2017  . IR KYPHO THORACIC WITH BONE BIOPSY  11/03/2017  . IR KYPHO THORACIC WITH BONE BIOPSY  02/14/2019  . IR KYPHO THORACIC WITH BONE BIOPSY  04/02/2019  . LAPAROSCOPIC LYSIS OF ADHESIONS N/A 04/24/2013   Procedure: LAPAROSCOPIC LYSIS OF ADHESIONS;  Surgeon: Adin Hector, MD;  Location: [facility];  Service: General;  Laterality: N/A;  . LAPAROSCOPIC NISSEN FUNDOPLICATION  9/62/9528   Procedure: LAPAROSCOPIC NISSEN FUNDOPLICATION;  Surgeon: Adin Hector, MD;  Location: [facility];  Service: General;  Laterality: N/A;  . LUMBAR FUSION  12/07/2015   Right L2-3 facetectomy, posterolateral fusion L2-3 with pedicle screws, rods, local bone graft, Vivigen cancellous chips. Fusion extended to the L1 level with pedicle screws and rods  . LUMBAR LAMINECTOMY/DECOMPRESSION MICRODISCECTOMY Right 06/26/2012   Procedure: LUMBAR LAMINECTOMY/DECOMPRESSION MICRODISCECTOMY;  Surgeon: Jessy Oto, MD;  Location: [place];  Service: Orthopedics;  Laterality: Right;  RIGHT L4-5 MICRODISCECTOMY  . LUMBAR LAMINECTOMY/DECOMPRESSION MICRODISCECTOMY N/A 05/29/2015   Procedure: RIGHT L2-3 MICRODISCECTOMY;  Surgeon: Jessy Oto, MD;  Location: [place];  Service: Orthopedics;  Laterality: N/A;  . TOTAL ABDOMINAL HYSTERECTOMY  1975   partial  . UPPER GASTROINTESTINAL ENDOSCOPY     Family History  Problem Relation Age of Onset  . Colon cancer Mother 46  . Heart attack Father 69       heart attack  . Brain cancer Brother        tumor   . Heart disease Brother   . Heart disease Brother   . Kidney disease Daughter   . Esophageal cancer Neg Hx   . Stomach cancer Neg Hx    Social History   Tobacco Use  . Smoking status: Never Smoker  . Smokeless tobacco: Never Used  Vaping Use  . Vaping Use: Never used  Substance Use Topics  . Alcohol use: No  . Drug use: Never   Current Outpatient Medications   Medication Sig Dispense Refill  . ALPRAZolam (XANAX) 0.5 MG tablet Take 0.5-1 tablets (0.25-0.5 mg total) by mouth daily as needed for anxiety. Take 1/2 tablet 1- 2 x /day ONLY if needed for Anxiety Attack &  limit to 5 days /week to avoid Addiction - DO NOT take with Hydrocodone or Codeine 10 tablet 0  . amLODipine (NORVASC) 10 MG tablet Take 1 tablet Daily for BP & Heart 90 tablet 3  . aspirin EC 81 MG tablet Take 81 mg by mouth at bedtime.     [name] azithromycin (ZITHROMAX) 250 MG tablet Take 2 tablets with Food on  Day 1, then 1 tablet Daily with Food for Infection 6 each 1  . Cholecalciferol (VITAMIN D3) 5000 units CAPS Take 5,000 Units by mouth 2 (two) times daily.     . cyclobenzaprine (FLEXERIL) 5 MG tablet Take 1 tablet (5 mg total) by mouth 3 (three) times daily as needed for muscle spasms. 20 tablet 0  . denosumab (PROLIA) 60 MG/ML SOSY injection Inject 60 mg into the skin every 6 (six) months. 1 mL 0  . dexamethasone (DECADRON) 4 MG tablet Take 1 tab 3 x day - 3 days, then 2 x day - 3 days, then 1 tab daily 20 tablet 0  . diclofenac sodium (VOLTAREN) 1 % GEL Apply 2 g topically 4 (four) times daily. (Patient taking differently: Apply 2 g topically 4 (four) times daily as needed (pain). ) 1 Tube 1  .  diclofenac Sodium (VOLTAREN) 1 % GEL APPLY 4 GRAMS TOPICALLY 4 TIMES DAILY AS NEEDED FOR PAIN 500 g 0  . dicyclomine (BENTYL) 20 MG tablet Take 1 tablet (20 mg total) by mouth 3 (three) times daily as needed for spasms. Take 1 tablet 3 x  /day before meals for Cramping, Nausea or Diarhea 10 tablet 0  . DULoxetine (CYMBALTA) 30 MG capsule TAKE 1 CAPSULE BY MOUTH ONCE DAILY, FOR MOOD AND CHRONIC PAIN 90 capsule 1  . Fluticasone-Salmeterol (ADVAIR DISKUS) 250-50 MCG/DOSE AEPB Inhale 1 puff into the lungs in the morning and at bedtime. 60 each 3  . gabapentin (NEURONTIN) 300 MG capsule Take 1 capsule (300 mg total) by mouth 4 (four) times daily. 20 capsule 0  . Glycopyrrolate-Formoterol (BEVESPI  AEROSPHERE) 9-4.8 MCG/ACT AERO Inhale 1 puff into the lungs daily.    [name] ipratropium-albuterol (DUONEB) 0.5-2.5 (3) MG/3ML SOLN Take 3 mLs by nebulization every 6 (six) hours as needed (shortness of breath/wheezing).     [name] ketoconazole (NIZORAL) 2 % cream Apply 1 application topically 2 x /day (Patient taking differently: Apply 1 application topically 2 (two) times daily. Apply 1 application topically 2 x /day) 60 g 3  . Magnesium 400 MG TABS Take 400 mg by mouth 2 (two) times daily.     . meclizine (ANTIVERT) 25 MG tablet Take 1/2 to 1 tablet 2 to 3 x /day ONLY if needed  For Dizziness / Vertigo (Patient taking differently: Take 12.5-25 mg by mouth 3 (three) times daily as needed for dizziness. Take 1/2 to 1 tablet 2 to 3 x /day ONLY if needed  For Dizziness / Vertigo) 270 tablet 1  . metoprolol tartrate (LOPRESSOR) 50 MG tablet Take 1 tablet 2 x /day for BP & Heart (Patient taking differently: Take 25 mg by mouth at bedtime. ) 180 tablet 3  . Multiple Vitamins-Minerals (MULTIVITAMIN WITH MINERALS) tablet Take 1 tablet by mouth daily.    . nitroGLYCERIN (NITROSTAT) 0.4 MG SL tablet Place 1 tablet (0.4 mg total) under the tongue every 5 (five) minutes as needed for chest pain. 90 tablet 3  . omeprazole (PRILOSEC) 40 MG capsule Take 1 capsule 2 x /day for Indigestion , Heartburn & Nausea (Patient taking differently: Take 40 mg by mouth 2 (two) times daily. Take 1 capsule 2 x /day for Indigestion , Heartburn & Nausea) 180 capsule 3  . polyethylene glycol powder (GLYCOLAX/MIRALAX) powder Take 17 g by mouth 2 (two) times daily. (Patient taking differently: Take 17 g by mouth daily. ) 527 g 0  . prochlorperazine (COMPAZINE) 5 MG tablet Take 1 tablet (5 mg total) by mouth every 6 (six) hours as needed for refractory nausea / vomiting. (Patient not taking: Reported on 02/19/2020) 10 tablet 0  . promethazine (PHENERGAN) 25 MG tablet Take 1 tablet every 4 hours if needed for Nausea or Vomitting 50 tablet 0  .  rOPINIRole (REQUIP) 2 MG tablet Take 1 tablet (2 mg total) by mouth 3 (three) times daily. 30 tablet 0  . senna-docusate (SENOKOT-S) 8.6-50 MG tablet Take 2 tablets by mouth 2 (two) times daily. 60 tablet 1  . sucralfate (CARAFATE) 1 g tablet TAKE 1 TABLET BY MOUTH 4 TIMES DAILY BEFORE  MEALS  AND  AT  BEDTIME  FOR  INDIGESTION  AND  HEARTBURN 360 tablet 0  . traMADol (ULTRAM) 50 MG tablet Take 2 tablets (100 mg total) by mouth every 6 (six) hours. 20 tablet 0  . trospium (SANCTURA) 20 MG tablet  Take 20 mg by mouth 2 (two) times daily.    . vitamin B-12 (CYANOCOBALAMIN) 1000 MCG tablet Take 1,000 mcg by mouth daily.     Current Facility-Administered Medications  Medication Dose Route Frequency Provider Last Rate Last Admin  . calcitonin (salmon) (MIACALCIN/FORTICAL) nasal spray 1 spray  1 spray Alternating Nares Daily Jessy Oto, MD       Allergies  Allergen Reactions  . Ace Inhibitors Cough    Per Dr Melvyn Novas pulmonology 2013  . Aspartame Diarrhea and Nausea And Vomiting  . Atorvastatin Other (See Comments)    Muscle aches  . Biaxin [Clarithromycin] Other (See Comments)    Unknown reaction : PATIENT CAN TOLERATE Z-PAK.  Is written on patient's paper chart.  [name] [Roflumilast] Nausea And Vomiting  . Erythromycin Other (See Comments)    Unknown reaction: PATIENT CAN TOLERATE Z-PAK.  It is written on patient's paper chart.  . Levofloxacin Nausea And Vomiting     Review of Systems: All systems reviewed and negative except where noted in HPI.    XR Knee 1-2 Views Right  Result Date: 02/21/2020 Standing AP both knees lateral right knee obtained and reviewed.  This shows trace chondrocalcinosis of the meniscus medial and lateral on the right knee and medial on the left knee.  No acute bony changes.  No significant joint space narrowing. Impression: Right knee x-rays negative for acute bone abnormality.  Slight meniscal chondrocalcinosis.  XR Pelvis 1-2 Views  Result Date:  02/21/2020 Pelvis x-ray obtained and reviewed this shows rami fractures right and left superior and inferior rami with sclerotic interval healing.  Comparison to 01/22/2020 images. Impression: Healing pelvic rami fractures.  Lumbar vertebroplasty noted   Physical Exam: There were no vitals taken for this visit. Constitutional: Pleasant,well-developed, ***female in no acute distress. HEENT: Normocephalic and atraumatic. Conjunctivae are normal. No scleral icterus. Neck supple.  Cardiovascular: Normal rate, regular rhythm.  Pulmonary/chest: Effort normal and breath sounds normal. No wheezing, rales or rhonchi. Abdominal: Soft, nondistended, nontender. Bowel sounds active throughout. There are no masses palpable. No hepatomegaly. Extremities: no edema Lymphadenopathy: No cervical adenopathy noted. Neurological: Alert and oriented to person place and time. Skin: Skin is warm and dry. No rashes noted. Psychiatric: Normal mood and affect. Behavior is normal.   ASSESSMENT AND PLAN:  [name], MD

## 2020-03-06 ENCOUNTER — Ambulatory Visit: Payer: PPO | Admitting: Physician Assistant

## 2020-03-06 DIAGNOSIS — Z8679 Personal history of other diseases of the circulatory system: Secondary | ICD-10-CM

## 2020-03-06 DIAGNOSIS — M810 Age-related osteoporosis without current pathological fracture: Secondary | ICD-10-CM

## 2020-03-06 DIAGNOSIS — Z9049 Acquired absence of other specified parts of digestive tract: Secondary | ICD-10-CM

## 2020-03-06 DIAGNOSIS — M170 Bilateral primary osteoarthritis of knee: Secondary | ICD-10-CM

## 2020-03-06 DIAGNOSIS — Z8719 Personal history of other diseases of the digestive system: Secondary | ICD-10-CM

## 2020-03-06 DIAGNOSIS — M5136 Other intervertebral disc degeneration, lumbar region: Secondary | ICD-10-CM

## 2020-03-06 DIAGNOSIS — Z8639 Personal history of other endocrine, nutritional and metabolic disease: Secondary | ICD-10-CM

## 2020-03-06 DIAGNOSIS — Z8669 Personal history of other diseases of the nervous system and sense organs: Secondary | ICD-10-CM

## 2020-03-06 DIAGNOSIS — Z8709 Personal history of other diseases of the respiratory system: Secondary | ICD-10-CM

## 2020-03-06 DIAGNOSIS — Z8659 Personal history of other mental and behavioral disorders: Secondary | ICD-10-CM

## 2020-03-06 DIAGNOSIS — Z8781 Personal history of (healed) traumatic fracture: Secondary | ICD-10-CM

## 2020-03-06 DIAGNOSIS — Z9889 Other specified postprocedural states: Secondary | ICD-10-CM

## 2020-03-06 DIAGNOSIS — M47819 Spondylosis without myelopathy or radiculopathy, site unspecified: Secondary | ICD-10-CM

## 2020-03-11 ENCOUNTER — Ambulatory Visit: Payer: PPO | Admitting: Physician Assistant

## 2020-03-16 ENCOUNTER — Other Ambulatory Visit: Payer: Self-pay | Admitting: *Deleted

## 2020-03-16 ENCOUNTER — Telehealth: Payer: Self-pay | Admitting: Pharmacy Technician

## 2020-03-16 ENCOUNTER — Ambulatory Visit (INDEPENDENT_AMBULATORY_CARE_PROVIDER_SITE_OTHER): Payer: PPO | Admitting: *Deleted

## 2020-03-16 ENCOUNTER — Other Ambulatory Visit: Payer: Self-pay

## 2020-03-16 VITALS — Temp 97.4°F

## 2020-03-16 DIAGNOSIS — Z79899 Other long term (current) drug therapy: Secondary | ICD-10-CM

## 2020-03-16 DIAGNOSIS — M810 Age-related osteoporosis without current pathological fracture: Secondary | ICD-10-CM | POA: Diagnosis not present

## 2020-03-16 DIAGNOSIS — Z23 Encounter for immunization: Secondary | ICD-10-CM

## 2020-03-16 NOTE — Telephone Encounter (Signed)
Received message from pharmacy that patient's next prolia should be due soon.

## 2020-03-16 NOTE — Telephone Encounter (Signed)
Patient advised she is due for her Prolia soon and will need labs prior to having the Prolia injection. Patient will come into the office today for labs.

## 2020-03-17 LAB — CBC WITH DIFFERENTIAL/PLATELET
Absolute Monocytes: 511 cells/uL (ref 200–950)
Basophils Absolute: 43 cells/uL (ref 0–200)
Basophils Relative: 0.6 %
Eosinophils Absolute: 263 cells/uL (ref 15–500)
Eosinophils Relative: 3.7 %
HCT: 44.1 % (ref 35.0–45.0)
Hemoglobin: 14.8 g/dL (ref 11.7–15.5)
Lymphs Abs: 2528 cells/uL (ref 850–3900)
MCH: 30.2 pg (ref 27.0–33.0)
MCHC: 33.6 g/dL (ref 32.0–36.0)
MCV: 90 fL (ref 80.0–100.0)
MPV: 10.4 fL (ref 7.5–12.5)
Monocytes Relative: 7.2 %
Neutro Abs: 3756 cells/uL (ref 1500–7800)
Neutrophils Relative %: 52.9 %
Platelets: 408 10*3/uL — ABNORMAL HIGH (ref 140–400)
RBC: 4.9 10*6/uL (ref 3.80–5.10)
RDW: 14.4 % (ref 11.0–15.0)
Total Lymphocyte: 35.6 %
WBC: 7.1 10*3/uL (ref 3.8–10.8)

## 2020-03-17 LAB — COMPLETE METABOLIC PANEL WITH GFR
AG Ratio: 1.5 (calc) (ref 1.0–2.5)
ALT: 14 U/L (ref 6–29)
AST: 16 U/L (ref 10–35)
Albumin: 4 g/dL (ref 3.6–5.1)
Alkaline phosphatase (APISO): 131 U/L (ref 37–153)
BUN: 21 mg/dL (ref 7–25)
CO2: 25 mmol/L (ref 20–32)
Calcium: 9.4 mg/dL (ref 8.6–10.4)
Chloride: 104 mmol/L (ref 98–110)
Creat: 0.86 mg/dL (ref 0.60–0.88)
GFR, Est African American: 73 mL/min/{1.73_m2} (ref >=60)
GFR, Est Non African American: 63 mL/min/{1.73_m2} (ref >=60)
Globulin: 2.7 g/dL (calc) (ref 1.9–3.7)
Glucose, Bld: 89 mg/dL (ref 65–139)
Potassium: 4.5 mmol/L (ref 3.5–5.3)
Sodium: 139 mmol/L (ref 135–146)
Total Bilirubin: 0.4 mg/dL (ref 0.2–1.2)
Total Protein: 6.7 g/dL (ref 6.1–8.1)

## 2020-03-17 NOTE — Telephone Encounter (Signed)
Patient asked about her Prolia being covered under the grant. Reached out to Rachael to inquire. Rachael states the grant for this patient ran out in September and there are nor grants open. She is going to speak with the patient and advise whether the patient is wanting the prescription sent in. Please wait to send prescription until then. Thanks!

## 2020-03-17 NOTE — Addendum Note (Signed)
Addended by: Carole Binning on: 03/17/2020 02:16 PM   Modules accepted: Orders

## 2020-03-17 NOTE — Telephone Encounter (Signed)
Labs resulted: 03/16/2020: CMP WNL. Plt count is borderline elevated but has improved significantly. Rest of CBC WNL.  Last Visit: 08/20/2019 Next Visit: 03/20/2020  Okay to refill Prolia?

## 2020-03-17 NOTE — Addendum Note (Signed)
Addended by: Carole Binning on: 03/17/2020 02:15 PM   Modules accepted: Orders

## 2020-03-18 ENCOUNTER — Other Ambulatory Visit: Payer: Self-pay | Admitting: Physician Assistant

## 2020-03-18 ENCOUNTER — Other Ambulatory Visit: Payer: Self-pay | Admitting: Internal Medicine

## 2020-03-18 DIAGNOSIS — M810 Age-related osteoporosis without current pathological fracture: Secondary | ICD-10-CM

## 2020-03-18 DIAGNOSIS — R112 Nausea with vomiting, unspecified: Secondary | ICD-10-CM

## 2020-03-18 NOTE — Telephone Encounter (Signed)
Placed order for prolia at [facility].

## 2020-03-18 NOTE — Telephone Encounter (Signed)
Patient's grant expired September 2021. Patient's copay through her pharmacy benefit is $310.75.  Patient wanted to see if pharmacy could do payment plan. I reached out and they are unable to. Advised patient, she could get injection through her medical benefit and make a payment plan with the hospital billing department. Patient agreed.  Called [employer] to check Prolia benefits- [phone]:  Plan follows Medicare guidelines and covers 80% of the injection and no authorization is required for [id number]. Patient will be required to pay the 20% coinsurance per each injection.  Ref# [id number] Phone# [phone]  Please send Prolia orders to [facility]

## 2020-03-18 NOTE — Telephone Encounter (Signed)
Patient advised orders have been placed for the Prolia. Patient provided phone number to schedule appointment.

## 2020-03-18 NOTE — Progress Notes (Signed)
Orders placed for Prolia 60 mg sq injection once every 6 months.    Last Visit: 08/20/19 Next Visit: 03/20/20 Labs: CBC and CMP updated on 03/16/20: CMP WNL Calcium 9.4 Vitamin D 92 on 10/16/19  She is taking vitamin D 5000 units by mouth twice daily.   Orders placed for Prolia x 1 dose.     Hazel Sams, PA-C

## 2020-03-18 NOTE — Telephone Encounter (Signed)
Called and discussed Prolia options with patient. Her grant expired in September a

## 2020-03-19 NOTE — Progress Notes (Signed)
Office Visit Note  Patient: [name] [name]             Date of Birth: February 09, 1936           MRN: [id number]             PCP: [name], MD Referring: [name], MD Visit Date: 03/20/2020 Occupation: @GUAROCC @  Subjective:  Pelvic pain   History of Present Illness: [name] [name] is a 84 y.o. female with history of osteoporosis and osteoarthritis.  She is planning on calling to schedule her next prolia injection today.  She continues to take vitamin D 5,000 units twice daily but is no longer taking a calcium supplement.  She reports in August she fell outside and landed on concrete and fractured her pelvis. She states the pain is a 7/10 when standing.  She has been using a cane or walker to assist with ambulation.  She has been seeing Dr. Lorin Mercy.  Her most recent appointment on 02/21/20 she had updated x-rays which revealed healing of the fracture.  She reports she was not a candidate for a kyphoplasty which she has had performed in the past for lumbar vertebral fractures.  She completed physical therapy 2 weeks ago for lower extremity strengthening and fall prevention.  She has continued to perform home exercises.  She denies any other falls or fractures recently. She denies any other joint pain or joint swelling at this time.  She denies any other new concerns.    Activities of Daily Living:  Patient reports morning stiffness for 0 minutes.   Patient Denies nocturnal pain.  Difficulty dressing/grooming: Denies Difficulty climbing stairs: Reports Difficulty getting out of chair: Reports Difficulty using hands for taps, buttons, cutlery, and/or writing: Denies  Review of Systems  Constitutional: Negative for fatigue.  HENT: Positive for mouth dryness. Negative for mouth sores and nose dryness.   Eyes: Negative for pain, itching, visual disturbance and dryness.  Respiratory: Negative for cough, hemoptysis, shortness of breath and difficulty breathing.   Cardiovascular:  Negative for chest pain, palpitations and swelling in legs/feet.  Gastrointestinal: Positive for constipation and diarrhea. Negative for abdominal pain and blood in stool.  Endocrine: Negative for increased urination.  Genitourinary: Negative for painful urination.  Musculoskeletal: Positive for arthralgias, joint pain, myalgias and myalgias. Negative for joint swelling, muscle weakness, morning stiffness and muscle tenderness.  Skin: Negative for color change, rash and redness.  Allergic/Immunologic: Negative for susceptible to infections.  Neurological: Negative for dizziness, numbness, headaches, memory loss and weakness.  Hematological: Negative for swollen glands.  Psychiatric/Behavioral: Positive for sleep disturbance. Negative for confusion.    PMFS History:  Patient Active Problem List   Diagnosis Date Noted  . Insufficiency fracture of pelvis 12/29/2019    Class: Acute  . Multiple closed pelvic fractures without disruption of pelvic circle (HCC)   . Impaired glucose tolerance   . History of fundoplication   . Pubic ramus fracture ([place]) 12/20/2019  . Senile purpura ([place]) 07/05/2019  . Atherosclerosis of autologous vein coronary artery bypass graft with angina pectoris ([place]) 07/05/2019  . Abnormal glucose 03/01/2018  . FHx: heart disease 03/01/2018  . CKD (chronic kidney disease) stage 2, GFR 60-89 ml/min 09/27/2017  . T12 compression fracture ([place]) 09/26/2017  . Chronic bronchitis ([place]) 08/09/2017  . Atherosclerosis of aorta ([place]) 04/05/2017  . Spinal stenosis, lumbar region, with neurogenic claudication 05/29/2015  . DDD (degenerative disc disease), lumbar 04/21/2015  . Hyperlipidemia, mixed 11/24/2014  . Gastroesophageal reflux disease 11/24/2014  .  Depression, major, in partial remission ([place]) 09/26/2014  . Chronic pain syndrome 09/26/2014  . Coronary artery disease involving native heart without angina pectoris   . Osteoporosis 02/10/2014  . Prediabetes 08/14/2013  .  Vitamin D deficiency 08/14/2013  . MGUS (monoclonal gammopathy of unknown significance) 03/05/2013  . Vertebral compression fracture ([place]) 06/23/2012  . ESOPHAGEAL STRICTURE 08/28/2008  . Mild intermittent asthma without complication 05/17/3233  . Incarcerated recurrent paraesophageal hiatal hernia s/p lap redo repair [id number] 01/31/2008  . Essential hypertension 07/25/2007    Past Medical History:  Diagnosis Date  . Anxiety    takes Xanax daily as needed  . Asthma    Albuterol daily as needed  . Blood transfusion without reported diagnosis   . CAD (coronary artery disease)    LHC 2003 with 40% pLAD, 30% mLAD, 100% D1 (moderate vessel), 100%  D2 (small vessel), 95% mid small OM2.  Pt had PTCA to D1;  D2 and OM2 small vessels and tx medically;  Last Myoview (2012): anterior infarct seen with scar, no ischemia. EF normal. Med Rx // Myoview (12/14):  Low risk; ant scar w/ peri-infarct ischemia; EF 55% with ant AK // Myoview 5/16: EF 55-65, ant, apical scar, no ischemia   . Cataract   . Chronic back pain    HNP, DDD, spinal stenosis, vertebral compression fractures.   . Chronic nausea    takes Zofran daily as needed.  normal gastric emptying study 03/2013  . CKD (chronic kidney disease), stage III ([place]) 09/27/2017  . Constipation    felt to be functional. takes Senokot and Miralax daily.  treated with Linzess in past.   . DIVERTICULOSIS, COLON 08/28/2008   Qualifier: Diagnosis of  By: [name], [name], [name]    . Elevated LFTs 2015   probably med induced DILI, from Augmentin vs Pravastatin  . GERD (gastroesophageal reflux disease)    hx of esophageal stricture   . Hiatal hernia    Paraesophageal hernias. s/p fundoplications in 5427 and 04/2013  . History of bronchitis several of yrs ago  . History of colon polyps 03/2009, 03/2011   adenomatous colon polyps, no high grade dysplasia.   [name] History of vertigo    no meds  . Hyperlipidemia    takes Pravastatin daily  . Hypertension    takes  Amlodipine,Metoprolol,and Losartan daily  . Nausea 03/2016  . Osteoporosis   . PONV (postoperative nausea and vomiting)    yrs ago  . Slow urinary stream    takes Rapaflo daily  . TIA (transient ischemic attack) 1995   no residual effects noted    Family History  Problem Relation Age of Onset  . Colon cancer Mother 32  . Heart attack Father 46       heart attack  . Brain cancer Brother        tumor   . Heart disease Brother   . Heart disease Brother   . Kidney disease Daughter   . Esophageal cancer Neg Hx   . Stomach cancer Neg Hx    Past Surgical History:  Procedure Laterality Date  . APPENDECTOMY     patient unsure of date  . BACK SURGERY    . BLADDER REPAIR    . CHOLECYSTECTOMY     Patient unsure of date.  . CORONARY ANGIOPLASTY  11/16/2001   1 stent  . ESOPHAGEAL MANOMETRY N/A 03/25/2013   Procedure: ESOPHAGEAL MANOMETRY (EM);  Surgeon: Inda Castle, MD;  Location: [facility] ENDOSCOPY;  Service: Endoscopy;  Laterality:  N/A;  . ESOPHAGOGASTRODUODENOSCOPY N/A 03/15/2013   Procedure: ESOPHAGOGASTRODUODENOSCOPY (EGD);  Surgeon: Jerene Bears, MD;  Location[facility] ENDOSCOPY;  Service: Endoscopy;  Laterality: N/A;  . ESOPHAGOGASTRODUODENOSCOPY N/A 12/25/2015   Procedure: ESOPHAGOGASTRODUODENOSCOPY (EGD);  Surgeon: Doran Stabler, MD;  Location: [facility] ENDOSCOPY;  Service: Endoscopy;  Laterality: N/A;  . EYE SURGERY  11/2000   bilateral cataracts with lens implant  . FIXATION KYPHOPLASTY LUMBAR SPINE  X 2   "L3-4"  . HEMORRHOID SURGERY    . HIATAL HERNIA REPAIR  06/03/2011   Procedure: LAPAROSCOPIC REPAIR OF HIATAL HERNIA;  Surgeon: Adin Hector, MD;  Location: [facility] ORS;  Service: General;  Laterality: N/A;  . HIATAL HERNIA REPAIR N/A 04/24/2013   Procedure: LAPAROSCOPIC  REPAIR RECURRENT PARASOPHAGEAL HIATAL HERNIA WITH FUNDOPLICATION;  Surgeon: Adin Hector, MD;  Location: [facility] ORS;  Service: General;  Laterality: N/A;  . INSERTION OF MESH N/A 04/24/2013   Procedure: INSERTION  OF MESH;  Surgeon: Adin Hector, MD;  Location: [facility] ORS;  Service: General;  Laterality: N/A;  . IR KYPHO THORACIC WITH BONE BIOPSY  09/29/2017  . IR KYPHO THORACIC WITH BONE BIOPSY  11/03/2017  . IR KYPHO THORACIC WITH BONE BIOPSY  02/14/2019  . IR KYPHO THORACIC WITH BONE BIOPSY  04/02/2019  . LAPAROSCOPIC LYSIS OF ADHESIONS N/A 04/24/2013   Procedure: LAPAROSCOPIC LYSIS OF ADHESIONS;  Surgeon: Adin Hector, MD;  Location: [facility] ORS;  Service: General;  Laterality: N/A;  . LAPAROSCOPIC NISSEN FUNDOPLICATION  7/49/4496   Procedure: LAPAROSCOPIC NISSEN FUNDOPLICATION;  Surgeon: Adin Hector, MD;  Location: [facility] ORS;  Service: General;  Laterality: N/A;  . LUMBAR FUSION  12/07/2015   Right L2-3 facetectomy, posterolateral fusion L2-3 with pedicle screws, rods, local bone graft, Vivigen cancellous chips. Fusion extended to the L1 level with pedicle screws and rods  . LUMBAR LAMINECTOMY/DECOMPRESSION MICRODISCECTOMY Right 06/26/2012   Procedure: LUMBAR LAMINECTOMY/DECOMPRESSION MICRODISCECTOMY;  Surgeon: Jessy Oto, MD;  Location: [place];  Service: Orthopedics;  Laterality: Right;  RIGHT L4-5 MICRODISCECTOMY  . LUMBAR LAMINECTOMY/DECOMPRESSION MICRODISCECTOMY N/A 05/29/2015   Procedure: RIGHT L2-3 MICRODISCECTOMY;  Surgeon: Jessy Oto, MD;  Location: [place];  Service: Orthopedics;  Laterality: N/A;  . TOTAL ABDOMINAL HYSTERECTOMY  1975   partial  . UPPER GASTROINTESTINAL ENDOSCOPY     Social History   Social History Narrative   Lives with husband.   Right-handed.   3 cups caffeine per day.   Immunization History  Administered Date(s) Administered  . DT (Pediatric) 05/06/2014  . Influenza Split 02/07/2011  . Influenza Whole 02/09/2009, 01/19/2010, 02/13/2012  . Influenza, High Dose Seasonal PF 03/22/2013, 01/15/2014, 02/27/2015, 01/14/2016, 01/18/2017, 02/14/2018, 02/14/2019, 03/16/2020  . PFIZER SARS-COV-2 Vaccination 05/25/2019, 06/15/2019  . Pneumococcal Conjugate-13 05/06/2014  .  Pneumococcal Polysaccharide-23 02/09/2011  . Pneumococcal-Unspecified 02/06/2010, 02/09/2012  . Td 05/10/2003     Objective: Vital Signs: BP 133/78 (BP Location: Left Arm, Patient Position: Sitting, Cuff Size: Small)   Pulse 79   Ht 5\' 4"  (1.626 m)   Wt 154 lb (69.9 kg)   BMI 26.43 kg/m    Physical Exam Vitals and nursing note reviewed.  Constitutional:      Appearance: She is well-developed.  HENT:     Head: Normocephalic and atraumatic.  Eyes:     Conjunctiva/sclera: Conjunctivae normal.  Pulmonary:     Effort: Pulmonary effort is normal.  Abdominal:     Palpations: Abdomen is soft.  Musculoskeletal:     Cervical back: Normal range of  motion.  Skin:    General: Skin is warm and dry.     Capillary Refill: Capillary refill takes less than 2 seconds.  Neurological:     Mental Status: She is alert and oriented to person, place, and time.  Psychiatric:        Behavior: Behavior normal.      Musculoskeletal Exam: C-spine good ROM.  Thoracic kyphosis.  Midline spinal tenderness in the lumbar region.  Shoulder joints, elbow joints, wrist joints, MCPs, PIPs, and DIPs good ROM with no synovitis. PIP and DIP thickening consistent with osteoarthritis of both hands.  Complete fist formation bilaterally.  Hip joints, knee joints and ankle joints good ROM with no discomfort.  No warmth or effusion of knee joints.  No tenderness or swelling of ankle joints.   CDAI Exam: CDAI Score: -- Patient Global: --; Provider Global: -- Swollen: --; Tender: -- Joint Exam 03/20/2020   No joint exam has been documented for this visit   There is currently no information documented on the homunculus. Go to the Rheumatology activity and complete the homunculus joint exam.  Investigation: No additional findings.  Imaging: XR Knee 1-2 Views Right  Result Date: 02/21/2020 Standing AP both knees lateral right knee obtained and reviewed.  This shows trace chondrocalcinosis of the meniscus medial  and lateral on the right knee and medial on the left knee.  No acute bony changes.  No significant joint space narrowing. Impression: Right knee x-rays negative for acute bone abnormality.  Slight meniscal chondrocalcinosis.  XR Pelvis 1-2 Views  Result Date: 02/21/2020 Pelvis x-ray obtained and reviewed this shows rami fractures right and left superior and inferior rami with sclerotic interval healing.  Comparison to 01/22/2020 images. Impression: Healing pelvic rami fractures.  Lumbar vertebroplasty noted   Recent Labs: Lab Results  Component Value Date   WBC 7.1 03/16/2020   HGB 14.8 03/16/2020   PLT 408 (H) 03/16/2020   NA 139 03/16/2020   K 4.5 03/16/2020   CL 104 03/16/2020   CO2 25 03/16/2020   GLUCOSE 89 03/16/2020   BUN 21 03/16/2020   CREATININE 0.86 03/16/2020   BILITOT 0.4 03/16/2020   ALKPHOS 185 (H) 01/29/2020   AST 16 03/16/2020   ALT 14 03/16/2020   PROT 6.7 03/16/2020   ALBUMIN 3.9 01/29/2020   CALCIUM 9.4 03/16/2020   GFRAA 73 03/16/2020    Speciality Comments: No specialty comments available.  Procedures:  No procedures performed Allergies: Ace inhibitors, Aspartame, Atorvastatin, Biaxin [clarithromycin], Daliresp [roflumilast], Erythromycin, and Levofloxacin   Assessment / Plan:     Visit Diagnoses: Age-related osteoporosis without current pathological fracture - DEXA 11/12/18: The BMD measured at Femur Neck Left is 0.657 g/cm2 with a T-score of -2.7. Due for updated DEXA in July 2022.  She has a history of vertebral fractures/several kyphoplasty.  Most recently she fell in August 2021 and fractured the ramus of right pubis.  She has been seeing Dr. Lorin Mercy.  Most recent x-ray updated on 02/21/20 revealed healing of pelvic rami fractures. Her discomfort while sitting/laying has resolved but she rates her pain a 7/10 when standing/walking.  She was encouraged to continue to walk with a cane or walker to assist with ambulation.  She completed physical therapy 2  weeks ago for lower extremity muscle strengthening and fall prevention.  She plans to continue home exercises.   Her last prolia injection was on 09/12/19.  Orders for her next Prolia injection placed on 03/18/20.  She was advised to call  [facility] medical day to schedule the injection.  Lab work from 03/16/20 were reviewed.  She was encouraged to continue to take vitamin D 5,000 units twice daily and to obtain calcium from her diet as recommended by her PCP.  She will follow up in 6 months.  Encounter for medication monitoring - Last prolia injection: 09/12/2019. Next orders for prolia were placed on 03/18/20.  She will call today to schedule her appointment at [facility] medical day. Lab work from 03/16/20 were reviewed today in the office and prior to placing the prolia orders.    History of vitamin D deficiency: She is taking vitamin D 5,000 units twice daily. Vitamin D was 92 on 10/16/19.    History of vertebral compression fracture: h/o kyphoplasty.  She has some midline spinal tenderness in the lumbar region.  She has chronic pain but no new or worsening symptoms recently.   H/O kyphoplasty: Performed by Dr. Estanislado Pandy in the past.   Multiple closed fractures of pelvis without disruption of pelvic ring, sequela: Followed by Dr. Lorin Mercy.  Updated x-rays on 02/21/20 revealed routine healing.  Pain is slowly improving.  She completed physical therapy 2 weeks ago which was helpful.  She was strongly encouraged to continue to use a cane or preferably a walker to assist with ambulation.   Primary osteoarthritis of both knees: She has good ROM of both knee joints with on discomfort.  No warmth or effusion of knee joints noted.    DDD (degenerative disc disease), lumbar: Chronic pain  Facet arthropathy  Other medical conditions are listed as follows:   History of hypercholesterolemia  History of gastroesophageal reflux (GERD)  History of appendectomy  History of asthma  History of anxiety  History of  cholecystectomy  History of neuropathy  History of hypertension  Orders: No orders of the defined types were placed in this encounter.  No orders of the defined types were placed in this encounter.    Follow-Up Instructions: Return in about 6 months (around 09/17/2020) for Osteoporosis, DDD.   Ofilia Neas, PA-C  Note - This record has been created using Dragon software.  Chart creation errors have been sought, but may not always  have been located. Such creation errors do not reflect on  the standard of medical care.

## 2020-03-20 ENCOUNTER — Encounter: Payer: Self-pay | Admitting: Physician Assistant

## 2020-03-20 ENCOUNTER — Other Ambulatory Visit: Payer: Self-pay

## 2020-03-20 ENCOUNTER — Ambulatory Visit (INDEPENDENT_AMBULATORY_CARE_PROVIDER_SITE_OTHER): Payer: PPO | Admitting: Physician Assistant

## 2020-03-20 VITALS — BP 133/78 | HR 79 | Ht 64.0 in | Wt 154.0 lb

## 2020-03-20 DIAGNOSIS — Z8719 Personal history of other diseases of the digestive system: Secondary | ICD-10-CM | POA: Diagnosis not present

## 2020-03-20 DIAGNOSIS — Z8669 Personal history of other diseases of the nervous system and sense organs: Secondary | ICD-10-CM

## 2020-03-20 DIAGNOSIS — Z8679 Personal history of other diseases of the circulatory system: Secondary | ICD-10-CM

## 2020-03-20 DIAGNOSIS — Z8709 Personal history of other diseases of the respiratory system: Secondary | ICD-10-CM

## 2020-03-20 DIAGNOSIS — Z8639 Personal history of other endocrine, nutritional and metabolic disease: Secondary | ICD-10-CM | POA: Diagnosis not present

## 2020-03-20 DIAGNOSIS — Z9049 Acquired absence of other specified parts of digestive tract: Secondary | ICD-10-CM

## 2020-03-20 DIAGNOSIS — S3282XS Multiple fractures of pelvis without disruption of pelvic ring, sequela: Secondary | ICD-10-CM | POA: Diagnosis not present

## 2020-03-20 DIAGNOSIS — M5136 Other intervertebral disc degeneration, lumbar region: Secondary | ICD-10-CM

## 2020-03-20 DIAGNOSIS — Z5181 Encounter for therapeutic drug level monitoring: Secondary | ICD-10-CM

## 2020-03-20 DIAGNOSIS — M170 Bilateral primary osteoarthritis of knee: Secondary | ICD-10-CM

## 2020-03-20 DIAGNOSIS — Z8659 Personal history of other mental and behavioral disorders: Secondary | ICD-10-CM

## 2020-03-20 DIAGNOSIS — M810 Age-related osteoporosis without current pathological fracture: Secondary | ICD-10-CM

## 2020-03-20 DIAGNOSIS — Z8781 Personal history of (healed) traumatic fracture: Secondary | ICD-10-CM | POA: Diagnosis not present

## 2020-03-20 DIAGNOSIS — M47819 Spondylosis without myelopathy or radiculopathy, site unspecified: Secondary | ICD-10-CM

## 2020-03-20 DIAGNOSIS — Z9889 Other specified postprocedural states: Secondary | ICD-10-CM

## 2020-03-24 ENCOUNTER — Other Ambulatory Visit: Payer: Self-pay | Admitting: Internal Medicine

## 2020-03-24 DIAGNOSIS — R112 Nausea with vomiting, unspecified: Secondary | ICD-10-CM

## 2020-04-07 ENCOUNTER — Ambulatory Visit (HOSPITAL_COMMUNITY)
Admission: RE | Admit: 2020-04-07 | Discharge: 2020-04-07 | Disposition: A | Discharge status: Home / Self Care | Payer: PPO | Source: Ambulatory Visit | Attending: Rheumatology | Admitting: Rheumatology

## 2020-04-07 ENCOUNTER — Other Ambulatory Visit: Payer: Self-pay | Admitting: Adult Health

## 2020-04-07 ENCOUNTER — Other Ambulatory Visit: Payer: Self-pay

## 2020-04-07 DIAGNOSIS — K219 Gastro-esophageal reflux disease without esophagitis: Secondary | ICD-10-CM

## 2020-04-07 DIAGNOSIS — Z79899 Other long term (current) drug therapy: Secondary | ICD-10-CM

## 2020-04-07 DIAGNOSIS — M810 Age-related osteoporosis without current pathological fracture: Secondary | ICD-10-CM | POA: Diagnosis not present

## 2020-04-07 MED ORDER — DENOSUMAB 60 MG/ML ~~LOC~~ SOSY
60.0000 mg | PREFILLED_SYRINGE | Freq: Once | SUBCUTANEOUS | Status: AC
  Administered 2020-04-07: 60 mg via SUBCUTANEOUS

## 2020-04-07 MED ORDER — DENOSUMAB 60 MG/ML ~~LOC~~ SOSY
PREFILLED_SYRINGE | SUBCUTANEOUS | Status: AC
  Filled 2020-04-07: qty 1

## 2020-04-07 NOTE — Discharge Instructions (Signed)
Denosumab injection What is this medicine? DENOSUMAB (den oh sue mab) slows bone breakdown. Prolia is used to treat osteoporosis in women after menopause and in men, and in people who are taking corticosteroids for 6 months or more. Xgeva is used to treat a high calcium level due to cancer and to prevent bone fractures and other bone problems caused by multiple myeloma or cancer bone metastases. Xgeva is also used to treat giant cell tumor of the bone. This medicine may be used for other purposes; ask your health care provider or pharmacist if you have questions. COMMON BRAND NAME(S): Prolia, XGEVA What should I tell my health care provider before I take this medicine? They need to know if you have any of these conditions:  dental disease  having surgery or tooth extraction  infection  kidney disease  low levels of calcium or Vitamin D in the blood  malnutrition  on hemodialysis  skin conditions or sensitivity  thyroid or parathyroid disease  an unusual reaction to denosumab, other medicines, foods, dyes, or preservatives  pregnant or trying to get pregnant  breast-feeding How should I use this medicine? This medicine is for injection under the skin. It is given by a health care professional in a hospital or clinic setting. A special MedGuide will be given to you before each treatment. Be sure to read this information carefully each time. For Prolia, talk to your pediatrician regarding the use of this medicine in children. Special care may be needed. For Xgeva, talk to your pediatrician regarding the use of this medicine in children. While this drug may be prescribed for children as young as 13 years for selected conditions, precautions do apply. Overdosage: If you think you have taken too much of this medicine contact a poison control center or emergency room at once. NOTE: This medicine is only for you. Do not share this medicine with others. What if I miss a dose? It is  important not to miss your dose. Call your doctor or health care professional if you are unable to keep an appointment. What may interact with this medicine? Do not take this medicine with any of the following medications:  other medicines containing denosumab This medicine may also interact with the following medications:  medicines that lower your chance of fighting infection  steroid medicines like prednisone or cortisone This list may not describe all possible interactions. Give your health care provider a list of all the medicines, herbs, non-prescription drugs, or dietary supplements you use. Also tell them if you smoke, drink alcohol, or use illegal drugs. Some items may interact with your medicine. What should I watch for while using this medicine? Visit your doctor or health care professional for regular checks on your progress. Your doctor or health care professional may order blood tests and other tests to see how you are doing. Call your doctor or health care professional for advice if you get a fever, chills or sore throat, or other symptoms of a cold or flu. Do not treat yourself. This drug may decrease your body's ability to fight infection. Try to avoid being around people who are sick. You should make sure you get enough calcium and vitamin D while you are taking this medicine, unless your doctor tells you not to. Discuss the foods you eat and the vitamins you take with your health care professional. See your dentist regularly. Brush and floss your teeth as directed. Before you have any dental work done, tell your dentist you are   receiving this medicine. Do not become pregnant while taking this medicine or for 5 months after stopping it. Talk with your doctor or health care professional about your birth control options while taking this medicine. Women should inform their doctor if they wish to become pregnant or think they might be pregnant. There is a potential for serious side  effects to an unborn child. Talk to your health care professional or pharmacist for more information. What side effects may I notice from receiving this medicine? Side effects that you should report to your doctor or health care professional as soon as possible:  allergic reactions like skin rash, itching or hives, swelling of the face, lips, or tongue  bone pain  breathing problems  dizziness  jaw pain, especially after dental work  redness, blistering, peeling of the skin  signs and symptoms of infection like fever or chills; cough; sore throat; pain or trouble passing urine  signs of low calcium like fast heartbeat, muscle cramps or muscle pain; pain, tingling, numbness in the hands or feet; seizures  unusual bleeding or bruising  unusually weak or tired Side effects that usually do not require medical attention (report to your doctor or health care professional if they continue or are bothersome):  constipation  diarrhea  headache  joint pain  loss of appetite  muscle pain  runny nose  tiredness  upset stomach This list may not describe all possible side effects. Call your doctor for medical advice about side effects. You may report side effects to FDA at 1-800-FDA-1088. Where should I keep my medicine? This medicine is only given in a clinic, doctor's office, or other health care setting and will not be stored at home. NOTE: This sheet is a summary. It may not cover all possible information. If you have questions about this medicine, talk to your doctor, pharmacist, or health care provider.  2020 Elsevier/Gold Standard (2017-09-01 16:10:44)

## 2020-04-20 ENCOUNTER — Other Ambulatory Visit: Payer: Self-pay

## 2020-04-20 ENCOUNTER — Ambulatory Visit (INDEPENDENT_AMBULATORY_CARE_PROVIDER_SITE_OTHER): Payer: PPO | Admitting: Adult Health

## 2020-04-20 ENCOUNTER — Ambulatory Visit
Admission: RE | Admit: 2020-04-20 | Discharge: 2020-04-20 | Disposition: A | Discharge status: Home / Self Care | Payer: PPO | Source: Ambulatory Visit | Attending: Adult Health | Admitting: Adult Health

## 2020-04-20 ENCOUNTER — Encounter: Payer: Self-pay | Admitting: Adult Health

## 2020-04-20 VITALS — BP 126/68 | HR 86 | Temp 97.5°F | Wt 153.0 lb

## 2020-04-20 DIAGNOSIS — R7302 Impaired glucose tolerance (oral): Secondary | ICD-10-CM | POA: Diagnosis not present

## 2020-04-20 DIAGNOSIS — R058 Other specified cough: Secondary | ICD-10-CM | POA: Diagnosis not present

## 2020-04-20 DIAGNOSIS — E782 Mixed hyperlipidemia: Secondary | ICD-10-CM

## 2020-04-20 DIAGNOSIS — I700 Atherosclerosis of aorta: Secondary | ICD-10-CM | POA: Diagnosis not present

## 2020-04-20 DIAGNOSIS — R0989 Other specified symptoms and signs involving the circulatory and respiratory systems: Secondary | ICD-10-CM | POA: Diagnosis not present

## 2020-04-20 DIAGNOSIS — R059 Cough, unspecified: Secondary | ICD-10-CM | POA: Diagnosis not present

## 2020-04-20 DIAGNOSIS — I10 Essential (primary) hypertension: Secondary | ICD-10-CM

## 2020-04-20 DIAGNOSIS — I2510 Atherosclerotic heart disease of native coronary artery without angina pectoris: Secondary | ICD-10-CM | POA: Diagnosis not present

## 2020-04-20 MED ORDER — PREDNISONE 20 MG PO TABS: ORAL_TABLET | ORAL | 0 refills | Status: DC

## 2020-04-20 MED ORDER — FLUTICASONE-SALMETEROL 250-50 MCG/DOSE IN AEPB: 1.0000 | INHALATION_SPRAY | Freq: Two times a day (BID) | RESPIRATORY_TRACT | 3 refills | Status: DC

## 2020-04-20 MED ORDER — IPRATROPIUM-ALBUTEROL 0.5-2.5 (3) MG/3ML IN SOLN: 3.0000 mL | Freq: Four times a day (QID) | RESPIRATORY_TRACT | 0 refills | Status: DC | PRN

## 2020-04-20 MED ORDER — PROMETHAZINE-DM 6.25-15 MG/5ML PO SYRP: 5.0000 mL | ORAL_SOLUTION | Freq: Four times a day (QID) | ORAL | 1 refills | Status: DC | PRN

## 2020-04-20 NOTE — Patient Instructions (Signed)
Please take 1 puff of of BREZTRI OR ADVAIR twice daily  Rinse mouth afterwards  Please use neb medication every 6 hours as needed for wheezing - may take in the evening prior to bedtime  Please get chest xray at [address] imaging center      Budesonide; Glycopyrrolate; Formoterol inhalation aerosol What is this medicine? BUDESONIDE; GLYCOPYRROLATE; FORMOTEROL (byoo DES oh nide; glye koe PYE roe late; for [facility] te rol) inhalation is a combination of 3 medicines. Budesonide decreases inflammation in the lungs. Formoterol and glycopyrrolate are bronchodilators that help keep airways open. This medicine is used to treat chronic obstructive pulmonary disease (COPD). Do not use this drug combination for acute COPD attacks or bronchospasm. This medicine may be used for other purposes; ask your health care provider or pharmacist if you have questions. COMMON BRAND NAME(S): BREZTRI What should I tell my health care provider before I take this medicine? They need to know if you have any of these conditions:  bladder problems or trouble passing urine  bone problems  diabetes  eye disease, vision problems  heart disease  high blood pressure  history of irregular heartbeat  immune system problems  infection  kidney disease  pheochromocytoma  prostate disease  seizures  thyroid disease  an unusual or allergic reaction to budesonide, formoterol, glycopyrrolate, medicines, foods, dyes, or preservatives  pregnant or trying to get pregnant  breast-feeding How should I use this medicine? This medicine is inhaled through the mouth. Follow the directions on the prescription label. Shake well before each use. Rinse your mouth with water after use. Make sure not to swallow the water. Take your medicine at regular intervals. Do not take your medicine more often than directed. Do not stop taking except on your doctor's advice. Make sure that you are using your inhaler  correctly. This drug comes with INSTRUCTIONS FOR USE. Ask your pharmacist for directions on how to use this drug. Read the information carefully. Talk to your pharmacist or health care provider if you have questions. Talk to your pediatrician regarding the use of this medicine in children. This medicine is not approved for use in children. Overdosage: If you think you have taken too much of this medicine contact a poison control center or emergency room at once. NOTE: This medicine is only for you. Do not share this medicine with others. What if I miss a dose? If you miss a dose, use it as soon as you can. If it is almost time for your next dose, use only that dose. Do not use double or extra doses. What may interact with this medicine? Do not take the medicine with any of the following medications:  cisapride  dofetilide  dronedarone  MAOIs like Marplan, Nardil, and Parnate  other medicines that contain long-acting beta-2 agonists (LABAs) like arformoterol, formoterol, indacaterol, olodaterol, salmeterol, vilanterol  pimozide  procarbazine  thioridazine This medicine may also interact with the following medications:  antihistamines for allergy, cough, and cold  atropine  certain antibiotics like clarithromycin, telithromycin  certain antivirals for HIV or hepatitis  certain heart medicines like atenolol, metoprolol  certain medicines for bladder problems like oxybutynin, tolterodine  certain medicines for blood pressure, heart disease, irregular heartbeat  certain medicines for depression, anxiety, or psychotic disturbances  certain medicines for fungal infections like ketoconazole, itraconazole  certain medicines for Parkinson's disease like benztropine, trihexyphenidyl  certain medicines for stomach problems like dicyclomine, hyoscyamine  certain medicines for travel sickness like scopolamine  diuretics  grapefruit juice  mifepristone  other inhaled  medicines that contain anticholinergics such as aclidinium, ipratropium, tiotropium, umeclidinium  other medicines that prolong the QT interval (an abnormal heart rhythm)  some vaccines  steroid medicines like prednisone or cortisone  stimulant medicines for attention disorders, weight loss, or to stay awake  theophylline This list may not describe all possible interactions. Give your health care provider a list of all the medicines, herbs, non-prescription drugs, or dietary supplements you use. Also tell them if you smoke, drink alcohol, or use illegal drugs. Some items may interact with your medicine. What should I watch for while using this medicine? Visit your health care professional for regular checks on your progress. Tell your health care professional if your symptoms do not start to get better or if they get worse. NEVER use this medicine for an acute asthma or COPD attack. You should use your short-acting rescue inhalers for this purpose. If your symptoms get worse or if you need your short-acting inhalers more often, call your doctor right away. This medicine may increase your risk of getting an infection. Tell your doctor or health care professional if you are around anyone with measles or chickenpox, or if you develop sores or blisters that do not heal properly. Do not get this medicine in your eyes. It can cause irritation, pain, or blurred vision. What side effects may I notice from receiving this medicine? Side effects that you should report to your doctor or health care professional as soon as possible:  allergic reactions like skin rash, itching or hives, swelling of the face, lips, or tongue  anxious  breathing problems  changes in vision, eye pain  muscle cramps or muscle pain  signs and symptoms of a dangerous change in heartbeat or heart rhythm like chest pain; dizziness; fast or irregular heartbeat; palpitations; feeling faint or lightheaded, falls; breathing  problems  signs and symptoms of high blood sugar such as being more thirsty or hungry or having to urinate more than normal. You may also feel very tired or have blurry vision  signs and symptoms of infection like fever; chills; cough; sore throat; pain or trouble passing urine  tremors  trouble passing urine or change in the amount of urine  unusually weak or tired  white patches in the mouth or mouth sores Side effects that usually do not require medical attention (report these to your doctor or health care professional if they continue or are bothersome):  back pain  changes in taste  cough  diarrhea  runny or stuffy nose  upset stomach This list may not describe all possible side effects. Call your doctor for medical advice about side effects. You may report side effects to FDA at 1-800-FDA-1088. Where should I keep my medicine? Keep out of the reach of children. Store in a dry place at room temperature between 15 and 30 degrees C (59 and 86 degrees F). Protect from heat. Do not freeze. Do not use or store near heat or flame, as the canister may burst. Throw away the inhaler 3 months after you open the foil pouch (for the 120-inhalation canister), or 3 weeks after you open the foil pouch (for the 28-inhalation canister), or when the dose indicator reaches zero "0", whichever comes first. NOTE: This sheet is a summary. It may not cover all possible information. If you have questions about this medicine, talk to your doctor, pharmacist, or health care provider.  2020 Elsevier/Gold Standard (2018-12-04 18:58:00) Acute Bronchitis, Adult  Acute bronchitis is sudden or acute swelling of the air tubes (bronchi) in the lungs. Acute bronchitis causes these tubes to fill with mucus, which can make it hard to breathe. It can also cause coughing or wheezing. In adults, acute bronchitis usually goes away within 2 weeks. A cough caused by bronchitis may last up to 3 weeks. Smoking,  allergies, and asthma can make the condition worse. What are the causes? This condition can be caused by germs and by substances that irritate the lungs, including:  Cold and flu viruses. The most common cause of this condition is the virus that causes the common cold.  Bacteria.  Substances that irritate the lungs, including: ? Smoke from cigarettes and other forms of tobacco. ? Dust and pollen. ? Fumes from chemical products, gases, or burned fuel. ? Other materials that pollute indoor or outdoor air.  Close contact with someone who has acute bronchitis. What increases the risk? The following factors may make you more likely to develop this condition:  A weak body's defense system, also called the immune system.  A condition that affects your lungs and breathing, such as asthma. What are the signs or symptoms? Common symptoms of this condition include:  Lung and breathing problems, such as: ? Coughing. This may bring up clear, yellow, or green mucus from your lungs (sputum). ? Wheezing. ? Having too much mucus in your lungs (chest congestion). ? Having shortness of breath.  A fever.  Chills.  Aches and pains, including: ? Tightness in your chest and other body aches. ? A sore throat. How is this diagnosed? This condition is usually diagnosed based on:  Your symptoms and medical history.  A physical exam. You may also have other tests, including tests to rule out other conditions, such pneumonia. These tests include:  A test of lung function.  Test of a mucus sample to look for the presence of bacteria.  Tests to check the oxygen level in your blood.  Blood tests.  Chest X-ray. How is this treated? Most cases of acute bronchitis clear up over time without treatment. Your health care provider may recommend:  Drinking more fluids. This can thin your mucus, which may improve your breathing.  Taking a medicine for a fever or cough.  Using a device that gets  medicine into your lungs (inhaler) to help improve breathing and control coughing.  Using a vaporizer or a humidifier. These are machines that add water to the air to help you breathe better. Follow these instructions at home: Activity  Get plenty of rest.  Return to your normal activities as told by your health care provider. Ask your health care provider what activities are safe for you. Lifestyle  Drink enough fluid to keep your urine pale yellow.  Do not drink alcohol.  Do not use any products that contain nicotine or tobacco, such as cigarettes, e-cigarettes, and chewing tobacco. If you need help quitting, ask your health care provider. Be aware that: ? Your bronchitis will get worse if you smoke or breathe in other people's smoke (secondhand smoke). ? Your lungs will heal faster if you quit smoking. General instructions   Take over-the-counter and prescription medicines only as told by your health care provider.  Use an inhaler, vaporizer, or humidifier as told by your health care provider.  If you have a sore throat, gargle with a salt-water mixture 3-4 times a day or as needed. To make a salt-water mixture, completely dissolve -1 tsp (3-6 g) of salt  in 1 cup (237 mL) of warm water.  Keep all follow-up visits as told by your health care provider. This is important. How is this prevented? To lower your risk of getting this condition again:  Wash your hands often with soap and water. If soap and water are not available, use hand sanitizer.  Avoid contact with people who have cold symptoms.  Try not to touch your mouth, nose, or eyes with your hands.  Avoid places where there are fumes from chemicals. Breathing these fumes will make your condition worse.  Get the flu shot every year. Contact a health care provider if:  Your symptoms do not improve after 2 weeks of treatment.  You vomit more than once or twice.  You have symptoms of dehydration such as: ? Dark  urine. ? Dry skin or eyes. ? Increased thirst. ? Headaches. ? Confusion. ? Muscle cramps. Get help right away if you:  Cough up blood.  Feel pain in your chest.  Have severe shortness of breath.  Faint or keep feeling like you are going to faint.  Have a severe headache.  Have fever or chills that get worse. These symptoms may represent a serious problem that is an emergency. Do not wait to see if the symptoms will go away. Get medical help right away. Call your local emergency services (911 in the [place] Do not drive yourself to the hospital. Summary  Acute bronchitis is sudden (acute) inflammation of the air tubes (bronchi) between the windpipe and the lungs. In adults, acute bronchitis usually goes away within 2 weeks, although coughing may last 3 weeks or longer  Take over-the-counter and prescription medicines only as told by your health care provider.  Drink enough fluid to keep your urine pale yellow.  Contact a health care provider if your symptoms do not improve after 2 weeks of treatment.  Get help right away if you cough up blood, faint, or have chest pain or shortness of breath. This information is not intended to replace advice given to you by your health care provider. Make sure you discuss any questions you have with your health care provider. Document Revised: 01/07/2019 Document Reviewed: 11/16/2018 Elsevier Patient Education  [place].

## 2020-04-20 NOTE — Progress Notes (Signed)
3 MONTH FOLLOW UP  Assessment:    Essential hypertension - continue medications, DASH diet, exercise and monitor at home. Call if greater than 130/80.  - CBC with Differential/Platelet - CMP/GFR - TSH  Prediabetes Discussed general issues about diabetes pathophysiology and management., Educational material distributed., Suggested low cholesterol diet., Encouraged aerobic exercise., Discussed foot care., Reminded to get yearly retinal exam. - Hemoglobin A1c q67m   Mixed hyperlipidemia -? Off of statin due to elevated LFTs check lipids, decrease fatty foods, increase activity.  - Lipid panel  Coronary artery disease due to lipid rich plaque Control blood pressure, cholesterol, glucose, increase exercise.  - Lipid panel - Hemoglobin A1c  Recurrent major depressive disorder, in partial remission (HCC) Continue medications  Lifestyle discussed: diet/exerise, sleep hygiene, stress management, hydration  Atherosclerosis of aorta ([place]) Per numerous CTs Control blood pressure, cholesterol, glucose, increase exercise.    Asthma, unspecified asthma severity, uncomplicated Restart inhalers Go to the ER if any chest pain, shortness of breath, nausea, dizziness, severe HA, changes vision/speech  Chronic bronchitis (HCC) Continue inhalers, follows with pulm  Vertebral compression fracture, sequela Continue fosamax, continue Vit D and Ca, weight bearing exercises  Osteoporosis Continue fosamax, continue Vit D and Ca, weight bearing exercises   Herniated lumbar intervertebral disc Continue pain management  Vitamin D deficiency Continue supplement   MGUS (monoclonal gammopathy of unknown significance) - CBC with Differential/Platelet  Medication management - Magnesium  Chronic pain syndrome Continue pain management   Anxiety continue medications, stress management techniques discussed, increase water, good sleep hygiene discussed, increase exercise, and increase veggies.     Gastroesophageal reflux disease, esophagitis presence not specified Continue PPI/H2 blocker, diet discussed   ESOPHAGEAL STRICTURE Improved on last, follow up GI as recommended; continue PPI   Constipation, chronic Continue OTC agents, GI following  Spondylolisthesis, lumbar region Followed by spine specialist and pain management.   DDD (degenerative disc disease), lumbar Continue follow up pain management  Spinal stenosis, lumbar region, with neurogenic claudication Continue follow up pain management  History of lumbar spinal fusion Continue follow up pain management  Productive cough/ wheezing/abnormal lung sounds Get CXR to r/o pneumonia due to fatigue;  Suspect bronchitis flare r/t non-adherence with medication  Refilled advair; given breztri samples, emphasized BID dosing,  Sent in steroid taper, promethazine DM abx pending CXR results this afternoon if indicated   Over 30 minutes of exam, counseling, chart review, and critical decision making was performed  Future Appointments  Date Time Provider [place]  07/22/2020 10:30 AM [name], NP [facility] None  09/17/2020 10:15 AM [name], MD [facility] None  10/15/2020 10:00 AM [name], NP [facility] None     Plan:   During the course of the visit the patient was educated and counseled about appropriate screening and preventive services including:    Pneumococcal vaccine   Influenza vaccine  Td vaccine  Prevnar 13  Screening electrocardiogram  Screening mammography  Bone densitometry screening  Colorectal cancer screening  Diabetes screening  Glaucoma screening  Nutrition counseling   Advanced directives: given info/requested copies   Subjective:   [name] [name] is a 84 y.o. female who presents for 3 month follow up on hypertension, prediabetes, hyperlipidemia, vitamin D def.   Hx of mild intermittent asthma and chronic bronchitis, follows with Dr. Melvyn Novas.  Prescribed advair but reports has been taking 1 puff of breztri (sample), only taking daily in recent weeks, was taking intermittently for a while. She had 3/3 covid 19 vaccines.  She reports started feeling "lousy" last week 7-8 days ago, started with intermittently productive cough (thick yellow, alternating with clear), some wheezing (late night and early morning) and chest congestion.   Denies CP, dyspnea, fever/chills, sinus pressure, congestion, sore throat, HA, rash.   She denies allergies other than to dogs/cats. She has been taking breztri from sample that was given last visit. Covid 19 vaccinated. No known sick contacts. Husband is with her, hasn't had sx.   Patient has a chronic pain syndrome and is followed by Dr Nicholaus Bloom (pain management) and Dr Louanne Skye.  Hx of numerous lumbar surgeries, disc and fusions historically, most recently T8, T9 and T11 and T12 vertebral augmentation due to compression fractures. Recently hospitalized 08/13-24/2021 with pelvic acetabular Fx and is receiving non weightbearing home LPT under Dr.s Louanne Skye & Lorin Mercy  ecently saw Dr. Havery Moros for abdominal pain, negative workup, suspect r/t chronic msk etiology and advised to follow up with pain management. She is eating prunes and taking miralax for constipation, off of linzess and doing well.   Patient has hx/o Osteoporosis with T-2.6 at last DexaBMD on 11/2018 (improved from -2.8 in 2018) and is on Fosamax, followed by Dr. Estanislado Pandy.     She has history of MGUS with negative BX.   she has a diagnosis of anxiety and is currently on xanax 0.25-0.5 mg TID PRN, reports symptoms are well controlled on current regimen. she reports she last took 2-3 weeks ago, needs sporadically.   BMI is Body mass index is 26.26 kg/m., she has not been working on diet and exercise. Her weight is up, no edema. She admits to eating more.  Wt Readings from Last 3 Encounters:  04/20/20 153 lb (69.4 kg)  03/20/20 154 lb (69.9 kg)   02/21/20 153 lb (69.4 kg)   She also has history of PTCA/stent placed in 2003 and low risk myoview in 2014.  She has aortic atherosclerosis per numerous CTs, e.g 04/2019.  Her blood pressure has been controlled at home, today their BP is BP: 126/68  She does not workout due to chronic pain.   She is not on cholesterol medication (formerly on pravastatin 40 mg daily, reports was discontinued by another provider - ? GI due to elevated LFTs) and denies myalgias. Her cholesterol is at goal. The cholesterol last visit was:   Lab Results  Component Value Date   CHOL 163 10/16/2019   HDL 63 10/16/2019   LDLCALC 76 10/16/2019   TRIG 142 10/16/2019   CHOLHDL 2.6 10/16/2019    She has been working on diet and exercise for prediabetes (x 2010), and denies polydipsia, polyuria and visual disturbances. Last A1C in the office was:  Lab Results  Component Value Date   HGBA1C 5.7 (H) 10/16/2019   Last GFR  Lab Results  Component Value Date   GFRNONAA 63 03/16/2020   Patient is on Vitamin D supplement and at goal at recent check:  Lab Results  Component Value Date   VD25OH 92 10/16/2019      Medication Review Current Outpatient Medications on File Prior to Visit  Medication Sig Dispense Refill  . ALPRAZolam (XANAX) 0.5 MG tablet Take 0.5-1 tablets (0.25-0.5 mg total) by mouth daily as needed for anxiety. Take 1/2 tablet 1- 2 x /day ONLY if needed for Anxiety Attack &  limit to 5 days /week to avoid Addiction - DO NOT take with Hydrocodone or Codeine 10 tablet 0  . amLODipine (NORVASC) 10 MG tablet Take 1 tablet Daily  for BP & Heart 90 tablet 3  . aspirin EC 81 MG tablet Take 81 mg by mouth at bedtime.     . Cholecalciferol (VITAMIN D3) 5000 units CAPS Take 5,000 Units by mouth 2 (two) times daily.     . cyclobenzaprine (FLEXERIL) 5 MG tablet Take 1 tablet (5 mg total) by mouth 3 (three) times daily as needed for muscle spasms. 20 tablet 0  . denosumab (PROLIA) 60 MG/ML SOSY injection Inject  60 mg into the skin every 6 (six) months. 1 mL 0  . dexamethasone (DECADRON) 4 MG tablet Take 1 tab 3 x day - 3 days, then 2 x day - 3 days, then 1 tab daily 20 tablet 0  . diclofenac sodium (VOLTAREN) 1 % GEL Apply 2 g topically 4 (four) times daily. (Patient taking differently: Apply 2 g topically 4 (four) times daily as needed (pain).) 1 Tube 1  . diclofenac Sodium (VOLTAREN) 1 % GEL APPLY 4 GRAMS TOPICALLY 4 TIMES DAILY AS NEEDED FOR PAIN 500 g 0  . dicyclomine (BENTYL) 20 MG tablet Take 1 tablet (20 mg total) by mouth 3 (three) times daily as needed for spasms. Take 1 tablet 3 x  /day before meals for Cramping, Nausea or Diarhea 10 tablet 0  . DULoxetine (CYMBALTA) 30 MG capsule TAKE 1 CAPSULE BY MOUTH ONCE DAILY, FOR MOOD AND CHRONIC PAIN 90 capsule 1  . Fluticasone-Salmeterol (ADVAIR DISKUS) 250-50 MCG/DOSE AEPB Inhale 1 puff into the lungs in the morning and at bedtime. 60 each 3  . gabapentin (NEURONTIN) 300 MG capsule Take 1 capsule (300 mg total) by mouth 4 (four) times daily. 20 capsule 0  . Glycopyrrolate-Formoterol (BEVESPI AEROSPHERE) 9-4.8 MCG/ACT AERO Inhale 1 puff into the lungs daily.    [name] ipratropium-albuterol (DUONEB) 0.5-2.5 (3) MG/3ML SOLN Take 3 mLs by nebulization every 6 (six) hours as needed (shortness of breath/wheezing).     [name] ketoconazole (NIZORAL) 2 % cream Apply 1 application topically 2 x /day (Patient taking differently: Apply 1 application topically 2 (two) times daily. Apply 1 application topically 2 x /day) 60 g 3  . Magnesium 400 MG TABS Take 400 mg by mouth 2 (two) times daily.     . meclizine (ANTIVERT) 25 MG tablet Take 1/2 to 1 tablet 2 to 3 x /day ONLY if needed  For Dizziness / Vertigo (Patient taking differently: Take 12.5-25 mg by mouth 3 (three) times daily as needed for dizziness. Take 1/2 to 1 tablet 2 to 3 x /day ONLY if needed  For Dizziness / Vertigo) 270 tablet 1  . metoprolol tartrate (LOPRESSOR) 50 MG tablet Take 1 tablet 2 x /day for BP & Heart  (Patient taking differently: Take 25 mg by mouth at bedtime.) 180 tablet 3  . Multiple Vitamins-Minerals (MULTIVITAMIN WITH MINERALS) tablet Take 1 tablet by mouth daily.    . nitroGLYCERIN (NITROSTAT) 0.4 MG SL tablet Place 1 tablet (0.4 mg total) under the tongue every 5 (five) minutes as needed for chest pain. 90 tablet 3  . omeprazole (PRILOSEC) 40 MG capsule Take 1 capsule 2 x /day for Indigestion , Heartburn & Nausea (Patient taking differently: Take 40 mg by mouth 2 (two) times daily. Take 1 capsule 2 x /day for Indigestion , Heartburn & Nausea) 180 capsule 3  . polyethylene glycol powder (GLYCOLAX/MIRALAX) powder Take 17 g by mouth 2 (two) times daily. (Patient taking differently: Take 17 g by mouth daily.) 527 g 0  . prochlorperazine (COMPAZINE) 5 MG tablet  Take 1 tablet (5 mg total) by mouth every 6 (six) hours as needed for refractory nausea / vomiting. 10 tablet 0  . promethazine (PHENERGAN) 25 MG tablet Take     1 tablet       every 4 hours       ONLY      if Needed for Nausea & Vomitting 50 tablet 0  . rOPINIRole (REQUIP) 2 MG tablet Take 1 tablet (2 mg total) by mouth 3 (three) times daily. 30 tablet 0  . senna-docusate (SENOKOT-S) 8.6-50 MG tablet Take 2 tablets by mouth 2 (two) times daily. 60 tablet 1  . sucralfate (CARAFATE) 1 g tablet TAKE 1 TABLET BY MOUTH 4 TIMES DAILY BEFORE  MEALS  AND  AT  BEDTIME  FOR  INDIGESTION  AND  HEARTBURN 360 tablet 0  . trospium (SANCTURA) 20 MG tablet Take 20 mg by mouth 2 (two) times daily.    . vitamin B-12 (CYANOCOBALAMIN) 1000 MCG tablet Take 1,000 mcg by mouth daily.     Current Facility-Administered Medications on File Prior to Visit  Medication Dose Route Frequency Provider Last Rate Last Admin  . calcitonin (salmon) (MIACALCIN/FORTICAL) nasal spray 1 spray  1 spray Alternating Nares Daily Jessy Oto, MD        Current Problems (verified) Patient Active Problem List   Diagnosis Date Noted  . Insufficiency fracture of pelvis  12/29/2019    Class: Acute  . Multiple closed pelvic fractures without disruption of pelvic circle (HCC)   . Impaired glucose tolerance   . History of fundoplication   . Pubic ramus fracture ([place]) 12/20/2019  . Senile purpura ([place]) 07/05/2019  . Atherosclerosis of autologous vein coronary artery bypass graft with angina pectoris ([place]) 07/05/2019  . Abnormal glucose 03/01/2018  . FHx: heart disease 03/01/2018  . CKD (chronic kidney disease) stage 2, GFR 60-89 ml/min 09/27/2017  . T12 compression fracture ([place]) 09/26/2017  . Chronic bronchitis ([place]) 08/09/2017  . Atherosclerosis of aorta ([place]) 04/05/2017  . Spinal stenosis, lumbar region, with neurogenic claudication 05/29/2015  . DDD (degenerative disc disease), lumbar 04/21/2015  . Hyperlipidemia, mixed 11/24/2014  . Gastroesophageal reflux disease 11/24/2014  . Depression, major, in partial remission ([place]) 09/26/2014  . Chronic pain syndrome 09/26/2014  . Coronary artery disease involving native heart without angina pectoris   . Osteoporosis 02/10/2014  . Prediabetes 08/14/2013  . Vitamin D deficiency 08/14/2013  . MGUS (monoclonal gammopathy of unknown significance) 03/05/2013  . Vertebral compression fracture ([place]) 06/23/2012  . ESOPHAGEAL STRICTURE 08/28/2008  . Mild intermittent asthma without complication 47/65/4650  . Incarcerated recurrent paraesophageal hiatal hernia s/p lap redo repair [id number] 01/31/2008  . Essential hypertension 07/25/2007    Screening Tests Immunization History  Administered Date(s) Administered  . DT (Pediatric) 05/06/2014  . Influenza Split 02/07/2011  . Influenza Whole 02/09/2009, 01/19/2010, 02/13/2012  . Influenza, High Dose Seasonal PF 03/22/2013, 01/15/2014, 02/27/2015, 01/14/2016, 01/18/2017, 02/14/2018, 02/14/2019, 03/16/2020  . PFIZER SARS-COV-2 Vaccination 05/25/2019, 06/15/2019  . Pneumococcal Conjugate-13 05/06/2014  . Pneumococcal Polysaccharide-23 02/09/2011  .  Pneumococcal-Unspecified 02/06/2010, 02/09/2012  . Td 05/10/2003    Allergies Allergies  Allergen Reactions  . Ace Inhibitors Cough    Per Dr Melvyn Novas pulmonology 2013  . Aspartame Diarrhea and Nausea And Vomiting  . Atorvastatin Other (See Comments)    Muscle aches  . Biaxin [Clarithromycin] Other (See Comments)    Unknown reaction : PATIENT CAN TOLERATE Z-PAK.  Is written on patient's paper chart.  [name] [Roflumilast] Nausea And  Vomiting  . Erythromycin Other (See Comments)    Unknown reaction: PATIENT CAN TOLERATE Z-PAK.  It is written on patient's paper chart.  . Levofloxacin Nausea And Vomiting    SURGICAL HISTORY She  has a past surgical history that includes Back surgery; Bladder repair; Hemorrhoid surgery; Cholecystectomy; Appendectomy; Total abdominal hysterectomy (1975); Eye surgery (11/2000); Hiatal hernia repair (06/03/2011); Laparoscopic Nissen fundoplication (9/57/4734); Lumbar laminectomy/decompression microdiscectomy (Right, 06/26/2012); Esophagogastroduodenoscopy (N/A, 03/15/2013); Esophageal manometry (N/A, 03/25/2013); Hiatal hernia repair (N/A, 04/24/2013); Insertion of mesh (N/A, 04/24/2013); Laparoscopic lysis of adhesions (N/A, 04/24/2013); Fixation kyphoplasty lumbar spine (X 2); Coronary angioplasty (11/16/2001); Lumbar laminectomy/decompression microdiscectomy (N/A, 05/29/2015); Lumbar fusion (12/07/2015); Esophagogastroduodenoscopy (N/A, 12/25/2015); IR KYPHO THORACIC WITH BONE BIOPSY (09/29/2017); IR KYPHO THORACIC WITH BONE BIOPSY (11/03/2017); Upper gastrointestinal endoscopy; IR KYPHO THORACIC WITH BONE BIOPSY (02/14/2019); and IR KYPHO THORACIC WITH BONE BIOPSY (04/02/2019). FAMILY HISTORY Her family history includes Brain cancer in her brother; Colon cancer (age of onset: 48) in her mother; Heart attack (age of onset: 83) in her father; Heart disease in her brother and brother; Kidney disease in her daughter. SOCIAL HISTORY She  reports that she has never smoked.  She has never used smokeless tobacco. She reports that she does not drink alcohol and does not use drugs.  Review of Systems  Constitutional: Negative for malaise/fatigue and weight loss.  HENT: Negative for hearing loss and tinnitus.   Eyes: Negative for blurred vision and double vision.  Respiratory: Negative for cough, shortness of breath and wheezing.   Cardiovascular: Negative for chest pain, palpitations, orthopnea, claudication and leg swelling.  Gastrointestinal: Negative for abdominal pain, blood in stool, constipation, diarrhea, heartburn, melena, nausea and vomiting.  Genitourinary: Negative.   Musculoskeletal: Negative for joint pain and myalgias.  Skin: Negative for rash.  Neurological: Negative for dizziness, tingling, sensory change, weakness and headaches.  Endo/Heme/Allergies: Negative for polydipsia.  Psychiatric/Behavioral: Negative.   All other systems reviewed and are negative.    Objective:   Today's Vitals   04/20/20 1402  BP: 126/68  Pulse: 86  Temp: (!) 97.5 F (36.4 C)  SpO2: 98%  Weight: 153 lb (69.4 kg)   Body mass index is 26.26 kg/m.  General appearance: alert, no distress, WD/WN,  female HEENT: normocephalic, sclerae anicteric, TMs pearly, nares patent, no discharge or erythema, pharynx normal Oral cavity: MMM, no lesions Neck: supple, no lymphadenopathy, no thyromegaly, no masses Heart: RRR, normal S1, S2, no murmurs Lungs: Mildly diminished throughout, mild end expiratory wheezing, no rhonchi or stridor  Abdomen: +bs, rounded, non tender, no palpable masses, no hepatomegaly, no splenomegaly Musculoskeletal: nontender, no swelling, no obvious deformity Extremities: no edema, no cyanosis, no clubbing Pulses: 2+ symmetric, upper and lower extremities, normal cap refill Neurological: alert, oriented x 3, CN2-12 intact, strength 4/5 upper extremities and lower extremities, DTRs 2+ throughout, no cerebellar signs, gait antaglic with cane   Psychiatric: normal affect, behavior normal, pleasant    Izora Ribas, NP   04/20/2020

## 2020-04-21 LAB — COMPLETE METABOLIC PANEL WITH GFR
AG Ratio: 1.4 (calc) (ref 1.0–2.5)
ALT: 12 U/L (ref 6–29)
AST: 16 U/L (ref 10–35)
Albumin: 4.1 g/dL (ref 3.6–5.1)
Alkaline phosphatase (APISO): 100 U/L (ref 37–153)
BUN: 17 mg/dL (ref 7–25)
CO2: 22 mmol/L (ref 20–32)
Calcium: 9.7 mg/dL (ref 8.6–10.4)
Chloride: 106 mmol/L (ref 98–110)
Creat: 0.74 mg/dL (ref 0.60–0.88)
GFR, Est African American: 87 mL/min/{1.73_m2} (ref >=60)
GFR, Est Non African American: 75 mL/min/{1.73_m2} (ref >=60)
Globulin: 3 g/dL (calc) (ref 1.9–3.7)
Glucose, Bld: 91 mg/dL (ref 65–99)
Potassium: 4.5 mmol/L (ref 3.5–5.3)
Sodium: 142 mmol/L (ref 135–146)
Total Bilirubin: 0.5 mg/dL (ref 0.2–1.2)
Total Protein: 7.1 g/dL (ref 6.1–8.1)

## 2020-04-21 LAB — HEMOGLOBIN A1C
Hgb A1c MFr Bld: 5.9 % of total Hgb — ABNORMAL HIGH (ref <5.7)
Mean Plasma Glucose: 123 mg/dL
eAG (mmol/L): 6.8 mmol/L

## 2020-04-21 LAB — CBC WITH DIFFERENTIAL/PLATELET
Absolute Monocytes: 523 cells/uL (ref 200–950)
Basophils Absolute: 63 cells/uL (ref 0–200)
Basophils Relative: 1 %
Eosinophils Absolute: 485 cells/uL (ref 15–500)
Eosinophils Relative: 7.7 %
HCT: 42.2 % (ref 35.0–45.0)
Hemoglobin: 14.4 g/dL (ref 11.7–15.5)
Lymphs Abs: 2596 cells/uL (ref 850–3900)
MCH: 30.8 pg (ref 27.0–33.0)
MCHC: 34.1 g/dL (ref 32.0–36.0)
MCV: 90.4 fL (ref 80.0–100.0)
MPV: 11.6 fL (ref 7.5–12.5)
Monocytes Relative: 8.3 %
Neutro Abs: 2633 cells/uL (ref 1500–7800)
Neutrophils Relative %: 41.8 %
Platelets: 370 10*3/uL (ref 140–400)
RBC: 4.67 10*6/uL (ref 3.80–5.10)
RDW: 14.3 % (ref 11.0–15.0)
Total Lymphocyte: 41.2 %
WBC: 6.3 10*3/uL (ref 3.8–10.8)

## 2020-04-21 LAB — LIPID PANEL
Cholesterol: 162 mg/dL (ref <200)
HDL: 66 mg/dL (ref >=50)
LDL Cholesterol (Calc): 76 mg/dL (calc)
Non-HDL Cholesterol (Calc): 96 mg/dL (calc) (ref <130)
Total CHOL/HDL Ratio: 2.5 (calc) (ref <5.0)
Triglycerides: 116 mg/dL (ref <150)

## 2020-04-21 LAB — TSH: TSH: 1.27 mIU/L (ref 0.40–4.50)

## 2020-04-21 LAB — MAGNESIUM: Magnesium: 2.3 mg/dL (ref 1.5–2.5)

## 2020-04-23 ENCOUNTER — Ambulatory Visit: Payer: PPO | Admitting: Adult Health Nurse Practitioner

## 2020-04-24 ENCOUNTER — Other Ambulatory Visit: Payer: Self-pay | Admitting: Internal Medicine

## 2020-04-24 DIAGNOSIS — J209 Acute bronchitis, unspecified: Secondary | ICD-10-CM

## 2020-04-27 ENCOUNTER — Telehealth: Payer: Self-pay

## 2020-04-27 ENCOUNTER — Other Ambulatory Visit: Payer: Self-pay | Admitting: Adult Health

## 2020-04-27 DIAGNOSIS — R058 Other specified cough: Secondary | ICD-10-CM

## 2020-04-27 MED ORDER — PREDNISONE 20 MG PO TABS: ORAL_TABLET | ORAL | 0 refills | Status: DC

## 2020-04-27 MED ORDER — DOXYCYCLINE HYCLATE 100 MG PO CAPS: ORAL_CAPSULE | ORAL | 0 refills | Status: DC

## 2020-04-27 NOTE — Telephone Encounter (Signed)
Patient has completed the Prednisone this morning but still wheezing. Requesting another round. Please advise.

## 2020-04-27 NOTE — Telephone Encounter (Signed)
Prednisone seems to be helping but still wheezing some, doesn't want to be sick for Christmas. Has been taking the Breztri, BID. Please advise.

## 2020-04-28 NOTE — Telephone Encounter (Signed)
Patient notified

## 2020-04-29 ENCOUNTER — Other Ambulatory Visit: Payer: Self-pay | Admitting: Internal Medicine

## 2020-04-29 DIAGNOSIS — R112 Nausea with vomiting, unspecified: Secondary | ICD-10-CM

## 2020-05-12 ENCOUNTER — Other Ambulatory Visit: Payer: Self-pay | Admitting: Internal Medicine

## 2020-05-12 DIAGNOSIS — R112 Nausea with vomiting, unspecified: Secondary | ICD-10-CM

## 2020-05-18 DIAGNOSIS — M4726 Other spondylosis with radiculopathy, lumbar region: Secondary | ICD-10-CM | POA: Diagnosis not present

## 2020-05-18 DIAGNOSIS — M961 Postlaminectomy syndrome, not elsewhere classified: Secondary | ICD-10-CM | POA: Diagnosis not present

## 2020-05-18 DIAGNOSIS — G894 Chronic pain syndrome: Secondary | ICD-10-CM | POA: Diagnosis not present

## 2020-05-18 DIAGNOSIS — M150 Primary generalized (osteo)arthritis: Secondary | ICD-10-CM | POA: Diagnosis not present

## 2020-05-21 ENCOUNTER — Other Ambulatory Visit: Payer: Self-pay | Admitting: Internal Medicine

## 2020-05-26 ENCOUNTER — Other Ambulatory Visit: Payer: Self-pay | Admitting: Internal Medicine

## 2020-05-26 DIAGNOSIS — Z79899 Other long term (current) drug therapy: Secondary | ICD-10-CM

## 2020-05-26 DIAGNOSIS — K219 Gastro-esophageal reflux disease without esophagitis: Secondary | ICD-10-CM

## 2020-05-28 ENCOUNTER — Ambulatory Visit: Payer: PPO | Admitting: Adult Health Nurse Practitioner

## 2020-05-28 ENCOUNTER — Other Ambulatory Visit: Payer: Self-pay

## 2020-05-28 ENCOUNTER — Ambulatory Visit (INDEPENDENT_AMBULATORY_CARE_PROVIDER_SITE_OTHER): Payer: PPO | Admitting: Adult Health

## 2020-05-28 ENCOUNTER — Encounter: Payer: Self-pay | Admitting: Adult Health

## 2020-05-28 VITALS — BP 120/68 | HR 72 | Temp 97.7°F | Wt 158.0 lb

## 2020-05-28 DIAGNOSIS — R0781 Pleurodynia: Secondary | ICD-10-CM | POA: Diagnosis not present

## 2020-05-28 DIAGNOSIS — R8290 Unspecified abnormal findings in urine: Secondary | ICD-10-CM | POA: Diagnosis not present

## 2020-05-28 DIAGNOSIS — R109 Unspecified abdominal pain: Secondary | ICD-10-CM | POA: Diagnosis not present

## 2020-05-28 NOTE — Progress Notes (Signed)
Assessment and Plan:  Jaianna was seen today for poss kidney infection.  Diagnoses and all orders for this visit:  Flank pain R/o UTI per patient preference; discussed based on HPI and tenderness of R lower ribs suspect MSK etiology; she is reassure by my exam today.  She had recent CXR 04/20/2020, no falls since then, denies resp sx, known compression fractures last year; no midline tenderness today;  Current OTC meds are working well for pain management; she denies further needs, est with ortho to follow up if needed Can follow up if getting worse, any new or different sx -     Urinalysis w microscopic + reflex culture  Further disposition pending results of labs. Discussed med's effects and SE's.   Over 15 minutes of exam, counseling, chart review, and critical decision making was performed.   Future Appointments  Date Time Provider [facility] [facility]  07/22/2020 10:30 AM [name], NP [facility] None  09/17/2020 10:15 AM [name], MD [facility] None  10/15/2020 10:00 AM [name], NP [facility] None    ------------------------------------------------------------------------------------------------------------------   HPI \\BP  120/68   Pulse 72   Temp 97.7 F (36.5 C)   Wt 158 lb (71.7 kg)   SpO2 95%   BMI 27.12 kg/m   85 y.o.female presents for evaluation of R flank/back pain x 1 week. Patient has a chronic pain syndrome and is followed by Dr Nicholaus Bloom (pain management) and Dr Louanne Skye.  Hx of numerous lumbar surgeries, disc and fusions historically, most recently T8, T9 and T11 and T12 vertebral augmentation due to compression fractures. Today she reports just wanted to make sure not a kidney infection.   She reports constant R flank pain x 1 week, persistent, non-radiating, "ache," taking OTC "back aid" OTC, topical voltaren which is helpful.   She denies dysuria, urgency or frequency. She does endorse some strong urine odor about a week ago but reports has  resolved. Denies hematuria. Denies vaginal discharge or burning. Denies fever/chills  She denies hx of renal calculi; she has OAB, Dr. Roni Bread is managing, reports has been given a med for this but unsure what, will call back to report.    Past Medical History:  Diagnosis Date  . Anxiety    takes Xanax daily as needed  . Asthma    Albuterol daily as needed  . Blood transfusion without reported diagnosis   . CAD (coronary artery disease)    LHC 2003 with 40% pLAD, 30% mLAD, 100% D1 (moderate vessel), 100%  D2 (small vessel), 95% mid small OM2.  Pt had PTCA to D1;  D2 and OM2 small vessels and tx medically;  Last Myoview (2012): anterior infarct seen with scar, no ischemia. EF normal. Med Rx // Myoview (12/14):  Low risk; ant scar w/ peri-infarct ischemia; EF 55% with ant AK // Myoview 5/16: EF 55-65, ant, apical scar, no ischemia   . Cataract   . Chronic back pain    HNP, DDD, spinal stenosis, vertebral compression fractures.   . Chronic nausea    takes Zofran daily as needed.  normal gastric emptying study 03/2013  . CKD (chronic kidney disease), stage III ([place]) 09/27/2017  . Constipation    felt to be functional. takes Senokot and Miralax daily.  treated with Linzess in past.   . DIVERTICULOSIS, COLON 08/28/2008   Qualifier: Diagnosis of  By: [name], [name], [name]    . Elevated LFTs 2015   probably med induced DILI, from Augmentin vs Pravastatin  . GERD (gastroesophageal  reflux disease)    hx of esophageal stricture   . Hiatal hernia    Paraesophageal hernias. s/p fundoplications in 3295 and 04/2013  . History of bronchitis several of yrs ago  . History of colon polyps 03/2009, 03/2011   adenomatous colon polyps, no high grade dysplasia.   [name] History of vertigo    no meds  . Hyperlipidemia    takes Pravastatin daily  . Hypertension    takes Amlodipine,Metoprolol,and Losartan daily  . Nausea 03/2016  . Osteoporosis   . PONV (postoperative nausea and vomiting)    yrs ago  . Slow  urinary stream    takes Rapaflo daily  . TIA (transient ischemic attack) 1995   no residual effects noted     Allergies  Allergen Reactions  . Ace Inhibitors Cough    Per Dr Melvyn Novas pulmonology 2013  . Aspartame Diarrhea and Nausea And Vomiting  . Atorvastatin Other (See Comments)    Muscle aches  . Biaxin [Clarithromycin] Other (See Comments)    Unknown reaction : PATIENT CAN TOLERATE Z-PAK.  Is written on patient's paper chart.  [name] [Roflumilast] Nausea And Vomiting  . Erythromycin Other (See Comments)    Unknown reaction: PATIENT CAN TOLERATE Z-PAK.  It is written on patient's paper chart.  . Levofloxacin Nausea And Vomiting    Current Outpatient Medications on File Prior to Visit  Medication Sig  . ALPRAZolam (XANAX) 0.5 MG tablet Take 0.5-1 tablets (0.25-0.5 mg total) by mouth daily as needed for anxiety. Take 1/2 tablet 1- 2 x /day ONLY if needed for Anxiety Attack &  limit to 5 days /week to avoid Addiction - DO NOT take with Hydrocodone or Codeine  . amLODipine (NORVASC) 10 MG tablet Take 1 tablet Daily for BP & Heart  . aspirin EC 81 MG tablet Take 81 mg by mouth at bedtime.   . Cholecalciferol (VITAMIN D3) 5000 units CAPS Take 5,000 Units by mouth 2 (two) times daily.   . cyclobenzaprine (FLEXERIL) 5 MG tablet Take 1 tablet (5 mg total) by mouth 3 (three) times daily as needed for muscle spasms.  [name] denosumab (PROLIA) 60 MG/ML SOSY injection Inject 60 mg into the skin every 6 (six) months.  . diclofenac sodium (VOLTAREN) 1 % GEL Apply 2 g topically 4 (four) times daily. (Patient taking differently: Apply 2 g topically 4 (four) times daily as needed (pain).)  . dicyclomine (BENTYL) 20 MG tablet Take 1 tablet (20 mg total) by mouth 3 (three) times daily as needed for spasms. Take 1 tablet 3 x  /day before meals for Cramping, Nausea or Diarhea  . DULoxetine (CYMBALTA) 30 MG capsule TAKE 1 CAPSULE BY MOUTH ONCE DAILY, FOR MOOD AND CHRONIC PAIN  . Fluticasone-Salmeterol  (ADVAIR DISKUS) 250-50 MCG/DOSE AEPB Inhale 1 puff into the lungs in the morning and at bedtime.  . gabapentin (NEURONTIN) 300 MG capsule Take 1 capsule (300 mg total) by mouth 4 (four) times daily.  . Glycopyrrolate-Formoterol (BEVESPI AEROSPHERE) 9-4.8 MCG/ACT AERO Inhale 1 puff into the lungs daily.  [name] ipratropium-albuterol (DUONEB) 0.5-2.5 (3) MG/3ML SOLN Take 3 mLs by nebulization every 6 (six) hours as needed (shortness of breath/wheezing).  [name] ketoconazole (NIZORAL) 2 % cream Apply 1 application topically 2 x /day (Patient taking differently: Apply 1 application topically 2 (two) times daily. Apply 1 application topically 2 x /day)  . Magnesium 400 MG TABS Take 400 mg by mouth 2 (two) times daily.   . meclizine (ANTIVERT) 25 MG tablet Take  1/2 to 1 tablet 2 to 3 x /day ONLY if needed  For Dizziness / Vertigo (Patient taking differently: Take 12.5-25 mg by mouth 3 (three) times daily as needed for dizziness. Take 1/2 to 1 tablet 2 to 3 x /day ONLY if needed  For Dizziness / Vertigo)  . metoprolol tartrate (LOPRESSOR) 50 MG tablet TAKE 1 TABLET BY MOUTH TWICE DAILY FOR BLOOD PRESSURE AND FOR HEART  . Multiple Vitamins-Minerals (MULTIVITAMIN WITH MINERALS) tablet Take 1 tablet by mouth daily.  . nitroGLYCERIN (NITROSTAT) 0.4 MG SL tablet Place 1 tablet (0.4 mg total) under the tongue every 5 (five) minutes as needed for chest pain.  [name] omeprazole (PRILOSEC) 40 MG capsule TAKE 1 CAPSULE BY MOUTH TWICE DAILY FOR  INDIGESTION,  HEARTBURN  AND  NAUSEA  . polyethylene glycol powder (GLYCOLAX/MIRALAX) powder Take 17 g by mouth 2 (two) times daily. (Patient taking differently: Take 17 g by mouth daily.)  . prochlorperazine (COMPAZINE) 5 MG tablet Take 1 tablet (5 mg total) by mouth every 6 (six) hours as needed for refractory nausea / vomiting.  . promethazine (PHENERGAN) 25 MG tablet Take     1 tablet       every 4 hours       ONLY      if Needed for Nausea & Vomitting  . promethazine-dextromethorphan  (PROMETHAZINE-DM) 6.25-15 MG/5ML syrup Take 5 mLs by mouth 4 (four) times daily as needed for cough.  [name] rOPINIRole (REQUIP) 2 MG tablet Take 1 tablet (2 mg total) by mouth 3 (three) times daily.  [name] senna-docusate (SENOKOT-S) 8.6-50 MG tablet Take 2 tablets by mouth 2 (two) times daily.  . sucralfate (CARAFATE) 1 g tablet TAKE 1 TABLET BY MOUTH 4 TIMES DAILY BEFORE  MEALS  AND  AT  BEDTIME  FOR  INDIGESTION  AND  HEARTBURN  . trospium (SANCTURA) 20 MG tablet Take 20 mg by mouth 2 (two) times daily.  . vitamin B-12 (CYANOCOBALAMIN) 1000 MCG tablet Take 1,000 mcg by mouth daily.   Current Facility-Administered Medications on File Prior to Visit  Medication  . calcitonin (salmon) (MIACALCIN/FORTICAL) nasal spray 1 spray    ROS: all negative except above.   Physical Exam:  BP 120/68   Pulse 72   Temp 97.7 F (36.5 C)   Wt 158 lb (71.7 kg)   SpO2 95%   BMI 27.12 kg/m   General Appearance: Well nourished, well dressed elderly female in no apparent distress. Eyes: PERRLA, conjunctiva no swelling or erythema ENT/Mouth: Mask in place; Hearing normal.  Neck: Supple Respiratory: Respiratory effort normal, BS equal bilaterally without rales, rhonchi, wheezing or stridor.  Cardio: RRR with 2/6 murmur. Brisk peripheral pulses without edema.  Abdomen: Soft, + BS.  Non tender, no guarding. Lymphatics: Non tender without lymphadenopathy.  Musculoskeletal: symmetrical upper and lower extremity strength, slow steady gait with cane. She does not have midline spinous tenderness, no muscle spasm; she has mild tenderness of right lower ribs without palpable bony abnormality  Skin: Warm, dry without rashes, lesions, ecchymosis.  Neuro: Cranial nerves intact. Normal muscle tone, no cerebellar symptoms. Sensation intact.  Psych: Awake and oriented X 3, normal affect, Insight and Judgment appropriate.     Izora Ribas, NP 12:49 PM [facility]

## 2020-05-29 LAB — NO CULTURE INDICATED

## 2020-05-29 LAB — URINALYSIS W MICROSCOPIC + REFLEX CULTURE
Bacteria, UA: NONE SEEN /HPF
Bilirubin Urine: NEGATIVE
Glucose, UA: NEGATIVE
Hgb urine dipstick: NEGATIVE
Hyaline Cast: NONE SEEN /LPF
Ketones, ur: NEGATIVE
Leukocyte Esterase: NEGATIVE
Nitrites, Initial: NEGATIVE
Protein, ur: NEGATIVE
RBC / HPF: NONE SEEN /HPF (ref 0–2)
Specific Gravity, Urine: 1.007 (ref 1.001–1.03)
Squamous Epithelial / HPF: NONE SEEN /HPF (ref <=5)
WBC, UA: NONE SEEN /HPF (ref 0–5)
pH: 7 (ref 5.0–8.0)

## 2020-05-31 ENCOUNTER — Other Ambulatory Visit: Payer: Self-pay | Admitting: Internal Medicine

## 2020-05-31 DIAGNOSIS — R112 Nausea with vomiting, unspecified: Secondary | ICD-10-CM

## 2020-06-01 ENCOUNTER — Telehealth: Payer: Self-pay

## 2020-06-01 NOTE — Telephone Encounter (Signed)
Patient states that she can't seem to get rid of her cough. Is taking Tessalon and doing the Promethazine DM along with the inhaler. Is there anything else that she can take? Please advise.

## 2020-06-02 ENCOUNTER — Other Ambulatory Visit: Payer: Self-pay

## 2020-06-02 MED ORDER — ROPINIROLE HCL 2 MG PO TABS
2.0000 mg | ORAL_TABLET | Freq: Three times a day (TID) | ORAL | 0 refills | Status: DC
Start: 2020-06-02 — End: 2020-06-04

## 2020-06-02 NOTE — Telephone Encounter (Signed)
Patient states that it is in her chest and advised her to call the pulmonologist.

## 2020-06-04 ENCOUNTER — Other Ambulatory Visit: Payer: Self-pay

## 2020-06-04 MED ORDER — ROPINIROLE HCL 2 MG PO TABS
2.0000 mg | ORAL_TABLET | Freq: Three times a day (TID) | ORAL | 0 refills | Status: DC
Start: 2020-06-04 — End: 2020-07-31

## 2020-06-09 NOTE — Progress Notes (Signed)
History of Present Illness:        This very nice 85y.o.MWFwith HTN, ASCAD,HLD, COPD /Asthma,  Pre-Diabetes,GERD,Vitamin D Deficiency,chronic pain from DJD/DDD presents with c/o exacerbation of asthmatic bronchitis.Also , shes c/o mid to low back pain after lifting a 6 mo old [place] baby   Medications  .  denosumab (PROLIA) 60 MG/ML SOSY injection, Inject 60 mg into the skin every 6 (six) months. .  calcitonin (salmon) (MIACALCIN/FORTICAL) nasal spray 1 spray   .  amLODipine (NORVASC) 10 MG tablet, Take 1 tablet Daily for BP & Heart .  metoprolol tartrate (LOPRESSOR) 50 MG tablet, TAKE 1 TABLET BY MOUTH TWICE DAILY FOR BLOOD PRESSURE AND FOR HEART .  nitroGLYCERIN (NITROSTAT) 0.4 MG SL tablet, Place 1 tablet (0.4 mg total) under the tongue every 5 (five) minutes as needed for chest pain. [name]  aspirin EC 81 MG tablet, Take 81 mg by mouth at bedtime  .  Fluticasone-Salmeterol (ADVAIR DISKUS) 250-50 MCG/DOSE AEPB, Inhale 1 puff into the lungs in the morning and at bedtime. (not using_)  .  Glycopyrrolate-Formoterol (BEVESPI AEROSPHERE) 9-4.8 MCG/ACT AERO, Inhale 1 puff into the lungs daily. [name]  ipratropium-albuterol (DUONEB) 0.5-2.5 (3) MG/3ML SOLN, Take 3 mLs by nebulization every 6 (six) hours as needed (shortness of breath/wheezing).  .  promethazine (PHENERGAN) 25 MG tablet, Take     1 tablet       every 4 hours       ONLY      if Needed for Nausea & Vomitting .  promethazine-DM 6.25-15 MG/5ML syrup, Take 5 mLs b 4 (four) times daily as needed for cough.  .  vitamin B-12 1000 MCG tablet, Take   daily.  [name]  ALPRAZolam (XANAX) 0.5 MG tablet,  Take 1/2 tablet 1- 2 x /day ONLY if needed   .  Cholecalciferol (VITAMIN D3) 5000 units CAPS, Take 5,000 Units 2 (two) times daily.  .  cyclobenzaprine (FLEXERIL) 5 MG tablet, Take 1 tablet (5 mg total)  3 (three) times daily as needed for muscle spasms. .  diclofenac 1 % GEL, Apply 2 g topically 4 (four) times daily. (Patient taking  differently: Apply 2 g topically 4 (four) times daily as needed (pain).) .  dicyclomine (BENTYL) 20 MG tablet, Take 1 tablet (20 mg total) by mouth 3 (three) times daily as needed for spasms. Take 1 tablet 3 x  /day before meals for Cramping, Nausea or Diarhea .  DULoxetine (CYMBALTA) 30 MG capsule, TAKE 1 CAPSULE BY MOUTH ONCE DAILY, FOR MOOD AND CHRONIC PAIN .  gabapentin (NEURONTIN) 300 MG capsule, Take 1 capsule (300 mg total) by mouth 4 (four) times daily. [name]  ketoconazole (NIZORAL) 2 % cream, Apply 1 application topically 2 x /day (Patient taking differently: Apply 1 application topically 2 (two) times daily. Apply 1 application topically 2 x /day) .  Magnesium 400 MG TABS, Take 400 mg by mouth 2 (two) times daily.  .  meclizine (ANTIVERT) 25 MG tablet, Take 1/2 to 1 tablet 2 to 3 x /day ONLY if needed  For Dizziness / Vertigo (Patient taking differently: Take 12.5-25 mg by mouth 3 (three) times daily as needed for dizziness. Take 1/2 to 1 tablet 2 to 3 x /day ONLY if needed  For Dizziness / Vertigo) .  Multiple Vitamins-Minerals (MULTIVITAMIN WITH MINERALS) tablet, Take 1 tablet by mouth daily. [name]  omeprazole (PRILOSEC) 40 MG capsule, TAKE 1 CAPSULE  TWICE DAILY FOR  INDIGESTION,  HEARTBURN  AND  NAUSEA .  polyethylene glycol powder (GLYCOLAX/MIRALAX) powder, Take 17 g by mouth 2 (two) times daily. (Patient taking differently: Take 17 g by mouth daily.) .  prochlorperazine (COMPAZINE) 5 MG tablet, Take 1 tablet (5 mg total) by mouth every 6 (six) hours as needed for refractory nausea / vomiting. [name]  rOPINIRole (REQUIP) 2 MG tablet, Take 1 tablet (2 mg total) by mouth 3 (three) times daily. [name]  senna-docusate (SENOKOT-S) 8.6-50 MG tablet, Take 2 tablets by mouth 2 (two) times daily. .  sucralfate (CARAFATE) 1 g tablet, TAKE 1 TABLET BY MOUTH 4 TIMES DAILY BEFORE  MEALS  AND  AT  BEDTIME  FOR  INDIGESTION  AND  HEARTBURN .  trospium (SANCTURA) 20 MG tablet, Take 20 mg by mouth 2 (two) times  daily.   Problem list She has Essential hypertension; Mild intermittent asthma without complication; ESOPHAGEAL STRICTURE; Incarcerated recurrent paraesophageal hiatal hernia s/p lap redo repair [id number]; Vertebral compression fracture (HCC); MGUS (monoclonal gammopathy of unknown significance); Prediabetes; Vitamin D deficiency; Osteoporosis; Coronary artery disease involving native heart without angina pectoris; Depression, major, in partial remission ([place]); Chronic pain syndrome; Hyperlipidemia, mixed; Gastroesophageal reflux disease; DDD (degenerative disc disease), lumbar; Spinal stenosis, lumbar region, with neurogenic claudication; Atherosclerosis of aorta ([place]); Chronic bronchitis (HCC); T12 compression fracture (HCC); CKD (chronic kidney disease) stage 2, GFR 60-89 ml/min; Abnormal glucose; FHx: heart disease; Senile purpura ([place]); Atherosclerosis of autologous vein coronary artery bypass graft with angina pectoris ([place]); Pubic ramus fracture ([place]); Multiple closed pelvic fractures without disruption of pelvic circle ([place]); Impaired glucose tolerance; History of fundoplication; and Insufficiency fracture of pelvis on their problem list.   Observations/Objective:  BP 126/72   Pulse 96   Temp (!) 97.2 F (36.2 C)   Resp 16   Ht 5\' 4"  (1.626 m)   Wt 158 lb 9.6 oz (71.9 kg)   SpO2 99%   BMI 27.22 kg/m   No stridor. Dry hacking cough.   HEENT - WNL. Neck - supple.  Chest - Few scattered rales & rhonchi clearing partially with cough.  (+) end terminal forced post tussive fine wheezes Cor - Nl HS. RRR w/o sig MGR. PP 1(+). No edema. MS- FROM w/o deformities.  Gait Nl. Neuro -  Nl w/o focal abnormalities.  Assessment and Plan:  1. Mild intermittent asthmatic bronchitis without complication  - dexamethasone  4 MG; Take 1 tab 3 x day - 3 days, then 2 x day - 3 days, then 1  daily  Disp: 20 tab  - azithromycin  250 MG tablet; Take 2 tablets  Day 1, then 1  Daily   Disp: 6 each; Rf:  1  2. Encounter for screening for COVID-19  - POC COVID-19 -->>  Negative  3. Back strain, initial encounter  - cyclobenzaprine  10 MG ; Take  1 tablet  3 x /day  as needed Disp: 90   - dexamethasone 4 MG ; Take 1 tab 3 x day - 3 days, then 2 x day - 3 days, then 1 daily  Disp: 20 tab   Follow Up Instructions:      I discussed the assessment and treatment plan with the patient. The patient was provided an opportunity to ask questions and all were answered. The patient agreed with the plan and demonstrated an understanding of the instructions.      The patient was advised to call back or seek an in-person evaluation if the symptoms worsen or if the condition fails to improve as anticipated.  Kirtland Bouchard, MD

## 2020-06-10 ENCOUNTER — Ambulatory Visit (INDEPENDENT_AMBULATORY_CARE_PROVIDER_SITE_OTHER): Payer: PPO | Admitting: Internal Medicine

## 2020-06-10 ENCOUNTER — Other Ambulatory Visit: Payer: Self-pay

## 2020-06-10 VITALS — BP 126/72 | HR 96 | Temp 97.2°F | Resp 16 | Ht 64.0 in | Wt 158.6 lb

## 2020-06-10 DIAGNOSIS — Z1152 Encounter for screening for COVID-19: Secondary | ICD-10-CM | POA: Diagnosis not present

## 2020-06-10 DIAGNOSIS — J4520 Mild intermittent asthma, uncomplicated: Secondary | ICD-10-CM | POA: Diagnosis not present

## 2020-06-10 DIAGNOSIS — S39012A Strain of muscle, fascia and tendon of lower back, initial encounter: Secondary | ICD-10-CM

## 2020-06-10 LAB — POC COVID19 BINAXNOW: SARS Coronavirus 2 Ag: NEGATIVE

## 2020-06-10 MED ORDER — CYCLOBENZAPRINE HCL 10 MG PO TABS: ORAL_TABLET | ORAL | 0 refills | Status: DC

## 2020-06-10 MED ORDER — AZITHROMYCIN 250 MG PO TABS: ORAL_TABLET | ORAL | 1 refills | Status: DC

## 2020-06-10 MED ORDER — DEXAMETHASONE 4 MG PO TABS: ORAL_TABLET | ORAL | 0 refills | Status: DC

## 2020-06-13 ENCOUNTER — Encounter: Payer: Self-pay | Admitting: Internal Medicine

## 2020-07-06 ENCOUNTER — Other Ambulatory Visit: Payer: Self-pay | Admitting: Internal Medicine

## 2020-07-06 DIAGNOSIS — R112 Nausea with vomiting, unspecified: Secondary | ICD-10-CM

## 2020-07-08 ENCOUNTER — Ambulatory Visit (INDEPENDENT_AMBULATORY_CARE_PROVIDER_SITE_OTHER): Payer: PPO | Admitting: Gastroenterology

## 2020-07-08 ENCOUNTER — Encounter: Payer: Self-pay | Admitting: Gastroenterology

## 2020-07-08 VITALS — BP 120/76 | HR 85 | Ht 64.0 in | Wt 156.6 lb

## 2020-07-08 DIAGNOSIS — R0789 Other chest pain: Secondary | ICD-10-CM | POA: Diagnosis not present

## 2020-07-08 DIAGNOSIS — R1013 Epigastric pain: Secondary | ICD-10-CM | POA: Diagnosis not present

## 2020-07-08 DIAGNOSIS — R1310 Dysphagia, unspecified: Secondary | ICD-10-CM | POA: Diagnosis not present

## 2020-07-08 MED ORDER — FLUCONAZOLE 200 MG PO TABS: ORAL_TABLET | ORAL | 0 refills | Status: DC

## 2020-07-08 NOTE — Patient Instructions (Addendum)
If you are age 83 or older, your body mass index should be between 23-30. Your Body mass index is 26.88 kg/m. If this is out of the aforementioned range listed, please consider follow up with your Primary Care Provider.  If you are age 30 or younger, your body mass index should be between 19-25. Your Body mass index is 26.88 kg/m. If this is out of the aformentioned range listed, please consider follow up with your Primary Care Provider.   We have sent the following medications to your pharmacy for you to pick up at your convenience: Fluconazole 400 mg on day 1, then 200 mg daily for 13 days.  Crush Carafate tablet and mix with water and drink four times daily.

## 2020-07-08 NOTE — Progress Notes (Signed)
07/08/2020 [name] [name] [id number] Oct 10, 1935   HISTORY OF PRESENT ILLNESS: This is an 85 year old female with a patient Dr. Doyne Keel.  She follows here with issues associated with GERD.  She is here today with her husband with complaints of epigastric abdominal pain, nausea, pain with swallowing.  She is at the same complaints in the past.  She says that on this occasion these symptoms started about 1-2 weeks ago.  Prior to that she was doing well.  She is on omeprazole 40 mg twice daily.  She also takes Carafate tablet 4 times daily.  She says that she has been taking a lot of Tums to help relieve this.  She says that if she burps that helps to relieve it somewhat.  Her appetite has been poor because of the symptoms.  EGD 07/2019 showed the following:  - Z-line regular. - Esophageal plaques were found, consistent with candidiasis. - Tortuous esophagus with angulated turn entering Nissen. - Normal esophagus otherwise - A Nissen fundoplication was found. The wrap appears intact with retained suture visualized. - Normal stomach otherwise - biopsies taken to rule out H pylori--negative for Hpylori - Normal duodenal bulb and second portion of the duodenum.    Past Medical History:  Diagnosis Date   Anxiety    takes Xanax daily as needed   Asthma    Albuterol daily as needed   Blood transfusion without reported diagnosis    CAD (coronary artery disease)    LHC 2003 with 40% pLAD, 30% mLAD, 100% D1 (moderate vessel), 100%  D2 (small vessel), 95% mid small OM2.  Pt had PTCA to D1;  D2 and OM2 small vessels and tx medically;  Last Myoview (2012): anterior infarct seen with scar, no ischemia. EF normal. Med Rx // Myoview (12/14):  Low risk; ant scar w/ peri-infarct ischemia; EF 55% with ant AK // Myoview 5/16: EF 55-65, ant, apical scar, no ischemia    Cataract    Chronic back pain    HNP, DDD, spinal stenosis, vertebral compression fractures.    Chronic nausea    takes  Zofran daily as needed.  normal gastric emptying study 03/2013   CKD (chronic kidney disease), stage III ([place]) 09/27/2017   Constipation    felt to be functional. takes Senokot and Miralax daily.  treated with Linzess in past.    DIVERTICULOSIS, COLON 08/28/2008   Qualifier: Diagnosis of  By: [name], [name], [name]     Elevated LFTs 2015   probably med induced DILI, from Augmentin vs Pravastatin   GERD (gastroesophageal reflux disease)    hx of esophageal stricture    Hiatal hernia    Paraesophageal hernias. s/p fundoplications in 9381 and 04/2013   History of bronchitis several of yrs ago   History of colon polyps 03/2009, 03/2011   adenomatous colon polyps, no high grade dysplasia.    History of vertigo    no meds   Hyperlipidemia    takes Pravastatin daily   Hypertension    takes Amlodipine,Metoprolol,and Losartan daily   Nausea 03/2016   Osteoporosis    PONV (postoperative nausea and vomiting)    yrs ago   Slow urinary stream    takes Rapaflo daily   TIA (transient ischemic attack) 1995   no residual effects noted   Past Surgical History:  Procedure Laterality Date   APPENDECTOMY     patient unsure of date   [place]  Patient unsure of date.   CORONARY ANGIOPLASTY  11/16/2001   1 stent   ESOPHAGEAL MANOMETRY N/A 03/25/2013   Procedure: ESOPHAGEAL MANOMETRY (EM);  Surgeon: Inda Castle, MD;  Location: [facility];  Service: Endoscopy;  Laterality: N/A;   ESOPHAGOGASTRODUODENOSCOPY N/A 03/15/2013   Procedure: ESOPHAGOGASTRODUODENOSCOPY (EGD);  Surgeon: Jerene Bears, MD;  Location[facility] ENDOSCOPY;  Service: Endoscopy;  Laterality: N/A;   ESOPHAGOGASTRODUODENOSCOPY N/A 12/25/2015   Procedure: ESOPHAGOGASTRODUODENOSCOPY (EGD);  Surgeon: Doran Stabler, MD;  Location: [facility] ENDOSCOPY;  Service: Endoscopy;  Laterality: N/A;   EYE SURGERY  11/2000   bilateral cataracts with lens implant   FIXATION  KYPHOPLASTY LUMBAR SPINE  X 2   "L3-4"   HEMORRHOID SURGERY     HIATAL HERNIA REPAIR  06/03/2011   Procedure: LAPAROSCOPIC REPAIR OF HIATAL HERNIA;  Surgeon: Adin Hector, MD;  Location: [facility];  Service: General;  Laterality: N/A;   HIATAL HERNIA REPAIR N/A 04/24/2013   Procedure: LAPAROSCOPIC  REPAIR RECURRENT PARASOPHAGEAL HIATAL HERNIA WITH FUNDOPLICATION;  Surgeon: Adin Hector, MD;  Location: [facility];  Service: General;  Laterality: N/A;   INSERTION OF MESH N/A 04/24/2013   Procedure: INSERTION OF MESH;  Surgeon: Adin Hector, MD;  Location: [facility];  Service: General;  Laterality: N/A;   IR KYPHO THORACIC WITH BONE BIOPSY  09/29/2017   IR KYPHO THORACIC WITH BONE BIOPSY  11/03/2017   IR KYPHO THORACIC WITH BONE BIOPSY  02/14/2019   IR KYPHO THORACIC WITH BONE BIOPSY  04/02/2019   LAPAROSCOPIC LYSIS OF ADHESIONS N/A 04/24/2013   Procedure: LAPAROSCOPIC LYSIS OF ADHESIONS;  Surgeon: Adin Hector, MD;  Location: [facility];  Service: General;  Laterality: N/A;   LAPAROSCOPIC NISSEN FUNDOPLICATION  1/47/8295   Procedure: LAPAROSCOPIC NISSEN FUNDOPLICATION;  Surgeon: Adin Hector, MD;  Location: [facility];  Service: General;  Laterality: N/A;   LUMBAR FUSION  12/07/2015   Right L2-3 facetectomy, posterolateral fusion L2-3 with pedicle screws, rods, local bone graft, Vivigen cancellous chips. Fusion extended to the L1 level with pedicle screws and rods   LUMBAR LAMINECTOMY/DECOMPRESSION MICRODISCECTOMY Right 06/26/2012   Procedure: LUMBAR LAMINECTOMY/DECOMPRESSION MICRODISCECTOMY;  Surgeon: Jessy Oto, MD;  Location: [place];  Service: Orthopedics;  Laterality: Right;  RIGHT L4-5 MICRODISCECTOMY   LUMBAR LAMINECTOMY/DECOMPRESSION MICRODISCECTOMY N/A 05/29/2015   Procedure: RIGHT L2-3 MICRODISCECTOMY;  Surgeon: Jessy Oto, MD;  Location: [place];  Service: Orthopedics;  Laterality: N/A;   TOTAL ABDOMINAL HYSTERECTOMY  1975   partial   UPPER GASTROINTESTINAL ENDOSCOPY       reports that she has never smoked. She has never used smokeless tobacco. She reports that she does not drink alcohol and does not use drugs. family history includes Brain cancer in her brother; Colon cancer (age of onset: 14) in her mother; Heart attack (age of onset: 68) in her father; Heart disease in her brother and brother; Kidney disease in her daughter. Allergies  Allergen Reactions   Ace Inhibitors Cough    Per Dr Melvyn Novas pulmonology 2013   Aspartame Diarrhea and Nausea And Vomiting   Atorvastatin Other (See Comments)    Muscle aches   Biaxin [Clarithromycin] Other (See Comments)    Unknown reaction : PATIENT CAN TOLERATE Z-PAK.  Is written on patient's paper chart.   Daliresp [Roflumilast] Nausea And Vomiting   Erythromycin Other (See Comments)    Unknown reaction: PATIENT CAN TOLERATE Z-PAK.  It is written on patient's paper chart.   Levofloxacin Nausea And Vomiting  Outpatient Encounter Medications as of 07/08/2020  Medication Sig   ALPRAZolam (XANAX) 0.5 MG tablet Take 0.5-1 tablets (0.25-0.5 mg total) by mouth daily as needed for anxiety. Take 1/2 tablet 1- 2 x /day ONLY if needed for Anxiety Attack &  limit to 5 days /week to avoid Addiction - DO NOT take with Hydrocodone or Codeine   amLODipine (NORVASC) 10 MG tablet Take 1 tablet Daily for BP & Heart   aspirin EC 81 MG tablet Take 81 mg by mouth at bedtime.    Cholecalciferol (VITAMIN D3) 5000 units CAPS Take 5,000 Units by mouth 2 (two) times daily.    cyclobenzaprine (FLEXERIL) 10 MG tablet Take  1 tablet  3 x /day  as needed for back pain /muscle spasm   denosumab (PROLIA) 60 MG/ML SOSY injection Inject 60 mg into the skin every 6 (six) months.   diclofenac sodium (VOLTAREN) 1 % GEL Apply 2 g topically 4 (four) times daily. (Patient taking differently: Apply 2 g topically 4 (four) times daily as needed (pain).)   dicyclomine (BENTYL) 20 MG tablet Take 1 tablet (20 mg total) by mouth 3 (three) times  daily as needed for spasms. Take 1 tablet 3 x  /day before meals for Cramping, Nausea or Diarhea   DULoxetine (CYMBALTA) 30 MG capsule TAKE 1 CAPSULE BY MOUTH ONCE DAILY, FOR MOOD AND CHRONIC PAIN   fluconazole (DIFLUCAN) 200 MG tablet Take 2 tablets (400 mg total) by mouth daily for 1 day, THEN 1 tablet (200 mg total) daily for 13 days.   gabapentin (NEURONTIN) 300 MG capsule Take 1 capsule (300 mg total) by mouth 4 (four) times daily.   ketoconazole (NIZORAL) 2 % cream Apply 1 application topically 2 x /day (Patient taking differently: Apply 1 application topically 2 (two) times daily. Apply 1 application topically 2 x /day)   Magnesium 400 MG TABS Take 400 mg by mouth 2 (two) times daily.    meclizine (ANTIVERT) 25 MG tablet Take 1/2 to 1 tablet 2 to 3 x /day ONLY if needed  For Dizziness / Vertigo (Patient taking differently: Take 12.5-25 mg by mouth 3 (three) times daily as needed for dizziness. Take 1/2 to 1 tablet 2 to 3 x /day ONLY if needed  For Dizziness / Vertigo)   metoprolol tartrate (LOPRESSOR) 50 MG tablet TAKE 1 TABLET BY MOUTH TWICE DAILY FOR BLOOD PRESSURE AND FOR HEART   Multiple Vitamins-Minerals (MULTIVITAMIN WITH MINERALS) tablet Take 1 tablet by mouth daily.   nitroGLYCERIN (NITROSTAT) 0.4 MG SL tablet Place 1 tablet (0.4 mg total) under the tongue every 5 (five) minutes as needed for chest pain.   omeprazole (PRILOSEC) 40 MG capsule TAKE 1 CAPSULE BY MOUTH TWICE DAILY FOR  INDIGESTION,  HEARTBURN  AND  NAUSEA   prochlorperazine (COMPAZINE) 5 MG tablet Take 1 tablet (5 mg total) by mouth every 6 (six) hours as needed for refractory nausea / vomiting.   promethazine (PHENERGAN) 25 MG tablet Take     1 tablet       every 4 hours       ONLY      if Needed for Nausea & Vomitting   rOPINIRole (REQUIP) 2 MG tablet Take 1 tablet (2 mg total) by mouth 3 (three) times daily.   senna-docusate (SENOKOT-S) 8.6-50 MG tablet Take 2 tablets by mouth 2 (two) times daily.    sucralfate (CARAFATE) 1 g tablet TAKE 1 TABLET BY MOUTH 4 TIMES DAILY BEFORE  MEALS  AND  AT  BEDTIME  FOR  INDIGESTION  AND  HEARTBURN   trospium (SANCTURA) 20 MG tablet Take 20 mg by mouth 2 (two) times daily.   vitamin B-12 (CYANOCOBALAMIN) 1000 MCG tablet Take 1,000 mcg by mouth daily.   [DISCONTINUED] azithromycin (ZITHROMAX) 250 MG tablet Take 2 tablets with Food on  Day 1, then 1 tablet Daily with Food for Sinusitis / Bronchitis   [DISCONTINUED] dexamethasone (DECADRON) 4 MG tablet Take 1 tab 3 x day - 3 days, then 2 x day - 3 days, then 1 tab daily   [DISCONTINUED] Fluticasone-Salmeterol (ADVAIR DISKUS) 250-50 MCG/DOSE AEPB Inhale 1 puff into the lungs in the morning and at bedtime.   [DISCONTINUED] Glycopyrrolate-Formoterol (BEVESPI AEROSPHERE) 9-4.8 MCG/ACT AERO Inhale 1 puff into the lungs daily.   [DISCONTINUED] ipratropium-albuterol (DUONEB) 0.5-2.5 (3) MG/3ML SOLN Take 3 mLs by nebulization every 6 (six) hours as needed (shortness of breath/wheezing).   [DISCONTINUED] polyethylene glycol powder (GLYCOLAX/MIRALAX) powder Take 17 g by mouth 2 (two) times daily. (Patient taking differently: Take 17 g by mouth daily.)   [DISCONTINUED] promethazine-dextromethorphan (PROMETHAZINE-DM) 6.25-15 MG/5ML syrup Take 5 mLs by mouth 4 (four) times daily as needed for cough.   Facility-Administered Encounter Medications as of 07/08/2020  Medication   calcitonin (salmon) (MIACALCIN/FORTICAL) nasal spray 1 spray    REVIEW OF SYSTEMS  : All other systems reviewed and negative except where noted in the History of Present Illness.   PHYSICAL EXAM: BP 120/76    Pulse 85    Ht 5\' 4"  (1.626 m)    Wt 156 lb 9.6 oz (71 kg)    SpO2 97%    BMI 26.88 kg/m  General: Well developed white female in no acute distress Head: Normocephalic and atraumatic Eyes:  Sclerae anicteric, conjunctiva pink. Ears: Normal auditory acuity Mouth:  No thrush or other issues noted. Lungs: Clear throughout to  auscultation; no W/R/R. Heart: Regular rate and rhythm; no M/R/G. Abdomen: Soft, non-distended.  BS present.  Epigastric TTP. Musculoskeletal: Symmetrical with no gross deformities  Skin: No lesions on visible extremities Extremities: No edema  Neurological: Alert oriented x 4, grossly non-focal Psychological:  Alert and cooperative. Normal mood and affect  ASSESSMENT AND PLAN: 85 year old female with complaints of atypical chest pain/epigastric abdominal pain/odynophagia: Has been present for the past 1-2 weeks.  Is on omeprazole 40 mg twice daily.  Also takes Carafate 4 times daily, but is in pill form.  I think she would benefit more from the suspension.  I have advised her on how to dissolve her pill and liquid and drink that 4 times daily.  She had Candida esophagitis on EGD in March 2021.  She does use ongoing steroid inhaler.  Is possible she could have recurrence of that.  No thrush seen on exam today.  We will treat with fluconazole 400 mg on day 1 and then 200 mg for 13 more days to see if that helps.  Prescription sent to pharmacy.   CC:  [name], MD

## 2020-07-08 NOTE — Progress Notes (Signed)
Agree with assessment and plan as outlined.  

## 2020-07-16 ENCOUNTER — Telehealth: Payer: Self-pay | Admitting: Gastroenterology

## 2020-07-16 NOTE — Telephone Encounter (Signed)
Noted! Thank you

## 2020-07-16 NOTE — Telephone Encounter (Signed)
Pt is wanting to inform the nurse that she is doing better but she is still experiencing some abdominal pain, pt would like to know what she needs to do.

## 2020-07-16 NOTE — Telephone Encounter (Signed)
The pt was asked to call and give an update.  She has a "few more pills to take" (fluconazole).  She has been crushing the Carafate as asked.  She says that she is better but not 100%.  Only some abd discomfort at this point.  She is going to finish the fluconazole and call back if she does not continue to improve.

## 2020-07-20 ENCOUNTER — Telehealth: Payer: Self-pay | Admitting: Internal Medicine

## 2020-07-20 ENCOUNTER — Encounter: Payer: Self-pay | Admitting: Adult Health

## 2020-07-20 ENCOUNTER — Ambulatory Visit (INDEPENDENT_AMBULATORY_CARE_PROVIDER_SITE_OTHER): Payer: PPO | Admitting: Adult Health

## 2020-07-20 ENCOUNTER — Other Ambulatory Visit: Payer: Self-pay

## 2020-07-20 DIAGNOSIS — K219 Gastro-esophageal reflux disease without esophagitis: Secondary | ICD-10-CM | POA: Diagnosis not present

## 2020-07-20 DIAGNOSIS — J4520 Mild intermittent asthma, uncomplicated: Secondary | ICD-10-CM

## 2020-07-20 MED ORDER — ALBUTEROL SULFATE HFA 108 (90 BASE) MCG/ACT IN AERS: 2.0000 | INHALATION_SPRAY | Freq: Four times a day (QID) | RESPIRATORY_TRACT | 2 refills | Status: DC | PRN

## 2020-07-20 MED ORDER — AZITHROMYCIN 250 MG PO TABS: ORAL_TABLET | ORAL | 0 refills | Status: AC

## 2020-07-20 MED ORDER — PREDNISONE 10 MG PO TABS: ORAL_TABLET | ORAL | 0 refills | Status: DC

## 2020-07-20 NOTE — Assessment & Plan Note (Signed)
Continue on GERD therapy. 

## 2020-07-20 NOTE — Telephone Encounter (Signed)
Spoke with the pt  She is c/o increased SOB and cough for 2 wks, worse over the past wk  She states coughing up green/brown sputum  She states PCP wanted to have her f/u with pulm  Denies f/c/s, aches, HA, sore throat  She has have covid vax x 3  OV with TP at 11 am- advised arrive 10:45 am and new address for office given

## 2020-07-20 NOTE — Assessment & Plan Note (Signed)
Asthma exacerbation.  Patient appears to have been doing well over the last 2 to 3 years up until this last 2 weeks.  Appears to be having an asthmatic bronchitic exacerbation. We will treat with a short course of antibiotics and steroids.  Have a close follow-up with PFTs in the next 6 weeks. For now can continue on triple therapy.  Inhaler oral care discussed   Plan  Patient Instructions  Zpack take as directed  Mucinex DM liquid Twice daily  As needed  Cough/congestion  Prednisone taper over next week .  Albuterol inhaler As needed   Continue on BREZTRI 2 puffs Twice daily , brush/rinse/gargle after use.  Follow up with Dr. Melvyn Novas  In 6-8 weeks with PFTs and As needed  Please contact office for sooner follow up if symptoms do not improve or worsen or seek emergency care

## 2020-07-20 NOTE — Progress Notes (Signed)
@Patient  ID: [name] [name], female    DOB: 18-Jul-1935, 85 y.o.   MRN: [id number]  Chief Complaint  Patient presents with  . Acute Visit    Referring provider: [name], MD  HPI: 85 year old female never smoker followed for Asthma  Medical history significant for large hiatal hernia, iron deficiency anemia, hypertension  TEST/EVENTS :  Spirometry May 2019 was normal with FEV1 at 90%, no airflow obstruction or restriction  Chest x-ray December 2021 showed linear scarring/atelectasis right base.  CT chest December 2020 atelectasis/scarring right greater than left.  07/20/2020 Follow up: Asthma  Patient returns for a follow-up visit.  Patient was last seen in May 2019.  Previously treated for asthma.  Patient says she has been doing very well and following with her primary care provider.  Has been on [facility] and having no significant difficulties until 2 weeks ago.  Started having increased cough wheezing and shortness of breath.  Was changed to [facility] but has continued to have ongoing cough congestion and now over the last week coughing up thick green mucus.  Needs a refill of her albuterol inhaler. She took a Covid test 1 week ago and was negative.  She is fully vaccinated including her booster for COVID-19 Patient is also being treated by GI for possible candidiasis esophagitis-with Diflucan.  She has 1 day left.  She says her symptoms have improved. Patient was last seen in May 2019.  Doing well since last visit until 2 weeks ago  She does have significant GERD.  And is on omeprazole twice daily and Carafate 4 times daily.   Allergies  Allergen Reactions  . Ace Inhibitors Cough    Per Dr Melvyn Novas pulmonology 2013  . Aspartame Diarrhea and Nausea And Vomiting  . Atorvastatin Other (See Comments)    Muscle aches  . Biaxin [Clarithromycin] Other (See Comments)    Unknown reaction : PATIENT CAN TOLERATE Z-PAK.  Is written on patient's paper chart.  [name] [Roflumilast]  Nausea And Vomiting  . Erythromycin Other (See Comments)    Unknown reaction: PATIENT CAN TOLERATE Z-PAK.  It is written on patient's paper chart.  . Levofloxacin Nausea And Vomiting    Immunization History  Administered Date(s) Administered  . DT (Pediatric) 05/06/2014  . Influenza Split 02/07/2011  . Influenza Whole 02/09/2009, 01/19/2010, 02/13/2012  . Influenza, High Dose Seasonal PF 03/22/2013, 01/15/2014, 02/27/2015, 01/14/2016, 01/18/2017, 02/14/2018, 02/14/2019, 03/16/2020  . PFIZER(Purple Top)SARS-COV-2 Vaccination 05/25/2019, 06/15/2019, 03/24/2020  . Pneumococcal Conjugate-13 05/06/2014  . Pneumococcal Polysaccharide-23 02/09/2011  . Pneumococcal-Unspecified 02/06/2010, 02/09/2012  . Td 05/10/2003    Past Medical History:  Diagnosis Date  . Anxiety    takes Xanax daily as needed  . Asthma    Albuterol daily as needed  . Blood transfusion without reported diagnosis   . CAD (coronary artery disease)    LHC 2003 with 40% pLAD, 30% mLAD, 100% D1 (moderate vessel), 100%  D2 (small vessel), 95% mid small OM2.  Pt had PTCA to D1;  D2 and OM2 small vessels and tx medically;  Last Myoview (2012): anterior infarct seen with scar, no ischemia. EF normal. Med Rx // Myoview (12/14):  Low risk; ant scar w/ peri-infarct ischemia; EF 55% with ant AK // Myoview 5/16: EF 55-65, ant, apical scar, no ischemia   . Cataract   . Chronic back pain    HNP, DDD, spinal stenosis, vertebral compression fractures.   . Chronic nausea    takes Zofran daily as needed.  normal gastric  emptying study 03/2013  . CKD (chronic kidney disease), stage III ([place]) 09/27/2017  . Constipation    felt to be functional. takes Senokot and Miralax daily.  treated with Linzess in past.   . DIVERTICULOSIS, COLON 08/28/2008   Qualifier: Diagnosis of  By: [name], [name], [name]    . Elevated LFTs 2015   probably med induced DILI, from Augmentin vs Pravastatin  . GERD (gastroesophageal reflux disease)    hx of esophageal  stricture   . Hiatal hernia    Paraesophageal hernias. s/p fundoplications in 3016 and 04/2013  . History of bronchitis several of yrs ago  . History of colon polyps 03/2009, 03/2011   adenomatous colon polyps, no high grade dysplasia.   [name] History of vertigo    no meds  . Hyperlipidemia    takes Pravastatin daily  . Hypertension    takes Amlodipine,Metoprolol,and Losartan daily  . Nausea 03/2016  . Osteoporosis   . PONV (postoperative nausea and vomiting)    yrs ago  . Slow urinary stream    takes Rapaflo daily  . TIA (transient ischemic attack) 1995   no residual effects noted    Tobacco History: Social History   Tobacco Use  Smoking Status Never Smoker  Smokeless Tobacco Never Used   Counseling given: Not Answered   Outpatient Medications Prior to Visit  Medication Sig Dispense Refill  . ALPRAZolam (XANAX) 0.5 MG tablet Take 0.5-1 tablets (0.25-0.5 mg total) by mouth daily as needed for anxiety. Take 1/2 tablet 1- 2 x /day ONLY if needed for Anxiety Attack &  limit to 5 days /week to avoid Addiction - DO NOT take with Hydrocodone or Codeine 10 tablet 0  . amLODipine (NORVASC) 10 MG tablet Take 1 tablet Daily for BP & Heart 90 tablet 3  . aspirin EC 81 MG tablet Take 81 mg by mouth at bedtime.     . Budeson-Glycopyrrol-Formoterol (BREZTRI AEROSPHERE) 160-9-4.8 MCG/ACT AERO Inhale 2 puffs into the lungs in the morning and at bedtime.    . Cholecalciferol (VITAMIN D3) 5000 units CAPS Take 5,000 Units by mouth 2 (two) times daily.     . cyclobenzaprine (FLEXERIL) 10 MG tablet Take  1 tablet  3 x /day  as needed for back pain /muscle spasm 90 tablet 0  . denosumab (PROLIA) 60 MG/ML SOSY injection Inject 60 mg into the skin every 6 (six) months. 1 mL 0  . diclofenac sodium (VOLTAREN) 1 % GEL Apply 2 g topically 4 (four) times daily. (Patient taking differently: Apply 2 g topically 4 (four) times daily as needed (pain).) 1 Tube 1  . dicyclomine (BENTYL) 20 MG tablet Take 1 tablet  (20 mg total) by mouth 3 (three) times daily as needed for spasms. Take 1 tablet 3 x  /day before meals for Cramping, Nausea or Diarhea 10 tablet 0  . DULoxetine (CYMBALTA) 30 MG capsule TAKE 1 CAPSULE BY MOUTH ONCE DAILY, FOR MOOD AND CHRONIC PAIN 90 capsule 1  . fluconazole (DIFLUCAN) 200 MG tablet Take 2 tablets (400 mg total) by mouth daily for 1 day, THEN 1 tablet (200 mg total) daily for 13 days. 15 tablet 0  . gabapentin (NEURONTIN) 300 MG capsule Take 1 capsule (300 mg total) by mouth 4 (four) times daily. 20 capsule 0  . ketoconazole (NIZORAL) 2 % cream Apply 1 application topically 2 x /day (Patient taking differently: Apply 1 application topically 2 (two) times daily. Apply 1 application topically 2 x /day) 60 g 3  .  Magnesium 400 MG TABS Take 400 mg by mouth 2 (two) times daily.     . meclizine (ANTIVERT) 25 MG tablet Take 1/2 to 1 tablet 2 to 3 x /day ONLY if needed  For Dizziness / Vertigo (Patient taking differently: Take 12.5-25 mg by mouth 3 (three) times daily as needed for dizziness. Take 1/2 to 1 tablet 2 to 3 x /day ONLY if needed  For Dizziness / Vertigo) 270 tablet 1  . metoprolol tartrate (LOPRESSOR) 50 MG tablet TAKE 1 TABLET BY MOUTH TWICE DAILY FOR BLOOD PRESSURE AND FOR HEART 180 tablet 1  . Multiple Vitamins-Minerals (MULTIVITAMIN WITH MINERALS) tablet Take 1 tablet by mouth daily.    . nitroGLYCERIN (NITROSTAT) 0.4 MG SL tablet Place 1 tablet (0.4 mg total) under the tongue every 5 (five) minutes as needed for chest pain. 90 tablet 3  . omeprazole (PRILOSEC) 40 MG capsule TAKE 1 CAPSULE BY MOUTH TWICE DAILY FOR  INDIGESTION,  HEARTBURN  AND  NAUSEA 180 capsule 0  . prochlorperazine (COMPAZINE) 5 MG tablet Take 1 tablet (5 mg total) by mouth every 6 (six) hours as needed for refractory nausea / vomiting. 10 tablet 0  . promethazine (PHENERGAN) 25 MG tablet Take     1 tablet       every 4 hours       ONLY      if Needed for Nausea & Vomitting 50 tablet 0  . rOPINIRole  (REQUIP) 2 MG tablet Take 1 tablet (2 mg total) by mouth 3 (three) times daily. 90 tablet 0  . senna-docusate (SENOKOT-S) 8.6-50 MG tablet Take 2 tablets by mouth 2 (two) times daily. 60 tablet 1  . sucralfate (CARAFATE) 1 g tablet TAKE 1 TABLET BY MOUTH 4 TIMES DAILY BEFORE  MEALS  AND  AT  BEDTIME  FOR  INDIGESTION  AND  HEARTBURN 360 tablet 0  . trospium (SANCTURA) 20 MG tablet Take 20 mg by mouth 2 (two) times daily.    . vitamin B-12 (CYANOCOBALAMIN) 1000 MCG tablet Take 1,000 mcg by mouth daily.     Facility-Administered Medications Prior to Visit  Medication Dose Route Frequency Provider Last Rate Last Admin  . calcitonin (salmon) (MIACALCIN/FORTICAL) nasal spray 1 spray  1 spray Alternating Nares Daily Jessy Oto, MD         Review of Systems:   Constitutional:   No  weight loss, night sweats,  Fevers, chills,  +fatigue, or  lassitude.  HEENT:   No headaches,  Difficulty swallowing,  Tooth/dental problems, or  Sore throat,                No sneezing, itching, ear ache,  +nasal congestion, post nasal drip,   CV:  No chest pain,  Orthopnea, PND, swelling in lower extremities, anasarca, dizziness, palpitations, syncope.   GI  No  , abdominal pain, nausea, vomiting, diarrhea, change in bowel habits, loss of appetite, bloody stools.   Resp:    No chest wall deformity  Skin: no rash or lesions.  GU: no dysuria, change in color of urine, no urgency or frequency.  No flank pain, no hematuria   MS:  No joint pain or swelling.  No decreased range of motion.  No back pain.    Physical Exam  BP (!) 116/56 (BP Location: Left Arm, Patient Position: Sitting, Cuff Size: Normal)   Pulse (!) 110   Temp 97.6 F (36.4 C) (Temporal)   Ht 5\' 4"  (1.626 m)   Wt  154 lb 3.2 oz (69.9 kg)   SpO2 95%   BMI 26.47 kg/m   GEN: A/Ox3; pleasant , NAD    HEENT:  [place]/AT,   NOSE-clear, THROAT-clear, no lesions, no postnasal drip or exudate noted.   NECK:  Supple w/ fair ROM; no JVD; normal  carotid impulses w/o bruits; no thyromegaly or nodules palpated; no lymphadenopathy.    RESP  Clear  P & A; w/o, wheezes/ rales/ or rhonchi. no accessory muscle use, no dullness to percussion,.  Speaks in full sentences.  CARD:  RRR, no m/r/g, no peripheral edema, pulses intact, no cyanosis or clubbing.  GI:   Soft & nt; nml bowel sounds; no organomegaly or masses detected.   Musco: Warm bil, no deformities or joint swelling noted.   Neuro: alert, no focal deficits noted.    Skin: Warm, no lesions or rashes    Lab Results:  CBC   Imaging: No results found.    No flowsheet data found.  No results found for: NITRICOXIDE      Assessment & Plan:   Mild intermittent asthma without complication Asthma exacerbation.  Patient appears to have been doing well over the last 2 to 3 years up until this last 2 weeks.  Appears to be having an asthmatic bronchitic exacerbation. We will treat with a short course of antibiotics and steroids.  Have a close follow-up with PFTs in the next 6 weeks. For now can continue on triple therapy.  Inhaler oral care discussed   Plan  Patient Instructions  Zpack take as directed  Mucinex DM liquid Twice daily  As needed  Cough/congestion  Prednisone taper over next week .  Albuterol inhaler As needed   Continue on BREZTRI 2 puffs Twice daily , brush/rinse/gargle after use.  Follow up with Dr. Melvyn Novas  In 6-8 weeks with PFTs and As needed  Please contact office for sooner follow up if symptoms do not improve or worsen or seek emergency care        Gastroesophageal reflux disease Continue on GERD therapy.     Rexene Edison, NP 07/20/2020

## 2020-07-20 NOTE — Patient Instructions (Addendum)
Zpack take as directed  Mucinex DM liquid Twice daily  As needed  Cough/congestion  Prednisone taper over next week .  Albuterol inhaler As needed   Continue on BREZTRI 2 puffs Twice daily , brush/rinse/gargle after use.  Follow up with Dr. Melvyn Novas  In 6-8 weeks with PFTs and As needed  Please contact office for sooner follow up if symptoms do not improve or worsen or seek emergency care

## 2020-07-21 ENCOUNTER — Ambulatory Visit: Payer: PPO | Admitting: Adult Health Nurse Practitioner

## 2020-07-21 DIAGNOSIS — Z860101 Personal history of adenomatous and serrated colon polyps: Secondary | ICD-10-CM | POA: Insufficient documentation

## 2020-07-21 DIAGNOSIS — Z86010 Personal history of colonic polyps: Secondary | ICD-10-CM | POA: Insufficient documentation

## 2020-07-21 NOTE — Progress Notes (Unsigned)
MEDICARE ANNUAL WELLNESS VISIT AND FOLLOW UP  Assessment:   Medicare annual wellness visit 1 year Mammogram and DEXA ordered to schedule  Atherosclerosis of aorta ([place]) Per numerous CTs Control blood pressure, cholesterol, glucose, increase exercise.   Essential hypertension - continue medications, DASH diet, exercise and monitor at home. Call if greater than 130/80.  - CBC with Differential/Platelet - CMP/GFR - TSH  Prediabetes Discussed general issues about diabetes pathophysiology and management., Educational material distributed., Suggested low cholesterol diet., Encouraged aerobic exercise., Discussed foot care., Reminded to get yearly retinal exam. - Hemoglobin A1c q58m   Mixed hyperlipidemia -continue medications, check lipids, decrease fatty foods, increase activity.  - Lipid panel  Coronary artery disease due to lipid rich plaque Control blood pressure, cholesterol, glucose, increase exercise.  - Lipid panel - Hemoglobin A1c  Recurrent major depressive disorder, in partial remission (HCC) Continue medications  Lifestyle discussed: diet/exerise, sleep hygiene, stress management, hydration  Incarcerated recurrent paraesophageal hiatal hernia s/p lap redo repair [id number] Improved, continue PPI   Asthma, unspecified asthma severity, uncomplicated Continue inhalers Go to the ER if any chest pain, shortness of breath, nausea, dizziness, severe HA, changes vision/speech  Chronic bronchitis (HCC) Continue inhalers, follows with pulm  Osteoporosis Continue prolia via Dr. Estanislado Pandy, continue Vit D and Ca, weight bearing exercises   Herniated lumbar intervertebral disc Continue pain management  Vitamin D deficiency - Vit D  25 hydroxy (rtn osteoporosis monitoring)  MGUS (monoclonal gammopathy of unknown significance) Continue to monitor; had negative biopsy   Medication management - Magnesium  Chronic pain syndrome Continue pain management    Anxiety continue medications, stress management techniques discussed, increase water, good sleep hygiene discussed, increase exercise, and increase veggies.    Gastroesophageal reflux disease, esophagitis presence not specified Continue PPI/H2 blocker, diet discussed   ESOPHAGEAL STRICTURE Improved on last, follow up GI as recommended; continue PPI   Constipation, chronic Continue on senokot, GI following  Spondylolisthesis, lumbar region Followed by spine specialist and pain management.   DDD (degenerative disc disease), lumbar Continue follow up pain management  Spinal stenosis, lumbar region, with neurogenic claudication Continue follow up pain management  History of lumbar spinal fusion Continue follow up pain management  Senile purpura ([place]) Secondary to age and aspirin; reassured; protect skin   Over 30 minutes of exam, counseling, chart review, and critical decision making was performed  Future Appointments  Date Time Provider [place]  08/31/2020 10:05 AM [facility]-SCREENING [facility] None  09/02/2020 11:00 AM [facility]-PFT RM [facility] None  09/02/2020 12:00 PM [name], MD [facility] None  09/17/2020 10:15 AM [name], MD [facility] None  10/15/2020 10:00 AM [name], NP [facility] None  07/22/2021 10:30 AM [name], NP [facility] None     Plan:   During the course of the visit the patient was educated and counseled about appropriate screening and preventive services including:    Pneumococcal vaccine   Influenza vaccine  Td vaccine  Prevnar 13  Screening electrocardiogram  Screening mammography  Bone densitometry screening  Colorectal cancer screening  Diabetes screening  Glaucoma screening  Nutrition counseling   Advanced directives: given info/requested copies   Subjective:   [name] [name] is a 85 y.o. female who presents for Medicare Annual Wellness Visit and 3 month follow up on hypertension, prediabetes,  hyperlipidemia, vitamin D def.   Patient has a chronic pain syndrome and is followed by Dr Nicholaus Bloom (pain management) and Dr Lorin Mercy.  Hx of numerous lumbar surgeries, disc and fusions historically,  spinal stenosis, several thoracic compression fractures with kyphoplasty. She reported mechanical fall on concrete slab Aug 2021 and had pelvic fracture, felt routine healing by Dr. Lorin Mercy. She completed PT, denies other falls or fractures. She is followed by Dr. Estanislado Pandy for prolia injections; hx/o Osteoporosis with T-2.6 at last DexaBMD on 11/2018 (improved from -2.8 in 2018). She expressed preference to avoid opioids and is prescribed gabapentin/   Follows closely with GI; recently seen 07/08/2020 for epigastric pain/nausea; hx of fundoplication; she was advised to continue omeprazole 20 mg BID, switch carafate to liquid formulation and intiated on course of diflucan for possible recurrent candida esophagitis. She also has hx of adenomatous polyp of colon per Dr. Havery Moros, due for 3 year follow up that was recommended.   She followes with Dr. Melvyn Novas for COPD/asthma, on breztri, reports was put on steroid taper, planning PFTs.   She has history of MGUS with negative BX.   she has a diagnosis of anxiety./depression and is currently on cymbalta 30 mg daily, xanax 0.25-0.5 mg TID PRN, reports symptoms are well controlled on current regimen. she reports she last took 1-2 months ago, uses rarely.   BMI is Body mass index is 26.95 kg/m., she has not been working on diet and exercise. Her weight is up, no edema. She admits to eating more.  Wt Readings from Last 3 Encounters:  07/22/20 157 lb (71.2 kg)  07/20/20 154 lb 3.2 oz (69.9 kg)  07/08/20 156 lb 9.6 oz (71 kg)   She also has history of PTCA/stent placed in 2003 and low risk myoview in 2014.  She has aortic atherosclerosis per numerous CTs, e.g 04/2019.   Her blood pressure has been controlled at home, today their BP is BP: 128/68  She does not  work out due to chronic pain.   She is on cholesterol medication (pravastatin 40 mg daily) and denies myalgias. Her cholesterol is not at goal of LDL <70. The cholesterol last visit was:   Lab Results  Component Value Date   CHOL 162 04/20/2020   HDL 66 04/20/2020   LDLCALC 76 04/20/2020   TRIG 116 04/20/2020   CHOLHDL 2.5 04/20/2020    She has been working on diet and exercise for prediabetes (x 2010), and denies polydipsia, polyuria and visual disturbances. Last A1C in the office was:  Lab Results  Component Value Date   HGBA1C 5.9 (H) 04/20/2020   Last GFR  Lab Results  Component Value Date   GFRNONAA 75 04/20/2020   Patient is on Vitamin D supplement and at goal at last check:  Lab Results  Component Value Date   VD25OH 92 10/16/2019      Medication Review Current Outpatient Medications on File Prior to Visit  Medication Sig Dispense Refill  . albuterol (VENTOLIN HFA) 108 (90 Base) MCG/ACT inhaler Inhale 2 puffs into the lungs every 6 (six) hours as needed for wheezing or shortness of breath. 8 g 2  . ALPRAZolam (XANAX) 0.5 MG tablet Take 0.5-1 tablets (0.25-0.5 mg total) by mouth daily as needed for anxiety. Take 1/2 tablet 1- 2 x /day ONLY if needed for Anxiety Attack &  limit to 5 days /week to avoid Addiction - DO NOT take with Hydrocodone or Codeine 10 tablet 0  . amLODipine (NORVASC) 10 MG tablet Take 1 tablet Daily for BP & Heart 90 tablet 3  . aspirin EC 81 MG tablet Take 81 mg by mouth at bedtime.     [name] azithromycin (ZITHROMAX Z-PAK) 250  MG tablet Take 2 tablets (500 mg) on  Day 1,  followed by 1 tablet (250 mg) once daily on Days 2 through 5. 6 each 0  . Budeson-Glycopyrrol-Formoterol (BREZTRI AEROSPHERE) 160-9-4.8 MCG/ACT AERO Inhale 2 puffs into the lungs in the morning and at bedtime.    . Cholecalciferol (VITAMIN D3) 5000 units CAPS Take 5,000 Units by mouth 2 (two) times daily.     . cyclobenzaprine (FLEXERIL) 10 MG tablet Take  1 tablet  3 x /day  as needed  for back pain /muscle spasm 90 tablet 0  . denosumab (PROLIA) 60 MG/ML SOSY injection Inject 60 mg into the skin every 6 (six) months. 1 mL 0  . diclofenac sodium (VOLTAREN) 1 % GEL Apply 2 g topically 4 (four) times daily. (Patient taking differently: Apply 2 g topically 4 (four) times daily as needed (pain).) 1 Tube 1  . dicyclomine (BENTYL) 20 MG tablet Take 1 tablet (20 mg total) by mouth 3 (three) times daily as needed for spasms. Take 1 tablet 3 x  /day before meals for Cramping, Nausea or Diarhea 10 tablet 0  . DULoxetine (CYMBALTA) 30 MG capsule TAKE 1 CAPSULE BY MOUTH ONCE DAILY, FOR MOOD AND CHRONIC PAIN 90 capsule 1  . fluconazole (DIFLUCAN) 200 MG tablet Take 2 tablets (400 mg total) by mouth daily for 1 day, THEN 1 tablet (200 mg total) daily for 13 days. 15 tablet 0  . gabapentin (NEURONTIN) 300 MG capsule Take 1 capsule (300 mg total) by mouth 4 (four) times daily. 20 capsule 0  . ketoconazole (NIZORAL) 2 % cream Apply 1 application topically 2 x /day (Patient taking differently: Apply 1 application topically 2 (two) times daily. Apply 1 application topically 2 x /day) 60 g 3  . Magnesium 400 MG TABS Take 400 mg by mouth 2 (two) times daily.     . meclizine (ANTIVERT) 25 MG tablet Take 1/2 to 1 tablet 2 to 3 x /day ONLY if needed  For Dizziness / Vertigo (Patient taking differently: Take 12.5-25 mg by mouth 3 (three) times daily as needed for dizziness. Take 1/2 to 1 tablet 2 to 3 x /day ONLY if needed  For Dizziness / Vertigo) 270 tablet 1  . metoprolol tartrate (LOPRESSOR) 50 MG tablet TAKE 1 TABLET BY MOUTH TWICE DAILY FOR BLOOD PRESSURE AND FOR HEART 180 tablet 1  . Multiple Vitamins-Minerals (MULTIVITAMIN WITH MINERALS) tablet Take 1 tablet by mouth daily.    . nitroGLYCERIN (NITROSTAT) 0.4 MG SL tablet Place 1 tablet (0.4 mg total) under the tongue every 5 (five) minutes as needed for chest pain. 90 tablet 3  . omeprazole (PRILOSEC) 40 MG capsule TAKE 1 CAPSULE BY MOUTH TWICE DAILY  FOR  INDIGESTION,  HEARTBURN  AND  NAUSEA 180 capsule 0  . predniSONE (DELTASONE) 10 MG tablet 4 tabs for 2 days, then 3 tabs for 2 days, 2 tabs for 2 days, then 1 tab for 2 days, then stop 20 tablet 0  . prochlorperazine (COMPAZINE) 5 MG tablet Take 1 tablet (5 mg total) by mouth every 6 (six) hours as needed for refractory nausea / vomiting. 10 tablet 0  . promethazine (PHENERGAN) 25 MG tablet Take     1 tablet       every 4 hours       ONLY      if Needed for Nausea & Vomitting 50 tablet 0  . rOPINIRole (REQUIP) 2 MG tablet Take 1 tablet (2 mg total) by mouth  3 (three) times daily. 90 tablet 0  . senna-docusate (SENOKOT-S) 8.6-50 MG tablet Take 2 tablets by mouth 2 (two) times daily. 60 tablet 1  . sucralfate (CARAFATE) 1 g tablet TAKE 1 TABLET BY MOUTH 4 TIMES DAILY BEFORE  MEALS  AND  AT  BEDTIME  FOR  INDIGESTION  AND  HEARTBURN 360 tablet 0  . trospium (SANCTURA) 20 MG tablet Take 20 mg by mouth 2 (two) times daily.    . vitamin B-12 (CYANOCOBALAMIN) 1000 MCG tablet Take 1,000 mcg by mouth daily.     Current Facility-Administered Medications on File Prior to Visit  Medication Dose Route Frequency Provider Last Rate Last Admin  . calcitonin (salmon) (MIACALCIN/FORTICAL) nasal spray 1 spray  1 spray Alternating Nares Daily Jessy Oto, MD        Current Problems (verified) Patient Active Problem List   Diagnosis Date Noted  . History of adenomatous polyp of colon 07/21/2020  . Atypical chest pain 07/08/2020  . History of fundoplication   . Epigastric pain 07/09/2019  . Senile purpura ([place]) 07/05/2019  . Abnormal glucose 03/01/2018  . FHx: heart disease 03/01/2018  . CKD (chronic kidney disease) stage 2, GFR 60-89 ml/min 09/27/2017  . T12 compression fracture ([place]) 09/26/2017  . Chronic bronchitis ([place]) 08/09/2017  . Atherosclerosis of aorta ([place]) 04/05/2017  . Odynophagia   . Spinal stenosis, lumbar region, with neurogenic claudication 05/29/2015  . DDD (degenerative disc  disease), lumbar 04/21/2015  . Hyperlipidemia, mixed 11/24/2014  . Gastroesophageal reflux disease 11/24/2014  . Depression, major, in partial remission ([place]) 09/26/2014  . Chronic pain syndrome 09/26/2014  . Coronary artery disease involving native heart without angina pectoris   . Osteoporosis 02/10/2014  . Prediabetes 08/14/2013  . Vitamin D deficiency 08/14/2013  . MGUS (monoclonal gammopathy of unknown significance) 03/05/2013  . ESOPHAGEAL STRICTURE 08/28/2008  . Mild intermittent asthma without complication 75/64/3329  . Incarcerated recurrent paraesophageal hiatal hernia s/p lap redo repair [id number] 01/31/2008  . Essential hypertension 07/25/2007    Screening Tests Immunization History  Administered Date(s) Administered  . DT (Pediatric) 05/06/2014  . Influenza Split 02/07/2011  . Influenza Whole 02/09/2009, 01/19/2010, 02/13/2012  . Influenza, High Dose Seasonal PF 03/22/2013, 01/15/2014, 02/27/2015, 01/14/2016, 01/18/2017, 02/14/2018, 02/14/2019, 03/16/2020  . PFIZER(Purple Top)SARS-COV-2 Vaccination 05/25/2019, 06/15/2019, 03/24/2020  . Pneumococcal Conjugate-13 05/06/2014  . Pneumococcal Polysaccharide-23 02/09/2011  . Pneumococcal-Unspecified 02/06/2010, 02/09/2012  . Td 05/10/2003    Preventative care: Last colonoscopy: 05/2017, adenomatous polyp, Dr. Havery Moros, 3 year recall DUE - referral placed EGD 07/2019, candida esophagitis Last mammogram: 11/2016 DUE  Last pap smear/pelvic exam: remote,declines   DEXA 11/2018 L fem T -2.6, continue prolia per Dr. Estanislado Pandy   Myocardial perfusion 09/2014, previous MI, low risk study Echo 08/2014, EF 55-60%   Prior vaccinations: TD or Tdap: 04/2014  Influenza: 03/2020  Pneumococcal: 2012 Prevnar13:  2015 Shingles/Zostavax: declines due to cost Covid 19: 3/3, 2021, pfizer  Names of Other Physician/Practitioners you currently use: 1. [facility]- here for primary care 2. Dr.  Einar Gip, eye doctor, last visit 2022 3. No dentist, dentures.    Patient Care Team: [name], MD as PCP - General (Internal Medicine) Jerline Pain, MD as PCP - Cardiology (Cardiology) Inda Castle, MD (Inactive) as Consulting Physician (Gastroenterology) Rigoberto Noel, MD as Consulting Physician (Pulmonary Disease) Larey Dresser, MD as Consulting Physician (Cardiology) Michael Boston, MD as Consulting Physician (General Surgery) Jessy Oto, MD as Consulting Physician (Orthopedic Surgery) Cruzita Lederer,  Salena Saner, MD as Consulting Physician (Endocrinology) Curt Bears, MD as Consulting Physician (Oncology)  Allergies Allergies  Allergen Reactions  . Ace Inhibitors Cough    Per Dr Melvyn Novas pulmonology 2013  . Aspartame Diarrhea and Nausea And Vomiting  . Atorvastatin Other (See Comments)    Muscle aches  . Biaxin [Clarithromycin] Other (See Comments)    Unknown reaction : PATIENT CAN TOLERATE Z-PAK.  Is written on patient's paper chart.  [name] [Roflumilast] Nausea And Vomiting  . Erythromycin Other (See Comments)    Unknown reaction: PATIENT CAN TOLERATE Z-PAK.  It is written on patient's paper chart.  . Levofloxacin Nausea And Vomiting    SURGICAL HISTORY She  has a past surgical history that includes Back surgery; Bladder repair; Hemorrhoid surgery; Cholecystectomy; Appendectomy; Total abdominal hysterectomy (1975); Eye surgery (11/2000); Hiatal hernia repair (06/03/2011); Laparoscopic Nissen fundoplication (11/05/5282); Lumbar laminectomy/decompression microdiscectomy (Right, 06/26/2012); Esophagogastroduodenoscopy (N/A, 03/15/2013); Esophageal manometry (N/A, 03/25/2013); Hiatal hernia repair (N/A, 04/24/2013); Insertion of mesh (N/A, 04/24/2013); Laparoscopic lysis of adhesions (N/A, 04/24/2013); Fixation kyphoplasty lumbar spine (X 2); Coronary angioplasty (11/16/2001); Lumbar laminectomy/decompression microdiscectomy (N/A, 05/29/2015); Lumbar fusion (12/07/2015);  Esophagogastroduodenoscopy (N/A, 12/25/2015); IR KYPHO THORACIC WITH BONE BIOPSY (09/29/2017); IR KYPHO THORACIC WITH BONE BIOPSY (11/03/2017); Upper gastrointestinal endoscopy; IR KYPHO THORACIC WITH BONE BIOPSY (02/14/2019); and IR KYPHO THORACIC WITH BONE BIOPSY (04/02/2019). FAMILY HISTORY Her family history includes Brain cancer in her brother; Colon cancer (age of onset: 41) in her mother; Heart attack (age of onset: 67) in her father; Heart disease in her brother and brother; Kidney disease in her daughter. SOCIAL HISTORY She  reports that she has never smoked. She has never used smokeless tobacco. She reports that she does not drink alcohol and does not use drugs.  MEDICARE WELLNESS OBJECTIVES: Physical activity: Current Exercise Habits: The patient does not participate in regular exercise at present, Type of exercise: strength training/weights, Exercise limited by: orthopedic condition(s) Cardiac risk factors: Cardiac Risk Factors include: advanced age (>19men, >6 women);dyslipidemia;hypertension;sedentary lifestyle;family history of premature cardiovascular disease Depression/mood screen:   Depression screen [facility] 2/9 07/22/2020  Decreased Interest 0  Down, Depressed, Hopeless 0  PHQ - 2 Score 0  Altered sleeping 0  Tired, decreased energy 0  Change in appetite 0  Feeling bad or failure about yourself  0  Trouble concentrating 0  Moving slowly or fidgety/restless 0  Suicidal thoughts 0  PHQ-9 Score 0  Difficult doing work/chores Not difficult at all  Some recent data might be hidden    ADLs:  In your present state of health, do you have any difficulty performing the following activities: 07/22/2020 01/20/2020  Hearing? N N  Vision? N N  Difficulty concentrating or making decisions? N N  Walking or climbing stairs? N Y  Dressing or bathing? N Y  Doing errands, shopping? N Y  Comment husband drives -  Some recent data might be hidden     Cognitive Testing  Alert? Yes  Normal  Appearance?Yes  Oriented to person? Yes  Place? Yes   Time? Yes  Recall of three objects?  Yes  Can perform simple calculations? Yes  Displays appropriate judgment?Yes  Can read the correct time from a watch face?Yes  EOL planning: Does Patient Have a Medical Advance Directive?: Yes Type of Advance Directive: [place] will Does patient want to make changes to medical advance directive?: No - Patient declined Copy of [place] in Chart?: Yes - validated most recent copy scanned in chart (See row  information)   Objective:   Today's Vitals   07/22/20 1043  BP: 128/68  Pulse: 95  Temp: (!) 97.5 F (36.4 C)  SpO2: 96%  Weight: 157 lb (71.2 kg)   Body mass index is 26.95 kg/m.  General appearance: alert, no distress, WD/WN,  female HEENT: normocephalic, sclerae anicteric, TMs pearly, nares patent, no discharge or erythema, pharynx normal Oral cavity: MMM, no lesions Neck: supple, no lymphadenopathy, no thyromegaly, no masses Heart: RRR, normal S1, S2, no murmurs Lungs: CTA bilaterally, no wheezes, rhonchi, or rales Abdomen: +bs, rounded, non tender, no palpable masses, no hepatomegaly, no splenomegaly Musculoskeletal: nontender, no swelling, no obvious deformity Extremities: no edema, no cyanosis, no clubbing. She does have scattered small ecchymoses Pulses: 2+ symmetric, upper and lower extremities, normal cap refill Neurological: alert, oriented x 3, CN2-12 intact, strength 4/5 upper extremities and lower extremities, DTRs 2+ throughout, no cerebellar signs, gait antaglic with cane Psychiatric: normal affect, behavior normal, pleasant    Medicare Attestation I have personally reviewed: The patient's medical and social history Their use of alcohol, tobacco or illicit drugs Their current medications and supplements The patient's functional ability including ADLs,fall risks, home safety risks, cognitive, and hearing and visual  impairment Diet and physical activities Evidence for depression or mood disorders  The patient's weight, height, BMI, and visual acuity have been recorded in the chart.  I have made referrals, counseling, and provided education to the patient based on review of the above and I have provided the patient with a written personalized care plan for preventive services.     Izora Ribas, NP   07/22/2020

## 2020-07-22 ENCOUNTER — Ambulatory Visit (INDEPENDENT_AMBULATORY_CARE_PROVIDER_SITE_OTHER): Payer: PPO | Admitting: Adult Health

## 2020-07-22 ENCOUNTER — Other Ambulatory Visit: Payer: Self-pay

## 2020-07-22 ENCOUNTER — Encounter: Payer: Self-pay | Admitting: Adult Health

## 2020-07-22 VITALS — BP 128/68 | HR 95 | Temp 97.5°F | Wt 157.0 lb

## 2020-07-22 DIAGNOSIS — I2510 Atherosclerotic heart disease of native coronary artery without angina pectoris: Secondary | ICD-10-CM | POA: Diagnosis not present

## 2020-07-22 DIAGNOSIS — M5136 Other intervertebral disc degeneration, lumbar region: Secondary | ICD-10-CM | POA: Diagnosis not present

## 2020-07-22 DIAGNOSIS — K440 Diaphragmatic hernia with obstruction, without gangrene: Secondary | ICD-10-CM

## 2020-07-22 DIAGNOSIS — K219 Gastro-esophageal reflux disease without esophagitis: Secondary | ICD-10-CM

## 2020-07-22 DIAGNOSIS — F3341 Major depressive disorder, recurrent, in partial remission: Secondary | ICD-10-CM

## 2020-07-22 DIAGNOSIS — M48062 Spinal stenosis, lumbar region with neurogenic claudication: Secondary | ICD-10-CM

## 2020-07-22 DIAGNOSIS — R7303 Prediabetes: Secondary | ICD-10-CM | POA: Diagnosis not present

## 2020-07-22 DIAGNOSIS — Z0001 Encounter for general adult medical examination with abnormal findings: Secondary | ICD-10-CM | POA: Diagnosis not present

## 2020-07-22 DIAGNOSIS — I10 Essential (primary) hypertension: Secondary | ICD-10-CM

## 2020-07-22 DIAGNOSIS — M810 Age-related osteoporosis without current pathological fracture: Secondary | ICD-10-CM

## 2020-07-22 DIAGNOSIS — R7309 Other abnormal glucose: Secondary | ICD-10-CM

## 2020-07-22 DIAGNOSIS — I700 Atherosclerosis of aorta: Secondary | ICD-10-CM

## 2020-07-22 DIAGNOSIS — E559 Vitamin D deficiency, unspecified: Secondary | ICD-10-CM

## 2020-07-22 DIAGNOSIS — N182 Chronic kidney disease, stage 2 (mild): Secondary | ICD-10-CM

## 2020-07-22 DIAGNOSIS — R6889 Other general symptoms and signs: Secondary | ICD-10-CM

## 2020-07-22 DIAGNOSIS — E782 Mixed hyperlipidemia: Secondary | ICD-10-CM

## 2020-07-22 DIAGNOSIS — Z860101 Personal history of adenomatous and serrated colon polyps: Secondary | ICD-10-CM

## 2020-07-22 DIAGNOSIS — J4520 Mild intermittent asthma, uncomplicated: Secondary | ICD-10-CM

## 2020-07-22 DIAGNOSIS — Z6826 Body mass index (BMI) 26.0-26.9, adult: Secondary | ICD-10-CM

## 2020-07-22 DIAGNOSIS — D692 Other nonthrombocytopenic purpura: Secondary | ICD-10-CM

## 2020-07-22 DIAGNOSIS — K222 Esophageal obstruction: Secondary | ICD-10-CM

## 2020-07-22 DIAGNOSIS — J42 Unspecified chronic bronchitis: Secondary | ICD-10-CM

## 2020-07-22 DIAGNOSIS — G894 Chronic pain syndrome: Secondary | ICD-10-CM

## 2020-07-22 DIAGNOSIS — Z0000 Encounter for general adult medical examination without abnormal findings: Secondary | ICD-10-CM

## 2020-07-22 DIAGNOSIS — D472 Monoclonal gammopathy: Secondary | ICD-10-CM

## 2020-07-22 DIAGNOSIS — Z86010 Personal history of colonic polyps: Secondary | ICD-10-CM

## 2020-07-22 DIAGNOSIS — M51369 Other intervertebral disc degeneration, lumbar region without mention of lumbar back pain or lower extremity pain: Secondary | ICD-10-CM

## 2020-07-22 NOTE — Patient Instructions (Addendum)
  Ms. Soulliere , Thank you for taking time to come for your Medicare Wellness Visit. I appreciate your ongoing commitment to your health goals. Please review the following plan we discussed and let me know if I can assist you in the future.   These are the goals we discussed: Goals    . DIET - INCREASE WATER INTAKE     4-5 bottles daily     . Exercise 150 min/wk Moderate Activity    . LDL CALC < 100       This is a list of the screening recommended for you and due dates:  Health Maintenance  Topic Date Due  . Colon Cancer Screening  05/17/2020  . COVID-19 Vaccine (4 - Booster for [place] series) 09/21/2020  . Tetanus Vaccine  04/24/2024  . Flu Shot  Completed  . DEXA scan (bone density measurement)  Completed  . Pneumonia vaccines  Completed  . HPV Vaccine  Aged Out     HOW TO SCHEDULE A MAMMOGRAM  [facility]  7 a.m.-6:30 p.m., Monday 7 a.m.-5 p.m., Tuesday-Friday Schedule an appointment by calling [phone].  Can also schedule bone density for after July 2022

## 2020-07-23 ENCOUNTER — Other Ambulatory Visit: Payer: Self-pay | Admitting: Adult Health

## 2020-07-23 ENCOUNTER — Other Ambulatory Visit: Payer: Self-pay | Admitting: Internal Medicine

## 2020-07-23 DIAGNOSIS — Z79899 Other long term (current) drug therapy: Secondary | ICD-10-CM

## 2020-07-23 DIAGNOSIS — K219 Gastro-esophageal reflux disease without esophagitis: Secondary | ICD-10-CM

## 2020-07-23 DIAGNOSIS — N289 Disorder of kidney and ureter, unspecified: Secondary | ICD-10-CM

## 2020-07-23 LAB — COMPLETE METABOLIC PANEL WITH GFR
AG Ratio: 1.7 (calc) (ref 1.0–2.5)
ALT: 13 U/L (ref 6–29)
AST: 18 U/L (ref 10–35)
Albumin: 4.9 g/dL (ref 3.6–5.1)
Alkaline phosphatase (APISO): 63 U/L (ref 37–153)
BUN/Creatinine Ratio: 24 (calc) — ABNORMAL HIGH (ref 6–22)
BUN: 22 mg/dL (ref 7–25)
CO2: 22 mmol/L (ref 20–32)
Calcium: 10 mg/dL (ref 8.6–10.4)
Chloride: 103 mmol/L (ref 98–110)
Creat: 0.93 mg/dL — ABNORMAL HIGH (ref 0.60–0.88)
GFR, Est African American: 66 mL/min/{1.73_m2} (ref >=60)
GFR, Est Non African American: 57 mL/min/{1.73_m2} — ABNORMAL LOW (ref >=60)
Globulin: 2.9 g/dL (calc) (ref 1.9–3.7)
Glucose, Bld: 121 mg/dL — ABNORMAL HIGH (ref 65–99)
Potassium: 4.2 mmol/L (ref 3.5–5.3)
Sodium: 139 mmol/L (ref 135–146)
Total Bilirubin: 0.5 mg/dL (ref 0.2–1.2)
Total Protein: 7.8 g/dL (ref 6.1–8.1)

## 2020-07-23 LAB — LIPID PANEL
Cholesterol: 174 mg/dL (ref <200)
HDL: 77 mg/dL (ref >=50)
LDL Cholesterol (Calc): 80 mg/dL (calc)
Non-HDL Cholesterol (Calc): 97 mg/dL (calc) (ref <130)
Total CHOL/HDL Ratio: 2.3 (calc) (ref <5.0)
Triglycerides: 86 mg/dL (ref <150)

## 2020-07-23 LAB — CBC WITH DIFFERENTIAL/PLATELET
Absolute Monocytes: 356 cells/uL (ref 200–950)
Basophils Absolute: 32 cells/uL (ref 0–200)
Basophils Relative: 0.3 %
Eosinophils Absolute: 11 cells/uL — ABNORMAL LOW (ref 15–500)
Eosinophils Relative: 0.1 %
HCT: 46.1 % — ABNORMAL HIGH (ref 35.0–45.0)
Hemoglobin: 15.5 g/dL (ref 11.7–15.5)
Lymphs Abs: 1004 cells/uL (ref 850–3900)
MCH: 31.1 pg (ref 27.0–33.0)
MCHC: 33.6 g/dL (ref 32.0–36.0)
MCV: 92.6 fL (ref 80.0–100.0)
MPV: 11.2 fL (ref 7.5–12.5)
Monocytes Relative: 3.3 %
Neutro Abs: 9396 cells/uL — ABNORMAL HIGH (ref 1500–7800)
Neutrophils Relative %: 87 %
Platelets: 362 10*3/uL (ref 140–400)
RBC: 4.98 10*6/uL (ref 3.80–5.10)
RDW: 13.1 % (ref 11.0–15.0)
Total Lymphocyte: 9.3 %
WBC: 10.8 10*3/uL (ref 3.8–10.8)

## 2020-07-23 LAB — MAGNESIUM: Magnesium: 2.4 mg/dL (ref 1.5–2.5)

## 2020-07-23 LAB — TSH: TSH: 0.6 mIU/L (ref 0.40–4.50)

## 2020-07-24 ENCOUNTER — Other Ambulatory Visit: Payer: Self-pay | Admitting: Internal Medicine

## 2020-07-30 ENCOUNTER — Other Ambulatory Visit: Payer: Self-pay | Admitting: Internal Medicine

## 2020-08-01 ENCOUNTER — Other Ambulatory Visit: Payer: Self-pay | Admitting: Internal Medicine

## 2020-08-01 DIAGNOSIS — J209 Acute bronchitis, unspecified: Secondary | ICD-10-CM

## 2020-08-01 DIAGNOSIS — J45901 Unspecified asthma with (acute) exacerbation: Secondary | ICD-10-CM

## 2020-08-01 DIAGNOSIS — J4520 Mild intermittent asthma, uncomplicated: Secondary | ICD-10-CM

## 2020-08-03 ENCOUNTER — Telehealth: Payer: Self-pay

## 2020-08-03 ENCOUNTER — Telehealth: Payer: Self-pay | Admitting: Rheumatology

## 2020-08-03 NOTE — Telephone Encounter (Signed)
Duplicate message. closing

## 2020-08-03 NOTE — Telephone Encounter (Signed)
Patient left a voicemail requesting a return call to discuss her grant for Prolia injection.

## 2020-08-03 NOTE — Telephone Encounter (Signed)
Patient calling in reference to getting a grant for Prolia. Patient states the girl that gives the injection told her to call this year to remind her about getting the grant. Please call to advise.

## 2020-08-03 NOTE — Telephone Encounter (Signed)
Returned patient's call, advised that there are no grants open current and we have her on our grant wish list to enroll when it opens back up again.  Reconfirmed patient's income and household size. Patients next prolia is due in May. We will let her know if we can obtain an grant or if she will need to receive through the hospital again.

## 2020-08-06 ENCOUNTER — Other Ambulatory Visit: Payer: Self-pay | Admitting: Internal Medicine

## 2020-08-06 DIAGNOSIS — R112 Nausea with vomiting, unspecified: Secondary | ICD-10-CM

## 2020-08-11 ENCOUNTER — Other Ambulatory Visit: Payer: Self-pay | Admitting: Internal Medicine

## 2020-08-12 ENCOUNTER — Ambulatory Visit (INDEPENDENT_AMBULATORY_CARE_PROVIDER_SITE_OTHER): Payer: PPO | Admitting: *Deleted

## 2020-08-12 ENCOUNTER — Other Ambulatory Visit: Payer: Self-pay

## 2020-08-12 ENCOUNTER — Other Ambulatory Visit: Payer: Self-pay | Admitting: Internal Medicine

## 2020-08-12 DIAGNOSIS — N289 Disorder of kidney and ureter, unspecified: Secondary | ICD-10-CM | POA: Diagnosis not present

## 2020-08-12 MED ORDER — BREZTRI AEROSPHERE 160-9-4.8 MCG/ACT IN AERO: INHALATION_SPRAY | RESPIRATORY_TRACT | 3 refills | Status: DC

## 2020-08-12 NOTE — Progress Notes (Signed)
Patient here to recheck a BMET, due to elevated kidney functions. Patient states she has increased her fluid intake and does not take NSAIDS.

## 2020-08-13 LAB — BASIC METABOLIC PANEL WITH GFR
BUN: 15 mg/dL (ref 7–25)
CO2: 26 mmol/L (ref 20–32)
Calcium: 9.7 mg/dL (ref 8.6–10.4)
Chloride: 102 mmol/L (ref 98–110)
Creat: 0.84 mg/dL (ref 0.60–0.88)
GFR, Est African American: 75 mL/min/{1.73_m2} (ref >=60)
GFR, Est Non African American: 65 mL/min/{1.73_m2} (ref >=60)
Glucose, Bld: 127 mg/dL — ABNORMAL HIGH (ref 65–99)
Potassium: 4.4 mmol/L (ref 3.5–5.3)
Sodium: 137 mmol/L (ref 135–146)

## 2020-08-14 ENCOUNTER — Ambulatory Visit: Payer: PPO

## 2020-08-17 ENCOUNTER — Telehealth: Payer: Self-pay | Admitting: *Deleted

## 2020-08-17 ENCOUNTER — Other Ambulatory Visit: Payer: Self-pay | Admitting: Internal Medicine

## 2020-08-17 MED ORDER — FLUTICASONE-SALMETEROL 500-50 MCG/DOSE IN AEPB: INHALATION_SPRAY | RESPIRATORY_TRACT | 2 refills | Status: DC

## 2020-08-17 NOTE — Addendum Note (Signed)
Addended by: Vanessa Barbara on: 08/17/2020 08:47 AM   Modules accepted: Orders

## 2020-08-17 NOTE — Telephone Encounter (Signed)
Returned call to patient regarding the cost of Brestri inhaler. Dr Melford Aase sent in [place] diskcus for the patient. Patient is aware .

## 2020-08-23 ENCOUNTER — Other Ambulatory Visit: Payer: Self-pay | Admitting: Adult Health Nurse Practitioner

## 2020-08-23 DIAGNOSIS — Z79899 Other long term (current) drug therapy: Secondary | ICD-10-CM

## 2020-08-23 DIAGNOSIS — K219 Gastro-esophageal reflux disease without esophagitis: Secondary | ICD-10-CM

## 2020-08-31 ENCOUNTER — Other Ambulatory Visit (HOSPITAL_COMMUNITY)
Admission: RE | Admit: 2020-08-31 | Discharge: 2020-08-31 | Disposition: A | Discharge status: Home / Self Care | Payer: PPO | Source: Ambulatory Visit | Attending: Internal Medicine | Admitting: Internal Medicine

## 2020-08-31 DIAGNOSIS — Z20822 Contact with and (suspected) exposure to covid-19: Secondary | ICD-10-CM | POA: Insufficient documentation

## 2020-08-31 DIAGNOSIS — Z01812 Encounter for preprocedural laboratory examination: Secondary | ICD-10-CM | POA: Diagnosis not present

## 2020-09-01 LAB — SARS CORONAVIRUS 2 (TAT 6-24 HRS): SARS Coronavirus 2: NEGATIVE

## 2020-09-02 ENCOUNTER — Other Ambulatory Visit: Payer: Self-pay

## 2020-09-02 ENCOUNTER — Ambulatory Visit (INDEPENDENT_AMBULATORY_CARE_PROVIDER_SITE_OTHER): Payer: PPO | Admitting: Internal Medicine

## 2020-09-02 ENCOUNTER — Encounter: Payer: Self-pay | Admitting: Internal Medicine

## 2020-09-02 DIAGNOSIS — J4520 Mild intermittent asthma, uncomplicated: Secondary | ICD-10-CM

## 2020-09-02 MED ORDER — FLUTICASONE-SALMETEROL 100-50 MCG/DOSE IN AEPB: INHALATION_SPRAY | RESPIRATORY_TRACT | 11 refills | Status: DC

## 2020-09-02 NOTE — Progress Notes (Signed)
Subjective:   Patient ID: [name] [name], female    DOB: [birth year]    MRN: [id number]     Brief patient profile:  13 yowf never smoker with large hiatal hernia & recurrent asthmatic bronchitis , about twice/yr for many yrs. Lung parenchyma looks ok on Ct scans 10/2006 & 12/10. PFTs nml in 12/09 & mild airway obstruction in 08/2009.  First presented 3/09 . CT neck for thyroid imaging s/o mediastinal contour abnormal. Ct chest >> large hiatal hernia & BL upper lobe hazy , ground glass opacity.  Dec, 2010  [name] [name])  Fe def anemia requiring transfusions, small bowel AVMs suspected.  Mar 2011 Acute visit- c/o cough and wheezing >> augmentin helped, enalapril changed to diovan       11/19/2013 acute  ov/[name] [name] re:  [name] [name] since July 4th 2015 w/e Chief Complaint  Patient presents with  . Acute Visit    Pt c/o cough, congestion and sinus pressure x 1 wk. Cough is prod with minimal to moderate greenish yellow sputum.     symptoms were acute "like a head cold" then into chest, progressive, with sob with exertion but not at rest Zpak July 10  [facility] 2 weeks prior to OV   got kyphoplasty, then sinus congestion Used neb am of of with good results  rec Augmentin 875 mg take one pill twice daily  X 10 days - take at breakfast and supper with large glass of water.  It would help reduce the usual side effects (diarrhea and yeast infections) if you ate cultured yogurt at lunch.  Prednisone 10 mg take  4 each am x 2 days,   2 each am x 2 days,  1 each am x 2 days and stop  Work on inhaler technique:  relax and gently blow all the way out then take a nice smooth deep breath back in, triggering the inhaler at same time you start breathing in.  Hold for up to 5 seconds if you can.  Rinse and gargle with water when done Return in 2 weeks if not completely better   > did not return      09/08/2017 1st [facility] office visit/ [name] [name]  Re AB  Chief Complaint  Patient presents with  . Pulmonary Consult    Last  seen here in July 2015. She states that she is here for just "check up". Breathing is overall doing well.  She rarely uses her albuterol inhaler.   uses dulera 200 when over does it  Sleeping s resp problems  No cough  Walks at [employer] at a slow pace with a cane and not really limited by doe   rec In the event of cough/ wheeze or worse short of breath > dulera  100 or 200 up to 2 pffs every 12 hours Work on inhaler technique:   Plan B = Backup Only use your albuterol as a rescue medication Plan C = Crisis - only use your albuterol nebulizer if you first try Plan B and it fails to help > ok to use the nebulizer up to every 4 hours but if start needing it regularly call for immediate appointment    07/20/20 NP eval for acute exac rec Zpack take as directed  Mucinex DM liquid Twice daily  As needed  Cough/congestion  Prednisone taper over next week .  Albuterol inhaler As needed   Continue on BREZTRI 2 puffs Twice daily , brush/rinse/gargle after use.  Follow up with Dr. Melvyn Novas  In 6-8  weeks with PFTs and As needed  Please contact office for sooner follow up if symptoms do not improve or worsen or seek emergency care       09/02/2020  Re-establish ov/[name] [name] re: ? Asthma maint on advair 250/50 Chief Complaint  Patient presents with  . Follow-up    Had PFT today-sob worse at times, cough yellow   Dyspnea:  Steps x one flight > sob/ shops anywhere at nl pace = [facility] = can walk nl pace, flat grade, can't hurry or go uphills or steps s sob   Cough: assoc with sob flares   Sleeping:bed is 10 degrees/ two pillows on side SABA use: no change  02: none  Covid status:   X 3 vax    No obvious day to day or daytime variability or assoc excess/ purulent sputum or mucus plugs or hemoptysis or cp or chest tightness, subjective wheeze or overt sinus or hb symptoms.   Sleeping as above  without nocturnal  or early am exacerbation  of respiratory  c/o's or need for noct saba. Also denies any obvious  fluctuation of symptoms with weather or environmental changes or other aggravating or alleviating factors except as outlined above   No unusual exposure hx or h/o childhood pna/ asthma or knowledge of premature birth.  Current Allergies, Complete Past Medical History, Past Surgical History, Family History, and Social History were reviewed in [employer] record.  ROS  The following are not active complaints unless bolded Hoarseness, sore throat, dysphagia, dental problems, itching, sneezing,  nasal congestion or discharge of excess mucus or purulent secretions, ear ache,   fever, chills, sweats, unintended wt loss or wt gain, classically pleuritic or exertional cp,  orthopnea pnd or arm/hand swelling  or leg swelling, presyncope, palpitations, abdominal pain, anorexia, nausea, vomiting, diarrhea  or change in bowel habits or change in bladder habits, change in stools or change in urine, dysuria, hematuria,  rash, arthralgias, visual complaints, headache, numbness, weakness or ataxia or problems with walking or coordination,  change in mood or  memory.        Current Meds  Medication Sig  . albuterol (VENTOLIN HFA) 108 (90 Base) MCG/ACT inhaler Inhale 2 puffs into the lungs every 6 (six) hours as needed for wheezing or shortness of breath.  . ALPRAZolam (XANAX) 0.5 MG tablet Take 0.5-1 tablets (0.25-0.5 mg total) by mouth daily as needed for anxiety. Take 1/2 tablet 1- 2 x /day ONLY if needed for Anxiety Attack &  limit to 5 days /week to avoid Addiction - DO NOT take with Hydrocodone or Codeine  . amLODipine (NORVASC) 10 MG tablet TAKE 1 TABLET BY MOUTH ONCE DAILY FOR BLOOD PRESSURE AND FOR HEART  . aspirin EC 81 MG tablet Take 81 mg by mouth at bedtime.   . Cholecalciferol (VITAMIN D3) 5000 units CAPS Take 5,000 Units by mouth 2 (two) times daily.   . cyclobenzaprine (FLEXERIL) 10 MG tablet Take  1 tablet  3 x /day  as needed for back pain /muscle spasm  . denosumab (PROLIA)  60 MG/ML SOSY injection Inject 60 mg into the skin every 6 (six) months.  . diclofenac sodium (VOLTAREN) 1 % GEL Apply 2 g topically 4 (four) times daily. (Patient taking differently: Apply 2 g topically 4 (four) times daily as needed (pain).)  . dicyclomine (BENTYL) 20 MG tablet Take 1 tablet (20 mg total) by mouth 3 (three) times daily as needed for spasms. Take 1 tablet 3 x  /  day before meals for Cramping, Nausea or Diarhea  . DULoxetine (CYMBALTA) 30 MG capsule TAKE 1 CAPSULE BY MOUTH ONCE DAILY, FOR MOOD AND CHRONIC PAIN  . Fluticasone-Salmeterol (ADVAIR DISKUS) 250-50 MCG/DOSE AEPB Use  1 inhalation  2 x /day (every 12 hours )  . gabapentin (NEURONTIN) 300 MG capsule Take 1 capsule (300 mg total) by mouth 4 (four) times daily.  [name] ketoconazole (NIZORAL) 2 % cream Apply 1 application topically 2 x /day (Patient taking differently: Apply 1 application topically 2 (two) times daily. Apply 1 application topically 2 x /day)  . Magnesium 400 MG TABS Take 400 mg by mouth 2 (two) times daily.   . meclizine (ANTIVERT) 25 MG tablet Take 1/2 to 1 tablet 2 to 3 x /day ONLY if needed  For Dizziness / Vertigo (Patient taking differently: Take 12.5-25 mg by mouth 3 (three) times daily as needed for dizziness. Take 1/2 to 1 tablet 2 to 3 x /day ONLY if needed  For Dizziness / Vertigo)  . metoprolol tartrate (LOPRESSOR) 50 MG tablet TAKE 1 TABLET BY MOUTH TWICE DAILY FOR BLOOD PRESSURE AND FOR HEART  . Multiple Vitamins-Minerals (MULTIVITAMIN WITH MINERALS) tablet Take 1 tablet by mouth daily.  . nitroGLYCERIN (NITROSTAT) 0.4 MG SL tablet Place 1 tablet (0.4 mg total) under the tongue every 5 (five) minutes as needed for chest pain.  [name] omeprazole (PRILOSEC) 40 MG capsule Take 1 capsule  2 x /day  to Prevent Indigestion & Heartburn  . prochlorperazine (COMPAZINE) 5 MG tablet Take 1 tablet (5 mg total) by mouth every 6 (six) hours as needed for refractory nausea / vomiting.  . promethazine (PHENERGAN) 25 MG tablet  TAKE 1 TABLET BY MOUTH EVERY 4 HOURS AS NEEDED FOR NAUSEA AND VOMITING  . rOPINIRole (REQUIP) 2 MG tablet Take  1 tablet  3 x /day  for Restless Legs  . senna-docusate (SENOKOT-S) 8.6-50 MG tablet Take 2 tablets by mouth 2 (two) times daily.  . sucralfate (CARAFATE) 1 g tablet TAKE 1 TABLET BY MOUTH 4 TIMES DAILY BEFORE  MEALS  AND  AT  BEDTIME  FOR  INDIGESTION  AND  HEARTBURN  . trospium (SANCTURA) 20 MG tablet Take 20 mg by mouth 2 (two) times daily.  . vitamin B-12 (CYANOCOBALAMIN) 1000 MCG tablet Take 1,000 mcg by mouth daily.   Current Facility-Administered Medications for the 09/02/20 encounter (Office Visit) with [name], MD  Medication  . calcitonin (salmon) (MIACALCIN/FORTICAL) nasal spray 1 spray               Objective:   Physical Exam      09/02/2020         152   09/08/2017         164   11/19/13 132 lb (59.875 kg)  11/14/13 127 lb (57.607 kg)  11/05/13 125 lb (56.7 kg)       Vital signs reviewed  09/02/2020  - Note at rest 02 sats  95% on RA   General appearance:    amb wf nad   HEENT : pt wearing mask not removed for exam due to covid -19 concerns.    NECK :  without JVD/Nodes/TM/ nl carotid upstrokes bilaterally   LUNGS: no acc muscle use,  Nl contour chest which is clear to A and P bilaterally without cough on insp or exp maneuvers   CV:  RRR  no s3 or murmur or increase in P2, and no edema   ABD:  soft and  nontender with nl inspiratory excursion in the supine position. No bruits or organomegaly appreciated, bowel sounds nl  MS:  Nl gait/ ext warm without deformities, calf tenderness, cyanosis or clubbing No obvious joint restrictions   SKIN: warm and dry without lesions    NEURO:  alert, approp, nl sensorium with  no motor or cerebellar deficits apparent.       I personally reviewed images and agree with radiology impression as follows:  CXR:   PA and Lateral 05/08/21 Linear scarring or atelectasis at the right base. No consolidation or  effusion. Stable cardiomediastinal silhouette with aortic atherosclerosis. No pneumothorax. Multiple treated compression deformities in the thoracic and lumbar spine. Surgical hardware in the lumbar spine.     Assessment & Plan:

## 2020-09-02 NOTE — Patient Instructions (Signed)
Pulmonary function test performed today. 

## 2020-09-02 NOTE — Progress Notes (Signed)
Full PFT performed today. °

## 2020-09-02 NOTE — Patient Instructions (Addendum)
Change advair to 250/50 one twice daily   Please schedule a follow up office visit in 6 weeks, call sooner if needed   Error:  We originally recorded advair at 500 but it was 250 so the reduction should have been to the 100/50 bid/ pt informed by phone of th error on the avs but the correct rx was submitted for the 100/50 strength

## 2020-09-03 ENCOUNTER — Encounter: Payer: Self-pay | Admitting: Internal Medicine

## 2020-09-03 LAB — PULMONARY FUNCTION TEST
DL/VA % pred: 132 %
DL/VA: 5.38 ml/min/mmHg/L
DLCO cor % pred: 98 %
DLCO cor: 18.32 ml/min/mmHg
DLCO unc % pred: 103 %
DLCO unc: 19.41 ml/min/mmHg
FEF 25-75 Post: 1.27 L/sec
FEF 25-75 Pre: 0.78 L/sec
FEF2575-%Change-Post: 61 %
FEF2575-%Pred-Post: 96 %
FEF2575-%Pred-Pre: 59 %
FEV1-%Change-Post: 11 %
FEV1-%Pred-Post: 73 %
FEV1-%Pred-Pre: 65 %
FEV1-Post: 1.38 L
FEV1-Pre: 1.23 L
FEV1FVC-%Change-Post: 8 %
FEV1FVC-%Pred-Pre: 97 %
FEV6-%Change-Post: 3 %
FEV6-%Pred-Post: 74 %
FEV6-%Pred-Pre: 72 %
FEV6-Post: 1.78 L
FEV6-Pre: 1.73 L
FEV6FVC-%Pred-Post: 106 %
FEV6FVC-%Pred-Pre: 106 %
FVC-%Change-Post: 3 %
FVC-%Pred-Post: 70 %
FVC-%Pred-Pre: 68 %
FVC-Post: 1.78 L
FVC-Pre: 1.73 L
Post FEV1/FVC ratio: 77 %
Post FEV6/FVC ratio: 100 %
Pre FEV1/FVC ratio: 72 %
Pre FEV6/FVC Ratio: 100 %

## 2020-09-03 NOTE — Progress Notes (Signed)
Office Visit Note  Patient: [name] [name]             Date of Birth: 1935-06-25           MRN: [id number]             PCP: [name], MD Referring: [name], MD Visit Date: 09/17/2020 Occupation: @GUAROCC @  Subjective:  Discuss scheduling prolia injection   History of Present Illness: DRUSCILLA PETSCH is a 85 y.o. female with history of osteoporosis and osteoarthritis.  Patient presents today to discuss proceeding with her next Prolia injection.  Her last injection was performed on 04/07/2020.  She tolerated the injection without any side effects.  She has continued to take a calcium and vitamin D supplement on a daily basis as recommended.  She has not had any recent falls or fractures.  She has no longer following up with Dr. Lorin Mercy status post pelvic fracture last fall.  She continues to have chronic lower back pain but it has not progressed recently.  She experiences occasional discomfort in her right knee joint but denies any joint swelling.  She also has been having increased pain in both hands especially the left hand.  Her discomfort is most severe in the left [facility] joint and in the second DIP joint.  She has not noticed any joint swelling.  She has been using Voltaren gel topically as needed for pain relief.     Activities of Daily Living:  Patient reports morning stiffness for 0 none.   Patient Denies nocturnal pain.  Difficulty dressing/grooming: Denies Difficulty climbing stairs: Denies Difficulty getting out of chair: Reports Difficulty using hands for taps, buttons, cutlery, and/or writing: Denies  Review of Systems  Constitutional: Positive for fatigue.  HENT: Positive for mouth dryness. Negative for mouth sores and nose dryness.   Eyes: Negative for pain, visual disturbance and dryness.  Respiratory: Negative for cough, hemoptysis and difficulty breathing.   Cardiovascular: Negative for chest pain, palpitations, hypertension and swelling in legs/feet.   Gastrointestinal: Negative for blood in stool, constipation and diarrhea.  Endocrine: Negative for excessive thirst and increased urination.  Genitourinary: Negative for difficulty urinating and painful urination.  Musculoskeletal: Positive for arthralgias, gait problem and joint pain. Negative for joint swelling, myalgias, muscle weakness, morning stiffness, muscle tenderness and myalgias.  Skin: Negative for color change, pallor, rash, hair loss, nodules/bumps, skin tightness, ulcers and sensitivity to sunlight.  Allergic/Immunologic: Negative for susceptible to infections.  Neurological: Negative for dizziness, numbness, headaches and weakness.  Hematological: Negative for bruising/bleeding tendency and swollen glands.  Psychiatric/Behavioral: Negative for depressed mood and sleep disturbance. The patient is not nervous/anxious.     PMFS History:  Patient Active Problem List   Diagnosis Date Noted  . History of adenomatous polyp of colon 07/21/2020  . Atypical chest pain 07/08/2020  . History of fundoplication   . Epigastric pain 07/09/2019  . Senile purpura ([place]) 07/05/2019  . Abnormal glucose 03/01/2018  . FHx: heart disease 03/01/2018  . CKD (chronic kidney disease) stage 2, GFR 60-89 ml/min 09/27/2017  . T12 compression fracture ([place]) 09/26/2017  . Chronic bronchitis ([place]) 08/09/2017  . Atherosclerosis of aorta ([place]) 04/05/2017  . Odynophagia   . Spinal stenosis, lumbar region, with neurogenic claudication 05/29/2015  . DDD (degenerative disc disease), lumbar 04/21/2015  . Hyperlipidemia, mixed 11/24/2014  . Gastroesophageal reflux disease 11/24/2014  . Depression, major, in partial remission ([place]) 09/26/2014  . Chronic pain syndrome 09/26/2014  . Coronary artery disease involving native  heart without angina pectoris   . Osteoporosis 02/10/2014  . Prediabetes 08/14/2013  . Vitamin D deficiency 08/14/2013  . MGUS (monoclonal gammopathy of unknown significance) 03/05/2013   . ESOPHAGEAL STRICTURE 08/28/2008  . Mild intermittent asthma without complication 82/95/6213  . Incarcerated recurrent paraesophageal hiatal hernia s/p lap redo repair [id number] 01/31/2008  . Essential hypertension 07/25/2007    Past Medical History:  Diagnosis Date  . Anxiety    takes Xanax daily as needed  . Asthma    Albuterol daily as needed  . Blood transfusion without reported diagnosis   . CAD (coronary artery disease)    LHC 2003 with 40% pLAD, 30% mLAD, 100% D1 (moderate vessel), 100%  D2 (small vessel), 95% mid small OM2.  Pt had PTCA to D1;  D2 and OM2 small vessels and tx medically;  Last Myoview (2012): anterior infarct seen with scar, no ischemia. EF normal. Med Rx // Myoview (12/14):  Low risk; ant scar w/ peri-infarct ischemia; EF 55% with ant AK // Myoview 5/16: EF 55-65, ant, apical scar, no ischemia   . Cataract   . Chronic back pain    HNP, DDD, spinal stenosis, vertebral compression fractures.   . Chronic nausea    takes Zofran daily as needed.  normal gastric emptying study 03/2013  . CKD (chronic kidney disease), stage III ([place]) 09/27/2017  . Constipation    felt to be functional. takes Senokot and Miralax daily.  treated with Linzess in past.   . DIVERTICULOSIS, COLON 08/28/2008   Qualifier: Diagnosis of  By: [name], [name], [name]    . Elevated LFTs 2015   probably med induced DILI, from Augmentin vs Pravastatin  . GERD (gastroesophageal reflux disease)    hx of esophageal stricture   . Hiatal hernia    Paraesophageal hernias. s/p fundoplications in 6962 and 04/2013  . History of bronchitis several of yrs ago  . History of colon polyps 03/2009, 03/2011   adenomatous colon polyps, no high grade dysplasia.   [name] History of vertigo    no meds  . Hyperlipidemia    takes Pravastatin daily  . Hypertension    takes Amlodipine,Metoprolol,and Losartan daily  . Multiple closed pelvic fractures without disruption of pelvic circle (HCC)   . Nausea 03/2016  .  Osteoporosis   . PONV (postoperative nausea and vomiting)    yrs ago  . Pubic ramus fracture ([place]) 12/20/2019  . Slow urinary stream    takes Rapaflo daily  . TIA (transient ischemic attack) 1995   no residual effects noted  . Vertebral compression fracture ([place]) 06/23/2012    Family History  Problem Relation Age of Onset  . Colon cancer Mother 70  . Heart attack Father 4       heart attack  . Brain cancer Brother        tumor   . Heart disease Brother   . Heart disease Brother   . Kidney disease Daughter   . Esophageal cancer Neg Hx   . Stomach cancer Neg Hx    Past Surgical History:  Procedure Laterality Date  . APPENDECTOMY     patient unsure of date  . BACK SURGERY    . BLADDER REPAIR    . CHOLECYSTECTOMY     Patient unsure of date.  . CORONARY ANGIOPLASTY  11/16/2001   1 stent  . ESOPHAGEAL MANOMETRY N/A 03/25/2013   Procedure: ESOPHAGEAL MANOMETRY (EM);  Surgeon: Inda Castle, MD;  Location: [facility];  Service: Endoscopy;  Laterality:  N/A;  . ESOPHAGOGASTRODUODENOSCOPY N/A 03/15/2013   Procedure: ESOPHAGOGASTRODUODENOSCOPY (EGD);  Surgeon: Jerene Bears, MD;  Location[facility] ENDOSCOPY;  Service: Endoscopy;  Laterality: N/A;  . ESOPHAGOGASTRODUODENOSCOPY N/A 12/25/2015   Procedure: ESOPHAGOGASTRODUODENOSCOPY (EGD);  Surgeon: Doran Stabler, MD;  Location: [facility] ENDOSCOPY;  Service: Endoscopy;  Laterality: N/A;  . EYE SURGERY  11/2000   bilateral cataracts with lens implant  . FIXATION KYPHOPLASTY LUMBAR SPINE  X 2   "L3-4"  . HEMORRHOID SURGERY    . HIATAL HERNIA REPAIR  06/03/2011   Procedure: LAPAROSCOPIC REPAIR OF HIATAL HERNIA;  Surgeon: Adin Hector, MD;  Location: [facility] ORS;  Service: General;  Laterality: N/A;  . HIATAL HERNIA REPAIR N/A 04/24/2013   Procedure: LAPAROSCOPIC  REPAIR RECURRENT PARASOPHAGEAL HIATAL HERNIA WITH FUNDOPLICATION;  Surgeon: Adin Hector, MD;  Location: [facility];  Service: General;  Laterality: N/A;  . INSERTION OF MESH N/A  04/24/2013   Procedure: INSERTION OF MESH;  Surgeon: Adin Hector, MD;  Location: [facility] OR[facility];  Service: General;  Laterality: N/A;  . IR KYPHO THORACIC WITH BONE BIOPSY  09/29/2017  . IR KYPHO THORACIC WITH BONE BIOPSY  11/03/2017  . IR KYPHO THORACIC WITH BONE BIOPSY  02/14/2019  . IR KYPHO THORACIC WITH BONE BIOPSY  04/02/2019  . LAPAROSCOPIC LYSIS OF ADHESIONS N/A 04/24/2013   Procedure: LAPAROSCOPIC LYSIS OF ADHESIONS;  Surgeon: Adin Hector, MD;  Location: [facility];  Service: General;  Laterality: N/A;  . LAPAROSCOPIC NISSEN FUNDOPLICATION  [id number]   Procedure: LAPAROSCOPIC NISSEN FUNDOPLICATION;  Surgeon: Adin Hector, MD;  Location: [facility];  Service: General;  Laterality: N/A;  . LUMBAR FUSION  12/07/2015   Right L2-3 facetectomy, posterolateral fusion L2-3 with pedicle screws, rods, local bone graft, Vivigen cancellous chips. Fusion extended to the L1 level with pedicle screws and rods  . LUMBAR LAMINECTOMY/DECOMPRESSION MICRODISCECTOMY Right 06/26/2012   Procedure: LUMBAR LAMINECTOMY/DECOMPRESSION MICRODISCECTOMY;  Surgeon: Jessy Oto, MD;  Location: [place];  Service: Orthopedics;  Laterality: Right;  RIGHT L4-5 MICRODISCECTOMY  . LUMBAR LAMINECTOMY/DECOMPRESSION MICRODISCECTOMY N/A 05/29/2015   Procedure: RIGHT L2-3 MICRODISCECTOMY;  Surgeon: Jessy Oto, MD;  Location: [place];  Service: Orthopedics;  Laterality: N/A;  . TOTAL ABDOMINAL HYSTERECTOMY  1975   partial  . UPPER GASTROINTESTINAL ENDOSCOPY     Social History   Social History Narrative   Lives with husband.   Right-handed.   3 cups caffeine per day.   Immunization History  Administered Date(s) Administered  . DT (Pediatric) 05/06/2014  . Influenza Split 02/07/2011  . Influenza Whole 02/09/2009, 01/19/2010, 02/13/2012  . Influenza, High Dose Seasonal PF 03/22/2013, 01/15/2014, 02/27/2015, 01/14/2016, 01/18/2017, 02/14/2018, 02/14/2019, 03/16/2020  . PFIZER(Purple Top)SARS-COV-2 Vaccination 05/25/2019,  06/15/2019, 03/24/2020  . Pneumococcal Conjugate-13 05/06/2014  . Pneumococcal Polysaccharide-23 02/09/2011  . Pneumococcal-Unspecified 02/06/2010, 02/09/2012  . Td 05/10/2003     Objective: Vital Signs: BP 120/69 (BP Location: Left Arm, Patient Position: Sitting, Cuff Size: Normal)   Pulse 74   Resp 16   Ht 5\' 3"  (1.6 m)   Wt 154 lb (69.9 kg)   BMI 27.28 kg/m    Physical Exam Vitals and nursing note reviewed.  Constitutional:      Appearance: She is well-developed.  HENT:     Head: Normocephalic and atraumatic.  Eyes:     Conjunctiva/sclera: Conjunctivae normal.  Pulmonary:     Effort: Pulmonary effort is normal.  Abdominal:     Palpations: Abdomen is soft.  Musculoskeletal:  Cervical back: Normal range of motion.  Skin:    General: Skin is warm and dry.     Capillary Refill: Capillary refill takes less than 2 seconds.  Neurological:     Mental Status: She is alert and oriented to person, place, and time.  Psychiatric:        Behavior: Behavior normal.      Musculoskeletal Exam: C-spine has limited range of motion.  Postural thoracic kyphosis noted.  Painful and limited range of motion of lumbar spine.  Shoulder joints, elbow joints, wrist joints, MCPs, PIPs, DIPs have good range of motion with no synovitis.  She has PIP and severe DIP thickening consistent with osteoarthritis of both hands.  [place] joint prominence bilaterally.  Tenderness over the left CMC joint.  Complete fist formation bilaterally.  Hip joints have limited ROM bilaterally.  Knee joints good ROM with no warmth or effusion.  Ankle joints good ROM with no tenderness or joint swelling.   CDAI Exam: CDAI Score: -- Patient Global: --; Provider Global: -- Swollen: --; Tender: -- Joint Exam 09/17/2020   No joint exam has been documented for this visit   There is currently no information documented on the homunculus. Go to the Rheumatology activity and complete the homunculus joint  exam.  Investigation: No additional findings.  Imaging: MM 3D SCREEN BREAST BILATERAL  Result Date: 09/10/2020 CLINICAL DATA:  Screening. EXAM: DIGITAL SCREENING BILATERAL MAMMOGRAM WITH TOMOSYNTHESIS AND CAD TECHNIQUE: Bilateral screening digital craniocaudal and mediolateral oblique mammograms were obtained. Bilateral screening digital breast tomosynthesis was performed. The images were evaluated with computer-aided detection. COMPARISON:  Previous exam(s). ACR Breast Density Category b: There are scattered areas of fibroglandular density. FINDINGS: There are no findings suspicious for malignancy. The images were evaluated with computer-aided detection. IMPRESSION: No mammographic evidence of malignancy. A result letter of this screening mammogram will be mailed directly to the patient. RECOMMENDATION: Screening mammogram in one year. (Code:SM-B-01Y) BI-RADS CATEGORY  1: Negative. Electronically Signed   By: Nolon Nations M.D.   On: 09/10/2020 10:09    Recent Labs: Lab Results  Component Value Date   WBC 10.8 07/22/2020   HGB 15.5 07/22/2020   PLT 362 07/22/2020   NA 137 08/12/2020   K 4.4 08/12/2020   CL 102 08/12/2020   CO2 26 08/12/2020   GLUCOSE 127 (H) 08/12/2020   BUN 15 08/12/2020   CREATININE 0.84 08/12/2020   BILITOT 0.5 07/22/2020   ALKPHOS 185 (H) 01/29/2020   AST 18 07/22/2020   ALT 13 07/22/2020   PROT 7.8 07/22/2020   ALBUMIN 3.9 01/29/2020   CALCIUM 9.7 08/12/2020   GFRAA 75 08/12/2020    Speciality Comments: No specialty comments available.  Procedures:  No procedures performed Allergies: Ace inhibitors, Aspartame, Atorvastatin, Biaxin [clarithromycin], Daliresp [roflumilast], Erythromycin, and Levofloxacin   Assessment / Plan:     Visit Diagnoses: Age-related osteoporosis without current pathological fracture - DEXA 11/12/18: The BMD measured at Femur Neck Left is 0.657 g/cm2 with a T-score of -2.7. Due for updated DEXA in July 2022.  Future order was  placed by [name].  She presented today to discuss proceeding with her next Prolia injection.  Her last injection was performed on 04/07/2020 which she tolerated without any side effects.  She has been taking a calcium and vitamin D supplement on a daily basis as recommended.  She has not had any falls or fractures since her last office visit.  We discussed the importance of performing resistive exercises as well  as continue on calcium and vitamin D supplements daily.  We will check a vitamin D level today.  An order for Prolia will be placed pending lab results.  She is aware that she is due to repeat her DEXA in July 2022.  We will review those results with her once completed.- Plan: VITAMIN D 25 Hydroxy (Vit-D Deficiency, Fractures)  Vitamin D deficiency - History of vitamin D deficiency.  She has been taking vitamin D 5000 units twice daily.  We will check vitamin D level today.  Prolia injection pending lab results.  Plan: VITAMIN D 25 Hydroxy (Vit-D Deficiency, Fractures)  Encounter for medication monitoring - Last prolia injection: 04/07/2020 (received at the hospital).  No grants available.  CMP and vitamin D will be updated today.  Orders pending lab results.- Plan: COMPLETE METABOLIC PANEL WITH GFR, VITAMIN D 25 Hydroxy (Vit-D Deficiency, Fractures)  History of vertebral compression fracture - h/o kyphoplasty.    H/O kyphoplasty - Performed by Dr. Estanislado Pandy in the past.   Multiple closed fractures of pelvis without disruption of pelvic ring, sequela -Previously followed by Dr. Lorin Mercy.  Updated x-rays on 02/21/20 revealed routine healing.  She continues to have persistent discomfort in her lower back and pelvis.  She has not had any recent falls or fractures that she is aware.  Primary osteoarthritis of both hands: She has PIP and severe DIP thickening consistent with osteoarthritis of both hands.  CMC joint prominence and thickening noted bilaterally.  She has tenderness palpation over  the left CMC joint and the left second DIP joint.  No synovitis was noted.  She was able to make a complete fist bilaterally.  We discussed the importance of joint protection and muscle strengthening.  She was given a prescription for a left CMC joint brace as well as arthritis compression gloves.  She can continue to use Voltaren gel topically as needed for pain relief.  Primary osteoarthritis of both knees: She has occasional discomfort in her right knee joint.  She has good range of motion of both knee joints on examination today.  No warmth or effusion was noted.  She can use Voltaren gel topically as needed for pain relief.  DDD (degenerative disc disease), lumbar: She continues to have chronic pain in her lower back.  She has been taking gabapentin as prescribed for pain relief and uses Flexeril 10 mg 3 times daily as needed for lower back pain and muscle spasms.  Facet arthropathy: She has chronic lower back pain.  Other medical conditions are listed as follows:   History of hypercholesterolemia  History of gastroesophageal reflux (GERD)  History of anxiety  History of cholecystectomy  History of appendectomy  History of asthma  History of neuropathy: She continues to take gabapentin as prescribed.   History of hypertension: BP was 120/69 today in the office.     Orders: Orders Placed This Encounter  Procedures  . COMPLETE METABOLIC PANEL WITH GFR  . VITAMIN D 25 Hydroxy (Vit-D Deficiency, Fractures)   No orders of the defined types were placed in this encounter.   Follow-Up Instructions: Return in about 6 months (around 03/20/2021) for Osteoporosis, Osteoarthritis.   Ofilia Neas, PA-C  Note - This record has been created using Dragon software.  Chart creation errors have been sought, but may not always  have been located. Such creation errors do not reflect on  the standard of medical care.

## 2020-09-04 ENCOUNTER — Encounter: Payer: Self-pay | Admitting: Gastroenterology

## 2020-09-07 ENCOUNTER — Telehealth: Payer: Self-pay | Admitting: *Deleted

## 2020-09-07 NOTE — Telephone Encounter (Signed)
Spoke with the pt  I advised her of typo regarding the advair on last AVS  She has the Advair 100 and is aware that this is the correct rx, not advair 500  Nothing further needed

## 2020-09-07 NOTE — Telephone Encounter (Signed)
-----   Message from [name], MD sent at 09/03/2020 11:14 AM EDT ----- Call re typo on AVS   Should say change adair to 100/50 bid and that's what was submitted to pharmacy

## 2020-09-09 ENCOUNTER — Ambulatory Visit
Admission: RE | Admit: 2020-09-09 | Discharge: 2020-09-09 | Disposition: A | Discharge status: Home / Self Care | Payer: PPO | Source: Ambulatory Visit | Attending: Internal Medicine | Admitting: Internal Medicine

## 2020-09-09 ENCOUNTER — Other Ambulatory Visit: Payer: Self-pay

## 2020-09-09 ENCOUNTER — Other Ambulatory Visit: Payer: Self-pay | Admitting: Internal Medicine

## 2020-09-09 DIAGNOSIS — Z1231 Encounter for screening mammogram for malignant neoplasm of breast: Secondary | ICD-10-CM | POA: Diagnosis not present

## 2020-09-17 ENCOUNTER — Other Ambulatory Visit: Payer: Self-pay

## 2020-09-17 ENCOUNTER — Telehealth: Payer: Self-pay | Admitting: Pharmacist

## 2020-09-17 ENCOUNTER — Encounter: Payer: Self-pay | Admitting: Physician Assistant

## 2020-09-17 ENCOUNTER — Ambulatory Visit (INDEPENDENT_AMBULATORY_CARE_PROVIDER_SITE_OTHER): Payer: PPO | Admitting: Physician Assistant

## 2020-09-17 VITALS — BP 120/69 | HR 74 | Resp 16 | Ht 63.0 in | Wt 154.0 lb

## 2020-09-17 DIAGNOSIS — Z5181 Encounter for therapeutic drug level monitoring: Secondary | ICD-10-CM | POA: Diagnosis not present

## 2020-09-17 DIAGNOSIS — Z8719 Personal history of other diseases of the digestive system: Secondary | ICD-10-CM

## 2020-09-17 DIAGNOSIS — Z9889 Other specified postprocedural states: Secondary | ICD-10-CM | POA: Diagnosis not present

## 2020-09-17 DIAGNOSIS — M47819 Spondylosis without myelopathy or radiculopathy, site unspecified: Secondary | ICD-10-CM

## 2020-09-17 DIAGNOSIS — M19042 Primary osteoarthritis, left hand: Secondary | ICD-10-CM

## 2020-09-17 DIAGNOSIS — Z8659 Personal history of other mental and behavioral disorders: Secondary | ICD-10-CM

## 2020-09-17 DIAGNOSIS — Z8679 Personal history of other diseases of the circulatory system: Secondary | ICD-10-CM

## 2020-09-17 DIAGNOSIS — E559 Vitamin D deficiency, unspecified: Secondary | ICD-10-CM

## 2020-09-17 DIAGNOSIS — Z9049 Acquired absence of other specified parts of digestive tract: Secondary | ICD-10-CM

## 2020-09-17 DIAGNOSIS — M170 Bilateral primary osteoarthritis of knee: Secondary | ICD-10-CM

## 2020-09-17 DIAGNOSIS — Z8639 Personal history of other endocrine, nutritional and metabolic disease: Secondary | ICD-10-CM | POA: Diagnosis not present

## 2020-09-17 DIAGNOSIS — M810 Age-related osteoporosis without current pathological fracture: Secondary | ICD-10-CM

## 2020-09-17 DIAGNOSIS — S3282XS Multiple fractures of pelvis without disruption of pelvic ring, sequela: Secondary | ICD-10-CM

## 2020-09-17 DIAGNOSIS — M5136 Other intervertebral disc degeneration, lumbar region: Secondary | ICD-10-CM

## 2020-09-17 DIAGNOSIS — M19041 Primary osteoarthritis, right hand: Secondary | ICD-10-CM

## 2020-09-17 DIAGNOSIS — Z8709 Personal history of other diseases of the respiratory system: Secondary | ICD-10-CM

## 2020-09-17 DIAGNOSIS — Z8781 Personal history of (healed) traumatic fracture: Secondary | ICD-10-CM | POA: Diagnosis not present

## 2020-09-17 DIAGNOSIS — Z8669 Personal history of other diseases of the nervous system and sense organs: Secondary | ICD-10-CM

## 2020-09-17 NOTE — Telephone Encounter (Signed)
Patient had OV with Hazel Sams today and Prolia labs were drawn.  Last Prolia was 04/07/20. Due 10/04/20.  I discussed that no Prolia grants are open still and she received through medical benefit last time and is amenable to receiving through medical again (paid several hundred dollars through medical and is amenable to re-paying this)  Can we please re-verify Prolia benefits: [id number] Coverage: [employer] Phone: [phone]  Knox Saliva, PharmD, MPH Clinical Pharmacist (Rheumatology and Pulmonology)

## 2020-09-18 LAB — COMPLETE METABOLIC PANEL WITH GFR
AG Ratio: 1.9 (calc) (ref 1.0–2.5)
ALT: 11 U/L (ref 6–29)
AST: 17 U/L (ref 10–35)
Albumin: 4.1 g/dL (ref 3.6–5.1)
Alkaline phosphatase (APISO): 53 U/L (ref 37–153)
BUN: 17 mg/dL (ref 7–25)
CO2: 25 mmol/L (ref 20–32)
Calcium: 9.4 mg/dL (ref 8.6–10.4)
Chloride: 106 mmol/L (ref 98–110)
Creat: 0.8 mg/dL (ref 0.60–0.88)
GFR, Est African American: 80 mL/min/{1.73_m2} (ref >=60)
GFR, Est Non African American: 69 mL/min/{1.73_m2} (ref >=60)
Globulin: 2.2 g/dL (calc) (ref 1.9–3.7)
Glucose, Bld: 99 mg/dL (ref 65–99)
Potassium: 4.1 mmol/L (ref 3.5–5.3)
Sodium: 140 mmol/L (ref 135–146)
Total Bilirubin: 0.7 mg/dL (ref 0.2–1.2)
Total Protein: 6.3 g/dL (ref 6.1–8.1)

## 2020-09-18 LAB — VITAMIN D 25 HYDROXY (VIT D DEFICIENCY, FRACTURES): Vit D, 25-Hydroxy: 88 ng/mL (ref 30–100)

## 2020-09-18 NOTE — Progress Notes (Signed)
CBC and CMP WNL

## 2020-09-21 ENCOUNTER — Other Ambulatory Visit: Payer: Self-pay | Admitting: Pharmacist

## 2020-09-21 ENCOUNTER — Other Ambulatory Visit: Payer: Self-pay | Admitting: Adult Health

## 2020-09-21 DIAGNOSIS — Z79899 Other long term (current) drug therapy: Secondary | ICD-10-CM

## 2020-09-21 DIAGNOSIS — M810 Age-related osteoporosis without current pathological fracture: Secondary | ICD-10-CM

## 2020-09-21 DIAGNOSIS — K219 Gastro-esophageal reflux disease without esophagitis: Secondary | ICD-10-CM

## 2020-09-21 NOTE — Progress Notes (Addendum)
Next infusion for Prolia and due for updated orders ON OR AFTER 10/04/20  Diagnosis: age-related osteoporosis  Dose: 60mg  every 6 months  Last Clinic Visit: 09/17/20 Next Clinic Visit: 03/18/21  Last dose: 04/07/20 Labs: Vitamin D normal on 09/17/20, CMP wnl on 09/17/20  Orders placed for Prolia x 1 dose.  Called patient and provided with phone number for: [facility] [phone]) [place]  - must schedule dose to be completed on or after 10/04/20  Will f/u to ensure scheduled and completed  Knox Saliva, PharmD, MPH Clinical Pharmacist (Rheumatology and Pulmonology)  Addendum: 09/29/20 : pt states she called [facility] and left VM today. Advised her that the earliest she may receive Prolia is next Monday, 10/05/20. She verbalized understanding. Will f/u to ensure scheduled and completed  10/02/20: patient's Prolia scheduled for 10/12/20. Will f/u to ensure completed

## 2020-09-21 NOTE — Telephone Encounter (Signed)
Called [employer] and verified pt's benefits for [phone]. Pt has a 20% coinsurance with no authorization needed.  Call Ref#: [id number]

## 2020-09-21 NOTE — Telephone Encounter (Signed)
Patient amenable to paying 20% coinsurance for Prolia. Orders for [facility] placed as labs wnl to proceed. Patient provided with phone number for [facility] to schedule

## 2020-09-29 ENCOUNTER — Telehealth: Payer: Self-pay | Admitting: *Deleted

## 2020-09-29 NOTE — Telephone Encounter (Signed)
Patient called and states she can feel a pea size lump uner her right arm, with no pain and moves around easily. Dr Melford Aase  Advised since it moves easily, OK to wait until her OV 10/15/2020. Patient called to call back if lump changes.

## 2020-10-12 ENCOUNTER — Ambulatory Visit (HOSPITAL_COMMUNITY)
Admission: RE | Admit: 2020-10-12 | Discharge: 2020-10-12 | Disposition: A | Discharge status: Home / Self Care | Payer: PPO | Source: Ambulatory Visit | Attending: Rheumatology | Admitting: Rheumatology

## 2020-10-12 ENCOUNTER — Other Ambulatory Visit: Payer: Self-pay

## 2020-10-12 DIAGNOSIS — M810 Age-related osteoporosis without current pathological fracture: Secondary | ICD-10-CM | POA: Diagnosis not present

## 2020-10-12 MED ORDER — DENOSUMAB 60 MG/ML ~~LOC~~ SOSY
PREFILLED_SYRINGE | SUBCUTANEOUS | Status: AC
  Administered 2020-10-12: 60 mg via SUBCUTANEOUS
  Filled 2020-10-12: qty 1

## 2020-10-12 MED ORDER — DENOSUMAB 60 MG/ML ~~LOC~~ SOSY: 60.0000 mg | PREFILLED_SYRINGE | Freq: Once | SUBCUTANEOUS | Status: AC

## 2020-10-14 ENCOUNTER — Other Ambulatory Visit: Payer: Self-pay

## 2020-10-14 ENCOUNTER — Ambulatory Visit: Payer: PPO | Admitting: Internal Medicine

## 2020-10-14 ENCOUNTER — Ambulatory Visit (INDEPENDENT_AMBULATORY_CARE_PROVIDER_SITE_OTHER): Payer: PPO | Admitting: Internal Medicine

## 2020-10-14 ENCOUNTER — Encounter: Payer: Self-pay | Admitting: Internal Medicine

## 2020-10-14 DIAGNOSIS — J4520 Mild intermittent asthma, uncomplicated: Secondary | ICD-10-CM

## 2020-10-14 MED ORDER — BUDESONIDE-FORMOTEROL FUMARATE 80-4.5 MCG/ACT IN AERO: INHALATION_SPRAY | RESPIRATORY_TRACT | 11 refills | Status: DC

## 2020-10-14 NOTE — Progress Notes (Signed)
Subjective:   Patient ID: [name] [name], female    DOB: [birth year]    MRN: [id number]     Brief patient profile:  10 yowf never smoker with large hiatal hernia & recurrent asthmatic bronchitis , about twice/yr for many yrs. Lung parenchyma looks ok on Ct scans 10/2006 & 12/10. PFTs nml in 12/09 & mild airway obstruction in 08/2009.  First presented 3/09 . CT neck for thyroid imaging s/o mediastinal contour abnormal. Ct chest >> large hiatal hernia & BL upper lobe hazy , ground glass opacity.  Dec, 2010  [name] [name])  Fe def anemia requiring transfusions, small bowel AVMs suspected.  Mar 2011 Acute visit- c/o cough and wheezing >> augmentin helped, enalapril changed to diovan       11/19/2013 acute  ov/[name] [name] re:  [name] [name] since July 4th 2015 w/e Chief Complaint  Patient presents with  . Acute Visit    Pt c/o cough, congestion and sinus pressure x 1 wk. Cough is prod with minimal to moderate greenish yellow sputum.     symptoms were acute "like a head cold" then into chest, progressive, with sob with exertion but not at rest Zpak July 10  [facility] 2 weeks prior to OV   got kyphoplasty, then sinus congestion Used neb am of of with good results  rec Augmentin 875 mg take one pill twice daily  X 10 days - take at breakfast and supper with large glass of water.  It would help reduce the usual side effects (diarrhea and yeast infections) if you ate cultured yogurt at lunch.  Prednisone 10 mg take  4 each am x 2 days,   2 each am x 2 days,  1 each am x 2 days and stop  Work on inhaler technique:  relax and gently blow all the way out then take a nice smooth deep breath back in, triggering the inhaler at same time you start breathing in.  Hold for up to 5 seconds if you can.  Rinse and gargle with water when done Return in 2 weeks if not completely better   > did not return      09/08/2017 1st [facility] office visit/ [name] [name]  Re AB  Chief Complaint  Patient presents with  . Pulmonary Consult    Last  seen here in July 2015. She states that she is here for just "check up". Breathing is overall doing well.  She rarely uses her albuterol inhaler.   uses dulera 200 when over does it  Sleeping s resp problems  No cough  Walks at [employer] at a slow pace with a cane and not really limited by doe   rec In the event of cough/ wheeze or worse short of breath > dulera  100 or 200 up to 2 pffs every 12 hours Work on inhaler technique:   Plan B = Backup Only use your albuterol as a rescue medication Plan C = Crisis - only use your albuterol nebulizer if you first try Plan B and it fails to help > ok to use the nebulizer up to every 4 hours but if start needing it regularly call for immediate appointment    07/20/20 NP eval for acute exac rec Zpack take as directed  Mucinex DM liquid Twice daily  As needed  Cough/congestion  Prednisone taper over next week .  Albuterol inhaler As needed   Continue on BREZTRI 2 puffs Twice daily , brush/rinse/gargle after use.  Follow up with Dr. Melvyn Novas  In 6-8  weeks with PFTs and As needed  Please contact office for sooner follow up if symptoms do not improve or worsen or seek emergency care       09/02/2020  Re-establish ov/[name] [name] re: ? Asthma maint on advair 250/50 Chief Complaint  Patient presents with  . Follow-up    Had PFT today-sob worse at times, cough yellow   Dyspnea:  Steps x one flight > sob/ shops anywhere at nl pace = MMRC1 = can walk nl pace, flat grade, can't hurry or go uphills or steps s sob   Cough: assoc with sob flares   Sleeping:bed is 10 degrees/ two pillows on side SABA use: no change  02: none  Covid status:   X 3 vax rec Change advair to 250/50 one twice daily  Please schedule a follow up office visit in 6 weeks, call sooner if needed  Error:  We originally recorded advair at 500 but it was 250 so the reduction should have been to the 100/50 bid/ pt informed by phone of th error on the avs but the correct rx was submitted for the  100/50 strength   10/14/2020  f/u ov/[name] [name] re:    Asthma worse cough  actually  on 500/50 after all (very confused with names of meds and strengths) "it's whatever they gave me"  No chief complaint on file. Dyspnea: MMRC1 = can walk nl pace, flat grade, can't hurry or go uphills or steps s sob   Cough: worse with exertion esp outdoors  Sleeping: 10 degrees/ on side with 2pillows under head  SABA use: not using  02: none Covid status:   vax x 3    No obvious day to day or daytime variability or assoc excess/ purulent sputum or mucus plugs or hemoptysis or cp or chest tightness, subjective wheeze or overt sinus or hb symptoms.   Sleeping  without nocturnal  or early am exacerbation  of respiratory  c/o's or need for noct saba. Also denies any obvious fluctuation of symptoms with weather or environmental changes or other aggravating or alleviating factors except as outlined above   No unusual exposure hx or h/o childhood pna/ asthma or knowledge of premature birth.  Current Allergies, Complete Past Medical History, Past Surgical History, Family History, and Social History were reviewed in [employer] record.  ROS  The following are not active complaints unless bolded Hoarseness, sore throat, dysphagia, dental problems, itching, sneezing,  nasal congestion or discharge of excess mucus or purulent secretions, ear ache,   fever, chills, sweats, unintended wt loss or wt gain, classically pleuritic or exertional cp,  orthopnea pnd or arm/hand swelling  or leg swelling, presyncope, palpitations, abdominal pain, anorexia, nausea, vomiting, diarrhea  or change in bowel habits or change in bladder habits, change in stools or change in urine, dysuria, hematuria,  rash, arthralgias, visual complaints, headache, numbness, weakness or ataxia or problems with walking or coordination,  change in mood or  memory.        Current Meds - - NOTE:   Unable to verify as accurately reflecting what  pt takes     Medication Sig  . albuterol (VENTOLIN HFA) 108 (90 Base) MCG/ACT inhaler Inhale 2 puffs into the lungs every 6 (six) hours as needed for wheezing or shortness of breath.  . ALPRAZolam (XANAX) 0.5 MG tablet Take 0.5-1 tablets (0.25-0.5 mg total) by mouth daily as needed for anxiety. Take 1/2 tablet 1- 2 x /day ONLY if needed for Anxiety Attack &  limit to 5 days /week to avoid Addiction - DO NOT take with Hydrocodone or Codeine  . amLODipine (NORVASC) 10 MG tablet TAKE 1 TABLET BY MOUTH ONCE DAILY FOR BLOOD PRESSURE AND FOR HEART  . aspirin EC 81 MG tablet Take 81 mg by mouth at bedtime.   . Cholecalciferol (VITAMIN D3) 5000 units CAPS Take 5,000 Units by mouth 2 (two) times daily.   . cyclobenzaprine (FLEXERIL) 10 MG tablet Take  1 tablet  3 x /day  as needed for back pain /muscle spasm  . denosumab (PROLIA) 60 MG/ML SOSY injection Inject 60 mg into the skin every 6 (six) months.  . diclofenac sodium (VOLTAREN) 1 % GEL Apply 2 g topically 4 (four) times daily. (Patient taking differently: Apply 2 g topically 4 (four) times daily as needed (pain).)  . dicyclomine (BENTYL) 20 MG tablet Take 1 tablet (20 mg total) by mouth 3 (three) times daily as needed for spasms. Take 1 tablet 3 x  /day before meals for Cramping, Nausea or Diarhea  . DULoxetine (CYMBALTA) 30 MG capsule TAKE 1 CAPSULE BY MOUTH ONCE DAILY, FOR MOOD AND CHRONIC PAIN  . Fluticasone-Salmeterol (ADVAIR DISKUS) 100-50 MCG/DOSE AEPB One daily  . gabapentin (NEURONTIN) 300 MG capsule Take 1 capsule (300 mg total) by mouth 4 (four) times daily.  [name] ketoconazole (NIZORAL) 2 % cream Apply 1 application topically 2 x /day (Patient taking differently: Apply 1 application topically 2 (two) times daily. Apply 1 application topically 2 x /day)  . Magnesium 400 MG TABS Take 400 mg by mouth 2 (two) times daily.   . meclizine (ANTIVERT) 25 MG tablet Take 1/2 to 1 tablet 2 to 3 x /day ONLY if needed  For Dizziness / Vertigo (Patient taking  differently: Take 12.5-25 mg by mouth 3 (three) times daily as needed for dizziness. Take 1/2 to 1 tablet 2 to 3 x /day ONLY if needed  For Dizziness / Vertigo)  . metoprolol tartrate (LOPRESSOR) 50 MG tablet TAKE 1 TABLET BY MOUTH TWICE DAILY FOR BLOOD PRESSURE AND FOR HEART  . Multiple Vitamins-Minerals (MULTIVITAMIN WITH MINERALS) tablet Take 1 tablet by mouth daily.  . nitroGLYCERIN (NITROSTAT) 0.4 MG SL tablet Place 1 tablet (0.4 mg total) under the tongue every 5 (five) minutes as needed for chest pain.  [name] omeprazole (PRILOSEC) 40 MG capsule Take 1 capsule  2 x /day  to Prevent Indigestion & Heartburn  . prochlorperazine (COMPAZINE) 5 MG tablet Take 1 tablet (5 mg total) by mouth every 6 (six) hours as needed for refractory nausea / vomiting.  . promethazine (PHENERGAN) 25 MG tablet TAKE 1 TABLET BY MOUTH EVERY 4 HOURS AS NEEDED FOR NAUSEA AND VOMITING  . rOPINIRole (REQUIP) 2 MG tablet Take  1 tablet  3 x /day  for Restless Legs  . senna-docusate (SENOKOT-S) 8.6-50 MG tablet Take 2 tablets by mouth 2 (two) times daily.  . sucralfate (CARAFATE) 1 g tablet TAKE 1 TABLET BY MOUTH 4 TIMES DAILY BEFORE MEAL(S) AND AT BEDTIME FOR INDIGESTION AND FOR HEARTBURN  . trospium (SANCTURA) 20 MG tablet Take 20 mg by mouth 2 (two) times daily.  . vitamin B-12 (CYANOCOBALAMIN) 1000 MCG tablet Take 1,000 mcg by mouth daily.   Current Facility-Administered Medications for the 10/14/20 encounter (Office Visit) with [name], MD  Medication  . calcitonin (salmon) (MIACALCIN/FORTICAL) nasal spray 1 spray                   Objective:   Physical  Exam     10/14/2020          150 09/02/2020        152   09/08/2017         164   11/19/13 132 lb (59.875 kg)  11/14/13 127 lb (57.607 kg)  11/05/13 125 lb (56.7 kg)     Vital signs reviewed  10/14/2020  - Note at rest 02 sats  96% on RA   General appearance:   Pleasant amb wf nasal tone to voice easily confused with details of care   HEENT : pt  wearing mask not removed for exam due to covid -19 concerns.    NECK :  without JVD/Nodes/TM/ nl carotid upstrokes bilaterally   LUNGS: no acc muscle use,  Nl contour chest coarse insp/exp rhonchi  Bilaterally much transmitted from upper airway  without cough on insp or exp maneuvers   CV:  RRR  no s3 or murmur or increase in P2, and no edema   ABD:  soft and nontender with nl inspiratory excursion in the supine position. No bruits or organomegaly appreciated, bowel sounds nl  MS:  Nl gait/ ext warm without deformities, calf tenderness, cyanosis or clubbing No obvious joint restrictions   SKIN: warm and dry without lesions    NEURO:  alert, approp, nl sensorium with  no motor or cerebellar deficits apparent.             Assessment & Plan:

## 2020-10-14 NOTE — Progress Notes (Signed)
CPE AND FOLLOW UP  Assessment:   Encounter for Annual Physical Exam with abnormal findings Due annually  Schedule DEXA, phone number given   Atherosclerosis of aorta ([place]) Per numerous CTs Control blood pressure, cholesterol, glucose, increase exercise.   Essential hypertension - continue medications, DASH diet, exercise and monitor at home. Call if greater than 130/80.  - CBC with Differential/Platelet - CMP/GFR - TSH - Magnesium -EKG -UA/Microalbumin  Prediabetes Discussed general issues about diabetes pathophysiology and management., Educational material distributed., Suggested low cholesterol diet., Encouraged aerobic exercise., Discussed foot care., Reminded to get yearly retinal exam. - Hemoglobin A1c q1m   Mixed hyperlipidemia -continue medications, check lipids, decrease fatty foods, increase activity.  - Lipid panel - TSH  Coronary artery disease due to lipid rich plaque Control blood pressure, cholesterol, glucose, increase exercise. Statin for LDL goal <70  - Lipid panel  Recurrent major depressive disorder, in partial remission (HCC) Continue medications  Lifestyle discussed: diet/exerise, sleep hygiene, stress management, hydration  Incarcerated recurrent paraesophageal hiatal hernia s/p lap redo repair [id number] Improved, continue PPI   Asthma, unspecified asthma severity, uncomplicated Continue inhalers Go to the ER if any chest pain, shortness of breath, nausea, dizziness, severe HA, changes vision/speech  Chronic bronchitis (HCC) Continue inhalers, follows with pulm - may need allergy med, try adding mucinex - follow up pulm if not improving  Osteoporosis Continue prolia via Dr. Estanislado Pandy, continue Vit D and Ca, weight bearing exercises - schedule DEXA   Herniated lumbar intervertebral disc Continue pain management  Vitamin D deficiency - Vit D  25 hydroxy (rtn osteoporosis monitoring)  MGUS (monoclonal gammopathy of unknown  significance) Continue to monitor; had negative biopsy  - CMP/GFR  Medication management - Magnesium  Chronic pain syndrome Continue pain management   Anxiety continue medications, stress management techniques discussed, increase water, good sleep hygiene discussed, increase exercise, and increase veggies.  - TSH   Gastroesophageal reflux disease, esophagitis presence not specified Continue PPI/H2 blocker, diet discussed - CMP/GFR - Magnesium    ESOPHAGEAL STRICTURE Improved on last, follow up GI as recommended; continue PPI   Constipation, chronic Continue on senokot, GI following  Spondylolisthesis, lumbar region Followed by spine specialist and pain management.   DDD (degenerative disc disease), lumbar Continue follow up pain management  Spinal stenosis, lumbar region, with neurogenic claudication Continue follow up pain management  History of lumbar spinal fusion Continue follow up pain management  Senile purpura ([place]) Secondary to age and aspirin; reassured; protect skin   Orders Placed This Encounter  Procedures   CBC with Differential/Platelet   COMPLETE METABOLIC PANEL WITH GFR   Magnesium   Lipid panel   TSH   Hemoglobin A1c   VITAMIN D 25 Hydroxy (Vit-D Deficiency, Fractures)   Microalbumin / creatinine urine ratio   Urinalysis, Routine w reflex microscopic   Vitamin B12   Iron, TIBC and Ferritin Panel   EKG 12-Lead      Over 30 minutes of exam, counseling, chart review, and critical decision making was performed  Future Appointments  Date Time Provider [place]  01/14/2021 10:30 AM [name], MD [facility] None  02/18/2021 10:30 AM [name], MD [facility] None  03/18/2021 10:15 AM [name], MD [facility] None  07/22/2021 10:30 AM [name], NP [facility] None  10/18/2021  9:00 AM [name], NP [facility] None     Plan:   During the course of the visit the patient was educated and counseled about  appropriate screening and preventive services including:  Pneumococcal vaccine  Influenza vaccine Td vaccine Prevnar 13 Screening electrocardiogram Screening mammography Bone densitometry screening Colorectal cancer screening Diabetes screening Glaucoma screening Nutrition counseling  Advanced directives: given info/requested copies   Subjective:   [name] [name] is a 85 y.o. female who presents for CPE and follow up. She has Essential hypertension; Mild intermittent asthma without complication; ESOPHAGEAL STRICTURE; Incarcerated recurrent paraesophageal hiatal hernia s/p lap redo repair [id number]; MGUS (monoclonal gammopathy of unknown significance); Vitamin D deficiency; Osteoporosis; Coronary artery disease involving native heart without angina pectoris; Depression, major, in partial remission ([place]); Chronic pain syndrome; Hyperlipidemia, mixed; Gastroesophageal reflux disease; DDD (degenerative disc disease), lumbar; Spinal stenosis, lumbar region, with neurogenic claudication; Odynophagia; Atherosclerosis of aorta ([place]); Chronic bronchitis (HCC); T12 compression fracture (HCC); CKD (chronic kidney disease) stage 2, GFR 60-89 ml/min; Abnormal glucose (prediabetes); FHx: heart disease; Senile purpura ([place]); Epigastric pain; History of fundoplication; Atypical chest pain; and History of adenomatous polyp of colon on their problem list.   Patient has a chronic pain syndrome and is followed by Dr Nicholaus Bloom (pain management) and Dr Lorin Mercy.  Hx of numerous lumbar surgeries, disc and fusions historically, spinal stenosis, several thoracic compression fractures with kyphoplasty. She had mechanical fall on concrete slab Aug 2021 and had pelvic fracture, felt routine healing by Dr. Lorin Mercy. She completed PT, denies other falls or fractures. She is followed by Dr. Estanislado Pandy for prolia injections; hx/o Osteoporosis with T-2.6 at last DexaBMD on 11/2018 (improved from -2.8 in 2018). She expressed  preference to avoid opioids and is prescribed gabapentin and cymbalta for pain and reports does well.   Follows closely with GI; recently seen 07/08/2020 for epigastric pain/nausea; hx of fundoplication; she was advised to continue omeprazole 20 mg BID, switch carafate to liquid formulation and intiated on course of diflucan for possible recurrent candida esophagitis. She also has hx of adenomatous polyp of colon per Dr. Havery Moros, due for 3 year follow up that was recommended, has been contacted to schedule.   She followes with Dr. Melvyn Novas for COPD per imaging only, asthma, on symbicort and doing fairly but persistent productive cough for several months. Jut saw Dr. Melvyn Novas yesterday.   She has history of MGUS with negative BX.   she has a diagnosis of anxiety./depression and is currently on cymbalta 30 mg daily, xanax 0.25-0.5 mg TID PRN, reports symptoms are well controlled on current regimen. she reports she last took 1-2 months ago, uses rarely.   BMI is Body mass index is 27.18 kg/m., she has been working on diet and exercise, trying to work more in garden and trying to increase walking after being very sedentary due to pain.  Wt Readings from Last 3 Encounters:  10/15/20 151 lb (68.5 kg)  10/14/20 150 lb (68 kg)  09/17/20 154 lb (69.9 kg)   She also has history of PTCA/stent placed in 2003 and low risk myoview in 2014.  She has aortic atherosclerosis per numerous CTs, e.g 04/2019. She follows with Dr. Marlou Porch.  Her blood pressure has been controlled at home, today their BP is BP: 116/64  She does not work out due to chronic pain. She denies CP but endorses some exertional dyspnea if rushes.   She is not on cholesterol medication (has been off of pravastatin 20 mg for several months per patient) and denies myalgias. Her cholesterol is not at goal of LDL <70. The cholesterol last visit was:   Lab Results  Component Value Date   CHOL 174 07/22/2020   HDL 77  07/22/2020   [place] 80 07/22/2020    TRIG 86 07/22/2020   CHOLHDL 2.3 07/22/2020    She has been working on diet and exercise for prediabetes (x 2010), and denies polydipsia, polyuria and visual disturbances. Last A1C in the office was:  Lab Results  Component Value Date   HGBA1C 5.9 (H) 04/20/2020   Last GFR  Lab Results  Component Value Date   GFRNONAA 69 09/17/2020   Patient is on Vitamin D supplement and at goal at last check:  Lab Results  Component Value Date   VD25OH 88 09/17/2020     She is on a B12 supplement;  Lab Results  Component Value Date   VITAMINB12 1,402 (H) 10/16/2019      Medication Review Current Outpatient Medications on File Prior to Visit  Medication Sig Dispense Refill   ALPRAZolam (XANAX) 0.5 MG tablet Take 0.5-1 tablets (0.25-0.5 mg total) by mouth daily as needed for anxiety. Take 1/2 tablet 1- 2 x /day ONLY if needed for Anxiety Attack &  limit to 5 days /week to avoid Addiction - DO NOT take with Hydrocodone or Codeine 10 tablet 0   amLODipine (NORVASC) 10 MG tablet TAKE 1 TABLET BY MOUTH ONCE DAILY FOR BLOOD PRESSURE AND FOR HEART 90 tablet 3   aspirin EC 81 MG tablet Take 81 mg by mouth at bedtime.      budesonide-formoterol (SYMBICORT) 80-4.5 MCG/ACT inhaler Take 2 puffs first thing in am and then another 2 puffs about 12 hours later. 1 each 11   Cholecalciferol (VITAMIN D3) 5000 units CAPS Take 5,000 Units by mouth 2 (two) times daily.      cyclobenzaprine (FLEXERIL) 10 MG tablet Take  1 tablet  3 x /day  as needed for back pain /muscle spasm 90 tablet 0   denosumab (PROLIA) 60 MG/ML SOSY injection Inject 60 mg into the skin every 6 (six) months. 1 mL 0   diclofenac sodium (VOLTAREN) 1 % GEL Apply 2 g topically 4 (four) times daily. (Patient taking differently: Apply 2 g topically 4 (four) times daily as needed (pain).) 1 Tube 1   dicyclomine (BENTYL) 20 MG tablet Take 1 tablet (20 mg total) by mouth 3 (three) times daily as needed for spasms. Take 1 tablet 3 x  /day before meals  for Cramping, Nausea or Diarhea 10 tablet 0   DULoxetine (CYMBALTA) 30 MG capsule TAKE 1 CAPSULE BY MOUTH ONCE DAILY, FOR MOOD AND CHRONIC PAIN 90 capsule 1   gabapentin (NEURONTIN) 300 MG capsule Take 1 capsule (300 mg total) by mouth 4 (four) times daily. 20 capsule 0   ketoconazole (NIZORAL) 2 % cream Apply 1 application topically 2 x /day (Patient taking differently: Apply 1 application topically 2 (two) times daily. Apply 1 application topically 2 x /day) 60 g 3   Magnesium 400 MG TABS Take 400 mg by mouth 2 (two) times daily.      meclizine (ANTIVERT) 25 MG tablet Take 1/2 to 1 tablet 2 to 3 x /day ONLY if needed  For Dizziness / Vertigo (Patient taking differently: Take 12.5-25 mg by mouth 3 (three) times daily as needed for dizziness. Take 1/2 to 1 tablet 2 to 3 x /day ONLY if needed  For Dizziness / Vertigo) 270 tablet 1   metoprolol tartrate (LOPRESSOR) 50 MG tablet TAKE 1 TABLET BY MOUTH TWICE DAILY FOR BLOOD PRESSURE AND FOR HEART 180 tablet 1   Multiple Vitamins-Minerals (MULTIVITAMIN WITH MINERALS) tablet Take 1 tablet by mouth  daily.     nitroGLYCERIN (NITROSTAT) 0.4 MG SL tablet Place 1 tablet (0.4 mg total) under the tongue every 5 (five) minutes as needed for chest pain. 90 tablet 3   omeprazole (PRILOSEC) 40 MG capsule Take 1 capsule  2 x /day  to Prevent Indigestion & Heartburn 180 capsule 3   prochlorperazine (COMPAZINE) 5 MG tablet Take 1 tablet (5 mg total) by mouth every 6 (six) hours as needed for refractory nausea / vomiting. 10 tablet 0   promethazine (PHENERGAN) 25 MG tablet TAKE 1 TABLET BY MOUTH EVERY 4 HOURS AS NEEDED FOR NAUSEA AND VOMITING 50 tablet 0   rOPINIRole (REQUIP) 2 MG tablet Take  1 tablet  3 x /day  for Restless [place] 270 tablet 3   senna-docusate (SENOKOT-S) 8.6-50 MG tablet Take 2 tablets by mouth 2 (two) times daily. 60 tablet 1   sucralfate (CARAFATE) 1 g tablet TAKE 1 TABLET BY MOUTH 4 TIMES DAILY BEFORE MEAL(S) AND AT BEDTIME FOR INDIGESTION AND FOR  HEARTBURN 360 tablet 3   trospium (SANCTURA) 20 MG tablet Take 20 mg by mouth 2 (two) times daily.     vitamin B-12 (CYANOCOBALAMIN) 1000 MCG tablet Take 1,000 mcg by mouth daily.     Current Facility-Administered Medications on File Prior to Visit  Medication Dose Route Frequency Provider Last Rate Last Admin   calcitonin (salmon) (MIACALCIN/FORTICAL) nasal spray 1 spray  1 spray Alternating Nares Daily Jessy Oto, MD        Current Problems (verified) Patient Active Problem List   Diagnosis Date Noted   History of adenomatous polyp of colon 07/21/2020   Atypical chest pain 07/08/2020   History of fundoplication    Epigastric pain 07/09/2019   Senile purpura ([place]) 07/05/2019   Abnormal glucose (prediabetes) 03/01/2018   FHx: heart disease 03/01/2018   CKD (chronic kidney disease) stage 2, GFR 60-89 ml/min 09/27/2017   T12 compression fracture ([place]) 09/26/2017   Chronic bronchitis ([place]) 08/09/2017   Atherosclerosis of aorta ([place]) 04/05/2017   Odynophagia    Spinal stenosis, lumbar region, with neurogenic claudication 05/29/2015   DDD (degenerative disc disease), lumbar 04/21/2015   Hyperlipidemia, mixed 11/24/2014   Gastroesophageal reflux disease 11/24/2014   Depression, major, in partial remission ([place]) 09/26/2014   Chronic pain syndrome 09/26/2014   Coronary artery disease involving native heart without angina pectoris    Osteoporosis 02/10/2014   Vitamin D deficiency 08/14/2013   MGUS (monoclonal gammopathy of unknown significance) 03/05/2013   ESOPHAGEAL STRICTURE 08/28/2008   Mild intermittent asthma without complication 93/81/0175   Incarcerated recurrent paraesophageal hiatal hernia s/p lap redo repair [id number] 01/31/2008   Essential hypertension 07/25/2007    Screening Tests Immunization History  Administered Date(s) Administered   DT (Pediatric) 05/06/2014   Influenza Split 02/07/2011   Influenza Whole 02/09/2009, 01/19/2010, 02/13/2012   Influenza, High  Dose Seasonal PF 03/22/2013, 01/15/2014, 02/27/2015, 01/14/2016, 01/18/2017, 02/14/2018, 02/14/2019, 03/16/2020   PFIZER(Purple Top)SARS-COV-2 Vaccination 05/25/2019, 06/15/2019, 03/24/2020   Pneumococcal Conjugate-13 05/06/2014   Pneumococcal Polysaccharide-23 02/09/2011   Pneumococcal-Unspecified 02/06/2010, 02/09/2012   Td 05/10/2003    Preventative care: Last colonoscopy: 05/2017, adenomatous polyp, Dr. Havery Moros, 3 year recall DUE - patient has been contacted EGD 07/2019, candida esophagitis Last mammogram: 09/2020 Last pap smear/pelvic exam: remote,declines   DEXA 11/2018 L fem T -2.6, continue prolia per Dr. Estanislado Pandy, has number to schedule  Myocardial perfusion 09/2014, previous MI, low risk study Echo 08/2014, EF 55-60%   Prior vaccinations: TD or Tdap: 04/2014  Influenza:  03/2020  Pneumococcal: 2012 Prevnar13:  2015 Shingles/Zostavax: declines due to cost Covid 19: 3/3, 2021, pfizer  Names of Other Physician/Practitioners you currently use: 1. [facility]- here for primary care 2. Dr. Einar Gip, eye doctor, last visit 2022 3. No dentist, dentures.   4. Derm Dr. Pearline Cables, goes annually, has upcoming appointment.   Patient Care Team: [name], MD as PCP - General (Internal Medicine) Jerline Pain, MD as PCP - Cardiology (Cardiology) Inda Castle, MD (Inactive) as Consulting Physician (Gastroenterology) Rigoberto Noel, MD as Consulting Physician (Pulmonary Disease) Larey Dresser, MD as Consulting Physician (Cardiology) Michael Boston, MD as Consulting Physician (General Surgery) Jessy Oto, MD as Consulting Physician (Orthopedic Surgery) Philemon Kingdom, MD as Consulting Physician (Endocrinology) Curt Bears, MD as Consulting Physician (Oncology)  Allergies Allergies  Allergen Reactions   Ace Inhibitors Cough    Per Dr Melvyn Novas pulmonology 2013   Aspartame Diarrhea and Nausea And Vomiting   Atorvastatin  Other (See Comments)    Muscle aches   Biaxin [Clarithromycin] Other (See Comments)    Unknown reaction : PATIENT CAN TOLERATE Z-PAK.  Is written on patient's paper chart.   Daliresp [Roflumilast] Nausea And Vomiting   Erythromycin Other (See Comments)    Unknown reaction: PATIENT CAN TOLERATE Z-PAK.  It is written on patient's paper chart.   Levofloxacin Nausea And Vomiting    SURGICAL HISTORY She  has a past surgical history that includes Back surgery; Bladder repair; Hemorrhoid surgery; Cholecystectomy; Appendectomy; Total abdominal hysterectomy (1975); Eye surgery (11/2000); Hiatal hernia repair (06/03/2011); Laparoscopic Nissen fundoplication (0/25/8527); Lumbar laminectomy/decompression microdiscectomy (Right, 06/26/2012); Esophagogastroduodenoscopy (N/A, 03/15/2013); Esophageal manometry (N/A, 03/25/2013); Hiatal hernia repair (N/A, 04/24/2013); Insertion of mesh (N/A, 04/24/2013); Laparoscopic lysis of adhesions (N/A, 04/24/2013); Fixation kyphoplasty lumbar spine (X 2); Coronary angioplasty (11/16/2001); Lumbar laminectomy/decompression microdiscectomy (N/A, 05/29/2015); Lumbar fusion (12/07/2015); Esophagogastroduodenoscopy (N/A, 12/25/2015); IR KYPHO THORACIC WITH BONE BIOPSY (09/29/2017); IR KYPHO THORACIC WITH BONE BIOPSY (11/03/2017); Upper gastrointestinal endoscopy; IR KYPHO THORACIC WITH BONE BIOPSY (02/14/2019); and IR KYPHO THORACIC WITH BONE BIOPSY (04/02/2019). FAMILY HISTORY Her family history includes Brain cancer in her brother; Colon cancer (age of onset: 20) in her mother; Heart attack (age of onset: 4) in her father; Heart disease in her brother and brother; Kidney disease in her daughter. SOCIAL HISTORY She  reports that she has never smoked. She has never used smokeless tobacco. She reports that she does not drink alcohol and does not use drugs.    Review of Systems  Constitutional:  Negative for malaise/fatigue and weight loss.  HENT:  Negative for hearing loss and  tinnitus.   Eyes:  Negative for blurred vision and double vision.  Respiratory:  Positive for cough and sputum production (several months, saw pulm yesterday). Negative for shortness of breath and wheezing.   Cardiovascular:  Negative for chest pain, palpitations, orthopnea, claudication and leg swelling.  Gastrointestinal:  Positive for nausea (mild, intermittent in AM). Negative for abdominal pain, blood in stool, constipation, diarrhea, heartburn, melena and vomiting.  Genitourinary: Negative.  Negative for dysuria, flank pain, frequency (improved with medication), hematuria and urgency.  Musculoskeletal:  Positive for back pain. Negative for joint pain and myalgias.  Skin:  Negative for rash.  Neurological:  Negative for dizziness, tingling, sensory change, weakness and headaches.  Endo/Heme/Allergies:  Negative for polydipsia.  Psychiatric/Behavioral: Negative.    All other systems reviewed and are negative.   Objective:   Today's Vitals   10/15/20 0842  BP: 116/64  Pulse: 73  Temp: (!) 97.3 F (36.3 C)  SpO2: 97%  Weight: 151 lb (68.5 kg)  Height: 5' 2.5" (1.588 m)   Body mass index is 27.18 kg/m.  General appearance: alert, no distress, WD/WN,  female HEENT: normocephalic, sclerae anicteric, TMs pearly, nares patent, no discharge or erythema, pharynx normal Oral cavity: MMM, no lesions Neck: supple, no lymphadenopathy, no thyromegaly, no masses Heart: RRR, normal S1, S2, no murmurs Lungs: CTA bilaterally, no wheezes, rhonchi. She does have some coarse bronchial sounds bilaterally and rales scattered. Abdomen: +bs, rounded,, soft, non tender, no palpable masses, no hepatomegaly, no splenomegaly Musculoskeletal: nontender, no swelling, no obvious deformity. Extremities: no edema, no cyanosis, no clubbing. She does have scattered small ecchymoses Pulses: 2+ symmetric, upper and lower extremities, normal cap refill Neurological: alert, oriented x 3, CN2-12 intact, strength  4/5 upper extremities and lower extremities, DTRs 2+ throughout, no cerebellar signs, gait slow steady with cane Psychiatric: normal affect, behavior normal, pleasant  GU: denies concerns, defer Breasts: just had normal mammogram, no concerns, defer  EKG: NSR, NSCPT  The patient's weight, height, BMI, and visual acuity have been recorded in the chart.  I have made referrals, counseling, and provided education to the patient based on review of the above and I have provided the patient with a written personalized care plan for preventive services.     Izora Ribas, NP   10/15/2020

## 2020-10-14 NOTE — Assessment & Plan Note (Signed)
09/08/2017  After extensive coaching inhaler device  effectiveness =    75% > continue dulera 100 2 bid prn  - Spirometry 09/08/2017  wnl with min curvature off rx  - PFT's  09/02/2020  FEV1 1.38 (73 % ) ratio 0.77  p 11 % improvement from saba p advair 250  prior to study with DLCO  19.41 (103%) corrects to 5.38 (131%)  for alv volume and FV curve minimally concave     She still intermittently coughs and reports this was less bothersome on hfa but apparently can't afford it so rec try advair 100 /50 one bid to see if gets same benefit s the potential side effects of higher doses of ICS by dpi  (more cough) .  >>> f/u 6 weeks         Each maintenance medication was reviewed in detail including emphasizing most importantly the difference between maintenance and prns and under what circumstances the prns are to be triggered using an action plan format where appropriate.  Total time for H and P, chart review, counseling, reviewing dpi device(s) and generating customized AVS unique to this office visit / same day charting = 30 min

## 2020-10-14 NOTE — Assessment & Plan Note (Signed)
09/08/2017  After extensive coaching inhaler device  effectiveness =    75% > continue dulera 100 2 bid prn  - Spirometry 09/08/2017  wnl with min curvature off rx  - PFT's  09/02/2020  FEV1 1.38 (73 % ) ratio 0.77  p 11 % improvement from saba p advair 250  prior to study with DLCO  19.41 (103%) corrects to 5.38 (131%)  for alv volume and FV curve minimally concave  - 10/14/2020  After extensive coaching inhaler device,  effectiveness =    75% so try symbicort 80 2 bid    Hard to tell how much is true asthma vs side effects from high doses of adviar but worth trying a "less is more" approach with low dose symbicort eg 80 2bid and prn saba  Re saba: I spent extra time with pt today reviewing appropriate use of albuterol for prn use on exertion with the following points: 1) saba is for relief of sob that does not improve by walking a slower pace or resting but rather if the pt does not improve after trying this first. 2) If the pt is convinced, as many are, that saba helps recover from activity faster then it's easy to tell if this is the case by re-challenging : ie stop, take the inhaler, then p 5 minutes try the exact same activity (intensity of workload) that just caused the symptoms and see if they are substantially diminished or not after saba 3) if there is an activity that reproducibly causes the symptoms, try the saba 15 min before the activity on alternate days   If in fact the saba really does help, then fine to continue to use it prn but advised may need to look closer at the maintenance regimen being used to achieve better control of airways disease with exertion.          Each maintenance medication was reviewed in detail including emphasizing most importantly the difference between maintenance and prns and under what circumstances the prns are to be triggered using an action plan format where appropriate.  Total time for H and P, chart review, counseling, reviewing hfa and prn vs maint   device(s) and generating customized AVS unique to this office visit / same day charting > 30 min

## 2020-10-14 NOTE — Patient Instructions (Addendum)
Plan A = Automatic = Always=    dulera 100 (or Symbicort 80) Take 2 puffs first thing in am and then another 2 puffs about 12 hours later.   Work on inhaler technique:  relax and gently blow all the way out then take a nice smooth full deep breath back in, triggering the inhaler at same time you start breathing in.  Hold for up to 5 seconds if you can. Blow out thru nose. Rinse and gargle with water when done.  If mouth or throat bother you at all,  try brushing teeth/gums/tongue with arm and hammer toothpaste/ make a slurry and gargle and spit out.     Plan B = Backup (to supplement plan A, not to replace it) Only use your albuterol inhaler as a rescue medication to be used if you can't catch your breath by resting or doing a relaxed purse lip breathing pattern.  - The less you use it, the better it will work when you need it. - Ok to use the inhaler up to 2 puffs  every 4 hours if you must but call for appointment if use goes up over your usual need - Don't leave home without it !!  (think of it like the spare tire for your car)    Please schedule a follow up visit in 3 months but call sooner if needed

## 2020-10-15 ENCOUNTER — Encounter: Payer: Self-pay | Admitting: Adult Health

## 2020-10-15 ENCOUNTER — Other Ambulatory Visit: Payer: Self-pay

## 2020-10-15 ENCOUNTER — Ambulatory Visit (INDEPENDENT_AMBULATORY_CARE_PROVIDER_SITE_OTHER): Payer: PPO | Admitting: Adult Health

## 2020-10-15 ENCOUNTER — Encounter: Payer: PPO | Admitting: Adult Health Nurse Practitioner

## 2020-10-15 VITALS — BP 116/64 | HR 73 | Temp 97.3°F | Ht 62.5 in | Wt 151.0 lb

## 2020-10-15 DIAGNOSIS — D649 Anemia, unspecified: Secondary | ICD-10-CM | POA: Diagnosis not present

## 2020-10-15 DIAGNOSIS — I10 Essential (primary) hypertension: Secondary | ICD-10-CM | POA: Diagnosis not present

## 2020-10-15 DIAGNOSIS — D692 Other nonthrombocytopenic purpura: Secondary | ICD-10-CM

## 2020-10-15 DIAGNOSIS — I700 Atherosclerosis of aorta: Secondary | ICD-10-CM

## 2020-10-15 DIAGNOSIS — Z0000 Encounter for general adult medical examination without abnormal findings: Secondary | ICD-10-CM

## 2020-10-15 DIAGNOSIS — Z0001 Encounter for general adult medical examination with abnormal findings: Secondary | ICD-10-CM

## 2020-10-15 DIAGNOSIS — E559 Vitamin D deficiency, unspecified: Secondary | ICD-10-CM | POA: Diagnosis not present

## 2020-10-15 DIAGNOSIS — G894 Chronic pain syndrome: Secondary | ICD-10-CM

## 2020-10-15 DIAGNOSIS — Z131 Encounter for screening for diabetes mellitus: Secondary | ICD-10-CM | POA: Diagnosis not present

## 2020-10-15 DIAGNOSIS — I2510 Atherosclerotic heart disease of native coronary artery without angina pectoris: Secondary | ICD-10-CM

## 2020-10-15 DIAGNOSIS — F3341 Major depressive disorder, recurrent, in partial remission: Secondary | ICD-10-CM

## 2020-10-15 DIAGNOSIS — Z1389 Encounter for screening for other disorder: Secondary | ICD-10-CM | POA: Diagnosis not present

## 2020-10-15 DIAGNOSIS — Z860101 Personal history of adenomatous and serrated colon polyps: Secondary | ICD-10-CM

## 2020-10-15 DIAGNOSIS — Z136 Encounter for screening for cardiovascular disorders: Secondary | ICD-10-CM

## 2020-10-15 DIAGNOSIS — N182 Chronic kidney disease, stage 2 (mild): Secondary | ICD-10-CM | POA: Diagnosis not present

## 2020-10-15 DIAGNOSIS — E663 Overweight: Secondary | ICD-10-CM

## 2020-10-15 DIAGNOSIS — R7309 Other abnormal glucose: Secondary | ICD-10-CM | POA: Diagnosis not present

## 2020-10-15 DIAGNOSIS — Z86010 Personal history of colonic polyps: Secondary | ICD-10-CM

## 2020-10-15 DIAGNOSIS — D472 Monoclonal gammopathy: Secondary | ICD-10-CM

## 2020-10-15 DIAGNOSIS — E782 Mixed hyperlipidemia: Secondary | ICD-10-CM

## 2020-10-15 DIAGNOSIS — K219 Gastro-esophageal reflux disease without esophagitis: Secondary | ICD-10-CM

## 2020-10-15 DIAGNOSIS — J4520 Mild intermittent asthma, uncomplicated: Secondary | ICD-10-CM

## 2020-10-15 DIAGNOSIS — M8000XS Age-related osteoporosis with current pathological fracture, unspecified site, sequela: Secondary | ICD-10-CM

## 2020-10-15 DIAGNOSIS — Z8249 Family history of ischemic heart disease and other diseases of the circulatory system: Secondary | ICD-10-CM | POA: Diagnosis not present

## 2020-10-15 DIAGNOSIS — J42 Unspecified chronic bronchitis: Secondary | ICD-10-CM

## 2020-10-15 NOTE — Patient Instructions (Addendum)
  [name] [name] , Thank you for taking time to come for your AnnualWellness Visit. I appreciate your ongoing commitment to your health goals. Please review the following plan we discussed and let me know if I can assist you in the future.   These are the goals we discussed:  Goals      DIET - INCREASE WATER INTAKE     4-5 bottles daily       Exercise 150 min/wk Moderate Activity     LDL CALC < 100        This is a list of the screening recommended for you and due dates:  Health Maintenance  Topic Date Due   Colon Cancer Screening  05/17/2020   COVID-19 Vaccine (4 - Booster for Pfizer series) 06/24/2020   Zoster (Shingles) Vaccine (1 of 2) 01/15/2021*   Flu Shot  12/07/2020   Tetanus Vaccine  04/24/2024   DEXA scan (bone density measurement)  Completed   Pneumonia vaccines  Completed   HPV Vaccine  Aged Out   Pneumococcal Vaccination  Discontinued  *Topic was postponed. The date shown is not the original due date.    Please call Dr. Havery Moros to schedule colonoscopy  Try ginger tea or warm water with lemon in the morning  Try oatmeal and fruit/nuts instead of eggs  Try to increase whole grains, beans, nuts in diet    HOW TO [place]  7 a.m.-6:30 p.m., Monday 7 a.m.-5 p.m., Tuesday-Friday Schedule an appointment by calling [phone].

## 2020-10-16 ENCOUNTER — Ambulatory Visit: Payer: PPO | Admitting: Internal Medicine

## 2020-10-16 ENCOUNTER — Encounter: Payer: Self-pay | Admitting: Adult Health

## 2020-10-16 DIAGNOSIS — E611 Iron deficiency: Secondary | ICD-10-CM | POA: Insufficient documentation

## 2020-10-16 LAB — LIPID PANEL
Cholesterol: 148 mg/dL (ref <200)
HDL: 57 mg/dL (ref >=50)
LDL Cholesterol (Calc): 75 mg/dL (calc)
Non-HDL Cholesterol (Calc): 91 mg/dL (calc) (ref <130)
Total CHOL/HDL Ratio: 2.6 (calc) (ref <5.0)
Triglycerides: 75 mg/dL (ref <150)

## 2020-10-16 LAB — URINALYSIS, ROUTINE W REFLEX MICROSCOPIC
Bilirubin Urine: NEGATIVE
Glucose, UA: NEGATIVE
Hgb urine dipstick: NEGATIVE
Ketones, ur: NEGATIVE
Leukocytes,Ua: NEGATIVE
Nitrite: NEGATIVE
Protein, ur: NEGATIVE
Specific Gravity, Urine: 1.022 (ref 1.001–1.035)
pH: 7 (ref 5.0–8.0)

## 2020-10-16 LAB — CBC WITH DIFFERENTIAL/PLATELET
Absolute Monocytes: 593 cells/uL (ref 200–950)
Basophils Absolute: 62 cells/uL (ref 0–200)
Basophils Relative: 0.8 %
Eosinophils Absolute: 694 cells/uL — ABNORMAL HIGH (ref 15–500)
Eosinophils Relative: 8.9 %
HCT: 42.6 % (ref 35.0–45.0)
Hemoglobin: 14.5 g/dL (ref 11.7–15.5)
Lymphs Abs: 2075 cells/uL (ref 850–3900)
MCH: 32 pg (ref 27.0–33.0)
MCHC: 34 g/dL (ref 32.0–36.0)
MCV: 94 fL (ref 80.0–100.0)
MPV: 10.5 fL (ref 7.5–12.5)
Monocytes Relative: 7.6 %
Neutro Abs: 4376 cells/uL (ref 1500–7800)
Neutrophils Relative %: 56.1 %
Platelets: 341 10*3/uL (ref 140–400)
RBC: 4.53 10*6/uL (ref 3.80–5.10)
RDW: 12.9 % (ref 11.0–15.0)
Total Lymphocyte: 26.6 %
WBC: 7.8 10*3/uL (ref 3.8–10.8)

## 2020-10-16 LAB — COMPLETE METABOLIC PANEL WITH GFR
AG Ratio: 1.6 (calc) (ref 1.0–2.5)
ALT: 13 U/L (ref 6–29)
AST: 15 U/L (ref 10–35)
Albumin: 4.2 g/dL (ref 3.6–5.1)
Alkaline phosphatase (APISO): 60 U/L (ref 37–153)
BUN: 19 mg/dL (ref 7–25)
CO2: 25 mmol/L (ref 20–32)
Calcium: 9.5 mg/dL (ref 8.6–10.4)
Chloride: 104 mmol/L (ref 98–110)
Creat: 0.88 mg/dL (ref 0.60–0.88)
GFR, Est African American: 71 mL/min/{1.73_m2} (ref >=60)
GFR, Est Non African American: 61 mL/min/{1.73_m2} (ref >=60)
Globulin: 2.6 g/dL (calc) (ref 1.9–3.7)
Glucose, Bld: 102 mg/dL — ABNORMAL HIGH (ref 65–99)
Potassium: 4.2 mmol/L (ref 3.5–5.3)
Sodium: 138 mmol/L (ref 135–146)
Total Bilirubin: 0.6 mg/dL (ref 0.2–1.2)
Total Protein: 6.8 g/dL (ref 6.1–8.1)

## 2020-10-16 LAB — IRON,TIBC AND FERRITIN PANEL
%SAT: 30 % (calc) (ref 16–45)
Ferritin: 15 ng/mL — ABNORMAL LOW (ref 16–288)
Iron: 112 ug/dL (ref 45–160)
TIBC: 369 mcg/dL (calc) (ref 250–450)

## 2020-10-16 LAB — MAGNESIUM: Magnesium: 2.2 mg/dL (ref 1.5–2.5)

## 2020-10-16 LAB — TSH: TSH: 0.75 mIU/L (ref 0.40–4.50)

## 2020-10-16 LAB — HEMOGLOBIN A1C
Hgb A1c MFr Bld: 5.9 % of total Hgb — ABNORMAL HIGH (ref <5.7)
Mean Plasma Glucose: 123 mg/dL
eAG (mmol/L): 6.8 mmol/L

## 2020-10-16 LAB — MICROALBUMIN / CREATININE URINE RATIO
Creatinine, Urine: 110 mg/dL (ref 20–275)
Microalb Creat Ratio: 8 mcg/mg creat (ref <30)
Microalb, Ur: 0.9 mg/dL

## 2020-10-16 LAB — VITAMIN D 25 HYDROXY (VIT D DEFICIENCY, FRACTURES): Vit D, 25-Hydroxy: 97 ng/mL (ref 30–100)

## 2020-10-16 LAB — VITAMIN B12: Vitamin B-12: 772 pg/mL (ref 200–1100)

## 2020-11-03 DIAGNOSIS — L720 Epidermal cyst: Secondary | ICD-10-CM | POA: Diagnosis not present

## 2020-11-13 ENCOUNTER — Other Ambulatory Visit: Payer: Self-pay | Admitting: Adult Health

## 2020-11-17 ENCOUNTER — Other Ambulatory Visit: Payer: Self-pay | Admitting: Nurse Practitioner

## 2020-11-17 DIAGNOSIS — R450 Nervousness: Secondary | ICD-10-CM

## 2020-11-17 MED ORDER — ALPRAZOLAM 0.5 MG PO TABS: 0.2500 mg | ORAL_TABLET | Freq: Every day | ORAL | 0 refills | Status: DC | PRN

## 2020-11-17 NOTE — Progress Notes (Signed)
Last refill was from the hospital 01/01/20

## 2020-11-20 ENCOUNTER — Other Ambulatory Visit: Payer: Self-pay

## 2020-11-20 MED ORDER — AMLODIPINE BESYLATE 10 MG PO TABS: ORAL_TABLET | ORAL | 3 refills | Status: DC

## 2020-12-09 ENCOUNTER — Telehealth: Payer: Self-pay | Admitting: Internal Medicine

## 2020-12-09 NOTE — Chronic Care Management (AMB) (Signed)
  Chronic Care Management   Note  12/09/2020 Name: [name] [name] MRN: [id number] DOB: 1935-11-18  [name] is a 85 y.o. year old female who is a primary care patient of [name], MD. I reached out to Jonna Coup by phone today in response to a referral sent by [name]'s PCP, [name], MD.   [name] was given information about Chronic Care Management services today including:  CCM service includes personalized support from designated clinical staff supervised by her physician, including individualized plan of care and coordination with other care providers 24/7 contact phone numbers for assistance for urgent and routine care needs. Service will only be billed when office clinical staff spend 20 minutes or more in a month to coordinate care. Only one practitioner may furnish and bill the service in a calendar month. The patient may stop CCM services at any time (effective at the end of the month) by phone call to the office staff.   Patient agreed to services and verbal consent obtained.   Follow up plan:   Tatjana Secretary/administrator

## 2020-12-18 ENCOUNTER — Other Ambulatory Visit: Payer: Self-pay

## 2020-12-18 ENCOUNTER — Ambulatory Visit (INDEPENDENT_AMBULATORY_CARE_PROVIDER_SITE_OTHER): Payer: PPO | Admitting: Specialist

## 2020-12-18 ENCOUNTER — Ambulatory Visit: Payer: Self-pay

## 2020-12-18 ENCOUNTER — Encounter: Payer: Self-pay | Admitting: Specialist

## 2020-12-18 VITALS — BP 152/82 | HR 82 | Ht 62.5 in | Wt 151.0 lb

## 2020-12-18 DIAGNOSIS — M5442 Lumbago with sciatica, left side: Secondary | ICD-10-CM | POA: Diagnosis not present

## 2020-12-18 DIAGNOSIS — M4726 Other spondylosis with radiculopathy, lumbar region: Secondary | ICD-10-CM | POA: Diagnosis not present

## 2020-12-18 DIAGNOSIS — M5441 Lumbago with sciatica, right side: Secondary | ICD-10-CM

## 2020-12-18 DIAGNOSIS — Z4889 Encounter for other specified surgical aftercare: Secondary | ICD-10-CM | POA: Diagnosis not present

## 2020-12-18 DIAGNOSIS — M25561 Pain in right knee: Secondary | ICD-10-CM | POA: Diagnosis not present

## 2020-12-18 DIAGNOSIS — G8929 Other chronic pain: Secondary | ICD-10-CM

## 2020-12-18 DIAGNOSIS — M4326 Fusion of spine, lumbar region: Secondary | ICD-10-CM | POA: Diagnosis not present

## 2020-12-18 DIAGNOSIS — M4807 Spinal stenosis, lumbosacral region: Secondary | ICD-10-CM

## 2020-12-18 MED ORDER — BUPIVACAINE HCL 0.5 % IJ SOLN
5.0000 mL | INTRAMUSCULAR | Status: AC | PRN
  Administered 2020-12-18: 5 mL via INTRA_ARTICULAR

## 2020-12-18 MED ORDER — METHYLPREDNISOLONE ACETATE 40 MG/ML IJ SUSP
40.0000 mg | INTRAMUSCULAR | Status: AC | PRN
  Administered 2020-12-18: 40 mg via INTRA_ARTICULAR

## 2020-12-18 NOTE — Progress Notes (Signed)
Office Visit Note   Patient: [name] [name]           Date of Birth: 1935/11/21           MRN: [id number] Visit Date: 12/18/2020              Requested by: [name], [address] [place],  [place] [id number] PCP: [name], MD   Assessment & Plan: Visit Diagnoses:  1. Chronic bilateral low back pain with bilateral sciatica   2. Encounter for other specified surgical aftercare   3. Spinal stenosis of lumbosacral region   4. Acute pain of right knee   5. Fusion of lumbar spine   6. Other spondylosis with radiculopathy, lumbar region     Plan: Knee is suffering from osteoarthritis, only real proven treatments are Weight loss, NSIADs like diclofenac gel and oral NSAIDs and exercise. Well padded shoes help. Ice the knee  Ice the knee 2-3 times a day 15-20 mins at a time. 3 times a day 15-20 mins at a time. Hot showers in the AM.  Injection with steroid may be of benefit. Hemp CBD capsules, amazon.com 5,000-7,000 mg per bottle, 60 capsules per bottle, take one capsule twice a day. Cane in the left hand to use with left leg weight bearing. Follow-Up Instructions: No follow-ups on file.    Follow-Up Instructions: Return in about 4 weeks (around 01/15/2021).   Orders:  Orders Placed This Encounter  Procedures   Large Joint Inj: R knee   XR Lumbar Spine 2-3 Views   Ambulatory referral to Physical Therapy   No orders of the defined types were placed in this encounter.     Procedures: Large Joint Inj: R knee on 12/18/2020 11:19 AM Indications: pain Details: 25 G 1.5 in needle, anterolateral approach  Arthrogram: No  Medications: 40 mg methylPREDNISolone acetate 40 MG/ML; 5 mL bupivacaine 0.5 % Outcome: tolerated well, no immediate complications Procedure, treatment alternatives, risks and benefits explained, specific risks discussed. Consent was given by the patient. Immediately prior to procedure a time out was called to verify the correct  patient, procedure, equipment, support staff and site/side marked as required. Patient was prepped and draped in the usual sterile fashion.      Clinical Data: No additional findings.   Subjective: Chief Complaint  Patient presents with   Right Leg - Pain    85 year old female with history of lumbar disc herniation and spondylolisthesis L4-5 had a discectomy and did well then progressive  Pain and underwent fusion and also developed osteoporotic compression fractures with mild scoliosis causing foramenal stenosis and her fusion was extended up to L1 and site of previously autofused fracture at the thoracolumbar fusion. She is having night pain and pain into the right knee. The pain is worse with standing and walking. Feels better sitting and stooping. She tends to stoop with standing and walking and is able to grocery shop with leaning on the grocery cart. The right leg sometimes weak, the right knee is numb and has at times paresthesias. No previous right knee surgery. No bowel or bladder dysfunction, taking requip for bladder discomfort, Dr. Jeffie Pollock urology. She is having night pain and spasm in the lower lumbar area transversely and the buttocks both sides and spasm that runs down the right leg.   Review of Systems  Constitutional: Negative.   HENT: Negative.    Eyes: Negative.   Respiratory: Negative.    Cardiovascular: Negative.   Gastrointestinal:  Negative.   Endocrine: Negative.   Genitourinary: Negative.   Musculoskeletal: Negative.   Skin: Negative.   Allergic/Immunologic: Negative.   Neurological: Negative.   Hematological: Negative.   Psychiatric/Behavioral: Negative.      Objective: Vital Signs: BP (!) 152/82   Pulse 82   Ht 5' 2.5" (1.588 m)   Wt 151 lb (68.5 kg)   BMI 27.18 kg/m   Physical Exam Constitutional:      Appearance: She is well-developed.  HENT:     Head: Normocephalic and atraumatic.  Eyes:     Pupils: Pupils are equal, round, and reactive to  light.  Pulmonary:     Effort: Pulmonary effort is normal.     Breath sounds: Normal breath sounds.  Abdominal:     General: Bowel sounds are normal.     Palpations: Abdomen is soft.  Musculoskeletal:        General: Normal range of motion.     Cervical back: Normal range of motion and neck supple.  Skin:    General: Skin is warm and dry.  Neurological:     Mental Status: She is alert and oriented to person, place, and time.  Psychiatric:        Behavior: Behavior normal.        Thought Content: Thought content normal.        Judgment: Judgment normal.   Ortho Exam  Specialty Comments:  No specialty comments available.  Imaging: No results found.   PMFS History: Patient Active Problem List   Diagnosis Date Noted   Iron deficiency 10/16/2020   History of adenomatous polyp of colon 07/21/2020   Atypical chest pain 07/08/2020   History of fundoplication    Epigastric pain 07/09/2019   Senile purpura ([place]) 07/05/2019   Abnormal glucose (prediabetes) 03/01/2018   FHx: heart disease 03/01/2018   CKD (chronic kidney disease) stage 2, GFR 60-89 ml/min 09/27/2017   T12 compression fracture ([place]) 09/26/2017   Chronic bronchitis ([place]) 08/09/2017   Atherosclerosis of aorta ([place]) 04/05/2017   Odynophagia    Spinal stenosis, lumbar region, with neurogenic claudication 05/29/2015   DDD (degenerative disc disease), lumbar 04/21/2015   Hyperlipidemia, mixed 11/24/2014   Gastroesophageal reflux disease 11/24/2014   Depression, major, in partial remission ([place]) 09/26/2014   Chronic pain syndrome 09/26/2014   Coronary artery disease involving native heart without angina pectoris    Osteoporosis 02/10/2014   Vitamin D deficiency 08/14/2013   MGUS (monoclonal gammopathy of unknown significance) 03/05/2013   ESOPHAGEAL STRICTURE 08/28/2008   Mild intermittent asthma without complication [id number]   Incarcerated recurrent paraesophageal hiatal hernia s/p lap redo repair [id number]  01/31/2008   Essential hypertension 07/25/2007   Past Medical History:  Diagnosis Date   Anxiety    takes Xanax daily as needed   Asthma    Albuterol daily as needed   Blood transfusion without reported diagnosis    CAD (coronary artery disease)    LHC 2003 with 40% pLAD, 30% mLAD, 100% D1 (moderate vessel), 100%  D2 (small vessel), 95% mid small OM2.  Pt had PTCA to D1;  D2 and OM2 small vessels and tx medically;  Last Myoview (2012): anterior infarct seen with scar, no ischemia. EF normal. Med Rx // Myoview (12/14):  Low risk; ant scar w/ peri-infarct ischemia; EF 55% with ant AK // Myoview 5/16: EF 55-65, ant, apical scar, no ischemia    Cataract    Chronic back pain    HNP, DDD, spinal stenosis, vertebral compression fractures.  Chronic nausea    takes Zofran daily as needed.  normal gastric emptying study 03/2013   CKD (chronic kidney disease), stage III ([place]) 09/27/2017   Constipation    felt to be functional. takes Senokot and Miralax daily.  treated with Linzess in past.    DIVERTICULOSIS, COLON 08/28/2008   Qualifier: Diagnosis of  By: [name], [name], [name]     Elevated LFTs 2015   probably med induced DILI, from Augmentin vs Pravastatin   GERD (gastroesophageal reflux disease)    hx of esophageal stricture    Hiatal hernia    Paraesophageal hernias. s/p fundoplications in 0000000 and 04/2013   History of bronchitis several of yrs ago   History of colon polyps 03/2009, 03/2011   adenomatous colon polyps, no high grade dysplasia.    History of vertigo    no meds   Hyperlipidemia    takes Pravastatin daily   Hypertension    takes Amlodipine,Metoprolol,and Losartan daily   Multiple closed pelvic fractures without disruption of pelvic circle (HCC)    Nausea 03/2016   Osteoporosis    PONV (postoperative nausea and vomiting)    yrs ago   Pubic ramus fracture (HCC) 12/20/2019   Slow urinary stream    takes Rapaflo daily   TIA (transient ischemic attack) 1995   no residual  effects noted   Vertebral compression fracture ([place]) 06/23/2012    Family History  Problem Relation Age of Onset   Colon cancer Mother 25   Heart attack Father 60       heart attack   Brain cancer Brother        tumor    Heart disease Brother    Heart disease Brother    Kidney disease Daughter    Esophageal cancer Neg Hx    Stomach cancer Neg Hx     Past Surgical History:  Procedure Laterality Date   APPENDECTOMY     patient unsure of date   BACK [place]     Patient unsure of date.   CORONARY ANGIOPLASTY  11/16/2001   1 stent   ESOPHAGEAL MANOMETRY N/A 03/25/2013   Procedure: ESOPHAGEAL MANOMETRY (EM);  Surgeon: Inda Castle, MD;  Location: [facility] ENDOSCOPY;  Service: Endoscopy;  Laterality: N/A;   ESOPHAGOGASTRODUODENOSCOPY N/A 03/15/2013   Procedure: ESOPHAGOGASTRODUODENOSCOPY (EGD);  Surgeon: Jerene Bears, MD;  Location[facility] ENDOSCOPY;  Service: Endoscopy;  Laterality: N/A;   ESOPHAGOGASTRODUODENOSCOPY N/A 12/25/2015   Procedure: ESOPHAGOGASTRODUODENOSCOPY (EGD);  Surgeon: Doran Stabler, MD;  Location: [facility] ENDOSCOPY;  Service: Endoscopy;  Laterality: N/A;   EYE SURGERY  11/2000   bilateral cataracts with lens implant   FIXATION KYPHOPLASTY LUMBAR SPINE  X 2   "L3-4"   HEMORRHOID SURGERY     HIATAL HERNIA REPAIR  06/03/2011   Procedure: LAPAROSCOPIC REPAIR OF HIATAL HERNIA;  Surgeon: Adin Hector, MD;  Location: [facility] ORS;  Service: General;  Laterality: N/A;   HIATAL HERNIA REPAIR N/A 04/24/2013   Procedure: LAPAROSCOPIC  REPAIR RECURRENT PARASOPHAGEAL HIATAL HERNIA WITH FUNDOPLICATION;  Surgeon: Adin Hector, MD;  Location: [facility] OR[facility];  Service: General;  Laterality: N/A;   INSERTION OF MESH N/A 04/24/2013   Procedure: INSERTION OF MESH;  Surgeon: Adin Hector, MD;  Location: [facility] OR[facility];  Service: General;  Laterality: N/A;   IR KYPHO THORACIC WITH BONE BIOPSY  09/29/2017   IR KYPHO THORACIC WITH BONE BIOPSY  11/03/2017   IR KYPHO  THORACIC WITH BONE BIOPSY  02/14/2019   IR KYPHO THORACIC WITH BONE BIOPSY  04/02/2019   LAPAROSCOPIC LYSIS OF ADHESIONS N/A 04/24/2013   Procedure: LAPAROSCOPIC LYSIS OF ADHESIONS;  Surgeon: Adin Hector, MD;  Location: [facility];  Service: General;  Laterality: N/A;   LAPAROSCOPIC NISSEN FUNDOPLICATION  [id number]   Procedure: LAPAROSCOPIC NISSEN FUNDOPLICATION;  Surgeon: Adin Hector, MD;  Location: [facility];  Service: General;  Laterality: N/A;   LUMBAR FUSION  12/07/2015   Right L2-3 facetectomy, posterolateral fusion L2-3 with pedicle screws, rods, local bone graft, Vivigen cancellous chips. Fusion extended to the L1 level with pedicle screws and rods   LUMBAR LAMINECTOMY/DECOMPRESSION MICRODISCECTOMY Right 06/26/2012   Procedure: LUMBAR LAMINECTOMY/DECOMPRESSION MICRODISCECTOMY;  Surgeon: Jessy Oto, MD;  Location: [facility];  Service: Orthopedics;  Laterality: Right;  RIGHT L4-5 MICRODISCECTOMY   LUMBAR LAMINECTOMY/DECOMPRESSION MICRODISCECTOMY N/A 05/29/2015   Procedure: RIGHT L2-3 MICRODISCECTOMY;  Surgeon: Jessy Oto, MD;  [facility];  Service: Orthopedics;  Laterality: N/A;   TOTAL ABDOMINAL HYSTERECTOMY  1975   partial   UPPER GASTROINTESTINAL ENDOSCOPY     Social History   Occupational History   Occupation: Retired    Fish farm manager: RETIRED    Comment: worked at [employer] Use   Smoking status: Never   Smokeless tobacco: Never  Vaping Use   Vaping Use: Never used  Substance and Sexual Activity   Alcohol use: No   Drug use: Never   Sexual activity: Not Currently

## 2020-12-18 NOTE — Patient Instructions (Addendum)
Plan: Knee is suffering from osteoarthritis, only real proven treatments are Weight loss, NSIADs like diclofenac gel and oral NSAIDs and exercise. Well padded shoes help. Ice the knee  Ice the knee 2-3 times a day 15-20 mins at a time. 3 times a day 15-20 mins at a time. Hot showers in the AM.  Injection with steroid may be of benefit. Hemp CBD capsules, amazon.com 5,000-7,000 mg per bottle, 60 capsules per bottle, take one capsule twice a day. Cane in the left hand to use with left leg weight bearing. Follow-Up Instructions: No follow-ups on file.

## 2020-12-24 ENCOUNTER — Encounter: Payer: Self-pay | Admitting: Nurse Practitioner

## 2020-12-24 ENCOUNTER — Ambulatory Visit: Payer: PPO | Admitting: Nurse Practitioner

## 2020-12-24 ENCOUNTER — Other Ambulatory Visit: Payer: Self-pay

## 2020-12-24 VITALS — BP 161/79 | HR 69 | Temp 100.0°F

## 2020-12-24 DIAGNOSIS — U071 COVID-19: Secondary | ICD-10-CM | POA: Diagnosis not present

## 2020-12-24 MED ORDER — PROMETHAZINE-DM 6.25-15 MG/5ML PO SYRP: 5.0000 mL | ORAL_SOLUTION | Freq: Four times a day (QID) | ORAL | 1 refills | Status: DC | PRN

## 2020-12-24 MED ORDER — DEXAMETHASONE 1 MG PO TABS: ORAL_TABLET | ORAL | 0 refills | Status: DC

## 2020-12-24 MED ORDER — DOXYCYCLINE HYCLATE 100 MG PO CAPS: 100.0000 mg | ORAL_CAPSULE | Freq: Two times a day (BID) | ORAL | 0 refills | Status: DC

## 2020-12-24 MED ORDER — BENZONATATE 100 MG PO CAPS: 100.0000 mg | ORAL_CAPSULE | Freq: Three times a day (TID) | ORAL | 0 refills | Status: DC | PRN

## 2020-12-24 NOTE — Progress Notes (Signed)
THIS ENCOUNTER IS A VIRTUAL VISIT DUE TO COVID-19 - PATIENT WAS NOT SEEN IN THE OFFICE.  PATIENT HAS CONSENTED TO VIRTUAL VISIT / TELEMEDICINE VISIT   Virtual Visit via telephone Note  I connected with  [name] [name] on 12/24/2020 by telephone.  I verified that I am speaking with the correct person using two identifiers.    I discussed the limitations of evaluation and management by telemedicine and the availability of in person appointments. The patient expressed understanding and agreed to proceed.  History of Present Illness:  BP (!) 161/79   Pulse 69   Temp 100 F (37.8 C)   SpO2 95%  85 y.o. patient contacted office reporting URI sx . she tested positive by home covid test. OV was conducted by telephone to minimize exposure. This patient was vaccinated for covid 19, last 05/25/19, 06/15/19, 03/24/20  Sx began 1 days ago with congestion, productive cough of yellow mucus, sore throat, no myalgias or joint pain.  Denies fever and chills.  Treatments tried so far: Tylenol no help  Exposures: uncertain   Medications  Current Outpatient Medications (Endocrine & Metabolic):    denosumab (PROLIA) 60 MG/ML SOSY injection, Inject 60 mg into the skin every 6 (six) months.   dexamethasone (DECADRON) 1 MG tablet, Take 3 tabs for 3 days, 2 tabs for 3 days 1 tab for 5 days. Take with food.  Current Facility-Administered Medications (Endocrine & Metabolic):    calcitonin (salmon) (MIACALCIN/FORTICAL) nasal spray 1 spray  Current Outpatient Medications (Cardiovascular):    amLODipine (NORVASC) 10 MG tablet, TAKE 1 TABLET BY MOUTH ONCE DAILY FOR BLOOD PRESSURE AND FOR HEART   metoprolol tartrate (LOPRESSOR) 50 MG tablet, TAKE 1 TABLET BY MOUTH TWICE DAILY FOR BLOOD PRESSURE AND FOR HEART   nitroGLYCERIN (NITROSTAT) 0.4 MG SL tablet, Place 1 tablet (0.4 mg total) under the tongue every 5 (five) minutes as needed for chest pain.   Current Outpatient Medications (Respiratory):    benzonatate  (TESSALON PERLES) 100 MG capsule, Take 1 capsule (100 mg total) by mouth 3 (three) times daily as needed for cough (Max: '600mg'$  per day).   budesonide-formoterol (SYMBICORT) 80-4.5 MCG/ACT inhaler, Take 2 puffs first thing in am and then another 2 puffs about 12 hours later.   promethazine (PHENERGAN) 25 MG tablet, TAKE 1 TABLET BY MOUTH EVERY 4 HOURS AS NEEDED FOR NAUSEA AND VOMITING   promethazine-dextromethorphan (PROMETHAZINE-DM) 6.25-15 MG/5ML syrup, Take 5 mLs by mouth 4 (four) times daily as needed for cough.   Current Outpatient Medications (Analgesics):    aspirin EC 81 MG tablet, Take 81 mg by mouth at bedtime.    Current Outpatient Medications (Hematological):    vitamin B-12 (CYANOCOBALAMIN) 1000 MCG tablet, Take 1,000 mcg by mouth daily.   Current Outpatient Medications (Other):    Cholecalciferol (VITAMIN D3) 5000 units CAPS, Take 5,000 Units by mouth 2 (two) times daily.    cyclobenzaprine (FLEXERIL) 10 MG tablet, Take  1 tablet  3 x /day  as needed for back pain /muscle spasm   diclofenac sodium (VOLTAREN) 1 % GEL, Apply 2 g topically 4 (four) times daily. (Patient taking differently: Apply 2 g topically 4 (four) times daily as needed (pain).)   dicyclomine (BENTYL) 20 MG tablet, Take 1 tablet (20 mg total) by mouth 3 (three) times daily as needed for spasms. Take 1 tablet 3 x  /day before meals for Cramping, Nausea or Diarhea   doxycycline (VIBRAMYCIN) 100 MG capsule, Take 1 capsule (100 mg total)  by mouth 2 (two) times daily for 10 days.   DULoxetine (CYMBALTA) 30 MG capsule, TAKE 1 CAPSULE BY MOUTH ONCE DAILY, FOR MOOD AND CHRONIC PAIN   gabapentin (NEURONTIN) 300 MG capsule, Take 1 capsule (300 mg total) by mouth 4 (four) times daily.   ketoconazole (NIZORAL) 2 % cream, Apply 1 application topically 2 x /day (Patient taking differently: Apply 1 application topically 2 (two) times daily. Apply 1 application topically 2 x /day)   Magnesium 400 MG TABS, Take 400 mg by mouth 2  (two) times daily.    meclizine (ANTIVERT) 25 MG tablet, Take 1/2 to 1 tablet 2 to 3 x /day ONLY if needed  For Dizziness / Vertigo (Patient taking differently: Take 12.5-25 mg by mouth 3 (three) times daily as needed for dizziness. Take 1/2 to 1 tablet 2 to 3 x /day ONLY if needed  For Dizziness / Vertigo)   Multiple Vitamins-Minerals (MULTIVITAMIN WITH MINERALS) tablet, Take 1 tablet by mouth daily.   omeprazole (PRILOSEC) 40 MG capsule, Take 1 capsule  2 x /day  to Prevent Indigestion & Heartburn   prochlorperazine (COMPAZINE) 5 MG tablet, Take 1 tablet (5 mg total) by mouth every 6 (six) hours as needed for refractory nausea / vomiting.   rOPINIRole (REQUIP) 2 MG tablet, Take  1 tablet  3 x /day  for Restless Legs   senna-docusate (SENOKOT-S) 8.6-50 MG tablet, Take 2 tablets by mouth 2 (two) times daily.   sucralfate (CARAFATE) 1 g tablet, TAKE 1 TABLET BY MOUTH 4 TIMES DAILY BEFORE MEAL(S) AND AT BEDTIME FOR INDIGESTION AND FOR HEARTBURN   trospium (SANCTURA) 20 MG tablet, Take 20 mg by mouth 2 (two) times daily.   Allergies:  Allergies  Allergen Reactions   Ace Inhibitors Cough    Per Dr Melvyn Novas pulmonology 2013   Aspartame Diarrhea and Nausea And Vomiting   Atorvastatin Other (See Comments)    Muscle aches   Biaxin [Clarithromycin] Other (See Comments)    Unknown reaction : PATIENT CAN TOLERATE Z-PAK.  Is written on patient's paper chart.   Daliresp [Roflumilast] Nausea And Vomiting   Erythromycin Other (See Comments)    Unknown reaction: PATIENT CAN TOLERATE Z-PAK.  It is written on patient's paper chart.   Levofloxacin Nausea And Vomiting    Problem list She has Essential hypertension; Mild intermittent asthma without complication; ESOPHAGEAL STRICTURE; Incarcerated recurrent paraesophageal hiatal hernia s/p lap redo repair [id number]; MGUS (monoclonal gammopathy of unknown significance); Vitamin D deficiency; Osteoporosis; Coronary artery disease involving native heart without angina  pectoris; Depression, major, in partial remission ([place]); Chronic pain syndrome; Hyperlipidemia, mixed; Gastroesophageal reflux disease; DDD (degenerative disc disease), lumbar; Spinal stenosis, lumbar region, with neurogenic claudication; Odynophagia; Atherosclerosis of aorta ([place]); Chronic bronchitis ([facility]); T12 compression fracture ([facility]); CKD (chronic kidney disease) stage 2, GFR 60-89 ml/min; Abnormal glucose (prediabetes); FHx: heart disease; Senile purpura ([place]); Epigastric pain; History of fundoplication; Atypical chest pain; History of adenomatous polyp of colon; and Iron deficiency on their problem list.   Social History:   reports that she has never smoked. She has never used smokeless tobacco. She reports that she does not drink alcohol and does not use drugs.  Observations/Objective:  General : Well sounding patient in no apparent distress HEENT: no hoarseness, no cough for duration of visit Lungs: speaks in complete sentences, no audible wheezing, no apparent distress Neurological: alert, oriented x 3 Psychiatric: pleasant, judgement appropriate   Assessment and Plan:  Covid 19 Diagnoses and all orders for this  visit:  COVID -     doxycycline (VIBRAMYCIN) 100 MG capsule; Take 1 capsule (100 mg total) by mouth 2 (two) times daily for 10 days. -     benzonatate (TESSALON PERLES) 100 MG capsule; Take 1 capsule (100 mg total) by mouth 3 (three) times daily as needed for cough (Max: '600mg'$  per day). -     promethazine-dextromethorphan (PROMETHAZINE-DM) 6.25-15 MG/5ML syrup; Take 5 mLs by mouth 4 (four) times daily as needed for cough. -     dexamethasone (DECADRON) 1 MG tablet; Take 3 tabs for 3 days, 2 tabs for 3 days 1 tab for 5 days. Take with food.    Covid 19 positive per rapid screening test at home Risk factors include: Advanced age, asthma Symptoms are: mild       Immue support with vitamin D 1000 mg daily, zinc 50 mg daily, vitamin D Reviewed doing regular breathing  exercises, proning Take tylenol PRN temp 101+ Push hydration Regular ambulation or calf exercises exercises for clot prevention and 81 mg ASA unless contraindicated Sx supportive therapy suggested Follow up via mychart or telephone if needed Advised patient obtain O2 monitor; present to ED if persistently <88% or with severe dyspnea, CP, fever uncontrolled by tylenol, confusion, sudden decline Should remain in isolation until at least 5 days from onset of sx, 24-48 hours fever free without tylenol, sx such as cough are improved.      Follow Up Instructions:  I discussed the assessment and treatment plan with the patient. The patient was provided an opportunity to ask questions and all were answered. The patient agreed with the plan and demonstrated an understanding of the instructions.   The patient was advised to call back or seek an in-person evaluation if the symptoms worsen or if the condition fails to improve as anticipated.  I provided 20 minutes of non-face-to-face time during this encounter.   Magda Bernheim, NP

## 2020-12-28 ENCOUNTER — Other Ambulatory Visit: Payer: Self-pay | Admitting: Nurse Practitioner

## 2020-12-28 DIAGNOSIS — U071 COVID-19: Secondary | ICD-10-CM

## 2021-01-04 ENCOUNTER — Other Ambulatory Visit: Payer: Self-pay

## 2021-01-04 ENCOUNTER — Encounter: Payer: Self-pay | Admitting: Gastroenterology

## 2021-01-04 ENCOUNTER — Other Ambulatory Visit (INDEPENDENT_AMBULATORY_CARE_PROVIDER_SITE_OTHER): Payer: PPO

## 2021-01-04 ENCOUNTER — Ambulatory Visit (INDEPENDENT_AMBULATORY_CARE_PROVIDER_SITE_OTHER): Payer: PPO | Admitting: Gastroenterology

## 2021-01-04 VITALS — BP 132/80 | HR 61 | Ht 62.5 in | Wt 149.0 lb

## 2021-01-04 DIAGNOSIS — E611 Iron deficiency: Secondary | ICD-10-CM

## 2021-01-04 DIAGNOSIS — Z79899 Other long term (current) drug therapy: Secondary | ICD-10-CM

## 2021-01-04 DIAGNOSIS — K219 Gastro-esophageal reflux disease without esophagitis: Secondary | ICD-10-CM | POA: Diagnosis not present

## 2021-01-04 DIAGNOSIS — Z86010 Personal history of colonic polyps: Secondary | ICD-10-CM

## 2021-01-04 LAB — CBC WITH DIFFERENTIAL/PLATELET
Basophils Absolute: 0 10*3/uL (ref 0.0–0.1)
Basophils Relative: 0.5 % (ref 0.0–3.0)
Eosinophils Absolute: 0.2 10*3/uL (ref 0.0–0.7)
Eosinophils Relative: 2.5 % (ref 0.0–5.0)
HCT: 41.3 % (ref 36.0–46.0)
Hemoglobin: 13.8 g/dL (ref 12.0–15.0)
Lymphocytes Relative: 23.3 % (ref 12.0–46.0)
Lymphs Abs: 2 10*3/uL (ref 0.7–4.0)
MCHC: 33.3 g/dL (ref 30.0–36.0)
MCV: 94.1 fl (ref 78.0–100.0)
Monocytes Absolute: 0.9 10*3/uL (ref 0.1–1.0)
Monocytes Relative: 10.6 % (ref 3.0–12.0)
Neutro Abs: 5.4 10*3/uL (ref 1.4–7.7)
Neutrophils Relative %: 63.1 % (ref 43.0–77.0)
Platelets: 338 10*3/uL (ref 150.0–400.0)
RBC: 4.39 Mil/uL (ref 3.87–5.11)
RDW: 13.4 % (ref 11.5–15.5)
WBC: 8.6 10*3/uL (ref 4.0–10.5)

## 2021-01-04 LAB — IBC + FERRITIN
Ferritin: 38.4 ng/mL (ref 10.0–291.0)
Iron: 106 ug/dL (ref 42–145)
Saturation Ratios: 28.7 % (ref 20.0–50.0)
TIBC: 369.6 ug/dL (ref 250.0–450.0)
Transferrin: 264 mg/dL (ref 212.0–360.0)

## 2021-01-04 NOTE — Progress Notes (Signed)
HPI :  85 year old female here for a follow-up visit.  She has a history of a remote TIA, history of paraesophageal hernias in the past repaired with fundoplication, history of chronic back pain.  The main reason she is here today is to discuss if she needs a colonoscopy or further endoscopic evaluation.  Her last colonoscopy was performed in January 2019.  Recall she had 6 small adenomas, diverticulosis, and internal hemorrhoids.  She denies any significant problems with her bowels at this present time.  She has occasional constipation.  Takes MiraLAX and prunes which worked well to keep her bowels moving.  She denies any blood in her stools.  She takes aspirin 81 mg a day and no NSAIDs.  Her father did have colon cancer at age 52.  She has had COVID in recent weeks but states it was a mild case.  Her quarantine ended on Wednesday last week.  Her last endoscopy was performed in March 2021.  She had evidence of esophageal candidiasis and otherwise history of Nissen fundoplication which appeared intact.  Biopsies negative for H. pylori.  She is taking omeprazole twice daily at this time which works pretty well to control her heartburn.  She is written for Carafate but does not really take it routinely.  She is on Prolia for what she states is a history of osteoporosis.  She denies history of bone fracture.  She has not tried lower dose of PPI in some time.  She has a history of coronary artery disease with a history of stent 95, history of TIA.   On review of labs from June she had a hemoglobin of 14.5 with an MCV of 94.  Her total iron level was normal at 112, iron sat of 30%.  Her ferritin was 15 (lower limit of normal at 16).  Again she denies any use of blood thinners or any blood noted per rectum.  She denies any NSAID use otherwise.   Most recent workup: MRCP 12/27/19 - IMPRESSION: 1. Mild intrahepatic and moderate extrahepatic biliary ductal dilation is unchanged from prior CT examination  possibly representing post cholecystectomy change. No intraluminal filling defect is identified. 2. No acute findings in the abdomen. 3. Moderate hiatal hernia. 4. Trace left pleural effusion.  EGD 07/11/19 - The Z-line was regular. - Patchy, white plaques were found in the middle third of the esophagus and in the lower third of the esophagus consistent with esophageal candidiasis. - The lower third of the esophagus was tortuous with an angulated turn into the Nissen. No obvious stenosis noted at the GEJ (previously had this in 2019 and dilated) - The exam of the esophagus was otherwise normal. - Evidence of a Nissen fundoplication was found in the cardia. The wrap appeared intact. A retained suture was present. I attempted to remove it with forceps but it was buried into the mucosa and not removed. - The exam of the stomach was otherwise normal. - Biopsies were taken with a cold forceps in the gastric body, at the incisura and in the gastric antrum for Helicobacter pylori testing. - The duodenal bulb and second portion of the duodenum were normal.  Surgical [P], gastric antrum and gastric body - REACTIVE GASTROPATHY - NO H. PYLORI OR INTESTINAL METAPLASIA IDENTIFIED - SEE COMMENT    CT scan 06/10/2019 - IMPRESSION: 1. No CT evidence for acute intra-abdominal or pelvic process. 2. Colonic diverticulosis without evidence for acute diverticulitis. 3. Status post cholecystectomy with chronic intra and extrahepatic biliary  dilatation, stable. 4. Chronic compression deformities with sequelae of prior vertebral augmentation throughout the visualized thoracolumbar spine. 5.  Aortic Atherosclerosis (ICD10-I70.0).   [place] 06/10/19 -  IMPRESSION: 1. Chronic post cholecystectomy intra and extrahepatic biliary Dilatation. CBD 11.59m 2. Otherwise, unremarkable abdominal ultrasound examination.   CT scan 05/12/18 -  IMPRESSION: 1. No acute findings.  No findings to account abdominal pain. 2.  Chronic findings include chronic intra and extrahepatic bile duct dilation, aortic atherosclerosis, and multiple vertebral fractures. CBD 162m  Colonoscopy 05/17/2017 - The perianal and digital rectal examinations were normal. - A 4 mm polyp was found in the cecum. The polyp was sessile. The polyp was removed with a cold snare. Resection and retrieval were complete. - Two sessile polyps were found in the ascending colon. The polyps were 4 to 5 mm in size. These polyps were removed with a cold snare. Resection and retrieval were complete. - Two sessile polyps were found in the transverse colon. The polyps were 4 to 5 mm in size. These polyps were removed with a cold snare. Resection and retrieval were complete. - A 6 mm polyp was found in the descending colon. The polyp was sessile. The polyp was removed with a cold snare. Resection and retrieval were complete. - Scattered medium-mouthed diverticula were found in the entire colon. - Internal hemorrhoids were found during retroflexion. - The colon was tortous. The exam was otherwise without abnormality.  6 adenomas   EGD 02/20/2018 - Esophagogastric landmarks were identified: the Z-line was found at 36 cm. - The lower esophagus was tortuous with an angulated turn into the fundoplication. No obvious stenosis was appreciated. - A TTS dilator was passed through the scope. Dilation with a 16-17-18 mm balloon dilator was performed to 16 mm, 17 mm and 18 mm at the gastroesophageal junction without any appreciable mucosal wrent. - Multiple diminutive white plaques were found in the lower third of the esophagus. Brushings for KOH prep were obtained. - The exam of the esophagus was otherwise normal. - Evidence of a Nissen fundoplication was found in the cardia, with a retained suture visible. The wrap was intact. - Patchy inflammation characterized by erythema was found in the gastric antrum. Biopsies were taken with a cold forceps for  histology. - The exam of the stomach was otherwise normal. - The duodenal bulb and second portion of the duodenum were normal.   EGD 01/24/2018 - Z-line, 35 cm from the incisors. - Suspect mild esophagitis dissecans - Tortous esophagus with angulated entrance to fundoplication versus mild stenosis - dilation not performed given volume of food in the stomach - A large amount of food (residue) in the stomach, the procedure was aborted.     EGD 12/25/2015 -  Normal exam, fundoplication noted - per Dr. DaLoletha Carrowsophageal manometry 08/15/2015 - intact peristalsis, normal LES relaxation, intermittent nonspecific spasm EGD 03/15/2013 - esophageal candidiasis, recurrent 4-5 cm hernia, history of Nissen with CaLysbeth Galasesion Colonoscopy 04/07/2011 - 2 small adenomatous polyps, internal hemorrhoids,    CT angio abdomen / pelvis - 04/05/2017 - normal CT abdomen / pelvis 03/10/2017 - mild stable CBD dilation 1037most cholecystectomy CT abdomen pelvis 03/27/2016 - normal abdomen,  Barium swallow 12/24/15 - stricture in distal 3rd of esophagus, nonspecific dysmotility Gastric emptying study 03/2013 - normal    Past Medical History:  Diagnosis Date   Anxiety    takes Xanax daily as needed   Asthma    Albuterol daily as needed   Blood transfusion without reported  diagnosis    CAD (coronary artery disease)    LHC 2003 with 40% pLAD, 30% mLAD, 100% D1 (moderate vessel), 100%  D2 (small vessel), 95% mid small OM2.  Pt had PTCA to D1;  D2 and OM2 small vessels and tx medically;  Last Myoview (2012): anterior infarct seen with scar, no ischemia. EF normal. Med Rx // Myoview (12/14):  Low risk; ant scar w/ peri-infarct ischemia; EF 55% with ant AK // Myoview 5/16: EF 55-65, ant, apical scar, no ischemia    Cataract    Chronic back pain    HNP, DDD, spinal stenosis, vertebral compression fractures.    Chronic nausea    takes Zofran daily as needed.  normal gastric emptying study 03/2013   CKD (chronic kidney  disease), stage III ([place]) 09/27/2017   Constipation    felt to be functional. takes Senokot and Miralax daily.  treated with Linzess in past.    DIVERTICULOSIS, COLON 08/28/2008   Qualifier: Diagnosis of  By: [name], [name], [name]     Elevated LFTs 2015   probably med induced DILI, from Augmentin vs Pravastatin   GERD (gastroesophageal reflux disease)    hx of esophageal stricture    Hiatal hernia    Paraesophageal hernias. s/p fundoplications in 0000000 and 04/2013   History of bronchitis several of yrs ago   History of colon polyps 03/2009, 03/2011   adenomatous colon polyps, no high grade dysplasia.    History of vertigo    no meds   Hyperlipidemia    takes Pravastatin daily   Hypertension    takes Amlodipine,Metoprolol,and Losartan daily   Multiple closed pelvic fractures without disruption of pelvic circle (HCC)    Nausea 03/2016   Osteoporosis    PONV (postoperative nausea and vomiting)    yrs ago   Pubic ramus fracture (HCC) 12/20/2019   Slow urinary stream    takes Rapaflo daily   TIA (transient ischemic attack) 1995   no residual effects noted   Vertebral compression fracture ([place]) 06/23/2012     Past Surgical History:  Procedure Laterality Date   APPENDECTOMY     patient unsure of date   [place]     Patient unsure of date.   CORONARY ANGIOPLASTY  11/16/2001   1 stent   ESOPHAGEAL MANOMETRY N/A 03/25/2013   Procedure: ESOPHAGEAL MANOMETRY (EM);  Surgeon: Inda Castle, MD;  Location: [facility];  Service: Endoscopy;  Laterality: N/A;   ESOPHAGOGASTRODUODENOSCOPY N/A 03/15/2013   Procedure: ESOPHAGOGASTRODUODENOSCOPY (EGD);  Surgeon: Jerene Bears, MD;  Location: [facility] END[facility];  Service: Endoscopy;  Laterality: N/A;   ESOPHAGOGASTRODUODENOSCOPY N/A 12/25/2015   Procedure: ESOPHAGOGASTRODUODENOSCOPY (EGD);  Surgeon: Doran Stabler, MD;  Location: [facility] ENDOSCOPY;  Service: Endoscopy;  Laterality: N/A;   EYE SURGERY  11/2000    bilateral cataracts with lens implant   FIXATION KYPHOPLASTY LUMBAR SPINE  X 2   "L3-4"   HEMORRHOID SURGERY     HIATAL HERNIA REPAIR  06/03/2011   Procedure: LAPAROSCOPIC REPAIR OF HIATAL HERNIA;  Surgeon: Adin Hector, MD;  Location: [facility];  Service: General;  Laterality: N/A;   HIATAL HERNIA REPAIR N/A 04/24/2013   Procedure: LAPAROSCOPIC  REPAIR RECURRENT PARASOPHAGEAL HIATAL HERNIA WITH FUNDOPLICATION;  Surgeon: Adin Hector, MD;  Location: [facility];  Service: General;  Laterality: N/A;   INSERTION OF MESH N/A 04/24/2013   Procedure: INSERTION OF MESH;  Surgeon: Adin Hector, MD;  Location: [facility];  Service: General;  Laterality: N/A;   IR KYPHO THORACIC WITH BONE BIOPSY  09/29/2017   IR KYPHO THORACIC WITH BONE BIOPSY  11/03/2017   IR KYPHO THORACIC WITH BONE BIOPSY  02/14/2019   IR KYPHO THORACIC WITH BONE BIOPSY  04/02/2019   LAPAROSCOPIC LYSIS OF ADHESIONS N/A 04/24/2013   Procedure: LAPAROSCOPIC LYSIS OF ADHESIONS;  Surgeon: Adin Hector, MD;  Location: [facility];  Service: General;  Laterality: N/A;   LAPAROSCOPIC NISSEN FUNDOPLICATION  [id number]   Procedure: LAPAROSCOPIC NISSEN FUNDOPLICATION;  Surgeon: Adin Hector, MD;  Location: [facility];  Service: General;  Laterality: N/A;   LUMBAR FUSION  12/07/2015   Right L2-3 facetectomy, posterolateral fusion L2-3 with pedicle screws, rods, local bone graft, Vivigen cancellous chips. Fusion extended to the L1 level with pedicle screws and rods   LUMBAR LAMINECTOMY/DECOMPRESSION MICRODISCECTOMY Right 06/26/2012   Procedure: LUMBAR LAMINECTOMY/DECOMPRESSION MICRODISCECTOMY;  Surgeon: Jessy Oto, MD;  Location: [place];  Service: Orthopedics;  Laterality: Right;  RIGHT L4-5 MICRODISCECTOMY   LUMBAR LAMINECTOMY/DECOMPRESSION MICRODISCECTOMY N/A 05/29/2015   Procedure: RIGHT L2-3 MICRODISCECTOMY;  Surgeon: Jessy Oto, MD;  Location: [place];  Service: Orthopedics;  Laterality: N/A;   TOTAL ABDOMINAL HYSTERECTOMY  1975   partial    UPPER GASTROINTESTINAL ENDOSCOPY     Family History  Problem Relation Age of Onset   Colon cancer Mother 8   Heart attack Father 25       heart attack   Brain cancer Brother        tumor    Heart disease Brother    Heart disease Brother    Kidney disease Daughter    Esophageal cancer Neg Hx    Stomach cancer Neg Hx    Social History   Tobacco Use   Smoking status: Never   Smokeless tobacco: Never  Vaping Use   Vaping Use: Never used  Substance Use Topics   Alcohol use: No   Drug use: Never   Current Outpatient Medications  Medication Sig Dispense Refill   amLODipine (NORVASC) 10 MG tablet TAKE 1 TABLET BY MOUTH ONCE DAILY FOR BLOOD PRESSURE AND FOR HEART 90 tablet 3   aspirin EC 81 MG tablet Take 81 mg by mouth at bedtime.      benzonatate (TESSALON PERLES) 100 MG capsule Take 1 capsule (100 mg total) by mouth 3 (three) times daily as needed for cough (Max: '600mg'$  per day). 30 capsule 0   budesonide-formoterol (SYMBICORT) 80-4.5 MCG/ACT inhaler Take 2 puffs first thing in am and then another 2 puffs about 12 hours later. 1 each 11   Cholecalciferol (VITAMIN D3) 5000 units CAPS Take 5,000 Units by mouth 2 (two) times daily.      cyclobenzaprine (FLEXERIL) 10 MG tablet Take  1 tablet  3 x /day  as needed for back pain /muscle spasm 90 tablet 0   denosumab (PROLIA) 60 MG/ML SOSY injection Inject 60 mg into the skin every 6 (six) months. 1 mL 0   dexamethasone (DECADRON) 1 MG tablet Take 3 tabs for 3 days, 2 tabs for 3 days 1 tab for 5 days. Take with food. 20 tablet 0   diclofenac sodium (VOLTAREN) 1 % GEL Apply 2 g topically 4 (four) times daily. (Patient taking differently: Apply 2 g topically 4 (four) times daily as needed (pain).) 1 Tube 1   gabapentin (NEURONTIN) 300 MG capsule Take 1 capsule (300 mg total) by mouth 4 (four) times daily. 20 capsule 0  ketoconazole (NIZORAL) 2 % cream Apply 1 application topically 2 x /day (Patient taking differently: Apply 1 application  topically 2 (two) times daily. Apply 1 application topically 2 x /day) 60 g 3   Magnesium 400 MG TABS Take 400 mg by mouth daily at 2 PM.     meclizine (ANTIVERT) 25 MG tablet Take 1/2 to 1 tablet 2 to 3 x /day ONLY if needed  For Dizziness / Vertigo (Patient taking differently: Take 12.5-25 mg by mouth 3 (three) times daily as needed for dizziness. Take 1/2 to 1 tablet 2 to 3 x /day ONLY if needed  For Dizziness / Vertigo) 270 tablet 1   metoprolol tartrate (LOPRESSOR) 50 MG tablet TAKE 1 TABLET BY MOUTH TWICE DAILY FOR BLOOD PRESSURE AND FOR HEART 180 tablet 1   Multiple Vitamins-Minerals (MULTIVITAMIN WITH MINERALS) tablet Take 1 tablet by mouth daily.     nitroGLYCERIN (NITROSTAT) 0.4 MG SL tablet Place 1 tablet (0.4 mg total) under the tongue every 5 (five) minutes as needed for chest pain. 90 tablet 3   omeprazole (PRILOSEC) 40 MG capsule Take 1 capsule  2 x /day  to Prevent Indigestion & Heartburn 180 capsule 3   promethazine (PHENERGAN) 25 MG tablet TAKE 1 TABLET BY MOUTH EVERY 4 HOURS AS NEEDED FOR NAUSEA AND VOMITING 50 tablet 0   promethazine-dextromethorphan (PROMETHAZINE-DM) 6.25-15 MG/5ML syrup Take 5 mLs by mouth 4 (four) times daily as needed for cough. 240 mL 1   rOPINIRole (REQUIP) 2 MG tablet Take  1 tablet  3 x /day  for Restless Legs 270 tablet 3   senna-docusate (SENOKOT-S) 8.6-50 MG tablet Take 2 tablets by mouth 2 (two) times daily. 60 tablet 1   sucralfate (CARAFATE) 1 g tablet TAKE 1 TABLET BY MOUTH 4 TIMES DAILY BEFORE MEAL(S) AND AT BEDTIME FOR INDIGESTION AND FOR HEARTBURN 360 tablet 3   trospium (SANCTURA) 20 MG tablet Take 20 mg by mouth 2 (two) times daily.     vitamin B-12 (CYANOCOBALAMIN) 1000 MCG tablet Take 1,000 mcg by mouth daily.     Current Facility-Administered Medications  Medication Dose Route Frequency Provider Last Rate Last Admin   calcitonin (salmon) (MIACALCIN/FORTICAL) nasal spray 1 spray  1 spray Alternating Nares Daily Jessy Oto, MD        Allergies  Allergen Reactions   Ace Inhibitors Cough    Per Dr Melvyn Novas pulmonology 2013   Aspartame Diarrhea and Nausea And Vomiting   Atorvastatin Other (See Comments)    Muscle aches   Biaxin [Clarithromycin] Other (See Comments)    Unknown reaction : PATIENT CAN TOLERATE Z-PAK.  Is written on patient's paper chart.   Daliresp [Roflumilast] Nausea And Vomiting   Erythromycin Other (See Comments)    Unknown reaction: PATIENT CAN TOLERATE Z-PAK.  It is written on patient's paper chart.   Levofloxacin Nausea And Vomiting     Review of Systems: All systems reviewed and negative except where noted in HPI.    Labs reviewed in epic  Physical Exam: BP 132/80   Pulse 61   Ht 5' 2.5" (1.588 m)   Wt 149 lb (67.6 kg)   SpO2 99%   BMI 26.82 kg/m  Constitutional: Pleasant,well-developed, female in no acute distress. Cardiovascular: Normal rate, regular rhythm.  Pulmonary/chest: Effort normal and breath sounds normal.  Abdominal: Soft, nondistended, nontender. There are no masses palpable.  Extremities: no edema Lymphadenopathy: No cervical adenopathy noted. Neurological: Alert and oriented to person place and time. Skin: Skin is  warm and dry. No rashes noted. Psychiatric: Normal mood and affect. Behavior is normal.   ASSESSMENT AND PLAN: 85 year old female here for reassessment of following:  History of colon polyps Iron deficiency GERD Long-term use of chronic PPI  We discussed today if the patient want to have another colonoscopy.  At her age and comorbidities we discussed risks probably outweigh benefits of routine screening.  I reviewed her labs from June, she has a normal hemoglobin and a normal MCV with normal iron level however her ferritin was one-point below normal.  She has not been taking any iron.  She has no symptoms.  If she had a true or severe iron deficiency would more strongly consider a colonoscopy but her ferritin is one-point below normal.  We will plan on  repeating her iron studies today and a CBC.  If she has progressive anemia or worsening iron deficiency she will reconsider colonoscopy, however if her labs are normal despite not being on any iron she would prefer to avoid colonoscopy and invasive testing at this time, which I think is reasonable.  We discussed her long-term reflux, she is on high-dose PPI for some time, she does have osteoporosis, at risk for bone fracture.  Long-term want to use the lowest dose of PPI needed to control symptoms.  She will try reducing her omeprazole to once daily dosing, she can increase to twice daily as needed if she does not tolerate it.  Otherwise we will await her labs with further recommendations  Plan: - TIBC / ferritin and CBC panel - if normal iron studies and CBC, patient's preference is to hold off on colonoscopy and endoscopic evaluation - reduce omeprazole to once daily if tolerateed, discussed long term risks / benefits, want to use lowest dose possible to control symptoms  Jolly Mango, MD [facility]

## 2021-01-04 NOTE — Progress Notes (Signed)
Per SA decrease to once daily dosing

## 2021-01-04 NOTE — Patient Instructions (Addendum)
If you are age 83 or older, your body mass index should be between 23-30. Your Body mass index is 26.82 kg/m. If this is out of the aforementioned range listed, please consider follow up with your Primary Care Provider.  If you are age 88 or younger, your body mass index should be between 19-25. Your Body mass index is 26.82 kg/m. If this is out of the aformentioned range listed, please consider follow up with your Primary Care Provider.   __________________________________________________________  The  GI providers would like to encourage you to use [facility] to communicate with providers for non-urgent requests or questions.  Due to long hold times on the telephone, sending your provider a message by [facility] may be a faster and more efficient way to get a response.  Please allow 48 business hours for a response.  Please remember that this is for non-urgent requests.   Please go to the lab in the basement of our building to have lab work done as you leave today. Hit "B" for basement when you get on the elevator.  When the doors open the lab is on your left.  We will call you with the results. Thank you.  Reduce your omeprazole to once daily.  Thank you for entrusting me with your care and for choosing [facility], Dr. Tenino Cellar

## 2021-01-14 ENCOUNTER — Ambulatory Visit: Payer: PPO | Admitting: Internal Medicine

## 2021-01-21 DIAGNOSIS — M961 Postlaminectomy syndrome, not elsewhere classified: Secondary | ICD-10-CM | POA: Diagnosis not present

## 2021-01-21 DIAGNOSIS — M150 Primary generalized (osteo)arthritis: Secondary | ICD-10-CM | POA: Diagnosis not present

## 2021-01-21 DIAGNOSIS — M4726 Other spondylosis with radiculopathy, lumbar region: Secondary | ICD-10-CM | POA: Diagnosis not present

## 2021-01-21 DIAGNOSIS — G894 Chronic pain syndrome: Secondary | ICD-10-CM | POA: Diagnosis not present

## 2021-01-22 ENCOUNTER — Ambulatory Visit (INDEPENDENT_AMBULATORY_CARE_PROVIDER_SITE_OTHER): Payer: PPO | Admitting: Specialist

## 2021-01-22 ENCOUNTER — Other Ambulatory Visit: Payer: Self-pay

## 2021-01-22 ENCOUNTER — Encounter: Payer: Self-pay | Admitting: Specialist

## 2021-01-22 VITALS — BP 138/76 | HR 73 | Ht 62.5 in | Wt 149.0 lb

## 2021-01-22 DIAGNOSIS — S22070G Wedge compression fracture of T9-T10 vertebra, subsequent encounter for fracture with delayed healing: Secondary | ICD-10-CM | POA: Diagnosis not present

## 2021-01-22 DIAGNOSIS — M4807 Spinal stenosis, lumbosacral region: Secondary | ICD-10-CM

## 2021-01-22 DIAGNOSIS — Z981 Arthrodesis status: Secondary | ICD-10-CM | POA: Diagnosis not present

## 2021-01-22 DIAGNOSIS — M546 Pain in thoracic spine: Secondary | ICD-10-CM

## 2021-01-22 DIAGNOSIS — M8000XA Age-related osteoporosis with current pathological fracture, unspecified site, initial encounter for fracture: Secondary | ICD-10-CM

## 2021-01-22 DIAGNOSIS — M4726 Other spondylosis with radiculopathy, lumbar region: Secondary | ICD-10-CM

## 2021-01-22 DIAGNOSIS — M4326 Fusion of spine, lumbar region: Secondary | ICD-10-CM

## 2021-01-22 DIAGNOSIS — M5441 Lumbago with sciatica, right side: Secondary | ICD-10-CM

## 2021-01-22 DIAGNOSIS — M5442 Lumbago with sciatica, left side: Secondary | ICD-10-CM

## 2021-01-22 DIAGNOSIS — G8929 Other chronic pain: Secondary | ICD-10-CM

## 2021-01-22 NOTE — Patient Instructions (Signed)
Plan: Avoid bending, stooping and avoid lifting weights greater than 10 lbs. Avoid prolong standing and walking. Avoid frequent bending and stooping  No lifting greater than 10 lbs. May use ice or moist heat for pain. Weight loss is of benefit. Handicap license is approved. Myelogram and post myelogram CT scan of the thoracic and lumbar spine assess previous L1 to L3 fusion, and right L1 and L2 neuroforamen for spondylosis and stenosis.

## 2021-01-22 NOTE — Progress Notes (Signed)
Office Visit Note   Patient: [name] [name]           Date of Birth: 09/17/1935           MRN: [id number] Visit Date: 01/22/2021              Requested by: [name], [place] [address] [place],  [place] [address] PCP: [name], MD   Assessment & Plan: Visit Diagnoses:  1. Spinal stenosis of lumbosacral region   2. Arthrodesis status   3. Fusion of lumbar spine   4. Chronic bilateral low back pain with bilateral sciatica   5. Other spondylosis with radiculopathy, lumbar region   6. Pain in thoracic spine   7. Compression fracture of T9 vertebra with delayed healing   8. Osteoporosis with current pathological fracture, unspecified osteoporosis type, initial encounter     Plan: Avoid bending, stooping and avoid lifting weights greater than 10 lbs. Avoid prolong standing and walking. Avoid frequent bending and stooping  No lifting greater than 10 lbs. May use ice or moist heat for pain. Weight loss is of benefit. Handicap license is approved. Myelogram and post myelogram CT scan of the thoracic and lumbar spine assess previous L1 to L3 fusion, and right L1 and L2 neuroforamen for spondylosis and stenosis.    Follow-Up Instructions: No follow-ups on file.   Orders:  No orders of the defined types were placed in this encounter.  No orders of the defined types were placed in this encounter.     Procedures: No procedures performed   Clinical Data: No additional findings.   Subjective: Chief Complaint  Patient presents with   Lower Back - Follow-up   Right Knee - Follow-up    HPI  Review of Systems   Objective: Vital Signs: BP 138/76 (BP Location: Left Arm, Patient Position: Sitting)   Pulse 73   Ht 5' 2.5" (1.588 m)   Wt 149 lb (67.6 kg)   BMI 26.82 kg/m   Physical Exam  Ortho Exam  Specialty Comments:  No specialty comments available.  Imaging: No results found.   PMFS History: Patient Active Problem List    Diagnosis Date Noted   Iron deficiency 10/16/2020   History of adenomatous polyp of colon 07/21/2020   Atypical chest pain 07/08/2020   History of fundoplication    Epigastric pain 07/09/2019   Senile purpura ([place]) 07/05/2019   Abnormal glucose (prediabetes) 03/01/2018   FHx: heart disease 03/01/2018   CKD (chronic kidney disease) stage 2, GFR 60-89 ml/min 09/27/2017   T12 compression fracture ([place]) 09/26/2017   Chronic bronchitis ([place]) 08/09/2017   Atherosclerosis of aorta ([place]) 04/05/2017   Odynophagia    Spinal stenosis, lumbar region, with neurogenic claudication 05/29/2015   DDD (degenerative disc disease), lumbar 04/21/2015   Hyperlipidemia, mixed 11/24/2014   Gastroesophageal reflux disease 11/24/2014   Depression, major, in partial remission ([place]) 09/26/2014   Chronic pain syndrome 09/26/2014   Coronary artery disease involving native heart without angina pectoris    Osteoporosis 02/10/2014   Vitamin D deficiency 08/14/2013   MGUS (monoclonal gammopathy of unknown significance) 03/05/2013   ESOPHAGEAL STRICTURE 08/28/2008   Mild intermittent asthma without complication [id number]   Incarcerated recurrent paraesophageal hiatal hernia s/p lap redo repair [id number] 01/31/2008   Essential hypertension 07/25/2007   Past Medical History:  Diagnosis Date   Anxiety    takes Xanax daily as needed   Asthma    Albuterol daily as needed   Blood transfusion  without reported diagnosis    CAD (coronary artery disease)    LHC 2003 with 40% pLAD, 30% mLAD, 100% D1 (moderate vessel), 100%  D2 (small vessel), 95% mid small OM2.  Pt had PTCA to D1;  D2 and OM2 small vessels and tx medically;  Last Myoview (2012): anterior infarct seen with scar, no ischemia. EF normal. Med Rx // Myoview (12/14):  Low risk; ant scar w/ peri-infarct ischemia; EF 55% with ant AK // Myoview 5/16: EF 55-65, ant, apical scar, no ischemia    Cataract    Chronic back pain    HNP, DDD, spinal stenosis, vertebral  compression fractures.    Chronic nausea    takes Zofran daily as needed.  normal gastric emptying study 03/2013   CKD (chronic kidney disease), stage III ([place]) 09/27/2017   Constipation    felt to be functional. takes Senokot and Miralax daily.  treated with Linzess in past.    DIVERTICULOSIS, COLON 08/28/2008   Qualifier: Diagnosis of  By: [name], [name], [name]     Elevated LFTs 2015   probably med induced DILI, from Augmentin vs Pravastatin   GERD (gastroesophageal reflux disease)    hx of esophageal stricture    Hiatal hernia    Paraesophageal hernias. s/p fundoplications in 0000000 and 04/2013   History of bronchitis several of yrs ago   History of colon polyps 03/2009, 03/2011   adenomatous colon polyps, no high grade dysplasia.    History of vertigo    no meds   Hyperlipidemia    takes Pravastatin daily   Hypertension    takes Amlodipine,Metoprolol,and Losartan daily   Multiple closed pelvic fractures without disruption of pelvic circle (HCC)    Nausea 03/2016   Osteoporosis    PONV (postoperative nausea and vomiting)    yrs ago   Pubic ramus fracture (HCC) 12/20/2019   Slow urinary stream    takes Rapaflo daily   TIA (transient ischemic attack) 1995   no residual effects noted   Vertebral compression fracture ([place]) 06/23/2012    Family History  Problem Relation Age of Onset   Colon cancer Mother 81   Heart attack Father 61       heart attack   Brain cancer Brother        tumor    Heart disease Brother    Heart disease Brother    Kidney disease Daughter    Esophageal cancer Neg Hx    Stomach cancer Neg Hx     Past Surgical History:  Procedure Laterality Date   APPENDECTOMY     patient unsure of date   BACK [place]     Patient unsure of date.   CORONARY ANGIOPLASTY  11/16/2001   1 stent   ESOPHAGEAL MANOMETRY N/A 03/25/2013   Procedure: ESOPHAGEAL MANOMETRY (EM);  Surgeon: Inda Castle, MD;  Location: [facility];   Service: Endoscopy;  Laterality: N/A;   ESOPHAGOGASTRODUODENOSCOPY N/A 03/15/2013   Procedure: ESOPHAGOGASTRODUODENOSCOPY (EGD);  Surgeon: Jerene Bears, MD;  Location[facility] ENDOSCOPY;  Service: Endoscopy;  Laterality: N/A;   ESOPHAGOGASTRODUODENOSCOPY N/A 12/25/2015   Procedure: ESOPHAGOGASTRODUODENOSCOPY (EGD);  Surgeon: Doran Stabler, MD;  Location: [facility] ENDOSCOPY;  Service: Endoscopy;  Laterality: N/A;   EYE SURGERY  11/2000   bilateral cataracts with lens implant   FIXATION KYPHOPLASTY LUMBAR SPINE  X 2   "L3-4"   [place] REPAIR  06/03/2011  Procedure: LAPAROSCOPIC REPAIR OF HIATAL HERNIA;  Surgeon: Adin Hector, MD;  Location: [facility] ORS;  Service: General;  Laterality: N/A;   HIATAL HERNIA REPAIR N/A 04/24/2013   Procedure: LAPAROSCOPIC  REPAIR RECURRENT PARASOPHAGEAL HIATAL HERNIA WITH FUNDOPLICATION;  Surgeon: Adin Hector, MD;  Location: [facility];  Service: General;  Laterality: N/A;   INSERTION OF MESH N/A 04/24/2013   Procedure: INSERTION OF MESH;  Surgeon: Adin Hector, MD;  Location: [facility];  Service: General;  Laterality: N/A;   IR KYPHO THORACIC WITH BONE BIOPSY  09/29/2017   IR KYPHO THORACIC WITH BONE BIOPSY  11/03/2017   IR KYPHO THORACIC WITH BONE BIOPSY  02/14/2019   IR KYPHO THORACIC WITH BONE BIOPSY  04/02/2019   LAPAROSCOPIC LYSIS OF ADHESIONS N/A 04/24/2013   Procedure: LAPAROSCOPIC LYSIS OF ADHESIONS;  Surgeon: Adin Hector, MD;  Location: [facility];  Service: General;  Laterality: N/A;   LAPAROSCOPIC NISSEN FUNDOPLICATION  [id number]   Procedure: LAPAROSCOPIC NISSEN FUNDOPLICATION;  Surgeon: Adin Hector, MD;  Location: [facility];  Service: General;  Laterality: N/A;   LUMBAR FUSION  12/07/2015   Right L2-3 facetectomy, posterolateral fusion L2-3 with pedicle screws, rods, local bone graft, Vivigen cancellous chips. Fusion extended to the L1 level with pedicle screws and rods   LUMBAR LAMINECTOMY/DECOMPRESSION MICRODISCECTOMY Right  06/26/2012   Procedure: LUMBAR LAMINECTOMY/DECOMPRESSION MICRODISCECTOMY;  Surgeon: Jessy Oto, MD;  Location: [facility];  Service: Orthopedics;  Laterality: Right;  RIGHT L4-5 MICRODISCECTOMY   LUMBAR LAMINECTOMY/DECOMPRESSION MICRODISCECTOMY N/A 05/29/2015   Procedure: RIGHT L2-3 MICRODISCECTOMY;  Surgeon: Jessy Oto, MD;  Location: [facility];  Service: Orthopedics;  Laterality: N/A;   TOTAL ABDOMINAL HYSTERECTOMY  1975   partial   UPPER GASTROINTESTINAL ENDOSCOPY     Social History   Occupational History   Occupation: Retired    Fish farm manager: RETIRED    Comment: worked at [employer] Use   Smoking status: Never   Smokeless tobacco: Never  Vaping Use   Vaping Use: Never used  Substance and Sexual Activity   Alcohol use: No   Drug use: Never   Sexual activity: Not Currently

## 2021-01-29 ENCOUNTER — Other Ambulatory Visit: Payer: Self-pay

## 2021-01-29 ENCOUNTER — Ambulatory Visit
Admission: RE | Admit: 2021-01-29 | Discharge: 2021-01-29 | Disposition: A | Discharge status: Home / Self Care | Payer: PPO | Source: Ambulatory Visit | Attending: Specialist | Admitting: Specialist

## 2021-01-29 DIAGNOSIS — Z981 Arthrodesis status: Secondary | ICD-10-CM

## 2021-01-29 DIAGNOSIS — M4856XA Collapsed vertebra, not elsewhere classified, lumbar region, initial encounter for fracture: Secondary | ICD-10-CM | POA: Diagnosis not present

## 2021-01-29 DIAGNOSIS — M4854XA Collapsed vertebra, not elsewhere classified, thoracic region, initial encounter for fracture: Secondary | ICD-10-CM | POA: Diagnosis not present

## 2021-01-29 DIAGNOSIS — M5124 Other intervertebral disc displacement, thoracic region: Secondary | ICD-10-CM | POA: Diagnosis not present

## 2021-01-29 DIAGNOSIS — M4726 Other spondylosis with radiculopathy, lumbar region: Secondary | ICD-10-CM

## 2021-01-29 DIAGNOSIS — M5442 Lumbago with sciatica, left side: Secondary | ICD-10-CM

## 2021-01-29 DIAGNOSIS — M4326 Fusion of spine, lumbar region: Secondary | ICD-10-CM | POA: Diagnosis not present

## 2021-01-29 DIAGNOSIS — S22070G Wedge compression fracture of T9-T10 vertebra, subsequent encounter for fracture with delayed healing: Secondary | ICD-10-CM

## 2021-01-29 DIAGNOSIS — M4694 Unspecified inflammatory spondylopathy, thoracic region: Secondary | ICD-10-CM | POA: Diagnosis not present

## 2021-01-29 DIAGNOSIS — M5126 Other intervertebral disc displacement, lumbar region: Secondary | ICD-10-CM | POA: Diagnosis not present

## 2021-01-29 DIAGNOSIS — M4696 Unspecified inflammatory spondylopathy, lumbar region: Secondary | ICD-10-CM | POA: Diagnosis not present

## 2021-01-29 DIAGNOSIS — M546 Pain in thoracic spine: Secondary | ICD-10-CM

## 2021-01-29 DIAGNOSIS — M4807 Spinal stenosis, lumbosacral region: Secondary | ICD-10-CM

## 2021-01-29 DIAGNOSIS — G8929 Other chronic pain: Secondary | ICD-10-CM

## 2021-01-29 MED ORDER — DIAZEPAM 5 MG PO TABS
5.0000 mg | ORAL_TABLET | Freq: Once | ORAL | Status: AC
  Administered 2021-01-29: 5 mg via ORAL

## 2021-01-29 MED ORDER — ONDANSETRON HCL 4 MG/2ML IJ SOLN: 4.0000 mg | Freq: Once | INTRAMUSCULAR | Status: DC | PRN

## 2021-01-29 MED ORDER — MEPERIDINE HCL 50 MG/ML IJ SOLN
50.0000 mg | Freq: Once | INTRAMUSCULAR | Status: AC | PRN
  Administered 2021-01-29: 50 mg via INTRAMUSCULAR

## 2021-01-29 MED ORDER — IOPAMIDOL (ISOVUE-M 300) INJECTION 61%
10.0000 mL | Freq: Once | INTRAMUSCULAR | Status: AC
  Administered 2021-01-29: 10 mL via INTRATHECAL

## 2021-01-29 NOTE — Discharge Instr - Other Orders (Addendum)
1100: pt reported pain 8/10 during myelogram procedure. See mar. \ 1129: pt reported relief in her pain.

## 2021-01-29 NOTE — Discharge Instructions (Signed)
Myelogram Discharge Instructions  Go home and rest quietly as needed. You may resume normal activities; however, do not exert yourself strongly or do any heavy lifting today and tomorrow.   DO NOT drive today.    You may resume your normal diet and medications unless otherwise indicated. Drink lots of extra fluids today and tomorrow.   The incidence of headache, nausea, or vomiting is about 5% (one in 20 patients).  If you develop a headache, lie flat for 24 hours and drink plenty of fluids until the headache goes away.  Caffeinated beverages may be helpful. If when you get up you still have a headache when standing, go back to bed and force fluids for another 24 hours.   If you develop severe nausea and vomiting or a headache that does not go away with the flat bedrest after 48 hours, please call [phone].   Call your physician for a follow-up appointment.  The results of your myelogram will be sent directly to your physician by the following day.  If you have any questions or if complications develop after you arrive home, please call [phone].  Discharge instructions have been explained to the patient.  The patient, or the person responsible for the patient, fully understands these instructions.   Thank you for visiting our office today.   

## 2021-02-10 ENCOUNTER — Other Ambulatory Visit: Payer: Self-pay | Admitting: Internal Medicine

## 2021-02-10 DIAGNOSIS — R112 Nausea with vomiting, unspecified: Secondary | ICD-10-CM

## 2021-02-14 ENCOUNTER — Other Ambulatory Visit: Payer: Self-pay | Admitting: Adult Health

## 2021-02-17 NOTE — Patient Instructions (Signed)
Due to recent changes in healthcare laws, you may see the results of your imaging and laboratory studies on MyChart before your provider has had a chance to review them.  We understand that in some cases there may be results that are confusing or concerning to you. Not all laboratory results come back in the same time frame and the provider may be waiting for multiple results in order to interpret others.  Please give us 48 hours in order for your provider to thoroughly review all the results before contacting the office for clarification of your results.  °++++++++++++++++++++++++++++++++++ ° Vit D  & °Vit C 1,000 mg   °are recommended to help protect  °against the Covid-19 and other Corona viruses.  ° ° Also it's recommended  °to take  °Zinc 50 mg  °to help  °protect against the Covid-19   °and best place to get ° is also on [employer].com  °and don't pay more than 6-8 cents /pill !  ° °===================================== °Coronavirus (COVID-19) Are you at risk? ° °Are you at risk for the Coronavirus (COVID-19)? ° °To be considered HIGH RISK for Coronavirus (COVID-19), you have to meet the following criteria: ° °Traveled to [place], [place], [place], [place] or [place]; or in the [place] to [place], [place], [place]  °or [place]; and have fever, cough, and shortness of breath within the last 2 weeks of travel OR °Been in close contact with a person diagnosed with COVID-19 within the last 2 weeks and have  °fever, cough,and shortness of breath ° °IF YOU DO NOT MEET THESE CRITERIA, YOU ARE CONSIDERED LOW RISK FOR COVID-19. ° °What to do if you are HIGH RISK for COVID-19? ° °If you are having a medical emergency, call 911. °Seek medical care right away. Before you go to a doctor’s office, urgent care or emergency department, ° call ahead and tell them about your recent travel, contact with someone diagnosed with COVID-19  ° and your symptoms.  °You should receive instructions from your physician’s office  regarding next steps of care.  °When you arrive at healthcare provider, tell the healthcare staff immediately you have returned from  °visiting [place], [place], [place], [place] or [place]; or traveled in the [place] to [place], [place],  °[place] or [place] in the last two weeks or you have been in close contact with a person diagnosed with  °COVID-19 in the last 2 weeks.   °Tell the health care staff about your symptoms: fever, cough and shortness of breath. °After you have been seen by a medical provider, you will be either: °Tested for (COVID-19) and discharged home on quarantine except to seek medical care if  °symptoms worsen, and asked to  °Stay home and avoid contact with others until you get your results (4-5 days)  °Avoid travel on public transportation if possible (such as bus, train, or airplane) or °Sent to the Emergency Department by EMS for evaluation, COVID-19 testing  and  °possible admission depending on your condition and test results. ° °What to do if you are LOW RISK for COVID-19? ° °Reduce your risk of any infection by using the same precautions used for avoiding the common cold or flu:  °Wash your hands often with soap and warm water for at least 20 seconds.  If soap and water are not readily available,  °use an alcohol-based hand sanitizer with at least 60% alcohol.  °If coughing or sneezing, cover your mouth and nose by coughing   or sneezing into the elbow areas of your shirt or coat, ° into a tissue or into your sleeve (not your hands). °Avoid shaking hands with others and consider head nods or verbal greetings only. °Avoid touching your eyes, nose, or mouth with unwashed hands.  °Avoid close contact with people who are sick. °Avoid places or events with large numbers of people in one location, like concerts or sporting events. °Carefully consider travel plans you have or are making. °If you are planning any travel outside or inside the [place], visit the CDC’s Travelers’ Health  webpage for the latest health notices. °If you have some symptoms but not all symptoms, continue to monitor at home and seek medical attention  °if your symptoms worsen. °If you are having a medical emergency, call 911. ° ° °++++++++++++++++++++++++++++++++ °Recommend Adult Low Dose Aspirin or  °coated  Aspirin 81 mg daily  °To reduce risk of Colon Cancer 40 %, ° Skin Cancer 26 % ,  °Melanoma 46% ° and  °Pancreatic cancer 60% °++++++++++++++++++++++++++++++++ °Vitamin D goal ° is between 70-100.  °Please make sure that you are taking your Vitamin D as directed.  °It is very important as a natural anti-inflammatory  °helping hair, skin, and nails, as well as reducing stroke and heart attack risk.  °It helps your bones and helps with mood. °It also decreases numerous cancer risks so please take it as directed.  °Low Vit D is associated with a 200-300% higher risk for CANCER  °and 200-300% higher risk for HEART   ATTACK  &  STROKE.   °...................................... °It is also associated with higher death rate at younger ages,  °autoimmune diseases like Rheumatoid arthritis, Lupus, Multiple Sclerosis.    °Also many other serious conditions, like depression, Alzheimer's °Dementia, infertility, muscle aches, fatigue, fibromyalgia - just to name a few. °++++++++++++++++++++ °Recommend the book "The END of DIETING" by Dr Joel Fuhrman  °& the book "The END of DIABETES " by Dr Joel Fuhrman °At [employer].com - get book & Audio CD's  °  Being diabetic has a  300% increased risk for heart attack, stroke, cancer, and alzheimer- type vascular dementia. It is very important that you work harder with diet by avoiding all foods that are white. Avoid white rice (brown & wild rice is OK), white potatoes (sweetpotatoes in moderation is OK), White bread or wheat bread or anything made out of white flour like bagels, donuts, rolls, buns, biscuits, cakes, pastries, cookies, pizza crust, and pasta (made from white flour & egg whites)  - vegetarian pasta or spinach or wheat pasta is OK. Multigrain breads like Arnold's or Pepperidge Farm, or multigrain sandwich thins or flatbreads.  Diet, exercise and weight loss can reverse and cure diabetes in the early stages.  Diet, exercise and weight loss is very important in the control and prevention of complications of diabetes which affects every system in your body, ie. Brain - dementia/stroke, eyes - glaucoma/blindness, heart - heart attack/heart failure, kidneys - dialysis, stomach - gastric paralysis, intestines - malabsorption, nerves - severe painful neuritis, circulation - gangrene & loss of a leg(s), and finally cancer and Alzheimers. ° °  I recommend avoid fried & greasy foods,  sweets/candy, white rice (brown or wild rice or Quinoa is OK), white potatoes (sweet potatoes are OK) - anything made from white flour - bagels, doughnuts, rolls, buns, biscuits,white and wheat breads, pizza crust and traditional pasta made of white flour & egg white(vegetarian pasta or spinach or wheat pasta is OK).    Multi-grain bread is OK - like multi-grain flat bread or sandwich thins. Avoid alcohol in excess. Exercise is also important. ° °  Eat all the vegetables you want - avoid meat, especially red meat and dairy - especially cheese.  Cheese is the most concentrated form of trans-fats which is the worst thing to clog up our arteries. Veggie cheese is OK which can be found in the fresh produce section at [employer] or [employer] or [employer] ° °+++++++++++++++++++++ °DASH Eating Plan ° °DASH stands for "Dietary Approaches to Stop Hypertension."  ° °The DASH eating plan is a healthy eating plan that has been shown to reduce high blood pressure (hypertension). Additional health benefits may include reducing the risk of type 2 diabetes mellitus, heart disease, and stroke. The DASH eating plan may also help with weight loss. °WHAT DO I NEED TO KNOW ABOUT THE DASH EATING PLAN? °For the DASH eating plan, you will  follow these general guidelines: °Choose foods with a percent daily value for sodium of less than 5% (as listed on the food label). °Use salt-free seasonings or herbs instead of table salt or sea salt. °Check with your health care provider or pharmacist before using salt substitutes. °Eat lower-sodium products, often labeled as "lower sodium" or "no salt added." °Eat fresh foods. °Eat more vegetables, fruits, and low-fat dairy products. °Choose whole grains. Look for the word "whole" as the first word in the ingredient list. °Choose fish  °Limit sweets, desserts, sugars, and sugary drinks. °Choose heart-healthy fats. °Eat veggie cheese  °Eat more home-cooked food and less restaurant, buffet, and fast food. °Limit fried foods. °Cook foods using methods other than frying. °Limit canned vegetables. If you do use them, rinse them well to decrease the sodium. °When eating at a restaurant, ask that your food be prepared with less salt, or no salt if possible. °                  °   WHAT FOODS CAN I EAT? °Read Dr Joel Fuhrman's books on The End of Dieting & The End of Diabetes ° °Grains °Whole grain or whole wheat bread. Brown rice. Whole grain or whole wheat pasta. Quinoa, bulgur, and whole grain cereals. Low-sodium cereals. Corn or whole wheat flour tortillas. Whole grain cornbread. Whole grain crackers. Low-sodium crackers. ° °Vegetables °Fresh or frozen vegetables (raw, steamed, roasted, or grilled). Low-sodium or reduced-sodium tomato and vegetable juices. Low-sodium or reduced-sodium tomato sauce and paste. Low-sodium or reduced-sodium canned vegetables.  ° °Fruits °All fresh, canned (in natural juice), or frozen fruits. ° °Protein Products ° All fish and seafood.  Dried beans, peas, or lentils. Unsalted nuts and seeds. Unsalted canned beans. ° °Dairy °Low-fat dairy products, such as skim or 1% milk, 2% or reduced-fat cheeses, low-fat ricotta or cottage cheese, or plain low-fat yogurt. Low-sodium or reduced-sodium  cheeses. ° °Fats and Oils °Tub margarines without trans fats. Light or reduced-fat mayonnaise and salad dressings (reduced sodium). Avocado. Safflower, olive, or canola oils. Natural peanut or almond butter. ° °Other °Unsalted popcorn and pretzels. °The items listed above may not be a complete list of recommended foods or beverages. Contact your dietitian for more options. ° °+++++++++++++++ ° °WHAT FOODS ARE NOT RECOMMENDED? °Grains/ White flour or wheat flour °White bread. White pasta. White rice. Refined cornbread. Bagels and croissants. Crackers that contain trans fat. ° °Vegetables ° °Creamed or fried vegetables. Vegetables in a . Regular canned vegetables. Regular canned tomato sauce and paste. Regular tomato and vegetable juices. ° °  Fruits °Dried fruits. Canned fruit in light or heavy syrup. Fruit juice. ° °Meat and Other Protein Products °Meat in general - RED meat & White meat.  Fatty cuts of meat. Ribs, chicken wings, all processed meats as bacon, sausage, bologna, salami, fatback, hot dogs, bratwurst and packaged luncheon meats. ° °Dairy °Whole or 2% milk, cream, half-and-half, and cream cheese. Whole-fat or sweetened yogurt. Full-fat cheeses or blue cheese. Non-dairy creamers and whipped toppings. Processed cheese, cheese spreads, or cheese curds. ° °Condiments °Onion and garlic salt, seasoned salt, table salt, and sea salt. Canned and packaged gravies. Worcestershire sauce. Tartar sauce. Barbecue sauce. Teriyaki sauce. Soy sauce, including reduced sodium. Steak sauce. Fish sauce. Oyster sauce. Cocktail sauce. Horseradish. Ketchup and mustard. Meat flavorings and tenderizers. Bouillon cubes. Hot sauce. Tabasco sauce. Marinades. Taco seasonings. Relishes. ° °Fats and Oils °Butter, stick margarine, lard, shortening and bacon fat. Coconut, palm kernel, or palm oils. Regular salad dressings. ° °Pickles and olives. Salted popcorn and pretzels. ° °The items listed above may not be a complete list of foods and  beverages to avoid. ° °

## 2021-02-17 NOTE — Progress Notes (Signed)
Future Appointments  Date Time Provider [place]  02/18/2021 10:30 AM [name], MD [facility] None  02/22/2021 10:45 AM [name], MD [facility] None  03/02/2021 10:00 AM Rush Landmark, RPH [facility] None  03/03/2021 10:00 AM Jessy Oto, MD [facility] None  03/18/2021 10:15 AM [name], MD [facility] None  07/22/2021         - Wellness  10:30 AM [name], NP [facility] None  10/18/2021         -    CPE  9:00 AM [name], NP [facility] None    History of Present Illness:       This very nice  MWF  presents for 6  month follow up with HTN, HLD, Pre-Diabetes and Vitamin D Deficiency.  Lumbar CT scan on 01/29/2021 showed Aortic & Coronary Art Atherosclerosis.        Patient is treated for HTN & BP has been controlled at home. Today's BP is at goal - 114/70. Patient has had no complaints of any cardiac type chest pain, palpitations, dyspnea / orthopnea / PND, dizziness, claudication, or dependent edema.       Hyperlipidemia is controlled with diet & meds. Patient denies myalgias or other med SE's. Last Lipids were at goal:                                      Lab Results  Component Value Date   CHOL 148 10/15/2020   HDL 57 10/15/2020   LDLCALC 75 10/15/2020   TRIG 75 10/15/2020   CHOLHDL 2.6 10/15/2020     Also, the patient has history of PreDiabetes and has had no symptoms of reactive hypoglycemia, diabetic polys, paresthesias or visual blurring.  Last A1c was not at goal:  Lab Results  Component Value Date   HGBA1C 5.9 (H) 10/15/2020        Further, the patient also has history of Vitamin D Deficiency and supplements vitamin D without any suspected side-effects. Last vitamin D was at goal:   Lab Results  Component Value Date   VD25OH 97 10/15/2020     Current Outpatient Medications on File Prior to Visit  Medication Sig   amLODipine (NORVASC) 10 MG tablet TAKE 1 TABLET BY MOUTH ONCE DAILY FOR BLOOD PRESSURE AND FOR HEART    aspirin EC 81 MG tablet Take 81 mg by mouth at bedtime.    budesonide-formoterol (SYMBICORT) 80-4.5 MCG/ACT inhaler Take 2 puffs first thing in am and then another 2 puffs about 12 hours later.   Cholecalciferol (VITAMIN D3) 5000 units CAPS Take 5,000 Units by mouth 2 (two) times daily.    cyclobenzaprine (FLEXERIL) 10 MG tablet Take  1 tablet  3 x /day  as needed for back pain /muscle spasm   PROLIA 60 MG/ injection Inject 60 mg into the skin every 6 (six) months.   diclofenac 1 % GEL Apply 2 g 4 times daily. (Patient taking differently: Apply 2 g topically 4 (four) times daily as needed (pain).)   gabapentin 300 MG capsule Take 1 capsule (300 mg total) by mouth 4 (four) times daily.   ketoconazole (NIZORAL) 2 % cream Apply 1 application topically 2 x /day (Patient taking differently: Apply 1 application topically 2 (two) times daily. Apply 1 application topically 2 x /day)   Magnesium 400 MG TABS Take 400 mg by mouth daily at 2 PM.   meclizine (  ANTIVERT) 25 MG tablet Take 1/2 to 1 tablet 2 to 3 x /day ONLY if needed  For Dizziness / Vertigo (Patient taking differently: Take 12.5-25 mg by mouth 3 (three) times daily as needed for dizziness. Take 1/2 to 1 tablet 2 to 3 x /day ONLY if needed  For Dizziness / Vertigo)   metoprolol tartrate (LOPRESSOR) 50 MG tablet TAKE 1 TABLET BY MOUTH TWICE DAILY FOR BLOOD PRESSURE AND FOR HEART   Multiple Vitamins-Minerals  Take 1 tablet by mouth daily.   NITROSTAT 0.4 MG SL tablet Place 1 tablet (0.4 mg total) under the tongue every 5 (five) minutes as needed for chest pain.   omeprazole (PRILOSEC) 40 MG capsule Take 1 capsule (40 mg total) by mouth daily.   promethazine (PHENERGAN) 25 MG tablet Take  1 tablet  Every 4 hours ONLY if needed for Nausea & Vomiting                  /TAKE 1 TABLET BY MOUTH EVERY 4 HOURS AS NEEDED FOR NAUSEA AND VOMITING   rOPINIRole (REQUIP) 2 MG tablet Take  1 tablet  3 x /day  for Restless Legs   senna-docusate (SENOKOT-S) 8.6-50 MG  tablet Take 2 tablets by mouth 2 (two) times daily.   sucralfate (CARAFATE) 1 g tablet TAKE 1 TABLET BY MOUTH 4 TIMES DAILY BEFORE MEAL(S) AND AT BEDTIME FOR INDIGESTION AND FOR HEARTBURN   trospium (SANCTURA) 20 MG tablet Take 20 mg  2 (two) times daily.   vitamin B-12 1000 MCG tablet Take daily.   Current Facility-Administered Medications on File Prior to Visit  Medication   calcitonin (salmon) (MIACALCIN/FORTICAL) nasal spray 1 spray     Allergies  Allergen Reactions   Ace Inhibitors Cough    Per Dr Melvyn Novas pulmonology 2013   Aspartame Diarrhea and Nausea And Vomiting   Atorvastatin Other (See Comments)    Muscle aches   Biaxin [Clarithromycin] Other (See Comments)    Unknown reaction : PATIENT CAN TOLERATE Z-PAK.  Is written on patient's paper chart.   Daliresp [Roflumilast] Nausea And Vomiting   Erythromycin Other (See Comments)    Unknown reaction: PATIENT CAN TOLERATE Z-PAK.  It is written on patient's paper chart.   Levofloxacin Nausea And Vomiting     PMHx:   Past Medical History:  Diagnosis Date   Anxiety    takes Xanax daily as needed   Asthma    Albuterol daily as needed   Blood transfusion without reported diagnosis    CAD (coronary artery disease)    LHC 2003 with 40% pLAD, 30% mLAD, 100% D1 (moderate vessel), 100%  D2 (small vessel), 95% mid small OM2.  Pt had PTCA to D1;  D2 and OM2 small vessels and tx medically;  Last Myoview (2012): anterior infarct seen with scar, no ischemia. EF normal. Med Rx // Myoview (12/14):  Low risk; ant scar w/ peri-infarct ischemia; EF 55% with ant AK // Myoview 5/16: EF 55-65, ant, apical scar, no ischemia    Cataract    Chronic back pain    HNP, DDD, spinal stenosis, vertebral compression fractures.    Chronic nausea    takes Zofran daily as needed.  normal gastric emptying study 03/2013   CKD (chronic kidney disease), stage III ([place]) 09/27/2017   Constipation    felt to be functional. takes Senokot and Miralax daily.  treated  with Linzess in past.    DIVERTICULOSIS, COLON 08/28/2008   Qualifier: Diagnosis of  By: [name],  [name], [name]     Elevated LFTs 2015   probably med induced DILI, from Augmentin vs Pravastatin   GERD (gastroesophageal reflux disease)    hx of esophageal stricture    Hiatal hernia    Paraesophageal hernias. s/p fundoplications in 2725 and 04/2013   History of bronchitis several of yrs ago   History of colon polyps 03/2009, 03/2011   adenomatous colon polyps, no high grade dysplasia.    History of vertigo    no meds   Hyperlipidemia    takes Pravastatin daily   Hypertension    takes Amlodipine,Metoprolol,and Losartan daily   Multiple closed pelvic fractures without disruption of pelvic circle (HCC)    Nausea 03/2016   Osteoporosis    PONV (postoperative nausea and vomiting)    yrs ago   Pubic ramus fracture ([place]) 12/20/2019   Slow urinary stream    takes Rapaflo daily   TIA (transient ischemic attack) 1995   no residual effects noted   Vertebral compression fracture ([place]) 06/23/2012     Immunization History  Administered Date(s) Administered   DT  05/06/2014   Influenza Split 02/07/2011   Influenza Whole 02/09/2009, 01/19/2010, 02/13/2012   Influenza, High Dose  02/14/2018, 02/14/2019, 03/16/2020   PFIZER  SARS-COV-2 Vacc 05/25/2019, 06/15/2019, 03/24/2020   Pneumococcal -13 05/06/2014   Pneumococcal -23 02/09/2011   Pneumococcal -23 02/06/2010, 02/09/2012   Td 05/10/2003     Past Surgical History:  Procedure Laterality Date   APPENDECTOMY     patient unsure of date   [place]     Patient unsure of date.   CORONARY ANGIOPLASTY  11/16/2001   1 stent   ESOPHAGEAL MANOMETRY N/A 03/25/2013   Procedure: ESOPHAGEAL MANOMETRY (EM);  Surgeon: Inda Castle, MD;  Location: [facility];  Service: Endoscopy;  Laterality: N/A;   ESOPHAGOGASTRODUODENOSCOPY N/A 03/15/2013   Procedure: ESOPHAGOGASTRODUODENOSCOPY (EGD);  Surgeon: Jerene Bears, MD;  Location: [facility];  Service: Endoscopy;  Laterality: N/A;   ESOPHAGOGASTRODUODENOSCOPY N/A 12/25/2015   Procedure: ESOPHAGOGASTRODUODENOSCOPY (EGD);  Surgeon: Doran Stabler, MD;  Location: [facility]COPY;  Service: Endoscopy;  Laterality: N/A;   EYE SURGERY  11/2000   bilateral cataracts with lens implant   FIXATION KYPHOPLASTY LUMBAR SPINE  X 2   "L3-4"   HEMORRHOID SURGERY     HIATAL HERNIA REPAIR  06/03/2011   Procedure: LAPAROSCOPIC REPAIR OF HIATAL HERNIA;  Surgeon: Adin Hector, MD;  Location: [facility];  Service: General;  Laterality: N/A;   HIATAL HERNIA REPAIR N/A 04/24/2013   Procedure: LAPAROSCOPIC  REPAIR RECURRENT PARASOPHAGEAL HIATAL HERNIA WITH FUNDOPLICATION;  Surgeon: Adin Hector, MD;  Location: [facility];  Service: General;  Laterality: N/A;   INSERTION OF MESH N/A 04/24/2013   Procedure: INSERTION OF MESH;  Surgeon: Adin Hector, MD;  Location: [facility];  Service: General;  Laterality: N/A;   IR KYPHO THORACIC WITH BONE BIOPSY  09/29/2017   IR KYPHO THORACIC WITH BONE BIOPSY  11/03/2017   IR KYPHO THORACIC WITH BONE BIOPSY  02/14/2019   IR KYPHO THORACIC WITH BONE BIOPSY  04/02/2019   LAPAROSCOPIC LYSIS OF ADHESIONS N/A 04/24/2013   Procedure: LAPAROSCOPIC LYSIS OF ADHESIONS;  Surgeon: Adin Hector, MD;  Location: [facility];  Service: General;  Laterality: N/A;   LAPAROSCOPIC NISSEN FUNDOPLICATION  3/66/4403   Procedure: LAPAROSCOPIC NISSEN FUNDOPLICATION;  Surgeon: Adin Hector, MD;  Location: [facility];  Service: General;  Laterality: N/A;   LUMBAR FUSION  12/07/2015   Right L2-3 facetectomy, posterolateral fusion L2-3 with pedicle screws, rods, local bone graft, Vivigen cancellous chips. Fusion extended to the L1 level with pedicle screws and rods   LUMBAR LAMINECTOMY/DECOMPRESSION MICRODISCECTOMY Right 06/26/2012   Procedure: LUMBAR LAMINECTOMY/DECOMPRESSION MICRODISCECTOMY;  Surgeon: Jessy Oto, MD;  Location: [facility];  Service: Orthopedics;   Laterality: Right;  RIGHT L4-5 MICRODISCECTOMY   LUMBAR LAMINECTOMY/DECOMPRESSION MICRODISCECTOMY N/A 05/29/2015   Procedure: RIGHT L2-3 MICRODISCECTOMY;  Surgeon: Jessy Oto, MD;  Location: [facility];  Service: Orthopedics;  Laterality: N/A;   TOTAL ABDOMINAL HYSTERECTOMY  1975   partial   UPPER GASTROINTESTINAL ENDOSCOPY      FHx:    Reviewed / unchanged  SHx:    Reviewed / unchanged   Systems Review:  Constitutional: Denies fever, chills, wt changes, headaches, insomnia, fatigue, night sweats, change in appetite. Eyes: Denies redness, blurred vision, diplopia, discharge, itchy, watery eyes.  ENT: Denies discharge, congestion, post nasal drip, epistaxis, sore throat, earache, hearing loss, dental pain, tinnitus, vertigo, sinus pain, snoring.  CV: Denies chest pain, palpitations, irregular heartbeat, syncope, dyspnea, diaphoresis, orthopnea, PND, claudication or edema. Respiratory: denies cough, dyspnea, DOE, pleurisy, hoarseness, laryngitis, wheezing.  Gastrointestinal: Denies dysphagia, odynophagia, heartburn, reflux, water brash, abdominal pain or cramps, nausea, vomiting, bloating, diarrhea, constipation, hematemesis, melena, hematochezia  or hemorrhoids. Genitourinary: Denies dysuria, frequency, urgency, nocturia, hesitancy, discharge, hematuria or flank pain. Musculoskeletal: Denies arthralgias, myalgias, stiffness, jt. swelling, pain, limping or strain/sprain.  Skin: Denies pruritus, rash, hives, warts, acne, eczema or change in skin lesion(s). Neuro: No weakness, tremor, incoordination, spasms, paresthesia or pain. Psychiatric: Denies confusion, memory loss or sensory loss. Endo: Denies change in weight, skin or hair change.  Heme/Lymph: No excessive bleeding, bruising or enlarged lymph nodes.  Physical Exam  BP 114/70   Pulse 81   Temp (!) 97.1 F (36.2 C)   Resp 17   Ht 5' 2.5" (1.588 m)   Wt 149 lb 6.4 oz (67.8 kg)   SpO2 96%   BMI 26.89 kg/m   Appears  well  nourished, well groomed  and in no distress.  Eyes: PERRLA, EOMs, conjunctiva no swelling or erythema. Sinuses: No frontal/maxillary tenderness ENT/Mouth: EAC's clear, TM's nl w/o erythema, bulging. Nares clear w/o erythema, swelling, exudates. Oropharynx clear without erythema or exudates. Oral hygiene is good. Tongue normal, non obstructing. Hearing intact.  Neck: Supple. Thyroid not palpable. Car 2+/2+ without bruits, nodes or JVD. Chest: Respirations nl with BS clear & equal w/o rales, rhonchi, wheezing or stridor.  Cor: Heart sounds normal w/ regular rate and rhythm without sig. murmurs, gallops, clicks or rubs. Peripheral pulses normal and equal  without edema.  Abdomen: Soft & bowel sounds normal. Non-tender w/o guarding, rebound, hernias, masses or organomegaly.  Lymphatics: Unremarkable.  Musculoskeletal: Full ROM all peripheral extremities, joint stability, 5/5 strength and normal gait.  Skin: Warm, dry without exposed rashes, lesions or ecchymosis apparent.  Neuro: Cranial nerves intact, reflexes equal bilaterally. Sensory-motor testing grossly intact. Tendon reflexes grossly intact.  Pysch: Alert & oriented x 3.  Insight and judgement nl & appropriate. No ideations.  Assessment and Plan:  1. Essential hypertension  - Continue medication, monitor blood pressure at home.  - Continue DASH diet.  Reminder to go to the ER if any CP,  SOB, nausea, dizziness, severe HA, changes vision/speech.            - CBC with Differential/Platelet - COMPLETE METABOLIC PANEL WITH GFR -  Magnesium - TSH  2. Hyperlipidemia, mixed  - Continue diet/meds, exercise,& lifestyle modifications.  - Continue monitor periodic cholesterol/liver & renal functions    - Lipid panel - TSH  3. Abnormal glucose  - Continue diet, exercise  - Lifestyle modifications.  - Monitor appropriate labs.   - Hemoglobin A1c - Insulin, random  4. Vitamin D deficiency  - Continue supplementation.    -  VITAMIN D 25 Hydroxy  5. Coronary artery disease involving native coronary  artery of native heart without angina pectoris  - Lipid panel  6. Gastroesophageal reflux disease without esophagitis  - CBC with Differential/Platelet  7. Atherosclerosis of aorta ([place]) by Lumbar CYT scan 01/29/2021  - Lipid panel  8. Medication management  - CBC with Differential/Platelet - COMPLETE METABOLIC PANEL WITH GFR - Magnesium - Lipid panel - TSH - Hemoglobin A1c - Insulin, random - VITAMIN D 25 Hydroxy         Discussed  regular exercise, BP monitoring, weight control to achieve/maintain BMI less than 25 and discussed med and SE's. Recommended labs to assess and monitor clinical status with further disposition pending results of labs.  I discussed the assessment and treatment plan with the patient. The patient was provided an opportunity to ask questions and all were answered. The patient agreed with the plan and demonstrated an understanding of the instructions.  I provided over 30 minutes of exam, counseling, chart review and  complex critical decision making.        The patient was advised to call back or seek an in-person evaluation if the symptoms worsen or if the condition fails to improve as anticipated.   Kirtland Bouchard, MD

## 2021-02-18 ENCOUNTER — Ambulatory Visit (INDEPENDENT_AMBULATORY_CARE_PROVIDER_SITE_OTHER): Payer: PPO | Admitting: Internal Medicine

## 2021-02-18 ENCOUNTER — Other Ambulatory Visit: Payer: Self-pay

## 2021-02-18 ENCOUNTER — Encounter: Payer: Self-pay | Admitting: Internal Medicine

## 2021-02-18 VITALS — BP 114/70 | HR 81 | Temp 97.1°F | Resp 17 | Ht 62.5 in | Wt 149.4 lb

## 2021-02-18 DIAGNOSIS — E782 Mixed hyperlipidemia: Secondary | ICD-10-CM

## 2021-02-18 DIAGNOSIS — I2510 Atherosclerotic heart disease of native coronary artery without angina pectoris: Secondary | ICD-10-CM

## 2021-02-18 DIAGNOSIS — I10 Essential (primary) hypertension: Secondary | ICD-10-CM

## 2021-02-18 DIAGNOSIS — I700 Atherosclerosis of aorta: Secondary | ICD-10-CM | POA: Diagnosis not present

## 2021-02-18 DIAGNOSIS — R7309 Other abnormal glucose: Secondary | ICD-10-CM | POA: Diagnosis not present

## 2021-02-18 DIAGNOSIS — K219 Gastro-esophageal reflux disease without esophagitis: Secondary | ICD-10-CM | POA: Diagnosis not present

## 2021-02-18 DIAGNOSIS — E559 Vitamin D deficiency, unspecified: Secondary | ICD-10-CM | POA: Diagnosis not present

## 2021-02-18 DIAGNOSIS — Z79899 Other long term (current) drug therapy: Secondary | ICD-10-CM

## 2021-02-19 LAB — CBC WITH DIFFERENTIAL/PLATELET
Absolute Monocytes: 664 cells/uL (ref 200–950)
Basophils Absolute: 64 cells/uL (ref 0–200)
Basophils Relative: 0.7 %
Eosinophils Absolute: 701 cells/uL — ABNORMAL HIGH (ref 15–500)
Eosinophils Relative: 7.7 %
HCT: 45.1 % — ABNORMAL HIGH (ref 35.0–45.0)
Hemoglobin: 15.1 g/dL (ref 11.7–15.5)
Lymphs Abs: 2193 cells/uL (ref 850–3900)
MCH: 31.7 pg (ref 27.0–33.0)
MCHC: 33.5 g/dL (ref 32.0–36.0)
MCV: 94.5 fL (ref 80.0–100.0)
MPV: 10.8 fL (ref 7.5–12.5)
Monocytes Relative: 7.3 %
Neutro Abs: 5478 cells/uL (ref 1500–7800)
Neutrophils Relative %: 60.2 %
Platelets: 365 10*3/uL (ref 140–400)
RBC: 4.77 10*6/uL (ref 3.80–5.10)
RDW: 13.1 % (ref 11.0–15.0)
Total Lymphocyte: 24.1 %
WBC: 9.1 10*3/uL (ref 3.8–10.8)

## 2021-02-19 LAB — HEMOGLOBIN A1C
Hgb A1c MFr Bld: 5.7 % of total Hgb — ABNORMAL HIGH (ref <5.7)
Mean Plasma Glucose: 117 mg/dL
eAG (mmol/L): 6.5 mmol/L

## 2021-02-19 LAB — COMPLETE METABOLIC PANEL WITH GFR
AG Ratio: 1.6 (calc) (ref 1.0–2.5)
ALT: 13 U/L (ref 6–29)
AST: 15 U/L (ref 10–35)
Albumin: 4.5 g/dL (ref 3.6–5.1)
Alkaline phosphatase (APISO): 50 U/L (ref 37–153)
BUN: 12 mg/dL (ref 7–25)
CO2: 27 mmol/L (ref 20–32)
Calcium: 9.7 mg/dL (ref 8.6–10.4)
Chloride: 102 mmol/L (ref 98–110)
Creat: 0.86 mg/dL (ref 0.60–0.95)
Globulin: 2.8 g/dL (calc) (ref 1.9–3.7)
Glucose, Bld: 97 mg/dL (ref 65–99)
Potassium: 4.1 mmol/L (ref 3.5–5.3)
Sodium: 140 mmol/L (ref 135–146)
Total Bilirubin: 0.7 mg/dL (ref 0.2–1.2)
Total Protein: 7.3 g/dL (ref 6.1–8.1)
eGFR: 67 mL/min/{1.73_m2} (ref >=60)

## 2021-02-19 LAB — LIPID PANEL
Cholesterol: 146 mg/dL (ref <200)
HDL: 67 mg/dL (ref >=50)
LDL Cholesterol (Calc): 61 mg/dL (calc)
Non-HDL Cholesterol (Calc): 79 mg/dL (calc) (ref <130)
Total CHOL/HDL Ratio: 2.2 (calc) (ref <5.0)
Triglycerides: 96 mg/dL (ref <150)

## 2021-02-19 LAB — INSULIN, RANDOM: Insulin: 14.9 u[IU]/mL

## 2021-02-19 LAB — MAGNESIUM: Magnesium: 2 mg/dL (ref 1.5–2.5)

## 2021-02-19 LAB — VITAMIN D 25 HYDROXY (VIT D DEFICIENCY, FRACTURES): Vit D, 25-Hydroxy: 94 ng/mL (ref 30–100)

## 2021-02-19 LAB — TSH: TSH: 0.88 mIU/L (ref 0.40–4.50)

## 2021-02-19 NOTE — Progress Notes (Signed)
============================================================ ============================================================  -    Total Chol = 146       &        LDL Chol = 61         - Both         Excellent   - Very low risk for Heart Attack  / Stroke ============================================================ ============================================================  -  A1c = 5.7% - Better - almost back to Normal  ============================================================ ============================================================  -  Vitamin D = 94   -   Excellent - Please keep dose same  ============================================================ ============================================================  -  All Else - CBC - Kidneys - Electrolytes - Liver - Magnesium & Thyroid    - all  Normal / OK ============================================================ ============================================================

## 2021-02-20 ENCOUNTER — Encounter: Payer: Self-pay | Admitting: Internal Medicine

## 2021-02-22 ENCOUNTER — Ambulatory Visit: Payer: PPO | Admitting: Internal Medicine

## 2021-02-22 NOTE — Progress Notes (Deleted)
Subjective:   Patient ID: [name] [name], female    DOB: 09-12-1935    MRN: [id number]     Brief patient profile:  64 yowf never smoker with large hiatal hernia & recurrent asthmatic bronchitis , about twice/yr for many yrs. Lung parenchyma looks ok on Ct scans 10/2006 & 12/10. PFTs nml in 12/09 & mild airway obstruction in 08/2009.  First presented 3/09 . CT neck for thyroid imaging s/o mediastinal contour abnormal. Ct chest >> large hiatal hernia & BL upper lobe hazy , ground glass opacity.  Dec, 2010  [name] [name])  Fe def anemia requiring transfusions, small bowel AVMs suspected.  Mar 2011 Acute visit- c/o cough and wheezing >> augmentin helped, enalapril changed to diovan       11/19/2013 acute  ov/[name] [name] Patient re:  [name] [name] since July 4th 2015 w/e Chief Complaint  Patient presents with   Acute Visit    Pt c/o cough, congestion and sinus pressure x 1 wk. Cough is prod with minimal to moderate greenish yellow sputum.     symptoms were acute "like a head cold" then into chest, progressive, with sob with exertion but not at rest Zpak July 10  [facility] 2 weeks prior to OV   got kyphoplasty, then sinus congestion Used neb am of of with good results  rec Augmentin 875 mg take one pill twice daily  X 10 days - take at breakfast and supper with large glass of water.  It would help reduce the usual side effects (diarrhea and yeast infections) if you ate cultured yogurt at lunch.  Prednisone 10 mg take  4 each am x 2 days,   2 each am x 2 days,  1 each am x 2 days and stop  Work on inhaler technique:  relax and gently blow all the way out then take a nice smooth deep breath back in, triggering the inhaler at same time you start breathing in.  Hold for up to 5 seconds if you can.  Rinse and gargle with water when done Return in 2 weeks if not completely better   > did not return      09/08/2017 1st [name] [name] office visit/ [name] [name]  Re AB  Chief Complaint  Patient presents with   [name] Consult    Last  seen here in July 2015. She states that she is here for just "check up". Breathing is overall doing well.  She rarely uses her albuterol inhaler.   uses dulera 200 when over does it  Sleeping s resp problems  No cough  Walks at [employer] at a slow pace with a cane and not really limited by doe   rec In the event of cough/ wheeze or worse short of breath > dulera  100 or 200 up to 2 pffs every 12 hours Work on inhaler technique:   Plan B = Backup Only use your albuterol as a rescue medication Plan C = Crisis - only use your albuterol nebulizer if you first try Plan B and it fails to help > ok to use the nebulizer up to every 4 hours but if start needing it regularly call for immediate appointment    07/20/20 NP eval for acute exac rec Zpack take as directed  Mucinex DM liquid Twice daily  As needed  Cough/congestion  Prednisone taper over next week .  Albuterol inhaler As needed   Continue on BREZTRI 2 puffs Twice daily , brush/rinse/gargle after use.  Follow up with Dr. Melvyn [name]  In 6-8  weeks with PFTs and As needed  Please contact office for sooner follow up if symptoms do not improve or worsen or seek emergency care       09/02/2020  Re-establish ov/[name] [name] re: ? Asthma maint on advair 250/50 Chief Complaint  Patient presents with   Follow-up    Had PFT today-sob worse at times, cough yellow   Dyspnea:  Steps x one flight > sob/ shops anywhere at nl pace = MMRC1 = can walk nl pace, flat grade, can't hurry or go uphills or steps s sob   Cough: assoc with sob flares   Sleeping:bed is 10 degrees/ two pillows on side SABA use: no change  02: none  Covid status:   X 3 vax rec Change advair to 250/50 one twice daily  Please schedule a follow up office visit in 6 weeks, call sooner if needed  Error:  We originally recorded advair at 500 but it was 250 so the reduction should have been to the 100/50 bid/ pt informed by phone of th error on the avs but the correct rx was submitted for the  100/50 strength   10/14/2020  f/u ov/[name] [name] re:    Asthma worse cough  actually  on 500/50 after all (very confused with names of meds and strengths) "it's whatever they gave me"  No chief complaint on file. Dyspnea: MMRC1 = can walk nl pace, flat grade, can't hurry or go uphills or steps s sob   Cough: worse with exertion esp outdoors  Sleeping: 10 degrees/ on side with 2pillows under head  SABA use: not using  02: none Covid status:   vax x 3   Rec Plan A = Automatic = Always=    dulera 100 (or Symbicort 80) Take 2 puffs first thing in am and then another 2 puffs about 12 hours later.  Work on inhaler technique:   Plan B = Backup (to supplement plan A, not to replace it) Only use your albuterol inhaler as a rescue medication  Please schedule a follow up visit in 3 months but call sooner if needed     02/22/2021  f/u ov/[name] [name] re: ***   maint on ***  No chief complaint on file.   Dyspnea:  *** Cough: *** Sleeping: *** SABA use: *** 02: *** Covid status:   ***   No obvious day to day or daytime variability or assoc excess/ purulent sputum or mucus plugs or hemoptysis or cp or chest tightness, subjective wheeze or overt sinus or hb symptoms.   *** without nocturnal  or early am exacerbation  of respiratory  c/o's or need for noct saba. Also denies any obvious fluctuation of symptoms with weather or environmental changes or other aggravating or alleviating factors except as outlined above   No unusual exposure hx or h/o childhood pna/ asthma or knowledge of premature birth.  Current Allergies, Complete Past Medical History, Past Surgical History, Family History, and Social History were reviewed in [employer] record.  ROS  The following are not active complaints unless bolded Hoarseness, sore throat, dysphagia, dental problems, itching, sneezing,  nasal congestion or discharge of excess mucus or purulent secretions, ear ache,   fever, chills, sweats, unintended  wt loss or wt gain, classically pleuritic or exertional cp,  orthopnea pnd or arm/hand swelling  or leg swelling, presyncope, palpitations, abdominal pain, anorexia, nausea, vomiting, diarrhea  or change in bowel habits or change in bladder habits, change in stools or change in urine, dysuria,  hematuria,  rash, arthralgias, visual complaints, headache, numbness, weakness or ataxia or problems with walking or coordination,  change in mood or  memory.        No outpatient medications have been marked as taking for the 02/22/21 encounter (Appointment) with [name], MD.   Current Facility-Administered Medications for the 02/22/21 encounter (Appointment) with [name], MD  Medication   calcitonin (salmon) (MIACALCIN/FORTICAL) nasal spray 1 spray                   Objective:   Physical Exam    02/22/2021       ***  10/14/2020          150 09/02/2020        152   09/08/2017         164   11/19/13 132 lb (59.875 kg)  11/14/13 127 lb (57.607 kg)  11/05/13 125 lb (56.7 kg)      Vital signs reviewed  02/22/2021  - Note at rest 02 sats  ***% on ***   General appearance:    ***    LUNGS: no acc muscle use,  Nl contour chest coarse insp/exp rhonchi  Bilaterally much transmitted from upper airway(***             Assessment & Plan:

## 2021-02-26 ENCOUNTER — Other Ambulatory Visit: Payer: Self-pay | Admitting: Adult Health

## 2021-03-01 ENCOUNTER — Telehealth: Payer: Self-pay

## 2021-03-01 NOTE — Progress Notes (Addendum)
Patient rescheduled initial visit from 03/02/21 to 03/11/21 @9 :30am.   Hildred Alamin, Health Concierge  I have reviewed the care management and care coordination activities outlined in this encounter and I am certifying that I agree with the content of this note.  Rachelle Hora Jeannett Senior, PharmD  Clinical Pharmacist  [email or web address]  [phone]

## 2021-03-01 NOTE — Progress Notes (Deleted)
Chronic Care Management Pharmacy Note  03/01/2021 Name:  JANALYN HIGBY MRN:  [id number] DOB:  1936/03/27  Summary: ***  Recommendations/Changes made from today's visit: ***  Plan: ***   Subjective: NAELANI LAFRANCE is an 85 y.o. year old female who is a primary patient of [name], MD.  The CCM team was consulted for assistance with disease management and care coordination needs.    Engaged with patient face to face for initial visit in response to provider referral for pharmacy case management and/or care coordination services.   Consent to Services:  The patient was given the following information about Chronic Care Management services today, agreed to services, and gave verbal consent: 1. CCM service includes personalized support from designated clinical staff supervised by the primary care provider, including individualized plan of care and coordination with other care providers 2. 24/7 contact phone numbers for assistance for urgent and routine care needs. 3. Service will only be billed when office clinical staff spend 20 minutes or more in a month to coordinate care. 4. Only one practitioner may furnish and bill the service in a calendar month. 5.The patient may stop CCM services at any time (effective at the end of the month) by phone call to the office staff. 6. The patient will be responsible for cost sharing (co-pay) of up to 20% of the service fee (after annual deductible is met). Patient agreed to services and consent obtained.  Patient Care Team: [name], MD as PCP - General (Internal Medicine) Jerline Pain, MD as PCP - Cardiology (Cardiology) Inda Castle, MD (Inactive) as Consulting Physician (Gastroenterology) Rigoberto Noel, MD as Consulting Physician (Pulmonary Disease) Larey Dresser, MD as Consulting Physician (Cardiology) Michael Boston, MD as Consulting Physician (General Surgery) Jessy Oto, MD as Consulting Physician (Orthopedic  Surgery) Philemon Kingdom, MD as Consulting Physician (Endocrinology) Curt Bears, MD as Consulting Physician (Oncology) [facility], [facility] as Pharmacist (Pharmacist)  Recent office visits: ***  Recent consult visits: [facility] visits: {Hospital DC Yes/No:25215}   Objective:  Lab Results  Component Value Date   CREATININE 0.86 02/18/2021   BUN 12 02/18/2021   GFR 66.92 12/20/2010   GFRNONAA 61 10/15/2020   GFRAA 71 10/15/2020   NA 140 02/18/2021   K 4.1 02/18/2021   CALCIUM 9.7 02/18/2021   CO2 27 02/18/2021   GLUCOSE 97 02/18/2021    Lab Results  Component Value Date/Time   HGBA1C 5.7 (H) 02/18/2021 10:32 AM   HGBA1C 5.9 (H) 10/15/2020 09:46 AM   GFR 66.92 12/20/2010 10:21 AM   MICROALBUR 0.9 10/15/2020 09:46 AM   MICROALBUR 0.9 09/11/2018 01:19 PM    Last diabetic Eye exam: No results found for: HMDIABEYEEXA  Last diabetic Foot exam: No results found for: HMDIABFOOTEX   Lab Results  Component Value Date   CHOL 146 02/18/2021   HDL 67 02/18/2021   LDLCALC 61 02/18/2021   TRIG 96 02/18/2021   CHOLHDL 2.2 02/18/2021    Hepatic Function Latest Ref Rng & Units 02/18/2021 10/15/2020 09/17/2020  Total Protein 6.1 - 8.1 g/dL 7.3 6.8 6.3  Albumin 3.5 - 5.2 g/dL - - -  AST 10 - 35 U/L _0 ALT 6 - 29 U/L _1 Alk Phosphatase 39 - 117 U/L - - -  Total Bilirubin 0.2 - 1.2 mg/dL 0.7 0.6 0.7  Bilirubin, Direct 0.0 - 0.3 mg/dL - - -    Lab Results  Component Value Date/Time  TSH 0.88 02/18/2021 10:32 AM   TSH 0.75 10/15/2020 09:46 AM   FREET4 1.11 02/17/2010 05:55 AM    CBC Latest Ref Rng & Units 02/18/2021 01/04/2021 10/15/2020  WBC 3.8 - 10.8 Thousand/uL 9.1 8.6 7.8  Hemoglobin 11.7 - 15.5 g/dL 15.1 13.8 14.5  Hematocrit 35.0 - 45.0 % 45.1(H) 41.3 42.6  Platelets 140 - 400 Thousand/uL 365 338.0 341    Lab Results  Component Value Date/Time   VD25OH 94 02/18/2021 10:32 AM   VD25OH 97 10/15/2020 09:46 AM   VD25OH 61.05 02/10/2014 10:06 AM     Clinical ASCVD: {YES/NO:21197} The ASCVD Risk score (Arnett DK, et al., 2019) failed to calculate for the following reasons:   The 2019 ASCVD risk score is only valid for ages 55 to 78    Depression screen PHQ 2/9 10/15/2020 07/22/2020 07/22/2020  Decreased Interest 0 0 0  Down, Depressed, Hopeless 0 0 0  PHQ - 2 Score 0 0 0  Altered sleeping - 0 -  Tired, decreased energy - 0 -  Change in appetite - 0 -  Feeling bad or failure about yourself  - 0 -  Trouble concentrating - 0 -  Moving slowly or fidgety/restless - 0 -  Suicidal thoughts - 0 -  PHQ-9 Score - 0 -  Difficult doing work/chores - Not difficult at all -  Some recent data might be hidden     ***Other: (CHADS2VASc if Afib, MMRC or CAT for COPD, ACT, DEXA)  Social History   Tobacco Use  Smoking Status Never  Smokeless Tobacco Never   BP Readings from Last 3 Encounters:  02/18/21 114/70  01/29/21 (!) 158/80  01/22/21 138/76   Pulse Readings from Last 3 Encounters:  02/18/21 81  01/29/21 74  01/22/21 73   Wt Readings from Last 3 Encounters:  02/18/21 149 lb 6.4 oz (67.8 kg)  01/22/21 149 lb (67.6 kg)  01/04/21 149 lb (67.6 kg)   BMI Readings from Last 3 Encounters:  02/18/21 26.89 kg/m  01/22/21 26.82 kg/m  01/04/21 26.82 kg/m    Assessment/Interventions: Review of patient past medical history, allergies, medications, health status, including review of consultants reports, laboratory and other test data, was performed as part of comprehensive evaluation and provision of chronic care management services.   SDOH:  (Social Determinants of Health) assessments and interventions performed: {yes/no:20286}  SDOH Screenings   Alcohol Screen: Not on file  Depression (PHQ2-9): Low Risk    PHQ-2 Score: 0  Financial Resource Strain: Not on file  Food Insecurity: Not on file  Housing: Not on file  Physical Activity: Not on file  Social Connections: Not on file  Stress: Not on file  Tobacco Use: Low Risk     Smoking Tobacco Use: Never   Smokeless Tobacco Use: Never   Passive Exposure: Not on file  Transportation Needs: Not on file    CCM Care Plan  Allergies  Allergen Reactions   Ace Inhibitors Cough    Per Dr Melvyn Novas pulmonology 2013   Aspartame Diarrhea and Nausea And Vomiting   Atorvastatin Other (See Comments)    Muscle aches   Biaxin [Clarithromycin] Other (See Comments)    Unknown reaction : PATIENT CAN TOLERATE Z-PAK.  Is written on patient's paper chart.   Daliresp [Roflumilast] Nausea And Vomiting   Erythromycin Other (See Comments)    Unknown reaction: PATIENT CAN TOLERATE Z-PAK.  It is written on patient's paper chart.   Levofloxacin Nausea And Vomiting    Medications Reviewed  Today     Reviewed by [name], MD (Physician) on 02/20/21 at 2218  Med List Status: <None>   Medication Order Taking? Sig Documenting Provider Last Dose Status Informant  amLODipine (NORVASC) 10 MG tablet [id number] Yes TAKE 1 TABLET BY MOUTH ONCE DAILY FOR BLOOD PRESSURE AND FOR HEART Magda Bernheim, NP Taking Active   aspirin EC 81 MG tablet [id number] Yes Take 81 mg by mouth at bedtime.  [provider] Taking Active Self  budesonide-formoterol (SYMBICORT) 80-4.5 MCG/ACT inhaler [id number] Yes Take 2 puffs first thing in am and then another 2 puffs about 12 hours later. [name], MD Taking Active   calcitonin (salmon) (MIACALCIN/FORTICAL) nasal spray 1 spray [id number]   Jessy Oto, MD  Active   Cholecalciferol (VITAMIN D3) 5000 units CAPS [id number] Yes Take 5,000 Units by mouth 2 (two) times daily.  [provider] Taking Active Self  cyclobenzaprine (FLEXERIL) 10 MG tablet [id number] Yes Take  1 tablet  3 x /day  as needed for back pain /muscle spasm [name], MD Taking Active   denosumab (PROLIA) 60 MG/ML SOSY injection [id number] Yes Inject 60 mg into the skin every 6 (six) months. [name], MD Taking Active Self  diclofenac sodium (VOLTAREN) 1 %  GEL [id number] Yes Apply 2 g topically 4 (four) times daily.  Patient taking differently: Apply 2 g topically 4 (four) times daily as needed (pain).   Loura Halt A, NP Taking Active   gabapentin (NEURONTIN) 300 MG capsule [id number] Yes Take 1 capsule (300 mg total) by mouth 4 (four) times daily. Eugenie Filler, MD Taking Active   ketoconazole (NIZORAL) 2 % cream [id number] Yes Apply 1 application topically 2 x /day  Patient taking differently: Apply 1 application topically 2 (two) times daily. Apply 1 application topically 2 x /day   [name], MD Taking Active Self  Magnesium 400 MG TABS [id number] Yes Take 400 mg by mouth daily at 2 PM. [provider] Taking Active Self  meclizine (ANTIVERT) 25 MG tablet [id number] Yes Take 1/2 to 1 tablet 2 to 3 x /day ONLY if needed  For Dizziness / Vertigo  Patient taking differently: Take 12.5-25 mg by mouth 3 (three) times daily as needed for dizziness. Take 1/2 to 1 tablet 2 to 3 x /day ONLY if needed  For Dizziness / Vertigo   [name], MD Taking Active Self  metoprolol tartrate (LOPRESSOR) 50 MG tablet [id number] Yes TAKE 1 TABLET BY MOUTH TWICE DAILY FOR BLOOD PRESSURE AND FOR HEART [name], NP Taking Active   Multiple Vitamins-Minerals (MULTIVITAMIN WITH MINERALS) tablet [id number] Yes Take 1 tablet by mouth daily. [provider] Taking Active   nitroGLYCERIN (NITROSTAT) 0.4 MG SL tablet [id number] Yes Place 1 tablet (0.4 mg total) under the tongue every 5 (five) minutes as needed for chest pain. Larey Dresser, MD Taking Active Self  omeprazole (PRILOSEC) 40 MG capsule [id number] Yes Take 1 capsule (40 mg total) by mouth daily. Yetta Flock, MD Taking Active   promethazine (PHENERGAN) 25 MG tablet [id number] Yes Take  1 tablet  Every 4 hours ONLY if needed for Nausea & Vomiting                  /TAKE 1 TABLET BY MOUTH EVERY 4 HOURS AS NEEDED FOR NAUSEA AND VOMITING [name], MD Taking Active    rOPINIRole (REQUIP) 2 MG tablet [id number] No Take  1 tablet  3 x /day  for Restless  Legs  Patient not taking: Reported on 02/18/2021   [name], MD Not Taking Active   senna-docusate (SENOKOT-S) 8.6-50 MG tablet [id number] Yes Take 2 tablets by mouth 2 (two) times daily. Eugenie Filler, MD Taking Active   sucralfate (CARAFATE) 1 g tablet [id number] Yes TAKE 1 TABLET BY MOUTH 4 TIMES DAILY BEFORE MEAL(S) AND AT BEDTIME FOR INDIGESTION AND FOR Scheryl Darter, NP Taking Active   trospium (SANCTURA) 20 MG tablet [id number] Yes Take 20 mg by mouth 2 (two) times daily. [provider] Taking Active Self  vitamin B-12 (CYANOCOBALAMIN) 1000 MCG tablet [id number] Yes Take 1,000 mcg by mouth daily. [provider] Taking Active Self            Patient Active Problem List   Diagnosis Date Noted   Iron deficiency 10/16/2020   History of adenomatous polyp of colon 07/21/2020   Atypical chest pain 07/08/2020   History of fundoplication    Epigastric pain 07/09/2019   Senile purpura ([place]) 07/05/2019   Abnormal glucose (prediabetes) 03/01/2018   FHx: heart disease 03/01/2018   CKD (chronic kidney disease) stage 2, GFR 60-89 ml/min 09/27/2017   T12 compression fracture ([place]) 09/26/2017   Chronic bronchitis ([place]) 08/09/2017   Atherosclerosis of aorta ([place]) by Lumbar CYT scan 01/29/2021 04/05/2017   Odynophagia    Spinal stenosis, lumbar region, with neurogenic claudication 05/29/2015   DDD (degenerative disc disease), lumbar 04/21/2015   Hyperlipidemia, mixed 11/24/2014   Gastroesophageal reflux disease 11/24/2014   Depression, major, in partial remission ([place]) 09/26/2014   Chronic pain syndrome 09/26/2014   Coronary artery disease involving native heart without angina pectoris    Osteoporosis 02/10/2014   Vitamin D deficiency 08/14/2013   MGUS (monoclonal gammopathy of unknown significance) 03/05/2013   ESOPHAGEAL STRICTURE 08/28/2008   Mild intermittent  asthma without complication 39/53/2023   Incarcerated recurrent paraesophageal hiatal hernia s/p lap redo repair [id number] 01/31/2008   Essential hypertension 07/25/2007    Immunization History  Administered Date(s) Administered   DT (Pediatric) 05/06/2014   Influenza Split 02/07/2011   Influenza Whole 02/09/2009, 01/19/2010, 02/13/2012   Influenza, High Dose Seasonal PF 03/22/2013, 01/15/2014, 02/27/2015, 01/14/2016, 01/18/2017, 02/14/2018, 02/14/2019, 03/16/2020   PFIZER(Purple Top)SARS-COV-2 Vaccination 05/25/2019, 06/15/2019, 03/24/2020   Pneumococcal Conjugate-13 05/06/2014   Pneumococcal Polysaccharide-23 02/09/2011   Pneumococcal-Unspecified 02/06/2010, 02/09/2012   Td 05/10/2003    Conditions to be addressed/monitored:  {USCCMDZASSESSMENTOPTIONS:23563}  There are no care plans that you recently modified to display for this patient.    Medication Assistance: {MEDASSISTANCEINFO:25044}  Compliance/Adherence/Medication fill history: Care Gaps: ***  Star-Rating Drugs: ***  Patient's preferred pharmacy is:  [place] ([place] - [address] [place] [address] Phone: [phone] Fax: [place] [address] Phone: [phone] Fax: [phone]  Uses pill box? {Yes or If no, why not?:20788} Pt endorses ***% compliance  We discussed: {Pharmacy options:24294} Patient decided to: {US Pharmacy Plan:23885}  Care Plan and Follow Up Patient Decision:  {FOLLOWUP:24991}  Plan: {CM FOLLOW UP RZNB:56701}  ***    Current Barriers:  {pharmacybarriers:24917}  Pharmacist Clinical Goal(s):  Patient will {PHARMACYGOALCHOICES:24921} through collaboration with PharmD and provider.   Interventions: 1:1 collaboration with [name], MD regarding development and update of comprehensive plan of care as evidenced by provider attestation and  co-signature Inter-disciplinary care team collaboration (see longitudinal plan of care) Comprehensive medication review performed; medication list updated in electronic medical record  {  CCM PHARMD DISEASE STATES:25130}  Patient Goals/Self-Care Activities Patient will:  - {pharmacypatientgoals:24919}  Follow Up Plan: {CM FOLLOW UP SVEX:46002}

## 2021-03-01 NOTE — Progress Notes (Incomplete Revision)
Patient rescheduled initial visit from 03/02/21 to 03/11/21 @9 :30am.   Hildred Alamin, Health Concierge

## 2021-03-02 ENCOUNTER — Ambulatory Visit: Payer: PPO | Admitting: Pharmacist

## 2021-03-03 ENCOUNTER — Ambulatory Visit (INDEPENDENT_AMBULATORY_CARE_PROVIDER_SITE_OTHER): Payer: PPO | Admitting: Specialist

## 2021-03-03 ENCOUNTER — Other Ambulatory Visit: Payer: Self-pay

## 2021-03-03 ENCOUNTER — Encounter: Payer: Self-pay | Admitting: Specialist

## 2021-03-03 VITALS — BP 132/71 | HR 78 | Ht 63.0 in | Wt 149.0 lb

## 2021-03-03 DIAGNOSIS — M4807 Spinal stenosis, lumbosacral region: Secondary | ICD-10-CM

## 2021-03-03 DIAGNOSIS — M8000XA Age-related osteoporosis with current pathological fracture, unspecified site, initial encounter for fracture: Secondary | ICD-10-CM | POA: Diagnosis not present

## 2021-03-03 DIAGNOSIS — M4326 Fusion of spine, lumbar region: Secondary | ICD-10-CM

## 2021-03-03 DIAGNOSIS — M4726 Other spondylosis with radiculopathy, lumbar region: Secondary | ICD-10-CM

## 2021-03-03 NOTE — Progress Notes (Signed)
Office Visit Note   Patient: [name] [name]           Date of Birth: 1935-09-06           MRN: [id number] Visit Date: 03/03/2021              Requested by: [name], [address] [place],  [place] [address] PCP: [name], MD   Assessment & Plan: Visit Diagnoses:  1. Spinal stenosis of lumbosacral region   2. Fusion of lumbar spine   3. Other spondylosis with radiculopathy, lumbar region   4. Osteoporosis with current pathological fracture, unspecified osteoporosis type, initial encounter     Plan: Avoid bending, stooping and avoid lifting weights greater than 10 lbs. Avoid prolong standing and walking. Avoid frequent bending and stooping  No lifting greater than 10 lbs. May use ice or moist heat for pain. Weight loss is of benefit. Handicap license is approved. Dr. Elta Guadeloupe Phillip"s secretary/Assistant/office will call to arrange for epidural steroid injection right L3 transforamenal.  Blood work today due to severe osteoporosis to reassess for free chains and protein that can be seen with multiple myeloma.  I recommend that you increase walking in the house just the length of the house twice a day to Try and stress bone to increase bone density. Even a slight jump up and down has been shown to improve bone strength.  Follow-Up Instructions: Return in about 3 weeks (around 03/24/2021).   Orders:  No orders of the defined types were placed in this encounter.  No orders of the defined types were placed in this encounter.     Procedures: No procedures performed   Clinical Data: Findings:  CLINICAL DATA:  Spinal stenosis. Lumbar radiculopathy. Right inguinal pain.   EXAM: CT MYELOGRAPHY THORACIC AND LUMBAR SPINE   TECHNIQUE: CT imaging of the thoracic and spine was performed after intrathecal contrast administration. Multiplanar CT image reconstructions were also generated.   COMPARISON:  MRI of the thoracic spine 03/13/2019. MRI  of the lumbar spine 11/07/2017. CT lumbar spine 01/31/2019   FINDINGS: THORACIC   Alignment: No significant listhesis is present. Retropulsed bone is associated with the remote T12 fracture. Straightening of the normal thoracic kyphosis is noted.   Vertebrae: Spinal augmentation is noted at T8, T9, T11 and T12. The remaining per body heights are normal. Endplate sclerotic changes are noted in the lower cervical spine. No focal osseous lesions are present.   Cord: Normal cord morphology. Conus medullaris terminates at L1-2, within normal limits.   Paraspinal and other soft tissues: Atherosclerotic calcifications are present at the aortic arch and descending thoracic aorta. Coronary artery calcifications are present. A moderate-sized hiatal hernia is present. Mild dependent atelectasis is present. Lungs are otherwise clear. No significant pleural process is present.   Disc levels:   T1-2: Negative.   T2-3: Negative.   T3-4: Mild facet hypertrophy is present. No significant stenosis is present.   T4-5: Mild facet hypertrophy is present bilaterally. No significant stenosis is present.   T5-6: Mild facet hypertrophy is present bilaterally. No significant disc protrusion or stenosis is present.   C6-7: Negative.   T7-8: Negative.   T8-9: Slight retropulsed bone and uncovering of the disc is present without significant stenosis. Mild foraminal narrowing is noted bilaterally.   T9-10: Minimal bulging disc is present without significant central canal stenosis. Moderate osseous foraminal narrowing is present bilaterally.   T10-11: Moderate facet hypertrophy is worse on the left. The central canal  is patent. Moderate right and mild left foraminal narrowing is present.   T11-12: Spinal augmentation noted at T11. There is some extrusion of cement into the ventral epidural space without significant stenosis. Mild foraminal narrowing is noted bilaterally.   T12-L1: Minimal  retropulsed bone associated with the L1 fracture. No significant stenosis.   LUMBAR   Segmentation: 5 non rib-bearing lumbar type vertebral bodies are present. The lowest fully formed vertebral body is L5.   Alignment: Grade 1 anterolisthesis is present at L2-3. Slight retrolisthesis is present at L3-4. 4 mm anterolisthesis is present at L4-5.   Vertebrae: Spinal augmentation present at L2 and L4. Spinal augmentation noted at L3 with extrusion of cement into the L2-3 disc space. Pedicle screw and rod fixation present L1-2 and L2-3. Lucency is noted about the L2 screws, left more prominent than right.   Conus medullaris: Extends to the conus medullaris terminates at L1-2, within normal limits level and appears normal.   Paraspinal and other soft tissues: Atherosclerotic calcifications are present in the aorta and branch vessels. No solid organ lesions are present. No significant adenopathy is present.   Disc levels:   L1-2: Moderate facet hypertrophy is noted bilaterally. This results in mild foraminal narrowing bilaterally.   L2-3: Right laminectomy noted. Posterior elements are fused solid. Retropulsed bone partially effaces the ventral CSF. Mild osseous foraminal narrowing is worse left than right.   L3-4: Mild disc bulging is present. Moderate facet hypertrophy and ligamentum flavum thickening noted. Moderate foraminal stenosis is present bilaterally.   L4-5: Advanced facet hypertrophy is present. Right laminectomy noted. Uncovering of a broad-based disc protrusion present. Mild subarticular narrowing is present bilaterally. Moderate foraminal narrowing is worse on the left.   L5-S1: Advanced facet hypertrophy noted bilaterally. Laminectomy present. Bone spurring results in mild subarticular narrowing bilaterally. Moderate bilateral foraminal stenosis is present.   IMPRESSION: 1. Mild foraminal narrowing bilaterally at L1-2 and L2-3 could contribute to the patient's  symptoms. 2. Retropulsed bone at T11 and T12 without significant stenosis. 3. Lucency about the L2 screws, left more prominent than right. This suggests hardware loosening. 4. Spinal augmentation at T8, T9, T11, and T12. 5. Moderate foraminal stenosis bilaterally at L3-4 and L4-5 is worse on the left. 6. Moderate bilateral foraminal stenosis at L5-S1. 7. Mild subarticular narrowing bilaterally at L4-5 and L5-S1. 8. Moderate right and mild left foraminal narrowing at T9-10. 9. Moderate right and mild left foraminal narrowing at T10-11 is present. 10. Aortic Atherosclerosis (ICD10-I70.0). 11. Coronary artery disease.     Electronically Signed   By: San Morelle M.D.   On: 01/31/2021 19:34   Review of the CT Scan shows severe osteoporosis of the sacral and medial iliac wings. There are areas totally devoid of cancellous trabeculae. She has been on a vigorous  Bone building program yet the pelvis demonstrates rather severe demineralization and Areas of voids within the pelvis. Multiple myeloma or marrow replacing process should Be considered. Right sided foramenal stenosis L3 is consistent with her pain pattern.   Subjective: Chief Complaint  Patient presents with   Lower Back - Follow-up    85 year old female with severe osteoporosis on prolia injections per Dr. Estanislado Pandy. She has had numerous kyphoplasty procedures for pathologic osteoporotic compression fractures and has undergone right L1-2 and L2-3 decompression of the neuroforamen and short segment posterior fusion for scoliosis with foramenal stenosis. Loosening of hardware but fusion appears solid. She is now having right knee pain that is worse with standing and  walking. Pain is a burning neurogenic quality. A myelogram and post myelogram CT scan was done.    Review of Systems   Objective: Vital Signs: BP 132/71 (BP Location: Left Arm, Patient Position: Sitting, Cuff Size: Normal)   Pulse 78   Ht _0  (1.6 m)    Wt 149 lb (67.6 kg)   SpO2 95%   BMI 26.39 kg/m   Physical Exam Musculoskeletal:     Lumbar back: Negative right straight leg raise test and negative left straight leg raise test.   Back Exam   Tenderness  The patient is experiencing tenderness in the lumbar.  Range of Motion  Extension:  abnormal  Flexion:  abnormal  Lateral bend right:  abnormal  Lateral bend left:  abnormal  Rotation right:  abnormal  Rotation left:  abnormal   Muscle Strength  Right Quadriceps:  5/5  Left Quadriceps:  5/5  Right Hamstrings:  5/5  Left Hamstrings:  5/5   Tests  Straight leg raise right: negative Straight leg raise left: negative  Reflexes  Patellar:  0/4 Achilles:  0/4  Other  Toe walk: normal Heel walk: normal Sensation: normal Gait: abnormal   Comments:  Tender right SI and right buttock. Motor is normal both legs.    Specialty Comments:  No specialty comments available.  Imaging: No results found.   PMFS History: Patient Active Problem List   Diagnosis Date Noted   Iron deficiency 10/16/2020   History of adenomatous polyp of colon 07/21/2020   Atypical chest pain 07/08/2020   History of fundoplication    Epigastric pain 07/09/2019   Senile purpura ([place]) 07/05/2019   Abnormal glucose (prediabetes) 03/01/2018   FHx: heart disease 03/01/2018   CKD (chronic kidney disease) stage 2, GFR 60-89 ml/min 09/27/2017   T12 compression fracture ([place]) 09/26/2017   Chronic bronchitis ([place]) 08/09/2017   Atherosclerosis of aorta (HCC) by Lumbar CYT scan 01/29/2021 04/05/2017   Odynophagia    Spinal stenosis, lumbar region, with neurogenic claudication 05/29/2015   DDD (degenerative disc disease), lumbar 04/21/2015   Hyperlipidemia, mixed 11/24/2014   Gastroesophageal reflux disease 11/24/2014   Depression, major, in partial remission ([place]) 09/26/2014   Chronic pain syndrome 09/26/2014   Coronary artery disease involving native heart without angina pectoris     Osteoporosis 02/10/2014   Vitamin D deficiency 08/14/2013   MGUS (monoclonal gammopathy of unknown significance) 03/05/2013   ESOPHAGEAL STRICTURE 08/28/2008   Mild intermittent asthma without complication 38/75/6433   Incarcerated recurrent paraesophageal hiatal hernia s/p lap redo repair [id number] 01/31/2008   Essential hypertension 07/25/2007   Past Medical History:  Diagnosis Date   Anxiety    takes Xanax daily as needed   Asthma    Albuterol daily as needed   Blood transfusion without reported diagnosis    CAD (coronary artery disease)    LHC 2003 with 40% pLAD, 30% mLAD, 100% D1 (moderate vessel), 100%  D2 (small vessel), 95% mid small OM2.  Pt had PTCA to D1;  D2 and OM2 small vessels and tx medically;  Last Myoview (2012): anterior infarct seen with scar, no ischemia. EF normal. Med Rx // Myoview (12/14):  Low risk; ant scar w/ peri-infarct ischemia; EF 55% with ant AK // Myoview 5/16: EF 55-65, ant, apical scar, no ischemia    Cataract    Chronic back pain    HNP, DDD, spinal stenosis, vertebral compression fractures.    Chronic nausea    takes Zofran daily as needed.  normal gastric emptying  study 03/2013   CKD (chronic kidney disease), stage III ([place]) 09/27/2017   Constipation    felt to be functional. takes Senokot and Miralax daily.  treated with Linzess in past.    DIVERTICULOSIS, COLON 08/28/2008   Qualifier: Diagnosis of  By: [name], [name], [name]     Elevated LFTs 2015   probably med induced DILI, from Augmentin vs Pravastatin   GERD (gastroesophageal reflux disease)    hx of esophageal stricture    Hiatal hernia    Paraesophageal hernias. s/p fundoplications in 8841 and 04/2013   History of bronchitis several of yrs ago   History of colon polyps 03/2009, 03/2011   adenomatous colon polyps, no high grade dysplasia.    History of vertigo    no meds   Hyperlipidemia    takes Pravastatin daily   Hypertension    takes Amlodipine,Metoprolol,and Losartan daily    Multiple closed pelvic fractures without disruption of pelvic circle (HCC)    Nausea 03/2016   Osteoporosis    PONV (postoperative nausea and vomiting)    yrs ago   Pubic ramus fracture (HCC) 12/20/2019   Slow urinary stream    takes Rapaflo daily   TIA (transient ischemic attack) 1995   no residual effects noted   Vertebral compression fracture ([place]) 06/23/2012    Family History  Problem Relation Age of Onset   Colon cancer Mother 33   Heart attack Father 67       heart attack   Brain cancer Brother        tumor    Heart disease Brother    Heart disease Brother    Kidney disease Daughter    Esophageal cancer Neg Hx    Stomach cancer Neg Hx     Past Surgical History:  Procedure Laterality Date   APPENDECTOMY     patient unsure of date   BACK [place]     Patient unsure of date.   CORONARY ANGIOPLASTY  11/16/2001   1 stent   ESOPHAGEAL MANOMETRY N/A 03/25/2013   Procedure: ESOPHAGEAL MANOMETRY (EM);  Surgeon: Inda Castle, MD;  Location: [facility] ENDOSCOPY;  Service: Endoscopy;  Laterality: N/A;   ESOPHAGOGASTRODUODENOSCOPY N/A 03/15/2013   Procedure: ESOPHAGOGASTRODUODENOSCOPY (EGD);  Surgeon: Jerene Bears, MD;  [facility] ENDOSCOPY;  Service: Endoscopy;  Laterality: N/A;   ESOPHAGOGASTRODUODENOSCOPY N/A 12/25/2015   Procedure: ESOPHAGOGASTRODUODENOSCOPY (EGD);  Surgeon: Doran Stabler, MD;  Location[facility] END[facility]COPY;  Service: Endoscopy;  Laterality: N/A;   EYE SURGERY  11/2000   bilateral cataracts with lens implant   FIXATION KYPHOPLASTY LUMBAR SPINE  X 2   "L3-4"   HEMORRHOID SURGERY     HIATAL HERNIA REPAIR  06/03/2011   Procedure: LAPAROSCOPIC REPAIR OF HIATAL HERNIA;  Surgeon: Adin Hector, MD;  Location: [facility] ORS;  Service: General;  Laterality: N/A;   HIATAL HERNIA REPAIR N/A 04/24/2013   Procedure: LAPAROSCOPIC  REPAIR RECURRENT PARASOPHAGEAL HIATAL HERNIA WITH FUNDOPLICATION;  Surgeon: Adin Hector, MD;  Location: [facility]  ORS;  Service: General;  Laterality: N/A;   INSERTION OF MESH N/A 04/24/2013   Procedure: INSERTION OF MESH;  Surgeon: Adin Hector, MD;  Location: [facility] ORS;  Service: General;  Laterality: N/A;   IR KYPHO THORACIC WITH BONE BIOPSY  09/29/2017   IR KYPHO THORACIC WITH BONE BIOPSY  11/03/2017   IR KYPHO THORACIC WITH BONE BIOPSY  02/14/2019   IR KYPHO THORACIC WITH BONE BIOPSY  04/02/2019   LAPAROSCOPIC LYSIS OF ADHESIONS N/A 04/24/2013   Procedure: LAPAROSCOPIC LYSIS OF ADHESIONS;  Surgeon: Adin Hector, MD;  Location: [facility] ORS;  Service: General;  Laterality: N/A;   LAPAROSCOPIC NISSEN FUNDOPLICATION  4/47/1580   Procedure: LAPAROSCOPIC NISSEN FUNDOPLICATION;  Surgeon: Adin Hector, MD;  Location: [facility] ORS;  Service: General;  Laterality: N/A;   LUMBAR FUSION  12/07/2015   Right L2-3 facetectomy, posterolateral fusion L2-3 with pedicle screws, rods, local bone graft, Vivigen cancellous chips. Fusion extended to the L1 level with pedicle screws and rods   LUMBAR LAMINECTOMY/DECOMPRESSION MICRODISCECTOMY Right 06/26/2012   Procedure: LUMBAR LAMINECTOMY/DECOMPRESSION MICRODISCECTOMY;  Surgeon: Jessy Oto, MD;  Location: [facility];  Service: Orthopedics;  Laterality: Right;  RIGHT L4-5 MICRODISCECTOMY   LUMBAR LAMINECTOMY/DECOMPRESSION MICRODISCECTOMY N/A 05/29/2015   Procedure: RIGHT L2-3 MICRODISCECTOMY;  Surgeon: Jessy Oto, MD;  Location: [facility];  Service: Orthopedics;  Laterality: N/A;   TOTAL ABDOMINAL HYSTERECTOMY  1975   partial   UPPER GASTROINTESTINAL ENDOSCOPY     Social History   Occupational History   Occupation: Retired    Fish farm manager: RETIRED    Comment: worked at [place] Use   Smoking status: Never   Smokeless tobacco: Never  Vaping Use   Vaping Use: Never used  Substance and Sexual Activity   Alcohol use: No   Drug use: Never   Sexual activity: Not Currently

## 2021-03-03 NOTE — Patient Instructions (Addendum)
Plan: Avoid bending, stooping and avoid lifting weights greater than 10 lbs. Avoid prolong standing and walking. Avoid frequent bending and stooping  No lifting greater than 10 lbs. May use ice or moist heat for pain. Weight loss is of benefit. Handicap license is approved. Dr. Mark Phillip"s secretary/Assistant/office will call to arrange for epidural steroid injection right L3 transforamenal.  Blood work today due to severe osteoporosis to reassess for free chains and protein that can be seen with multiple myeloma.  I recommend that you increase walking in the house just the length of the house twice a day to Try and stress bone to increase bone density. Even a slight jump up and down has been shown to improve bone strength. 

## 2021-03-04 NOTE — Progress Notes (Deleted)
Office Visit Note  Patient: [name] [name]             Date of Birth: 1935-05-23           MRN: [id number]             PCP: [name], MD Referring: [name], MD Visit Date: 03/18/2021 Occupation: @GUAROCC @  Subjective:  No chief complaint on file.   History of Present Illness: [name] [name] is a 84 y.o. female ***   Activities of Daily Living:  Patient reports morning stiffness for *** {minute/hour:19697}.   Patient {ACTIONS;DENIES/REPORTS:21021675::"Denies"} nocturnal pain.  Difficulty dressing/grooming: {ACTIONS;DENIES/REPORTS:21021675::"Denies"} Difficulty climbing stairs: {ACTIONS;DENIES/REPORTS:21021675::"Denies"} Difficulty getting out of chair: {ACTIONS;DENIES/REPORTS:21021675::"Denies"} Difficulty using hands for taps, buttons, cutlery, and/or writing: {ACTIONS;DENIES/REPORTS:21021675::"Denies"}  No Rheumatology ROS completed.   PMFS History:  Patient Active Problem List   Diagnosis Date Noted   Iron deficiency 10/16/2020   History of adenomatous polyp of colon 07/21/2020   Atypical chest pain 07/08/2020   History of fundoplication    Epigastric pain 07/09/2019   Senile purpura ([place]) 07/05/2019   Abnormal glucose (prediabetes) 03/01/2018   FHx: heart disease 03/01/2018   CKD (chronic kidney disease) stage 2, GFR 60-89 ml/min 09/27/2017   T12 compression fracture ([place]) 09/26/2017   Chronic bronchitis ([place]) 08/09/2017   Atherosclerosis of aorta (HCC) by Lumbar CYT scan 01/29/2021 04/05/2017   Odynophagia    Spinal stenosis, lumbar region, with neurogenic claudication 05/29/2015   DDD (degenerative disc disease), lumbar 04/21/2015   Hyperlipidemia, mixed 11/24/2014   Gastroesophageal reflux disease 11/24/2014   Depression, major, in partial remission ([place]) 09/26/2014   Chronic pain syndrome 09/26/2014   Coronary artery disease involving native heart without angina pectoris    Osteoporosis 02/10/2014   Vitamin D deficiency 08/14/2013   MGUS  (monoclonal gammopathy of unknown significance) 03/05/2013   ESOPHAGEAL STRICTURE 08/28/2008   Mild intermittent asthma without complication 29/52/8413   Incarcerated recurrent paraesophageal hiatal hernia s/p lap redo repair [id number] 01/31/2008   Essential hypertension 07/25/2007    Past Medical History:  Diagnosis Date   Anxiety    takes Xanax daily as needed   Asthma    Albuterol daily as needed   Blood transfusion without reported diagnosis    CAD (coronary artery disease)    LHC 2003 with 40% pLAD, 30% mLAD, 100% D1 (moderate vessel), 100%  D2 (small vessel), 95% mid small OM2.  Pt had PTCA to D1;  D2 and OM2 small vessels and tx medically;  Last Myoview (2012): anterior infarct seen with scar, no ischemia. EF normal. Med Rx // Myoview (12/14):  Low risk; ant scar w/ peri-infarct ischemia; EF 55% with ant AK // Myoview 5/16: EF 55-65, ant, apical scar, no ischemia    Cataract    Chronic back pain    HNP, DDD, spinal stenosis, vertebral compression fractures.    Chronic nausea    takes Zofran daily as needed.  normal gastric emptying study 03/2013   CKD (chronic kidney disease), stage III ([place]) 09/27/2017   Constipation    felt to be functional. takes Senokot and Miralax daily.  treated with Linzess in past.    DIVERTICULOSIS, COLON 08/28/2008   Qualifier: Diagnosis of  By: [name], [name], [name]     Elevated LFTs 2015   probably med induced DILI, from Augmentin vs Pravastatin   GERD (gastroesophageal reflux disease)    hx of esophageal stricture    Hiatal hernia    Paraesophageal hernias. s/p fundoplications in 7253 and 04/2013  History of bronchitis several of yrs ago   History of colon polyps 03/2009, 03/2011   adenomatous colon polyps, no high grade dysplasia.    History of vertigo    no meds   Hyperlipidemia    takes Pravastatin daily   Hypertension    takes Amlodipine,Metoprolol,and Losartan daily   Multiple closed pelvic fractures without disruption of pelvic circle  (HCC)    Nausea 03/2016   Osteoporosis    PONV (postoperative nausea and vomiting)    yrs ago   Pubic ramus fracture (HCC) 12/20/2019   Slow urinary stream    takes Rapaflo daily   TIA (transient ischemic attack) 1995   no residual effects noted   Vertebral compression fracture ([place]) 06/23/2012    Family History  Problem Relation Age of Onset   Colon cancer Mother 75   Heart attack Father 48       heart attack   Brain cancer Brother        tumor    Heart disease Brother    Heart disease Brother    Kidney disease Daughter    Esophageal cancer Neg Hx    Stomach cancer Neg Hx    Past Surgical History:  Procedure Laterality Date   APPENDECTOMY     patient unsure of date   BACK [place]     Patient unsure of date.   CORONARY ANGIOPLASTY  11/16/2001   1 stent   ESOPHAGEAL MANOMETRY N/A 03/25/2013   Procedure: ESOPHAGEAL MANOMETRY (EM);  Surgeon: [name] Castle, MD;  Location: [facility];  Service: Endoscopy;  Laterality: N/A;   ESOPHAGOGASTRODUODENOSCOPY N/A 03/15/2013   Procedure: ESOPHAGOGASTRODUODENOSCOPY (EGD);  Surgeon: Jerene Bears, MD;  Location[facility] ENDOSCOPY;  Service: Endoscopy;  Laterality: N/A;   ESOPHAGOGASTRODUODENOSCOPY N/A 12/25/2015   Procedure: ESOPHAGOGASTRODUODENOSCOPY (EGD);  Surgeon: Doran Stabler, MD;  Location: [facility] ENDOSCOPY;  Service: Endoscopy;  Laterality: N/A;   EYE SURGERY  11/2000   bilateral cataracts with lens implant   FIXATION KYPHOPLASTY LUMBAR SPINE  X 2   "L3-4"   HEMORRHOID SURGERY     HIATAL HERNIA REPAIR  06/03/2011   Procedure: LAPAROSCOPIC REPAIR OF HIATAL HERNIA;  Surgeon: Adin Hector, MD;  Location: [facility];  Service: General;  Laterality: N/A;   HIATAL HERNIA REPAIR N/A 04/24/2013   Procedure: LAPAROSCOPIC  REPAIR RECURRENT PARASOPHAGEAL HIATAL HERNIA WITH FUNDOPLICATION;  Surgeon: Adin Hector, MD;  Location: [facility];  Service: General;  Laterality: N/A;   INSERTION OF MESH N/A  04/24/2013   Procedure: INSERTION OF MESH;  Surgeon: Adin Hector, MD;  Location: [facility];  Service: General;  Laterality: N/A;   IR KYPHO THORACIC WITH BONE BIOPSY  09/29/2017   IR KYPHO THORACIC WITH BONE BIOPSY  11/03/2017   IR KYPHO THORACIC WITH BONE BIOPSY  02/14/2019   IR KYPHO THORACIC WITH BONE BIOPSY  04/02/2019   LAPAROSCOPIC LYSIS OF ADHESIONS N/A 04/24/2013   Procedure: LAPAROSCOPIC LYSIS OF ADHESIONS;  Surgeon: Adin Hector, MD;  Location: [facility];  Service: General;  Laterality: N/A;   LAPAROSCOPIC NISSEN FUNDOPLICATION  4/53/6468   Procedure: LAPAROSCOPIC NISSEN FUNDOPLICATION;  Surgeon: Adin Hector, MD;  Location: [facility];  Service: General;  Laterality: N/A;   LUMBAR FUSION  12/07/2015   Right L2-3 facetectomy, posterolateral fusion L2-3 with pedicle screws, rods, local bone graft, Vivigen cancellous chips. Fusion extended to the L1 level with pedicle screws and rods  LUMBAR LAMINECTOMY/DECOMPRESSION MICRODISCECTOMY Right 06/26/2012   Procedure: LUMBAR LAMINECTOMY/DECOMPRESSION MICRODISCECTOMY;  Surgeon: Jessy Oto, MD;  Location: [place];  Service: Orthopedics;  Laterality: Right;  RIGHT L4-5 MICRODISCECTOMY   LUMBAR LAMINECTOMY/DECOMPRESSION MICRODISCECTOMY N/A 05/29/2015   Procedure: RIGHT L2-3 MICRODISCECTOMY;  Surgeon: Jessy Oto, MD;  Location: [place];  Service: Orthopedics;  Laterality: N/A;   TOTAL ABDOMINAL HYSTERECTOMY  1975   partial   UPPER GASTROINTESTINAL ENDOSCOPY     Social History   Social History Narrative   Lives with husband.   Right-handed.   3 cups caffeine per day.   Immunization History  Administered Date(s) Administered   DT (Pediatric) 05/06/2014   Influenza Split 02/07/2011   Influenza Whole 02/09/2009, 01/19/2010, 02/13/2012   Influenza, High Dose Seasonal PF 03/22/2013, 01/15/2014, 02/27/2015, 01/14/2016, 01/18/2017, 02/14/2018, 02/14/2019, 03/16/2020   PFIZER(Purple Top)SARS-COV-2 Vaccination 05/25/2019, 06/15/2019, 03/24/2020    Pneumococcal Conjugate-13 05/06/2014   Pneumococcal Polysaccharide-23 02/09/2011   Pneumococcal-Unspecified 02/06/2010, 02/09/2012   Td 05/10/2003     Objective: Vital Signs: There were no vitals taken for this visit.   Physical Exam   Musculoskeletal Exam: ***  CDAI Exam: CDAI Score: -- Patient Global: --; Provider Global: -- Swollen: --; Tender: -- Joint Exam 03/18/2021   No joint exam has been documented for this visit   There is currently no information documented on the homunculus. Go to the Rheumatology activity and complete the homunculus joint exam.  Investigation: No additional findings.  Imaging: No results found.  Recent Labs: Lab Results  Component Value Date   WBC 9.1 02/18/2021   HGB 15.1 02/18/2021   PLT 365 02/18/2021   NA 140 02/18/2021   K 4.1 02/18/2021   CL 102 02/18/2021   CO2 27 02/18/2021   GLUCOSE 97 02/18/2021   BUN 12 02/18/2021   CREATININE 0.86 02/18/2021   BILITOT 0.7 02/18/2021   ALKPHOS 185 (H) 01/29/2020   AST 15 02/18/2021   ALT 13 02/18/2021   PROT 7.3 02/18/2021   ALBUMIN 3.9 01/29/2020   CALCIUM 9.7 02/18/2021   GFRAA 71 10/15/2020    Speciality Comments: No specialty comments available.  Procedures:  No procedures performed Allergies: Ace inhibitors, Aspartame, Atorvastatin, Biaxin [clarithromycin], Daliresp [roflumilast], Erythromycin, and Levofloxacin   Assessment / Plan:     Visit Diagnoses: No diagnosis found.  Orders: No orders of the defined types were placed in this encounter.  No orders of the defined types were placed in this encounter.   Face-to-face time spent with patient was *** minutes. Greater than 50% of time was spent in counseling and coordination of care.  Follow-Up Instructions: No follow-ups on file.   Earnestine Mealing, CMA  Note - This record has been created using Editor, commissioning.  Chart creation errors have been sought, but may not always  have been located. Such creation errors do  not reflect on  the standard of medical care.

## 2021-03-05 LAB — PROTEIN ELECTROPHORESIS, SERUM
Albumin ELP: 4.2 g/dL (ref 3.8–4.8)
Alpha 1: 0.3 g/dL (ref 0.2–0.3)
Alpha 2: 0.8 g/dL (ref 0.5–0.9)
Beta 2: 0.4 g/dL (ref 0.2–0.5)
Beta Globulin: 0.4 g/dL (ref 0.4–0.6)
Gamma Globulin: 1.1 g/dL (ref 0.8–1.7)
Total Protein: 7.2 g/dL (ref 6.1–8.1)

## 2021-03-09 ENCOUNTER — Ambulatory Visit: Payer: PPO | Admitting: Pharmacist

## 2021-03-09 DIAGNOSIS — I10 Essential (primary) hypertension: Secondary | ICD-10-CM

## 2021-03-09 DIAGNOSIS — M8000XS Age-related osteoporosis with current pathological fracture, unspecified site, sequela: Secondary | ICD-10-CM

## 2021-03-09 DIAGNOSIS — M51369 Other intervertebral disc degeneration, lumbar region without mention of lumbar back pain or lower extremity pain: Secondary | ICD-10-CM

## 2021-03-09 DIAGNOSIS — M5136 Other intervertebral disc degeneration, lumbar region: Secondary | ICD-10-CM

## 2021-03-09 DIAGNOSIS — E559 Vitamin D deficiency, unspecified: Secondary | ICD-10-CM

## 2021-03-09 DIAGNOSIS — E782 Mixed hyperlipidemia: Secondary | ICD-10-CM

## 2021-03-09 DIAGNOSIS — G894 Chronic pain syndrome: Secondary | ICD-10-CM

## 2021-03-09 DIAGNOSIS — N182 Chronic kidney disease, stage 2 (mild): Secondary | ICD-10-CM

## 2021-03-09 DIAGNOSIS — F3341 Major depressive disorder, recurrent, in partial remission: Secondary | ICD-10-CM

## 2021-03-09 DIAGNOSIS — S22080S Wedge compression fracture of T11-T12 vertebra, sequela: Secondary | ICD-10-CM

## 2021-03-10 NOTE — Progress Notes (Signed)
Patient Visit with Chart Prep  [name] [name],[name] [name] [id number] 44 years, Female  DOB: 03-24-1936  M: [phone] Care Team:  Kathleen Argue, Pollyann Samples Rattiff __________________________________________________ Summary: Pt is a sweet 85 year old female. During visit pt complains of having back pain and will follow-up with pain management to see if she could start and additional med for pain.  Recommendations/Changes made from today's visit: Recommend restarting Duloxetine for mood and pain (PHQ-9: 5)  Plan: Start Duloxetine 30mg  QD for mood and pain Follow up with pain-management/ortho for back pain  Patient scheduled for CCM visit with the clinical pharmacist.  Patient is referred for CCM by their PCP and CPP is under general PCP supervision.: At least 2 of these conditions are expected to last 12 months or longer and patient is at significant risk for acute exacerbations and/or functional decline.  Patient has consented to participation in [place] [name] program. Visit Type: Phone Call Date of Upcoming Visit: 03/10/2021 Patient Chart Prep [facility]) Patient's Chronic Conditions: Hypertension (HTN), Other, Asthma, Gastroesophageal Reflux Disease (GERD), Osteopenia or Osteoporosis, Chronic Kidney Disease (CKD), Depression, Hyperlipidemia/Dyslipidemia (HLD), Chronic Pain List Other Conditions (separated by comma): iron deficiency, spinal stenosis, vitamin d deficiency, degenerative disc disease, coronary artery disease  Were there PCP Visits in last 6 months?: Yes Visit #1: 02/18/21 Dr. Melford Aase: follow up visit. STOP benxonatate, dexamethazone and promethazine. no medication changes. BP: 114/70 HR: 81 Visit #2: 12/24/20 Marta Lamas NP: COVID. START doxycycline 100 MG capsule; Take 1 capsule  by mouth 2 (two) times daily for 10 days. START benzonatate ) 100 MG capsule; Take 1 capsule (100 mg total) by mouth 3 (three) times daily as needed for cough. START promethazine-dextromethorphan 6.25-15 MG/5ML  syrup; Take 5 mLs by mouth 4 (four) times daily as needed for cough. START dexamethasone 1 MG tablet; Take 3 tabs for 3 days, 2 tabs for 3 days 1 tab for 5 days. BP: 161/79 HR: 69 Were there Specialist Visits in last 6 months?: Yes Visit #1: 03/03/21 Dr. Louanne Skye (Orthopedic Surgery): back pain follow up. no mediation changes. follow up in 3 weeks. BP: 132/71 HR: 78 Visit #2: 01/22/21 Dr. Louanne Skye )Orthopedic Surgery): back pain follow up. no medication changes. no follow up needed. BP: 138/76 HR: 73 Visit #3: 01/04/21 Dr. Havery Moros (Gastroenterology): colonoscopy. no medication changes. BP: 132/80 HR: 61 Visit #4: 12/18/20 Nitka (Orthopedic Surgery): right leg pain. no medication changes. follow up in 4 weeks. BP: 152/82 HR: 57 Was there a Hospital Visit in last 30 days?: No Were there other Hospital Visits in last 6 months?: No Medication Information Are there any Medication discrepancies?: No Are there any Medication adherence gaps (beyond 5 days past due)?: No Medication adherence rates for the STAR rating drugs: none List Patient's current Care Gaps: No current Care Gaps identified  Pre-Call Questions [facility]) Are you able to connect with Patient: Yes Visit Type: Phone Call May we confirm what is the best phone # for the pharmacist to call you?: [phone] Confirm visit date/time: 03/11/2021 9:30am Have you seen any other providers since your last visit?: No Any changes in your medicines or health?: No Any side effects from any medications?: No Do you have any symptoms or problems not managed by your medications?: No What are your top 2 health concerns right now?: no concerns What are your top 2 medication concerns right now?: Other Details: no concerns about medications Has your Dr asked that you take BP, BG or special diet at home?: Other Details:  none Do you get any type of exercise on a regular basis?: No What are your top 1 or 2 goals for your health?: none What are you doing to try to  reach these goals?: Taking medications What, if any, problems do you have getting your medications from the pharmacy?: None Is there anything else that you would like to discuss during the appointment?: No Disease Assessments  Subjective Information Current BP: 132/71 Current HR: 78 taken on: 03/02/2021 Previous BP: 114/70 Previous HR: 81 taken on: 02/17/2021 Weight: 149lb BMI: 26.39 Last GFR: 67 taken on: 02/17/2021 Why did the patient present?: CCM initial visit Marital status?: Married Details: Married for 27 years, celebrated in anniversary in June. They were high school sweethearts. 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 Varies day to day. She stays somewhat busy. After having back surgery, she is not able to move around as much. Some days its good other days it bad. But when she lays down it makes it better. She reads her Bible every night Who does the patient spend their time with and what do they do?: Spends most time with husband, they go to their appointments together. She considers him a great support system Lifestyle habits such as diet and exercise?: She tries to stay active. She does physical therapy exercises at home. and she and husband walks to [employer] (5 minute walk) every day to shop/pick up medications.  Diet: no restrictions Alcohol, tobacco, and illicit drug usage?: None What is the patient's sleep pattern?: Trouble falling asleep, Other (with free form text) Details: Has problem with legs due to restless leg syndrome. Usually pt wakes up in the middle of the night or has difficulty falling asleep 1 time a week due to legs. Otherwise pt is fine How many hours per night does patient typically sleep?: Sleeps around 11 pm to 8:30 am. Gets about 8-9 hours of sleep per day Patient pleased with health care they are receiving?: Yes Family, occupational, and living circumstances relevant to  overall health?: Has a daughter (51 years old) and a son (41 years old). Son lives in [place] and daughter lives across the road in her grandmother's old home (the pt's mother) Her mother is deceased.. She died in 30-Jul-1986 Name and location of Current pharmacy: [facility] [address] [place] Current Rx insurance plan: HTA Are meds synced by current pharmacy?: No Are meds delivered by current pharmacy?: No - delivery not available Would patient benefit from direct intervention of clinical lead in dispensing process to optimize clinical outcomes?: Yes Are [facility] pharmacy services available where patient lives?: Yes Is patient disadvantaged to use [facility]?: No [facility] services reviewed with patient and patient wishes to change pharmacy?: No Select reason patient declined to change pharmacies: Prefers to go to the pharmacy / reason to leave the house Does patient experience delays in picking up medications due to transportation concerns (getting to pharmacy)?: No Any additional demeanor/mood notes?: Pt is a sweet 85 year old female. During visit pt complains of having very rough back pain and will follow-up with pain management to see if she could start and additional med for pain.  Hypertension (HTN) Assess this condition today?: Yes Is patient able to obtain BP reading today?: No Goal: <130/80 mmHG Hypertension Stage: Pre-HTN (SBP: 120-129 and DBP < 80) Patient has tried and failed: ACEi - cough Is Patient checking BP at home?: Yes Pt.  home BP readings are ranging: Pt does not remember. Husband helps her check her BP. Checks 1-2 times per month.  How often does patient miss taking their blood pressure medications?: No Has patient experienced hypotension, dizziness, falls or bradycardia?: No Check present secondary causes (below) for HTN: CKD BP RPM device: Does patient qualify?: Yes We discussed: Targeting 150 minutes of aerobic activity per  week, Reducing the amount of salt intake to 1500mg /per day., Proper Home BP Measurement Assessment:: Controlled Drug: Amlodipine 10mg  QD Assessment: Query need Drug: Metoprolol tartrate 50mg  BID Assessment: Appropriate, Effective, Safe, Accessible Plan to Counsel: Check blood pressure 1 to 2 times per week and keep a blood pressure log HC Follow up: N/A Pharmacist Follow up: Follow up with BP  Hyperlipidemia/Dyslipidemia (HLD) Last Lipid panel on: 02/17/2021 TC (Goal<200): 146 LDL: 61 HDL (Goal>40): 67 TG (Goal<150): 96 ASCVD 10-year risk?is:: N/A due to Age > 79 Assess this condition today?: Yes LDL Goal: <70 Has patient tried and failed any HLD Medications?: Yes Medications failed: atorvastatin atorvastatin: muscle aches Check present secondary causes (below) that can lead to increased cholesterol levels (multi-choice optional): Beta blockers, CKD Assessment:: Controlled Drug: Lifestyle modifications only Assessment: Appropriate, Effective, Safe, Accessible HC Follow up: N/A Pharmacist Follow up: Reassess lipids at next visit  Depression Previous PHQ-9 Score: 0 Assess this condition today?: Yes Completing the PHQ-9 Questionnaire today?: Yes PHQ-9: Over the last 2 weeks, how often have you been bothered by the following problems?: Done Little interest or pleasure in doing things: 0 (Not at all) Feeling down, depressed, or hopeless: 0 (Not at all) Trouble falling or staying asleep, or sleeping too much: 0 (Not at all) Feeling tired or having little energy: 1 (several days) Poor appetite or overeating: 3 (Nearly every day) Feeling bad about yourself  or that you are a failure or have let yourself or your family down: 0 (Not at all) Trouble concentrating on things, such as reading the newspaper or watching television: 0 (Not at all) Moving or speaking so slowly that other people could have noticed? Or the opposite - being so fidgety or restless that you have been moving around  a to more than usual: 0 (Not at all) Thoughts that you would be better off dead or of hurting yourself in some way: 1 (several days) Total PHQ-9 Score (please total responses for questions  above): 5 Depression Severity: Mild Depression (Score: 5-9) In your opinion, how do you feel your depression symptoms have been controlled over the past 3 months?: Improved Patient has tried and failed: Tried duloxetine, discontinued in August. Pt would like to restart medication Assessment:: Controlled Drug: none Assessment: Query need Plan to Order: BMP Plan to Review: BMP (Na+) Plan to (other): Recommend CBT and reach out to provider on restarting Duloxetine, may also help with pain HC Follow up: Depression assessment February Pharmacist Follow up: Assess Duloxetine efficacy in symptom management  Asthma Pre-bronchodilator FEV1: 1.23 Post-bronchodilator FEV1: 1.38 taken on: 09/01/2020 Current Eosinophils: 701 taken on: 02/17/2021 Assess this condition today?: No  Chronic kidney disease (CKD) Previous GFR: 67 taken on: 02/17/2021 The current microalbumin ratio is: 0.9 tested on: 10/14/2020 Assess this condition today?: Yes CKD Stage: Stage 2 (GFR 61-90 ml/min) Albuminuria Stage: A1 (<30) Contributing factors for developing CKD: HTN Is Patient taking statin medication: No Reason patient is not taking statin(s): age Is patient taking ACEi / ARB?: No Reason patient is not taking ACEi / ARB: side effect cough Renal dose adjustments recommended?: No We discussed: Limiting  dietary sodium intake to less than 2000 mg / day, Maintaining blood pressure control, Avoidance of nephrotoxic drugs (NSAIDs) Assessment:: Controlled Drug: none Assessment: Query need Plan to Order: BMP (GFR and SCr) Plan to Review: BMP (GFR and SCr) HC Follow up: N/A Pharmacist Follow up: Will review pt kidney function/ electrolytes at next visit  Osteopenia or Osteoporosis Current T-score: DualFemur Neck Left  -2.7  DualFemur Total Mean -2.1  Left Forearm Radius -1.5 taken on: 11/11/2018 Previous T-score: DualFemur Neck Left  -2.8 DualFemur Total Mean -2.3  Left Forearm Radius -1.8 taken on: 07/08/2016 Current Vitamin D 25-OH: 94 taken on: 02/17/2021 Previous Vitamin D 25-OH: 97 taken on: 10/14/2020 Assess this condition today?: Yes Patient has: 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 lost balance and tripped.  8/21 fractured tail bone and admitted in hospital for 2 weeks How many times have you fallen?: 2 Are there any stairs in or around the home?: No Is the home free of loose throw rugs in walkways, pet beds, electrical cords, etc.?: Yes Is there adequate lighting in your home to reduce the risk of falls?: Yes Dietary calcium intake: Pt takes Calcium supplements 600mg  BID We discussed: Weight bearing exercises (walking, light weights, resistance training), Fall prevention Assessment:: Uncontrolled Drug: Prolia 60mg  q23month Assessment: Appropriate, Query Effectiveness Additional Info: Dexa scan scheduled for 12/22 [facility] Follow up: Fall assessment (after 12/9) Pharmacist Follow up: Review DEXA scan  Chronic Pain Assess this condition today?: Yes Where is the pain primarily located?: back 2/2 back surgery How would you rate your pain without medications (scale of 1-10)?: 7 How would you rate your pain with medications (scale of 1-10)?: 6 What time of day is your pain at it's worst?: Morning Any issues with constipation related to your medications?: Yes What is the patient's current bowel regimen?: [employer] brand Senna/Docusate Assessment:: Uncontrolled Drug: Gabapentin 400mg  3 times a day Assessment: Appropriate, Query Effectiveness Drug: Voltaren gel as needed Assessment: Appropriate, Query Effectiveness HC Follow up: N/A Pharmacist Follow up: Pt will follow up with pain management (appt  in December)  Exercise, Diet and Non-Drug Coordination Needs Additional exercise counseling points. We discussed: incorporating flexibility, balance, and strength training exercises Additional diet counseling points. We discussed: aiming to consume at least 8 cups of water day Discussed Non-Drug Care Coordination Needs: Yes Does Patient have Medication financial barriers?: No  Engagement Notes [name] on 03/17/2021 03:34 PM [facility] Follow-up: -December (after 12/9) Fall assessment -February - Depression assessment  CPP Follow-up: -05/2021 OV w/Dana -07/2021 OV w/Ashley -08/2021 CCM follow-up visit phone, review DEXA, depression symptoms, BP, pain managment -10/2021 OV w/Ashley   Care gaps: Recommended pt take flu shot, Shingles shot and Covid Booster. Pt is not interested in having colonoscopy at this time. [name] on 03/11/2021 08:02 AM CPP Chart Review: 30 min  CPP Office Visit: 60 min  CPP Office Visit Documentation: 84 min  CPP Coordination of Care:  20 min [facility] Care Plan Completion: 10 min CPP Care Plan Review: 7 min  CARE PLAN  Patient Name: SARAYU PREVOST DOB:??[birth year]  Last Care Plan Update:?03/10/2021  COMPREHENSIVE CARE PLAN AND GOALS?   HYPERTENSION?   MOST RECENT BLOOD PRESSURE:?132/71 03/02/21  MY GOAL BLOOD PRESSURE:?<130/80  CURRENT MEDICATION AND DOSING:?   Amlodipine 10mg  every day  Metoprolol tartrate 50mg  2 times a day   THE GOALS WE HAVE CHOSEN ARE:??  Check your blood  pressure 1-2 times a week and record it in your log. If consistently greater than 130/80 call pharmacy team or provider.  Reducing the amount of salt intake to 1500mg /per day., Proper Home BP Measurement  Maintain an at goal blood pressure?   PLAN TO WORK ON THESE GOALS:?   Take medications regularly??  Check BP at home?every day and keep a log. Bring to your doctor and pharmacist appointments  Reduce salt intake (< 1500mg / day)?  Diet: DASH diet (Choose fruits, vegetables, and  low-fat dairy products. Increase whole grains, fish, poultry, nuts. Reduce red meats and sugars)?  Weight: 1 kg = ~1 mmHg reduction?  Exercise?    CHOLESTEROL?   MOST RECENT LABS:????02/17/2021  TOTAL CHOLESTEROL:?146  TRIGLYCERIDES:?96  HDL:?67  LDL:?61  CURRENT MEDICATION AND DOSING:?  Lifestyle modifications  THE GOALS WE HAVE CHOSEN ARE:??   Total Cholesterol goal under 200, Triglycerides goal under 150, HDL goal above 40, LDL goal under 100  BARRIERS TO ACHIEVING GOALS:?   None, controlled   PLAN TO WORK ON THESE GOALS:?   Take medication regularly?  Diet: high in plant sterols (fruits/ vegetables/ nuts/ whole grains/ legumes). Increase fiber intake (10-25g/day). Avoid foods high in cholesterol (red meat, egg yolks, dairy, oils/ butter). Choose low-fat options.?  Exercise?     ? Depression?   CURRENT REGIMEN AND DOSING:?  None  THE GOALS WE HAVE CHOSEN ARE:?   Lower and manage symptoms of depression?   PLAN TO WORK ON THESE GOALS:???   -Will reach out to provider to restart Duloxetine for mood and pain -Inform practitioner of any signs and symptoms of worsening depression?  Symptoms: persistent feeling of hopelessness, dejection, constant worry, poor concentration, lack of energy, inability to sleep, sometimes suicidal tendencies, agitation, decreased concentration, sleep changes, diminished interest/ pleasure, changes in appetite?    Osteopenia/Osteoporosis  CURRENT REGIMEN AND DOSING:   Prolia 60mg  every 6 months   THE GOALS WE HAVE CHOSEN ARE:   -Reduce fall risk  -Strengthen muscles and bones   PLAN TO WORK ON THESE GOALS:     -Assess home for fall risks, use handrails on stairs and safety bars in bathroom, non-skid mats for the bath tub/ shower  -Exercise: regular weight bearing exercises (walking, jogging) and muscle strengthening (weight training, yoga)     Chronic Pain   CURRENT REGIMEN AND DOSING:  Gabapentin 400mg  3 times a day  Voltaren gel as  needed   THE GOALS WE HAVE CHOSEN ARE:  Reduction of pain intensity Return to normal daily activities  PLAN TO WORK ON THESE GOALS:   Will reach out to providers to increase  Take medications as prescribed Follow up with pain specialist for further treatment of back       [place]?   Your current medication list has been updated. To view, log in to your patient portal.?   Call if any changes need to be made.?    ?  MEDICATION REVIEW?  MEDICATION REVIEW CONDUCTED:?? Yes?  DATE:??? 03/10/2021 BEST POSSIBLE MEDICATION HISTORY?  SOURCE:?? Medical Records?   ?  HOW DO I? - WHEN DO I??   GET AHOLD OF MY DOCTOR DURING BUSINESS HOURS WHEN THE OFFICE IS OPEN?  PHONE NUMBER: [phone]?    AFTER HOURS [facility] NURSE WHEN THE OFFICE IS CLOSED?? PHONE: [phone]?   TALK TO MY CARE COORDINATOR??  NAME: [name]  PHONE: [phone]   EMAIL:??   [email or web address]?   CARE COORDINATOR STAFF?  Rosary Lively, [place], [place]  Trader, Hildred Alamin?  Contact Phone Number: [phone]?      NOTE SECTION  Thank you for participating in the Chronic Care Management (CCM) program with Dr. Melford Aase    This program takes a proactive approach to your health and my team will serve as a resource for you throughout the year. Please follow up at [phone] if you have any questions or experience changes to your overall health. Your next CCM appointment will be conducted with [name], PharmD as follows:    Date 08/17/2020  Time  9:00 am  Over the Phone        Rachelle Hora. Jeannett Senior, PharmD   Clinical Pharmacist   [email or web address]   [phone]

## 2021-03-11 ENCOUNTER — Other Ambulatory Visit: Payer: Self-pay

## 2021-03-17 ENCOUNTER — Other Ambulatory Visit: Payer: Self-pay | Admitting: Adult Health

## 2021-03-17 ENCOUNTER — Other Ambulatory Visit: Payer: Self-pay

## 2021-03-17 MED ORDER — DULOXETINE HCL 30 MG PO CPEP: ORAL_CAPSULE | ORAL | 3 refills | Status: DC

## 2021-03-18 ENCOUNTER — Telehealth: Payer: Self-pay | Admitting: Rheumatology

## 2021-03-18 ENCOUNTER — Ambulatory Visit: Payer: PPO | Admitting: Rheumatology

## 2021-03-18 DIAGNOSIS — E559 Vitamin D deficiency, unspecified: Secondary | ICD-10-CM

## 2021-03-18 DIAGNOSIS — Z5181 Encounter for therapeutic drug level monitoring: Secondary | ICD-10-CM

## 2021-03-18 DIAGNOSIS — Z8709 Personal history of other diseases of the respiratory system: Secondary | ICD-10-CM

## 2021-03-18 DIAGNOSIS — Z8659 Personal history of other mental and behavioral disorders: Secondary | ICD-10-CM

## 2021-03-18 DIAGNOSIS — M810 Age-related osteoporosis without current pathological fracture: Secondary | ICD-10-CM

## 2021-03-18 DIAGNOSIS — M47819 Spondylosis without myelopathy or radiculopathy, site unspecified: Secondary | ICD-10-CM

## 2021-03-18 DIAGNOSIS — M170 Bilateral primary osteoarthritis of knee: Secondary | ICD-10-CM

## 2021-03-18 DIAGNOSIS — Z8639 Personal history of other endocrine, nutritional and metabolic disease: Secondary | ICD-10-CM

## 2021-03-18 DIAGNOSIS — Z8669 Personal history of other diseases of the nervous system and sense organs: Secondary | ICD-10-CM

## 2021-03-18 DIAGNOSIS — Z8719 Personal history of other diseases of the digestive system: Secondary | ICD-10-CM

## 2021-03-18 DIAGNOSIS — M19041 Primary osteoarthritis, right hand: Secondary | ICD-10-CM

## 2021-03-18 DIAGNOSIS — Z8781 Personal history of (healed) traumatic fracture: Secondary | ICD-10-CM

## 2021-03-18 DIAGNOSIS — Z9049 Acquired absence of other specified parts of digestive tract: Secondary | ICD-10-CM

## 2021-03-18 DIAGNOSIS — S3282XS Multiple fractures of pelvis without disruption of pelvic ring, sequela: Secondary | ICD-10-CM

## 2021-03-18 DIAGNOSIS — M5136 Other intervertebral disc degeneration, lumbar region: Secondary | ICD-10-CM

## 2021-03-18 DIAGNOSIS — Z79899 Other long term (current) drug therapy: Secondary | ICD-10-CM

## 2021-03-18 DIAGNOSIS — Z8679 Personal history of other diseases of the circulatory system: Secondary | ICD-10-CM

## 2021-03-18 DIAGNOSIS — Z9889 Other specified postprocedural states: Secondary | ICD-10-CM

## 2021-03-18 NOTE — Telephone Encounter (Signed)
Patient is sick, and had to reschedule her follow up appointment scheduled for today. Patient had labs drawn at [facility] last Thursday 03/11/2021, and would like to know if those labs could be used for what we need as well? Please call patient to advise.

## 2021-03-18 NOTE — Telephone Encounter (Signed)
Per [facility], patient will need labs. Can get at her appointment on 03/31/2021.  Spoke with patient and advised.

## 2021-03-30 NOTE — Progress Notes (Deleted)
Office Visit Note  Patient: [name] [name]             Date of Birth: 05-17-1935           MRN: [id number]             PCP: [name], MD Referring: [name], MD Visit Date: 03/31/2021 Occupation: @GUAROCC @  Subjective:    History of Present Illness: [name] [name] is a 85 y.o. female with history of osteoporosis and osteoarthritis.   DEXA scheduled on 04/16/21  Prolia administered on 10/12/20 Taking vitamin D daily  Followed by Dr. Louanne Skye  Activities of Daily Living:  Patient reports morning stiffness for *** {minute/hour:19697}.   Patient {ACTIONS;DENIES/REPORTS:21021675::"Denies"} nocturnal pain.  Difficulty dressing/grooming: {ACTIONS;DENIES/REPORTS:21021675::"Denies"} Difficulty climbing stairs: {ACTIONS;DENIES/REPORTS:21021675::"Denies"} Difficulty getting out of chair: {ACTIONS;DENIES/REPORTS:21021675::"Denies"} Difficulty using hands for taps, buttons, cutlery, and/or writing: {ACTIONS;DENIES/REPORTS:21021675::"Denies"}  No Rheumatology ROS completed.   PMFS History:  Patient Active Problem List   Diagnosis Date Noted   Iron deficiency 10/16/2020   History of adenomatous polyp of colon 07/21/2020   Atypical chest pain 07/08/2020   History of fundoplication    Epigastric pain 07/09/2019   Senile purpura ([place]) 07/05/2019   Abnormal glucose (prediabetes) 03/01/2018   FHx: heart disease 03/01/2018   CKD (chronic kidney disease) stage 2, GFR 60-89 ml/min 09/27/2017   T12 compression fracture ([place]) 09/26/2017   Chronic bronchitis ([place]) 08/09/2017   Atherosclerosis of aorta (HCC) by Lumbar CYT scan 01/29/2021 04/05/2017   Odynophagia    Spinal stenosis, lumbar region, with neurogenic claudication 05/29/2015   DDD (degenerative disc disease), lumbar 04/21/2015   Hyperlipidemia, mixed 11/24/2014   Gastroesophageal reflux disease 11/24/2014   Depression, major, in partial remission ([place]) 09/26/2014   Chronic pain syndrome 09/26/2014   Coronary artery  disease involving native heart without angina pectoris    Osteoporosis 02/10/2014   Vitamin D deficiency 08/14/2013   MGUS (monoclonal gammopathy of unknown significance) 03/05/2013   ESOPHAGEAL STRICTURE 08/28/2008   Mild intermittent asthma without complication 41/93/7902   Incarcerated recurrent paraesophageal hiatal hernia s/p lap redo repair [id number] 01/31/2008   Essential hypertension 07/25/2007    Past Medical History:  Diagnosis Date   Anxiety    takes Xanax daily as needed   Asthma    Albuterol daily as needed   Blood transfusion without reported diagnosis    CAD (coronary artery disease)    LHC 2003 with 40% pLAD, 30% mLAD, 100% D1 (moderate vessel), 100%  D2 (small vessel), 95% mid small OM2.  Pt had PTCA to D1;  D2 and OM2 small vessels and tx medically;  Last Myoview (2012): anterior infarct seen with scar, no ischemia. EF normal. Med Rx // Myoview (12/14):  Low risk; ant scar w/ peri-infarct ischemia; EF 55% with ant AK // Myoview 5/16: EF 55-65, ant, apical scar, no ischemia    Cataract    Chronic back pain    HNP, DDD, spinal stenosis, vertebral compression fractures.    Chronic nausea    takes Zofran daily as needed.  normal gastric emptying study 03/2013   CKD (chronic kidney disease), stage III ([place]) 09/27/2017   Constipation    felt to be functional. takes Senokot and Miralax daily.  treated with Linzess in past.    DIVERTICULOSIS, COLON 08/28/2008   Qualifier: Diagnosis of  By: [name], [name], [name]     Elevated LFTs 2015   probably med induced DILI, from Augmentin vs Pravastatin   GERD (gastroesophageal reflux disease)    hx  of esophageal stricture    Hiatal hernia    Paraesophageal hernias. s/p fundoplications in 9470 and 04/2013   History of bronchitis several of yrs ago   History of colon polyps 03/2009, 03/2011   adenomatous colon polyps, no high grade dysplasia.    History of vertigo    no meds   Hyperlipidemia    takes Pravastatin daily   Hypertension     takes Amlodipine,Metoprolol,and Losartan daily   Multiple closed pelvic fractures without disruption of pelvic circle (HCC)    Nausea 03/2016   Osteoporosis    PONV (postoperative nausea and vomiting)    yrs ago   Pubic ramus fracture (HCC) 12/20/2019   Slow urinary stream    takes Rapaflo daily   TIA (transient ischemic attack) 1995   no residual effects noted   Vertebral compression fracture ([place]) 06/23/2012    Family History  Problem Relation Age of Onset   Colon cancer Mother 32   Heart attack Father 47       heart attack   Brain cancer Brother        tumor    Heart disease Brother    Heart disease Brother    Kidney disease Daughter    Esophageal cancer Neg Hx    Stomach cancer Neg Hx    Past Surgical History:  Procedure Laterality Date   APPENDECTOMY     patient unsure of date   BACK [place]     Patient unsure of date.   CORONARY ANGIOPLASTY  11/16/2001   1 stent   ESOPHAGEAL MANOMETRY N/A 03/25/2013   Procedure: ESOPHAGEAL MANOMETRY (EM);  Surgeon: Inda Castle, MD;  Location: [facility];  Service: Endoscopy;  Laterality: N/A;   ESOPHAGOGASTRODUODENOSCOPY N/A 03/15/2013   Procedure: ESOPHAGOGASTRODUODENOSCOPY (EGD);  Surgeon: Jerene Bears, MD;  Location: [facility] ENDOSCOPY;  Service: Endoscopy;  Laterality: N/A;   ESOPHAGOGASTRODUODENOSCOPY N/A 12/25/2015   Procedure: ESOPHAGOGASTRODUODENOSCOPY (EGD);  Surgeon: Doran Stabler, MD;  Location: [facility] ENDOSCOPY;  Service: Endoscopy;  Laterality: N/A;   EYE SURGERY  11/2000   bilateral cataracts with lens implant   FIXATION KYPHOPLASTY LUMBAR SPINE  X 2   "L3-4"   HEMORRHOID SURGERY     HIATAL HERNIA REPAIR  06/03/2011   Procedure: LAPAROSCOPIC REPAIR OF HIATAL HERNIA;  Surgeon: Adin Hector, MD;  Location: [facility] ORS;  Service: General;  Laterality: N/A;   HIATAL HERNIA REPAIR N/A 04/24/2013   Procedure: LAPAROSCOPIC  REPAIR RECURRENT PARASOPHAGEAL HIATAL HERNIA WITH  FUNDOPLICATION;  Surgeon: Adin Hector, MD;  Location: [facility];  Service: General;  Laterality: N/A;   INSERTION OF MESH N/A 04/24/2013   Procedure: INSERTION OF MESH;  Surgeon: Adin Hector, MD;  Location: [facility];  Service: General;  Laterality: N/A;   IR KYPHO THORACIC WITH BONE BIOPSY  09/29/2017   IR KYPHO THORACIC WITH BONE BIOPSY  11/03/2017   IR KYPHO THORACIC WITH BONE BIOPSY  02/14/2019   IR KYPHO THORACIC WITH BONE BIOPSY  04/02/2019   LAPAROSCOPIC LYSIS OF ADHESIONS N/A 04/24/2013   Procedure: LAPAROSCOPIC LYSIS OF ADHESIONS;  Surgeon: Adin Hector, MD;  Location: [facility];  Service: General;  Laterality: N/A;   LAPAROSCOPIC NISSEN FUNDOPLICATION  9/62/8366   Procedure: LAPAROSCOPIC NISSEN FUNDOPLICATION;  Surgeon: Adin Hector, MD;  Location: [facility];  Service: General;  Laterality: N/A;   LUMBAR FUSION  12/07/2015   Right L2-3 facetectomy, posterolateral fusion L2-3 with  pedicle screws, rods, local bone graft, Vivigen cancellous chips. Fusion extended to the L1 level with pedicle screws and rods   LUMBAR LAMINECTOMY/DECOMPRESSION MICRODISCECTOMY Right 06/26/2012   Procedure: LUMBAR LAMINECTOMY/DECOMPRESSION MICRODISCECTOMY;  Surgeon: Jessy Oto, MD;  Location: [facility];  Service: Orthopedics;  Laterality: Right;  RIGHT L4-5 MICRODISCECTOMY   LUMBAR LAMINECTOMY/DECOMPRESSION MICRODISCECTOMY N/A 05/29/2015   Procedure: RIGHT L2-3 MICRODISCECTOMY;  Surgeon: Jessy Oto, MD;  Location: [facility];  Service: Orthopedics;  Laterality: N/A;   TOTAL ABDOMINAL HYSTERECTOMY  1975   partial   UPPER GASTROINTESTINAL ENDOSCOPY     Social History   Social History Narrative   Lives with husband.   Right-handed.   3 cups caffeine per day.   Immunization History  Administered Date(s) Administered   DT (Pediatric) 05/06/2014   Influenza Split 02/07/2011   Influenza Whole 02/09/2009, 01/19/2010, 02/13/2012   Influenza, High Dose Seasonal PF 03/22/2013, 01/15/2014, 02/27/2015, 01/14/2016,  01/18/2017, 02/14/2018, 02/14/2019, 03/16/2020   PFIZER(Purple Top)SARS-COV-2 Vaccination 05/25/2019, 06/15/2019, 03/24/2020   Pneumococcal Conjugate-13 05/06/2014   Pneumococcal Polysaccharide-23 02/09/2011   Pneumococcal-Unspecified 02/06/2010, 02/09/2012   Td 05/10/2003     Objective: Vital Signs: There were no vitals taken for this visit.   Physical Exam Vitals and nursing note reviewed.  Constitutional:      Appearance: She is well-developed.  HENT:     Head: Normocephalic and atraumatic.  Eyes:     Conjunctiva/sclera: Conjunctivae normal.  Pulmonary:     Effort: Pulmonary effort is normal.  Abdominal:     Palpations: Abdomen is soft.  Musculoskeletal:     Cervical back: Normal range of motion.  Skin:    General: Skin is warm and dry.     Capillary Refill: Capillary refill takes less than 2 seconds.  Neurological:     Mental Status: She is alert and oriented to person, place, and time.  Psychiatric:        Behavior: Behavior normal.     Musculoskeletal Exam: ***  CDAI Exam: CDAI Score: -- Patient Global: --; Provider Global: -- Swollen: --; Tender: -- Joint Exam 03/31/2021   No joint exam has been documented for this visit   There is currently no information documented on the homunculus. Go to the Rheumatology activity and complete the homunculus joint exam.  Investigation: No additional findings.  Imaging: No results found.  Recent Labs: Lab Results  Component Value Date   WBC 9.1 02/18/2021   HGB 15.1 02/18/2021   PLT 365 02/18/2021   NA 140 02/18/2021   K 4.1 02/18/2021   CL 102 02/18/2021   CO2 27 02/18/2021   GLUCOSE 97 02/18/2021   BUN 12 02/18/2021   CREATININE 0.86 02/18/2021   BILITOT 0.7 02/18/2021   ALKPHOS 185 (H) 01/29/2020   AST 15 02/18/2021   ALT 13 02/18/2021   PROT 7.2 03/03/2021   ALBUMIN 3.9 01/29/2020   CALCIUM 9.7 02/18/2021   GFRAA 71 10/15/2020    Speciality Comments: No specialty comments  available.  Procedures:  No procedures performed Allergies: Ace inhibitors, Aspartame, Atorvastatin, Biaxin [clarithromycin], Daliresp [roflumilast], Erythromycin, and Levofloxacin   Assessment / Plan:     Visit Diagnoses: Age-related osteoporosis without current pathological fracture  Encounter for medication monitoring  Vitamin D deficiency  History of vertebral compression fracture  H/O kyphoplasty  Multiple closed fractures of pelvis without disruption of pelvic ring, sequela  Primary osteoarthritis of both hands  Primary osteoarthritis of both knees  DDD (degenerative disc disease), lumbar  Facet arthropathy  History of  hypercholesterolemia  History of gastroesophageal reflux (GERD)  History of anxiety  History of cholecystectomy  History of appendectomy  History of asthma  History of neuropathy  History of hypertension  Orders: No orders of the defined types were placed in this encounter.  No orders of the defined types were placed in this encounter.    Follow-Up Instructions: No follow-ups on file.   Ofilia Neas, PA-C  Note - This record has been created using Dragon software.  Chart creation errors have been sought, but may not always  have been located. Such creation errors do not reflect on  the standard of medical care.

## 2021-03-31 ENCOUNTER — Ambulatory Visit: Payer: PPO | Admitting: Physician Assistant

## 2021-03-31 DIAGNOSIS — Z8719 Personal history of other diseases of the digestive system: Secondary | ICD-10-CM

## 2021-03-31 DIAGNOSIS — Z8679 Personal history of other diseases of the circulatory system: Secondary | ICD-10-CM

## 2021-03-31 DIAGNOSIS — E559 Vitamin D deficiency, unspecified: Secondary | ICD-10-CM

## 2021-03-31 DIAGNOSIS — M47819 Spondylosis without myelopathy or radiculopathy, site unspecified: Secondary | ICD-10-CM

## 2021-03-31 DIAGNOSIS — M170 Bilateral primary osteoarthritis of knee: Secondary | ICD-10-CM

## 2021-03-31 DIAGNOSIS — Z8709 Personal history of other diseases of the respiratory system: Secondary | ICD-10-CM

## 2021-03-31 DIAGNOSIS — Z5181 Encounter for therapeutic drug level monitoring: Secondary | ICD-10-CM

## 2021-03-31 DIAGNOSIS — M810 Age-related osteoporosis without current pathological fracture: Secondary | ICD-10-CM

## 2021-03-31 DIAGNOSIS — Z8639 Personal history of other endocrine, nutritional and metabolic disease: Secondary | ICD-10-CM

## 2021-03-31 DIAGNOSIS — Z9049 Acquired absence of other specified parts of digestive tract: Secondary | ICD-10-CM

## 2021-03-31 DIAGNOSIS — M19041 Primary osteoarthritis, right hand: Secondary | ICD-10-CM

## 2021-03-31 DIAGNOSIS — S3282XS Multiple fractures of pelvis without disruption of pelvic ring, sequela: Secondary | ICD-10-CM

## 2021-03-31 DIAGNOSIS — Z9889 Other specified postprocedural states: Secondary | ICD-10-CM

## 2021-03-31 DIAGNOSIS — Z8659 Personal history of other mental and behavioral disorders: Secondary | ICD-10-CM

## 2021-03-31 DIAGNOSIS — M5136 Other intervertebral disc degeneration, lumbar region: Secondary | ICD-10-CM

## 2021-03-31 DIAGNOSIS — Z8781 Personal history of (healed) traumatic fracture: Secondary | ICD-10-CM

## 2021-03-31 DIAGNOSIS — Z8669 Personal history of other diseases of the nervous system and sense organs: Secondary | ICD-10-CM

## 2021-04-06 ENCOUNTER — Other Ambulatory Visit: Payer: Self-pay | Admitting: Internal Medicine

## 2021-04-07 DIAGNOSIS — N182 Chronic kidney disease, stage 2 (mild): Secondary | ICD-10-CM | POA: Diagnosis not present

## 2021-04-07 DIAGNOSIS — E559 Vitamin D deficiency, unspecified: Secondary | ICD-10-CM | POA: Diagnosis not present

## 2021-04-07 DIAGNOSIS — I10 Essential (primary) hypertension: Secondary | ICD-10-CM | POA: Diagnosis not present

## 2021-04-07 DIAGNOSIS — M5136 Other intervertebral disc degeneration, lumbar region: Secondary | ICD-10-CM | POA: Diagnosis not present

## 2021-04-09 NOTE — Addendum Note (Signed)
Addended by: Cassandria Anger on: 04/09/2021 01:08 PM   Modules accepted: Orders

## 2021-04-09 NOTE — Telephone Encounter (Signed)
Patient cancelled appt on 03/31/21. Will need Prolia labs drawn at [facility] on 04/13/21. Future lab orders for CBC, CMP, and Vitamin D placed today  Knox Saliva, PharmD, MPH, BCPS Clinical Pharmacist (Rheumatology and Pulmonology)

## 2021-04-13 ENCOUNTER — Encounter: Payer: Self-pay | Admitting: Physician Assistant

## 2021-04-13 ENCOUNTER — Ambulatory Visit (INDEPENDENT_AMBULATORY_CARE_PROVIDER_SITE_OTHER): Payer: PPO | Admitting: Physician Assistant

## 2021-04-13 ENCOUNTER — Other Ambulatory Visit: Payer: Self-pay

## 2021-04-13 VITALS — BP 148/71 | HR 76 | Resp 16 | Ht 64.0 in | Wt 148.0 lb

## 2021-04-13 DIAGNOSIS — M170 Bilateral primary osteoarthritis of knee: Secondary | ICD-10-CM | POA: Diagnosis not present

## 2021-04-13 DIAGNOSIS — Z5181 Encounter for therapeutic drug level monitoring: Secondary | ICD-10-CM

## 2021-04-13 DIAGNOSIS — M810 Age-related osteoporosis without current pathological fracture: Secondary | ICD-10-CM

## 2021-04-13 DIAGNOSIS — Z9889 Other specified postprocedural states: Secondary | ICD-10-CM | POA: Diagnosis not present

## 2021-04-13 DIAGNOSIS — Z8781 Personal history of (healed) traumatic fracture: Secondary | ICD-10-CM

## 2021-04-13 DIAGNOSIS — E559 Vitamin D deficiency, unspecified: Secondary | ICD-10-CM

## 2021-04-13 DIAGNOSIS — M19041 Primary osteoarthritis, right hand: Secondary | ICD-10-CM | POA: Diagnosis not present

## 2021-04-13 DIAGNOSIS — S3282XS Multiple fractures of pelvis without disruption of pelvic ring, sequela: Secondary | ICD-10-CM

## 2021-04-13 DIAGNOSIS — Z8669 Personal history of other diseases of the nervous system and sense organs: Secondary | ICD-10-CM

## 2021-04-13 DIAGNOSIS — Z8719 Personal history of other diseases of the digestive system: Secondary | ICD-10-CM

## 2021-04-13 DIAGNOSIS — Z8679 Personal history of other diseases of the circulatory system: Secondary | ICD-10-CM

## 2021-04-13 DIAGNOSIS — Z8709 Personal history of other diseases of the respiratory system: Secondary | ICD-10-CM

## 2021-04-13 DIAGNOSIS — M5136 Other intervertebral disc degeneration, lumbar region: Secondary | ICD-10-CM

## 2021-04-13 DIAGNOSIS — M47819 Spondylosis without myelopathy or radiculopathy, site unspecified: Secondary | ICD-10-CM | POA: Diagnosis not present

## 2021-04-13 DIAGNOSIS — M19042 Primary osteoarthritis, left hand: Secondary | ICD-10-CM

## 2021-04-13 DIAGNOSIS — Z8659 Personal history of other mental and behavioral disorders: Secondary | ICD-10-CM

## 2021-04-13 DIAGNOSIS — Z8639 Personal history of other endocrine, nutritional and metabolic disease: Secondary | ICD-10-CM

## 2021-04-13 DIAGNOSIS — Z9049 Acquired absence of other specified parts of digestive tract: Secondary | ICD-10-CM

## 2021-04-13 NOTE — Telephone Encounter (Signed)
Prolia labs drawn at [facility] today. Will f/u once labs result  Knox Saliva, PharmD, MPH, BCPS Clinical Pharmacist (Rheumatology and Pulmonology)

## 2021-04-13 NOTE — Progress Notes (Signed)
Office Visit Note  Patient: [name] [name]             Date of Birth: 1936/05/09           MRN: [id number]             PCP: [name], MD Referring: [name], MD Visit Date: 04/13/2021 Occupation: @GUAROCC @  Subjective:  Prolia due    History of Present Illness: [name] [name] is a 85 y.o. female with history of osteoporosis and osteoarthritis. She presents today to update lab work and discuss proceeding with her next prolia injection.  She has tolerated Prolia injections in the past without any side effects.  She continues to take vitamin D 5000 units twice daily and a calcium supplement as recommended.  She denies any recent falls or fractures.  She has been using a cane to assist with ambulation.  Her energy level has been stable.  She continues to experience intermittent discomfort in both hands but denies any joint swelling.  She has persistent back pain on a daily basis.  She uses pain patches on occasion as well as alternating heat and ice.  She continues to take Tylenol and gabapentin which improve her back pain.  She has been taking melatonin at bedtime to help her sleep.  Activities of Daily Living:  Patient reports morning stiffness for 10 minutes.   Patient Denies nocturnal pain.  Difficulty dressing/grooming: Denies Difficulty climbing stairs: Denies Difficulty getting out of chair: Denies Difficulty using hands for taps, buttons, cutlery, and/or writing: Denies  Review of Systems  Constitutional:  Negative for fatigue.  HENT:  Positive for mouth dryness. Negative for mouth sores and nose dryness.   Eyes:  Negative for pain, visual disturbance and dryness.  Respiratory:  Negative for cough, hemoptysis and difficulty breathing.   Cardiovascular:  Negative for chest pain, palpitations, hypertension and swelling in legs/feet.  Gastrointestinal:  Negative for blood in stool, constipation and diarrhea.  Endocrine: Negative for excessive thirst and increased  urination.  Genitourinary:  Negative for difficulty urinating and painful urination.  Musculoskeletal:  Positive for joint pain, joint pain and morning stiffness. Negative for joint swelling, myalgias, muscle weakness, muscle tenderness and myalgias.  Skin:  Negative for color change, pallor, rash, hair loss, nodules/bumps, skin tightness, ulcers and sensitivity to sunlight.  Allergic/Immunologic: Negative for susceptible to infections.  Neurological:  Negative for dizziness, numbness, headaches and weakness.  Hematological:  Negative for bruising/bleeding tendency and swollen glands.  Psychiatric/Behavioral:  Negative for depressed mood and sleep disturbance. The patient is not nervous/anxious.    PMFS History:  Patient Active Problem List   Diagnosis Date Noted   Iron deficiency 10/16/2020   History of adenomatous polyp of colon 07/21/2020   Atypical chest pain 07/08/2020   History of fundoplication    Epigastric pain 07/09/2019   Senile purpura ([place]) 07/05/2019   Abnormal glucose (prediabetes) 03/01/2018   FHx: heart disease 03/01/2018   CKD (chronic kidney disease) stage 2, GFR 60-89 ml/min 09/27/2017   T12 compression fracture ([place]) 09/26/2017   Chronic bronchitis ([place]) 08/09/2017   Atherosclerosis of aorta (HCC) by Lumbar CYT scan 01/29/2021 04/05/2017   Odynophagia    Spinal stenosis, lumbar region, with neurogenic claudication 05/29/2015   DDD (degenerative disc disease), lumbar 04/21/2015   Hyperlipidemia, mixed 11/24/2014   Gastroesophageal reflux disease 11/24/2014   Depression, major, in partial remission ([place]) 09/26/2014   Chronic pain syndrome 09/26/2014   Coronary artery disease involving native heart without angina pectoris  Osteoporosis 02/10/2014   Vitamin D deficiency 08/14/2013   MGUS (monoclonal gammopathy of unknown significance) 03/05/2013   ESOPHAGEAL STRICTURE 08/28/2008   Mild intermittent asthma without complication 43/15/4008   Incarcerated recurrent  paraesophageal hiatal hernia s/p lap redo repair [id number] 01/31/2008   Essential hypertension 07/25/2007    Past Medical History:  Diagnosis Date   Anxiety    takes Xanax daily as needed   Asthma    Albuterol daily as needed   Blood transfusion without reported diagnosis    CAD (coronary artery disease)    LHC 2003 with 40% pLAD, 30% mLAD, 100% D1 (moderate vessel), 100%  D2 (small vessel), 95% mid small OM2.  Pt had PTCA to D1;  D2 and OM2 small vessels and tx medically;  Last Myoview (2012): anterior infarct seen with scar, no ischemia. EF normal. Med Rx // Myoview (12/14):  Low risk; ant scar w/ peri-infarct ischemia; EF 55% with ant AK // Myoview 5/16: EF 55-65, ant, apical scar, no ischemia    Cataract    Chronic back pain    HNP, DDD, spinal stenosis, vertebral compression fractures.    Chronic nausea    takes Zofran daily as needed.  normal gastric emptying study 03/2013   CKD (chronic kidney disease), stage III ([place]) 09/27/2017   Constipation    felt to be functional. takes Senokot and Miralax daily.  treated with Linzess in past.    DIVERTICULOSIS, COLON 08/28/2008   Qualifier: Diagnosis of  By: [name], [name], [name]     Elevated LFTs 2015   probably med induced DILI, from Augmentin vs Pravastatin   GERD (gastroesophageal reflux disease)    hx of esophageal stricture    Hiatal hernia    Paraesophageal hernias. s/p fundoplications in 9326 and 04/2013   History of bronchitis several of yrs ago   History of colon polyps 03/2009, 03/2011   adenomatous colon polyps, no high grade dysplasia.    History of vertigo    no meds   Hyperlipidemia    takes Pravastatin daily   Hypertension    takes Amlodipine,Metoprolol,and Losartan daily   Multiple closed pelvic fractures without disruption of pelvic circle (HCC)    Nausea 03/2016   Osteoporosis    PONV (postoperative nausea and vomiting)    yrs ago   Pubic ramus fracture (HCC) 12/20/2019   Slow urinary stream    takes Rapaflo  daily   TIA (transient ischemic attack) 1995   no residual effects noted   Vertebral compression fracture ([place]) 06/23/2012    Family History  Problem Relation Age of Onset   Colon cancer Mother 63   Heart attack Father 52       heart attack   Brain cancer Brother        tumor    Heart disease Brother    Heart disease Brother    Kidney disease Daughter    Esophageal cancer Neg Hx    Stomach cancer Neg Hx    Past Surgical History:  Procedure Laterality Date   APPENDECTOMY     patient unsure of date   BACK [place]     Patient unsure of date.   CORONARY ANGIOPLASTY  11/16/2001   1 stent   ESOPHAGEAL MANOMETRY N/A 03/25/2013   Procedure: ESOPHAGEAL MANOMETRY (EM);  Surgeon: Inda Castle, MD;  Location: [facility];  Service: Endoscopy;  Laterality: N/A;   ESOPHAGOGASTRODUODENOSCOPY N/A 03/15/2013   Procedure: ESOPHAGOGASTRODUODENOSCOPY (EGD);  Surgeon: Jerene Bears, MD;  Location: [facility] ENDOSCOPY;  Service: Endoscopy;  Laterality: N/A;   ESOPHAGOGASTRODUODENOSCOPY N/A 12/25/2015   Procedure: ESOPHAGOGASTRODUODENOSCOPY (EGD);  Surgeon: Doran Stabler, MD;  Location: [facility] ENDOSCOPY;  Service: Endoscopy;  Laterality: N/A;   EYE SURGERY  11/2000   bilateral cataracts with lens implant   FIXATION KYPHOPLASTY LUMBAR SPINE  X 2   "L3-4"   HEMORRHOID SURGERY     HIATAL HERNIA REPAIR  06/03/2011   Procedure: LAPAROSCOPIC REPAIR OF HIATAL HERNIA;  Surgeon: Adin Hector, MD;  Location: [facility];  Service: General;  Laterality: N/A;   HIATAL HERNIA REPAIR N/A 04/24/2013   Procedure: LAPAROSCOPIC  REPAIR RECURRENT PARASOPHAGEAL HIATAL HERNIA WITH FUNDOPLICATION;  Surgeon: Adin Hector, MD;  Location: [facility];  Service: General;  Laterality: N/A;   INSERTION OF MESH N/A 04/24/2013   Procedure: INSERTION OF MESH;  Surgeon: Adin Hector, MD;  Location: [facility];  Service: General;  Laterality: N/A;   IR KYPHO THORACIC WITH BONE BIOPSY  09/29/2017   IR  KYPHO THORACIC WITH BONE BIOPSY  11/03/2017   IR KYPHO THORACIC WITH BONE BIOPSY  02/14/2019   IR KYPHO THORACIC WITH BONE BIOPSY  04/02/2019   LAPAROSCOPIC LYSIS OF ADHESIONS N/A 04/24/2013   Procedure: LAPAROSCOPIC LYSIS OF ADHESIONS;  Surgeon: Adin Hector, MD;  Location: [facility];  Service: General;  Laterality: N/A;   LAPAROSCOPIC NISSEN FUNDOPLICATION  7/61/9509   Procedure: LAPAROSCOPIC NISSEN FUNDOPLICATION;  Surgeon: Adin Hector, MD;  Location: [facility];  Service: General;  Laterality: N/A;   LUMBAR FUSION  12/07/2015   Right L2-3 facetectomy, posterolateral fusion L2-3 with pedicle screws, rods, local bone graft, Vivigen cancellous chips. Fusion extended to the L1 level with pedicle screws and rods   LUMBAR LAMINECTOMY/DECOMPRESSION MICRODISCECTOMY Right 06/26/2012   Procedure: LUMBAR LAMINECTOMY/DECOMPRESSION MICRODISCECTOMY;  Surgeon: Jessy Oto, MD;  Location: [place];  Service: Orthopedics;  Laterality: Right;  RIGHT L4-5 MICRODISCECTOMY   LUMBAR LAMINECTOMY/DECOMPRESSION MICRODISCECTOMY N/A 05/29/2015   Procedure: RIGHT L2-3 MICRODISCECTOMY;  Surgeon: Jessy Oto, MD;  Location: [place];  Service: Orthopedics;  Laterality: N/A;   TOTAL ABDOMINAL HYSTERECTOMY  1975   partial   UPPER GASTROINTESTINAL ENDOSCOPY     Social History   Social History Narrative   Lives with husband.   Right-handed.   3 cups caffeine per day.   Immunization History  Administered Date(s) Administered   DT (Pediatric) 05/06/2014   Influenza Split 02/07/2011   Influenza Whole 02/09/2009, 01/19/2010, 02/13/2012   Influenza, High Dose Seasonal PF 03/22/2013, 01/15/2014, 02/27/2015, 01/14/2016, 01/18/2017, 02/14/2018, 02/14/2019, 03/16/2020   PFIZER(Purple Top)SARS-COV-2 Vaccination 05/25/2019, 06/15/2019, 03/24/2020   Pneumococcal Conjugate-13 05/06/2014   Pneumococcal Polysaccharide-23 02/09/2011   Pneumococcal-Unspecified 02/06/2010, 02/09/2012   Td 05/10/2003     Objective: Vital Signs: BP  (!) 148/71 (BP Location: Left Arm, Patient Position: Sitting, Cuff Size: Normal)   Pulse 76   Resp 16   Ht 5\' 4"  (1.626 m)   Wt 148 lb (67.1 kg)   BMI 25.40 kg/m    Physical Exam Vitals and nursing note reviewed.  Constitutional:      Appearance: She is well-developed.  HENT:     Head: Normocephalic and atraumatic.  Eyes:     Conjunctiva/sclera: Conjunctivae normal.  Pulmonary:     Effort: Pulmonary effort is normal.  Abdominal:     Palpations: Abdomen is soft.  Musculoskeletal:     Cervical back: Normal range of motion.  Skin:  General: Skin is warm and dry.     Capillary Refill: Capillary refill takes less than 2 seconds.  Neurological:     Mental Status: She is alert and oriented to person, place, and time.  Psychiatric:        Behavior: Behavior normal.     Musculoskeletal Exam: C-spine has limited ROM with lateral rotation.  Thoracic kyphosis noted.  Midline spinal tenderness in the thoracic and lumbar spine.  Shoulder joints, elbow joints, wrist joints, MCPs, PIPs, and DIPs good ROM with no synovitis.  PIP and DIP thickening consistent with osteoarthritis of both hands.  Hip joints limited ROM with discomfort bilaterally.  Knee joints have good ROM with no warmth or effusion.  Ankle joints have good ROM with no tenderness or inflammation.   CDAI Exam: CDAI Score: -- Patient Global: --; Provider Global: -- Swollen: --; Tender: -- Joint Exam 04/13/2021   No joint exam has been documented for this visit   There is currently no information documented on the homunculus. Go to the Rheumatology activity and complete the homunculus joint exam.  Investigation: No additional findings.  Imaging: No results found.  Recent Labs: Lab Results  Component Value Date   WBC 9.1 02/18/2021   HGB 15.1 02/18/2021   PLT 365 02/18/2021   NA 140 02/18/2021   K 4.1 02/18/2021   CL 102 02/18/2021   CO2 27 02/18/2021   GLUCOSE 97 02/18/2021   BUN 12 02/18/2021   CREATININE  0.86 02/18/2021   BILITOT 0.7 02/18/2021   ALKPHOS 185 (H) 01/29/2020   AST 15 02/18/2021   ALT 13 02/18/2021   PROT 7.2 03/03/2021   ALBUMIN 3.9 01/29/2020   CALCIUM 9.7 02/18/2021   GFRAA 71 10/15/2020    Speciality Comments: No specialty comments available.  Procedures:  No procedures performed Allergies: Ace inhibitors, Aspartame, Atorvastatin, Biaxin [clarithromycin], Daliresp [roflumilast], Erythromycin, and Levofloxacin   Assessment / Plan:     Visit Diagnoses: Age-related osteoporosis without current pathological fracture - DEXA 11/12/18: The BMD measured at Femur Neck Left is 0.657 g/cm2 with a T-score of -2.7.  Her next DEXA is scheduled on 04/16/21.  She has a history of vertebral fractures/several kyphoplasty. She had a fall in August 2021 and fractured the ramus of right pubis. She continues to have chronic thoracic and lumbar spine pain.  Thoracic kyphosis (unchanged) and midline spinal tenderness was noted on exam.  She has been using a cane to assist with ambulation. No recent falls or fractures.  Her last prolia injection was on 10/12/20.  CBC, CMP, and vitamin D will be updated today prior to scheduling next prolia injection. She has been taking vitamin D 5000 units twice daily and calcium supplement daily.   She will follow up in 6 months.   Plan: CBC with Differential/Platelet, COMPLETE METABOLIC PANEL WITH GFR, VITAMIN D 25 Hydroxy (Vit-D Deficiency, Fractures)  Vitamin D deficiency -She is taking vitamin D 5000 units twice daily.  Vitamin D level will be updated today prior to scheduling next prolia injection.  Plan: VITAMIN D 25 Hydroxy (Vit-D Deficiency, Fractures)  Encounter for medication monitoring -CBC and CMP will be updated today prior to scheduling next prolia injection.  Plan: CBC with Differential/Platelet, COMPLETE METABOLIC PANEL WITH GFR, VITAMIN D 25 Hydroxy (Vit-D Deficiency, Fractures)  History of vertebral compression fracture: H/o kyphoplasty   H/O  kyphoplasty  Multiple closed fractures of pelvis without disruption of pelvic ring, sequela: H/o.  She has chronic lower back and pelvis pain.  Primary osteoarthritis of both hands: She has PIP and DIP thickening consistent with OA of both hands.  She experiences underwent discomfort in her DIP joints.  She is able to make a complete fist bilaterally.  Discussed the importance of joint protection and muscle strengthening.  Also discussed the use of Voltaren gel which she can buy topically as needed for pain relief.  She can also try using arthritis compression gloves.  She was advised to notify us if her discomfort persists or worsens.  Primary osteoarthritis of both knees: She has good range of motion in both knee joints with no discomfort.  No warmth or effusion was noted on examination today.  DDD (degenerative disc disease), lumbar: Chronic pain.  She has midline spinal tenderness in the thoracic and lumbar region.  She experiences discomfort in her lower back on a daily basis.  She has been using a cane to assist with ambulation.  Facet arthropathy: Chronic pain.  No symptoms of radiculopathy.   Other medical conditions are listed as follows:   History of hypercholesterolemia  History of gastroesophageal reflux (GERD)  History of anxiety  History of cholecystectomy  History of appendectomy  History of asthma  History of neuropathy  History of hypertension: BP was 148/71 today in the office.   Orders: Orders Placed This Encounter  Procedures   CBC with Differential/Platelet   COMPLETE METABOLIC PANEL WITH GFR   VITAMIN D 25 Hydroxy (Vit-D Deficiency, Fractures)    No orders of the defined types were placed in this encounter.     Follow-Up Instructions: Return in 6 months (on 10/12/2021) for Osteoporosis, Osteoarthritis.   Ofilia Neas, PA-C  Note - This record has been created using Dragon software.  Chart creation errors have been sought, but may not always   have been located. Such creation errors do not reflect on  the standard of medical care.

## 2021-04-14 ENCOUNTER — Telehealth: Payer: Self-pay

## 2021-04-14 LAB — COMPLETE METABOLIC PANEL WITH GFR
AG Ratio: 1.6 (calc) (ref 1.0–2.5)
ALT: 13 U/L (ref 6–29)
AST: 15 U/L (ref 10–35)
Albumin: 4.1 g/dL (ref 3.6–5.1)
Alkaline phosphatase (APISO): 46 U/L (ref 37–153)
BUN: 18 mg/dL (ref 7–25)
CO2: 24 mmol/L (ref 20–32)
Calcium: 9.4 mg/dL (ref 8.6–10.4)
Chloride: 104 mmol/L (ref 98–110)
Creat: 0.82 mg/dL (ref 0.60–0.95)
Globulin: 2.6 g/dL (calc) (ref 1.9–3.7)
Glucose, Bld: 93 mg/dL (ref 65–99)
Potassium: 4.1 mmol/L (ref 3.5–5.3)
Sodium: 140 mmol/L (ref 135–146)
Total Bilirubin: 0.8 mg/dL (ref 0.2–1.2)
Total Protein: 6.7 g/dL (ref 6.1–8.1)
eGFR: 71 mL/min/{1.73_m2} (ref >=60)

## 2021-04-14 LAB — CBC WITH DIFFERENTIAL/PLATELET
Absolute Monocytes: 614 cells/uL (ref 200–950)
Basophils Absolute: 81 cells/uL (ref 0–200)
Basophils Relative: 1.3 %
Eosinophils Absolute: 601 cells/uL — ABNORMAL HIGH (ref 15–500)
Eosinophils Relative: 9.7 %
HCT: 43.1 % (ref 35.0–45.0)
Hemoglobin: 14.4 g/dL (ref 11.7–15.5)
Lymphs Abs: 1922 cells/uL (ref 850–3900)
MCH: 32.3 pg (ref 27.0–33.0)
MCHC: 33.4 g/dL (ref 32.0–36.0)
MCV: 96.6 fL (ref 80.0–100.0)
MPV: 10.3 fL (ref 7.5–12.5)
Monocytes Relative: 9.9 %
Neutro Abs: 2982 cells/uL (ref 1500–7800)
Neutrophils Relative %: 48.1 %
Platelets: 304 10*3/uL (ref 140–400)
RBC: 4.46 10*6/uL (ref 3.80–5.10)
RDW: 12.7 % (ref 11.0–15.0)
Total Lymphocyte: 31 %
WBC: 6.2 10*3/uL (ref 3.8–10.8)

## 2021-04-14 LAB — VITAMIN D 25 HYDROXY (VIT D DEFICIENCY, FRACTURES): Vit D, 25-Hydroxy: 98 ng/mL (ref 30–100)

## 2021-04-14 NOTE — Progress Notes (Signed)
Absolute eosinophils are slightly elevated but trending down.  Rest of CBC WNL.  CMP WNL.  Vitamin D WNL.

## 2021-04-14 NOTE — Telephone Encounter (Signed)
Thanks Jan, hemoglobin is normal, no further work-up from our end recommended.  Her PCP can continue to follow the CBCs.  Thanks

## 2021-04-14 NOTE — Telephone Encounter (Signed)
-----   Message from Roetta Sessions, [place] sent at 01/07/2021  1:49 PM EDT ----- Regarding: cbc due next couple of weeks Patient will be due for CBC in mid December

## 2021-04-14 NOTE — Telephone Encounter (Signed)
Patient due for CBC for Dr. Havery Moros. Patient had labs yesterday for [place], [place].  For your review, Dr. Loni Muse

## 2021-04-15 ENCOUNTER — Other Ambulatory Visit: Payer: Self-pay | Admitting: Pharmacist

## 2021-04-15 DIAGNOSIS — M810 Age-related osteoporosis without current pathological fracture: Secondary | ICD-10-CM

## 2021-04-15 DIAGNOSIS — M150 Primary generalized (osteo)arthritis: Secondary | ICD-10-CM | POA: Diagnosis not present

## 2021-04-15 DIAGNOSIS — S3282XS Multiple fractures of pelvis without disruption of pelvic ring, sequela: Secondary | ICD-10-CM

## 2021-04-15 DIAGNOSIS — M4726 Other spondylosis with radiculopathy, lumbar region: Secondary | ICD-10-CM | POA: Diagnosis not present

## 2021-04-15 DIAGNOSIS — Z8781 Personal history of (healed) traumatic fracture: Secondary | ICD-10-CM

## 2021-04-15 DIAGNOSIS — G894 Chronic pain syndrome: Secondary | ICD-10-CM | POA: Diagnosis not present

## 2021-04-15 DIAGNOSIS — M961 Postlaminectomy syndrome, not elsewhere classified: Secondary | ICD-10-CM | POA: Diagnosis not present

## 2021-04-15 NOTE — Telephone Encounter (Signed)
Called patient and advised that Prolia orders have been placed with [facility]. Provided with phone number to schedule Prolia injection - she pans to call tomorrow  Knox Saliva, PharmD, MPH, BCPS Clinical Pharmacist (Rheumatology and Pulmonology)

## 2021-04-15 NOTE — Progress Notes (Signed)
Next injection not yet scheduled for Prolia SQ and due for updated orders. Patient is due for Prolia on or after 04/10/21 Diagnosis: age-related osteoporosis  Dose: 60mg  SQ every 6 months  Last Clinic Visit: 04/13/21 Next Clinic Visit: 10/14/21  Last infusion: 10/12/20  Labs: 12/6/2 - Vitamin D, calcium wnl to proceed with Prolia  Orders placed for Prolia SQ x 1 dose. No premeds required  Called patient and provided with phone number for: [facility] [phone]) [facility]. She states that she will plan to call tomorrow.  Will follow-up to ensured scheduled and completed.  Knox Saliva, PharmD, MPH, BCPS Clinical Pharmacist (Rheumatology and Pulmonology)

## 2021-04-16 ENCOUNTER — Other Ambulatory Visit: Payer: PPO

## 2021-04-19 ENCOUNTER — Other Ambulatory Visit: Payer: Self-pay

## 2021-04-19 DIAGNOSIS — Z79899 Other long term (current) drug therapy: Secondary | ICD-10-CM

## 2021-04-19 DIAGNOSIS — K219 Gastro-esophageal reflux disease without esophagitis: Secondary | ICD-10-CM

## 2021-04-19 MED ORDER — SUCRALFATE 1 G PO TABS: ORAL_TABLET | ORAL | 3 refills | Status: DC

## 2021-04-20 NOTE — Progress Notes (Signed)
Patient scheduled Prolia for 04/27/21. Will f/u to ensure completed  Knox Saliva, PharmD, MPH, BCPS Clinical Pharmacist (Rheumatology and Pulmonology)

## 2021-04-26 ENCOUNTER — Other Ambulatory Visit (HOSPITAL_COMMUNITY): Payer: Self-pay | Admitting: *Deleted

## 2021-04-27 ENCOUNTER — Ambulatory Visit (HOSPITAL_COMMUNITY)
Admission: RE | Admit: 2021-04-27 | Discharge: 2021-04-27 | Disposition: A | Discharge status: Home / Self Care | Payer: PPO | Source: Ambulatory Visit | Attending: Rheumatology | Admitting: Rheumatology

## 2021-04-27 ENCOUNTER — Other Ambulatory Visit: Payer: Self-pay

## 2021-04-27 ENCOUNTER — Other Ambulatory Visit: Payer: Self-pay | Admitting: Adult Health

## 2021-04-27 DIAGNOSIS — M810 Age-related osteoporosis without current pathological fracture: Secondary | ICD-10-CM | POA: Diagnosis not present

## 2021-04-27 DIAGNOSIS — Z8781 Personal history of (healed) traumatic fracture: Secondary | ICD-10-CM | POA: Diagnosis not present

## 2021-04-27 DIAGNOSIS — S3282XS Multiple fractures of pelvis without disruption of pelvic ring, sequela: Secondary | ICD-10-CM | POA: Insufficient documentation

## 2021-04-27 MED ORDER — DENOSUMAB 60 MG/ML ~~LOC~~ SOSY
PREFILLED_SYRINGE | SUBCUTANEOUS | Status: AC
  Filled 2021-04-27: qty 1

## 2021-04-27 MED ORDER — DENOSUMAB 60 MG/ML ~~LOC~~ SOSY
60.0000 mg | PREFILLED_SYRINGE | Freq: Once | SUBCUTANEOUS | Status: AC
  Administered 2021-04-27: 12:00:00 60 mg via SUBCUTANEOUS

## 2021-05-12 ENCOUNTER — Ambulatory Visit: Payer: PPO | Admitting: Internal Medicine

## 2021-05-12 ENCOUNTER — Telehealth: Payer: Self-pay | Admitting: Internal Medicine

## 2021-05-12 ENCOUNTER — Other Ambulatory Visit: Payer: Self-pay

## 2021-05-12 ENCOUNTER — Encounter: Payer: Self-pay | Admitting: Internal Medicine

## 2021-05-12 DIAGNOSIS — J4520 Mild intermittent asthma, uncomplicated: Secondary | ICD-10-CM | POA: Diagnosis not present

## 2021-05-12 MED ORDER — PREDNISONE 10 MG PO TABS: ORAL_TABLET | ORAL | 0 refills | Status: DC

## 2021-05-12 MED ORDER — BUDESONIDE-FORMOTEROL FUMARATE 80-4.5 MCG/ACT IN AERO: INHALATION_SPRAY | RESPIRATORY_TRACT | 11 refills | Status: DC

## 2021-05-12 MED ORDER — AZITHROMYCIN 250 MG PO TABS: ORAL_TABLET | ORAL | 0 refills | Status: DC

## 2021-05-12 NOTE — Progress Notes (Signed)
Subjective:   Patient ID: [name] [name], female    DOB: [birth year]    MRN: [id number]     Brief patient profile:  85yowf never smoker with large hiatal hernia & recurrent asthmatic bronchitis , about twice/yr for many yrs. Lung parenchyma looks ok on Ct scans 10/2006 & 12/10. PFTs nml in 12/09 & mild airway obstruction in 08/2009.  First presented 3/09 . CT neck for thyroid imaging s/o mediastinal contour abnormal. Ct chest >> large hiatal hernia & BL upper lobe hazy , ground glass opacity.  Dec, 2010  [name] [name])  Fe def anemia requiring transfusions, small bowel AVMs suspected.  Mar 2011 Acute visit- c/o cough and wheezing >> augmentin helped, enalapril changed to diovan       11/19/2013 acute  ov/[name] [name] re:  [name] [name] since July 4th 2015  Chief Complaint  Patient presents with   Acute Visit    Pt c/o cough, congestion and sinus pressure x 1 wk. Cough is prod with minimal to moderate greenish yellow sputum.     symptoms were acute "like a head cold" then into chest, progressive, with sob with exertion but not at rest Zpak July 10  [facility] 2 weeks prior to OV   got kyphoplasty, then sinus congestion Used neb am of of with good results  rec Augmentin 875 mg take one pill twice daily  X 10 days - take at breakfast and supper with large glass of water.  It would help reduce the usual side effects (diarrhea and yeast infections) if you ate cultured yogurt at lunch.  Prednisone 10 mg take  4 each am x 2 days,   2 each am x 2 days,  1 each am x 2 days and stop  Work on inhaler technique:  relax and gently blow all the way out then take a nice smooth deep breath back in, triggering the inhaler at same time you start breathing in.  Hold for up to 5 seconds if you can.  Rinse and gargle with water when done Return in 2 weeks if not completely better   > did not return      09/08/2017 1st [facility] office visit/ [name] [name]  Re AB  Chief Complaint  Patient presents with   Pulmonary Consult    Last seen  here in July 2015. She states that she is here for just "check up". Breathing is overall doing well.  She rarely uses her albuterol inhaler.   uses dulera 200 when over does it  Sleeping s resp problems  No cough  Walks at [employer] at a slow pace with a cane and not really limited by doe   rec In the event of cough/ wheeze or worse short of breath > dulera  100 or 200 up to 2 pffs every 12 hours Work on inhaler technique:   Plan B = Backup Only use your albuterol as a rescue medication Plan C = Crisis - only use your albuterol nebulizer if you first try Plan B and it fails to help > ok to use the nebulizer up to every 4 hours but if start needing it regularly call for immediate appointment    07/20/20 NP eval for acute exac rec Zpack take as directed  Mucinex DM liquid Twice daily  As needed  Cough/congestion  Prednisone taper over next week .  Albuterol inhaler As needed   Continue on BREZTRI 2 puffs Twice daily , brush/rinse/gargle after use.  Follow up with Dr. Melvyn [name]  In 6-8 weeks  with PFTs and As needed  Please contact office for sooner follow up if symptoms do not improve or worsen or seek emergency care       09/02/2020  Re-establish ov/[name] [name] re: ? Asthma maint on advair 250/50 Chief Complaint  Patient presents with   Follow-up    Had PFT today-sob worse at times, cough yellow   Dyspnea:  Steps x one flight > sob/ shops anywhere at nl pace = MMRC1 = can walk nl pace, flat grade, can't hurry or go uphills or steps s sob   Cough: assoc with sob flares   Sleeping:bed is 10 degrees/ two pillows on side SABA use: no change  02: none  Covid status:   X 3 vax rec Change advair to 250/50 one twice daily  Please schedule a follow up office visit in 6 weeks, call sooner if needed  Error:  We originally recorded advair at 500 but it was 250 so the reduction should have been to the 100/50 bid/ pt informed by phone of th error on the avs but the correct rx was submitted for the 100/50  strength   10/14/2020  f/u ov/[name] [name] re:    Asthma worse cough  actually  on 500/50 after all (very confused with names of meds and strengths) "it's whatever they gave me"  No chief complaint on file. Dyspnea: MMRC1 = can walk nl pace, flat grade, can't hurry or go uphills or steps s sob   Cough: worse with exertion esp outdoors  Sleeping: 10 degrees/ on side with 2pillows under head  SABA use: not using  02: none Covid status:   vax x 3  Rec Plan A = Automatic = Always=    dulera 100 (or Symbicort 80) Take 2 puffs first thing in am and then another 2 puffs about 12 hours later.  Work on inhaler technique:   Plan B = Backup (to supplement plan A, not to replace it) Only use your albuterol inhaler as a rescue medication      05/12/2021  f/u ov/[name] [name] re: asthma / uacs   maint on symbicort 80 2bid   Chief Complaint  Patient presents with   Follow-up    Coughing since had covid 19 in Aug 2022- prod with green to yellow sputum.   Dyspnea: typically ot limited by breathing from desired activities  / limited by balance uses cane  Cough: more since covid / green in am assoc subj wheeze s sinus cc Sleeping: 10 degrees on bed blocks does fine SABA use: twice daily - misunderstood instructions 02: none Covid status:   vax x 3  and infected x one    No obvious day to day or daytime variability or assoc  mucus plugs or hemoptysis or cp or chest tightness or overt sinus or hb symptoms.   sleeping without nocturnal  or early am exacerbation  of respiratory  c/o's or need for noct saba. Also denies any obvious fluctuation of symptoms with weather or environmental changes or other aggravating or alleviating factors except as outlined above   No unusual exposure hx or h/o childhood pna/ asthma or knowledge of premature birth.  Current Allergies, Complete Past Medical History, Past Surgical History, Family History, and Social History were reviewed in [employer] record.  ROS   The following are not active complaints unless bolded Hoarseness, sore throat, dysphagia, dental problems, itching, sneezing,  nasal congestion or discharge of excess mucus or purulent secretions, ear ache,   fever, chills,  sweats, unintended wt loss or wt gain, classically pleuritic or exertional cp,  orthopnea pnd or arm/hand swelling  or leg swelling, presyncope, palpitations, abdominal pain, anorexia, nausea, vomiting, diarrhea  or change in bowel habits or change in bladder habits, change in stools or change in urine, dysuria, hematuria,  rash, arthralgias, visual complaints, headache, numbness, weakness or ataxia or problems with walking or coordination,  change in mood or  memory.        Current Meds  Medication Sig   albuterol (VENTOLIN HFA) 108 (90 Base) MCG/ACT inhaler INHALE 2 PUFFS BY MOUTH EVERY 6 HOURS AS NEEDED FOR WHEEZING FOR SHORTNESS OF BREATH   amLODipine (NORVASC) 10 MG tablet TAKE 1 TABLET BY MOUTH ONCE DAILY FOR BLOOD PRESSURE AND FOR HEART   aspirin EC 81 MG tablet Take 81 mg by mouth at bedtime. Every other night   budesonide-formoterol (SYMBICORT) 80-4.5 MCG/ACT inhaler Take 2 puffs first thing in am and then another 2 puffs about 12 hours later.   Cholecalciferol (VITAMIN D3) 5000 units CAPS Take 5,000 Units by mouth 2 (two) times daily.    cyclobenzaprine (FLEXERIL) 10 MG tablet Take  1 tablet  3 x /day  as needed for back pain /muscle spasm   denosumab (PROLIA) 60 MG/ML SOSY injection Inject 60 mg into the skin every 6 (six) months.   diclofenac sodium (VOLTAREN) 1 % GEL Apply 2 g topically 4 (four) times daily. (Patient taking differently: Apply 2 g topically 4 (four) times daily as needed (pain).)   DULoxetine (CYMBALTA) 30 MG capsule TAKE 1 CAPSULE BY MOUTH ONCE DAILY, FOR MOOD AND CHRONIC PAIN   fluticasone-salmeterol (ADVAIR) 500-50 MCG/ACT AEPB Inhale 1 puff into the lungs in the morning and at bedtime.   gabapentin (NEURONTIN) 400 MG capsule Take 400 mg by mouth 4  (four) times daily.   ketoconazole (NIZORAL) 2 % cream Apply 1 application topically 2 x /day (Patient taking differently: Apply 1 application topically 2 (two) times daily. Apply 1 application topically 2 x /day)   Magnesium 400 MG TABS Take 400 mg by mouth daily at 2 PM.   melatonin 5 MG TABS Take 5 mg by mouth at bedtime as needed.   metoprolol tartrate (LOPRESSOR) 50 MG tablet TAKE 1 TABLET BY MOUTH TWICE DAILY FOR BLOOD PRESSURE AND FOR HEART   Multiple Vitamins-Minerals (MULTIVITAMIN WITH MINERALS) tablet Take 1 tablet by mouth daily.   nitroGLYCERIN (NITROSTAT) 0.4 MG SL tablet Place 1 tablet (0.4 mg total) under the tongue every 5 (five) minutes as needed for chest pain.   omeprazole (PRILOSEC) 40 MG capsule Take 40 mg by mouth in the morning and at bedtime.   promethazine (PHENERGAN) 25 MG tablet Take  1 tablet  Every 4 hours ONLY if needed for Nausea & Vomiting                  /TAKE 1 TABLET BY MOUTH EVERY 4 HOURS AS NEEDED FOR NAUSEA AND VOMITING   senna-docusate (SENOKOT-S) 8.6-50 MG tablet Take 2 tablets by mouth 2 (two) times daily.   sucralfate (CARAFATE) 1 g tablet TAKE 1 TABLET BY MOUTH 4 TIMES DAILY BEFORE MEAL(S) AND AT BEDTIME FOR INDIGESTION AND FOR HEARTBURN   trospium (SANCTURA) 20 MG tablet Take 20 mg by mouth 2 (two) times daily.   vitamin B-12 (CYANOCOBALAMIN) 1000 MCG tablet Take 5,000 mcg by mouth daily.   Current Facility-Administered Medications for the 05/12/21 encounter (Office Visit) with [name], MD  Medication  calcitonin (salmon) (MIACALCIN/FORTICAL) nasal spray 1 spray                    Objective:   Physical Exam  Wts    05/12/2021          143  10/14/2020          150 09/02/2020        152   09/08/2017         164   11/19/13 132 lb (59.875 kg)  11/14/13 127 lb (57.607 kg)  11/05/13 125 lb (56.7 kg)     Vital signs reviewed  05/12/2021  - Note at rest 02 sats  93% on RA   General appearance:    pleasant amb wf nad     HEENT : pt wearing  mask not removed for exam due to covid -19 concerns.    NECK :  without JVD/Nodes/TM/ nl carotid upstrokes bilaterally   LUNGS: no acc muscle use,  Nl contour chest insp / exp distant wheeze    CV:  RRR  no s3 or murmur or increase in P2, and no edema   ABD:  soft and nontender with nl inspiratory excursion in the supine position. No bruits or organomegaly appreciated, bowel sounds nl  MS:  Nl gait/ ext warm without deformities, calf tenderness, cyanosis or clubbing No obvious joint restrictions   SKIN: warm and dry without lesions    NEURO:  alert, approp, nl sensorium with  no motor or cerebellar deficits apparent.             Assessment & Plan:

## 2021-05-12 NOTE — Assessment & Plan Note (Addendum)
09/08/2017  After extensive coaching inhaler device  effectiveness =    75% > continue dulera 100 2 bid prn  - Spirometry 09/08/2017  wnl with min curvature off rx  - PFT's  09/02/2020  FEV1 1.38 (73 % ) ratio 0.77  p 11 % improvement from saba p advair 250  prior to study with DLCO  19.41 (103%) corrects to 5.38 (131%)  for alv volume and FV curve minimally concave  - 10/14/2020  After extensive coaching inhaler device,  effectiveness =    75% so try symbicort 80 2 bid   - 05/12/2021  After extensive coaching inhaler device,  effectiveness =    75% late trigger, better T insp  Mild flare p covid so rx pred x 6 days, zpak and work on inhaler technique  Re saba Re SABA :  I spent extra time with pt today reviewing appropriate use of albuterol for prn use on exertion with the following points: 1) saba is for relief of sob that does not improve by walking a slower pace or resting but rather if the pt does not improve after trying this first. 2) If the pt is convinced, as many are, that saba helps recover from activity faster then it's easy to tell if this is the case by re-challenging : ie stop, take the inhaler, then p 5 minutes try the exact same activity (intensity of workload) that just caused the symptoms and see if they are substantially diminished or not after saba 3) if there is an activity that reproducibly causes the symptoms, try the saba 15 min before the activity on alternate days   If in fact the saba really does help, then fine to continue to use it prn but advised may need to look closer at the maintenance regimen being used to achieve better control of airways disease with exertion.          Each maintenance medication was reviewed in detail including emphasizing most importantly the difference between maintenance and prns and under what circumstances the prns are to be triggered using an action plan format where appropriate.  Total time for H and P, chart review, counseling, reviewing hfa  device(s) and generating customized AVS unique to this office visit / same day charting = 25 min

## 2021-05-12 NOTE — Patient Instructions (Signed)
Zpak  Prednisone 10 mg take  4 each am x 2 days,   2 each am x 2 days,  1 each am x 2 days and stop   Remember: Only use your albuterol as a rescue medication to be used if you can't catch your breath by resting or doing a relaxed purse lip breathing pattern.  - The less you use it, the better it will work when you need it. - Ok to use up to 2 puffs  every 4 hours if you must but call for immediate appointment if use goes up over your usual need - Don't leave home without it !!  (think of it like the spare tire for your car)   Ok to try albuterol 15 min before an activity (on alternating days)  that you know would usually make you short of breath and see if it makes any difference and if makes none then don't take albuterol after activity unless you can't catch your breath as this means it's the resting that helps, not the albuterol.   Please schedule a follow up visit in 6 months but call sooner if needed

## 2021-05-13 MED ORDER — MOMETASONE FURO-FORMOTEROL FUM 100-5 MCG/ACT IN AERO: 2.0000 | INHALATION_SPRAY | Freq: Two times a day (BID) | RESPIRATORY_TRACT | 2 refills | Status: DC

## 2021-05-13 NOTE — Telephone Encounter (Signed)
Dulera 100 Take 2 puffs first thing in am and then another 2 puffs about 12 hours later.    

## 2021-05-13 NOTE — Telephone Encounter (Signed)
Called and spoke with pt who states that the cost of her Symbicort has gone up to about $100 and due to this, she cannot afford it.  Pt is wanting to know if there is something cheaper that may be more affordable for her.  Routing to [employer] as well as Dr. Melvyn Novas. Please advise.

## 2021-05-13 NOTE — Telephone Encounter (Signed)
I called the patient and she is agreeable to the change in medication and she is aware that it will be called into the pharmacy. Nothing further needed.

## 2021-05-19 NOTE — Progress Notes (Deleted)
3 MONTH FOLLOW UP  Assessment:    Essential hypertension - continue medications, DASH diet, exercise and monitor at home. Call if greater than 130/80.  - CBC with Differential/Platelet - CMP/GFR - TSH  Prediabetes Discussed general issues about diabetes pathophysiology and management., Educational material distributed., Suggested low cholesterol diet., Encouraged aerobic exercise., Discussed foot care., Reminded to get yearly retinal exam. - Hemoglobin A1c q71m   Mixed hyperlipidemia -? Off of statin due to elevated LFTs check lipids, decrease fatty foods, increase activity.  - Lipid panel  Coronary artery disease due to lipid rich plaque Control blood pressure, cholesterol, glucose, increase exercise.  - Lipid panel - Hemoglobin A1c  Recurrent major depressive disorder, in partial remission (HCC) Continue medications  Lifestyle discussed: diet/exerise, sleep hygiene, stress management, hydration  Atherosclerosis of aorta ([place]) Per numerous CTs Control blood pressure, cholesterol, glucose, increase exercise.    Asthma, unspecified asthma severity, uncomplicated Restart inhalers Go to the ER if any chest pain, shortness of breath, nausea, dizziness, severe HA, changes vision/speech  Chronic bronchitis (HCC) Continue inhalers, follows with pulm  Vertebral compression fracture, sequela Continue fosamax, continue Vit D and Ca, weight bearing exercises  Osteoporosis Continue fosamax, continue Vit D and Ca, weight bearing exercises   Herniated lumbar intervertebral disc Continue pain management  Vitamin D deficiency Continue supplement   MGUS (monoclonal gammopathy of unknown significance) - CBC with Differential/Platelet  Medication management - Magnesium  Chronic pain syndrome Continue pain management   Anxiety continue medications, stress management techniques discussed, increase water, good sleep hygiene discussed, increase exercise, and increase veggies.     Gastroesophageal reflux disease, esophagitis presence not specified Continue PPI/H2 blocker, diet discussed   ESOPHAGEAL STRICTURE Improved on last, follow up GI as recommended; continue PPI   Constipation, chronic Continue OTC agents, GI following  Spondylolisthesis, lumbar region Followed by spine specialist and pain management.   DDD (degenerative disc disease), lumbar Continue follow up pain management  Spinal stenosis, lumbar region, with neurogenic claudication Continue follow up pain management  History of lumbar spinal fusion Continue follow up pain management     Over 30 minutes of exam, counseling, chart review, and critical decision making was performed  Future Appointments  Date Time Provider [place]  05/21/2021 11:00 AM Magda Bernheim, NP [facility] None  07/22/2021 10:30 AM [name], NP [facility] None  08/17/2021  9:00 AM [name], RPH [facility] None  10/14/2021 10:15 AM [name], MD [facility] None  10/18/2021  9:00 AM [name], NP [facility] None     Plan:   During the course of the visit the patient was educated and counseled about appropriate screening and preventive services including:   Pneumococcal vaccine  Influenza vaccine Td vaccine Prevnar 13 Screening electrocardiogram Screening mammography Bone densitometry screening Colorectal cancer screening Diabetes screening Glaucoma screening Nutrition counseling  Advanced directives: given info/requested copies   Subjective:   [name] [name] is a 86 y.o. female who presents for 3 month follow up on hypertension, prediabetes, hyperlipidemia, vitamin D def.   Hx of mild intermittent asthma and chronic bronchitis, follows with Dr. Melvyn Novas. Prescribed advair but reports has been taking 1 puff of breztri (sample), only taking daily in recent weeks, was taking intermittently for a while. She had 3/3 covid 19 vaccines.   She reports started feeling "lousy" last week  7-8 days ago, started with intermittently productive cough (thick yellow, alternating with clear), some wheezing (late night and early morning) and chest congestion.   Denies CP, dyspnea,  fever/chills, sinus pressure, congestion, sore throat, HA, rash.   She denies allergies other than to dogs/cats. She has been taking breztri from sample that was given last visit. Covid 19 vaccinated. No known sick contacts. Husband is with her, hasn't had sx.   Patient has a chronic pain syndrome and is followed by Dr Nicholaus Bloom (pain management) and Dr Louanne Skye.  Hx of numerous lumbar surgeries, disc and fusions historically, most recently T8, T9 and T11 and T12 vertebral augmentation due to compression fractures. Recently hospitalized 08/13-24/2021 with pelvic acetabular Fx and is receiving non weightbearing home LPT under Dr.s Louanne Skye & Lorin Mercy  ecently saw Dr. Havery Moros for abdominal pain, negative workup, suspect r/t chronic msk etiology and advised to follow up with pain management. She is eating prunes and taking miralax for constipation, off of linzess and doing well.   Patient has hx/o Osteoporosis with T-2.6 at last DexaBMD on 11/2018 (improved from -2.8 in 2018) and is on Fosamax, followed by Dr. Estanislado Pandy.     She has history of MGUS with negative BX.   she has a diagnosis of anxiety and is currently on xanax 0.25-0.5 mg TID PRN, reports symptoms are well controlled on current regimen. she reports she last took 2-3 weeks ago, needs sporadically.   BMI is There is no height or weight on file to calculate BMI., she has not been working on diet and exercise. Her weight is up, no edema. She admits to eating more.  Wt Readings from Last 3 Encounters:  05/12/21 143 lb 6.4 oz (65 kg)  04/13/21 148 lb (67.1 kg)  03/03/21 149 lb (67.6 kg)   She also has history of PTCA/stent placed in 2003 and low risk myoview in 2014.  She has aortic atherosclerosis per numerous CTs, e.g 04/2019.  Her blood pressure has  been controlled at home, today their BP is    She does not workout due to chronic pain.   She is not on cholesterol medication (formerly on pravastatin 40 mg daily, reports was discontinued by another provider - ? GI due to elevated LFTs) and denies myalgias. Her cholesterol is at goal. The cholesterol last visit was:   Lab Results  Component Value Date   CHOL 146 02/18/2021   HDL 67 02/18/2021   LDLCALC 61 02/18/2021   TRIG 96 02/18/2021   CHOLHDL 2.2 02/18/2021    She has been working on diet and exercise for prediabetes (x 2010), and denies polydipsia, polyuria and visual disturbances. Last A1C in the office was:  Lab Results  Component Value Date   HGBA1C 5.7 (H) 02/18/2021   Last GFR  Lab Results  Component Value Date   GFRNONAA 61 10/15/2020   Patient is on Vitamin D supplement and at goal at recent check:  Lab Results  Component Value Date   VD25OH 98 04/13/2021      Medication Review Current Outpatient Medications on File Prior to Visit  Medication Sig Dispense Refill   albuterol (VENTOLIN HFA) 108 (90 Base) MCG/ACT inhaler INHALE 2 PUFFS BY MOUTH EVERY 6 HOURS AS NEEDED FOR WHEEZING FOR SHORTNESS OF BREATH 9 g 0   amLODipine (NORVASC) 10 MG tablet TAKE 1 TABLET BY MOUTH ONCE DAILY FOR BLOOD PRESSURE AND FOR HEART 90 tablet 3   aspirin EC 81 MG tablet Take 81 mg by mouth at bedtime. Every other night     azithromycin (ZITHROMAX) 250 MG tablet Take 2 on day one then 1 daily x 4 days 6 tablet 0  Cholecalciferol (VITAMIN D3) 5000 units CAPS Take 5,000 Units by mouth 2 (two) times daily.      cyclobenzaprine (FLEXERIL) 10 MG tablet Take  1 tablet  3 x /day  as needed for back pain /muscle spasm 90 tablet 0   denosumab (PROLIA) 60 MG/ML SOSY injection Inject 60 mg into the skin every 6 (six) months. 1 mL 0   diclofenac sodium (VOLTAREN) 1 % GEL Apply 2 g topically 4 (four) times daily. (Patient taking differently: Apply 2 g topically 4 (four) times daily as needed (pain).) 1  Tube 1   DULoxetine (CYMBALTA) 30 MG capsule TAKE 1 CAPSULE BY MOUTH ONCE DAILY, FOR MOOD AND CHRONIC PAIN 90 capsule 3   gabapentin (NEURONTIN) 400 MG capsule Take 400 mg by mouth 4 (four) times daily.     ketoconazole (NIZORAL) 2 % cream Apply 1 application topically 2 x /day (Patient taking differently: Apply 1 application topically 2 (two) times daily. Apply 1 application topically 2 x /day) 60 g 3   Magnesium 400 MG TABS Take 400 mg by mouth daily at 2 PM.     melatonin 5 MG TABS Take 5 mg by mouth at bedtime as needed.     metoprolol tartrate (LOPRESSOR) 50 MG tablet TAKE 1 TABLET BY MOUTH TWICE DAILY FOR BLOOD PRESSURE AND FOR HEART 180 tablet 1   mometasone-formoterol (DULERA) 100-5 MCG/ACT AERO Inhale 2 puffs into the lungs in the morning and at bedtime. 1 each 2   Multiple Vitamins-Minerals (MULTIVITAMIN WITH MINERALS) tablet Take 1 tablet by mouth daily.     nitroGLYCERIN (NITROSTAT) 0.4 MG SL tablet Place 1 tablet (0.4 mg total) under the tongue every 5 (five) minutes as needed for chest pain. 90 tablet 3   omeprazole (PRILOSEC) 40 MG capsule Take 40 mg by mouth in the morning and at bedtime. 30 capsule 0   predniSONE (DELTASONE) 10 MG tablet Take  4 each am x 2 days,   2 each am x 2 days,  1 each am x 2 days and stop 14 tablet 0   promethazine (PHENERGAN) 25 MG tablet Take  1 tablet  Every 4 hours ONLY if needed for Nausea & Vomiting                  /TAKE 1 TABLET BY MOUTH EVERY 4 HOURS AS NEEDED FOR NAUSEA AND VOMITING 50 tablet 0   senna-docusate (SENOKOT-S) 8.6-50 MG tablet Take 2 tablets by mouth 2 (two) times daily. 60 tablet 1   sucralfate (CARAFATE) 1 g tablet TAKE 1 TABLET BY MOUTH 4 TIMES DAILY BEFORE MEAL(S) AND AT BEDTIME FOR INDIGESTION AND FOR HEARTBURN 360 tablet 3   trospium (SANCTURA) 20 MG tablet Take 20 mg by mouth 2 (two) times daily.     vitamin B-12 (CYANOCOBALAMIN) 1000 MCG tablet Take 5,000 mcg by mouth daily.     Current Facility-Administered Medications on  File Prior to Visit  Medication Dose Route Frequency Provider Last Rate Last Admin   calcitonin (salmon) (MIACALCIN/FORTICAL) nasal spray 1 spray  1 spray Alternating Nares Daily Jessy Oto, MD        Current Problems (verified) Patient Active Problem List   Diagnosis Date Noted   Iron deficiency 10/16/2020   History of adenomatous polyp of colon 07/21/2020   Atypical chest pain 07/08/2020   History of fundoplication    Epigastric pain 07/09/2019   Senile purpura ([place]) 07/05/2019   Abnormal glucose (prediabetes) 03/01/2018   FHx: heart disease 03/01/2018  CKD (chronic kidney disease) stage 2, GFR 60-89 ml/min 09/27/2017   T12 compression fracture ([place]) 09/26/2017   Chronic bronchitis ([place]) 08/09/2017   Atherosclerosis of aorta ([place]) by Lumbar CYT scan 01/29/2021 04/05/2017   Odynophagia    Spinal stenosis, lumbar region, with neurogenic claudication 05/29/2015   DDD (degenerative disc disease), lumbar 04/21/2015   Hyperlipidemia, mixed 11/24/2014   Gastroesophageal reflux disease 11/24/2014   Depression, major, in partial remission ([place]) 09/26/2014   Chronic pain syndrome 09/26/2014   Coronary artery disease involving native heart without angina pectoris    Osteoporosis 02/10/2014   Vitamin D deficiency 08/14/2013   MGUS (monoclonal gammopathy of unknown significance) 03/05/2013   ESOPHAGEAL STRICTURE 08/28/2008   Mild intermittent asthma without complication 85/63/1497   Incarcerated recurrent paraesophageal hiatal hernia s/p lap redo repair [id number] 01/31/2008   Essential hypertension 07/25/2007    Screening Tests Immunization History  Administered Date(s) Administered   DT (Pediatric) 05/06/2014   Influenza Split 02/07/2011   Influenza Whole 02/09/2009, 01/19/2010, 02/13/2012   Influenza, High Dose Seasonal PF 03/22/2013, 01/15/2014, 02/27/2015, 01/14/2016, 01/18/2017, 02/14/2018, 02/14/2019, 03/16/2020, 02/05/2021   PFIZER(Purple Top)SARS-COV-2 Vaccination  05/25/2019, 06/15/2019, 03/24/2020   Pneumococcal Conjugate-13 05/06/2014   Pneumococcal Polysaccharide-23 02/09/2011   Pneumococcal-Unspecified 02/06/2010, 02/09/2012   Td 05/10/2003    Allergies Allergies  Allergen Reactions   Ace Inhibitors Cough    Per Dr Melvyn Novas pulmonology 2013   Aspartame Diarrhea and Nausea And Vomiting   Atorvastatin Other (See Comments)    Muscle aches   Biaxin [Clarithromycin] Other (See Comments)    Unknown reaction : PATIENT CAN TOLERATE Z-PAK.  Is written on patient's paper chart.   Daliresp [Roflumilast] Nausea And Vomiting   Erythromycin Other (See Comments)    Unknown reaction: PATIENT CAN TOLERATE Z-PAK.  It is written on patient's paper chart.   Levofloxacin Nausea And Vomiting    SURGICAL HISTORY She  has a past surgical history that includes Back surgery; Bladder repair; Hemorrhoid surgery; Cholecystectomy; Appendectomy; Total abdominal hysterectomy (1975); Eye surgery (11/2000); Hiatal hernia repair (06/03/2011); Laparoscopic Nissen fundoplication (8/85/0277); Lumbar laminectomy/decompression microdiscectomy (Right, 06/26/2012); Esophagogastroduodenoscopy (N/A, 03/15/2013); Esophageal manometry (N/A, 03/25/2013); Hiatal hernia repair (N/A, 04/24/2013); Insertion of mesh (N/A, 04/24/2013); Laparoscopic lysis of adhesions (N/A, 04/24/2013); Fixation kyphoplasty lumbar spine (X 2); Coronary angioplasty (11/16/2001); Lumbar laminectomy/decompression microdiscectomy (N/A, 05/29/2015); Lumbar fusion (12/07/2015); Esophagogastroduodenoscopy (N/A, 12/25/2015); IR KYPHO THORACIC WITH BONE BIOPSY (09/29/2017); IR KYPHO THORACIC WITH BONE BIOPSY (11/03/2017); Upper gastrointestinal endoscopy; IR KYPHO THORACIC WITH BONE BIOPSY (02/14/2019); and IR KYPHO THORACIC WITH BONE BIOPSY (04/02/2019). FAMILY HISTORY Her family history includes Brain cancer in her brother; Colon cancer (age of onset: 63) in her mother; Heart attack (age of onset: 61) in her father; Heart disease in  her brother and brother; Kidney disease in her daughter. SOCIAL HISTORY She  reports that she has never smoked. She has never used smokeless tobacco. She reports that she does not drink alcohol and does not use drugs.  Review of Systems  Constitutional:  Negative for malaise/fatigue and weight loss.  HENT:  Negative for hearing loss and tinnitus.   Eyes:  Negative for blurred vision and double vision.  Respiratory:  Negative for cough, shortness of breath and wheezing.   Cardiovascular:  Negative for chest pain, palpitations, orthopnea, claudication and leg swelling.  Gastrointestinal:  Negative for abdominal pain, blood in stool, constipation, diarrhea, heartburn, melena, nausea and vomiting.  Genitourinary: Negative.   Musculoskeletal:  Negative for joint pain and myalgias.  Skin:  Negative for rash.  Neurological:  Negative for dizziness, tingling, sensory change, weakness and headaches.  Endo/Heme/Allergies:  Negative for polydipsia.  Psychiatric/Behavioral: Negative.    All other systems reviewed and are negative.   Objective:   There were no vitals filed for this visit.  There is no height or weight on file to calculate BMI.  General appearance: alert, no distress, WD/WN,  female HEENT: normocephalic, sclerae anicteric, TMs pearly, nares patent, no discharge or erythema, pharynx normal Oral cavity: MMM, no lesions Neck: supple, no lymphadenopathy, no thyromegaly, no masses Heart: RRR, normal S1, S2, no murmurs Lungs: Mildly diminished throughout, mild end expiratory wheezing, no rhonchi or stridor  Abdomen: +bs, rounded, non tender, no palpable masses, no hepatomegaly, no splenomegaly Musculoskeletal: nontender, no swelling, no obvious deformity Extremities: no edema, no cyanosis, no clubbing Pulses: 2+ symmetric, upper and lower extremities, normal cap refill Neurological: alert, oriented x 3, CN2-12 intact, strength 4/5 upper extremities and lower extremities, DTRs 2+  throughout, no cerebellar signs, gait antaglic with cane  Psychiatric: normal affect, behavior normal, pleasant    Magda Bernheim, NP   05/19/2021

## 2021-05-20 ENCOUNTER — Telehealth: Payer: Self-pay | Admitting: Internal Medicine

## 2021-05-20 NOTE — Telephone Encounter (Signed)
Called patient but she did not answer. Left message for her to call back.  

## 2021-05-21 ENCOUNTER — Other Ambulatory Visit (HOSPITAL_COMMUNITY): Payer: Self-pay

## 2021-05-21 ENCOUNTER — Ambulatory Visit: Payer: PPO | Admitting: Nurse Practitioner

## 2021-05-21 DIAGNOSIS — M810 Age-related osteoporosis without current pathological fracture: Secondary | ICD-10-CM

## 2021-05-21 DIAGNOSIS — I700 Atherosclerosis of aorta: Secondary | ICD-10-CM

## 2021-05-21 DIAGNOSIS — J42 Unspecified chronic bronchitis: Secondary | ICD-10-CM

## 2021-05-21 DIAGNOSIS — I10 Essential (primary) hypertension: Secondary | ICD-10-CM

## 2021-05-21 DIAGNOSIS — K219 Gastro-esophageal reflux disease without esophagitis: Secondary | ICD-10-CM

## 2021-05-21 DIAGNOSIS — G894 Chronic pain syndrome: Secondary | ICD-10-CM

## 2021-05-21 DIAGNOSIS — I2510 Atherosclerotic heart disease of native coronary artery without angina pectoris: Secondary | ICD-10-CM

## 2021-05-21 DIAGNOSIS — M4316 Spondylolisthesis, lumbar region: Secondary | ICD-10-CM

## 2021-05-21 DIAGNOSIS — K222 Esophageal obstruction: Secondary | ICD-10-CM

## 2021-05-21 DIAGNOSIS — F3341 Major depressive disorder, recurrent, in partial remission: Secondary | ICD-10-CM

## 2021-05-21 DIAGNOSIS — E782 Mixed hyperlipidemia: Secondary | ICD-10-CM

## 2021-05-21 DIAGNOSIS — R7309 Other abnormal glucose: Secondary | ICD-10-CM

## 2021-05-21 DIAGNOSIS — F419 Anxiety disorder, unspecified: Secondary | ICD-10-CM

## 2021-05-21 DIAGNOSIS — S22080S Wedge compression fracture of T11-T12 vertebra, sequela: Secondary | ICD-10-CM

## 2021-05-21 DIAGNOSIS — K5909 Other constipation: Secondary | ICD-10-CM

## 2021-05-21 DIAGNOSIS — D472 Monoclonal gammopathy: Secondary | ICD-10-CM

## 2021-05-21 DIAGNOSIS — E559 Vitamin D deficiency, unspecified: Secondary | ICD-10-CM

## 2021-05-21 DIAGNOSIS — Z981 Arthrodesis status: Secondary | ICD-10-CM

## 2021-05-21 DIAGNOSIS — M48062 Spinal stenosis, lumbar region with neurogenic claudication: Secondary | ICD-10-CM

## 2021-05-21 MED ORDER — PREDNISONE 10 MG PO TABS: ORAL_TABLET | ORAL | 0 refills | Status: DC

## 2021-05-21 MED ORDER — FLUTICASONE FUROATE-VILANTEROL 100-25 MCG/ACT IN AEPB: 1.0000 | INHALATION_SPRAY | Freq: Every day | RESPIRATORY_TRACT | 11 refills | Status: DC

## 2021-05-21 NOTE — Addendum Note (Signed)
Addended by: Rosana Berger on: 05/21/2021 05:12 PM   Modules accepted: Orders

## 2021-05-21 NOTE — Telephone Encounter (Signed)
[name], MD to Me      4:56 PM  She can go with Breo 100 1 puff each am  though this is a powder and may make a cough worse   Spoke with the pt and notified of response per MW  She verbalized understanding  Rx sent

## 2021-05-21 NOTE — Telephone Encounter (Signed)
We can send in another round prednisone. Should take mucinex 1200mg  twice daily. Resume dulera two puffs twice daily. Follow-up next week in office with CXR if not better

## 2021-05-21 NOTE — Telephone Encounter (Signed)
Spoke with the pt and notified of response per [facility]  She verbalized understanding  Nothing further needed

## 2021-05-21 NOTE — Telephone Encounter (Signed)
Unable to provide a co-pay amount due to Symbicort being billed at the pharmacy already. However that is the same class of medication and the test claim return duplicate therapy. Meaning it's likely it will be the same copay as the Symbicort.

## 2021-05-21 NOTE — Telephone Encounter (Signed)
Spoke with the pt  She states still coughing up green sputum despite finishing her zpack and pred  She just ran out of [facility]  She is taking her albuterol about 2 x per day  She says not feeling SOB or wheezing  She just c/o continued cough and hoarseness  No fevers, aches   Beth, she is asking for more prednisone. Please advise thanks!  Allergies  Allergen Reactions   Ace Inhibitors Cough    Per Dr Melvyn Novas pulmonology 2013   Aspartame Diarrhea and Nausea And Vomiting   Atorvastatin Other (See Comments)    Muscle aches   Biaxin [Clarithromycin] Other (See Comments)    Unknown reaction : PATIENT CAN TOLERATE Z-PAK.  Is written on patient's paper chart.   Daliresp [Roflumilast] Nausea And Vomiting   Erythromycin Other (See Comments)    Unknown reaction: PATIENT CAN TOLERATE Z-PAK.  It is written on patient's paper chart.   Levofloxacin Nausea And Vomiting

## 2021-05-21 NOTE — Telephone Encounter (Signed)
Spoke with the pt  She states that the [facility] was $100 at the pharmacy  She checked with her insurance and was advised that  breo and Wixela inhub are more affordable  Please advise if okay to change to one of these, thanks!

## 2021-06-03 ENCOUNTER — Telehealth: Payer: Self-pay

## 2021-06-03 NOTE — Telephone Encounter (Signed)
Called patient today to do a fall risk assessment. Unable to reach patient. [place]  Total time spent; 3 minutes Vanetta Shawl, [facility]

## 2021-06-07 ENCOUNTER — Telehealth: Payer: Self-pay

## 2021-06-18 ENCOUNTER — Other Ambulatory Visit: Payer: Self-pay

## 2021-06-18 DIAGNOSIS — K219 Gastro-esophageal reflux disease without esophagitis: Secondary | ICD-10-CM

## 2021-06-18 DIAGNOSIS — Z79899 Other long term (current) drug therapy: Secondary | ICD-10-CM

## 2021-06-18 MED ORDER — SUCRALFATE 1 G PO TABS: ORAL_TABLET | ORAL | 3 refills | Status: DC

## 2021-06-25 ENCOUNTER — Other Ambulatory Visit: Payer: Self-pay | Admitting: Internal Medicine

## 2021-06-25 DIAGNOSIS — B372 Candidiasis of skin and nail: Secondary | ICD-10-CM

## 2021-07-05 ENCOUNTER — Encounter: Payer: Self-pay | Admitting: Internal Medicine

## 2021-07-05 ENCOUNTER — Ambulatory Visit (INDEPENDENT_AMBULATORY_CARE_PROVIDER_SITE_OTHER): Payer: PPO | Admitting: Internal Medicine

## 2021-07-05 ENCOUNTER — Other Ambulatory Visit: Payer: Self-pay

## 2021-07-05 VITALS — BP 124/76 | HR 77 | Temp 97.9°F | Resp 18 | Ht 64.0 in | Wt 147.2 lb

## 2021-07-05 DIAGNOSIS — J4521 Mild intermittent asthma with (acute) exacerbation: Secondary | ICD-10-CM | POA: Diagnosis not present

## 2021-07-05 DIAGNOSIS — J0140 Acute pansinusitis, unspecified: Secondary | ICD-10-CM | POA: Diagnosis not present

## 2021-07-05 DIAGNOSIS — R110 Nausea: Secondary | ICD-10-CM

## 2021-07-05 MED ORDER — PSEUDOEPHEDRINE HCL ER 120 MG PO TB12: ORAL_TABLET | ORAL | 3 refills | Status: DC

## 2021-07-05 MED ORDER — AZITHROMYCIN 250 MG PO TABS: ORAL_TABLET | ORAL | 1 refills | Status: DC

## 2021-07-05 MED ORDER — PROMETHAZINE HCL 25 MG PO TABS: ORAL_TABLET | ORAL | 0 refills | Status: DC

## 2021-07-05 MED ORDER — DEXAMETHASONE 4 MG PO TABS: ORAL_TABLET | ORAL | 0 refills | Status: DC

## 2021-07-05 NOTE — Telephone Encounter (Signed)
LM-27/23 Pharmacist Review (CL) Review Adherence gaps identified?: No Drug Therapy Problems identified?: No Assessment: Controlled Other notes: Will continue to monitor pt fall risk    [name] [name] 07/05/2021 at 10:59 AM CPP Assessment Review: 5 min   Fall Risk Review [facility])  Chart Review What recent interventions/DTPs have been made by any provider to improve the patient's conditions in the last 3 months?: 02/18/21-Dr. Melford Aase (PCP)-Pt. presented for 6 month follow up with HTN, HLD, Pre-Diabetes and Vitamin D Deficiency.   STOPPED Benzonatate 100 mg Oral 3 times daily PRN Dexamethasone 1 MG Take 3 tabs for 3 days, 2 tabs for 3 days 1 tab for 5 days. Take with food. Promethazine-DM 6.25-15 MG/5ML 5 mLs Oral 4 times daily PRN 03/03/21-Nitka, Daleen Bo, MD (Orthopedic Surgery)-Pt. presented w/ Orthopedic Surgery Spinal stenosis of lumbosacral region. No medication changes noted.  04/13/21-Dale, Geni Bers, PA-C (Rheumatology)-Pt. presented w/ history of osteoporosis and osteoarthritis. She presented to update lab work and discuss proceeding with her next prolia injection. No medication changes noted.  04/15/21-Phillips, Mark (Pain Medicine)-No information available.  05/12/21-Wert, Christena Deem, MD (Pulmonary Disease)-Pt. presented with large hiatal hernia & recurrent asthmatic bronchitis.  STARTED Azithromycin 250 MG Take 2 on day one then 1 daily x 4 days prednisone 10 MG Take  4 each am x 2 days,   2 each am x 2 days,  1 each am x 2 days and stop MODIFIED Gabapentin 400mg  4x daily  STOPPED Fluticasone-Salmeterol 500-50 MCG/ACT 1 puff Inhalation 2 times daily Meclizine HCl 25 MG Take 1/2 to 1 tablet 2 to 3 x /day ONLY if needed  For Dizziness / Vertigo  ropinirole HCl 2 MG Take  1 tablet  3 x /day  for Restless Legs Any recent hospitalizations or ED visits since last visit with CPP?: No  Adherence Review Adherence rates for STAR metric medications: Amlodipine  10mg -90DS-11/20/20 Metoprolol 50mg -90DS-05/22/20 Adherence rates for medications indicated for disease state being reviewed: Amlodipine 10mg -90DS-11/20/20 Metoprolol 50mg -90DS-05/22/20 Does the patient have >5 day gap between last estimated fill dates for any of the above medications?: Yes Reasons for medication gaps: Medication not updated in EMR.  Disease State Questions Able to connect with the Patient?: Yes In the past 12 months, have you fallen?: No Are there any stairs in or around the home?: No Is the home free of loose throw rugs in walkways, pet beds, electrical cords, etc.?: No Is there adequate lighting in your home to reduce the risk of falls?: Yes Misc. Response/Information:: [place]. LM-06/03/21    [name] [name] 03/18/2021 at 11:37 AM Edited HC Follow-up: Marybelle Killings)  -December (after 12/9) Fall assessment  CPP Assessment Review: 5 min HC time spent: 15 Minutes

## 2021-07-05 NOTE — Progress Notes (Signed)
Future Appointments  Date Time Provider Department  07/05/2021  4:00 PM [name], MD [facility]  07/22/2021 10:30 AM [name], NP [facility]  08/17/2021  9:00 AM [name], [facility]  10/14/2021 10:15 AM [name], MD [facility]  10/18/2021  9:00 AM [name], NP [facility]    History of Present Illness:     Patient is a very nice 86 yo MWF presenting with 1 week prodrome of head/ sinus & chest congestion with congestion. Reports sputum & sinus drainage is yellowish-green . Denies SOB. Has chronic c/o nausea.  Medications    denosumab (PROLIA) 60 MG/ML SOSY injection, Inject 60 mg into the skin every 6 (six) months.   calcitonin (salmon) (MIACALCIN/FORTICAL) nasal spray 1 spray   amLODipine (NORVASC) 10 MG tablet, TAKE 1 TABLET ONCE DAILY FOR BLOOD PRESSURE AND FOR HEART   metoprolol tartrate (LOPRESSOR) 50 MG tablet, TAKE 1 TABLET  TWICE DAILY FOR BLOOD PRESSURE AND FOR HEART   nitroGLYCERIN (NITROSTAT) 0.4 MG SL tablet,  as needed for chest pain.   albuterol HFA inhaler, INHALE 2 PUFFS  EVERY 6 HOURS AS NEEDED FOR WHEEZING FOR SHORTNESS OF BREATH   BREO ELLIPTA) 100-25, Inhale 1 puff daily.   aspirin EC 81 MG tablet, Take at bedtime. Every other night   vitamin B-12  1000 MCG tablet, Take 5,000 mcg by mouth daily.   VITAMIN D 5000 u, Take  (two) times daily.    cyclobenzaprine  10 MG tablet, Take  1 tablet  3 x /day  as needed for back pain /muscle spasm   diclofenac  1 % GEL, Apply 2 g topically 4  times daily.   DULoxetine 30 MG capsule, TAKE 1 CAPSULE  DAILY, FOR MOOD AND CHRONIC PAIN   gabapentin (NEURONTIN) 400 MG capsule, Take 400 mg by mouth 4 (four) times daily.   ketoconazole (NIZORAL) 2 % cream, APPLY ONE APPLICATION TOPICALLY TWICE DAILY   Magnesium 400 MG TABS, Take daily at 2 PM.   melatonin 5 MG TABS, Take  at bedtime as needed.   Multiple Vitamins-Minerals tablet, Take 1 tablet daily.   omeprazole  40 MG capsule, Take 40 mg  in the  morning and at bedtime.   SENOKOT-S, Take 2 tablets 2 (two) times daily.   sucralfate (CARAFATE) 1 g tablet, TAKE 1 TABLET \\4  TIMES DAILY   trospium  20 MG tablet, Take  2 times daily.   Problem list She has Essential hypertension; Mild intermittent asthma without complication; ESOPHAGEAL STRICTURE; Incarcerated recurrent paraesophageal hiatal hernia s/p lap redo repair [id number]; MGUS (monoclonal gammopathy of unknown significance); Vitamin D deficiency; Osteoporosis; Coronary artery disease involving native heart without angina pectoris; Depression, major, in partial remission ([place]); Chronic pain syndrome; Hyperlipidemia, mixed; Gastroesophageal reflux disease; DDD (degenerative disc disease), lumbar; Spinal stenosis, lumbar region, with neurogenic claudication; Odynophagia; Atherosclerosis of aorta ([place]) by Lumbar CYT scan 01/29/2021; Chronic bronchitis (HCC); T12 compression fracture (HCC); CKD (chronic kidney disease) stage 2, GFR 60-89 ml/min; Abnormal glucose (prediabetes); FHx: heart disease; Senile purpura ([place]); Epigastric pain; History of fundoplication; Atypical chest pain; History of adenomatous polyp of colon; and Iron deficiency on their problem list.   Observations/Objective:  BP 124/76    Pulse 77    Temp 97.9 F (36.6 C)    Resp 18    Ht 5\' 4"  (1.626 m)    Wt 147 lb 3.2 oz (66.8 kg)    SpO2 94%    BMI 25.27 kg/m   Congested wheezy cough.  HEENT - EACs clear /TMs Nl. (+) frontal/maxillary tenderness. N/O/P clear.  Neck - supple.  Chest - Bilat ins rales & exp rhonchi & coarse wheezes.  Cor - Nl HS. RRR w/o sig MGR. PP 1(+). No edema. MS- FROM w/o deformities.  Gait Nl. Neuro -  Nl w/o focal abnormalities.   Assessment and Plan:   1. Chronic nausea  - promethazine 25 MG tablet;  Take  1 tablet  4 x/day  if needed for Nausea & Vomiting   Dispense: 120 tablet; Refill: 0  2. Subacute pansinusitis  - dexamethasone 4 MG tablet;  Take 1 tab 3 x day - 3 days, then 2 x day  - 3 days, then 1 tab daily   Dispense: 20 tablet  - pseudoephedrine 120 MG 12 hr tablet;  Take  1 tablet  2 x /day (every 12 hours)   Dispense: 60 tablet; Refill: 3  - azithromycin 250 MG tablet;  Take 2 tablets with Food on  Day 1, then 1 tablet Daily    Dispense: 6 each; Refill: 1  3. Mild intermittent asthmatic bronchitis with acute exacerbation  Given 14 day sx of Trelegy 200 to try for 2 weeks   And also Breztri  ( & spacer )  also 2 week  supply to try after the Trelegy   Advised not to use her Memory Dance while taking Trelegy of Breztri.   - instructed in use of the different inhalers.  Follow Up Instructions:       I discussed the assessment and treatment plan with the patient. The patient was provided an opportunity to ask questions and all were answered. The patient agreed with the plan and demonstrated an understanding of the instructions.        The patient was advised to call back or seek an in-person evaluation if the symptoms worsen or if the condition fails to improve as anticipated.   Kirtland Bouchard, MD

## 2021-07-09 DIAGNOSIS — N3946 Mixed incontinence: Secondary | ICD-10-CM | POA: Diagnosis not present

## 2021-07-09 DIAGNOSIS — R351 Nocturia: Secondary | ICD-10-CM | POA: Diagnosis not present

## 2021-07-09 DIAGNOSIS — R3914 Feeling of incomplete bladder emptying: Secondary | ICD-10-CM | POA: Diagnosis not present

## 2021-07-15 DIAGNOSIS — H43813 Vitreous degeneration, bilateral: Secondary | ICD-10-CM | POA: Diagnosis not present

## 2021-07-15 DIAGNOSIS — Z961 Presence of intraocular lens: Secondary | ICD-10-CM | POA: Diagnosis not present

## 2021-07-15 DIAGNOSIS — H26493 Other secondary cataract, bilateral: Secondary | ICD-10-CM | POA: Diagnosis not present

## 2021-07-15 DIAGNOSIS — H5203 Hypermetropia, bilateral: Secondary | ICD-10-CM | POA: Diagnosis not present

## 2021-07-22 ENCOUNTER — Ambulatory Visit (INDEPENDENT_AMBULATORY_CARE_PROVIDER_SITE_OTHER): Payer: PPO | Admitting: Adult Health

## 2021-07-22 ENCOUNTER — Other Ambulatory Visit: Payer: Self-pay

## 2021-07-22 ENCOUNTER — Encounter: Payer: Self-pay | Admitting: Adult Health

## 2021-07-22 VITALS — BP 110/60 | HR 66 | Temp 97.3°F | Wt 151.8 lb

## 2021-07-22 DIAGNOSIS — K219 Gastro-esophageal reflux disease without esophagitis: Secondary | ICD-10-CM | POA: Diagnosis not present

## 2021-07-22 DIAGNOSIS — R6889 Other general symptoms and signs: Secondary | ICD-10-CM | POA: Diagnosis not present

## 2021-07-22 DIAGNOSIS — N182 Chronic kidney disease, stage 2 (mild): Secondary | ICD-10-CM

## 2021-07-22 DIAGNOSIS — I700 Atherosclerosis of aorta: Secondary | ICD-10-CM

## 2021-07-22 DIAGNOSIS — G894 Chronic pain syndrome: Secondary | ICD-10-CM | POA: Diagnosis not present

## 2021-07-22 DIAGNOSIS — E559 Vitamin D deficiency, unspecified: Secondary | ICD-10-CM

## 2021-07-22 DIAGNOSIS — D692 Other nonthrombocytopenic purpura: Secondary | ICD-10-CM

## 2021-07-22 DIAGNOSIS — F3341 Major depressive disorder, recurrent, in partial remission: Secondary | ICD-10-CM

## 2021-07-22 DIAGNOSIS — J42 Unspecified chronic bronchitis: Secondary | ICD-10-CM

## 2021-07-22 DIAGNOSIS — E782 Mixed hyperlipidemia: Secondary | ICD-10-CM | POA: Diagnosis not present

## 2021-07-22 DIAGNOSIS — J4520 Mild intermittent asthma, uncomplicated: Secondary | ICD-10-CM

## 2021-07-22 DIAGNOSIS — I10 Essential (primary) hypertension: Secondary | ICD-10-CM | POA: Diagnosis not present

## 2021-07-22 DIAGNOSIS — M810 Age-related osteoporosis without current pathological fracture: Secondary | ICD-10-CM | POA: Diagnosis not present

## 2021-07-22 DIAGNOSIS — Z0001 Encounter for general adult medical examination with abnormal findings: Secondary | ICD-10-CM

## 2021-07-22 DIAGNOSIS — I2510 Atherosclerotic heart disease of native coronary artery without angina pectoris: Secondary | ICD-10-CM | POA: Diagnosis not present

## 2021-07-22 NOTE — Patient Instructions (Addendum)
? ?PLEASE SCHEDULE YOUR BONE DENSITY EXAM  ? ?[facility]  ?7 a.m.-6:30 p.m., Monday ?7 a.m.-5 p.m., Tuesday-Friday ?Schedule an appointment by calling [phone]. ? ? ? ?Please start checking blood pressure - keep a log - call office if frequently <115/60 ? ? ?Try omeprazole just once daily after you stop coffee and see how you do  ? ?Recommend stopping sudafed unless you have ongoing nasal congestion  ? ? ? ?High-Fiber Eating Plan ?Fiber, also called dietary fiber, is a type of carbohydrate. It is found foods such as fruits, vegetables, whole grains, and beans. A high-fiber diet can have many health benefits. Your health care provider may recommend a high-fiber diet to help: ?Prevent constipation. Fiber can make your bowel movements more regular. ?Lower your cholesterol. ?Relieve the following conditions: ?Inflammation of veins in the anus (hemorrhoids). ?Inflammation of specific areas of the digestive tract (uncomplicated diverticulosis). ?A problem of the large intestine, also called the colon, that sometimes causes pain and diarrhea (irritable bowel syndrome, or IBS). ?Prevent overeating as part of a weight-loss plan. ?Prevent heart disease, type 2 diabetes, and certain cancers. ?What are tips for following this plan? ?Reading food labels ? ?Check the nutrition facts label on food products for the amount of dietary fiber. Choose foods that have 5 grams of fiber or more per serving. ?The goals for recommended daily fiber intake include: ?Men (age 63 or younger): 34-38 g. ?Men (over age 35): 28-34 g. ?Women (age 19 or younger): 25-28 g. ?Women (over age 18): 22-25 g. ?Your daily fiber goal is _____________ g. ?Shopping ?Choose whole fruits and vegetables instead of processed forms, such as apple juice or applesauce. ?Choose a wide variety of high-fiber foods such as avocados, lentils, oats, and kidney beans. ?Read the nutrition facts label of the foods you choose. Be aware of  foods with added fiber. These foods often have high sugar and sodium amounts per serving. ?Cooking ?Use whole-grain flour for baking and cooking. ?Cook with brown rice instead of white rice. ?Meal planning ?Start the day with a breakfast that is high in fiber, such as a cereal that contains 5 g of fiber or more per serving. ?Eat breads and cereals that are made with whole-grain flour instead of refined flour or white flour. ?Eat brown rice, bulgur wheat, or millet instead of white rice. ?Use beans in place of meat in soups, salads, and pasta dishes. ?Be sure that half of the grains you eat each day are whole grains. ?General information ?You can get the recommended daily intake of dietary fiber by: ?Eating a variety of fruits, vegetables, grains, nuts, and beans. ?Taking a fiber supplement if you are not able to take in enough fiber in your diet. It is better to get fiber through food than from a supplement. ?Gradually increase how much fiber you consume. If you increase your intake of dietary fiber too quickly, you may have bloating, cramping, or gas. ?Drink plenty of water to help you digest fiber. ?Choose high-fiber snacks, such as berries, raw vegetables, nuts, and popcorn. ?What foods should I eat? ?Fruits ?Berries. Pears. Apples. Oranges. Avocado. Prunes and raisins. Dried figs. ?Vegetables ?Sweet potatoes. Spinach. Kale. Artichokes. Cabbage. Broccoli. Cauliflower. Green peas. Carrots. Squash. ?Grains ?Whole-grain breads. Multigrain cereal. Oats and oatmeal. Brown rice. Barley. Bulgur wheat. [place]. Quinoa. Bran muffins. Popcorn. Rye wafer crackers. ?Meats and other proteins ?Navy beans, kidney beans, and pinto beans. Soybeans. Split peas. Lentils. Nuts and seeds. ?Dairy ?Fiber-fortified yogurt. ?Beverages ?Fiber-fortified  soy milk. Fiber-fortified orange juice. ?Other foods ?Fiber bars. ?The items listed above may not be a complete list of recommended foods and beverages. Contact a dietitian for more  information. ?What foods should I avoid? ?Fruits ?Fruit juice. Cooked, strained fruit. ?Vegetables ?Fried potatoes. Canned vegetables. Well-cooked vegetables. ?Grains ?White bread. Pasta made with refined flour. White rice. ?Meats and other proteins ?Fatty cuts of meat. Fried chicken or fried fish. ?Dairy ?Milk. Yogurt. Cream cheese. Sour cream. ?Fats and oils ?Butters. ?Beverages ?Soft drinks. ?Other foods ?Cakes and pastries. ?The items listed above may not be a complete list of foods and beverages to avoid. Talk with your dietitian about what choices are best for you. ?Summary ?Fiber is a type of carbohydrate. It is found in foods such as fruits, vegetables, whole grains, and beans. ?A high-fiber diet has many benefits. It can help to prevent constipation, lower blood cholesterol, aid weight loss, and reduce your risk of heart disease, diabetes, and certain cancers. ?Increase your intake of fiber gradually. Increasing fiber too quickly may cause cramping, bloating, and gas. Drink plenty of water while you increase the amount of fiber you consume. ?The best sources of fiber include whole fruits and vegetables, whole grains, nuts, seeds, and beans. ?This information is not intended to replace advice given to you by your health care provider. Make sure you discuss any questions you have with your health care provider. ?Document Revised: 08/29/2019 Document Reviewed: 08/29/2019 ?Elsevier Patient Education ? [place]. ? ?

## 2021-07-22 NOTE — Progress Notes (Signed)
AWV AND FOLLOW UP ? ?Assessment:  ? ?Annual Medicare Wellness Visit ?Due annually  ?Health maintenance reviewed ? ?Schedule DEXA, phone number given  ? ?Atherosclerosis of aorta ([place]) ?Per numerous CTs ?Control blood pressure, cholesterol, glucose, increase exercise.  ? ?Essential hypertension ?- continue medications,  ?- DASH diet, exercise and monitor at home. Call if greater than 130/80.  ?- CBC with Differential/Platelet ?- CMP/GFR ? ?Prediabetes ?Discussed general issues about diabetes pathophysiology and management., Educational material distributed., Suggested low cholesterol diet., Encouraged aerobic exercise., Discussed foot care., Reminded to get yearly retinal exam. ?- Hemoglobin A1c q73m? ? Mixed hyperlipidemia ?-continue medications, check lipids, decrease fatty foods, increase activity.  ?- Lipid panel ?- TSH ? ?Coronary artery disease due to lipid rich plaque ?Control blood pressure, cholesterol, glucose, increase exercise. ?Statin for LDL goal <70  ?- Lipid panel ? ?Recurrent major depressive disorder, in partial remission ([place] ?Continue medications  ?Lifestyle discussed: diet/exerise, sleep hygiene, stress management, hydration ? ?Incarcerated recurrent paraesophageal hiatal hernia s/p lap redo repair [id number]?Improved, continue PPI ? ? Asthma, unspecified asthma severity, uncomplicated ?Continue inhalers ?Go to the ER if any chest pain, shortness of breath, nausea, dizziness, severe HA, changes vision/speech ? ?[place]itis ([place] ?Continue inhalers, follows with pulm ?- may need allergy med, try adding mucinex ?- follow up pulm if not improving ? ?Osteoporosis ?Continue prolia via Dr. DEstanislado Pandy continue Vit D and Ca, weight bearing exercises ?- schedule DEXA ? ? Herniated lumbar intervertebral disc ?Continue pain management ? ?Vitamin D deficiency ?- Vit D  25 hydroxy (rtn osteoporosis monitoring) ? ?MGUS (monoclonal gammopathy of unknown significance) ?Continue to monitor; had negative  biopsy  ?- CMP/GFR ? ?Medication management ?- Magnesium ? ?Chronic pain syndrome ?Continue pain management ? ? Anxiety ?continue medications, stress management techniques discussed, increase water, good sleep hygiene discussed, increase exercise, and increase veggies.  ?- TSH ? ? Gastroesophageal reflux disease, esophagitis presence not specified ?Continue PPI/H2 blocker, diet discussed ?- CMP/GFR ?- Magnesium  ? ? ESOPHAGEAL STRICTURE ?Improved on last, follow up GI as recommended; continue PPI ? ? Constipation, chronic ?Continue on senokot, GI following ? ?Spondylolisthesis, lumbar region ?Followed by spine specialist and pain management.  ? ?DDD (degenerative disc disease), lumbar ?Continue follow up pain management ? ?Spinal stenosis, lumbar region, with neurogenic claudication ?Continue follow up pain management ? ?History of lumbar spinal fusion ?Continue follow up pain management ? ?Senile purpura ([place] ?Secondary to age and aspirin; reassured; protect skin ? ? ? ?ADVISED TO REVIEW MED LIST AND CALL BACK WITH ANY NO LONGER TAKING ? ?Orders Placed This Encounter  ?Procedures  ? DG Bone Density  ? CBC with Differential/Platelet  ? COMPLETE METABOLIC PANEL WITH GFR  ? Magnesium  ? Lipid panel  ? TSH  ? ? ?Over 30 minutes of exam, counseling, chart review, and critical decision making was performed ? ?Future Appointments  ?Date Time Provider [place] ?08/17/2021  9:00 AM ANewton Pigg RPH [facility] None  ?10/14/2021 10:15 AM DBo Merino MD [facility] None  ?10/18/2021  9:00 AM CLiane Comber NP [facility] None  ?07/22/2022 10:00 AM CLiane Comber NP [facility] None  ? ? ? ?Plan:  ? ?During the course of the visit the patient was educated and counseled about appropriate screening and preventive services including:  ? ?Pneumococcal vaccine  ?Influenza vaccine ?Td vaccine ?Prevnar 13 ?Screening electrocardiogram ?Screening mammography ?Bone densitometry screening ?Colorectal cancer  screening ?Diabetes screening ?Glaucoma screening ?Nutrition counseling  ?Advanced directives: given info/requested copies ? ? ?Subjective:  ? ?  [name] [name] is a 86 y.o. female who presents for AWV and follow up. She has Essential hypertension; Mild intermittent asthma without complication; ESOPHAGEAL STRICTURE; Incarcerated recurrent paraesophageal hiatal hernia s/p lap redo repair [id number]; MGUS (monoclonal gammopathy of unknown significance); Vitamin D deficiency; Osteoporosis; Coronary artery disease involving native heart without angina pectoris; Depression, major, in partial remission ([place]); Chronic pain syndrome; Hyperlipidemia, mixed; Gastroesophageal reflux disease; DDD (degenerative disc disease), lumbar; Spinal stenosis, lumbar region, with neurogenic claudication; Odynophagia; Atherosclerosis of aorta ([place]) by Lumbar CYT scan 01/29/2021; Chronic bronchitis (HCC); T12 compression fracture (HCC); CKD (chronic kidney disease) stage 2, GFR 60-89 ml/min; Abnormal glucose (prediabetes); FHx: heart disease; Senile purpura ([place]); History of fundoplication; Atypical chest pain; History of adenomatous polyp of colon; and Iron deficiency on their problem list. ? ? ?Patient has a chronic pain syndrome and is followed by Dr Nicholaus Bloom (pain management) and Dr Lorin Mercy.  Hx of numerous lumbar surgeries, disc and fusions historically, spinal stenosis, several thoracic compression fractures with kyphoplasty.   ? ?She is followed by Dr. Estanislado Pandy for prolia injections; hx/o Osteoporosis with T-2.6 at last DexaBMD on 11/2018 (improved from -2.8 in 2018). She expressed preference to avoid opioids and is prescribed gabapentin and cymbalta for pain and reports does well.  ? ?Follows closely with GI; hx of fundoplication; frequent nausea, had GI workup; she was advised to continue omeprazole BID, also carafate.  ? ?She followes with Dr. Melvyn Novas for COPD per imaging only, asthma, on breo and doing well.  ? ?She has history of  MGUS with negative BX.  ? ?she has a diagnosis of anxiety./depression and is currently on cymbalta 30 mg daily, xanax 0.25-0.5 mg TID PRN, reports symptoms are well controlled and hasn't needed in a very long time.  ? ?BMI is Body mass index is 26.06 kg/m?., she has been working on diet and exercise, trying to work more in garden and trying to increase walking , will walk 60 min 2-3 days a week.  ?Wt Readings from Last 3 Encounters:  ?07/22/21 151 lb 12.8 oz (68.9 kg)  ?07/05/21 147 lb 3.2 oz (66.8 kg)  ?05/12/21 143 lb 6.4 oz (65 kg)  ? ?She also has history of PTCA/stent placed in 2003 and low risk myoview in 2014. She has aortic atherosclerosis per numerous CTs, e.g 04/2019. She follows with Dr. Marlou Porch. ? ?Her blood pressure has been controlled at home, today their BP is BP: 110/60  ?She does not work out due to chronic pain. She denies CP but endorses some exertional dyspnea if rushes.  ? ?She is not on cholesterol medication and denies myalgias. Her cholesterol is not at goal of LDL <70. The cholesterol last visit was:   ?Lab Results  ?Component Value Date  ? CHOL 146 02/18/2021  ? HDL 67 02/18/2021  ? [place] 61 02/18/2021  ? TRIG 96 02/18/2021  ? CHOLHDL 2.2 02/18/2021  ?  ?She has been working on diet and exercise for prediabetes (x 2010), and denies polydipsia, polyuria and visual disturbances. Last A1C in the office was:  ?Lab Results  ?Component Value Date  ? HGBA1C 5.7 (H) 02/18/2021  ? ?Last GFR  ?Lab Results  ?Component Value Date  ? GFRNONAA 61 10/15/2020  ? ?Patient is on Vitamin D supplement and at goal at last check:  ?Lab Results  ?Component Value Date  ? VD25OH 98 04/13/2021  ?   ?She is on a B12 supplement;  ?Lab Results  ?Component Value Date  ? VITAMINB12 772  10/15/2020  ? ? ?Medication Review ?Current Outpatient Medications on File Prior to Visit  ?Medication Sig Dispense Refill  ? albuterol (VENTOLIN HFA) 108 (90 Base) MCG/ACT inhaler INHALE 2 PUFFS BY MOUTH EVERY 6 HOURS AS NEEDED FOR  WHEEZING FOR SHORTNESS OF BREATH 9 g 0  ? amLODipine (NORVASC) 10 MG tablet TAKE 1 TABLET BY MOUTH ONCE DAILY FOR BLOOD PRESSURE AND FOR HEART 90 tablet 3  ? aspirin EC 81 MG tablet Take 81 mg by mouth at bedtime. Every other ni

## 2021-07-23 LAB — CBC WITH DIFFERENTIAL/PLATELET
Absolute Monocytes: 1134 cells/uL — ABNORMAL HIGH (ref 200–950)
Basophils Absolute: 65 cells/uL (ref 0–200)
Basophils Relative: 0.6 %
Eosinophils Absolute: 251 cells/uL (ref 15–500)
Eosinophils Relative: 2.3 %
HCT: 41.9 % (ref 35.0–45.0)
Hemoglobin: 13.8 g/dL (ref 11.7–15.5)
Lymphs Abs: 2169 cells/uL (ref 850–3900)
MCH: 31.5 pg (ref 27.0–33.0)
MCHC: 32.9 g/dL (ref 32.0–36.0)
MCV: 95.7 fL (ref 80.0–100.0)
MPV: 11 fL (ref 7.5–12.5)
Monocytes Relative: 10.4 %
Neutro Abs: 7281 cells/uL (ref 1500–7800)
Neutrophils Relative %: 66.8 %
Platelets: 301 10*3/uL (ref 140–400)
RBC: 4.38 10*6/uL (ref 3.80–5.10)
RDW: 13.1 % (ref 11.0–15.0)
Total Lymphocyte: 19.9 %
WBC: 10.9 10*3/uL — ABNORMAL HIGH (ref 3.8–10.8)

## 2021-07-23 LAB — COMPLETE METABOLIC PANEL WITH GFR
AG Ratio: 1.3 (calc) (ref 1.0–2.5)
ALT: 19 U/L (ref 6–29)
AST: 14 U/L (ref 10–35)
Albumin: 3.6 g/dL (ref 3.6–5.1)
Alkaline phosphatase (APISO): 55 U/L (ref 37–153)
BUN/Creatinine Ratio: 31 (calc) — ABNORMAL HIGH (ref 6–22)
BUN: 27 mg/dL — ABNORMAL HIGH (ref 7–25)
CO2: 25 mmol/L (ref 20–32)
Calcium: 8.6 mg/dL (ref 8.6–10.4)
Chloride: 106 mmol/L (ref 98–110)
Creat: 0.88 mg/dL (ref 0.60–0.95)
Globulin: 2.8 g/dL (calc) (ref 1.9–3.7)
Glucose, Bld: 75 mg/dL (ref 65–99)
Potassium: 4.5 mmol/L (ref 3.5–5.3)
Sodium: 141 mmol/L (ref 135–146)
Total Bilirubin: 0.5 mg/dL (ref 0.2–1.2)
Total Protein: 6.4 g/dL (ref 6.1–8.1)
eGFR: 65 mL/min/{1.73_m2} (ref >=60)

## 2021-07-23 LAB — LIPID PANEL
Cholesterol: 147 mg/dL (ref <200)
HDL: 63 mg/dL (ref >=50)
LDL Cholesterol (Calc): 63 mg/dL (calc)
Non-HDL Cholesterol (Calc): 84 mg/dL (calc) (ref <130)
Total CHOL/HDL Ratio: 2.3 (calc) (ref <5.0)
Triglycerides: 126 mg/dL (ref <150)

## 2021-07-23 LAB — TSH: TSH: 0.78 mIU/L (ref 0.40–4.50)

## 2021-07-23 LAB — MAGNESIUM: Magnesium: 2 mg/dL (ref 1.5–2.5)

## 2021-08-05 ENCOUNTER — Other Ambulatory Visit: Payer: Self-pay | Admitting: Internal Medicine

## 2021-08-05 DIAGNOSIS — Z1231 Encounter for screening mammogram for malignant neoplasm of breast: Secondary | ICD-10-CM

## 2021-08-12 ENCOUNTER — Ambulatory Visit: Payer: Self-pay

## 2021-08-12 ENCOUNTER — Encounter: Payer: Self-pay | Admitting: Surgery

## 2021-08-12 ENCOUNTER — Ambulatory Visit
Admission: RE | Admit: 2021-08-12 | Discharge: 2021-08-12 | Disposition: A | Discharge status: Home / Self Care | Payer: PPO | Source: Ambulatory Visit | Attending: Surgery | Admitting: Surgery

## 2021-08-12 ENCOUNTER — Ambulatory Visit (INDEPENDENT_AMBULATORY_CARE_PROVIDER_SITE_OTHER): Payer: PPO | Admitting: Surgery

## 2021-08-12 ENCOUNTER — Telehealth: Payer: Self-pay

## 2021-08-12 VITALS — BP 152/82 | HR 83 | Ht 64.0 in | Wt 151.8 lb

## 2021-08-12 DIAGNOSIS — M5416 Radiculopathy, lumbar region: Secondary | ICD-10-CM | POA: Diagnosis not present

## 2021-08-12 DIAGNOSIS — M160 Bilateral primary osteoarthritis of hip: Secondary | ICD-10-CM | POA: Diagnosis not present

## 2021-08-12 DIAGNOSIS — M7061 Trochanteric bursitis, right hip: Secondary | ICD-10-CM

## 2021-08-12 DIAGNOSIS — M25551 Pain in right hip: Secondary | ICD-10-CM | POA: Diagnosis not present

## 2021-08-12 DIAGNOSIS — S32511A Fracture of superior rim of right pubis, initial encounter for closed fracture: Secondary | ICD-10-CM | POA: Diagnosis not present

## 2021-08-12 DIAGNOSIS — M5441 Lumbago with sciatica, right side: Secondary | ICD-10-CM

## 2021-08-12 DIAGNOSIS — M4326 Fusion of spine, lumbar region: Secondary | ICD-10-CM | POA: Diagnosis not present

## 2021-08-12 DIAGNOSIS — Z90710 Acquired absence of both cervix and uterus: Secondary | ICD-10-CM | POA: Diagnosis not present

## 2021-08-12 DIAGNOSIS — M25561 Pain in right knee: Secondary | ICD-10-CM

## 2021-08-12 NOTE — Progress Notes (Signed)
Office Visit Note   Patient: [name] [name]           Date of Birth: 28-Jul-1935           MRN: [id number] Visit Date: 08/12/2021              Requested by: [name], [address] [place],  [place] [address] PCP: [name], MD   Assessment & Plan: Visit Diagnoses:  1. Fusion of lumbar spine   2. Acute pain of right knee   3. Acute right-sided low back pain with right-sided sciatica   4. Greater trochanteric bursitis of right hip   5. Pain in right hip     Plan: In hopes to give the patient some relief of her right lateral hip pain offered injection.  Patient sent right lateral hip was prepped with Betadine and greater trochanter bursa Marcaine/Depo-Medrol injection was performed.  After sitting for few minutes patient reported good relief with anesthetic in place.  I will have her follow-up with Dr. Louanne Skye in a few weeks for recheck of her lumbar spine and hip.  Follow-Up Instructions: Return in about 3 weeks (around 09/02/2021) for WITH DR [place] RECHECK LUMBAR AND HIP.   Orders:  Orders Placed This Encounter  Procedures   Large Joint Inj: R greater trochanter   XR KNEE 3 VIEW RIGHT   XR Lumbar Spine 2-3 Views   XR Sacrum/Coccyx   XR Pelvis 1-2 Views   CT LUMBAR SPINE WO CONTRAST   CT PELVIS WO CONTRAST   No orders of the defined types were placed in this encounter.     Procedures: Large Joint Inj: R greater trochanter on 08/12/2021 11:10 AM Indications: pain Details: 22 G 1.5 in needle, lateral approach Medications: 3 mL lidocaine 1 %; 6 mL bupivacaine 0.5 %; 40 mg methylPREDNISolone acetate 40 MG/ML Consent was given by the patient. Patient was prepped and draped in the usual sterile fashion.      Clinical Data: No additional findings.   Subjective: Chief Complaint  Patient presents with   Lower Back - Pain   Right Knee - Pain    HPI 86 year old white female comes in today with complaints of low back pain, right knee  pain and right lateral hip pain.  Patient states that 3 days ago she fell off a stool when it rolled back.  She is most complaining of pain in the right buttock and right lateral hip.  Pain aggravated when she ambulates and lays on her right side. Review of Systems No current cardiopulmonary GI/GU issues  Objective: Vital Signs: BP (!) 152/82   Pulse 83   Ht '5\' 4"'$  (1.626 m)   Wt 151 lb 12.8 oz (68.9 kg)   BMI 26.06 kg/m   Physical Exam HENT:     Head: Normocephalic and atraumatic.     Nose: Nose normal.  Eyes:     Extraocular Movements: Extraocular movements intact.  Pulmonary:     Effort: No respiratory distress.  Musculoskeletal:     Comments: Gait is somewhat antalgic.  She is mild lumbar paraspinal tenderness/spasm.  She has marked tenderness over the right hip greater trochanter bursa.  Mild right-sided notch tenderness.  Negative logroll bilateral hips.  No focal motor deficits.  Neurological:     Mental Status: She is alert and oriented to person, place, and time.  Psychiatric:        Mood and Affect: Mood normal.     Ortho Exam  Specialty Comments:  No specialty comments available.  Imaging: CT LUMBAR SPINE WO CONTRAST  Result Date: 08/12/2021 CLINICAL DATA:  Lumbar radiculopathy, trauma Back trauma, no prior imaging (Age >= 16y) RECENT TRAUMA EXAM: CT LUMBAR SPINE WITHOUT CONTRAST TECHNIQUE: Multidetector CT imaging of the lumbar spine was performed without intravenous contrast administration. Multiplanar CT image reconstructions were also generated. RADIATION DOSE REDUCTION: This exam was performed according to the departmental dose-optimization program which includes automated exposure control, adjustment of the mA and/or kV according to patient size and/or use of iterative reconstruction technique. COMPARISON:  None. FINDINGS: Segmentation: 5 lumbar type vertebrae. Alignment: Trace anterolisthesis at L2-L3, unchanged. Unchanged trace retrolisthesis at L3-L4. Unchanged  trace anterolisthesis at L4-L5. Vertebrae: There are multiple chronic compression deformities involving vertebral bodies T11 through L4 with multiple prior vertebroplasties and prior posterior fusion from L1-L3. There is unchanged vertebral body height loss without progression. No evidence of new compression fracture. There is unchanged lucency along the left greater than right L1 pedicle screws. Hardware appears otherwise intact. Osteopenia with areas of increased sclerosis in the sacrum and bilateral iliac bones along the SI joints, see separately dictated pelvis CT. Paraspinal and other soft tissues: Negative. Aortoiliac atherosclerosis. Disc levels: T11-T12: Mild superior T12 endplate retropulsion. There is no significant spinal canal stenosis. There is mild bilateral neural foraminal stenosis due to bony spurring/facet arthropathy. T12-L1: Mild superior L1 endplate retropulsion. No significant stenosis. L1-L2: Moderate bilateral facet arthropathy results in mild neural foraminal stenosis bilaterally. No spinal canal stenosis. L2-L3: Prior right hemilaminectomy. Patent spinal canal. Posterior elements are solidly fused. Mild bony neural foraminal narrowing, left greater than right. L3-L4: Minimal disc bulging with bilateral facet arthropathy and ligamentum flavum thickening. Mild spinal canal stenosis. Moderate bilateral neural foraminal narrowing. L4-L5: Trace anterolisthesis. Prior right hemilaminectomy. Disc uncovering with broad-based disc bulging and bilateral facet arthropathy. There is mild bilateral subarticular stenosis. Moderate bilateral neural foraminal stenosis, left greater than right. L5-S1: Endplate spurring and bilateral facet arthropathy. Prior hemilaminectomy. Patent spinal canal. There is moderate bilateral neural foraminal stenosis. IMPRESSION: Multiple chronic compression deformities involving vertebral bodies T11 through L4, with multiple prior vertebroplasties and prior posterior fusion  from L1-L3. No evidence of progressive height loss or new compression fracture. Unchanged lucency along the L1 pedicle screws, left worse than right. Unchanged multilevel neural foraminal stenosis as described above. Electronically Signed   By: Maurine Simmering M.D.   On: 08/12/2021 13:14   CT PELVIS WO CONTRAST  Result Date: 08/12/2021 CLINICAL DATA:  Hip trauma, fracture suspected, no prior imaging RIGHT HIP PAIN, PELVIC PAIN. RECENT FALL EXAM: CT PELVIS WITHOUT CONTRAST TECHNIQUE: Multidetector CT imaging of the pelvis was performed following the standard protocol without intravenous contrast. RADIATION DOSE REDUCTION: This exam was performed according to the departmental dose-optimization program which includes automated exposure control, adjustment of the mA and/or kV according to patient size and/or use of iterative reconstruction technique. COMPARISON:  Same day radiographs, pelvis CT 12/20/2019, lumbar spine CT 01/29/2021 FINDINGS: Urinary Tract:  No abnormality visualized. Bowel:  Unremarkable visualized pelvic bowel loops. Vascular/Lymphatic: Aortoiliac atherosclerosis.  No lymphadenopathy. Reproductive:  Prior hysterectomy. Other:  None Musculoskeletal: Chronic right puboacetabular junction fracture, chronic right inferior pubic ramus fracture, and chronic left pubic bone and left inferior pubic ramus fractures. There is no evidence of a femoral neck fracture. There is moderate bilateral hip osteoarthritis. There is progressive osteopenia of the sacrum and iliac bones along the sacroiliac joints with areas of increased sclerosis/trabecular thickening. Prior L4 vertebroplasty, see separately dictated  lumbar spine CT. IMPRESSION: Chronic fractures of the right puboacetabular junction, right inferior pubis ramus fracture, and left pubic bone and inferior pubic ramus. No evidence of acute fracture. Progressive osteopenia of the sacrum and iliac bones along the sacroiliac joints with areas of increased sclerosis  possibly due to prior or healing insufficiency fractures. This is similar appearance to prior lumbar spine CT in September 2022 and progressed since prior pelvis CT in August 2021. Electronically Signed   By: Maurine Simmering M.D.   On: 08/12/2021 13:13     PMFS History: Patient Active Problem List   Diagnosis Date Noted   Iron deficiency 10/16/2020   History of adenomatous polyp of colon 07/21/2020   Atypical chest pain 07/08/2020   History of fundoplication    Senile purpura ([place]) 07/05/2019   Abnormal glucose (prediabetes) 03/01/2018   FHx: heart disease 03/01/2018   CKD (chronic kidney disease) stage 2, GFR 60-89 ml/min 09/27/2017   T12 compression fracture ([facility]) 09/26/2017   Chronic bronchitis ([place]) 08/09/2017   Atherosclerosis of aorta (HCC) by Lumbar CYT scan 01/29/2021 04/05/2017   Odynophagia    Spinal stenosis, lumbar region, with neurogenic claudication 05/29/2015   DDD (degenerative disc disease), lumbar 04/21/2015   Hyperlipidemia, mixed 11/24/2014   Gastroesophageal reflux disease 11/24/2014   Depression, major, in partial remission ([place]) 09/26/2014   Chronic pain syndrome 09/26/2014   Coronary artery disease involving native heart without angina pectoris    Osteoporosis 02/10/2014   Vitamin D deficiency 08/14/2013   MGUS (monoclonal gammopathy of unknown significance) 03/05/2013   ESOPHAGEAL STRICTURE 08/28/2008   Mild intermittent asthma without complication 24/23/5361   Incarcerated recurrent paraesophageal hiatal hernia s/p lap redo repair [id number] 01/31/2008   Essential hypertension 07/25/2007   Past Medical History:  Diagnosis Date   Anxiety    takes Xanax daily as needed   Asthma    Albuterol daily as needed   Blood transfusion without reported diagnosis    CAD (coronary artery disease)    LHC 2003 with 40% pLAD, 30% mLAD, 100% D1 (moderate vessel), 100%  D2 (small vessel), 95% mid small OM2.  Pt had PTCA to D1;  D2 and OM2 small vessels and tx medically;  Last  Myoview (2012): anterior infarct seen with scar, no ischemia. EF normal. Med Rx // Myoview (12/14):  Low risk; ant scar w/ peri-infarct ischemia; EF 55% with ant AK // Myoview 5/16: EF 55-65, ant, apical scar, no ischemia    Cataract    Chronic back pain    HNP, DDD, spinal stenosis, vertebral compression fractures.    Chronic nausea    takes Zofran daily as needed.  normal gastric emptying study 03/2013   CKD (chronic kidney disease), stage III ([place]) 09/27/2017   Constipation    felt to be functional. takes Senokot and Miralax daily.  treated with Linzess in past.    DIVERTICULOSIS, COLON 08/28/2008   Qualifier: Diagnosis of  By: [name], [name], [name]     Elevated LFTs 2015   probably med induced DILI, from Augmentin vs Pravastatin   GERD (gastroesophageal reflux disease)    hx of esophageal stricture    Hiatal hernia    Paraesophageal hernias. s/p fundoplications in 0867 and 04/2013   History of bronchitis several of yrs ago   History of colon polyps 03/2009, 03/2011   adenomatous colon polyps, no high grade dysplasia.    History of vertigo    no meds   Hyperlipidemia    takes Pravastatin daily   Hypertension  takes Amlodipine,Metoprolol,and Losartan daily   Multiple closed pelvic fractures without disruption of pelvic circle (HCC)    Nausea 03/2016   Osteoporosis    PONV (postoperative nausea and vomiting)    yrs ago   Pubic ramus fracture (HCC) 12/20/2019   Slow urinary stream    takes Rapaflo daily   TIA (transient ischemic attack) 1995   no residual effects noted   Vertebral compression fracture ([place]) 06/23/2012    Family History  Problem Relation Age of Onset   Colon cancer Mother 52   Heart attack Father 10       heart attack   Brain cancer Brother        tumor    Heart disease Brother    Heart disease Brother    Kidney disease Daughter    Esophageal cancer Neg Hx    Stomach cancer Neg Hx     Past Surgical History:  Procedure Laterality Date   APPENDECTOMY      patient unsure of date   BACK [place]     Patient unsure of date.   CORONARY ANGIOPLASTY  11/16/2001   1 stent   ESOPHAGEAL MANOMETRY N/A 03/25/2013   Procedure: ESOPHAGEAL MANOMETRY (EM);  Surgeon: Inda Castle, MD;  Location: [facility] ENDOSCOPY;  Service: Endoscopy;  Laterality: N/A;   ESOPHAGOGASTRODUODENOSCOPY N/A 03/15/2013   Procedure: ESOPHAGOGASTRODUODENOSCOPY (EGD);  Surgeon: Jerene Bears, MD;  Location[facility] ENDOSCOPY;  Service: Endoscopy;  Laterality: N/A;   ESOPHAGOGASTRODUODENOSCOPY N/A 12/25/2015   Procedure: ESOPHAGOGASTRODUODENOSCOPY (EGD);  Surgeon: Doran Stabler, MD;  Location: [facility] ENDOSCOPY;  Service: Endoscopy;  Laterality: N/A;   EYE SURGERY  11/2000   bilateral cataracts with lens implant   FIXATION KYPHOPLASTY LUMBAR SPINE  X 2   "L3-4"   HEMORRHOID SURGERY     HIATAL HERNIA REPAIR  06/03/2011   Procedure: LAPAROSCOPIC REPAIR OF HIATAL HERNIA;  Surgeon: Adin Hector, MD;  Location: [facility] ORS;  Service: General;  Laterality: N/A;   HIATAL HERNIA REPAIR N/A 04/24/2013   Procedure: LAPAROSCOPIC  REPAIR RECURRENT PARASOPHAGEAL HIATAL HERNIA WITH FUNDOPLICATION;  Surgeon: Adin Hector, MD;  Location: [facility] ORS;  Service: General;  Laterality: N/A;   INSERTION OF MESH N/A 04/24/2013   Procedure: INSERTION OF MESH;  Surgeon: Adin Hector, MD;  Location: [facility] ORS;  Service: General;  Laterality: N/A;   IR KYPHO THORACIC WITH BONE BIOPSY  09/29/2017   IR KYPHO THORACIC WITH BONE BIOPSY  11/03/2017   IR KYPHO THORACIC WITH BONE BIOPSY  02/14/2019   IR KYPHO THORACIC WITH BONE BIOPSY  04/02/2019   LAPAROSCOPIC LYSIS OF ADHESIONS N/A 04/24/2013   Procedure: LAPAROSCOPIC LYSIS OF ADHESIONS;  Surgeon: Adin Hector, MD;  Location: [facility];  Service: General;  Laterality: N/A;   LAPAROSCOPIC NISSEN FUNDOPLICATION  08/15/1446   Procedure: LAPAROSCOPIC NISSEN FUNDOPLICATION;  Surgeon: Adin Hector, MD;  Location: [facility] ORS;  Service: General;   Laterality: N/A;   LUMBAR FUSION  12/07/2015   Right L2-3 facetectomy, posterolateral fusion L2-3 with pedicle screws, rods, local bone graft, Vivigen cancellous chips. Fusion extended to the L1 level with pedicle screws and rods   LUMBAR LAMINECTOMY/DECOMPRESSION MICRODISCECTOMY Right 06/26/2012   Procedure: LUMBAR LAMINECTOMY/DECOMPRESSION MICRODISCECTOMY;  Surgeon: Jessy Oto, MD;  Location: [facility];  Service: Orthopedics;  Laterality: Right;  RIGHT L4-5 MICRODISCECTOMY   LUMBAR LAMINECTOMY/DECOMPRESSION MICRODISCECTOMY N/A 05/29/2015   Procedure: RIGHT L2-3 MICRODISCECTOMY;  Surgeon: Daleen Bo  Louanne Skye, MD;  Location: [facility];  Service: Orthopedics;  Laterality: N/A;   TOTAL ABDOMINAL HYSTERECTOMY  1975   partial   UPPER GASTROINTESTINAL ENDOSCOPY     Social History   Occupational History   Occupation: Retired    Fish farm manager: RETIRED    Comment: worked at [employer] Use   Smoking status: Never   Smokeless tobacco: Never  Vaping Use   Vaping Use: Never used  Substance and Sexual Activity   Alcohol use: No   Drug use: Never   Sexual activity: Not Currently

## 2021-08-12 NOTE — Telephone Encounter (Signed)
Patient came back in office to find out results of CT scan per Jeneen Rinks.  ?I spoke with Jeneen Rinks and advised patient per Jeneen Rinks no acute fracture, follow up with Dr Louanne Skye in 3 weeks and use walker instead of cane ? ?

## 2021-08-13 ENCOUNTER — Telehealth: Payer: Self-pay

## 2021-08-13 NOTE — Telephone Encounter (Signed)
LM-08/13/21-Called patient and confirmed CPP visit scheduled for 08/17/21 at 9:00AM. Completed Precall questions. ? ?Total time spent: 11 min. ?Pre-Call Questions ? ?Are you able to connect with Patient: ?Yes ?Confirmed appointment date/time with patient/caregiver?: ?Yes ?Date/time of the appointment: ?08/17/21 at 9:00AM ?Visit type: ?Phone ?Patient/Caregiver instructed to bring medications to appointment: ?Yes ?What, if any, problems do you have getting your medications from the pharmacy?: ?None ?What is your top health concern to discuss at your upcoming visit?: ?no health concerns at this time. ?Have you seen any other providers since your last visit?: ?Yes ?Details: ?04/13/21-Dale, Geni Bers, PA-C (Rheumatology)- Pt. presented to update lab work & discuss proceeding w/ her next Prolia injections. No medication changes noted. ? ?05/12/21-Wert, Christena Deem, MD (Pulmonary Disease)- Pt. presented for c/o of cough, congestion and sinus pressure. ? ?STARTED ?Azithromycin 250 MG Take 2 on day one then 1 daily x 4 days ?prednisone 10 MG Take  4 each am x 2 days,   2 each am x 2 days,  1 each am x 2 days and stop ? ?MODIFIED ?Gabapentin 300 mg Oral 4 times daily TO 400 mg Oral 4 times daily ? ?STOPPED ?Fluticasone-Salmeterol 500-50 MCG/ACT 1 puff Inhalation 2 times daily ?Meclizine HCl 25 MG Take 1/2 to 1 tablet 2 to 3 x /day ONLY if needed  For Dizziness / Vertigo  ?Patient taking differently: Take 12.5-25 mg by mouth 3 (three) times daily as needed for dizziness. Take 1/2 to 1 tablet 2 to 3 x /day ONLY if needed  For Dizziness / Vertigo ?ropinirole HCl 2 MG Take  1 tablet  3 x /day  for Restless Legs ?06/15/21-DrMelford Aase (PCP)- Pt. presented w/ c/o of prodrome of head/sinus & chest congestion. ? ?STARTED ?Dexamethasone 4 MG Take 1 tab 3 x day - 3 days, then 2 x day - 3 days, then 1 tab daily ?Pseudoephedrine HCl 120 MG Take  1 tablet  2 x /day (every 12 hours)  for Sinus & Chest Congestion ? ?MODIFIED ?Azithromycin 250 MG Take 2  tablets with Food on  Day 1, then 1 tablet Daily with Food for Sinusitis / Bronchitis Take 2 on day one then 1 daily x 4 days ?Promethazine HCl 25 MG Take  1 tablet  4 x/day  if needed for Nausea & Vomiting Take  1 tablet  Every 4 hours ONLY if needed for Nausea & Vomiting                  /TAKE 1 TABLET BY MOUTH EVERY 4 HOURS AS NEEDED FOR NAUSEA AND VOMITING ?07/22/21-Ashley Corbett- Pt. presented for AWV and f/u. ? ?STOPPED ?Azithromycin 250 MG Take 2 tablets with Food on  Day 1, then 1 tablet Daily with Food for Sinusitis / Bronchitis (Completed Course) ?Calcitonin ([place]) 200 UNIT/ACT 1 spray Alternating Nares Daily, Fortical = Miacalcin. One spray in alternating nostril daily. ?Dexamethasone 4 MG Take 1 tab 3 x day - 3 days, then 2 x day - 3 days, then 1 tab daily (Completed Course) ?Mometasone Furo-Formoterol Fum 100-5 MCG/ACT 2 puffs Inhalation 2 times daily (Cost of medication) ?predniSONE 10 MG 4 tabs for 2 days, then 3 tabs for 2 days, 2 tabs for 2 days, then 1 tab for 2 days, then stop (Completed Course) ?08/12/21-Owens, Alyson Locket, PA-C (Ortho)- No notes available. ?

## 2021-08-17 ENCOUNTER — Telehealth: Payer: PPO | Admitting: Pharmacy Technician

## 2021-08-23 NOTE — Telephone Encounter (Signed)
LM-08/23/21-Called pt. at [phone] to r/s CPP visit. Unable to reach pt. Will atempt calling at another time. ? ?Total time spent: 2 min. ?

## 2021-08-30 NOTE — Telephone Encounter (Signed)
LM-08/30/21-Called pt. To schedule f/u visit with CP. Unable to reach pt. [place]. ? ?Total time spent: 2 min. ?

## 2021-09-01 NOTE — Telephone Encounter (Signed)
LM-09/01/21-Called pt. At [phone] to schedule f/u w/ CP. Third attempt reaching pt. To schedule. Unable to reach patient. LVM. ? ?Total time spent: 2 min. ?

## 2021-09-02 ENCOUNTER — Encounter: Payer: Self-pay | Admitting: Specialist

## 2021-09-02 ENCOUNTER — Ambulatory Visit: Payer: PPO | Admitting: Specialist

## 2021-09-02 VITALS — BP 167/78 | HR 71 | Ht 64.0 in | Wt 152.0 lb

## 2021-09-02 DIAGNOSIS — S22070G Wedge compression fracture of T9-T10 vertebra, subsequent encounter for fracture with delayed healing: Secondary | ICD-10-CM | POA: Diagnosis not present

## 2021-09-02 DIAGNOSIS — M8000XA Age-related osteoporosis with current pathological fracture, unspecified site, initial encounter for fracture: Secondary | ICD-10-CM

## 2021-09-02 DIAGNOSIS — M4326 Fusion of spine, lumbar region: Secondary | ICD-10-CM

## 2021-09-02 DIAGNOSIS — M4807 Spinal stenosis, lumbosacral region: Secondary | ICD-10-CM | POA: Diagnosis not present

## 2021-09-02 DIAGNOSIS — M7061 Trochanteric bursitis, right hip: Secondary | ICD-10-CM | POA: Diagnosis not present

## 2021-09-02 NOTE — Patient Instructions (Signed)
Plan: Avoid bending, stooping and avoid lifting weights greater than 10 lbs. ?Avoid prolong standing and walking. ?Avoid frequent bending and stooping  ?No lifting greater than 10 lbs. ?May use ice or moist heat for pain. ?Weight loss is of benefit. ?Handicap license is approved. ?Remain of bone building regimen. ?Calcium and vitamin D supplements.  ?Return in 3-4 months for follow up as I will  be retiring in 02/2022 ?

## 2021-09-02 NOTE — Progress Notes (Signed)
? ?Office Visit Note ?  ?Patient: [name] [name]           ?Date of Birth: July 05, 1935           ?MRN: [id number] ?Visit Date: 09/02/2021 ?             ?Requested by: [name], MD ?[address] ?[address] ?[place],  [place] [address] ?PCP: [name], MD ? ? ?Assessment & Plan: ?Visit Diagnoses:  ?1. Fusion of lumbar spine   ?2. Greater trochanteric bursitis of right hip   ?3. Spinal stenosis of lumbosacral region   ?4. Compression fracture of T9 vertebra with delayed healing   ?5. Osteoporosis with current pathological fracture, unspecified osteoporosis type, initial encounter   ? ? ?Plan: Avoid bending, stooping and avoid lifting weights greater than 10 lbs. ?Avoid prolong standing and walking. ?Avoid frequent bending and stooping  ?No lifting greater than 10 lbs. ?May use ice or moist heat for pain. ?Weight loss is of benefit. ?Handicap license is approved. ?Remain of bone building regimen. ?Calcium and vitamin D supplements.  ?Return in 3-4 months for follow up as I will  be retiring in 02/2022 ? ?Follow-Up Instructions: No follow-ups on file.  ? ?Orders:  ?No orders of the defined types were placed in this encounter. ? ?No orders of the defined types were placed in this encounter. ? ? ? ? Procedures: ?No procedures performed ? ? ?Clinical Data: ?No additional findings. ? ? ?Subjective: ?Chief Complaint  ?Patient presents with  ? Lower Back - Follow-up  ? Right Hip - Follow-up  ? ? ?86 year old female with history of osteoporosis and multiple thoracic and lumbar compression fractures in the past with insufficiency fractures of the pelvis that are known and healed. She fell about 3 weeks ago and was seen by Benjiman Core and evaluated. CT scan of the pelvic and lumbar spine and these have shown no new fractures. She had pain for several weeks but is now feeling better. No bowel or bladder difficulty.  ? ?Review of Systems  ?Constitutional: Negative.   ?HENT: Negative.    ?Eyes: Negative.    ?Respiratory: Negative.    ?Cardiovascular: Negative.   ?Gastrointestinal: Negative.   ?Endocrine: Negative.   ?Genitourinary: Negative.   ?Musculoskeletal: Negative.   ?Skin: Negative.   ?Allergic/Immunologic: Negative.   ?Neurological: Negative.   ?Hematological: Negative.   ?Psychiatric/Behavioral: Negative.    ? ? ?Objective: ?Vital Signs: BP (!) 167/78 (BP Location: Left Arm, Patient Position: Sitting)   Pulse 71   Ht '5\' 4"'$  (1.626 m)   Wt 152 lb (68.9 kg)   BMI 26.09 kg/m?  ? ?Physical Exam ?Constitutional:   ?   Appearance: She is well-developed.  ?HENT:  ?   Head: Normocephalic and atraumatic.  ?Eyes:  ?   Pupils: Pupils are equal, round, and reactive to light.  ?Pulmonary:  ?   Effort: Pulmonary effort is normal.  ?   Breath sounds: Normal breath sounds.  ?Abdominal:  ?   General: Bowel sounds are normal.  ?   Palpations: Abdomen is soft.  ?Musculoskeletal:  ?   Cervical back: Normal range of motion and neck supple.  ?   Lumbar back: Negative right straight leg raise test and negative left straight leg raise test.  ?Skin: ?   General: Skin is warm and dry.  ?Neurological:  ?   Mental Status: She is alert and oriented to person, place, and time.  ?Psychiatric:     ?  Behavior: Behavior normal.     ?   Thought Content: Thought content normal.     ?   Judgment: Judgment normal.  ? ?Back Exam  ? ?Tenderness  ?The patient is experiencing tenderness in the lumbar. ? ?Range of Motion  ?Extension:  abnormal  ?Flexion:  abnormal  ?Rotation right:  abnormal  ?Rotation left:  abnormal  ? ?Muscle Strength  ?Right Quadriceps:  5/5  ?Left Quadriceps:  5/5  ?Right Hamstrings:  5/5  ?Left Hamstrings:  5/5  ? ?Tests  ?Straight leg raise right: negative ?Straight leg raise left: negative ? ? ? ?Specialty Comments:  ?No specialty comments available. ? ?Imaging: ?No results found. ? ? ?PMFS History: ?Patient Active Problem List  ? Diagnosis Date Noted  ? Iron deficiency 10/16/2020  ? History of adenomatous polyp of colon  07/21/2020  ? Atypical chest pain 07/08/2020  ? History of fundoplication   ? Senile purpura ([place]) 07/05/2019  ? Abnormal glucose (prediabetes) 03/01/2018  ? FHx: heart disease 03/01/2018  ? CKD (chronic kidney disease) stage 2, GFR 60-89 ml/min 09/27/2017  ? T12 compression fracture ([place]) 09/26/2017  ? Chronic bronchitis ([place]) 08/09/2017  ? Atherosclerosis of aorta ([place]) by Lumbar CYT scan 01/29/2021 04/05/2017  ? Odynophagia   ? Spinal stenosis, lumbar region, with neurogenic claudication 05/29/2015  ? DDD (degenerative disc disease), lumbar 04/21/2015  ? Hyperlipidemia, mixed 11/24/2014  ? Gastroesophageal reflux disease 11/24/2014  ? Depression, major, in partial remission ([place]) 09/26/2014  ? Chronic pain syndrome 09/26/2014  ? Coronary artery disease involving native heart without angina pectoris   ? Osteoporosis 02/10/2014  ? Vitamin D deficiency 08/14/2013  ? MGUS (monoclonal gammopathy of unknown significance) 03/05/2013  ? ESOPHAGEAL STRICTURE 08/28/2008  ? Mild intermittent asthma without complication 12/20/4816  ? Incarcerated recurrent paraesophageal hiatal hernia s/p lap redo repair [id number] 01/31/2008  ? Essential hypertension 07/25/2007  ? ?Past Medical History:  ?Diagnosis Date  ? Anxiety   ? takes Xanax daily as needed  ? Asthma   ? Albuterol daily as needed  ? Blood transfusion without reported diagnosis   ? CAD (coronary artery disease)   ? [place] 2003 with 40% pLAD, 30% mLAD, 100% D1 (moderate vessel), 100%  D2 (small vessel), 95% mid small OM2.  Pt had PTCA to D1;  D2 and OM2 small vessels and tx medically;  Last Myoview (2012): anterior infarct seen with scar, no ischemia. EF normal. Med Rx // Myoview (12/14):  Low risk; ant scar w/ peri-infarct ischemia; EF 55% with ant AK // Myoview 5/16: EF 55-65, ant, apical scar, no ischemia   ? Cataract   ? Chronic back pain   ? HNP, DDD, spinal stenosis, vertebral compression fractures.   ? Chronic nausea   ? takes Zofran daily as needed.  normal gastric  emptying study 03/2013  ? CKD (chronic kidney disease), stage III ([place]) 09/27/2017  ? Constipation   ? felt to be functional. takes Senokot and Miralax daily.  treated with Linzess in past.   ? DIVERTICULOSIS, COLON 08/28/2008  ? Qualifier: Diagnosis of  By: [name], [place], [place]    ? Elevated LFTs 2015  ? probably med induced DILI, from Augmentin vs Pravastatin  ? GERD (gastroesophageal reflux disease)   ? hx of esophageal stricture   ? Hiatal hernia   ? Paraesophageal hernias. s/p fundoplications in 0263 and 04/2013  ? History of bronchitis several of yrs ago  ? History of colon polyps 03/2009, 03/2011  ? adenomatous colon polyps, no high  grade dysplasia.   ? History of vertigo   ? no meds  ? Hyperlipidemia   ? takes Pravastatin daily  ? Hypertension   ? takes Amlodipine,Metoprolol,and Losartan daily  ? Multiple closed pelvic fractures without disruption of pelvic circle (HCC)   ? Nausea 03/2016  ? Osteoporosis   ? PONV (postoperative nausea and vomiting)   ? yrs ago  ? Pubic ramus fracture ([place]) 12/20/2019  ? Slow urinary stream   ? takes Rapaflo daily  ? TIA (transient ischemic attack) 1995  ? no residual effects noted  ? Vertebral compression fracture ([place]) 06/23/2012  ?  ?Family History  ?Problem Relation Age of Onset  ? Colon cancer Mother 47  ? Heart attack Father 38  ?     heart attack  ? Brain cancer Brother   ?     tumor   ? Heart disease Brother   ? Heart disease Brother   ? Kidney disease Daughter   ? Esophageal cancer Neg Hx   ? Stomach cancer Neg Hx   ?  ?Past Surgical History:  ?Procedure Laterality Date  ? APPENDECTOMY    ? patient unsure of date  ? BACK SURGERY    ? BLADDER REPAIR    ? CHOLECYSTECTOMY    ? Patient unsure of date.  ? CORONARY ANGIOPLASTY  11/16/2001  ? 1 stent  ? ESOPHAGEAL MANOMETRY N/A 03/25/2013  ? Procedure: ESOPHAGEAL MANOMETRY (EM);  Surgeon: Inda Castle, MD;  Location: [facility] END[facility]COPY;  Service: Endoscopy;  Laterality: N/A;  ? ESOPHAGOGASTRODUODENOSCOPY N/A 03/15/2013  ?  Procedure: ESOPHAGOGASTRODUODENOSCOPY (EGD);  Surgeon: Jerene Bears, MD;  Location: [facility] ENDOSCOPY;  Service: Endoscopy;  Laterality: N/A;  ? ESOPHAGOGASTRODUODENOSCOPY N/A 12/25/2015  ? Procedure: ESOPHAGOGASTRODUODENOSCOPY (EGD)

## 2021-09-06 ENCOUNTER — Other Ambulatory Visit: Payer: Self-pay | Admitting: Internal Medicine

## 2021-09-06 DIAGNOSIS — K219 Gastro-esophageal reflux disease without esophagitis: Secondary | ICD-10-CM

## 2021-09-06 DIAGNOSIS — Z79899 Other long term (current) drug therapy: Secondary | ICD-10-CM

## 2021-09-13 ENCOUNTER — Ambulatory Visit: Payer: PPO

## 2021-09-20 ENCOUNTER — Ambulatory Visit: Payer: PPO

## 2021-09-20 ENCOUNTER — Ambulatory Visit (INDEPENDENT_AMBULATORY_CARE_PROVIDER_SITE_OTHER): Payer: PPO

## 2021-09-20 ENCOUNTER — Encounter: Payer: Self-pay | Admitting: Physician Assistant

## 2021-09-20 ENCOUNTER — Ambulatory Visit (INDEPENDENT_AMBULATORY_CARE_PROVIDER_SITE_OTHER): Payer: PPO | Admitting: Physician Assistant

## 2021-09-20 DIAGNOSIS — M25512 Pain in left shoulder: Secondary | ICD-10-CM

## 2021-09-20 DIAGNOSIS — M25511 Pain in right shoulder: Secondary | ICD-10-CM

## 2021-09-20 DIAGNOSIS — R0781 Pleurodynia: Secondary | ICD-10-CM | POA: Diagnosis not present

## 2021-09-20 MED ORDER — METHYLPREDNISOLONE ACETATE 40 MG/ML IJ SUSP
40.0000 mg | INTRAMUSCULAR | Status: AC | PRN
  Administered 2021-09-20: 40 mg via INTRA_ARTICULAR

## 2021-09-20 MED ORDER — LIDOCAINE HCL 1 % IJ SOLN
5.0000 mL | INTRAMUSCULAR | Status: AC | PRN
  Administered 2021-09-20: 5 mL

## 2021-09-20 NOTE — Progress Notes (Signed)
? ?Office Visit Note ?  ?Patient: [name] [name]           ?Date of Birth: 08-14-1935           ?MRN: [id number] ?Visit Date: 09/20/2021 ?             ?Requested by: [name], MD ?[address] ?[address] ?[place],   [address] ?PCP: [name], MD ? ?No chief complaint on file. ? ? ? ? ?HPI: ?Patient is a pleasant 86 year old patient of Dr. Kathie Dike and Jeneen Rinks.  She comes in today with her husband complaining of bilateral shoulder and left rib pain.  She was sitting outside last Thursday and when she got up she said her legs just got twisted up and she fell.  No loss of consciousness.  The pain has not gotten worse but not gotten any better.  She does have a significant history of insufficiency fractures and previous back surgery.  She denies any loss of bowel or bladder control.  She did wake up with some congestion this morning. ? ?Assessment & Plan: ?Visit Diagnoses:  ?1. Acute pain of left shoulder   ?2. Acute pain of right shoulder   ?3. Rib pain   ? ? ?Plan: Bilateral shoulder pain right greater than left.  She has good motion no bruising and no findings of any acute fracture on her shoulder x-rays.  She does have quite a bit of arthritis.  She has had steroid injections in the past and I recommended if she would like I could give her a steroid injection into the right shoulder today.  I have also asked her husband to have her evaluated by the PA for just some increased vascular markings and her x-ray.  She has not had any fever or chills.  She did do a home COVID test which was negative.  Left shoulder we will treat symptomatically I have demonstrated pendulum exercises to them as well as wall climbs.  With regards to her ribs I cannot appreciate any acute fracture.  I did asked that she continue to take deep breaths and if she had any shortness of breath she is to go to the emergency room.  While she admits she has some shortness of breath this is at baseline and has been going on a long  time ? ?Follow-Up Instructions: No follow-ups on file.  ? ?Ortho Exam ? ?Patient is alert, oriented, no adenopathy, well-dressed, normal affect, normal respiratory effort. ?Examination of her shoulder she has full forward extension.  She does have some pain but can go up bilaterally to 175 degrees.  She is able to sustain her arms over her head.  She has no bruising no crepitus on either of her shoulders she internally rotates behind her back but the right is more painful.  She has a positive empty can sign on the right.  She has 5 out of 5 strength with resisted abduction external and internal rotation she has some tenderness over the posterior lower rib.  Does not seem to be affected by deep breaths. ? ?Imaging: ?No results found. ?No images are attached to the encounter. ? ?Labs: ?Lab Results  ?Component Value Date  ? HGBA1C 5.7 (H) 02/18/2021  ? HGBA1C 5.9 (H) 10/15/2020  ? HGBA1C 5.9 (H) 04/20/2020  ? ESRSEDRATE 9 10/26/2017  ? ESRSEDRATE 1 02/27/2015  ? LABURIC 6.3 12/26/2019  ? REPTSTATUS 04/15/2018 FINAL 04/13/2018  ? CULT 10,000 COLONIES/mL CITROBACTER FREUNDII (A) 04/13/2018  ? LABORGA CITROBACTER FREUNDII (A)  04/13/2018  ? ? ? ?Lab Results  ?Component Value Date  ? ALBUMIN 3.9 01/29/2020  ? ALBUMIN 3.3 (L) 12/30/2019  ? ALBUMIN 3.2 (L) 12/29/2019  ? ? ?Lab Results  ?Component Value Date  ? MG 2.0 07/22/2021  ? MG 2.0 02/18/2021  ? MG 2.2 10/15/2020  ? ?Lab Results  ?Component Value Date  ? VD25OH 98 04/13/2021  ? VD25OH 94 02/18/2021  ? VD25OH 97 10/15/2020  ? ? ?No results found for: PREALBUMIN ? ?  Latest Ref Rng & Units 07/22/2021  ? 11:09 AM 04/13/2021  ? 10:15 AM 02/18/2021  ? 10:32 AM  ?CBC EXTENDED  ?WBC 3.8 - 10.8 Thousand/uL 10.9   6.2   9.1    ?RBC 3.80 - 5.10 Million/uL 4.38   4.46   4.77    ?Hemoglobin 11.7 - 15.5 g/dL 13.8   14.4   15.1    ?HCT 35.0 - 45.0 % 41.9   43.1   45.1    ?Platelets 140 - 400 Thousand/uL 301   304   365    ?NEUT# 1,500 - 7,800 cells/uL 7,281   2,982   5,478    ?Lymph#  850 - 3,900 cells/uL 2,169   1,922   2,193    ? ? ? ?There is no height or weight on file to calculate BMI. ? ?Orders:  ?Orders Placed This Encounter  ?Procedures  ?? XR Ribs Unilateral Left  ?? XR Shoulder Left  ?? XR Shoulder Right  ? ?No orders of the defined types were placed in this encounter. ? ? ? Procedures: ?Large Joint Inj: R subacromial bursa on 09/20/2021 2:32 PM ?Indications: diagnostic evaluation and pain ?Details: 25 G 1.5 in needle, posterior approach ? ?Arthrogram: No ? ?Medications: 5 mL lidocaine 1 %; 40 mg methylPREDNISolone acetate 40 MG/ML ?Outcome: tolerated well, no immediate complications ?Procedure, treatment alternatives, risks and benefits explained, specific risks discussed. Consent was given by the patient.  ? ? ?Clinical Data: ?No additional findings. ? ?ROS: ? ?All other systems negative, except as noted in the HPI. ?Review of Systems ? ?Objective: ?Vital Signs: There were no vitals taken for this visit. ? ?Specialty Comments:  ?No specialty comments available. ? ?PMFS History: ?Patient Active Problem List  ? Diagnosis Date Noted  ?? Iron deficiency 10/16/2020  ?? History of adenomatous polyp of colon 07/21/2020  ?? Atypical chest pain 07/08/2020  ?? History of fundoplication   ?? Senile purpura ([place]) 07/05/2019  ?? Abnormal glucose (prediabetes) 03/01/2018  ?? FHx: heart disease 03/01/2018  ?? CKD (chronic kidney disease) stage 2, GFR 60-89 ml/min 09/27/2017  ?? T12 compression fracture ([place]) 09/26/2017  ?? Chronic bronchitis ([place]) 08/09/2017  ?? Atherosclerosis of aorta ([place]) by Lumbar CYT scan 01/29/2021 04/05/2017  ?? Odynophagia   ?? Spinal stenosis, lumbar region, with neurogenic claudication 05/29/2015  ?? DDD (degenerative disc disease), lumbar 04/21/2015  ?? Hyperlipidemia, mixed 11/24/2014  ?? Gastroesophageal reflux disease 11/24/2014  ?? Depression, major, in partial remission ([place]) 09/26/2014  ?? Chronic pain syndrome 09/26/2014  ?? Coronary artery disease involving native  heart without angina pectoris   ?? Osteoporosis 02/10/2014  ?? Vitamin D deficiency 08/14/2013  ?? MGUS (monoclonal gammopathy of unknown significance) 03/05/2013  ?? ESOPHAGEAL STRICTURE 08/28/2008  ?? Mild intermittent asthma without complication 41/28/7867  ?? Incarcerated recurrent paraesophageal hiatal hernia s/p lap redo repair [id number] 01/31/2008  ?? Essential hypertension 07/25/2007  ? ?Past Medical History:  ?Diagnosis Date  ?? Anxiety   ? takes Xanax daily as needed  ??  Asthma   ? Albuterol daily as needed  ?? Blood transfusion without reported diagnosis   ?? CAD (coronary artery disease)   ? [place] 2003 with 40% pLAD, 30% mLAD, 100% D1 (moderate vessel), 100%  D2 (small vessel), 95% mid small OM2.  Pt had PTCA to D1;  D2 and OM2 small vessels and tx medically;  Last Myoview (2012): anterior infarct seen with scar, no ischemia. EF normal. Med Rx // Myoview (12/14):  Low risk; ant scar w/ peri-infarct ischemia; EF 55% with ant AK // Myoview 5/16: EF 55-65, ant, apical scar, no ischemia   ?? Cataract   ?? Chronic back pain   ? HNP, DDD, spinal stenosis, vertebral compression fractures.   ?? Chronic nausea   ? takes Zofran daily as needed.  normal gastric emptying study 03/2013  ?? CKD (chronic kidney disease), stage III ([place]) 09/27/2017  ?? Constipation   ? felt to be functional. takes Senokot and Miralax daily.  treated with Linzess in past.   ?? DIVERTICULOSIS, COLON 08/28/2008  ? Qualifier: Diagnosis of  By: [name], [name], [name]    ?? Elevated LFTs 2015  ? probably med induced DILI, from Augmentin vs Pravastatin  ?? GERD (gastroesophageal reflux disease)   ? hx of esophageal stricture   ?? Hiatal hernia   ? Paraesophageal hernias. s/p fundoplications in 9728 and 04/2013  ?? History of bronchitis several of yrs ago  ?? History of colon polyps 03/2009, 03/2011  ? adenomatous colon polyps, no high grade dysplasia.   ?? History of vertigo   ? no meds  ?? Hyperlipidemia   ? takes Pravastatin daily  ?? Hypertension    ? takes Amlodipine,Metoprolol,and Losartan daily  ?? Multiple closed pelvic fractures without disruption of pelvic circle (HCC)   ?? Nausea 03/2016  ?? Osteoporosis   ?? PONV (postoperative nausea and

## 2021-09-21 ENCOUNTER — Encounter: Payer: Self-pay | Admitting: Nurse Practitioner

## 2021-09-21 ENCOUNTER — Ambulatory Visit (INDEPENDENT_AMBULATORY_CARE_PROVIDER_SITE_OTHER): Payer: PPO | Admitting: Nurse Practitioner

## 2021-09-21 ENCOUNTER — Ambulatory Visit: Payer: PPO

## 2021-09-21 VITALS — BP 138/72 | HR 62 | Temp 97.7°F | Wt 151.2 lb

## 2021-09-21 DIAGNOSIS — J42 Unspecified chronic bronchitis: Secondary | ICD-10-CM | POA: Diagnosis not present

## 2021-09-21 DIAGNOSIS — J040 Acute laryngitis: Secondary | ICD-10-CM

## 2021-09-21 DIAGNOSIS — R058 Other specified cough: Secondary | ICD-10-CM | POA: Diagnosis not present

## 2021-09-21 DIAGNOSIS — J3089 Other allergic rhinitis: Secondary | ICD-10-CM | POA: Diagnosis not present

## 2021-09-21 DIAGNOSIS — R0981 Nasal congestion: Secondary | ICD-10-CM | POA: Diagnosis not present

## 2021-09-21 NOTE — Progress Notes (Signed)
Assessment and Plan: ? ?[name] [name] was seen today for an episodic visit. ? ?Diagnoses and all order for this visit: ? ?1. Seasonal allergic rhinitis due to other allergic trigger ?Continue OTC antihistamine. ?Stay well hydrated to keep mucus thin and productive. ? ?2. Chronic bronchitis, unspecified chronic bronchitis type ([place]) ?Continue inhaler Breo, Ventolin, nebulizer PRN ? ?3. Laryngitis ?Warm salt water gargles several times throughout the day. ?Rest voice. ? ?4. Dry cough ?Continue to stay well hydrated. ?Suggest in home Covid testing if symptoms worsen. ? ?5. Nasal congestion ?Continue OTC antihistamine. ?Stay well hydrated. ? ?Notify office for further evaluation and treatment, questions or concerns if s/s fail to improve. The risks and benefits of my recommendations, as well as other treatment options were discussed with the patient today. Questions were answered. ? ?Further disposition pending results of labs. Discussed med's effects and SE's.   ? ?Over 15 minutes of exam, counseling, chart review, and critical decision making was performed.  ? ?Future Appointments  ?Date Time Provider [place]  ?09/29/2021 10:30 AM Lanae Crumbly, PA-C [facility] None  ?10/14/2021 10:20 AM [name], MD [facility] None  ?10/28/2021 10:00 AM [name], NP [facility] None  ?01/27/2022  9:30 AM [facility] DX DEXA 1 [facility]  ?07/22/2022 10:00 AM [name], NP [facility] None  ? ? ?------------------------------------------------------------------------------------------------------------------ ? ? ?HPI ?BP 138/72   Pulse 62   Temp 97.7 ?F (36.5 ?C)   Wt 151 lb 3.2 oz (68.6 kg)   SpO2 92%   BMI 25.95 kg/m?  ? ? [name] [name] is a 86 y.o. female with history of mild intermittent asthma, and chronic bronchitis, currently controlled with Albuterol and Breo,presents for evaluation and treatment of allergic symptoms. Symptoms include: clear rhinorrhea, cough, nasal congestion, and postnasal drip,  wheezing, and are present in a seasonal pattern. Precipitants include: sitting outdoors over the weekend for longer than usual. Treatment currently includes oral antihistamines: generic allergy tablet  and is effective.  Denies any increase SOB, DOE, fever, chills. ? ?She also has a hx of GERD currently controlled on Prilosec and Carafate.  ? ? ?Past Medical History:  ?Diagnosis Date  ? Anxiety   ? takes Xanax daily as needed  ? Asthma   ? Albuterol daily as needed  ? Blood transfusion without reported diagnosis   ? CAD (coronary artery disease)   ? [place] 2003 with 40% pLAD, 30% mLAD, 100% D1 (moderate vessel), 100%  D2 (small vessel), 95% mid small OM2.  Pt had PTCA to D1;  D2 and OM2 small vessels and tx medically;  Last Myoview (2012): anterior infarct seen with scar, no ischemia. EF normal. Med Rx // Myoview (12/14):  Low risk; ant scar w/ peri-infarct ischemia; EF 55% with ant AK // Myoview 5/16: EF 55-65, ant, apical scar, no ischemia   ? Cataract   ? Chronic back pain   ? HNP, DDD, spinal stenosis, vertebral compression fractures.   ? Chronic nausea   ? takes Zofran daily as needed.  normal gastric emptying study 03/2013  ? CKD (chronic kidney disease), stage III ([place]) 09/27/2017  ? Constipation   ? felt to be functional. takes Senokot and Miralax daily.  treated with Linzess in past.   ? DIVERTICULOSIS, COLON 08/28/2008  ? Qualifier: Diagnosis of  By: [name], [place], [place]    ? Elevated LFTs 2015  ? probably med induced DILI, from Augmentin vs Pravastatin  ? GERD (gastroesophageal reflux disease)   ? hx of esophageal stricture   ?  Hiatal hernia   ? Paraesophageal hernias. s/p fundoplications in 0254 and 04/2013  ? History of bronchitis several of yrs ago  ? History of colon polyps 03/2009, 03/2011  ? adenomatous colon polyps, no high grade dysplasia.   ? History of vertigo   ? no meds  ? Hyperlipidemia   ? takes Pravastatin daily  ? Hypertension   ? takes Amlodipine,Metoprolol,and Losartan daily  ? Multiple  closed pelvic fractures without disruption of pelvic circle (HCC)   ? Nausea 03/2016  ? Osteoporosis   ? PONV (postoperative nausea and vomiting)   ? yrs ago  ? Pubic ramus fracture ([place]) 12/20/2019  ? Slow urinary stream   ? takes Rapaflo daily  ? TIA (transient ischemic attack) 1995  ? no residual effects noted  ? Vertebral compression fracture ([place]) 06/23/2012  ?  ? ?Allergies  ?Allergen Reactions  ? Ace Inhibitors Cough  ?  Per Dr Melvyn Novas pulmonology 2013  ? Aspartame Diarrhea and Nausea And Vomiting  ? Atorvastatin Other (See Comments)  ?  Muscle aches  ? Biaxin [Clarithromycin] Other (See Comments)  ?  Unknown reaction : PATIENT CAN TOLERATE Z-PAK.  Is written on patient's paper chart.  ? Daliresp [Roflumilast] Nausea And Vomiting  ? Erythromycin Other (See Comments)  ?  Unknown reaction: PATIENT CAN TOLERATE Z-PAK.  It is written on patient's paper chart.  ? Levofloxacin Nausea And Vomiting  ? ? ?Current Outpatient Medications on File Prior to Visit  ?Medication Sig  ? albuterol (VENTOLIN HFA) 108 (90 Base) MCG/ACT inhaler INHALE 2 PUFFS BY MOUTH EVERY 6 HOURS AS NEEDED FOR WHEEZING FOR SHORTNESS OF BREATH  ? amLODipine (NORVASC) 10 MG tablet TAKE 1 TABLET BY MOUTH ONCE DAILY FOR BLOOD PRESSURE AND FOR HEART  ? aspirin EC 81 MG tablet Take 81 mg by mouth at bedtime. Every other night  ? Calcium Carbonate (CALCIUM 500 PO) Take by mouth.  ? Cholecalciferol (VITAMIN D3) 5000 units CAPS Take 5,000 Units by mouth 2 (two) times daily.   ? cyclobenzaprine (FLEXERIL) 10 MG tablet Take  1 tablet  3 x /day  as needed for back pain /muscle spasm  ? diclofenac sodium (VOLTAREN) 1 % GEL Apply 2 g topically 4 (four) times daily. (Patient taking differently: Apply 2 g topically 4 (four) times daily as needed (pain).)  ? DULoxetine (CYMBALTA) 30 MG capsule TAKE 1 CAPSULE BY MOUTH ONCE DAILY, FOR MOOD AND CHRONIC PAIN  ? fluticasone furoate-vilanterol (BREO ELLIPTA) 100-25 MCG/ACT AEPB Inhale 1 puff into the lungs daily.  ?  gabapentin (NEURONTIN) 400 MG capsule Take 400 mg by mouth 4 (four) times daily.  ? ketoconazole (NIZORAL) 2 % cream APPLY ONE APPLICATION TOPICALLY TWICE DAILY  ? Magnesium 400 MG TABS Take 400 mg by mouth daily at 2 PM.  ? melatonin 5 MG TABS Take 5 mg by mouth at bedtime as needed.  ? metoprolol tartrate (LOPRESSOR) 50 MG tablet TAKE 1 TABLET BY MOUTH TWICE DAILY FOR BLOOD PRESSURE AND FOR HEART  ? Multiple Vitamins-Minerals (MULTIVITAMIN WITH MINERALS) tablet Take 1 tablet by mouth daily.  ? nitroGLYCERIN (NITROSTAT) 0.4 MG SL tablet Place 1 tablet (0.4 mg total) under the tongue every 5 (five) minutes as needed for chest pain.  ? omeprazole (PRILOSEC) 40 MG capsule Take  1 capsule 2 x /day to Prevent Indigestion & Heartburn  ? promethazine (PHENERGAN) 25 MG tablet Take  1 tablet  4 x/day  if needed for Nausea & Vomiting  ? pseudoephedrine (SUDAFED) 120  MG 12 hr tablet Take  1 tablet  2 x /day (every 12 hours)  for Sinus & Chest Congestion  ? senna-docusate (SENOKOT-S) 8.6-50 MG tablet Take 2 tablets by mouth 2 (two) times daily.  ? sucralfate (CARAFATE) 1 g tablet TAKE 1 TABLET BY MOUTH 4 TIMES DAILY BEFORE MEAL(S) AND AT BEDTIME FOR INDIGESTION AND FOR HEARTBURN  ? trospium (SANCTURA) 20 MG tablet Take 20 mg by mouth 2 (two) times daily.  ? vitamin B-12 (CYANOCOBALAMIN) 1000 MCG tablet Take 5,000 mcg by mouth daily.  ? denosumab (PROLIA) 60 MG/ML SOSY injection Inject 60 mg into the skin every 6 (six) months. (Patient not taking: Reported on 09/21/2021)  ? ?No current facility-administered medications on file prior to visit.  ? ? ?ROS: all negative except what is noted in the HPI.  ? ? ?Physical Exam: ? ?BP 138/72   Pulse 62   Temp 97.7 ?F (36.5 ?C)   Wt 151 lb 3.2 oz (68.6 kg)   SpO2 92%   BMI 25.95 kg/m?  ? ?General Appearance: NAD.  Awake, conversant and cooperative. ?Eyes: PERRLA, EOMs intact.  Sclera white.  Conjunctiva without erythema. ?Sinuses: No frontal/maxillary tenderness.  No nasal discharge.  Nares patent.  ?ENT/Mouth: Ext aud canals clear.  Bilateral TMs w/DOL and without erythema or bulging. Hearing intact.  Posterior pharynx without swelling or exudate.  Tonsils without swelling or erythema.  ?Neck: Supple

## 2021-09-21 NOTE — Patient Instructions (Signed)
Allergies, Adult An allergy means that your body reacts to something that bothers it (allergen). This can happen from something that you eat, breathe in, or touch. Allergies often affect the nose, eyes, skin, and stomach. They can be mild, moderate, or very bad (severe). An allergy cannot spread from person to person. They can happen at any age. Sometimes, people outgrow them. What are the causes? Outdoor things, such as pollen, car fumes, and mold. Indoor things, such as dust, smoke, mold, and pets. Foods. Medicines. Things that bother your skin, such as perfume and bug bites. What increases the risk? Having family members with allergies or asthma. What are the signs or symptoms? Symptoms depend on how bad your allergy is. Mild to moderate symptoms Runny nose, stuffy nose, or sneezing. Itchy mouth, ears, or throat. A feeling of mucus dripping down the back of your throat. Sore throat. Eyes that are itchy, red, watery, or puffy. A skin rash, or red, swollen areas of skin (hives). Stomach cramps or bloating. Severe symptoms Very bad allergies to food, medicine, or bug bites may cause a very bad allergy reaction (anaphylaxis). This can be life-threatening. Symptoms include: A red face. Wheezing or coughing. Swollen lips, tongue, or mouth. Tight or swollen throat. Chest pain or tightness, or a fast heartbeat. Trouble breathing or shortness of breath. Pain in your belly (abdomen), vomiting, or watery poop (diarrhea). Feeling dizzy or fainting. How is this treated?     Treatment for this condition depends on your symptoms. Treatment may include: Cold, wet cloths for itching and swelling. Eye drops, nose sprays, or skin creams. Washing out your nose each day. A humidifier. Medicines. A change to the foods you eat. Being exposed again and again to tiny amounts of allergens. This helps your body get used to them. You might have: Allergy shots. Very small amounts of allergen put  under your tongue. An emergency shot (auto-injector pen) if you have a very bad allergy reaction. This is a medicine with a needle. You can put it into your skin by yourself. Your doctor will teach you how to use it. Follow these instructions at home: Medicines  Take or apply over-the-counter and prescription medicines only as told by your doctor. If you are at risk for a very bad allergy reaction, keep an auto-injector pen with you all the time. Eating and drinking Follow instructions from your doctor about what to eat and drink. Drink enough fluid to keep your pee (urine) pale yellow. General instructions If you have ever had a very bad allergy reaction, wear a medical alert bracelet or necklace. Stay away from things that you are allergic to. Keep all follow-up visits as told by your doctor. This is important. Contact a doctor if: Your symptoms do not get better with treatment. Get help right away if: You have symptoms of a very bad allergy reaction. These include: A swollen mouth, tongue, or throat. Pain or tightness in your chest. Trouble breathing. Being short of breath. Dizziness. Fainting. Very bad pain in your belly. Vomiting. Watery poop. These symptoms may be an emergency. Do not wait to see if the symptoms will go away. Get medical help right away. Call your local emergency services (911 in the [place] Do not drive yourself to the hospital. Summary Take or apply over-the-counter and prescription medicines only as told by your doctor. Stay away from things you are allergic to. If you are at risk for a very bad allergy reaction, carry an auto-injector pen all the time.   Wear a medical alert bracelet or necklace. Very bad allergy reactions can be life-threatening. Get help right away. This information is not intended to replace advice given to you by your health care provider. Make sure you discuss any questions you have with your health care provider. Document Revised:  03/06/2019 Document Reviewed: 03/06/2019 Elsevier Patient Education  2023 Elsevier Inc.  

## 2021-09-27 ENCOUNTER — Telehealth: Payer: Self-pay | Admitting: Nurse Practitioner

## 2021-09-27 NOTE — Telephone Encounter (Signed)
Patient states that she isn't much better. She is still coughing up pea green phlegm. Has been taking Sudafed 12 hour. Please advise.[name]

## 2021-09-28 ENCOUNTER — Telehealth: Payer: Self-pay | Admitting: Pharmacist

## 2021-09-28 ENCOUNTER — Other Ambulatory Visit: Payer: Self-pay | Admitting: Nurse Practitioner

## 2021-09-28 ENCOUNTER — Other Ambulatory Visit (HOSPITAL_COMMUNITY): Payer: Self-pay

## 2021-09-28 DIAGNOSIS — E559 Vitamin D deficiency, unspecified: Secondary | ICD-10-CM

## 2021-09-28 DIAGNOSIS — M810 Age-related osteoporosis without current pathological fracture: Secondary | ICD-10-CM

## 2021-09-28 DIAGNOSIS — S3282XS Multiple fractures of pelvis without disruption of pelvic ring, sequela: Secondary | ICD-10-CM

## 2021-09-28 DIAGNOSIS — Z8781 Personal history of (healed) traumatic fracture: Secondary | ICD-10-CM

## 2021-09-28 DIAGNOSIS — Z5181 Encounter for therapeutic drug level monitoring: Secondary | ICD-10-CM

## 2021-09-28 DIAGNOSIS — U071 COVID-19: Secondary | ICD-10-CM

## 2021-09-28 MED ORDER — DOXYCYCLINE HYCLATE 100 MG PO CAPS: 100.0000 mg | ORAL_CAPSULE | Freq: Two times a day (BID) | ORAL | 0 refills | Status: DC

## 2021-09-28 MED ORDER — DOXYCYCLINE HYCLATE 100 MG PO CAPS: 100.0000 mg | ORAL_CAPSULE | Freq: Two times a day (BID) | ORAL | 0 refills | Status: AC

## 2021-09-28 NOTE — Telephone Encounter (Signed)
Patient due for Prolia on 10/24/21. She has an OV on 10/14/21 and can have labs drawn at that visit.  Updated DEXA scheduled for 01/27/22.  Per test claim, copay through pharmacy benefit is $200  For benefits for [phone] (Prolia) through medical benefit - patient's plan is active as of 05/09/21 and ends 05/08/22. For [id number], patient has 20% coinsurance. No authorization is requires.  Call Ref # [id number]  Coverage: [employer] Phone: [phone]  [name] [name], PharmD, MPH, BCPS, CPP Clinical Pharmacist (Rheumatology and Pulmonology)

## 2021-09-29 ENCOUNTER — Encounter: Payer: Self-pay | Admitting: Surgery

## 2021-09-29 ENCOUNTER — Ambulatory Visit: Payer: PPO | Admitting: Surgery

## 2021-09-29 VITALS — BP 141/77 | HR 57

## 2021-09-29 DIAGNOSIS — M4326 Fusion of spine, lumbar region: Secondary | ICD-10-CM

## 2021-09-29 DIAGNOSIS — M4807 Spinal stenosis, lumbosacral region: Secondary | ICD-10-CM

## 2021-09-29 NOTE — Progress Notes (Signed)
86 year old white female returns complaints of left-sided rib pain and low back pain.  States that her low back pain is improved.  Lumbar spine history well documented in chart.  She was recently seen by another PA in our office with the left-sided rib pain.  States that this continues to be aggravated.  I reviewed patient's chart and she has been dealing with bronchitis with cough and chest congestion over the last week.  Treatment by her primary care provider.    Exam Pleasant early female in no acute distress.  She does appear to have productive cough, runny nose in clinic today.  She has mild to moderate bilateral lumbar paraspinal tenderness/spasm.  She is ambulating well.   Plan Patient states that she has a prescription that she is close to pick up ordered by her primary care provider.  Encouraged her to make sure she does this.  Advised patient that with the cough that she is having that this is likely making her left-sided chest contusion discomfort last longer.  Told her if she is not getting better with her bronchitis by next Monday that she should contact her primary care and follow-up immediately.  We will schedule return office visit with Dr. Louanne Skye in 3 months for recheck of her lumbar.

## 2021-09-30 ENCOUNTER — Other Ambulatory Visit: Payer: Self-pay | Admitting: Nurse Practitioner

## 2021-09-30 ENCOUNTER — Telehealth: Payer: Self-pay | Admitting: Nurse Practitioner

## 2021-09-30 NOTE — Progress Notes (Deleted)
Office Visit Note  Patient: [name] [name]             Date of Birth: 29-Jun-1935           MRN: [id number]             PCP: [name], MD Referring: [name], MD Visit Date: 10/14/2021 Occupation: '@GUAROCC'$ @  Subjective:  No chief complaint on file.   History of Present Illness: [name] [name] is a 87 y.o. female ***   Activities of Daily Living:  Patient reports morning stiffness for *** {minute/hour:19697}.   Patient {ACTIONS;DENIES/REPORTS:21021675::"Denies"} nocturnal pain.  Difficulty dressing/grooming: {ACTIONS;DENIES/REPORTS:21021675::"Denies"} Difficulty climbing stairs: {ACTIONS;DENIES/REPORTS:21021675::"Denies"} Difficulty getting out of chair: {ACTIONS;DENIES/REPORTS:21021675::"Denies"} Difficulty using hands for taps, buttons, cutlery, and/or writing: {ACTIONS;DENIES/REPORTS:21021675::"Denies"}  No Rheumatology ROS completed.   PMFS History:  Patient Active Problem List   Diagnosis Date Noted   Iron deficiency 10/16/2020   History of adenomatous polyp of colon 07/21/2020   Atypical chest pain 07/08/2020   History of fundoplication    Senile purpura ([place]) 07/05/2019   Abnormal glucose (prediabetes) 03/01/2018   FHx: heart disease 03/01/2018   CKD (chronic kidney disease) stage 2, GFR 60-89 ml/min 09/27/2017   T12 compression fracture ([place]) 09/26/2017   Chronic bronchitis ([place]) 08/09/2017   Atherosclerosis of aorta (HCC) by Lumbar CYT scan 01/29/2021 04/05/2017   Odynophagia    Spinal stenosis, lumbar region, with neurogenic claudication 05/29/2015   DDD (degenerative disc disease), lumbar 04/21/2015   Hyperlipidemia, mixed 11/24/2014   Gastroesophageal reflux disease 11/24/2014   Depression, major, in partial remission ([place]) 09/26/2014   Chronic pain syndrome 09/26/2014   Coronary artery disease involving native heart without angina pectoris    Osteoporosis 02/10/2014   Vitamin D deficiency 08/14/2013   MGUS (monoclonal gammopathy of  unknown significance) 03/05/2013   ESOPHAGEAL STRICTURE 08/28/2008   Mild intermittent asthma without complication 21/30/8657   Incarcerated recurrent paraesophageal hiatal hernia s/p lap redo repair [id number] 01/31/2008   Essential hypertension 07/25/2007    Past Medical History:  Diagnosis Date   Anxiety    takes Xanax daily as needed   Asthma    Albuterol daily as needed   Blood transfusion without reported diagnosis    CAD (coronary artery disease)    LHC 2003 with 40% pLAD, 30% mLAD, 100% D1 (moderate vessel), 100%  D2 (small vessel), 95% mid small OM2.  Pt had PTCA to D1;  D2 and OM2 small vessels and tx medically;  Last Myoview (2012): anterior infarct seen with scar, no ischemia. EF normal. Med Rx // Myoview (12/14):  Low risk; ant scar w/ peri-infarct ischemia; EF 55% with ant AK // Myoview 5/16: EF 55-65, ant, apical scar, no ischemia    Cataract    Chronic back pain    HNP, DDD, spinal stenosis, vertebral compression fractures.    Chronic nausea    takes Zofran daily as needed.  normal gastric emptying study 03/2013   CKD (chronic kidney disease), stage III ([place]) 09/27/2017   Constipation    felt to be functional. takes Senokot and Miralax daily.  treated with Linzess in past.    DIVERTICULOSIS, COLON 08/28/2008   Qualifier: Diagnosis of  By: [name], [name], [name]     Elevated LFTs 2015   probably med induced DILI, from Augmentin vs Pravastatin   GERD (gastroesophageal reflux disease)    hx of esophageal stricture    Hiatal hernia    Paraesophageal hernias. s/p fundoplications in 5284 and 04/2013   History of bronchitis  several of yrs ago   History of colon polyps 03/2009, 03/2011   adenomatous colon polyps, no high grade dysplasia.    History of vertigo    no meds   Hyperlipidemia    takes Pravastatin daily   Hypertension    takes Amlodipine,Metoprolol,and Losartan daily   Multiple closed pelvic fractures without disruption of pelvic circle (HCC)    Nausea 03/2016    Osteoporosis    PONV (postoperative nausea and vomiting)    yrs ago   Pubic ramus fracture (HCC) 12/20/2019   Slow urinary stream    takes Rapaflo daily   TIA (transient ischemic attack) 1995   no residual effects noted   Vertebral compression fracture ([place]) 06/23/2012    Family History  Problem Relation Age of Onset   Colon cancer Mother 54   Heart attack Father 13       heart attack   Brain cancer Brother        tumor    Heart disease Brother    Heart disease Brother    Kidney disease Daughter    Esophageal cancer Neg Hx    Stomach cancer Neg Hx    Past Surgical History:  Procedure Laterality Date   APPENDECTOMY     patient unsure of date   BACK [place]     Patient unsure of date.   CORONARY ANGIOPLASTY  11/16/2001   1 stent   ESOPHAGEAL MANOMETRY N/A 03/25/2013   Procedure: ESOPHAGEAL MANOMETRY (EM);  Surgeon: Inda Castle, MD;  Location: [facility] END[facility];  Service: Endoscopy;  Laterality: N/A;   ESOPHAGOGASTRODUODENOSCOPY N/A 03/15/2013   Procedure: ESOPHAGOGASTRODUODENOSCOPY (EGD);  Surgeon: Jerene Bears, MD;  Location[facility] ENDOSCOPY;  Service: Endoscopy;  Laterality: N/A;   ESOPHAGOGASTRODUODENOSCOPY N/A 12/25/2015   Procedure: ESOPHAGOGASTRODUODENOSCOPY (EGD);  Surgeon: Doran Stabler, MD;  Location: [facility] ENDOSCOPY;  Service: Endoscopy;  Laterality: N/A;   EYE SURGERY  11/2000   bilateral cataracts with lens implant   FIXATION KYPHOPLASTY LUMBAR SPINE  X 2   "L3-4"   HEMORRHOID SURGERY     HIATAL HERNIA REPAIR  06/03/2011   Procedure: LAPAROSCOPIC REPAIR OF HIATAL HERNIA;  Surgeon: Adin Hector, MD;  Location: [facility]S;  Service: General;  Laterality: N/A;   HIATAL HERNIA REPAIR N/A 04/24/2013   Procedure: LAPAROSCOPIC  REPAIR RECURRENT PARASOPHAGEAL HIATAL HERNIA WITH FUNDOPLICATION;  Surgeon: Adin Hector, MD;  Location: [facility];  Service: General;  Laterality: N/A;   INSERTION OF MESH N/A 04/24/2013   Procedure: INSERTION  OF MESH;  Surgeon: Adin Hector, MD;  Location: [facility];  Service: General;  Laterality: N/A;   IR KYPHO THORACIC WITH BONE BIOPSY  09/29/2017   IR KYPHO THORACIC WITH BONE BIOPSY  11/03/2017   IR KYPHO THORACIC WITH BONE BIOPSY  02/14/2019   IR KYPHO THORACIC WITH BONE BIOPSY  04/02/2019   LAPAROSCOPIC LYSIS OF ADHESIONS N/A 04/24/2013   Procedure: LAPAROSCOPIC LYSIS OF ADHESIONS;  Surgeon: Adin Hector, MD;  Location: [facility];  Service: General;  Laterality: N/A;   LAPAROSCOPIC NISSEN FUNDOPLICATION  2/70/6237   Procedure: LAPAROSCOPIC NISSEN FUNDOPLICATION;  Surgeon: Adin Hector, MD;  Location: [facility];  Service: General;  Laterality: N/A;   LUMBAR FUSION  12/07/2015   Right L2-3 facetectomy, posterolateral fusion L2-3 with pedicle screws, rods, local bone graft, Vivigen cancellous chips. Fusion extended to the L1 level with pedicle screws and rods   LUMBAR LAMINECTOMY/DECOMPRESSION  MICRODISCECTOMY Right 06/26/2012   Procedure: LUMBAR LAMINECTOMY/DECOMPRESSION MICRODISCECTOMY;  Surgeon: Jessy Oto, MD;  Location: [place];  Service: Orthopedics;  Laterality: Right;  RIGHT L4-5 MICRODISCECTOMY   LUMBAR LAMINECTOMY/DECOMPRESSION MICRODISCECTOMY N/A 05/29/2015   Procedure: RIGHT L2-3 MICRODISCECTOMY;  Surgeon: Jessy Oto, MD;  Location: [place];  Service: Orthopedics;  Laterality: N/A;   TOTAL ABDOMINAL HYSTERECTOMY  1975   partial   UPPER GASTROINTESTINAL ENDOSCOPY     Social History   Social History Narrative   Lives with husband.   Right-handed.   3 cups caffeine per day.   Immunization History  Administered Date(s) Administered   DT (Pediatric) 05/06/2014   Influenza Split 02/07/2011   Influenza Whole 02/09/2009, 01/19/2010, 02/13/2012   Influenza, High Dose Seasonal PF 03/22/2013, 01/15/2014, 02/27/2015, 01/14/2016, 01/18/2017, 02/14/2018, 02/14/2019, 03/16/2020, 02/05/2021   PFIZER(Purple Top)SARS-COV-2 Vaccination 05/25/2019, 06/15/2019, 03/24/2020   Pneumococcal  Conjugate-13 05/06/2014   Pneumococcal Polysaccharide-23 02/09/2011   Pneumococcal-Unspecified 02/06/2010, 02/09/2012   Td 05/10/2003     Objective: Vital Signs: There were no vitals taken for this visit.   Physical Exam   Musculoskeletal Exam: ***  CDAI Exam: CDAI Score: -- Patient Global: --; Provider Global: -- Swollen: --; Tender: -- Joint Exam 10/14/2021   No joint exam has been documented for this visit   There is currently no information documented on the homunculus. Go to the Rheumatology activity and complete the homunculus joint exam.  Investigation: No additional findings.  Imaging: XR Ribs Unilateral Left  Result Date: 09/20/2021 Study of her ribs does show some increased vascular lung markings.  No acute fractures were appreciated in the in the area of concern.  She does have some previously known calcification in her aorta some slight increase in cardiac silhouette but compared to previous x-rays probably at baseline  XR Shoulder Left  Result Date: 09/20/2021 X-rays of her left shoulder demonstrate degenerative changes of the [facility] joint as well as the glenohumeral joint.  No evidence of dislocation or acute fracture she does have large osteophyte formation at the inferior humeral head  XR Shoulder Right  Result Date: 09/20/2021 Radiographs of her right shoulder demonstrate advanced arthrosis of the acromioclavicular joint she also has humeral head arthrosis.  With sclerotic changes.  No acute bony changes noted no dislocation   Recent Labs: Lab Results  Component Value Date   WBC 10.9 (H) 07/22/2021   HGB 13.8 07/22/2021   PLT 301 07/22/2021   NA 141 07/22/2021   K 4.5 07/22/2021   CL 106 07/22/2021   CO2 25 07/22/2021   GLUCOSE 75 07/22/2021   BUN 27 (H) 07/22/2021   CREATININE 0.88 07/22/2021   BILITOT 0.5 07/22/2021   ALKPHOS 185 (H) 01/29/2020   AST 14 07/22/2021   ALT 19 07/22/2021   PROT 6.4 07/22/2021   ALBUMIN 3.9 01/29/2020   CALCIUM 8.6  07/22/2021   GFRAA 71 10/15/2020    Speciality Comments: No specialty comments available.  Procedures:  No procedures performed Allergies: Ace inhibitors, Aspartame, Atorvastatin, Biaxin [clarithromycin], Daliresp [roflumilast], Erythromycin, and Levofloxacin   Assessment / Plan:     Visit Diagnoses: Age-related osteoporosis without current pathological fracture  History of vertebral compression fracture  Multiple closed fractures of pelvis without disruption of pelvic ring, sequela  Vitamin D deficiency  Encounter for medication monitoring  H/O kyphoplasty  Primary osteoarthritis of both hands  Primary osteoarthritis of both knees  DDD (degenerative disc disease), lumbar  Facet arthropathy  History of hypercholesterolemia  History of gastroesophageal reflux (GERD)  History of anxiety  History of cholecystectomy  History of appendectomy  History of asthma  History of neuropathy  History of hypertension  Orders: No orders of the defined types were placed in this encounter.  No orders of the defined types were placed in this encounter.   Face-to-face time spent with patient was *** minutes. Greater than 50% of time was spent in counseling and coordination of care.  Follow-Up Instructions: No follow-ups on file.   Ofilia Neas, PA-C  Note - This record has been created using Dragon software.  Chart creation errors have been sought, but may not always  have been located. Such creation errors do not reflect on  the standard of medical care.

## 2021-09-30 NOTE — Telephone Encounter (Signed)
Patient called into the office stating that she read the side effect's of Doxycycline and is "too scared" to take it. Please advise her if there is an alternative.

## 2021-10-02 ENCOUNTER — Other Ambulatory Visit: Payer: Self-pay | Admitting: Adult Health

## 2021-10-10 ENCOUNTER — Other Ambulatory Visit: Payer: Self-pay | Admitting: Internal Medicine

## 2021-10-10 DIAGNOSIS — R450 Nervousness: Secondary | ICD-10-CM

## 2021-10-10 MED ORDER — ALPRAZOLAM 0.5 MG PO TABS: ORAL_TABLET | ORAL | 0 refills | Status: DC

## 2021-10-12 ENCOUNTER — Encounter: Payer: Self-pay | Admitting: Nurse Practitioner

## 2021-10-12 ENCOUNTER — Ambulatory Visit (INDEPENDENT_AMBULATORY_CARE_PROVIDER_SITE_OTHER): Payer: PPO | Admitting: Nurse Practitioner

## 2021-10-12 VITALS — BP 122/72 | HR 66 | Temp 97.7°F | Wt 146.6 lb

## 2021-10-12 DIAGNOSIS — I10 Essential (primary) hypertension: Secondary | ICD-10-CM

## 2021-10-12 DIAGNOSIS — K120 Recurrent oral aphthae: Secondary | ICD-10-CM | POA: Diagnosis not present

## 2021-10-12 MED ORDER — MAGIC MOUTHWASH: 5.0000 mL | Freq: Three times a day (TID) | ORAL | 1 refills | Status: DC

## 2021-10-12 MED ORDER — NYSTATIN 100000 UNIT/ML MT SUSP: 5.0000 mL | Freq: Three times a day (TID) | OROMUCOSAL | 1 refills | Status: DC

## 2021-10-12 NOTE — Patient Instructions (Signed)
Oral Ulcers Oral ulcers are small sores inside the mouth or near the mouth. They may occur on or inside the lips, inside the cheeks, on the tongue, or anywhere else inside or near the mouth. They may be called canker sores or cold sores, which are two types of oral ulcers. Many oral ulcers are harmless and go away on their own. In some cases, oral ulcers may require medical care to determine the cause and proper treatment. What are the causes? Common causes of this condition include: Infections caused by viruses, bacteria, or fungi. Emotional stress. Foods or chemicals that irritate the mouth. Injury or physical irritation of the mouth. Medicines. Allergies. Tobacco use. Less common causes include: Skin disease. A type of herpes virus infection (herpes simplex or herpes zoster). Oral cancer. In some cases, the cause may not be known. What increases the risk? You are more likely to develop this condition if: You wear dental braces, dentures, or retainers. You do not take good care of your mouth and teeth (poor oral hygiene). You have sensitive skin. You have a condition that affects the entire body (systemic condition), such as an immune disorder. What are the signs or symptoms? The main symptom of this condition is having one or more oval-shaped or round ulcers that have red borders. Symptoms may vary depending on the cause. This includes: Location of the ulcers. Ulcers may be found inside the mouth, on the gums, or on the insides of the lips or cheeks. They may also be found on the lips or on skin that is near the mouth, such as the cheeks or chin. Pain. Ulcers can be painful and uncomfortable, or they can be painless. Appearance of the ulcers. They may look like red blisters and be filled with fluid, or they may be white or yellow patches. Frequency of outbreaks. Ulcers may go away permanently after one outbreak, or they may come back (recur) often or rarely. How is this  diagnosed? This condition is diagnosed with a physical exam. Your health care provider may ask you questions about your lifestyle and your medical history. You may have tests, including: Blood tests. Removal of a small number of cells from an ulcer to be examined under a microscope (biopsy). How is this treated? Treatment depends on the severity and cause of the condition. Oral ulcers often go away on their own in 1-2 weeks. Treatment may include medicines, such as: Medicines to treat a viral infection (antivirals), a bacterial infection (antibiotics), or a fungal infection (antifungals). Medicines to help control pain. This may include: Over-the-counter pain medicines. Gel, cream, or spray to numb the area (topical anesthetic) if you have severe pain. Other medicines to coat or numb your mouth. Follow these instructions at home: Medicines Take or use over-the-counter and prescription medicines only as told by your health care provider. If you were prescribed an antibiotic medicine, take it as told by your health care provider. Do not stop taking the antibiotic even if you start to feel better. Do not use products that contain benzocaine (including numbing gels) to treat teething or mouth pain in children who are younger than 2 years. These products may cause a rare but serious blood condition. Eating and drinking Eat a balanced diet. Do not eat: Spicy foods. Citrus, such as oranges. Other foods and drinks that you think may cause or irritate your ulcers. Drink enough fluid to keep your urine pale yellow. Avoid drinking alcohol. Lifestyle  Practice good oral hygiene: Gently brush your teeth  with a soft toothbrush two times a day. Floss your teeth every day. Get regular dental cleanings and checkups. Do not use any products that contain nicotine or tobacco. These products include cigarettes, chewing tobacco, and vaping devices, such as e-cigarettes. If you need help quitting, ask your  health care provider. Managing pain If directed, put ice on your face in the affected area to help reduce pain. To do this: Put ice in a plastic bag. Place a towel between your skin and the bag. Leave the ice on for 20 minutes, 2-3 times a day. Remove the ice if your skin turns bright red. This is very important. If you cannot feel pain, heat, or cold, you have a greater risk of damage to the area. Avoid physical or chemical irritants that may have caused the ulcers or made them worse, such as mouthwashes that contain alcohol (ethanol). If you wear dental braces, dentures, or retainers, work with your health care provider to make sure these devices are fitted correctly. If you were prescribed a prescription mouthwash to help reduce pain in your mouth, use it as told by your health care provider. General instructions Rinse your mouth with a mixture of salt and water 3-4 times a day or as told by your health care provider. To make salt water, completely dissolve -1 tsp (3-6 g) of salt in 1 cup (237 mL) of warm water. Keep all follow-up visits. This is important. Contact a health care provider if: You have: Pain that gets worse or does not get better with medicine. Four or more ulcers at one time. A fever. New ulcers that look or feel different from other ulcers you have. Inflammation in one eye or both eyes. Ulcers that do not go away after 10 days. You develop new symptoms in your mouth, such as: Bleeding or crusting around your lips or gums. Tooth pain. Difficulty swallowing. You develop symptoms on your skin or genitals, such as: A rash or blisters. Burning or itching sensations. Your ulcers begin or get worse after you start a new medicine. Get help right away if: You have difficulty breathing. You have swelling in your face or neck. You have excessive bleeding from your mouth. You have severe pain. Summary Oral ulcers may occur anywhere inside or near the mouth. They can be  caused by many things, such as infections, stress, injury or irritation, or tobacco use. Oral ulcers can be painful or painless. Treatment may include medicines to relieve pain or to treat an infection (if appropriate). Most oral ulcers go away in 1-2 weeks. This information is not intended to replace advice given to you by your health care provider. Make sure you discuss any questions you have with your health care provider. Document Revised: 02/03/2021 Document Reviewed: 02/03/2021 Elsevier Patient Education  [employer].

## 2021-10-12 NOTE — Progress Notes (Signed)
Assessment and Plan:   [name] [name] was seen today for acute visit.  Diagnoses and all orders for this visit:  Essential hypertension - continue medications, DASH diet, exercise and monitor at home. Call if greater than 130/80.   Aphthous ulcer of mouth Use Magic mouthwash three times a day.  Monitor symptoms. Avoid spicy foods If no improvement notify the office -     magic mouthwash (nystatin, lidocaine, diphenhydrAMINE, alum & mag hydroxide) suspension; Swish and spit 5 mLs 3 (three) times daily.          Further disposition pending results of labs. Discussed med's effects and SE's.   Over 30 minutes of exam, counseling, chart review, and critical decision making was performed.   Future Appointments  Date Time Provider [place]  10/14/2021 10:20 AM [name], MD [facility] None  10/28/2021 10:00 AM [name], NP [facility] None  12/01/2021 10:30 AM Jessy Oto, MD [facility] None  01/27/2022  9:30 AM [facility] DX DEXA 1 [facility]  07/22/2022 10:00 AM [name], NP [facility] None    ------------------------------------------------------------------------------------------------------------------   HPI BP 122/72   Pulse 66   Temp 97.7 F (36.5 C)   Wt 146 lb 9.6 oz (66.5 kg)   SpO2 95%   BMI 25.16 kg/m   85 y.o.female presents for sore inside her mouth which appears to be getting larger.  Has been present for about a week. It was tender but the pain has improved.   She is currently on Amlodipine 10 mg and Metroprolol 50 mg and BP is well controlled.  Denies headaches, chest pain, shortness of breath and dizziness BP Readings from Last 3 Encounters:  10/12/21 122/72  09/29/21 (!) 141/77  09/21/21 138/72     Past Medical History:  Diagnosis Date   Anxiety    takes Xanax daily as needed   Asthma    Albuterol daily as needed   Blood transfusion without reported diagnosis    CAD (coronary artery disease)    LHC 2003 with 40% pLAD, 30%  mLAD, 100% D1 (moderate vessel), 100%  D2 (small vessel), 95% mid small OM2.  Pt had PTCA to D1;  D2 and OM2 small vessels and tx medically;  Last Myoview (2012): anterior infarct seen with scar, no ischemia. EF normal. Med Rx // Myoview (12/14):  Low risk; ant scar w/ peri-infarct ischemia; EF 55% with ant AK // Myoview 5/16: EF 55-65, ant, apical scar, no ischemia    Cataract    Chronic back pain    HNP, DDD, spinal stenosis, vertebral compression fractures.    Chronic nausea    takes Zofran daily as needed.  normal gastric emptying study 03/2013   CKD (chronic kidney disease), stage III ([place]) 09/27/2017   Constipation    felt to be functional. takes Senokot and Miralax daily.  treated with Linzess in past.    DIVERTICULOSIS, COLON 08/28/2008   Qualifier: Diagnosis of  By: [name], [name], [name]     Elevated LFTs 2015   probably med induced DILI, from Augmentin vs Pravastatin   GERD (gastroesophageal reflux disease)    hx of esophageal stricture    Hiatal hernia    Paraesophageal hernias. s/p fundoplications in 8756 and 04/2013   History of bronchitis several of yrs ago   History of colon polyps 03/2009, 03/2011   adenomatous colon polyps, no high grade dysplasia.    History of vertigo    no meds   Hyperlipidemia    takes Pravastatin daily  Hypertension    takes Amlodipine,Metoprolol,and Losartan daily   Multiple closed pelvic fractures without disruption of pelvic circle (HCC)    Nausea 03/2016   Osteoporosis    PONV (postoperative nausea and vomiting)    yrs ago   Pubic ramus fracture ([place]) 12/20/2019   Slow urinary stream    takes Rapaflo daily   TIA (transient ischemic attack) 1995   no residual effects noted   Vertebral compression fracture (HCC) 06/23/2012     Allergies  Allergen Reactions   Ace Inhibitors Cough    Per Dr Melvyn Novas pulmonology 2013   Aspartame Diarrhea and Nausea And Vomiting   Atorvastatin Other (See Comments)    Muscle aches   Biaxin [Clarithromycin]  Other (See Comments)    Unknown reaction : PATIENT CAN TOLERATE Z-PAK.  Is written on patient's paper chart.   Daliresp [Roflumilast] Nausea And Vomiting   Erythromycin Other (See Comments)    Unknown reaction: PATIENT CAN TOLERATE Z-PAK.  It is written on patient's paper chart.   Levofloxacin Nausea And Vomiting    Current Outpatient Medications on File Prior to Visit  Medication Sig   albuterol (VENTOLIN HFA) 108 (90 Base) MCG/ACT inhaler INHALE 2 PUFFS BY MOUTH EVERY 6 HOURS AS NEEDED FOR WHEEZING FOR SHORTNESS OF BREATH   ALPRAZolam (XANAX) 0.5 MG tablet Take 1/2 tablet 1- 2 x /day ONLY if needed for Anxiety or Panic Attack &  limit to 5 days /week to avoid Addiction - DO NOT take with Hydrocodone or Codeine   amLODipine (NORVASC) 10 MG tablet TAKE 1 TABLET BY MOUTH ONCE DAILY FOR BLOOD PRESSURE AND FOR HEART   aspirin EC 81 MG tablet Take 81 mg by mouth at bedtime. Every other night   Calcium Carbonate (CALCIUM 500 PO) Take by mouth.   Cholecalciferol (VITAMIN D3) 5000 units CAPS Take 5,000 Units by mouth 2 (two) times daily.    cyclobenzaprine (FLEXERIL) 10 MG tablet Take  1 tablet  3 x /day  as needed for back pain /muscle spasm   diclofenac sodium (VOLTAREN) 1 % GEL Apply 2 g topically 4 (four) times daily. (Patient taking differently: Apply 2 g topically 4 (four) times daily as needed (pain).)   DULoxetine (CYMBALTA) 30 MG capsule TAKE 1 CAPSULE BY MOUTH ONCE DAILY, FOR MOOD AND CHRONIC PAIN   fluticasone furoate-vilanterol (BREO ELLIPTA) 100-25 MCG/ACT AEPB Inhale 1 puff into the lungs daily.   gabapentin (NEURONTIN) 400 MG capsule Take 400 mg by mouth 4 (four) times daily.   ketoconazole (NIZORAL) 2 % cream APPLY ONE APPLICATION TOPICALLY TWICE DAILY   Magnesium 400 MG TABS Take 400 mg by mouth daily at 2 PM.   melatonin 5 MG TABS Take 5 mg by mouth at bedtime as needed.   metoprolol tartrate (LOPRESSOR) 50 MG tablet Take 1 tablet  2 x /day(every 12 hours)  for BP & Heart                                 /                          TAKE                      BY                   MOUTH   Multiple Vitamins-Minerals (MULTIVITAMIN WITH  MINERALS) tablet Take 1 tablet by mouth daily.   nitroGLYCERIN (NITROSTAT) 0.4 MG SL tablet Place 1 tablet (0.4 mg total) under the tongue every 5 (five) minutes as needed for chest pain.   omeprazole (PRILOSEC) 40 MG capsule Take  1 capsule 2 x /day to Prevent Indigestion & Heartburn   promethazine (PHENERGAN) 25 MG tablet Take  1 tablet  4 x/day  if needed for Nausea & Vomiting   senna-docusate (SENOKOT-S) 8.6-50 MG tablet Take 2 tablets by mouth 2 (two) times daily.   sucralfate (CARAFATE) 1 g tablet TAKE 1 TABLET BY MOUTH 4 TIMES DAILY BEFORE MEAL(S) AND AT BEDTIME FOR INDIGESTION AND FOR HEARTBURN   trospium (SANCTURA) 20 MG tablet Take 20 mg by mouth 2 (two) times daily.   vitamin B-12 (CYANOCOBALAMIN) 1000 MCG tablet Take 5,000 mcg by mouth daily.   No current facility-administered medications on file prior to visit.    ROS: all negative except above.   Physical Exam:  BP 122/72   Pulse 66   Temp 97.7 F (36.5 C)   Wt 146 lb 9.6 oz (66.5 kg)   SpO2 95%   BMI 25.16 kg/m   General Appearance: Well nourished, in no apparent distress. Eyes: PERRLA, EOMs, conjunctiva no swelling or erythema Sinuses: No Frontal/maxillary tenderness ENT/Mouth: Ext aud canals clear, TMs without erythema, bulging. No erythema, swelling, or exudate on post pharynx.  Tonsils not swollen or erythematous. Hearing normal. 1 cm raised mucosa colored area on right lower cheek Neck: Supple, thyroid normal.  Respiratory: Respiratory effort normal, BS equal bilaterally without rales, rhonchi, wheezing or stridor.  Cardio: RRR with no MRGs. Brisk peripheral pulses without edema.  Abdomen: Soft, + BS.  Non tender, no guarding, rebound, hernias, masses. Lymphatics: Non tender without lymphadenopathy.  Musculoskeletal: Full ROM, 5/5 strength, normal gait.  Skin:  Warm, dry without rashes, lesions, ecchymosis.  Neuro: Cranial nerves intact. Normal muscle tone, no cerebellar symptoms. Sensation intact.  Psych: Awake and oriented X 3, normal affect, Insight and Judgment appropriate.     Alycia Rossetti, NP 2:54 PM [facility]

## 2021-10-13 ENCOUNTER — Ambulatory Visit: Payer: PPO | Admitting: Internal Medicine

## 2021-10-14 ENCOUNTER — Ambulatory Visit: Payer: PPO | Admitting: Rheumatology

## 2021-10-14 DIAGNOSIS — Z8709 Personal history of other diseases of the respiratory system: Secondary | ICD-10-CM

## 2021-10-14 DIAGNOSIS — Z9049 Acquired absence of other specified parts of digestive tract: Secondary | ICD-10-CM

## 2021-10-14 DIAGNOSIS — M5136 Other intervertebral disc degeneration, lumbar region: Secondary | ICD-10-CM

## 2021-10-14 DIAGNOSIS — S3282XS Multiple fractures of pelvis without disruption of pelvic ring, sequela: Secondary | ICD-10-CM

## 2021-10-14 DIAGNOSIS — Z8719 Personal history of other diseases of the digestive system: Secondary | ICD-10-CM

## 2021-10-14 DIAGNOSIS — Z8679 Personal history of other diseases of the circulatory system: Secondary | ICD-10-CM

## 2021-10-14 DIAGNOSIS — Z8639 Personal history of other endocrine, nutritional and metabolic disease: Secondary | ICD-10-CM

## 2021-10-14 DIAGNOSIS — Z8781 Personal history of (healed) traumatic fracture: Secondary | ICD-10-CM

## 2021-10-14 DIAGNOSIS — Z8669 Personal history of other diseases of the nervous system and sense organs: Secondary | ICD-10-CM

## 2021-10-14 DIAGNOSIS — M47819 Spondylosis without myelopathy or radiculopathy, site unspecified: Secondary | ICD-10-CM

## 2021-10-14 DIAGNOSIS — M19041 Primary osteoarthritis, right hand: Secondary | ICD-10-CM

## 2021-10-14 DIAGNOSIS — Z9889 Other specified postprocedural states: Secondary | ICD-10-CM

## 2021-10-14 DIAGNOSIS — M170 Bilateral primary osteoarthritis of knee: Secondary | ICD-10-CM

## 2021-10-14 DIAGNOSIS — Z5181 Encounter for therapeutic drug level monitoring: Secondary | ICD-10-CM

## 2021-10-14 DIAGNOSIS — M810 Age-related osteoporosis without current pathological fracture: Secondary | ICD-10-CM

## 2021-10-14 DIAGNOSIS — E559 Vitamin D deficiency, unspecified: Secondary | ICD-10-CM

## 2021-10-14 DIAGNOSIS — Z8659 Personal history of other mental and behavioral disorders: Secondary | ICD-10-CM

## 2021-10-18 ENCOUNTER — Encounter: Payer: PPO | Admitting: Adult Health

## 2021-10-19 NOTE — Telephone Encounter (Signed)
ATC patient regarding Prolia. Left VM requesting return call She has appt on 11/03/21 and can receive at that appt if she would like to pay copay through pharmacy benefit and can have labs drawn prior to appt.  Knox Saliva, PharmD, MPH, BCPS, CPP Clinical Pharmacist (Rheumatology and Pulmonology)

## 2021-10-21 NOTE — Progress Notes (Deleted)
Office Visit Note  Patient: [name] [name]             Date of Birth: 06-Jul-1935           MRN: [id number]             PCP: [name], MD Referring: [name], MD Visit Date: 11/03/2021 Occupation: '@GUAROCC'$ @  Subjective:    History of Present Illness: [name] [name] is a 86 y.o. female with history of osteoporosis and osteoarthritis.   DEXA scheduled on 01/27/22.   Activities of Daily Living:  Patient reports morning stiffness for *** {minute/hour:19697}.   Patient {ACTIONS;DENIES/REPORTS:21021675::"Denies"} nocturnal pain.  Difficulty dressing/grooming: {ACTIONS;DENIES/REPORTS:21021675::"Denies"} Difficulty climbing stairs: {ACTIONS;DENIES/REPORTS:21021675::"Denies"} Difficulty getting out of chair: {ACTIONS;DENIES/REPORTS:21021675::"Denies"} Difficulty using hands for taps, buttons, cutlery, and/or writing: {ACTIONS;DENIES/REPORTS:21021675::"Denies"}  No Rheumatology ROS completed.   PMFS History:  Patient Active Problem List   Diagnosis Date Noted   Iron deficiency 10/16/2020   History of adenomatous polyp of colon 07/21/2020   Atypical chest pain 07/08/2020   History of fundoplication    Senile purpura ([place]) 07/05/2019   Abnormal glucose (prediabetes) 03/01/2018   FHx: heart disease 03/01/2018   CKD (chronic kidney disease) stage 2, GFR 60-89 ml/min 09/27/2017   T12 compression fracture ([place]) 09/26/2017   Chronic bronchitis ([place]) 08/09/2017   Atherosclerosis of aorta (HCC) by Lumbar CYT scan 01/29/2021 04/05/2017   Odynophagia    Spinal stenosis, lumbar region, with neurogenic claudication 05/29/2015   DDD (degenerative disc disease), lumbar 04/21/2015   Hyperlipidemia, mixed 11/24/2014   Gastroesophageal reflux disease 11/24/2014   Depression, major, in partial remission ([place]) 09/26/2014   Chronic pain syndrome 09/26/2014   Coronary artery disease involving native heart without angina pectoris    Osteoporosis 02/10/2014   Vitamin D deficiency  08/14/2013   MGUS (monoclonal gammopathy of unknown significance) 03/05/2013   ESOPHAGEAL STRICTURE 08/28/2008   Mild intermittent asthma without complication 74/94/4967   Incarcerated recurrent paraesophageal hiatal hernia s/p lap redo repair [id number] 01/31/2008   Essential hypertension 07/25/2007    Past Medical History:  Diagnosis Date   Anxiety    takes Xanax daily as needed   Asthma    Albuterol daily as needed   Blood transfusion without reported diagnosis    CAD (coronary artery disease)    LHC 2003 with 40% pLAD, 30% mLAD, 100% D1 (moderate vessel), 100%  D2 (small vessel), 95% mid small OM2.  Pt had PTCA to D1;  D2 and OM2 small vessels and tx medically;  Last Myoview (2012): anterior infarct seen with scar, no ischemia. EF normal. Med Rx // Myoview (12/14):  Low risk; ant scar w/ peri-infarct ischemia; EF 55% with ant AK // Myoview 5/16: EF 55-65, ant, apical scar, no ischemia    Cataract    Chronic back pain    HNP, DDD, spinal stenosis, vertebral compression fractures.    Chronic nausea    takes Zofran daily as needed.  normal gastric emptying study 03/2013   CKD (chronic kidney disease), stage III ([place]) 09/27/2017   Constipation    felt to be functional. takes Senokot and Miralax daily.  treated with Linzess in past.    DIVERTICULOSIS, COLON 08/28/2008   Qualifier: Diagnosis of  By: [name], [name], [name]     Elevated LFTs 2015   probably med induced DILI, from Augmentin vs Pravastatin   GERD (gastroesophageal reflux disease)    hx of esophageal stricture    Hiatal hernia    Paraesophageal hernias. s/p fundoplications in 6659 and  04/2013   History of bronchitis several of yrs ago   History of colon polyps 03/2009, 03/2011   adenomatous colon polyps, no high grade dysplasia.    History of vertigo    no meds   Hyperlipidemia    takes Pravastatin daily   Hypertension    takes Amlodipine,Metoprolol,and Losartan daily   Multiple closed pelvic fractures without disruption  of pelvic circle (HCC)    Nausea 03/2016   Osteoporosis    PONV (postoperative nausea and vomiting)    yrs ago   Pubic ramus fracture (HCC) 12/20/2019   Slow urinary stream    takes Rapaflo daily   TIA (transient ischemic attack) 1995   no residual effects noted   Vertebral compression fracture ([place]) 06/23/2012    Family History  Problem Relation Age of Onset   Colon cancer Mother 22   Heart attack Father 56       heart attack   Brain cancer Brother        tumor    Heart disease Brother    Heart disease Brother    Kidney disease Daughter    Esophageal cancer Neg Hx    Stomach cancer Neg Hx    Past Surgical History:  Procedure Laterality Date   APPENDECTOMY     patient unsure of date   BACK [place]     Patient unsure of date.   CORONARY ANGIOPLASTY  11/16/2001   1 stent   ESOPHAGEAL MANOMETRY N/A 03/25/2013   Procedure: ESOPHAGEAL MANOMETRY (EM);  Surgeon: Inda Castle, MD;  Location: [facility];  Service: Endoscopy;  Laterality: N/A;   ESOPHAGOGASTRODUODENOSCOPY N/A 03/15/2013   Procedure: ESOPHAGOGASTRODUODENOSCOPY (EGD);  Surgeon: Jerene Bears, MD;  [facility] ENDOSCOPY;  Service: Endoscopy;  Laterality: N/A;   ESOPHAGOGASTRODUODENOSCOPY N/A 12/25/2015   Procedure: ESOPHAGOGASTRODUODENOSCOPY (EGD);  Surgeon: Doran Stabler, MD;  Location: [facility] ENDOSCOPY;  Service: Endoscopy;  Laterality: N/A;   EYE SURGERY  11/2000   bilateral cataracts with lens implant   FIXATION KYPHOPLASTY LUMBAR SPINE  X 2   "L3-4"   HEMORRHOID SURGERY     HIATAL HERNIA REPAIR  06/03/2011   Procedure: LAPAROSCOPIC REPAIR OF HIATAL HERNIA;  Surgeon: Adin Hector, MD;  Location: [facility];  Service: General;  Laterality: N/A;   HIATAL HERNIA REPAIR N/A 04/24/2013   Procedure: LAPAROSCOPIC  REPAIR RECURRENT PARASOPHAGEAL HIATAL HERNIA WITH FUNDOPLICATION;  Surgeon: Adin Hector, MD;  Location: [facility];  Service: General;  Laterality: N/A;   INSERTION  OF MESH N/A 04/24/2013   Procedure: INSERTION OF MESH;  Surgeon: Adin Hector, MD;  Location: [facility];  Service: General;  Laterality: N/A;   IR KYPHO THORACIC WITH BONE BIOPSY  09/29/2017   IR KYPHO THORACIC WITH BONE BIOPSY  11/03/2017   IR KYPHO THORACIC WITH BONE BIOPSY  02/14/2019   IR KYPHO THORACIC WITH BONE BIOPSY  04/02/2019   LAPAROSCOPIC LYSIS OF ADHESIONS N/A 04/24/2013   Procedure: LAPAROSCOPIC LYSIS OF ADHESIONS;  Surgeon: Adin Hector, MD;  Location: [facility];  Service: General;  Laterality: N/A;   LAPAROSCOPIC NISSEN FUNDOPLICATION  5/00/9381   Procedure: LAPAROSCOPIC NISSEN FUNDOPLICATION;  Surgeon: Adin Hector, MD;  Location: [facility];  Service: General;  Laterality: N/A;   LUMBAR FUSION  12/07/2015   Right L2-3 facetectomy, posterolateral fusion L2-3 with pedicle screws, rods, local bone graft, Vivigen cancellous chips. Fusion extended to the L1 level with pedicle screws  and rods   LUMBAR LAMINECTOMY/DECOMPRESSION MICRODISCECTOMY Right 06/26/2012   Procedure: LUMBAR LAMINECTOMY/DECOMPRESSION MICRODISCECTOMY;  Surgeon: Jessy Oto, MD;  Location: [place];  Service: Orthopedics;  Laterality: Right;  RIGHT L4-5 MICRODISCECTOMY   LUMBAR LAMINECTOMY/DECOMPRESSION MICRODISCECTOMY N/A 05/29/2015   Procedure: RIGHT L2-3 MICRODISCECTOMY;  Surgeon: Jessy Oto, MD;  Location: [place];  Service: Orthopedics;  Laterality: N/A;   TOTAL ABDOMINAL HYSTERECTOMY  1975   partial   UPPER GASTROINTESTINAL ENDOSCOPY     Social History   Social History Narrative   Lives with husband.   Right-handed.   3 cups caffeine per day.   Immunization History  Administered Date(s) Administered   DT (Pediatric) 05/06/2014   Influenza Split 02/07/2011   Influenza Whole 02/09/2009, 01/19/2010, 02/13/2012   Influenza, High Dose Seasonal PF 03/22/2013, 01/15/2014, 02/27/2015, 01/14/2016, 01/18/2017, 02/14/2018, 02/14/2019, 03/16/2020, 02/05/2021   PFIZER(Purple Top)SARS-COV-2 Vaccination 05/25/2019,  06/15/2019, 03/24/2020   Pneumococcal Conjugate-13 05/06/2014   Pneumococcal Polysaccharide-23 02/09/2011   Pneumococcal-Unspecified 02/06/2010, 02/09/2012   Td 05/10/2003     Objective: Vital Signs: There were no vitals taken for this visit.   Physical Exam Vitals and nursing note reviewed.  Constitutional:      Appearance: She is well-developed.  HENT:     Head: Normocephalic and atraumatic.  Eyes:     Conjunctiva/sclera: Conjunctivae normal.  Cardiovascular:     Rate and Rhythm: Normal rate and regular rhythm.     Heart sounds: Normal heart sounds.  Pulmonary:     Effort: Pulmonary effort is normal.     Breath sounds: Normal breath sounds.  Abdominal:     General: Bowel sounds are normal.     Palpations: Abdomen is soft.  Musculoskeletal:     Cervical back: Normal range of motion.  Skin:    General: Skin is warm and dry.     Capillary Refill: Capillary refill takes less than 2 seconds.  Neurological:     Mental Status: She is alert and oriented to person, place, and time.  Psychiatric:        Behavior: Behavior normal.      Musculoskeletal Exam: ***  CDAI Exam: CDAI Score: -- Patient Global: --; Provider Global: -- Swollen: --; Tender: -- Joint Exam 11/03/2021   No joint exam has been documented for this visit   There is currently no information documented on the homunculus. Go to the Rheumatology activity and complete the homunculus joint exam.  Investigation: No additional findings.  Imaging: No results found.  Recent Labs: Lab Results  Component Value Date   WBC 10.9 (H) 07/22/2021   HGB 13.8 07/22/2021   PLT 301 07/22/2021   NA 141 07/22/2021   K 4.5 07/22/2021   CL 106 07/22/2021   CO2 25 07/22/2021   GLUCOSE 75 07/22/2021   BUN 27 (H) 07/22/2021   CREATININE 0.88 07/22/2021   BILITOT 0.5 07/22/2021   ALKPHOS 185 (H) 01/29/2020   AST 14 07/22/2021   ALT 19 07/22/2021   PROT 6.4 07/22/2021   ALBUMIN 3.9 01/29/2020   CALCIUM 8.6  07/22/2021   GFRAA 71 10/15/2020    Speciality Comments: No specialty comments available.  Procedures:  No procedures performed Allergies: Ace inhibitors, Aspartame, Atorvastatin, Biaxin [clarithromycin], Daliresp [roflumilast], Erythromycin, and Levofloxacin   Assessment / Plan:     Visit Diagnoses: Age-related osteoporosis without current pathological fracture  History of vertebral compression fracture  Multiple closed fractures of pelvis without disruption of pelvic ring, sequela  Vitamin D deficiency  Encounter for medication monitoring  H/O kyphoplasty  Primary osteoarthritis of both hands  Primary osteoarthritis of both knees  DDD (degenerative disc disease), lumbar  Facet arthropathy  History of hypercholesterolemia  History of gastroesophageal reflux (GERD)  History of anxiety  History of cholecystectomy  History of appendectomy  History of asthma  History of neuropathy  History of hypertension  Orders: No orders of the defined types were placed in this encounter.  No orders of the defined types were placed in this encounter.   Face-to-face time spent with patient was *** minutes. Greater than 50% of time was spent in counseling and coordination of care.  Follow-Up Instructions: No follow-ups on file.   Ofilia Neas, PA-C  Note - This record has been created using Dragon software.  Chart creation errors have been sought, but may not always  have been located. Such creation errors do not reflect on  the standard of medical care.

## 2021-10-25 NOTE — Telephone Encounter (Signed)
Called patient regarding her Prolia. Patient states that she would not like to move forward with her Prolia injections as she states that they make her legs hurt and gives her a weird feeling in her mouth. She does not wish to not get an injection at her next office visit and would like to discuss other options.   Rodolph Bong, PharmD Candidate 10/25/2021 9:46 AM

## 2021-10-28 ENCOUNTER — Ambulatory Visit (INDEPENDENT_AMBULATORY_CARE_PROVIDER_SITE_OTHER): Payer: PPO | Admitting: Adult Health

## 2021-10-28 ENCOUNTER — Encounter: Payer: Self-pay | Admitting: Adult Health

## 2021-10-28 VITALS — BP 138/80 | HR 77 | Temp 97.8°F | Ht 62.0 in | Wt 151.6 lb

## 2021-10-28 DIAGNOSIS — Z86010 Personal history of colonic polyps: Secondary | ICD-10-CM

## 2021-10-28 DIAGNOSIS — E559 Vitamin D deficiency, unspecified: Secondary | ICD-10-CM

## 2021-10-28 DIAGNOSIS — I2510 Atherosclerotic heart disease of native coronary artery without angina pectoris: Secondary | ICD-10-CM | POA: Diagnosis not present

## 2021-10-28 DIAGNOSIS — Z1329 Encounter for screening for other suspected endocrine disorder: Secondary | ICD-10-CM

## 2021-10-28 DIAGNOSIS — J42 Unspecified chronic bronchitis: Secondary | ICD-10-CM

## 2021-10-28 DIAGNOSIS — G894 Chronic pain syndrome: Secondary | ICD-10-CM

## 2021-10-28 DIAGNOSIS — Z131 Encounter for screening for diabetes mellitus: Secondary | ICD-10-CM

## 2021-10-28 DIAGNOSIS — E611 Iron deficiency: Secondary | ICD-10-CM

## 2021-10-28 DIAGNOSIS — Z136 Encounter for screening for cardiovascular disorders: Secondary | ICD-10-CM

## 2021-10-28 DIAGNOSIS — R7309 Other abnormal glucose: Secondary | ICD-10-CM

## 2021-10-28 DIAGNOSIS — J4520 Mild intermittent asthma, uncomplicated: Secondary | ICD-10-CM

## 2021-10-28 DIAGNOSIS — N182 Chronic kidney disease, stage 2 (mild): Secondary | ICD-10-CM | POA: Diagnosis not present

## 2021-10-28 DIAGNOSIS — Z79899 Other long term (current) drug therapy: Secondary | ICD-10-CM | POA: Diagnosis not present

## 2021-10-28 DIAGNOSIS — E782 Mixed hyperlipidemia: Secondary | ICD-10-CM | POA: Diagnosis not present

## 2021-10-28 DIAGNOSIS — I10 Essential (primary) hypertension: Secondary | ICD-10-CM

## 2021-10-28 DIAGNOSIS — F3341 Major depressive disorder, recurrent, in partial remission: Secondary | ICD-10-CM

## 2021-10-28 DIAGNOSIS — D472 Monoclonal gammopathy: Secondary | ICD-10-CM

## 2021-10-28 DIAGNOSIS — Z0001 Encounter for general adult medical examination with abnormal findings: Secondary | ICD-10-CM | POA: Diagnosis not present

## 2021-10-28 DIAGNOSIS — M810 Age-related osteoporosis without current pathological fracture: Secondary | ICD-10-CM

## 2021-10-28 DIAGNOSIS — Z0000 Encounter for general adult medical examination without abnormal findings: Secondary | ICD-10-CM | POA: Diagnosis not present

## 2021-10-28 DIAGNOSIS — D692 Other nonthrombocytopenic purpura: Secondary | ICD-10-CM

## 2021-10-28 DIAGNOSIS — Z1389 Encounter for screening for other disorder: Secondary | ICD-10-CM

## 2021-10-28 DIAGNOSIS — K219 Gastro-esophageal reflux disease without esophagitis: Secondary | ICD-10-CM

## 2021-10-28 DIAGNOSIS — I700 Atherosclerosis of aorta: Secondary | ICD-10-CM

## 2021-10-28 DIAGNOSIS — S22080S Wedge compression fracture of T11-T12 vertebra, sequela: Secondary | ICD-10-CM

## 2021-10-28 MED ORDER — ALBUTEROL SULFATE HFA 108 (90 BASE) MCG/ACT IN AERS: INHALATION_SPRAY | RESPIRATORY_TRACT | 0 refills | Status: DC

## 2021-10-28 NOTE — Progress Notes (Signed)
Encounter for Annual Physical Exam   Assessment:   Encounter for Annual Physical Exam with abnormal findings Due annually  Health Maintenance reviewed Healthy lifestyle reviewed and goals set  Atherosclerosis of aorta ([place]) Per numerous CTs Control blood pressure, cholesterol, glucose, increase exercise.   Essential hypertension - continue medications,  - DASH diet, exercise and monitor at home. Call if greater than 130/80.  - CBC with Differential/Platelet - CMP/GFR  Prediabetes Discussed general issues about diabetes pathophysiology and management., Educational material distributed., Suggested low cholesterol diet., Encouraged aerobic exercise., Discussed foot care., Reminded to get yearly retinal exam. - Hemoglobin A1c q105m  Mixed hyperlipidemia -continue medications, check lipids, decrease fatty foods, increase activity.  - Lipid panel - TSH  Coronary artery disease due to lipid rich plaque Control blood pressure, cholesterol, glucose, increase exercise. Statin for LDL goal <70  - Lipid panel  Recurrent major depressive disorder, in partial remission (HCC) Continue medication; much improved with Cymbalta 30 mg  Lifestyle discussed: diet/exerise, sleep hygiene, stress management, hydration  Incarcerated recurrent paraesophageal hiatal hernia s/p lap redo repair [id number], continue PPI   Asthma, unspecified asthma severity, uncomplicated Continue inhalers Go to the ER if any chest pain, shortness of breath, nausea, dizziness, severe HA, changes vision/speech  Chronic bronchitis (HCC) Continue inhalers, follows with pulm - albuterol refilled  Osteoporosis Continue prolia via Dr. DEstanislado Pandy continue Vit D and Ca, weight bearing exercises - DEXA as scheduled   Herniated lumbar intervertebral disc Continue pain management  Vitamin D deficiency - Vit D  25 hydroxy (rtn osteoporosis monitoring)  MGUS (monoclonal gammopathy of unknown  significance) Continue to monitor; had negative biopsy  - CMP/GFR  Medication management - Magnesium  Chronic pain syndrome Continue pain management   Anxiety continue medications, stress management techniques discussed, increase water, good sleep hygiene discussed, increase exercise, and increase veggies.  - TSH   Gastroesophageal reflux disease, esophagitis presence not specified Continue PPI/H2 blocker, diet discussed - CMP/GFR - Magnesium    ESOPHAGEAL STRICTURE Improved on last, follow up GI as recommended; continue PPI   Constipation, chronic Continue on senokot, GI following  Spondylolisthesis, lumbar region Followed by spine specialist and pain management.   DDD (degenerative disc disease), lumbar Continue follow up pain management  Spinal stenosis, lumbar region, with neurogenic claudication Continue follow up pain management  History of lumbar spinal fusion Continue follow up pain management  [place] to age and aspirin; reassured; protect skin    Orders Placed This Encounter  Procedures   CBC with Differential/Platelet   COMPLETE METABOLIC PANEL WITH GFR   Magnesium   Lipid panel   TSH   Hemoglobin A1c   VITAMIN D 25 Hydroxy (Vit-D Deficiency, Fractures)   Microalbumin / creatinine urine ratio   Urinalysis, Routine w reflex microscopic   EKG 12-Lead    Over 30 minutes of exam, counseling, chart review, and critical decision making was performed  Future Appointments  Date Time Provider [place] 11/03/2021 10:00 AM DOfilia Neas PA-C [facility] None  12/01/2021 10:30 AM NJessy Oto MD [facility] None  01/27/2022  9:30 AM [facility] DX DEXA 1 [facility]  07/22/2022  9:30 AM CDarrol Jump NP [facility] None  10/31/2022 10:00 AM WAlycia Rossetti NP [facility] None     Plan:   During the course of the visit the patient was educated and counseled about appropriate screening and preventive services  including:   Pneumococcal vaccine  Influenza vaccine Td vaccine Prevnar 13  Screening electrocardiogram Screening mammography Bone densitometry screening Colorectal cancer screening Diabetes screening Glaucoma screening Nutrition counseling  Advanced directives: given info/requested copies   Subjective:   ILYSE TREMAIN is a 86 y.o. female who presents for CPE and follow up. She has Essential hypertension; Mild intermittent asthma without complication; ESOPHAGEAL STRICTURE; Incarcerated recurrent paraesophageal hiatal hernia s/p lap redo repair [id number]; MGUS (monoclonal gammopathy of unknown significance); Vitamin D deficiency; Osteoporosis; Coronary artery disease involving native heart without angina pectoris; Depression, major, in partial remission ([place]); Chronic pain syndrome; Hyperlipidemia, mixed; Gastroesophageal reflux disease; DDD (degenerative disc disease), lumbar; Spinal stenosis, lumbar region, with neurogenic claudication; Odynophagia; Atherosclerosis of aorta ([place]) by Lumbar CYT scan 01/29/2021; Chronic bronchitis (HCC); CKD (chronic kidney disease) stage 2, GFR 60-89 ml/min; Abnormal glucose (prediabetes); FHx: heart disease; Senile purpura ([place]); History of fundoplication; History of adenomatous polyp of colon; and Iron deficiency on their problem list.   Patient has a chronic pain syndrome and is followed by Dr Nicholaus Bloom (pain management) and Dr Lorin Mercy (ortho).  Hx of numerous lumbar surgeries, disc and fusions historically, spinal stenosis, several thoracic compression fractures with kyphoplasty. Avoiding opioids. Taking gabapentin 400 mg QID PRN and topical voltaren gel. Also on cymbalta with benefit, feels well controlled at this time.   She is followed by Dr. Estanislado Pandy for prolia injections; hx/o Osteoporosis with T-2.6 at last DexaBMD on 11/2018 (improved from -2.8 in 2018). Has next scheduled 01/27/2022.   Follows closely with GI; hx of fundoplication; frequent  nausea, had GI workup; she was advised to continue omeprazole BID, also carafate.   She followes with Dr. Melvyn Novas for COPD per imaging only, asthma, on breo and doing well.   She has history of MGUS with negative BX.   she has a diagnosis of anxiety./depression and is currently on cymbalta 30 mg daily, xanax 0.25-0.5 mg TID PRN, reports symptoms are well controlled and hasn't needed in a very long time. 10 tabs lasted almost 1 year per PDMP review.   BMI is Body mass index is 27.73 kg/m., she has been working on diet and exercise, trying to work more in garden and trying to increase walking , will walk 60 min 2-3 days a week.  Wt Readings from Last 3 Encounters:  10/28/21 151 lb 9.6 oz (68.8 kg)  10/12/21 146 lb 9.6 oz (66.5 kg)  09/21/21 151 lb 3.2 oz (68.6 kg)   She also has history of PTCA/stent placed in 2003 and low risk myoview in 2014. She has aortic atherosclerosis per numerous CTs, e.g 04/2019. She follows with Dr. Marlou Porch.  Her blood pressure has been controlled at home, today their BP is BP: 138/80  She does work out. She denies CP but endorses some exertional dyspnea if rushes.   She is not on cholesterol medication and denies myalgias. Her cholesterol is at goal of LDL <70. The cholesterol last visit was:   Lab Results  Component Value Date   CHOL 147 07/22/2021   HDL 63 07/22/2021   LDLCALC 63 07/22/2021   TRIG 126 07/22/2021   CHOLHDL 2.3 07/22/2021    She has been working on diet and exercise for prediabetes (x 2010), and denies polydipsia, polyuria and visual disturbances. Last A1C in the office was:  Lab Results  Component Value Date   HGBA1C 5.7 (H) 02/18/2021   Last GFR  Lab Results  Component Value Date   EGFR 65 07/22/2021   Patient is on Vitamin D supplement and at goal at last check, takes  10000 IU daily:  Lab Results  Component Value Date   VD25OH 98 04/13/2021     She is on a B12 supplement;  Lab Results  Component Value Date   VITAMINB12 772  10/15/2020    Medication Review Current Outpatient Medications on File Prior to Visit  Medication Sig Dispense Refill   ALPRAZolam (XANAX) 0.5 MG tablet Take 1/2 tablet 1- 2 x /day ONLY if needed for Anxiety or Panic Attack &  limit to 5 days /week to avoid Addiction - DO NOT take with Hydrocodone or Codeine 10 tablet 0   amLODipine (NORVASC) 10 MG tablet TAKE 1 TABLET BY MOUTH ONCE DAILY FOR BLOOD PRESSURE AND FOR HEART 90 tablet 3   aspirin EC 81 MG tablet Take 81 mg by mouth at bedtime. Every other night     Calcium Carbonate (CALCIUM 500 PO) Take by mouth.     Cholecalciferol (VITAMIN D3) 5000 units CAPS Take 5,000 Units by mouth 2 (two) times daily.      diclofenac sodium (VOLTAREN) 1 % GEL Apply 2 g topically 4 (four) times daily. (Patient taking differently: Apply 2 g topically 4 (four) times daily as needed (pain).) 1 Tube 1   DULoxetine (CYMBALTA) 30 MG capsule TAKE 1 CAPSULE BY MOUTH ONCE DAILY, FOR MOOD AND CHRONIC PAIN 90 capsule 3   fluticasone furoate-vilanterol (BREO ELLIPTA) 100-25 MCG/ACT AEPB Inhale 1 puff into the lungs daily. 30 each 11   gabapentin (NEURONTIN) 400 MG capsule Take 400 mg by mouth 4 (four) times daily.     ketoconazole (NIZORAL) 2 % cream APPLY ONE APPLICATION TOPICALLY TWICE DAILY 60 g 0   Magnesium 400 MG TABS Take 400 mg by mouth daily at 2 PM.     melatonin 5 MG TABS Take 5 mg by mouth at bedtime as needed.     metoprolol tartrate (LOPRESSOR) 50 MG tablet Take 1 tablet  2 x /day(every 12 hours)  for BP & Heart                                /                          TAKE                      BY                   MOUTH 180 tablet 3   Multiple Vitamins-Minerals (MULTIVITAMIN WITH MINERALS) tablet Take 1 tablet by mouth daily.     nitroGLYCERIN (NITROSTAT) 0.4 MG SL tablet Place 1 tablet (0.4 mg total) under the tongue every 5 (five) minutes as needed for chest pain. 90 tablet 3   omeprazole (PRILOSEC) 40 MG capsule Take  1 capsule 2 x /day to Prevent  Indigestion & Heartburn 180 capsule 3   promethazine (PHENERGAN) 25 MG tablet Take  1 tablet  4 x/day  if needed for Nausea & Vomiting 120 tablet 0   senna-docusate (SENOKOT-S) 8.6-50 MG tablet Take 2 tablets by mouth 2 (two) times daily. 60 tablet 1   sucralfate (CARAFATE) 1 g tablet TAKE 1 TABLET BY MOUTH 4 TIMES DAILY BEFORE MEAL(S) AND AT BEDTIME FOR INDIGESTION AND FOR HEARTBURN 360 tablet 3   trospium (SANCTURA) 20 MG tablet Take 20 mg by mouth 2 (two) times daily.     vitamin B-12 (  CYANOCOBALAMIN) 1000 MCG tablet Take 5,000 mcg by mouth daily.     No current facility-administered medications on file prior to visit.    Current Problems (verified) Patient Active Problem List   Diagnosis Date Noted   Iron deficiency 10/16/2020   History of adenomatous polyp of colon 07/21/2020   History of fundoplication    Senile purpura ([place]) 07/05/2019   Abnormal glucose (prediabetes) 03/01/2018   FHx: heart disease 03/01/2018   CKD (chronic kidney disease) stage 2, GFR 60-89 ml/min 09/27/2017   Chronic bronchitis ([place]) 08/09/2017   Atherosclerosis of aorta ([place]) by Lumbar CYT scan 01/29/2021 04/05/2017   Odynophagia    Spinal stenosis, lumbar region, with neurogenic claudication 05/29/2015   DDD (degenerative disc disease), lumbar 04/21/2015   Hyperlipidemia, mixed 11/24/2014   Gastroesophageal reflux disease 11/24/2014   Depression, major, in partial remission ([place]) 09/26/2014   Chronic pain syndrome 09/26/2014   Coronary artery disease involving native heart without angina pectoris    Osteoporosis 02/10/2014   Vitamin D deficiency 08/14/2013   MGUS (monoclonal gammopathy of unknown significance) 03/05/2013   ESOPHAGEAL STRICTURE 08/28/2008   Mild intermittent asthma without complication 25/63/8937   Incarcerated recurrent paraesophageal hiatal hernia s/p lap redo repair [id number] 01/31/2008   Essential hypertension 07/25/2007    Screening Tests Immunization History  Administered  Date(s) Administered   DT (Pediatric) 05/06/2014   Influenza Split 02/07/2011   Influenza Whole 02/09/2009, 01/19/2010, 02/13/2012   Influenza, High Dose Seasonal PF 03/22/2013, 01/15/2014, 02/27/2015, 01/14/2016, 01/18/2017, 02/14/2018, 02/14/2019, 03/16/2020, 02/05/2021   PFIZER(Purple Top)SARS-COV-2 Vaccination 05/25/2019, 06/15/2019, 03/24/2020   Pneumococcal Conjugate-13 05/06/2014   Pneumococcal Polysaccharide-23 02/09/2011   Pneumococcal-Unspecified 02/06/2010, 02/09/2012   Td 05/10/2003   Health Maintenance  Topic Date Due   Zoster Vaccines- Shingrix (1 of 2) Never done   COVID-19 Vaccine (4 - Booster for Pfizer series) 05/19/2020   INFLUENZA VACCINE  12/07/2021   MAMMOGRAM  09/10/2022   TETANUS/TDAP  04/24/2024   Pneumonia Vaccine 71+ Years old  Completed   DEXA SCAN  Completed   HPV VACCINES  Aged Out   COLONOSCOPY (Pts 45-4yr Insurance coverage will need to be confirmed)  Discontinued    Last colonoscopy: 05/2017, adenomatous polyp, Dr. AHavery Moros 3 year recall but contacted GI and was advised no further, DONE EGD 07/2019, candida esophagitis Last mammogram: 09/2020 , wants to reduce to every other year DEXA 11/2018 L fem T -2.6, continue prolia per Dr. DEstanislado Pandy has scheduled 01/2022  Shingles: check at pharmacy   Names of Other Physician/Practitioners you currently use: 1. [facility]- here for primary care 2. Dr. GDelman Cheadle eye doctor, last visit 07/2021 3. No dentist, dentures.   4. Derm Dr. GPearline Cables goes annually, has upcoming.   Patient Care Team: MUnk Pinto MD as PCP - General (Internal Medicine) SJerline Pain MD as PCP - Cardiology (Cardiology) KInda Castle MD (Inactive) as Consulting Physician (Gastroenterology) ARigoberto Noel MD as Consulting Physician (Pulmonary Disease) MLarey Dresser MD as Consulting Physician (Cardiology) GMichael Boston MD as Consulting Physician (General Surgery) NJessy Oto MD  as Consulting Physician (Orthopedic Surgery) GPhilemon Kingdom MD as Consulting Physician (Endocrinology) MCurt Bears MD as Consulting Physician (Oncology) ERush Landmark RLake[facility] Health Systemas Pharmacist (Pharmacist)  Allergies Allergies  Allergen Reactions   Ace Inhibitors Cough    Per Dr WMelvyn Novaspulmonology 2013   Aspartame Diarrhea and Nausea And Vomiting   Atorvastatin Other (See Comments)    Muscle aches   Biaxin [Clarithromycin] Other (See Comments)  Unknown reaction : PATIENT CAN TOLERATE Z-PAK.  Is written on patient's paper chart.   Daliresp [Roflumilast] Nausea And Vomiting   Erythromycin Other (See Comments)    Unknown reaction: PATIENT CAN TOLERATE Z-PAK.  It is written on patient's paper chart.   Levofloxacin Nausea And Vomiting    SURGICAL HISTORY She  has a past surgical history that includes Back surgery; Bladder repair; Hemorrhoid surgery; Cholecystectomy; Appendectomy; Total abdominal hysterectomy (1975); Eye surgery (11/2000); Hiatal hernia repair (06/03/2011); Laparoscopic Nissen fundoplication (1/77/9390); Lumbar laminectomy/decompression microdiscectomy (Right, 06/26/2012); Esophagogastroduodenoscopy (N/A, 03/15/2013); Esophageal manometry (N/A, 03/25/2013); Hiatal hernia repair (N/A, 04/24/2013); Insertion of mesh (N/A, 04/24/2013); Laparoscopic lysis of adhesions (N/A, 04/24/2013); Fixation kyphoplasty lumbar spine (X 2); Coronary angioplasty (11/16/2001); Lumbar laminectomy/decompression microdiscectomy (N/A, 05/29/2015); Lumbar fusion (12/07/2015); Esophagogastroduodenoscopy (N/A, 12/25/2015); IR KYPHO THORACIC WITH BONE BIOPSY (09/29/2017); IR KYPHO THORACIC WITH BONE BIOPSY (11/03/2017); Upper gastrointestinal endoscopy; IR KYPHO THORACIC WITH BONE BIOPSY (02/14/2019); and IR KYPHO THORACIC WITH BONE BIOPSY (04/02/2019). FAMILY HISTORY Her family history includes Arthritis in her brother; Brain cancer in her brother; Colon cancer (age of onset: 78) in her mother; Heart attack (age of  onset: 33) in her father; Heart disease in her brother; Kidney disease in her daughter; Ulcers in her brother. SOCIAL HISTORY She  reports that she has never smoked. She has never used smokeless tobacco. She reports that she does not drink alcohol and does not use drugs.   Review of Systems  Constitutional:  Negative for malaise/fatigue and weight loss.  HENT:  Negative for hearing loss and tinnitus.   Eyes:  Negative for blurred vision and double vision.  Respiratory:  Negative for cough, sputum production, shortness of breath and wheezing.   Cardiovascular:  Negative for chest pain, palpitations, orthopnea, claudication and leg swelling.  Gastrointestinal:  Negative for abdominal pain, blood in stool, constipation, diarrhea, heartburn, melena, nausea and vomiting.  Genitourinary: Negative.  Negative for dysuria, flank pain, frequency (improved with medication), hematuria and urgency.  Musculoskeletal:  Positive for back pain (chronic, lumbar). Negative for joint pain and myalgias.  Skin:  Negative for rash.  Neurological:  Negative for dizziness, tingling, sensory change, weakness and headaches.  Endo/Heme/Allergies:  Negative for polydipsia.  Psychiatric/Behavioral: Negative.  Negative for depression, substance abuse and suicidal ideas. The patient is not nervous/anxious and does not have insomnia.   All other systems reviewed and are negative.    Objective:   Today's Vitals   10/28/21 0956  BP: 138/80  Pulse: 77  Temp: 97.8 F (36.6 C)  SpO2: 97%  Weight: 151 lb 9.6 oz (68.8 kg)  Height: _0  (1.575 m)   Body mass index is 27.73 kg/m.  General appearance: alert, no distress, WD/WN,  female HEENT: normocephalic, sclerae anicteric, TMs pearly, nares patent, no discharge or erythema, pharynx normal Oral cavity: MMM, no lesions, dentures Neck: supple, no lymphadenopathy, no thyromegaly, no masses Heart: RRR, normal S1, S2, no murmurs Lungs: CTA bilaterally, no wheezes,  rhonchi. She does have some coarse bronchial sounds bilaterally and rales scattered. Abdomen: +bs, rounded,, soft, non tender, no palpable masses, no hepatomegaly, no splenomegaly Musculoskeletal: nontender, no swelling, no obvious deformity. Extremities: no edema, no cyanosis, no clubbing. She does have scattered small ecchymoses Pulses: 2+ symmetric, upper and lower extremities, normal cap refill Neurological: alert, oriented x 3, CN2-12 intact, strength 4/5 upper extremities and lower extremities, DTRs 2+ throughout, no cerebellar signs, gait slow steady with cane Psychiatric: normal affect, behavior normal, pleasant   EKG: NSR, NSCPT  Caryl Pina  Talmadge Coventry, NP   10/28/2021

## 2021-10-28 NOTE — Patient Instructions (Signed)
Ms. Harnish , Thank you for taking time to come for your Annual Wellness Visit. I appreciate your ongoing commitment to your health goals. Please review the following plan we discussed and let me know if I can assist you in the future.   These are the goals we discussed:  Goals      DIET - INCREASE WATER INTAKE     4-5 bottles daily      Exercise 150 min/wk Moderate Activity     LDL CALC < 100        This is a list of the screening recommended for you and due dates:  Health Maintenance  Topic Date Due   Zoster (Shingles) Vaccine (1 of 2) Never done   COVID-19 Vaccine (4 - Booster for Pfizer series) 11/13/2021*   Flu Shot  12/07/2021   Mammogram  09/10/2022   Tetanus Vaccine  04/24/2024   Pneumonia Vaccine  Completed   DEXA scan (bone density measurement)  Completed   HPV Vaccine  Aged Out   Colon Cancer Screening  Discontinued  *Topic was postponed. The date shown is not the original due date.       Zoster Vaccine, Recombinant injection What is this medication? ZOSTER VACCINE (ZOS ter vak SEEN) is a vaccine used to reduce the risk of getting shingles. This vaccine is not used to treat shingles or nerve pain from shingles. This medicine may be used for other purposes; ask your health care provider or pharmacist if you have questions. COMMON BRAND NAME(S): [facility] What should I tell my care team before I take this medication? They need to know if you have any of these conditions: cancer immune system problems an unusual or allergic reaction to Zoster vaccine, other medications, foods, dyes, or preservatives pregnant or trying to get pregnant breast-feeding How should I use this medication? This vaccine is injected into a muscle. It is given by a health care provider. A copy of Vaccine Information Statements will be given before each vaccination. Be sure to read this information carefully each time. This sheet may change often. Talk to your health care provider about the  use of this vaccine in children. This vaccine is not approved for use in children. Overdosage: If you think you have taken too much of this medicine contact a poison control center or emergency room at once. NOTE: This medicine is only for you. Do not share this medicine with others. What if I miss a dose? Keep appointments for follow-up (booster) doses. It is important not to miss your dose. Call your health care provider if you are unable to keep an appointment. What may interact with this medication? medicines that suppress your immune system medicines to treat cancer steroid medicines like prednisone or cortisone This list may not describe all possible interactions. Give your health care provider a list of all the medicines, herbs, non-prescription drugs, or dietary supplements you use. Also tell them if you smoke, drink alcohol, or use illegal drugs. Some items may interact with your medicine. What should I watch for while using this medication? Visit your health care provider regularly. This vaccine, like all vaccines, may not fully protect everyone. What side effects may I notice from receiving this medication? Side effects that you should report to your doctor or health care professional as soon as possible: allergic reactions (skin rash, itching or hives; swelling of the face, lips, or tongue) trouble breathing Side effects that usually do not require medical attention (report these to your doctor  or health care professional if they continue or are bothersome): chills headache fever nausea pain, redness, or irritation at site where injected tiredness vomiting This list may not describe all possible side effects. Call your doctor for medical advice about side effects. You may report side effects to FDA at 1-800-FDA-1088. Where should I keep my medication? This vaccine is only given by a health care provider. It will not be stored at home. NOTE: This sheet is a summary. It may not  cover all possible information. If you have questions about this medicine, talk to your doctor, pharmacist, or health care provider.  2023 Elsevier/Gold Standard (2021-03-26 00:00:00)

## 2021-10-29 LAB — CBC WITH DIFFERENTIAL/PLATELET
Absolute Monocytes: 888 cells/uL (ref 200–950)
Basophils Absolute: 84 cells/uL (ref 0–200)
Basophils Relative: 0.7 %
Eosinophils Absolute: 576 cells/uL — ABNORMAL HIGH (ref 15–500)
Eosinophils Relative: 4.8 %
HCT: 43.1 % (ref 35.0–45.0)
Hemoglobin: 14.6 g/dL (ref 11.7–15.5)
Lymphs Abs: 1644 cells/uL (ref 850–3900)
MCH: 31.7 pg (ref 27.0–33.0)
MCHC: 33.9 g/dL (ref 32.0–36.0)
MCV: 93.5 fL (ref 80.0–100.0)
MPV: 10.6 fL (ref 7.5–12.5)
Monocytes Relative: 7.4 %
Neutro Abs: 8808 cells/uL — ABNORMAL HIGH (ref 1500–7800)
Neutrophils Relative %: 73.4 %
Platelets: 324 10*3/uL (ref 140–400)
RBC: 4.61 10*6/uL (ref 3.80–5.10)
RDW: 12.6 % (ref 11.0–15.0)
Total Lymphocyte: 13.7 %
WBC: 12 10*3/uL — ABNORMAL HIGH (ref 3.8–10.8)

## 2021-10-29 LAB — COMPLETE METABOLIC PANEL WITH GFR
AG Ratio: 1.5 (calc) (ref 1.0–2.5)
ALT: 14 U/L (ref 6–29)
AST: 15 U/L (ref 10–35)
Albumin: 4.1 g/dL (ref 3.6–5.1)
Alkaline phosphatase (APISO): 59 U/L (ref 37–153)
BUN: 14 mg/dL (ref 7–25)
CO2: 24 mmol/L (ref 20–32)
Calcium: 9.5 mg/dL (ref 8.6–10.4)
Chloride: 104 mmol/L (ref 98–110)
Creat: 0.9 mg/dL (ref 0.60–0.95)
Globulin: 2.7 g/dL (calc) (ref 1.9–3.7)
Glucose, Bld: 96 mg/dL (ref 65–99)
Potassium: 4.3 mmol/L (ref 3.5–5.3)
Sodium: 140 mmol/L (ref 135–146)
Total Bilirubin: 0.7 mg/dL (ref 0.2–1.2)
Total Protein: 6.8 g/dL (ref 6.1–8.1)
eGFR: 63 mL/min/{1.73_m2} (ref >=60)

## 2021-10-29 LAB — URINALYSIS, ROUTINE W REFLEX MICROSCOPIC
Bilirubin Urine: NEGATIVE
Glucose, UA: NEGATIVE
Hgb urine dipstick: NEGATIVE
Ketones, ur: NEGATIVE
Leukocytes,Ua: NEGATIVE
Nitrite: NEGATIVE
Protein, ur: NEGATIVE
Specific Gravity, Urine: 1.009 (ref 1.001–1.035)
pH: 7 (ref 5.0–8.0)

## 2021-10-29 LAB — LIPID PANEL
Cholesterol: 157 mg/dL (ref <200)
HDL: 63 mg/dL (ref >=50)
LDL Cholesterol (Calc): 77 mg/dL (calc)
Non-HDL Cholesterol (Calc): 94 mg/dL (calc) (ref <130)
Total CHOL/HDL Ratio: 2.5 (calc) (ref <5.0)
Triglycerides: 86 mg/dL (ref <150)

## 2021-10-29 LAB — HEMOGLOBIN A1C
Hgb A1c MFr Bld: 5.9 % of total Hgb — ABNORMAL HIGH (ref <5.7)
Mean Plasma Glucose: 123 mg/dL
eAG (mmol/L): 6.8 mmol/L

## 2021-10-29 LAB — MICROALBUMIN / CREATININE URINE RATIO
Creatinine, Urine: 56 mg/dL (ref 20–275)
Microalb Creat Ratio: 14 mcg/mg creat (ref <30)
Microalb, Ur: 0.8 mg/dL

## 2021-10-29 LAB — TSH: TSH: 0.9 mIU/L (ref 0.40–4.50)

## 2021-10-29 LAB — VITAMIN D 25 HYDROXY (VIT D DEFICIENCY, FRACTURES): Vit D, 25-Hydroxy: 92 ng/mL (ref 30–100)

## 2021-10-29 LAB — MAGNESIUM: Magnesium: 2 mg/dL (ref 1.5–2.5)

## 2021-11-03 ENCOUNTER — Ambulatory Visit: Payer: PPO | Admitting: Physician Assistant

## 2021-11-03 DIAGNOSIS — Z9049 Acquired absence of other specified parts of digestive tract: Secondary | ICD-10-CM

## 2021-11-03 DIAGNOSIS — E559 Vitamin D deficiency, unspecified: Secondary | ICD-10-CM

## 2021-11-03 DIAGNOSIS — Z8639 Personal history of other endocrine, nutritional and metabolic disease: Secondary | ICD-10-CM

## 2021-11-03 DIAGNOSIS — Z9889 Other specified postprocedural states: Secondary | ICD-10-CM

## 2021-11-03 DIAGNOSIS — Z8719 Personal history of other diseases of the digestive system: Secondary | ICD-10-CM

## 2021-11-03 DIAGNOSIS — Z8781 Personal history of (healed) traumatic fracture: Secondary | ICD-10-CM

## 2021-11-03 DIAGNOSIS — M19041 Primary osteoarthritis, right hand: Secondary | ICD-10-CM

## 2021-11-03 DIAGNOSIS — M5136 Other intervertebral disc degeneration, lumbar region: Secondary | ICD-10-CM

## 2021-11-03 DIAGNOSIS — S3282XS Multiple fractures of pelvis without disruption of pelvic ring, sequela: Secondary | ICD-10-CM

## 2021-11-03 DIAGNOSIS — M47819 Spondylosis without myelopathy or radiculopathy, site unspecified: Secondary | ICD-10-CM

## 2021-11-03 DIAGNOSIS — Z5181 Encounter for therapeutic drug level monitoring: Secondary | ICD-10-CM

## 2021-11-03 DIAGNOSIS — M170 Bilateral primary osteoarthritis of knee: Secondary | ICD-10-CM

## 2021-11-03 DIAGNOSIS — Z8709 Personal history of other diseases of the respiratory system: Secondary | ICD-10-CM

## 2021-11-03 DIAGNOSIS — Z8659 Personal history of other mental and behavioral disorders: Secondary | ICD-10-CM

## 2021-11-03 DIAGNOSIS — M810 Age-related osteoporosis without current pathological fracture: Secondary | ICD-10-CM

## 2021-11-03 DIAGNOSIS — Z8679 Personal history of other diseases of the circulatory system: Secondary | ICD-10-CM

## 2021-11-03 DIAGNOSIS — Z8669 Personal history of other diseases of the nervous system and sense organs: Secondary | ICD-10-CM

## 2021-11-16 NOTE — Telephone Encounter (Signed)
Left VM requesting return call to r/s appt to discuss other treatment option. Patient was no-show to appt.  Knox Saliva, PharmD, MPH, BCPS, CPP Clinical Pharmacist (Rheumatology and Pulmonology)

## 2021-11-17 ENCOUNTER — Ambulatory Visit (INDEPENDENT_AMBULATORY_CARE_PROVIDER_SITE_OTHER): Payer: PPO | Admitting: Internal Medicine

## 2021-11-17 ENCOUNTER — Encounter: Payer: Self-pay | Admitting: Internal Medicine

## 2021-11-17 VITALS — BP 122/66 | HR 67 | Temp 96.9°F | Ht 62.0 in | Wt 147.0 lb

## 2021-11-17 DIAGNOSIS — R5381 Other malaise: Secondary | ICD-10-CM | POA: Diagnosis not present

## 2021-11-17 DIAGNOSIS — R54 Age-related physical debility: Secondary | ICD-10-CM | POA: Diagnosis not present

## 2021-11-17 DIAGNOSIS — Z79899 Other long term (current) drug therapy: Secondary | ICD-10-CM

## 2021-11-17 DIAGNOSIS — R5383 Other fatigue: Secondary | ICD-10-CM | POA: Diagnosis not present

## 2021-11-17 MED ORDER — METHYLPREDNISOLONE ACETATE 40 MG/ML IJ SUSP
40.0000 mg | INTRAMUSCULAR | Status: AC | PRN
  Administered 2021-08-12: 40 mg via INTRA_ARTICULAR

## 2021-11-17 MED ORDER — BUPIVACAINE HCL 0.5 % IJ SOLN
6.0000 mL | INTRAMUSCULAR | Status: AC | PRN
  Administered 2021-08-12: 6 mL via INTRA_ARTICULAR

## 2021-11-17 MED ORDER — LIDOCAINE HCL 1 % IJ SOLN
3.0000 mL | INTRAMUSCULAR | Status: AC | PRN
  Administered 2021-08-12: 3 mL

## 2021-11-17 NOTE — Patient Instructions (Addendum)
Fatigue  If you have fatigue, you feel tired all the time and have a lack of energy or a lack of motivation. Fatigue may make it difficult to start or complete tasks because of exhaustion. Occasional or mild fatigue is often a normal response to activity or life. However, long-term (chronic) or extreme fatigue may be a symptom of a medical condition such as: Depression. Not having enough red blood cells or hemoglobin in the blood (anemia). A problem with a small gland located in the lower front part of the neck (thyroid disorder). Rheumatologic conditions. These are problems related to the body's defense system (immune system). Infections, especially certain viral infections. Fatigue can also lead to negative health outcomes over time. Follow these instructions at home: Medicines Take over-the-counter and prescription medicines only as told by your health care provider. Take a multivitamin if told by your health care provider. Do not use herbal or dietary supplements unless they are approved by your health care provider. Eating and drinking  Avoid heavy meals in the evening. Eat a well-balanced diet, which includes lean proteins, whole grains, plenty of fruits and vegetables, and low-fat dairy products. Avoid eating or drinking too many products with caffeine in them. Avoid alcohol. Drink enough fluid to keep your urine pale yellow. Activity  Exercise regularly, as told by your health care provider. Use or practice techniques to help you relax, such as yoga, tai chi, meditation, or massage therapy. Lifestyle Change situations that cause you stress. Try to keep your work and personal schedules in balance. Do not use recreational or illegal drugs. General instructions Monitor your fatigue for any changes. Go to bed and get up at the same time every day. Avoid fatigue by pacing yourself during the day and getting enough sleep at night. Maintain a healthy weight. Contact a health care  provider if: Your fatigue does not get better. You have a fever. You suddenly lose or gain weight. You have headaches. You have trouble falling asleep or sleeping through the night. You feel angry, guilty, anxious, or sad. You have swelling in your legs or another part of your body. Get help right away if: You feel confused, feel like you might faint, or faint. Your vision is blurry or you have a severe headache. You have severe pain in your abdomen, your back, or the area between your waist and hips (pelvis). You have chest pain, shortness of breath, or an irregular or fast heartbeat. You are unable to urinate, or you urinate less than normal. You have abnormal bleeding from the rectum, nose, lungs, nipples, or, if you are female, the vagina. You vomit blood. You have thoughts about hurting yourself or others. These symptoms may be an emergency. Get help right away. Call 911. Do not wait to see if the symptoms will go away. Do not drive yourself to the hospital. Get help right away if you feel like you may hurt yourself or others, or have thoughts about taking your own life. Go to your nearest emergency room or: Call 911. Call the [facility] at ([phone] or 988. This is open 24 hours a day. Text the Crisis Text Line at (214) 110-2947. Summary If you have fatigue, you feel tired all the time and have a lack of energy or a lack of motivation. Fatigue may make it difficult to start or complete tasks because of exhaustion. Long-term (chronic) or extreme fatigue may be a symptom of a medical condition. Exercise regularly, as told by your health care  provider. Change situations that cause you stress. Try to keep your work and personal schedules in balance.  +++++++++++++++++++++++ Deconditioning Deconditioning refers to the changes in the body that occur during a period of time when you are not active (inactivity). The changes happen in the heart, lungs, and  muscles. They make you feel tired and weak (fatigued) and decrease your ability to be active. The three stages of deconditioning include: Mild deconditioning. This is a change in your ability to do your usual exercise activities, such as running, biking, or swimming. Moderate deconditioning. This is a change in your ability to do normal everyday activities, such as walking, shopping for groceries, and doing chores. Severe deconditioning. In this stage, you may not be able to do minimal activity or usual self-care. What are the causes? Deconditioning can occur after only a few days of inactivity. The longer the period of inactivity, the more severe the deconditioning will be, and the longer it will take to return to your previous level of functioning. It can take three times longer to recover than the time period of inactivity. Deconditioning is caused by inactivity, often due to: Illnesses, such as cancer, stroke, heart attack, fibromyalgia, and chronic fatigue syndrome. Injuries, especially back injuries, broken bones, and injuries to soft tissues, such as ligaments and tendons. Hospitalization, even for just a day or two. Pregnancy, especially if long periods of bed rest are needed. What increases the risk? The following factors may make you more likely to develop this condition: Having obesity or poor nutrition. Having poor mobility before the period of inactivity. Being an older adult. Having depression. Having problems with thinking and learning (cognitive impairment). What are the signs or symptoms? Symptoms of this condition include: Weakness and tiredness. Shortness of breath with minor physical effort (exertion). A heartbeat that is faster than normal. You may not notice this without taking your pulse. Pain or discomfort with activity. Decreased strength, endurance, and balance. Difficulty doing your usual forms of exercise. Difficulty doing activities of daily living, such as  grocery shopping or chores. You may also have problems walking around the house and doing basic self-care, such as getting to the bathroom, preparing meals, or doing laundry. How is this diagnosed? This condition is diagnosed based on your medical history and a physical exam. During the physical exam, your health care provider will check for signs of deconditioning, such as: Decreased size of muscles. Decreased strength. Trouble with balance. Shortness of breath or a heart rate that is faster than normal after minor exertion. How is this treated? Treatment for this condition involves an exercise program in which activity is increased slowly. Your health care provider will tell you which exercises are right for you. The exercise program will likely include: Aerobic exercise. This type of exercise helps improve the functioning of the heart, lungs, and muscles. Strength training. This type of exercise helps increase muscle size and strength. Both of these types of exercise will improve your endurance. You may be referred to a physical therapist who can create a safe strengthening program for you to follow. Follow these instructions at home: Eating and drinking  Eat a healthy, well-balanced diet. This includes: Proteins, such as lean meats and fish, to build muscles. Fresh fruits and vegetables. Carbohydrates, such as whole grains, to boost energy. Drink enough fluid to keep your urine pale yellow. Activity Follow the exercise program that is recommended by your health care provider or physical therapist. Do not increase your exercise any faster than directed.  General instuctions Take over-the-counter and prescription medicines only as told by your health care provider. Do not use any products that contain nicotine or tobacco. These products include cigarettes, chewing tobacco, and vaping devices, such as e-cigarettes. If you need help quitting, ask your health care provider. Keep all follow-up  visits. This is important. Contact a health care provider if: You are not able to do the recommended exercise program. You are becoming more and more tired and weak. You become light-headed when rising to a sitting or standing position. Your level of endurance decreases after it has improved. Get help right away if: You have chest pain. You are very short of breath. You have any episodes of fainting. These symptoms may be an emergency. Get help right away. Call 911. Do not wait to see if the symptoms will go away. Do not drive yourself to the hospital. Summary Deconditioning refers to the changes in the body that occur during a period of inactivity. Deconditioning happens in the heart, lungs, and muscles. The changes make you feel tired and weak and decrease your ability to be active. Treatment for deconditioning involves an exercise program in which activity is increased slowly. This information is not intended to replace advice given to you by your health care provider. Make sure you discuss any questions you have with your health care provider. Document Revised: 02/15/2021 Document Reviewed: 02/15/2021 Elsevier Patient Education  [employer].

## 2021-11-17 NOTE — Progress Notes (Signed)
Future Appointments  Date Time Provider Department  11/17/2021  2:30 PM [name], MD [facility]  12/01/2021 10:30 AM Jessy Oto, MD [facility]  03/03/2022                  4 mo ov 10:30 AM [name], MD [facility]  07/22/2022                   wellness  9:30 AM Darrol Jump, NP [facility]  10/31/2022                   cpe 10:00 AM Alycia Rossetti, NP [facility]    History of Present Illness:      Patient is a delightful MWF presenting with c/o feeling bad for 1 month and note that she had her annual CPE 3 weeks ago on 10/28/2021  and labs found no sig problems except sl elevated WBC  12,000 .  She has no specific Respiratory, Cardiac, GI or GU sx's.   Medications   Current Outpatient Medications (Cardiovascular):    amLODipine (NORVASC) 10 MG tablet, TAKE 1 TABLET BY MOUTH ONCE DAILY FOR BLOOD PRESSURE AND FOR HEART   metoprolol tartrate (LOPRESSOR) 50 MG tablet, Take 1 tablet  2 x /day(every 12 hours)  for BP & Heart                                /                          TAKE                      BY                   MOUTH   nitroGLYCERIN (NITROSTAT) 0.4 MG SL tablet, Place 1 tablet (0.4 mg total) under the tongue every 5 (five) minutes as needed for chest pain.  Current Outpatient Medications (Respiratory):    albuterol (VENTOLIN HFA) 108 (90 Base) MCG/ACT inhaler, INHALE 2 PUFFS BY MOUTH EVERY 6 HOURS AS NEEDED FOR WHEEZING FOR SHORTNESS OF BREATH   fluticasone furoate-vilanterol (BREO ELLIPTA) 100-25 MCG/ACT AEPB, Inhale 1 puff into the lungs daily.   promethazine (PHENERGAN) 25 MG tablet, Take  1 tablet  4 x/day  if needed for Nausea & Vomiting  Current Outpatient Medications (Analgesics):    aspirin EC 81 MG tablet, Take 81 mg by mouth at bedtime. Every other night  Current Outpatient Medications (Hematological):    vitamin B-12 (CYANOCOBALAMIN) 1000 MCG tablet, Take 5,000 mcg by mouth daily.  Current Outpatient Medications (Other):    ALPRAZolam  (XANAX) 0.5 MG tablet, Take 1/2 tablet 1- 2 x /day ONLY if needed for Anxiety or Panic Attack &  limit to 5 days /week to avoid Addiction - DO NOT take with Hydrocodone or Codeine   Calcium Carbonate (CALCIUM 500 PO), Take by mouth.   Cholecalciferol (VITAMIN D3) 5000 units CAPS, Take 5,000 Units by mouth 2 (two) times daily.    diclofenac sodium (VOLTAREN) 1 % GEL, Apply 2 g topically 4 (four) times daily. (Patient taking differently: Apply 2 g topically 4 (four) times daily as needed (pain).)   DULoxetine (CYMBALTA) 30 MG capsule, TAKE 1 CAPSULE BY MOUTH ONCE DAILY, FOR MOOD AND CHRONIC PAIN   gabapentin (NEURONTIN) 400 MG capsule, Take 400 mg by mouth 4 (  four) times daily.   ketoconazole (NIZORAL) 2 % cream, APPLY ONE APPLICATION TOPICALLY TWICE DAILY   Magnesium 400 MG TABS, Take 400 mg by mouth daily at 2 PM.   melatonin 5 MG TABS, Take 5 mg by mouth at bedtime as needed.   Multiple Vitamins-Minerals (MULTIVITAMIN WITH MINERALS) tablet, Take 1 tablet by mouth daily.   omeprazole (PRILOSEC) 40 MG capsule, Take  1 capsule 2 x /day to Prevent Indigestion & Heartburn   senna-docusate (SENOKOT-S) 8.6-50 MG tablet, Take 2 tablets by mouth 2 (two) times daily.   sucralfate (CARAFATE) 1 g tablet, TAKE 1 TABLET BY MOUTH 4 TIMES DAILY BEFORE MEAL(S) AND AT BEDTIME FOR INDIGESTION AND FOR HEARTBURN   trospium (SANCTURA) 20 MG tablet, Take 20 mg by mouth 2 (two) times daily.  Problem list She has Essential hypertension; Mild intermittent asthma without complication; ESOPHAGEAL STRICTURE; Incarcerated recurrent paraesophageal hiatal hernia s/p lap redo repair [id number]; MGUS (monoclonal gammopathy of unknown significance); Vitamin D deficiency; Osteoporosis; Coronary artery disease involving native heart without angina pectoris; Depression, major, in partial remission ([place]); Chronic pain syndrome; Hyperlipidemia, mixed; Gastroesophageal reflux disease; DDD (degenerative disc disease), lumbar; Spinal stenosis,  lumbar region, with neurogenic claudication; Odynophagia; Atherosclerosis of aorta ([place]) by Lumbar CYT scan 01/29/2021; Chronic bronchitis (HCC); CKD (chronic kidney disease) stage 2, GFR 60-89 ml/min; Abnormal glucose (prediabetes); FHx: heart disease; Senile purpura ([place]); History of fundoplication; History of adenomatous polyp of colon; and Iron deficiency on their problem list.   Observations/Objective:  BP 122/66   Pulse 67   Temp (!) 96.9 F (36.1 C)   Ht '5\' 2"'$  (1.575 m)   Wt 147 lb (66.7 kg)   SpO2 99%   BMI 26.89 kg/m   HEENT - WNL. Neck - supple.  Chest - Clear equal BS. Cor - Nl HS. RRR w/o sig MGR. PP 1(+). No edema. MS- FROM w/o deformities.  Gait Nl. Neuro -  Nl w/o focal abnormalities.   Assessment and Plan:  1. Fatigue  - CBC with Differential/Platelet - COMPLETE METABOLIC PANEL WITH GFR - TSH  2. Physical deconditioning   3. Old age   86. Medication management  - CBC with Differential/Platelet - COMPLETE METABOLIC PANEL WITH GFR - TSH   Follow Up Instructions:      I discussed the assessment and treatment plan with the patient. The patient was provided an opportunity to ask questions and all were answered. The patient agreed with the plan and demonstrated an understanding of the instructions.     Kirtland Bouchard, MD

## 2021-11-18 LAB — CBC WITH DIFFERENTIAL/PLATELET
Absolute Monocytes: 696 cells/uL (ref 200–950)
Basophils Absolute: 85 cells/uL (ref 0–200)
Basophils Relative: 0.9 %
Eosinophils Absolute: 630 cells/uL — ABNORMAL HIGH (ref 15–500)
Eosinophils Relative: 6.7 %
HCT: 45.6 % — ABNORMAL HIGH (ref 35.0–45.0)
Hemoglobin: 15.1 g/dL (ref 11.7–15.5)
Lymphs Abs: 2181 cells/uL (ref 850–3900)
MCH: 31.8 pg (ref 27.0–33.0)
MCHC: 33.1 g/dL (ref 32.0–36.0)
MCV: 96 fL (ref 80.0–100.0)
MPV: 11.2 fL (ref 7.5–12.5)
Monocytes Relative: 7.4 %
Neutro Abs: 5809 cells/uL (ref 1500–7800)
Neutrophils Relative %: 61.8 %
Platelets: 341 10*3/uL (ref 140–400)
RBC: 4.75 10*6/uL (ref 3.80–5.10)
RDW: 12.5 % (ref 11.0–15.0)
Total Lymphocyte: 23.2 %
WBC: 9.4 10*3/uL (ref 3.8–10.8)

## 2021-11-18 LAB — COMPLETE METABOLIC PANEL WITH GFR
AG Ratio: 1.5 (calc) (ref 1.0–2.5)
ALT: 16 U/L (ref 6–29)
AST: 21 U/L (ref 10–35)
Albumin: 4.3 g/dL (ref 3.6–5.1)
Alkaline phosphatase (APISO): 60 U/L (ref 37–153)
BUN/Creatinine Ratio: 16 (calc) (ref 6–22)
BUN: 17 mg/dL (ref 7–25)
CO2: 28 mmol/L (ref 20–32)
Calcium: 10.5 mg/dL — ABNORMAL HIGH (ref 8.6–10.4)
Chloride: 103 mmol/L (ref 98–110)
Creat: 1.09 mg/dL — ABNORMAL HIGH (ref 0.60–0.95)
Globulin: 2.8 g/dL (calc) (ref 1.9–3.7)
Glucose, Bld: 85 mg/dL (ref 65–99)
Potassium: 4 mmol/L (ref 3.5–5.3)
Sodium: 141 mmol/L (ref 135–146)
Total Bilirubin: 0.6 mg/dL (ref 0.2–1.2)
Total Protein: 7.1 g/dL (ref 6.1–8.1)
eGFR: 50 mL/min/{1.73_m2} — ABNORMAL LOW (ref >=60)

## 2021-11-18 LAB — TSH: TSH: 0.61 mIU/L (ref 0.40–4.50)

## 2021-11-19 NOTE — Progress Notes (Signed)
<><><><><><><><><><><><><><><><><><><><><><><><><><><><><><><><><> <><><><><><><><><><><><><><><><><><><><><><><><><><><><><><><><><> -   Test results slightly outside the reference range are not unusual. If there is anything important, I will review this with you,  otherwise it is considered normal test values.  If you have further questions,  please do not hesitate to contact me at the office or via My Chart.  <><><><><><><><><><><><><><><><><><><><><><><><><><><><><><><><><> <><><><><><><><><><><><><><><><><><><><><><><><><><><><><><><><><>  - Kidney functions still look a little more  dehydrated    Very important to drink adequate amounts of fluids to prevent permanent damage    - Recommend drink at least 6 bottles (16 ounces )                                                                                                                                                                                                               of fluids /water /day = 96 Oz ~100 oz <><><><><><><><><><><><><><><><><><><><><><><><><><><><><><><><><>  -  Thyroid is Normal / OK    - 100 oz = 3,000 cc or 3 liters / day  - >>                                                        That's 1 &1/2 bottles of a 2 liter soda bottle /day !  <><><><><><><><><><><><><><><><><><><><><><><><><><><><><><><><><> <><><><><><><><><><><><><><><><><><><><><><><><><><><><><><><><><>

## 2021-11-30 ENCOUNTER — Encounter: Payer: Self-pay | Admitting: Pharmacist

## 2021-11-30 NOTE — Telephone Encounter (Addendum)
Left VM for patient requesting return call regarding Prolia and to r/s appt to discuss other osteoporosis treatment options. Letter mailed to patient today.  Knox Saliva, PharmD, MPH, BCPS, CPP Clinical Pharmacist (Rheumatology and Pulmonology)

## 2021-11-30 NOTE — Progress Notes (Unsigned)
Future Appointments  Date Time Provider Department  12/01/2021 10:30 AM [name], MD [facility]  12/14/2021 10:30 AM [name], MD [facility]  03/03/2022             4 mo ov 10:30 AM [name], MD [facility]  07/22/2022               wellness  9:30 AM Darrol Jump, NP [facility]  10/31/2022               cpe 10:00 AM Alycia Rossetti, NP [facility]    History of Present Illness:    Patient is a very nice 86 yo MWF with HTN, HLD,  CKD 3a, Pre-Diabetes, GERD and Vitamin D Deficiency who was seen 2 weeks ago for c/o ongoing nausea, anorexia and labs showed worsening dehydration despite her maintenance Prilosec /Sulcrafate & prn phenergan tablets . She endorses her long hx/o GERD & Pyrosis. She denies any dysphagia.   Medications   Current Outpatient Medications (Cardiovascular):    metoprolol tartrate (LOPRESSOR) 50 MG tablet, Take 1 tablet  2 x /day(every 12 hours)  for BP & Heart                                /                          TAKE                      BY                   MOUTH   nitroGLYCERIN (NITROSTAT) 0.4 MG SL tablet, Place 1 tablet (0.4 mg total) under the tongue every 5 (five) minutes as needed for chest pain.   amLODipine (NORVASC) 10 MG tablet, Take   1 tablet  Daily for BP & Heart                                                          /                              TAKE                              BY                             MOUTH                         ONCE DAILY  Current Outpatient Medications (Respiratory):    albuterol (VENTOLIN HFA) 108 (90 Base) MCG/ACT inhaler, INHALE 2 PUFFS BY MOUTH EVERY 6 HOURS AS NEEDED FOR WHEEZING FOR SHORTNESS OF BREATH   promethazine (PHENERGAN) 25 MG tablet, Take  1 tablet  4 x/day  if needed for Nausea & Vomiting  Current Outpatient Medications (Analgesics):    aspirin EC 81 MG tablet, Take 81 mg by mouth at bedtime. Every other night  Current Outpatient Medications (Hematological):    vitamin B-12  (  CYANOCOBALAMIN) 1000 MCG tablet, Take 5,000 mcg by mouth daily.  Current Outpatient Medications (Other):    ALPRAZolam (XANAX) 0.5 MG tablet, Take 1/2 tablet 1- 2 x /day ONLY if needed for Anxiety or Panic Attack &  limit to 5 days /week to avoid Addiction - DO NOT take with Hydrocodone or Codeine   Cholecalciferol (VITAMIN D3) 5000 units CAPS, Take 5,000 Units by mouth 2 (two) times daily.    DULoxetine (CYMBALTA) 30 MG capsule, TAKE 1 CAPSULE BY MOUTH ONCE DAILY, FOR MOOD AND CHRONIC PAIN   gabapentin (NEURONTIN) 400 MG capsule, Take 400 mg by mouth 4 (four) times daily.   ketoconazole (NIZORAL) 2 % cream, APPLY ONE APPLICATION TOPICALLY TWICE DAILY   Magnesium 400 MG TABS, Take 400 mg by mouth daily at 2 PM.   melatonin 5 MG TABS, Take 5 mg by mouth at bedtime as needed.   mirtazapine (REMERON) 30 MG tablet, Take  1/2 to 1 tablet  1 hour  before Bedtime for Appetite & Sleep   Multiple Vitamins-Minerals (MULTIVITAMIN WITH MINERALS) tablet, Take 1 tablet by mouth daily.   omeprazole (PRILOSEC) 40 MG capsule, Take  1 capsule 2 x /day to Prevent Indigestion & Heartburn   senna-docusate (SENOKOT-S) 8.6-50 MG tablet, Take 2 tablets by mouth 2 (two) times daily.   sucralfate (CARAFATE) 1 g tablet, TAKE 1 TABLET BY MOUTH 4 TIMES DAILY BEFORE MEAL(S) AND AT BEDTIME FOR INDIGESTION AND FOR HEARTBURN   trospium (SANCTURA) 20 MG tablet, Take 20 mg by mouth 2 (two) times daily.  Problem list She has Essential hypertension; Mild intermittent asthma without complication; ESOPHAGEAL STRICTURE; Incarcerated recurrent paraesophageal hiatal hernia s/p lap redo repair [id number]; MGUS (monoclonal gammopathy of unknown significance); Vitamin D deficiency; Osteoporosis; Coronary artery disease involving native heart without angina pectoris; Depression, major, in partial remission ([place]); Chronic pain syndrome; Hyperlipidemia, mixed; Gastroesophageal reflux disease; DDD (degenerative disc disease), lumbar; Spinal  stenosis, lumbar region, with neurogenic claudication; Odynophagia; Atherosclerosis of aorta ([place]) by Lumbar CYT scan 01/29/2021; Chronic bronchitis (HCC); CKD (chronic kidney disease) stage 2, GFR 60-89 ml/min; Abnormal glucose (prediabetes); FHx: heart disease; Senile purpura ([place]); History of fundoplication; History of adenomatous polyp of colon; and Iron deficiency on their problem list.   Observations/Objective:  BP (!) 144/87   Pulse 75   Temp (!) 96.2 F (35.7 C)   Resp 16   Ht '5\' 2"'$  (1.575 m)   Wt 144 lb 12.8 oz (65.7 kg)   SpO2 99%   BMI 26.48 kg/m   HEENT - WNL. Neck - supple.  Chest - Clear equal BS. Cor - Nl HS. RRR w/o sig MGR. PP 1(+). No edema. MS- FROM w/o deformities.  Gait Nl. Neuro -  Nl w/o focal abnormalities.   Assessment and Plan:  1. Abdominal pain, unspecified abdominal location  - CBC with Differential/Platelet - COMPLETE METABOLIC PANEL WITH GFR - Lipase - Amylase  2. Nausea  - CBC with Differential/Platelet - COMPLETE METABOLIC PANEL WITH GFR - Lipase - Amylase  3. Anorexia  - CBC with Differential/Platelet - COMPLETE METABOLIC PANEL WITH GFR - Lipase - Amylase  4. Gastroesophageal reflux disease, unspecified whether esophagitis present - CBC with Differential/Platelet - COMPLETE METABOLIC PANEL WITH GFR - Lipase - Amylase  5. Medication management  - CBC with Differential/Platelet - COMPLETE METABOLIC PANEL WITH GFR - Lipase - Amylase  Follow Up Instructions:        I discussed the assessment and treatment plan with the patient. The patient was provided  an opportunity to ask questions and all were answered. The patient agreed with the plan and demonstrated an understanding of the instructions.       The patient was advised to call back or seek an in-person evaluation if the symptoms worsen or if the condition fails to improve as anticipated.   Kirtland Bouchard, MD

## 2021-12-01 ENCOUNTER — Ambulatory Visit: Payer: PPO | Admitting: Specialist

## 2021-12-01 ENCOUNTER — Ambulatory Visit (INDEPENDENT_AMBULATORY_CARE_PROVIDER_SITE_OTHER): Payer: PPO | Admitting: Internal Medicine

## 2021-12-01 ENCOUNTER — Encounter: Payer: Self-pay | Admitting: Internal Medicine

## 2021-12-01 ENCOUNTER — Other Ambulatory Visit: Payer: Self-pay | Admitting: Internal Medicine

## 2021-12-01 VITALS — BP 144/87 | HR 75 | Temp 96.2°F | Resp 16 | Ht 62.0 in | Wt 144.8 lb

## 2021-12-01 DIAGNOSIS — Z79899 Other long term (current) drug therapy: Secondary | ICD-10-CM

## 2021-12-01 DIAGNOSIS — K219 Gastro-esophageal reflux disease without esophagitis: Secondary | ICD-10-CM | POA: Diagnosis not present

## 2021-12-01 DIAGNOSIS — R109 Unspecified abdominal pain: Secondary | ICD-10-CM

## 2021-12-01 DIAGNOSIS — R110 Nausea: Secondary | ICD-10-CM

## 2021-12-01 DIAGNOSIS — R630 Anorexia: Secondary | ICD-10-CM | POA: Diagnosis not present

## 2021-12-01 MED ORDER — AMLODIPINE BESYLATE 10 MG PO TABS: ORAL_TABLET | ORAL | 3 refills | Status: DC

## 2021-12-01 MED ORDER — MIRTAZAPINE 30 MG PO TABS: ORAL_TABLET | ORAL | 1 refills | Status: DC

## 2021-12-02 ENCOUNTER — Other Ambulatory Visit: Payer: Self-pay | Admitting: Internal Medicine

## 2021-12-02 DIAGNOSIS — R630 Anorexia: Secondary | ICD-10-CM

## 2021-12-02 DIAGNOSIS — K222 Esophageal obstruction: Secondary | ICD-10-CM

## 2021-12-02 DIAGNOSIS — K635 Polyp of colon: Secondary | ICD-10-CM

## 2021-12-02 LAB — CBC WITH DIFFERENTIAL/PLATELET
Absolute Monocytes: 678 cells/uL (ref 200–950)
Basophils Absolute: 92 cells/uL (ref 0–200)
Basophils Relative: 1.2 %
Eosinophils Absolute: 778 cells/uL — ABNORMAL HIGH (ref 15–500)
Eosinophils Relative: 10.1 %
HCT: 44.4 % (ref 35.0–45.0)
Hemoglobin: 14.9 g/dL (ref 11.7–15.5)
Lymphs Abs: 2033 cells/uL (ref 850–3900)
MCH: 31.8 pg (ref 27.0–33.0)
MCHC: 33.6 g/dL (ref 32.0–36.0)
MCV: 94.7 fL (ref 80.0–100.0)
MPV: 11.1 fL (ref 7.5–12.5)
Monocytes Relative: 8.8 %
Neutro Abs: 4120 cells/uL (ref 1500–7800)
Neutrophils Relative %: 53.5 %
Platelets: 333 10*3/uL (ref 140–400)
RBC: 4.69 10*6/uL (ref 3.80–5.10)
RDW: 12.6 % (ref 11.0–15.0)
Total Lymphocyte: 26.4 %
WBC: 7.7 10*3/uL (ref 3.8–10.8)

## 2021-12-02 LAB — COMPLETE METABOLIC PANEL WITH GFR
AG Ratio: 1.5 (calc) (ref 1.0–2.5)
ALT: 15 U/L (ref 6–29)
AST: 24 U/L (ref 10–35)
Albumin: 4.3 g/dL (ref 3.6–5.1)
Alkaline phosphatase (APISO): 61 U/L (ref 37–153)
BUN/Creatinine Ratio: 13 (calc) (ref 6–22)
BUN: 14 mg/dL (ref 7–25)
CO2: 32 mmol/L (ref 20–32)
Calcium: 10 mg/dL (ref 8.6–10.4)
Chloride: 104 mmol/L (ref 98–110)
Creat: 1.07 mg/dL — ABNORMAL HIGH (ref 0.60–0.95)
Globulin: 2.9 g/dL (calc) (ref 1.9–3.7)
Glucose, Bld: 117 mg/dL — ABNORMAL HIGH (ref 65–99)
Potassium: 4.1 mmol/L (ref 3.5–5.3)
Sodium: 146 mmol/L (ref 135–146)
Total Bilirubin: 0.7 mg/dL (ref 0.2–1.2)
Total Protein: 7.2 g/dL (ref 6.1–8.1)
eGFR: 51 mL/min/{1.73_m2} — ABNORMAL LOW (ref >=60)

## 2021-12-02 LAB — LIPASE: Lipase: <5 U/L — ABNORMAL LOW (ref 7–60)

## 2021-12-02 LAB — AMYLASE: Amylase: 22 U/L (ref 21–101)

## 2021-12-07 ENCOUNTER — Encounter: Payer: Self-pay | Admitting: Nurse Practitioner

## 2021-12-07 ENCOUNTER — Ambulatory Visit (INDEPENDENT_AMBULATORY_CARE_PROVIDER_SITE_OTHER): Payer: PPO | Admitting: Nurse Practitioner

## 2021-12-07 ENCOUNTER — Ambulatory Visit
Admission: RE | Admit: 2021-12-07 | Discharge: 2021-12-07 | Disposition: A | Discharge status: Home / Self Care | Payer: PPO | Source: Ambulatory Visit | Attending: Nurse Practitioner | Admitting: Nurse Practitioner

## 2021-12-07 VITALS — BP 140/70 | HR 80 | Temp 97.7°F | Ht 62.0 in | Wt 145.4 lb

## 2021-12-07 DIAGNOSIS — J069 Acute upper respiratory infection, unspecified: Secondary | ICD-10-CM | POA: Diagnosis not present

## 2021-12-07 DIAGNOSIS — R5383 Other fatigue: Secondary | ICD-10-CM

## 2021-12-07 DIAGNOSIS — R0609 Other forms of dyspnea: Secondary | ICD-10-CM

## 2021-12-07 DIAGNOSIS — R059 Cough, unspecified: Secondary | ICD-10-CM | POA: Diagnosis not present

## 2021-12-07 DIAGNOSIS — R062 Wheezing: Secondary | ICD-10-CM | POA: Diagnosis not present

## 2021-12-07 DIAGNOSIS — R0981 Nasal congestion: Secondary | ICD-10-CM

## 2021-12-07 MED ORDER — IPRATROPIUM-ALBUTEROL 0.5-2.5 (3) MG/3ML IN SOLN: 3.0000 mL | Freq: Once | RESPIRATORY_TRACT | Status: DC

## 2021-12-07 MED ORDER — PREDNISONE 20 MG PO TABS: ORAL_TABLET | ORAL | 0 refills | Status: DC

## 2021-12-07 MED ORDER — BREZTRI AEROSPHERE 160-9-4.8 MCG/ACT IN AERO: 2.0000 | INHALATION_SPRAY | Freq: Two times a day (BID) | RESPIRATORY_TRACT | 2 refills | Status: DC

## 2021-12-07 MED ORDER — DOXYCYCLINE HYCLATE 100 MG PO CAPS: 100.0000 mg | ORAL_CAPSULE | Freq: Two times a day (BID) | ORAL | 0 refills | Status: DC

## 2021-12-07 MED ORDER — PROMETHAZINE-DM 6.25-15 MG/5ML PO SYRP: 5.0000 mL | ORAL_SOLUTION | Freq: Every evening | ORAL | 1 refills | Status: DC | PRN

## 2021-12-07 MED ORDER — BENZONATATE 100 MG PO CAPS: 100.0000 mg | ORAL_CAPSULE | Freq: Three times a day (TID) | ORAL | 1 refills | Status: DC | PRN

## 2021-12-07 NOTE — Patient Instructions (Signed)
Upper Respiratory Infection, Adult ?An upper respiratory infection (URI) affects the nose, throat, and upper airways that lead to the lungs. The most common type of URI is often called the common cold. URIs usually get better on their own, without medical treatment. ?What are the causes? ?A URI is caused by a germ (virus). You may catch these germs by: ?Breathing in droplets from an infected person's cough or sneeze. ?Touching something that has the germ on it (is contaminated) and then touching your mouth, nose, or eyes. ?What increases the risk? ?You are more likely to get a URI if: ?You are very young or very old. ?You have close contact with others, such as at work, school, or a health care facility. ?You smoke. ?You have long-term (chronic) heart or lung disease. ?You have a weakened disease-fighting system (immune system). ?You have nasal allergies or asthma. ?You have a lot of stress. ?You have poor nutrition. ?What are the signs or symptoms? ?Runny or stuffy (congested) nose. ?Cough. ?Sneezing. ?Sore throat. ?Headache. ?Feeling tired (fatigue). ?Fever. ?Not wanting to eat as much as usual. ?Pain in your forehead, behind your eyes, and over your cheekbones (sinus pain). ?Muscle aches. ?Redness or irritation of the eyes. ?Pressure in the ears or face. ?How is this treated? ?URIs usually get better on their own within 7-10 days. Medicines cannot cure URIs, but your doctor may recommend certain medicines to help relieve symptoms, such as: ?Over-the-counter cold medicines. ?Medicines to reduce coughing (cough suppressants). Coughing is a type of defense against infection that helps to clear the nose, throat, windpipe, and lungs (respiratory system). Take these medicines only as told by your doctor. ?Medicines to lower your fever. ?Follow these instructions at home: ?Activity ?Rest as needed. ?If you have a fever, stay home from work or school until your fever is gone, or until your doctor says you may return to  work or school. ?You should stay home until you cannot spread the infection anymore (you are not contagious). ?Your doctor may have you wear a face mask so you have less risk of spreading the infection. ?Relieving symptoms ?Rinse your mouth often with salt water. To make salt water, dissolve ?-1 tsp (3-6 g) of salt in 1 cup (237 mL) of warm water. ?Use a cool-mist humidifier to add moisture to the air. This can help you breathe more easily. ?Eating and drinking ? ?Drink enough fluid to keep your pee (urine) pale yellow. ?Eat soups and other clear broths. ?General instructions ? ?Take over-the-counter and prescription medicines only as told by your doctor. ?Do not smoke or use any products that contain nicotine or tobacco. If you need help quitting, ask your doctor. ?Avoid being where people are smoking (avoid secondhand smoke). ?Stay up to date on all your shots (immunizations), and get the flu shot every year. ?Keep all follow-up visits. ?How to prevent the spread of infection to others ? ?Wash your hands with soap and water for at least 20 seconds. If you cannot use soap and water, use hand sanitizer. ?Avoid touching your mouth, face, eyes, or nose. ?Cough or sneeze into a tissue or your sleeve or elbow. Do not cough or sneeze into your hand or into the air. ?Contact a doctor if: ?You are getting worse, not better. ?You have any of these: ?A fever or chills. ?Brown or red mucus in your nose. ?Yellow or brown fluid (discharge)coming from your nose. ?Pain in your face, especially when you bend forward. ?Swollen neck glands. ?Pain when you swallow. ?  White areas in the back of your throat. ?Get help right away if: ?You have shortness of breath that gets worse. ?You have very bad or constant: ?Headache. ?Ear pain. ?Pain in your forehead, behind your eyes, and over your cheekbones (sinus pain). ?Chest pain. ?You have long-lasting (chronic) lung disease along with any of these: ?Making high-pitched whistling sounds when  you breathe, most often when you breathe out (wheezing). ?Long-lasting cough (more than 14 days). ?Coughing up blood. ?A change in your usual mucus. ?You have a stiff neck. ?You have changes in your: ?Vision. ?Hearing. ?Thinking. ?Mood. ?These symptoms may be an emergency. Get help right away. Call 911. ?Do not wait to see if the symptoms will go away. ?Do not drive yourself to the hospital. ?Summary ?An upper respiratory infection (URI) is caused by a germ (virus). The most common type of URI is often called the common cold. ?URIs usually get better within 7-10 days. ?Take over-the-counter and prescription medicines only as told by your doctor. ?This information is not intended to replace advice given to you by your health care provider. Make sure you discuss any questions you have with your health care provider. ?Document Revised: 11/25/2020 Document Reviewed: 11/25/2020 ?Elsevier Patient Education ? 2023 [employer] ? ?

## 2021-12-07 NOTE — Progress Notes (Addendum)
Assessment and Plan:  [name] [name] was seen today for an episodic visit.  Diagnoses and all order for this visit:  1. Upper respiratory infection with cough and congestion Considering length of symptoms will complete a CXR for any ynderlying PNA. Stay well hydrated to keep mucus thin and productive. Continue inhaler, Albuterol. Breztri sample provided. Start Benzoate during the day for cough. Use Promethazine DM at night for cough and to help you sleep. Start Doxycycline as directed. Start Prednisone taper. Report to ER or call 911 for any increase in difficulty breathing.  - benzonatate (TESSALON PERLES) 100 MG capsule; Take 1 capsule (100 mg total) by mouth 3 (three) times daily as needed for cough.  Dispense: 30 capsule; Refill: 1 - promethazine-dextromethorphan (PROMETHAZINE-DM) 6.25-15 MG/5ML syrup; Take 5 mLs by mouth at bedtime as needed for cough.  Dispense: 240 mL; Refill: 1 - doxycycline (VIBRAMYCIN) 100 MG capsule; Take 1 capsule (100 mg total) by mouth 2 (two) times daily.  Dispense: 10 capsule; Refill: 0 - DG Chest 2 View; Future  2. Other fatigue Rest. Stay well hydrated.  3. Nasal congestion Stay well hydrated to keep mucus thin and productive.  - doxycycline (VIBRAMYCIN) 100 MG capsule; Take 1 capsule (100 mg total) by mouth 2 (two) times daily.  Dispense: 10 capsule; Refill: 0  4. Wheezing Nebulizer treatment administered.  Patient tolerated well Continue Albuterol Breztri sample provided.  - ipratropium-albuterol (DUONEB) 0.5-2.5 (3) MG/3ML nebulizer solution 3 mL - doxycycline (VIBRAMYCIN) 100 MG capsule; Take 1 capsule (100 mg total) by mouth 2 (two) times daily.  Dispense: 10 capsule; Refill: 0 - predniSONE (DELTASONE) 20 MG tablet; Take 2 tabs (40 mg) for 3 days followed by 1 tab (20 mg) for 4 days.  Dispense: 10 tablet; Refill: 0  5. DOE (dyspnea on exertion)  - ipratropium-albuterol (DUONEB) 0.5-2.5 (3) MG/3ML nebulizer solution 3 mL - doxycycline  (VIBRAMYCIN) 100 MG capsule; Take 1 capsule (100 mg total) by mouth 2 (two) times daily.  Dispense: 10 capsule; Refill: 0 - predniSONE (DELTASONE) 20 MG tablet; Take 2 tabs (40 mg) for 3 days followed by 1 tab (20 mg) for 4 days.  Dispense: 10 tablet; Refill: 0  Notify office for further evaluation and treatment, questions or concerns if s/s fail to improve. The risks and benefits of my recommendations, as well as other treatment options were discussed with the patient today. Questions were answered.  Further disposition pending results of labs. Discussed med's effects and SE's.    Over 20 minutes of exam, counseling, chart review, and critical decision making was performed.   Future Appointments  Date Time Provider [place]  12/14/2021 10:30 AM [name], MD [facility] None  01/27/2022  9:30 AM [facility] DX DEXA 1 [facility]  03/03/2022 10:30 AM [name], MD [facility] None  07/22/2022  9:30 AM Darrol Jump, NP [facility] None  10/31/2022 10:00 AM Alycia Rossetti, NP [facility] None    ------------------------------------------------------------------------------------------------------------------   HPI BP (!) 140/70   Pulse 80   Temp 97.7 F (36.5 C)   Ht '5\' 2"'$  (1.575 m)   Wt 145 lb 6.4 oz (66 kg)   SpO2 92%   BMI 26.59 kg/m    [name] [name] is a 86 y.o. female who presents for evaluation of symptoms of a URI, possible sinusitis. Symptoms include congestion, cough described as productive, headache described as tension, nasal congestion, post nasal drip, shortness of breath, sneezing, and wheezing. Onset of symptoms was 2 weeks ago,  and has been unchanged since that time. Treatment to date: cough suppressants Robitussin.  She is also using Albuterol inhaler with minimal relief. She feels as though her cough has lingered since having Covid 12/2020.    She denies recent travel or exposure to others.   Past Medical History:  Diagnosis Date    Anxiety    takes Xanax daily as needed   Asthma    Albuterol daily as needed   Blood transfusion without reported diagnosis    CAD (coronary artery disease)    LHC 2003 with 40% pLAD, 30% mLAD, 100% D1 (moderate vessel), 100%  D2 (small vessel), 95% mid small OM2.  Pt had PTCA to D1;  D2 and OM2 small vessels and tx medically;  Last Myoview (2012): anterior infarct seen with scar, no ischemia. EF normal. Med Rx // Myoview (12/14):  Low risk; ant scar w/ peri-infarct ischemia; EF 55% with ant AK // Myoview 5/16: EF 55-65, ant, apical scar, no ischemia    Cataract    Chronic back pain    HNP, DDD, spinal stenosis, vertebral compression fractures.    Chronic nausea    takes Zofran daily as needed.  normal gastric emptying study 03/2013   CKD (chronic kidney disease), stage III ([place]) 09/27/2017   Constipation    felt to be functional. takes Senokot and Miralax daily.  treated with Linzess in past.    DIVERTICULOSIS, COLON 08/28/2008   Qualifier: Diagnosis of  By: [name], [name], [name]     Elevated LFTs 2015   probably med induced DILI, from Augmentin vs Pravastatin   GERD (gastroesophageal reflux disease)    hx of esophageal stricture    Hiatal hernia    Paraesophageal hernias. s/p fundoplications in 1610 and 04/2013   History of bronchitis several of yrs ago   History of colon polyps 03/2009, 03/2011   adenomatous colon polyps, no high grade dysplasia.    History of vertigo    no meds   Hyperlipidemia    takes Pravastatin daily   Hypertension    takes Amlodipine,Metoprolol,and Losartan daily   Multiple closed pelvic fractures without disruption of pelvic circle (HCC)    Nausea 03/2016   Osteoporosis    PONV (postoperative nausea and vomiting)    yrs ago   Pubic ramus fracture ([place]) 12/20/2019   Slow urinary stream    takes Rapaflo daily   TIA (transient ischemic attack) 1995   no residual effects noted   Vertebral compression fracture (HCC) 06/23/2012     Allergies  Allergen  Reactions   Ace Inhibitors Cough    Per Dr Melvyn Novas pulmonology 2013   Aspartame Diarrhea and Nausea And Vomiting   Atorvastatin Other (See Comments)    Muscle aches   Biaxin [Clarithromycin] Other (See Comments)    Unknown reaction : PATIENT CAN TOLERATE Z-PAK.  Is written on patient's paper chart.   Daliresp [Roflumilast] Nausea And Vomiting   Erythromycin Other (See Comments)    Unknown reaction: PATIENT CAN TOLERATE Z-PAK.  It is written on patient's paper chart.   Levofloxacin Nausea And Vomiting    Current Outpatient Medications on File Prior to Visit  Medication Sig   albuterol (VENTOLIN HFA) 108 (90 Base) MCG/ACT inhaler INHALE 2 PUFFS BY MOUTH EVERY 6 HOURS AS NEEDED FOR WHEEZING FOR SHORTNESS OF BREATH   ALPRAZolam (XANAX) 0.5 MG tablet Take 1/2 tablet 1- 2 x /day ONLY if needed for Anxiety or Panic Attack &  limit to 5 days /week to avoid  Addiction - DO NOT take with Hydrocodone or Codeine   amLODipine (NORVASC) 10 MG tablet Take   1 tablet  Daily for BP & Heart                                                          /                              TAKE                              BY                             MOUTH                         ONCE DAILY   aspirin EC 81 MG tablet Take 81 mg by mouth at bedtime. Every other night   Cholecalciferol (VITAMIN D3) 5000 units CAPS Take 5,000 Units by mouth 2 (two) times daily.    DULoxetine (CYMBALTA) 30 MG capsule TAKE 1 CAPSULE BY MOUTH ONCE DAILY, FOR MOOD AND CHRONIC PAIN   gabapentin (NEURONTIN) 400 MG capsule Take 400 mg by mouth 4 (four) times daily.   ketoconazole (NIZORAL) 2 % cream APPLY ONE APPLICATION TOPICALLY TWICE DAILY   Magnesium 400 MG TABS Take 400 mg by mouth daily at 2 PM.   melatonin 5 MG TABS Take 5 mg by mouth at bedtime as needed.   metoprolol tartrate (LOPRESSOR) 50 MG tablet Take 1 tablet  2 x /day(every 12 hours)  for BP & Heart                                /                          TAKE                      BY                    MOUTH   mirtazapine (REMERON) 30 MG tablet Take  1/2 to 1 tablet  1 hour  before Bedtime for Appetite & Sleep   Multiple Vitamins-Minerals (MULTIVITAMIN WITH MINERALS) tablet Take 1 tablet by mouth daily.   nitroGLYCERIN (NITROSTAT) 0.4 MG SL tablet Place 1 tablet (0.4 mg total) under the tongue every 5 (five) minutes as needed for chest pain.   omeprazole (PRILOSEC) 40 MG capsule Take  1 capsule 2 x /day to Prevent Indigestion & Heartburn   promethazine (PHENERGAN) 25 MG tablet Take  1 tablet  4 x/day  if needed for Nausea & Vomiting   senna-docusate (SENOKOT-S) 8.6-50 MG tablet Take 2 tablets by mouth 2 (two) times daily.   sucralfate (CARAFATE) 1 g tablet TAKE 1 TABLET BY MOUTH 4 TIMES DAILY BEFORE MEAL(S) AND AT BEDTIME FOR INDIGESTION AND FOR HEARTBURN   trospium (SANCTURA) 20 MG tablet Take 20 mg by mouth 2 (two) times daily.   vitamin B-12 (CYANOCOBALAMIN)  1000 MCG tablet Take 5,000 mcg by mouth daily.   No current facility-administered medications on file prior to visit.    ROS: all negative except what is noted in the HPI.    Physical Exam:  BP (!) 140/70   Pulse 80   Temp 97.7 F (36.5 C)   Ht '5\' 2"'$  (1.575 m)   Wt 145 lb 6.4 oz (66 kg)   SpO2 92%   BMI 26.59 kg/m   General Appearance: NAD.  Awake, conversant and cooperative. Eyes: PERRLA, EOMs intact.  Sclera white.  Conjunctiva without erythema. Sinuses: No frontal/maxillary tenderness.  No nasal discharge. Nares patent.  ENT/Mouth: Ext aud canals clear.  Bilateral TMs w/DOL and without erythema or bulging. Hearing intact.  Posterior pharynx without swelling or exudate.  Tonsils without swelling or erythema.  Neck: Supple.  No masses, nodules or thyromegaly. Respiratory:  Scattered wheezing throughout lung field. Effort is regular with non-labored breathing.   Cardio: RRR with no MRGs. Brisk peripheral pulses without edema.  Abdomen: Active BS in all four quadrants.  Soft and non-tender without  guarding, rebound tenderness, hernias or masses. Lymphatics: Non tender without lymphadenopathy.  Musculoskeletal: Full ROM, 5/5 strength, normal ambulation.  No clubbing or cyanosis. Skin: Appropriate color for ethnicity. Warm without rashes, lesions, ecchymosis, ulcers.  Neuro: CN II-XII grossly normal. Normal muscle tone without cerebellar symptoms and intact sensation.   Psych: AO X 3,  appropriate mood and affect, insight and judgment.     Darrol Jump, NP 2:21 PM [facility]

## 2021-12-08 ENCOUNTER — Telehealth: Payer: Self-pay | Admitting: Nurse Practitioner

## 2021-12-08 NOTE — Telephone Encounter (Signed)
PATIENT STATES THAT INSURANCE WILL NOT COVER PROMETHAZINE. IS THERE ANYTHING ELSE?

## 2021-12-10 ENCOUNTER — Other Ambulatory Visit: Payer: Self-pay | Admitting: Internal Medicine

## 2021-12-10 DIAGNOSIS — B372 Candidiasis of skin and nail: Secondary | ICD-10-CM

## 2021-12-10 MED ORDER — KETOCONAZOLE 2 % EX CREA: TOPICAL_CREAM | CUTANEOUS | 3 refills | Status: DC

## 2021-12-14 ENCOUNTER — Ambulatory Visit: Payer: PPO | Admitting: Internal Medicine

## 2021-12-14 ENCOUNTER — Encounter: Payer: Self-pay | Admitting: Internal Medicine

## 2021-12-14 DIAGNOSIS — J4520 Mild intermittent asthma, uncomplicated: Secondary | ICD-10-CM | POA: Diagnosis not present

## 2021-12-14 MED ORDER — BREZTRI AEROSPHERE 160-9-4.8 MCG/ACT IN AERO: 2.0000 | INHALATION_SPRAY | Freq: Two times a day (BID) | RESPIRATORY_TRACT | 0 refills | Status: DC

## 2021-12-14 MED ORDER — AMOXICILLIN-POT CLAVULANATE 875-125 MG PO TABS: 1.0000 | ORAL_TABLET | Freq: Two times a day (BID) | ORAL | 0 refills | Status: DC

## 2021-12-14 MED ORDER — PREDNISONE 10 MG PO TABS: ORAL_TABLET | ORAL | 0 refills | Status: DC

## 2021-12-14 MED ORDER — ALBUTEROL SULFATE (2.5 MG/3ML) 0.083% IN NEBU: 2.5000 mg | INHALATION_SOLUTION | RESPIRATORY_TRACT | 2 refills | Status: DC | PRN

## 2021-12-14 NOTE — Assessment & Plan Note (Addendum)
09/08/2017  After extensive coaching inhaler device  effectiveness =    75% > continue dulera 100 2 bid prn  - Spirometry 09/08/2017  wnl with min curvature off rx  - PFT's  09/02/2020  FEV1 1.38 (73 % ) ratio 0.77  p 11 % improvement from saba p advair 250  prior to study with DLCO  19.41 (103%) corrects to 5.38 (131%)  for alv volume and FV curve minimally concave  - 10/14/2020  After extensive coaching inhaler device,  effectiveness =    75% so try symbicort 80 2 bid    -  12/14/2021  After extensive coaching inhaler device,  effectiveness =    80%   DDX of  difficult airways management almost all start with A and  include Adherence, Ace Inhibitors, Acid Reflux, Active Sinus Disease, Alpha 1 Antitripsin deficiency, Anxiety masquerading as Airways dz,  ABPA,  Allergy(esp in young), Aspiration (esp in elderly), Adverse effects of meds,  Active smoking or vaping, A bunch of PE's (a small clot burden can't cause this syndrome unless there is already severe underlying pulm or vascular dz with poor reserve) plus two Bs  = Bronchiectasis and Beta blocker use..and one C= CHF  Adherence is always the initial "prime suspect" and is a multilayered concern that requires a "trust but verify" approach in every patient - starting with knowing how to use medications, especially inhalers, correctly, keeping up with refills and understanding the fundamental difference between maintenance and prns vs those medications only taken for a very short course and then stopped and not refilled.   - see hfa teaching - return with all meds in hand using a trust but verify approach to confirm accurate Medication  Reconciliation The principal here is that until we are certain that the  patients are doing what we've asked, it makes no sense to ask them to do more.    ? Acid (or non-acid) GERD > always difficult to exclude as up to 75% of pts in some series report no assoc GI/ Heartburn symptoms> rec continue max (24h)  acid suppression and  diet restrictions/ reviewed     ? Active sinus dz > augmentin x 10 days  ? Allergy > Prednisone x 12 day taper as 6 days not helpful and continue high dose ICS (Breztri) at least for now       Each maintenance medication was reviewed in detail including emphasizing most importantly the difference between maintenance and prns and under what circumstances the prns are to be triggered using an action plan format where appropriate.  Total time for H and P, chart review, counseling, reviewing hfa/neb  device(s) and generating customized AVS unique to this office visit / same day charting > 30 min for multiple  refractory respiratory  symptoms of uncertain etiology

## 2021-12-14 NOTE — Progress Notes (Signed)
Subjective:   Patient ID: [name] [name], female    DOB: 10-Jul-1935    MRN: [id number]     Brief patient profile:  85yowf never smoker with large hiatal hernia & recurrent asthmatic bronchitis , about twice/yr for many yrs. Lung parenchyma looks ok on Ct scans 10/2006 & 12/10. PFTs nml in 12/09 & mild airway obstruction in 08/2009.  First presented 3/09 . CT neck for thyroid imaging s/o mediastinal contour abnormal. Ct chest >> large hiatal hernia & BL upper lobe hazy , ground glass opacity.  Dec, 2010  [name] [name])  Fe def anemia requiring transfusions, small bowel AVMs suspected.  Mar 2011 Acute visit- c/o cough and wheezing >> augmentin helped, enalapril changed to diovan       11/19/2013 acute  ov/[name] [name] re:  [name] [name] since July 4th 2015  Chief Complaint  Patient presents with   Acute Visit    Pt c/o cough, congestion and sinus pressure x 1 wk. Cough is prod with minimal to moderate greenish yellow sputum.     symptoms were acute "like a head cold" then into chest, progressive, with sob with exertion but not at rest Zpak July 10  [facility] 2 weeks prior to OV   got kyphoplasty, then sinus congestion Used neb am of of with good results  rec Augmentin 875 mg take one pill twice daily  X 10 days - take at breakfast and supper with large glass of water.  It would help reduce the usual side effects (diarrhea and yeast infections) if you ate cultured yogurt at lunch.  Prednisone 10 mg take  4 each am x 2 days,   2 each am x 2 days,  1 each am x 2 days and stop  Work on inhaler technique:  relax and gently blow all the way out then take a nice smooth deep breath back in, triggering the inhaler at same time you start breathing in.  Hold for up to 5 seconds if you can.  Rinse and gargle with water when done Return in 2 weeks if not completely better   > did not return      09/08/2017 1st [name] [name] office visit/ [name] [name]  Re AB  Chief Complaint  Patient presents with   [name] Consult    Last seen  here in July 2015. She states that she is here for just "check up". Breathing is overall doing well.  She rarely uses her albuterol inhaler.   uses dulera 200 when over does it  Sleeping s resp problems  No cough  Walks at [employer] at a slow pace with a cane and not really limited by doe   rec In the event of cough/ wheeze or worse short of breath > dulera  100 or 200 up to 2 pffs every 12 hours Work on inhaler technique:   Plan B = Backup Only use your albuterol as a rescue medication Plan C = Crisis - only use your albuterol nebulizer if you first try Plan B and it fails to help > ok to use the nebulizer up to every 4 hours but if start needing it regularly call for immediate appointment    07/20/20 NP eval for acute exac rec Zpack take as directed  Mucinex DM liquid Twice daily  As needed  Cough/congestion  Prednisone taper over next week .  Albuterol inhaler As needed   Continue on BREZTRI 2 puffs Twice daily , brush/rinse/gargle after use.  Follow up with Dr. Melvyn Novas  In 6-8 weeks  with PFTs and As needed  Please contact office for sooner follow up if symptoms do not improve or worsen or seek emergency care       09/02/2020  Re-establish ov/[name] [name] re: ? Asthma maint on advair 250/50 Chief Complaint  Patient presents with   Follow-up    Had PFT today-sob worse at times, cough yellow   Dyspnea:  Steps x one flight > sob/ shops anywhere at nl pace = MMRC1 = can walk nl pace, flat grade, can't hurry or go uphills or steps s sob   Cough: assoc with sob flares   Sleeping:bed is 10 degrees/ two pillows on side SABA use: no change  02: none  Covid status:   X 3 vax rec Change advair to 250/50 one twice daily  Please schedule a follow up office visit in 6 weeks, call sooner if needed  Error:  We originally recorded advair at 500 but it was 250 so the reduction should have been to the 100/50 bid/ pt informed by phone of th error on the avs but the correct rx was submitted for the 100/50  strength   10/14/2020  f/u ov/[name] [name] re:    Asthma worse cough  actually  on 500/50 after all (very confused with names of meds and strengths) "it's whatever they gave me"  No chief complaint on file. Dyspnea: MMRC1 = can walk nl pace, flat grade, can't hurry or go uphills or steps s sob   Cough: worse with exertion esp outdoors  Sleeping: 10 degrees/ on side with 2pillows under head  SABA use: not using  02: none Covid status:   vax x 3  Rec Plan A = Automatic = Always=    dulera 100 (or Symbicort 80) Take 2 puffs first thing in am and then another 2 puffs about 12 hours later.  Work on inhaler technique:   Plan B = Backup (to supplement plan A, not to replace it) Only use your albuterol inhaler as a rescue medication      05/12/2021  f/u ov/[name] [name] re: asthma / uacs   maint on symbicort 80 2bid   Chief Complaint  Patient presents with   Follow-up    Coughing since had covid 19 in Aug 2022- prod with green to yellow sputum.   Dyspnea: typically not limited by breathing from desired activities  / limited by balance uses cane  Cough: more since covid / green in am assoc subj wheeze s sinus cc Sleeping: 10 degrees on bed blocks does fine SABA use: twice daily - misunderstood instructions 02: none Covid status:   vax x 3  and infected x one  Rec Zpak Prednisone 10 mg take  4 each am x 2 days,   2 each am x 2 days,  1 each am x 2 days and stop  Remember: Only use your albuterol as a rescue medication  Ok to try albuterol 15 min before an activity (on alternating days)  that you know would usually make you short of breath      12/14/2021  f/u ov/[name] [name] re: asthma/ uacs  flare   maint on breztri   Chief Complaint  Patient presents with   Follow-up    Pt states she is coughing with green congestion for about x1year. Pt went to PCP and was given prednisone and antibiotic.   Dyspnea:  more of balance issue, still doing [employer] shopping ok / hc parking  Cough: worse x one month with nasal  tone voice/ worse  in am still yellow green despite doxy  Sleeping: ok on bed blocks SABA use: not much but doesn't have neb  02: none      No obvious day to day or daytime variability or assoc excess/ purulent sputum or mucus plugs or hemoptysis or cp or chest tightness, subjective wheeze or overt   hb symptoms.   Sleeping  without nocturnal  or early am exacerbation  of respiratory  c/o's or need for noct saba. Also denies any obvious fluctuation of symptoms with weather or environmental changes or other aggravating or alleviating factors except as outlined above   No unusual exposure hx or h/o childhood pna/ asthma or knowledge of premature birth.  Current Allergies, Complete Past Medical History, Past Surgical History, Family History, and Social History were reviewed in [facility] record.  ROS  The following are not active complaints unless bolded Hoarseness, sore throat, dysphagia, dental problems, itching, sneezing,  nasal congestion or discharge of excess mucus or purulent secretions, ear ache,   fever, chills, sweats, unintended wt loss or wt gain, classically pleuritic or exertional cp,  orthopnea pnd or arm/hand swelling  or leg swelling, presyncope, palpitations, abdominal pain, anorexia, nausea, vomiting, diarrhea  or change in bowel habits or change in bladder habits, change in stools or change in urine, dysuria, hematuria,  rash, arthralgias, visual complaints, headache, numbness, weakness or ataxia or problems with walking or coordination,  change in mood or  memory.        Current Meds  Medication Sig   albuterol (VENTOLIN HFA) 108 (90 Base) MCG/ACT inhaler INHALE 2 PUFFS BY MOUTH EVERY 6 HOURS AS NEEDED FOR WHEEZING FOR SHORTNESS OF BREATH   ALPRAZolam (XANAX) 0.5 MG tablet Take 1/2 tablet 1- 2 x /day ONLY if needed for Anxiety or Panic Attack &  limit to 5 days /week to avoid Addiction - DO NOT take with Hydrocodone or Codeine   amLODipine (NORVASC) 10  MG tablet Take   1 tablet  Daily for BP & Heart                                                          /                              TAKE                              BY                             MOUTH                         ONCE DAILY   aspirin EC 81 MG tablet Take 81 mg by mouth at bedtime. Every other night   benzonatate (TESSALON PERLES) 100 MG capsule Take 1 capsule (100 mg total) by mouth 3 (three) times daily as needed for cough.   Budeson-Glycopyrrol-Formoterol (BREZTRI AEROSPHERE) 160-9-4.8 MCG/ACT AERO Inhale 2 puffs into the lungs in the morning and at bedtime.   Cholecalciferol (VITAMIN D3) 5000 units CAPS Take 5,000 Units by mouth 2 (two) times daily.  doxycycline (VIBRAMYCIN) 100 MG capsule Take 1 capsule (100 mg total) by mouth 2 (two) times daily.   DULoxetine (CYMBALTA) 30 MG capsule TAKE 1 CAPSULE BY MOUTH ONCE DAILY, FOR MOOD AND CHRONIC PAIN   gabapentin (NEURONTIN) 400 MG capsule Take 400 mg by mouth 4 (four) times daily.   ketoconazole (NIZORAL) 2 % cream Apply to  Ringworm rash  2 x /day   Magnesium 400 MG TABS Take 400 mg by mouth daily at 2 PM.   melatonin 5 MG TABS Take 5 mg by mouth at bedtime as needed.   metoprolol tartrate (LOPRESSOR) 50 MG tablet Take 1 tablet  2 x /day(every 12 hours)  for BP & Heart                                /                          TAKE                      BY                   MOUTH   mirtazapine (REMERON) 30 MG tablet Take  1/2 to 1 tablet  1 hour  before Bedtime for Appetite & Sleep   Multiple Vitamins-Minerals (MULTIVITAMIN WITH MINERALS) tablet Take 1 tablet by mouth daily.   nitroGLYCERIN (NITROSTAT) 0.4 MG SL tablet Place 1 tablet (0.4 mg total) under the tongue every 5 (five) minutes as needed for chest pain.   omeprazole (PRILOSEC) 40 MG capsule Take  1 capsule 2 x /day to Prevent Indigestion & Heartburn   predniSONE (DELTASONE) 20 MG tablet Take 2 tabs (40 mg) for 3 days followed by 1 tab (20 mg) for 4 days.   promethazine  (PHENERGAN) 25 MG tablet Take  1 tablet  4 x/day  if needed for Nausea & Vomiting   promethazine-dextromethorphan (PROMETHAZINE-DM) 6.25-15 MG/5ML syrup Take 5 mLs by mouth at bedtime as needed for cough.   senna-docusate (SENOKOT-S) 8.6-50 MG tablet Take 2 tablets by mouth 2 (two) times daily.   sucralfate (CARAFATE) 1 g tablet TAKE 1 TABLET BY MOUTH 4 TIMES DAILY BEFORE MEAL(S) AND AT BEDTIME FOR INDIGESTION AND FOR HEARTBURN   trospium (SANCTURA) 20 MG tablet Take 20 mg by mouth 2 (two) times daily.   vitamin B-12 (CYANOCOBALAMIN) 1000 MCG tablet Take 5,000 mcg by mouth daily.                       Objective:   Physical Exam  Wts    12/14/2021         144   05/12/2021          143  10/14/2020          150 09/02/2020        152   09/08/2017         164   11/19/13 132 lb (59.875 kg)  11/14/13 127 lb (57.607 kg)  11/05/13 125 lb (56.7 kg)     Vital signs reviewed  12/14/2021  - Note at rest 02 sats  95% on RA   General appearance:   Amb wf nad / nasal tone to voice    HEENT : Oropharynx  clear/ edentulous/    Nasal turbinates mod edema    NECK :  without  apparent JVD/ palpable Nodes/TM    LUNGS: no acc muscle use,  Min barrel  contour chest wall with bilateral  mid exp  wheeze and  without cough on insp or exp maneuvers and min  Hyperresonant  to  percussion bilaterally    CV:  RRR  no s3 or murmur or increase in P2, and no edema   ABD:  soft and nontender with pos end  insp Hoover's  in the supine position.  No bruits or organomegaly appreciated   MS:  Nl gait/ ext warm without deformities Or obvious joint restrictions  calf tenderness, cyanosis or clubbing     SKIN: warm and dry without lesions    NEURO:  alert, approp, nl sensorium with  no motor or cerebellar deficits apparent.                  Assessment & Plan:

## 2021-12-14 NOTE — Patient Instructions (Addendum)
Augmentin 875 mg take one pill twice daily  X 10 days - take at breakfast and supper with large glass of water.  It would help reduce the usual side effects (diarrhea and yeast infections) if you ate cultured yogurt at lunch.   Prednisone 10 mg take  4 each am x 2 days,   2 each am x 2 days,  1 each am x 2 days and stop   For cough/ congestion > mucinex dm 1200 mg every 12 hours as needed    Work on inhaler technique:  relax and gently blow all the way out then take a nice smooth full deep breath back in, triggering the inhaler at same time you start breathing in.  Hold breath in for at least  5 seconds if you can. Blow out breztri  thru nose. Rinse and gargle with water when done.  If mouth or throat bother you at all,  try brushing teeth/gums/tongue with arm and hammer toothpaste/ make a slurry and gargle and spit out.  - remember how golfers take practice swings   Please schedule a follow up office visit in 6 weeks, call sooner if needed - bring your inhalers

## 2021-12-30 DIAGNOSIS — M546 Pain in thoracic spine: Secondary | ICD-10-CM | POA: Diagnosis not present

## 2021-12-30 DIAGNOSIS — M4326 Fusion of spine, lumbar region: Secondary | ICD-10-CM | POA: Diagnosis not present

## 2021-12-30 DIAGNOSIS — M5451 Vertebrogenic low back pain: Secondary | ICD-10-CM | POA: Diagnosis not present

## 2022-01-12 ENCOUNTER — Ambulatory Visit (INDEPENDENT_AMBULATORY_CARE_PROVIDER_SITE_OTHER): Payer: PPO | Admitting: Nurse Practitioner

## 2022-01-12 ENCOUNTER — Encounter: Payer: Self-pay | Admitting: Nurse Practitioner

## 2022-01-12 VITALS — BP 124/62 | HR 88 | Temp 97.7°F | Ht 62.0 in | Wt 150.0 lb

## 2022-01-12 DIAGNOSIS — M546 Pain in thoracic spine: Secondary | ICD-10-CM | POA: Diagnosis not present

## 2022-01-12 DIAGNOSIS — I10 Essential (primary) hypertension: Secondary | ICD-10-CM | POA: Diagnosis not present

## 2022-01-12 MED ORDER — KETOROLAC TROMETHAMINE 60 MG/2ML IM SOLN
60.0000 mg | Freq: Once | INTRAMUSCULAR | Status: AC
  Administered 2022-01-12: 60 mg via INTRAMUSCULAR

## 2022-01-12 MED ORDER — DEXAMETHASONE SODIUM PHOSPHATE 10 MG/ML IJ SOLN
10.0000 mg | Freq: Once | INTRAMUSCULAR | Status: AC
  Administered 2022-01-12: 10 mg via INTRAMUSCULAR

## 2022-01-12 MED ORDER — PREDNISONE 20 MG PO TABS: ORAL_TABLET | ORAL | 0 refills | Status: DC

## 2022-01-12 MED ORDER — TRAMADOL HCL 50 MG PO TABS: 50.0000 mg | ORAL_TABLET | Freq: Four times a day (QID) | ORAL | 0 refills | Status: DC | PRN

## 2022-01-12 NOTE — Patient Instructions (Signed)
Acute Back Pain, Adult Acute back pain is sudden and usually short-lived. It is often caused by an injury to the muscles and tissues in the back. The injury may result from: A muscle, tendon, or ligament getting overstretched or torn. Ligaments are tissues that connect bones to each other. Lifting something improperly can cause a back strain. Wear and tear (degeneration) of the spinal disks. Spinal disks are circular tissue that provide cushioning between the bones of the spine (vertebrae). Twisting motions, such as while playing sports or doing yard work. A hit to the back. Arthritis. You may have a physical exam, lab tests, and imaging tests to find the cause of your pain. Acute back pain usually goes away with rest and home care. Follow these instructions at home: Managing pain, stiffness, and swelling Take over-the-counter and prescription medicines only as told by your health care provider. Treatment may include medicines for pain and inflammation that are taken by mouth or applied to the skin, or muscle relaxants. Your health care provider may recommend applying ice during the first 24-48 hours after your pain starts. To do this: Put ice in a plastic bag. Place a towel between your skin and the bag. Leave the ice on for 20 minutes, 2-3 times a day. Remove the ice if your skin turns bright red. This is very important. If you cannot feel pain, heat, or cold, you have a greater risk of damage to the area. If directed, apply heat to the affected area as often as told by your health care provider. Use the heat source that your health care provider recommends, such as a moist heat pack or a heating pad. Place a towel between your skin and the heat source. Leave the heat on for 20-30 minutes. Remove the heat if your skin turns bright red. This is especially important if you are unable to feel pain, heat, or cold. You have a greater risk of getting burned. Activity  Do not stay in bed. Staying in  bed for more than 1-2 days can delay your recovery. Sit up and stand up straight. Avoid leaning forward when you sit or hunching over when you stand. If you work at a desk, sit close to it so you do not need to lean over. Keep your chin tucked in. Keep your neck drawn back, and keep your elbows bent at a 90-degree angle (right angle). Sit high and close to the steering wheel when you drive. Add lower back (lumbar) support to your car seat, if needed. Take short walks on even surfaces as soon as you are able. Try to increase the length of time you walk each day. Do not sit, drive, or stand in one place for more than 30 minutes at a time. Sitting or standing for long periods of time can put stress on your back. Do not drive or use heavy machinery while taking prescription pain medicine. Use proper lifting techniques. When you bend and lift, use positions that put less stress on your back: Bend your knees. Keep the load close to your body. Avoid twisting. Exercise regularly as told by your health care provider. Exercising helps your back heal faster and helps prevent back injuries by keeping muscles strong and flexible. Work with a physical therapist to make a safe exercise program, as recommended by your health care provider. Do any exercises as told by your physical therapist. Lifestyle Maintain a healthy weight. Extra weight puts stress on your back and makes it difficult to have good   posture. Avoid activities or situations that make you feel anxious or stressed. Stress and anxiety increase muscle tension and can make back pain worse. Learn ways to manage anxiety and stress, such as through exercise. General instructions Sleep on a firm mattress in a comfortable position. Try lying on your side with your knees slightly bent. If you lie on your back, put a pillow under your knees. Keep your head and neck in a straight line with your spine (neutral position) when using electronic equipment like  smartphones or pads. To do this: Raise your smartphone or pad to look at it instead of bending your head or neck to look down. Put the smartphone or pad at the level of your face while looking at the screen. Follow your treatment plan as told by your health care provider. This may include: Cognitive or behavioral therapy. Acupuncture or massage therapy. Meditation or yoga. Contact a health care provider if: You have pain that is not relieved with rest or medicine. You have increasing pain going down into your legs or buttocks. Your pain does not improve after 2 weeks. You have pain at night. You lose weight without trying. You have a fever or chills. You develop nausea or vomiting. You develop abdominal pain. Get help right away if: You develop new bowel or bladder control problems. You have unusual weakness or numbness in your arms or legs. You feel faint. These symptoms may represent a serious problem that is an emergency. Do not wait to see if the symptoms will go away. Get medical help right away. Call your local emergency services (911 in the [place] Do not drive yourself to the hospital. Summary Acute back pain is sudden and usually short-lived. Use proper lifting techniques. When you bend and lift, use positions that put less stress on your back. Take over-the-counter and prescription medicines only as told by your health care provider, and apply heat or ice as told. This information is not intended to replace advice given to you by your health care provider. Make sure you discuss any questions you have with your health care provider. Document Revised: 07/17/2020 Document Reviewed: 07/17/2020 [employer] Patient Education  2023 [employer]  

## 2022-01-12 NOTE — Progress Notes (Signed)
Assessment and Plan:  Sascha was seen today for acute visit.  Diagnoses and all orders for this visit:  Essential hypertension - continue medications, DASH diet, exercise and monitor at home. Call if greater than 130/80.   Go to the ER if any chest pain, shortness of breath, nausea, dizziness, severe HA, changes vision/speech   Acute right-sided thoracic back pain Given Toradol 60 mg IM x 1 and Dexamethasone 10 mg IM x 1 Rest, ice/heat as needed If no improvement will refer back to ortho -     ketorolac (TORADOL) injection 60 mg -     dexamethasone (DECADRON) injection 10 mg       Further disposition pending results of labs. Discussed med's effects and SE's.   Over 30 minutes of exam, counseling, chart review, and critical decision making was performed.   Future Appointments  Date Time Provider [place]  01/25/2022 10:30 AM [name], MD [facility] None  01/27/2022  9:30 AM GI-BCG DX DEXA 1 GI-BCGDG GI-BREAST CE  03/03/2022 10:30 AM [name], MD [facility] None  03/11/2022 10:10 AM Armbruster, Carlota Raspberry, MD [facility]  07/22/2022  9:30 AM Darrol Jump, NP [facility] None  10/31/2022 10:00 AM Alycia Rossetti, NP [facility] None    ------------------------------------------------------------------------------------------------------------------   HPI BP 124/62   Pulse 88   Temp 97.7 F (36.5 C)   Ht '5\' 2"'$  (1.575 m)   Wt 150 lb (68 kg)   SpO2 94%   BMI 27.44 kg/m   86 y.o.female presents for pain on her side after a fall 2 weeks ago.  She stepped into a hole and fell onto her right side. She describes it as a sharp pain that can be 7-8/10.  She will be pain free if she is sleeping or lying flat. She does take Gabapentin but is not helping her pain. She was evaluated by [place] and had xrays which showed no fracture  She is currently on amlodipine 10 mg and Metoprolol 50 mg QD.  Denies headaches, chest pain and dizziness.   BP Readings from Last 3 Encounters:  01/12/22 124/62  12/14/21 112/72  12/07/21 (!) 140/70   BMI is Body mass index is 27.44 kg/m., she has not been working on diet and exercise. Wt Readings from Last 3 Encounters:  01/12/22 150 lb (68 kg)  12/14/21 144 lb 6.4 oz (65.5 kg)  12/07/21 145 lb 6.4 oz (66 kg)     Past Medical History:  Diagnosis Date   Anxiety    takes Xanax daily as needed   Asthma    Albuterol daily as needed   Blood transfusion without reported diagnosis    CAD (coronary artery disease)    LHC 2003 with 40% pLAD, 30% mLAD, 100% D1 (moderate vessel), 100%  D2 (small vessel), 95% mid small OM2.  Pt had PTCA to D1;  D2 and OM2 small vessels and tx medically;  Last Myoview (2012): anterior infarct seen with scar, no ischemia. EF normal. Med Rx // Myoview (12/14):  Low risk; ant scar w/ peri-infarct ischemia; EF 55% with ant AK // Myoview 5/16: EF 55-65, ant, apical scar, no ischemia    Cataract    Chronic back pain    HNP, DDD, spinal stenosis, vertebral compression fractures.    Chronic nausea    takes Zofran daily as needed.  normal gastric emptying study 03/2013   CKD (chronic kidney disease), stage III ([place]) 09/27/2017   Constipation    felt to be functional. takes  Senokot and Miralax daily.  treated with Linzess in past.    DIVERTICULOSIS, COLON 08/28/2008   Qualifier: Diagnosis of  By: [name], [name], [name]     Elevated LFTs 2015   probably med induced DILI, from Augmentin vs Pravastatin   GERD (gastroesophageal reflux disease)    hx of esophageal stricture    Hiatal hernia    Paraesophageal hernias. s/p fundoplications in 5009 and 04/2013   History of bronchitis several of yrs ago   History of colon polyps 03/2009, 03/2011   adenomatous colon polyps, no high grade dysplasia.    History of vertigo    no meds   Hyperlipidemia    takes Pravastatin daily   Hypertension    takes Amlodipine,Metoprolol,and Losartan daily   Multiple closed pelvic fractures  without disruption of pelvic circle (HCC)    Nausea 03/2016   Osteoporosis    PONV (postoperative nausea and vomiting)    yrs ago   Pubic ramus fracture ([place]) 12/20/2019   Slow urinary stream    takes Rapaflo daily   TIA (transient ischemic attack) 1995   no residual effects noted   Vertebral compression fracture (HCC) 06/23/2012     Allergies  Allergen Reactions   Ace Inhibitors Cough    Per Dr Melvyn Novas pulmonology 2013   Aspartame Diarrhea and Nausea And Vomiting   Atorvastatin Other (See Comments)    Muscle aches   Biaxin [Clarithromycin] Other (See Comments)    Unknown reaction : PATIENT CAN TOLERATE Z-PAK.  Is written on patient's paper chart.   Daliresp [Roflumilast] Nausea And Vomiting   Erythromycin Other (See Comments)    Unknown reaction: PATIENT CAN TOLERATE Z-PAK.  It is written on patient's paper chart.   Levofloxacin Nausea And Vomiting    Current Outpatient Medications on File Prior to Visit  Medication Sig   albuterol (PROVENTIL) (2.5 MG/3ML) 0.083% nebulizer solution Take 3 mLs (2.5 mg total) by nebulization every 4 (four) hours as needed for wheezing or shortness of breath.   albuterol (VENTOLIN HFA) 108 (90 Base) MCG/ACT inhaler INHALE 2 PUFFS BY MOUTH EVERY 6 HOURS AS NEEDED FOR WHEEZING FOR SHORTNESS OF BREATH   ALPRAZolam (XANAX) 0.5 MG tablet Take 1/2 tablet 1- 2 x /day ONLY if needed for Anxiety or Panic Attack &  limit to 5 days /week to avoid Addiction - DO NOT take with Hydrocodone or Codeine   amLODipine (NORVASC) 10 MG tablet Take   1 tablet  Daily for BP & Heart                                                          /                              TAKE                              BY                             MOUTH                         ONCE DAILY  amoxicillin-clavulanate (AUGMENTIN) 875-125 MG tablet Take 1 tablet by mouth 2 (two) times daily.   aspirin EC 81 MG tablet Take 81 mg by mouth at bedtime. Every other night   benzonatate (TESSALON PERLES)  100 MG capsule Take 1 capsule (100 mg total) by mouth 3 (three) times daily as needed for cough.   Budeson-Glycopyrrol-Formoterol (BREZTRI AEROSPHERE) 160-9-4.8 MCG/ACT AERO Inhale 2 puffs into the lungs in the morning and at bedtime.   Budeson-Glycopyrrol-Formoterol (BREZTRI AEROSPHERE) 160-9-4.8 MCG/ACT AERO Inhale 2 puffs into the lungs in the morning and at bedtime.   Cholecalciferol (VITAMIN D3) 5000 units CAPS Take 5,000 Units by mouth 2 (two) times daily.    DULoxetine (CYMBALTA) 30 MG capsule TAKE 1 CAPSULE BY MOUTH ONCE DAILY, FOR MOOD AND CHRONIC PAIN   gabapentin (NEURONTIN) 400 MG capsule Take 400 mg by mouth 4 (four) times daily.   ketoconazole (NIZORAL) 2 % cream Apply to  Ringworm rash  2 x /day   Magnesium 400 MG TABS Take 400 mg by mouth daily at 2 PM.   melatonin 5 MG TABS Take 5 mg by mouth at bedtime as needed.   metoprolol tartrate (LOPRESSOR) 50 MG tablet Take 1 tablet  2 x /day(every 12 hours)  for BP & Heart                                /                          TAKE                      BY                   MOUTH   mirtazapine (REMERON) 30 MG tablet Take  1/2 to 1 tablet  1 hour  before Bedtime for Appetite & Sleep   Multiple Vitamins-Minerals (MULTIVITAMIN WITH MINERALS) tablet Take 1 tablet by mouth daily.   nitroGLYCERIN (NITROSTAT) 0.4 MG SL tablet Place 1 tablet (0.4 mg total) under the tongue every 5 (five) minutes as needed for chest pain.   omeprazole (PRILOSEC) 40 MG capsule Take  1 capsule 2 x /day to Prevent Indigestion & Heartburn   predniSONE (DELTASONE) 20 MG tablet Take 2 tabs (40 mg) for 3 days followed by 1 tab (20 mg) for 4 days.   promethazine (PHENERGAN) 25 MG tablet Take  1 tablet  4 x/day  if needed for Nausea & Vomiting   promethazine-dextromethorphan (PROMETHAZINE-DM) 6.25-15 MG/5ML syrup Take 5 mLs by mouth at bedtime as needed for cough.   senna-docusate (SENOKOT-S) 8.6-50 MG tablet Take 2 tablets by mouth 2 (two) times daily.   sucralfate  (CARAFATE) 1 g tablet TAKE 1 TABLET BY MOUTH 4 TIMES DAILY BEFORE MEAL(S) AND AT BEDTIME FOR INDIGESTION AND FOR HEARTBURN   trospium (SANCTURA) 20 MG tablet Take 20 mg by mouth 2 (two) times daily.   vitamin B-12 (CYANOCOBALAMIN) 1000 MCG tablet Take 5,000 mcg by mouth daily.   No current facility-administered medications on file prior to visit.    ROS: all negative except above.   Physical Exam:  BP 124/62   Pulse 88   Temp 97.7 F (36.5 C)   Ht '5\' 2"'$  (1.575 m)   Wt 150 lb (68 kg)   SpO2 94%   BMI 27.44 kg/m   General Appearance: Well nourished, in  no apparent distress. Eyes: PERRLA, EOMs, conjunctiva no swelling or erythema Sinuses: No Frontal/maxillary tenderness ENT/Mouth: Ext aud canals clear, TMs without erythema, bulging. No erythema, swelling, or exudate on post pharynx.  Tonsils not swollen or erythematous. Hearing normal.  Neck: Supple, thyroid normal.  Respiratory: Respiratory effort normal, BS equal bilaterally without rales, rhonchi, wheezing or stridor.  Cardio: RRR with no MRGs. Brisk peripheral pulses without edema.  Abdomen: Soft, + BS.  Non tender, no guarding, rebound, hernias, masses. Lymphatics: Non tender without lymphadenopathy.  Musculoskeletal: Full ROM, 5/5 strength, antalgic gait with cane.   Increased tenderness/pain with palpation of right thoracic region.  Skin: Warm, dry without rashes, lesions, ecchymosis.  Neuro: Cranial nerves intact. Normal muscle tone, no cerebellar symptoms. Sensation intact.  Psych: Awake and oriented X 3, normal affect, Insight and Judgment appropriate.     Alycia Rossetti, NP 1:46 PM [facility]

## 2022-01-24 ENCOUNTER — Other Ambulatory Visit: Payer: Self-pay | Admitting: Internal Medicine

## 2022-01-24 DIAGNOSIS — M810 Age-related osteoporosis without current pathological fracture: Secondary | ICD-10-CM

## 2022-01-25 ENCOUNTER — Ambulatory Visit: Payer: PPO | Admitting: Internal Medicine

## 2022-01-25 NOTE — Progress Notes (Deleted)
Subjective:   Patient ID: [name] [name], female    DOB: [birth year]    MRN: [id number]     Brief patient profile:  85yowf never smoker with large hiatal hernia & recurrent asthmatic bronchitis , about twice/yr for many yrs. Lung parenchyma looks ok on Ct scans 10/2006 & 12/10. PFTs nml in 12/09 & mild airway obstruction in 08/2009.  First presented 3/09 . CT neck for thyroid imaging s/o mediastinal contour abnormal. Ct chest >> large hiatal hernia & BL upper lobe hazy , ground glass opacity.  Dec, 2010  [name])  Fe def anemia requiring transfusions, small bowel AVMs suspected.  Mar 2011 Acute visit- c/o cough and wheezing >> augmentin helped, enalapril changed to diovan       11/19/2013 acute  ov/Xaria Judon re:  [name] since July 4th 2015  Chief Complaint  Patient presents with   Acute Visit    Pt c/o cough, congestion and sinus pressure x 1 wk. Cough is prod with minimal to moderate greenish yellow sputum.     symptoms were acute "like a head cold" then into chest, progressive, with sob with exertion but not at rest Zpak July 10  [facility] 2 weeks prior to OV   got kyphoplasty, then sinus congestion Used neb am of of with good results  rec Augmentin 875 mg take one pill twice daily  X 10 days - take at breakfast and supper with large glass of water.  It would help reduce the usual side effects (diarrhea and yeast infections) if you ate cultured yogurt at lunch.  Prednisone 10 mg take  4 each am x 2 days,   2 each am x 2 days,  1 each am x 2 days and stop  Work on inhaler technique:  relax and gently blow all the way out then take a nice smooth deep breath back in, triggering the inhaler at same time you start breathing in.  Hold for up to 5 seconds if you can.  Rinse and gargle with water when done Return in 2 weeks if not completely better   > did not return      09/08/2017 1st [place] [facility] office visit/ Geza Beranek  Re AB  Chief Complaint  Patient presents with   Pulmonary Consult    Last seen  here in July 2015. She states that she is here for just "check up". Breathing is overall doing well.  She rarely uses her albuterol inhaler.   uses dulera 200 when over does it  Sleeping s resp problems  No cough  Walks at [employer] at a slow pace with a cane and not really limited by doe   rec In the event of cough/ wheeze or worse short of breath > dulera  100 or 200 up to 2 pffs every 12 hours Work on inhaler technique:   Plan B = Backup Only use your albuterol as a rescue medication Plan C = Crisis - only use your albuterol nebulizer if you first try Plan B and it fails to help > ok to use the nebulizer up to every 4 hours but if start needing it regularly call for immediate appointment    07/20/20 NP eval for acute exac rec Zpack take as directed  Mucinex DM liquid Twice daily  As needed  Cough/congestion  Prednisone taper over next week .  Albuterol inhaler As needed   Continue on BREZTRI 2 puffs Twice daily , brush/rinse/gargle after use.  Follow up with Dr. Melvyn Novas  In 6-8 weeks  with PFTs and As needed  Please contact office for sooner follow up if symptoms do not improve or worsen or seek emergency care       09/02/2020  Re-establish ov/Birttany Dechellis re: ? Asthma maint on advair 250/50 Chief Complaint  Patient presents with   Follow-up    Had PFT today-sob worse at times, cough yellow   Dyspnea:  Steps x one flight > sob/ shops anywhere at nl pace = MMRC1 = can walk nl pace, flat grade, can't hurry or go uphills or steps s sob   Cough: assoc with sob flares   Sleeping:bed is 10 degrees/ two pillows on side SABA use: no change  02: none  Covid status:   X 3 vax rec Change advair to 250/50 one twice daily  Please schedule a follow up office visit in 6 weeks, call sooner if needed  Error:  We originally recorded advair at 500 but it was 250 so the reduction should have been to the 100/50 bid/ pt informed by phone of th error on the avs but the correct rx was submitted for the 100/50  strength   10/14/2020  f/u ov/Salwa Bai re:    Asthma worse cough  actually  on 500/50 after all (very confused with names of meds and strengths) "it's whatever they gave me"  No chief complaint on file. Dyspnea: MMRC1 = can walk nl pace, flat grade, can't hurry or go uphills or steps s sob   Cough: worse with exertion esp outdoors  Sleeping: 10 degrees/ on side with 2pillows under head  SABA use: not using  02: none Covid status:   vax x 3  Rec Plan A = Automatic = Always=    dulera 100 (or Symbicort 80) Take 2 puffs first thing in am and then another 2 puffs about 12 hours later.  Work on inhaler technique:   Plan B = Backup (to supplement plan A, not to replace it) Only use your albuterol inhaler as a rescue medication      05/12/2021  f/u ov/Florine Sprenkle re: asthma / uacs   maint on symbicort 80 2bid   Chief Complaint  Patient presents with   Follow-up    Coughing since had covid 19 in Aug 2022- prod with green to yellow sputum.   Dyspnea: typically not limited by breathing from desired activities  / limited by balance uses cane  Cough: more since covid / green in am assoc subj wheeze s sinus cc Sleeping: 10 degrees on bed blocks does fine SABA use: twice daily - misunderstood instructions 02: none Covid status:   vax x 3  and infected x one  Rec Zpak Prednisone 10 mg take  4 each am x 2 days,   2 each am x 2 days,  1 each am x 2 days and stop  Remember: Only use your albuterol as a rescue medication  Ok to try albuterol 15 min before an activity (on alternating days)  that you know would usually make you short of breath      12/14/2021  f/u ov/Lavanya Roa re: asthma/ uacs  flare   maint on breztri   Chief Complaint  Patient presents with   Follow-up    Pt states she is coughing with green congestion for about x1year. Pt went to PCP and was given prednisone and antibiotic.   Dyspnea:  more of balance issue, still doing [employer] shopping ok / hc parking  Cough: worse x one month with nasal  tone voice/ worse  in am still yellow green despite doxy  Sleeping: ok on bed blocks SABA use: not much but doesn't have neb  02: none  Rec Augmentin 875 mg take one pill twice daily  X 10 days - take at breakfast and supper with large glass of water.  It would help reduce the usual side effects (diarrhea and yeast infections) if you ate cultured yogurt at lunch.  Prednisone 10 mg take  4 each am x 2 days,   2 each am x 2 days,  1 each am x 2 days and stop  For cough/ congestion > mucinex dm 1200 mg every 12 hours as needed  Work on inhaler technique:   Please schedule a follow up office visit in 6 weeks, call sooner if needed - bring your inhalers     01/25/2022  f/u ov/Kyleigha Markert re: ***   maint on ***  No chief complaint on file.   Dyspnea:  *** Cough: *** Sleeping: *** SABA use: *** 02: *** Covid status:   ***   No obvious day to day or daytime variability or assoc excess/ purulent sputum or mucus plugs or hemoptysis or cp or chest tightness, subjective wheeze or overt sinus or hb symptoms.   *** without nocturnal  or early am exacerbation  of respiratory  c/o's or need for noct saba. Also denies any obvious fluctuation of symptoms with weather or environmental changes or other aggravating or alleviating factors except as outlined above   No unusual exposure hx or h/o childhood pna/ asthma or knowledge of premature birth.  Current Allergies, Complete Past Medical History, Past Surgical History, Family History, and Social History were reviewed in [employer] record.  ROS  The following are not active complaints unless bolded Hoarseness, sore throat, dysphagia, dental problems, itching, sneezing,  nasal congestion or discharge of excess mucus or purulent secretions, ear ache,   fever, chills, sweats, unintended wt loss or wt gain, classically pleuritic or exertional cp,  orthopnea pnd or arm/hand swelling  or leg swelling, presyncope, palpitations, abdominal pain,  anorexia, nausea, vomiting, diarrhea  or change in bowel habits or change in bladder habits, change in stools or change in urine, dysuria, hematuria,  rash, arthralgias, visual complaints, headache, numbness, weakness or ataxia or problems with walking or coordination,  change in mood or  memory.        No outpatient medications have been marked as taking for the 01/25/22 encounter (Appointment) with [name], MD.                           Objective:   Physical Exam  Wts    01/25/2022         *** 12/14/2021          144   05/12/2021          143  10/14/2020          150 09/02/2020        152   09/08/2017         164   11/19/13 132 lb (59.875 kg)  11/14/13 127 lb (57.607 kg)  11/05/13 125 lb (56.7 kg)     Vital signs reviewed  01/25/2022  - Note at rest 02 sats  ***% on ***   General appearance:    ***    Min bar***                   Assessment &  Plan:

## 2022-01-27 ENCOUNTER — Other Ambulatory Visit: Payer: PPO

## 2022-01-27 ENCOUNTER — Ambulatory Visit
Admission: RE | Admit: 2022-01-27 | Discharge: 2022-01-27 | Disposition: A | Discharge status: Home / Self Care | Payer: PPO | Source: Ambulatory Visit | Attending: Internal Medicine | Admitting: Internal Medicine

## 2022-01-27 DIAGNOSIS — M810 Age-related osteoporosis without current pathological fracture: Secondary | ICD-10-CM

## 2022-01-27 DIAGNOSIS — M8589 Other specified disorders of bone density and structure, multiple sites: Secondary | ICD-10-CM | POA: Diagnosis not present

## 2022-01-27 DIAGNOSIS — Z780 Asymptomatic menopausal state: Secondary | ICD-10-CM | POA: Diagnosis not present

## 2022-01-27 NOTE — Progress Notes (Signed)
<><><><><><><><><><><><><><><><><><><><><><><><><><><><><><><><><> <><><><><><><><><><><><><><><><><><><><><><><><><><><><><><><><><>  -   Bone Density shows Osteopenia, but almost Osteoporosis   - Recommend Exercise, Calcium supplements 500-600 mg /day  and  Continue your  Vitamin D  10,000 units  /day   -  Recommend repeat Bone Density test in 2 years   <><><><><><><><><><><><><><><><><><><><><><><><><><><><><><><><><> <><><><><><><><><><><><><><><><><><><><><><><><><><><><><><><><><>

## 2022-01-28 ENCOUNTER — Encounter: Payer: Self-pay | Admitting: Internal Medicine

## 2022-01-28 ENCOUNTER — Ambulatory Visit: Payer: PPO | Admitting: Internal Medicine

## 2022-01-28 DIAGNOSIS — R0609 Other forms of dyspnea: Secondary | ICD-10-CM | POA: Diagnosis not present

## 2022-01-28 DIAGNOSIS — J4520 Mild intermittent asthma, uncomplicated: Secondary | ICD-10-CM | POA: Diagnosis not present

## 2022-01-28 MED ORDER — ALBUTEROL SULFATE HFA 108 (90 BASE) MCG/ACT IN AERS: INHALATION_SPRAY | RESPIRATORY_TRACT | 0 refills | Status: DC

## 2022-01-28 MED ORDER — ALBUTEROL SULFATE (2.5 MG/3ML) 0.083% IN NEBU: 2.5000 mg | INHALATION_SOLUTION | RESPIRATORY_TRACT | 2 refills | Status: DC | PRN

## 2022-01-28 NOTE — Progress Notes (Unsigned)
Subjective:   Patient ID: [name] [name], female    DOB: 1935-12-29    MRN: [id number]     Brief patient profile:  56  yowf never smoker with large hiatal hernia & recurrent asthmatic bronchitis , about twice/yr for many yrs. Lung parenchyma looks ok on Ct scans 10/2006 & 12/10. PFTs nml in 12/09 & mild airway obstruction in 08/2009.  First presented 3/09 . CT neck for thyroid imaging s/o mediastinal contour abnormal. Ct chest >> large hiatal hernia & BL upper lobe hazy , ground glass opacity.  Dec, 2010  [name])  Fe def anemia requiring transfusions, small bowel AVMs suspected.  Mar 2011 Acute visit- c/o cough and wheezing >> augmentin helped, enalapril changed to diovan     09/08/2017 1st  office visit/ Kilyn Maragh  Re AB  Chief Complaint  Patient presents with   Pulmonary Consult    Last seen here in July 2015. She states that she is here for just "check up". Breathing is overall doing well.  She rarely uses her albuterol inhaler.   uses dulera 200 when over does it  Sleeping s resp problems  No cough  Walks at [employer] at a slow pace with a cane and not really limited by doe   rec In the event of cough/ wheeze or worse short of breath > dulera  100 or 200 up to 2 pffs every 12 hours Work on inhaler technique:   Plan B = Backup Only use your albuterol as a rescue medication Plan C = Crisis - only use your albuterol nebulizer if you first try Plan B and it fails to help > ok to use the nebulizer up to every 4 hours but if start needing it regularly call for immediate appointment    07/20/20 NP eval for acute exac rec Zpack take as directed  Mucinex DM liquid Twice daily  As needed  Cough/congestion  Prednisone taper over next week .  Albuterol inhaler As needed   Continue on BREZTRI 2 puffs Twice daily , brush/rinse/gargle after use.  Follow up with Dr. Melvyn Novas  In 6-8 weeks with PFTs and As needed  Please contact office for sooner follow up if symptoms do not improve or worsen or seek  emergency care       12/14/2021  f/u ov/[name] [name] re: asthma/ uacs  flare   maint on breztri   Chief Complaint  Patient presents with   Follow-up    Pt states she is coughing with green congestion for about x1year. Pt went to PCP and was given prednisone and antibiotic.   Dyspnea:  more of balance issue, still doing [employer] shopping ok / hc parking  Cough: worse x one month with nasal tone voice/ worse in am still yellow green despite doxy  Sleeping: ok on bed blocks SABA use: not much but doesn't have neb  02: none  Rec Augmentin 875 mg take one pill twice daily  X 10 days  .  Prednisone 10 mg take  4 each am x 2 days,   2 each am x 2 days,  1 each am x 2 days and stop  For cough/ congestion > mucinex dm 1200 mg every 12 hours as needed  Work on inhaler technique:   Please schedule a follow up office visit in 6 weeks, call sooner if needed - bring your inhalers     01/28/2022  f/u ov/[name] [name] re: asthma/doe  maint on breztri 2 each am and sometimes at night   Chief  Complaint  Patient presents with   Follow-up    She feels she has improved some since last OV.   Dyspnea:  housework limited by sob / does fine at [employer] /[employer] pushing cart can do  whole store Cough: none  Sleeping: bedblocks  > no resp cc   SABA use: rarely need/ hardly ever but sometimes takes because husband hears here wheezing  02: none  Covid status:   vax x 3    No obvious day to day or daytime variability or assoc excess/ purulent sputum or mucus plugs or hemoptysis or cp or chest tightness,  or overt sinus or hb symptoms.   Sleeping  without nocturnal  or early am exacerbation  of respiratory  c/o's or need for noct saba. Also denies any obvious fluctuation of symptoms with weather or environmental changes or other aggravating or alleviating factors except as outlined above   No unusual exposure hx or h/o childhood pna/ asthma or knowledge of premature birth.  Current Allergies, Complete Past Medical  History, Past Surgical History, Family History, and Social History were reviewed in [employer] record.  ROS  The following are not active complaints unless bolded Hoarseness, sore throat, dysphagia, dental problems, itching, sneezing,  nasal congestion or discharge of excess mucus or purulent secretions, ear ache,   fever, chills, sweats, unintended wt loss or wt gain, classically pleuritic or exertional cp,  orthopnea pnd or arm/hand swelling  or leg swelling, presyncope, palpitations, abdominal pain, anorexia, nausea, vomiting, diarrhea  or change in bowel habits or change in bladder habits, change in stools or change in urine, dysuria, hematuria,  rash, arthralgias, visual complaints, headache, numbness, weakness or ataxia/walks with cane  or problems with walking or coordination,  change in mood or  memory.        Current Meds  Medication Sig   albuterol (PROVENTIL) (2.5 MG/3ML) 0.083% nebulizer solution Take 3 mLs (2.5 mg total) by nebulization every 4 (four) hours as needed for wheezing or shortness of breath.   albuterol (VENTOLIN HFA) 108 (90 Base) MCG/ACT inhaler INHALE 2 PUFFS BY MOUTH EVERY 6 HOURS AS NEEDED FOR WHEEZING FOR SHORTNESS OF BREATH   ALPRAZolam (XANAX) 0.5 MG tablet Take 1/2 tablet 1- 2 x /day ONLY if needed for Anxiety or Panic Attack &  limit to 5 days /week to avoid Addiction - DO NOT take with Hydrocodone or Codeine   amLODipine (NORVASC) 10 MG tablet Take   1 tablet  Daily for BP & Heart                                                          /                              TAKE                              BY                             MOUTH  ONCE DAILY   aspirin EC 81 MG tablet Take 81 mg by mouth at bedtime. Every other night   Budeson-Glycopyrrol-Formoterol (BREZTRI AEROSPHERE) 160-9-4.8 MCG/ACT AERO Inhale 2 puffs into the lungs in the morning and at bedtime.   Budeson-Glycopyrrol-Formoterol (BREZTRI AEROSPHERE) 160-9-4.8  MCG/ACT AERO Inhale 2 puffs into the lungs in the morning and at bedtime.   Cholecalciferol (VITAMIN D3) 5000 units CAPS Take 5,000 Units by mouth 2 (two) times daily.    DULoxetine (CYMBALTA) 30 MG capsule TAKE 1 CAPSULE BY MOUTH ONCE DAILY, FOR MOOD AND CHRONIC PAIN   gabapentin (NEURONTIN) 400 MG capsule Take 400 mg by mouth 4 (four) times daily.   ketoconazole (NIZORAL) 2 % cream Apply to  Ringworm rash  2 x /day   Magnesium 400 MG TABS Take 400 mg by mouth daily at 2 PM.   melatonin 5 MG TABS Take 5 mg by mouth at bedtime as needed.   metoprolol tartrate (LOPRESSOR) 50 MG tablet Take 1 tablet  2 x /day(every 12 hours)  for BP & Heart                                /                          TAKE                      BY                   MOUTH   Multiple Vitamins-Minerals (MULTIVITAMIN WITH MINERALS) tablet Take 1 tablet by mouth daily.   nitroGLYCERIN (NITROSTAT) 0.4 MG SL tablet Place 1 tablet (0.4 mg total) under the tongue every 5 (five) minutes as needed for chest pain.   omeprazole (PRILOSEC) 40 MG capsule Take  1 capsule 2 x /day to Prevent Indigestion & Heartburn   promethazine (PHENERGAN) 25 MG tablet Take  1 tablet  4 x/day  if needed for Nausea & Vomiting   senna-docusate (SENOKOT-S) 8.6-50 MG tablet Take 2 tablets by mouth 2 (two) times daily.   sucralfate (CARAFATE) 1 g tablet TAKE 1 TABLET BY MOUTH 4 TIMES DAILY BEFORE MEAL(S) AND AT BEDTIME FOR INDIGESTION AND FOR HEARTBURN   trospium (SANCTURA) 20 MG tablet Take 20 mg by mouth 2 (two) times daily.   vitamin B-12 (CYANOCOBALAMIN) 1000 MCG tablet Take 5,000 mcg by mouth daily.               Objective:   Physical Exam  Wts    01/28/2022        148 12/14/2021          144   05/12/2021          143  10/14/2020          150 09/02/2020        152   09/08/2017         164   11/19/13 132 lb (59.875 kg)  11/14/13 127 lb (57.607 kg)  11/05/13 125 lb (56.7 kg)     Vital signs reviewed  01/28/2022  - Note at rest 02 sats  96% on RA    General appearance:    elderly wf /classic pseudowheeze better with plm    HEENT : Oropharynx  clear      NECK :  without  apparent JVD/ palpable Nodes/TM  LUNGS: no acc muscle use,  Min barrel  contour chest wall with bilateral  slightly decreased bs s audible wheeze and  without cough on insp or exp maneuvers and min  Hyperresonant  to  percussion bilaterally    CV:  RRR  no s3 or murmur or increase in P2, and no edema   ABD:  soft and nontender with pos end  insp Hoover's  in the supine position.  No bruits or organomegaly appreciated   MS:  Nl gait/ ext warm without deformities Or obvious joint restrictions  calf tenderness, cyanosis or clubbing     SKIN: warm and dry without lesions    NEURO:  alert, approp, nl sensorium with  no motor or cerebellar deficits apparent.           Assessment & Plan:

## 2022-01-28 NOTE — Patient Instructions (Signed)
No change in your medications ok just to use Breztri 2 puffs each am and pm doses optional  Only use your albuterol as a rescue medication to be used if you can't catch your breath by resting or doing a relaxed purse lip breathing pattern.  - The less you use it, the better it will work when you need it. - Ok to use up to 2 puffs  every 4 hours if you must but call for immediate appointment if use goes up over your usual need - Don't leave home without it !!  (think of it like the spare tire for your car)    Please schedule a follow up visit in 6  months but call sooner if needed

## 2022-01-29 ENCOUNTER — Encounter: Payer: Self-pay | Admitting: Internal Medicine

## 2022-01-29 NOTE — Assessment & Plan Note (Signed)
09/08/2017  After extensive coaching inhaler device  effectiveness =    75% > continue dulera 100 2 bid prn  - Spirometry 09/08/2017  wnl with min curvature off rx  -  PFT's  09/02/2020  FEV1 1.38 (73 % ) ratio 0.77  p 11 % improvement from saba p advair 250  prior to study with DLCO  19.41 (103%) corrects to 5.38 (131%)  for alv volume and FV curve minimally concave  - 10/14/2020  After extensive coaching inhaler device,  effectiveness =    75% so try symbicort 80 2 bid   - 01/28/2022  After extensive coaching inhaler device,  effectiveness =    80%   Says breztri has been more effective than any priors to improve dyspnea and cough so reasonable to continue but would try taper to just 2 puffs in am to see if she is really dep on on dose ICS and laba/lama component (doubt)         Each maintenance medication was reviewed in detail including emphasizing most importantly the difference between maintenance and prns and under what circumstances the prns are to be triggered using an action plan format where appropriate.  Total time for H and P, chart review, counseling, reviewing hfa device(s) and generating customized AVS unique to this office visit / same day charting = 22 min

## 2022-02-01 ENCOUNTER — Encounter: Payer: Self-pay | Admitting: Specialist

## 2022-02-10 ENCOUNTER — Encounter: Payer: Self-pay | Admitting: Nurse Practitioner

## 2022-02-10 ENCOUNTER — Ambulatory Visit (INDEPENDENT_AMBULATORY_CARE_PROVIDER_SITE_OTHER): Payer: PPO | Admitting: Nurse Practitioner

## 2022-02-10 VITALS — BP 136/60 | HR 76 | Temp 97.3°F | Ht 62.0 in | Wt 149.0 lb

## 2022-02-10 DIAGNOSIS — L03011 Cellulitis of right finger: Secondary | ICD-10-CM | POA: Diagnosis not present

## 2022-02-10 DIAGNOSIS — I10 Essential (primary) hypertension: Secondary | ICD-10-CM

## 2022-02-10 MED ORDER — CEPHALEXIN 500 MG PO CAPS: 500.0000 mg | ORAL_CAPSULE | Freq: Three times a day (TID) | ORAL | 0 refills | Status: AC

## 2022-02-10 NOTE — Progress Notes (Signed)
Assessment and Plan:  [name] [name] was seen today for hand pain.  Diagnoses and all orders for this visit:  Essential hypertension - continue medications, DASH diet, exercise and monitor at home. Call if greater than 130/80.   Cellulitis of right middle finger Continue warm soaks with Epsom salts.  If no improvement in next 3-5 days or worsens notify the office -     cephALEXin (KEFLEX) 500 MG capsule; Take 1 capsule (500 mg total) by mouth 3 (three) times daily for 7 days.        Further disposition pending results of labs. Discussed med's effects and SE's.   Over 30 minutes of exam, counseling, chart review, and critical decision making was performed.   Future Appointments  Date Time Provider [place]  03/03/2022 10:30 AM [name], MD [facility] None  03/11/2022 10:10 AM Armbruster, Carlota Raspberry, MD [facility]  07/22/2022  9:30 AM Darrol Jump, NP [facility] None  10/31/2022 10:00 AM Alycia Rossetti, NP [facility] None    ------------------------------------------------------------------------------------------------------------------   HPI BP 136/60   Pulse 76   Temp (!) 97.3 F (36.3 C)   Ht '5\' 2"'$  (1.575 m)   Wt 149 lb (67.6 kg)   SpO2 96%   BMI 27.25 kg/m    86 y.o.female presents for tender area on her right middle finger that has been present for approximately 2 weeks.  Denies any known injury to the finger. Has been soaking finger in Epsom salts  She is currently on Metoprolol 50 mg and Amlodipine 10 mg QD AND BP's are well controlled.  Denies headaches, chest pain, shortness of breath and dizziness.  BP Readings from Last 3 Encounters:  02/10/22 136/60  01/28/22 116/70  01/12/22 124/62    Past Medical History:  Diagnosis Date   Anxiety    takes Xanax daily as needed   Asthma    Albuterol daily as needed   Blood transfusion without reported diagnosis    CAD (coronary artery disease)    LHC 2003 with 40% pLAD, 30% mLAD, 100% D1  (moderate vessel), 100%  D2 (small vessel), 95% mid small OM2.  Pt had PTCA to D1;  D2 and OM2 small vessels and tx medically;  Last Myoview (2012): anterior infarct seen with scar, no ischemia. EF normal. Med Rx // Myoview (12/14):  Low risk; ant scar w/ peri-infarct ischemia; EF 55% with ant AK // Myoview 5/16: EF 55-65, ant, apical scar, no ischemia    Cataract    Chronic back pain    HNP, DDD, spinal stenosis, vertebral compression fractures.    Chronic nausea    takes Zofran daily as needed.  normal gastric emptying study 03/2013   CKD (chronic kidney disease), stage III ([place]) 09/27/2017   Constipation    felt to be functional. takes Senokot and Miralax daily.  treated with Linzess in past.    DIVERTICULOSIS, COLON 08/28/2008   Qualifier: Diagnosis of  By: [name], [name], [name]     Elevated LFTs 2015   probably med induced DILI, from Augmentin vs Pravastatin   GERD (gastroesophageal reflux disease)    hx of esophageal stricture    Hiatal hernia    Paraesophageal hernias. s/p fundoplications in 9892 and 04/2013   History of bronchitis several of yrs ago   History of colon polyps 03/2009, 03/2011   adenomatous colon polyps, no high grade dysplasia.    History of vertigo    no meds   Hyperlipidemia    takes Pravastatin daily  Hypertension    takes Amlodipine,Metoprolol,and Losartan daily   Multiple closed pelvic fractures without disruption of pelvic circle (HCC)    Nausea 03/2016   Osteoporosis    PONV (postoperative nausea and vomiting)    yrs ago   Pubic ramus fracture ([place]) 12/20/2019   Slow urinary stream    takes Rapaflo daily   TIA (transient ischemic attack) 1995   no residual effects noted   Vertebral compression fracture (HCC) 06/23/2012     Allergies  Allergen Reactions   Ace Inhibitors Cough    Per Dr Melvyn Novas pulmonology 2013   Aspartame Diarrhea and Nausea And Vomiting   Atorvastatin Other (See Comments)    Muscle aches   Biaxin [Clarithromycin] Other (See  Comments)    Unknown reaction : PATIENT CAN TOLERATE Z-PAK.  Is written on patient's paper chart.   Daliresp [Roflumilast] Nausea And Vomiting   Erythromycin Other (See Comments)    Unknown reaction: PATIENT CAN TOLERATE Z-PAK.  It is written on patient's paper chart.   Levofloxacin Nausea And Vomiting    Current Outpatient Medications on File Prior to Visit  Medication Sig   albuterol (PROVENTIL) (2.5 MG/3ML) 0.083% nebulizer solution Take 3 mLs (2.5 mg total) by nebulization every 4 (four) hours as needed for wheezing or shortness of breath.   albuterol (VENTOLIN HFA) 108 (90 Base) MCG/ACT inhaler INHALE 2 PUFFS BY MOUTH EVERY 6 HOURS AS NEEDED FOR WHEEZING FOR SHORTNESS OF BREATH   ALPRAZolam (XANAX) 0.5 MG tablet Take 1/2 tablet 1- 2 x /day ONLY if needed for Anxiety or Panic Attack &  limit to 5 days /week to avoid Addiction - DO NOT take with Hydrocodone or Codeine   amLODipine (NORVASC) 10 MG tablet Take   1 tablet  Daily for BP & Heart                                                          /                              TAKE                              BY                             MOUTH                         ONCE DAILY   aspirin EC 81 MG tablet Take 81 mg by mouth at bedtime. Every other night   Budeson-Glycopyrrol-Formoterol (BREZTRI AEROSPHERE) 160-9-4.8 MCG/ACT AERO Inhale 2 puffs into the lungs in the morning and at bedtime.   Budeson-Glycopyrrol-Formoterol (BREZTRI AEROSPHERE) 160-9-4.8 MCG/ACT AERO Inhale 2 puffs into the lungs in the morning and at bedtime.   Cholecalciferol (VITAMIN D3) 5000 units CAPS Take 5,000 Units by mouth 2 (two) times daily.    DULoxetine (CYMBALTA) 30 MG capsule TAKE 1 CAPSULE BY MOUTH ONCE DAILY, FOR MOOD AND CHRONIC PAIN   gabapentin (NEURONTIN) 400 MG capsule Take 400 mg by mouth 4 (four) times daily.   ketoconazole (NIZORAL) 2 % cream Apply to  Ringworm rash  2 x /day   Magnesium 400 MG TABS Take 400 mg by mouth daily at 2 PM.   melatonin 5 MG  TABS Take 5 mg by mouth at bedtime as needed.   metoprolol tartrate (LOPRESSOR) 50 MG tablet Take 1 tablet  2 x /day(every 12 hours)  for BP & Heart                                /                          TAKE                      BY                   MOUTH   Multiple Vitamins-Minerals (MULTIVITAMIN WITH MINERALS) tablet Take 1 tablet by mouth daily.   nitroGLYCERIN (NITROSTAT) 0.4 MG SL tablet Place 1 tablet (0.4 mg total) under the tongue every 5 (five) minutes as needed for chest pain.   omeprazole (PRILOSEC) 40 MG capsule Take  1 capsule 2 x /day to Prevent Indigestion & Heartburn   promethazine (PHENERGAN) 25 MG tablet Take  1 tablet  4 x/day  if needed for Nausea & Vomiting   senna-docusate (SENOKOT-S) 8.6-50 MG tablet Take 2 tablets by mouth 2 (two) times daily.   sucralfate (CARAFATE) 1 g tablet TAKE 1 TABLET BY MOUTH 4 TIMES DAILY BEFORE MEAL(S) AND AT BEDTIME FOR INDIGESTION AND FOR HEARTBURN   trospium (SANCTURA) 20 MG tablet Take 20 mg by mouth 2 (two) times daily.   vitamin B-12 (CYANOCOBALAMIN) 1000 MCG tablet Take 5,000 mcg by mouth daily.   No current facility-administered medications on file prior to visit.    ROS: all negative except above.   Physical Exam:  BP 136/60   Pulse 76   Temp (!) 97.3 F (36.3 C)   Ht '5\' 2"'$  (1.575 m)   Wt 149 lb (67.6 kg)   SpO2 96%   BMI 27.25 kg/m   General Appearance: Well nourished, in no apparent distress. Eyes: PERRLA, EOMs, conjunctiva no swelling or erythema Sinuses: No Frontal/maxillary tenderness ENT/Mouth: Ext aud canals clear, TMs without erythema, bulging. No erythema, swelling, or exudate on post pharynx.  Tonsils not swollen or erythematous. Hearing normal.  Neck: Supple, thyroid normal.  Respiratory: Respiratory effort normal, BS equal bilaterally without rales, rhonchi, wheezing or stridor.  Cardio: RRR with no MRGs. Brisk peripheral pulses without edema.  Abdomen: Soft, + BS.  Non tender, no guarding, rebound,  hernias, masses. Lymphatics: Non tender without lymphadenopathy.  Musculoskeletal: Full ROM, 5/5 strength, normal gait.  Skin: Warm, dry. She has a 2 cm blanched area on right 3rd finger that has a small opening draining serous drainage.  Mildly tender to touch.  Neuro: Cranial nerves intact. Normal muscle tone, no cerebellar symptoms. Sensation intact.  Psych: Awake and oriented X 3, normal affect, Insight and Judgment appropriate.     Alycia Rossetti, NP 3:42 PM [facility]

## 2022-02-25 ENCOUNTER — Encounter: Payer: Self-pay | Admitting: Internal Medicine

## 2022-02-25 ENCOUNTER — Ambulatory Visit: Payer: PPO | Admitting: Internal Medicine

## 2022-02-25 DIAGNOSIS — J4520 Mild intermittent asthma, uncomplicated: Secondary | ICD-10-CM

## 2022-02-25 LAB — NITRIC OXIDE: Nitric Oxide: 195

## 2022-02-25 MED ORDER — BUDESONIDE-FORMOTEROL FUMARATE 160-4.5 MCG/ACT IN AERO: INHALATION_SPRAY | RESPIRATORY_TRACT | 12 refills | Status: DC

## 2022-02-25 MED ORDER — AMOXICILLIN-POT CLAVULANATE 875-125 MG PO TABS: 1.0000 | ORAL_TABLET | Freq: Two times a day (BID) | ORAL | 0 refills | Status: DC

## 2022-02-25 MED ORDER — PREDNISONE 10 MG PO TABS: ORAL_TABLET | ORAL | 0 refills | Status: DC

## 2022-02-25 NOTE — Progress Notes (Signed)
Subjective:   Patient ID: [name] [name], female    DOB: [birth year]    MRN: [id number]     Brief patient profile:  26  yowf never smoker with large hiatal hernia & recurrent asthmatic bronchitis , about twice/yr for many yrs. Lung parenchyma looks ok on Ct scans 10/2006 & 12/10. PFTs nml in 12/09 & mild airway obstruction in 08/2009.  First presented 3/09 . CT neck for thyroid imaging s/o mediastinal contour abnormal. Ct chest >> large hiatal hernia & BL upper lobe hazy , ground glass opacity.  Dec, 2010  [name] [name])  Fe def anemia requiring transfusions, small bowel AVMs suspected.  Mar 2011 Acute visit- c/o cough and wheezing >> augmentin helped, enalapril changed to diovan     09/08/2017 1st  office visit/ [name] [name]  Re AB  Chief Complaint  Patient presents with   Pulmonary Consult    Last seen here in July 2015. She states that she is here for just "check up". Breathing is overall doing well.  She rarely uses her albuterol inhaler.   uses dulera 200 when over does it  Sleeping s resp problems  No cough  Walks at [employer] at a slow pace with a cane and not really limited by doe   rec In the event of cough/ wheeze or worse short of breath > dulera  100 or 200 up to 2 pffs every 12 hours Work on inhaler technique:   Plan B = Backup Only use your albuterol as a rescue medication Plan C = Crisis - only use your albuterol nebulizer if you first try Plan B and it fails to help > ok to use the nebulizer up to every 4 hours but if start needing it regularly call for immediate appointment    07/20/20 NP eval for acute exac rec Zpack take as directed  Mucinex DM liquid Twice daily  As needed  Cough/congestion  Prednisone taper over next week .  Albuterol inhaler As needed   Continue on BREZTRI 2 puffs Twice daily , brush/rinse/gargle after use.  Follow up with Dr. Melvyn Novas  In 6-8 weeks with PFTs and As needed  Please contact office for sooner follow up if symptoms do not improve or worsen or seek  emergency care       12/14/2021  f/u ov/[name] [name] re: asthma/ uacs  flare   maint on breztri   Chief Complaint  Patient presents with   Follow-up    Pt states she is coughing with green congestion for about x1year. Pt went to PCP and was given prednisone and antibiotic.   Dyspnea:  more of balance issue, still doing [employer] shopping ok / [employer] parking  Cough: worse x one month with nasal tone voice/ worse in am still yellow green despite doxy  Sleeping: ok on bed blocks SABA use: not much but doesn't have neb  02: none  Rec Augmentin 875 mg take one pill twice daily  X 10 days  .  Prednisone 10 mg take  4 each am x 2 days,   2 each am x 2 days,  1 each am x 2 days and stop  For cough/ congestion > mucinex dm 1200 mg every 12 hours as needed  Work on inhaler technique:   Please schedule a follow up office visit in 6 weeks, call sooner if needed - bring your inhalers     01/28/2022  f/u ov/[name] [name] re: asthma/doe  maint on breztri 2 each am and sometimes at night   Chief  Complaint  Patient presents with   Follow-up    She feels she has improved some since last OV.   Dyspnea:  housework limited by sob / does fine at [employer] /[employer] pushing cart can do  whole store Cough: none  Sleeping: bedblocks  > no resp cc   SABA use: rarely need/ hardly ever but sometimes takes because husband hears here wheezing  02: none  Covid status:   vax x 3  Rec No change in your medications ok just to use Breztri 2 puffs each am and pm doses optional Only use your albuterol as a rescue medication  Please schedule a follow up visit in 6  months but call sooner if needed     02/25/2022  Acute ov/[name] [name] re: asthma/ doe maint on symbicort 80 2 bid/ max gerd rx poor insight into meds Chief Complaint  Patient presents with   Acute Visit    Increased cough since the last visit- prod with thick, yellow and green sputum. She also c/o wheezing and SOB.    Dyspnea:  still can do [employer] pushing buggy  Cough:  worse x one week and turning dark again  Sleeping: on bed blocks does fine  SABA use: rarely at baseline  02: none    No obvious day to day or daytime variability or assoc e mucus plugs or hemoptysis or cp or chest tightness,   or overt sinus or hb symptoms.   Sleeping surprisingly well  without nocturnal  or early am exacerbation  of respiratory  c/o's or need for noct saba. Also denies any obvious fluctuation of symptoms with weather or environmental changes or other aggravating or alleviating factors except as outlined above   No unusual exposure hx or h/o childhood pna/ asthma or knowledge of premature birth.  Current Allergies, Complete Past Medical History, Past Surgical History, Family History, and Social History were reviewed in [facility] record.  ROS  The following are not active complaints unless bolded Hoarseness, sore throat, dysphagia, dental problems, itching, sneezing,  nasal congestion or discharge of excess mucus or purulent secretions, ear ache,   fever, chills, sweats, unintended wt loss or wt gain, classically pleuritic or exertional cp,  orthopnea pnd or arm/hand swelling  or leg swelling, presyncope, palpitations, abdominal pain, anorexia, nausea, vomiting, diarrhea  or change in bowel habits or change in bladder habits, change in stools or change in urine, dysuria, hematuria,  rash, arthralgias, visual complaints, headache, numbness, weakness or ataxia or problems with walking or coordination,  change in mood or  memory.        Current Meds  Medication Sig   albuterol (PROVENTIL) (2.5 MG/3ML) 0.083% nebulizer solution Take 3 mLs (2.5 mg total) by nebulization every 4 (four) hours as needed for wheezing or shortness of breath.   albuterol (VENTOLIN HFA) 108 (90 Base) MCG/ACT inhaler INHALE 2 PUFFS BY MOUTH EVERY 6 HOURS AS NEEDED FOR WHEEZING FOR SHORTNESS OF BREATH   ALPRAZolam (XANAX) 0.5 MG tablet Take 1/2 tablet 1- 2 x /day ONLY if needed for  Anxiety or Panic Attack &  limit to 5 days /week to avoid Addiction - DO NOT take with Hydrocodone or Codeine   amLODipine (NORVASC) 10 MG tablet Take   1 tablet  Daily for BP & Heart                                                          /  TAKE                              BY                             MOUTH                         ONCE DAILY   aspirin EC 81 MG tablet Take 81 mg by mouth at bedtime. Every other night   Budeson-Glycopyrrol-Formoterol (BREZTRI AEROSPHERE) 160-9-4.8 MCG/ACT AERO Inhale 2 puffs into the lungs in the morning and at bedtime.   Cholecalciferol (VITAMIN D3) 5000 units CAPS Take 5,000 Units by mouth 2 (two) times daily.    DULoxetine (CYMBALTA) 30 MG capsule TAKE 1 CAPSULE BY MOUTH ONCE DAILY, FOR MOOD AND CHRONIC PAIN   gabapentin (NEURONTIN) 400 MG capsule Take 400 mg by mouth 4 (four) times daily.   ketoconazole (NIZORAL) 2 % cream Apply to  Ringworm rash  2 x /day   Magnesium 400 MG TABS Take 400 mg by mouth daily at 2 PM.   melatonin 5 MG TABS Take 5 mg by mouth at bedtime as needed.   metoprolol tartrate (LOPRESSOR) 50 MG tablet Take 1 tablet  2 x /day(every 12 hours)  for BP & Heart                                /                          TAKE                      BY                   MOUTH   Multiple Vitamins-Minerals (MULTIVITAMIN WITH MINERALS) tablet Take 1 tablet by mouth daily.   nitroGLYCERIN (NITROSTAT) 0.4 MG SL tablet Place 1 tablet (0.4 mg total) under the tongue every 5 (five) minutes as needed for chest pain.   omeprazole (PRILOSEC) 40 MG capsule Take  1 capsule 2 x /day to Prevent Indigestion & Heartburn   promethazine (PHENERGAN) 25 MG tablet Take  1 tablet  4 x/day  if needed for Nausea & Vomiting   senna-docusate (SENOKOT-S) 8.6-50 MG tablet Take 2 tablets by mouth 2 (two) times daily.   sucralfate (CARAFATE) 1 g tablet TAKE 1 TABLET BY MOUTH 4 TIMES DAILY BEFORE MEAL(S) AND AT BEDTIME FOR INDIGESTION AND FOR HEARTBURN    trospium (SANCTURA) 20 MG tablet Take 20 mg by mouth 2 (two) times daily.   vitamin B-12 (CYANOCOBALAMIN) 1000 MCG tablet Take 5,000 mcg by mouth daily.              Objective:   Physical Exam  Wts    02/25/2022      147  01/28/2022        148 12/14/2021          144   05/12/2021          143  10/14/2020          150 09/02/2020        152   09/08/2017         164   11/19/13 132 lb (59.875 kg)  11/14/13 127 lb (57.607 kg)  11/05/13 125 lb (56.7 kg)     Vital signs reviewed  02/25/2022  - Note at rest 02 sats  96% on RA   General appearance:    amb elderly wf/ nasal tone to voice    HEENT : Oropharynx  clear   Nasal turbinates mild non-specific edema    NECK :  without  apparent JVD/ palpable Nodes/TM    LUNGS: no acc muscle use,  Min barrel  contour chest wall with bilateral insp/exp rhonchi  and  without cough on insp or exp maneuvers and min  Hyperresonant  to  percussion bilaterally    CV:  RRR  no s3 or murmur or increase in P2, and no edema   ABD:  soft and nontender with pos end  insp Hoover's  in the supine position.  No bruits or organomegaly appreciated   MS:  Nl gait/ ext warm without deformities Or obvious joint restrictions  calf tenderness, cyanosis or clubbing     SKIN: warm and dry without lesions    NEURO:  alert, approp, nl sensorium with  no motor or cerebellar deficits apparent.              Assessment & Plan:

## 2022-02-25 NOTE — Patient Instructions (Signed)
Plan A = Automatic = Always=    Symbicort 160  Take 2 puffs first thing in am and then another 2 puffs about 12 hours later.     Plan B = Backup (to supplement plan A, not to replace it) Only use your albuterol inhaler as a rescue medication to be used if you can't catch your breath by resting or doing a relaxed purse lip breathing pattern.  - The less you use it, the better it will work when you need it. - Ok to use the inhaler up to 2 puffs  every 4 hours if you must but call for appointment if use goes up over your usual need - Don't leave home without it !!  (think of it like the spare tire for your car)   Plan C = Crisis (instead of Plan B but only if Plan B stops working) - only use your albuterol nebulizer if you first try Plan B and it fails to help > ok to use the nebulizer up to every 4 hours but if start needing it regularly call for immediate appointment   Prednisone 10 mg take  4 each am x 2 days,   2 each am x 2 days,  1 each am x 2 days and stop   Augmentin 875 mg take one pill twice daily  X 10 days - take at breakfast and supper with large glass of water.  It would help reduce the usual side effects (diarrhea and yeast infections) if you ate cultured yogurt at lunch.

## 2022-02-25 NOTE — Assessment & Plan Note (Signed)
09/08/2017  After extensive coaching inhaler device  effectiveness =    75% > continue dulera 100 2 bid prn  - Spirometry 09/08/2017  wnl with min curvature off rx  -  PFT's  09/02/2020  FEV1 1.38 (73 % ) ratio 0.77  p 11 % improvement from saba p advair 250  prior to study with DLCO  19.41 (103%) corrects to 5.38 (131%)  for alv volume and FV curve minimally concave  - 10/14/2020  try symbicort 80 2 bid    >>>  02/25/2022  After extensive coaching inhaler device,  effectiveness =    75% increase symbicort to 160 2bid for freq flare on symb 80 though ? Compliance?   May need to change to bud/performist if pattern of flaring continues   In the meantime rx: Max gerd rx symb 160 2bid Prednisone 10 mg take  4 each am x 2 days,   2 each am x 2 days,  1 each am x 2 days and stop  Augmentin 875 mg take one pill twice daily  X 10 days  Keep prior appt         Each maintenance medication was reviewed in detail including emphasizing most importantly the difference between maintenance and prns and under what circumstances the prns are to be triggered using an action plan format where appropriate.  Total time for H and P, chart review, counseling, reviewing hfa device(s) and generating customized AVS unique to this acute office visit / same day charting = 30 min

## 2022-02-25 NOTE — Addendum Note (Signed)
Addended by: Rosana Berger on: 02/25/2022 02:10 PM   Modules accepted: Orders

## 2022-03-03 ENCOUNTER — Ambulatory Visit: Payer: PPO | Admitting: Internal Medicine

## 2022-03-07 ENCOUNTER — Other Ambulatory Visit: Payer: Self-pay | Admitting: Internal Medicine

## 2022-03-07 DIAGNOSIS — J4520 Mild intermittent asthma, uncomplicated: Secondary | ICD-10-CM

## 2022-03-09 ENCOUNTER — Ambulatory Visit: Payer: PPO | Admitting: Gastroenterology

## 2022-03-10 ENCOUNTER — Ambulatory Visit: Payer: PPO | Admitting: Internal Medicine

## 2022-03-10 ENCOUNTER — Encounter: Payer: Self-pay | Admitting: Internal Medicine

## 2022-03-10 ENCOUNTER — Ambulatory Visit (INDEPENDENT_AMBULATORY_CARE_PROVIDER_SITE_OTHER): Payer: PPO | Admitting: Internal Medicine

## 2022-03-10 VITALS — BP 130/70 | HR 76 | Temp 98.0°F | Resp 16 | Ht 62.0 in | Wt 148.8 lb

## 2022-03-10 DIAGNOSIS — I10 Essential (primary) hypertension: Secondary | ICD-10-CM | POA: Diagnosis not present

## 2022-03-10 DIAGNOSIS — K219 Gastro-esophageal reflux disease without esophagitis: Secondary | ICD-10-CM | POA: Diagnosis not present

## 2022-03-10 DIAGNOSIS — E782 Mixed hyperlipidemia: Secondary | ICD-10-CM

## 2022-03-10 DIAGNOSIS — R7309 Other abnormal glucose: Secondary | ICD-10-CM | POA: Diagnosis not present

## 2022-03-10 DIAGNOSIS — Z23 Encounter for immunization: Secondary | ICD-10-CM

## 2022-03-10 DIAGNOSIS — Z79899 Other long term (current) drug therapy: Secondary | ICD-10-CM | POA: Diagnosis not present

## 2022-03-10 DIAGNOSIS — I700 Atherosclerosis of aorta: Secondary | ICD-10-CM

## 2022-03-10 DIAGNOSIS — E559 Vitamin D deficiency, unspecified: Secondary | ICD-10-CM | POA: Diagnosis not present

## 2022-03-10 NOTE — Patient Instructions (Signed)

## 2022-03-10 NOTE — Progress Notes (Signed)
Future Appointments  Date Time Provider Department  03/10/2022                       ov  3:30 PM [name], MD [facility]  03/11/2022 10:10 AM Armbruster, Carlota Raspberry, MD [facility]  03/22/2022 10:45 AM [name], MD [facility]  07/22/2022                      wellness  9:30 AM Darrol Jump, NP [facility]  10/31/2022                      cpe 10:00 AM Alycia Rossetti, NP [facility]    History of Present Illness:      This very nice  MWF  presents for 3  month follow up with HTN, CKD3a, HLD, GERD, Pre-Diabetes and Vitamin D Deficiency.  Lumbar CT scan on 01/29/2021 showed Aortic & Coronary Art Atherosclerosis.         Patient is treated for HTN & BP has been controlled at home. Today's BP is at goal - 130/70. Patient has had no complaints of any cardiac type chest pain, palpitations, dyspnea / orthopnea / PND, dizziness, claudication, or dependent edema.        Hyperlipidemia is controlled with diet & meds. Patient denies myalgias or other med SE's. Last Lipids were at goal :                                      Lab Results  Component Value Date   CHOL 157 10/28/2021   HDL 63 10/28/2021   LDLCALC 77 10/28/2021   TRIG 86 10/28/2021   CHOLHDL 2.5 10/28/2021     Also, the patient has history of PreDiabetes and has had no symptoms of reactive hypoglycemia, diabetic polys, paresthesias or visual blurring.  Last A1c was near goal :  Lab Results  Component Value Date   HGBA1C 5.9 (H) 10/28/2021     Further, the patient also has history of Vitamin D Deficiency and supplements vitamin D without any suspected side-effects. Last vitamin D was at goal :  Lab Results  Component Value Date   VD25OH 92 10/28/2021       Current Outpatient Medications  Medication Instructions   albuterol 2.5 mg Nebulization  Every 4 hours PRN   albuterol  HFA inhaler  2 PUFFS  EVERY 6 HRS AS NEEDED    ALPRAZolam  0.5 MG tablet Take 1/2 tablet 1- 2 x /day ONLY if needed     amLODipine 10 MG tablet Take   1 tablet  Daily    aspirin EC  81 mg Every other night   SYMBICORT 160-4.5  inhaler Take 2 puffs  am and t 2 puffs 12 hours later.    VITAMIN B12   5,000 mcg    Daily   DULoxetine  30 MG capsule TAKE 1 CAPSULE DAILY   NEURONTIN  400 mg,  times daily   ketoconazole  2 % cream Apply to  Ringworm rash  2 x /day   Magnesium 400 mg, Oral, Daily   melatonin 5 mg, Oral, At bedtime PRN   metoprolol tartrate 50 MG tablet Take 1 tablet  2 x /day(every 12 hours)   Multiple Vitamins-Minerals  1 tablet, Oral, Daily   NITROSTAT   0.4 mg Sublingual  Every 5  min PRN   omeprazole 40 MG capsule Take  1 capsule 2 x /day   promethazine 25 MG tablet Take  1 tablet  4 x/day  if needed    SENOKOT-S  8 2 tablets   2 times daily   sucralfate 1 g tablet TAKE 1 TABLET 4 TIMES DAILY   trospium (SANCTURA) 20 mg, Oral, 2 times daily   Vitamin D3 5,000 Units, Oral, 2 times daily     Allergies  Allergen Reactions   Ace Inhibitors Cough    Per Dr Melvyn Novas pulmonology 2013   Aspartame Diarrhea and Nausea And Vomiting   Atorvastatin Other (See Comments)    Muscle aches   Biaxin [Clarithromycin] Other (See Comments)    Unknown reaction : PATIENT CAN TOLERATE Z-PAK.  Is written on patient's paper chart.   Daliresp [Roflumilast] Nausea And Vomiting   Erythromycin Other (See Comments)    Unknown reaction: PATIENT CAN TOLERATE Z-PAK.  It is written on patient's paper chart.   Levofloxacin Nausea And Vomiting     PMHx:   Past Medical History:  Diagnosis Date   Anxiety    takes Xanax daily as needed   Asthma    Albuterol daily as needed   Blood transfusion without reported diagnosis    CAD (coronary artery disease)    LHC 2003 with 40% pLAD, 30% mLAD, 100% D1 (moderate vessel), 100%  D2 (small vessel), 95% mid small OM2.  Pt had PTCA to D1;  D2 and OM2 small vessels and tx medically;  Last Myoview (2012): anterior infarct seen with scar, no ischemia. EF normal. Med Rx // Myoview  (12/14):  Low risk; ant scar w/ peri-infarct ischemia; EF 55% with ant AK // Myoview 5/16: EF 55-65, ant, apical scar, no ischemia    Cataract    Chronic back pain    HNP, DDD, spinal stenosis, vertebral compression fractures.    Chronic nausea    takes Zofran daily as needed.  normal gastric emptying study 03/2013   CKD (chronic kidney disease), stage III ([place]) 09/27/2017   Constipation    felt to be functional. takes Senokot and Miralax daily.  treated with Linzess in past.    DIVERTICULOSIS, COLON 08/28/2008   Qualifier: Diagnosis of  By: [name], [name], [name]     Elevated LFTs 2015   probably med induced DILI, from Augmentin vs Pravastatin   GERD (gastroesophageal reflux disease)    hx of esophageal stricture    Hiatal hernia    Paraesophageal hernias. s/p fundoplications in 1740 and 04/2013   History of bronchitis several of yrs ago   History of colon polyps 03/2009, 03/2011   adenomatous colon polyps, no high grade dysplasia.    History of vertigo    no meds   Hyperlipidemia    takes Pravastatin daily   Hypertension    takes Amlodipine,Metoprolol,and Losartan daily   Multiple closed pelvic fractures without disruption of pelvic circle (HCC)    Nausea 03/2016   Osteoporosis    PONV (postoperative nausea and vomiting)    yrs ago   Pubic ramus fracture ([place]) 12/20/2019   Slow urinary stream    takes Rapaflo daily   TIA (transient ischemic attack) 1995   no residual effects noted   Vertebral compression fracture ([place]) 06/23/2012     Immunization History  Administered Date(s) Administered   DT  05/06/2014   Influenza Split 02/07/2011   Influenza Whole 02/09/2009, 01/19/2010, 02/13/2012   Influenza, High Dose  02/14/2018, 02/14/2019, 03/16/2020  PFIZER  SARS-COV-2 Vacc 05/25/2019, 06/15/2019, 03/24/2020   Pneumococcal -13 05/06/2014   Pneumococcal -23 02/09/2011   Pneumococcal -23 02/06/2010, 02/09/2012   Td 05/10/2003     Past Surgical History:  Procedure  Laterality Date   APPENDECTOMY     patient unsure of date   [place]     Patient unsure of date.   CORONARY ANGIOPLASTY  11/16/2001   1 stent   ESOPHAGEAL MANOMETRY N/A 03/25/2013   Procedure: ESOPHAGEAL MANOMETRY (EM);  Surgeon: Inda Castle, MD;  Location: [facility] ENDOSCOPY;  Service: Endoscopy;  Laterality: N/A;   ESOPHAGOGASTRODUODENOSCOPY N/A 03/15/2013   Procedure: ESOPHAGOGASTRODUODENOSCOPY (EGD);  Surgeon: Jerene Bears, MD;  Location: [facility] ENDOSCOPY;  Service: Endoscopy;  Laterality: N/A;   ESOPHAGOGASTRODUODENOSCOPY N/A 12/25/2015   Procedure: ESOPHAGOGASTRODUODENOSCOPY (EGD);  Surgeon: Doran Stabler, MD;  Location: [facility] ENDOSCOPY;  Service: Endoscopy;  Laterality: N/A;   EYE SURGERY  11/2000   bilateral cataracts with lens implant   FIXATION KYPHOPLASTY LUMBAR SPINE  X 2   "L3-4"   HEMORRHOID SURGERY     HIATAL HERNIA REPAIR  06/03/2011   Procedure: LAPAROSCOPIC REPAIR OF HIATAL HERNIA;  Surgeon: Adin Hector, MD;  Location: [facility] ORS;  Service: General;  Laterality: N/A;   HIATAL HERNIA REPAIR N/A 04/24/2013   Procedure: LAPAROSCOPIC  REPAIR RECURRENT PARASOPHAGEAL HIATAL HERNIA WITH FUNDOPLICATION;  Surgeon: Adin Hector, MD;  Location: [facility];  Service: General;  Laterality: N/A;   INSERTION OF MESH N/A 04/24/2013   Procedure: INSERTION OF MESH;  Surgeon: Adin Hector, MD;  Location: [facility];  Service: General;  Laterality: N/A;   IR KYPHO THORACIC WITH BONE BIOPSY  09/29/2017   IR KYPHO THORACIC WITH BONE BIOPSY  11/03/2017   IR KYPHO THORACIC WITH BONE BIOPSY  02/14/2019   IR KYPHO THORACIC WITH BONE BIOPSY  04/02/2019   LAPAROSCOPIC LYSIS OF ADHESIONS N/A 04/24/2013   Procedure: LAPAROSCOPIC LYSIS OF ADHESIONS;  Surgeon: Adin Hector, MD;  Location: [facility];  Service: General;  Laterality: N/A;   LAPAROSCOPIC NISSEN FUNDOPLICATION  1/94/1740   Procedure: LAPAROSCOPIC NISSEN FUNDOPLICATION;  Surgeon: Adin Hector, MD;   Location: [facility];  Service: General;  Laterality: N/A;   LUMBAR FUSION  12/07/2015   Right L2-3 facetectomy, posterolateral fusion L2-3 with pedicle screws, rods, local bone graft, Vivigen cancellous chips. Fusion extended to the L1 level with pedicle screws and rods   LUMBAR LAMINECTOMY/DECOMPRESSION MICRODISCECTOMY Right 06/26/2012   Procedure: LUMBAR LAMINECTOMY/DECOMPRESSION MICRODISCECTOMY;  Surgeon: Jessy Oto, MD;  Location: [place];  Service: Orthopedics;  Laterality: Right;  RIGHT L4-5 MICRODISCECTOMY   LUMBAR LAMINECTOMY/DECOMPRESSION MICRODISCECTOMY N/A 05/29/2015   Procedure: RIGHT L2-3 MICRODISCECTOMY;  Surgeon: Jessy Oto, MD;  Location: [place];  Service: Orthopedics;  Laterality: N/A;   TOTAL ABDOMINAL HYSTERECTOMY  1975   partial   UPPER GASTROINTESTINAL ENDOSCOPY      FHx:    Reviewed / unchanged  SHx:    Reviewed / unchanged   Systems Review:  Constitutional: Denies fever, chills, wt changes, headaches, insomnia, fatigue, night sweats, change in appetite. Eyes: Denies redness, blurred vision, diplopia, discharge, itchy, watery eyes.  ENT: Denies discharge, congestion, post nasal drip, epistaxis, sore throat, earache, hearing loss, dental pain, tinnitus, vertigo, sinus pain, snoring.  CV: Denies chest pain, palpitations, irregular heartbeat, syncope, dyspnea, diaphoresis, orthopnea, PND, claudication or edema. Respiratory: denies cough, dyspnea, DOE, pleurisy, hoarseness, laryngitis, wheezing.  Gastrointestinal:  Denies dysphagia, odynophagia, heartburn, reflux, water brash, abdominal pain or cramps, nausea, vomiting, bloating, diarrhea, constipation, hematemesis, melena, hematochezia  or hemorrhoids. Genitourinary: Denies dysuria, frequency, urgency, nocturia, hesitancy, discharge, hematuria or flank pain. Musculoskeletal: Denies arthralgias, myalgias, stiffness, jt. swelling, pain, limping or strain/sprain.  Skin: Denies pruritus, rash, hives, warts, acne, eczema or  change in skin lesion(s). Neuro: No weakness, tremor, incoordination, spasms, paresthesia or pain. Psychiatric: Denies confusion, memory loss or sensory loss. Endo: Denies change in weight, skin or hair change.  Heme/Lymph: No excessive bleeding, bruising or enlarged lymph nodes.  Physical Exam  BP 130/70   Pulse 76   Temp 98 F (36.7 C)   Resp 16   Ht '5\' 2"'$  (1.575 m)   Wt 148 lb 12.8 oz (67.5 kg)   SpO2 96%   BMI 27.22 kg/m   Appears  well nourished, well groomed  and in no distress.  Eyes: PERRLA, EOMs, conjunctiva no swelling or erythema. Sinuses: No frontal/maxillary tenderness ENT/Mouth: EAC's clear, TM's nl w/o erythema, bulging. Nares clear w/o erythema, swelling, exudates. Oropharynx clear without erythema or exudates. Oral hygiene is good. Tongue normal, non obstructing. Hearing intact.  Neck: Supple. Thyroid not palpable. Car 2+/2+ without bruits, nodes or JVD. Chest: Kyphotic. Respirations nl with BS clear & equal w/o rales, rhonchi, wheezing or stridor.  Cor: Heart sounds normal w/ regular rate and rhythm without sig. murmurs, gallops, clicks or rubs. Peripheral pulses normal and equal  without edema.  Abdomen: Soft & bowel sounds normal. Non-tender w/o guarding, rebound, hernias, masses or organomegaly.  Lymphatics: Unremarkable.  Musculoskeletal: Full ROM all peripheral extremities, joint stability, 5/5 strength and normal gait.  Skin: Warm, dry without exposed rashes, lesions or ecchymosis apparent.  Neuro: Cranial nerves intact, reflexes equal bilaterally. Sensory-motor testing grossly intact. Tendon reflexes grossly intact.  Pysch: Alert & oriented x 3.  Insight and judgement nl & appropriate. No ideations.  Assessment and Plan:  1. Essential hypertension  - CBC with Differential/Platelet - COMPLETE METABOLIC PANEL WITH GFR - Magnesium - TSH  2. Hyperlipidemia, mixed  - Lipid panel - TSH  3. Abnormal glucose  - Hemoglobin A1c - Insulin, random  4.  Vitamin D deficiency  - VITAMIN D 25 Hydroxy   5. Atherosclerosis of aorta ([place]) by Lumbar CYT scan 01/29/2021  - Lipid panel  6. Gastroesophageal reflux disease  - CBC with Differential/Platelet  7. Medication management  - CBC with Differential/Platelet - COMPLETE METABOLIC PANEL WITH GFR - Magnesium - Lipid panel - TSH - Hemoglobin A1c - Insulin, random - VITAMIN D 25 Hydroxy          Discussed  regular exercise, BP monitoring, weight control to achieve/maintain BMI less than 25 and discussed med and SE's. Recommended labs to assess and monitor clinical status with further disposition pending results of labs.  I discussed the assessment and treatment plan with the patient. The patient was provided an opportunity to ask questions and all were answered. The patient agreed with the plan and demonstrated an understanding of the instructions.  I provided over 30 minutes of exam, counseling, chart review and  complex critical decision making.        The patient was advised to call back or seek an in-person evaluation if the symptoms worsen or if the condition fails to improve as anticipated.   Kirtland Bouchard, MD

## 2022-03-11 ENCOUNTER — Ambulatory Visit: Payer: PPO | Admitting: Gastroenterology

## 2022-03-11 ENCOUNTER — Ambulatory Visit: Payer: PPO | Admitting: Internal Medicine

## 2022-03-11 ENCOUNTER — Encounter: Payer: Self-pay | Admitting: Gastroenterology

## 2022-03-11 VITALS — BP 110/66 | HR 63 | Ht 63.0 in | Wt 148.2 lb

## 2022-03-11 DIAGNOSIS — R1310 Dysphagia, unspecified: Secondary | ICD-10-CM

## 2022-03-11 DIAGNOSIS — K219 Gastro-esophageal reflux disease without esophagitis: Secondary | ICD-10-CM | POA: Diagnosis not present

## 2022-03-11 DIAGNOSIS — Z79899 Other long term (current) drug therapy: Secondary | ICD-10-CM

## 2022-03-11 LAB — COMPLETE METABOLIC PANEL WITH GFR
AG Ratio: 1.6 (calc) (ref 1.0–2.5)
ALT: 17 U/L (ref 6–29)
AST: 19 U/L (ref 10–35)
Albumin: 3.9 g/dL (ref 3.6–5.1)
Alkaline phosphatase (APISO): 73 U/L (ref 37–153)
BUN/Creatinine Ratio: 15 (calc) (ref 6–22)
BUN: 15 mg/dL (ref 7–25)
CO2: 22 mmol/L (ref 20–32)
Calcium: 9.4 mg/dL (ref 8.6–10.4)
Chloride: 107 mmol/L (ref 98–110)
Creat: 1.02 mg/dL — ABNORMAL HIGH (ref 0.60–0.95)
Globulin: 2.4 g/dL (calc) (ref 1.9–3.7)
Glucose, Bld: 173 mg/dL — ABNORMAL HIGH (ref 65–99)
Potassium: 4.3 mmol/L (ref 3.5–5.3)
Sodium: 140 mmol/L (ref 135–146)
Total Bilirubin: 0.4 mg/dL (ref 0.2–1.2)
Total Protein: 6.3 g/dL (ref 6.1–8.1)
eGFR: 54 mL/min/{1.73_m2} — ABNORMAL LOW (ref >=60)

## 2022-03-11 LAB — CBC WITH DIFFERENTIAL/PLATELET
Absolute Monocytes: 443 cells/uL (ref 200–950)
Basophils Absolute: 83 cells/uL (ref 0–200)
Basophils Relative: 1.1 %
Eosinophils Absolute: 758 cells/uL — ABNORMAL HIGH (ref 15–500)
Eosinophils Relative: 10.1 %
HCT: 40 % (ref 35.0–45.0)
Hemoglobin: 14 g/dL (ref 11.7–15.5)
Lymphs Abs: 2603 cells/uL (ref 850–3900)
MCH: 32.3 pg (ref 27.0–33.0)
MCHC: 35 g/dL (ref 32.0–36.0)
MCV: 92.2 fL (ref 80.0–100.0)
MPV: 10.4 fL (ref 7.5–12.5)
Monocytes Relative: 5.9 %
Neutro Abs: 3615 cells/uL (ref 1500–7800)
Neutrophils Relative %: 48.2 %
Platelets: 362 10*3/uL (ref 140–400)
RBC: 4.34 10*6/uL (ref 3.80–5.10)
RDW: 12.7 % (ref 11.0–15.0)
Total Lymphocyte: 34.7 %
WBC: 7.5 10*3/uL (ref 3.8–10.8)

## 2022-03-11 LAB — HEMOGLOBIN A1C
Hgb A1c MFr Bld: 6.2 % of total Hgb — ABNORMAL HIGH (ref <5.7)
Mean Plasma Glucose: 131 mg/dL
eAG (mmol/L): 7.3 mmol/L

## 2022-03-11 LAB — VITAMIN D 25 HYDROXY (VIT D DEFICIENCY, FRACTURES): Vit D, 25-Hydroxy: 103 ng/mL — ABNORMAL HIGH (ref 30–100)

## 2022-03-11 LAB — LIPID PANEL
Cholesterol: 137 mg/dL (ref <200)
HDL: 52 mg/dL (ref >=50)
LDL Cholesterol (Calc): 55 mg/dL (calc)
Non-HDL Cholesterol (Calc): 85 mg/dL (calc) (ref <130)
Total CHOL/HDL Ratio: 2.6 (calc) (ref <5.0)
Triglycerides: 256 mg/dL — ABNORMAL HIGH (ref <150)

## 2022-03-11 LAB — TSH: TSH: 0.55 mIU/L (ref 0.40–4.50)

## 2022-03-11 LAB — MAGNESIUM: Magnesium: 1.9 mg/dL (ref 1.5–2.5)

## 2022-03-11 LAB — INSULIN, RANDOM: Insulin: 134.8 u[IU]/mL — ABNORMAL HIGH

## 2022-03-11 MED ORDER — OMEPRAZOLE 40 MG PO CPDR: 40.0000 mg | DELAYED_RELEASE_CAPSULE | Freq: Every day | ORAL | 0 refills | Status: DC

## 2022-03-11 MED ORDER — SUCRALFATE 1 G PO TABS: 1.0000 g | ORAL_TABLET | ORAL | 0 refills | Status: DC | PRN

## 2022-03-11 NOTE — Patient Instructions (Addendum)
_______________________________________________________  If you are age 84 or older, your body mass index should be between 23-30. Your Body mass index is 26.26 kg/m. If this is out of the aforementioned range listed, please consider follow up with your Primary Care Provider.  If you are age 65 or younger, your body mass index should be between 19-25. Your Body mass index is 26.26 kg/m. If this is out of the aformentioned range listed, please consider follow up with your Primary Care Provider.   ________________________________________________________  The [place] [facility] providers would like to encourage you to use [facility] to communicate with providers for non-urgent requests or questions.  Due to long hold times on the telephone, sending your provider a message by [facility] may be a faster and more efficient way to get a response.  Please allow 48 business hours for a response.  Please remember that this is for non-urgent requests.  _______________________________________________________   Stop routine use of Carafate and only use as needed  Decrease omeprazole to ONCE daily.  Follow up in 1 year.  Thank you for entrusting me with your care and for choosing [facility], Dr. [place]

## 2022-03-11 NOTE — Progress Notes (Signed)
<><><><><><><><><><><><><><><><><><><><><><><><><><><><><><><><><> <><><><><><><><><><><><><><><><><><><><><><><><><><><><><><><><><>  Glucose has "jumped" up to 173 mg %   !   & A1c is up to 6.2%    -   getting very close to "full blown" Diabetes ( A1c = 6.5% )    - Being diabetic has a  300% increased risk for heart attack,                                                  stroke, cancer, and alzheimer- type vascular dementia.   It is very important that you work harder with diet by   avoiding all foods that are white except chicken,fish & calliflower.   - Avoid white rice  (brown & wild rice is OK),   - Avoid white potatoes  (sweet potatoes in moderation is OK),   White bread or wheat bread or anything made out of   white flour like bagels, donuts, rolls, buns, biscuits, cakes,   - pastries, cookies, pizza crust, and pasta (made from  white flour & egg whites)   - vegetarian pasta or spinach or wheat pasta is OK.   - Multigrain breads like Arnold's, Pepperidge Farm or                                             multigrain sandwich thins or high fiber breads like   [place] bread or "Dave's Killer" breads that are  4 to 5 grams fiber per slice !  are best.    Diet, exercise and weight loss can reverse and cure diabetes in the early stages.    - Diet, exercise and weight loss is very important in the   control and prevention of complications of diabetes which   affects every system in your body, ie.   -Brain - dementia/stroke,  - eyes - glaucoma/blindness,  - heart - heart attack/heart failure,  - kidneys - dialysis,  - stomach - gastric paralysis,  - intestines - malabsorption,  - nerves - severe painful neuritis,  - circulation - gangrene & loss of a leg(s)  - and finally  . . . . . . . . . . . . . . . . . .   - cancer and Alzheimers.    <><><><><><><><><><><><><><><><><><><><><><><><><><><><><><><><><> <><><><><><><><><><><><><><><><><><><><><><><><><><><><><><><><><>  -  Total  Chol =    137  - Excellent             (  Ideal  or  Goal is less than 180  !  )  & -  Bad / Dangerous LDL  Chol =   55  - also Excellent              (  Ideal  or  Goal is less than 70  !  )   But  . . . . . . . . . . . .   Triglycerides = 256  or fats in blood are too high                 (   Ideal or  Goal is less than 150  !  )    - Recommend avoid fried & greasy foods,  sweets / candy,   - Avoid white rice  (  brown or wild rice or Quinoa is OK),   - Avoid white potatoes  (sweet potatoes are OK)   - Avoid anything made from white flour  - bagels, doughnuts, rolls, buns, biscuits, white and   wheat breads, pizza crust and traditional  pasta made of white flour & egg white   - (vegetarian pasta or spinach or wheat pasta is OK).    - Multi-grain bread is OK - like multi-grain flat bread or   sandwich thins.   - Avoid alcohol in excess.   - Exercise is also important.  <><><><><><><><><><><><><><><><><><><><><><><><><><><><><><><><><> <><><><><><><><><><><><><><><><><><><><><><><><><><><><><><><><><>  -  Vitamin D = 103  -   Excellent  - Please keep dose same  <><><><><><><><><><><><><><><><><><><><><><><><><><><><><><><><><>  -   All Else - CBC - Kidneys - Electrolytes - Liver - Magnesium & Thyroid    - all  Normal / OK <><><><><><><><><><><><><><><><><><><><><><><><><><><><><><><><><> <><><><><><><><><><><><><><><><><><><><><><><><><><><><><><><><><>

## 2022-03-11 NOTE — Progress Notes (Signed)
HPI :  86 year old female here for follow-up.  Recall she has a history of paraesophageal hernias repaired in the past with fundoplication, chronic reflux, dysphagia.  History of TIA.  Recall she had a history of esophageal candidiasis in the past.  Her last endoscopy was in March 2021, she had an angulated turn in her distal esophagus entering the hernia sac.  Does not appear to be focal stenosis.  She has had this noted in the past on prior barium swallow and remote EGD for which she was dilated in the past.  Sounds like she did not get any significant long-term benefit from the dilation.  She has questions about the source of her dysphagia and long-term plan.  She states in general her dysphagia is very mild and intermittent.  She states if she eats bulkier food such as chicken or cornbread, if she eats them too quickly she can feel it lodged temporarily in her lower chest and it either passes or she regurgitates it up.  If she is not eating specific food triggers like this or very bulky food she will not have dysphagia.  The symptoms have been mild and intermittent for a very long time at this point.  She has been on longstanding PPI for some time to control reflux symptoms.  At her last visit in August 2022 I had discussed with her risks of chronic PPI and recommended she decrease her dosing to once daily.  It sounds like she has not done that.  She continues to take it twice daily.  She also states that most days she has been taking Carafate 4 times daily for reflux, however she inquires why she needs to do this.  She states she is not having any heartburn or reflux that bothers her.  We discussed about long-term plan.  She recently had a DEXA scan that showed osteopenia, she has had a fall history and fractured her tailbone in the past.  She denies any odynophagia or pains in her chest otherwise.     Most recent workup:  EGD 07/11/19 - The Z-line was regular. - Patchy, white plaques were found  in the middle third of the esophagus and in the lower third of the esophagus consistent with esophageal candidiasis. - The lower third of the esophagus was tortuous with an angulated turn into the Nissen. No obvious stenosis noted at the GEJ (previously had this in 2019 and dilated) - The exam of the esophagus was otherwise normal. - Evidence of a Nissen fundoplication was found in the cardia. The wrap appeared intact. A retained suture was present. I attempted to remove it with forceps but it was buried into the mucosa and not removed. - The exam of the stomach was otherwise normal. - Biopsies were taken with a cold forceps in the gastric body, at the incisura and in the gastric antrum for Helicobacter pylori testing. - The duodenal bulb and second portion of the duodenum were normal.   Surgical [P], gastric antrum and gastric body - REACTIVE GASTROPATHY - NO H. PYLORI OR INTESTINAL METAPLASIA IDENTIFIED - SEE COMMENT    Colonoscopy 05/17/2017 - The perianal and digital rectal examinations were normal. - A 4 mm polyp was found in the cecum. The polyp was sessile. The polyp was removed with a cold snare. Resection and retrieval were complete. - Two sessile polyps were found in the ascending colon. The polyps were 4 to 5 mm in size. These polyps were removed with a cold snare. Resection  and retrieval were complete. - Two sessile polyps were found in the transverse colon. The polyps were 4 to 5 mm in size. These polyps were removed with a cold snare. Resection and retrieval were complete. - A 6 mm polyp was found in the descending colon. The polyp was sessile. The polyp was removed with a cold snare. Resection and retrieval were complete. - Scattered medium-mouthed diverticula were found in the entire colon. - Internal hemorrhoids were found during retroflexion. - The colon was tortous. The exam was otherwise without abnormality.   6 adenomas   EGD 02/20/2018 - Esophagogastric landmarks  were identified: the Z-line was found at 36 cm. - The lower esophagus was tortuous with an angulated turn into the fundoplication. No obvious stenosis was appreciated. - A TTS dilator was passed through the scope. Dilation with a 16-17-18 mm balloon dilator was performed to 16 mm, 17 mm and 18 mm at the gastroesophageal junction without any appreciable mucosal wrent. - Multiple diminutive white plaques were found in the lower third of the esophagus. Brushings for KOH prep were obtained. - The exam of the esophagus was otherwise normal. - Evidence of a Nissen fundoplication was found in the cardia, with a retained suture visible. The wrap was intact. - Patchy inflammation characterized by erythema was found in the gastric antrum. Biopsies were taken with a cold forceps for histology. - The exam of the stomach was otherwise normal. - The duodenal bulb and second portion of the duodenum were normal.   EGD 01/24/2018 - Z-line, 35 cm from the incisors. - Suspect mild esophagitis dissecans - Tortous esophagus with angulated entrance to fundoplication versus mild stenosis - dilation not performed given volume of food in the stomach - A large amount of food (residue) in the stomach, the procedure was aborted.     EGD 12/25/2015 -  Normal exam, fundoplication noted - per Dr. Loletha Carrow Esophageal manometry 08/15/2015 - intact peristalsis, normal LES relaxation, intermittent nonspecific spasm EGD 03/15/2013 - esophageal candidiasis, recurrent 4-5 cm hernia, history of Nissen with [name] lesion Colonoscopy 04/07/2011 - 2 small adenomatous polyps, internal hemorrhoids,    CT angio abdomen / pelvis - 04/05/2017 - normal CT abdomen / pelvis 03/10/2017 - mild stable CBD dilation 74m post cholecystectomy CT abdomen pelvis 03/27/2016 - normal abdomen,  Barium swallow 12/24/15 - stricture in distal 3rd of esophagus, nonspecific dysmotility Gastric emptying study 03/2013 - normal     DEXA scan 01/27/22 - T  score (-) 2.4 - osreopenia       Past Medical History:  Diagnosis Date   Anxiety    takes Xanax daily as needed   Asthma    Albuterol daily as needed   Blood transfusion without reported diagnosis    CAD (coronary artery disease)    LHC 2003 with 40% pLAD, 30% mLAD, 100% D1 (moderate vessel), 100%  D2 (small vessel), 95% mid small OM2.  Pt had PTCA to D1;  D2 and OM2 small vessels and tx medically;  Last Myoview (2012): anterior infarct seen with scar, no ischemia. EF normal. Med Rx // Myoview (12/14):  Low risk; ant scar w/ peri-infarct ischemia; EF 55% with ant AK // Myoview 5/16: EF 55-65, ant, apical scar, no ischemia    Cataract    Chronic back pain    HNP, DDD, spinal stenosis, vertebral compression fractures.    Chronic nausea    takes Zofran daily as needed.  normal gastric emptying study 03/2013   CKD (chronic kidney disease), stage III (  [place]) 09/27/2017   Constipation    felt to be functional. takes Senokot and Miralax daily.  treated with Linzess in past.    DIVERTICULOSIS, COLON 08/28/2008   Qualifier: Diagnosis of  By: [name], [name], [name]     Elevated LFTs 2015   probably med induced DILI, from Augmentin vs Pravastatin   GERD (gastroesophageal reflux disease)    hx of esophageal stricture    Hiatal hernia    Paraesophageal hernias. s/p fundoplications in 0160 and 04/2013   History of bronchitis several of yrs ago   History of colon polyps 03/2009, 03/2011   adenomatous colon polyps, no high grade dysplasia.    History of vertigo    no meds   Hyperlipidemia    takes Pravastatin daily   Hypertension    takes Amlodipine,Metoprolol,and Losartan daily   Multiple closed pelvic fractures without disruption of pelvic circle (HCC)    Nausea 03/2016   Osteoporosis    PONV (postoperative nausea and vomiting)    yrs ago   Pubic ramus fracture (HCC) 12/20/2019   Slow urinary stream    takes Rapaflo daily   TIA (transient ischemic attack) 1995   no residual effects noted    Vertebral compression fracture ([place]) 06/23/2012     Past Surgical History:  Procedure Laterality Date   APPENDECTOMY     patient unsure of date   [place]     Patient unsure of date.   CORONARY ANGIOPLASTY  11/16/2001   1 stent   ESOPHAGEAL MANOMETRY N/A 03/25/2013   Procedure: ESOPHAGEAL MANOMETRY (EM);  Surgeon: Inda Castle, MD;  Location: [facility] ENDOSCOPY;  Service: Endoscopy;  Laterality: N/A;   ESOPHAGOGASTRODUODENOSCOPY N/A 03/15/2013   Procedure: ESOPHAGOGASTRODUODENOSCOPY (EGD);  Surgeon: Jerene Bears, MD;  Location: [facility] ENDOSCOPY;  Service: Endoscopy;  Laterality: N/A;   ESOPHAGOGASTRODUODENOSCOPY N/A 12/25/2015   Procedure: ESOPHAGOGASTRODUODENOSCOPY (EGD);  Surgeon: Doran Stabler, MD;  Location: [facility] ENDOSCOPY;  Service: Endoscopy;  Laterality: N/A;   EYE SURGERY  11/2000   bilateral cataracts with lens implant   FIXATION KYPHOPLASTY LUMBAR SPINE  X 2   "L3-4"   HEMORRHOID SURGERY     HIATAL HERNIA REPAIR  06/03/2011   Procedure: LAPAROSCOPIC REPAIR OF HIATAL HERNIA;  Surgeon: Adin Hector, MD;  Location: [facility] ORS;  Service: General;  Laterality: N/A;   HIATAL HERNIA REPAIR N/A 04/24/2013   Procedure: LAPAROSCOPIC  REPAIR RECURRENT PARASOPHAGEAL HIATAL HERNIA WITH FUNDOPLICATION;  Surgeon: Adin Hector, MD;  Location: [facility] ORS;  Service: General;  Laterality: N/A;   INSERTION OF MESH N/A 04/24/2013   Procedure: INSERTION OF MESH;  Surgeon: Adin Hector, MD;  Location: [facility] OR[facility];  Service: General;  Laterality: N/A;   IR KYPHO THORACIC WITH BONE BIOPSY  09/29/2017   IR KYPHO THORACIC WITH BONE BIOPSY  11/03/2017   IR KYPHO THORACIC WITH BONE BIOPSY  02/14/2019   IR KYPHO THORACIC WITH BONE BIOPSY  04/02/2019   LAPAROSCOPIC LYSIS OF ADHESIONS N/A 04/24/2013   Procedure: LAPAROSCOPIC LYSIS OF ADHESIONS;  Surgeon: Adin Hector, MD;  Location: [facility];  Service: General;  Laterality: N/A;   LAPAROSCOPIC NISSEN FUNDOPLICATION   05/17/3233   Procedure: LAPAROSCOPIC NISSEN FUNDOPLICATION;  Surgeon: Adin Hector, MD;  Location: [facility];  Service: General;  Laterality: N/A;   LUMBAR FUSION  12/07/2015   Right L2-3 facetectomy, posterolateral fusion L2-3 with pedicle screws, rods, local bone graft, Vivigen cancellous chips.  Fusion extended to the L1 level with pedicle screws and rods   LUMBAR LAMINECTOMY/DECOMPRESSION MICRODISCECTOMY Right 06/26/2012   Procedure: LUMBAR LAMINECTOMY/DECOMPRESSION MICRODISCECTOMY;  Surgeon: Jessy Oto, MD;  Location: [place];  Service: Orthopedics;  Laterality: Right;  RIGHT L4-5 MICRODISCECTOMY   LUMBAR LAMINECTOMY/DECOMPRESSION MICRODISCECTOMY N/A 05/29/2015   Procedure: RIGHT L2-3 MICRODISCECTOMY;  Surgeon: Jessy Oto, MD;  Location: [place];  Service: Orthopedics;  Laterality: N/A;   TOTAL ABDOMINAL HYSTERECTOMY  1975   partial   UPPER GASTROINTESTINAL ENDOSCOPY     Family History  Problem Relation Age of Onset   Colon cancer Mother 74   Heart attack Father 21       heart attack   Brain cancer Brother        tumor    Heart disease Brother    Ulcers Brother    Arthritis Brother    Kidney disease Daughter    Esophageal cancer Neg Hx    Stomach cancer Neg Hx    Social History   Tobacco Use   Smoking status: Never   Smokeless tobacco: Never  Vaping Use   Vaping Use: Never used  Substance Use Topics   Alcohol use: No   Drug use: Never   Current Outpatient Medications  Medication Sig Dispense Refill   albuterol (PROVENTIL) (2.5 MG/3ML) 0.083% nebulizer solution Take 3 mLs (2.5 mg total) by nebulization every 4 (four) hours as needed for wheezing or shortness of breath. 75 mL 2   albuterol (VENTOLIN HFA) 108 (90 Base) MCG/ACT inhaler INHALE 2 PUFFS BY MOUTH EVERY 6 HOURS AS NEEDED FOR WHEEZING FOR SHORTNESS OF BREATH 9 g 0   ALPRAZolam (XANAX) 0.5 MG tablet Take 1/2 tablet 1- 2 x /day ONLY if needed for Anxiety or Panic Attack &  limit to 5 days /week to avoid Addiction -  DO NOT take with Hydrocodone or Codeine 10 tablet 0   amLODipine (NORVASC) 10 MG tablet Take   1 tablet  Daily for BP & Heart                                                          /                              TAKE                              BY                             MOUTH                         ONCE DAILY 90 tablet 3   aspirin EC 81 MG tablet Take 81 mg by mouth at bedtime. Every other night     budesonide-formoterol (SYMBICORT) 160-4.5 MCG/ACT inhaler Take 2 puffs first thing in am and then another 2 puffs about 12 hours later. 1 each 12   Cholecalciferol (VITAMIN D3) 5000 units CAPS Take 5,000 Units by mouth 2 (two) times daily.      DULoxetine (CYMBALTA) 30 MG capsule TAKE 1 CAPSULE BY MOUTH ONCE DAILY, FOR  MOOD AND CHRONIC PAIN 90 capsule 3   gabapentin (NEURONTIN) 400 MG capsule Take 400 mg by mouth 4 (four) times daily.     ketoconazole (NIZORAL) 2 % cream Apply to  Ringworm rash  2 x /day 60 g 3   Magnesium 400 MG TABS Take 400 mg by mouth daily at 2 PM.     melatonin 5 MG TABS Take 5 mg by mouth at bedtime as needed.     metoprolol tartrate (LOPRESSOR) 50 MG tablet Take 1 tablet  2 x /day(every 12 hours)  for BP & Heart                                /                          TAKE                      BY                   MOUTH 180 tablet 3   Multiple Vitamins-Minerals (MULTIVITAMIN WITH MINERALS) tablet Take 1 tablet by mouth daily.     nitroGLYCERIN (NITROSTAT) 0.4 MG SL tablet Place 1 tablet (0.4 mg total) under the tongue every 5 (five) minutes as needed for chest pain. 90 tablet 3   omeprazole (PRILOSEC) 40 MG capsule Take  1 capsule 2 x /day to Prevent Indigestion & Heartburn 180 capsule 3   predniSONE (DELTASONE) 10 MG tablet Take  4 each am x 2 days,   2 each am x 2 days,  1 each am x 2 days and stop 14 tablet 0   promethazine (PHENERGAN) 25 MG tablet Take  1 tablet  4 x/day  if needed for Nausea & Vomiting 120 tablet 0   senna-docusate (SENOKOT-S) 8.6-50 MG tablet Take  2 tablets by mouth 2 (two) times daily. 60 tablet 1   sucralfate (CARAFATE) 1 g tablet TAKE 1 TABLET BY MOUTH 4 TIMES DAILY BEFORE MEAL(S) AND AT BEDTIME FOR INDIGESTION AND FOR HEARTBURN 360 tablet 3   trospium (SANCTURA) 20 MG tablet Take 20 mg by mouth 2 (two) times daily.     vitamin B-12 (CYANOCOBALAMIN) 1000 MCG tablet Take 5,000 mcg by mouth daily.     No current facility-administered medications for this visit.   Allergies  Allergen Reactions   Ace Inhibitors Cough    Per Dr Melvyn Novas pulmonology 2013   Aspartame Diarrhea and Nausea And Vomiting   Atorvastatin Other (See Comments)    Muscle aches   Biaxin [Clarithromycin] Other (See Comments)    Unknown reaction : PATIENT CAN TOLERATE Z-PAK.  Is written on patient's paper chart.   Daliresp [Roflumilast] Nausea And Vomiting   Erythromycin Other (See Comments)    Unknown reaction: PATIENT CAN TOLERATE Z-PAK.  It is written on patient's paper chart.   Levofloxacin Nausea And Vomiting     Review of Systems: All systems reviewed and negative except where noted in HPI.   Lab Results  Component Value Date   WBC 7.5 03/10/2022   HGB 14.0 03/10/2022   HCT 40.0 03/10/2022   MCV 92.2 03/10/2022   PLT 362 03/10/2022    Lab Results  Component Value Date   CREATININE 1.02 (H) 03/10/2022   BUN 15 03/10/2022   NA 140 03/10/2022   K 4.3 03/10/2022   CL  107 03/10/2022   CO2 22 03/10/2022     Physical Exam: BP 110/66   Pulse 63   Ht '5\' 3"'$  (1.6 m)   Wt 148 lb 4 oz (67.2 kg)   BMI 26.26 kg/m  Constitutional: Pleasant,well-developed, female in no acute distress. Neurological: Alert and oriented to person place and time. Psychiatric: Normal mood and affect. Behavior is normal.   ASSESSMENT: 86 y.o. female here for assessment of the following  1. Dysphagia, unspecified type   2. Gastroesophageal reflux disease, unspecified whether esophagitis present   3. Long-term current use of proton pump inhibitor therapy     Longstanding dysphagia.  This appears to be related to her hernia repair and fundoplication, she has a very angulated turn in her lower esophagus entering the hernia sac.  There is no focal stenosis there based on her last few endoscopies.  Dilation has not provided any long-term benefit.  I discussed her anatomy with her, unfortunately I do not think this is a quick fix amenable to dilation.  She needs to avoid chewing large pieces of chicken or meat if that is what precipitates her symptoms, chew very well and small bites to minimize her symptoms.  Fortunately episodes are rather rare and very much related to her diet, she knows food triggers and will do her best to avoid them, no plans for further EGD at this point time.  I reviewed her history of reflux with her and long-term risks of chronic PPI.  She does have a history of bone fracture, history of osteopenia, we really need to reduce her dose of PPI if she can tolerate it.  I counseled her on this a year ago and she has not tried reducing it.  Recommend going to 40 mg of omeprazole daily and we will see how she does.  She can also stop routine use of Carafate and use that only as needed for breakthrough.  She verbalized understanding of this and agrees.  If she has significant reflux despite once daily omeprazole, she can increase back to twice daily dosing as she needs to but understands risks of doing this especially in regards to her bone health and fracture risk.  PLAN: - monitor diet - watch what she eats and minimize bulky foods, do not think dilation will fix this as above - discussed long term risks of chronic PPI - will reduce omeprazole to '40mg'$  / day from BID dosing. She has osteoporosis and history of falls, counseled on fracture risk - stop routine carafate, and only use PRN - follow up yearly or sooner with any issues.  Jolly Mango, MD [facility]

## 2022-03-22 ENCOUNTER — Ambulatory Visit: Payer: PPO | Admitting: Internal Medicine

## 2022-03-22 ENCOUNTER — Encounter: Payer: Self-pay | Admitting: Internal Medicine

## 2022-03-22 DIAGNOSIS — J4520 Mild intermittent asthma, uncomplicated: Secondary | ICD-10-CM | POA: Diagnosis not present

## 2022-03-22 MED ORDER — ALBUTEROL SULFATE HFA 108 (90 BASE) MCG/ACT IN AERS: INHALATION_SPRAY | RESPIRATORY_TRACT | 11 refills | Status: DC

## 2022-03-22 MED ORDER — PREDNISONE 10 MG PO TABS: ORAL_TABLET | ORAL | 0 refills | Status: DC

## 2022-03-22 MED ORDER — AMOXICILLIN-POT CLAVULANATE 875-125 MG PO TABS: 1.0000 | ORAL_TABLET | Freq: Two times a day (BID) | ORAL | 0 refills | Status: DC

## 2022-03-22 NOTE — Progress Notes (Signed)
Subjective:   Patient ID: [name] [name], female    DOB: 1935-11-29    MRN: [id number]     Brief patient profile:  45  yowf never smoker with large hiatal hernia & recurrent asthmatic bronchitis , about twice/yr for many yrs. Lung parenchyma looks ok on Ct scans 10/2006 & 12/10. PFTs nml in 12/09 & mild airway obstruction in 08/2009.  First presented 3/09 . CT neck for thyroid imaging s/o mediastinal contour abnormal. Ct chest >> large hiatal hernia & BL upper lobe hazy , ground glass opacity.  Dec, 2010  [name])  Fe def anemia requiring transfusions, small bowel AVMs suspected.  Mar 2011 Acute visit- c/o cough and wheezing >> augmentin helped, enalapril changed to diovan     09/08/2017 1st  office visit/ [name]  Re AB  Chief Complaint  Patient presents with   Pulmonary Consult    Last seen here in July 2015. She states that she is here for just "check up". Breathing is overall doing well.  She rarely uses her albuterol inhaler.   uses dulera 200 when over does it  Sleeping s resp problems  No cough  Walks at [employer] at a slow pace with a cane and not really limited by doe   rec In the event of cough/ wheeze or worse short of breath > dulera  100 or 200 up to 2 pffs every 12 hours Work on inhaler technique:   Plan B = Backup Only use your albuterol as a rescue medication Plan C = Crisis - only use your albuterol nebulizer if you first try Plan B and it fails to help > ok to use the nebulizer up to every 4 hours but if start needing it regularly call for immediate appointment      02/25/2022  Acute ov/[name] re: asthma/ doe maint on symbicort 80 2 bid/ max gerd rx poor insight into meds Chief Complaint  Patient presents with   Acute Visit    Increased cough since the last visit- prod with thick, yellow and green sputum. She also c/o wheezing and SOB.   Dyspnea:  still can do lowe's pushing buggy  Cough: worse x one week and turning dark again  Sleeping: on bed blocks does fine  SABA  use: rarely at baseline  02: none  Rec Plan A = Automatic = Always=    Symbicort 160  Take 2 puffs first thing in am and then another 2 puffs about 12 hours later.   Plan B = Backup (to supplement plan A, not to replace it) Only use your albuterol inhaler as a rescue medication  Plan C = Crisis (instead of Plan B but only if Plan B stops working) - only use your albuterol nebulizer if you first try Plan B Prednisone 10 mg take  4 each am x 2 days,   2 each am x 2 days,  1 each am x 2 days and stop  Augmentin 875 mg take one pill twice daily  X 10 days     03/22/2022  f/u ov/[name] re: asthma/gerd   maint on symbicort 160   Chief Complaint  Patient presents with   Follow-up    Cough is some better but has not resolved. She has prod cough with green sputum.    Dyspnea:  baseline  Cough: was much better p augmentin and pred then gradually worse again  Sleeping: on bed blocks s noct awakening but then cough present w/in a few min of stirring in  am  SABA use: rarely uses 02: none    No obvious patterns in day to day or daytime variability or assoc   mucus plugs or hemoptysis or cp or chest tightness, subjective wheeze or overt sinus or hb symptoms.   Sleeping  without nocturnal  or early am exacerbation  of respiratory  c/o's or need for noct saba. Also denies any obvious fluctuation of symptoms with weather or environmental changes or other aggravating or alleviating factors except as outlined above   No unusual exposure hx or h/o childhood pna/ asthma or knowledge of premature birth.  Current Allergies, Complete Past Medical History, Past Surgical History, Family History, and Social History were reviewed in [facility] record.  ROS  The following are not active complaints unless bolded Hoarseness, sore throat, dysphagia, dental problems, itching, sneezing,  nasal congestion or discharge of excess mucus or purulent secretions, ear ache,   fever, chills, sweats,  unintended wt loss or wt gain, classically pleuritic or exertional cp,  orthopnea pnd or arm/hand swelling  or leg swelling, presyncope, palpitations, abdominal pain, anorexia, nausea, vomiting, diarrhea  or change in bowel habits or change in bladder habits, change in stools or change in urine, dysuria, hematuria,  rash, arthralgias, visual complaints, headache, numbness, weakness or ataxia or problems with walking or coordination,  change in mood or  memory.        Current Meds  Medication Sig   albuterol (PROVENTIL) (2.5 MG/3ML) 0.083% nebulizer solution Take 3 mLs (2.5 mg total) by nebulization every 4 (four) hours as needed for wheezing or shortness of breath.   albuterol (VENTOLIN HFA) 108 (90 Base) MCG/ACT inhaler INHALE 2 PUFFS BY MOUTH EVERY 6 HOURS AS NEEDED FOR WHEEZING FOR SHORTNESS OF BREATH   ALPRAZolam (XANAX) 0.5 MG tablet Take 1/2 tablet 1- 2 x /day ONLY if needed for Anxiety or Panic Attack &  limit to 5 days /week to avoid Addiction - DO NOT take with Hydrocodone or Codeine   amLODipine (NORVASC) 10 MG tablet Take   1 tablet  Daily for BP & Heart                                                          /                              TAKE                              BY                             MOUTH                         ONCE DAILY   aspirin EC 81 MG tablet Take 81 mg by mouth at bedtime. Every other night   budesonide-formoterol (SYMBICORT) 160-4.5 MCG/ACT inhaler Take 2 puffs first thing in am and then another 2 puffs about 12 hours later.   Cholecalciferol (VITAMIN D3) 5000 units CAPS Take 5,000 Units by mouth 2 (two) times daily.    DULoxetine (CYMBALTA) 30 MG capsule TAKE 1 CAPSULE  BY MOUTH ONCE DAILY, FOR MOOD AND CHRONIC PAIN   gabapentin (NEURONTIN) 400 MG capsule Take 400 mg by mouth 4 (four) times daily.   ketoconazole (NIZORAL) 2 % cream Apply to  Ringworm rash  2 x /day   Magnesium 400 MG TABS Take 400 mg by mouth daily at 2 PM.   melatonin 5 MG TABS Take 5 mg by  mouth at bedtime as needed.   metoprolol tartrate (LOPRESSOR) 50 MG tablet Take 1 tablet  2 x /day(every 12 hours)  for BP & Heart                                /                          TAKE                      BY                   MOUTH   Multiple Vitamins-Minerals (MULTIVITAMIN WITH MINERALS) tablet Take 1 tablet by mouth daily.   nitroGLYCERIN (NITROSTAT) 0.4 MG SL tablet Place 1 tablet (0.4 mg total) under the tongue every 5 (five) minutes as needed for chest pain.   omeprazole (PRILOSEC) 40 MG capsule Take 1 capsule (40 mg total) by mouth daily. Take  1 capsule 2 x /day to Prevent Indigestion & Heartburn   promethazine (PHENERGAN) 25 MG tablet Take  1 tablet  4 x/day  if needed for Nausea & Vomiting   senna-docusate (SENOKOT-S) 8.6-50 MG tablet Take 2 tablets by mouth 2 (two) times daily.   trospium (SANCTURA) 20 MG tablet Take 20 mg by mouth 2 (two) times daily.   vitamin B-12 (CYANOCOBALAMIN) 1000 MCG tablet Take 5,000 mcg by mouth daily.                Objective:   Physical Exam  Wts    03/22/2022       144   02/25/2022      147  01/28/2022        148 12/14/2021          144   05/12/2021          143  10/14/2020          150 09/02/2020        152   09/08/2017         164   11/19/13 132 lb (59.875 kg)  11/14/13 127 lb (57.607 kg)  11/05/13 125 lb (56.7 kg)     Vital signs reviewed  03/22/2022  - Note at rest 02 sats  96% on RA   General appearance:    amb wf nasal tone to voice/ congested cough     HEENT : Oropharynx  clear   Nasal turbinates mod nonspecific edema    NECK :  without  apparent JVD/ palpable Nodes/TM    LUNGS: no acc muscle use,  Min barrel  contour chest wall with bilateral  slightly decreased bs s audible wheeze and  without cough on insp or exp maneuvers and min  Hyperresonant  to  percussion bilaterally    CV:  RRR  no s3 or murmur or increase in P2, and no edema   ABD:  soft and nontender with pos end  insp Hoover's  in the supine position.  No  bruits or organomegaly appreciated   MS:  Nl gait/ ext warm without deformities Or obvious joint restrictions  calf tenderness, cyanosis or clubbing     SKIN: warm and dry without lesions    NEURO:  alert, approp, nl sensorium with  no motor or cerebellar deficits apparent.              Assessment & Plan:

## 2022-03-22 NOTE — Patient Instructions (Addendum)
Plan A = Automatic = Always=    Symbicort 160  Take 2 puffs first thing in am and then another 2 puffs about 12 hours later.     Plan B = Backup (to supplement plan A, not to replace it) Only use your albuterol inhaler as a rescue medication to be used if you can't catch your breath by resting or doing a relaxed purse lip breathing pattern.  - The less you use it, the better it will work when you need it. - Ok to use the inhaler up to 2 puffs  every 4 hours if you must but call for appointment if use goes up over your usual need - Don't leave home without it !!  (think of it like the spare tire for your car)   Plan C = Crisis (instead of Plan B but only if Plan B stops working) - only use your albuterol nebulizer if you first try Plan B and it fails to help > ok to use the nebulizer up to every 4 hours but if start needing it regularly call for immediate appointment   Prednisone 10 mg take  4 each am x 2 days,   2 each am x 2 days,  1 each am x 2 days and stop   Augmentin 875 mg take one pill twice daily  X 14 days - take at breakfast and supper with large glass of water.  It would help reduce the usual side effects (diarrhea and yeast infections) if you ate cultured yogurt at lunch.   Please schedule a follow up office visit in 6 weeks, call sooner if needed bring inhaler/spacer /solutions with you including over the counter medications other than vitamins

## 2022-03-22 NOTE — Assessment & Plan Note (Signed)
09/08/2017  After extensive coaching inhaler device  effectiveness =    75% > continue dulera 100 2 bid prn  - Spirometry 09/08/2017  wnl with min curvature off rx  -  PFT's  09/02/2020  FEV1 1.38 (73 % ) ratio 0.77  p 11 % improvement from saba p advair 250  prior to study with DLCO  19.41 (103%) corrects to 5.38 (131%)  for alv volume and FV curve minimally concave  - 10/14/2020  try symbicort 80 2 bid   - 02/25/2022  increase symbicort to 160 2bid for freq flare on symb 80 though ? Compliance?  -  03/22/2022  After extensive coaching inhaler device,  effectiveness =    75% with spacer   Improved but not back to baseline so repeat prior rx but extend to 14 d augmentin this time along with more effective symbicort rx and if still having large component of upper airway symptoms next steps are empirical gerd rx/  sinus ct or ent f/u    Discussed in detail all the  indications, usual  risks and alternatives  relative to the benefits with patient who agrees to proceed with Rx as outlined.             Each maintenance medication was reviewed in detail including emphasizing most importantly the difference between maintenance and prns and under what circumstances the prns are to be triggered using an action plan format where appropriate.  Total time for H and P, chart review, counseling, reviewing hfa/spacer/neb device(s) and generating customized AVS unique to this office visit / same day charting = 34 min

## 2022-04-11 DIAGNOSIS — R0781 Pleurodynia: Secondary | ICD-10-CM | POA: Diagnosis not present

## 2022-04-12 ENCOUNTER — Other Ambulatory Visit: Payer: Self-pay | Admitting: Internal Medicine

## 2022-04-12 DIAGNOSIS — R110 Nausea: Secondary | ICD-10-CM

## 2022-04-14 ENCOUNTER — Encounter: Payer: Self-pay | Admitting: Internal Medicine

## 2022-04-27 ENCOUNTER — Other Ambulatory Visit: Payer: Self-pay | Admitting: Internal Medicine

## 2022-04-27 ENCOUNTER — Other Ambulatory Visit: Payer: Self-pay | Admitting: Adult Health Nurse Practitioner

## 2022-04-27 DIAGNOSIS — M62838 Other muscle spasm: Secondary | ICD-10-CM

## 2022-05-03 DIAGNOSIS — R0781 Pleurodynia: Secondary | ICD-10-CM | POA: Diagnosis not present

## 2022-05-10 ENCOUNTER — Ambulatory Visit (INDEPENDENT_AMBULATORY_CARE_PROVIDER_SITE_OTHER): Payer: PPO | Admitting: Nurse Practitioner

## 2022-05-10 ENCOUNTER — Encounter: Payer: Self-pay | Admitting: Nurse Practitioner

## 2022-05-10 VITALS — BP 130/74 | HR 56 | Temp 97.2°F | Ht 63.0 in | Wt 143.6 lb

## 2022-05-10 DIAGNOSIS — R0981 Nasal congestion: Secondary | ICD-10-CM | POA: Diagnosis not present

## 2022-05-10 DIAGNOSIS — R071 Chest pain on breathing: Secondary | ICD-10-CM

## 2022-05-10 DIAGNOSIS — R051 Acute cough: Secondary | ICD-10-CM | POA: Diagnosis not present

## 2022-05-10 DIAGNOSIS — I10 Essential (primary) hypertension: Secondary | ICD-10-CM

## 2022-05-10 DIAGNOSIS — M549 Dorsalgia, unspecified: Secondary | ICD-10-CM | POA: Diagnosis not present

## 2022-05-10 MED ORDER — DEXAMETHASONE SODIUM PHOSPHATE 10 MG/ML IJ SOLN
10.0000 mg | Freq: Once | INTRAMUSCULAR | Status: AC
  Administered 2022-05-10: 10 mg via INTRAMUSCULAR

## 2022-05-10 NOTE — Progress Notes (Signed)
Assessment and Plan:  [name] [name] was seen today for an episodic visit.  Diagnoses and all order for this visit:  Mid back pain on right side Continue to rest Requested Medical Records from [facility] for review. Discussed Tylenol 1,000 mg up to TID. Decadron injection administered - patient tolerated well.   - dexamethasone (DECADRON) injection 10 mg  Chest pain varying with breathing Any increase in SOB, hemoptysis report to ER or call 911.  Continue to monitor  Acute cough Stay well hydrated to keep mucus thin and productive. Suggested Coricidin BP  Nasal congestion Stay well hydrated   Essential hypertension Discussed DASH (Dietary Approaches to Stop Hypertension) DASH diet is lower in sodium than a typical American diet. Cut back on foods that are high in saturated fat, cholesterol, and trans fats. Eat more whole-grain foods, fish, poultry, and nuts Remain active and exercise as tolerated daily.  Monitor BP at home-Call if greater than 130/80.  Check CMP/CBC  Meds ordered this encounter  Medications   dexamethasone (DECADRON) injection 10 mg   Notify office for further evaluation and treatment, questions or concerns if s/s fail to improve. The risks and benefits of my recommendations, as well as other treatment options were discussed with the patient today. Questions were answered.  Further disposition pending results of labs. Discussed med's effects and SE's.    Over 20 minutes of exam, counseling, chart review, and critical decision making was performed.   Future Appointments  Date Time Provider [place]  05/16/2022 10:15 AM [name], MD [facility] None  07/22/2022  9:30 AM Darrol Jump, NP [facility] None  10/31/2022 10:00 AM Alycia Rossetti, NP [facility] None    ------------------------------------------------------------------------------------------------------------------   HPI BP 130/74   Pulse (!) 56   Temp (!)  97.2 F (36.2 C)   Ht '5\' 3"'$  (1.6 m)   Wt 143 lb 9.6 oz (65.1 kg)   SpO2 99%   BMI 25.44 kg/m   87 y.o.female presents for evaluation of upper right sided back pain.  She shares that approximately 1 month ago she was in her bathroom bending over to place lotion on her legs when she fell over.  She does not remember falling.  Her daughter was in the home at the time, however, the fall was unwitnessed.  The patient waited until the next day to be worked up.  This is when she states that she visit [facility] and they completed x-ray imaging of her ribs and back.  She did not sustain a fracture or break to the ribs or spine.  She continues to have pain in the right upper posterior back area.  She has been using a lidocaine patch.  Hurts for her to take a deep breath.  She followed up with [facility] last week with updated imaging that continued to be negative.  She has been cleared from their services.  She has been taking Gabapentin and Tylenol PRN.    She also reports lingering cough from sinus infection over Christmas.  She has been taking OTC antihistamine without much relief.  Denies fever chills, N/V.    Blood pressure has been well controlled.  BP Readings from Last 3 Encounters:  05/10/22 130/74  03/22/22 116/78  03/11/22 110/66     Past Medical History:  Diagnosis Date   Anxiety    takes Xanax daily as needed   Asthma    Albuterol daily as needed   Blood transfusion without reported diagnosis  CAD (coronary artery disease)    LHC 2003 with 40% pLAD, 30% mLAD, 100% D1 (moderate vessel), 100%  D2 (small vessel), 95% mid small OM2.  Pt had PTCA to D1;  D2 and OM2 small vessels and tx medically;  Last Myoview (2012): anterior infarct seen with scar, no ischemia. EF normal. Med Rx // Myoview (12/14):  Low risk; ant scar w/ peri-infarct ischemia; EF 55% with ant AK // Myoview 5/16: EF 55-65, ant, apical scar, no ischemia    Cataract    Chronic back pain    HNP, DDD,  spinal stenosis, vertebral compression fractures.    Chronic nausea    takes Zofran daily as needed.  normal gastric emptying study 03/2013   CKD (chronic kidney disease), stage III ([place]) 09/27/2017   Constipation    felt to be functional. takes Senokot and Miralax daily.  treated with Linzess in past.    DIVERTICULOSIS, COLON 08/28/2008   Qualifier: Diagnosis of  By: [name], [name], [name]     Elevated LFTs 2015   probably med induced DILI, from Augmentin vs Pravastatin   GERD (gastroesophageal reflux disease)    hx of esophageal stricture    Hiatal hernia    Paraesophageal hernias. s/p fundoplications in 3016 and 04/2013   History of bronchitis several of yrs ago   History of colon polyps 03/2009, 03/2011   adenomatous colon polyps, no high grade dysplasia.    History of vertigo    no meds   Hyperlipidemia    takes Pravastatin daily   Hypertension    takes Amlodipine,Metoprolol,and Losartan daily   Multiple closed pelvic fractures without disruption of pelvic circle (HCC)    Nausea 03/2016   Osteoporosis    PONV (postoperative nausea and vomiting)    yrs ago   Pubic ramus fracture ([place]) 12/20/2019   Slow urinary stream    takes Rapaflo daily   TIA (transient ischemic attack) 1995   no residual effects noted   Vertebral compression fracture (HCC) 06/23/2012     Allergies  Allergen Reactions   Ace Inhibitors Cough    Per Dr Melvyn Novas pulmonology 2013   Aspartame Diarrhea and Nausea And Vomiting   Atorvastatin Other (See Comments)    Muscle aches   Biaxin [Clarithromycin] Other (See Comments)    Unknown reaction : PATIENT CAN TOLERATE Z-PAK.  Is written on patient's paper chart.   Daliresp [Roflumilast] Nausea And Vomiting   Erythromycin Other (See Comments)    Unknown reaction: PATIENT CAN TOLERATE Z-PAK.  It is written on patient's paper chart.   Levofloxacin Nausea And Vomiting    Current Outpatient Medications on File Prior to Visit  Medication Sig   albuterol (PROVENTIL)  (2.5 MG/3ML) 0.083% nebulizer solution Take 3 mLs (2.5 mg total) by nebulization every 4 (four) hours as needed for wheezing or shortness of breath.   albuterol (VENTOLIN HFA) 108 (90 Base) MCG/ACT inhaler INHALE 2 PUFFS BY MOUTH EVERY 4 HOURS AS NEEDED FOR WHEEZING FOR SHORTNESS OF BREATH   ALPRAZolam (XANAX) 0.5 MG tablet Take 1/2 tablet 1- 2 x /day ONLY if needed for Anxiety or Panic Attack &  limit to 5 days /week to avoid Addiction - DO NOT take with Hydrocodone or Codeine   amLODipine (NORVASC) 10 MG tablet Take   1 tablet  Daily for BP & Heart                                                          /  TAKE                              BY                             MOUTH                         ONCE DAILY   amoxicillin-clavulanate (AUGMENTIN) 875-125 MG tablet Take 1 tablet by mouth 2 (two) times daily.   aspirin EC 81 MG tablet Take 81 mg by mouth at bedtime. Every other night   budesonide-formoterol (SYMBICORT) 160-4.5 MCG/ACT inhaler Take 2 puffs first thing in am and then another 2 puffs about 12 hours later.   Cholecalciferol (VITAMIN D3) 5000 units CAPS Take 5,000 Units by mouth 2 (two) times daily.    DULoxetine (CYMBALTA) 30 MG capsule TAKE 1 CAPSULE BY MOUTH ONCE DAILY, FOR MOOD AND CHRONIC PAIN   gabapentin (NEURONTIN) 400 MG capsule Take 400 mg by mouth 4 (four) times daily.   ketoconazole (NIZORAL) 2 % cream Apply to  Ringworm rash  2 x /day   Magnesium 400 MG TABS Take 400 mg by mouth daily at 2 PM.   melatonin 5 MG TABS Take 5 mg by mouth at bedtime as needed.   metoprolol tartrate (LOPRESSOR) 50 MG tablet Take 1 tablet  2 x /day(every 12 hours)  for BP & Heart                                /                          TAKE                      BY                   MOUTH   Multiple Vitamins-Minerals (MULTIVITAMIN WITH MINERALS) tablet Take 1 tablet by mouth daily.   nitroGLYCERIN (NITROSTAT) 0.4 MG SL tablet Place 1 tablet (0.4 mg total) under the  tongue every 5 (five) minutes as needed for chest pain.   omeprazole (PRILOSEC) 40 MG capsule Take 1 capsule (40 mg total) by mouth daily. Take  1 capsule 2 x /day to Prevent Indigestion & Heartburn   predniSONE (DELTASONE) 10 MG tablet Take  4 each am x 2 days,   2 each am x 2 days,  1 each am x 2 days and stop   promethazine (PHENERGAN) 25 MG tablet TAKE 1 TABLET BY MOUTH 4 TIMES DAILY IF  NEEDED  FOR  NAUSEA  AND  VOMITING.   senna-docusate (SENOKOT-S) 8.6-50 MG tablet Take 2 tablets by mouth 2 (two) times daily.   trospium (SANCTURA) 20 MG tablet Take 20 mg by mouth 2 (two) times daily.   vitamin B-12 (CYANOCOBALAMIN) 1000 MCG tablet Take 5,000 mcg by mouth daily.   No current facility-administered medications on file prior to visit.    ROS: all negative except what is noted in the HPI.   Physical Exam:  BP 130/74   Pulse (!) 56   Temp (!) 97.2 F (36.2 C)   Ht '5\' 3"'$  (1.6 m)   Wt 143 lb 9.6 oz (65.1 kg)   SpO2 99%  BMI 25.44 kg/m   General Appearance: NAD.  Awake, conversant and cooperative. Eyes: PERRLA, EOMs intact.  Sclera white.  Conjunctiva without erythema. Sinuses: No frontal/maxillary tenderness.  No nasal discharge. Nares patent.  ENT/Mouth: Ext aud canals clear.  Bilateral TMs w/DOL and without erythema or bulging. Hearing intact.  Posterior pharynx without swelling or exudate.  Tonsils without swelling or erythema.  Neck: Supple.  No masses, nodules or thyromegaly. Respiratory: Effort is regular with non-labored breathing. Breath sounds are equal bilaterally without rales, rhonchi, wheezing or stridor.  Cardio: RRR with no MRGs. Brisk peripheral pulses without edema.  Abdomen: Active BS in all four quadrants.  Soft and non-tender without guarding, rebound tenderness, hernias or masses. Lymphatics: Non tender without lymphadenopathy.  Musculoskeletal: Full ROM, 5/5 strength, normal ambulation.  Right posterior upper back with tenderness to palpation on the T 7/8/9  area.  Lidocaine patch in place. Skin: Appropriate color for ethnicity. Warm without rashes, lesions, ecchymosis, ulcers.  Neuro: CN II-XII grossly normal. Normal muscle tone without cerebellar symptoms and intact sensation.   Psych: AO X 3,  appropriate mood and affect, insight and judgment.     Darrol Jump, NP 11:44 AM [facility]

## 2022-05-10 NOTE — Patient Instructions (Addendum)
Coricidin BP for cough.  May take 1,000 mg Tylenol up to three times a day for pain.  Acute Back Pain, Adult Acute back pain is sudden and usually short-lived. It is often caused by an injury to the muscles and tissues in the back. The injury may result from: A muscle, tendon, or ligament getting overstretched or torn. Ligaments are tissues that connect bones to each other. Lifting something improperly can cause a back strain. Wear and tear (degeneration) of the spinal disks. Spinal disks are circular tissue that provide cushioning between the bones of the spine (vertebrae). Twisting motions, such as while playing sports or doing yard work. A hit to the back. Arthritis. You may have a physical exam, lab tests, and imaging tests to find the cause of your pain. Acute back pain usually goes away with rest and home care. Follow these instructions at home: Managing pain, stiffness, and swelling Take over-the-counter and prescription medicines only as told by your health care provider. Treatment may include medicines for pain and inflammation that are taken by mouth or applied to the skin, or muscle relaxants. Your health care provider may recommend applying ice during the first 24-48 hours after your pain starts. To do this: Put ice in a plastic bag. Place a towel between your skin and the bag. Leave the ice on for 20 minutes, 2-3 times a day. Remove the ice if your skin turns bright red. This is very important. If you cannot feel pain, heat, or cold, you have a greater risk of damage to the area. If directed, apply heat to the affected area as often as told by your health care provider. Use the heat source that your health care provider recommends, such as a moist heat pack or a heating pad. Place a towel between your skin and the heat source. Leave the heat on for 20-30 minutes. Remove the heat if your skin turns bright red. This is especially important if you are unable to feel pain, heat, or  cold. You have a greater risk of getting burned. Activity  Do not stay in bed. Staying in bed for more than 1-2 days can delay your recovery. Sit up and stand up straight. Avoid leaning forward when you sit or hunching over when you stand. If you work at a desk, sit close to it so you do not need to lean over. Keep your chin tucked in. Keep your neck drawn back, and keep your elbows bent at a 90-degree angle (right angle). Sit high and close to the steering wheel when you drive. Add lower back (lumbar) support to your car seat, if needed. Take short walks on even surfaces as soon as you are able. Try to increase the length of time you walk each day. Do not sit, drive, or stand in one place for more than 30 minutes at a time. Sitting or standing for long periods of time can put stress on your back. Do not drive or use heavy machinery while taking prescription pain medicine. Use proper lifting techniques. When you bend and lift, use positions that put less stress on your back: [name] your knees. Keep the load close to your body. Avoid twisting. Exercise regularly as told by your health care provider. Exercising helps your back heal faster and helps prevent back injuries by keeping muscles strong and flexible. Work with a physical therapist to make a safe exercise program, as recommended by your health care provider. Do any exercises as told by your physical therapist.  Lifestyle Maintain a healthy weight. Extra weight puts stress on your back and makes it difficult to have good posture. Avoid activities or situations that make you feel anxious or stressed. Stress and anxiety increase muscle tension and can make back pain worse. Learn ways to manage anxiety and stress, such as through exercise. General instructions Sleep on a firm mattress in a comfortable position. Try lying on your side with your knees slightly bent. If you lie on your back, put a pillow under your knees. Keep your head and neck in  a straight line with your spine (neutral position) when using electronic equipment like smartphones or pads. To do this: Raise your smartphone or pad to look at it instead of bending your head or neck to look down. Put the smartphone or pad at the level of your face while looking at the screen. Follow your treatment plan as told by your health care provider. This may include: Cognitive or behavioral therapy. Acupuncture or massage therapy. Meditation or yoga. Contact a health care provider if: You have pain that is not relieved with rest or medicine. You have increasing pain going down into your legs or buttocks. Your pain does not improve after 2 weeks. You have pain at night. You lose weight without trying. You have a fever or chills. You develop nausea or vomiting. You develop abdominal pain. Get help right away if: You develop new bowel or bladder control problems. You have unusual weakness or numbness in your arms or legs. You feel faint. These symptoms may represent a serious problem that is an emergency. Do not wait to see if the symptoms will go away. Get medical help right away. Call your local emergency services (911 in the [place] Do not drive yourself to the hospital. Summary Acute back pain is sudden and usually short-lived. Use proper lifting techniques. When you bend and lift, use positions that put less stress on your back. Take over-the-counter and prescription medicines only as told by your health care provider, and apply heat or ice as told. This information is not intended to replace advice given to you by your health care provider. Make sure you discuss any questions you have with your health care provider. Document Revised: 07/17/2020 Document Reviewed: 07/17/2020 Elsevier Patient Education  [place].

## 2022-05-15 NOTE — Progress Notes (Unsigned)
Subjective:   Patient ID: [name] [name], female    DOB: 08-23-1935    MRN: [id number]     Brief patient profile:  58  yowf never smoker with large hiatal hernia & recurrent asthmatic bronchitis , about twice/yr for many yrs. Lung parenchyma looks ok on Ct scans 10/2006 & 12/10. PFTs nml in 12/09 & mild airway obstruction in 08/2009.  First presented 3/09 . CT neck for thyroid imaging s/o mediastinal contour abnormal. Ct chest >> large hiatal hernia & BL upper lobe hazy , ground glass opacity.  Dec, 2010  [name] [name])  Fe def anemia requiring transfusions, small bowel AVMs suspected.  Mar 2011 Acute visit- c/o cough and wheezing >> augmentin helped, enalapril changed to diovan     09/08/2017 1st  office visit/ [name] [name]  Re AB  Chief Complaint  Patient presents with   Pulmonary Consult    Last seen here in July 2015. She states that she is here for just "check up". Breathing is overall doing well.  She rarely uses her albuterol inhaler.   uses dulera 200 when over does it  Sleeping s resp problems  No cough  Walks at [employer] at a slow pace with a cane and not really limited by doe   rec In the event of cough/ wheeze or worse short of breath > dulera  100 or 200 up to 2 pffs every 12 hours Work on inhaler technique:   Plan B = Backup Only use your albuterol as a rescue medication Plan C = Crisis - only use your albuterol nebulizer if you first try Plan B and it fails to help > ok to use the nebulizer up to every 4 hours but if start needing it regularly call for immediate appointment      02/25/2022  Acute ov/[name] re: asthma/ doe maint on symbicort 80 2 bid/ max gerd rx poor insight into meds Chief Complaint  Patient presents with   Acute Visit    Increased cough since the last visit- prod with thick, yellow and green sputum. She also c/o wheezing and SOB.   Dyspnea:  still can do lowe's pushing buggy  Cough: worse x one week and turning dark again  Sleeping: on bed blocks does fine  SABA  use: rarely at baseline  02: none  Rec Plan A = Automatic = Always=    Symbicort 160  Take 2 puffs first thing in am and then another 2 puffs about 12 hours later.   Plan B = Backup (to supplement plan A, not to replace it) Only use your albuterol inhaler as a rescue medication  Plan C = Crisis (instead of Plan B but only if Plan B stops working) - only use your albuterol nebulizer if you first try Plan B Prednisone 10 mg take  4 each am x 2 days,   2 each am x 2 days,  1 each am x 2 days and stop  Augmentin 875 mg take one pill twice daily  X 10 days     03/22/2022  f/u ov/[name] [name] re: asthma/gerd   maint on symbicort 160   Chief Complaint  Patient presents with   Follow-up    Cough is some better but has not resolved. She has prod cough with green sputum.    Dyspnea:  baseline  Cough: was much better p augmentin and pred then gradually worse again  Sleeping: on bed blocks s noct awakening but then cough present w/in a few min of stirring in  am  SABA use: rarely uses 02: none  Rec Plan A = Automatic = Always=    Symbicort 160  Take 2 puffs first thing in am and then another 2 puffs about 12 hours later.  Plan B = Backup (to supplement plan A, not to replace it) Only use your albuterol inhaler as a rescue medication  Plan C = Crisis (instead of Plan B but only if Plan B stops working) - only use your albuterol nebulizer if you first try Plan B  Prednisone 10 mg take  4 each am x 2 days,   2 each am x 2 days,  1 each am x 2 days and stop  Augmentin 875 mg take one pill twice daily  X 14 days  Please schedule a follow up office visit in 6 weeks, call sooner if needed bring inhaler/spacer /solutions with you including over the counter medications other than vitamins     05/16/2022  f/u ov/[name] [name] re: ***   maint on ***  No chief complaint on file.   Dyspnea:  *** Cough: *** Sleeping: *** SABA use: *** 02: *** Covid status:   *** Lung cancer screening :  ***    No obvious day to day  or daytime variability or assoc excess/ purulent sputum or mucus plugs or hemoptysis or cp or chest tightness, subjective wheeze or overt sinus or hb symptoms.   *** without nocturnal  or early am exacerbation  of respiratory  c/o's or need for noct saba. Also denies any obvious fluctuation of symptoms with weather or environmental changes or other aggravating or alleviating factors except as outlined above   No unusual exposure hx or h/o childhood pna/ asthma or knowledge of premature birth.  Current Allergies, Complete Past Medical History, Past Surgical History, Family History, and Social History were reviewed in [employer] record.  ROS  The following are not active complaints unless bolded Hoarseness, sore throat, dysphagia, dental problems, itching, sneezing,  nasal congestion or discharge of excess mucus or purulent secretions, ear ache,   fever, chills, sweats, unintended wt loss or wt gain, classically pleuritic or exertional cp,  orthopnea pnd or arm/hand swelling  or leg swelling, presyncope, palpitations, abdominal pain, anorexia, nausea, vomiting, diarrhea  or change in bowel habits or change in bladder habits, change in stools or change in urine, dysuria, hematuria,  rash, arthralgias, visual complaints, headache, numbness, weakness or ataxia or problems with walking or coordination,  change in mood or  memory.        No outpatient medications have been marked as taking for the 05/16/22 encounter (Appointment) with [name], MD.                  Objective:   Physical Exam  Wts    05/16/2022           ***  03/22/2022       144   02/25/2022      147  01/28/2022        148 12/14/2021          144   05/12/2021          143  10/14/2020          150 09/02/2020        152   09/08/2017         164   11/19/13 132 lb (59.875 kg)  11/14/13 127 lb (57.607 kg)  11/05/13 125 lb (56.7 kg)  Vital signs reviewed  05/16/2022  - Note at rest 02 sats  ***% on ***    General appearance:    ***   Min bar***         Assessment & Plan:

## 2022-05-16 ENCOUNTER — Ambulatory Visit: Payer: PPO | Admitting: Internal Medicine

## 2022-05-16 ENCOUNTER — Encounter: Payer: Self-pay | Admitting: Internal Medicine

## 2022-05-16 VITALS — BP 124/72 | HR 61 | Temp 98.4°F | Ht 63.0 in | Wt 142.2 lb

## 2022-05-16 DIAGNOSIS — J4520 Mild intermittent asthma, uncomplicated: Secondary | ICD-10-CM

## 2022-05-16 MED ORDER — PREDNISONE 10 MG PO TABS: ORAL_TABLET | ORAL | 0 refills | Status: DC

## 2022-05-16 NOTE — Patient Instructions (Addendum)
Prednisone 10 mg take  4 each am x 2 days,   2 each am x 2 days,  1 each am x 2 days and stop   For cough / congestion > stop robitussin and start mucinex dm 1200 mg every 12 hours as needed  Plan A = Automatic = Always=   Symbicort 160 Take 2 puffs first thing in am and then another 2 puffs about 12 hours later.    Work on inhaler technique:  relax and gently blow all the way out then take a nice smooth full deep breath back in, triggering the inhaler  a split second  before you start breathing in.  Hold breath in for at least  5 seconds if you can. Blow out symbicort  thru nose. Rinse and gargle with water when done.  If mouth or throat bother you at all,  try brushing teeth/gums/tongue with arm and hammer toothpaste/ make a slurry and gargle and spit out.      Plan B = Backup (to supplement plan A, not to replace it) Only use your albuterol inhaler as a rescue medication to be used if you can't catch your breath by resting or doing a relaxed purse lip breathing pattern.  - The less you use it, the better it will work when you need it. - Ok to use the inhaler up to 2 puffs  every 4 hours if you must but call for appointment if use goes up over your usual need - Don't leave home without it !!  (think of it like the spare tire for your car)   Plan C = Crisis (instead of Plan B but only if Plan B stops working) - only use your albuterol nebulizer if you first try Plan B and it fails to help > ok to use the nebulizer up to every 4 hours but if start needing it regularly call for immediate appointment   Please schedule a follow up office visit in 6 weeks, call sooner if needed - bring your symbicort and spacer

## 2022-05-17 ENCOUNTER — Encounter: Payer: Self-pay | Admitting: Internal Medicine

## 2022-05-17 NOTE — Assessment & Plan Note (Addendum)
09/08/2017  After extensive coaching inhaler device  effectiveness =    75% > continue dulera 100 2 bid prn  - Spirometry 09/08/2017  wnl with min curvature off rx  -  PFT's  09/02/2020  FEV1 1.38 (73 % ) ratio 0.77  p 11 % improvement from saba p advair 250  prior to study with DLCO  19.41 (103%) corrects to 5.38 (131%)  for alv volume and FV curve minimally concave  - 10/14/2020  try symbicort 80 2 bid   - 02/25/2022  increase symbicort to 160 2bid for freq flare on symb 80 though ? Compliance?  - 05/16/2022  After extensive coaching inhaler device,  effectiveness =    75% with spacer form a baseline of 25%   Mild flare of AB/ cough > rx  Prednisone 10 mg take  4 each am x 2 days,   2 each am x 2 days,  1 each am x 2 days and stop but no change in rx - return with all meds in hand using a trust but verify approach to confirm accurate Medication  Reconciliation The principal here is that until we are certain that the  patients are doing what we've asked, it makes no sense to ask them to do more.   Reviewed again ABC action plan if flares again between ov's           Each maintenance medication was reviewed in detail including emphasizing most importantly the difference between maintenance and prns and under what circumstances the prns are to be triggered using an action plan format where appropriate.  Total time for H and P, chart review, counseling, reviewing hfa/neb  device(s) and generating customized AVS unique to this office visit / same day charting = 25 min

## 2022-05-23 ENCOUNTER — Encounter: Payer: Self-pay | Admitting: Internal Medicine

## 2022-05-23 ENCOUNTER — Ambulatory Visit (INDEPENDENT_AMBULATORY_CARE_PROVIDER_SITE_OTHER): Payer: PPO | Admitting: Internal Medicine

## 2022-05-23 VITALS — BP 120/68 | HR 70 | Temp 97.9°F | Resp 17 | Ht 63.0 in | Wt 143.4 lb

## 2022-05-23 DIAGNOSIS — R5383 Other fatigue: Secondary | ICD-10-CM | POA: Diagnosis not present

## 2022-05-23 DIAGNOSIS — R7309 Other abnormal glucose: Secondary | ICD-10-CM | POA: Diagnosis not present

## 2022-05-23 DIAGNOSIS — K219 Gastro-esophageal reflux disease without esophagitis: Secondary | ICD-10-CM

## 2022-05-23 DIAGNOSIS — Z79899 Other long term (current) drug therapy: Secondary | ICD-10-CM

## 2022-05-23 DIAGNOSIS — J42 Unspecified chronic bronchitis: Secondary | ICD-10-CM

## 2022-05-23 DIAGNOSIS — I10 Essential (primary) hypertension: Secondary | ICD-10-CM | POA: Diagnosis not present

## 2022-05-23 DIAGNOSIS — J0100 Acute maxillary sinusitis, unspecified: Secondary | ICD-10-CM

## 2022-05-23 DIAGNOSIS — J4541 Moderate persistent asthma with (acute) exacerbation: Secondary | ICD-10-CM

## 2022-05-23 MED ORDER — AZITHROMYCIN 250 MG PO TABS: ORAL_TABLET | ORAL | 1 refills | Status: DC

## 2022-05-23 NOTE — Progress Notes (Signed)
Future Appointments  Date Time Provider Department  06/28/2022 10:15 AM [name], MD [facility]  07/22/2022  9:30 AM Darrol Jump, NP [facility]  10/31/2022 10:00 AM Alycia Rossetti, NP [facility]    History of Present Illness:       This very nice  MWF  with HTN, CKD3a, HLD, COPD/Asthma, GERD, Pre-Diabetes and Vitamin D Deficiency presents with a 1-2 week prodrome of increasing fatigue and rapid fatigue & DOE with short distances , but no CP /Discomfort.. She was seen 1 week ago & rx'd a prednisone taper - presumed for asthmatic flare. She describes thick green sputum "like glue".    Also note patient's last A1c had risen to 6.2% in Nov 2023.    Current Outpatient Medications on File Prior to Visit  Medication Sig   albuterol (2.5 MG/3ML) 0.083% neb soln Take 3 mLs (2.5 mg ) by nebulization every 4 hours as needed for wheezing or shortness of breath.   albuterol HFA inhaler INHALE 2 PUFFS  EVERY 4 HOURS AS NEEDED FOR WHEEZING FOR SHORTNESS OF BREATH   ALPRAZolam  0.5 MG tablet Take 1/2 tablet 1- 2 x /day ONLY if needed for Anxiety or Panic Attack &  limit to 5 days /week to avoid Addiction - DO NOT take with Hydrocodone or Codeine   amLODipine (NORVASC) 10 MG tablet Take   1 tablet  Daily for BP & Heart        aspirin EC 81 MG tablet Take Every other night   budesonide-formoterol (SYMBICORT) 160-4.5 MCG/ACT inhaler Take 2 puffs first thing in am and then another 2 puffs about 12 hours later.   Cholecalciferol (VITAMIN D3) 5000 units CAPS Take 5,000 Units  2 times daily.    DULoxetine (CYMBALTA) 30 MG capsule TAKE 1 CAPSULE DAILY, FOR MOOD AND CHRONIC PAIN   gabapentin (NEURONTIN) 400 MG capsule Take 4 times daily.   ketoconazole (NIZORAL) 2 % cream Apply to  Ringworm rash  2 x /day   Magnesium 400 MG TABS Take daily at 2 PM.   melatonin 5 MG TABS Take  at bedtime as needed.   metoprolol tartrate (LOPRESSOR) 50 MG tablet Take 1 tablet  2 x /day (every 12 hours)      Multiple Vitamins-Minerals ( Take 1 tablet by mouth daily.   NITROSTAT 0.4 MG SL t as needed for chest pain.   omeprazole (PRILOSEC) 40 MG capsule Take  1 capsule 2 x /day to Prevent Indigestion & Heartburn   promethazine (PHENERGAN) 25 MG tablet TAKE 1 TABLET 4 TIMES DAILY IF  NEEDED  FOR  NAUSEA  AND  VOMITING.   senna-docusate (SENOKOT-S) 8.6-50 MG tablet Take 2 tablets  2 (two) times daily.   trospium (SANCTURA) 20 MG tablet Take 20 mg 2 times daily.   vitamin B-12  Take 5,000 mcg daily.     Allergies  Allergen Reactions   Ace Inhibitors Cough    Per Dr Melvyn Novas pulmonology 2013   Aspartame Diarrhea and Nausea And Vomiting   Atorvastatin Other (See Comments)    Muscle aches   Biaxin [Clarithromycin] Other (See Comments)    Unknown reaction : PATIENT CAN TOLERATE Z-PAK.  Is written on patient's paper chart.   Daliresp [Roflumilast] Nausea And Vomiting   Erythromycin Other (See Comments)    Unknown reaction: PATIENT CAN TOLERATE Z-PAK.  It is written on patient's paper chart.   Levofloxacin Nausea And Vomiting     Problem list She has Essential  hypertension; Mild intermittent asthma without complication; ESOPHAGEAL STRICTURE; Incarcerated recurrent paraesophageal hiatal hernia s/p lap redo repair [id number]; MGUS (monoclonal gammopathy of unknown significance); Vitamin D deficiency; Osteoporosis; Coronary artery disease involving native heart without angina pectoris; Depression, major, in partial remission ([place]); Chronic pain syndrome; Hyperlipidemia, mixed; Gastroesophageal reflux disease; DDD (degenerative disc disease), lumbar; Spinal stenosis, lumbar region, with neurogenic claudication; Odynophagia; Atherosclerosis of aorta ([place]) by Lumbar CYT scan 01/29/2021; Chronic bronchitis (HCC); CKD (chronic kidney disease) stage 2, GFR 60-89 ml/min; Abnormal glucose (prediabetes); FHx: heart disease; Senile purpura ([place]); History of fundoplication; History of adenomatous polyp of colon; and Iron  deficiency on their problem list.   Observations/Objective:  BP 120/68   Pulse 70   Temp 97.9 F (36.6 C)   Resp 17   Ht '5\' 3"'$  (1.6 m)   Wt 143 lb 6.4 oz (65 kg)   SpO2 96%   BMI 25.40 kg/m    Walk test found resting O2 sat = 96% & P 64/min      &       after brisk 50' walk  O2 sat=94%    P 66/min  HEENT - WNL except tender maxillary areas. Neck - supple.  Chest - Few scattered Inspir. rale and forced post-tussive  expiratory coarse wheezes & Rhonchi.  Cor - Nl HS. RRR w/o sig MGR. PP 1(+). No edema. MS- FROM w/o deformities.  Gait Nl. Neuro -  Nl w/o focal abnormalities.   Assessment and Plan:   1. Essential hypertension  - CBC with Differential/Platelet - COMPLETE METABOLIC PANEL WITH GFR - Magnesium - TSH   2. Moderate persistent asthmatic bronchitis with acute exacerbation  - azithromycin  250 MG tablet;  Take 2 tablets with Food on  Day 1, then 1 tablet Daily \   Dispense: 6 each; Refill: 1   3. Subacute maxillary sinusitis  - azithromycin  250 MG tablet;  Take 2 tablets with Food on  Day 1, then 1 tablet Daily Dispense: 6 each; Refill: 1 - CBC with Differential/Platelet   4. Chronic bronchitis, ([place])  - azithromycin 250 MG tablet;  Take 2 tablets with Food on  Day 1, then 1 tablet Daily Dispense: 6 each; Refill: 1 - CBC with Differential/Platelet   5. Abnormal glucose  - COMPLETE METABOLIC PANEL WITH GFR - Hemoglobin A1c   6. Gastroesophageal reflux disease  - CBC with Differential/Platelet   7. Fatigue  - CBC with Differential/Platelet - COMPLETE METABOLIC PANEL WITH GFR - TSH - Hemoglobin A1c   8. Medication management  - CBC with Differential/Platelet - COMPLETE METABOLIC PANEL WITH GFR - Magnesium - TSH - Hemoglobin A1c    Follow Up Instructions:        I discussed the assessment and treatment plan with the patient. The patient was provided an opportunity to ask questions and all were answered. The patient agreed  with the plan and demonstrated an understanding of the instructions.       The patient was advised to call back or seek an in-person evaluation if the symptoms worsen or if the condition fails to improve as anticipated.    Kirtland Bouchard, MD

## 2022-05-24 LAB — CBC WITH DIFFERENTIAL/PLATELET
Absolute Monocytes: 142 cells/uL — ABNORMAL LOW (ref 200–950)
Basophils Absolute: 22 cells/uL (ref 0–200)
Basophils Relative: 0.2 %
Eosinophils Absolute: 0 cells/uL — ABNORMAL LOW (ref 15–500)
Eosinophils Relative: 0 %
HCT: 43.3 % (ref 35.0–45.0)
Hemoglobin: 14.9 g/dL (ref 11.7–15.5)
Lymphs Abs: 1003 cells/uL (ref 850–3900)
MCH: 32 pg (ref 27.0–33.0)
MCHC: 34.4 g/dL (ref 32.0–36.0)
MCV: 92.9 fL (ref 80.0–100.0)
MPV: 10.8 fL (ref 7.5–12.5)
Monocytes Relative: 1.3 %
Neutro Abs: 9734 cells/uL — ABNORMAL HIGH (ref 1500–7800)
Neutrophils Relative %: 89.3 %
Platelets: 338 10*3/uL (ref 140–400)
RBC: 4.66 10*6/uL (ref 3.80–5.10)
RDW: 12.9 % (ref 11.0–15.0)
Total Lymphocyte: 9.2 %
WBC: 10.9 10*3/uL — ABNORMAL HIGH (ref 3.8–10.8)

## 2022-05-24 LAB — COMPLETE METABOLIC PANEL WITH GFR
AG Ratio: 1.4 (calc) (ref 1.0–2.5)
ALT: 22 U/L (ref 6–29)
AST: 14 U/L (ref 10–35)
Albumin: 4 g/dL (ref 3.6–5.1)
Alkaline phosphatase (APISO): 73 U/L (ref 37–153)
BUN/Creatinine Ratio: 26 (calc) — ABNORMAL HIGH (ref 6–22)
BUN: 25 mg/dL (ref 7–25)
CO2: 23 mmol/L (ref 20–32)
Calcium: 10.1 mg/dL (ref 8.6–10.4)
Chloride: 102 mmol/L (ref 98–110)
Creat: 0.96 mg/dL — ABNORMAL HIGH (ref 0.60–0.95)
Globulin: 2.9 g/dL (calc) (ref 1.9–3.7)
Glucose, Bld: 117 mg/dL — ABNORMAL HIGH (ref 65–99)
Potassium: 4.8 mmol/L (ref 3.5–5.3)
Sodium: 136 mmol/L (ref 135–146)
Total Bilirubin: 0.5 mg/dL (ref 0.2–1.2)
Total Protein: 6.9 g/dL (ref 6.1–8.1)
eGFR: 58 mL/min/{1.73_m2} — ABNORMAL LOW (ref >=60)

## 2022-05-24 LAB — MAGNESIUM: Magnesium: 1.9 mg/dL (ref 1.5–2.5)

## 2022-05-24 LAB — TSH: TSH: 0.29 mIU/L — ABNORMAL LOW (ref 0.40–4.50)

## 2022-05-24 LAB — HEMOGLOBIN A1C
Hgb A1c MFr Bld: 6.2 % of total Hgb — ABNORMAL HIGH (ref <5.7)
Mean Plasma Glucose: 131 mg/dL
eAG (mmol/L): 7.3 mmol/L

## 2022-05-24 NOTE — Progress Notes (Signed)
<><><><><><><><><><><><><><><><><><><><><><><><><><><><><><><><><> <><><><><><><><><><><><><><><><><><><><><><><><><><><><><><><><><>  -   Thyroid borderline out of range  - will recheck at 07/22/23 office visit  - Kidney functions Stable & OK  <><><><><><><><><><><><><><><><><><><><><><><><><><><><><><><><><> <><><><><><><><><><><><><><><><><><><><><><><><><><><><><><><><><>  -  A1c = 6.2%  - Blood sugar and A1c are STILL elevated in the borderline and                                                               early or pre-diabetes range which has the same   300% increased risk for heart attack, stroke, cancer and                                                alzheimer- type vascular dementia as full blown diabetes.   But the good news is that diet, exercise with weight loss can                                                                                  cure the early diabetes at this point.  <><><><><><><><><><><><><><><><><><><><><><><><><><><><><><><><><> <><><><><><><><><><><><><><><><><><><><><><><><><><><><><><><><><>  - All else - OK   <><><><><><><><><><><><><><><><><><><><><><><><><><><><><><><><><> <><><><><><><><><><><><><><><><><><><><><><><><><><><><><><><><><>  -

## 2022-05-25 ENCOUNTER — Telehealth: Payer: Self-pay | Admitting: Internal Medicine

## 2022-05-25 NOTE — Telephone Encounter (Signed)
Try dulera 200 with advair 115 last choice  rx is same Take 2 puffs first thing in am and then another 2 puffs about 12 hours later.

## 2022-05-26 MED ORDER — MOMETASONE FURO-FORMOTEROL FUM 200-5 MCG/ACT IN AERO: 2.0000 | INHALATION_SPRAY | Freq: Two times a day (BID) | RESPIRATORY_TRACT | 11 refills | Status: DC

## 2022-05-26 NOTE — Telephone Encounter (Signed)
Called and spoke with pt letting her know recs per Dr. Melvyn Novas and she verbalized understanding. Rx for [facility] sent to pharmacy for pt. Nothing further needed.

## 2022-06-01 ENCOUNTER — Telehealth: Payer: Self-pay

## 2022-06-01 ENCOUNTER — Other Ambulatory Visit (HOSPITAL_COMMUNITY): Payer: Self-pay

## 2022-06-01 NOTE — Telephone Encounter (Signed)
PA request received via CMM for Dulera 200-5MCG/ACT aerosol  PA has been submitted to [place] Team and has been APPROVED from01/24/2024-06/01/2023  Key: [id number]

## 2022-06-21 ENCOUNTER — Ambulatory Visit (INDEPENDENT_AMBULATORY_CARE_PROVIDER_SITE_OTHER): Payer: PPO | Admitting: Nurse Practitioner

## 2022-06-21 ENCOUNTER — Encounter: Payer: Self-pay | Admitting: Nurse Practitioner

## 2022-06-21 VITALS — BP 138/68 | HR 73 | Temp 97.5°F | Ht 63.0 in | Wt 142.4 lb

## 2022-06-21 DIAGNOSIS — K219 Gastro-esophageal reflux disease without esophagitis: Secondary | ICD-10-CM

## 2022-06-21 DIAGNOSIS — I10 Essential (primary) hypertension: Secondary | ICD-10-CM

## 2022-06-21 DIAGNOSIS — R110 Nausea: Secondary | ICD-10-CM

## 2022-06-21 NOTE — Patient Instructions (Signed)
Nausea, Adult Nausea is the feeling of having an upset stomach or that you are about to vomit. Nausea on its own is not usually a serious concern, but it may be an early sign of a more serious medical problem. As nausea gets worse, it can lead to vomiting. If vomiting develops, or if you are not able to drink enough fluids, you are at risk of becoming dehydrated. Dehydration can make you tired and thirsty, cause you to have a dry mouth, and decrease how often you urinate. Older adults and people with other diseases or a weak disease-fighting system (immune system) are at higher risk for dehydration. The main goals of treating your nausea are: To relieve your nausea. To limit repeated nausea episodes. To prevent vomiting and dehydration. Follow these instructions at home: Watch your symptoms for any changes. Tell your health care provider about them. Eating and drinking     Take an oral rehydration solution (ORS). This is a drink that is sold at pharmacies and retail stores. Drink clear fluids slowly and in small amounts as you are able. Clear fluids include water, ice chips, low-calorie sports drinks, and fruit juice that has water added (diluted fruit juice). Eat bland, easy-to-digest foods in small amounts as you are able. These foods include bananas, applesauce, rice, lean meats, toast, and crackers. Avoid drinking fluids that contain a lot of sugar or caffeine, such as energy drinks, sports drinks, and soda. Avoid alcohol. Avoid spicy or fatty foods. General instructions Take over-the-counter and prescription medicines only as told by your health care provider. Rest at home while you recover. Drink enough fluid to keep your urine pale yellow. Breathe slowly and deeply when you feel nauseous. Avoid smelling things that have strong odors. Wash your hands often using soap and water for at least 20 seconds. If soap and water are not available, use hand sanitizer. Make sure that everyone in  your household washes their hands well and often. Keep all follow-up visits. This is important. Contact a health care provider if: Your nausea gets worse. Your nausea does not go away after two days. You vomit multiple times. You cannot drink fluids without vomiting. You have any of the following: New symptoms. A fever. A headache. Muscle cramps. A rash. Pain while urinating. You feel light-headed or dizzy. Get help right away if: You have pain in your chest, neck, arm, or jaw. You feel extremely weak or you faint. You have vomit that is bright red or looks like coffee grounds. You have bloody or black stools (feces) or stools that look like tar. You have a severe headache, a stiff neck, or both. You have severe pain, cramping, or bloating in your abdomen. You have difficulty breathing or are breathing very quickly. Your heart is beating very quickly. Your skin feels cold and clammy. You feel confused. You have signs of dehydration, such as: Dark urine, very little urine, or no urine. Cracked lips. Dry mouth. Sunken eyes. Sleepiness. Weakness. These symptoms may be an emergency. Get help right away. Call 911. Do not wait to see if the symptoms will go away. Do not drive yourself to the hospital. Summary Nausea is the feeling that you have an upset stomach or that you are about to vomit. Nausea on its own is not usually a serious concern, but it may be an early sign of a more serious medical problem. If vomiting develops, or if you are not able to drink enough fluids, you are at risk of becoming  dehydrated. Follow recommendations for eating and drinking and take over-the-counter and prescription medicines only as told by your health care provider. Contact a health care provider right away if your symptoms worsen or you have new symptoms. Keep all follow-up visits. This is important. This information is not intended to replace advice given to you by your health care provider.  Make sure you discuss any questions you have with your health care provider. Document Revised: 10/30/2020 Document Reviewed: 10/30/2020 Elsevier Patient Education  [place].

## 2022-06-21 NOTE — Progress Notes (Signed)
Assessment and Plan:  [name] [name] was seen today for nausea.  Diagnoses and all orders for this visit:  Gastroesophageal reflux disease, unspecified whether esophagitis present Continue Omeprazole  Continue diet modifications and follow up with GI  Nausea Continue Emetrol as needed for mild nausea and use Phenergan if becomes severe Use BRAT diet Call and schedule appointment with GI for further evaluation If worsens and becomes associated with vomiting and is able to keep foods/liquids down or becomes associated with pain go to the er  Essential hypertension - continue medications, DASH diet, exercise and monitor at home. Call if greater than 130/80.        Further disposition pending results of labs. Discussed med's effects and SE's.   Over 30 minutes of exam, counseling, chart review, and critical decision making was performed.   Future Appointments  Date Time Provider [place]  07/12/2022 10:30 AM [name], MD [facility] None  07/22/2022  9:30 AM Darrol Jump, NP [facility] None  10/31/2022 10:00 AM Alycia Rossetti, NP [facility] None    ------------------------------------------------------------------------------------------------------------------   HPI BP 138/68   Pulse 73   Temp (!) 97.5 F (36.4 C)   Ht 5' 3"$  (1.6 m)   Wt 142 lb 6.4 oz (64.6 kg)   SpO2 95%   BMI 25.23 kg/m    87 y.o.female presents for nausea x 2 days. She has been making herself eat but does not make the symptoms worse. The phenergan 25 mg does help but she does save those, Emetrol over the counter does help some. States that Zofran does not help nausea at all.   BP is well controlled with Metoprolol 50 mg 1/2 tab BID, Amlodipine 10 mg QD.  BP Readings from Last 3 Encounters:  06/21/22 138/68  05/23/22 120/68  05/16/22 124/72   BMI is Body mass index is 25.23 kg/m., she has not been working on diet and exercise. Wt Readings from Last 3 Encounters:  06/21/22 142 lb  6.4 oz (64.6 kg)  05/23/22 143 lb 6.4 oz (65 kg)  05/16/22 142 lb 3.2 oz (64.5 kg)     Past Medical History:  Diagnosis Date   Anxiety    takes Xanax daily as needed   Asthma    Albuterol daily as needed   Blood transfusion without reported diagnosis    CAD (coronary artery disease)    LHC 2003 with 40% pLAD, 30% mLAD, 100% D1 (moderate vessel), 100%  D2 (small vessel), 95% mid small OM2.  Pt had PTCA to D1;  D2 and OM2 small vessels and tx medically;  Last Myoview (2012): anterior infarct seen with scar, no ischemia. EF normal. Med Rx // Myoview (12/14):  Low risk; ant scar w/ peri-infarct ischemia; EF 55% with ant AK // Myoview 5/16: EF 55-65, ant, apical scar, no ischemia    Cataract    Chronic back pain    HNP, DDD, spinal stenosis, vertebral compression fractures.    Chronic nausea    takes Zofran daily as needed.  normal gastric emptying study 03/2013   CKD (chronic kidney disease), stage III ([place]) 09/27/2017   Constipation    felt to be functional. takes Senokot and Miralax daily.  treated with Linzess in past.    DIVERTICULOSIS, COLON 08/28/2008   Qualifier: Diagnosis of  By: [name], [name], [name]     Elevated LFTs 2015   probably med induced DILI, from Augmentin vs Pravastatin   GERD (gastroesophageal reflux disease)    hx of esophageal stricture  Hiatal hernia    Paraesophageal hernias. s/p fundoplications in 0000000 and 04/2013   History of bronchitis several of yrs ago   History of colon polyps 03/2009, 03/2011   adenomatous colon polyps, no high grade dysplasia.    History of vertigo    no meds   Hyperlipidemia    takes Pravastatin daily   Hypertension    takes Amlodipine,Metoprolol,and Losartan daily   Multiple closed pelvic fractures without disruption of pelvic circle (HCC)    Nausea 03/2016   Osteoporosis    PONV (postoperative nausea and vomiting)    yrs ago   Pubic ramus fracture ([place]) 12/20/2019   Slow urinary stream    takes Rapaflo daily   TIA  (transient ischemic attack) 1995   no residual effects noted   Vertebral compression fracture (HCC) 06/23/2012     Allergies  Allergen Reactions   Ace Inhibitors Cough    Per Dr Melvyn Novas pulmonology 2013   Aspartame Diarrhea and Nausea And Vomiting   Atorvastatin Other (See Comments)    Muscle aches   Biaxin [Clarithromycin] Other (See Comments)    Unknown reaction : PATIENT CAN TOLERATE Z-PAK.  Is written on patient's paper chart.   Daliresp [Roflumilast] Nausea And Vomiting   Erythromycin Other (See Comments)    Unknown reaction: PATIENT CAN TOLERATE Z-PAK.  It is written on patient's paper chart.   Levofloxacin Nausea And Vomiting    Current Outpatient Medications on File Prior to Visit  Medication Sig   albuterol (PROVENTIL) (2.5 MG/3ML) 0.083% nebulizer solution Take 3 mLs (2.5 mg total) by nebulization every 4 (four) hours as needed for wheezing or shortness of breath.   albuterol (VENTOLIN HFA) 108 (90 Base) MCG/ACT inhaler INHALE 2 PUFFS BY MOUTH EVERY 4 HOURS AS NEEDED FOR WHEEZING FOR SHORTNESS OF BREATH   ALPRAZolam (XANAX) 0.5 MG tablet Take 1/2 tablet 1- 2 x /day ONLY if needed for Anxiety or Panic Attack &  limit to 5 days /week to avoid Addiction - DO NOT take with Hydrocodone or Codeine   amLODipine (NORVASC) 10 MG tablet Take   1 tablet  Daily for BP & Heart                                                          /                              TAKE                              BY                             MOUTH                         ONCE DAILY   anti-nausea (EMETROL) solution Take 10 mLs by mouth every 15 (fifteen) minutes as needed for nausea or vomiting.   aspirin EC 81 MG tablet Take 81 mg by mouth at bedtime. Every other night   azithromycin (ZITHROMAX) 250 MG tablet Take 2 tablets with Food on  Day 1, then 1 tablet Daily with Food  for Sinusitis / Bronchitis   Cholecalciferol (VITAMIN D3) 5000 units CAPS Take 5,000 Units by mouth 2 (two) times daily.    DULoxetine  (CYMBALTA) 30 MG capsule TAKE 1 CAPSULE BY MOUTH ONCE DAILY, FOR MOOD AND CHRONIC PAIN   gabapentin (NEURONTIN) 400 MG capsule Take 400 mg by mouth 4 (four) times daily.   ketoconazole (NIZORAL) 2 % cream Apply to  Ringworm rash  2 x /day   Magnesium 400 MG TABS Take 400 mg by mouth daily at 2 PM.   melatonin 5 MG TABS Take 5 mg by mouth at bedtime as needed.   metoprolol tartrate (LOPRESSOR) 50 MG tablet Take 1 tablet  2 x /day(every 12 hours)  for BP & Heart                                /                          TAKE                      BY                   MOUTH   Multiple Vitamins-Minerals (MULTIVITAMIN WITH MINERALS) tablet Take 1 tablet by mouth daily.   nitroGLYCERIN (NITROSTAT) 0.4 MG SL tablet Place 1 tablet (0.4 mg total) under the tongue every 5 (five) minutes as needed for chest pain.   omeprazole (PRILOSEC) 40 MG capsule Take 1 capsule (40 mg total) by mouth daily. Take  1 capsule 2 x /day to Prevent Indigestion & Heartburn   promethazine (PHENERGAN) 25 MG tablet TAKE 1 TABLET BY MOUTH 4 TIMES DAILY IF  NEEDED  FOR  NAUSEA  AND  VOMITING.   senna-docusate (SENOKOT-S) 8.6-50 MG tablet Take 2 tablets by mouth 2 (two) times daily.   trospium (SANCTURA) 20 MG tablet Take 20 mg by mouth 2 (two) times daily.   vitamin B-12 (CYANOCOBALAMIN) 1000 MCG tablet Take 5,000 mcg by mouth daily.   mometasone-formoterol (DULERA) 200-5 MCG/ACT AERO Inhale 2 puffs into the lungs in the morning and at bedtime. (Patient not taking: Reported on 06/21/2022)   predniSONE (DELTASONE) 10 MG tablet Take  4 each am x 2 days,   2 each am x 2 days,  1 each am x 2 days and stop   No current facility-administered medications on file prior to visit.    ROS: all negative except above.   Physical Exam:  BP 138/68   Pulse 73   Temp (!) 97.5 F (36.4 C)   Ht 5' 3"$  (1.6 m)   Wt 142 lb 6.4 oz (64.6 kg)   SpO2 95%   BMI 25.23 kg/m   General Appearance: Well nourished, in no apparent distress. Eyes: PERRLA,  EOMs, conjunctiva no swelling or erythema Sinuses: No Frontal/maxillary tenderness ENT/Mouth: Ext aud canals clear, TMs without erythema, bulging. No erythema, swelling, or exudate on post pharynx.  Tonsils not swollen or erythematous. Hearing normal.  Neck: Supple, thyroid normal.  Respiratory: Respiratory effort normal, BS equal bilaterally without rales, rhonchi, wheezing or stridor.  Cardio: RRR with no MRGs. Brisk peripheral pulses without edema.  Abdomen: Soft, + BS.  Very mild epigastric tenderness Lymphatics: Non tender without lymphadenopathy.  Musculoskeletal: Full ROM, 5/5 strength, normal gait.  Skin: Warm, dry without rashes, lesions, ecchymosis.  Neuro: Cranial nerves intact. Normal  muscle tone, no cerebellar symptoms. Sensation intact.  Psych: Awake and oriented X 3, normal affect, Insight and Judgment appropriate.     Alycia Rossetti, NP 11:53 AM [name]

## 2022-06-22 DIAGNOSIS — C4442 Squamous cell carcinoma of skin of scalp and neck: Secondary | ICD-10-CM | POA: Diagnosis not present

## 2022-06-22 DIAGNOSIS — D492 Neoplasm of unspecified behavior of bone, soft tissue, and skin: Secondary | ICD-10-CM | POA: Diagnosis not present

## 2022-06-27 ENCOUNTER — Ambulatory Visit: Payer: PPO | Admitting: Internal Medicine

## 2022-06-28 ENCOUNTER — Ambulatory Visit: Payer: PPO | Admitting: Internal Medicine

## 2022-07-06 ENCOUNTER — Other Ambulatory Visit: Payer: Self-pay | Admitting: Internal Medicine

## 2022-07-06 DIAGNOSIS — M48062 Spinal stenosis, lumbar region with neurogenic claudication: Secondary | ICD-10-CM

## 2022-07-06 DIAGNOSIS — M549 Dorsalgia, unspecified: Secondary | ICD-10-CM

## 2022-07-06 MED ORDER — GABAPENTIN 600 MG PO TABS: ORAL_TABLET | ORAL | 1 refills | Status: DC

## 2022-07-11 DIAGNOSIS — N3946 Mixed incontinence: Secondary | ICD-10-CM | POA: Diagnosis not present

## 2022-07-11 NOTE — Progress Notes (Signed)
Subjective:   Patient ID: [name] [name], female    DOB: 13-Sep-1935    MRN: [id number]   Brief patient profile:  76  yowf  never smoker with large hiatal hernia & recurrent asthmatic bronchitis , about twice/yr for many yrs. Lung parenchyma looks ok on Ct scans 10/2006 & 12/10. PFTs nml in 12/09 & mild airway obstruction in 08/2009.  First presented 3/09 . CT neck for thyroid imaging s/o mediastinal contour abnormal. Ct chest >> large hiatal hernia & BL upper lobe hazy , ground glass opacity.  Dec, 2010  [name])  Fe def anemia requiring transfusions, small bowel AVMs suspected.  Mar 2011 Acute visit- c/o cough and wheezing >> augmentin helped, enalapril changed to diovan     09/08/2017 1st  office visit/ [name]  Re AB  Chief Complaint  Patient presents with   Pulmonary Consult    Last seen here in July 2015. She states that she is here for just "check up". Breathing is overall doing well.  She rarely uses her albuterol inhaler.   uses dulera 200 when over does it  Sleeping s resp problems  No cough  Walks at [employer] at a slow pace with a cane and not really limited by doe   rec In the event of cough/ wheeze or worse short of breath > dulera  100 or 200 up to 2 pffs every 12 hours Work on inhaler technique:   Plan B = Backup Only use your albuterol as a rescue medication Plan C = Crisis - only use your albuterol nebulizer if you first try Plan B and it fails to help > ok to use the nebulizer up to every 4 hours but if start needing it regularly call for immediate appointment        03/22/2022  f/u ov/Amogh Komatsu re: asthma/gerd   maint on symbicort 160   Chief Complaint  Patient presents with   Follow-up    Cough is some better but has not resolved. She has prod cough with green sputum.    Dyspnea:  baseline  Cough: was much better p augmentin and pred then gradually worse again  Sleeping: on bed blocks s noct awakening but then cough present w/in a few min of stirring in am  SABA use:  rarely uses 02: none  Rec Plan A = Automatic = Always=    Symbicort 160  Take 2 puffs first thing in am and then another 2 puffs about 12 hours later.  Plan B = Backup (to supplement plan A, not to replace it) Only use your albuterol inhaler as a rescue medication  Plan C = Crisis (instead of Plan B but only if Plan B stops working) - only use your albuterol nebulizer if you first try Plan B  Prednisone 10 mg take  4 each am x 2 days,   2 each am x 2 days,  1 each am x 2 days and stop  Augmentin 875 mg take one pill twice daily  X 14 days  Please schedule a follow up office visit in 6 weeks, call sooner if needed bring inhaler/spacer /solutions with you including over the counter medications other than vitamins     05/16/2022  f/u ov/[name] re: AB/gerd flare since fall 2023  maint on Symbicort 160/ ppi bid   Chief Complaint  Patient presents with   Follow-up    Pt fell 03/20/2022.  Pt feels like she bruised rib area. No fracture.  Rib area painful with deep breaths.  Dyspnea:  [employer] walking fine  Cough: no am flare/worse if overdoes it  / mucoid  Sleeping: sleeping on bed block/ SABA use: not much  02: none  Covid status:   vax max New L cp p fell in November 2023 but gradually improving/ worse with cough  Rec Prednisone 10 mg take  4 each am x 2 days,   2 each am x 2 days,  1 each am x 2 days and stop  For cough / congestion > stop robitussin and start mucinex dm 1200 mg every 12 hours as needed Plan A = Automatic = Always=   Symbicort 160 Take 2 puffs first thing in am and then another 2 puffs about 12 hours later.  Work on inhaler technique:  Plan B = Backup (to supplement plan A, not to replace it) Only use your albuterol inhaler as a rescue medication Plan C = Crisis (instead of Plan B but only if Plan B stops working) - only use your albuterol nebulizer if you first try Plan B Please schedule a follow up office visit in 6 weeks, call sooner if needed - bring your symbicort and  spacer     07/12/2022  f/u ov/[name] re: AB/gerd   maint on ppi bid / no ICS (can't afford )   Chief Complaint  Patient presents with   Follow-up    Breathing has been worse over the past wk- started after cleaning a dusty room, she was unable to get the [facility] due to cost, using albuterol inhaler 3-4 x per day. She has had prod cough with green/yellow, thick sputum and has noticed some wheezing.   Dyspnea:  [employer] walking still  Cough: worst 1st thing in am turing thicker and slt green  Sleeping: on bed blocks  SABA use: rarely  02: none      No obvious day to day or daytime variability or assoc mucus plugs or hemoptysis or cp or chest tightness, subjective wheeze or overt sinus or hb symptoms.     Also denies any obvious fluctuation of symptoms with weather or environmental changes or other aggravating or alleviating factors except as outlined above   No unusual exposure hx or h/o childhood pna/ asthma or knowledge of premature birth.  Current Allergies, Complete Past Medical History, Past Surgical History, Family History, and Social History were reviewed in [employer] record.  ROS  The following are not active complaints unless bolded Hoarseness, sore throat, dysphagia, dental problems, itching, sneezing,  nasal congestion or discharge of excess mucus or purulent secretions, ear ache,   fever, chills, sweats, unintended wt loss or wt gain, classically pleuritic or exertional cp,  orthopnea pnd or arm/hand swelling  or leg swelling, presyncope, palpitations, abdominal pain, anorexia, nausea, vomiting, diarrhea  or change in bowel habits or change in bladder habits, change in stools or change in urine, dysuria, hematuria,  rash, arthralgias, visual complaints, headache, numbness, weakness or ataxia or problems with walking or coordination,  change in mood or  memory.        Current Meds  Medication Sig   albuterol (PROVENTIL) (2.5 MG/3ML) 0.083% nebulizer solution  Take 3 mLs (2.5 mg total) by nebulization every 4 (four) hours as needed for wheezing or shortness of breath.   albuterol (VENTOLIN HFA) 108 (90 Base) MCG/ACT inhaler INHALE 2 PUFFS BY MOUTH EVERY 4 HOURS AS NEEDED FOR WHEEZING FOR SHORTNESS OF BREATH   ALPRAZolam (XANAX) 0.5 MG tablet Take 1/2 tablet 1- 2 x /day  ONLY if needed for Anxiety or Panic Attack &  limit to 5 days /week to avoid Addiction - DO NOT take with Hydrocodone or Codeine   amLODipine (NORVASC) 10 MG tablet Take   1 tablet  Daily for BP & Heart                                                          /                              TAKE                              BY                             MOUTH                         ONCE DAILY   anti-nausea (EMETROL) solution Take 10 mLs by mouth every 15 (fifteen) minutes as needed for nausea or vomiting.   aspirin EC 81 MG tablet Take 81 mg by mouth at bedtime. Every other night   Cholecalciferol (VITAMIN D3) 5000 units CAPS Take 5,000 Units by mouth 2 (two) times daily.    gabapentin (NEURONTIN) 600 MG tablet Take  1/2 to 1 tablet  3 to 4 x /Daily  as needed for Pain   ketoconazole (NIZORAL) 2 % cream Apply to  Ringworm rash  2 x /day   Magnesium 400 MG TABS Take 400 mg by mouth daily at 2 PM.   melatonin 5 MG TABS Take 5 mg by mouth at bedtime as needed.   metoprolol tartrate (LOPRESSOR) 50 MG tablet Take 1 tablet  2 x /day(every 12 hours)  for BP & Heart                                /                          TAKE                      BY                   MOUTH   Multiple Vitamins-Minerals (MULTIVITAMIN WITH MINERALS) tablet Take 1 tablet by mouth daily.   nitroGLYCERIN (NITROSTAT) 0.4 MG SL tablet Place 1 tablet (0.4 mg total) under the tongue every 5 (five) minutes as needed for chest pain.   omeprazole (PRILOSEC) 40 MG capsule Take 1 capsule (40 mg total) by mouth daily. Take  1 capsule 2 x /day to Prevent Indigestion & Heartburn   promethazine (PHENERGAN) 25 MG tablet TAKE 1 TABLET  BY MOUTH 4 TIMES DAILY IF  NEEDED  FOR  NAUSEA  AND  VOMITING.   senna-docusate (SENOKOT-S) 8.6-50 MG tablet Take 2 tablets by mouth 2 (two) times daily.   trospium (SANCTURA) 20 MG tablet Take 20 mg by mouth 2 (two) times daily.   vitamin B-12 (CYANOCOBALAMIN) 1000 MCG tablet Take 5,000  mcg by mouth daily.                    Objective:   Physical Exam  Wts    07/12/2022          143  05/16/2022          142  03/22/2022       144   02/25/2022      147  01/28/2022        148 12/14/2021          144   05/12/2021          143  10/14/2020          150 09/02/2020        152   09/08/2017         164   11/19/13 132 lb (59.875 kg)  11/14/13 127 lb (57.607 kg)  11/05/13 125 lb (56.7 kg)     Vital signs reviewed  07/12/2022  - Note at rest 02 sats  95% on RA   General appearance:    pleasat amb wf nad at rest     HEENT : Oropharynx  clear     NECK :  without  apparent JVD/ palpable Nodes/TM    LUNGS: no acc muscle use,  Min barrel  contour chest wall with bilateral  exp rhonchi \ CV:  RRR  no s3 or murmur or increase in P2, and no edema   ABD:  soft and nontender   MS:  Nl gait/ ext warm without deformities Or obvious joint restrictions  calf tenderness, cyanosis or clubbing     SKIN: warm and dry without lesions    NEURO:  alert, approp, nl sensorium with  no motor or cerebellar deficits apparent.           Assessment & Plan:

## 2022-07-12 ENCOUNTER — Ambulatory Visit: Payer: PPO | Admitting: Internal Medicine

## 2022-07-12 ENCOUNTER — Encounter: Payer: Self-pay | Admitting: Internal Medicine

## 2022-07-12 VITALS — BP 114/60 | HR 88 | Temp 98.0°F | Ht 63.0 in | Wt 143.0 lb

## 2022-07-12 DIAGNOSIS — J4520 Mild intermittent asthma, uncomplicated: Secondary | ICD-10-CM

## 2022-07-12 MED ORDER — BUDESONIDE 0.25 MG/2ML IN SUSP: 0.2500 mg | Freq: Four times a day (QID) | RESPIRATORY_TRACT | 12 refills | Status: DC

## 2022-07-12 MED ORDER — PREDNISONE 10 MG PO TABS: ORAL_TABLET | ORAL | 0 refills | Status: DC

## 2022-07-12 NOTE — Patient Instructions (Addendum)
Prednisone 10 mg take  4 each am x 2 days,   2 each am x 2 days,  1 each am x 2 days and stop    Mucinex dm 1200 mg every 12 hours as needed  Plan A = Automatic = Always=   albuterol nebulizer with budesonide 0.25 twice daily and if getting worse increase to 4 daily    Plan B = Backup (to supplement plan A, not to replace it) Only use your albuterol inhaler as a rescue medication to be used if you can't catch your breath by resting or doing a relaxed purse lip breathing pattern.  - The less you use it, the better it will work when you need it. - Ok to use the inhaler up to 2 puffs  every 4 hours if you must but call for appointment if use goes up over your usual need - Don't leave home without it !!  (think of it like the spare tire for your car)      Please schedule a follow up office visit in 6 weeks, call sooner if needed

## 2022-07-13 ENCOUNTER — Encounter: Payer: Self-pay | Admitting: Internal Medicine

## 2022-07-13 NOTE — Assessment & Plan Note (Addendum)
09/08/2017  After extensive coaching inhaler device  effectiveness =    75% > continue dulera 100 2 bid prn  - Spirometry 09/08/2017  wnl with min curvature off rx  -  PFT's  09/02/2020  FEV1 1.38 (73 % ) ratio 0.77  p 11 % improvement from saba p advair 250  prior to study with DLCO  19.41 (103%) corrects to 5.38 (131%)  for alv volume and FV curve minimally concave  - 10/14/2020  try symbicort 80 2 bid   - 02/25/2022  increase symbicort to 160 2bid for freq flare on symb 80 though ? Compliance?  - 05/16/2022  After extensive coaching inhaler device,  effectiveness =    75% with spacer form a baseline of 25%   Mild flare off ics which she says she cannot afford  Rx Prednisone 10 mg take  4 each am x 2 days,   2 each am x 2 days,  1 each am x 2 days and stop  Mucinex dm prn and due  to cost concerns changed to budesonide 0.25 mg bid /albuterol 2.5 mg bid to increase qid for flares (very similar to using [employer] but much cheaper with more reliable delivery to target tissue in a geriatric pt / reviewed  F/u in 6 weeks, sooner if needed   Each maintenance medication was reviewed in detail including emphasizing most importantly the difference between maintenance and prns and under what circumstances the prns are to be triggered using an action plan format where appropriate.  Total time for H and P, chart review, counseling, reviewing neb  device(s) and generating customized AVS unique to this office visit / same day charting = 24 min

## 2022-07-22 ENCOUNTER — Ambulatory Visit (INDEPENDENT_AMBULATORY_CARE_PROVIDER_SITE_OTHER): Payer: PPO | Admitting: Nurse Practitioner

## 2022-07-22 ENCOUNTER — Encounter: Payer: Self-pay | Admitting: Nurse Practitioner

## 2022-07-22 VITALS — BP 112/62 | HR 72 | Temp 97.3°F | Ht 63.0 in | Wt 142.0 lb

## 2022-07-22 DIAGNOSIS — R350 Frequency of micturition: Secondary | ICD-10-CM | POA: Diagnosis not present

## 2022-07-22 DIAGNOSIS — K219 Gastro-esophageal reflux disease without esophagitis: Secondary | ICD-10-CM

## 2022-07-22 DIAGNOSIS — K440 Diaphragmatic hernia with obstruction, without gangrene: Secondary | ICD-10-CM

## 2022-07-22 DIAGNOSIS — I10 Essential (primary) hypertension: Secondary | ICD-10-CM | POA: Diagnosis not present

## 2022-07-22 DIAGNOSIS — Z79899 Other long term (current) drug therapy: Secondary | ICD-10-CM

## 2022-07-22 DIAGNOSIS — Z981 Arthrodesis status: Secondary | ICD-10-CM

## 2022-07-22 DIAGNOSIS — R7309 Other abnormal glucose: Secondary | ICD-10-CM | POA: Diagnosis not present

## 2022-07-22 DIAGNOSIS — J42 Unspecified chronic bronchitis: Secondary | ICD-10-CM | POA: Diagnosis not present

## 2022-07-22 DIAGNOSIS — J4541 Moderate persistent asthma with (acute) exacerbation: Secondary | ICD-10-CM

## 2022-07-22 DIAGNOSIS — M5136 Other intervertebral disc degeneration, lumbar region: Secondary | ICD-10-CM

## 2022-07-22 DIAGNOSIS — M48062 Spinal stenosis, lumbar region with neurogenic claudication: Secondary | ICD-10-CM | POA: Diagnosis not present

## 2022-07-22 DIAGNOSIS — D472 Monoclonal gammopathy: Secondary | ICD-10-CM

## 2022-07-22 DIAGNOSIS — R6889 Other general symptoms and signs: Secondary | ICD-10-CM

## 2022-07-22 DIAGNOSIS — Z0001 Encounter for general adult medical examination with abnormal findings: Secondary | ICD-10-CM

## 2022-07-22 DIAGNOSIS — J4520 Mild intermittent asthma, uncomplicated: Secondary | ICD-10-CM

## 2022-07-22 DIAGNOSIS — I2510 Atherosclerotic heart disease of native coronary artery without angina pectoris: Secondary | ICD-10-CM | POA: Diagnosis not present

## 2022-07-22 DIAGNOSIS — M51369 Other intervertebral disc degeneration, lumbar region without mention of lumbar back pain or lower extremity pain: Secondary | ICD-10-CM

## 2022-07-22 DIAGNOSIS — F3341 Major depressive disorder, recurrent, in partial remission: Secondary | ICD-10-CM | POA: Diagnosis not present

## 2022-07-22 DIAGNOSIS — R110 Nausea: Secondary | ICD-10-CM

## 2022-07-22 DIAGNOSIS — Z1231 Encounter for screening mammogram for malignant neoplasm of breast: Secondary | ICD-10-CM

## 2022-07-22 DIAGNOSIS — E559 Vitamin D deficiency, unspecified: Secondary | ICD-10-CM | POA: Diagnosis not present

## 2022-07-22 DIAGNOSIS — M810 Age-related osteoporosis without current pathological fracture: Secondary | ICD-10-CM

## 2022-07-22 DIAGNOSIS — I700 Atherosclerosis of aorta: Secondary | ICD-10-CM

## 2022-07-22 DIAGNOSIS — F419 Anxiety disorder, unspecified: Secondary | ICD-10-CM

## 2022-07-22 DIAGNOSIS — K222 Esophageal obstruction: Secondary | ICD-10-CM

## 2022-07-22 DIAGNOSIS — Z0000 Encounter for general adult medical examination without abnormal findings: Secondary | ICD-10-CM

## 2022-07-22 DIAGNOSIS — D692 Other nonthrombocytopenic purpura: Secondary | ICD-10-CM

## 2022-07-22 DIAGNOSIS — K5901 Slow transit constipation: Secondary | ICD-10-CM

## 2022-07-22 DIAGNOSIS — G894 Chronic pain syndrome: Secondary | ICD-10-CM

## 2022-07-22 DIAGNOSIS — M4316 Spondylolisthesis, lumbar region: Secondary | ICD-10-CM

## 2022-07-22 NOTE — Progress Notes (Unsigned)
AWV AND FOLLOW UP  Assessment:   Annual Medicare Wellness Visit Due annually  Health maintenance reviewed  Schedule DEXA, phone number given   Atherosclerosis of aorta ([place]) Per numerous CTs Control blood pressure, cholesterol, glucose, increase exercise.   Essential hypertension - continue medications,  - DASH diet, exercise and monitor at home. Call if greater than 130/80.  - CBC with Differential/Platelet - CMP/GFR  Prediabetes Discussed general issues about diabetes pathophysiology and management., Educational material distributed., Suggested low cholesterol diet., Encouraged aerobic exercise., Discussed foot care., Reminded to get yearly retinal exam. - Hemoglobin A1c q85m   Mixed hyperlipidemia -continue medications, check lipids, decrease fatty foods, increase activity.  - Lipid panel - TSH  Coronary artery disease due to lipid rich plaque Control blood pressure, cholesterol, glucose, increase exercise. Statin for LDL goal <70  - Lipid panel  Recurrent major depressive disorder, in partial remission (HCC) Continue medications  Lifestyle discussed: diet/exerise, sleep hygiene, stress management, hydration  Incarcerated recurrent paraesophageal hiatal hernia s/p lap redo repair [id number] Improved, continue PPI   Asthma, unspecified asthma severity, uncomplicated Continue inhalers Go to the ER if any chest pain, shortness of breath, nausea, dizziness, severe HA, changes vision/speech  Chronic bronchitis (HCC) Continue inhalers, follows with pulm - may need allergy med, try adding mucinex - follow up pulm if not improving  Osteoporosis Continue prolia via Dr. Estanislado Pandy, continue Vit D and Ca, weight bearing exercises - schedule DEXA   Herniated lumbar intervertebral disc Continue pain management  Vitamin D deficiency - Vit D  25 hydroxy (rtn osteoporosis monitoring)  MGUS (monoclonal gammopathy of unknown significance) Continue to monitor; had negative  biopsy  - CMP/GFR  Medication management - Magnesium  Chronic pain syndrome Continue pain management   Anxiety continue medications, stress management techniques discussed, increase water, good sleep hygiene discussed, increase exercise, and increase veggies.  - TSH   Gastroesophageal reflux disease, esophagitis presence not specified Continue PPI/H2 blocker, diet discussed - CMP/GFR - Magnesium    ESOPHAGEAL STRICTURE Improved on last, follow up GI as recommended; continue PPI   Constipation, chronic Continue on senokot, GI following  Spondylolisthesis, lumbar region Followed by spine specialist and pain management.   DDD (degenerative disc disease), lumbar Continue follow up pain management  Spinal stenosis, lumbar region, with neurogenic claudication Continue follow up pain management  History of lumbar spinal fusion Continue follow up pain management  Senile purpura ([place]) Secondary to age and aspirin; reassured; protect skin    ADVISED TO REVIEW MED LIST AND CALL BACK WITH ANY NO LONGER TAKING  No orders of the defined types were placed in this encounter.   Over 30 minutes of exam, counseling, chart review, and critical decision making was performed  Future Appointments  Date Time Provider [place]  07/27/2022 11:00 AM Zehr, Tollie Eth [facility]  09/05/2022 11:15 AM [name], MD [facility] None  10/31/2022 10:00 AM Alycia Rossetti, NP [facility] None  06/02/2023 11:00 AM [name], MD [facility] None     Plan:   During the course of the visit the patient was educated and counseled about appropriate screening and preventive services including:   Pneumococcal vaccine  Influenza vaccine Td vaccine Prevnar 13 Screening electrocardiogram Screening mammography Bone densitometry screening Colorectal cancer screening Diabetes screening Glaucoma screening Nutrition counseling  Advanced directives: given  info/requested copies   Subjective:   [name] [name] is a 87 y.o. female who presents for AWV and follow up. She has Essential hypertension; Mild intermittent  asthma without complication; ESOPHAGEAL STRICTURE; Incarcerated recurrent paraesophageal hiatal hernia s/p lap redo repair [id number]; MGUS (monoclonal gammopathy of unknown significance); Vitamin D deficiency; Osteoporosis; Coronary artery disease involving native heart without angina pectoris; Depression, major, in partial remission ([place]); Chronic pain syndrome; Hyperlipidemia, mixed; Gastroesophageal reflux disease; DDD (degenerative disc disease), lumbar; Spinal stenosis, lumbar region, with neurogenic claudication; Odynophagia; Atherosclerosis of aorta ([place]) by Lumbar CYT scan 01/29/2021; Chronic bronchitis (HCC); CKD (chronic kidney disease) stage 2, GFR 60-89 ml/min; Abnormal glucose (prediabetes); FHx: heart disease; Senile purpura ([place]); History of fundoplication; History of adenomatous polyp of colon; and Iron deficiency on their problem list.   Patient has a chronic pain syndrome and is followed by Dr Nicholaus Bloom (pain management) and Dr Lorin Mercy.  Hx of numerous lumbar surgeries, disc and fusions historically, spinal stenosis, several thoracic compression fractures with kyphoplasty.    She is followed by Dr. Estanislado Pandy for prolia injections; hx/o Osteoporosis with T-2.6 at last DexaBMD on 11/2018 (improved from -2.8 in 2018). She expressed preference to avoid opioids and is prescribed gabapentin and cymbalta for pain and reports does well.   Follows closely with GI; hx of fundoplication; frequent nausea, had GI workup; she was advised to continue omeprazole BID, also carafate.   She followes with Dr. Melvyn Novas for COPD per imaging only, asthma, on breo and doing well.   She has history of MGUS with negative BX.   she has a diagnosis of anxiety./depression and is currently on cymbalta 30 mg daily, xanax 0.25-0.5 mg TID PRN, reports symptoms  are well controlled and hasn't needed in a very long time.   BMI is Body mass index is 25.15 kg/m., she has been working on diet and exercise, trying to work more in garden and trying to increase walking , will walk 60 min 2-3 days a week.  Wt Readings from Last 3 Encounters:  07/22/22 142 lb (64.4 kg)  07/12/22 143 lb (64.9 kg)  06/21/22 142 lb 6.4 oz (64.6 kg)   She also has history of PTCA/stent placed in 2003 and low risk myoview in 2014. She has aortic atherosclerosis per numerous CTs, e.g 04/2019. She follows with Dr. Marlou Porch.  Her blood pressure has been controlled at home, today their BP is BP: 112/62  She does not work out due to chronic pain. She denies CP but endorses some exertional dyspnea if rushes.   She is not on cholesterol medication and denies myalgias. Her cholesterol is not at goal of LDL <70. The cholesterol last visit was:   Lab Results  Component Value Date   CHOL 137 03/10/2022   HDL 52 03/10/2022   LDLCALC 55 03/10/2022   TRIG 256 (H) 03/10/2022   CHOLHDL 2.6 03/10/2022    She has been working on diet and exercise for prediabetes (x 2010), and denies polydipsia, polyuria and visual disturbances. Last A1C in the office was:  Lab Results  Component Value Date   HGBA1C 6.2 (H) 05/23/2022   Last GFR  Lab Results  Component Value Date   GFRNONAA 61 10/15/2020   Patient is on Vitamin D supplement and at goal at last check:  Lab Results  Component Value Date   VD25OH 103 (H) 03/10/2022     She is on a B12 supplement;  Lab Results  Component Value Date   VITAMINB12 772 10/15/2020    Medication Review Current Outpatient Medications on File Prior to Visit  Medication Sig Dispense Refill   albuterol (PROVENTIL) (2.5 MG/3ML) 0.083% nebulizer solution Take 3  mLs (2.5 mg total) by nebulization every 4 (four) hours as needed for wheezing or shortness of breath. 75 mL 2   albuterol (VENTOLIN HFA) 108 (90 Base) MCG/ACT inhaler INHALE 2 PUFFS BY MOUTH EVERY 4  HOURS AS NEEDED FOR WHEEZING FOR SHORTNESS OF BREATH 9 g 11   ALPRAZolam (XANAX) 0.5 MG tablet Take 1/2 tablet 1- 2 x /day ONLY if needed for Anxiety or Panic Attack &  limit to 5 days /week to avoid Addiction - DO NOT take with Hydrocodone or Codeine 10 tablet 0   amLODipine (NORVASC) 10 MG tablet Take   1 tablet  Daily for BP & Heart                                                          /                              TAKE                              BY                             MOUTH                         ONCE DAILY 90 tablet 3   anti-nausea (EMETROL) solution Take 10 mLs by mouth every 15 (fifteen) minutes as needed for nausea or vomiting.     aspirin EC 81 MG tablet Take 81 mg by mouth at bedtime. Every other night     budesonide (PULMICORT) 0.25 MG/2ML nebulizer solution Take 2 mLs (0.25 mg total) by nebulization 4 (four) times daily. 60 mL 12   Cholecalciferol (VITAMIN D3) 5000 units CAPS Take 5,000 Units by mouth 2 (two) times daily.      gabapentin (NEURONTIN) 600 MG tablet Take  1/2 to 1 tablet  3 to 4 x /Daily  as needed for Pain 360 tablet 1   ketoconazole (NIZORAL) 2 % cream Apply to  Ringworm rash  2 x /day 60 g 3   Magnesium 400 MG TABS Take 400 mg by mouth daily at 2 PM.     melatonin 5 MG TABS Take 5 mg by mouth at bedtime as needed.     metoprolol tartrate (LOPRESSOR) 50 MG tablet Take 1 tablet  2 x /day(every 12 hours)  for BP & Heart                                /                          TAKE                      BY                   MOUTH 180 tablet 3   Multiple Vitamins-Minerals (MULTIVITAMIN WITH MINERALS) tablet Take 1 tablet by mouth daily.     nitroGLYCERIN (NITROSTAT) 0.4 MG SL tablet  Place 1 tablet (0.4 mg total) under the tongue every 5 (five) minutes as needed for chest pain. 90 tablet 3   omeprazole (PRILOSEC) 40 MG capsule Take 1 capsule (40 mg total) by mouth daily. Take  1 capsule 2 x /day to Prevent Indigestion & Heartburn 90 capsule 0   promethazine  (PHENERGAN) 25 MG tablet TAKE 1 TABLET BY MOUTH 4 TIMES DAILY IF  NEEDED  FOR  NAUSEA  AND  VOMITING. 120 tablet 0   senna-docusate (SENOKOT-S) 8.6-50 MG tablet Take 2 tablets by mouth 2 (two) times daily. 60 tablet 1   trospium (SANCTURA) 20 MG tablet Take 20 mg by mouth 2 (two) times daily.     vitamin B-12 (CYANOCOBALAMIN) 1000 MCG tablet Take 5,000 mcg by mouth daily.     predniSONE (DELTASONE) 10 MG tablet Take  4 each am x 2 days,   2 each am x 2 days,  1 each am x 2 days and stop 14 tablet 0   No current facility-administered medications on file prior to visit.    Current Problems (verified) Patient Active Problem List   Diagnosis Date Noted   Iron deficiency 10/16/2020   History of adenomatous polyp of colon 07/21/2020   History of fundoplication    Senile purpura ([place]) 07/05/2019   Abnormal glucose (prediabetes) 03/01/2018   FHx: heart disease 03/01/2018   CKD (chronic kidney disease) stage 2, GFR 60-89 ml/min 09/27/2017   Chronic bronchitis ([place]) 08/09/2017   Atherosclerosis of aorta ([place]) by Lumbar CYT scan 01/29/2021 04/05/2017   Odynophagia    Spinal stenosis, lumbar region, with neurogenic claudication 05/29/2015   DDD (degenerative disc disease), lumbar 04/21/2015   Hyperlipidemia, mixed 11/24/2014   Gastroesophageal reflux disease 11/24/2014   Depression, major, in partial remission ([place]) 09/26/2014   Chronic pain syndrome 09/26/2014   Coronary artery disease involving native heart without angina pectoris    Osteoporosis 02/10/2014   Vitamin D deficiency 08/14/2013   MGUS (monoclonal gammopathy of unknown significance) 03/05/2013   ESOPHAGEAL STRICTURE 08/28/2008   Mild intermittent asthma without complication [id number]   Incarcerated recurrent paraesophageal hiatal hernia s/p lap redo repair [id number] 01/31/2008   Essential hypertension 07/25/2007    Screening Tests Immunization History  Administered Date(s) Administered   DT (Pediatric) 05/06/2014    Influenza Split 02/07/2011   Influenza Whole 02/09/2009, 01/19/2010, 02/13/2012   Influenza, High Dose Seasonal PF 03/22/2013, 01/15/2014, 02/27/2015, 01/14/2016, 01/18/2017, 02/14/2018, 02/14/2019, 03/16/2020, 02/05/2021, 03/10/2022   PFIZER(Purple Top)SARS-COV-2 Vaccination 05/25/2019, 06/15/2019, 03/24/2020   Pneumococcal Conjugate-13 05/06/2014   Pneumococcal Polysaccharide-23 02/09/2011   Pneumococcal-Unspecified 02/06/2010, 02/09/2012   Td 05/10/2003    Preventative care: Last colonoscopy: 05/2017, adenomatous polyp, Dr. Havery Moros, 3 year recall but contacted GI and was advised no further, DONE EGD 07/2019, candida esophagitis Last mammogram: 09/2020 Last pap smear/pelvic exam: remote,declines   DEXA 11/2018 L fem T -2.6, continue prolia per Dr. Estanislado Pandy, has number to schedule  Myocardial perfusion 09/2014, previous MI, low risk study Echo 08/2014, EF 55-60%   Shingles/Zostavax: declines due to cost  Names of Other Physician/Practitioners you currently use: 1. [facility]- here for primary care 2. Dr. Delman Cheadle, eye doctor, last visit 07/2021 3. No dentist, dentures.   4. Derm Dr. Pearline Cables, goes annually  Patient Care Team: [name], MD as PCP - General (Internal Medicine) Jerline Pain, MD as PCP - Cardiology (Cardiology) Inda Castle, MD (Inactive) as Consulting Physician (Gastroenterology) Rigoberto Noel, MD as Consulting Physician (Pulmonary Disease) Aundra Dubin,  Elby Showers, MD as Consulting Physician (Cardiology) Michael Boston, MD as Consulting Physician (General Surgery) Jessy Oto, MD (Inactive) as Consulting Physician (Orthopedic Surgery) Philemon Kingdom, MD as Consulting Physician (Endocrinology) Curt Bears, MD as Consulting Physician (Oncology) Rush Landmark, [facility] (Inactive) as Pharmacist (Pharmacist)  Allergies Allergies  Allergen Reactions   Ace Inhibitors Cough    Per Dr Melvyn Novas pulmonology 2013   Aspartame  Diarrhea and Nausea And Vomiting   Atorvastatin Other (See Comments)    Muscle aches   Biaxin [Clarithromycin] Other (See Comments)    Unknown reaction : PATIENT CAN TOLERATE Z-PAK.  Is written on patient's paper chart.   Daliresp [Roflumilast] Nausea And Vomiting   Erythromycin Other (See Comments)    Unknown reaction: PATIENT CAN TOLERATE Z-PAK.  It is written on patient's paper chart.   Levofloxacin Nausea And Vomiting    SURGICAL HISTORY She  has a past surgical history that includes Back surgery; Bladder repair; Hemorrhoid surgery; Cholecystectomy; Appendectomy; Total abdominal hysterectomy (1975); Eye surgery (11/2000); Hiatal hernia repair (06/03/2011); Laparoscopic Nissen fundoplication ([id number]); Lumbar laminectomy/decompression microdiscectomy (Right, 06/26/2012); Esophagogastroduodenoscopy (N/A, 03/15/2013); Esophageal manometry (N/A, 03/25/2013); Hiatal hernia repair (N/A, 04/24/2013); Insertion of mesh (N/A, 04/24/2013); Laparoscopic lysis of adhesions (N/A, 04/24/2013); Fixation kyphoplasty lumbar spine (X 2); Coronary angioplasty (11/16/2001); Lumbar laminectomy/decompression microdiscectomy (N/A, 05/29/2015); Lumbar fusion (12/07/2015); Esophagogastroduodenoscopy (N/A, 12/25/2015); IR KYPHO THORACIC WITH BONE BIOPSY (09/29/2017); IR KYPHO THORACIC WITH BONE BIOPSY (11/03/2017); Upper gastrointestinal endoscopy; IR KYPHO THORACIC WITH BONE BIOPSY (02/14/2019); and IR KYPHO THORACIC WITH BONE BIOPSY (04/02/2019). FAMILY HISTORY Her family history includes Arthritis in her brother; Brain cancer in her brother; Colon cancer (age of onset: 70) in her mother; Heart attack (age of onset: 38) in her father; Heart disease in her brother; Kidney disease in her daughter; Ulcers in her brother. SOCIAL HISTORY She  reports that she has never smoked. She has never used smokeless tobacco. She reports that she does not drink alcohol and does not use drugs.    Review of Systems  Constitutional:   Negative for malaise/fatigue and weight loss.  HENT:  Negative for hearing loss and tinnitus.   Eyes:  Negative for blurred vision and double vision.  Respiratory:  Positive for cough and sputum production (several months, saw pulm yesterday). Negative for shortness of breath and wheezing.   Cardiovascular:  Negative for chest pain, palpitations, orthopnea, claudication and leg swelling.  Gastrointestinal:  Positive for nausea (mild, intermittent in AM). Negative for abdominal pain, blood in stool, constipation, diarrhea, heartburn, melena and vomiting.  Genitourinary: Negative.  Negative for dysuria, flank pain, frequency (improved with medication), hematuria and urgency.  Musculoskeletal:  Positive for back pain. Negative for joint pain and myalgias.  Skin:  Negative for rash.  Neurological:  Negative for dizziness, tingling, sensory change, weakness and headaches.  Endo/Heme/Allergies:  Negative for polydipsia.  Psychiatric/Behavioral: Negative.    All other systems reviewed and are negative.    Objective:   Today's Vitals   07/22/22 0935  BP: 112/62  Pulse: 72  Temp: (!) 97.3 F (36.3 C)  SpO2: 99%  Weight: 142 lb (64.4 kg)  Height: 5\' 3"  (1.6 m)   Body mass index is 25.15 kg/m.  General appearance: alert, no distress, WD/WN,  female HEENT: normocephalic, sclerae anicteric, TMs pearly, nares patent, no discharge or erythema, pharynx normal Oral cavity: MMM, no lesions, dentures Neck: supple, no lymphadenopathy, no thyromegaly, no masses Heart: RRR, normal S1, S2, no murmurs Lungs: CTA bilaterally, no wheezes, rhonchi. She does have some  coarse bronchial sounds bilaterally and rales scattered. Abdomen: +bs, rounded,, soft, non tender, no palpable masses, no hepatomegaly, no splenomegaly Musculoskeletal: nontender, no swelling, no obvious deformity. Extremities: no edema, no cyanosis, no clubbing. She does have scattered small ecchymoses Pulses: 2+ symmetric, upper and lower  extremities, normal cap refill Neurological: alert, oriented x 3, CN2-12 intact, strength 4/5 upper extremities and lower extremities, DTRs 2+ throughout, no cerebellar signs, gait slow steady with cane Psychiatric: normal affect, behavior normal, pleasant    The patient's weight, height, BMI, and visual acuity have been recorded in the chart.  I have made referrals, counseling, and provided education to the patient based on review of the above and I have provided the patient with a written personalized care plan for preventive services.     Darrol Jump, NP   07/22/2022

## 2022-07-22 NOTE — Patient Instructions (Signed)
Healthy Eating, Adult Healthy eating may help you get and keep a healthy body weight, reduce the risk of chronic disease, and live a long and productive life. It is important to follow a healthy eating pattern. Your nutritional and calorie needs should be met mainly by different nutrient-rich foods. What are tips for following this plan? Reading food labels Read labels and choose the following: Reduced or low sodium products. Juices with 100% fruit juice. Foods with low saturated fats (<3 g per serving) and high polyunsaturated and monounsaturated fats. Foods with whole grains, such as whole wheat, cracked wheat, brown rice, and wild rice. Whole grains that are fortified with folic acid. This is recommended for females who are pregnant or who want to become pregnant. Read labels and do not eat or drink the following: Foods or drinks with added sugars. These include foods that contain brown sugar, corn sweetener, corn syrup, dextrose, fructose, glucose, high-fructose corn syrup, honey, invert sugar, lactose, malt syrup, maltose, molasses, raw sugar, sucrose, trehalose, or turbinado sugar. Limit your intake of added sugars to less than 10% of your total daily calories. Do not eat more than the following amounts of added sugar per day: 6 teaspoons (25 g) for females. 9 teaspoons (38 g) for males. Foods that contain processed or refined starches and grains. Refined grain products, such as white flour, degermed cornmeal, white bread, and white rice. Shopping Choose nutrient-rich snacks, such as vegetables, whole fruits, and nuts. Avoid high-calorie and high-sugar snacks, such as potato chips, fruit snacks, and candy. Use oil-based dressings and spreads on foods instead of solid fats such as butter, margarine, sour cream, or cream cheese. Limit pre-made sauces, mixes, and "instant" products such as flavored rice, instant noodles, and ready-made pasta. Try more plant-protein sources, such as tofu,  tempeh, black beans, edamame, lentils, nuts, and seeds. Explore eating plans such as the Mediterranean diet or vegetarian diet. Try heart-healthy dips made with beans and healthy fats like hummus and guacamole. Vegetables go great with these. Cooking Use oil to saut or stir-fry foods instead of solid fats such as butter, margarine, or lard. Try baking, boiling, grilling, or broiling instead of frying. Remove the fatty part of meats before cooking. Steam vegetables in water or broth. Meal planning  At meals, imagine dividing your plate into fourths: One-half of your plate is fruits and vegetables. One-fourth of your plate is whole grains. One-fourth of your plate is protein, especially lean meats, poultry, eggs, tofu, beans, or nuts. Include low-fat dairy as part of your daily diet. Lifestyle Choose healthy options in all settings, including home, work, school, restaurants, or stores. Prepare your food safely: Wash your hands after handling raw meats. Where you prepare food, keep surfaces clean by regularly washing with hot, soapy water. Keep raw meats separate from ready-to-eat foods, such as fruits and vegetables. Cook seafood, meat, poultry, and eggs to the recommended temperature. Get a food thermometer. Store foods at safe temperatures. In general: Keep cold foods at 40F (4.4C) or below. Keep hot foods at 140F (60C) or above. Keep your freezer at 0F (-17.8C) or below. Foods are not safe to eat if they have been between the temperatures of 40-140F (4.4-60C) for more than 2 hours. What foods should I eat? Fruits Aim to eat 1-2 cups of fresh, canned (in natural juice), or frozen fruits each day. One cup of fruit equals 1 small apple, 1 large banana, 8 large strawberries, 1 cup (237 g) canned fruit,  cup (82 g) dried fruit,   or 1 cup (240 mL) 100% juice. Vegetables Aim to eat 2-4 cups of fresh and frozen vegetables each day, including different varieties and colors. One cup  of vegetables equals 1 cup (91 g) broccoli or cauliflower florets, 2 medium carrots, 2 cups (150 g) raw, leafy greens, 1 large tomato, 1 large bell pepper, 1 large sweet potato, or 1 medium white potato. Grains Aim to eat 5-10 ounce-equivalents of whole grains each day. Examples of 1 ounce-equivalent of grains include 1 slice of bread, 1 cup (40 g) ready-to-eat cereal, 3 cups (24 g) popcorn, or  cup (93 g) cooked rice. Meats and other proteins Try to eat 5-7 ounce-equivalents of protein each day. Examples of 1 ounce-equivalent of protein include 1 egg,  oz nuts (12 almonds, 24 pistachios, or 7 walnut halves), 1/4 cup (90 g) cooked beans, 6 tablespoons (90 g) hummus or 1 tablespoon (16 g) peanut butter. A cut of meat or fish that is the size of a deck of cards is about 3-4 ounce-equivalents (85 g). Of the protein you eat each week, try to have at least 8 sounce (227 g) of seafood. This is about 2 servings per week. This includes salmon, trout, herring, sardines, and anchovies. Dairy Aim to eat 3 cup-equivalents of fat-free or low-fat dairy each day. Examples of 1 cup-equivalent of dairy include 1 cup (240 mL) milk, 8 ounces (250 g) yogurt, 1 ounces (44 g) natural cheese, or 1 cup (240 mL) fortified soy milk. Fats and oils Aim for about 5 teaspoons (21 g) of fats and oils per day. Choose monounsaturated fats, such as canola and olive oils, mayonnaise made with olive oil or avocado oil, avocados, peanut butter, and most nuts, or polyunsaturated fats, such as sunflower, corn, and soybean oils, walnuts, pine nuts, sesame seeds, sunflower seeds, and flaxseed. Beverages Aim for 6 eight-ounce glasses of water per day. Limit coffee to 3-5 eight-ounce cups per day. Limit caffeinated beverages that have added calories, such as soda and energy drinks. If you drink alcohol: Limit how much you have to: 0-1 drink a day if you are female. 0-2 drinks a day if you are female. Know how much alcohol is in your drink.  In the [place] one drink is one 12 oz bottle of beer (355 mL), one 5 oz glass of wine (148 mL), or one 1 oz glass of hard liquor (44 mL). Seasoning and other foods Try not to add too much salt to your food. Try using herbs and spices instead of salt. Try not to add sugar to food. This information is based on [place] nutrition guidelines. To learn more, visit choosemyplate.gov. Exact amounts may vary. You may need different amounts. This information is not intended to replace advice given to you by your health care provider. Make sure you discuss any questions you have with your health care provider. Document Revised: 01/24/2022 Document Reviewed: 01/24/2022 Elsevier Patient Education  2023 Elsevier Inc.  

## 2022-07-25 LAB — COMPLETE METABOLIC PANEL WITH GFR
AG Ratio: 1.7 (calc) (ref 1.0–2.5)
ALT: 13 U/L (ref 6–29)
AST: 15 U/L (ref 10–35)
Albumin: 3.9 g/dL (ref 3.6–5.1)
Alkaline phosphatase (APISO): 59 U/L (ref 37–153)
BUN: 15 mg/dL (ref 7–25)
CO2: 24 mmol/L (ref 20–32)
Calcium: 9.1 mg/dL (ref 8.6–10.4)
Chloride: 102 mmol/L (ref 98–110)
Creat: 0.83 mg/dL (ref 0.60–0.95)
Globulin: 2.3 g/dL (calc) (ref 1.9–3.7)
Glucose, Bld: 88 mg/dL (ref 65–99)
Potassium: 4.9 mmol/L (ref 3.5–5.3)
Sodium: 137 mmol/L (ref 135–146)
Total Bilirubin: 0.7 mg/dL (ref 0.2–1.2)
Total Protein: 6.2 g/dL (ref 6.1–8.1)
eGFR: 69 mL/min/{1.73_m2} (ref >=60)

## 2022-07-25 LAB — URINALYSIS, ROUTINE W REFLEX MICROSCOPIC
Bacteria, UA: NONE SEEN /HPF
Bilirubin Urine: NEGATIVE
Glucose, UA: NEGATIVE
Hgb urine dipstick: NEGATIVE
Hyaline Cast: NONE SEEN /LPF
Nitrite: NEGATIVE
Specific Gravity, Urine: 1.026 (ref 1.001–1.035)
Squamous Epithelial / HPF: NONE SEEN /HPF (ref <=5)
pH: 6 (ref 5.0–8.0)

## 2022-07-25 LAB — CBC WITH DIFFERENTIAL/PLATELET
Absolute Monocytes: 792 cells/uL (ref 200–950)
Basophils Absolute: 79 cells/uL (ref 0–200)
Basophils Relative: 1.1 %
Eosinophils Absolute: 605 cells/uL — ABNORMAL HIGH (ref 15–500)
Eosinophils Relative: 8.4 %
HCT: 40.3 % (ref 35.0–45.0)
Hemoglobin: 13.8 g/dL (ref 11.7–15.5)
Lymphs Abs: 2405 cells/uL (ref 850–3900)
MCH: 31.9 pg (ref 27.0–33.0)
MCHC: 34.2 g/dL (ref 32.0–36.0)
MCV: 93.1 fL (ref 80.0–100.0)
MPV: 10.4 fL (ref 7.5–12.5)
Monocytes Relative: 11 %
Neutro Abs: 3319 cells/uL (ref 1500–7800)
Neutrophils Relative %: 46.1 %
Platelets: 337 10*3/uL (ref 140–400)
RBC: 4.33 10*6/uL (ref 3.80–5.10)
RDW: 13 % (ref 11.0–15.0)
Total Lymphocyte: 33.4 %
WBC: 7.2 10*3/uL (ref 3.8–10.8)

## 2022-07-25 LAB — URINE CULTURE
MICRO NUMBER:: 14699939
SPECIMEN QUALITY:: ADEQUATE

## 2022-07-25 LAB — HEMOGLOBIN A1C
Hgb A1c MFr Bld: 6.1 % of total Hgb — ABNORMAL HIGH (ref <5.7)
Mean Plasma Glucose: 128 mg/dL
eAG (mmol/L): 7.1 mmol/L

## 2022-07-25 LAB — LIPID PANEL
Cholesterol: 128 mg/dL (ref <200)
HDL: 59 mg/dL (ref >=50)
LDL Cholesterol (Calc): 46 mg/dL (calc)
Non-HDL Cholesterol (Calc): 69 mg/dL (calc) (ref <130)
Total CHOL/HDL Ratio: 2.2 (calc) (ref <5.0)
Triglycerides: 147 mg/dL (ref <150)

## 2022-07-25 LAB — VITAMIN D 25 HYDROXY (VIT D DEFICIENCY, FRACTURES): Vit D, 25-Hydroxy: 118 ng/mL — ABNORMAL HIGH (ref 30–100)

## 2022-07-27 ENCOUNTER — Ambulatory Visit: Payer: PPO | Admitting: Gastroenterology

## 2022-07-27 ENCOUNTER — Encounter: Payer: Self-pay | Admitting: Gastroenterology

## 2022-07-27 VITALS — BP 110/60 | HR 81 | Ht 63.0 in | Wt 141.0 lb

## 2022-07-27 DIAGNOSIS — R110 Nausea: Secondary | ICD-10-CM | POA: Diagnosis not present

## 2022-07-27 DIAGNOSIS — R1084 Generalized abdominal pain: Secondary | ICD-10-CM | POA: Diagnosis not present

## 2022-07-27 DIAGNOSIS — R630 Anorexia: Secondary | ICD-10-CM | POA: Insufficient documentation

## 2022-07-27 NOTE — Progress Notes (Signed)
Agree with assessment and plan as outlined.  

## 2022-07-27 NOTE — Progress Notes (Signed)
07/27/2022 [name] [name] [id number] 1935/11/04   HISTORY OF PRESENT ILLNESS:  This is an 87 year old female who is a patient of Dr. Doyne [name].  She has a history of paraesophageal hernias repaired in the past with fundoplication, chronic reflux, dysphagia. History of TIA.   She is here today with complaints of feeling poorly for the past several months.  Reports a lot of nausea.  Does have Phenergan, which she uses intermittently and it does help some.  Says that she woke up through 3:00 this morning feeling sick.  She says that she has been having the symptoms almost every day, but once in a while she will not.  It is mostly nausea, not really any vomiting as she does not tend to bring anything up.  She also reports lower abdominal pain that comes and goes.  Says that at times she will have a gripping type pain in her lower abdomen when her bowels are acting up with diarrhea.  She has only intermittent diarrhea, sometimes has formed stools.  Denies seeing any blood in her stools.  Was previously on PPI therapy, but her PCP stopped it and put her on Carafate 4 times daily instead.  Had been taking her PPI up until just recently when they gave her the Carafate because she was told that she did not need both of those.  CBC and CMP 5 days ago looked good.  Most recent workup:  EGD 07/11/19 - The Z-line was regular. - Patchy, white plaques were found in the middle third of the esophagus and in the lower third of the esophagus consistent with esophageal candidiasis. - The lower third of the esophagus was tortuous with an angulated turn into the Nissen. No obvious stenosis noted at the GEJ (previously had this in 2019 and dilated) - The exam of the esophagus was otherwise normal. - Evidence of a Nissen fundoplication was found in the cardia. The wrap appeared intact. A retained suture was present. I attempted to remove it with forceps but it was buried into the mucosa and not removed. - The exam  of the stomach was otherwise normal. - Biopsies were taken with a cold forceps in the gastric body, at the incisura and in the gastric antrum for Helicobacter pylori testing. - The duodenal bulb and second portion of the duodenum were normal.   Surgical [P], gastric antrum and gastric body - REACTIVE GASTROPATHY - NO H. PYLORI OR INTESTINAL METAPLASIA IDENTIFIED - SEE COMMENT    Colonoscopy 05/17/2017 - The perianal and digital rectal examinations were normal. - A 4 mm polyp was found in the cecum. The polyp was sessile. The polyp was removed with a cold snare. Resection and retrieval were complete. - Two sessile polyps were found in the ascending colon. The polyps were 4 to 5 mm in size. These polyps were removed with a cold snare. Resection and retrieval were complete. - Two sessile polyps were found in the transverse colon. The polyps were 4 to 5 mm in size. These polyps were removed with a cold snare. Resection and retrieval were complete. - A 6 mm polyp was found in the descending colon. The polyp was sessile. The polyp was removed with a cold snare. Resection and retrieval were complete. - Scattered medium-mouthed diverticula were found in the entire colon. - Internal hemorrhoids were found during retroflexion. - The colon was tortous. The exam was otherwise without abnormality.   6 adenomas   EGD 02/20/2018 - Esophagogastric landmarks were identified:  the Z-line was found at 36 cm. - The lower esophagus was tortuous with an angulated turn into the fundoplication. No obvious stenosis was appreciated. - A TTS dilator was passed through the scope. Dilation with a 16-17-18 mm balloon dilator was performed to 16 mm, 17 mm and 18 mm at the gastroesophageal junction without any appreciable mucosal wrent. - Multiple diminutive white plaques were found in the lower third of the esophagus. Brushings for KOH prep were obtained. - The exam of the esophagus was otherwise normal. - Evidence  of a Nissen fundoplication was found in the cardia, with a retained suture visible. The wrap was intact. - Patchy inflammation characterized by erythema was found in the gastric antrum. Biopsies were taken with a cold forceps for histology. - The exam of the stomach was otherwise normal. - The duodenal bulb and second portion of the duodenum were normal.   EGD 01/24/2018 - Z-line, 35 cm from the incisors. - Suspect mild esophagitis dissecans - Tortous esophagus with angulated entrance to fundoplication versus mild stenosis - dilation not performed given volume of food in the stomach - A large amount of food (residue) in the stomach, the procedure was aborted.     EGD 12/25/2015 -  Normal exam, fundoplication noted - per Dr. Loletha [name] Esophageal manometry 08/15/2015 - intact peristalsis, normal LES relaxation, intermittent nonspecific spasm EGD 03/15/2013 - esophageal candidiasis, recurrent 4-5 cm hernia, history of Nissen with [name] lesion Colonoscopy 04/07/2011 - 2 small adenomatous polyps, internal hemorrhoids,    CT angio abdomen / pelvis - 04/05/2017 - normal CT abdomen / pelvis 03/10/2017 - mild stable CBD dilation 18mm post cholecystectomy CT abdomen pelvis 03/27/2016 - normal abdomen,  Barium swallow 12/24/15 - stricture in distal 3rd of esophagus, nonspecific dysmotility Gastric emptying study 03/2013 - normal      DEXA scan 01/27/22 - T score (-) 2.4 - osteopenia    Past Medical History:  Diagnosis Date   Anxiety    takes Xanax daily as needed   Asthma    Albuterol daily as needed   Blood transfusion without reported diagnosis    CAD (coronary artery disease)    LHC 2003 with 40% pLAD, 30% mLAD, 100% D1 (moderate vessel), 100%  D2 (small vessel), 95% mid small OM2.  Pt had PTCA to D1;  D2 and OM2 small vessels and tx medically;  Last Myoview (2012): anterior infarct seen with scar, no ischemia. EF normal. Med Rx // Myoview (12/14):  Low risk; ant scar w/ peri-infarct ischemia; EF  55% with ant AK // Myoview 5/16: EF 55-65, ant, apical scar, no ischemia    Cataract    Chronic back pain    HNP, DDD, spinal stenosis, vertebral compression fractures.    Chronic nausea    takes Zofran daily as needed.  normal gastric emptying study 03/2013   CKD (chronic kidney disease), stage III ([place]) 09/27/2017   Constipation    felt to be functional. takes Senokot and Miralax daily.  treated with Linzess in past.    DIVERTICULOSIS, COLON 08/28/2008   Qualifier: Diagnosis of  By: [name], [name], [name]     Elevated LFTs 2015   probably med induced DILI, from Augmentin vs Pravastatin   GERD (gastroesophageal reflux disease)    hx of esophageal stricture    Hiatal hernia    Paraesophageal hernias. s/p fundoplications in 0000000 and 04/2013   History of bronchitis several of yrs ago   History of colon polyps 03/2009, 03/2011   adenomatous colon polyps,  no high grade dysplasia.    History of vertigo    no meds   Hyperlipidemia    takes Pravastatin daily   Hypertension    takes Amlodipine,Metoprolol,and Losartan daily   Multiple closed pelvic fractures without disruption of pelvic circle (HCC)    Nausea 03/2016   Osteoporosis    PONV (postoperative nausea and vomiting)    yrs ago   Pubic ramus fracture (HCC) 12/20/2019   Slow urinary stream    takes Rapaflo daily   TIA (transient ischemic attack) 1995   no residual effects noted   Vertebral compression fracture ([place]) 06/23/2012   Past Surgical History:  Procedure Laterality Date   APPENDECTOMY     patient unsure of date   [place]     Patient unsure of date.   CORONARY ANGIOPLASTY  11/16/2001   1 stent   ESOPHAGEAL MANOMETRY N/A 03/25/2013   Procedure: ESOPHAGEAL MANOMETRY (EM);  Surgeon: Inda Castle, MD;  Location: [facility] ENDOSCOPY;  Service: Endoscopy;  Laterality: N/A;   ESOPHAGOGASTRODUODENOSCOPY N/A 03/15/2013   Procedure: ESOPHAGOGASTRODUODENOSCOPY (EGD);  Surgeon: Jerene Bears, MD;  Location: [facility] ENDOSCOPY;  Service: Endoscopy;  Laterality: N/A;   ESOPHAGOGASTRODUODENOSCOPY N/A 12/25/2015   Procedure: ESOPHAGOGASTRODUODENOSCOPY (EGD);  Surgeon: Doran Stabler, MD;  Location: [facility] ENDOSCOPY;  Service: Endoscopy;  Laterality: N/A;   EYE SURGERY  11/2000   bilateral cataracts with lens implant   FIXATION KYPHOPLASTY LUMBAR SPINE  X 2   "L3-4"   HEMORRHOID SURGERY     HIATAL HERNIA REPAIR  06/03/2011   Procedure: LAPAROSCOPIC REPAIR OF HIATAL HERNIA;  Surgeon: Adin Hector, MD;  Location: [facility];  Service: General;  Laterality: N/A;   HIATAL HERNIA REPAIR N/A 04/24/2013   Procedure: LAPAROSCOPIC  REPAIR RECURRENT PARASOPHAGEAL HIATAL HERNIA WITH FUNDOPLICATION;  Surgeon: Adin Hector, MD;  Location: [facility];  Service: General;  Laterality: N/A;   INSERTION OF MESH N/A 04/24/2013   Procedure: INSERTION OF MESH;  Surgeon: Adin Hector, MD;  Location: [facility];  Service: General;  Laterality: N/A;   IR KYPHO THORACIC WITH BONE BIOPSY  09/29/2017   IR KYPHO THORACIC WITH BONE BIOPSY  11/03/2017   IR KYPHO THORACIC WITH BONE BIOPSY  02/14/2019   IR KYPHO THORACIC WITH BONE BIOPSY  04/02/2019   LAPAROSCOPIC LYSIS OF ADHESIONS N/A 04/24/2013   Procedure: LAPAROSCOPIC LYSIS OF ADHESIONS;  Surgeon: Adin Hector, MD;  Location: [facility];  Service: General;  Laterality: N/A;   LAPAROSCOPIC NISSEN FUNDOPLICATION  [id number]   Procedure: LAPAROSCOPIC NISSEN FUNDOPLICATION;  Surgeon: Adin Hector, MD;  Location: [facility];  Service: General;  Laterality: N/A;   LUMBAR FUSION  12/07/2015   Right L2-3 facetectomy, posterolateral fusion L2-3 with pedicle screws, rods, local bone graft, Vivigen cancellous chips. Fusion extended to the L1 level with pedicle screws and rods   LUMBAR LAMINECTOMY/DECOMPRESSION MICRODISCECTOMY Right 06/26/2012   Procedure: LUMBAR LAMINECTOMY/DECOMPRESSION MICRODISCECTOMY;  Surgeon: Jessy Oto, MD;  Location: [place];  Service: Orthopedics;   Laterality: Right;  RIGHT L4-5 MICRODISCECTOMY   LUMBAR LAMINECTOMY/DECOMPRESSION MICRODISCECTOMY N/A 05/29/2015   Procedure: RIGHT L2-3 MICRODISCECTOMY;  Surgeon: Jessy Oto, MD;  Location: [place];  Service: Orthopedics;  Laterality: N/A;   TOTAL ABDOMINAL HYSTERECTOMY  1975   partial   UPPER GASTROINTESTINAL ENDOSCOPY      reports that she has never smoked. She has never used smokeless tobacco. She reports that she  does not drink alcohol and does not use drugs. family history includes Arthritis in her brother; Brain cancer in her brother; Colon cancer (age of onset: 96) in her mother; Heart attack (age of onset: 75) in her father; Heart disease in her brother; Kidney disease in her daughter; Ulcers in her brother. Allergies  Allergen Reactions   Ace Inhibitors Cough    Per Dr Melvyn Novas pulmonology 2013   Aspartame Diarrhea and Nausea And Vomiting   Atorvastatin Other (See Comments)    Muscle aches   Biaxin [Clarithromycin] Other (See Comments)    Unknown reaction : PATIENT CAN TOLERATE Z-PAK.  Is written on patient's paper chart.   Daliresp [Roflumilast] Nausea And Vomiting   Erythromycin Other (See Comments)    Unknown reaction: PATIENT CAN TOLERATE Z-PAK.  It is written on patient's paper chart.   Levofloxacin Nausea And Vomiting      Outpatient Encounter Medications as of 07/27/2022  Medication Sig   albuterol (PROVENTIL) (2.5 MG/3ML) 0.083% nebulizer solution Take 3 mLs (2.5 mg total) by nebulization every 4 (four) hours as needed for wheezing or shortness of breath.   albuterol (VENTOLIN HFA) 108 (90 Base) MCG/ACT inhaler INHALE 2 PUFFS BY MOUTH EVERY 4 HOURS AS NEEDED FOR WHEEZING FOR SHORTNESS OF BREATH   ALPRAZolam (XANAX) 0.5 MG tablet Take 1/2 tablet 1- 2 x /day ONLY if needed for Anxiety or Panic Attack &  limit to 5 days /week to avoid Addiction - DO NOT take with Hydrocodone or Codeine   amLODipine (NORVASC) 10 MG tablet Take   1 tablet  Daily for BP & Heart                                                           /                              TAKE                              BY                             MOUTH                         ONCE DAILY   anti-nausea (EMETROL) solution Take 10 mLs by mouth every 15 (fifteen) minutes as needed for nausea or vomiting.   aspirin EC 81 MG tablet Take 81 mg by mouth at bedtime. Every other night   budesonide (PULMICORT) 0.25 MG/2ML nebulizer solution Take 2 mLs (0.25 mg total) by nebulization 4 (four) times daily.   Cholecalciferol (VITAMIN D3) 5000 units CAPS Take 5,000 Units by mouth 2 (two) times daily.    gabapentin (NEURONTIN) 600 MG tablet Take  1/2 to 1 tablet  3 to 4 x /Daily  as needed for Pain   ketoconazole (NIZORAL) 2 % cream Apply to  Ringworm rash  2 x /day   Magnesium 400 MG TABS Take 400 mg by mouth daily at 2 PM.   melatonin 5 MG TABS Take 5 mg by mouth at bedtime as needed.   metoprolol tartrate (LOPRESSOR)  50 MG tablet Take 1 tablet  2 x /day(every 12 hours)  for BP & Heart                                /                          TAKE                      BY                   MOUTH   Multiple Vitamins-Minerals (MULTIVITAMIN WITH MINERALS) tablet Take 1 tablet by mouth daily.   nitroGLYCERIN (NITROSTAT) 0.4 MG SL tablet Place 1 tablet (0.4 mg total) under the tongue every 5 (five) minutes as needed for chest pain.   promethazine (PHENERGAN) 25 MG tablet TAKE 1 TABLET BY MOUTH 4 TIMES DAILY IF  NEEDED  FOR  NAUSEA  AND  VOMITING.   senna-docusate (SENOKOT-S) 8.6-50 MG tablet Take 2 tablets by mouth 2 (two) times daily.   sucralfate (CARAFATE) 1 g tablet Take 1 g by mouth 4 (four) times daily -  with meals and at bedtime.   trospium (SANCTURA) 20 MG tablet Take 20 mg by mouth 2 (two) times daily.   vitamin B-12 (CYANOCOBALAMIN) 1000 MCG tablet Take 5,000 mcg by mouth daily.   [DISCONTINUED] omeprazole (PRILOSEC) 40 MG capsule Take 1 capsule (40 mg total) by mouth daily. Take  1 capsule 2 x /day to Prevent  Indigestion & Heartburn   [DISCONTINUED] predniSONE (DELTASONE) 10 MG tablet Take  4 each am x 2 days,   2 each am x 2 days,  1 each am x 2 days and stop   No facility-administered encounter medications on file as of 07/27/2022.     REVIEW OF SYSTEMS  : All other systems reviewed and negative except where noted in the History of Present Illness.   PHYSICAL EXAM: BP 110/60   Pulse 81   Ht 5\' 3"  (1.6 m)   Wt 141 lb (64 kg)   BMI 24.98 kg/m  General: Well developed white female in no acute distress Head: Normocephalic and atraumatic Eyes:  Sclerae anicteric, conjunctiva pink. Ears: Normal auditory acuity Lungs: Clear throughout to auscultation; no W/R/R. Heart: Regular rate and rhythm; no M/R/G. Abdomen: Soft, non-distended.  BS present.  Mid-lower abdominal TTP. Musculoskeletal: Symmetrical with no gross deformities  Skin: No lesions on visible extremities Extremities: No edema  Neurological: Alert oriented x 4, grossly non-focal Psychological:  Alert and cooperative. Normal mood and affect  ASSESSMENT AND PLAN: *87 year old female with complaints of nausea, generalized abdominal pain, poor appetite/p.o. intake.  Has been feeling poorly for the past several months.  Nausea but no vomiting, mid to lower abdominal pain with intermittent diarrhea, poor appetite.  Will check a CT scan of the abdomen pelvis with contrast.  CC:  [name], MD

## 2022-07-27 NOTE — Patient Instructions (Signed)
You are scheduled on 08/05/2022 at 11:40am. You should arrive 15 minutes prior to your appointment time for registration. Please follow the written instructions below on the day of your exam:  WARNING: IF YOU ARE ALLERGIC TO IODINE/X-RAY DYE, PLEASE NOTIFY RADIOLOGY IMMEDIATELY AT [id number]! YOU WILL BE GIVEN A 13 HOUR PREMEDICATION PREP.  Go to [facility] to pick up your oral contrast in the next couple of days  ________________________________________________________________________   Due to recent changes in healthcare laws, you may see the results of your imaging and laboratory studies on MyChart before your provider has had a chance to review them.  We understand that in some cases there may be results that are confusing or concerning to you. Not all laboratory results come back in the same time frame and the provider may be waiting for multiple results in order to interpret others.  Please give [place] 48 hours in order for your provider to thoroughly review all the results before contacting the office for clarification of your results.

## 2022-08-01 ENCOUNTER — Encounter: Payer: Self-pay | Admitting: Pulmonary Disease

## 2022-08-01 ENCOUNTER — Ambulatory Visit: Payer: PPO | Admitting: Pulmonary Disease

## 2022-08-01 VITALS — BP 120/60 | HR 71 | Ht 63.0 in | Wt 143.4 lb

## 2022-08-01 DIAGNOSIS — J4541 Moderate persistent asthma with (acute) exacerbation: Secondary | ICD-10-CM | POA: Diagnosis not present

## 2022-08-01 MED ORDER — BREZTRI AEROSPHERE 160-9-4.8 MCG/ACT IN AERO: 2.0000 | INHALATION_SPRAY | Freq: Two times a day (BID) | RESPIRATORY_TRACT | 0 refills | Status: DC

## 2022-08-01 MED ORDER — PREDNISONE 10 MG PO TABS: ORAL_TABLET | ORAL | 0 refills | Status: DC

## 2022-08-01 NOTE — Progress Notes (Signed)
Synopsis: Referred in March 2024 for asthma by [name], MD  Subjective:   PATIENT ID: [name] [name] GENDER: female DOB: 1936-03-15, MRN: [id number]  Chief Complaint  Patient presents with   Acute Visit    SOB    This is a 87 year old female past medical history of CKD, gastroesophageal reflux, hiatal hernia, asthma.  Follows with Dr. Melvyn Novas.  She has had difficulty affording her inhalers.  She is currently just using albuterol.  She presents today with chest tightness and shortness of breath and wheezing.  Patient has had increased symptoms over the past 48 hours.    Past Medical History:  Diagnosis Date   Anxiety    takes Xanax daily as needed   Asthma    Albuterol daily as needed   Blood transfusion without reported diagnosis    CAD (coronary artery disease)    LHC 2003 with 40% pLAD, 30% mLAD, 100% D1 (moderate vessel), 100%  D2 (small vessel), 95% mid small OM2.  Pt had PTCA to D1;  D2 and OM2 small vessels and tx medically;  Last Myoview (2012): anterior infarct seen with scar, no ischemia. EF normal. Med Rx // Myoview (12/14):  Low risk; ant scar w/ peri-infarct ischemia; EF 55% with ant AK // Myoview 5/16: EF 55-65, ant, apical scar, no ischemia    Cataract    Chronic back pain    HNP, DDD, spinal stenosis, vertebral compression fractures.    Chronic nausea    takes Zofran daily as needed.  normal gastric emptying study 03/2013   CKD (chronic kidney disease), stage III ([place]) 09/27/2017   Constipation    felt to be functional. takes Senokot and Miralax daily.  treated with Linzess in past.    DIVERTICULOSIS, COLON 08/28/2008   Qualifier: Diagnosis of  By: [name], [name], [name]     Elevated LFTs 2015   probably med induced DILI, from Augmentin vs Pravastatin   GERD (gastroesophageal reflux disease)    hx of esophageal stricture    Hiatal hernia    Paraesophageal hernias. s/p fundoplications in 0000000 and 04/2013   History of bronchitis several of yrs ago   History  of colon polyps 03/2009, 03/2011   adenomatous colon polyps, no high grade dysplasia.    History of vertigo    no meds   Hyperlipidemia    takes Pravastatin daily   Hypertension    takes Amlodipine,Metoprolol,and Losartan daily   Multiple closed pelvic fractures without disruption of pelvic circle (HCC)    Nausea 03/2016   Osteoporosis    PONV (postoperative nausea and vomiting)    yrs ago   Pubic ramus fracture (HCC) 12/20/2019   Slow urinary stream    takes Rapaflo daily   TIA (transient ischemic attack) 1995   no residual effects noted   Vertebral compression fracture ([place]) 06/23/2012     Family History  Problem Relation Age of Onset   Colon cancer Mother 58   Heart attack Father 54       heart attack   Brain cancer Brother        tumor    Heart disease Brother    Ulcers Brother    Arthritis Brother    Kidney disease Daughter    Esophageal cancer Neg Hx    Stomach cancer Neg Hx      Past Surgical History:  Procedure Laterality Date   APPENDECTOMY     patient unsure of date   BACK SURGERY     BLADDER  REPAIR     CHOLECYSTECTOMY     Patient unsure of date.   CORONARY ANGIOPLASTY  11/16/2001   1 stent   ESOPHAGEAL MANOMETRY N/A 03/25/2013   Procedure: ESOPHAGEAL MANOMETRY (EM);  Surgeon: Inda Castle, MD;  Location: [facility];  Service: Endoscopy;  Laterality: N/A;   ESOPHAGOGASTRODUODENOSCOPY N/A 03/15/2013   Procedure: ESOPHAGOGASTRODUODENOSCOPY (EGD);  Surgeon: Jerene Bears, MD;  Location: [facility] ENDOSCOPY;  Service: Endoscopy;  Laterality: N/A;   ESOPHAGOGASTRODUODENOSCOPY N/A 12/25/2015   Procedure: ESOPHAGOGASTRODUODENOSCOPY (EGD);  Surgeon: Doran Stabler, MD;  Location: [facility] ENDOSCOPY;  Service: Endoscopy;  Laterality: N/A;   EYE SURGERY  11/2000   bilateral cataracts with lens implant   FIXATION KYPHOPLASTY LUMBAR SPINE  X 2   "L3-4"   HEMORRHOID SURGERY     HIATAL HERNIA REPAIR  06/03/2011   Procedure: LAPAROSCOPIC REPAIR OF HIATAL HERNIA;  Surgeon:  Adin Hector, MD;  Location: [facility];  Service: General;  Laterality: N/A;   HIATAL HERNIA REPAIR N/A 04/24/2013   Procedure: LAPAROSCOPIC  REPAIR RECURRENT PARASOPHAGEAL HIATAL HERNIA WITH FUNDOPLICATION;  Surgeon: Adin Hector, MD;  Location: [facility];  Service: General;  Laterality: N/A;   INSERTION OF MESH N/A 04/24/2013   Procedure: INSERTION OF MESH;  Surgeon: Adin Hector, MD;  Location: [facility];  Service: General;  Laterality: N/A;   IR KYPHO THORACIC WITH BONE BIOPSY  09/29/2017   IR KYPHO THORACIC WITH BONE BIOPSY  11/03/2017   IR KYPHO THORACIC WITH BONE BIOPSY  02/14/2019   IR KYPHO THORACIC WITH BONE BIOPSY  04/02/2019   LAPAROSCOPIC LYSIS OF ADHESIONS N/A 04/24/2013   Procedure: LAPAROSCOPIC LYSIS OF ADHESIONS;  Surgeon: Adin Hector, MD;  Location: [facility];  Service: General;  Laterality: N/A;   LAPAROSCOPIC NISSEN FUNDOPLICATION  [id number]   Procedure: LAPAROSCOPIC NISSEN FUNDOPLICATION;  Surgeon: Adin Hector, MD;  Location: [facility];  Service: General;  Laterality: N/A;   LUMBAR FUSION  12/07/2015   Right L2-3 facetectomy, posterolateral fusion L2-3 with pedicle screws, rods, local bone graft, Vivigen cancellous chips. Fusion extended to the L1 level with pedicle screws and rods   LUMBAR LAMINECTOMY/DECOMPRESSION MICRODISCECTOMY Right 06/26/2012   Procedure: LUMBAR LAMINECTOMY/DECOMPRESSION MICRODISCECTOMY;  Surgeon: Jessy Oto, MD;  Location: [facility];  Service: Orthopedics;  Laterality: Right;  RIGHT L4-5 MICRODISCECTOMY   LUMBAR LAMINECTOMY/DECOMPRESSION MICRODISCECTOMY N/A 05/29/2015   Procedure: RIGHT L2-3 MICRODISCECTOMY;  Surgeon: Jessy Oto, MD;  Location: [facility];  Service: Orthopedics;  Laterality: N/A;   TOTAL ABDOMINAL HYSTERECTOMY  1975   partial   UPPER GASTROINTESTINAL ENDOSCOPY      Social History   Socioeconomic History   Marital status: Married    Spouse name: [name]   Number of children: 2   Years of education: 12   Highest education level:  Not on file  Occupational History   Occupation: Retired    Fish farm manager: RETIRED    Comment: worked at [employer] Use   Smoking status: Never   Smokeless tobacco: Never  Vaping Use   Vaping Use: Never used  Substance and Sexual Activity   Alcohol use: No   Drug use: Never   Sexual activity: Not Currently    Partners: Male    Birth control/protection: Post-menopausal, Surgical  Other Topics Concern   Not on file  Social History Narrative   Lives with husband.   Right-handed.   3 cups caffeine per day.   Social Determinants of Radio broadcast assistant  Strain: Not on file  Food Insecurity: Not on file  Transportation Needs: No Transportation Needs (03/17/2021)   PRAPARE - Hydrologist (Medical): No    Lack of Transportation (Non-Medical): No  Physical Activity: Not on file  Stress: Not on file  Social Connections: Not on file  Intimate Partner Violence: Not on file     Allergies  Allergen Reactions   Ace Inhibitors Cough    Per Dr Melvyn Novas pulmonology 2013   Aspartame Diarrhea and Nausea And Vomiting   Atorvastatin Other (See Comments)    Muscle aches   Biaxin [Clarithromycin] Other (See Comments)    Unknown reaction : PATIENT CAN TOLERATE Z-PAK.  Is written on patient's paper chart.   Daliresp [Roflumilast] Nausea And Vomiting   Erythromycin Other (See Comments)    Unknown reaction: PATIENT CAN TOLERATE Z-PAK.  It is written on patient's paper chart.   Levofloxacin Nausea And Vomiting     Outpatient Medications Prior to Visit  Medication Sig Dispense Refill   albuterol (PROVENTIL) (2.5 MG/3ML) 0.083% nebulizer solution Take 3 mLs (2.5 mg total) by nebulization every 4 (four) hours as needed for wheezing or shortness of breath. 75 mL 2   albuterol (VENTOLIN HFA) 108 (90 Base) MCG/ACT inhaler INHALE 2 PUFFS BY MOUTH EVERY 4 HOURS AS NEEDED FOR WHEEZING FOR SHORTNESS OF BREATH 9 g 11   ALPRAZolam (XANAX) 0.5 MG tablet Take 1/2  tablet 1- 2 x /day ONLY if needed for Anxiety or Panic Attack &  limit to 5 days /week to avoid Addiction - DO NOT take with Hydrocodone or Codeine 10 tablet 0   amLODipine (NORVASC) 10 MG tablet Take   1 tablet  Daily for BP & Heart                                                          /                              TAKE                              BY                             MOUTH                         ONCE DAILY 90 tablet 3   anti-nausea (EMETROL) solution Take 10 mLs by mouth every 15 (fifteen) minutes as needed for nausea or vomiting.     aspirin EC 81 MG tablet Take 81 mg by mouth at bedtime. Every other night     budesonide (PULMICORT) 0.25 MG/2ML nebulizer solution Take 2 mLs (0.25 mg total) by nebulization 4 (four) times daily. 60 mL 12   Cholecalciferol (VITAMIN D3) 5000 units CAPS Take 5,000 Units by mouth 2 (two) times daily.      gabapentin (NEURONTIN) 600 MG tablet Take  1/2 to 1 tablet  3 to 4 x /Daily  as needed for Pain 360 tablet 1   ketoconazole (NIZORAL) 2 % cream Apply to  Ringworm rash  2  x /day 60 g 3   Magnesium 400 MG TABS Take 400 mg by mouth daily at 2 PM.     melatonin 5 MG TABS Take 5 mg by mouth at bedtime as needed.     metoprolol tartrate (LOPRESSOR) 50 MG tablet Take 1 tablet  2 x /day(every 12 hours)  for BP & Heart                                /                          TAKE                      BY                   MOUTH 180 tablet 3   Multiple Vitamins-Minerals (MULTIVITAMIN WITH MINERALS) tablet Take 1 tablet by mouth daily.     nitroGLYCERIN (NITROSTAT) 0.4 MG SL tablet Place 1 tablet (0.4 mg total) under the tongue every 5 (five) minutes as needed for chest pain. 90 tablet 3   promethazine (PHENERGAN) 25 MG tablet TAKE 1 TABLET BY MOUTH 4 TIMES DAILY IF  NEEDED  FOR  NAUSEA  AND  VOMITING. 120 tablet 0   senna-docusate (SENOKOT-S) 8.6-50 MG tablet Take 2 tablets by mouth 2 (two) times daily. 60 tablet 1   sucralfate (CARAFATE) 1 g tablet Take 1 g by  mouth 4 (four) times daily -  with meals and at bedtime.     trospium (SANCTURA) 20 MG tablet Take 20 mg by mouth 2 (two) times daily.     vitamin B-12 (CYANOCOBALAMIN) 1000 MCG tablet Take 5,000 mcg by mouth daily.     No facility-administered medications prior to visit.    Review of Systems  Constitutional:  Negative for chills, fever, malaise/fatigue and weight loss.  HENT:  Negative for hearing loss, sore throat and tinnitus.   Eyes:  Negative for blurred vision and double vision.  Respiratory:  Positive for cough, sputum production, shortness of breath and wheezing. Negative for hemoptysis and stridor.   Cardiovascular:  Negative for chest pain, palpitations, orthopnea, leg swelling and PND.  Gastrointestinal:  Negative for abdominal pain, constipation, diarrhea, heartburn, nausea and vomiting.  Genitourinary:  Negative for dysuria, hematuria and urgency.  Musculoskeletal:  Negative for joint pain and myalgias.  Skin:  Negative for itching and rash.  Neurological:  Negative for dizziness, tingling, weakness and headaches.  Endo/Heme/Allergies:  Negative for environmental allergies. Does not bruise/bleed easily.  Psychiatric/Behavioral:  Negative for depression. The patient is not nervous/anxious and does not have insomnia.   All other systems reviewed and are negative.    Objective:  Physical Exam Vitals reviewed.  Constitutional:      General: She is not in acute distress.    Appearance: She is well-developed.  HENT:     Head: Normocephalic and atraumatic.  Eyes:     General: No scleral icterus.    Conjunctiva/sclera: Conjunctivae normal.     Pupils: Pupils are equal, round, and reactive to light.  Neck:     Vascular: No JVD.     Trachea: No tracheal deviation.  Cardiovascular:     Rate and Rhythm: Normal rate and regular rhythm.     Heart sounds: Normal heart sounds. No murmur heard. Pulmonary:     Effort: Pulmonary effort  is normal. No tachypnea, accessory muscle  usage or respiratory distress.     Breath sounds: No stridor. Wheezing and rhonchi present. No rales.  Abdominal:     General: There is no distension.     Palpations: Abdomen is soft.     Tenderness: There is no abdominal tenderness.  Musculoskeletal:        General: No tenderness.     Cervical back: Neck supple.  Lymphadenopathy:     Cervical: No cervical adenopathy.  Skin:    General: Skin is warm and dry.     Capillary Refill: Capillary refill takes less than 2 seconds.     Findings: No rash.  Neurological:     Mental Status: She is alert and oriented to person, place, and time.  Psychiatric:        Behavior: Behavior normal.      Vitals:   08/01/22 1419  BP: 120/60  Pulse: 71  SpO2: 96%  Weight: 143 lb 6.4 oz (65 kg)  Height: 5\' 3"  (1.6 m)   96% on RA BMI Readings from Last 3 Encounters:  08/01/22 25.40 kg/m  07/27/22 24.98 kg/m  07/22/22 25.15 kg/m   Wt Readings from Last 3 Encounters:  08/01/22 143 lb 6.4 oz (65 kg)  07/27/22 141 lb (64 kg)  07/22/22 142 lb (64.4 kg)     CBC    Component Value Date/Time   WBC 7.2 07/22/2022 1043   RBC 4.33 07/22/2022 1043   HGB 13.8 07/22/2022 1043   HGB 14.0 02/19/2013 1346   HCT 40.3 07/22/2022 1043   HCT 41.1 02/19/2013 1346   PLT 337 07/22/2022 1043   PLT 456 (H) 02/19/2013 1346   MCV 93.1 07/22/2022 1043   MCV 85.3 02/19/2013 1346   MCH 31.9 07/22/2022 1043   MCHC 34.2 07/22/2022 1043   RDW 13.0 07/22/2022 1043   RDW 14.1 02/19/2013 1346   LYMPHSABS 2,405 07/22/2022 1043   LYMPHSABS 2.3 02/19/2013 1346   MONOABS 0.9 01/04/2021 1041   MONOABS 0.7 02/19/2013 1346   EOSABS 605 (H) 07/22/2022 1043   EOSABS 0.2 02/19/2013 1346   BASOSABS 79 07/22/2022 1043   BASOSABS 0.0 02/19/2013 1346     Chest Imaging:   Pulmonary Functions Testing Results:    Latest Ref Rng & Units 09/02/2020   11:15 AM  PFT Results  FVC-Pre L 1.73   FVC-Predicted Pre % 68   FVC-Post L 1.78   FVC-Predicted Post % 70   Pre  FEV1/FVC % % 72   Post FEV1/FCV % % 77   FEV1-Pre L 1.23   FEV1-Predicted Pre % 65   FEV1-Post L 1.38   DLCO uncorrected ml/min/mmHg 19.41   DLCO UNC% % 103   DLCO corrected ml/min/mmHg 18.32   DLCO COR %Predicted % 98   DLVA Predicted % 132     FeNO:   Pathology:   Echocardiogram:   Heart Catheterization:     Assessment & Plan:     ICD-10-CM   1. Moderate persistent asthma with exacerbation  J45.41       Discussion:  This is an 87 year old female presents today with acute exacerbation of asthma.  Increase shortness of breath chest tightness and wheezing.  Plan: Start prednisone taper. Give her samples today of Breztri as that is the only thing that we have for potential controller medications in the office. She has been unable to afford her Advair. Please keep appointment with Dr. Melvyn Novas in 4 weeks for follow-up. Continue albuterol for  shortness of breath and wheezing.  If she is not getting any better the next couple days she is post to let us know.    Current Outpatient Medications:    albuterol (PROVENTIL) (2.5 MG/3ML) 0.083% nebulizer solution, Take 3 mLs (2.5 mg total) by nebulization every 4 (four) hours as needed for wheezing or shortness of breath., Disp: 75 mL, Rfl: 2   albuterol (VENTOLIN HFA) 108 (90 Base) MCG/ACT inhaler, INHALE 2 PUFFS BY MOUTH EVERY 4 HOURS AS NEEDED FOR WHEEZING FOR SHORTNESS OF BREATH, Disp: 9 g, Rfl: 11   ALPRAZolam (XANAX) 0.5 MG tablet, Take 1/2 tablet 1- 2 x /day ONLY if needed for Anxiety or Panic Attack &  limit to 5 days /week to avoid Addiction - DO NOT take with Hydrocodone or Codeine, Disp: 10 tablet, Rfl: 0   amLODipine (NORVASC) 10 MG tablet, Take   1 tablet  Daily for BP & Heart                                                          /                              TAKE                              BY                             MOUTH                         ONCE DAILY, Disp: 90 tablet, Rfl: 3   anti-nausea (EMETROL) solution,  Take 10 mLs by mouth every 15 (fifteen) minutes as needed for nausea or vomiting., Disp: , Rfl:    aspirin EC 81 MG tablet, Take 81 mg by mouth at bedtime. Every other night, Disp: , Rfl:    budesonide (PULMICORT) 0.25 MG/2ML nebulizer solution, Take 2 mLs (0.25 mg total) by nebulization 4 (four) times daily., Disp: 60 mL, Rfl: 12   Cholecalciferol (VITAMIN D3) 5000 units CAPS, Take 5,000 Units by mouth 2 (two) times daily. , Disp: , Rfl:    gabapentin (NEURONTIN) 600 MG tablet, Take  1/2 to 1 tablet  3 to 4 x /Daily  as needed for Pain, Disp: 360 tablet, Rfl: 1   ketoconazole (NIZORAL) 2 % cream, Apply to  Ringworm rash  2 x /day, Disp: 60 g, Rfl: 3   Magnesium 400 MG TABS, Take 400 mg by mouth daily at 2 PM., Disp: , Rfl:    melatonin 5 MG TABS, Take 5 mg by mouth at bedtime as needed., Disp: , Rfl:    metoprolol tartrate (LOPRESSOR) 50 MG tablet, Take 1 tablet  2 x /day(every 12 hours)  for BP & Heart                                /                          TAKE  BY                   MOUTH, Disp: 180 tablet, Rfl: 3   Multiple Vitamins-Minerals (MULTIVITAMIN WITH MINERALS) tablet, Take 1 tablet by mouth daily., Disp: , Rfl:    nitroGLYCERIN (NITROSTAT) 0.4 MG SL tablet, Place 1 tablet (0.4 mg total) under the tongue every 5 (five) minutes as needed for chest pain., Disp: 90 tablet, Rfl: 3   predniSONE (DELTASONE) 10 MG tablet, Take 4 tabs by mouth once daily x4 days, then 3 tabs x4 days, 2 tabs x4 days, 1 tab x4 days and stop., Disp: 40 tablet, Rfl: 0   promethazine (PHENERGAN) 25 MG tablet, TAKE 1 TABLET BY MOUTH 4 TIMES DAILY IF  NEEDED  FOR  NAUSEA  AND  VOMITING., Disp: 120 tablet, Rfl: 0   senna-docusate (SENOKOT-S) 8.6-50 MG tablet, Take 2 tablets by mouth 2 (two) times daily., Disp: 60 tablet, Rfl: 1   sucralfate (CARAFATE) 1 g tablet, Take 1 g by mouth 4 (four) times daily -  with meals and at bedtime., Disp: , Rfl:    trospium (SANCTURA) 20 MG tablet, Take 20 mg by  mouth 2 (two) times daily., Disp: , Rfl:    vitamin B-12 (CYANOCOBALAMIN) 1000 MCG tablet, Take 5,000 mcg by mouth daily., Disp: , Rfl:    Garner Nash, DO [facility] 08/01/2022 2:54 PM

## 2022-08-01 NOTE — Patient Instructions (Addendum)
Thank you for visiting Dr. Valeta Harms at [facility]. Today we recommend the following:  Meds ordered this encounter  Medications   predniSONE (DELTASONE) 10 MG tablet    Sig: Take 4 tabs by mouth once daily x4 days, then 3 tabs x4 days, 2 tabs x4 days, 1 tab x4 days and stop.    Dispense:  40 tablet    Refill:  0   Breztri samples   Return in about 4 weeks (around 08/29/2022) for w/ Dr. Melvyn Novas.    Please do your part to reduce the spread of COVID-19.

## 2022-08-01 NOTE — Addendum Note (Signed)
Addended by: Alvin Critchley on: 08/01/2022 04:10 PM   Modules accepted: Orders

## 2022-08-05 ENCOUNTER — Ambulatory Visit
Admission: RE | Admit: 2022-08-05 | Discharge: 2022-08-05 | Disposition: A | Discharge status: Home / Self Care | Payer: PPO | Source: Ambulatory Visit | Attending: Gastroenterology | Admitting: Gastroenterology

## 2022-08-05 DIAGNOSIS — R634 Abnormal weight loss: Secondary | ICD-10-CM | POA: Diagnosis not present

## 2022-08-05 DIAGNOSIS — K8689 Other specified diseases of pancreas: Secondary | ICD-10-CM | POA: Diagnosis not present

## 2022-08-05 DIAGNOSIS — R110 Nausea: Secondary | ICD-10-CM | POA: Diagnosis not present

## 2022-08-05 DIAGNOSIS — S3282XA Multiple fractures of pelvis without disruption of pelvic ring, initial encounter for closed fracture: Secondary | ICD-10-CM | POA: Diagnosis not present

## 2022-08-05 DIAGNOSIS — R630 Anorexia: Secondary | ICD-10-CM

## 2022-08-05 DIAGNOSIS — R1084 Generalized abdominal pain: Secondary | ICD-10-CM

## 2022-08-05 MED ORDER — IOPAMIDOL (ISOVUE-300) INJECTION 61%
100.0000 mL | Freq: Once | INTRAVENOUS | Status: AC | PRN
  Administered 2022-08-05: 100 mL via INTRAVENOUS

## 2022-08-10 ENCOUNTER — Other Ambulatory Visit: Payer: Self-pay

## 2022-08-10 DIAGNOSIS — R197 Diarrhea, unspecified: Secondary | ICD-10-CM

## 2022-08-10 DIAGNOSIS — R1084 Generalized abdominal pain: Secondary | ICD-10-CM

## 2022-08-10 DIAGNOSIS — H26493 Other secondary cataract, bilateral: Secondary | ICD-10-CM | POA: Diagnosis not present

## 2022-08-10 DIAGNOSIS — Z961 Presence of intraocular lens: Secondary | ICD-10-CM | POA: Diagnosis not present

## 2022-08-10 DIAGNOSIS — R110 Nausea: Secondary | ICD-10-CM

## 2022-08-10 DIAGNOSIS — H524 Presbyopia: Secondary | ICD-10-CM | POA: Diagnosis not present

## 2022-08-10 DIAGNOSIS — H43813 Vitreous degeneration, bilateral: Secondary | ICD-10-CM | POA: Diagnosis not present

## 2022-08-12 ENCOUNTER — Other Ambulatory Visit (INDEPENDENT_AMBULATORY_CARE_PROVIDER_SITE_OTHER): Payer: PPO

## 2022-08-12 DIAGNOSIS — R197 Diarrhea, unspecified: Secondary | ICD-10-CM

## 2022-08-12 DIAGNOSIS — R110 Nausea: Secondary | ICD-10-CM

## 2022-08-12 DIAGNOSIS — R1084 Generalized abdominal pain: Secondary | ICD-10-CM | POA: Diagnosis not present

## 2022-08-12 LAB — COMPREHENSIVE METABOLIC PANEL
ALT: 34 U/L (ref 0–35)
AST: 17 U/L (ref 0–37)
Albumin: 4.2 g/dL (ref 3.5–5.2)
Alkaline Phosphatase: 67 U/L (ref 39–117)
BUN: 26 mg/dL — ABNORMAL HIGH (ref 6–23)
CO2: 27 mEq/L (ref 19–32)
Calcium: 9.5 mg/dL (ref 8.4–10.5)
Chloride: 103 mEq/L (ref 96–112)
Creatinine, Ser: 0.89 mg/dL (ref 0.40–1.20)
GFR: 59.39 mL/min — ABNORMAL LOW (ref >60.00)
Glucose, Bld: 86 mg/dL (ref 70–99)
Potassium: 4.1 mEq/L (ref 3.5–5.1)
Sodium: 139 mEq/L (ref 135–145)
Total Bilirubin: 0.6 mg/dL (ref 0.2–1.2)
Total Protein: 7 g/dL (ref 6.0–8.3)

## 2022-08-12 LAB — CBC WITH DIFFERENTIAL/PLATELET
Basophils Absolute: 0 10*3/uL (ref 0.0–0.1)
Basophils Relative: 0.2 % (ref 0.0–3.0)
Eosinophils Absolute: 0 10*3/uL (ref 0.0–0.7)
Eosinophils Relative: 0.1 % (ref 0.0–5.0)
HCT: 45.6 % (ref 36.0–46.0)
Hemoglobin: 14.9 g/dL (ref 12.0–15.0)
Lymphocytes Relative: 11.1 % — ABNORMAL LOW (ref 12.0–46.0)
Lymphs Abs: 1.7 10*3/uL (ref 0.7–4.0)
MCHC: 32.7 g/dL (ref 30.0–36.0)
MCV: 95.5 fl (ref 78.0–100.0)
Monocytes Absolute: 0.6 10*3/uL (ref 0.1–1.0)
Monocytes Relative: 3.8 % (ref 3.0–12.0)
Neutro Abs: 13.1 10*3/uL — ABNORMAL HIGH (ref 1.4–7.7)
Neutrophils Relative %: 84.8 % — ABNORMAL HIGH (ref 43.0–77.0)
Platelets: 392 10*3/uL (ref 150.0–400.0)
RBC: 4.78 Mil/uL (ref 3.87–5.11)
RDW: 15.2 % (ref 11.5–15.5)
WBC: 15.5 10*3/uL — ABNORMAL HIGH (ref 4.0–10.5)

## 2022-08-12 LAB — SEDIMENTATION RATE: Sed Rate: 16 mm/hr (ref 0–30)

## 2022-08-12 LAB — C-REACTIVE PROTEIN: CRP: <1 mg/dL (ref 0.5–20.0)

## 2022-08-15 ENCOUNTER — Other Ambulatory Visit: Payer: PPO

## 2022-08-15 DIAGNOSIS — R197 Diarrhea, unspecified: Secondary | ICD-10-CM | POA: Diagnosis not present

## 2022-08-15 DIAGNOSIS — R1084 Generalized abdominal pain: Secondary | ICD-10-CM

## 2022-08-15 DIAGNOSIS — R110 Nausea: Secondary | ICD-10-CM

## 2022-08-17 LAB — CLOSTRIDIUM DIFFICILE BY PCR: Toxigenic C. Difficile by PCR: NEGATIVE

## 2022-08-19 LAB — STOOL CULTURE: E coli, Shiga toxin Assay: NEGATIVE

## 2022-08-25 DIAGNOSIS — D044 Carcinoma in situ of skin of scalp and neck: Secondary | ICD-10-CM | POA: Diagnosis not present

## 2022-08-30 ENCOUNTER — Ambulatory Visit (INDEPENDENT_AMBULATORY_CARE_PROVIDER_SITE_OTHER): Payer: PPO | Admitting: Gastroenterology

## 2022-08-30 ENCOUNTER — Encounter: Payer: Self-pay | Admitting: Gastroenterology

## 2022-08-30 VITALS — BP 132/70 | HR 60 | Ht 63.0 in | Wt 148.0 lb

## 2022-08-30 DIAGNOSIS — R1032 Left lower quadrant pain: Secondary | ICD-10-CM

## 2022-08-30 DIAGNOSIS — R933 Abnormal findings on diagnostic imaging of other parts of digestive tract: Secondary | ICD-10-CM

## 2022-08-30 DIAGNOSIS — R112 Nausea with vomiting, unspecified: Secondary | ICD-10-CM | POA: Diagnosis not present

## 2022-08-30 NOTE — Progress Notes (Signed)
08/30/2022 [name] [name] [id number] 20-Jun-1935   HISTORY OF PRESENT ILLNESS:   This is an 87 year old female who is a patient of Dr. Lanetta Inch.  She has a history of paraesophageal hernias repaired in the past with fundoplication, chronic reflux, dysphagia, history of adenomatous colon polyps. History of TIA.    I saw her on 07/27/2022 and she was feeling poorly.  A lot of nausea, which is chronic/recurrent.  Was also complaining of diarrhea and lower abdominal pain.  Ordered CT scan as below.  CT scan of the abdomen and pelvis with contrast on 08/07/2022:  IMPRESSION: 1. No acute abdominopelvic abnormality. No CT finding of malignancy in the abdomen or pelvis. 2. Featureless appearance of the descending and sigmoid colon without mural thickening or inflammatory changes, which can be seen in the setting of chronic inflammatory bowel disease. 3.  Aortic Atherosclerosis (ICD10-I70.0).  Stool studies for infectious source were ordered and were negative.  CRP and sed rate were normal.  She tells me that the other night her husband fell in the garage and she became very upset and anxious.  She says that if anything happens to him that she might as well be buried too because she cannot live without him.  At that time she developed some chest pain.  She is going to make an appointment cardiology is to be sure they do not think it is anything else going on.     Most recent workup:  EGD 07/11/19 - The Z-line was regular. - Patchy, white plaques were found in the middle third of the esophagus and in the lower third of the esophagus consistent with esophageal candidiasis. - The lower third of the esophagus was tortuous with an angulated turn into the Nissen. No obvious stenosis noted at the GEJ (previously had this in 2019 and dilated) - The exam of the esophagus was otherwise normal. - Evidence of a Nissen fundoplication was found in the cardia. The wrap appeared intact. A retained suture  was present. I attempted to remove it with forceps but it was buried into the mucosa and not removed. - The exam of the stomach was otherwise normal. - Biopsies were taken with a cold forceps in the gastric body, at the incisura and in the gastric antrum for Helicobacter pylori testing. - The duodenal bulb and second portion of the duodenum were normal.   Surgical [P], gastric antrum and gastric body - REACTIVE GASTROPATHY - NO H. PYLORI OR INTESTINAL METAPLASIA IDENTIFIED - SEE COMMENT    Colonoscopy 05/17/2017 - The perianal and digital rectal examinations were normal. - A 4 mm polyp was found in the cecum. The polyp was sessile. The polyp was removed with a cold snare. Resection and retrieval were complete. - Two sessile polyps were found in the ascending colon. The polyps were 4 to 5 mm in size. These polyps were removed with a cold snare. Resection and retrieval were complete. - Two sessile polyps were found in the transverse colon. The polyps were 4 to 5 mm in size. These polyps were removed with a cold snare. Resection and retrieval were complete. - A 6 mm polyp was found in the descending colon. The polyp was sessile. The polyp was removed with a cold snare. Resection and retrieval were complete. - Scattered medium-mouthed diverticula were found in the entire colon. - Internal hemorrhoids were found during retroflexion. - The colon was tortous. The exam was otherwise without abnormality.   6 adenomas   EGD 02/20/2018 -  Esophagogastric landmarks were identified: the Z-line was found at 36 cm. - The lower esophagus was tortuous with an angulated turn into the fundoplication. No obvious stenosis was appreciated. - A TTS dilator was passed through the scope. Dilation with a 16-17-18 mm balloon dilator was performed to 16 mm, 17 mm and 18 mm at the gastroesophageal junction without any appreciable mucosal wrent. - Multiple diminutive white plaques were found in the lower third of  the esophagus. Brushings for KOH prep were obtained. - The exam of the esophagus was otherwise normal. - Evidence of a Nissen fundoplication was found in the cardia, with a retained suture visible. The wrap was intact. - Patchy inflammation characterized by erythema was found in the gastric antrum. Biopsies were taken with a cold forceps for histology. - The exam of the stomach was otherwise normal. - The duodenal bulb and second portion of the duodenum were normal.   EGD 01/24/2018 - Z-line, 35 cm from the incisors. - Suspect mild esophagitis dissecans - Tortous esophagus with angulated entrance to fundoplication versus mild stenosis - dilation not performed given volume of food in the stomach - A large amount of food (residue) in the stomach, the procedure was aborted.     EGD 12/25/2015 -  Normal exam, fundoplication noted - per Dr. Myrtie Neither Esophageal manometry 08/15/2015 - intact peristalsis, normal LES relaxation, intermittent nonspecific spasm EGD 03/15/2013 - esophageal candidiasis, recurrent 4-5 cm hernia, history of Nissen with Sheria Lang lesion Colonoscopy 04/07/2011 - 2 small adenomatous polyps, internal hemorrhoids,    CT angio abdomen / pelvis - 04/05/2017 - normal CT abdomen / pelvis 03/10/2017 - mild stable CBD dilation 10mm post cholecystectomy CT abdomen pelvis 03/27/2016 - normal abdomen,  Barium swallow 12/24/15 - stricture in distal 3rd of esophagus, nonspecific dysmotility Gastric emptying study 03/2013 - normal    DEXA scan 01/27/22 - T score (-) 2.4 - osteopenia    Past Medical History:  Diagnosis Date   Anxiety    takes Xanax daily as needed   Asthma    Albuterol daily as needed   Blood transfusion without reported diagnosis    CAD (coronary artery disease)    LHC 2003 with 40% pLAD, 30% mLAD, 100% D1 (moderate vessel), 100%  D2 (small vessel), 95% mid small OM2.  Pt had PTCA to D1;  D2 and OM2 small vessels and tx medically;  Last Myoview (2012): anterior infarct  seen with scar, no ischemia. EF normal. Med Rx // Myoview (12/14):  Low risk; ant scar w/ peri-infarct ischemia; EF 55% with ant AK // Myoview 5/16: EF 55-65, ant, apical scar, no ischemia    Cataract    Chronic back pain    HNP, DDD, spinal stenosis, vertebral compression fractures.    Chronic nausea    takes Zofran daily as needed.  normal gastric emptying study 03/2013   CKD (chronic kidney disease), stage III 09/27/2017   Constipation    felt to be functional. takes Senokot and Miralax daily.  treated with Linzess in past.    DIVERTICULOSIS, COLON 08/28/2008   Qualifier: Diagnosis of  By: Bascom Levels, [name], [name]     Elevated LFTs 2015   probably med induced DILI, from Augmentin vs Pravastatin   GERD (gastroesophageal reflux disease)    hx of esophageal stricture    Hiatal hernia    Paraesophageal hernias. s/p fundoplications in 2013 and 04/2013   History of bronchitis several of yrs ago   History of colon polyps 03/2009, 03/2011   adenomatous colon  polyps, no high grade dysplasia.    History of vertigo    no meds   Hyperlipidemia    takes Pravastatin daily   Hypertension    takes Amlodipine,Metoprolol,and Losartan daily   Multiple closed pelvic fractures without disruption of pelvic circle    Nausea 03/2016   Osteoporosis    PONV (postoperative nausea and vomiting)    yrs ago   Pubic ramus fracture 12/20/2019   Slow urinary stream    takes Rapaflo daily   TIA (transient ischemic attack) 1995   no residual effects noted   Vertebral compression fracture 06/23/2012   Past Surgical History:  Procedure Laterality Date   APPENDECTOMY     patient unsure of date   BACK SURGERY     BLADDER REPAIR     CHOLECYSTECTOMY     Patient unsure of date.   CORONARY ANGIOPLASTY  11/16/2001   1 stent   ESOPHAGEAL MANOMETRY N/A 03/25/2013   Procedure: ESOPHAGEAL MANOMETRY (EM);  Surgeon: Louis Meckel, MD;  Location: [facility] ENDOSCOPY;  Service: Endoscopy;  Laterality: N/A;    ESOPHAGOGASTRODUODENOSCOPY N/A 03/15/2013   Procedure: ESOPHAGOGASTRODUODENOSCOPY (EGD);  Surgeon: Beverley Fiedler, MD;  Location[facility] END[facility]COPY;  Service: Endoscopy;  Laterality: N/A;   ESOPHAGOGASTRODUODENOSCOPY N/A 12/25/2015   Procedure: ESOPHAGOGASTRODUODENOSCOPY (EGD);  Surgeon: Sherrilyn Rist, MD;  Location: [facility] ENDOSCOPY;  Service: Endoscopy;  Laterality: N/A;   EYE SURGERY  11/2000   bilateral cataracts with lens implant   FIXATION KYPHOPLASTY LUMBAR SPINE  X 2   "L3-4"   HEMORRHOID SURGERY     HIATAL HERNIA REPAIR  06/03/2011   Procedure: LAPAROSCOPIC REPAIR OF HIATAL HERNIA;  Surgeon: Ernestene Mention, MD;  Location: [facility];  Service: General;  Laterality: N/A;   HIATAL HERNIA REPAIR N/A 04/24/2013   Procedure: LAPAROSCOPIC  REPAIR RECURRENT PARASOPHAGEAL HIATAL HERNIA WITH FUNDOPLICATION;  Surgeon: Ardeth Sportsman, MD;  Location: [facility] ORS;  Service: General;  Laterality: N/A;   INSERTION OF MESH N/A 04/24/2013   Procedure: INSERTION OF MESH;  Surgeon: Ardeth Sportsman, MD;  Location: [facility];  Service: General;  Laterality: N/A;   IR KYPHO THORACIC WITH BONE BIOPSY  09/29/2017   IR KYPHO THORACIC WITH BONE BIOPSY  11/03/2017   IR KYPHO THORACIC WITH BONE BIOPSY  02/14/2019   IR KYPHO THORACIC WITH BONE BIOPSY  04/02/2019   LAPAROSCOPIC LYSIS OF ADHESIONS N/A 04/24/2013   Procedure: LAPAROSCOPIC LYSIS OF ADHESIONS;  Surgeon: Ardeth Sportsman, MD;  Location: [facility];  Service: General;  Laterality: N/A;   LAPAROSCOPIC NISSEN FUNDOPLICATION  06/03/2011   Procedure: LAPAROSCOPIC NISSEN FUNDOPLICATION;  Surgeon: Ernestene Mention, MD;  Location: [facility] OR[facility];  Service: General;  Laterality: N/A;   LUMBAR FUSION  12/07/2015   Right L2-3 facetectomy, posterolateral fusion L2-3 with pedicle screws, rods, local bone graft, Vivigen cancellous chips. Fusion extended to the L1 level with pedicle screws and rods   LUMBAR LAMINECTOMY/DECOMPRESSION MICRODISCECTOMY Right 06/26/2012   Procedure: LUMBAR  LAMINECTOMY/DECOMPRESSION MICRODISCECTOMY;  Surgeon: Kerrin Champagne, MD;  [facility] OR;  Service: Orthopedics;  Laterality: Right;  RIGHT L4-5 MICRODISCECTOMY   LUMBAR LAMINECTOMY/DECOMPRESSION MICRODISCECTOMY N/A 05/29/2015   Procedure: RIGHT L2-3 MICRODISCECTOMY;  Surgeon: Kerrin Champagne, MD;  Location: [facility] OR;  Service: Orthopedics;  Laterality: N/A;   TOTAL ABDOMINAL HYSTERECTOMY  1975   partial   UPPER GASTROINTESTINAL ENDOSCOPY      reports that she has never smoked. She has never used smokeless tobacco. She reports that she does not  drink alcohol and does not use drugs. family history includes Arthritis in her brother; Brain cancer in her brother; Colon cancer (age of onset: 54) in her mother; Heart attack (age of onset: 49) in her father; Heart disease in her brother; Kidney disease in her daughter; Ulcers in her brother. Allergies  Allergen Reactions   Ace Inhibitors Cough    Per Dr Sherene Sires pulmonology 2013   Aspartame Diarrhea and Nausea And Vomiting   Atorvastatin Other (See Comments)    Muscle aches   Biaxin [Clarithromycin] Other (See Comments)    Unknown reaction : PATIENT CAN TOLERATE Z-PAK.  Is written on patient's paper chart.   Daliresp [Roflumilast] Nausea And Vomiting   Erythromycin Other (See Comments)    Unknown reaction: PATIENT CAN TOLERATE Z-PAK.  It is written on patient's paper chart.   Levofloxacin Nausea And Vomiting      Outpatient Encounter Medications as of 08/30/2022  Medication Sig   albuterol (PROVENTIL) (2.5 MG/3ML) 0.083% nebulizer solution Take 3 mLs (2.5 mg total) by nebulization every 4 (four) hours as needed for wheezing or shortness of breath.   albuterol (VENTOLIN HFA) 108 (90 Base) MCG/ACT inhaler INHALE 2 PUFFS BY MOUTH EVERY 4 HOURS AS NEEDED FOR WHEEZING FOR SHORTNESS OF BREATH   ALPRAZolam (XANAX) 0.5 MG tablet Take 1/2 tablet 1- 2 x /day ONLY if needed for Anxiety or Panic Attack &  limit to 5 days /week to avoid Addiction - DO NOT take with  Hydrocodone or Codeine   amLODipine (NORVASC) 10 MG tablet Take   1 tablet  Daily for BP & Heart                                                          /                              TAKE                              BY                             MOUTH                         ONCE DAILY   anti-nausea (EMETROL) solution Take 10 mLs by mouth every 15 (fifteen) minutes as needed for nausea or vomiting.   aspirin EC 81 MG tablet Take 81 mg by mouth at bedtime. Every other night   Budeson-Glycopyrrol-Formoterol (BREZTRI AEROSPHERE) 160-9-4.8 MCG/ACT AERO Inhale 2 puffs into the lungs in the morning and at bedtime.   budesonide (PULMICORT) 0.25 MG/2ML nebulizer solution Take 2 mLs (0.25 mg total) by nebulization 4 (four) times daily.   Cholecalciferol (VITAMIN D3) 5000 units CAPS Take 5,000 Units by mouth 2 (two) times daily.    gabapentin (NEURONTIN) 600 MG tablet Take  1/2 to 1 tablet  3 to 4 x /Daily  as needed for Pain   ketoconazole (NIZORAL) 2 % cream Apply to  Ringworm rash  2 x /day   Magnesium 400 MG TABS Take 400 mg by mouth daily at 2 PM.  melatonin 5 MG TABS Take 5 mg by mouth at bedtime as needed.   metoprolol tartrate (LOPRESSOR) 50 MG tablet Take 1 tablet  2 x /day(every 12 hours)  for BP & Heart                                /                          TAKE                      BY                   MOUTH   Multiple Vitamins-Minerals (MULTIVITAMIN WITH MINERALS) tablet Take 1 tablet by mouth daily.   nitroGLYCERIN (NITROSTAT) 0.4 MG SL tablet Place 1 tablet (0.4 mg total) under the tongue every 5 (five) minutes as needed for chest pain.   promethazine (PHENERGAN) 25 MG tablet TAKE 1 TABLET BY MOUTH 4 TIMES DAILY IF  NEEDED  FOR  NAUSEA  AND  VOMITING.   senna-docusate (SENOKOT-S) 8.6-50 MG tablet Take 2 tablets by mouth 2 (two) times daily.   sucralfate (CARAFATE) 1 g tablet Take 1 g by mouth 4 (four) times daily -  with meals and at bedtime.   trospium (SANCTURA) 20 MG tablet Take 20  mg by mouth 2 (two) times daily.   vitamin B-12 (CYANOCOBALAMIN) 1000 MCG tablet Take 5,000 mcg by mouth daily.   [DISCONTINUED] predniSONE (DELTASONE) 10 MG tablet Take 4 tabs by mouth once daily x4 days, then 3 tabs x4 days, 2 tabs x4 days, 1 tab x4 days and stop.   No facility-administered encounter medications on file as of 08/30/2022.    REVIEW OF SYSTEMS  : All other systems reviewed and negative except where noted in the History of Present Illness.   PHYSICAL EXAM: BP 132/70   Pulse 60   Ht 5\' 3"  (1.6 m)   Wt 148 lb (67.1 kg)   SpO2 96%   BMI 26.22 kg/m  General: Well developed white female in no acute distress Head: Normocephalic and atraumatic Eyes:  Sclerae anicteric, conjunctiva pink. Ears: Normal auditory acuity Lungs: Clear throughout to auscultation; no W/R/R. Heart: Regular rate and rhythm; no M/R/G. Abdomen: Soft, non-distended.  BS present.  Minimal LLQ TTP. Rectal:  Will be done at the time of flex sig. Musculoskeletal: Symmetrical with no gross deformities  Skin: No lesions on visible extremities Extremities: No edema  Neurological: Alert oriented x 4, grossly non-focal Psychological:  Alert and cooperative. Normal mood and affect  ASSESSMENT AND PLAN: *87 year old female with ongoing/longstanding complaints of nausea, was having some diarrhea and left lower quadrant abdominal pain.  CT scan showed some featureless appearance of the descending and sigmoid colon without mural thickening or inflammatory changes, query chronic inflammatory bowel disease.  Stool studies for infectious source were negative.  Diarrhea is better, but still having left lower quadrant abdominal pain and obviously the ongoing nausea. *Ongoing/recurrent/chronic nausea:  history of paraesophageal hernias repaired in the past with fundoplication, chronic reflux. *Personal history of adenomatous colon polyps: Had several removed in January 2019 with repeat recommended in 3 years.  -Will  schedule both EGD and colonoscopy Dr. Adela Lank.  The risks, benefits, and alternatives to EGD and colonoscopy were discussed with the patient and she consents to proceed.  **Of note, she had an  episode of chest pain the other day when she got very upset and anxious.  She believes that is related to that, but she is going to make an appointment with cardiology just to be clear they do not think anything else going on as well.   CC:  Lucky Cowboy, MD

## 2022-08-30 NOTE — Patient Instructions (Addendum)
You have been scheduled for an endoscopy and flexible sigmoidoscopy. Please follow the written instructions given to you at your visit today. If you use inhalers (even only as needed), please bring them with you on the day of your procedure.   Please discuss your recent symptoms with Dr. Anne Fu before your procedure.  Thank you for entrusting me with your care and for choosing [employer], Doug Sou[facility] P[facility] - [facility].   If your blood pressure at your visit was 140/90 or greater, please contact your primary care physician to follow up on this. ______________________________________________________  If you are age 85 or older, your body mass index should be between 23-30. Your Body mass index is 26.22 kg/m. If this is out of the aforementioned range listed, please consider follow up with your Primary Care Provider.  If you are age 41 or younger, your body mass index should be between 19-25. Your Body mass index is 26.22 kg/m. If this is out of the aformentioned range listed, please consider follow up with your Primary Care Provider.  ________________________________________________________  The [facility] providers would like to encourage you to use [facility] to communicate with providers for non-urgent requests or questions.  Due to long hold times on the telephone, sending your provider a message by [facility] may be a faster and more efficient way to get a response.  Please allow 48 business hours for a response.  Please remember that this is for non-urgent requests.  _______________________________________________________  Due to recent changes in healthcare laws, you may see the results of your imaging and laboratory studies on MyChart before your provider has had a chance to review them.  We understand that in some cases there may be results that are confusing or concerning to you. Not all laboratory results come back in the same time frame and the provider may be waiting for multiple results  in order to interpret others.  Please give [place] 48 hours in order for your provider to thoroughly review all the results before contacting the office for clarification of your results.

## 2022-08-31 ENCOUNTER — Telehealth: Payer: Self-pay | Admitting: Gastroenterology

## 2022-08-31 ENCOUNTER — Telehealth: Payer: Self-pay

## 2022-08-31 NOTE — Telephone Encounter (Signed)
I s/w the pt and she asked for a in office with Dr. Anne Fu; see notes from Bernadene Person, NP pre op APP today. Patient indicated she would be calling to schedule an appointment with Dr. Donato Schultz to discuss some symptoms she has experienced that may be related to anxiety, but she/we wants to be sure.    Pt has been scheduled to see Dr. Anne Fu 09/12/22 @ 9:30 to discuss symptoms and pre op clearance needed as well. I will update all parties all involved.

## 2022-08-31 NOTE — Telephone Encounter (Signed)
   Name: [name] [name]  DOB: [birth year]  MRN: [id number]  Primary Cardiologist: Donato Schultz, MD  Chart reviewed as part of pre-operative protocol coverage. Because of [name] [name]'s past medical history and time since last visit, she will require a follow-up in-office visit in order to better assess preoperative cardiovascular risk.Patient indicated she would be calling to schedule an appointment with Dr. Donato Schultz to discuss some symptoms she has experienced that may be related to anxiety, but she/we wants to be sure.   Pre-op covering staff: - Please schedule appointment and call patient to inform them. If patient already had an upcoming appointment within acceptable timeframe, please add "pre-op clearance" to the appointment notes so provider is aware. - Please contact requesting surgeon's office via preferred method (i.e, phone, fax) to inform them of need for appointment prior to surgery.   Joylene Grapes, NP  08/31/2022, 4:23 PM

## 2022-08-31 NOTE — Telephone Encounter (Signed)
Cardiac clearance request sent to [facility].  Procedure 10-10-22 with [facility].

## 2022-08-31 NOTE — Telephone Encounter (Signed)
Inbound call from patient , stated that she saw Doug Sou and following her instructions she called Dr.Skains office to discuss her symptoms before procedure and she was told by receptionist  that a nurse have to call Dr.Skains office to have her medical clearance for upcoming procedure 6/3. Dr.Skains office# [phone]

## 2022-08-31 NOTE — Telephone Encounter (Signed)
[facility] Pre-operative Risk Assessment     Request for surgical clearance:     Endoscopy Procedure  What type of surgery is being performed?    EGD/FLEX SIGMOIDOSCOPY  When is this surgery scheduled?   10-10-22  What type of clearance is required ?  CARDIAC CLEARANCE  Are there any medications that need to be held prior to surgery and how long? NO  Practice name and name of physician performing surgery?  [facility]. [facility]  What is your office phone and fax number?      Phone- [phone]  Fax- [phone] ATTN:  Berlinda Last  Anesthesia type (None, local, MAC, general) ?       MAC   Patient indicated she would be calling to schedule an appointment with Dr. Donato Schultz to discuss some symptoms she has experienced that may be related to anxiety, but she/we wants to be sure.  THANK YOU.

## 2022-09-01 NOTE — Telephone Encounter (Signed)
Okay thanks for the update, we will await Dr. Anne Fu evaluation.  Just, FYI, about the patient you saw a few days ago.  Can you send your note when you are done with it?  Thanks

## 2022-09-02 ENCOUNTER — Encounter: Payer: Self-pay | Admitting: Gastroenterology

## 2022-09-02 DIAGNOSIS — R933 Abnormal findings on diagnostic imaging of other parts of digestive tract: Secondary | ICD-10-CM | POA: Insufficient documentation

## 2022-09-02 DIAGNOSIS — R112 Nausea with vomiting, unspecified: Secondary | ICD-10-CM | POA: Insufficient documentation

## 2022-09-02 DIAGNOSIS — R1032 Left lower quadrant pain: Secondary | ICD-10-CM | POA: Insufficient documentation

## 2022-09-04 NOTE — Progress Notes (Unsigned)
Subjective:   Patient ID: [name] [name], female    DOB: 13-Sep-1935    MRN: [id number]   Brief patient profile:  76  yowf  never smoker with large hiatal hernia & recurrent asthmatic bronchitis , about twice/yr for many yrs. Lung parenchyma looks ok on Ct scans 10/2006 & 12/10. PFTs nml in 12/09 & mild airway obstruction in 08/2009.  First presented 3/09 . CT neck for thyroid imaging s/o mediastinal contour abnormal. Ct chest >> large hiatal hernia & BL upper lobe hazy , ground glass opacity.  Dec, 2010  [name])  Fe def anemia requiring transfusions, small bowel AVMs suspected.  Mar 2011 Acute visit- c/o cough and wheezing >> augmentin helped, enalapril changed to diovan     09/08/2017 1st  office visit/ [name] [name]  Re AB  Chief Complaint  Patient presents with   Pulmonary Consult    Last seen here in July 2015. She states that she is here for just "check up". Breathing is overall doing well.  She rarely uses her albuterol inhaler.   uses dulera 200 when over does it  Sleeping s resp problems  No cough  Walks at [employer] at a slow pace with a cane and not really limited by doe   rec In the event of cough/ wheeze or worse short of breath > dulera  100 or 200 up to 2 pffs every 12 hours Work on inhaler technique:   Plan B = Backup Only use your albuterol as a rescue medication Plan C = Crisis - only use your albuterol nebulizer if you first try Plan B and it fails to help > ok to use the nebulizer up to every 4 hours but if start needing it regularly call for immediate appointment        03/22/2022  f/u ov/[name] [name] re: asthma/gerd   maint on symbicort 160   Chief Complaint  Patient presents with   Follow-up    Cough is some better but has not resolved. She has prod cough with green sputum.    Dyspnea:  baseline  Cough: was much better p augmentin and pred then gradually worse again  Sleeping: on bed blocks s noct awakening but then cough present w/in a few min of stirring in am  SABA use:  rarely uses 02: none  Rec Plan A = Automatic = Always=    Symbicort 160  Take 2 puffs first thing in am and then another 2 puffs about 12 hours later.  Plan B = Backup (to supplement plan A, not to replace it) Only use your albuterol inhaler as a rescue medication  Plan C = Crisis (instead of Plan B but only if Plan B stops working) - only use your albuterol nebulizer if you first try Plan B  Prednisone 10 mg take  4 each am x 2 days,   2 each am x 2 days,  1 each am x 2 days and stop  Augmentin 875 mg take one pill twice daily  X 14 days  Please schedule a follow up office visit in 6 weeks, call sooner if needed bring inhaler/spacer /solutions with you including over the counter medications other than vitamins     05/16/2022  f/u ov/[name] [name] re: AB/gerd flare since fall 2023  maint on Symbicort 160/ ppi bid   Chief Complaint  Patient presents with   Follow-up    Pt fell 03/20/2022.  Pt feels like she bruised rib area. No fracture.  Rib area painful with deep breaths.  Dyspnea:  [employer] walking fine  Cough: no am flare/worse if overdoes it  / mucoid  Sleeping: sleeping on bed block/ SABA use: not much  02: none  Covid status:   vax max New L cp p fell in November 2023 but gradually improving/ worse with cough  Rec Prednisone 10 mg take  4 each am x 2 days,   2 each am x 2 days,  1 each am x 2 days and stop  For cough / congestion > stop robitussin and start mucinex dm 1200 mg every 12 hours as needed Plan A = Automatic = Always=   Symbicort 160 Take 2 puffs first thing in am and then another 2 puffs about 12 hours later.  Work on inhaler technique:  Plan B = Backup (to supplement plan A, not to replace it) Only use your albuterol inhaler as a rescue medication Plan C = Crisis (instead of Plan B but only if Plan B stops working) - only use your albuterol nebulizer if you first try Plan B Please schedule a follow up office visit in 6 weeks, call sooner if needed - bring your symbicort and  spacer     07/12/2022  f/u ov/[name] [name] re: AB/gerd   maint on ppi bid / no ICS (can't afford )   Chief Complaint  Patient presents with   Follow-up    Breathing has been worse over the past wk- started after cleaning a dusty room, she was unable to get the [facility] due to cost, using albuterol inhaler 3-4 x per day. She has had prod cough with green/yellow, thick sputum and has noticed some wheezing.   Dyspnea:  [employer] walking still  Cough: worst 1st thing in am turing thicker and slt green  Sleeping: on bed blocks  SABA use: rarely  02: none  Rec Prednisone 10 mg take  4 each am x 2 days,   2 each am x 2 days,  1 each am x 2 days and stop  Mucinex dm 1200 mg every 12 hours as needed Plan A = Automatic = Always=   albuterol nebulizer with budesonide 0.25 twice daily and if getting worse increase to 4 daily  Plan B = Backup (to supplement plan A, not to replace it) Only use your albuterol inhaler as a rescue medication   08/01/22  Icard rec breztri   09/05/2022  f/u ov/[name] [name] re: ***   maint on ***  No chief complaint on file.   Dyspnea:  *** Cough: *** Sleeping: *** SABA use: *** 02: *** Covid status:   *** Lung cancer screening :  ***    No obvious day to day or daytime variability or assoc excess/ purulent sputum or mucus plugs or hemoptysis or cp or chest tightness, subjective wheeze or overt sinus or hb symptoms.   *** without nocturnal  or early am exacerbation  of respiratory  c/o's or need for noct saba. Also denies any obvious fluctuation of symptoms with weather or environmental changes or other aggravating or alleviating factors except as outlined above   No unusual exposure hx or h/o childhood pna/ asthma or knowledge of premature birth.  Current Allergies, Complete Past Medical History, Past Surgical History, Family History, and Social History were reviewed in [employer] record.  ROS  The following are not active complaints unless  bolded Hoarseness, sore throat, dysphagia, dental problems, itching, sneezing,  nasal congestion or discharge of excess mucus or purulent secretions, ear ache,   fever, chills,  sweats, unintended wt loss or wt gain, classically pleuritic or exertional cp,  orthopnea pnd or arm/hand swelling  or leg swelling, presyncope, palpitations, abdominal pain, anorexia, nausea, vomiting, diarrhea  or change in bowel habits or change in bladder habits, change in stools or change in urine, dysuria, hematuria,  rash, arthralgias, visual complaints, headache, numbness, weakness or ataxia or problems with walking or coordination,  change in mood or  memory.        No outpatient medications have been marked as taking for the 09/05/22 encounter (Appointment) with Nyoka Cowden, MD.                    Objective:   Physical Exam  Wts    09/05/2022         ***  07/12/2022          143  05/16/2022          142  03/22/2022       144   02/25/2022      147  01/28/2022        148 12/14/2021          144   05/12/2021          143  10/14/2020          150 09/02/2020        152   09/08/2017         164   11/19/13 132 lb (59.875 kg)  11/14/13 127 lb (57.607 kg)  11/05/13 125 lb (56.7 kg)     Vital signs reviewed  09/05/2022  - Note at rest 02 sats  ***% on ***   General appearance:    ***        Min bar***        Assessment & Plan:

## 2022-09-05 ENCOUNTER — Encounter: Payer: Self-pay | Admitting: Gastroenterology

## 2022-09-05 ENCOUNTER — Ambulatory Visit: Payer: PPO | Admitting: Internal Medicine

## 2022-09-05 ENCOUNTER — Other Ambulatory Visit (HOSPITAL_COMMUNITY): Payer: Self-pay

## 2022-09-05 ENCOUNTER — Encounter: Payer: Self-pay | Admitting: Internal Medicine

## 2022-09-05 ENCOUNTER — Telehealth: Payer: Self-pay | Admitting: *Deleted

## 2022-09-05 ENCOUNTER — Telehealth: Payer: Self-pay

## 2022-09-05 VITALS — BP 116/80 | HR 63 | Temp 97.9°F | Ht 63.0 in | Wt 149.0 lb

## 2022-09-05 DIAGNOSIS — J4520 Mild intermittent asthma, uncomplicated: Secondary | ICD-10-CM | POA: Diagnosis not present

## 2022-09-05 MED ORDER — BUDESONIDE-FORMOTEROL FUMARATE 160-4.5 MCG/ACT IN AERO
INHALATION_SPRAY | RESPIRATORY_TRACT | 12 refills | Status: DC
Start: 2022-09-05 — End: 2022-09-08

## 2022-09-05 MED ORDER — PREDNISONE 10 MG PO TABS
ORAL_TABLET | ORAL | 11 refills | Status: DC
Start: 2022-09-05 — End: 2022-10-31

## 2022-09-05 NOTE — Telephone Encounter (Addendum)
-----   Message from Leta Baptist, PA-C sent at 09/05/2022  1:35 PM EDT ----- We scheduled her for an EGD and flex sig with Dr. Adela Lank when I saw her last week, we talked about doing a full colonoscopy and she said she preferred just to do the partial but now as I am finishing her chart further she is actually overdue for colonoscopy for history of polyps anyways. If she feels like she could tolerate the prep then I would prefer we do a full colonoscopy.  Thank you!

## 2022-09-05 NOTE — Telephone Encounter (Signed)
Patient states she would like to keep things the same. She does not want to do the colonoscopy due to her age.

## 2022-09-05 NOTE — Telephone Encounter (Signed)
PA request received via CMM for Breyna 160-4.5MCG/ACT aerosol  PA has not been submitted due to alternative previously prescribed Dulera being covered for $47.00 per test claim at this time and nothing noted as patient "failing"  Key: [id number]

## 2022-09-05 NOTE — Progress Notes (Signed)
Agree with assessment and plan as outlined. Looks like she is scheduled for flex sig, which is reasonable to evaluate this issue, as well as EGD.

## 2022-09-05 NOTE — Patient Instructions (Addendum)
Plan A = Automatic = Always=    Symbicort 160 Take 2 puffs first thing in am and then another 2 puffs about 12 hours later or budesonide with albuterol twice daily     Work on inhaler technique:  relax and gently blow all the way out then take a nice smooth full deep breath back in, triggering the inhaler at same time you start breathing in.  Hold breath in for at least  5 seconds if you can. Blow out symbicort  thru nose. Rinse and gargle with water when done.  If mouth or throat bother you at all,  try brushing teeth/gums/tongue with arm and hammer toothpaste/ make a slurry and gargle and spit out.   >>>  Remember how golfers warm up by taking practice swings - do this with an empty inhaler       Plan B = Backup (to supplement plan A, not to replace it) Only use your albuterol inhaler as a rescue medication to be used if you can't catch your breath by resting or doing a relaxed purse lip breathing pattern.  - The less you use it, the better it will work when you need it. - Ok to use the inhaler up to 2 puffs  every 4 hours if you must but call for appointment if use goes up over your usual need - Don't leave home without it !!  (think of it like the spare tire for your car)    Plan C = Crisis (instead of Plan B but only if Plan B stops working) - only use your albuterol nebulizer if you first try Plan B and it fails to help > ok to use the nebulizer up to every 4 hours but if start needing it regularly call for immediate appointment  Plan D = Deltasone = prednisone Refillable:  Prednisone 10 mg take  4 each am x 2 days,   2 each am x 2 days,  1 each am x 2 days and stop    Please schedule a follow up visit in 3 months but call sooner if needed  with all respiratory medications /inhalers/ solutions in hand so we can verify exactly what you are taking. This includes all medications from all doctors and over the counters

## 2022-09-08 MED ORDER — MOMETASONE FURO-FORMOTEROL FUM 200-5 MCG/ACT IN AERO: INHALATION_SPRAY | RESPIRATORY_TRACT | 5 refills | Status: DC

## 2022-09-08 NOTE — Telephone Encounter (Signed)
ATC patient x1.  Reached busy signal. Called again and was able to speak with patient.  Provided information regarding inhaler per pharmacy team and Dr. Sherene Sires.  She verbalized understanding.  Verified pharmacy and script sent.  Nothing further needed.

## 2022-09-08 NOTE — Telephone Encounter (Signed)
Should be on dulera 200 then = Take 2 puffs first thing in am and then another 2 puffs about 12 hours later.

## 2022-09-12 ENCOUNTER — Encounter: Payer: Self-pay | Admitting: *Deleted

## 2022-09-12 ENCOUNTER — Ambulatory Visit: Payer: PPO | Attending: Cardiology | Admitting: Cardiology

## 2022-09-12 ENCOUNTER — Encounter: Payer: Self-pay | Admitting: Cardiology

## 2022-09-12 VITALS — BP 131/77 | HR 61 | Ht 63.0 in | Wt 147.0 lb

## 2022-09-12 DIAGNOSIS — I2510 Atherosclerotic heart disease of native coronary artery without angina pectoris: Secondary | ICD-10-CM

## 2022-09-12 DIAGNOSIS — R072 Precordial pain: Secondary | ICD-10-CM | POA: Diagnosis not present

## 2022-09-12 DIAGNOSIS — I700 Atherosclerosis of aorta: Secondary | ICD-10-CM | POA: Diagnosis not present

## 2022-09-12 NOTE — Telephone Encounter (Signed)
Called and spoke to patient. We rescheduled her procedure for Wed, 10-19-22 at 3:30pm to allow enough time to get results back from Cardiac workup. Mailing new instructions to patient for 6-12 appt

## 2022-09-12 NOTE — Telephone Encounter (Signed)
-----   Message from Leta Baptist, PA-C sent at 09/12/2022  2:25 PM EDT ----- Regarding: RE: cardiology appt Let's rescheduled to 6/12 to be sure we have the ECHO/stress test back first.  ----- Message ----- From: Cooper Render, CMA Sent: 09/12/2022   1:46 PM EDT To: Leta Baptist, PA-C Subject: cardiology appt                                Please review Cardiology appointment from this am.  Patient is scheduled to have EGD/ Flex with [facility] on 6-3.  They scheduled an ECHO on 5-31 which probably won't have resulted in time. Please advise. He has an opening on 6-12... ?  Thanks, Jan

## 2022-09-12 NOTE — Patient Instructions (Signed)
Medication Instructions:  The current medical regimen is effective;  continue present plan and medications.  *If you need a refill on your cardiac medications before your next appointment, please call your pharmacy*  Testing/Procedures: Your physician has requested that you have an echocardiogram. Echocardiography is a painless test that uses sound waves to create images of your heart. It provides your doctor with information about the size and shape of your heart and how well your heart's chambers and valves are working. This procedure takes approximately one hour. There are no restrictions for this procedure. Please do NOT wear cologne, perfume, aftershave, or lotions (deodorant is allowed). Please arrive 15 minutes prior to your appointment time.  Your physician has requested that you have a lexiscan myoview. For further information please visit [email or web address] Please follow instruction sheet, as given.  Follow-Up: At [facility], you and your health needs are our priority.  As part of our continuing mission to provide you with exceptional heart care, we have created designated Provider Care Teams.  These Care Teams include your primary Cardiologist (physician) and Advanced Practice Providers (APPs -  Physician Assistants and Nurse Practitioners) who all work together to provide you with the care you need, when you need it.  We recommend signing up for the patient portal called "MyChart".  Sign up information is provided on this After Visit Summary.  MyChart is used to connect with patients for Virtual Visits (Telemedicine).  Patients are able to view lab/test results, encounter notes, upcoming appointments, etc.  Non-urgent messages can be sent to your provider as well.   To learn more about what you can do with MyChart, go to [email or web address].    Your next appointment:   1 year(s)  Provider:   Donato Schultz, MD

## 2022-09-12 NOTE — Progress Notes (Signed)
Cardiology Office Note:    Date:  09/12/2022   ID:  [name], DOB [birth year], MRN [id number]  PCP:  Lucky Cowboy, MD   [facility] Providers Cardiologist:  Donato Schultz, MD     Referring MD: Lucky Cowboy, MD    History of Present Illness:    [name] [name] is a 87 y.o. female here for follow-up coronary artery disease, last seen on 05/21/2019, greater than 3 years ago.  Has some "hurting in left arm and shoulder".  In review prior pulmonary note, she has had some shortness of breath and occasional chest tightness.  At 1 point was placed on a prednisone taper  Dr. Adela Lank would like to perform EGD as well as lower endoscopy.  Has a prior history of paraesophageal hernias with fundoplication chronic reflux dysphagia.  Remote history of TIA.   Past Medical History:  Diagnosis Date   Anxiety    takes Xanax daily as needed   Asthma    Albuterol daily as needed   Blood transfusion without reported diagnosis    CAD (coronary artery disease)    LHC 2003 with 40% pLAD, 30% mLAD, 100% D1 (moderate vessel), 100%  D2 (small vessel), 95% mid small OM2.  Pt had PTCA to D1;  D2 and OM2 small vessels and tx medically;  Last Myoview (2012): anterior infarct seen with scar, no ischemia. EF normal. Med Rx // Myoview (12/14):  Low risk; ant scar w/ peri-infarct ischemia; EF 55% with ant AK // Myoview 5/16: EF 55-65, ant, apical scar, no ischemia    Cataract    Chronic back pain    HNP, DDD, spinal stenosis, vertebral compression fractures.    Chronic nausea    takes Zofran daily as needed.  normal gastric emptying study 03/2013   CKD (chronic kidney disease), stage III (HCC) 09/27/2017   Constipation    felt to be functional. takes Senokot and Miralax daily.  treated with Linzess in past.    DIVERTICULOSIS, COLON 08/28/2008   Qualifier: Diagnosis of  By: Bascom Levels, [name], [name]     Elevated LFTs 2015   probably med induced DILI, from Augmentin vs Pravastatin   GERD  (gastroesophageal reflux disease)    hx of esophageal stricture    Hiatal hernia    Paraesophageal hernias. s/p fundoplications in 2013 and 04/2013   History of bronchitis several of yrs ago   History of colon polyps 03/2009, 03/2011   adenomatous colon polyps, no high grade dysplasia.    History of vertigo    no meds   Hyperlipidemia    takes Pravastatin daily   Hypertension    takes Amlodipine,Metoprolol,and Losartan daily   Multiple closed pelvic fractures without disruption of pelvic circle (HCC)    Nausea 03/2016   Osteoporosis    PONV (postoperative nausea and vomiting)    yrs ago   Pubic ramus fracture (HCC) 12/20/2019   Slow urinary stream    takes Rapaflo daily   TIA (transient ischemic attack) 1995   no residual effects noted   Vertebral compression fracture (HCC) 06/23/2012    Past Surgical History:  Procedure Laterality Date   APPENDECTOMY     patient unsure of date   BACK SURGERY     BLADDER REPAIR     CHOLECYSTECTOMY     Patient unsure of date.   CORONARY ANGIOPLASTY  11/16/2001   1 stent   ESOPHAGEAL MANOMETRY N/A 03/25/2013   Procedure: ESOPHAGEAL MANOMETRY (EM);  Surgeon: Louis Meckel, MD;  Location: [facility];  Service: Endoscopy;  Laterality: N/A;   ESOPHAGOGASTRODUODENOSCOPY N/A 03/15/2013   Procedure: ESOPHAGOGASTRODUODENOSCOPY (EGD);  Surgeon: Beverley Fiedler, MD;  Location: [facility] ENDOSCOPY;  Service: Endoscopy;  Laterality: N/A;   ESOPHAGOGASTRODUODENOSCOPY N/A 12/25/2015   Procedure: ESOPHAGOGASTRODUODENOSCOPY (EGD);  Surgeon: Sherrilyn Rist, MD;  Location: [facility] ENDOSCOPY;  Service: Endoscopy;  Laterality: N/A;   EYE SURGERY  11/2000   bilateral cataracts with lens implant   FIXATION KYPHOPLASTY LUMBAR SPINE  X 2   "L3-4"   HEMORRHOID SURGERY     HIATAL HERNIA REPAIR  06/03/2011   Procedure: LAPAROSCOPIC REPAIR OF HIATAL HERNIA;  Surgeon: Ernestene Mention, MD;  Location: [facility] ORS;  Service: General;  Laterality: N/A;   HIATAL HERNIA REPAIR N/A  04/24/2013   Procedure: LAPAROSCOPIC  REPAIR RECURRENT PARASOPHAGEAL HIATAL HERNIA WITH FUNDOPLICATION;  Surgeon: Ardeth Sportsman, MD;  Location: [facility] ORS;  Service: General;  Laterality: N/A;   INSERTION OF MESH N/A 04/24/2013   Procedure: INSERTION OF MESH;  Surgeon: Ardeth Sportsman, MD;  Location: [facility] ORS;  Service: General;  Laterality: N/A;   IR KYPHO THORACIC WITH BONE BIOPSY  09/29/2017   IR KYPHO THORACIC WITH BONE BIOPSY  11/03/2017   IR KYPHO THORACIC WITH BONE BIOPSY  02/14/2019   IR KYPHO THORACIC WITH BONE BIOPSY  04/02/2019   LAPAROSCOPIC LYSIS OF ADHESIONS N/A 04/24/2013   Procedure: LAPAROSCOPIC LYSIS OF ADHESIONS;  Surgeon: Ardeth Sportsman, MD;  Location: [facility];  Service: General;  Laterality: N/A;   LAPAROSCOPIC NISSEN FUNDOPLICATION  06/03/2011   Procedure: LAPAROSCOPIC NISSEN FUNDOPLICATION;  Surgeon: Ernestene Mention, MD;  Location: [facility] OR[facility];  Service: General;  Laterality: N/A;   LUMBAR FUSION  12/07/2015   Right L2-3 facetectomy, posterolateral fusion L2-3 with pedicle screws, rods, local bone graft, Vivigen cancellous chips. Fusion extended to the L1 level with pedicle screws and rods   LUMBAR LAMINECTOMY/DECOMPRESSION MICRODISCECTOMY Right 06/26/2012   Procedure: LUMBAR LAMINECTOMY/DECOMPRESSION MICRODISCECTOMY;  Surgeon: Kerrin Champagne, MD;  Location: [facility] OR;  Service: Orthopedics;  Laterality: Right;  RIGHT L4-5 MICRODISCECTOMY   LUMBAR LAMINECTOMY/DECOMPRESSION MICRODISCECTOMY N/A 05/29/2015   Procedure: RIGHT L2-3 MICRODISCECTOMY;  Surgeon: Kerrin Champagne, MD;  Location: [facility] OR;  Service: Orthopedics;  Laterality: N/A;   TOTAL ABDOMINAL HYSTERECTOMY  1975   partial   UPPER GASTROINTESTINAL ENDOSCOPY      Current Medications: Current Meds  Medication Sig   albuterol (PROVENTIL) (2.5 MG/3ML) 0.083% nebulizer solution Take 3 mLs (2.5 mg total) by nebulization every 4 (four) hours as needed for wheezing or shortness of breath.   albuterol (VENTOLIN HFA) 108 (90 Base) MCG/ACT  inhaler INHALE 2 PUFFS BY MOUTH EVERY 4 HOURS AS NEEDED FOR WHEEZING FOR SHORTNESS OF BREATH   ALPRAZolam (XANAX) 0.5 MG tablet Take 1/2 tablet 1- 2 x /day ONLY if needed for Anxiety or Panic Attack &  limit to 5 days /week to avoid Addiction - DO NOT take with Hydrocodone or Codeine   amLODipine (NORVASC) 10 MG tablet Take   1 tablet  Daily for BP & Heart                                                          /  TAKE                              BY                             MOUTH                         ONCE DAILY   anti-nausea (EMETROL) solution Take 10 mLs by mouth every 15 (fifteen) minutes as needed for nausea or vomiting.   aspirin EC 81 MG tablet Take 81 mg by mouth at bedtime. Every other night   budesonide (PULMICORT) 0.25 MG/2ML nebulizer solution Take 2 mLs (0.25 mg total) by nebulization 4 (four) times daily.   Cholecalciferol (VITAMIN D3) 5000 units CAPS Take 5,000 Units by mouth 2 (two) times daily.    gabapentin (NEURONTIN) 600 MG tablet Take  1/2 to 1 tablet  3 to 4 x /Daily  as needed for Pain   ketoconazole (NIZORAL) 2 % cream Apply to  Ringworm rash  2 x /day   Magnesium 400 MG TABS Take 400 mg by mouth daily at 2 PM.   melatonin 5 MG TABS Take 5 mg by mouth at bedtime as needed.   metoprolol tartrate (LOPRESSOR) 50 MG tablet Take 1 tablet  2 x /day(every 12 hours)  for BP & Heart                                /                          TAKE                      BY                   MOUTH   mometasone-formoterol (DULERA) 200-5 MCG/ACT AERO Take 2 puffs first thing in am and then another 2 puffs about 12 hours later.   Multiple Vitamins-Minerals (MULTIVITAMIN WITH MINERALS) tablet Take 1 tablet by mouth daily.   nitroGLYCERIN (NITROSTAT) 0.4 MG SL tablet Place 1 tablet (0.4 mg total) under the tongue every 5 (five) minutes as needed for chest pain.   predniSONE (DELTASONE) 10 MG tablet Take  4 each am x 2 days,   2 each am x 2 days,  1 each am x 2  days and stop   promethazine (PHENERGAN) 25 MG tablet TAKE 1 TABLET BY MOUTH 4 TIMES DAILY IF  NEEDED  FOR  NAUSEA  AND  VOMITING.   senna-docusate (SENOKOT-S) 8.6-50 MG tablet Take 2 tablets by mouth 2 (two) times daily.   solifenacin (VESICARE) 5 MG tablet Take 5 mg by mouth daily.   sucralfate (CARAFATE) 1 g tablet Take 1 g by mouth 4 (four) times daily -  with meals and at bedtime.   trospium (SANCTURA) 20 MG tablet Take 20 mg by mouth 2 (two) times daily.   vitamin B-12 (CYANOCOBALAMIN) 1000 MCG tablet Take 5,000 mcg by mouth daily.     Allergies:   Ace inhibitors, Aspartame, Atorvastatin, Biaxin [clarithromycin], Daliresp [roflumilast], Erythromycin, and Levofloxacin   Social History   Socioeconomic History   Marital status: Married    Spouse name: [name]   Number of children: 2  Years of education: 53   Highest education level: Not on file  Occupational History   Occupation: Retired    Associate Professor: RETIRED    Comment: worked at [employer]  Tobacco Use   Smoking status: Never   Smokeless tobacco: Never  Vaping Use   Vaping Use: Never used  Substance and Sexual Activity   Alcohol use: No   Drug use: Never   Sexual activity: Not Currently    Partners: Male    Birth control/protection: Post-menopausal, Surgical  Other Topics Concern   Not on file  Social History Narrative   Lives with husband.   Right-handed.   3 cups caffeine per day.   Social Determinants of Health   Financial Resource Strain: Not on file  Food Insecurity: Not on file  Transportation Needs: No Transportation Needs (03/17/2021)   PRAPARE - Administrator, Civil Service (Medical): No    Lack of Transportation (Non-Medical): No  Physical Activity: Not on file  Stress: Not on file  Social Connections: Not on file     Family History: The patient's family history includes Arthritis in her brother; Brain cancer in her brother; Colon cancer (age of onset: 27) in her mother; Heart  attack (age of onset: 107) in her father; Heart disease in her brother; Kidney disease in her daughter; Ulcers in her brother. There is no history of Esophageal cancer or Stomach cancer.  ROS:   Please see the history of present illness.    No syncope, no bleeding all other systems reviewed and are negative.  EKGs/Labs/Other Studies Reviewed:    The following studies were reviewed today:   EKG: 09/12/2022-normal sinus rhythm 61 with septal infarct pattern  Recent Labs: 05/23/2022: Magnesium 1.9; TSH 0.29 08/12/2022: ALT 34; BUN 26; Creatinine, Ser 0.89; Hemoglobin 14.9; Platelets 392.0; Potassium 4.1; Sodium 139  Recent Lipid Panel    Component Value Date/Time   CHOL 128 07/22/2022 1043   TRIG 147 07/22/2022 1043   HDL 59 07/22/2022 1043   CHOLHDL 2.2 07/22/2022 1043   VLDL 20 09/30/2016 1025   LDLCALC 46 07/22/2022 1043                     Physical Exam:    VS:  BP 131/77 (BP Location: Left Arm, Patient Position: Sitting, Cuff Size: Normal)   Pulse 61   Ht 5\' 3"  (1.6 m)   Wt 147 lb (66.7 kg)   BMI 26.04 kg/m     Wt Readings from Last 3 Encounters:  09/12/22 147 lb (66.7 kg)  09/05/22 149 lb (67.6 kg)  08/30/22 148 lb (67.1 kg)     GEN:  Well nourished, well developed in no acute distress HEENT: Normal NECK: No JVD; No carotid bruits LYMPHATICS: No lymphadenopathy CARDIAC: RRR, no murmurs, rubs, gallops RESPIRATORY:  Clear to auscultation without rales, wheezing or rhonchi  ABDOMEN: Soft, non-tender, non-distended MUSCULOSKELETAL:  No edema; No deformity  SKIN: Warm and dry NEUROLOGIC:  Alert and oriented x 3 PSYCHIATRIC:  Normal affect   ASSESSMENT:    1. Precordial pain   2. Coronary artery disease involving native coronary artery of native heart without angina pectoris   3. Atherosclerosis of aorta (HCC) by Lumbar CYT scan 01/29/2021    PLAN:    In order of problems listed above:  Chest tightness/shortness of breath Coronary artery disease -  Diagonal PTCA in 2003 with old scar noted on nuclear stress test in 2016 with no ischemia.  We will  go ahead and repeat echocardiogram as well as Lexiscan stress test.  Want to ensure that she does not have any high risk features prior to her endoscopy.  Primary hypertension - Well-controlled on current medications  Aortic atherosclerosis - Continue with secondary prevention efforts statin  Hyperlipidemia - Goal LDL less than 70.  At last check from outside labs LDL 46, hemoglobin A1c 6.1 creatinine 0.9  Follow-up in 1 year.        Shared Decision Making/Informed Consent The risks [chest pain, shortness of breath, cardiac arrhythmias, dizziness, blood pressure fluctuations, myocardial infarction, stroke/transient ischemic attack, nausea, vomiting, allergic reaction, radiation exposure, metallic taste sensation and life-threatening complications (estimated to be 1 in 10,000)], benefits (risk stratification, diagnosing coronary artery disease, treatment guidance) and alternatives of a nuclear stress test were discussed in detail with Ms. Seeling and she agrees to proceed.    Medication Adjustments/Labs and Tests Ordered: Current medicines are reviewed at length with the patient today.  Concerns regarding medicines are outlined above.  Orders Placed This Encounter  Procedures   Cardiac Stress Test: Informed Consent Details: Physician/Practitioner Attestation; Transcribe to consent form and obtain patient signature   MYOCARDIAL PERFUSION IMAGING   EKG 12-Lead   ECHOCARDIOGRAM COMPLETE   No orders of the defined types were placed in this encounter.   Patient Instructions  Medication Instructions:  The current medical regimen is effective;  continue present plan and medications.  *If you need a refill on your cardiac medications before your next appointment, please call your pharmacy*  Testing/Procedures: Your physician has requested that you have an echocardiogram. Echocardiography is  a painless test that uses sound waves to create images of your heart. It provides your doctor with information about the size and shape of your heart and how well your heart's chambers and valves are working. This procedure takes approximately one hour. There are no restrictions for this procedure. Please do NOT wear cologne, perfume, aftershave, or lotions (deodorant is allowed). Please arrive 15 minutes prior to your appointment time.  Your physician has requested that you have a lexiscan myoview. For further information please visit [email or web address] Please follow instruction sheet, as given.  Follow-Up: At [facility], you and your health needs are our priority.  As part of our continuing mission to provide you with exceptional heart care, we have created designated Provider Care Teams.  These Care Teams include your primary Cardiologist (physician) and Advanced Practice Providers (APPs -  Physician Assistants and Nurse Practitioners) who all work together to provide you with the care you need, when you need it.  We recommend signing up for the patient portal called "MyChart".  Sign up information is provided on this After Visit Summary.  MyChart is used to connect with patients for Virtual Visits (Telemedicine).  Patients are able to view lab/test results, encounter notes, upcoming appointments, etc.  Non-urgent messages can be sent to your provider as well.   To learn more about what you can do with MyChart, go to [email or web address].    Your next appointment:   1 year(s)  Provider:   Donato Schultz, MD        Signed, Donato Schultz, MD  09/12/2022 11:50 AM    [facility]

## 2022-09-13 ENCOUNTER — Other Ambulatory Visit (HOSPITAL_COMMUNITY): Payer: Self-pay

## 2022-09-15 ENCOUNTER — Telehealth (HOSPITAL_COMMUNITY): Payer: Self-pay | Admitting: *Deleted

## 2022-09-15 NOTE — Telephone Encounter (Signed)
Left a detailed message with instructions per DPR regarding her scheduled STRESS TEST ON 09/16/22 at 10:00. I also left a call back number should she have any questions.

## 2022-09-16 ENCOUNTER — Ambulatory Visit (HOSPITAL_COMMUNITY): Payer: PPO | Attending: Cardiology

## 2022-09-16 DIAGNOSIS — R072 Precordial pain: Secondary | ICD-10-CM | POA: Diagnosis not present

## 2022-09-16 LAB — MYOCARDIAL PERFUSION IMAGING
Base ST Depression (mm): 0 mm
LV dias vol: 73 mL (ref 46–106)
LV sys vol: 27 mL
Nuc Stress EF: 63 %
Peak HR: 83 {beats}/min
Rest HR: 57 {beats}/min
Rest Nuclear Isotope Dose: 10.1 mCi
SDS: 2
SRS: 12
SSS: 14
ST Depression (mm): 0 mm
Stress Nuclear Isotope Dose: 30.7 mCi
TID: 1.05

## 2022-09-16 MED ORDER — REGADENOSON 0.4 MG/5ML IV SOLN
0.4000 mg | Freq: Once | INTRAVENOUS | Status: AC
Start: 2022-09-16 — End: 2022-09-16
  Administered 2022-09-16: 0.4 mg via INTRAVENOUS

## 2022-09-16 MED ORDER — TECHNETIUM TC 99M TETROFOSMIN IV KIT
30.7000 | PACK | Freq: Once | INTRAVENOUS | Status: AC | PRN
  Administered 2022-09-16: 30.7 via INTRAVENOUS

## 2022-09-16 MED ORDER — TECHNETIUM TC 99M TETROFOSMIN IV KIT
10.1000 | PACK | Freq: Once | INTRAVENOUS | Status: AC | PRN
  Administered 2022-09-16: 10.1 via INTRAVENOUS

## 2022-09-19 NOTE — Telephone Encounter (Signed)
Spoke with patient to advise of stress test results. Patient verbalized understanding and had no questions.

## 2022-09-19 NOTE — Telephone Encounter (Signed)
-----   Message from Jake Bathe, MD sent at 09/19/2022  6:52 AM EDT ----- Stable nuc stress test. She may proceed with endoscopy with Dr. Secundino Ginger, MD

## 2022-09-20 NOTE — Telephone Encounter (Signed)
See result note from 5-10 GATED SPECT MYO PERF W/LEXISCAN STRESS. Per Dr. Anne Fu, Stable nuc stress test. patient may proceed with endoscopy.

## 2022-10-07 ENCOUNTER — Ambulatory Visit (HOSPITAL_COMMUNITY): Payer: PPO | Attending: Cardiology

## 2022-10-07 ENCOUNTER — Telehealth: Payer: Self-pay

## 2022-10-07 DIAGNOSIS — I2510 Atherosclerotic heart disease of native coronary artery without angina pectoris: Secondary | ICD-10-CM | POA: Diagnosis not present

## 2022-10-07 DIAGNOSIS — R072 Precordial pain: Secondary | ICD-10-CM | POA: Insufficient documentation

## 2022-10-07 LAB — ECHOCARDIOGRAM COMPLETE
Area-P 1/2: 3.13 cm2
S' Lateral: 2.8 cm

## 2022-10-07 NOTE — Telephone Encounter (Signed)
-----   Message from Jake Bathe, MD sent at 10/07/2022  5:14 PM EDT ----- Normal pump function.  Overall reassuring echocardiogram. Donato Schultz, MD

## 2022-10-07 NOTE — Telephone Encounter (Signed)
Reviewed Echo results with patient, who verbalizes understanding of normal pump function.

## 2022-10-09 ENCOUNTER — Other Ambulatory Visit: Payer: Self-pay | Admitting: Internal Medicine

## 2022-10-09 MED ORDER — BUSPIRONE HCL 10 MG PO TABS: ORAL_TABLET | ORAL | 0 refills | Status: DC

## 2022-10-10 ENCOUNTER — Telehealth: Payer: Self-pay

## 2022-10-10 ENCOUNTER — Encounter: Payer: PPO | Admitting: Gastroenterology

## 2022-10-10 NOTE — Telephone Encounter (Signed)
Per Dr. Anne Fu:  Normal pump function.  Overall reassuring echocardiogram. EF of 55-60%

## 2022-10-10 NOTE — Telephone Encounter (Signed)
-----   Message from Cooper Render, CMA sent at 09/12/2022  4:12 PM EDT ----- Regarding: check on results of ECHO ECHO on 5-31.  EGD/Flex Sig in 6-12

## 2022-10-12 NOTE — Progress Notes (Unsigned)
Assessment and Plan:  There are no diagnoses linked to this encounter.    Further disposition pending results of labs. Discussed med's effects and SE's.   Over 30 minutes of exam, counseling, chart review, and critical decision making was performed.   Future Appointments  Date Time Provider Department Center  10/13/2022  9:30 AM Raynelle Dick, NP [facility] None  10/19/2022  3:30 PM Armbruster, Willaim Rayas, MD [facility]  10/31/2022 10:00 AM Raynelle Dick, NP [facility] None  06/02/2023 11:00 AM Lucky Cowboy, MD [facility] None    ------------------------------------------------------------------------------------------------------------------   HPI There were no vitals taken for this visit. 87 y.o.female presents for  Past Medical History:  Diagnosis Date   Anxiety    takes Xanax daily as needed   Asthma    Albuterol daily as needed   Blood transfusion without reported diagnosis    CAD (coronary artery disease)    LHC 2003 with 40% pLAD, 30% mLAD, 100% D1 (moderate vessel), 100%  D2 (small vessel), 95% mid small OM2.  Pt had PTCA to D1;  D2 and OM2 small vessels and tx medically;  Last Myoview (2012): anterior infarct seen with scar, no ischemia. EF normal. Med Rx // Myoview (12/14):  Low risk; ant scar w/ peri-infarct ischemia; EF 55% with ant AK // Myoview 5/16: EF 55-65, ant, apical scar, no ischemia    Cataract    Chronic back pain    HNP, DDD, spinal stenosis, vertebral compression fractures.    Chronic nausea    takes Zofran daily as needed.  normal gastric emptying study 03/2013   CKD (chronic kidney disease), stage III (HCC) 09/27/2017   Constipation    felt to be functional. takes Senokot and Miralax daily.  treated with Linzess in past.    DIVERTICULOSIS, COLON 08/28/2008   Qualifier: Diagnosis of  By: Bascom Levels, [name], [name]     Elevated LFTs 2015   probably med induced DILI, from Augmentin vs Pravastatin   GERD (gastroesophageal reflux disease)    hx  of esophageal stricture    Hiatal hernia    Paraesophageal hernias. s/p fundoplications in 2013 and 04/2013   History of bronchitis several of yrs ago   History of colon polyps 03/2009, 03/2011   adenomatous colon polyps, no high grade dysplasia.    History of vertigo    no meds   Hyperlipidemia    takes Pravastatin daily   Hypertension    takes Amlodipine,Metoprolol,and Losartan daily   Multiple closed pelvic fractures without disruption of pelvic circle (HCC)    Nausea 03/2016   Osteoporosis    PONV (postoperative nausea and vomiting)    yrs ago   Pubic ramus fracture (HCC) 12/20/2019   Slow urinary stream    takes Rapaflo daily   TIA (transient ischemic attack) 1995   no residual effects noted   Vertebral compression fracture (HCC) 06/23/2012     Allergies  Allergen Reactions   Ace Inhibitors Cough    Per Dr Sherene Sires pulmonology 2013   Aspartame Diarrhea and Nausea And Vomiting   Atorvastatin Other (See Comments)    Muscle aches   Biaxin [Clarithromycin] Other (See Comments)    Unknown reaction : PATIENT CAN TOLERATE Z-PAK.  Is written on patient's paper chart.   Daliresp [Roflumilast] Nausea And Vomiting   Erythromycin Other (See Comments)    Unknown reaction: PATIENT CAN TOLERATE Z-PAK.  It is written on patient's paper chart.   Levofloxacin Nausea And Vomiting    Current Outpatient Medications on  File Prior to Visit  Medication Sig   albuterol (PROVENTIL) (2.5 MG/3ML) 0.083% nebulizer solution Take 3 mLs (2.5 mg total) by nebulization every 4 (four) hours as needed for wheezing or shortness of breath.   albuterol (VENTOLIN HFA) 108 (90 Base) MCG/ACT inhaler INHALE 2 PUFFS BY MOUTH EVERY 4 HOURS AS NEEDED FOR WHEEZING FOR SHORTNESS OF BREATH   ALPRAZolam (XANAX) 0.5 MG tablet Take 1/2 tablet 1- 2 x /day ONLY if needed for Anxiety or Panic Attack &  limit to 5 days /week to avoid Addiction - DO NOT take with Hydrocodone or Codeine   amLODipine (NORVASC) 10 MG tablet Take    1 tablet  Daily for BP & Heart                                                          /                              TAKE                              BY                             MOUTH                         ONCE DAILY   anti-nausea (EMETROL) solution Take 10 mLs by mouth every 15 (fifteen) minutes as needed for nausea or vomiting.   aspirin EC 81 MG tablet Take 81 mg by mouth at bedtime. Every other night   budesonide (PULMICORT) 0.25 MG/2ML nebulizer solution Take 2 mLs (0.25 mg total) by nebulization 4 (four) times daily.   busPIRone (BUSPAR) 10 MG tablet Take 1/2 to 1 tablet 3 x /day if Needed for Anxiety or Panic Attack  (( Replaces Xanax ))   Cholecalciferol (VITAMIN D3) 5000 units CAPS Take 5,000 Units by mouth 2 (two) times daily.    gabapentin (NEURONTIN) 600 MG tablet Take  1/2 to 1 tablet  3 to 4 x /Daily  as needed for Pain   ketoconazole (NIZORAL) 2 % cream Apply to  Ringworm rash  2 x /day   Magnesium 400 MG TABS Take 400 mg by mouth daily at 2 PM.   melatonin 5 MG TABS Take 5 mg by mouth at bedtime as needed.   metoprolol tartrate (LOPRESSOR) 50 MG tablet Take 1 tablet  2 x /day(every 12 hours)  for BP & Heart                                /                          TAKE                      BY                   MOUTH   mometasone-formoterol (DULERA) 200-5 MCG/ACT AERO Take 2 puffs first thing in  am and then another 2 puffs about 12 hours later.   Multiple Vitamins-Minerals (MULTIVITAMIN WITH MINERALS) tablet Take 1 tablet by mouth daily.   nitroGLYCERIN (NITROSTAT) 0.4 MG SL tablet Place 1 tablet (0.4 mg total) under the tongue every 5 (five) minutes as needed for chest pain.   predniSONE (DELTASONE) 10 MG tablet Take  4 each am x 2 days,   2 each am x 2 days,  1 each am x 2 days and stop   promethazine (PHENERGAN) 25 MG tablet TAKE 1 TABLET BY MOUTH 4 TIMES DAILY IF  NEEDED  FOR  NAUSEA  AND  VOMITING.   senna-docusate (SENOKOT-S) 8.6-50 MG tablet Take 2 tablets by mouth 2  (two) times daily.   solifenacin (VESICARE) 5 MG tablet Take 5 mg by mouth daily.   sucralfate (CARAFATE) 1 g tablet Take 1 g by mouth 4 (four) times daily -  with meals and at bedtime.   trospium (SANCTURA) 20 MG tablet Take 20 mg by mouth 2 (two) times daily.   vitamin B-12 (CYANOCOBALAMIN) 1000 MCG tablet Take 5,000 mcg by mouth daily.   No current facility-administered medications on file prior to visit.    ROS: all negative except above.   Physical Exam:  There were no vitals taken for this visit.  General Appearance: Well nourished, in no apparent distress. Eyes: PERRLA, EOMs, conjunctiva no swelling or erythema Sinuses: No Frontal/maxillary tenderness ENT/Mouth: Ext aud canals clear, TMs without erythema, bulging. No erythema, swelling, or exudate on post pharynx.  Tonsils not swollen or erythematous. Hearing normal.  Neck: Supple, thyroid normal.  Respiratory: Respiratory effort normal, BS equal bilaterally without rales, rhonchi, wheezing or stridor.  Cardio: RRR with no MRGs. Brisk peripheral pulses without edema.  Abdomen: Soft, + BS.  Non tender, no guarding, rebound, hernias, masses. Lymphatics: Non tender without lymphadenopathy.  Musculoskeletal: Full ROM, 5/5 strength, normal gait.  Skin: Warm, dry without rashes, lesions, ecchymosis.  Neuro: Cranial nerves intact. Normal muscle tone, no cerebellar symptoms. Sensation intact.  Psych: Awake and oriented X 3, normal affect, Insight and Judgment appropriate.     Raynelle Dick, NP 1:55 PM [facility]

## 2022-10-13 ENCOUNTER — Other Ambulatory Visit: Payer: Self-pay

## 2022-10-13 ENCOUNTER — Encounter: Payer: Self-pay | Admitting: Nurse Practitioner

## 2022-10-13 ENCOUNTER — Ambulatory Visit (INDEPENDENT_AMBULATORY_CARE_PROVIDER_SITE_OTHER): Payer: PPO | Admitting: Nurse Practitioner

## 2022-10-13 VITALS — BP 122/58 | HR 75 | Temp 97.3°F | Ht 63.0 in | Wt 145.0 lb

## 2022-10-13 DIAGNOSIS — I10 Essential (primary) hypertension: Secondary | ICD-10-CM | POA: Diagnosis not present

## 2022-10-13 DIAGNOSIS — B351 Tinea unguium: Secondary | ICD-10-CM

## 2022-10-13 MED ORDER — TERBINAFINE HCL 5 % EX SOLN
CUTANEOUS | 2 refills | Status: DC
Start: 2022-10-13 — End: 2022-10-13

## 2022-10-13 NOTE — Patient Instructions (Signed)
Fungal Nail Infection A fungal nail infection is a common infection of the toenails or fingernails. This condition affects toenails more often than fingernails. It often affects the great, or big, toes. More than one nail may be infected. The condition can be passed from person to person (is contagious). What are the causes? This condition is caused by a fungus, such as yeast or molds. Several types of fungi can cause the infection. These fungi are common in moist and warm areas. If your hands or feet come into contact with the fungus, it may get into a crack in your fingernail or toenail or in the surrounding skin, and cause an infection. What increases the risk? The following factors may make you more likely to develop this condition: Being of older age. Having certain medical conditions, such as: Athlete's foot. Diabetes. Poor circulation. A weak body defense system (immune system). Walking barefoot in areas where the fungus thrives, such as showers or locker rooms. Wearing shoes and socks that cause your feet to sweat. Having a nail injury or a recent nail surgery. What are the signs or symptoms? Symptoms of this condition include: A pale spot on the nail. Thickening of the nail. A nail that becomes yellow, brown, or white. A brittle or ragged nail edge. A nail that has lifted away from the nail bed. How is this diagnosed? This condition is diagnosed with a physical exam. Your health care provider may take a scraping or clipping from your nail to test for the fungus. How is this treated? Treatment is not needed for mild infections. If you have significant nail changes, treatment may include: Antifungal medicines taken by mouth (orally). You may need to take the medicine for several weeks or several months, and you may not see the results for a long time. These medicines can cause side effects. Ask your health care provider what problems to watch for. Antifungal nail polish or nail  cream. These may be used along with oral antifungal medicines. Laser treatment of the nail. Surgery to remove the nail. This may be needed for the most severe infections. It can take a long time, usually up to a year, for the infection to go away. The infection may also come back. Follow these instructions at home: Medicines Take or apply over-the-counter and prescription medicines only as told by your health care provider. Ask your health care provider about using over-the-counter mentholated ointment on your nails. Nail care Trim your nails often. Wash and dry your hands and feet every day. Keep your feet dry. To do this: Wear absorbent socks, and change your socks frequently. Wear shoes that allow air to circulate, such as sandals or canvas tennis shoes. Throw out old shoes. If you go to a nail salon, make sure you choose one that uses clean instruments. Use antifungal foot powder on your feet and in your shoes. General instructions Do not share personal items, such as towels or nail clippers. Do not walk barefoot in shower rooms or locker rooms. Wear rubber gloves if you are working with your hands in wet areas. Keep all follow-up visits. This is important. Contact a health care provider if: You have redness, pain, or pus near the toenail or fingernail. Your infection is not getting better, or it is getting worse after several months. You have more circulation problems near the toenail or fingernail. You have brown or black discoloration of the nail that spreads to the surrounding skin. Summary A fungal nail infection is a common   infection of the toenails or fingernails. Treatment is not needed for mild infections. If you have significant nail changes, treatment may include taking medicine orally and applying medicine to your nails. It can take a long time, usually up to a year, for the infection to go away. The infection may also come back. Take or apply over-the-counter and  prescription medicines only as told by your health care provider. This information is not intended to replace advice given to you by your health care provider. Make sure you discuss any questions you have with your health care provider. Document Revised: 07/27/2020 Document Reviewed: 07/27/2020 Elsevier Patient Education  2024 [employer]  

## 2022-10-15 ENCOUNTER — Other Ambulatory Visit: Payer: Self-pay | Admitting: Internal Medicine

## 2022-10-15 MED ORDER — BUSPIRONE HCL 10 MG PO TABS: ORAL_TABLET | ORAL | 0 refills | Status: DC

## 2022-10-16 ENCOUNTER — Encounter: Payer: Self-pay | Admitting: Certified Registered Nurse Anesthetist

## 2022-10-19 ENCOUNTER — Telehealth: Payer: Self-pay | Admitting: Gastroenterology

## 2022-10-19 ENCOUNTER — Telehealth: Payer: Self-pay

## 2022-10-19 ENCOUNTER — Encounter: Payer: Self-pay | Admitting: Gastroenterology

## 2022-10-19 ENCOUNTER — Ambulatory Visit (AMBULATORY_SURGERY_CENTER): Payer: PPO | Admitting: Gastroenterology

## 2022-10-19 VITALS — BP 150/74 | HR 67 | Temp 97.3°F | Resp 16 | Ht 63.0 in | Wt 148.0 lb

## 2022-10-19 DIAGNOSIS — R110 Nausea: Secondary | ICD-10-CM

## 2022-10-19 DIAGNOSIS — K2950 Unspecified chronic gastritis without bleeding: Secondary | ICD-10-CM | POA: Diagnosis not present

## 2022-10-19 DIAGNOSIS — R933 Abnormal findings on diagnostic imaging of other parts of digestive tract: Secondary | ICD-10-CM | POA: Diagnosis not present

## 2022-10-19 DIAGNOSIS — I10 Essential (primary) hypertension: Secondary | ICD-10-CM | POA: Diagnosis not present

## 2022-10-19 DIAGNOSIS — R194 Change in bowel habit: Secondary | ICD-10-CM

## 2022-10-19 DIAGNOSIS — K2970 Gastritis, unspecified, without bleeding: Secondary | ICD-10-CM | POA: Diagnosis not present

## 2022-10-19 DIAGNOSIS — K5732 Diverticulitis of large intestine without perforation or abscess without bleeding: Secondary | ICD-10-CM | POA: Diagnosis not present

## 2022-10-19 MED ORDER — FLUCONAZOLE 200 MG PO TABS
ORAL_TABLET | ORAL | 0 refills | Status: DC
Start: 2022-10-19 — End: 2022-10-31

## 2022-10-19 MED ORDER — FLEET ENEMA 7-19 GM/118ML RE ENEM
1.0000 | ENEMA | Freq: Once | RECTAL | Status: AC
Start: 2022-10-19 — End: 2022-10-19
  Administered 2022-10-19: 1 via RECTAL

## 2022-10-19 MED ORDER — SODIUM CHLORIDE 0.9 % IV SOLN: 500.0000 mL | Freq: Once | INTRAVENOUS | Status: DC

## 2022-10-19 MED ORDER — FLUCONAZOLE 200 MG PO TABS
200.0000 mg | ORAL_TABLET | Freq: Every day | ORAL | 0 refills | Status: DC
Start: 2022-10-19 — End: 2022-10-31

## 2022-10-19 NOTE — Progress Notes (Signed)
Report given to PACU, vss 

## 2022-10-19 NOTE — Progress Notes (Signed)
Called to room to assist during endoscopic procedure.  Patient ID and intended procedure confirmed with present staff. Received instructions for my participation in the procedure from the performing physician.  

## 2022-10-19 NOTE — Telephone Encounter (Signed)
Received call patient overnight. Patient describes taking her magnesium citrate without any bowel movement. She is scheduled for procedures this afternoon. Told patient to take a dose of MiraLAX now and another dose of MiraLAX within an hour and a half.  Will have Dr. Lanetta Inch team reach out in the morning. Plan to take her enema 2 hours before the scheduled timing on her sheet. She can receive another dose of an enema in the office if she is not clean enough, she is only undergoing a flexible sigmoidoscopy for colonoscopy. Will forward to Dr. Lanetta Inch team and they can follow-up in the morning otherwise. No other questions.   Corliss Parish, MD  [facility] Office # [phone]

## 2022-10-19 NOTE — Progress Notes (Signed)
Pt's states no medical or surgical changes since previsit or office visit. 

## 2022-10-19 NOTE — Telephone Encounter (Signed)
Opened in error

## 2022-10-19 NOTE — Telephone Encounter (Signed)
Thanks for your help with this patient Gabe. Can someone from admitting or charge please contact this patient to see how she is doing this AM. Hopefully she will be ready for flex sig after what she has drank and enemas prior to her procedure. Thanks

## 2022-10-19 NOTE — Op Note (Signed)
[facility] Patient Name: [name] [name] Procedure Date: 10/19/2022 2:08 PM MRN: [id number] Endoscopist: Viviann Spare P. Adela Lank , MD, [id number] Age: 87 Referring MD:  Date of Birth: 07-12-1935 Gender: Female Account #: [id number] Procedure:                Upper GI endoscopy Indications:              Nausea - history of esophageal candidiasis Medicines:                Monitored Anesthesia Care Procedure:                Pre-Anesthesia Assessment:                           - Prior to the procedure, a History and Physical                            was performed, and patient medications and                            allergies were reviewed. The patient's tolerance of                            previous anesthesia was also reviewed. The risks                            and benefits of the procedure and the sedation                            options and risks were discussed with the patient.                            All questions were answered, and informed consent                            was obtained. Prior Anticoagulants: The patient has                            taken no anticoagulant or antiplatelet agents. ASA                            Grade Assessment: III - A patient with severe                            systemic disease. After reviewing the risks and                            benefits, the patient was deemed in satisfactory                            condition to undergo the procedure.                           After obtaining informed consent, the endoscope was  passed under direct vision. Throughout the                            procedure, the patient's blood pressure, pulse, and                            oxygen saturations were monitored continuously. The                            Olympus Scope [id number] was introduced through the                            mouth, and advanced to the second part of duodenum.                             The upper GI endoscopy was accomplished without                            difficulty. The patient tolerated the procedure                            well. Scope In: Scope Out: Findings:                 Esophagogastric landmarks were identified: the                            Z-line was found at 35 cm, the gastroesophageal                            junction was found at 35 cm and the upper extent of                            the gastric folds was found at 35 cm from the                            incisors.                           Diffuse, white plaques were found in the entire                            esophagus consistent with esophageal candidiasis.                           The lower third of the esophagus was tortuous with                            an angulated entrance into the stomach.                           The exam of the esophagus was otherwise normal.                           Diffuse atrophic mucosa  was found in the gastric                            antrum.                           Evidence of a Nissen fundoplication was found in                            the cardia. The wrap appeared intact. There was a                            retained suture in place.                           The exam of the stomach was otherwise normal.                           Biopsies were taken with a cold forceps for                            Helicobacter pylori testing.                           The examined duodenum was normal. Complications:            No immediate complications. Estimated blood loss:                            Minimal. Estimated Blood Loss:     Estimated blood loss was minimal. Estimated blood                            loss was minimal. Impression:               - Esophagogastric landmarks identified.                           - Esophageal plaques were found, consistent with                            candidiasis.                           - Tortuous  esophagus.                           - Normal esophagus otherwise                           - Gastric mucosal atrophy.                           - History of Nissen fundoplication - intact                           - Normal stomach otherwise - biopsies taken to rule  out H pylori                           - Normal examined duodenum.                           Possible candidiasis could be related to the                            patient's nausea. Recommendation:           - Patient has a contact number available for                            emergencies. The signs and symptoms of potential                            delayed complications were discussed with the                            patient. Return to normal activities tomorrow.                            Written discharge instructions were provided to the                            patient.                           - Resume previous diet.                           - Continue present medications.                           - If no medication interactions, recommend                            fluconazole 400mg  x 1 dose then another 200mg  / day                            for 13 days (14 day treatment course for                            candidiasis)                           - Await pathology results.                           - Continue antiemetics as needed                           - If symptoms persist despite treatment of                            candidiasis, call for reassessment Viviann Spare P. Adela Lank, MD 10/19/2022 2:39:30 PM  This report has been signed electronically.

## 2022-10-19 NOTE — Progress Notes (Signed)
[facility] History and Physical   Primary Care Physician:  Lucky Cowboy, MD   Reason for Procedure:   Abnormal CT scan, nausea, altered bowel habits  Plan:    EGD and flex sig     HPI: [name] [name] is a 87 y.o. female  here for EGD and flex sig. Symptoms of prior abdominal pain, loose stools, nausea. Led to CT Scan 3/31 which showed changes in the left colon concerning for possible colitis. Stool studies negative. Also with history of nausea. Last EGD 07/2019, had esophageal candidiasis. Last full colonoscopy in 05/2017 with health colon, a few small polyps. Still has occasional diarrhea. Using phenergan which works well for her, Zofran has not provided benefit.     Otherwise feels well without any cardiopulmonary symptoms. Had recent cardiac workup with stress test and echo which looked okay.   I have discussed risks / benefits of anesthesia and endoscopic procedure with [name] [name] and they wish to proceed with the exams as outlined today.    Past Medical History:  Diagnosis Date   Anxiety    takes Xanax daily as needed   Asthma    Albuterol daily as needed   Blood transfusion without reported diagnosis    CAD (coronary artery disease)    LHC 2003 with 40% pLAD, 30% mLAD, 100% D1 (moderate vessel), 100%  D2 (small vessel), 95% mid small OM2.  Pt had PTCA to D1;  D2 and OM2 small vessels and tx medically;  Last Myoview (2012): anterior infarct seen with scar, no ischemia. EF normal. Med Rx // Myoview (12/14):  Low risk; ant scar w/ peri-infarct ischemia; EF 55% with ant AK // Myoview 5/16: EF 55-65, ant, apical scar, no ischemia    Cataract    Chronic back pain    HNP, DDD, spinal stenosis, vertebral compression fractures.    Chronic nausea    takes Zofran daily as needed.  normal gastric emptying study 03/2013   CKD (chronic kidney disease), stage III (HCC) 09/27/2017   Constipation    felt to be functional. takes Senokot and Miralax daily.  treated with  Linzess in past.    DIVERTICULOSIS, COLON 08/28/2008   Qualifier: Diagnosis of  By: Bascom Levels, [name], [name]     Elevated LFTs 2015   probably med induced DILI, from Augmentin vs Pravastatin   GERD (gastroesophageal reflux disease)    hx of esophageal stricture    Hiatal hernia    Paraesophageal hernias. s/p fundoplications in 2013 and 04/2013   History of bronchitis several of yrs ago   History of colon polyps 03/2009, 03/2011   adenomatous colon polyps, no high grade dysplasia.    History of vertigo    no meds   Hyperlipidemia    takes Pravastatin daily   Hypertension    takes Amlodipine,Metoprolol,and Losartan daily   Multiple closed pelvic fractures without disruption of pelvic circle (HCC)    Nausea 03/2016   Osteoporosis    PONV (postoperative nausea and vomiting)    yrs ago   Pubic ramus fracture (HCC) 12/20/2019   Slow urinary stream    takes Rapaflo daily   TIA (transient ischemic attack) 1995   no residual effects noted   Vertebral compression fracture (HCC) 06/23/2012    Past Surgical History:  Procedure Laterality Date   APPENDECTOMY     patient unsure of date   BACK SURGERY     BLADDER REPAIR     CHOLECYSTECTOMY     Patient unsure of  date.   CORONARY ANGIOPLASTY  11/16/2001   1 stent   ESOPHAGEAL MANOMETRY N/A 03/25/2013   Procedure: ESOPHAGEAL MANOMETRY (EM);  Surgeon: Louis Meckel, MD;  Location: [facility];  Service: Endoscopy;  Laterality: N/A;   ESOPHAGOGASTRODUODENOSCOPY N/A 03/15/2013   Procedure: ESOPHAGOGASTRODUODENOSCOPY (EGD);  Surgeon: Beverley Fiedler, MD;  Location: [facility] ENDOSCOPY;  Service: Endoscopy;  Laterality: N/A;   ESOPHAGOGASTRODUODENOSCOPY N/A 12/25/2015   Procedure: ESOPHAGOGASTRODUODENOSCOPY (EGD);  Surgeon: Sherrilyn Rist, MD;  Location: [facility] ENDOSCOPY;  Service: Endoscopy;  Laterality: N/A;   EYE SURGERY  11/2000   bilateral cataracts with lens implant   FIXATION KYPHOPLASTY LUMBAR SPINE  X 2   "L3-4"   HEMORRHOID SURGERY     HIATAL  HERNIA REPAIR  06/03/2011   Procedure: LAPAROSCOPIC REPAIR OF HIATAL HERNIA;  Surgeon: Ernestene Mention, MD;  Location: [facility] ORS;  Service: General;  Laterality: N/A;   HIATAL HERNIA REPAIR N/A 04/24/2013   Procedure: LAPAROSCOPIC  REPAIR RECURRENT PARASOPHAGEAL HIATAL HERNIA WITH FUNDOPLICATION;  Surgeon: Ardeth Sportsman, MD;  Location: [facility] ORS;  Service: General;  Laterality: N/A;   INSERTION OF MESH N/A 04/24/2013   Procedure: INSERTION OF MESH;  Surgeon: Ardeth Sportsman, MD;  Location: [facility]S;  Service: General;  Laterality: N/A;   IR KYPHO THORACIC WITH BONE BIOPSY  09/29/2017   IR KYPHO THORACIC WITH BONE BIOPSY  11/03/2017   IR KYPHO THORACIC WITH BONE BIOPSY  02/14/2019   IR KYPHO THORACIC WITH BONE BIOPSY  04/02/2019   LAPAROSCOPIC LYSIS OF ADHESIONS N/A 04/24/2013   Procedure: LAPAROSCOPIC LYSIS OF ADHESIONS;  Surgeon: Ardeth Sportsman, MD;  Location: [facility];  Service: General;  Laterality: N/A;   LAPAROSCOPIC NISSEN FUNDOPLICATION  06/03/2011   Procedure: LAPAROSCOPIC NISSEN FUNDOPLICATION;  Surgeon: Ernestene Mention, MD;  Location: [facility]S;  Service: General;  Laterality: N/A;   LUMBAR FUSION  12/07/2015   Right L2-3 facetectomy, posterolateral fusion L2-3 with pedicle screws, rods, local bone graft, Vivigen cancellous chips. Fusion extended to the L1 level with pedicle screws and rods   LUMBAR LAMINECTOMY/DECOMPRESSION MICRODISCECTOMY Right 06/26/2012   Procedure: LUMBAR LAMINECTOMY/DECOMPRESSION MICRODISCECTOMY;  Surgeon: Kerrin Champagne, MD;  Location: [facility] OR;  Service: Orthopedics;  Laterality: Right;  RIGHT L4-5 MICRODISCECTOMY   LUMBAR LAMINECTOMY/DECOMPRESSION MICRODISCECTOMY N/A 05/29/2015   Procedure: RIGHT L2-3 MICRODISCECTOMY;  Surgeon: Kerrin Champagne, MD;  Location: [facility] OR;  Service: Orthopedics;  Laterality: N/A;   TOTAL ABDOMINAL HYSTERECTOMY  1975   partial   UPPER GASTROINTESTINAL ENDOSCOPY      Prior to Admission medications   Medication Sig Start Date End Date Taking?  Authorizing Provider  albuterol (VENTOLIN HFA) 108 (90 Base) MCG/ACT inhaler INHALE 2 PUFFS BY MOUTH EVERY 4 HOURS AS NEEDED FOR WHEEZING FOR SHORTNESS OF BREATH 03/22/22  Yes Nyoka Cowden, MD  amLODipine (NORVASC) 10 MG tablet Take   1 tablet  Daily for BP & Heart                                                          /                              TAKE  BY                             MOUTH                         ONCE DAILY 12/01/21  Yes Lucky Cowboy, MD  busPIRone (BUSPAR) 10 MG tablet Take 1/2 to 1 tablet 3 x /day if Needed for Anxiety or Panic Attack  (( Replaces Xanax )) 10/15/22  Yes Lucky Cowboy, MD  Cholecalciferol (VITAMIN D3) 5000 units CAPS Take 5,000 Units by mouth 2 (two) times daily.    Yes [provider]  gabapentin (NEURONTIN) 600 MG tablet Take  1/2 to 1 tablet  3 to 4 x /Daily  as needed for Pain 07/06/22  Yes Lucky Cowboy, MD  ketoconazole (NIZORAL) 2 % cream Apply to  Ringworm rash  2 x /day 12/10/21  Yes Lucky Cowboy, MD  Magnesium 400 MG TABS Take 400 mg by mouth daily at 2 PM.   Yes [provider]  melatonin 5 MG TABS Take 5 mg by mouth at bedtime as needed.   Yes [provider]  metoprolol tartrate (LOPRESSOR) 50 MG tablet Take 1 tablet  2 x /day(every 12 hours)  for BP & Heart                                /                          TAKE                      BY                   MOUTH 10/03/21  Yes Lucky Cowboy, MD  Multiple Vitamins-Minerals (MULTIVITAMIN WITH MINERALS) tablet Take 1 tablet by mouth daily.   Yes [provider]  promethazine (PHENERGAN) 25 MG tablet TAKE 1 TABLET BY MOUTH 4 TIMES DAILY IF  NEEDED  FOR  NAUSEA  AND  VOMITING. 04/12/22  Yes Raynelle Dick, NP  senna-docusate (SENOKOT-S) 8.6-50 MG tablet Take 2 tablets by mouth 2 (two) times daily. 12/31/19  Yes Rodolph Bong, MD  sucralfate (CARAFATE) 1 g tablet Take 1 g by mouth 4 (four) times daily -  with meals and at  bedtime.   Yes [provider]  trospium (SANCTURA) 20 MG tablet Take 20 mg by mouth 2 (two) times daily. 12/02/19  Yes [provider]  vitamin B-12 (CYANOCOBALAMIN) 1000 MCG tablet Take 5,000 mcg by mouth daily.   Yes [provider]  albuterol (PROVENTIL) (2.5 MG/3ML) 0.083% nebulizer solution Take 3 mLs (2.5 mg total) by nebulization every 4 (four) hours as needed for wheezing or shortness of breath. 01/28/22   Nyoka Cowden, MD  anti-nausea (EMETROL) solution Take 10 mLs by mouth every 15 (fifteen) minutes as needed for nausea or vomiting.    [provider]  aspirin EC 81 MG tablet Take 81 mg by mouth at bedtime. Every other night    [provider]  budesonide (PULMICORT) 0.25 MG/2ML nebulizer solution Take 2 mLs (0.25 mg total) by nebulization 4 (four) times daily. 07/12/22   Nyoka Cowden, MD  mometasone-formoterol (DULERA) 200-5 MCG/ACT AERO Take 2 puffs first thing in am and then another 2  puffs about 12 hours later. 09/08/22   Nyoka Cowden, MD  nitroGLYCERIN (NITROSTAT) 0.4 MG SL tablet Place 1 tablet (0.4 mg total) under the tongue every 5 (five) minutes as needed for chest pain. 09/04/14   Laurey Morale, MD  predniSONE (DELTASONE) 10 MG tablet Take  4 each am x 2 days,   2 each am x 2 days,  1 each am x 2 days and stop 09/05/22   Nyoka Cowden, MD  solifenacin (VESICARE) 5 MG tablet Take 5 mg by mouth daily. 07/11/22   [provider]    Current Outpatient Medications  Medication Sig Dispense Refill   albuterol (VENTOLIN HFA) 108 (90 Base) MCG/ACT inhaler INHALE 2 PUFFS BY MOUTH EVERY 4 HOURS AS NEEDED FOR WHEEZING FOR SHORTNESS OF BREATH 9 g 11   amLODipine (NORVASC) 10 MG tablet Take   1 tablet  Daily for BP & Heart                                                          /                              TAKE                              BY                             MOUTH                         ONCE DAILY 90 tablet 3    busPIRone (BUSPAR) 10 MG tablet Take 1/2 to 1 tablet 3 x /day if Needed for Anxiety or Panic Attack  (( Replaces Xanax )) 90 tablet 0   Cholecalciferol (VITAMIN D3) 5000 units CAPS Take 5,000 Units by mouth 2 (two) times daily.      gabapentin (NEURONTIN) 600 MG tablet Take  1/2 to 1 tablet  3 to 4 x /Daily  as needed for Pain 360 tablet 1   ketoconazole (NIZORAL) 2 % cream Apply to  Ringworm rash  2 x /day 60 g 3   Magnesium 400 MG TABS Take 400 mg by mouth daily at 2 PM.     melatonin 5 MG TABS Take 5 mg by mouth at bedtime as needed.     metoprolol tartrate (LOPRESSOR) 50 MG tablet Take 1 tablet  2 x /day(every 12 hours)  for BP & Heart                                /                          TAKE                      BY                   MOUTH 180 tablet 3   Multiple Vitamins-Minerals (MULTIVITAMIN WITH MINERALS) tablet Take 1 tablet  by mouth daily.     promethazine (PHENERGAN) 25 MG tablet TAKE 1 TABLET BY MOUTH 4 TIMES DAILY IF  NEEDED  FOR  NAUSEA  AND  VOMITING. 120 tablet 0   senna-docusate (SENOKOT-S) 8.6-50 MG tablet Take 2 tablets by mouth 2 (two) times daily. 60 tablet 1   sucralfate (CARAFATE) 1 g tablet Take 1 g by mouth 4 (four) times daily -  with meals and at bedtime.     trospium (SANCTURA) 20 MG tablet Take 20 mg by mouth 2 (two) times daily.     vitamin B-12 (CYANOCOBALAMIN) 1000 MCG tablet Take 5,000 mcg by mouth daily.     albuterol (PROVENTIL) (2.5 MG/3ML) 0.083% nebulizer solution Take 3 mLs (2.5 mg total) by nebulization every 4 (four) hours as needed for wheezing or shortness of breath. 75 mL 2   anti-nausea (EMETROL) solution Take 10 mLs by mouth every 15 (fifteen) minutes as needed for nausea or vomiting.     aspirin EC 81 MG tablet Take 81 mg by mouth at bedtime. Every other night     budesonide (PULMICORT) 0.25 MG/2ML nebulizer solution Take 2 mLs (0.25 mg total) by nebulization 4 (four) times daily. 60 mL 12   mometasone-formoterol (DULERA) 200-5 MCG/ACT AERO Take 2  puffs first thing in am and then another 2 puffs about 12 hours later. 13 g 5   nitroGLYCERIN (NITROSTAT) 0.4 MG SL tablet Place 1 tablet (0.4 mg total) under the tongue every 5 (five) minutes as needed for chest pain. 90 tablet 3   predniSONE (DELTASONE) 10 MG tablet Take  4 each am x 2 days,   2 each am x 2 days,  1 each am x 2 days and stop 14 tablet 11   solifenacin (VESICARE) 5 MG tablet Take 5 mg by mouth daily.     Current Facility-Administered Medications  Medication Dose Route Frequency Provider Last Rate Last Admin   0.9 %  sodium chloride infusion  500 mL Intravenous Once Evy Lutterman, Willaim Rayas, MD       sodium phosphate (FLEET) 7-19 GM/118ML enema 1 enema  1 enema Rectal Once Nainoa Woldt, Willaim Rayas, MD        Allergies as of 10/19/2022 - Review Complete 10/19/2022  Allergen Reaction Noted   Ace inhibitors Cough 04/03/2013   Aspartame Diarrhea and Nausea And Vomiting    Atorvastatin Other (See Comments) 09/04/2014   Biaxin [clarithromycin] Other (See Comments) 03/07/2014   Daliresp [roflumilast] Nausea And Vomiting 05/23/2011   Erythromycin Other (See Comments) 03/07/2014   Levofloxacin Nausea And Vomiting 11/04/2013    Family History  Problem Relation Age of Onset   Colon cancer Mother 19   Heart attack Father 76       heart attack   Brain cancer Brother        tumor    Heart disease Brother    Ulcers Brother    Arthritis Brother    Kidney disease Daughter    Esophageal cancer Neg Hx    Stomach cancer Neg Hx     Social History   Socioeconomic History   Marital status: Married    Spouse name: [name]   Number of children: 2   Years of education: 12   Highest education level: Not on file  Occupational History   Occupation: Retired    Associate Professor: RETIRED    Comment: worked at [employer]  Tobacco Use   Smoking status: Never   Smokeless tobacco: Never  Building services engineer  Use: Never used  Substance and Sexual Activity   Alcohol use: No   Drug use: Never    Sexual activity: Not Currently    Partners: Male    Birth control/protection: Post-menopausal, Surgical  Other Topics Concern   Not on file  Social History Narrative   Lives with husband.   Right-handed.   3 cups caffeine per day.   Social Determinants of Health   Financial Resource Strain: Not on file  Food Insecurity: Not on file  Transportation Needs: No Transportation Needs (03/17/2021)   PRAPARE - Administrator, Civil Service (Medical): No    Lack of Transportation (Non-Medical): No  Physical Activity: Not on file  Stress: Not on file  Social Connections: Not on file  Intimate Partner Violence: Not on file    Review of Systems: All other review of systems negative except as mentioned in the HPI.  Physical Exam: Vital signs BP (!) 152/79   Pulse 70   Temp (!) 97.3 F (36.3 C)   Ht 5\' 3"  (1.6 m)   Wt 148 lb (67.1 kg)   SpO2 96%   BMI 26.22 kg/m   General:   Alert,  Well-developed, pleasant and cooperative in NAD Lungs:  Clear throughout to auscultation.   Heart:  Regular rate and rhythm Abdomen:  Soft, nontender and nondistended.   Neuro/Psych:  Alert and cooperative. Normal mood and affect. A and O x 3  Harlin Rain, MD [facility]

## 2022-10-19 NOTE — Patient Instructions (Signed)
Await pathology results.  A RX for Fluconazole was sent to your pharmacy.  Resume previous diet. Continue current medications.  Handout for diverticulosis and hemorrhoids provided.  YOU HAD AN ENDOSCOPIC PROCEDURE TODAY AT THE [facility]:   Refer to the procedure report that was given to you for any specific questions about what was found during the examination.  If the procedure report does not answer your questions, please call your gastroenterologist to clarify.  If you requested that your care partner not be given the details of your procedure findings, then the procedure report has been included in a sealed envelope for you to review at your convenience later.  YOU SHOULD EXPECT: Some feelings of bloating in the abdomen. Passage of more gas than usual.  Walking can help get rid of the air that was put into your GI tract during the procedure and reduce the bloating. If you had a lower endoscopy (such as a colonoscopy or flexible sigmoidoscopy) you may notice spotting of blood in your stool or on the toilet paper. If you underwent a bowel prep for your procedure, you may not have a normal bowel movement for a few days.  Please Note:  You might notice some irritation and congestion in your nose or some drainage.  This is from the oxygen used during your procedure.  There is no need for concern and it should clear up in a day or so.  SYMPTOMS TO REPORT IMMEDIATELY:  Following lower endoscopy (colonoscopy or flexible sigmoidoscopy):  Excessive amounts of blood in the stool  Significant tenderness or worsening of abdominal pains  Swelling of the abdomen that is new, acute  Fever of 100F or higher  Following upper endoscopy (EGD)  Vomiting of blood or coffee ground material  New chest pain or pain under the shoulder blades  Painful or persistently difficult swallowing  New shortness of breath  Fever of 100F or higher  Black, tarry-looking stools  For urgent or emergent  issues, a gastroenterologist can be reached at any hour by calling [phone]. Do not use MyChart messaging for urgent concerns.    DIET:  We do recommend a small meal at first, but then you may proceed to your regular diet.  Drink plenty of fluids but you should avoid alcoholic beverages for 24 hours.  ACTIVITY:  You should plan to take it easy for the rest of today and you should NOT DRIVE or use heavy machinery until tomorrow (because of the sedation medicines used during the test).    FOLLOW UP: Our staff will call the number listed on your records the next business day following your procedure.  We will call around 7:15- 8:00 am to check on you and address any questions or concerns that you may have regarding the information given to you following your procedure. If we do not reach you, we will leave a message.     If any biopsies were taken you will be contacted by phone or by letter within the next 1-3 weeks.  Please call us at [phone] if you have not heard about the biopsies in 3 weeks.    SIGNATURES/CONFIDENTIALITY: You and/or your care partner have signed paperwork which will be entered into your electronic medical record.  These signatures attest to the fact that that the information above on your After Visit Summary has been reviewed and is understood.  Full responsibility of the confidentiality of this discharge information lies with you and/or your care-partner.

## 2022-10-19 NOTE — Op Note (Signed)
[facility] Patient Name: [name] [name] Procedure Date: 10/19/2022 1:57 PM MRN: [id number] Endoscopist: Viviann Spare P. Adela Lank , MD, [id number] Age: 87 Referring MD:  Date of Birth: 12/16/1935 Gender: Female Account #: [id number] Procedure:                Flexible Sigmoidoscopy Indications:              Abnormal CT of the GI tract (changes concerning for                            possible chronic colitis - March 2024),                            intermittent diarrhea. stool studies negative Medicines:                Monitored Anesthesia Care Procedure:                Pre-Anesthesia Assessment:                           - Prior to the procedure, a History and Physical                            was performed, and patient medications and                            allergies were reviewed. The patient's tolerance of                            previous anesthesia was also reviewed. The risks                            and benefits of the procedure and the sedation                            options and risks were discussed with the patient.                            All questions were answered, and informed consent                            was obtained. Prior Anticoagulants: The patient has                            taken no anticoagulant or antiplatelet agents. ASA                            Grade Assessment: III - A patient with severe                            systemic disease. After reviewing the risks and                            benefits, the patient was deemed in satisfactory  condition to undergo the procedure.                           After obtaining informed consent, the scope was                            passed under direct vision. The Olympus [id number]-[id number]                            (#[id number]) Colonoscope was introduced through the                            anus and advanced to the the descending colon. The                             flexible sigmoidoscopy was accomplished without                            difficulty. The patient tolerated the procedure                            well. The quality of the bowel preparation was                            adequate. Scope In: 2:23:15 PM Scope Out: 2:29:01 PM Total Procedure Duration: 0 hours 5 minutes 46 seconds  Findings:                 The perianal and digital rectal examinations were                            normal.                           A few small-mouthed diverticula were found in the                            sigmoid colon.                           Internal hemorrhoids were found during retroflexion.                           The exam was otherwise without abnormality. NO                            inflammatory changes appreciated.                           Biopsies for histology were taken with a cold                            forceps from the left colon for evaluation of                            microscopic colitis. Complications:  No immediate complications. Estimated blood loss:                            Minimal. Estimated Blood Loss:     Estimated blood loss was minimal. Impression:               - Diverticulosis in the sigmoid colon.                           - Internal hemorrhoids.                           - The examination was otherwise normal.                           - Biopsies were taken with a cold forceps from the                            left colon for evaluation of microscopic colitis. Recommendation:           - Discharge patient to home.                           - Resume previous diet.                           - Continue present medications.                           - Await pathology results. Viviann Spare P. Adela Lank, MD 10/19/2022 2:42:48 PM This report has been signed electronically.

## 2022-10-19 NOTE — Progress Notes (Signed)
Enema given in Admitting.

## 2022-10-19 NOTE — Progress Notes (Signed)
1411 Robinul 0.1 mg IV given due large amount of secretions upon assessment.  MD made aware, vss  

## 2022-10-19 NOTE — Telephone Encounter (Signed)
Great, thanks

## 2022-10-19 NOTE — Telephone Encounter (Signed)
Called patient to check on her and her progress after taking the 2 doses of miralax. She said her stool is currently a yellow liquid and she is feeling much better about her procedure. She is planning on giving herself an enema 1 hour before coming in and will let us know when she arrives if she is still yellow and liquid. She had no further comments or concerns.

## 2022-10-20 ENCOUNTER — Telehealth: Payer: Self-pay | Admitting: *Deleted

## 2022-10-20 NOTE — Telephone Encounter (Signed)
  Follow up Call-     10/19/2022    1:48 PM  Call back number  Post procedure Call Back phone  # [phone]  Permission to leave phone message Yes     Patient questions:  Do you have a fever, pain , or abdominal swelling? No. Pain Score  0 *  Have you tolerated food without any problems? Yes.    Have you been able to return to your normal activities? Yes.    Do you have any questions about your discharge instructions: Diet   No. Medications  No. Follow up visit  No.  Do you have questions or concerns about your Care? No.  Actions: * If pain score is 4 or above: No action needed, pain <4.

## 2022-10-20 NOTE — Progress Notes (Signed)
Encounter for Annual Physical Exam   Assessment:   Encounter for General Adult Medical Examination with abnormal findings Due annually  Health Maintenance reviewed Healthy lifestyle reviewed and goals set  Atherosclerosis of aorta (HCC) Per numerous CTs Control blood pressure, cholesterol, glucose, increase exercise.   Essential hypertension - continue medications,  - DASH diet, exercise and monitor at home. Call if greater than 130/80.  - CBC with Differential/Platelet - CMP/GFR  Abnormal Glucose Discussed general issues about diabetes pathophysiology and management., Educational material distributed., Suggested low cholesterol diet., Encouraged aerobic exercise., Discussed foot care., Reminded to get yearly retinal exam. - Hemoglobin A1c q44m   Coronary artery disease due to lipid rich plaque Control blood pressure, cholesterol, glucose, increase exercise. Statin for LDL goal <70  - Lipid panel  CKD stage 3 Increase fluids, avoid NSAIDS, monitor sugars, will monitor  Recurrent major depressive disorder, in partial remission (HCC) Continue medication; much improved with Cymbalta 30 mg  Lifestyle discussed: diet/exerise, sleep hygiene, stress management, hydration  Incarcerated recurrent paraesophageal hiatal hernia s/p lap redo repair [id number] Improved, continue PPI   Asthma, unspecified asthma severity, uncomplicated Continue inhalers Go to the ER if any chest pain, shortness of breath, nausea, dizziness, severe HA, changes vision/speech  Chronic bronchitis (HCC) Continue inhalers, follows with pulm - albuterol refilled  Osteopenia Continue prolia via Dr. Corliss Skains, continue Vit D and Ca, weight bearing exercises - DEXA UTD 01/27/22- T score - 2.4 osteopenia   Herniated lumbar intervertebral disc Continue pain management  Vitamin D deficiency Elevated at last check and supplement was decreased , recheck today - Vit D  25 hydroxy (rtn osteoporosis  monitoring)  MGUS (monoclonal gammopathy of unknown significance) Continue to monitor; had negative biopsy  - CMP/GFR  Medication management - Magnesium  Chronic pain syndrome Continue pain management   Anxiety continue medications- buspar  10 mg TID as needed, stress management techniques discussed, increase water, good sleep hygiene discussed, increase exercise, and increase veggies.  - TSH   Gastroesophageal reflux disease, esophagitis presence not specified Continue PPI/H2 blocker, diet discussed - CMP/GFR - Magnesium    ESOPHAGEAL STRICTURE Improved on last, follow up GI as recommended; continue PPI   Constipation, chronic Continue on senokot and miralax-GI following  Spondylolisthesis, lumbar region Followed by spine specialist and pain management.   DDD (degenerative disc disease), lumbar Continue follow up pain management  Spinal stenosis, lumbar region, with neurogenic claudication Continue follow up pain management  History of lumbar spinal fusion Continue follow up pain management  Senile purpura (HCC) Secondary to age and aspirin; reassured; protect skin  Medication management -     CBC with Differential/Platelet -     COMPLETE METABOLIC PANEL WITH GFR -     Magnesium -     Lipid panel -     TSH -     Hemoglobin A1c -     VITAMIN D 25 Hydroxy (Vit-D Deficiency, Fractures) -     [place], RETROPERITNL ABD,  LTD -     Urinalysis, Routine w reflex microscopic -     Microalbumin / creatinine urine ratio  Screening, lipid -     Lipid panel  Screening for thyroid disorder -     TSH  Screening for AAA (abdominal aortic aneurysm) -     [place], RETROPERITNL ABD,  LTD  B12 deficiency Continue supplement -     Vitamin B12  Onychomycosis Continue topical fungal polish      Over 30 minutes of exam, counseling, chart review,  and critical decision making was performed  Future Appointments  Date Time Provider Department Center  06/02/2023 11:00 AM  Lucky Cowboy, MD [facility] None  10/31/2023 10:00 AM Raynelle Dick, NP [facility] None       Subjective:   [name] [name] is a 87 y.o. female who presents for CPE and follow up. She has Essential hypertension; Mild intermittent asthma without complication; ESOPHAGEAL STRICTURE; Incarcerated recurrent paraesophageal hiatal hernia s/p lap redo repair [id number]; MGUS (monoclonal gammopathy of unknown significance); Vitamin D deficiency; Osteoporosis; Coronary artery disease involving native heart without angina pectoris; Depression, major, in partial remission (HCC); Chronic pain syndrome; Hyperlipidemia, mixed; Gastroesophageal reflux disease; DDD (degenerative disc disease), lumbar; Spinal stenosis, lumbar region, with neurogenic claudication; Odynophagia; Atherosclerosis of aorta (HCC) by Lumbar CYT scan 01/29/2021; Chronic bronchitis (HCC); CKD (chronic kidney disease) stage 2, GFR 60-89 ml/min; Abnormal glucose (prediabetes); FHx: heart disease; Senile purpura (HCC); History of fundoplication; History of adenomatous polyp of colon; Iron deficiency; Generalized abdominal pain; Nausea without vomiting; Poor appetite; Abnormal CT scan, colon; LLQ pain; and Nausea and vomiting on their problem list.   Patient has a chronic pain syndrome and is followed by Dr Thyra Breed (pain management) and Dr Ophelia Charter (ortho).  Hx of numerous lumbar surgeries, disc and fusions historically, spinal stenosis, several thoracic compression fractures with kyphoplasty. Avoiding opioids. Taking gabapentin 400 mg QID PRN and topical voltaren gel. Also on cymbalta with benefit, feels well controlled at this time.   She is followed by Dr. Corliss Skains for prolia injections; hx/o Osteoporosis with T-2.6 at last DexaBMD on 11/2018 (improved from -2.8 in 2018). Dexa 01/27/2022 - T score improved to - 2.4 osteopenia  Follows closely with GI; hx of fundoplication; frequent nausea, had GI workup; she was advised to continue  omeprazole BID, also carafate.   She followes with Dr. Sherene Sires for COPD per imaging only, asthma, on breo and doing well.   She has history of MGUS with negative BX.   she has a diagnosis of anxiety./depression and is currently on buspar 10 mg TID as needed.   BMI is Body mass index is 26.58 kg/m., she has been working on diet and exercise, trying to work more in garden and trying to increase walking , will walk 30 min 2-3 days a week.  Wt Readings from Last 3 Encounters:  10/31/22 143 lb (64.9 kg)  10/19/22 148 lb (67.1 kg)  10/13/22 145 lb (65.8 kg)   She also has history of PTCA/stent placed in 2003 and low risk myoview in 2014. She has aortic atherosclerosis per numerous CTs, e.g 04/2019. She follows with Dr. Anne Fu.  Her blood pressure has been controlled at home, today their BP is BP: 122/62   BP Readings from Last 3 Encounters:  10/31/22 122/62  10/19/22 (!) 150/74  10/13/22 (!) 122/58  She does work out. She denies CP but endorses some exertional dyspnea if rushes.   She is not on cholesterol medication and denies myalgias. Her cholesterol is at goal of LDL <70. The cholesterol last visit was:   Lab Results  Component Value Date   CHOL 128 07/22/2022   HDL 59 07/22/2022   LDLCALC 46 07/22/2022   TRIG 147 07/22/2022   CHOLHDL 2.2 07/22/2022    She has been working on diet and exercise for prediabetes (x 2010), and denies polydipsia, polyuria and visual disturbances. Last A1C in the office was:  Lab Results  Component Value Date   HGBA1C 6.1 (H) 07/22/2022   Trying to  drink more water. Last GFR  Lab Results  Component Value Date   EGFR 69 07/22/2022   Patient is on Vitamin D supplement and at goal at last check, takes 94854 IU daily:  Lab Results  Component Value Date   VD25OH 118 (H) 07/22/2022     She is on a B12 supplement;  Lab Results  Component Value Date   VITAMINB12 772 10/15/2020    Medication Review Current Outpatient Medications on File Prior to  Visit  Medication Sig Dispense Refill   albuterol (PROVENTIL) (2.5 MG/3ML) 0.083% nebulizer solution Take 3 mLs (2.5 mg total) by nebulization every 4 (four) hours as needed for wheezing or shortness of breath. 75 mL 2   albuterol (VENTOLIN HFA) 108 (90 Base) MCG/ACT inhaler INHALE 2 PUFFS BY MOUTH EVERY 4 HOURS AS NEEDED FOR WHEEZING FOR SHORTNESS OF BREATH 9 g 11   amLODipine (NORVASC) 10 MG tablet Take   1 tablet  Daily for BP & Heart                                                          /                              TAKE                              BY                             MOUTH                         ONCE DAILY 90 tablet 3   anti-nausea (EMETROL) solution Take 10 mLs by mouth every 15 (fifteen) minutes as needed for nausea or vomiting.     aspirin EC 81 MG tablet Take 81 mg by mouth at bedtime. Every other night     budesonide (PULMICORT) 0.25 MG/2ML nebulizer solution Take 2 mLs (0.25 mg total) by nebulization 4 (four) times daily. 60 mL 12   busPIRone (BUSPAR) 10 MG tablet Take 1/2 to 1 tablet 3 x /day if Needed for Anxiety or Panic Attack  (( Replaces Xanax )) 90 tablet 0   Cholecalciferol (VITAMIN D3) 5000 units CAPS Take 5,000 Units by mouth 2 (two) times daily.      gabapentin (NEURONTIN) 600 MG tablet Take  1/2 to 1 tablet  3 to 4 x /Daily  as needed for Pain 360 tablet 1   ketoconazole (NIZORAL) 2 % cream Apply to  Ringworm rash  2 x /day 60 g 3   Magnesium 400 MG TABS Take 400 mg by mouth daily at 2 PM.     melatonin 5 MG TABS Take 5 mg by mouth at bedtime as needed.     metoprolol tartrate (LOPRESSOR) 50 MG tablet Take 1 tablet  2 x /day(every 12 hours)  for BP & Heart                                /  TAKE                      BY                   MOUTH 180 tablet 3   mometasone-formoterol (DULERA) 200-5 MCG/ACT AERO Take 2 puffs first thing in am and then another 2 puffs about 12 hours later. 13 g 5   Multiple Vitamins-Minerals (MULTIVITAMIN WITH  MINERALS) tablet Take 1 tablet by mouth daily.     nitroGLYCERIN (NITROSTAT) 0.4 MG SL tablet Place 1 tablet (0.4 mg total) under the tongue every 5 (five) minutes as needed for chest pain. 90 tablet 3   senna-docusate (SENOKOT-S) 8.6-50 MG tablet Take 2 tablets by mouth 2 (two) times daily. 60 tablet 1   solifenacin (VESICARE) 5 MG tablet Take 5 mg by mouth daily.     sucralfate (CARAFATE) 1 g tablet Take 1 g by mouth 4 (four) times daily -  with meals and at bedtime.     vitamin B-12 (CYANOCOBALAMIN) 1000 MCG tablet Take 5,000 mcg by mouth daily.     fluconazole (DIFLUCAN) 200 MG tablet Take 400mg  (2 tablets) on Day 1. (Patient not taking: Reported on 10/31/2022) 2 tablet 0   fluconazole (DIFLUCAN) 200 MG tablet Take 1 tablet (200 mg total) by mouth daily. Start 200mg  per day for 13 days on Day 2. (Patient not taking: Reported on 10/31/2022) 13 tablet 0   predniSONE (DELTASONE) 10 MG tablet Take  4 each am x 2 days,   2 each am x 2 days,  1 each am x 2 days and stop (Patient not taking: Reported on 10/31/2022) 14 tablet 11   promethazine (PHENERGAN) 25 MG tablet TAKE 1 TABLET BY MOUTH 4 TIMES DAILY IF  NEEDED  FOR  NAUSEA  AND  VOMITING. (Patient not taking: Reported on 10/31/2022) 120 tablet 0   trospium (SANCTURA) 20 MG tablet Take 20 mg by mouth 2 (two) times daily. (Patient not taking: Reported on 10/31/2022)     No current facility-administered medications on file prior to visit.    Current Problems (verified) Patient Active Problem List   Diagnosis Date Noted   Abnormal CT scan, colon 09/02/2022   LLQ pain 09/02/2022   Nausea and vomiting 09/02/2022   Generalized abdominal pain 07/27/2022   Nausea without vomiting 07/27/2022   Poor appetite 07/27/2022   Iron deficiency 10/16/2020   History of adenomatous polyp of colon 07/21/2020   History of fundoplication    Senile purpura (HCC) 07/05/2019   Abnormal glucose (prediabetes) 03/01/2018   FHx: heart disease 03/01/2018   CKD (chronic  kidney disease) stage 2, GFR 60-89 ml/min 09/27/2017   Chronic bronchitis (HCC) 08/09/2017   Atherosclerosis of aorta (HCC) by Lumbar CYT scan 01/29/2021 04/05/2017   Odynophagia    Spinal stenosis, lumbar region, with neurogenic claudication 05/29/2015   DDD (degenerative disc disease), lumbar 04/21/2015   Hyperlipidemia, mixed 11/24/2014   Gastroesophageal reflux disease 11/24/2014   Depression, major, in partial remission (HCC) 09/26/2014   Chronic pain syndrome 09/26/2014   Coronary artery disease involving native heart without angina pectoris    Osteoporosis 02/10/2014   Vitamin D deficiency 08/14/2013   MGUS (monoclonal gammopathy of unknown significance) 03/05/2013   ESOPHAGEAL STRICTURE 08/28/2008   Mild intermittent asthma without complication 07/29/2008   Incarcerated recurrent paraesophageal hiatal hernia s/p lap redo repair [id number] 01/31/2008   Essential hypertension 07/25/2007    Screening Tests Immunization History  Administered Date(s)  Administered   DT (Pediatric) 05/06/2014   Influenza Split 02/07/2011   Influenza Whole 02/09/2009, 01/19/2010, 02/13/2012   Influenza, High Dose Seasonal PF 03/22/2013, 01/15/2014, 02/27/2015, 01/14/2016, 01/18/2017, 02/14/2018, 02/14/2019, 03/16/2020, 02/05/2021, 03/10/2022   PFIZER(Purple Top)SARS-COV-2 Vaccination 05/25/2019, 06/15/2019, 03/24/2020   Pneumococcal Conjugate-13 05/06/2014   Pneumococcal Polysaccharide-23 02/09/2011   Pneumococcal-Unspecified 02/06/2010, 02/09/2012   Td 05/10/2003   Health Maintenance  Topic Date Due   Zoster Vaccines- Shingrix (1 of 2) Never done   COVID-19 Vaccine (4 - 2023-24 season) 01/07/2022   MAMMOGRAM  09/10/2022   INFLUENZA VACCINE  12/08/2022   Medicare Annual Wellness (AWV)  07/22/2023   DTaP/Tdap/Td (3 - Tdap) 05/06/2024   Pneumonia Vaccine 60+ Years old  Completed   DEXA SCAN  Completed   HPV VACCINES  Aged Out   Colonoscopy  Discontinued    Last colonoscopy: 05/2017,  adenomatous polyp, Dr. Adela Lank, 3 year recall but contacted GI and was advised no further, DONE EGD 07/2019, candida esophagitis Last mammogram: 09/2020 , wants to reduce to every other year DEXA 11/2018 L fem T -2.6, continue prolia per Dr. Corliss Skains, has scheduled 01/2022  Shingles: check at pharmacy   Names of Other Physician/Practitioners you currently use: 1. [facility]- here for primary care 2. Dr. Emily Filbert, eye doctor, last visit 07/2021 3. No dentist, dentures.   4. Derm Dr. Wallace Cullens, goes annually, has upcoming.   Patient Care Team: Lucky Cowboy, MD as PCP - General (Internal Medicine) Jake Bathe, MD as PCP - Cardiology (Cardiology) Louis Meckel, MD (Inactive) as Consulting Physician (Gastroenterology) Oretha Milch, MD as Consulting Physician (Pulmonary Disease) Laurey Morale, MD as Consulting Physician (Cardiology) Karie Soda, MD as Consulting Physician (General Surgery) Kerrin Champagne, MD (Inactive) as Consulting Physician (Orthopedic Surgery) Carlus Pavlov, MD as Consulting Physician (Endocrinology) Si Gaul, MD as Consulting Physician (Oncology) Lajoyce Corners, [facility] (Inactive) as Pharmacist (Pharmacist)  Allergies Allergies  Allergen Reactions   Ace Inhibitors Cough    Per Dr Sherene Sires pulmonology 2013   Aspartame Diarrhea and Nausea And Vomiting   Atorvastatin Other (See Comments)    Muscle aches   Biaxin [Clarithromycin] Other (See Comments)    Unknown reaction : PATIENT CAN TOLERATE Z-PAK.  Is written on patient's paper chart.   Daliresp [Roflumilast] Nausea And Vomiting   Erythromycin Other (See Comments)    Unknown reaction: PATIENT CAN TOLERATE Z-PAK.  It is written on patient's paper chart.   Levofloxacin Nausea And Vomiting    SURGICAL HISTORY She  has a past surgical history that includes Back surgery; Bladder repair; Hemorrhoid surgery; Cholecystectomy; Appendectomy; Total abdominal hysterectomy  (1975); Eye surgery (11/2000); Hiatal hernia repair (06/03/2011); Laparoscopic Nissen fundoplication (06/03/2011); Lumbar laminectomy/decompression microdiscectomy (Right, 06/26/2012); Esophagogastroduodenoscopy (N/A, 03/15/2013); Esophageal manometry (N/A, 03/25/2013); Hiatal hernia repair (N/A, 04/24/2013); Insertion of mesh (N/A, 04/24/2013); Laparoscopic lysis of adhesions (N/A, 04/24/2013); Fixation kyphoplasty lumbar spine (X 2); Coronary angioplasty (11/16/2001); Lumbar laminectomy/decompression microdiscectomy (N/A, 05/29/2015); Lumbar fusion (12/07/2015); Esophagogastroduodenoscopy (N/A, 12/25/2015); IR KYPHO THORACIC WITH BONE BIOPSY (09/29/2017); IR KYPHO THORACIC WITH BONE BIOPSY (11/03/2017); Upper gastrointestinal endoscopy; IR KYPHO THORACIC WITH BONE BIOPSY (02/14/2019); and IR KYPHO THORACIC WITH BONE BIOPSY (04/02/2019). FAMILY HISTORY Her family history includes Arthritis in her brother; Brain cancer in her brother; Colon cancer (age of onset: 13) in her mother; Heart attack (age of onset: 33) in her father; Heart disease in her brother; Kidney disease in her daughter; Ulcers in her brother. SOCIAL HISTORY She  reports that she has  never smoked. She has never used smokeless tobacco. She reports that she does not drink alcohol and does not use drugs.   Review of Systems  Constitutional:  Negative for malaise/fatigue and weight loss.  HENT:  Negative for hearing loss and tinnitus.   Eyes:  Negative for blurred vision and double vision.  Respiratory:  Negative for cough, sputum production, shortness of breath and wheezing.   Cardiovascular:  Negative for chest pain, palpitations, orthopnea, claudication and leg swelling.  Gastrointestinal:  Positive for constipation (uses Miralax with relief). Negative for abdominal pain, blood in stool, diarrhea, heartburn, melena, nausea and vomiting.  Genitourinary: Negative.  Negative for dysuria, flank pain, frequency (improved with medication), hematuria  and urgency.  Musculoskeletal:  Positive for back pain (chronic, lumbar). Negative for joint pain and myalgias.  Skin:  Negative for rash.       Fingernail and toenail fungus  Neurological:  Positive for tingling (bottom of feet). Negative for dizziness, sensory change, weakness and headaches.  Endo/Heme/Allergies:  Negative for polydipsia.  Psychiatric/Behavioral: Negative.  Negative for depression, substance abuse and suicidal ideas. The patient is not nervous/anxious and does not have insomnia.   All other systems reviewed and are negative.    Objective:   Today's Vitals   10/31/22 1004  BP: 122/62  Pulse: (!) 59  Temp: 97.7 F (36.5 C)  SpO2: 97%  Weight: 143 lb (64.9 kg)  Height: 5' 1.5" (1.562 m)   Body mass index is 26.58 kg/m.  General appearance: alert, no distress, WD/WN,  female HEENT: normocephalic, sclerae anicteric, TMs pearly, nares patent, no discharge or erythema, pharynx normal Oral cavity: MMM, no lesions, dentures Neck: supple, no lymphadenopathy, no thyromegaly, no masses Heart: RRR, normal S1, S2, no murmurs Lungs: CTA bilaterally, no wheezes, rhonchi. She does have some coarse bronchial sounds bilaterally and rales scattered. Abdomen: +bs, rounded,, soft, non tender, no palpable masses, no hepatomegaly, no splenomegaly Musculoskeletal: nontender, no swelling, no obvious deformity. Extremities: no edema, no cyanosis, no clubbing. She does have scattered small ecchymoses Pulses: 2+ symmetric, upper and lower extremities, normal cap refill Skin: warm and dry.  Ecchymoses of arms and legs. Fingernails and toenail are showing some improvement Neurological: alert, oriented x 3, CN2-12 intact, strength 5/5 upper extremities and lower extremities, DTRs 2+ throughout, no cerebellar signs, gait slow steady with cane Psychiatric: normal affect, behavior normal, pleasant   EKG: defer done 09/12/22 at cardiology AAA: < 3 cm  Raynelle Dick, NP   10/31/2022

## 2022-10-25 ENCOUNTER — Encounter: Payer: Self-pay | Admitting: Gastroenterology

## 2022-10-31 ENCOUNTER — Ambulatory Visit (INDEPENDENT_AMBULATORY_CARE_PROVIDER_SITE_OTHER): Payer: PPO | Admitting: Nurse Practitioner

## 2022-10-31 ENCOUNTER — Encounter: Payer: Self-pay | Admitting: Nurse Practitioner

## 2022-10-31 VITALS — BP 122/62 | HR 59 | Temp 97.7°F | Ht 61.5 in | Wt 143.0 lb

## 2022-10-31 DIAGNOSIS — J42 Unspecified chronic bronchitis: Secondary | ICD-10-CM

## 2022-10-31 DIAGNOSIS — Z79899 Other long term (current) drug therapy: Secondary | ICD-10-CM

## 2022-10-31 DIAGNOSIS — E559 Vitamin D deficiency, unspecified: Secondary | ICD-10-CM

## 2022-10-31 DIAGNOSIS — Z1329 Encounter for screening for other suspected endocrine disorder: Secondary | ICD-10-CM | POA: Diagnosis not present

## 2022-10-31 DIAGNOSIS — Z13220 Encounter for screening for lipoid disorders: Secondary | ICD-10-CM | POA: Diagnosis not present

## 2022-10-31 DIAGNOSIS — K219 Gastro-esophageal reflux disease without esophagitis: Secondary | ICD-10-CM | POA: Diagnosis not present

## 2022-10-31 DIAGNOSIS — I10 Essential (primary) hypertension: Secondary | ICD-10-CM | POA: Diagnosis not present

## 2022-10-31 DIAGNOSIS — K440 Diaphragmatic hernia with obstruction, without gangrene: Secondary | ICD-10-CM

## 2022-10-31 DIAGNOSIS — Z0000 Encounter for general adult medical examination without abnormal findings: Secondary | ICD-10-CM

## 2022-10-31 DIAGNOSIS — K5901 Slow transit constipation: Secondary | ICD-10-CM

## 2022-10-31 DIAGNOSIS — N1831 Chronic kidney disease, stage 3a: Secondary | ICD-10-CM

## 2022-10-31 DIAGNOSIS — J4520 Mild intermittent asthma, uncomplicated: Secondary | ICD-10-CM

## 2022-10-31 DIAGNOSIS — I700 Atherosclerosis of aorta: Secondary | ICD-10-CM

## 2022-10-31 DIAGNOSIS — M810 Age-related osteoporosis without current pathological fracture: Secondary | ICD-10-CM

## 2022-10-31 DIAGNOSIS — M48062 Spinal stenosis, lumbar region with neurogenic claudication: Secondary | ICD-10-CM

## 2022-10-31 DIAGNOSIS — R7309 Other abnormal glucose: Secondary | ICD-10-CM | POA: Diagnosis not present

## 2022-10-31 DIAGNOSIS — G894 Chronic pain syndrome: Secondary | ICD-10-CM

## 2022-10-31 DIAGNOSIS — B351 Tinea unguium: Secondary | ICD-10-CM

## 2022-10-31 DIAGNOSIS — K222 Esophageal obstruction: Secondary | ICD-10-CM

## 2022-10-31 DIAGNOSIS — D692 Other nonthrombocytopenic purpura: Secondary | ICD-10-CM

## 2022-10-31 DIAGNOSIS — Z136 Encounter for screening for cardiovascular disorders: Secondary | ICD-10-CM

## 2022-10-31 DIAGNOSIS — E538 Deficiency of other specified B group vitamins: Secondary | ICD-10-CM | POA: Diagnosis not present

## 2022-10-31 DIAGNOSIS — F3341 Major depressive disorder, recurrent, in partial remission: Secondary | ICD-10-CM

## 2022-10-31 DIAGNOSIS — M8580 Other specified disorders of bone density and structure, unspecified site: Secondary | ICD-10-CM

## 2022-10-31 DIAGNOSIS — F419 Anxiety disorder, unspecified: Secondary | ICD-10-CM

## 2022-10-31 DIAGNOSIS — D472 Monoclonal gammopathy: Secondary | ICD-10-CM

## 2022-10-31 DIAGNOSIS — Z981 Arthrodesis status: Secondary | ICD-10-CM

## 2022-10-31 DIAGNOSIS — I2510 Atherosclerotic heart disease of native coronary artery without angina pectoris: Secondary | ICD-10-CM

## 2022-10-31 DIAGNOSIS — Z0001 Encounter for general adult medical examination with abnormal findings: Secondary | ICD-10-CM

## 2022-10-31 NOTE — Patient Instructions (Signed)
GENERAL HEALTH GOALS   Know what a healthy weight is for you (roughly BMI <25) and aim to maintain this   Aim for 7+ servings of fruits and vegetables daily   70-80+ fluid ounces of water or unsweet tea for healthy kidneys   Limit to max 1 drink of alcohol per day; avoid smoking/tobacco   Limit animal fats in diet for cholesterol and heart health - choose grass fed whenever available   Avoid highly processed foods, and foods high in saturated/trans fats   Aim for low stress - take time to unwind and care for your mental health   Aim for 150 min of moderate intensity exercise weekly for heart health, and weights twice weekly for bone health   Aim for 7-9 hours of sleep daily  

## 2022-11-01 LAB — COMPLETE METABOLIC PANEL WITH GFR
AG Ratio: 1.7 (calc) (ref 1.0–2.5)
ALT: 17 U/L (ref 6–29)
AST: 16 U/L (ref 10–35)
Albumin: 4.3 g/dL (ref 3.6–5.1)
Alkaline phosphatase (APISO): 65 U/L (ref 37–153)
BUN/Creatinine Ratio: 15 (calc) (ref 6–22)
BUN: 16 mg/dL (ref 7–25)
CO2: 26 mmol/L (ref 20–32)
Calcium: 9.8 mg/dL (ref 8.6–10.4)
Chloride: 104 mmol/L (ref 98–110)
Creat: 1.06 mg/dL — ABNORMAL HIGH (ref 0.60–0.95)
Globulin: 2.6 g/dL (calc) (ref 1.9–3.7)
Glucose, Bld: 95 mg/dL (ref 65–99)
Potassium: 4.8 mmol/L (ref 3.5–5.3)
Sodium: 140 mmol/L (ref 135–146)
Total Bilirubin: 0.5 mg/dL (ref 0.2–1.2)
Total Protein: 6.9 g/dL (ref 6.1–8.1)
eGFR: 52 mL/min/{1.73_m2} — ABNORMAL LOW (ref >=60)

## 2022-11-01 LAB — HEMOGLOBIN A1C
Hgb A1c MFr Bld: 6 % of total Hgb — ABNORMAL HIGH (ref <5.7)
Mean Plasma Glucose: 126 mg/dL
eAG (mmol/L): 7 mmol/L

## 2022-11-01 LAB — MAGNESIUM: Magnesium: 1.7 mg/dL (ref 1.5–2.5)

## 2022-11-01 LAB — URINALYSIS, ROUTINE W REFLEX MICROSCOPIC
Bacteria, UA: NONE SEEN /HPF
Bilirubin Urine: NEGATIVE
Glucose, UA: NEGATIVE
Hgb urine dipstick: NEGATIVE
Leukocytes,Ua: NEGATIVE
Nitrite: NEGATIVE
Specific Gravity, Urine: 1.021 (ref 1.001–1.035)
Squamous Epithelial / HPF: NONE SEEN /HPF (ref <=5)
pH: 6 (ref 5.0–8.0)

## 2022-11-01 LAB — CBC WITH DIFFERENTIAL/PLATELET
Absolute Monocytes: 791 cells/uL (ref 200–950)
Basophils Absolute: 77 cells/uL (ref 0–200)
Basophils Relative: 0.9 %
Eosinophils Absolute: 413 cells/uL (ref 15–500)
Eosinophils Relative: 4.8 %
HCT: 43.6 % (ref 35.0–45.0)
Hemoglobin: 14.5 g/dL (ref 11.7–15.5)
Lymphs Abs: 2133 cells/uL (ref 850–3900)
MCH: 31.3 pg (ref 27.0–33.0)
MCHC: 33.3 g/dL (ref 32.0–36.0)
MCV: 94.2 fL (ref 80.0–100.0)
MPV: 11.4 fL (ref 7.5–12.5)
Monocytes Relative: 9.2 %
Neutro Abs: 5186 cells/uL (ref 1500–7800)
Neutrophils Relative %: 60.3 %
Platelets: 320 10*3/uL (ref 140–400)
RBC: 4.63 10*6/uL (ref 3.80–5.10)
RDW: 12.6 % (ref 11.0–15.0)
Total Lymphocyte: 24.8 %
WBC: 8.6 10*3/uL (ref 3.8–10.8)

## 2022-11-01 LAB — LIPID PANEL
Cholesterol: 146 mg/dL (ref <200)
HDL: 67 mg/dL (ref >=50)
LDL Cholesterol (Calc): 62 mg/dL (calc)
Non-HDL Cholesterol (Calc): 79 mg/dL (calc) (ref <130)
Total CHOL/HDL Ratio: 2.2 (calc) (ref <5.0)
Triglycerides: 90 mg/dL (ref <150)

## 2022-11-01 LAB — VITAMIN B12: Vitamin B-12: >2000 pg/mL — ABNORMAL HIGH (ref 200–1100)

## 2022-11-01 LAB — MICROALBUMIN / CREATININE URINE RATIO
Creatinine, Urine: 186 mg/dL (ref 20–275)
Microalb Creat Ratio: 12 mg/g creat (ref <30)
Microalb, Ur: 2.3 mg/dL

## 2022-11-01 LAB — TSH: TSH: 0.85 mIU/L (ref 0.40–4.50)

## 2022-11-01 LAB — VITAMIN D 25 HYDROXY (VIT D DEFICIENCY, FRACTURES): Vit D, 25-Hydroxy: 77 ng/mL (ref 30–100)

## 2022-11-02 NOTE — Progress Notes (Signed)
Patient is aware of lab results and instructions. -e welch

## 2022-11-16 ENCOUNTER — Other Ambulatory Visit: Payer: Self-pay | Admitting: Internal Medicine

## 2022-11-21 ENCOUNTER — Other Ambulatory Visit: Payer: Self-pay | Admitting: Nurse Practitioner

## 2022-11-21 DIAGNOSIS — R110 Nausea: Secondary | ICD-10-CM

## 2022-12-02 DIAGNOSIS — M546 Pain in thoracic spine: Secondary | ICD-10-CM | POA: Diagnosis not present

## 2022-12-06 ENCOUNTER — Other Ambulatory Visit: Payer: Self-pay | Admitting: Orthopedic Surgery

## 2022-12-06 DIAGNOSIS — M546 Pain in thoracic spine: Secondary | ICD-10-CM

## 2022-12-16 ENCOUNTER — Other Ambulatory Visit: Payer: Self-pay | Admitting: Nurse Practitioner

## 2022-12-16 ENCOUNTER — Other Ambulatory Visit: Payer: Self-pay | Admitting: Internal Medicine

## 2022-12-16 DIAGNOSIS — R110 Nausea: Secondary | ICD-10-CM

## 2022-12-19 DIAGNOSIS — M5416 Radiculopathy, lumbar region: Secondary | ICD-10-CM | POA: Diagnosis not present

## 2022-12-19 DIAGNOSIS — M4316 Spondylolisthesis, lumbar region: Secondary | ICD-10-CM | POA: Diagnosis not present

## 2022-12-20 ENCOUNTER — Other Ambulatory Visit: Payer: Self-pay | Admitting: Neurological Surgery

## 2022-12-20 DIAGNOSIS — M4316 Spondylolisthesis, lumbar region: Secondary | ICD-10-CM

## 2022-12-25 ENCOUNTER — Other Ambulatory Visit: Payer: PPO

## 2022-12-29 ENCOUNTER — Ambulatory Visit (INDEPENDENT_AMBULATORY_CARE_PROVIDER_SITE_OTHER): Payer: PPO | Admitting: Nurse Practitioner

## 2022-12-29 ENCOUNTER — Encounter: Payer: Self-pay | Admitting: Nurse Practitioner

## 2022-12-29 ENCOUNTER — Other Ambulatory Visit: Payer: Self-pay

## 2022-12-29 VITALS — BP 108/68 | HR 82 | Temp 97.5°F | Ht 61.5 in | Wt 142.2 lb

## 2022-12-29 DIAGNOSIS — Z1152 Encounter for screening for COVID-19: Secondary | ICD-10-CM

## 2022-12-29 DIAGNOSIS — J42 Unspecified chronic bronchitis: Secondary | ICD-10-CM

## 2022-12-29 DIAGNOSIS — I10 Essential (primary) hypertension: Secondary | ICD-10-CM

## 2022-12-29 LAB — POC COVID19 BINAXNOW: SARS Coronavirus 2 Ag: NEGATIVE

## 2022-12-29 MED ORDER — PROMETHAZINE HCL 25 MG PO TABS
25.0000 mg | ORAL_TABLET | Freq: Four times a day (QID) | ORAL | 1 refills | Status: DC | PRN
Start: 2022-12-29 — End: 2023-02-13

## 2022-12-29 MED ORDER — DEXAMETHASONE 1 MG PO TABS
ORAL_TABLET | ORAL | 0 refills | Status: DC
Start: 2022-12-29 — End: 2023-02-13

## 2022-12-29 MED ORDER — AZITHROMYCIN 250 MG PO TABS
ORAL_TABLET | ORAL | 1 refills | Status: DC
Start: 2022-12-29 — End: 2023-02-12

## 2022-12-29 NOTE — Progress Notes (Signed)
Assessment and Plan:  Shandell was seen today for acute visit.  Diagnoses and all orders for this visit:  Encounter for screening for COVID-19 -     POC COVID-19- negative  Essential hypertension Continue Amlodipine 10 mg every day Will continue to hold Metoprolol and monitor BP and Fatigue - continue DASH diet, exercise and monitor at home. Call if greater than 130/80.    Chronic bronchitis, unspecified chronic bronchitis type (HCC) Begin zithromax as directed Begin dexamethasone as directed Continue regular use of inhalers Mucinex as needed to thin mucus secretions If no improvement in the next week notify the office If gets where she feels like she cannot get a deep breath go to ER -     azithromycin (ZITHROMAX) 250 MG tablet; Take 2 tablets (500 mg) on  Day 1,  followed by 1 tablet (250 mg) once daily on Days 2 through 5. -     dexamethasone (DECADRON) 1 MG tablet; Take 3 tabs for 3 days, 2 tabs for 3 days 1 tab for 5 days. Take with food. -     PR NEBULIZER ADMINISTRATION SET -     PR ALBUTEROL IPRATROP NON-COMP -     PR PRESSURIZED/NONPRESSURIZED INHALATION TREATMENT -     For home use only DME Nebulizer machine  Nausea Promethazine 25 mg every 6 hours as needed           Further disposition pending results of labs. Discussed med's effects and SE's.   Over 30 minutes of exam, counseling, chart review, and critical decision making was performed.   Future Appointments  Date Time Provider Department Center  01/17/2023  7:00 AM [facility] 3 [facility]  02/13/2023 11:00 AM Lucky Cowboy, MD [facility] None  06/02/2023 11:00 AM Lucky Cowboy, MD [facility] None  10/31/2023 10:00 AM Raynelle Dick, NP [facility] None    ------------------------------------------------------------------------------------------------------------------   HPI BP 108/68   Pulse 82   Temp (!) 97.5 F (36.4 C)   Ht 5' 1.5" (1.562 m)   Wt 142 lb 3.2 oz (64.5 kg)    SpO2 95%   BMI 26.43 kg/m   87 y.o.female presents for fatigue and some sinus congestion. Has been clearing her throat a lot and coughing up phlegm that is clear. Nasal congestion with clear mucus. Denies fevers, body aches, diarrhea, sore throat and fevers. She stopped her metoprolol and her nausea has improved.    She is having more wheezing this morning. Using her Dulera 2 puffs bid and Albuterol as needed. Has chronic bronchitis. Dry cough noted today with wheezing.    BP well controlled with Amlodipine 10 mg . Stopped Metoprolol because she felt it was making her nauseated, has been off x 2-3 days.  Since stopping nausea has improved. BP is still running on low side since stopping Metoprolol which can be cause of fatigue  BP Readings from Last 3 Encounters:  12/29/22 108/68  10/31/22 122/62  10/19/22 (!) 150/74  Denies headaches, chest pain and dizziness  BMI is Body mass index is 26.43 kg/m., she has been working on diet and exercise. Wt Readings from Last 3 Encounters:  12/29/22 142 lb 3.2 oz (64.5 kg)  10/31/22 143 lb (64.9 kg)  10/19/22 148 lb (67.1 kg)     Past Medical History:  Diagnosis Date   Anxiety    takes Xanax daily as needed   Asthma    Albuterol daily as needed   Blood transfusion without reported diagnosis  CAD (coronary artery disease)    LHC 2003 with 40% pLAD, 30% mLAD, 100% D1 (moderate vessel), 100%  D2 (small vessel), 95% mid small OM2.  Pt had PTCA to D1;  D2 and OM2 small vessels and tx medically;  Last Myoview (2012): anterior infarct seen with scar, no ischemia. EF normal. Med Rx // Myoview (12/14):  Low risk; ant scar w/ peri-infarct ischemia; EF 55% with ant AK // Myoview 5/16: EF 55-65, ant, apical scar, no ischemia    Cataract    Chronic back pain    HNP, DDD, spinal stenosis, vertebral compression fractures.    Chronic nausea    takes Zofran daily as needed.  normal gastric emptying study 03/2013   CKD (chronic kidney disease), stage III  (HCC) 09/27/2017   Constipation    felt to be functional. takes Senokot and Miralax daily.  treated with Linzess in past.    DIVERTICULOSIS, COLON 08/28/2008   Qualifier: Diagnosis of  By: Bascom Levels, [name], [name]     Elevated LFTs 2015   probably med induced DILI, from Augmentin vs Pravastatin   GERD (gastroesophageal reflux disease)    hx of esophageal stricture    Hiatal hernia    Paraesophageal hernias. s/p fundoplications in 2013 and 04/2013   History of bronchitis several of yrs ago   History of colon polyps 03/2009, 03/2011   adenomatous colon polyps, no high grade dysplasia.    History of vertigo    no meds   Hyperlipidemia    takes Pravastatin daily   Hypertension    takes Amlodipine,Metoprolol,and Losartan daily   Multiple closed pelvic fractures without disruption of pelvic circle (HCC)    Nausea 03/2016   Osteoporosis    PONV (postoperative nausea and vomiting)    yrs ago   Pubic ramus fracture (HCC) 12/20/2019   Slow urinary stream    takes Rapaflo daily   TIA (transient ischemic attack) 1995   no residual effects noted   Vertebral compression fracture (HCC) 06/23/2012     Allergies  Allergen Reactions   Ace Inhibitors Cough    Per Dr Sherene Sires pulmonology 2013   Aspartame Diarrhea and Nausea And Vomiting   Atorvastatin Other (See Comments)    Muscle aches   Biaxin [Clarithromycin] Other (See Comments)    Unknown reaction : PATIENT CAN TOLERATE Z-PAK.  Is written on patient's paper chart.   Daliresp [Roflumilast] Nausea And Vomiting   Erythromycin Other (See Comments)    Unknown reaction: PATIENT CAN TOLERATE Z-PAK.  It is written on patient's paper chart.   Levofloxacin Nausea And Vomiting    Current Outpatient Medications on File Prior to Visit  Medication Sig   albuterol (PROVENTIL) (2.5 MG/3ML) 0.083% nebulizer solution Take 3 mLs (2.5 mg total) by nebulization every 4 (four) hours as needed for wheezing or shortness of breath.   albuterol (VENTOLIN HFA) 108 (90  Base) MCG/ACT inhaler INHALE 2 PUFFS BY MOUTH EVERY 4 HOURS AS NEEDED FOR WHEEZING FOR SHORTNESS OF BREATH   amLODipine (NORVASC) 10 MG tablet Take  1 tablet Daikly for BP & Heart                                                                        /  TAKE                                         BY                                                 MOUTH   aspirin EC 81 MG tablet Take 81 mg by mouth at bedtime. Every other night   budesonide (PULMICORT) 0.25 MG/2ML nebulizer solution Take 2 mLs (0.25 mg total) by nebulization 4 (four) times daily.   busPIRone (BUSPAR) 10 MG tablet Take 1/2 to 1 tablet 3 x /day if Needed for Anxiety or Panic Attack  (( Replaces Xanax ))   Cholecalciferol (VITAMIN D3) 5000 units CAPS Take 5,000 Units by mouth 2 (two) times daily.    gabapentin (NEURONTIN) 600 MG tablet Take  1/2 to 1 tablet  3 to 4 x /Daily  as needed for Pain   ketoconazole (NIZORAL) 2 % cream Apply to  Ringworm rash  2 x /day   Magnesium 400 MG TABS Take 400 mg by mouth daily at 2 PM.   melatonin 5 MG TABS Take 5 mg by mouth at bedtime as needed.   mometasone-formoterol (DULERA) 200-5 MCG/ACT AERO Take 2 puffs first thing in am and then another 2 puffs about 12 hours later.   nitroGLYCERIN (NITROSTAT) 0.4 MG SL tablet Place 1 tablet (0.4 mg total) under the tongue every 5 (five) minutes as needed for chest pain.   senna-docusate (SENOKOT-S) 8.6-50 MG tablet Take 2 tablets by mouth 2 (two) times daily.   solifenacin (VESICARE) 5 MG tablet Take 5 mg by mouth daily.   sucralfate (CARAFATE) 1 g tablet Take 1 g by mouth 4 (four) times daily -  with meals and at bedtime.   vitamin B-12 (CYANOCOBALAMIN) 1000 MCG tablet Take 5,000 mcg by mouth daily.   anti-nausea (EMETROL) solution Take 10 mLs by mouth every 15 (fifteen) minutes as needed for nausea or vomiting. (Patient not taking: Reported on 12/29/2022)   metoprolol tartrate (LOPRESSOR) 50  MG tablet TAKE 1 TABLET BY MOUTH TWICE DAILY (EVERY 12 HOURS) FOR BLOOD PRESSURE AND HEART (Patient not taking: Reported on 12/29/2022)   Multiple Vitamins-Minerals (MULTIVITAMIN WITH MINERALS) tablet Take 1 tablet by mouth daily. (Patient not taking: Reported on 12/29/2022)   No current facility-administered medications on file prior to visit.    ROS: all negative except above.   Physical Exam:  BP 108/68   Pulse 82   Temp (!) 97.5 F (36.4 C)   Ht 5' 1.5" (1.562 m)   Wt 142 lb 3.2 oz (64.5 kg)   SpO2 95%   BMI 26.43 kg/m   General Appearance: Pleasant female who appears fatigued Eyes: PERRLA, EOMs, conjunctiva no swelling or erythema Sinuses: No Frontal/maxillary tenderness ENT/Mouth: Ext aud canals clear, TMs without erythema, bulging. No erythema, swelling, or exudate on post pharynx.   Hearing normal.  Neck: Supple, thyroid normal.  Respiratory: Respiratory effort normal, expiratory wheezes throughout lungs bilaterally Cardio: RRR with no MRGs. Brisk peripheral pulses without edema.  Abdomen: Soft, + BS.  Non tender, no guarding, rebound, hernias, masses. Lymphatics: Non tender without lymphadenopathy.  Musculoskeletal: Full ROM, 5/5 strength, normal gait.  Skin: Warm, dry without rashes, lesions, ecchymosis.  Neuro:  Cranial nerves intact. Normal muscle tone, no cerebellar symptoms. Sensation intact.  Psych: Awake and oriented X 3, normal affect, Insight and Judgment appropriate.     Raynelle Dick, NP 10:30 AM [facility]

## 2022-12-29 NOTE — Patient Instructions (Signed)
Begin zithromax as directed Begin dexamethasone as directed Continue regular use of inhalers Mucinex as needed to thin mucus secretions  Chronic Bronchitis, Adult  Chronic bronchitis is inflammation inside of the main airways (bronchi) that come off the windpipe (trachea) in the lungs. The swelling causes the airways to narrow and make more mucus than normal. This can make it hard to breathe and may cause coughing or noisy breathing (wheezing). This condition is a type of chronic obstructive pulmonary disease (COPD). Chronic bronchitis is often associated with other chronic respiratory conditions, such as emphysema, asthma, bronchiectasis, or cystic fibrosis. Chronic bronchitis is a long-term (chronic) condition. It is defined as a chronic cough with mucus (sputum) production: For at least 3 months of the year. For 2 years in a row. People with chronic bronchitis are more likely to get colds and other infections in the nose, throat, or airways. What are the causes? This condition is most often caused by: A history of smoking. Exposure to secondhand smoke or a smoky area for a long period of time. Frequent lung infections. Long-term exposure to certain fumes or chemicals that irritate the lungs. What are the signs or symptoms? Symptoms of chronic bronchitis may include: A cough that brings up mucus (productive cough). A whistling sound when you breathe (wheezing). Shortness of breath. Chest tightness or soreness. Fever or chills. Colds or respiratory infections that go away and return. How is this diagnosed? Your health care provider may diagnose this condition based on your signs and symptoms, especially if you have a cough that lasts a long time or keeps coming back. This condition may be diagnosed based on: Your symptoms and medical history. A physical exam, including listening to your lungs. Tests, such as: Testing a sputum sample. Blood tests. A chest X-ray. Tests of lung  (pulmonary) function. How is this treated? There is no cure for chronic bronchitis. Treatment may help control your symptoms. This includes: Drinking fluids. This may help thin your mucus so it is easier to cough up. Mucus-clearing techniques. Your health care provider will show you which techniques are best for you. Medicines such as: Inhaled medicine (inhaler) to improve air flow in and out of your lungs. Antibiotics to treat or prevent bacterial lung infections. Mucus-thinning medicines. Pulmonary rehabilitation. This is a program that helps you learn how to manage your breathing problem. The program may include exercise, education, counseling, treatment, and support. Using oxygen therapy, if your blood oxygen level is very low. Follow these instructions at home: Medicines Take over-the-counter and prescription medicines only as told by your health care provider. If you were prescribed an antibiotic medicine, take it as told by your health care provider. Do not stop taking the antibiotic even if you start to feel better. Lifestyle  Do not use any products that contain nicotine or tobacco. These products include cigarettes, chewing tobacco, and vaping devices, such as e-cigarettes. If you need help quitting, ask your health care provider. Stay away from other people's smoke (secondhand smoke) and any irritants that make you cough more, such as chemical fumes. Eat a healthy diet and get regular exercise. Talk with your health care provider about what activities are safe for you. Return to normal activities as told by your health care provider. Ask your health care provider what activities are safe for you. Preventing infections Stay up to date on all immunizations, including the pneumonia and flu vaccines. Wash your hands often with soap and water for at least 20 seconds. If soap and  water are not available, use hand sanitizer. Avoid contact with people who have symptoms of a cold or the  flu. Keep your environment free from any known allergens such as dust, mold, pets, and pollen. General instructions Get plenty of rest. Drink enough fluids to keep your urine pale yellow. Use oxygen therapy at home as directed. Follow instructions from your health care provider about how to use oxygen safely and take steps to prevent fire. Do not smoke while using oxygen or allow others to smoke in your home. Keep all follow-up visits. This is important. Contact a health care provider if: Your shortness of breath or coughing gets worse even when you take medicine. Your mucus gets thicker or changes color. You are not able to cough up your mucus. You have a fever. Get help right away if: You cough up blood. You have trouble breathing. You have chest pain. You feel dizzy or confused. These symptoms may represent a serious problem that is an emergency. Do not wait to see if the symptoms will go away. Get medical help right away. Call your local emergency services (911 in the [place] Do not drive yourself to the hospital. Summary Chronic bronchitis is inflammation inside of the main airways (bronchi) that come off the windpipe (trachea) in the lungs. The swelling causes the airways to narrow and make more mucus than normal. Chronic bronchitis is a long-term (chronic) condition. It is defined as a chronic cough with mucus (sputum) production for at least 3 months of the year for 2 years in a row. If you were prescribed an antibiotic medicine, take it as told by your health care provider. Do not stop taking the antibiotic even if you start to feel better. Do not use any products that contain nicotine or tobacco. These products include cigarettes, chewing tobacco, and vaping devices, such as e-cigarettes. If you need help quitting, ask your health care provider. This information is not intended to replace advice given to you by your health care provider. Make sure you discuss any questions you have  with your health care provider. Document Revised: 08/26/2020 Document Reviewed: 08/26/2020 Elsevier Patient Education  2024 [employer].

## 2023-01-17 ENCOUNTER — Ambulatory Visit
Admission: RE | Admit: 2023-01-17 | Discharge: 2023-01-17 | Disposition: A | Discharge status: Home / Self Care | Payer: PPO | Source: Ambulatory Visit | Attending: Neurological Surgery | Admitting: Neurological Surgery

## 2023-01-17 DIAGNOSIS — L538 Other specified erythematous conditions: Secondary | ICD-10-CM | POA: Diagnosis not present

## 2023-01-17 DIAGNOSIS — M4316 Spondylolisthesis, lumbar region: Secondary | ICD-10-CM

## 2023-01-17 DIAGNOSIS — L820 Inflamed seborrheic keratosis: Secondary | ICD-10-CM | POA: Diagnosis not present

## 2023-01-17 DIAGNOSIS — M47816 Spondylosis without myelopathy or radiculopathy, lumbar region: Secondary | ICD-10-CM | POA: Diagnosis not present

## 2023-01-18 ENCOUNTER — Other Ambulatory Visit: Payer: Self-pay | Admitting: Internal Medicine

## 2023-01-18 DIAGNOSIS — B372 Candidiasis of skin and nail: Secondary | ICD-10-CM

## 2023-01-20 DIAGNOSIS — N3946 Mixed incontinence: Secondary | ICD-10-CM | POA: Diagnosis not present

## 2023-01-23 ENCOUNTER — Ambulatory Visit (INDEPENDENT_AMBULATORY_CARE_PROVIDER_SITE_OTHER): Payer: PPO | Admitting: Nurse Practitioner

## 2023-01-23 ENCOUNTER — Encounter: Payer: Self-pay | Admitting: Nurse Practitioner

## 2023-01-23 VITALS — BP 150/70 | HR 75 | Temp 97.9°F | Resp 16 | Ht 61.5 in | Wt 148.0 lb

## 2023-01-23 DIAGNOSIS — I10 Essential (primary) hypertension: Secondary | ICD-10-CM

## 2023-01-23 DIAGNOSIS — R110 Nausea: Secondary | ICD-10-CM | POA: Diagnosis not present

## 2023-01-23 DIAGNOSIS — Z79899 Other long term (current) drug therapy: Secondary | ICD-10-CM | POA: Diagnosis not present

## 2023-01-23 MED ORDER — PROPRANOLOL HCL 20 MG PO TABS: 20.0000 mg | ORAL_TABLET | Freq: Two times a day (BID) | ORAL | 11 refills | Status: DC

## 2023-01-23 NOTE — Progress Notes (Unsigned)
Assessment and Plan:  [name] [name] was seen today for an episodic visit.  Diagnoses and all order for this visit:  Essential hypertension Change Metoprolol to Propranolol Continue to monitor for nausea Discussed DASH (Dietary Approaches to Stop Hypertension) DASH diet is lower in sodium than a typical American diet. Cut back on foods that are high in saturated fat, cholesterol, and trans fats. Eat more whole-grain foods, fish, poultry, and nuts Remain active and exercise as tolerated daily.  Monitor BP at home-Call if greater than 130/80.  Check CMP/CBC  Nausea without vomiting Take medication with food Continue Zofran PRN  Medication management All medications discussed and reviewed in full. All questions and concerns regarding medications addressed.    Meds ordered this encounter  Medications   propranolol (INDERAL) 20 MG tablet    Sig: Take 1 tablet (20 mg total) by mouth 2 (two) times daily.    Dispense:  60 tablet    Refill:  11    Order Specific Question:   Supervising Provider    Answer:   Lucky Cowboy [phone]   Notify office for further evaluation and treatment, questions or concerns if s/s fail to improve. The risks and benefits of my recommendations, as well as other treatment options were discussed with the patient today. Questions were answered.  Further disposition pending results of labs. Discussed med's effects and SE's.    Over 20 minutes of exam, counseling, chart review, and critical decision making was performed.   Future Appointments  Date Time Provider Department Center  02/13/2023 11:00 AM Lucky Cowboy, MD [facility] None  03/09/2023 10:30 AM Nyoka Cowden, MD [facility] None  06/02/2023 11:00 AM Lucky Cowboy, MD [facility] None  10/31/2023 10:00 AM Raynelle Dick, NP [facility] None    ------------------------------------------------------------------------------------------------------------------   HPI BP (!) 150/70    Pulse 75   Temp 97.9 F (36.6 C)   Resp 16   Ht 5' 1.5" (1.562 m)   Wt 148 lb (67.1 kg)   SpO2 96%   BMI 27.51 kg/m   87 y.o.female presents for evaluation of taking metoprolol. States that she has had increase in nausea.  She did an in home evaluation and felt that metoprolol was causing the nausea.  She stopped this medication which caused her BP to elevate.  She followed up with her Nephrologist and BP continued to remain elevated and she was instructed to restart the Metoprolol. She cut the dose in half but continues to feel as though she has nausea.  She has been taking Zofran with some relief.  She reports that her BP is better controlled since restarting but she does wish to stay on the Metoprolol.  Past Medical History:  Diagnosis Date   Anxiety    takes Xanax daily as needed   Asthma    Albuterol daily as needed   Blood transfusion without reported diagnosis    CAD (coronary artery disease)    LHC 2003 with 40% pLAD, 30% mLAD, 100% D1 (moderate vessel), 100%  D2 (small vessel), 95% mid small OM2.  Pt had PTCA to D1;  D2 and OM2 small vessels and tx medically;  Last Myoview (2012): anterior infarct seen with scar, no ischemia. EF normal. Med Rx // Myoview (12/14):  Low risk; ant scar w/ peri-infarct ischemia; EF 55% with ant AK // Myoview 5/16: EF 55-65, ant, apical scar, no ischemia    Cataract    Chronic back pain    HNP, DDD, spinal stenosis, vertebral compression fractures.  Chronic nausea    takes Zofran daily as needed.  normal gastric emptying study 03/2013   CKD (chronic kidney disease), stage III (HCC) 09/27/2017   Constipation    felt to be functional. takes Senokot and Miralax daily.  treated with Linzess in past.    DIVERTICULOSIS, COLON 08/28/2008   Qualifier: Diagnosis of  By: Bascom Levels, [name], [name]     Elevated LFTs 2015   probably med induced DILI, from Augmentin vs Pravastatin   GERD (gastroesophageal reflux disease)    hx of esophageal stricture    Hiatal  hernia    Paraesophageal hernias. s/p fundoplications in 2013 and 04/2013   History of bronchitis several of yrs ago   History of colon polyps 03/2009, 03/2011   adenomatous colon polyps, no high grade dysplasia.    History of vertigo    no meds   Hyperlipidemia    takes Pravastatin daily   Hypertension    takes Amlodipine,Metoprolol,and Losartan daily   Multiple closed pelvic fractures without disruption of pelvic circle (HCC)    Nausea 03/2016   Osteoporosis    PONV (postoperative nausea and vomiting)    yrs ago   Pubic ramus fracture (HCC) 12/20/2019   Slow urinary stream    takes Rapaflo daily   TIA (transient ischemic attack) 1995   no residual effects noted   Vertebral compression fracture (HCC) 06/23/2012     Allergies  Allergen Reactions   Ace Inhibitors Cough    Per Dr Sherene Sires pulmonology 2013   Aspartame Diarrhea and Nausea And Vomiting   Atorvastatin Other (See Comments)    Muscle aches   Biaxin [Clarithromycin] Other (See Comments)    Unknown reaction : PATIENT CAN TOLERATE Z-PAK.  Is written on patient's paper chart.   Daliresp [Roflumilast] Nausea And Vomiting   Erythromycin Other (See Comments)    Unknown reaction: PATIENT CAN TOLERATE Z-PAK.  It is written on patient's paper chart.   Levofloxacin Nausea And Vomiting    Current Outpatient Medications on File Prior to Visit  Medication Sig   albuterol (PROVENTIL) (2.5 MG/3ML) 0.083% nebulizer solution Take 3 mLs (2.5 mg total) by nebulization every 4 (four) hours as needed for wheezing or shortness of breath.   albuterol (VENTOLIN HFA) 108 (90 Base) MCG/ACT inhaler INHALE 2 PUFFS BY MOUTH EVERY 4 HOURS AS NEEDED FOR WHEEZING FOR SHORTNESS OF BREATH   amLODipine (NORVASC) 10 MG tablet Take  1 tablet Daikly for BP & Heart                                                                        /                                                                   TAKE  BY                                                  MOUTH   anti-nausea (EMETROL) solution Take 10 mLs by mouth every 15 (fifteen) minutes as needed for nausea or vomiting. (Patient not taking: Reported on 12/29/2022)   aspirin EC 81 MG tablet Take 81 mg by mouth at bedtime. Every other night   azithromycin (ZITHROMAX) 250 MG tablet Take 2 tablets (500 mg) on  Day 1,  followed by 1 tablet (250 mg) once daily on Days 2 through 5.   budesonide (PULMICORT) 0.25 MG/2ML nebulizer solution Take 2 mLs (0.25 mg total) by nebulization 4 (four) times daily.   busPIRone (BUSPAR) 10 MG tablet Take 1/2 to 1 tablet 3 x /day if Needed for Anxiety or Panic Attack  (( Replaces Xanax ))   Cholecalciferol (VITAMIN D3) 5000 units CAPS Take 5,000 Units by mouth 2 (two) times daily.    dexamethasone (DECADRON) 1 MG tablet Take 3 tabs for 3 days, 2 tabs for 3 days 1 tab for 5 days. Take with food.   gabapentin (NEURONTIN) 600 MG tablet Take  1/2 to 1 tablet  3 to 4 x /Daily  as needed for Pain   ketoconazole (NIZORAL) 2 % cream APPLY TO RINGWORM  RASH TWICE A DAY   Magnesium 400 MG TABS Take 400 mg by mouth daily at 2 PM.   melatonin 5 MG TABS Take 5 mg by mouth at bedtime as needed.   mometasone-formoterol (DULERA) 200-5 MCG/ACT AERO Take 2 puffs first thing in am and then another 2 puffs about 12 hours later.   Multiple Vitamins-Minerals (MULTIVITAMIN WITH MINERALS) tablet Take 1 tablet by mouth daily. (Patient not taking: Reported on 12/29/2022)   nitroGLYCERIN (NITROSTAT) 0.4 MG SL tablet Place 1 tablet (0.4 mg total) under the tongue every 5 (five) minutes as needed for chest pain.   promethazine (PHENERGAN) 25 MG tablet Take 1 tablet (25 mg total) by mouth every 6 (six) hours as needed for nausea or vomiting.   senna-docusate (SENOKOT-S) 8.6-50 MG tablet Take 2 tablets by mouth 2 (two) times daily.   solifenacin (VESICARE) 5 MG tablet Take 5 mg by mouth daily.   sucralfate (CARAFATE) 1 g tablet Take 1 g by mouth 4 (four) times daily  -  with meals and at bedtime.   vitamin B-12 (CYANOCOBALAMIN) 1000 MCG tablet Take 5,000 mcg by mouth daily.   No current facility-administered medications on file prior to visit.    ROS: all negative except what is noted in the HPI.   Physical Exam:  BP (!) 150/70   Pulse 75   Temp 97.9 F (36.6 C)   Resp 16   Ht 5' 1.5" (1.562 m)   Wt 148 lb (67.1 kg)   SpO2 96%   BMI 27.51 kg/m   General Appearance: NAD.  Awake, conversant and cooperative. Eyes: PERRLA, EOMs intact.  Sclera white.  Conjunctiva without erythema. Sinuses: No frontal/maxillary tenderness.  No nasal discharge. Nares patent.  ENT/Mouth: Ext aud canals clear.  Bilateral TMs w/DOL and without erythema or bulging. Hearing intact.  Posterior pharynx without swelling or exudate.  Tonsils without swelling or erythema.  Neck: Supple.  No masses, nodules or thyromegaly. Respiratory: Effort is regular with non-labored breathing. Breath sounds are equal bilaterally without rales, rhonchi,  wheezing or stridor.  Cardio: RRR with no MRGs. Brisk peripheral pulses without edema.  Abdomen: Active BS in all four quadrants.  Soft and non-tender without guarding, rebound tenderness, hernias or masses. Lymphatics: Non tender without lymphadenopathy.  Musculoskeletal: Full ROM, 5/5 strength, normal ambulation.  No clubbing or cyanosis. Skin: Appropriate color for ethnicity. Warm without rashes, lesions, ecchymosis, ulcers.  Neuro: CN II-XII grossly normal. Normal muscle tone without cerebellar symptoms and intact sensation.   Psych: AO X 3,  appropriate mood and affect, insight and judgment.     Adela Glimpse, NP 1:15 PM [facility]

## 2023-01-24 ENCOUNTER — Encounter: Payer: Self-pay | Admitting: Nurse Practitioner

## 2023-01-24 NOTE — Patient Instructions (Signed)
Propranolol Extended-Release Capsules What is this medication? PROPRANOLOL (proe PRAN oh lole) treats many conditions such as high blood pressure and heart disease. It may also be used to prevent chest pain (angina). It works by lowering your blood pressure and heart rate, making it easier for your heart to pump blood to the rest of your body. It can also be used to prevent migraine headaches. It works by relaxing the blood vessels in the brain that cause migraines. It belongs to a group of medications called beta blockers. This medicine may be used for other purposes; ask your health care provider or pharmacist if you have questions. COMMON BRAND NAME(S): Inderal LA, Inderal XL, InnoPran XL What should I tell my care team before I take this medication? They need to know if you have any of these conditions: Diabetes Having surgery Heart or blood vessel conditions, such as slow heartbeat, heart failure, heart block Kidney disease Liver disease Lung or breathing disease, such as asthma or COPD Myasthenia gravis Pheochromocytoma Thyroid disease An unusual or allergic reaction to propranolol, other medications, foods, dyes, or preservatives Pregnant or trying to get pregnant Breastfeeding How should I use this medication? Take this medication by mouth with water. Take it as directed on the prescription label at the same time every day. Do not cut, crush, or chew this medication. Swallow the capsules whole. You can take it with or without food. If it upsets your stomach, take it with food. Keep taking it unless your care team tells you to stop. Talk to your care team about the use of this medication in children. Special care may be needed. Overdosage: If you think you have taken too much of this medicine contact a poison control center or emergency room at once. NOTE: This medicine is only for you. Do not share this medicine with others. What if I miss a dose? If you miss a dose, take it as soon  as you can. If it is almost time for your next dose, take only that dose. Do not take double or extra doses. What may interact with this medication? Do not take this medication with any of the following: Thioridazine This medication may also interact with the following: Certain medications for blood pressure, heart disease, irregular heartbeat Epinephrine NSAIDs, medications for pain and inflammation, such as ibuprofen or naproxen Warfarin Other medications may affect the way this medication works. Talk with your care team about all of the medications you take. They may suggest changes to your treatment plan to lower the risk of side effects and to make sure your medications work as intended. This list may not describe all possible interactions. Give your health care provider a list of all the medicines, herbs, non-prescription drugs, or dietary supplements you use. Also tell them if you smoke, drink alcohol, or use illegal drugs. Some items may interact with your medicine. What should I watch for while using this medication? Visit your care team for regular checks on your progress. Check your blood pressure as directed. Know what your blood pressure should be and when to contact your care team. This medication may affect your coordination, reaction time, or judgment. Do not drive or operate machinery until you know how this medication affects you. Sit up or stand slowly to reduce the risk of dizzy or fainting spells. Drinking alcohol with this medication can increase the risk of these side effects. Do not suddenly stop taking this medication. This may increase your risk of side effects, such as  chest pain and heart attack. If you no longer need to take this medication, your care team will lower the dose slowly over time to decrease the risk of side effects. If you are going to need surgery or a procedure, tell your care team that you are using this medication. This medication may affect blood  glucose levels. It can also mask the symptoms of low blood sugar, such as a rapid heartbeat and tremors. If you have diabetes, it is important to check your blood sugar often while you are taking this medication. Do not treat yourself for coughs, colds, or pain while you are using this medication without asking your care team for advice. Some medications may increase your blood pressure. What side effects may I notice from receiving this medication? Side effects that you should report to your care team as soon as possible: Allergic reactions--skin rash, itching, hives, swelling of the face, lips, tongue, or throat Heart failure--shortness of breath, swelling of the ankles, feet, or hands, sudden weight gain, unusual weakness or fatigue Low blood pressure--dizziness, feeling faint or lightheaded, blurry vision Raynaud's--cool, numb, or painful fingers or toes that may change color from pale, to blue, to red Redness, blistering, peeling, or loosening of the skin, including inside the mouth Slow heartbeat--dizziness, feeling faint or lightheaded, confusion, trouble breathing, unusual weakness or fatigue Worsening mood, feelings of depression Side effects that usually do not require medical attention (report to your care team if they continue or are bothersome): Change in sex drive or performance Diarrhea Dizziness Fatigue Headache This list may not describe all possible side effects. Call your doctor for medical advice about side effects. You may report side effects to FDA at 1-800-FDA-1088. Where should I keep my medication? Keep out of the reach of children and pets. Store at room temperature between 15 and 30 degrees C (59 and 86 degrees F). Protect from light and moisture. Keep the container tightly closed. Avoid exposure to extreme heat. Do not freeze. Throw away any unused medication after the expiration date. NOTE: This sheet is a summary. It may not cover all possible information. If you  have questions about this medicine, talk to your doctor, pharmacist, or health care provider.  2024 [employer]vier/Gold Standard (2022-04-25 00:00:00)

## 2023-01-30 DIAGNOSIS — M5416 Radiculopathy, lumbar region: Secondary | ICD-10-CM | POA: Diagnosis not present

## 2023-01-30 DIAGNOSIS — M4316 Spondylolisthesis, lumbar region: Secondary | ICD-10-CM | POA: Diagnosis not present

## 2023-01-30 DIAGNOSIS — Z6826 Body mass index (BMI) 26.0-26.9, adult: Secondary | ICD-10-CM | POA: Diagnosis not present

## 2023-02-09 ENCOUNTER — Other Ambulatory Visit: Payer: Self-pay | Admitting: Neurological Surgery

## 2023-02-09 DIAGNOSIS — M5416 Radiculopathy, lumbar region: Secondary | ICD-10-CM

## 2023-02-12 ENCOUNTER — Encounter: Payer: Self-pay | Admitting: Internal Medicine

## 2023-02-12 NOTE — Progress Notes (Unsigned)
Future Appointments  Date Time Provider Department  02/13/2023 11:00 AM Lucky Cowboy, MD [facility]  03/09/2023 10:30 AM Nyoka Cowden, MD [facility]  06/02/2023 11:00 AM Lucky Cowboy, MD [facility]  10/31/2023 10:00 AM Raynelle Dick, NP [facility]    History of Present Illness:      This very nice  MWF  presents for 3  month follow up with HTN, CKD3a, HLD, GERD, Pre-Diabetes and Vitamin D Deficiency.  Lumbar CT scan on 01/29/2021 showed Aortic & Coronary Art Atherosclerosis.         Patient is treated for HTN (1988) & BP has been controlled at home. Today's BP is at goal - 130/68 . Patient has had no complaints of any cardiac type chest pain, palpitations, dyspnea / orthopnea / PND, dizziness, claudication, or dependent edema.        Hyperlipidemia is controlled with diet.  Last Lipids were at goal :                                      Lab Results  Component Value Date   CHOL 146 10/31/2022   HDL 67 10/31/2022   LDLCALC 62 10/31/2022   TRIG 90 10/31/2022   CHOLHDL 2.2 10/31/2022     Also, the patient has history of PreDiabetes (A1c 6.1% / 2013) and has had no symptoms of reactive hypoglycemia, diabetic polys, paresthesias or visual blurring.  Last A1c was near goal :  Lab Results  Component Value Date   HGBA1C 6.0 (H) 10/31/2022     Further, the patient also has history of Vitamin D Deficiency  ("25" /2008) and supplements vitamin D without any suspected side-effects. Last vitamin D was at goal :  Lab Results  Component Value Date   VD25OH 77 10/31/2022       Current Outpatient Medications  Medication Instructions   albuterol 2.5 mg   Neb   Every 4 hours PRN   albuterol  HFA inhaler  2 PUFFS  EVERY 6 HRS AS NEEDED    ALPRAZolam  0.5 MG tablet Take 1/2 tablet 1- 2 x /day ONLY if needed    amLODipine 10 MG tablet Take   1 tablet  Daily    aspirin EC  81 mg Every other night   SYMBICORT 160-4.5  inhaler Take 2 puffs  am and 12 hours later.     VITAMIN B12   5,000 mcg    Daily   DULoxetine  30 MG capsule TAKE 1 CAPSULE DAILY   NEURONTIN  400 mg,  daily   ketoconazole  2 % cream Apply to  Ringworm rash  2 x /day   Magnesium  400 mg Daily   Melatonin  5 mg At bedtime PRN   metoprolol tartrate 50 MG tablet Take 1 tablet  2 x /day (every 12 hours)   NITROSTAT   0.4 mg Sublingual  Every 5 min PRN   omeprazole 40 MG capsule Take  1 capsule 2 x /day   promethazine 25 MG tablet Take  1 tablet  4 x/day  if needed    SENOKOT-S  2 tablets   2 times daily   sucralfate 1 g tablet TAKE 1 TABLET 4 TIMES DAILY   trospium (SANCTURA) 20 mg, Oral, 2 times daily   Vitamin D3 5,000 Units  2 times daily     Allergies  Allergen Reactions   Ace  Inhibitors Cough    Per Dr Sherene Sires pulmonology 2013   Aspartame Diarrhea and Nausea And Vomiting   Atorvastatin Other (See Comments)    Muscle aches   Biaxin [Clarithromycin] Other (See Comments)    Unknown reaction : PATIENT CAN TOLERATE Z-PAK.  Is written on patient's paper chart.   Daliresp [Roflumilast] Nausea And Vomiting   Erythromycin Other (See Comments)    Unknown reaction: PATIENT CAN TOLERATE Z-PAK.  It is written on patient's paper chart.   Levofloxacin Nausea And Vomiting     PMHx:   Past Medical History:  Diagnosis Date   Anxiety    takes Xanax daily as needed   Asthma    Albuterol daily as needed   Blood transfusion without reported diagnosis    CAD (coronary artery disease)    LHC 2003 with 40% pLAD, 30% mLAD, 100% D1 (moderate vessel), 100%  D2 (small vessel), 95% mid small OM2.  Pt had PTCA to D1;  D2 and OM2 small vessels and tx medically;  Last Myoview (2012): anterior infarct seen with scar, no ischemia. EF normal. Med Rx // Myoview (12/14):  Low risk; ant scar w/ peri-infarct ischemia; EF 55% with ant AK // Myoview 5/16: EF 55-65, ant, apical scar, no ischemia    Cataract    Chronic back pain    HNP, DDD, spinal stenosis, vertebral compression fractures.    Chronic nausea     takes Zofran daily as needed.  normal gastric emptying study 03/2013   CKD (chronic kidney disease), stage III (HCC) 09/27/2017   Constipation    felt to be functional. takes Senokot and Miralax daily.  treated with Linzess in past.    DIVERTICULOSIS, COLON 08/28/2008   Qualifier: Diagnosis of  By: Bascom Levels, [name], [name]     Elevated LFTs 2015   probably med induced DILI, from Augmentin vs Pravastatin   GERD (gastroesophageal reflux disease)    hx of esophageal stricture    Hiatal hernia    Paraesophageal hernias. s/p fundoplications in 2013 and 04/2013   History of bronchitis several of yrs ago   History of colon polyps 03/2009, 03/2011   adenomatous colon polyps, no high grade dysplasia.    History of vertigo    no meds   Hyperlipidemia    takes Pravastatin daily   Hypertension    takes Amlodipine,Metoprolol,and Losartan daily   Multiple closed pelvic fractures without disruption of pelvic circle (HCC)    Nausea 03/2016   Osteoporosis    PONV (postoperative nausea and vomiting)    yrs ago   Pubic ramus fracture (HCC) 12/20/2019   Slow urinary stream    takes Rapaflo daily   TIA (transient ischemic attack) 1995   no residual effects noted   Vertebral compression fracture (HCC) 06/23/2012     Immunization History  Administered Date(s) Administered   DT  05/06/2014   Influenza Split 02/07/2011   Influenza Whole 02/09/2009, 01/19/2010, 02/13/2012   Influenza, High Dose  02/14/2018, 02/14/2019, 03/16/2020   PFIZER  SARS-COV-2 Vacc 05/25/2019, 06/15/2019, 03/24/2020   Pneumococcal -13 05/06/2014   Pneumococcal -23 02/09/2011   Pneumococcal -23 02/06/2010, 02/09/2012   Td 05/10/2003     Past Surgical History:  Procedure Laterality Date   APPENDECTOMY     patient unsure of date   BACK SURGERY     BLADDER REPAIR     CHOLECYSTECTOMY     Patient unsure of date.   CORONARY ANGIOPLASTY  11/16/2001   1 stent   ESOPHAGEAL MANOMETRY  N/A 03/25/2013   Procedure: ESOPHAGEAL  MANOMETRY (EM);  Surgeon: Louis Meckel, MD;  Location: [facility];  Service: Endoscopy;  Laterality: N/A;   ESOPHAGOGASTRODUODENOSCOPY N/A 03/15/2013   Procedure: ESOPHAGOGASTRODUODENOSCOPY (EGD);  Surgeon: Beverley Fiedler, MD;  Location: [facility] ENDOSCOPY;  Service: Endoscopy;  Laterality: N/A;   ESOPHAGOGASTRODUODENOSCOPY N/A 12/25/2015   Procedure: ESOPHAGOGASTRODUODENOSCOPY (EGD);  Surgeon: Sherrilyn Rist, MD;  Location: [facility] ENDOSCOPY;  Service: Endoscopy;  Laterality: N/A;   EYE SURGERY  11/2000   bilateral cataracts with lens implant   FIXATION KYPHOPLASTY LUMBAR SPINE  X 2   "L3-4"   HEMORRHOID SURGERY     HIATAL HERNIA REPAIR  06/03/2011   Procedure: LAPAROSCOPIC REPAIR OF HIATAL HERNIA;  Surgeon: Ernestene Mention, MD;  Location: [facility];  Service: General;  Laterality: N/A;   HIATAL HERNIA REPAIR N/A 04/24/2013   Procedure: LAPAROSCOPIC  REPAIR RECURRENT PARASOPHAGEAL HIATAL HERNIA WITH FUNDOPLICATION;  Surgeon: Ardeth Sportsman, MD;  Location: [facility];  Service: General;  Laterality: N/A;   INSERTION OF MESH N/A 04/24/2013   Procedure: INSERTION OF MESH;  Surgeon: Ardeth Sportsman, MD;  Location: [facility];  Service: General;  Laterality: N/A;   IR KYPHO THORACIC WITH BONE BIOPSY  09/29/2017   IR KYPHO THORACIC WITH BONE BIOPSY  11/03/2017   IR KYPHO THORACIC WITH BONE BIOPSY  02/14/2019   IR KYPHO THORACIC WITH BONE BIOPSY  04/02/2019   LAPAROSCOPIC LYSIS OF ADHESIONS N/A 04/24/2013   Procedure: LAPAROSCOPIC LYSIS OF ADHESIONS;  Surgeon: Ardeth Sportsman, MD;  Location: [facility];  Service: General;  Laterality: N/A;   LAPAROSCOPIC NISSEN FUNDOPLICATION  06/03/2011   Procedure: LAPAROSCOPIC NISSEN FUNDOPLICATION;  Surgeon: Ernestene Mention, MD;  Location: [facility];  Service: General;  Laterality: N/A;   LUMBAR FUSION  12/07/2015   Right L2-3 facetectomy, posterolateral fusion L2-3 with pedicle screws, rods, local bone graft, Vivigen cancellous chips. Fusion extended to the L1 level with pedicle screws  and rods   LUMBAR LAMINECTOMY/DECOMPRESSION MICRODISCECTOMY Right 06/26/2012   Procedure: LUMBAR LAMINECTOMY/DECOMPRESSION MICRODISCECTOMY;  Surgeon: Kerrin Champagne, MD;  Location: [facility] OR;  Service: Orthopedics;  Laterality: Right;  RIGHT L4-5 MICRODISCECTOMY   LUMBAR LAMINECTOMY/DECOMPRESSION MICRODISCECTOMY N/A 05/29/2015   Procedure: RIGHT L2-3 MICRODISCECTOMY;  Surgeon: Kerrin Champagne, MD;  Location: [facility] OR;  Service: Orthopedics;  Laterality: N/A;   TOTAL ABDOMINAL HYSTERECTOMY  1975   partial   UPPER GASTROINTESTINAL ENDOSCOPY      FHx:    Reviewed / unchanged  SHx:    Reviewed / unchanged   Systems Review:  Constitutional: Denies fever, chills, wt changes, headaches, insomnia, fatigue, night sweats, change in appetite. Eyes: Denies redness, blurred vision, diplopia, discharge, itchy, watery eyes.  ENT: Denies discharge, congestion, post nasal drip, epistaxis, sore throat, earache, hearing loss, dental pain, tinnitus, vertigo, sinus pain, snoring.  CV: Denies chest pain, palpitations, irregular heartbeat, syncope, dyspnea, diaphoresis, orthopnea, PND, claudication or edema. Respiratory: denies cough, dyspnea, DOE, pleurisy, hoarseness, laryngitis, wheezing.  Gastrointestinal: Denies dysphagia, odynophagia, heartburn, reflux, water brash, abdominal pain or cramps, nausea, vomiting, bloating, diarrhea, constipation, hematemesis, melena, hematochezia  or hemorrhoids. Genitourinary: Denies dysuria, frequency, urgency, nocturia, hesitancy, discharge, hematuria or flank pain. Musculoskeletal: Denies arthralgias, myalgias, stiffness, jt. swelling, pain, limping or strain/sprain.  Skin: Denies pruritus, rash, hives, warts, acne, eczema or change in skin lesion(s). Neuro: No weakness, tremor, incoordination, spasms, paresthesia or pain. Psychiatric: Denies confusion, memory loss or sensory loss. Endo: Denies change in weight, skin or hair change.  Heme/Lymph: No excessive bleeding, bruising or  enlarged lymph nodes.  Physical Exam  BP 130/68   Pulse 60   Temp 97.9 F (36.6 C)   Ht 5' 1.5" (1.562 m)   Wt 149 lb 9.6 oz (67.9 kg)   SpO2 98%   BMI 27.81 kg/m   Appears  well nourished, well groomed  and in no distress.  Eyes: PERRLA, EOMs, conjunctiva no swelling or erythema. Sinuses: No frontal/maxillary tenderness ENT/Mouth: EAC's clear, TM's nl w/o erythema, bulging. Nares clear w/o erythema, swelling, exudates. Oropharynx clear without erythema or exudates. Oral hygiene is good. Tongue normal, non obstructing. Hearing intact.  Neck: Supple. Thyroid not palpable. Car 2+/2+ without bruits, nodes or JVD. Chest: Kyphotic. Respirations nl with BS clear & equal w/o rales, rhonchi, wheezing or stridor.  Cor: Heart sounds normal w/ regular rate and rhythm without sig. murmurs, gallops, clicks or rubs. Peripheral pulses normal and equal  without edema.  Abdomen: Soft & bowel sounds normal. Non-tender w/o guarding, rebound, hernias, masses or organomegaly.  Lymphatics: Unremarkable.  Musculoskeletal: Full ROM all peripheral extremities, joint stability, 5/5 strength and normal gait.  Skin: Warm, dry without exposed rashes, lesions or ecchymosis apparent.  Neuro: Cranial nerves intact, reflexes equal bilaterally. Sensory-motor testing grossly intact. Tendon reflexes grossly intact.  Pysch: Alert & oriented x 3.  Insight and judgement nl & appropriate. No ideations.  Assessment and Plan:  1. Essential hypertension  - CBC with Differential/Platelet - COMPLETE METABOLIC PANEL WITH GFR - Magnesium - TSH  2. Hyperlipidemia, mixed  - Lipid panel - TSH  3. Abnormal glucose  - Hemoglobin A1c - Insulin, random  4. Vitamin D deficiency  - VITAMIN D 25 Hydroxy   5. Atherosclerosis of aorta (HCC) by Lumbar CYT scan 01/29/2021  - Lipid panel  6. Gastroesophageal reflux disease  - CBC with Differential/Platelet  7. Medication management  - CBC with  Differential/Platelet - COMPLETE METABOLIC PANEL WITH GFR - Magnesium - Lipid panel - TSH - Hemoglobin A1c - Insulin, random - VITAMIN D 25 Hydroxy          Discussed  regular exercise, BP monitoring, weight control to achieve/maintain BMI less than 25 and discussed med and SE's. Recommended labs to assess and monitor clinical status with further disposition pending results of labs.  I discussed the assessment and treatment plan with the patient. The patient was provided an opportunity to ask questions and all were answered. The patient agreed with the plan and demonstrated an understanding of the instructions.  I provided over 30 minutes of exam, counseling, chart review and  complex critical decision making.        The patient was advised to call back or seek an in-person evaluation if the symptoms worsen or if the condition fails to improve as anticipated.   Marinus Maw, MD

## 2023-02-12 NOTE — Patient Instructions (Signed)

## 2023-02-13 ENCOUNTER — Encounter: Payer: Self-pay | Admitting: Internal Medicine

## 2023-02-13 ENCOUNTER — Ambulatory Visit (INDEPENDENT_AMBULATORY_CARE_PROVIDER_SITE_OTHER): Payer: PPO | Admitting: Internal Medicine

## 2023-02-13 VITALS — BP 130/68 | HR 60 | Temp 97.9°F | Ht 61.5 in | Wt 149.6 lb

## 2023-02-13 DIAGNOSIS — I10 Essential (primary) hypertension: Secondary | ICD-10-CM

## 2023-02-13 DIAGNOSIS — J42 Unspecified chronic bronchitis: Secondary | ICD-10-CM | POA: Diagnosis not present

## 2023-02-13 DIAGNOSIS — K219 Gastro-esophageal reflux disease without esophagitis: Secondary | ICD-10-CM | POA: Diagnosis not present

## 2023-02-13 DIAGNOSIS — Z23 Encounter for immunization: Secondary | ICD-10-CM | POA: Diagnosis not present

## 2023-02-13 MED ORDER — PANTOPRAZOLE SODIUM 40 MG PO TBEC
DELAYED_RELEASE_TABLET | ORAL | 3 refills | Status: AC
Start: 2023-02-13 — End: ?

## 2023-02-13 MED ORDER — PROMETHAZINE HCL 25 MG PO TABS
ORAL_TABLET | ORAL | 1 refills | Status: DC
Start: 2023-02-13 — End: 2024-01-15

## 2023-02-21 NOTE — Discharge Instructions (Signed)
Post Procedure Spinal Discharge Instruction Sheet  You may resume a regular diet and any medications that you routinely take (including pain medications) unless otherwise noted by MD.  No driving day of procedure.  Light activity throughout the rest of the day.  Do not do any strenuous work, exercise, bending or lifting.  The day following the procedure, you can resume normal physical activity but you should refrain from exercising or physical therapy for at least three days thereafter.  You may apply ice to the injection site, 20 minutes on, 20 minutes off, as needed. Do not apply ice directly to skin.    Common Side Effects:  Headaches- take your usual medications as directed by your physician.  Increase your fluid intake.  Caffeinated beverages may be helpful.  Lie flat in bed until your headache resolves.  Restlessness or inability to sleep- you may have trouble sleeping for the next few days.  Ask your referring physician if you need any medication for sleep.  Facial flushing or redness- should subside within a few days.  Increased pain- a temporary increase in pain a day or two following your procedure is not unusual.  Take your pain medication as prescribed by your referring physician.  Leg cramps  Please contact our office at [phone] for the following symptoms: Fever greater than 100 degrees. Headaches unresolved with medication after 2-3 days. Increased swelling, pain, or redness at injection site.   Thank you for visiting [facility] today.   YOU MAY RESUME YOUR ASPIRIN TODAY.

## 2023-02-22 ENCOUNTER — Ambulatory Visit
Admission: RE | Admit: 2023-02-22 | Discharge: 2023-02-22 | Disposition: A | Discharge status: Home / Self Care | Payer: PPO | Source: Ambulatory Visit | Attending: Neurological Surgery | Admitting: Neurological Surgery

## 2023-02-22 ENCOUNTER — Other Ambulatory Visit: Payer: Self-pay | Admitting: Neurological Surgery

## 2023-02-22 DIAGNOSIS — M4727 Other spondylosis with radiculopathy, lumbosacral region: Secondary | ICD-10-CM | POA: Diagnosis not present

## 2023-02-22 DIAGNOSIS — M5416 Radiculopathy, lumbar region: Secondary | ICD-10-CM

## 2023-02-22 DIAGNOSIS — Z981 Arthrodesis status: Secondary | ICD-10-CM | POA: Diagnosis not present

## 2023-02-22 MED ORDER — IOPAMIDOL (ISOVUE-M 200) INJECTION 41%
1.0000 mL | Freq: Once | INTRAMUSCULAR | Status: AC
  Administered 2023-02-22: 1 mL via EPIDURAL

## 2023-02-22 MED ORDER — METHYLPREDNISOLONE ACETATE 40 MG/ML INJ SUSP (RADIOLOG
80.0000 mg | Freq: Once | INTRAMUSCULAR | Status: AC
  Administered 2023-02-22: 80 mg via EPIDURAL

## 2023-03-09 ENCOUNTER — Encounter: Payer: Self-pay | Admitting: Nurse Practitioner

## 2023-03-09 ENCOUNTER — Ambulatory Visit (INDEPENDENT_AMBULATORY_CARE_PROVIDER_SITE_OTHER): Payer: PPO | Admitting: Nurse Practitioner

## 2023-03-09 ENCOUNTER — Ambulatory Visit: Payer: PPO | Admitting: Internal Medicine

## 2023-03-09 VITALS — BP 134/68 | HR 58 | Temp 97.5°F | Ht 61.5 in | Wt 144.0 lb

## 2023-03-09 DIAGNOSIS — R5383 Other fatigue: Secondary | ICD-10-CM | POA: Diagnosis not present

## 2023-03-09 DIAGNOSIS — D508 Other iron deficiency anemias: Secondary | ICD-10-CM

## 2023-03-09 DIAGNOSIS — I10 Essential (primary) hypertension: Secondary | ICD-10-CM

## 2023-03-09 DIAGNOSIS — Z79899 Other long term (current) drug therapy: Secondary | ICD-10-CM

## 2023-03-09 DIAGNOSIS — G2581 Restless legs syndrome: Secondary | ICD-10-CM

## 2023-03-09 MED ORDER — ROPINIROLE HCL 2 MG PO TABS: 1.0000 mg | ORAL_TABLET | Freq: Every day | ORAL | 0 refills | Status: AC

## 2023-03-09 NOTE — Patient Instructions (Signed)
Ropinirole Tablets What is this medication? ROPINIROLE (roe PIN i role) treats the symptoms of Parkinson disease. It works by acting like dopamine, a substance in your body that helps manage movements and coordination. This reduces the symptoms of Parkinson, such as body stiffness and tremors. It may also be used to treat restless legs syndrome (RLS). This medicine may be used for other purposes; ask your health care provider or pharmacist if you have questions. COMMON BRAND NAME(S): Requip What should I tell my care team before I take this medication? They need to know if you have any of these conditions: Heart disease High blood pressure Kidney disease Liver disease Low blood pressure Narcolepsy Sleep apnea Tobacco use An unusual or allergic reaction to ropinirole, other medications, foods, dyes, or preservatives Pregnant or trying to get pregnant Breast-feeding How should I use this medication? Take this medication by mouth with water. Take it as directed on the prescription label. You can take it with or without food. If it upsets your stomach, take it with food. If it upsets your stomach, take it with food. Keep taking this medication unless your care team tells you to stop. Stopping it too quickly can cause serious side effects. It can also make your condition worse. Talk to your care team about the use of this medication in children. Special care may be needed. Overdosage: If you think you have taken too much of this medicine contact a poison control center or emergency room at once. NOTE: This medicine is only for you. Do not share this medicine with others. What if I miss a dose? If you miss a dose, take it as soon as you can. If it is almost time for your next dose, take only that dose. Do not take double or extra doses. What may interact with this medication? Alcohol Antihistamines for allergy, cough and cold Certain medications for depression, anxiety, or mental health  conditions Certain medications for seizures, such as phenobarbital, primidone Certain medications for sleep Ciprofloxacin Estrogen or progestin hormones Fluvoxamine General anesthetics, such as halothane, isoflurane, methoxyflurane, propofol Medications for blood pressure Medications that relax muscles for surgery Metoclopramide Opioid medications for pain Rifampin Tobacco This list may not describe all possible interactions. Give your health care provider a list of all the medicines, herbs, non-prescription drugs, or dietary supplements you use. Also tell them if you smoke, drink alcohol, or use illegal drugs. Some items may interact with your medicine. What should I watch for while using this medication? Visit your care team for regular checks on your progress. Tell your care team if your symptoms do not start to get better or if they get worse. Do not suddenly stop taking this medication. You may develop a severe reaction. Your care team will tell you how much medication to take. If your care team wants you to stop the medication, the dose may be slowly lowered over time to avoid any side effects. This medication may affect your coordination, reaction time, or judgement. Do not drive or operate machinery until you know how this medication affects you. Sit up or stand slowly to reduce the risk of dizzy or fainting spells. Drinking alcohol with this medication can increase the risk of these side effects. When taking this medication, you may fall asleep without notice. You may be doing activities, such as driving a car, talking, or eating. You may not feel drowsy before it happens. Contact your care team right away if this happens to you. There have been reports  of increased sexual urges or other strong urges, such as gambling while taking this medication. If you experience any of these while taking this medication, you should report this to your care team as soon as possible. Your mouth may get  dry. Chewing sugarless gum or sucking hard candy and drinking plenty of water may help. Contact your care team if the problem does not go away or is severe. What side effects may I notice from receiving this medication? Side effects that you should report to your care team as soon as possible: Allergic reactions--skin rash, itching, hives, swelling of the face, lips, tongue, or throat Falling asleep during daily activities Low blood pressure--dizziness, feeling faint or lightheaded, blurry vision Mood and behavior changes--anxiety, nervousness, irritability and restlessness, confusion, hallucinations, feeling distrust or suspicion of others New or worsening uncontrolled and repetitive movements of the face, mouth, or upper body Slow heartbeat--dizziness, feeling faint or lightheaded, confusion, trouble breathing, unusual weakness or fatigue Urges to engage in impulsive behaviors such as gambling, binge eating, sexual activity, or shopping in ways that are unusual for you Side effects that usually do not require medical attention (report to your care team if they continue or are bothersome): Dizziness Drowsiness Nausea Swelling of the ankles, hands, or feet Unusual weakness or fatigue Upset stomach Vomiting This list may not describe all possible side effects. Call your doctor for medical advice about side effects. You may report side effects to FDA at 1-800-FDA-1088. Where should I keep my medication? Keep out of the reach of children and pets. Store at room temperature between 20 and 25 degrees C (68 and 77 degrees F). Protect from light and moisture. Keep the container tightly closed. Get rid of any unused medication after the expiration date. To get rid of medications that are no longer needed or have expired: Take the medication to a take-back program. Check with your pharmacy or law enforcement to find a location. If you cannot return the medication, check the label or package insert to  see if the medication should be thrown out in the garbage or flushed down the toilet. If you are not sure, ask your care team. If it is safe to put it in the trash, empty the medication out of the container. Mix the medication with cat litter, dirt, coffee grounds, or other unwanted substance. Seal the mixture in a bag or container. Put it in the trash. NOTE: This sheet is a summary. It may not cover all possible information. If you have questions about this medicine, talk to your doctor, pharmacist, or health care provider.  2024 Elsevier/Gold Standard (2021-08-04 00:00:00)

## 2023-03-09 NOTE — Progress Notes (Signed)
Assessment and Plan:  [name] [name] was seen today for nausea.  Diagnoses and all orders for this visit:  Essential hypertension - continue medications- amlodipine 10 mg every day and propranolol 20 mg BID  - continue DASH diet, exercise and monitor at home. Call if greater than 130/80.   -     CBC with Differential/Platelet -     COMPLETE METABOLIC PANEL WITH GFR  Other iron deficiency anemia -     CBC with Differential/Platelet -     Iron, TIBC and Ferritin Panel  Fatigue, unspecified type Continue multivitamin Practice good sleep hygiene -     TSH  Medication management Medications reviewed and Questions answered  -     CBC with Differential/Platelet -     COMPLETE METABOLIC PANEL WITH GFR -     Iron, TIBC and Ferritin Panel -     TSH  Restless leg Has tried Magnesium with no relief and is getting very little sleep Try ropinirole and practice good sleep hygiene -     rOPINIRole (REQUIP) 2 MG tablet; Take 0.5 tablets (1 mg total) by mouth at bedtime.       Further disposition pending results of labs. Discussed med's effects and SE's.   Over 30 minutes of exam, counseling, chart review, and critical decision making was performed.   Future Appointments  Date Time Provider Department Center  06/06/2023 11:00 AM Raynelle Dick, NP [facility] None  11/06/2023 10:00 AM Lucky Cowboy, MD [facility] None  02/13/2024 11:00 AM Lucky Cowboy, MD [facility] None    ------------------------------------------------------------------------------------------------------------------   HPI BP 134/68   Pulse (!) 58   Temp (!) 97.5 F (36.4 C)   Ht 5' 1.5" (1.562 m)   Wt 144 lb (65.3 kg)   SpO2 97%   BMI 26.77 kg/m  87 y.o.female presents for fatigue, no appetite, and nausea.  Denies body aches, vomiting, cough, congestion, sore throat and headaches. Started a week ago with dizziness but that has resolved. She does use Phenergan for nausea. She has not been able to sleep  well at night for about a week- getting 4 hours of sleep a night.   BP well controlled with amlodipine 10 mg every day and propranolol 20 mg BID  BP Readings from Last 3 Encounters:  03/09/23 134/68  02/22/23 (!) 155/79  02/13/23 130/68  Denies headaches, chest pain, shortness of breath and dizziness   BMI is Body mass index is 26.77 kg/m., she has not been working on diet and exercise. Wt Readings from Last 3 Encounters:  03/09/23 144 lb (65.3 kg)  02/13/23 149 lb 9.6 oz (67.9 kg)  01/23/23 148 lb (67.1 kg)   Lab Results  Component Value Date   TSH 0.85 10/31/2022    Lab Results  Component Value Date   IRON 106 01/04/2021   TIBC 369.6 01/04/2021   FERRITIN 38.4 01/04/2021    Past Medical History:  Diagnosis Date   Anxiety    takes Xanax daily as needed   Asthma    Albuterol daily as needed   Blood transfusion without reported diagnosis    CAD (coronary artery disease)    LHC 2003 with 40% pLAD, 30% mLAD, 100% D1 (moderate vessel), 100%  D2 (small vessel), 95% mid small OM2.  Pt had PTCA to D1;  D2 and OM2 small vessels and tx medically;  Last Myoview (2012): anterior infarct seen with scar, no ischemia. EF normal. Med Rx // Myoview (12/14):  Low risk; ant scar w/ peri-infarct  ischemia; EF 55% with ant AK // Myoview 5/16: EF 55-65, ant, apical scar, no ischemia    Cataract    Chronic back pain    HNP, DDD, spinal stenosis, vertebral compression fractures.    Chronic nausea    takes Zofran daily as needed.  normal gastric emptying study 03/2013   CKD (chronic kidney disease), stage III (HCC) 09/27/2017   Constipation    felt to be functional. takes Senokot and Miralax daily.  treated with Linzess in past.    DIVERTICULOSIS, COLON 08/28/2008   Qualifier: Diagnosis of  By: Bascom Levels, [name], [name]     Elevated LFTs 2015   probably med induced DILI, from Augmentin vs Pravastatin   GERD (gastroesophageal reflux disease)    hx of esophageal stricture    Hiatal hernia     Paraesophageal hernias. s/p fundoplications in 2013 and 04/2013   History of bronchitis several of yrs ago   History of colon polyps 03/2009, 03/2011   adenomatous colon polyps, no high grade dysplasia.    History of vertigo    no meds   Hyperlipidemia    takes Pravastatin daily   Hypertension    takes Amlodipine,Metoprolol,and Losartan daily   Multiple closed pelvic fractures without disruption of pelvic circle (HCC)    Nausea 03/2016   Osteoporosis    PONV (postoperative nausea and vomiting)    yrs ago   Pubic ramus fracture (HCC) 12/20/2019   Slow urinary stream    takes Rapaflo daily   TIA (transient ischemic attack) 1995   no residual effects noted   Vertebral compression fracture (HCC) 06/23/2012     Allergies  Allergen Reactions   Ace Inhibitors Cough    Per Dr Sherene Sires pulmonology 2013   Aspartame Diarrhea and Nausea And Vomiting   Atorvastatin Other (See Comments)    Muscle aches   Biaxin [Clarithromycin] Other (See Comments)    Unknown reaction : PATIENT CAN TOLERATE Z-PAK.  Is written on patient's paper chart.   Daliresp [Roflumilast] Nausea And Vomiting   Erythromycin Other (See Comments)    Unknown reaction: PATIENT CAN TOLERATE Z-PAK.  It is written on patient's paper chart.   Levofloxacin Nausea And Vomiting    Current Outpatient Medications on File Prior to Visit  Medication Sig   albuterol (PROVENTIL) (2.5 MG/3ML) 0.083% nebulizer solution Take 3 mLs (2.5 mg total) by nebulization every 4 (four) hours as needed for wheezing or shortness of breath.   albuterol (VENTOLIN HFA) 108 (90 Base) MCG/ACT inhaler INHALE 2 PUFFS BY MOUTH EVERY 4 HOURS AS NEEDED FOR WHEEZING FOR SHORTNESS OF BREATH   amLODipine (NORVASC) 10 MG tablet Take  1 tablet Daikly for BP & Heart                                                                        /                                                                   TAKE  BY                                                  MOUTH   aspirin EC 81 MG tablet Take 81 mg by mouth at bedtime. Every other night   budesonide (PULMICORT) 0.25 MG/2ML nebulizer solution Take 2 mLs (0.25 mg total) by nebulization 4 (four) times daily.   busPIRone (BUSPAR) 10 MG tablet Take 1/2 to 1 tablet 3 x /day if Needed for Anxiety or Panic Attack  (( Replaces Xanax ))   Cholecalciferol (VITAMIN D3) 5000 units CAPS Take 5,000 Units by mouth 2 (two) times daily.    gabapentin (NEURONTIN) 600 MG tablet Take  1/2 to 1 tablet  3 to 4 x /Daily  as needed for Pain   ketoconazole (NIZORAL) 2 % cream APPLY TO RINGWORM  RASH TWICE A DAY   Magnesium 400 MG TABS Take 400 mg by mouth daily at 2 PM.   melatonin 5 MG TABS Take 5 mg by mouth at bedtime as needed.   mometasone-formoterol (DULERA) 200-5 MCG/ACT AERO Take 2 puffs first thing in am and then another 2 puffs about 12 hours later.   nitroGLYCERIN (NITROSTAT) 0.4 MG SL tablet Place 1 tablet (0.4 mg total) under the tongue every 5 (five) minutes as needed for chest pain.   pantoprazole (PROTONIX) 40 MG tablet Take  1 tablet  Daily  to Prevent Heartburn &  Indigestion   promethazine (PHENERGAN) 25 MG tablet Take  1 tablet   4 x / day  before Meals & Bedtime  as needed for Nausea   propranolol (INDERAL) 20 MG tablet Take 1 tablet (20 mg total) by mouth 2 (two) times daily.   senna-docusate (SENOKOT-S) 8.6-50 MG tablet Take 2 tablets by mouth 2 (two) times daily.   solifenacin (VESICARE) 5 MG tablet Take 5 mg by mouth daily.   sucralfate (CARAFATE) 1 g tablet Take 1 g by mouth 4 (four) times daily -  with meals and at bedtime.   vitamin B-12 (CYANOCOBALAMIN) 1000 MCG tablet Take 5,000 mcg by mouth daily.   No current facility-administered medications on file prior to visit.    Review of Systems  Constitutional:  Positive for malaise/fatigue. Negative for chills, fever and weight loss.  HENT:  Negative for congestion and hearing loss.   Eyes:  Negative for blurred vision  and double vision.  Respiratory:  Negative for cough and shortness of breath.   Cardiovascular:  Negative for chest pain, palpitations, orthopnea and leg swelling.  Gastrointestinal:  Negative for abdominal pain, constipation, diarrhea, heartburn, nausea and vomiting.  Musculoskeletal:  Positive for myalgias (of legs). Negative for falls and joint pain.  Skin:  Negative for rash.  Neurological:  Negative for dizziness, tingling, tremors, loss of consciousness and headaches.  Psychiatric/Behavioral:  Negative for depression, memory loss and suicidal ideas. The patient has insomnia.      Physical Exam:  BP 134/68   Pulse (!) 58   Temp (!) 97.5 F (36.4 C)   Ht 5' 1.5" (1.562 m)   Wt 144 lb (65.3 kg)   SpO2 97%   BMI 26.77 kg/m   General Appearance: Frail female who appears very fatigued Eyes: PERRLA, EOMs, conjunctiva no swelling or erythema Sinuses: No Frontal/maxillary tenderness ENT/Mouth: Ext aud canals clear, TMs without erythema, bulging. No erythema, swelling, or exudate  on post pharynx.   Hearing normal.  Neck: Supple, thyroid normal.  Respiratory: Respiratory effort normal, BS equal bilaterally without rales, rhonchi, wheezing or stridor.  Cardio: RRR with no MRGs. Brisk peripheral pulses without edema.  Abdomen: Soft, + BS.  Non tender, no guarding, rebound, hernias, masses. Lymphatics: Non tender without lymphadenopathy.  Musculoskeletal: Full ROM, 5/5 strength, antalgic gait.  Skin: Warm, dry without rashes, lesions, ecchymosis.  Neuro: Cranial nerves intact. Normal muscle tone, no cerebellar symptoms. Sensation intact.  Psych: Awake and oriented X 3, normal affect, Insight and Judgment appropriate.     Raynelle Dick, NP 2:55 PM [facility]

## 2023-03-10 LAB — CBC WITH DIFFERENTIAL/PLATELET
Absolute Lymphocytes: 2111 {cells}/uL (ref 850–3900)
Absolute Monocytes: 476 {cells}/uL (ref 200–950)
Basophils Absolute: 62 {cells}/uL (ref 0–200)
Basophils Relative: 0.9 %
Eosinophils Absolute: 317 {cells}/uL (ref 15–500)
Eosinophils Relative: 4.6 %
HCT: 45.8 % — ABNORMAL HIGH (ref 35.0–45.0)
Hemoglobin: 15.4 g/dL (ref 11.7–15.5)
MCH: 31.7 pg (ref 27.0–33.0)
MCHC: 33.6 g/dL (ref 32.0–36.0)
MCV: 94.2 fL (ref 80.0–100.0)
MPV: 10.7 fL (ref 7.5–12.5)
Monocytes Relative: 6.9 %
Neutro Abs: 3933 {cells}/uL (ref 1500–7800)
Neutrophils Relative %: 57 %
Platelets: 345 10*3/uL (ref 140–400)
RBC: 4.86 10*6/uL (ref 3.80–5.10)
RDW: 12.2 % (ref 11.0–15.0)
Total Lymphocyte: 30.6 %
WBC: 6.9 10*3/uL (ref 3.8–10.8)

## 2023-03-10 LAB — COMPLETE METABOLIC PANEL WITH GFR
AG Ratio: 1.5 (calc) (ref 1.0–2.5)
ALT: 13 U/L (ref 6–29)
AST: 14 U/L (ref 10–35)
Albumin: 4.4 g/dL (ref 3.6–5.1)
Alkaline phosphatase (APISO): 73 U/L (ref 37–153)
BUN: 16 mg/dL (ref 7–25)
CO2: 24 mmol/L (ref 20–32)
Calcium: 9.7 mg/dL (ref 8.6–10.4)
Chloride: 103 mmol/L (ref 98–110)
Creat: 0.89 mg/dL (ref 0.60–0.95)
Globulin: 3 g/dL (ref 1.9–3.7)
Glucose, Bld: 103 mg/dL — ABNORMAL HIGH (ref 65–99)
Potassium: 4.4 mmol/L (ref 3.5–5.3)
Sodium: 137 mmol/L (ref 135–146)
Total Bilirubin: 1.1 mg/dL (ref 0.2–1.2)
Total Protein: 7.4 g/dL (ref 6.1–8.1)
eGFR: 63 mL/min/{1.73_m2} (ref >=60)

## 2023-03-10 LAB — IRON,TIBC AND FERRITIN PANEL
%SAT: 38 % (ref 16–45)
Ferritin: 55 ng/mL (ref 16–288)
Iron: 141 ug/dL (ref 45–160)
TIBC: 372 ug/dL (ref 250–450)

## 2023-03-10 LAB — TSH: TSH: 1.13 m[IU]/L (ref 0.40–4.50)

## 2023-03-13 DIAGNOSIS — N311 Reflex neuropathic bladder, not elsewhere classified: Secondary | ICD-10-CM | POA: Diagnosis not present

## 2023-03-13 DIAGNOSIS — N3946 Mixed incontinence: Secondary | ICD-10-CM | POA: Diagnosis not present

## 2023-03-22 ENCOUNTER — Encounter: Payer: Self-pay | Admitting: Internal Medicine

## 2023-05-12 ENCOUNTER — Encounter: Payer: Self-pay | Admitting: Nurse Practitioner

## 2023-05-12 ENCOUNTER — Ambulatory Visit (INDEPENDENT_AMBULATORY_CARE_PROVIDER_SITE_OTHER): Payer: PPO | Admitting: Nurse Practitioner

## 2023-05-12 VITALS — BP 128/62 | HR 61 | Temp 97.7°F | Ht 61.5 in

## 2023-05-12 DIAGNOSIS — U071 COVID-19: Secondary | ICD-10-CM | POA: Diagnosis not present

## 2023-05-12 DIAGNOSIS — Z1152 Encounter for screening for COVID-19: Secondary | ICD-10-CM | POA: Diagnosis not present

## 2023-05-12 LAB — POC COVID19 BINAXNOW: SARS Coronavirus 2 Ag: POSITIVE — AB

## 2023-05-12 MED ORDER — BENZONATATE 100 MG PO CAPS: 100.0000 mg | ORAL_CAPSULE | Freq: Four times a day (QID) | ORAL | 1 refills | Status: AC | PRN

## 2023-05-12 MED ORDER — AZITHROMYCIN 250 MG PO TABS: ORAL_TABLET | ORAL | 1 refills | Status: DC

## 2023-05-12 MED ORDER — DEXAMETHASONE 4 MG PO TABS: ORAL_TABLET | ORAL | 0 refills | Status: DC

## 2023-05-12 NOTE — Patient Instructions (Addendum)
 Covid 19 Immue support reviewed Vit c , Vit D, Zinc Mucinex  to thin mucus Take tylenol  PRN temp 101+ Push hydration Regular ambulation or calf exercises exercises for clot prevention and 81 mg ASA unless contraindicated Sx supportive therapy suggested Follow up via mychart or telephone if needed Advised patient obtain O2 monitor; present to ED if persistently <90% or with severe dyspnea, CP, fever uncontrolled by tylenol , confusion, sudden decline       Should remain in isolation 5 days from testing positive and then wear a mask when around other people for the following 5 days   COVID-19 -     dexamethasone  (DECADRON ) 4 MG tablet; Take 3 tabs for 3 days, 2 tabs for 3 days 1 tab for 5 days. Take with food. -     promethazine -dextromethorphan  (PROMETHAZINE -DM) 6.25-15 MG/5ML syrup; Take 5 mLs by mouth 4 (four) times daily as needed for cough. - Zithromax  as directed

## 2023-05-12 NOTE — Progress Notes (Signed)
 Assessment and Plan:  [name] [name] was seen today for acute visit.  Diagnoses and all orders for this visit:  Encounter for screening for COVID-19 -     POC COVID-19  COVID-19 Immue support reviewed Vit c , Vit D, Zinc Mucinex  to thin mucus Take tylenol  PRN temp 101+ Push hydration Regular ambulation or calf exercises exercises for clot prevention and 81 mg ASA unless contraindicated Sx supportive therapy suggested Follow up via mychart or telephone if needed Advised patient obtain O2 monitor; present to ED if persistently <90% or with severe dyspnea, CP, fever uncontrolled by tylenol , confusion, sudden decline       Should remain in isolation 5 days from testing positive and then wear a mask when around other people for the following 5 days -     dexamethasone  (DECADRON ) 4 MG tablet; Take 3 tabs for 3 days, 2 tabs for 3 days 1 tab for 5 days. Take with food. -     azithromycin  (ZITHROMAX ) 250 MG tablet; Take 2 tablets (500 mg) on  Day 1,  followed by 1 tablet (250 mg) once daily on Days 2 through 5. -     benzonatate  (TESSALON  PERLES) 100 MG capsule; Take 1 capsule (100 mg total) by mouth every 6 (six) hours as needed for cough.       Further disposition pending results of labs. Discussed med's effects and SE's.   Over 30 minutes of exam, counseling, chart review, and critical decision making was performed.   Future Appointments  Date Time Provider Department Center  06/06/2023 11:00 AM Jude Lonell BRAVO, NP [facility] None  11/06/2023 10:00 AM Tonita Fallow, MD [facility] None  02/13/2024 11:00 AM Tonita Fallow, MD [facility] None    ------------------------------------------------------------------------------------------------------------------   HPI Pulse 61   Temp 97.7 F (36.5 C)   Ht 5' 1.5 (1.562 m)   SpO2 95%   BMI 26.77 kg/m   87 y.o.female presents for husband tested positive for covid today in the office. She is having body aches, nausea, fatigue.  Denies  cough, sinus pressure, vomiting and diarrhea.  BP well controlled with Amlodipine  10 mg every day , propranolol  20 mg BID  BP Readings from Last 3 Encounters:  03/09/23 134/68  02/22/23 (!) 155/79  02/13/23 130/68  Denies headaches, chest pain, shortness of breath and dizziness   BMI is Body mass index is 26.77 kg/m., she has not been working on diet and exercise. Wt Readings from Last 3 Encounters:  03/09/23 144 lb (65.3 kg)  02/13/23 149 lb 9.6 oz (67.9 kg)  01/23/23 148 lb (67.1 kg)     Past Medical History:  Diagnosis Date   Anxiety    takes Xanax  daily as needed   Asthma    Albuterol  daily as needed   Blood transfusion without reported diagnosis    CAD (coronary artery disease)    LHC 2003 with 40% pLAD, 30% mLAD, 100% D1 (moderate vessel), 100%  D2 (small vessel), 95% mid small OM2.  Pt had PTCA to D1;  D2 and OM2 small vessels and tx medically;  Last Myoview  (2012): anterior infarct seen with scar, no ischemia. EF normal. Med Rx // Myoview  (12/14):  Low risk; ant scar w/ peri-infarct ischemia; EF 55% with ant AK // Myoview  5/16: EF 55-65, ant, apical scar, no ischemia    Cataract    Chronic back pain    HNP, DDD, spinal stenosis, vertebral compression fractures.    Chronic nausea    takes Zofran  daily as needed.  normal gastric emptying study 03/2013   CKD (chronic kidney disease), stage III (HCC) 09/27/2017   Constipation    felt to be functional. takes Senokot and Miralax  daily.  treated with Linzess  in past.    DIVERTICULOSIS, COLON 08/28/2008   Qualifier: Diagnosis of  By: Vivian, [name], [name]     Elevated LFTs 2015   probably med induced DILI, from Augmentin  vs Pravastatin    GERD (gastroesophageal reflux disease)    hx of esophageal stricture    Hiatal hernia    Paraesophageal hernias. s/p fundoplications in 2013 and 04/2013   History of bronchitis several of yrs ago   History of colon polyps 03/2009, 03/2011   adenomatous colon polyps, no high grade dysplasia.     History of vertigo    no meds   Hyperlipidemia    takes Pravastatin  daily   Hypertension    takes Amlodipine ,Metoprolol ,and Losartan  daily   Multiple closed pelvic fractures without disruption of pelvic circle (HCC)    Nausea 03/2016   Osteoporosis    PONV (postoperative nausea and vomiting)    yrs ago   Pubic ramus fracture (HCC) 12/20/2019   Slow urinary stream    takes Rapaflo daily   TIA (transient ischemic attack) 1995   no residual effects noted   Vertebral compression fracture (HCC) 06/23/2012     Allergies  Allergen Reactions   Ace Inhibitors Cough    Per Dr Darlean pulmonology 2013   Aspartame Diarrhea and Nausea And Vomiting   Atorvastatin  Other (See Comments)    Muscle aches   Biaxin [Clarithromycin] Other (See Comments)    Unknown reaction : PATIENT CAN TOLERATE Z-PAK.  Is written on patient's paper chart.   Daliresp [Roflumilast] Nausea And Vomiting   Erythromycin  Other (See Comments)    Unknown reaction: PATIENT CAN TOLERATE Z-PAK.  It is written on patient's paper chart.   Levofloxacin  Nausea And Vomiting    Current Outpatient Medications on File Prior to Visit  Medication Sig   albuterol  (PROVENTIL ) (2.5 MG/3ML) 0.083% nebulizer solution Take 3 mLs (2.5 mg total) by nebulization every 4 (four) hours as needed for wheezing or shortness of breath.   albuterol  (VENTOLIN  HFA) 108 (90 Base) MCG/ACT inhaler INHALE 2 PUFFS BY MOUTH EVERY 4 HOURS AS NEEDED FOR WHEEZING FOR SHORTNESS OF BREATH   amLODipine  (NORVASC ) 10 MG tablet Take  1 tablet Daikly for BP & Heart                                                                        /                                                                   TAKE                                         BY  MOUTH   aspirin  EC 81 MG tablet Take 81 mg by mouth at bedtime. Every other night   budesonide  (PULMICORT ) 0.25 MG/2ML nebulizer solution Take 2 mLs (0.25 mg total) by  nebulization 4 (four) times daily.   busPIRone  (BUSPAR ) 10 MG tablet Take 1/2 to 1 tablet 3 x /day if Needed for Anxiety or Panic Attack  (( Replaces Xanax  ))   Cholecalciferol  (VITAMIN D3) 5000 units CAPS Take 5,000 Units by mouth 2 (two) times daily.    gabapentin  (NEURONTIN ) 600 MG tablet Take  1/2 to 1 tablet  3 to 4 x /Daily  as needed for Pain   ketoconazole  (NIZORAL ) 2 % cream APPLY TO RINGWORM  RASH TWICE A DAY   Magnesium  400 MG TABS Take 400 mg by mouth daily at 2 PM.   melatonin 5 MG TABS Take 5 mg by mouth at bedtime as needed.   mometasone -formoterol  (DULERA ) 200-5 MCG/ACT AERO Take 2 puffs first thing in am and then another 2 puffs about 12 hours later.   nitroGLYCERIN  (NITROSTAT ) 0.4 MG SL tablet Place 1 tablet (0.4 mg total) under the tongue every 5 (five) minutes as needed for chest pain.   pantoprazole  (PROTONIX ) 40 MG tablet Take  1 tablet  Daily  to Prevent Heartburn &  Indigestion   promethazine  (PHENERGAN ) 25 MG tablet Take  1 tablet   4 x / day  before Meals & Bedtime  as needed for Nausea   propranolol  (INDERAL ) 20 MG tablet Take 1 tablet (20 mg total) by mouth 2 (two) times daily.   rOPINIRole  (REQUIP ) 2 MG tablet Take 0.5 tablets (1 mg total) by mouth at bedtime.   senna-docusate (SENOKOT-S) 8.6-50 MG tablet Take 2 tablets by mouth 2 (two) times daily.   solifenacin (VESICARE) 5 MG tablet Take 5 mg by mouth daily.   sucralfate  (CARAFATE ) 1 g tablet Take 1 g by mouth 4 (four) times daily -  with meals and at bedtime.   vitamin B-12 (CYANOCOBALAMIN ) 1000 MCG tablet Take 5,000 mcg by mouth daily.   No current facility-administered medications on file prior to visit.    ROS: all negative except above.   Physical Exam:  Pulse 61   Temp 97.7 F (36.5 C)   Ht 5' 1.5 (1.562 m)   SpO2 95%   BMI 26.77 kg/m   General Appearance: Well nourished, in no apparent distress. Eyes: PERRLA, EOMs, conjunctiva no swelling or erythema Sinuses: No Frontal/maxillary  tenderness ENT/Mouth: Ext aud canals clear, TMs without erythema, bulging. No erythema, swelling, or exudate on post pharynx.  Tonsils not swollen or erythematous. Hearing normal.  Neck: Supple, thyroid  normal.  Respiratory: Respiratory effort normal, BS with crackles throughout, mild expiratory wheezes lower lobes bilaterally Cardio: RRR with no MRGs. Brisk peripheral pulses without edema.  Abdomen: Soft, + BS.  Non tender, no guarding, rebound, hernias, masses. Lymphatics: Non tender without lymphadenopathy.  Musculoskeletal: Full ROM, 5/5 strength, normal gait.  Skin: Warm, dry without rashes, lesions, ecchymosis.  Neuro: Cranial nerves intact. Normal muscle tone, no cerebellar symptoms. Sensation intact.  Psych: Awake and oriented X 3, normal affect, Insight and Judgment appropriate.     [name] [name] E Shiza Thelen, NP 11:23 AM [name]

## 2023-05-13 ENCOUNTER — Other Ambulatory Visit: Payer: Self-pay | Admitting: Internal Medicine

## 2023-05-22 ENCOUNTER — Other Ambulatory Visit: Payer: Self-pay | Admitting: Nurse Practitioner

## 2023-05-22 DIAGNOSIS — U071 COVID-19: Secondary | ICD-10-CM

## 2023-05-23 ENCOUNTER — Other Ambulatory Visit: Payer: Self-pay

## 2023-05-23 ENCOUNTER — Encounter (HOSPITAL_COMMUNITY): Payer: Self-pay

## 2023-05-23 ENCOUNTER — Emergency Department (HOSPITAL_COMMUNITY)
Admission: EM | Admit: 2023-05-23 | Discharge: 2023-05-24 | Discharge status: Against Medical Advice | Payer: PPO | Attending: Emergency Medicine | Admitting: Emergency Medicine

## 2023-05-23 ENCOUNTER — Emergency Department (HOSPITAL_COMMUNITY): Payer: PPO

## 2023-05-23 DIAGNOSIS — M25551 Pain in right hip: Secondary | ICD-10-CM | POA: Insufficient documentation

## 2023-05-23 DIAGNOSIS — M79641 Pain in right hand: Secondary | ICD-10-CM | POA: Insufficient documentation

## 2023-05-23 DIAGNOSIS — W1839XA Other fall on same level, initial encounter: Secondary | ICD-10-CM | POA: Diagnosis not present

## 2023-05-23 DIAGNOSIS — Z5321 Procedure and treatment not carried out due to patient leaving prior to being seen by health care provider: Secondary | ICD-10-CM | POA: Insufficient documentation

## 2023-05-23 DIAGNOSIS — M79651 Pain in right thigh: Secondary | ICD-10-CM | POA: Diagnosis not present

## 2023-05-23 LAB — CBC
HCT: 41.4 % (ref 36.0–46.0)
Hemoglobin: 13.7 g/dL (ref 12.0–15.0)
MCH: 31.9 pg (ref 26.0–34.0)
MCHC: 33.1 g/dL (ref 30.0–36.0)
MCV: 96.3 fL (ref 80.0–100.0)
Platelets: 364 10*3/uL (ref 150–400)
RBC: 4.3 MIL/uL (ref 3.87–5.11)
RDW: 14.6 % (ref 11.5–15.5)
WBC: 20.2 10*3/uL — ABNORMAL HIGH (ref 4.0–10.5)
nRBC: 0 % (ref 0.0–0.2)

## 2023-05-23 MED ORDER — PROMETHAZINE HCL 25 MG PO TABS
12.5000 mg | ORAL_TABLET | Freq: Once | ORAL | Status: AC
  Administered 2023-05-23: 12.5 mg via ORAL
  Filled 2023-05-23: qty 1

## 2023-05-23 MED ORDER — OXYCODONE-ACETAMINOPHEN 5-325 MG PO TABS
1.0000 | ORAL_TABLET | Freq: Once | ORAL | Status: AC
  Administered 2023-05-23: 1 via ORAL
  Filled 2023-05-23: qty 1

## 2023-05-23 NOTE — ED Provider Triage Note (Signed)
 Emergency Medicine Provider Triage Evaluation Note  [name] [name] , a 88 y.o. female  was evaluated in triage.  Pt complains of pain following fall. She was going into bedroom when she got tangled in bed sheet on floor and fell onto right side. No LOC, head injury, complaints prior to fall, thinner.  Complains of right hip, right thigh, right hand pain following fall  Review of Systems  Positive: pain Negative: loc  Physical Exam  BP 112/61 (BP Location: Left Arm)   Pulse (!) 56   Temp 98 F (36.7 C) (Oral)   Resp 17   Wt 66.7 kg   SpO2 100%   BMI 27.33 kg/m  Gen:   Awake, no distress   Resp:  Normal effort  MSK:   Moves extremities without difficulty  Other:  TTP of right hip and thigh. TTP of lumbar midspinous processes Swelling to anterior right hand  Medical Decision Making  Medically screening exam initiated at 7:32 PM.  Appropriate orders placed.  [name] [name] was informed that the remainder of the evaluation will be completed by another provider, this initial triage assessment does not replace that evaluation, and the importance of remaining in the ED until their evaluation is complete.  Labs and imaging ordered.   [name] Tinnie BRAVO, PA 05/23/23 1935

## 2023-05-23 NOTE — ED Triage Notes (Signed)
 Pt had mechanical fall this afternoon; did not hit head, no loc; not on thinners; c/o R hand , R hip, R neck pain; pt ambulatory after fall; bruising and swelling to R hand  Verbal consent given for mse

## 2023-05-24 MED ORDER — OXYCODONE-ACETAMINOPHEN 5-325 MG PO TABS
1.0000 | ORAL_TABLET | Freq: Once | ORAL | Status: AC
  Administered 2023-05-24: 1 via ORAL
  Filled 2023-05-24: qty 1

## 2023-05-24 NOTE — ED Notes (Signed)
 Family decided to take pt home and follow up with own MD.

## 2023-05-29 ENCOUNTER — Emergency Department (HOSPITAL_COMMUNITY): Payer: PPO

## 2023-05-29 ENCOUNTER — Other Ambulatory Visit: Payer: Self-pay

## 2023-05-29 ENCOUNTER — Inpatient Hospital Stay (HOSPITAL_COMMUNITY)
Admission: EM | Admit: 2023-05-29 | Discharge: 2023-06-03 | DRG: 481 | Disposition: A | Discharge status: Skilled Nursing Facility | Payer: PPO | Attending: Internal Medicine | Admitting: Internal Medicine

## 2023-05-29 ENCOUNTER — Encounter (HOSPITAL_COMMUNITY): Payer: Self-pay

## 2023-05-29 DIAGNOSIS — Y92003 Bedroom of unspecified non-institutional (private) residence as the place of occurrence of the external cause: Secondary | ICD-10-CM

## 2023-05-29 DIAGNOSIS — I129 Hypertensive chronic kidney disease with stage 1 through stage 4 chronic kidney disease, or unspecified chronic kidney disease: Secondary | ICD-10-CM | POA: Diagnosis present

## 2023-05-29 DIAGNOSIS — Z7982 Long term (current) use of aspirin: Secondary | ICD-10-CM

## 2023-05-29 DIAGNOSIS — E440 Moderate protein-calorie malnutrition: Secondary | ICD-10-CM | POA: Insufficient documentation

## 2023-05-29 DIAGNOSIS — S72351A Displaced comminuted fracture of shaft of right femur, initial encounter for closed fracture: Secondary | ICD-10-CM | POA: Diagnosis present

## 2023-05-29 DIAGNOSIS — J45909 Unspecified asthma, uncomplicated: Secondary | ICD-10-CM | POA: Diagnosis present

## 2023-05-29 DIAGNOSIS — Z90710 Acquired absence of both cervix and uterus: Secondary | ICD-10-CM

## 2023-05-29 DIAGNOSIS — Z79899 Other long term (current) drug therapy: Secondary | ICD-10-CM | POA: Diagnosis not present

## 2023-05-29 DIAGNOSIS — Z800 Family history of malignant neoplasm of digestive organs: Secondary | ICD-10-CM

## 2023-05-29 DIAGNOSIS — S72001D Fracture of unspecified part of neck of right femur, subsequent encounter for closed fracture with routine healing: Secondary | ICD-10-CM | POA: Diagnosis not present

## 2023-05-29 DIAGNOSIS — Z881 Allergy status to other antibiotic agents status: Secondary | ICD-10-CM | POA: Diagnosis not present

## 2023-05-29 DIAGNOSIS — Z8673 Personal history of transient ischemic attack (TIA), and cerebral infarction without residual deficits: Secondary | ICD-10-CM

## 2023-05-29 DIAGNOSIS — Z888 Allergy status to other drugs, medicaments and biological substances status: Secondary | ICD-10-CM

## 2023-05-29 DIAGNOSIS — W19XXXA Unspecified fall, initial encounter: Principal | ICD-10-CM

## 2023-05-29 DIAGNOSIS — I10 Essential (primary) hypertension: Secondary | ICD-10-CM | POA: Diagnosis present

## 2023-05-29 DIAGNOSIS — Z8261 Family history of arthritis: Secondary | ICD-10-CM

## 2023-05-29 DIAGNOSIS — D72829 Elevated white blood cell count, unspecified: Secondary | ICD-10-CM | POA: Diagnosis present

## 2023-05-29 DIAGNOSIS — E785 Hyperlipidemia, unspecified: Secondary | ICD-10-CM | POA: Diagnosis present

## 2023-05-29 DIAGNOSIS — N183 Chronic kidney disease, stage 3 unspecified: Secondary | ICD-10-CM | POA: Diagnosis present

## 2023-05-29 DIAGNOSIS — W1839XA Other fall on same level, initial encounter: Secondary | ICD-10-CM | POA: Diagnosis present

## 2023-05-29 DIAGNOSIS — F32A Depression, unspecified: Secondary | ICD-10-CM | POA: Diagnosis present

## 2023-05-29 DIAGNOSIS — I252 Old myocardial infarction: Secondary | ICD-10-CM | POA: Diagnosis not present

## 2023-05-29 DIAGNOSIS — M25551 Pain in right hip: Secondary | ICD-10-CM | POA: Diagnosis present

## 2023-05-29 DIAGNOSIS — K219 Gastro-esophageal reflux disease without esophagitis: Secondary | ICD-10-CM | POA: Diagnosis present

## 2023-05-29 DIAGNOSIS — S72001A Fracture of unspecified part of neck of right femur, initial encounter for closed fracture: Secondary | ICD-10-CM | POA: Diagnosis present

## 2023-05-29 DIAGNOSIS — Z808 Family history of malignant neoplasm of other organs or systems: Secondary | ICD-10-CM | POA: Diagnosis not present

## 2023-05-29 DIAGNOSIS — S7221XA Displaced subtrochanteric fracture of right femur, initial encounter for closed fracture: Principal | ICD-10-CM | POA: Diagnosis present

## 2023-05-29 DIAGNOSIS — E871 Hypo-osmolality and hyponatremia: Secondary | ICD-10-CM | POA: Diagnosis present

## 2023-05-29 DIAGNOSIS — G629 Polyneuropathy, unspecified: Secondary | ICD-10-CM | POA: Diagnosis present

## 2023-05-29 DIAGNOSIS — Z7951 Long term (current) use of inhaled steroids: Secondary | ICD-10-CM

## 2023-05-29 DIAGNOSIS — M810 Age-related osteoporosis without current pathological fracture: Secondary | ICD-10-CM | POA: Diagnosis present

## 2023-05-29 DIAGNOSIS — N182 Chronic kidney disease, stage 2 (mild): Secondary | ICD-10-CM | POA: Diagnosis not present

## 2023-05-29 DIAGNOSIS — Z8249 Family history of ischemic heart disease and other diseases of the circulatory system: Secondary | ICD-10-CM | POA: Diagnosis not present

## 2023-05-29 DIAGNOSIS — R110 Nausea: Secondary | ICD-10-CM | POA: Diagnosis not present

## 2023-05-29 DIAGNOSIS — F419 Anxiety disorder, unspecified: Secondary | ICD-10-CM | POA: Diagnosis present

## 2023-05-29 DIAGNOSIS — Z955 Presence of coronary angioplasty implant and graft: Secondary | ICD-10-CM

## 2023-05-29 DIAGNOSIS — K449 Diaphragmatic hernia without obstruction or gangrene: Secondary | ICD-10-CM | POA: Diagnosis present

## 2023-05-29 DIAGNOSIS — I2510 Atherosclerotic heart disease of native coronary artery without angina pectoris: Secondary | ICD-10-CM | POA: Diagnosis present

## 2023-05-29 LAB — TYPE AND SCREEN
ABO/RH(D): O POS
Antibody Screen: NEGATIVE

## 2023-05-29 LAB — BASIC METABOLIC PANEL
Anion gap: 12 (ref 5–15)
BUN: 19 mg/dL (ref 8–23)
CO2: 22 mmol/L (ref 22–32)
Calcium: 9.2 mg/dL (ref 8.9–10.3)
Chloride: 104 mmol/L (ref 98–111)
Creatinine, Ser: 0.59 mg/dL (ref 0.44–1.00)
GFR, Estimated: >60 mL/min (ref >60)
Glucose, Bld: 139 mg/dL — ABNORMAL HIGH (ref 70–99)
Potassium: 3.9 mmol/L (ref 3.5–5.1)
Sodium: 138 mmol/L (ref 135–145)

## 2023-05-29 LAB — CBC WITH DIFFERENTIAL/PLATELET
Abs Immature Granulocytes: 0.11 10*3/uL — ABNORMAL HIGH (ref 0.00–0.07)
Basophils Absolute: 0.1 10*3/uL (ref 0.0–0.1)
Basophils Relative: 1 %
Eosinophils Absolute: 0.2 10*3/uL (ref 0.0–0.5)
Eosinophils Relative: 1 %
HCT: 40.7 % (ref 36.0–46.0)
Hemoglobin: 12.9 g/dL (ref 12.0–15.0)
Immature Granulocytes: 1 %
Lymphocytes Relative: 7 %
Lymphs Abs: 1.1 10*3/uL (ref 0.7–4.0)
MCH: 30.7 pg (ref 26.0–34.0)
MCHC: 31.7 g/dL (ref 30.0–36.0)
MCV: 96.9 fL (ref 80.0–100.0)
Monocytes Absolute: 1.4 10*3/uL — ABNORMAL HIGH (ref 0.1–1.0)
Monocytes Relative: 9 %
Neutro Abs: 13.2 10*3/uL — ABNORMAL HIGH (ref 1.7–7.7)
Neutrophils Relative %: 81 %
Platelets: 272 10*3/uL (ref 150–400)
RBC: 4.2 MIL/uL (ref 3.87–5.11)
RDW: 14.1 % (ref 11.5–15.5)
WBC: 16.1 10*3/uL — ABNORMAL HIGH (ref 4.0–10.5)
nRBC: 0 % (ref 0.0–0.2)

## 2023-05-29 LAB — PROTIME-INR
INR: 0.9 (ref 0.8–1.2)
Prothrombin Time: 12.6 s (ref 11.4–15.2)

## 2023-05-29 MED ORDER — MUPIROCIN 2 % EX OINT
1.0000 | TOPICAL_OINTMENT | Freq: Two times a day (BID) | CUTANEOUS | Status: AC
  Administered 2023-05-29 – 2023-06-03 (×9): 1 via NASAL
  Filled 2023-05-29 (×2): qty 22

## 2023-05-29 MED ORDER — ACETAMINOPHEN 325 MG PO TABS
650.0000 mg | ORAL_TABLET | Freq: Four times a day (QID) | ORAL | Status: DC | PRN
  Administered 2023-06-02 – 2023-06-03 (×2): 650 mg via ORAL
  Filled 2023-05-29 (×2): qty 2

## 2023-05-29 MED ORDER — ONDANSETRON HCL 4 MG/2ML IJ SOLN
4.0000 mg | Freq: Once | INTRAMUSCULAR | Status: AC
  Administered 2023-05-29: 4 mg via INTRAVENOUS
  Filled 2023-05-29: qty 2

## 2023-05-29 MED ORDER — FENTANYL CITRATE PF 50 MCG/ML IJ SOSY
50.0000 ug | PREFILLED_SYRINGE | INTRAMUSCULAR | Status: AC | PRN
  Administered 2023-05-29 (×2): 50 ug via INTRAVENOUS
  Filled 2023-05-29 (×2): qty 1

## 2023-05-29 MED ORDER — HYDROMORPHONE HCL 1 MG/ML IJ SOLN
0.5000 mg | Freq: Once | INTRAMUSCULAR | Status: AC
  Administered 2023-05-29: 0.5 mg via INTRAVENOUS
  Filled 2023-05-29: qty 1

## 2023-05-29 MED ORDER — HYDROMORPHONE HCL 1 MG/ML IJ SOLN
0.5000 mg | INTRAMUSCULAR | Status: DC | PRN
  Administered 2023-05-29 – 2023-06-01 (×11): 1 mg via INTRAVENOUS
  Filled 2023-05-29 (×11): qty 1

## 2023-05-29 MED ORDER — HYDROMORPHONE HCL 1 MG/ML IJ SOLN
1.0000 mg | Freq: Once | INTRAMUSCULAR | Status: AC
  Administered 2023-05-29: 1 mg via INTRAVENOUS
  Filled 2023-05-29: qty 1

## 2023-05-29 MED ORDER — AMLODIPINE BESYLATE 10 MG PO TABS
10.0000 mg | ORAL_TABLET | Freq: Every day | ORAL | Status: DC
  Administered 2023-05-29 – 2023-06-03 (×6): 10 mg via ORAL
  Filled 2023-05-29 (×4): qty 1
  Filled 2023-05-29 (×2): qty 2

## 2023-05-29 MED ORDER — ACETAMINOPHEN 650 MG RE SUPP: 650.0000 mg | Freq: Four times a day (QID) | RECTAL | Status: DC | PRN

## 2023-05-29 MED ORDER — MOMETASONE FURO-FORMOTEROL FUM 200-5 MCG/ACT IN AERO
2.0000 | INHALATION_SPRAY | Freq: Two times a day (BID) | RESPIRATORY_TRACT | Status: DC
  Administered 2023-05-29 – 2023-06-03 (×10): 2 via RESPIRATORY_TRACT
  Filled 2023-05-29: qty 8.8

## 2023-05-29 MED ORDER — SENNOSIDES-DOCUSATE SODIUM 8.6-50 MG PO TABS
2.0000 | ORAL_TABLET | Freq: Two times a day (BID) | ORAL | Status: DC
  Administered 2023-05-29 – 2023-06-03 (×11): 2 via ORAL
  Filled 2023-05-29 (×11): qty 2

## 2023-05-29 MED ORDER — PANTOPRAZOLE SODIUM 40 MG PO TBEC
40.0000 mg | DELAYED_RELEASE_TABLET | Freq: Every day | ORAL | Status: DC
  Administered 2023-05-30 – 2023-06-03 (×5): 40 mg via ORAL
  Filled 2023-05-29 (×5): qty 1

## 2023-05-29 MED ORDER — ALBUTEROL SULFATE (2.5 MG/3ML) 0.083% IN NEBU
2.5000 mg | INHALATION_SOLUTION | RESPIRATORY_TRACT | Status: DC | PRN
Start: 2023-05-29 — End: 2023-06-04

## 2023-05-29 MED ORDER — SUCRALFATE 1 G PO TABS
1.0000 g | ORAL_TABLET | Freq: Three times a day (TID) | ORAL | Status: DC
  Administered 2023-05-29 – 2023-06-03 (×18): 1 g via ORAL
  Filled 2023-05-29 (×18): qty 1

## 2023-05-29 MED ORDER — ONDANSETRON HCL 4 MG PO TABS
4.0000 mg | ORAL_TABLET | Freq: Four times a day (QID) | ORAL | Status: DC | PRN
Start: 2023-05-29 — End: 2023-06-02
  Administered 2023-05-31: 4 mg via ORAL
  Filled 2023-05-29: qty 1

## 2023-05-29 MED ORDER — ENOXAPARIN SODIUM 40 MG/0.4ML IJ SOSY
40.0000 mg | PREFILLED_SYRINGE | Freq: Once | INTRAMUSCULAR | Status: AC
  Administered 2023-05-29: 40 mg via SUBCUTANEOUS
  Filled 2023-05-29: qty 0.4

## 2023-05-29 MED ORDER — ASPIRIN 81 MG PO TBEC
81.0000 mg | DELAYED_RELEASE_TABLET | ORAL | Status: DC
  Administered 2023-05-30 – 2023-06-03 (×3): 81 mg via ORAL
  Filled 2023-05-29 (×3): qty 1

## 2023-05-29 MED ORDER — HYDROMORPHONE HCL 1 MG/ML IJ SOLN
1.0000 mg | Freq: Once | INTRAMUSCULAR | Status: AC
Start: 2023-05-29 — End: 2023-05-29
  Administered 2023-05-29: 1 mg via INTRAVENOUS
  Filled 2023-05-29: qty 1

## 2023-05-29 MED ORDER — BUSPIRONE HCL 5 MG PO TABS
5.0000 mg | ORAL_TABLET | Freq: Three times a day (TID) | ORAL | Status: DC | PRN
  Administered 2023-06-02: 5 mg via ORAL
  Filled 2023-05-29: qty 1

## 2023-05-29 MED ORDER — MELATONIN 5 MG PO TABS
5.0000 mg | ORAL_TABLET | Freq: Every evening | ORAL | Status: DC | PRN
  Administered 2023-06-02: 5 mg via ORAL
  Filled 2023-05-29: qty 1

## 2023-05-29 MED ORDER — OXYCODONE HCL 5 MG PO TABS
10.0000 mg | ORAL_TABLET | ORAL | Status: DC | PRN
Start: 2023-05-29 — End: 2023-05-30
  Filled 2023-05-29: qty 2

## 2023-05-29 MED ORDER — ONDANSETRON HCL 4 MG/2ML IJ SOLN
4.0000 mg | Freq: Four times a day (QID) | INTRAMUSCULAR | Status: DC | PRN
Start: 2023-05-29 — End: 2023-06-02
  Administered 2023-05-29 – 2023-06-01 (×7): 4 mg via INTRAVENOUS
  Filled 2023-05-29 (×7): qty 2

## 2023-05-29 MED ORDER — ROPINIROLE HCL 0.5 MG PO TABS
1.0000 mg | ORAL_TABLET | Freq: Every day | ORAL | Status: DC
Start: 2023-05-29 — End: 2023-06-04
  Administered 2023-05-29 – 2023-06-03 (×6): 1 mg via ORAL
  Filled 2023-05-29 (×6): qty 2

## 2023-05-29 MED ORDER — GABAPENTIN 300 MG PO CAPS
600.0000 mg | ORAL_CAPSULE | Freq: Three times a day (TID) | ORAL | Status: DC | PRN
  Administered 2023-06-02: 600 mg via ORAL
  Filled 2023-05-29: qty 2

## 2023-05-29 MED ORDER — FESOTERODINE FUMARATE ER 4 MG PO TB24
4.0000 mg | ORAL_TABLET | Freq: Every day | ORAL | Status: DC
  Administered 2023-05-30 – 2023-06-03 (×5): 4 mg via ORAL
  Filled 2023-05-29 (×5): qty 1

## 2023-05-29 MED ORDER — PROPRANOLOL HCL 20 MG PO TABS
20.0000 mg | ORAL_TABLET | Freq: Two times a day (BID) | ORAL | Status: DC
  Administered 2023-05-29 – 2023-06-03 (×11): 20 mg via ORAL
  Filled 2023-05-29 (×11): qty 1

## 2023-05-29 NOTE — ED Triage Notes (Signed)
Pt BIB EMS from home with reports of a fall. Pt has right hip deformity and bruising. Pt had a fall last week as well. No blood thinner, pt denies hitting her head.

## 2023-05-29 NOTE — ED Notes (Signed)
[facility] called. 

## 2023-05-29 NOTE — ED Provider Notes (Signed)
[facility] AT [facility] Provider Note   CSN: [id number] Arrival date & time: 05/29/23  0845     History  Chief Complaint  Patient presents with   Fall   Hip Pain    [name] [name] is a 88 y.o. female.  Patient is an 88 year old female with a history of CAD, hyperlipidemia, hypertension, chronic back pain, and stage III CKD who is presenting today with severe right hip pain.  Patient reports that she fell this morning.  She got up and missed the handle of her walker and fell onto her right side.  Patient does report on Tuesday she fell on that side also but she has been able to ambulate.  Today she was not able to get up and reports the pain is 10 out of 10 in her right hip.  She is unable to move it without having severe pain.  Also having some pain down by the right knee.  She denies hitting her head or loss of consciousness.  She does not take anticoagulation.  She denies any chest pain, shortness of breath or vomiting.  The history is provided by the patient and medical records.  Fall  Hip Pain       Home Medications Prior to Admission medications   Medication Sig Start Date End Date Taking? Authorizing Provider  albuterol (PROVENTIL) (2.5 MG/3ML) 0.083% nebulizer solution Take 3 mLs (2.5 mg total) by nebulization every 4 (four) hours as needed for wheezing or shortness of breath. 01/28/22   Nyoka Cowden, MD  albuterol (VENTOLIN HFA) 108 (90 Base) MCG/ACT inhaler INHALE 2 PUFFS BY MOUTH EVERY 4 HOURS AS NEEDED FOR WHEEZING FOR SHORTNESS OF BREATH 05/15/23   Nyoka Cowden, MD  amLODipine (NORVASC) 10 MG tablet Take  1 tablet Daikly for BP & Heart                                                                        /                                                                   TAKE                                         BY                                                 MOUTH 12/16/22   Lucky Cowboy, MD  aspirin EC 81 MG tablet Take 81  mg by mouth at bedtime. Every other night    [provider]  azithromycin (ZITHROMAX) 250 MG tablet Take 2 tablets (500 mg) on  Day 1,  followed by 1 tablet (250 mg) once daily on Days 2  through 5. 05/12/23   Raynelle Dick, NP  benzonatate (TESSALON PERLES) 100 MG capsule Take 1 capsule (100 mg total) by mouth every 6 (six) hours as needed for cough. 05/12/23 05/11/24  Raynelle Dick, NP  budesonide (PULMICORT) 0.25 MG/2ML nebulizer solution Take 2 mLs (0.25 mg total) by nebulization 4 (four) times daily. 07/12/22   Nyoka Cowden, MD  busPIRone (BUSPAR) 10 MG tablet Take 1/2 to 1 tablet 3 x /day if Needed for Anxiety or Panic Attack  (( Replaces Xanax )) 10/15/22   Lucky Cowboy, MD  Cholecalciferol (VITAMIN D3) 5000 units CAPS Take 5,000 Units by mouth 2 (two) times daily.     [provider]  dexamethasone (DECADRON) 4 MG tablet Take 3 tabs for 3 days, 2 tabs for 3 days 1 tab for 5 days. Take with food. 05/12/23   Raynelle Dick, NP  gabapentin (NEURONTIN) 600 MG tablet Take  1/2 to 1 tablet  3 to 4 x /Daily  as needed for Pain 07/06/22   Lucky Cowboy, MD  ketoconazole (NIZORAL) 2 % cream APPLY TO RINGWORM  RASH TWICE A DAY 01/18/23   Adela Glimpse, NP  Magnesium 400 MG TABS Take 400 mg by mouth daily at 2 PM.    [provider]  melatonin 5 MG TABS Take 5 mg by mouth at bedtime as needed.    [provider]  mometasone-formoterol (DULERA) 200-5 MCG/ACT AERO Take 2 puffs first thing in am and then another 2 puffs about 12 hours later. 09/08/22   Nyoka Cowden, MD  nitroGLYCERIN (NITROSTAT) 0.4 MG SL tablet Place 1 tablet (0.4 mg total) under the tongue every 5 (five) minutes as needed for chest pain. 09/04/14   Laurey Morale, MD  pantoprazole (PROTONIX) 40 MG tablet Take  1 tablet  Daily  to Prevent Heartburn &  Indigestion 02/13/23   Lucky Cowboy, MD  promethazine (PHENERGAN) 25 MG tablet Take  1 tablet   4 x / day  before Meals & Bedtime  as needed  for Nausea 02/13/23   Lucky Cowboy, MD  propranolol (INDERAL) 20 MG tablet Take 1 tablet (20 mg total) by mouth 2 (two) times daily. 01/23/23 01/23/24  Adela Glimpse, NP  rOPINIRole (REQUIP) 2 MG tablet Take 0.5 tablets (1 mg total) by mouth at bedtime. 03/09/23   Raynelle Dick, NP  senna-docusate (SENOKOT-S) 8.6-50 MG tablet Take 2 tablets by mouth 2 (two) times daily. 12/31/19   Rodolph Bong, MD  solifenacin (VESICARE) 5 MG tablet Take 5 mg by mouth daily. 07/11/22   [provider]  sucralfate (CARAFATE) 1 g tablet Take 1 g by mouth 4 (four) times daily -  with meals and at bedtime.    [provider]  vitamin B-12 (CYANOCOBALAMIN) 1000 MCG tablet Take 5,000 mcg by mouth daily.    [provider]      Allergies    Ace inhibitors, Aspartame, Atorvastatin, Biaxin [clarithromycin], Daliresp [roflumilast], Erythromycin, and Levofloxacin    Review of Systems   Review of Systems  Physical Exam Updated Vital Signs BP (!) 149/81 (BP Location: Left Arm)   Pulse 71   Temp 98.5 F (36.9 C) (Oral)   Resp (!) 22   SpO2 98%  Physical Exam Vitals and nursing note reviewed.  Constitutional:      General: She is not in acute distress.    Appearance: She is well-developed.     Comments: Appears uncomfortable  HENT:  Head: Normocephalic and atraumatic.  Eyes:     Pupils: Pupils are equal, round, and reactive to light.  Cardiovascular:     Rate and Rhythm: Normal rate and regular rhythm.     Heart sounds: Normal heart sounds. No murmur heard.    No friction rub.  Pulmonary:     Effort: Pulmonary effort is normal.     Breath sounds: Normal breath sounds. No wheezing or rales.  Abdominal:     General: Bowel sounds are normal. There is no distension.     Palpations: Abdomen is soft.     Tenderness: There is abdominal tenderness in the right lower quadrant. There is no guarding or rebound.     Comments: No significant CVA tenderness.  No midline back  tenderness.  Musculoskeletal:        General: Tenderness present. No deformity. Normal range of motion.     Cervical back: Neck supple. No tenderness.     Right lower leg: No edema.     Left lower leg: No edema.     Comments: No edema.  Large area of ecchymosis over the right hip which is approximately 10 x 10.  Severe pain when attempting to move the left hip.  Mild pain with palpation in the distal femur and lateral portion of the proximal knee.  No pain at the ankle with sensation of the right foot intact.  Normal movement of the toes.  2+ DP pulses bilaterally.  Full range of motion of the left hip without pain.  Bilateral upper extremities are normal.  No cervical spine tenderness.  Skin:    General: Skin is warm and dry.     Findings: No rash.  Neurological:     Mental Status: She is alert and oriented to person, place, and time.     Cranial Nerves: No cranial nerve deficit.  Psychiatric:        Behavior: Behavior normal.     ED Results / Procedures / Treatments   Labs (all labs ordered are listed, but only abnormal results are displayed) Labs Reviewed  BASIC METABOLIC PANEL - Abnormal; Notable for the following components:      Result Value   Glucose, Bld 139 (*)    All other components within normal limits  CBC WITH DIFFERENTIAL/PLATELET - Abnormal; Notable for the following components:   WBC 16.1 (*)    Neutro Abs 13.2 (*)    Monocytes Absolute 1.4 (*)    Abs Immature Granulocytes 0.11 (*)    All other components within normal limits  PROTIME-INR  TYPE AND SCREEN    EKG EKG Interpretation Date/Time:  Monday May 29 2023 09:34:59 EST Ventricular Rate:  75 PR Interval:  135 QRS Duration:  119 QT Interval:  422 QTC Calculation: 472 R Axis:   49  Text Interpretation: Sinus rhythm Nonspecific intraventricular conduction delay Low voltage, precordial leads Nonspecific T abnormalities, lateral leads Borderline ST elevation, anterolateral leads No significant change  since last tracing Confirmed by Gwyneth Sprout ([id number]) on 05/29/2023 10:25:10 AM  Radiology No results found.  Procedures Procedures    Medications Ordered in ED Medications  HYDROmorphone (DILAUDID) injection 1 mg (has no administration in time range)  fentaNYL (SUBLIMAZE) injection 50 mcg (50 mcg Intravenous Given 05/29/23 1000)  ondansetron (ZOFRAN) injection 4 mg (4 mg Intravenous Given 05/29/23 0930)  HYDROmorphone (DILAUDID) injection 1 mg (1 mg Intravenous Given 05/29/23 1041)  HYDROmorphone (DILAUDID) injection 0.5 mg (0.5 mg Intravenous Given 05/29/23 1115)    ED  Course/ Medical Decision Making/ A&P                                 Medical Decision Making Amount and/or Complexity of Data Reviewed External Data Reviewed: notes. Labs: ordered. Decision-making details documented in ED Course. Radiology: ordered and independent interpretation performed. Decision-making details documented in ED Course. ECG/medicine tests: ordered and independent interpretation performed. Decision-making details documented in ED Course.  Risk Prescription drug management. Decision regarding hospitalization.   Pt with multiple medical problems and comorbidities and presenting today with a complaint that caries a high risk for morbidity and mortality.  Here today after a fall with severe pain in her right hip.  Patient has evidence of recent fall and bruising to the hip but then fell again today.  She denies any head injury or loss of consciousness.  Seems mechanical as she missed the handle on her walker.  She denies any presyncopal or syncopal complaints.  Does not appear to have any injury to the left side of her body.  Low suspicion for head injury or cervical injury.  Patient given pain control.  Imaging pending.  If plain films are negative patient will need CT to evaluate for occult fracture of the hip or back  1:13 PM I independently interpreted patient's labs and EKG.  EKG without acute  findings.  CBC with leukocytosis of 16,000 which is most likely acute phase reaction, INR within normal limits, BMP without acute findings.  I have independently visualized and interpreted pt's images today.  Hip imaging today consistent with a right femur fracture with displacement.  No distal femur fractures noted.  Will consult orthopedics and patient will need admission.  Discussed the findings with the patient and her husband.  They are comfortable with this plan and she would like to move forward with surgery.         Final Clinical Impression(s) / ED Diagnoses Final diagnoses:  Fall, initial encounter  Closed displaced comminuted fracture of shaft of right femur, initial encounter [facility])    Rx / DC Orders ED Discharge Orders     None         Gwyneth Sprout, MD 05/29/23 1313

## 2023-05-29 NOTE — Care Plan (Signed)
Orthopaedic Surgery Plan of Care Note   -history and imaging reviewed with primary team (ER) -pt has right pertroch femur fx (multiple falls, unclear if this is from today's fall or earlier in the week) -PMH includes CKD 3, CAD, HTN, HLD -admit to [facility] team -please keep NPO and hold VTE ppx from [place] -d/w Ortho Trauma colleagues who are kindly helping manage this pt for possible OR 05/30/23   Netta Cedars, MD Orthopaedic Surgery [facility]

## 2023-05-29 NOTE — H&P (Addendum)
History and Physical  AEDYN LYCETT LZJ:[id number] DOB: Dec 14, 1935 DOA: 05/29/2023  PCP: Lucky Cowboy, MD   Chief Complaint: Fall at home, right hip fracture  HPI: [name] [name] is a 88 y.o. female with medical history significant for hypertension, depression, CKD stage III, hiatal hernia, CAD being admitted to the hospital with closed right hip fracture after traumatic fall at home earlier today.  She was actually evaluated in the ER for mechanical fall at home on 1/14, x-rays at that time did not show any fracture.  This morning, she got out of bed went to reach for her walker, missed it and fell to the floor.  Did not hit her head, denies losing consciousness.  Denies any recent illness or any other complaints.  Review of Systems: Please see HPI for pertinent positives and negatives. A complete 10 system review of systems are otherwise negative.  Past Medical History:  Diagnosis Date   Anxiety    takes Xanax daily as needed   Asthma    Albuterol daily as needed   Blood transfusion without reported diagnosis    CAD (coronary artery disease)    LHC 2003 with 40% pLAD, 30% mLAD, 100% D1 (moderate vessel), 100%  D2 (small vessel), 95% mid small OM2.  Pt had PTCA to D1;  D2 and OM2 small vessels and tx medically;  Last Myoview (2012): anterior infarct seen with scar, no ischemia. EF normal. Med Rx // Myoview (12/14):  Low risk; ant scar w/ peri-infarct ischemia; EF 55% with ant AK // Myoview 5/16: EF 55-65, ant, apical scar, no ischemia    Cataract    Chronic back pain    HNP, DDD, spinal stenosis, vertebral compression fractures.    Chronic nausea    takes Zofran daily as needed.  normal gastric emptying study 03/2013   CKD (chronic kidney disease), stage III (HCC) 09/27/2017   Constipation    felt to be functional. takes Senokot and Miralax daily.  treated with Linzess in past.    DIVERTICULOSIS, COLON 08/28/2008   Qualifier: Diagnosis of  By: Bascom Levels, [name], [name]     Elevated LFTs  2015   probably med induced DILI, from Augmentin vs Pravastatin   GERD (gastroesophageal reflux disease)    hx of esophageal stricture    Hiatal hernia    Paraesophageal hernias. s/p fundoplications in 2013 and 04/2013   History of bronchitis several of yrs ago   History of colon polyps 03/2009, 03/2011   adenomatous colon polyps, no high grade dysplasia.    History of vertigo    no meds   Hyperlipidemia    takes Pravastatin daily   Hypertension    takes Amlodipine,Metoprolol,and Losartan daily   Multiple closed pelvic fractures without disruption of pelvic circle (HCC)    Nausea 03/2016   Osteoporosis    PONV (postoperative nausea and vomiting)    yrs ago   Pubic ramus fracture (HCC) 12/20/2019   Slow urinary stream    takes Rapaflo daily   TIA (transient ischemic attack) 1995   no residual effects noted   Vertebral compression fracture (HCC) 06/23/2012   Past Surgical History:  Procedure Laterality Date   APPENDECTOMY     patient unsure of date   BACK SURGERY     BLADDER REPAIR     CHOLECYSTECTOMY     Patient unsure of date.   CORONARY ANGIOPLASTY  11/16/2001   1 stent   ESOPHAGEAL MANOMETRY N/A 03/25/2013   Procedure: ESOPHAGEAL MANOMETRY (EM);  Surgeon:  Louis Meckel, MD;  Location: [facility];  Service: Endoscopy;  Laterality: N/A;   ESOPHAGOGASTRODUODENOSCOPY N/A 03/15/2013   Procedure: ESOPHAGOGASTRODUODENOSCOPY (EGD);  Surgeon: Beverley Fiedler, MD;  Location: [facility] ENDOSCOPY;  Service: Endoscopy;  Laterality: N/A;   ESOPHAGOGASTRODUODENOSCOPY N/A 12/25/2015   Procedure: ESOPHAGOGASTRODUODENOSCOPY (EGD);  Surgeon: Sherrilyn Rist, MD;  Location: [facility] ENDOSCOPY;  Service: Endoscopy;  Laterality: N/A;   EYE SURGERY  11/2000   bilateral cataracts with lens implant   FIXATION KYPHOPLASTY LUMBAR SPINE  X 2   "L3-4"   HEMORRHOID SURGERY     HIATAL HERNIA REPAIR  06/03/2011   Procedure: LAPAROSCOPIC REPAIR OF HIATAL HERNIA;  Surgeon: Ernestene Mention, MD;  Location: [facility] ORS;   Service: General;  Laterality: N/A;   HIATAL HERNIA REPAIR N/A 04/24/2013   Procedure: LAPAROSCOPIC  REPAIR RECURRENT PARASOPHAGEAL HIATAL HERNIA WITH FUNDOPLICATION;  Surgeon: Ardeth Sportsman, MD;  Location: [facility] ORS;  Service: General;  Laterality: N/A;   INSERTION OF MESH N/A 04/24/2013   Procedure: INSERTION OF MESH;  Surgeon: Ardeth Sportsman, MD;  Location: [facility];  Service: General;  Laterality: N/A;   IR KYPHO THORACIC WITH BONE BIOPSY  09/29/2017   IR KYPHO THORACIC WITH BONE BIOPSY  11/03/2017   IR KYPHO THORACIC WITH BONE BIOPSY  02/14/2019   IR KYPHO THORACIC WITH BONE BIOPSY  04/02/2019   LAPAROSCOPIC LYSIS OF ADHESIONS N/A 04/24/2013   Procedure: LAPAROSCOPIC LYSIS OF ADHESIONS;  Surgeon: Ardeth Sportsman, MD;  Location: [facility];  Service: General;  Laterality: N/A;   LAPAROSCOPIC NISSEN FUNDOPLICATION  06/03/2011   Procedure: LAPAROSCOPIC NISSEN FUNDOPLICATION;  Surgeon: Ernestene Mention, MD;  Location: [facility] OR[facility];  Service: General;  Laterality: N/A;   LUMBAR FUSION  12/07/2015   Right L2-3 facetectomy, posterolateral fusion L2-3 with pedicle screws, rods, local bone graft, Vivigen cancellous chips. Fusion extended to the L1 level with pedicle screws and rods   LUMBAR LAMINECTOMY/DECOMPRESSION MICRODISCECTOMY Right 06/26/2012   Procedure: LUMBAR LAMINECTOMY/DECOMPRESSION MICRODISCECTOMY;  Surgeon: Kerrin Champagne, MD;  Location: [facility] OR;  Service: Orthopedics;  Laterality: Right;  RIGHT L4-5 MICRODISCECTOMY   LUMBAR LAMINECTOMY/DECOMPRESSION MICRODISCECTOMY N/A 05/29/2015   Procedure: RIGHT L2-3 MICRODISCECTOMY;  Surgeon: Kerrin Champagne, MD;  Location: [facility] OR;  Service: Orthopedics;  Laterality: N/A;   TOTAL ABDOMINAL HYSTERECTOMY  1975   partial   UPPER GASTROINTESTINAL ENDOSCOPY     Social History:  reports that she has never smoked. She has never used smokeless tobacco. She reports that she does not drink alcohol and does not use drugs.  Allergies  Allergen Reactions   Ace Inhibitors Cough     Per Dr Sherene Sires pulmonology 2013   Aspartame Diarrhea and Nausea And Vomiting   Atorvastatin Other (See Comments)    Muscle aches   Biaxin [Clarithromycin] Other (See Comments)    Unknown reaction : PATIENT CAN TOLERATE Z-PAK.  Is written on patient's paper chart.   Daliresp [Roflumilast] Nausea And Vomiting   Erythromycin Other (See Comments)    Unknown reaction: PATIENT CAN TOLERATE Z-PAK.  It is written on patient's paper chart.   Levofloxacin Nausea And Vomiting    Family History  Problem Relation Age of Onset   Colon cancer Mother 79   Heart attack Father 53       heart attack   Brain cancer Brother        tumor    Heart disease Brother    Ulcers Brother    Arthritis Brother    Kidney  disease Daughter    Esophageal cancer Neg Hx    Stomach cancer Neg Hx      Prior to Admission medications   Medication Sig Start Date End Date Taking? Authorizing Provider  albuterol (PROVENTIL) (2.5 MG/3ML) 0.083% nebulizer solution Take 3 mLs (2.5 mg total) by nebulization every 4 (four) hours as needed for wheezing or shortness of breath. 01/28/22  Yes Nyoka Cowden, MD  albuterol (VENTOLIN HFA) 108 (90 Base) MCG/ACT inhaler INHALE 2 PUFFS BY MOUTH EVERY 4 HOURS AS NEEDED FOR WHEEZING FOR SHORTNESS OF BREATH Patient taking differently: Inhale 2 puffs into the lungs as needed for wheezing or shortness of breath. 05/15/23  Yes Nyoka Cowden, MD  traMADol (ULTRAM) 50 MG tablet Take 50 mg by mouth every 6 (six) hours as needed. 01/12/23  Yes [provider]  amLODipine (NORVASC) 10 MG tablet Take  1 tablet Daikly for BP & Heart                                                                        /                                                                   TAKE                                         BY                                                 MOUTH Patient taking differently: Take 10 mg by mouth daily. 12/16/22   Lucky Cowboy, MD  aspirin EC 81 MG tablet Take 81 mg by  mouth at bedtime. Every other night    [provider]  azithromycin (ZITHROMAX) 250 MG tablet Take 2 tablets (500 mg) on  Day 1,  followed by 1 tablet (250 mg) once daily on Days 2 through 5. 05/12/23   Raynelle Dick, NP  benzonatate (TESSALON PERLES) 100 MG capsule Take 1 capsule (100 mg total) by mouth every 6 (six) hours as needed for cough. 05/12/23 05/11/24  Raynelle Dick, NP  budesonide (PULMICORT) 0.25 MG/2ML nebulizer solution Take 2 mLs (0.25 mg total) by nebulization 4 (four) times daily. 07/12/22   Nyoka Cowden, MD  busPIRone (BUSPAR) 10 MG tablet Take 1/2 to 1 tablet 3 x /day if Needed for Anxiety or Panic Attack  (( Replaces Xanax )) 10/15/22   Lucky Cowboy, MD  Cholecalciferol (VITAMIN D3) 5000 units CAPS Take 5,000 Units by mouth 2 (two) times daily.     [provider]  dexamethasone (DECADRON) 4 MG tablet Take 3 tabs for 3 days, 2 tabs for 3 days 1 tab for 5 days. Take with food. 05/12/23  Raynelle Dick, NP  gabapentin (NEURONTIN) 600 MG tablet Take  1/2 to 1 tablet  3 to 4 x /Daily  as needed for Pain 07/06/22   Lucky Cowboy, MD  ketoconazole (NIZORAL) 2 % cream APPLY TO RINGWORM  RASH TWICE A DAY 01/18/23   Adela Glimpse, NP  Magnesium 400 MG TABS Take 400 mg by mouth daily at 2 PM.    [provider]  melatonin 5 MG TABS Take 5 mg by mouth at bedtime as needed.    [provider]  mometasone-formoterol (DULERA) 200-5 MCG/ACT AERO Take 2 puffs first thing in am and then another 2 puffs about 12 hours later. 09/08/22   Nyoka Cowden, MD  nitroGLYCERIN (NITROSTAT) 0.4 MG SL tablet Place 1 tablet (0.4 mg total) under the tongue every 5 (five) minutes as needed for chest pain. 09/04/14   Laurey Morale, MD  pantoprazole (PROTONIX) 40 MG tablet Take  1 tablet  Daily  to Prevent Heartburn &  Indigestion 02/13/23   Lucky Cowboy, MD  promethazine (PHENERGAN) 25 MG tablet Take  1 tablet   4 x / day  before Meals & Bedtime  as needed for  Nausea 02/13/23   Lucky Cowboy, MD  propranolol (INDERAL) 20 MG tablet Take 1 tablet (20 mg total) by mouth 2 (two) times daily. 01/23/23 01/23/24  Adela Glimpse, NP  rOPINIRole (REQUIP) 2 MG tablet Take 0.5 tablets (1 mg total) by mouth at bedtime. 03/09/23   Raynelle Dick, NP  senna-docusate (SENOKOT-S) 8.6-50 MG tablet Take 2 tablets by mouth 2 (two) times daily. 12/31/19   Rodolph Bong, MD  solifenacin (VESICARE) 5 MG tablet Take 5 mg by mouth daily. 07/11/22   [provider]  sucralfate (CARAFATE) 1 g tablet Take 1 g by mouth 4 (four) times daily -  with meals and at bedtime.    [provider]  vitamin B-12 (CYANOCOBALAMIN) 1000 MCG tablet Take 5,000 mcg by mouth daily.    [provider]    Physical Exam: BP (!) 150/74   Pulse 93   Temp 99.2 F (37.3 C) (Oral)   Resp 14   SpO2 (!) 89%  General:  Alert, oriented, calm, in no acute distress, thin elderly female appearing her stated age.  Her husband is at the bedside. Cardiovascular: RRR, no murmurs or rubs, no peripheral edema  Respiratory: clear to auscultation bilaterally, no wheezes, no crackles  Abdomen: soft, nontender, nondistended, normal bowel tones heard  Skin: dry, no rashes  Musculoskeletal: no joint effusions, normal range of motion  Psychiatric: appropriate affect, normal speech  Neurologic: extraocular muscles intact, clear speech, moving all extremities with intact sensorium         Labs on Admission:  Basic Metabolic Panel: Recent Labs  Lab 05/29/23 0940  NA 138  K 3.9  CL 104  CO2 22  GLUCOSE 139*  BUN 19  CREATININE 0.59  CALCIUM 9.2   Liver Function Tests: No results for input(s): "AST", "ALT", "ALKPHOS", "BILITOT", "PROT", "ALBUMIN" in the last 168 hours. No results for input(s): "LIPASE", "AMYLASE" in the last 168 hours. No results for input(s): "AMMONIA" in the last 168 hours. CBC: Recent Labs  Lab 05/23/23 1944 05/29/23 0940  WBC 20.2* 16.1*   NEUTROABS  --  13.2*  HGB 13.7 12.9  HCT 41.4 40.7  MCV 96.3 96.9  PLT 364 272   Cardiac Enzymes: No results for input(s): "CKTOTAL", "CKMB", "CKMBINDEX", "TROPONINI" in the last 168 hours. BNP (  last 3 results) No results for input(s): "BNP" in the last 8760 hours.  ProBNP (last 3 results) No results for input(s): "PROBNP" in the last 8760 hours.  CBG: No results for input(s): "GLUCAP" in the last 168 hours.  Radiological Exams on Admission: DG Hip Unilat W or Wo Pelvis 2-3 Views Right Result Date: 05/29/2023 CLINICAL DATA:  Right hip deformity status post fall. EXAM: DG HIP (WITH OR WITHOUT PELVIS) 2-3V RIGHT; RIGHT FEMUR 2 VIEWS COMPARISON:  Right hip radiographs dated May 23, 2023. CT abdomen/pelvis dated August 05, 2022. FINDINGS: Acute impacted and displaced intertrochanteric fracture of the proximal right femur. There is approximately 9 mm of lateral displacement of the distal segment. The right femoral head is seated within the acetabulum. Remote left superior and inferior pubic rami fractures. Remote right inferior pubic ramus fracture. Postoperative changes of the visualized lumbar spine with prior vertebral augmentation. The sacroiliac joints and pubic symphysis appear anatomically aligned. The left hip joint is anatomically aligned. The knee joint is anatomically aligned with chondrocalcinosis in the medial and lateral femorotibial compartments. IMPRESSION: Acute displaced and foreshortened intertrochanteric fracture of the proximal right femur. Electronically Signed   By: Hart Robinsons M.D.   On: 05/29/2023 14:06   DG FEMUR, MIN 2 VIEWS RIGHT Result Date: 05/29/2023 CLINICAL DATA:  Right hip deformity status post fall. EXAM: DG HIP (WITH OR WITHOUT PELVIS) 2-3V RIGHT; RIGHT FEMUR 2 VIEWS COMPARISON:  Right hip radiographs dated May 23, 2023. CT abdomen/pelvis dated August 05, 2022. FINDINGS: Acute impacted and displaced intertrochanteric fracture of the proximal right  femur. There is approximately 9 mm of lateral displacement of the distal segment. The right femoral head is seated within the acetabulum. Remote left superior and inferior pubic rami fractures. Remote right inferior pubic ramus fracture. Postoperative changes of the visualized lumbar spine with prior vertebral augmentation. The sacroiliac joints and pubic symphysis appear anatomically aligned. The left hip joint is anatomically aligned. The knee joint is anatomically aligned with chondrocalcinosis in the medial and lateral femorotibial compartments. IMPRESSION: Acute displaced and foreshortened intertrochanteric fracture of the proximal right femur. Electronically Signed   By: Hart Robinsons M.D.   On: 05/29/2023 14:06   Assessment/Plan JOICE DONZE is a 88 y.o. female with medical history significant for hypertension, depression, CKD stage III, hiatal hernia, CAD being admitted to the hospital with closed right hip fracture after traumatic fall at home earlier today.  Right hip fracture-after traumatic fall at home.  Displaced and foreshortened intertrochanteric fracture of the right proximal femur.  Per orthopedic surgery, they recommend surgical repair with Ortho trauma surgeons at [facility] -Inpatient admission to [facility] -Pain and nausea control -Regular diet now, n.p.o. after midnight  Leukocytosis-I suspect this is reactive to her recent trauma  Anxiety-BuSpar as needed  Hypertension-Norvasc 10 mg p.o. daily, Inderal  GERD-Protonix, Carafate  Peripheral neuropathy-Neurontin as needed  DVT prophylaxis: Lovenox x 1 this afternoon, will hold tomorrow for surgery    Code Status: Full Code  Consults called: Orthopedic surgery Dr. Odis Hollingshead  Admission status: The appropriate patient status for this patient is INPATIENT. Inpatient status is judged to be reasonable and necessary in order to provide the required intensity of service to ensure the patient's safety. The patient's  presenting symptoms, physical exam findings, and initial radiographic and laboratory data in the context of their chronic comorbidities is felt to place them at high risk for further clinical deterioration. Furthermore, it is not anticipated that the patient will be medically stable  for discharge from the hospital within 2 midnights of admission.    I certify that at the point of admission it is my clinical judgment that the patient will require inpatient hospital care spanning beyond 2 midnights from the point of admission due to high intensity of service, high risk for further deterioration and high frequency of surveillance required  Time spent: 58 minutes  Capucine Tryon Sharlette Dense MD [facility] Pager [phone]  If 7PM-7AM, please contact night-coverage [email or web address] Password [email or web address]  05/29/2023, 2:24 PM

## 2023-05-29 NOTE — Consult Note (Signed)
Patient ID: FLYNN PLATA MRN: [id number] DOB/AGE: 86-Aug-1937 86 y.o.  Admit date: 05/29/2023  Admission Diagnoses:  Principal Problem:   Closed right hip fracture (HCC)   HPI: Ortho consult for right pertrochanteric proximal femur fracture. Pt has had multiple falls over past week and unclear as to exact date of sustaining this injury. Reports that earlier today morning, she missed the handle of her walker and fell on to the right side.   PMH notable for CAD, hyperlipidemia, hypertension, chronic back pain, and stage III CKD .  Denies numbness/tingling.  Past Medical History: Past Medical History:  Diagnosis Date   Anxiety    takes Xanax daily as needed   Asthma    Albuterol daily as needed   Blood transfusion without reported diagnosis    CAD (coronary artery disease)    LHC 2003 with 40% pLAD, 30% mLAD, 100% D1 (moderate vessel), 100%  D2 (small vessel), 95% mid small OM2.  Pt had PTCA to D1;  D2 and OM2 small vessels and tx medically;  Last Myoview (2012): anterior infarct seen with scar, no ischemia. EF normal. Med Rx // Myoview (12/14):  Low risk; ant scar w/ peri-infarct ischemia; EF 55% with ant AK // Myoview 5/16: EF 55-65, ant, apical scar, no ischemia    Cataract    Chronic back pain    HNP, DDD, spinal stenosis, vertebral compression fractures.    Chronic nausea    takes Zofran daily as needed.  normal gastric emptying study 03/2013   CKD (chronic kidney disease), stage III (HCC) 09/27/2017   Constipation    felt to be functional. takes Senokot and Miralax daily.  treated with Linzess in past.    DIVERTICULOSIS, COLON 08/28/2008   Qualifier: Diagnosis of  By: Bascom Levels, [name], [name]     Elevated LFTs 2015   probably med induced DILI, from Augmentin vs Pravastatin   GERD (gastroesophageal reflux disease)    hx of esophageal stricture    Hiatal hernia    Paraesophageal hernias. s/p fundoplications in 2013 and 04/2013   History of bronchitis several of yrs ago    History of colon polyps 03/2009, 03/2011   adenomatous colon polyps, no high grade dysplasia.    History of vertigo    no meds   Hyperlipidemia    takes Pravastatin daily   Hypertension    takes Amlodipine,Metoprolol,and Losartan daily   Multiple closed pelvic fractures without disruption of pelvic circle (HCC)    Nausea 03/2016   Osteoporosis    PONV (postoperative nausea and vomiting)    yrs ago   Pubic ramus fracture (HCC) 12/20/2019   Slow urinary stream    takes Rapaflo daily   TIA (transient ischemic attack) 1995   no residual effects noted   Vertebral compression fracture (HCC) 06/23/2012    Surgical History: Past Surgical History:  Procedure Laterality Date   APPENDECTOMY     patient unsure of date   BACK SURGERY     BLADDER REPAIR     CHOLECYSTECTOMY     Patient unsure of date.   CORONARY ANGIOPLASTY  11/16/2001   1 stent   ESOPHAGEAL MANOMETRY N/A 03/25/2013   Procedure: ESOPHAGEAL MANOMETRY (EM);  Surgeon: Louis Meckel, MD;  Location: [facility];  Service: Endoscopy;  Laterality: N/A;   ESOPHAGOGASTRODUODENOSCOPY N/A 03/15/2013   Procedure: ESOPHAGOGASTRODUODENOSCOPY (EGD);  Surgeon: Beverley Fiedler, MD;  Location: [facility] ENDOSCOPY;  Service: Endoscopy;  Laterality: N/A;   ESOPHAGOGASTRODUODENOSCOPY N/A 12/25/2015   Procedure: ESOPHAGOGASTRODUODENOSCOPY (  EGD);  Surgeon: Sherrilyn Rist, MD;  Location: [facility] ENDOSCOPY;  Service: Endoscopy;  Laterality: N/A;   EYE SURGERY  11/2000   bilateral cataracts with lens implant   FIXATION KYPHOPLASTY LUMBAR SPINE  X 2   "L3-4"   HEMORRHOID SURGERY     HIATAL HERNIA REPAIR  06/03/2011   Procedure: LAPAROSCOPIC REPAIR OF HIATAL HERNIA;  Surgeon: Ernestene Mention, MD;  Location: [facility] ORS;  Service: General;  Laterality: N/A;   HIATAL HERNIA REPAIR N/A 04/24/2013   Procedure: LAPAROSCOPIC  REPAIR RECURRENT PARASOPHAGEAL HIATAL HERNIA WITH FUNDOPLICATION;  Surgeon: Ardeth Sportsman, MD;  Location: [facility] ORS;  Service: General;   Laterality: N/A;   INSERTION OF MESH N/A 04/24/2013   Procedure: INSERTION OF MESH;  Surgeon: Ardeth Sportsman, MD;  Location: [facility] ORS;  Service: General;  Laterality: N/A;   IR KYPHO THORACIC WITH BONE BIOPSY  09/29/2017   IR KYPHO THORACIC WITH BONE BIOPSY  11/03/2017   IR KYPHO THORACIC WITH BONE BIOPSY  02/14/2019   IR KYPHO THORACIC WITH BONE BIOPSY  04/02/2019   LAPAROSCOPIC LYSIS OF ADHESIONS N/A 04/24/2013   Procedure: LAPAROSCOPIC LYSIS OF ADHESIONS;  Surgeon: Ardeth Sportsman, MD;  Location: [facility];  Service: General;  Laterality: N/A;   LAPAROSCOPIC NISSEN FUNDOPLICATION  06/03/2011   Procedure: LAPAROSCOPIC NISSEN FUNDOPLICATION;  Surgeon: Ernestene Mention, MD;  Location: [facility] ORS;  Service: General;  Laterality: N/A;   LUMBAR FUSION  12/07/2015   Right L2-3 facetectomy, posterolateral fusion L2-3 with pedicle screws, rods, local bone graft, Vivigen cancellous chips. Fusion extended to the L1 level with pedicle screws and rods   LUMBAR LAMINECTOMY/DECOMPRESSION MICRODISCECTOMY Right 06/26/2012   Procedure: LUMBAR LAMINECTOMY/DECOMPRESSION MICRODISCECTOMY;  Surgeon: Kerrin Champagne, MD;  Location[facility];  Service: Orthopedics;  Laterality: Right;  RIGHT L4-5 MICRODISCECTOMY   LUMBAR LAMINECTOMY/DECOMPRESSION MICRODISCECTOMY N/A 05/29/2015   Procedure: RIGHT L2-3 MICRODISCECTOMY;  Surgeon: Kerrin Champagne, MD;  Location: [facility] OR;  Service: Orthopedics;  Laterality: N/A;   TOTAL ABDOMINAL HYSTERECTOMY  1975   partial   UPPER GASTROINTESTINAL ENDOSCOPY      Family History: Family History  Problem Relation Age of Onset   Colon cancer Mother 58   Heart attack Father 7       heart attack   Brain cancer Brother        tumor    Heart disease Brother    Ulcers Brother    Arthritis Brother    Kidney disease Daughter    Esophageal cancer Neg Hx    Stomach cancer Neg Hx     Social History: Social History   Socioeconomic History   Marital status: Married    Spouse name: [name]   Number of  children: 2   Years of education: 12   Highest education level: Not on file  Occupational History   Occupation: Retired    Associate Professor: RETIRED    Comment: worked at [employer]  Tobacco Use   Smoking status: Never   Smokeless tobacco: Never  Vaping Use   Vaping status: Never Used  Substance and Sexual Activity   Alcohol use: No   Drug use: Never   Sexual activity: Not Currently    Partners: Male    Birth control/protection: Post-menopausal, Surgical  Other Topics Concern   Not on file  Social History Narrative   Lives with husband.   Right-handed.   3 cups caffeine per day.   Social Drivers of Corporate investment banker Strain: Not  on file  Food Insecurity: No Food Insecurity (05/29/2023)   Hunger Vital Sign    Worried About Running Out of Food in the Last Year: Never true    Ran Out of Food in the Last Year: Never true  Transportation Needs: No Transportation Needs (05/29/2023)   PRAPARE - Administrator, Civil Service (Medical): No    Lack of Transportation (Non-Medical): No  Physical Activity: Not on file  Stress: Not on file  Social Connections: Socially Integrated (05/29/2023)   Social Connection and Isolation Panel [NHANES]    Frequency of Communication with Friends and Family: Three times a week    Frequency of Social Gatherings with Friends and Family: Twice a week    Attends Religious Services: More than 4 times per year    Active Member of [employer] or Organizations: Yes    Attends Banker Meetings: 1 to 4 times per year    Marital Status: Married  Catering manager Violence: Not At Risk (05/29/2023)   Humiliation, Afraid, Rape, and Kick questionnaire    Fear of Current or Ex-Partner: No    Emotionally Abused: No    Physically Abused: No    Sexually Abused: No    Allergies: Ace inhibitors, Aspartame, Atorvastatin, Biaxin [clarithromycin], Daliresp [roflumilast], Erythromycin, and Levofloxacin  Medications: I have reviewed the  patient's current medications.  Vital Signs: Patient Vitals for the past 24 hrs:  BP Temp Temp src Pulse Resp SpO2 Height Weight  05/29/23 2149 -- -- -- -- -- 98 % -- --  05/29/23 2015 (!) 173/68 98.1 F (36.7 C) -- 95 20 98 % -- --  05/29/23 1832 -- -- -- -- -- -- 5\' 1"  (1.549 m) 73.6 kg  05/29/23 1823 (!) 160/67 98.5 F (36.9 C) Oral 100 20 99 % -- --  05/29/23 1630 (!) 150/81 -- -- 88 19 99 % -- --  05/29/23 1600 127/69 -- -- 91 16 96 % -- --  05/29/23 1530 131/68 -- -- 87 17 97 % -- --  05/29/23 1500 (!) 144/70 -- -- 93 (!) 25 95 % -- --  05/29/23 1404 -- 99.2 F (37.3 C) Oral -- -- -- -- --  05/29/23 1300 (!) 150/74 -- -- 93 14 (!) 89 % -- --  05/29/23 0854 (!) 149/81 98.5 F (36.9 C) Oral 71 (!) 22 98 % -- --    Radiology: DG Hip Unilat W or Wo Pelvis 2-3 Views Right Result Date: 05/29/2023 CLINICAL DATA:  Right hip deformity status post fall. EXAM: DG HIP (WITH OR WITHOUT PELVIS) 2-3V RIGHT; RIGHT FEMUR 2 VIEWS COMPARISON:  Right hip radiographs dated May 23, 2023. CT abdomen/pelvis dated August 05, 2022. FINDINGS: Acute impacted and displaced intertrochanteric fracture of the proximal right femur. There is approximately 9 mm of lateral displacement of the distal segment. The right femoral head is seated within the acetabulum. Remote left superior and inferior pubic rami fractures. Remote right inferior pubic ramus fracture. Postoperative changes of the visualized lumbar spine with prior vertebral augmentation. The sacroiliac joints and pubic symphysis appear anatomically aligned. The left hip joint is anatomically aligned. The knee joint is anatomically aligned with chondrocalcinosis in the medial and lateral femorotibial compartments. IMPRESSION: Acute displaced and foreshortened intertrochanteric fracture of the proximal right femur. Electronically Signed   By: Hart Robinsons M.D.   On: 05/29/2023 14:06   DG FEMUR, MIN 2 VIEWS RIGHT Result Date: 05/29/2023 CLINICAL DATA:   Right hip deformity status post fall. EXAM: DG  HIP (WITH OR WITHOUT PELVIS) 2-3V RIGHT; RIGHT FEMUR 2 VIEWS COMPARISON:  Right hip radiographs dated May 23, 2023. CT abdomen/pelvis dated August 05, 2022. FINDINGS: Acute impacted and displaced intertrochanteric fracture of the proximal right femur. There is approximately 9 mm of lateral displacement of the distal segment. The right femoral head is seated within the acetabulum. Remote left superior and inferior pubic rami fractures. Remote right inferior pubic ramus fracture. Postoperative changes of the visualized lumbar spine with prior vertebral augmentation. The sacroiliac joints and pubic symphysis appear anatomically aligned. The left hip joint is anatomically aligned. The knee joint is anatomically aligned with chondrocalcinosis in the medial and lateral femorotibial compartments. IMPRESSION: Acute displaced and foreshortened intertrochanteric fracture of the proximal right femur. Electronically Signed   By: Hart Robinsons M.D.   On: 05/29/2023 14:06   DG Hand Complete Right Result Date: 05/23/2023 CLINICAL DATA:  Fall, right hand pain EXAM: RIGHT HAND - COMPLETE 3+ VIEW COMPARISON:  None Available. FINDINGS: Mildly displaced oblique 4th metacarpal shaft fracture. Mild degenerative changes of the second and 3rd DIP joints. Chondrocalcinosis in the TFCC. Mild dorsal soft tissue swelling. IMPRESSION: Mildly displaced oblique 4th metacarpal shaft fracture, as above. Electronically Signed   By: Charline Bills M.D.   On: 05/23/2023 20:59   DG Hip Unilat W or Wo Pelvis 2-3 Views Right Result Date: 05/23/2023 CLINICAL DATA:  Fall, right hip pain EXAM: DG HIP (WITH OR WITHOUT PELVIS) 2-3V RIGHT COMPARISON:  None Available. FINDINGS: No fracture or dislocation is seen. Bilateral hip joint spaces are preserved. Suspected old left pelvic ring fractures. Visualized bony pelvis is otherwise intact. IMPRESSION: Negative. Electronically Signed   By: Charline Bills M.D.   On: 05/23/2023 20:58   DG Lumbar Spine Complete Result Date: 05/23/2023 CLINICAL DATA:  Fall, back pain EXAM: LUMBAR SPINE - COMPLETE 4+ VIEW COMPARISON:  None Available. FINDINGS: Five lumbar-type vertebral bodies. Status post PLIF at L1-3. Multilevel vertebral augmentation at T9-10, T11-12, and L2-4. No evidence of acute fracture or dislocation. Mild multilevel degenerative changes. Visualized bony pelvis appears intact. IMPRESSION: No evidence of acute fracture or dislocation. Multilevel postsurgical/postprocedural changes, as above. Electronically Signed   By: Charline Bills M.D.   On: 05/23/2023 20:57    Labs: Recent Labs    05/29/23 0940  WBC 16.1*  RBC 4.20  HCT 40.7  PLT 272   Recent Labs    05/29/23 0940  NA 138  K 3.9  CL 104  CO2 22  BUN 19  CREATININE 0.59  GLUCOSE 139*  CALCIUM 9.2   Recent Labs    05/29/23 1000  INR 0.9    Review of Systems: ROS as detailed in HPI  Physical Exam: Body mass index is 30.66 kg/m.  Physical Exam   Gen: AAOx3, NAD Comfortable at rest  Right Lower Extremity: Skin intact, ecchymosis around proximal femur TTP over proximal femur ADF/APF/EHL 5/5 SILT throughout DP, PT 2+ to palp CR < 2s   Assessment and Plan: Ortho consult for right pertrochanteric proximal femur fracture  -history, exam and imaging reviewed at length -surgery in the form of right femur ORIF (IMN) -discussed case with Ortho Trauma colleagues who have kindly agreed to manage this patient with planned surgery 05/30/23 -please keep NPO and hold VTE ppx from MN -PT/OT postop  Netta Cedars, MD Orthopaedic Surgeon [facility] [phone]  The risks and benefits were presented and reviewed. The risks of surgery include hardware failure/irritation, new/persistent/recurrent infection, stiffness, nerve/vessel/tendon injury, nonunion/malunion of any  fracture, wound healing issues, allograft usage, development of arthritis,  failure of this surgery, possibility of delayed definitive surgery, need for further surgery, prolonged wound care including further soft tissue coverage procedures, thromboembolic events, anesthesia/medical complications/events perioperatively and beyond, amputation, death among others were discussed. The patient agreed to proceed with the plan.

## 2023-05-30 ENCOUNTER — Inpatient Hospital Stay (HOSPITAL_COMMUNITY): Payer: PPO | Admitting: Anesthesiology

## 2023-05-30 ENCOUNTER — Encounter (HOSPITAL_COMMUNITY): Payer: Self-pay | Admitting: Internal Medicine

## 2023-05-30 ENCOUNTER — Inpatient Hospital Stay (HOSPITAL_COMMUNITY): Payer: PPO

## 2023-05-30 ENCOUNTER — Other Ambulatory Visit: Payer: Self-pay

## 2023-05-30 ENCOUNTER — Encounter (HOSPITAL_COMMUNITY)
Admission: EM | Disposition: A | Discharge status: Skilled Nursing Facility | Payer: Self-pay | Source: Home / Self Care | Attending: Internal Medicine

## 2023-05-30 DIAGNOSIS — S72001D Fracture of unspecified part of neck of right femur, subsequent encounter for closed fracture with routine healing: Secondary | ICD-10-CM | POA: Diagnosis not present

## 2023-05-30 DIAGNOSIS — I129 Hypertensive chronic kidney disease with stage 1 through stage 4 chronic kidney disease, or unspecified chronic kidney disease: Secondary | ICD-10-CM | POA: Diagnosis not present

## 2023-05-30 DIAGNOSIS — I2510 Atherosclerotic heart disease of native coronary artery without angina pectoris: Secondary | ICD-10-CM | POA: Diagnosis not present

## 2023-05-30 DIAGNOSIS — S72001A Fracture of unspecified part of neck of right femur, initial encounter for closed fracture: Secondary | ICD-10-CM | POA: Diagnosis not present

## 2023-05-30 DIAGNOSIS — N182 Chronic kidney disease, stage 2 (mild): Secondary | ICD-10-CM | POA: Diagnosis not present

## 2023-05-30 HISTORY — PX: INTRAMEDULLARY (IM) NAIL INTERTROCHANTERIC: SHX5875

## 2023-05-30 LAB — CBC
HCT: 38.3 % (ref 36.0–46.0)
Hemoglobin: 12.6 g/dL (ref 12.0–15.0)
MCH: 32 pg (ref 26.0–34.0)
MCHC: 32.9 g/dL (ref 30.0–36.0)
MCV: 97.2 fL (ref 80.0–100.0)
Platelets: 272 10*3/uL (ref 150–400)
RBC: 3.94 MIL/uL (ref 3.87–5.11)
RDW: 14.1 % (ref 11.5–15.5)
WBC: 11.8 10*3/uL — ABNORMAL HIGH (ref 4.0–10.5)
nRBC: 0 % (ref 0.0–0.2)

## 2023-05-30 LAB — BASIC METABOLIC PANEL
Anion gap: 8 (ref 5–15)
BUN: 15 mg/dL (ref 8–23)
CO2: 26 mmol/L (ref 22–32)
Calcium: 8.8 mg/dL — ABNORMAL LOW (ref 8.9–10.3)
Chloride: 103 mmol/L (ref 98–111)
Creatinine, Ser: 0.67 mg/dL (ref 0.44–1.00)
GFR, Estimated: >60 mL/min (ref >60)
Glucose, Bld: 129 mg/dL — ABNORMAL HIGH (ref 70–99)
Potassium: 4.1 mmol/L (ref 3.5–5.1)
Sodium: 137 mmol/L (ref 135–145)

## 2023-05-30 LAB — SURGICAL PCR SCREEN
MRSA, PCR: NEGATIVE
Staphylococcus aureus: NEGATIVE

## 2023-05-30 SURGERY — FIXATION, FRACTURE, INTERTROCHANTERIC, WITH INTRAMEDULLARY ROD
Anesthesia: General | Laterality: Right

## 2023-05-30 MED ORDER — 0.9 % SODIUM CHLORIDE (POUR BTL) OPTIME
TOPICAL | Status: DC | PRN
  Administered 2023-05-30: 1000 mL

## 2023-05-30 MED ORDER — CEFAZOLIN SODIUM-DEXTROSE 2-4 GM/100ML-% IV SOLN
2.0000 g | Freq: Four times a day (QID) | INTRAVENOUS | Status: AC
Start: 2023-05-30 — End: 2023-05-31
  Administered 2023-05-30 – 2023-05-31 (×2): 2 g via INTRAVENOUS
  Filled 2023-05-30 (×2): qty 100

## 2023-05-30 MED ORDER — PHENOL 1.4 % MT LIQD: 1.0000 | OROMUCOSAL | Status: DC | PRN

## 2023-05-30 MED ORDER — FENTANYL CITRATE (PF) 250 MCG/5ML IJ SOLN
INTRAMUSCULAR | Status: AC
  Filled 2023-05-30: qty 5

## 2023-05-30 MED ORDER — ACETAMINOPHEN 325 MG PO TABS
650.0000 mg | ORAL_TABLET | Freq: Three times a day (TID) | ORAL | Status: DC
  Administered 2023-05-30 – 2023-06-03 (×11): 650 mg via ORAL
  Filled 2023-05-30 (×12): qty 2

## 2023-05-30 MED ORDER — PROPOFOL 10 MG/ML IV BOLUS
INTRAVENOUS | Status: DC | PRN
  Administered 2023-05-30: 80 mg via INTRAVENOUS

## 2023-05-30 MED ORDER — EPHEDRINE 5 MG/ML INJ
INTRAVENOUS | Status: AC
  Filled 2023-05-30: qty 5

## 2023-05-30 MED ORDER — SUCCINYLCHOLINE CHLORIDE 200 MG/10ML IV SOSY
PREFILLED_SYRINGE | INTRAVENOUS | Status: DC | PRN
  Administered 2023-05-30: 80 mg via INTRAVENOUS

## 2023-05-30 MED ORDER — PHENYLEPHRINE HCL-NACL 20-0.9 MG/250ML-% IV SOLN
INTRAVENOUS | Status: DC | PRN
Start: 2023-05-30 — End: 2023-05-30
  Administered 2023-05-30: 25 ug/min via INTRAVENOUS

## 2023-05-30 MED ORDER — METOCLOPRAMIDE HCL 5 MG/ML IJ SOLN
5.0000 mg | Freq: Three times a day (TID) | INTRAMUSCULAR | Status: DC | PRN
  Administered 2023-06-02: 10 mg via INTRAVENOUS
  Filled 2023-05-30: qty 2

## 2023-05-30 MED ORDER — OXYCODONE HCL 5 MG/5ML PO SOLN: 5.0000 mg | Freq: Once | ORAL | Status: DC | PRN

## 2023-05-30 MED ORDER — LACTATED RINGERS IV SOLN: INTRAVENOUS | Status: DC

## 2023-05-30 MED ORDER — DEXAMETHASONE SODIUM PHOSPHATE 10 MG/ML IJ SOLN
INTRAMUSCULAR | Status: DC | PRN
  Administered 2023-05-30: 10 mg via INTRAVENOUS

## 2023-05-30 MED ORDER — PROPOFOL 10 MG/ML IV BOLUS
INTRAVENOUS | Status: AC
  Filled 2023-05-30: qty 20

## 2023-05-30 MED ORDER — SUGAMMADEX SODIUM 200 MG/2ML IV SOLN
INTRAVENOUS | Status: DC | PRN
  Administered 2023-05-30: 200 mg via INTRAVENOUS

## 2023-05-30 MED ORDER — PROPOFOL 500 MG/50ML IV EMUL
INTRAVENOUS | Status: DC | PRN
  Administered 2023-05-30: 75 ug/kg/min via INTRAVENOUS

## 2023-05-30 MED ORDER — PHENYLEPHRINE 80 MCG/ML (10ML) SYRINGE FOR IV PUSH (FOR BLOOD PRESSURE SUPPORT)
PREFILLED_SYRINGE | INTRAVENOUS | Status: DC | PRN
Start: 2023-05-30 — End: 2023-05-30
  Administered 2023-05-30 (×3): 80 ug via INTRAVENOUS

## 2023-05-30 MED ORDER — FENTANYL CITRATE (PF) 250 MCG/5ML IJ SOLN
INTRAMUSCULAR | Status: DC | PRN
  Administered 2023-05-30: 50 ug via INTRAVENOUS

## 2023-05-30 MED ORDER — ROCURONIUM BROMIDE 10 MG/ML (PF) SYRINGE
PREFILLED_SYRINGE | INTRAVENOUS | Status: DC | PRN
  Administered 2023-05-30: 20 mg via INTRAVENOUS
  Administered 2023-05-30: 40 mg via INTRAVENOUS
  Administered 2023-05-30: 20 mg via INTRAVENOUS

## 2023-05-30 MED ORDER — OXYCODONE HCL 5 MG PO TABS: 5.0000 mg | ORAL_TABLET | Freq: Once | ORAL | Status: DC | PRN

## 2023-05-30 MED ORDER — ORAL CARE MOUTH RINSE: 15.0000 mL | Freq: Once | OROMUCOSAL | Status: DC

## 2023-05-30 MED ORDER — TRANEXAMIC ACID-NACL 1000-0.7 MG/100ML-% IV SOLN
1000.0000 mg | INTRAVENOUS | Status: AC
  Administered 2023-05-30: 1000 mg via INTRAVENOUS
  Filled 2023-05-30: qty 100

## 2023-05-30 MED ORDER — OXYCODONE HCL 5 MG PO TABS
5.0000 mg | ORAL_TABLET | ORAL | Status: DC | PRN
  Administered 2023-05-31: 10 mg via ORAL
  Administered 2023-05-31: 5 mg via ORAL
  Administered 2023-06-01 – 2023-06-02 (×2): 10 mg via ORAL
  Filled 2023-05-30 (×2): qty 2
  Filled 2023-05-30: qty 1
  Filled 2023-05-30: qty 2

## 2023-05-30 MED ORDER — DEXAMETHASONE SODIUM PHOSPHATE 10 MG/ML IJ SOLN
INTRAMUSCULAR | Status: AC
  Filled 2023-05-30: qty 1

## 2023-05-30 MED ORDER — ROCURONIUM BROMIDE 10 MG/ML (PF) SYRINGE
PREFILLED_SYRINGE | INTRAVENOUS | Status: AC
  Filled 2023-05-30: qty 10

## 2023-05-30 MED ORDER — LIDOCAINE 2% (20 MG/ML) 5 ML SYRINGE
INTRAMUSCULAR | Status: AC
  Filled 2023-05-30: qty 5

## 2023-05-30 MED ORDER — FENTANYL CITRATE (PF) 100 MCG/2ML IJ SOLN: 25.0000 ug | INTRAMUSCULAR | Status: DC | PRN

## 2023-05-30 MED ORDER — METOCLOPRAMIDE HCL 5 MG PO TABS
5.0000 mg | ORAL_TABLET | Freq: Three times a day (TID) | ORAL | Status: DC | PRN
  Administered 2023-06-01: 5 mg via ORAL
  Filled 2023-05-30: qty 1

## 2023-05-30 MED ORDER — SUCCINYLCHOLINE CHLORIDE 200 MG/10ML IV SOSY
PREFILLED_SYRINGE | INTRAVENOUS | Status: AC
  Filled 2023-05-30: qty 10

## 2023-05-30 MED ORDER — ONDANSETRON HCL 4 MG/2ML IJ SOLN
INTRAMUSCULAR | Status: AC
  Filled 2023-05-30: qty 2

## 2023-05-30 MED ORDER — CEFAZOLIN SODIUM-DEXTROSE 2-4 GM/100ML-% IV SOLN
2.0000 g | INTRAVENOUS | Status: AC
  Administered 2023-05-30: 2 g via INTRAVENOUS
  Filled 2023-05-30: qty 100

## 2023-05-30 MED ORDER — PROMETHAZINE (PHENERGAN) 6.25MG IN NS 50ML IVPB
6.2500 mg | Freq: Once | INTRAVENOUS | Status: AC
Start: 2023-05-30 — End: 2023-05-30
  Administered 2023-05-30: 6.25 mg via INTRAVENOUS
  Filled 2023-05-30: qty 6.25

## 2023-05-30 MED ORDER — HYDRALAZINE HCL 25 MG PO TABS
25.0000 mg | ORAL_TABLET | Freq: Three times a day (TID) | ORAL | Status: DC
  Administered 2023-05-30 – 2023-06-03 (×13): 25 mg via ORAL
  Filled 2023-05-30 (×13): qty 1

## 2023-05-30 MED ORDER — AMISULPRIDE (ANTIEMETIC) 5 MG/2ML IV SOLN
INTRAVENOUS | Status: AC
  Filled 2023-05-30: qty 4

## 2023-05-30 MED ORDER — PHENYLEPHRINE 80 MCG/ML (10ML) SYRINGE FOR IV PUSH (FOR BLOOD PRESSURE SUPPORT)
PREFILLED_SYRINGE | INTRAVENOUS | Status: AC
  Filled 2023-05-30: qty 10

## 2023-05-30 MED ORDER — CHLORHEXIDINE GLUCONATE 0.12 % MT SOLN: 15.0000 mL | Freq: Once | OROMUCOSAL | Status: DC

## 2023-05-30 MED ORDER — ALBUMIN HUMAN 5 % IV SOLN: INTRAVENOUS | Status: DC | PRN

## 2023-05-30 MED ORDER — LIDOCAINE 2% (20 MG/ML) 5 ML SYRINGE
INTRAMUSCULAR | Status: DC | PRN
  Administered 2023-05-30: 40 mg via INTRAVENOUS

## 2023-05-30 MED ORDER — MENTHOL 3 MG MT LOZG: 1.0000 | LOZENGE | OROMUCOSAL | Status: DC | PRN

## 2023-05-30 MED ORDER — ENOXAPARIN SODIUM 40 MG/0.4ML IJ SOSY
40.0000 mg | PREFILLED_SYRINGE | INTRAMUSCULAR | Status: DC
  Administered 2023-05-31 – 2023-06-03 (×4): 40 mg via SUBCUTANEOUS
  Filled 2023-05-30 (×4): qty 0.4

## 2023-05-30 MED ORDER — CHLORHEXIDINE GLUCONATE 0.12 % MT SOLN
OROMUCOSAL | Status: AC
  Filled 2023-05-30: qty 15

## 2023-05-30 MED ORDER — DOCUSATE SODIUM 100 MG PO CAPS
100.0000 mg | ORAL_CAPSULE | Freq: Two times a day (BID) | ORAL | Status: DC
  Administered 2023-05-30 – 2023-06-03 (×9): 100 mg via ORAL
  Filled 2023-05-30 (×9): qty 1

## 2023-05-30 MED ORDER — AMISULPRIDE (ANTIEMETIC) 5 MG/2ML IV SOLN
10.0000 mg | Freq: Once | INTRAVENOUS | Status: AC | PRN
  Administered 2023-05-30: 10 mg via INTRAVENOUS

## 2023-05-30 SURGICAL SUPPLY — 50 items
BAG COUNTER SPONGE SURGICOUNT (BAG) ×2 IMPLANT
BIT DRILL CANN FLEX 14 (BIT) IMPLANT
BIT DRILL SHORT 4.2 (BIT) IMPLANT
BIT DRILL STEP 4.5X6.5 (BIT) IMPLANT
BRUSH SCRUB EZ PLAIN DRY (MISCELLANEOUS) ×4 IMPLANT
COVER PERINEAL POST (MISCELLANEOUS) ×2 IMPLANT
COVER SURGICAL LIGHT HANDLE (MISCELLANEOUS) ×4 IMPLANT
DRAPE C-ARM 42X72 X-RAY (DRAPES) ×2 IMPLANT
DRAPE C-ARMOR (DRAPES) ×2 IMPLANT
DRAPE INCISE IOBAN 66X45 STRL (DRAPES) ×2 IMPLANT
DRAPE SURG ORHT 6 SPLT 77X108 (DRAPES) IMPLANT
DRAPE U-SHAPE 47X51 STRL (DRAPES) ×2 IMPLANT
DRESSING MEPILEX FLEX 4X4 (GAUZE/BANDAGES/DRESSINGS) ×2 IMPLANT
DRSG EMULSION OIL 3X3 NADH (GAUZE/BANDAGES/DRESSINGS) ×2 IMPLANT
DRSG MEPILEX FLEX 4X4 (GAUZE/BANDAGES/DRESSINGS) ×2
DRSG MEPILEX POST OP 4X8 (GAUZE/BANDAGES/DRESSINGS) ×2 IMPLANT
ELECT REM PT RETURN 9FT ADLT (ELECTROSURGICAL) ×1
ELECTRODE REM PT RTRN 9FT ADLT (ELECTROSURGICAL) ×2 IMPLANT
GLOVE BIO SURGEON STRL SZ7.5 (GLOVE) ×2 IMPLANT
GLOVE BIO SURGEON STRL SZ8 (GLOVE) ×2 IMPLANT
GLOVE BIOGEL PI IND STRL 7.5 (GLOVE) ×2 IMPLANT
GLOVE BIOGEL PI IND STRL 8 (GLOVE) ×2 IMPLANT
GLOVE SURG ORTHO LTX SZ7.5 (GLOVE) ×4 IMPLANT
GOWN STRL REUS W/ TWL LRG LVL3 (GOWN DISPOSABLE) ×4 IMPLANT
GOWN STRL REUS W/ TWL XL LVL3 (GOWN DISPOSABLE) ×2 IMPLANT
GUIDEWIRE 3.2X400 (WIRE) IMPLANT
KIT BASIN OR (CUSTOM PROCEDURE TRAY) ×2 IMPLANT
KIT TURNOVER KIT B (KITS) ×2 IMPLANT
MANIFOLD NEPTUNE II (INSTRUMENTS) ×2 IMPLANT
NAIL CANN FRN TI 360 RT (Nail) IMPLANT
NS IRRIG 1000ML POUR BTL (IV SOLUTION) ×2 IMPLANT
PACK GENERAL/GYN (CUSTOM PROCEDURE TRAY) ×2 IMPLANT
PAD ARMBOARD 7.5X6 YLW CONV (MISCELLANEOUS) ×4 IMPLANT
PASSER CERCLAGE STRT MED DISP (ORTHOPEDIC DISPOSABLE SUPPLIES) IMPLANT
REAMER ROD DEEP FLUTE 2.5X950 (INSTRUMENTS) IMPLANT
SCREW HIP F/IM NL 6.5/L85/XL40 (Screw) IMPLANT
SCREW HIP IM NAIL 6.5X90 (Screw) IMPLANT
SCREW LOCK IM TI 5X40 (Screw) IMPLANT
STAPLER VISISTAT 35W (STAPLE) ×2 IMPLANT
SUT ETHILON 2 0 PSLX (SUTURE) ×2 IMPLANT
SUT TIGERTAPE CERCLAGE TGRLNK (SUTURE) ×1
SUT VIC AB 0 CT1 27XBRD ANBCTR (SUTURE) ×2 IMPLANT
SUT VIC AB 1 CT1 27XBRD ANBCTR (SUTURE) ×2 IMPLANT
SUT VIC AB 2-0 CT1 TAPERPNT 27 (SUTURE) ×2 IMPLANT
SUTURE TIGERTAPE CERCLG TGRLNK (SUTURE) IMPLANT
TOWEL GREEN STERILE (TOWEL DISPOSABLE) ×4 IMPLANT
TOWEL GREEN STERILE FF (TOWEL DISPOSABLE) ×2 IMPLANT
TUBE CONNECTING 12X1/4 (SUCTIONS) IMPLANT
WATER STERILE IRR 1000ML POUR (IV SOLUTION) ×2 IMPLANT
YANKAUER SUCT BULB TIP NO VENT (SUCTIONS) IMPLANT

## 2023-05-30 NOTE — TOC CAGE-AID Note (Signed)
Transition of Care [facility]) - CAGE-AID Screening   Patient Details  Name: [name] [name] MRN: [id number] Date of Birth: July 26, 1935  [facility] of [facility]) CM/SW Contact:    Leota Sauers, RN Phone Number: 05/30/2023, 9:05 PM   Clinical Narrative:  Patient denies the use of alcohol and illicit substances. Resources not given at this time.   CAGE-AID Screening:    Have You Ever Felt You Ought to Cut Down on Your Drinking or Drug Use?: No Have People Annoyed You By Critizing Your Drinking Or Drug Use?: No Have You Felt Bad Or Guilty About Your Drinking Or Drug Use?: No Have You Ever Had a Drink or Used Drugs First Thing In The Morning to Steady Your Nerves or to Get Rid of a Hangover?: No CAGE-AID Score: 0  Substance Abuse Education Offered: No

## 2023-05-30 NOTE — Anesthesia Preprocedure Evaluation (Signed)
Anesthesia Evaluation  Patient identified by MRN, date of birth, ID band Patient awake    Reviewed: Allergy & Precautions, NPO status , Patient's Chart, lab work & pertinent test results  History of Anesthesia Complications (+) PONV and history of anesthetic complications  Airway Mallampati: II  TM Distance: >3 FB Neck ROM: Full    Dental  (+) Dental Advisory Given   Pulmonary asthma    breath sounds clear to auscultation       Cardiovascular hypertension, Pt. on medications and Pt. on home beta blockers + CAD and + Peripheral Vascular Disease   Rhythm:Regular Rate:Normal     Neuro/Psych TIA Neuromuscular disease    GI/Hepatic Neg liver ROS, hiatal hernia,GERD  ,,  Endo/Other  negative endocrine ROS    Renal/GU CRFRenal disease     Musculoskeletal  (+) Arthritis ,    Abdominal   Peds  Hematology negative hematology ROS (+)   Anesthesia Other Findings   Reproductive/Obstetrics                             Anesthesia Physical Anesthesia Plan  ASA: 3  Anesthesia Plan: General   Post-op Pain Management: Ofirmev IV (intra-op)*   Induction: Intravenous and Rapid sequence  PONV Risk Score and Plan: 4 or greater and Propofol infusion, Dexamethasone, Ondansetron and Treatment may vary due to age or medical condition  Airway Management Planned: Oral ETT  Additional Equipment:   Intra-op Plan:   Post-operative Plan: Extubation in OR  Informed Consent: I have reviewed the patients History and Physical, chart, labs and discussed the procedure including the risks, benefits and alternatives for the proposed anesthesia with the patient or authorized representative who has indicated his/her understanding and acceptance.     Dental advisory given  Plan Discussed with: CRNA  Anesthesia Plan Comments:        Anesthesia Quick Evaluation

## 2023-05-30 NOTE — Transfer of Care (Signed)
Immediate Anesthesia Transfer of Care Note  Patient: [name] [name]  Procedure(s) Performed: INTRAMEDULLARY (IM) NAIL INTERTROCHANTERIC (Right)  Patient Location: PACU  Anesthesia Type:General  Level of Consciousness: awake, alert , and oriented  Airway & Oxygen Therapy: Patient Spontanous Breathing  Post-op Assessment: Report given to RN and Post -op Vital signs reviewed and stable  Post vital signs: Reviewed and stable  Last Vitals:  Vitals Value Taken Time  BP 185/75 05/30/23 1455  Temp    Pulse 85 05/30/23 1459  Resp 35 05/30/23 1500  SpO2 91 % 05/30/23 1459  Vitals shown include unfiled device data.  Last Pain:  Vitals:   05/30/23 0930  TempSrc: Oral  PainSc: 10-Worst pain ever         Complications: No notable events documented.

## 2023-05-30 NOTE — Progress Notes (Signed)
PROGRESS NOTE    DELEENA [name]  ZOX:[id number] DOB: Aug 30, 1935 DOA: 05/29/2023 PCP: Lucky Cowboy, MD   88 y.o. female with medical history significant for hypertension, depression, CKD stage III, hiatal hernia, CAD , she was evaluated in the ER for mechanical fall at home on 1/14, x-rays then were unremarkable, 1/20 morning, she got out of bed went to reach for her walker, missed it and fell to the floor, back in the ER, x-rays noted right hip fracture, orthopedics consulted -Transferred to [facility] for surgery  Subjective: -Has some pain in her right hip  Assessment and Plan:  Right hip fracture -X-ray noted displaced and foreshortened intertrochanteric fracture of the right proximal femur. -Orthopedic surgery consulting, anticipate need for surgery today -Pain control, add DVT prophylaxis tomorrow   Leukocytosis-likely reactive, improvingAnxiety-BuSpar as needed   Hypertension-BP slightly higher, continue propranolol and Norvasc for now   GERD-continue Protonix, Carafate   Peripheral neuropathy-Neurontin as needed   DVT prophylaxis: SCDs, add Lovenox after surgery     Code Status: Full Code  Family Communication: Spouse at bedside Disposition Plan: Anticipate need for short-term rehab  Consultants:    Procedures:   Antimicrobials:    Objective: Vitals:   05/29/23 2015 05/29/23 2149 05/30/23 0736 05/30/23 0746  BP: (!) 173/68  (!) 148/74   Pulse: 95 91 86   Resp: 20 20 17    Temp: 98.1 F (36.7 C)  98.6 F (37 C)   TempSrc:   Oral   SpO2: 98% 98% 99% 98%  Weight:      Height:       No intake or output data in the 24 hours ending 05/30/23 0930 Filed Weights   05/29/23 1832  Weight: 73.6 kg    Examination:  General exam: Appears calm and comfortable, AAOx2, mild cognitive deficits Respiratory system: Clear to auscultation Cardiovascular system: S1 & S2 heard, RRR.  Abd: nondistended, soft and nontender.Normal bowel sounds heard. Central nervous system:  Alert and oriented. No focal neurological deficits. Extremities: Right lower extremity externally rotated and shortened Skin: No rashes Psychiatry:  Mood & affect appropriate.     Data Reviewed:   CBC: Recent Labs  Lab 05/23/23 1944 05/29/23 0940 05/30/23 0519  WBC 20.2* 16.1* 11.8*  NEUTROABS  --  13.2*  --   HGB 13.7 12.9 12.6  HCT 41.4 40.7 38.3  MCV 96.3 96.9 97.2  PLT 364 272 272   Basic Metabolic Panel: Recent Labs  Lab 05/29/23 0940 05/30/23 0519  NA 138 137  K 3.9 4.1  CL 104 103  CO2 22 26  GLUCOSE 139* 129*  BUN 19 15  CREATININE 0.59 0.67  CALCIUM 9.2 8.8*   GFR: Estimated Creatinine Clearance: 47.2 mL/min (by C-G formula based on SCr of 0.67 mg/dL). Liver Function Tests: No results for input(s): "AST", "ALT", "ALKPHOS", "BILITOT", "PROT", "ALBUMIN" in the last 168 hours. No results for input(s): "LIPASE", "AMYLASE" in the last 168 hours. No results for input(s): "AMMONIA" in the last 168 hours. Coagulation Profile: Recent Labs  Lab 05/29/23 1000  INR 0.9   Cardiac Enzymes: No results for input(s): "CKTOTAL", "CKMB", "CKMBINDEX", "TROPONINI" in the last 168 hours. BNP (last 3 results) No results for input(s): "PROBNP" in the last 8760 hours. HbA1C: No results for input(s): "HGBA1C" in the last 72 hours. CBG: No results for input(s): "GLUCAP" in the last 168 hours. Lipid Profile: No results for input(s): "CHOL", "HDL", "LDLCALC", "TRIG", "CHOLHDL", "LDLDIRECT" in the last 72 hours. Thyroid Function Tests: No results  for input(s): "TSH", "T4TOTAL", "FREET4", "T3FREE", "THYROIDAB" in the last 72 hours. Anemia Panel: No results for input(s): "VITAMINB12", "FOLATE", "FERRITIN", "TIBC", "IRON", "RETICCTPCT" in the last 72 hours. Urine analysis:    Component Value Date/Time   COLORURINE YELLOW 10/31/2022 1055   APPEARANCEUR CLOUDY (A) 10/31/2022 1055   LABSPEC 1.021 10/31/2022 1055   PHURINE 6.0 10/31/2022 1055   GLUCOSEU NEGATIVE 10/31/2022  1055   HGBUR NEGATIVE 10/31/2022 1055   BILIRUBINUR NEGATIVE 12/20/2019 2052   KETONESUR TRACE (A) 10/31/2022 1055   PROTEINUR TRACE (A) 10/31/2022 1055   UROBILINOGEN 0.2 09/19/2014 2235   NITRITE NEGATIVE 10/31/2022 1055   LEUKOCYTESUR NEGATIVE 10/31/2022 1055   Sepsis Labs: @LABRCNTIP (procalcitonin:4,lacticidven:4)  )No results found for this or any previous visit (from the past 240 hours).   Radiology Studies: DG Hip Unilat W or Wo Pelvis 2-3 Views Right Result Date: 05/29/2023 CLINICAL DATA:  Right hip deformity status post fall. EXAM: DG HIP (WITH OR WITHOUT PELVIS) 2-3V RIGHT; RIGHT FEMUR 2 VIEWS COMPARISON:  Right hip radiographs dated May 23, 2023. CT abdomen/pelvis dated August 05, 2022. FINDINGS: Acute impacted and displaced intertrochanteric fracture of the proximal right femur. There is approximately 9 mm of lateral displacement of the distal segment. The right femoral head is seated within the acetabulum. Remote left superior and inferior pubic rami fractures. Remote right inferior pubic ramus fracture. Postoperative changes of the visualized lumbar spine with prior vertebral augmentation. The sacroiliac joints and pubic symphysis appear anatomically aligned. The left hip joint is anatomically aligned. The knee joint is anatomically aligned with chondrocalcinosis in the medial and lateral femorotibial compartments. IMPRESSION: Acute displaced and foreshortened intertrochanteric fracture of the proximal right femur. Electronically Signed   By: Hart Robinsons M.D.   On: 05/29/2023 14:06   DG FEMUR, MIN 2 VIEWS RIGHT Result Date: 05/29/2023 CLINICAL DATA:  Right hip deformity status post fall. EXAM: DG HIP (WITH OR WITHOUT PELVIS) 2-3V RIGHT; RIGHT FEMUR 2 VIEWS COMPARISON:  Right hip radiographs dated May 23, 2023. CT abdomen/pelvis dated August 05, 2022. FINDINGS: Acute impacted and displaced intertrochanteric fracture of the proximal right femur. There is approximately 9 mm  of lateral displacement of the distal segment. The right femoral head is seated within the acetabulum. Remote left superior and inferior pubic rami fractures. Remote right inferior pubic ramus fracture. Postoperative changes of the visualized lumbar spine with prior vertebral augmentation. The sacroiliac joints and pubic symphysis appear anatomically aligned. The left hip joint is anatomically aligned. The knee joint is anatomically aligned with chondrocalcinosis in the medial and lateral femorotibial compartments. IMPRESSION: Acute displaced and foreshortened intertrochanteric fracture of the proximal right femur. Electronically Signed   By: Hart Robinsons M.D.   On: 05/29/2023 14:06     Scheduled Meds:  [MAR Hold] amLODipine  10 mg Oral Daily   [MAR Hold] aspirin EC  81 mg Oral Q48H   chlorhexidine  15 mL Mouth/Throat Once   Or   mouth rinse  15 mL Mouth Rinse Once   chlorhexidine       [MAR Hold] fesoterodine  4 mg Oral Daily   [MAR Hold] mometasone-formoterol  2 puff Inhalation BID   [MAR Hold] mupirocin ointment  1 Application Nasal BID   [MAR Hold] pantoprazole  40 mg Oral Daily   [MAR Hold] propranolol  20 mg Oral BID   [MAR Hold] rOPINIRole  1 mg Oral QHS   [MAR Hold] senna-docusate  2 tablet Oral BID   [MAR Hold] sucralfate  1 g Oral  TID WC & HS   Continuous Infusions:   ceFAZolin (ANCEF) IV     lactated ringers     tranexamic acid       LOS: 1 day    Time spent:    Zannie Cove, MD [facility]   05/30/2023, 9:30 AM

## 2023-05-30 NOTE — Consult Note (Signed)
Reason for Consult:Right hip fx Referring Physician: Netta Cedars Time called: 0730 Time at bedside: 0907   [name] [name] is an 88 y.o. female.  HPI: [name] [name] was trying to get her RW and misjudged the distance and fell. She had immediate right hip pain and could not get up. She was brought to the ED where x-rays showed a right hip fx and orthopedic surgery was consulted. Due to clearance issues she was not ready for surgery on presentation and orthopedic trauma consultation was requested to insure fixation in a timely manner. She lives at home with her husband.  Past Medical History:  Diagnosis Date   Anxiety    takes Xanax daily as needed   Asthma    Albuterol daily as needed   Blood transfusion without reported diagnosis    CAD (coronary artery disease)    LHC 2003 with 40% pLAD, 30% mLAD, 100% D1 (moderate vessel), 100%  D2 (small vessel), 95% mid small OM2.  Pt had PTCA to D1;  D2 and OM2 small vessels and tx medically;  Last Myoview (2012): anterior infarct seen with scar, no ischemia. EF normal. Med Rx // Myoview (12/14):  Low risk; ant scar w/ peri-infarct ischemia; EF 55% with ant AK // Myoview 5/16: EF 55-65, ant, apical scar, no ischemia    Cataract    Chronic back pain    HNP, DDD, spinal stenosis, vertebral compression fractures.    Chronic nausea    takes Zofran daily as needed.  normal gastric emptying study 03/2013   CKD (chronic kidney disease), stage III (HCC) 09/27/2017   Constipation    felt to be functional. takes Senokot and Miralax daily.  treated with Linzess in past.    DIVERTICULOSIS, COLON 08/28/2008   Qualifier: Diagnosis of  By: Bascom Levels, [name], [name]     Elevated LFTs 2015   probably med induced DILI, from Augmentin vs Pravastatin   GERD (gastroesophageal reflux disease)    hx of esophageal stricture    Hiatal hernia    Paraesophageal hernias. s/p fundoplications in 2013 and 04/2013   History of bronchitis several of yrs ago   History of colon polyps  03/2009, 03/2011   adenomatous colon polyps, no high grade dysplasia.    History of vertigo    no meds   Hyperlipidemia    takes Pravastatin daily   Hypertension    takes Amlodipine,Metoprolol,and Losartan daily   Multiple closed pelvic fractures without disruption of pelvic circle (HCC)    Nausea 03/2016   Osteoporosis    PONV (postoperative nausea and vomiting)    yrs ago   Pubic ramus fracture (HCC) 12/20/2019   Slow urinary stream    takes Rapaflo daily   TIA (transient ischemic attack) 1995   no residual effects noted   Vertebral compression fracture (HCC) 06/23/2012    Past Surgical History:  Procedure Laterality Date   APPENDECTOMY     patient unsure of date   BACK SURGERY     BLADDER REPAIR     CHOLECYSTECTOMY     Patient unsure of date.   CORONARY ANGIOPLASTY  11/16/2001   1 stent   ESOPHAGEAL MANOMETRY N/A 03/25/2013   Procedure: ESOPHAGEAL MANOMETRY (EM);  Surgeon: Louis Meckel, MD;  Location: [facility];  Service: Endoscopy;  Laterality: N/A;   ESOPHAGOGASTRODUODENOSCOPY N/A 03/15/2013   Procedure: ESOPHAGOGASTRODUODENOSCOPY (EGD);  Surgeon: Beverley Fiedler, MD;  Location: [facility] ENDOSCOPY;  Service: Endoscopy;  Laterality: N/A;   ESOPHAGOGASTRODUODENOSCOPY N/A 12/25/2015   Procedure: ESOPHAGOGASTRODUODENOSCOPY (  EGD);  Surgeon: Sherrilyn Rist, MD;  Location[facility] ENDOSCOPY;  Service: Endoscopy;  Laterality: N/A;   EYE SURGERY  11/2000   bilateral cataracts with lens implant   FIXATION KYPHOPLASTY LUMBAR SPINE  X 2   "L3-4"   HEMORRHOID SURGERY     HIATAL HERNIA REPAIR  06/03/2011   Procedure: LAPAROSCOPIC REPAIR OF HIATAL HERNIA;  Surgeon: Ernestene Mention, MD;  Location: [facility] ORS;  Service: General;  Laterality: N/A;   HIATAL HERNIA REPAIR N/A 04/24/2013   Procedure: LAPAROSCOPIC  REPAIR RECURRENT PARASOPHAGEAL HIATAL HERNIA WITH FUNDOPLICATION;  Surgeon: Ardeth Sportsman, MD;  Location: [facility] ORS;  Service: General;  Laterality: N/A;   INSERTION OF MESH N/A 04/24/2013    Procedure: INSERTION OF MESH;  Surgeon: Ardeth Sportsman, MD;  Location: [facility] ORS;  Service: General;  Laterality: N/A;   IR KYPHO THORACIC WITH BONE BIOPSY  09/29/2017   IR KYPHO THORACIC WITH BONE BIOPSY  11/03/2017   IR KYPHO THORACIC WITH BONE BIOPSY  02/14/2019   IR KYPHO THORACIC WITH BONE BIOPSY  04/02/2019   LAPAROSCOPIC LYSIS OF ADHESIONS N/A 04/24/2013   Procedure: LAPAROSCOPIC LYSIS OF ADHESIONS;  Surgeon: Ardeth Sportsman, MD;  Location: [facility];  Service: General;  Laterality: N/A;   LAPAROSCOPIC NISSEN FUNDOPLICATION  06/03/2011   Procedure: LAPAROSCOPIC NISSEN FUNDOPLICATION;  Surgeon: Ernestene Mention, MD;  Location: [facility] ORS;  Service: General;  Laterality: N/A;   LUMBAR FUSION  12/07/2015   Right L2-3 facetectomy, posterolateral fusion L2-3 with pedicle screws, rods, local bone graft, Vivigen cancellous chips. Fusion extended to the L1 level with pedicle screws and rods   LUMBAR LAMINECTOMY/DECOMPRESSION MICRODISCECTOMY Right 06/26/2012   Procedure: LUMBAR LAMINECTOMY/DECOMPRESSION MICRODISCECTOMY;  Surgeon: Kerrin Champagne, MD;  Location: [facility] OR;  Service: Orthopedics;  Laterality: Right;  RIGHT L4-5 MICRODISCECTOMY   LUMBAR LAMINECTOMY/DECOMPRESSION MICRODISCECTOMY N/A 05/29/2015   Procedure: RIGHT L2-3 MICRODISCECTOMY;  Surgeon: Kerrin Champagne, MD;  Location: [facility] OR;  Service: Orthopedics;  Laterality: N/A;   TOTAL ABDOMINAL HYSTERECTOMY  1975   partial   UPPER GASTROINTESTINAL ENDOSCOPY      Family History  Problem Relation Age of Onset   Colon cancer Mother 35   Heart attack Father 39       heart attack   Brain cancer Brother        tumor    Heart disease Brother    Ulcers Brother    Arthritis Brother    Kidney disease Daughter    Esophageal cancer Neg Hx    Stomach cancer Neg Hx     Social History:  reports that she has never smoked. She has never used smokeless tobacco. She reports that she does not drink alcohol and does not use drugs.  Allergies:  Allergies   Allergen Reactions   Ace Inhibitors Cough    Per Dr Sherene Sires pulmonology 2013   Aspartame Diarrhea and Nausea And Vomiting   Atorvastatin Other (See Comments)    Muscle aches   Biaxin [Clarithromycin] Other (See Comments)    Unknown reaction : PATIENT CAN TOLERATE Z-PAK.  Is written on patient's paper chart.   Daliresp [Roflumilast] Nausea And Vomiting   Erythromycin Other (See Comments)    Unknown reaction: PATIENT CAN TOLERATE Z-PAK.  It is written on patient's paper chart.   Levofloxacin Nausea And Vomiting    Medications: I have reviewed the patient's current medications.  Results for orders placed or performed during the hospital encounter of 05/29/23 (from the past 48 hours)  Basic metabolic panel     Status: Abnormal   Collection Time: 05/29/23  9:40 AM  Result Value Ref Range   Sodium 138 135 - 145 mmol/L   Potassium 3.9 3.5 - 5.1 mmol/L   Chloride 104 98 - 111 mmol/L   CO2 22 22 - 32 mmol/L   Glucose, Bld 139 (H) 70 - 99 mg/dL    Comment: Glucose reference range applies only to samples taken after fasting for at least 8 hours.   BUN 19 8 - 23 mg/dL   Creatinine, Ser 8.65 0.44 - 1.00 mg/dL   Calcium 9.2 8.9 - 78.4 mg/dL   GFR, Estimated >69 >62 mL/min    Comment: (NOTE) Calculated using the CKD-EPI Creatinine Equation (2021)    Anion gap 12 5 - 15    Comment: Performed at [facility], [address] [place], [place] [address]  CBC with Differential     Status: Abnormal   Collection Time: 05/29/23  9:40 AM  Result Value Ref Range   WBC 16.1 (H) 4.0 - 10.5 K/uL   RBC 4.20 3.87 - 5.11 MIL/uL   Hemoglobin 12.9 12.0 - 15.0 g/dL   HCT 13.2 44.0 - 10.2 %   MCV 96.9 80.0 - 100.0 fL   MCH 30.7 26.0 - 34.0 pg   MCHC 31.7 30.0 - 36.0 g/dL   RDW 72.5 36.6 - 44.0 %   Platelets 272 150 - 400 K/uL   nRBC 0.0 0.0 - 0.2 %   Neutrophils Relative % 81 %   Neutro Abs 13.2 (H) 1.7 - 7.7 K/uL   Lymphocytes Relative 7 %   Lymphs Abs 1.1 0.7 - 4.0 K/uL    Monocytes Relative 9 %   Monocytes Absolute 1.4 (H) 0.1 - 1.0 K/uL   Eosinophils Relative 1 %   Eosinophils Absolute 0.2 0.0 - 0.5 K/uL   Basophils Relative 1 %   Basophils Absolute 0.1 0.0 - 0.1 K/uL   Immature Granulocytes 1 %   Abs Immature Granulocytes 0.11 (H) 0.00 - 0.07 K/uL    Comment: Performed at [facility], [address], [place], [place] [address]  Type and screen [facility]     Status: None   Collection Time: 05/29/23 10:00 AM  Result Value Ref Range   ABO/RH(D) O POS    Antibody Screen NEG    Sample Expiration      06/01/2023,2359 Performed at [facility], [address] [place], [place] [address]   Protime-INR     Status: None   Collection Time: 05/29/23 10:00 AM  Result Value Ref Range   Prothrombin Time 12.6 11.4 - 15.2 seconds   INR 0.9 0.8 - 1.2    Comment: (NOTE) INR goal varies based on device and disease states. Performed at [facility], [address], [place], [place] [address]   Basic metabolic panel     Status: Abnormal   Collection Time: 05/30/23  5:19 AM  Result Value Ref Range   Sodium 137 135 - 145 mmol/L   Potassium 4.1 3.5 - 5.1 mmol/L   Chloride 103 98 - 111 mmol/L   CO2 26 22 - 32 mmol/L   Glucose, Bld 129 (H) 70 - 99 mg/dL    Comment: Glucose reference range applies only to samples taken after fasting for at least 8 hours.   BUN 15 8 - 23 mg/dL   Creatinine, Ser 3.32 0.44 - 1.00 mg/dL   Calcium  8.8 (L) 8.9 - 10.3 mg/dL   GFR, Estimated >16 >10 mL/min    Comment: (NOTE) Calculated using the CKD-EPI Creatinine Equation (2021)    Anion gap 8 5 - 15    Comment: Performed at [facility], [address], [place], [place] [address]  CBC     Status: Abnormal   Collection Time: 05/30/23  5:19 AM  Result Value Ref Range   WBC 11.8 (H) 4.0 - 10.5 K/uL   RBC 3.94 3.87 - 5.11 MIL/uL   Hemoglobin 12.6 12.0 - 15.0 g/dL   HCT 40.9 81.1 - 91.4 %   MCV 97.2  80.0 - 100.0 fL   MCH 32.0 26.0 - 34.0 pg   MCHC 32.9 30.0 - 36.0 g/dL   RDW 78.2 95.6 - 21.3 %   Platelets 272 150 - 400 K/uL   nRBC 0.0 0.0 - 0.2 %    Comment: Performed at [facility], [address] [place], [place] [address]   *Note: Due to a large number of results and/or encounters for the requested time period, some results have not been displayed. A complete set of results can be found in Results Review.    DG Hip Unilat W or Wo Pelvis 2-3 Views Right Result Date: 05/29/2023 CLINICAL DATA:  Right hip deformity status post fall. EXAM: DG HIP (WITH OR WITHOUT PELVIS) 2-3V RIGHT; RIGHT FEMUR 2 VIEWS COMPARISON:  Right hip radiographs dated May 23, 2023. CT abdomen/pelvis dated August 05, 2022. FINDINGS: Acute impacted and displaced intertrochanteric fracture of the proximal right femur. There is approximately 9 mm of lateral displacement of the distal segment. The right femoral head is seated within the acetabulum. Remote left superior and inferior pubic rami fractures. Remote right inferior pubic ramus fracture. Postoperative changes of the visualized lumbar spine with prior vertebral augmentation. The sacroiliac joints and pubic symphysis appear anatomically aligned. The left hip joint is anatomically aligned. The knee joint is anatomically aligned with chondrocalcinosis in the medial and lateral femorotibial compartments. IMPRESSION: Acute displaced and foreshortened intertrochanteric fracture of the proximal right femur. Electronically Signed   By: Hart Robinsons M.D.   On: 05/29/2023 14:06   DG FEMUR, MIN 2 VIEWS RIGHT Result Date: 05/29/2023 CLINICAL DATA:  Right hip deformity status post fall. EXAM: DG HIP (WITH OR WITHOUT PELVIS) 2-3V RIGHT; RIGHT FEMUR 2 VIEWS COMPARISON:  Right hip radiographs dated May 23, 2023. CT abdomen/pelvis dated August 05, 2022. FINDINGS: Acute impacted and displaced intertrochanteric fracture of the proximal right femur. There is approximately  9 mm of lateral displacement of the distal segment. The right femoral head is seated within the acetabulum. Remote left superior and inferior pubic rami fractures. Remote right inferior pubic ramus fracture. Postoperative changes of the visualized lumbar spine with prior vertebral augmentation. The sacroiliac joints and pubic symphysis appear anatomically aligned. The left hip joint is anatomically aligned. The knee joint is anatomically aligned with chondrocalcinosis in the medial and lateral femorotibial compartments. IMPRESSION: Acute displaced and foreshortened intertrochanteric fracture of the proximal right femur. Electronically Signed   By: Hart Robinsons M.D.   On: 05/29/2023 14:06    Review of Systems  HENT:  Negative for ear discharge, ear pain, hearing loss and tinnitus.   Eyes:  Negative for photophobia and pain.  Respiratory:  Negative for cough and shortness of breath.   Cardiovascular:  Negative for chest pain.  Gastrointestinal:  Negative for abdominal pain, nausea and vomiting.  Genitourinary:  Negative for dysuria, flank  pain, frequency and urgency.  Musculoskeletal:  Positive for arthralgias (Right hip). Negative for back pain, myalgias and neck pain.  Neurological:  Negative for dizziness and headaches.  Hematological:  Does not bruise/bleed easily.  Psychiatric/Behavioral:  The patient is not nervous/anxious.    Blood pressure (!) 148/74, pulse 86, temperature 98.6 F (37 C), temperature source Oral, resp. rate 17, height 5\' 1"  (1.549 m), weight 73.6 kg, SpO2 98%. Physical Exam Constitutional:      General: She is not in acute distress.    Appearance: She is well-developed. She is not diaphoretic.  HENT:     Head: Normocephalic and atraumatic.  Eyes:     General: No scleral icterus.       Right eye: No discharge.        Left eye: No discharge.     Conjunctiva/sclera: Conjunctivae normal.  Cardiovascular:     Rate and Rhythm: Normal rate and regular rhythm.   Pulmonary:     Effort: Pulmonary effort is normal. No respiratory distress.  Musculoskeletal:     Cervical back: Normal range of motion.     Comments: RLE No traumatic wounds, ecchymosis, or rash  Mild TTP hip  No knee or ankle effusion  Knee stable to varus/ valgus and anterior/posterior stress  Sens DPN, SPN, TN intact  Motor EHL, ext, flex, evers 5/5  DP 2+, PT 2+, No significant edema  Skin:    General: Skin is warm and dry.  Neurological:     Mental Status: She is alert.  Psychiatric:        Mood and Affect: Mood normal.        Behavior: Behavior normal.     Assessment/Plan: Right hip fx -- Plan IMN today with Dr. Carola Frost. Please keep NPO. Multiple medical problems including hypertension, depression, CKD stage III, hiatal hernia, and CAD -- per primary service    Freeman Caldron, PA-C Orthopedic Surgery [phone] 05/30/2023, 9:18 AM

## 2023-05-30 NOTE — Anesthesia Procedure Notes (Signed)
Procedure Name: Intubation Date/Time: 05/30/2023 12:41 PM  Performed by: Shary Decamp, CRNAPre-anesthesia Checklist: Patient identified, Patient being monitored, Timeout performed, Emergency Drugs available and Suction available Patient Re-evaluated:Patient Re-evaluated prior to induction Oxygen Delivery Method: Circle System Utilized Preoxygenation: Pre-oxygenation with 100% oxygen Induction Type: IV induction Ventilation: Mask ventilation without difficulty Laryngoscope Size: Miller and 2 Grade View: Grade I Tube type: Oral Tube size: 7.0 mm Number of attempts: 1 Airway Equipment and Method: Stylet Placement Confirmation: ETT inserted through vocal cords under direct vision, positive ETCO2 and breath sounds checked- equal and bilateral Secured at: 21 cm Tube secured with: Tape Dental Injury: Teeth and Oropharynx as per pre-operative assessment

## 2023-05-31 DIAGNOSIS — D72829 Elevated white blood cell count, unspecified: Secondary | ICD-10-CM | POA: Insufficient documentation

## 2023-05-31 DIAGNOSIS — S72001D Fracture of unspecified part of neck of right femur, subsequent encounter for closed fracture with routine healing: Secondary | ICD-10-CM | POA: Diagnosis not present

## 2023-05-31 LAB — CBC
HCT: 36.4 % (ref 36.0–46.0)
Hemoglobin: 12 g/dL (ref 12.0–15.0)
MCH: 31.7 pg (ref 26.0–34.0)
MCHC: 33 g/dL (ref 30.0–36.0)
MCV: 96.3 fL (ref 80.0–100.0)
Platelets: 314 10*3/uL (ref 150–400)
RBC: 3.78 MIL/uL — ABNORMAL LOW (ref 3.87–5.11)
RDW: 13.5 % (ref 11.5–15.5)
WBC: 15.1 10*3/uL — ABNORMAL HIGH (ref 4.0–10.5)
nRBC: 0 % (ref 0.0–0.2)

## 2023-05-31 LAB — BASIC METABOLIC PANEL
Anion gap: 11 (ref 5–15)
BUN: 16 mg/dL (ref 8–23)
CO2: 26 mmol/L (ref 22–32)
Calcium: 9.3 mg/dL (ref 8.9–10.3)
Chloride: 96 mmol/L — ABNORMAL LOW (ref 98–111)
Creatinine, Ser: 0.77 mg/dL (ref 0.44–1.00)
GFR, Estimated: >60 mL/min (ref >60)
Glucose, Bld: 149 mg/dL — ABNORMAL HIGH (ref 70–99)
Potassium: 4.4 mmol/L (ref 3.5–5.1)
Sodium: 133 mmol/L — ABNORMAL LOW (ref 135–145)

## 2023-05-31 LAB — VITAMIN D 25 HYDROXY (VIT D DEFICIENCY, FRACTURES): Vit D, 25-Hydroxy: 63.89 ng/mL (ref 30–100)

## 2023-05-31 MED ORDER — ENSURE ENLIVE PO LIQD
237.0000 mL | Freq: Two times a day (BID) | ORAL | Status: DC
Start: 2023-06-01 — End: 2023-06-04
  Administered 2023-06-01 – 2023-06-03 (×5): 237 mL via ORAL

## 2023-05-31 NOTE — Evaluation (Signed)
Occupational Therapy Evaluation Patient Details Name: [name] [name] MRN: [id number] DOB: 1935/10/07 Today's Date: 05/31/2023   History of Present Illness Pt is a 88 y/o female presenting on 1/20 after fall. Found with R hip fx, 1/21 S/P IMN, WBAT with walker. PMH includes: anxiety, CAD, chronic back pain, CKD, hiatal hernia, HTN, prior back surgery.   Clinical Impression   PTA patient reports independent with ADLs, light IADLs and mobility using cane. Admitted for above and presents with problem list below.  She is internally distracted by pain and nausea during session, but able to follow simple commands with increased time.  She requires min guard to max assist +2 for ADLs, mod assist +2 for bed mobility and transfers.  Based on performance today, believe patient will best benefit from continued OT services acutely and after dc at inpatient setting with <3hrs/day to optimize independence, safety with ADLs and mobility.        If plan is discharge home, recommend the following: Two people to help with walking and/or transfers;Two people to help with bathing/dressing/bathroom    Functional Status Assessment  Patient has had a recent decline in their functional status and demonstrates the ability to make significant improvements in function in a reasonable and predictable amount of time.  Equipment Recommendations  Other (comment) (defer)    Recommendations for Other Services       Precautions / Restrictions Precautions Precautions: Fall Restrictions Weight Bearing Restrictions Per Provider Order: Yes RLE Weight Bearing Per Provider Order: Weight bearing as tolerated      Mobility Bed Mobility Overal bed mobility: Needs Assistance Bed Mobility: Supine to Sit     Supine to sit: Mod assist, +2 for physical assistance     General bed mobility comments: assist for R LE, trunk and scooting forward    Transfers Overall transfer level: Needs assistance Equipment used: Rolling  walker (2 wheels) Transfers: Sit to/from Stand, Bed to chair/wheelchair/BSC Sit to Stand: Mod assist, +2 physical assistance Stand pivot transfers: Mod assist, +2 physical assistance         General transfer comment: to recliner towards R side, cueing for technique      Balance Overall balance assessment: Needs assistance Sitting-balance support: No upper extremity supported, Feet supported Sitting balance-Leahy Scale: Fair Sitting balance - Comments: limited dynamically   Standing balance support: Bilateral upper extremity supported, During functional activity Standing balance-Leahy Scale: Poor Standing balance comment: relies on BUE and external support                           ADL either performed or assessed with clinical judgement   ADL Overall ADL's : Needs assistance/impaired     Grooming: Set up;Sitting           Upper Body Dressing : Contact guard assist;Sitting   Lower Body Dressing: Maximal assistance;+2 for physical assistance;Sit to/from stand   Toilet Transfer: Moderate assistance;+2 for physical assistance;Rolling walker (2 wheels) Toilet Transfer Details (indicate cue type and reason): simulated to recliner, stand pivot         Functional mobility during ADLs: Moderate assistance;+2 for physical assistance;Rolling walker (2 wheels);Cueing for sequencing;Cueing for safety       Vision   Vision Assessment?: No apparent visual deficits     Perception         Praxis         Pertinent Vitals/Pain Pain Assessment Pain Assessment: 0-10 Pain Score: 8  Pain Location: R hip  Pain Descriptors / Indicators: Discomfort, Operative site guarding Pain Intervention(s): Monitored during session, Limited activity within patient's tolerance, RN gave pain meds during session, Repositioned     Extremity/Trunk Assessment Upper Extremity Assessment Upper Extremity Assessment: Generalized weakness   Lower Extremity Assessment Lower Extremity  Assessment: Defer to PT evaluation       Communication Communication Communication: No apparent difficulties   Cognition Arousal: Alert Behavior During Therapy: Anxious Overall Cognitive Status: Within Functional Limits for tasks assessed                                 General Comments: pt internally distracted by pain and nausea, but overall appears WFL.  Some slow processing but not formally assessed     General Comments  spouse at side and supportive, dressing intact to R LE.  RN provided pain and nausea medication during session.    Exercises     Shoulder Instructions      Home Living Family/patient expects to be discharged to:: Private residence Living Arrangements: Spouse/significant other Available Help at Discharge: Family;Available 24 hours/day Type of Home: House Home Access: Stairs to enter [employer] of Steps: 2 Entrance Stairs-Rails: None Home Layout: One level     Bathroom Shower/Tub: Producer, television/film/video: Handicapped height     Home Equipment: Shower seat;Cane - single Librarian, academic (2 wheels);Rollator (4 wheels);Grab bars - tub/shower;Grab bars - toilet          Prior Functioning/Environment Prior Level of Function : Independent/Modified Independent;Driving;History of Falls (last six months)             Mobility Comments: Cane for community ADLs Comments: Independent with ADL's, iADL's        OT Problem List: Decreased strength;Decreased activity tolerance;Impaired balance (sitting and/or standing);Pain;Decreased safety awareness;Decreased knowledge of use of DME or AE;Decreased knowledge of precautions      OT Treatment/Interventions: Self-care/ADL training;Therapeutic exercise;DME and/or AE instruction;Therapeutic activities;Patient/family education;Balance training    OT Goals(Current goals can be found in the care plan section) Acute Rehab OT Goals Patient Stated Goal: get better OT Goal  Formulation: With patient Time For Goal Achievement: 06/14/23 Potential to Achieve Goals: Good  OT Frequency: Min 1X/week    Co-evaluation PT/OT/SLP Co-Evaluation/Treatment: Yes Reason for Co-Treatment: For patient/therapist safety;To address functional/ADL transfers PT goals addressed during session: Mobility/safety with mobility OT goals addressed during session: ADL's and self-care      AM-PAC OT "6 Clicks" Daily Activity     Outcome Measure Help from another person eating meals?: None Help from another person taking care of personal grooming?: A Little Help from another person toileting, which includes using toliet, bedpan, or urinal?: A Lot Help from another person bathing (including washing, rinsing, drying)?: A Lot Help from another person to put on and taking off regular upper body clothing?: A Little Help from another person to put on and taking off regular lower body clothing?: A Lot 6 Click Score: 16   End of Session Equipment Utilized During Treatment: Gait belt Nurse Communication: Mobility status  Activity Tolerance: Patient tolerated treatment well Patient left: in chair;with call bell/phone within reach;with chair alarm set;with family/visitor present  OT Visit Diagnosis: Other abnormalities of gait and mobility (R26.89);Muscle weakness (generalized) (M62.81);Pain Pain - Right/Left: Right Pain - part of body: Leg                Time: 1207-1230 OT Time Calculation (min): 23 min  Charges:  OT General Charges $OT Visit: 1 Visit OT Evaluation $OT Eval Moderate Complexity: 1 Mod  Barry Brunner, OT Acute Rehabilitation Services Office [phone]   [name] [name] 05/31/2023, 2:11 PM

## 2023-05-31 NOTE — Evaluation (Signed)
Physical Therapy Evaluation  Patient Details Name: [name] [name] MRN: [id number] DOB: [birth year] Today's Date: 05/31/2023  History of Present Illness  Pt is a 88 y/o female presenting on 1/20 after fall. Found with R hip fx, 1/21 S/P IMN, WBAT with walker. PMH includes: anxiety, CAD, chronic back pain, CKD, hiatal hernia, HTN, prior back surgery.  Clinical Impression  Pt admitted with above diagnosis. Pt currently with functional limitations due to the deficits listed below (see PT Problem List). At the time of PT eval pt was able to perform transfers with up to +2 assist. Pt was not able to progress to gait training at this time due to pain, and then nausea from the pain meds that were given. Anticipate pt will require post-acute rehab <3 hours/day to maximize functional independence, safety and return to PLOF. Acutely, pt will benefit from acute skilled PT to increase their independence and safety with mobility to allow discharge.           If plan is discharge home, recommend the following: Two people to help with walking and/or transfers;Two people to help with bathing/dressing/bathroom;Assistance with cooking/housework;Assist for transportation;Help with stairs or ramp for entrance   Can travel by private vehicle   No    Equipment Recommendations None recommended by PT  Recommendations for Other Services       Functional Status Assessment Patient has had a recent decline in their functional status and demonstrates the ability to make significant improvements in function in a reasonable and predictable amount of time.     Precautions / Restrictions Precautions Precautions: Fall Restrictions Weight Bearing Restrictions Per Provider Order: Yes RLE Weight Bearing Per Provider Order: Weight bearing as tolerated      Mobility  Bed Mobility Overal bed mobility: Needs Assistance Bed Mobility: Supine to Sit     Supine to sit: Mod assist, +2 for physical assistance     General  bed mobility comments: assist for R LE, trunk and scooting forward. Increased time and bed pad utilized for transition to full sitting.    Transfers Overall transfer level: Needs assistance Equipment used: Rolling walker (2 wheels) Transfers: Sit to/from Stand, Bed to chair/wheelchair/BSC Sit to Stand: Mod assist, +2 physical assistance Stand pivot transfers: Mod assist, +2 physical assistance         General transfer comment: to recliner towards L side, cueing for technique    Ambulation/Gait                  Stairs            Wheelchair Mobility     Tilt Bed    Modified Rankin (Stroke Patients Only)       Balance Overall balance assessment: Needs assistance Sitting-balance support: No upper extremity supported, Feet supported Sitting balance-Leahy Scale: Fair Sitting balance - Comments: limited dynamically   Standing balance support: Bilateral upper extremity supported, During functional activity Standing balance-Leahy Scale: Poor Standing balance comment: relies on BUE and external support                             Pertinent Vitals/Pain Pain Assessment Pain Assessment: 0-10 Pain Score: 8  Pain Location: R hip Pain Descriptors / Indicators: Discomfort, Operative site guarding Pain Intervention(s): Limited activity within patient's tolerance, Monitored during session, Repositioned    Home Living Family/patient expects to be discharged to:: Private residence Living Arrangements: Spouse/significant other Available Help at Discharge: Family;Available 24 hours/day Type of  Home: House Home Access: Stairs to enter Entrance Stairs-Rails: None Entrance Stairs-Number of Steps: 2   Home Layout: One level Home Equipment: Shower seat;Cane - single Librarian, academic (2 wheels);Rollator (4 wheels);Grab bars - tub/shower;Grab bars - toilet      Prior Function Prior Level of Function : Independent/Modified Independent;Driving;History of  Falls (last six months)             Mobility Comments: Cane for community ADLs Comments: Independent with ADL's, iADL's     Extremity/Trunk Assessment   Upper Extremity Assessment Upper Extremity Assessment: Defer to OT evaluation    Lower Extremity Assessment Lower Extremity Assessment: Generalized weakness;RLE deficits/detail RLE Deficits / Details: Acute pain, decreased strength and AROM consistent with above mentioned surgery. RLE: Unable to fully assess due to pain    Cervical / Trunk Assessment Cervical / Trunk Assessment: Other exceptions Cervical / Trunk Exceptions: Forward head posture with rounded shoulders  Communication   Communication Communication: No apparent difficulties Cueing Techniques: Verbal cues;Gestural cues  Cognition Arousal: Alert Behavior During Therapy: Anxious Overall Cognitive Status: Within Functional Limits for tasks assessed                                 General Comments: pt internally distracted by pain and nausea, but overall appears WFL.  Some slow processing but not formally assessed        General Comments General comments (skin integrity, edema, etc.): spouse at side and supportive, dressing intact to R LE.  RN provided pain and nausea medication during session.    Exercises     Assessment/Plan    PT Assessment Patient needs continued PT services  PT Problem List Decreased strength;Decreased activity tolerance;Decreased range of motion;Decreased balance;Decreased mobility;Decreased knowledge of use of DME;Decreased safety awareness;Decreased knowledge of precautions;Decreased skin integrity;Pain       PT Treatment Interventions DME instruction;Gait training;Stair training;Functional mobility training;Therapeutic activities;Therapeutic exercise;Balance training;Patient/family education    PT Goals (Current goals can be found in the Care Plan section)  Acute Rehab PT Goals Patient Stated Goal: Home at d/c PT  Goal Formulation: With patient/family Time For Goal Achievement: 06/13/23 Potential to Achieve Goals: Good    Frequency Min 1X/week     Co-evaluation PT/OT/SLP Co-Evaluation/Treatment: Yes Reason for Co-Treatment: For patient/therapist safety;To address functional/ADL transfers PT goals addressed during session: Mobility/safety with mobility;Balance;Strengthening/ROM OT goals addressed during session: ADL's and self-care       AM-PAC PT "6 Clicks" Mobility  Outcome Measure Help needed turning from your back to your side while in a flat bed without using bedrails?: Total Help needed moving from lying on your back to sitting on the side of a flat bed without using bedrails?: Total Help needed moving to and from a bed to a chair (including a wheelchair)?: Total Help needed standing up from a chair using your arms (e.g., wheelchair or bedside chair)?: Total Help needed to walk in hospital room?: Total Help needed climbing 3-5 steps with a railing? : Total 6 Click Score: 6    End of Session Equipment Utilized During Treatment: Gait belt Activity Tolerance: Patient limited by pain;Other (comment) (limited by fear of falling and nausea) Patient left: in chair;with call bell/phone within reach;with chair alarm set;with family/visitor present Nurse Communication: Mobility status PT Visit Diagnosis: Unsteadiness on feet (R26.81);Pain Pain - Right/Left: Right Pain - part of body: Hip    Time: 1206-1231 PT Time Calculation (min) (ACUTE ONLY): 25 min  Charges:   PT Evaluation $PT Eval Moderate Complexity: 1 Mod   PT General Charges $$ ACUTE PT VISIT: 1 Visit         Conni Slipper, PT, DPT Acute Rehabilitation Services Secure Chat Preferred Office: [phone]   Marylynn Pearson 05/31/2023, 3:14 PM

## 2023-05-31 NOTE — Progress Notes (Signed)
Orthopaedic Trauma Service Progress Note  Patient ID: [name] [name] MRN: [id number] DOB/AGE: 1935/05/14 88 y.o.  Subjective:  Pain better than pre-op No new complaints  Uses a cane at baseline   ROS As above  Objective:   VITALS:   Vitals:   05/30/23 2039 05/31/23 0421 05/31/23 0840 05/31/23 0900  BP: (!) 145/87 136/64  (!) 149/72  Pulse: 87 77  84  Resp: 20 18  18   Temp: 98.2 F (36.8 C) 98 F (36.7 C)    TempSrc: Oral Oral    SpO2: 98% 95% 96% 97%  Weight:      Height:        Estimated body mass index is 27.78 kg/m as calculated from the following:   Height as of this encounter: 5\' 1"  (1.549 m).   Weight as of this encounter: 66.7 kg.   Intake/Output      01/21 0701 01/22 0700 01/22 0701 01/23 0700   P.O. 480    I.V. (mL/kg) 200 (3)    IV Piggyback 450    Total Intake(mL/kg) 1130 (16.9)    Urine (mL/kg/hr) 600 (0.4)    Blood 100    Total Output 700    Net +430           LABS  Results for orders placed or performed during the hospital encounter of 05/29/23 (from the past 24 hours)  CBC     Status: Abnormal   Collection Time: 05/31/23  5:30 AM  Result Value Ref Range   WBC 15.1 (H) 4.0 - 10.5 K/uL   RBC 3.78 (L) 3.87 - 5.11 MIL/uL   Hemoglobin 12.0 12.0 - 15.0 g/dL   HCT 56.2 13.0 - 86.5 %   MCV 96.3 80.0 - 100.0 fL   MCH 31.7 26.0 - 34.0 pg   MCHC 33.0 30.0 - 36.0 g/dL   RDW 78.4 69.6 - 29.5 %   Platelets 314 150 - 400 K/uL   nRBC 0.0 0.0 - 0.2 %  Basic metabolic panel     Status: Abnormal   Collection Time: 05/31/23  5:30 AM  Result Value Ref Range   Sodium 133 (L) 135 - 145 mmol/L   Potassium 4.4 3.5 - 5.1 mmol/L   Chloride 96 (L) 98 - 111 mmol/L   CO2 26 22 - 32 mmol/L   Glucose, Bld 149 (H) 70 - 99 mg/dL   BUN 16 8 - 23 mg/dL   Creatinine, Ser 2.84 0.44 - 1.00 mg/dL   Calcium 9.3 8.9 - 13.2 mg/dL   GFR, Estimated >44 >01 mL/min   Anion gap 11 5 - 15    *Note: Due to a large number of results and/or encounters for the requested time period, some results have not been displayed. A complete set of results can be found in Results Review.     PHYSICAL EXAM:   Gen: resting comfortably in bed, NAD, pleasant, husband at bedside  Lungs: unlabored Ext:       Right Lower Extremity Dressing is clean, dry and intact  Extremity is warm  No DCT  Compartments are soft  No pain out of proportion with passive stretching of his toes or ankle  DPN, SPN, TN sensory functions are intact  EHL, FHL, lesser toe motor functions intact  Ankle flexion, extension, inversion eversion intact  +  DP pulse  Length and rotation of L leg looks symmetric to contralateral side    Assessment/Plan: 1 Day Post-Op   Principal Problem:   Closed right hip fracture (HCC)   Anti-infectives (From admission, onward)    Start     Dose/Rate Route Frequency Ordered Stop   05/30/23 1830  ceFAZolin (ANCEF) IVPB 2g/100 mL premix        2 g 200 mL/hr over 30 Minutes Intravenous Every 6 hours 05/30/23 1656 05/31/23 0117   05/30/23 0930  ceFAZolin (ANCEF) IVPB 2g/100 mL premix        2 g 200 mL/hr over 30 Minutes Intravenous On call to O.R. 05/30/23 1610 05/30/23 1315     .  POD/HD#: 1  88 y/o female s/p fall with R subtrochanteric femur fracture   - fall  -R subtrochanteric femur fracture s/p IMN Weightbearing WBAT R leg with walker    ROM/Activity   Unrestricted ROM R hip and knee    Therapy evals   Wound care   Daily wound care as needed starting tomorrow     - Pain management:  Multimodal  Minimize narcotics   - ABL anemia/Hemodynamics  Stable  - Medical issues   Per primary   - DVT/PE prophylaxis:  Lovenox x 28 days post op   - ID:   Periop abx   - Metabolic Bone Disease:  Vitamin d levels pending   Fracture is a fragility fracture  - Activity:  As tolerated   - Impediments to fracture healing:  Fragility fracture   -  Dispo:  Therapy evals  TOC consult    Mearl Latin, PA-C [phone] (C) 05/31/2023, 9:41 AM  [facility] [address] [place] [address] [phone] [address] [phone] (F)    After 5pm and on the weekends please log on to Amion, go to orthopaedics and the look under the [facility] Call for the provider(s) on call. You can also call our office at [phone] and then follow the prompts to be connected to the call team.  Patient ID: [name] [name], female   DOB: 03-01-1936, 88 y.o.   MRN: [id number]

## 2023-05-31 NOTE — Anesthesia Postprocedure Evaluation (Signed)
Anesthesia Post Note  Patient: [name] [name]  Procedure(s) Performed: INTRAMEDULLARY (IM) NAIL INTERTROCHANTERIC (Right)     Patient location during evaluation: PACU Anesthesia Type: General Level of consciousness: awake and alert Pain management: pain level controlled Vital Signs Assessment: post-procedure vital signs reviewed and stable Respiratory status: spontaneous breathing, nonlabored ventilation, respiratory function stable and patient connected to nasal cannula oxygen Cardiovascular status: blood pressure returned to baseline and stable Postop Assessment: no apparent nausea or vomiting Anesthetic complications: no   No notable events documented.  Last Vitals:  Vitals:   05/31/23 1005 05/31/23 1506  BP: 131/67 117/66  Pulse: 77 73  Resp: 18 18  Temp: 36.7 C 37.1 C  SpO2: 97% 99%    Last Pain:  Vitals:   05/31/23 1627  TempSrc:   PainSc: 8                  Mariann Barter

## 2023-05-31 NOTE — Progress Notes (Signed)
PROGRESS NOTE    [name] [name]  WGN:[id number] DOB: 1935-06-06 DOA: 05/29/2023 PCP: Lucky Cowboy, MD     Brief Narrative:  [name] [name] is an 88 y.o. female with medical history significant for hypertension, depression, CKD stage III, hiatal hernia, CAD , she was evaluated in the ER for mechanical fall at home on 1/14, x-rays then were unremarkable, 1/20 morning, she got out of bed went to reach for her walker, missed it and fell to the floor, back in the ER, x-rays noted right hip fracture, orthopedics consulted.  Patient underwent IMN 05/30/2023.  New events last 24 hours / Subjective: Patient without any new complaints today.  Some nausea without vomiting.  Assessment & Plan:   Principal Problem:   Closed right hip fracture [facility]) Active Problems:   Essential hypertension   Gastroesophageal reflux disease   Leukocytosis  Right subtrochanteric femur fracture -Status post IMN 05/30/2023 -PT OT -Weightbearing as tolerated right lower extremity -Lovenox for DVT prophylaxis  Leukocytosis -Reactive, in setting of fracture -Monitor  Anxiety -BuSpar  Hypertension -Propranolol, Norvasc  GERD, hiatal hernia -Protonix, Carafate  Peripheral neuropathy -Neurontin  DVT prophylaxis:  enoxaparin (LOVENOX) injection 40 mg Start: 05/31/23 0800 SCDs Start: 05/30/23 1657 SCDs Start: 05/29/23 1424  Code Status: Full code Family Communication: At bedside Disposition Plan: Pending PT OT Status is: Inpatient Remains inpatient appropriate because: Pending PT OT    Antimicrobials:  Anti-infectives (From admission, onward)    Start     Dose/Rate Route Frequency Ordered Stop   05/30/23 1830  ceFAZolin (ANCEF) IVPB 2g/100 mL premix        2 g 200 mL/hr over 30 Minutes Intravenous Every 6 hours 05/30/23 1656 05/31/23 0117   05/30/23 0930  ceFAZolin (ANCEF) IVPB 2g/100 mL premix        2 g 200 mL/hr over 30 Minutes Intravenous On call to O.R. 05/30/23 0843 05/30/23 1315         Objective: Vitals:   05/31/23 0421 05/31/23 0840 05/31/23 0900 05/31/23 1005  BP: 136/64  (!) 149/72 131/67  Pulse: 77  84 77  Resp: 18  18 18   Temp: 98 F (36.7 C)   98 F (36.7 C)  TempSrc: Oral   Oral  SpO2: 95% 96% 97% 97%  Weight:      Height:        Intake/Output Summary (Last 24 hours) at 05/31/2023 1413 Last data filed at 05/31/2023 0420 Gross per 24 hour  Intake 730 ml  Output 700 ml  Net 30 ml   Filed Weights   05/29/23 1832 05/30/23 0950  Weight: 73.6 kg 66.7 kg    Examination:  General exam: Appears calm and comfortable  Respiratory system: Clear to auscultation. Respiratory effort normal. No respiratory distress. No conversational dyspnea.  Cardiovascular system: S1 & S2 heard, RRR. No murmurs. No pedal edema. Gastrointestinal system: Abdomen is nondistended, soft and nontender. Normal bowel sounds heard. Central nervous system: Alert and oriented. No focal neurological deficits. Speech clear.  Extremities: Symmetric in appearance  Skin: No rashes, lesions or ulcers on exposed skin  Psychiatry: Judgement and insight appear normal. Mood & affect appropriate.   Data Reviewed: I have personally reviewed following labs and imaging studies  CBC: Recent Labs  Lab 05/29/23 0940 05/30/23 0519 05/31/23 0530  WBC 16.1* 11.8* 15.1*  NEUTROABS 13.2*  --   --   HGB 12.9 12.6 12.0  HCT 40.7 38.3 36.4  MCV 96.9 97.2 96.3  PLT 272 272 314  Basic Metabolic Panel: Recent Labs  Lab 05/29/23 0940 05/30/23 0519 05/31/23 0530  NA 138 137 133*  K 3.9 4.1 4.4  CL 104 103 96*  CO2 22 26 26   GLUCOSE 139* 129* 149*  BUN 19 15 16   CREATININE 0.59 0.67 0.77  CALCIUM 9.2 8.8* 9.3   GFR: Estimated Creatinine Clearance: 45 mL/min (by C-G formula based on SCr of 0.77 mg/dL). Liver Function Tests: No results for input(s): "AST", "ALT", "ALKPHOS", "BILITOT", "PROT", "ALBUMIN" in the last 168 hours. No results for input(s): "LIPASE", "AMYLASE" in the last 168  hours. No results for input(s): "AMMONIA" in the last 168 hours. Coagulation Profile: Recent Labs  Lab 05/29/23 1000  INR 0.9   Cardiac Enzymes: No results for input(s): "CKTOTAL", "CKMB", "CKMBINDEX", "TROPONINI" in the last 168 hours. BNP (last 3 results) No results for input(s): "PROBNP" in the last 8760 hours. HbA1C: No results for input(s): "HGBA1C" in the last 72 hours. CBG: No results for input(s): "GLUCAP" in the last 168 hours. Lipid Profile: No results for input(s): "CHOL", "HDL", "LDLCALC", "TRIG", "CHOLHDL", "LDLDIRECT" in the last 72 hours. Thyroid Function Tests: No results for input(s): "TSH", "T4TOTAL", "FREET4", "T3FREE", "THYROIDAB" in the last 72 hours. Anemia Panel: No results for input(s): "VITAMINB12", "FOLATE", "FERRITIN", "TIBC", "IRON", "RETICCTPCT" in the last 72 hours. Sepsis Labs: No results for input(s): "PROCALCITON", "LATICACIDVEN" in the last 168 hours.  Recent Results (from the past 240 hours)  Surgical PCR screen     Status: None   Collection Time: 05/29/23  6:35 PM   Specimen: Nasal Mucosa; Nasal Swab  Result Value Ref Range Status   MRSA, PCR NEGATIVE NEGATIVE Final   Staphylococcus aureus NEGATIVE NEGATIVE Final    Comment: (NOTE) The Xpert SA Assay (FDA approved for NASAL specimens in patients 43 years of age and older), is one component of a comprehensive surveillance program. It is not intended to diagnose infection nor to guide or monitor treatment. Performed at [facility], [address] [place], [place] [address]       Radiology Studies: DG FEMUR PORT, MIN 2 VIEWS RIGHT Result Date: 05/30/2023 CLINICAL DATA:  Postoperative right femur EXAM: RIGHT FEMUR PORTABLE 2 VIEW COMPARISON:  Right hip and femur radiographs 05/29/2023 FINDINGS: Interval long cephalomedullary nail fixation of the previously seen intertrochanteric fracture of the proximal right femur. There are two oblique screws now fixating a proximal nail,  extending into the right femoral neck. There are two distal transverse interlocking screws. No evidence of hardware failure. Persistent fracture line lucency. Mild right femoroacetabular osteoarthritis. Mild osteoarthritis of the medial and lateral compartment of the knee. Moderate to high-grade atherosclerotic calcifications. No knee joint effusion. IMPRESSION: Interval long cephalomedullary nail fixation of the previously seen intertrochanteric fracture of the proximal right femur. No evidence of hardware failure. Electronically Signed   By: Neita Garnet M.D.   On: 05/30/2023 17:47   DG FEMUR, MIN 2 VIEWS RIGHT Result Date: 05/30/2023 CLINICAL DATA:  Elective surgery. Intramedullary nail fixation right femur. EXAM: RIGHT FEMUR 2 VIEWS COMPARISON:  Right femur radiographs 05/29/2023 FINDINGS: Images were performed intraoperatively without the presence of a radiologist. The patient is undergoing long cephalomedullary nail fixation of the previously seen intertrochanteric fracture of the proximal right femur. Near anatomic alignment. No hardware complication is seen. Total fluoroscopy images: 4 Total fluoroscopy time: 94 seconds Total dose: Radiation Exposure Index (as provided by the fluoroscopic device): 19.42 mGy air Kerma Please see intraoperative findings for further detail. IMPRESSION: Intraoperative fluoroscopy for  intramedullary nail fixation of the proximal right femur. Electronically Signed   By: Neita Garnet M.D.   On: 05/30/2023 17:45   DG C-Arm 1-60 Min-No Report Result Date: 05/30/2023 Fluoroscopy was utilized by the requesting physician.  No radiographic interpretation.   DG C-Arm 1-60 Min-No Report Result Date: 05/30/2023 Fluoroscopy was utilized by the requesting physician.  No radiographic interpretation.      Scheduled Meds:  acetaminophen  650 mg Oral Q8H   amLODipine  10 mg Oral Daily   aspirin EC  81 mg Oral Q48H   docusate sodium  100 mg Oral BID   enoxaparin (LOVENOX)  injection  40 mg Subcutaneous Q24H   fesoterodine  4 mg Oral Daily   hydrALAZINE  25 mg Oral TID   mometasone-formoterol  2 puff Inhalation BID   mupirocin ointment  1 Application Nasal BID   pantoprazole  40 mg Oral Daily   propranolol  20 mg Oral BID   rOPINIRole  1 mg Oral QHS   senna-docusate  2 tablet Oral BID   sucralfate  1 g Oral TID WC & HS   Continuous Infusions:   LOS: 2 days   Time spent: 25 minutes   Noralee Stain, DO [facility] 05/31/2023, 2:13 PM   Available via Epic secure chat 7am-7pm After these hours, please refer to coverage provider listed on [email or web address]

## 2023-06-01 DIAGNOSIS — S72001D Fracture of unspecified part of neck of right femur, subsequent encounter for closed fracture with routine healing: Secondary | ICD-10-CM | POA: Diagnosis not present

## 2023-06-01 LAB — CBC
HCT: 34.5 % — ABNORMAL LOW (ref 36.0–46.0)
Hemoglobin: 11.4 g/dL — ABNORMAL LOW (ref 12.0–15.0)
MCH: 31.8 pg (ref 26.0–34.0)
MCHC: 33 g/dL (ref 30.0–36.0)
MCV: 96.1 fL (ref 80.0–100.0)
Platelets: 294 10*3/uL (ref 150–400)
RBC: 3.59 MIL/uL — ABNORMAL LOW (ref 3.87–5.11)
RDW: 13.6 % (ref 11.5–15.5)
WBC: 15.1 10*3/uL — ABNORMAL HIGH (ref 4.0–10.5)
nRBC: 0 % (ref 0.0–0.2)

## 2023-06-01 LAB — BASIC METABOLIC PANEL
Anion gap: 10 (ref 5–15)
BUN: 20 mg/dL (ref 8–23)
CO2: 23 mmol/L (ref 22–32)
Calcium: 8.6 mg/dL — ABNORMAL LOW (ref 8.9–10.3)
Chloride: 95 mmol/L — ABNORMAL LOW (ref 98–111)
Creatinine, Ser: 0.7 mg/dL (ref 0.44–1.00)
GFR, Estimated: >60 mL/min (ref >60)
Glucose, Bld: 131 mg/dL — ABNORMAL HIGH (ref 70–99)
Potassium: 3.9 mmol/L (ref 3.5–5.1)
Sodium: 128 mmol/L — ABNORMAL LOW (ref 135–145)

## 2023-06-01 MED ORDER — POLYETHYLENE GLYCOL 3350 17 G PO PACK
17.0000 g | PACK | Freq: Every day | ORAL | Status: DC
  Administered 2023-06-01 – 2023-06-03 (×3): 17 g via ORAL
  Filled 2023-06-01 (×3): qty 1

## 2023-06-01 MED ORDER — SODIUM CHLORIDE 0.9 % IV SOLN: INTRAVENOUS | Status: AC

## 2023-06-01 NOTE — Progress Notes (Cosign Needed)
Orthopaedic Trauma Service Progress Note  Patient ID: [name] [name] MRN: [id number] DOB/AGE: 86-14-1937 86 y.o.  Subjective:  Sleepy today  Sat in chair with therapy yesterday  +2 assist   ROS As above  Objective:   VITALS:   Vitals:   05/31/23 1959 05/31/23 2048 06/01/23 0840 06/01/23 0902  BP:  132/74 (!) 143/71   Pulse:  82 85   Resp:  20 17   Temp:  98 F (36.7 C) 97.8 F (36.6 C)   TempSrc:  Oral    SpO2: 99% 94% 94% 94%  Weight:      Height:        Estimated body mass index is 27.78 kg/m as calculated from the following:   Height as of this encounter: 5\' 1"  (1.549 m).   Weight as of this encounter: 66.7 kg.   Intake/Output      01/22 0701 01/23 0700 01/23 0701 01/24 0700   P.O. 360    I.V. (mL/kg)     IV Piggyback     Total Intake(mL/kg) 360 (5.4)    Urine (mL/kg/hr) 1400 (0.9)    Blood     Total Output 1400    Net -1040           LABS  Results for orders placed or performed during the hospital encounter of 05/29/23 (from the past 24 hours)  CBC     Status: Abnormal   Collection Time: 06/01/23  6:02 AM  Result Value Ref Range   WBC 15.1 (H) 4.0 - 10.5 K/uL   RBC 3.59 (L) 3.87 - 5.11 MIL/uL   Hemoglobin 11.4 (L) 12.0 - 15.0 g/dL   HCT 40.9 (L) 81.1 - 91.4 %   MCV 96.1 80.0 - 100.0 fL   MCH 31.8 26.0 - 34.0 pg   MCHC 33.0 30.0 - 36.0 g/dL   RDW 78.2 95.6 - 21.3 %   Platelets 294 150 - 400 K/uL   nRBC 0.0 0.0 - 0.2 %  Basic metabolic panel     Status: Abnormal   Collection Time: 06/01/23  6:02 AM  Result Value Ref Range   Sodium 128 (L) 135 - 145 mmol/L   Potassium 3.9 3.5 - 5.1 mmol/L   Chloride 95 (L) 98 - 111 mmol/L   CO2 23 22 - 32 mmol/L   Glucose, Bld 131 (H) 70 - 99 mg/dL   BUN 20 8 - 23 mg/dL   Creatinine, Ser 0.86 0.44 - 1.00 mg/dL   Calcium 8.6 (L) 8.9 - 10.3 mg/dL   GFR, Estimated >57 >84 mL/min   Anion gap 10 5 - 15   *Note: Due to a large number  of results and/or encounters for the requested time period, some results have not been displayed. A complete set of results can be found in Results Review.     PHYSICAL EXAM:   Gen: resting comfortably in bed, NAD, pleasant, husband at bedside  Lungs: unlabored Ext:       Right Lower Extremity Dressing is clean, dry and intact             Extremity is warm             No DCT             Compartments are soft  No pain out of proportion with passive stretching of his toes or ankle             DPN, SPN, TN sensory functions are intact             EHL, FHL, lesser toe motor functions intact             Ankle flexion, extension, inversion eversion intact             + DP pulse             Length and rotation of L leg looks symmetric to contralateral side     Assessment/Plan: 2 Days Post-Op    Anti-infectives (From admission, onward)    Start     Dose/Rate Route Frequency Ordered Stop   05/30/23 1830  ceFAZolin (ANCEF) IVPB 2g/100 mL premix        2 g 200 mL/hr over 30 Minutes Intravenous Every 6 hours 05/30/23 1656 05/31/23 0117   05/30/23 0930  ceFAZolin (ANCEF) IVPB 2g/100 mL premix        2 g 200 mL/hr over 30 Minutes Intravenous On call to O.R. 05/30/23 1610 05/30/23 1315     .  POD/HD#: 2  88 y/o female s/p fall with R subtrochanteric femur fracture    - fall   -R subtrochanteric femur fracture s/p IMN Weightbearing WBAT R leg with walker                ROM/Activity                         Unrestricted ROM R hip and knee                          Therapy evals               Wound care                         Daily wound care as needed starting today   Ok to leave wounds open to air once there is no drainage    Cover with 4x4 gauze and tape or mepilex   Ok to clean with soap and water once drainage stops                 - Pain management:             Multimodal             Minimize narcotics    - ABL anemia/Hemodynamics             Stable    - Medical issues              Per primary    - DVT/PE prophylaxis:             Lovenox x 28 days post op    - ID:              Periop abx    - Metabolic Bone Disease:             Vitamin d levels levels look good (63.89 ng/mL)             Fracture is a fragility fracture   - Activity:             As tolerated     - Impediments to fracture healing:  Fragility fracture              - Dispo:             Therapy evals             TOC consult    Suspect she will need a SNF though she and husband would like to dc home      Mearl Latin, PA-C [phone] (C) 06/01/2023, 9:27 AM  [facility] [address] [place] [address] [phone] Collier Bullock (F)    After 5pm and on the weekends please log on to Amion, go to orthopaedics and the look under the [facility] Call for the provider(s) on call. You can also call our office at [phone] and then follow the prompts to be connected to the call team.  Patient ID: [name], female   DOB: May 24, 1935, 88 y.o.   MRN: [id number]

## 2023-06-01 NOTE — [place] FL2 (Signed)
[place] MEDICAID FL2 LEVEL OF CARE FORM     IDENTIFICATION  Patient Name: [name] [name] Birthdate: 05-14-1935 Sex: female Admission Date (Current Location): 05/29/2023  [facility] and [place] Number:  Producer, television/film/video and Address:  [facility], [address], [place], [place] [address]      Provider Number: [id number]  Attending Physician Name and Address:  Noralee Stain, DO  Relative Name and Phone Number:  Cilia,[name] Spouse [phone]  [phone]    Current Level of Care: Hospital Recommended Level of Care: Skilled Nursing Facility Prior Approval Number:    Date Approved/Denied:   PASRR Number: [id number]  Discharge Plan: SNF    Current Diagnoses: Patient Active Problem List   Diagnosis Date Noted   Leukocytosis 05/31/2023   Closed right hip fracture (HCC) 05/29/2023   Abnormal CT scan, colon 09/02/2022   LLQ pain 09/02/2022   Nausea and vomiting 09/02/2022   Generalized abdominal pain 07/27/2022   Nausea without vomiting 07/27/2022   Poor appetite 07/27/2022   Iron deficiency 10/16/2020   History of adenomatous polyp of colon 07/21/2020   History of fundoplication    Senile purpura (HCC) 07/05/2019   Abnormal glucose (prediabetes) 03/01/2018   FHx: heart disease 03/01/2018   CKD (chronic kidney disease) stage 2, GFR 60-89 ml/min 09/27/2017   Chronic bronchitis (HCC) 08/09/2017   Atherosclerosis of aorta (HCC) by Lumbar CYT scan 01/29/2021 04/05/2017   Odynophagia    Spinal stenosis, lumbar region, with neurogenic claudication 05/29/2015   DDD (degenerative disc disease), lumbar 04/21/2015   Hyperlipidemia, mixed 11/24/2014   Gastroesophageal reflux disease 11/24/2014   Depression, major, in partial remission (HCC) 09/26/2014   Chronic pain syndrome 09/26/2014   Coronary artery disease involving native heart without angina pectoris    Osteoporosis 02/10/2014   Vitamin D deficiency 08/14/2013   MGUS (monoclonal  gammopathy of unknown significance) 03/05/2013   ESOPHAGEAL STRICTURE 08/28/2008   Mild intermittent asthma without complication 07/29/2008   Incarcerated recurrent paraesophageal hiatal hernia s/p lap redo repair [id number] 01/31/2008   Essential hypertension 07/25/2007    Orientation RESPIRATION BLADDER Height & Weight     Self, Time, Situation, Place  Normal Incontinent, External catheter Weight: 147 lb (66.7 kg) Height:  5\' 1"  (154.9 cm)  BEHAVIORAL SYMPTOMS/MOOD NEUROLOGICAL BOWEL NUTRITION STATUS      Continent Diet (see discharge summary)  AMBULATORY STATUS COMMUNICATION OF NEEDS Skin   Extensive Assist Verbally Surgical wounds, Other (Comment) (ecchymosis)                       Personal Care Assistance Level of Assistance  Bathing, Feeding, Dressing Bathing Assistance: Maximum assistance Feeding assistance: Limited assistance Dressing Assistance: Maximum assistance     Functional Limitations Info  Sight, Hearing, Speech Sight Info: Adequate Hearing Info: Adequate Speech Info: Adequate    SPECIAL CARE FACTORS FREQUENCY  PT (By licensed PT), OT (By licensed OT)     PT Frequency: 5x week OT Frequency: 5x week            Contractures Contractures Info: Not present    Additional Factors Info  Code Status, Allergies Code Status Info: full Allergies Info: Ace Inhibitors, Aspartame, Atorvastatin, Biaxin (Clarithromycin), Daliresp (Roflumilast), Erythromycin, Levofloxacin           Current Medications (06/01/2023):  This is the current hospital active medication list Current Facility-Administered Medications  Medication Dose Route Frequency Provider Last Rate Last Admin   0.9 %  sodium chloride infusion  Intravenous Continuous Noralee Stain, DO 100 mL/hr at 06/01/23 1022 New Bag at 06/01/23 1022   acetaminophen (TYLENOL) tablet 650 mg  650 mg Oral Q6H PRN Montez Morita, PA-C       Or   acetaminophen (TYLENOL) suppository 650 mg  650 mg Rectal Q6H PRN Montez Morita, PA-C       acetaminophen (TYLENOL) tablet 650 mg  650 mg Oral Q8H Montez Morita, PA-C   650 mg at 06/01/23 1007   albuterol (PROVENTIL) (2.5 MG/3ML) 0.083% nebulizer solution 2.5 mg  2.5 mg Nebulization Q2H PRN Montez Morita, PA-C       amLODipine (NORVASC) tablet 10 mg  10 mg Oral Daily Montez Morita, PA-C   10 mg at 06/01/23 1007   aspirin EC tablet 81 mg  81 mg Oral Q48H Montez Morita, PA-C   81 mg at 05/30/23 2041   busPIRone (BUSPAR) tablet 5 mg  5 mg Oral TID BM PRN Montez Morita, PA-C       docusate sodium (COLACE) capsule 100 mg  100 mg Oral BID Montez Morita, PA-C   100 mg at 06/01/23 1008   enoxaparin (LOVENOX) injection 40 mg  40 mg Subcutaneous Q24H Montez Morita, PA-C   40 mg at 06/01/23 1008   feeding supplement (ENSURE ENLIVE / ENSURE PLUS) liquid 237 mL  237 mL Oral BID BM Noralee Stain, DO       fesoterodine (TOVIAZ) tablet 4 mg  4 mg Oral Daily Montez Morita, PA-C   4 mg at 06/01/23 1008   gabapentin (NEURONTIN) capsule 600 mg  600 mg Oral TID PRN Montez Morita, PA-C       hydrALAZINE (APRESOLINE) tablet 25 mg  25 mg Oral TID Zannie Cove, MD   25 mg at 06/01/23 1007   HYDROmorphone (DILAUDID) injection 0.5-1 mg  0.5-1 mg Intravenous Q2H PRN Montez Morita, PA-C   1 mg at 06/01/23 0547   melatonin tablet 5 mg  5 mg Oral QHS PRN Montez Morita, PA-C       menthol-cetylpyridinium (CEPACOL) lozenge 3 mg  1 lozenge Oral PRN Montez Morita, PA-C       Or   phenol (CHLORASEPTIC) mouth spray 1 spray  1 spray Mouth/Throat PRN Montez Morita, PA-C       metoCLOPramide (REGLAN) tablet 5-10 mg  5-10 mg Oral Q8H PRN Montez Morita, PA-C       Or   metoCLOPramide (REGLAN) injection 5-10 mg  5-10 mg Intravenous Q8H PRN Montez Morita, PA-C       mometasone-formoterol (DULERA) 200-5 MCG/ACT inhaler 2 puff  2 puff Inhalation BID Montez Morita, PA-C   2 puff at 06/01/23 0902   mupirocin ointment (BACTROBAN) 2 % 1 Application  1 Application Nasal BID Montez Morita, PA-C   1 Application at 06/01/23 1008   ondansetron  (ZOFRAN) tablet 4 mg  4 mg Oral Q6H PRN Montez Morita, PA-C   4 mg at 05/31/23 0119   Or   ondansetron (ZOFRAN) injection 4 mg  4 mg Intravenous Q6H PRN Montez Morita, PA-C   4 mg at 06/01/23 1610   oxyCODONE (Oxy IR/ROXICODONE) immediate release tablet 5-10 mg  5-10 mg Oral Q4H PRN Montez Morita, PA-C   5 mg at 05/31/23 1627   pantoprazole (PROTONIX) EC tablet 40 mg  40 mg Oral Daily Montez Morita, PA-C   40 mg at 06/01/23 1007   propranolol (INDERAL) tablet 20 mg  20 mg Oral BID Montez Morita, PA-C   20 mg at 06/01/23 1008  rOPINIRole (REQUIP) tablet 1 mg  1 mg Oral QHS Montez Morita, PA-C   1 mg at 05/31/23 2050   senna-docusate (Senokot-S) tablet 2 tablet  2 tablet Oral BID Montez Morita, PA-C   2 tablet at 06/01/23 1008   sucralfate (CARAFATE) tablet 1 g  1 g Oral TID WC & HS Montez Morita, PA-C   1 g at 06/01/23 0900     Discharge Medications: Please see discharge summary for a list of discharge medications.  Relevant Imaging Results:  Relevant Lab Results:   Additional Information ss#[id number]  Lorri Frederick, LCSW

## 2023-06-01 NOTE — Plan of Care (Signed)
  Problem: Education: Goal: Knowledge of General Education information will improve Description: Including pain rating scale, medication(s)/side effects and non-pharmacologic comfort measures Outcome: Progressing   Problem: Activity: Goal: Risk for activity intolerance will decrease Outcome: Progressing   Problem: Nutrition: Goal: Adequate nutrition will be maintained Outcome: Progressing   Problem: Coping: Goal: Level of anxiety will decrease Outcome: Progressing   Problem: Activity: Goal: Ability to ambulate and perform ADLs will improve Outcome: Progressing

## 2023-06-01 NOTE — Progress Notes (Signed)
PROGRESS NOTE    [name] [name]  YQM:[id number] DOB: 1936-04-20 DOA: 05/29/2023 PCP: Lucky Cowboy, MD     Brief Narrative:  [name] [name] is an 88 y.o. female with medical history significant for hypertension, depression, CKD stage III, hiatal hernia, CAD , she was evaluated in the ER for mechanical fall at home on 1/14, x-rays then were unremarkable, 1/20 morning, she got out of bed went to reach for her walker, missed it and fell to the floor, back in the ER, x-rays noted right hip fracture, orthopedics consulted.  Patient underwent IMN 05/30/2023.  New events last 24 hours / Subjective: No new issues overnight. States she tolerated PT well   Assessment & Plan:   Principal Problem:   Closed right hip fracture [facility]) Active Problems:   Essential hypertension   Gastroesophageal reflux disease   Leukocytosis  Right subtrochanteric femur fracture -Status post IMN 05/30/2023 -PT OT rec SNF  -Weightbearing as tolerated right lower extremity -Lovenox for DVT prophylaxis  Leukocytosis -Reactive, in setting of fracture -Monitor  Anxiety -BuSpar  Hypertension -Propranolol, Norvasc  GERD, hiatal hernia -Protonix, Carafate  Peripheral neuropathy -Neurontin  Hyponatremia -Start IVF 500cc today   DVT prophylaxis:  enoxaparin (LOVENOX) injection 40 mg Start: 05/31/23 0800 SCDs Start: 05/30/23 1657 SCDs Start: 05/29/23 1424  Code Status: Full code Family Communication: At bedside Disposition Plan: SNF Status is: Inpatient Remains inpatient appropriate because: Pending SNF placement     Antimicrobials:  Anti-infectives (From admission, onward)    Start     Dose/Rate Route Frequency Ordered Stop   05/30/23 1830  ceFAZolin (ANCEF) IVPB 2g/100 mL premix        2 g 200 mL/hr over 30 Minutes Intravenous Every 6 hours 05/30/23 1656 05/31/23 0117   05/30/23 0930  ceFAZolin (ANCEF) IVPB 2g/100 mL premix        2 g 200 mL/hr over 30 Minutes Intravenous On call to O.R.  05/30/23 0843 05/30/23 1315        Objective: Vitals:   05/31/23 1959 05/31/23 2048 06/01/23 0840 06/01/23 0902  BP:  132/74 (!) 143/71   Pulse:  82 85   Resp:  20 17   Temp:  98 F (36.7 C) 97.8 F (36.6 C)   TempSrc:  Oral    SpO2: 99% 94% 94% 94%  Weight:      Height:        Intake/Output Summary (Last 24 hours) at 06/01/2023 1230 Last data filed at 06/01/2023 0300 Gross per 24 hour  Intake 360 ml  Output 1400 ml  Net -1040 ml   Filed Weights   05/29/23 1832 05/30/23 0950  Weight: 73.6 kg 66.7 kg    Examination:  General exam: Appears calm and comfortable  Respiratory system: Clear to auscultation. Respiratory effort normal. No respiratory distress. No conversational dyspnea.  Cardiovascular system: S1 & S2 heard, RRR. No murmurs. No pedal edema. Gastrointestinal system: Abdomen is nondistended, soft and nontender. Normal bowel sounds heard. Central nervous system: Alert and oriented. No focal neurological deficits. Speech clear.  Extremities: Symmetric in appearance  Skin: No rashes, lesions or ulcers on exposed skin  Psychiatry: Judgement and insight appear normal. Mood & affect appropriate.   Data Reviewed: I have personally reviewed following labs and imaging studies  CBC: Recent Labs  Lab 05/29/23 0940 05/30/23 0519 05/31/23 0530 06/01/23 0602  WBC 16.1* 11.8* 15.1* 15.1*  NEUTROABS 13.2*  --   --   --   HGB 12.9 12.6 12.0 11.4*  HCT 40.7 38.3 36.4 34.5*  MCV 96.9 97.2 96.3 96.1  PLT 272 272 314 294   Basic Metabolic Panel: Recent Labs  Lab 05/29/23 0940 05/30/23 0519 05/31/23 0530 06/01/23 0602  NA 138 137 133* 128*  K 3.9 4.1 4.4 3.9  CL 104 103 96* 95*  CO2 22 26 26 23   GLUCOSE 139* 129* 149* 131*  BUN 19 15 16 20   CREATININE 0.59 0.67 0.77 0.70  CALCIUM 9.2 8.8* 9.3 8.6*   GFR: Estimated Creatinine Clearance: 45 mL/min (by C-G formula based on SCr of 0.7 mg/dL). Liver Function Tests: No results for input(s): "AST", "ALT",  "ALKPHOS", "BILITOT", "PROT", "ALBUMIN" in the last 168 hours. No results for input(s): "LIPASE", "AMYLASE" in the last 168 hours. No results for input(s): "AMMONIA" in the last 168 hours. Coagulation Profile: Recent Labs  Lab 05/29/23 1000  INR 0.9   Cardiac Enzymes: No results for input(s): "CKTOTAL", "CKMB", "CKMBINDEX", "TROPONINI" in the last 168 hours. BNP (last 3 results) No results for input(s): "PROBNP" in the last 8760 hours. HbA1C: No results for input(s): "HGBA1C" in the last 72 hours. CBG: No results for input(s): "GLUCAP" in the last 168 hours. Lipid Profile: No results for input(s): "CHOL", "HDL", "LDLCALC", "TRIG", "CHOLHDL", "LDLDIRECT" in the last 72 hours. Thyroid Function Tests: No results for input(s): "TSH", "T4TOTAL", "FREET4", "T3FREE", "THYROIDAB" in the last 72 hours. Anemia Panel: No results for input(s): "VITAMINB12", "FOLATE", "FERRITIN", "TIBC", "IRON", "RETICCTPCT" in the last 72 hours. Sepsis Labs: No results for input(s): "PROCALCITON", "LATICACIDVEN" in the last 168 hours.  Recent Results (from the past 240 hours)  Surgical PCR screen     Status: None   Collection Time: 05/29/23  6:35 PM   Specimen: Nasal Mucosa; Nasal Swab  Result Value Ref Range Status   MRSA, PCR NEGATIVE NEGATIVE Final   Staphylococcus aureus NEGATIVE NEGATIVE Final    Comment: (NOTE) The Xpert SA Assay (FDA approved for NASAL specimens in patients 80 years of age and older), is one component of a comprehensive surveillance program. It is not intended to diagnose infection nor to guide or monitor treatment. Performed at [facility], [address], [place], [place] [address]       Radiology Studies: DG FEMUR PORT, MIN 2 VIEWS RIGHT Result Date: 05/30/2023 CLINICAL DATA:  Postoperative right femur EXAM: RIGHT FEMUR PORTABLE 2 VIEW COMPARISON:  Right hip and femur radiographs 05/29/2023 FINDINGS: Interval long cephalomedullary nail fixation of the previously  seen intertrochanteric fracture of the proximal right femur. There are two oblique screws now fixating a proximal nail, extending into the right femoral neck. There are two distal transverse interlocking screws. No evidence of hardware failure. Persistent fracture line lucency. Mild right femoroacetabular osteoarthritis. Mild osteoarthritis of the medial and lateral compartment of the knee. Moderate to high-grade atherosclerotic calcifications. No knee joint effusion. IMPRESSION: Interval long cephalomedullary nail fixation of the previously seen intertrochanteric fracture of the proximal right femur. No evidence of hardware failure. Electronically Signed   By: Neita Garnet M.D.   On: 05/30/2023 17:47   DG FEMUR, MIN 2 VIEWS RIGHT Result Date: 05/30/2023 CLINICAL DATA:  Elective surgery. Intramedullary nail fixation right femur. EXAM: RIGHT FEMUR 2 VIEWS COMPARISON:  Right femur radiographs 05/29/2023 FINDINGS: Images were performed intraoperatively without the presence of a radiologist. The patient is undergoing long cephalomedullary nail fixation of the previously seen intertrochanteric fracture of the proximal right femur. Near anatomic alignment. No hardware complication is seen. Total fluoroscopy images: 4 Total fluoroscopy  time: 94 seconds Total dose: Radiation Exposure Index (as provided by the fluoroscopic device): 19.42 mGy air Kerma Please see intraoperative findings for further detail. IMPRESSION: Intraoperative fluoroscopy for intramedullary nail fixation of the proximal right femur. Electronically Signed   By: Neita Garnet M.D.   On: 05/30/2023 17:45   DG C-Arm 1-60 Min-No Report Result Date: 05/30/2023 Fluoroscopy was utilized by the requesting physician.  No radiographic interpretation.   DG C-Arm 1-60 Min-No Report Result Date: 05/30/2023 Fluoroscopy was utilized by the requesting physician.  No radiographic interpretation.      Scheduled Meds:  acetaminophen  650 mg Oral Q8H    amLODipine  10 mg Oral Daily   aspirin EC  81 mg Oral Q48H   docusate sodium  100 mg Oral BID   enoxaparin (LOVENOX) injection  40 mg Subcutaneous Q24H   feeding supplement  237 mL Oral BID BM   fesoterodine  4 mg Oral Daily   hydrALAZINE  25 mg Oral TID   mometasone-formoterol  2 puff Inhalation BID   mupirocin ointment  1 Application Nasal BID   pantoprazole  40 mg Oral Daily   propranolol  20 mg Oral BID   rOPINIRole  1 mg Oral QHS   senna-docusate  2 tablet Oral BID   sucralfate  1 g Oral TID WC & HS   Continuous Infusions:  sodium chloride 100 mL/hr at 06/01/23 1022     LOS: 3 days   Time spent: 25 minutes   Noralee Stain, DO [facility] 06/01/2023, 12:30 PM   Available via Epic secure chat 7am-7pm After these hours, please refer to coverage provider listed on [email or web address]

## 2023-06-01 NOTE — Care Management Important Message (Signed)
Important Message  Patient Details  Name: [name] [name] MRN: [id number] Date of Birth: 1936/03/25   Important Message Given:  Yes - Medicare IM     Dorena Bodo 06/01/2023, 2:42 PM

## 2023-06-01 NOTE — Progress Notes (Signed)
Inpatient Rehab Admissions Coordinator:   Per [facility] request pt was screened for CIR by Estill Dooms, PT, DPT.  Chart reviewed.  Pt admitted following a fall with R hip fx, now s/p IMN.  She is WBAT with a RW and requiring moderate assistance of 2 persons for mobility, and up to maximal assistance of 2 for lower body dressing.  Post op course uncomplicated: negligible drop in Hgb of less than 1, BPs stable, and pain controlled.  Unfortunately, her HTA will not approve her for CIR admission at this time.    Estill Dooms, PT, DPT Admissions Coordinator [phone] 06/01/23  11:09 AM

## 2023-06-01 NOTE — Progress Notes (Signed)
Physical Therapy Treatment  Patient Details Name: [name] [name] MRN: [id number] DOB: 1936/03/22 Today's Date: 06/01/2023   History of Present Illness Pt is a 88 y/o female presenting on 1/20 after fall. Found with R hip fx, 1/21 S/P IMN, WBAT with walker. PMH includes: anxiety, CAD, chronic back pain, CKD, hiatal hernia, HTN, prior back surgery.    PT Comments  Pt endorses increased pain and nausea today, however overall appears to be improving with functional mobility. Deferred ambulation due to nausea. Pt was able to initiate therapeutic exercise this session with increased AROM on the RLE. Will continue to follow and progress as able per POC. PT recommendations remain appropriate at this time.     If plan is discharge home, recommend the following: Two people to help with walking and/or transfers;Two people to help with bathing/dressing/bathroom;Assistance with cooking/housework;Assist for transportation;Help with stairs or ramp for entrance   Can travel by private vehicle     No  Equipment Recommendations  None recommended by PT    Recommendations for Other Services       Precautions / Restrictions Precautions Precautions: Fall Restrictions Weight Bearing Restrictions Per Provider Order: Yes RLE Weight Bearing Per Provider Order: Weight bearing as tolerated     Mobility  Bed Mobility Overal bed mobility: Needs Assistance Bed Mobility: Supine to Sit     Supine to sit: Mod assist, +2 for physical assistance     General bed mobility comments: assist for R LE, trunk and scooting forward. Increased time and bed pad utilized for transition to full sitting.    Transfers Overall transfer level: Needs assistance Equipment used: Rolling walker (2 wheels) Transfers: Sit to/from Stand, Bed to chair/wheelchair/BSC Sit to Stand: Mod assist, +2 physical assistance Stand pivot transfers: Mod assist, +2 physical assistance         General transfer comment: Pt with improved  posture and improved ability to advance LE's around during pivotal steps to the recliner. No overt LOB noted. +2 continues to be required for walker management and balance support.    Ambulation/Gait               General Gait Details: Unable to progress to gait training at this time.   Stairs             Wheelchair Mobility     Tilt Bed    Modified Rankin (Stroke Patients Only)       Balance Overall balance assessment: Needs assistance Sitting-balance support: No upper extremity supported, Feet supported Sitting balance-Leahy Scale: Fair Sitting balance - Comments: limited dynamically   Standing balance support: Bilateral upper extremity supported, During functional activity Standing balance-Leahy Scale: Poor Standing balance comment: relies on BUE and external support                            Cognition Arousal: Alert Behavior During Therapy: Anxious Overall Cognitive Status: Within Functional Limits for tasks assessed                                 General Comments: pt internally distracted by pain and nausea, but overall appears WFL.  Some slow processing but not formally assessed        Exercises General Exercises - Lower Extremity Ankle Circles/Pumps: 20 reps Quad Sets: 10 reps Long Arc Quad: 15 reps    General Comments  Pertinent Vitals/Pain Pain Assessment Pain Assessment: Faces Faces Pain Scale: Hurts even more Pain Location: R hip Pain Descriptors / Indicators: Discomfort, Operative site guarding Pain Intervention(s): Limited activity within patient's tolerance, Monitored during session, Repositioned    Home Living                          Prior Function            PT Goals (current goals can now be found in the care plan section) Acute Rehab PT Goals Patient Stated Goal: Home at d/c PT Goal Formulation: With patient/family Time For Goal Achievement: 06/13/23 Potential to Achieve  Goals: Good Progress towards PT goals: Progressing toward goals    Frequency    Min 1X/week      PT Plan      Co-evaluation              AM-PAC PT "6 Clicks" Mobility   Outcome Measure  Help needed turning from your back to your side while in a flat bed without using bedrails?: Total Help needed moving from lying on your back to sitting on the side of a flat bed without using bedrails?: Total Help needed moving to and from a bed to a chair (including a wheelchair)?: Total Help needed standing up from a chair using your arms (e.g., wheelchair or bedside chair)?: Total Help needed to walk in hospital room?: Total Help needed climbing 3-5 steps with a railing? : Total 6 Click Score: 6    End of Session Equipment Utilized During Treatment: Gait belt Activity Tolerance: Patient limited by pain (nausea) Patient left: in chair;with call bell/phone within reach;with chair alarm set;with family/visitor present Nurse Communication: Mobility status PT Visit Diagnosis: Unsteadiness on feet (R26.81);Pain Pain - Right/Left: Right Pain - part of body: Hip     Time: 0981-1914 PT Time Calculation (min) (ACUTE ONLY): 28 min  Charges:    $Gait Training: 23-37 mins PT General Charges $$ ACUTE PT VISIT: 1 Visit                     Conni Slipper, PT, DPT Acute Rehabilitation Services Secure Chat Preferred Office: [phone]    [name] [name] 06/01/2023, 2:24 PM

## 2023-06-01 NOTE — TOC Initial Note (Addendum)
Transition of Care [facility]) - Initial/Assessment Note    Patient Details  Name: [name] [name] MRN: [id number] Date of Birth: Feb 11, 1936  Transition of Care [facility]) CM/SW Contact:    Lorri Frederick, LCSW Phone Number: 06/01/2023, 10:41 AM  Clinical Narrative:   CSW met with pt and husband [name] to discuss PT recommendation for SNF.  Both pt and husband say No when CSW mentions SNF, pt was at prior SNF last year and had poor experience.  Pt from home with husband, no current services.  Discussed pt needing 2 person assist and unable to walk yesterday, they report husband home full time and daughter lives across the street and also able to assist.  After more discussion, they agreed to review list of potential SNF from medicare.gov choice document, which was provided to them.  They are also asking if pt is candidate for CIR.  CSW reached out to Caitlin/CIR to review and was informed that pt insurance will not approve CIR for this diagnosis.  Pt and husband informed of this.     They are in agreement for SNF referral to be sent out to higher rated SNF in [place] area, which was done.         1530: TC husband [name], discussed 2 bed offers: [facility] and [facility].  He accepts offer at [facility].  [facility] informed.    Auth request made, LM with [employer] advantage.     Expected Discharge Plan: Skilled Nursing Facility Barriers to Discharge: Continued Medical Work up, Other (must enter comment) (family initially refusing SNF, but agreed to think abou it)   Patient Goals and CMS Choice Patient states their goals for this hospitalization and ongoing recovery are:: "good as new" CMS Medicare.gov Compare Post Acute Care list provided to:: Patient Represenative (must comment) (husband [name]) Choice offered to / list presented to : Patient, Spouse      Expected Discharge Plan and Services In-house Referral: Clinical Social Work   Post Acute Care Choice:  (TBD) Living arrangements for  the past 2 months: Single Family Home                                      Prior Living Arrangements/Services Living arrangements for the past 2 months: Single Family Home Lives with:: Spouse Patient language and need for interpreter reviewed:: Yes Do you feel safe going back to the place where you live?: Yes      Need for Family Participation in Patient Care: Yes (Comment) Care giver support system in place?: Yes (comment) Current home services: Other (comment) (none) Criminal Activity/Legal Involvement Pertinent to Current Situation/Hospitalization: No - Comment as needed  Activities of Daily Living   ADL Screening (condition at time of admission) Independently performs ADLs?: No Does the patient have a NEW difficulty with bathing/dressing/toileting/self-feeding that is expected to last >3 days?: Yes (Initiates electronic notice to provider for possible OT consult) Does the patient have a NEW difficulty with getting in/out of bed, walking, or climbing stairs that is expected to last >3 days?: Yes (Initiates electronic notice to provider for possible PT consult) Does the patient have a NEW difficulty with communication that is expected to last >3 days?: No Is the patient deaf or have difficulty hearing?: No Does the patient have difficulty seeing, even when wearing glasses/contacts?: No Does the patient have difficulty concentrating, remembering, or making decisions?: No  Permission Sought/Granted Permission sought to  share information with : Family Supports Permission granted to share information with : Yes, Verbal Permission Granted  Share Information with NAME: husband Gerlene Burdock, son Clide Cliff, daughter French Ana           Emotional Assessment Appearance:: Appears stated age Attitude/Demeanor/Rapport: Engaged Affect (typically observed): Appropriate, Pleasant Orientation: : Oriented to Self, Oriented to Place, Oriented to  Time, Oriented to Situation      Admission  diagnosis:  Closed right hip fracture (HCC) [S72.001A] Closed displaced comminuted fracture of shaft of right femur, initial encounter (HCC) [S72.351A] Fall, initial encounter [W19.XXXA] Patient Active Problem List   Diagnosis Date Noted   Leukocytosis 05/31/2023   Closed right hip fracture (HCC) 05/29/2023   Abnormal CT scan, colon 09/02/2022   LLQ pain 09/02/2022   Nausea and vomiting 09/02/2022   Generalized abdominal pain 07/27/2022   Nausea without vomiting 07/27/2022   Poor appetite 07/27/2022   Iron deficiency 10/16/2020   History of adenomatous polyp of colon 07/21/2020   History of fundoplication    Senile purpura (HCC) 07/05/2019   Abnormal glucose (prediabetes) 03/01/2018   FHx: heart disease 03/01/2018   CKD (chronic kidney disease) stage 2, GFR 60-89 ml/min 09/27/2017   Chronic bronchitis (HCC) 08/09/2017   Atherosclerosis of aorta (HCC) by Lumbar CYT scan 01/29/2021 04/05/2017   Odynophagia    Spinal stenosis, lumbar region, with neurogenic claudication 05/29/2015   DDD (degenerative disc disease), lumbar 04/21/2015   Hyperlipidemia, mixed 11/24/2014   Gastroesophageal reflux disease 11/24/2014   Depression, major, in partial remission (HCC) 09/26/2014   Chronic pain syndrome 09/26/2014   Coronary artery disease involving native heart without angina pectoris    Osteoporosis 02/10/2014   Vitamin D deficiency 08/14/2013   MGUS (monoclonal gammopathy of unknown significance) 03/05/2013   ESOPHAGEAL STRICTURE 08/28/2008   Mild intermittent asthma without complication 07/29/2008   Incarcerated recurrent paraesophageal hiatal hernia s/p lap redo repair [id number] 01/31/2008   Essential hypertension 07/25/2007   PCP:  Lucky Cowboy, MD Pharmacy:   [id number] - [address] ([place]), [place] - [address] [place]) [place] [address] Phone: [phone] Fax: [phone]  [facility] [address] [place] [address] Phone: [phone] Fax: [phone]  [id number] [place], [place] - [address] AT [facility] OF [address] [place] [address] Phone: [phone] Fax: [phone]  [facility] [address] [place] [address] Phone: [phone] Fax: [phone]     Social Drivers of Health (SDOH) Social History: SDOH Screenings   Food Insecurity: No Food Insecurity (05/29/2023)  Housing: Low Risk  (05/29/2023)  Transportation Needs: No Transportation Needs (05/29/2023)  Utilities: Not At Risk (05/29/2023)  Alcohol Screen: Low Risk  (03/17/2021)  Depression (PHQ2-9): Low Risk  (02/12/2023)  Social Connections: Socially Integrated (05/29/2023)  Tobacco Use: Low Risk  (05/30/2023)   SDOH Interventions:     Readmission Risk Interventions     No data to display

## 2023-06-02 ENCOUNTER — Encounter (HOSPITAL_COMMUNITY): Payer: Self-pay | Admitting: Orthopedic Surgery

## 2023-06-02 ENCOUNTER — Ambulatory Visit: Payer: PPO | Admitting: Internal Medicine

## 2023-06-02 DIAGNOSIS — S72001D Fracture of unspecified part of neck of right femur, subsequent encounter for closed fracture with routine healing: Secondary | ICD-10-CM | POA: Diagnosis not present

## 2023-06-02 DIAGNOSIS — E440 Moderate protein-calorie malnutrition: Secondary | ICD-10-CM | POA: Insufficient documentation

## 2023-06-02 LAB — CBC
HCT: 30.2 % — ABNORMAL LOW (ref 36.0–46.0)
Hemoglobin: 10.1 g/dL — ABNORMAL LOW (ref 12.0–15.0)
MCH: 32 pg (ref 26.0–34.0)
MCHC: 33.4 g/dL (ref 30.0–36.0)
MCV: 95.6 fL (ref 80.0–100.0)
Platelets: 311 10*3/uL (ref 150–400)
RBC: 3.16 MIL/uL — ABNORMAL LOW (ref 3.87–5.11)
RDW: 13.5 % (ref 11.5–15.5)
WBC: 10.6 10*3/uL — ABNORMAL HIGH (ref 4.0–10.5)
nRBC: 0 % (ref 0.0–0.2)

## 2023-06-02 LAB — BASIC METABOLIC PANEL
Anion gap: 12 (ref 5–15)
BUN: 21 mg/dL (ref 8–23)
CO2: 24 mmol/L (ref 22–32)
Calcium: 9.1 mg/dL (ref 8.9–10.3)
Chloride: 99 mmol/L (ref 98–111)
Creatinine, Ser: 1 mg/dL (ref 0.44–1.00)
GFR, Estimated: 55 mL/min — ABNORMAL LOW (ref >60)
Glucose, Bld: 108 mg/dL — ABNORMAL HIGH (ref 70–99)
Potassium: 4.2 mmol/L (ref 3.5–5.1)
Sodium: 135 mmol/L (ref 135–145)

## 2023-06-02 MED ORDER — ACETAMINOPHEN 325 MG PO TABS: 650.0000 mg | ORAL_TABLET | Freq: Four times a day (QID) | ORAL | 1 refills | Status: AC | PRN

## 2023-06-02 MED ORDER — PROMETHAZINE (PHENERGAN) 6.25MG IN NS 50ML IVPB: 6.2500 mg | Freq: Four times a day (QID) | INTRAVENOUS | Status: DC | PRN

## 2023-06-02 MED ORDER — OXYCODONE HCL 5 MG PO TABS: 5.0000 mg | ORAL_TABLET | Freq: Three times a day (TID) | ORAL | 0 refills | Status: DC | PRN

## 2023-06-02 MED ORDER — ENOXAPARIN SODIUM 40 MG/0.4ML IJ SOSY: 40.0000 mg | PREFILLED_SYRINGE | INTRAMUSCULAR | 0 refills | Status: DC

## 2023-06-02 MED ORDER — HYDRALAZINE HCL 25 MG PO TABS: 25.0000 mg | ORAL_TABLET | Freq: Three times a day (TID) | ORAL | 0 refills | Status: AC

## 2023-06-02 MED ORDER — POLYETHYLENE GLYCOL 3350 17 G PO PACK: 17.0000 g | PACK | Freq: Every day | ORAL | 0 refills | Status: AC

## 2023-06-02 MED ORDER — HYDROMORPHONE HCL 1 MG/ML IJ SOLN: 0.5000 mg | Freq: Three times a day (TID) | INTRAMUSCULAR | Status: DC | PRN

## 2023-06-02 NOTE — Progress Notes (Addendum)
  RE:  [name] [name]       Date of Birth: 2035/12/10      Date:   06/02/23       To Whom It May Concern:  Please be advised that the above-named patient will require a short-term nursing home stay - anticipated 30 days or less for rehabilitation and strengthening.  The plan is for return home.                 MD signature                Date

## 2023-06-02 NOTE — TOC Progression Note (Addendum)
Transition of Care [facility]) - Progression Note    Patient Details  Name: [name] [name] MRN: [id number] Date of Birth: 05/30/1935  Transition of Care [facility]) CM/SW Contact  Lorri Frederick, LCSW Phone Number: 06/02/2023, 11:55 AM  Clinical Narrative:   Message form [facility]: now no bed available until Monday.  CSW message with [facility] and they do have available bed.  CSW spoke with pt and husband, they prefer [facility], asked if they could just wait until Monday, discussed not if another option available and they do agree to DC to [facility].    1200: message from [facility]: now they do have bed available today.  Family informed, they do want [facility].  [facility] informed.    1345: CSW spoke with Stephanie/HTA, she switched the insurance auth back to [facility] as facility choice.   Expected Discharge Plan: Skilled Nursing Facility Barriers to Discharge: Continued Medical Work up, Other (must enter comment) (family initially refusing SNF, but agreed to think abou it)  Expected Discharge Plan and Services In-house Referral: Clinical Social Work   Post Acute Care Choice:  (TBD) Living arrangements for the past 2 months: Single Family Home Expected Discharge Date: 06/02/23                                     Social Determinants of Health (SDOH) Interventions SDOH Screenings   Food Insecurity: No Food Insecurity (05/29/2023)  Housing: Low Risk  (05/29/2023)  Transportation Needs: No Transportation Needs (05/29/2023)  Utilities: Not At Risk (05/29/2023)  Alcohol Screen: Low Risk  (03/17/2021)  Depression (PHQ2-9): Low Risk  (02/12/2023)  Social Connections: Socially Integrated (05/29/2023)  Tobacco Use: Low Risk  (05/30/2023)    Readmission Risk Interventions     No data to display

## 2023-06-02 NOTE — TOC Progression Note (Signed)
Transition of Care [facility]) - Progression Note    Patient Details  Name: [name] [name] MRN: [id number] Date of Birth: 1936-02-23  Transition [facility] Care [facility]) CM/SW Contact  Epifanio Lesches, RN Phone Number: 06/02/2023, 12:59 PM  Clinical Narrative:    NCM received call from CMA informing NCM pt has appealed herd/c. NCM spoke with pt and husband to confirm. Pt's husband states they did appeal d/c ,however, they are  going to cancel appeal and hoping to d/c to [facility] SNF/rehab today. TOC team will continue to monitor and assist with needs....   Expected Discharge Plan: Skilled Nursing Facility Barriers to Discharge: Continued Medical Work up, Other (must enter comment) (family initially refusing SNF, but agreed to think abou it)  Expected Discharge Plan and Services In-house Referral: Clinical Social Work   Post Acute Care Choice:  (TBD) Living arrangements for the past 2 months: Single Family Home Expected Discharge Date: 06/02/23                                     Social Determinants of Health (SDOH) Interventions SDOH Screenings   Food Insecurity: No Food Insecurity (05/29/2023)  Housing: Low Risk  (05/29/2023)  Transportation Needs: No Transportation Needs (05/29/2023)  Utilities: Not At Risk (05/29/2023)  Alcohol Screen: Low Risk  (03/17/2021)  Depression (PHQ2-9): Low Risk  (02/12/2023)  Social Connections: Socially Integrated (05/29/2023)  Tobacco Use: Low Risk  (05/30/2023)    Readmission Risk Interventions     No data to display

## 2023-06-02 NOTE — Discharge Summary (Signed)
Physician Discharge Summary  [name] [name] ZOX:[id number] DOB: Oct 02, 1935 DOA: 05/29/2023  PCP: Lucky Cowboy, MD  Admit date: 05/29/2023 Discharge date: 06/02/2023  Disposition: Skilled nursing facility  Recommendations for Outpatient Follow-up:  Follow up with PCP in 1 week Follow up with orthopedic surgery  Discharge Condition: Stable CODE STATUS: Full code Diet recommendation: Regular diet  Brief/Interim Summary: [name] [name] is an 88 y.o. female with medical history significant for hypertension, depression, CKD stage III, hiatal hernia, CAD , she was evaluated in the ER for mechanical fall at home on 1/14, x-rays then were unremarkable, 1/20 morning, she got out of bed went to reach for her walker, missed it and fell to the floor, back in the ER, x-rays noted right hip fracture, orthopedics consulted.  Patient underwent IMN 05/30/2023.   Discharge Diagnoses:   Principal Problem:   Closed right hip fracture [facility]) Active Problems:   Essential hypertension   Gastroesophageal reflux disease   Leukocytosis   Right subtrochanteric femur fracture -Status post IMN 05/30/2023 -PT OT rec SNF  -Weightbearing as tolerated right lower extremity -Lovenox for DVT prophylaxis x 28 days    Leukocytosis -Reactive, in setting of fracture -Monitor, improved   Anxiety -BuSpar   Hypertension -Propranolol, Norvasc, Hydralazine    GERD, hiatal hernia -Protonix, Carafate   Peripheral neuropathy -Neurontin   Hyponatremia -Resolved after IV fluid  Discharge Instructions  Discharge Instructions     Diet general   Complete by: As directed    Discharge wound care:   Complete by: As directed    Ok to leave wounds open to air once there is no drainage    Cover with 4x4 gauze and tape or mepilex  Ok to clean with soap and water once drainage stops   Increase activity slowly   Complete by: As directed       Allergies as of 06/02/2023       Reactions   Ace Inhibitors Cough    Per Dr Sherene Sires pulmonology 2013   Aspartame Diarrhea, Nausea And Vomiting   Atorvastatin Other (See Comments)   Muscle aches   Biaxin [clarithromycin] Other (See Comments)   Unknown reaction : PATIENT CAN TOLERATE Z-PAK.  Is written on patient's paper chart.   Daliresp [roflumilast] Nausea And Vomiting   Erythromycin Other (See Comments)   Unknown reaction: PATIENT CAN TOLERATE Z-PAK.  It is written on patient's paper chart.   Levofloxacin Nausea And Vomiting        Medication List     STOP taking these medications    azithromycin 250 MG tablet Commonly known as: Zithromax   dexamethasone 4 MG tablet Commonly known as: DECADRON   traMADol 50 MG tablet Commonly known as: ULTRAM       TAKE these medications    acetaminophen 325 MG tablet Commonly known as: TYLENOL Take 2 tablets (650 mg total) by mouth every 6 (six) hours as needed for mild pain (pain score 1-3) or moderate pain (pain score 4-6) (or Fever >/= 101).   albuterol (2.5 MG/3ML) 0.083% nebulizer solution Commonly known as: PROVENTIL Take 3 mLs (2.5 mg total) by nebulization every 4 (four) hours as needed for wheezing or shortness of breath.   albuterol 108 (90 Base) MCG/ACT inhaler Commonly known as: VENTOLIN HFA INHALE 2 PUFFS BY MOUTH EVERY 4 HOURS AS NEEDED FOR WHEEZING FOR SHORTNESS OF BREATH   amLODipine 10 MG tablet Commonly known as: NORVASC Take  1 tablet Daikly for BP & Heart                                                                        /  TAKE                                         BY                                                 MOUTH   aspirin EC 81 MG tablet Take 81 mg by mouth at bedtime. Every other night   benzonatate 100 MG capsule Commonly known as: Tessalon Perles Take 1 capsule (100 mg total) by mouth every 6 (six) hours as needed for cough.   budesonide 0.25 MG/2ML nebulizer solution Commonly known as:  Pulmicort Take 2 mLs (0.25 mg total) by nebulization 4 (four) times daily.   busPIRone 10 MG tablet Commonly known as: BUSPAR Take 1/2 to 1 tablet 3 x /day if Needed for Anxiety or Panic Attack  (( Replaces Xanax ))   cyanocobalamin 1000 MCG tablet Commonly known as: VITAMIN B12 Take 5,000 mcg by mouth daily.   enoxaparin 40 MG/0.4ML injection Commonly known as: LOVENOX Inject 0.4 mLs (40 mg total) into the skin daily for 28 days.   gabapentin 600 MG tablet Commonly known as: Neurontin Take  1/2 to 1 tablet  3 to 4 x /Daily  as needed for Pain   hydrALAZINE 25 MG tablet Commonly known as: APRESOLINE Take 1 tablet (25 mg total) by mouth 3 (three) times daily.   ketoconazole 2 % cream Commonly known as: NIZORAL APPLY TO RINGWORM  RASH TWICE A DAY   Magnesium 400 MG Tabs Take 400 mg by mouth daily at 2 PM.   melatonin 5 MG Tabs Take 5 mg by mouth at bedtime as needed.   mometasone-formoterol 200-5 MCG/ACT Aero Commonly known as: DULERA Take 2 puffs first thing in am and then another 2 puffs about 12 hours later.   nitroGLYCERIN 0.4 MG SL tablet Commonly known as: NITROSTAT Place 1 tablet (0.4 mg total) under the tongue every 5 (five) minutes as needed for chest pain.   oxyCODONE 5 MG immediate release tablet Commonly known as: Oxy IR/ROXICODONE Take 1-2 tablets (5-10 mg total) by mouth every 8 (eight) hours as needed for severe pain (pain score 7-10).   pantoprazole 40 MG tablet Commonly known as: Protonix Take  1 tablet  Daily  to Prevent Heartburn &  Indigestion   polyethylene glycol 17 g packet Commonly known as: MIRALAX / GLYCOLAX Take 17 g by mouth daily. Start taking on: June 03, 2023   promethazine 25 MG tablet Commonly known as: PHENERGAN Take  1 tablet   4 x / day  before Meals & Bedtime  as needed for Nausea   propranolol 20 MG tablet Commonly known as: INDERAL Take 1 tablet (20 mg total) by mouth 2 (two) times daily.   rOPINIRole 2 MG  tablet Commonly known as: REQUIP Take 0.5 tablets (1 mg total) by mouth at bedtime.   senna-docusate 8.6-50 MG tablet Commonly known as: Senokot-S Take 2 tablets by mouth 2 (two) times daily.   solifenacin 5 MG tablet Commonly known as: VESICARE Take 5 mg by mouth daily.   sucralfate 1 g tablet Commonly known as: CARAFATE Take 1 g by mouth 4 (four) times daily -  with meals and at bedtime.   Vitamin D3  125 MCG (5000 UT) Caps Take 5,000 Units by mouth 2 (two) times daily.               Discharge Care Instructions  (From admission, onward)           Start     Ordered   06/02/23 0000  Discharge wound care:       Comments: Ok to leave wounds open to air once there is no drainage    Cover with 4x4 gauze and tape or mepilex  Ok to clean with soap and water once drainage stops   06/02/23 1148            Allergies  Allergen Reactions   Ace Inhibitors Cough    Per Dr Sherene Sires pulmonology 2013   Aspartame Diarrhea and Nausea And Vomiting   Atorvastatin Other (See Comments)    Muscle aches   Biaxin [Clarithromycin] Other (See Comments)    Unknown reaction : PATIENT CAN TOLERATE Z-PAK.  Is written on patient's paper chart.   Daliresp [Roflumilast] Nausea And Vomiting   Erythromycin Other (See Comments)    Unknown reaction: PATIENT CAN TOLERATE Z-PAK.  It is written on patient's paper chart.   Levofloxacin Nausea And Vomiting    Procedures/Studies: DG FEMUR PORT, MIN 2 VIEWS RIGHT Result Date: 05/30/2023 CLINICAL DATA:  Postoperative right femur EXAM: RIGHT FEMUR PORTABLE 2 VIEW COMPARISON:  Right hip and femur radiographs 05/29/2023 FINDINGS: Interval long cephalomedullary nail fixation of the previously seen intertrochanteric fracture of the proximal right femur. There are two oblique screws now fixating a proximal nail, extending into the right femoral neck. There are two distal transverse interlocking screws. No evidence of hardware failure. Persistent fracture line  lucency. Mild right femoroacetabular osteoarthritis. Mild osteoarthritis of the medial and lateral compartment of the knee. Moderate to high-grade atherosclerotic calcifications. No knee joint effusion. IMPRESSION: Interval long cephalomedullary nail fixation of the previously seen intertrochanteric fracture of the proximal right femur. No evidence of hardware failure. Electronically Signed   By: Neita Garnet M.D.   On: 05/30/2023 17:47   DG FEMUR, MIN 2 VIEWS RIGHT Result Date: 05/30/2023 CLINICAL DATA:  Elective surgery. Intramedullary nail fixation right femur. EXAM: RIGHT FEMUR 2 VIEWS COMPARISON:  Right femur radiographs 05/29/2023 FINDINGS: Images were performed intraoperatively without the presence of a radiologist. The patient is undergoing long cephalomedullary nail fixation of the previously seen intertrochanteric fracture of the proximal right femur. Near anatomic alignment. No hardware complication is seen. Total fluoroscopy images: 4 Total fluoroscopy time: 94 seconds Total dose: Radiation Exposure Index (as provided by the fluoroscopic device): 19.42 mGy air Kerma Please see intraoperative findings for further detail. IMPRESSION: Intraoperative fluoroscopy for intramedullary nail fixation of the proximal right femur. Electronically Signed   By: Neita Garnet M.D.   On: 05/30/2023 17:45   DG C-Arm 1-60 Min-No Report Result Date: 05/30/2023 Fluoroscopy was utilized by the requesting physician.  No radiographic interpretation.   DG C-Arm 1-60 Min-No Report Result Date: 05/30/2023 Fluoroscopy was utilized by the requesting physician.  No radiographic interpretation.   DG Hip Unilat W or Wo Pelvis 2-3 Views Right Result Date: 05/29/2023 CLINICAL DATA:  Right hip deformity status post fall. EXAM: DG HIP (WITH OR WITHOUT PELVIS) 2-3V RIGHT; RIGHT FEMUR 2 VIEWS COMPARISON:  Right hip radiographs dated May 23, 2023. CT abdomen/pelvis dated August 05, 2022. FINDINGS: Acute impacted and displaced  intertrochanteric fracture of the proximal right femur. There is approximately 9 mm of lateral displacement of the distal  segment. The right femoral head is seated within the acetabulum. Remote left superior and inferior pubic rami fractures. Remote right inferior pubic ramus fracture. Postoperative changes of the visualized lumbar spine with prior vertebral augmentation. The sacroiliac joints and pubic symphysis appear anatomically aligned. The left hip joint is anatomically aligned. The knee joint is anatomically aligned with chondrocalcinosis in the medial and lateral femorotibial compartments. IMPRESSION: Acute displaced and foreshortened intertrochanteric fracture of the proximal right femur. Electronically Signed   By: Hart Robinsons M.D.   On: 05/29/2023 14:06   DG FEMUR, MIN 2 VIEWS RIGHT Result Date: 05/29/2023 CLINICAL DATA:  Right hip deformity status post fall. EXAM: DG HIP (WITH OR WITHOUT PELVIS) 2-3V RIGHT; RIGHT FEMUR 2 VIEWS COMPARISON:  Right hip radiographs dated May 23, 2023. CT abdomen/pelvis dated August 05, 2022. FINDINGS: Acute impacted and displaced intertrochanteric fracture of the proximal right femur. There is approximately 9 mm of lateral displacement of the distal segment. The right femoral head is seated within the acetabulum. Remote left superior and inferior pubic rami fractures. Remote right inferior pubic ramus fracture. Postoperative changes of the visualized lumbar spine with prior vertebral augmentation. The sacroiliac joints and pubic symphysis appear anatomically aligned. The left hip joint is anatomically aligned. The knee joint is anatomically aligned with chondrocalcinosis in the medial and lateral femorotibial compartments. IMPRESSION: Acute displaced and foreshortened intertrochanteric fracture of the proximal right femur. Electronically Signed   By: Hart Robinsons M.D.   On: 05/29/2023 14:06   DG Hand Complete Right Result Date: 05/23/2023 CLINICAL DATA:   Fall, right hand pain EXAM: RIGHT HAND - COMPLETE 3+ VIEW COMPARISON:  None Available. FINDINGS: Mildly displaced oblique 4th metacarpal shaft fracture. Mild degenerative changes of the second and 3rd DIP joints. Chondrocalcinosis in the TFCC. Mild dorsal soft tissue swelling. IMPRESSION: Mildly displaced oblique 4th metacarpal shaft fracture, as above. Electronically Signed   By: Charline Bills M.D.   On: 05/23/2023 20:59   DG Hip Unilat W or Wo Pelvis 2-3 Views Right Result Date: 05/23/2023 CLINICAL DATA:  Fall, right hip pain EXAM: DG HIP (WITH OR WITHOUT PELVIS) 2-3V RIGHT COMPARISON:  None Available. FINDINGS: No fracture or dislocation is seen. Bilateral hip joint spaces are preserved. Suspected old left pelvic ring fractures. Visualized bony pelvis is otherwise intact. IMPRESSION: Negative. Electronically Signed   By: Charline Bills M.D.   On: 05/23/2023 20:58   DG Lumbar Spine Complete Result Date: 05/23/2023 CLINICAL DATA:  Fall, back pain EXAM: LUMBAR SPINE - COMPLETE 4+ VIEW COMPARISON:  None Available. FINDINGS: Five lumbar-type vertebral bodies. Status post PLIF at L1-3. Multilevel vertebral augmentation at T9-10, T11-12, and L2-4. No evidence of acute fracture or dislocation. Mild multilevel degenerative changes. Visualized bony pelvis appears intact. IMPRESSION: No evidence of acute fracture or dislocation. Multilevel postsurgical/postprocedural changes, as above. Electronically Signed   By: Charline Bills M.D.   On: 05/23/2023 20:57       Discharge Exam: Vitals:   06/02/23 0735 06/02/23 0755  BP: (!) 145/73   Pulse: 82   Resp: 17   Temp: 98.9 F (37.2 C)   SpO2: 95% 98%    General: Pt is alert, awake, not in acute distress Cardiovascular: RRR, S1/S2 +, no edema Respiratory: CTA bilaterally, no wheezing, no rhonchi, no respiratory distress, no conversational dyspnea  Abdominal: Soft, NT, ND, bowel sounds + Extremities: no edema, no cyanosis Psych: Normal mood and  affect, stable judgement and insight     The results of significant diagnostics from  this hospitalization (including imaging, microbiology, ancillary and laboratory) are listed below for reference.     Microbiology: Recent Results (from the past 240 hours)  Surgical PCR screen     Status: None   Collection Time: 05/29/23  6:35 PM   Specimen: Nasal Mucosa; Nasal Swab  Result Value Ref Range Status   MRSA, PCR NEGATIVE NEGATIVE Final   Staphylococcus aureus NEGATIVE NEGATIVE Final    Comment: (NOTE) The Xpert SA Assay (FDA approved for NASAL specimens in patients 93 years of age and older), is one component of a comprehensive surveillance program. It is not intended to diagnose infection nor to guide or monitor treatment. Performed at [facility], [address], [place], [place] [address]      Labs: BNP (last 3 results) No results for input(s): "BNP" in the last 8760 hours. Basic Metabolic Panel: Recent Labs  Lab 05/29/23 0940 05/30/23 0519 05/31/23 0530 06/01/23 0602 06/02/23 0620  NA 138 137 133* 128* 135  K 3.9 4.1 4.4 3.9 4.2  CL 104 103 96* 95* 99  CO2 22 26 26 23 24   GLUCOSE 139* 129* 149* 131* 108*  BUN 19 15 16 20 21   CREATININE 0.59 0.67 0.77 0.70 1.00  CALCIUM 9.2 8.8* 9.3 8.6* 9.1   Liver Function Tests: No results for input(s): "AST", "ALT", "ALKPHOS", "BILITOT", "PROT", "ALBUMIN" in the last 168 hours. No results for input(s): "LIPASE", "AMYLASE" in the last 168 hours. No results for input(s): "AMMONIA" in the last 168 hours. CBC: Recent Labs  Lab 05/29/23 0940 05/30/23 0519 05/31/23 0530 06/01/23 0602 06/02/23 0620  WBC 16.1* 11.8* 15.1* 15.1* 10.6*  NEUTROABS 13.2*  --   --   --   --   HGB 12.9 12.6 12.0 11.4* 10.1*  HCT 40.7 38.3 36.4 34.5* 30.2*  MCV 96.9 97.2 96.3 96.1 95.6  PLT 272 272 314 294 311   Cardiac Enzymes: No results for input(s): "CKTOTAL", "CKMB", "CKMBINDEX", "TROPONINI" in the last 168 hours. BNP: Invalid  input(s): "POCBNP" CBG: No results for input(s): "GLUCAP" in the last 168 hours. D-Dimer No results for input(s): "DDIMER" in the last 72 hours. Hgb A1c No results for input(s): "HGBA1C" in the last 72 hours. Lipid Profile No results for input(s): "CHOL", "HDL", "LDLCALC", "TRIG", "CHOLHDL", "LDLDIRECT" in the last 72 hours. Thyroid function studies No results for input(s): "TSH", "T4TOTAL", "T3FREE", "THYROIDAB" in the last 72 hours.  Invalid input(s): "FREET3" Anemia work up No results for input(s): "VITAMINB12", "FOLATE", "FERRITIN", "TIBC", "IRON", "RETICCTPCT" in the last 72 hours. Urinalysis    Component Value Date/Time   COLORURINE YELLOW 10/31/2022 1055   APPEARANCEUR CLOUDY (A) 10/31/2022 1055   LABSPEC 1.021 10/31/2022 1055   PHURINE 6.0 10/31/2022 1055   GLUCOSEU NEGATIVE 10/31/2022 1055   HGBUR NEGATIVE 10/31/2022 1055   BILIRUBINUR NEGATIVE 12/20/2019 2052   KETONESUR TRACE (A) 10/31/2022 1055   PROTEINUR TRACE (A) 10/31/2022 1055   UROBILINOGEN 0.2 09/19/2014 2235   NITRITE NEGATIVE 10/31/2022 1055   LEUKOCYTESUR NEGATIVE 10/31/2022 1055   Sepsis Labs Recent Labs  Lab 05/30/23 0519 05/31/23 0530 06/01/23 0602 06/02/23 0620  WBC 11.8* 15.1* 15.1* 10.6*   Microbiology Recent Results (from the past 240 hours)  Surgical PCR screen     Status: None   Collection Time: 05/29/23  6:35 PM   Specimen: Nasal Mucosa; Nasal Swab  Result Value Ref Range Status   MRSA, PCR NEGATIVE NEGATIVE Final   Staphylococcus aureus NEGATIVE NEGATIVE Final    Comment: (NOTE)  The Xpert SA Assay (FDA approved for NASAL specimens in patients 72 years of age and older), is one component of a comprehensive surveillance program. It is not intended to diagnose infection nor to guide or monitor treatment. Performed at [facility], [address], [place], [place] [address]      Patient was seen and examined on the day of discharge and was found to be in stable condition.  Time coordinating discharge: 35 minutes including assessment and coordination of care, as well as examination of the patient.   SIGNED:  Noralee Stain, DO [facility] 06/02/2023, 11:48 AM

## 2023-06-02 NOTE — Progress Notes (Signed)
Initial Nutrition Assessment  DOCUMENTATION CODES:   Non-severe (moderate) malnutrition in context of chronic illness  INTERVENTION:  -Continue Ensure Enlive po BID, each supplement provides 350 kcal and 20 grams of protein. -Magic cup BID with meals, each supplement provides 290 kcal and 9 grams of protein -Provided brief education on protein sources  NUTRITION DIAGNOSIS:   Moderate Malnutrition related to chronic illness (CKD stage III) as evidenced by moderate fat depletion, moderate muscle depletion.  GOAL:   Patient will meet greater than or equal to 90% of their needs   MONITOR:   Supplement acceptance, Weight trends  REASON FOR ASSESSMENT:   Consult Assessment of nutrition requirement/status  ASSESSMENT:   Pt admitted for closed right hip fracture. Pt with PMH of HTN, depression, CKD stage III, hiatal hernia, CAD.   1/21: IMN of right hip  Pt's husband at bedside during visit. Pt reports having a good appetite prior to admission and during hospital stay. Pt's husband reports he does not prefer for her to be cooking because he does not think it is safe for her due to her mobility. Pt's husband reports doing "light cooking" and buying more packaged/convenience foods. Pt's food recall is B: eggs and/or toast, L: soup or sandwich, D: Soup, salad, or Captain D's, S: nabs or crackers. Pt reports having Cheerwine zero multiple times a day and has Ensure's once a day. Pt reports loving the taste of Ensure's and wants to keep receiving them here. Pt reports taking a MVI, Vitamin C/D3/B12, and calcium at home.   Pt reports no recent wt loss in the past year and reports a usual body weight of 147 lbs. Per chart review pt has stayed between 145-147 lbs since 05/24.  Pt reports not getting around well at home and needing assistance getting out of bed. Pt uses a walker.  Meds: Colace 100 mg, Ensure Enlive BID, Miralax daily  Labs: Glucose 108-149, A1C 6.0 (06/24)   NUTRITION -  FOCUSED PHYSICAL EXAM:  Flowsheet Row Most Recent Value  Orbital Region No depletion  Upper Arm Region Moderate depletion  Thoracic and Lumbar Region No depletion  Buccal Region Moderate depletion  Temple Region Moderate depletion  Clavicle and Acromion Bone Region Mild depletion  Scapular Bone Region Moderate depletion  Dorsal Hand Unable to assess  [IV covering muscle/bruising]  Patellar Region Moderate depletion  Anterior Thigh Region Moderate depletion  Posterior Calf Region Moderate depletion  Edema (RD Assessment) None  Hair Reviewed  Eyes Reviewed  Mouth Reviewed  Skin Reviewed  [ecchymosis on left arm]  Nails Reviewed       Diet Order:   Diet Order             Diet general           Diet regular Room service appropriate? Yes; Fluid consistency: Thin  Diet effective now                   EDUCATION NEEDS:   Education needs have been addressed  Skin:  Skin Assessment: Reviewed RN Assessment  Last BM:  1/20  Height:   Ht Readings from Last 1 Encounters:  05/30/23 5\' 1"  (1.549 m)    Weight:   Wt Readings from Last 1 Encounters:  05/30/23 66.7 kg    Ideal Body Weight:  47.7 kg  BMI:  Body mass index is 27.78 kg/m.  Estimated Nutritional Needs:   Kcal:  1500-1700  Protein:  75-90 g  Fluid:  > 1.5 L  Maceo Pro, MS Dietetic Intern

## 2023-06-02 NOTE — Progress Notes (Signed)
  PROGRESS NOTE  Patient does not want to discharge today citing nausea.  Added Phenergan today.  Continue bowel regimen.  Hopeful discharge to skilled nursing facility tomorrow.   Noralee Stain, DO [facility] 06/02/2023, 2:59 PM  Available via Epic secure chat 7am-7pm After these hours, please refer to coverage provider listed on [email or web address]

## 2023-06-02 NOTE — Progress Notes (Addendum)
Occupational Therapy Treatment Patient Details Name: [name] [name] MRN: [id number] DOB: 05-Feb-1936 Today's Date: 06/02/2023   History of present illness Pt is a 88 y/o female presenting on 1/20 after fall. Found with R hip fx, 1/21 S/P IMN, WBAT with walker. PMH includes: anxiety, CAD, chronic back pain, CKD, hiatal hernia, HTN, prior back surgery.   OT comments  Pt supine in bed and agreeable to OT.  Reports not feeling well today, but willing to try.  Completing bed mobility with max assist, sitting EOB to engage in ADLs with min guard to setup assist for grooming and total assist for LB dressing.  Reports feeling lightheaded at EOB with BP assessed at 117/75.  Scooting towards HOB with max assist. Assisted back to supine with max assist, BP 140/66- nursing notified. Would recommend orthostatics next session.  Continue to recommend <3hrs/day inpatient setting.  Will follow acutely.        If plan is discharge home, recommend the following:  Two people to help with walking and/or transfers;A lot of help with bathing/dressing/bathroom   Equipment Recommendations  Other (comment) (defer)    Recommendations for Other Services      Precautions / Restrictions Precautions Precautions: Fall Restrictions Weight Bearing Restrictions Per Provider Order: Yes RLE Weight Bearing Per Provider Order: Weight bearing as tolerated       Mobility Bed Mobility Overal bed mobility: Needs Assistance Bed Mobility: Supine to Sit, Sit to Supine     Supine to sit: Max assist, HOB elevated Sit to supine: Max assist, HOB elevated   General bed mobility comments: assist for R LE, trunk and scooting forward. Increased time and bed pad utilized for transition to full sitting.    Transfers Overall transfer level: Needs assistance   Transfers: Bed to chair/wheelchair/BSC            Lateral/Scoot Transfers: Max assist General transfer comment: pt with nausea and feeling "unwell", requires max  assist to scoot towards HOB     Balance Overall balance assessment: Needs assistance Sitting-balance support: No upper extremity supported, Feet supported Sitting balance-Leahy Scale: Fair Sitting balance - Comments: limited dynamically   Standing balance support: Bilateral upper extremity supported, During functional activity Standing balance-Leahy Scale: Poor Standing balance comment: relies on BUE and external support                           ADL either performed or assessed with clinical judgement   ADL Overall ADL's : Needs assistance/impaired     Grooming: Sitting;Set up Grooming Details (indicate cue type and reason): brushing teeth, increased time at EOB         Upper Body Dressing : Contact guard assist;Sitting   Lower Body Dressing: Total assistance;Sitting/lateral leans               Functional mobility during ADLs: Maximal assistance General ADL Comments: max assist lateral scoots towards [facility]    Extremity/Trunk Assessment              Vision       Perception     Praxis      Cognition Arousal: Alert Behavior During Therapy: Anxious Overall Cognitive Status: Within Functional Limits for tasks assessed                                 General Comments: pt internally distracted by pain and nausea, but overall  appears WFL.  Some slow processing but not formally assessed        Exercises      Shoulder Instructions       General Comments spouse at side and supportive, dressing intact to R LE.  She reports lightheadedness at EOB:  BP assessed EOB 117/75 and supine 140/66- nursing notified    Pertinent Vitals/ Pain       Pain Assessment Pain Assessment: Faces Faces Pain Scale: Hurts even more Pain Location: R hip Pain Descriptors / Indicators: Discomfort, Operative site guarding Pain Intervention(s): Limited activity within patient's tolerance, Monitored during session, Repositioned  Home Living                                           Prior Functioning/Environment              Frequency  Min 1X/week        Progress Toward Goals  OT Goals(current goals can now be found in the care plan section)  Progress towards OT goals: Progressing toward goals  Acute Rehab OT Goals Patient Stated Goal: get better OT Goal Formulation: With patient Time For Goal Achievement: 06/14/23 Potential to Achieve Goals: Good  Plan      Co-evaluation                 AM-PAC OT "6 Clicks" Daily Activity     Outcome Measure   Help from another person eating meals?: None Help from another person taking care of personal grooming?: A Little Help from another person toileting, which includes using toliet, bedpan, or urinal?: A Lot Help from another person bathing (including washing, rinsing, drying)?: A Lot Help from another person to put on and taking off regular upper body clothing?: A Little Help from another person to put on and taking off regular lower body clothing?: A Lot 6 Click Score: 16    End of Session Equipment Utilized During Treatment: Gait belt  OT Visit Diagnosis: Other abnormalities of gait and mobility (R26.89);Muscle weakness (generalized) (M62.81);Pain Pain - Right/Left: Right Pain - part of body: Leg   Activity Tolerance Patient tolerated treatment well   Patient Left in bed;with call bell/phone within reach;with bed alarm set;with family/visitor present   Nurse Communication Mobility status        Time: 0981-1914 OT Time Calculation (min): 27 min  Charges: OT General Charges $OT Visit: 1 Visit OT Treatments $Self Care/Home Management : 23-37 mins  Barry Brunner, OT Acute Rehabilitation Services Office [phone]   Chancy Milroy 06/02/2023, 1:26 PM

## 2023-06-03 MED ORDER — BISACODYL 10 MG RE SUPP
10.0000 mg | Freq: Once | RECTAL | Status: AC
  Administered 2023-06-03: 10 mg via RECTAL
  Filled 2023-06-03: qty 1

## 2023-06-03 NOTE — Progress Notes (Signed)
  PROGRESS NOTE  Patient was able to tolerate a little bit of lunch and dinner yesterday.  Wants to try suppository as she has not had a bowel movement since admission.  Ready for discharge to skilled nursing facility today.  Noralee Stain, DO [facility] 06/03/2023, 12:23 PM  Available via Epic secure chat 7am-7pm After these hours, please refer to coverage provider listed on [email or web address]

## 2023-06-03 NOTE — Plan of Care (Signed)
Problem: Education: Goal: Knowledge of General Education information will improve Description: Including pain rating scale, medication(s)/side effects and non-pharmacologic comfort measures Outcome: Progressing   Problem: Activity: Goal: Risk for activity intolerance will decrease Outcome: Progressing

## 2023-06-03 NOTE — TOC Transition Note (Signed)
Transition of Care [facility]) - Discharge Note   Patient Details  Name: [name] [name] MRN: [id number] Date of Birth: [birth year]  [facility] CM/SW Contact:  Helene Kelp, LCSW Phone Number: 06/03/2023, 1:43 PM  Clinical Narrative:    CSW followed-up with the clinical team to address the patient's disposition (transfer to SNF [facility])  This writer made contact with SNF facility representative ([facility]) and obtained facility transfer information. CSW to coordinate patient discharge transfer to the facility.   CSW met with the patient at the bedside and updated them to their current disposition and coordination efforts.   CSW contacted the patient's natural support (spouse Gerlene Burdock and nd son Zabdi Mis) to update them to the patient's current disposition This Clinical research associate was not able to make contract with the Spouse, but did make contact with the son Clide Cliff. This Clinical research associate updated Ricky to the patient's disposition. Ricky notd he will contact his father, pt spouse and update him.   This writer followed-up the discharge process and arranged transportation by way of ([facility]) for the patient to the facility.  Trasfer date: 06/03/23 Estimated time of discharge: 330 PM  CSW updated the clinical team to the above information, and noted [facility] transport arrangements, along with the patient and natural supports.  Transfer process update: CSW contacted the pt's family/natural supports to update pt's disposition (transfer to noted SNF accepting the patient's referral) CSW arranged ambulance transport via [facility] to [facility]).   Nurse call report number [phone]) prior to discharge Room number: [id number] CSW provied the CN with discharge documentation needed for transfer to the SNF at the nrusing station.   CSW provided CN with discharge packet and information for the patient to transfer.   No other needs identified of this Clinical research associate. Please contact TOC if there is additional disposition support  needed.   Final next level of care: Skilled Nursing Facility Barriers to Discharge: Continued Medical Work up   Patient Goals and CMS Choice Patient states their goals for this hospitalization and ongoing recovery are:: gOOD AS NEW CMS Medicare.gov Compare Post Acute Care list provided to:: Patient Represenative (must comment) Choice offered to / list presented to : Patient, Spouse      Discharge Placement    Name of family member notified: Spouse -- Bethena Roys Patient and family notified of of transfer: 06/03/23  Discharge Plan and Services Additional resources added to the After Visit Summary for   In-house Referral: Clinical Social Work   Post Acute Care Choice:  (TBD)            Social Drivers of Health (SDOH) Interventions SDOH Screenings   Food Insecurity: No Food Insecurity (05/29/2023)  Housing: Low Risk  (05/29/2023)  Transportation Needs: No Transportation Needs (05/29/2023)  Utilities: Not At Risk (05/29/2023)  Alcohol Screen: Low Risk  (03/17/2021)  Depression (PHQ2-9): Low Risk  (02/12/2023)  Social Connections: Socially Integrated (05/29/2023)  Tobacco Use: Low Risk  (05/30/2023)    Readmission Risk Interventions     No data to display

## 2023-06-06 ENCOUNTER — Ambulatory Visit: Payer: PPO | Admitting: Nurse Practitioner

## 2023-06-12 ENCOUNTER — Ambulatory Visit: Payer: PPO | Admitting: Nurse Practitioner

## 2023-06-30 ENCOUNTER — Ambulatory Visit: Payer: PPO | Admitting: Family

## 2023-06-30 VITALS — BP 118/60 | HR 73 | Temp 97.7°F | Ht 61.0 in | Wt 152.0 lb

## 2023-06-30 DIAGNOSIS — S72001D Fracture of unspecified part of neck of right femur, subsequent encounter for closed fracture with routine healing: Secondary | ICD-10-CM | POA: Diagnosis not present

## 2023-06-30 DIAGNOSIS — M25551 Pain in right hip: Secondary | ICD-10-CM

## 2023-06-30 NOTE — Progress Notes (Signed)
Established Patient Office Visit  Subjective   Patient ID: [name] [name], female    DOB: [birth year]  Age: 88 y.o. MRN: [id number]  Chief Complaint  Patient presents with  . Follow-up    PT rehab follow up. She had a fall 1/21 and fractured her hip.  Patient states they are coming to her house and it has been helping. Patient wanted to know if putting Bengay or any cream kind of cream to help with her pains     HPI 88 year old female, patient of Dr. Astrid Divine presents today post rehab after sustaining a fall on May 30, 2023 where she fractured her right hip.  She reports she is doing well overall.  Continuing to undergo rehab at home.  She has orthopedic appointment next month.  Continues to have intermittent pain in her right hip that is 8 out of 10.  Reports that it comes and goes.  Takes gabapentin that helps.  Has oxycodone at home but has not been taking it because it causes nausea.  She is not interested in any medication to help with the nausea.  Would like to stick with gabapentin.  Admits to being more down recently feels like she is not able to move around as quickly as she wants was.  Has a great support system with her husband.  Also feels if she could just get her hair done she will feel better.  Review of Systems  Musculoskeletal:  Positive for joint pain.       Right hip pain  All other systems reviewed and are negative. Past Medical History:  Diagnosis Date  . Anxiety    takes Xanax daily as needed  . Asthma    Albuterol daily as needed  . Blood transfusion without reported diagnosis   . CAD (coronary artery disease)    LHC 2003 with 40% pLAD, 30% mLAD, 100% D1 (moderate vessel), 100%  D2 (small vessel), 95% mid small OM2.  Pt had PTCA to D1;  D2 and OM2 small vessels and tx medically;  Last Myoview (2012): anterior infarct seen with scar, no ischemia. EF normal. Med Rx // Myoview (12/14):  Low risk; ant scar w/ peri-infarct ischemia; EF 55% with ant AK // Myoview  5/16: EF 55-65, ant, apical scar, no ischemia   . Cataract   . Chronic back pain    HNP, DDD, spinal stenosis, vertebral compression fractures.   . Chronic nausea    takes Zofran daily as needed.  normal gastric emptying study 03/2013  . CKD (chronic kidney disease), stage III (HCC) 09/27/2017  . Constipation    felt to be functional. takes Senokot and Miralax daily.  treated with Linzess in past.   . DIVERTICULOSIS, COLON 08/28/2008   Qualifier: Diagnosis of  By: Bascom Levels, [name], [name]    . Elevated LFTs 2015   probably med induced DILI, from Augmentin vs Pravastatin  . GERD (gastroesophageal reflux disease)    hx of esophageal stricture   . Hiatal hernia    Paraesophageal hernias. s/p fundoplications in 2013 and 04/2013  . History of bronchitis several of yrs ago  . History of colon polyps 03/2009, 03/2011   adenomatous colon polyps, no high grade dysplasia.   [name] History of vertigo    no meds  . Hyperlipidemia    takes Pravastatin daily  . Hypertension    takes Amlodipine,Metoprolol,and Losartan daily  . Multiple closed pelvic fractures without disruption of pelvic circle (HCC)   . Nausea 03/2016  .  Osteoporosis   . PONV (postoperative nausea and vomiting)    yrs ago  . Pubic ramus fracture (HCC) 12/20/2019  . Slow urinary stream    takes Rapaflo daily  . TIA (transient ischemic attack) 1995   no residual effects noted  . Vertebral compression fracture (HCC) 06/23/2012    Social History   Socioeconomic History  . Marital status: Married    Spouse name: [name]  . Number of children: 2  . Years of education: 65  . Highest education level: Not on file  Occupational History  . Occupation: Retired    Associate Professor: RETIRED    Comment: worked at [employer]  . Smoking status: Never  . Smokeless tobacco: Never  Vaping Use  . Vaping status: Never Used  Substance and Sexual Activity  . Alcohol use: No  . Drug use: Never  . Sexual activity: Not Currently     Partners: Male    Birth control/protection: Post-menopausal, Surgical  Other Topics Concern  . Not on file  Social History Narrative   Lives with husband.   Right-handed.   3 cups caffeine per day.   Social Drivers of Corporate investment banker Strain: Not on file  Food Insecurity: No Food Insecurity (05/29/2023)   Hunger Vital Sign   . Worried About Programme researcher, broadcasting/film/video in the Last Year: Never true   . Ran Out of Food in the Last Year: Never true  Transportation Needs: No Transportation Needs (05/29/2023)   PRAPARE - Transportation   . Lack of Transportation (Medical): No   . Lack of Transportation (Non-Medical): No  Physical Activity: Not on file  Stress: Not on file  Social Connections: Socially Integrated (05/29/2023)   Social Connection and Isolation Panel [NHANES]   . Frequency of Communication with Friends and Family: Three times a week   . Frequency of Social Gatherings with Friends and Family: Twice a week   . Attends Religious Services: More than 4 times per year   . Active Member of Clubs or Organizations: Yes   . Attends Banker Meetings: 1 to 4 times per year   . Marital Status: Married  Catering manager Violence: Not At Risk (05/29/2023)   Humiliation, Afraid, Rape, and Kick questionnaire   . Fear of Current or Ex-Partner: No   . Emotionally Abused: No   . Physically Abused: No   . Sexually Abused: No    Past Surgical History:  Procedure Laterality Date  . APPENDECTOMY     patient unsure of date  . BACK SURGERY    . BLADDER REPAIR    . CHOLECYSTECTOMY     Patient unsure of date.  . CORONARY ANGIOPLASTY  11/16/2001   1 stent  . ESOPHAGEAL MANOMETRY N/A 03/25/2013   Procedure: ESOPHAGEAL MANOMETRY (EM);  Surgeon: Louis Meckel, MD;  Location: [facility];  Service: Endoscopy;  Laterality: N/A;  . ESOPHAGOGASTRODUODENOSCOPY N/A 03/15/2013   Procedure: ESOPHAGOGASTRODUODENOSCOPY (EGD);  Surgeon: Beverley Fiedler, MD;  Location: [facility] ENDOSCOPY;   Service: Endoscopy;  Laterality: N/A;  . ESOPHAGOGASTRODUODENOSCOPY N/A 12/25/2015   Procedure: ESOPHAGOGASTRODUODENOSCOPY (EGD);  Surgeon: Sherrilyn Rist, MD;  Location: [facility] ENDOSCOPY;  Service: Endoscopy;  Laterality: N/A;  . EYE SURGERY  11/2000   bilateral cataracts with lens implant  . FIXATION KYPHOPLASTY LUMBAR SPINE  X 2   "L3-4"  . HEMORRHOID SURGERY    . HIATAL HERNIA REPAIR  06/03/2011   Procedure: LAPAROSCOPIC REPAIR OF HIATAL HERNIA;  Surgeon:  Ernestene Mention, MD;  Location: [facility] ORS;  Service: General;  Laterality: N/A;  . HIATAL HERNIA REPAIR N/A 04/24/2013   Procedure: LAPAROSCOPIC  REPAIR RECURRENT PARASOPHAGEAL HIATAL HERNIA WITH FUNDOPLICATION;  Surgeon: Ardeth Sportsman, MD;  Location: [facility] ORS;  Service: General;  Laterality: N/A;  . INSERTION OF MESH N/A 04/24/2013   Procedure: INSERTION OF MESH;  Surgeon: Ardeth Sportsman, MD;  Location: [facility] ORS;  Service: General;  Laterality: N/A;  . INTRAMEDULLARY (IM) NAIL INTERTROCHANTERIC Right 05/30/2023   Procedure: INTRAMEDULLARY (IM) NAIL INTERTROCHANTERIC;  Surgeon: Myrene Galas, MD;  Location: [facility] OR;  Service: Orthopedics;  Laterality: Right;  . IR KYPHO THORACIC WITH BONE BIOPSY  09/29/2017  . IR KYPHO THORACIC WITH BONE BIOPSY  11/03/2017  . IR KYPHO THORACIC WITH BONE BIOPSY  02/14/2019  . IR KYPHO THORACIC WITH BONE BIOPSY  04/02/2019  . LAPAROSCOPIC LYSIS OF ADHESIONS N/A 04/24/2013   Procedure: LAPAROSCOPIC LYSIS OF ADHESIONS;  Surgeon: Ardeth Sportsman, MD;  Location: [facility] ORS;  Service: General;  Laterality: N/A;  . LAPAROSCOPIC NISSEN FUNDOPLICATION  06/03/2011   Procedure: LAPAROSCOPIC NISSEN FUNDOPLICATION;  Surgeon: Ernestene Mention, MD;  Location: [facility] ORS;  Service: General;  Laterality: N/A;  . LUMBAR FUSION  12/07/2015   Right L2-3 facetectomy, posterolateral fusion L2-3 with pedicle screws, rods, local bone graft, Vivigen cancellous chips. Fusion extended to the L1 level with pedicle screws and rods  . LUMBAR  LAMINECTOMY/DECOMPRESSION MICRODISCECTOMY Right 06/26/2012   Procedure: LUMBAR LAMINECTOMY/DECOMPRESSION MICRODISCECTOMY;  Surgeon: Kerrin Champagne, MD;  Location: [facility] OR;  Service: Orthopedics;  Laterality: Right;  RIGHT L4-5 MICRODISCECTOMY  . LUMBAR LAMINECTOMY/DECOMPRESSION MICRODISCECTOMY N/A 05/29/2015   Procedure: RIGHT L2-3 MICRODISCECTOMY;  Surgeon: Kerrin Champagne, MD;  Location: [facility] OR;  Service: Orthopedics;  Laterality: N/A;  . TOTAL ABDOMINAL HYSTERECTOMY  1975   partial  . UPPER GASTROINTESTINAL ENDOSCOPY      Family History  Problem Relation Age of Onset  . Colon cancer Mother 75  . Heart attack Father 54       heart attack  . Brain cancer Brother        tumor   . Heart disease Brother   . Ulcers Brother   . Arthritis Brother   . Kidney disease Daughter   . Esophageal cancer Neg Hx   . Stomach cancer Neg Hx     Allergies  Allergen Reactions  . Ace Inhibitors Cough    Per Dr Sherene Sires pulmonology 2013  . Aspartame Diarrhea and Nausea And Vomiting  . Atorvastatin Other (See Comments)    Muscle aches  . Biaxin [Clarithromycin] Other (See Comments)    Unknown reaction : PATIENT CAN TOLERATE Z-PAK.  Is written on patient's paper chart.  Lenice Llamas [Roflumilast] Nausea And Vomiting  . Erythromycin Other (See Comments)    Unknown reaction: PATIENT CAN TOLERATE Z-PAK.  It is written on patient's paper chart.  . Levofloxacin Nausea And Vomiting    Current Outpatient Medications on File Prior to Visit  Medication Sig Dispense Refill  . acetaminophen (TYLENOL) 325 MG tablet Take 2 tablets (650 mg total) by mouth every 6 (six) hours as needed for mild pain (pain score 1-3) or moderate pain (pain score 4-6) (or Fever >/= 101). 60 tablet 1  . albuterol (PROVENTIL) (2.5 MG/3ML) 0.083% nebulizer solution Take 3 mLs (2.5 mg total) by nebulization every 4 (four) hours as needed for wheezing or shortness of breath. 75 mL 2  . albuterol (VENTOLIN HFA) 108 (90 Base) MCG/ACT  inhaler INHALE 2  PUFFS BY MOUTH EVERY 4 HOURS AS NEEDED FOR WHEEZING FOR SHORTNESS OF BREATH (Patient taking differently: Inhale 2 puffs into the lungs as needed for wheezing or shortness of breath.) 9 g 0  . amLODipine (NORVASC) 10 MG tablet Take  1 tablet Daikly for BP & Heart                                                                        /                                                                   TAKE                                         BY                                                 MOUTH (Patient taking differently: Take 10 mg by mouth daily.) 90 tablet 3  . aspirin EC 81 MG tablet Take 81 mg by mouth at bedtime. Every other night    . budesonide (PULMICORT) 0.25 MG/2ML nebulizer solution Take 2 mLs (0.25 mg total) by nebulization 4 (four) times daily. 60 mL 12  . busPIRone (BUSPAR) 10 MG tablet Take 1/2 to 1 tablet 3 x /day if Needed for Anxiety or Panic Attack  (( Replaces Xanax )) 90 tablet 0  . Cholecalciferol (VITAMIN D3) 5000 units CAPS Take 5,000 Units by mouth 2 (two) times daily.     [name] enoxaparin (LOVENOX) 40 MG/0.4ML injection Inject 0.4 mLs (40 mg total) into the skin daily for 28 days. 11.2 mL 0  . gabapentin (NEURONTIN) 600 MG tablet Take  1/2 to 1 tablet  3 to 4 x /Daily  as needed for Pain 360 tablet 1  . hydrALAZINE (APRESOLINE) 25 MG tablet Take 1 tablet (25 mg total) by mouth 3 (three) times daily. 90 tablet 0  . ketoconazole (NIZORAL) 2 % cream APPLY TO RINGWORM  RASH TWICE A DAY 60 g 0  . Magnesium 400 MG TABS Take 400 mg by mouth daily at 2 PM.    . melatonin 5 MG TABS Take 5 mg by mouth at bedtime as needed.    . mometasone-formoterol (DULERA) 200-5 MCG/ACT AERO Take 2 puffs first thing in am and then another 2 puffs about 12 hours later. 13 g 5  . nitroGLYCERIN (NITROSTAT) 0.4 MG SL tablet Place 1 tablet (0.4 mg total) under the tongue every 5 (five) minutes as needed for chest pain. 90 tablet 3  . pantoprazole (PROTONIX) 40 MG tablet Take  1 tablet  Daily  to Prevent  Heartburn &  Indigestion 90 tablet 3  . polyethylene glycol (MIRALAX / GLYCOLAX) 17 g packet  Take 17 g by mouth daily. 14 each 0  . promethazine (PHENERGAN) 25 MG tablet Take  1 tablet   4 x / day  before Meals & Bedtime  as needed for Nausea 360 tablet 1  . propranolol (INDERAL) 20 MG tablet Take 1 tablet (20 mg total) by mouth 2 (two) times daily. 60 tablet 11  . rOPINIRole (REQUIP) 2 MG tablet Take 0.5 tablets (1 mg total) by mouth at bedtime. 30 tablet 0  . senna-docusate (SENOKOT-S) 8.6-50 MG tablet Take 2 tablets by mouth 2 (two) times daily. 60 tablet 1  . solifenacin (VESICARE) 5 MG tablet Take 5 mg by mouth daily.    . sucralfate (CARAFATE) 1 g tablet Take 1 g by mouth 4 (four) times daily -  with meals and at bedtime.    . vitamin B-12 (CYANOCOBALAMIN) 1000 MCG tablet Take 5,000 mcg by mouth daily.    . benzonatate (TESSALON PERLES) 100 MG capsule Take 1 capsule (100 mg total) by mouth every 6 (six) hours as needed for cough. (Patient not taking: Reported on 06/30/2023) 30 capsule 1  . oxyCODONE (OXY IR/ROXICODONE) 5 MG immediate release tablet Take 1-2 tablets (5-10 mg total) by mouth every 8 (eight) hours as needed for severe pain (pain score 7-10). (Patient not taking: Reported on 06/30/2023) 30 tablet 0   No current facility-administered medications on file prior to visit.    BP 118/60 (BP Location: Left Arm, Patient Position: Sitting, Cuff Size: Normal)   Pulse 73   Temp 97.7 F (36.5 C) (Oral)   Ht 5\' 1"  (1.549 m)   Wt 152 lb (68.9 kg)   SpO2 96%   BMI 28.72 kg/m chart    Objective:     BP 118/60 (BP Location: Left Arm, Patient Position: Sitting, Cuff Size: Normal)   Pulse 73   Temp 97.7 F (36.5 C) (Oral)   Ht 5\' 1"  (1.549 m)   Wt 152 lb (68.9 kg)   SpO2 96%   BMI 28.72 kg/m    Physical Exam Vitals reviewed. Nursing note reviewed: Accompanied by her husband. Constitutional:      Appearance: Normal appearance. She is normal weight.  Cardiovascular:     Rate  and Rhythm: Normal rate and regular rhythm.  Pulmonary:     Effort: Pulmonary effort is normal.     Breath sounds: Normal breath sounds.  Abdominal:     General: Abdomen is flat. Bowel sounds are normal.     Palpations: Abdomen is soft.  Musculoskeletal:        General: Tenderness present. Normal range of motion.     Comments: Ambulating very well with a walker.  Right hip tenderness to palpation.  No redness or swelling.  Incision well-healed.  Skin:    General: Skin is warm and dry.     Comments: Incision on the right hip is well-healed.  Edges well-approximated.  No drainage or discharge.  Neurological:     General: No focal deficit present.     Mental Status: She is alert and oriented to person, place, and time. Mental status is at baseline.  Psychiatric:        Mood and Affect: Mood normal.        Behavior: Behavior normal.        Thought Content: Thought content normal.    No results found for any visits on 06/30/23.    The ASCVD Risk score (Arnett DK, et al., 2019) failed to calculate for the following reasons:  The 2019 ASCVD risk score is only valid for ages 22 to 58    Assessment & Plan:   Problem List Items Addressed This Visit     Closed right hip fracture (HCC) - Primary   Other Visit Diagnoses       Right hip pain         Patient already has an appointment to establish with Moshe Cipro next month.  She has orthopedic follow-up next month as well.  Continue home rehab.  Continue gabapentin as needed for pain.  Let me know if she would like something for nausea to tolerate the oxycodone if pain increases in severity.  Encourage patient to do things that make her happy like getting her hair done.  No follow-ups on file.    Eulis Foster, FNP

## 2023-07-10 ENCOUNTER — Ambulatory Visit (INDEPENDENT_AMBULATORY_CARE_PROVIDER_SITE_OTHER): Payer: PPO | Admitting: Family Medicine

## 2023-07-10 ENCOUNTER — Encounter: Payer: Self-pay | Admitting: Family Medicine

## 2023-07-10 VITALS — BP 120/80 | HR 67 | Temp 98.0°F | Ht 61.0 in | Wt 149.0 lb

## 2023-07-10 DIAGNOSIS — F3341 Major depressive disorder, recurrent, in partial remission: Secondary | ICD-10-CM

## 2023-07-10 DIAGNOSIS — D472 Monoclonal gammopathy: Secondary | ICD-10-CM

## 2023-07-10 DIAGNOSIS — I10 Essential (primary) hypertension: Secondary | ICD-10-CM | POA: Diagnosis not present

## 2023-07-10 DIAGNOSIS — M48062 Spinal stenosis, lumbar region with neurogenic claudication: Secondary | ICD-10-CM

## 2023-07-10 DIAGNOSIS — G894 Chronic pain syndrome: Secondary | ICD-10-CM

## 2023-07-10 DIAGNOSIS — R5383 Other fatigue: Secondary | ICD-10-CM

## 2023-07-10 DIAGNOSIS — M25551 Pain in right hip: Secondary | ICD-10-CM

## 2023-07-10 LAB — CBC WITH DIFFERENTIAL/PLATELET
Basophils Absolute: 0.1 10*3/uL (ref 0.0–0.1)
Basophils Relative: 0.9 % (ref 0.0–3.0)
Eosinophils Absolute: 0.2 10*3/uL (ref 0.0–0.7)
Eosinophils Relative: 2.2 % (ref 0.0–5.0)
HCT: 40.5 % (ref 36.0–46.0)
Hemoglobin: 13.2 g/dL (ref 12.0–15.0)
Lymphocytes Relative: 26.2 % (ref 12.0–46.0)
Lymphs Abs: 2.3 10*3/uL (ref 0.7–4.0)
MCHC: 32.5 g/dL (ref 30.0–36.0)
MCV: 95.5 fl (ref 78.0–100.0)
Monocytes Absolute: 0.7 10*3/uL (ref 0.1–1.0)
Monocytes Relative: 8.4 % (ref 3.0–12.0)
Neutro Abs: 5.4 10*3/uL (ref 1.4–7.7)
Neutrophils Relative %: 62.3 % (ref 43.0–77.0)
Platelets: 548 10*3/uL — ABNORMAL HIGH (ref 150.0–400.0)
RBC: 4.25 Mil/uL (ref 3.87–5.11)
RDW: 14 % (ref 11.5–15.5)
WBC: 8.8 10*3/uL (ref 4.0–10.5)

## 2023-07-10 LAB — TSH: TSH: 0.71 u[IU]/mL (ref 0.35–5.50)

## 2023-07-10 LAB — COMPREHENSIVE METABOLIC PANEL
ALT: 8 U/L (ref 0–35)
AST: 16 U/L (ref 0–37)
Albumin: 4 g/dL (ref 3.5–5.2)
Alkaline Phosphatase: 93 U/L (ref 39–117)
BUN: 18 mg/dL (ref 6–23)
CO2: 24 meq/L (ref 19–32)
Calcium: 9.9 mg/dL (ref 8.4–10.5)
Chloride: 102 meq/L (ref 96–112)
Creatinine, Ser: 0.73 mg/dL (ref 0.40–1.20)
GFR: 74.85 mL/min (ref >60.00)
Glucose, Bld: 89 mg/dL (ref 70–99)
Potassium: 4 meq/L (ref 3.5–5.1)
Sodium: 136 meq/L (ref 135–145)
Total Bilirubin: 0.3 mg/dL (ref 0.2–1.2)
Total Protein: 7.8 g/dL (ref 6.0–8.3)

## 2023-07-10 MED ORDER — TRAMADOL HCL 50 MG PO TABS: 50.0000 mg | ORAL_TABLET | Freq: Three times a day (TID) | ORAL | 0 refills | Status: DC | PRN

## 2023-07-10 NOTE — Progress Notes (Signed)
 Thyroid labs are normal. No anemia or infection noted on your CBC. Your platelets were elevated, this could be because you are healing.  Will recheck this at your next visit. Sodium is normal, potassium is normal, kidney function is stable, liver function is normal. Please print and mail labs to patient

## 2023-07-10 NOTE — Patient Instructions (Addendum)
 Welcome to [facility]!  Will send in tramadol 50mg  one tablet every 8 hours as needed for severe pain.  We are checking labs today, will be in contact with any results that require further attention  Please follow up with me in about 3 months to recheck labs and medications.

## 2023-07-10 NOTE — Progress Notes (Signed)
 New Patient Office Visit  Subjective    Patient ID: [name] [name], female    DOB: [birth year]  Age: 88 y.o. MRN: [id number]  CC:  Chief Complaint  Patient presents with   Establish Care    HPI [name] [name] presents to establish care today. Previous patient of Dr Oneta Rack. She is accompanied by her husband today Has had hip replacement on 05/30/2023. Reports right hip pain persist with walking. States that she was prescribed hydrocodone, states she is not taking this and will not be taking this. She does not like the way this makes her feel. Denies any numbness, tingling, loss of strength in the extremity. She is still participating in physical therapy at home. Walking with a 2 wheeled walker. She is not fasting today. Denies other concerns today.  Outpatient Encounter Medications as of 07/10/2023  Medication Sig   acetaminophen (TYLENOL) 325 MG tablet Take 2 tablets (650 mg total) by mouth every 6 (six) hours as needed for mild pain (pain score 1-3) or moderate pain (pain score 4-6) (or Fever >/= 101).   albuterol (PROVENTIL) (2.5 MG/3ML) 0.083% nebulizer solution Take 3 mLs (2.5 mg total) by nebulization every 4 (four) hours as needed for wheezing or shortness of breath.   albuterol (VENTOLIN HFA) 108 (90 Base) MCG/ACT inhaler INHALE 2 PUFFS BY MOUTH EVERY 4 HOURS AS NEEDED FOR WHEEZING FOR SHORTNESS OF BREATH (Patient taking differently: Inhale 2 puffs into the lungs as needed for wheezing or shortness of breath.)   amLODipine (NORVASC) 10 MG tablet Take  1 tablet Daikly for BP & Heart                                                                        /                                                                   TAKE                                         BY                                                 MOUTH (Patient taking differently: Take 10 mg by mouth daily.)   aspirin EC 81 MG tablet Take 81 mg by mouth at bedtime. Every other night   benzonatate (TESSALON  PERLES) 100 MG capsule Take 1 capsule (100 mg total) by mouth every 6 (six) hours as needed for cough.   budesonide (PULMICORT) 0.25 MG/2ML nebulizer solution Take 2 mLs (0.25 mg total) by nebulization 4 (four) times daily.   busPIRone (BUSPAR) 10 MG tablet Take 1/2 to 1 tablet 3 x /day if Needed for Anxiety or Panic Attack  (( Replaces Xanax ))  Cholecalciferol (VITAMIN D3) 5000 units CAPS Take 5,000 Units by mouth 2 (two) times daily.    gabapentin (NEURONTIN) 300 MG capsule Take 300 mg by mouth 4 (four) times daily as needed.   hydrALAZINE (APRESOLINE) 25 MG tablet Take 1 tablet (25 mg total) by mouth 3 (three) times daily.   ketoconazole (NIZORAL) 2 % cream APPLY TO RINGWORM  RASH TWICE A DAY   Magnesium 400 MG TABS Take 400 mg by mouth daily at 2 PM.   melatonin 5 MG TABS Take 5 mg by mouth at bedtime as needed.   mometasone-formoterol (DULERA) 200-5 MCG/ACT AERO Take 2 puffs first thing in am and then another 2 puffs about 12 hours later.   nitroGLYCERIN (NITROSTAT) 0.4 MG SL tablet Place 1 tablet (0.4 mg total) under the tongue every 5 (five) minutes as needed for chest pain.   pantoprazole (PROTONIX) 40 MG tablet Take  1 tablet  Daily  to Prevent Heartburn &  Indigestion   polyethylene glycol (MIRALAX / GLYCOLAX) 17 g packet Take 17 g by mouth daily.   promethazine (PHENERGAN) 25 MG tablet Take  1 tablet   4 x / day  before Meals & Bedtime  as needed for Nausea   propranolol (INDERAL) 20 MG tablet Take 1 tablet (20 mg total) by mouth 2 (two) times daily.   rOPINIRole (REQUIP) 2 MG tablet Take 0.5 tablets (1 mg total) by mouth at bedtime.   senna-docusate (SENOKOT-S) 8.6-50 MG tablet Take 2 tablets by mouth 2 (two) times daily.   solifenacin (VESICARE) 5 MG tablet Take 5 mg by mouth daily.   sucralfate (CARAFATE) 1 g tablet Take 1 g by mouth 4 (four) times daily -  with meals and at bedtime.   traMADol (ULTRAM) 50 MG tablet Take 1 tablet (50 mg total) by mouth every 8 (eight) hours as  needed for up to 5 days.   vitamin B-12 (CYANOCOBALAMIN) 1000 MCG tablet Take 5,000 mcg by mouth daily.   [DISCONTINUED] HYDROcodone-acetaminophen (NORCO/VICODIN) 5-325 MG tablet Take 1 tablet by mouth 2 (two) times daily as needed.   [DISCONTINUED] oxyCODONE (OXY IR/ROXICODONE) 5 MG immediate release tablet Take 1-2 tablets (5-10 mg total) by mouth every 8 (eight) hours as needed for severe pain (pain score 7-10).   enoxaparin (LOVENOX) 40 MG/0.4ML injection Inject 0.4 mLs (40 mg total) into the skin daily for 28 days.   [DISCONTINUED] gabapentin (NEURONTIN) 600 MG tablet Take  1/2 to 1 tablet  3 to 4 x /Daily  as needed for Pain (Patient not taking: Reported on 07/10/2023)   No facility-administered encounter medications on file as of 07/10/2023.    Past Medical History:  Diagnosis Date   Anxiety    takes Xanax daily as needed   Asthma    Albuterol daily as needed   Blood transfusion without reported diagnosis    CAD (coronary artery disease)    LHC 2003 with 40% pLAD, 30% mLAD, 100% D1 (moderate vessel), 100%  D2 (small vessel), 95% mid small OM2.  Pt had PTCA to D1;  D2 and OM2 small vessels and tx medically;  Last Myoview (2012): anterior infarct seen with scar, no ischemia. EF normal. Med Rx // Myoview (12/14):  Low risk; ant scar w/ peri-infarct ischemia; EF 55% with ant AK // Myoview 5/16: EF 55-65, ant, apical scar, no ischemia    Cataract    Chronic back pain    HNP, DDD, spinal stenosis, vertebral compression fractures.    Chronic nausea  takes Zofran daily as needed.  normal gastric emptying study 03/2013   CKD (chronic kidney disease), stage III (HCC) 09/27/2017   Constipation    felt to be functional. takes Senokot and Miralax daily.  treated with Linzess in past.    DIVERTICULOSIS, COLON 08/28/2008   Qualifier: Diagnosis of  By: Bascom Levels, [name], [name]     Elevated LFTs 2015   probably med induced DILI, from Augmentin vs Pravastatin   GERD (gastroesophageal reflux disease)     hx of esophageal stricture    Hiatal hernia    Paraesophageal hernias. s/p fundoplications in 2013 and 04/2013   History of bronchitis several of yrs ago   History of colon polyps 03/2009, 03/2011   adenomatous colon polyps, no high grade dysplasia.    History of vertigo    no meds   Hyperlipidemia    takes Pravastatin daily   Hypertension    takes Amlodipine,Metoprolol,and Losartan daily   Multiple closed pelvic fractures without disruption of pelvic circle (HCC)    Nausea 03/2016   Osteoporosis    PONV (postoperative nausea and vomiting)    yrs ago   Pubic ramus fracture (HCC) 12/20/2019   Slow urinary stream    takes Rapaflo daily   TIA (transient ischemic attack) 1995   no residual effects noted   Vertebral compression fracture (HCC) 06/23/2012    Past Surgical History:  Procedure Laterality Date   APPENDECTOMY     patient unsure of date   BACK SURGERY     BLADDER REPAIR     CHOLECYSTECTOMY     Patient unsure of date.   CORONARY ANGIOPLASTY  11/16/2001   1 stent   ESOPHAGEAL MANOMETRY N/A 03/25/2013   Procedure: ESOPHAGEAL MANOMETRY (EM);  Surgeon: Louis Meckel, MD;  Location: [facility] END[facility]COPY;  Service: Endoscopy;  Laterality: N/A;   ESOPHAGOGASTRODUODENOSCOPY N/A 03/15/2013   Procedure: ESOPHAGOGASTRODUODENOSCOPY (EGD);  Surgeon: Beverley Fiedler, MD;  Location: [facility] ENDOSCOPY;  Service: Endoscopy;  Laterality: N/A;   ESOPHAGOGASTRODUODENOSCOPY N/A 12/25/2015   Procedure: ESOPHAGOGASTRODUODENOSCOPY (EGD);  Surgeon: Sherrilyn Rist, MD;  Location: [facility] ENDOSCOPY;  Service: Endoscopy;  Laterality: N/A;   EYE SURGERY  11/2000   bilateral cataracts with lens implant   FIXATION KYPHOPLASTY LUMBAR SPINE  X 2   "L3-4"   HEMORRHOID SURGERY     HIATAL HERNIA REPAIR  06/03/2011   Procedure: LAPAROSCOPIC REPAIR OF HIATAL HERNIA;  Surgeon: Ernestene Mention, MD;  Location: [facility] ORS;  Service: General;  Laterality: N/A;   HIATAL HERNIA REPAIR N/A 04/24/2013   Procedure: LAPAROSCOPIC   REPAIR RECURRENT PARASOPHAGEAL HIATAL HERNIA WITH FUNDOPLICATION;  Surgeon: Ardeth Sportsman, MD;  Location: [facility] ORS;  Service: General;  Laterality: N/A;   INSERTION OF MESH N/A 04/24/2013   Procedure: INSERTION OF MESH;  Surgeon: Ardeth Sportsman, MD;  Location: [facility];  Service: General;  Laterality: N/A;   INTRAMEDULLARY (IM) NAIL INTERTROCHANTERIC Right 05/30/2023   Procedure: INTRAMEDULLARY (IM) NAIL INTERTROCHANTERIC;  Surgeon: Myrene Galas, MD;  Location: [facility] OR;  Service: Orthopedics;  Laterality: Right;   IR KYPHO THORACIC WITH BONE BIOPSY  09/29/2017   IR KYPHO THORACIC WITH BONE BIOPSY  11/03/2017   IR KYPHO THORACIC WITH BONE BIOPSY  02/14/2019   IR KYPHO THORACIC WITH BONE BIOPSY  04/02/2019   LAPAROSCOPIC LYSIS OF ADHESIONS N/A 04/24/2013   Procedure: LAPAROSCOPIC LYSIS OF ADHESIONS;  Surgeon: Ardeth Sportsman, MD;  Location: [facility];  Service: General;  Laterality: N/A;   LAPAROSCOPIC NISSEN FUNDOPLICATION  06/03/2011   Procedure: LAPAROSCOPIC NISSEN FUNDOPLICATION;  Surgeon: Ernestene Mention, MD;  Location: [facility];  Service: General;  Laterality: N/A;   LUMBAR FUSION  12/07/2015   Right L2-3 facetectomy, posterolateral fusion L2-3 with pedicle screws, rods, local bone graft, Vivigen cancellous chips. Fusion extended to the L1 level with pedicle screws and rods   LUMBAR LAMINECTOMY/DECOMPRESSION MICRODISCECTOMY Right 06/26/2012   Procedure: LUMBAR LAMINECTOMY/DECOMPRESSION MICRODISCECTOMY;  Surgeon: Kerrin Champagne, MD;  Location: [facility] OR;  Service: Orthopedics;  Laterality: Right;  RIGHT L4-5 MICRODISCECTOMY   LUMBAR LAMINECTOMY/DECOMPRESSION MICRODISCECTOMY N/A 05/29/2015   Procedure: RIGHT L2-3 MICRODISCECTOMY;  Surgeon: Kerrin Champagne, MD;  Location: [facility] OR;  Service: Orthopedics;  Laterality: N/A;   TOTAL ABDOMINAL HYSTERECTOMY  1975   partial   UPPER GASTROINTESTINAL ENDOSCOPY      Family History  Problem Relation Age of Onset   Colon cancer Mother 80   Heart attack Father 68        heart attack   Brain cancer Brother        tumor    Heart disease Brother    Ulcers Brother    Arthritis Brother    Kidney disease Daughter    Esophageal cancer Neg Hx    Stomach cancer Neg Hx     Social History   Socioeconomic History   Marital status: Married    Spouse name: [name]   Number of children: 2   Years of education: 12   Highest education level: Not on file  Occupational History   Occupation: Retired    Associate Professor: RETIRED    Comment: worked at [employer]  Tobacco Use   Smoking status: Never   Smokeless tobacco: Never  Vaping Use   Vaping status: Never Used  Substance and Sexual Activity   Alcohol use: No   Drug use: Never   Sexual activity: Not Currently    Partners: Male    Birth control/protection: Post-menopausal, Surgical  Other Topics Concern   Not on file  Social History Narrative   Lives with husband.   Right-handed.   3 cups caffeine per day.   Social Drivers of Corporate investment banker Strain: Not on file  Food Insecurity: No Food Insecurity (05/29/2023)   Hunger Vital Sign    Worried About Running Out of Food in the Last Year: Never true    Ran Out of Food in the Last Year: Never true  Transportation Needs: No Transportation Needs (05/29/2023)   PRAPARE - Administrator, Civil Service (Medical): No    Lack of Transportation (Non-Medical): No  Physical Activity: Not on file  Stress: Not on file  Social Connections: Socially Integrated (05/29/2023)   Social Connection and Isolation Panel [NHANES]    Frequency of Communication with Friends and Family: Three times a week    Frequency of Social Gatherings with Friends and Family: Twice a week    Attends Religious Services: More than 4 times per year    Active Member of [employer] or Organizations: Yes    Attends Banker Meetings: 1 to 4 times per year    Marital Status: Married  Catering manager Violence: Not At Risk (05/29/2023)   Humiliation, Afraid, Rape, and  Kick questionnaire    Fear of Current or Ex-Partner: No    Emotionally Abused: No    Physically Abused: No    Sexually Abused: No    ROS Per HPI      Objective    BP 120/80 (  BP Location: Left Arm, Patient Position: Sitting)   Pulse 67   Temp 98 F (36.7 C) (Temporal)   Ht 5\' 1"  (1.549 m)   Wt 149 lb (67.6 kg)   SpO2 98%   BMI 28.15 kg/m   Physical Exam Vitals and nursing note reviewed.  Constitutional:      General: She is not in acute distress.    Appearance: Normal appearance. She is normal weight.  HENT:     Head: Normocephalic and atraumatic.     Nose: Nose normal.  Eyes:     Extraocular Movements: Extraocular movements intact.     Pupils: Pupils are equal, round, and reactive to light.  Cardiovascular:     Rate and Rhythm: Normal rate and regular rhythm.     Heart sounds: Normal heart sounds.  Pulmonary:     Effort: Pulmonary effort is normal. No respiratory distress.     Breath sounds: Normal breath sounds. No wheezing, rhonchi or rales.  Musculoskeletal:     Cervical back: Normal range of motion.     Comments: Using 2 wheel walker, favoring R leg. R anterior hip tender, no erythema, no bruising, no obvious deformity.  Lymphadenopathy:     Cervical: No cervical adenopathy.  Neurological:     General: No focal deficit present.     Mental Status: She is alert and oriented to person, place, and time.  Psychiatric:        Mood and Affect: Mood normal.        Thought Content: Thought content normal.        Assessment & Plan:   Essential hypertension -     CBC with Differential/Platelet -     Comprehensive metabolic panel -     TSH  MGUS (monoclonal gammopathy of unknown significance) -     CBC with Differential/Platelet -     TSH  Recurrent major depressive disorder, in partial remission (HCC) -     CBC with Differential/Platelet -     Comprehensive metabolic panel -     TSH  Chronic pain syndrome -     CBC with Differential/Platelet -      Comprehensive metabolic panel -     traMADol HCl; Take 1 tablet (50 mg total) by mouth every 8 (eight) hours as needed for up to 5 days.  Dispense: 15 tablet; Refill: 0  Spinal stenosis, lumbar region, with neurogenic claudication -     traMADol HCl; Take 1 tablet (50 mg total) by mouth every 8 (eight) hours as needed for up to 5 days.  Dispense: 15 tablet; Refill: 0  Other fatigue -     CBC with Differential/Platelet -     Comprehensive metabolic panel -     TSH  Right hip pain -     traMADol HCl; Take 1 tablet (50 mg total) by mouth every 8 (eight) hours as needed for up to 5 days.  Dispense: 15 tablet; Refill: 0     Return in about 3 months (around 10/10/2023) for meds, labs.   Moshe Cipro, FNP

## 2023-07-14 ENCOUNTER — Telehealth: Payer: Self-pay

## 2023-07-14 DIAGNOSIS — G894 Chronic pain syndrome: Secondary | ICD-10-CM

## 2023-07-14 MED ORDER — TRAMADOL HCL 50 MG PO TABS: 50.0000 mg | ORAL_TABLET | Freq: Three times a day (TID) | ORAL | 0 refills | Status: DC | PRN

## 2023-07-14 NOTE — Telephone Encounter (Signed)
 Copied from CRM [id number]. Topic: Clinical - Medication Question >> Jul 14, 2023 10:02 AM [name] [name] H wrote: Reason for CRM: Patient is calling in to notify provider that the traMADol (ULTRAM) 50 MG tablet are working for her. Patient states she was told to call in to notify provider if medication is working so she can call in more refills.   [name] [name] [phone]

## 2023-07-14 NOTE — Telephone Encounter (Signed)
 Patient notified

## 2023-07-27 ENCOUNTER — Ambulatory Visit (INDEPENDENT_AMBULATORY_CARE_PROVIDER_SITE_OTHER)

## 2023-07-27 VITALS — Ht 63.0 in | Wt 149.0 lb

## 2023-07-27 DIAGNOSIS — Z0000 Encounter for general adult medical examination without abnormal findings: Secondary | ICD-10-CM

## 2023-07-27 NOTE — Progress Notes (Signed)
 Subjective:   [name] [name] is a 88 y.o. who presents for a Medicare Wellness preventive visit.  Visit Complete: Virtual I connected with  [name] [name] on 07/27/23 by a audio enabled telemedicine application and verified that I am speaking with the correct person using two identifiers.  Patient Location: Home  Provider Location: Office/Clinic  I discussed the limitations of evaluation and management by telemedicine. The patient expressed understanding and agreed to proceed.  Vital Signs: Because this visit was a virtual/telehealth visit, some criteria may be missing or patient reported. Any vitals not documented were not able to be obtained and vitals that have been documented are patient reported.  VideoDeclined- This patient declined Librarian, academic. Therefore the visit was completed with audio only.  Persons Participating in Visit: Patient.  AWV Questionnaire: No: Patient Medicare AWV questionnaire was not completed prior to this visit.  Cardiac Risk Factors include: advanced age (>24men, >70 women);hypertension     Objective:    Today's Vitals   07/27/23 1049  Weight: 149 lb (67.6 kg)  Height: 5\' 3"  (1.6 m)   Body mass index is 26.39 kg/m.     07/27/2023   10:46 AM 05/30/2023    9:27 AM 05/29/2023    8:56 AM 07/22/2021   10:47 AM 07/22/2020   11:05 AM 12/21/2019    9:00 AM 07/05/2019   10:59 AM  Advanced Directives  Does Patient Have a Medical Advance Directive? Yes Yes No Yes Yes Yes Yes  Type of Estate agent of [place];Living will Living will;Healthcare Power of Attorney  Living will Healthcare Power of [name];Living will Healthcare Power of [place];Living will Healthcare Power of [place];Living will  Does patient want to make changes to medical advance directive?    No - Patient declined No - Patient declined No - Patient declined No - Patient declined  Copy of Healthcare Power of Attorney in Chart? No -  copy requested No - copy requested   Yes - validated most recent copy scanned in chart (See row information) No - copy requested No - copy requested  Would patient like information on creating a medical advance directive?   No - Patient declined        Current Medications (verified) Outpatient Encounter Medications as of 07/27/2023  Medication Sig   acetaminophen (TYLENOL) 325 MG tablet Take 2 tablets (650 mg total) by mouth every 6 (six) hours as needed for mild pain (pain score 1-3) or moderate pain (pain score 4-6) (or Fever >/= 101).   albuterol (PROVENTIL) (2.5 MG/3ML) 0.083% nebulizer solution Take 3 mLs (2.5 mg total) by nebulization every 4 (four) hours as needed for wheezing or shortness of breath.   albuterol (VENTOLIN HFA) 108 (90 Base) MCG/ACT inhaler INHALE 2 PUFFS BY MOUTH EVERY 4 HOURS AS NEEDED FOR WHEEZING FOR SHORTNESS OF BREATH (Patient taking differently: Inhale 2 puffs into the lungs as needed for wheezing or shortness of breath.)   amLODipine (NORVASC) 10 MG tablet Take  1 tablet Daikly for BP & Heart                                                                        /  TAKE                                         BY                                                 MOUTH (Patient taking differently: Take 10 mg by mouth daily.)   aspirin EC 81 MG tablet Take 81 mg by mouth at bedtime. Every other night   benzonatate (TESSALON PERLES) 100 MG capsule Take 1 capsule (100 mg total) by mouth every 6 (six) hours as needed for cough.   budesonide (PULMICORT) 0.25 MG/2ML nebulizer solution Take 2 mLs (0.25 mg total) by nebulization 4 (four) times daily.   busPIRone (BUSPAR) 10 MG tablet Take 1/2 to 1 tablet 3 x /day if Needed for Anxiety or Panic Attack  (( Replaces Xanax ))   Cholecalciferol (VITAMIN D3) 5000 units CAPS Take 5,000 Units by mouth 2 (two) times daily.    gabapentin (NEURONTIN) 300 MG capsule Take 300 mg by  mouth 4 (four) times daily as needed.   hydrALAZINE (APRESOLINE) 25 MG tablet Take 1 tablet (25 mg total) by mouth 3 (three) times daily.   ketoconazole (NIZORAL) 2 % cream APPLY TO RINGWORM  RASH TWICE A DAY   Magnesium 400 MG TABS Take 400 mg by mouth daily at 2 PM.   melatonin 5 MG TABS Take 5 mg by mouth at bedtime as needed.   mometasone-formoterol (DULERA) 200-5 MCG/ACT AERO Take 2 puffs first thing in am and then another 2 puffs about 12 hours later.   nitroGLYCERIN (NITROSTAT) 0.4 MG SL tablet Place 1 tablet (0.4 mg total) under the tongue every 5 (five) minutes as needed for chest pain.   pantoprazole (PROTONIX) 40 MG tablet Take  1 tablet  Daily  to Prevent Heartburn &  Indigestion   polyethylene glycol (MIRALAX / GLYCOLAX) 17 g packet Take 17 g by mouth daily.   promethazine (PHENERGAN) 25 MG tablet Take  1 tablet   4 x / day  before Meals & Bedtime  as needed for Nausea   propranolol (INDERAL) 20 MG tablet Take 1 tablet (20 mg total) by mouth 2 (two) times daily.   rOPINIRole (REQUIP) 2 MG tablet Take 0.5 tablets (1 mg total) by mouth at bedtime.   senna-docusate (SENOKOT-S) 8.6-50 MG tablet Take 2 tablets by mouth 2 (two) times daily.   solifenacin (VESICARE) 5 MG tablet Take 5 mg by mouth daily.   sucralfate (CARAFATE) 1 g tablet Take 1 g by mouth 4 (four) times daily -  with meals and at bedtime.   traMADol (ULTRAM) 50 MG tablet Take 1 tablet (50 mg total) by mouth every 8 (eight) hours as needed.   vitamin B-12 (CYANOCOBALAMIN) 1000 MCG tablet Take 5,000 mcg by mouth daily.   [DISCONTINUED] enoxaparin (LOVENOX) 40 MG/0.4ML injection Inject 0.4 mLs (40 mg total) into the skin daily for 28 days.   No facility-administered encounter medications on file as of 07/27/2023.    Allergies (verified) Ace inhibitors, Aspartame, Atorvastatin, Biaxin [clarithromycin], Daliresp [roflumilast], Erythromycin, and Levofloxacin   History: Past Medical History:  Diagnosis Date   Anxiety     takes Xanax daily as needed   Asthma    Albuterol  daily as needed   Blood transfusion without reported diagnosis    CAD (coronary artery disease)    LHC 2003 with 40% pLAD, 30% mLAD, 100% D1 (moderate vessel), 100%  D2 (small vessel), 95% mid small OM2.  Pt had PTCA to D1;  D2 and OM2 small vessels and tx medically;  Last Myoview (2012): anterior infarct seen with scar, no ischemia. EF normal. Med Rx // Myoview (12/14):  Low risk; ant scar w/ peri-infarct ischemia; EF 55% with ant AK // Myoview 5/16: EF 55-65, ant, apical scar, no ischemia    Cataract    Chronic back pain    HNP, DDD, spinal stenosis, vertebral compression fractures.    Chronic nausea    takes Zofran daily as needed.  normal gastric emptying study 03/2013   CKD (chronic kidney disease), stage III (HCC) 09/27/2017   Constipation    felt to be functional. takes Senokot and Miralax daily.  treated with Linzess in past.    DIVERTICULOSIS, COLON 08/28/2008   Qualifier: Diagnosis of  By: Bascom Levels, [name], [name]     Elevated LFTs 2015   probably med induced DILI, from Augmentin vs Pravastatin   GERD (gastroesophageal reflux disease)    hx of esophageal stricture    Hiatal hernia    Paraesophageal hernias. s/p fundoplications in 2013 and 04/2013   History of bronchitis several of yrs ago   History of colon polyps 03/2009, 03/2011   adenomatous colon polyps, no high grade dysplasia.    History of vertigo    no meds   Hyperlipidemia    takes Pravastatin daily   Hypertension    takes Amlodipine,Metoprolol,and Losartan daily   Multiple closed pelvic fractures without disruption of pelvic circle (HCC)    Nausea 03/2016   Osteoporosis    PONV (postoperative nausea and vomiting)    yrs ago   Pubic ramus fracture (HCC) 12/20/2019   Slow urinary stream    takes Rapaflo daily   TIA (transient ischemic attack) 1995   no residual effects noted   Vertebral compression fracture (HCC) 06/23/2012   Past Surgical History:  Procedure  Laterality Date   APPENDECTOMY     patient unsure of date   BACK SURGERY     BLADDER REPAIR     CHOLECYSTECTOMY     Patient unsure of date.   CORONARY ANGIOPLASTY  11/16/2001   1 stent   ESOPHAGEAL MANOMETRY N/A 03/25/2013   Procedure: ESOPHAGEAL MANOMETRY (EM);  Surgeon: Louis Meckel, MD;  Location: [facility] ENDOSCOPY;  Service: Endoscopy;  Laterality: N/A;   ESOPHAGOGASTRODUODENOSCOPY N/A 03/15/2013   Procedure: ESOPHAGOGASTRODUODENOSCOPY (EGD);  Surgeon: Beverley Fiedler, MD;  Location[facility] ENDOSCOPY;  Service: Endoscopy;  Laterality: N/A;   ESOPHAGOGASTRODUODENOSCOPY N/A 12/25/2015   Procedure: ESOPHAGOGASTRODUODENOSCOPY (EGD);  Surgeon: Sherrilyn Rist, MD;  Location: [facility] ENDOSCOPY;  Service: Endoscopy;  Laterality: N/A;   EYE SURGERY  11/2000   bilateral cataracts with lens implant   FIXATION KYPHOPLASTY LUMBAR SPINE  X 2   "L3-4"   HEMORRHOID SURGERY     HIATAL HERNIA REPAIR  06/03/2011   Procedure: LAPAROSCOPIC REPAIR OF HIATAL HERNIA;  Surgeon: Ernestene Mention, MD;  Location: [facility] ORS;  Service: General;  Laterality: N/A;   HIATAL HERNIA REPAIR N/A 04/24/2013   Procedure: LAPAROSCOPIC  REPAIR RECURRENT PARASOPHAGEAL HIATAL HERNIA WITH FUNDOPLICATION;  Surgeon: Ardeth Sportsman, MD;  Location: [facility] ORS;  Service: General;  Laterality: N/A;   INSERTION OF MESH N/A 04/24/2013   Procedure: INSERTION OF MESH;  Surgeon: Ardeth Sportsman, MD;  Location: [facility] ORS;  Service: General;  Laterality: N/A;   INTRAMEDULLARY (IM) NAIL INTERTROCHANTERIC Right 05/30/2023   Procedure: INTRAMEDULLARY (IM) NAIL INTERTROCHANTERIC;  Surgeon: Myrene Galas, MD;  Location: [facility] OR;  Service: Orthopedics;  Laterality: Right;   IR KYPHO THORACIC WITH BONE BIOPSY  09/29/2017   IR KYPHO THORACIC WITH BONE BIOPSY  11/03/2017   IR KYPHO THORACIC WITH BONE BIOPSY  02/14/2019   IR KYPHO THORACIC WITH BONE BIOPSY  04/02/2019   LAPAROSCOPIC LYSIS OF ADHESIONS N/A 04/24/2013   Procedure: LAPAROSCOPIC LYSIS OF ADHESIONS;  Surgeon:  Ardeth Sportsman, MD;  Location: [facility] OR[facility];  Service: General;  Laterality: N/A;   LAPAROSCOPIC NISSEN FUNDOPLICATION  06/03/2011   Procedure: LAPAROSCOPIC NISSEN FUNDOPLICATION;  Surgeon: Ernestene Mention, MD;  Location: [facility] ORS;  Service: General;  Laterality: N/A;   LUMBAR FUSION  12/07/2015   Right L2-3 facetectomy, posterolateral fusion L2-3 with pedicle screws, rods, local bone graft, Vivigen cancellous chips. Fusion extended to the L1 level with pedicle screws and rods   LUMBAR LAMINECTOMY/DECOMPRESSION MICRODISCECTOMY Right 06/26/2012   Procedure: LUMBAR LAMINECTOMY/DECOMPRESSION MICRODISCECTOMY;  Surgeon: Kerrin Champagne, MD;  Location: [facility] OR;  Service: Orthopedics;  Laterality: Right;  RIGHT L4-5 MICRODISCECTOMY   LUMBAR LAMINECTOMY/DECOMPRESSION MICRODISCECTOMY N/A 05/29/2015   Procedure: RIGHT L2-3 MICRODISCECTOMY;  Surgeon: Kerrin Champagne, MD;  Location: [facility] OR;  Service: Orthopedics;  Laterality: N/A;   TOTAL ABDOMINAL HYSTERECTOMY  1975   partial   UPPER GASTROINTESTINAL ENDOSCOPY     Family History  Problem Relation Age of Onset   Colon cancer Mother 25   Heart attack Father 55       heart attack   Brain cancer Brother        tumor    Heart disease Brother    Ulcers Brother    Arthritis Brother    Kidney disease Daughter    Esophageal cancer Neg Hx    Stomach cancer Neg Hx    Social History   Socioeconomic History   Marital status: Married    Spouse name: [name]   Number of children: 2   Years of education: 12   Highest education level: Not on file  Occupational History   Occupation: Retired    Associate Professor: RETIRED    Comment: worked at [employer]  Tobacco Use   Smoking status: Never    Passive exposure: Never   Smokeless tobacco: Never  Vaping Use   Vaping status: Never Used  Substance and Sexual Activity   Alcohol use: No   Drug use: Never   Sexual activity: Not Currently    Partners: Male    Birth control/protection: Post-menopausal, Surgical  Other  Topics Concern   Not on file  Social History Narrative   Lives with husband.   Right-handed.   3 cups caffeine per day.   Social Drivers of Corporate investment banker Strain: Low Risk  (07/27/2023)   Overall Financial Resource Strain (CARDIA)    Difficulty of Paying Living Expenses: Not hard at all  Food Insecurity: No Food Insecurity (07/27/2023)   Hunger Vital Sign    Worried About Running Out of Food in the Last Year: Never true    Ran Out of Food in the Last Year: Never true  Transportation Needs: No Transportation Needs (07/27/2023)   PRAPARE - Administrator, Civil Service (Medical): No    Lack of Transportation (Non-Medical): No  Physical Activity: Insufficiently Active (07/27/2023)   Exercise  Vital Sign    Days of Exercise per Week: 2 days    Minutes of Exercise per Session: 30 min  Stress: Stress Concern Present (07/27/2023)   [employer] of Occupational Health - Occupational Stress Questionnaire    Feeling of Stress : To some extent  Social Connections: Socially Integrated (07/27/2023)   Social Connection and Isolation Panel [NHANES]    Frequency of Communication with Friends and Family: More than three times a week    Frequency of Social Gatherings with Friends and Family: Once a week    Attends Religious Services: More than 4 times per year    Active Member of [employer] or Organizations: Yes    Attends Banker Meetings: Never    Marital Status: Married    Tobacco Counseling Counseling given: No    Clinical Intake:  Pre-visit preparation completed: Yes  Pain : No/denies pain     BMI - recorded: 26.39 Nutritional Status: BMI 25 -29 Overweight Nutritional Risks: None Diabetes: No  Lab Results  Component Value Date   HGBA1C 6.0 (H) 10/31/2022   HGBA1C 6.1 (H) 07/22/2022   HGBA1C 6.2 (H) 05/23/2022     How often do you need to have someone help you when you read instructions, pamphlets, or other written materials from your  doctor or pharmacy?: 1 - Never  Interpreter Needed?: No  Information entered by :: Hassell Halim, CMA   Activities of Daily Living     07/27/2023   10:52 AM 05/29/2023    6:35 PM  In your present state of health, do you have any difficulty performing the following activities:  Hearing? 0 0  Vision? 0 0  Difficulty concentrating or making decisions? 0 0  Walking or climbing stairs? 0   Dressing or bathing? 0   Doing errands, shopping? 0 0  Preparing Food and eating ? N   Using the Toilet? N   In the past six months, have you accidently leaked urine? Y   Comment wears a pad   Do you have problems with loss of bowel control? N   Managing your Medications? N   Managing your Finances? N   Housekeeping or managing your Housekeeping? N     Patient Care Team: Moshe Cipro, FNP as PCP - General (Family Medicine) Jake Bathe, MD as PCP - Cardiology (Cardiology) Louis Meckel, MD (Inactive) as Consulting Physician (Gastroenterology) Oretha Milch, MD as Consulting Physician (Pulmonary Disease) Laurey Morale, MD as Consulting Physician (Cardiology) Karie Soda, MD as Consulting Physician (General Surgery) Kerrin Champagne, MD (Inactive) as Consulting Physician (Orthopedic Surgery) Carlus Pavlov, MD as Consulting Physician (Endocrinology) Si Gaul, MD as Consulting Physician (Oncology) Lajoyce Corners, [facility] (Inactive) as Pharmacist (Pharmacist) Elise Benne, MD as Consulting Physician (Ophthalmology)  Indicate any recent Medical Services you may have received from other than [facility] providers in the past year (date may be approximate).     Assessment:   This is a routine wellness examination for [place].  Hearing/Vision screen Hearing Screening - Comments:: Denies hearing difficulties   Vision Screening - Comments:: Wears eyeglasses - up to date with routine eye exams with Dr Emily Filbert    Goals Addressed               This Visit's Progress     Patient  Stated (pt-stated)        Patient stated she plans to stay active.       Depression Screen     07/27/2023  10:55 AM 06/30/2023   10:56 AM 02/12/2023    9:26 PM 05/23/2022    8:19 PM 05/23/2022    8:18 PM 03/13/2022    8:53 PM 10/28/2021   10:16 AM  PHQ 2/9 Scores  PHQ - 2 Score 1 0 0 0 0 0 0  PHQ- 9 Score 3        Exception Documentation     Medical reason      Fall Risk     07/27/2023   10:53 AM 06/30/2023   10:56 AM 02/12/2023    9:26 PM 05/23/2022    8:17 PM 03/13/2022    8:52 PM  Fall Risk   Falls in the past year? 1 1 0 1 0  Number falls in past yr: 1 0  0   Injury with Fall? 1 1  0   Comment fractured right hip      Risk for fall due to : Impaired balance/gait;History of fall(s) No Fall Risks No Fall Risks Impaired balance/gait;History of fall(s) No Fall Risks  Follow up Falls evaluation completed;Falls prevention discussed;Education provided Falls evaluation completed Falls prevention discussed;Education provided;Falls evaluation completed Education provided;Falls prevention discussed Falls prevention discussed;Education provided;Falls evaluation completed    MEDICARE RISK AT HOME:  Medicare Risk at Home Any stairs in or around the home?: No If so, are there any without handrails?: No Home free of loose throw rugs in walkways, pet beds, electrical cords, etc?: Yes Adequate lighting in your home to reduce risk of falls?: Yes Life alert?: No Use of a cane, walker or w/c?: Yes (cane/walker) Grab bars in the bathroom?: Yes Shower chair or bench in shower?: Yes Elevated toilet seat or a handicapped toilet?: Yes  TIMED UP AND GO:  Was the test performed?  No  Cognitive Function: 6CIT completed        07/27/2023   10:54 AM  6CIT Screen  What Year? 0 points  What month? 0 points  What time? 0 points  Count back from 20 0 points  Months in reverse 2 points  Repeat phrase 0 points  Total Score 2 points    Immunizations Immunization History  Administered  Date(s) Administered   DT (Pediatric) 05/06/2014   Influenza Split 02/07/2011   Influenza Whole 02/09/2009, 01/19/2010, 02/13/2012   Influenza, High Dose Seasonal PF 03/22/2013, 01/15/2014, 02/27/2015, 01/14/2016, 01/18/2017, 02/14/2018, 02/14/2019, 03/16/2020, 02/05/2021, 03/10/2022, 02/13/2023   PFIZER(Purple Top)SARS-COV-2 Vaccination 05/25/2019, 06/15/2019, 03/24/2020   Pneumococcal Conjugate-13 05/06/2014   Pneumococcal Polysaccharide-23 02/09/2011   Pneumococcal-Unspecified 02/06/2010, 02/09/2012   Td 05/10/2003    Screening Tests Health Maintenance  Topic Date Due   Zoster Vaccines- Shingrix (1 of 2) Never done   MAMMOGRAM  09/10/2022   COVID-19 Vaccine (4 - 2024-25 season) 01/08/2023   DTaP/Tdap/Td (3 - Tdap) 05/06/2024   Medicare Annual Wellness (AWV)  07/26/2024   Pneumonia Vaccine 26+ Years old  Completed   INFLUENZA VACCINE  Completed   DEXA SCAN  Completed   HPV VACCINES  Aged Out   Colonoscopy  Discontinued    Health Maintenance  Health Maintenance Due  Topic Date Due   Zoster Vaccines- Shingrix (1 of 2) Never done   MAMMOGRAM  09/10/2022   COVID-19 Vaccine (4 - 2024-25 season) 01/08/2023   Health Maintenance Items Addressed: 07/27/2023   Additional Screening:  Vision Screening: Recommended annual ophthalmology exams for early detection of glaucoma and other disorders of the eye. Pt is followed by Dr Emily Filbert.  Dental Screening: Recommended annual dental exams for proper  oral hygiene  Community Resource Referral / Chronic Care Management: CRR required this visit?  No   CCM required this visit?  No     Plan:     I have personally reviewed and noted the following in the patient's chart:   Medical and social history Use of alcohol, tobacco or illicit drugs  Current medications and supplements including opioid prescriptions. Patient is currently taking opioid prescriptions. Information provided to patient regarding non-opioid alternatives. Patient  advised to discuss non-opioid treatment plan with their provider. Functional ability and status Nutritional status Physical activity Advanced directives List of other physicians Hospitalizations, surgeries, and ER visits in previous 12 months Vitals Screenings to include cognitive, depression, and falls Referrals and appointments  In addition, I have reviewed and discussed with patient certain preventive protocols, quality metrics, and best practice recommendations. A written personalized care plan for preventive services as well as general preventive health recommendations were provided to patient.     Darreld Mclean, CMA   07/27/2023   After Visit Summary: (Declined) Due to this being a telephonic visit, with patients personalized plan was offered to patient but patient Declined AVS at this time   Notes: Nothing significant to report at this time.

## 2023-07-27 NOTE — Patient Instructions (Addendum)
 [name] [name] , Thank you for taking time to come for your Medicare Wellness Visit. I appreciate your ongoing commitment to your health goals. Please review the following plan we discussed and let me know if I can assist you in the future.   Referrals/Orders/Follow-Ups/Clinician Recommendations: Aim for 30 minutes of exercise or brisk walking, 6-8 glasses of water, and 5 servings of fruits and vegetables each day.   This is a list of the screening recommended for you and due dates:  Health Maintenance  Topic Date Due   Zoster (Shingles) Vaccine (1 of 2) Never done   Mammogram  09/10/2022   COVID-19 Vaccine (4 - 2024-25 season) 01/08/2023   DTaP/Tdap/Td vaccine (3 - Tdap) 05/06/2024   Medicare Annual Wellness Visit  07/26/2024   Pneumonia Vaccine  Completed   Flu Shot  Completed   DEXA scan (bone density measurement)  Completed   HPV Vaccine  Aged Out   Colon Cancer Screening  Discontinued    Advanced directives: (Copy Requested) Please bring a copy of your health care power of attorney and living will to the office to be added to your chart at your convenience. You can mail to [facility] [address] [place], [place] [address] or email to [email or web address]  Next Medicare Annual Wellness Visit scheduled for next year: Yes  Managing Pain Without Opioids Opioids are strong medicines used to treat moderate to severe pain. For some people, especially those who have long-term (chronic) pain, opioids may not be the best choice for pain management due to: Side effects like nausea, constipation, and sleepiness. The risk of addiction (opioid use disorder). The longer you take opioids, the greater your risk of addiction. Pain that lasts for more than 3 months is called chronic pain. Managing chronic pain usually requires more than one approach and is often provided by a team of health care providers working together (multidisciplinary approach). Pain management may be done  at a pain management center or pain clinic. How to manage pain without the use of opioids Use non-opioid medicines Non-opioid medicines for pain may include: Over-the-counter or prescription non-steroidal anti-inflammatory drugs (NSAIDs). These may be the first medicines used for pain. They work well for muscle and bone pain, and they reduce swelling. Acetaminophen. This over-the-counter medicine may work well for milder pain but not swelling. Antidepressants. These may be used to treat chronic pain. A certain type of antidepressant (tricyclics) is often used. These medicines are given in lower doses for pain than when used for depression. Anticonvulsants. These are usually used to treat seizures but may also reduce nerve (neuropathic) pain. Muscle relaxants. These relieve pain caused by sudden muscle tightening (spasms). You may also use a pain medicine that is applied to the skin as a patch, cream, or gel (topical analgesic), such as a numbing medicine. These may cause fewer side effects than medicines taken by mouth. Do certain therapies as directed Some therapies can help with pain management. They include: Physical therapy. You will do exercises to gain strength and flexibility. A physical therapist may teach you exercises to move and stretch parts of your body that are weak, stiff, or painful. You can learn these exercises at physical therapy visits and practice them at home. Physical therapy may also involve: Massage. Heat wraps or applying heat or cold to affected areas. Electrical signals that interrupt pain signals (transcutaneous electrical nerve stimulation, TENS). Weak lasers that reduce pain and swelling (low-level laser therapy). Signals from your body that help  you learn to regulate pain (biofeedback). Occupational therapy. This helps you to learn ways to function at home and work with less pain. Recreational therapy. This involves trying new activities or hobbies, such as a  physical activity or drawing. Mental health therapy, including: Cognitive behavioral therapy (CBT). This helps you learn coping skills for dealing with pain. Acceptance and commitment therapy (ACT) to change the way you think and react to pain. Relaxation therapies, including muscle relaxation exercises and mindfulness-based stress reduction. Pain management counseling. This may be individual, family, or group counseling.  Receive medical treatments Medical treatments for pain management include: Nerve block injections. These may include a pain blocker and anti-inflammatory medicines. You may have injections: Near the spine to relieve chronic back or neck pain. Into joints to relieve back or joint pain. Into nerve areas that supply a painful area to relieve body pain. Into muscles (trigger point injections) to relieve some painful muscle conditions. A medical device placed near your spine to help block pain signals and relieve nerve pain or chronic back pain (spinal cord stimulation device). Acupuncture. Follow these instructions at home Medicines Take over-the-counter and prescription medicines only as told by your health care provider. If you are taking pain medicine, ask your health care providers about possible side effects to watch out for. Do not drive or use heavy machinery while taking prescription opioid pain medicine. Lifestyle  Do not use drugs or alcohol to reduce pain. If you drink alcohol, limit how much you have to: 0-1 drink a day for women who are not pregnant. 0-2 drinks a day for men. Know how much alcohol is in a drink. In the [place] one drink equals one 12 oz bottle of beer (355 mL), one 5 oz glass of wine (148 mL), or one 1 oz glass of hard liquor (44 mL). Do not use any products that contain nicotine or tobacco. These products include cigarettes, chewing tobacco, and vaping devices, such as e-cigarettes. If you need help quitting, ask your health care provider. Eat  a healthy diet and maintain a healthy weight. Poor diet and excess weight may make pain worse. Eat foods that are high in fiber. These include fresh fruits and vegetables, whole grains, and beans. Limit foods that are high in fat and processed sugars, such as fried and sweet foods. Exercise regularly. Exercise lowers stress and may help relieve pain. Ask your health care provider what activities and exercises are safe for you. If your health care provider approves, join an exercise class that combines movement and stress reduction. Examples include yoga and tai chi. Get enough sleep. Lack of sleep may make pain worse. Lower stress as much as possible. Practice stress reduction techniques as told by your therapist. General instructions Work with all your pain management providers to find the treatments that work best for you. You are an important member of your pain management team. There are many things you can do to reduce pain on your own. Consider joining an online or in-person support group for people who have chronic pain. Keep all follow-up visits. This is important. Where to find more information You can find more information about managing pain without opioids from: American Academy of Pain Medicine: painmed.org Institute for Chronic Pain: instituteforchronicpain.org American Chronic Pain Association: theacpa.org Contact a health care provider if: You have side effects from pain medicine. Your pain gets worse or does not get better with treatments or home therapy. You are struggling with anxiety or depression. Summary Many types of  pain can be managed without opioids. Chronic pain may respond better to pain management without opioids. Pain is best managed when you and a team of health care providers work together. Pain management without opioids may include non-opioid medicines, medical treatments, physical therapy, mental health therapy, and lifestyle changes. Tell your health care  providers if your pain gets worse or is not being managed well enough. This information is not intended to replace advice given to you by your health care provider. Make sure you discuss any questions you have with your health care provider. Document Revised: 08/05/2020 Document Reviewed: 08/05/2020 Elsevier Patient Education  2024 [employer].

## 2023-07-28 ENCOUNTER — Ambulatory Visit: Payer: PPO | Admitting: Family Medicine

## 2023-07-28 ENCOUNTER — Ambulatory Visit: Admitting: Family Medicine

## 2023-08-02 ENCOUNTER — Other Ambulatory Visit: Payer: Self-pay | Admitting: Family Medicine

## 2023-08-02 DIAGNOSIS — G894 Chronic pain syndrome: Secondary | ICD-10-CM

## 2023-08-04 ENCOUNTER — Other Ambulatory Visit: Payer: Self-pay | Admitting: Family Medicine

## 2023-08-04 DIAGNOSIS — G894 Chronic pain syndrome: Secondary | ICD-10-CM

## 2023-08-10 ENCOUNTER — Other Ambulatory Visit: Payer: Self-pay | Admitting: Orthopedic Surgery

## 2023-08-10 DIAGNOSIS — M25551 Pain in right hip: Secondary | ICD-10-CM

## 2023-08-17 ENCOUNTER — Ambulatory Visit
Admission: RE | Admit: 2023-08-17 | Discharge: 2023-08-17 | Disposition: A | Discharge status: Home / Self Care | Source: Ambulatory Visit | Attending: Orthopedic Surgery | Admitting: Orthopedic Surgery

## 2023-08-17 DIAGNOSIS — M25551 Pain in right hip: Secondary | ICD-10-CM

## 2023-08-17 MED ORDER — IOPAMIDOL (ISOVUE-M 200) INJECTION 41%
1.0000 mL | Freq: Once | INTRAMUSCULAR | Status: AC
  Administered 2023-08-17: 1 mL via INTRA_ARTICULAR

## 2023-08-17 MED ORDER — METHYLPREDNISOLONE ACETATE 40 MG/ML INJ SUSP (RADIOLOG
80.0000 mg | Freq: Once | INTRAMUSCULAR | Status: AC
  Administered 2023-08-17: 80 mg via INTRA_ARTICULAR

## 2023-08-23 ENCOUNTER — Other Ambulatory Visit: Payer: Self-pay | Admitting: Family Medicine

## 2023-08-23 DIAGNOSIS — G894 Chronic pain syndrome: Secondary | ICD-10-CM

## 2023-08-23 DIAGNOSIS — G2581 Restless legs syndrome: Secondary | ICD-10-CM

## 2023-08-23 MED ORDER — GABAPENTIN 300 MG PO CAPS: 300.0000 mg | ORAL_CAPSULE | Freq: Four times a day (QID) | ORAL | 3 refills | Status: DC | PRN

## 2023-09-29 ENCOUNTER — Ambulatory Visit (INDEPENDENT_AMBULATORY_CARE_PROVIDER_SITE_OTHER)

## 2023-09-29 ENCOUNTER — Ambulatory Visit: Payer: Self-pay | Admitting: Nurse Practitioner

## 2023-09-29 ENCOUNTER — Telehealth: Payer: Self-pay | Admitting: Internal Medicine

## 2023-09-29 ENCOUNTER — Ambulatory Visit: Payer: Self-pay | Admitting: Internal Medicine

## 2023-09-29 ENCOUNTER — Ambulatory Visit (INDEPENDENT_AMBULATORY_CARE_PROVIDER_SITE_OTHER): Admitting: Nurse Practitioner

## 2023-09-29 VITALS — BP 142/80 | HR 98 | Temp 97.8°F | Ht 63.0 in | Wt 144.0 lb

## 2023-09-29 DIAGNOSIS — J189 Pneumonia, unspecified organism: Secondary | ICD-10-CM | POA: Insufficient documentation

## 2023-09-29 MED ORDER — DOXYCYCLINE HYCLATE 100 MG PO TABS: 100.0000 mg | ORAL_TABLET | Freq: Two times a day (BID) | ORAL | 0 refills | Status: DC

## 2023-09-29 MED ORDER — PREDNISONE 20 MG PO TABS: 40.0000 mg | ORAL_TABLET | Freq: Every day | ORAL | 0 refills | Status: DC

## 2023-09-29 NOTE — Telephone Encounter (Signed)
 FYI

## 2023-09-29 NOTE — Telephone Encounter (Signed)
 Opened triage encounter - see other nurse notes from 5/23.

## 2023-09-29 NOTE — Progress Notes (Signed)
 Established Patient Office Visit  Subjective   Patient ID: [name] [name], female    DOB: 04-Aug-1935  Age: 88 y.o. MRN: [id number]  Chief Complaint  Patient presents with   Cough    [name] [name] is a 88 y/o female with PMH significant for anxiety, asthma, GERD, hyperlipidemia, hypertension, CKD stage III, osteoporosis.  She arrives today for 1 week history of productive cough and shortness of breath.  She does follow with pulmonology.  She reports no fever or chills no known sick contacts.  Also reporting malaise and overall fatigue.    Review of Systems  Constitutional:  Negative for chills and fever.  Respiratory:  Positive for cough, sputum production and shortness of breath.   Cardiovascular:  Negative for chest pain.      Objective:     BP (!) 142/80   Pulse 98   Temp 97.8 F (36.6 C) (Temporal)   Ht 5\' 3"  (1.6 m)   Wt 144 lb (65.3 kg)   SpO2 92%   BMI 25.51 kg/m  BP Readings from Last 3 Encounters:  09/29/23 (!) 142/80  07/10/23 120/80  06/30/23 118/60   Wt Readings from Last 3 Encounters:  09/29/23 144 lb (65.3 kg)  07/27/23 149 lb (67.6 kg)  07/10/23 149 lb (67.6 kg)      Physical Exam Vitals reviewed.  Constitutional:      General: She is not in acute distress.    Appearance: Normal appearance.  HENT:     Head: Normocephalic and atraumatic.  Neck:     Vascular: No carotid bruit.  Cardiovascular:     Rate and Rhythm: Normal rate and regular rhythm.     Pulses: Normal pulses.     Heart sounds: Normal heart sounds.  Pulmonary:     Effort: Pulmonary effort is normal.     Breath sounds: Rhonchi present.  Skin:    General: Skin is warm and dry.  Neurological:     General: No focal deficit present.     Mental Status: She is alert and oriented to person, place, and time.  Psychiatric:        Mood and Affect: Mood normal.        Behavior: Behavior normal.        Judgment: Judgment normal.      No results found for any visits on  09/29/23.    The ASCVD Risk score (Arnett DK, et al., 2019) failed to calculate for the following reasons:   The 2019 ASCVD risk score is only valid for ages 51 to 41    Assessment & Plan:   Problem List Items Addressed This Visit       Respiratory   Community acquired pneumonia of right lung - Primary   Acute Concern for CAP Last EKG did show borderline elongated QTc, thus we will treat with course of doxycycline  100 mg twice daily x 10 days as opposed to azithromycin  with Augmentin . Will also prescribe prednisone  40 mg by mouth with a meal every morning x 5 days. Will order x-ray as well as CBC, CMP. Have patient follow-up with provider in office next week for close monitoring.  Strict ER precautions discussed as well.      Relevant Medications   doxycycline  (VIBRA -TABS) 100 MG tablet   predniSONE  (DELTASONE ) 20 MG tablet   Other Relevant Orders   Comprehensive metabolic panel with GFR   CBC with Differential/Platelet   DG Chest 2 View    Return in about 1  week (around 10/06/2023) for F/U with PCP or other available provider for close monitoring.    Zorita Hiss, NP

## 2023-09-29 NOTE — Patient Instructions (Addendum)
 I am starting you on doxycycline  which is an antibiotic to treat pneumonia.  Take 1 tablet by mouth in the morning and in the evening for 10 full days.  I am also sending in prednisone .  Take 2 tablets by mouth every morning with a meal for total of 5 days.  If your symptoms worsen at all over the next week you need to proceed to the emergency department.  Examples include worsening shortness of breath, chest tightness, high fever, production of increased sputum.

## 2023-09-29 NOTE — Telephone Encounter (Unsigned)
 Copied from CRM [id number]. Topic: Clinical - Medication Refill >> Sep 29, 2023 11:14 AM Margarette Shawl wrote: Medication:   Prednisone  10 mg- Medication not on medication list. Pt is requesting prescription due to previous one expiring.   Has the patient contacted their pharmacy? Yes (Agent: If no, request that the patient contact the pharmacy for the refill. If patient does not wish to contact the pharmacy document the reason why and proceed with request.) (Agent: If yes, when and what did the pharmacy advise?)  This is the patient's preferred pharmacy:   [facility] - [place] - [address] [place] ([place]) [place] [address] Phone: [phone] Fax: [phone]   Is this the correct pharmacy for this prescription? Yes If no, delete pharmacy and type the correct one.   Has the prescription been filled recently? No  Is the patient out of the medication? Yes  Has the patient been seen for an appointment in the last year OR does the patient have an upcoming appointment? Yes  Can we respond through MyChart? Yes  Agent: Please be advised that Rx refills may take up to 3 business days. We ask that you follow-up with your pharmacy.

## 2023-09-29 NOTE — Telephone Encounter (Addendum)
  Copied from CRM [id number]. Topic: Clinical - Medication Refill >> Sep 29, 2023 11:14 AM Margarette Shawl wrote: Medication:    Prednisone  10 mg- Medication not on medication list. Pt is requesting prescription due to previous one expiring.    Has the patient contacted their pharmacy? Yes (Agent: If no, request that the patient contact the pharmacy for the refill. If patient does not wish to contact the pharmacy document the reason why and proceed with request.) (Agent: If yes, when and what did the pharmacy advise?)   This is the patient's preferred pharmacy:    [id number] - [place] ([place]), [place] - [address] [place] ([place])  [address] Phone: [phone] Fax: [phone]     Is this the correct pharmacy for this prescription? Yes If no, delete pharmacy and type the correct one.    Has the prescription been filled recently? No   Is the patient out of the medication? Yes   Has the patient been seen for an appointment in the last year OR does the patient have an upcoming appointment? Yes   Can we respond through MyChart? Yes   Agent: Please be advised that Rx refills may take up to 3 business days. We ask that you follow-up with your pharmacy.  TRIAGE NURSE NOTE Nurse and pt mutually greeted each other then nurse connection crashed. Another triage nurse talking to pt since pt called back in.

## 2023-09-29 NOTE — Assessment & Plan Note (Signed)
 Acute Concern for CAP Last EKG did show borderline elongated QTc, thus we will treat with course of doxycycline  100 mg twice daily x 10 days as opposed to azithromycin  with Augmentin . Will also prescribe prednisone  40 mg by mouth with a meal every morning x 5 days. Will order x-ray as well as CBC, CMP. Have patient follow-up with provider in office next week for close monitoring.  Strict ER precautions discussed as well.

## 2023-09-29 NOTE — Telephone Encounter (Signed)
 Chief Complaint: productive cough Symptoms: productive cough, wheezing, SOB Frequency: x 3 days Pertinent Negatives: Patient denies fever, CP, severe SOB Disposition: [] ED /[] Urgent Care (no appt availability in office) / [x] Appointment(In office/virtual)/ []  [facility]/ [] Home Care/ [] Refused Recommended Disposition /[] [place] [facility]/ []  Follow-up with PCP Additional Notes: Pt initially calling for prednisone  refill. Upon further investigation, pt symptomatic with productive green cough, wheezing and some SOB. Pt reports taking respiratory medications as instructed as well as albuterol . Triager reinforced albuterol  usage and instructed pt to take albuterol  NEB now. Triager does appreciate audible SOB/wheezing during call. Pt is speaking in partial-full sentences. Triager attempted to schedule acute visit at [facility], but no access, so scheduled with [facility] with alternate provider. Scheduled patient per protocol on Sep 29, 2023. Patient verbalized understanding and to call back/go to ED with worsening symptoms.        Reason for Disposition  [1] Known COPD or other severe lung disease (i.e., bronchiectasis, cystic fibrosis, lung surgery) AND [2] worsening symptoms (i.e., increased sputum purulence or amount, increased breathing difficulty  Answer Assessment - Initial Assessment Questions E2C2 Pulmonary Triage - Initial Assessment Questions "Chief Complaint (e.g., cough, sob, wheezing, fever, chills, sweat or additional symptoms) *Go to specific symptom protocol after initial questions. Requesting refill for prednisone  Recently seen Pulm and was told she would get 10 Rx refills - rx has now expired Productive green cough  "How long have symptoms been present?" x3  Have you tested for COVID or Flu? Note: If not, ask patient if a home test can be taken. If so, instruct patient to call back for positive results. No  MEDICINES:   "Have you used any OTC meds to help with  symptoms?" Yes If yes, ask "What medications?" Cough medicine Tussin - provides some relief Sinus 12 hr pills  "Have you used your inhalers/maintenance medication?" Yes If yes, "What medications?" Dulera  - 2 puffs twice a day Albuterol  INH 2 puffs  If inhaler, ask "How many puffs and how often?" Note: Review instructions on medication in the chart. See above  OXYGEN: "Do you wear supplemental oxygen?" No If yes, "How many liters are you supposed to use?" denies  "Do you monitor your oxygen levels?" Yes If yes, "What is your reading (oxygen level) today?" 93  "What is your usual oxygen saturation reading?"  (Note: Pulmonary O2 sats should be 90% or greater) 98-99   1. DRUG NAME: "What medicine do you need to have refilled?"     Prednisone  2. REFILLS REMAINING: "How many refills are remaining?" (Note: The label on the medicine or pill bottle will show how many refills are remaining. If there are no refills remaining, then a renewal may be needed.)     0 3. EXPIRATION DATE: "What is the expiration date?" (Note: The label states when the prescription will expire, and thus can no longer be refilled.)     Expired already 4. PRESCRIBING HCP: "Who prescribed it?" Reason: If prescribed by specialist, call should be referred to that group.     Wert 5. SYMPTOMS: "Do you have any symptoms?"     Productive cough, wheeze, SOB  Answer Assessment - Initial Assessment Questions 3. SPUTUM: "Describe the color of your sputum" (none, dry cough; clear, white, yellow, green)     green 4. HEMOPTYSIS: "Are you coughing up any blood?" If so ask: "How much?" (flecks, streaks, tablespoons, etc.)     denies 5. DIFFICULTY BREATHING: "Are you having difficulty breathing?" If Yes, ask: "How  bad is it?" (e.g., mild, moderate, severe)    - MILD: No SOB at rest, mild SOB with walking, speaks normally in sentences, can lie down, no retractions, pulse < 100.    - MODERATE: SOB at rest, SOB with minimal  exertion and prefers to sit, cannot lie down flat, speaks in phrases, mild retractions, audible wheezing, pulse 100-120.    - SEVERE: Very SOB at rest, speaks in single words, struggling to breathe, sitting hunched forward, retractions, pulse > 120      Mild-moderate  6. FEVER: "Do you have a fever?" If Yes, ask: "What is your temperature, how was it measured, and when did it start?"     denies  Protocols used: Medication Refill and Renewal Call-A-[facility], Cough - Acute Productive-A-AH

## 2023-10-01 ENCOUNTER — Other Ambulatory Visit: Payer: Self-pay

## 2023-10-01 ENCOUNTER — Emergency Department (HOSPITAL_COMMUNITY)
Admission: EM | Admit: 2023-10-01 | Discharge: 2023-10-01 | Disposition: A | Discharge status: Home / Self Care | Attending: Emergency Medicine | Admitting: Emergency Medicine

## 2023-10-01 ENCOUNTER — Emergency Department (HOSPITAL_COMMUNITY)

## 2023-10-01 DIAGNOSIS — J4541 Moderate persistent asthma with (acute) exacerbation: Secondary | ICD-10-CM

## 2023-10-01 DIAGNOSIS — R739 Hyperglycemia, unspecified: Secondary | ICD-10-CM | POA: Insufficient documentation

## 2023-10-01 DIAGNOSIS — J45909 Unspecified asthma, uncomplicated: Secondary | ICD-10-CM | POA: Diagnosis not present

## 2023-10-01 DIAGNOSIS — D72829 Elevated white blood cell count, unspecified: Secondary | ICD-10-CM | POA: Insufficient documentation

## 2023-10-01 DIAGNOSIS — R0602 Shortness of breath: Secondary | ICD-10-CM | POA: Diagnosis present

## 2023-10-01 DIAGNOSIS — Z8673 Personal history of transient ischemic attack (TIA), and cerebral infarction without residual deficits: Secondary | ICD-10-CM | POA: Insufficient documentation

## 2023-10-01 DIAGNOSIS — N189 Chronic kidney disease, unspecified: Secondary | ICD-10-CM | POA: Insufficient documentation

## 2023-10-01 DIAGNOSIS — I129 Hypertensive chronic kidney disease with stage 1 through stage 4 chronic kidney disease, or unspecified chronic kidney disease: Secondary | ICD-10-CM | POA: Diagnosis not present

## 2023-10-01 DIAGNOSIS — R059 Cough, unspecified: Secondary | ICD-10-CM | POA: Insufficient documentation

## 2023-10-01 DIAGNOSIS — J189 Pneumonia, unspecified organism: Secondary | ICD-10-CM

## 2023-10-01 DIAGNOSIS — I2510 Atherosclerotic heart disease of native coronary artery without angina pectoris: Secondary | ICD-10-CM | POA: Insufficient documentation

## 2023-10-01 DIAGNOSIS — Z79899 Other long term (current) drug therapy: Secondary | ICD-10-CM | POA: Insufficient documentation

## 2023-10-01 DIAGNOSIS — Z7982 Long term (current) use of aspirin: Secondary | ICD-10-CM | POA: Insufficient documentation

## 2023-10-01 LAB — COMPREHENSIVE METABOLIC PANEL WITH GFR
ALT: 14 U/L (ref 0–44)
AST: 14 U/L — ABNORMAL LOW (ref 15–41)
Albumin: 3.4 g/dL — ABNORMAL LOW (ref 3.5–5.0)
Alkaline Phosphatase: 70 U/L (ref 38–126)
Anion gap: 10 (ref 5–15)
BUN: 22 mg/dL (ref 8–23)
CO2: 22 mmol/L (ref 22–32)
Calcium: 9.3 mg/dL (ref 8.9–10.3)
Chloride: 103 mmol/L (ref 98–111)
Creatinine, Ser: 0.8 mg/dL (ref 0.44–1.00)
GFR, Estimated: >60 mL/min (ref >60)
Glucose, Bld: 157 mg/dL — ABNORMAL HIGH (ref 70–99)
Potassium: 4 mmol/L (ref 3.5–5.1)
Sodium: 135 mmol/L (ref 135–145)
Total Bilirubin: 0.8 mg/dL (ref 0.0–1.2)
Total Protein: 7 g/dL (ref 6.5–8.1)

## 2023-10-01 LAB — CBC
HCT: 43.9 % (ref 36.0–46.0)
Hemoglobin: 14.4 g/dL (ref 12.0–15.0)
MCH: 29.6 pg (ref 26.0–34.0)
MCHC: 32.8 g/dL (ref 30.0–36.0)
MCV: 90.3 fL (ref 80.0–100.0)
Platelets: 309 10*3/uL (ref 150–400)
RBC: 4.86 MIL/uL (ref 3.87–5.11)
RDW: 15.6 % — ABNORMAL HIGH (ref 11.5–15.5)
WBC: 17.5 10*3/uL — ABNORMAL HIGH (ref 4.0–10.5)
nRBC: 0 % (ref 0.0–0.2)

## 2023-10-01 LAB — RESP PANEL BY RT-PCR (RSV, FLU A&B, COVID)  RVPGX2
Influenza A by PCR: NEGATIVE
Influenza B by PCR: NEGATIVE
Resp Syncytial Virus by PCR: NEGATIVE
SARS Coronavirus 2 by RT PCR: NEGATIVE

## 2023-10-01 LAB — I-STAT CG4 LACTIC ACID, ED
Lactic Acid, Venous: 1.3 mmol/L (ref 0.5–1.9)
Lactic Acid, Venous: 1.9 mmol/L (ref 0.5–1.9)

## 2023-10-01 MED ORDER — IOHEXOL 350 MG/ML SOLN
75.0000 mL | Freq: Once | INTRAVENOUS | Status: AC | PRN
Start: 2023-10-01 — End: 2023-10-01
  Administered 2023-10-01: 75 mL via INTRAVENOUS

## 2023-10-01 MED ORDER — ALBUTEROL SULFATE HFA 108 (90 BASE) MCG/ACT IN AERS: 2.0000 | INHALATION_SPRAY | RESPIRATORY_TRACT | 0 refills | Status: AC | PRN

## 2023-10-01 MED ORDER — IPRATROPIUM-ALBUTEROL 0.5-2.5 (3) MG/3ML IN SOLN
3.0000 mL | Freq: Once | RESPIRATORY_TRACT | Status: AC
  Administered 2023-10-01: 3 mL via RESPIRATORY_TRACT
  Filled 2023-10-01: qty 3

## 2023-10-01 MED ORDER — ALBUTEROL SULFATE (2.5 MG/3ML) 0.083% IN NEBU
5.0000 mg | INHALATION_SOLUTION | Freq: Once | RESPIRATORY_TRACT | Status: AC
  Administered 2023-10-01: 5 mg via RESPIRATORY_TRACT
  Filled 2023-10-01: qty 6

## 2023-10-01 MED ORDER — METHYLPREDNISOLONE SODIUM SUCC 125 MG IJ SOLR
125.0000 mg | Freq: Once | INTRAMUSCULAR | Status: AC
  Administered 2023-10-01: 125 mg via INTRAVENOUS
  Filled 2023-10-01: qty 2

## 2023-10-01 MED ORDER — IPRATROPIUM-ALBUTEROL 0.5-2.5 (3) MG/3ML IN SOLN
3.0000 mL | Freq: Once | RESPIRATORY_TRACT | Status: AC
Start: 2023-10-01 — End: 2023-10-01
  Administered 2023-10-01: 3 mL via RESPIRATORY_TRACT
  Filled 2023-10-01: qty 3

## 2023-10-01 MED ORDER — AMOXICILLIN-POT CLAVULANATE 875-125 MG PO TABS: 1.0000 | ORAL_TABLET | Freq: Two times a day (BID) | ORAL | 0 refills | Status: DC

## 2023-10-01 MED ORDER — SODIUM CHLORIDE 0.9 % IV SOLN
1.0000 g | Freq: Once | INTRAVENOUS | Status: AC
  Administered 2023-10-01: 1 g via INTRAVENOUS
  Filled 2023-10-01: qty 10

## 2023-10-01 MED ORDER — ALBUTEROL SULFATE (2.5 MG/3ML) 0.083% IN NEBU: 2.5000 mg | INHALATION_SOLUTION | RESPIRATORY_TRACT | 2 refills | Status: AC | PRN

## 2023-10-01 NOTE — ED Provider Notes (Signed)
 [place] EMERGENCY DEPARTMENT AT [facility] Provider Note   CSN: [id number] Arrival date & time: 10/01/23  1153     History  Chief Complaint  Patient presents with   Shortness of Breath    [name] [name] is a 88 y.o. female.  Pt is a 88 yo female with pmhx significant for CAD, HLD, TIA, osteoporosis, anxiety, asthma, GERD, HTN, CKD, and osteoporosis.  Pt has ahd a cough for several days.  She was diagnosed with pna by 5/23.  She was put on doxy/prednisone  and is not improving.  Pt said she does not feel like she's improving.        Home Medications Prior to Admission medications   Medication Sig Start Date End Date Taking? Authorizing Provider  acetaminophen  (TYLENOL ) 325 MG tablet Take 2 tablets (650 mg total) by mouth every 6 (six) hours as needed for mild pain (pain score 1-3) or moderate pain (pain score 4-6) (or Fever >/= 101). 06/02/23   Marisela Sicks, PA-C  albuterol  (PROVENTIL ) (2.5 MG/3ML) 0.083% nebulizer solution Take 3 mLs (2.5 mg total) by nebulization every 4 (four) hours as needed for wheezing or shortness of breath. 01/28/22   Diamond Formica, MD  albuterol  (VENTOLIN  HFA) 108 (90 Base) MCG/ACT inhaler INHALE 2 PUFFS BY MOUTH EVERY 4 HOURS AS NEEDED FOR WHEEZING FOR SHORTNESS OF BREATH Patient taking differently: Inhale 2 puffs into the lungs as needed for wheezing or shortness of breath. 05/15/23   Diamond Formica, MD  amLODipine  (NORVASC ) 10 MG tablet Take  1 tablet Daikly for BP & Heart                                                                        /                                                                   TAKE                                         BY                                                 MOUTH Patient taking differently: Take 10 mg by mouth daily. 12/16/22   Vangie Genet, MD  aspirin  EC 81 MG tablet Take 81 mg by mouth at bedtime. Every other night    [provider]  benzonatate  (TESSALON  PERLES) 100 MG capsule Take 1  capsule (100 mg total) by mouth every 6 (six) hours as needed for cough. 05/12/23 05/11/24  Wilkinson, Dana E, FNP  budesonide  (PULMICORT ) 0.25 MG/2ML nebulizer solution Take 2 mLs (0.25 mg total) by nebulization 4 (four) times daily. 07/12/22   Diamond Formica, MD  busPIRone  (BUSPAR ) 10  MG tablet Take 1/2 to 1 tablet 3 x /day if Needed for Anxiety or Panic Attack  (( Replaces Xanax  )) 10/15/22   Vangie Genet, MD  Cholecalciferol  (VITAMIN D3) 5000 units CAPS Take 5,000 Units by mouth 2 (two) times daily.     [provider]  doxycycline  (VIBRA -TABS) 100 MG tablet Take 1 tablet (100 mg total) by mouth 2 (two) times daily. 09/29/23   Zorita Hiss, NP  gabapentin  (NEURONTIN ) 300 MG capsule Take 1 capsule (300 mg total) by mouth 4 (four) times daily as needed. 08/23/23   Wellington Half, FNP  hydrALAZINE  (APRESOLINE ) 25 MG tablet Take 1 tablet (25 mg total) by mouth 3 (three) times daily. 06/02/23   Daren Eck, DO  ketoconazole  (NIZORAL ) 2 % cream APPLY TO RINGWORM  RASH TWICE A DAY 01/18/23   Langley Pippin, NP  Magnesium  400 MG TABS Take 400 mg by mouth daily at 2 PM.    [provider]  melatonin 5 MG TABS Take 5 mg by mouth at bedtime as needed.    [provider]  mometasone -formoterol  (DULERA ) 200-5 MCG/ACT AERO Take 2 puffs first thing in am and then another 2 puffs about 12 hours later. 09/08/22   Diamond Formica, MD  nitroGLYCERIN  (NITROSTAT ) 0.4 MG SL tablet Place 1 tablet (0.4 mg total) under the tongue every 5 (five) minutes as needed for chest pain. 09/04/14   Darlis Eisenmenger, MD  pantoprazole  (PROTONIX ) 40 MG tablet Take  1 tablet  Daily  to Prevent Heartburn &  Indigestion 02/13/23   Vangie Genet, MD  polyethylene glycol (MIRALAX  / GLYCOLAX ) 17 g packet Take 17 g by mouth daily. 06/03/23   Daren Eck, DO  predniSONE  (DELTASONE ) 20 MG tablet Take 2 tablets (40 mg total) by mouth daily with breakfast. 09/29/23   Zorita Hiss, NP  promethazine  (PHENERGAN ) 25  MG tablet Take  1 tablet   4 x / day  before Meals & Bedtime  as needed for Nausea 02/13/23   Vangie Genet, MD  propranolol  (INDERAL ) 20 MG tablet Take 1 tablet (20 mg total) by mouth 2 (two) times daily. 01/23/23 01/23/24  Cranford, Tonya, NP  rOPINIRole  (REQUIP ) 2 MG tablet Take 0.5 tablets (1 mg total) by mouth at bedtime. 03/09/23   Wilkinson, Dana E, FNP  senna-docusate (SENOKOT-S) 8.6-50 MG tablet Take 2 tablets by mouth 2 (two) times daily. 12/31/19   Armenta Landau, MD  solifenacin (VESICARE) 5 MG tablet Take 5 mg by mouth daily. 07/11/22   [provider]  sucralfate  (CARAFATE ) 1 g tablet Take 1 g by mouth 4 (four) times daily -  with meals and at bedtime.    [provider]  traMADol  (ULTRAM ) 50 MG tablet Take 1 tablet (50 mg total) by mouth 2 (two) times daily as needed. 08/04/23   Wellington Half, FNP  vitamin B-12 (CYANOCOBALAMIN ) 1000 MCG tablet Take 5,000 mcg by mouth daily.    [provider]      Allergies    Ace inhibitors, Aspartame, Atorvastatin , Biaxin [clarithromycin], Daliresp [roflumilast], Erythromycin , and Levofloxacin     Review of Systems   Review of Systems  Respiratory:  Positive for cough and shortness of breath.   All other systems reviewed and are negative.   Physical Exam Updated Vital Signs BP (!) 146/89   Pulse 65   Temp 98.2 F (36.8 C)   Resp (!) 25   Ht 5\' 3"  (1.6 m)   Wt 65.3 kg  SpO2 100%   BMI 25.50 kg/m  Physical Exam Vitals and nursing note reviewed.  Constitutional:      Appearance: She is well-developed. She is ill-appearing.  HENT:     Head: Normocephalic and atraumatic.     Mouth/Throat:     Mouth: Mucous membranes are moist.     Pharynx: Oropharynx is clear.  Eyes:     Extraocular Movements: Extraocular movements intact.     Pupils: Pupils are equal, round, and reactive to light.  Cardiovascular:     Rate and Rhythm: Normal rate and regular rhythm.  Pulmonary:     Effort: Tachypnea  present.     Breath sounds: Wheezing present.  Abdominal:     General: Bowel sounds are normal.     Palpations: Abdomen is soft.  Musculoskeletal:        General: Normal range of motion.     Cervical back: Normal range of motion and neck supple.  Skin:    General: Skin is warm.     Capillary Refill: Capillary refill takes less than 2 seconds.  Neurological:     General: No focal deficit present.     Mental Status: She is alert and oriented to person, place, and time.  Psychiatric:        Mood and Affect: Mood normal.        Behavior: Behavior normal.     ED Results / Procedures / Treatments   Labs (all labs ordered are listed, but only abnormal results are displayed) Labs Reviewed  COMPREHENSIVE METABOLIC PANEL WITH GFR - Abnormal; Notable for the following components:      Result Value   Glucose, Bld 157 (*)    Albumin  3.4 (*)    AST 14 (*)    All other components within normal limits  CBC - Abnormal; Notable for the following components:   WBC 17.5 (*)    RDW 15.6 (*)    All other components within normal limits  RESP PANEL BY RT-PCR (RSV, FLU A&B, COVID)  RVPGX2  CULTURE, BLOOD (ROUTINE X 2)  CULTURE, BLOOD (ROUTINE X 2)  I-STAT CG4 LACTIC ACID, ED  I-STAT CG4 LACTIC ACID, ED    EKG EKG Interpretation Date/Time:  Sunday Oct 01 2023 12:01:31 EDT Ventricular Rate:  69 PR Interval:  148 QRS Duration:  174 QT Interval:  506 QTC Calculation: 542 R Axis:   -20  Text Interpretation: Normal sinus rhythm Non-specific intra-ventricular conduction block Minimal voltage criteria for LVH, may be normal variant ( Cornell product ) Abnormal ECG When compared with ECG of 29-May-2023 09:34, PREVIOUS ECG IS PRESENT No significant change since last tracing Confirmed by Sueellen Emery [phone]) on 10/01/2023 2:59:13 PM  Radiology DG Chest Port 1 View Result Date: 10/01/2023 CLINICAL DATA:  Shortness of breath this morning with productive cough. Recent pneumonia on antibiotics.  EXAM: PORTABLE CHEST 1 VIEW COMPARISON:  09/29/2023, 04/20/2020 FINDINGS: Somewhat lordotic technique is used. Lungs are adequately inflated without lobar consolidation or effusion. Cardiomediastinal silhouette and remainder of the exam is unchanged. IMPRESSION: No active disease. Electronically Signed   By: Roda Cirri M.D.   On: 10/01/2023 13:47    Procedures Procedures    Medications Ordered in ED Medications  methylPREDNISolone  sodium succinate (SOLU-MEDROL ) 125 mg/2 mL injection 125 mg (125 mg Intravenous Given 10/01/23 1315)  ipratropium-albuterol  (DUONEB) 0.5-2.5 (3) MG/3ML nebulizer solution 3 mL (3 mLs Nebulization Given 10/01/23 1305)  albuterol  (PROVENTIL ) (2.5 MG/3ML) 0.083% nebulizer solution 5 mg (5 mg Nebulization Given 10/01/23  1510)    ED Course/ Medical Decision Making/ A&P                                 Medical Decision Making Amount and/or Complexity of Data Reviewed Labs: ordered. Radiology: ordered.  Risk Prescription drug management.   This patient presents to the ED for concern of sob, this involves an extensive number of treatment options, and is a complaint that carries with it a high risk of complications and morbidity.  The differential diagnosis includes pna, covid/flu/rsv, bronchitis, asthma exac   Co morbidities that complicate the patient evaluation  CAD, HLD, TIA, osteoporosis, anxiety, asthma, GERD, HTN, CKD, and osteoporosis   Additional history obtained:  Additional history obtained from epic chart review External records from outside source obtained and reviewed including husband   Lab Tests:  I Ordered, and personally interpreted labs.  The pertinent results include:  covid/flu/rsv neg, cbc with wbc elevated at 17.5, cmp nl other than glucose elevated 157, lactic nl at 1.3   Imaging Studies ordered:  I ordered imaging studies including cxr and ct chest  I independently visualized and interpreted imaging which showed  CXR: No  active disease.  CT chest:  pending at shift change I agree with the radiologist interpretation   Cardiac Monitoring:  The patient was maintained on a cardiac monitor.  I personally viewed and interpreted the cardiac monitored which showed an underlying rhythm of: nsr   Medicines ordered and prescription drug management:  I ordered medication including solumedrol and nebs  for sx  Reevaluation of the patient after these medicines showed that the patient improved I have reviewed the patients home medicines and have made adjustments as needed   Test Considered:  ct   Critical Interventions:  nebs   Problem List / ED Course:  SOB:  improved somewhat.  She does not want to stay, but wants to get the CT first.  Pt signed out at shift change.   Reevaluation:  After the interventions noted above, I reevaluated the patient and found that they have :improved   Social Determinants of Health:  Lives at home   Dispostion:  Pending at shift change        Final Clinical Impression(s) / ED Diagnoses Final diagnoses:  None    Rx / DC Orders ED Discharge Orders     None         Sueellen Emery, MD 10/01/23 1541

## 2023-10-01 NOTE — ED Notes (Signed)
 Went to CT

## 2023-10-01 NOTE — ED Triage Notes (Signed)
 Pt recently dx PNA and has been taking ABX and meds. Pt feeling worse SOB this morning when she woke up. Pt has productive cough  and feels like she has been having fevers. Denies CP.

## 2023-10-01 NOTE — ED Notes (Signed)
 CCMD called to admit for cardiac monitoring.

## 2023-10-01 NOTE — ED Notes (Signed)
 Patient ambulated to bathroom, pulse ox 95% RA, EDP notified

## 2023-10-01 NOTE — ED Provider Notes (Signed)
 Patient signed out to me at 1530 by Dr. Aveline pending CT and reassessment.  In short this is an 88 year old female with a past medical history of asthma, CAD, hyperlipidemia, TIA, hypertension, GERD that presented to the emergency department with cough.  She was recently diagnosed with pneumonia and started on doxycycline  and prednisone  without significant improvement.  The patient's labs here are within normal range.  She was wheezing here on exam.  Does have a leukocytosis.  She was given Solu-Medrol , DuoNeb and albuterol  neb here.  Patient had CT PE scan performed that showed no evidence of PE, does show inflammatory changes concerning for possible atypical pneumonia.  Will be given Rocephin now and likely Augmentin  if she is able to be discharged home.  On my reevaluation she does report improvement of her symptoms but still has significant wheezing and rhonchorous breath sounds.  Will be given another DuoNeb and will have ambulatory pulse ox to determine disposition as patient states that she would prefer to be discharged home.  6:03 PM Patient ambulated well with pulse ox 95%, HR in the 80s and no significant SOB.  6:37 PM Upon reassessment, patient reports significant improvement of her symptoms, her lungs now sound clear to auscultation.  She is stable for discharge home.  Will add on Augmentin  and is recommended to continue her steroids and doxycycline  as prescribed.  She states she has follow-up with her PCP on Wednesday.  She was given strict return precautions.   Kingsley, Peggy Loge K, DO 10/01/23 Quin Brush

## 2023-10-01 NOTE — Discharge Instructions (Signed)
 You were seen in the emergency department for your shortness of breath.  He did have significant wheezing and we gave you additional steroids and nebulizer treatments in the ER.  Your workup did show an atypical pneumonia, no signs of blood clots.  We did give you a dose of antibiotics through the IV and I have given you prescription for a second antibiotic called Augmentin .  You should complete this antibiotic as well as your doxycycline  and prednisone  as prescribed.  You should use your albuterol  every 4 hours for the next 24 hours and then can return to using as needed and take your other inhalers as prescribed.  You should follow-up with your primary doctor on Wednesday as scheduled.  You should return to the emergency department if you are having fevers despite the antibiotics, worsening shortness of breath, severe chest pain or any other new or concerning symptoms.

## 2023-10-04 ENCOUNTER — Ambulatory Visit: Admitting: Physician Assistant

## 2023-10-05 ENCOUNTER — Telehealth: Payer: Self-pay | Admitting: Gastroenterology

## 2023-10-05 NOTE — Telephone Encounter (Signed)
 Patient states that she had an episode this morning of rectal bleeding. She states that this blood was only on toilet tissue and was brb. She indicates that she does have chronic issues with constipation. She denies any rectal pain, abdominal pain or other alarm symptoms. Patient had flexible sigmoidoscopy with Dr General Kenner 2024 showing some diverticulosis and internal hemorrhoids. Biopsies negative for inflammatory bowel disease/abnormality.   Patient is advised that her rectal bleeding could very likely be due to internal hemorrhoids bleeding, especially since she has issues with constipation and is not having any pain. She is advised to purchase some preparation H suppositories and use nightly x 7 nights. Also advised she may purchase some hydrocortisone cream OTC to use as needed. High fiber diet, increase water intake recommended as well.  Patient has been scheduled to see Everett Hitt, NP on 10/30/23 for reassurance and additional recommendations if needed.   Patient is advised that she should contact us  again should her bleeding significantly increase, she develops abdominal pain, rectal pain,  dizziness, increased fatigue or additional symptoms prior to her visit. She verbalizes understanding.

## 2023-10-05 NOTE — Telephone Encounter (Signed)
 Inbound call from patient stating she woke up with rectal bleeding this morning. Patient is currently scheduled for August. Patient is requesting a call to discuss options to help sooner. Please advise, thank you.

## 2023-10-06 ENCOUNTER — Ambulatory Visit: Payer: Self-pay | Admitting: Family Medicine

## 2023-10-06 ENCOUNTER — Encounter: Payer: Self-pay | Admitting: Family Medicine

## 2023-10-06 ENCOUNTER — Ambulatory Visit: Admitting: Family Medicine

## 2023-10-06 VITALS — BP 132/70 | HR 62 | Temp 98.1°F | Ht 63.0 in | Wt 145.0 lb

## 2023-10-06 DIAGNOSIS — D72828 Other elevated white blood cell count: Secondary | ICD-10-CM

## 2023-10-06 DIAGNOSIS — J418 Mixed simple and mucopurulent chronic bronchitis: Secondary | ICD-10-CM | POA: Diagnosis not present

## 2023-10-06 DIAGNOSIS — Z6825 Body mass index (BMI) 25.0-25.9, adult: Secondary | ICD-10-CM

## 2023-10-06 DIAGNOSIS — J189 Pneumonia, unspecified organism: Secondary | ICD-10-CM

## 2023-10-06 DIAGNOSIS — N1831 Chronic kidney disease, stage 3a: Secondary | ICD-10-CM

## 2023-10-06 DIAGNOSIS — E663 Overweight: Secondary | ICD-10-CM

## 2023-10-06 LAB — CULTURE, BLOOD (ROUTINE X 2)
Culture: NO GROWTH
Culture: NO GROWTH
Special Requests: ADEQUATE

## 2023-10-06 LAB — COMPREHENSIVE METABOLIC PANEL WITH GFR
ALT: 32 U/L (ref 0–35)
AST: 22 U/L (ref 0–37)
Albumin: 3.7 g/dL (ref 3.5–5.2)
Alkaline Phosphatase: 75 U/L (ref 39–117)
BUN: 26 mg/dL — ABNORMAL HIGH (ref 6–23)
CO2: 26 meq/L (ref 19–32)
Calcium: 9.4 mg/dL (ref 8.4–10.5)
Chloride: 102 meq/L (ref 96–112)
Creatinine, Ser: 0.9 mg/dL (ref 0.40–1.20)
GFR: 58.12 mL/min — ABNORMAL LOW (ref >60.00)
Glucose, Bld: 107 mg/dL — ABNORMAL HIGH (ref 70–99)
Potassium: 4.5 meq/L (ref 3.5–5.1)
Sodium: 137 meq/L (ref 135–145)
Total Bilirubin: 0.4 mg/dL (ref 0.2–1.2)
Total Protein: 6.8 g/dL (ref 6.0–8.3)

## 2023-10-06 LAB — CBC WITH DIFFERENTIAL/PLATELET
Basophils Absolute: 0 10*3/uL (ref 0.0–0.1)
Basophils Relative: 0.2 % (ref 0.0–3.0)
Eosinophils Absolute: 0.1 10*3/uL (ref 0.0–0.7)
Eosinophils Relative: 0.6 % (ref 0.0–5.0)
HCT: 43.1 % (ref 36.0–46.0)
Hemoglobin: 14.1 g/dL (ref 12.0–15.0)
Lymphocytes Relative: 30.1 % (ref 12.0–46.0)
Lymphs Abs: 3.3 10*3/uL (ref 0.7–4.0)
MCHC: 32.8 g/dL (ref 30.0–36.0)
MCV: 88.9 fl (ref 78.0–100.0)
Monocytes Absolute: 0.9 10*3/uL (ref 0.1–1.0)
Monocytes Relative: 7.9 % (ref 3.0–12.0)
Neutro Abs: 6.8 10*3/uL (ref 1.4–7.7)
Neutrophils Relative %: 61.2 % (ref 43.0–77.0)
Platelets: 341 10*3/uL (ref 150.0–400.0)
RBC: 4.84 Mil/uL (ref 3.87–5.11)
RDW: 16.4 % — ABNORMAL HIGH (ref 11.5–15.5)
WBC: 11.1 10*3/uL — ABNORMAL HIGH (ref 4.0–10.5)

## 2023-10-06 NOTE — Patient Instructions (Signed)
 I am sending you home with a sputum culture cup.  What ever you can cough up, lets get it in that cup so we can see which type of pneumonia you have.  I am doing labs that will come back to me very quickly, if your white blood cell count is any higher than it was when you are in the hospital, I think that you would need to go back for further evaluation and possible IV antibiotics.  If you do not need to go back to the hospital, continue the Augmentin  that they prescribed, and get the sputum culture back to me as soon as you can so we can figure out what germ is causing you to be so sick.  Follow-up with pulmonology as scheduled.  Continue to use nebulizers 4 times a day.  If your shortness of breath gets any worse, if you develop a fever, if you develop further weakness or dizziness, I want you to go straight to the ER for further evaluation and workup.

## 2023-10-06 NOTE — Progress Notes (Signed)
 Acute Office Visit  Subjective:     Patient ID: [name] [name], female    DOB: 11/26/1935, 88 y.o.   MRN: [id number]  Chief Complaint  Patient presents with   Hospitalization Follow-up    Seen in ED for Asthma. Is still wheezing     HPI Patient is in today for follow up of pneumonia. Was seen in this office on 09/29/2023 and treated with doxycycline  and prednisone .  Was not having any symptom improvement with this.  Reports that she was seen in the ED at [facility] on 10/01/2023. Had chest CT, no evidence of PE, possible MAI on CT. Treated with IV ceftriaxone , nebulizers, prednisone , oral doxycycline  and Augmentin . ER notes and results reviewed by me.  Reports that she to feel better. She is accompanied by her husband who is supplementing history today. Still taking Augmentin . Feeling fatigued and weak but improved.  Denies nausea, vomiting, diarrhea, rash, fever, chills, other symptoms.    ROS Per HPI      Objective:    BP 132/70 (BP Location: Left Arm, Patient Position: Sitting)   Pulse 62   Temp 98.1 F (36.7 C) (Temporal)   Ht 5\' 3"  (1.6 m)   Wt 145 lb (65.8 kg)   SpO2 91%   BMI 25.69 kg/m    Physical Exam Vitals and nursing note reviewed.  Constitutional:      General: She is not in acute distress.    Appearance: She is ill-appearing.     Comments: Appears fatigued   HENT:     Head: Normocephalic and atraumatic.     Nose: Congestion present.     Mouth/Throat:     Comments: Oropharyngeal cobblestoning   Eyes:     Extraocular Movements: Extraocular movements intact.     Pupils: Pupils are equal, round, and reactive to light.  Cardiovascular:     Rate and Rhythm: Normal rate and regular rhythm.     Heart sounds: Normal heart sounds.  Pulmonary:     Effort: Pulmonary effort is normal. No respiratory distress.     Breath sounds: Rhonchi present. No wheezing or rales.  Musculoskeletal:     Cervical back: Normal range of motion.     Comments:  Using 2 wheel walker, favoring R leg. R anterior hip tender, no erythema, no bruising, no obvious deformity.  Lymphadenopathy:     Cervical: Cervical adenopathy present.  Skin:    General: Skin is warm and dry.  Neurological:     General: No focal deficit present.     Mental Status: She is alert and oriented to person, place, and time.  Psychiatric:        Mood and Affect: Mood normal.        Thought Content: Thought content normal.    Results for orders placed or performed in visit on 10/06/23  Comprehensive metabolic panel with GFR  Result Value Ref Range   Sodium 137 135 - 145 mEq/L   Potassium 4.5 3.5 - 5.1 mEq/L   Chloride 102 96 - 112 mEq/L   CO2 26 19 - 32 mEq/L   Glucose, Bld 107 (H) 70 - 99 mg/dL   BUN 26 (H) 6 - 23 mg/dL   Creatinine, Ser 6.29 0.40 - 1.20 mg/dL   Total Bilirubin 0.4 0.2 - 1.2 mg/dL   Alkaline Phosphatase 75 39 - 117 U/L   AST 22 0 - 37 U/L   ALT 32 0 - 35 U/L   Total Protein 6.8 6.0 -  8.3 g/dL   Albumin  3.7 3.5 - 5.2 g/dL   GFR 09.81 (L) >19.14 mL/min   Calcium  9.4 8.4 - 10.5 mg/dL  CBC with Differential/Platelet  Result Value Ref Range   WBC 11.1 (H) 4.0 - 10.5 K/uL   RBC 4.84 3.87 - 5.11 Mil/uL   Hemoglobin 14.1 12.0 - 15.0 g/dL   HCT 78.2 95.6 - 21.3 %   MCV 88.9 78.0 - 100.0 fl   MCHC 32.8 30.0 - 36.0 g/dL   RDW 08.6 (H) 57.8 - 46.9 %   Platelets 341.0 150.0 - 400.0 K/uL   Neutrophils Relative % 61.2 43.0 - 77.0 %   Lymphocytes Relative 30.1 12.0 - 46.0 %   Monocytes Relative 7.9 3.0 - 12.0 %   Eosinophils Relative 0.6 0.0 - 5.0 %   Basophils Relative 0.2 0.0 - 3.0 %   Neutro Abs 6.8 1.4 - 7.7 K/uL   Lymphs Abs 3.3 0.7 - 4.0 K/uL   Monocytes Absolute 0.9 0.1 - 1.0 K/uL   Eosinophils Absolute 0.1 0.0 - 0.7 K/uL   Basophils Absolute 0.0 0.0 - 0.1 K/uL        Assessment & Plan:   Community acquired pneumonia, unspecified laterality Assessment & Plan: CBC, CMP, sputum culture ordered Continue Augmentin  to completion  Orders: -      Respiratory or Resp and Sputum Culture; Future -     CBC with Differential/Platelet; Future -     Comprehensive metabolic panel with GFR; Future  Other elevated white blood cell (WBC) count Assessment & Plan: CBC today   Orders: -     CBC with Differential/Platelet; Future  CKD stage 3a, GFR 45-59 ml/min (HCC) Assessment & Plan: CMP today Push fluids   Mixed simple and mucopurulent chronic bronchitis (HCC) Assessment & Plan: Follow-up with pulmonology as scheduled Continue nebulizer treatment every 4-6 hours as needed for shortness of breath Continue prednisone    Overweight with body mass index (BMI) of 25 to 25.9 in adult Assessment & Plan: Continue current efforts in diet and activity level      No orders of the defined types were placed in this encounter.   Return if symptoms worsen or fail to improve, for As scheduled with me, as scheduled with pulmonology.  Wellington Half, FNP

## 2023-10-08 ENCOUNTER — Encounter: Payer: Self-pay | Admitting: Family Medicine

## 2023-10-08 DIAGNOSIS — E663 Overweight: Secondary | ICD-10-CM | POA: Insufficient documentation

## 2023-10-08 DIAGNOSIS — N1831 Chronic kidney disease, stage 3a: Secondary | ICD-10-CM | POA: Insufficient documentation

## 2023-10-08 NOTE — Assessment & Plan Note (Signed)
 CBC today.

## 2023-10-08 NOTE — Assessment & Plan Note (Signed)
 Follow-up with pulmonology as scheduled Continue nebulizer treatment every 4-6 hours as needed for shortness of breath Continue prednisone 

## 2023-10-08 NOTE — Assessment & Plan Note (Signed)
 CBC, CMP, sputum culture ordered Continue Augmentin  to completion

## 2023-10-08 NOTE — Assessment & Plan Note (Signed)
 Continue current efforts in diet and activity level

## 2023-10-08 NOTE — Assessment & Plan Note (Signed)
 CMP today Push fluids

## 2023-10-09 ENCOUNTER — Other Ambulatory Visit: Payer: Self-pay | Admitting: Internal Medicine

## 2023-10-10 LAB — RESPIRATORY CULTURE OR RESPIRATORY AND SPUTUM CULTURE

## 2023-10-10 NOTE — Progress Notes (Deleted)
   Established Patient Office Visit  Subjective   Patient ID: [name] [name], female    DOB: 05-Sep-1935  Age: 88 y.o. MRN: [id number]  No chief complaint on file.   HPI Patient presents today for medication management. Reports compliance with medication regimen. Denies the need for refills. Not fasting today. Denies other concerns. Medical history as outlined below.  ROS Per HPI    Objective:     There were no vitals taken for this visit.  Physical Exam Vitals and nursing note reviewed.  Constitutional:      General: She is not in acute distress.    Appearance: Normal appearance. She is normal weight.  HENT:     Head: Normocephalic and atraumatic.     Nose: Nose normal.  Eyes:     Extraocular Movements: Extraocular movements intact.     Pupils: Pupils are equal, round, and reactive to light.  Cardiovascular:     Rate and Rhythm: Normal rate and regular rhythm.     Heart sounds: Normal heart sounds.  Pulmonary:     Effort: Pulmonary effort is normal. No respiratory distress.     Breath sounds: Normal breath sounds. No wheezing, rhonchi or rales.  Musculoskeletal:     Cervical back: Normal range of motion.     Comments: Using 2 wheel walker, favoring R leg. R anterior hip tender, no erythema, no bruising, no obvious deformity.  Lymphadenopathy:     Cervical: No cervical adenopathy.  Neurological:     General: No focal deficit present.     Mental Status: She is alert and oriented to person, place, and time.  Psychiatric:        Mood and Affect: Mood normal.        Thought Content: Thought content normal.    No results found for any visits on 10/12/23.   The ASCVD Risk score (Arnett DK, et al., 2019) failed to calculate for the following reasons:   The 2019 ASCVD risk score is only valid for ages 14 to 35    Assessment & Plan:   There are no diagnoses linked to this encounter.   No follow-ups on file.    Wellington Half, FNP

## 2023-10-11 ENCOUNTER — Ambulatory Visit: Admitting: Family Medicine

## 2023-10-12 ENCOUNTER — Other Ambulatory Visit: Payer: Self-pay | Admitting: Internal Medicine

## 2023-10-12 ENCOUNTER — Ambulatory Visit: Admitting: Family Medicine

## 2023-10-12 DIAGNOSIS — E611 Iron deficiency: Secondary | ICD-10-CM

## 2023-10-12 DIAGNOSIS — J189 Pneumonia, unspecified organism: Secondary | ICD-10-CM

## 2023-10-12 DIAGNOSIS — D472 Monoclonal gammopathy: Secondary | ICD-10-CM

## 2023-10-12 DIAGNOSIS — N1831 Chronic kidney disease, stage 3a: Secondary | ICD-10-CM

## 2023-10-12 DIAGNOSIS — E559 Vitamin D deficiency, unspecified: Secondary | ICD-10-CM

## 2023-10-12 DIAGNOSIS — F3341 Major depressive disorder, recurrent, in partial remission: Secondary | ICD-10-CM

## 2023-10-12 DIAGNOSIS — J42 Unspecified chronic bronchitis: Secondary | ICD-10-CM

## 2023-10-12 NOTE — Telephone Encounter (Signed)
 Copied from CRM [id number]. Topic: Clinical - Medication Refill >> Oct 12, 2023  3:46 PM Trevor Fudge R wrote: Medication: albuterol  (PROVENTIL ) (2.5 MG/3ML) 0.083% nebulizer solution  budesonide  (PULMICORT ) 0.25 MG/2ML nebulizer solution  Has the patient contacted their pharmacy? Refills expired (Agent: If no, request that the patient contact the pharmacy for the refill. If patient does not wish to contact the pharmacy document the reason why and proceed with request.) (Agent: If yes, when and what did the pharmacy advise?)  This is the patient's preferred pharmacy:  [id number] - [place] ([place] - [address] [place] ([place]) [place] [address] Phone: [phone] Fax: [phone]    Is this the correct pharmacy for this prescription? Yes If no, delete pharmacy and type the correct one.   Has the prescription been filled recently? Yes  Is the patient out of the medication? 1 left  Has the patient been seen for an appointment in the last year OR does the patient have an upcoming appointment? Yes  Can we respond through MyChart? No  Agent: Please be advised that Rx refills may take up to 3 business days. We ask that you follow-up with your pharmacy.

## 2023-10-19 ENCOUNTER — Ambulatory Visit: Admitting: Family Medicine

## 2023-10-19 ENCOUNTER — Encounter: Payer: Self-pay | Admitting: Family Medicine

## 2023-10-19 ENCOUNTER — Ambulatory Visit: Payer: Self-pay | Admitting: Family Medicine

## 2023-10-19 VITALS — BP 110/62 | HR 65 | Temp 97.8°F | Ht 63.0 in | Wt 144.2 lb

## 2023-10-19 DIAGNOSIS — D692 Other nonthrombocytopenic purpura: Secondary | ICD-10-CM | POA: Diagnosis not present

## 2023-10-19 DIAGNOSIS — N1831 Chronic kidney disease, stage 3a: Secondary | ICD-10-CM | POA: Diagnosis not present

## 2023-10-19 DIAGNOSIS — I10 Essential (primary) hypertension: Secondary | ICD-10-CM | POA: Diagnosis not present

## 2023-10-19 DIAGNOSIS — E782 Mixed hyperlipidemia: Secondary | ICD-10-CM | POA: Diagnosis not present

## 2023-10-19 DIAGNOSIS — E559 Vitamin D deficiency, unspecified: Secondary | ICD-10-CM

## 2023-10-19 DIAGNOSIS — F3341 Major depressive disorder, recurrent, in partial remission: Secondary | ICD-10-CM | POA: Diagnosis not present

## 2023-10-19 DIAGNOSIS — Z79899 Other long term (current) drug therapy: Secondary | ICD-10-CM | POA: Diagnosis not present

## 2023-10-19 LAB — COMPREHENSIVE METABOLIC PANEL WITH GFR
ALT: 14 U/L (ref 0–35)
AST: 13 U/L (ref 0–37)
Albumin: 3.9 g/dL (ref 3.5–5.2)
Alkaline Phosphatase: 79 U/L (ref 39–117)
BUN: 14 mg/dL (ref 6–23)
CO2: 27 meq/L (ref 19–32)
Calcium: 9.5 mg/dL (ref 8.4–10.5)
Chloride: 104 meq/L (ref 96–112)
Creatinine, Ser: 0.86 mg/dL (ref 0.40–1.20)
GFR: 61.37 mL/min (ref >60.00)
Glucose, Bld: 113 mg/dL — ABNORMAL HIGH (ref 70–99)
Potassium: 3.8 meq/L (ref 3.5–5.1)
Sodium: 141 meq/L (ref 135–145)
Total Bilirubin: 0.6 mg/dL (ref 0.2–1.2)
Total Protein: 6.9 g/dL (ref 6.0–8.3)

## 2023-10-19 LAB — LIPID PANEL
Cholesterol: 161 mg/dL (ref 0–200)
HDL: 51.3 mg/dL (ref >39.00)
LDL Cholesterol: 84 mg/dL (ref 0–99)
NonHDL: 109.66
Total CHOL/HDL Ratio: 3
Triglycerides: 130 mg/dL (ref 0.0–149.0)
VLDL: 26 mg/dL (ref 0.0–40.0)

## 2023-10-19 LAB — CBC WITH DIFFERENTIAL/PLATELET
Basophils Absolute: 0.1 10*3/uL (ref 0.0–0.1)
Basophils Relative: 0.7 % (ref 0.0–3.0)
Eosinophils Absolute: 0.2 10*3/uL (ref 0.0–0.7)
Eosinophils Relative: 2.7 % (ref 0.0–5.0)
HCT: 41.7 % (ref 36.0–46.0)
Hemoglobin: 13.7 g/dL (ref 12.0–15.0)
Lymphocytes Relative: 19.7 % (ref 12.0–46.0)
Lymphs Abs: 1.5 10*3/uL (ref 0.7–4.0)
MCHC: 32.9 g/dL (ref 30.0–36.0)
MCV: 90 fl (ref 78.0–100.0)
Monocytes Absolute: 0.7 10*3/uL (ref 0.1–1.0)
Monocytes Relative: 9 % (ref 3.0–12.0)
Neutro Abs: 5 10*3/uL (ref 1.4–7.7)
Neutrophils Relative %: 67.9 % (ref 43.0–77.0)
Platelets: 379 10*3/uL (ref 150.0–400.0)
RBC: 4.64 Mil/uL (ref 3.87–5.11)
RDW: 17.4 % — ABNORMAL HIGH (ref 11.5–15.5)
WBC: 7.4 10*3/uL (ref 4.0–10.5)

## 2023-10-19 LAB — VITAMIN D 25 HYDROXY (VIT D DEFICIENCY, FRACTURES): VITD: 48.87 ng/mL (ref 30.00–100.00)

## 2023-10-19 MED ORDER — AMLODIPINE BESYLATE 10 MG PO TABS
ORAL_TABLET | ORAL | 3 refills | Status: AC
Start: 2023-10-19 — End: ?

## 2023-10-19 MED ORDER — PROPRANOLOL HCL 20 MG PO TABS
20.0000 mg | ORAL_TABLET | Freq: Two times a day (BID) | ORAL | 3 refills | Status: AC
Start: 2023-10-19 — End: 2024-10-18

## 2023-10-19 NOTE — Assessment & Plan Note (Signed)
Stable , reassured

## 2023-10-19 NOTE — Assessment & Plan Note (Signed)
Lipids today

## 2023-10-19 NOTE — Assessment & Plan Note (Signed)
 CMP today

## 2023-10-19 NOTE — Patient Instructions (Signed)
 We are checking labs today, will be in contact with any results that require further attention  Refills sent in today

## 2023-10-19 NOTE — Assessment & Plan Note (Signed)
 CBC, CMP today Refill propranolol , amlodipine 

## 2023-10-19 NOTE — Assessment & Plan Note (Signed)
 Stable

## 2023-10-19 NOTE — Assessment & Plan Note (Signed)
 Labs today, will dose adjust as needed

## 2023-10-19 NOTE — Assessment & Plan Note (Signed)
 Vitamin D  level today, continue supplementation

## 2023-10-19 NOTE — Progress Notes (Signed)
 Established Patient Office Visit  Subjective   Patient ID: [name] [name], female    DOB: 04/25/1936  Age: 88 y.o. MRN: [id number]  Chief Complaint  Patient presents with   Follow-up    HPI Patient presents today for medication management. Reports compliance with medication regimen. Requesting refills of propranolol  and amlodipine  today. Not fasting today. States she is feeling much better than she was at her last visit, has recovered well from pneumonia. Denies other concerns. Medical history as outlined below.  ROS Per HPI    Objective:     BP 110/62 (BP Location: Left Arm, Patient Position: Sitting)   Pulse 65   Temp 97.8 F (36.6 C) (Temporal)   Ht 5' 3 (1.6 m)   Wt 144 lb 3.2 oz (65.4 kg)   SpO2 98%   BMI 25.54 kg/m   Physical Exam Vitals and nursing note reviewed.  Constitutional:      General: She is not in acute distress.    Appearance: Normal appearance. She is normal weight.  HENT:     Head: Normocephalic and atraumatic.     Nose: Nose normal.   Eyes:     Extraocular Movements: Extraocular movements intact.     Pupils: Pupils are equal, round, and reactive to light.   Neck:     Vascular: No carotid bruit.   Cardiovascular:     Rate and Rhythm: Normal rate and regular rhythm.     Heart sounds: Normal heart sounds.  Pulmonary:     Effort: Pulmonary effort is normal. No respiratory distress.     Breath sounds: Normal breath sounds. No wheezing, rhonchi or rales.   Musculoskeletal:        General: Normal range of motion.     Cervical back: Normal range of motion.     Comments: Using 2 wheel walker, favoring R leg.   Lymphadenopathy:     Cervical: No cervical adenopathy.   Neurological:     General: No focal deficit present.     Mental Status: She is alert and oriented to person, place, and time.   Psychiatric:        Mood and Affect: Mood normal.        Thought Content: Thought content normal.     No results found for any visits on  10/19/23.   The ASCVD Risk score (Arnett DK, et al., 2019) failed to calculate for the following reasons:   The 2019 ASCVD risk score is only valid for ages 60 to 63    Assessment & Plan:   Recurrent major depressive disorder, in partial remission (HCC) Assessment & Plan: Stable  Orders: -     CBC with Differential/Platelet  Essential hypertension Assessment & Plan: CBC, CMP today Refill propranolol , amlodipine   Orders: -     CBC with Differential/Platelet -     Comprehensive metabolic panel with GFR -     Propranolol  HCl; Take 1 tablet (20 mg total) by mouth 2 (two) times daily.  Dispense: 180 tablet; Refill: 3 -     amLODIPine  Besylate; Take  1 tablet Daikly for BP & Heart                                                                        /  TAKE                                         BY                                                 MOUTH  Dispense: 90 tablet; Refill: 3  CKD stage 3a, GFR 45-59 ml/min (HCC) Assessment & Plan: CMP today  Orders: -     CBC with Differential/Platelet -     Comprehensive metabolic panel with GFR  Vitamin D  deficiency Assessment & Plan: Vitamin D  level today, continue supplementation  Orders: -     VITAMIN D  25 Hydroxy (Vit-D Deficiency, Fractures)  Hyperlipidemia, mixed Assessment & Plan: Lipids today  Orders: -     Lipid panel  Medication management Assessment & Plan: Labs today, will dose adjust as needed  Orders: -     CBC with Differential/Platelet -     Comprehensive metabolic panel with GFR -     Lipid panel -     VITAMIN D  25 Hydroxy (Vit-D Deficiency, Fractures) -     Propranolol  HCl; Take 1 tablet (20 mg total) by mouth 2 (two) times daily.  Dispense: 180 tablet; Refill: 3 -     amLODIPine  Besylate; Take  1 tablet Daikly for BP & Heart                                                                        /                                                                    TAKE                                         BY                                                 MOUTH  Dispense: 90 tablet; Refill: 3  Senile purpura (HCC) Assessment & Plan: Stable, reassured      Return in about 3 months (around 01/19/2024) for Meds, labs.    Wellington Half, FNP

## 2023-10-20 DIAGNOSIS — N3000 Acute cystitis without hematuria: Secondary | ICD-10-CM | POA: Diagnosis not present

## 2023-10-25 DIAGNOSIS — S72301D Unspecified fracture of shaft of right femur, subsequent encounter for closed fracture with routine healing: Secondary | ICD-10-CM | POA: Diagnosis not present

## 2023-10-25 DIAGNOSIS — M79651 Pain in right thigh: Secondary | ICD-10-CM | POA: Diagnosis not present

## 2023-10-27 NOTE — Progress Notes (Unsigned)
 10/27/2023 [name] [name] [id number] [birth year]   Chief Complaint: Rectal bleeding   History of Present Illness: [name] [name]. Tocco is an 88 year old female with a past medical history of anxiety, hypertension, hyperlipidemia, CAD s/p PTCA 2003, TIA, asthma, CKD stage III, GERD, dysphagia, paraesophageal hernia status post fundoplication and colon polyps. She is known by Dr. General Kenner.     Latest Ref Rng & Units 10/19/2023   12:10 PM 10/06/2023   11:29 AM 10/01/2023   12:34 PM  CBC  WBC 4.0 - 10.5 K/uL 7.4  11.1  17.5   Hemoglobin 12.0 - 15.0 g/dL 40.9  81.1  91.4   Hematocrit 36.0 - 46.0 % 41.7  43.1  43.9   Platelets 150.0 - 400.0 K/uL 379.0  341.0  309         Latest Ref Rng & Units 10/19/2023   12:10 PM 10/06/2023   11:29 AM 10/01/2023   12:34 PM  CMP  Glucose 70 - 99 mg/dL 782  956  213   BUN 6 - 23 mg/dL 14  26  22    Creatinine 0.40 - 1.20 mg/dL 0.86  5.78  4.69   Sodium 135 - 145 mEq/L 141  137  135   Potassium 3.5 - 5.1 mEq/L 3.8  4.5  4.0   Chloride 96 - 112 mEq/L 104  102  103   CO2 19 - 32 mEq/L 27  26  22    Calcium  8.4 - 10.5 mg/dL 9.5  9.4  9.3   Total Protein 6.0 - 8.3 g/dL 6.9  6.8  7.0   Total Bilirubin 0.2 - 1.2 mg/dL 0.6  0.4  0.8   Alkaline Phos 39 - 117 U/L 79  75  70   AST 0 - 37 U/L 13  22  14    ALT 0 - 35 U/L 14  32  14     ECHO 10/07/2022: Fourth left ventricular ejection fraction, by estimation, is 55 to 60%. The left ventricle has normal function. The left ventricle has no regional wall motion abnormalities. There is mild concentric left ventricular hypertrophy. Left ventricular diastolic parameters are consistent with Grade I diastolic dysfunction (impaired relaxation). 1. Right ventricular systolic function is normal. The right ventricular size is normal. There is normal pulmonary artery systolic pressure. The estimated right ventricular systolic pressure is 27.0 mmHg. 2. 3. Left atrial size was mild to moderately dilated. The mitral valve is normal  in structure. Trivial mitral valve regurgitation. No evidence of mitral stenosis. 4. The aortic valve is tricuspid. There is mild calcification of the aortic valve. Aortic valve regurgitation is not visualized. No aortic stenosis is present. 5. The inferior vena cava is normal in size with greater than 50% respiratory variability, suggesting right atrial pressure of 3 mmHg.   Past Medical History:  Diagnosis Date   Anxiety    takes Xanax  daily as needed   Asthma    Albuterol  daily as needed   Blood transfusion without reported diagnosis    CAD (coronary artery disease)    LHC 2003 with 40% pLAD, 30% mLAD, 100% D1 (moderate vessel), 100%  D2 (small vessel), 95% mid small OM2.  Pt had PTCA to D1;  D2 and OM2 small vessels and tx medically;  Last Myoview  (2012): anterior infarct seen with scar, no ischemia. EF normal. Med Rx // Myoview  (12/14):  Low risk; ant scar w/ peri-infarct ischemia; EF 55% with ant AK // Myoview  5/16: EF 55-65, ant, apical scar, no ischemia  Cataract    Chronic back pain    HNP, DDD, spinal stenosis, vertebral compression fractures.    Chronic nausea    takes Zofran  daily as needed.  normal gastric emptying study 03/2013   CKD (chronic kidney disease), stage III (HCC) 09/27/2017   Constipation    felt to be functional. takes Senokot and Miralax  daily.  treated with Linzess  in past.    DIVERTICULOSIS, COLON 08/28/2008   Qualifier: Diagnosis of  By: Micael Adas, [name], [name]     Elevated LFTs 2015   probably med induced DILI, from Augmentin  vs Pravastatin    GERD (gastroesophageal reflux disease)    hx of esophageal stricture    Hiatal hernia    Paraesophageal hernias. s/p fundoplications in 2013 and 04/2013   History of bronchitis several of yrs ago   History of colon polyps 03/2009, 03/2011   adenomatous colon polyps, no high grade dysplasia.    History of vertigo    no meds   Hyperlipidemia    takes Pravastatin  daily   Hypertension    takes Amlodipine ,Metoprolol ,and  Losartan  daily   Multiple closed pelvic fractures without disruption of pelvic circle (HCC)    Nausea 03/2016   Osteoporosis    PONV (postoperative nausea and vomiting)    yrs ago   Pubic ramus fracture (HCC) 12/20/2019   Slow urinary stream    takes Rapaflo daily   TIA (transient ischemic attack) 1995   no residual effects noted   Vertebral compression fracture (HCC) 06/23/2012   Past Surgical History:  Procedure Laterality Date   APPENDECTOMY     patient unsure of date   BACK SURGERY     BLADDER REPAIR     CHOLECYSTECTOMY     Patient unsure of date.   CORONARY ANGIOPLASTY  11/16/2001   1 stent   ESOPHAGEAL MANOMETRY N/A 03/25/2013   Procedure: ESOPHAGEAL MANOMETRY (EM);  Surgeon: Claudette Cue, MD;  Location: [facility] END[facility]COPY;  Service: Endoscopy;  Laterality: N/A;   ESOPHAGOGASTRODUODENOSCOPY N/A 03/15/2013   Procedure: ESOPHAGOGASTRODUODENOSCOPY (EGD);  Surgeon: Nannette Babe, MD;  Location[facility] END[facility]COPY;  Service: Endoscopy;  Laterality: N/A;   ESOPHAGOGASTRODUODENOSCOPY N/A 12/25/2015   Procedure: ESOPHAGOGASTRODUODENOSCOPY (EGD);  Surgeon: Albertina Hugger, MD;  Location: [facility] ENDOSCOPY;  Service: Endoscopy;  Laterality: N/A;   EYE SURGERY  11/2000   bilateral cataracts with lens implant   FIXATION KYPHOPLASTY LUMBAR SPINE  X 2   L3-4   HEMORRHOID SURGERY     HIATAL HERNIA REPAIR  06/03/2011   Procedure: LAPAROSCOPIC REPAIR OF HIATAL HERNIA;  Surgeon: Levert Ready, MD;  Location: [facility];  Service: General;  Laterality: N/A;   HIATAL HERNIA REPAIR N/A 04/24/2013   Procedure: LAPAROSCOPIC  REPAIR RECURRENT PARASOPHAGEAL HIATAL HERNIA WITH FUNDOPLICATION;  Surgeon: Eddye Goodie, MD;  Location: [facility];  Service: General;  Laterality: N/A;   INSERTION OF MESH N/A 04/24/2013   Procedure: INSERTION OF MESH;  Surgeon: Eddye Goodie, MD;  Location: [facility];  Service: General;  Laterality: N/A;   INTRAMEDULLARY (IM) NAIL INTERTROCHANTERIC Right 05/30/2023   Procedure:  INTRAMEDULLARY (IM) NAIL INTERTROCHANTERIC;  Surgeon: Hardy Lia, MD;  Location: [facility] OR;  Service: Orthopedics;  Laterality: Right;   IR KYPHO THORACIC WITH BONE BIOPSY  09/29/2017   IR KYPHO THORACIC WITH BONE BIOPSY  11/03/2017   IR KYPHO THORACIC WITH BONE BIOPSY  02/14/2019   IR KYPHO THORACIC WITH BONE BIOPSY  04/02/2019   LAPAROSCOPIC LYSIS OF ADHESIONS N/A 04/24/2013   Procedure: LAPAROSCOPIC  LYSIS OF ADHESIONS;  Surgeon: Eddye Goodie, MD;  Location: [facility];  Service: General;  Laterality: N/A;   LAPAROSCOPIC NISSEN FUNDOPLICATION  06/03/2011   Procedure: LAPAROSCOPIC NISSEN FUNDOPLICATION;  Surgeon: Levert Ready, MD;  Location: [facility];  Service: General;  Laterality: N/A;   LUMBAR FUSION  12/07/2015   Right L2-3 facetectomy, posterolateral fusion L2-3 with pedicle screws, rods, local bone graft, Vivigen cancellous chips. Fusion extended to the L1 level with pedicle screws and rods   LUMBAR LAMINECTOMY/DECOMPRESSION MICRODISCECTOMY Right 06/26/2012   Procedure: LUMBAR LAMINECTOMY/DECOMPRESSION MICRODISCECTOMY;  Surgeon: Alphonso Jean, MD;  Location: [facility] OR;  Service: Orthopedics;  Laterality: Right;  RIGHT L4-5 MICRODISCECTOMY   LUMBAR LAMINECTOMY/DECOMPRESSION MICRODISCECTOMY N/A 05/29/2015   Procedure: RIGHT L2-3 MICRODISCECTOMY;  Surgeon: Alphonso Jean, MD;  Location: [facility] OR;  Service: Orthopedics;  Laterality: N/A;   TOTAL ABDOMINAL HYSTERECTOMY  1975   partial   UPPER GASTROINTESTINAL ENDOSCOPY       PAST GI PROCEDURES:  EGD 10/19/2022: - Esophagogastric landmarks identified.  - Esophageal plaques were found, consistent with candidiasis.  - Tortuous esophagus.  - Normal esophagus otherwise  - Gastric mucosal atrophy.  - History of Nissen fundoplication - intact  - Normal stomach otherwise - biopsies taken to rule out H pylori - Normal examined duodenum. Possible candidiasis could be related to the patient's nausea. - Patient has a contact number available for emergencies.  The signs and symptoms of potential delayed complications were discussed with the patient. Return to normal activities tomorrow. Written discharge instructions were provided to the patient. - Resume previous diet. - Continue present medications. - If no medication interactions, recommend fluconazole  400mg  x 1 dose then another 200mg  / day for 13 days (14 day treatment course for candidiasis)  Flexible sigmoidoscopy 10/19/2022: - Diverticulosis in the sigmoid colon.  - Internal hemorrhoids.  - The examination was otherwise normal.  - Biopsies were taken with a cold forceps from the left colon for evaluation of microscopic colitis.   1. Surgical [P], gastric antrum and gastric body REACTIVE GASTROPATHY WITH MINIMAL CHRONIC GASTRITIS NEGATIVE FOR H. PYLORI, INTESTINAL METAPLASIA, DYSPLASIA AND CARCINOMA 2. Surgical [P], colon nos, random sites BENIGN COLONIC MUCOSA WITH NO DIAGNOSTIC ABNORMALITY   EGD 07/11/19:  - The Z-line was regular. - Patchy, white plaques were found in the middle third of the esophagus and in the lower third of the esophagus consistent with esophageal candidiasis. - The lower third of the esophagus was tortuous with an angulated turn into the Nissen. No obvious stenosis noted at the GEJ (previously had this in 2019 and dilated) - The exam of the esophagus was otherwise normal. - Evidence of a Nissen fundoplication was found in the cardia. The wrap appeared intact. A retained suture was present. I attempted to remove it with forceps but it was buried into the mucosa and not removed. - The exam of the stomach was otherwise normal. - Biopsies were taken with a cold forceps in the gastric body, at the incisura and in the gastric antrum for Helicobacter pylori testing. - The duodenal bulb and second portion of the duodenum were normal.   Surgical [P], gastric antrum and gastric body - REACTIVE GASTROPATHY - NO H. PYLORI OR INTESTINAL METAPLASIA IDENTIFIED - SEE COMMENT     Colonoscopy 05/17/2017: - The perianal and digital rectal examinations were normal. - A 4 mm polyp was found in the cecum. The polyp was sessile. The polyp was removed with a cold snare. Resection and retrieval were  complete. - Two sessile polyps were found in the ascending colon. The polyps were 4 to 5 mm in size. These polyps were removed with a cold snare. Resection and retrieval were complete. - Two sessile polyps were found in the transverse colon. The polyps were 4 to 5 mm in size. These polyps were removed with a cold snare. Resection and retrieval were complete. - A 6 mm polyp was found in the descending colon. The polyp was sessile. The polyp was removed with a cold snare. Resection and retrieval were complete. - Scattered medium-mouthed diverticula were found in the entire colon. - Internal hemorrhoids were found during retroflexion. - The colon was tortous. The exam was otherwise without abnormality.  6 adenomas   EGD 02/20/2018:  - Esophagogastric landmarks were identified: the Z-line was found at 36 cm. - The lower esophagus was tortuous with an angulated turn into the fundoplication. No obvious stenosis was appreciated. - A TTS dilator was passed through the scope. Dilation with a 16-17-18 mm balloon dilator was performed to 16 mm, 17 mm and 18 mm at the gastroesophageal junction without any appreciable mucosal wrent. - Multiple diminutive white plaques were found in the lower third of the esophagus. Brushings for KOH prep were obtained. - The exam of the esophagus was otherwise normal. - Evidence of a Nissen fundoplication was found in the cardia, with a retained suture visible. The wrap was intact. - Patchy inflammation characterized by erythema was found in the gastric antrum. Biopsies were taken with a cold forceps for histology. - The exam of the stomach was otherwise normal. - The duodenal bulb and second portion of the duodenum were normal.   EGD 01/24/2018: -  Z-line, 35 cm from the incisors. - Suspect mild esophagitis dissecans - Tortous esophagus with angulated entrance to fundoplication versus mild stenosis - dilation not performed given volume of food in the stomach - A large amount of food (residue) in the stomach, the procedure was aborted.     EGD 12/25/2015  -  Normal exam, fundoplication noted - per Dr. Dominic Friendly  Esophageal manometry 08/15/2015 - intact peristalsis, normal LES relaxation, intermittent nonspecific spasm  EGD 03/15/2013 - esophageal candidiasis, recurrent 4-5 cm hernia, history of Nissen with Donelda Fujita lesion Colonoscopy 04/07/2011 - 2 small adenomatous polyps, internal hemorrhoids  IMAGE STUDIES:    CT angio abdomen / pelvis - 04/05/2017 - normal CT abdomen / pelvis 03/10/2017 - mild stable CBD dilation 10mm post cholecystectomy CT abdomen pelvis 03/27/2016 - normal abdomen,  Barium swallow 12/24/15 - stricture in distal 3rd of esophagus, nonspecific dysmotility Gastric emptying study 03/2013 - normal    DEXA scan 01/27/22 - T score (-) 2.4 - osteopenia        Current Medications, Allergies, Past Medical History, Past Surgical History, Family History and Social History were reviewed in [employer] record.   Review of Systems:   Constitutional: Negative for fever, sweats, chills or weight loss.  Respiratory: Negative for shortness of breath.   Cardiovascular: Negative for chest pain, palpitations and leg swelling.  Gastrointestinal: See HPI.  Musculoskeletal: Negative for back pain or muscle aches.  Neurological: Negative for dizziness, headaches or paresthesias.    Physical Exam: There were no vitals taken for this visit. General: in no acute distress. Head: Normocephalic and atraumatic. Eyes: No scleral icterus. Conjunctiva pink . Ears: Normal auditory acuity. Mouth: Dentition intact. No ulcers or lesions.  Lungs: Clear throughout to auscultation. Heart: Regular rate and rhythm, no  murmur. Abdomen: Soft,  nontender and nondistended. No masses or hepatomegaly. Normal bowel sounds x 4 quadrants.  Rectal: *** Musculoskeletal: Symmetrical with no gross deformities. Extremities: No edema. Neurological: Alert oriented x 4. No focal deficits.  Psychological: Alert and cooperative. Normal mood and affect  Assessment and Recommendations: ***

## 2023-10-30 ENCOUNTER — Ambulatory Visit: Admitting: Nurse Practitioner

## 2023-10-30 ENCOUNTER — Encounter: Payer: Self-pay | Admitting: Nurse Practitioner

## 2023-10-30 VITALS — BP 120/60 | HR 76 | Ht 61.5 in | Wt 146.4 lb

## 2023-10-30 DIAGNOSIS — K219 Gastro-esophageal reflux disease without esophagitis: Secondary | ICD-10-CM | POA: Diagnosis not present

## 2023-10-30 DIAGNOSIS — Z860101 Personal history of adenomatous and serrated colon polyps: Secondary | ICD-10-CM

## 2023-10-30 DIAGNOSIS — K649 Unspecified hemorrhoids: Secondary | ICD-10-CM

## 2023-10-30 DIAGNOSIS — K625 Hemorrhage of anus and rectum: Secondary | ICD-10-CM | POA: Diagnosis not present

## 2023-10-30 NOTE — Patient Instructions (Addendum)
 Apply a small amount of Desitin inside the anal opening and to the external anal area three times daily as needed for anal or hemorrhoidal irritation/bleeding.   Take Miralax  1 capful mixed in 8 ounces of water at bed time for constipation as tolerated.  If you use inhalers (even only as needed), please bring them with you on the day of your procedure.  DO NOT TAKE 7 DAYS PRIOR TO TEST- Trulicity (dulaglutide) Ozempic, Wegovy (semaglutide) Mounjaro (tirzepatide) Bydureon Bcise (exanatide extended release)  DO NOT TAKE 1 DAY PRIOR TO YOUR TEST Rybelsus (semaglutide) Adlyxin (lixisenatide) Victoza (liraglutide) Byetta (exanatide) ___________________________________________________________________________  Please follow up sooner if symptoms increase or worsen   Due to recent changes in healthcare laws, you may see the results of your imaging and laboratory studies on MyChart before your provider has had a chance to review them.  We understand that in some cases there may be results that are confusing or concerning to you. Not all laboratory results come back in the same time frame and the provider may be waiting for multiple results in order to interpret others.  Please give us  48 hours in order for your provider to thoroughly review all the results before contacting the office for clarification of your results.   Thank you for trusting me with your gastrointestinal care!   Elida Shawl, NP _______________________________________________________  If your blood pressure at your visit was 140/90 or greater, please contact your primary care physician to follow up on this.  _______________________________________________________  If you are age 54 or older, your body mass index should be between 23-30. Your Body mass index is 27.21 kg/m. If this is out of the aforementioned range listed, please consider follow up with your Primary Care Provider.  If you are age 46 or younger,  your body mass index should be between 19-25. Your Body mass index is 27.21 kg/m. If this is out of the aformentioned range listed, please consider follow up with your Primary Care Provider.   ________________________________________________________  The [facility] providers would like to encourage you to use MYCHART to communicate with providers for non-urgent requests or questions.  Due to long hold times on the telephone, sending your provider a message by [facility] may be a faster and more efficient way to get a response.  Please allow 48 business hours for a response.  Please remember that this is for non-urgent requests.  _______________________________________________________

## 2023-10-31 ENCOUNTER — Encounter: Payer: PPO | Admitting: Nurse Practitioner

## 2023-10-31 NOTE — Progress Notes (Signed)
 Agree with assessment and plan as outlined.

## 2023-11-06 ENCOUNTER — Encounter: Payer: PPO | Admitting: Internal Medicine

## 2023-11-07 DIAGNOSIS — H43813 Vitreous degeneration, bilateral: Secondary | ICD-10-CM | POA: Diagnosis not present

## 2023-11-07 DIAGNOSIS — Z961 Presence of intraocular lens: Secondary | ICD-10-CM | POA: Diagnosis not present

## 2023-11-07 DIAGNOSIS — H5203 Hypermetropia, bilateral: Secondary | ICD-10-CM | POA: Diagnosis not present

## 2023-11-07 DIAGNOSIS — H26493 Other secondary cataract, bilateral: Secondary | ICD-10-CM | POA: Diagnosis not present

## 2023-11-15 NOTE — Progress Notes (Unsigned)
 This encounter was created in error - please disregard.

## 2023-11-16 ENCOUNTER — Ambulatory Visit: Attending: Physician Assistant | Admitting: Physician Assistant

## 2023-11-22 ENCOUNTER — Encounter: Admitting: Vascular Surgery

## 2023-11-22 ENCOUNTER — Encounter (HOSPITAL_COMMUNITY)

## 2023-12-04 ENCOUNTER — Other Ambulatory Visit: Payer: Self-pay | Admitting: *Deleted

## 2023-12-04 DIAGNOSIS — M79606 Pain in leg, unspecified: Secondary | ICD-10-CM

## 2023-12-04 NOTE — Addendum Note (Signed)
 Addended by: TRUDY ELVIE SAILOR on: 12/04/2023 11:23 AM   Modules accepted: Orders

## 2023-12-09 NOTE — Progress Notes (Deleted)
 Subjective:   Patient ID: [name] [name], female    DOB: 02-Jan-1936    MRN: [id number]   Brief patient profile:  57 yowf  never smoker with large hiatal hernia & recurrent asthmatic bronchitis , about twice/yr for many yrs. Lung parenchyma looks ok on Ct scans 10/2006 & 12/10. PFTs nml in 12/09 & mild airway obstruction in 08/2009.  First presented 3/09 . CT neck for thyroid  imaging s/o mediastinal contour abnormal. Ct chest >> large hiatal hernia & BL upper lobe hazy , ground glass opacity.  Dec, 2010  [name] [name])  Fe def anemia requiring transfusions, small bowel AVMs suspected.  Mar 2011 Acute visit- c/o cough and wheezing >> augmentin  helped, enalapril  changed to diovan     09/08/2017 1st  office visit/ [name] [name]  Re AB  Chief Complaint  Patient presents with   Pulmonary Consult    Last seen here in July 2015. She states that she is here for just check up. Breathing is overall doing well.  She rarely uses her albuterol  inhaler.   uses dulera  200 when over does it  Sleeping s resp problems  No cough  Walks at [employer] at a slow pace with a cane and not really limited by doe   rec In the event of cough/ wheeze or worse short of breath > dulera   100 or 200 up to 2 pffs every 12 hours Work on inhaler technique:   Plan B = Backup Only use your albuterol  as a rescue medication Plan C = Crisis - only use your albuterol  nebulizer if you first try Plan B and it fails to help > ok to use the nebulizer up to every 4 hours but if start needing it regularly call for immediate appointment        03/22/2022  f/u ov/[name] [name] re: asthma/gerd   maint on symbicort  160   Chief Complaint  Patient presents with   Follow-up    Cough is some better but has not resolved. She has prod cough with green sputum.    Dyspnea:  baseline  Cough: was much better p augmentin  and pred then gradually worse again  Sleeping: on bed blocks s noct awakening but then cough present w/in a few min of stirring in am  SABA use:  rarely uses 02: none  Rec Plan A = Automatic = Always=    Symbicort  160  Take 2 puffs first thing in am and then another 2 puffs about 12 hours later.  Plan B = Backup (to supplement plan A, not to replace it) Only use your albuterol  inhaler as a rescue medication  Plan C = Crisis (instead of Plan B but only if Plan B stops working) - only use your albuterol  nebulizer if you first try Plan B  Prednisone  10 mg take  4 each am x 2 days,   2 each am x 2 days,  1 each am x 2 days and stop  Augmentin  875 mg take one pill twice daily  X 14 days  Please schedule a follow up office visit in 6 weeks, call sooner if needed bring inhaler/spacer /solutions with you including over the counter medications other than vitamins     05/16/2022  f/u ov/[name] [name] re: AB/gerd flare since fall 2023  maint on Symbicort  160/ ppi bid   Chief Complaint  Patient presents with   Follow-up    Pt fell 03/20/2022.  Pt feels like she bruised rib area. No fracture.  Rib area painful with deep breaths.  Dyspnea:  [employer] walking fine  Cough: no am flare/worse if overdoes it  / mucoid  Sleeping: sleeping on bed block/ SABA use: not much  02: none  Covid status:   vax max [place] cp p fell in November 2023 but gradually improving/ worse with cough  Rec Prednisone  10 mg take  4 each am x 2 days,   2 each am x 2 days,  1 each am x 2 days and stop  For cough / congestion > stop robitussin and start mucinex  dm 1200 mg every 12 hours as needed Plan A = Automatic = Always=   Symbicort  160 Take 2 puffs first thing in am and then another 2 puffs about 12 hours later.  Work on inhaler technique:  Plan B = Backup (to supplement plan A, not to replace it) Only use your albuterol  inhaler as a rescue medication Plan C = Crisis (instead of Plan B but only if Plan B stops working) - only use your albuterol  nebulizer if you first try Plan B Please schedule a follow up office visit in 6 weeks, call sooner if needed - bring your symbicort  and  spacer     07/12/2022  f/u ov/[name] [name] re: AB/gerd   maint on ppi bid / no ICS (can't afford )   Chief Complaint  Patient presents with   Follow-up    Breathing has been worse over the past wk- started after cleaning a dusty room, she was unable to get the Dulera  due to cost, using albuterol  inhaler 3-4 x per day. She has had prod cough with green/yellow, thick sputum and has noticed some wheezing.   Dyspnea:  [employer] walking still  Cough: worst 1st thing in am turing thicker and slt green  Sleeping: on bed blocks  SABA use: rarely  02: none  Rec Prednisone  10 mg take  4 each am x 2 days,   2 each am x 2 days,  1 each am x 2 days and stop  Mucinex  dm 1200 mg every 12 hours as needed Plan A = Automatic = Always=   albuterol  nebulizer with budesonide  0.25 twice daily and if getting worse increase to 4 daily  Plan B = Backup (to supplement plan A, not to replace it) Only use your albuterol  inhaler as a rescue medication   08/01/22  Icard rec breztri  > did not fill rx   09/05/2022  f/u ov/[name] [name] re: AB/gerd     Not  symbicort  160 bid but saba instead (confused with maint vs prn) Chief Complaint  Patient presents with   Follow-up    Breathing is overall doing well. She has occ wheezing occ in the am- uses her albuterol  and then just the symbicort  prn.    Dyspnea:  MMRC2 = can't walk a nl pace on a flat grade s sob but does fine slow and flat eg [employer] walking  Cough: none   Sleeping: bed blocks SABA use: none needed  02: none  Rec Plan A = Automatic = Always=    Symbicort  160 Take 2 puffs first thing in am and then another 2 puffs about 12 hours later or budesonide  with albuterol  twice daily  Work on inhaler technique: >>>  Remember how golfers warm up by taking practice swings - do this with an empty inhaler  Plan B = Backup (to supplement plan A, not to replace it) Only use your albuterol  inhaler as a rescue medication  Plan C = Crisis (instead of Plan B but  only if Plan B stops working) -  only use your albuterol  nebulizer if you first try Plan B  Plan D = Deltasone  = prednisone  Refillable:  Prednisone  10 mg take  4 each am x 2 days,   2 each am x 2 days,  1 each am x 2 days and stop   Please schedule a follow up visit in 3 months but call sooner if needed  with all respiratory medications /inhalers/ solutions in hand   12/12/2023  f/u ov/[name] [name] re: AB/gerd   maint on ***  did *** bring meds  No chief complaint on file.   Dyspnea:  *** Cough: *** Sleeping: *** resp cc  SABA use: *** 02: ***  Lung cancer screening :  ***    No obvious day to day or daytime variability or assoc excess/ purulent sputum or mucus plugs or hemoptysis or cp or chest tightness, subjective wheeze or overt sinus or hb symptoms.    Also denies any obvious fluctuation of symptoms with weather or environmental changes or other aggravating or alleviating factors except as outlined above   No unusual exposure hx or h/o childhood pna/ asthma or knowledge of premature birth.  Current Allergies, Complete Past Medical History, Past Surgical History, Family History, and Social History were reviewed in [employer] record.  ROS  The following are not active complaints unless bolded Hoarseness, sore throat, dysphagia, dental problems, itching, sneezing,  nasal congestion or discharge of excess mucus or purulent secretions, ear ache,   fever, chills, sweats, unintended wt loss or wt gain, classically pleuritic or exertional cp,  orthopnea pnd or arm/hand swelling  or leg swelling, presyncope, palpitations, abdominal pain, anorexia, nausea, vomiting, diarrhea  or change in bowel habits or change in bladder habits, change in stools or change in urine, dysuria, hematuria,  rash, arthralgias, visual complaints, headache, numbness, weakness or ataxia or problems with walking or coordination,  change in mood or  memory.        No outpatient medications have been marked as taking for the 12/12/23  encounter (Appointment) with Darlean Ozell NOVAK, MD.                   Objective:   Physical Exam  Wts    12/12/2023           ***  07/12/2022          143  05/16/2022          142  03/22/2022       144   02/25/2022      147  01/28/2022        148 12/14/2021          144   05/12/2021          143  10/14/2020          150 09/02/2020        152   09/08/2017         164   11/19/13 132 lb (59.875 kg)  11/14/13 127 lb (57.607 kg)  11/05/13 125 lb (56.7 kg)     Vital signs reviewed  12/12/2023  - Note at rest 02 sats  ***% on ***   General appearance:    ***   wheeze on FVC only - much better with PLB***      Assessment & Plan:

## 2023-12-12 ENCOUNTER — Ambulatory Visit: Admitting: Internal Medicine

## 2023-12-18 DIAGNOSIS — I959 Hypotension, unspecified: Secondary | ICD-10-CM | POA: Insufficient documentation

## 2023-12-18 DIAGNOSIS — R6889 Other general symptoms and signs: Secondary | ICD-10-CM | POA: Insufficient documentation

## 2023-12-18 NOTE — Progress Notes (Signed)
 Acute Office Visit  Subjective:     Patient ID: [name] [name], female    DOB: 11-26-1935, 88 y.o.   MRN: [id number]  No chief complaint on file.   HPI  Discussed the use of AI scribe software for clinical note transcription with the patient, who gave verbal consent to proceed.  History of Present Illness [name] [name] is an 88 year old female who presents with wheezing and shortness of breath.  Respiratory symptoms - Wheezing and shortness of breath for the past month - No relief with nebulizer inhaler (Dulera ) prior to visit - No fever or cough  Constitutional symptoms - Fatigue and lack of energy - Poor appetite observed by family member  - Has been caring for her husband who recently had neck surgery      ROS Per HPI      Objective:    BP 134/60 (BP Location: Left Arm, Patient Position: Sitting)   Pulse 80   Temp 98.5 F (36.9 C) (Temporal)   Ht 5' 1.5 (1.562 m)   Wt 147 lb 12.8 oz (67 kg)   SpO2 98%   BMI 27.47 kg/m    Physical Exam Vitals and nursing note reviewed.  Constitutional:      General: She is not in acute distress.    Comments: Appears fatigued today  HENT:     Head: Normocephalic and atraumatic.     Right Ear: External ear normal.     Left Ear: External ear normal.     Nose: Nose normal.     Mouth/Throat:     Mouth: Mucous membranes are moist.     Pharynx: Oropharynx is clear.  Eyes:     Extraocular Movements: Extraocular movements intact.     Pupils: Pupils are equal, round, and reactive to light.  Cardiovascular:     Rate and Rhythm: Normal rate and regular rhythm.     Pulses: Normal pulses.     Heart sounds: Normal heart sounds.  Pulmonary:     Effort: No respiratory distress.     Breath sounds: Wheezing and rhonchi present. No rales.     Comments: Labored breathing with exertion, resolved with rest Musculoskeletal:     Cervical back: Normal range of motion.     Right lower leg: No edema.     Left lower leg: No edema.      Comments: Using single point cane  Lymphadenopathy:     Cervical: No cervical adenopathy.  Neurological:     General: No focal deficit present.     Mental Status: She is alert and oriented to person, place, and time.  Psychiatric:        Mood and Affect: Mood normal.        Thought Content: Thought content normal.     No results found for any visits on 12/19/23.      Assessment & Plan:   Assessment and Plan Assessment & Plan Wheezing and shortness of breath in the setting of chronic bronchitis Wheezing and shortness of breath persisted for a month. Concern for possible pneumonia due to additional sounds on the left side. COVID test negative. Differential includes pneumonia. Steroid shot offered to improve breathing despite dislike for steroids. - Order chest X-ray to evaluate for pneumonia. - Prescribe antibiotic for potential pneumonia. - Administer steroid shot to improve breathing. - Check labs to assess iron levels.  Vit D Deficiency - vit D today  Iron deficiency/Cold intolerance Iron levels need assessment to rule out  deficiency as a factor in tiredness and lack of appetite. - Check labs to assess iron levels.     Orders Placed This Encounter  Procedures   DG Chest 2 View    Standing Status:   Future    Number of Occurrences:   1    Expiration Date:   06/20/2024    Reason for Exam (SYMPTOM  OR DIAGNOSIS REQUIRED):   cough, SOB    Preferred imaging location?:   [facility]   CBC with Differential/Platelet    Release to patient:   Immediate [1]   Comprehensive metabolic panel with GFR    Release to patient:   Immediate [1]     Meds ordered this encounter  Medications   methylPREDNISolone  acetate (DEPO-MEDROL ) injection 40 mg    Return if symptoms worsen or fail to improve.  Corean LITTIE Ku, FNP

## 2023-12-19 ENCOUNTER — Encounter: Payer: Self-pay | Admitting: Family Medicine

## 2023-12-19 ENCOUNTER — Ambulatory Visit (INDEPENDENT_AMBULATORY_CARE_PROVIDER_SITE_OTHER): Admitting: Family Medicine

## 2023-12-19 ENCOUNTER — Ambulatory Visit: Payer: Self-pay | Admitting: Family Medicine

## 2023-12-19 ENCOUNTER — Ambulatory Visit

## 2023-12-19 VITALS — BP 134/60 | HR 80 | Temp 98.5°F | Ht 61.5 in | Wt 147.8 lb

## 2023-12-19 DIAGNOSIS — R6889 Other general symptoms and signs: Secondary | ICD-10-CM

## 2023-12-19 DIAGNOSIS — R0602 Shortness of breath: Secondary | ICD-10-CM

## 2023-12-19 DIAGNOSIS — J42 Unspecified chronic bronchitis: Secondary | ICD-10-CM

## 2023-12-19 DIAGNOSIS — R059 Cough, unspecified: Secondary | ICD-10-CM | POA: Diagnosis not present

## 2023-12-19 DIAGNOSIS — I2510 Atherosclerotic heart disease of native coronary artery without angina pectoris: Secondary | ICD-10-CM | POA: Diagnosis not present

## 2023-12-19 DIAGNOSIS — E611 Iron deficiency: Secondary | ICD-10-CM | POA: Diagnosis not present

## 2023-12-19 DIAGNOSIS — E559 Vitamin D deficiency, unspecified: Secondary | ICD-10-CM | POA: Diagnosis not present

## 2023-12-19 DIAGNOSIS — B9689 Other specified bacterial agents as the cause of diseases classified elsewhere: Secondary | ICD-10-CM

## 2023-12-19 DIAGNOSIS — I959 Hypotension, unspecified: Secondary | ICD-10-CM

## 2023-12-19 DIAGNOSIS — R918 Other nonspecific abnormal finding of lung field: Secondary | ICD-10-CM | POA: Diagnosis not present

## 2023-12-19 LAB — CBC WITH DIFFERENTIAL/PLATELET
Basophils Absolute: 0.1 K/uL (ref 0.0–0.1)
Basophils Relative: 0.8 % (ref 0.0–3.0)
Eosinophils Absolute: 0.2 K/uL (ref 0.0–0.7)
Eosinophils Relative: 2.3 % (ref 0.0–5.0)
HCT: 43.8 % (ref 36.0–46.0)
Hemoglobin: 14.1 g/dL (ref 12.0–15.0)
Lymphocytes Relative: 21.1 % (ref 12.0–46.0)
Lymphs Abs: 1.9 K/uL (ref 0.7–4.0)
MCHC: 32.3 g/dL (ref 30.0–36.0)
MCV: 95.5 fl (ref 78.0–100.0)
Monocytes Absolute: 0.7 K/uL (ref 0.1–1.0)
Monocytes Relative: 8.1 % (ref 3.0–12.0)
Neutro Abs: 5.9 K/uL (ref 1.4–7.7)
Neutrophils Relative %: 67.7 % (ref 43.0–77.0)
Platelets: 349 K/uL (ref 150.0–400.0)
RBC: 4.59 Mil/uL (ref 3.87–5.11)
RDW: 14.9 % (ref 11.5–15.5)
WBC: 8.8 K/uL (ref 4.0–10.5)

## 2023-12-19 LAB — COMPREHENSIVE METABOLIC PANEL WITH GFR
ALT: 11 U/L (ref 0–35)
AST: 14 U/L (ref 0–37)
Albumin: 4.3 g/dL (ref 3.5–5.2)
Alkaline Phosphatase: 82 U/L (ref 39–117)
BUN: 19 mg/dL (ref 6–23)
CO2: 25 meq/L (ref 19–32)
Calcium: 9.4 mg/dL (ref 8.4–10.5)
Chloride: 106 meq/L (ref 96–112)
Creatinine, Ser: 0.78 mg/dL (ref 0.40–1.20)
GFR: 68.92 mL/min (ref >60.00)
Glucose, Bld: 84 mg/dL (ref 70–99)
Potassium: 4.2 meq/L (ref 3.5–5.1)
Sodium: 140 meq/L (ref 135–145)
Total Bilirubin: 0.6 mg/dL (ref 0.2–1.2)
Total Protein: 7.4 g/dL (ref 6.0–8.3)

## 2023-12-19 MED ORDER — PREDNISONE 20 MG PO TABS: 20.0000 mg | ORAL_TABLET | Freq: Every day | ORAL | 0 refills | Status: AC

## 2023-12-19 MED ORDER — METHYLPREDNISOLONE ACETATE 40 MG/ML IJ SUSP
40.0000 mg | Freq: Once | INTRAMUSCULAR | Status: AC
  Administered 2023-12-19 (×2): 40 mg via INTRAMUSCULAR

## 2023-12-19 MED ORDER — CEFDINIR 300 MG PO CAPS
300.0000 mg | ORAL_CAPSULE | Freq: Two times a day (BID) | ORAL | 0 refills | Status: AC
Start: 2023-12-19 — End: 2023-12-26

## 2023-12-19 NOTE — Patient Instructions (Addendum)
 We are checking labs today, will be in contact with any results that require further attention  You have received a steroid injection in the office today.  We are getting an xray today. We will be in contact with any abnormal results that require further attention.  Follow-up with me for new or worsening symptoms.

## 2023-12-20 ENCOUNTER — Encounter: Payer: Self-pay | Admitting: Vascular Surgery

## 2023-12-20 ENCOUNTER — Ambulatory Visit: Admitting: Vascular Surgery

## 2023-12-20 ENCOUNTER — Ambulatory Visit: Admitting: Gastroenterology

## 2023-12-20 ENCOUNTER — Ambulatory Visit (HOSPITAL_COMMUNITY)
Admission: RE | Admit: 2023-12-20 | Discharge: 2023-12-20 | Disposition: A | Discharge status: Home / Self Care | Source: Ambulatory Visit | Attending: Vascular Surgery | Admitting: Vascular Surgery

## 2023-12-20 VITALS — BP 154/82 | HR 66 | Temp 98.2°F | Resp 18 | Ht 61.5 in | Wt 147.7 lb

## 2023-12-20 DIAGNOSIS — M25561 Pain in right knee: Secondary | ICD-10-CM

## 2023-12-20 DIAGNOSIS — M25562 Pain in left knee: Secondary | ICD-10-CM

## 2023-12-20 DIAGNOSIS — M79606 Pain in leg, unspecified: Secondary | ICD-10-CM | POA: Insufficient documentation

## 2023-12-20 LAB — VAS US ABI WITH/WO TBI: Right ABI: 1.41

## 2023-12-20 NOTE — Progress Notes (Signed)
 Patient ID: [name] [name], female   DOB: 12-Aug-1935, 88 y.o.   MRN: [id number]  Reason for Consult: New Patient (Initial Visit)   Referred by Celena Sharper, MD  Subjective:     HPI:  [name] [name] is a 88 y.o. female without significant vascular history does have a history of coronary artery disease.  Most recently she complains of pain particularly at night in her bilateral lower extremities the right is worse than left.  She does not have pain with walking.  She continues to walk with the help of a rollator.  She denies tissue loss or ulceration and claudication.  No history of stroke, TIA or amaurosis.  She does have associated restless leg syndrome.  Risk factors for vascular disease include chronic kidney disease, hyperlipidemia and hypertension.  Past Medical History:  Diagnosis Date   Anxiety    takes Xanax  daily as needed   Asthma    Albuterol  daily as needed   Blood transfusion without reported diagnosis    CAD (coronary artery disease)    LHC 2003 with 40% pLAD, 30% mLAD, 100% D1 (moderate vessel), 100%  D2 (small vessel), 95% mid small OM2.  Pt had PTCA to D1;  D2 and OM2 small vessels and tx medically;  Last Myoview  (2012): anterior infarct seen with scar, no ischemia. EF normal. Med Rx // Myoview  (12/14):  Low risk; ant scar w/ peri-infarct ischemia; EF 55% with ant AK // Myoview  5/16: EF 55-65, ant, apical scar, no ischemia    Cataract    Chronic back pain    HNP, DDD, spinal stenosis, vertebral compression fractures.    Chronic nausea    takes Zofran  daily as needed.  normal gastric emptying study 03/2013   CKD (chronic kidney disease), stage III (HCC) 09/27/2017   Constipation    felt to be functional. takes Senokot and Miralax  daily.  treated with Linzess  in past.    DIVERTICULOSIS, COLON 08/28/2008   Qualifier: Diagnosis of  By: Vivian, [name], [name]     Elevated LFTs 2015   probably med induced DILI, from Augmentin  vs Pravastatin    GERD (gastroesophageal reflux  disease)    hx of esophageal stricture    Hiatal hernia    Paraesophageal hernias. s/p fundoplications in 2013 and 04/2013   History of bronchitis several of yrs ago   History of colon polyps 03/2009, 03/2011   adenomatous colon polyps, no high grade dysplasia.    History of vertigo    no meds   Hyperlipidemia    takes Pravastatin  daily   Hypertension    takes Amlodipine ,Metoprolol ,and Losartan  daily   Multiple closed pelvic fractures without disruption of pelvic circle (HCC)    Nausea 03/2016   Osteoporosis    PONV (postoperative nausea and vomiting)    yrs ago   Pubic ramus fracture (HCC) 12/20/2019   Slow urinary stream    takes Rapaflo daily   TIA (transient ischemic attack) 1995   no residual effects noted   Vertebral compression fracture (HCC) 06/23/2012   Family History  Problem Relation Age of Onset   Colon cancer Mother 35   Heart attack Father 39       heart attack   Brain cancer Brother        tumor    Heart disease Brother    Ulcers Brother    Arthritis Brother    Kidney disease Daughter    Esophageal cancer Neg Hx    Stomach cancer Neg Hx  Past Surgical History:  Procedure Laterality Date   APPENDECTOMY     patient unsure of date   BACK SURGERY     BLADDER REPAIR     CHOLECYSTECTOMY     Patient unsure of date.   CORONARY ANGIOPLASTY  11/16/2001   1 stent   ESOPHAGEAL MANOMETRY N/A 03/25/2013   Procedure: ESOPHAGEAL MANOMETRY (EM);  Surgeon: [name], MD;  Location: [facility];  Service: Endoscopy;  Laterality: N/A;   ESOPHAGOGASTRODUODENOSCOPY N/A 03/15/2013   Procedure: ESOPHAGOGASTRODUODENOSCOPY (EGD);  Surgeon: [name], MD;  Location: [facility] ENDOSCOPY;  Service: Endoscopy;  Laterality: N/A;   ESOPHAGOGASTRODUODENOSCOPY N/A 12/25/2015   Procedure: ESOPHAGOGASTRODUODENOSCOPY (EGD);  Surgeon: [name], MD;  Location: [facility] ENDOSCOPY;  Service: Endoscopy;  Laterality: N/A;   EYE SURGERY  11/2000   bilateral cataracts with lens implant    FIXATION KYPHOPLASTY LUMBAR SPINE  X 2   L3-4   HEMORRHOID SURGERY     HIATAL HERNIA REPAIR  06/03/2011   Procedure: LAPAROSCOPIC REPAIR OF HIATAL HERNIA;  Surgeon: [name], MD;  Location: [facility] ORS;  Service: General;  Laterality: N/A;   HIATAL HERNIA REPAIR N/A 04/24/2013   Procedure: LAPAROSCOPIC  REPAIR RECURRENT PARASOPHAGEAL HIATAL HERNIA WITH FUNDOPLICATION;  Surgeon: Elspeth KYM Schultze, MD;  Location: [facility];  Service: General;  Laterality: N/A;   INSERTION OF MESH N/A 04/24/2013   Procedure: INSERTION OF MESH;  Surgeon: Elspeth KYM Schultze, MD;  Location: [facility];  Service: General;  Laterality: N/A;   INTRAMEDULLARY (IM) NAIL INTERTROCHANTERIC Right 05/30/2023   Procedure: INTRAMEDULLARY (IM) NAIL INTERTROCHANTERIC;  Surgeon: Celena Sharper, MD;  Location: [facility] OR;  Service: Orthopedics;  Laterality: Right;   IR KYPHO THORACIC WITH BONE BIOPSY  09/29/2017   IR KYPHO THORACIC WITH BONE BIOPSY  11/03/2017   IR KYPHO THORACIC WITH BONE BIOPSY  02/14/2019   IR KYPHO THORACIC WITH BONE BIOPSY  04/02/2019   LAPAROSCOPIC LYSIS OF ADHESIONS N/A 04/24/2013   Procedure: LAPAROSCOPIC LYSIS OF ADHESIONS;  Surgeon: Elspeth KYM Schultze, MD;  Location: [facility];  Service: General;  Laterality: N/A;   LAPAROSCOPIC NISSEN FUNDOPLICATION  06/03/2011   Procedure: LAPAROSCOPIC NISSEN FUNDOPLICATION;  Surgeon: [name], MD;  Location: [facility];  Service: General;  Laterality: N/A;   LUMBAR FUSION  12/07/2015   Right L2-3 facetectomy, posterolateral fusion L2-3 with pedicle screws, rods, local bone graft, Vivigen cancellous chips. Fusion extended to the L1 level with pedicle screws and rods   LUMBAR LAMINECTOMY/DECOMPRESSION MICRODISCECTOMY Right 06/26/2012   Procedure: LUMBAR LAMINECTOMY/DECOMPRESSION MICRODISCECTOMY;  Surgeon: Lynwood FORBES Better, MD;  Location: [facility] OR;  Service: Orthopedics;  Laterality: Right;  RIGHT L4-5 MICRODISCECTOMY   LUMBAR LAMINECTOMY/DECOMPRESSION MICRODISCECTOMY N/A 05/29/2015    Procedure: RIGHT L2-3 MICRODISCECTOMY;  Surgeon: Lynwood FORBES Better, MD;  Location: [facility] OR;  Service: Orthopedics;  Laterality: N/A;   TOTAL ABDOMINAL HYSTERECTOMY  1975   partial   UPPER GASTROINTESTINAL ENDOSCOPY      Short Social History:  Social History   Tobacco Use   Smoking status: Never    Passive exposure: Never   Smokeless tobacco: Never  Substance Use Topics   Alcohol use: No    Allergies  Allergen Reactions   Ace Inhibitors Cough    Per Dr Darlean pulmonology 2013   Aspartame Diarrhea and Nausea And Vomiting   Atorvastatin  Other (See Comments)    Muscle aches   Biaxin [Clarithromycin] Other (See Comments)    Unknown reaction : PATIENT CAN TOLERATE Z-PAK.  Is  written on patient's paper chart.   Daliresp [Roflumilast] Nausea And Vomiting   Erythromycin  Other (See Comments)    Unknown reaction: PATIENT CAN TOLERATE Z-PAK.  It is written on patient's paper chart.   Levofloxacin  Nausea And Vomiting    Current Outpatient Medications  Medication Sig Dispense Refill   acetaminophen  (TYLENOL ) 325 MG tablet Take 2 tablets (650 mg total) by mouth every 6 (six) hours as needed for mild pain (pain score 1-3) or moderate pain (pain score 4-6) (or Fever >/= 101). 60 tablet 1   albuterol  (PROVENTIL ) (2.5 MG/3ML) 0.083% nebulizer solution Take 3 mLs (2.5 mg total) by nebulization every 4 (four) hours as needed for wheezing or shortness of breath. 75 mL 2   albuterol  (VENTOLIN  HFA) 108 (90 Base) MCG/ACT inhaler Inhale 2 puffs into the lungs as needed for wheezing or shortness of breath. 1 each 0   amLODipine  (NORVASC ) 10 MG tablet Take  1 tablet Daikly for BP & Heart                                                                        /                                                                   TAKE                                         BY                                                 MOUTH 90 tablet 3   aspirin  EC 81 MG tablet Take 81 mg by mouth at bedtime. Every other night      benzonatate  (TESSALON  PERLES) 100 MG capsule Take 1 capsule (100 mg total) by mouth every 6 (six) hours as needed for cough. 30 capsule 1   budesonide  (PULMICORT ) 0.25 MG/2ML nebulizer solution USE 1 VIAL IN NEBULIZER 4 TIMES DAILY 60 mL 0   busPIRone  (BUSPAR ) 10 MG tablet Take 1/2 to 1 tablet 3 x /day if Needed for Anxiety or Panic Attack  (( Replaces Xanax  )) 90 tablet 0   cefdinir  (OMNICEF ) 300 MG capsule Take 1 capsule (300 mg total) by mouth 2 (two) times daily for 7 days. 14 capsule 0   Cholecalciferol  (VITAMIN D3) 5000 units CAPS Take 5,000 Units by mouth 2 (two) times daily.      gabapentin  (NEURONTIN ) 300 MG capsule Take 1 capsule (300 mg total) by mouth 4 (four) times daily as needed. 120 capsule 3   hydrALAZINE  (APRESOLINE ) 25 MG tablet Take 1 tablet (25 mg total) by mouth 3 (three) times daily. 90 tablet 0   ketoconazole  (NIZORAL ) 2 % cream APPLY TO RINGWORM  RASH TWICE A  DAY 60 g 0   Magnesium  400 MG TABS Take 400 mg by mouth daily at 2 PM.     melatonin 5 MG TABS Take 5 mg by mouth at bedtime as needed.     mometasone -formoterol  (DULERA ) 200-5 MCG/ACT AERO Take 2 puffs first thing in am and then another 2 puffs about 12 hours later. 13 g 5   nitroGLYCERIN  (NITROSTAT ) 0.4 MG SL tablet Place 1 tablet (0.4 mg total) under the tongue every 5 (five) minutes as needed for chest pain. 90 tablet 3   pantoprazole  (PROTONIX ) 40 MG tablet Take  1 tablet  Daily  to Prevent Heartburn &  Indigestion 90 tablet 3   polyethylene glycol (MIRALAX  / GLYCOLAX ) 17 g packet Take 17 g by mouth daily. 14 each 0   predniSONE  (DELTASONE ) 20 MG tablet Take 1 tablet (20 mg total) by mouth daily for 5 days. 5 tablet 0   promethazine  (PHENERGAN ) 25 MG tablet Take  1 tablet   4 x / day  before Meals & Bedtime  as needed for Nausea 360 tablet 1   propranolol  (INDERAL ) 20 MG tablet Take 1 tablet (20 mg total) by mouth 2 (two) times daily. 180 tablet 3   rOPINIRole  (REQUIP ) 2 MG tablet Take 0.5 tablets (1 mg total)  by mouth at bedtime. 30 tablet 0   senna-docusate (SENOKOT-S) 8.6-50 MG tablet Take 2 tablets by mouth 2 (two) times daily. 60 tablet 1   solifenacin (VESICARE) 5 MG tablet Take 5 mg by mouth daily.     sucralfate  (CARAFATE ) 1 g tablet Take 1 g by mouth 4 (four) times daily -  with meals and at bedtime.     traMADol  (ULTRAM ) 50 MG tablet Take 1 tablet (50 mg total) by mouth 2 (two) times daily as needed. 60 tablet 2   vitamin B-12 (CYANOCOBALAMIN ) 1000 MCG tablet Take 5,000 mcg by mouth daily.     No current facility-administered medications for this visit.    Review of Systems  Constitutional:  Constitutional negative. HENT: HENT negative.  Eyes: Eyes negative.  Respiratory: Respiratory negative.  GI: Gastrointestinal negative.  Musculoskeletal: Positive for gait problem and leg pain.  Hematologic: Hematologic/lymphatic negative.  Psychiatric: Psychiatric negative.        Objective:  Objective   Vitals:   12/20/23 1142  BP: (!) 154/82  Pulse: 66  Resp: 18  Temp: 98.2 F (36.8 C)  TempSrc: Temporal  SpO2: 99%  Weight: 147 lb 11.2 oz (67 kg)  Height: 5' 1.5 (1.562 m)   Body mass index is 27.46 kg/m.  Physical Exam HENT:     Head: Normocephalic.     Mouth/Throat:     Mouth: Mucous membranes are moist.  Eyes:     Pupils: Pupils are equal, round, and reactive to light.  Cardiovascular:     Pulses:          Popliteal pulses are 2+ on the right side and 2+ on the left side.       Dorsalis pedis pulses are 2+ on the left side.       Posterior tibial pulses are 1+ on the right side and 2+ on the left side.  Pulmonary:     Effort: Pulmonary effort is normal.  Abdominal:     General: Abdomen is flat.  Musculoskeletal:     Right lower leg: No edema.     Left lower leg: No edema.  Skin:    General: Skin is warm.  Capillary Refill: Capillary refill takes less than 2 seconds.  Neurological:     General: No focal deficit present.     Mental Status: She is alert.   Psychiatric:        Mood and Affect: Mood normal.        Thought Content: Thought content normal.        Judgment: Judgment normal.     Data: ABI Findings:  +---------+------------------+-----+-----------+--------+  Right   Rt Pressure (mmHg)IndexWaveform   Comment   +---------+------------------+-----+-----------+--------+  Brachial 152                                         +---------+------------------+-----+-----------+--------+  PTA     215               1.41 multiphasic          +---------+------------------+-----+-----------+--------+  DP      196               1.29 multiphasic          +---------+------------------+-----+-----------+--------+  Great Toe137               0.90                      +---------+------------------+-----+-----------+--------+   +---------+------------------+-----+-----------+-------+  Left    Lt Pressure (mmHg)IndexWaveform   Comment  +---------+------------------+-----+-----------+-------+  Brachial 150                                        +---------+------------------+-----+-----------+-------+  PTA     255               1.68 multiphasic         +---------+------------------+-----+-----------+-------+  DP      255               1.68 multiphasic         +---------+------------------+-----+-----------+-------+  Great Toe140               0.92                     +---------+------------------+-----+-----------+-------+   +-------+-----------+-----------+------------+------------+  ABI/TBIToday's ABIToday's TBIPrevious ABIPrevious TBI  +-------+-----------+-----------+------------+------------+  Right 1.41       0.90                                 +-------+-----------+-----------+------------+------------+  Left           0.92                                 +-------+-----------+-----------+------------+------------+         Summary:  Right: Resting  right ankle-brachial index indicates noncompressible right  lower extremity arteries. The right toe-brachial index is normal.   Left: Resting left ankle-brachial index indicates noncompressible left  lower extremity arteries. The left toe-brachial index is normal.       Assessment/Plan:     88 year old female with history of bilateral lower extremity leg pain with preserved ABIs and palpable pulses.  As such this does not appear to be involved with vascular insufficiency and she can see me on an as-needed basis.  Penne Lonni Colorado MD [facility]

## 2023-12-21 NOTE — Telephone Encounter (Signed)
 Copied from CRM [id number]. Topic: General - Other >> Dec 21, 2023  1:55 PM Rosina BIRCH wrote: Reason for CRM: patient called stating her skin is so dry and flaky and she want to know if the doctor has any recommendations [phone]

## 2024-01-15 ENCOUNTER — Other Ambulatory Visit: Payer: Self-pay | Admitting: Family Medicine

## 2024-01-15 ENCOUNTER — Ambulatory Visit: Admitting: Orthopedic Surgery

## 2024-01-15 DIAGNOSIS — J42 Unspecified chronic bronchitis: Secondary | ICD-10-CM

## 2024-01-15 DIAGNOSIS — K219 Gastro-esophageal reflux disease without esophagitis: Secondary | ICD-10-CM

## 2024-01-15 NOTE — Telephone Encounter (Unsigned)
 Copied from CRM [id number]. Topic: Clinical - Medication Refill >> Jan 15, 2024 10:07 AM Corin V wrote: Medication: promethazine  (PHENERGAN ) 25 MG tablet Was prescribed by Dr. Tonita who has since passed- he would always have script on hand at the pharmacy for nausea caused by her hernia hx   Has the patient contacted their pharmacy? Yes (Agent: If no, request that the patient contact the pharmacy for the refill. If patient does not wish to contact the pharmacy document the reason why and proceed with request.) (Agent: If yes, when and what did the pharmacy advise?)  This is the patient's preferred pharmacy:  [id number] - [place] ([place]), [place] - [address]  ([place]) [place] [address] Phone: [phone] Fax: [phone]  Is this the correct pharmacy for this prescription? Yes If no, delete pharmacy and type the correct one.   Has the prescription been filled recently? No  Is the patient out of the medication? No  Has the patient been seen for an appointment in the last year OR does the patient have an upcoming appointment? Yes  Can we respond through MyChart? No  Agent: Please be advised that Rx refills may take up to 3 business days. We ask that you follow-up with your pharmacy.

## 2024-01-16 MED ORDER — PROMETHAZINE HCL 25 MG PO TABS: ORAL_TABLET | ORAL | 1 refills | Status: AC

## 2024-01-18 NOTE — Progress Notes (Deleted)
 Cardiology Office Note:    Date:  01/18/2024   ID:  [name] [name] Gull, DOB 09/03/1935, MRN [id number]  PCP:  [name] Corean LITTIE, FNP   [facility] Providers Cardiologist:  Oneil Parchment, MD { Click to update primary MD,subspecialty MD or APP then REFRESH:1}    Referring MD: [name] [name], *   No chief complaint on file. ***  History of Present Illness:    [name] [name] is a 88 y.o. female with a hx of CAD, HTN, HLD, GERD, asthma, IDA, MGUS, paraesophageal hernias with fundoplication chronic reflux dysphagia, presenting to the office today today for 1 year follow-up.  LHC 2003 with 40% pLAD, 30% mLAD, 100% D1 (moderate vessel), 100% D2 (small vessel), 95% mid small OM2. Pt had PTCA to D1.    Last seen in our office by Dr. Parchment on 09/12/2022 for hurting in the left arm and shoulder. NM SPECT stress test on 09/16/2022 showed no EKG changes from baseline, moderate size defect in the anteroseptal region, anterior and apex consistent with scar and possible soft tissue, no evidence of ischemia, LVEF 63% no RWMA, no significant change compared to previous scan from 09/20/2014.  Most recent echo on 10/07/2022 showed LVEF 55-60%, normal LV function, no RWMA, mild concentric LVH, G1 DD, RV SF normal, normal RV size, normal PASP, RVSP 27 mmHg, LA mild-moderately dilated, trivial MV regurg, tricuspid AV, mild calcification of AV, no AV stenosis present.    Past Medical History:  Diagnosis Date   Anxiety    takes Xanax  daily as needed   Asthma    Albuterol  daily as needed   Blood transfusion without reported diagnosis    CAD (coronary artery disease)    LHC 2003 with 40% pLAD, 30% mLAD, 100% D1 (moderate vessel), 100%  D2 (small vessel), 95% mid small OM2.  Pt had PTCA to D1;  D2 and OM2 small vessels and tx medically;  Last Myoview  (2012): anterior infarct seen with scar, no ischemia. EF normal. Med Rx // Myoview  (12/14):  Low risk; ant scar w/ peri-infarct ischemia; EF 55% with  ant AK // Myoview  5/16: EF 55-65, ant, apical scar, no ischemia    Cataract    Chronic back pain    HNP, DDD, spinal stenosis, vertebral compression fractures.    Chronic nausea    takes Zofran  daily as needed.  normal gastric emptying study 03/2013   CKD (chronic kidney disease), stage III (HCC) 09/27/2017   Constipation    felt to be functional. takes Senokot and Miralax  daily.  treated with Linzess  in past.    DIVERTICULOSIS, COLON 08/28/2008   Qualifier: Diagnosis of  By: Vivian, [name], [name]     Elevated LFTs 2015   probably med induced DILI, from Augmentin  vs Pravastatin    GERD (gastroesophageal reflux disease)    hx of esophageal stricture    Hiatal hernia    Paraesophageal hernias. s/p fundoplications in 2013 and 04/2013   History of bronchitis several of yrs ago   History of colon polyps 03/2009, 03/2011   adenomatous colon polyps, no high grade dysplasia.    History of vertigo    no meds   Hyperlipidemia    takes Pravastatin  daily   Hypertension    takes Amlodipine ,Metoprolol ,and Losartan  daily   Multiple closed pelvic fractures without disruption of pelvic circle (HCC)    Nausea 03/2016   Osteoporosis    PONV (postoperative nausea and vomiting)    yrs ago   Pubic ramus fracture (HCC) 12/20/2019  Slow urinary stream    takes Rapaflo daily   TIA (transient ischemic attack) 1995   no residual effects noted   Vertebral compression fracture (HCC) 06/23/2012    Past Surgical History:  Procedure Laterality Date   APPENDECTOMY     patient unsure of date   BACK SURGERY     BLADDER REPAIR     CHOLECYSTECTOMY     Patient unsure of date.   CORONARY ANGIOPLASTY  11/16/2001   1 stent   ESOPHAGEAL MANOMETRY N/A 03/25/2013   Procedure: ESOPHAGEAL MANOMETRY (EM);  Surgeon: [name] JONETTA Aho, MD;  Location: [facility] ENDOSCOPY;  Service: Endoscopy;  Laterality: N/A;   ESOPHAGOGASTRODUODENOSCOPY N/A 03/15/2013   Procedure: ESOPHAGOGASTRODUODENOSCOPY (EGD);  Surgeon: [name] CHRISTELLA Starch, MD;   Location: [facility] ENDOSCOPY;  Service: Endoscopy;  Laterality: N/A;   ESOPHAGOGASTRODUODENOSCOPY N/A 12/25/2015   Procedure: ESOPHAGOGASTRODUODENOSCOPY (EGD);  Surgeon: [name] [name] Legrand DOUGLAS, MD;  Location: [facility] ENDOSCOPY;  Service: Endoscopy;  Laterality: N/A;   EYE SURGERY  11/2000   bilateral cataracts with lens implant   FIXATION KYPHOPLASTY LUMBAR SPINE  X 2   L3-4   HEMORRHOID SURGERY     HIATAL HERNIA REPAIR  06/03/2011   Procedure: LAPAROSCOPIC REPAIR OF HIATAL HERNIA;  Surgeon: [name] CHRISTELLA Pacini, MD;  Location: [facility] ORS;  Service: General;  Laterality: N/A;   HIATAL HERNIA REPAIR N/A 04/24/2013   Procedure: LAPAROSCOPIC  REPAIR RECURRENT PARASOPHAGEAL HIATAL HERNIA WITH FUNDOPLICATION;  Surgeon: [name] KYM Schultze, MD;  Location: [facility] ORS;  Service: General;  Laterality: N/A;   INSERTION OF MESH N/A 04/24/2013   Procedure: INSERTION OF MESH;  Surgeon: [name] KYM Schultze, MD;  Location: [facility] ORS;  Service: General;  Laterality: N/A;   INTRAMEDULLARY (IM) NAIL INTERTROCHANTERIC Right 05/30/2023   Procedure: INTRAMEDULLARY (IM) NAIL INTERTROCHANTERIC;  Surgeon: [name] Sharper, MD;  Location: [facility] OR;  Service: Orthopedics;  Laterality: Right;   IR KYPHO THORACIC WITH BONE BIOPSY  09/29/2017   IR KYPHO THORACIC WITH BONE BIOPSY  11/03/2017   IR KYPHO THORACIC WITH BONE BIOPSY  02/14/2019   IR KYPHO THORACIC WITH BONE BIOPSY  04/02/2019   LAPAROSCOPIC LYSIS OF ADHESIONS N/A 04/24/2013   Procedure: LAPAROSCOPIC LYSIS OF ADHESIONS;  Surgeon: [name] KYM Schultze, MD;  Location: [facility] ORS;  Service: General;  Laterality: N/A;   LAPAROSCOPIC NISSEN FUNDOPLICATION  06/03/2011   Procedure: LAPAROSCOPIC NISSEN FUNDOPLICATION;  Surgeon: [name] CHRISTELLA Pacini, MD;  Location: [facility] ORS;  Service: General;  Laterality: N/A;   LUMBAR FUSION  12/07/2015   Right L2-3 facetectomy, posterolateral fusion L2-3 with pedicle screws, rods, local bone graft, Vivigen cancellous chips. Fusion extended to the L1 level with pedicle screws and rods   LUMBAR  LAMINECTOMY/DECOMPRESSION MICRODISCECTOMY Right 06/26/2012   Procedure: LUMBAR LAMINECTOMY/DECOMPRESSION MICRODISCECTOMY;  Surgeon: Lynwood FORBES Better, MD;  Location: [facility] OR;  Service: Orthopedics;  Laterality: Right;  RIGHT L4-5 MICRODISCECTOMY   LUMBAR LAMINECTOMY/DECOMPRESSION MICRODISCECTOMY N/A 05/29/2015   Procedure: RIGHT L2-3 MICRODISCECTOMY;  Surgeon: Lynwood FORBES Better, MD;  Location: [facility] OR;  Service: Orthopedics;  Laterality: N/A;   TOTAL ABDOMINAL HYSTERECTOMY  1975   partial   UPPER GASTROINTESTINAL ENDOSCOPY      Current Medications: No outpatient medications have been marked as taking for the 01/19/24 encounter (Appointment) with Janene Boer, PA.     Allergies:   Ace inhibitors, Aspartame, Atorvastatin , Biaxin [clarithromycin], Daliresp [roflumilast], Erythromycin , and Levofloxacin    Social History   Socioeconomic History   Marital status: Married    Spouse name: [name]   Number of children:  2   Years of education: 88   Highest education level: Not on file  Occupational History   Occupation: Retired    Associate Professor: RETIRED    Comment: worked at [employer]  Tobacco Use   Smoking status: Never    Passive exposure: Never   Smokeless tobacco: Never  Vaping Use   Vaping status: Never Used  Substance and Sexual Activity   Alcohol use: No   Drug use: Never   Sexual activity: Not Currently    Partners: Male    Birth control/protection: Post-menopausal, Surgical  Other Topics Concern   Not on file  Social History Narrative   Lives with husband.   Right-handed.   3 cups caffeine per day.   Social Drivers of Corporate investment banker Strain: Low Risk  (07/27/2023)   Overall Financial Resource Strain (CARDIA)    Difficulty of Paying Living Expenses: Not hard at all  Food Insecurity: No Food Insecurity (07/27/2023)   Hunger Vital Sign    Worried About Running Out of Food in the Last Year: Never true    Ran Out of Food in the Last Year: Never true  Transportation Needs: No  Transportation Needs (07/27/2023)   PRAPARE - Administrator, Civil Service (Medical): No    Lack of Transportation (Non-Medical): No  Physical Activity: Insufficiently Active (07/27/2023)   Exercise Vital Sign    Days of Exercise per Week: 2 days    Minutes of Exercise per Session: 30 min  Stress: Stress Concern Present (07/27/2023)   [employer] of Occupational Health - Occupational Stress Questionnaire    Feeling of Stress : To some extent  Social Connections: Socially Integrated (07/27/2023)   Social Connection and Isolation Panel    Frequency of Communication with Friends and Family: More than three times a week    Frequency of Social Gatherings with Friends and Family: Once a week    Attends Religious Services: More than 4 times per year    Active Member of [employer] or Organizations: Yes    Attends Banker Meetings: Never    Marital Status: Married     Family History: The patient's ***family history includes Arthritis in her brother; Brain cancer in her brother; Colon cancer (age of onset: 71) in her mother; Heart attack (age of onset: 28) in her father; Heart disease in her brother; Kidney disease in her daughter; Ulcers in her brother. There is no history of Esophageal cancer or Stomach cancer.  ROS:   Please see the history of present illness.    *** All other systems reviewed and are negative.  EKGs/Labs/Other Studies Reviewed:    The following studies were reviewed today: ***      Recent Labs: 07/10/2023: TSH 0.71 12/19/2023: ALT 11; BUN 19; Creatinine, Ser 0.78; Hemoglobin 14.1; Platelets 349.0; Potassium 4.2; Sodium 140  Recent Lipid Panel    Component Value Date/Time   CHOL 161 10/19/2023 1210   TRIG 130.0 10/19/2023 1210   HDL 51.30 10/19/2023 1210   CHOLHDL 3 10/19/2023 1210   VLDL 26.0 10/19/2023 1210   LDLCALC 84 10/19/2023 1210   LDLCALC 62 10/31/2022 1055     Risk Assessment/Calculations:   {Does this patient have ATRIAL  FIBRILLATION?:[id number]}  No BP recorded.  {Refresh Note OR Click here to enter BP  :1}***         Physical Exam:    VS:  There were no vitals taken for this visit.    Wt Readings from  Last 3 Encounters:  12/20/23 147 lb 11.2 oz (67 kg)  12/19/23 147 lb 12.8 oz (67 kg)  10/30/23 146 lb 6 oz (66.4 kg)     GEN: *** Well nourished, well developed in no acute distress HEENT: Normal NECK: No JVD; No carotid bruits LYMPHATICS: No lymphadenopathy CARDIAC: ***RRR, no murmurs, rubs, gallops RESPIRATORY:  Clear to auscultation without rales, wheezing or rhonchi  ABDOMEN: Soft, non-tender, non-distended MUSCULOSKELETAL:  No edema; No deformity  SKIN: Warm and dry NEUROLOGIC:  Alert and oriented x 3 PSYCHIATRIC:  Normal affect   ASSESSMENT:    No diagnosis found. PLAN:    In order of problems listed above:  CAD  EKG  Symptoms    HTN  Aortic atherosclerosis  HLD       {Are you ordering a CV Procedure (e.g. stress test, cath, DCCV, TEE, etc)?   Press F2        :[id number]}    Medication Adjustments/Labs and Tests Ordered: Current medicines are reviewed at length with the patient today.  Concerns regarding medicines are outlined above.  No orders of the defined types were placed in this encounter.  No orders of the defined types were placed in this encounter.   There are no Patient Instructions on file for this visit.   Signed, Miriam FORBES Shams, NP  01/18/2024 8:19 PM    [facility]

## 2024-01-19 ENCOUNTER — Ambulatory Visit: Admitting: Physician Assistant

## 2024-01-29 ENCOUNTER — Ambulatory Visit: Attending: Physician Assistant | Admitting: Physician Assistant

## 2024-01-29 ENCOUNTER — Encounter: Payer: Self-pay | Admitting: Physician Assistant

## 2024-01-29 VITALS — BP 120/60 | HR 68 | Ht 63.5 in | Wt 145.0 lb

## 2024-01-29 DIAGNOSIS — E782 Mixed hyperlipidemia: Secondary | ICD-10-CM | POA: Diagnosis not present

## 2024-01-29 DIAGNOSIS — I10 Essential (primary) hypertension: Secondary | ICD-10-CM | POA: Diagnosis not present

## 2024-01-29 DIAGNOSIS — I2510 Atherosclerotic heart disease of native coronary artery without angina pectoris: Secondary | ICD-10-CM | POA: Diagnosis not present

## 2024-01-29 MED ORDER — PRAVASTATIN SODIUM 20 MG PO TABS: 20.0000 mg | ORAL_TABLET | Freq: Every evening | ORAL | 3 refills | Status: AC

## 2024-01-29 NOTE — Progress Notes (Signed)
 OFFICE NOTE:    Date:  01/29/2024  ID:  [name] [name], DOB [birth year], MRN [id number] PCP: Alvia Corean LITTIE, FNP  [facility] Providers Cardiologist:  Oneil Parchment, MD        Coronary artery disease  S/p POBA to D1 in 2003 MPI 2016: apical anterior infarct, no ischemia TTE 08/2014: EF 55-60, normal wall motion, mild BAE, atrial septal lipomatous hypertrophy, PASP 35  MPI 09/16/22: ant-sept, ant, apical scar; no ischemia; EF 63 TTE 10/07/22: EF 55-60, no RWMA, mild LVH, Gr 1 DD, NL RVSF, NL PASP (RVSP 27), mild to mod LAE, trivial MR ABIs 12/2023: R 1.41; L non compressible  Hypertension  Hyperlipidemia  Chronic kidney disease  History of TIA MGUS GERD Asthma  Aortic atherosclerosis         Discussed the use of AI scribe software for clinical note transcription with the patient, who gave verbal consent to proceed. History of Present Illness [name] [name] is a 88 y.o. female who returns for follow up of CAD. She was last seen May 2024 by Dr. Parchment.  She noted occasional chest tightness and shortness of breath.  Follow-up echocardiogram was obtained and demonstrated normal LV function and no significant valvular heart disease.  Nuclear stress test demonstrated prior scar but no ischemia.  She is here with her husband.  She has not had chest discomfort, pressure, shortness of breath, dizziness, or syncope. No leg swelling, but she notes leg pain that occurs in the evening, particularly after sitting post-dinner. An ultrasound showed no blood flow issues, and she is scheduled for a nerve study. She was previously on pravastatin  for cholesterol management, which was stopped possibly due to elevated liver enzymes during a hospitalization in 2021.  Her liver enzymes have been normal since. She exercises by walking up and down her driveway three to four times a day with a walker.    ROS-See HPI    Studies Reviewed:      07/10/2023: TSH 0.71 12/19/2023: K 4.2, creatinine  0.78, ALT 11, total cholesterol 161, HDL 51.3, LDL 84, triglycerides 130, Hgb 14.1         Physical Exam:  VS:  BP 120/60   Pulse 68   Ht 5' 3.5 (1.613 m)   Wt 145 lb (65.8 kg)   SpO2 98%   BMI 25.28 kg/m        Wt Readings from Last 3 Encounters:  01/29/24 145 lb (65.8 kg)  12/20/23 147 lb 11.2 oz (67 kg)  12/19/23 147 lb 12.8 oz (67 kg)    Constitutional:      Appearance: Healthy appearance. Not in distress.  Pulmonary:     Breath sounds: Normal breath sounds. No wheezing. No rales.  Cardiovascular:     Normal rate. Regular rhythm.     Murmurs: There is no murmur.  Edema:    Peripheral edema absent.  Abdominal:     Palpations: Abdomen is soft.       Assessment and Plan:    Assessment & Plan Coronary artery disease involving native coronary artery of native heart without angina pectoris History of balloon angioplasty to the first diagonal in 2003.  Nuclear stress test May 2024 with anterior septal, anterior and apical scar but no ischemia.  She is doing well without chest discomfort to suggest angina.  She is no longer on statin therapy for unclear reasons.  She did have elevated LFTs during a hospitalization 2021.  She has had normal liver enzymes  since.  She had an LDL above goal in June 2025. -Restart pravastatin  20 mg daily -Continue ASA 81 mg daily, nitroglycerin  as needed -Follow-up 1 year Essential hypertension Blood pressure controlled. -Continue amlodipine  10 mg daily, hydralazine  25 mg 3 times a day, propranolol  20 mg twice daily Hyperlipidemia, mixed LDL above goal in August 2025 at 38.  He is no longer on pravastatin  as noted. -Restart pravastatin  20 mg daily -Obtain hepatic function panel 4 weeks to recheck LFTs         Dispo:  Return in about 1 year (around 01/28/2025) for Routine Follow Up, w/ Dr. Jeffrie, or Glendia Ferrier, PA-C.  Signed, Glendia Ferrier, PA-C

## 2024-01-29 NOTE — Patient Instructions (Signed)
 Medication Instructions:  Start Pravastatin  20 mg daily at bedtime   *If you need a refill on your cardiac medications before your next appointment, please call your pharmacy*  Lab Work: In 4 weeks: Hepatic function panel You can come to the Lab on the 1st floor on or around 02/28/24. You do not need an appointment.   If you have labs (blood work) drawn today and your tests are completely normal, you will receive your results only by: MyChart Message (if you have MyChart) OR A paper copy in the mail If you have any lab test that is abnormal or we need to change your treatment, we will call you to review the results.   Follow-Up: At [facility], you and your health needs are our priority.  As part of our continuing mission to provide you with exceptional heart care, our providers are all part of one team.  This team includes your primary Cardiologist (physician) and Advanced Practice Providers or APPs (Physician Assistants and Nurse Practitioners) who all work together to provide you with the care you need, when you need it.  Your next appointment:   12 month(s)  Provider:   Oneil Parchment, MD or Glendia Ferrier, PA-C          We recommend signing up for the patient portal called MyChart.  Sign up information is provided on this After Visit Summary.  MyChart is used to connect with patients for Virtual Visits (Telemedicine).  Patients are able to view lab/test results, encounter notes, upcoming appointments, etc.  Non-urgent messages can be sent to your provider as well.   To learn more about what you can do with MyChart, go to [email or web address].   Other Instructions

## 2024-01-29 NOTE — Assessment & Plan Note (Signed)
 Blood pressure controlled. -Continue amlodipine  10 mg daily, hydralazine  25 mg 3 times a day, propranolol  20 mg twice daily

## 2024-01-29 NOTE — Assessment & Plan Note (Addendum)
 History of balloon angioplasty to the first diagonal in 2003.  Nuclear stress test May 2024 with anterior septal, anterior and apical scar but no ischemia.  She is doing well without chest discomfort to suggest angina.  She is no longer on statin therapy for unclear reasons.  She did have elevated LFTs during a hospitalization 2021.  She has had normal liver enzymes since.  She had an LDL above goal in June 2025. -Restart pravastatin  20 mg daily -Continue ASA 81 mg daily, nitroglycerin  as needed -Follow-up 1 year

## 2024-01-29 NOTE — Assessment & Plan Note (Addendum)
 LDL above goal in August 2025 at 38.  He is no longer on pravastatin  as noted. -Restart pravastatin  20 mg daily -Obtain hepatic function panel 4 weeks to recheck LFTs

## 2024-02-05 DIAGNOSIS — M79604 Pain in right leg: Secondary | ICD-10-CM | POA: Diagnosis not present

## 2024-02-05 DIAGNOSIS — S72301D Unspecified fracture of shaft of right femur, subsequent encounter for closed fracture with routine healing: Secondary | ICD-10-CM | POA: Diagnosis not present

## 2024-02-05 NOTE — Progress Notes (Unsigned)
 Subjective:   Patient ID: [name] [name], female    DOB: 1936-05-04    MRN: [id number]   Brief patient profile:  17 yowf  never smoker with large hiatal hernia & recurrent asthmatic bronchitis , about twice/yr for many yrs. Lung parenchyma looks ok on Ct scans 10/2006 & 12/10. PFTs nml in 12/09 & mild airway obstruction in 08/2009.  First presented 3/09 . CT neck for thyroid  imaging s/o mediastinal contour abnormal. Ct chest >> large hiatal hernia & BL upper lobe hazy , ground glass opacity.  Dec, 2010  [facility])  Fe def anemia requiring transfusions, small bowel AVMs suspected.  Mar 2011 Acute visit- c/o cough and wheezing >> augmentin  helped, enalapril  changed to diovan     09/08/2017 1st  office visit/ [name]  Re AB  Chief Complaint  Patient presents with   Pulmonary Consult    Last seen here in July 2015. She states that she is here for just check up. Breathing is overall doing well.  She rarely uses her albuterol  inhaler.   uses dulera  200 when over does it  Sleeping s resp problems  No cough  Walks at [employer] at a slow pace with a cane and not really limited by doe   rec In the event of cough/ wheeze or worse short of breath > dulera   100 or 200 up to 2 pffs every 12 hours Work on inhaler technique:   Plan B = Backup Only use your albuterol  as a rescue medication Plan C = Crisis - only use your albuterol  nebulizer if you first try Plan B and it fails to help > ok to use the nebulizer up to every 4 hours but if start needing it regularly call for immediate appointment   07/12/2022  f/u ov/Aldine Chakraborty re: AB/gerd   maint on ppi bid / no ICS (can't afford )   Chief Complaint  Patient presents with   Follow-up    Breathing has been worse over the past wk- started after cleaning a dusty room, she was unable to get the Dulera  due to cost, using albuterol  inhaler 3-4 x per day. She has had prod cough with green/yellow, thick sputum and has noticed some wheezing.   Dyspnea:  [employer] walking  still  Cough: worst 1st thing in am turing thicker and slt green  Sleeping: on bed blocks  SABA use: rarely  02: none  Rec Prednisone  10 mg take  4 each am x 2 days,   2 each am x 2 days,  1 each am x 2 days and stop  Mucinex  dm 1200 mg every 12 hours as needed Plan A = Automatic = Always=   albuterol  nebulizer with budesonide  0.25 twice daily and if getting worse increase to 4 daily  Plan B = Backup (to supplement plan A, not to replace it) Only use your albuterol  inhaler as a rescue medication   08/01/22  Icard rec breztri  > did not fill rx   09/05/2022  f/u ov/[name] re: AB/gerd     Not  symbicort  160 bid but saba instead (confused with maint vs prn) Chief Complaint  Patient presents with   Follow-up    Breathing is overall doing well. She has occ wheezing occ in the am- uses her albuterol  and then just the symbicort  prn.    Dyspnea:  MMRC2 = can't walk a nl pace on a flat grade s sob but does fine slow and flat eg [employer] walking  Cough: none   Sleeping:  bed blocks SABA use: none needed  02: none  Rec Plan A = Automatic = Always=    Symbicort  160 Take 2 puffs first thing in am and then another 2 puffs about 12 hours later or budesonide  with albuterol  twice daily  Work on inhaler technique:    >>>  Remember how golfers warm up by taking practice swings - do this with an empty inhaler  Plan B = Backup (to supplement plan A, not to replace it) Only use your albuterol  inhaler as a rescue medication  Plan C = Crisis (instead of Plan B but only if Plan B stops working) - only use your albuterol  nebulizer if you first try Plan B  Plan D = Deltasone  = prednisone  Refillable:  Prednisone  10 mg take  4 each am x 2 days,   2 each am x 2 days,  1 each am x 2 days and stop    Please schedule a follow up visit in 3 months but call sooner if needed  with all respiratory medications /inhalers/ solutions in hand    02/06/2024  f/u ov/[name] re: AB/GERD  s/p NIFP maint on dulera  200 prn  did not   bring other meds  Chief Complaint  Patient presents with   Medical Management of Chronic Issues   Asthma    Breathing is doing well. She had PNA May 2025.    Dyspnea:  [employer]  whole store slow pace  / hc parking / Cough: none  Sleeping: 6 in  bed blocks with 2 pillows  resp cc  SABA use: confused with what/ when to take saba ( not following written action plan above) but has not needed any prednisone  since last ov  02: none     No obvious day to day or daytime variability or assoc excess/ purulent sputum or mucus plugs or hemoptysis or cp or chest tightness, subjective wheeze or overt sinus or hb symptoms.    Also denies any obvious fluctuation of symptoms with weather or environmental changes or other aggravating or alleviating factors except as outlined above   No unusual exposure hx or h/o childhood pna/ asthma or knowledge of premature birth.  Current Allergies, Complete Past Medical History, Past Surgical History, Family History, and Social History were reviewed in [employer] record.  ROS  The following are not active complaints unless bolded Hoarseness, sore throat, dysphagia, dental problems, itching, sneezing,  nasal congestion or discharge of excess mucus or purulent secretions, ear ache,   fever, chills, sweats, unintended wt loss or wt gain, classically pleuritic or exertional cp,  orthopnea pnd or arm/hand swelling  or leg swelling, presyncope, palpitations, abdominal pain, anorexia, nausea, vomiting, diarrhea  or change in bowel habits or change in bladder habits, change in stools or change in urine, dysuria, hematuria,  rash, arthralgias, visual complaints, headache, numbness, weakness or ataxia or problems with walking or coordination,  change in mood or  memory.        Current Meds  Medication Sig   acetaminophen  (TYLENOL ) 325 MG tablet Take 2 tablets (650 mg total) by mouth every 6 (six) hours as needed for mild pain (pain score 1-3) or moderate  pain (pain score 4-6) (or Fever >/= 101).   albuterol  (PROVENTIL ) (2.5 MG/3ML) 0.083% nebulizer solution Take 3 mLs (2.5 mg total) by nebulization every 4 (four) hours as needed for wheezing or shortness of breath.   albuterol  (VENTOLIN  HFA) 108 (90 Base) MCG/ACT inhaler Inhale 2 puffs into the lungs  as needed for wheezing or shortness of breath.   amLODipine  (NORVASC ) 10 MG tablet Take  1 tablet Daikly for BP & Heart                                                                        /                                                                   TAKE                                         BY                                                 MOUTH   aspirin  EC 81 MG tablet Take 81 mg by mouth at bedtime. Every other night   benzonatate  (TESSALON  PERLES) 100 MG capsule Take 1 capsule (100 mg total) by mouth every 6 (six) hours as needed for cough.   budesonide  (PULMICORT ) 0.25 MG/2ML nebulizer solution USE 1 VIAL IN NEBULIZER 4 TIMES DAILY   busPIRone  (BUSPAR ) 10 MG tablet Take 1/2 to 1 tablet 3 x /day if Needed for Anxiety or Panic Attack  (( Replaces Xanax  ))   Cholecalciferol  (VITAMIN D3) 5000 units CAPS Take 5,000 Units by mouth 2 (two) times daily.    gabapentin  (NEURONTIN ) 300 MG capsule Take 1 capsule (300 mg total) by mouth 4 (four) times daily as needed.   hydrALAZINE  (APRESOLINE ) 25 MG tablet Take 1 tablet (25 mg total) by mouth 3 (three) times daily.   ketoconazole  (NIZORAL ) 2 % cream APPLY TO RINGWORM  RASH TWICE A DAY   Magnesium  400 MG TABS Take 400 mg by mouth daily at 2 PM.   melatonin 5 MG TABS Take 5 mg by mouth at bedtime as needed.   mometasone -formoterol  (DULERA ) 200-5 MCG/ACT AERO Take 2 puffs first thing in am and then another 2 puffs about 12 hours later.   nitroGLYCERIN  (NITROSTAT ) 0.4 MG SL tablet Place 1 tablet (0.4 mg total) under the tongue every 5 (five) minutes as needed for chest pain.   pantoprazole  (PROTONIX ) 40 MG tablet Take  1 tablet  Daily  to Prevent  Heartburn &  Indigestion   polyethylene glycol (MIRALAX  / GLYCOLAX ) 17 g packet Take 17 g by mouth daily.   pravastatin  (PRAVACHOL ) 20 MG tablet Take 1 tablet (20 mg total) by mouth every evening.   promethazine  (PHENERGAN ) 25 MG tablet Take  1 tablet   4 x / day  before Meals & Bedtime  as needed for Nausea   propranolol  (INDERAL ) 20 MG tablet Take 1 tablet (20 mg total) by mouth 2 (two) times daily.   rOPINIRole  (REQUIP ) 2 MG tablet Take 0.5 tablets (1  mg total) by mouth at bedtime.   senna-docusate (SENOKOT-S) 8.6-50 MG tablet Take 2 tablets by mouth 2 (two) times daily.   solifenacin (VESICARE) 5 MG tablet Take 5 mg by mouth daily.   sucralfate  (CARAFATE ) 1 g tablet Take 1 g by mouth 4 (four) times daily -  with meals and at bedtime.   traMADol  (ULTRAM ) 50 MG tablet Take 1 tablet (50 mg total) by mouth 2 (two) times daily as needed.   vitamin B-12 (CYANOCOBALAMIN ) 1000 MCG tablet Take 5,000 mcg by mouth daily.                   Objective:   Physical Exam  wts   02/06/2024       146   07/12/2022          143  05/16/2022          142  03/22/2022       144   02/25/2022      147  01/28/2022        148 12/14/2021          144   05/12/2021          143  10/14/2020          150 09/02/2020        152   09/08/2017         164   11/19/13 132 lb (59.875 kg)  11/14/13 127 lb (57.607 kg)  11/05/13 125 lb (56.7 kg)      Vital signs reviewed  02/06/2024  - Note at rest 02 sats  97% on RA   General appearance:    pleasant amb wm     HEENT : Oropharynx  clear / edentulous     Nasal turbinates nl    NECK :  without  apparent JVD/ palpable Nodes/TM    LUNGS: no acc muscle use,  Kyphotic contous chest which is clear to A and P bilaterally without cough on insp or exp maneuvers   CV:  RRR  no s3 or murmur or increase in P2, and no edema   ABD:  soft and nontender   MS:  Gait nl   ext warm without deformities Or obvious joint restrictions  calf tenderness, cyanosis or clubbing    SKIN: warm  and dry without lesions    NEURO:  alert, approp, nl sensorium with  no motor or cerebellar deficits apparent.     I personally reviewed images and agree with radiology impression as follows:  CXR:   pa and lateral 12/19/23  Minimal right middle lobe subsegmental atelectasis or scarring.    Assessment & Plan:   Assessment & Plan Mild intermittent asthma without complication  DOE (dyspnea on exertion) 09/08/2017  Walked RA x 3 laps @ 185 ft each stopped due to  End of study, nl pace, no sob or desat    - Spirometry 09/08/2017  FEV1 1.72 (91%)  Ratio 76 with min curvature on no rx   - 02/06/2024  After extensive coaching inhaler device,  effectiveness =  75% (short Ti and late insp)      Despite suboptimal hfa technique and adherence to written action plan, All goals of chronic asthma control met including optimal function and elimination of symptoms with minimal need for rescue therapy.  Contingencies discussed in full including contacting this office immediately if not controlling the symptoms using the rule of two's.    Nebulizer is needed as part of the backup  plan  Since won't stay on dulera  200 when feeling fine, I changed her plan to accommodate prn when doing great with the stipulation she immediately change it back to full dose for any breakthru symptoms on increased need for saba in any form.  F/u can be q 64m, sooner prn          Each maintenance medication was reviewed in detail including emphasizing most importantly the difference between maintenance and prns and under what circumstances the prns are to be triggered using an action plan format where appropriate.  Total time for H and P, chart review, counseling, reviewing hfa/ neb  device(s) and generating customized AVS unique to this office visit / same day charting = 35 min          AVS  Patient Instructions  Plan A = Automatic = Always=    dulera  200 up  to 2 puff every 12 hours -  if  doing great can take as needed    Plan B = Backup (to supplement plan A, not to replace it) Use your albuterol  inhaler as a rescue medication to be used if you can't catch your breath by resting or slowing your pace  or doing a relaxed purse lip breathing pattern.  - The less you use it, the better it will work when you need it. - Ok to use the inhaler up to 2 puffs  every 4 hours if you must but call for appointment if use goes up over your usual need - Don't leave home without it !!  (think of it like the spare tire or starter fluid for your car)   Plan C = Crisis (instead of Plan B but only if Plan B stops working) - only use your albuterol  nebulizer if you first try Plan B and it fails to help > ok to use the nebulizer up to every 4 hours but if start needing it regularly call for immediate appointment   Please schedule a follow up visit in 12  months but call sooner if needed       Ozell America, MD 02/06/2024

## 2024-02-06 ENCOUNTER — Encounter: Payer: Self-pay | Admitting: Internal Medicine

## 2024-02-06 ENCOUNTER — Ambulatory Visit: Admitting: Internal Medicine

## 2024-02-06 VITALS — BP 130/66 | HR 62 | Ht 63.5 in | Wt 146.0 lb

## 2024-02-06 DIAGNOSIS — R0609 Other forms of dyspnea: Secondary | ICD-10-CM

## 2024-02-06 DIAGNOSIS — J4520 Mild intermittent asthma, uncomplicated: Secondary | ICD-10-CM

## 2024-02-06 NOTE — Assessment & Plan Note (Addendum)
 09/08/2017  Walked RA x 3 laps @ 185 ft each stopped due to  End of study, nl pace, no sob or desat    - Spirometry 09/08/2017  FEV1 1.72 (91%)  Ratio 76 with min curvature on no rx   - 02/06/2024  After extensive coaching inhaler device,  effectiveness =  75% (short Ti and late insp)      Despite suboptimal hfa technique and adherence to written action plan, All goals of chronic asthma control met including optimal function and elimination of symptoms with minimal need for rescue therapy.  Contingencies discussed in full including contacting this office immediately if not controlling the symptoms using the rule of two's.    Nebulizer is needed as part of the backup plan  Since won't stay on dulera  200 when feeling fine, I changed her plan to accommodate prn when doing great with the stipulation she immediately change it back to full dose for any breakthru symptoms on increased need for saba in any form.  F/u can be q 56m, sooner prn          Each maintenance medication was reviewed in detail including emphasizing most importantly the difference between maintenance and prns and under what circumstances the prns are to be triggered using an action plan format where appropriate.  Total time for H and P, chart review, counseling, reviewing hfa/ neb  device(s) and generating customized AVS unique to this office visit / same day charting = 35 min

## 2024-02-06 NOTE — Patient Instructions (Addendum)
 Plan A = Automatic = Always=    dulera  200 up  to 2 puff every 12 hours -  if  doing great can take as needed   Plan B = Backup (to supplement plan A, not to replace it) Use your albuterol  inhaler as a rescue medication to be used if you can't catch your breath by resting or slowing your pace  or doing a relaxed purse lip breathing pattern.  - The less you use it, the better it will work when you need it. - Ok to use the inhaler up to 2 puffs  every 4 hours if you must but call for appointment if use goes up over your usual need - Don't leave home without it !!  (think of it like the spare tire or starter fluid for your car)   Plan C = Crisis (instead of Plan B but only if Plan B stops working) - only use your albuterol  nebulizer if you first try Plan B and it fails to help > ok to use the nebulizer up to every 4 hours but if start needing it regularly call for immediate appointment   Please schedule a follow up visit in 12  months but call sooner if needed

## 2024-02-13 ENCOUNTER — Ambulatory Visit: Payer: PPO | Admitting: Internal Medicine

## 2024-02-20 ENCOUNTER — Ambulatory Visit: Payer: Self-pay

## 2024-02-20 ENCOUNTER — Ambulatory Visit: Admitting: Emergency Medicine

## 2024-02-20 ENCOUNTER — Encounter: Payer: Self-pay | Admitting: Emergency Medicine

## 2024-02-20 VITALS — BP 112/64 | HR 60 | Temp 98.1°F | Ht 63.5 in | Wt 146.0 lb

## 2024-02-20 DIAGNOSIS — R6889 Other general symptoms and signs: Secondary | ICD-10-CM | POA: Diagnosis not present

## 2024-02-20 DIAGNOSIS — J22 Unspecified acute lower respiratory infection: Secondary | ICD-10-CM | POA: Insufficient documentation

## 2024-02-20 MED ORDER — AMOXICILLIN-POT CLAVULANATE 875-125 MG PO TABS: 1.0000 | ORAL_TABLET | Freq: Two times a day (BID) | ORAL | 0 refills | Status: AC

## 2024-02-20 NOTE — Assessment & Plan Note (Addendum)
 Clinically stable.  No red flag signs or symptoms. Had pneumonia last year.  No signs of pneumonia at this time around Recommend Augmentin  875 mg twice a day for 7 days Symptom management discussed. Advised to rest and stay well-hydrated ED precautions given Advised to contact the office if no better or worse during the next several days

## 2024-02-20 NOTE — Patient Instructions (Signed)
 Acute Bronchitis, Adult  Acute bronchitis is when air tubes in the lungs (bronchi) suddenly get swollen. The condition can make it hard for you to breathe. In adults, acute bronchitis usually goes away within 2 weeks. A cough caused by bronchitis may last up to 3 weeks. Smoking, allergies, and asthma can make the condition worse. What are the causes? Germs that cause cold and flu (viruses). The most common cause of this condition is the virus that causes the common cold. Bacteria. Substances that bother (irritate) the lungs, including: Smoke from cigarettes and other types of tobacco. Dust and pollen. Fumes from chemicals, gases, or burned fuel. Indoor or outdoor air pollution. What increases the risk? A weak body's defense system. This is also called the immune system. Any condition that affects your lungs and breathing, such as asthma. What are the signs or symptoms? A cough. Coughing up clear, yellow, or green mucus. Making high-pitched whistling sounds when you breathe, most often when you breathe out (wheezing). Runny or stuffy nose. Having too much mucus in your lungs (chest congestion). Shortness of breath. Body aches. A sore throat. How is this treated? Acute bronchitis may go away over time without treatment. Your doctor may tell you to: Drink more fluids. This will help thin your mucus so it is easier to cough up. Use a device that gets medicine into your lungs (inhaler). Use a vaporizer or a humidifier. These are machines that add water to the air. This helps with coughing and poor breathing. Take a medicine that thins mucus and helps clear it from your lungs. Take a medicine that prevents or stops coughing. It is not common to take an antibiotic medicine for this condition. Follow these instructions at home:  Take over-the-counter and prescription medicines only as told by your doctor. Use an inhaler, vaporizer, or humidifier as told by your doctor. Take two teaspoons  (10 mL) of honey at bedtime. This helps lessen your coughing at night. Drink enough fluid to keep your pee (urine) pale yellow. Do not smoke or use any products that contain nicotine or tobacco. If you need help quitting, ask your doctor. Get a lot of rest. Return to your normal activities when your doctor says that it is safe. Keep all follow-up visits. How is this prevented?  Wash your hands often with soap and water for at least 20 seconds. If you cannot use soap and water, use hand sanitizer. Avoid contact with people who have cold symptoms. Try not to touch your mouth, nose, or eyes with your hands. Avoid breathing in smoke or chemical fumes. Make sure to get the flu shot every year. Contact a doctor if: Your symptoms do not get better in 2 weeks. You have trouble coughing up the mucus. Your cough keeps you awake at night. You have a fever. Get help right away if: You cough up blood. You have chest pain. You have very bad shortness of breath. You faint or keep feeling like you are going to faint. You have a very bad headache. Your fever or chills get worse. These symptoms may be an emergency. Get help right away. Call your local emergency services (911 in the [place] Do not wait to see if the symptoms will go away. Do not drive yourself to the hospital. Summary Acute bronchitis is when air tubes in the lungs (bronchi) suddenly get swollen. In adults, acute bronchitis usually goes away within 2 weeks. Drink more fluids. This will help thin your mucus so it is easier  to cough up. Take over-the-counter and prescription medicines only as told by your doctor. Contact a doctor if your symptoms do not improve after 2 weeks of treatment. This information is not intended to replace advice given to you by your health care provider. Make sure you discuss any questions you have with your health care provider. Document Revised: 08/26/2020 Document Reviewed: 08/26/2020 Elsevier Patient  Education  2024 [employer].

## 2024-02-20 NOTE — Assessment & Plan Note (Signed)
 Symptom management discussed Advised to rest and stay well-hydrated Continue Tylenol  as needed Advised to contact the office if no better or worse during next several days

## 2024-02-20 NOTE — Telephone Encounter (Signed)
 FYI Only or Action Required?: FYI only for provider.  Patient was last seen in primary care on 12/19/2023 by Alvia Corean CROME, FNP.  Called Nurse Triage reporting Cough. - yellow green Phlegm  Symptoms began 2 days ago.  Interventions attempted: Nothing.  Symptoms are: gradually worsening. Hx of bronchitis.  Triage Disposition: See HCP Within 4 Hours (Or PCP Triage)  Patient/caregiver understands and will follow disposition?: Yes - appt for this afternoon                   Copied from CRM #[id number]. Topic: Clinical - Red Word Triage >> Feb 20, 2024  9:58 AM Tinnie BROCKS wrote: Red Word that prompted transfer to Nurse Triage: Feels like she might be getting pneumonia again (had it last year), yellow and green sputum Reason for Disposition  Wheezing is present  Answer Assessment - Initial Assessment Questions 1. ONSET: When did the cough begin?      2 days 2. SEVERITY: How bad is the cough today?      Mostly when she gets up - not that often 3. SPUTUM: Describe the color of your sputum (e.g., none, dry cough; clear, white, yellow, green)     Yellow/green 4. HEMOPTYSIS: Are you coughing up any blood? If Yes, ask: How much? (e.g., flecks, streaks, tablespoons, etc.)     no 5. DIFFICULTY BREATHING: Are you having difficulty breathing? If Yes, ask: How bad is it? (e.g., mild, moderate, severe)      Used inhaler 6. FEVER: Do you have a fever? If Yes, ask: What is your temperature, how was it measured, and when did it start?     no 7. CARDIAC HISTORY: Do you have any history of heart disease? (e.g., heart attack, congestive heart failure)      Diseased heart 8. LUNG HISTORY: Do you have any history of lung disease?  (e.g., pulmonary embolus, asthma, emphysema)     Bronchitis  10. OTHER SYMPTOMS: Do you have any other symptoms? (e.g., runny nose, wheezing, chest pain)  Might have a little wheezing  Protocols used: Cough - Acute  Productive-A-AH

## 2024-02-20 NOTE — Progress Notes (Signed)
 [name] [name] 88 y.o.   Chief Complaint  Patient presents with   Cough    Patient states having green mucus that started this week. No fever, no body aches, no head aches, slight cough. Patient is only taking tylenol  and perles to help. Has not did done at home covid or flu test     HISTORY OF PRESENT ILLNESS: This is a 88 y.o. female complaining of flulike symptoms that started last week Mostly complaining of productive cough.  Denies fever or chills.  Denies difficulty breathing or chest pain No other associated symptoms No other complaints or medical concerns today.  Cough Pertinent negatives include no chest pain, chills, fever, headaches, rash or shortness of breath.     Prior to Admission medications   Medication Sig Start Date End Date Taking? Authorizing Provider  acetaminophen  (TYLENOL ) 325 MG tablet Take 2 tablets (650 mg total) by mouth every 6 (six) hours as needed for mild pain (pain score 1-3) or moderate pain (pain score 4-6) (or Fever >/= 101). 06/02/23  Yes Deward Eck, PA-C  albuterol  (PROVENTIL ) (2.5 MG/3ML) 0.083% nebulizer solution Take 3 mLs (2.5 mg total) by nebulization every 4 (four) hours as needed for wheezing or shortness of breath. 10/01/23  Yes Ellouise Fine K, DO  albuterol  (VENTOLIN  HFA) 108 (90 Base) MCG/ACT inhaler Inhale 2 puffs into the lungs as needed for wheezing or shortness of breath. 10/01/23  Yes Ellouise, Turkey K, DO  amLODipine  (NORVASC ) 10 MG tablet Take  1 tablet Daikly for BP & Heart                                                                        /                                                                   TAKE                                         BY                                                 MOUTH 10/19/23  Yes Alvia Corean LITTIE, FNP  aspirin  EC 81 MG tablet Take 81 mg by mouth at bedtime. Every other night   Yes [provider]  benzonatate  (TESSALON  PERLES) 100 MG capsule Take 1 capsule (100 mg  total) by mouth every 6 (six) hours as needed for cough. 05/12/23 05/11/24 Yes Wilkinson, Dana E, NP  budesonide  (PULMICORT ) 0.25 MG/2ML nebulizer solution USE 1 VIAL IN NEBULIZER 4 TIMES DAILY 10/09/23  Yes Darlean Ozell NOVAK, MD  busPIRone  (BUSPAR ) 10 MG tablet Take 1/2 to 1 tablet 3 x /day if Needed for Anxiety or Panic Attack  (( Replaces Xanax  ))  10/15/22  Yes Tonita Fallow, MD  Cholecalciferol  (VITAMIN D3) 5000 units CAPS Take 5,000 Units by mouth 2 (two) times daily.    Yes [provider]  gabapentin  (NEURONTIN ) 300 MG capsule Take 1 capsule (300 mg total) by mouth 4 (four) times daily as needed. 08/23/23  Yes Alvia Corean CROME, FNP  hydrALAZINE  (APRESOLINE ) 25 MG tablet Take 1 tablet (25 mg total) by mouth 3 (three) times daily. 06/02/23  Yes Rojelio Nest, DO  ketoconazole  (NIZORAL ) 2 % cream APPLY TO RINGWORM  RASH TWICE A DAY 01/18/23  Yes Cranford, Tonya, NP  Magnesium  400 MG TABS Take 400 mg by mouth daily at 2 PM.   Yes [provider]  melatonin 5 MG TABS Take 5 mg by mouth at bedtime as needed.   Yes [provider]  mometasone -formoterol  (DULERA ) 200-5 MCG/ACT AERO Take 2 puffs first thing in am and then another 2 puffs about 12 hours later. 09/08/22  Yes Darlean Ozell NOVAK, MD  nitroGLYCERIN  (NITROSTAT ) 0.4 MG SL tablet Place 1 tablet (0.4 mg total) under the tongue every 5 (five) minutes as needed for chest pain. 09/04/14  Yes Rolan Ezra RAMAN, MD  pantoprazole  (PROTONIX ) 40 MG tablet Take  1 tablet  Daily  to Prevent Heartburn &  Indigestion 02/13/23  Yes Tonita Fallow, MD  polyethylene glycol (MIRALAX  / GLYCOLAX ) 17 g packet Take 17 g by mouth daily. 06/03/23  Yes Rojelio Nest, DO  pravastatin  (PRAVACHOL ) 20 MG tablet Take 1 tablet (20 mg total) by mouth every evening. 01/29/24 04/28/24 Yes Weaver, Scott T, PA-C  promethazine  (PHENERGAN ) 25 MG tablet Take  1 tablet   4 x / day  before Meals & Bedtime  as needed for Nausea 01/16/24  Yes Breeback, Jade L, PA-C   propranolol  (INDERAL ) 20 MG tablet Take 1 tablet (20 mg total) by mouth 2 (two) times daily. 10/19/23 10/18/24 Yes Alvia Corean CROME, FNP  rOPINIRole  (REQUIP ) 2 MG tablet Take 0.5 tablets (1 mg total) by mouth at bedtime. 03/09/23  Yes Wilkinson, Dana E, NP  senna-docusate (SENOKOT-S) 8.6-50 MG tablet Take 2 tablets by mouth 2 (two) times daily. 12/31/19  Yes Sebastian Toribio GAILS, MD  solifenacin (VESICARE) 5 MG tablet Take 5 mg by mouth daily. 07/11/22  Yes [provider]  sucralfate  (CARAFATE ) 1 g tablet Take 1 g by mouth 4 (four) times daily -  with meals and at bedtime.   Yes [provider]  traMADol  (ULTRAM ) 50 MG tablet Take 1 tablet (50 mg total) by mouth 2 (two) times daily as needed. 08/04/23  Yes Alvia Corean CROME, FNP  vitamin B-12 (CYANOCOBALAMIN ) 1000 MCG tablet Take 5,000 mcg by mouth daily.   Yes [provider]    Allergies  Allergen Reactions   Ace Inhibitors Cough    Per Dr Darlean pulmonology 2013   Aspartame Diarrhea and Nausea And Vomiting   Atorvastatin  Other (See Comments)    Muscle aches   Biaxin [Clarithromycin] Other (See Comments)    Unknown reaction : PATIENT CAN TOLERATE Z-PAK.  Is written on patient's paper chart.   Daliresp [Roflumilast] Nausea And Vomiting   Erythromycin  Other (See Comments)    Unknown reaction: PATIENT CAN TOLERATE Z-PAK.  It is written on patient's paper chart.   Levofloxacin  Nausea And Vomiting    Patient Active Problem List   Diagnosis Date Noted   Lower respiratory infection 02/20/2024   Flu-like symptoms 12/18/2023   Hypotension 12/18/2023   Medication management 10/19/2023   CKD  stage 3a, GFR 45-59 ml/min (HCC) 10/08/2023   Overweight with body mass index (BMI) of 25 to 25.9 in adult 10/08/2023   Community acquired pneumonia 09/29/2023   Malnutrition of moderate degree 06/02/2023   Leukocytosis 05/31/2023   Closed right hip fracture (HCC) 05/29/2023   Abnormal CT scan, colon 09/02/2022   LLQ pain  09/02/2022   Nausea and vomiting 09/02/2022   Generalized abdominal pain 07/27/2022   Nausea without vomiting 07/27/2022   Poor appetite 07/27/2022   Iron deficiency 10/16/2020   History of adenomatous polyp of colon 07/21/2020   History of fundoplication    Senile purpura 07/05/2019   Abnormal glucose (prediabetes) 03/01/2018   FHx: heart disease 03/01/2018   CKD (chronic kidney disease) stage 2, GFR 60-89 ml/min 09/27/2017   DOE (dyspnea on exertion) 09/08/2017   Chronic bronchitis (HCC) 08/09/2017   Atherosclerosis of aorta (HCC) by Lumbar CYT scan 01/29/2021 04/05/2017   Odynophagia    Spinal stenosis, lumbar region, with neurogenic claudication 05/29/2015   DDD (degenerative disc disease), lumbar 04/21/2015   Hyperlipidemia, mixed 11/24/2014   Gastroesophageal reflux disease 11/24/2014   Depression, major, in partial remission 09/26/2014   Chronic pain syndrome 09/26/2014   Coronary artery disease involving native heart without angina pectoris    Osteoporosis 02/10/2014   Vitamin D  deficiency 08/14/2013   MGUS (monoclonal gammopathy of unknown significance) 03/05/2013   ESOPHAGEAL STRICTURE 08/28/2008   Mild intermittent asthma without complication 07/29/2008   Incarcerated recurrent paraesophageal hiatal hernia s/p lap redo repair [id number] 01/31/2008   Essential hypertension 07/25/2007    Past Medical History:  Diagnosis Date   Anxiety    takes Xanax  daily as needed   Asthma    Albuterol  daily as needed   Blood transfusion without reported diagnosis    CAD (coronary artery disease)    LHC 2003 with 40% pLAD, 30% mLAD, 100% D1 (moderate vessel), 100%  D2 (small vessel), 95% mid small OM2.  Pt had PTCA to D1;  D2 and OM2 small vessels and tx medically;  Last Myoview  (2012): anterior infarct seen with scar, no ischemia. EF normal. Med Rx // Myoview  (12/14):  Low risk; ant scar w/ peri-infarct ischemia; EF 55% with ant AK // Myoview  5/16: EF 55-65, ant, apical scar, no  ischemia    Cataract    Chronic back pain    HNP, DDD, spinal stenosis, vertebral compression fractures.    Chronic nausea    takes Zofran  daily as needed.  normal gastric emptying study 03/2013   CKD (chronic kidney disease), stage III (HCC) 09/27/2017   Constipation    felt to be functional. takes Senokot and Miralax  daily.  treated with Linzess  in past.    DIVERTICULOSIS, COLON 08/28/2008   Qualifier: Diagnosis of  By: Vivian, [name], [name]     Elevated LFTs 2015   probably med induced DILI, from Augmentin  vs Pravastatin    GERD (gastroesophageal reflux disease)    hx of esophageal stricture    Hiatal hernia    Paraesophageal hernias. s/p fundoplications in 2013 and 04/2013   History of bronchitis several of yrs ago   History of colon polyps 03/2009, 03/2011   adenomatous colon polyps, no high grade dysplasia.    History of vertigo    no meds   Hyperlipidemia    takes Pravastatin  daily   Hypertension    takes Amlodipine ,Metoprolol ,and Losartan  daily   Multiple closed pelvic fractures without disruption of pelvic circle (HCC)    Nausea 03/2016   Osteoporosis  PONV (postoperative nausea and vomiting)    yrs ago   Pubic ramus fracture (HCC) 12/20/2019   Slow urinary stream    takes Rapaflo daily   TIA (transient ischemic attack) 1995   no residual effects noted   Vertebral compression fracture (HCC) 06/23/2012    Past Surgical History:  Procedure Laterality Date   APPENDECTOMY     patient unsure of date   BACK SURGERY     BLADDER REPAIR     CHOLECYSTECTOMY     Patient unsure of date.   CORONARY ANGIOPLASTY  11/16/2001   1 stent   ESOPHAGEAL MANOMETRY N/A 03/25/2013   Procedure: ESOPHAGEAL MANOMETRY (EM);  Surgeon: [name], MD;  Location: [facility];  Service: Endoscopy;  Laterality: N/A;   ESOPHAGOGASTRODUODENOSCOPY N/A 03/15/2013   Procedure: ESOPHAGOGASTRODUODENOSCOPY (EGD);  Surgeon: [name], MD;  Location: [facility] ENDOSCOPY;  Service: Endoscopy;   Laterality: N/A;   ESOPHAGOGASTRODUODENOSCOPY N/A 12/25/2015   Procedure: ESOPHAGOGASTRODUODENOSCOPY (EGD);  Surgeon: [name], MD;  Location: [facility] ENDOSCOPY;  Service: Endoscopy;  Laterality: N/A;   EYE SURGERY  11/2000   bilateral cataracts with lens implant   FIXATION KYPHOPLASTY LUMBAR SPINE  X 2   L3-4   HEMORRHOID SURGERY     HIATAL HERNIA REPAIR  06/03/2011   Procedure: LAPAROSCOPIC REPAIR OF HIATAL HERNIA;  Surgeon: [name], MD;  Location: [facility] ORS;  Service: General;  Laterality: N/A;   HIATAL HERNIA REPAIR N/A 04/24/2013   Procedure: LAPAROSCOPIC  REPAIR RECURRENT PARASOPHAGEAL HIATAL HERNIA WITH FUNDOPLICATION;  Surgeon: Elspeth KYM Schultze, MD;  Location: [facility] ORS;  Service: General;  Laterality: N/A;   INSERTION OF MESH N/A 04/24/2013   Procedure: INSERTION OF MESH;  Surgeon: Elspeth KYM Schultze, MD;  Location: [facility];  Service: General;  Laterality: N/A;   INTRAMEDULLARY (IM) NAIL INTERTROCHANTERIC Right 05/30/2023   Procedure: INTRAMEDULLARY (IM) NAIL INTERTROCHANTERIC;  Surgeon: Celena Sharper, MD;  Location: [facility] OR;  Service: Orthopedics;  Laterality: Right;   IR KYPHO THORACIC WITH BONE BIOPSY  09/29/2017   IR KYPHO THORACIC WITH BONE BIOPSY  11/03/2017   IR KYPHO THORACIC WITH BONE BIOPSY  02/14/2019   IR KYPHO THORACIC WITH BONE BIOPSY  04/02/2019   LAPAROSCOPIC LYSIS OF ADHESIONS N/A 04/24/2013   Procedure: LAPAROSCOPIC LYSIS OF ADHESIONS;  Surgeon: Elspeth KYM Schultze, MD;  Location: [facility];  Service: General;  Laterality: N/A;   LAPAROSCOPIC NISSEN FUNDOPLICATION  06/03/2011   Procedure: LAPAROSCOPIC NISSEN FUNDOPLICATION;  Surgeon: [name], MD;  Location: [facility];  Service: General;  Laterality: N/A;   LUMBAR FUSION  12/07/2015   Right L2-3 facetectomy, posterolateral fusion L2-3 with pedicle screws, rods, local bone graft, Vivigen cancellous chips. Fusion extended to the L1 level with pedicle screws and rods   LUMBAR LAMINECTOMY/DECOMPRESSION MICRODISCECTOMY Right  06/26/2012   Procedure: LUMBAR LAMINECTOMY/DECOMPRESSION MICRODISCECTOMY;  Surgeon: Lynwood FORBES Better, MD;  Location: [facility] OR;  Service: Orthopedics;  Laterality: Right;  RIGHT L4-5 MICRODISCECTOMY   LUMBAR LAMINECTOMY/DECOMPRESSION MICRODISCECTOMY N/A 05/29/2015   Procedure: RIGHT L2-3 MICRODISCECTOMY;  Surgeon: Lynwood FORBES Better, MD;  Location: [facility] OR;  Service: Orthopedics;  Laterality: N/A;   TOTAL ABDOMINAL HYSTERECTOMY  1975   partial   UPPER GASTROINTESTINAL ENDOSCOPY      Social History   Socioeconomic History   Marital status: Married    Spouse name: [name]   Number of children: 2   Years of education: 12   Highest education level: Not on file  Occupational History  Occupation: Retired    Associate Professor: RETIRED    Comment: worked at [employer]  Tobacco Use   Smoking status: Never    Passive exposure: Never   Smokeless tobacco: Never  Vaping Use   Vaping status: Never Used  Substance and Sexual Activity   Alcohol use: No   Drug use: Never   Sexual activity: Not Currently    Partners: Male    Birth control/protection: Post-menopausal, Surgical  Other Topics Concern   Not on file  Social History Narrative   Lives with husband.   Right-handed.   3 cups caffeine per day.   Social Drivers of Corporate investment banker Strain: Low Risk  (07/27/2023)   Overall Financial Resource Strain (CARDIA)    Difficulty of Paying Living Expenses: Not hard at all  Food Insecurity: No Food Insecurity (07/27/2023)   Hunger Vital Sign    Worried About Running Out of Food in the Last Year: Never true    Ran Out of Food in the Last Year: Never true  Transportation Needs: No Transportation Needs (07/27/2023)   PRAPARE - Administrator, Civil Service (Medical): No    Lack of Transportation (Non-Medical): No  Physical Activity: Insufficiently Active (07/27/2023)   Exercise Vital Sign    Days of Exercise per Week: 2 days    Minutes of Exercise per Session: 30 min  Stress: Stress  Concern Present (07/27/2023)   [employer] of Occupational Health - Occupational Stress Questionnaire    Feeling of Stress : To some extent  Social Connections: Socially Integrated (07/27/2023)   Social Connection and Isolation Panel    Frequency of Communication with Friends and Family: More than three times a week    Frequency of Social Gatherings with Friends and Family: Once a week    Attends Religious Services: More than 4 times per year    Active Member of [employer] or Organizations: Yes    Attends Banker Meetings: Never    Marital Status: Married  Catering manager Violence: Not At Risk (07/27/2023)   Humiliation, Afraid, Rape, and Kick questionnaire    Fear of Current or Ex-Partner: No    Emotionally Abused: No    Physically Abused: No    Sexually Abused: No    Family History  Problem Relation Age of Onset   Colon cancer Mother 19   Heart attack Father 58       heart attack   Brain cancer Brother        tumor    Heart disease Brother    Ulcers Brother    Arthritis Brother    Kidney disease Daughter    Esophageal cancer Neg Hx    Stomach cancer Neg Hx      Review of Systems  Constitutional: Negative.  Negative for chills and fever.  HENT:  Positive for congestion.   Respiratory:  Positive for cough and sputum production. Negative for shortness of breath.   Cardiovascular: Negative.  Negative for chest pain and palpitations.  Gastrointestinal:  Negative for abdominal pain, diarrhea, nausea and vomiting.  Genitourinary: Negative.  Negative for dysuria and hematuria.  Skin: Negative.  Negative for rash.  Neurological: Negative.  Negative for dizziness and headaches.  All other systems reviewed and are negative.   Vitals:   02/20/24 1425  BP: 112/64  Pulse: 60  Temp: 98.1 F (36.7 C)  SpO2: 94%    Physical Exam Vitals reviewed.  Constitutional:      Appearance: Normal appearance.  HENT:     Head: Normocephalic.     Mouth/Throat:     Mouth:  Mucous membranes are moist.     Pharynx: Oropharynx is clear.  Eyes:     Extraocular Movements: Extraocular movements intact.     Pupils: Pupils are equal, round, and reactive to light.  Cardiovascular:     Rate and Rhythm: Normal rate and regular rhythm.     Pulses: Normal pulses.     Heart sounds: Normal heart sounds.  Pulmonary:     Effort: Pulmonary effort is normal.     Breath sounds: Normal breath sounds.  Musculoskeletal:     Cervical back: No tenderness.  Lymphadenopathy:     Cervical: No cervical adenopathy.  Skin:    General: Skin is warm and dry.     Capillary Refill: Capillary refill takes less than 2 seconds.  Neurological:     General: No focal deficit present.     Mental Status: She is alert and oriented to person, place, and time.  Psychiatric:        Mood and Affect: Mood normal.        Behavior: Behavior normal.      ASSESSMENT & PLAN: Problem List Items Addressed This Visit       Respiratory   Lower respiratory infection - Primary   Clinically stable.  No red flag signs or symptoms. Had pneumonia last year.  No signs of pneumonia at this time around Recommend Augmentin  875 mg twice a day for 7 days Symptom management discussed. Advised to rest and stay well-hydrated ED precautions given Advised to contact the office if no better or worse during the next several days      Relevant Medications   amoxicillin -clavulanate (AUGMENTIN ) 875-125 MG tablet     Other   Flu-like symptoms   Symptom management discussed Advised to rest and stay well-hydrated Continue Tylenol  as needed Advised to contact the office if no better or worse during next several days      Patient Instructions  Acute Bronchitis, Adult  Acute bronchitis is when air tubes in the lungs (bronchi) suddenly get swollen. The condition can make it hard for you to breathe. In adults, acute bronchitis usually goes away within 2 weeks. A cough caused by bronchitis may last up to 3 weeks.  Smoking, allergies, and asthma can make the condition worse. What are the causes? Germs that cause cold and flu (viruses). The most common cause of this condition is the virus that causes the common cold. Bacteria. Substances that bother (irritate) the lungs, including: Smoke from cigarettes and other types of tobacco. Dust and pollen. Fumes from chemicals, gases, or burned fuel. Indoor or outdoor air pollution. What increases the risk? A weak body's defense system. This is also called the immune system. Any condition that affects your lungs and breathing, such as asthma. What are the signs or symptoms? A cough. Coughing up clear, yellow, or green mucus. Making high-pitched whistling sounds when you breathe, most often when you breathe out (wheezing). Runny or stuffy nose. Having too much mucus in your lungs (chest congestion). Shortness of breath. Body aches. A sore throat. How is this treated? Acute bronchitis may go away over time without treatment. Your doctor may tell you to: Drink more fluids. This will help thin your mucus so it is easier to cough up. Use a device that gets medicine into your lungs (inhaler). Use a vaporizer or a humidifier. These are machines that add water to the air.  This helps with coughing and poor breathing. Take a medicine that thins mucus and helps clear it from your lungs. Take a medicine that prevents or stops coughing. It is not common to take an antibiotic medicine for this condition. Follow these instructions at home:  Take over-the-counter and prescription medicines only as told by your doctor. Use an inhaler, vaporizer, or humidifier as told by your doctor. Take two teaspoons (10 mL) of honey at bedtime. This helps lessen your coughing at night. Drink enough fluid to keep your pee (urine) pale yellow. Do not smoke or use any products that contain nicotine or tobacco. If you need help quitting, ask your doctor. Get a lot of rest. Return to  your normal activities when your doctor says that it is safe. Keep all follow-up visits. How is this prevented?  Wash your hands often with soap and water for at least 20 seconds. If you cannot use soap and water, use hand sanitizer. Avoid contact with people who have cold symptoms. Try not to touch your mouth, nose, or eyes with your hands. Avoid breathing in smoke or chemical fumes. Make sure to get the flu shot every year. Contact a doctor if: Your symptoms do not get better in 2 weeks. You have trouble coughing up the mucus. Your cough keeps you awake at night. You have a fever. Get help right away if: You cough up blood. You have chest pain. You have very bad shortness of breath. You faint or keep feeling like you are going to faint. You have a very bad headache. Your fever or chills get worse. These symptoms may be an emergency. Get help right away. Call your local emergency services (911 in the [place] Do not wait to see if the symptoms will go away. Do not drive yourself to the hospital. Summary Acute bronchitis is when air tubes in the lungs (bronchi) suddenly get swollen. In adults, acute bronchitis usually goes away within 2 weeks. Drink more fluids. This will help thin your mucus so it is easier to cough up. Take over-the-counter and prescription medicines only as told by your doctor. Contact a doctor if your symptoms do not improve after 2 weeks of treatment. This information is not intended to replace advice given to you by your health care provider. Make sure you discuss any questions you have with your health care provider. Document Revised: 08/26/2020 Document Reviewed: 08/26/2020 Elsevier Patient Education  2024 Elsevier Inc.     Emil Schaumann, MD [facility]

## 2024-02-28 DIAGNOSIS — I2510 Atherosclerotic heart disease of native coronary artery without angina pectoris: Secondary | ICD-10-CM | POA: Diagnosis not present

## 2024-02-28 DIAGNOSIS — E782 Mixed hyperlipidemia: Secondary | ICD-10-CM | POA: Diagnosis not present

## 2024-02-29 ENCOUNTER — Ambulatory Visit: Payer: Self-pay | Admitting: Physician Assistant

## 2024-02-29 DIAGNOSIS — Z79899 Other long term (current) drug therapy: Secondary | ICD-10-CM

## 2024-02-29 DIAGNOSIS — I2510 Atherosclerotic heart disease of native coronary artery without angina pectoris: Secondary | ICD-10-CM

## 2024-02-29 LAB — HEPATIC FUNCTION PANEL
ALT: 14 IU/L (ref 0–32)
AST: 16 IU/L (ref 0–40)
Albumin: 4.2 g/dL (ref 3.7–4.7)
Alkaline Phosphatase: 84 IU/L (ref 48–129)
Bilirubin Total: 0.4 mg/dL (ref 0.0–1.2)
Bilirubin, Direct: 0.17 mg/dL (ref 0.00–0.40)
Total Protein: 6.6 g/dL (ref 6.0–8.5)

## 2024-03-04 ENCOUNTER — Other Ambulatory Visit: Payer: Self-pay

## 2024-03-04 ENCOUNTER — Ambulatory Visit (INDEPENDENT_AMBULATORY_CARE_PROVIDER_SITE_OTHER): Admitting: Orthopedic Surgery

## 2024-03-04 VITALS — BP 130/58 | HR 71 | Ht 63.5 in | Wt 146.0 lb

## 2024-03-04 DIAGNOSIS — M810 Age-related osteoporosis without current pathological fracture: Secondary | ICD-10-CM | POA: Diagnosis not present

## 2024-03-04 DIAGNOSIS — M545 Low back pain, unspecified: Secondary | ICD-10-CM | POA: Diagnosis not present

## 2024-03-04 NOTE — Progress Notes (Signed)
 Orthopedic Spine Surgery Office Note  Assessment: Patient is a 88 y.o. female with chronic low back pain.  No radicular symptoms   Plan: -Patient has tried Tylenol , gabapentin , pain management -Recommended L4/5 and L5/S1 facet injections for diagnostic and therapeutic purposes -Patient does have kyphosis above her prior fusion and I discussed deformity correction surgery in the fact that there is about a 30% risk of major complication with that.  Patient was not interested in that type of surgery at this point -Referred her to our osteoporosis clinic for her osteoporosis -Patient should return to office in 6 weeks, x-rays at next visit: Scoliosis   Patient expressed understanding of the plan and all questions were answered to the patient's satisfaction.   ___________________________________________________________________________   History:  Patient is a 88 y.o. female who presents today for lumbar spine.  Patient has had several years of low back pain that has gotten progressively worse with time.  She feels it in the lower lumbar region.  She also feels the going out to the buttock region on both hips.  She does not have any pain radiating past that point.  Most of her pain is in the low back.  She has previously been in pain management but was frustrated with how she felt when she was taking the narcotics.  There has been no recent trauma or injury that preceded the onset of her worsening pain.   Weakness: Denies Symptoms of imbalance: Denies Paresthesias and numbness: Denies Bowel or bladder incontinence: Denies Saddle anesthesia: Denies  Treatments tried: Tylenol , gabapentin , pain management  Review of systems: Denies fevers and chills, night sweats, unexplained weight loss, history of cancer, pain that wakes her at night  Past medical history: Malignant hyperthermia HLD CAD  Allergies: ACEi, aspartame, lipitor, biaxin, roflumilast, erythromycin , levofloxacin   Past  surgical history:  Appendectomy Cholecystectomy Bladder repair Hemorrhoid surgery Cataract surgery Coronary stent placement Kyphoplasty Hernia repair Nissen fundoplication   Social history: Denies use of nicotine product (smoking, vaping, patches, smokeless) Alcohol use: denies Denies recreational drug use   Physical Exam:  BMI of 25.5  General: no acute distress, appears stated age Neurologic: alert, answering questions appropriately, following commands Respiratory: unlabored breathing on room air, symmetric chest rise Psychiatric: appropriate affect, normal cadence to speech   MSK (spine):  -Strength exam      Left  Right EHL    5/5  5/5 TA    5/5  5/5 GSC    5/5  5/5 Knee extension  5/5  5/5 Hip flexion   5/5  5/5  -Sensory exam    Sensation intact to light touch in L3-S1 nerve distributions of bilateral lower extremities  -Achilles DTR: 1/4 on the left, 1/4 on the right -Patellar tendon DTR: 1/4 on the left, 1/4 on the right  -Straight leg raise: negative bilaterally -Clonus: no beats bilaterally  -Left hip exam: No pain through range of motion, negative Stinchfield, negative FABER -Right hip exam: No pain to range of motion, negative Stinchfield, negative FABER  Imaging: XRs of the lumbar spine from 03/04/2024 were independently reviewed and interpreted, showing multiple levels with cement augmentation in the vertebra.  There is posterior instrumentation from L2-L4.  There appears to be a subtle lucency around the L2 screws.  No other lucency seen.  No the screws are backed out.  There is focal kyphosis above her fusion.  MRI of the lumbar spine from 01/17/2023 was independently reviewed and interpreted, showing multiple levels with cement augmentation of the vertebral  body.  There is posterior instrumentation from L2-L4.  Foraminal stenosis seen bilaterally at L5/S1.  DDD at L5/S1.  Grade 1 spondylolisthesis at L4/5.  Bilateral facet arthropathy at L4/5.   Laminectomy defect at L5/S1.  Suspected right-sided hemilaminotomy defect at L4/5   Patient name: [name] [name] Patient MRN: [id number] Date of visit: 03/04/24

## 2024-03-06 ENCOUNTER — Telehealth: Payer: Self-pay | Admitting: Physician Assistant

## 2024-03-06 NOTE — Telephone Encounter (Signed)
 Patient called and was returning your call. CB#[phone]

## 2024-03-14 ENCOUNTER — Encounter: Payer: Self-pay | Admitting: Physician Assistant

## 2024-03-14 ENCOUNTER — Other Ambulatory Visit: Payer: Self-pay | Admitting: Family Medicine

## 2024-03-14 ENCOUNTER — Ambulatory Visit: Admitting: Physician Assistant

## 2024-03-14 ENCOUNTER — Other Ambulatory Visit: Payer: Self-pay

## 2024-03-14 VITALS — Ht 62.0 in | Wt 147.0 lb

## 2024-03-14 DIAGNOSIS — M8000XA Age-related osteoporosis with current pathological fracture, unspecified site, initial encounter for fracture: Secondary | ICD-10-CM

## 2024-03-14 DIAGNOSIS — F411 Generalized anxiety disorder: Secondary | ICD-10-CM

## 2024-03-14 DIAGNOSIS — M545 Low back pain, unspecified: Secondary | ICD-10-CM

## 2024-03-14 MED ORDER — BUSPIRONE HCL 10 MG PO TABS: ORAL_TABLET | ORAL | 0 refills | Status: AC

## 2024-03-14 NOTE — Progress Notes (Signed)
 Office Visit Note   Patient: [name] [name]           Date of Birth: 1935-05-30           MRN: [id number] Visit Date: 03/14/2024              Requested by: Georgina Ozell LABOR, MD [address] [place],  [place] [address] PCP: Alvia Corean LITTIE, FNP   Assessment & Plan: Visit Diagnoses:  1. Age-related osteoporosis with current pathological fracture, initial encounter     Plan: Patient is a pleasant 88 year old woman who is referred for evaluation for osteoporosis treatment.  She is currently not taking any osteoporosis medication.  She did take Prolia  in the past following her compression fractures in her spine.  Unfortunately at that time her and her husband were unable to afford the co-pay.  So she just stopped taking it.  She said she tolerated it well.  She has had multiple fractures including multiple compression fractures in her spine roof requiring kyphoplasty.  She also had a hip fracture earlier this year.  She has a history of heart disease.  No history of cancer or kidney disease ulcers gastric bypass does not have reflux she does not have a history of epilepsy she did undergo hysterectomy when she was 34 with menopause did not have any replacement.  She thinks she takes about 500 mg of calcium  a day and vitamin D  2000 international units she has never been a smoker she does not drink she does walking for exercise.  I spent 45 minutes discussing her options at this point.  I think it is very clear based on her history that she has significant osteoporosis.  She has not had a bone density scan in 2 years we will get a bone density scan however this should not stop going forward with authorization.  I am hopeful that she could be authorized for Jubbonti if that would be more acceptable financially.  If for some reason she could not would look into just having her try Fosamax  but again given her high risk with osteoporosis the Jubbonti would be definitely more appropriate.  Follow-Up  Instructions: Will contact patient regarding coverage  Orders:  No orders of the defined types were placed in this encounter.  No orders of the defined types were placed in this encounter.     Procedures: No procedures performed   Clinical Data: No additional findings.   Subjective: No chief complaint on file.   HPI pleasant 88 year old woman comes in today requesting evaluation by Dr. Georgina for osteoporosis  Review of Systems  All other systems reviewed and are negative.    Objective: Vital Signs: Ht 5' 2 (1.575 m)   Wt 147 lb (66.7 kg)   BMI 26.89 kg/m   Physical Exam Constitutional:      Appearance: Normal appearance.  Pulmonary:     Effort: Pulmonary effort is normal.     Breath sounds: Normal breath sounds.  Skin:    General: Skin is warm and dry.  Neurological:     General: No focal deficit present.     Mental Status: She is alert and oriented to person, place, and time.  Psychiatric:        Mood and Affect: Mood normal.        Behavior: Behavior normal.       Specialty Comments:  CLINICAL DATA:  Low back pain for 6 years   EXAM: MRI LUMBAR SPINE WITHOUT CONTRAST  TECHNIQUE: Multiplanar, multisequence MR imaging of the lumbar spine was performed. No intravenous contrast was administered.   COMPARISON:  11/06/2017   FINDINGS: Segmentation:  Standard.   Alignment: Grade 1 anterolisthesis of L4 on L5. 2 mm retrolisthesis of L5 on S1. Loss of the normal lumbar lordosis with relative kyphosis centered at T12.   Vertebrae: No acute fracture, evidence of discitis, or aggressive bone lesion. Chronic T9, T11, T12, L1, L2, L3 and L4 compression fractures with prior augmentation of the T9, T11, T12, L2, L3 and L4 vertebral bodies.   Conus medullaris and cauda equina: Conus extends to the L1 level. Conus and cauda equina appear normal.   Paraspinal and other soft tissues: No acute paraspinal abnormality.   Other: Mild osteoarthritis of  bilateral SI joints.   Disc levels:   Disc spaces: Degenerative disease with mild disc height loss T11-12, T12-L1, L1-2 and L5-S1. posterior lumbar interbody fusion at L1 through L3.   T9-10: Bilateral facet arthropathy and bilateral mild foraminal stenosis.   T10-11: Moderate bilateral facet arthropathy. Moderate bilateral foraminal stenosis.   T12-L1: No significant disc bulge. Mild bilateral facet arthropathy. No foraminal or central canal stenosis.   L1-L2: Mild broad-based disc bulge. Mild bilateral foraminal stenosis. No spinal stenosis.   L2-L3: Mild broad-based disc bulge. Mild spinal stenosis. Mild bilateral foraminal stenosis.   L3-L4: Minimal broad-based disc bulge. Moderate bilateral facet arthropathy with ligamentum flavum infolding. Moderate spinal stenosis. Moderate bilateral foraminal stenosis.   L4-L5: Broad-based disc bulge. Severe bilateral facet arthropathy. Mild spinal stenosis.   L5-S1: Broad-based disc bulge with a right subarticular disc protrusion and mass effect on the right intraspinal S1 nerve root. Mild bilateral facet arthropathy. Moderate bilateral foraminal stenosis. No spinal stenosis.   IMPRESSION: 1. Lumbar spine spondylosis as described above. 2. No acute osseous injury of the lumbar spine. 3. Chronic T9, T11, T12, L1, L2, L3 and L4 compression fractures with prior augmentation of the T9, T11, T12, L2, L3 and L4 vertebral bodies.     Electronically Signed   By: Julaine Blanch M.D.   On: 02/02/2023 10:03  Imaging: No results found.   PMFS History: Patient Active Problem List   Diagnosis Date Noted   Lower respiratory infection 02/20/2024   Flu-like symptoms 12/18/2023   Hypotension 12/18/2023   Medication management 10/19/2023   CKD stage 3a, GFR 45-59 ml/min (HCC) 10/08/2023   Overweight with body mass index (BMI) of 25 to 25.9 in adult 10/08/2023   Community acquired pneumonia 09/29/2023   Malnutrition of moderate degree  06/02/2023   Leukocytosis 05/31/2023   Closed right hip fracture (HCC) 05/29/2023   Abnormal CT scan, colon 09/02/2022   LLQ pain 09/02/2022   Nausea and vomiting 09/02/2022   Generalized abdominal pain 07/27/2022   Nausea without vomiting 07/27/2022   Poor appetite 07/27/2022   Iron deficiency 10/16/2020   History of adenomatous polyp of colon 07/21/2020   History of fundoplication    Senile purpura 07/05/2019   Abnormal glucose (prediabetes) 03/01/2018   FHx: heart disease 03/01/2018   CKD (chronic kidney disease) stage 2, GFR 60-89 ml/min 09/27/2017   DOE (dyspnea on exertion) 09/08/2017   Chronic bronchitis (HCC) 08/09/2017   Atherosclerosis of aorta (HCC) by Lumbar CYT scan 01/29/2021 04/05/2017   Odynophagia    Spinal stenosis, lumbar region, with neurogenic claudication 05/29/2015   DDD (degenerative disc disease), lumbar 04/21/2015   Hyperlipidemia, mixed 11/24/2014   Gastroesophageal reflux disease 11/24/2014   Depression, major, in partial remission 09/26/2014  Chronic pain syndrome 09/26/2014   Coronary artery disease involving native heart without angina pectoris    Osteoporosis 02/10/2014   Vitamin D  deficiency 08/14/2013   MGUS (monoclonal gammopathy of unknown significance) 03/05/2013   ESOPHAGEAL STRICTURE 08/28/2008   Mild intermittent asthma without complication 07/29/2008   Incarcerated recurrent paraesophageal hiatal hernia s/p lap redo repair [id number] 01/31/2008   Essential hypertension 07/25/2007   Past Medical History:  Diagnosis Date   Anxiety    takes Xanax  daily as needed   Asthma    Albuterol  daily as needed   Blood transfusion without reported diagnosis    CAD (coronary artery disease)    LHC 2003 with 40% pLAD, 30% mLAD, 100% D1 (moderate vessel), 100%  D2 (small vessel), 95% mid small OM2.  Pt had PTCA to D1;  D2 and OM2 small vessels and tx medically;  Last Myoview  (2012): anterior infarct seen with scar, no ischemia. EF normal. Med Rx //  Myoview  (12/14):  Low risk; ant scar w/ peri-infarct ischemia; EF 55% with ant AK // Myoview  5/16: EF 55-65, ant, apical scar, no ischemia    Cataract    Chronic back pain    HNP, DDD, spinal stenosis, vertebral compression fractures.    Chronic nausea    takes Zofran  daily as needed.  normal gastric emptying study 03/2013   CKD (chronic kidney disease), stage III (HCC) 09/27/2017   Constipation    felt to be functional. takes Senokot and Miralax  daily.  treated with Linzess  in past.    DIVERTICULOSIS, COLON 08/28/2008   Qualifier: Diagnosis of  By: Vivian, [name], [name]     Elevated LFTs 2015   probably med induced DILI, from Augmentin  vs Pravastatin    GERD (gastroesophageal reflux disease)    hx of esophageal stricture    Hiatal hernia    Paraesophageal hernias. s/p fundoplications in 2013 and 04/2013   History of bronchitis several of yrs ago   History of colon polyps 03/2009, 03/2011   adenomatous colon polyps, no high grade dysplasia.    History of vertigo    no meds   Hyperlipidemia    takes Pravastatin  daily   Hypertension    takes Amlodipine ,Metoprolol ,and Losartan  daily   Multiple closed pelvic fractures without disruption of pelvic circle (HCC)    Nausea 03/2016   Osteoporosis    PONV (postoperative nausea and vomiting)    yrs ago   Pubic ramus fracture (HCC) 12/20/2019   Slow urinary stream    takes Rapaflo daily   TIA (transient ischemic attack) 1995   no residual effects noted   Vertebral compression fracture (HCC) 06/23/2012    Family History  Problem Relation Age of Onset   Colon cancer Mother 28   Heart attack Father 45       heart attack   Brain cancer Brother        tumor    Heart disease Brother    Ulcers Brother    Arthritis Brother    Kidney disease Daughter    Esophageal cancer Neg Hx    Stomach cancer Neg Hx     Past Surgical History:  Procedure Laterality Date   APPENDECTOMY     patient unsure of date   BACK SURGERY     BLADDER REPAIR      CHOLECYSTECTOMY     Patient unsure of date.   CORONARY ANGIOPLASTY  11/16/2001   1 stent   ESOPHAGEAL MANOMETRY N/A 03/25/2013   Procedure: ESOPHAGEAL MANOMETRY (EM);  Surgeon: Lamar BIRCH  Debrah, MD;  Location: [facility];  Service: Endoscopy;  Laterality: N/A;   ESOPHAGOGASTRODUODENOSCOPY N/A 03/15/2013   Procedure: ESOPHAGOGASTRODUODENOSCOPY (EGD);  Surgeon: [name], MD;  Location: [facility] ENDOSCOPY;  Service: Endoscopy;  Laterality: N/A;   ESOPHAGOGASTRODUODENOSCOPY N/A 12/25/2015   Procedure: ESOPHAGOGASTRODUODENOSCOPY (EGD);  Surgeon: [name], MD;  Location: [facility] END[facility]COPY;  Service: Endoscopy;  Laterality: N/A;   EYE SURGERY  11/2000   bilateral cataracts with lens implant   FIXATION KYPHOPLASTY LUMBAR SPINE  X 2   L3-4   HEMORRHOID SURGERY     HIATAL HERNIA REPAIR  06/03/2011   Procedure: LAPAROSCOPIC REPAIR OF HIATAL HERNIA;  Surgeon: [name], MD;  Location: [facility];  Service: General;  Laterality: N/A;   HIATAL HERNIA REPAIR N/A 04/24/2013   Procedure: LAPAROSCOPIC  REPAIR RECURRENT PARASOPHAGEAL HIATAL HERNIA WITH FUNDOPLICATION;  Surgeon: Elspeth KYM Schultze, MD;  Location: [facility];  Service: General;  Laterality: N/A;   INSERTION OF MESH N/A 04/24/2013   Procedure: INSERTION OF MESH;  Surgeon: Elspeth KYM Schultze, MD;  Location: [facility];  Service: General;  Laterality: N/A;   INTRAMEDULLARY (IM) NAIL INTERTROCHANTERIC Right 05/30/2023   Procedure: INTRAMEDULLARY (IM) NAIL INTERTROCHANTERIC;  Surgeon: Celena Sharper, MD;  Location: [facility];  Service: Orthopedics;  Laterality: Right;   IR KYPHO THORACIC WITH BONE BIOPSY  09/29/2017   IR KYPHO THORACIC WITH BONE BIOPSY  11/03/2017   IR KYPHO THORACIC WITH BONE BIOPSY  02/14/2019   IR KYPHO THORACIC WITH BONE BIOPSY  04/02/2019   LAPAROSCOPIC LYSIS OF ADHESIONS N/A 04/24/2013   Procedure: LAPAROSCOPIC LYSIS OF ADHESIONS;  Surgeon: Elspeth KYM Schultze, MD;  Location: [facility];  Service: General;  Laterality: N/A;   LAPAROSCOPIC NISSEN  FUNDOPLICATION  06/03/2011   Procedure: LAPAROSCOPIC NISSEN FUNDOPLICATION;  Surgeon: [name], MD;  Location: [facility];  Service: General;  Laterality: N/A;   LUMBAR FUSION  12/07/2015   Right L2-3 facetectomy, posterolateral fusion L2-3 with pedicle screws, rods, local bone graft, Vivigen cancellous chips. Fusion extended to the L1 level with pedicle screws and rods   LUMBAR LAMINECTOMY/DECOMPRESSION MICRODISCECTOMY Right 06/26/2012   Procedure: LUMBAR LAMINECTOMY/DECOMPRESSION MICRODISCECTOMY;  Surgeon: Lynwood FORBES Better, MD;  Location[facility] OR;  Service: Orthopedics;  Laterality: Right;  RIGHT L4-5 MICRODISCECTOMY   LUMBAR LAMINECTOMY/DECOMPRESSION MICRODISCECTOMY N/A 05/29/2015   Procedure: RIGHT L2-3 MICRODISCECTOMY;  Surgeon: Lynwood FORBES Better, MD;  Location: [facility] OR;  Service: Orthopedics;  Laterality: N/A;   TOTAL ABDOMINAL HYSTERECTOMY  1975   partial   UPPER GASTROINTESTINAL ENDOSCOPY     Social History   Occupational History   Occupation: Retired    Associate Professor: RETIRED    Comment: worked at [employer]  Tobacco Use   Smoking status: Never    Passive exposure: Never   Smokeless tobacco: Never  Vaping Use   Vaping status: Never Used  Substance and Sexual Activity   Alcohol use: No   Drug use: Never   Sexual activity: Not Currently    Partners: Male    Birth control/protection: Post-menopausal, Surgical

## 2024-03-25 ENCOUNTER — Other Ambulatory Visit: Payer: Self-pay | Admitting: Radiology

## 2024-03-25 DIAGNOSIS — M8000XA Age-related osteoporosis with current pathological fracture, unspecified site, initial encounter for fracture: Secondary | ICD-10-CM

## 2024-03-25 MED ORDER — DENOSUMAB 60 MG/ML ~~LOC~~ SOSY: 60.0000 mg | PREFILLED_SYRINGE | Freq: Once | SUBCUTANEOUS | Status: AC

## 2024-03-27 ENCOUNTER — Other Ambulatory Visit: Payer: Self-pay

## 2024-03-27 ENCOUNTER — Ambulatory Visit: Admitting: Physical Medicine and Rehabilitation

## 2024-03-27 VITALS — BP 124/71 | HR 56

## 2024-03-27 DIAGNOSIS — M47816 Spondylosis without myelopathy or radiculopathy, lumbar region: Secondary | ICD-10-CM | POA: Diagnosis not present

## 2024-03-27 MED ORDER — METHYLPREDNISOLONE ACETATE 40 MG/ML IJ SUSP
40.0000 mg | Freq: Once | INTRAMUSCULAR | Status: AC
  Administered 2024-03-27: 40 mg

## 2024-03-27 NOTE — Progress Notes (Signed)
 Pain Scale   Average Pain 7  Lower back pain does not radiate. Constant pain.       +Driver, -BT, -Dye Allergies.

## 2024-03-27 NOTE — Procedures (Signed)
 Lumbar Facet Joint Intra-Articular Injection(s) with Fluoroscopic Guidance  Patient: [name] [name]      Date of Birth: 19-Jun-1935 MRN: [id number] PCP: Alvia Corean LITTIE, FNP      Visit Date: 03/27/2024   Universal Protocol:    Date/Time: 03/27/2024  Consent Given By: the patient  Position: PRONE   Additional Comments: Vital signs were monitored before and after the procedure. Patient was prepped and draped in the usual sterile fashion. The correct patient, procedure, and site was verified.   Injection Procedure Details:  Procedure Site One Meds Administered:  Meds ordered this encounter  Medications   methylPREDNISolone  acetate (DEPO-MEDROL ) injection 40 mg     Laterality: Bilateral  Location/Site:  L4-L5 L5-S1  Needle size: 22 guage  Needle type: Spinal  Needle Placement: Articular  Findings:  -Comments: Excellent flow of contrast producing a partial arthrogram.  Procedure Details: The fluoroscope beam is vertically oriented in AP, and the inferior recess is visualized beneath the lower pole of the inferior apophyseal process, which represents the target point for needle insertion. When direct visualization is difficult the target point is located at the medial projection of the vertebral pedicle. The region overlying each aforementioned target is locally anesthetized with a 1 to 2 ml. volume of 1% Lidocaine  without Epinephrine .   The spinal needle was inserted into each of the above mentioned facet joints using biplanar fluoroscopic guidance. A 0.25 to 0.5 ml. volume of Isovue -250 was injected and a partial facet joint arthrogram was obtained. A single spot film was obtained of the resulting arthrogram.    One to 1.25 ml of the steroid/anesthetic solution was then injected into each of the facet joints noted above.   Additional Comments:  The patient tolerated the procedure well Dressing: 2 x 2 sterile gauze and Band-Aid    Post-procedure details: Patient  was observed during the procedure. Post-procedure instructions were reviewed.  Patient left the clinic in stable condition.

## 2024-03-27 NOTE — Progress Notes (Signed)
 [name] [name] - 88 y.o. female MRN [id number]  Date of birth: Feb 13, 1936  Office Visit Note: Visit Date: 03/27/2024 PCP: Alvia Corean LITTIE, FNP Referred by: Georgina Ozell LABOR, MD  Subjective: Chief Complaint  Patient presents with   Lower Back - Pain   HPI:  [name] [name] is a 88 y.o. female who comes in today at the request of Dr. Ozell Georgina for planned Bilateral  L4-5 and L5-S1 Lumbar facet/medial branch block with fluoroscopic guidance.  The patient has failed conservative care including home exercise, medications, time and activity modification.  This injection will be diagnostic and hopefully therapeutic.  Please see requesting physician notes for further details and justification.  Exam has shown concordant pain with facet joint loading.   ROS Otherwise per HPI.  Assessment & Plan: Visit Diagnoses:    ICD-10-CM   1. Spondylosis without myelopathy or radiculopathy, lumbar region  M47.816 XR C-ARM NO REPORT    Facet Injection    methylPREDNISolone  acetate (DEPO-MEDROL ) injection 40 mg      Plan: No additional findings.   Meds & Orders:  Meds ordered this encounter  Medications   methylPREDNISolone  acetate (DEPO-MEDROL ) injection 40 mg    Orders Placed This Encounter  Procedures   Facet Injection   XR C-ARM NO REPORT    Follow-up: Return for visit to requesting provider as needed.   Procedures: No procedures performed  Lumbar Facet Joint Intra-Articular Injection(s) with Fluoroscopic Guidance  Patient: [name] [name]      Date of Birth: 1936-03-19 MRN: [id number] PCP: Alvia Corean LITTIE, FNP      Visit Date: 03/27/2024   Universal Protocol:    Date/Time: 03/27/2024  Consent Given By: the patient  Position: PRONE   Additional Comments: Vital signs were monitored before and after the procedure. Patient was prepped and draped in the usual sterile fashion. The correct patient, procedure, and site was verified.   Injection Procedure Details:   Procedure Site One Meds Administered:  Meds ordered this encounter  Medications   methylPREDNISolone  acetate (DEPO-MEDROL ) injection 40 mg     Laterality: Bilateral  Location/Site:  L4-L5 L5-S1  Needle size: 22 guage  Needle type: Spinal  Needle Placement: Articular  Findings:  -Comments: Excellent flow of contrast producing a partial arthrogram.  Procedure Details: The fluoroscope beam is vertically oriented in AP, and the inferior recess is visualized beneath the lower pole of the inferior apophyseal process, which represents the target point for needle insertion. When direct visualization is difficult the target point is located at the medial projection of the vertebral pedicle. The region overlying each aforementioned target is locally anesthetized with a 1 to 2 ml. volume of 1% Lidocaine  without Epinephrine .   The spinal needle was inserted into each of the above mentioned facet joints using biplanar fluoroscopic guidance. A 0.25 to 0.5 ml. volume of Isovue -250 was injected and a partial facet joint arthrogram was obtained. A single spot film was obtained of the resulting arthrogram.    One to 1.25 ml of the steroid/anesthetic solution was then injected into each of the facet joints noted above.   Additional Comments:  The patient tolerated the procedure well Dressing: 2 x 2 sterile gauze and Band-Aid    Post-procedure details: Patient was observed during the procedure. Post-procedure instructions were reviewed.  Patient left the clinic in stable condition.    Clinical History: CLINICAL DATA:  Low back pain for 6 years   EXAM: MRI LUMBAR SPINE WITHOUT CONTRAST   TECHNIQUE:  Multiplanar, multisequence MR imaging of the lumbar spine was performed. No intravenous contrast was administered.   COMPARISON:  11/06/2017   FINDINGS: Segmentation:  Standard.   Alignment: Grade 1 anterolisthesis of L4 on L5. 2 mm retrolisthesis of L5 on S1. Loss of the normal lumbar  lordosis with relative kyphosis centered at T12.   Vertebrae: No acute fracture, evidence of discitis, or aggressive bone lesion. Chronic T9, T11, T12, L1, L2, L3 and L4 compression fractures with prior augmentation of the T9, T11, T12, L2, L3 and L4 vertebral bodies.   Conus medullaris and cauda equina: Conus extends to the L1 level. Conus and cauda equina appear normal.   Paraspinal and other soft tissues: No acute paraspinal abnormality.   Other: Mild osteoarthritis of bilateral SI joints.   Disc levels:   Disc spaces: Degenerative disease with mild disc height loss T11-12, T12-L1, L1-2 and L5-S1. posterior lumbar interbody fusion at L1 through L3.   T9-10: Bilateral facet arthropathy and bilateral mild foraminal stenosis.   T10-11: Moderate bilateral facet arthropathy. Moderate bilateral foraminal stenosis.   T12-L1: No significant disc bulge. Mild bilateral facet arthropathy. No foraminal or central canal stenosis.   L1-L2: Mild broad-based disc bulge. Mild bilateral foraminal stenosis. No spinal stenosis.   L2-L3: Mild broad-based disc bulge. Mild spinal stenosis. Mild bilateral foraminal stenosis.   L3-L4: Minimal broad-based disc bulge. Moderate bilateral facet arthropathy with ligamentum flavum infolding. Moderate spinal stenosis. Moderate bilateral foraminal stenosis.   L4-L5: Broad-based disc bulge. Severe bilateral facet arthropathy. Mild spinal stenosis.   L5-S1: Broad-based disc bulge with a right subarticular disc protrusion and mass effect on the right intraspinal S1 nerve root. Mild bilateral facet arthropathy. Moderate bilateral foraminal stenosis. No spinal stenosis.   IMPRESSION: 1. Lumbar spine spondylosis as described above. 2. No acute osseous injury of the lumbar spine. 3. Chronic T9, T11, T12, L1, L2, L3 and L4 compression fractures with prior augmentation of the T9, T11, T12, L2, L3 and L4 vertebral bodies.     Electronically Signed    By: Julaine Blanch M.D.   On: 02/02/2023 10:03     Objective:  VS:  HT:    WT:   BMI:     BP:124/71  HR:(!) 56bpm  TEMP: ( )  RESP:  Physical Exam Vitals and nursing note reviewed.  Constitutional:      General: She is not in acute distress.    Appearance: Normal appearance. She is not ill-appearing.  HENT:     Head: Normocephalic and atraumatic.     Right Ear: External ear normal.     Left Ear: External ear normal.  Eyes:     Extraocular Movements: Extraocular movements intact.  Cardiovascular:     Rate and Rhythm: Normal rate.     Pulses: Normal pulses.  Pulmonary:     Effort: Pulmonary effort is normal. No respiratory distress.  Abdominal:     General: There is no distension.     Palpations: Abdomen is soft.  Musculoskeletal:        General: Tenderness present.     Cervical back: Neck supple.     Right lower leg: No edema.     Left lower leg: No edema.     Comments: Patient has good distal strength with no pain over the greater trochanters.  No clonus or focal weakness.  Skin:    Findings: No erythema, lesion or rash.  Neurological:     General: No focal deficit present.     Mental  Status: She is alert and oriented to person, place, and time.     Sensory: No sensory deficit.     Motor: No weakness or abnormal muscle tone.     Coordination: Coordination normal.  Psychiatric:        Mood and Affect: Mood normal.        Behavior: Behavior normal.      Imaging: No results found.

## 2024-04-01 ENCOUNTER — Telehealth: Payer: Self-pay | Admitting: Physician Assistant

## 2024-04-01 ENCOUNTER — Telehealth: Payer: Self-pay | Admitting: Physical Medicine and Rehabilitation

## 2024-04-01 DIAGNOSIS — D0462 Carcinoma in situ of skin of left upper limb, including shoulder: Secondary | ICD-10-CM | POA: Diagnosis not present

## 2024-04-01 DIAGNOSIS — D0472 Carcinoma in situ of skin of left lower limb, including hip: Secondary | ICD-10-CM | POA: Diagnosis not present

## 2024-04-01 DIAGNOSIS — D485 Neoplasm of uncertain behavior of skin: Secondary | ICD-10-CM | POA: Diagnosis not present

## 2024-04-01 DIAGNOSIS — D0439 Carcinoma in situ of skin of other parts of face: Secondary | ICD-10-CM | POA: Diagnosis not present

## 2024-04-01 NOTE — Telephone Encounter (Signed)
 Pt called and is confused as to what her appt on Dec 1st is for. I don't see it as scheduled. She says Prolia  shot, but doesn't know anything about it. Call back number is [phone]

## 2024-04-01 NOTE — Telephone Encounter (Signed)
 Pt called saying that she is confused about an appt that she has on Dec 1. She says it's for a Prolia  injection. Call back number is [phone].

## 2024-04-03 ENCOUNTER — Other Ambulatory Visit: Payer: Self-pay | Admitting: Family Medicine

## 2024-04-03 DIAGNOSIS — G894 Chronic pain syndrome: Secondary | ICD-10-CM

## 2024-04-03 DIAGNOSIS — G2581 Restless legs syndrome: Secondary | ICD-10-CM

## 2024-04-12 ENCOUNTER — Telehealth: Payer: Self-pay | Admitting: Physician Assistant

## 2024-04-12 NOTE — Telephone Encounter (Signed)
 Patient says HTA reccomended Jubbonti- HTA told patient that the copay is $250 for Jubbonti. This is the 20% per HTA.

## 2024-04-12 NOTE — Telephone Encounter (Signed)
 I am calling patient, she says [employer] won't cover Prolia  next year 2026.  I am discussing w/ her and husband, will make note on other message.

## 2024-04-12 NOTE — Telephone Encounter (Signed)
 Patient called and needs for Ronal dragon to call her about prolia  shot. Her insurance will not pay for it. CB#[phone]

## 2024-04-12 NOTE — Telephone Encounter (Signed)
 Patient called and returning your call. She said her insurance will not pay for the shot and she would have to pay three hundred and something dollars and she doesn't have it. Her insurance approved something called Jobb. CB#[phone]

## 2024-04-15 ENCOUNTER — Ambulatory Visit: Admitting: Orthopedic Surgery

## 2024-04-15 ENCOUNTER — Ambulatory Visit: Admitting: Physician Assistant

## 2024-04-15 DIAGNOSIS — M545 Low back pain, unspecified: Secondary | ICD-10-CM | POA: Diagnosis not present

## 2024-04-15 DIAGNOSIS — M8000XA Age-related osteoporosis with current pathological fracture, unspecified site, initial encounter for fracture: Secondary | ICD-10-CM

## 2024-04-15 MED ORDER — DENOSUMAB-BBDZ 60 MG/ML ~~LOC~~ SOSY
60.0000 mg | PREFILLED_SYRINGE | Freq: Once | SUBCUTANEOUS | Status: AC
  Administered 2024-04-15: 60 mg via SUBCUTANEOUS

## 2024-04-15 NOTE — Progress Notes (Signed)
 Office Visit Note   Patient: [name] [name]           Date of Birth: 04/05/1936           MRN: [id number] Visit Date: 04/15/2024              Requested by: Alvia Corean LITTIE, FNP [address] [place],  [place] [address] PCP: Alvia Corean LITTIE, FNP  Chief Complaint  Patient presents with   JUBBONTI  INJECTION      HPI: Patient comes in for her first Jubbonti  injection  Assessment & Plan: Visit Diagnoses:  1. Age-related osteoporosis with current pathological fracture, initial encounter     Plan: Injection given without difficulty once again patient was observed for a long her first initial injection she did fine  Follow-Up Instructions: No follow-ups on file.   Ortho Exam  Patient is alert, oriented, no adenopathy, well-dressed, normal affect, normal respiratory effort.     Imaging: No results found. No images are attached to the encounter.  Labs: Lab Results  Component Value Date   HGBA1C 6.0 (H) 10/31/2022   HGBA1C 6.1 (H) 07/22/2022   HGBA1C 6.2 (H) 05/23/2022   ESRSEDRATE 16 08/12/2022   ESRSEDRATE 9 10/26/2017   ESRSEDRATE 1 02/27/2015   CRP <1.0 08/12/2022   LABURIC 6.3 12/26/2019   REPTSTATUS 10/06/2023 FINAL 10/01/2023   CULT  10/01/2023    NO GROWTH 5 DAYS Performed at [facility], [address], [place], [place] [address]    IDOLINA CITROBACTER FREUNDII (A) 04/13/2018     Lab Results  Component Value Date   ALBUMIN  4.2 02/28/2024   ALBUMIN  4.3 12/19/2023   ALBUMIN  3.9 10/19/2023    Lab Results  Component Value Date   MG 1.7 10/31/2022   MG 1.9 05/23/2022   MG 1.9 03/10/2022   Lab Results  Component Value Date   VD25OH 48.87 10/19/2023   VD25OH 63.89 05/31/2023   VD25OH 77 10/31/2022    No results found for: PREALBUMIN    Latest Ref Rng & Units 12/19/2023   11:50 AM 10/19/2023   12:10 PM 10/06/2023   11:29 AM  CBC EXTENDED  WBC 4.0 - 10.5 K/uL 8.8  7.4  11.1   RBC 3.87 - 5.11 Mil/uL 4.59   4.64  4.84   Hemoglobin 12.0 - 15.0 g/dL 85.8  86.2  85.8   HCT 36.0 - 46.0 % 43.8  41.7  43.1   Platelets 150.0 - 400.0 K/uL 349.0  379.0  341.0   NEUT# 1.4 - 7.7 K/uL 5.9  5.0  6.8   Lymph# 0.7 - 4.0 K/uL 1.9  1.5  3.3      There is no height or weight on file to calculate BMI.  Orders:  No orders of the defined types were placed in this encounter.  Meds ordered this encounter  Medications   denosumab -bbdz (JUBBONTI ) injection 60 mg    Patient is enrolled in [employer] program for this medication and I have provided a copy of the denosumab  Medication Guide and Patient Brochure.:   Yes    I have reviewed with the patient the information in the denosumab  Medication Guide and Patient Counseling Chart including the serious risks and symptoms of each risk.:   Yes    I have advised the patient to seek medical attention if they have signs or symptoms of any of the serious risks.:   Yes     Procedures: No procedures performed  Clinical Data:  No additional findings.  ROS:  All other systems negative, except as noted in the HPI. Review of Systems  Objective: Vital Signs: There were no vitals taken for this visit.  Specialty Comments:  CLINICAL DATA:  Low back pain for 6 years   EXAM: MRI LUMBAR SPINE WITHOUT CONTRAST   TECHNIQUE: Multiplanar, multisequence MR imaging of the lumbar spine was performed. No intravenous contrast was administered.   COMPARISON:  11/06/2017   FINDINGS: Segmentation:  Standard.   Alignment: Grade 1 anterolisthesis of L4 on L5. 2 mm retrolisthesis of L5 on S1. Loss of the normal lumbar lordosis with relative kyphosis centered at T12.   Vertebrae: No acute fracture, evidence of discitis, or aggressive bone lesion. Chronic T9, T11, T12, L1, L2, L3 and L4 compression fractures with prior augmentation of the T9, T11, T12, L2, L3 and L4 vertebral bodies.   Conus medullaris and cauda equina: Conus extends to the L1 level. Conus and cauda equina  appear normal.   Paraspinal and other soft tissues: No acute paraspinal abnormality.   Other: Mild osteoarthritis of bilateral SI joints.   Disc levels:   Disc spaces: Degenerative disease with mild disc height loss T11-12, T12-L1, L1-2 and L5-S1. posterior lumbar interbody fusion at L1 through L3.   T9-10: Bilateral facet arthropathy and bilateral mild foraminal stenosis.   T10-11: Moderate bilateral facet arthropathy. Moderate bilateral foraminal stenosis.   T12-L1: No significant disc bulge. Mild bilateral facet arthropathy. No foraminal or central canal stenosis.   L1-L2: Mild broad-based disc bulge. Mild bilateral foraminal stenosis. No spinal stenosis.   L2-L3: Mild broad-based disc bulge. Mild spinal stenosis. Mild bilateral foraminal stenosis.   L3-L4: Minimal broad-based disc bulge. Moderate bilateral facet arthropathy with ligamentum flavum infolding. Moderate spinal stenosis. Moderate bilateral foraminal stenosis.   L4-L5: Broad-based disc bulge. Severe bilateral facet arthropathy. Mild spinal stenosis.   L5-S1: Broad-based disc bulge with a right subarticular disc protrusion and mass effect on the right intraspinal S1 nerve root. Mild bilateral facet arthropathy. Moderate bilateral foraminal stenosis. No spinal stenosis.   IMPRESSION: 1. Lumbar spine spondylosis as described above. 2. No acute osseous injury of the lumbar spine. 3. Chronic T9, T11, T12, L1, L2, L3 and L4 compression fractures with prior augmentation of the T9, T11, T12, L2, L3 and L4 vertebral bodies.     Electronically Signed   By: Julaine Blanch M.D.   On: 02/02/2023 10:03  PMFS History: Patient Active Problem List   Diagnosis Date Noted   Lower respiratory infection 02/20/2024   Flu-like symptoms 12/18/2023   Hypotension 12/18/2023   Medication management 10/19/2023   CKD stage 3a, GFR 45-59 ml/min (HCC) 10/08/2023   Overweight with body mass index (BMI) of 25 to 25.9 in adult  10/08/2023   Community acquired pneumonia 09/29/2023   Malnutrition of moderate degree 06/02/2023   Leukocytosis 05/31/2023   Closed right hip fracture (HCC) 05/29/2023   Abnormal CT scan, colon 09/02/2022   LLQ pain 09/02/2022   Nausea and vomiting 09/02/2022   Generalized abdominal pain 07/27/2022   Nausea without vomiting 07/27/2022   Poor appetite 07/27/2022   Iron deficiency 10/16/2020   History of adenomatous polyp of colon 07/21/2020   History of fundoplication    Senile purpura 07/05/2019   Abnormal glucose (prediabetes) 03/01/2018   FHx: heart disease 03/01/2018   CKD (chronic kidney disease) stage 2, GFR 60-89 ml/min 09/27/2017   DOE (dyspnea on exertion) 09/08/2017   Chronic bronchitis (HCC) 08/09/2017   Atherosclerosis of aorta (HCC)  by Lumbar CYT scan 01/29/2021 04/05/2017   Odynophagia    Spinal stenosis, lumbar region, with neurogenic claudication 05/29/2015   DDD (degenerative disc disease), lumbar 04/21/2015   Hyperlipidemia, mixed 11/24/2014   Gastroesophageal reflux disease 11/24/2014   Depression, major, in partial remission 09/26/2014   Chronic pain syndrome 09/26/2014   Coronary artery disease involving native heart without angina pectoris    Age-related osteoporosis without current pathological fracture 02/10/2014   Vitamin D  deficiency 08/14/2013   MGUS (monoclonal gammopathy of unknown significance) 03/05/2013   ESOPHAGEAL STRICTURE 08/28/2008   Mild intermittent asthma without complication 07/29/2008   Incarcerated recurrent paraesophageal hiatal hernia s/p lap redo repair [id number] 01/31/2008   Essential hypertension 07/25/2007   Past Medical History:  Diagnosis Date   Anxiety    takes Xanax  daily as needed   Asthma    Albuterol  daily as needed   Blood transfusion without reported diagnosis    CAD (coronary artery disease)    LHC 2003 with 40% pLAD, 30% mLAD, 100% D1 (moderate vessel), 100%  D2 (small vessel), 95% mid small OM2.  Pt had PTCA to  D1;  D2 and OM2 small vessels and tx medically;  Last Myoview  (2012): anterior infarct seen with scar, no ischemia. EF normal. Med Rx // Myoview  (12/14):  Low risk; ant scar w/ peri-infarct ischemia; EF 55% with ant AK // Myoview  5/16: EF 55-65, ant, apical scar, no ischemia    Cataract    Chronic back pain    HNP, DDD, spinal stenosis, vertebral compression fractures.    Chronic nausea    takes Zofran  daily as needed.  normal gastric emptying study 03/2013   CKD (chronic kidney disease), stage III (HCC) 09/27/2017   Constipation    felt to be functional. takes Senokot and Miralax  daily.  treated with Linzess  in past.    DIVERTICULOSIS, COLON 08/28/2008   Qualifier: Diagnosis of  By: Vivian, [name], [name]     Elevated LFTs 2015   probably med induced DILI, from Augmentin  vs Pravastatin    GERD (gastroesophageal reflux disease)    hx of esophageal stricture    Hiatal hernia    Paraesophageal hernias. s/p fundoplications in 2013 and 04/2013   History of bronchitis several of yrs ago   History of colon polyps 03/2009, 03/2011   adenomatous colon polyps, no high grade dysplasia.    History of vertigo    no meds   Hyperlipidemia    takes Pravastatin  daily   Hypertension    takes Amlodipine ,Metoprolol ,and Losartan  daily   Multiple closed pelvic fractures without disruption of pelvic circle (HCC)    Nausea 03/2016   Osteoporosis    PONV (postoperative nausea and vomiting)    yrs ago   Pubic ramus fracture (HCC) 12/20/2019   Slow urinary stream    takes Rapaflo daily   TIA (transient ischemic attack) 1995   no residual effects noted   Vertebral compression fracture (HCC) 06/23/2012    Family History  Problem Relation Age of Onset   Colon cancer Mother 75   Heart attack Father 70       heart attack   Brain cancer Brother        tumor    Heart disease Brother    Ulcers Brother    Arthritis Brother    Kidney disease Daughter    Esophageal cancer Neg Hx    Stomach cancer Neg Hx      Past Surgical History:  Procedure Laterality Date   APPENDECTOMY  patient unsure of date   BACK SURGERY     BLADDER REPAIR     CHOLECYSTECTOMY     Patient unsure of date.   CORONARY ANGIOPLASTY  11/16/2001   1 stent   ESOPHAGEAL MANOMETRY N/A 03/25/2013   Procedure: ESOPHAGEAL MANOMETRY (EM);  Surgeon: [name], MD;  Location: [facility];  Service: Endoscopy;  Laterality: N/A;   ESOPHAGOGASTRODUODENOSCOPY N/A 03/15/2013   Procedure: ESOPHAGOGASTRODUODENOSCOPY (EGD);  Surgeon: [name], MD;  Location[name] ENDOSCOPY;  Service: Endoscopy;  Laterality: N/A;   ESOPHAGOGASTRODUODENOSCOPY N/A 12/25/2015   Procedure: ESOPHAGOGASTRODUODENOSCOPY (EGD);  Surgeon: [name], MD;  Location[facility] END[facility]COPY;  Service: Endoscopy;  Laterality: N/A;   EYE SURGERY  11/2000   bilateral cataracts with lens implant   FIXATION KYPHOPLASTY LUMBAR SPINE  X 2   L3-4   HEMORRHOID SURGERY     HIATAL HERNIA REPAIR  06/03/2011   Procedure: LAPAROSCOPIC REPAIR OF HIATAL HERNIA;  Surgeon: [name], MD;  Location: [facility];  Service: General;  Laterality: N/A;   HIATAL HERNIA REPAIR N/A 04/24/2013   Procedure: LAPAROSCOPIC  REPAIR RECURRENT PARASOPHAGEAL HIATAL HERNIA WITH FUNDOPLICATION;  Surgeon: Elspeth KYM Schultze, MD;  Location: [facility];  Service: General;  Laterality: N/A;   INSERTION OF MESH N/A 04/24/2013   Procedure: INSERTION OF MESH;  Surgeon: Elspeth KYM Schultze, MD;  Location: [facility];  Service: General;  Laterality: N/A;   INTRAMEDULLARY (IM) NAIL INTERTROCHANTERIC Right 05/30/2023   Procedure: INTRAMEDULLARY (IM) NAIL INTERTROCHANTERIC;  Surgeon: Celena Sharper, MD;  Location: [facility] OR;  Service: Orthopedics;  Laterality: Right;   IR KYPHO THORACIC WITH BONE BIOPSY  09/29/2017   IR KYPHO THORACIC WITH BONE BIOPSY  11/03/2017   IR KYPHO THORACIC WITH BONE BIOPSY  02/14/2019   IR KYPHO THORACIC WITH BONE BIOPSY  04/02/2019   LAPAROSCOPIC LYSIS OF ADHESIONS N/A 04/24/2013   Procedure:  LAPAROSCOPIC LYSIS OF ADHESIONS;  Surgeon: Elspeth KYM Schultze, MD;  Location: [facility];  Service: General;  Laterality: N/A;   LAPAROSCOPIC NISSEN FUNDOPLICATION  06/03/2011   Procedure: LAPAROSCOPIC NISSEN FUNDOPLICATION;  Surgeon: [name], MD;  Location: [facility];  Service: General;  Laterality: N/A;   LUMBAR FUSION  12/07/2015   Right L2-3 facetectomy, posterolateral fusion L2-3 with pedicle screws, rods, local bone graft, Vivigen cancellous chips. Fusion extended to the L1 level with pedicle screws and rods   LUMBAR LAMINECTOMY/DECOMPRESSION MICRODISCECTOMY Right 06/26/2012   Procedure: LUMBAR LAMINECTOMY/DECOMPRESSION MICRODISCECTOMY;  Surgeon: Lynwood FORBES Better, MD;  Location[facility] OR;  Service: Orthopedics;  Laterality: Right;  RIGHT L4-5 MICRODISCECTOMY   LUMBAR LAMINECTOMY/DECOMPRESSION MICRODISCECTOMY N/A 05/29/2015   Procedure: RIGHT L2-3 MICRODISCECTOMY;  Surgeon: Lynwood FORBES Better, MD;  Location: [facility] OR;  Service: Orthopedics;  Laterality: N/A;   TOTAL ABDOMINAL HYSTERECTOMY  1975   partial   UPPER GASTROINTESTINAL ENDOSCOPY     Social History   Occupational History   Occupation: Retired    Associate Professor: RETIRED    Comment: worked at [employer]  Tobacco Use   Smoking status: Never    Passive exposure: Never   Smokeless tobacco: Never  Vaping Use   Vaping status: Never Used  Substance and Sexual Activity   Alcohol use: No   Drug use: Never   Sexual activity: Not Currently    Partners: Male    Birth control/protection: Post-menopausal, Surgical

## 2024-04-15 NOTE — Progress Notes (Signed)
 Orthopedic Spine Surgery Office Note   Assessment: Patient is a 88 y.o. female with chronic low back pain     Plan: -Patient has tried Tylenol , gabapentin , pain management -Since she did not do well with facet injections, would not repeat in the future -Talked about correction of her deformity as an option again. She is not interested in that. I think pain management would be a good option in this scenario since she has not noticed improvement with time and multiple conservative treatments -Patient should return to office on an as needed basis     Patient expressed understanding of the plan and all questions were answered to the patient's satisfaction.    ___________________________________________________________________________     History:   Patient is a 87 y.o. female who presents today for follow up on her lumbar spine.  Patient continues to have low back pain.  She got facet injections after our last visit.  She said she got 25 to 30% relief with those injections. She still has pain that radiates into her buttocks and hips but does not radiate past that point. No change in her symptoms since she was last seen.      Treatments tried: Tylenol , gabapentin , pain management, facet injections     Physical Exam:   BMI of 25.5   General: no acute distress, appears stated age Neurologic: alert, answering questions appropriately, following commands Respiratory: unlabored breathing on room air, symmetric chest rise Psychiatric: appropriate affect, normal cadence to speech     MSK (spine):   -Strength exam                                                   Left                  Right EHL                              5/5                  5/5 TA                                 5/5                  5/5 GSC                             5/5                  5/5 Knee extension            5/5                  5/5 Hip flexion                    5/5                  5/5   -Sensory exam                            Sensation intact to light touch in L3-S1 nerve distributions of bilateral lower extremities  Imaging: XRs of the lumbar spine from 03/04/2024 were previously independently reviewed and interpreted, showing multiple levels with cement augmentation in the vertebra.  There is posterior instrumentation from L2-L4.  There appears to be a subtle lucency around the L2 screws.  No other lucency seen.  No the screws are backed out.  There is focal kyphosis above her fusion.   MRI of the lumbar spine from 01/17/2023 was previously independently reviewed and interpreted, showing multiple levels with cement augmentation of the vertebral body.  There is posterior instrumentation from L2-L4.  Foraminal stenosis seen bilaterally at L5/S1.  DDD at L5/S1.  Grade 1 spondylolisthesis at L4/5.  Bilateral facet arthropathy at L4/5.  Laminectomy defect at L5/S1.  Suspected right-sided hemilaminotomy defect at L4/5     Patient name: [name] [name] Patient MRN: [id number] Date of visit: 04/15/24

## 2024-04-19 ENCOUNTER — Other Ambulatory Visit: Payer: Self-pay | Admitting: Internal Medicine

## 2024-04-23 ENCOUNTER — Telehealth: Payer: Self-pay

## 2024-04-23 ENCOUNTER — Other Ambulatory Visit (HOSPITAL_COMMUNITY): Payer: Self-pay

## 2024-04-23 NOTE — Telephone Encounter (Signed)
*  Pulm  Pharmacy Patient Advocate Encounter   Received notification from Fax that prior authorization for Dulera  is required/requested.   Insurance verification completed.   The patient is insured through [facility] ADVANTAGE/RX ADVANCE.   Per test claim:  Breo Ellipta  is preferred by the insurance.  If suggested medication is appropriate, Please send in a new RX and discontinue this one. If not, please advise as to why it's not appropriate so that we may request a Prior Authorization. Please note, some preferred medications may still require a PA.  If the suggested medications have not been trialed and there are no contraindications to their use, the PA will not be submitted, as it will not be approved.    I see patient was previously prescribed Breo Ellipta , but not how it worked for them.

## 2024-04-23 NOTE — Telephone Encounter (Signed)
 Key: [id number]

## 2024-05-01 LAB — HEPATIC FUNCTION PANEL
ALT: 15 IU/L (ref 0–32)
AST: 21 IU/L (ref 0–40)
Albumin: 4.5 g/dL (ref 3.7–4.7)
Alkaline Phosphatase: 71 IU/L (ref 48–129)
Bilirubin Total: 0.8 mg/dL (ref 0.0–1.2)
Bilirubin, Direct: 0.26 mg/dL (ref 0.00–0.40)
Total Protein: 6.9 g/dL (ref 6.0–8.5)

## 2024-05-01 LAB — LIPID PANEL
Chol/HDL Ratio: 1.9 ratio (ref 0.0–4.4)
Cholesterol, Total: 120 mg/dL (ref 100–199)
HDL: 64 mg/dL
LDL Chol Calc (NIH): 42 mg/dL (ref 0–99)
Triglycerides: 70 mg/dL (ref 0–149)
VLDL Cholesterol Cal: 14 mg/dL (ref 5–40)

## 2024-05-01 NOTE — Progress Notes (Signed)
Patient has been notified directly; all questions, if any, were answered. Patient voiced understanding.   

## 2024-05-06 NOTE — Telephone Encounter (Signed)
 Submitted to plan with trial of Breo Ellipta , pending determination.

## 2024-05-06 NOTE — Telephone Encounter (Signed)
 Your request has been approved 29-DEC-25:29-DEC-26 Dulera  200-5MCG/ACT IN AERO Quantity:13

## 2024-05-17 ENCOUNTER — Encounter: Payer: Self-pay | Admitting: Physical Medicine & Rehabilitation

## 2024-05-20 ENCOUNTER — Ambulatory Visit

## 2024-05-23 ENCOUNTER — Encounter: Admitting: Physical Medicine & Rehabilitation

## 2024-05-24 ENCOUNTER — Ambulatory Visit: Admitting: Internal Medicine

## 2024-05-24 ENCOUNTER — Ambulatory Visit

## 2024-05-24 ENCOUNTER — Telehealth: Payer: Self-pay

## 2024-05-24 ENCOUNTER — Other Ambulatory Visit: Payer: Self-pay | Admitting: Internal Medicine

## 2024-05-24 ENCOUNTER — Ambulatory Visit: Payer: Self-pay | Admitting: *Deleted

## 2024-05-24 ENCOUNTER — Ambulatory Visit: Payer: Self-pay | Admitting: Internal Medicine

## 2024-05-24 ENCOUNTER — Ambulatory Visit
Admission: RE | Admit: 2024-05-24 | Discharge: 2024-05-24 | Disposition: A | Discharge status: Home / Self Care | Source: Ambulatory Visit | Attending: Internal Medicine | Admitting: Internal Medicine

## 2024-05-24 ENCOUNTER — Encounter: Payer: Self-pay | Admitting: Internal Medicine

## 2024-05-24 ENCOUNTER — Ambulatory Visit: Admission: RE | Admit: 2024-05-24 | Source: Ambulatory Visit

## 2024-05-24 VITALS — BP 118/60 | HR 65 | Temp 97.8°F | Ht 62.0 in | Wt 141.0 lb

## 2024-05-24 DIAGNOSIS — R1030 Lower abdominal pain, unspecified: Secondary | ICD-10-CM

## 2024-05-24 DIAGNOSIS — J410 Simple chronic bronchitis: Secondary | ICD-10-CM

## 2024-05-24 DIAGNOSIS — R197 Diarrhea, unspecified: Secondary | ICD-10-CM

## 2024-05-24 DIAGNOSIS — R110 Nausea: Secondary | ICD-10-CM | POA: Diagnosis not present

## 2024-05-24 LAB — CBC WITH DIFFERENTIAL/PLATELET
Basophils Absolute: 0.1 K/uL (ref 0.0–0.1)
Basophils Relative: 0.5 % (ref 0.0–3.0)
Eosinophils Absolute: 0.1 K/uL (ref 0.0–0.7)
Eosinophils Relative: 0.8 % (ref 0.0–5.0)
HCT: 46 % (ref 36.0–46.0)
Hemoglobin: 15.1 g/dL — ABNORMAL HIGH (ref 12.0–15.0)
Lymphocytes Relative: 19.2 % (ref 12.0–46.0)
Lymphs Abs: 2.2 K/uL (ref 0.7–4.0)
MCHC: 33 g/dL (ref 30.0–36.0)
MCV: 96.3 fl (ref 78.0–100.0)
Monocytes Absolute: 0.8 K/uL (ref 0.1–1.0)
Monocytes Relative: 7.3 % (ref 3.0–12.0)
Neutro Abs: 8.3 K/uL — ABNORMAL HIGH (ref 1.4–7.7)
Neutrophils Relative %: 72.2 % (ref 43.0–77.0)
Platelets: 342 K/uL (ref 150.0–400.0)
RBC: 4.77 Mil/uL (ref 3.87–5.11)
RDW: 13.6 % (ref 11.5–15.5)
WBC: 11.6 K/uL — ABNORMAL HIGH (ref 4.0–10.5)

## 2024-05-24 LAB — AMYLASE: Amylase: 22 U/L — ABNORMAL LOW (ref 27–131)

## 2024-05-24 LAB — COMPREHENSIVE METABOLIC PANEL WITH GFR
ALT: 15 U/L (ref 3–35)
AST: 17 U/L (ref 5–37)
Albumin: 4.7 g/dL (ref 3.5–5.2)
Alkaline Phosphatase: 59 U/L (ref 39–117)
BUN: 11 mg/dL (ref 6–23)
CO2: 23 meq/L (ref 19–32)
Calcium: 9.2 mg/dL (ref 8.4–10.5)
Chloride: 102 meq/L (ref 96–112)
Creatinine, Ser: 0.69 mg/dL (ref 0.40–1.20)
GFR: 78.51 mL/min
Glucose, Bld: 96 mg/dL (ref 70–99)
Potassium: 4.4 meq/L (ref 3.5–5.1)
Sodium: 134 meq/L — ABNORMAL LOW (ref 135–145)
Total Bilirubin: 0.8 mg/dL (ref 0.2–1.2)
Total Protein: 8 g/dL (ref 6.0–8.3)

## 2024-05-24 LAB — LIPASE: Lipase: 7 U/L — ABNORMAL LOW (ref 11.0–59.0)

## 2024-05-24 MED ORDER — ONDANSETRON 4 MG PO TBDP: 4.0000 mg | ORAL_TABLET | Freq: Three times a day (TID) | ORAL | 0 refills | Status: AC | PRN

## 2024-05-24 NOTE — Patient Instructions (Addendum)
" ° ° ° ° °  Blood work was ordered.   Have a chest xray downstairs.    Medications changes include :   zofran  for nausea    A Ct abd/pelvis was ordered and someone will call you to schedule an appointment.     Return if symptoms worsen or fail to improve.  "

## 2024-05-24 NOTE — Telephone Encounter (Signed)
 Scan was done and completed today so not sure why TJ was calling for.

## 2024-05-24 NOTE — Telephone Encounter (Signed)
 FYI Only or Action Required?: FYI only for provider: appointment scheduled on 1/16.  Patient was last seen in primary care on 02/20/2024 by Purcell Emil Schanz, MD.  Called Nurse Triage reporting Fatigue.  Symptoms began a week ago.  Interventions attempted: Rest, hydration, or home remedies.  Symptoms are: unchanged.  Triage Disposition: See Physician Within 24 Hours  Patient/caregiver understands and will follow disposition?: Yes     Summary: Increased fatigue / nausea / no appetite   Reason for Triage: Increased fatigue. Patient has been sick for a week with nausea, no appetite, and increased fatigue, patient lays in bed most of the day.         Reason for Disposition  [1] MODERATE weakness (e.g., interferes with work, school, normal activities) AND [2] persists > 3 days  Answer Assessment - Initial Assessment Questions Patient's husband calling with concerns that patient has been fatigued and nauseated for 1 week without improvement.   1. DESCRIPTION: Describe how you are feeling.     Nauseated, no appetite, fatigue , patient c/o cold all the time 2. SEVERITY: How bad is it?  Can you stand and walk?     Patient spending most of her time in the bed 3. ONSET: When did these symptoms begin? (e.g., hours, days, weeks, months)     1 week 4. CAUSE: What do you think is causing the weakness or fatigue? (e.g., not drinking enough fluids, medical problem, trouble sleeping)     No sure 5. NEW MEDICINES:  Have you started on any new medicines recently? (e.g., opioid pain medicines, benzodiazepines, muscle relaxants, antidepressants, antihistamines, neuroleptics, beta blockers)     No new meds 6. OTHER SYMPTOMS: Do you have any other symptoms? (e.g., chest pain, fever, cough, SOB, vomiting, diarrhea, bleeding, other areas of pain)     no  Protocols used: Weakness (Generalized) and Fatigue-A-AH

## 2024-05-24 NOTE — Telephone Encounter (Signed)
 Copied from CRM [id number]. Topic: General - Other >> May 24, 2024 12:41 PM [name] [name] wrote: Reason for CRM: TJ from imaging department calling to obtain info as to why pt was sent over for CT scan before clarification at the imaging center. Pt had already drank the liquid and was not confirmed that she would be seen today. Please call TJ back on #[phone] ext [phone]

## 2024-05-24 NOTE — Progress Notes (Signed)
 "   Subjective:    Patient ID: [name] [name], female    DOB: December 26, 1935, 89 y.o.   MRN: [id number]      HPI Vernon is here for  Chief Complaint  Patient presents with   Fatigue    Fatigue and nausea   Discussed the use of AI scribe software for clinical note transcription with the patient, who gave verbal consent to proceed.  History of Present Illness [name] [name] is an 89 year old female with chronic constipation who presents with nausea and abdominal pain.  She is here today with her husband who helps supplement the history.  She has been experiencing intermittent nausea for the past two weeks, accompanied by a general feeling of malaise, poor appetite, and low energy levels, often necessitating rest during the day. Her fluid intake includes ginger ale and flavored water, but her food intake has been poor for the past week and a half.  She describes diffuse abdominal pain, primarily in the lower abdomen, as a soreness persisting for the past two weeks. She has a history of chronic constipation managed with Miralax , experiencing alternating constipation and diarrhea, with diarrhea occurring post-Miralax  use. This pattern has been present for years. She has a history of hiatal hernia undergoing stomach wrap surgery, which has contributed to her ongoing bowel issues.   No vomiting, heartburn, fever, headaches, dizziness, nasal congestion, sore throat, chest pain, palpitations, or pain with urination. She reports occasional lightheadedness and a little cough, which is not new. She has a history of bronchitis and experiences wheezing, which is normal for her. She also mentions having an overactive bladder but denies frequent urination, urgency, or urinary tract infections.  No blood in stool, black stool, or abnormal urine odor or appearance. She has not noticed any rashes. Her last bowel movement was recent but not significant.        Medications and allergies reviewed with  patient and updated if appropriate.  Medications Ordered Prior to Encounter[1]  Review of Systems  Constitutional:  Positive for appetite change (decreased) and fatigue. Negative for fever.  HENT:  Negative for congestion and sore throat.   Respiratory:  Positive for cough (minimal - chronic) and wheezing (a little - chronic). Negative for shortness of breath.   Cardiovascular:  Negative for chest pain, palpitations and leg swelling.  Gastrointestinal:  Positive for abdominal pain (diffuse), diarrhea and nausea (intermittent). Negative for blood in stool (no black stool), constipation and vomiting.       No gerd  Genitourinary:  Positive for frequency (chronic). Negative for dysuria and urgency.       Urine looks normal  Skin:  Negative for rash.  Neurological:  Positive for light-headedness (little). Negative for dizziness and headaches.       Objective:   Vitals:   05/24/24 1031  BP: 118/60  Pulse: 65  Temp: 97.8 F (36.6 C)  SpO2: 97%   BP Readings from Last 3 Encounters:  05/24/24 118/60  03/27/24 124/71  03/04/24 (!) 130/58   Wt Readings from Last 3 Encounters:  05/24/24 141 lb (64 kg)  03/14/24 147 lb (66.7 kg)  03/04/24 146 lb (66.2 kg)   Body mass index is 25.79 kg/m.    Physical Exam Constitutional:      General: She is not in acute distress.    Appearance: Normal appearance. She is ill-appearing (Mildly ill-appearing).  HENT:     Head: Normocephalic and atraumatic.  Cardiovascular:     Rate and Rhythm:  Normal rate and regular rhythm.  Pulmonary:     Effort: Pulmonary effort is normal.     Breath sounds: No wheezing or rales.     Comments: Diffusely decreased BS  Abdominal:     General: There is no distension.     Palpations: Abdomen is soft. There is no mass.     Tenderness: There is abdominal tenderness (across lower abdomen, LMQ). There is no guarding or rebound.     Hernia: No hernia is present.  Musculoskeletal:     Right lower leg: No edema.      Left lower leg: No edema.  Skin:    General: Skin is warm and dry.     Findings: No rash.  Neurological:     Mental Status: She is alert.        DG Chest 2 View CLINICAL DATA:  Simple chronic bronchitis.  EXAM: DG CHEST 2V  COMPARISON:  12/19/2023  FINDINGS: Chronic linear densities in the right lower lung. Probable scarring at the right lung apex. No new airspace disease or lung consolidation. Heart size is within normal limits and stable. Old vertebral body compression fractures with previous vertebral body augmentation procedures and surgical fusion in the upper lumbar spine. Evidence for a hiatal hernia.  IMPRESSION: 1. Chronic lung changes without acute findings. 2. Hiatal hernia.  Electronically Signed   By: Juliene Balder M.D.   On: 05/24/2024 14:46 CT ABDOMEN PELVIS WO CONTRAST CLINICAL DATA:  Six-month history of lower abdominal pain and nausea  EXAM: CT ABDOMEN AND PELVIS WITHOUT CONTRAST  TECHNIQUE: Multidetector CT imaging of the abdomen and pelvis was performed following the standard protocol without IV contrast.  RADIATION DOSE REDUCTION: This exam was performed according to the departmental dose-optimization program which includes automated exposure control, adjustment of the mA and/or kV according to patient size and/or use of iterative reconstruction technique.  COMPARISON:  CT abdomen and pelvis dated 08/05/2022 and multiple priors  FINDINGS: Lower chest: No focal consolidation or pulmonary nodule in the lung bases. No pleural effusion or pneumothorax demonstrated. Partially imaged heart size is normal.  Hepatobiliary: Unchanged subcentimeter segment 4 hypodensity (2:14), too small to characterize but likely a cyst. No intra or extrahepatic biliary ductal dilation. Cholecystectomy.  Pancreas: No focal lesions or main ductal dilation.  Spleen: Unchanged subcentimeter hypodensity in the inferior spleen (2:16), likely  benign.  Adrenals/Urinary Tract: No adrenal nodules. No suspicious renal mass, calculi or hydronephrosis. 1.2 cm simple cyst in the anterior left upper pole (2:18). No specific follow-up imaging recommended. Underdistended urinary bladder. Curvilinear radiodensity along the posterior aspect of the urethra (2:69), unchanged.  Stomach/Bowel: Postsurgical changes of fundoplication with small hiatal hernia. Otherwise normal appearance of the stomach. No evidence of bowel wall thickening, distention, or inflammatory changes. Colonic diverticulosis without acute diverticulitis. Appendix is not discretely seen.  Vascular/Lymphatic: Aortic atherosclerosis. No enlarged abdominal or pelvic lymph nodes.  Reproductive: No adnexal masses.  Other: No free fluid, fluid collection, or free air.  Pelvic laxity.  Musculoskeletal: No acute or abnormal lytic or blastic osseous lesions. Old healed fractures of the bilateral inferior and left superior pubic rami. Postsurgical changes of right femoral fixation. Partially imaged hardware appears intact. Multilevel degenerative changes of the partially imaged thoracic and lumbar spine. Postsurgical changes of L1-L3 spinal fusion. Hardware appears intact. Multiple prior thoracolumbar vertebral augmentation.  IMPRESSION: 1. No acute abdominopelvic findings. 2. Colonic diverticulosis without acute diverticulitis. 3. Postsurgical changes of fundoplication with small hiatal hernia. 4. Curvilinear metallic radiodensity along  the posterior aspect of the urethra, unchanged dating back to at least 12/20/2019. Recommend correlation with prior surgical intervention. 5.  Aortic Atherosclerosis (ICD10-I70.0).  Electronically Signed   By: Limin  Xu M.D.   On: 05/24/2024 14:16      Assessment & Plan:    See Problem List for Assessment and Plan of chronic medical problems.   Assessment and Plan Assessment & Plan Lower abdominal pain, nausea,  diarrhea Intermittent nausea, abdominal pain, and diarrhea for two weeks with fatigue and decreased appetite. Differential includes severe constipation with overflow diarrhea, gastroenteritis, colitis or less likely UTI. - Ordered CT scan of the abdomen. - Ordered blood work-CBC, CMP, amylase, lipase - zofran  4 mg Q 8 hr prn for nausea - Further treatment based on above results   Cough, wheeze - chronic bronchitis, hx PNA - Ordered chest x-ray to rule out pneumonia-she was concerned about this, but I do not think this matches her presentation, but gets reasonable to rule out.  Chronic constipation with overflow diarrhea Chronic constipation with episodes of overflow diarrhea managed with Miralax . Discussed daily management to prevent overflow diarrhea. - Advised daily use of Miralax , with option to adjust dose if too strong. - Educated on the importance of managing constipation to prevent overflow diarrhea.   Urinary frequency, OAB Chronic but with other symptoms need to r/o UTI - UA, Ucx      [1]  Current Outpatient Medications on File Prior to Visit  Medication Sig Dispense Refill   acetaminophen  (TYLENOL ) 325 MG tablet Take 2 tablets (650 mg total) by mouth every 6 (six) hours as needed for mild pain (pain score 1-3) or moderate pain (pain score 4-6) (or Fever >/= 101). 60 tablet 1   albuterol  (PROVENTIL ) (2.5 MG/3ML) 0.083% nebulizer solution Take 3 mLs (2.5 mg total) by nebulization every 4 (four) hours as needed for wheezing or shortness of breath. 75 mL 2   albuterol  (VENTOLIN  HFA) 108 (90 Base) MCG/ACT inhaler Inhale 2 puffs into the lungs as needed for wheezing or shortness of breath. 1 each 0   amLODipine  (NORVASC ) 10 MG tablet Take  1 tablet Daikly for BP & Heart                                                                        /                                                                   TAKE                                         BY                                                  MOUTH  90 tablet 3   aspirin  EC 81 MG tablet Take 81 mg by mouth at bedtime. Every other night     budesonide  (PULMICORT ) 0.25 MG/2ML nebulizer solution USE 1 VIAL IN NEBULIZER 4 TIMES DAILY 60 mL 0   busPIRone  (BUSPAR ) 10 MG tablet Take 1/2 to 1 tablet 3 x /day if Needed for Anxiety or Panic Attack  (( Replaces Xanax  )) 90 tablet 0   Cholecalciferol  (VITAMIN D3) 5000 units CAPS Take 5,000 Units by mouth 2 (two) times daily.      gabapentin  (NEURONTIN ) 300 MG capsule TAKE 1 CAPSULE BY MOUTH 4 TIMES DAILY AS NEEDED 120 capsule 0   hydrALAZINE  (APRESOLINE ) 25 MG tablet Take 1 tablet (25 mg total) by mouth 3 (three) times daily. 90 tablet 0   ketoconazole  (NIZORAL ) 2 % cream APPLY TO RINGWORM  RASH TWICE A DAY 60 g 0   Magnesium  400 MG TABS Take 400 mg by mouth daily at 2 PM.     melatonin 5 MG TABS Take 5 mg by mouth at bedtime as needed.     mometasone -formoterol  (DULERA ) 200-5 MCG/ACT AERO INHALE 2 PUFFS BY MOUTH IN THE MORNING AND AT BEDTIME 13 g 11   nitroGLYCERIN  (NITROSTAT ) 0.4 MG SL tablet Place 1 tablet (0.4 mg total) under the tongue every 5 (five) minutes as needed for chest pain. 90 tablet 3   pantoprazole  (PROTONIX ) 40 MG tablet Take  1 tablet  Daily  to Prevent Heartburn &  Indigestion 90 tablet 3   polyethylene glycol (MIRALAX  / GLYCOLAX ) 17 g packet Take 17 g by mouth daily. 14 each 0   pravastatin  (PRAVACHOL ) 20 MG tablet Take 1 tablet (20 mg total) by mouth every evening. 90 tablet 3   promethazine  (PHENERGAN ) 25 MG tablet Take  1 tablet   4 x / day  before Meals & Bedtime  as needed for Nausea 360 tablet 1   propranolol  (INDERAL ) 20 MG tablet Take 1 tablet (20 mg total) by mouth 2 (two) times daily. 180 tablet 3   rOPINIRole  (REQUIP ) 2 MG tablet Take 0.5 tablets (1 mg total) by mouth at bedtime. 30 tablet 0   senna-docusate (SENOKOT-S) 8.6-50 MG tablet Take 2 tablets by mouth 2 (two) times daily. 60 tablet 1   solifenacin (VESICARE) 5 MG tablet Take 5 mg by mouth  daily.     sucralfate  (CARAFATE ) 1 g tablet Take 1 g by mouth 4 (four) times daily -  with meals and at bedtime.     traMADol  (ULTRAM ) 50 MG tablet Take 1 tablet (50 mg total) by mouth 2 (two) times daily as needed. 60 tablet 2   vitamin B-12 (CYANOCOBALAMIN ) 1000 MCG tablet Take 5,000 mcg by mouth daily.     Current Facility-Administered Medications on File Prior to Visit  Medication Dose Route Frequency Provider Last Rate Last Admin   denosumab  (PROLIA ) injection 60 mg  60 mg Subcutaneous Once Persons, Ronal Dragon, PA       "

## 2024-05-25 LAB — URINE CULTURE: Result:: NO GROWTH

## 2024-06-03 ENCOUNTER — Other Ambulatory Visit (HOSPITAL_COMMUNITY): Payer: Self-pay

## 2024-06-03 ENCOUNTER — Telehealth: Payer: Self-pay | Admitting: Pharmacy Technician

## 2024-06-03 NOTE — Telephone Encounter (Signed)
 Pharmacy Patient Advocate Encounter   Received notification from [facility] KEY that prior authorization for Ondansetron  4MG  dispersible tablets is required/requested.   Insurance verification completed.   The patient is insured through [facility] ADVANTAGE/RX ADVANCE.   Per test claim: PA required; PA submitted to above mentioned insurance via Latent Key/confirmation #/EOC [id number] Status is pending

## 2024-06-04 ENCOUNTER — Other Ambulatory Visit: Payer: Self-pay | Admitting: Internal Medicine

## 2024-06-04 DIAGNOSIS — R300 Dysuria: Secondary | ICD-10-CM

## 2024-06-05 ENCOUNTER — Other Ambulatory Visit (HOSPITAL_COMMUNITY): Payer: Self-pay

## 2024-06-13 ENCOUNTER — Other Ambulatory Visit (HOSPITAL_COMMUNITY): Payer: Self-pay

## 2024-06-14 ENCOUNTER — Other Ambulatory Visit (HOSPITAL_COMMUNITY): Payer: Self-pay

## 2024-08-02 ENCOUNTER — Ambulatory Visit

## 2024-08-02 ENCOUNTER — Encounter: Admitting: Family Medicine
